# Patient Record
Sex: Female | Born: 1990
Health system: Southern US, Community
[De-identification: ages and names within clinical notes are randomized; demographics above are authoritative.]

## PROBLEM LIST (undated history)

## (undated) ENCOUNTER — Inpatient Hospital Stay (HOSPITAL_COMMUNITY): Payer: Self-pay

## (undated) DIAGNOSIS — B999 Unspecified infectious disease: Secondary | ICD-10-CM

## (undated) DIAGNOSIS — I10 Essential (primary) hypertension: Secondary | ICD-10-CM

## (undated) DIAGNOSIS — U071 COVID-19: Secondary | ICD-10-CM

## (undated) DIAGNOSIS — K3184 Gastroparesis: Secondary | ICD-10-CM

## (undated) DIAGNOSIS — F329 Major depressive disorder, single episode, unspecified: Secondary | ICD-10-CM

## (undated) DIAGNOSIS — T7840XA Allergy, unspecified, initial encounter: Secondary | ICD-10-CM

## (undated) DIAGNOSIS — E1065 Type 1 diabetes mellitus with hyperglycemia: Secondary | ICD-10-CM

## (undated) DIAGNOSIS — M797 Fibromyalgia: Secondary | ICD-10-CM

## (undated) DIAGNOSIS — F419 Anxiety disorder, unspecified: Secondary | ICD-10-CM

## (undated) DIAGNOSIS — Z5189 Encounter for other specified aftercare: Secondary | ICD-10-CM

## (undated) DIAGNOSIS — D649 Anemia, unspecified: Secondary | ICD-10-CM

## (undated) DIAGNOSIS — F32A Depression, unspecified: Secondary | ICD-10-CM

## (undated) DIAGNOSIS — H269 Unspecified cataract: Secondary | ICD-10-CM

## (undated) DIAGNOSIS — K219 Gastro-esophageal reflux disease without esophagitis: Secondary | ICD-10-CM

## (undated) HISTORY — DX: Anemia, unspecified: D64.9

## (undated) HISTORY — DX: Gastro-esophageal reflux disease without esophagitis: K21.9

## (undated) HISTORY — PX: WISDOM TOOTH EXTRACTION: SHX21

## (undated) HISTORY — DX: Major depressive disorder, single episode, unspecified: F32.9

## (undated) HISTORY — PX: AMPUTATION: SHX166

## (undated) HISTORY — DX: Fibromyalgia: M79.7

## (undated) HISTORY — DX: Allergy, unspecified, initial encounter: T78.40XA

## (undated) HISTORY — PX: ANKLE SURGERY: SHX546

## (undated) HISTORY — PX: COLONOSCOPY: SHX174

## (undated) HISTORY — DX: Unspecified cataract: H26.9

## (undated) HISTORY — DX: Encounter for other specified aftercare: Z51.89

## (undated) HISTORY — PX: UPPER GASTROINTESTINAL ENDOSCOPY: SHX188

## (undated) HISTORY — DX: Depression, unspecified: F32.A

## (undated) HISTORY — PX: SIGMOIDOSCOPY: SUR1295

---
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
## 2009-01-03 ENCOUNTER — Telehealth (INDEPENDENT_AMBULATORY_CARE_PROVIDER_SITE_OTHER): Payer: Self-pay | Admitting: *Deleted

## 2009-01-04 ENCOUNTER — Telehealth: Payer: Self-pay | Admitting: Family Medicine

## 2009-01-26 ENCOUNTER — Encounter: Payer: Self-pay | Admitting: Family Medicine

## 2009-01-26 ENCOUNTER — Encounter: Payer: Self-pay | Admitting: Internal Medicine

## 2009-01-26 ENCOUNTER — Encounter (INDEPENDENT_AMBULATORY_CARE_PROVIDER_SITE_OTHER): Payer: Self-pay | Admitting: *Deleted

## 2009-01-26 LAB — CONVERTED CEMR LAB
Cholesterol: 133 mg/dL
HDL: 57 mg/dL
LDL Cholesterol: 62 mg/dL
Triglycerides: 72 mg/dL

## 2009-02-02 ENCOUNTER — Encounter (INDEPENDENT_AMBULATORY_CARE_PROVIDER_SITE_OTHER): Payer: Self-pay | Admitting: *Deleted

## 2009-08-10 ENCOUNTER — Encounter: Payer: Self-pay | Admitting: Internal Medicine

## 2009-08-10 ENCOUNTER — Encounter: Payer: Self-pay | Admitting: Family Medicine

## 2009-08-10 ENCOUNTER — Encounter (INDEPENDENT_AMBULATORY_CARE_PROVIDER_SITE_OTHER): Payer: Self-pay | Admitting: *Deleted

## 2009-08-10 LAB — CONVERTED CEMR LAB: Hgb A1c MFr Bld: 14.8 %

## 2009-08-13 ENCOUNTER — Encounter (INDEPENDENT_AMBULATORY_CARE_PROVIDER_SITE_OTHER): Payer: Self-pay | Admitting: *Deleted

## 2009-12-23 ENCOUNTER — Emergency Department (HOSPITAL_COMMUNITY): Admission: EM | Admit: 2009-12-23 | Discharge: 2009-12-23 | Payer: Self-pay | Admitting: Emergency Medicine

## 2010-01-01 ENCOUNTER — Ambulatory Visit: Payer: Self-pay | Admitting: Internal Medicine

## 2010-01-01 DIAGNOSIS — K5900 Constipation, unspecified: Secondary | ICD-10-CM | POA: Insufficient documentation

## 2010-01-01 DIAGNOSIS — K6289 Other specified diseases of anus and rectum: Secondary | ICD-10-CM | POA: Insufficient documentation

## 2010-01-04 ENCOUNTER — Telehealth (INDEPENDENT_AMBULATORY_CARE_PROVIDER_SITE_OTHER): Payer: Self-pay | Admitting: *Deleted

## 2010-02-14 ENCOUNTER — Encounter (INDEPENDENT_AMBULATORY_CARE_PROVIDER_SITE_OTHER): Payer: Self-pay | Admitting: *Deleted

## 2010-02-14 ENCOUNTER — Encounter: Payer: Self-pay | Admitting: Family Medicine

## 2010-02-14 LAB — CONVERTED CEMR LAB: Hgb A1c MFr Bld: 12.2 %

## 2010-02-15 ENCOUNTER — Encounter (INDEPENDENT_AMBULATORY_CARE_PROVIDER_SITE_OTHER): Payer: Self-pay | Admitting: *Deleted

## 2010-04-30 NOTE — Letter (Signed)
Summary: [facility]  [facility]   Imported By: Lanelle Bal 04/05/2008 10:34:52  _____________________________________________________________________  External Attachment:    Type:   Image     Comment:   External Document

## 2010-04-30 NOTE — Progress Notes (Signed)
Summary: VERIFICATION OF DOSAGE  Phone Note Call from Patient   Caller: [facility] on [address] [phone] Summary of Call: pt is in store now, to lpickup LEVAQUIN RX SAYS 500MG  two times a day , NEVERG VIEN two times a day A HIGH DOSE DOSE, WE NEED TO VERIFY BEFORE GIVEN TO PT  Follow-up for Phone Call        spoke with pharm it was changed to once daily.  Follow-up by: Army Fossa CMA,  January 03, 2009 4:35 PM

## 2010-04-30 NOTE — Letter (Signed)
Summary: [facility]  [facility]   Imported By: Lanelle Bal 09/27/2008 15:42:40  _____________________________________________________________________  External Attachment:    Type:   Image     Comment:   External Document

## 2010-04-30 NOTE — Miscellaneous (Signed)
  Clinical Lists Changes  Observations: Added new observation of TD BOOSTER: Historical (10/05/2003 15:12)      Tetanus/Td Immunization History:    Tetanus/Td # 1:  Historical (10/05/2003)

## 2010-04-30 NOTE — Assessment & Plan Note (Signed)
Summary: SORE ON ARM PT DIABETIC/ALR   Vital Signs:  Patient profile:   20 year old female Height:      66.75 inches Weight:      173.25 pounds BMI:     27.44 Temp:     98.3 degrees F oral Pulse rate:   92 / minute Pulse rhythm:   regular BP sitting:   138 / 90  (left arm) Cuff size:   regular  Vitals Entered By: Allyn Kenner CMA (December 29, 2008 3:49 PM) CC: sore on right forearm x 1 week.    History of Present Illness: Pt here with mom c/o sore on R forearm x 1 week.  No injury.  Pt had abscess on buttocks a while back and surgeon removed it.   Current Medications (verified): 1)  Metformin Hcl 1000 Mg Tabs (Metformin Hcl) .... Take One Tablet Two Times A Day 2)  Enalapril Maleate 5 Mg Tabs (Enalapril Maleate) .... Take One Tablet Daily 3)  Hydrocodone-Acetaminophen 5-500 Mg Tabs (Hydrocodone-Acetaminophen) .... Take One Tablet Every 6 Hours For Pain 4)  Novolog 100 Unit/ml Soln (Insulin Aspart) .... Sliding Scale Subq As Needed Sliding Scale As Before Meals 5)  Lantus 100 Unit/ml Soln (Insulin Glargine) .... 60 Units At Bedtime 6)  Potassium Phospate 7)  Bactrim Ds 800-160 Mg Tabs (Sulfamethoxazole-Trimethoprim) .Marland Kitchen.. 1 Tablet Two Times A Day  Allergies (verified): No Known Drug Allergies  Past History:  Past medical, surgical, family and social histories (including risk factors) reviewed for relevance to current acute and chronic problems.  Past Medical History: Reviewed history from 02/07/2008 and no changes required. Diabetes type 2  Past Surgical History: Reviewed history from 07/20/2008 and no changes required. 10/06/06-DKA 4/10- DKA  Family History: Reviewed history and no changes required.  Social History: Reviewed history from 02/07/2008 and no changes required. Freshman at [employer] (2009)  Review of Systems      See HPI  Physical Exam  General:  Well-developed,well-nourished,in no acute distress; alert,appropriate and cooperative throughout  examination Skin:  R forearm 2in abscess with surrounding induration and errythema culture done tender to touch Psych:  Oriented X3 and normally interactive.     Impression & Recommendations:  Problem # 1:  ABSCESS, ARM (IZT-245.3)  rocephin given referral coordator called hand surgeon who said to send pt to ER and pt would see Dr Apolonio Schneiders.  Her updated medication list for this problem includes:    Bactrim Ds 800-160 Mg Tabs (Sulfamethoxazole-trimethoprim) .Marland Kitchen... 1 tablet two times a day  Elevate affected area. Warm moist compresses for 20 minutes every 2 hours while awake. Take antibiotics as directed and take acetaminophen as needed. To be seen in 48-72 hours if no improvement, sooner if worse.  Orders: Surgical Referral (Surgery) T-Culture, Wound ([id number]) Admin of Therapeutic Inj  intramuscular or subcutaneous (80998) Rocephin  227m (([id number]  Complete Medication List: 1)  Metformin Hcl 1000 Mg Tabs (Metformin hcl) .... Take one tablet two times a day 2)  Enalapril Maleate 5 Mg Tabs (Enalapril maleate) .... Take one tablet daily 3)  Hydrocodone-acetaminophen 5-500 Mg Tabs (Hydrocodone-acetaminophen) .... Take one tablet every 6 hours for pain 4)  Novolog 100 Unit/ml Soln (Insulin aspart) .... Sliding scale subq as needed sliding scale as before meals 5)  Lantus 100 Unit/ml Soln (Insulin glargine) .... 60 units at bedtime 6)  Potassium Phospate  7)  Bactrim Ds 800-160 Mg Tabs (Sulfamethoxazole-trimethoprim) ..Marland Kitchen. 1 tablet two times a day Prescriptions: BACTRIM DS 800-160 MG TABS (SULFAMETHOXAZOLE-TRIMETHOPRIM) 1 tablet  two times a day  #20 x 1   Entered and Authorized by:   Garnet Koyanagi DO   Signed by:   Garnet Koyanagi DO on 12/29/2008   Method used:   Electronically to        [facility] (retail)       [address]       [place], [place]  [address]       Ph: [phone]       Fax: [phone]   RxID:   [id number]    Medication  Administration  Injection # 1:    Medication: Rocephin  269m    Diagnosis: ABSCESS, ARM ([id number])    Route: IM    Site: RUOQ gluteus    Exp Date: 06/2011    Lot #: [id number]   Mfr: NOVAPLUS    Comments: Pt received 1 gm     Patient tolerated injection without complications    Given by: DAllyn KennerCMA (December 29, 2008 4:37 PM)  Orders Added: 1)  Surgical Referral [Surgery] 2)  T-Culture, Wound [[id number]] 3)  Admin of Therapeutic Inj  intramuscular or subcutaneous [96372] 4)  Rocephin  2532m[[id number]] 5)  Est. Patient Level III [[id number][[id number]]

## 2010-04-30 NOTE — Miscellaneous (Signed)
  Clinical Lists Changes  Observations: Added new observation of TRIGLYC TOT: 72 mg/dL (44/03/270 53:66) Added new observation of LDL: 62 mg/dL (44/05/4740 59:56) Added new observation of HDL: 57 mg/dL (38/75/6433 29:51) Added new observation of CHOLESTEROL: 133 mg/dL (88/41/6606 30:16)

## 2010-04-30 NOTE — Consult Note (Signed)
Summary: [facility]-OFFICE PROGRESS NOTE  [facility]-OFFICE PROGRESS NOTE   Imported By: Freddy Jaksch 02/22/2007 17:19:01  _____________________________________________________________________  External Attachment:    Type:   Image     Comment:   INTER

## 2010-04-30 NOTE — Assessment & Plan Note (Signed)
Summary: followup from hospital/alr   Vital Signs:  Patient profile:   20 year old female Weight:      192.4 pounds Pulse rate:   114 / minute BP sitting:   134 / 80  (left arm)  Vitals Entered By: Doristine Devoid (July 20, 2008 2:59 PM) CC: hosp f/u and recheck abscess on right buttocks   History of Present Illness: 20 yo girl recently d/c'd from [facility] hospital for DKA on 4/16.  was not taking insulin.  pt still following w/ endo at [facility] but having difficulty dealing w/ DM while in college.  pt w/ abscess in gluteal cleft.  on Bactrim.  packing in place.  pt has home health changing packing every other day and mom doing it on the off days.  no surrounding redness or induration per mom.  pain much less. still has pain meds left.   Current Medications (verified): 1)  Metformin Hcl 1000 Mg Tabs (Metformin Hcl) .... Take One Tablet Two Times A Day 2)  Enalapril Maleate 5 Mg Tabs (Enalapril Maleate) .... Take One Tablet Daily 3)  Hydrocodone-Acetaminophen 5-500 Mg Tabs (Hydrocodone-Acetaminophen) .... Take One Tablet Every 6 Hours For Pain 4)  Novolog 100 Unit/ml Soln (Insulin Aspart) .... Sliding Scale Subq As Needed Sliding Scale As Before Meals 5)  Lantus 100 Unit/ml Soln (Insulin Glargine) .... 60 Units At Bedtime 6)  Potassium Phospate 7)  Bactrim Ds 800-160 Mg Tabs (Sulfamethoxazole-Trimethoprim) .Marland Kitchen.. 1 Tablet Two Times A Day  Allergies (verified): No Known Drug Allergies  Past History:  Past Medical History:    Diabetes type 2 (02/07/2008)  Past Surgical History:    10/06/06-DKA    4/10- DKA  Review of Systems General:  Denies chills, fatigue, fever, loss of appetite, and malaise. Eyes:  Denies blurring and double vision. CV:  Denies chest pain or discomfort, lightheadness, near fainting, palpitations, shortness of breath with exertion, swelling of feet, and swelling of hands. GI:  Denies abdominal pain, nausea, and vomiting. Derm:  Complains of lesion(s). Psych:   Denies anxiety and depression. Endo:  Denies excessive hunger, excessive thirst, and excessive urination.  Physical Exam  General:  Well-developed,well-nourished,in no acute distress; alert,appropriate and cooperative throughout examination Skin:  1-1.5 cm opening at gluteal cleft.  no surrounding erythema or induration.  bottom of wound not visible.   Impression & Recommendations:  Problem # 1:  DIABETES MELLITUS, TYPE II (ICD-250.00) Assessment Unchanged had long discussion w/ pt (37 minutes) regarding the importance of taking care of her diabetes despite being independent and wanting to 'be normal'.  after this hospitalization pt states she is now aware that she needs to take better care of herself.  reports taking insulin daily as directed.  now wants career in health care- stressed she needs to take care of herself before caring for others.  pt understands.  more than 50% of visit spent counseling. Her updated medication list for this problem includes:    Metformin Hcl 1000 Mg Tabs (Metformin hcl) .Marland Kitchen... Take one tablet two times a day    Enalapril Maleate 5 Mg Tabs (Enalapril maleate) .Marland Kitchen... Take one tablet daily    Novolog 100 Unit/ml Soln (Insulin aspart) ..... Sliding scale subq as needed sliding scale as before meals    Lantus 100 Unit/ml Soln (Insulin glargine) .Marland KitchenMarland KitchenMarland KitchenMarland Kitchen 60 units at bedtime  Problem # 2:  CELLULITIS/ABSCESS, BUTTOCK (ICD-682.5) Assessment: New area cleaned w/ normal saline and packed w/ sterile saline gauze.  stressed importance of finishing abx.  reviewed red  flags that should prompt return.  pt and mother express understanding. Her updated medication list for this problem includes:    Bactrim Ds 800-160 Mg Tabs (Sulfamethoxazole-trimethoprim) .Marland Kitchen... 1 tablet two times a day  Complete Medication List: 1)  Metformin Hcl 1000 Mg Tabs (Metformin hcl) .... Take one tablet two times a day 2)  Enalapril Maleate 5 Mg Tabs (Enalapril maleate) .... Take one tablet daily 3)   Hydrocodone-acetaminophen 5-500 Mg Tabs (Hydrocodone-acetaminophen) .... Take one tablet every 6 hours for pain 4)  Novolog 100 Unit/ml Soln (Insulin aspart) .... Sliding scale subq as needed sliding scale as before meals 5)  Lantus 100 Unit/ml Soln (Insulin glargine) .... 60 units at bedtime 6)  Potassium Phospate  7)  Bactrim Ds 800-160 Mg Tabs (Sulfamethoxazole-trimethoprim) .Marland Kitchen.. 1 tablet two times a day  Patient Instructions: 1)  Please start taking care of yourself!  This is VERY important! 2)  Finish your antibiotics! 3)  Continue to keep the abscess area clean and dry 4)  Tylenol or ibuprofen as needed for pain- save the hydrocodone for severe pain 5)  Call with any questions or concerns 6)  Remember- you have to take care of you in order to take care of others!

## 2010-04-30 NOTE — Letter (Signed)
Summary: [facility]--CHEMISTRY RESULT  [facility]--CHEMISTRY RESULT   Imported By: Freddy Jaksch 02/22/2007 17:18:14  _____________________________________________________________________  External Attachment:    Type:   Image     Comment:   External Document

## 2010-04-30 NOTE — Assessment & Plan Note (Signed)
Summary: very constipated//kn   Vital Signs:  Patient profile:   20 year old female Height:      66.75 inches Weight:      158.25 pounds BMI:     25.06 Pulse rate:   152 / minute Pulse rhythm:   regular BP sitting:   126 / 82  (left arm) Cuff size:   regular  Vitals Entered By: Army Fossa CMA (January 01, 2010 3:20 PM) CC: Pt here for severe constipation. Comments Had a bowel movement yesterday, very painful Has been on percocet. Pharm- [facility] [address]   History of Present Illness: approximately 10 days ago went to the ER with a vaginal infection, she was prescribed antibiotics and Percocet due to pain. She took Percocet for 4 days then the  prescription ran out Vaginal infection better 5 days ago she noticed she was  constipated, symptoms progressively worse,having some pain with BMs.  Last night, she had a very small BM, very hard and for the first time she saw some fresh blood in the bowl . This morning she has not been able to have a BM, she feels like she could but she is holding it because of the pain.  ROS No history of previous constipation No abdominal pain No fever No nausea vomiting not on birth control pills, last menstrual period approximately 10 days ago  Current Medications (verified): 1)  Metformin Hcl 1000 Mg Tabs (Metformin Hcl) .... Take One Tablet Two Times A Day 2)  Enalapril Maleate 5 Mg Tabs (Enalapril Maleate) .... Take One Tablet Daily 3)  Novolog 100 Unit/ml Soln (Insulin Aspart) .... Sliding Scale Subq As Needed Sliding Scale As Before Meals 4)  Lantus 100 Unit/ml Soln (Insulin Glargine) .... 60 Units At Bedtime  Allergies (verified): No Known Drug Allergies  Past History:  Past Medical History: Diabetes type 2-- per endocrinology  Past Surgical History: Reviewed history from 07/20/2008 and no changes required. 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   Abdomen:  soft, non-tender, no distention, no masses, no guarding, and no rigidity.   Rectal:  no external hemorrhoids noted. No active bleeding. Digital rectal exam show a normal sphincter tone, no mass noted, stools were brown,soft and  non-bloody. The patient had severe pain throughout the examination   Impression & Recommendations:  Problem # 1:  RECTAL PAIN (ZOX-096.04) severe rectal pain in the setting of recent constipation likely due to painkillers On the rectal examination, I did not see any hemorrhoids, no mass or obvious abscess. The patient was complaining of pain consequently I did not proceed with an anoscopy. Suspect she has a fissure. Plan, see instructions  Problem # 2:  CONSTIPATION (ICD-564.00) see instructions  Complete Medication List: 1)  Metformin Hcl 1000 Mg Tabs (Metformin hcl) .... Take one tablet two times a day 2)  Enalapril Maleate 5 Mg Tabs (Enalapril maleate) .... Take one tablet daily 3)  Novolog 100 Unit/ml Soln (Insulin aspart) .... Sliding scale subq as needed sliding scale as before meals 4)  Lantus 100 Unit/ml Soln (Insulin glargine) .... 60 units at bedtime  Patient Instructions: 1)  no more pain medicine 2)  MiraLax over-the-counter 17 g daily every day for 10 days 3)  You may need to use milk of magnesia over-the-counter 10 cc 3 times a day as needed as well if the MiraLax is not working 24 hours  4)  Get over the counter nupercainal and apply it in and around the anus 3 times a day for the next 10 days 5)  call or go to the ER if you have abdominal pain, fever, nausea vomiting or you are not  better in the next few days

## 2010-04-30 NOTE — Letter (Signed)
Summary: External Correspondence-FOLLOW UP   External Correspondence-FOLLOW UP   Imported By: Freddy Jaksch 10/27/2006 09:48:08  _____________________________________________________________________  External Attachment:    Type:   Image     Comment:   External Document

## 2010-04-30 NOTE — Progress Notes (Signed)
Summary: Verify meds   Phone Note Call from Patient   Summary of Call: Would like Marda to take the [place] for 10 days? And just once a day correct? She is seeing [facility] ortho daily and they are doing water therapy on her wound.  Initial call taken by: Fabens,  January 04, 2009 8:00 AM  Follow-up for Phone Call        did they change the medication---if not then she should take levaquin.    Follow-up by: Garnet Koyanagi DO,  January 04, 2009 10:08 AM  Additional Follow-up for Phone Call Additional follow up Details #1::        medication changed.  Additional Follow-up by: Allyn Kenner CMA,  January 04, 2009 10:27 AM    New/Updated Medications: LEVAQUIN 500 MG TABS (LEVOFLOXACIN) 1 by mouth once daily.

## 2010-04-30 NOTE — Progress Notes (Signed)
  Phone Note Outgoing Call   Summary of Call: Spoke with pt, she is doing better. She is being seen at [facility] ortho. Medication changed levaquin 500mg .     New/Updated Medications: LEVAQUIN 500 MG TABS (LEVOFLOXACIN) 1 by mouth two times a day Prescriptions: LEVAQUIN 500 MG TABS (LEVOFLOXACIN) 1 by mouth two times a day  #20 x 0   Entered by:   Army Fossa CMA   Authorized by:   Loreen Freud DO   Signed by:   Army Fossa CMA on 01/03/2009   Method used:   Electronically to        [facility] (retail)       [address]       [place], [place]  [address]       Ph: [phone]       Fax: [phone]   RxID:   [id number]

## 2010-04-30 NOTE — Letter (Signed)
Summary: [facility]--Office Visit  [facility]--Office Visit   Imported By: Freddy Jaksch 05/28/2007 11:44:20  _____________________________________________________________________  External Attachment:    Type:   Image     Comment:   External Document

## 2010-04-30 NOTE — Miscellaneous (Signed)
  Clinical Lists Changes  Observations: Added new observation of HGBA1C: 14.8 % (08/10/2009 10:52)

## 2010-04-30 NOTE — Letter (Signed)
Summary: endocrinology------[facility]  [facility]   Imported By: Freddy Jaksch 10/05/2007 09:17:01  _____________________________________________________________________  External Attachment:    Type:   Image     Comment:   External Document

## 2010-04-30 NOTE — Assessment & Plan Note (Signed)
Summary: acute only for flu like symptom//ph   Vital Signs:  Patient Profile:   20 Years Old Female Weight:      234.6 pounds Temp:     99.2 degrees F oral BP sitting:   120 / 72  (left arm)  Vitals Entered By: Doristine Devoid (February 07, 2008 1:21 PM)                 Chief Complaint:  fever body aches and chills x3 days .  History of Present Illness: 20 yo girl w/ HA, alternating subjective fevers and chills Saturday night.  Tmax 102.4  + fatigue.  + body aches.  No N/V.  + cough- productive of clear mucus.  + nasal congestion.  + sick contacts at college, confirmed flu.  Denies SOB.    Current Allergies: No known allergies   Past Medical History:    Diabetes type 2  Past Surgical History:    10/06/06-DKA   Social History:    Freshman at [employer] (2009)    Review of Systems      See HPI   Physical Exam  General:     Well-developed,well-nourished,in no acute distress; alert,appropriate and cooperative throughout examination Head:     Normocephalic and atraumatic without obvious abnormalities. No apparent alopecia or balding. Eyes:     no injection or inflammation Ears:     External ear exam shows no significant lesions or deformities.  Otoscopic examination reveals clear canals, tympanic membranes are intact bilaterally without bulging, retraction, inflammation or discharge. Hearing is grossly normal bilaterally. Nose:     clear rhinorrhea Mouth:     post pharynx red, no tonsilar erythema or exudates Neck:     No deformities, masses, or tenderness noted. Lungs:     Normal respiratory effort, chest expands symmetrically. Lungs are clear to auscultation, no crackles or wheezes. Heart:     tachy S1/S2, no M/R/G    Impression & Recommendations:  Problem # 1:  INFLUENZA (ICD-487.8) Assessment: New Pt w/ + flu test in office.  As sxs began <48 hrs ago will start Tamiflu.  Reviewed supportive care and red flags that should prompt return.  Pt  expresses understanding and is in agreement w/ this plan. Her updated medication list for this problem includes:    Tamiflu 75 Mg Caps (Oseltamivir phosphate) .Marland Kitchen... 1 tab by mouth two times a day x 5 days  Orders: Flu A+B ([address])   Complete Medication List: 1)  Metformin Hcl 1000 Mg Tabs (Metformin hcl) .... Take one tablet two times a day 2)  Enalapril  .... Unsure of dose 3)  Tamiflu 75 Mg Caps (Oseltamivir phosphate) .Marland Kitchen.. 1 tab by mouth two times a day x 5 days   Patient Instructions: 1)  You have the Flu 2)  Take the Tamiflu as directed 3)  Drink LOTS of fluid! 4)  Take Tylenol and/or Ibuprofen as needed for fevers and body aches 5)  If you develop any breathing problems- please call the office and/or go to ER right away!   Prescriptions: TAMIFLU 75 MG CAPS (OSELTAMIVIR PHOSPHATE) 1 tab by mouth two times a day x 5 days  #10 x 0   Entered and Authorized by:   Neena Rhymes MD   Signed by:   Neena Rhymes MD on 02/07/2008   Method used:   Print then Give to Patient   RxID:   [id number]  ] Laboratory Results    Other Tests  Influenza A: positive Influenza B:  positive

## 2010-04-30 NOTE — Letter (Signed)
Summary: External Correspondence-[facility]  External Correspondence-[facility]   Imported By: Vanessa Swaziland 02/09/2007 10:56:49  _____________________________________________________________________  External Attachment:    Type:   Image     Comment:   External Document

## 2010-04-30 NOTE — Progress Notes (Signed)
Summary: Checking on pt (lmom 10/7, 10/10, 10/12)  ---- Converted from flag ---- ---- 01/02/2010 5:27 PM, Jose E. Paz MD wrote: please check on the patient, doing better? ------------------------------  Left message for pt to call back. Army Fossa CMA  January 04, 2010 10:37 AM  Left message for pt to call back. Army Fossa CMA  January 07, 2010 3:05 PM  Left message for pt to call back. Army Fossa CMA  January 09, 2010 2:21 PM  Pt never returned call. Army Fossa CMA  January 10, 2010 10:42 AM

## 2010-04-30 NOTE — Miscellaneous (Signed)
  Clinical Lists Changes  Observations: Added new observation of HGBA1C: 12.2 % (02/14/2010 9:20)  Appended Document:     Clinical Lists Changes  Observations: Added new observation of BILI TOTAL: 0.4 mg/dL (37/12/6267 48:54) Added new observation of ALK PHOS: 85 units/L (02/15/2010 11:19) Added new observation of SGPT (ALT): 16 units/L (02/15/2010 11:19) Added new observation of SGOT (AST): 20 units/L (02/15/2010 11:19) Added new observation of PROTEIN, TOT: 0.4 g/dL (62/70/3500 93:81) Added new observation of ALBUMIN: 3.8 g/dL (82/99/3716 96:78) Added new observation of CALCIUM: 9.6 mg/dL (93/81/0175 10:25) Added new observation of GLUCOSE SER: 138 mg/dL (85/27/7824 23:53) Added new observation of CREATININE: 0.5 mg/dL (61/44/3154 00:86) Added new observation of BUN: 9 mg/dL (76/19/5093 26:71) Added new observation of CO2 TOTAL: 27 mmol/L (02/15/2010 11:19) Added new observation of CHLORIDE: 101 mmol/L (02/15/2010 11:19) Added new observation of POTASSIUM: 3.0 mmol/L (02/15/2010 11:19) Added new observation of SODIUM: 137 mmol/L (02/15/2010 11:19) Added new observation of TRIGLYC TOT: 74 mg/dL (24/58/0998 33:82) Added new observation of LDL: 111 mg/dL (50/53/9767 34:19) Added new observation of HDL: 72 mg/dL (37/90/2409 73:53) Added new observation of CHOLESTEROL: 198 mg/dL (29/92/4268 34:19)

## 2010-06-04 ENCOUNTER — Telehealth: Payer: Self-pay | Admitting: Family Medicine

## 2010-06-04 DIAGNOSIS — E1065 Type 1 diabetes mellitus with hyperglycemia: Secondary | ICD-10-CM | POA: Insufficient documentation

## 2010-06-04 DIAGNOSIS — E108 Type 1 diabetes mellitus with unspecified complications: Secondary | ICD-10-CM

## 2010-06-11 NOTE — Progress Notes (Signed)
Summary: Refill--Lantus  Phone Note Refill Request Message from:  Patient on June 04, 2010 1:27 PM  Refills Requested: Medication #1:  LANTUS 100 UNIT/ML SOLN 60 units at bedtime. expres scripts - 90 day supply  Initial call taken by: Okey Regal Spring,  June 04, 2010 1:29 PM  Follow-up for Phone Call        This patient last saw you in 2010. Please advise. Lucious Groves CMA  June 05, 2010 8:52 AM   Additional Follow-up for Phone Call Additional follow up Details #1::        she should get her diabetes meds from [facility] b/c they are the ones treating her diabetes Additional Follow-up by: Neena Rhymes MD,  June 05, 2010 9:07 AM  New Problems: DIAB W/UNSPEC COMP TYPE I [JUV TYPE] UNCNTRL (ICD-250.93)   Additional Follow-up for Phone Call Additional follow up Details #2::    No call back number listed, I called home number and spoke with her mother.  Mother is aware that patient will be referred to endocrinology, but needs med in the mean time. She notes that the patient will want to see a woman MD only. Please advise. Lucious Groves CMA  June 05, 2010 9:58 AM   Additional Follow-up for Phone Call Additional follow up Details #3:: Details for Additional Follow-up Action Taken: will refer to female endo at pt's request but given her difficult to control type I diabetes I will not be able to treat her for this.  will provide 1 month supply of meds at local pharmacy so that she can get appt.  please send med to local pharmacy once mom identifies one. Additional Follow-up by: Neena Rhymes MD,  June 05, 2010 11:06 AM  New Problems: DIAB W/UNSPEC COMP TYPE I [JUV TYPE] UNCNTRL (ICD-250.93) Prescriptions: LANTUS 100 UNIT/ML SOLN (INSULIN GLARGINE) 60 units at bedtime  #1 month x 0   Entered by:   Lucious Groves CMA   Authorized by:   Neena Rhymes MD   Signed by:   Lucious Groves CMA on 06/05/2010   Method used:   Electronically to        [facility] (retail)       [address]       [place], [place]  [address]       Ph: [phone]       Fax: [phone]   RxID:   [id number]  Mom notified and states she will schedule endo appt herself. Lucious Groves CMA  June 05, 2010 11:51 AM

## 2010-06-13 LAB — BASIC METABOLIC PANEL
BUN: 4 mg/dL — ABNORMAL LOW (ref 6–23)
CO2: 20 mEq/L (ref 19–32)
Calcium: 9 mg/dL (ref 8.4–10.5)
Chloride: 97 mEq/L (ref 96–112)
Creatinine, Ser: 0.63 mg/dL (ref 0.4–1.2)
GFR calc Af Amer: >60 mL/min (ref >60)
GFR calc non Af Amer: >60 mL/min (ref >60)
Glucose, Bld: 441 mg/dL — ABNORMAL HIGH (ref 70–99)
Potassium: 3.2 mEq/L — ABNORMAL LOW (ref 3.5–5.1)
Sodium: 133 mEq/L — ABNORMAL LOW (ref 135–145)

## 2010-06-13 LAB — WET PREP, GENITAL
Trich, Wet Prep: NONE SEEN
Yeast Wet Prep HPF POC: NONE SEEN

## 2010-06-13 LAB — POCT PREGNANCY, URINE: Preg Test, Ur: NEGATIVE

## 2010-06-13 LAB — GLUCOSE, CAPILLARY
Glucose-Capillary: 256 mg/dL — ABNORMAL HIGH (ref 70–99)
Glucose-Capillary: 394 mg/dL — ABNORMAL HIGH (ref 70–99)

## 2010-07-02 ENCOUNTER — Other Ambulatory Visit: Payer: Self-pay | Admitting: Internal Medicine

## 2010-07-02 DIAGNOSIS — N6312 Unspecified lump in the right breast, upper inner quadrant: Secondary | ICD-10-CM

## 2010-07-02 DIAGNOSIS — N6325 Unspecified lump in the left breast, overlapping quadrants: Secondary | ICD-10-CM

## 2010-07-04 LAB — POCT I-STAT, CHEM 8
BUN: <3 mg/dL — ABNORMAL LOW (ref 6–23)
Calcium, Ion: 1.21 mmol/L (ref 1.12–1.32)
Chloride: 104 mEq/L (ref 96–112)
Creatinine, Ser: 0.5 mg/dL (ref 0.4–1.2)
Glucose, Bld: 424 mg/dL — ABNORMAL HIGH (ref 70–99)
HCT: 41 % (ref 36.0–46.0)
Hemoglobin: 13.9 g/dL (ref 12.0–15.0)
Potassium: 3.1 mEq/L — ABNORMAL LOW (ref 3.5–5.1)
Sodium: 140 mEq/L (ref 135–145)
TCO2: 19 mmol/L (ref 0–100)

## 2010-07-04 LAB — CBC
HCT: 38.8 % (ref 36.0–46.0)
Hemoglobin: 13.3 g/dL (ref 12.0–15.0)
MCHC: 34.4 g/dL (ref 30.0–36.0)
MCV: 92.1 fL (ref 78.0–100.0)
Platelets: 339 10*3/uL (ref 150–400)
RBC: 4.21 MIL/uL (ref 3.87–5.11)
RDW: 15 % (ref 11.5–15.5)
WBC: 8.4 10*3/uL (ref 4.0–10.5)

## 2010-07-04 LAB — DIFFERENTIAL
Basophils Absolute: 0.1 10*3/uL (ref 0.0–0.1)
Basophils Relative: 1 % (ref 0–1)
Eosinophils Absolute: 0.1 10*3/uL (ref 0.0–0.7)
Eosinophils Relative: 1 % (ref 0–5)
Lymphocytes Relative: 16 % (ref 12–46)
Lymphs Abs: 1.4 10*3/uL (ref 0.7–4.0)
Monocytes Absolute: 0.7 10*3/uL (ref 0.1–1.0)
Monocytes Relative: 8 % (ref 3–12)
Neutro Abs: 6.2 10*3/uL (ref 1.7–7.7)
Neutrophils Relative %: 73 % (ref 43–77)

## 2010-07-05 ENCOUNTER — Ambulatory Visit
Admission: RE | Admit: 2010-07-05 | Discharge: 2010-07-05 | Disposition: A | Discharge status: Home / Self Care | Payer: 59 | Source: Ambulatory Visit | Attending: Internal Medicine | Admitting: Internal Medicine

## 2010-07-05 DIAGNOSIS — N6312 Unspecified lump in the right breast, upper inner quadrant: Secondary | ICD-10-CM

## 2010-07-05 DIAGNOSIS — N6325 Unspecified lump in the left breast, overlapping quadrants: Secondary | ICD-10-CM

## 2010-07-08 LAB — GLUCOSE, CAPILLARY
Glucose-Capillary: 106 mg/dL — ABNORMAL HIGH (ref 70–99)
Glucose-Capillary: 115 mg/dL — ABNORMAL HIGH (ref 70–99)
Glucose-Capillary: 116 mg/dL — ABNORMAL HIGH (ref 70–99)
Glucose-Capillary: 122 mg/dL — ABNORMAL HIGH (ref 70–99)
Glucose-Capillary: 124 mg/dL — ABNORMAL HIGH (ref 70–99)
Glucose-Capillary: 125 mg/dL — ABNORMAL HIGH (ref 70–99)
Glucose-Capillary: 132 mg/dL — ABNORMAL HIGH (ref 70–99)
Glucose-Capillary: 137 mg/dL — ABNORMAL HIGH (ref 70–99)
Glucose-Capillary: 142 mg/dL — ABNORMAL HIGH (ref 70–99)
Glucose-Capillary: 142 mg/dL — ABNORMAL HIGH (ref 70–99)
Glucose-Capillary: 144 mg/dL — ABNORMAL HIGH (ref 70–99)
Glucose-Capillary: 149 mg/dL — ABNORMAL HIGH (ref 70–99)
Glucose-Capillary: 165 mg/dL — ABNORMAL HIGH (ref 70–99)
Glucose-Capillary: 174 mg/dL — ABNORMAL HIGH (ref 70–99)
Glucose-Capillary: 181 mg/dL — ABNORMAL HIGH (ref 70–99)
Glucose-Capillary: 190 mg/dL — ABNORMAL HIGH (ref 70–99)
Glucose-Capillary: 191 mg/dL — ABNORMAL HIGH (ref 70–99)
Glucose-Capillary: 207 mg/dL — ABNORMAL HIGH (ref 70–99)
Glucose-Capillary: 216 mg/dL — ABNORMAL HIGH (ref 70–99)
Glucose-Capillary: 228 mg/dL — ABNORMAL HIGH (ref 70–99)
Glucose-Capillary: 229 mg/dL — ABNORMAL HIGH (ref 70–99)
Glucose-Capillary: 231 mg/dL — ABNORMAL HIGH (ref 70–99)
Glucose-Capillary: 238 mg/dL — ABNORMAL HIGH (ref 70–99)
Glucose-Capillary: 240 mg/dL — ABNORMAL HIGH (ref 70–99)
Glucose-Capillary: 242 mg/dL — ABNORMAL HIGH (ref 70–99)
Glucose-Capillary: 261 mg/dL — ABNORMAL HIGH (ref 70–99)
Glucose-Capillary: 268 mg/dL — ABNORMAL HIGH (ref 70–99)
Glucose-Capillary: 274 mg/dL — ABNORMAL HIGH (ref 70–99)
Glucose-Capillary: 284 mg/dL — ABNORMAL HIGH (ref 70–99)
Glucose-Capillary: 288 mg/dL — ABNORMAL HIGH (ref 70–99)
Glucose-Capillary: 289 mg/dL — ABNORMAL HIGH (ref 70–99)
Glucose-Capillary: 295 mg/dL — ABNORMAL HIGH (ref 70–99)
Glucose-Capillary: 306 mg/dL — ABNORMAL HIGH (ref 70–99)
Glucose-Capillary: 315 mg/dL — ABNORMAL HIGH (ref 70–99)
Glucose-Capillary: 385 mg/dL — ABNORMAL HIGH (ref 70–99)
Glucose-Capillary: 390 mg/dL — ABNORMAL HIGH (ref 70–99)
Glucose-Capillary: 426 mg/dL — ABNORMAL HIGH (ref 70–99)
Glucose-Capillary: 474 mg/dL — ABNORMAL HIGH (ref 70–99)

## 2010-07-08 LAB — BASIC METABOLIC PANEL
BUN: 2 mg/dL — ABNORMAL LOW (ref 6–23)
BUN: 4 mg/dL — ABNORMAL LOW (ref 6–23)
BUN: 5 mg/dL — ABNORMAL LOW (ref 6–23)
BUN: 5 mg/dL — ABNORMAL LOW (ref 6–23)
BUN: 5 mg/dL — ABNORMAL LOW (ref 6–23)
BUN: 5 mg/dL — ABNORMAL LOW (ref 6–23)
BUN: 5 mg/dL — ABNORMAL LOW (ref 6–23)
BUN: 6 mg/dL (ref 6–23)
BUN: 8 mg/dL (ref 6–23)
CO2: 14 mEq/L — ABNORMAL LOW (ref 19–32)
CO2: 16 mEq/L — ABNORMAL LOW (ref 19–32)
CO2: 17 mEq/L — ABNORMAL LOW (ref 19–32)
CO2: 17 mEq/L — ABNORMAL LOW (ref 19–32)
CO2: 18 mEq/L — ABNORMAL LOW (ref 19–32)
CO2: 21 mEq/L (ref 19–32)
CO2: 30 mEq/L (ref 19–32)
CO2: 33 mEq/L — ABNORMAL HIGH (ref 19–32)
CO2: 5 mEq/L — CL (ref 19–32)
Calcium: 7.7 mg/dL — ABNORMAL LOW (ref 8.4–10.5)
Calcium: 8.3 mg/dL — ABNORMAL LOW (ref 8.4–10.5)
Calcium: 8.4 mg/dL (ref 8.4–10.5)
Calcium: 8.6 mg/dL (ref 8.4–10.5)
Calcium: 8.7 mg/dL (ref 8.4–10.5)
Calcium: 8.7 mg/dL (ref 8.4–10.5)
Calcium: 8.8 mg/dL (ref 8.4–10.5)
Calcium: 8.9 mg/dL (ref 8.4–10.5)
Calcium: 8.9 mg/dL (ref 8.4–10.5)
Chloride: 106 mEq/L (ref 96–112)
Chloride: 106 mEq/L (ref 96–112)
Chloride: 107 mEq/L (ref 96–112)
Chloride: 109 mEq/L (ref 96–112)
Chloride: 112 mEq/L (ref 96–112)
Chloride: 115 mEq/L — ABNORMAL HIGH (ref 96–112)
Chloride: 116 mEq/L — ABNORMAL HIGH (ref 96–112)
Chloride: 117 mEq/L — ABNORMAL HIGH (ref 96–112)
Chloride: 118 mEq/L — ABNORMAL HIGH (ref 96–112)
Creatinine, Ser: 0.39 mg/dL — ABNORMAL LOW (ref 0.4–1.2)
Creatinine, Ser: 0.42 mg/dL (ref 0.4–1.2)
Creatinine, Ser: 0.49 mg/dL (ref 0.4–1.2)
Creatinine, Ser: 0.51 mg/dL (ref 0.4–1.2)
Creatinine, Ser: 0.56 mg/dL (ref 0.4–1.2)
Creatinine, Ser: 0.6 mg/dL (ref 0.4–1.2)
Creatinine, Ser: 0.67 mg/dL (ref 0.4–1.2)
Creatinine, Ser: 0.7 mg/dL (ref 0.4–1.2)
Creatinine, Ser: 1.05 mg/dL (ref 0.4–1.2)
GFR calc Af Amer: >60 mL/min (ref >60)
GFR calc Af Amer: >60 mL/min (ref >60)
GFR calc Af Amer: >60 mL/min (ref >60)
GFR calc Af Amer: >60 mL/min (ref >60)
GFR calc Af Amer: >60 mL/min (ref >60)
GFR calc Af Amer: >60 mL/min (ref >60)
GFR calc Af Amer: >60 mL/min (ref >60)
GFR calc Af Amer: >60 mL/min (ref >60)
GFR calc Af Amer: >60 mL/min (ref >60)
GFR calc non Af Amer: >60 mL/min (ref >60)
GFR calc non Af Amer: >60 mL/min (ref >60)
GFR calc non Af Amer: >60 mL/min (ref >60)
GFR calc non Af Amer: >60 mL/min (ref >60)
GFR calc non Af Amer: >60 mL/min (ref >60)
GFR calc non Af Amer: >60 mL/min (ref >60)
GFR calc non Af Amer: >60 mL/min (ref >60)
GFR calc non Af Amer: >60 mL/min (ref >60)
GFR calc non Af Amer: >60 mL/min (ref >60)
Glucose, Bld: 117 mg/dL — ABNORMAL HIGH (ref 70–99)
Glucose, Bld: 127 mg/dL — ABNORMAL HIGH (ref 70–99)
Glucose, Bld: 127 mg/dL — ABNORMAL HIGH (ref 70–99)
Glucose, Bld: 138 mg/dL — ABNORMAL HIGH (ref 70–99)
Glucose, Bld: 172 mg/dL — ABNORMAL HIGH (ref 70–99)
Glucose, Bld: 243 mg/dL — ABNORMAL HIGH (ref 70–99)
Glucose, Bld: 288 mg/dL — ABNORMAL HIGH (ref 70–99)
Glucose, Bld: 293 mg/dL — ABNORMAL HIGH (ref 70–99)
Glucose, Bld: 425 mg/dL — ABNORMAL HIGH (ref 70–99)
Potassium: 3 mEq/L — ABNORMAL LOW (ref 3.5–5.1)
Potassium: 3.1 mEq/L — ABNORMAL LOW (ref 3.5–5.1)
Potassium: 3.1 mEq/L — ABNORMAL LOW (ref 3.5–5.1)
Potassium: 3.1 mEq/L — ABNORMAL LOW (ref 3.5–5.1)
Potassium: 3.1 mEq/L — ABNORMAL LOW (ref 3.5–5.1)
Potassium: 3.2 mEq/L — ABNORMAL LOW (ref 3.5–5.1)
Potassium: 3.3 mEq/L — ABNORMAL LOW (ref 3.5–5.1)
Potassium: 3.4 mEq/L — ABNORMAL LOW (ref 3.5–5.1)
Potassium: 3.5 mEq/L (ref 3.5–5.1)
Sodium: 134 mEq/L — ABNORMAL LOW (ref 135–145)
Sodium: 136 mEq/L (ref 135–145)
Sodium: 137 mEq/L (ref 135–145)
Sodium: 137 mEq/L (ref 135–145)
Sodium: 139 mEq/L (ref 135–145)
Sodium: 139 mEq/L (ref 135–145)
Sodium: 140 mEq/L (ref 135–145)
Sodium: 143 mEq/L (ref 135–145)
Sodium: 143 mEq/L (ref 135–145)

## 2010-07-08 LAB — DIFFERENTIAL
Basophils Absolute: 0 10*3/uL (ref 0.0–0.1)
Basophils Absolute: 0.1 10*3/uL (ref 0.0–0.1)
Basophils Absolute: 0.1 10*3/uL (ref 0.0–0.1)
Basophils Relative: 0 % (ref 0–1)
Basophils Relative: 0 % (ref 0–1)
Basophils Relative: 1 % (ref 0–1)
Eosinophils Absolute: 0 10*3/uL (ref 0.0–0.7)
Eosinophils Absolute: 0 10*3/uL (ref 0.0–0.7)
Eosinophils Absolute: 0.2 10*3/uL (ref 0.0–0.7)
Eosinophils Relative: 0 % (ref 0–5)
Eosinophils Relative: 0 % (ref 0–5)
Eosinophils Relative: 4 % (ref 0–5)
Lymphocytes Relative: 14 % (ref 12–46)
Lymphocytes Relative: 52 % — ABNORMAL HIGH (ref 12–46)
Lymphocytes Relative: 6 % — ABNORMAL LOW (ref 12–46)
Lymphs Abs: 1 10*3/uL (ref 0.7–4.0)
Lymphs Abs: 2.7 10*3/uL (ref 0.7–4.0)
Lymphs Abs: 2.9 10*3/uL (ref 0.7–4.0)
Monocytes Absolute: 0.6 10*3/uL (ref 0.1–1.0)
Monocytes Absolute: 1.4 10*3/uL — ABNORMAL HIGH (ref 0.1–1.0)
Monocytes Absolute: 1.7 10*3/uL — ABNORMAL HIGH (ref 0.1–1.0)
Monocytes Relative: 10 % (ref 3–12)
Monocytes Relative: 11 % (ref 3–12)
Monocytes Relative: 7 % (ref 3–12)
Neutro Abs: 1.9 10*3/uL (ref 1.7–7.7)
Neutro Abs: 13.9 10*3/uL — ABNORMAL HIGH (ref 1.7–7.7)
Neutro Abs: 15.5 10*3/uL — ABNORMAL HIGH (ref 1.7–7.7)
Neutrophils Relative %: 33 % — ABNORMAL LOW (ref 43–77)
Neutrophils Relative %: 79 % — ABNORMAL HIGH (ref 43–77)
Neutrophils Relative %: 84 % — ABNORMAL HIGH (ref 43–77)

## 2010-07-08 LAB — COMPREHENSIVE METABOLIC PANEL
ALT: 10 U/L (ref 0–35)
ALT: 13 U/L (ref 0–35)
AST: 11 U/L (ref 0–37)
AST: 20 U/L (ref 0–37)
Albumin: 3.3 g/dL — ABNORMAL LOW (ref 3.5–5.2)
Albumin: 4.5 g/dL (ref 3.5–5.2)
Alkaline Phosphatase: 147 U/L — ABNORMAL HIGH (ref 39–117)
Alkaline Phosphatase: 93 U/L (ref 39–117)
BUN: 11 mg/dL (ref 6–23)
BUN: 9 mg/dL (ref 6–23)
CO2: 5 mEq/L — CL (ref 19–32)
CO2: 8 mEq/L — CL (ref 19–32)
Calcium: 7.8 mg/dL — ABNORMAL LOW (ref 8.4–10.5)
Calcium: 9.6 mg/dL (ref 8.4–10.5)
Chloride: 106 mEq/L (ref 96–112)
Chloride: 120 mEq/L — ABNORMAL HIGH (ref 96–112)
Creatinine, Ser: 1.04 mg/dL (ref 0.4–1.2)
Creatinine, Ser: 1.42 mg/dL — ABNORMAL HIGH (ref 0.4–1.2)
GFR calc Af Amer: 58 mL/min — ABNORMAL LOW (ref >60)
GFR calc Af Amer: >60 mL/min (ref >60)
GFR calc non Af Amer: 48 mL/min — ABNORMAL LOW (ref >60)
GFR calc non Af Amer: >60 mL/min (ref >60)
Glucose, Bld: 288 mg/dL — ABNORMAL HIGH (ref 70–99)
Glucose, Bld: 478 mg/dL — ABNORMAL HIGH (ref 70–99)
Potassium: 3.7 mEq/L (ref 3.5–5.1)
Potassium: 4.6 mEq/L (ref 3.5–5.1)
Sodium: 137 mEq/L (ref 135–145)
Sodium: 139 mEq/L (ref 135–145)
Total Bilirubin: 1.3 mg/dL — ABNORMAL HIGH (ref 0.3–1.2)
Total Bilirubin: 1.6 mg/dL — ABNORMAL HIGH (ref 0.3–1.2)
Total Protein: 6.9 g/dL (ref 6.0–8.3)
Total Protein: 9.7 g/dL — ABNORMAL HIGH (ref 6.0–8.3)

## 2010-07-08 LAB — CBC
HCT: 29.9 % — ABNORMAL LOW (ref 36.0–46.0)
HCT: 41.4 % (ref 36.0–46.0)
HCT: 52 % — ABNORMAL HIGH (ref 36.0–46.0)
Hemoglobin: 10.4 g/dL — ABNORMAL LOW (ref 12.0–15.0)
Hemoglobin: 14 g/dL (ref 12.0–15.0)
Hemoglobin: 17.1 g/dL — ABNORMAL HIGH (ref 12.0–15.0)
MCHC: 32.9 g/dL (ref 30.0–36.0)
MCHC: 33.8 g/dL (ref 30.0–36.0)
MCHC: 34.7 g/dL (ref 30.0–36.0)
MCV: 90.3 fL (ref 78.0–100.0)
MCV: 91.3 fL (ref 78.0–100.0)
MCV: 92.6 fL (ref 78.0–100.0)
Platelets: 183 10*3/uL (ref 150–400)
Platelets: 299 10*3/uL (ref 150–400)
Platelets: 413 10*3/uL — ABNORMAL HIGH (ref 150–400)
RBC: 3.32 MIL/uL — ABNORMAL LOW (ref 3.87–5.11)
RBC: 4.53 MIL/uL (ref 3.87–5.11)
RBC: 5.61 MIL/uL — ABNORMAL HIGH (ref 3.87–5.11)
RDW: 13.3 % (ref 11.5–15.5)
RDW: 13.4 % (ref 11.5–15.5)
RDW: 13.5 % (ref 11.5–15.5)
WBC: 16.6 10*3/uL — ABNORMAL HIGH (ref 4.0–10.5)
WBC: 19.7 10*3/uL — ABNORMAL HIGH (ref 4.0–10.5)
WBC: 5.7 10*3/uL (ref 4.0–10.5)

## 2010-07-08 LAB — BLOOD GAS, ARTERIAL
Acid-base deficit: 29.4 mmol/L — ABNORMAL HIGH (ref 0.0–2.0)
Bicarbonate: 1.6 mEq/L — ABNORMAL LOW (ref 20.0–24.0)
Bicarbonate: 2.1 mEq/L — ABNORMAL LOW (ref 20.0–24.0)
Bicarbonate: 2.3 mEq/L — ABNORMAL LOW (ref 20.0–24.0)
Drawn by: 229971
Drawn by: 229971
Drawn by: 309681
FIO2: 0.21 %
O2 Content: 2 L/min
O2 Content: 3 L/min
O2 Saturation: 97.2 %
O2 Saturation: 98.2 %
O2 Saturation: 98.4 %
Patient temperature: 98.6
Patient temperature: 98.6
Patient temperature: 98.6
TCO2: 1.6 mmol/L (ref 0–100)
TCO2: 2.1 mmol/L (ref 0–100)
TCO2: 2.4 mmol/L (ref 0–100)
pCO2 arterial: 10.9 mmHg — CL (ref 35.0–45.0)
pCO2 arterial: 7.9 mmHg — CL (ref 35.0–45.0)
pCO2 arterial: 8.5 mmHg — CL (ref 35.0–45.0)
pH, Arterial: 6.949 — CL (ref 7.350–7.400)
pH, Arterial: 6.962 — CL (ref 7.350–7.400)
pH, Arterial: 7.023 — CL (ref 7.350–7.400)
pO2, Arterial: 140 mmHg — ABNORMAL HIGH (ref 80.0–100.0)
pO2, Arterial: 146 mmHg — ABNORMAL HIGH (ref 80.0–100.0)
pO2, Arterial: 146 mmHg — ABNORMAL HIGH (ref 80.0–100.0)

## 2010-07-08 LAB — CULTURE, BLOOD (ROUTINE X 2): Culture: NO GROWTH

## 2010-07-08 LAB — LACTIC ACID, PLASMA: Lactic Acid, Venous: 0.8 mmol/L (ref 0.5–2.2)

## 2010-07-08 LAB — URINALYSIS, ROUTINE W REFLEX MICROSCOPIC
Bilirubin Urine: NEGATIVE
Glucose, UA: >1000 mg/dL — AB
Ketones, ur: >80 mg/dL — AB
Leukocytes, UA: NEGATIVE
Nitrite: NEGATIVE
Protein, ur: >300 mg/dL — AB
Specific Gravity, Urine: 1.024 (ref 1.005–1.030)
Urobilinogen, UA: 0.2 mg/dL (ref 0.0–1.0)
pH: 5 (ref 5.0–8.0)

## 2010-07-08 LAB — LIPID PANEL
Cholesterol: 244 mg/dL — ABNORMAL HIGH (ref 0–200)
HDL: 54 mg/dL (ref >39)
LDL Cholesterol: 120 mg/dL — ABNORMAL HIGH (ref 0–99)
Total CHOL/HDL Ratio: 4.5 RATIO
Triglycerides: 349 mg/dL — ABNORMAL HIGH (ref <150)
VLDL: 70 mg/dL — ABNORMAL HIGH (ref 0–40)

## 2010-07-08 LAB — URINE MICROSCOPIC-ADD ON

## 2010-07-08 LAB — HEMOGLOBIN A1C
Hgb A1c MFr Bld: 14 % — ABNORMAL HIGH (ref 4.6–6.1)
Mean Plasma Glucose: 355 mg/dL

## 2010-07-08 LAB — TROPONIN I: Troponin I: 0.02 ng/mL (ref 0.00–0.06)

## 2010-07-08 LAB — CK TOTAL AND CKMB (NOT AT ARMC)
CK, MB: 1.2 ng/mL (ref 0.3–4.0)
Relative Index: INVALID (ref 0.0–2.5)
Total CK: 28 U/L (ref 7–177)

## 2010-07-08 LAB — MAGNESIUM: Magnesium: 1.7 mg/dL (ref 1.5–2.5)

## 2010-07-08 LAB — TSH: TSH: 0.716 u[IU]/mL (ref 0.350–4.500)

## 2010-07-08 LAB — LIPASE, BLOOD: Lipase: 16 U/L (ref 11–59)

## 2010-07-08 LAB — PHOSPHORUS: Phosphorus: 2.1 mg/dL — ABNORMAL LOW (ref 2.3–4.6)

## 2010-07-08 LAB — POCT PREGNANCY, URINE: Preg Test, Ur: NEGATIVE

## 2010-08-13 NOTE — H&P (Signed)
[name], [name]                ACCOUNT NO.:  [id number]   MEDICAL RECORD NO.:  [id number]          PATIENT TYPE:  INP   LOCATION:  0101                         [facility]AC[facility]   PHYSICIAN:  Farris Has, MDDATE OF BIRTH:  03-02-1991   DATE OF ADMISSION:  09/01/2008  DATE OF DISCHARGE:                              HISTORY & PHYSICAL   ATTENDING PHYSICIAN:  Dr. Birdie Riddle.  The patient has an endocrinologist  outside of the system.   CHIEF COMPLAINT:  Abdominal pain, difficulty breathing.   The patient is a 20 year old female with a history of repeated DKAs and  brittle diabetes type 1 diagnosed at 20 years old.  The patient started  to feel poorly on Thursday.  Started to breathe more and more rapidly.  Did complain of some abdominal pain and presented eventually to  emergency department at 3 o'clock in the morning, where her ABG was  noted to be for pH of 6.949 with pCO2 of 7.9, with a bicarb of 8 and  blood glucose of 426, at which point at first critical care was called.  The decision was made to hold off and see if she improves and admit her  to [facility], at which point [facility] physician was called.  On my  evaluation, the patient appeared to be tachypneic, uncomfortable,  shivering, very cold peripherally, somewhat lethargic, although  answering questions.  Appeared to be very toxic and ill, at which point  she was called to admit for ICU bed.   REVIEW OF SYSTEMS:  As best as can be obtained from the family,  unremarkable except for the reported abdominal discomfort.   PAST MEDICAL HISTORY:  Significant for diabetes and hypertension.  Diabetes type unclear.  She had frequent DKAs.  At some point it is  stated in her past medical history it was diabetes type 2, but I wonder  if it is actually truly diabetes type 1.   SOCIAL HISTORY:  The patient lives with her mother.  Does not smoke or  drink, does not abuse drugs.   FAMILY HISTORY:  Noncontributory.   ALLERGIES:  No known drug allergies.   MEDICATIONS:  1. Metformin 1000 mg p.o. b.i.d.  2. Lantus 50 units p.o. daily.  3. Enalapril, dose unknown.  4. NovoLog, the patient 's family thinks 15 units in the morning.   VITALS:  Temperature 97.0, blood pressure was 151/104, pulse 144,  respirations 30, saturating 100% on room air.  The patient appears to be toxic, unwell.  Head nontraumatic, very dry mucous membranes.  LUNGS:  Clear to auscultation anteriorly but very hard to assess given  loud upper story noises and rapid breathing.  HEART:  Regular rate and rhythm but very rapid.  No murmurs could be  appreciated.  ABDOMEN:  Soft, nontender, nondistended.  LOWER EXTREMITIES:  Without clubbing, cyanosis or edema.  NEUROLOGIC:  Unable to perform full neurological exam.  The patient is  answering questions but appears to be somewhat somnolent, although  arousable.   LABORATORY DATA:  ABG 6.949/7.9 pCO2/pO2 140.  White blood cell count  19.7, hemoglobin  17.1, platelets 413.  Glucose 426.  Sodium 137,  potassium 4.6, bicarb 8, glucose 478, BUN 11, creatinine 1.42.  Lipase  16.  Urine pregnancy negative.  UA pending.   No radiological studies were obtained.   ASSESSMENT/PLAN:  This is a 20 year old with either possibly type 2  brittle diabetes versus type 1 diabetes and hypertension.  Presents with  diabetic ketoacidosis.  1. Diabetic ketoacidosis.  Very severe based on severe acidosis, and      the patient appears toxic.  We will admit to intensive care unit.      Critical Care Medicine consultation, appreciated greatly their      help.  We will follow diabetic ketoacidosis protocol with      aggressive intravenous hydration, Glucommander.  We will evaluate      for evidence of infection with urinalysis, chest x-ray.  Also 1 set      of cardiac enzymes, although the patient is very young.  I wonder      if abdominal pain related to diabetic ketoacidosis but if persists,      we will  do abdominal imaging.  2. Leukocytosis, possibly hemoconcentration, and but we will follow.      Consider starting on Rocephin to cover potential urinary tract      infection versus pulmonary process.  If all infectious disease      workup is negative, we will hold and obtain blood cultures.  3. Hypertension.  Hydralazine as needed.  Hold on the enalapril while      her creatinine is elevated.  4. Prophylaxis.  Protonix plus Lovenox.      Farris Has, MD  Electronically Signed     AVD/MEDQ  D:  09/01/2008  T:  09/01/2008  Job:  [id number]   cc:   Annye Asa, M.D.

## 2010-08-13 NOTE — Discharge Summary (Signed)
[name], [name]                ACCOUNT NO.:  [id number]   MEDICAL RECORD NO.:  [id number]          PATIENT TYPE:  INP   LOCATION:  4650                         FACILITY:  [facility]   PHYSICIAN:  Biagio Borg, MD      DATE OF BIRTH:  May 24, 1990   DATE OF ADMISSION:  09/01/2008  DATE OF DISCHARGE:  09/04/2008                               DISCHARGE SUMMARY   DISCHARGE DIAGNOSES:  1. Diabetes mellitus.  2. Recurrent diabetic ketoacidosis due to noncompliance.  3. Hypertension.  4. Transient leukocytosis.  5. Transient hypokalemia.   CONSULTATIONS:  None.   PROCEDURES:  None.   HISTORY AND PHYSICAL:  See that dictated on date of admission.   HOSPITAL COURSE:  [name] is a 20 year old African American female  with a history of repeated DKA, third time this year, per mother who  presented with similar presentation.  She was breathing shallowly and  rapidly, complaining of abdominal pain.  Blood gas showed pH 6.94 and  pCO2 of 79, bicarb of 8, blood glucose of 426.  She was admitted, placed  in the ICU temporarily and treated in the usual fashion with intensive  IV fluids, Glucommander.  Workup for infectious etiology with a  leukocytosis was negative.  Rocephin was discontinued.  She remained  afebrile throughout her hospitalization.  A1c was over 14%, and it  appeared this admission as before that her most likely etiology for  diabetic ketoacidosis was noncompliance with administration of her  medications.  She had been on 50 units of Lantus prior to admission.  This was increased to 65 units, and she was also given sliding scale  insulin.  While hospitalized in the last 24 hours, her CBGs were in the  100s to 200s.  Her acidosis had resolved, and BUN and creatinine were  within normal limits.  As she was ambulatory, eating well, no further  symptoms and metabolic issues resolved, she was felt to have gained  maximum benefit from this hospitalization, and she is to be  discharged  home.  This evaluation was performed with the mother present.  The  patient admits to noncompliance and vows to be better in the future.  It  was also noted she has been through diabetic management and education in  the past and does not want to do this again at this time.   DISPOSITION:  Discharged to home in good condition.  There are no  activity or dietary restrictions except for diabetic diet.   DISCHARGE MEDICATIONS:  1. Lantus 65 units subcutaneously daily and sliding scale insulin      NovoLog as before.  2. Metformin 1000 mg p.o. b.i.d.  3. Enalapril dose as prior to admission.   She has a followup with endocrinology September 13, 2008, already planned,  and she will keep this appointment.   The importance of compliance was stressed to the patient.      Biagio Borg, MD  Electronically Signed     JWJ/MEDQ  D:  09/04/2008  T:  09/04/2008  Job:  [id number]

## 2010-08-16 NOTE — Discharge Summary (Signed)
[place]  Patient:    [name], BLUMBERG Visit Number: [id number] MRN: [id number]          Service Type: PED Location: PEDS [phone] Attending Physician:  Francis Gaines Admit Date:  03/17/2001 Discharge Date: 03/21/2001                             Discharge Summary  PRIMARY CARE PHYSICIAN:  Leone Haven, M.D. at [facility].  FINAL DIAGNOSES: 1. New onset diabetes mellitus. 2. Diabetic ketoacidosis.  HISTORY OF PRESENT ILLNESS:  Please see admission H&P for full details.  ADMISSION LABORATORY DATA:  White blood cell count 7.4, hemoglobin 14.2, hematocrit 41.4, platelets 311.  Sodium 136, potassium 3.5, chloride 111, bicarbonate 7, BUN 10, creatinine 0.9, glucose 339.  Calcium 9.0.  UA greater than 80 ketones, protein 30, glucose greater than 1000, white blood cells 3 to 6, red blood cells 3 to 6.  C-peptide 1.1.  Hemoglobin A1C 10.1.  Glutamic acid decarboxylase less than 1.  Pancreatic eyelet cell antibodies pending. Insulin antibodies pending.  HOSPITAL COURSE:  The patient was admitted with a two week history of polydipsia and polyuria.  The patient had elevated blood sugars since around the second week of November, with a positive family history of non-insulin-dependent diabetes mellitus.  The range of her sugars on her home glucometer had been greater than 500.  She had been seen by her primary care physician, Dr. Leone Haven, and an evaluation was in progress for presumed non-insulin-dependent diabetes.  Prior to admission, she had been acting listless and had slurred speech since two days prior to admission.  She had a 20 pound weight loss in the last six weeks.  #1 - ENDOCRINE:  From an endocrine standpoint she was admitted with the diagnosis of DKA, and started on aggressive fluid rehydration with an insulin drip rate of 0.5 units per kg/hr.  Over her hospitalization, her blood sugars were labile with difficulty  controlling them.  However, during the first two days of hospitalization, her bicarbonate increased on the insulin drip and her acidosis resolved.  It was then necessary to switch her over to subcutaneous insulin.  She was poorly controlled with blood sugars of 279 to 440 on a regimen of 35 units of 70/30, plus a sliding scale with meals, and 35 units of 70/30 at bedtime.  She continued to have moderate ketosis in her urinalysis today.  As a result, it was felt that the sliding scale was adequate, however, the insulin with meals was not.  Therefore, one day prior to discharge she was changed over to 15 units of regular insulin with 30 units of NPH insulin at breakfast, 12 units of regular, and 12 units of NPH at dinner, and a sliding scale for snacks, lunch, and at bedtime.  This regimen seemed to work better for the patient, and on the day of discharge her blood sugars were improved with values of 190 at 0100, 121 at 0700, and 90 at 0900.  #2 - CARDIOVASCULAR:  The patient was admitted with mild hypertension, most likely secondary to the acidosis with a blood pressure of 142/82.  This resolved over the hospitalization without intervention other than IV fluids.  #3 - INFECTIOUS DISEASE:  The patient remained afebrile throughout the entire hospitalization.  #4 - RENAL:  The patients kidney function was normal throughout the hospitalization with moderate ketosis that continued throughout her UA.  INSTRUCTIONS TO PATIENT AND FAMILY:  The patient was instructed on following up with their primary care physician tomorrow, and seeing Dr. Tyson Babinski in endocrinology at 8 a.m. tomorrow as well.  The home regimen was explained to them in great detail, including their need to go to the pharmacy for test strips, syringes, the insulin, and a Sharps container for all of their used syringes.  The family and the patient understood these instructions well, and were also prepared for discharge.  DISCHARGE  MEDICATIONS: 1. Regular insulin 15 units q.a.m. 12 units q.p.m., sliding scale p.r.n. 2. NPH insulin 30 units q.a.m. 30 units q.p.m., sliding scale p.r.n. 3. Sliding scale equal to blood sugar - 150/25= total units of regular. Attending Physician:  Irene Pap B DD:  03/21/01 TD:  03/23/01 Job: [id number]

## 2010-08-16 NOTE — Discharge Summary (Signed)
[name], [name]                ACCOUNT NO.:  [id number]   MEDICAL RECORD NO.:  [id number]          PATIENT TYPE:  INP   LOCATION:  5329                         FACILITY:  [facility]   PHYSICIAN:  Antony Odea, MD    DATE OF BIRTH:  08-18-1990   DATE OF ADMISSION:  05/08/2006  DATE OF DISCHARGE:  05/12/2006                               DISCHARGE SUMMARY   REASON FOR HOSPITALIZATION:  Diabetic ketoacidosis.   SIGNIFICANT FINDINGS:  Salia presented to the ED from her primary care  physician's office with ketones and uncontrolled blood sugars for about  a week.  On admission her pH was 7.17, bicarbonate of 18, with a base  deficit of 20.  Her ketones were greater than 80, and a blood sugar of  366.  The patient presented tachypneic and tachycardic with some  difficulty breathing.  Diabetic ketoacidosis resolution protocol was  initiated with fluid replacement and electrolyte correction which was  eventually achieved over 24 hours using the two-bag method.  Jacarra was  transitioned to her oral medications as per home dose, Metformin 1,000  mg twice per day; Lantus with NovoLog sliding scale was eventually added  to this regimen per her endocrinologist's recommendation.  [employer]'s  hemoglobin J2E at this visit was 10.4.   TREATMENT:  IV fluids and insulin drip as above; Metformin; enalapril 5  mg p.o. once per day.   DISCHARGE DIAGNOSES:  1. Type 2 diabetes mellitus.  2. Diabetic ketoacidosis.   MEDICATIONS ON DISCHARGE:  1. Lantus 30 units at bedtime.  2. NovoLog 25 units according to sliding scale of preprandial blood      sugars as follows:  11 units if blood sugar is between 101 and 150;      12 units if blood sugar is between 151 and 200; 13 units if blood      sugar is between 151 and 200; 13 units if blood sugar is between      201 and 250; 14 units if blood sugar is between 251 and 300; 15      units if blood sugar is between 301 and 350; 16 units if blood      sugar is  between 351 and 400; and 17 units if blood sugar is      greater than 400.  3. Metformin 1,000 mg twice per day  4. Enalapril 5 mg once per day. The.   PENDING RESULTS AND ISSUES TO BE FOLLOWED:  None.   FOLLOW-UP:  Follow up with Dr. Aida Puffer at [facility] on Friday, May 15, 2006, at 11:00 a.m.; and with Dr.  Cletus Gash at [facility] on May 20, 2006 at 9:30 a.m.   CONDITION ON DISCHARGE:  Discharge weight is 100 kg.  Discharge  condition is improved.  This discharge summary was fax'd to the  patient's primary care physician as well as consultant.  The patient was  also seen by the pediatric nutritionist on this admission and received  diabetic education.     ______________________________  Bonney Aid, M.D.    ______________________________  Antony Odea,  MD    Hillard Danker  D:  05/12/2006  T:  05/13/2006  Job:  [id number]

## 2010-08-16 NOTE — Op Note (Signed)
NAMEOMOLARA, CAROL                ACCOUNT NO.:  [id number]   MEDICAL RECORD NO.:  [id number]          PATIENT TYPE:  AMB   LOCATION:  [facility]ESC                         FACILITY:  [facility]   PHYSICIAN:  Tarri Glenn, M.D.  DATE OF BIRTH:  1990/04/23   DATE OF PROCEDURE:  03/01/2004  DATE OF DISCHARGE:                                 OPERATIVE REPORT   PREOPERATIVE DIAGNOSES:  Closed displaced bimalleolar component of  trimalleolar fracture right ankle.   POSTOPERATIVE DIAGNOSES:  Closed displaced bimalleolar component of  trimalleolar fracture right ankle.   OPERATION:  ORIF distal fibular and medial malleolar fractures right ankle.   SURGEON:  Tarri Glenn, M.D.   ASSISTANT:  Mr. Delorse Lek, P.A.-C.   ANESTHESIA:  General.   PATHOLOGY AND JUSTIFICATION FOR PROCEDURE:  Original injury was on February 20, 2004.  I saw her in my office on February 23, 2004. She was scheduled  electively for the above mentioned surgery today so as not to miss school.  She had a small posterior malleolar fracture which did not involve the  articular surface and was not felt to require open reduction. She had a long  spiral fracture of the distal fibula above the mortis and a fracture of the  medial malleolus distal to the mortis with displacement.   DESCRIPTION OF PROCEDURE:  Prophylactic antibiotics, satisfactory general  anesthesia, pneumatic tourniquet, leg was esmarched out nonsterilely. The  right lower extremity from just past the knee to toes was prepped with  DuraPrep, draped in a sterile field, C-arm employed.  I first made a lateral  incision over the distal fibular fracture site. She had a long spiral  fracture and after analyzing it, I found that a seven hole plate would seem  to give adequate coverage.  Consequently after reducing the fracture and  holding it with a clamp, I placed the plate over the fracture site and  incorporated both the plate and the fracture and then individually  drilled,  measured and screwed the seven holes with anatomic position of the fracture  and good position of the screws confirmed with the C-arm.  I then went  medially. She had a good bit of swelling. I used the C-arm to make my  initial incision. The fracture site was identified and cleared of clotted  material.  The fracture was reduced and a guidepin for the 4-0 cannulated  screw was then placed and when found to be in good position I overdrilled it  and placed a 50 mm cannulated screw anatomically stabilizing the fracture on  AP and lateral C-arm x-rays.  Both wounds were then irrigated sterile saline  medially. The periosteal deltoid ligament complex was closed with  interrupted #0 Vicryl, subcutaneous tissue with 2-0 Vicryl, skin with  interrupted 3-0 nylon mattress sutures. Laterally the fascia was closed with  interrupted #0 Vicryl, 2-0 Vicryl in the subcutaneous tissue and 3-0 nylon  in the skin. Betadine Adaptic dry sterile dressing were  applied. During the case, the tourniquet was released at 2 hours of  tourniquet time.  There was no unusual bleeding on  applying the short leg  splint cast. She tolerated the procedure well and was taken to the recovery  room in satisfactory condition with no known complications.      JA/MEDQ  D:  03/01/2004  T:  03/02/2004  Job:  [id number]

## 2010-11-13 ENCOUNTER — Emergency Department (HOSPITAL_COMMUNITY)
Admission: EM | Admit: 2010-11-13 | Discharge: 2010-11-13 | Disposition: A | Discharge status: Home / Self Care | Payer: 59 | Attending: Emergency Medicine | Admitting: Emergency Medicine

## 2010-11-13 ENCOUNTER — Emergency Department (HOSPITAL_COMMUNITY): Payer: 59

## 2010-11-13 DIAGNOSIS — Z794 Long term (current) use of insulin: Secondary | ICD-10-CM | POA: Insufficient documentation

## 2010-11-13 DIAGNOSIS — S20229A Contusion of unspecified back wall of thorax, initial encounter: Secondary | ICD-10-CM | POA: Insufficient documentation

## 2010-11-13 DIAGNOSIS — W108XXA Fall (on) (from) other stairs and steps, initial encounter: Secondary | ICD-10-CM | POA: Insufficient documentation

## 2010-11-13 DIAGNOSIS — E119 Type 2 diabetes mellitus without complications: Secondary | ICD-10-CM | POA: Insufficient documentation

## 2010-11-13 DIAGNOSIS — M533 Sacrococcygeal disorders, not elsewhere classified: Secondary | ICD-10-CM | POA: Insufficient documentation

## 2010-11-13 LAB — OCCULT BLOOD, POC DEVICE: Fecal Occult Bld: NEGATIVE

## 2010-12-03 ENCOUNTER — Emergency Department (HOSPITAL_COMMUNITY)
Admission: EM | Admit: 2010-12-03 | Discharge: 2010-12-03 | Disposition: A | Discharge status: Home / Self Care | Payer: Self-pay | Attending: Emergency Medicine | Admitting: Emergency Medicine

## 2010-12-03 DIAGNOSIS — K5900 Constipation, unspecified: Secondary | ICD-10-CM | POA: Insufficient documentation

## 2010-12-03 DIAGNOSIS — E119 Type 2 diabetes mellitus without complications: Secondary | ICD-10-CM | POA: Insufficient documentation

## 2010-12-03 DIAGNOSIS — Z794 Long term (current) use of insulin: Secondary | ICD-10-CM | POA: Insufficient documentation

## 2010-12-03 LAB — OCCULT BLOOD, POC DEVICE: Fecal Occult Bld: NEGATIVE

## 2011-09-17 ENCOUNTER — Inpatient Hospital Stay (HOSPITAL_COMMUNITY)
Admission: EM | Admit: 2011-09-17 | Discharge: 2011-09-20 | DRG: 638 | Disposition: A | Discharge status: Home / Self Care | Payer: 59 | Attending: Internal Medicine | Admitting: Internal Medicine

## 2011-09-17 ENCOUNTER — Encounter (HOSPITAL_COMMUNITY): Payer: Self-pay | Admitting: Family Medicine

## 2011-09-17 DIAGNOSIS — E870 Hyperosmolality and hypernatremia: Principal | ICD-10-CM | POA: Diagnosis present

## 2011-09-17 DIAGNOSIS — Z79899 Other long term (current) drug therapy: Secondary | ICD-10-CM

## 2011-09-17 DIAGNOSIS — R109 Unspecified abdominal pain: Secondary | ICD-10-CM

## 2011-09-17 DIAGNOSIS — E1100 Type 2 diabetes mellitus with hyperosmolarity without nonketotic hyperglycemic-hyperosmolar coma (NKHHC): Secondary | ICD-10-CM | POA: Diagnosis present

## 2011-09-17 DIAGNOSIS — Z794 Long term (current) use of insulin: Secondary | ICD-10-CM

## 2011-09-17 DIAGNOSIS — E1069 Type 1 diabetes mellitus with other specified complication: Principal | ICD-10-CM | POA: Diagnosis present

## 2011-09-17 DIAGNOSIS — R000 Tachycardia, unspecified: Secondary | ICD-10-CM

## 2011-09-17 DIAGNOSIS — E1065 Type 1 diabetes mellitus with hyperglycemia: Principal | ICD-10-CM | POA: Diagnosis present

## 2011-09-17 DIAGNOSIS — K529 Noninfective gastroenteritis and colitis, unspecified: Secondary | ICD-10-CM | POA: Diagnosis present

## 2011-09-17 DIAGNOSIS — E860 Dehydration: Secondary | ICD-10-CM

## 2011-09-17 DIAGNOSIS — K5289 Other specified noninfective gastroenteritis and colitis: Secondary | ICD-10-CM | POA: Diagnosis present

## 2011-09-17 DIAGNOSIS — I498 Other specified cardiac arrhythmias: Secondary | ICD-10-CM | POA: Diagnosis present

## 2011-09-17 DIAGNOSIS — E871 Hypo-osmolality and hyponatremia: Secondary | ICD-10-CM | POA: Diagnosis present

## 2011-09-17 HISTORY — DX: Essential (primary) hypertension: I10

## 2011-09-17 NOTE — ED Notes (Signed)
Patient states she has had abdominal pain since Monday. States pain has gotten worse since. States nausea. Denies diarrhea. Indicates generalized abdominal pain.

## 2011-09-18 ENCOUNTER — Emergency Department (HOSPITAL_COMMUNITY): Payer: 59

## 2011-09-18 ENCOUNTER — Encounter (HOSPITAL_COMMUNITY): Payer: Self-pay

## 2011-09-18 DIAGNOSIS — R1084 Generalized abdominal pain: Secondary | ICD-10-CM

## 2011-09-18 DIAGNOSIS — G8929 Other chronic pain: Secondary | ICD-10-CM | POA: Insufficient documentation

## 2011-09-18 DIAGNOSIS — E1100 Type 2 diabetes mellitus with hyperosmolarity without nonketotic hyperglycemic-hyperosmolar coma (NKHHC): Secondary | ICD-10-CM | POA: Diagnosis present

## 2011-09-18 DIAGNOSIS — E873 Alkalosis: Secondary | ICD-10-CM | POA: Insufficient documentation

## 2011-09-18 DIAGNOSIS — R112 Nausea with vomiting, unspecified: Secondary | ICD-10-CM

## 2011-09-18 DIAGNOSIS — E1069 Type 1 diabetes mellitus with other specified complication: Secondary | ICD-10-CM

## 2011-09-18 DIAGNOSIS — R197 Diarrhea, unspecified: Secondary | ICD-10-CM

## 2011-09-18 DIAGNOSIS — R109 Unspecified abdominal pain: Secondary | ICD-10-CM | POA: Insufficient documentation

## 2011-09-18 DIAGNOSIS — E1065 Type 1 diabetes mellitus with hyperglycemia: Secondary | ICD-10-CM

## 2011-09-18 LAB — BLOOD GAS, VENOUS
Acid-Base Excess: 1.7 mmol/L (ref 0.0–2.0)
Bicarbonate: 25.8 mEq/L — ABNORMAL HIGH (ref 20.0–24.0)
FIO2: 0.21 %
O2 Saturation: 66.7 %
Patient temperature: 98.6
TCO2: 22.6 mmol/L (ref 0–100)
pCO2, Ven: 40.8 mmHg — ABNORMAL LOW (ref 45.0–50.0)
pH, Ven: 7.418 — ABNORMAL HIGH (ref 7.250–7.300)
pO2, Ven: 34.2 mmHg (ref 30.0–45.0)

## 2011-09-18 LAB — LIPASE, BLOOD: Lipase: 22 U/L (ref 11–59)

## 2011-09-18 LAB — URINALYSIS, ROUTINE W REFLEX MICROSCOPIC
Bilirubin Urine: NEGATIVE
Glucose, UA: >1000 mg/dL — AB
Hgb urine dipstick: NEGATIVE
Ketones, ur: 40 mg/dL — AB
Leukocytes, UA: NEGATIVE
Nitrite: NEGATIVE
Protein, ur: NEGATIVE mg/dL
Specific Gravity, Urine: 1.043 — ABNORMAL HIGH (ref 1.005–1.030)
Urobilinogen, UA: 0.2 mg/dL (ref 0.0–1.0)
pH: 6.5 (ref 5.0–8.0)

## 2011-09-18 LAB — URINE MICROSCOPIC-ADD ON

## 2011-09-18 LAB — COMPREHENSIVE METABOLIC PANEL
ALT: 28 U/L (ref 0–35)
AST: 23 U/L (ref 0–37)
Albumin: 3.6 g/dL (ref 3.5–5.2)
Alkaline Phosphatase: 95 U/L (ref 39–117)
BUN: 9 mg/dL (ref 6–23)
CO2: 24 mEq/L (ref 19–32)
Calcium: 9.5 mg/dL (ref 8.4–10.5)
Chloride: 91 mEq/L — ABNORMAL LOW (ref 96–112)
Creatinine, Ser: 0.3 mg/dL — ABNORMAL LOW (ref 0.50–1.10)
GFR calc Af Amer: >90 mL/min (ref >90)
GFR calc non Af Amer: >90 mL/min (ref >90)
Glucose, Bld: 359 mg/dL — ABNORMAL HIGH (ref 70–99)
Potassium: 3.8 mEq/L (ref 3.5–5.1)
Sodium: 128 mEq/L — ABNORMAL LOW (ref 135–145)
Total Bilirubin: 0.6 mg/dL (ref 0.3–1.2)
Total Protein: 7.8 g/dL (ref 6.0–8.3)

## 2011-09-18 LAB — GLUCOSE, CAPILLARY
Glucose-Capillary: 217 mg/dL — ABNORMAL HIGH (ref 70–99)
Glucose-Capillary: 245 mg/dL — ABNORMAL HIGH (ref 70–99)
Glucose-Capillary: 253 mg/dL — ABNORMAL HIGH (ref 70–99)
Glucose-Capillary: 262 mg/dL — ABNORMAL HIGH (ref 70–99)
Glucose-Capillary: 266 mg/dL — ABNORMAL HIGH (ref 70–99)
Glucose-Capillary: 338 mg/dL — ABNORMAL HIGH (ref 70–99)
Glucose-Capillary: 347 mg/dL — ABNORMAL HIGH (ref 70–99)

## 2011-09-18 LAB — DIFFERENTIAL
Basophils Absolute: 0 10*3/uL (ref 0.0–0.1)
Basophils Relative: 0 % (ref 0–1)
Eosinophils Absolute: 0 10*3/uL (ref 0.0–0.7)
Eosinophils Relative: 1 % (ref 0–5)
Lymphocytes Relative: 17 % (ref 12–46)
Lymphs Abs: 0.9 10*3/uL (ref 0.7–4.0)
Monocytes Absolute: 0.3 10*3/uL (ref 0.1–1.0)
Monocytes Relative: 6 % (ref 3–12)
Neutro Abs: 3.7 10*3/uL (ref 1.7–7.7)
Neutrophils Relative %: 75 % (ref 43–77)

## 2011-09-18 LAB — CBC
HCT: 41.9 % (ref 36.0–46.0)
Hemoglobin: 15.5 g/dL — ABNORMAL HIGH (ref 12.0–15.0)
MCH: 32.4 pg (ref 26.0–34.0)
MCHC: 37 g/dL — ABNORMAL HIGH (ref 30.0–36.0)
MCV: 87.7 fL (ref 78.0–100.0)
Platelets: 236 10*3/uL (ref 150–400)
RBC: 4.78 MIL/uL (ref 3.87–5.11)
RDW: 11 % — ABNORMAL LOW (ref 11.5–15.5)
WBC: 5 10*3/uL (ref 4.0–10.5)

## 2011-09-18 LAB — HEMOGLOBIN A1C
Hgb A1c MFr Bld: 12.9 % — ABNORMAL HIGH (ref <5.7)
Mean Plasma Glucose: 324 mg/dL — ABNORMAL HIGH (ref <117)

## 2011-09-18 LAB — D-DIMER, QUANTITATIVE: D-Dimer, Quant: 0.23 ug/mL-FEU (ref 0.00–0.48)

## 2011-09-18 LAB — TROPONIN I: Troponin I: <0.3 ng/mL (ref <0.30)

## 2011-09-18 LAB — POCT PREGNANCY, URINE: Preg Test, Ur: NEGATIVE

## 2011-09-18 IMAGING — CT CT ABD-PELV W/ CM
1 of 2 series · 15 of 32 positions shown, 19 images · IV contrast (100 ML OMNI 300)
Comparison: Sacrococcygeal radiographs performed [DATE]

CLINICAL DATA: Generalized abdominal pain and nausea.

CT ABDOMEN AND PELVIS WITH CONTRAST
TECHNIQUE: Multidetector CT imaging of the abdomen and pelvis was
performed following the standard protocol during bolus
administration of intravenous contrast.
Contrast: 100mL OMNIPAQUE IOHEXOL 300 MG/ML  SOLN

[Series 2: abd/pel with · axial · 0.62mm/px · z∈[-598,-178]mm · 15 of 92 slices shown, 19 images]
[im 4/92  soft-tissue]
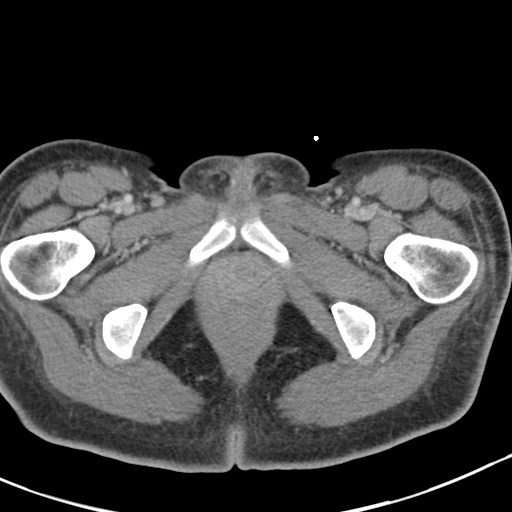
[im 4/92  bone]
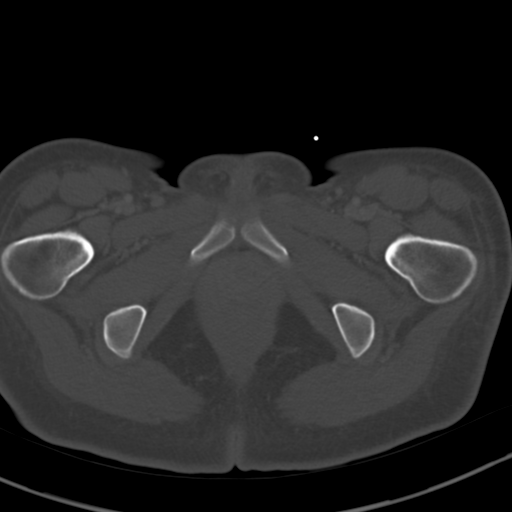
[im 11/92  soft-tissue]
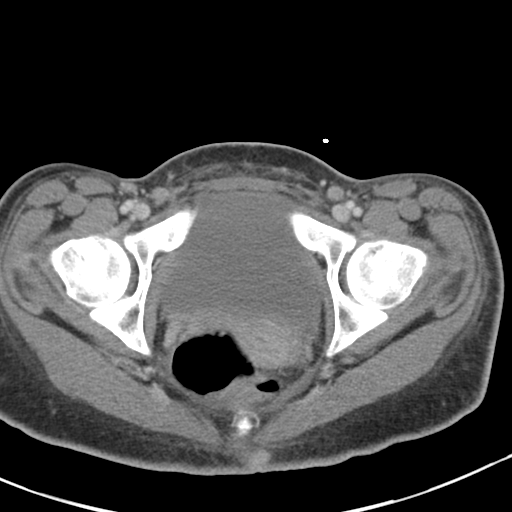
[im 18/92  soft-tissue]
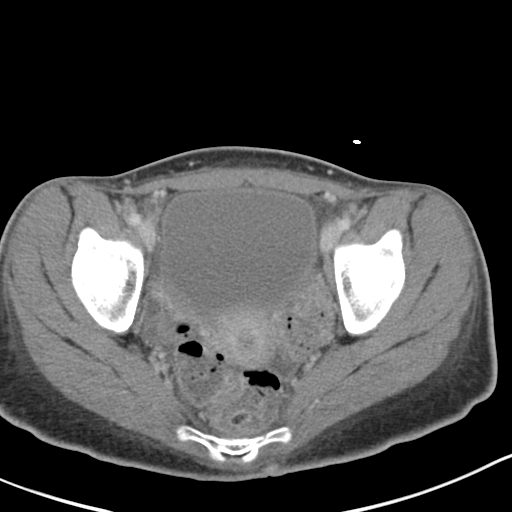
[im 25/92  soft-tissue]
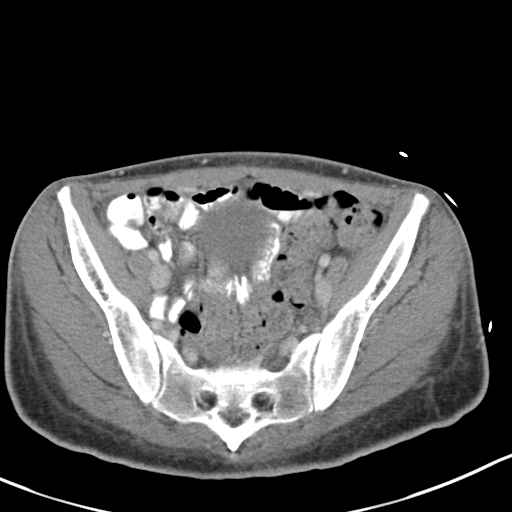
[im 32/92  soft-tissue]
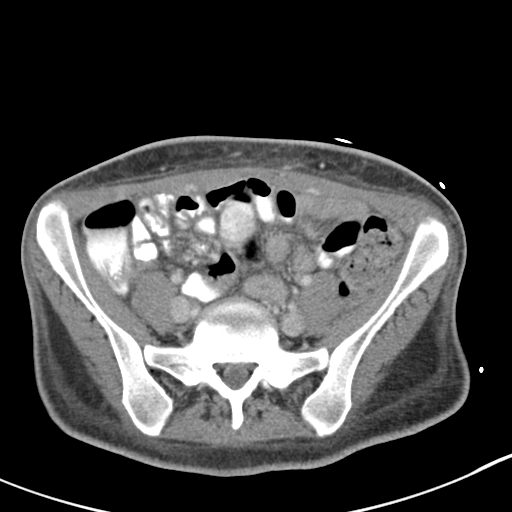
[im 39/92  soft-tissue]
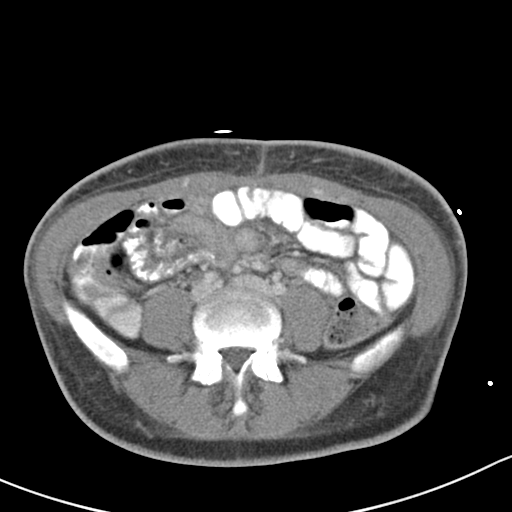
[im 46/92  soft-tissue]
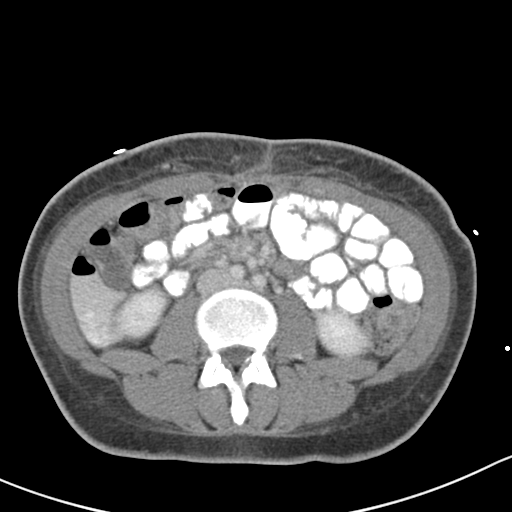
[im 53/92  soft-tissue]
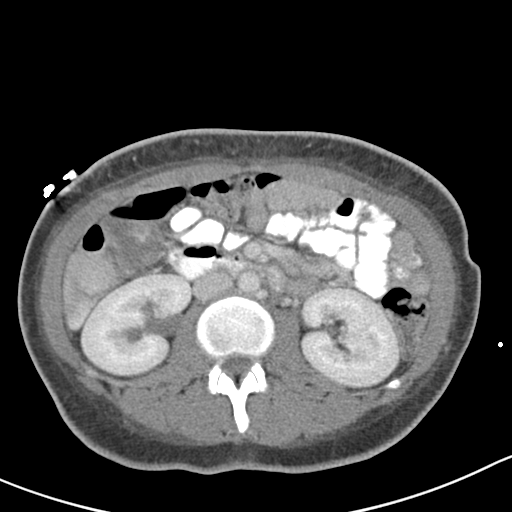
[im 60/92  soft-tissue]
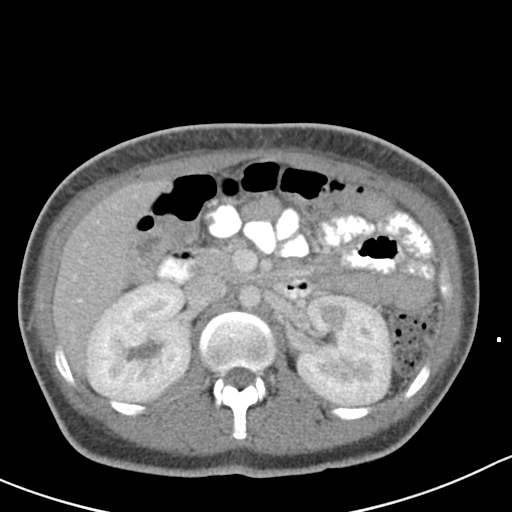
[im 60/92  bone]
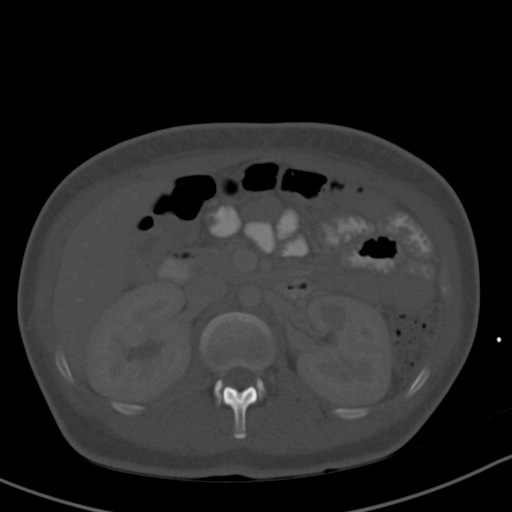
[im 67/92  soft-tissue]
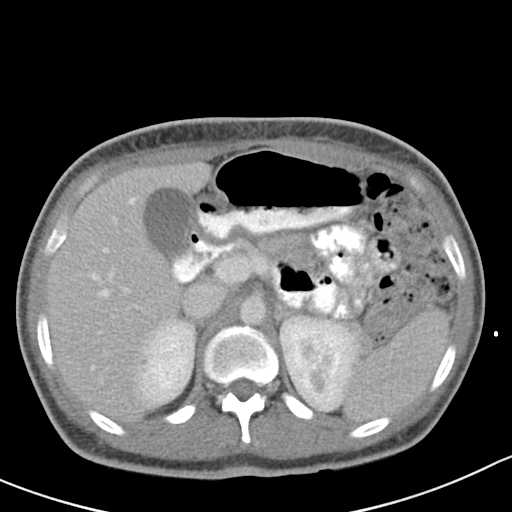
[im 74/92  soft-tissue]
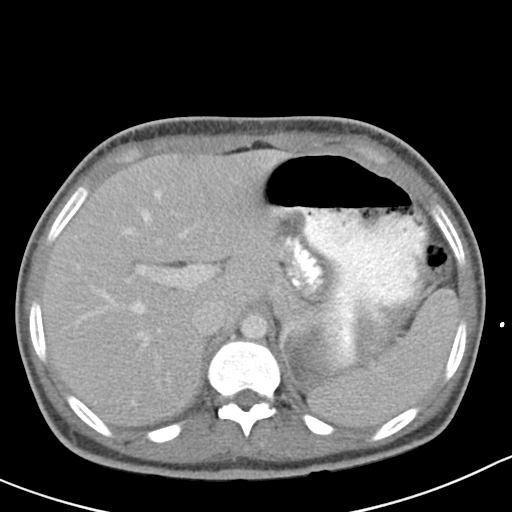
[im 78/92  lung]
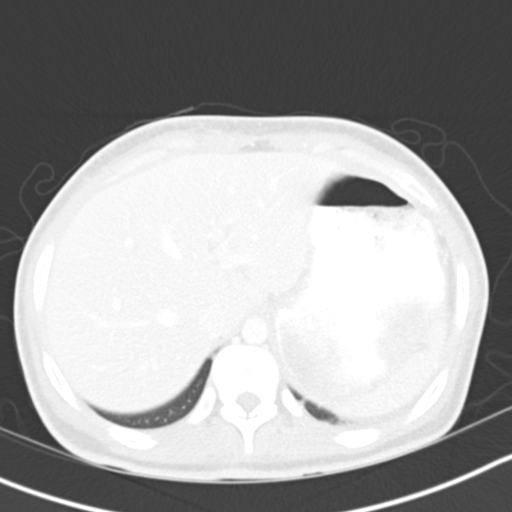
[im 81/92  soft-tissue]
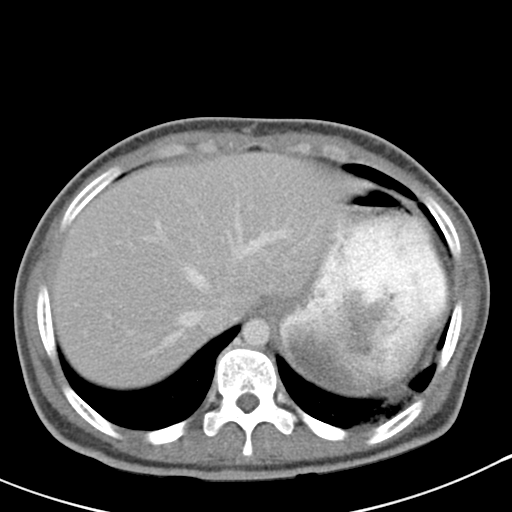
[im 81/92  lung]
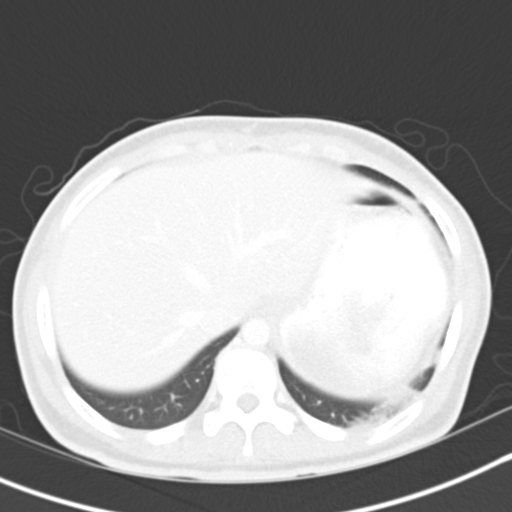
[im 85/92  lung]
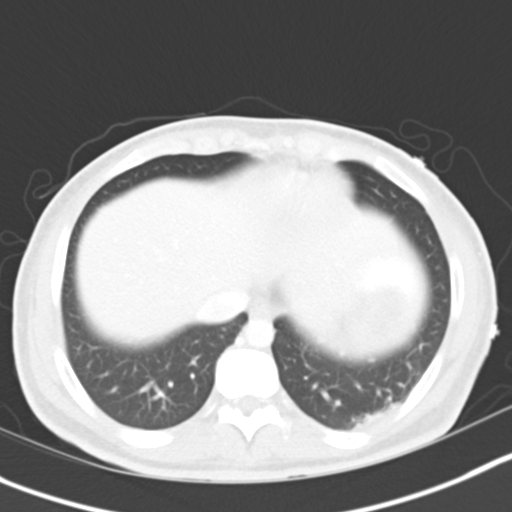
[im 88/92  soft-tissue]
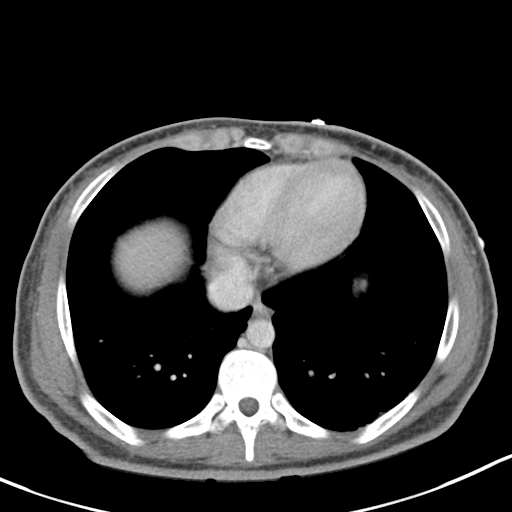
[im 88/92  lung]
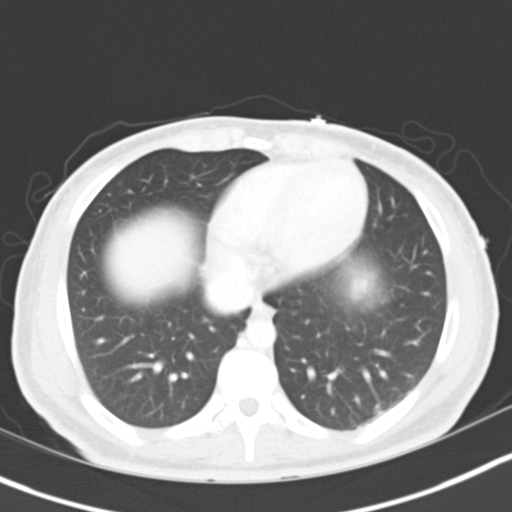

[15 of 32 positions shown; findings below may reference images not displayed]

FINDINGS: Mild left basilar opacity may reflect atelectasis.

The liver and spleen are unremarkable in appearance.  The
gallbladder is within normal limits.  The pancreas and adrenal
glands are unremarkable.

There is somewhat unusual diffuse renal medullary enhancement,
which may reflect the phase of contrast enhancement or possibly
some form of glomerulonephritis.  There is no evidence of
hydronephrosis.  No renal or ureteral stones are seen.  No
perinephric stranding is appreciated.

No free fluid is identified.  The small bowel is unremarkable in
appearance.  The stomach is within normal limits.  No acute
vascular abnormalities are seen.

The appendix is not definitely characterized; there is no evidence
for appendicitis.  Contrast progresses to the level of the hepatic
flexure of the colon.  The colon is difficult to fully assess due
to the lack of intraperitoneal fat, but appears grossly
unremarkable.

The bladder is moderately distended and grossly unremarkable
appearance.  The uterus is within normal limits.  The ovaries are
relatively symmetric; no suspicious adnexal masses are seen.  No
inguinal lymphadenopathy is seen.

Mild diffuse soft tissue edema and skin thickening are suggested.

No acute osseous abnormalities are identified.
IMPRESSION: 1.  No definite acute abnormalities seen within the abdomen or
pelvis.
2.  Somewhat unusual diffuse medullary enhancement noted within
both kidneys, which may reflect the phase of contrast enhancement
or possibly some form of glomerulonephritis.
3.  Mild diffuse soft tissue edema and skin thickening suggested
about the abdominal and pelvic wall; this could reflect very mild
anasarca.

4.  Mild left basilar airspace opacity could reflect atelectasis.

## 2011-09-18 IMAGING — CR DG CHEST 2V
2 series · 2 of 2 positions shown · non-contrast
Comparison: [DATE]

CLINICAL DATA: Chest and abdominal pain. Diabetes.

CHEST - 2 VIEW

[w chest pa]
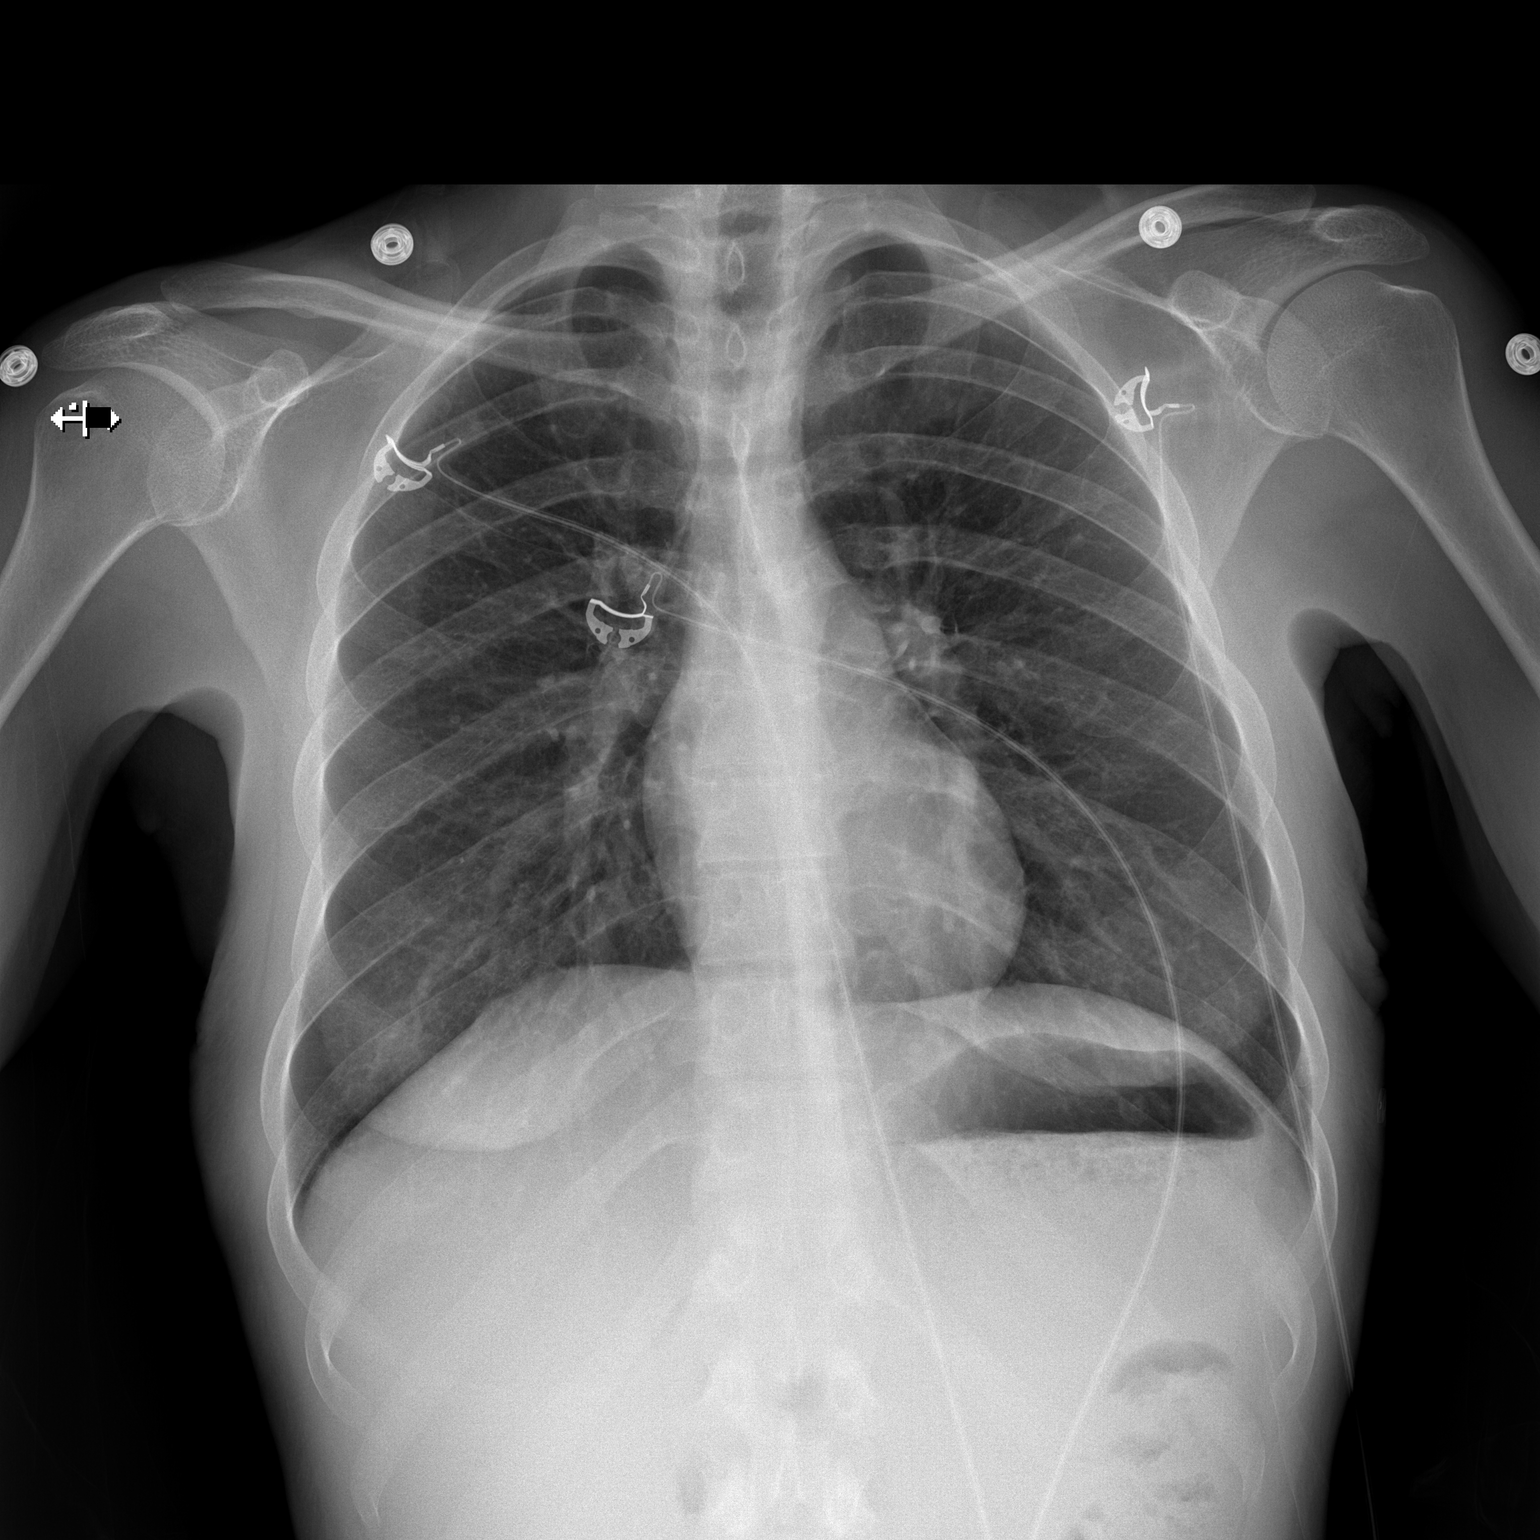

[w chest lat]
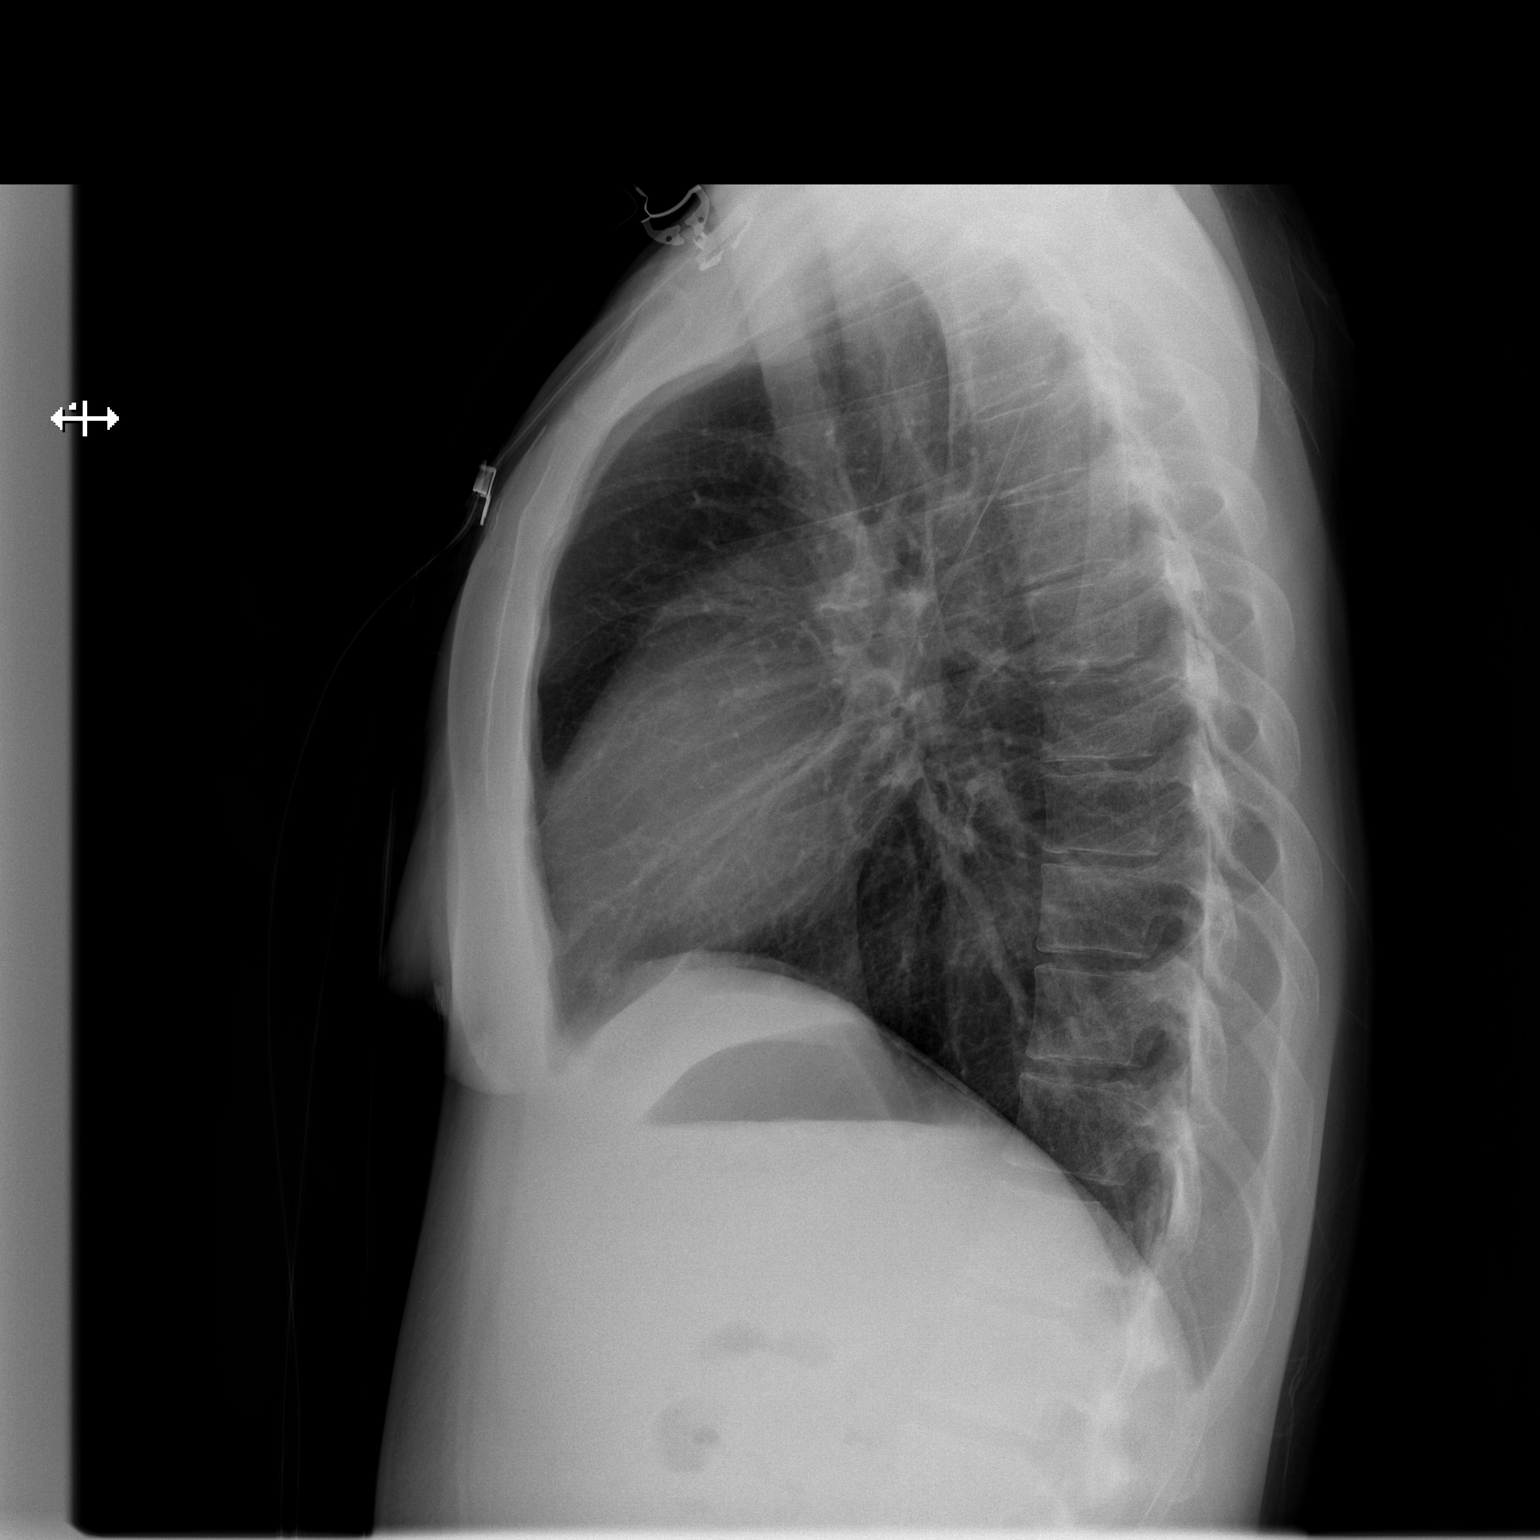

[2 of 2 positions shown; findings below may reference images not displayed]

FINDINGS: The heart size and mediastinal contours are within
normal limits.  Both lungs are clear.  The visualized skeletal
structures are unremarkable.
IMPRESSION: No active cardiopulmonary disease.

## 2011-09-18 MED ORDER — INSULIN ASPART 100 UNIT/ML ~~LOC~~ SOLN
0.0000 [IU] | Freq: Every day | SUBCUTANEOUS | Status: DC
  Administered 2011-09-18: 4 [IU] via SUBCUTANEOUS

## 2011-09-18 MED ORDER — INSULIN GLARGINE 100 UNIT/ML ~~LOC~~ SOLN
35.0000 [IU] | Freq: Every day | SUBCUTANEOUS | Status: DC
  Administered 2011-09-18 – 2011-09-19 (×2): 35 [IU] via SUBCUTANEOUS

## 2011-09-18 MED ORDER — SODIUM CHLORIDE 0.9 % IJ SOLN: 3.0000 mL | Freq: Two times a day (BID) | INTRAMUSCULAR | Status: DC

## 2011-09-18 MED ORDER — HYDROMORPHONE HCL PF 1 MG/ML IJ SOLN
1.0000 mg | INTRAMUSCULAR | Status: DC | PRN
  Administered 2011-09-18 – 2011-09-19 (×4): 1 mg via INTRAVENOUS
  Filled 2011-09-18 (×4): qty 1

## 2011-09-18 MED ORDER — ONDANSETRON HCL 4 MG/2ML IJ SOLN
4.0000 mg | Freq: Once | INTRAMUSCULAR | Status: AC
  Administered 2011-09-18: 4 mg via INTRAVENOUS
  Filled 2011-09-18: qty 2

## 2011-09-18 MED ORDER — SODIUM CHLORIDE 0.9 % IV SOLN
1000.0000 mL | INTRAVENOUS | Status: DC
  Administered 2011-09-18 – 2011-09-19 (×3): 1000 mL via INTRAVENOUS

## 2011-09-18 MED ORDER — ONDANSETRON HCL 4 MG/2ML IJ SOLN: 4.0000 mg | Freq: Three times a day (TID) | INTRAMUSCULAR | Status: DC | PRN

## 2011-09-18 MED ORDER — SODIUM CHLORIDE 0.9 % IV SOLN
1000.0000 mL | Freq: Once | INTRAVENOUS | Status: AC
  Administered 2011-09-18: 1000 mL via INTRAVENOUS

## 2011-09-18 MED ORDER — INSULIN ASPART 100 UNIT/ML ~~LOC~~ SOLN
0.0000 [IU] | Freq: Three times a day (TID) | SUBCUTANEOUS | Status: DC
  Administered 2011-09-18: 5 [IU] via SUBCUTANEOUS
  Administered 2011-09-19: 2 [IU] via SUBCUTANEOUS
  Administered 2011-09-19: 8 [IU] via SUBCUTANEOUS
  Administered 2011-09-19: 3 [IU] via SUBCUTANEOUS
  Administered 2011-09-20: 2 [IU] via SUBCUTANEOUS

## 2011-09-18 MED ORDER — IOHEXOL 300 MG/ML  SOLN
100.0000 mL | Freq: Once | INTRAMUSCULAR | Status: AC | PRN
  Administered 2011-09-18: 100 mL via INTRAVENOUS

## 2011-09-18 MED ORDER — HYDROMORPHONE HCL PF 1 MG/ML IJ SOLN
1.0000 mg | Freq: Once | INTRAMUSCULAR | Status: AC
  Administered 2011-09-18: 1 mg via INTRAVENOUS
  Filled 2011-09-18: qty 1

## 2011-09-18 MED ORDER — SODIUM CHLORIDE 0.9 % IV SOLN: INTRAVENOUS | Status: AC

## 2011-09-18 MED ORDER — ACETAMINOPHEN 325 MG PO TABS
650.0000 mg | ORAL_TABLET | Freq: Four times a day (QID) | ORAL | Status: DC | PRN
  Administered 2011-09-18: 650 mg via ORAL
  Filled 2011-09-18: qty 2

## 2011-09-18 MED ORDER — ACETAMINOPHEN 650 MG RE SUPP: 650.0000 mg | Freq: Four times a day (QID) | RECTAL | Status: DC | PRN

## 2011-09-18 MED ORDER — PANTOPRAZOLE SODIUM 40 MG IV SOLR
40.0000 mg | INTRAVENOUS | Status: DC
  Administered 2011-09-18: 40 mg via INTRAVENOUS
  Filled 2011-09-18 (×2): qty 40

## 2011-09-18 MED ORDER — ALUM & MAG HYDROXIDE-SIMETH 200-200-20 MG/5ML PO SUSP: 30.0000 mL | Freq: Four times a day (QID) | ORAL | Status: DC | PRN

## 2011-09-18 MED ORDER — INSULIN ASPART 100 UNIT/ML ~~LOC~~ SOLN
3.0000 [IU] | Freq: Three times a day (TID) | SUBCUTANEOUS | Status: DC
  Administered 2011-09-18 – 2011-09-19 (×3): 3 [IU] via SUBCUTANEOUS

## 2011-09-18 MED ORDER — INSULIN ASPART 100 UNIT/ML ~~LOC~~ SOLN
0.0000 [IU] | SUBCUTANEOUS | Status: DC
  Administered 2011-09-18: 5 [IU] via SUBCUTANEOUS

## 2011-09-18 MED ORDER — HYDROMORPHONE HCL PF 1 MG/ML IJ SOLN: 1.0000 mg | INTRAMUSCULAR | Status: DC | PRN

## 2011-09-18 MED ORDER — ONDANSETRON HCL 4 MG/2ML IJ SOLN: 4.0000 mg | Freq: Four times a day (QID) | INTRAMUSCULAR | Status: DC | PRN

## 2011-09-18 MED ORDER — ONDANSETRON HCL 4 MG PO TABS
4.0000 mg | ORAL_TABLET | Freq: Four times a day (QID) | ORAL | Status: DC | PRN
  Administered 2011-09-19 – 2011-09-20 (×2): 4 mg via ORAL
  Filled 2011-09-18 (×2): qty 1

## 2011-09-18 MED ORDER — SODIUM CHLORIDE 0.9 % IV SOLN
INTRAVENOUS | Status: DC
  Administered 2011-09-18: 19:00:00 via INTRAVENOUS
  Administered 2011-09-19: 50 mL/h via INTRAVENOUS

## 2011-09-18 NOTE — ED Notes (Signed)
Care of pt assumed. Pt c/o pain 6/10, upper abd. Med orders requested and received. Awaiting reassessment by EDP.

## 2011-09-18 NOTE — ED Provider Notes (Signed)
History     CSN: [id number]  Arrival date & time 09/17/11  2240   First MD Initiated Contact with Patient 09/18/11 0137   [facility], [name]   Chief Complaint  Patient presents with  . Abdominal Pain  . Chest Pain    Patient is a 21 y.o. female presenting with abdominal pain and chest pain.  Abdominal Pain The primary symptoms of the illness include abdominal pain. The primary symptoms of the illness do not include fever or shortness of breath. The current episode started more than 2 days ago. The onset of the illness was gradual.  The abdominal pain is located in the epigastric region. The abdominal pain radiates to the chest. The severity of the abdominal pain is 8/10. The abdominal pain is relieved by nothing. Exacerbated by: nothing particularly brings it on, she has not been able to discern a pattern.  Additional symptoms associated with the illness include anorexia, frequency and back pain. Symptoms associated with the illness do not include constipation, urgency or hematuria. Associated symptoms comments: vomiting.  Chest Pain Chest pain occurs constantly. The pain is associated with breathing. The severity of the pain is moderate. The quality of the pain is described as aching. The pain does not radiate. Primary symptoms include cough and abdominal pain. Pertinent negatives for primary symptoms include no fever and no shortness of breath.   Pt started having pain in her stomach on Monday.  Today the pain moved up into her chest as well. Past Medical History  Diagnosis Date  . Diabetes mellitus     Past Surgical History  Procedure Date  . Ankle surgery     No family history on file.  History  Substance Use Topics  . Smoking status: Never Smoker   . Smokeless tobacco: Not on file  . Alcohol Use: Yes     Occasional    OB History    Grav Para Term Preterm Abortions TAB SAB Ect Mult Living                  Review of Systems  Constitutional:  Negative for fever.  Respiratory: Positive for cough. Negative for shortness of breath.   Cardiovascular: Positive for chest pain.  Gastrointestinal: Positive for abdominal pain and anorexia. Negative for constipation.  Genitourinary: Positive for frequency. Negative for urgency and hematuria.  Musculoskeletal: Positive for back pain.  All other systems reviewed and are negative.    Allergies  Review of patient's allergies indicates no known allergies.  Home Medications   Current Outpatient Rx  Name Route Sig Dispense Refill  . ENALAPRIL MALEATE 10 MG PO TABS Oral Take 10 mg by mouth daily.    . INSULIN GLARGINE 100 UNIT/ML [place] SOLN Subcutaneous Inject 35-40 Units into the skin at bedtime. 40 units in the morning 35 units at bedtime    . INSULIN LISPRO (HUMAN) 100 UNIT/ML [place] SOLN Subcutaneous Inject 15-35 Units into the skin 3 (three) times daily before meals. Sliding scale    . METFORMIN HCL 500 MG PO TABS Oral Take 500 mg by mouth 2 (two) times daily with a meal.      BP 140/88  Pulse 138  Temp 98.6 F (37 C)  Resp 24  SpO2 100%  LMP 09/10/2011  Physical Exam  Nursing note and vitals reviewed. Constitutional: She appears well-developed and well-nourished. No distress.  HENT:  Head: Normocephalic and atraumatic.  Right Ear: External ear normal.  Left Ear: External ear normal.  Eyes:  Conjunctivae are normal. Right eye exhibits no discharge. Left eye exhibits no discharge. No scleral icterus.  Neck: Neck supple. No tracheal deviation present.  Cardiovascular: Regular rhythm and intact distal pulses.  Tachycardia present.   Pulmonary/Chest: Effort normal and breath sounds normal. No stridor. No respiratory distress. She has no wheezes. She has no rales.  Abdominal: Soft. Bowel sounds are normal. She exhibits no distension and no mass. There is tenderness in the right upper quadrant. There is no rigidity, no rebound and no guarding. No hernia.  Musculoskeletal: She exhibits no  edema and no tenderness.  Neurological: She is alert. She has normal strength. No sensory deficit. Cranial nerve deficit:  no gross defecits noted. She exhibits normal muscle tone. She displays no seizure activity. Coordination normal.  Skin: Skin is warm and dry. No rash noted.  Psychiatric: She has a normal mood and affect.    ED Course  Procedures (including critical care time)  Rate: 139  Rhythm: sinus tachycardia  QRS Axis: normal  Intervals: normal  ST/T Wave abnormalities: normal  Conduction Disutrbances:none  Old EKG Reviewed: none available  Labs Reviewed  GLUCOSE, CAPILLARY - Abnormal; Notable for the following:    Glucose-Capillary 347 (*)     All other components within normal limits  CBC - Abnormal; Notable for the following:    Hemoglobin 15.5 (*)     MCHC 37.0 (*)     RDW 11.0 (*)     All other components within normal limits  COMPREHENSIVE METABOLIC PANEL - Abnormal; Notable for the following:    Sodium 128 (*)     Chloride 91 (*)     Glucose, Bld 359 (*)     Creatinine, Ser 0.30 (*)     All other components within normal limits  URINALYSIS, ROUTINE W REFLEX MICROSCOPIC - Abnormal; Notable for the following:    Specific Gravity, Urine 1.043 (*)     Glucose, UA >1000 (*)     Ketones, ur 40 (*)     All other components within normal limits  BLOOD GAS, VENOUS - Abnormal; Notable for the following:    pH, Ven 7.418 (*)     pCO2, Ven 40.8 (*)     Bicarbonate 25.8 (*)     All other components within normal limits  GLUCOSE, CAPILLARY - Abnormal; Notable for the following:    Glucose-Capillary 266 (*)     All other components within normal limits  GLUCOSE, CAPILLARY - Abnormal; Notable for the following:    Glucose-Capillary 245 (*)     All other components within normal limits  DIFFERENTIAL  LIPASE, BLOOD  POCT PREGNANCY, URINE  URINE MICROSCOPIC-ADD ON   Dg Chest 2 View  09/18/2011  *RADIOLOGY REPORT*  Clinical Data: Chest and abdominal pain. Diabetes.   CHEST - 2 VIEW  Comparison:  09/01/2008  Findings:  The heart size and mediastinal contours are within normal limits.  Both lungs are clear.  The visualized skeletal structures are unremarkable.  IMPRESSION: No active cardiopulmonary disease.  Original Report Authenticated By: Marlaine Hind, M.D.   Ct Abdomen Pelvis W Contrast  09/18/2011  *RADIOLOGY REPORT*  Clinical Data: Generalized abdominal pain and nausea.  CT ABDOMEN AND PELVIS WITH CONTRAST  Technique:  Multidetector CT imaging of the abdomen and pelvis was performed following the standard protocol during bolus administration of intravenous contrast.  Contrast: 171m OMNIPAQUE IOHEXOL 300 MG/ML  SOLN  Comparison: Sacrococcygeal radiographs performed 11/13/2010  Findings: Mild left basilar opacity may reflect atelectasis.  The liver and spleen are unremarkable in appearance.  The gallbladder is within normal limits.  The pancreas and adrenal glands are unremarkable.  There is somewhat unusual diffuse renal medullary enhancement, which may reflect the phase of contrast enhancement or possibly some form of glomerulonephritis.  There is no evidence of hydronephrosis.  No renal or ureteral stones are seen.  No perinephric stranding is appreciated.  No free fluid is identified.  The small bowel is unremarkable in appearance.  The stomach is within normal limits.  No acute vascular abnormalities are seen.  The appendix is not definitely characterized; there is no evidence for appendicitis.  Contrast progresses to the level of the hepatic flexure of the colon.  The colon is difficult to fully assess due to the lack of intraperitoneal fat, but appears grossly unremarkable.  The bladder is moderately distended and grossly unremarkable appearance.  The uterus is within normal limits.  The ovaries are relatively symmetric; no suspicious adnexal masses are seen.  No inguinal lymphadenopathy is seen.  Mild diffuse soft tissue edema and skin thickening are suggested.   No acute osseous abnormalities are identified.  IMPRESSION:  1.  No definite acute abnormalities seen within the abdomen or pelvis. 2.  Somewhat unusual diffuse medullary enhancement noted within both kidneys, which may reflect the phase of contrast enhancement or possibly some form of glomerulonephritis. 3.  Mild diffuse soft tissue edema and skin thickening suggested about the abdominal and pelvic wall; this could reflect very mild anasarca.  4.  Mild left basilar airspace opacity could reflect atelectasis.  Original Report Authenticated By: Santa Lighter, M.D.      MDM  Pt is still having abdominal pain and tachycardia although decreased from initially.  No sign of acute abdominal abnormalities on CT.  No uti or pyelo.  Pt with hyperglycemia but no DKA.  Considering her persistent symptoms, will admit to the hospital for IV hydration, continued monitoring.  Without an acute process to account for her symptoms and the pain radiating up to her chest will add on d dimer to screen for PE and troponin to assess for myocarditis.      Kathalene Frames, MD 09/18/11 [phone]

## 2011-09-18 NOTE — H&P (Signed)
Seen and agree with above. [name] 09/18/2011 3:51 PM

## 2011-09-18 NOTE — H&P (Signed)
[facility] History and Physical  SAUMYA HUKILL XLK:[id number] DOB: 22-Oct-1990 DOA: 09/17/2011  Referring physician: Marye Round, MD Gabriel Cirri PCP: [name], MD           [facility]  Chief Complaint: abdominal pain, back pain  HPI:  21yo AAF with pmh of IDDM presents to ED today with complaints of abdominal pain, flank pain, vomiting and diarrhea. Pt states symptoms began on Monday 6/17. Symptoms started as epigastric pain and vomiting. She has not vomited since Monday, but pain continues in epigastrium and is now radiating to sternal area. She reports bilateral flank pain, nausea, anorexia x 3-4 days and onset of diarrhea this am. She denies any recent hematemesis, melena or hematochezia or fever. She denies any shortness of breath, but does have trouble getting deep breath sometimes due to pain. Pain is unchanged with po intake. She denies any vaginal bleeding or discharge. No recent antibiotics.     Of note, she and husband ate fish on Sunday evening. Her husband had similar symptoms on Monday, but his have since subsided. She admits she is noncompliant with her Novolog insulin, but does take her Lantus. ED workup consistent with hyperosmolar state. She present with sinus tachycardia, HR 130's and max temp of 99.5. CT abdomen and pelvis is unremarkable. Pt is to be admitted by [facility] for further evaluation and treatment.   Review of Systems:  As stated in hpi, otherwise negative  Past Medical History  Diagnosis Date  . Diabetes mellitus    Past Surgical History  Procedure Date  . Ankle surgery    Social History: Married. Works at [employer]. Nonsmoker, occasional etoh abuse.   No Known Allergies  Reviewed and [facility] to admit.   Prior to Admission medications   Medication Sig Start Date End Date Taking? Authorizing Provider  enalapril (VASOTEC) 10 MG tablet Take 10 mg by mouth daily.   Yes Historical Provider, MD  insulin glargine (LANTUS) 100 UNIT/ML  injection Inject 35-40 Units into the skin at bedtime. 40 units in the morning 35 units at bedtime   Yes Historical Provider, MD  insulin lispro (HUMALOG) 100 UNIT/ML injection Inject 15-35 Units into the skin 3 (three) times daily before meals. Sliding scale   Yes Historical Provider, MD  metFORMIN (GLUCOPHAGE) 500 MG tablet Take 500 mg by mouth 2 (two) times daily with a meal.   Yes Historical Provider, MD   Physical Exam: Filed Vitals:   09/18/11 0630 09/18/11 0700 09/18/11 0730 09/18/11 0800  BP: 129/77 135/86 136/89 122/76  Pulse: 126 123 121 118  Temp: 99.2 F (37.3 C)     TempSrc: Oral     Resp:  20 20 16   SpO2: 100% 100% 99% 98%     General:  Awake, alert lying in bed in NAD  Eyes: EOMI, no scleral icterus or injection  ENT: Mucous membranes dry, no oropharyngeal lesions are noted  Neck: supple without thyromegaly or lymphadenopathy, no JVD or carotid bruits  Cardiovascular: S1S2 RRR,no m/r/g, no LE edema  Respiratory: CTAB, no w/r/c, on increased wob  Abdomen: soft, ND, BS+. Tenderness on palpation of epigastrium without rebound or guarding. Mild CVA tenderness bilaterally  Skin: no suspicious rashes or lesions  Musculoskeletal: no joint deformity or swelling  Psychiatric: AOx4, normal mood and affect  Neurologic: MAEx4, no focal deficits on exam  Labs on Admission:  Basic Metabolic Panel:  Lab 36/64/40 0154  NA 128*  K 3.8  CL 91*  CO2 24  GLUCOSE 359*  BUN 9  CREATININE 0.30*  CALCIUM 9.5  MG --  PHOS --   Liver Function Tests:  Lab 09/18/11 0154  AST 23  ALT 28  ALKPHOS 95  BILITOT 0.6  PROT 7.8  ALBUMIN 3.6    Lab 09/18/11 0154  LIPASE 22  AMYLASE --   No results found for this basename: AMMONIA:5 in the last 168 hours CBC:  Lab 09/18/11 0154  WBC 5.0  NEUTROABS 3.7  HGB 15.5*  HCT 41.9  MCV 87.7  PLT 236   Cardiac Enzymes:  Lab 09/18/11 0819  CKTOTAL --  CKMB --  CKMBINDEX --  TROPONINI <0.30   BNP: No components  found with this basename: POCBNP:5 CBG:  Lab 09/18/11 0645 09/18/11 0310 09/18/11 0016  GLUCAP 245* 266* 347*    Radiological Exams on Admission: Dg Chest 2 View  09/18/2011  *RADIOLOGY REPORT*  Clinical Data: Chest and abdominal pain. Diabetes.  CHEST - 2 VIEW  Comparison:  09/01/2008  Findings:  The heart size and mediastinal contours are within normal limits.  Both lungs are clear.  The visualized skeletal structures are unremarkable.  IMPRESSION: No active cardiopulmonary disease.  Original Report Authenticated By: Marlaine Hind, M.D.   Ct Abdomen Pelvis W Contrast  09/18/2011  *RADIOLOGY REPORT*  Clinical Data: Generalized abdominal pain and nausea.  CT ABDOMEN AND PELVIS WITH CONTRAST  Technique:  Multidetector CT imaging of the abdomen and pelvis was performed following the standard protocol during bolus administration of intravenous contrast.  Contrast: 110m OMNIPAQUE IOHEXOL 300 MG/ML  SOLN  Comparison: Sacrococcygeal radiographs performed 11/13/2010  Findings: Mild left basilar opacity may reflect atelectasis.  The liver and spleen are unremarkable in appearance.  The gallbladder is within normal limits.  The pancreas and adrenal glands are unremarkable.  There is somewhat unusual diffuse renal medullary enhancement, which may reflect the phase of contrast enhancement or possibly some form of glomerulonephritis.  There is no evidence of hydronephrosis.  No renal or ureteral stones are seen.  No perinephric stranding is appreciated.  No free fluid is identified.  The small bowel is unremarkable in appearance.  The stomach is within normal limits.  No acute vascular abnormalities are seen.  The appendix is not definitely characterized; there is no evidence for appendicitis.  Contrast progresses to the level of the hepatic flexure of the colon.  The colon is difficult to fully assess due to the lack of intraperitoneal fat, but appears grossly unremarkable.  The bladder is moderately distended and  grossly unremarkable appearance.  The uterus is within normal limits.  The ovaries are relatively symmetric; no suspicious adnexal masses are seen.  No inguinal lymphadenopathy is seen.  Mild diffuse soft tissue edema and skin thickening are suggested.  No acute osseous abnormalities are identified.  IMPRESSION:  1.  No definite acute abnormalities seen within the abdomen or pelvis. 2.  Somewhat unusual diffuse medullary enhancement noted within both kidneys, which may reflect the phase of contrast enhancement or possibly some form of glomerulonephritis. 3.  Mild diffuse soft tissue edema and skin thickening suggested about the abdominal and pelvic wall; this could reflect very mild anasarca.  4.  Mild left basilar airspace opacity could reflect atelectasis.  Original Report Authenticated By: JSanta Lighter M.D.    EKG: Independently reviewed. Sinus tachycardia at 139bpm. No ischemic changes  Assessment/Plan Active Problems:  DIABETES MELLITUS, TYPE II  DIAB W/UNSPEC COMP TYPE I [JUV TYPE] UNCNTRL  Abdominal pain  Metabolic alkalosis  Diabetic hyperosmolar non-ketotic state   1. Epigastric pain, n/v/d: Unclear etiology. Could be viral in nature vs food-related as husband had very similar symptoms earlier this week. History is not really consistent with gastroparesis though pt is certainly susceptible to this given uncontrolled DM. CT abdomen and pelvis/CXR are unremarkable, she is afebrile, NT appearing and with normal WBC count. Negative troponin. EKG, Ddimer wnl. LFTs and lipase, u/a wnl. Exam mostly unremarkable. Will admit to telemetry (given tachycardia). Supportive care. Check stool studies. Add PPI. Clear liquid diet, advance as tolerated.   2. Hyperosmolar nonketotic syndrome: Secondary to above and with uncontrolled DM. Has received 2L IVFs in ED, will continue maintenance fluids. Control CBGs.  3. IDDM, uncontrolled prior to admit: Admits to noncompliance with Novolog insulin. Will  check A1C. q4 CBGs for now with SSI coverage. Continue lantus BID per home dose. Hold metformin for now to avoid renal insult.  4. Dehydration: Appears clinically dehydrated on exam and with ST. IVFs as mentioned above.  5. Sinus tachycardia: Secondary to above issues. No concern for PE given negative ddimer and oxygen saturation. IVFs. Monitor on telemetry.   6. Possible glomerulonephritis: Could not rule out on CT. Normal renal function, no hematuria. Can likely be followed up outpt. Will discuss with MD.   7. Prophylaxis: SCDs, early ambulation.   Code Status: full code  Family Communication: findings and plan of care discussed with pt and husband at bedside Disposition Plan: home when medically ready  Patrici Ranks, NP-C [place]  pgr [phone]   If 7PM-7AM, please contact night-coverage [email or web address] Password [facility] 09/18/2011, 9:21 AM

## 2011-09-18 NOTE — ED Notes (Signed)
CBG at this time= 347.

## 2011-09-19 DIAGNOSIS — R1084 Generalized abdominal pain: Secondary | ICD-10-CM

## 2011-09-19 DIAGNOSIS — R197 Diarrhea, unspecified: Secondary | ICD-10-CM

## 2011-09-19 DIAGNOSIS — E1065 Type 1 diabetes mellitus with hyperglycemia: Secondary | ICD-10-CM

## 2011-09-19 DIAGNOSIS — R112 Nausea with vomiting, unspecified: Secondary | ICD-10-CM

## 2011-09-19 DIAGNOSIS — K529 Noninfective gastroenteritis and colitis, unspecified: Secondary | ICD-10-CM | POA: Diagnosis present

## 2011-09-19 DIAGNOSIS — E871 Hypo-osmolality and hyponatremia: Secondary | ICD-10-CM | POA: Diagnosis present

## 2011-09-19 DIAGNOSIS — E1069 Type 1 diabetes mellitus with other specified complication: Secondary | ICD-10-CM

## 2011-09-19 LAB — COMPREHENSIVE METABOLIC PANEL
ALT: 15 U/L (ref 0–35)
AST: 13 U/L (ref 0–37)
Albumin: 2.5 g/dL — ABNORMAL LOW (ref 3.5–5.2)
Alkaline Phosphatase: 77 U/L (ref 39–117)
BUN: 9 mg/dL (ref 6–23)
CO2: 29 mEq/L (ref 19–32)
Calcium: 8.5 mg/dL (ref 8.4–10.5)
Chloride: 101 mEq/L (ref 96–112)
Creatinine, Ser: 0.36 mg/dL — ABNORMAL LOW (ref 0.50–1.10)
GFR calc Af Amer: >90 mL/min (ref >90)
GFR calc non Af Amer: >90 mL/min (ref >90)
Glucose, Bld: 266 mg/dL — ABNORMAL HIGH (ref 70–99)
Potassium: 3.5 mEq/L (ref 3.5–5.1)
Sodium: 135 mEq/L (ref 135–145)
Total Bilirubin: 0.5 mg/dL (ref 0.3–1.2)
Total Protein: 5.8 g/dL — ABNORMAL LOW (ref 6.0–8.3)

## 2011-09-19 LAB — CBC
HCT: 33.5 % — ABNORMAL LOW (ref 36.0–46.0)
Hemoglobin: 11.9 g/dL — ABNORMAL LOW (ref 12.0–15.0)
MCH: 31.3 pg (ref 26.0–34.0)
MCHC: 35.5 g/dL (ref 30.0–36.0)
MCV: 88.2 fL (ref 78.0–100.0)
Platelets: 189 10*3/uL (ref 150–400)
RBC: 3.8 MIL/uL — ABNORMAL LOW (ref 3.87–5.11)
RDW: 11.2 % — ABNORMAL LOW (ref 11.5–15.5)
WBC: 4.1 10*3/uL (ref 4.0–10.5)

## 2011-09-19 LAB — GLUCOSE, CAPILLARY
Glucose-Capillary: 253 mg/dL — ABNORMAL HIGH (ref 70–99)
Glucose-Capillary: 130 mg/dL — ABNORMAL HIGH (ref 70–99)
Glucose-Capillary: 155 mg/dL — ABNORMAL HIGH (ref 70–99)

## 2011-09-19 MED ORDER — INSULIN ASPART 100 UNIT/ML ~~LOC~~ SOLN
5.0000 [IU] | Freq: Three times a day (TID) | SUBCUTANEOUS | Status: DC
  Administered 2011-09-19 – 2011-09-20 (×3): 5 [IU] via SUBCUTANEOUS

## 2011-09-19 MED ORDER — HYDROMORPHONE HCL PF 1 MG/ML IJ SOLN
1.0000 mg | INTRAMUSCULAR | Status: DC | PRN
  Administered 2011-09-19 – 2011-09-20 (×4): 1 mg via INTRAVENOUS
  Filled 2011-09-19 (×4): qty 1

## 2011-09-19 MED ORDER — PANTOPRAZOLE SODIUM 40 MG PO TBEC
40.0000 mg | DELAYED_RELEASE_TABLET | Freq: Every day | ORAL | Status: DC
  Administered 2011-09-20: 40 mg via ORAL
  Filled 2011-09-19 (×2): qty 1

## 2011-09-19 MED ORDER — OXYCODONE-ACETAMINOPHEN 5-325 MG PO TABS
1.0000 | ORAL_TABLET | ORAL | Status: DC | PRN
  Administered 2011-09-20: 1 via ORAL
  Filled 2011-09-19: qty 2
  Filled 2011-09-19: qty 1

## 2011-09-19 NOTE — Progress Notes (Signed)
PROGRESS NOTE  [name] FIE:[id number] DOB: 1991-02-11 DOA: 09/17/2011 PCP: No primary provider on file.  Brief narrative: [name] is a 21 year old, probable type I diabetic, who was admitted on 09/18/11 with abdominal pain, nausea, vomiting, diarrhea, and uncontrolled hyperglycemia for several days.  Upon initial evaluation in the ER, she was found to be in a hyperosmolar non-ketotic state, and referred for admission.  Interim History: Stable overnight.   Assessment/Plan: Principal Problem:  *Gastroenteritis with nausea, vomiting, diarrhea and abdominal pain  CT scan of abdomen and pelvis unremarkable.  Abnormal enhancement of kidneys likely reflective of timing of contrast as the patient has no proteinuria suggestive of glomerulonephritis.    Suspect viral gastroenteritis versus food poisoning.  Stool studies requested, but had a formed stool last night and no further diarrhea in house.  Vomiting resolved with IVF and anti-emetics.  Some persistent abdominal pain.    Continue to hydrate and monitor for resolution. Active Problems:  DIAB W/UNSPEC COMP TYPE I [JUV TYPE] UNCNTRL  Although she has been treated for DM-II with metformin, her body habitus and presentation are consistent with type I DM.    Seen by diabetes coordinator 09/19/11.  Increase meal coverage to 5 units per recommendations.  Hemoglobin A1c 12.9%, indicative of very poor outpatient control.  CBGs 130-338 over past 24 hours.  Set up out-patient diabetes education.  Diabetic hyperosmolar non-ketotic state / hyponatremia  Severe hyperglycemia/hyponatremia improved with hydration, insulin.   Code Status: Full Family Communication: Boyfriend updated at bedside. Disposition Plan: Home when stable.  Medical Consultants:  None  Other consultants:  Diabetes coordinator.  Antibiotics:  None   Subjective  [name] continues to have some abdominal pain.  She has had some nausea but no vomiting.   Has been eating solid foods.  Had a formed stool last night with no further diarrhea.   Objective   Objective: Filed Vitals:   09/18/11 1010 09/18/11 1330 09/18/11 2056 09/19/11 0546  BP: 121/80 101/66 124/81 109/70  Pulse: 118 124 103 101  Temp: 98.6 F (37 C) 98.6 F (37 C) 98.6 F (37 C) 98.1 F (36.7 C)  TempSrc: Oral Oral Oral Oral  Resp: 18 18 18 18   Height: 5' 6"  (1.676 m)     Weight: 59.1 kg (130 lb 4.7 oz)     SpO2:  97% 100% 98%    Intake/Output Summary (Last 24 hours) at 09/19/11 1529 Last data filed at 09/19/11 0646  Gross per 24 hour  Intake 1470.83 ml  Output      0 ml  Net 1470.83 ml    Exam: Gen:  NAD Cardiovascular:  RRR, No M/R/G Respiratory: Lungs CTAB Gastrointestinal: Abdomen soft, NT/ND with normal active bowel sounds. Extremities: No C/E/C  Data Reviewed: Basic Metabolic Panel:  Lab 16/60/63 0435 09/18/11 0154  NA 135 128*  K 3.5 3.8  CL 101 91*  CO2 29 24  GLUCOSE 266* 359*  BUN 9 9  CREATININE 0.36* 0.30*  CALCIUM 8.5 9.5  MG -- --  PHOS -- --   GFR Estimated Creatinine Clearance: 102.9 ml/min (by C-G formula based on Cr of 0.36). Liver Function Tests:  Lab 09/19/11 0435 09/18/11 0154  AST 13 23  ALT 15 28  ALKPHOS 77 95  BILITOT 0.5 0.6  PROT 5.8* 7.8  ALBUMIN 2.5* 3.6    Lab 09/18/11 0154  LIPASE 22  AMYLASE --   CBC:  Lab 09/19/11 0435 09/18/11 0154  WBC 4.1 5.0  NEUTROABS --  3.7  HGB 11.9* 15.5*  HCT 33.5* 41.9  MCV 88.2 87.7  PLT 189 236   Cardiac Enzymes:  Lab 09/18/11 0819  CKTOTAL --  CKMB --  CKMBINDEX --  TROPONINI <0.30   CBG:  Lab 09/19/11 1144 09/19/11 0757 09/18/11 2154 09/18/11 1945 09/18/11 1540  GLUCAP 130* 253* 338* 253* 217*   D-Dimer  Basename 09/18/11 0819  DDIMER 0.23   Hgb A1c  Basename 09/18/11 0154  HGBA1C 12.9*   Microbiology No results found for this or any previous visit (from the past 240 hour(s)).  Procedures and Diagnostic Studies:  Dg Chest 2 View  09/18/2011  IMPRESSION: No active cardiopulmonary disease.  Original Report Authenticated By: Marlaine Hind, M.D.    Ct Abdomen Pelvis W Contrast 09/18/2011 IMPRESSION:  1.  No definite acute abnormalities seen within the abdomen or pelvis. 2.  Somewhat unusual diffuse medullary enhancement noted within both kidneys, which may reflect the phase of contrast enhancement or possibly some form of glomerulonephritis. 3.  Mild diffuse soft tissue edema and skin thickening suggested about the abdominal and pelvic wall; this could reflect very mild anasarca.  4.  Mild left basilar airspace opacity could reflect atelectasis.  Original Report Authenticated By: Santa Lighter, M.D.    Scheduled Meds:    . sodium chloride   Intravenous STAT  . insulin aspart  0-15 Units Subcutaneous TID WC  . insulin aspart  0-5 Units Subcutaneous QHS  . insulin aspart  3 Units Subcutaneous TID WC  . insulin glargine  35 Units Subcutaneous QHS  . pantoprazole (PROTONIX) IV  40 mg Intravenous Q24H  . sodium chloride  3 mL Intravenous Q12H  . DISCONTD: insulin aspart  0-9 Units Subcutaneous Q4H   Continuous Infusions:    . sodium chloride 1,000 mL (09/19/11 0959)  . sodium chloride 125 mL/hr at 09/18/11 1835      LOS: 2 days   Jacquelynn Cree, MD Pager [phone]  09/19/2011, 3:29 PM

## 2011-09-19 NOTE — Progress Notes (Signed)
Inpatient Diabetes Program Recommendations  AACE/ADA: New Consensus Statement on Inpatient Glycemic Control (2009)  Target Ranges:  Prepandial:   less than 140 mg/dL      Peak postprandial:   less than 180 mg/dL (1-2 hours)      Critically ill patients:  140 - 180 mg/dL   Reason for Visit: HONK  21 yo BF admitted with abd pain and dehydration with hyperglycemia.  She admits she is noncompliant with her Novolog insulin, but does take her Lantus. Sees [name] at [facility] for her diabetes, but states she is interested in seeing an endocrinologist for her diabetes.  Had DM since age 67.  Has been on metformin with and without insulin.  Has not been checking blood sugars and only takes Lantus and metformin.  Does not take Humalog and said she only eats 1 real meal a day since she works second shift.  Discussed importance of taking prescribed meds as directed, monitoring blood sugars and eating small meals throughout the day.  CO2 - 24 on admission. ? High doses of insulin as pt is very thin like a Type 1.    Results for LATRICE, STORLIE (MRN [id number]) as of 09/19/2011 13:49  Ref. Range 09/18/2011 03:10 09/18/2011 06:45 09/18/2011 12:01 09/18/2011 15:40 09/18/2011 19:45 09/18/2011 21:54 09/19/2011 07:57 09/19/2011 11:44  Glucose-Capillary Latest Range: 70-99 mg/dL 266 (H) 245 (H) 262 (H) 217 (H) 253 (H) 338 (H) 253 (H) 130 (H)   Results for RILLEY, POULTER (MRN [id number]) as of 09/19/2011 13:49  Ref. Range 09/18/2011 01:54  Hemoglobin A1C Latest Range: <5.7 % 12.9 (H)    Inpatient Diabetes Program Recommendations Insulin - Meal Coverage: Increase meal coverage insulin to Novolog 5 units tidwc Outpatient Referral: OP Diabetes Education consult for uncontrolled DM - pt states that she would be willing to go to OP center.  Note: Will benefit from OP Diabetes Education consult and encouraged pt to followup with PCP.  Also discussed importance of controlling blood sugars and reducing HgbA1c to  prevent complications.  Pt said her HgbA1C has been 12.  For awhile.  Discussed above with RN.

## 2011-09-20 DIAGNOSIS — E1065 Type 1 diabetes mellitus with hyperglycemia: Secondary | ICD-10-CM

## 2011-09-20 DIAGNOSIS — A088 Other specified intestinal infections: Secondary | ICD-10-CM

## 2011-09-20 DIAGNOSIS — E1069 Type 1 diabetes mellitus with other specified complication: Secondary | ICD-10-CM

## 2011-09-20 DIAGNOSIS — R197 Diarrhea, unspecified: Secondary | ICD-10-CM

## 2011-09-20 DIAGNOSIS — R112 Nausea with vomiting, unspecified: Secondary | ICD-10-CM

## 2011-09-20 LAB — GLUCOSE, CAPILLARY
Glucose-Capillary: 171 mg/dL — ABNORMAL HIGH (ref 70–99)
Glucose-Capillary: 148 mg/dL — ABNORMAL HIGH (ref 70–99)
Glucose-Capillary: 109 mg/dL — ABNORMAL HIGH (ref 70–99)

## 2011-09-20 MED ORDER — OXYCODONE-ACETAMINOPHEN 5-325 MG PO TABS: 1.0000 | ORAL_TABLET | ORAL | Status: AC | PRN

## 2011-09-20 MED ORDER — INSULIN ASPART 100 UNIT/ML ~~LOC~~ SOLN: 0.0000 [IU] | Freq: Three times a day (TID) | SUBCUTANEOUS | Status: DC

## 2011-09-20 MED ORDER — INSULIN ASPART 100 UNIT/ML ~~LOC~~ SOLN: 0.0000 [IU] | Freq: Every day | SUBCUTANEOUS | Status: DC

## 2011-09-20 MED ORDER — ONDANSETRON HCL 4 MG PO TABS: 4.0000 mg | ORAL_TABLET | Freq: Four times a day (QID) | ORAL | Status: AC | PRN

## 2011-09-20 MED ORDER — INSULIN GLARGINE 100 UNIT/ML ~~LOC~~ SOLN: 35.0000 [IU] | Freq: Every day | SUBCUTANEOUS | Status: DC

## 2011-09-20 NOTE — Discharge Summary (Signed)
Physician Discharge Summary  Patient ID: [name] MRN: [id number] DOB/AGE: 04/24/1990 21 y.o.  Admit date: 09/17/2011 Discharge date: 09/20/2011  Primary Care Physician:  MABE,DAVID, NP   Discharge Diagnoses:    .DIAB W/UNSPEC COMP TYPE I [JUV TYPE] UNCNTRL .Diabetic hyperosmolar non-ketotic state .Gastroenteritis with nausea, vomiting, diarrhea and abdominal pain .Hyponatremia  Discharge Medications:  Medication List  As of 09/20/2011 11:13 AM   STOP taking these medications         insulin lispro 100 UNIT/ML injection         TAKE these medications         enalapril 10 MG tablet   Commonly known as: VASOTEC   Take 10 mg by mouth daily.      insulin aspart 100 UNIT/ML injection   Commonly known as: novoLOG   Inject 0-15 Units into the skin 3 (three) times daily before meals. 5 u with each meal + sliding scale: CBG 70 - 120: 0 u: CBG 121 - 150: 2 u; CBG 151 - 200: 3 u; CBG 201 - 250: 5 u; CBG 251 - 300: 8 u;CBG 301 - 350: 11 u; CBG 351 - 400: 15 u; CBG > 400 Call MD      insulin aspart 100 UNIT/ML injection   Commonly known as: novoLOG   Inject 0-5 Units into the skin at bedtime.      insulin glargine 100 UNIT/ML injection   Commonly known as: LANTUS   Inject 35 Units into the skin at bedtime.      metFORMIN 500 MG tablet   Commonly known as: GLUCOPHAGE   Take 500 mg by mouth 2 (two) times daily with a meal.      ondansetron 4 MG tablet   Commonly known as: ZOFRAN   Take 1 tablet (4 mg total) by mouth every 6 (six) hours as needed for nausea.      oxyCODONE-acetaminophen 5-325 MG per tablet   Commonly known as: PERCOCET   Take 1-2 tablets by mouth every 4 (four) hours as needed.             Disposition and Follow-up: The patient is being discharged home.  She is instructed to follow up with her PCP in 1 week.   Medical Consults:  None  Other Consults:  Diabetes Coordinator   Procedures and Diagnostic Studies:   Dg Chest 2 View 09/18/2011   IMPRESSION: No active cardiopulmonary disease.  Original Report Authenticated By: Marlaine Hind, M.D.    Ct Abdomen Pelvis W Contrast 09/18/2011  IMPRESSION:  1.  No definite acute abnormalities seen within the abdomen or pelvis. 2.  Somewhat unusual diffuse medullary enhancement noted within both kidneys, which may reflect the phase of contrast enhancement or possibly some form of glomerulonephritis. 3.  Mild diffuse soft tissue edema and skin thickening suggested about the abdominal and pelvic wall; this could reflect very mild anasarca.  4.  Mild left basilar airspace opacity could reflect atelectasis.  Original Report Authenticated By: Santa Lighter, M.D.    Discharge Laboratory Values: Basic Metabolic Panel:  Lab 36/14/43 0435 09/18/11 0154  NA 135 128*  K 3.5 3.8  CL 101 91*  CO2 29 24  GLUCOSE 266* 359*  BUN 9 9  CREATININE 0.36* 0.30*  CALCIUM 8.5 9.5  MG -- --  PHOS -- --   GFR Estimated Creatinine Clearance: 102.9 ml/min (by C-G formula based on Cr of 0.36). Liver Function Tests:  Lab 09/19/11 0435 09/18/11 0154  AST  13 23  ALT 15 28  ALKPHOS 77 95  BILITOT 0.5 0.6  PROT 5.8* 7.8  ALBUMIN 2.5* 3.6    Lab 09/18/11 0154  LIPASE 22  AMYLASE --   CBC:  Lab 09/19/11 0435 09/18/11 0154  WBC 4.1 5.0  NEUTROABS -- 3.7  HGB 11.9* 15.5*  HCT 33.5* 41.9  MCV 88.2 87.7  PLT 189 236   Cardiac Enzymes:  Lab 09/18/11 0819  CKTOTAL --  CKMB --  CKMBINDEX --  TROPONINI <0.30   CBG:  Lab 09/19/11 2128 09/19/11 1718 09/19/11 1144 09/19/11 0757 09/18/11 2154  GLUCAP 171* 155* 130* 253* 338*   D-Dimer  Basename 09/18/11 0819  DDIMER 0.23   Hgb A1c  Basename 09/18/11 0154  HGBA1C 12.9*    Brief H and P: For complete details please refer to admission H and P, but in brief, Ms. Dodds is a 21 year old, probable type I diabetic, who was admitted on 09/18/11 with abdominal pain, nausea, vomiting, diarrhea, and uncontrolled hyperglycemia for several days. Upon  initial evaluation in the ER, she was found to be in a hyperosmolar non-ketotic state, and referred for admission.   Physical Exam at Discharge: BP 114/76  Pulse 100  Temp 98 F (36.7 C) (Oral)  Resp 18  Ht 5' 6"  (1.676 m)  Wt 59.1 kg (130 lb 4.7 oz)  BMI 21.03 kg/m2  SpO2 99%  LMP 09/10/2011 Gen:  NAD Cardiovascular:  RRR, No M/R/G Respiratory: Lungs CTAB Gastrointestinal: Abdomen soft, mildly tender with normal active bowel sounds. Extremities: No C/E/C    Hospital Course:  Principal Problem:  *Gastroenteritis with nausea, vomiting, diarrhea and abdominal pain   CT scan of abdomen and pelvis unremarkable. Abnormal enhancement of kidneys likely reflective of timing of contrast as the patient has no proteinuria suggestive of glomerulonephritis.   Suspect viral gastroenteritis versus food poisoning.   Stool studies requested, but had a formed stool last night and no further diarrhea in house.   Vomiting resolved with IVF and anti-emetics.   Some persistent abdominal pain, will d/c on oral Percocet therapy, to follow up with PCP next week.  Active Problems:  DIAB W/UNSPEC COMP TYPE I [JUV TYPE] UNCNTRL  Although she has been treated for DM-II with metformin, her body habitus and presentation are consistent with type I DM.  Seen by diabetes coordinator 09/19/11. Increase meal coverage to 5 units per recommendations.  Hemoglobin A1c 12.9%, indicative of very poor outpatient control.  CBGs improved at discharge, will d/c home on a combination on basal/bolus/meal coverage insulin.  Encouraged to take as prescribed (History of non-compliance). Out-patient diabetes education was set up. Diabetic hyperosmolar non-ketotic state / hyponatremia  Severe hyperglycemia/hyponatremia improved with hydration, insulin.   Recommendations for hospital follow-up: 1.  Close follow up by PCP for glycemic control.  Diet:  Carbohydrate modified.  Activity:  Increase activity  slowly.  Condition at Discharge:   Improved.  Time spent on Discharge:  25 minutes.  Signed: Dr. Margreta Journey Labria Wos Pager [phone] 09/20/2011, 11:13 AM

## 2011-09-20 NOTE — Discharge Instructions (Signed)
Viral Gastroenteritis Viral gastroenteritis is also known as stomach flu. This condition affects the stomach and intestinal tract. It can cause sudden diarrhea and vomiting. The illness typically lasts 3 to 8 days. Most people develop an immune response that eventually gets rid of the virus. While this natural response develops, the virus can make you quite ill. CAUSES  Many different viruses can cause gastroenteritis, such as rotavirus or noroviruses. You can catch one of these viruses by consuming contaminated food or water. You may also catch a virus by sharing utensils or other personal items with an infected person or by touching a contaminated surface. SYMPTOMS  The most common symptoms are diarrhea and vomiting. These problems can cause a severe loss of body fluids (dehydration) and a body salt (electrolyte) imbalance. Other symptoms may include:  Fever.   Headache.   Fatigue.   Abdominal pain.  DIAGNOSIS  Your caregiver can usually diagnose viral gastroenteritis based on your symptoms and a physical exam. A stool sample may also be taken to test for the presence of viruses or other infections. TREATMENT  This illness typically goes away on its own. Treatments are aimed at rehydration. The most serious cases of viral gastroenteritis involve vomiting so severely that you are not able to keep fluids down. In these cases, fluids must be given through an intravenous line (IV). HOME CARE INSTRUCTIONS   Drink enough fluids to keep your urine clear or pale yellow. Drink small amounts of fluids frequently and increase the amounts as tolerated.   Ask your caregiver for specific rehydration instructions.   Avoid:   Foods high in sugar.   Alcohol.   Carbonated drinks.   Tobacco.   Juice.   Caffeine drinks.   Extremely hot or cold fluids.   Fatty, greasy foods.   Too much intake of anything at one time.   Dairy products until 24 to 48 hours after diarrhea stops.   You may  consume probiotics. Probiotics are active cultures of beneficial bacteria. They may lessen the amount and number of diarrheal stools in adults. Probiotics can be found in yogurt with active cultures and in supplements.   Wash your hands well to avoid spreading the virus.   Only take over-the-counter or prescription medicines for pain, discomfort, or fever as directed by your caregiver. Do not give aspirin to children. Antidiarrheal medicines are not recommended.   Ask your caregiver if you should continue to take your regular prescribed and over-the-counter medicines.   Keep all follow-up appointments as directed by your caregiver.  SEEK IMMEDIATE MEDICAL CARE IF:   You are unable to keep fluids down.   You do not urinate at least once every 6 to 8 hours.   You develop shortness of breath.   You notice blood in your stool or vomit. This may look like coffee grounds.   You have abdominal pain that increases or is concentrated in one small area (localized).   You have persistent vomiting or diarrhea.   You have a fever.   The patient is a child younger than 3 months, and he or she has a fever.   The patient is a child older than 3 months, and he or she has a fever and persistent symptoms.   The patient is a child older than 3 months, and he or she has a fever and symptoms suddenly get worse.   The patient is a baby, and he or she has no tears when crying.  MAKE SURE YOU:  Understand these instructions.   Will watch your condition.   Will get help right away if you are not doing well or get worse.  Document Released: 03/17/2005 Document Revised: 03/06/2011 Document Reviewed: 01/01/2011 [facility] Patient Information 2012 [place].

## 2011-09-22 LAB — GLUCOSE, CAPILLARY
Glucose-Capillary: 111 mg/dL — ABNORMAL HIGH (ref 70–99)
Glucose-Capillary: 321 mg/dL — ABNORMAL HIGH (ref 70–99)

## 2011-10-15 ENCOUNTER — Encounter (HOSPITAL_COMMUNITY): Payer: Self-pay

## 2011-10-15 ENCOUNTER — Emergency Department (HOSPITAL_COMMUNITY)
Admission: EM | Admit: 2011-10-15 | Discharge: 2011-10-15 | Disposition: A | Discharge status: Home / Self Care | Payer: 59 | Attending: Emergency Medicine | Admitting: Emergency Medicine

## 2011-10-15 DIAGNOSIS — K2970 Gastritis, unspecified, without bleeding: Secondary | ICD-10-CM

## 2011-10-15 DIAGNOSIS — I10 Essential (primary) hypertension: Secondary | ICD-10-CM | POA: Insufficient documentation

## 2011-10-15 DIAGNOSIS — R1013 Epigastric pain: Secondary | ICD-10-CM | POA: Insufficient documentation

## 2011-10-15 DIAGNOSIS — E109 Type 1 diabetes mellitus without complications: Secondary | ICD-10-CM | POA: Insufficient documentation

## 2011-10-15 DIAGNOSIS — Z794 Long term (current) use of insulin: Secondary | ICD-10-CM | POA: Insufficient documentation

## 2011-10-15 LAB — DIFFERENTIAL
Basophils Absolute: 0 10*3/uL (ref 0.0–0.1)
Basophils Relative: 1 % (ref 0–1)
Eosinophils Absolute: 0.1 10*3/uL (ref 0.0–0.7)
Eosinophils Relative: 1 % (ref 0–5)
Lymphocytes Relative: 53 % — ABNORMAL HIGH (ref 12–46)
Lymphs Abs: 2.4 10*3/uL (ref 0.7–4.0)
Monocytes Absolute: 0.4 10*3/uL (ref 0.1–1.0)
Monocytes Relative: 8 % (ref 3–12)
Neutro Abs: 1.7 10*3/uL (ref 1.7–7.7)
Neutrophils Relative %: 38 % — ABNORMAL LOW (ref 43–77)

## 2011-10-15 LAB — URINALYSIS, ROUTINE W REFLEX MICROSCOPIC
Bilirubin Urine: NEGATIVE
Glucose, UA: NEGATIVE mg/dL
Hgb urine dipstick: NEGATIVE
Leukocytes, UA: NEGATIVE
Nitrite: NEGATIVE
Protein, ur: 30 mg/dL — AB
Specific Gravity, Urine: 1.037 — ABNORMAL HIGH (ref 1.005–1.030)
Urobilinogen, UA: 0.2 mg/dL (ref 0.0–1.0)
pH: 5.5 (ref 5.0–8.0)

## 2011-10-15 LAB — CBC
HCT: 37.5 % (ref 36.0–46.0)
Hemoglobin: 13.7 g/dL (ref 12.0–15.0)
MCH: 31.5 pg (ref 26.0–34.0)
MCHC: 36.5 g/dL — ABNORMAL HIGH (ref 30.0–36.0)
MCV: 86.2 fL (ref 78.0–100.0)
Platelets: 232 10*3/uL (ref 150–400)
RBC: 4.35 MIL/uL (ref 3.87–5.11)
RDW: 11.5 % (ref 11.5–15.5)
WBC: 4.4 10*3/uL (ref 4.0–10.5)

## 2011-10-15 LAB — COMPREHENSIVE METABOLIC PANEL
ALT: 19 U/L (ref 0–35)
AST: 16 U/L (ref 0–37)
Albumin: 4.2 g/dL (ref 3.5–5.2)
Alkaline Phosphatase: 70 U/L (ref 39–117)
BUN: 18 mg/dL (ref 6–23)
CO2: 26 mEq/L (ref 19–32)
Calcium: 9.7 mg/dL (ref 8.4–10.5)
Chloride: 98 mEq/L (ref 96–112)
Creatinine, Ser: 0.47 mg/dL — ABNORMAL LOW (ref 0.50–1.10)
GFR calc Af Amer: >90 mL/min (ref >90)
GFR calc non Af Amer: >90 mL/min (ref >90)
Glucose, Bld: 166 mg/dL — ABNORMAL HIGH (ref 70–99)
Potassium: 3.5 mEq/L (ref 3.5–5.1)
Sodium: 134 mEq/L — ABNORMAL LOW (ref 135–145)
Total Bilirubin: 0.5 mg/dL (ref 0.3–1.2)
Total Protein: 7.8 g/dL (ref 6.0–8.3)

## 2011-10-15 LAB — URINE MICROSCOPIC-ADD ON

## 2011-10-15 LAB — LIPASE, BLOOD: Lipase: 15 U/L (ref 11–59)

## 2011-10-15 LAB — POCT PREGNANCY, URINE: Preg Test, Ur: NEGATIVE

## 2011-10-15 LAB — GLUCOSE, CAPILLARY: Glucose-Capillary: 167 mg/dL — ABNORMAL HIGH (ref 70–99)

## 2011-10-15 MED ORDER — GI COCKTAIL ~~LOC~~
30.0000 mL | Freq: Once | ORAL | Status: AC
  Administered 2011-10-15: 30 mL via ORAL
  Filled 2011-10-15: qty 30

## 2011-10-15 MED ORDER — OMEPRAZOLE 20 MG PO CPDR: 20.0000 mg | DELAYED_RELEASE_CAPSULE | Freq: Every day | ORAL | Status: DC

## 2011-10-15 MED ORDER — SUCRALFATE 1 G PO TABS: 1.0000 g | ORAL_TABLET | Freq: Four times a day (QID) | ORAL | Status: DC

## 2011-10-15 MED ORDER — PANTOPRAZOLE SODIUM 40 MG PO TBEC
40.0000 mg | DELAYED_RELEASE_TABLET | Freq: Once | ORAL | Status: AC
  Administered 2011-10-15: 40 mg via ORAL
  Filled 2011-10-15: qty 1

## 2011-10-15 NOTE — ED Notes (Signed)
Pt presents with no acute distress. Generalized upper stomach pain x 1 day- Denies N/V/D and fever and urinary problems.

## 2011-10-15 NOTE — ED Provider Notes (Signed)
History     CSN: [id number]  Arrival date & time 10/15/11  0126   First MD Initiated Contact with Patient 10/15/11 [phone]      Chief Complaint  Patient presents with  . Abdominal Pain    (Consider location/radiation/quality/duration/timing/severity/associated sxs/prior treatment) HPI This is a 21 year old black female type 1 diabetes. He is complaining of epigastric pain that began yesterday morning. She states it feels like hunger pains only worse. It is of moderate severity. There is no specific exacerbating or mitigating factor. She has had nausea but no vomiting. She has had frequent belching. She denies diarrhea, vaginal bleeding or vaginal discharge. She's had similar episodes in the past but has never had a formal diagnosis.  Past Medical History  Diagnosis Date  . Diabetes mellitus   . Hypertension     Past Surgical History  Procedure Date  . Ankle surgery     No family history on file.  History  Substance Use Topics  . Smoking status: Never Smoker   . Smokeless tobacco: Never Used  . Alcohol Use: Yes     Occasional    OB History    Grav Para Term Preterm Abortions TAB SAB Ect Mult Living                  Review of Systems  All other systems reviewed and are negative.    Allergies  Review of patient's allergies indicates no known allergies.  Home Medications   Current Outpatient Rx  Name Route Sig Dispense Refill  . ENALAPRIL MALEATE 10 MG PO TABS Oral Take 10 mg by mouth daily.    . INSULIN ASPART 100 UNIT/ML [place] SOLN Subcutaneous Inject 0-15 Units into the skin 3 (three) times daily before meals. 5 u with each meal + sliding scale: CBG 70 - 120: 0 u: CBG 121 - 150: 2 u; CBG 151 - 200: 3 u; CBG 201 - 250: 5 u; CBG 251 - 300: 8 u;CBG 301 - 350: 11 u; CBG 351 - 400: 15 u; CBG > 400 Call MD    . INSULIN GLARGINE 100 UNIT/ML [place] SOLN Subcutaneous Inject 35 Units into the skin at bedtime.    Marland Kitchen METFORMIN HCL 500 MG PO TABS Oral Take 500 mg by mouth 2 (two)  times daily with a meal.    . ADULT MULTIVITAMIN W/MINERALS CH Oral Take 1 tablet by mouth daily.      BP 129/86  Pulse 105  Temp 98.6 F (37 C) (Oral)  Resp 18  Ht 5\' 7"  (1.702 m)  Wt 130 lb (58.968 kg)  BMI 20.36 kg/m2  SpO2 100%  LMP 09/10/2011  Physical Exam General: Well-developed, well-nourished female in no acute distress; appearance consistent with age of record HENT: normocephalic, atraumatic Eyes: pupils equal round and reactive to light; extraocular muscles intact Neck: supple Heart: regular rate and rhythm Lungs: clear to auscultation bilaterally Abdomen: soft; nondistended; mild diffuse tenderness with moderate tenderness in the epigastrium; no masses or hepatosplenomegaly; bowel sounds present Extremities: No deformity; full range of motion; pulses normal Neurologic: Awake, alert and oriented; motor function intact in all extremities and symmetric; no facial droop Skin: Warm and dry     ED Course  Procedures (including critical care time)     MDM   Nursing notes and vitals signs, including pulse oximetry, reviewed.  Summary of this visit's results, reviewed by myself:  Labs:  Results for orders placed during the hospital encounter of 10/15/11  CBC  Component Value Range   WBC 4.4  4.0 - 10.5 K/uL   RBC 4.35  3.87 - 5.11 MIL/uL   Hemoglobin 13.7  12.0 - 15.0 g/dL   HCT 16.1  09.6 - 04.5 %   MCV 86.2  78.0 - 100.0 fL   MCH 31.5  26.0 - 34.0 pg   MCHC 36.5 (*) 30.0 - 36.0 g/dL   RDW 40.9  81.1 - 91.4 %   Platelets 232  150 - 400 K/uL  DIFFERENTIAL      Component Value Range   Neutrophils Relative 38 (*) 43 - 77 %   Neutro Abs 1.7  1.7 - 7.7 K/uL   Lymphocytes Relative 53 (*) 12 - 46 %   Lymphs Abs 2.4  0.7 - 4.0 K/uL   Monocytes Relative 8  3 - 12 %   Monocytes Absolute 0.4  0.1 - 1.0 K/uL   Eosinophils Relative 1  0 - 5 %   Eosinophils Absolute 0.1  0.0 - 0.7 K/uL   Basophils Relative 1  0 - 1 %   Basophils Absolute 0.0  0.0 - 0.1 K/uL   COMPREHENSIVE METABOLIC PANEL      Component Value Range   Sodium 134 (*) 135 - 145 mEq/L   Potassium 3.5  3.5 - 5.1 mEq/L   Chloride 98  96 - 112 mEq/L   CO2 26  19 - 32 mEq/L   Glucose, Bld 166 (*) 70 - 99 mg/dL   BUN 18  6 - 23 mg/dL   Creatinine, Ser 7.82 (*) 0.50 - 1.10 mg/dL   Calcium 9.7  8.4 - 95.6 mg/dL   Total Protein 7.8  6.0 - 8.3 g/dL   Albumin 4.2  3.5 - 5.2 g/dL   AST 16  0 - 37 U/L   ALT 19  0 - 35 U/L   Alkaline Phosphatase 70  39 - 117 U/L   Total Bilirubin 0.5  0.3 - 1.2 mg/dL   GFR calc non Af Amer >90  >90 mL/min   GFR calc Af Amer >90  >90 mL/min  URINALYSIS, ROUTINE W REFLEX MICROSCOPIC      Component Value Range   Color, Urine YELLOW  YELLOW   APPearance CLOUDY (*) CLEAR   Specific Gravity, Urine 1.037 (*) 1.005 - 1.030   pH 5.5  5.0 - 8.0   Glucose, UA NEGATIVE  NEGATIVE mg/dL   Hgb urine dipstick NEGATIVE  NEGATIVE   Bilirubin Urine NEGATIVE  NEGATIVE   Ketones, ur TRACE (*) NEGATIVE mg/dL   Protein, ur 30 (*) NEGATIVE mg/dL   Urobilinogen, UA 0.2  0.0 - 1.0 mg/dL   Nitrite NEGATIVE  NEGATIVE   Leukocytes, UA NEGATIVE  NEGATIVE  LIPASE, BLOOD      Component Value Range   Lipase 15  11 - 59 U/L  GLUCOSE, CAPILLARY      Component Value Range   Glucose-Capillary 167 (*) 70 - 99 mg/dL  POCT PREGNANCY, URINE      Component Value Range   Preg Test, Ur NEGATIVE  NEGATIVE  URINE MICROSCOPIC-ADD ON      Component Value Range   Squamous Epithelial / LPF FEW (*) RARE   WBC, UA 0-2  <3 WBC/hpf   Bacteria, UA FEW (*) RARE   Crystals CA OXALATE CRYSTALS (*) NEGATIVE   Urine-Other MUCOUS PRESENT     4:59 AM Improvement with GI cocktail. History is consistent with gastritis and we will treat accordingly.  Hanley Seamen, MD 10/15/11 [phone]

## 2011-10-16 ENCOUNTER — Encounter (HOSPITAL_COMMUNITY): Payer: Self-pay | Admitting: *Deleted

## 2011-10-16 ENCOUNTER — Emergency Department (HOSPITAL_COMMUNITY)
Admission: EM | Admit: 2011-10-16 | Discharge: 2011-10-17 | Disposition: A | Discharge status: Home / Self Care | Payer: 59 | Attending: Emergency Medicine | Admitting: Emergency Medicine

## 2011-10-16 DIAGNOSIS — E119 Type 2 diabetes mellitus without complications: Secondary | ICD-10-CM | POA: Insufficient documentation

## 2011-10-16 DIAGNOSIS — Z794 Long term (current) use of insulin: Secondary | ICD-10-CM | POA: Insufficient documentation

## 2011-10-16 DIAGNOSIS — R109 Unspecified abdominal pain: Secondary | ICD-10-CM

## 2011-10-16 DIAGNOSIS — I10 Essential (primary) hypertension: Secondary | ICD-10-CM | POA: Insufficient documentation

## 2011-10-16 DIAGNOSIS — R110 Nausea: Secondary | ICD-10-CM | POA: Insufficient documentation

## 2011-10-16 DIAGNOSIS — R1012 Left upper quadrant pain: Secondary | ICD-10-CM | POA: Insufficient documentation

## 2011-10-16 DIAGNOSIS — R1013 Epigastric pain: Secondary | ICD-10-CM | POA: Insufficient documentation

## 2011-10-16 NOTE — ED Notes (Signed)
Pt c/o abd pain x 3 days; treated earlier in wk for same; worse tonight

## 2011-10-17 ENCOUNTER — Emergency Department (HOSPITAL_COMMUNITY): Payer: 59

## 2011-10-17 LAB — CBC WITH DIFFERENTIAL/PLATELET
Basophils Absolute: 0 10*3/uL (ref 0.0–0.1)
Basophils Relative: 1 % (ref 0–1)
Eosinophils Absolute: 0 10*3/uL (ref 0.0–0.7)
Eosinophils Relative: 1 % (ref 0–5)
HCT: 36 % (ref 36.0–46.0)
Hemoglobin: 13.3 g/dL (ref 12.0–15.0)
Lymphocytes Relative: 60 % — ABNORMAL HIGH (ref 12–46)
Lymphs Abs: 2.1 10*3/uL (ref 0.7–4.0)
MCH: 31.5 pg (ref 26.0–34.0)
MCHC: 36.9 g/dL — ABNORMAL HIGH (ref 30.0–36.0)
MCV: 85.3 fL (ref 78.0–100.0)
Monocytes Absolute: 0.3 10*3/uL (ref 0.1–1.0)
Monocytes Relative: 9 % (ref 3–12)
Neutro Abs: 1 10*3/uL — ABNORMAL LOW (ref 1.7–7.7)
Neutrophils Relative %: 29 % — ABNORMAL LOW (ref 43–77)
Platelets: 207 10*3/uL (ref 150–400)
RBC: 4.22 MIL/uL (ref 3.87–5.11)
RDW: 11.4 % — ABNORMAL LOW (ref 11.5–15.5)
WBC: 3.5 10*3/uL — ABNORMAL LOW (ref 4.0–10.5)

## 2011-10-17 LAB — COMPREHENSIVE METABOLIC PANEL
ALT: 15 U/L (ref 0–35)
AST: 14 U/L (ref 0–37)
Albumin: 3.9 g/dL (ref 3.5–5.2)
Alkaline Phosphatase: 68 U/L (ref 39–117)
BUN: 9 mg/dL (ref 6–23)
CO2: 23 mEq/L (ref 19–32)
Calcium: 9.3 mg/dL (ref 8.4–10.5)
Chloride: 98 mEq/L (ref 96–112)
Creatinine, Ser: 0.44 mg/dL — ABNORMAL LOW (ref 0.50–1.10)
GFR calc Af Amer: >90 mL/min (ref >90)
GFR calc non Af Amer: >90 mL/min (ref >90)
Glucose, Bld: 256 mg/dL — ABNORMAL HIGH (ref 70–99)
Potassium: 3.7 mEq/L (ref 3.5–5.1)
Sodium: 134 mEq/L — ABNORMAL LOW (ref 135–145)
Total Bilirubin: 0.5 mg/dL (ref 0.3–1.2)
Total Protein: 7.5 g/dL (ref 6.0–8.3)

## 2011-10-17 LAB — URINALYSIS, ROUTINE W REFLEX MICROSCOPIC
Bilirubin Urine: NEGATIVE
Glucose, UA: >1000 mg/dL — AB
Hgb urine dipstick: NEGATIVE
Ketones, ur: 40 mg/dL — AB
Leukocytes, UA: NEGATIVE
Nitrite: NEGATIVE
Protein, ur: NEGATIVE mg/dL
Specific Gravity, Urine: 1.027 (ref 1.005–1.030)
Urobilinogen, UA: 1 mg/dL (ref 0.0–1.0)
pH: 6.5 (ref 5.0–8.0)

## 2011-10-17 LAB — URINE MICROSCOPIC-ADD ON

## 2011-10-17 LAB — LIPASE, BLOOD: Lipase: 19 U/L (ref 11–59)

## 2011-10-17 IMAGING — CR DG ABDOMEN ACUTE W/ 1V CHEST
3 series · 3 of 3 positions shown · non-contrast
Comparison: Chest [DATE].  CT abdomen and pelvis [DATE].

CLINICAL DATA: Left upper abdominal pain.  Fever and nausea.

ACUTE ABDOMEN SERIES (ABDOMEN 2 VIEW & CHEST 1 VIEW)

[w chest pa]
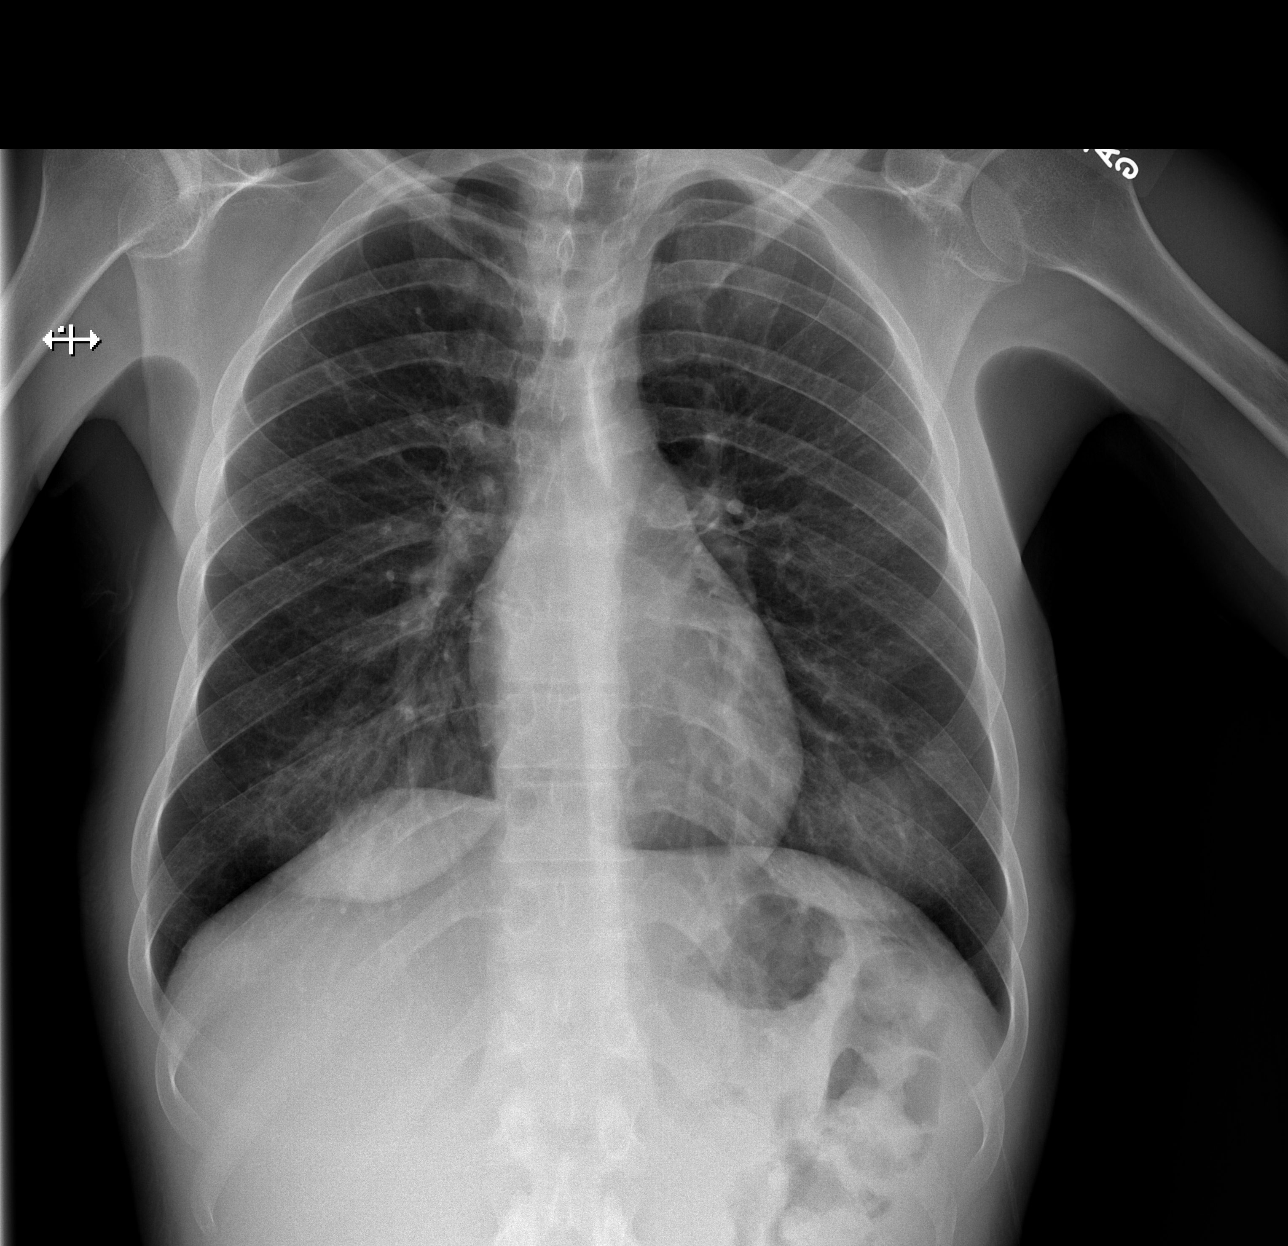

[w abdomen upright]
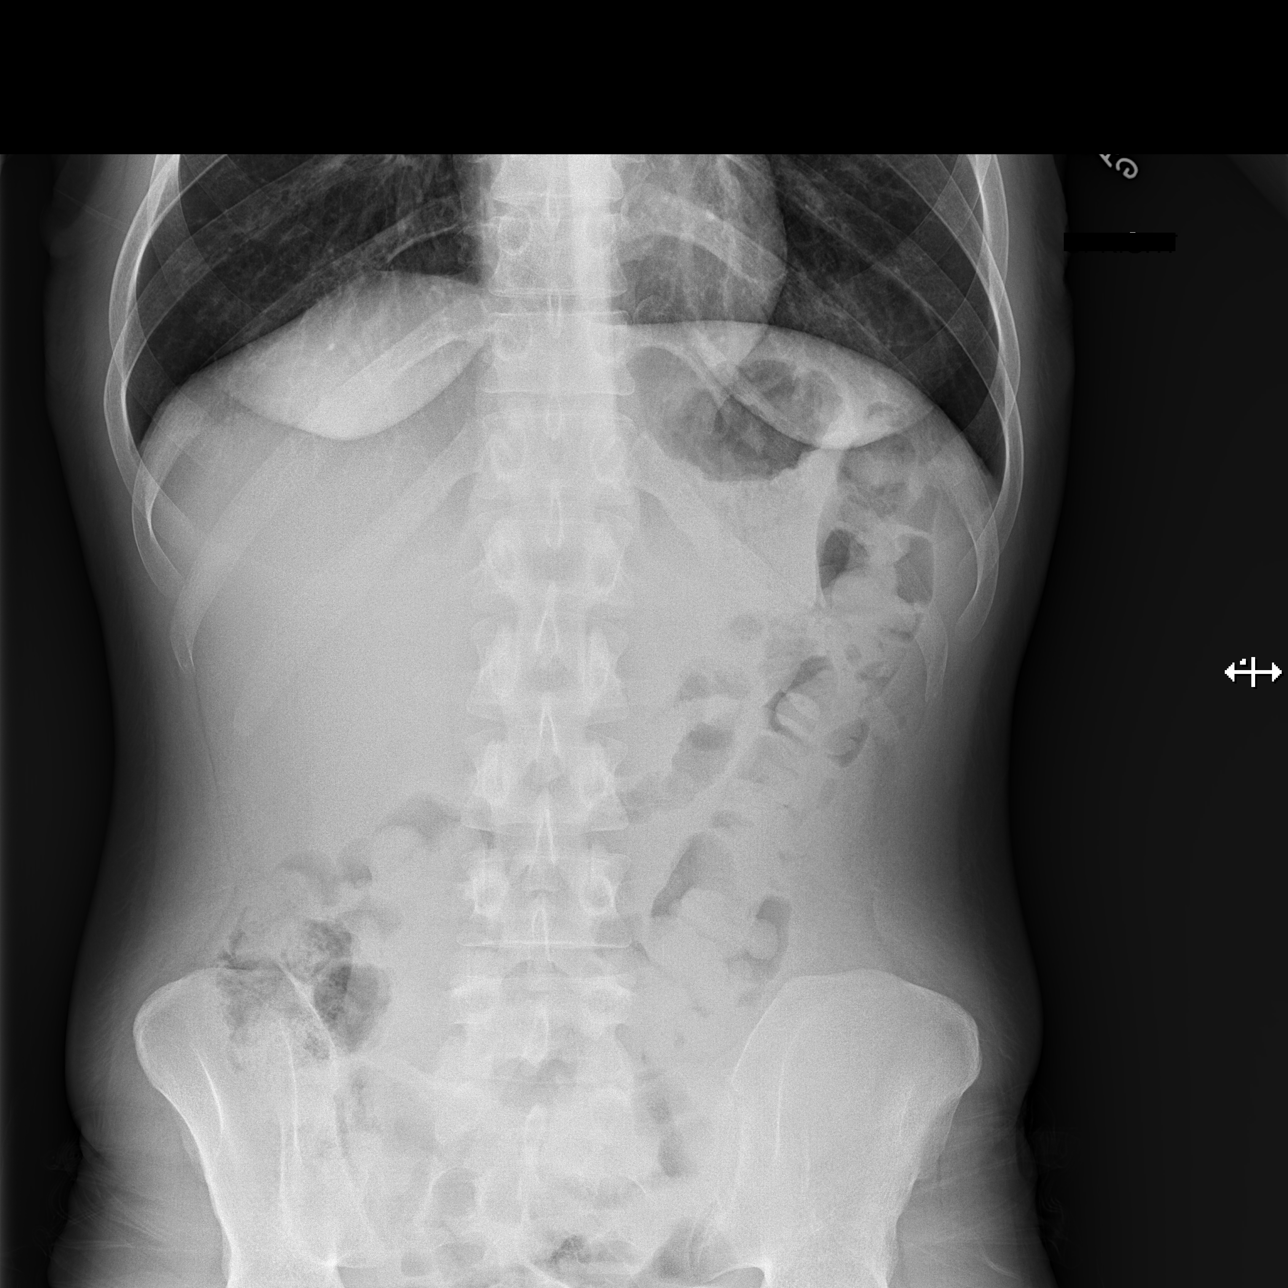

[t abdomen supine]
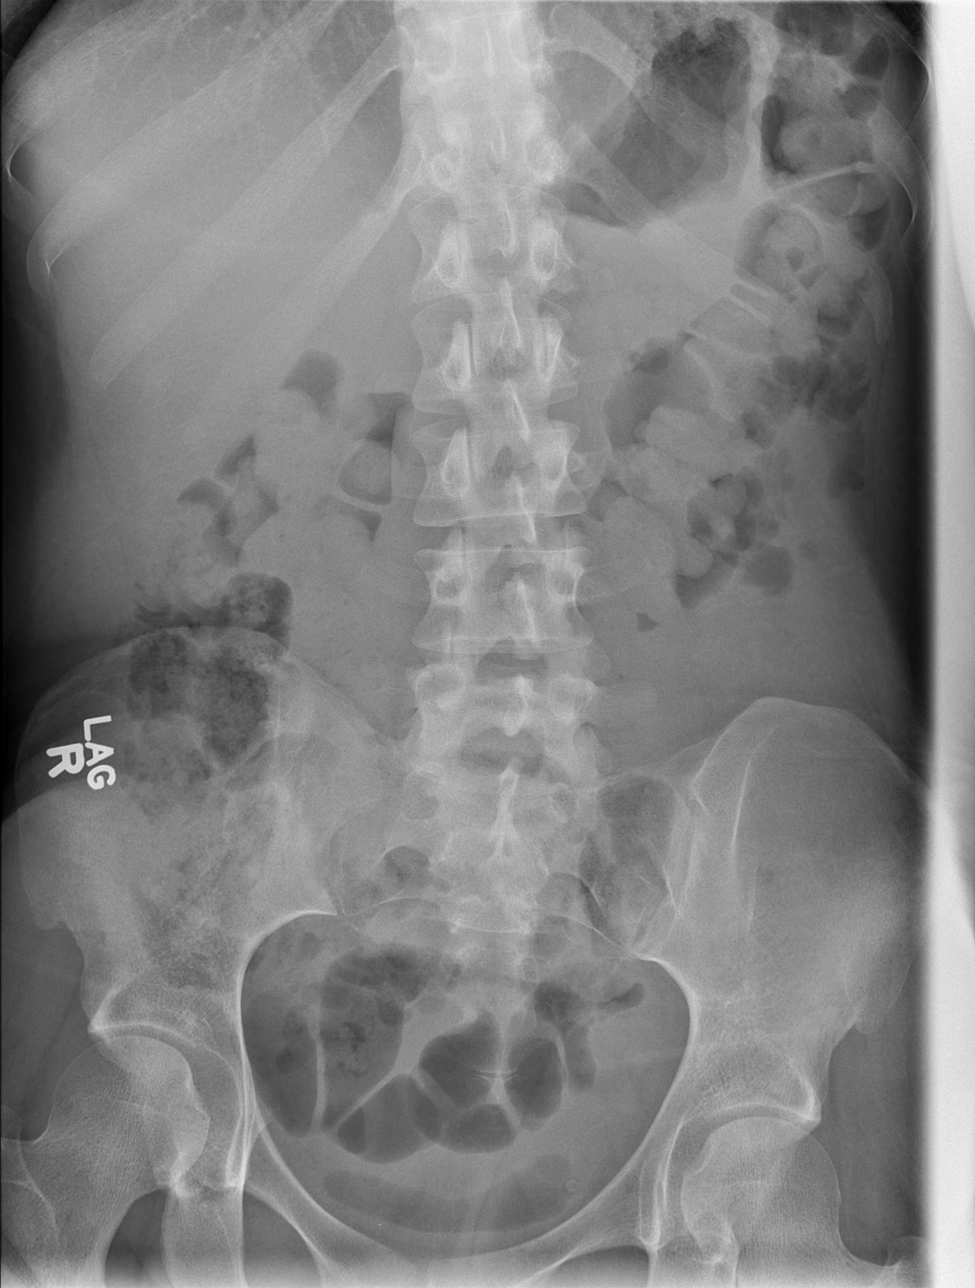

[3 of 3 positions shown; findings below may reference images not displayed]

FINDINGS: Normal heart size and pulmonary vascularity.  No focal
consolidation in the lungs.  No blunting of costophrenic angles.
No significant change since previous study.

Scattered gas and stool in the colon.  No small or large bowel
dilatation.  No free intra-abdominal air.  No abnormal air fluid
levels.  No radiopaque stones.
IMPRESSION: No evidence of active pulmonary disease.  Nonobstructive bowel gas
pattern.

## 2011-10-17 MED ORDER — METOCLOPRAMIDE HCL 5 MG/ML IJ SOLN
10.0000 mg | Freq: Once | INTRAMUSCULAR | Status: AC
  Administered 2011-10-17: 10 mg via INTRAVENOUS
  Filled 2011-10-17: qty 2

## 2011-10-17 MED ORDER — ONDANSETRON HCL 4 MG/2ML IJ SOLN
4.0000 mg | Freq: Once | INTRAMUSCULAR | Status: AC
  Administered 2011-10-17: 4 mg via INTRAVENOUS
  Filled 2011-10-17: qty 2

## 2011-10-17 MED ORDER — HYDROMORPHONE HCL PF 1 MG/ML IJ SOLN
1.0000 mg | Freq: Once | INTRAMUSCULAR | Status: AC
  Administered 2011-10-17: 1 mg via INTRAVENOUS
  Filled 2011-10-17: qty 1

## 2011-10-17 MED ORDER — PROMETHAZINE HCL 25 MG PO TABS: 25.0000 mg | ORAL_TABLET | Freq: Four times a day (QID) | ORAL | Status: DC | PRN

## 2011-10-17 MED ORDER — SODIUM CHLORIDE 0.9 % IV BOLUS (SEPSIS)
1000.0000 mL | Freq: Once | INTRAVENOUS | Status: AC
Start: 2011-10-17 — End: 2011-10-17
  Administered 2011-10-17: 1000 mL via INTRAVENOUS

## 2011-10-17 MED ORDER — HYDROCODONE-ACETAMINOPHEN 5-325 MG PO TABS: 2.0000 | ORAL_TABLET | ORAL | Status: DC | PRN

## 2011-10-17 NOTE — ED Provider Notes (Signed)
Medical screening examination/treatment/procedure(s) were performed by non-physician practitioner and as supervising physician I was immediately available for consultation/collaboration.  Olivia Mackie, MD 10/17/11 [phone]

## 2011-10-17 NOTE — ED Notes (Signed)
Patient transported to X-ray 

## 2011-10-17 NOTE — ED Provider Notes (Signed)
History     CSN: [id number]  Arrival date & time 10/16/11  2312   First MD Initiated Contact with Patient 10/16/11 2347      Chief Complaint  Patient presents with  . Abdominal Pain   HPI  History provided by the patient. Patient is a 21 year old female who presents with continued upper abdominal pain with nausea symptoms. Patient reports having upper abdominal pain and decreased appetite past 3 days. Patient was evaluated for these complaints yesterday in the emergency room and diagnosed with gastritis that time. Pain is located mostly in the epigastric and left upper quadrant area. Patient states she was given prescriptions for antacid medications but was only able to get one of these. She states she has been taking Carafate without any change in symptoms. Patient denies being able to eat or drink anything today. She has persistent nausea as well but denies any episodes of vomiting. She denies any fever, chills, sweats, diarrhea or constipation.    Past Medical History  Diagnosis Date  . Diabetes mellitus   . Hypertension     Past Surgical History  Procedure Date  . Ankle surgery     No family history on file.  History  Substance Use Topics  . Smoking status: Never Smoker   . Smokeless tobacco: Never Used  . Alcohol Use: Yes     Occasional    OB History    Grav Para Term Preterm Abortions TAB SAB Ect Mult Living                  Review of Systems  Constitutional: Positive for appetite change. Negative for fever and chills.  Respiratory: Negative for shortness of breath.   Cardiovascular: Negative for chest pain.  Gastrointestinal: Positive for nausea and abdominal pain. Negative for vomiting, diarrhea and constipation.  Genitourinary: Negative for dysuria, frequency and hematuria.    Allergies  Review of patient's allergies indicates no known allergies.  Home Medications   Current Outpatient Rx  Name Route Sig Dispense Refill  . ENALAPRIL MALEATE 10 MG PO  TABS Oral Take 10 mg by mouth daily.    . INSULIN ASPART 100 UNIT/ML [place] SOLN Subcutaneous Inject 0-15 Units into the skin 3 (three) times daily before meals. 5 u with each meal + sliding scale: CBG 70 - 120: 0 u: CBG 121 - 150: 2 u; CBG 151 - 200: 3 u; CBG 201 - 250: 5 u; CBG 251 - 300: 8 u;CBG 301 - 350: 11 u; CBG 351 - 400: 15 u; CBG > 400 Call MD    . INSULIN GLARGINE 100 UNIT/ML [place] SOLN Subcutaneous Inject 35 Units into the skin at bedtime.    Marland Kitchen METFORMIN HCL 500 MG PO TABS Oral Take 500 mg by mouth 2 (two) times daily with a meal.    . ADULT MULTIVITAMIN W/MINERALS CH Oral Take 1 tablet by mouth daily.    . SUCRALFATE 1 G PO TABS Oral Take 1 tablet (1 g total) by mouth 4 (four) times daily. 40 tablet 0  . OMEPRAZOLE 20 MG PO CPDR Oral Take 1 capsule (20 mg total) by mouth daily.      BP 142/95  Pulse 112  Temp 98.8 F (37.1 C) (Oral)  Resp 18  SpO2 100%  LMP 09/10/2011  Physical Exam  Nursing note and vitals reviewed. Constitutional: She is oriented to person, place, and time. She appears well-developed and well-nourished. No distress.  HENT:  Head: Normocephalic.  Cardiovascular: Normal rate and  regular rhythm.   Pulmonary/Chest: Effort normal and breath sounds normal. No respiratory distress. She has no wheezes.  Abdominal: Soft. Bowel sounds are normal. She exhibits no distension. There is tenderness. There is no rebound and no guarding.       Diffuse tenderness. Pain is greatest in the epigastric left upper quadrant areas. No masses. No hepatosplenomegaly. No CVA tenderness.  Musculoskeletal: Normal range of motion.  Neurological: She is alert and oriented to person, place, and time.  Skin: Skin is warm and dry. No rash noted.  Psychiatric: She has a normal mood and affect. Her behavior is normal.    ED Course  Procedures   Results for orders placed during the hospital encounter of 10/16/11  CBC WITH DIFFERENTIAL      Component Value Range   WBC 3.5 (*) 4.0 - 10.5 K/uL     RBC 4.22  3.87 - 5.11 MIL/uL   Hemoglobin 13.3  12.0 - 15.0 g/dL   HCT 08.6  57.8 - 46.9 %   MCV 85.3  78.0 - 100.0 fL   MCH 31.5  26.0 - 34.0 pg   MCHC 36.9 (*) 30.0 - 36.0 g/dL   RDW 62.9 (*) 52.8 - 41.3 %   Platelets 207  150 - 400 K/uL   Neutrophils Relative 29 (*) 43 - 77 %   Neutro Abs 1.0 (*) 1.7 - 7.7 K/uL   Lymphocytes Relative 60 (*) 12 - 46 %   Lymphs Abs 2.1  0.7 - 4.0 K/uL   Monocytes Relative 9  3 - 12 %   Monocytes Absolute 0.3  0.1 - 1.0 K/uL   Eosinophils Relative 1  0 - 5 %   Eosinophils Absolute 0.0  0.0 - 0.7 K/uL   Basophils Relative 1  0 - 1 %   Basophils Absolute 0.0  0.0 - 0.1 K/uL  URINALYSIS, ROUTINE W REFLEX MICROSCOPIC      Component Value Range   Color, Urine YELLOW  YELLOW   APPearance CLEAR  CLEAR   Specific Gravity, Urine 1.027  1.005 - 1.030   pH 6.5  5.0 - 8.0   Glucose, UA >1000 (*) NEGATIVE mg/dL   Hgb urine dipstick NEGATIVE  NEGATIVE   Bilirubin Urine NEGATIVE  NEGATIVE   Ketones, ur 40 (*) NEGATIVE mg/dL   Protein, ur NEGATIVE  NEGATIVE mg/dL   Urobilinogen, UA 1.0  0.0 - 1.0 mg/dL   Nitrite NEGATIVE  NEGATIVE   Leukocytes, UA NEGATIVE  NEGATIVE  COMPREHENSIVE METABOLIC PANEL      Component Value Range   Sodium 134 (*) 135 - 145 mEq/L   Potassium 3.7  3.5 - 5.1 mEq/L   Chloride 98  96 - 112 mEq/L   CO2 23  19 - 32 mEq/L   Glucose, Bld 256 (*) 70 - 99 mg/dL   BUN 9  6 - 23 mg/dL   Creatinine, Ser 2.44 (*) 0.50 - 1.10 mg/dL   Calcium 9.3  8.4 - 01.0 mg/dL   Total Protein 7.5  6.0 - 8.3 g/dL   Albumin 3.9  3.5 - 5.2 g/dL   AST 14  0 - 37 U/L   ALT 15  0 - 35 U/L   Alkaline Phosphatase 68  39 - 117 U/L   Total Bilirubin 0.5  0.3 - 1.2 mg/dL   GFR calc non Af Amer >90  >90 mL/min   GFR calc Af Amer >90  >90 mL/min  LIPASE, BLOOD      Component Value  Range   Lipase 19  11 - 59 U/L  URINE MICROSCOPIC-ADD ON      Component Value Range   Squamous Epithelial / LPF RARE  RARE   WBC, UA 0-2  <3 WBC/hpf   RBC / HPF 0-2  <3 RBC/hpf    Bacteria, UA MANY (*) RARE   Urine-Other MUCOUS PRESENT        Dg Abd Acute W/chest  10/17/2011  *RADIOLOGY REPORT*  Clinical Data: Left upper abdominal pain.  Fever and nausea.  ACUTE ABDOMEN SERIES (ABDOMEN 2 VIEW & CHEST 1 VIEW)  Comparison: Chest 09/18/2011.  CT abdomen and pelvis 09/18/2011.  Findings: Normal heart size and pulmonary vascularity.  No focal consolidation in the lungs.  No blunting of costophrenic angles. No significant change since previous study.  Scattered gas and stool in the colon.  No small or large bowel dilatation.  No free intra-abdominal air.  No abnormal air fluid levels.  No radiopaque stones.  IMPRESSION: No evidence of active pulmonary disease.  Nonobstructive bowel gas pattern.  Original Report Authenticated By: Marlon Pel, M.D.     1. Abdominal pain   2. Nausea       MDM  11:50PM patient seen and evaluated. Patient appears uncomfortable. Patient with similar symptoms recently. Patient also with nonacute CT scan several weeks ago.  1:30AM Patient reports feeling much better after medications. Patient with slightly elevated blood sugar. Anion gap normal labs otherwise unremarkable. Heart rate has improved. At this time patient requesting to return home. She will plan to followup with PCP, Dr. Phineas Real.      Angus Seller, [place] 10/17/11 [phone]

## 2011-10-22 ENCOUNTER — Encounter (HOSPITAL_COMMUNITY): Payer: Self-pay | Admitting: *Deleted

## 2011-10-22 ENCOUNTER — Emergency Department (HOSPITAL_COMMUNITY)
Admission: EM | Admit: 2011-10-22 | Discharge: 2011-10-22 | Disposition: A | Discharge status: Home / Self Care | Payer: 59 | Attending: Emergency Medicine | Admitting: Emergency Medicine

## 2011-10-22 ENCOUNTER — Emergency Department (HOSPITAL_COMMUNITY): Payer: 59

## 2011-10-22 DIAGNOSIS — E119 Type 2 diabetes mellitus without complications: Secondary | ICD-10-CM | POA: Insufficient documentation

## 2011-10-22 DIAGNOSIS — R109 Unspecified abdominal pain: Secondary | ICD-10-CM

## 2011-10-22 DIAGNOSIS — Z794 Long term (current) use of insulin: Secondary | ICD-10-CM | POA: Insufficient documentation

## 2011-10-22 DIAGNOSIS — I10 Essential (primary) hypertension: Secondary | ICD-10-CM | POA: Insufficient documentation

## 2011-10-22 LAB — COMPREHENSIVE METABOLIC PANEL
ALT: 17 U/L (ref 0–35)
AST: 18 U/L (ref 0–37)
Albumin: 4.1 g/dL (ref 3.5–5.2)
Alkaline Phosphatase: 63 U/L (ref 39–117)
BUN: 8 mg/dL (ref 6–23)
CO2: 26 mEq/L (ref 19–32)
Calcium: 9.3 mg/dL (ref 8.4–10.5)
Chloride: 98 mEq/L (ref 96–112)
Creatinine, Ser: 0.41 mg/dL — ABNORMAL LOW (ref 0.50–1.10)
GFR calc Af Amer: >90 mL/min (ref >90)
GFR calc non Af Amer: >90 mL/min (ref >90)
Glucose, Bld: 306 mg/dL — ABNORMAL HIGH (ref 70–99)
Potassium: 3.6 mEq/L (ref 3.5–5.1)
Sodium: 133 mEq/L — ABNORMAL LOW (ref 135–145)
Total Bilirubin: 0.5 mg/dL (ref 0.3–1.2)
Total Protein: 7.7 g/dL (ref 6.0–8.3)

## 2011-10-22 LAB — CBC
HCT: 37.6 % (ref 36.0–46.0)
Hemoglobin: 13.6 g/dL (ref 12.0–15.0)
MCH: 30.6 pg (ref 26.0–34.0)
MCHC: 36.2 g/dL — ABNORMAL HIGH (ref 30.0–36.0)
MCV: 84.7 fL (ref 78.0–100.0)
Platelets: 248 10*3/uL (ref 150–400)
RBC: 4.44 MIL/uL (ref 3.87–5.11)
RDW: 11.3 % — ABNORMAL LOW (ref 11.5–15.5)
WBC: 3.8 10*3/uL — ABNORMAL LOW (ref 4.0–10.5)

## 2011-10-22 LAB — DIFFERENTIAL
Basophils Absolute: 0 10*3/uL (ref 0.0–0.1)
Basophils Relative: 0 % (ref 0–1)
Eosinophils Absolute: 0 10*3/uL (ref 0.0–0.7)
Eosinophils Relative: 1 % (ref 0–5)
Lymphocytes Relative: 63 % — ABNORMAL HIGH (ref 12–46)
Lymphs Abs: 2.4 10*3/uL (ref 0.7–4.0)
Monocytes Absolute: 0.2 10*3/uL (ref 0.1–1.0)
Monocytes Relative: 6 % (ref 3–12)
Neutro Abs: 1.2 10*3/uL — ABNORMAL LOW (ref 1.7–7.7)
Neutrophils Relative %: 31 % — ABNORMAL LOW (ref 43–77)

## 2011-10-22 LAB — URINALYSIS, ROUTINE W REFLEX MICROSCOPIC
Bilirubin Urine: NEGATIVE
Glucose, UA: >1000 mg/dL — AB
Hgb urine dipstick: NEGATIVE
Ketones, ur: 15 mg/dL — AB
Leukocytes, UA: NEGATIVE
Nitrite: NEGATIVE
Protein, ur: NEGATIVE mg/dL
Specific Gravity, Urine: 1.031 — ABNORMAL HIGH (ref 1.005–1.030)
Urobilinogen, UA: 0.2 mg/dL (ref 0.0–1.0)
pH: 6 (ref 5.0–8.0)

## 2011-10-22 LAB — URINE MICROSCOPIC-ADD ON

## 2011-10-22 LAB — GLUCOSE, CAPILLARY: Glucose-Capillary: 197 mg/dL — ABNORMAL HIGH (ref 70–99)

## 2011-10-22 LAB — PREGNANCY, URINE: Preg Test, Ur: NEGATIVE

## 2011-10-22 LAB — LIPASE, BLOOD: Lipase: 24 U/L (ref 11–59)

## 2011-10-22 IMAGING — CT CT ABD-PELV W/ CM
1 of 2 series · 15 of 32 positions shown, 19 images · IV contrast (100 ML OMNI 300)
Comparison: Multiple exams, including [DATE] and [DATE]

CLINICAL DATA: Increasing abdominal pain.

CT ABDOMEN AND PELVIS WITH CONTRAST
TECHNIQUE: Multidetector CT imaging of the abdomen and pelvis was
performed following the standard protocol during bolus
administration of intravenous contrast.
Contrast: 100mL OMNIPAQUE IOHEXOL 300 MG/ML  SOLN

[Series 2: abd/pel with · axial · 0.57mm/px · z∈[+1195,+1590]mm · 15 of 87 slices shown, 19 images]
[im 4/87  soft-tissue]
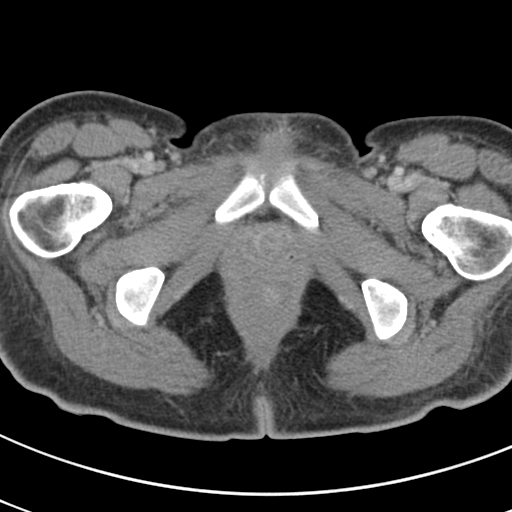
[im 4/87  bone]
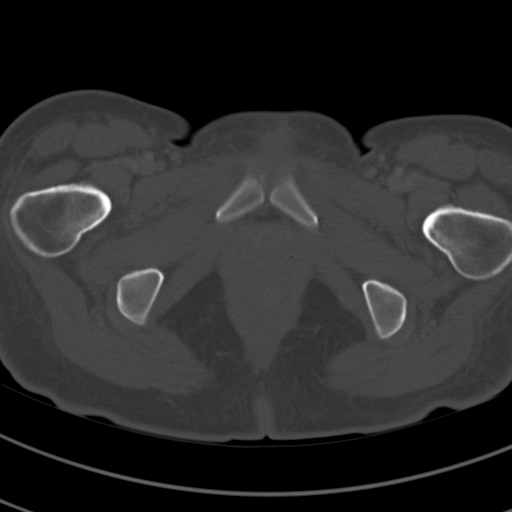
[im 11/87  soft-tissue]
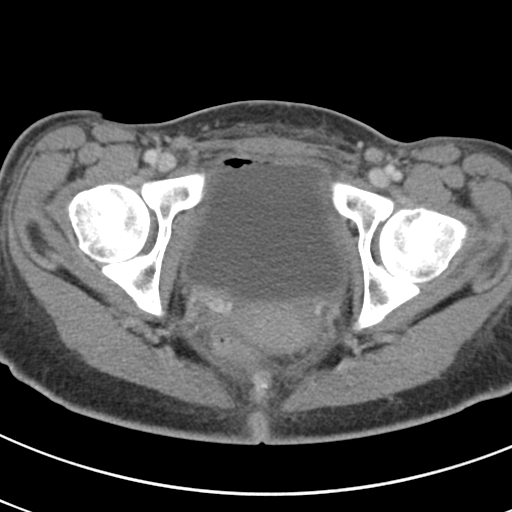
[im 18/87  soft-tissue]
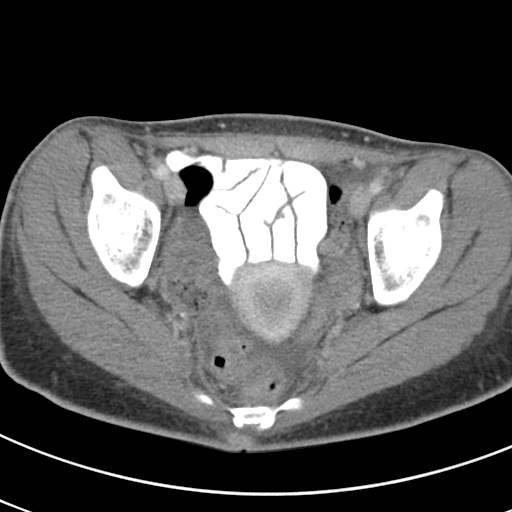
[im 25/87  soft-tissue]
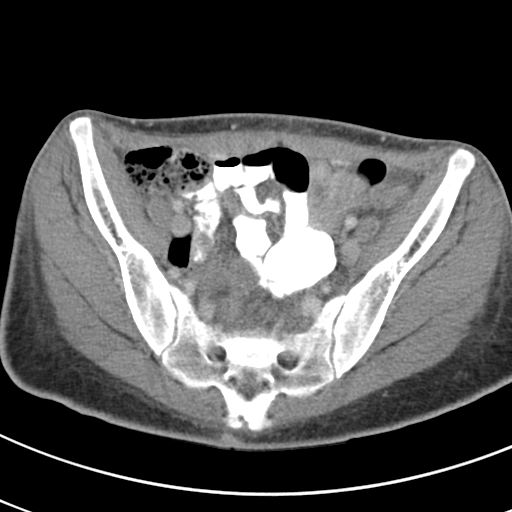
[im 31/87  soft-tissue]
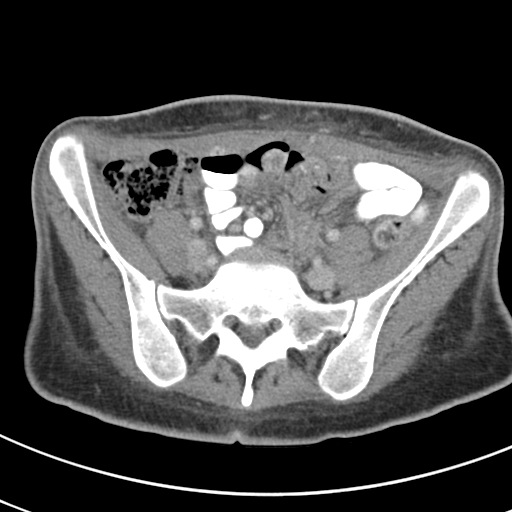
[im 38/87  soft-tissue]
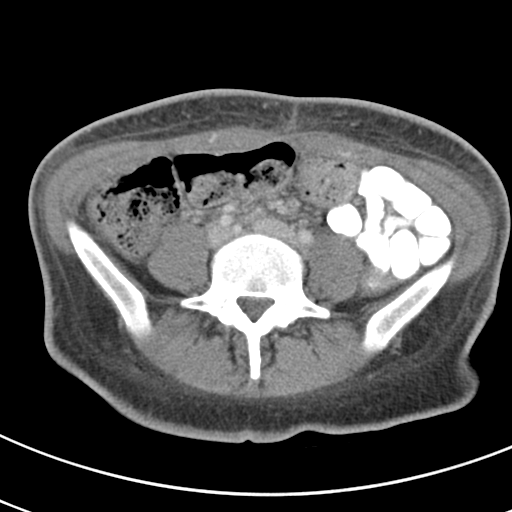
[im 45/87  soft-tissue]
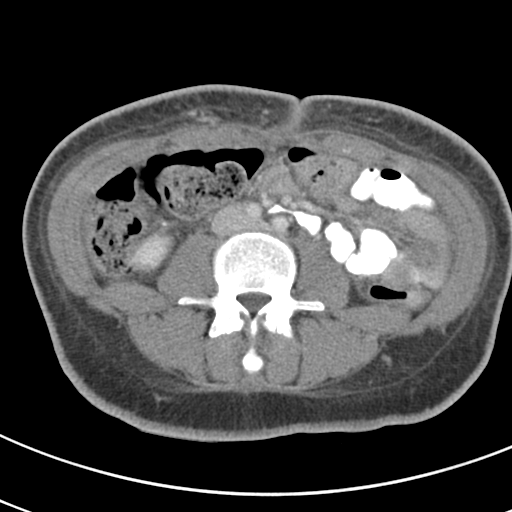
[im 49/87  soft-tissue]
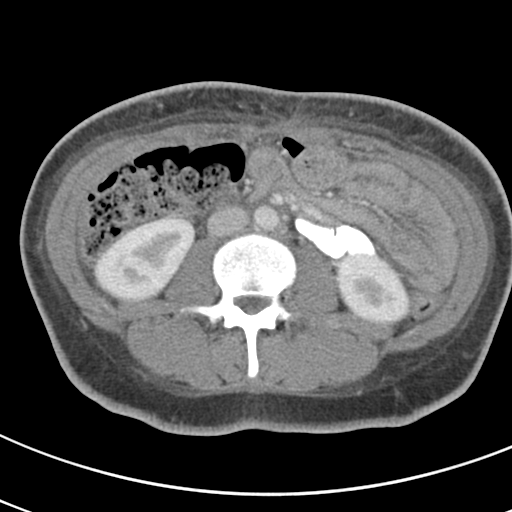
[im 56/87  soft-tissue]
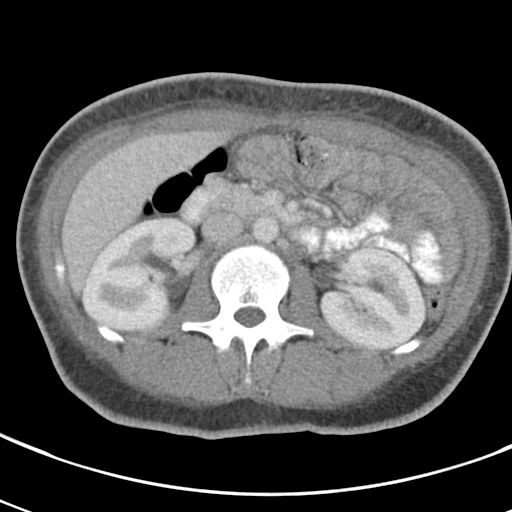
[im 56/87  bone]
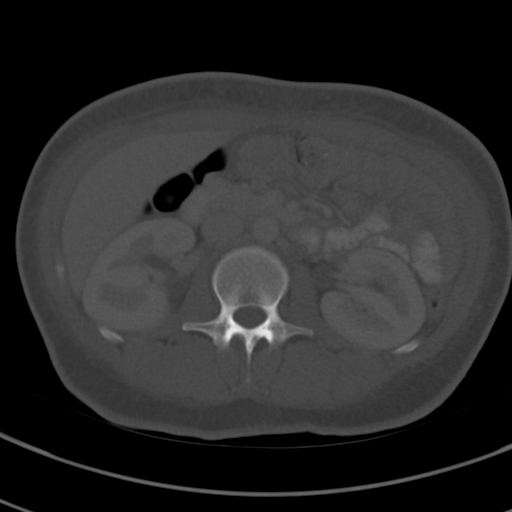
[im 62/87  soft-tissue]
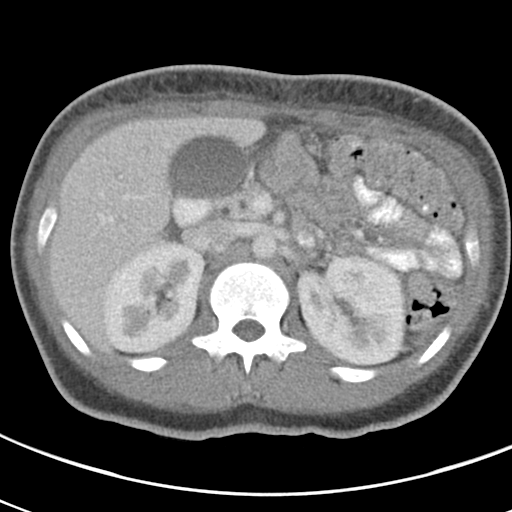
[im 69/87  soft-tissue]
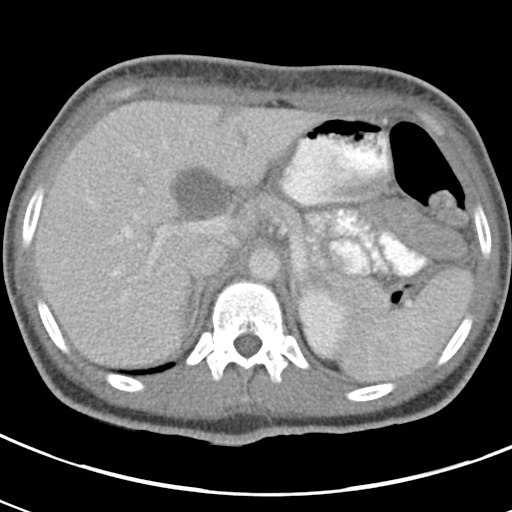
[im 73/87  lung]
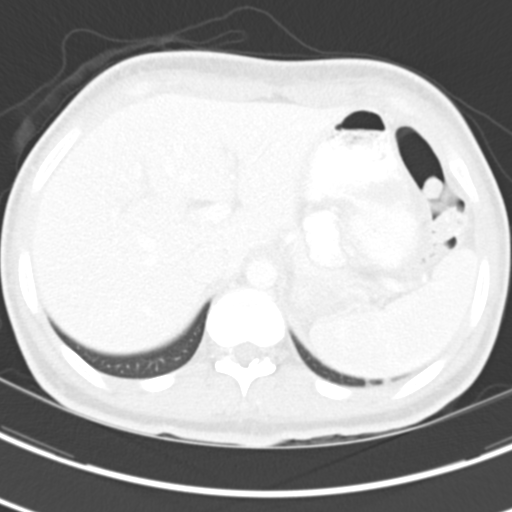
[im 76/87  soft-tissue]
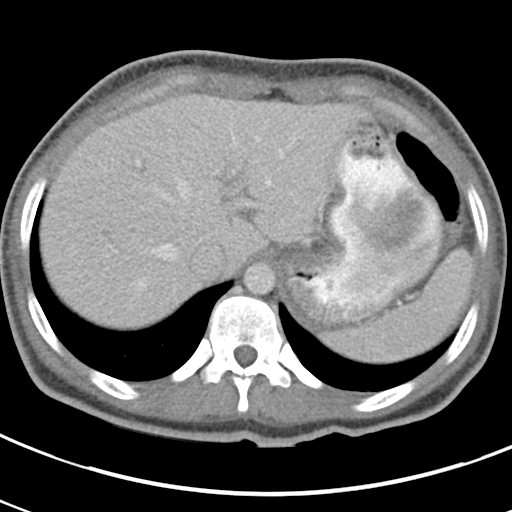
[im 76/87  lung]
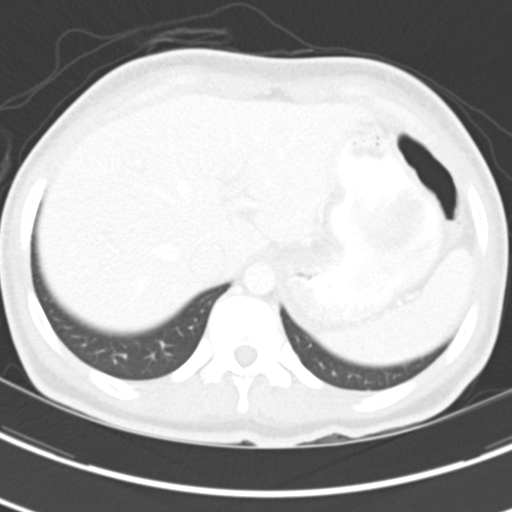
[im 80/87  lung]
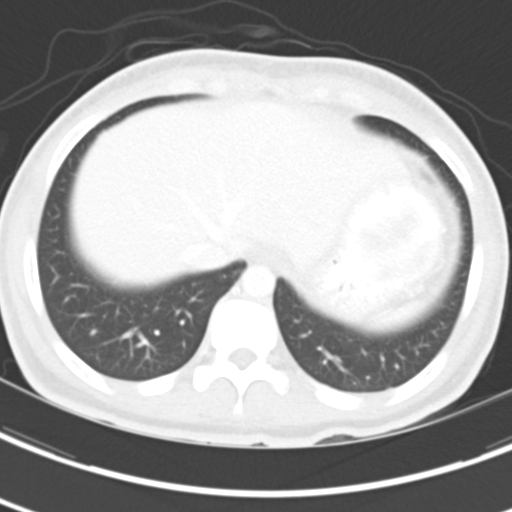
[im 83/87  soft-tissue]
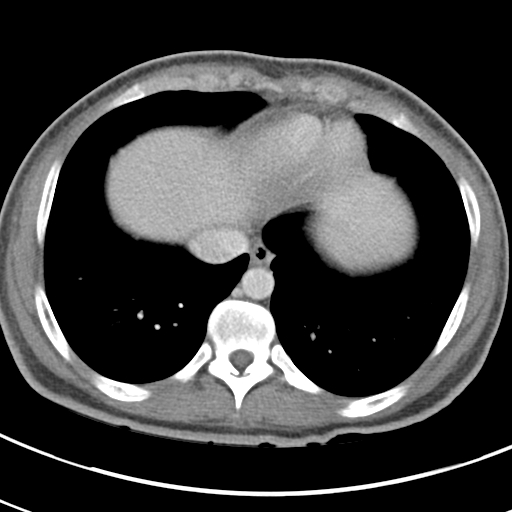
[im 83/87  lung]
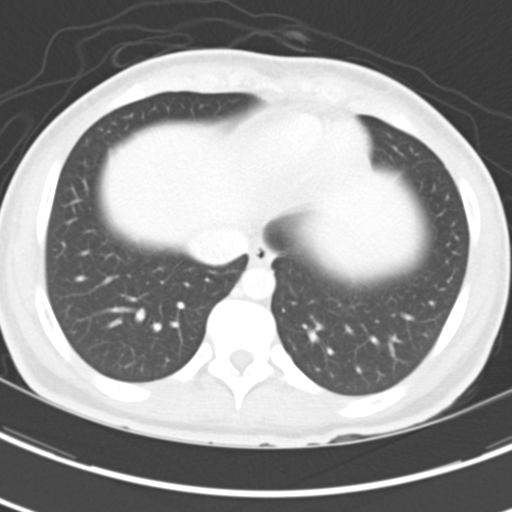

[15 of 32 positions shown; findings below may reference images not displayed]

FINDINGS: Again noted is diffuse subcutaneous and mesenteric edema.
Mild nonspecific periportal edema is noted in the liver.  The
spleen, pancreas, and adrenal glands appear unremarkable.  The
gallbladder appears normal.

No dilated bowel noted.  Orally administered contrast extends to
the distal small bowel.

The kidneys appear unremarkable, as do the proximal ureters.

Gas is present in the urinary bladder, likely from recent
catheterization.  A small amount of free pelvic fluid is present in
the cul-de-sac.

The appendix is not well seen.  This lowers negative predictive
value for acute appendicitis

A right external iliac node measures 8 mm in short axis.
IMPRESSION: 1.  Diffuse subcutaneous and mesenteric edema, without pleural
effusions.  Low-level nonspecific periportal edema is noted.  On
today's labs the patient is not hypoalbuminemic or hypoproteinemic.
The cause of this third spacing of fluid is uncertain.  There is a
small amount of pelvic ascites in the cul-de-sac.  Currently renal
enhancement appears normal.
2.  Nonvisualization of the appendix.

## 2011-10-22 MED ORDER — HYDROMORPHONE HCL PF 1 MG/ML IJ SOLN
0.5000 mg | Freq: Once | INTRAMUSCULAR | Status: AC
  Administered 2011-10-22: 0.5 mg via INTRAVENOUS
  Filled 2011-10-22: qty 1

## 2011-10-22 MED ORDER — SODIUM CHLORIDE 0.9 % IV SOLN
1000.0000 mL | Freq: Once | INTRAVENOUS | Status: AC
  Administered 2011-10-22: 1000 mL via INTRAVENOUS

## 2011-10-22 MED ORDER — OMEPRAZOLE 20 MG PO CPDR: 20.0000 mg | DELAYED_RELEASE_CAPSULE | Freq: Every day | ORAL | Status: DC

## 2011-10-22 MED ORDER — SODIUM CHLORIDE 0.9 % IV SOLN
80.0000 mg | Freq: Once | INTRAVENOUS | Status: AC
  Administered 2011-10-22: 80 mg via INTRAVENOUS
  Filled 2011-10-22: qty 80

## 2011-10-22 MED ORDER — HYDROCODONE-ACETAMINOPHEN 5-325 MG PO TABS: 2.0000 | ORAL_TABLET | ORAL | Status: DC | PRN

## 2011-10-22 MED ORDER — INSULIN REGULAR HUMAN 100 UNIT/ML IJ SOLN: 10.0000 [IU] | Freq: Once | INTRAMUSCULAR | Status: DC

## 2011-10-22 MED ORDER — GI COCKTAIL ~~LOC~~
30.0000 mL | Freq: Once | ORAL | Status: AC
  Administered 2011-10-22: 30 mL via ORAL
  Filled 2011-10-22: qty 30

## 2011-10-22 MED ORDER — METOCLOPRAMIDE HCL 5 MG/ML IJ SOLN
10.0000 mg | Freq: Once | INTRAMUSCULAR | Status: AC
  Administered 2011-10-22: 10 mg via INTRAVENOUS
  Filled 2011-10-22: qty 2

## 2011-10-22 MED ORDER — SODIUM CHLORIDE 0.9 % IV SOLN
1000.0000 mL | INTRAVENOUS | Status: DC
  Administered 2011-10-22: 1000 mL via INTRAVENOUS

## 2011-10-22 MED ORDER — INSULIN ASPART 100 UNIT/ML ~~LOC~~ SOLN
10.0000 [IU] | Freq: Once | SUBCUTANEOUS | Status: AC
  Administered 2011-10-22: 10 [IU] via SUBCUTANEOUS

## 2011-10-22 MED ORDER — METOCLOPRAMIDE HCL 10 MG PO TABS: 10.0000 mg | ORAL_TABLET | Freq: Four times a day (QID) | ORAL | Status: DC

## 2011-10-22 MED ORDER — INSULIN ASPART 100 UNIT/ML ~~LOC~~ SOLN
SUBCUTANEOUS | Status: AC
  Administered 2011-10-22: 10 [IU] via SUBCUTANEOUS
  Filled 2011-10-22: qty 1

## 2011-10-22 MED ORDER — PANTOPRAZOLE SODIUM 40 MG IV SOLR: 80.0000 mg | Freq: Once | INTRAVENOUS | Status: DC

## 2011-10-22 MED ORDER — IOHEXOL 300 MG/ML  SOLN
100.0000 mL | Freq: Once | INTRAMUSCULAR | Status: AC | PRN
  Administered 2011-10-22: 100 mL via INTRAVENOUS

## 2011-10-22 NOTE — ED Notes (Signed)
Pt c/o abd pain x 10 days; previous tx for same and dx with gastroenteritis; no nausea on arrival

## 2011-10-23 NOTE — ED Provider Notes (Signed)
History     CSN: [id number]  Arrival date & time 10/22/11  0146   First MD Initiated Contact with Patient 10/22/11 0253      Chief Complaint  Patient presents with  . Abdominal Pain    (Consider location/radiation/quality/duration/timing/severity/associated sxs/prior treatment) HPI 21 yo female presents to the ER with complaint of continued abdominal pain, nausea.  Pt has been seen in the ER twice in the past 10 days as well as having admission last month for similar sxs.  Pt was thought to have food poisoning last month, and gastritis/gastroenteritis on prior visits.  Pt was given rx for carafate and prilosec on first ED visit, has not been able to fill prilosec due to error in prescription.  Pt has h/o diabetes, reports fair control with blood sugars usually running 100-150.  Pt has had decreased appetitie and early satiety.  No fevers, no urinary symptoms, headaches, vaginal d/c.  LMP two weeks ago.  Pt has f/u with pcm on Friday.  No prior h/o gastroparesis, PUD, gallbladder disease.  Pain is mainly in epigastrium and periumbilical.  No prior abd surgeries. Past Medical History  Diagnosis Date  . Diabetes mellitus   . Hypertension     Past Surgical History  Procedure Date  . Ankle surgery     No family history on file.  History  Substance Use Topics  . Smoking status: Never Smoker   . Smokeless tobacco: Never Used  . Alcohol Use: Yes     Occasional    OB History    Grav Para Term Preterm Abortions TAB SAB Ect Mult Living                  Review of Systems  All other systems reviewed and are negative.  other than listed in HPI  Allergies  Review of patient's allergies indicates no known allergies.  Home Medications   Current Outpatient Rx  Name Route Sig Dispense Refill  . ENALAPRIL MALEATE 10 MG PO TABS Oral Take 10 mg by mouth daily.    . INSULIN ASPART 100 UNIT/ML [place] SOLN Subcutaneous Inject 0-15 Units into the skin 3 (three) times daily before meals. 5  u with each meal + sliding scale: CBG 70 - 120: 0 u: CBG 121 - 150: 2 u; CBG 151 - 200: 3 u; CBG 201 - 250: 5 u; CBG 251 - 300: 8 u;CBG 301 - 350: 11 u; CBG 351 - 400: 15 u; CBG > 400 Call MD    . INSULIN GLARGINE 100 UNIT/ML [place] SOLN Subcutaneous Inject 35 Units into the skin at bedtime.    Marland Kitchen METFORMIN HCL 500 MG PO TABS Oral Take 500 mg by mouth 2 (two) times daily with a meal.    . ADULT MULTIVITAMIN W/MINERALS CH Oral Take 1 tablet by mouth daily.    Marland Kitchen PROMETHAZINE HCL 25 MG PO TABS Oral Take 1 tablet (25 mg total) by mouth every 6 (six) hours as needed for nausea. 30 tablet 0  . SUCRALFATE 1 G PO TABS Oral Take 1 tablet (1 g total) by mouth 4 (four) times daily. 40 tablet 0  . HYDROCODONE-ACETAMINOPHEN 5-325 MG PO TABS Oral Take 2 tablets by mouth every 4 (four) hours as needed for pain. 20 tablet 0  . METOCLOPRAMIDE HCL 10 MG PO TABS Oral Take 1 tablet (10 mg total) by mouth every 6 (six) hours. 30 tablet 0  . OMEPRAZOLE 20 MG PO CPDR Oral Take 1 capsule (20 mg total)  by mouth daily. 30 capsule 0    BP 126/70  Pulse 76  Temp 98 F (36.7 C)  Resp 18  SpO2 98%  LMP 10/15/2011  Physical Exam  Nursing note and vitals reviewed. Constitutional: She is oriented to person, place, and time. She appears well-developed and well-nourished. She appears distressed (uncomfortable appearing).  HENT:  Head: Normocephalic and atraumatic.  Nose: Nose normal.  Mouth/Throat: Oropharynx is clear and moist.  Eyes: Conjunctivae and EOM are normal. Pupils are equal, round, and reactive to light.  Neck: Normal range of motion. Neck supple. No JVD present. No tracheal deviation present. No thyromegaly present.  Cardiovascular: Normal rate, regular rhythm, normal heart sounds and intact distal pulses.  Exam reveals no gallop and no friction rub.   No murmur heard. Pulmonary/Chest: Effort normal and breath sounds normal. No stridor. No respiratory distress. She has no wheezes. She has no rales. She exhibits no  tenderness.  Abdominal: Soft. Bowel sounds are normal. She exhibits no distension and no mass. There is tenderness (diffusley tender worse in epigastrium ). There is no rebound and no guarding.  Musculoskeletal: Normal range of motion. She exhibits no edema and no tenderness.  Lymphadenopathy:    She has no cervical adenopathy.  Neurological: She is alert and oriented to person, place, and time. She exhibits normal muscle tone. Coordination normal.  Skin: Skin is dry. No rash noted. No erythema. No pallor.  Psychiatric: She has a normal mood and affect. Her behavior is normal. Judgment and thought content normal.    ED Course  Procedures (including critical care time)  Labs Reviewed  CBC - Abnormal; Notable for the following:    WBC 3.8 (*)     MCHC 36.2 (*)     RDW 11.3 (*)     All other components within normal limits  DIFFERENTIAL - Abnormal; Notable for the following:    Neutrophils Relative 31 (*)     Neutro Abs 1.2 (*)     Lymphocytes Relative 63 (*)     All other components within normal limits  COMPREHENSIVE METABOLIC PANEL - Abnormal; Notable for the following:    Sodium 133 (*)     Glucose, Bld 306 (*)     Creatinine, Ser 0.41 (*)     All other components within normal limits  URINALYSIS, ROUTINE W REFLEX MICROSCOPIC - Abnormal; Notable for the following:    Specific Gravity, Urine 1.031 (*)     Glucose, UA >1000 (*)     Ketones, ur 15 (*)     All other components within normal limits  GLUCOSE, CAPILLARY - Abnormal; Notable for the following:    Glucose-Capillary 197 (*)     All other components within normal limits  LIPASE, BLOOD  PREGNANCY, URINE  URINE MICROSCOPIC-ADD ON  LAB REPORT - SCANNED   Ct Abdomen Pelvis W Contrast  10/22/2011  *RADIOLOGY REPORT*  Clinical Data: Increasing abdominal pain.  CT ABDOMEN AND PELVIS WITH CONTRAST  Technique:  Multidetector CT imaging of the abdomen and pelvis was performed following the standard protocol during bolus  administration of intravenous contrast.  Contrast: OMNIPAQUE IOHEXOL 300 MG/ML  SOLN  Comparison: Multiple exams, including 10/17/2011 and 09/18/2011  Findings: Again noted is diffuse subcutaneous and mesenteric edema. Mild nonspecific periportal edema is noted in the liver.  The spleen, pancreas, and adrenal glands appear unremarkable.  The gallbladder appears normal.  No dilated bowel noted.  Orally administered contrast extends to the distal small bowel.  The kidneys  appear unremarkable, as do the proximal ureters.  Gas is present in the urinary bladder, likely from recent catheterization.  A small amount of free pelvic fluid is present in the cul-de-sac.  The appendix is not well seen.  This lowers negative predictive value for acute appendicitis  A right external iliac node measures 8 mm in short axis.  IMPRESSION:  1.  Diffuse subcutaneous and mesenteric edema, without pleural effusions.  Low-level nonspecific periportal edema is noted.  On today's labs the patient is not hypoalbuminemic or hypoproteinemic. The cause of this third spacing of fluid is uncertain.  There is a small amount of pelvic ascites in the cul-de-sac.  Currently renal enhancement appears normal. 2.  Nonvisualization of the appendix.  Original Report Authenticated By: Dellia Cloud, M.D.     1. Abdominal pain       MDM  21 yo female with ongoing abd pain.  Possibly gastritis, but gastroparesis in differential as well.  Pt CT scan shows edema of mesentery, subcutaneous tissues, with ascites in cul de sac.  Unclear cause of this edema.  Will start patient on reglan, prilosec and have her follow up as scheduled on Friday with her PCM for further workup        Olivia Mackie, MD 10/23/11 1254

## 2011-10-28 ENCOUNTER — Encounter (HOSPITAL_COMMUNITY): Payer: Self-pay | Admitting: *Deleted

## 2011-10-28 ENCOUNTER — Emergency Department (HOSPITAL_COMMUNITY)
Admission: EM | Admit: 2011-10-28 | Discharge: 2011-10-28 | Disposition: A | Discharge status: Home / Self Care | Payer: 59 | Attending: Emergency Medicine | Admitting: Emergency Medicine

## 2011-10-28 DIAGNOSIS — I10 Essential (primary) hypertension: Secondary | ICD-10-CM | POA: Insufficient documentation

## 2011-10-28 DIAGNOSIS — K2990 Gastroduodenitis, unspecified, without bleeding: Secondary | ICD-10-CM | POA: Insufficient documentation

## 2011-10-28 DIAGNOSIS — Z794 Long term (current) use of insulin: Secondary | ICD-10-CM | POA: Insufficient documentation

## 2011-10-28 DIAGNOSIS — R112 Nausea with vomiting, unspecified: Secondary | ICD-10-CM

## 2011-10-28 DIAGNOSIS — E119 Type 2 diabetes mellitus without complications: Secondary | ICD-10-CM | POA: Insufficient documentation

## 2011-10-28 DIAGNOSIS — R10819 Abdominal tenderness, unspecified site: Secondary | ICD-10-CM | POA: Insufficient documentation

## 2011-10-28 DIAGNOSIS — K2970 Gastritis, unspecified, without bleeding: Secondary | ICD-10-CM

## 2011-10-28 LAB — CBC WITH DIFFERENTIAL/PLATELET
Basophils Absolute: 0 10*3/uL (ref 0.0–0.1)
Basophils Relative: 1 % (ref 0–1)
Eosinophils Absolute: 0 10*3/uL (ref 0.0–0.7)
Eosinophils Relative: 0 % (ref 0–5)
HCT: 37.5 % (ref 36.0–46.0)
Hemoglobin: 13.8 g/dL (ref 12.0–15.0)
Lymphocytes Relative: 46 % (ref 12–46)
Lymphs Abs: 1.5 10*3/uL (ref 0.7–4.0)
MCH: 31.2 pg (ref 26.0–34.0)
MCHC: 36.8 g/dL — ABNORMAL HIGH (ref 30.0–36.0)
MCV: 84.8 fL (ref 78.0–100.0)
Monocytes Absolute: 0.2 10*3/uL (ref 0.1–1.0)
Monocytes Relative: 6 % (ref 3–12)
Neutro Abs: 1.5 10*3/uL — ABNORMAL LOW (ref 1.7–7.7)
Neutrophils Relative %: 47 % (ref 43–77)
Platelets: 274 10*3/uL (ref 150–400)
RBC: 4.42 MIL/uL (ref 3.87–5.11)
RDW: 11.4 % — ABNORMAL LOW (ref 11.5–15.5)
WBC: 3.2 10*3/uL — ABNORMAL LOW (ref 4.0–10.5)

## 2011-10-28 LAB — BASIC METABOLIC PANEL
BUN: 7 mg/dL (ref 6–23)
CO2: 27 mEq/L (ref 19–32)
Calcium: 9.7 mg/dL (ref 8.4–10.5)
Chloride: 97 mEq/L (ref 96–112)
Creatinine, Ser: 0.44 mg/dL — ABNORMAL LOW (ref 0.50–1.10)
GFR calc Af Amer: >90 mL/min (ref >90)
GFR calc non Af Amer: >90 mL/min (ref >90)
Glucose, Bld: 246 mg/dL — ABNORMAL HIGH (ref 70–99)
Potassium: 3.8 mEq/L (ref 3.5–5.1)
Sodium: 135 mEq/L (ref 135–145)

## 2011-10-28 LAB — GLUCOSE, CAPILLARY
Glucose-Capillary: 219 mg/dL — ABNORMAL HIGH (ref 70–99)
Glucose-Capillary: 177 mg/dL — ABNORMAL HIGH (ref 70–99)

## 2011-10-28 MED ORDER — HYDROCODONE-ACETAMINOPHEN 5-325 MG PO TABS: ORAL_TABLET | ORAL | Status: DC

## 2011-10-28 MED ORDER — GI COCKTAIL ~~LOC~~
30.0000 mL | Freq: Once | ORAL | Status: AC
  Administered 2011-10-28: 30 mL via ORAL
  Filled 2011-10-28: qty 30

## 2011-10-28 MED ORDER — METOCLOPRAMIDE HCL 5 MG/ML IJ SOLN
10.0000 mg | Freq: Once | INTRAMUSCULAR | Status: AC
  Administered 2011-10-28: 10 mg via INTRAVENOUS
  Filled 2011-10-28: qty 2

## 2011-10-28 MED ORDER — SODIUM CHLORIDE 0.9 % IV BOLUS (SEPSIS)
1000.0000 mL | Freq: Once | INTRAVENOUS | Status: AC
  Administered 2011-10-28: 1000 mL via INTRAVENOUS

## 2011-10-28 MED ORDER — FENTANYL CITRATE 0.05 MG/ML IJ SOLN
100.0000 ug | Freq: Once | INTRAMUSCULAR | Status: AC
  Administered 2011-10-28: 100 ug via INTRAVENOUS
  Filled 2011-10-28: qty 2

## 2011-10-28 NOTE — ED Notes (Signed)
Urine collected and placed at bedside.  

## 2011-10-28 NOTE — ED Notes (Signed)
Pt reports abd pain, n/v/d for last "couple of weeks." Per chart, seen here multiple times for same with Dx gastritis.

## 2011-10-28 NOTE — Discharge Instructions (Signed)
 Gastritis Gastritis is an irritation of the stomach. This is often caused by medications, but can be from anything that bothers the stomach. Other stomach irritants are:  Alcohol .   Caffeine .   Nicotine.   Spicy or acid foods.   Medications for pain and arthritis. Aspirin  and other anti-inflammatory medicines such as ibuprofen  (Advil ), naproxen  (Aleve ), and ketoprofen (Orudis) can be highly irritating.   Emotional distress.  Symptoms of gastritis may include:  Abdominal pain.   Indigestion.   Nausea and or vomiting.   Bleeding.  Some patients with chronic gastritis and ulcers have been infected by a germ. They may need special testing. Medications which kill germs can be used to cure this condition. Treatment includes avoiding the substances mentioned above that are known to cause stomach trouble. Medications used to treat gastritis can include:  Antacids.   Medicines to control vomiting.   Acid blocking medicines.  Symptoms of gastritis usually improve within 2-3 days of starting treatment. Call your caregiver if you are not better in a few days. SEEK MEDICAL CARE IF:   You have increased stomach or chest pain.   You vomit blood.   You faint or feel lightheaded.   You cannot keep fluids down.   You pass bloody or black stools.   You develop severe back pain.  MAKE SURE YOU:   Understand these instructions.   Will watch your condition.   Will get help right away if you are not doing well or get worse.  Document Released: 03/17/2005 Document Revised: 03/06/2011 Document Reviewed: 09/02/2006 [facility] Patient Information 2012 [place], [place].    Narcotic and benzodiazepine use may cause drowsiness, slowed breathing or dependence.  Please use with caution and do not drive, operate machinery or watch young children alone while taking them.  Taking combinations of these medications or drinking alcohol  will potentiate these effects.

## 2011-10-28 NOTE — ED Provider Notes (Signed)
History     CSN: [id number]  Arrival date & time 10/28/11  1229   First MD Initiated Contact with Patient 10/28/11 1502      Chief Complaint  Patient presents with  . Abdominal Pain  . Nausea  . Emesis  . Diarrhea    (Consider location/radiation/quality/duration/timing/severity/associated sxs/prior treatment) HPI Comments:  Patient presents to to worsening of mid abdominal pain without significant radiation of pain associated with nausea and vomiting x2 today. Patient has a history of type 1 diabetes for which he takes insulin. She also takes medication for blood pressure. Apparently she and her family member both contracted food poisoning several weeks ago. She required admission, likely in my opinion due to her chronic medical problems of diabetes and hypertension. Her family member fully recovered, and she mostly recovered as far as vomiting and diarrhea symptoms, however her abdominal pain mildly persisted. The patient has been seen in the emergency department several times due to  similar symptoms, most recently about one week ago. During that visit she had a CT scan and she was prescribed Prilosec, Reglan and Norco. The patient reports she ran out of Norco yesterday and this morning she has increase in her abdominal pain. She attributes the vomiting do to the severity of the episodes of pain. She is passing gas, but denies any constipation or diarrhea. She denies any chest pain, shortness of breath, sweats. She denies fever or chills. She denies any recent foreign travel. She was referred to Dr. Loreta Ave, gastroenterologist, but has not yet made her appointment. There was a voice message on her phone from Dr. Kenna Gilbert office today. However given her significant increase in pain, the patient decided to present to the emergency department rather than wait to try and make an outpatient appointment.  Patient is a 21 y.o. female presenting with abdominal pain, vomiting, and diarrhea. The history is  provided by the patient, medical records and a relative.  Abdominal Pain The primary symptoms of the illness include abdominal pain, fatigue, nausea and vomiting. The primary symptoms of the illness do not include fever, shortness of breath or dysuria.  Symptoms associated with the illness do not include chills, frequency or back pain.  Emesis  Associated symptoms include abdominal pain. Pertinent negatives include no chills and no fever.  Diarrhea The primary symptoms include fatigue, abdominal pain, nausea and vomiting. Primary symptoms do not include fever, dysuria or rash.  The illness does not include chills or back pain.    Past Medical History  Diagnosis Date  . Diabetes mellitus   . Hypertension     Past Surgical History  Procedure Date  . Ankle surgery     History reviewed. No pertinent family history.  History  Substance Use Topics  . Smoking status: Never Smoker   . Smokeless tobacco: Never Used  . Alcohol Use: Yes     Occasional    OB History    Grav Para Term Preterm Abortions TAB SAB Ect Mult Living                  Review of Systems  Constitutional: Positive for appetite change and fatigue. Negative for fever and chills.  Respiratory: Negative for shortness of breath.   Gastrointestinal: Positive for nausea, vomiting and abdominal pain.  Genitourinary: Negative for dysuria, frequency and flank pain.  Musculoskeletal: Negative for back pain.  Skin: Negative for rash.  All other systems reviewed and are negative.    Allergies  Review of patient's allergies indicates  no known allergies.  Home Medications   Current Outpatient Rx  Name Route Sig Dispense Refill  . ENALAPRIL MALEATE 10 MG PO TABS Oral Take 10 mg by mouth daily.    Marland Kitchen HYDROCODONE-ACETAMINOPHEN 5-325 MG PO TABS Oral Take 2 tablets by mouth every 4 (four) hours as needed for pain. 20 tablet 0  . INSULIN ASPART 100 UNIT/ML [place] SOLN Subcutaneous Inject 0-15 Units into the skin 3 (three)  times daily before meals. 5 u with each meal + sliding scale: CBG 70 - 120: 0 u: CBG 121 - 150: 2 u; CBG 151 - 200: 3 u; CBG 201 - 250: 5 u; CBG 251 - 300: 8 u;CBG 301 - 350: 11 u; CBG 351 - 400: 15 u; CBG > 400 Call MD    . INSULIN GLARGINE 100 UNIT/ML [place] SOLN Subcutaneous Inject 35 Units into the skin at bedtime.    Marland Kitchen METFORMIN HCL 500 MG PO TABS Oral Take 500 mg by mouth 2 (two) times daily with a meal.    . METOCLOPRAMIDE HCL 10 MG PO TABS Oral Take 1 tablet (10 mg total) by mouth every 6 (six) hours. 30 tablet 0  . ADULT MULTIVITAMIN W/MINERALS CH Oral Take 1 tablet by mouth daily.    Marland Kitchen OMEPRAZOLE 20 MG PO CPDR Oral Take 1 capsule (20 mg total) by mouth daily. 30 capsule 0  . PROMETHAZINE HCL 25 MG PO TABS Oral Take 25 mg by mouth every 6 (six) hours as needed. nausea    . SUCRALFATE 1 G PO TABS Oral Take 1 tablet (1 g total) by mouth 4 (four) times daily. 40 tablet 0  . HYDROCODONE-ACETAMINOPHEN 5-325 MG PO TABS  1-2 tablets po q 6 hours prn moderate to severe pain 12 tablet 0  . PROMETHAZINE HCL 25 MG PO TABS Oral Take 1 tablet (25 mg total) by mouth every 6 (six) hours as needed for nausea. 30 tablet 0    BP 147/94  Pulse 112  Temp 98.4 F (36.9 C) (Oral)  Resp 13  Ht 5\' 6"  (1.676 m)  Wt 123 lb (55.792 kg)  BMI 19.85 kg/m2  SpO2 100%  LMP 10/15/2011  Physical Exam  Nursing note and vitals reviewed. Constitutional: She is oriented to person, place, and time. She appears well-developed and well-nourished. No distress.  HENT:  Head: Normocephalic and atraumatic.  Eyes: Pupils are equal, round, and reactive to light. No scleral icterus.  Neck: Normal range of motion. Neck supple.  Cardiovascular: Regular rhythm.   Pulmonary/Chest: Effort normal. No respiratory distress. She has no wheezes.  Abdominal: Soft. She exhibits no distension. There is tenderness. There is no rebound.  Neurological: She is alert and oriented to person, place, and time.  Skin: Skin is warm.    ED  Course  Procedures (including critical care time)  Labs Reviewed  CBC WITH DIFFERENTIAL - Abnormal; Notable for the following:    WBC 3.2 (*)     MCHC 36.8 (*)     RDW 11.4 (*)     Neutro Abs 1.5 (*)     All other components within normal limits  BASIC METABOLIC PANEL - Abnormal; Notable for the following:    Glucose, Bld 246 (*)     Creatinine, Ser 0.44 (*)     All other components within normal limits  GLUCOSE, CAPILLARY - Abnormal; Notable for the following:    Glucose-Capillary 219 (*)     All other components within normal limits  GLUCOSE, CAPILLARY -  Abnormal; Notable for the following:    Glucose-Capillary 177 (*)     All other components within normal limits   No results found.   1. Gastritis   2. Nausea and vomiting     RA sat is 100% and I interpret to be normal.  4:17 PM I reviewed UA and U preg from 6 days ago and were negative.  I don't think they need to be repeated today.   5:12 PM Pt feels much improved, tolerated PO liquids without vomiting.  Abd soft, no guard, will speak to Dr. Loreta Ave and pt is stable for discharge.  Will give a short supply of norco here.     5:32 PM I spoke to Dr. Elnoria Howard who reports pt is in their system and that she will be seen by Dr. Loreta Ave soon.  MDM  I reviewed prior records and CT scan from last week.  I suspect pt may have some degree of gastroparesis.  Narcotic dependence is a possibility.  My plan is to treat pain, nausea, her electrolytes do not suggest DKA or sig dehydration.  Will try to help her obtain follow up with Dr. Loreta Ave.          Gavin Pound. Brolin Dambrosia, MD 10/28/11 [id number]

## 2011-10-28 NOTE — ED Notes (Signed)
Pt Rx reglan, prilosec and norco at last visit. Sts all meds helped, but ran out of the norco.

## 2011-10-28 NOTE — ED Notes (Signed)
Urine collected and sent down to lab for keeping. No order for testing at this time. RN notified 

## 2011-11-02 ENCOUNTER — Emergency Department (HOSPITAL_COMMUNITY)
Admission: EM | Admit: 2011-11-02 | Discharge: 2011-11-02 | Disposition: A | Discharge status: Home / Self Care | Payer: 59 | Attending: Emergency Medicine | Admitting: Emergency Medicine

## 2011-11-02 ENCOUNTER — Encounter (HOSPITAL_COMMUNITY): Payer: Self-pay | Admitting: Emergency Medicine

## 2011-11-02 DIAGNOSIS — E119 Type 2 diabetes mellitus without complications: Secondary | ICD-10-CM | POA: Insufficient documentation

## 2011-11-02 DIAGNOSIS — Z794 Long term (current) use of insulin: Secondary | ICD-10-CM | POA: Insufficient documentation

## 2011-11-02 DIAGNOSIS — K3184 Gastroparesis: Secondary | ICD-10-CM | POA: Insufficient documentation

## 2011-11-02 DIAGNOSIS — I10 Essential (primary) hypertension: Secondary | ICD-10-CM | POA: Insufficient documentation

## 2011-11-02 LAB — CBC WITH DIFFERENTIAL/PLATELET
Basophils Absolute: 0 10*3/uL (ref 0.0–0.1)
Basophils Relative: 1 % (ref 0–1)
Eosinophils Absolute: 0 10*3/uL (ref 0.0–0.7)
Eosinophils Relative: 1 % (ref 0–5)
HCT: 38 % (ref 36.0–46.0)
Hemoglobin: 14 g/dL (ref 12.0–15.0)
Lymphocytes Relative: 53 % — ABNORMAL HIGH (ref 12–46)
Lymphs Abs: 1.8 10*3/uL (ref 0.7–4.0)
MCH: 31 pg (ref 26.0–34.0)
MCHC: 36.8 g/dL — ABNORMAL HIGH (ref 30.0–36.0)
MCV: 84.3 fL (ref 78.0–100.0)
Monocytes Absolute: 0.4 10*3/uL (ref 0.1–1.0)
Monocytes Relative: 11 % (ref 3–12)
Neutro Abs: 1.2 10*3/uL — ABNORMAL LOW (ref 1.7–7.7)
Neutrophils Relative %: 35 % — ABNORMAL LOW (ref 43–77)
Platelets: 202 10*3/uL (ref 150–400)
RBC: 4.51 MIL/uL (ref 3.87–5.11)
RDW: 11.4 % — ABNORMAL LOW (ref 11.5–15.5)
WBC: 3.4 10*3/uL — ABNORMAL LOW (ref 4.0–10.5)

## 2011-11-02 LAB — POCT I-STAT, CHEM 8
BUN: 4 mg/dL — ABNORMAL LOW (ref 6–23)
Calcium, Ion: 1.23 mmol/L (ref 1.12–1.23)
Chloride: 98 mEq/L (ref 96–112)
Creatinine, Ser: 0.6 mg/dL (ref 0.50–1.10)
Glucose, Bld: 301 mg/dL — ABNORMAL HIGH (ref 70–99)
HCT: 40 % (ref 36.0–46.0)
Hemoglobin: 13.6 g/dL (ref 12.0–15.0)
Potassium: 4.1 mEq/L (ref 3.5–5.1)
Sodium: 135 mEq/L (ref 135–145)
TCO2: 25 mmol/L (ref 0–100)

## 2011-11-02 LAB — URINALYSIS, ROUTINE W REFLEX MICROSCOPIC
Bilirubin Urine: NEGATIVE
Glucose, UA: >1000 mg/dL — AB
Hgb urine dipstick: NEGATIVE
Ketones, ur: 40 mg/dL — AB
Leukocytes, UA: NEGATIVE
Nitrite: NEGATIVE
Protein, ur: NEGATIVE mg/dL
Specific Gravity, Urine: 1.017 (ref 1.005–1.030)
Urobilinogen, UA: 0.2 mg/dL (ref 0.0–1.0)
pH: 7 (ref 5.0–8.0)

## 2011-11-02 LAB — URINE MICROSCOPIC-ADD ON

## 2011-11-02 LAB — POCT PREGNANCY, URINE: Preg Test, Ur: NEGATIVE

## 2011-11-02 MED ORDER — FENTANYL CITRATE 0.05 MG/ML IJ SOLN
50.0000 ug | Freq: Once | INTRAMUSCULAR | Status: AC
  Administered 2011-11-02: 50 ug via INTRAVENOUS
  Filled 2011-11-02: qty 2

## 2011-11-02 MED ORDER — SODIUM CHLORIDE 0.9 % IV BOLUS (SEPSIS)
1000.0000 mL | Freq: Once | INTRAVENOUS | Status: AC
  Administered 2011-11-02: 1000 mL via INTRAVENOUS

## 2011-11-02 MED ORDER — HYDROCODONE-ACETAMINOPHEN 5-325 MG PO TABS: 1.0000 | ORAL_TABLET | ORAL | Status: DC | PRN

## 2011-11-02 MED ORDER — METOCLOPRAMIDE HCL 5 MG/ML IJ SOLN
10.0000 mg | Freq: Once | INTRAMUSCULAR | Status: AC
  Administered 2011-11-02: 10 mg via INTRAVENOUS
  Filled 2011-11-02: qty 2

## 2011-11-02 MED ORDER — METOCLOPRAMIDE HCL 10 MG PO TABS: 10.0000 mg | ORAL_TABLET | Freq: Four times a day (QID) | ORAL | Status: DC

## 2011-11-02 MED ORDER — PANTOPRAZOLE SODIUM 40 MG IV SOLR
40.0000 mg | Freq: Once | INTRAVENOUS | Status: AC
  Administered 2011-11-02: 40 mg via INTRAVENOUS
  Filled 2011-11-02: qty 40

## 2011-11-02 NOTE — ED Provider Notes (Signed)
History     CSN: [id number]  Arrival date & time 11/02/11  0430   First MD Initiated Contact with Patient 11/02/11 [phone]      Chief Complaint  Patient presents with  . Abdominal Pain    (Consider location/radiation/quality/duration/timing/severity/associated sxs/prior treatment) HPI Comments: This patient is an insulin-dependent diabetic since age 21.  She has recently been diagnosed with gastroparesis, for which she normally takes Reglan, and Vicodin.  She took the last of these tablets at midnight last night.  She was awoken at 3 AM with a recurrence of her pain and nausea.  She also reports that several days ago.  She was constipated, and she took a laxative and, now she's been having loose stools.  Her blood sugars have been running anywhere between 102 100 the past several months. She denies any recent illnesses, such as URI, UTI, vaginal discharge, nausea, vomiting, diarrhea, fevers, cough, sore throat  Patient is a 21 y.o. female presenting with abdominal pain. The history is provided by the patient.  Abdominal Pain The primary symptoms of the illness include abdominal pain and nausea. The primary symptoms of the illness do not include fever, vomiting or dysuria. The current episode started 6 to 12 hours ago. The onset of the illness was gradual.  Symptoms associated with the illness do not include chills.    Past Medical History  Diagnosis Date  . Diabetes mellitus   . Hypertension     Past Surgical History  Procedure Date  . Ankle surgery     No family history on file.  History  Substance Use Topics  . Smoking status: Never Smoker   . Smokeless tobacco: Never Used  . Alcohol Use: Yes     Occasional    OB History    Grav Para Term Preterm Abortions TAB SAB Ect Mult Living                  Review of Systems  Constitutional: Negative for fever and chills.  HENT: Negative for sore throat and rhinorrhea.   Gastrointestinal: Positive for nausea and abdominal  pain. Negative for vomiting and rectal pain.  Genitourinary: Negative for dysuria.  Neurological: Negative for dizziness, weakness and headaches.    Allergies  Review of patient's allergies indicates no known allergies.  Home Medications   Current Outpatient Rx  Name Route Sig Dispense Refill  . ENALAPRIL MALEATE 10 MG PO TABS Oral Take 10 mg by mouth daily.    . INSULIN ASPART 100 UNIT/ML [place] SOLN Subcutaneous Inject 0-15 Units into the skin 3 (three) times daily before meals. 5 u with each meal + sliding scale: CBG 70 - 120: 0 u: CBG 121 - 150: 2 u; CBG 151 - 200: 3 u; CBG 201 - 250: 5 u; CBG 251 - 300: 8 u;CBG 301 - 350: 11 u; CBG 351 - 400: 15 u; CBG > 400 Call MD    . INSULIN GLARGINE 100 UNIT/ML [place] SOLN Subcutaneous Inject 35 Units into the skin at bedtime.    Marland Kitchen METFORMIN HCL 500 MG PO TABS Oral Take 500 mg by mouth 2 (two) times daily with a meal.    . ADULT MULTIVITAMIN W/MINERALS CH Oral Take 1 tablet by mouth daily.    Marland Kitchen OMEPRAZOLE 20 MG PO CPDR Oral Take 1 capsule (20 mg total) by mouth daily. 30 capsule 0  . PROMETHAZINE HCL 25 MG PO TABS Oral Take 25 mg by mouth every 6 (six) hours as needed. nausea    .  SUCRALFATE 1 G PO TABS Oral Take 1 tablet (1 g total) by mouth 4 (four) times daily. 40 tablet 0  . HYDROCODONE-ACETAMINOPHEN 5-325 MG PO TABS  1-2 tablets po q 6 hours prn moderate to severe pain 12 tablet 0  . HYDROCODONE-ACETAMINOPHEN 5-325 MG PO TABS Oral Take 1 tablet by mouth every 4 (four) hours as needed for pain. 15 tablet 0  . METOCLOPRAMIDE HCL 10 MG PO TABS Oral Take 1 tablet (10 mg total) by mouth every 6 (six) hours. 30 tablet 0  . PROMETHAZINE HCL 25 MG PO TABS Oral Take 1 tablet (25 mg total) by mouth every 6 (six) hours as needed for nausea. 30 tablet 0    BP 141/96  Pulse 107  Temp 98.3 F (36.8 C) (Oral)  Resp 18  Ht 5\' 6"  (1.676 m)  Wt 125 lb (56.7 kg)  BMI 20.18 kg/m2  SpO2 98%  LMP 10/15/2011  Physical Exam  Constitutional: She is oriented to  person, place, and time. She appears well-developed.  Eyes: Pupils are equal, round, and reactive to light.  Neck: Normal range of motion.  Cardiovascular: Tachycardia present.   Pulmonary/Chest: Effort normal.  Abdominal: Soft. She exhibits no distension.  Musculoskeletal: Normal range of motion.  Neurological: She is alert and oriented to person, place, and time.  Skin: Skin is warm and dry. There is pallor.    ED Course  Procedures (including critical care time)  Labs Reviewed  CBC WITH DIFFERENTIAL - Abnormal; Notable for the following:    WBC 3.4 (*)     MCHC 36.8 (*)  PRE-WARMING TECHNIQUE USED   RDW 11.4 (*)     Neutrophils Relative 35 (*)     Neutro Abs 1.2 (*)     Lymphocytes Relative 53 (*)     All other components within normal limits  URINALYSIS, ROUTINE W REFLEX MICROSCOPIC - Abnormal; Notable for the following:    APPearance CLOUDY (*)     Glucose, UA >1000 (*)     Ketones, ur 40 (*)     All other components within normal limits  POCT I-STAT, CHEM 8 - Abnormal; Notable for the following:    BUN 4 (*)     Glucose, Bld 301 (*)     All other components within normal limits  URINE MICROSCOPIC-ADD ON - Abnormal; Notable for the following:    Bacteria, UA FEW (*)     Casts HYALINE CASTS (*)     All other components within normal limits  POCT PREGNANCY, URINE  LAB REPORT - SCANNED   No results found.   1. Gastroparesis       MDM   Will evaluate labs, IV hydrate with IV, Reglan, Protonix, and reevaluate        Arman Filter, NP 11/05/11 0150  Arman Filter, NP 11/05/11 0151

## 2011-11-02 NOTE — ED Notes (Signed)
Pt c/o epigastric pain onset 2200 tonight, +diarrhea

## 2011-11-02 NOTE — ED Notes (Signed)
PO challenge started

## 2011-11-05 NOTE — ED Provider Notes (Signed)
Medical screening examination/treatment/procedure(s) were performed by non-physician practitioner and as supervising physician I was immediately available for consultation/collaboration.    Johnna Acosta, MD 11/05/11 [phone]

## 2011-11-06 ENCOUNTER — Other Ambulatory Visit: Payer: Self-pay

## 2011-11-06 ENCOUNTER — Ambulatory Visit (HOSPITAL_COMMUNITY)
Admission: RE | Admit: 2011-11-06 | Discharge: 2011-11-06 | Disposition: A | Discharge status: Home / Self Care | Payer: 59 | Source: Ambulatory Visit | Attending: Gastroenterology | Admitting: Gastroenterology

## 2011-11-06 ENCOUNTER — Other Ambulatory Visit: Payer: Self-pay | Admitting: Gastroenterology

## 2011-11-06 ENCOUNTER — Encounter (HOSPITAL_COMMUNITY): Payer: Self-pay

## 2011-11-06 ENCOUNTER — Emergency Department (HOSPITAL_COMMUNITY)
Admission: EM | Admit: 2011-11-06 | Discharge: 2011-11-07 | Disposition: A | Discharge status: Home / Self Care | Payer: 59 | Attending: Emergency Medicine | Admitting: Emergency Medicine

## 2011-11-06 DIAGNOSIS — R109 Unspecified abdominal pain: Secondary | ICD-10-CM

## 2011-11-06 DIAGNOSIS — Z79899 Other long term (current) drug therapy: Secondary | ICD-10-CM | POA: Insufficient documentation

## 2011-11-06 DIAGNOSIS — E860 Dehydration: Secondary | ICD-10-CM | POA: Insufficient documentation

## 2011-11-06 DIAGNOSIS — E119 Type 2 diabetes mellitus without complications: Secondary | ICD-10-CM | POA: Insufficient documentation

## 2011-11-06 DIAGNOSIS — I10 Essential (primary) hypertension: Secondary | ICD-10-CM | POA: Insufficient documentation

## 2011-11-06 DIAGNOSIS — R110 Nausea: Secondary | ICD-10-CM

## 2011-11-06 DIAGNOSIS — Z794 Long term (current) use of insulin: Secondary | ICD-10-CM | POA: Insufficient documentation

## 2011-11-06 DIAGNOSIS — R1013 Epigastric pain: Secondary | ICD-10-CM | POA: Insufficient documentation

## 2011-11-06 LAB — URINE MICROSCOPIC-ADD ON

## 2011-11-06 LAB — GLUCOSE, CAPILLARY: Glucose-Capillary: 224 mg/dL — ABNORMAL HIGH (ref 70–99)

## 2011-11-06 LAB — COMPREHENSIVE METABOLIC PANEL
ALT: 14 U/L (ref 0–35)
AST: 15 U/L (ref 0–37)
Albumin: 4.8 g/dL (ref 3.5–5.2)
Alkaline Phosphatase: 66 U/L (ref 39–117)
BUN: 10 mg/dL (ref 6–23)
CO2: 26 mEq/L (ref 19–32)
Calcium: 10.1 mg/dL (ref 8.4–10.5)
Chloride: 91 mEq/L — ABNORMAL LOW (ref 96–112)
Creatinine, Ser: 0.46 mg/dL — ABNORMAL LOW (ref 0.50–1.10)
GFR calc Af Amer: >90 mL/min (ref >90)
GFR calc non Af Amer: >90 mL/min (ref >90)
Glucose, Bld: 248 mg/dL — ABNORMAL HIGH (ref 70–99)
Potassium: 3.9 mEq/L (ref 3.5–5.1)
Sodium: 133 mEq/L — ABNORMAL LOW (ref 135–145)
Total Bilirubin: 0.7 mg/dL (ref 0.3–1.2)
Total Protein: 8.5 g/dL — ABNORMAL HIGH (ref 6.0–8.3)

## 2011-11-06 LAB — CBC WITH DIFFERENTIAL/PLATELET
Basophils Absolute: 0 10*3/uL (ref 0.0–0.1)
Basophils Relative: 1 % (ref 0–1)
Eosinophils Absolute: 0 10*3/uL (ref 0.0–0.7)
Eosinophils Relative: 0 % (ref 0–5)
HCT: 42.5 % (ref 36.0–46.0)
Hemoglobin: 15.8 g/dL — ABNORMAL HIGH (ref 12.0–15.0)
Lymphocytes Relative: 43 % (ref 12–46)
Lymphs Abs: 1.7 10*3/uL (ref 0.7–4.0)
MCH: 31 pg (ref 26.0–34.0)
MCHC: 37.2 g/dL — ABNORMAL HIGH (ref 30.0–36.0)
MCV: 83.3 fL (ref 78.0–100.0)
Monocytes Absolute: 0.4 10*3/uL (ref 0.1–1.0)
Monocytes Relative: 9 % (ref 3–12)
Neutro Abs: 1.8 10*3/uL (ref 1.7–7.7)
Neutrophils Relative %: 47 % (ref 43–77)
Platelets: 260 10*3/uL (ref 150–400)
RBC: 5.1 MIL/uL (ref 3.87–5.11)
RDW: 11.4 % — ABNORMAL LOW (ref 11.5–15.5)
WBC: 3.9 10*3/uL — ABNORMAL LOW (ref 4.0–10.5)

## 2011-11-06 LAB — BASIC METABOLIC PANEL
BUN: 9 mg/dL (ref 6–23)
CO2: 23 mEq/L (ref 19–32)
Calcium: 8.9 mg/dL (ref 8.4–10.5)
Chloride: 95 mEq/L — ABNORMAL LOW (ref 96–112)
Creatinine, Ser: 0.43 mg/dL — ABNORMAL LOW (ref 0.50–1.10)
GFR calc Af Amer: >90 mL/min (ref >90)
GFR calc non Af Amer: >90 mL/min (ref >90)
Glucose, Bld: 220 mg/dL — ABNORMAL HIGH (ref 70–99)
Potassium: 4 mEq/L (ref 3.5–5.1)
Sodium: 132 mEq/L — ABNORMAL LOW (ref 135–145)

## 2011-11-06 LAB — URINALYSIS, ROUTINE W REFLEX MICROSCOPIC
Bilirubin Urine: NEGATIVE
Glucose, UA: 100 mg/dL — AB
Hgb urine dipstick: NEGATIVE
Ketones, ur: >80 mg/dL — AB
Leukocytes, UA: NEGATIVE
Nitrite: NEGATIVE
Protein, ur: 30 mg/dL — AB
Specific Gravity, Urine: 1.025 (ref 1.005–1.030)
Urobilinogen, UA: 0.2 mg/dL (ref 0.0–1.0)
pH: 5.5 (ref 5.0–8.0)

## 2011-11-06 LAB — LACTIC ACID, PLASMA: Lactic Acid, Venous: 1.2 mmol/L (ref 0.5–2.2)

## 2011-11-06 LAB — POCT PREGNANCY, URINE: Preg Test, Ur: NEGATIVE

## 2011-11-06 IMAGING — US US ABDOMEN COMPLETE
1 series · 14 of 25 positions shown · non-contrast
Comparison: None.

CLINICAL DATA: Hypertension and diabetes.  Abdominal pain.

ABDOMINAL ULTRASOUND COMPLETE

[Series 1: us abdomen complete · 0.18mm/px · 14 of 55 slices shown]
[im 1/55]
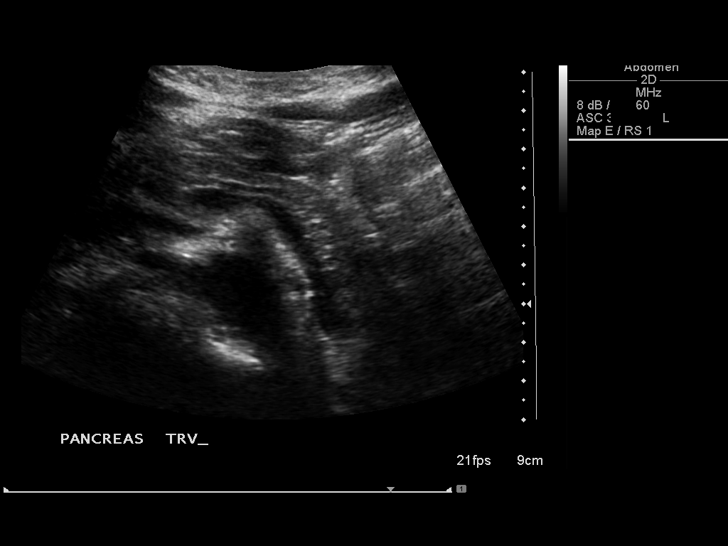
[im 5/55]
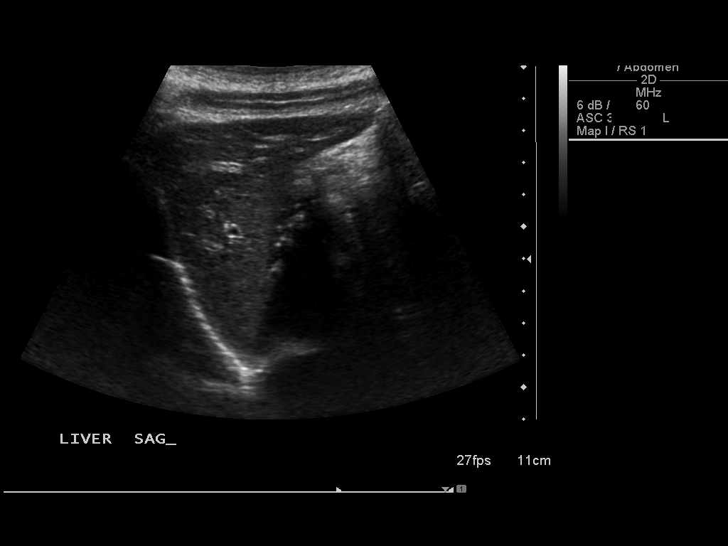
[im 10/55]
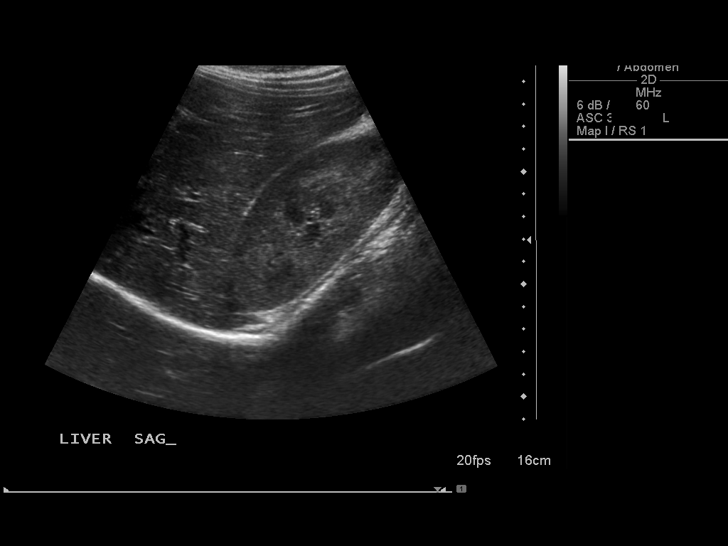
[im 14/55]
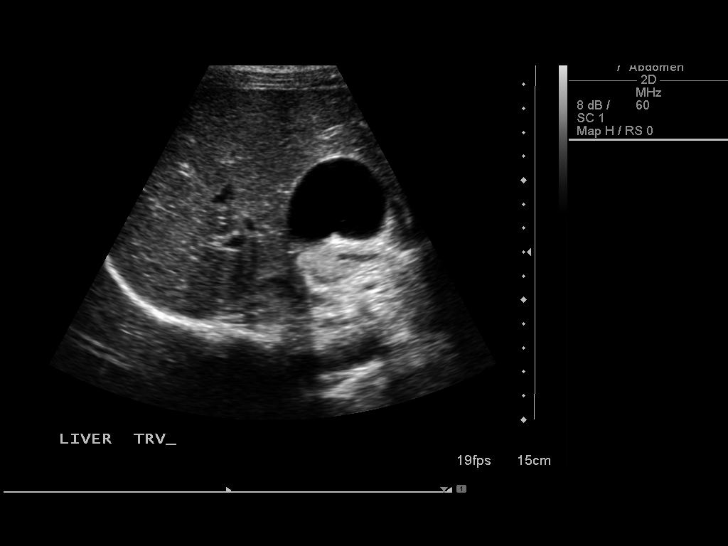
[im 19/55]
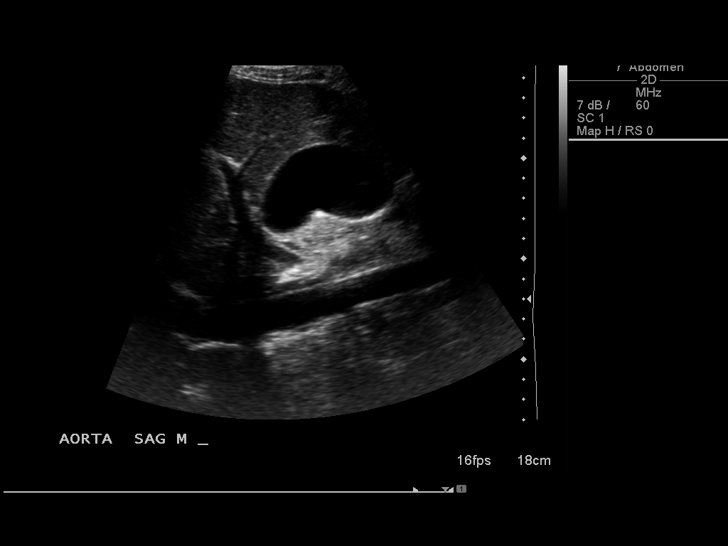
[im 21/55]
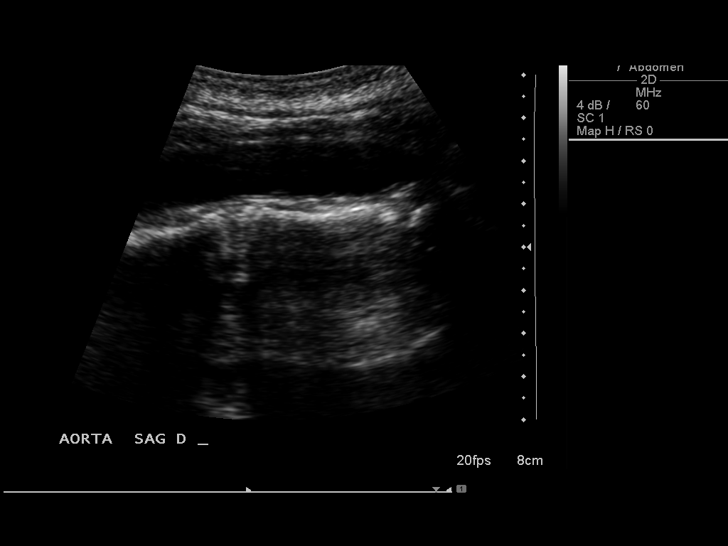
[im 25/55]
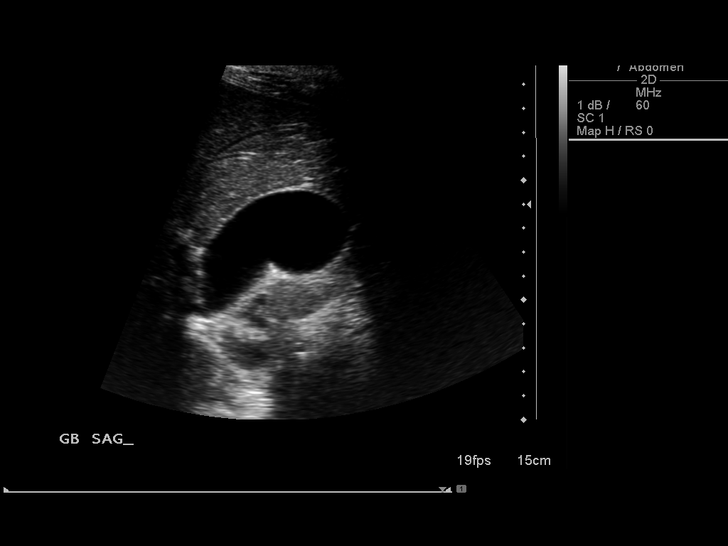
[im 30/55]
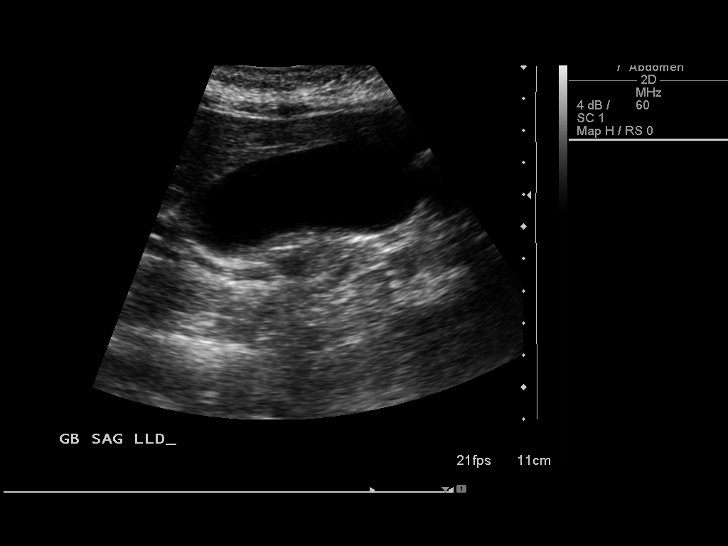
[im 34/55]
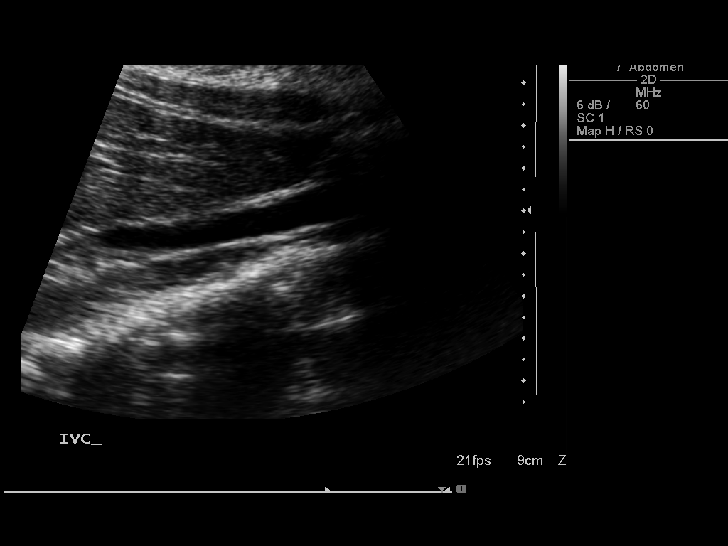
[im 37/55]
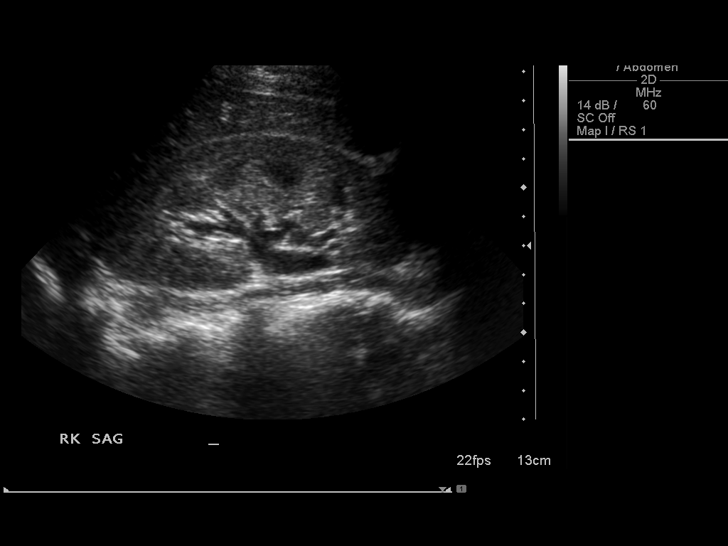
[im 41/55]
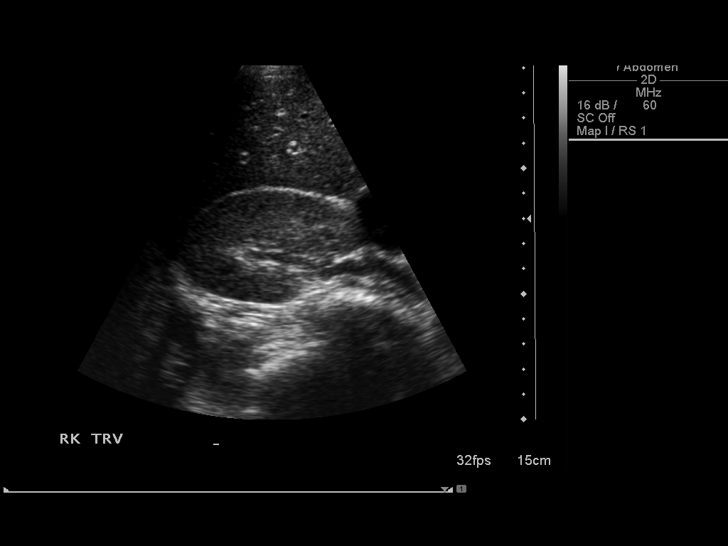
[im 46/55]
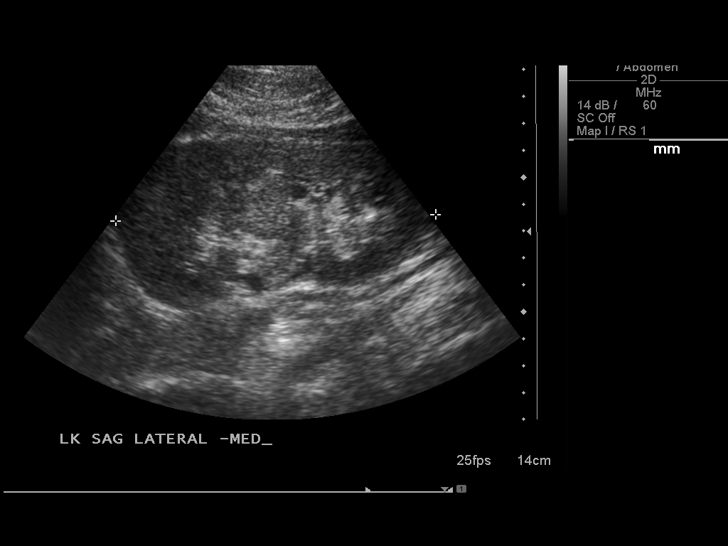
[im 50/55]
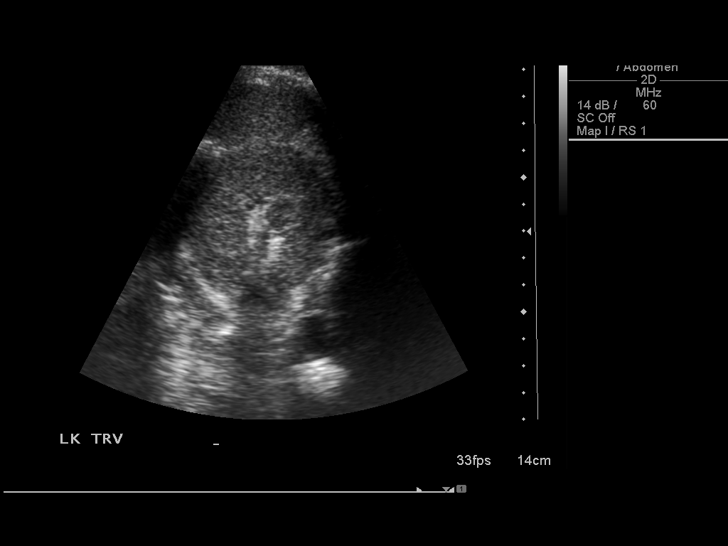
[im 55/55]
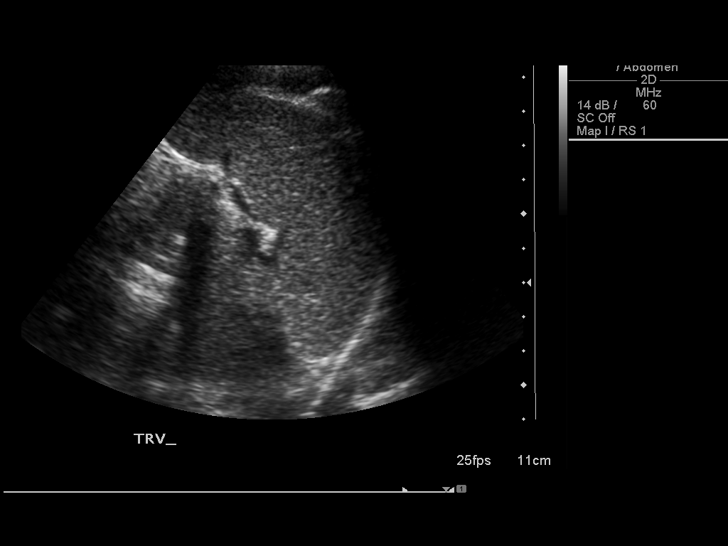

[14 of 25 positions shown; findings below may reference images not displayed]

FINDINGS: Gallbladder:  No gallstones, gallbladder wall thickening, or
pericholecystic fluid.  Negative sonographic Murphy's sign.

Common Bile Duct:  Within normal limits in caliber. Measures
mm..

Liver: No focal mass lesion identified.  Within normal limits in
parenchymal echogenicity.

IVC:  Appears normal.

Pancreas:  No abnormality identified.

Spleen:  Within normal limits in size and echotexture.

Right kidney:  Normal in size and parenchymal echogenicity.  No
evidence of mass or hydronephrosis.

Left kidney:  Normal in size and parenchymal echogenicity.  No
evidence of mass or hydronephrosis.

Abdominal Aorta:  No aneurysm identified.
IMPRESSION: Negative abdominal ultrasound.

## 2011-11-06 MED ORDER — SODIUM CHLORIDE 0.9 % IV BOLUS (SEPSIS)
1000.0000 mL | Freq: Once | INTRAVENOUS | Status: AC
  Administered 2011-11-06: 1000 mL via INTRAVENOUS

## 2011-11-06 MED ORDER — SODIUM CHLORIDE 0.9 % IV SOLN
Freq: Once | INTRAVENOUS | Status: AC
  Administered 2011-11-06: 23:00:00 via INTRAVENOUS

## 2011-11-06 MED ORDER — MORPHINE SULFATE 4 MG/ML IJ SOLN
4.0000 mg | Freq: Once | INTRAMUSCULAR | Status: AC
  Administered 2011-11-06: 4 mg via INTRAVENOUS
  Filled 2011-11-06: qty 1

## 2011-11-06 MED ORDER — HYDROMORPHONE HCL PF 1 MG/ML IJ SOLN
1.0000 mg | Freq: Once | INTRAMUSCULAR | Status: AC
  Administered 2011-11-06: 1 mg via INTRAVENOUS
  Filled 2011-11-06: qty 1

## 2011-11-06 NOTE — ED Notes (Signed)
RUE:[id number]<JW> Expected date:<BR> Expected time:<BR> Means of arrival:<BR> Comments:<BR> Hold for [place], [place]

## 2011-11-06 NOTE — ED Notes (Signed)
Seen at Dr. Kenna Gilbert office (GI dr. ) to be admitted- 6th ED visit in 4 weeks. C/o severe abd pain, throwing up but able to keep some fluids down. Has lost 25# in 4 weeks.

## 2011-11-07 ENCOUNTER — Emergency Department (HOSPITAL_COMMUNITY): Payer: 59

## 2011-11-07 LAB — OSMOLALITY: Osmolality: 291 mOsm/kg (ref 275–300)

## 2011-11-07 IMAGING — CT CT ANGIO CHEST
1 of 2 series · 19 of 32 positions shown · IV contrast ([ID] [id number])
Comparison: None.

CLINICAL DATA: Tachycardia.

CT ANGIOGRAPHY CHEST
TECHNIQUE: Multidetector CT imaging of the chest using the
standard protocol during bolus administration of intravenous
contrast. Multiplanar reconstructed images including MIPs were
obtained and reviewed to evaluate the vascular anatomy.
Contrast: 100mL OMNIPAQUE IOHEXOL 350 MG/ML SOLN

[Series 10: thins for pacs · axial · 0.59mm/px · z∈[+254,+450]mm · 19 of 219 slices shown]
[im 11/219  lung]
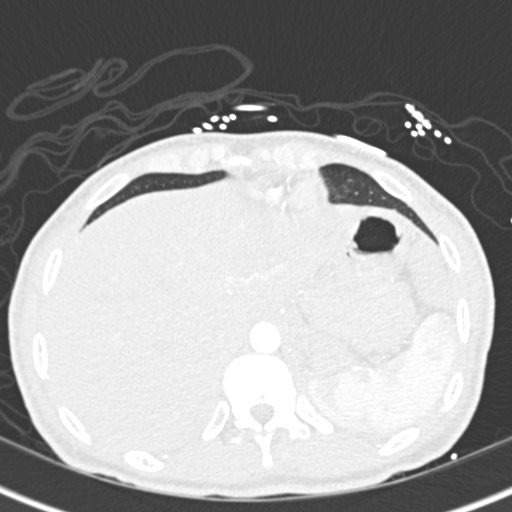
[im 22/219  mediastinal]
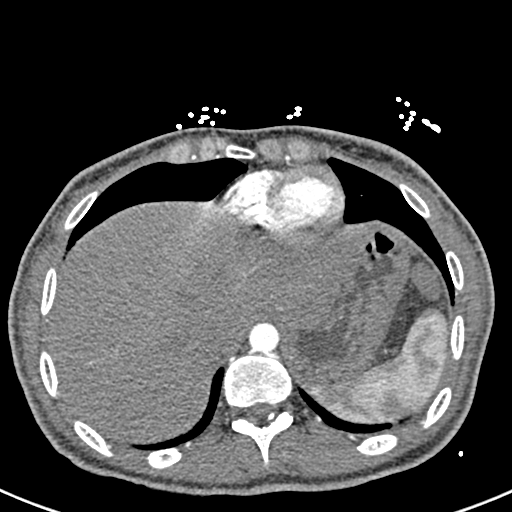
[im 33/219  lung]
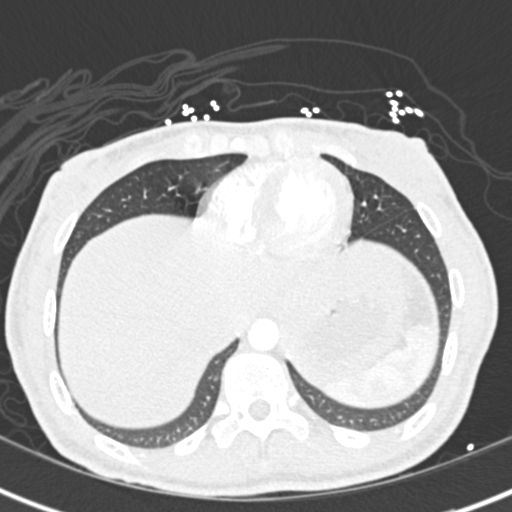
[im 55/219  mediastinal]
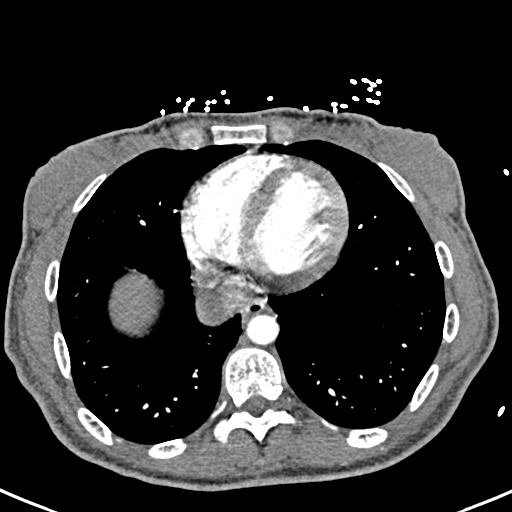
[im 66/219  lung]
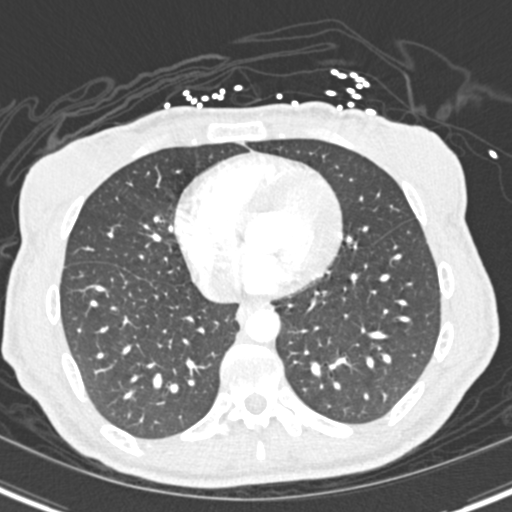
[im 73/219  mediastinal]
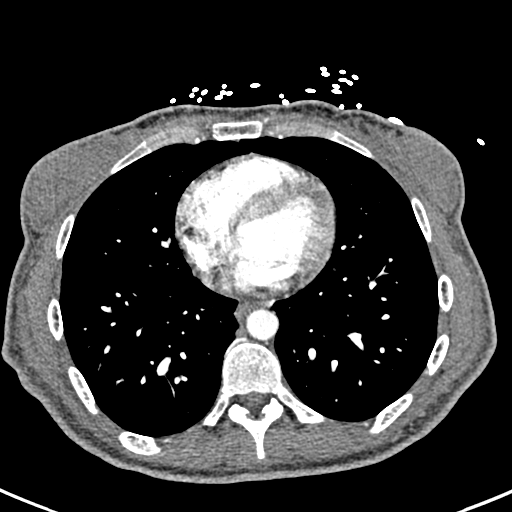
[im 77/219  lung]
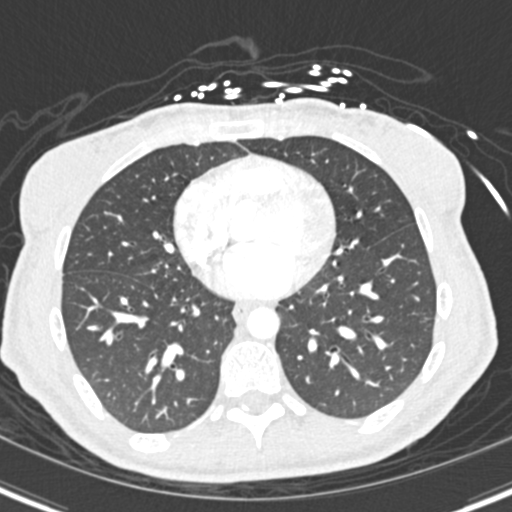
[im 88/219  mediastinal]
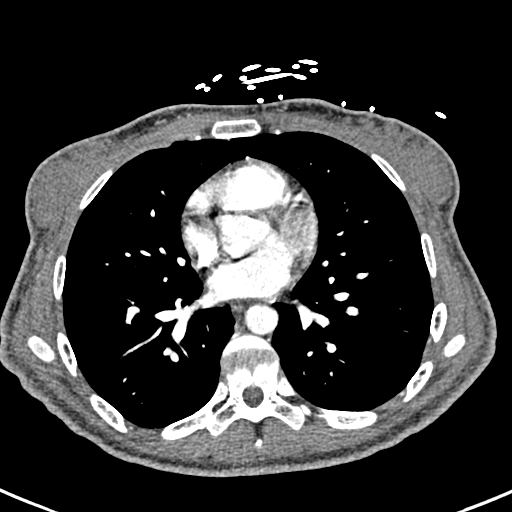
[im 99/219  lung]
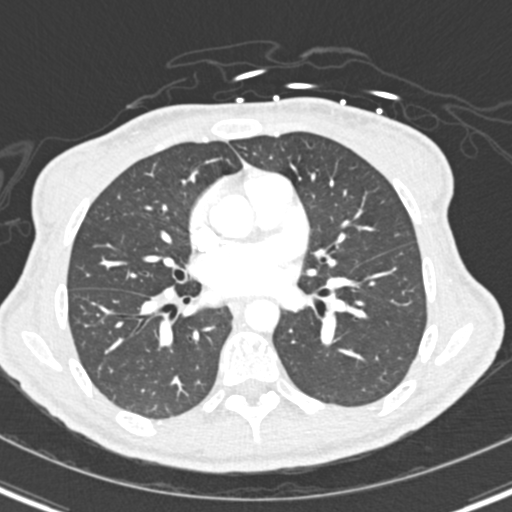
[im 110/219  mediastinal]
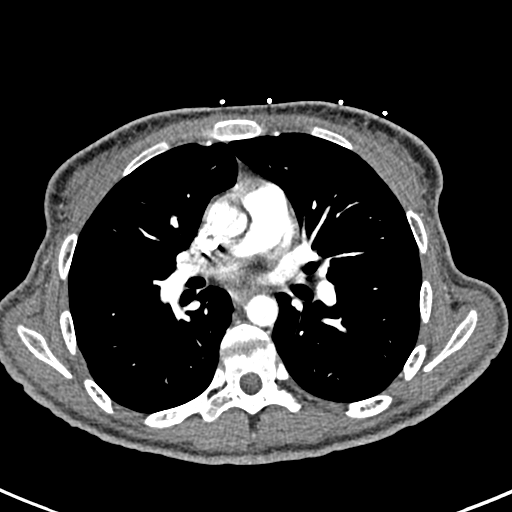
[im 120/219  lung]
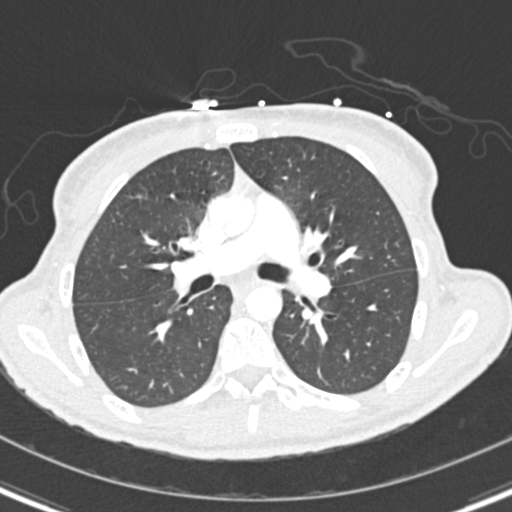
[im 131/219  mediastinal]
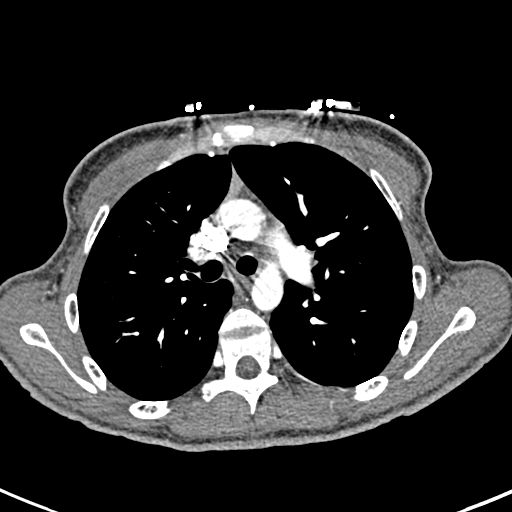
[im 142/219  lung]
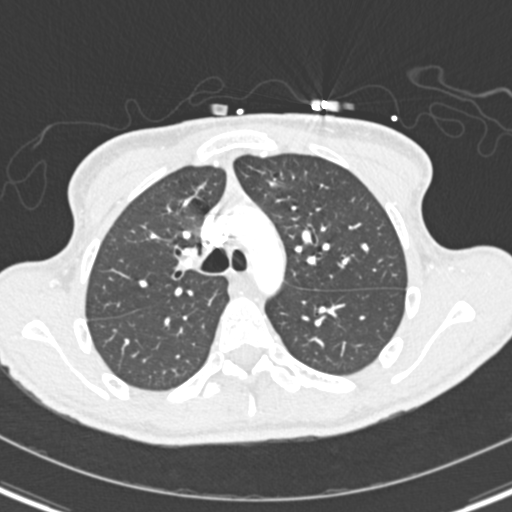
[im 146/219  mediastinal]
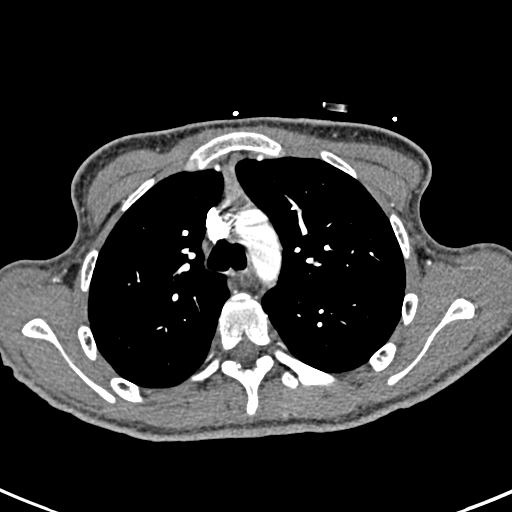
[im 153/219  lung]
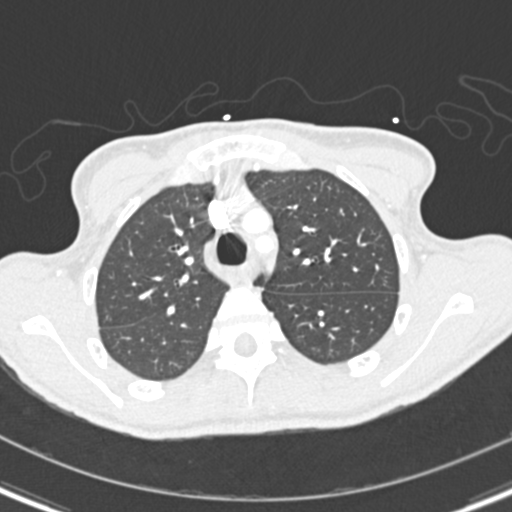
[im 164/219  mediastinal]
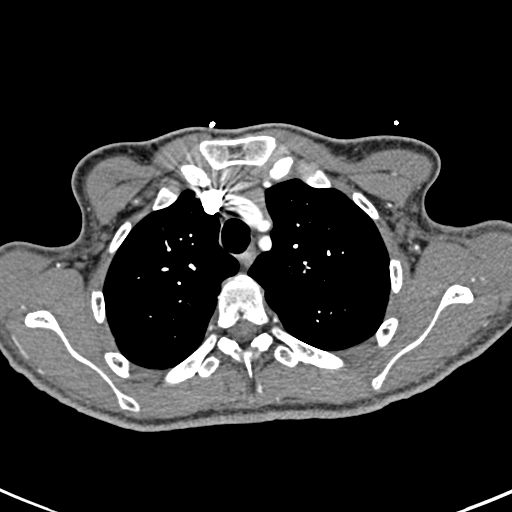
[im 186/219  lung]
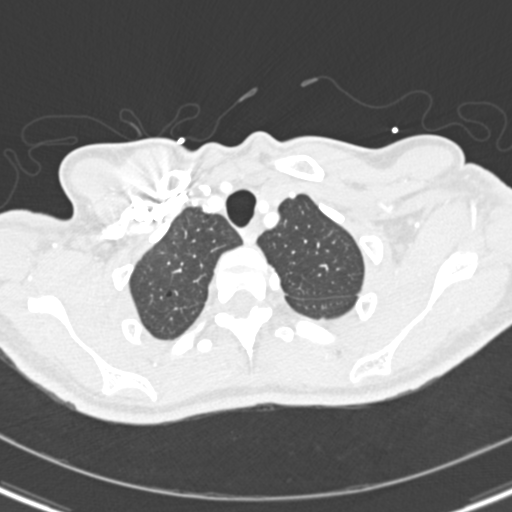
[im 197/219  mediastinal]
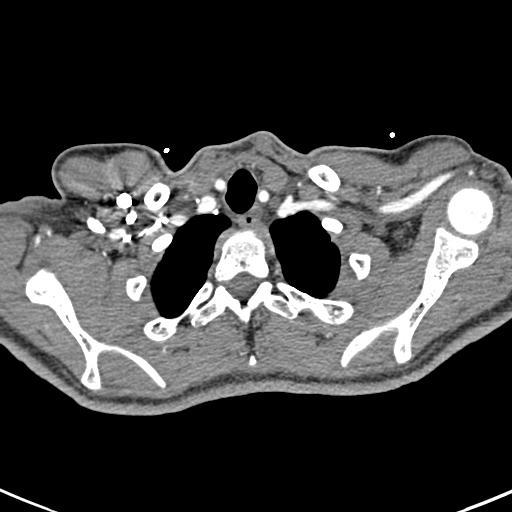
[im 208/219  lung]
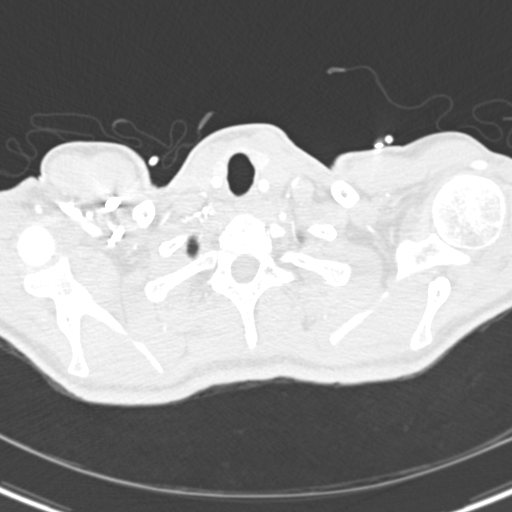

[19 of 32 positions shown; findings below may reference images not displayed]

FINDINGS: Technically adequate study with good opacification of the
central and segmental pulmonary arteries.  No focal filling defects
demonstrated.  No evidence of significant pulmonary embolus.

Normal heart size.  Normal caliber thoracic aorta.  No significant
lymphadenopathy in the chest.  The esophagus is decompressed.
Visualized portions of the upper abdominal organs are grossly
unremarkable.

No pleural effusions.  No focal airspace consolidation or
interstitial changes in the lungs.  No pneumothorax.  There is a
tiny bleb or cyst in the right apex.  Airways appear patent.
Normal alignment of the thoracic vertebrae.
IMPRESSION: No evidence of significant pulmonary embolus.

## 2011-11-07 MED ORDER — SODIUM CHLORIDE 0.9 % IV BOLUS (SEPSIS)
1000.0000 mL | Freq: Once | INTRAVENOUS | Status: AC
  Administered 2011-11-07: 1000 mL via INTRAVENOUS

## 2011-11-07 MED ORDER — ONDANSETRON HCL 4 MG PO TABS: 4.0000 mg | ORAL_TABLET | Freq: Four times a day (QID) | ORAL | Status: DC

## 2011-11-07 MED ORDER — IOHEXOL 350 MG/ML SOLN
100.0000 mL | Freq: Once | INTRAVENOUS | Status: AC | PRN
  Administered 2011-11-07: 100 mL via INTRAVENOUS

## 2011-11-07 MED ORDER — HYDROMORPHONE HCL PF 1 MG/ML IJ SOLN
1.0000 mg | Freq: Once | INTRAMUSCULAR | Status: AC
  Administered 2011-11-07: 1 mg via INTRAVENOUS
  Filled 2011-11-07: qty 1

## 2011-11-07 MED ORDER — HYDROCODONE-ACETAMINOPHEN 5-325 MG PO TABS: 1.0000 | ORAL_TABLET | Freq: Three times a day (TID) | ORAL | Status: DC | PRN

## 2011-11-07 NOTE — ED Provider Notes (Addendum)
History     CSN: [id number]  Arrival date & time 11/06/11  1755   First MD Initiated Contact with Patient 11/06/11 1831      Chief Complaint  Patient presents with  . Abdominal Pain    (Consider location/radiation/quality/duration/timing/severity/associated sxs/prior treatment) HPI Comments: Pt with hx of iddm comes in with cc of abd pain. Pt has had this epigastric abd pain for the past few weeks. The pain is located in the epigastrium, and in the RUQ. She has had 2 CT scan, [place] that are all negative for any acute process. The pain is redsponding to norco, but transientyl, and as soon as she runs out of he meds, her severe pain returns. Pt has associated nausea, anorexia, no emesis, no diarrhea. Pt has no radiation to the back. There is 20+ lbs weight loss in 1 month. No arthralgias and no hx of IBD, bloody stools.  Pt was seen by her pcp aand referred to GI doctor.   Patient is a 21 y.o. female presenting with abdominal pain. The history is provided by the patient and medical records.  Abdominal Pain The primary symptoms of the illness include abdominal pain and nausea. The primary symptoms of the illness do not include shortness of breath, vomiting, diarrhea or dysuria.  Symptoms associated with the illness do not include constipation.    Past Medical History  Diagnosis Date  . Diabetes mellitus   . Hypertension     Past Surgical History  Procedure Date  . Ankle surgery     History reviewed. No pertinent family history.  History  Substance Use Topics  . Smoking status: Never Smoker   . Smokeless tobacco: Never Used  . Alcohol Use: Yes     Occasional    OB History    Grav Para Term Preterm Abortions TAB SAB Ect Mult Living                  Review of Systems  Constitutional: Negative for activity change.  HENT: Negative for neck pain.   Respiratory: Negative for shortness of breath.   Cardiovascular: Negative for chest pain.  Gastrointestinal: Positive for  nausea and abdominal pain. Negative for vomiting, diarrhea and constipation.  Genitourinary: Negative for dysuria, flank pain and enuresis.  Neurological: Negative for headaches.    Allergies  Review of patient's allergies indicates no known allergies.  Home Medications   Current Outpatient Rx  Name Route Sig Dispense Refill  . ENALAPRIL MALEATE 10 MG PO TABS Oral Take 10 mg by mouth daily.    Marland Kitchen HYDROCODONE-ACETAMINOPHEN 5-325 MG PO TABS Oral Take 1 tablet by mouth every 6 (six) hours as needed. Pain    . INSULIN ASPART 100 UNIT/ML [place] SOLN Subcutaneous Inject 0-15 Units into the skin 3 (three) times daily before meals. 5 u with each meal + sliding scale: CBG 70 - 120: 0 u: CBG 121 - 150: 2 u; CBG 151 - 200: 3 u; CBG 201 - 250: 5 u; CBG 251 - 300: 8 u;CBG 301 - 350: 11 u; CBG 351 - 400: 15 u; CBG > 400 Call MD    . INSULIN GLARGINE 100 UNIT/ML [place] SOLN Subcutaneous Inject 30 Units into the skin at bedtime.     Marland Kitchen METFORMIN HCL 500 MG PO TABS Oral Take 500 mg by mouth 2 (two) times daily with a meal.    . METOCLOPRAMIDE HCL 10 MG PO TABS Oral Take 10 mg by mouth 4 (four) times daily.    Marland Kitchen  ADULT MULTIVITAMIN W/MINERALS CH Oral Take 1 tablet by mouth daily.    Marland Kitchen OMEPRAZOLE 20 MG PO CPDR Oral Take 20 mg by mouth daily.    Marland Kitchen PROMETHAZINE HCL 25 MG PO TABS Oral Take 25 mg by mouth every 6 (six) hours as needed. nausea    . SUCRALFATE 1 G PO TABS Oral Take 1 g by mouth 4 (four) times daily.      BP 140/108  Pulse 148  Temp 98.4 F (36.9 C) (Oral)  Resp 20  SpO2 100%  LMP 10/15/2011  Physical Exam  Constitutional: She is oriented to person, place, and time. She appears well-developed and well-nourished.  HENT:  Head: Normocephalic and atraumatic.  Eyes: EOM are normal. Pupils are equal, round, and reactive to light.  Neck: Neck supple.  Cardiovascular: Normal rate, regular rhythm and normal heart sounds.   No murmur heard. Pulmonary/Chest: Effort normal. No respiratory distress.    Abdominal: Soft. She exhibits no distension. There is tenderness. There is no rebound and no guarding.       Epigastric tenderness, no guarding, rebound.  Neurological: She is alert and oriented to person, place, and time.  Skin: Skin is warm and dry.    ED Course  Procedures (including critical care time)  Labs Reviewed  URINALYSIS, ROUTINE W REFLEX MICROSCOPIC - Abnormal; Notable for the following:    Color, Urine AMBER (*)  BIOCHEMICALS MAY BE AFFECTED BY COLOR   APPearance CLOUDY (*)     Glucose, UA 100 (*)     Ketones, ur >80 (*)     Protein, ur 30 (*)     All other components within normal limits  CBC WITH DIFFERENTIAL - Abnormal; Notable for the following:    WBC 3.9 (*)     Hemoglobin 15.8 (*)     MCHC 37.2 (*)     RDW 11.4 (*)     All other components within normal limits  COMPREHENSIVE METABOLIC PANEL - Abnormal; Notable for the following:    Sodium 133 (*)     Chloride 91 (*)     Glucose, Bld 248 (*)     Creatinine, Ser 0.46 (*)     Total Protein 8.5 (*)     All other components within normal limits  GLUCOSE, CAPILLARY - Abnormal; Notable for the following:    Glucose-Capillary 224 (*)     All other components within normal limits  URINE MICROSCOPIC-ADD ON - Abnormal; Notable for the following:    Squamous Epithelial / LPF MANY (*)     Casts HYALINE CASTS (*)  GRANULAR CAST   All other components within normal limits  BASIC METABOLIC PANEL - Abnormal; Notable for the following:    Sodium 132 (*)     Chloride 95 (*)     Glucose, Bld 220 (*)     Creatinine, Ser 0.43 (*)     All other components within normal limits  POCT PREGNANCY, URINE  LACTIC ACID, PLASMA  OSMOLALITY   US Abdomen Complete  11/06/2011  *RADIOLOGY REPORT*  Clinical Data:  Hypertension and diabetes.  Abdominal pain.  ABDOMINAL ULTRASOUND COMPLETE  Comparison:  None.  Findings:  Gallbladder:  No gallstones, gallbladder wall thickening, or pericholecystic fluid.  Negative sonographic Murphy's  sign.  Common Bile Duct:  Within normal limits in caliber. Measures 4.8 mm.  Liver: No focal mass lesion identified.  Within normal limits in parenchymal echogenicity.  IVC:  Appears normal.  Pancreas:  No abnormality identified.  Spleen:  Within normal limits in size and echotexture.  Right kidney:  Normal in size and parenchymal echogenicity.  No evidence of mass or hydronephrosis.  Left kidney:  Normal in size and parenchymal echogenicity.  No evidence of mass or hydronephrosis.  Abdominal Aorta:  No aneurysm identified.  IMPRESSION: Negative abdominal ultrasound.  Original Report Authenticated By: Duayne Cal, M.D.     No diagnosis found.    MDM   Date: 11/07/2011  Rate: 148  Rhythm: sinus tachycardia  QRS Axis: normal  Intervals: normal  ST/T Wave abnormalities: normal  Conduction Disutrbances:none  Narrative Interpretation:   Old EKG Reviewed: none available   Pt comes in with cc of abd pain. Pt has had extensive workup done - and so far the results are negative for any acute pathology. It also appears that she has been appropriately following up with her outpatient doctors and also has GI appt.  Based on the information we have, i dont think patient requires any further GI imaging. We will give GI cocktail and get basic labs and try to control the pain. Pt is tachycardic, and appears slightly dry - we will give patient fluids. No chest pain, sob - no risk factors for DVT/PE. We will r/o DKA. If tachycardia doesnt resolve, will consider CT PE.  12:11 AM Post 2+ liters, patient is still tachycardic. We will get CT PE. Spoke with the hospitalist and the patient. Patient has had 6 visits to the ED within the last 1 month. Family is frustrated with the pace of care. That being said, it appears that she has appropriate follow up and care plan. Pt doesn't need to be admitted for any specific medical condition at this time. She is not in DKA, there is no acute abd process - and so we  will r/o PE and consider d/c vs. obs admission.   Varney Biles, MD 11/07/11 [id number]

## 2011-11-07 NOTE — ED Notes (Signed)
Attempted to start another IV access for pt to go to CT. Pt complained that new access area in L arm hurt so IV was removed. Will reattempt access for test.

## 2011-11-07 NOTE — Discharge Instructions (Signed)
We saw you in the ER for the abdominal pain. All of our results are normal, including all labs and imaging. Kidney function is fine as well. We are not sure what is causing your abdominal pain, and recommend that you see your primary care doctor within 2-3 days for further evaluation. Take the pain meds and nausea meds as prescribed.  Dehydration, Adult Dehydration is when you lose more fluids from the body than you take in. Vital organs like the kidneys, brain, and heart cannot function without a proper amount of fluids and salt. Any loss of fluids from the body can cause dehydration.  CAUSES   Vomiting.   Diarrhea.   Excessive sweating.   Excessive urine output.   Fever.  SYMPTOMS  Mild dehydration  Thirst.   Dry lips.   Slightly dry mouth.  Moderate dehydration  Very dry mouth.   Sunken eyes.   Skin does not bounce back quickly when lightly pinched and released.   Dark urine and decreased urine production.   Decreased tear production.   Headache.  Severe dehydration  Very dry mouth.   Extreme thirst.   Rapid, weak pulse (more than 100 beats per minute at rest).   Cold hands and feet.   Not able to sweat in spite of heat and temperature.   Rapid breathing.   Blue lips.   Confusion and lethargy.   Difficulty being awakened.   Minimal urine production.   No tears.  DIAGNOSIS  Your caregiver will diagnose dehydration based on your symptoms and your exam. Blood and urine tests will help confirm the diagnosis. The diagnostic evaluation should also identify the cause of dehydration. TREATMENT  Treatment of mild or moderate dehydration can often be done at home by increasing the amount of fluids that you drink. It is best to drink small amounts of fluid more often. Drinking too much at one time can make vomiting worse. Refer to the home care instructions below. Severe dehydration needs to be treated at the hospital where you will probably be given  intravenous (IV) fluids that contain water and electrolytes. HOME CARE INSTRUCTIONS   Ask your caregiver about specific rehydration instructions.   Drink enough fluids to keep your urine clear or pale yellow.   Drink small amounts frequently if you have nausea and vomiting.   Eat as you normally do.   Avoid:   Foods or drinks high in sugar.   Carbonated drinks.   Juice.   Extremely hot or cold fluids.   Drinks with caffeine.   Fatty, greasy foods.   Alcohol.   Tobacco.   Overeating.   Gelatin desserts.   Wash your hands well to avoid spreading bacteria and viruses.   Only take over-the-counter or prescription medicines for pain, discomfort, or fever as directed by your caregiver.   Ask your caregiver if you should continue all prescribed and over-the-counter medicines.   Keep all follow-up appointments with your caregiver.  SEEK MEDICAL CARE IF:  You have abdominal pain and it increases or stays in one area (localizes).   You have a rash, stiff neck, or severe headache.   You are irritable, sleepy, or difficult to awaken.   You are weak, dizzy, or extremely thirsty.  SEEK IMMEDIATE MEDICAL CARE IF:   You are unable to keep fluids down or you get worse despite treatment.   You have frequent episodes of vomiting or diarrhea.   You have blood or green matter (bile) in your vomit.   You  have blood in your stool or your stool looks black and tarry.   You have not urinated in 6 to 8 hours, or you have only urinated a small amount of very dark urine.   You have a fever.   You faint.  MAKE SURE YOU:   Understand these instructions.   Will watch your condition.   Will get help right away if you are not doing well or get worse.  Document Released: 03/17/2005 Document Revised: 03/06/2011 Document Reviewed: 11/04/2010 [facility] Patient Information 2012 [place].

## 2011-11-11 ENCOUNTER — Encounter (HOSPITAL_COMMUNITY): Payer: Self-pay | Admitting: Emergency Medicine

## 2011-11-11 ENCOUNTER — Other Ambulatory Visit: Payer: Self-pay

## 2011-11-11 ENCOUNTER — Emergency Department (HOSPITAL_COMMUNITY)
Admission: EM | Admit: 2011-11-11 | Discharge: 2011-11-11 | Disposition: A | Discharge status: Home / Self Care | Payer: 59 | Attending: Emergency Medicine | Admitting: Emergency Medicine

## 2011-11-11 ENCOUNTER — Emergency Department (HOSPITAL_COMMUNITY): Payer: 59

## 2011-11-11 DIAGNOSIS — R000 Tachycardia, unspecified: Secondary | ICD-10-CM | POA: Insufficient documentation

## 2011-11-11 DIAGNOSIS — K3184 Gastroparesis: Secondary | ICD-10-CM | POA: Insufficient documentation

## 2011-11-11 DIAGNOSIS — R109 Unspecified abdominal pain: Secondary | ICD-10-CM | POA: Insufficient documentation

## 2011-11-11 DIAGNOSIS — I10 Essential (primary) hypertension: Secondary | ICD-10-CM | POA: Insufficient documentation

## 2011-11-11 DIAGNOSIS — E1149 Type 2 diabetes mellitus with other diabetic neurological complication: Secondary | ICD-10-CM | POA: Insufficient documentation

## 2011-11-11 DIAGNOSIS — R079 Chest pain, unspecified: Secondary | ICD-10-CM | POA: Insufficient documentation

## 2011-11-11 DIAGNOSIS — R0602 Shortness of breath: Secondary | ICD-10-CM | POA: Insufficient documentation

## 2011-11-11 DIAGNOSIS — Z794 Long term (current) use of insulin: Secondary | ICD-10-CM | POA: Insufficient documentation

## 2011-11-11 LAB — URINE MICROSCOPIC-ADD ON

## 2011-11-11 LAB — POCT I-STAT TROPONIN I: Troponin i, poc: 0 ng/mL (ref 0.00–0.08)

## 2011-11-11 LAB — CBC
HCT: 41.4 % (ref 36.0–46.0)
Hemoglobin: 15.3 g/dL — ABNORMAL HIGH (ref 12.0–15.0)
MCH: 31.1 pg (ref 26.0–34.0)
MCHC: 37 g/dL — ABNORMAL HIGH (ref 30.0–36.0)
MCV: 84.1 fL (ref 78.0–100.0)
Platelets: 298 10*3/uL (ref 150–400)
RBC: 4.92 MIL/uL (ref 3.87–5.11)
RDW: 11.5 % (ref 11.5–15.5)
WBC: 4.2 10*3/uL (ref 4.0–10.5)

## 2011-11-11 LAB — URINALYSIS, ROUTINE W REFLEX MICROSCOPIC
Bilirubin Urine: NEGATIVE
Glucose, UA: NEGATIVE mg/dL
Hgb urine dipstick: NEGATIVE
Ketones, ur: 15 mg/dL — AB
Leukocytes, UA: NEGATIVE
Nitrite: NEGATIVE
Protein, ur: 30 mg/dL — AB
Specific Gravity, Urine: 1.016 (ref 1.005–1.030)
Urobilinogen, UA: 1 mg/dL (ref 0.0–1.0)
pH: 6.5 (ref 5.0–8.0)

## 2011-11-11 LAB — LIPASE, BLOOD: Lipase: 22 U/L (ref 11–59)

## 2011-11-11 LAB — BASIC METABOLIC PANEL
BUN: 7 mg/dL (ref 6–23)
CO2: 29 mEq/L (ref 19–32)
Calcium: 10.2 mg/dL (ref 8.4–10.5)
Chloride: 95 mEq/L — ABNORMAL LOW (ref 96–112)
Creatinine, Ser: 0.56 mg/dL (ref 0.50–1.10)
GFR calc Af Amer: >90 mL/min (ref >90)
GFR calc non Af Amer: >90 mL/min (ref >90)
Glucose, Bld: 220 mg/dL — ABNORMAL HIGH (ref 70–99)
Potassium: 4 mEq/L (ref 3.5–5.1)
Sodium: 135 mEq/L (ref 135–145)

## 2011-11-11 LAB — COMPREHENSIVE METABOLIC PANEL
ALT: 15 U/L (ref 0–35)
AST: 16 U/L (ref 0–37)
Albumin: 4 g/dL (ref 3.5–5.2)
Alkaline Phosphatase: 56 U/L (ref 39–117)
BUN: 7 mg/dL (ref 6–23)
CO2: 25 mEq/L (ref 19–32)
Calcium: 9.3 mg/dL (ref 8.4–10.5)
Chloride: 98 mEq/L (ref 96–112)
Creatinine, Ser: 0.53 mg/dL (ref 0.50–1.10)
GFR calc Af Amer: >90 mL/min (ref >90)
GFR calc non Af Amer: >90 mL/min (ref >90)
Glucose, Bld: 189 mg/dL — ABNORMAL HIGH (ref 70–99)
Potassium: 3.9 mEq/L (ref 3.5–5.1)
Sodium: 135 mEq/L (ref 135–145)
Total Bilirubin: 0.7 mg/dL (ref 0.3–1.2)
Total Protein: 7.2 g/dL (ref 6.0–8.3)

## 2011-11-11 LAB — LACTIC ACID, PLASMA: Lactic Acid, Venous: 1.3 mmol/L (ref 0.5–2.2)

## 2011-11-11 LAB — D-DIMER, QUANTITATIVE: D-Dimer, Quant: <0.22 ug/mL-FEU (ref 0.00–0.48)

## 2011-11-11 LAB — PREGNANCY, URINE: Preg Test, Ur: NEGATIVE

## 2011-11-11 IMAGING — CT CT ABD-PELV W/ CM
1 of 2 series · 15 of 32 positions shown, 19 images · IV contrast (omnipaque)
Comparison: [DATE]

CLINICAL DATA: Abdominal pain, nausea and vomiting.

CT ABDOMEN AND PELVIS WITH CONTRAST
TECHNIQUE: Multidetector CT imaging of the abdomen and pelvis was
performed following the standard protocol during bolus
administration of intravenous contrast.
Contrast: 80mL OMNIPAQUE IOHEXOL 300 MG/ML  SOLN

[Series 2: abd/pel with · axial · 0.69mm/px · z∈[-647,-232]mm · 15 of 91 slices shown, 19 images]
[im 4/91  soft-tissue]
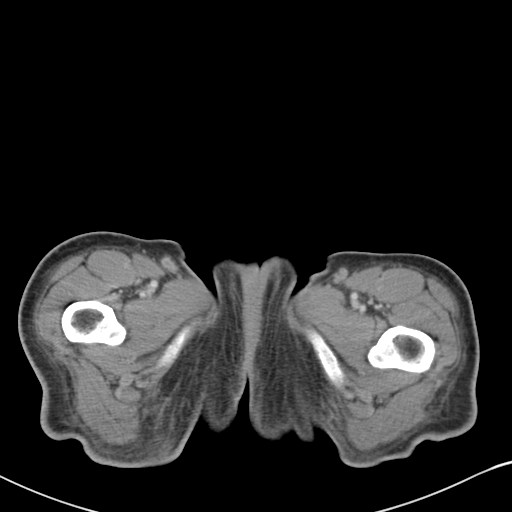
[im 4/91  bone]
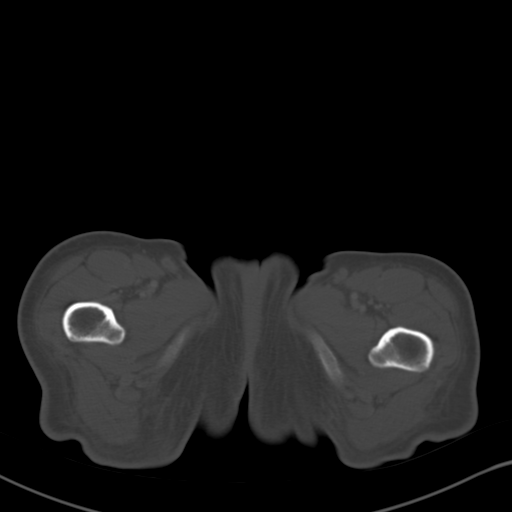
[im 11/91  soft-tissue]
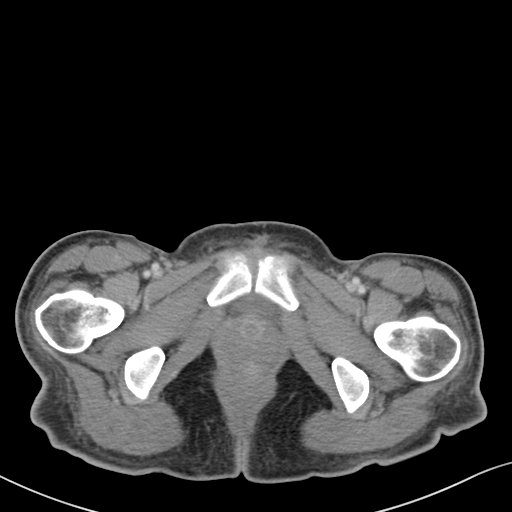
[im 19/91  soft-tissue]
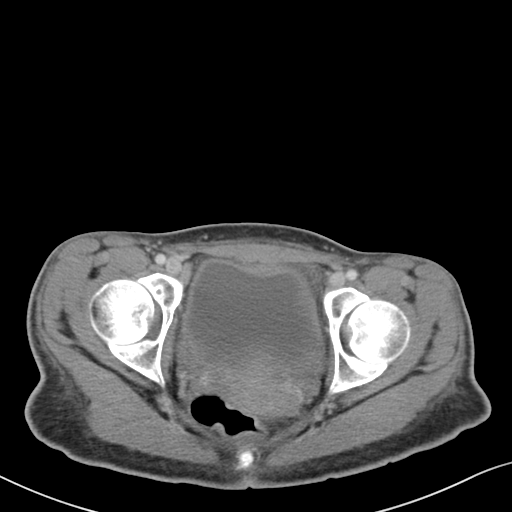
[im 26/91  soft-tissue]
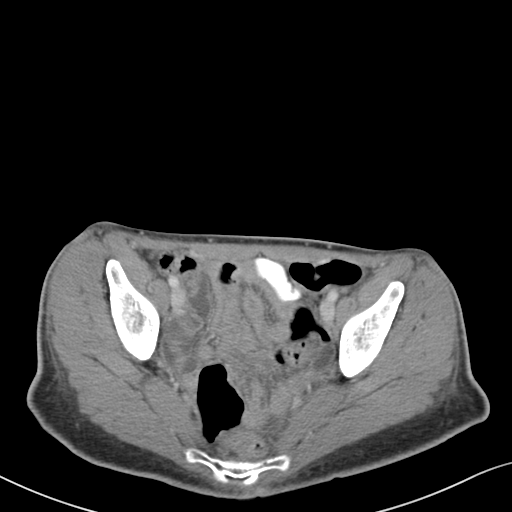
[im 33/91  soft-tissue]
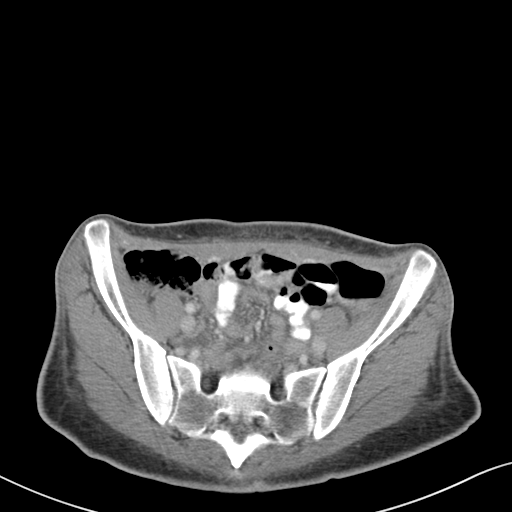
[im 40/91  soft-tissue]
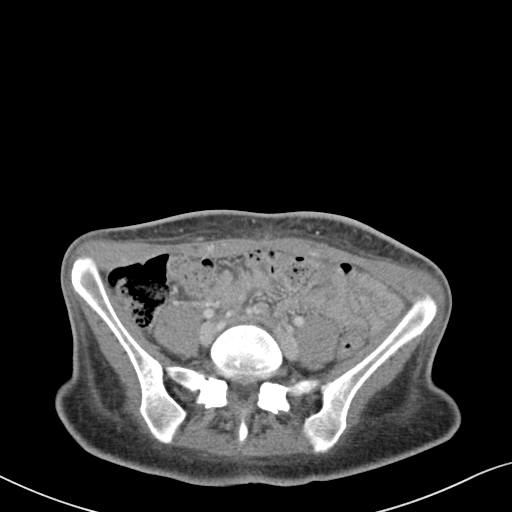
[im 47/91  soft-tissue]
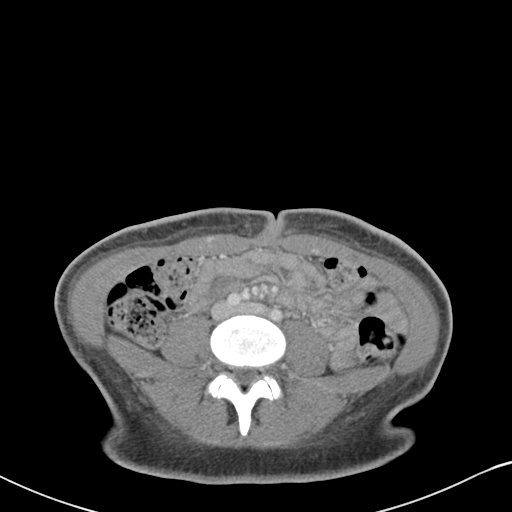
[im 51/91  soft-tissue]
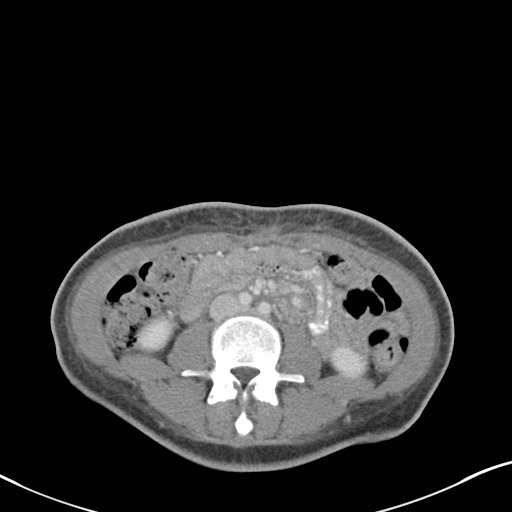
[im 58/91  soft-tissue]
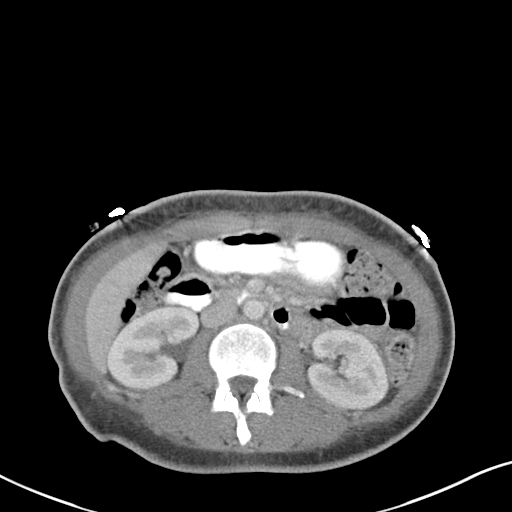
[im 58/91  bone]
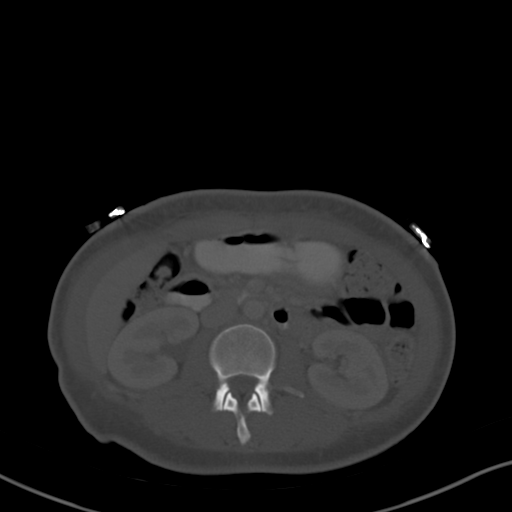
[im 65/91  soft-tissue]
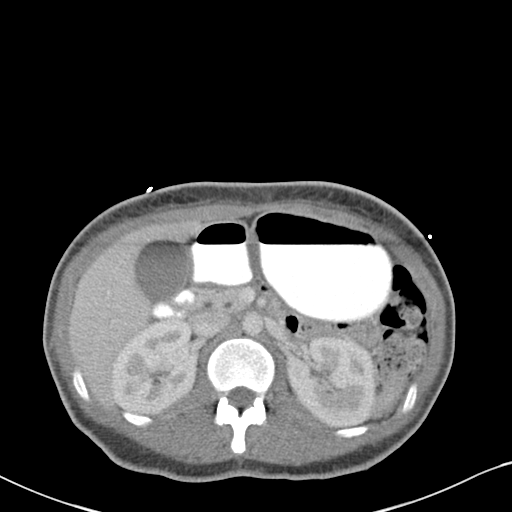
[im 73/91  soft-tissue]
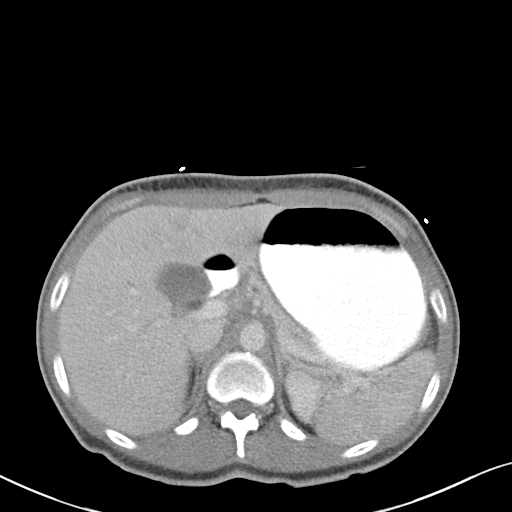
[im 76/91  lung]
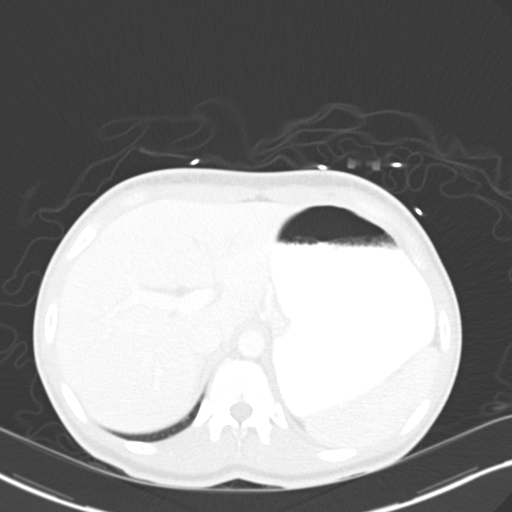
[im 80/91  soft-tissue]
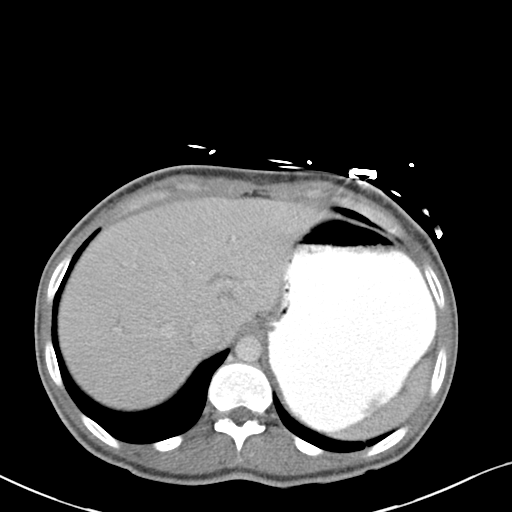
[im 80/91  lung]
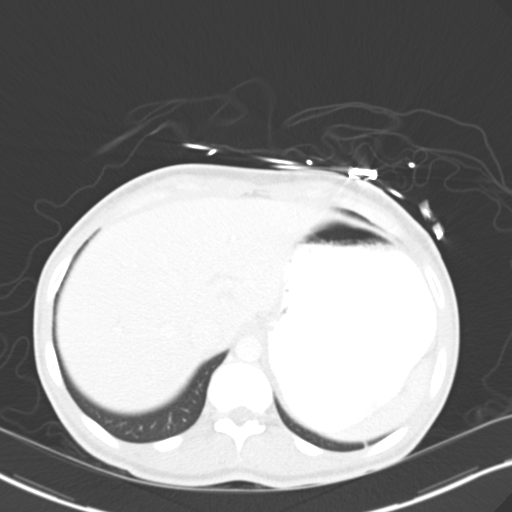
[im 83/91  lung]
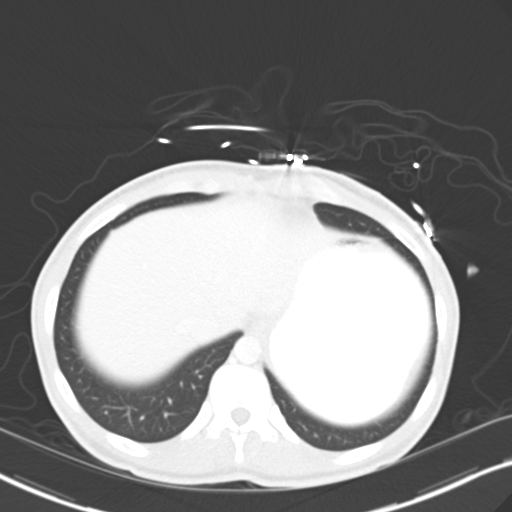
[im 87/91  soft-tissue]
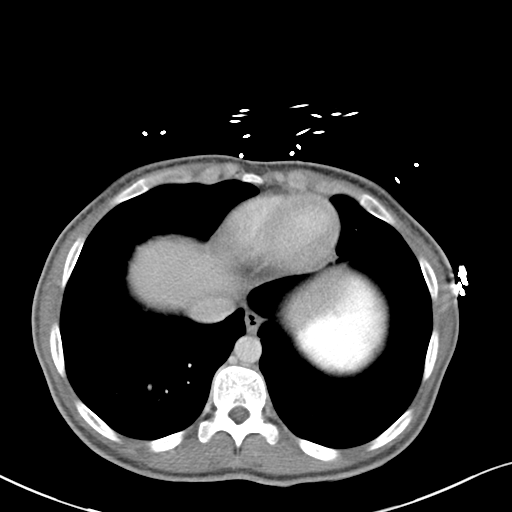
[im 87/91  lung]
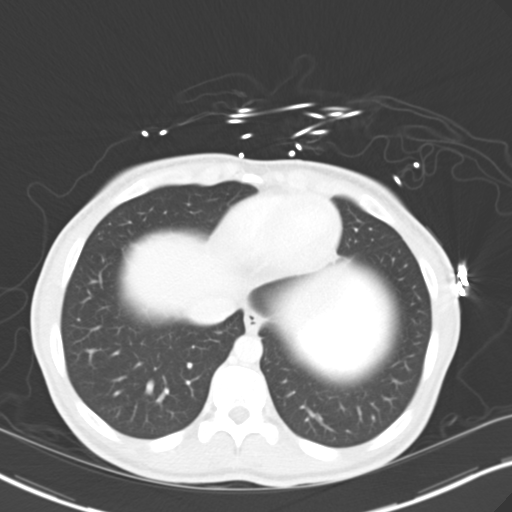

[15 of 32 positions shown; findings below may reference images not displayed]

FINDINGS: The liver, gallbladder, kidneys, spleen and adrenal
glands are unremarkable.  Ingested oral contrast is present in a
distended stomach and in the duodenum.  Very little contrast is
present in small bowel.  This may suggests some degree of
underlying gastroparesis.  There is no evidence of small bowel
dilatation or colonic abnormality.  No free intraperitoneal air.

The pancreas is atrophic.  No evidence of focal inflammatory
process, fluid collection or abscess.  The bladder is unremarkable.
No hernias are identified.

Edema in the subcutaneous fat noted previously is less prominent.
No focal masses or enlarged lymph nodes are identified.  Bony
structures are unremarkable.
IMPRESSION: 1.  Gastric distention with oral contrast.  This may suggest some
degree of gastroparesis.  No masses are identified.
2.  Less prominent subcutaneous edema.

## 2011-11-11 IMAGING — CR DG CHEST 1V PORT
1 series · 1 of 1 positions shown · non-contrast
Comparison: CT chest [DATE] and PA and lateral chest
[DATE].

CLINICAL DATA: Chest pain and shortness of breath.

PORTABLE CHEST - 1 VIEW

[AP]
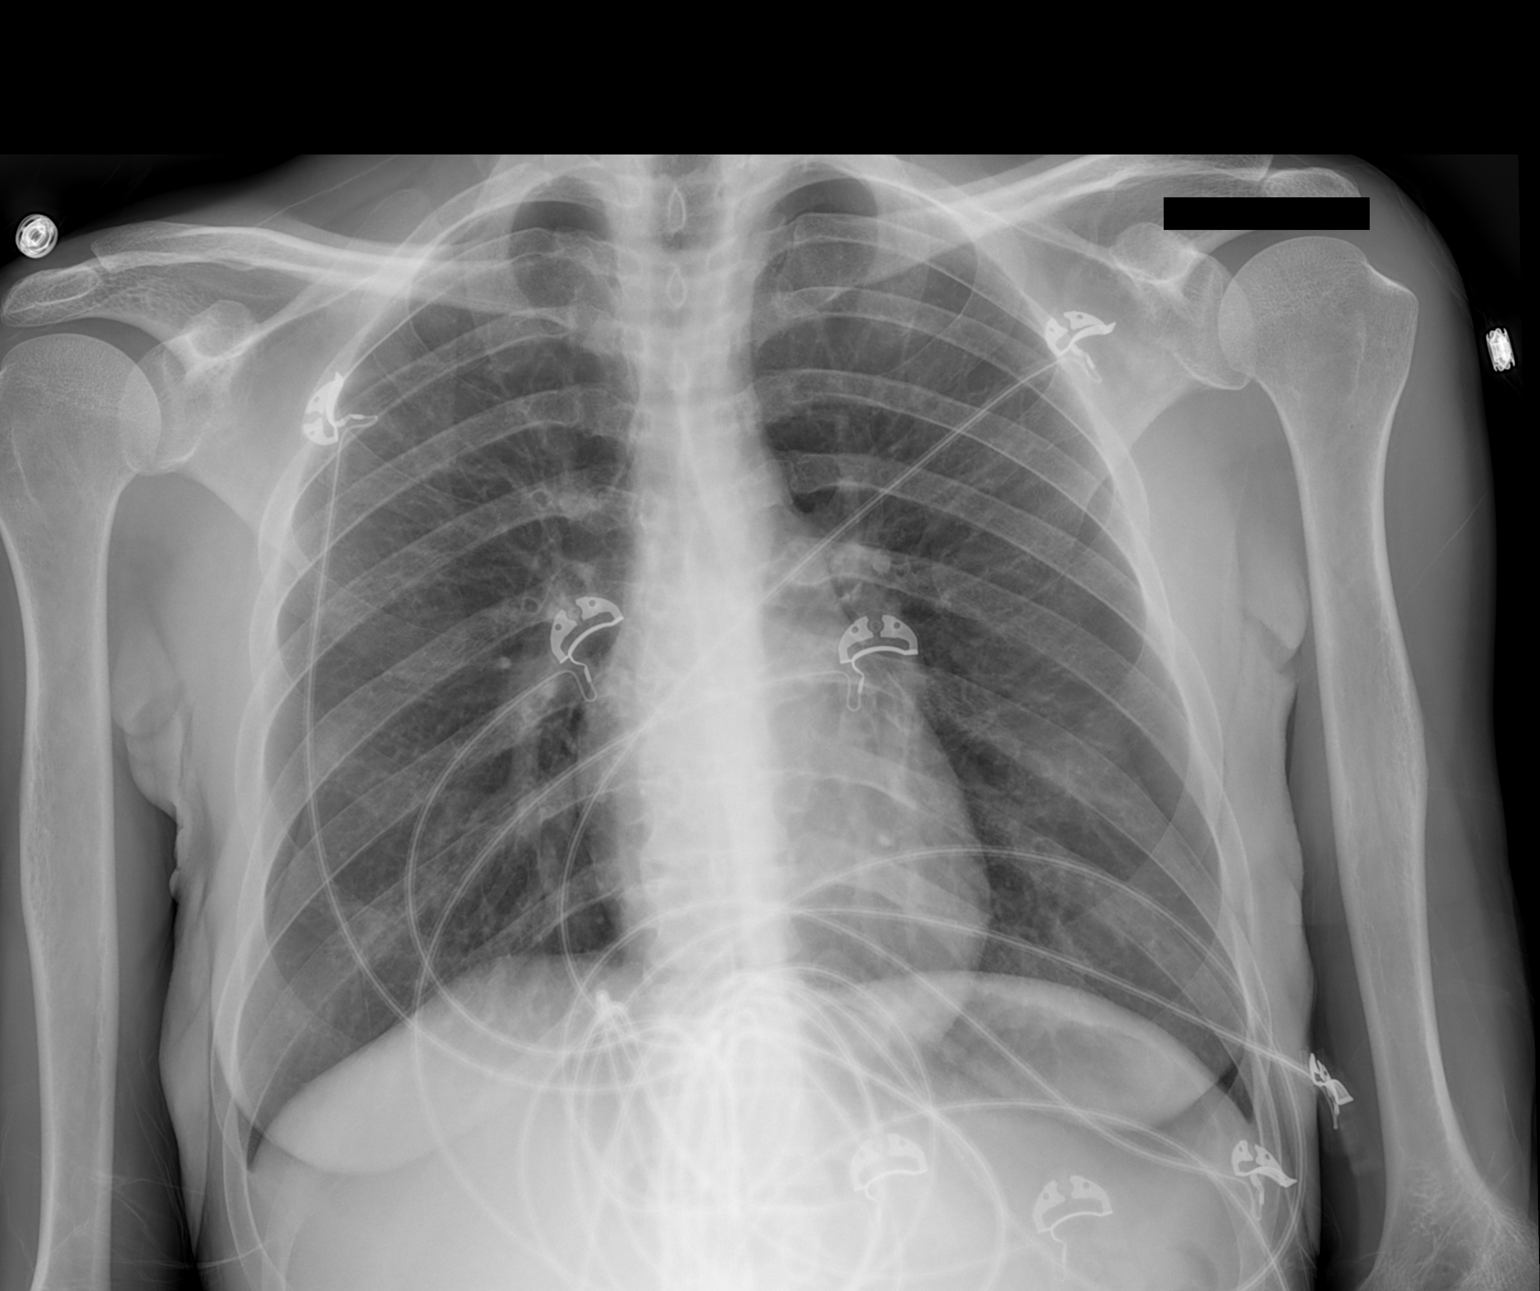

[1 of 1 positions shown; findings below may reference images not displayed]

FINDINGS: Lungs are clear.  Heart size is normal.  No pneumothorax
or pleural fluid.
IMPRESSION: Negative chest.

## 2011-11-11 MED ORDER — ONDANSETRON HCL 4 MG/2ML IJ SOLN
4.0000 mg | Freq: Once | INTRAMUSCULAR | Status: AC
  Administered 2011-11-11: 4 mg via INTRAVENOUS
  Filled 2011-11-11: qty 2

## 2011-11-11 MED ORDER — ASPIRIN 81 MG PO CHEW
324.0000 mg | CHEWABLE_TABLET | Freq: Once | ORAL | Status: AC
  Administered 2011-11-11: 324 mg via ORAL
  Filled 2011-11-11: qty 4

## 2011-11-11 MED ORDER — IOHEXOL 300 MG/ML  SOLN
80.0000 mL | Freq: Once | INTRAMUSCULAR | Status: AC | PRN
  Administered 2011-11-11: 80 mL via INTRAVENOUS

## 2011-11-11 MED ORDER — SODIUM CHLORIDE 0.9 % IV BOLUS (SEPSIS)
1000.0000 mL | Freq: Once | INTRAVENOUS | Status: AC
  Administered 2011-11-11: 1000 mL via INTRAVENOUS

## 2011-11-11 MED ORDER — HYDROMORPHONE HCL PF 1 MG/ML IJ SOLN
1.0000 mg | Freq: Once | INTRAMUSCULAR | Status: AC
  Administered 2011-11-11: 1 mg via INTRAVENOUS
  Filled 2011-11-11: qty 1

## 2011-11-11 NOTE — ED Provider Notes (Addendum)
History     CSN: [id number]  Arrival date & time 11/11/11  1452   First MD Initiated Contact with Patient 11/11/11 1540      Chief Complaint  Patient presents with  . Chest Pain    (Consider location/radiation/quality/duration/timing/severity/associated sxs/prior treatment) HPI Patient complaining of chest pain and abdominal pain.  Abdominal pain for one month, epigastric pain like burning, aching, nonradiating present constantly for a month with some decrease with vicodin.  Patient reports normal ultrasound and is scheduled for hida scan and upper gi with Dr. Collene Mares.  PMD [facility] sees NP Mabe.  Patient reports nausea, vomiting, diarrhea associated but not worsened by by food.  Weight loss of 20 lbs in past month.  DM since age 67.    Chest pain began today about 1230 to 1 pm.  Gradually worsening.  Aching anterior chest nonradiating.  Some sob.  Denies cough or fever.  Patient states she was lightheaded but resolved. Patietn reports no blood in stool or vomitus and that last cbc was normal.  Patient ate but vomited today.  BS running high at a70-220.  Periods regular , lmp July 15, normal.  Patient is sexually active and is not using birth control and has never been pregnant.  Denies abnormal vaginal discharge.  Past Medical History  Diagnosis Date  . Diabetes mellitus   . Hypertension     Past Surgical History  Procedure Date  . Ankle surgery     No family history on file.  History  Substance Use Topics  . Smoking status: Never Smoker   . Smokeless tobacco: Never Used  . Alcohol Use: Yes     Occasional    OB History    Grav Para Term Preterm Abortions TAB SAB Ect Mult Living                  Review of Systems  All other systems reviewed and are negative.    Allergies  Review of patient's allergies indicates no known allergies.  Home Medications   Current Outpatient Rx  Name Route Sig Dispense Refill  . ENALAPRIL MALEATE 10 MG PO TABS Oral Take  10 mg by mouth every evening.     Marland Kitchen HYDROCODONE-ACETAMINOPHEN 5-325 MG PO TABS Oral Take 1 tablet by mouth every 8 (eight) hours as needed. For pain    . INSULIN ASPART 100 UNIT/ML [place] SOLN Subcutaneous Inject 0-15 Units into the skin 3 (three) times daily before meals. 5 u with each meal + sliding scale: CBG 70 - 120: 0 u: CBG 121 - 150: 2 u; CBG 151 - 200: 3 u; CBG 201 - 250: 5 u; CBG 251 - 300: 8 u;CBG 301 - 350: 11 u; CBG 351 - 400: 15 u; CBG > 400 Call MD    . INSULIN GLARGINE 100 UNIT/ML [place] SOLN Subcutaneous Inject 30 Units into the skin at bedtime.     Marland Kitchen METFORMIN HCL 500 MG PO TABS Oral Take 500 mg by mouth 2 (two) times daily with a meal.    . METOCLOPRAMIDE HCL 10 MG PO TABS Oral Take 10 mg by mouth 4 (four) times daily.    . ADULT MULTIVITAMIN W/MINERALS CH Oral Take 1 tablet by mouth daily.    Marland Kitchen OMEPRAZOLE 20 MG PO CPDR Oral Take 20 mg by mouth daily.    Marland Kitchen ONDANSETRON HCL 4 MG PO TABS Oral Take 4 mg by mouth every 6 (six) hours. For nausea    . SUCRALFATE  1 G PO TABS Oral Take 1 g by mouth 4 (four) times daily.      BP 140/103  Pulse 128  Temp 98.3 F (36.8 C) (Oral)  Resp 32  SpO2 100%  LMP 10/15/2011  Physical Exam  Vitals reviewed. Constitutional: She appears well-developed and well-nourished.  HENT:  Head: Normocephalic and atraumatic.  Eyes: Conjunctivae and EOM are normal. Pupils are equal, round, and reactive to light.  Neck: Normal range of motion. Neck supple.  Cardiovascular: Normal heart sounds and intact distal pulses.  Tachycardia present.   Pulmonary/Chest: Effort normal and breath sounds normal.  Abdominal: Soft. Bowel sounds are normal.       Epigastric tenderness  Musculoskeletal: Normal range of motion.  Neurological: She is alert.  Skin: Skin is warm and dry.  Psychiatric: She has a normal mood and affect. Thought content normal.    ED Course  Procedures (including critical care time)  Labs Reviewed  CBC - Abnormal; Notable for the following:     Hemoglobin 15.3 (*)     MCHC 37.0 (*)     All other components within normal limits  BASIC METABOLIC PANEL   No results found.   No diagnosis found.   Date: 11/11/2011  Rate: 128  Rhythm: sinus tachycardia  QRS Axis: normal  Intervals: normal  ST/T Wave abnormalities: normal  Conduction Disutrbances:none  Narrative Interpretation:   Old EKG Reviewed: changes noted  Results for orders placed during the hospital encounter of 11/11/11  CBC      Component Value Range   WBC 4.2  4.0 - 10.5 K/uL   RBC 4.92  3.87 - 5.11 MIL/uL   Hemoglobin 15.3 (*) 12.0 - 15.0 g/dL   HCT 41.4  36.0 - 46.0 %   MCV 84.1  78.0 - 100.0 fL   MCH 31.1  26.0 - 34.0 pg   MCHC 37.0 (*) 30.0 - 36.0 g/dL   RDW 11.5  11.5 - 15.5 %   Platelets 298  150 - 400 K/uL  BASIC METABOLIC PANEL      Component Value Range   Sodium 135  135 - 145 mEq/L   Potassium 4.0  3.5 - 5.1 mEq/L   Chloride 95 (*) 96 - 112 mEq/L   CO2 29  19 - 32 mEq/L   Glucose, Bld 220 (*) 70 - 99 mg/dL   BUN 7  6 - 23 mg/dL   Creatinine, Ser 0.56  0.50 - 1.10 mg/dL   Calcium 10.2  8.4 - 10.5 mg/dL   GFR calc non Af Amer >90  >90 mL/min   GFR calc Af Amer >90  >90 mL/min  COMPREHENSIVE METABOLIC PANEL      Component Value Range   Sodium 135  135 - 145 mEq/L   Potassium 3.9  3.5 - 5.1 mEq/L   Chloride 98  96 - 112 mEq/L   CO2 25  19 - 32 mEq/L   Glucose, Bld 189 (*) 70 - 99 mg/dL   BUN 7  6 - 23 mg/dL   Creatinine, Ser 0.53  0.50 - 1.10 mg/dL   Calcium 9.3  8.4 - 10.5 mg/dL   Total Protein 7.2  6.0 - 8.3 g/dL   Albumin 4.0  3.5 - 5.2 g/dL   AST 16  0 - 37 U/L   ALT 15  0 - 35 U/L   Alkaline Phosphatase 56  39 - 117 U/L   Total Bilirubin 0.7  0.3 - 1.2 mg/dL   GFR calc  non Af Amer >90  >90 mL/min   GFR calc Af Amer >90  >90 mL/min  PREGNANCY, URINE      Component Value Range   Preg Test, Ur NEGATIVE  NEGATIVE  URINALYSIS, ROUTINE W REFLEX MICROSCOPIC      Component Value Range   Color, Urine YELLOW  YELLOW   APPearance CLEAR   CLEAR   Specific Gravity, Urine 1.016  1.005 - 1.030   pH 6.5  5.0 - 8.0   Glucose, UA NEGATIVE  NEGATIVE mg/dL   Hgb urine dipstick NEGATIVE  NEGATIVE   Bilirubin Urine NEGATIVE  NEGATIVE   Ketones, ur 15 (*) NEGATIVE mg/dL   Protein, ur 30 (*) NEGATIVE mg/dL   Urobilinogen, UA 1.0  0.0 - 1.0 mg/dL   Nitrite NEGATIVE  NEGATIVE   Leukocytes, UA NEGATIVE  NEGATIVE  LIPASE, BLOOD      Component Value Range   Lipase 22  11 - 59 U/L  D-DIMER, QUANTITATIVE      Component Value Range   D-Dimer, Quant <0.22  0.00 - 0.48 ug/mL-FEU  LACTIC ACID, PLASMA      Component Value Range   Lactic Acid, Venous 1.3  0.5 - 2.2 mmol/L  POCT I-STAT TROPONIN I      Component Value Range   Troponin i, poc 0.00  0.00 - 0.08 ng/mL   Comment 3           URINE MICROSCOPIC-ADD ON      Component Value Range   WBC, UA 0-2  <3 WBC/hpf   Casts HYALINE CASTS (*) NEGATIVE   Urine-Other MUCOUS PRESENT       MDM  Patient with CT which reveals gastroparesis. Patient has followup with Dr. Collene Mares this week for hepatobiliary scan. I discussed the results with the patient and her mother. Her heart rate is in the 90s on my reevaluation after a liter of normal saline. Patient with abdominal pain, volume depletion, iddm and weight loss.  Patient with hr down here after iv fluids and primrary care and gi follow up.       Shaune Pollack, MD 11/11/11 [place], MD 11/13/11 [phone]

## 2011-11-11 NOTE — ED Notes (Signed)
[id number]<AV> Expected date:11/11/11<BR> Expected time: 3:12 PM<BR> Means of arrival:<BR> Comments:<BR> Hold for res a

## 2011-11-11 NOTE — ED Notes (Signed)
Pt requesting something for pain.  MD notified.

## 2011-11-11 NOTE — ED Notes (Signed)
1 Liter bolus started of normal saline

## 2011-11-11 NOTE — ED Notes (Signed)
Pt states she had n/v x one today. Pt c/o mid-sternal chest pain since after vomiting this afternoon. Pt seen for same thing in ED last week. Pt states she took Norco at 1230 today and it is not helping her pain.

## 2011-11-12 ENCOUNTER — Emergency Department (HOSPITAL_COMMUNITY)
Admission: EM | Admit: 2011-11-12 | Discharge: 2011-11-12 | Disposition: A | Discharge status: Home / Self Care | Payer: 59 | Attending: Emergency Medicine | Admitting: Emergency Medicine

## 2011-11-12 ENCOUNTER — Encounter (HOSPITAL_COMMUNITY): Payer: Self-pay | Admitting: Emergency Medicine

## 2011-11-12 DIAGNOSIS — Z794 Long term (current) use of insulin: Secondary | ICD-10-CM | POA: Insufficient documentation

## 2011-11-12 DIAGNOSIS — S71009A Unspecified open wound, unspecified hip, initial encounter: Secondary | ICD-10-CM | POA: Insufficient documentation

## 2011-11-12 DIAGNOSIS — R109 Unspecified abdominal pain: Secondary | ICD-10-CM

## 2011-11-12 DIAGNOSIS — E119 Type 2 diabetes mellitus without complications: Secondary | ICD-10-CM | POA: Insufficient documentation

## 2011-11-12 DIAGNOSIS — S71109A Unspecified open wound, unspecified thigh, initial encounter: Secondary | ICD-10-CM | POA: Insufficient documentation

## 2011-11-12 DIAGNOSIS — Z79899 Other long term (current) drug therapy: Secondary | ICD-10-CM | POA: Insufficient documentation

## 2011-11-12 DIAGNOSIS — I10 Essential (primary) hypertension: Secondary | ICD-10-CM | POA: Insufficient documentation

## 2011-11-12 LAB — COMPREHENSIVE METABOLIC PANEL
ALT: 13 U/L (ref 0–35)
AST: 13 U/L (ref 0–37)
Albumin: 4.6 g/dL (ref 3.5–5.2)
Alkaline Phosphatase: 61 U/L (ref 39–117)
BUN: 6 mg/dL (ref 6–23)
CO2: 22 mEq/L (ref 19–32)
Calcium: 9.9 mg/dL (ref 8.4–10.5)
Chloride: 92 mEq/L — ABNORMAL LOW (ref 96–112)
Creatinine, Ser: 0.45 mg/dL — ABNORMAL LOW (ref 0.50–1.10)
GFR calc Af Amer: >90 mL/min (ref >90)
GFR calc non Af Amer: >90 mL/min (ref >90)
Glucose, Bld: 212 mg/dL — ABNORMAL HIGH (ref 70–99)
Potassium: 4 mEq/L (ref 3.5–5.1)
Sodium: 133 mEq/L — ABNORMAL LOW (ref 135–145)
Total Bilirubin: 0.9 mg/dL (ref 0.3–1.2)
Total Protein: 8 g/dL (ref 6.0–8.3)

## 2011-11-12 LAB — URINALYSIS, ROUTINE W REFLEX MICROSCOPIC
Bilirubin Urine: NEGATIVE
Glucose, UA: 100 mg/dL — AB
Hgb urine dipstick: NEGATIVE
Ketones, ur: >80 mg/dL — AB
Leukocytes, UA: NEGATIVE
Nitrite: NEGATIVE
Protein, ur: NEGATIVE mg/dL
Specific Gravity, Urine: 1.016 (ref 1.005–1.030)
Urobilinogen, UA: 1 mg/dL (ref 0.0–1.0)
pH: 5.5 (ref 5.0–8.0)

## 2011-11-12 LAB — CBC WITH DIFFERENTIAL/PLATELET
Basophils Absolute: 0 10*3/uL (ref 0.0–0.1)
Basophils Relative: 1 % (ref 0–1)
Eosinophils Absolute: 0 10*3/uL (ref 0.0–0.7)
Eosinophils Relative: 0 % (ref 0–5)
HCT: 40.1 % (ref 36.0–46.0)
Hemoglobin: 14.8 g/dL (ref 12.0–15.0)
Lymphocytes Relative: 37 % (ref 12–46)
Lymphs Abs: 1 10*3/uL (ref 0.7–4.0)
MCH: 31 pg (ref 26.0–34.0)
MCHC: 36.9 g/dL — ABNORMAL HIGH (ref 30.0–36.0)
MCV: 84.1 fL (ref 78.0–100.0)
Monocytes Absolute: 0.1 10*3/uL (ref 0.1–1.0)
Monocytes Relative: 5 % (ref 3–12)
Neutro Abs: 1.6 10*3/uL — ABNORMAL LOW (ref 1.7–7.7)
Neutrophils Relative %: 58 % (ref 43–77)
Platelets: 254 10*3/uL (ref 150–400)
RBC: 4.77 MIL/uL (ref 3.87–5.11)
RDW: 11.5 % (ref 11.5–15.5)
WBC: 2.7 10*3/uL — ABNORMAL LOW (ref 4.0–10.5)

## 2011-11-12 LAB — GLUCOSE, CAPILLARY: Glucose-Capillary: 196 mg/dL — ABNORMAL HIGH (ref 70–99)

## 2011-11-12 LAB — POCT PREGNANCY, URINE: Preg Test, Ur: NEGATIVE

## 2011-11-12 MED ORDER — SODIUM CHLORIDE 0.9 % IV BOLUS (SEPSIS)
1000.0000 mL | Freq: Once | INTRAVENOUS | Status: AC
  Administered 2011-11-12: 1000 mL via INTRAVENOUS

## 2011-11-12 MED ORDER — ONDANSETRON HCL 4 MG/2ML IJ SOLN
4.0000 mg | Freq: Once | INTRAMUSCULAR | Status: AC
  Administered 2011-11-12: 4 mg via INTRAVENOUS
  Filled 2011-11-12: qty 2

## 2011-11-12 MED ORDER — SODIUM CHLORIDE 0.9 % IV SOLN: 1000.0000 mL | INTRAVENOUS | Status: DC

## 2011-11-12 MED ORDER — HYDROMORPHONE HCL PF 1 MG/ML IJ SOLN
1.0000 mg | Freq: Once | INTRAMUSCULAR | Status: AC
  Administered 2011-11-12: 1 mg via INTRAVENOUS
  Filled 2011-11-12: qty 1

## 2011-11-12 MED ORDER — METOCLOPRAMIDE HCL 5 MG/ML IJ SOLN
10.0000 mg | Freq: Once | INTRAMUSCULAR | Status: AC
  Administered 2011-11-12: 10 mg via INTRAVENOUS
  Filled 2011-11-12: qty 2

## 2011-11-12 NOTE — ED Notes (Signed)
CBG 196 

## 2011-11-12 NOTE — ED Notes (Signed)
Pt presenting to ed with c/o abdominal pain with positive nausea and vomiting pt denies diarrhea at this time. Pt states normal bowel movement x 2 days ago. Pt states positive chest pain with dizziness and shortness of breath pt states seen here yesterday for the same symptoms

## 2011-11-12 NOTE — ED Notes (Addendum)
Pt requested additional pain med dilaudid, RN notified EDP. EDP states pt to take OTC pain meds upon DC. Dr Juleen China left for day and oncoming EDP without new orders.

## 2011-11-12 NOTE — ED Provider Notes (Signed)
History    21yF with abdominal pain. Gradual onset over a month ago. More of less constant. Mild relief with vicodin. No appreciable exacerbating factors. Nausea and vomiting. No fever or chills. No urinary complaints. NO unusual vaginal bleeding or discharge. Mild lower sternal CP. Constant since yesterday. No SOB. No unusual leg pain or swelling. Multiple recent evaluations, including yesterday.Returning today because of continued symptoms.  CSN: [id number]  Arrival date & time 11/12/11  1338   First MD Initiated Contact with Patient 11/12/11 1354      Chief Complaint  Patient presents with  . Abdominal Pain  . Chest Pain    (Consider location/radiation/quality/duration/timing/severity/associated sxs/prior treatment) HPI  Past Medical History  Diagnosis Date  . Diabetes mellitus   . Hypertension     Past Surgical History  Procedure Date  . Ankle surgery     No family history on file.  History  Substance Use Topics  . Smoking status: Never Smoker   . Smokeless tobacco: Never Used  . Alcohol Use: No     Occasional    OB History    Grav Para Term Preterm Abortions TAB SAB Ect Mult Living                  Review of Systems   Review of symptoms negative unless otherwise noted in HPI.   Allergies  Review of patient's allergies indicates no known allergies.  Home Medications   Current Outpatient Rx  Name Route Sig Dispense Refill  . ASPIRIN 325 MG PO TABS Oral Take 325 mg by mouth daily.    . ENALAPRIL MALEATE 10 MG PO TABS Oral Take 10 mg by mouth every evening.     Marland Kitchen HYDROCODONE-ACETAMINOPHEN 5-325 MG PO TABS Oral Take 1 tablet by mouth every 8 (eight) hours as needed. For pain    . INSULIN ASPART 100 UNIT/ML [place] SOLN Subcutaneous Inject 0-15 Units into the skin 3 (three) times daily before meals. 5 u with each meal + sliding scale: CBG 70 - 120: 0 u: CBG 121 - 150: 2 u; CBG 151 - 200: 3 u; CBG 201 - 250: 5 u; CBG 251 - 300: 8 u;CBG 301 - 350: 11 u; CBG 351 -  400: 15 u; CBG > 400 Call MD    . INSULIN GLARGINE 100 UNIT/ML [place] SOLN Subcutaneous Inject 30 Units into the skin at bedtime.     Marland Kitchen METFORMIN HCL 500 MG PO TABS Oral Take 500 mg by mouth 2 (two) times daily with a meal.    . METOCLOPRAMIDE HCL 10 MG PO TABS Oral Take 10 mg by mouth 4 (four) times daily.    . ADULT MULTIVITAMIN W/MINERALS CH Oral Take 1 tablet by mouth daily.    Marland Kitchen OMEPRAZOLE 20 MG PO CPDR Oral Take 20 mg by mouth daily.    Marland Kitchen ONDANSETRON HCL 4 MG PO TABS Oral Take 4 mg by mouth every 6 (six) hours. For nausea    . SUCRALFATE 1 G PO TABS Oral Take 1 g by mouth 4 (four) times daily.      BP 148/100  Pulse 131  Temp 98.7 F (37.1 C) (Oral)  Resp 18  SpO2 100%  LMP 10/15/2011  Physical Exam  Nursing note and vitals reviewed. Constitutional: She appears well-developed and well-nourished. No distress.  HENT:  Head: Normocephalic and atraumatic.  Eyes: Conjunctivae are normal. Right eye exhibits no discharge. Left eye exhibits no discharge.  Neck: Neck supple.  Cardiovascular: Regular rhythm and  normal heart sounds.  Exam reveals no gallop and no friction rub.   No murmur heard.      tachycardic  Pulmonary/Chest: Effort normal and breath sounds normal. No respiratory distress.  Abdominal: Soft. She exhibits no distension and no mass. There is tenderness. There is no rebound.       Epigastric and RUQ tenderness.  Genitourinary:       No cva tenderness.  Musculoskeletal: She exhibits no edema and no tenderness.  Neurological: She is alert.  Skin: Skin is warm and dry.  Psychiatric: She has a normal mood and affect. Her behavior is normal. Thought content normal.    ED Course  Procedures (including critical care time)  Labs Reviewed  URINALYSIS, ROUTINE W REFLEX MICROSCOPIC - Abnormal; Notable for the following:    Glucose, UA 100 (*)     Ketones, ur >80 (*)     All other components within normal limits  COMPREHENSIVE METABOLIC PANEL - Abnormal; Notable for the  following:    Sodium 133 (*)     Chloride 92 (*)     Glucose, Bld 212 (*)     Creatinine, Ser 0.45 (*)     All other components within normal limits  CBC WITH DIFFERENTIAL - Abnormal; Notable for the following:    WBC 2.7 (*)     MCHC 36.9 (*)  PRE-WARMING TECHNIQUE USED   Neutro Abs 1.6 (*)     All other components within normal limits  GLUCOSE, CAPILLARY - Abnormal; Notable for the following:    Glucose-Capillary 196 (*)     All other components within normal limits  POCT PREGNANCY, URINE   Ct Abdomen Pelvis W Contrast  11/11/2011  *RADIOLOGY REPORT*  Clinical Data: Abdominal pain, nausea and vomiting.  CT ABDOMEN AND PELVIS WITH CONTRAST  Technique:  Multidetector CT imaging of the abdomen and pelvis was performed following the standard protocol during bolus administration of intravenous contrast.  Contrast: 93m OMNIPAQUE IOHEXOL 300 MG/ML  SOLN  Comparison: 10/22/2011  Findings: The liver, gallbladder, kidneys, spleen and adrenal glands are unremarkable.  Ingested oral contrast is present in a distended stomach and in the duodenum.  Very little contrast is present in small bowel.  This may suggests some degree of underlying gastroparesis.  There is no evidence of small bowel dilatation or colonic abnormality.  No free intraperitoneal air.  The pancreas is atrophic.  No evidence of focal inflammatory process, fluid collection or abscess.  The bladder is unremarkable. No hernias are identified.  Edema in the subcutaneous fat noted previously is less prominent. No focal masses or enlarged lymph nodes are identified.  Bony structures are unremarkable.  IMPRESSION:  1.  Gastric distention with oral contrast.  This may suggest some degree of gastroparesis.  No masses are identified. 2.  Less prominent subcutaneous edema.  Original Report Authenticated By: GAzzie Roup M.D.   Dg Chest Port 1 View  11/11/2011  *RADIOLOGY REPORT*  Clinical Data: Chest pain and shortness of breath.  PORTABLE  CHEST - 1 VIEW  Comparison: CT chest 11/07/2011 and PA and lateral chest 09/18/2011.  Findings: Lungs are clear.  Heart size is normal.  No pneumothorax or pleural fluid.  IMPRESSION: Negative chest.  Original Report Authenticated By: TArvid Right D'ALESSIO, M.D.   EKG:  Rhythm: sinus tach Rate: 127 Axis: normal Intervals: normal ST segments: normal Comparison: stable from yesterday    1. Abdominal pain       MDM  21yF with abdominal pain and  nausea which becoming chronic in nature and CP since yesterday although she has had it previously as well. Pt has had multiple previous evaluations for abdominal pain w/ imaging including RUQ [place] and CT a/p. CT yesterday suggestive of possible gastroparesis with most of contrast staying retained in stomach.  With hx of diabetes this is a consideration. Previous imaging otherwise fairly unremarkable.  Without acute change in symptoms do not feel that further imaging in ED of much utility.  Pt has GI physician, Dr Collene Mares. Pt reports HIDA scan scheduled for 8/16.Repeat testing today consistent with prior. Neutropenic but this noted on multiple prior labs.  Pt's CP is atypical for ACS given constant nature. Results from yesterday reviewed. EKG stable Had normal troponin and d-dimer yesterday. I suspect that this may be related to process that is causing abdominal pain. I have a low suspicion for emergent etiology. Pt and mother pleasant, but understandably frustrated with lack of definitive diagnosis and ongoing symptoms. I do not find a basis to admit pt to the hospital though.         Virgel Manifold, MD 11/12/11 1626

## 2011-11-14 ENCOUNTER — Other Ambulatory Visit: Payer: Self-pay | Admitting: Diagnostic Radiology

## 2011-11-14 ENCOUNTER — Encounter (HOSPITAL_COMMUNITY): Payer: Self-pay | Admitting: Family Medicine

## 2011-11-14 ENCOUNTER — Encounter (HOSPITAL_COMMUNITY)
Admission: RE | Admit: 2011-11-14 | Discharge: 2011-11-14 | Disposition: A | Discharge status: Home / Self Care | Payer: 59 | Source: Ambulatory Visit | Attending: Gastroenterology | Admitting: Gastroenterology

## 2011-11-14 ENCOUNTER — Other Ambulatory Visit (HOSPITAL_COMMUNITY): Payer: 59

## 2011-11-14 ENCOUNTER — Other Ambulatory Visit: Payer: Self-pay

## 2011-11-14 ENCOUNTER — Other Ambulatory Visit: Payer: Self-pay | Admitting: Gastroenterology

## 2011-11-14 ENCOUNTER — Inpatient Hospital Stay (HOSPITAL_COMMUNITY)
Admission: EM | Admit: 2011-11-14 | Discharge: 2011-11-17 | DRG: 418 | Disposition: A | Discharge status: Home / Self Care | Payer: 59 | Attending: Surgery | Admitting: Surgery

## 2011-11-14 DIAGNOSIS — E1169 Type 2 diabetes mellitus with other specified complication: Secondary | ICD-10-CM

## 2011-11-14 DIAGNOSIS — IMO0002 Reserved for concepts with insufficient information to code with codable children: Secondary | ICD-10-CM | POA: Insufficient documentation

## 2011-11-14 DIAGNOSIS — E1159 Type 2 diabetes mellitus with other circulatory complications: Secondary | ICD-10-CM | POA: Diagnosis present

## 2011-11-14 DIAGNOSIS — E1142 Type 2 diabetes mellitus with diabetic polyneuropathy: Secondary | ICD-10-CM | POA: Diagnosis present

## 2011-11-14 DIAGNOSIS — K3184 Gastroparesis: Secondary | ICD-10-CM | POA: Diagnosis present

## 2011-11-14 DIAGNOSIS — R110 Nausea: Secondary | ICD-10-CM | POA: Insufficient documentation

## 2011-11-14 DIAGNOSIS — I10 Essential (primary) hypertension: Secondary | ICD-10-CM | POA: Diagnosis present

## 2011-11-14 DIAGNOSIS — E1065 Type 1 diabetes mellitus with hyperglycemia: Secondary | ICD-10-CM | POA: Insufficient documentation

## 2011-11-14 DIAGNOSIS — K811 Chronic cholecystitis: Principal | ICD-10-CM | POA: Diagnosis present

## 2011-11-14 DIAGNOSIS — R109 Unspecified abdominal pain: Secondary | ICD-10-CM | POA: Insufficient documentation

## 2011-11-14 DIAGNOSIS — Z681 Body mass index (BMI) 19 or less, adult: Secondary | ICD-10-CM

## 2011-11-14 DIAGNOSIS — IMO0001 Reserved for inherently not codable concepts without codable children: Secondary | ICD-10-CM | POA: Diagnosis present

## 2011-11-14 DIAGNOSIS — E1165 Type 2 diabetes mellitus with hyperglycemia: Secondary | ICD-10-CM | POA: Diagnosis present

## 2011-11-14 DIAGNOSIS — I152 Hypertension secondary to endocrine disorders: Secondary | ICD-10-CM

## 2011-11-14 DIAGNOSIS — K6289 Other specified diseases of anus and rectum: Secondary | ICD-10-CM

## 2011-11-14 DIAGNOSIS — R634 Abnormal weight loss: Secondary | ICD-10-CM | POA: Diagnosis present

## 2011-11-14 HISTORY — DX: Reserved for concepts with insufficient information to code with codable children: IMO0002

## 2011-11-14 HISTORY — DX: Type 1 diabetes mellitus with hyperglycemia: E10.65

## 2011-11-14 LAB — URINALYSIS, ROUTINE W REFLEX MICROSCOPIC
Glucose, UA: 500 mg/dL — AB
Hgb urine dipstick: NEGATIVE
Ketones, ur: >80 mg/dL — AB
Leukocytes, UA: NEGATIVE
Nitrite: NEGATIVE
Protein, ur: 30 mg/dL — AB
Specific Gravity, Urine: 1.018 (ref 1.005–1.030)
Urobilinogen, UA: 1 mg/dL (ref 0.0–1.0)
pH: 6 (ref 5.0–8.0)

## 2011-11-14 LAB — CBC
HCT: 41.4 % (ref 36.0–46.0)
Hemoglobin: 15.3 g/dL — ABNORMAL HIGH (ref 12.0–15.0)
MCH: 31.4 pg (ref 26.0–34.0)
MCHC: 37 g/dL — ABNORMAL HIGH (ref 30.0–36.0)
MCV: 84.8 fL (ref 78.0–100.0)
Platelets: 287 10*3/uL (ref 150–400)
RBC: 4.88 MIL/uL (ref 3.87–5.11)
RDW: 11.6 % (ref 11.5–15.5)
WBC: 3.7 10*3/uL — ABNORMAL LOW (ref 4.0–10.5)

## 2011-11-14 LAB — COMPREHENSIVE METABOLIC PANEL
ALT: 10 U/L (ref 0–35)
AST: 11 U/L (ref 0–37)
Albumin: 4.5 g/dL (ref 3.5–5.2)
Alkaline Phosphatase: 62 U/L (ref 39–117)
BUN: 6 mg/dL (ref 6–23)
CO2: 27 mEq/L (ref 19–32)
Calcium: 9.9 mg/dL (ref 8.4–10.5)
Chloride: 91 mEq/L — ABNORMAL LOW (ref 96–112)
Creatinine, Ser: 0.46 mg/dL — ABNORMAL LOW (ref 0.50–1.10)
GFR calc Af Amer: >90 mL/min (ref >90)
GFR calc non Af Amer: >90 mL/min (ref >90)
Glucose, Bld: 298 mg/dL — ABNORMAL HIGH (ref 70–99)
Potassium: 3.6 mEq/L (ref 3.5–5.1)
Sodium: 132 mEq/L — ABNORMAL LOW (ref 135–145)
Total Bilirubin: 0.6 mg/dL (ref 0.3–1.2)
Total Protein: 8 g/dL (ref 6.0–8.3)

## 2011-11-14 LAB — LACTIC ACID, PLASMA: Lactic Acid, Venous: 1.2 mmol/L (ref 0.5–2.2)

## 2011-11-14 LAB — PRO B NATRIURETIC PEPTIDE: Pro B Natriuretic peptide (BNP): 20 pg/mL (ref 0–125)

## 2011-11-14 LAB — GLUCOSE, CAPILLARY
Glucose-Capillary: 288 mg/dL — ABNORMAL HIGH (ref 70–99)
Glucose-Capillary: 257 mg/dL — ABNORMAL HIGH (ref 70–99)
Glucose-Capillary: 162 mg/dL — ABNORMAL HIGH (ref 70–99)

## 2011-11-14 LAB — PROTIME-INR
INR: 1.14 (ref 0.00–1.49)
Prothrombin Time: 14.8 seconds (ref 11.6–15.2)

## 2011-11-14 LAB — URINE MICROSCOPIC-ADD ON

## 2011-11-14 LAB — MAGNESIUM: Magnesium: 1.8 mg/dL (ref 1.5–2.5)

## 2011-11-14 LAB — APTT: aPTT: 30 seconds (ref 24–37)

## 2011-11-14 IMAGING — NM NM HEPATOBILIARY IMAGE, INC GB
1 series · 12 of 12 positions shown · non-contrast
Comparison: None.

CLINICAL DATA: NUCLEAR MEDICINE HEPATOBILIARY IMAGING
TECHNIQUE: Sequential images of the abdomen were obtained [DATE] minutes following intravenous administration of
radiopharmaceutical. Due to nonvisualization of the gallbladder,
the patient was injected with an additional 1.0 mCi technetium 99m
Choletec and slow intravenous infusion of 2.2 mg morphine was
administered, while sequential images were continued for another 30
minutes.

Radiopharmaceutical:  [XV] [XV] Choletec

[Series 1: hepato · 4.46mm/px · 2 acquisitions, 12 frames shown]
[im 1/2]
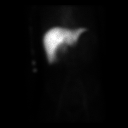
[im 1/2]
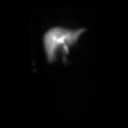
[im 1/2]
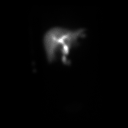
[im 1/2]
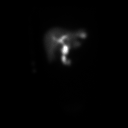
[im 1/2]
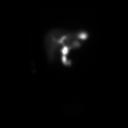
[im 1/2]
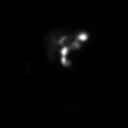
[im 2/2]
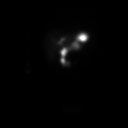
[im 2/2]
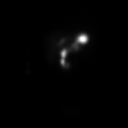
[im 2/2]
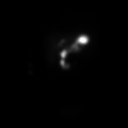
[im 2/2]
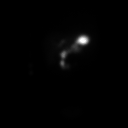
[im 2/2]
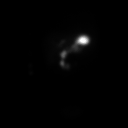
[im 2/2]
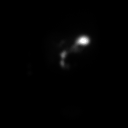

[12 of 12 positions shown; findings below may reference images not displayed]

FINDINGS: There is prompt radiopharmaceutical uptake by the liver.
Liver is normal appearance.  There is prompt biliary excretion
activity, with activity initially seen within the duodenum at 15
minutes.  Reflux of biliary activity into the stomach is seen which
showed progression over 90 minutes.  No gallbladder activity is
identified either before or following intravenous infusion of
morphine.
IMPRESSION: 1.  Nonvisualization of gallbladder, consistent with cystic duct
obstruction.
2.  Prominent bile reflux into the stomach.
3.  No evidence of biliary obstruction.

## 2011-11-14 MED ORDER — INSULIN ASPART 100 UNIT/ML ~~LOC~~ SOLN: 0.0000 [IU] | Freq: Three times a day (TID) | SUBCUTANEOUS | Status: DC

## 2011-11-14 MED ORDER — ADULT MULTIVITAMIN W/MINERALS CH
1.0000 | ORAL_TABLET | Freq: Every day | ORAL | Status: DC
  Filled 2011-11-14 (×3): qty 1

## 2011-11-14 MED ORDER — INSULIN GLARGINE 100 UNIT/ML ~~LOC~~ SOLN
15.0000 [IU] | Freq: Every day | SUBCUTANEOUS | Status: DC
  Administered 2011-11-14 – 2011-11-15 (×2): 15 [IU] via SUBCUTANEOUS

## 2011-11-14 MED ORDER — LACTATED RINGERS IV BOLUS (SEPSIS): 1000.0000 mL | Freq: Three times a day (TID) | INTRAVENOUS | Status: AC | PRN

## 2011-11-14 MED ORDER — ALUM & MAG HYDROXIDE-SIMETH 200-200-20 MG/5ML PO SUSP: 30.0000 mL | Freq: Four times a day (QID) | ORAL | Status: DC | PRN

## 2011-11-14 MED ORDER — ONDANSETRON HCL 4 MG PO TABS
4.0000 mg | ORAL_TABLET | Freq: Four times a day (QID) | ORAL | Status: DC
  Administered 2011-11-14 – 2011-11-17 (×7): 4 mg via ORAL
  Filled 2011-11-14 (×15): qty 1

## 2011-11-14 MED ORDER — PROMETHAZINE HCL 25 MG/ML IJ SOLN
12.5000 mg | Freq: Four times a day (QID) | INTRAMUSCULAR | Status: DC | PRN
  Administered 2011-11-15: 25 mg via INTRAVENOUS
  Filled 2011-11-14 (×2): qty 1

## 2011-11-14 MED ORDER — ACETAMINOPHEN 650 MG RE SUPP: 650.0000 mg | Freq: Four times a day (QID) | RECTAL | Status: DC | PRN

## 2011-11-14 MED ORDER — INSULIN ASPART 100 UNIT/ML ~~LOC~~ SOLN
0.0000 [IU] | Freq: Three times a day (TID) | SUBCUTANEOUS | Status: DC
  Administered 2011-11-14: 5 [IU] via SUBCUTANEOUS
  Administered 2011-11-15 (×2): 2 [IU] via SUBCUTANEOUS
  Administered 2011-11-16: 3 [IU] via SUBCUTANEOUS
  Administered 2011-11-16: 1 [IU] via SUBCUTANEOUS

## 2011-11-14 MED ORDER — PANTOPRAZOLE SODIUM 40 MG PO TBEC
80.0000 mg | DELAYED_RELEASE_TABLET | Freq: Every day | ORAL | Status: DC
  Administered 2011-11-15 – 2011-11-16 (×2): 80 mg via ORAL
  Filled 2011-11-14 (×4): qty 2

## 2011-11-14 MED ORDER — MORPHINE SULFATE 4 MG/ML IJ SOLN: 2.2000 mg | Freq: Once | INTRAMUSCULAR | Status: DC

## 2011-11-14 MED ORDER — ACETAMINOPHEN 325 MG PO TABS: 650.0000 mg | ORAL_TABLET | Freq: Four times a day (QID) | ORAL | Status: DC | PRN

## 2011-11-14 MED ORDER — TECHNETIUM TC 99M MEBROFENIN IV KIT
6.0000 | PACK | Freq: Once | INTRAVENOUS | Status: AC | PRN
  Administered 2011-11-14: 6 via INTRAVENOUS

## 2011-11-14 MED ORDER — DIPHENHYDRAMINE HCL 50 MG/ML IJ SOLN: 12.5000 mg | Freq: Four times a day (QID) | INTRAMUSCULAR | Status: DC | PRN

## 2011-11-14 MED ORDER — METOCLOPRAMIDE HCL 10 MG PO TABS
10.0000 mg | ORAL_TABLET | Freq: Four times a day (QID) | ORAL | Status: DC
  Administered 2011-11-14 – 2011-11-17 (×9): 10 mg via ORAL
  Filled 2011-11-14 (×14): qty 1

## 2011-11-14 MED ORDER — CIPROFLOXACIN IN D5W 400 MG/200ML IV SOLN
400.0000 mg | Freq: Two times a day (BID) | INTRAVENOUS | Status: DC
  Administered 2011-11-14 – 2011-11-15 (×2): 400 mg via INTRAVENOUS
  Filled 2011-11-14 (×2): qty 200

## 2011-11-14 MED ORDER — SUCRALFATE 1 G PO TABS
1.0000 g | ORAL_TABLET | Freq: Four times a day (QID) | ORAL | Status: DC
  Administered 2011-11-14 – 2011-11-15 (×3): 1 g via ORAL
  Filled 2011-11-14 (×11): qty 1

## 2011-11-14 MED ORDER — ONDANSETRON HCL 4 MG/2ML IJ SOLN
4.0000 mg | Freq: Four times a day (QID) | INTRAMUSCULAR | Status: DC | PRN
  Administered 2011-11-14: 4 mg via INTRAVENOUS
  Filled 2011-11-14 (×2): qty 2

## 2011-11-14 MED ORDER — SODIUM CHLORIDE 0.9 % IV SOLN
3.0000 g | Freq: Four times a day (QID) | INTRAVENOUS | Status: DC
  Administered 2011-11-14 – 2011-11-15 (×3): 3 g via INTRAVENOUS
  Filled 2011-11-14 (×3): qty 3

## 2011-11-14 MED ORDER — METOPROLOL TARTRATE 25 MG PO TABS
25.0000 mg | ORAL_TABLET | Freq: Two times a day (BID) | ORAL | Status: DC | PRN
  Administered 2011-11-14: 25 mg via ORAL
  Filled 2011-11-14: qty 1

## 2011-11-14 MED ORDER — HYDROMORPHONE HCL PF 1 MG/ML IJ SOLN
0.5000 mg | INTRAMUSCULAR | Status: DC | PRN
  Administered 2011-11-14 (×2): 1 mg via INTRAVENOUS
  Administered 2011-11-14: 0.5 mg via INTRAVENOUS
  Administered 2011-11-15 – 2011-11-16 (×6): 1 mg via INTRAVENOUS
  Filled 2011-11-14 (×9): qty 1

## 2011-11-14 MED ORDER — POTASSIUM CHLORIDE IN NACL 20-0.9 MEQ/L-% IV SOLN
INTRAVENOUS | Status: DC
  Administered 2011-11-14: 1000 mL via INTRAVENOUS
  Administered 2011-11-15 – 2011-11-16 (×2): via INTRAVENOUS
  Filled 2011-11-14 (×4): qty 1000

## 2011-11-14 MED ORDER — PANTOPRAZOLE SODIUM 40 MG IV SOLR: 40.0000 mg | Freq: Every day | INTRAVENOUS | Status: DC

## 2011-11-14 MED ORDER — INSULIN ASPART 100 UNIT/ML ~~LOC~~ SOLN: 0.0000 [IU] | Freq: Every day | SUBCUTANEOUS | Status: DC

## 2011-11-14 MED ORDER — HYDRALAZINE HCL 20 MG/ML IJ SOLN
5.0000 mg | Freq: Four times a day (QID) | INTRAMUSCULAR | Status: DC | PRN
  Filled 2011-11-14: qty 0.25

## 2011-11-14 MED ORDER — ENALAPRIL MALEATE 10 MG PO TABS
10.0000 mg | ORAL_TABLET | Freq: Every evening | ORAL | Status: DC
  Administered 2011-11-14 – 2011-11-16 (×3): 10 mg via ORAL
  Filled 2011-11-14 (×4): qty 1

## 2011-11-14 MED ORDER — MORPHINE SULFATE 4 MG/ML IJ SOLN
2.2000 mg | Freq: Once | INTRAMUSCULAR | Status: AC
  Administered 2011-11-14: 2.2 mg via INTRAVENOUS

## 2011-11-14 MED ORDER — MAGIC MOUTHWASH
15.0000 mL | Freq: Four times a day (QID) | ORAL | Status: DC | PRN
Start: 2011-11-14 — End: 2011-11-17
  Filled 2011-11-14: qty 15

## 2011-11-14 MED ORDER — MAGNESIUM HYDROXIDE 400 MG/5ML PO SUSP: 30.0000 mL | Freq: Two times a day (BID) | ORAL | Status: DC | PRN

## 2011-11-14 NOTE — ED Provider Notes (Signed)
History     CSN: [id number]  Arrival date & time 11/14/11  1520   None     Chief Complaint  Patient presents with  . Abdominal Pain    + HIDA scan    (Consider location/radiation/quality/duration/timing/severity/associated sxs/prior treatment) HPI  Past Medical History  Diagnosis Date  . Diabetes mellitus   . Hypertension     Past Surgical History  Procedure Date  . Ankle surgery     History reviewed. No pertinent family history.  History  Substance Use Topics  . Smoking status: Never Smoker   . Smokeless tobacco: Never Used  . Alcohol Use: No     Occasional    OB History    Grav Para Term Preterm Abortions TAB SAB Ect Mult Living                  Review of Systems  Allergies  Review of patient's allergies indicates no known allergies.  Home Medications   Current Outpatient Rx  Name Route Sig Dispense Refill  . ASPIRIN 325 MG PO TABS Oral Take 325 mg by mouth daily.    . ENALAPRIL MALEATE 10 MG PO TABS Oral Take 10 mg by mouth every evening.     Marland Kitchen HYDROCODONE-ACETAMINOPHEN 5-325 MG PO TABS Oral Take 1 tablet by mouth every 8 (eight) hours as needed. For pain    . INSULIN ASPART 100 UNIT/ML [place] SOLN Subcutaneous Inject 0-15 Units into the skin 3 (three) times daily before meals. 5 u with each meal + sliding scale: CBG 70 - 120: 0 u: CBG 121 - 150: 2 u; CBG 151 - 200: 3 u; CBG 201 - 250: 5 u; CBG 251 - 300: 8 u;CBG 301 - 350: 11 u; CBG 351 - 400: 15 u; CBG > 400 Call MD    . INSULIN GLARGINE 100 UNIT/ML [place] SOLN Subcutaneous Inject 30 Units into the skin at bedtime.     Marland Kitchen METFORMIN HCL 500 MG PO TABS Oral Take 500 mg by mouth 2 (two) times daily with a meal.    . METOCLOPRAMIDE HCL 10 MG PO TABS Oral Take 10 mg by mouth 4 (four) times daily.    . ADULT MULTIVITAMIN W/MINERALS CH Oral Take 1 tablet by mouth daily.    Marland Kitchen OMEPRAZOLE 20 MG PO CPDR Oral Take 20 mg by mouth daily.    Marland Kitchen ONDANSETRON HCL 4 MG PO TABS Oral Take 4 mg by mouth every 6 (six) hours. For  nausea    . SUCRALFATE 1 G PO TABS Oral Take 1 g by mouth 4 (four) times daily.      BP 144/108  Pulse 130  Temp 98.2 F (36.8 C) (Oral)  Resp 18  SpO2 100%  LMP 10/16/2011  Physical Exam  ED Course  Procedures (including critical care time)  Labs Reviewed  GLUCOSE, CAPILLARY - Abnormal; Notable for the following:    Glucose-Capillary 288 (*)     All other components within normal limits   Nm Hepatobiliary  11/14/2011  *RADIOLOGY REPORT*  Clinical Data:  NUCLEAR MEDICINE HEPATOBILIARY IMAGING  Technique:  Sequential images of the abdomen were obtained out to 90 minutes following intravenous administration of radiopharmaceutical. Due to nonvisualization of the gallbladder, the patient was injected with an additional 1.0 mCi technetium 10m Choletec and slow intravenous infusion of 2.2 mg morphine was administered, while sequential images were continued for another 30 minutes.  Radiopharmaceutical:  5.33mCi Tc-87m Choletec  Comparison:  None.  Findings: There is prompt  radiopharmaceutical uptake by the liver. Liver is normal appearance.  There is prompt biliary excretion activity, with activity initially seen within the duodenum at 15 minutes.  Reflux of biliary activity into the stomach is seen which showed progression over 90 minutes.  No gallbladder activity is identified either before or following intravenous infusion of morphine.  IMPRESSION:  1.  Nonvisualization of gallbladder, consistent with cystic duct obstruction. 2.  Prominent bile reflux into the stomach. 3.  No evidence of biliary obstruction.  Original Report Authenticated By: Danae Orleans, M.D.     No diagnosis found.    MDM  Not my patient. Not seen by me        Doug Sou, MD 11/14/11 2321

## 2011-11-14 NOTE — H&P (Signed)
Patient seen with WJ,PA. She does have some mild RUQ tender, and positive HIDA scan fr systic duct obstruction. Has obviously lost weight. Labs still pending but had LOW wbc a few days ago and no prior evidence for acute cholecystitis.   Med evaluation pending. She has some evidence for gastroparesis. Will need more discussion, but likely lap chole.

## 2011-11-14 NOTE — H&P (Signed)
[name] is an 21 y.o. female.   Chief Complaint: Abdominal pain nausea and vomiting HPI: Patient is a 21 year old who has had abdominal pain, nausea and vomiting for the last 5 weeks. She has multiple emergency room visit. She was hospitalized 09/17/2011 with DKA and dehydration. CT scan 09/18/2011 was normal. Another CT scan 10/22/2011 was also normal. Abdominal ultrasound 11/06/11 showed no cholelithiasis no gallbladder wall thickening no pericholecystic fluid and a negative Murphy sign. CT scan of the chest H./913 was negative for pulmonary embolus. Chest x-ray 813 was normal CT scan 11/11/2011 showed gastric distention with oral contrast, but again was otherwise normal. She was seen by Dr. Collene Mares and underwent a HIDA scan today. Which shows nonvisualization of the cystic duct, consistent with obstruction. There was also prominent bile reflux into the stomach no evidence of biliary obstruction. Patient was referred to the emergency room by Dr. Collene Mares and our service. We plan to admit the patient for chronic cholecystitis. She is tachycardic and hypertensive, with a history of type 1 diabetes and poor control. We will ask medicine to see in help with medical management prior to cholecystectomy. Past Medical History  Diagnosis Date  . Diabetes mellitus type 1 since age 13.  HBA1C 12.9   . Hypertension, currently with poor control Weight loss Hx of recurrent DKA  Last hospitalization 09/17/11.     Past Surgical History  Procedure Date  . Ankle surgery     History reviewed. No pertinent family history. Social History:  reports that she has never smoked. She has never used smokeless tobacco. She reports that she does not drink alcohol or use illicit drugs.  Allergies: No Known Allergies Prior to Admission medications   Medication Sig Start Date End Date Taking? Authorizing Provider  aspirin 325 MG tablet Take 325 mg by mouth daily.   Yes Historical Provider, MD  Pt reports this was given for  tachycardia last hospital visit.  enalapril (VASOTEC) 10 MG tablet Take 10 mg by mouth every evening.    Yes Historical Provider, MD  HYDROcodone-acetaminophen (NORCO/VICODIN) 5-325 MG per tablet Take 1 tablet by mouth every 8 (eight) hours as needed. For pain 11/07/11 11/17/11 Yes Varney Biles, MD  insulin aspart (NOVOLOG) 100 UNIT/ML injection Inject 0-15 Units into the skin 3 (three) times daily before meals. 5 u with each meal + sliding scale: CBG 70 - 120: 0 u: CBG 121 - 150: 2 u; CBG 151 - 200: 3 u; CBG 201 - 250: 5 u; CBG 251 - 300: 8 u;CBG 301 - 350: 11 u; CBG 351 - 400: 15 u; CBG > 400 Call MD 09/20/11 09/19/12 Yes [name], MD  insulin glargine (LANTUS) 100 UNIT/ML injection Inject 30 Units into the skin at bedtime.  09/20/11  Yes Venetia Maxon Rama, MD  metFORMIN (GLUCOPHAGE) 500 MG tablet Take 500 mg by mouth 2 (two) times daily with a meal.   Yes Historical Provider, MD  metoCLOPramide (REGLAN) 10 MG tablet Take 10 mg by mouth 4 (four) times daily.   Yes Historical Provider, MD  Multiple Vitamin (MULTIVITAMIN WITH MINERALS) TABS Take 1 tablet by mouth daily.   Yes Historical Provider, MD  omeprazole (PRILOSEC) 20 MG capsule Take 20 mg by mouth daily.   Yes Historical Provider, MD  ondansetron (ZOFRAN) 4 MG tablet Take 4 mg by mouth every 6 (six) hours. For nausea 11/07/11 11/14/11 Yes Varney Biles, MD  sucralfate (CARAFATE) 1 G tablet Take 1 g by mouth 4 (four) times  daily.   Yes Historical Provider, MD     (Not in a hospital admission)  Results for orders placed during the hospital encounter of 11/14/11 (from the past 48 hour(s))  GLUCOSE, CAPILLARY     Status: Abnormal   Collection Time   11/14/11  3:46 PM      Component Value Range Comment   Glucose-Capillary 288 (*) 70 - 99 mg/dL    Comment 1 Notify RN      Nm Hepatobiliary  11/14/2011  *RADIOLOGY REPORT*  Clinical Data:  NUCLEAR MEDICINE HEPATOBILIARY IMAGING  Technique:  Sequential images of the abdomen were obtained out  to 90 minutes following intravenous administration of radiopharmaceutical. Due to nonvisualization of the gallbladder, the patient was injected with an additional 1.0 mCi technetium 76mCholetec and slow intravenous infusion of 2.2 mg morphine was administered, while sequential images were continued for another 30 minutes.  Radiopharmaceutical:  5.074m Tc-9967moletec  Comparison:  None.  Findings: There is prompt radiopharmaceutical uptake by the liver. Liver is normal appearance.  There is prompt biliary excretion activity, with activity initially seen within the duodenum at 15 minutes.  Reflux of biliary activity into the stomach is seen which showed progression over 90 minutes.  No gallbladder activity is identified either before or following intravenous infusion of morphine.  IMPRESSION:  1.  Nonvisualization of gallbladder, consistent with cystic duct obstruction. 2.  Prominent bile reflux into the stomach. 3.  No evidence of biliary obstruction.  Original Report Authenticated By: JOHMarlaine Hind.D.    Review of Systems  Constitutional: Positive for weight loss (20 pounds last 5 weeks.  she was 250 pounds in high school and is now down to 125 with multiple bouts of DKA.). Negative for fever, chills and diaphoresis.  HENT: Negative.   Eyes: Negative.   Respiratory: Negative.   Cardiovascular: Positive for leg swelling (after standing for a long time at work.). Negative for chest pain, palpitations, orthopnea, claudication and PND.       She was give Asprin last hospital visit for Tachycardia   Gastrointestinal: Positive for heartburn (chronic), nausea, vomiting, abdominal pain (pain all the time now this has been going on for 5 weeks.) and diarrhea. Negative for constipation, blood in stool and melena.  Genitourinary: Negative.   Musculoskeletal:       She has some knee pain at times.  Skin: Negative.   Neurological: Negative.  Negative for weakness.  Endo/Heme/Allergies: Negative.     Psychiatric/Behavioral: Negative.     Blood pressure 144/108, pulse 130, temperature 98.2 F (36.8 C), temperature source Oral, resp. rate 18, last menstrual period 10/16/2011, SpO2 100.00%. Physical Exam  Constitutional: She is oriented to person, place, and time.       Thin cachectic AAF NAD.  It is clear she has lost weight on abdominal exam and in fact has lost 125 pounds since high school  HENT:  Head: Normocephalic and atraumatic.  Nose: Nose normal.  Eyes: Conjunctivae and EOM are normal. Pupils are equal, round, and reactive to light. Right eye exhibits no discharge. Left eye exhibits no discharge. No scleral icterus.  Neck: Normal range of motion. Neck supple. No JVD present. No tracheal deviation present. Thyromegaly present.  Cardiovascular: Regular rhythm, normal heart sounds and intact distal pulses.   No murmur heard.      Tachycardic  Respiratory: Effort normal and breath sounds normal. No stridor. No respiratory distress. She has no wheezes. She has no rales. She exhibits no tenderness.  GI:  Soft. Bowel sounds are normal. She exhibits no distension and no mass. There is tenderness (tender RUQ). There is no rebound and no guarding.  Musculoskeletal: Normal range of motion. She exhibits no edema.       Both feet are cold, her husband says she's like that all the time.  Lymphadenopathy:    She has no cervical adenopathy.  Neurological: She is alert and oriented to person, place, and time. She has normal reflexes. No cranial nerve deficit.  Skin: Skin is warm and dry. No erythema.  Psychiatric: She has a normal mood and affect. Her behavior is normal. Judgment and thought content normal.     Assessment/Plan 1. Chronic acalculous cholecystitis. 2. 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  She is on metformin also. 3. Hypertension currently with poor control. 4. Gastroparesis by CT scan 5. Significant weight loss, 20 pounds last 4-5 weeks. 125 pound since  graduation from high school. 6. Leukopenia on her last CBC 11/12/11.  Plan: Marya Amsler going to admit Will check her labs, EKG, begin hydration, start her antibiotics. I've asked medicine to see in consultation to help with management of her diabetes, tachycardia, hypertension, and gastroparesis. Currently BP 140/95 with pain, pain medicine is ordered. HR 118, EKG ordered, 100% Sat.   [facility] physician assistant for Dr. Neldon Mc.  Shenaya Lebo 11/14/2011, 4:58 PM

## 2011-11-14 NOTE — ED Notes (Signed)
Patient not to be seen by EDP. Pt will be assessed by surgery. Surgery consult in process.

## 2011-11-14 NOTE — ED Notes (Signed)
Pt reports she had a + HIDA scan today and PCP told her to come to ED for removal of gallbladder. Rates pain 10/10.

## 2011-11-14 NOTE — ED Notes (Signed)
Pt reports upper abdominal pain x1 month.  Pt had a HIDA scan ordered by MD Loreta Ave and had patient come to ER for gallbladder removal.  Pt also reports nausea and vomiting earlier today, but is not currently nauseated. Pt reports multiple episodes of diarrhea yesterday.  Pt rates abdominal pain 8/10.  Pt had morphine prior to scan today.

## 2011-11-14 NOTE — ED Notes (Signed)
288

## 2011-11-14 NOTE — Consult Note (Signed)
[facility] Medical Consultation  [name] KWI:[id number] DOB: 20-Feb-1991 DOA: 11/14/2011 PCP: [name], NP   Requesting physician: ER Date of consultation: 11/14/2011 Reason for consultation: consult for DM/HTN  Impression/Recommendations Active Problems:  Diabetes type 1, uncontrolled  Hypertension associated with diabetes  Chronic cholecystitis  Gastroparesis    1. DM- give half of her normal insulin tonight if plans for surgery tomm and NPO tonight, continue SSI, check HgbA1C, hold metformin, continue IVF 2. HTN- high currently probably due to pain, on recent admission was controlled, continue home meds 3. Chronic cholecystitis- for cholecystectomy Gastroparesis-continue reglan when eating   I will followup again tomorrow. Please contact me if I can be of assistance in the meanwhile. Thank you for this consultation.  Chief Complaint: abdominal pain  HPI:  21 yo female with abdominal pain, nausea and vomiting.  She has a history of DM with episodes of DKA.  She says her BS at home have been 100-175.  Blood pressure has been controlled as well. +weight loss +n/v/abdominal pain/diarrhea  A CT scan 10/22/2011 was normal. Abdominal ultrasound 11/06/11 showed no cholelithiasis no gallbladder wall thickening no pericholecystic fluid and a negative Murphy sign. CT scan 11/11/2011 showed gastric distention with oral contrast, but again was otherwise normal. She was seen by Dr. Collene Mares and underwent a HIDA scan today. Which shows nonvisualization of the cystic duct, consistent with obstruction.  She is being admitted by surgery today for cholecystectomy.     Review of Systems:  All systems reviewed, negative unless stated above  Past Medical History  Diagnosis Date  . Diabetes mellitus   . Hypertension   . Diabetes type 1, uncontrolled 11/14/2011    Since age 56   Past Surgical History  Procedure Date  . Ankle surgery    Social History:  reports that she has never  smoked. She has never used smokeless tobacco. She reports that she does not drink alcohol or use illicit drugs.  No Known Allergies Family History- +HTN  Prior to Admission medications   Medication Sig Start Date End Date Taking? Authorizing Provider  aspirin 325 MG tablet Take 325 mg by mouth daily.   Yes Historical Provider, MD  enalapril (VASOTEC) 10 MG tablet Take 10 mg by mouth every evening.    Yes Historical Provider, MD  HYDROcodone-acetaminophen (NORCO/VICODIN) 5-325 MG per tablet Take 1 tablet by mouth every 8 (eight) hours as needed. For pain 11/07/11 11/17/11 Yes Varney Biles, MD  insulin aspart (NOVOLOG) 100 UNIT/ML injection Inject 0-15 Units into the skin 3 (three) times daily before meals. 5 u with each meal + sliding scale: CBG 70 - 120: 0 u: CBG 121 - 150: 2 u; CBG 151 - 200: 3 u; CBG 201 - 250: 5 u; CBG 251 - 300: 8 u;CBG 301 - 350: 11 u; CBG 351 - 400: 15 u; CBG > 400 Call MD 09/20/11 09/19/12 Yes [name], MD  insulin glargine (LANTUS) 100 UNIT/ML injection Inject 30 Units into the skin at bedtime.  09/20/11  Yes Venetia Maxon Rama, MD  metFORMIN (GLUCOPHAGE) 500 MG tablet Take 500 mg by mouth 2 (two) times daily with a meal.   Yes Historical Provider, MD  metoCLOPramide (REGLAN) 10 MG tablet Take 10 mg by mouth 4 (four) times daily.   Yes Historical Provider, MD  Multiple Vitamin (MULTIVITAMIN WITH MINERALS) TABS Take 1 tablet by mouth daily.   Yes Historical Provider, MD  omeprazole (PRILOSEC) 20 MG capsule Take 20 mg by mouth daily.  Yes Historical Provider, MD  ondansetron (ZOFRAN) 4 MG tablet Take 4 mg by mouth every 6 (six) hours. For nausea 11/07/11 11/14/11 Yes Varney Biles, MD  sucralfate (CARAFATE) 1 G tablet Take 1 g by mouth 4 (four) times daily.   Yes Historical Provider, MD   Physical Exam: Blood pressure 144/108, pulse 130, temperature 98.2 F (36.8 C), temperature source Oral, resp. rate 18, last menstrual period 10/16/2011, SpO2 100.00%. Filed Vitals:    11/14/11 1545  BP: 144/108  Pulse: 130  Temp: 98.2 F (36.8 C)  TempSrc: Oral  Resp: 18  SpO2: 100%     General:  A+Ox3, appears uncomfortable  Eyes: wnl  ENT: wnl  Neck: no JVD  Cardiovascular: tachy, no murmurs  Respiratory: clear anterior  Abdomen: mild RUQ tenderness, +BS  Skin: no rashes or lesions  Musculoskeletal: move all 4 extremities  Psychiatric: normal mood and affect  Neurologic: no focal deficits  Labs on Admission:  Basic Metabolic Panel:  Lab 30/13/14 1455 11/11/11 1610 11/11/11 1535  NA 133* 135 135  K 4.0 3.9 4.0  CL 92* 98 95*  CO2 22 25 29   GLUCOSE 212* 189* 220*  BUN 6 7 7   CREATININE 0.45* 0.53 0.56  CALCIUM 9.9 9.3 10.2  MG -- -- --  PHOS -- -- --   Liver Function Tests:  Lab 11/12/11 1455 11/11/11 1610  AST 13 16  ALT 13 15  ALKPHOS 61 56  BILITOT 0.9 0.7  PROT 8.0 7.2  ALBUMIN 4.6 4.0    Lab 11/11/11 1610  LIPASE 22  AMYLASE --   No results found for this basename: AMMONIA:5 in the last 168 hours CBC:  Lab 11/12/11 1455 11/11/11 1535  WBC 2.7* 4.2  NEUTROABS 1.6* --  HGB 14.8 15.3*  HCT 40.1 41.4  MCV 84.1 84.1  PLT 254 298   Cardiac Enzymes: No results found for this basename: CKTOTAL:5,CKMB:5,CKMBINDEX:5,TROPONINI:5 in the last 168 hours BNP: No components found with this basename: POCBNP:5 CBG:  Lab 11/14/11 1546 11/12/11 1401  GLUCAP 288* 196*    Radiological Exams on Admission: Nm Hepatobiliary  11/14/2011  *RADIOLOGY REPORT*  Clinical Data:  NUCLEAR MEDICINE HEPATOBILIARY IMAGING  Technique:  Sequential images of the abdomen were obtained out to 90 minutes following intravenous administration of radiopharmaceutical. Due to nonvisualization of the gallbladder, the patient was injected with an additional 1.0 mCi technetium 33mCholetec and slow intravenous infusion of 2.2 mg morphine was administered, while sequential images were continued for another 30 minutes.  Radiopharmaceutical:  5.040m [id number]  Comparison:  None.  Findings: There is prompt radiopharmaceutical uptake by the liver. Liver is normal appearance.  There is prompt biliary excretion activity, with activity initially seen within the duodenum at 15 minutes.  Reflux of biliary activity into the stomach is seen which showed progression over 90 minutes.  No gallbladder activity is identified either before or following intravenous infusion of morphine.  IMPRESSION:  1.  Nonvisualization of gallbladder, consistent with cystic duct obstruction. 2.  Prominent bile reflux into the stomach. 3.  No evidence of biliary obstruction.  Original Report Authenticated By: JOHMarlaine Hind.D.      Time spent: 70 min  VANEliseo SquiresSSICA [facility] Pager [phone] 7PM-7AM, please contact night-coverage [email or web address] Password [email or web address]16/2013, 4:58 PM

## 2011-11-14 NOTE — ED Notes (Signed)
Surgery Consult at pt bedside

## 2011-11-15 ENCOUNTER — Inpatient Hospital Stay (HOSPITAL_COMMUNITY): Payer: 59 | Admitting: Anesthesiology

## 2011-11-15 ENCOUNTER — Encounter (HOSPITAL_COMMUNITY): Admission: EM | Disposition: A | Discharge status: Home / Self Care | Payer: Self-pay | Source: Home / Self Care

## 2011-11-15 ENCOUNTER — Encounter (HOSPITAL_COMMUNITY): Payer: Self-pay | Admitting: Anesthesiology

## 2011-11-15 ENCOUNTER — Inpatient Hospital Stay (HOSPITAL_COMMUNITY): Payer: 59

## 2011-11-15 DIAGNOSIS — K6289 Other specified diseases of anus and rectum: Secondary | ICD-10-CM

## 2011-11-15 HISTORY — PX: CHOLECYSTECTOMY: SHX55

## 2011-11-15 LAB — COMPREHENSIVE METABOLIC PANEL
ALT: 8 U/L (ref 0–35)
AST: 11 U/L (ref 0–37)
Albumin: 3.5 g/dL (ref 3.5–5.2)
Alkaline Phosphatase: 46 U/L (ref 39–117)
BUN: 6 mg/dL (ref 6–23)
CO2: 28 mEq/L (ref 19–32)
Calcium: 9.1 mg/dL (ref 8.4–10.5)
Chloride: 97 mEq/L (ref 96–112)
Creatinine, Ser: 0.41 mg/dL — ABNORMAL LOW (ref 0.50–1.10)
GFR calc Af Amer: >90 mL/min (ref >90)
GFR calc non Af Amer: >90 mL/min (ref >90)
Glucose, Bld: 135 mg/dL — ABNORMAL HIGH (ref 70–99)
Potassium: 3.6 mEq/L (ref 3.5–5.1)
Sodium: 134 mEq/L — ABNORMAL LOW (ref 135–145)
Total Bilirubin: 0.6 mg/dL (ref 0.3–1.2)
Total Protein: 6.4 g/dL (ref 6.0–8.3)

## 2011-11-15 LAB — CBC
HCT: 34.4 % — ABNORMAL LOW (ref 36.0–46.0)
Hemoglobin: 12.7 g/dL (ref 12.0–15.0)
MCH: 31.1 pg (ref 26.0–34.0)
MCHC: 36.9 g/dL — ABNORMAL HIGH (ref 30.0–36.0)
MCV: 84.1 fL (ref 78.0–100.0)
Platelets: 227 10*3/uL (ref 150–400)
RBC: 4.09 MIL/uL (ref 3.87–5.11)
RDW: 11.5 % (ref 11.5–15.5)
WBC: 4.1 10*3/uL (ref 4.0–10.5)

## 2011-11-15 LAB — HEMOGLOBIN A1C
Hgb A1c MFr Bld: 6.6 % — ABNORMAL HIGH (ref <5.7)
Mean Plasma Glucose: 143 mg/dL — ABNORMAL HIGH (ref <117)

## 2011-11-15 LAB — GLUCOSE, CAPILLARY
Glucose-Capillary: 174 mg/dL — ABNORMAL HIGH (ref 70–99)
Glucose-Capillary: 177 mg/dL — ABNORMAL HIGH (ref 70–99)
Glucose-Capillary: 160 mg/dL — ABNORMAL HIGH (ref 70–99)
Glucose-Capillary: 71 mg/dL (ref 70–99)
Glucose-Capillary: 131 mg/dL — ABNORMAL HIGH (ref 70–99)

## 2011-11-15 LAB — LIPASE, BLOOD: Lipase: 24 U/L (ref 11–59)

## 2011-11-15 IMAGING — RF DG CHOLANGIOGRAM OPERATIVE
1 series · 8 of 8 positions shown · non-contrast
Comparison: Nuclear medicine study [DATE].

CLINICAL DATA: Evaluate for common bile duct stone.
Cholecystectomy.

INTRAOPERATIVE CHOLANGIOGRAM
TECHNIQUE: Cholangiographic images from the C-arm fluoroscopic
device were submitted for interpretation post-operatively.  Please
see the procedural report for the amount of contrast and the
fluoroscopy time utilized.

[Series 1: run · 2 acquisitions, 8 frames shown]
[im 1/2]
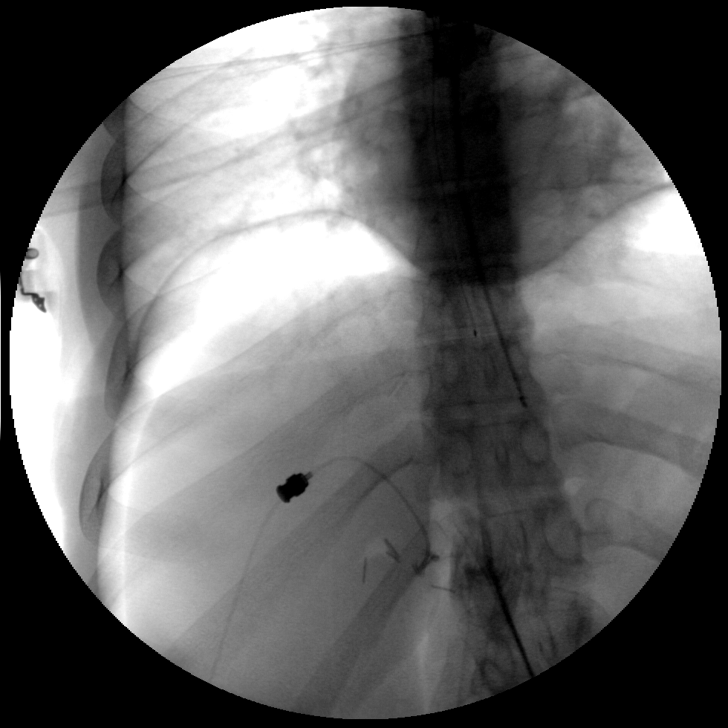
[im 1/2]
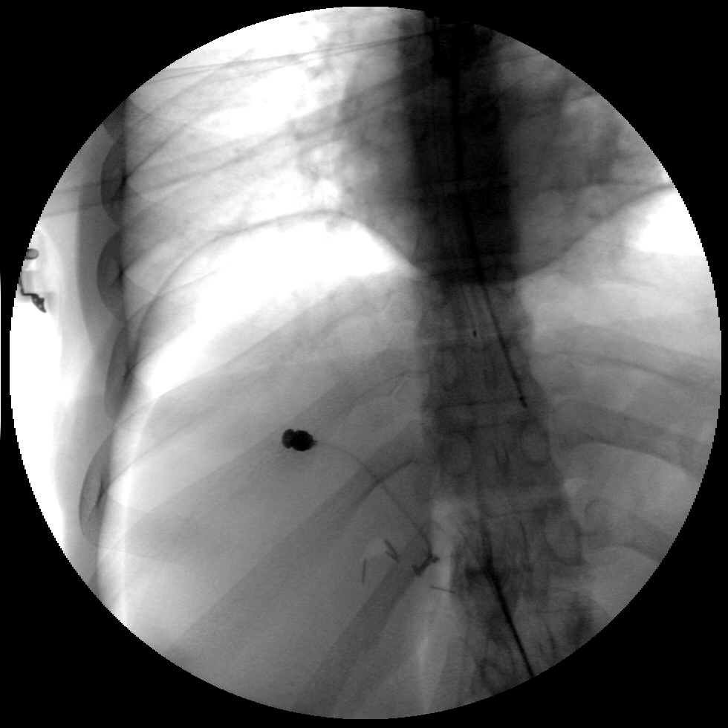
[im 1/2]
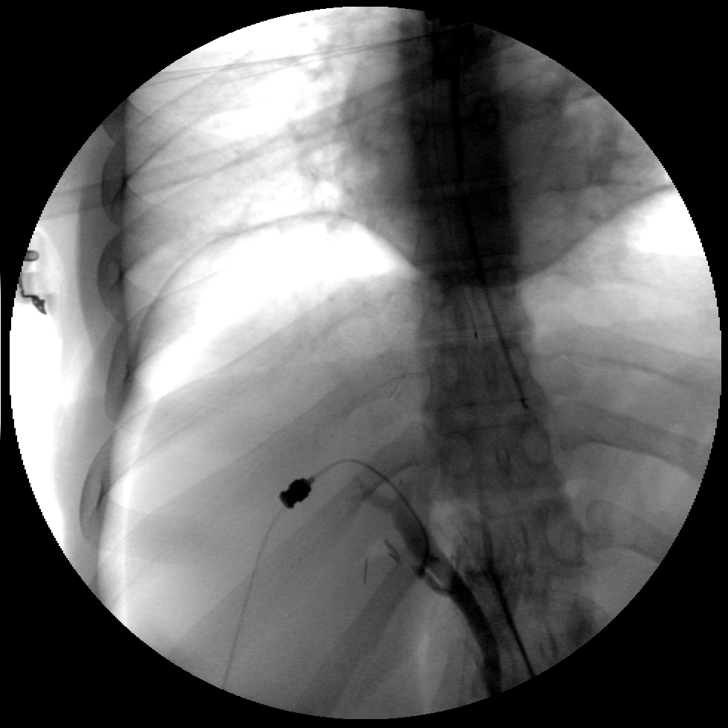
[im 1/2]
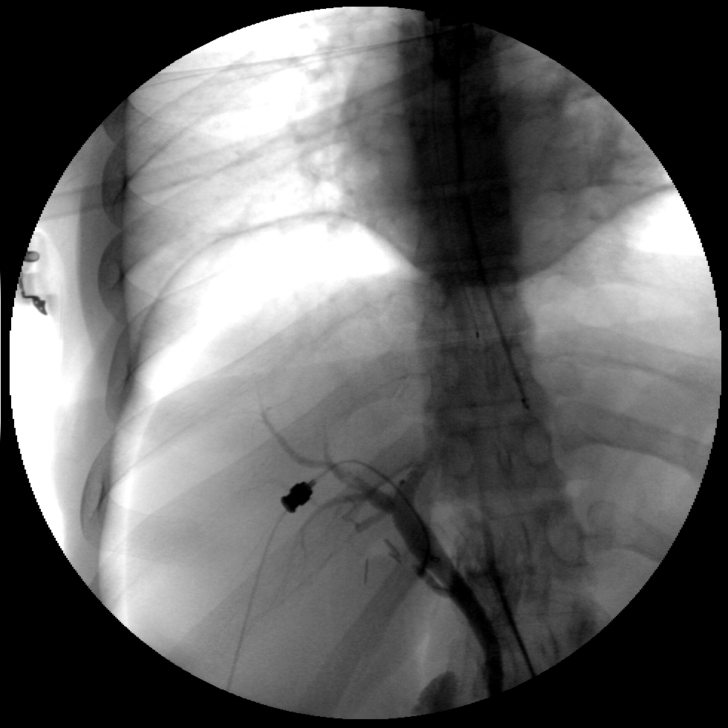
[im 2/2]
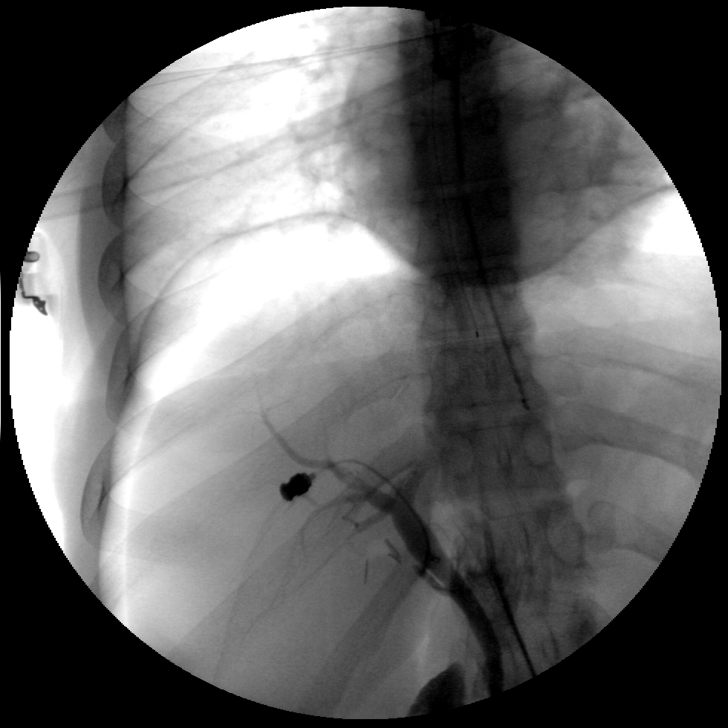
[im 2/2]
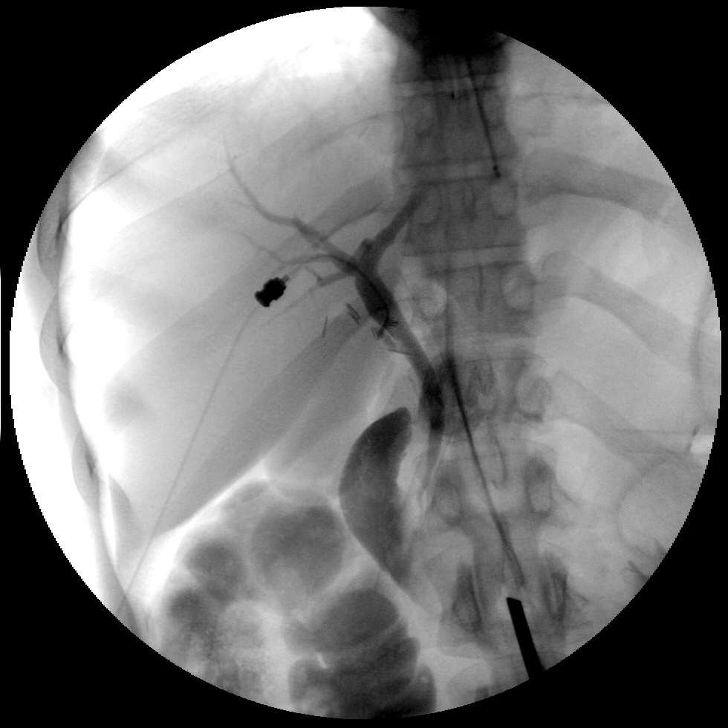
[im 2/2]
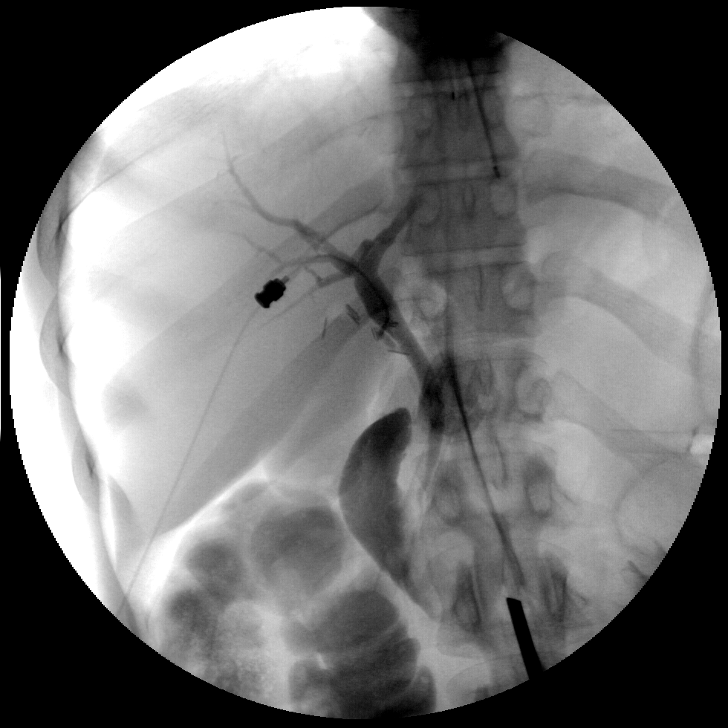
[im 2/2]
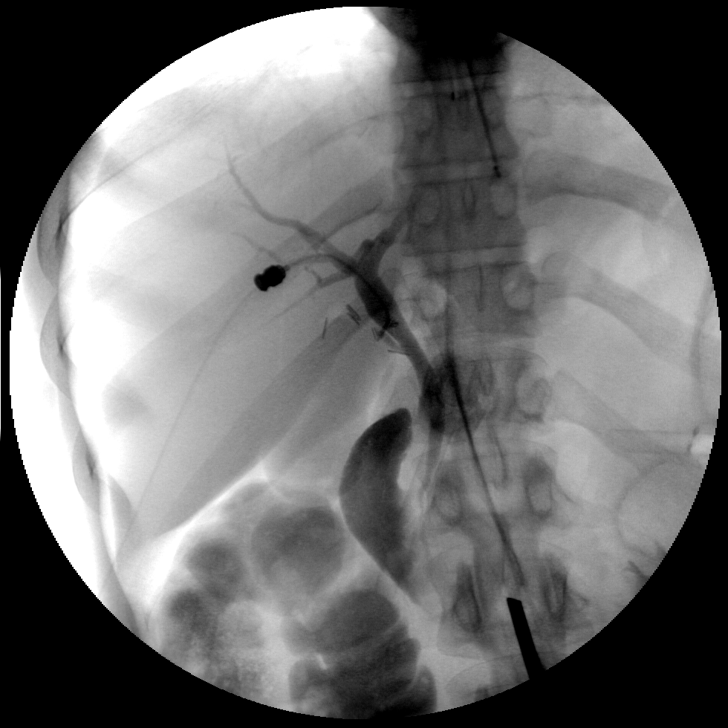

[8 of 8 positions shown; findings below may reference images not displayed]

FINDINGS: Normal caliber of the common duct, without persistent
filling defect to suggest choledocholithiasis.  No contrast
extravasation.  Free spill of contrast into the descending
duodenum.
IMPRESSION: No evidence of choledocholithiasis.

## 2011-11-15 SURGERY — LAPAROSCOPIC CHOLECYSTECTOMY WITH INTRAOPERATIVE CHOLANGIOGRAM
Anesthesia: General | Site: Abdomen | Wound class: Clean Contaminated

## 2011-11-15 MED ORDER — 0.9 % SODIUM CHLORIDE (POUR BTL) OPTIME
TOPICAL | Status: DC | PRN
  Administered 2011-11-15: 1000 mL

## 2011-11-15 MED ORDER — BUPIVACAINE-EPINEPHRINE 0.25% -1:200000 IJ SOLN
INTRAMUSCULAR | Status: DC | PRN
  Administered 2011-11-15: 30 mL

## 2011-11-15 MED ORDER — ACETAMINOPHEN 10 MG/ML IV SOLN
INTRAVENOUS | Status: DC | PRN
  Administered 2011-11-15: 1000 mg via INTRAVENOUS

## 2011-11-15 MED ORDER — SODIUM CHLORIDE 0.9 % IV SOLN
3.0000 g | Freq: Four times a day (QID) | INTRAVENOUS | Status: AC
  Filled 2011-11-15: qty 3

## 2011-11-15 MED ORDER — LACTATED RINGERS IV SOLN
INTRAVENOUS | Status: DC | PRN
  Administered 2011-11-15 (×2): via INTRAVENOUS

## 2011-11-15 MED ORDER — ONDANSETRON HCL 4 MG/2ML IJ SOLN
INTRAMUSCULAR | Status: DC | PRN
  Administered 2011-11-15: 4 mg via INTRAVENOUS

## 2011-11-15 MED ORDER — NEOSTIGMINE METHYLSULFATE 1 MG/ML IJ SOLN
INTRAMUSCULAR | Status: DC | PRN
  Administered 2011-11-15: 4 mg via INTRAVENOUS

## 2011-11-15 MED ORDER — NAPROXEN 500 MG PO TABS
500.0000 mg | ORAL_TABLET | Freq: Two times a day (BID) | ORAL | Status: DC
  Administered 2011-11-15 – 2011-11-16 (×2): 500 mg via ORAL
  Filled 2011-11-15 (×4): qty 1

## 2011-11-15 MED ORDER — HYDROMORPHONE HCL PF 1 MG/ML IJ SOLN: 0.2500 mg | INTRAMUSCULAR | Status: DC | PRN

## 2011-11-15 MED ORDER — KETOROLAC TROMETHAMINE 30 MG/ML IJ SOLN
INTRAMUSCULAR | Status: DC | PRN
  Administered 2011-11-15: 30 mg via INTRAVENOUS

## 2011-11-15 MED ORDER — PROMETHAZINE HCL 25 MG/ML IJ SOLN: 6.2500 mg | INTRAMUSCULAR | Status: DC | PRN

## 2011-11-15 MED ORDER — SODIUM CHLORIDE 0.9 % IJ SOLN: 3.0000 mL | INTRAMUSCULAR | Status: DC | PRN

## 2011-11-15 MED ORDER — OXYCODONE HCL 5 MG PO TABS: 5.0000 mg | ORAL_TABLET | Freq: Four times a day (QID) | ORAL | Status: DC | PRN

## 2011-11-15 MED ORDER — MIDAZOLAM HCL 5 MG/5ML IJ SOLN
INTRAMUSCULAR | Status: DC | PRN
  Administered 2011-11-15: 2 mg via INTRAVENOUS

## 2011-11-15 MED ORDER — LACTATED RINGERS IV SOLN: INTRAVENOUS | Status: DC

## 2011-11-15 MED ORDER — GLYCOPYRROLATE 0.2 MG/ML IJ SOLN
INTRAMUSCULAR | Status: DC | PRN
  Administered 2011-11-15: .4 mg via INTRAVENOUS

## 2011-11-15 MED ORDER — OXYCODONE HCL 5 MG PO TABS
5.0000 mg | ORAL_TABLET | ORAL | Status: DC | PRN
  Administered 2011-11-15: 5 mg via ORAL
  Administered 2011-11-16 – 2011-11-17 (×6): 10 mg via ORAL
  Filled 2011-11-15 (×3): qty 2
  Filled 2011-11-15: qty 1
  Filled 2011-11-15 (×4): qty 2

## 2011-11-15 MED ORDER — PROPOFOL 10 MG/ML IV EMUL
INTRAVENOUS | Status: DC | PRN
  Administered 2011-11-15: 125 mg via INTRAVENOUS

## 2011-11-15 MED ORDER — SODIUM CHLORIDE 0.9 % IJ SOLN
3.0000 mL | Freq: Two times a day (BID) | INTRAMUSCULAR | Status: DC
  Administered 2011-11-16 – 2011-11-17 (×2): 3 mL via INTRAVENOUS

## 2011-11-15 MED ORDER — IOHEXOL 300 MG/ML  SOLN
INTRAMUSCULAR | Status: DC | PRN
  Administered 2011-11-15: 50 mL

## 2011-11-15 MED ORDER — LACTATED RINGERS IR SOLN
Status: DC | PRN
  Administered 2011-11-15: 3000 mL

## 2011-11-15 MED ORDER — SODIUM CHLORIDE 0.9 % IV SOLN: 250.0000 mL | INTRAVENOUS | Status: DC | PRN

## 2011-11-15 MED ORDER — SUCCINYLCHOLINE CHLORIDE 20 MG/ML IJ SOLN
INTRAMUSCULAR | Status: DC | PRN
  Administered 2011-11-15: 100 mg via INTRAVENOUS

## 2011-11-15 MED ORDER — LIDOCAINE HCL (CARDIAC) 20 MG/ML IV SOLN
INTRAVENOUS | Status: DC | PRN
  Administered 2011-11-15: 100 mg via INTRAVENOUS

## 2011-11-15 MED ORDER — FENTANYL CITRATE 0.05 MG/ML IJ SOLN
INTRAMUSCULAR | Status: DC | PRN
  Administered 2011-11-15 (×5): 50 ug via INTRAVENOUS

## 2011-11-15 MED ORDER — ROCURONIUM BROMIDE 100 MG/10ML IV SOLN
INTRAVENOUS | Status: DC | PRN
  Administered 2011-11-15: 10 mg via INTRAVENOUS
  Administered 2011-11-15: 20 mg via INTRAVENOUS
  Administered 2011-11-15: 10 mg via INTRAVENOUS

## 2011-11-15 SURGICAL SUPPLY — 45 items
APPLIER CLIP 5 13 M/L LIGAMAX5 (MISCELLANEOUS) ×2
APPLIER CLIP ROT 10 11.4 M/L (STAPLE)
CABLE HIGH FREQUENCY MONO STRZ (ELECTRODE) IMPLANT
CANISTER SUCTION 2500CC (MISCELLANEOUS) ×2 IMPLANT
CLIP APPLIE 5 13 M/L LIGAMAX5 (MISCELLANEOUS) ×1 IMPLANT
CLIP APPLIE ROT 10 11.4 M/L (STAPLE) IMPLANT
CLOTH BEACON ORANGE TIMEOUT ST (SAFETY) ×2 IMPLANT
COVER MAYO STAND STRL (DRAPES) ×2 IMPLANT
DECANTER SPIKE VIAL GLASS SM (MISCELLANEOUS) ×2 IMPLANT
DRAPE C-ARM 42X72 X-RAY (DRAPES) ×2 IMPLANT
DRAPE LAPAROSCOPIC ABDOMINAL (DRAPES) ×2 IMPLANT
DRAPE WARM FLUID 44X44 (DRAPE) IMPLANT
DRSG TEGADERM 2-3/8X2-3/4 SM (GAUZE/BANDAGES/DRESSINGS) ×2 IMPLANT
DRSG TEGADERM 4X4.75 (GAUZE/BANDAGES/DRESSINGS) ×2 IMPLANT
ELECT REM PT RETURN 9FT ADLT (ELECTROSURGICAL) ×2
ELECTRODE REM PT RTRN 9FT ADLT (ELECTROSURGICAL) ×1 IMPLANT
ENDOLOOP SUT PDS II  0 18 (SUTURE)
ENDOLOOP SUT PDS II 0 18 (SUTURE) IMPLANT
GAUZE SPONGE 2X2 8PLY STRL LF (GAUZE/BANDAGES/DRESSINGS) ×1 IMPLANT
GLOVE ECLIPSE 8.0 STRL XLNG CF (GLOVE) ×2 IMPLANT
GLOVE INDICATOR 8.0 STRL GRN (GLOVE) ×2 IMPLANT
GOWN STRL NON-REIN LRG LVL3 (GOWN DISPOSABLE) ×2 IMPLANT
GOWN STRL REIN XL XLG (GOWN DISPOSABLE) ×4 IMPLANT
IV CATH 14GX2 1/4 (CATHETERS) ×2 IMPLANT
KIT BASIN OR (CUSTOM PROCEDURE TRAY) ×2 IMPLANT
NS IRRIG 1000ML POUR BTL (IV SOLUTION) ×2 IMPLANT
POUCH SPECIMEN RETRIEVAL 10MM (ENDOMECHANICALS) ×2 IMPLANT
SCALPEL HARMONIC ACE (MISCELLANEOUS) IMPLANT
SCISSORS LAP 5X35 DISP (ENDOMECHANICALS) ×2 IMPLANT
SET CHOLANGIOGRAPH MIX (MISCELLANEOUS) ×2 IMPLANT
SET IRRIG TUBING LAPAROSCOPIC (IRRIGATION / IRRIGATOR) ×2 IMPLANT
SLEEVE Z-THREAD 5X100MM (TROCAR) IMPLANT
SPONGE GAUZE 2X2 STER 10/PKG (GAUZE/BANDAGES/DRESSINGS) ×1
SUT MNCRL AB 4-0 PS2 18 (SUTURE) ×2 IMPLANT
SUT VICRYL 0 ENDOLOOP (SUTURE) IMPLANT
SUT VICRYL 0 TIES 12 18 (SUTURE) IMPLANT
SYR 20CC LL (SYRINGE) ×2 IMPLANT
TOWEL OR 17X26 10 PK STRL BLUE (TOWEL DISPOSABLE) ×2 IMPLANT
TRAY LAP CHOLE (CUSTOM PROCEDURE TRAY) ×2 IMPLANT
TROCAR BLADELESS OPT 5 75 (ENDOMECHANICALS) IMPLANT
TROCAR CANNULA W/PORT DUAL 5MM (MISCELLANEOUS) IMPLANT
TROCAR XCEL BLUNT TIP 100MML (ENDOMECHANICALS) ×2 IMPLANT
TROCAR Z-THREAD FIOS 11X100 BL (TROCAR) ×2 IMPLANT
TROCAR Z-THREAD FIOS 5X100MM (TROCAR) ×4 IMPLANT
TUBING INSUFFLATION 10FT LAP (TUBING) ×2 IMPLANT

## 2011-11-15 NOTE — Progress Notes (Signed)
[name] [id number] Jan 19, 1991  CARE TEAM:  PCP: [name], NP  Outpatient Care Team: Patient Care Team: [name] as PCP - General (Family Medicine)  Inpatient Treatment Team: Treatment Team: Attending Provider: Nolon Nations, MD; Technician: Allyne Gee, NT; Consulting Physician: Md Edison Pace, MD; Rounding Team: Redmond Baseman, MD; Technician: Patience Musca, NT  Subjective:  Sore, esp RUQ Medicines helping Significant other in room  Objective:  Vital signs:  Filed Vitals:   11/14/11 2139 11/15/11 0130 11/15/11 0505 11/15/11 0700  BP: 140/90 131/89 117/78 130/70  Pulse: 99 97 90 103  Temp: 97.8 F (36.6 C) 97.6 F (36.4 C) 97.6 F (36.4 C) 98 F (36.7 C)  TempSrc: Oral Oral Oral Oral  Resp: 16 14 16 18   Height:      Weight:      SpO2: 100% 98% 99% 100%       Intake/Output   Yesterday:  08/16 0701 - 08/17 0700 In: -  Out: 300 [Urine:300] This shift:     Bowel function:  Flatus: y  BM: n  Physical Exam:  General: Pt awakens, oriented x4 in no acute distress.  Mildly groggy @ first but then well alert  Eyes: PERRL, normal EOM.  Sclera clear.  No icterus Neuro: CN II-XII intact w/o focal sensory/motor deficits. Lymph: No head/neck/groin lymphadenopathy Psych:  No delerium/psychosis/paranoia HENT: Normocephalic, Mucus membranes moist.  No thrush Neck: Supple, No tracheal deviation Chest: No chest wall pain w good excursion CV:  Pulses intact.  Regular rhythm Abdomen: Soft.  Nondistended.  Mod tender RUQ w Murphy's sign.  No incarcerated hernias. Ext:  SCDs BLE.  No mjr edema.  No cyanosis Skin: No petechiae / purpurae  Problem List:  Active Problems:  Diabetes type 1, uncontrolled  Hypertension associated with diabetes  Chronic cholecystitis  Gastroparesis   Assessment  [name]  21 y.o. female     Procedure(s): LAPAROSCOPIC CHOLECYSTECTOMY SINGLE PORT  Acalc cholecystitis  HTN better controlled DM better  controlled  Plan:  Lap chole:  The anatomy & physiology of hepatobiliary & pancreatic function was discussed.  The pathophysiology of gallbladder dysfunction was discussed.  Natural history risks without surgery was discussed.   I feel the risks of no intervention will lead to serious problems that outweigh the operative risks; therefore, I recommended cholecystectomy to remove the pathology.  I explained laparoscopic techniques with possible need for an open approach.  Probable cholangiogram to evaluate the bilary tract was explained as well.    Risks such as bleeding, infection, abscess, leak, injury to other organs, need for further treatment, heart attack, death, and other risks were discussed.  I noted a good likelihood this will help address the problem.  Possibility that this will not correct all abdominal symptoms was explained.  Goals of post-operative recovery were discussed as well.  We will work to minimize complications.  An educational handout further explaining the pathology and treatment options was given as well.  Questions were answered.  The patient expresses understanding & wishes to proceed with surgery.    -VTE prophylaxis- SCDs, etc -mobilize as tolerated to help recovery  [name], M.D., F.A.C.S. Gastrointestinal and Minimally Invasive Surgery [facility] [address] [place], [place] [address] [phone] Main / Paging [phone] Voice Mail   11/15/2011  Results:   Labs: Results for orders placed during the hospital encounter of 11/14/11 (from the past 48 hour(s))  GLUCOSE, CAPILLARY     Status:  Abnormal   Collection Time   11/14/11  3:46 PM      Component Value Range Comment   Glucose-Capillary 288 (*) 70 - 99 mg/dL    Comment 1 Notify RN     COMPREHENSIVE METABOLIC PANEL     Status: Abnormal   Collection Time   11/14/11  4:05 PM      Component Value Range Comment   Sodium 132 (*) 135 - 145 mEq/L     Potassium 3.6  3.5 - 5.1 mEq/L    Chloride 91 (*) 96 - 112 mEq/L    CO2 27  19 - 32 mEq/L    Glucose, Bld 298 (*) 70 - 99 mg/dL    BUN 6  6 - 23 mg/dL    Creatinine, Ser 0.46 (*) 0.50 - 1.10 mg/dL    Calcium 9.9  8.4 - 10.5 mg/dL    Total Protein 8.0  6.0 - 8.3 g/dL    Albumin 4.5  3.5 - 5.2 g/dL    AST 11  0 - 37 U/L    ALT 10  0 - 35 U/L    Alkaline Phosphatase 62  39 - 117 U/L    Total Bilirubin 0.6  0.3 - 1.2 mg/dL    GFR calc non Af Amer >90  >90 mL/min    GFR calc Af Amer >90  >90 mL/min   MAGNESIUM     Status: Normal   Collection Time   11/14/11  4:05 PM      Component Value Range Comment   Magnesium 1.8  1.5 - 2.5 mg/dL   APTT     Status: Normal   Collection Time   11/14/11  4:05 PM      Component Value Range Comment   aPTT 30  24 - 37 seconds   PROTIME-INR     Status: Normal   Collection Time   11/14/11  4:05 PM      Component Value Range Comment   Prothrombin Time 14.8  11.6 - 15.2 seconds    INR 1.14  0.00 - 1.49   PRO B NATRIURETIC PEPTIDE     Status: Normal   Collection Time   11/14/11  4:05 PM      Component Value Range Comment   Pro B Natriuretic peptide (BNP) 20.0  0 - 125 pg/mL   CBC     Status: Abnormal   Collection Time   11/14/11  4:05 PM      Component Value Range Comment   WBC 3.7 (*) 4.0 - 10.5 K/uL    RBC 4.88  3.87 - 5.11 MIL/uL    Hemoglobin 15.3 (*) 12.0 - 15.0 g/dL    HCT 41.4  36.0 - 46.0 %    MCV 84.8  78.0 - 100.0 fL    MCH 31.4  26.0 - 34.0 pg    MCHC 37.0 (*) 30.0 - 36.0 g/dL    RDW 11.6  11.5 - 15.5 %    Platelets 287  150 - 400 K/uL   LACTIC ACID, PLASMA     Status: Normal   Collection Time   11/14/11  5:25 PM      Component Value Range Comment   Lactic Acid, Venous 1.2  0.5 - 2.2 mmol/L   HEMOGLOBIN A1C     Status: Abnormal   Collection Time   11/14/11  5:25 PM      Component Value Range Comment   Hemoglobin A1C 6.6 (*) <5.7 %    Mean  Plasma Glucose 143 (*) <117 mg/dL   GLUCOSE, CAPILLARY     Status: Abnormal   Collection Time    11/14/11  5:39 PM      Component Value Range Comment   Glucose-Capillary 257 (*) 70 - 99 mg/dL   URINALYSIS, ROUTINE W REFLEX MICROSCOPIC     Status: Abnormal   Collection Time   11/14/11  5:45 PM      Component Value Range Comment   Color, Urine YELLOW  YELLOW    APPearance CLOUDY (*) CLEAR    Specific Gravity, Urine 1.018  1.005 - 1.030    pH 6.0  5.0 - 8.0    Glucose, UA 500 (*) NEGATIVE mg/dL    Hgb urine dipstick NEGATIVE  NEGATIVE    Bilirubin Urine MODERATE (*) NEGATIVE    Ketones, ur >80 (*) NEGATIVE mg/dL    Protein, ur 30 (*) NEGATIVE mg/dL    Urobilinogen, UA 1.0  0.0 - 1.0 mg/dL    Nitrite NEGATIVE  NEGATIVE    Leukocytes, UA NEGATIVE  NEGATIVE   URINE MICROSCOPIC-ADD ON     Status: Normal   Collection Time   11/14/11  5:45 PM      Component Value Range Comment   Squamous Epithelial / LPF RARE  RARE    WBC, UA 3-6  <3 WBC/hpf    Urine-Other MUCOUS PRESENT   AMORPHOUS URATES/PHOSPHATES  GLUCOSE, CAPILLARY     Status: Abnormal   Collection Time   11/14/11 10:03 PM      Component Value Range Comment   Glucose-Capillary 162 (*) 70 - 99 mg/dL   CBC     Status: Abnormal   Collection Time   11/15/11  4:11 AM      Component Value Range Comment   WBC 4.1  4.0 - 10.5 K/uL    RBC 4.09  3.87 - 5.11 MIL/uL    Hemoglobin 12.7  12.0 - 15.0 g/dL    HCT 34.4 (*) 36.0 - 46.0 %    MCV 84.1  78.0 - 100.0 fL    MCH 31.1  26.0 - 34.0 pg    MCHC 36.9 (*) 30.0 - 36.0 g/dL    RDW 11.5  11.5 - 15.5 %    Platelets 227  150 - 400 K/uL   COMPREHENSIVE METABOLIC PANEL     Status: Abnormal   Collection Time   11/15/11  4:11 AM      Component Value Range Comment   Sodium 134 (*) 135 - 145 mEq/L    Potassium 3.6  3.5 - 5.1 mEq/L    Chloride 97  96 - 112 mEq/L    CO2 28  19 - 32 mEq/L    Glucose, Bld 135 (*) 70 - 99 mg/dL    BUN 6  6 - 23 mg/dL    Creatinine, Ser 0.41 (*) 0.50 - 1.10 mg/dL    Calcium 9.1  8.4 - 10.5 mg/dL    Total Protein 6.4  6.0 - 8.3 g/dL    Albumin 3.5  3.5 - 5.2  g/dL    AST 11  0 - 37 U/L    ALT 8  0 - 35 U/L    Alkaline Phosphatase 46  39 - 117 U/L    Total Bilirubin 0.6  0.3 - 1.2 mg/dL    GFR calc non Af Amer >90  >90 mL/min    GFR calc Af Amer >90  >90 mL/min   LIPASE, BLOOD     Status: Normal  Collection Time   11/15/11  4:11 AM      Component Value Range Comment   Lipase 24  11 - 59 U/L     Imaging / Studies: Nm Hepatobiliary  11/14/2011  *RADIOLOGY REPORT*  Clinical Data:  NUCLEAR MEDICINE HEPATOBILIARY IMAGING  Technique:  Sequential images of the abdomen were obtained out to 90 minutes following intravenous administration of radiopharmaceutical. Due to nonvisualization of the gallbladder, the patient was injected with an additional 1.0 mCi technetium 81mCholetec and slow intravenous infusion of 2.2 mg morphine was administered, while sequential images were continued for another 30 minutes.  Radiopharmaceutical:  5.080m [id number]  Comparison:  None.  Findings: There is prompt radiopharmaceutical uptake by the liver. Liver is normal appearance.  There is prompt biliary excretion activity, with activity initially seen within the duodenum at 15 minutes.  Reflux of biliary activity into the stomach is seen which showed progression over 90 minutes.  No gallbladder activity is identified either before or following intravenous infusion of morphine.  IMPRESSION:  1.  Nonvisualization of gallbladder, consistent with cystic duct obstruction. 2.  Prominent bile reflux into the stomach. 3.  No evidence of biliary obstruction.  Original Report Authenticated By: JOHMarlaine Hind.D.    Medications / Allergies: per chart  Antibiotics: Anti-infectives     Start     Dose/Rate Route Frequency Ordered Stop   11/14/11 1800   ciprofloxacin (CIPRO) IVPB 400 mg        400 mg 200 mL/hr over 60 Minutes Intravenous Every 12 hours 11/14/11 1712     11/14/11 1800   Ampicillin-Sulbactam (UNASYN) 3 g in sodium chloride 0.9 % 100 mL IVPB        3 g 100 mL/hr  over 60 Minutes Intravenous Every 6 hours 11/14/11 1757

## 2011-11-15 NOTE — Consult Note (Addendum)
[facility] Medical Consultation  [name]:[id number] DOB: 1991/03/25 DOA: 11/14/2011 PCP: [name], NP   Requesting physician: Dr. [name] (surgery) Reason for consultation: Diabetes, HTN  Impression/Recommendations  Active Problems:  Diabetes type 1, controlled - A1c 6.6 indicating relatively good glucose control - CBG 177, 174, 162 - for now continue sliding scale, hold metformin   Hypertension  - BP 130/70 - at goal - continue lisinopril   Chronic cholecystitis - per surgery   Gastroparesis - continue reglan   I will followup again tomorrow. Please contact me if I can be of assistance in the meanwhile. Thank you for this consultation.   Past Medical History  Diagnosis Date  . Diabetes mellitus   . Hypertension   . Diabetes type 1, uncontrolled 11/14/2011    Since age 59   Past Surgical History  Procedure Date  . Ankle surgery    Social History:  reports that she has never smoked. She has never used smokeless tobacco. She reports that she does not drink alcohol or use illicit drugs.  No Known Allergies History reviewed. No pertinent family history.  Prior to Admission medications   Medication Sig Start Date End Date Taking? Authorizing Provider  aspirin 325 MG tablet Take 325 mg by mouth daily.   Yes Historical Provider, MD  enalapril (VASOTEC) 10 MG tablet Take 10 mg by mouth every evening.    Yes Historical Provider, MD  HYDROcodone-acetaminophen (NORCO/VICODIN) 5-325 MG per tablet Take 1 tablet by mouth every 8 (eight) hours as needed. For pain 11/07/11 11/17/11 Yes [name], MD  insulin aspart (NOVOLOG) 100 UNIT/ML injection Inject 0-15 Units into the skin 3 (three) times daily before meals. 5 u with each meal + sliding scale: CBG 70 - 120: 0 u: CBG 121 - 150: 2 u; CBG 151 - 200: 3 u; CBG 201 - 250: 5 u; CBG 251 - 300: 8 u;CBG 301 - 350: 11 u; CBG 351 - 400: 15 u; CBG > 400 Call MD 09/20/11 09/19/12 Yes [name], MD  insulin glargine  (LANTUS) 100 UNIT/ML injection Inject 30 Units into the skin at bedtime.  09/20/11  Yes [name], MD  metFORMIN (GLUCOPHAGE) 500 MG tablet Take 500 mg by mouth 2 (two) times daily with a meal.   Yes Historical Provider, MD  metoCLOPramide (REGLAN) 10 MG tablet Take 10 mg by mouth 4 (four) times daily.   Yes Historical Provider, MD  Multiple Vitamin (MULTIVITAMIN WITH MINERALS) TABS Take 1 tablet by mouth daily.   Yes Historical Provider, MD  omeprazole (PRILOSEC) 20 MG capsule Take 20 mg by mouth daily.   Yes Historical Provider, MD  ondansetron (ZOFRAN) 4 MG tablet Take 4 mg by mouth every 6 (six) hours. For nausea 11/07/11 11/14/11 Yes [name], MD  sucralfate (CARAFATE) 1 G tablet Take 1 g by mouth 4 (four) times daily.   Yes Historical Provider, MD  oxyCODONE (OXY IR/ROXICODONE) 5 MG immediate release tablet Take 1-2 tablets (5-10 mg total) by mouth every 6 (six) hours as needed for pain. 11/15/11 11/25/11  [name], MD   Physical Exam: Blood pressure 130/70, pulse 103, temperature 98 F (36.7 C), temperature source Oral, resp. rate 18, height 5\' 7"  (1.702 m), weight 52.164 kg (115 lb), last menstrual period 10/16/2011, SpO2 100.00%. Filed Vitals:   11/15/11 0130 11/15/11 0505 11/15/11 0700 11/15/11 0952  BP: 131/89 117/78 130/70   Pulse: 97 90 103   Temp: 97.6 F (36.4 C) 97.6 F (  36.4 C) 98 F (36.7 C) 98 F (36.7 C)  TempSrc: Oral Oral Oral   Resp: 14 16 18    Height:      Weight:      SpO2: 98% 99% 100%      General:  No acute distress, alert and oriented x 3  Eyes: PERRLA, EOMI  ENT: no tonsillar erythema or exudates  Neck:supple, no lymphadenopathy  Cardiovascular: S1, S2, regular rate and rhythm  Respiratory: clear to auscultation bilaterally, no wheezing  Abdomen: (+) BS, tenderness across mid abdomen with no rebound tenderness or guarding  Skin: warm, dry  Musculoskeletal: normal range of motion, no erythema, no joint effusion  Psychiatric:  normal mood and affect  Neurologic: AAO x 3, no focal neurologic deficits  Labs on Admission:  Basic Metabolic Panel:  Lab 11/15/11 1610 11/14/11 1605 11/12/11 1455 11/11/11 1610 11/11/11 1535  NA 134* 132* 133* 135 135  K 3.6 3.6 4.0 3.9 4.0  CL 97 91* 92* 98 95*  CO2 28 27 22 25 29   GLUCOSE 135* 298* 212* 189* 220*  BUN 6 6 6 7 7   CREATININE 0.41* 0.46* 0.45* 0.53 0.56  CALCIUM 9.1 9.9 9.9 9.3 10.2  MG -- 1.8 -- -- --  PHOS -- -- -- -- --   Liver Function Tests:  Lab 11/15/11 0411 11/14/11 1605 11/12/11 1455 11/11/11 1610  AST 11 11 13 16   ALT 8 10 13 15   ALKPHOS 46 62 61 56  BILITOT 0.6 0.6 0.9 0.7  PROT 6.4 8.0 8.0 7.2  ALBUMIN 3.5 4.5 4.6 4.0    Lab 11/15/11 0411 11/11/11 1610  LIPASE 24 22  AMYLASE -- --   No results found for this basename: AMMONIA:5 in the last 168 hours CBC:  Lab 11/15/11 0411 11/14/11 1605 11/12/11 1455 11/11/11 1535  WBC 4.1 3.7* 2.7* 4.2  HGB 12.7 15.3* 14.8 15.3*  HCT 34.4* 41.4 40.1 41.4  MCV 84.1 84.8 84.1 84.1  PLT 227 287 254 298   CBG:  Lab 11/15/11 1013 11/15/11 0742 11/14/11 2203 11/14/11 1739 11/14/11 1546  GLUCAP 177* 174* 162* 257* 288*    Radiological Exams on Admission: Dg Cholangiogram Operative 11/15/2011  * IMPRESSION: No evidence of choledocholithiasis.    Nm Hepatobiliary 11/14/2011  * IMPRESSION:  1.  Nonvisualization of gallbladder, consistent with cystic duct obstruction. 2.  Prominent bile reflux into the stomach. 3.  No evidence of biliary obstruction.     Manson Passey [facility] Pager [phone]  If 7PM-7AM, please contact night-coverage [email or web address] Password [facility] 11/15/2011, 10:32 AM

## 2011-11-15 NOTE — Op Note (Addendum)
11/14/2011 - 11/15/2011  2:97 AM  PATIENT:  [name]  21 y.o. female  Patient Care Team: [name] as PCP - General (Family Medicine)  PRE-OPERATIVE DIAGNOSIS:  Chronic acalculous cholecystitis  POST-OPERATIVE DIAGNOSIS:  Chronic acalculous cholecystitis  PROCEDURE:  Procedure(s): LAPAROSCOPIC CHOLECYSTECTOMY WITH INTRAOPERATIVE CHOLANGIOGRAM  SURGEON:    [name], MD  ASSISTANT:   Shann Medal, MD   ANESTHESIA:   local and general  EBL:  Total I/O In: 1000 [I.V.:1000] Out: 25 [Blood:25]  Delay start of Pharmacological VTE agent (>24hrs) due to surgical blood loss or risk of bleeding:  no  DRAINS: none   SPECIMEN:  No Specimen and Source of Specimen:  Gallbladder  DISPOSITION OF SPECIMEN:  PATHOLOGY  COUNTS:  YES  PLAN OF CARE: Admit for overnight observation  PATIENT DISPOSITION:  PACU - hemodynamically stable.  INDICATION: Young insulin requiring type I diabetic.  Worsening abdominal pain and nausea and vomiting.  History of chronic gastroparesis that appears to be stable.  HIDA scan concerning for cholecystitis.  Differential diagnosis seems less likely.  My partner and I recommended consideration of cholecystectomy:  The anatomy & physiology of hepatobiliary & pancreatic function was discussed.  The pathophysiology of gallbladder dysfunction was discussed.  Natural history risks without surgery was discussed.   I feel the risks of no intervention will lead to serious problems that outweigh the operative risks; therefore, I recommended cholecystectomy to remove the pathology.  I explained laparoscopic techniques with possible need for an open approach.  Probable cholangiogram to evaluate the bilary tract was explained as well.    Risks such as bleeding, infection, abscess, leak, injury to other organs, need for further treatment, heart attack, death, and other risks were discussed.  I noted a good likelihood this will help address the problem.   Possibility that this will not correct all abdominal symptoms was explained.  Goals of post-operative recovery were discussed as well.  We will work to minimize complications.  An educational handout further explaining the pathology and treatment options was given as well.  Questions were answered.  The patient expresses understanding & wishes to proceed with surgery.  OR FINDINGS: Distended dilated and boggy gallbladder.  Some omental adhesions.  Not strongly suspicious for chronic cholecystitis with perhaps an acute component as well.  Cholangiogram showing classic biliary anatomy.  Thickened sludge but no definite gallstones  DESCRIPTION:   The patient was identified & brought into the operating room. The patient was positioned supine with arms tucked. SCDs were active during the entire case. The patient underwent general anesthesia without any difficulty.  The abdomen was prepped and draped in a sterile fashion. A Surgical Timeout confirmed our plan.  We positioned the patient in reverse [place] & right side up.  I placed a 36m laparoscopic port through the abdominal wall using Modified Hassan cutdown technique through a supraumbilical incision.  Entry was clean.  We induced carbon dioxide insufflation. Camera inspection revealed no injury. There were no adhesions to the anterior abdominal wall supraumbilically.  I proceeded to continue with laparoscopic technique. I placed a #5 port in supraumbilical region, another 546mport in the right flank near the anterior axillary line, and a 65m48mort in the left upper quadrant region obliquely within the falciform ligament.  I excised the umbilical port to a 10 mm port  I turned attention to the right upper quadrant.  There is some moderate mesial colon and greater omental adhesions that I was able to isolated  and freed with hook cautery and blunt dissection  The gallbladder fundus was elevated cephalad. I used hook cautery to free the peritoneal  coverings between the gallbladder and the liver on the posteriolateral and anteriomedial walls.   I used careful blunt and hook dissection to help get a good critical view of the cystic artery and cystic duct. I did further dissection to free a few centimeters of the  gallbladder off the liver bed to get a good critical view of the infundibulum and cystic duct. I mobilized the cystic artery; and, after getting a good 360 view, ligated the cystic artery using clips. I skeletonized the cystic duct.  I placed a clip on the infundibulum. I did a partial cystic duct-otomy and ensured patency. I placed a 5 [place] cholangiocatheter through a puncture site at the right subcostal ridge of the abdominal wall and directed it into the cystic duct.  We ran a cholangiogram with dilute radio-opaque contrast and continuous fluoroscopy. Contrast flowed from a side branch consistent with cystic duct cannulization. Contrast flowed up the common hepatic duct into the right and left intrahepatic chains out to secondary radicals. Contrast flowed down the common bile duct easily across the normal ampulla into the duodenum.  This was consistent with a normal cholangiogram.  I removed the cholangiocatheter. I placed clips on the cystic duct x4.  I completed cystic duct transection. I freed the gallbladder from its remaining attachments to the liver. I ensured hemostasis on the gallbladder fossa of the liver and elsewhere. I inspected the rest of the abdomen & detected no injury nor bleeding elsewhere.  I removed the gallbladder Inside an Endo Catch bag since I had a needle puncture of the gallbladder was on 2 mL of thick sludge bile spill on the liver.  We did copious irrigation of saline in the upper abdomen and pelvis.  Hemostasis was excellent   I closed the subxiphoid fascia transversely using 0 Vicryl interrupted stitches  A closed the skin using 4-0 monocryl stitch.  Sterile dressings were applied. The patient was extubated &  arrived in the PACU in stable condition..  I had discussed postoperative care with the patient In her room just prior to surgery and the presence of her family.   I discussed the patient's status to the pt's husband.  Questions were answered.  They expressed understanding & appreciation.  Instructions are written in the chart as well.

## 2011-11-15 NOTE — Anesthesia Postprocedure Evaluation (Signed)
  Anesthesia Post-op Note  Patient: [name]  Procedure(s) Performed: Procedure(s) (LRB): LAPAROSCOPIC CHOLECYSTECTOMY WITH INTRAOPERATIVE CHOLANGIOGRAM (N/A)  Patient Location: PACU  Anesthesia Type: General  Level of Consciousness: awake and alert   Airway and Oxygen Therapy: Patient Spontanous Breathing  Post-op Pain: mild  Post-op Assessment: Post-op Vital signs reviewed, Patient's Cardiovascular Status Stable, Respiratory Function Stable, Patent Airway and No signs of Nausea or vomiting  Post-op Vital Signs: stable  Complications: No apparent anesthesia complications

## 2011-11-15 NOTE — Anesthesia Preprocedure Evaluation (Signed)
Anesthesia Evaluation  Patient identified by MRN, date of birth, ID band Patient awake    Reviewed: Allergy & Precautions, H&P , NPO status , Patient's Chart, lab work & pertinent test results  Airway Mallampati: II TM Distance: >3 FB Neck ROM: Full    Dental No notable dental hx.    Pulmonary neg pulmonary ROS,  breath sounds clear to auscultation  Pulmonary exam normal       Cardiovascular hypertension, Pt. on home beta blockers and Pt. on medications Rhythm:Regular Rate:Normal     Neuro/Psych negative neurological ROS  negative psych ROS   GI/Hepatic negative GI ROS, Neg liver ROS,   Endo/Other  Poorly Controlled, Type 2, Oral Hypoglycemic Agents and Insulin DependentH/O DKA 09/17/11  Renal/GU negative Renal ROS  negative genitourinary   Musculoskeletal negative musculoskeletal ROS (+)   Abdominal   Peds negative pediatric ROS (+)  Hematology negative hematology ROS (+)   Anesthesia Other Findings   Reproductive/Obstetrics negative OB ROS                           Anesthesia Physical Anesthesia Plan  ASA: III  Anesthesia Plan: General   Post-op Pain Management:    Induction: Intravenous  Airway Management Planned: Oral ETT  Additional Equipment:   Intra-op Plan:   Post-operative Plan: Extubation in OR  Informed Consent: I have reviewed the patients History and Physical, chart, labs and discussed the procedure including the risks, benefits and alternatives for the proposed anesthesia with the patient or authorized representative who has indicated his/her understanding and acceptance.   Dental advisory given  Plan Discussed with: CRNA  Anesthesia Plan Comments:         Anesthesia Quick Evaluation

## 2011-11-15 NOTE — Transfer of Care (Signed)
Immediate Anesthesia Transfer of Care Note  Patient: [name]  Procedure(s) Performed: Procedure(s) (LRB): LAPAROSCOPIC CHOLECYSTECTOMY WITH INTRAOPERATIVE CHOLANGIOGRAM (N/A)  Patient Location: PACU  Anesthesia Type: General  Level of Consciousness: awake and sedated  Airway & Oxygen Therapy: Patient Spontanous Breathing and Patient connected to face mask oxygen  Post-op Assessment: Report given to PACU RN and Post -op Vital signs reviewed and stable  Post vital signs: Reviewed and stable  Complications: No apparent anesthesia complications

## 2011-11-15 NOTE — Preoperative (Signed)
Beta Blockers   Reason not to administer Beta Blockers:Not Applicable 

## 2011-11-16 LAB — GLUCOSE, CAPILLARY
Glucose-Capillary: 65 mg/dL — ABNORMAL LOW (ref 70–99)
Glucose-Capillary: 76 mg/dL (ref 70–99)
Glucose-Capillary: 130 mg/dL — ABNORMAL HIGH (ref 70–99)
Glucose-Capillary: 182 mg/dL — ABNORMAL HIGH (ref 70–99)
Glucose-Capillary: 114 mg/dL — ABNORMAL HIGH (ref 70–99)

## 2011-11-16 MED ORDER — MAGNESIUM HYDROXIDE 400 MG/5ML PO SUSP: 30.0000 mL | Freq: Two times a day (BID) | ORAL | Status: DC | PRN

## 2011-11-16 MED ORDER — DEXTROSE 50 % IV SOLN
25.0000 mL | Freq: Once | INTRAVENOUS | Status: AC | PRN
  Filled 2011-11-16: qty 50

## 2011-11-16 MED ORDER — BOOST / RESOURCE BREEZE PO LIQD
1.0000 | Freq: Two times a day (BID) | ORAL | Status: DC
  Administered 2011-11-16 – 2011-11-17 (×2): 1 via ORAL

## 2011-11-16 MED ORDER — DEXTROSE 5 % IV SOLN: INTRAVENOUS | Status: DC

## 2011-11-16 MED ORDER — ACETAMINOPHEN 500 MG PO TABS
1000.0000 mg | ORAL_TABLET | Freq: Three times a day (TID) | ORAL | Status: DC
  Administered 2011-11-16 – 2011-11-17 (×4): 1000 mg via ORAL
  Filled 2011-11-16 (×6): qty 2

## 2011-11-16 MED ORDER — GLUCAGON HCL (RDNA) 1 MG IJ SOLR: 1.0000 mg | Freq: Once | INTRAMUSCULAR | Status: AC | PRN

## 2011-11-16 MED ORDER — GLUCAGON HCL (RDNA) 1 MG IJ SOLR: 0.5000 mg | Freq: Once | INTRAMUSCULAR | Status: AC | PRN

## 2011-11-16 MED ORDER — INSULIN GLARGINE 100 UNIT/ML ~~LOC~~ SOLN
10.0000 [IU] | Freq: Every day | SUBCUTANEOUS | Status: DC
  Administered 2011-11-16: 10 [IU] via SUBCUTANEOUS

## 2011-11-16 MED ORDER — PSYLLIUM 95 % PO PACK
1.0000 | PACK | Freq: Two times a day (BID) | ORAL | Status: DC
  Administered 2011-11-16 – 2011-11-17 (×3): 1 via ORAL
  Filled 2011-11-16 (×4): qty 1

## 2011-11-16 MED ORDER — PROMETHAZINE HCL 25 MG RE SUPP: 25.0000 mg | Freq: Four times a day (QID) | RECTAL | Status: DC | PRN

## 2011-11-16 MED ORDER — INSULIN GLARGINE 100 UNIT/ML ~~LOC~~ SOLN: 10.0000 [IU] | Freq: Every day | SUBCUTANEOUS | Status: DC

## 2011-11-16 MED ORDER — PROMETHAZINE HCL 25 MG/ML IJ SOLN: 12.5000 mg | Freq: Four times a day (QID) | INTRAMUSCULAR | Status: DC | PRN

## 2011-11-16 MED ORDER — GLUCOSE-VITAMIN C 4-6 GM-MG PO CHEW: 4.0000 | CHEWABLE_TABLET | ORAL | Status: DC | PRN

## 2011-11-16 MED ORDER — HYDROMORPHONE HCL PF 1 MG/ML IJ SOLN
1.0000 mg | INTRAMUSCULAR | Status: DC | PRN
  Administered 2011-11-16: 1 mg via INTRAVENOUS
  Filled 2011-11-16: qty 1

## 2011-11-16 MED ORDER — GLUCOSE 40 % PO GEL: 1.0000 | ORAL | Status: DC | PRN

## 2011-11-16 MED ORDER — BISACODYL 10 MG RE SUPP: 10.0000 mg | Freq: Two times a day (BID) | RECTAL | Status: DC | PRN

## 2011-11-16 MED ORDER — SACCHAROMYCES BOULARDII 250 MG PO CAPS
250.0000 mg | ORAL_CAPSULE | Freq: Two times a day (BID) | ORAL | Status: DC
  Administered 2011-11-16 – 2011-11-17 (×3): 250 mg via ORAL
  Filled 2011-11-16 (×4): qty 1

## 2011-11-16 MED ORDER — PROMETHAZINE HCL 25 MG PO TABS: 12.5000 mg | ORAL_TABLET | Freq: Four times a day (QID) | ORAL | Status: DC | PRN

## 2011-11-16 NOTE — Progress Notes (Signed)
INITIAL ADULT NUTRITION ASSESSMENT Date: 11/16/2011   Time: 12:59 PM Reason for Assessment: Nutrition Risk- Unintentional weight loss  ASSESSMENT: Female 21 y.o.  Dx: Diabetes type 1, uncontrolled; chronic cholecystitis; gastroparesis  Past Medical History  Diagnosis Date  . Diabetes mellitus   . Hypertension   . Diabetes type 1, uncontrolled 11/14/2011    Since age 30    Scheduled Meds:    . acetaminophen  1,000 mg Oral TID  . ampicillin-sulbactam (UNASYN) IV  3 g Intravenous Q6H  . enalapril  10 mg Oral QPM  . insulin aspart  0-5 Units Subcutaneous QHS  . insulin aspart  0-9 Units Subcutaneous TID WC  . insulin glargine  15 Units Subcutaneous QHS  . metoCLOPramide  10 mg Oral QID  . multivitamin with minerals  1 tablet Oral Daily  . ondansetron  4 mg Oral Q6H  . pantoprazole  80 mg Oral Q1200  . psyllium  1 packet Oral BID  . saccharomyces boulardii  250 mg Oral BID  . sodium chloride  3 mL Intravenous Q12H  . DISCONTD: naproxen  500 mg Oral BID WC  . DISCONTD: sucralfate  1 g Oral QID   Continuous Infusions:    . dextrose    . DISCONTD: 0.9 % NaCl with KCl 20 mEq / L 50 mL/hr at 11/16/11 0019   PRN Meds:.sodium chloride, alum & mag hydroxide-simeth, bisacodyl, dextrose, dextrose, dextrose, diphenhydrAMINE, glucagon, glucagon, glucose-Vitamin C, hydrALAZINE, HYDROmorphone (DILAUDID) injection, lactated ringers, magic mouthwash, magnesium hydroxide, metoprolol tartrate, ondansetron, oxyCODONE, promethazine, promethazine, promethazine, sodium chloride, DISCONTD: acetaminophen, DISCONTD: acetaminophen, DISCONTD:  HYDROmorphone (DILAUDID) injection DISCONTD: magnesium hydroxide   Ht: 5\' 7"  (170.2 cm)  Wt: 115 lb (52.164 kg)  Ideal Wt: 61.6 kg  % Ideal Wt: 85%  Usual Wt: 135 lb 1 month ago, per pt % Usual Wt: 85%  Body mass index is 18.01 kg/(m^2). Underweight  Food/Nutrition Related Hx:  Pt reports 20 lb weight loss in past month (15% weight loss in 1 month  classified as severe % weight loss). Pt also reports N/V/abdominal pain/diarrhea PTA. Pt reports not eating much PTA, but that has been improving since admission. Pt willing to try supplement beverage. Laproscopic cholecystectomy 11/15/11.  Labs:  CMP     Component Value Date/Time   NA 134* 11/15/2011 0411   K 3.6 11/15/2011 0411   CL 97 11/15/2011 0411   CO2 28 11/15/2011 0411   GLUCOSE 135* 11/15/2011 0411   BUN 6 11/15/2011 0411   CREATININE 0.41* 11/15/2011 0411   CALCIUM 9.1 11/15/2011 0411   PROT 6.4 11/15/2011 0411   ALBUMIN 3.5 11/15/2011 0411   AST 11 11/15/2011 0411   ALT 8 11/15/2011 0411   ALKPHOS 46 11/15/2011 0411   BILITOT 0.6 11/15/2011 0411   GFRNONAA >90 11/15/2011 0411   GFRAA >90 11/15/2011 0411    CBG (last 3)   Basename 11/16/11 1229 11/16/11 0822 11/16/11 0757  GLUCAP 130* 76 65*    Lab Results  Component Value Date   HGBA1C 6.6* 11/14/2011   HGBA1C 12.9* 09/18/2011   HGBA1C 12.2 02/14/2010   Lab Results  Component Value Date   LDLCALC 62 01/26/2009   CREATININE 0.41* 11/15/2011    Intake/Output Summary (Last 24 hours) at 11/16/11 1259 Last data filed at 11/16/11 0019  Gross per 24 hour  Intake    790 ml  Output   1200 ml  Net   -410 ml    Diet Order: Carb Control, Medium calorie (100% PO  intake documented)  Supplements/Tube Feeding: None at this time.  IVF:     dextrose   DISCONTD: 0.9 % NaCl with KCl 20 mEq / L Last Rate: 50 mL/hr at 11/16/11 0019    Estimated Nutritional Needs:   Kcal: 1850-2050 Protein: 70-80 gm Fluid: 1 mL per kcal  NUTRITION DIAGNOSIS: -Inadequate oral intake (NI-2.1).  Status: Ongoing  RELATED TO: N/V/abdominal pain  AS EVIDENCE BY: 15% weight loss in 1 month  MONITORING/EVALUATION(Goals): Goal: Pt to consume >90% estimated nutritional needs. Monitor: PO intake, weight, labs  EDUCATION NEEDS: -No education needs identified at this time  INTERVENTION: Encourage PO intake at meals. Will order [employer]  Breeze BID between meals, per pt preference (8 oz provides 250 calories, 9 gm protein, 54 gm CHO).   DOCUMENTATION CODES Per approved criteria  -Severe malnutrition in the context of acute illness or injury -Underweight    [name] 11/16/2011, 12:59 PM

## 2011-11-16 NOTE — Consult Note (Signed)
[facility] PROGRESS NOTE  [name] ZOX:[id number] DOB: [birth year] DOA: 11/14/2011 PCP: [name], NP   Impression/Recommendations   Active Problems:  Diabetes type 1, controlled  - A1c 6.6 indicating relatively good glucose control  - CBG under relatively good control while in hospital - when patient is ready for discharge there is no reason why she should not be able to continue her usual medical regimen as she has very good glucose control with combination of subQ and PO medications - diabetic coordinator consult ordered  to confirm the current regimen; I think it would be reasonable to hold metformin for at least 1 week or as long as she is receiving antibiotics as her renal function may worsen while on concurrent antibiotics and metformin  Hypertension  - BP 130/70  - at goal  - continue lisinopril   Chronic cholecystitis  - per surgery   Gastroparesis  - continue reglan  Code Status: full code Family Communication: updated at bedside Disposition Plan: per primary team  Will sign off, please call the number below if further concerns or questions  Manson Passey, MD  [facility] Pager [phone] Cell# [phone]  If 7PM-7AM, please contact night-coverage [email or web address] Password [email or web address] 11/16/2011, 2:06 PM   LOS: 2 days   HPI/Subjective: No acute overnight events.  Objective: Filed Vitals:   11/15/11 1300 11/15/11 1647 11/15/11 2110 11/16/11 0541  BP: 128/89 140/95 115/74 128/86  Pulse: 93 99 99 103  Temp: 97.5 F (36.4 C) 98.2 F (36.8 C) 98.1 F (36.7 C) 97.5 F (36.4 C)  TempSrc: Oral Oral Oral Oral  Resp: 15 16 18 18   Height:      Weight:      SpO2: 100% 100% 100% 100%    Intake/Output Summary (Last 24 hours) at 11/16/11 1406 Last data filed at 11/16/11 0019  Gross per 24 hour  Intake    790 ml  Output   1200 ml  Net   -410 ml    Exam:   General:  Pt is alert, follows commands appropriately, not in acute  distress  Cardiovascular: Regular rate and rhythm, S1/S2, no murmurs, no rubs, no gallops  Respiratory: Clear to auscultation bilaterally, no wheezing, no crackles, no rhonchi  Abdomen: Soft, tender across mid abdomen to palpation, non distended, bowel sounds present, no guarding  Extremities: No edema, pulses DP and PT palpable bilaterally  Neuro: Grossly nonfocal  Data Reviewed: Basic Metabolic Panel:  Lab 11/15/11 6578 11/14/11 1605 11/12/11 1455 11/11/11 1610 11/11/11 1535  NA 134* 132* 133* 135 135  K 3.6 3.6 4.0 3.9 4.0  CL 97 91* 92* 98 95*  CO2 28 27 22 25 29   GLUCOSE 135* 298* 212* 189* 220*  BUN 6 6 6 7 7   CREATININE 0.41* 0.46* 0.45* 0.53 0.56  CALCIUM 9.1 9.9 9.9 9.3 10.2  MG -- 1.8 -- -- --  PHOS -- -- -- -- --   Liver Function Tests:  Lab 11/15/11 0411 11/14/11 1605 11/12/11 1455 11/11/11 1610  AST 11 11 13 16   ALT 8 10 13 15   ALKPHOS 46 62 61 56  BILITOT 0.6 0.6 0.9 0.7  PROT 6.4 8.0 8.0 7.2  ALBUMIN 3.5 4.5 4.6 4.0    Lab 11/15/11 0411 11/11/11 1610  LIPASE 24 22  AMYLASE -- --   No results found for this basename: AMMONIA:5 in the last 168 hours CBC:  Lab 11/15/11 0411 11/14/11 1605 11/12/11 1455 11/11/11 1535  WBC 4.1 3.7* 2.7* 4.2  NEUTROABS -- --  1.6* --  HGB 12.7 15.3* 14.8 15.3*  HCT 34.4* 41.4 40.1 41.4  MCV 84.1 84.8 84.1 84.1  PLT 227 287 254 298   CBG:  Lab 11/16/11 1229 11/16/11 0822 11/16/11 0757 11/15/11 2129 11/15/11 1646  GLUCAP 130* 76 65* 131* 71    No results found for this or any previous visit (from the past 240 hour(s)).   Studies: Dg Cholangiogram Operative  11/15/2011  *RADIOLOGY REPORT*  Clinical Data:   Evaluate for common bile duct stone. Cholecystectomy.  INTRAOPERATIVE CHOLANGIOGRAM  Technique:  Cholangiographic images from the C-arm fluoroscopic device were submitted for interpretation post-operatively.  Please see the procedural report for the amount of contrast and the fluoroscopy time utilized.  Comparison:   Nuclear medicine study 11/14/2011.  Findings:  Normal caliber of the common duct, without persistent filling defect to suggest choledocholithiasis.  No contrast extravasation.  Free spill of contrast into the descending duodenum.  IMPRESSION: No evidence of choledocholithiasis.  Original Report Authenticated By: Consuello Bossier, M.D.    Scheduled Meds:   . acetaminophen  1,000 mg Oral TID  . ampicillin-sulbactam (UNASYN) IV  3 g Intravenous Q6H  . enalapril  10 mg Oral QPM  . feeding supplement  1 Container Oral BID BM  . insulin aspart  0-5 Units Subcutaneous QHS  . insulin aspart  0-9 Units Subcutaneous TID WC  . insulin glargine  15 Units Subcutaneous QHS  . metoCLOPramide  10 mg Oral QID  . multivitamin with minerals  1 tablet Oral Daily  . ondansetron  4 mg Oral Q6H  . pantoprazole  80 mg Oral Q1200  . psyllium  1 packet Oral BID  . saccharomyces boulardii  250 mg Oral BID   Continuous Infusions:   . dextrose    . DISCONTD: 0.9 % NaCl with KCl 20 mEq / L 50 mL/hr at 11/16/11 0019

## 2011-11-16 NOTE — Progress Notes (Signed)
Hypoglycemic Event  CBG: 65 @0800   Treatment: 15 GM carbohydrate snack @0805   Symptoms: Shaky and Hungry  Follow-up CBG: Time:0820 CBG Result:76 (breakfast ordered)  Possible Reasons for Event: Unknown  Comments/MD notified: Dr. [name] aware on rounds at 0805    [name]  Remember to initiate Hypoglycemia Order Set & complete

## 2011-11-16 NOTE — Progress Notes (Signed)
[name] [id number] 03/14/1991  CARE TEAM:  PCP: [name], NP  Outpatient Care Team: Patient Care Team: [name] as PCP - General (Family Medicine)  Inpatient Treatment Team: Treatment Team: Attending Provider: Nolon Nations, MD; Technician: Allyne Gee, NT; Consulting Physician: Nolon Nations, MD; Rounding Team: Redmond Baseman, MD; Technician: Patience Musca, NT; Registered Nurse: Rosie Fate, RN; Registered Nurse: Louis Matte, RN; Registered Nurse: Derek Jack, RN; Respiratory Therapist: Gonzella Lex, RRT  Subjective:  Sore Not wanting to get up much Low glc - getting PO  Husband other in room  Objective:  Vital signs:  Filed Vitals:   11/15/11 1300 11/15/11 1647 11/15/11 2110 11/16/11 0541  BP: 128/89 140/95 115/74 128/86  Pulse: 93 99 99 103  Temp: 97.5 F (36.4 C) 98.2 F (36.8 C) 98.1 F (36.7 C) 97.5 F (36.4 C)  TempSrc: Oral Oral Oral Oral  Resp: 15 16 18 18   Height:      Weight:      SpO2: 100% 100% 100% 100%    Last BM Date: 11/13/11  Intake/Output   Yesterday:  08/17 0701 - 08/18 0700 In: 1990 [P.O.:240; I.V.:1750] Out: 1225 [Urine:1200; Blood:25] This shift:     Bowel function:  Flatus: y  BM: n  Physical Exam:  General: Pt awakens, oriented x4 in no acute distress.   Eyes: PERRL, normal EOM.  Sclera clear.  No icterus Neuro: CN II-XII intact w/o focal sensory/motor deficits. Lymph: No head/neck/groin lymphadenopathy Psych:  No delerium/psychosis/paranoia.  Mildly anxious but consolable HENT: Normocephalic, Mucus membranes moist.  No thrush Neck: Supple, No tracheal deviation Chest: No chest wall pain w good excursion CV:  Pulses intact.  Regular rhythm Abdomen: Soft.  Nondistended.  Mod tender RUQ w Murphy's sign.  No incarcerated hernias. Ext:  SCDs BLE.  No mjr edema.  No cyanosis Skin: No petechiae / purpurae  Problem List:  Active Problems:  Diabetes type 1, uncontrolled  Hypertension  associated with diabetes  Chronic cholecystitis  Gastroparesis   Assessment  [name]  21 y.o. female  1 Day Post-Op  Procedure(s): LAPAROSCOPIC CHOLECYSTECTOMY SINGLE PORT  Acalc cholecystitis, slowly recovering   Plan:  -adv diet -wean off IVF -PPI/carafate -nausea control reglan for gastroparesis -bowel regimen -max non-norcotic pain control -DM control per med -HTN control -VTE prophylaxis- SCDs, etc -mobilize as tolerated to help recovery   Poss d/c tomorrow if continues to improve  [name], M.D., F.A.C.S. Gastrointestinal and Minimally Invasive Surgery [facility] [address] [place], [place] [address] [phone] Main / Paging [phone] Voice Mail   11/16/2011  Results:   Labs: Results for orders placed during the hospital encounter of 11/14/11 (from the past 48 hour(s))  GLUCOSE, CAPILLARY     Status: Abnormal   Collection Time   11/14/11  3:46 PM      Component Value Range Comment   Glucose-Capillary 288 (*) 70 - 99 mg/dL    Comment 1 Notify RN     COMPREHENSIVE METABOLIC PANEL     Status: Abnormal   Collection Time   11/14/11  4:05 PM      Component Value Range Comment   Sodium 132 (*) 135 - 145 mEq/L    Potassium 3.6  3.5 - 5.1 mEq/L    Chloride 91 (*) 96 - 112 mEq/L    CO2 27  19 - 32 mEq/L    Glucose, Bld 298 (*) 70 - 99 mg/dL  BUN 6  6 - 23 mg/dL    Creatinine, Ser 0.46 (*) 0.50 - 1.10 mg/dL    Calcium 9.9  8.4 - 10.5 mg/dL    Total Protein 8.0  6.0 - 8.3 g/dL    Albumin 4.5  3.5 - 5.2 g/dL    AST 11  0 - 37 U/L    ALT 10  0 - 35 U/L    Alkaline Phosphatase 62  39 - 117 U/L    Total Bilirubin 0.6  0.3 - 1.2 mg/dL    GFR calc non Af Amer >90  >90 mL/min    GFR calc Af Amer >90  >90 mL/min   MAGNESIUM     Status: Normal   Collection Time   11/14/11  4:05 PM      Component Value Range Comment   Magnesium 1.8  1.5 - 2.5 mg/dL   APTT     Status: Normal   Collection Time    11/14/11  4:05 PM      Component Value Range Comment   aPTT 30  24 - 37 seconds   PROTIME-INR     Status: Normal   Collection Time   11/14/11  4:05 PM      Component Value Range Comment   Prothrombin Time 14.8  11.6 - 15.2 seconds    INR 1.14  0.00 - 1.49   PRO B NATRIURETIC PEPTIDE     Status: Normal   Collection Time   11/14/11  4:05 PM      Component Value Range Comment   Pro B Natriuretic peptide (BNP) 20.0  0 - 125 pg/mL   CBC     Status: Abnormal   Collection Time   11/14/11  4:05 PM      Component Value Range Comment   WBC 3.7 (*) 4.0 - 10.5 K/uL    RBC 4.88  3.87 - 5.11 MIL/uL    Hemoglobin 15.3 (*) 12.0 - 15.0 g/dL    HCT 41.4  36.0 - 46.0 %    MCV 84.8  78.0 - 100.0 fL    MCH 31.4  26.0 - 34.0 pg    MCHC 37.0 (*) 30.0 - 36.0 g/dL    RDW 11.6  11.5 - 15.5 %    Platelets 287  150 - 400 K/uL   LACTIC ACID, PLASMA     Status: Normal   Collection Time   11/14/11  5:25 PM      Component Value Range Comment   Lactic Acid, Venous 1.2  0.5 - 2.2 mmol/L   HEMOGLOBIN A1C     Status: Abnormal   Collection Time   11/14/11  5:25 PM      Component Value Range Comment   Hemoglobin A1C 6.6 (*) <5.7 %    Mean Plasma Glucose 143 (*) <117 mg/dL   GLUCOSE, CAPILLARY     Status: Abnormal   Collection Time   11/14/11  5:39 PM      Component Value Range Comment   Glucose-Capillary 257 (*) 70 - 99 mg/dL   URINALYSIS, ROUTINE W REFLEX MICROSCOPIC     Status: Abnormal   Collection Time   11/14/11  5:45 PM      Component Value Range Comment   Color, Urine YELLOW  YELLOW    APPearance CLOUDY (*) CLEAR    Specific Gravity, Urine 1.018  1.005 - 1.030    pH 6.0  5.0 - 8.0    Glucose, UA 500 (*) NEGATIVE mg/dL  Hgb urine dipstick NEGATIVE  NEGATIVE    Bilirubin Urine MODERATE (*) NEGATIVE    Ketones, ur >80 (*) NEGATIVE mg/dL    Protein, ur 30 (*) NEGATIVE mg/dL    Urobilinogen, UA 1.0  0.0 - 1.0 mg/dL    Nitrite NEGATIVE  NEGATIVE    Leukocytes, UA NEGATIVE  NEGATIVE   URINE  MICROSCOPIC-ADD ON     Status: Normal   Collection Time   11/14/11  5:45 PM      Component Value Range Comment   Squamous Epithelial / LPF RARE  RARE    WBC, UA 3-6  <3 WBC/hpf    Urine-Other MUCOUS PRESENT   AMORPHOUS URATES/PHOSPHATES  GLUCOSE, CAPILLARY     Status: Abnormal   Collection Time   11/14/11 10:03 PM      Component Value Range Comment   Glucose-Capillary 162 (*) 70 - 99 mg/dL   CBC     Status: Abnormal   Collection Time   11/15/11  4:11 AM      Component Value Range Comment   WBC 4.1  4.0 - 10.5 K/uL    RBC 4.09  3.87 - 5.11 MIL/uL    Hemoglobin 12.7  12.0 - 15.0 g/dL    HCT 34.4 (*) 36.0 - 46.0 %    MCV 84.1  78.0 - 100.0 fL    MCH 31.1  26.0 - 34.0 pg    MCHC 36.9 (*) 30.0 - 36.0 g/dL    RDW 11.5  11.5 - 15.5 %    Platelets 227  150 - 400 K/uL   COMPREHENSIVE METABOLIC PANEL     Status: Abnormal   Collection Time   11/15/11  4:11 AM      Component Value Range Comment   Sodium 134 (*) 135 - 145 mEq/L    Potassium 3.6  3.5 - 5.1 mEq/L    Chloride 97  96 - 112 mEq/L    CO2 28  19 - 32 mEq/L    Glucose, Bld 135 (*) 70 - 99 mg/dL    BUN 6  6 - 23 mg/dL    Creatinine, Ser 0.41 (*) 0.50 - 1.10 mg/dL    Calcium 9.1  8.4 - 10.5 mg/dL    Total Protein 6.4  6.0 - 8.3 g/dL    Albumin 3.5  3.5 - 5.2 g/dL    AST 11  0 - 37 U/L    ALT 8  0 - 35 U/L    Alkaline Phosphatase 46  39 - 117 U/L    Total Bilirubin 0.6  0.3 - 1.2 mg/dL    GFR calc non Af Amer >90  >90 mL/min    GFR calc Af Amer >90  >90 mL/min   LIPASE, BLOOD     Status: Normal   Collection Time   11/15/11  4:11 AM      Component Value Range Comment   Lipase 24  11 - 59 U/L   GLUCOSE, CAPILLARY     Status: Abnormal   Collection Time   11/15/11  7:42 AM      Component Value Range Comment   Glucose-Capillary 174 (*) 70 - 99 mg/dL   GLUCOSE, CAPILLARY     Status: Abnormal   Collection Time   11/15/11 10:13 AM      Component Value Range Comment   Glucose-Capillary 177 (*) 70 - 99 mg/dL   GLUCOSE, CAPILLARY      Status: Abnormal   Collection Time   11/15/11 12:22 PM  Component Value Range Comment   Glucose-Capillary 160 (*) 70 - 99 mg/dL   GLUCOSE, CAPILLARY     Status: Normal   Collection Time   11/15/11  4:46 PM      Component Value Range Comment   Glucose-Capillary 71  70 - 99 mg/dL   GLUCOSE, CAPILLARY     Status: Abnormal   Collection Time   11/15/11  9:29 PM      Component Value Range Comment   Glucose-Capillary 131 (*) 70 - 99 mg/dL   GLUCOSE, CAPILLARY     Status: Abnormal   Collection Time   11/16/11  7:57 AM      Component Value Range Comment   Glucose-Capillary 65 (*) 70 - 99 mg/dL     Imaging / Studies: Dg Cholangiogram Operative  11/15/2011  *RADIOLOGY REPORT*  Clinical Data:   Evaluate for common bile duct stone. Cholecystectomy.  INTRAOPERATIVE CHOLANGIOGRAM  Technique:  Cholangiographic images from the C-arm fluoroscopic device were submitted for interpretation post-operatively.  Please see the procedural report for the amount of contrast and the fluoroscopy time utilized.  Comparison:  Nuclear medicine study 11/14/2011.  Findings:  Normal caliber of the common duct, without persistent filling defect to suggest choledocholithiasis.  No contrast extravasation.  Free spill of contrast into the descending duodenum.  IMPRESSION: No evidence of choledocholithiasis.  Original Report Authenticated By: Areta Haber, M.D.   Nm Hepatobiliary  11/14/2011  *RADIOLOGY REPORT*  Clinical Data:  NUCLEAR MEDICINE HEPATOBILIARY IMAGING  Technique:  Sequential images of the abdomen were obtained out to 90 minutes following intravenous administration of radiopharmaceutical. Due to nonvisualization of the gallbladder, the patient was injected with an additional 1.0 mCi technetium 63mCholetec and slow intravenous infusion of 2.2 mg morphine was administered, while sequential images were continued for another 30 minutes.  Radiopharmaceutical:  5.043m [id number]  Comparison:  None.  Findings:  There is prompt radiopharmaceutical uptake by the liver. Liver is normal appearance.  There is prompt biliary excretion activity, with activity initially seen within the duodenum at 15 minutes.  Reflux of biliary activity into the stomach is seen which showed progression over 90 minutes.  No gallbladder activity is identified either before or following intravenous infusion of morphine.  IMPRESSION:  1.  Nonvisualization of gallbladder, consistent with cystic duct obstruction. 2.  Prominent bile reflux into the stomach. 3.  No evidence of biliary obstruction.  Original Report Authenticated By: JOHMarlaine Hind.D.    Medications / Allergies: per chart  Antibiotics: Anti-infectives     Start     Dose/Rate Route Frequency Ordered Stop   11/15/11 1200  Ampicillin-Sulbactam (UNASYN) 3 g in sodium chloride 0.9 % 100 mL IVPB       3 g 100 mL/hr over 60 Minutes Intravenous Every 6 hours 11/15/11 1059 11/15/11 1759   11/14/11 1800   ciprofloxacin (CIPRO) IVPB 400 mg  Status:  Discontinued        400 mg 200 mL/hr over 60 Minutes Intravenous Every 12 hours 11/14/11 1712 11/15/11 1059   11/14/11 1800   Ampicillin-Sulbactam (UNASYN) 3 g in sodium chloride 0.9 % 100 mL IVPB  Status:  Discontinued        3 g 100 mL/hr over 60 Minutes Intravenous Every 6 hours 11/14/11 1757 11/15/11 1059

## 2011-11-17 ENCOUNTER — Encounter (HOSPITAL_COMMUNITY): Payer: Self-pay | Admitting: Surgery

## 2011-11-17 LAB — GLUCOSE, CAPILLARY
Glucose-Capillary: 72 mg/dL (ref 70–99)
Glucose-Capillary: 74 mg/dL (ref 70–99)

## 2011-11-17 LAB — LIPASE, BLOOD: Lipase: 21 U/L (ref 11–59)

## 2011-11-17 MED ORDER — OXYCODONE HCL 5 MG PO TABS: 5.0000 mg | ORAL_TABLET | ORAL | Status: DC | PRN

## 2011-11-17 NOTE — Progress Notes (Signed)
Patient provided with discharge instructions and prescription. Patient verbalized understanding. Patient discharged to home. 

## 2011-11-17 NOTE — Discharge Summary (Signed)
Patient ID: [name] MRN: [id number] DOB/AGE: Jun 12, 1990 21 y.o.  Admit date: 11/14/2011 Discharge date: 11/17/2011  Procedures: laparoscopic cholecystectomy  Consults: medicine  Reason for Admission: this is a 21 yo female who has had years of abdominal pain with no explanation.  She was sent to GI last week and a HIDA scan was ordered.  She was found to have a non-visualization of her gallbladder and referred to the [facility] for admission.  Admission Diagnoses:  1. Cystic duct obstruction, c/w cholecystitis 2. Type 1 DM  Hospital Course: The patient was admitted and taken to the operating room on Saturday.  Her gallbladder was removed without a problem.  Post-operatively she had some pain and felt too weak to go home on POD# 1.  Her diet was able to be advanced and she was started on oral pain medication.  On POD# 2, the patient was feeling better and wanting to go home.  IM was asked to see the patient to assist with glucose control.  PE: Abd: soft, appropriately tender, +BS, ND, incisions c/d/i  Discharge Diagnoses:  Active Problems:  Diabetes type 1, uncontrolled  Hypertension associated with diabetes  Chronic cholecystitis  Gastroparesis s/p lap chole  Discharge Medications: Medication List  As of 11/17/2011  8:56 AM   STOP taking these medications         ondansetron 4 MG tablet         TAKE these medications         aspirin 325 MG tablet   Take 325 mg by mouth daily.      enalapril 10 MG tablet   Commonly known as: VASOTEC   Take 10 mg by mouth every evening.      HYDROcodone-acetaminophen 5-325 MG per tablet   Commonly known as: NORCO/VICODIN   Take 1 tablet by mouth every 8 (eight) hours as needed. For pain      insulin aspart 100 UNIT/ML injection   Commonly known as: novoLOG   Inject 0-15 Units into the skin 3 (three) times daily before meals. 5 u with each meal + sliding scale: CBG 70 - 120: 0 u: CBG 121 - 150: 2 u; CBG 151 - 200: 3 u; CBG 201 - 250: 5  u; CBG 251 - 300: 8 u;CBG 301 - 350: 11 u; CBG 351 - 400: 15 u; CBG > 400 Call MD      insulin glargine 100 UNIT/ML injection   Commonly known as: LANTUS   Inject 30 Units into the skin at bedtime.      metFORMIN 500 MG tablet   Commonly known as: GLUCOPHAGE   Take 500 mg by mouth 2 (two) times daily with a meal.      metoCLOPramide 10 MG tablet   Commonly known as: REGLAN   Take 10 mg by mouth 4 (four) times daily.      multivitamin with minerals Tabs   Take 1 tablet by mouth daily.      omeprazole 20 MG capsule   Commonly known as: PRILOSEC   Take 20 mg by mouth daily.      oxyCODONE 5 MG immediate release tablet   Commonly known as: Oxy IR/ROXICODONE   Take 1-2 tablets (5-10 mg total) by mouth every 6 (six) hours as needed for pain.      oxyCODONE 5 MG immediate release tablet   Commonly known as: Oxy IR/ROXICODONE   Take 1-2 tablets (5-10 mg total) by mouth every 4 (four) hours as needed.  sucralfate 1 G tablet   Commonly known as: CARAFATE   Take 1 g by mouth 4 (four) times daily.            Discharge Instructions: Follow-up Information    Follow up with GROSS,STEVEN C., MD. Schedule an appointment as soon as possible for a visit in 3 weeks.   Contact information:   [address] [place] [address] [phone]          Signed: Letha Cape 11/17/2011, 8:56 AM

## 2011-11-17 NOTE — Discharge Summary (Signed)
General Surgery [facility]  Patient discharged.  To follow up with Dr. Johney Maine. Earnstine Regal, MD, [facility] Office: [phone]

## 2011-11-17 NOTE — Care Management Note (Signed)
    Page 1 of 1   11/17/2011     10:52:18 AM   CARE MANAGEMENT NOTE 11/17/2011  Patient:  [name], [name]   Account Number:  [id number]  Date Initiated:  11/17/2011  Documentation initiated by:  Lorenda Ishihara  Subjective/Objective Assessment:   21 yo female admitted s/p lap chole, elevated blood sugar. PTA lived at home with spouse.     Action/Plan:   Anticipated DC Date:  11/17/2011   Anticipated DC Plan:  HOME/SELF CARE      DC Planning Services  CM consult      Choice offered to / List presented to:             Status of service:  Completed, signed off Medicare Important Message given?   (If response is "NO", the following Medicare IM given date fields will be blank) Date Medicare IM given:   Date Additional Medicare IM given:    Discharge Disposition:  HOME/SELF CARE  Per UR Regulation:  Reviewed for med. necessity/level of care/duration of stay  If discussed at Long Length of Stay Meetings, dates discussed:    Comments:

## 2011-11-19 ENCOUNTER — Other Ambulatory Visit (HOSPITAL_COMMUNITY): Payer: 59

## 2011-11-20 ENCOUNTER — Telehealth (INDEPENDENT_AMBULATORY_CARE_PROVIDER_SITE_OTHER): Payer: Self-pay | Admitting: General Surgery

## 2011-11-20 NOTE — Telephone Encounter (Signed)
PT CALLED REQUESTING PAIN MEDICATION REFILL. SHE IS CURRENLY TAKING HER OXYCODONE EVERY 4 HOURS. SHE IS CALLING TODAY WITH 11 TABLETS LEFT SO THAT SHE WILL BE COVERED OVER THE WEEKEND. I ENCOURAGED HER TO TRY CUTTING BACK ON FREQUENCY SINCE SHE IS 3-4 DAYS OUT FROM SURGERY. I REVIEWED THIS WITH DR. [name] AND HE OK'D STANDARD POST-OP PAIN MEDICATION/ HYDROCODONE 5/325 # 30 CALLED TO Nicolette Bang [address]. [address]/ [phone]/ PT AWARE/GY

## 2011-11-22 ENCOUNTER — Emergency Department (HOSPITAL_COMMUNITY): Payer: 59

## 2011-11-22 ENCOUNTER — Inpatient Hospital Stay (HOSPITAL_COMMUNITY)
Admission: EM | Admit: 2011-11-22 | Discharge: 2011-12-06 | DRG: 987 | Disposition: A | Discharge status: Home / Self Care | Payer: 59 | Attending: General Surgery | Admitting: General Surgery

## 2011-11-22 DIAGNOSIS — K668 Other specified disorders of peritoneum: Secondary | ICD-10-CM | POA: Diagnosis present

## 2011-11-22 DIAGNOSIS — Z794 Long term (current) use of insulin: Secondary | ICD-10-CM

## 2011-11-22 DIAGNOSIS — K219 Gastro-esophageal reflux disease without esophagitis: Secondary | ICD-10-CM | POA: Diagnosis present

## 2011-11-22 DIAGNOSIS — Z9089 Acquired absence of other organs: Secondary | ICD-10-CM

## 2011-11-22 DIAGNOSIS — E1159 Type 2 diabetes mellitus with other circulatory complications: Secondary | ICD-10-CM

## 2011-11-22 DIAGNOSIS — E108 Type 1 diabetes mellitus with unspecified complications: Secondary | ICD-10-CM

## 2011-11-22 DIAGNOSIS — K811 Chronic cholecystitis: Secondary | ICD-10-CM

## 2011-11-22 DIAGNOSIS — K3184 Gastroparesis: Secondary | ICD-10-CM | POA: Diagnosis present

## 2011-11-22 DIAGNOSIS — F3289 Other specified depressive episodes: Secondary | ICD-10-CM | POA: Diagnosis not present

## 2011-11-22 DIAGNOSIS — F411 Generalized anxiety disorder: Secondary | ICD-10-CM | POA: Diagnosis not present

## 2011-11-22 DIAGNOSIS — Z7982 Long term (current) use of aspirin: Secondary | ICD-10-CM

## 2011-11-22 DIAGNOSIS — R079 Chest pain, unspecified: Secondary | ICD-10-CM

## 2011-11-22 DIAGNOSIS — K5900 Constipation, unspecified: Secondary | ICD-10-CM | POA: Diagnosis present

## 2011-11-22 DIAGNOSIS — K560 Paralytic ileus: Secondary | ICD-10-CM | POA: Diagnosis not present

## 2011-11-22 DIAGNOSIS — F329 Major depressive disorder, single episode, unspecified: Secondary | ICD-10-CM | POA: Diagnosis not present

## 2011-11-22 DIAGNOSIS — E1049 Type 1 diabetes mellitus with other diabetic neurological complication: Principal | ICD-10-CM | POA: Diagnosis present

## 2011-11-22 DIAGNOSIS — E43 Unspecified severe protein-calorie malnutrition: Secondary | ICD-10-CM | POA: Diagnosis present

## 2011-11-22 DIAGNOSIS — R109 Unspecified abdominal pain: Secondary | ICD-10-CM

## 2011-11-22 DIAGNOSIS — I152 Hypertension secondary to endocrine disorders: Secondary | ICD-10-CM

## 2011-11-22 DIAGNOSIS — E1065 Type 1 diabetes mellitus with hyperglycemia: Secondary | ICD-10-CM

## 2011-11-22 DIAGNOSIS — Z681 Body mass index (BMI) 19 or less, adult: Secondary | ICD-10-CM

## 2011-11-22 DIAGNOSIS — I10 Essential (primary) hypertension: Secondary | ICD-10-CM | POA: Diagnosis present

## 2011-11-22 DIAGNOSIS — Z79899 Other long term (current) drug therapy: Secondary | ICD-10-CM

## 2011-11-22 LAB — CBC
HCT: 37.6 % (ref 36.0–46.0)
Hemoglobin: 13.9 g/dL (ref 12.0–15.0)
MCH: 30.5 pg (ref 26.0–34.0)
MCHC: 37 g/dL — ABNORMAL HIGH (ref 30.0–36.0)
MCV: 82.6 fL (ref 78.0–100.0)
Platelets: 275 10*3/uL (ref 150–400)
RBC: 4.55 MIL/uL (ref 3.87–5.11)
RDW: 11.6 % (ref 11.5–15.5)
WBC: 4 10*3/uL (ref 4.0–10.5)

## 2011-11-22 LAB — PREGNANCY, URINE: Preg Test, Ur: NEGATIVE

## 2011-11-22 LAB — COMPREHENSIVE METABOLIC PANEL
ALT: 14 U/L (ref 0–35)
AST: 14 U/L (ref 0–37)
Albumin: 4.1 g/dL (ref 3.5–5.2)
Alkaline Phosphatase: 57 U/L (ref 39–117)
BUN: 11 mg/dL (ref 6–23)
CO2: 28 mEq/L (ref 19–32)
Calcium: 9.7 mg/dL (ref 8.4–10.5)
Chloride: 94 mEq/L — ABNORMAL LOW (ref 96–112)
Creatinine, Ser: 0.41 mg/dL — ABNORMAL LOW (ref 0.50–1.10)
GFR calc Af Amer: >90 mL/min (ref >90)
GFR calc non Af Amer: >90 mL/min (ref >90)
Glucose, Bld: 284 mg/dL — ABNORMAL HIGH (ref 70–99)
Potassium: 3.6 mEq/L (ref 3.5–5.1)
Sodium: 135 mEq/L (ref 135–145)
Total Bilirubin: 0.5 mg/dL (ref 0.3–1.2)
Total Protein: 7.7 g/dL (ref 6.0–8.3)

## 2011-11-22 LAB — URINALYSIS, ROUTINE W REFLEX MICROSCOPIC
Glucose, UA: 500 mg/dL — AB
Hgb urine dipstick: NEGATIVE
Ketones, ur: 15 mg/dL — AB
Leukocytes, UA: NEGATIVE
Nitrite: NEGATIVE
Protein, ur: NEGATIVE mg/dL
Specific Gravity, Urine: 1.028 (ref 1.005–1.030)
Urobilinogen, UA: 1 mg/dL (ref 0.0–1.0)
pH: 6 (ref 5.0–8.0)

## 2011-11-22 LAB — GLUCOSE, CAPILLARY: Glucose-Capillary: 288 mg/dL — ABNORMAL HIGH (ref 70–99)

## 2011-11-22 LAB — LIPASE, BLOOD: Lipase: 31 U/L (ref 11–59)

## 2011-11-22 IMAGING — CT CT ABD-PELV W/ CM
1 of 2 series · 15 of 32 positions shown, 19 images · IV contrast (OMNIPAQUE 300)
Comparison: [DATE] radiograph, [DATE] CT

CLINICAL DATA: Mid abdominal pain

CT ABDOMEN AND PELVIS WITH CONTRAST
TECHNIQUE: Multidetector CT imaging of the abdomen and pelvis was
performed following the standard protocol during bolus
administration of intravenous contrast.
Contrast: 100mL OMNIPAQUE IOHEXOL 300 MG/ML  SOLN

[Series 2: abd/pel with · axial · 0.66mm/px · z∈[-636,-226]mm · 15 of 90 slices shown, 19 images]
[im 4/90  soft-tissue]
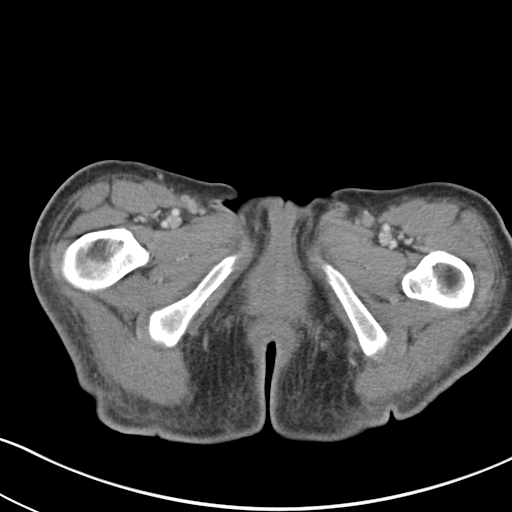
[im 4/90  bone]
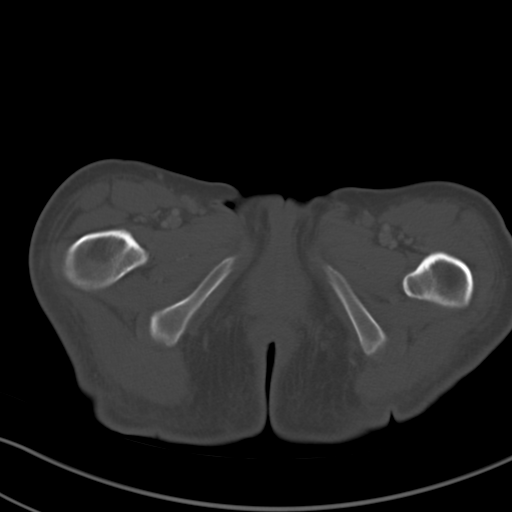
[im 12/90  soft-tissue]
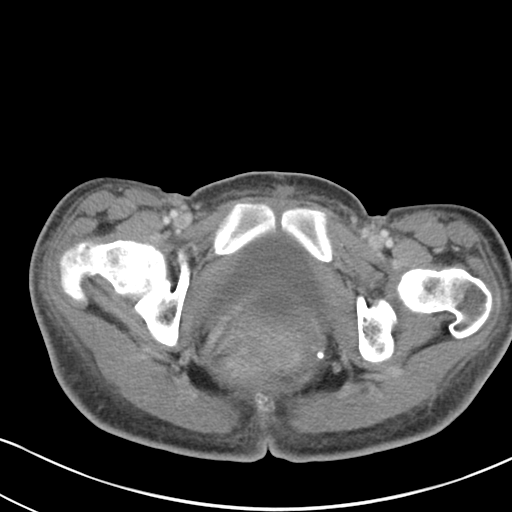
[im 19/90  soft-tissue]
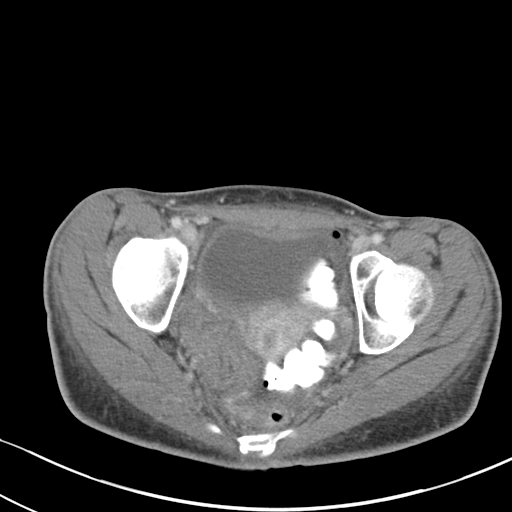
[im 26/90  soft-tissue]
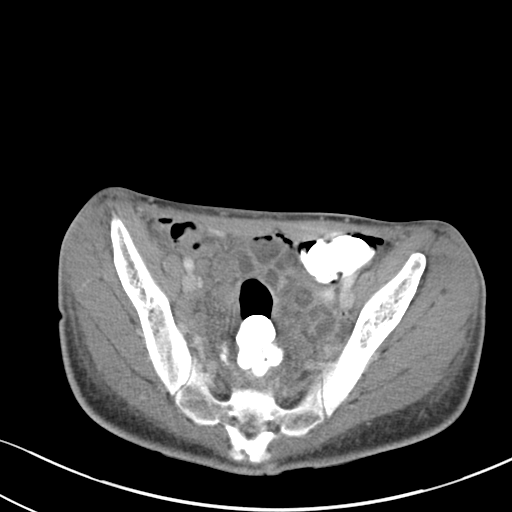
[im 30/90  soft-tissue]
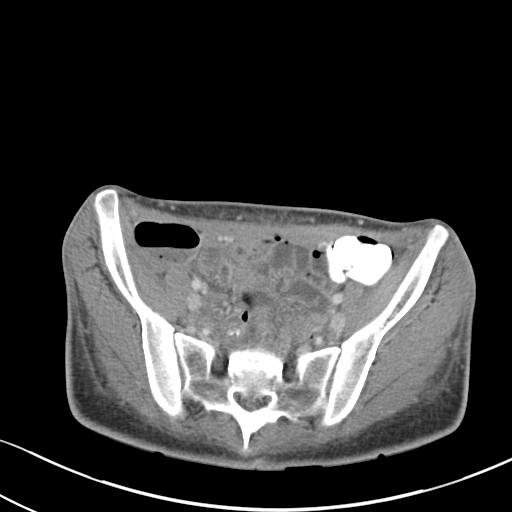
[im 38/90  soft-tissue]
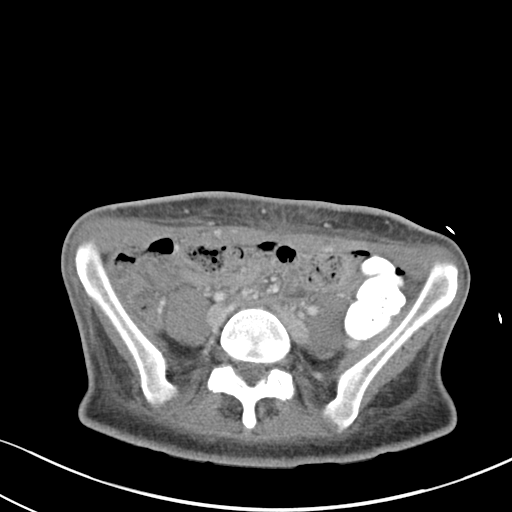
[im 45/90  soft-tissue]
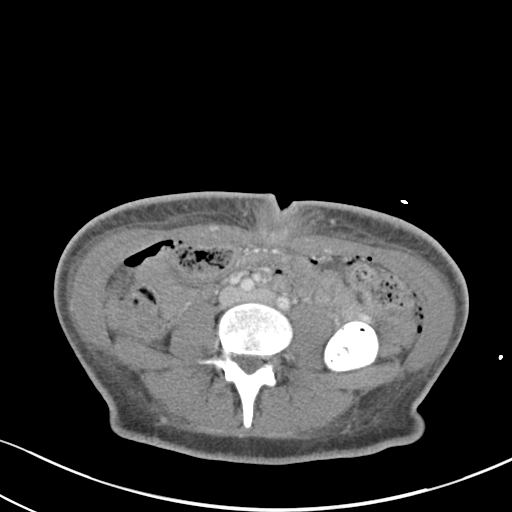
[im 52/90  soft-tissue]
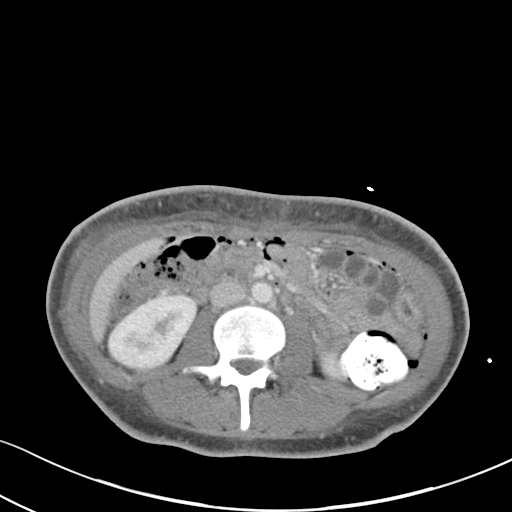
[im 60/90  soft-tissue]
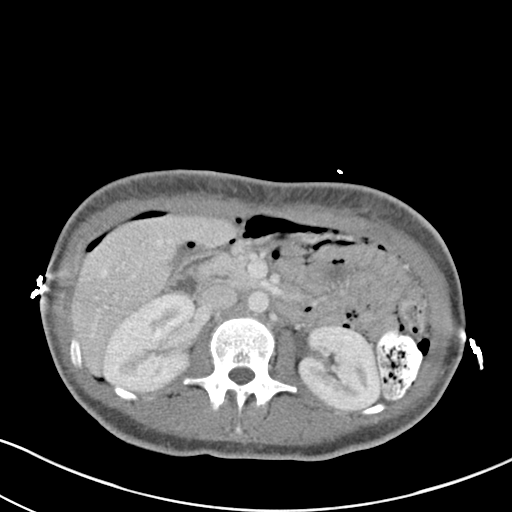
[im 60/90  bone]
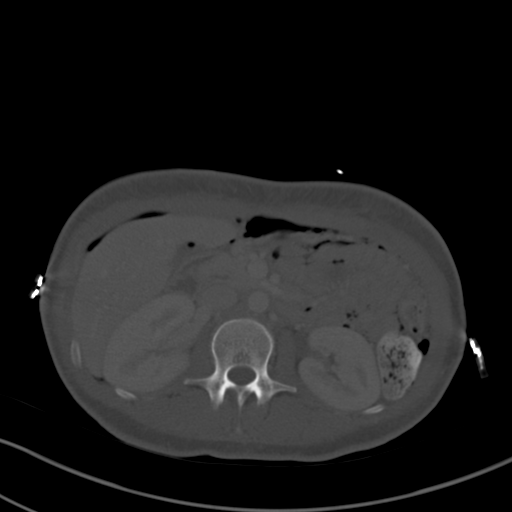
[im 64/90  soft-tissue]
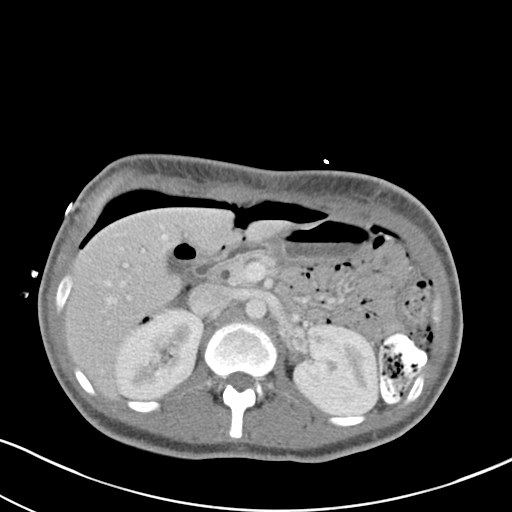
[im 71/90  soft-tissue]
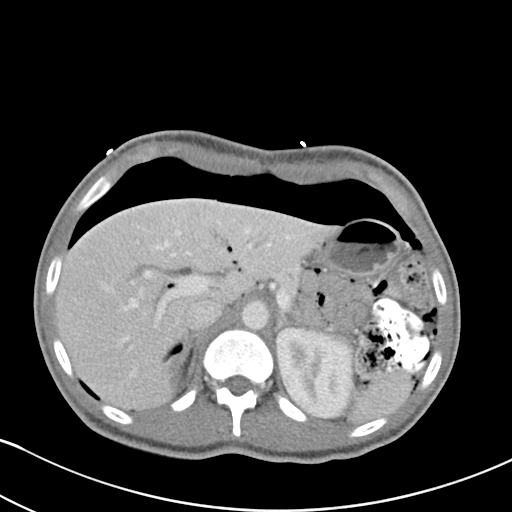
[im 75/90  lung]
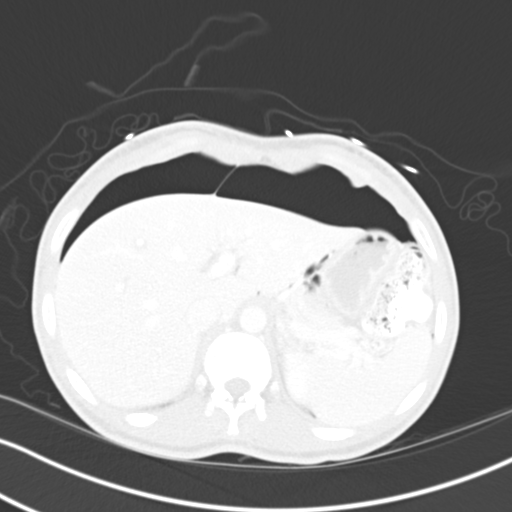
[im 78/90  soft-tissue]
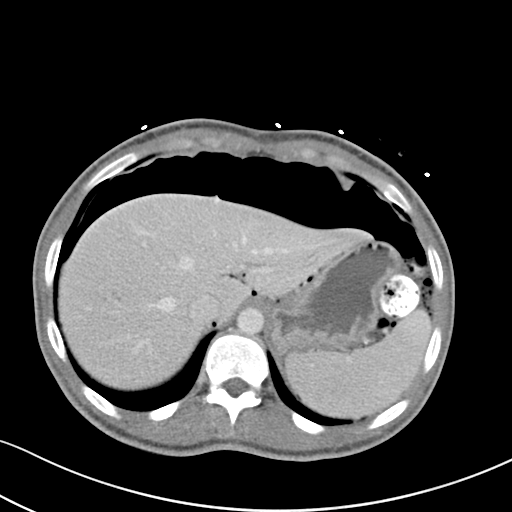
[im 78/90  lung]
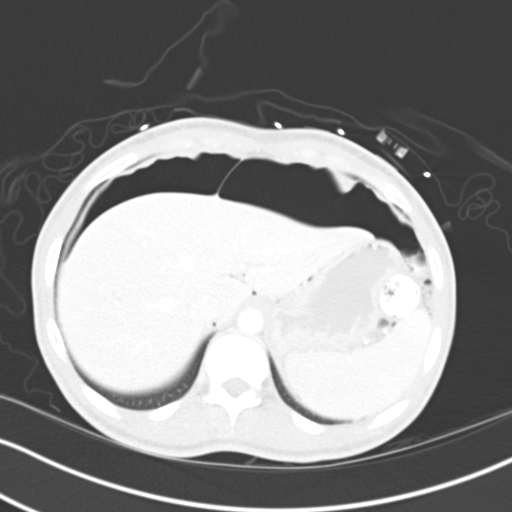
[im 82/90  lung]
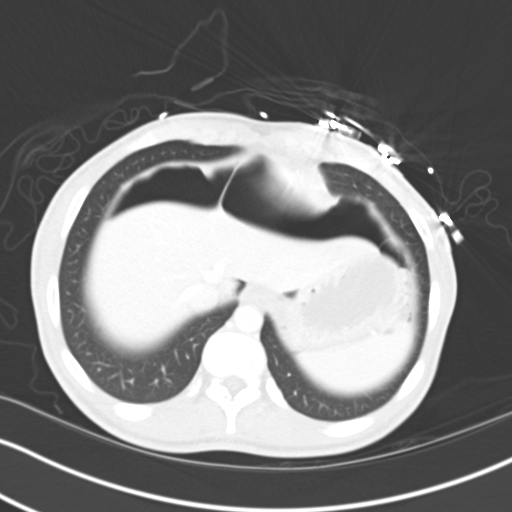
[im 86/90  soft-tissue]
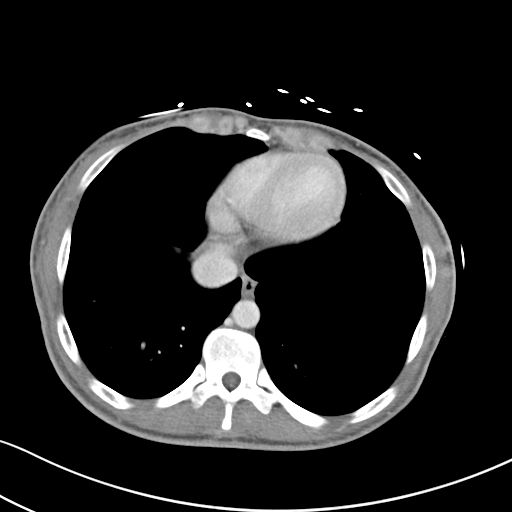
[im 86/90  lung]
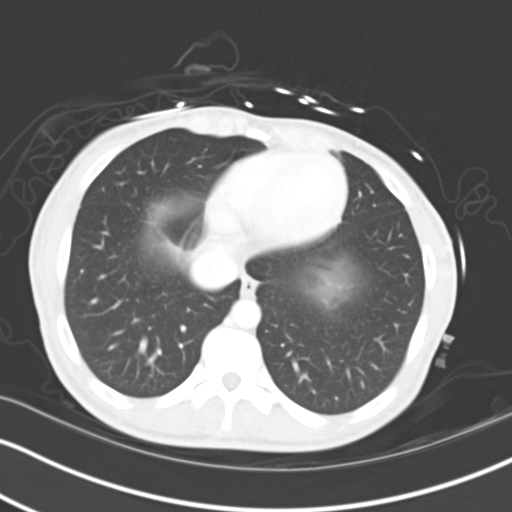

[15 of 32 positions shown; findings below may reference images not displayed]

FINDINGS: Limited images through the lung bases demonstrate no
significant appreciable abnormality. The heart size is within
normal limits. No pleural or pericardial effusion.

Significant amount of pneumoperitoneum.  Interval cholecystectomy.
Small amount of fluid and air at the surgical bed.  No loculated
fluid collection.  Mild intrahepatic biliary ductal prominence to
the level of the ampulla.  Mild main pancreatic duct dilatation is
similar to prior.  Unremarkable spleen, adrenal glands, and
kidneys.  No hydronephrosis or hydroureter.

Contrast within the colon from the previous examination. No bowel
obstruction.  Interloop gas along the left hemiabdomen.  Small
amount of free fluid within the pelvis dependently measures
slightly higher than simple fluid.

Distended, thin-walled bladder.  Unremarkable CT appearance to the
uterus and adnexa.

Normal caliber aorta and branch vessels.

Postoperative changes periumbilical.  Subcutaneous fat stranding
may be reactive or infectious. No associated fluid collection.

No acute osseous finding.
IMPRESSION: Extensive pneumoperitoneum.  The source of which is uncertain
however bowel injury not excluded. The quantity is more than
typically seen 1 week postoperative.

There is a small air and fluid collection in the surgical bed which
is not necessarily unexpected in the recently postoperative state.

Small amount of free fluid within the pelvis dependently measures
slightly higher than simple fluid.

## 2011-11-22 IMAGING — CR DG ABDOMEN ACUTE W/ 1V CHEST
4 series · 4 of 4 positions shown · non-contrast
Comparison: Intraoperative cholangiogram [DATE] and earlier.

CLINICAL DATA: 21-year-old female with epigastric pain.
Gallbladder surgery 7 days ago.

ACUTE ABDOMEN SERIES (ABDOMEN 2 VIEW & CHEST 1 VIEW)

[w chest pa]
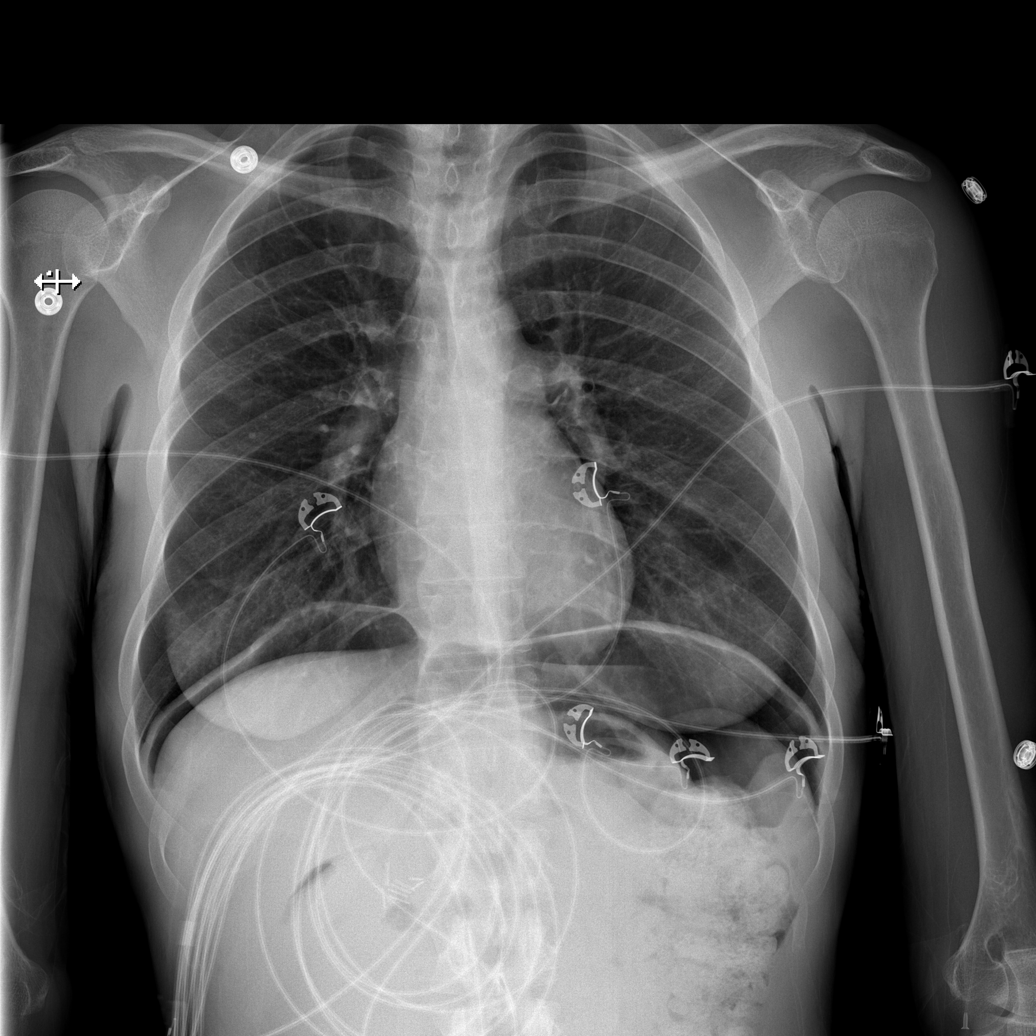

[w abdomen upright]
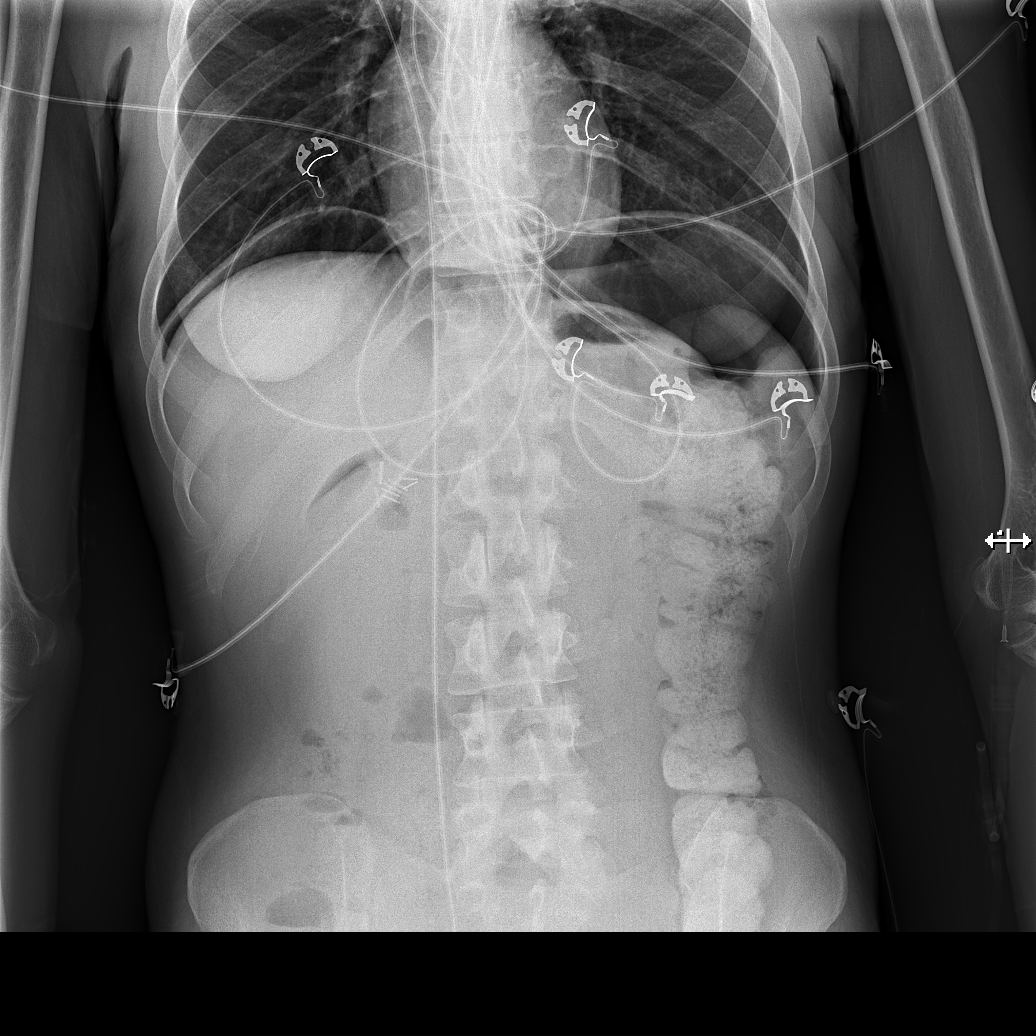

[t abdomen supine (1 of 2)]
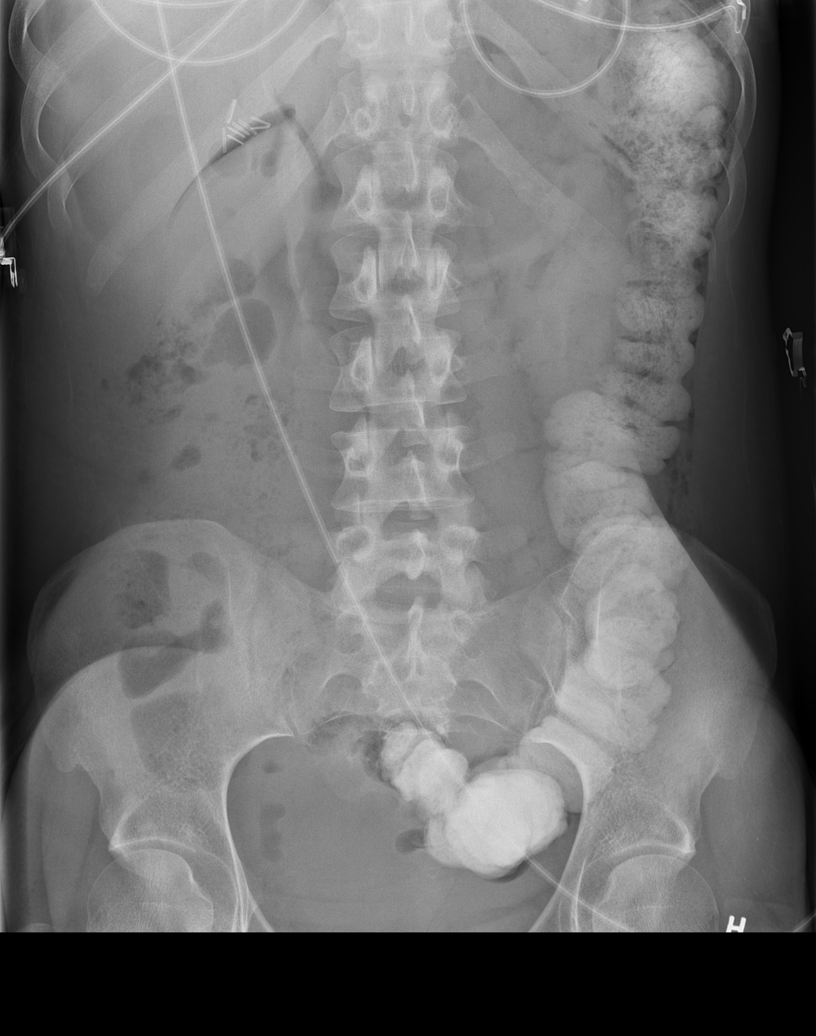

[t abdomen supine (2 of 2)]
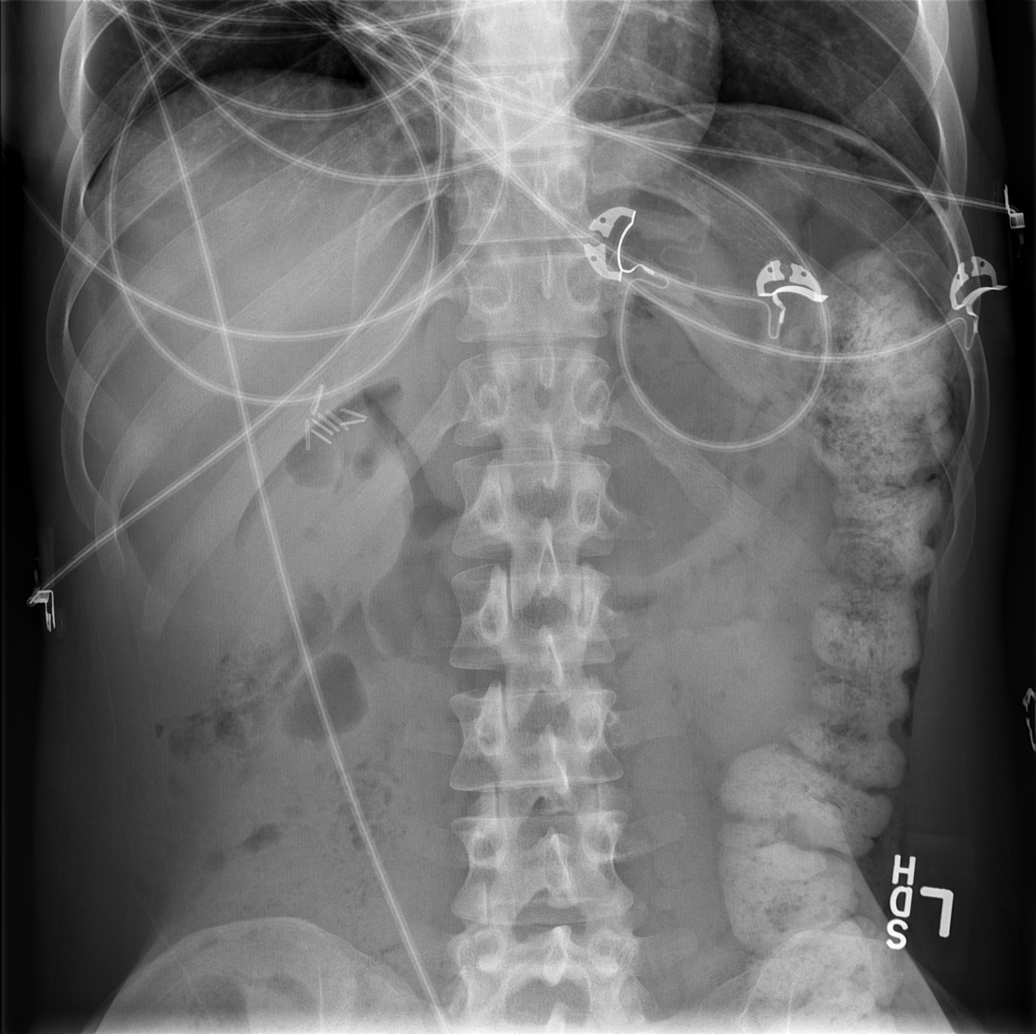

[4 of 4 positions shown; findings below may reference images not displayed]

FINDINGS: Good lung volumes, but moderate volume of
pneumoperitoneum subjacent to the diaphragm.  No pneumothorax or
evidence of pneumomediastinum.

Other mediastinal contours are within normal limits.  Visualized
tracheal air column is within normal limits.  The lungs are clear.

Oral contrast mixed with stool in the left colon. Nonobstructed
bowel gas pattern.  Right upper quadrant surgical clips with
evidence of gas in the gallbladder fossa or under the liver. No
acute osseous abnormality identified.
IMPRESSION: 1.  Moderate volume of pneumoperitoneum, seems beyond that expected
for 7 days postop from cholecystectomy.
2.  Nonobstructed bowel gas pattern, contrast mixed with stool in
the left colon.
3. No acute cardiopulmonary abnormality.

## 2011-11-22 MED ORDER — SODIUM CHLORIDE 0.9 % IV SOLN
1000.0000 mL | INTRAVENOUS | Status: DC
  Administered 2011-11-22: 1000 mL via INTRAVENOUS

## 2011-11-22 MED ORDER — SODIUM CHLORIDE 0.9 % IV SOLN
1000.0000 mL | Freq: Once | INTRAVENOUS | Status: AC
  Administered 2011-11-22: 1000 mL via INTRAVENOUS

## 2011-11-22 MED ORDER — ONDANSETRON HCL 4 MG/2ML IJ SOLN
4.0000 mg | Freq: Once | INTRAMUSCULAR | Status: AC
  Administered 2011-11-22: 4 mg via INTRAVENOUS

## 2011-11-22 MED ORDER — ONDANSETRON HCL 4 MG/2ML IJ SOLN
INTRAMUSCULAR | Status: AC
  Administered 2011-11-22: 4 mg via INTRAVENOUS
  Filled 2011-11-22: qty 2

## 2011-11-22 MED ORDER — SODIUM CHLORIDE 0.9 % IV BOLUS (SEPSIS)
1000.0000 mL | Freq: Once | INTRAVENOUS | Status: AC
  Administered 2011-11-22: 1000 mL via INTRAVENOUS

## 2011-11-22 MED ORDER — IOHEXOL 300 MG/ML  SOLN
100.0000 mL | Freq: Once | INTRAMUSCULAR | Status: AC | PRN
  Administered 2011-11-22: 100 mL via INTRAVENOUS

## 2011-11-22 MED ORDER — HYDROMORPHONE HCL PF 1 MG/ML IJ SOLN
1.0000 mg | Freq: Once | INTRAMUSCULAR | Status: AC
  Administered 2011-11-22: 1 mg via INTRAVENOUS
  Filled 2011-11-22: qty 1

## 2011-11-22 MED ORDER — PIPERACILLIN-TAZOBACTAM 3.375 G IVPB
3.3750 g | Freq: Once | INTRAVENOUS | Status: AC
  Administered 2011-11-22: 3.375 g via INTRAVENOUS
  Filled 2011-11-22: qty 50

## 2011-11-22 NOTE — ED Notes (Signed)
Patient transported to CT 

## 2011-11-22 NOTE — ED Notes (Signed)
Pt c/o of abd pain/N/V. Pt had gall blader remover on 8/16. Pain is 8/10.HR130. pwd

## 2011-11-22 NOTE — ED Provider Notes (Addendum)
History     CSN: [id number]  Arrival date & time 11/22/11  1904   First MD Initiated Contact with Patient 11/22/11 2008      Chief Complaint  Patient presents with  . Abdominal Pain    The history is provided by the patient.   patient reports a laparoscopic cholecystectomy 7 days ago by Dr. gross for gallbladder dysfunction.  She reports she was doing well and then developed mild upper abdominal pain yesterday that acutely worsened today.  She's had nausea without vomiting.  She denies fevers or chills.  She reports her pain is 8/10 on arrival to the emergency department.  Her heart rate was 140 on arrival.  She denies chest pain reports mild shortness of breath today.  She denies dysuria urinary frequency.  She has no lower abdominal pain.  Past Medical History  Diagnosis Date  . Diabetes mellitus   . Hypertension   . Diabetes type 1, uncontrolled 11/14/2011    Since age 76    Past Surgical History  Procedure Date  . Ankle surgery   . Cholecystectomy 11/15/2011    Procedure: LAPAROSCOPIC CHOLECYSTECTOMY WITH INTRAOPERATIVE CHOLANGIOGRAM;  Surgeon: [name], MD;  Location: [facility];  Service: General;  Laterality: N/A;    No family history on file.  History  Substance Use Topics  . Smoking status: Never Smoker   . Smokeless tobacco: Never Used  . Alcohol Use: No     Occasional    OB History    Grav Para Term Preterm Abortions TAB SAB Ect Mult Living                  Review of Systems  All other systems reviewed and are negative.    Allergies  Review of patient's allergies indicates no known allergies.  Home Medications   Current Outpatient Rx  Name Route Sig Dispense Refill  . ASPIRIN 325 MG PO TABS Oral Take 325 mg by mouth daily.    . ENALAPRIL MALEATE 10 MG PO TABS Oral Take 10 mg by mouth every evening.     Marland Kitchen HYDROCODONE-ACETAMINOPHEN 5-325 MG PO TABS Oral Take 1 tablet by mouth every 6 (six) hours as needed. Pain    . INSULIN ASPART 100 UNIT/ML [place]  SOLN Subcutaneous Inject 0-15 Units into the skin 3 (three) times daily before meals. 5 u with each meal + sliding scale: CBG 70 - 120: 0 u: CBG 121 - 150: 2 u; CBG 151 - 200: 3 u; CBG 201 - 250: 5 u; CBG 251 - 300: 8 u;CBG 301 - 350: 11 u; CBG 351 - 400: 15 u; CBG > 400 Call MD    . INSULIN GLARGINE 100 UNIT/ML [place] SOLN Subcutaneous Inject 30 Units into the skin at bedtime.     Marland Kitchen METFORMIN HCL 500 MG PO TABS Oral Take 500 mg by mouth 2 (two) times daily with a meal.    . METOCLOPRAMIDE HCL 10 MG PO TABS Oral Take 10 mg by mouth 4 (four) times daily.    . ADULT MULTIVITAMIN W/MINERALS CH Oral Take 1 tablet by mouth daily.    Marland Kitchen OMEPRAZOLE 20 MG PO CPDR Oral Take 20 mg by mouth daily.      BP 152/103  Pulse 106  Temp 98.2 F (36.8 C) (Oral)  Resp 17  Ht 5' 7"  (1.702 m)  Wt 115 lb (52.164 kg)  BMI 18.01 kg/m2  SpO2 100%  LMP 10/16/2011  Physical Exam  Nursing note and  vitals reviewed. Constitutional: She is oriented to person, place, and time. She appears well-developed and well-nourished. No distress.  HENT:  Head: Normocephalic and atraumatic.  Eyes: EOM are normal.  Neck: Normal range of motion.  Cardiovascular: Normal rate, regular rhythm and normal heart sounds.   Pulmonary/Chest: Effort normal and breath sounds normal.  Abdominal: Soft. She exhibits no distension.       Upper abdominal tenderness with guarding no rebound  Musculoskeletal: Normal range of motion.  Neurological: She is alert and oriented to person, place, and time.  Skin: Skin is warm and dry.  Psychiatric: She has a normal mood and affect. Judgment normal.    ED Course  Procedures (including critical care time)  Labs Reviewed  CBC - Abnormal; Notable for the following:    MCHC 37.0 (*)  PRE-WARMING TECHNIQUE USED   All other components within normal limits  COMPREHENSIVE METABOLIC PANEL - Abnormal; Notable for the following:    Chloride 94 (*)     Glucose, Bld 284 (*)     Creatinine, Ser 0.41 (*)      All other components within normal limits  URINALYSIS, ROUTINE W REFLEX MICROSCOPIC - Abnormal; Notable for the following:    APPearance CLOUDY (*)     Glucose, UA 500 (*)     Bilirubin Urine SMALL (*)     Ketones, ur 15 (*)     All other components within normal limits  GLUCOSE, CAPILLARY - Abnormal; Notable for the following:    Glucose-Capillary 288 (*)     All other components within normal limits  LIPASE, BLOOD  PREGNANCY, URINE   Dg Abd Acute W/chest  11/22/2011  *RADIOLOGY REPORT*  Clinical Data: 21 year old female with epigastric pain. Gallbladder surgery 7 days ago.  ACUTE ABDOMEN SERIES (ABDOMEN 2 VIEW & CHEST 1 VIEW)  Comparison: Intraoperative cholangiogram 11/15/2011 and earlier.  Findings: Good lung volumes, but moderate volume of pneumoperitoneum subjacent to the diaphragm.  No pneumothorax or evidence of pneumomediastinum.  Other mediastinal contours are within normal limits.  Visualized tracheal air column is within normal limits.  The lungs are clear.  Oral contrast mixed with stool in the left colon. Nonobstructed bowel gas pattern.  Right upper quadrant surgical clips with evidence of gas in the gallbladder fossa or under the liver. No acute osseous abnormality identified.  IMPRESSION: 1.  Moderate volume of pneumoperitoneum, seems beyond that expected for 7 days postop from cholecystectomy. 2.  Nonobstructed bowel gas pattern, contrast mixed with stool in the left colon. 3. No acute cardiopulmonary abnormality.   Original Report Authenticated By: Randall An, M.D.     I personally reviewed the imaging tests through PACS system  I reviewed available ER/hospitalization records thought the EMR   1. Pneumoperitoneum      Date: 11/23/2011  Rate: 141  Rhythm: sinus tachycardia  QRS Axis: normal  Intervals: normal  ST/T Wave abnormalities: normal  Conduction Disutrbances: none  Narrative Interpretation:   Old EKG Reviewed: tachyed     MDM  10:12 PM Call to  general surgery for pneumoperitoneum.  She does have symptoms concerning for perforation given her severity of abdominal pain nausea vomiting and tachycardia to 140.  Zosyn will be given now.        Hoy Morn, MD 11/22/11 [place], MD 11/23/11 [phone]

## 2011-11-23 ENCOUNTER — Encounter (HOSPITAL_COMMUNITY): Admission: EM | Disposition: A | Discharge status: Home / Self Care | Payer: Self-pay | Source: Home / Self Care

## 2011-11-23 ENCOUNTER — Encounter (HOSPITAL_COMMUNITY): Payer: Self-pay | Admitting: Anesthesiology

## 2011-11-23 ENCOUNTER — Emergency Department (HOSPITAL_COMMUNITY): Payer: 59 | Admitting: Anesthesiology

## 2011-11-23 DIAGNOSIS — R109 Unspecified abdominal pain: Secondary | ICD-10-CM

## 2011-11-23 DIAGNOSIS — K668 Other specified disorders of peritoneum: Secondary | ICD-10-CM

## 2011-11-23 HISTORY — PX: LAPAROSCOPY: SHX197

## 2011-11-23 LAB — GLUCOSE, CAPILLARY
Glucose-Capillary: 108 mg/dL — ABNORMAL HIGH (ref 70–99)
Glucose-Capillary: 111 mg/dL — ABNORMAL HIGH (ref 70–99)
Glucose-Capillary: 129 mg/dL — ABNORMAL HIGH (ref 70–99)
Glucose-Capillary: 133 mg/dL — ABNORMAL HIGH (ref 70–99)
Glucose-Capillary: 138 mg/dL — ABNORMAL HIGH (ref 70–99)
Glucose-Capillary: 146 mg/dL — ABNORMAL HIGH (ref 70–99)
Glucose-Capillary: 186 mg/dL — ABNORMAL HIGH (ref 70–99)
Glucose-Capillary: 202 mg/dL — ABNORMAL HIGH (ref 70–99)

## 2011-11-23 SURGERY — LAPAROSCOPY, DIAGNOSTIC
Anesthesia: General | Site: Abdomen | Wound class: Clean

## 2011-11-23 MED ORDER — POTASSIUM CHLORIDE IN NACL 20-0.9 MEQ/L-% IV SOLN
INTRAVENOUS | Status: DC
  Administered 2011-11-23 – 2011-11-25 (×5): via INTRAVENOUS
  Filled 2011-11-23 (×8): qty 1000

## 2011-11-23 MED ORDER — SODIUM CHLORIDE 0.9 % IV SOLN
INTRAVENOUS | Status: DC | PRN
  Administered 2011-11-23: 01:00:00 via INTRAVENOUS

## 2011-11-23 MED ORDER — CISATRACURIUM BESYLATE (PF) 10 MG/5ML IV SOLN
INTRAVENOUS | Status: DC | PRN
  Administered 2011-11-23: 6 mg via INTRAVENOUS

## 2011-11-23 MED ORDER — MIDAZOLAM HCL 5 MG/5ML IJ SOLN
INTRAMUSCULAR | Status: DC | PRN
  Administered 2011-11-23: 2 mg via INTRAVENOUS

## 2011-11-23 MED ORDER — LACTATED RINGERS IV SOLN
INTRAVENOUS | Status: DC | PRN
  Administered 2011-11-23 (×2): via INTRAVENOUS

## 2011-11-23 MED ORDER — PHENOL 1.4 % MT LIQD
1.0000 | OROMUCOSAL | Status: DC | PRN
  Filled 2011-11-23: qty 177

## 2011-11-23 MED ORDER — 0.9 % SODIUM CHLORIDE (POUR BTL) OPTIME
TOPICAL | Status: DC | PRN
  Administered 2011-11-23: 2000 mL

## 2011-11-23 MED ORDER — PANTOPRAZOLE SODIUM 40 MG IV SOLR
40.0000 mg | Freq: Every day | INTRAVENOUS | Status: DC
  Administered 2011-11-23: 40 mg via INTRAVENOUS
  Filled 2011-11-23 (×2): qty 40

## 2011-11-23 MED ORDER — ONDANSETRON HCL 4 MG/2ML IJ SOLN
INTRAMUSCULAR | Status: DC | PRN
  Administered 2011-11-23: 4 mg via INTRAVENOUS

## 2011-11-23 MED ORDER — BUPIVACAINE-EPINEPHRINE 0.25% -1:200000 IJ SOLN
INTRAMUSCULAR | Status: DC | PRN
  Administered 2011-11-23: 10 mL

## 2011-11-23 MED ORDER — LACTATED RINGERS IV SOLN
INTRAVENOUS | Status: DC | PRN
  Administered 2011-11-23: 02:00:00 via INTRAVENOUS

## 2011-11-23 MED ORDER — ONDANSETRON HCL 4 MG/2ML IJ SOLN
4.0000 mg | Freq: Four times a day (QID) | INTRAMUSCULAR | Status: DC | PRN
  Administered 2011-11-23 – 2011-12-05 (×14): 4 mg via INTRAVENOUS
  Filled 2011-11-23 (×19): qty 2

## 2011-11-23 MED ORDER — GLYCOPYRROLATE 0.2 MG/ML IJ SOLN
INTRAMUSCULAR | Status: DC | PRN
  Administered 2011-11-23: 0.4 mg via INTRAVENOUS

## 2011-11-23 MED ORDER — MENTHOL 3 MG MT LOZG
1.0000 | LOZENGE | OROMUCOSAL | Status: DC | PRN
  Administered 2011-11-25: 3 mg via ORAL
  Filled 2011-11-23 (×3): qty 9

## 2011-11-23 MED ORDER — HYDROMORPHONE HCL PF 1 MG/ML IJ SOLN
INTRAMUSCULAR | Status: AC
  Filled 2011-11-23: qty 1

## 2011-11-23 MED ORDER — HEPARIN SODIUM (PORCINE) 5000 UNIT/ML IJ SOLN
5000.0000 [IU] | Freq: Three times a day (TID) | INTRAMUSCULAR | Status: DC
  Administered 2011-11-24 – 2011-12-04 (×10): 5000 [IU] via SUBCUTANEOUS
  Filled 2011-11-23 (×41): qty 1

## 2011-11-23 MED ORDER — INSULIN ASPART 100 UNIT/ML ~~LOC~~ SOLN
SUBCUTANEOUS | Status: AC
  Filled 2011-11-23: qty 5

## 2011-11-23 MED ORDER — PIPERACILLIN-TAZOBACTAM 3.375 G IVPB
3.3750 g | Freq: Three times a day (TID) | INTRAVENOUS | Status: DC
  Administered 2011-11-23 – 2011-11-24 (×4): 3.375 g via INTRAVENOUS
  Filled 2011-11-23 (×6): qty 50

## 2011-11-23 MED ORDER — BUPIVACAINE-EPINEPHRINE PF 0.25-1:200000 % IJ SOLN
INTRAMUSCULAR | Status: AC
  Filled 2011-11-23: qty 30

## 2011-11-23 MED ORDER — HYDROMORPHONE HCL PF 1 MG/ML IJ SOLN
INTRAMUSCULAR | Status: AC
  Administered 2011-11-24: 0.5 mg via INTRAVENOUS
  Filled 2011-11-23: qty 1

## 2011-11-23 MED ORDER — LACTATED RINGERS IR SOLN
Status: DC | PRN
  Administered 2011-11-23: 1000 mL

## 2011-11-23 MED ORDER — FENTANYL CITRATE 0.05 MG/ML IJ SOLN
INTRAMUSCULAR | Status: DC | PRN
  Administered 2011-11-23: 100 ug via INTRAVENOUS
  Administered 2011-11-23: 50 ug via INTRAVENOUS

## 2011-11-23 MED ORDER — LACTATED RINGERS IV SOLN: INTRAVENOUS | Status: DC

## 2011-11-23 MED ORDER — PROPOFOL 10 MG/ML IV EMUL
INTRAVENOUS | Status: DC | PRN
  Administered 2011-11-23: 100 mg via INTRAVENOUS

## 2011-11-23 MED ORDER — ONDANSETRON HCL 4 MG PO TABS: 4.0000 mg | ORAL_TABLET | Freq: Four times a day (QID) | ORAL | Status: DC | PRN

## 2011-11-23 MED ORDER — INSULIN ASPART 100 UNIT/ML ~~LOC~~ SOLN
0.0000 [IU] | SUBCUTANEOUS | Status: DC
  Administered 2011-11-23 (×2): 2 [IU] via SUBCUTANEOUS
  Administered 2011-11-23: 3 [IU] via SUBCUTANEOUS
  Administered 2011-11-23 – 2011-11-25 (×4): 2 [IU] via SUBCUTANEOUS

## 2011-11-23 MED ORDER — INSULIN REGULAR HUMAN 100 UNIT/ML IJ SOLN
INTRAMUSCULAR | Status: DC | PRN
  Administered 2011-11-23: 5 [IU] via SUBCUTANEOUS

## 2011-11-23 MED ORDER — INSULIN GLARGINE 100 UNIT/ML ~~LOC~~ SOLN
15.0000 [IU] | Freq: Every day | SUBCUTANEOUS | Status: DC
  Administered 2011-11-23 – 2011-11-24 (×2): 15 [IU] via SUBCUTANEOUS

## 2011-11-23 MED ORDER — MORPHINE SULFATE 2 MG/ML IJ SOLN
2.0000 mg | INTRAMUSCULAR | Status: DC | PRN
  Administered 2011-11-23: 2 mg via INTRAVENOUS
  Administered 2011-11-23: 4 mg via INTRAVENOUS
  Administered 2011-11-23: 2 mg via INTRAVENOUS
  Filled 2011-11-23 (×2): qty 1
  Filled 2011-11-23: qty 2

## 2011-11-23 MED ORDER — HYDROMORPHONE HCL PF 1 MG/ML IJ SOLN
0.5000 mg | INTRAMUSCULAR | Status: DC | PRN
  Administered 2011-11-23 – 2011-11-24 (×10): 0.5 mg via INTRAVENOUS
  Filled 2011-11-23 (×9): qty 1

## 2011-11-23 MED ORDER — HYDROMORPHONE HCL PF 1 MG/ML IJ SOLN
0.2500 mg | INTRAMUSCULAR | Status: DC | PRN
  Administered 2011-11-23 (×4): 0.5 mg via INTRAVENOUS

## 2011-11-23 MED ORDER — INSULIN ASPART 100 UNIT/ML ~~LOC~~ SOLN
0.0000 [IU] | SUBCUTANEOUS | Status: DC
Start: 2011-11-23 — End: 2011-11-23

## 2011-11-23 MED ORDER — NEOSTIGMINE METHYLSULFATE 1 MG/ML IJ SOLN
INTRAMUSCULAR | Status: DC | PRN
  Administered 2011-11-23: 4 mg via INTRAVENOUS

## 2011-11-23 MED ORDER — ONDANSETRON HCL 4 MG/2ML IJ SOLN
4.0000 mg | Freq: Once | INTRAMUSCULAR | Status: AC
  Administered 2011-11-23: 4 mg via INTRAVENOUS
  Filled 2011-11-23: qty 2

## 2011-11-23 SURGICAL SUPPLY — 51 items
APPLICATOR COTTON TIP 6IN STRL (MISCELLANEOUS) IMPLANT
BENZOIN TINCTURE PRP APPL 2/3 (GAUZE/BANDAGES/DRESSINGS) IMPLANT
BLADE EXTENDED COATED 6.5IN (ELECTRODE) IMPLANT
BLADE HEX COATED 2.75 (ELECTRODE) ×3 IMPLANT
CANISTER SUCTION 2500CC (MISCELLANEOUS) ×3 IMPLANT
CANNULA ENDOPATH XCEL 11M (ENDOMECHANICALS) IMPLANT
CLOTH BEACON ORANGE TIMEOUT ST (SAFETY) ×3 IMPLANT
COVER MAYO STAND STRL (DRAPES) IMPLANT
DECANTER SPIKE VIAL GLASS SM (MISCELLANEOUS) IMPLANT
DERMABOND ADVANCED (GAUZE/BANDAGES/DRESSINGS) ×1
DERMABOND ADVANCED .7 DNX12 (GAUZE/BANDAGES/DRESSINGS) ×2 IMPLANT
DRAIN CHANNEL RND F F (WOUND CARE) ×3 IMPLANT
DRAPE LAPAROSCOPIC ABDOMINAL (DRAPES) ×3 IMPLANT
DRAPE WARM FLUID 44X44 (DRAPE) IMPLANT
ELECT REM PT RETURN 9FT ADLT (ELECTROSURGICAL) ×3
ELECTRODE REM PT RTRN 9FT ADLT (ELECTROSURGICAL) ×2 IMPLANT
EVACUATOR SILICONE 100CC (DRAIN) ×3 IMPLANT
GLOVE BIOGEL PI IND STRL 7.0 (GLOVE) ×2 IMPLANT
GLOVE BIOGEL PI INDICATOR 7.0 (GLOVE) ×1
GOWN STRL NON-REIN LRG LVL3 (GOWN DISPOSABLE) ×3 IMPLANT
GOWN STRL REIN XL XLG (GOWN DISPOSABLE) ×6 IMPLANT
KIT BASIN OR (CUSTOM PROCEDURE TRAY) ×3 IMPLANT
NS IRRIG 1000ML POUR BTL (IV SOLUTION) ×6 IMPLANT
PACK GENERAL/GYN (CUSTOM PROCEDURE TRAY) IMPLANT
SCALPEL HARMONIC ACE (MISCELLANEOUS) IMPLANT
SET IRRIG TUBING LAPAROSCOPIC (IRRIGATION / IRRIGATOR) ×3 IMPLANT
SOLUTION ANTI FOG 6CC (MISCELLANEOUS) ×3 IMPLANT
SPONGE GAUZE 4X4 12PLY (GAUZE/BANDAGES/DRESSINGS) ×3 IMPLANT
SPONGE LAP 18X18 X RAY DECT (DISPOSABLE) IMPLANT
STAPLER VISISTAT 35W (STAPLE) ×3 IMPLANT
STRIP CLOSURE SKIN 1/2X4 (GAUZE/BANDAGES/DRESSINGS) IMPLANT
SUCTION POOLE TIP (SUCTIONS) ×3 IMPLANT
SUT ETHILON 3 0 PS 1 (SUTURE) ×3 IMPLANT
SUT MNCRL AB 4-0 PS2 18 (SUTURE) ×3 IMPLANT
SUT PDS AB 1 CTX 36 (SUTURE) IMPLANT
SUT SILK 2 0 (SUTURE)
SUT SILK 2 0 SH CR/8 (SUTURE) ×3 IMPLANT
SUT SILK 2-0 18XBRD TIE 12 (SUTURE) IMPLANT
SUT SILK 3 0 (SUTURE)
SUT SILK 3 0 SH CR/8 (SUTURE) ×3 IMPLANT
SUT SILK 3-0 18XBRD TIE 12 (SUTURE) IMPLANT
SUT VIC AB 4-0 PS2 27 (SUTURE) IMPLANT
TAPE CLOTH SURG 4X10 WHT LF (GAUZE/BANDAGES/DRESSINGS) ×3 IMPLANT
TOWEL OR 17X26 10 PK STRL BLUE (TOWEL DISPOSABLE) ×6 IMPLANT
TRAY FOLEY CATH 14FRSI W/METER (CATHETERS) ×3 IMPLANT
TRAY LAP CHOLE (CUSTOM PROCEDURE TRAY) ×3 IMPLANT
TROCAR BLADELESS OPT 5 75 (ENDOMECHANICALS) ×6 IMPLANT
TROCAR XCEL BLUNT TIP 100MML (ENDOMECHANICALS) ×3 IMPLANT
TROCAR XCEL NON-BLD 11X100MML (ENDOMECHANICALS) IMPLANT
TUBING INSUFFLATION 10FT LAP (TUBING) ×3 IMPLANT
YANKAUER SUCT BULB TIP NO VENT (SUCTIONS) ×3 IMPLANT

## 2011-11-23 NOTE — H&P (Signed)
[name] is an 21 y.o. female.   Chief Complaint: abdominal pain HPI: patient is a 21 year old insulin-dependent diabetic who one week ago underwent an apparently uneventful laparoscopic cholecystectomy with cholangiogram. She had presented with recurrent and persistent abdominal pain with workup including negative CT scan and HIDA scan showing some evidence of cystic duct obstruction. Her gallbladder appeared somewhat chronically inflamed per the operative note. No other abnormalities were noted in the right or difficulties during surgery. She states she felt okay for a couple days after discharge and has had 2 or 3 days of recurrent epigastric pain similar to what she had before surgery. Today her pain became much more severe and diffuse in her abdomen. No nausea vomiting. No fever or chills. No urinary symptoms appear She presented to the emergency room. On questioning she takes occasional full-strength aspirin as well as fairly frequent Advil.  Past Medical History  Diagnosis Date  . Diabetes mellitus   . Hypertension   . Diabetes type 1, uncontrolled 11/14/2011    Since age 40    Past Surgical History  Procedure Date  . Ankle surgery   . Cholecystectomy 11/15/2011    Procedure: LAPAROSCOPIC CHOLECYSTECTOMY WITH INTRAOPERATIVE CHOLANGIOGRAM;  Surgeon: [name], MD;  Location: [facility];  Service: General;  Laterality: N/A;    No family history on file. Social History:  reports that she has never smoked. She has never used smokeless tobacco. She reports that she does not drink alcohol or use illicit drugs.  Allergies: No Known Allergies  Current Facility-Administered Medications  Medication Dose Route Frequency Provider Last Rate Last Dose  . 0.9 %  sodium chloride infusion  1,000 mL Intravenous Once Hoy Morn, MD 999 mL/hr at 11/22/11 2023 1,000 mL at 11/22/11 2023   Followed by  . 0.9 %  sodium chloride infusion  1,000 mL Intravenous Continuous Hoy Morn, MD 125  mL/hr at 11/22/11 2256 1,000 mL at 11/22/11 2256  . HYDROmorphone (DILAUDID) injection 1 mg  1 mg Intravenous Once Hoy Morn, MD   1 mg at 11/22/11 2023  . iohexol (OMNIPAQUE) 300 MG/ML solution 100 mL  100 mL Intravenous Once PRN Medication Radiologist, MD   100 mL at 11/22/11 2241  . ondansetron (ZOFRAN) injection 4 mg  4 mg Intravenous Once Hoy Morn, MD   4 mg at 11/22/11 2024  . piperacillin-tazobactam (ZOSYN) IVPB 3.375 g  3.375 g Intravenous Once Hoy Morn, MD   3.375 g at 11/22/11 2256  . sodium chloride 0.9 % bolus 1,000 mL  1,000 mL Intravenous Once Hoy Morn, MD   1,000 mL at 11/22/11 2004   Current Outpatient Prescriptions  Medication Sig Dispense Refill  . aspirin 325 MG tablet Take 325 mg by mouth daily.      . enalapril (VASOTEC) 10 MG tablet Take 10 mg by mouth every evening.       Marland Kitchen HYDROcodone-acetaminophen (NORCO/VICODIN) 5-325 MG per tablet Take 1 tablet by mouth every 6 (six) hours as needed. Pain      . insulin aspart (NOVOLOG) 100 UNIT/ML injection Inject 0-15 Units into the skin 3 (three) times daily before meals. 5 u with each meal + sliding scale: CBG 70 - 120: 0 u: CBG 121 - 150: 2 u; CBG 151 - 200: 3 u; CBG 201 - 250: 5 u; CBG 251 - 300: 8 u;CBG 301 - 350: 11 u; CBG 351 - 400: 15 u; CBG > 400 Call MD      .  insulin glargine (LANTUS) 100 UNIT/ML injection Inject 30 Units into the skin at bedtime.       . metFORMIN (GLUCOPHAGE) 500 MG tablet Take 500 mg by mouth 2 (two) times daily with a meal.      . metoCLOPramide (REGLAN) 10 MG tablet Take 10 mg by mouth 4 (four) times daily.      . Multiple Vitamin (MULTIVITAMIN WITH MINERALS) TABS Take 1 tablet by mouth daily.      Marland Kitchen omeprazole (PRILOSEC) 20 MG capsule Take 20 mg by mouth daily.         Results for orders placed during the hospital encounter of 11/22/11 (from the past 48 hour(s))  CBC     Status: Abnormal   Collection Time   11/22/11  7:50 PM      Component Value Range Comment   WBC 4.0  4.0  - 10.5 K/uL    RBC 4.55  3.87 - 5.11 MIL/uL    Hemoglobin 13.9  12.0 - 15.0 g/dL    HCT 37.6  36.0 - 46.0 %    MCV 82.6  78.0 - 100.0 fL    MCH 30.5  26.0 - 34.0 pg    MCHC 37.0 (*) 30.0 - 36.0 g/dL PRE-WARMING TECHNIQUE USED   RDW 11.6  11.5 - 15.5 %    Platelets 275  150 - 400 K/uL   COMPREHENSIVE METABOLIC PANEL     Status: Abnormal   Collection Time   11/22/11  7:50 PM      Component Value Range Comment   Sodium 135  135 - 145 mEq/L    Potassium 3.6  3.5 - 5.1 mEq/L    Chloride 94 (*) 96 - 112 mEq/L    CO2 28  19 - 32 mEq/L    Glucose, Bld 284 (*) 70 - 99 mg/dL    BUN 11  6 - 23 mg/dL    Creatinine, Ser 0.41 (*) 0.50 - 1.10 mg/dL    Calcium 9.7  8.4 - 10.5 mg/dL    Total Protein 7.7  6.0 - 8.3 g/dL    Albumin 4.1  3.5 - 5.2 g/dL    AST 14  0 - 37 U/L    ALT 14  0 - 35 U/L    Alkaline Phosphatase 57  39 - 117 U/L    Total Bilirubin 0.5  0.3 - 1.2 mg/dL    GFR calc non Af Amer >90  >90 mL/min    GFR calc Af Amer >90  >90 mL/min   LIPASE, BLOOD     Status: Normal   Collection Time   11/22/11  7:50 PM      Component Value Range Comment   Lipase 31  11 - 59 U/L   URINALYSIS, ROUTINE W REFLEX MICROSCOPIC     Status: Abnormal   Collection Time   11/22/11  7:52 PM      Component Value Range Comment   Color, Urine YELLOW  YELLOW    APPearance CLOUDY (*) CLEAR    Specific Gravity, Urine 1.028  1.005 - 1.030    pH 6.0  5.0 - 8.0    Glucose, UA 500 (*) NEGATIVE mg/dL    Hgb urine dipstick NEGATIVE  NEGATIVE    Bilirubin Urine SMALL (*) NEGATIVE    Ketones, ur 15 (*) NEGATIVE mg/dL    Protein, ur NEGATIVE  NEGATIVE mg/dL    Urobilinogen, UA 1.0  0.0 - 1.0 mg/dL    Nitrite NEGATIVE  NEGATIVE  Leukocytes, UA NEGATIVE  NEGATIVE MICROSCOPIC NOT DONE ON URINES WITH NEGATIVE PROTEIN, BLOOD, LEUKOCYTES, NITRITE, OR GLUCOSE <1000 mg/dL.  PREGNANCY, URINE     Status: Normal   Collection Time   11/22/11  7:52 PM      Component Value Range Comment   Preg Test, Ur NEGATIVE  NEGATIVE     GLUCOSE, CAPILLARY     Status: Abnormal   Collection Time   11/22/11  8:18 PM      Component Value Range Comment   Glucose-Capillary 288 (*) 70 - 99 mg/dL    Ct Abdomen Pelvis W Contrast  11/22/2011  *RADIOLOGY REPORT*  Clinical Data: Mid abdominal pain  CT ABDOMEN AND PELVIS WITH CONTRAST  Technique:  Multidetector CT imaging of the abdomen and pelvis was performed following the standard protocol during bolus administration of intravenous contrast.  Contrast: 151m OMNIPAQUE IOHEXOL 300 MG/ML  SOLN  Comparison: 11/22/2011 radiograph, 11/11/2011 CT  Findings: Limited images through the lung bases demonstrate no significant appreciable abnormality. The heart size is within normal limits. No pleural or pericardial effusion.  Significant amount of pneumoperitoneum.  Interval cholecystectomy. Small amount of fluid and air at the surgical bed.  No loculated fluid collection.  Mild intrahepatic biliary ductal prominence to the level of the ampulla.  Mild main pancreatic duct dilatation is similar to prior.  Unremarkable spleen, adrenal glands, and kidneys.  No hydronephrosis or hydroureter.  Contrast within the colon from the previous examination. No bowel obstruction.  Interloop gas along the left hemiabdomen.  Small amount of free fluid within the pelvis dependently measures slightly higher than simple fluid.  Distended, thin-walled bladder.  Unremarkable CT appearance to the uterus and adnexa.  Normal caliber aorta and branch vessels.  Postoperative changes periumbilical.  Subcutaneous fat stranding may be reactive or infectious. No associated fluid collection.  No acute osseous finding.  IMPRESSION: Extensive pneumoperitoneum.  The source of which is uncertain however bowel injury not excluded. The quantity is more than typically seen 1 week postoperative.  There is a small air and fluid collection in the surgical bed which is not necessarily unexpected in the recently postoperative state.  Small amount of  free fluid within the pelvis dependently measures slightly higher than simple fluid.   Original Report Authenticated By: ASuanne Marker M.D.    Dg Abd Acute W/chest  11/22/2011  *RADIOLOGY REPORT*  Clinical Data: 21year old female with epigastric pain. Gallbladder surgery 7 days ago.  ACUTE ABDOMEN SERIES (ABDOMEN 2 VIEW & CHEST 1 VIEW)  Comparison: Intraoperative cholangiogram 11/15/2011 and earlier.  Findings: Good lung volumes, but moderate volume of pneumoperitoneum subjacent to the diaphragm.  No pneumothorax or evidence of pneumomediastinum.  Other mediastinal contours are within normal limits.  Visualized tracheal air column is within normal limits.  The lungs are clear.  Oral contrast mixed with stool in the left colon. Nonobstructed bowel gas pattern.  Right upper quadrant surgical clips with evidence of gas in the gallbladder fossa or under the liver. No acute osseous abnormality identified.  IMPRESSION: 1.  Moderate volume of pneumoperitoneum, seems beyond that expected for 7 days postop from cholecystectomy. 2.  Nonobstructed bowel gas pattern, contrast mixed with stool in the left colon. 3. No acute cardiopulmonary abnormality.   Original Report Authenticated By: HRandall An M.D.     Review of Systems  Constitutional: Positive for malaise/fatigue. Negative for fever and chills.  Respiratory: Negative.   Cardiovascular: Positive for chest pain. Negative for palpitations and leg swelling.  Gastrointestinal:  Positive for nausea and abdominal pain. Negative for vomiting, diarrhea, constipation and blood in stool.  Genitourinary: Negative.     Blood pressure 152/103, pulse 106, temperature 98.2 F (36.8 C), temperature source Oral, resp. rate 17, height 5' 7"  (1.702 m), weight 115 lb (52.164 kg), last menstrual period 10/16/2011, SpO2 100.00%. Physical Exam  General: Thin African American female who appears in pain Skin: Warm and dry without rash or infection HEENT: Sclera  nonicteric. No masses. Oropharynx clear. Lymph nodes no cervical, supraclavicular, inguinal nodes palpable Lungs: Clear equal breath sounds without increased work of breathing Cardiovascular: Regular tachycardia. No murmur. No JVD or edema. Abdomen: Does not appear distended. Bowel sounds hypoactive. Well-healed laparoscopic incisions. There is moderate diffuse tenderness but not boardlike rigidity. No discernible masses or organomegaly. Extremities: Thin, no edema or joint swelling Neurologic: Alert and fully oriented. No gross motor deficits.  Assessment/Plan Worsening abdominal pain 1 week following laparoscopic cholecystectomy. Plain abdominal films and CT scan showed a significant amount of free air. The source is not clear. We obviously need to be concerned about GI perforation. Possibilities would include perforated ulcer or operative injury. This was all discussed with the patient and her husband. I have recommended proceeding with emergency laparoscopy and probable laparotomy. Rest of general anesthesia, bleeding, infection were discussed and they understand and agree.  She has received broad-spectrum IV antibiotics preoperatively.  Leeman Johnsey T 11/23/2011, 12:02 AM

## 2011-11-23 NOTE — Op Note (Signed)
Preoperative Diagnosis: Pneumoperitoneum [568.89] ABD PAIN, CHEST PAIN, POST SURGERY  Postoprative Diagnosis: Pneumoperitoneum [568.89] ABD PAIN, CHEST PAIN, POST SURGERY  Procedure: Procedure(s): LAPAROSCOPY DIAGNOSTIC   Surgeon: Excell Seltzer T   Assistants: Henri Medal M.D.  Anesthesia:  General endotracheal anesthesiaDiagnos  Indications:   Patient is a 21 year old female one week following laparoscopic cholecystectomy for biliary dyskinesia. She presents today with recurrent acute abdominal pain and tachycardia. Plain x-rays and CT scan has shown a significant amount of free intraperitoneal air although no contrast leak or apparent source. With this finding I recommended diagnostic laparoscopy and possible laparotomy for possible perforation.  Procedure Detail:  Patient is brought to the operating room, placed in the supine position on the operating table, and general endotracheal anesthesia induced. She was already on broad-spectrum IV antibiotics. Foley catheter and NG tube were placed. The abdomen was widely sterilely prepped and draped. [place] to skin of the previous 1 cm umbilical incision and dissection was carried down to the fascia bluntly and previous fascial sutures were removed. The fascial defect was dilated slightly and a Hassan trocar placed and pneumoperitoneum established. Laparoscopy showed a small amount of serosanguineous fluid and the paracolic gutters and in the pelvis. There was no bile staining or peritonitis or exudate. I placed 2 more 5 mm trochars through the previous sites in the left and right upper abdomen. The gallbladder fossa was adherent to the lesser omentum and that did not attempt to take all this down. We carefully examined the stomach and the first and second portion of the duodenum which all appeared normal without any unusual fluid or exudate. The third portion of the duodenum was behind the adhesions of the gallbladder fossa and could not be  completely visualized. We carefully examined the entire colon and the entire small bowel and there was no evidence of any leakage or bowel injury. The abdomen was thoroughly irrigated. I left a 17 Blake closed suction drain in Morison's pouch an adjacent to the gallbladder fossa and porta hepatis and this was brought out through the right upper quadrant 5 mm site. The assumption trocar and a 5 mm trocar removed and the fascial defect repaired with interrupted Vicryl. Skin incisions were closed with subcuticular Monocryl and Dermabond. Sponge needle and his counts were correct.  Findings: As above  Estimated Blood Loss:  Minimal         Drains: JACKSON-PRATT (JP)  Blood Given: none          Specimens: none        Complications:  * No complications entered in OR log *         Disposition: PACU - hemodynamically stable.         Condition: stable

## 2011-11-23 NOTE — Transfer of Care (Signed)
Immediate Anesthesia Transfer of Care Note  Patient: [name]  Procedure(s) Performed: Procedure(s) (LRB): LAPAROSCOPY DIAGNOSTIC (N/A)  Patient Location: PACU  Anesthesia Type: General  Level of Consciousness: awake, sedated and patient cooperative  Airway & Oxygen Therapy: Patient Spontanous Breathing and Patient connected to face mask oxygen  Post-op Assessment: Report given to PACU RN and Post -op Vital signs reviewed and stable  Post vital signs: Reviewed and stable  Complications: No apparent anesthesia complications

## 2011-11-23 NOTE — Progress Notes (Signed)
ANTIBIOTIC CONSULT NOTE - INITIAL  Pharmacy Consult for  Zosyn Indication: s/p surgery for pneumoperitoneum  No Known Allergies  Patient Measurements: Height: 5\' 7"  (170.2 cm) Weight: 115 lb (52.164 kg) IBW/kg (Calculated) : 61.6    Vital Signs: Temp: 97.5 F (36.4 C) (08/25 0330) Temp src: Oral (08/24 1939) BP: 136/95 mmHg (08/25 0330) Pulse Rate: 100  (08/25 0330) Intake/Output from previous day: 08/24 0701 - 08/25 0700 In: 5800 [I.V.:5800] Out: 356 [Urine:350; Drains:6] Intake/Output from this shift: Total I/O In: 5800 [I.V.:5800] Out: 356 [Urine:350; Drains:6]  Labs:  [facility] 11/22/11 1950  WBC 4.0  HGB 13.9  PLT 275  LABCREA --  CREATININE 0.41*   Estimated Creatinine Clearance: 90.9 ml/min (by C-G formula based on Cr of 0.41). No results found for this basename: VANCOTROUGH:2,VANCOPEAK:2,VANCORANDOM:2,GENTTROUGH:2,GENTPEAK:2,GENTRANDOM:2,TOBRATROUGH:2,TOBRAPEAK:2,TOBRARND:2,AMIKACINPEAK:2,AMIKACINTROU:2,AMIKACIN:2, in the last 72 hours   Microbiology: No results found for this or any previous visit (from the past 720 hour(s)).  Medical History: Past Medical History  Diagnosis Date  . Diabetes mellitus   . Hypertension   . Diabetes type 1, uncontrolled 11/14/2011    Since age 64    Medications:  Scheduled:    . sodium chloride  1,000 mL Intravenous Once  . heparin  5,000 Units Subcutaneous Q8H  . HYDROmorphone      . HYDROmorphone      .  HYDROmorphone (DILAUDID) injection  1 mg Intravenous Once  . insulin aspart  0-15 Units Subcutaneous Q4H  . insulin glargine  15 Units Subcutaneous QHS  . ondansetron (ZOFRAN) IV  4 mg Intravenous Once  . pantoprazole (PROTONIX) IV  40 mg Intravenous QHS  . piperacillin-tazobactam (ZOSYN)  IV  3.375 g Intravenous Once  . piperacillin-tazobactam (ZOSYN)  IV  3.375 g Intravenous Q8H  . sodium chloride  1,000 mL Intravenous Once  . DISCONTD: insulin aspart  0-15 Units Subcutaneous Q4H   Infusions:    . 0.9 %  NaCl with KCl 20 mEq / L    . DISCONTD: sodium chloride Stopped (11/23/11 0020)  . DISCONTD: lactated ringers    . DISCONTD: lactated ringers     Assessment: 21 yo female s/p cholecystectomy a week ago for biliary dyskinesia, presents today with recurrent abdominal pain and tachycardia.  Went to OR for pneumoperitoneum.  MD ordering Zosyn.  Goal of Therapy:  Treat infection  Plan:   Zosyn 3.375 Gm IV q8h. EI infusion  F/U SCr.  Lorenza Evangelist 11/23/2011,3:41 AM

## 2011-11-23 NOTE — Progress Notes (Signed)
Patient ID: [name], female   DOB: 1990-08-23, 21 y.o.   MRN: [id number] Day of Surgery  Subjective: Some abdominal pain, not severe.  No other C/O  Objective: Vital signs in last 24 hours: Temp:  [97 F (36.1 C)-98.3 F (36.8 C)] 98.3 F (36.8 C) (08/25 0600) Pulse Rate:  [92-140] 111  (08/25 0600) Resp:  [12-22] 12  (08/25 0600) BP: (133-153)/(87-110) 134/87 mmHg (08/25 0600) SpO2:  [98 %-100 %] 98 % (08/25 0600) Weight:  [115 lb (52.164 kg)-134 lb (60.782 kg)] 134 lb (60.782 kg) (08/25 0330) Last BM Date:  (pta)  Intake/Output from previous day: 08/24 0701 - 08/25 0700 In: 5800 [I.V.:5800] Out: 366 [Urine:350; Drains:16] Intake/Output this shift:    General appearance: alert and no distress GI: abnormal findings:  mild tenderness in the entire abdomen and non distended Incision/Wound: Dry.  JP drainage serous  Lab Results:   [facility] 11/22/11 1950  WBC 4.0  HGB 13.9  HCT 37.6  PLT 275   BMET  Basename 11/22/11 1950  NA 135  K 3.6  CL 94*  CO2 28  GLUCOSE 284*  BUN 11  CREATININE 0.41*  CALCIUM 9.7   . CBG (last 3)   Basename 11/23/11 0732 11/23/11 0437 11/23/11 0238  GLUCAP 138* 186* 146*     Studies/Results: Ct Abdomen Pelvis W Contrast  11/22/2011  *RADIOLOGY REPORT*  Clinical Data: Mid abdominal pain  CT ABDOMEN AND PELVIS WITH CONTRAST  Technique:  Multidetector CT imaging of the abdomen and pelvis was performed following the standard protocol during bolus administration of intravenous contrast.  Contrast: 130m OMNIPAQUE IOHEXOL 300 MG/ML  SOLN  Comparison: 11/22/2011 radiograph, 11/11/2011 CT  Findings: Limited images through the lung bases demonstrate no significant appreciable abnormality. The heart size is within normal limits. No pleural or pericardial effusion.  Significant amount of pneumoperitoneum.  Interval cholecystectomy. Small amount of fluid and air at the surgical bed.  No loculated fluid collection.  Mild intrahepatic biliary  ductal prominence to the level of the ampulla.  Mild main pancreatic duct dilatation is similar to prior.  Unremarkable spleen, adrenal glands, and kidneys.  No hydronephrosis or hydroureter.  Contrast within the colon from the previous examination. No bowel obstruction.  Interloop gas along the left hemiabdomen.  Small amount of free fluid within the pelvis dependently measures slightly higher than simple fluid.  Distended, thin-walled bladder.  Unremarkable CT appearance to the uterus and adnexa.  Normal caliber aorta and branch vessels.  Postoperative changes periumbilical.  Subcutaneous fat stranding may be reactive or infectious. No associated fluid collection.  No acute osseous finding.  IMPRESSION: Extensive pneumoperitoneum.  The source of which is uncertain however bowel injury not excluded. The quantity is more than typically seen 1 week postoperative.  There is a small air and fluid collection in the surgical bed which is not necessarily unexpected in the recently postoperative state.  Small amount of free fluid within the pelvis dependently measures slightly higher than simple fluid.   Original Report Authenticated By: ASuanne Marker M.D.    Dg Abd Acute W/chest  11/22/2011  *RADIOLOGY REPORT*  Clinical Data: 21year old female with epigastric pain. Gallbladder surgery 7 days ago.  ACUTE ABDOMEN SERIES (ABDOMEN 2 VIEW & CHEST 1 VIEW)  Comparison: Intraoperative cholangiogram 11/15/2011 and earlier.  Findings: Good lung volumes, but moderate volume of pneumoperitoneum subjacent to the diaphragm.  No pneumothorax or evidence of pneumomediastinum.  Other mediastinal contours are within normal limits.  Visualized tracheal air column  is within normal limits.  The lungs are clear.  Oral contrast mixed with stool in the left colon. Nonobstructed bowel gas pattern.  Right upper quadrant surgical clips with evidence of gas in the gallbladder fossa or under the liver. No acute osseous abnormality  identified.  IMPRESSION: 1.  Moderate volume of pneumoperitoneum, seems beyond that expected for 7 days postop from cholecystectomy. 2.  Nonobstructed bowel gas pattern, contrast mixed with stool in the left colon. 3. No acute cardiopulmonary abnormality.   Original Report Authenticated By: Randall An, M.D.     Anti-infectives: Anti-infectives     Start     Dose/Rate Route Frequency Ordered Stop   11/23/11 0800  piperacillin-tazobactam (ZOSYN) IVPB 3.375 g       3.375 g 12.5 mL/hr over 240 Minutes Intravenous Every 8 hours 11/23/11 0341     11/22/11 2230  piperacillin-tazobactam (ZOSYN) IVPB 3.375 g       3.375 g 100 mL/hr over 30 Minutes Intravenous  Once 11/22/11 2205 11/22/11 2326          Assessment/Plan: s/p Procedure(s): LAPAROSCOPY DIAGNOSTIC Free air, abdominal pain post lap chole No source identified, normal laparoscopy Continue NG, abx today.  Lab in AM Prob does not need prolonged course of abx OOB  LOS: 1 day    Cahterine Heinzel T 11/23/2011

## 2011-11-23 NOTE — Anesthesia Preprocedure Evaluation (Signed)
Anesthesia Evaluation  Patient identified by MRN, date of birth, ID band Patient awake    Reviewed: Allergy & Precautions, H&P , NPO status , Patient's Chart, lab work & pertinent test results  Airway Mallampati: II TM Distance: >3 FB Neck ROM: full    Dental No notable dental hx. (+) Teeth Intact and Dental Advisory Given   Pulmonary neg pulmonary ROS,  breath sounds clear to auscultation  Pulmonary exam normal       Cardiovascular Exercise Tolerance: Good hypertension, Pt. on medications negative cardio ROS  Rhythm:regular Rate:Normal     Neuro/Psych negative neurological ROS  negative psych ROS   GI/Hepatic negative GI ROS, Neg liver ROS,   Endo/Other  negative endocrine ROSPoorly Controlled, Type 1, Insulin Dependent  Renal/GU negative Renal ROS  negative genitourinary   Musculoskeletal   Abdominal   Peds  Hematology negative hematology ROS (+)   Anesthesia Other Findings   Reproductive/Obstetrics negative OB ROS                           Anesthesia Physical Anesthesia Plan  ASA: III  Anesthesia Plan: General   Post-op Pain Management:    Induction: Intravenous  Airway Management Planned: Oral ETT  Additional Equipment:   Intra-op Plan:   Post-operative Plan: Extubation in OR  Informed Consent: I have reviewed the patients History and Physical, chart, labs and discussed the procedure including the risks, benefits and alternatives for the proposed anesthesia with the patient or authorized representative who has indicated his/her understanding and acceptance.   Dental Advisory Given  Plan Discussed with: CRNA and Surgeon  Anesthesia Plan Comments:         Anesthesia Quick Evaluation

## 2011-11-23 NOTE — Anesthesia Postprocedure Evaluation (Signed)
  Anesthesia Post-op Note  Patient: [name]  Procedure(s) Performed: Procedure(s) (LRB): LAPAROSCOPY DIAGNOSTIC (N/A)  Patient Location: PACU  Anesthesia Type: General  Level of Consciousness: awake and alert   Airway and Oxygen Therapy: Patient Spontanous Breathing  Post-op Pain: mild  Post-op Assessment: Post-op Vital signs reviewed, Patient's Cardiovascular Status Stable, Respiratory Function Stable, Patent Airway and No signs of Nausea or vomiting  Post-op Vital Signs: stable  Complications: No apparent anesthesia complications

## 2011-11-24 ENCOUNTER — Encounter (HOSPITAL_COMMUNITY): Payer: Self-pay | Admitting: Surgery

## 2011-11-24 LAB — HEPATIC FUNCTION PANEL
ALT: 11 U/L (ref 0–35)
AST: 12 U/L (ref 0–37)
Albumin: 3.2 g/dL — ABNORMAL LOW (ref 3.5–5.2)
Alkaline Phosphatase: 46 U/L (ref 39–117)
Bilirubin, Direct: 0.1 mg/dL (ref 0.0–0.3)
Indirect Bilirubin: 0.3 mg/dL (ref 0.3–0.9)
Total Bilirubin: 0.4 mg/dL (ref 0.3–1.2)
Total Protein: 6.3 g/dL (ref 6.0–8.3)

## 2011-11-24 LAB — CBC
HCT: 31.2 % — ABNORMAL LOW (ref 36.0–46.0)
Hemoglobin: 11.2 g/dL — ABNORMAL LOW (ref 12.0–15.0)
MCH: 29.8 pg (ref 26.0–34.0)
MCHC: 35.9 g/dL (ref 30.0–36.0)
MCV: 83 fL (ref 78.0–100.0)
Platelets: 262 10*3/uL (ref 150–400)
RBC: 3.76 MIL/uL — ABNORMAL LOW (ref 3.87–5.11)
RDW: 11.7 % (ref 11.5–15.5)
WBC: 4.9 10*3/uL (ref 4.0–10.5)

## 2011-11-24 LAB — BASIC METABOLIC PANEL
BUN: <3 mg/dL — ABNORMAL LOW (ref 6–23)
CO2: 27 mEq/L (ref 19–32)
Calcium: 8.9 mg/dL (ref 8.4–10.5)
Chloride: 99 mEq/L (ref 96–112)
Creatinine, Ser: 0.4 mg/dL — ABNORMAL LOW (ref 0.50–1.10)
GFR calc Af Amer: >90 mL/min (ref >90)
GFR calc non Af Amer: >90 mL/min (ref >90)
Glucose, Bld: 125 mg/dL — ABNORMAL HIGH (ref 70–99)
Potassium: 3.6 mEq/L (ref 3.5–5.1)
Sodium: 134 mEq/L — ABNORMAL LOW (ref 135–145)

## 2011-11-24 LAB — GLUCOSE, CAPILLARY
Glucose-Capillary: 101 mg/dL — ABNORMAL HIGH (ref 70–99)
Glucose-Capillary: 118 mg/dL — ABNORMAL HIGH (ref 70–99)
Glucose-Capillary: 121 mg/dL — ABNORMAL HIGH (ref 70–99)
Glucose-Capillary: 141 mg/dL — ABNORMAL HIGH (ref 70–99)
Glucose-Capillary: 91 mg/dL (ref 70–99)
Glucose-Capillary: 93 mg/dL (ref 70–99)

## 2011-11-24 LAB — MAGNESIUM: Magnesium: 1.5 mg/dL (ref 1.5–2.5)

## 2011-11-24 LAB — LIPASE, BLOOD: Lipase: 27 U/L (ref 11–59)

## 2011-11-24 MED ORDER — METOPROLOL TARTRATE 1 MG/ML IV SOLN
5.0000 mg | Freq: Four times a day (QID) | INTRAVENOUS | Status: DC | PRN
  Filled 2011-11-24: qty 5

## 2011-11-24 MED ORDER — BISACODYL 10 MG RE SUPP: 10.0000 mg | Freq: Two times a day (BID) | RECTAL | Status: DC | PRN

## 2011-11-24 MED ORDER — HYDROMORPHONE HCL PF 1 MG/ML IJ SOLN
0.5000 mg | INTRAMUSCULAR | Status: DC | PRN
  Administered 2011-11-24 (×3): 1 mg via INTRAVENOUS
  Administered 2011-11-25 – 2011-11-26 (×11): 2 mg via INTRAVENOUS
  Filled 2011-11-24 (×2): qty 2
  Filled 2011-11-24: qty 1
  Filled 2011-11-24 (×2): qty 2
  Filled 2011-11-24: qty 1
  Filled 2011-11-24 (×2): qty 2
  Filled 2011-11-24: qty 1
  Filled 2011-11-24 (×2): qty 2
  Filled 2011-11-24 (×2): qty 1
  Filled 2011-11-24: qty 2

## 2011-11-24 MED ORDER — PROMETHAZINE HCL 25 MG/ML IJ SOLN
12.5000 mg | Freq: Four times a day (QID) | INTRAMUSCULAR | Status: DC | PRN
  Administered 2011-11-24: 12.5 mg via INTRAVENOUS
  Administered 2011-11-24: 25 mg via INTRAVENOUS
  Administered 2011-11-25: 12.5 mg via INTRAVENOUS
  Administered 2011-11-25: 25 mg via INTRAVENOUS
  Administered 2011-11-26: 12.5 mg via INTRAVENOUS
  Administered 2011-11-27 (×3): 25 mg via INTRAVENOUS
  Administered 2011-11-28: 12.5 mg via INTRAVENOUS
  Administered 2011-11-28 – 2011-12-05 (×22): 25 mg via INTRAVENOUS
  Filled 2011-11-24 (×32): qty 1

## 2011-11-24 MED ORDER — MAGIC MOUTHWASH: 15.0000 mL | Freq: Four times a day (QID) | ORAL | Status: DC | PRN

## 2011-11-24 MED ORDER — HYDROMORPHONE HCL PF 1 MG/ML IJ SOLN
1.0000 mg | INTRAMUSCULAR | Status: DC | PRN
  Administered 2011-11-24 (×2): 1 mg via INTRAVENOUS
  Filled 2011-11-24 (×2): qty 1

## 2011-11-24 MED ORDER — HYDRALAZINE HCL 20 MG/ML IJ SOLN
10.0000 mg | Freq: Once | INTRAMUSCULAR | Status: AC
  Administered 2011-11-24: 10 mg via INTRAVENOUS
  Filled 2011-11-24 (×2): qty 1

## 2011-11-24 MED ORDER — MAGNESIUM SULFATE 40 MG/ML IJ SOLN
2.0000 g | Freq: Once | INTRAMUSCULAR | Status: AC
  Administered 2011-11-24: 2 g via INTRAVENOUS
  Filled 2011-11-24: qty 50

## 2011-11-24 MED ORDER — PANTOPRAZOLE SODIUM 40 MG IV SOLR
40.0000 mg | Freq: Two times a day (BID) | INTRAVENOUS | Status: DC
  Administered 2011-11-25 – 2011-11-28 (×7): 40 mg via INTRAVENOUS
  Filled 2011-11-24 (×8): qty 40

## 2011-11-24 MED ORDER — ACETAMINOPHEN 650 MG RE SUPP: 650.0000 mg | Freq: Four times a day (QID) | RECTAL | Status: DC | PRN

## 2011-11-24 MED ORDER — HYDRALAZINE HCL 20 MG/ML IJ SOLN: 10.0000 mg | Freq: Four times a day (QID) | INTRAMUSCULAR | Status: DC | PRN

## 2011-11-24 MED ORDER — LIP MEDEX EX OINT
1.0000 "application " | TOPICAL_OINTMENT | Freq: Two times a day (BID) | CUTANEOUS | Status: DC
  Administered 2011-11-24 – 2011-12-06 (×23): 1 via TOPICAL
  Filled 2011-11-24 (×3): qty 7

## 2011-11-24 MED ORDER — PHENOL 1.4 % MT LIQD: 2.0000 | OROMUCOSAL | Status: DC | PRN

## 2011-11-24 MED ORDER — LIP MEDEX EX OINT: 1.0000 "application " | TOPICAL_OINTMENT | Freq: Two times a day (BID) | CUTANEOUS | Status: DC

## 2011-11-24 MED ORDER — MAGIC MOUTHWASH
15.0000 mL | Freq: Four times a day (QID) | ORAL | Status: DC | PRN
  Filled 2011-11-24: qty 15

## 2011-11-24 NOTE — Progress Notes (Signed)
Patient ID: [name], female   DOB: 1990-07-25, 21 y.o.   MRN: [id number] Patient ID: [name], female   DOB: 10-05-1990, 21 y.o.   MRN: [id number] 1 Day Post-Op  Subjective: Some abdominal pain, not severe. Nausea. No emesis. Objective: Vital signs in last 24 hours: Temp:  [97.4 F (36.3 C)-98.3 F (36.8 C)] 98 F (36.7 C) (08/26 0645) Pulse Rate:  [103-123] 114  (08/26 0645) Resp:  [16-18] 16  (08/26 0645) BP: (139-155)/(98-104) 147/102 mmHg (08/26 0645) SpO2:  [98 %-99 %] 98 % (08/26 0645) Last BM Date:  (pta)  Intake/Output from previous day: 08/25 0701 - 08/26 0700 In: 3555.2 [I.V.:3399.2; IV Piggyback:156] Out: 2960 [Urine:2100; Emesis/NG output:810; Drains:50] Intake/Output this shift:    General appearance: alert and no distress GI: abnormal findings:  mild tenderness in the entire abdomen and non distended Incision/Wound: Dry.  JP drainage serous Still with c/o nausea, no flatus. VSS,afebrile WBC wnl.  Lab Results:   Basename 11/24/11 0445 11/22/11 1950  WBC 4.9 4.0  HGB 11.2* 13.9  HCT 31.2* 37.6  PLT 262 275   BMET  Basename 11/24/11 0445 11/22/11 1950  NA 134* 135  K 3.6 3.6  CL 99 94*  CO2 27 28  GLUCOSE 125* 284*  BUN <3* 11  CREATININE 0.40* 0.41*  CALCIUM 8.9 9.7   . CBG (last 3)   Basename 11/24/11 1141 11/24/11 0742 11/24/11 0538  GLUCAP 93 121* 118*     Studies/Results: Ct Abdomen Pelvis W Contrast  11/22/2011  *RADIOLOGY REPORT*  Clinical Data: Mid abdominal pain  CT ABDOMEN AND PELVIS WITH CONTRAST  Technique:  Multidetector CT imaging of the abdomen and pelvis was performed following the standard protocol during bolus administration of intravenous contrast.  Contrast: OMNIPAQUE IOHEXOL 300 MG/ML  SOLN  Comparison: 11/22/2011 radiograph, 11/11/2011 CT  Findings: Limited images through the lung bases demonstrate no significant appreciable abnormality. The heart size is within normal limits. No pleural or pericardial  effusion.  Significant amount of pneumoperitoneum.  Interval cholecystectomy. Small amount of fluid and air at the surgical bed.  No loculated fluid collection.  Mild intrahepatic biliary ductal prominence to the level of the ampulla.  Mild main pancreatic duct dilatation is similar to prior.  Unremarkable spleen, adrenal glands, and kidneys.  No hydronephrosis or hydroureter.  Contrast within the colon from the previous examination. No bowel obstruction.  Interloop gas along the left hemiabdomen.  Small amount of free fluid within the pelvis dependently measures slightly higher than simple fluid.  Distended, thin-walled bladder.  Unremarkable CT appearance to the uterus and adnexa.  Normal caliber aorta and branch vessels.  Postoperative changes periumbilical.  Subcutaneous fat stranding may be reactive or infectious. No associated fluid collection.  No acute osseous finding.  IMPRESSION: Extensive pneumoperitoneum.  The source of which is uncertain however bowel injury not excluded. The quantity is more than typically seen 1 week postoperative.  There is a small air and fluid collection in the surgical bed which is not necessarily unexpected in the recently postoperative state.  Small amount of free fluid within the pelvis dependently measures slightly higher than simple fluid.   Original Report Authenticated By: Waneta Martins, M.D.    Dg Abd Acute W/chest  11/22/2011  *RADIOLOGY REPORT*  Clinical Data: 21 year old female with epigastric pain. Gallbladder surgery 7 days ago.  ACUTE ABDOMEN SERIES (ABDOMEN 2 VIEW & CHEST 1 VIEW)  Comparison: Intraoperative cholangiogram 11/15/2011 and earlier.  Findings: Good lung volumes, but  moderate volume of pneumoperitoneum subjacent to the diaphragm.  No pneumothorax or evidence of pneumomediastinum.  Other mediastinal contours are within normal limits.  Visualized tracheal air column is within normal limits.  The lungs are clear.  Oral contrast mixed with stool in  the left colon. Nonobstructed bowel gas pattern.  Right upper quadrant surgical clips with evidence of gas in the gallbladder fossa or under the liver. No acute osseous abnormality identified.  IMPRESSION: 1.  Moderate volume of pneumoperitoneum, seems beyond that expected for 7 days postop from cholecystectomy. 2.  Nonobstructed bowel gas pattern, contrast mixed with stool in the left colon. 3. No acute cardiopulmonary abnormality.   Original Report Authenticated By: Harley Hallmark, M.D.     Anti-infectives: Anti-infectives     Start     Dose/Rate Route Frequency Ordered Stop   11/23/11 0800   piperacillin-tazobactam (ZOSYN) IVPB 3.375 g  Status:  Discontinued        3.375 g 12.5 mL/hr over 240 Minutes Intravenous Every 8 hours 11/23/11 0341 11/24/11 1213   11/22/11 2230   piperacillin-tazobactam (ZOSYN) IVPB 3.375 g        3.375 g 100 mL/hr over 30 Minutes Intravenous  Once 11/22/11 2205 11/22/11 2326          Assessment/Plan: s/p Procedure(s): LAPAROSCOPY DIAGNOSTIC Free air, abdominal pain post lap chole No source identified, normal laparoscopy Continue NG Will add phenergan for nausea Dc Zosyn OOB    LOS: 2 days    Felecia Stanfill 11/24/2011

## 2011-11-24 NOTE — Progress Notes (Signed)
Tired Less abd pain Mom in room No flatus/BM but abd soft & no peritonitis Drain serous   -cont NGT until output down & more clear -mobilize more

## 2011-11-24 NOTE — Progress Notes (Signed)
INITIAL ADULT NUTRITION ASSESSMENT Date: 11/24/2011   Time: 4:13 PM Reason for Assessment: Nutrition Risk   ASSESSMENT: Female 21 y.o.  Dx: Abdominal pain  Hx:  Past Medical History  Diagnosis Date  . Diabetes mellitus   . Hypertension   . Diabetes type 1, uncontrolled 11/14/2011    Since age 25    Related Meds:  Scheduled Meds:   . heparin  5,000 Units Subcutaneous Q8H  . hydrALAZINE  10 mg Intravenous Once  . HYDROmorphone      . insulin aspart  0-15 Units Subcutaneous Q4H  . insulin glargine  15 Units Subcutaneous QHS  . lip balm  1 application Topical BID  . magnesium sulfate 1 - 4 g bolus IVPB  2 g Intravenous Once  . ondansetron (ZOFRAN) IV  4 mg Intravenous Once  . pantoprazole (PROTONIX) IV  40 mg Intravenous QHS  . DISCONTD: piperacillin-tazobactam (ZOSYN)  IV  3.375 g Intravenous Q8H   Continuous Infusions:   . 0.9 % NaCl with KCl 20 mEq / L 100 mL/hr at 11/24/11 1605   PRN Meds:.HYDROmorphone (DILAUDID) injection, magic mouthwash, menthol-cetylpyridinium, ondansetron (ZOFRAN) IV, ondansetron, phenol, promethazine, DISCONTD:  HYDROmorphone (DILAUDID) injection   Ht: 5\' 7"  (170.2 cm)  Wt: 121 lb 6.4 oz (55.067 kg)  Ideal Wt: 61.36 kg % Ideal Wt: 89.6% Wt Readings from Last 10 Encounters:  11/24/11 121 lb 6.4 oz (55.067 kg)  11/24/11 121 lb 6.4 oz (55.067 kg)  11/14/11 115 lb (52.164 kg)  11/14/11 115 lb (52.164 kg)  11/02/11 125 lb (56.7 kg)  10/28/11 123 lb (55.792 kg)  10/15/11 130 lb (58.968 kg)  09/18/11 130 lb 4.7 oz (59.1 kg)  01/01/10 158 lb 4 oz (71.782 kg)  12/29/08 173 lb 4 oz (78.586 kg) (93.00%*)   * Growth percentiles are based on CDC 2-20 Years data.    Usual Wt: 135 lb.  % Usual Wt: 89.6% *Weight down 14 lb from UBW.   Body mass index is 19.01 kg/(m^2). (WNL)  Food/Nutrition Related Hx: Patient reported appetite and intake were poor PTA. Weight is up 6 lb since last admission, but still below UBW.   Labs:  CMP     Component  Value Date/Time   NA 134* 11/24/2011 0445   K 3.6 11/24/2011 0445   CL 99 11/24/2011 0445   CO2 27 11/24/2011 0445   GLUCOSE 125* 11/24/2011 0445   BUN <3* 11/24/2011 0445   CREATININE 0.40* 11/24/2011 0445   CALCIUM 8.9 11/24/2011 0445   PROT 6.3 11/24/2011 0445   ALBUMIN 3.2* 11/24/2011 0445   AST 12 11/24/2011 0445   ALT 11 11/24/2011 0445   ALKPHOS 46 11/24/2011 0445   BILITOT 0.4 11/24/2011 0445   GFRNONAA >90 11/24/2011 0445   GFRAA >90 11/24/2011 0445    Intake/Output Summary (Last 24 hours) at 11/24/11 1618 Last data filed at 11/24/11 1413  Gross per 24 hour  Intake 2260.17 ml  Output   2470 ml  Net -209.83 ml     Diet Order: NPO  Supplements/Tube Feeding: none at this time  IVF:    0.9 % NaCl with KCl 20 mEq / L Last Rate: 100 mL/hr at 11/24/11 1605    Estimated Nutritional Needs:   Kcal: 1850-2050 Protein: 70-90 grams  Fluid: 1 ml per kcal intake   NUTRITION DIAGNOSIS: -Inadequate oral intake (NI-2.1).  Status: Ongoing  RELATED TO: inability to eat  AS EVIDENCE BY: NPO status  MONITORING/EVALUATION(Goals): Diet advancements/ tolerance, weights, labs 1. Diet  advancement as medically able to meet > 90% of estimated energy needs.  2. Prevent further weight loss.   EDUCATION NEEDS: -No education needs identified at this time  INTERVENTION: 1. Recommend diet advancement as medically able. Once diet adv, order resource breeze TID.  2. RD to follow for nutrition plan of care.   Dietitian [phone]  DOCUMENTATION CODES Per approved criteria  -Severe malnutrition in the context of chronic illness  * Patient meets malnutrition criteria due to 14 lb weight loss from baseline and PO intake reported meets < 75% of estimated energy requirements over 1 month.   Iven Finn [facility] 11/24/2011, 4:13 PM

## 2011-11-25 LAB — GLUCOSE, CAPILLARY
Glucose-Capillary: 129 mg/dL — ABNORMAL HIGH (ref 70–99)
Glucose-Capillary: 165 mg/dL — ABNORMAL HIGH (ref 70–99)
Glucose-Capillary: 69 mg/dL — ABNORMAL LOW (ref 70–99)
Glucose-Capillary: 72 mg/dL (ref 70–99)
Glucose-Capillary: 75 mg/dL (ref 70–99)

## 2011-11-25 LAB — MAGNESIUM: Magnesium: 1.9 mg/dL (ref 1.5–2.5)

## 2011-11-25 MED ORDER — DEXTROSE 50 % IV SOLN
25.0000 mL | Freq: Once | INTRAVENOUS | Status: AC
  Administered 2011-11-25: 25 mL via INTRAVENOUS

## 2011-11-25 MED ORDER — INSULIN ASPART 100 UNIT/ML ~~LOC~~ SOLN
0.0000 [IU] | SUBCUTANEOUS | Status: DC
  Administered 2011-11-25 – 2011-11-26 (×6): 1 [IU] via SUBCUTANEOUS
  Administered 2011-11-27 (×2): 2 [IU] via SUBCUTANEOUS
  Administered 2011-11-27 (×2): 1 [IU] via SUBCUTANEOUS
  Administered 2011-11-27: 2 [IU] via SUBCUTANEOUS
  Administered 2011-11-28: 1 [IU] via SUBCUTANEOUS
  Administered 2011-11-28: 7 [IU] via SUBCUTANEOUS
  Administered 2011-11-28: 2 [IU] via SUBCUTANEOUS
  Administered 2011-11-29: 3 [IU] via SUBCUTANEOUS
  Administered 2011-11-29 – 2011-11-30 (×4): 2 [IU] via SUBCUTANEOUS
  Administered 2011-11-30: 1 [IU] via SUBCUTANEOUS
  Administered 2011-11-30: 2 [IU] via SUBCUTANEOUS
  Administered 2011-11-30: 1 [IU] via SUBCUTANEOUS
  Administered 2011-11-30: 3 [IU] via SUBCUTANEOUS
  Administered 2011-12-01 (×4): 2 [IU] via SUBCUTANEOUS
  Administered 2011-12-02 (×2): 1 [IU] via SUBCUTANEOUS
  Administered 2011-12-02: 2 [IU] via SUBCUTANEOUS
  Administered 2011-12-02: 3 [IU] via SUBCUTANEOUS
  Administered 2011-12-02: 2 [IU] via SUBCUTANEOUS
  Administered 2011-12-03: 1 [IU] via SUBCUTANEOUS
  Administered 2011-12-03: 2 [IU] via SUBCUTANEOUS
  Administered 2011-12-03: 3 [IU] via SUBCUTANEOUS
  Administered 2011-12-05: 7 [IU] via SUBCUTANEOUS
  Administered 2011-12-05: 1 [IU] via SUBCUTANEOUS
  Administered 2011-12-05: 2 [IU] via SUBCUTANEOUS
  Administered 2011-12-05: 1 [IU] via SUBCUTANEOUS
  Administered 2011-12-06: 2 [IU] via SUBCUTANEOUS
  Administered 2011-12-06: 3 [IU] via SUBCUTANEOUS
  Administered 2011-12-06: 2 [IU] via SUBCUTANEOUS
  Administered 2011-12-06: 1 [IU] via SUBCUTANEOUS

## 2011-11-25 MED ORDER — DEXTROSE 50 % IV SOLN
INTRAVENOUS | Status: AC
  Administered 2011-11-25: 25 mL
  Filled 2011-11-25: qty 50

## 2011-11-25 MED ORDER — INSULIN GLARGINE 100 UNIT/ML ~~LOC~~ SOLN
8.0000 [IU] | Freq: Every day | SUBCUTANEOUS | Status: DC
  Administered 2011-11-25 – 2011-11-28 (×4): 8 [IU] via SUBCUTANEOUS

## 2011-11-25 MED ORDER — KCL IN DEXTROSE-NACL 40-5-0.45 MEQ/L-%-% IV SOLN
INTRAVENOUS | Status: DC
  Administered 2011-11-25 – 2011-11-27 (×6): via INTRAVENOUS
  Administered 2011-11-28: 50 mL/h via INTRAVENOUS
  Administered 2011-11-29: 08:00:00 via INTRAVENOUS
  Filled 2011-11-25 (×10): qty 1000

## 2011-11-25 NOTE — Progress Notes (Addendum)
NGT out put low - try clamping NEEDS TO WALK!!! Stressed this to the pt & RN.  Control pain to allow her to do that Probable depression/anxiety component - follow w lorazepam PRN D/w RN - glc low - will cut lantus in half QHS & make SSI more sensitive/lighter for now & follow

## 2011-11-25 NOTE — Progress Notes (Signed)
Cbg at around 12noon was 36 - pt stated she felt weak & dizzy. Administered 25ml D50. Rechecked in approx. - f/u cbg 126. Pt stated that she felt better.

## 2011-11-25 NOTE — Progress Notes (Signed)
CBG 64. Pt has no signs & symptoms at this time. Administered 25ml D50. Will recheck cbg in 15 minutes.

## 2011-11-25 NOTE — Progress Notes (Signed)
Ambulated with pt down hall almost to nursing station.  Pt held on to side rail and c/o feeling dizzy & nauseous during ambulation.  Had to have care coordinator bring chair from room so patient could sit down.  Took pt back to room in chair.  Pt stated that she gets dizzy & nauseous every time she gets up oob.  Will try ambulating again later.

## 2011-11-25 NOTE — Progress Notes (Signed)
Clamped patient's NG tube around 11:30am.

## 2011-11-25 NOTE — Progress Notes (Signed)
Patient ID: [name], female   DOB: [birth year], 21 y.o.   MRN: [id number] Patient ID: [name], female   DOB: December 21, 1990, 21 y.o.   MRN: [id number] Patient ID: [name], female   DOB: 02-10-1991, 21 y.o.   MRN: [id number] 2 Days Post-Op  Subjective: Still c/o of diffuse abdominal pain, not severe. Occasionally " sharp" in nature. nausea. No emesis.  Objective: Vital signs in last 24 hours: Temp:  [97.9 F (36.6 C)-99.6 F (37.6 C)] 97.9 F (36.6 C) (08/27 0553) Pulse Rate:  [118-126] 118  (08/27 0553) Resp:  [16-18] 18  (08/27 0553) BP: (129-162)/(91-119) 129/94 mmHg (08/27 0553) SpO2:  [98 %-100 %] 98 % (08/27 0553) Weight:  [121 lb 6.4 oz (55.067 kg)] 121 lb 6.4 oz (55.067 kg) (08/26 1612) Last BM Date:  (pta)  Intake/Output from previous day: 08/26 0701 - 08/27 0700 In: 3059.6 [I.V.:2614.6; NG/GT:435] Out: 2030 [Urine:1800; Emesis/NG output:225; Drains:5] Intake/Output this shift: Total I/O In: -  Out: 400 [Urine:400]  General appearance: alert and no distress GI: abnormal findings:  mild tenderness in the entire abdomen and non distended Incision/Wound: Dry.  JP drainage serous( 5ml output/24 hrs) NG output (225/24hrs) Still with c/o nausea, no flatus. VSS,afebrile WBC wnl.  Lab Results:   Basename 11/24/11 0445 11/22/11 1950  WBC 4.9 4.0  HGB 11.2* 13.9  HCT 31.2* 37.6  PLT 262 275   BMET  Basename 11/24/11 0445 11/22/11 1950  NA 134* 135  K 3.6 3.6  CL 99 94*  CO2 27 28  GLUCOSE 125* 284*  BUN <3* 11  CREATININE 0.40* 0.41*  CALCIUM 8.9 9.7   . CBG (last 3)   Basename 11/25/11 0740 11/25/11 0144 11/25/11 0021  GLUCAP 75 69* 72     Studies/Results: No results found.  Anti-infectives: Anti-infectives     Start     Dose/Rate Route Frequency Ordered Stop   11/23/11 0800   piperacillin-tazobactam (ZOSYN) IVPB 3.375 g  Status:  Discontinued        3.375 g 12.5 mL/hr over 240 Minutes Intravenous Every 8 hours 11/23/11 0341 11/24/11  1213   11/22/11 2230  piperacillin-tazobactam (ZOSYN) IVPB 3.375 g       3.375 g 100 mL/hr over 30 Minutes Intravenous  Once 11/22/11 2205 11/22/11 2326          Assessment/Plan: s/p Procedure(s): LAPAROSCOPY DIAGNOSTIC Free air, abdominal pain post lap chole No source identified, normal laparoscopy  1. Clamp NG 2. OOB, ambulate 3. Recheck mag level (was repleted yesterday) 4. ? D/C JP will discuss with Dr. [name].    LOS: 3 days    Carlyann Placide 11/25/2011

## 2011-11-25 NOTE — Progress Notes (Signed)
Pt ambulated in hall halfway past the nursing station and back to room. Pt complained of feeling "a little bit nauseous" and it "hurt just a little bit". Will continue to encourage ambulation.

## 2011-11-25 NOTE — Progress Notes (Signed)
Checked residual of clamped NG tube - 50ml residual

## 2011-11-26 DIAGNOSIS — E1065 Type 1 diabetes mellitus with hyperglycemia: Secondary | ICD-10-CM

## 2011-11-26 DIAGNOSIS — E1169 Type 2 diabetes mellitus with other specified complication: Secondary | ICD-10-CM

## 2011-11-26 DIAGNOSIS — I10 Essential (primary) hypertension: Secondary | ICD-10-CM

## 2011-11-26 DIAGNOSIS — K668 Other specified disorders of peritoneum: Secondary | ICD-10-CM

## 2011-11-26 LAB — GLUCOSE, CAPILLARY
Glucose-Capillary: 111 mg/dL — ABNORMAL HIGH (ref 70–99)
Glucose-Capillary: 119 mg/dL — ABNORMAL HIGH (ref 70–99)
Glucose-Capillary: 121 mg/dL — ABNORMAL HIGH (ref 70–99)
Glucose-Capillary: 122 mg/dL — ABNORMAL HIGH (ref 70–99)
Glucose-Capillary: 127 mg/dL — ABNORMAL HIGH (ref 70–99)
Glucose-Capillary: 128 mg/dL — ABNORMAL HIGH (ref 70–99)
Glucose-Capillary: 139 mg/dL — ABNORMAL HIGH (ref 70–99)
Glucose-Capillary: 142 mg/dL — ABNORMAL HIGH (ref 70–99)
Glucose-Capillary: 64 mg/dL — ABNORMAL LOW (ref 70–99)
Glucose-Capillary: 67 mg/dL — ABNORMAL LOW (ref 70–99)

## 2011-11-26 LAB — TSH: TSH: 2.045 u[IU]/mL (ref 0.350–4.500)

## 2011-11-26 LAB — HEMOGLOBIN A1C
Hgb A1c MFr Bld: 6.1 % — ABNORMAL HIGH (ref <5.7)
Mean Plasma Glucose: 128 mg/dL — ABNORMAL HIGH (ref <117)

## 2011-11-26 MED ORDER — HYDROMORPHONE HCL PF 1 MG/ML IJ SOLN
0.5000 mg | INTRAMUSCULAR | Status: DC | PRN
  Administered 2011-11-26: 1 mg via INTRAVENOUS
  Filled 2011-11-26: qty 1

## 2011-11-26 MED ORDER — HYDRALAZINE HCL 20 MG/ML IJ SOLN
10.0000 mg | Freq: Four times a day (QID) | INTRAMUSCULAR | Status: DC | PRN
  Administered 2011-12-04: 10 mg via INTRAVENOUS
  Filled 2011-11-26: qty 1

## 2011-11-26 MED ORDER — ACETAMINOPHEN 10 MG/ML IV SOLN
1000.0000 mg | Freq: Four times a day (QID) | INTRAVENOUS | Status: AC
  Administered 2011-11-26 – 2011-11-27 (×4): 1000 mg via INTRAVENOUS
  Filled 2011-11-26 (×4): qty 100

## 2011-11-26 MED ORDER — HYDROMORPHONE HCL PF 1 MG/ML IJ SOLN
1.0000 mg | INTRAMUSCULAR | Status: DC | PRN
  Administered 2011-11-26 – 2011-12-01 (×25): 1 mg via INTRAVENOUS
  Filled 2011-11-26 (×25): qty 1

## 2011-11-26 MED ORDER — ENALAPRIL MALEATE 2.5 MG PO TABS
2.5000 mg | ORAL_TABLET | Freq: Every day | ORAL | Status: DC
  Administered 2011-11-26: 21:00:00 via ORAL
  Filled 2011-11-26 (×2): qty 1

## 2011-11-26 MED ORDER — ENALAPRIL MALEATE 10 MG PO TABS
10.0000 mg | ORAL_TABLET | Freq: Every day | ORAL | Status: DC
  Filled 2011-11-26: qty 1

## 2011-11-26 MED ORDER — HYDRALAZINE HCL 20 MG/ML IJ SOLN
5.0000 mg | Freq: Three times a day (TID) | INTRAMUSCULAR | Status: DC
  Filled 2011-11-26 (×3): qty 0.25

## 2011-11-26 MED ORDER — METOPROLOL TARTRATE 1 MG/ML IV SOLN
5.0000 mg | Freq: Four times a day (QID) | INTRAVENOUS | Status: DC
  Administered 2011-11-26 – 2011-11-27 (×4): 5 mg via INTRAVENOUS
  Filled 2011-11-26 (×7): qty 5

## 2011-11-26 NOTE — Progress Notes (Signed)
3 Days Post-Op  Subjective: Lying in bed, just got dilaudid for pain.  Some flatus, no nausea, NG clamped most of day yesterday.Says her sugar dropped yesterday., so she couldn't get up.  Objective: Vital signs in last 24 hours: Temp:  [97.7 F (36.5 C)-98.5 F (36.9 C)] 97.8 F (36.6 C) (08/28 0547) Pulse Rate:  [110-128] 128  (08/28 0547) Resp:  [18-20] 19  (08/28 0547) BP: (121-149)/(86-99) 121/86 mmHg (08/28 0547) SpO2:  [99 %-100 %] 100 % (08/28 0547) Last BM Date: 11/14/11 (states not eating-since last surgery on 8-16)  Nothing PO recorded, Diet: NPO, afebrile, Tachycardic with poor Blood pressure control, no labs 150 from NG yesterday, 1 ML from drain. Intake/Output from previous day: 08/27 0701 - 08/28 0700 In: 1654.2 [I.V.:1504.2; NG/GT:150] Out: 551 [Urine:400; Emesis/NG output:150; Drains:1] Intake/Output this shift:    General appearance: alert, cooperative and no distress Resp: clear to auscultation bilaterally GI: soft, just had 2 mg dilaudid, so she's sleepy, few BS, some flatus, incsisions look good.    Lab Results:   [facility] 11/24/11 0445  WBC 4.9  HGB 11.2*  HCT 31.2*  PLT 262    BMET  Basename 11/24/11 0445  NA 134*  K 3.6  CL 99  CO2 27  GLUCOSE 125*  BUN <3*  CREATININE 0.40*  CALCIUM 8.9   PT/INR No results found for this basename: LABPROT:2,INR:2 in the last 72 hours   Lab 11/24/11 0445 11/22/11 1950  AST 12 14  ALT 11 14  ALKPHOS 46 57  BILITOT 0.4 0.5  PROT 6.3 7.7  ALBUMIN 3.2* 4.1     Lipase     Component Value Date/Time   LIPASE 27 11/24/2011 0445     Studies/Results: No results found.  Medications:    . dextrose  25 mL Intravenous Once  . dextrose      . heparin  5,000 Units Subcutaneous Q8H  . insulin aspart  0-9 Units Subcutaneous Q4H  . insulin glargine  8 Units Subcutaneous QHS  . lip balm  1 application Topical BID  . pantoprazole (PROTONIX) IV  40 mg Intravenous Q12H  . DISCONTD: insulin aspart  0-15  Units Subcutaneous Q4H  . DISCONTD: insulin glargine  15 Units Subcutaneous QHS    Assessment/Plan Free air, abdominal pain post lap cholecystectomy 11/15/11 Dr.Gross; s/p diagnostic laparoscopy,11/23/11, with nothing abnormal found as source of air. Diabetes mellitus type 1 since age 82. HBA1C 12.9  Hypertension, currently with poor control  Hx of recurrent DKA Last hospitalization 09/17/11.    Post op ileus, Using allot of pain meds.  Plan:  Start her on some clears, if she does well get NG out.  Get her out of bed, get her off narcotics.  22 mg dilaudid since yesterday at midnight.  Ask medicine to help with diabetes and BP.  If she tolerates her po's advance and most important get her OOB.  She denies any depression, but isn't acting like it. Labs in AM.       LOS: 4 days    [name] 11/26/2011

## 2011-11-26 NOTE — Consult Note (Signed)
[facility] Medical Consultation  [name] JJK:[id number] DOB: [birth year] DOA: 11/22/2011 PCP: [name], NP   Requesting physician: CCS; Dr. Johney Maine Date of consultation: 11/26/11 Reason for consultation: HTN and DM  Impression/Recommendations 1. IDDM: will check A1C; last one 6.6; but previously was 12.9; big gap in between 2 values, especially in someone with many DKA admissions and with values overlapping 12- months difference.  While NPO will continue low dose lantus and also SSI. Dose to be adjusted once PO resume depending CBG's. Patient is type 1 diabetic, which means oral hypoglycemics are not indicated. Will ask diabetes coordinator to discuss and reeducate patient about condition and treatment. CBG's now in the 130-140's. No signs of DKA  2. HTN: worse 2/2 pain. Continue pain control and while NPO will start schedule IV hydralazine. Once taking PO's again will start vasotec and adjust dose as needed.  3. Pneumoperitoneum and abd pain: per primary team; CCS.  4. DVT: Heparin  5. GERD: IV protonix  I will followup again tomorrow. Please contact me if I can be of assistance in the meanwhile. Thank you for this consultation.  Chief Complaint: elevated BP, diabetes.   HPI:  21 year old insulin-dependent diabetic who one week ago underwent an apparently uneventful laparoscopic cholecystectomy with cholangiogram. She had presented to the hospital with recurrent and persistent abdominal pain; initial workup demonstrated pneumoperitoneum and patient was admitted by CCS for further evaluation and treatment. Patient currently S/P exploratory laparoscopic w/o any abnormalities to explain pneumoperitoneum. Surgery has consult [place] for assistance controlling her HTN and also IDDM, especially now that patient is NPO. Denies CP, SO, N/V,  Vision changes or fever. Patient reports still pain in her abdomen (epigastric area mainly).    Review of Systems:  Negative except as mentioned on  HPI.  Past Medical History  Diagnosis Date  . Diabetes mellitus   . Hypertension   . Diabetes type 1, uncontrolled 11/14/2011    Since age 70   Past Surgical History  Procedure Date  . Ankle surgery   . Cholecystectomy 11/15/2011    Procedure: LAPAROSCOPIC CHOLECYSTECTOMY WITH INTRAOPERATIVE CHOLANGIOGRAM;  Surgeon: [name], MD;  Location: [facility];  Service: General;  Laterality: N/A;  . Laparoscopy 11/23/2011    Procedure: LAPAROSCOPY DIAGNOSTIC;  Surgeon: Edward Jolly, MD;  Location: [facility];  Service: General;  Laterality: N/A;   Social History:  reports that she has never smoked. She has never used smokeless tobacco. She reports that she does not drink alcohol or use illicit drugs. Lives at home with her husband; able to perform ADL's w/o assistance  No Known Allergies   Family History: Mother with diabetes and HTN   Prior to Admission medications   Medication Sig Start Date End Date Taking? Authorizing Provider  aspirin 325 MG tablet Take 325 mg by mouth daily.   Yes Historical Provider, MD  enalapril (VASOTEC) 10 MG tablet Take 10 mg by mouth every evening.    Yes Historical Provider, MD  HYDROcodone-acetaminophen (NORCO/VICODIN) 5-325 MG per tablet Take 1 tablet by mouth every 6 (six) hours as needed. Pain   Yes Historical Provider, MD  insulin aspart (NOVOLOG) 100 UNIT/ML injection Inject 0-15 Units into the skin 3 (three) times daily before meals. 5 u with each meal + sliding scale: CBG 70 - 120: 0 u: CBG 121 - 150: 2 u; CBG 151 - 200: 3 u; CBG 201 - 250: 5 u; CBG 251 - 300: 8 u;CBG 301 - 350: 11 u; CBG 351 -  400: 15 u; CBG > 400 Call MD 09/20/11 09/19/12 Yes [name], MD  insulin glargine (LANTUS) 100 UNIT/ML injection Inject 30 Units into the skin at bedtime.  09/20/11  Yes Venetia Maxon Rama, MD  metFORMIN (GLUCOPHAGE) 500 MG tablet Take 500 mg by mouth 2 (two) times daily with a meal.   Yes Historical Provider, MD  metoCLOPramide (REGLAN) 10 MG tablet Take  10 mg by mouth 4 (four) times daily.   Yes Historical Provider, MD  Multiple Vitamin (MULTIVITAMIN WITH MINERALS) TABS Take 1 tablet by mouth daily.   Yes Historical Provider, MD  omeprazole (PRILOSEC) 20 MG capsule Take 20 mg by mouth daily.   Yes Historical Provider, MD   Physical Exam: Blood pressure 131/89, pulse 112, temperature 98.3 F (36.8 C), temperature source Oral, resp. rate 20, height 5' 7"  (1.702 m), weight 55.067 kg (121 lb 6.4 oz), last menstrual period 10/16/2011, SpO2 100.00%. Filed Vitals:   11/26/11 0545 11/26/11 0546 11/26/11 0547 11/26/11 1406  BP: 126/86 121/89 121/86 131/89  Pulse: 114 116 128 112  Temp: 97.7 F (36.5 C) 97.9 F (36.6 C) 97.8 F (36.6 C) 98.3 F (36.8 C)  TempSrc: Oral Oral Oral Oral  Resp: 18 18 19 20   Height:      Weight:      SpO2: 100% 100% 100% 100%     General:  Thin AAF, with flat affect; but able to communicate and cooperate with exam  Eyes: anicteric, PERRLA, no nystagmus  ENT: moist mucose membranes, no erythema or exudate  Neck: supple, no thyromegaly  Cardiovascular: tachycardic, no rubs or gallops; no murmurs  Respiratory: CTA bilaterally  Abdomen: soft, no distended; drain and small anterior wound from laparoscopic procedure appreciated; wound clean and without drainage or erythema; decreased BS.  Skin: no rashes  Musculoskeletal: FROM, no sweelling or erythema on her joints  Psychiatric: flat affect; frustrated with all recent admissions and health status decline;denies depression, SI or hallucinations  Neurologic: CN intact; no focal sensory or motor deficit  Labs on Admission:  Basic Metabolic Panel:  Lab 59/93/57 1005 11/24/11 0445 11/22/11 1950  NA -- 134* 135  K -- 3.6 3.6  CL -- 99 94*  CO2 -- 27 28  GLUCOSE -- 125* 284*  BUN -- <3* 11  CREATININE -- 0.40* 0.41*  CALCIUM -- 8.9 9.7  MG 1.9 1.5 --  PHOS -- -- --   Liver Function Tests:  Lab 11/24/11 0445 11/22/11 1950  AST 12 14  ALT 11 14    ALKPHOS 46 57  BILITOT 0.4 0.5  PROT 6.3 7.7  ALBUMIN 3.2* 4.1    Lab 11/24/11 0445 11/22/11 1950  LIPASE 27 31  AMYLASE -- --   CBC:  Lab 11/24/11 0445 11/22/11 1950  WBC 4.9 4.0  NEUTROABS -- --  HGB 11.2* 13.9  HCT 31.2* 37.6  MCV 83.0 82.6  PLT 262 275   CBG:  Lab 11/26/11 1128 11/26/11 0739 11/26/11 0415 11/26/11 0011 11/25/11 2015  GLUCAP 111* 121* 128* 142* 122*    Radiological Exams on Admission: No results found.   Time spent: > 30 minutes  Johnatha Zeidman [facility] Pager [phone]  If 7PM-7AM, please contact night-coverage [email or web address] Password [facility] 11/26/2011, 2:38 PM

## 2011-11-26 NOTE — Progress Notes (Signed)
Residual of clamped NGT-145mL light brown residual JP drain output- 1mL serous fluid

## 2011-11-26 NOTE — Progress Notes (Signed)
Pt refused 2200 heparin injection. Pt educated on the reason for heparin and encouraged the pt to take it. Pt refused.

## 2011-11-26 NOTE — Progress Notes (Signed)
Residual of clamped NGT- 

## 2011-11-26 NOTE — Progress Notes (Signed)
Smiled a little bit but still sad/depressed/tired. Tachycardia - try b blocker & follow.  Decrease ACE@ inh dose.   Watch out for orthostasis. Encouraged her to mobilize more Appreciate IMed help, esp w brittle DM1

## 2011-11-27 LAB — GLUCOSE, CAPILLARY
Glucose-Capillary: 141 mg/dL — ABNORMAL HIGH (ref 70–99)
Glucose-Capillary: 114 mg/dL — ABNORMAL HIGH (ref 70–99)
Glucose-Capillary: 144 mg/dL — ABNORMAL HIGH (ref 70–99)
Glucose-Capillary: 179 mg/dL — ABNORMAL HIGH (ref 70–99)
Glucose-Capillary: 183 mg/dL — ABNORMAL HIGH (ref 70–99)
Glucose-Capillary: 183 mg/dL — ABNORMAL HIGH (ref 70–99)
Glucose-Capillary: 122 mg/dL — ABNORMAL HIGH (ref 70–99)

## 2011-11-27 LAB — COMPREHENSIVE METABOLIC PANEL
ALT: 11 U/L (ref 0–35)
AST: 12 U/L (ref 0–37)
Albumin: 3.5 g/dL (ref 3.5–5.2)
Alkaline Phosphatase: 59 U/L (ref 39–117)
BUN: 3 mg/dL — ABNORMAL LOW (ref 6–23)
CO2: 28 mEq/L (ref 19–32)
Calcium: 9.6 mg/dL (ref 8.4–10.5)
Chloride: 95 mEq/L — ABNORMAL LOW (ref 96–112)
Creatinine, Ser: 0.38 mg/dL — ABNORMAL LOW (ref 0.50–1.10)
GFR calc Af Amer: >90 mL/min (ref >90)
GFR calc non Af Amer: >90 mL/min (ref >90)
Glucose, Bld: 140 mg/dL — ABNORMAL HIGH (ref 70–99)
Potassium: 3.8 mEq/L (ref 3.5–5.1)
Sodium: 135 mEq/L (ref 135–145)
Total Bilirubin: 0.9 mg/dL (ref 0.3–1.2)
Total Protein: 7.2 g/dL (ref 6.0–8.3)

## 2011-11-27 LAB — CBC
HCT: 34.6 % — ABNORMAL LOW (ref 36.0–46.0)
Hemoglobin: 12.5 g/dL (ref 12.0–15.0)
MCH: 30.6 pg (ref 26.0–34.0)
MCHC: 36.1 g/dL — ABNORMAL HIGH (ref 30.0–36.0)
MCV: 84.8 fL (ref 78.0–100.0)
Platelets: 327 10*3/uL (ref 150–400)
RBC: 4.08 MIL/uL (ref 3.87–5.11)
RDW: 12 % (ref 11.5–15.5)
WBC: 3.4 10*3/uL — ABNORMAL LOW (ref 4.0–10.5)

## 2011-11-27 LAB — CORTISOL: Cortisol, Plasma: 11.3 ug/dL

## 2011-11-27 MED ORDER — METOCLOPRAMIDE HCL 5 MG PO TABS
5.0000 mg | ORAL_TABLET | Freq: Three times a day (TID) | ORAL | Status: DC
  Administered 2011-11-27 – 2011-11-28 (×5): 5 mg via ORAL
  Filled 2011-11-27 (×8): qty 1

## 2011-11-27 MED ORDER — ENALAPRIL MALEATE 5 MG PO TABS
5.0000 mg | ORAL_TABLET | Freq: Every day | ORAL | Status: DC
  Administered 2011-11-27: 5 mg via ORAL
  Filled 2011-11-27 (×2): qty 1

## 2011-11-27 MED ORDER — BISACODYL 10 MG RE SUPP
10.0000 mg | Freq: Once | RECTAL | Status: AC
  Administered 2011-11-27: 10 mg via RECTAL
  Filled 2011-11-27: qty 1

## 2011-11-27 MED ORDER — HYDROCODONE-ACETAMINOPHEN 5-325 MG PO TABS
1.0000 | ORAL_TABLET | ORAL | Status: DC | PRN
  Administered 2011-11-27: 1 via ORAL
  Filled 2011-11-27: qty 1

## 2011-11-27 NOTE — Progress Notes (Signed)
Referral while I was in unit to see another pt. Entered pt room at 1835 and asked family members to briefly step out of the room. I asked Ms Closser how I could assist her. She said she was sad but not depressed. She said her family was here and she was getting support from them. They were anxious to be with her. I offered chaplain support anytime she felt sad and wished to talk with someone. She requested prayer and we prayed.  Strongly suggest chaplain follow up when family not present, as Ms Winbush does not wish to alert them to her deepen feelings of sadness and stress. Stress counsel not given because of the anxiousness of family members. Request chaplain follow up in this area also.  PLEASE PAGE if Ms Riggin seems overly sad, depressed or despondent.  Benjie Karvonen. Egan Sahlin, D.Min, APC Chaplain

## 2011-11-27 NOTE — Progress Notes (Signed)
Try PO Mobilize more Prob benefit  With antidepressant - defer to IMed Bowel regimen as tolerated

## 2011-11-27 NOTE — Plan of Care (Signed)
Problem: Phase I Progression Outcomes Goal: OOB as tolerated unless otherwise ordered Outcome: Not Progressing Encouraged pt to ambulate in hall, pt still refusing. Pt ambulated to bathroom during this shift but not in hallway.

## 2011-11-27 NOTE — Progress Notes (Signed)
Inpatient Diabetes Program Recommendations  AACE/ADA: New Consensus Statement on Inpatient Glycemic Control (2013)  Target Ranges:  Prepandial:   less than 140 mg/dL      Peak postprandial:   less than 180 mg/dL (1-2 hours)      Critically ill patients:  140 - 180 mg/dL   Diabetes Coordinator spoke with patient this afternoon.  Patient was going through a stressful time when her A1C=12.9 but has been in the 6 range the last few weeks.  Patient is interested in getting an insulin pump.  She has never had one before.  I gave her the names and numbers for local endocrinologists.  She will seek out an appointment with one of them to hopefully obtain an insulin pump.  As far as the Metformin is concerned, it is not completely unheard of to have a type 1 patient on Metformin.  There is some evidence to show that is can actually lower the insulin dose the patient has to take.  The patient seemed in good spirit and happy to have the contact information for local endocrinologists.  She does not have any other questions or concerns about her DM at this time.    Thank you  Piedad Climes [facility] Inpatient Diabetes Coordinator [phone]

## 2011-11-27 NOTE — Progress Notes (Signed)
[facility] PROGRESS NOTE  [name] RCB:[id number] DOB: February 11, 1991 DOA: 11/22/2011 PCP: [name], NP  Assessment/Plan: 1. IDDM: A1C 6.1; Patient not eating much and mainly adjusting hypoglycemic agents to D5 given IV. Will decrease D5 rate, use low dose lantus and avoid excessive SSI coverage at this moment. Once eating better will continue adjusting her insulin. To avoid not feeling hypoglycemic events will d/c schedule B-blocker.  2. HTN: worse 2/2 pain. Continue pain control and will restart enalapril.  3-Pneumoperitoneum and abd pain: per primary team; CCS.   4. DVT: Heparin   5. GERD: IV protonix  6- Depression: will start low dose celexa daily.  7- Gastroparesis: will continue Reglan.  Code Status: Full Family Communication: husband at bedside Disposition Plan: home when stable   Brief narrative: 21 year old insulin-dependent diabetic who one week ago underwent an apparently uneventful laparoscopic cholecystectomy with cholangiogram. She had presented to the hospital with recurrent and persistent abdominal pain; initial workup demonstrated pneumoperitoneum and patient was admitted by CCS for further evaluation and treatment. Patient currently S/P exploratory laparoscopic w/o any abnormalities to explain pneumoperitoneum. Surgery has consult [place] for assistance controlling her HTN and also IDDM, especially now that patient is NPO. Denies CP, SO, N/V, Vision changes or fever. Patient reports still pain in her abdomen (epigastric area mainly).    Antibiotics:  none  HPI/Subjective: Afebrile; feeling nauseated and still with some pain on her belly; denies vomiting. Patient endorses BM after dulcolax given this morning.  Objective: Filed Vitals:   11/26/11 2117 11/27/11 0022 11/27/11 0119 11/27/11 0543  BP: 151/90 146/99 162/99 127/78  Pulse: 107 111  106  Temp:  98.2 F (36.8 C)  98.8 F (37.1 C)  TempSrc:  Oral  Oral  Resp: 18 20  19   Height:      Weight:       SpO2:    99%    Intake/Output Summary (Last 24 hours) at 11/27/11 1222 Last data filed at 11/27/11 1100  Gross per 24 hour  Intake 3920.26 ml  Output 3654.5 ml  Net 265.76 ml   Filed Weights   11/22/11 1912 11/23/11 0330 11/24/11 1612  Weight: 52.164 kg (115 lb) 60.782 kg (134 lb) 55.067 kg (121 lb 6.4 oz)    Exam:   General:  Flat affect, NAD; feeling nauseated. NGT removed  Cardiovascular: S1 and S2; no rubs or gallops  Respiratory: CTA  Abdomen: soft, no guarding; positive BS and no distension  Neuro: non focal  Data Reviewed: Basic Metabolic Panel:  Lab 80/32/12 0440 11/25/11 1005 11/24/11 0445 11/22/11 1950  NA 135 -- 134* 135  K 3.8 -- 3.6 3.6  CL 95* -- 99 94*  CO2 28 -- 27 28  GLUCOSE 140* -- 125* 284*  BUN 3* -- <3* 11  CREATININE 0.38* -- 0.40* 0.41*  CALCIUM 9.6 -- 8.9 9.7  MG -- 1.9 1.5 --  PHOS -- -- -- --   Liver Function Tests:  Lab 11/27/11 0440 11/24/11 0445 11/22/11 1950  AST 12 12 14   ALT 11 11 14   ALKPHOS 59 46 57  BILITOT 0.9 0.4 0.5  PROT 7.2 6.3 7.7  ALBUMIN 3.5 3.2* 4.1    Lab 11/24/11 0445 11/22/11 1950  LIPASE 27 31  AMYLASE -- --   CBC:  Lab 11/27/11 0440 11/24/11 0445 11/22/11 1950  WBC 3.4* 4.9 4.0  NEUTROABS -- -- --  HGB 12.5 11.2* 13.9  HCT 34.6* 31.2* 37.6  MCV 84.8 83.0 82.6  PLT 327 262  275   BNP (last 3 results)  Basename 11/14/11 1605  PROBNP 20.0   CBG:  Lab 11/27/11 1139 11/27/11 0732 11/27/11 0401 11/27/11 0015 11/26/11 2004  GLUCAP 183* 179* 114* 144* 141*      Studies: Dg Cholangiogram Operative  11/15/2011  *RADIOLOGY REPORT*  Clinical Data:   Evaluate for common bile duct stone. Cholecystectomy.  INTRAOPERATIVE CHOLANGIOGRAM  Technique:  Cholangiographic images from the C-arm fluoroscopic device were submitted for interpretation post-operatively.  Please see the procedural report for the amount of contrast and the fluoroscopy time utilized.  Comparison:  Nuclear medicine study  11/14/2011.  Findings:  Normal caliber of the common duct, without persistent filling defect to suggest choledocholithiasis.  No contrast extravasation.  Free spill of contrast into the descending duodenum.  IMPRESSION: No evidence of choledocholithiasis.  Original Report Authenticated By: Areta Haber, M.D.   Ct Angio Chest W/cm &/or Wo Cm  11/07/2011  *RADIOLOGY REPORT*  Clinical Data: Tachycardia.  CT ANGIOGRAPHY CHEST  Technique:  Multidetector CT imaging of the chest using the standard protocol during bolus administration of intravenous contrast. Multiplanar reconstructed images including MIPs were obtained and reviewed to evaluate the vascular anatomy.  Contrast: 156m OMNIPAQUE IOHEXOL 350 MG/ML SOLN  Comparison: None.  Findings: Technically adequate study with good opacification of the central and segmental pulmonary arteries.  No focal filling defects demonstrated.  No evidence of significant pulmonary embolus.  Normal heart size.  Normal caliber thoracic aorta.  No significant lymphadenopathy in the chest.  The esophagus is decompressed. Visualized portions of the upper abdominal organs are grossly unremarkable.  No pleural effusions.  No focal airspace consolidation or interstitial changes in the lungs.  No pneumothorax.  There is a tiny bleb or cyst in the right apex.  Airways appear patent. Normal alignment of the thoracic vertebrae.  IMPRESSION: No evidence of significant pulmonary embolus.  Original Report Authenticated By: WNeale Burly M.D.   Nm Hepatobiliary  11/14/2011  *RADIOLOGY REPORT*  Clinical Data:  NUCLEAR MEDICINE HEPATOBILIARY IMAGING  Technique:  Sequential images of the abdomen were obtained out to 90 minutes following intravenous administration of radiopharmaceutical. Due to nonvisualization of the gallbladder, the patient was injected with an additional 1.0 mCi technetium [id number] and slow intravenous infusion of 2.2 mg morphine was administered, while sequential  images were continued for another 30 minutes.  Radiopharmaceutical:  [id number]  Comparison:  None.  Findings: There is prompt radiopharmaceutical uptake by the liver. Liver is normal appearance.  There is prompt biliary excretion activity, with activity initially seen within the duodenum at 15 minutes.  Reflux of biliary activity into the stomach is seen which showed progression over 90 minutes.  No gallbladder activity is identified either before or following intravenous infusion of morphine.  IMPRESSION:  1.  Nonvisualization of gallbladder, consistent with cystic duct obstruction. 2.  Prominent bile reflux into the stomach. 3.  No evidence of biliary obstruction.  Original Report Authenticated By: JOHNMarlaine HindD.   Us [place] Complete  11/06/2011  *RADIOLOGY REPORT*  Clinical Data:  Hypertension and diabetes.  Abdominal pain.  ABDOMINAL ULTRASOUND COMPLETE  Comparison:  None.  Findings:  Gallbladder:  No gallstones, gallbladder wall thickening, or pericholecystic fluid.  Negative sonographic Murphy's sign.  Common Bile Duct:  Within normal limits in caliber. Measures 4.8 mm.  Liver: No focal mass lesion identified.  Within normal limits in parenchymal echogenicity.  IVC:  Appears normal.  Pancreas:  No abnormality identified.  Spleen:  Within  normal limits in size and echotexture.  Right kidney:  Normal in size and parenchymal echogenicity.  No evidence of mass or hydronephrosis.  Left kidney:  Normal in size and parenchymal echogenicity.  No evidence of mass or hydronephrosis.  Abdominal Aorta:  No aneurysm identified.  IMPRESSION: Negative abdominal ultrasound.  Original Report Authenticated By: Duayne Cal, M.D.   Ct Abdomen Pelvis W Contrast  11/22/2011  *RADIOLOGY REPORT*  Clinical Data: Mid abdominal pain  CT ABDOMEN AND PELVIS WITH CONTRAST  Technique:  Multidetector CT imaging of the abdomen and pelvis was performed following the standard protocol during bolus administration of  intravenous contrast.  Contrast: 163m OMNIPAQUE IOHEXOL 300 MG/ML  SOLN  Comparison: 11/22/2011 radiograph, 11/11/2011 CT  Findings: Limited images through the lung bases demonstrate no significant appreciable abnormality. The heart size is within normal limits. No pleural or pericardial effusion.  Significant amount of pneumoperitoneum.  Interval cholecystectomy. Small amount of fluid and air at the surgical bed.  No loculated fluid collection.  Mild intrahepatic biliary ductal prominence to the level of the ampulla.  Mild main pancreatic duct dilatation is similar to prior.  Unremarkable spleen, adrenal glands, and kidneys.  No hydronephrosis or hydroureter.  Contrast within the colon from the previous examination. No bowel obstruction.  Interloop gas along the left hemiabdomen.  Small amount of free fluid within the pelvis dependently measures slightly higher than simple fluid.  Distended, thin-walled bladder.  Unremarkable CT appearance to the uterus and adnexa.  Normal caliber aorta and branch vessels.  Postoperative changes periumbilical.  Subcutaneous fat stranding may be reactive or infectious. No associated fluid collection.  No acute osseous finding.  IMPRESSION: Extensive pneumoperitoneum.  The source of which is uncertain however bowel injury not excluded. The quantity is more than typically seen 1 week postoperative.  There is a small air and fluid collection in the surgical bed which is not necessarily unexpected in the recently postoperative state.  Small amount of free fluid within the pelvis dependently measures slightly higher than simple fluid.   Original Report Authenticated By: ASuanne Marker M.D.    Ct Abdomen Pelvis W Contrast  11/11/2011  *RADIOLOGY REPORT*  Clinical Data: Abdominal pain, nausea and vomiting.  CT ABDOMEN AND PELVIS WITH CONTRAST  Technique:  Multidetector CT imaging of the abdomen and pelvis was performed following the standard protocol during bolus administration of  intravenous contrast.  Contrast: 88mOMNIPAQUE IOHEXOL 300 MG/ML  SOLN  Comparison: 10/22/2011  Findings: The liver, gallbladder, kidneys, spleen and adrenal glands are unremarkable.  Ingested oral contrast is present in a distended stomach and in the duodenum.  Very little contrast is present in small bowel.  This may suggests some degree of underlying gastroparesis.  There is no evidence of small bowel dilatation or colonic abnormality.  No free intraperitoneal air.  The pancreas is atrophic.  No evidence of focal inflammatory process, fluid collection or abscess.  The bladder is unremarkable. No hernias are identified.  Edema in the subcutaneous fat noted previously is less prominent. No focal masses or enlarged lymph nodes are identified.  Bony structures are unremarkable.  IMPRESSION:  1.  Gastric distention with oral contrast.  This may suggest some degree of gastroparesis.  No masses are identified. 2.  Less prominent subcutaneous edema.  Original Report Authenticated By: GLAzzie RoupM.D.   Dg Chest Port 1 View  11/11/2011  *RADIOLOGY REPORT*  Clinical Data: Chest pain and shortness of breath.  PORTABLE CHEST - 1 VIEW  Comparison: CT chest 11/07/2011 and PA and lateral chest 09/18/2011.  Findings: Lungs are clear.  Heart size is normal.  No pneumothorax or pleural fluid.  IMPRESSION: Negative chest.  Original Report Authenticated By: Arvid Right. D'ALESSIO, M.D.   Dg Abd Acute W/chest  11/22/2011  *RADIOLOGY REPORT*  Clinical Data: 21 year old female with epigastric pain. Gallbladder surgery 7 days ago.  ACUTE ABDOMEN SERIES (ABDOMEN 2 VIEW & CHEST 1 VIEW)  Comparison: Intraoperative cholangiogram 11/15/2011 and earlier.  Findings: Good lung volumes, but moderate volume of pneumoperitoneum subjacent to the diaphragm.  No pneumothorax or evidence of pneumomediastinum.  Other mediastinal contours are within normal limits.  Visualized tracheal air column is within normal limits.  The lungs are clear.   Oral contrast mixed with stool in the left colon. Nonobstructed bowel gas pattern.  Right upper quadrant surgical clips with evidence of gas in the gallbladder fossa or under the liver. No acute osseous abnormality identified.  IMPRESSION: 1.  Moderate volume of pneumoperitoneum, seems beyond that expected for 7 days postop from cholecystectomy. 2.  Nonobstructed bowel gas pattern, contrast mixed with stool in the left colon. 3. No acute cardiopulmonary abnormality.   Original Report Authenticated By: Randall An, M.D.     Scheduled Meds:   . acetaminophen  1,000 mg Intravenous Q6H  . bisacodyl  10 mg Rectal Once  . enalapril  5 mg Oral QHS  . heparin  5,000 Units Subcutaneous Q8H  . insulin aspart  0-9 Units Subcutaneous Q4H  . insulin glargine  8 Units Subcutaneous QHS  . lip balm  1 application Topical BID  . metoCLOPramide  5 mg Oral TID AC & HS  . metoprolol  5 mg Intravenous Q6H  . pantoprazole (PROTONIX) IV  40 mg Intravenous Q12H  . DISCONTD: enalapril  10 mg Oral QHS  . DISCONTD: enalapril  2.5 mg Oral QHS  . DISCONTD: hydrALAZINE  5 mg Intravenous Q8H   Continuous Infusions:   . dextrose 5 % and 0.45 % NaCl with KCl 40 mEq/L 100 mL/hr at 11/27/11 0730    Time spent: > 20 minutes    Marchelle Rinella  [facility] Pager [phone]. If 8PM-8AM, please contact night-coverage at [email or web address], password [facility] 11/27/2011, 12:22 PM  LOS: 5 days

## 2011-11-27 NOTE — Progress Notes (Signed)
4 Days Post-Op  Subjective: She is sad, tearful, and depressed, concerned over job loss, not in school. Scared of everything.  Objective: Vital signs in last 24 hours: Temp:  [98.2 F (36.8 C)-98.8 F (37.1 C)] 98.8 F (37.1 C) (08/29 0543) Pulse Rate:  [106-112] 106  (08/29 0543) Resp:  [18-20] 19  (08/29 0543) BP: (127-162)/(78-99) 127/78 mmHg (08/29 0543) SpO2:  [99 %-100 %] 99 % (08/29 0543) Last BM Date: 11/14/11 (states not eating-since last surgery on 8-16)  NG resumed, she does not have anything PO recorded.  Diet: clears, afebrile BP improving, glucose is OK, but not eating labs this am are normal  Intake/Output from previous day: 08/28 0701 - 08/29 0700 In: 3920.3 [I.V.:3431.3; NG/GT:75; IV Piggyback:414] Out: 3402.5 [Urine:3000; Emesis/NG output:400; Drains:2.5] Intake/Output this shift:    General appearance: alert and tearful, depressed. Resp: clear to auscultation bilaterally GI: soft, drain is clear, few BS, no BM.  Lab Results:   [facility] 11/27/11 0440  WBC 3.4*  HGB 12.5  HCT 34.6*  PLT 327    BMET  Basename 11/27/11 0440  NA 135  K 3.8  CL 95*  CO2 28  GLUCOSE 140*  BUN 3*  CREATININE 0.38*  CALCIUM 9.6   PT/INR No results found for this basename: LABPROT:2,INR:2 in the last 72 hours   Lab 11/27/11 0440 11/24/11 0445 11/22/11 1950  AST 12 12 14   ALT 11 11 14   ALKPHOS 59 46 57  BILITOT 0.9 0.4 0.5  PROT 7.2 6.3 7.7  ALBUMIN 3.5 3.2* 4.1     Lipase     Component Value Date/Time   LIPASE 27 11/24/2011 0445     Studies/Results: No results found.  Medications:    . acetaminophen  1,000 mg Intravenous Q6H  . enalapril  2.5 mg Oral QHS  . heparin  5,000 Units Subcutaneous Q8H  . insulin aspart  0-9 Units Subcutaneous Q4H  . insulin glargine  8 Units Subcutaneous QHS  . lip balm  1 application Topical BID  . metoprolol  5 mg Intravenous Q6H  . pantoprazole (PROTONIX) IV  40 mg Intravenous Q12H  . DISCONTD: enalapril  10 mg  Oral QHS  . DISCONTD: hydrALAZINE  5 mg Intravenous Q8H    Assessment/Plan Free air, abdominal pain post lap cholecystectomy 11/15/11 Dr.Gross; s/p diagnostic laparoscopy,11/23/11, with nothing abnormal found as source of air.  Diabetes mellitus type 1 since age 70. HBA1C 12.9  Hypertension, currently with poor control  Hx of recurrent DKA Last hospitalization 09/17/11.    Post op ileus,  Using allot of pain meds.  Plan:  I have removed her NG, work on mobilizing, try to get her to eat more, I will give her a dulcolax this am and we will see how she does.  Add some reglan. And hydrocodone for pain.  Try to get her off dilaudid.        LOS: 5 days    Erika Slaby 11/27/2011

## 2011-11-27 NOTE — Progress Notes (Signed)
Phenergan and Zofran administered via IV for pt c/o nausea. Pt had no relief. Pt stated she "felt full". Pt hooked back up to LIWS per order. Within 30 minutes, there was output from NGT. Pt stated she felt slightly better. Will continue to monitor.

## 2011-11-28 MED ORDER — ACETAMINOPHEN 500 MG PO TABS
1000.0000 mg | ORAL_TABLET | Freq: Three times a day (TID) | ORAL | Status: DC
  Administered 2011-11-28: 1000 mg via ORAL
  Administered 2011-11-28: 500 mg via ORAL
  Administered 2011-11-29 – 2011-12-06 (×9): 1000 mg via ORAL
  Filled 2011-11-28 (×31): qty 2

## 2011-11-28 MED ORDER — METOCLOPRAMIDE HCL 10 MG PO TABS
10.0000 mg | ORAL_TABLET | Freq: Four times a day (QID) | ORAL | Status: DC
  Administered 2011-11-28 – 2011-12-03 (×18): 10 mg via ORAL
  Filled 2011-11-28 (×21): qty 1

## 2011-11-28 MED ORDER — METFORMIN HCL 500 MG PO TABS
500.0000 mg | ORAL_TABLET | Freq: Two times a day (BID) | ORAL | Status: DC
  Filled 2011-11-28 (×4): qty 1

## 2011-11-28 MED ORDER — PANTOPRAZOLE SODIUM 40 MG PO TBEC
40.0000 mg | DELAYED_RELEASE_TABLET | Freq: Every day | ORAL | Status: DC
  Administered 2011-11-28 – 2011-12-06 (×9): 40 mg via ORAL
  Filled 2011-11-28 (×8): qty 1

## 2011-11-28 MED ORDER — BOOST / RESOURCE BREEZE PO LIQD
1.0000 | Freq: Three times a day (TID) | ORAL | Status: DC
  Administered 2011-11-28 – 2011-12-02 (×5): 1 via ORAL
  Filled 2011-11-28 (×23): qty 1

## 2011-11-28 MED ORDER — OXYCODONE HCL 5 MG PO TABS
5.0000 mg | ORAL_TABLET | ORAL | Status: DC | PRN
  Administered 2011-11-29 – 2011-12-04 (×11): 10 mg via ORAL
  Filled 2011-11-28 (×11): qty 2

## 2011-11-28 MED ORDER — ASPIRIN 325 MG PO TABS
325.0000 mg | ORAL_TABLET | Freq: Every day | ORAL | Status: DC
  Administered 2011-11-28 – 2011-11-29 (×2): 325 mg via ORAL
  Filled 2011-11-28 (×2): qty 1

## 2011-11-28 MED ORDER — ADULT MULTIVITAMIN W/MINERALS CH
1.0000 | ORAL_TABLET | Freq: Every day | ORAL | Status: DC
  Administered 2011-11-29 – 2011-12-04 (×2): 1 via ORAL
  Filled 2011-11-28 (×9): qty 1

## 2011-11-28 MED ORDER — ENALAPRIL MALEATE 10 MG PO TABS
10.0000 mg | ORAL_TABLET | Freq: Every day | ORAL | Status: DC
  Administered 2011-11-28 – 2011-12-05 (×7): 10 mg via ORAL
  Filled 2011-11-28 (×9): qty 1

## 2011-11-28 NOTE — Progress Notes (Signed)
Patient ID: ANHTHU PERDEW, female   DOB: Jul 09, 1990, 21 y.o.   MRN: [id number] 5 Days Post-Op  Subjective: She remains very soft spoken and timid, not tearful this am overall affect slightly better. Tolerated clears well. No N/V, bloating. Still c/o of "pain" discussed timing of requesting pain meds with her as she has been waiting until pain is at a 7/10 level and I think that this is why she is not getting adequate pain control. Will continue to monitor this as clinically she is improving otherwise.  Objective: Vital signs in last 24 hours: Temp:  [98.1 F (36.7 C)-99.3 F (37.4 C)] 99.2 F (37.3 C) (08/30 0601) Pulse Rate:  [119-131] 119  (08/30 0601) Resp:  [18-20] 18  (08/30 0601) BP: (120-141)/(87-100) 120/91 mmHg (08/30 0601) SpO2:  [97 %-100 %] 100 % (08/30 0601) Last BM Date: 11/27/11   Intake/Output from previous day: 08/29 0701 - 08/30 0700 In: 120 [P.O.:120] Out: 1052 [Urine:1050; Stool:2] Intake/Output this shift:    General appearance: A/A/O, still with timid soft spoken affect. Chest: CTA bilaterally Abdomen: flat, soft, JP drain in place (< 3 cc/24 hr recorded serous fluid in bulb), +BS,BM,flatus.  VSS,still with low grade temp 99.2 WBC were 3.4 on 11/27/11. No labs today. BG stable.   Lab Results:   [facility] 11/27/11 0440  WBC 3.4*  HGB 12.5  HCT 34.6*  PLT 327    BMET  Basename 11/27/11 0440  NA 135  K 3.8  CL 95*  CO2 28  GLUCOSE 140*  BUN 3*  CREATININE 0.38*  CALCIUM 9.6   PT/INR No results found for this basename: LABPROT:2,INR:2 in the last 72 hours   Lab 11/27/11 0440 11/24/11 0445 11/22/11 1950  AST 12 12 14   ALT 11 11 14   ALKPHOS 59 46 57  BILITOT 0.9 0.4 0.5  PROT 7.2 6.3 7.7  ALBUMIN 3.5 3.2* 4.1     Lipase     Component Value Date/Time   LIPASE 27 11/24/2011 0445     Studies/Results: No results found.  Medications:    . enalapril  5 mg Oral QHS  . heparin  5,000 Units Subcutaneous Q8H  . insulin aspart  0-9  Units Subcutaneous Q4H  . insulin glargine  8 Units Subcutaneous QHS  . lip balm  1 application Topical BID  . metoCLOPramide  5 mg Oral TID AC & HS  . pantoprazole (PROTONIX) IV  40 mg Intravenous Q12H  . DISCONTD: enalapril  2.5 mg Oral QHS  . DISCONTD: metoprolol  5 mg Intravenous Q6H    Assessment/Plan Free air, abdominal pain post lap cholecystectomy 11/15/11 Dr.Gross; s/p diagnostic laparoscopy,11/23/11, with nothing abnormal found as source of air.  Diabetes mellitus type 1 since age 52. HBA1C 12.9  Hypertension, currently with poor control  Hx of recurrent DKA Last hospitalization 09/17/11.    Post op ileus,  Still needing pain meds  Plan:  1. Mobilize, IS 2. Patient was educated as to timing of taking pain meds  3. Advance to full liquids, continue IVF for now until adequate po intake. 4. ? D/C JP drain (will discuss with Dr. [name]) 5. Management of DM and other medical issues per medicine team and this is appreciated! 6. ? Discharge soon       LOS: 6 days    Lashaunta Sicard 11/28/2011

## 2011-11-28 NOTE — Progress Notes (Signed)
Nutrition Follow-up  Intervention: Resource Breeze TID per pt request. Will monitor intake.   Diet Order:  Full liquid  - NGT d/c yesterday. Pt reports tolerating full liquid diet with improved appetite. Observed pt ate 100% of full liquid lunch. No educational needs. Met with diabetic coordinator yesterday and received information on how to get an insulin pump. CBGs improving.    Meds: Scheduled Meds:   . acetaminophen  1,000 mg Oral TID  . aspirin  325 mg Oral Daily  . enalapril  10 mg Oral QHS  . heparin  5,000 Units Subcutaneous Q8H  . insulin aspart  0-9 Units Subcutaneous Q4H  . insulin glargine  8 Units Subcutaneous QHS  . lip balm  1 application Topical BID  . metFORMIN  500 mg Oral BID WC  . metoCLOPramide  10 mg Oral QID  . multivitamin with minerals  1 tablet Oral Daily  . pantoprazole  40 mg Oral Q1200  . DISCONTD: enalapril  5 mg Oral QHS  . DISCONTD: metoCLOPramide  5 mg Oral TID AC & HS  . DISCONTD: pantoprazole (PROTONIX) IV  40 mg Intravenous Q12H   Continuous Infusions:   . dextrose 5 % and 0.45 % NaCl with KCl 40 mEq/L 50 mL/hr at 11/27/11 2212   PRN Meds:.bisacodyl, hydrALAZINE, HYDROmorphone (DILAUDID) injection, magic mouthwash, menthol-cetylpyridinium, metoprolol, ondansetron (ZOFRAN) IV, ondansetron, oxyCODONE, phenol, promethazine, DISCONTD: acetaminophen, DISCONTD: HYDROcodone-acetaminophen  Labs:  CMP     Component Value Date/Time   NA 135 11/27/2011 0440   K 3.8 11/27/2011 0440   CL 95* 11/27/2011 0440   CO2 28 11/27/2011 0440   GLUCOSE 140* 11/27/2011 0440   BUN 3* 11/27/2011 0440   CREATININE 0.38* 11/27/2011 0440   CALCIUM 9.6 11/27/2011 0440   PROT 7.2 11/27/2011 0440   ALBUMIN 3.5 11/27/2011 0440   AST 12 11/27/2011 0440   ALT 11 11/27/2011 0440   ALKPHOS 59 11/27/2011 0440   BILITOT 0.9 11/27/2011 0440   GFRNONAA >90 11/27/2011 0440   GFRAA >90 11/27/2011 0440   CBG (last 3)   Basename 11/27/11 2005 11/27/11 1618 11/27/11 1139  GLUCAP 122* 183*  183*      Intake/Output Summary (Last 24 hours) at 11/28/11 1434 Last data filed at 11/27/11 1500  Gross per 24 hour  Intake      0 ml  Output    300 ml  Net   -300 ml   Last BM - 8/29  Weight Status:   8/24 115 lb 8/26 121 lb 6.4 oz  Estimated needs:   1850-2050 calories 70-90g protein  Nutrition Dx: Inadequate oral intake - improved   Goal:  1. Diet advancement as medically able to meet > 90% of estimated energy needs - diet advancement met, likely not meeting >90% of energy needs yet.  2. Prevent further weight loss - met, pt's weight is up 6 pounds since admission.    Monitor: Weights, labs, intake, CBGs  Dietitian# [phone]

## 2011-11-28 NOTE — Progress Notes (Signed)
[facility] PROGRESS NOTE  Arlyce Circle Lacko BSW:[id number] DOB: Oct 24, 1990 DOA: 11/22/2011 PCP: [name], NP  Assessment/Plan: 1. IDDM: A1C 6.1; Patient eating a little better today. Will continue SSI and lantus at current dose. Patient interested on insulin pump at discharge. Will continue adjusting her insulin base on diet advancement.  2. HTN: Continue adjusting pain meds. Will also resume full home dose of vasotec.   3-Pneumoperitoneum and abd pain: per primary team; [facility].   4. DVT: Heparin   5. GERD: Continue IV protonix  6- Depression: Cotninue celexa.  7- Gastroparesis: will continue Reglan.  8-Tachycardia: secondary to pain. Will be ok to use PRN b-blocker, but avoid schedule doses due to risk of losing ability to recognize hypoglycemic symptoms.  Code Status: Full Family Communication: husband at bedside Disposition Plan: home when stable   Brief narrative: 21 year old insulin-dependent diabetic who one week ago underwent an apparently uneventful laparoscopic cholecystectomy with cholangiogram. She had presented to the hospital with recurrent and persistent abdominal pain; initial workup demonstrated pneumoperitoneum and patient was admitted by [facility] for further evaluation and treatment. Patient currently S/P exploratory laparoscopic w/o any abnormalities to explain pneumoperitoneum. Surgery has consult [place] for assistance controlling her HTN and also IDDM, especially now that patient is NPO. Denies CP, SO, N/V, Vision changes or fever. Patient reports still pain in her abdomen (epigastric area mainly).    Antibiotics:  none  HPI/Subjective: Afebrile; feeling better. Eating a little mor today of CL diet. Denies vomiting.     Objective: Filed Vitals:   11/27/11 0543 11/27/11 1409 11/27/11 2138 11/28/11 0601  BP: 127/78 141/100 125/87 120/91  Pulse: 106 130 131 119  Temp: 98.8 F (37.1 C) 98.1 F (36.7 C) 99.3 F (37.4 C) 99.2 F (37.3 C)  TempSrc: Oral Oral  Oral Oral  Resp: 19 20 18 18   Height:      Weight:      SpO2: 99% 97% 99% 100%    Intake/Output Summary (Last 24 hours) at 11/28/11 1244 Last data filed at 11/27/11 1500  Gross per 24 hour  Intake      0 ml  Output    300 ml  Net   -300 ml   Filed Weights   11/22/11 1912 11/23/11 0330 11/24/11 1612  Weight: 52.164 kg (115 lb) 60.782 kg (134 lb) 55.067 kg (121 lb 6.4 oz)    Exam:   General:  Flat affect, NAD; feeling nauseated. NGT removed  Cardiovascular: S1 and S2; no rubs or gallops  Respiratory: CTA  Abdomen: soft, no guarding; positive BS and no distension  Neuro: non focal  Data Reviewed: Basic Metabolic Panel:  Lab 46/65/99 0440 11/25/11 1005 11/24/11 0445 11/22/11 1950  NA 135 -- 134* 135  K 3.8 -- 3.6 3.6  CL 95* -- 99 94*  CO2 28 -- 27 28  GLUCOSE 140* -- 125* 284*  BUN 3* -- <3* 11  CREATININE 0.38* -- 0.40* 0.41*  CALCIUM 9.6 -- 8.9 9.7  MG -- 1.9 1.5 --  PHOS -- -- -- --   Liver Function Tests:  Lab 11/27/11 0440 11/24/11 0445 11/22/11 1950  AST 12 12 14   ALT 11 11 14   ALKPHOS 59 46 57  BILITOT 0.9 0.4 0.5  PROT 7.2 6.3 7.7  ALBUMIN 3.5 3.2* 4.1    Lab 11/24/11 0445 11/22/11 1950  LIPASE 27 31  AMYLASE -- --   CBC:  Lab 11/27/11 0440 11/24/11 0445 11/22/11 1950  WBC 3.4* 4.9 4.0  NEUTROABS -- -- --  HGB 12.5 11.2* 13.9  HCT 34.6* 31.2* 37.6  MCV 84.8 83.0 82.6  PLT 327 262 275   BNP (last 3 results)  Basename 11/14/11 1605  PROBNP 20.0   CBG:  Lab 11/27/11 2005 11/27/11 1618 11/27/11 1139 11/27/11 0732 11/27/11 0401  GLUCAP 122* 183* 183* 179* 114*      Studies: Dg Cholangiogram Operative  11/15/2011  *RADIOLOGY REPORT*  Clinical Data:   Evaluate for common bile duct stone. Cholecystectomy.  INTRAOPERATIVE CHOLANGIOGRAM  Technique:  Cholangiographic images from the C-arm fluoroscopic device were submitted for interpretation post-operatively.  Please see the procedural report for the amount of contrast and the  fluoroscopy time utilized.  Comparison:  Nuclear medicine study 11/14/2011.  Findings:  Normal caliber of the common duct, without persistent filling defect to suggest choledocholithiasis.  No contrast extravasation.  Free spill of contrast into the descending duodenum.  IMPRESSION: No evidence of choledocholithiasis.  Original Report Authenticated By: Areta Haber, M.D.   Ct Angio Chest W/cm &/or Wo Cm  11/07/2011  *RADIOLOGY REPORT*  Clinical Data: Tachycardia.  CT ANGIOGRAPHY CHEST  Technique:  Multidetector CT imaging of the chest using the standard protocol during bolus administration of intravenous contrast. Multiplanar reconstructed images including MIPs were obtained and reviewed to evaluate the vascular anatomy.  Contrast: 122m OMNIPAQUE IOHEXOL 350 MG/ML SOLN  Comparison: None.  Findings: Technically adequate study with good opacification of the central and segmental pulmonary arteries.  No focal filling defects demonstrated.  No evidence of significant pulmonary embolus.  Normal heart size.  Normal caliber thoracic aorta.  No significant lymphadenopathy in the chest.  The esophagus is decompressed. Visualized portions of the upper abdominal organs are grossly unremarkable.  No pleural effusions.  No focal airspace consolidation or interstitial changes in the lungs.  No pneumothorax.  There is a tiny bleb or cyst in the right apex.  Airways appear patent. Normal alignment of the thoracic vertebrae.  IMPRESSION: No evidence of significant pulmonary embolus.  Original Report Authenticated By: WNeale Burly M.D.   Nm Hepatobiliary  11/14/2011  *RADIOLOGY REPORT*  Clinical Data:  NUCLEAR MEDICINE HEPATOBILIARY IMAGING  Technique:  Sequential images of the abdomen were obtained out to 90 minutes following intravenous administration of radiopharmaceutical. Due to nonvisualization of the gallbladder, the patient was injected with an additional 1.0 mCi technetium 9722mholetec and slow intravenous  infusion of 2.2 mg morphine was administered, while sequential images were continued for another 30 minutes.  Radiopharmaceutical:  5.22m52mTc-69m34mletec  Comparison:  None.  Findings: There is prompt radiopharmaceutical uptake by the liver. Liver is normal appearance.  There is prompt biliary excretion activity, with activity initially seen within the duodenum at 15 minutes.  Reflux of biliary activity into the stomach is seen which showed progression over 90 minutes.  No gallbladder activity is identified either before or following intravenous infusion of morphine.  IMPRESSION:  1.  Nonvisualization of gallbladder, consistent with cystic duct obstruction. 2.  Prominent bile reflux into the stomach. 3.  No evidence of biliary obstruction.  Original Report Authenticated By: JOHNMarlaine HindD.   Us [place] Complete  11/06/2011  *RADIOLOGY REPORT*  Clinical Data:  Hypertension and diabetes.  Abdominal pain.  ABDOMINAL ULTRASOUND COMPLETE  Comparison:  None.  Findings:  Gallbladder:  No gallstones, gallbladder wall thickening, or pericholecystic fluid.  Negative sonographic Murphy's sign.  Common Bile Duct:  Within normal limits in caliber. Measures 4.8 mm.  Liver: No focal mass lesion identified.  Within normal limits  in parenchymal echogenicity.  IVC:  Appears normal.  Pancreas:  No abnormality identified.  Spleen:  Within normal limits in size and echotexture.  Right kidney:  Normal in size and parenchymal echogenicity.  No evidence of mass or hydronephrosis.  Left kidney:  Normal in size and parenchymal echogenicity.  No evidence of mass or hydronephrosis.  Abdominal Aorta:  No aneurysm identified.  IMPRESSION: Negative abdominal ultrasound.  Original Report Authenticated By: Duayne Cal, M.D.   Ct Abdomen Pelvis W Contrast  11/22/2011  *RADIOLOGY REPORT*  Clinical Data: Mid abdominal pain  CT ABDOMEN AND PELVIS WITH CONTRAST  Technique:  Multidetector CT imaging of the abdomen and pelvis was performed  following the standard protocol during bolus administration of intravenous contrast.  Contrast: 152m OMNIPAQUE IOHEXOL 300 MG/ML  SOLN  Comparison: 11/22/2011 radiograph, 11/11/2011 CT  Findings: Limited images through the lung bases demonstrate no significant appreciable abnormality. The heart size is within normal limits. No pleural or pericardial effusion.  Significant amount of pneumoperitoneum.  Interval cholecystectomy. Small amount of fluid and air at the surgical bed.  No loculated fluid collection.  Mild intrahepatic biliary ductal prominence to the level of the ampulla.  Mild main pancreatic duct dilatation is similar to prior.  Unremarkable spleen, adrenal glands, and kidneys.  No hydronephrosis or hydroureter.  Contrast within the colon from the previous examination. No bowel obstruction.  Interloop gas along the left hemiabdomen.  Small amount of free fluid within the pelvis dependently measures slightly higher than simple fluid.  Distended, thin-walled bladder.  Unremarkable CT appearance to the uterus and adnexa.  Normal caliber aorta and branch vessels.  Postoperative changes periumbilical.  Subcutaneous fat stranding may be reactive or infectious. No associated fluid collection.  No acute osseous finding.  IMPRESSION: Extensive pneumoperitoneum.  The source of which is uncertain however bowel injury not excluded. The quantity is more than typically seen 1 week postoperative.  There is a small air and fluid collection in the surgical bed which is not necessarily unexpected in the recently postoperative state.  Small amount of free fluid within the pelvis dependently measures slightly higher than simple fluid.   Original Report Authenticated By: ASuanne Marker M.D.    Ct Abdomen Pelvis W Contrast  11/11/2011  *RADIOLOGY REPORT*  Clinical Data: Abdominal pain, nausea and vomiting.  CT ABDOMEN AND PELVIS WITH CONTRAST  Technique:  Multidetector CT imaging of the abdomen and pelvis was performed  following the standard protocol during bolus administration of intravenous contrast.  Contrast: 874mOMNIPAQUE IOHEXOL 300 MG/ML  SOLN  Comparison: 10/22/2011  Findings: The liver, gallbladder, kidneys, spleen and adrenal glands are unremarkable.  Ingested oral contrast is present in a distended stomach and in the duodenum.  Very little contrast is present in small bowel.  This may suggests some degree of underlying gastroparesis.  There is no evidence of small bowel dilatation or colonic abnormality.  No free intraperitoneal air.  The pancreas is atrophic.  No evidence of focal inflammatory process, fluid collection or abscess.  The bladder is unremarkable. No hernias are identified.  Edema in the subcutaneous fat noted previously is less prominent. No focal masses or enlarged lymph nodes are identified.  Bony structures are unremarkable.  IMPRESSION:  1.  Gastric distention with oral contrast.  This may suggest some degree of gastroparesis.  No masses are identified. 2.  Less prominent subcutaneous edema.  Original Report Authenticated By: GLAzzie RoupM.D.   Dg Chest Port 1 View  11/11/2011  *  RADIOLOGY REPORT*  Clinical Data: Chest pain and shortness of breath.  PORTABLE CHEST - 1 VIEW  Comparison: CT chest 11/07/2011 and PA and lateral chest 09/18/2011.  Findings: Lungs are clear.  Heart size is normal.  No pneumothorax or pleural fluid.  IMPRESSION: Negative chest.  Original Report Authenticated By: Arvid Right. D'ALESSIO, M.D.   Dg Abd Acute W/chest  11/22/2011  *RADIOLOGY REPORT*  Clinical Data: 21 year old female with epigastric pain. Gallbladder surgery 7 days ago.  ACUTE ABDOMEN SERIES (ABDOMEN 2 VIEW & CHEST 1 VIEW)  Comparison: Intraoperative cholangiogram 11/15/2011 and earlier.  Findings: Good lung volumes, but moderate volume of pneumoperitoneum subjacent to the diaphragm.  No pneumothorax or evidence of pneumomediastinum.  Other mediastinal contours are within normal limits.  Visualized  tracheal air column is within normal limits.  The lungs are clear.  Oral contrast mixed with stool in the left colon. Nonobstructed bowel gas pattern.  Right upper quadrant surgical clips with evidence of gas in the gallbladder fossa or under the liver. No acute osseous abnormality identified.  IMPRESSION: 1.  Moderate volume of pneumoperitoneum, seems beyond that expected for 7 days postop from cholecystectomy. 2.  Nonobstructed bowel gas pattern, contrast mixed with stool in the left colon. 3. No acute cardiopulmonary abnormality.   Original Report Authenticated By: Joni Fears III, M.D.     Scheduled Meds:    . enalapril  10 mg Oral QHS  . heparin  5,000 Units Subcutaneous Q8H  . insulin aspart  0-9 Units Subcutaneous Q4H  . insulin glargine  8 Units Subcutaneous QHS  . lip balm  1 application Topical BID  . metoCLOPramide  5 mg Oral TID AC & HS  . pantoprazole (PROTONIX) IV  40 mg Intravenous Q12H  . DISCONTD: enalapril  5 mg Oral QHS   Continuous Infusions:    . dextrose 5 % and 0.45 % NaCl with KCl 40 mEq/L 50 mL/hr at 11/27/11 2212    Time spent: > 20 minutes    Arika Mainer  [facility] Pager [phone]. If 8PM-8AM, please contact night-coverage at [email or web address], password [facility] 11/28/2011, 12:44 PM  LOS: 6 days

## 2011-11-28 NOTE — Progress Notes (Signed)
Try standing pain regimen with bowel regimen Ask therapists to see & help encourage/supervise recovery

## 2011-11-29 LAB — GLUCOSE, CAPILLARY
Glucose-Capillary: 151 mg/dL — ABNORMAL HIGH (ref 70–99)
Glucose-Capillary: 250 mg/dL — ABNORMAL HIGH (ref 70–99)
Glucose-Capillary: 189 mg/dL — ABNORMAL HIGH (ref 70–99)

## 2011-11-29 MED ORDER — ASPIRIN 81 MG PO CHEW
81.0000 mg | CHEWABLE_TABLET | Freq: Every day | ORAL | Status: DC
  Administered 2011-11-30 – 2011-12-06 (×7): 81 mg via ORAL
  Filled 2011-11-29 (×7): qty 1

## 2011-11-29 MED ORDER — CITALOPRAM HYDROBROMIDE 20 MG PO TABS
20.0000 mg | ORAL_TABLET | Freq: Every day | ORAL | Status: DC
  Administered 2011-11-29 – 2011-12-06 (×8): 20 mg via ORAL
  Filled 2011-11-29 (×8): qty 1

## 2011-11-29 MED ORDER — INSULIN GLARGINE 100 UNIT/ML ~~LOC~~ SOLN
12.0000 [IU] | Freq: Every day | SUBCUTANEOUS | Status: DC
  Administered 2011-11-29: 12 [IU] via SUBCUTANEOUS

## 2011-11-29 NOTE — Evaluation (Signed)
Occupational Therapy Evaluation Patient Details Name: [name] MRN: [id number] DOB: [birth year] Today's Date: 11/29/2011 Time: 7829-5621 OT Time Calculation (min): 15 min  OT Assessment / Plan / Recommendation Clinical Impression  Pt admitted with pneumoperitoneum after a laparoscopic lap chole one week ago.  She presents with abdominal pain, nausea, and generalized weakness.  Will follow to address self care in preparation to return home.  No follow up is anticipated.    OT Assessment  Patient needs continued OT Services    Follow Up Recommendations  No OT follow up    Barriers to Discharge      Equipment Recommendations  None recommended by OT    Recommendations for Other Services    Frequency  Min 2X/week    Precautions / Restrictions Precautions Precautions: Fall Restrictions Weight Bearing Restrictions: No   Pertinent Vitals/Pain     ADL  Eating/Feeding: Simulated;Independent Where Assessed - Eating/Feeding: Edge of bed Grooming: Performed;Wash/dry hands;Min guard Where Assessed - Grooming: Unsupported standing Upper Body Bathing: Simulated;Minimal assistance Where Assessed - Upper Body Bathing: Unsupported sitting Lower Body Bathing: Simulated;Minimal assistance Where Assessed - Lower Body Bathing: Unsupported sit to stand Upper Body Dressing: Simulated;Set up Where Assessed - Upper Body Dressing: Unsupported sitting Lower Body Dressing: Performed;Minimal assistance Where Assessed - Lower Body Dressing: Unsupported sitting Toilet Transfer: Performed;Minimal assistance Toilet Transfer Method: Sit to stand Toilet Transfer Equipment: Regular height toilet Toileting - Clothing Manipulation and Hygiene: Performed;Supervision/safety Where Assessed - Toileting Clothing Manipulation and Hygiene: Sit on 3-in-1 or toilet Transfers/Ambulation Related to ADLs: hand held assist to ambulate to bathroom, flexed posture ADL Comments: Pt limited by abdominal pain and  generalized weakness.    OT Diagnosis: Generalized weakness;Acute pain  OT Problem List: Decreased strength;Decreased activity tolerance;Impaired balance (sitting and/or standing);Pain OT Treatment Interventions: Self-care/ADL training;Patient/family education   OT Goals Acute Rehab OT Goals OT Goal Formulation: With patient Time For Goal Achievement: 12/06/11 Potential to Achieve Goals: Good ADL Goals Pt Will Perform Grooming: Independently;Standing at sink ADL Goal: Grooming - Progress: Goal set today Pt Will Perform Lower Body Bathing: Independently;Sit to stand in shower ADL Goal: Lower Body Bathing - Progress: Goal set today Pt Will Perform Lower Body Dressing: Sit to stand from bed;Independently ADL Goal: Lower Body Dressing - Progress: Goal set today Pt Will Transfer to Toilet: Ambulation;Regular height toilet;Independently ADL Goal: Toilet Transfer - Progress: Goal set today Pt Will Perform Tub/Shower Transfer: Tub transfer;Ambulation;with supervision ADL Goal: Tub/Shower Transfer - Progress: Goal set today Miscellaneous OT Goals Miscellaneous OT Goal #1: Pt will retrieve items necessary to perform ADL independently. OT Goal: Miscellaneous Goal #1 - Progress: Goal set today  Visit Information  Last OT Received On: 11/29/11 Assistance Needed: +1    Subjective Data  Subjective: "Can you help me to the bathroom?"   Prior Functioning  Vision/Perception  Home Living Lives With: Spouse Available Help at Discharge: Available PRN/intermittently Type of Home: House Home Access: Level entry Home Layout: Two level Alternate Level Stairs-Number of Steps: 14 Alternate Level Stairs-Rails: Right Bathroom Shower/Tub: Tub/shower unit;Curtain Bathroom Toilet: Standard Home Adaptive Equipment: None Prior Function Level of Independence: Independent Able to Take Stairs?: Yes Driving: Yes Vocation: Unemployed Communication Communication: No difficulties Dominant Hand: Right       Cognition  Overall Cognitive Status: Appears within functional limits for tasks assessed/performed Arousal/Alertness: Awake/alert Orientation Level: Appears intact for tasks assessed Behavior During Session: [facility] for tasks performed    Extremity/Trunk Assessment Right Upper Extremity Assessment RUE ROM/Strength/Tone: [facility] for tasks  assessed Left Upper Extremity Assessment LUE ROM/Strength/Tone: [facility] for tasks assessed Trunk Assessment Trunk Assessment: Other exceptions (difficulty standing erect due to abdominal pain)   Mobility  Shoulder Instructions  Bed Mobility Bed Mobility: Supine to Sit;Sitting - Scoot to [place] of Bed;Sit to Supine;Scooting to [facility] Supine to Sit: 6: Modified independent (Device/Increase time);With rails;HOB elevated Sitting - Scoot to Edge of Bed: 7: Independent Sit to Supine: HOB elevated;With rail;6: Modified independent (Device/Increase time) Scooting to [facility]: With rail;6: Modified independent (Device/Increase time) Transfers Transfers: Sit to Stand;Stand to Sit Sit to Stand: 4: Min guard;From bed;With upper extremity assist;From toilet Stand to Sit: 4: Min guard;To bed;With upper extremity assist;To toilet       Exercise     Balance     End of Session OT - End of Session Activity Tolerance: Patient limited by pain;Patient limited by fatigue Patient left: in bed;with call bell/phone within reach  GO     Evern Bio 11/29/2011, 4:09 PM [phone]

## 2011-11-29 NOTE — Plan of Care (Signed)
Problem: Phase II Progression Outcomes Goal: Pain controlled Outcome: Not Progressing Patient continues to complain of pain rated at 7 requiring IV Dilaudid every 4 hours in spite of scheduled Tylenol and attempting po OxyIR  Goal: Tolerating diet Outcome: Progressing Advanced to carb mod today

## 2011-11-29 NOTE — Progress Notes (Signed)
Patient continues to call for IV pain medication every 4 hours and asked the nurse to push normal saline after her pain medication "so it will work faster".  I informed her that this is against policy and that I could not do this.  Will continue to monitor and encourage use of po pain medications.

## 2011-11-29 NOTE — Progress Notes (Signed)
Report received from [name] Hamilton, RN. No change from initial pm assessment. Will continue to monitor and follow plan of care.  

## 2011-11-29 NOTE — Evaluation (Addendum)
Physical Therapy Evaluation Patient Details Name: [name] MRN: [id number] DOB: 25-Jun-1990 Today's Date: 11/29/2011 Time: 4098-1191 PT Time Calculation (min): 12 min  PT Assessment / Plan / Recommendation Clinical Impression  21 y.o. female admitted to [facility] with recurrent and persistent abdominal pain (1 week ago she had a laparoscopic cholecystectomy with cholangiogram). Initial workup demonstrated pneumoperitoneum and patient was admitted by [facility] for further evaluation and treatment.  She underwent a diagnostic laproscopy on 11/23/11.  She presents today with generalized weakness, difficulty walking and poor posture.  She has a very flat affect (h/o depression) and reports nausea, but was still willing to work with PT.      PT Assessment  Patient needs continued PT services    Follow Up Recommendations  No PT follow up    Barriers to Discharge        Equipment Recommendations  None recommended by PT    Recommendations for Other Services     Frequency Min 3X/week    Precautions / Restrictions     Pertinent Vitals/Pain No reports of pain, only nausea which she reports the RN just gave her IV medication      Mobility  Bed Mobility Bed Mobility: Supine to Sit;Sitting - Scoot to [place] of Bed;Sit to Supine;Scooting to [facility] Supine to Sit: 6: Modified independent (Device/Increase time);With rails;HOB elevated Sitting - Scoot to Edge of Bed: 7: Independent Sit to Supine: HOB elevated;With rail;6: Modified independent (Device/Increase time) Scooting to [facility]: With rail;6: Modified independent (Device/Increase time) Details for Bed Mobility Assistance: Educated pt on log roll for comfort during supine <-> sit, otherwise, relies heavily on railing to get to EOB.   Transfers Transfers: Sit to Stand;Stand to Sit Sit to Stand: 4: Min guard;From bed;With upper extremity assist Stand to Sit: 4: Min guard;To bed;With upper extremity assist Details for Transfer Assistance: min guard assist to  help steady pt for balance at her trunk Ambulation/Gait Ambulation/Gait Assistance: 4: Min assist Ambulation Distance (Feet): 120 Feet Assistive device: Other (Comment);1 person hand held assist (IV pole) Ambulation/Gait Assistance Details: min hand held assist on one side and pt holding IV pole on the other side.  Verbal cues to try to stretch as tall as she can.  Trunk flexed and trunk is getting ahead of her feet.  Dizzy upon standing, but able to continue.   Gait Pattern: Step-through pattern;Shuffle;Trunk flexed    Exercises     PT Diagnosis: Difficulty walking;Abnormality of gait;Generalized weakness  PT Problem List: Decreased strength;Decreased activity tolerance;Decreased balance;Decreased mobility PT Treatment Interventions: Gait training;Stair training;Functional mobility training;Therapeutic activities;Therapeutic exercise;Balance training;Neuromuscular re-education;Patient/family education   PT Goals Acute Rehab PT Goals PT Goal Formulation: With patient Time For Goal Achievement: 12/13/11 Potential to Achieve Goals: Good Pt will Roll Supine to Right Side: with modified independence PT Goal: Rolling Supine to Right Side - Progress: Goal set today Pt will Roll Supine to Left Side: with modified independence PT Goal: Rolling Supine to Left Side - Progress: Goal set today Pt will go Supine/Side to Sit: with modified independence PT Goal: Supine/Side to Sit - Progress: Goal set today Pt will go Sit to Supine/Side: with modified independence PT Goal: Sit to Supine/Side - Progress: Goal set today Pt will go Sit to Stand: with modified independence PT Goal: Sit to Stand - Progress: Goal set today Pt will go Stand to Sit: with modified independence PT Goal: Stand to Sit - Progress: Goal set today Pt will Ambulate: >150 feet;with modified independence PT Goal: Ambulate -  Progress: Goal set today Pt will Go Up / Down Stairs: Flight;with modified independence;with rail(s) PT Goal:  Up/Down Stairs - Progress: Goal set today  Visit Information  Last PT Received On: 11/29/11 Assistance Needed: +1    Subjective Data  Subjective: Pt mumbling low, difficult to hear, reports nausea just had IV nausea meds per pt.  Did not look like she ate much breakfast and she reports she has not had a BM today.     Prior Functioning  Home Living Lives With: Spouse Available Help at Discharge: Available PRN/intermittently Type of Home: House Home Access: Level entry Home Layout: Two level Alternate Level Stairs-Number of Steps: 14 Alternate Level Stairs-Rails: Right Bathroom Shower/Tub: Tub/shower unit;Curtain Bathroom Toilet: Standard Home Adaptive Equipment: None Prior Function Level of Independence: Independent Driving: Yes Vocation: Unemployed Communication Communication: Other (comment) (mumbles low, difficult to hear)    Cognition  Overall Cognitive Status: Appears within functional limits for tasks assessed/performed    Extremity/Trunk Assessment Right Lower Extremity Assessment RLE ROM/Strength/Tone: Deficits RLE ROM/Strength/Tone Deficits: grossly at least 3/5 per functional assessment Left Lower Extremity Assessment LLE ROM/Strength/Tone: Deficits LLE ROM/Strength/Tone Deficits: grossly 3/5 per functional assessment.  Trunk Assessment Trunk Assessment: Kyphotic (flexed trunk EOB and in standing, likely due to pain)   Balance    End of Session PT - End of Session Activity Tolerance: Patient limited by fatigue;Patient limited by pain;Treatment limited secondary to medical complications (Comment) (nausea and dizziness) Patient left: in bed;with call bell/phone within reach  GP     Teletha Petrea B. [place] Kretz, PT, DPT [phone]   11/29/2011, 11:23 AM

## 2011-11-29 NOTE — Progress Notes (Signed)
Patient continues to take Dilaudid every 4 hours, most always rates pain at a 7.  States the Dilaudid does bring the pain down to a 3.  Has not taken any of the Oxy IR that was written for yesterday.  Encouraged patient that she needs to start taking the Oxy IR to see if she can keep pain more evenly controlled than the every 4 hour Dilaudid.  Patient states she is having nausea too that Zofran does nothing for.  Taking Phenergan pretty consistently every 4 hours as well in spite of Reglan po scheduled.  Will continue to try and educate patient regarding these issues.

## 2011-11-29 NOTE — Progress Notes (Signed)
[name] [id number] 1990-08-23   Subjective:  Feeling better Nausea down Pain down Drain out   Objective:  Vital signs:  Filed Vitals:   11/28/11 0601 11/28/11 1412 11/28/11 2113 11/29/11 0533  BP: 120/91 124/83 126/80 108/74  Pulse: 119 120 109 121  Temp: 99.2 F (37.3 C) 98.7 F (37.1 C) 98.8 F (37.1 C) 98.1 F (36.7 C)  TempSrc: Oral Oral Oral Oral  Resp: 18 20 18 18   Height:      Weight:      SpO2: 100% 98% 100% 95%    Last BM Date: 11/27/11  Intake/Output   Yesterday:  08/30 0701 - 08/31 0700 In: 208 [I.V.:200; IV Piggyback:8] Out: 10 [Drains:10] This shift:  Total I/O In: 120 [P.O.:120] Out: 600 [Urine:600]  Bowel function:  Flatus: y  BM: n  Physical Exam:  General: Pt awake/alert/oriented x4 in no acute distress Eyes: PERRL, normal EOM.  Sclera clear.  No icterus Neuro: CN II-XII intact w/o focal sensory/motor deficits. Lymph: No head/neck/groin lymphadenopathy Psych:  No delerium/psychosis/paranoia.  More happy/less depressed HENT: Normocephalic, Mucus membranes moist.  No thrush Neck: Supple, No tracheal deviation Chest: No chest wall pain w good excursion CV:  Pulses intact.  Regular rhythm Abdomen: Soft/flat.  Nondistended.  Mildly tender at incisions only.  No incarcerated hernias. Ext:  SCDs BLE.  No mjr edema.  No cyanosis Skin: No petechiae / purpurae  Problem List:  Active Problems:  * No active hospital problems. *    Assessment  [name]  21 y.o. female  6 Days Post-Op  Procedure(s): LAPAROSCOPY DIAGNOSTIC  Improving  Plan:  -adv diet -wean IVF -VTE prophylaxis- SCDs, etc -mobilize as tolerated to help recovery -poss d/c in 1-2 days if improves  [name], M.D., F.A.C.S. Gastrointestinal and Minimally Invasive Surgery [facility] [address] [place], [place] [address] [phone] Main / Paging [phone] Voice Mail   11/29/2011  CARE  TEAM:  PCP: [name], NP  Outpatient Care Team: Patient Care Team: [name], NP as PCP - General (Family Medicine)  Inpatient Treatment Team: Treatment Team: Attending Provider: Md Edison Pace, MD; Registered Nurse: [facility], RN; Technician: Delila Spence, NT; Rounding Team: Md Edison Pace, MD; Registered Nurse: Lenis Noon, RN; Technician: Ottis Stain; Rounding Team: Fatima Blank, MD; Consulting Physician: Barton Dubois, MD; Registered Nurse: Wenda Low, RN; Dietitian: Christie Beckers, RD; Registered Nurse: Vaughan Basta, RN; Occupational Therapist: Malka So, OT; Physical Therapist: Harrel Lemon [place], PT   Results:   Labs: Results for orders placed during the hospital encounter of 11/22/11 (from the past 48 hour(s))  GLUCOSE, CAPILLARY     Status: Abnormal   Collection Time   11/27/11  4:18 PM      Component Value Range Comment   Glucose-Capillary 183 (*) 70 - 99 mg/dL    Comment 1 Notify RN     GLUCOSE, CAPILLARY     Status: Abnormal   Collection Time   11/27/11  8:05 PM      Component Value Range Comment   Glucose-Capillary 122 (*) 70 - 99 mg/dL    Comment 1 Documented in Chart      Comment 2 Notify RN       Imaging / Studies: No results found.  Medications / Allergies: per chart  Antibiotics: Anti-infectives     Start     Dose/Rate Route Frequency Ordered Stop   11/23/11 0800   piperacillin-tazobactam (  ZOSYN) IVPB 3.375 g  Status:  Discontinued        3.375 g 12.5 mL/hr over 240 Minutes Intravenous Every 8 hours 11/23/11 0341 11/24/11 1213   11/22/11 2230  piperacillin-tazobactam (ZOSYN) IVPB 3.375 g       3.375 g 100 mL/hr over 30 Minutes Intravenous  Once 11/22/11 2205 11/22/11 2326

## 2011-11-29 NOTE — Progress Notes (Signed)
Patient refused heparin sq shot

## 2011-11-29 NOTE — Progress Notes (Signed)
[facility] PROGRESS NOTE  [name] TOI:[id number] DOB: 1990-05-30 DOA: 11/22/2011 PCP: [name], NP  Assessment/Plan: 1. 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 Will continue SSI and lantus; last one increased to 12 units. No metformin. Patient interested on insulin pump at discharge. Will continue adjusting her insulin base on diet advancement and PO intake. CBG's 140-180's range. D51/2 NS to kvo now.  2. HTN: Continue vasotec; BP well controlled now.  3-Pneumoperitoneum and abd pain: per primary team; [facility].   4. DVT: Heparin   5. GERD: Continue protonix  6- Depression: Cotninue celexa.  7- Gastroparesis: will continue Reglan.  8-Tachycardia: secondary to pain. Will be ok to use PRN b-blocker, but avoid schedule doses due to risk of losing ability to recognize hypoglycemic symptoms.  Code Status: Full Family Communication: husband at bedside Disposition Plan: home when stable   Brief narrative: 21 year old insulin-dependent diabetic who one week ago underwent an apparently uneventful laparoscopic cholecystectomy with cholangiogram. She had presented to the hospital with recurrent and persistent abdominal pain; initial workup demonstrated pneumoperitoneum and patient was admitted by [facility] for further evaluation and treatment. Patient currently S/P exploratory laparoscopic w/o any abnormalities to explain pneumoperitoneum. Surgery has consult [place] for assistance controlling her HTN and also IDDM, especially now that patient is NPO. Denies CP, SO, N/V, Vision changes or fever. Patient reports still pain in her abdomen (epigastric area mainly).    Antibiotics:  none  HPI/Subjective: Afebrile; feeling better. Denies nausea and vomiting. No SOB or CP.    Objective: Filed Vitals:   11/28/11 1412 11/28/11 2113 11/29/11 0533 11/29/11 1307  BP: 124/83 126/80 108/74 119/83  Pulse: 120 109 121 118  Temp: 98.7 F (37.1 C) 98.8 F  (37.1 C) 98.1 F (36.7 C) 98.4 F (36.9 C)  TempSrc: Oral Oral Oral Oral  Resp: 20 18 18 18   Height:      Weight:      SpO2: 98% 100% 95% 98%    Intake/Output Summary (Last 24 hours) at 11/29/11 1341 Last data filed at 11/29/11 1123  Gross per 24 hour  Intake    328 ml  Output    600 ml  Net   -272 ml   Filed Weights   11/22/11 1912 11/23/11 0330 11/24/11 1612  Weight: 52.164 kg (115 lb) 60.782 kg (134 lb) 55.067 kg (121 lb 6.4 oz)    Exam:   General:  Flat affect, NAD; feeling nauseated. NGT removed  Cardiovascular: S1 and S2; no rubs or gallops  Respiratory: CTA  Abdomen: soft, no guarding; positive BS and no distension  Neuro: non focal  Data Reviewed: Basic Metabolic Panel:  Lab 83/38/25 0440 11/25/11 1005 11/24/11 0445 11/22/11 1950  NA 135 -- 134* 135  K 3.8 -- 3.6 3.6  CL 95* -- 99 94*  CO2 28 -- 27 28  GLUCOSE 140* -- 125* 284*  BUN 3* -- <3* 11  CREATININE 0.38* -- 0.40* 0.41*  CALCIUM 9.6 -- 8.9 9.7  MG -- 1.9 1.5 --  PHOS -- -- -- --   Liver Function Tests:  Lab 11/27/11 0440 11/24/11 0445 11/22/11 1950  AST 12 12 14   ALT 11 11 14   ALKPHOS 59 46 57  BILITOT 0.9 0.4 0.5  PROT 7.2 6.3 7.7  ALBUMIN 3.5 3.2* 4.1    Lab 11/24/11 0445 11/22/11 1950  LIPASE 27 31  AMYLASE -- --   CBC:  Lab 11/27/11 0440 11/24/11 0445 11/22/11 1950  WBC 3.4* 4.9 4.0  NEUTROABS -- -- --  HGB 12.5 11.2* 13.9  HCT 34.6* 31.2* 37.6  MCV 84.8 83.0 82.6  PLT 327 262 275   BNP (last 3 results)  Basename 11/14/11 1605  PROBNP 20.0   CBG:  Lab 11/29/11 1131 11/27/11 2005 11/27/11 1618 11/27/11 1139 11/27/11 0732  GLUCAP 151* 122* 183* 183* 179*      Studies: Dg Cholangiogram Operative  11/15/2011  *RADIOLOGY REPORT*  Clinical Data:   Evaluate for common bile duct stone. Cholecystectomy.  INTRAOPERATIVE CHOLANGIOGRAM  Technique:  Cholangiographic images from the C-arm fluoroscopic device were submitted for interpretation post-operatively.  Please see  the procedural report for the amount of contrast and the fluoroscopy time utilized.  Comparison:  Nuclear medicine study 11/14/2011.  Findings:  Normal caliber of the common duct, without persistent filling defect to suggest choledocholithiasis.  No contrast extravasation.  Free spill of contrast into the descending duodenum.  IMPRESSION: No evidence of choledocholithiasis.  Original Report Authenticated By: Areta Haber, M.D.   Ct Angio Chest W/cm &/or Wo Cm  11/07/2011  *RADIOLOGY REPORT*  Clinical Data: Tachycardia.  CT ANGIOGRAPHY CHEST  Technique:  Multidetector CT imaging of the chest using the standard protocol during bolus administration of intravenous contrast. Multiplanar reconstructed images including MIPs were obtained and reviewed to evaluate the vascular anatomy.  Contrast: 133m OMNIPAQUE IOHEXOL 350 MG/ML SOLN  Comparison: None.  Findings: Technically adequate study with good opacification of the central and segmental pulmonary arteries.  No focal filling defects demonstrated.  No evidence of significant pulmonary embolus.  Normal heart size.  Normal caliber thoracic aorta.  No significant lymphadenopathy in the chest.  The esophagus is decompressed. Visualized portions of the upper abdominal organs are grossly unremarkable.  No pleural effusions.  No focal airspace consolidation or interstitial changes in the lungs.  No pneumothorax.  There is a tiny bleb or cyst in the right apex.  Airways appear patent. Normal alignment of the thoracic vertebrae.  IMPRESSION: No evidence of significant pulmonary embolus.  Original Report Authenticated By: WNeale Burly M.D.   Nm Hepatobiliary  11/14/2011  *RADIOLOGY REPORT*  Clinical Data:  NUCLEAR MEDICINE HEPATOBILIARY IMAGING  Technique:  Sequential images of the abdomen were obtained out to 90 minutes following intravenous administration of radiopharmaceutical. Due to nonvisualization of the gallbladder, the patient was injected with an additional  1.0 mCi technetium 912mholetec and slow intravenous infusion of 2.2 mg morphine was administered, while sequential images were continued for another 30 minutes.  Radiopharmaceutical:  5.37m36mTc-65m27mletec  Comparison:  None.  Findings: There is prompt radiopharmaceutical uptake by the liver. Liver is normal appearance.  There is prompt biliary excretion activity, with activity initially seen within the duodenum at 15 minutes.  Reflux of biliary activity into the stomach is seen which showed progression over 90 minutes.  No gallbladder activity is identified either before or following intravenous infusion of morphine.  IMPRESSION:  1.  Nonvisualization of gallbladder, consistent with cystic duct obstruction. 2.  Prominent bile reflux into the stomach. 3.  No evidence of biliary obstruction.  Original Report Authenticated By: JOHNMarlaine HindD.   Us [place] Complete  11/06/2011  *RADIOLOGY REPORT*  Clinical Data:  Hypertension and diabetes.  Abdominal pain.  ABDOMINAL ULTRASOUND COMPLETE  Comparison:  None.  Findings:  Gallbladder:  No gallstones, gallbladder wall thickening, or pericholecystic fluid.  Negative sonographic Murphy's sign.  Common Bile Duct:  Within normal limits in caliber. Measures 4.8 mm.  Liver: No focal mass lesion identified.  Within normal limits in parenchymal echogenicity.  IVC:  Appears normal.  Pancreas:  No abnormality identified.  Spleen:  Within normal limits in size and echotexture.  Right kidney:  Normal in size and parenchymal echogenicity.  No evidence of mass or hydronephrosis.  Left kidney:  Normal in size and parenchymal echogenicity.  No evidence of mass or hydronephrosis.  Abdominal Aorta:  No aneurysm identified.  IMPRESSION: Negative abdominal ultrasound.  Original Report Authenticated By: Duayne Cal, M.D.   Ct Abdomen Pelvis W Contrast  11/22/2011  *RADIOLOGY REPORT*  Clinical Data: Mid abdominal pain  CT ABDOMEN AND PELVIS WITH CONTRAST  Technique:  Multidetector  CT imaging of the abdomen and pelvis was performed following the standard protocol during bolus administration of intravenous contrast.  Contrast: 125m OMNIPAQUE IOHEXOL 300 MG/ML  SOLN  Comparison: 11/22/2011 radiograph, 11/11/2011 CT  Findings: Limited images through the lung bases demonstrate no significant appreciable abnormality. The heart size is within normal limits. No pleural or pericardial effusion.  Significant amount of pneumoperitoneum.  Interval cholecystectomy. Small amount of fluid and air at the surgical bed.  No loculated fluid collection.  Mild intrahepatic biliary ductal prominence to the level of the ampulla.  Mild main pancreatic duct dilatation is similar to prior.  Unremarkable spleen, adrenal glands, and kidneys.  No hydronephrosis or hydroureter.  Contrast within the colon from the previous examination. No bowel obstruction.  Interloop gas along the left hemiabdomen.  Small amount of free fluid within the pelvis dependently measures slightly higher than simple fluid.  Distended, thin-walled bladder.  Unremarkable CT appearance to the uterus and adnexa.  Normal caliber aorta and branch vessels.  Postoperative changes periumbilical.  Subcutaneous fat stranding may be reactive or infectious. No associated fluid collection.  No acute osseous finding.  IMPRESSION: Extensive pneumoperitoneum.  The source of which is uncertain however bowel injury not excluded. The quantity is more than typically seen 1 week postoperative.  There is a small air and fluid collection in the surgical bed which is not necessarily unexpected in the recently postoperative state.  Small amount of free fluid within the pelvis dependently measures slightly higher than simple fluid.   Original Report Authenticated By: ASuanne Marker M.D.    Ct Abdomen Pelvis W Contrast  11/11/2011  *RADIOLOGY REPORT*  Clinical Data: Abdominal pain, nausea and vomiting.  CT ABDOMEN AND PELVIS WITH CONTRAST  Technique:  Multidetector  CT imaging of the abdomen and pelvis was performed following the standard protocol during bolus administration of intravenous contrast.  Contrast: 819mOMNIPAQUE IOHEXOL 300 MG/ML  SOLN  Comparison: 10/22/2011  Findings: The liver, gallbladder, kidneys, spleen and adrenal glands are unremarkable.  Ingested oral contrast is present in a distended stomach and in the duodenum.  Very little contrast is present in small bowel.  This may suggests some degree of underlying gastroparesis.  There is no evidence of small bowel dilatation or colonic abnormality.  No free intraperitoneal air.  The pancreas is atrophic.  No evidence of focal inflammatory process, fluid collection or abscess.  The bladder is unremarkable. No hernias are identified.  Edema in the subcutaneous fat noted previously is less prominent. No focal masses or enlarged lymph nodes are identified.  Bony structures are unremarkable.  IMPRESSION:  1.  Gastric distention with oral contrast.  This may suggest some degree of gastroparesis.  No masses are identified. 2.  Less prominent subcutaneous edema.  Original Report Authenticated By: GLAzzie RoupM.D.  Dg Chest Port 1 View  11/11/2011  *RADIOLOGY REPORT*  Clinical Data: Chest pain and shortness of breath.  PORTABLE CHEST - 1 VIEW  Comparison: CT chest 11/07/2011 and PA and lateral chest 09/18/2011.  Findings: Lungs are clear.  Heart size is normal.  No pneumothorax or pleural fluid.  IMPRESSION: Negative chest.  Original Report Authenticated By: Arvid Right. D'ALESSIO, M.D.   Dg Abd Acute W/chest  11/22/2011  *RADIOLOGY REPORT*  Clinical Data: 21 year old female with epigastric pain. Gallbladder surgery 7 days ago.  ACUTE ABDOMEN SERIES (ABDOMEN 2 VIEW & CHEST 1 VIEW)  Comparison: Intraoperative cholangiogram 11/15/2011 and earlier.  Findings: Good lung volumes, but moderate volume of pneumoperitoneum subjacent to the diaphragm.  No pneumothorax or evidence of pneumomediastinum.  Other mediastinal  contours are within normal limits.  Visualized tracheal air column is within normal limits.  The lungs are clear.  Oral contrast mixed with stool in the left colon. Nonobstructed bowel gas pattern.  Right upper quadrant surgical clips with evidence of gas in the gallbladder fossa or under the liver. No acute osseous abnormality identified.  IMPRESSION: 1.  Moderate volume of pneumoperitoneum, seems beyond that expected for 7 days postop from cholecystectomy. 2.  Nonobstructed bowel gas pattern, contrast mixed with stool in the left colon. 3. No acute cardiopulmonary abnormality.   Original Report Authenticated By: Randall An, M.D.     Scheduled Meds:    . acetaminophen  1,000 mg Oral TID  . aspirin  81 mg Oral Daily  . citalopram  20 mg Oral Daily  . enalapril  10 mg Oral QHS  . feeding supplement  1 Container Oral TID BM  . heparin  5,000 Units Subcutaneous Q8H  . insulin aspart  0-9 Units Subcutaneous Q4H  . insulin glargine  12 Units Subcutaneous QHS  . lip balm  1 application Topical BID  . metoCLOPramide  10 mg Oral QID  . multivitamin with minerals  1 tablet Oral Daily  . pantoprazole  40 mg Oral Q1200  . DISCONTD: aspirin  325 mg Oral Daily  . DISCONTD: insulin glargine  8 Units Subcutaneous QHS  . DISCONTD: metFORMIN  500 mg Oral BID WC  . DISCONTD: metoCLOPramide  5 mg Oral TID AC & HS  . DISCONTD: pantoprazole (PROTONIX) IV  40 mg Intravenous Q12H   Continuous Infusions:    . dextrose 5 % and 0.45 % NaCl with KCl 40 mEq/L 50 mL/hr at 11/29/11 0808    Time spent: > 20 minutes    Tyniah Kastens  [facility] Pager [phone]. If 8PM-8AM, please contact night-coverage at [email or web address], password [employer] 11/29/2011, 1:41 PM  LOS: 7 days

## 2011-11-29 NOTE — Progress Notes (Signed)
Patient was ambulating in hall with family . Tolerated well.

## 2011-11-30 LAB — GLUCOSE, CAPILLARY
Glucose-Capillary: 124 mg/dL — ABNORMAL HIGH (ref 70–99)
Glucose-Capillary: 144 mg/dL — ABNORMAL HIGH (ref 70–99)
Glucose-Capillary: 146 mg/dL — ABNORMAL HIGH (ref 70–99)
Glucose-Capillary: 168 mg/dL — ABNORMAL HIGH (ref 70–99)
Glucose-Capillary: 205 mg/dL — ABNORMAL HIGH (ref 70–99)

## 2011-11-30 MED ORDER — SODIUM CHLORIDE 0.9 % IV SOLN
INTRAVENOUS | Status: DC
  Administered 2011-11-30 – 2011-12-01 (×2): via INTRAVENOUS

## 2011-11-30 MED ORDER — INSULIN GLARGINE 100 UNIT/ML ~~LOC~~ SOLN
15.0000 [IU] | Freq: Every day | SUBCUTANEOUS | Status: DC
  Administered 2011-11-30 – 2011-12-05 (×6): 15 [IU] via SUBCUTANEOUS

## 2011-11-30 NOTE — Progress Notes (Addendum)
[facility] PROGRESS NOTE  [name] HEN:[id number] DOB: Aug 06, 1990 DOA: 11/22/2011 PCP: [name], NP  Assessment/Plan: 1. 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 Will continue SSI and lantus; last one increased to 15 units. No metformin. Patient interested on insulin pump at discharge; will need follow up with endocrinologist for that. D5 1/2 NS discontinue. Will need meal coverage with novolog at discharge 1-14 units base on how much she is eating and what is her preprandial CBG's (SSI)  2. HTN: Continue vasotec; BP well controlled now.  3-Pneumoperitoneum and abd pain: per primary team; CCS.   4. DVT: Heparin   5. GERD: Continue protonix  6- Depression: Cotninue celexa.  7- Gastroparesis: continue Reglan QID  8-Tachycardia: secondary to pain. Will be ok to use PRN b-blocker, but avoid schedule doses due to risk of losing ability to recognize hypoglycemic symptoms.   Will sign off since HTN and DM are well controlled at this point. Call with questions. Ok to discharge if stable from surgically stand point.   Code Status: Full Family Communication: husband at bedside Disposition Plan: home when stable   Brief narrative: 21 year old insulin-dependent diabetic who one week ago underwent an apparently uneventful laparoscopic cholecystectomy with cholangiogram. She had presented to the hospital with recurrent and persistent abdominal pain; initial workup demonstrated pneumoperitoneum and patient was admitted by [facility]CS for further evaluation and treatment. Patient currently S/P exploratory laparoscopic w/o any abnormalities to explain pneumoperitoneum. Surgery has consult [place] for assistance controlling her HTN and also IDDM, especially now that patient is NPO. Denies CP, SO, N/V, Vision changes or fever. Patient reports still pain in her abdomen (epigastric area mainly).    Antibiotics:  none  HPI/Subjective: Afebrile; feeling slightly better. Denies vomiting; still  with intermittent nausea. No SOB or CP.    Objective: Filed Vitals:   11/29/11 0533 11/29/11 1307 11/29/11 2220 11/30/11 0718  BP: 108/74 119/83 138/94 115/77  Pulse: 121 118 117 121  Temp: 98.1 F (36.7 C) 98.4 F (36.9 C) 98.3 F (36.8 C) 98.3 F (36.8 C)  TempSrc: Oral Oral Oral Oral  Resp: 18 18 20 20   Height:      Weight:      SpO2: 95% 98% 97% 98%   No intake or output data in the 24 hours ending 11/30/11 1149 Filed Weights   11/22/11 1912 11/23/11 0330 11/24/11 1612  Weight: 52.164 kg (115 lb) 60.782 kg (134 lb) 55.067 kg (121 lb 6.4 oz)    Exam:   General:  NAD; feeling nauseated; cooperative with exam  Cardiovascular: S1 and S2; no rubs or gallops  Respiratory: CTA  Abdomen: soft, no guarding; positive BS and no distension; mild diffuse discomfort described with deep palpation  Neuro: non focal  Data Reviewed: Basic Metabolic Panel:  Lab 36/14/43 0440 11/25/11 1005 11/24/11 0445  NA 135 -- 134*  K 3.8 -- 3.6  CL 95* -- 99  CO2 28 -- 27  GLUCOSE 140* -- 125*  BUN 3* -- <3*  CREATININE 0.38* -- 0.40*  CALCIUM 9.6 -- 8.9  MG -- 1.9 1.5  PHOS -- -- --   Liver Function Tests:  Lab 11/27/11 0440 11/24/11 0445  AST 12 12  ALT 11 11  ALKPHOS 59 46  BILITOT 0.9 0.4  PROT 7.2 6.3  ALBUMIN 3.5 3.2*    Lab 11/24/11 0445  LIPASE 27  AMYLASE --   CBC:  Lab 11/27/11 0440 11/24/11 0445  WBC 3.4* 4.9  NEUTROABS -- --  HGB  12.5 11.2*  HCT 34.6* 31.2*  MCV 84.8 83.0  PLT 327 262   BNP (last 3 results)  Basename 11/14/11 1605  PROBNP 20.0   CBG:  Lab 11/30/11 0817 11/30/11 0412 11/30/11 0002 11/29/11 2019 11/29/11 1641  GLUCAP 144* 124* 146* 189* 250*      Studies: Dg Cholangiogram Operative  11/15/2011  *RADIOLOGY REPORT*  Clinical Data:   Evaluate for common bile duct stone. Cholecystectomy.  INTRAOPERATIVE CHOLANGIOGRAM  Technique:  Cholangiographic images from the C-arm fluoroscopic device were submitted for interpretation  post-operatively.  Please see the procedural report for the amount of contrast and the fluoroscopy time utilized.  Comparison:  Nuclear medicine study 11/14/2011.  Findings:  Normal caliber of the common duct, without persistent filling defect to suggest choledocholithiasis.  No contrast extravasation.  Free spill of contrast into the descending duodenum.  IMPRESSION: No evidence of choledocholithiasis.  Original Report Authenticated By: Areta Haber, M.D.   Ct Angio Chest W/cm &/or Wo Cm  11/07/2011  *RADIOLOGY REPORT*  Clinical Data: Tachycardia.  CT ANGIOGRAPHY CHEST  Technique:  Multidetector CT imaging of the chest using the standard protocol during bolus administration of intravenous contrast. Multiplanar reconstructed images including MIPs were obtained and reviewed to evaluate the vascular anatomy.  Contrast: 151m OMNIPAQUE IOHEXOL 350 MG/ML SOLN  Comparison: None.  Findings: Technically adequate study with good opacification of the central and segmental pulmonary arteries.  No focal filling defects demonstrated.  No evidence of significant pulmonary embolus.  Normal heart size.  Normal caliber thoracic aorta.  No significant lymphadenopathy in the chest.  The esophagus is decompressed. Visualized portions of the upper abdominal organs are grossly unremarkable.  No pleural effusions.  No focal airspace consolidation or interstitial changes in the lungs.  No pneumothorax.  There is a tiny bleb or cyst in the right apex.  Airways appear patent. Normal alignment of the thoracic vertebrae.  IMPRESSION: No evidence of significant pulmonary embolus.  Original Report Authenticated By: WNeale Burly M.D.   Nm Hepatobiliary  11/14/2011  *RADIOLOGY REPORT*  Clinical Data:  NUCLEAR MEDICINE HEPATOBILIARY IMAGING  Technique:  Sequential images of the abdomen were obtained out to 90 minutes following intravenous administration of radiopharmaceutical. Due to nonvisualization of the gallbladder, the patient  was injected with an additional 1.0 mCi technetium 932mholetec and slow intravenous infusion of 2.2 mg morphine was administered, while sequential images were continued for another 30 minutes.  Radiopharmaceutical:  5.16m62mTc-65m62mletec  Comparison:  None.  Findings: There is prompt radiopharmaceutical uptake by the liver. Liver is normal appearance.  There is prompt biliary excretion activity, with activity initially seen within the duodenum at 15 minutes.  Reflux of biliary activity into the stomach is seen which showed progression over 90 minutes.  No gallbladder activity is identified either before or following intravenous infusion of morphine.  IMPRESSION:  1.  Nonvisualization of gallbladder, consistent with cystic duct obstruction. 2.  Prominent bile reflux into the stomach. 3.  No evidence of biliary obstruction.  Original Report Authenticated By: JOHNMarlaine HindD.   Us [place] Complete  11/06/2011  *RADIOLOGY REPORT*  Clinical Data:  Hypertension and diabetes.  Abdominal pain.  ABDOMINAL ULTRASOUND COMPLETE  Comparison:  None.  Findings:  Gallbladder:  No gallstones, gallbladder wall thickening, or pericholecystic fluid.  Negative sonographic Murphy's sign.  Common Bile Duct:  Within normal limits in caliber. Measures 4.8 mm.  Liver: No focal mass lesion identified.  Within normal limits in parenchymal echogenicity.  IVC:  Appears normal.  Pancreas:  No abnormality identified.  Spleen:  Within normal limits in size and echotexture.  Right kidney:  Normal in size and parenchymal echogenicity.  No evidence of mass or hydronephrosis.  Left kidney:  Normal in size and parenchymal echogenicity.  No evidence of mass or hydronephrosis.  Abdominal Aorta:  No aneurysm identified.  IMPRESSION: Negative abdominal ultrasound.  Original Report Authenticated By: Duayne Cal, M.D.   Ct Abdomen Pelvis W Contrast  11/22/2011  *RADIOLOGY REPORT*  Clinical Data: Mid abdominal pain  CT ABDOMEN AND PELVIS WITH  CONTRAST  Technique:  Multidetector CT imaging of the abdomen and pelvis was performed following the standard protocol during bolus administration of intravenous contrast.  Contrast: 180m OMNIPAQUE IOHEXOL 300 MG/ML  SOLN  Comparison: 11/22/2011 radiograph, 11/11/2011 CT  Findings: Limited images through the lung bases demonstrate no significant appreciable abnormality. The heart size is within normal limits. No pleural or pericardial effusion.  Significant amount of pneumoperitoneum.  Interval cholecystectomy. Small amount of fluid and air at the surgical bed.  No loculated fluid collection.  Mild intrahepatic biliary ductal prominence to the level of the ampulla.  Mild main pancreatic duct dilatation is similar to prior.  Unremarkable spleen, adrenal glands, and kidneys.  No hydronephrosis or hydroureter.  Contrast within the colon from the previous examination. No bowel obstruction.  Interloop gas along the left hemiabdomen.  Small amount of free fluid within the pelvis dependently measures slightly higher than simple fluid.  Distended, thin-walled bladder.  Unremarkable CT appearance to the uterus and adnexa.  Normal caliber aorta and branch vessels.  Postoperative changes periumbilical.  Subcutaneous fat stranding may be reactive or infectious. No associated fluid collection.  No acute osseous finding.  IMPRESSION: Extensive pneumoperitoneum.  The source of which is uncertain however bowel injury not excluded. The quantity is more than typically seen 1 week postoperative.  There is a small air and fluid collection in the surgical bed which is not necessarily unexpected in the recently postoperative state.  Small amount of free fluid within the pelvis dependently measures slightly higher than simple fluid.   Original Report Authenticated By: ASuanne Marker M.D.    Ct Abdomen Pelvis W Contrast  11/11/2011  *RADIOLOGY REPORT*  Clinical Data: Abdominal pain, nausea and vomiting.  CT ABDOMEN AND PELVIS WITH  CONTRAST  Technique:  Multidetector CT imaging of the abdomen and pelvis was performed following the standard protocol during bolus administration of intravenous contrast.  Contrast: 862mOMNIPAQUE IOHEXOL 300 MG/ML  SOLN  Comparison: 10/22/2011  Findings: The liver, gallbladder, kidneys, spleen and adrenal glands are unremarkable.  Ingested oral contrast is present in a distended stomach and in the duodenum.  Very little contrast is present in small bowel.  This may suggests some degree of underlying gastroparesis.  There is no evidence of small bowel dilatation or colonic abnormality.  No free intraperitoneal air.  The pancreas is atrophic.  No evidence of focal inflammatory process, fluid collection or abscess.  The bladder is unremarkable. No hernias are identified.  Edema in the subcutaneous fat noted previously is less prominent. No focal masses or enlarged lymph nodes are identified.  Bony structures are unremarkable.  IMPRESSION:  1.  Gastric distention with oral contrast.  This may suggest some degree of gastroparesis.  No masses are identified. 2.  Less prominent subcutaneous edema.  Original Report Authenticated By: GLAzzie RoupM.D.   Dg Chest Port 1 View  11/11/2011  *RADIOLOGY REPORT*  Clinical Data: Chest  pain and shortness of breath.  PORTABLE CHEST - 1 VIEW  Comparison: CT chest 11/07/2011 and PA and lateral chest 09/18/2011.  Findings: Lungs are clear.  Heart size is normal.  No pneumothorax or pleural fluid.  IMPRESSION: Negative chest.  Original Report Authenticated By: Arvid Right. D'ALESSIO, M.D.   Dg Abd Acute W/chest  11/22/2011  *RADIOLOGY REPORT*  Clinical Data: 21 year old female with epigastric pain. Gallbladder surgery 7 days ago.  ACUTE ABDOMEN SERIES (ABDOMEN 2 VIEW & CHEST 1 VIEW)  Comparison: Intraoperative cholangiogram 11/15/2011 and earlier.  Findings: Good lung volumes, but moderate volume of pneumoperitoneum subjacent to the diaphragm.  No pneumothorax or evidence of  pneumomediastinum.  Other mediastinal contours are within normal limits.  Visualized tracheal air column is within normal limits.  The lungs are clear.  Oral contrast mixed with stool in the left colon. Nonobstructed bowel gas pattern.  Right upper quadrant surgical clips with evidence of gas in the gallbladder fossa or under the liver. No acute osseous abnormality identified.  IMPRESSION: 1.  Moderate volume of pneumoperitoneum, seems beyond that expected for 7 days postop from cholecystectomy. 2.  Nonobstructed bowel gas pattern, contrast mixed with stool in the left colon. 3. No acute cardiopulmonary abnormality.   Original Report Authenticated By: Randall An, M.D.     Scheduled Meds:    . acetaminophen  1,000 mg Oral TID  . aspirin  81 mg Oral Daily  . citalopram  20 mg Oral Daily  . enalapril  10 mg Oral QHS  . feeding supplement  1 Container Oral TID BM  . heparin  5,000 Units Subcutaneous Q8H  . insulin aspart  0-9 Units Subcutaneous Q4H  . insulin glargine  15 Units Subcutaneous QHS  . lip balm  1 application Topical BID  . metoCLOPramide  10 mg Oral QID  . multivitamin with minerals  1 tablet Oral Daily  . pantoprazole  40 mg Oral Q1200  . DISCONTD: aspirin  325 mg Oral Daily  . DISCONTD: insulin glargine  12 Units Subcutaneous QHS  . DISCONTD: insulin glargine  8 Units Subcutaneous QHS  . DISCONTD: metFORMIN  500 mg Oral BID WC   Continuous Infusions:    . sodium chloride    . DISCONTD: dextrose 5 % and 0.45 % NaCl with KCl 40 mEq/L 20 mL/hr at 11/29/11 1510    Time spent: > 20 minutes    Anjeanette Petzold  [facility] Pager [phone]. If 8PM-8AM, please contact night-coverage at [email or web address], password [facility] 11/30/2011, 11:49 AM  LOS: 8 days

## 2011-11-30 NOTE — Progress Notes (Signed)
Patient ID: [name], female   DOB: Apr 16, 1990, 21 y.o.   MRN: [id number]  [facility] - Progress Note  POD# 7   Subjective: Patient complains of mild nausea after meals.  No emesis.  Tolerating regular diet.  Pain improved.  Objective: Vital signs in last 24 hours: Temp:  [98.3 F (36.8 C)-98.4 F (36.9 C)] 98.3 F (36.8 C) (09/01 0718) Pulse Rate:  [117-121] 121  (09/01 0718) Resp:  [18-20] 20  (09/01 0718) BP: (115-138)/(77-94) 115/77 mmHg (09/01 0718) SpO2:  [97 %-98 %] 98 % (09/01 0718) Last BM Date: 11/29/11  Intake/Output from previous day: 08/31 0701 - 09/01 0700 In: 120 [P.O.:120] Out: 600 [Urine:600]  Exam: HEENT - clear, not icteric Neck - soft Chest - clear bilaterally Cor - RRR, no murmur Abd - soft, scaphoid; minimal tenderness; no mass; BS present; incisions clear and dry Ext - no significant edema Neuro - grossly intact, no focal deficits  Lab Results:  No results found for this basename: WBC:2,HGB:2,HCT:2,PLT:2 in the last 72 hours  No results found for this basename: NA:2,K:2,CL:2,CO2:2,GLUCOSE:2,BUN:2,CREATININE:2,CALCIUM:2 in the last 72 hours  Studies/Results: No results found.  Assessment / Plan: 1.  Status post lap chole, diagnostic laparoscopy - POD#7 from second procedure  - tolerating diet  - exam benign Patient can be discharged from surgical standpoint.  Medical service continues to address several issues.  When stable and cleared from medical standpoint, will discharge to home.  Earnstine Regal, MD, [facility] Office: [phone]  11/30/2011

## 2011-11-30 NOTE — Progress Notes (Signed)
OT Cancellation Note  Treatment cancelled today due to patient's refusal to participate. Pt stated she was feeling nauseous.  Will recheck on pt as schedule allows. [name] 11/30/2011, 10:19 AM

## 2011-11-30 NOTE — Plan of Care (Signed)
Problem: Phase III Progression Outcomes Goal: Pain controlled on oral analgesia Outcome: Not Progressing Still requiring IV Dilaudid every 4 hours, says po OxyIr does little, refusing Tylenol

## 2011-12-01 LAB — GLUCOSE, CAPILLARY
Glucose-Capillary: 108 mg/dL — ABNORMAL HIGH (ref 70–99)
Glucose-Capillary: 127 mg/dL — ABNORMAL HIGH (ref 70–99)
Glucose-Capillary: 168 mg/dL — ABNORMAL HIGH (ref 70–99)
Glucose-Capillary: 169 mg/dL — ABNORMAL HIGH (ref 70–99)
Glucose-Capillary: 173 mg/dL — ABNORMAL HIGH (ref 70–99)
Glucose-Capillary: 182 mg/dL — ABNORMAL HIGH (ref 70–99)
Glucose-Capillary: 185 mg/dL — ABNORMAL HIGH (ref 70–99)
Glucose-Capillary: 187 mg/dL — ABNORMAL HIGH (ref 70–99)

## 2011-12-01 MED ORDER — HYDROCODONE-ACETAMINOPHEN 5-325 MG PO TABS
1.0000 | ORAL_TABLET | ORAL | Status: DC | PRN
  Administered 2011-12-01 – 2011-12-02 (×3): 2 via ORAL
  Filled 2011-12-01 (×3): qty 2
  Filled 2011-12-01: qty 1

## 2011-12-01 NOTE — Progress Notes (Signed)
Patient ID: [name], female   DOB: 1990-04-12, 21 y.o.   MRN: [id number]  [facility] - Progress Note  POD# 8  Subjective: Patient persists with complaints of nausea and epigastric pain.  Medical service has signed off and states that HTN and DM are controlled on present regimen.  No labs today.  Objective: Vital signs in last 24 hours: Temp:  [98.5 F (36.9 C)-99.2 F (37.3 C)] 98.6 F (37 C) (09/02 0650) Pulse Rate:  [107-122] 107  (09/02 0650) Resp:  [18] 18  (09/02 0650) BP: (107-131)/(74-92) 131/92 mmHg (09/02 0650) SpO2:  [96 %] 96 % (09/02 0650) Weight:  [120 lb 3.2 oz (54.522 kg)] 120 lb 3.2 oz (54.522 kg) (09/02 0700) Last BM Date: 11/29/11  Intake/Output from previous day:    Exam: HEENT - clear, not icteric Neck - soft Chest - clear bilaterally Cor - RRR, no murmur Abd - soft, scaphoid, no mass; BS present; wound clear and dry Ext - no significant edema Neuro - grossly intact, no focal deficits  Lab Results:  No results found for this basename: WBC:2,HGB:2,HCT:2,PLT:2 in the last 72 hours  No results found for this basename: NA:2,K:2,CL:2,CO2:2,GLUCOSE:2,BUN:2,CREATININE:2,CALCIUM:2 in the last 72 hours  Studies/Results: No results found.  Assessment / Plan: 1.  Status post lap chole, diagnostic laparoscopy  - persistent pain - may need evaluation by GI (Dr. Collene Mares)  - discontinue IV narcotics  - allow po narcotics  - encouraged po fluid intake and ambulation today.  - will check labs in AM 9/3  (last lab 8/29)  Earnstine Regal, MD, [facility] Office: [phone]  12/01/2011

## 2011-12-01 NOTE — Progress Notes (Signed)
PT Cancellation Note  ___Treatment cancelled today due to medical issues with patient which prohibited   therapy  ___ Treatment cancelled today due to patient receiving procedure or test   ___ Treatment cancelled today due to patient's refusal to participate   _x_ Treatment cancelled today due to C/o nausea, states she has been up to BR and walks w/ family.    Clydie Braun HillPT [phone]

## 2011-12-02 LAB — COMPREHENSIVE METABOLIC PANEL
ALT: 7 U/L (ref 0–35)
AST: 10 U/L (ref 0–37)
Albumin: 3.2 g/dL — ABNORMAL LOW (ref 3.5–5.2)
Alkaline Phosphatase: 52 U/L (ref 39–117)
BUN: 11 mg/dL (ref 6–23)
CO2: 32 mEq/L (ref 19–32)
Calcium: 9.3 mg/dL (ref 8.4–10.5)
Chloride: 101 mEq/L (ref 96–112)
Creatinine, Ser: 0.48 mg/dL — ABNORMAL LOW (ref 0.50–1.10)
GFR calc Af Amer: >90 mL/min (ref >90)
GFR calc non Af Amer: >90 mL/min (ref >90)
Glucose, Bld: 114 mg/dL — ABNORMAL HIGH (ref 70–99)
Potassium: 3.9 mEq/L (ref 3.5–5.1)
Sodium: 139 mEq/L (ref 135–145)
Total Bilirubin: 0.5 mg/dL (ref 0.3–1.2)
Total Protein: 6.5 g/dL (ref 6.0–8.3)

## 2011-12-02 LAB — GLUCOSE, CAPILLARY
Glucose-Capillary: 112 mg/dL — ABNORMAL HIGH (ref 70–99)
Glucose-Capillary: 123 mg/dL — ABNORMAL HIGH (ref 70–99)
Glucose-Capillary: 131 mg/dL — ABNORMAL HIGH (ref 70–99)
Glucose-Capillary: 172 mg/dL — ABNORMAL HIGH (ref 70–99)
Glucose-Capillary: 237 mg/dL — ABNORMAL HIGH (ref 70–99)

## 2011-12-02 LAB — CBC
HCT: 31.4 % — ABNORMAL LOW (ref 36.0–46.0)
Hemoglobin: 11 g/dL — ABNORMAL LOW (ref 12.0–15.0)
MCH: 30.7 pg (ref 26.0–34.0)
MCHC: 35 g/dL (ref 30.0–36.0)
MCV: 87.7 fL (ref 78.0–100.0)
Platelets: 344 10*3/uL (ref 150–400)
RBC: 3.58 MIL/uL — ABNORMAL LOW (ref 3.87–5.11)
RDW: 12.5 % (ref 11.5–15.5)
WBC: 3.7 10*3/uL — ABNORMAL LOW (ref 4.0–10.5)

## 2011-12-02 LAB — HEMOGLOBIN A1C
Hgb A1c MFr Bld: 6.2 % — ABNORMAL HIGH (ref <5.7)
Mean Plasma Glucose: 131 mg/dL — ABNORMAL HIGH (ref <117)

## 2011-12-02 MED ORDER — MORPHINE SULFATE 2 MG/ML IJ SOLN
2.0000 mg | Freq: Once | INTRAMUSCULAR | Status: AC
  Administered 2011-12-02: 2 mg via INTRAVENOUS
  Filled 2011-12-02: qty 1

## 2011-12-02 MED ORDER — SODIUM CHLORIDE 0.9 % IV SOLN
INTRAVENOUS | Status: DC
  Administered 2011-12-02: 20 mL/h via INTRAVENOUS

## 2011-12-02 MED ORDER — POLYETHYLENE GLYCOL 3350 17 G PO PACK
17.0000 g | PACK | Freq: Every day | ORAL | Status: DC
  Administered 2011-12-02 – 2011-12-06 (×3): 17 g via ORAL
  Filled 2011-12-02 (×5): qty 1

## 2011-12-02 NOTE — Progress Notes (Signed)
Occupational Therapy Treatment Patient Details Name: [name] MRN: [id number] DOB: 1990-07-06 Today's Date: 12/02/2011 Time: 4696-2952 OT Time Calculation (min): 11 min  OT Assessment / Plan / Recommendation Comments on Treatment Session Pt with pain but did well with activities performed. Encouraged pt to get up with family to increase strength, activity tolerance.     Follow Up Recommendations  No OT follow up;Supervision - Intermittent    Barriers to Discharge       Equipment Recommendations  None recommended by OT    Recommendations for Other Services    Frequency Min 2X/week   Plan Discharge plan remains appropriate    Precautions / Restrictions Precautions Precautions: Fall        ADL  Grooming: Performed;Wash/dry face;Supervision/safety Where Assessed - Grooming: Unsupported standing Lower Body Bathing: Simulated;Supervision/safety Where Assessed - Lower Body Bathing: Unsupported standing (simulate standing in tub/shower) Lower Body Dressing: Set up;Performed;Other (comment) (don socks) Where Assessed - Lower Body Dressing: Unsupported sitting Toilet Transfer: Performed;Supervision/safety Toilet Transfer Method: Other (comment) (ambulating no device) Toilet Transfer Equipment: Comfort height toilet;Other (comment) (held to edge of commode) Toileting - Architect and Hygiene: Performed;Supervision/safety Where Assessed - Engineer, mining and Hygiene: Sit to stand from 3-in-1 or toilet Tub/Shower Transfer: Simulated;Supervision/safety;Other (comment) (simulate step over bathtub) ADL Comments: Pt states 6/10 throughout session. Pt's husband present and states he will be with pt at d/c. Advised pt to have closer supervision with shower at discharge for safety as she reports feeling fatigued with activity. Pt verbalized understanding.    OT Diagnosis:    OT Problem List:   OT Treatment Interventions:     OT Goals ADL Goals ADL Goal:  Grooming - Progress: Progressing toward goals ADL Goal: Lower Body Bathing - Progress: Progressing toward goals ADL Goal: Lower Body Dressing - Progress: Progressing toward goals ADL Goal: Toilet Transfer - Progress: Progressing toward goals ADL Goal: Tub/Shower Transfer - Progress: Met  Visit Information  Last OT Received On: 12/02/11 Assistance Needed: +1    Subjective Data  Subjective: I am still sleepy Patient Stated Goal: none stated. agreeable to work with OT   Prior Functioning       Cognition  Overall Cognitive Status: Appears within functional limits for tasks assessed/performed Arousal/Alertness: Awake/alert Orientation Level: Appears intact for tasks assessed Behavior During Session: [facility] for tasks performed    Mobility  Shoulder Instructions Bed Mobility Bed Mobility: Supine to Sit Supine to Sit: 6: Modified independent (Device/Increase time);HOB elevated Sitting - Scoot to Edge of Bed: 7: Independent Sit to Supine: HOB elevated;6: Modified independent (Device/Increase time) Transfers Transfers: Sit to Stand;Stand to Sit Sit to Stand: 5: Supervision;From bed;From toilet Stand to Sit: 5: Supervision;To bed;To toilet       Exercises      Balance     End of Session OT - End of Session Activity Tolerance: Patient limited by pain Patient left: in bed;with call bell/phone within reach;with family/visitor present  GO     Lennox Laity [phone] 12/02/2011, 10:08 AM

## 2011-12-02 NOTE — Progress Notes (Signed)
C/o ongoing epigastric pain, occurs more so in the am. Feels like "hunger pains but a 100x worse". Radiates to back.  Some N. No vomiting. +flatus. Last BM. No bloating  Soft, nt, nd. Flat. Incision c/d/i cta Reg  Cont epigastric pain of unclear etiology. Labs ok Will ask GI to consult.  [name]. Redmond Pulling, MD, FACS General, Bariatric, & Minimally Invasive Surgery [facility], [place]

## 2011-12-02 NOTE — Progress Notes (Signed)
9 Days Post-Op  Subjective: Complains of ongoing epigastric pain just like what brought her to the ER.  Nausea with PO's, no vomiting. She is on tylenol, celexa, PPI, IV pain meds stopped.she is also on reglan. Walked 2-3 times yesterday.   Objective: Vital signs in last 24 hours: Temp:  [98.2 F (36.8 C)-99.4 F (37.4 C)] 98.2 F (36.8 C) (09/03 0530) Pulse Rate:  [115-139] 139  (09/03 0535) Resp:  [18-20] 20  (09/03 0530) BP: (110-135)/(46-97) 123/90 mmHg (09/03 0535) SpO2:  [98 %-100 %] 100 % (09/03 0530) Last BM Date: 11/29/11  240 ml PO recorded, Diet: carb modified, tm 99.4, labs today are normal.  Intake/Output from previous day: 09/02 0701 - 09/03 0700 In: 710.9 [P.O.:240; I.V.:470.9] Out: 800 [Urine:800] Intake/Output this shift:    General appearance: alert, cooperative and no distress GI: soft, non-tender; bowel sounds normal; no masses,  no organomegaly and incisions look fine, pain is mid epigastric area just below xyphoid.  Lab Results:   Basename 12/02/11 0430  WBC 3.7*  HGB 11.0*  HCT 31.4*  PLT 344    BMET  Basename 12/02/11 0430  NA 139  K 3.9  CL 101  CO2 32  GLUCOSE 114*  BUN 11  CREATININE 0.48*  CALCIUM 9.3   PT/INR No results found for this basename: LABPROT:2,INR:2 in the last 72 hours   Lab 12/02/11 0430 11/27/11 0440  AST 10 12  ALT 7 11  ALKPHOS 52 59  BILITOT 0.5 0.9  PROT 6.5 7.2  ALBUMIN 3.2* 3.5     Lipase     Component Value Date/Time   LIPASE 27 11/24/2011 0445     Studies/Results: No results found.  Medications:    . acetaminophen  1,000 mg Oral TID  . aspirin  81 mg Oral Daily  . citalopram  20 mg Oral Daily  . enalapril  10 mg Oral QHS  . feeding supplement  1 Container Oral TID BM  . heparin  5,000 Units Subcutaneous Q8H  . insulin aspart  0-9 Units Subcutaneous Q4H  . insulin glargine  15 Units Subcutaneous QHS  . lip balm  1 application Topical BID  . metoCLOPramide  10 mg Oral QID  .  multivitamin with minerals  1 tablet Oral Daily  . pantoprazole  40 mg Oral Q1200    Assessment/Plan Free air, abdominal pain post lap cholecystectomy 11/15/11 Dr.Gross; s/p diagnostic laparoscopy,11/23/11, with nothing abnormal found as source of air.  Diabetes mellitus type 1 since age 73. HBA1C 12.9  Hypertension, currently with poor control  Hx of recurrent DKA Last hospitalization 09/17/11.    Post op ileus,  Still needing pain meds   Plan:  As Dr Collene Mares to see today.   Add Miralax.     LOS: 10 days    [name] 12/02/2011

## 2011-12-02 NOTE — Consult Note (Signed)
Reason for Consult:Epigastric pain, nausea/vomiting Referring Physician: CCS  Glyn Ade Wegmann HPI: This is a 21 year old female who is admitted for persistent epigastric abdominal pain.  She reports that her pain started in July acutely and she denied having this pain in the past.  The patient was evaluated by Dr. Collene Mares in the office and a work up was performed with a HIDA and ultrasound.  The work up was negative, but with the severity of her symptoms she ultimately underwent a lap chole.  Her pain improved for several days and then it recurred.  She represented to the hospital for further evaluation when her pain was severe again.  A CT scan revealed free air and a diagnostic laproscopy was performed and there was no evidence of any spontaneous perforation or surgical injury.  The patient reports that he pain is like hunger pains, but very severe.  On a whole it is constant, but there are times that it remits.  PO intake does not improve or worsen the pain and there is no report of odynophagia or dysphagia.  Her nausea and vomiting are intermittent.  Currently she does not have any pain.  No prior gastric emptying scan with her long history of IDDM.  Past Medical History  Diagnosis Date  . Diabetes mellitus   . Hypertension   . Diabetes type 1, uncontrolled 11/14/2011    Since age 56    Past Surgical History  Procedure Date  . Ankle surgery   . Cholecystectomy 11/15/2011    Procedure: LAPAROSCOPIC CHOLECYSTECTOMY WITH INTRAOPERATIVE CHOLANGIOGRAM;  Surgeon: [name], MD;  Location: [facility];  Service: General;  Laterality: N/A;  . Laparoscopy 11/23/2011    Procedure: LAPAROSCOPY DIAGNOSTIC;  Surgeon: Edward Jolly, MD;  Location: [facility];  Service: General;  Laterality: N/A;    History reviewed. No pertinent family history.  Social History:  reports that she has never smoked. She has never used smokeless tobacco. She reports that she does not drink alcohol or use illicit  drugs.  Allergies: No Known Allergies  Medications:  Scheduled:   . acetaminophen  1,000 mg Oral TID  . aspirin  81 mg Oral Daily  . citalopram  20 mg Oral Daily  . enalapril  10 mg Oral QHS  . feeding supplement  1 Container Oral TID BM  . heparin  5,000 Units Subcutaneous Q8H  . insulin aspart  0-9 Units Subcutaneous Q4H  . insulin glargine  15 Units Subcutaneous QHS  . lip balm  1 application Topical BID  . metoCLOPramide  10 mg Oral QID  . multivitamin with minerals  1 tablet Oral Daily  . pantoprazole  40 mg Oral Q1200  . polyethylene glycol  17 g Oral Daily   Continuous:   . sodium chloride 50 mL/hr at 12/01/11 2256    Results for orders placed during the hospital encounter of 11/22/11 (from the past 24 hour(s))  GLUCOSE, CAPILLARY     Status: Abnormal   Collection Time   12/01/11  4:16 PM      Component Value Range   Glucose-Capillary 169 (*) 70 - 99 mg/dL   Comment 1 Notify RN    GLUCOSE, CAPILLARY     Status: Abnormal   Collection Time   12/01/11  7:49 PM      Component Value Range   Glucose-Capillary 127 (*) 70 - 99 mg/dL   Comment 1 Documented in Chart     Comment 2 Notify RN  GLUCOSE, CAPILLARY     Status: Abnormal   Collection Time   12/01/11 11:39 PM      Component Value Range   Glucose-Capillary 173 (*) 70 - 99 mg/dL  GLUCOSE, CAPILLARY     Status: Abnormal   Collection Time   12/02/11  4:27 AM      Component Value Range   Glucose-Capillary 112 (*) 70 - 99 mg/dL  CBC     Status: Abnormal   Collection Time   12/02/11  4:30 AM      Component Value Range   WBC 3.7 (*) 4.0 - 10.5 K/uL   RBC 3.58 (*) 3.87 - 5.11 MIL/uL   Hemoglobin 11.0 (*) 12.0 - 15.0 g/dL   HCT 31.4 (*) 36.0 - 46.0 %   MCV 87.7  78.0 - 100.0 fL   MCH 30.7  26.0 - 34.0 pg   MCHC 35.0  30.0 - 36.0 g/dL   RDW 12.5  11.5 - 15.5 %   Platelets 344  150 - 400 K/uL  COMPREHENSIVE METABOLIC PANEL     Status: Abnormal   Collection Time   12/02/11  4:30 AM      Component Value Range   Sodium  139  135 - 145 mEq/L   Potassium 3.9  3.5 - 5.1 mEq/L   Chloride 101  96 - 112 mEq/L   CO2 32  19 - 32 mEq/L   Glucose, Bld 114 (*) 70 - 99 mg/dL   BUN 11  6 - 23 mg/dL   Creatinine, Ser 0.48 (*) 0.50 - 1.10 mg/dL   Calcium 9.3  8.4 - 10.5 mg/dL   Total Protein 6.5  6.0 - 8.3 g/dL   Albumin 3.2 (*) 3.5 - 5.2 g/dL   AST 10  0 - 37 U/L   ALT 7  0 - 35 U/L   Alkaline Phosphatase 52  39 - 117 U/L   Total Bilirubin 0.5  0.3 - 1.2 mg/dL   GFR calc non Af Amer >90  >90 mL/min   GFR calc Af Amer >90  >90 mL/min  GLUCOSE, CAPILLARY     Status: Abnormal   Collection Time   12/02/11  7:21 AM      Component Value Range   Glucose-Capillary 123 (*) 70 - 99 mg/dL  GLUCOSE, CAPILLARY     Status: Abnormal   Collection Time   12/02/11 11:43 AM      Component Value Range   Glucose-Capillary 131 (*) 70 - 99 mg/dL   Comment 1 Notify RN       No results found.  ROS:  As stated above in the HPI otherwise negative.  Blood pressure 123/90, pulse 139, temperature 98.2 F (36.8 C), temperature source Oral, resp. rate 20, height 5' 7"  (1.702 m), weight 54.522 kg (120 lb 3.2 oz), last menstrual period 10/16/2011, SpO2 100.00%.    PE: Gen: NAD, Alert and Oriented HEENT:  [place]/AT, EOMI Neck: Supple, no LAD Lungs: CTA Bilaterally CV: RRR without M/G/R ABM: Soft, minimal epigastric tenderness, +BS Ext: No C/C/E  Assessment/Plan: 1) Epigastric pain. 2) IDDM.   The etiology of her pain is not clear to me.  It does not appear to be consistent with overt reflux, but I think an EGD will be the next step.  I will perform the procedure tomorrow to see if any obvious pathology can be identified.  No prior gastric emptying scan and gastroparesis can result in a significant amount of pain.  It will most likely  be beneficial to perform a formal study.  Depression is a significant consideration, but I was not able to explore this issue as her husband was present.    Plan: 1) EGD tomorrow and further recommendations  pending the findings.  Riordan Walle D 12/02/2011, 1:16 PM

## 2011-12-02 NOTE — Progress Notes (Signed)
Physical Therapy Treatment and D/C from PT Patient Details Name: [name] MRN: [id number] DOB: [birth year] Today's Date: 12/02/2011 Time: 4098-1191 PT Time Calculation (min): 8 min  PT Assessment / Plan / Recommendation Comments on Treatment Session  Spouse reports pt has been up ambulating with him.  Pt and spouse agree for no further need for therapy since pt is up around room with spouse supervision and ambulating with spouse.  Pt and spouse agreed to have person present for performing stairs upon d/c home.  Pt mobility seems to be improving will likely return to baseline upon decrease in pt's pain. RN notified to continue to encourage pt ambulation.    Follow Up Recommendations  No PT follow up    Barriers to Discharge        Equipment Recommendations  None recommended by PT    Recommendations for Other Services    Frequency     Plan All goals met and education completed, patient dischaged from PT services    Precautions / Restrictions     Pertinent Vitals/Pain 5/10 abdomen, RN notified    Mobility  Bed Mobility Details for Bed Mobility Assistance: pt up unplugging IV pole upon entering Transfers Transfers: Stand to Sit;Sit to Stand Sit to Stand: 5: Supervision Stand to Sit: 5: Supervision Details for Transfer Assistance: increased time Ambulation/Gait Ambulation/Gait Assistance: 5: Supervision Ambulation Distance (Feet): 250 Feet Assistive device: None Ambulation/Gait Assistance Details: pt pushed IV pole, no unsteadiness observed, seemed to be doing better with trunk extension today Gait Pattern: Step-through pattern Gait velocity: slow cautious Stairs: Yes Stairs Assistance: 4: Min assist Stairs Assistance Details (indicate cue type and reason): assist to steady when stair alarm for 5th floor activated otherwise min/guard, pt reports unsteadiness from surprise of alarm, pt and spouse agreed to have have person present with stairs upon return home Stair  Management Technique: One rail Right Number of Stairs: 3     Exercises     PT Diagnosis:    PT Problem List:   PT Treatment Interventions:     PT Goals  Goals set at modified independent level and pt currently min/guard to supervision however performing mobility with spouse around room and ambulating in hallway.  Pt to have spouse assist upon d/c home.  Pt and spouse agreed for no further therapy needs at this time.    Visit Information  Last PT Received On: 12/02/11 Assistance Needed: +1    Subjective Data  Subjective: pt agreeable to ambulate and try a few steps   Cognition  Overall Cognitive Status: Appears within functional limits for tasks assessed/performed    Balance     End of Session PT - End of Session Activity Tolerance: Patient tolerated treatment well Patient left: in bed;with call bell/phone within reach;with family/visitor present   GP     Davionte Lusby,[name] 12/02/2011, 2:47 PM Pager: [phone]

## 2011-12-03 ENCOUNTER — Encounter (HOSPITAL_COMMUNITY): Payer: Self-pay | Admitting: Gastroenterology

## 2011-12-03 ENCOUNTER — Encounter (HOSPITAL_COMMUNITY): Admission: EM | Disposition: A | Discharge status: Home / Self Care | Payer: Self-pay | Source: Home / Self Care

## 2011-12-03 HISTORY — PX: ESOPHAGOGASTRODUODENOSCOPY: SHX5428

## 2011-12-03 LAB — GLUCOSE, CAPILLARY
Glucose-Capillary: 58 mg/dL — ABNORMAL LOW (ref 70–99)
Glucose-Capillary: 103 mg/dL — ABNORMAL HIGH (ref 70–99)
Glucose-Capillary: 110 mg/dL — ABNORMAL HIGH (ref 70–99)
Glucose-Capillary: 127 mg/dL — ABNORMAL HIGH (ref 70–99)
Glucose-Capillary: 140 mg/dL — ABNORMAL HIGH (ref 70–99)
Glucose-Capillary: 154 mg/dL — ABNORMAL HIGH (ref 70–99)
Glucose-Capillary: 166 mg/dL — ABNORMAL HIGH (ref 70–99)
Glucose-Capillary: 346 mg/dL — ABNORMAL HIGH (ref 70–99)
Glucose-Capillary: 95 mg/dL (ref 70–99)
Glucose-Capillary: 99 mg/dL (ref 70–99)
Glucose-Capillary: 176 mg/dL — ABNORMAL HIGH (ref 70–99)
Glucose-Capillary: 202 mg/dL — ABNORMAL HIGH (ref 70–99)
Glucose-Capillary: 74 mg/dL (ref 70–99)
Glucose-Capillary: 169 mg/dL — ABNORMAL HIGH (ref 70–99)
Glucose-Capillary: 147 mg/dL — ABNORMAL HIGH (ref 70–99)

## 2011-12-03 SURGERY — EGD (ESOPHAGOGASTRODUODENOSCOPY)
Anesthesia: Moderate Sedation

## 2011-12-03 MED ORDER — BUTAMBEN-TETRACAINE-BENZOCAINE 2-2-14 % EX AERO
INHALATION_SPRAY | CUTANEOUS | Status: DC | PRN
  Administered 2011-12-03: 2 via TOPICAL

## 2011-12-03 MED ORDER — FENTANYL CITRATE 0.05 MG/ML IJ SOLN
INTRAMUSCULAR | Status: DC | PRN
  Administered 2011-12-03 (×2): 25 ug via INTRAVENOUS

## 2011-12-03 MED ORDER — MIDAZOLAM HCL 10 MG/2ML IJ SOLN
INTRAMUSCULAR | Status: DC | PRN
  Administered 2011-12-03 (×2): 2 mg via INTRAVENOUS

## 2011-12-03 NOTE — H&P (View-Only) (Signed)
Pt taken down to endo for EGD. 

## 2011-12-03 NOTE — Interval H&P Note (Signed)
History and Physical Interval Note:  05/09/209 15:52 PM  [name]  has presented today for surgery, with the diagnosis of Epigastric pain/Nausea/Vomiting  The various methods of treatment have been discussed with the patient and family. After consideration of risks, benefits and other options for treatment, the patient has consented to  Procedure(s) (LRB) with comments: ESOPHAGOGASTRODUODENOSCOPY (EGD) (N/A) as a surgical intervention .  The patient's history has been reviewed, patient examined, no change in status, stable for surgery.  I have reviewed the patient's chart and labs.  Questions were answered to the patient's satisfaction.     Avina Eberle D

## 2011-12-03 NOTE — Op Note (Signed)
[facility] [place], [address]   OPERATIVE PROCEDURE REPORT  PATIENT: [name], [name]  MR#: [id number] BIRTHDATE: 30-Sep-1990  GENDER: Female ENDOSCOPIST: Carol Ada, MD ASSISTANT:   Wray Kearns, RN and Remigio Eisenmenger, Technician PROCEDURE DATE: 12/03/2011 PROCEDURE:   EGD, diagnostic ASA CLASS:   Class II INDICATIONS:nausea and epigastric pain. MEDICATIONS: Versed 4 mg IV and Fentanyl 50 mcg IV TOPICAL ANESTHETIC:   Cetacaine Spray  DESCRIPTION OF PROCEDURE:   After the risks benefits and alternatives of the procedure were thoroughly explained, informed consent was obtained.  The Pentax Gastroscope [id number]  endoscope was introduced through the mouth  and advanced to the stomach fundus Without limitations.      The instrument was slowly withdrawn as the mucosa was fully examined.      STOMACH: The gastric lumen was filled with retained gastric contents.  This is consistent with gastroparesis.  Because of the amount of retained contents, a complete evaluation was not possible or safe.   The gastric lumen was filled with retained gastric contents.  This is consistent with gastroparesis.  Because of the amount of retained contents, a complete evaluation was not possible or safe.          The scope was then withdrawn from the patient and the procedure terminated.  COMPLICATIONS: There were no complications. IMPRESSION:1.   The gastric lumen was filled with retained gastric contents.  This is consistent with gastroparesis.  Because of the amount of retained contents, a complete evaluation was not possible or safe.  RECOMMENDATIONS: 1) Gastric emptying scan.  2) Maintain NPO. .   _______________________________ Lorrin MaisCarol Ada, MD 12/03/2011 1:32 PM

## 2011-12-03 NOTE — Progress Notes (Signed)
Pt taken down to endo for EGD.

## 2011-12-03 NOTE — Progress Notes (Signed)
10 Days Post-Op  Subjective: Smiles now, but still complains of same epigastric pain.  Asking for something stronger for pain.  Says the pills are not strong enough.  Eating without problems, NO BM since 8/31, on Miralax,  Objective: Vital signs in last 24 hours: Temp:  [98.1 F (36.7 C)-98.7 F (37.1 C)] 98.1 F (36.7 C) (09/04 0640) Pulse Rate:  [109-120] 112  (09/04 0640) Resp:  [20] 20  (09/04 0640) BP: (113-143)/(73-99) 113/73 mmHg (09/04 0640) SpO2:  [97 %-98 %] 97 % (09/04 0640) Last BM Date: 11/29/11  I/O=?, Currently NPO for EGD.Afebrile, DBP up some  Intake/Output from previous day: 09/03 0701 - 09/04 0700 In: 120 [I.V.:120] Out: -  Intake/Output this shift:    General appearance: alert, cooperative and no distress Resp: clear to auscultation bilaterally GI: soft, non-tender; bowel sounds normal; no masses,  no organomegaly and points to area below xyphoid as source of pain.  Lab Results:   Basename 12/02/11 0430  WBC 3.7*  HGB 11.0*  HCT 31.4*  PLT 344    BMET  Basename 12/02/11 0430  NA 139  K 3.9  CL 101  CO2 32  GLUCOSE 114*  BUN 11  CREATININE 0.48*  CALCIUM 9.3   PT/INR No results found for this basename: LABPROT:2,INR:2 in the last 72 hours   Lab 12/02/11 0430 11/27/11 0440  AST 10 12  ALT 7 11  ALKPHOS 52 59  BILITOT 0.5 0.9  PROT 6.5 7.2  ALBUMIN 3.2* 3.5     Lipase     Component Value Date/Time   LIPASE 27 11/24/2011 0445     Studies/Results: No results found.  Medications:    . acetaminophen  1,000 mg Oral TID  . aspirin  81 mg Oral Daily  . citalopram  20 mg Oral Daily  . enalapril  10 mg Oral QHS  . feeding supplement  1 Container Oral TID BM  . heparin  5,000 Units Subcutaneous Q8H  . insulin aspart  0-9 Units Subcutaneous Q4H  . insulin glargine  15 Units Subcutaneous QHS  . lip balm  1 application Topical BID  . metoCLOPramide  10 mg Oral QID  .  morphine injection  2 mg Intravenous Once  . multivitamin  with minerals  1 tablet Oral Daily  . pantoprazole  40 mg Oral Q1200  . polyethylene glycol  17 g Oral Daily    Assessment/Plan Epigastric pain Free air, abdominal pain post lap cholecystectomy 11/15/11 Dr.Gross; s/p diagnostic laparoscopy,11/23/11, with nothing abnormal found as source of air.  Diabetes mellitus type 1 since age 56. HBA1C 12.9  Hypertension, currently with poor control  Hx of recurrent DKA Last hospitalization 09/17/11.    Post op ileus,  Still needing pain meds   Plan:  EGD, today.  I will give her a dulcolax later today,.  She walked twice yesterday.       LOS: 11 days    [name] 12/03/2011

## 2011-12-03 NOTE — Progress Notes (Signed)
Last blood sugar 58. Pt given 1 Carbohydrate snack. Blood sugar anticipated to increase. Will continue to monitor patient.   MCCLAIN, Caelie [place] L 12/03/2011 12:06 AM

## 2011-12-03 NOTE — Progress Notes (Addendum)
Pt currently getting EGD. Prelim results show significant retained gastric contents suggestive of gastroparesis.   Will order Gastric emptying study. Will stop reglan for study.  If pt does have gastroparesis, we have really nothing to offer this patient surgically. This problem will be best managed by GI and hopefully can be continued to be managed/treated as outpt  [name]. Redmond Pulling, MD, FACS General, Bariatric, & Minimally Invasive Surgery [facility], [place]

## 2011-12-03 NOTE — Progress Notes (Signed)
Occupational Therapy Note Chart reviewed. Checked in with pt/spouse and pt feels comfortable with all ADL for home. Pt's spouse has stated he will be with her at discharge and help PRN. Asked about a shower chair to see if pt needs one for energy conservation but she states she feels she will be ok for duration of a shower. Goals set for independent level and pt is currently at supervision level. Since spouse will be with her at discharge feel no more acute OT needed at this time. Encouraged pt to continue to get up with family. Will sign off and pt in agreement.  Judithann Sauger OTR/L [phone] 12/03/2011

## 2011-12-04 ENCOUNTER — Inpatient Hospital Stay (HOSPITAL_COMMUNITY): Payer: 59

## 2011-12-04 ENCOUNTER — Encounter (HOSPITAL_COMMUNITY): Payer: Self-pay

## 2011-12-04 ENCOUNTER — Encounter (HOSPITAL_COMMUNITY): Payer: Self-pay | Admitting: Gastroenterology

## 2011-12-04 DIAGNOSIS — K811 Chronic cholecystitis: Secondary | ICD-10-CM

## 2011-12-04 DIAGNOSIS — R079 Chest pain, unspecified: Secondary | ICD-10-CM

## 2011-12-04 DIAGNOSIS — R0602 Shortness of breath: Secondary | ICD-10-CM

## 2011-12-04 DIAGNOSIS — E1065 Type 1 diabetes mellitus with hyperglycemia: Secondary | ICD-10-CM

## 2011-12-04 LAB — COMPREHENSIVE METABOLIC PANEL
ALT: 15 U/L (ref 0–35)
AST: 16 U/L (ref 0–37)
Albumin: 3.6 g/dL (ref 3.5–5.2)
Alkaline Phosphatase: 56 U/L (ref 39–117)
BUN: 11 mg/dL (ref 6–23)
CO2: 25 mEq/L (ref 19–32)
Calcium: 9.5 mg/dL (ref 8.4–10.5)
Chloride: 98 mEq/L (ref 96–112)
Creatinine, Ser: 0.34 mg/dL — ABNORMAL LOW (ref 0.50–1.10)
GFR calc Af Amer: >90 mL/min (ref >90)
GFR calc non Af Amer: >90 mL/min (ref >90)
Glucose, Bld: 193 mg/dL — ABNORMAL HIGH (ref 70–99)
Potassium: 3.8 mEq/L (ref 3.5–5.1)
Sodium: 136 mEq/L (ref 135–145)
Total Bilirubin: 0.6 mg/dL (ref 0.3–1.2)
Total Protein: 7.2 g/dL (ref 6.0–8.3)

## 2011-12-04 LAB — D-DIMER, QUANTITATIVE (NOT AT ARMC): D-Dimer, Quant: 0.57 ug/mL-FEU — ABNORMAL HIGH (ref 0.00–0.48)

## 2011-12-04 LAB — GLUCOSE, CAPILLARY
Glucose-Capillary: 128 mg/dL — ABNORMAL HIGH (ref 70–99)
Glucose-Capillary: 149 mg/dL — ABNORMAL HIGH (ref 70–99)
Glucose-Capillary: 150 mg/dL — ABNORMAL HIGH (ref 70–99)
Glucose-Capillary: 151 mg/dL — ABNORMAL HIGH (ref 70–99)
Glucose-Capillary: 168 mg/dL — ABNORMAL HIGH (ref 70–99)
Glucose-Capillary: 184 mg/dL — ABNORMAL HIGH (ref 70–99)

## 2011-12-04 LAB — CBC
HCT: 35.2 % — ABNORMAL LOW (ref 36.0–46.0)
Hemoglobin: 12.3 g/dL (ref 12.0–15.0)
MCH: 30.4 pg (ref 26.0–34.0)
MCHC: 34.9 g/dL (ref 30.0–36.0)
MCV: 87.1 fL (ref 78.0–100.0)
Platelets: 367 10*3/uL (ref 150–400)
RBC: 4.04 MIL/uL (ref 3.87–5.11)
RDW: 12.6 % (ref 11.5–15.5)
WBC: 3.4 10*3/uL — ABNORMAL LOW (ref 4.0–10.5)

## 2011-12-04 LAB — TROPONIN I
Troponin I: <0.3 ng/mL (ref <0.30)
Troponin I: <0.3 ng/mL (ref <0.30)

## 2011-12-04 IMAGING — CT CT ANGIO CHEST
1 of 8 series · 19 of 37 positions shown · IV contrast (OMNIPAQUE)
Comparison: None.

CLINICAL DATA: Chest pain and shortness of breath.  Elevated D-
dimer.  Tachycardia.

CT ANGIOGRAPHY CHEST
TECHNIQUE: Multidetector CT imaging of the chest using the
standard protocol during bolus administration of intravenous
contrast. Multiplanar reconstructed images including MIPs were
obtained and reviewed to evaluate the vascular anatomy.
Contrast: 80mL OMNIPAQUE IOHEXOL 350 MG/ML SOLN

[Series 9: pe thins @ 1mm · axial · 0.65mm/px · z∈[-266,-11]mm · 19 of 285 slices shown]
[im 15/285  lung]
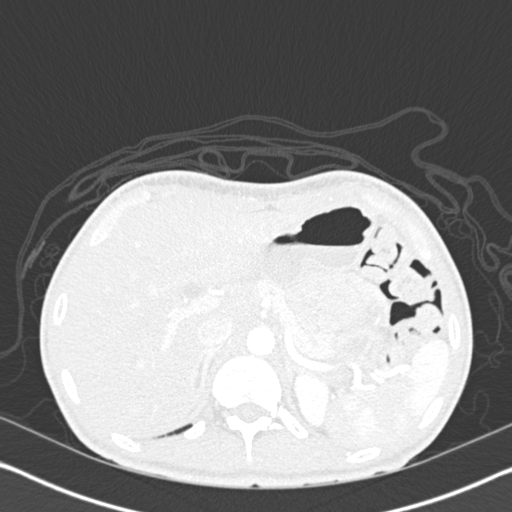
[im 29/285  mediastinal]
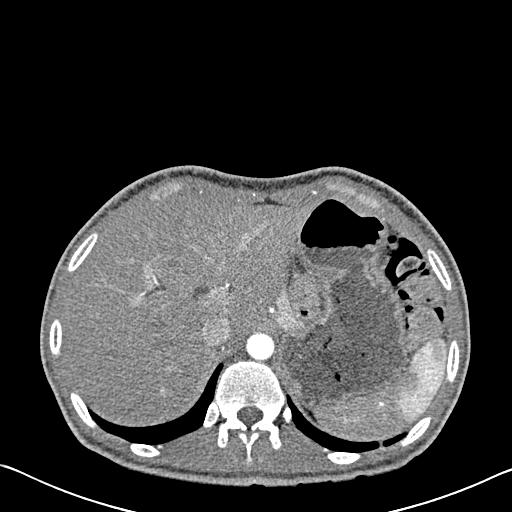
[im 43/285  lung]
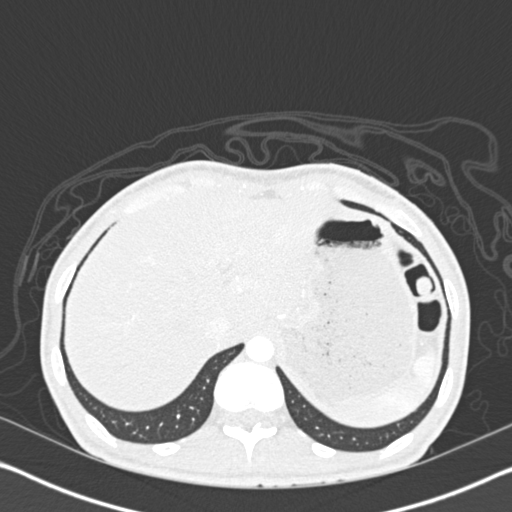
[im 57/285  mediastinal]
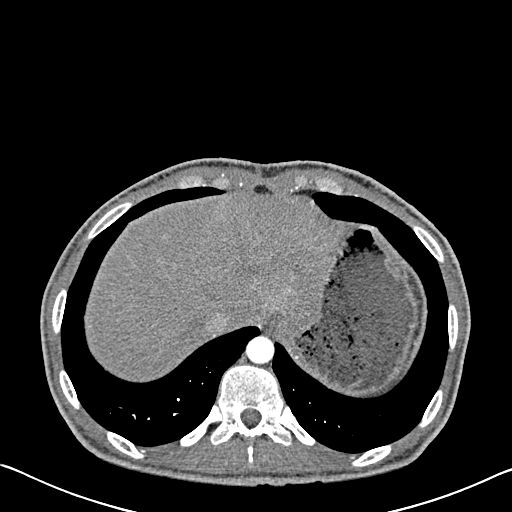
[im 72/285  lung]
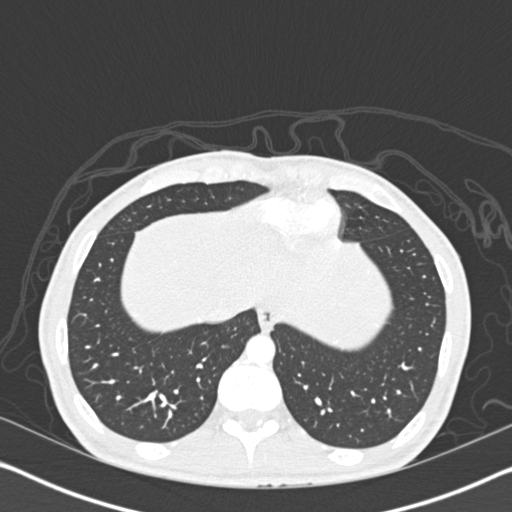
[im 86/285  mediastinal]
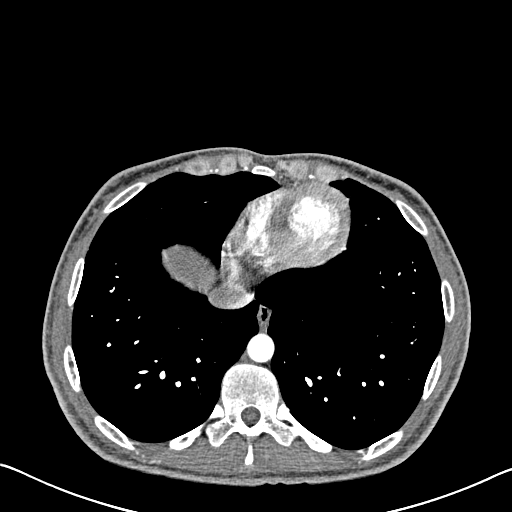
[im 100/285  lung]
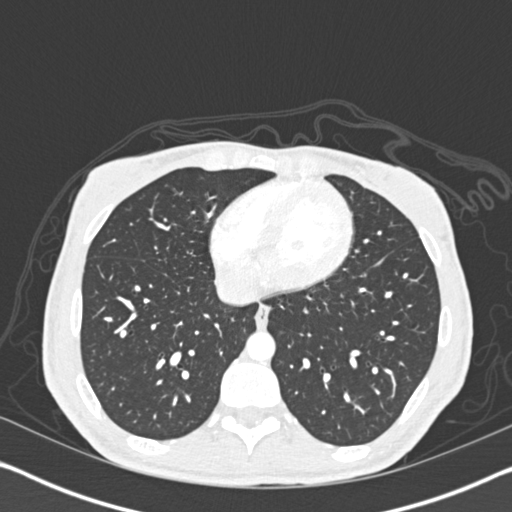
[im 114/285  mediastinal]
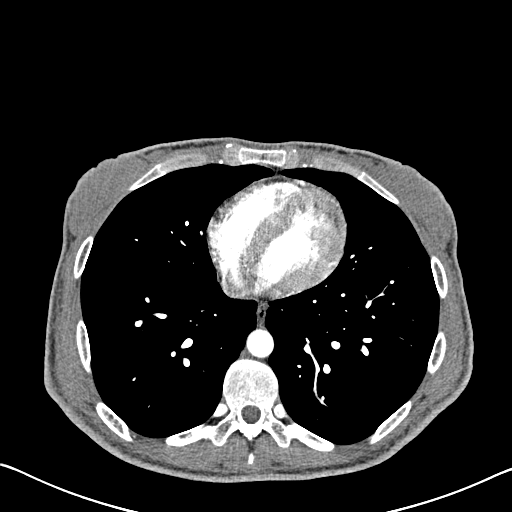
[im 128/285  lung]
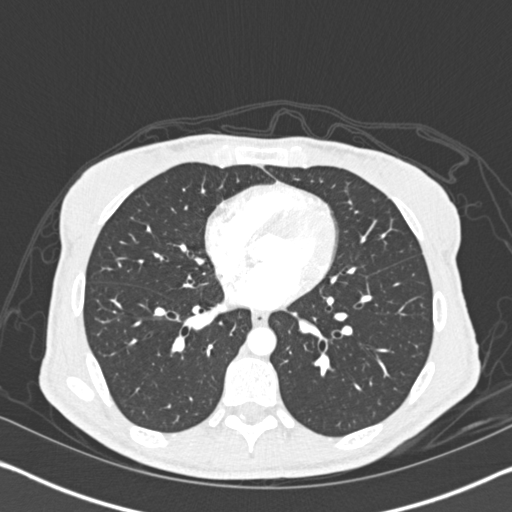
[im 143/285  mediastinal]
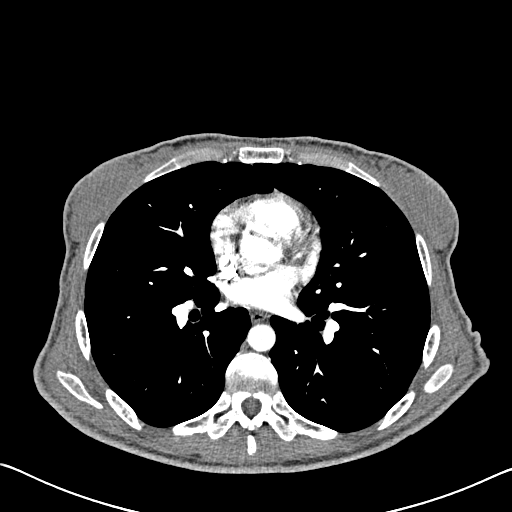
[im 157/285  lung]
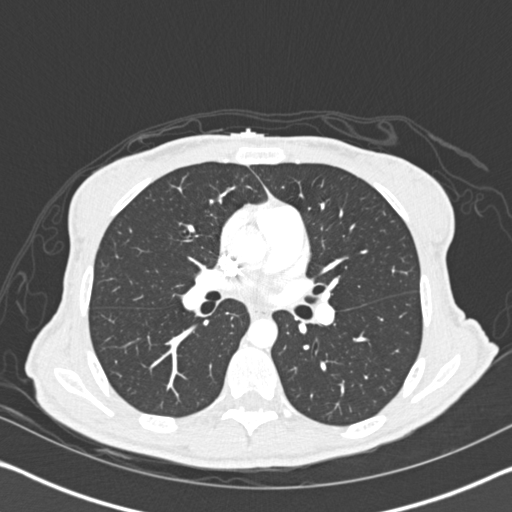
[im 171/285  mediastinal]
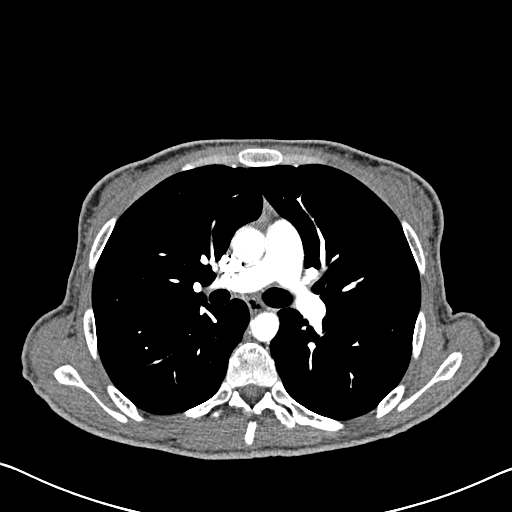
[im 185/285  lung]
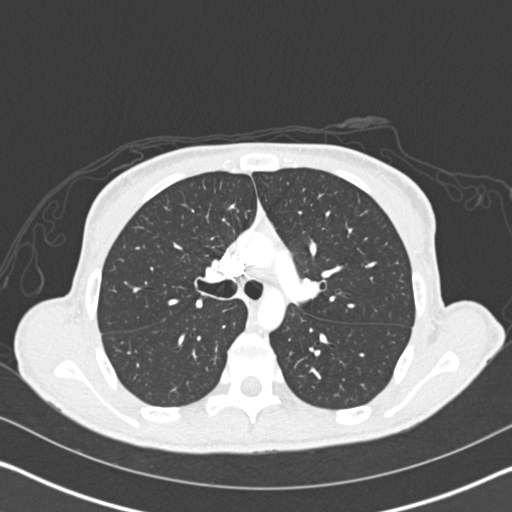
[im 199/285  mediastinal]
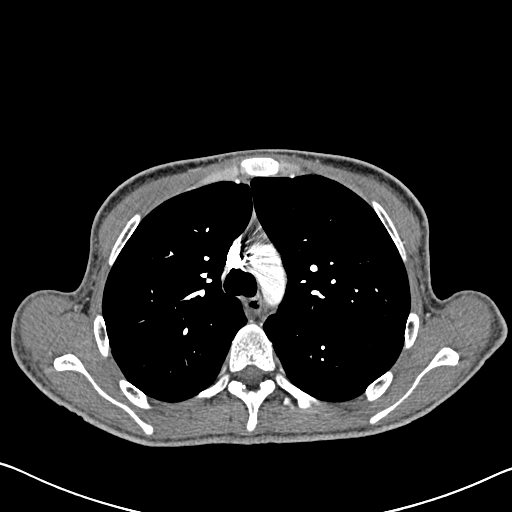
[im 214/285  lung]
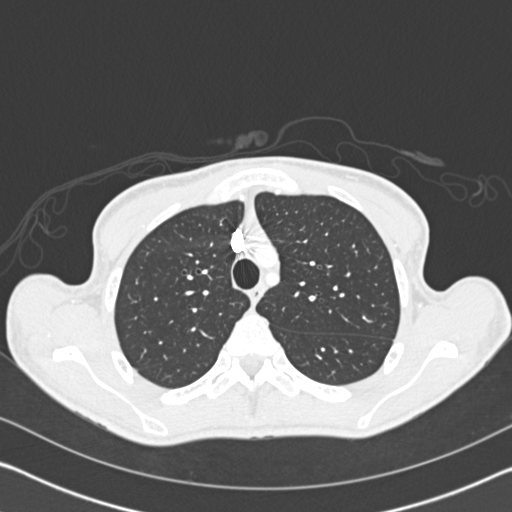
[im 228/285  mediastinal]
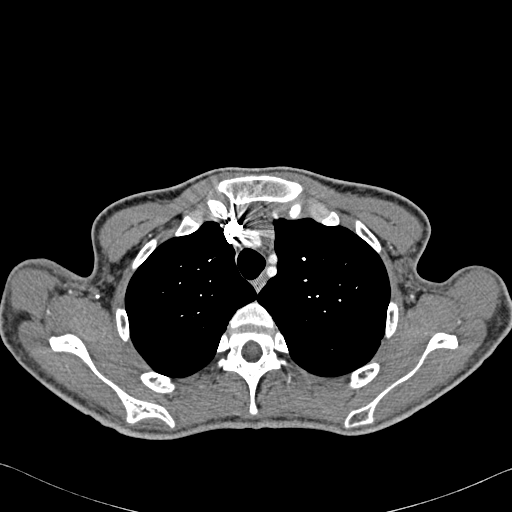
[im 242/285  lung]
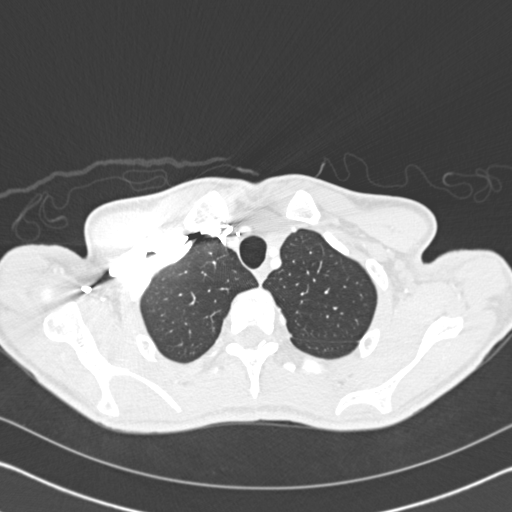
[im 256/285  mediastinal]
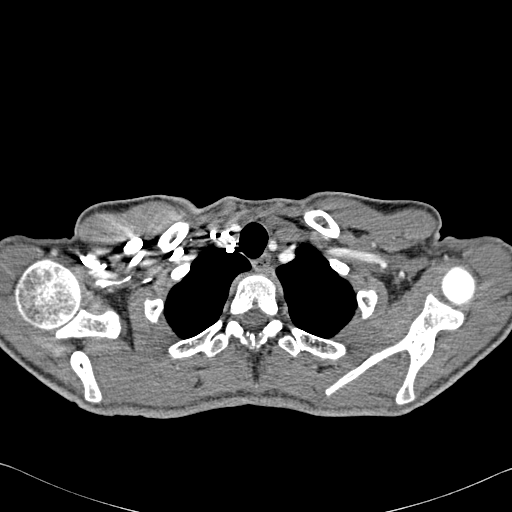
[im 270/285  lung]
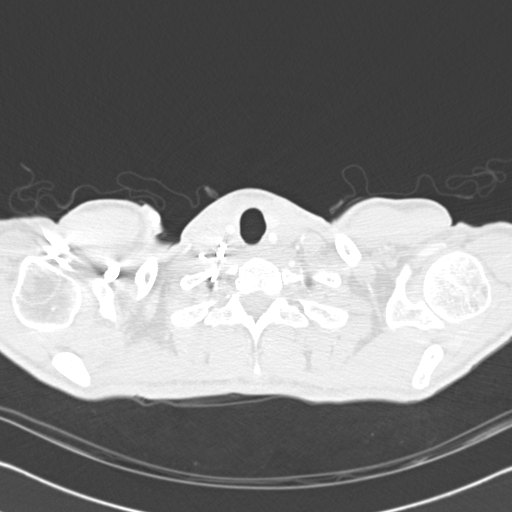

[19 of 37 positions shown; findings below may reference images not displayed]

FINDINGS: Satisfactory opacification of the pulmonary arteries
noted, and there is no evidence of pulmonary emboli.  No evidence
of thoracic aortic aneurysm or dissection.  No evidence of
mediastinal hematoma or mass.

No evidence of thoracic lymphadenopathy.  No evidence of pleural or
pericardial effusion.  Both lungs are clear.  No central
endobronchial lesion identified.
IMPRESSION: Negative.  No evidence of pulmonary embolism or other
active disease within the thorax.

## 2011-12-04 MED ORDER — MORPHINE SULFATE 2 MG/ML IJ SOLN
1.0000 mg | INTRAMUSCULAR | Status: DC | PRN
  Administered 2011-12-04 (×3): 2 mg via INTRAVENOUS
  Filled 2011-12-04 (×3): qty 1

## 2011-12-04 MED ORDER — IOHEXOL 350 MG/ML SOLN
80.0000 mL | Freq: Once | INTRAVENOUS | Status: AC | PRN
  Administered 2011-12-04: 80 mL via INTRAVENOUS

## 2011-12-04 MED ORDER — OXYCODONE HCL 5 MG PO TABS: 5.0000 mg | ORAL_TABLET | ORAL | Status: DC | PRN

## 2011-12-04 MED ORDER — MORPHINE SULFATE 2 MG/ML IJ SOLN
2.0000 mg | INTRAMUSCULAR | Status: DC | PRN
  Administered 2011-12-04 – 2011-12-05 (×5): 2 mg via INTRAVENOUS
  Filled 2011-12-04 (×5): qty 1

## 2011-12-04 MED FILL — Diphenhydramine HCl Inj 50 MG/ML: INTRAMUSCULAR | Qty: 1 | Status: AC

## 2011-12-04 NOTE — Progress Notes (Signed)
Subjective: Severe chest and epigastric pain.  Objective: Vital signs in last 24 hours: Temp:  [97.9 F (36.6 C)-98.4 F (36.9 C)] 98 F (36.7 C) (09/05 0601) Pulse Rate:  [112-137] 137  (09/05 0805) Resp:  [14-61] 24  (09/05 0805) BP: (114-170)/(75-114) 126/84 mmHg (09/05 0805) SpO2:  [98 %-100 %] 99 % (09/05 0805) Last BM Date: 11/29/11  Intake/Output from previous day:   Intake/Output this shift:    General appearance: alert and fatigued GI: tender in the epigastrium  Lab Results:  Basename 12/04/11 0948 12/02/11 0430  WBC 3.4* 3.7*  HGB 12.3 11.0*  HCT 35.2* 31.4*  PLT 367 344   BMET  Basename 12/04/11 0948 12/02/11 0430  NA 136 139  K 3.8 3.9  CL 98 101  CO2 25 32  GLUCOSE 193* 114*  BUN 11 11  CREATININE 0.34* 0.48*  CALCIUM 9.5 9.3   LFT  Basename 12/04/11 0948  PROT 7.2  ALBUMIN 3.6  AST 16  ALT 15  ALKPHOS 56  BILITOT 0.6  BILIDIR --  IBILI --   PT/INR No results found for this basename: LABPROT:2,INR:2 in the last 72 hours Hepatitis Panel No results found for this basename: HEPBSAG,HCVAB,HEPAIGM,HEPBIGM in the last 72 hours C-Diff No results found for this basename: CDIFFTOX:3 in the last 72 hours Fecal Lactopherrin No results found for this basename: FECLLACTOFRN in the last 72 hours  Studies/Results: No results found.  Medications:  Scheduled:   . acetaminophen  1,000 mg Oral TID  . aspirin  81 mg Oral Daily  . citalopram  20 mg Oral Daily  . enalapril  10 mg Oral QHS  . feeding supplement  1 Container Oral TID BM  . heparin  5,000 Units Subcutaneous Q8H  . insulin aspart  0-9 Units Subcutaneous Q4H  . insulin glargine  15 Units Subcutaneous QHS  . lip balm  1 application Topical BID  . multivitamin with minerals  1 tablet Oral Daily  . pantoprazole  40 mg Oral Q1200  . polyethylene glycol  17 g Oral Daily  . DISCONTD: metoCLOPramide  10 mg Oral QID   Continuous:   . sodium chloride 50 mL/hr at 12/01/11 2256  . sodium  chloride 20 mL/hr at 12/03/11 1800    Assessment/Plan: 1) Probable gastroparesis. 2) Chest pain with elevated D-dimer.   The patient is going to have a CT scan of her chest.  She was not able to undergo the gastric emptying scan as a result of the chest pain.  The gastric emptying scan is to confirm that she has gastroparesis.  My plan is to start her on erythromycin to see if this can help with the situation.  Plan: 1) Work up for PE in progress. 2) Await gastric emptying scan.  LOS: 12 days   Terryl Molinelli D 12/04/2011, 1:04 PM

## 2011-12-04 NOTE — Progress Notes (Signed)
VASCULAR LAB PRELIMINARY  PRELIMINARY  PRELIMINARY  PRELIMINARY  Bilateral lower extremity venous duplex completed.    Preliminary report:  Bilateral:  No evidence of DVT, superficial thrombosis, or Baker's Cyst.   Rooney Gladwin, RVS 12/04/2011, 2:03 PM

## 2011-12-04 NOTE — Consult Note (Signed)
Medical Consultation  [name] KZL:[id number] DOB: 06-30-1990 DOA: 11/22/2011  Referring physician: Greer Pickerel, MD PCP: [name], NP   Date of consultation: 12/04/2011 (nititial consultation 8/28)  Reason for consultation: chest pain, IDDM  Chief Complaint: chest pain, epigastric pain  Impression/Recommendations 1. Chest pain--atypical; reports has been intermittent this hospitalization and seems associated with severe epigastric pain. Suspect non-cardiac, however it is reasonable to check second troponin, place telemetry and check EKG in AM (repeat EKG from today as well). Cannot discount possibility of PE and I agree with d-dimer; if positive, would pursue CTA chest. CTA was negative 10/2011, but patient is post-op, has been resistant to ambulation and has been refusing heparin. Symptoms are atypical and there has been no hypoxia; ST has been noted since admission. Suspect mild tachypnea is secondary to anxiety. Check venous dopplers. This may all be related gastroparesis. 2. Epigastric pain--ddx includes gastroparesis. Management deferred to GI and surgery. 3. IDDM--stable. Hgb A1c 6.1. Referred to endocrinologist, interested in insulin pump as outpatient. Continue Lantus, SSI as inpatient. 4. HTN--stable on enalapril. 5. Severe malnutrition in context of chronic illness--resource breeze TID. Patient meets malnutrition criteria due to 14 lb weight loss from baseline and PO intake reported meets < 75% of estimated energy requirements over 1 month. (per nutrition) 6. Suspected anxiety and depression--not on any outpatient medications for this, started on Cymbalta here. Question whether situational secondary to acute illness, recent surgery, or if subclinical previously. Psychiatry consultation may be useful.  I will followup again in the morning. Please contact me if I can be of assistance in the meanwhile. Thank you for this consultation. Discussed with Will Creig Hines.  [name],  MD  [facility] Team 5 Pager [phone] If 7PM-7AM, please contact night-coverage [email or web address] Password [email or web address]  HPI:  21 year old woman with history of IDDM, HTN s/p chole 8/16 (cystic duct obstruction c/w cholecystitis), presented with abdominal pain 8/24. HR 140. Admitted by general surgery for pneumoperitoneum and emergent laparoscopy, no source was identified. Hospitalist service was consulted 8/28 for assistance with diabetes and HTN. These issues stablilized and hospitalist service signed off 9/1. The patient continued to require frequent IV Dilaudid for epigastric pain, and thus GI was consulted. EGD was performed 9/4 which revealed a large amount of food in the stomach. Gastroparesis is suspected and NM study is pending. Today the patient complained of SOB and chest pain and I was asked to re-evaluate. There have been no labs since 9/3. Troponin, ddimer pending are pending. EKG independently reviewed--ST, probable lead reversal. No definite acute changes.  Hospitalization has been notable for: ST present since admission. Has requiried encouragement to walk. Has been refusing heparin with last dose 8/30. Has expressed concerns over job loss. Pain control has been difficult, requiring frequent IV medication.  On my interview, patient reports chest pain earlier today for a few minutes, which was retrosternal, she associates this pain with uncontrolled epigastric pain. She denies SOB and chest pain now. Her DM appears to be adequately controlled, as is blood pressure. She has had a persistent tachycardia since admission. Of note, CTA chest 8/9 showed no evidence of PE.  Review of Systems:  Negative for fever, visual changes, sore throat, rash, dysuria, bleeding. Positive for nausea.  Past Medical History  Diagnosis Date  . Diabetes mellitus   . Hypertension   . Diabetes type 1, uncontrolled 11/14/2011    Since age 4   Past Surgical History  Procedure Date  . Ankle surgery   .  Cholecystectomy 11/15/2011  Procedure: LAPAROSCOPIC CHOLECYSTECTOMY WITH INTRAOPERATIVE CHOLANGIOGRAM;  Surgeon: [name], MD;  Location: [facility];  Service: General;  Laterality: N/A;  . Laparoscopy 11/23/2011    Procedure: LAPAROSCOPY DIAGNOSTIC;  Surgeon: Edward Jolly, MD;  Location: [facility];  Service: General;  Laterality: N/A;  . Esophagogastroduodenoscopy 12/03/2011    Procedure: ESOPHAGOGASTRODUODENOSCOPY (EGD);  Surgeon: Beryle Beams, MD;  Location: [facility];  Service: Endoscopy;  Laterality: N/A;   Social History:  reports that she has never smoked. She has never used smokeless tobacco. She reports that she does not drink alcohol or use illicit drugs.  No Known Allergies  Family History  Problem Relation Age of Onset  . Diabetes Mother   . Hypertension Father     Prior to Admission medications   Medication Sig Start Date End Date Taking? Authorizing Provider  aspirin 325 MG tablet Take 325 mg by mouth daily.   Yes Historical Provider, MD  enalapril (VASOTEC) 10 MG tablet Take 10 mg by mouth every evening.    Yes Historical Provider, MD  HYDROcodone-acetaminophen (NORCO/VICODIN) 5-325 MG per tablet Take 1 tablet by mouth every 6 (six) hours as needed. Pain   Yes Historical Provider, MD  insulin aspart (NOVOLOG) 100 UNIT/ML injection Inject 0-15 Units into the skin 3 (three) times daily before meals. 5 u with each meal + sliding scale: CBG 70 - 120: 0 u: CBG 121 - 150: 2 u; CBG 151 - 200: 3 u; CBG 201 - 250: 5 u; CBG 251 - 300: 8 u;CBG 301 - 350: 11 u; CBG 351 - 400: 15 u; CBG > 400 Call MD 09/20/11 09/19/12 Yes [name], MD  insulin glargine (LANTUS) 100 UNIT/ML injection Inject 30 Units into the skin at bedtime.  09/20/11  Yes Venetia Maxon Rama, MD  metFORMIN (GLUCOPHAGE) 500 MG tablet Take 500 mg by mouth 2 (two) times daily with a meal.   Yes Historical Provider, MD  metoCLOPramide (REGLAN) 10 MG tablet Take 10 mg by mouth 4 (four) times daily.   Yes Historical  Provider, MD  Multiple Vitamin (MULTIVITAMIN WITH MINERALS) TABS Take 1 tablet by mouth daily.   Yes Historical Provider, MD  omeprazole (PRILOSEC) 20 MG capsule Take 20 mg by mouth daily.   Yes Historical Provider, MD   Physical Exam:  Filed Vitals:   12/03/11 2152 12/04/11 0601 12/04/11 0740 12/04/11 0805  BP: 127/88 157/104 114/75 126/84  Pulse: 118 112 123 137  Temp: 98.4 F (36.9 C) 98 F (36.7 C)    TempSrc: Oral Oral    Resp: 19 19 22 24   Height:      Weight:      SpO2: 99% 98% 99% 99%     General:  Appears anxious. Tearful. Non-toxic.  Eyes: PERRL, normal lids, irises  ENT: grossly normal hearing, lips and tongue appear normal  Neck: no LAD, masses, or thyromegaly  Cardiovascular: tachycardic, RR, no m/r/g. No LE edema.  Respiratory: CTA bilaterally, no w/r/r. Normal respiratory effort.  Abdomen: soft, ntnd  Skin: no rash or induration seen, non-tender  Musculoskeletal: grossly normal tone upper and lower extremities  Psychiatric: anxious, tearful, appears depressed  Neurologic: grossly normal  Labs on Admission:  Basic Metabolic Panel:  Lab 37/48/27 0430  NA 139  K 3.9  CL 101  CO2 32  GLUCOSE 114*  BUN 11  CREATININE 0.48*  CALCIUM 9.3  MG --  PHOS --   Liver Function Tests:  Lab 12/02/11 0430  AST 10  ALT 7  ALKPHOS 52  BILITOT 0.5  PROT 6.5  ALBUMIN 3.2*   CBC:  Lab 12/02/11 0430  WBC 3.7*  NEUTROABS --  HGB 11.0*  HCT 31.4*  MCV 87.7  PLT 344   CBG:  Lab 12/04/11 0423 12/04/11 0010 12/03/11 2033 12/03/11 1458 12/03/11 0819  GLUCAP 151* 150* 147* 169* 202*    Radiological Exams on Admission:  Time Spent: 50 minutes  Evaluated by PT--no needs Evaluated by OT--no needs   [name], MD  [facility] Pager [phone]  If 7PM-7AM, please contact night-coverage [email or web address] Password [email or web address] 12/04/2011, 10:04 AM

## 2011-12-04 NOTE — Progress Notes (Signed)
1 Day Post-Op  Subjective: Complains of severe chest pain stomach and chest.  Points to mid epigastric and mid chest.  She says it worse than any she's had before and is tearful.  She has heparin ordered but has been refusing shots and as noted before not walking much.    Objective: Vital signs in last 24 hours: Temp:  [97.9 F (36.6 C)-98.7 F (37.1 C)] 98 F (36.7 C) (09/05 0601) Pulse Rate:  [112-137] 137  (09/05 0805) Resp:  [14-61] 24  (09/05 0805) BP: (114-170)/(75-114) 126/84 mmHg (09/05 0805) SpO2:  [98 %-100 %] 99 % (09/05 0805) Last BM Date: 11/29/11  Intake/Output from previous day:   Intake/Output this shift:    General appearance: alert and crying some with pain.  She has O2 on now, but sats in the 99 range prior to placing her back on O2. Resp: clear to auscultation bilaterally Chest wall: no tenderness, She has some tenderness, with palpation of the xyphoid. Cardio: regular rate and rhythm, S1, S2 normal, no murmur, click, rub or gallop and tachycardic.  HR 132 I dont see any major chages on EKG except a flip in lead 1. GI: soft, non-tender; bowel sounds normal; no masses,  no organomegaly and Abdomen is soft, not tender to palpation, incisions are healing nicely  Lab Results:   Basename 12/02/11 0430  WBC 3.7*  HGB 11.0*  HCT 31.4*  PLT 344    BMET  Basename 12/02/11 0430  NA 139  K 3.9  CL 101  CO2 32  GLUCOSE 114*  BUN 11  CREATININE 0.48*  CALCIUM 9.3   PT/INR No results found for this basename: LABPROT:2,INR:2 in the last 72 hours   Lab 12/02/11 0430  AST 10  ALT 7  ALKPHOS 52  BILITOT 0.5  PROT 6.5  ALBUMIN 3.2*     Lipase     Component Value Date/Time   LIPASE 27 11/24/2011 0445     Studies/Results: No results found.  Medications:    . acetaminophen  1,000 mg Oral TID  . aspirin  81 mg Oral Daily  . citalopram  20 mg Oral Daily  . enalapril  10 mg Oral QHS  . feeding supplement  1 Container Oral TID BM  . heparin   5,000 Units Subcutaneous Q8H  . insulin aspart  0-9 Units Subcutaneous Q4H  . insulin glargine  15 Units Subcutaneous QHS  . lip balm  1 application Topical BID  . multivitamin with minerals  1 tablet Oral Daily  . pantoprazole  40 mg Oral Q1200  . polyethylene glycol  17 g Oral Daily  . DISCONTD: metoCLOPramide  10 mg Oral QID    Assessment/Plan Epigastric pain (uncontrolled pain issue) Free air, abdominal pain post lap cholecystectomy 11/15/11 [name]; s/p diagnostic laparoscopy,11/23/11, with nothing abnormal found as source of air.  Diabetes mellitus type 1 since age 13. HBA1C 12.9  Hypertension, currently with poor control  Hx of recurrent DKA Last hospitalization 09/17/11.    Post op ileus,  Still needing pain medsEKG noted  Plan:  Check labs, d dimer, ck/trop, ask Hospitalist to see again. CT scan is negative for PE. I will see if we can get motility study today, i do not see the erythromycin [name] mentioned in his note. [name] has also called Psychiatry to see her.    LOS: 12 days    [name] 12/04/2011

## 2011-12-04 NOTE — Progress Notes (Signed)
Pt c/o shortness of breath and chest pain.  EKG complete and vs charted.  Zola Button, PA called and informed.

## 2011-12-04 NOTE — Progress Notes (Signed)
Discussed events with PA. Agree with work-up. Appreciate Dr Everett Graff assistance.  Ct pe negative Labs ok Duplex LE - no dvt (prelim)  There is not much we can offer this pt from a surgical point of view for highly likely delayed gastric emptying  [name]. Redmond Pulling, MD, FACS General, Bariatric, & Minimally Invasive Surgery [facility], [place]

## 2011-12-05 ENCOUNTER — Inpatient Hospital Stay (HOSPITAL_COMMUNITY): Payer: 59

## 2011-12-05 DIAGNOSIS — K3184 Gastroparesis: Secondary | ICD-10-CM

## 2011-12-05 DIAGNOSIS — F411 Generalized anxiety disorder: Secondary | ICD-10-CM

## 2011-12-05 DIAGNOSIS — F329 Major depressive disorder, single episode, unspecified: Secondary | ICD-10-CM

## 2011-12-05 LAB — GLUCOSE, CAPILLARY
Glucose-Capillary: 163 mg/dL — ABNORMAL HIGH (ref 70–99)
Glucose-Capillary: 175 mg/dL — ABNORMAL HIGH (ref 70–99)

## 2011-12-05 MED ORDER — METOCLOPRAMIDE HCL 5 MG/ML IJ SOLN
10.0000 mg | Freq: Four times a day (QID) | INTRAMUSCULAR | Status: DC
  Administered 2011-12-05 – 2011-12-06 (×5): 10 mg via INTRAVENOUS
  Filled 2011-12-05 (×8): qty 2

## 2011-12-05 MED ORDER — TECHNETIUM TC 99M SULFUR COLLOID: 2.1000 | Freq: Once | INTRAVENOUS | Status: AC | PRN

## 2011-12-05 MED ORDER — DOCUSATE SODIUM 100 MG PO CAPS
100.0000 mg | ORAL_CAPSULE | Freq: Two times a day (BID) | ORAL | Status: DC | PRN
  Administered 2011-12-05: 100 mg via ORAL
  Filled 2011-12-05 (×2): qty 1

## 2011-12-05 MED ORDER — SODIUM CHLORIDE 0.9 % IV SOLN
250.0000 mg | Freq: Four times a day (QID) | INTRAVENOUS | Status: DC
  Administered 2011-12-05 – 2011-12-06 (×5): 250 mg via INTRAVENOUS
  Filled 2011-12-05 (×7): qty 250

## 2011-12-05 NOTE — Consult Note (Signed)
Patient Identification:  [name] Date of Evaluation:  12/05/2011   History of Present Illness: Patient is awake laying on her side. She states that about 2 months ago she began having abdominal pain that persisted until they decided to do a cholecystectomy. Following the surgery, she continued to have pain that was discovered to be intra-abdominal in her pockets. She said they had to do exploratory lap in order to find the origin of the air pockets and determine if any leakage was occurring from the incisional site. They also did a study EDG to determine she has gastroparesis. She has been eating but the food she ate today was making her feel very uncomfortable. She is depressed and more anxious because the pain she's been feeling has not been relieved. She spoke in a whimpering tone of voice and explains that she was in pain and Tylenol had not relieved the pain. She began crying stating that she wanted her husband her to rub her back but he had to be at work. She was able to control her crying and continue talking.  Past Psychiatric History:  She denies any history of depression, eating disorder as a teenager, cutting; drinking alcohol she may have smoked some weed, she denies drug use. She denies any suicidal thoughts or attempts.   Past Medical History:     Past Medical History  Diagnosis Date  . Diabetes mellitus   . Hypertension   . Diabetes type 1, uncontrolled 11/14/2011    Since age 71       Past Surgical History  Procedure Date  . Ankle surgery   . Cholecystectomy 11/15/2011    Procedure: LAPAROSCOPIC CHOLECYSTECTOMY WITH INTRAOPERATIVE CHOLANGIOGRAM;  Surgeon: [name], MD;  Location: [facility];  Service: General;  Laterality: N/A;  . Laparoscopy 11/23/2011    Procedure: LAPAROSCOPY DIAGNOSTIC;  Surgeon: Mariella Saa, MD;  Location: [facility];  Service: General;  Laterality: N/A;  . Esophagogastroduodenoscopy 12/03/2011    Procedure: ESOPHAGOGASTRODUODENOSCOPY (EGD);   Surgeon: Theda Belfast, MD;  Location: [facility];  Service: Endoscopy;  Laterality: N/A;    Allergies: No Known Allergies  Current Medications:  Prior to Admission medications   Medication Sig Start Date End Date Taking? Authorizing Provider  aspirin 325 MG tablet Take 325 mg by mouth daily.   Yes Historical Provider, MD  enalapril (VASOTEC) 10 MG tablet Take 10 mg by mouth every evening.    Yes Historical Provider, MD  HYDROcodone-acetaminophen (NORCO/VICODIN) 5-325 MG per tablet Take 1 tablet by mouth every 6 (six) hours as needed. Pain   Yes Historical Provider, MD  insulin aspart (NOVOLOG) 100 UNIT/ML injection Inject 0-15 Units into the skin 3 (three) times daily before meals. 5 u with each meal + sliding scale: CBG 70 - 120: 0 u: CBG 121 - 150: 2 u; CBG 151 - 200: 3 u; CBG 201 - 250: 5 u; CBG 251 - 300: 8 u;CBG 301 - 350: 11 u; CBG 351 - 400: 15 u; CBG > 400 Call MD 09/20/11 09/19/12 Yes [name], MD  insulin glargine (LANTUS) 100 UNIT/ML injection Inject 30 Units into the skin at bedtime.  09/20/11  Yes [name], MD  metFORMIN (GLUCOPHAGE) 500 MG tablet Take 500 mg by mouth 2 (two) times daily with a meal.   Yes Historical Provider, MD  metoCLOPramide (REGLAN) 10 MG tablet Take 10 mg by mouth 4 (four) times daily.   Yes Historical Provider, MD  Multiple Vitamin (MULTIVITAMIN WITH  MINERALS) TABS Take 1 tablet by mouth daily.   Yes Historical Provider, MD  omeprazole (PRILOSEC) 20 MG capsule Take 20 mg by mouth daily.   Yes Historical Provider, MD    Social History:    reports that she has never smoked. She has never used smokeless tobacco. She reports that she does not drink alcohol or use illicit drugs.   Family History:    Family History  Problem Relation Age of Onset  . Diabetes Mother   . Hypertension Father     Mental Status Examination/Evaluation: Objective:  Appearance: Disheveled  she has a giant teddy bear dressed in overalls talked in beside her     Psychomotor Activity:  Decreased  Eye Contact::  Good  Speech:  Clear and Coherent and Childlike  Volume:  Decreased  Mood:  Anxious, Depressed and Dysphoric  Affect:  Blunt, Congruent, Depressed and Tearful  Thought Process:  Coherent, Relevant and Intact  Orientation:  Full  Thought Content: Dependent on others; unable to self soothe   Suicidal Thoughts:  No  Homicidal Thoughts:  No  Judgement:  Fair  Insight:  Fair    DIAGNOSIS:   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  AXIS IV economic problems, educational problems, other psychosocial or environmental problems and problems related to social environment  AXIS V 61-70 mild symptoms  Assessment/Plan:  Discussed with RN  Pt is depressed due to pain pre- and post-cholecystectomy. She has received pain medication IV and that has been discontinued to avoid dependence. She is still expecting pain medication and complains that it has not relieved the pain. She likes to have her husband brought her back to relieve the pain. She is encouraged to focus on other thoughts or the television program or anything else of interest in she thinks the pain is becoming unbearable. Using brain distraction will help. Additional medication is not encouraged at this time unless Dr. Irene Limbo feels that that would be advisable. In that case, BuSpar buspirone 10 mg 3 times daily is an option, or Vistaril 25 mg twice daily. 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 She is registered for a course that can be postponed until she is able to resume her studies. She is oriented to person place and time. She has good focus and concentration and memory is intact.  RECOMMENDATION:  1.  This has represented possibly a month of treatment and recovery. Suggest referral to outpatient therapy to help her learn coping skills for challenging  situations.-Would discourage dependency upon medication. 2.  No further psychiatric needs identified unless requested. M.D. Psychiatrist signs off Kamica Florance J. Ferol Luz, MD Psychiatrist  12/05/2011 1:25 PM

## 2011-12-05 NOTE — Progress Notes (Signed)
Nutrition Follow-up  Intervention: D/C Resource Breeze. Educated pt on gastroparesis diet and provided handout of this information. Pt's intake excellent. Nutrition signing off.   Diet Order: CHO modified medium, 75% meal intake  - Pt has been c/o nausea after meals. Pt had EGD yesterday which showed gastroparesis. Met with pt who reports she has been eating excellent. Pt reports she has not been drinking the [employer] because it caused her blood sugars to get too high. Pt denied any nausea.   Meds: Scheduled Meds:   . acetaminophen  1,000 mg Oral TID  . aspirin  81 mg Oral Daily  . citalopram  20 mg Oral Daily  . enalapril  10 mg Oral QHS  . feeding supplement  1 Container Oral TID BM  . heparin  5,000 Units Subcutaneous Q8H  . insulin aspart  0-9 Units Subcutaneous Q4H  . insulin glargine  15 Units Subcutaneous QHS  . lip balm  1 application Topical BID  . multivitamin with minerals  1 tablet Oral Daily  . pantoprazole  40 mg Oral Q1200  . polyethylene glycol  17 g Oral Daily   Continuous Infusions:   . sodium chloride 50 mL/hr at 12/01/11 2256  . sodium chloride 20 mL/hr at 12/03/11 1800   PRN Meds:.bisacodyl, hydrALAZINE, HYDROcodone-acetaminophen, iohexol, magic mouthwash, menthol-cetylpyridinium, metoprolol, morphine injection, ondansetron (ZOFRAN) IV, ondansetron, oxyCODONE, phenol, promethazine, DISCONTD:  morphine injection, DISCONTD: oxyCODONE  Labs:  CMP     Component Value Date/Time   NA 136 12/04/2011 0948   K 3.8 12/04/2011 0948   CL 98 12/04/2011 0948   CO2 25 12/04/2011 0948   GLUCOSE 193* 12/04/2011 0948   BUN 11 12/04/2011 0948   CREATININE 0.34* 12/04/2011 0948   CALCIUM 9.5 12/04/2011 0948   PROT 7.2 12/04/2011 0948   ALBUMIN 3.6 12/04/2011 0948   AST 16 12/04/2011 0948   ALT 15 12/04/2011 0948   ALKPHOS 56 12/04/2011 0948   BILITOT 0.6 12/04/2011 0948   GFRNONAA >90 12/04/2011 0948   GFRAA >90 12/04/2011 0948   Lab Results  Component Value Date   HGBA1C 6.2* 12/02/2011    CBG (last 3)   Basename 12/05/11 0355 12/05/11 0007 12/04/11 2002  GLUCAP 175* 163* 168*     No intake or output data in the 24 hours ending 12/05/11 1119  Last BM - 9/31  Weight Status:   8/26 121 lb 6.4 oz 9/2 120 lb 3.2 oz  Estimated needs:   1850-2050 calories 70-90g protein  Nutrition Dx: Inadequate oral intake - resolved   Goal: Diet to meet >90% of estimated energy needs - met per pt report.   Monitor:  Intake   Levon Hedger MS, RD, LDN [phone] Pager [phone] After Hours Pager

## 2011-12-05 NOTE — Progress Notes (Signed)
Subjective: No acute events.  Objective: Vital signs in last 24 hours: Temp:  [98.1 F (36.7 C)-98.7 F (37.1 C)] 98.4 F (36.9 C) (09/06 0527) Pulse Rate:  [108-125] 108  (09/06 0527) Resp:  [18-24] 18  (09/06 0527) BP: (124-134)/(86-93) 124/86 mmHg (09/06 0527) SpO2:  [96 %-98 %] 98 % (09/06 0527) Last BM Date: 11/29/11  Intake/Output from previous day:   Intake/Output this shift:    General appearance: alert and fatigued GI: tender in the epigastrium  Lab Results:  [facility] 12/04/11 0948  WBC 3.4*  HGB 12.3  HCT 35.2*  PLT 367   BMET  Basename 12/04/11 0948  NA 136  K 3.8  CL 98  CO2 25  GLUCOSE 193*  BUN 11  CREATININE 0.34*  CALCIUM 9.5   LFT  Basename 12/04/11 0948  PROT 7.2  ALBUMIN 3.6  AST 16  ALT 15  ALKPHOS 56  BILITOT 0.6  BILIDIR --  IBILI --   PT/INR No results found for this basename: LABPROT:2,INR:2 in the last 72 hours Hepatitis Panel No results found for this basename: HEPBSAG,HCVAB,HEPAIGM,HEPBIGM in the last 72 hours C-Diff No results found for this basename: CDIFFTOX:3 in the last 72 hours Fecal Lactopherrin No results found for this basename: FECLLACTOFRN in the last 72 hours  Studies/Results: Ct Angio Chest Pe W/cm &/or Wo Cm  12/04/2011  *RADIOLOGY REPORT*  Clinical Data: Chest pain and shortness of breath.  Elevated D- dimer.  Tachycardia.  CT ANGIOGRAPHY CHEST  Technique:  Multidetector CT imaging of the chest using the standard protocol during bolus administration of intravenous contrast. Multiplanar reconstructed images including MIPs were obtained and reviewed to evaluate the vascular anatomy.  Contrast: 98m OMNIPAQUE IOHEXOL 350 MG/ML SOLN  Comparison: None.  Findings: Satisfactory opacification of the pulmonary arteries noted, and there is no evidence of pulmonary emboli.  No evidence of thoracic aortic aneurysm or dissection.  No evidence of mediastinal hematoma or mass.  No evidence of thoracic lymphadenopathy.  No  evidence of pleural or pericardial effusion.  Both lungs are clear.  No central endobronchial lesion identified.  IMPRESSION:  Negative.  No evidence of pulmonary embolism or other active disease within the thorax.   Original Report Authenticated By: JMarlaine Hind M.D.    Nm Gastric Emptying  12/05/2011  *RADIOLOGY REPORT*  Clinical Data:  Epigastric pain, diabetes  NUCLEAR MEDICINE GASTRIC EMPTYING SCAN  Technique:  After oral ingestion of radiolabeled meal, sequential abdominal images were obtained for 120 minutes.  Residual percentage of activity remaining within the stomach was calculated at 60 and 120 minutes.  Radiopharmaceutical: 2.1 mCi [id number] colloid.  Comparison: None.  Findings:Left anerior oblique imaging after the patient ingested the radiopharmaceutical in scrambled egg p.o. There is 91% residual gastric activity at 120 minutes (normal less than 30%).  IMPRESSION  Gastric emptying is delayed.   Original Report Authenticated By: D.Trecia RogersM.D.     Medications:  Scheduled:   . acetaminophen  1,000 mg Oral TID  . aspirin  81 mg Oral Daily  . citalopram  20 mg Oral Daily  . enalapril  10 mg Oral QHS  . erythromycin  250 mg Intravenous Q6H  . heparin  5,000 Units Subcutaneous Q8H  . insulin aspart  0-9 Units Subcutaneous Q4H  . insulin glargine  15 Units Subcutaneous QHS  . lip balm  1 application Topical BID  . metoCLOPramide (REGLAN) injection  10 mg Intravenous Q6H  . multivitamin with minerals  1 tablet  Oral Daily  . pantoprazole  40 mg Oral Q1200  . polyethylene glycol  17 g Oral Daily  . DISCONTD: feeding supplement  1 Container Oral TID BM   Continuous:   . sodium chloride 50 mL/hr at 12/01/11 2256  . sodium chloride 20 mL/hr at 12/03/11 1800    Assessment/Plan: 1) Gastroparesis.   The IV form of erythromycin.  Unfortunately the efficacy only lasts one week, if it does work.  She was on Reglan 10 mg without any benefit.  She needs to be on a high  frequency, low volume, low residue diet, i.e., eat small frequent meals.  She needs to avoid fatty foods and high fiber foods.  Plan: 1) Erythromycin 250 mg IV Q6 hours. 2) Continue with Reglan. 3) Small frequent meals.  LOS: 13 days   Jahrell Hamor D 12/05/2011, 1:28 PM

## 2011-12-05 NOTE — Progress Notes (Signed)
2 Days Post-Op  Subjective: No pain today, eating a salad now.  Objective: Vital signs in last 24 hours: Temp:  [98.1 F (36.7 C)-98.7 F (37.1 C)] 98.4 F (36.9 C) (09/06 0527) Pulse Rate:  [108-125] 108  (09/06 0527) Resp:  [18-24] 18  (09/06 0527) BP: (124-134)/(86-93) 124/86 mmHg (09/06 0527) SpO2:  [96 %-98 %] 98 % (09/06 0527) Last BM Date: 11/29/11  Nothing recorded on I/O. Afebrile, BP up some on and off. Labs yesterday are normal. She is taking morphine frequently  Intake/Output from previous day:   Intake/Output this shift:    General appearance: alert, cooperative and no distress Resp: clear to auscultation bilaterally GI: soft, non-tender; bowel sounds normal; no masses,  no organomegaly and incisions look good  Lab Results:   Basename 12/04/11 0948  WBC 3.4*  HGB 12.3  HCT 35.2*  PLT 367    BMET  Basename 12/04/11 0948  NA 136  K 3.8  CL 98  CO2 25  GLUCOSE 193*  BUN 11  CREATININE 0.34*  CALCIUM 9.5   PT/INR No results found for this basename: LABPROT:2,INR:2 in the last 72 hours   Lab 12/04/11 0948 12/02/11 0430  AST 16 10  ALT 15 7  ALKPHOS 56 52  BILITOT 0.6 0.5  PROT 7.2 6.5  ALBUMIN 3.6 3.2*     Lipase     Component Value Date/Time   LIPASE 27 11/24/2011 0445     Studies/Results: Ct Angio Chest Pe W/cm &/or Wo Cm  12/04/2011  *RADIOLOGY REPORT*  Clinical Data: Chest pain and shortness of breath.  Elevated D- dimer.  Tachycardia.  CT ANGIOGRAPHY CHEST  Technique:  Multidetector CT imaging of the chest using the standard protocol during bolus administration of intravenous contrast. Multiplanar reconstructed images including MIPs were obtained and reviewed to evaluate the vascular anatomy.  Contrast: 73m OMNIPAQUE IOHEXOL 350 MG/ML SOLN  Comparison: None.  Findings: Satisfactory opacification of the pulmonary arteries noted, and there is no evidence of pulmonary emboli.  No evidence of thoracic aortic aneurysm or dissection.  No  evidence of mediastinal hematoma or mass.  No evidence of thoracic lymphadenopathy.  No evidence of pleural or pericardial effusion.  Both lungs are clear.  No central endobronchial lesion identified.  IMPRESSION:  Negative.  No evidence of pulmonary embolism or other active disease within the thorax.   Original Report Authenticated By: JMarlaine Hind M.D.    Nm Gastric Emptying  12/05/2011  *RADIOLOGY REPORT*  Clinical Data:  Epigastric pain, diabetes  NUCLEAR MEDICINE GASTRIC EMPTYING SCAN  Technique:  After oral ingestion of radiolabeled meal, sequential abdominal images were obtained for 120 minutes.  Residual percentage of activity remaining within the stomach was calculated at 60 and 120 minutes.  Radiopharmaceutical: 2.1 mCi Tc-977mulfur colloid.  Comparison: None.  Findings:Left anerior oblique imaging after the patient ingested the radiopharmaceutical in scrambled egg p.o. There is 91% residual gastric activity at 120 minutes (normal less than 30%).  IMPRESSION  Gastric emptying is delayed.   Original Report Authenticated By: D.Trecia RogersM.D.     Medications:    . acetaminophen  1,000 mg Oral TID  . aspirin  81 mg Oral Daily  . citalopram  20 mg Oral Daily  . enalapril  10 mg Oral QHS  . feeding supplement  1 Container Oral TID BM  . heparin  5,000 Units Subcutaneous Q8H  . insulin aspart  0-9 Units Subcutaneous Q4H  . insulin glargine  15 Units  Subcutaneous QHS  . lip balm  1 application Topical BID  . multivitamin with minerals  1 tablet Oral Daily  . pantoprazole  40 mg Oral Q1200  . polyethylene glycol  17 g Oral Daily    Assessment/Plan Epigastric pain (uncontrolled pain issue)  Free air, abdominal pain post lap cholecystectomy 11/15/11 Dr.Gross; s/p diagnostic laparoscopy,11/23/11, with nothing abnormal found as source of air.  Diabetes mellitus type 1 since age 34. HBA1C 12.9  Hypertension, currently with poor control  Hx of recurrent DKA Last hospitalization  09/17/11.    Post op ileus,  Still needing pain medsEKG noted EGD and gastric emptying study show: Left anerior oblique imaging after the patient ingested  the radiopharmaceutical in scrambled egg p.o. There is 91% residual  gastric activity at 120 minutes (normal less than 30%).  Plan:  Gastric emptying confirms what Dr. Benson [name] saw on EGD, significant Gastroparesis.  We are going to start erythromycin IV  And reglan.   Aim for discharge tomorrow on both PO.  I am stopping all narcotics, and she is aware of this.  Her sister and husband are also in the room and aware.  She will need to get follow up with Dr. Karlton Lemon for her diabetes and depression.     LOS: 13 days    Laquanta Hummel 12/05/2011

## 2011-12-05 NOTE — Progress Notes (Signed)
Says pain controlled.  Tolerated breakfast and lunch Denies significant abd/epigastric pain   Gastric emptying scan confirms gastroparesis. Started on erythromycin and reglan Discussed briefly with pt dx of gastroparesis and how this is managed medically and not quickly controlled and managed as outpt Plan to d/c pt home Saturday  If pt remains in hospital, Dr Sarajane Jews as agreed to taker her on his service.  [name]. Redmond Pulling, MD, FACS General, Bariatric, & Minimally Invasive Surgery [facility], [place]

## 2011-12-05 NOTE — Discharge Summary (Signed)
Physician Discharge Summary  Patient ID: [name] MRN: [id number] DOB/AGE: 08-29-1990 21 y.o.  Admit date: 11/22/2011 Discharge date: 12/05/2011  Admission Diagnoses: Pneumoperitoneum, ABD PAIN, CHEST PAIN, POST Laparoscopic cholecystectomy, 11/15/11. For biliary dyskenisia Diabetes mellitus type 1 since age 21. HBA1C 12.9  Hypertension, currently with poor control  Hx of recurrent DKA Last hospitalization 09/17/11.    Post op ileus,    Discharge Diagnoses: Free air, abdominal pain post lap cholecystectomy 11/15/11 Dr.Gross; s/p diagnostic laparoscopy,11/23/11, with nothing abnormal found as source of air, Dr. Excell Seltzer Gastroparesis found on EGD and gastric emptying study Diabetes mellitus type 1 since age 21. HBA1C 12.9  Hypertension, currently with poor control  Hx of recurrent DKA Last hospitalization 09/17/11.    Post op ileus,  Chest pain with PE ruled out Depression  Active Problems:  Chest pain   PROCEDURES: 1. s/p diagnostic laparoscopy, 11/23/11, with nothing abnormal found as source of air, Dr. Excell Seltzer. 2.  EGD:   Dr. Benson [name]. 12/03/2011   Findings:  The gastric lumen was filled with retained gastric contents. This is consistent with gastroparesis. Because of the amount of retained contents, a complete evaluation was not possible or safe.  Hospital Course: patient is a 21 year old insulin-dependent diabetic who one week ago underwent an apparently uneventful laparoscopic cholecystectomy with cholangiogram. She had presented with recurrent and persistent abdominal pain with workup including negative CT scan and HIDA scan showing some evidence of cystic duct obstruction. Her gallbladder appeared somewhat chronically inflamed per the operative note. No other abnormalities were noted in the right or difficulties during surgery. She states she felt okay for a couple days after discharge and has had 2 or 3 days of recurrent epigastric pain similar to what she had before surgery. Today  her pain became much more severe and diffuse in her abdomen. No nausea vomiting. No fever or chills. No urinary symptoms appear She presented to the emergency room. On questioning she takes occasional full-strength aspirin as well as fairly frequent Advil.  Pt was evaluated and take to OR by Dr. Excell Seltzer who could not find any reason for the free air noted of CT scan. She was returned to the floor.  Post op she did very poorly.  She had what appeared to be a post op ileus, nausea and significantly more pain than was reasonable. In addition to this she was extremely depressed, would not smile, and would not walk.  We could not get her to walk, and her husband who was there almost every morning before work could not get her to walk. She was taking an up to 22 mg of dilaudid over a 24 hour period.  We ask medicine to assist.  Even with nothing coming from NG her BP and heart rate were elevated.  She had ongoing continuous nausea. Her diabetes was well controlled and once we got some fluids into her orally her BP meds were resumed. She continued to make almost no progress, remained constipated, and would not walk with resumption of her PO's.  Reglan was added without much improvement. We ask DR. Hung to see on 12/02/11 and he did an EGD with findings consistent with gastroparesis and significant retained stomach contents.  He planned gastric emptying study 9/5, but pt had significant chest and epigastric pain.  Worse than anything she had before.  We also learned she was refusing her heparin, so she was not being adequately covered for DVT.  W/U for PE including CT were negative.  She again insisted  she get IV pain med.  Dr. Sarajane Jews saw her and agreed with chest pain w/u, although unlikely need to be done.  Today she had her Gastric emptying study show 90% retention after 2 hours.  Discussed with DR. Hung and we are restarting Reglan and adding IV erythromycin. We are stopping all narcotics. If she does well  anticipate discharge 12/06/11.  She complained of pain again the following AM, and CT was obtained.  She was seen by DR. Toth and DR. Jacobs GI.  Nothing acute was found and she was discharged for follow up by DR. Mann and DR.Gross.         Disposition: 01-Home or Self Care  Discharge Orders    Future Appointments: Provider: Department: Dept Phone: Center:   12/10/2011 10:00 AM [name], MD [facility] [phone] None     Medication List  As of 12/05/2011 12:32 PM   ASK your doctor about these medications         aspirin 325 MG tablet   Take 325 mg by mouth daily.      enalapril 10 MG tablet   Commonly known as: VASOTEC   Take 10 mg by mouth every evening.      HYDROcodone-acetaminophen 5-325 MG per tablet   Commonly known as: NORCO/VICODIN   Take 1 tablet by mouth every 6 (six) hours as needed. Pain      insulin aspart 100 UNIT/ML injection   Commonly known as: novoLOG   Inject 0-15 Units into the skin 3 (three) times daily before meals. 5 u with each meal + sliding scale: CBG 70 - 120: 0 u: CBG 121 - 150: 2 u; CBG 151 - 200: 3 u; CBG 201 - 250: 5 u; CBG 251 - 300: 8 u;CBG 301 - 350: 11 u; CBG 351 - 400: 15 u; CBG > 400 Call MD      insulin glargine 100 UNIT/ML injection   Commonly known as: LANTUS   Inject 30 Units into the skin at bedtime.      metFORMIN 500 MG tablet   Commonly known as: GLUCOPHAGE   Take 500 mg by mouth 2 (two) times daily with a meal.      metoCLOPramide 10 MG tablet   Commonly known as: REGLAN   Take 10 mg by mouth 4 (four) times daily.      multivitamin with minerals Tabs   Take 1 tablet by mouth daily.      omeprazole 20 MG capsule   Commonly known as: PRILOSEC   Take 20 mg by mouth daily.           Follow-up Information    Follow up with Salena Saner., MD. Schedule an appointment as soon as possible for a visit in 2 weeks. (For medical management and depression)    Contact information:   [address]  [place] [phone]       Follow up with Juanita Craver, MD. Schedule an appointment as soon as possible for a visit in 2 weeks. (Follow up of abdominal pain and gastroparesis)    Contact information:   [address] [place]       Follow up with [name]., MD. Schedule an appointment as soon as possible for a visit in 3 weeks.   Contact information:   [address] [place] [phone]          Signed:  Shila Kruczek 12/05/2011, 12:32 PM

## 2011-12-05 NOTE — Consult Note (Signed)
Medical Consultation  [name] QKM:[id number] DOB: 03-Nov-1990 DOA: 11/22/2011  Referring physician: Greer Pickerel, MD PCP: [name], NP   Reason for consultation: chest pain, IDDM  Impression/Recommendations 1. Chest pain--resolved, secondary to gastroparesis. Cardiac enzymes negative, CTA chest negative, EKG SR, repolarization abnormality. Venous dopplers negative.  2. Gastroparesis--Management deferred to GI. 3. IDDM--stable. Hgb A1c 6.1. Given numbers for endocrinologist, interested in insulin pump as outpatient. Continue Lantus at 1/2 dose, resume meal coverage, SSI as outpatient. 4. HTN--stable on enalapril. 5. Severe malnutrition in context of chronic illness--resource breeze TID. Patient meets malnutrition criteria due to 14 lb weight loss from baseline and PO intake reported meets < 75% of estimated energy requirements over 1 month. (per nutrition) 6. Suspected anxiety and depression--not on any outpatient medications for this, started on Cymbalta here. Question whether situational secondary to acute illness, recent surgery, or if subclinical previously. Psychiatry consultation was requested  I will followup again in the morning. Discussed with Dr. Redmond Pulling and Modena Jansky, discharge is anticipated 9/7. If discharge is delayed, please contact me and I will assume care.  [name], MD  [facility] Team 5 Pager [phone] If 7PM-7AM, please contact night-coverage [email or web address] Password [email or web address]  S:  Feels better, eating ok, some abdominal pain, no chest pain.  Physical Exam:  Filed Vitals:   12/04/11 1403 12/04/11 2139 12/05/11 0527 12/05/11 1428  BP: 134/92 133/93 124/86 109/73  Pulse: 125 112 108 124  Temp: 98.7 F (37.1 C) 98.1 F (36.7 C) 98.4 F (36.9 C) 98 F (36.7 C)  TempSrc: Oral Oral Oral Oral  Resp: 24 18 18 21   Height:      Weight:      SpO2: 96% 98% 98% 98%     General:  Appears less anxious. Non-toxic.  Cardiovascular: tachycardic, RR,  no m/r/g. No LE edema.  Respiratory: CTA bilaterally, no w/r/r. Normal respiratory effort.  Abdomen: soft, ntnd  Labs on Admission:  Basic Metabolic Panel:  Lab 33/83/29 0948 12/02/11 0430  NA 136 139  K 3.8 3.9  CL 98 101  CO2 25 32  GLUCOSE 193* 114*  BUN 11 11  CREATININE 0.34* 0.48*  CALCIUM 9.5 9.3  MG -- --  PHOS -- --   Liver Function Tests:  Lab 12/04/11 0948 12/02/11 0430  AST 16 10  ALT 15 7  ALKPHOS 56 52  BILITOT 0.6 0.5  PROT 7.2 6.5  ALBUMIN 3.6 3.2*   CBC:  Lab 12/04/11 0948 12/02/11 0430  WBC 3.4* 3.7*  NEUTROABS -- --  HGB 12.3 11.0*  HCT 35.2* 31.4*  MCV 87.1 87.7  PLT 367 344   CBG:  Lab 12/05/11 0355 12/05/11 0007 12/04/11 2002 12/04/11 1620 12/04/11 1134  GLUCAP 175* 163* 168* 128* 184*    Radiological Exams on Admission:  Time 20 minutes  Evaluated by PT--no needs Evaluated by OT--no needs   [name], MD  [facility] Pager [phone]  If 7PM-7AM, please contact night-coverage [email or web address] Password [facility] 12/05/2011, 6:33 PM

## 2011-12-06 ENCOUNTER — Inpatient Hospital Stay (HOSPITAL_COMMUNITY): Payer: 59

## 2011-12-06 DIAGNOSIS — K3184 Gastroparesis: Secondary | ICD-10-CM

## 2011-12-06 DIAGNOSIS — R109 Unspecified abdominal pain: Secondary | ICD-10-CM

## 2011-12-06 LAB — BASIC METABOLIC PANEL
BUN: 12 mg/dL (ref 6–23)
CO2: 23 mEq/L (ref 19–32)
Calcium: 9.7 mg/dL (ref 8.4–10.5)
Chloride: 96 mEq/L (ref 96–112)
Creatinine, Ser: 0.33 mg/dL — ABNORMAL LOW (ref 0.50–1.10)
GFR calc Af Amer: >90 mL/min (ref >90)
GFR calc non Af Amer: >90 mL/min (ref >90)
Glucose, Bld: 244 mg/dL — ABNORMAL HIGH (ref 70–99)
Potassium: 4 mEq/L (ref 3.5–5.1)
Sodium: 134 mEq/L — ABNORMAL LOW (ref 135–145)

## 2011-12-06 LAB — CBC WITH DIFFERENTIAL/PLATELET
Basophils Absolute: 0 10*3/uL (ref 0.0–0.1)
Basophils Relative: 0 % (ref 0–1)
Eosinophils Absolute: 0 10*3/uL (ref 0.0–0.7)
Eosinophils Relative: 0 % (ref 0–5)
HCT: 34.7 % — ABNORMAL LOW (ref 36.0–46.0)
Hemoglobin: 12.6 g/dL (ref 12.0–15.0)
Lymphocytes Relative: 22 % (ref 12–46)
Lymphs Abs: 1.1 10*3/uL (ref 0.7–4.0)
MCH: 31.2 pg (ref 26.0–34.0)
MCHC: 36.3 g/dL — ABNORMAL HIGH (ref 30.0–36.0)
MCV: 85.9 fL (ref 78.0–100.0)
Monocytes Absolute: 0.4 10*3/uL (ref 0.1–1.0)
Monocytes Relative: 8 % (ref 3–12)
Neutro Abs: 3.6 10*3/uL (ref 1.7–7.7)
Neutrophils Relative %: 70 % (ref 43–77)
Platelets: 351 10*3/uL (ref 150–400)
RBC: 4.04 MIL/uL (ref 3.87–5.11)
RDW: 12.8 % (ref 11.5–15.5)
WBC: 5.1 10*3/uL (ref 4.0–10.5)

## 2011-12-06 LAB — GLUCOSE, CAPILLARY
Glucose-Capillary: 131 mg/dL — ABNORMAL HIGH (ref 70–99)
Glucose-Capillary: 141 mg/dL — ABNORMAL HIGH (ref 70–99)
Glucose-Capillary: 148 mg/dL — ABNORMAL HIGH (ref 70–99)
Glucose-Capillary: 159 mg/dL — ABNORMAL HIGH (ref 70–99)
Glucose-Capillary: 165 mg/dL — ABNORMAL HIGH (ref 70–99)
Glucose-Capillary: 176 mg/dL — ABNORMAL HIGH (ref 70–99)
Glucose-Capillary: 328 mg/dL — ABNORMAL HIGH (ref 70–99)
Glucose-Capillary: 232 mg/dL — ABNORMAL HIGH (ref 70–99)

## 2011-12-06 IMAGING — CT CT ABD-PELV W/ CM
2 of 4 series · 17 of 46 positions shown, 19 images · IV contrast (OMNIPAQUE)
Comparison: CT of abdomen and pelvis [DATE].

CLINICAL DATA: Abdominal pain.  Epigastric tenderness.  3 weeks
status post cholecystectomy.

CT ABDOMEN AND PELVIS WITH CONTRAST
TECHNIQUE: Multidetector CT imaging of the abdomen and pelvis was
performed following the standard protocol during bolus
administration of intravenous contrast.
Contrast: 100mL OMNIPAQUE IOHEXOL 300 MG/ML  SOLN

[Series 2: rtn a/p with · axial · 0.63mm/px · z∈[-496,-96]mm · 14 of 88 slices shown, 16 images]
[im 4/88  soft-tissue]
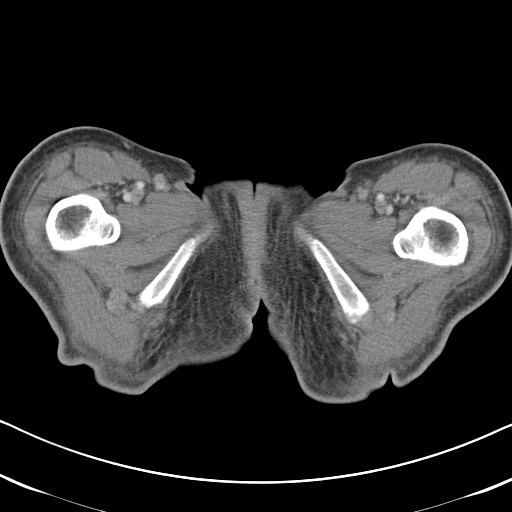
[im 4/88  bone]
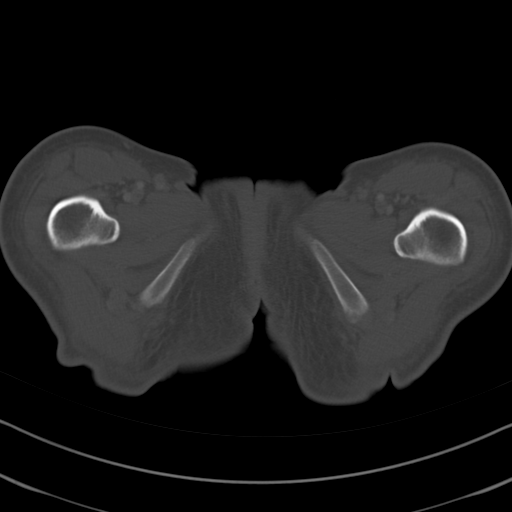
[im 11/88  soft-tissue]
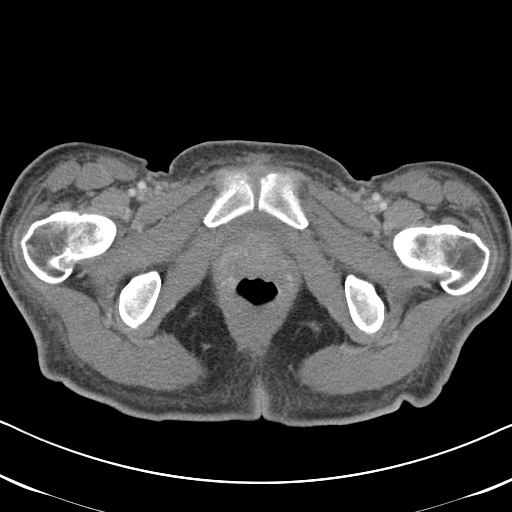
[im 17/88  soft-tissue]
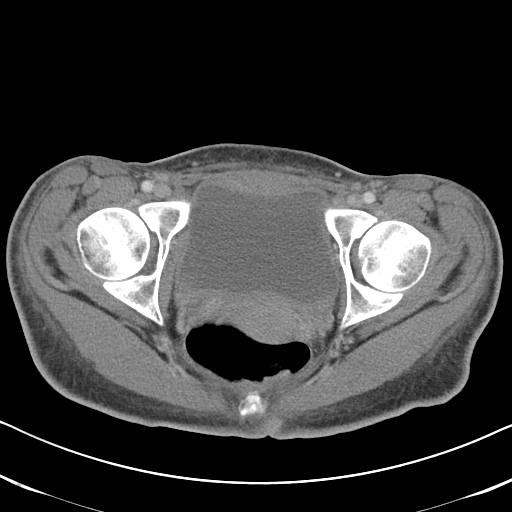
[im 24/88  soft-tissue]
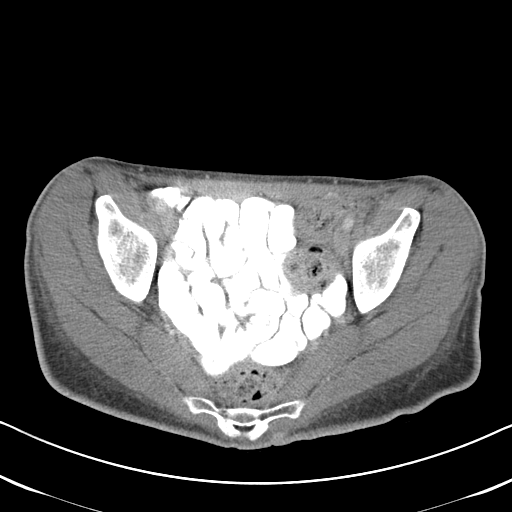
[im 31/88  soft-tissue]
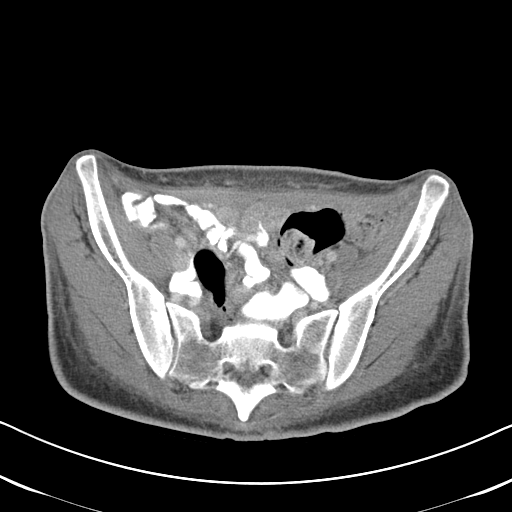
[im 34/88  soft-tissue]
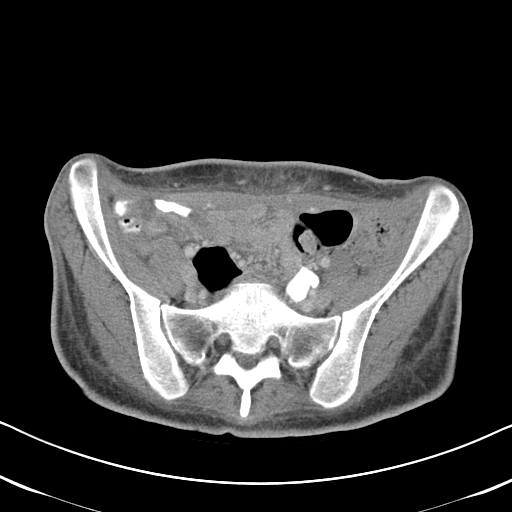
[im 41/88  soft-tissue]
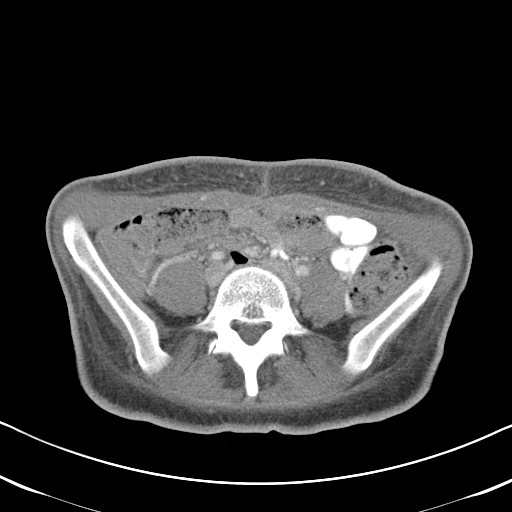
[im 47/88  soft-tissue]
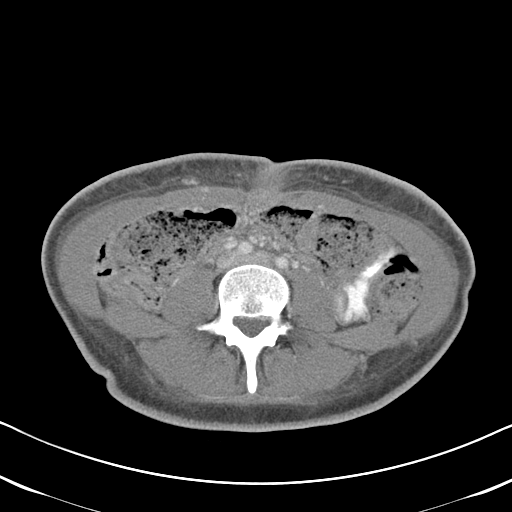
[im 54/88  soft-tissue]
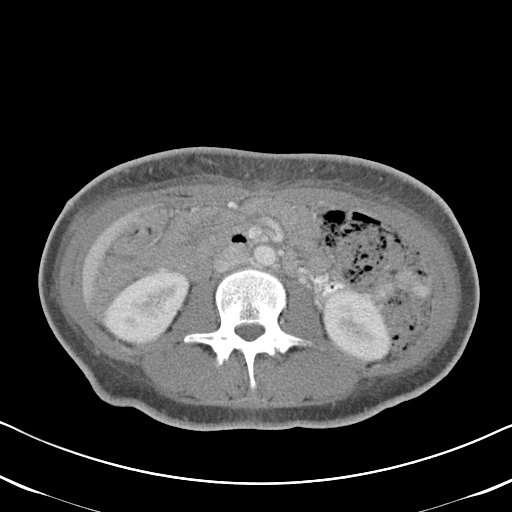
[im 54/88  bone]
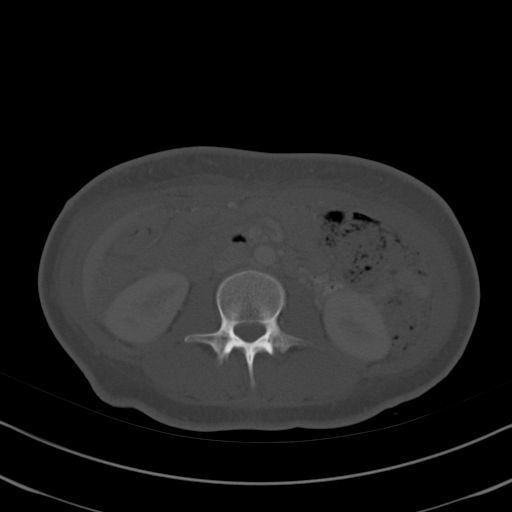
[im 57/88  soft-tissue]
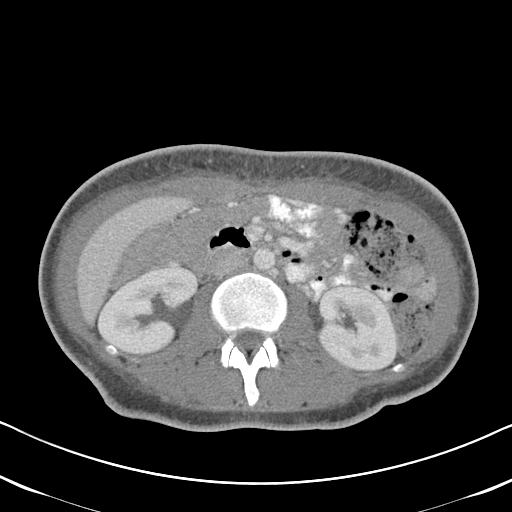
[im 64/88  soft-tissue]
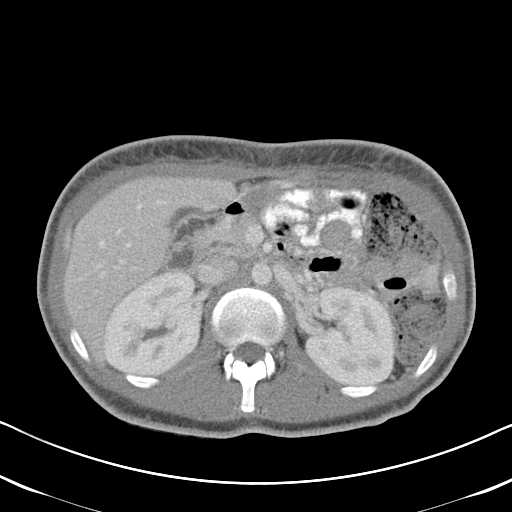
[im 71/88  soft-tissue]
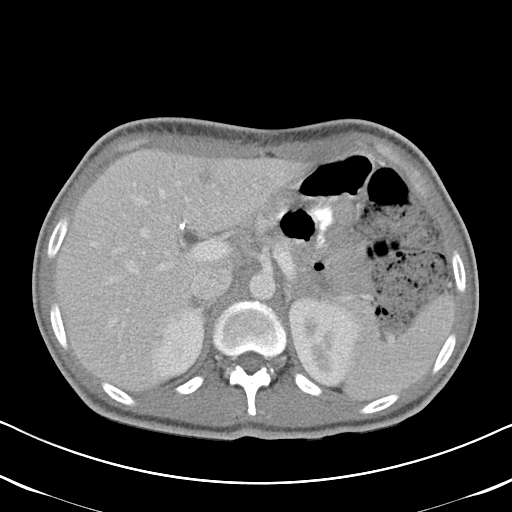
[im 77/88  soft-tissue]
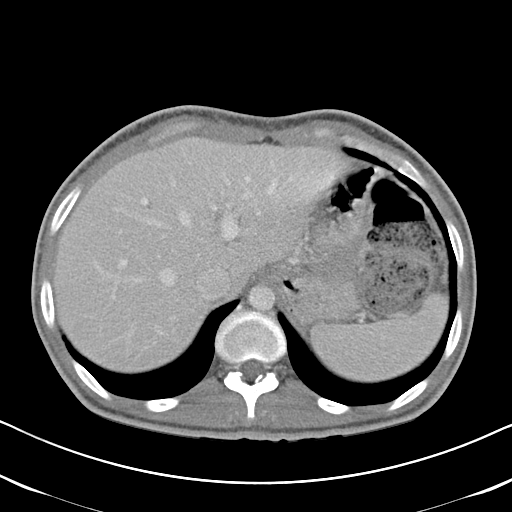
[im 84/88  soft-tissue]
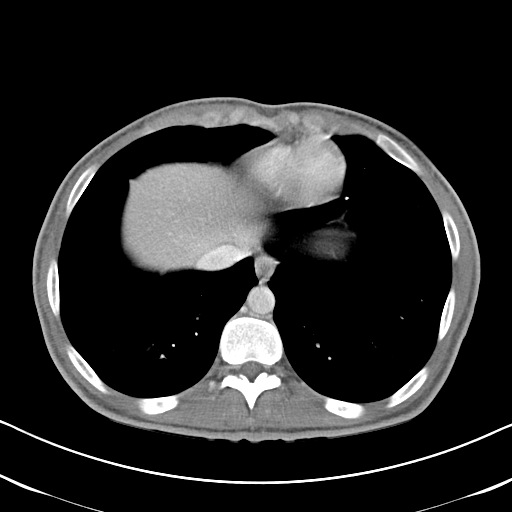

[Series 602: <mpr thick range> · coronal · 0.85mm/px · 3 of 75 slices shown]
[im 25/75  soft-tissue]
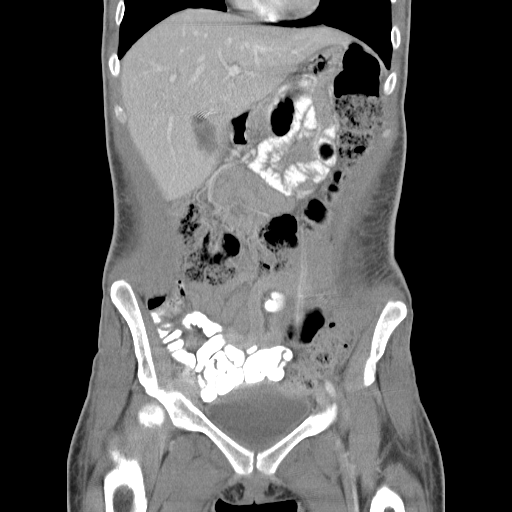
[im 33/75  soft-tissue]
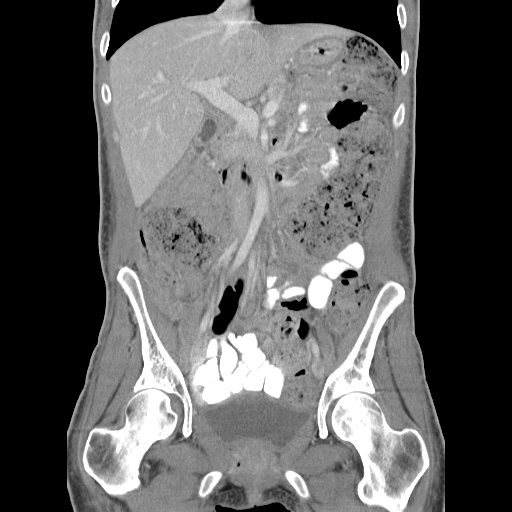
[im 42/75  soft-tissue]
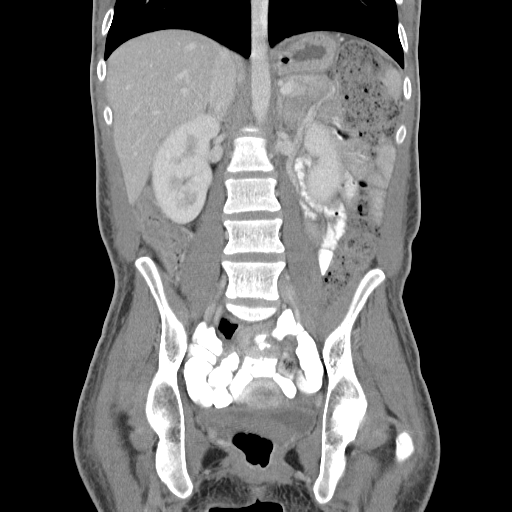

[17 of 46 positions shown; findings below may reference images not displayed]

FINDINGS: Lung Bases: Unremarkable.

Abdomen/Pelvis:  Status post cholecystectomy.  The enhanced
appearance of the liver, pancreas, spleen, bilateral adrenal glands
and bilateral kidneys is unremarkable.  No significant volume of
ascites.  No gross evidence of pneumoperitoneum.  No pathologic
distension of bowel.  No definite pathologic lymphadenopathy
identified on today's examination.  Large volume of well formed
stool throughout the colon may suggest some degree of constipation.
There is some paraumbilical soft tissue thickening and stranding,
which could simply represent normal healing at site of prior
laparoscopy port placement, or could be indicative of mild
cellulitis. Mild diffuse body wall edema is similar to the prior
examination.  Uterus and bilateral ovaries are unremarkable in
appearance. Urinary bladder is unremarkable.

Musculoskeletal: There are no aggressive appearing lytic or blastic
lesions noted in the visualized portions of the skeleton.
IMPRESSION: 1.  There is a small amount of soft tissue stranding in the region
around the umbilicus which could simply reflect normal healing at
site of prior laparoscopy port placement, however, clinical
correlation for signs and symptoms of cellulitis in this region is
recommended.
2.  Status post cholecystectomy.
3.  No acute findings in the abdomen or pelvis to account for
patient's symptoms.
4.  Mild diffuse body wall edema is again noted.

## 2011-12-06 MED ORDER — VITAMINS A & D EX OINT
TOPICAL_OINTMENT | CUTANEOUS | Status: AC
  Filled 2011-12-06: qty 5

## 2011-12-06 MED ORDER — IOHEXOL 300 MG/ML  SOLN
100.0000 mL | Freq: Once | INTRAMUSCULAR | Status: AC | PRN
  Administered 2011-12-06: 100 mL via INTRAVENOUS

## 2011-12-06 NOTE — Progress Notes (Signed)
3 Days Post-Op  Subjective: Complains of severe epigastric pain. This is the same as it has been  Objective: Vital signs in last 24 hours: Temp:  [98 F (36.7 C)-99 F (37.2 C)] 99 F (37.2 C) (09/07 0613) Pulse Rate:  [119-134] 134  (09/07 0613) Resp:  [18-21] 18  (09/07 0613) BP: (109-125)/(73-89) 125/77 mmHg (09/07 0613) SpO2:  [98 %-100 %] 99 % (09/07 0613) Last BM Date: 11/29/11  Intake/Output from previous day: 09/06 0701 - 09/07 0700 In: 120 [P.O.:120] Out: -  Intake/Output this shift:    GI: soft, flat. very tender but no guarding. good bs  Lab Results:   Basename 12/04/11 0948  WBC 3.4*  HGB 12.3  HCT 35.2*  PLT 367   BMET  Basename 12/04/11 0948  NA 136  K 3.8  CL 98  CO2 25  GLUCOSE 193*  BUN 11  CREATININE 0.34*  CALCIUM 9.5   PT/INR No results found for this basename: LABPROT:2,INR:2 in the last 72 hours ABG No results found for this basename: PHART:2,PCO2:2,PO2:2,HCO3:2 in the last 72 hours  Studies/Results: Ct Angio Chest Pe W/cm &/or Wo Cm  12/04/2011  *RADIOLOGY REPORT*  Clinical Data: Chest pain and shortness of breath.  Elevated D- dimer.  Tachycardia.  CT ANGIOGRAPHY CHEST  Technique:  Multidetector CT imaging of the chest using the standard protocol during bolus administration of intravenous contrast. Multiplanar reconstructed images including MIPs were obtained and reviewed to evaluate the vascular anatomy.  Contrast: 59m OMNIPAQUE IOHEXOL 350 MG/ML SOLN  Comparison: None.  Findings: Satisfactory opacification of the pulmonary arteries noted, and there is no evidence of pulmonary emboli.  No evidence of thoracic aortic aneurysm or dissection.  No evidence of mediastinal hematoma or mass.  No evidence of thoracic lymphadenopathy.  No evidence of pleural or pericardial effusion.  Both lungs are clear.  No central endobronchial lesion identified.  IMPRESSION:  Negative.  No evidence of pulmonary embolism or other active disease within the thorax.    Original Report Authenticated By: JMarlaine Hind M.D.    Nm Gastric Emptying  12/05/2011  *RADIOLOGY REPORT*  Clinical Data:  Epigastric pain, diabetes  NUCLEAR MEDICINE GASTRIC EMPTYING SCAN  Technique:  After oral ingestion of radiolabeled meal, sequential abdominal images were obtained for 120 minutes.  Residual percentage of activity remaining within the stomach was calculated at 60 and 120 minutes.  Radiopharmaceutical: 2.1 mCi Tc-932mulfur colloid.  Comparison: None.  Findings:Left anerior oblique imaging after the patient ingested the radiopharmaceutical in scrambled egg p.o. There is 91% residual gastric activity at 120 minutes (normal less than 30%).  IMPRESSION  Gastric emptying is delayed.   Original Report Authenticated By: D.Trecia RogersM.D.     Anti-infectives: Anti-infectives     Start     Dose/Rate Route Frequency Ordered Stop   12/05/11 1300   erythromycin 250 mg in sodium chloride 0.9 % 100 mL IVPB        250 mg 100 mL/hr over 60 Minutes Intravenous 4 times per day 12/05/11 1224     11/23/11 0800   piperacillin-tazobactam (ZOSYN) IVPB 3.375 g  Status:  Discontinued        3.375 g 12.5 mL/hr over 240 Minutes Intravenous Every 8 hours 11/23/11 0341 11/24/11 1213   11/22/11 2230   piperacillin-tazobactam (ZOSYN) IVPB 3.375 g        3.375 g 100 mL/hr over 30 Minutes Intravenous  Once 11/22/11 2205 11/22/11 2326  Assessment/Plan: s/p Procedure(s) (LRB) with comments: ESOPHAGOGASTRODUODENOSCOPY (EGD) (N/A) Pain is out of proportion to her physical findings. Will plan to repeat labs and get CT of abd pelvis  LOS: 14 days    TOTH III,Kyleen Villatoro S 12/06/2011

## 2011-12-06 NOTE — Progress Notes (Signed)
I am covering this patient today for Drs. Adriana Mccallum.   Since last GI note: She is worried about possibly not being able to go home today, CT scan ordered for severe pains.  Sitting up in chair drinking contrast.  Objective: Vital signs in last 24 hours: Temp:  [98 F (36.7 C)-99 F (37.2 C)] 99 F (37.2 C) (09/07 0613) Pulse Rate:  [119-134] 134  (09/07 0613) Resp:  [18-21] 18  (09/07 0613) BP: (109-125)/(73-89) 125/77 mmHg (09/07 0613) SpO2:  [98 %-100 %] 99 % (09/07 0613) Last BM Date: 11/29/11 General: alert and oriented times 3 Heart: regular rate and rythm Abdomen: soft, non-tender, non-distended, normal bowel sounds   Lab Results:  Basename 12/04/11 0948  WBC 3.4*  HGB 12.3  PLT 367  MCV 87.1    Basename 12/04/11 0948  NA 136  K 3.8  CL 98  CO2 25  GLUCOSE 193*  BUN 11  CREATININE 0.34*  CALCIUM 9.5    Basename 12/04/11 0948  PROT 7.2  ALBUMIN 3.6  AST 16  ALT 15  ALKPHOS 56  BILITOT 0.6  BILIDIR --  IBILI --  Medications: Scheduled Meds:   . acetaminophen  1,000 mg Oral TID  . aspirin  81 mg Oral Daily  . enalapril  10 mg Oral QHS  . erythromycin  250 mg Intravenous Q6H  . heparin  5,000 Units Subcutaneous Q8H  . insulin aspart  0-9 Units Subcutaneous Q4H  . insulin glargine  15 Units Subcutaneous QHS  . lip balm  1 application Topical BID  . metoCLOPramide (REGLAN) injection  10 mg Intravenous Q6H  . multivitamin with minerals  1 tablet Oral Daily  . pantoprazole  40 mg Oral Q1200  . polyethylene glycol  17 g Oral Daily  . vitamin A & D      . DISCONTD: citalopram  20 mg Oral Daily  . DISCONTD: feeding supplement  1 Container Oral TID BM   Continuous Infusions:   . sodium chloride 50 mL/hr at 12/01/11 2256  . sodium chloride 20 mL/hr at 12/03/11 1800   PRN Meds:.bisacodyl, docusate sodium, hydrALAZINE, HYDROcodone-acetaminophen, magic mouthwash, menthol-cetylpyridinium, ondansetron (ZOFRAN) IV, ondansetron, phenol, promethazine,  technetium sulfur colloid, DISCONTD: metoprolol, DISCONTD:  morphine injection, DISCONTD: oxyCODONE    Assessment/Plan: 21 y.o. female with abd pains, probably related to significant gastroparesis  She really wants to go home today and I think that is safe as long as no serious findings on CT.  She will follow up with Drs. Adriana Mccallum as previously scheduled.    Owens Loffler, MD  12/06/2011, 12:20 PM [facility] Pager [phone]

## 2011-12-06 NOTE — Consult Note (Signed)
Medical Consultation  [name] [employer]:[id number] DOB: 04/07/1990 DOA: 11/22/2011  Referring physician: Greer Pickerel, MD PCP: [name], NP   Reason for consultation: chest pain, IDDM  Impression/Recommendations 1. Chest pain--resolved, secondary to gastroparesis. Cardiac enzymes negative, CTA chest negative, EKG SR, repolarization abnormality. Venous dopplers negative.  2. Gastroparesis--Management deferred to GI. Small, frequent meals. 3. IDDM--stable. Hgb A1c 6.1. Given numbers for endocrinologist, interested in insulin pump as outpatient. Continue Lantus at 1/2 dose, resume meal coverage, SSI as outpatient. 4. HTN--stable on enalapril. 5. Severe malnutrition in context of chronic illness--resource breeze TID. Patient meets malnutrition criteria due to 14 lb weight loss from baseline and PO intake reported meets < 75% of estimated energy requirements over 1 month. (per nutrition) 6. Suspected anxiety and depression--not on any outpatient medications for this, psychiatry recommended outpatient counseling, no medications.  Patient is eager to go home. Medical issues appear stable. I will sign off. Please call me if I can be of further assistance.  [name], MD  [facility] Team 5 Pager [phone] If 7PM-7AM, please contact night-coverage [email or web address] Password [email or web address]  S:  Feels ok, no chest pain, abdominal pain persists. Constipated.  Physical Exam:  Filed Vitals:   12/05/11 0527 12/05/11 1428 12/05/11 2151 12/06/11 0613  BP: 124/86 109/73 124/89 125/77  Pulse: 108 124 119 134  Temp: 98.4 F (36.9 C) 98 F (36.7 C) 98.3 F (36.8 C) 99 F (37.2 C)  TempSrc: Oral Oral Oral Oral  Resp: 18 21 19 18   Height:      Weight:      SpO2: 98% 98% 100% 99%    General:  Appears anxious. Non-toxic.  Cardiovascular: tachycardic, RR, no m/r/g. No LE edema.  Respiratory: CTA bilaterally, no w/r/r. Normal respiratory effort.  Abdomen: soft, ntnd  Labs on Admission:    Basic Metabolic Panel:  Lab 06/00/45 0948 12/02/11 0430  NA 136 139  K 3.8 3.9  CL 98 101  CO2 25 32  GLUCOSE 193* 114*  BUN 11 11  CREATININE 0.34* 0.48*  CALCIUM 9.5 9.3  MG -- --  PHOS -- --   Liver Function Tests:  Lab 12/04/11 0948 12/02/11 0430  AST 16 10  ALT 15 7  ALKPHOS 56 52  BILITOT 0.6 0.5  PROT 7.2 6.5  ALBUMIN 3.6 3.2*   CBC:  Lab 12/04/11 0948 12/02/11 0430  WBC 3.4* 3.7*  NEUTROABS -- --  HGB 12.3 11.0*  HCT 35.2* 31.4*  MCV 87.1 87.7  PLT 367 344   CBG:  Lab 12/06/11 0806 12/06/11 0357 12/06/11 0004 12/05/11 2011 12/05/11 1615  GLUCAP 176* 141* 159* 131* 328*    Radiological Exams on Admission:  Time 15 minutes  Evaluated by PT--no needs Evaluated by OT--no needs   [name], MD  [facility] Pager [phone]  If 7PM-7AM, please contact night-coverage [email or web address] Password [email or web address] 12/06/2011, 11:08 AM

## 2011-12-08 LAB — GLUCOSE, CAPILLARY: Glucose-Capillary: 155 mg/dL — ABNORMAL HIGH (ref 70–99)

## 2011-12-10 ENCOUNTER — Encounter (INDEPENDENT_AMBULATORY_CARE_PROVIDER_SITE_OTHER): Payer: 59 | Admitting: Surgery

## 2011-12-12 ENCOUNTER — Encounter (HOSPITAL_COMMUNITY): Payer: Self-pay | Admitting: Family Medicine

## 2011-12-12 ENCOUNTER — Emergency Department (HOSPITAL_COMMUNITY)
Admission: EM | Admit: 2011-12-12 | Discharge: 2011-12-12 | Disposition: A | Discharge status: Home / Self Care | Payer: 59 | Attending: Emergency Medicine | Admitting: Emergency Medicine

## 2011-12-12 DIAGNOSIS — E1065 Type 1 diabetes mellitus with hyperglycemia: Secondary | ICD-10-CM | POA: Insufficient documentation

## 2011-12-12 DIAGNOSIS — Z833 Family history of diabetes mellitus: Secondary | ICD-10-CM | POA: Insufficient documentation

## 2011-12-12 DIAGNOSIS — Z8249 Family history of ischemic heart disease and other diseases of the circulatory system: Secondary | ICD-10-CM | POA: Insufficient documentation

## 2011-12-12 DIAGNOSIS — IMO0002 Reserved for concepts with insufficient information to code with codable children: Secondary | ICD-10-CM | POA: Insufficient documentation

## 2011-12-12 DIAGNOSIS — Z9109 Other allergy status, other than to drugs and biological substances: Secondary | ICD-10-CM | POA: Insufficient documentation

## 2011-12-12 DIAGNOSIS — R109 Unspecified abdominal pain: Secondary | ICD-10-CM | POA: Insufficient documentation

## 2011-12-12 DIAGNOSIS — G8929 Other chronic pain: Secondary | ICD-10-CM | POA: Insufficient documentation

## 2011-12-12 DIAGNOSIS — Z794 Long term (current) use of insulin: Secondary | ICD-10-CM | POA: Insufficient documentation

## 2011-12-12 DIAGNOSIS — I10 Essential (primary) hypertension: Secondary | ICD-10-CM | POA: Insufficient documentation

## 2011-12-12 DIAGNOSIS — N390 Urinary tract infection, site not specified: Secondary | ICD-10-CM | POA: Insufficient documentation

## 2011-12-12 LAB — CBC WITH DIFFERENTIAL/PLATELET
Basophils Absolute: 0 10*3/uL (ref 0.0–0.1)
Basophils Relative: 1 % (ref 0–1)
Eosinophils Absolute: 0.1 10*3/uL (ref 0.0–0.7)
Eosinophils Relative: 2 % (ref 0–5)
HCT: 34.2 % — ABNORMAL LOW (ref 36.0–46.0)
Hemoglobin: 12.1 g/dL (ref 12.0–15.0)
Lymphocytes Relative: 51 % — ABNORMAL HIGH (ref 12–46)
Lymphs Abs: 2.1 10*3/uL (ref 0.7–4.0)
MCH: 31 pg (ref 26.0–34.0)
MCHC: 35.4 g/dL (ref 30.0–36.0)
MCV: 87.7 fL (ref 78.0–100.0)
Monocytes Absolute: 0.2 10*3/uL (ref 0.1–1.0)
Monocytes Relative: 5 % (ref 3–12)
Neutro Abs: 1.7 10*3/uL (ref 1.7–7.7)
Neutrophils Relative %: 41 % — ABNORMAL LOW (ref 43–77)
Platelets: 312 10*3/uL (ref 150–400)
RBC: 3.9 MIL/uL (ref 3.87–5.11)
RDW: 12.5 % (ref 11.5–15.5)
WBC: 4.2 10*3/uL (ref 4.0–10.5)

## 2011-12-12 LAB — URINALYSIS, ROUTINE W REFLEX MICROSCOPIC
Bilirubin Urine: NEGATIVE
Glucose, UA: >1000 mg/dL — AB
Hgb urine dipstick: NEGATIVE
Ketones, ur: NEGATIVE mg/dL
Leukocytes, UA: NEGATIVE
Nitrite: NEGATIVE
Protein, ur: 100 mg/dL — AB
Specific Gravity, Urine: 1.026 (ref 1.005–1.030)
Urobilinogen, UA: 0.2 mg/dL (ref 0.0–1.0)
pH: 6.5 (ref 5.0–8.0)

## 2011-12-12 LAB — COMPREHENSIVE METABOLIC PANEL
ALT: 24 U/L (ref 0–35)
AST: 24 U/L (ref 0–37)
Albumin: 3.7 g/dL (ref 3.5–5.2)
Alkaline Phosphatase: 66 U/L (ref 39–117)
BUN: 9 mg/dL (ref 6–23)
CO2: 29 mEq/L (ref 19–32)
Calcium: 9.6 mg/dL (ref 8.4–10.5)
Chloride: 98 mEq/L (ref 96–112)
Creatinine, Ser: 0.43 mg/dL — ABNORMAL LOW (ref 0.50–1.10)
GFR calc Af Amer: >90 mL/min (ref >90)
GFR calc non Af Amer: >90 mL/min (ref >90)
Glucose, Bld: 318 mg/dL — ABNORMAL HIGH (ref 70–99)
Potassium: 3.9 mEq/L (ref 3.5–5.1)
Sodium: 134 mEq/L — ABNORMAL LOW (ref 135–145)
Total Bilirubin: 0.3 mg/dL (ref 0.3–1.2)
Total Protein: 7.4 g/dL (ref 6.0–8.3)

## 2011-12-12 LAB — URINE MICROSCOPIC-ADD ON

## 2011-12-12 LAB — PREGNANCY, URINE: Preg Test, Ur: NEGATIVE

## 2011-12-12 LAB — LIPASE, BLOOD: Lipase: 74 U/L — ABNORMAL HIGH (ref 11–59)

## 2011-12-12 MED ORDER — SODIUM CHLORIDE 0.9 % IV BOLUS (SEPSIS)
1000.0000 mL | Freq: Once | INTRAVENOUS | Status: AC
  Administered 2011-12-12: 1000 mL via INTRAVENOUS

## 2011-12-12 MED ORDER — DEXTROSE 5 % IV SOLN
1.0000 g | Freq: Once | INTRAVENOUS | Status: AC
  Administered 2011-12-12: 1 g via INTRAVENOUS
  Filled 2011-12-12: qty 10

## 2011-12-12 MED ORDER — HYDROMORPHONE HCL PF 1 MG/ML IJ SOLN
1.0000 mg | Freq: Once | INTRAMUSCULAR | Status: AC
  Administered 2011-12-12: 1 mg via INTRAVENOUS
  Filled 2011-12-12: qty 1

## 2011-12-12 MED ORDER — METOCLOPRAMIDE HCL 5 MG/ML IJ SOLN
10.0000 mg | Freq: Once | INTRAMUSCULAR | Status: AC
  Administered 2011-12-12: 10 mg via INTRAVENOUS
  Filled 2011-12-12: qty 2

## 2011-12-12 MED ORDER — CIPROFLOXACIN HCL 500 MG PO TABS: 500.0000 mg | ORAL_TABLET | Freq: Two times a day (BID) | ORAL | Status: DC

## 2011-12-12 MED ORDER — CIPROFLOXACIN HCL 500 MG PO TABS: 500.0000 mg | ORAL_TABLET | Freq: Two times a day (BID) | ORAL | Status: AC

## 2011-12-12 NOTE — ED Notes (Signed)
Patient states that she started having abdominal pain around 6pm. States pain started after eating. Had endoscopy last week. Reports one episode of vomiting.

## 2011-12-12 NOTE — ED Notes (Signed)
Pt discharged before morning shift rn took assignment.

## 2011-12-13 ENCOUNTER — Emergency Department (HOSPITAL_COMMUNITY): Payer: 59

## 2011-12-13 ENCOUNTER — Emergency Department (HOSPITAL_COMMUNITY)
Admission: EM | Admit: 2011-12-13 | Discharge: 2011-12-13 | Disposition: A | Discharge status: Home / Self Care | Payer: 59 | Attending: Emergency Medicine | Admitting: Emergency Medicine

## 2011-12-13 DIAGNOSIS — Z794 Long term (current) use of insulin: Secondary | ICD-10-CM | POA: Insufficient documentation

## 2011-12-13 DIAGNOSIS — M25562 Pain in left knee: Secondary | ICD-10-CM

## 2011-12-13 DIAGNOSIS — E109 Type 1 diabetes mellitus without complications: Secondary | ICD-10-CM | POA: Insufficient documentation

## 2011-12-13 DIAGNOSIS — M25572 Pain in left ankle and joints of left foot: Secondary | ICD-10-CM

## 2011-12-13 DIAGNOSIS — I10 Essential (primary) hypertension: Secondary | ICD-10-CM | POA: Insufficient documentation

## 2011-12-13 DIAGNOSIS — M25579 Pain in unspecified ankle and joints of unspecified foot: Secondary | ICD-10-CM | POA: Insufficient documentation

## 2011-12-13 DIAGNOSIS — M25569 Pain in unspecified knee: Secondary | ICD-10-CM | POA: Insufficient documentation

## 2011-12-13 DIAGNOSIS — Z79899 Other long term (current) drug therapy: Secondary | ICD-10-CM | POA: Insufficient documentation

## 2011-12-13 LAB — URINE CULTURE
Colony Count: NO GROWTH
Culture: NO GROWTH

## 2011-12-13 IMAGING — CR DG ANKLE COMPLETE 3+V*L*
3 series · 3 of 3 positions shown · non-contrast
Comparison: None.

CLINICAL DATA: Fell and injured left ankle.

LEFT ANKLE COMPLETE - 3+ VIEW

[x ankle ap left]
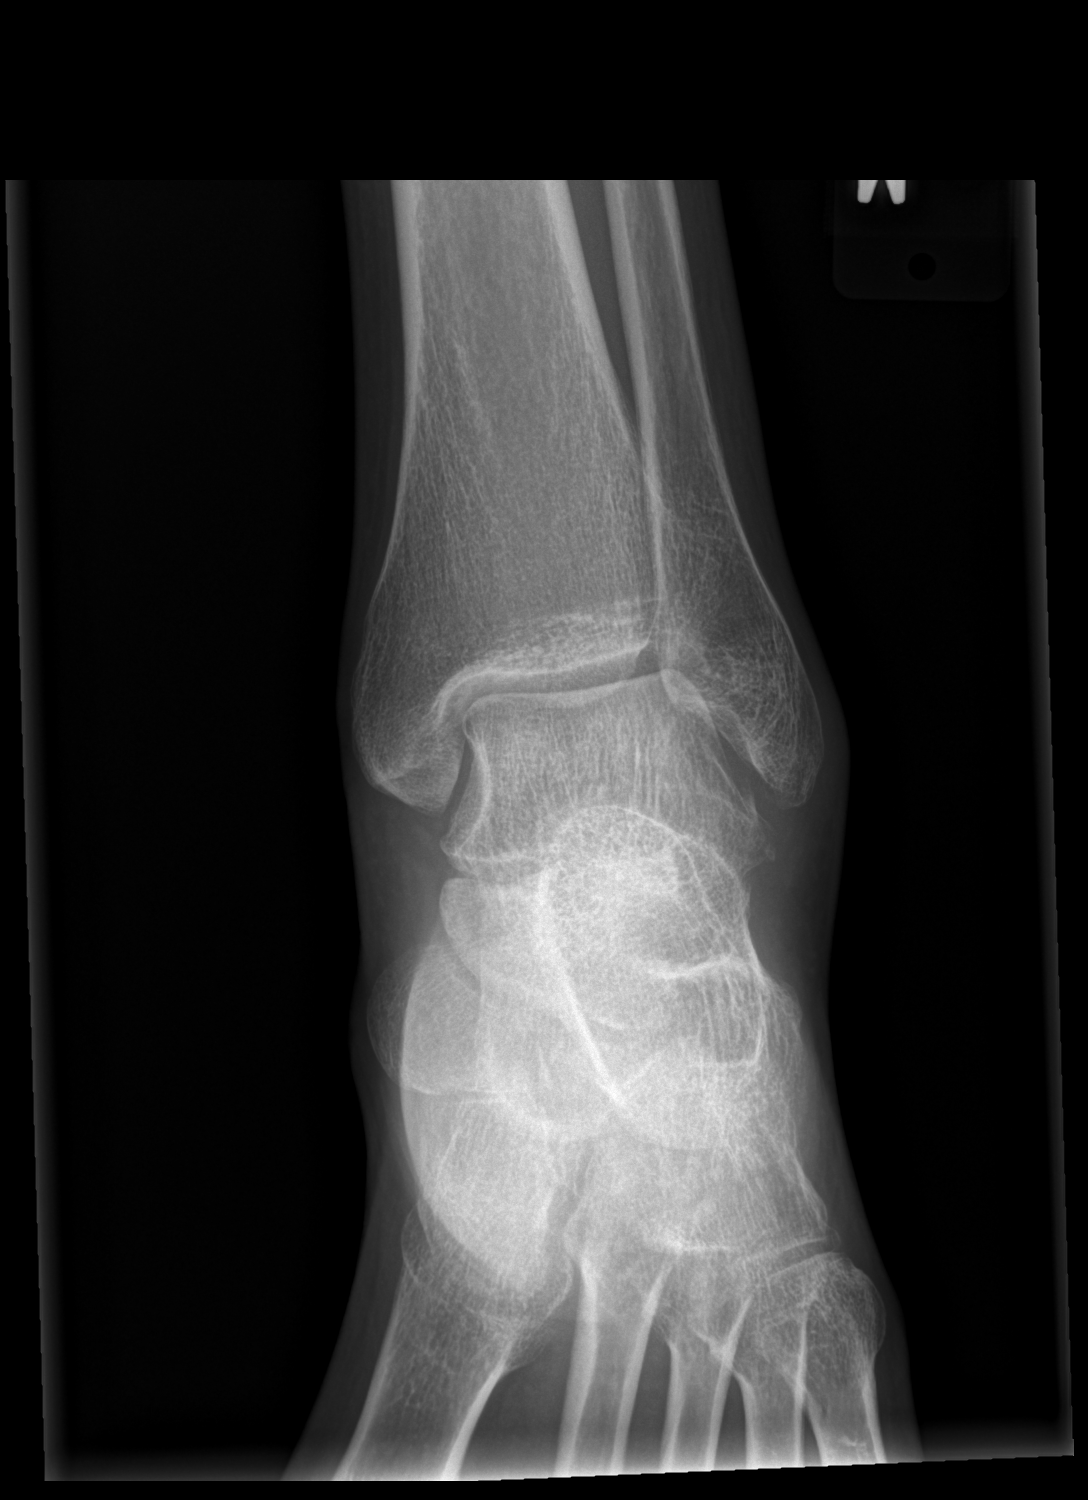

[x ankle obl left]
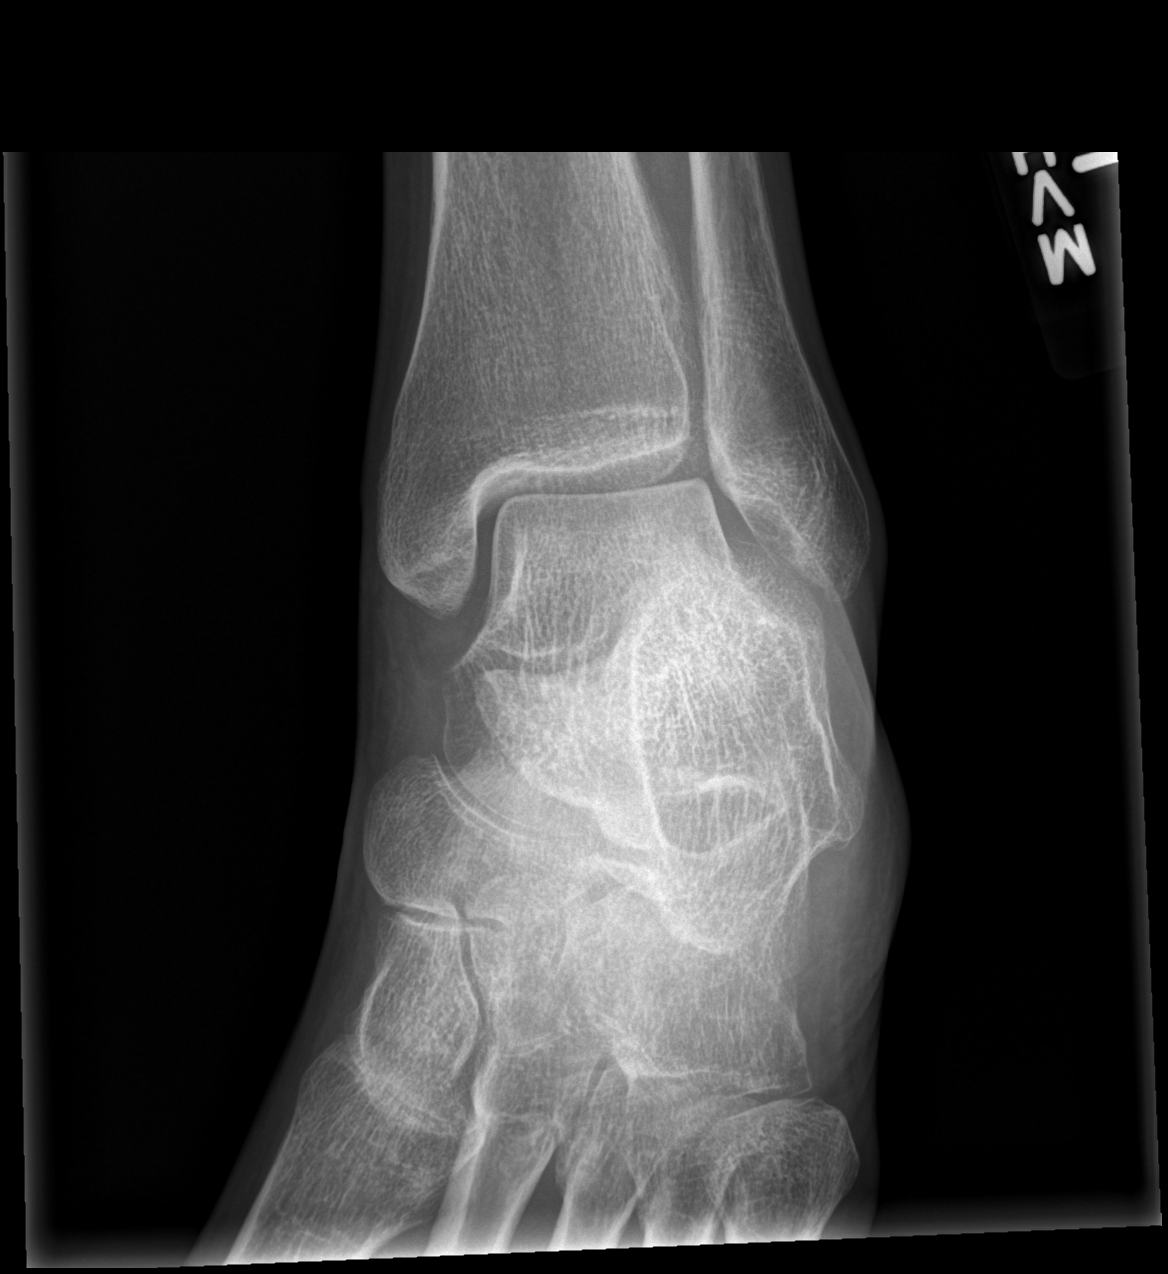

[x ankle lat left]
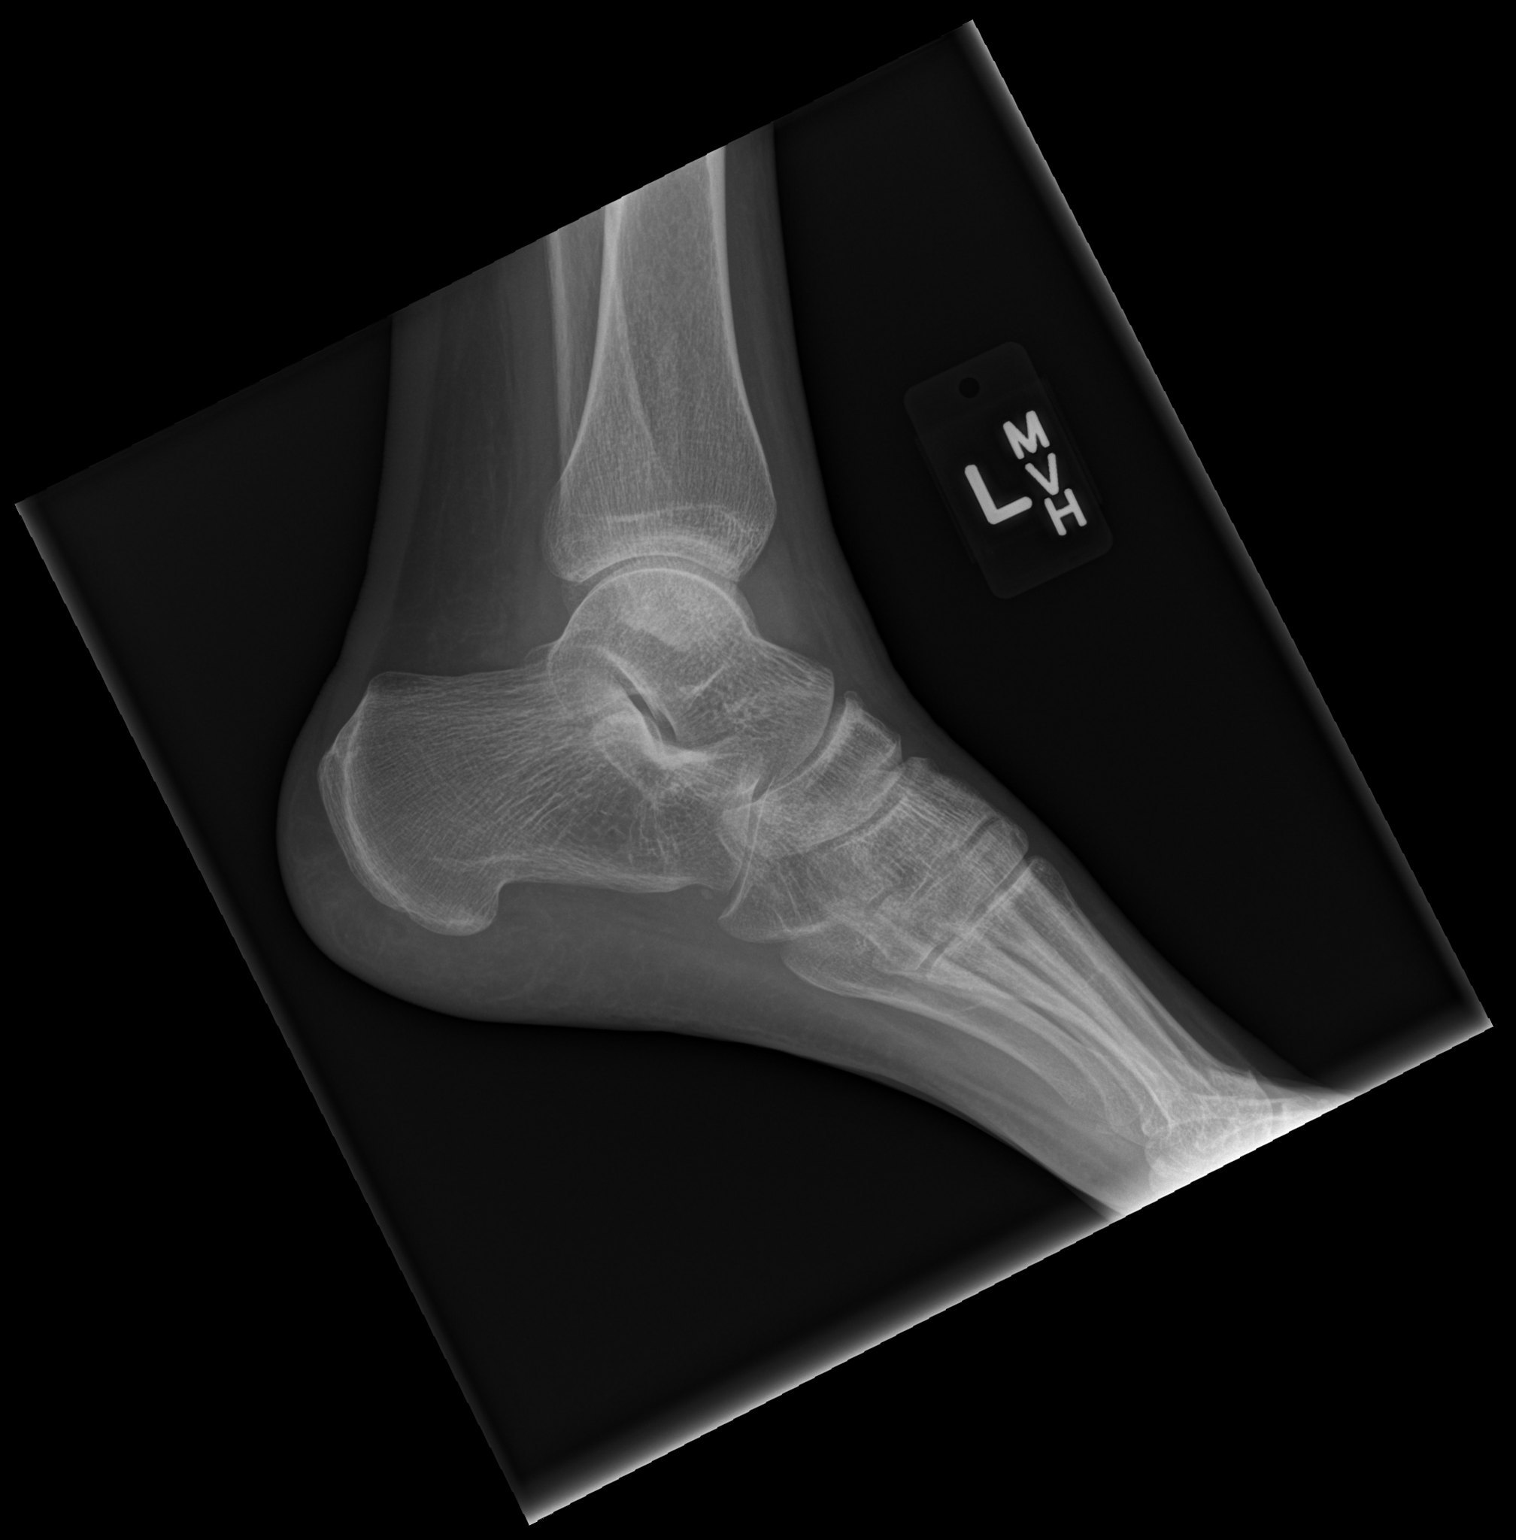

[3 of 3 positions shown; findings below may reference images not displayed]

FINDINGS: No evidence of acute fracture or dislocation.  Ankle
mortise intact with well-preserved joint space.  Moderate sized
joint effusion/hemarthrosis.
IMPRESSION: No osseous abnormality.

## 2011-12-13 IMAGING — CR DG KNEE COMPLETE 4+V*L*
4 series · 4 of 4 positions shown · non-contrast
Comparison: None.

CLINICAL DATA: Fell and injured left knee.  Generalized pain.

LEFT KNEE - COMPLETE 4+ VIEW

[t knee ap left]
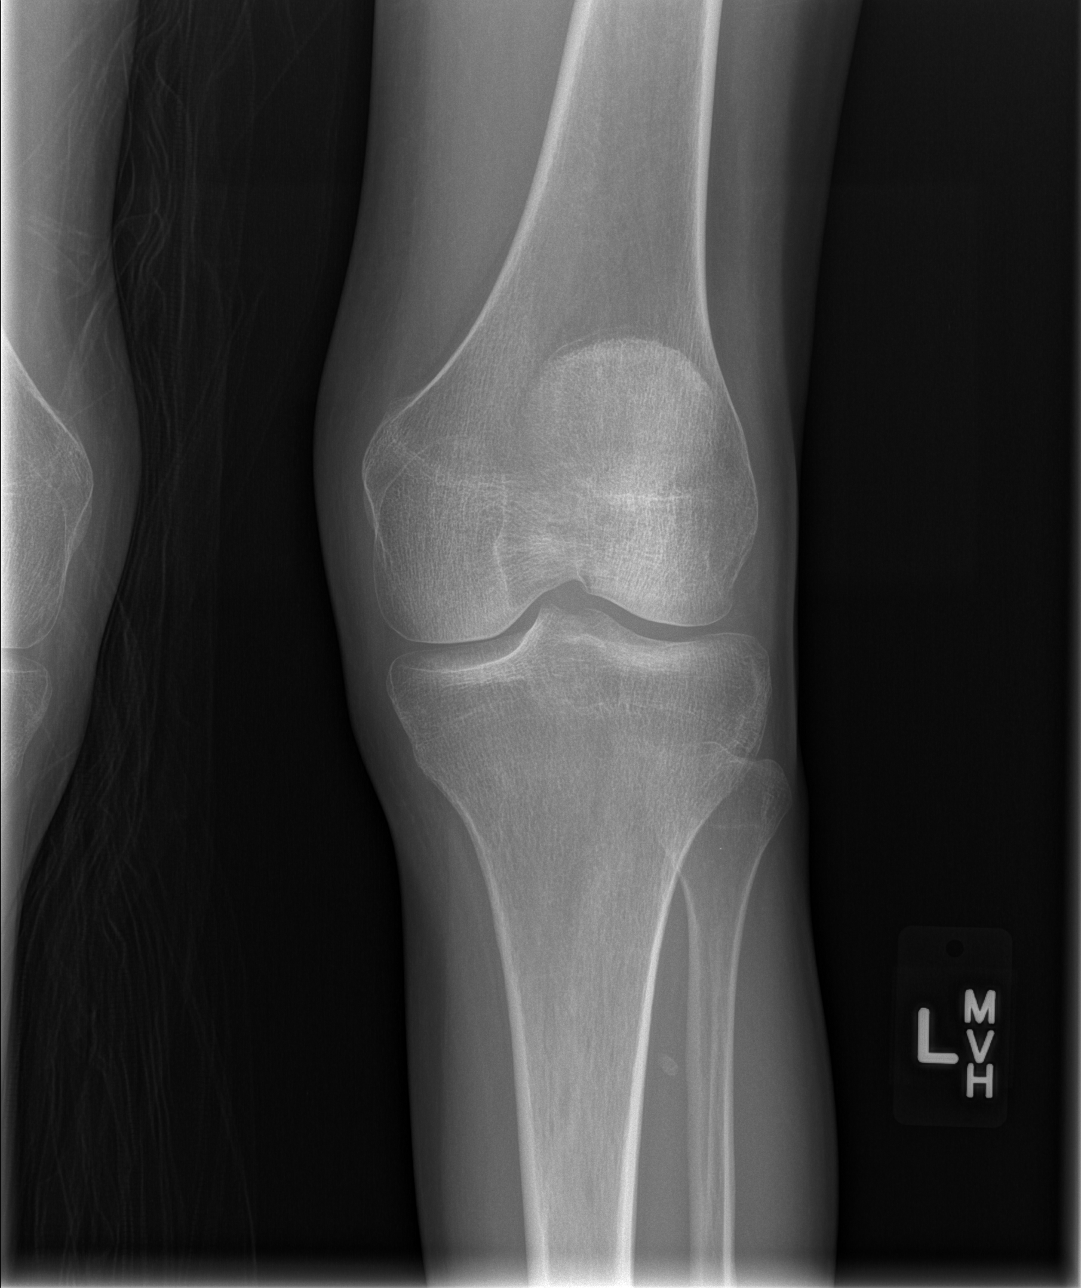

[t knee obl left (1 of 2)]
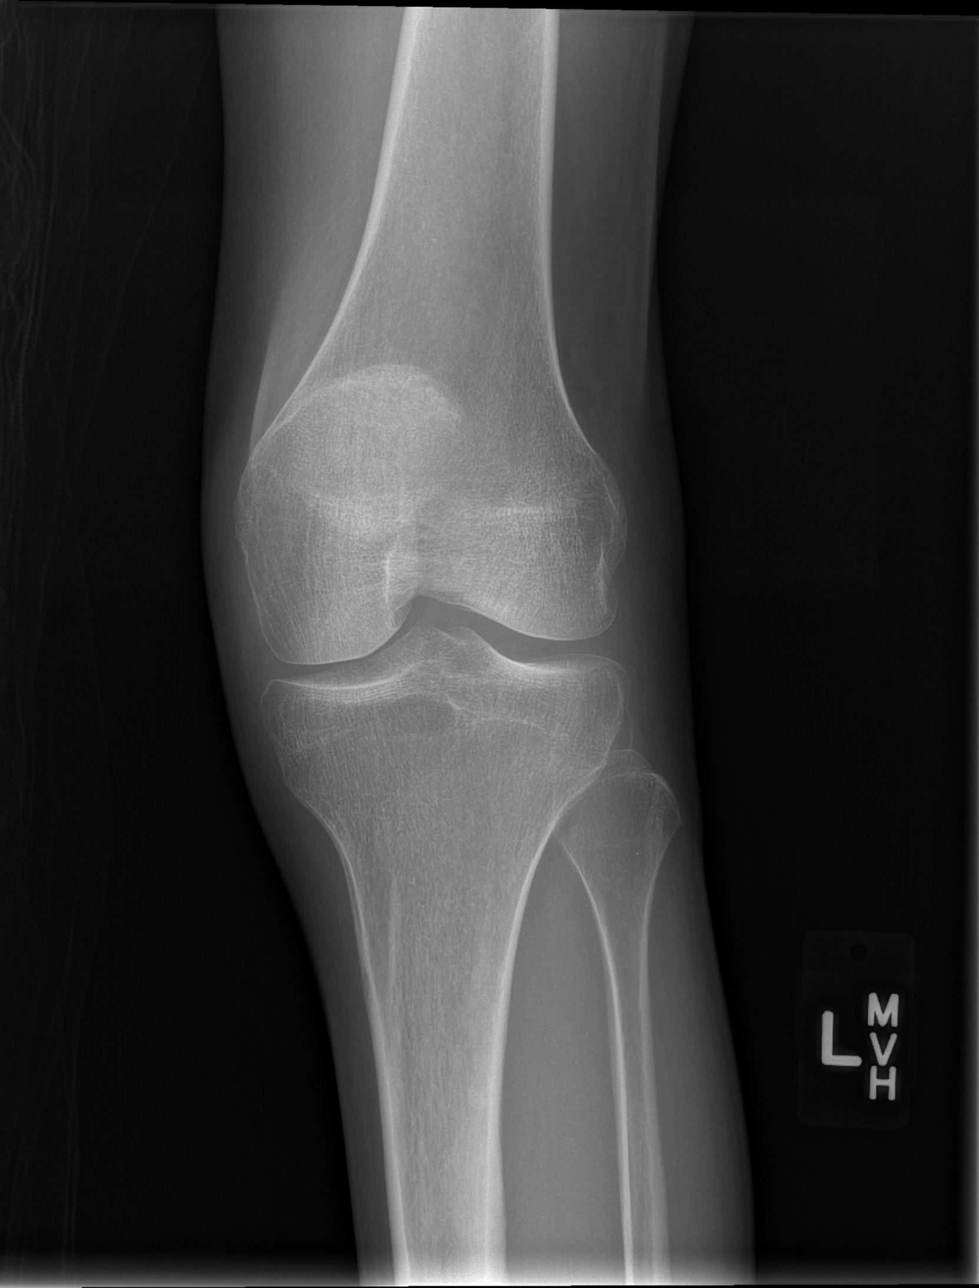

[t knee obl left (2 of 2)]
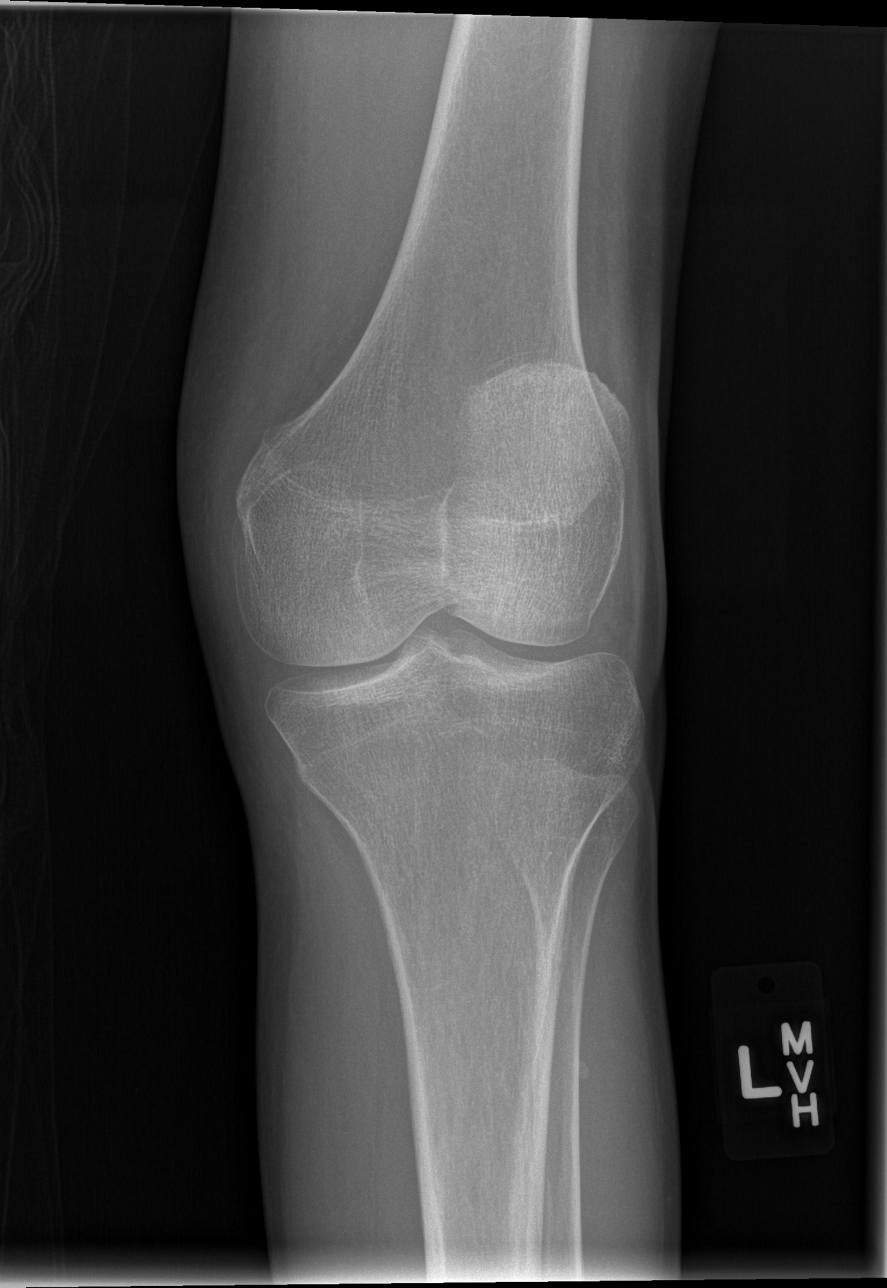

[t knee lat left]
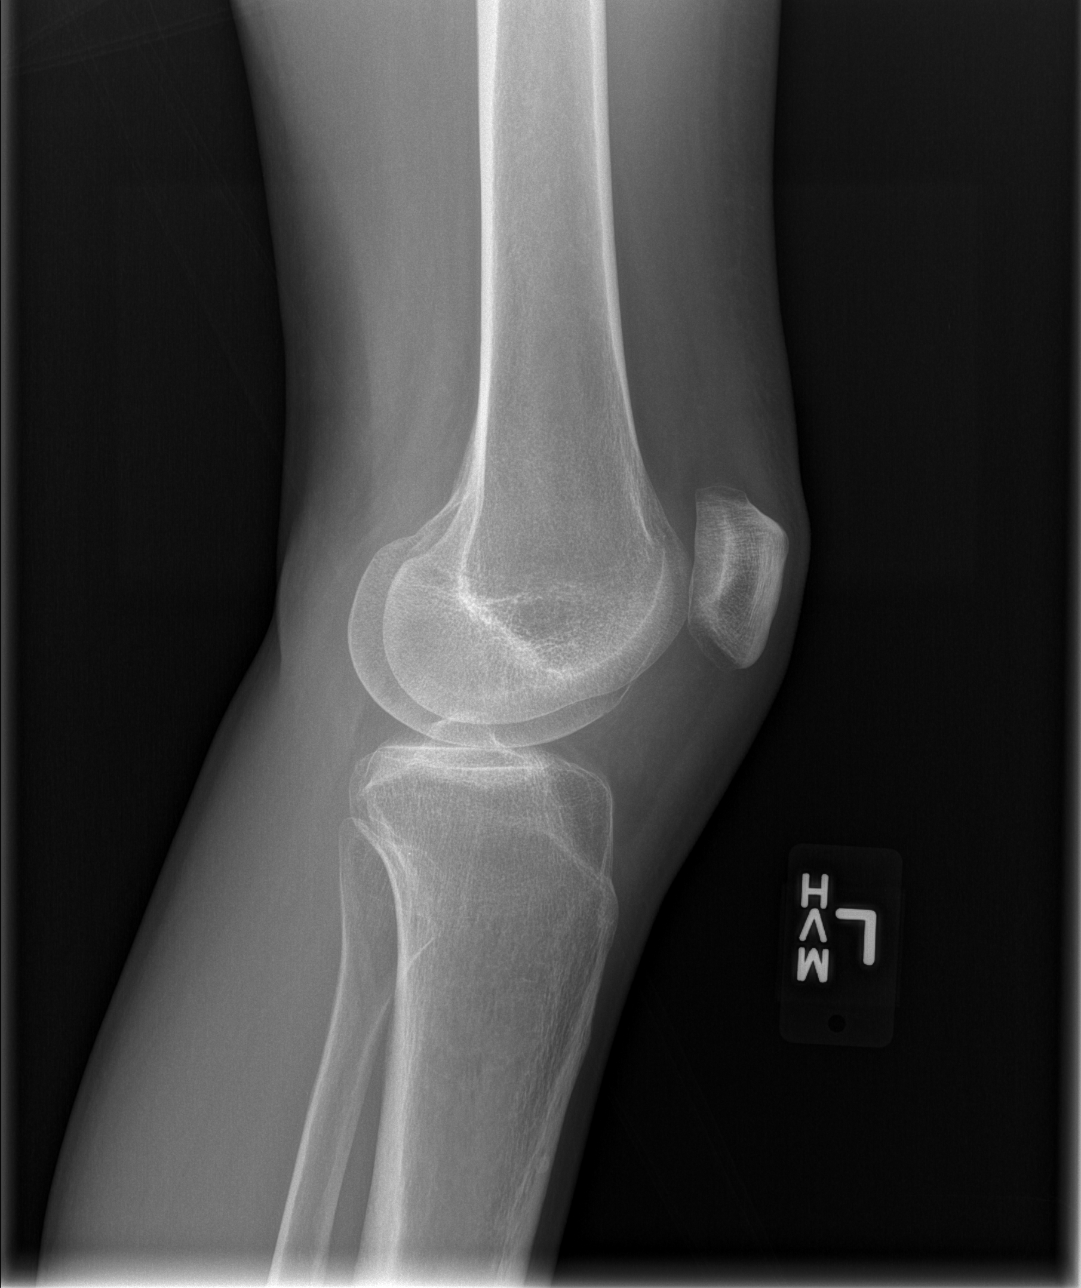

[4 of 4 positions shown; findings below may reference images not displayed]

FINDINGS: No evidence of acute, subacute, or healed fractures.
Well-preserved joint spaces.  No intrinsic osseous abnormalities.
No evidence of a significant joint effusion.  Note made of a
phlebolith in the soft tissues of the proximal left calf.
IMPRESSION: No osseous abnormality.

## 2011-12-13 MED ORDER — OXYCODONE-ACETAMINOPHEN 5-325 MG PO TABS
1.0000 | ORAL_TABLET | Freq: Once | ORAL | Status: AC
  Administered 2011-12-13: 1 via ORAL
  Filled 2011-12-13: qty 1

## 2011-12-13 MED ORDER — TRAMADOL HCL 50 MG PO TABS: 50.0000 mg | ORAL_TABLET | Freq: Four times a day (QID) | ORAL | Status: AC | PRN

## 2011-12-13 NOTE — ED Notes (Signed)
Pt sts she fell down stairs landing on left leg. Pt c/o left leg and ankle pain. No LOC/N/V. VSS. pwd

## 2011-12-13 NOTE — ED Provider Notes (Signed)
History     CSN: [id number]  Arrival date & time 12/13/11  1225   First MD Initiated Contact with Patient 12/13/11 1238      Chief Complaint  Patient presents with  . Fall    (Consider location/radiation/quality/duration/timing/severity/associated sxs/prior treatment) HPI Hx from pt. [name] is a 21 y.o. female presenting after fall. She reports that she was walking down the stairs when she slipped and fell down approximately half a flight of stairs. She reports that her left leg became tucked underneath her. She was able to weight-bear and walk afterwards but reports continued pain to her left leg and ankle. She denies any numbness or weakness in the extremity. She has not noticed any deformity to the areas. She has not taken any medication for this prior to arrival. She denies hitting her head or losing consciousness.  Past Medical History  Diagnosis Date  . Diabetes mellitus   . Hypertension   . Diabetes type 1, uncontrolled 11/14/2011    Since age 52    Past Surgical History  Procedure Date  . Ankle surgery   . Cholecystectomy 11/15/2011    Procedure: LAPAROSCOPIC CHOLECYSTECTOMY WITH INTRAOPERATIVE CHOLANGIOGRAM;  Surgeon: [name], MD;  Location: [facility];  Service: General;  Laterality: N/A;  . Laparoscopy 11/23/2011    Procedure: LAPAROSCOPY DIAGNOSTIC;  Surgeon: Mariella Saa, MD;  Location: [facility];  Service: General;  Laterality: N/A;  . Esophagogastroduodenoscopy 12/03/2011    Procedure: ESOPHAGOGASTRODUODENOSCOPY (EGD);  Surgeon: Theda Belfast, MD;  Location: [facility];  Service: Endoscopy;  Laterality: N/A;    Family History  Problem Relation Age of Onset  . Diabetes Mother   . Hypertension Father     History  Substance Use Topics  . Smoking status: Never Smoker   . Smokeless tobacco: Never Used  . Alcohol Use: Yes     Occasional    OB History    Grav Para Term Preterm Abortions TAB SAB Ect Mult Living                  Review of  Systems as per history of present illness  Allergies  Other  Home Medications   Current Outpatient Rx  Name Route Sig Dispense Refill  . ENALAPRIL MALEATE 10 MG PO TABS Oral Take 10 mg by mouth every evening.     Marland Kitchen HYDROCODONE-ACETAMINOPHEN 5-325 MG PO TABS Oral Take 1 tablet by mouth every 6 (six) hours as needed. Pain    . INSULIN ASPART 100 UNIT/ML [place] SOLN Subcutaneous Inject 0-15 Units into the skin 3 (three) times daily before meals. 5 u with each meal + sliding scale: CBG 70 - 120: 0 u: CBG 121 - 150: 2 u; CBG 151 - 200: 3 u; CBG 201 - 250: 5 u; CBG 251 - 300: 8 u;CBG 301 - 350: 11 u; CBG 351 - 400: 15 u; CBG > 400 Call MD    . INSULIN GLARGINE 100 UNIT/ML [place] SOLN Subcutaneous Inject 30 Units into the skin at bedtime.     Marland Kitchen METOCLOPRAMIDE HCL 10 MG PO TABS Oral Take 10 mg by mouth 4 (four) times daily.    . ADULT MULTIVITAMIN W/MINERALS CH Oral Take 1 tablet by mouth daily.    Marland Kitchen OMEPRAZOLE 20 MG PO CPDR Oral Take 20 mg by mouth daily.    Marland Kitchen PROMETHAZINE HCL 25 MG PO TABS Oral Take 25 mg by mouth every 6 (six) hours as needed. For nausea    .  CIPROFLOXACIN HCL 500 MG PO TABS Oral Take 1 tablet (500 mg total) by mouth every 12 (twelve) hours. 10 tablet 0  . TRAMADOL HCL 50 MG PO TABS Oral Take 1 tablet (50 mg total) by mouth every 6 (six) hours as needed for pain. 15 tablet 0    BP 158/107  Pulse 107  Temp 97.7 F (36.5 C) (Oral)  Resp 16  Ht 5\' 7"  (1.702 m)  Wt 115 lb (52.164 kg)  BMI 18.01 kg/m2  SpO2 100%  LMP 10/16/2011  Physical Exam  Nursing note and vitals reviewed. Constitutional: She appears well-developed and well-nourished. No distress.  HENT:  Head: Normocephalic and atraumatic.  Neck: Normal range of motion.  Cardiovascular: Normal rate.   Pulmonary/Chest: Effort normal.  Musculoskeletal: Normal range of motion.       Left knee: No skin erythema, edema, or gross deformity noted. She does have tenderness to palpation along the medial and lateral joint lines and  becomes tearful with palpation of this area. No effusion noted. She has excellent functional range of motion in the knee from approximately 0-130 of flexion. Ligaments intact to Lachman, varus and valgus stressing. McMurray negative.  Left ankle: No skin  erythema. she does have some mild swelling at the lateral malleolus. She has excellent functional range of motion with dorsi, plantar flexion, inversion, and eversion. Tender to palpation at the lateral malleolus. Neurovascularly intact with sensory intact to light touch. DP/PT pulses 2+.  Neurological: She is alert.  Skin: Skin is warm and dry. She is not diaphoretic.  Psychiatric: She has a normal mood and affect.    ED Course  Procedures (including critical care time)  Labs Reviewed - No data to display Dg Ankle Complete Left  12/13/2011  *RADIOLOGY REPORT*  Clinical Data: Larey Seat and injured left ankle.  LEFT ANKLE COMPLETE - 3+ VIEW  Comparison: None.  Findings: No evidence of acute fracture or dislocation.  Ankle mortise intact with well-preserved joint space.  Moderate sized joint effusion/hemarthrosis.  IMPRESSION: No osseous abnormality.   Original Report Authenticated By: Arnell Sieving, M.D.    Dg Knee Complete 4 Views Left  12/13/2011  *RADIOLOGY REPORT*  Clinical Data: Larey Seat and injured left knee.  Generalized pain.  LEFT KNEE - COMPLETE 4+ VIEW  Comparison: None.  Findings: No evidence of acute, subacute, or healed fractures. Well-preserved joint spaces.  No intrinsic osseous abnormalities. No evidence of a significant joint effusion.  Note made of a phlebolith in the soft tissues of the proximal left calf.  IMPRESSION: No osseous abnormality.   Original Report Authenticated By: Arnell Sieving, M.D.      1. Left knee pain   2. Left ankle pain       MDM  Patient presents today with left knee and ankle pain after a fall. She has tenderness to palpation at both the knee and ankle. There is no deformity noted to the knee  and exam is reassuring. She does have some swelling over the lateral malleolus which likely represents a sprain. She is neurovascularly intact. We will plan to place her in a splint. She was given a small prescription for tramadol. Instructed on the importance of RICE. Reasons to return discussed.        Grant Fontana, PA-C 12/13/11 1625

## 2011-12-14 NOTE — ED Provider Notes (Signed)
Medical screening examination/treatment/procedure(s) were conducted as a shared visit with non-physician practitioner(s) and myself.  I personally evaluated the patient during the encounter.  Status post fall down stairs. No head or neck trauma. Plain films of left ankle and left knee were negative for fracture.  Donnetta Hutching, MD 12/14/11 [phone]

## 2011-12-14 NOTE — Discharge Summary (Signed)
[name]. Redmond Pulling, MD, FACS General, Bariatric, & Minimally Invasive Surgery [facility], [place]

## 2011-12-16 ENCOUNTER — Encounter (HOSPITAL_COMMUNITY): Payer: Self-pay | Admitting: *Deleted

## 2011-12-16 ENCOUNTER — Emergency Department (HOSPITAL_COMMUNITY)
Admission: EM | Admit: 2011-12-16 | Discharge: 2011-12-16 | Disposition: A | Discharge status: Home / Self Care | Payer: 59 | Attending: Emergency Medicine | Admitting: Emergency Medicine

## 2011-12-16 DIAGNOSIS — I10 Essential (primary) hypertension: Secondary | ICD-10-CM | POA: Insufficient documentation

## 2011-12-16 DIAGNOSIS — Z9089 Acquired absence of other organs: Secondary | ICD-10-CM | POA: Insufficient documentation

## 2011-12-16 DIAGNOSIS — R112 Nausea with vomiting, unspecified: Secondary | ICD-10-CM | POA: Insufficient documentation

## 2011-12-16 DIAGNOSIS — E109 Type 1 diabetes mellitus without complications: Secondary | ICD-10-CM | POA: Insufficient documentation

## 2011-12-16 DIAGNOSIS — R1013 Epigastric pain: Secondary | ICD-10-CM | POA: Insufficient documentation

## 2011-12-16 DIAGNOSIS — Z794 Long term (current) use of insulin: Secondary | ICD-10-CM | POA: Insufficient documentation

## 2011-12-16 DIAGNOSIS — R000 Tachycardia, unspecified: Secondary | ICD-10-CM | POA: Insufficient documentation

## 2011-12-16 HISTORY — DX: Gastroparesis: K31.84

## 2011-12-16 LAB — COMPREHENSIVE METABOLIC PANEL WITH GFR
ALT: 15 U/L (ref 0–35)
AST: 12 U/L (ref 0–37)
Albumin: 3.7 g/dL (ref 3.5–5.2)
Alkaline Phosphatase: 77 U/L (ref 39–117)
BUN: 6 mg/dL (ref 6–23)
CO2: 25 meq/L (ref 19–32)
Calcium: 9.7 mg/dL (ref 8.4–10.5)
Chloride: 97 meq/L (ref 96–112)
Creatinine, Ser: 0.34 mg/dL — ABNORMAL LOW (ref 0.50–1.10)
GFR calc Af Amer: >90 mL/min
GFR calc non Af Amer: >90 mL/min
Glucose, Bld: 319 mg/dL — ABNORMAL HIGH (ref 70–99)
Potassium: 3.9 meq/L (ref 3.5–5.1)
Sodium: 134 meq/L — ABNORMAL LOW (ref 135–145)
Total Bilirubin: 0.4 mg/dL (ref 0.3–1.2)
Total Protein: 7.3 g/dL (ref 6.0–8.3)

## 2011-12-16 LAB — URINALYSIS, ROUTINE W REFLEX MICROSCOPIC
Bilirubin Urine: NEGATIVE
Glucose, UA: >1000 mg/dL — AB
Hgb urine dipstick: NEGATIVE
Ketones, ur: NEGATIVE mg/dL
Leukocytes, UA: NEGATIVE
Nitrite: NEGATIVE
Protein, ur: 30 mg/dL — AB
Specific Gravity, Urine: 1.029 (ref 1.005–1.030)
Urobilinogen, UA: 0.2 mg/dL (ref 0.0–1.0)
pH: 6.5 (ref 5.0–8.0)

## 2011-12-16 LAB — CBC WITH DIFFERENTIAL/PLATELET
Basophils Absolute: 0 10*3/uL (ref 0.0–0.1)
Basophils Relative: 1 % (ref 0–1)
Eosinophils Absolute: 0.1 10*3/uL (ref 0.0–0.7)
Eosinophils Relative: 4 % (ref 0–5)
HCT: 34.3 % — ABNORMAL LOW (ref 36.0–46.0)
Hemoglobin: 12.1 g/dL (ref 12.0–15.0)
Lymphocytes Relative: 43 % (ref 12–46)
Lymphs Abs: 1.5 10*3/uL (ref 0.7–4.0)
MCH: 30.8 pg (ref 26.0–34.0)
MCHC: 35.3 g/dL (ref 30.0–36.0)
MCV: 87.3 fL (ref 78.0–100.0)
Monocytes Absolute: 0.2 10*3/uL (ref 0.1–1.0)
Monocytes Relative: 6 % (ref 3–12)
Neutro Abs: 1.7 10*3/uL (ref 1.7–7.7)
Neutrophils Relative %: 47 % (ref 43–77)
Platelets: 246 10*3/uL (ref 150–400)
RBC: 3.93 MIL/uL (ref 3.87–5.11)
RDW: 12.2 % (ref 11.5–15.5)
WBC: 3.5 10*3/uL — ABNORMAL LOW (ref 4.0–10.5)

## 2011-12-16 LAB — GLUCOSE, CAPILLARY: Glucose-Capillary: 297 mg/dL — ABNORMAL HIGH (ref 70–99)

## 2011-12-16 LAB — URINE MICROSCOPIC-ADD ON

## 2011-12-16 LAB — LIPASE, BLOOD: Lipase: 24 U/L (ref 11–59)

## 2011-12-16 LAB — POCT PREGNANCY, URINE: Preg Test, Ur: NEGATIVE

## 2011-12-16 MED ORDER — KETOROLAC TROMETHAMINE 30 MG/ML IJ SOLN
30.0000 mg | Freq: Once | INTRAMUSCULAR | Status: AC
  Administered 2011-12-16: 30 mg via INTRAVENOUS
  Filled 2011-12-16: qty 1

## 2011-12-16 MED ORDER — PROMETHAZINE HCL 25 MG PO TABS: 25.0000 mg | ORAL_TABLET | Freq: Four times a day (QID) | ORAL | Status: DC | PRN

## 2011-12-16 MED ORDER — ONDANSETRON HCL 4 MG/2ML IJ SOLN
4.0000 mg | Freq: Once | INTRAMUSCULAR | Status: AC
  Administered 2011-12-16: 4 mg via INTRAVENOUS
  Filled 2011-12-16: qty 2

## 2011-12-16 MED ORDER — FENTANYL CITRATE 0.05 MG/ML IJ SOLN
100.0000 ug | Freq: Once | INTRAMUSCULAR | Status: AC
  Administered 2011-12-16: 100 ug via INTRAVENOUS
  Filled 2011-12-16: qty 2

## 2011-12-16 NOTE — ED Notes (Signed)
Pt from home with reports of upper, mid abdominal pain that radiates around back as well as nausea and vomiting that started at 0600 this morning. Pt reports hx of gastroparesis and was treated here for same 3 days ago.

## 2011-12-16 NOTE — ED Notes (Signed)
Family at bedside. 

## 2011-12-16 NOTE — ED Provider Notes (Signed)
History     CSN: [id number]  Arrival date & time 12/16/11  0909   First MD Initiated Contact with Patient 12/16/11 1013      Chief Complaint  Patient presents with  . Abdominal Pain  . Nausea  . Emesis    (Consider location/radiation/quality/duration/timing/severity/associated sxs/prior treatment) HPI  The patient is a 21 yo female that presents to the ED with abdominal pain.  The patient is a diabetic with history of gastroparesis and chronic abdominal pain.  She recently underwent cholecystectomy with cholangiogram about 4 weeks ago followed by exploratory laparotomy 1 week later to assess free air in the peritoneum.  No source was found.  The abdominal pain began this morning around 6 am and is described as "burning."  The patient reports 8/10 pain with radiation into her back.  The patient reports some intermittent vomiting and diarrhea the last couple of days, but none this morning.  She denies hematemesis and hematochezia.  The patient also denies headache, dizziness, cough, chest pain, SOB, dysuria, and vaginal bleeding.  LMP was about 2 months ago in "July sometime."  She has not been sexually active for about 2 months. She has not taken any of her medications today.   Past Medical History  Diagnosis Date  . Diabetes mellitus   . Hypertension   . Diabetes type 1, uncontrolled 11/14/2011    Since age 2  . Gastroparesis     Past Surgical History  Procedure Date  . Ankle surgery   . Cholecystectomy 11/15/2011    Procedure: LAPAROSCOPIC CHOLECYSTECTOMY WITH INTRAOPERATIVE CHOLANGIOGRAM;  Surgeon: [name], MD;  Location: [facility];  Service: General;  Laterality: N/A;  . Laparoscopy 11/23/2011    Procedure: LAPAROSCOPY DIAGNOSTIC;  Surgeon: Mariella Saa, MD;  Location: [facility];  Service: General;  Laterality: N/A;  . Esophagogastroduodenoscopy 12/03/2011    Procedure: ESOPHAGOGASTRODUODENOSCOPY (EGD);  Surgeon: Theda Belfast, MD;  Location: [facility];  Service:  Endoscopy;  Laterality: N/A;    Family History  Problem Relation Age of Onset  . Diabetes Mother   . Hypertension Father     History  Substance Use Topics  . Smoking status: Never Smoker   . Smokeless tobacco: Never Used  . Alcohol Use: Yes     Occasional    OB History    Grav Para Term Preterm Abortions TAB SAB Ect Mult Living                  Review of Systems  All other systems negative except as documented in the HPI. All pertinent positives and negatives as reviewed in the HPI.   Allergies  Other  Home Medications   Current Outpatient Rx  Name Route Sig Dispense Refill  . ENALAPRIL MALEATE 10 MG PO TABS Oral Take 10 mg by mouth every evening.     Marland Kitchen HYDROCODONE-ACETAMINOPHEN 5-325 MG PO TABS Oral Take 1 tablet by mouth every 6 (six) hours as needed. Pain    . INSULIN ASPART 100 UNIT/ML [place] SOLN Subcutaneous Inject 0-15 Units into the skin 3 (three) times daily before meals. 5 u with each meal + sliding scale: CBG 70 - 120: 0 u: CBG 121 - 150: 2 u; CBG 151 - 200: 3 u; CBG 201 - 250: 5 u; CBG 251 - 300: 8 u;CBG 301 - 350: 11 u; CBG 351 - 400: 15 u; CBG > 400 Call MD    . INSULIN GLARGINE 100 UNIT/ML [place] SOLN Subcutaneous Inject 30 Units into  the skin at bedtime.     Marland Kitchen METOCLOPRAMIDE HCL 10 MG PO TABS Oral Take 10 mg by mouth 4 (four) times daily.    . ADULT MULTIVITAMIN W/MINERALS CH Oral Take 1 tablet by mouth daily.    Marland Kitchen OMEPRAZOLE 40 MG PO CPDR Oral Take 40 mg by mouth daily.    Marland Kitchen PROMETHAZINE HCL 25 MG PO TABS Oral Take 25 mg by mouth every 6 (six) hours as needed. For nausea    . CIPROFLOXACIN HCL 500 MG PO TABS Oral Take 1 tablet (500 mg total) by mouth every 12 (twelve) hours. 10 tablet 0  . TRAMADOL HCL 50 MG PO TABS Oral Take 1 tablet (50 mg total) by mouth every 6 (six) hours as needed for pain. 15 tablet 0    BP 137/96  Pulse 111  Resp 16  SpO2 100%  LMP 10/15/2011  Physical Exam  Constitutional: She is oriented to person, place, and time. She is  cooperative.       Patient is hypertensive.   HENT:  Head: Normocephalic and atraumatic.  Nose: Nose normal.  Mouth/Throat: Oropharynx is clear and moist.  Eyes: Conjunctivae normal and EOM are normal. Pupils are equal, round, and reactive to light. No scleral icterus.  Neck: Normal range of motion. Neck supple. No thyromegaly present.  Cardiovascular: Regular rhythm and normal heart sounds.  Tachycardia present.   Pulses:      Radial pulses are 2+ on the right side, and 2+ on the left side.       Dorsalis pedis pulses are 2+ on the right side, and 2+ on the left side.       Posterior tibial pulses are 2+ on the right side, and 2+ on the left side.  Pulmonary/Chest: Effort normal and breath sounds normal. No respiratory distress. She has no wheezes. She has no rales. She exhibits no tenderness.  Abdominal: Soft. Normal aorta. She exhibits no distension and no mass. There is tenderness in the epigastric area. There is no rebound and no CVA tenderness.  Musculoskeletal: Normal range of motion. She exhibits no edema.  Lymphadenopathy:    She has no cervical adenopathy.  Neurological: She is alert and oriented to person, place, and time.  Skin: Skin is warm and dry. No rash noted. No erythema.  Psychiatric: She has a normal mood and affect. Her behavior is normal. Judgment and thought content normal.      ED Course  Procedures (including critical care time)  Labs Reviewed  CBC WITH DIFFERENTIAL - Abnormal; Notable for the following:    WBC 3.5 (*)     HCT 34.3 (*)     All other components within normal limits  COMPREHENSIVE METABOLIC PANEL - Abnormal; Notable for the following:    Sodium 134 (*)     Glucose, Bld 319 (*)     Creatinine, Ser 0.34 (*)     All other components within normal limits  GLUCOSE, CAPILLARY - Abnormal; Notable for the following:    Glucose-Capillary 297 (*)     All other components within normal limits  LIPASE, BLOOD  URINALYSIS, ROUTINE W REFLEX  MICROSCOPIC   The patient is feeling vastly improved and would like to go home. She is told to return here as needed. Follow up with her PCP.  MDM  MDM Reviewed: vitals and nursing note Reviewed previous: labs Interpretation: labs  Patient heart rate did respond to fluids. Patient refused second liter of fluid.  Carlyle Dolly, PA-C 12/16/11 1430  Carlyle Dolly, PA-C 12/16/11 1431

## 2011-12-16 NOTE — ED Notes (Signed)
MD at bedside. 

## 2011-12-16 NOTE — ED Notes (Signed)
Patient is resting comfortably. 

## 2011-12-16 NOTE — ED Provider Notes (Signed)
Medical screening examination/treatment/procedure(s) were performed by non-physician practitioner and as supervising physician I was immediately available for consultation/collaboration.   Aurora Rody Y. Lylianna Fraiser, MD 12/16/11 1437 

## 2011-12-16 NOTE — ED Notes (Signed)
IV team at bedside attempting IV start. 

## 2011-12-16 NOTE — ED Notes (Signed)
IV team responded  

## 2011-12-16 NOTE — ED Provider Notes (Signed)
History    21yF with abdominal pain. Recent cholecystectomy and cholangiogram. Continued pain after and on evaluation a week later still noted to have free intraperitoneal air. Patient subsequently had a exploratory laparoscopy which did not show a source of the free air. She continued to have significant pain so GI was consulted. She had an EGD which showed significant amount of gastric contents retained. She had a gastric emptying study which showed approximately 90% retention of stomach contents after 2 hours. Pain.to be secondary to gastroparesis. Patient does have a history of diabetes. Her pain was very difficult to control during her past hospitalization. She required high-dose disorder topics for control. DC vision upon discharge though apparently seemed to be to discontinue the narcotics. She was started on erythromycin for motility. She is returning today because he continued pain. The pain is similar in character that to the pain she has been having. Associated with nausea and vomiting. No fevers or chills. No diarrhea. No urinary complaints.  CSN: [id number]  Arrival date & time 12/12/11  0144   First MD Initiated Contact with Patient 12/12/11 [phone]      Chief Complaint  Patient presents with  . Abdominal Pain    (Consider location/radiation/quality/duration/timing/severity/associated sxs/prior treatment) HPI  Past Medical History  Diagnosis Date  . Diabetes mellitus   . Hypertension   . Diabetes type 1, uncontrolled 11/14/2011    Since age 89  . Gastroparesis     Past Surgical History  Procedure Date  . Ankle surgery   . Cholecystectomy 11/15/2011    Procedure: LAPAROSCOPIC CHOLECYSTECTOMY WITH INTRAOPERATIVE CHOLANGIOGRAM;  Surgeon: [name], MD;  Location: [facility];  Service: General;  Laterality: N/A;  . Laparoscopy 11/23/2011    Procedure: LAPAROSCOPY DIAGNOSTIC;  Surgeon: Edward Jolly, MD;  Location: [facility];  Service: General;  Laterality: N/A;  .  Esophagogastroduodenoscopy 12/03/2011    Procedure: ESOPHAGOGASTRODUODENOSCOPY (EGD);  Surgeon: Beryle Beams, MD;  Location: [facility];  Service: Endoscopy;  Laterality: N/A;    Family History  Problem Relation Age of Onset  . Diabetes Mother   . Hypertension Father     History  Substance Use Topics  . Smoking status: Never Smoker   . Smokeless tobacco: Never Used  . Alcohol Use: Yes     Occasional    OB History    Grav Para Term Preterm Abortions TAB SAB Ect Mult Living                  Review of Systems   Review of symptoms negative unless otherwise noted in HPI.   Allergies  Other  Home Medications   Current Outpatient Rx  Name Route Sig Dispense Refill  . ENALAPRIL MALEATE 10 MG PO TABS Oral Take 10 mg by mouth every evening.     Marland Kitchen HYDROCODONE-ACETAMINOPHEN 5-325 MG PO TABS Oral Take 1 tablet by mouth every 6 (six) hours as needed. Pain    . INSULIN ASPART 100 UNIT/ML [place] SOLN Subcutaneous Inject 0-15 Units into the skin 3 (three) times daily before meals. 5 u with each meal + sliding scale: CBG 70 - 120: 0 u: CBG 121 - 150: 2 u; CBG 151 - 200: 3 u; CBG 201 - 250: 5 u; CBG 251 - 300: 8 u;CBG 301 - 350: 11 u; CBG 351 - 400: 15 u; CBG > 400 Call MD    . INSULIN GLARGINE 100 UNIT/ML [place] SOLN Subcutaneous Inject 30 Units into the skin at bedtime.     Marland Kitchen  METOCLOPRAMIDE HCL 10 MG PO TABS Oral Take 10 mg by mouth 4 (four) times daily.    . ADULT MULTIVITAMIN W/MINERALS CH Oral Take 1 tablet by mouth daily.    Marland Kitchen PROMETHAZINE HCL 25 MG PO TABS Oral Take 25 mg by mouth every 6 (six) hours as needed. For nausea    . CIPROFLOXACIN HCL 500 MG PO TABS Oral Take 1 tablet (500 mg total) by mouth every 12 (twelve) hours. 10 tablet 0  . OMEPRAZOLE 40 MG PO CPDR Oral Take 40 mg by mouth daily.    . TRAMADOL HCL 50 MG PO TABS Oral Take 1 tablet (50 mg total) by mouth every 6 (six) hours as needed for pain. 15 tablet 0    BP 130/83  Pulse 107  Temp 97.7 F (36.5 C) (Oral)  Resp 18   SpO2 99%  LMP 10/16/2011  Physical Exam  Nursing note and vitals reviewed. Constitutional: She appears well-developed and well-nourished.       Laying in bed. Moderately uncomfortable appearing.  HENT:  Head: Normocephalic and atraumatic.  Eyes: Conjunctivae normal are normal. Right eye exhibits no discharge. Left eye exhibits no discharge.  Neck: Neck supple.  Cardiovascular: Regular rhythm and normal heart sounds.  Exam reveals no gallop and no friction rub.   No murmur heard.      Mildly tachycardic with regular rhythm.  Pulmonary/Chest: Effort normal and breath sounds normal. No respiratory distress.  Abdominal: Soft. She exhibits no distension. There is tenderness.       Moderate tenderness in epigastrium without guarding or rebound tenderness.  Musculoskeletal: She exhibits no edema and no tenderness.  Neurological: She is alert.  Skin: Skin is warm and dry.  Psychiatric: She has a normal mood and affect. Her behavior is normal. Thought content normal.    ED Course  Procedures (including critical care time)  Labs Reviewed  CBC WITH DIFFERENTIAL - Abnormal; Notable for the following:    HCT 34.2 (*)     Neutrophils Relative 41 (*)     Lymphocytes Relative 51 (*)     All other components within normal limits  COMPREHENSIVE METABOLIC PANEL - Abnormal; Notable for the following:    Sodium 134 (*)     Glucose, Bld 318 (*)     Creatinine, Ser 0.43 (*)     All other components within normal limits  LIPASE, BLOOD - Abnormal; Notable for the following:    Lipase 74 (*)     All other components within normal limits  URINALYSIS, ROUTINE W REFLEX MICROSCOPIC - Abnormal; Notable for the following:    APPearance CLOUDY (*)     Glucose, UA >1000 (*)     Protein, ur 100 (*)     All other components within normal limits  URINE MICROSCOPIC-ADD ON - Abnormal; Notable for the following:    Bacteria, UA MANY (*)     All other components within normal limits  PREGNANCY, URINE    URINE CULTURE  LAB REPORT - SCANNED   No results found.   1. Chronic abdominal pain   2. UTI (lower urinary tract infection)       MDM  21yf with abdominal pain. Chronic in nature and likely from gastroparesis. Pt is tender on exam but not peritoneal. Reports improved symptoms with meds. UA suggestive of infection. Although pt's symptoms atypical for cystitis or pyelonephritis will tx.  Patient on a promotility agent. She has GI followup. Return precautions were discussed.  Virgel Manifold, MD 12/16/11 1010

## 2011-12-17 IMAGING — CR DG SACRUM/COCCYX 2+V
3 series · 3 of 3 positions shown · non-contrast
Comparison: None.

CLINICAL DATA: Fall down stairs.  Limited movement.

SACRUM AND COCCYX - 2+ VIEW

[t sacrum a.p.]
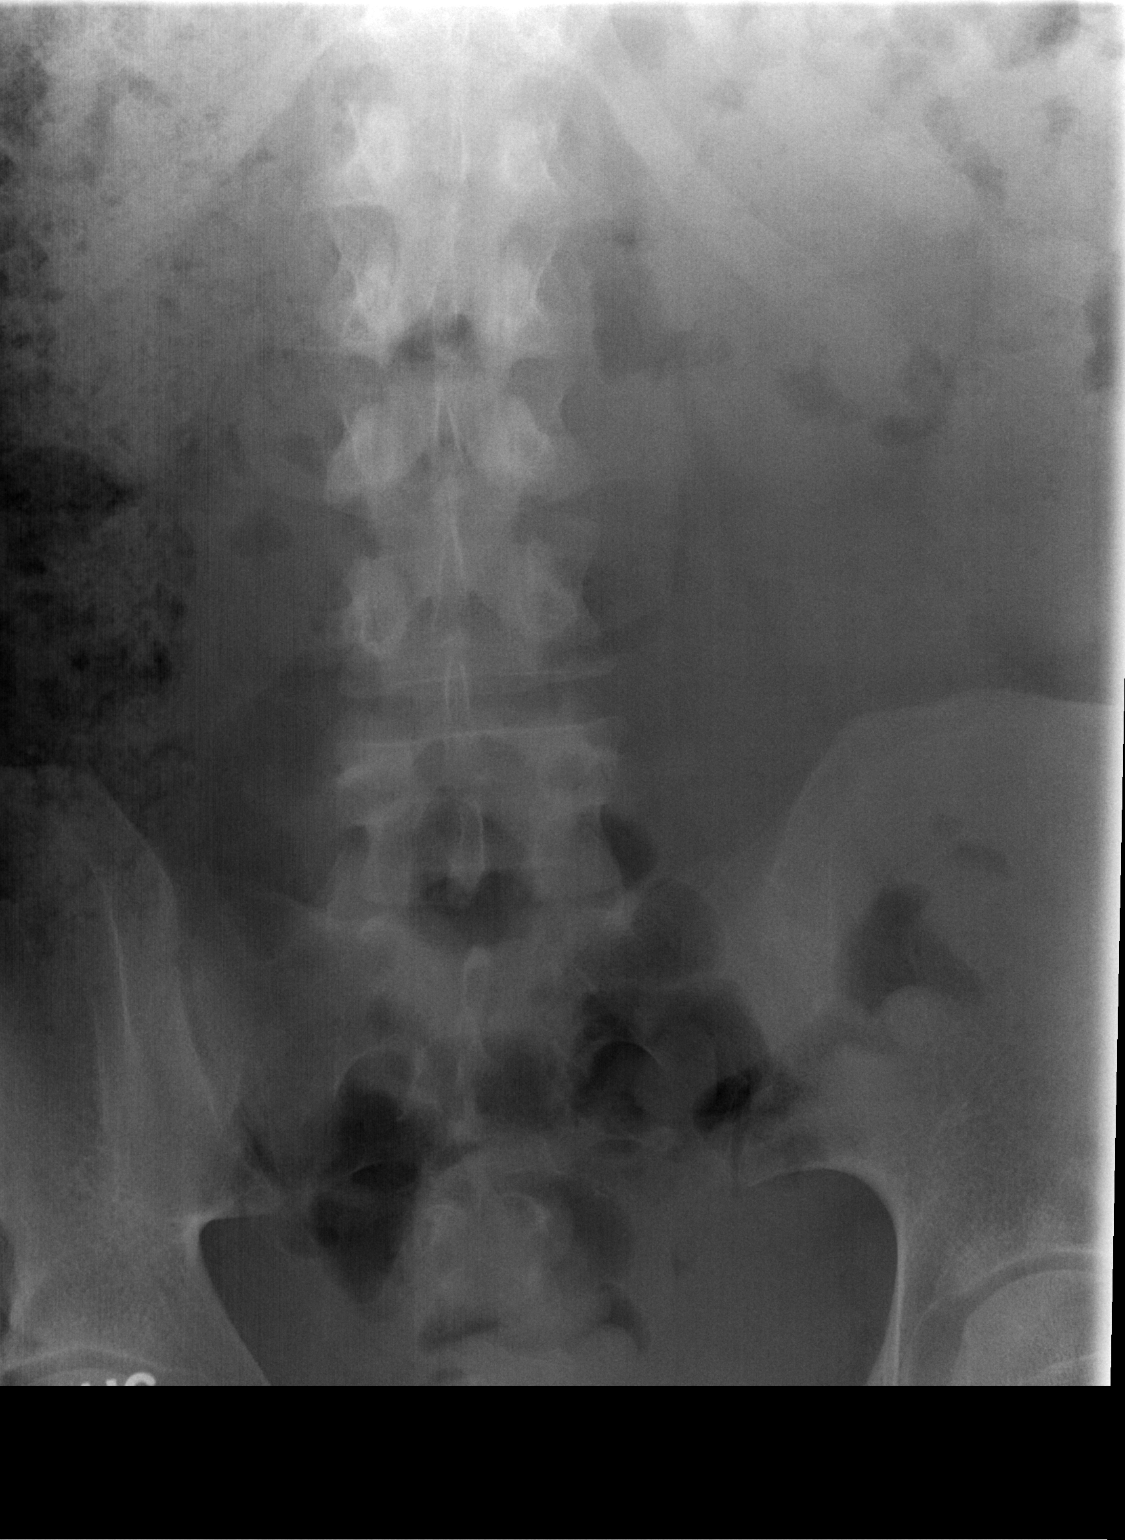

[t coccyx a.p.]
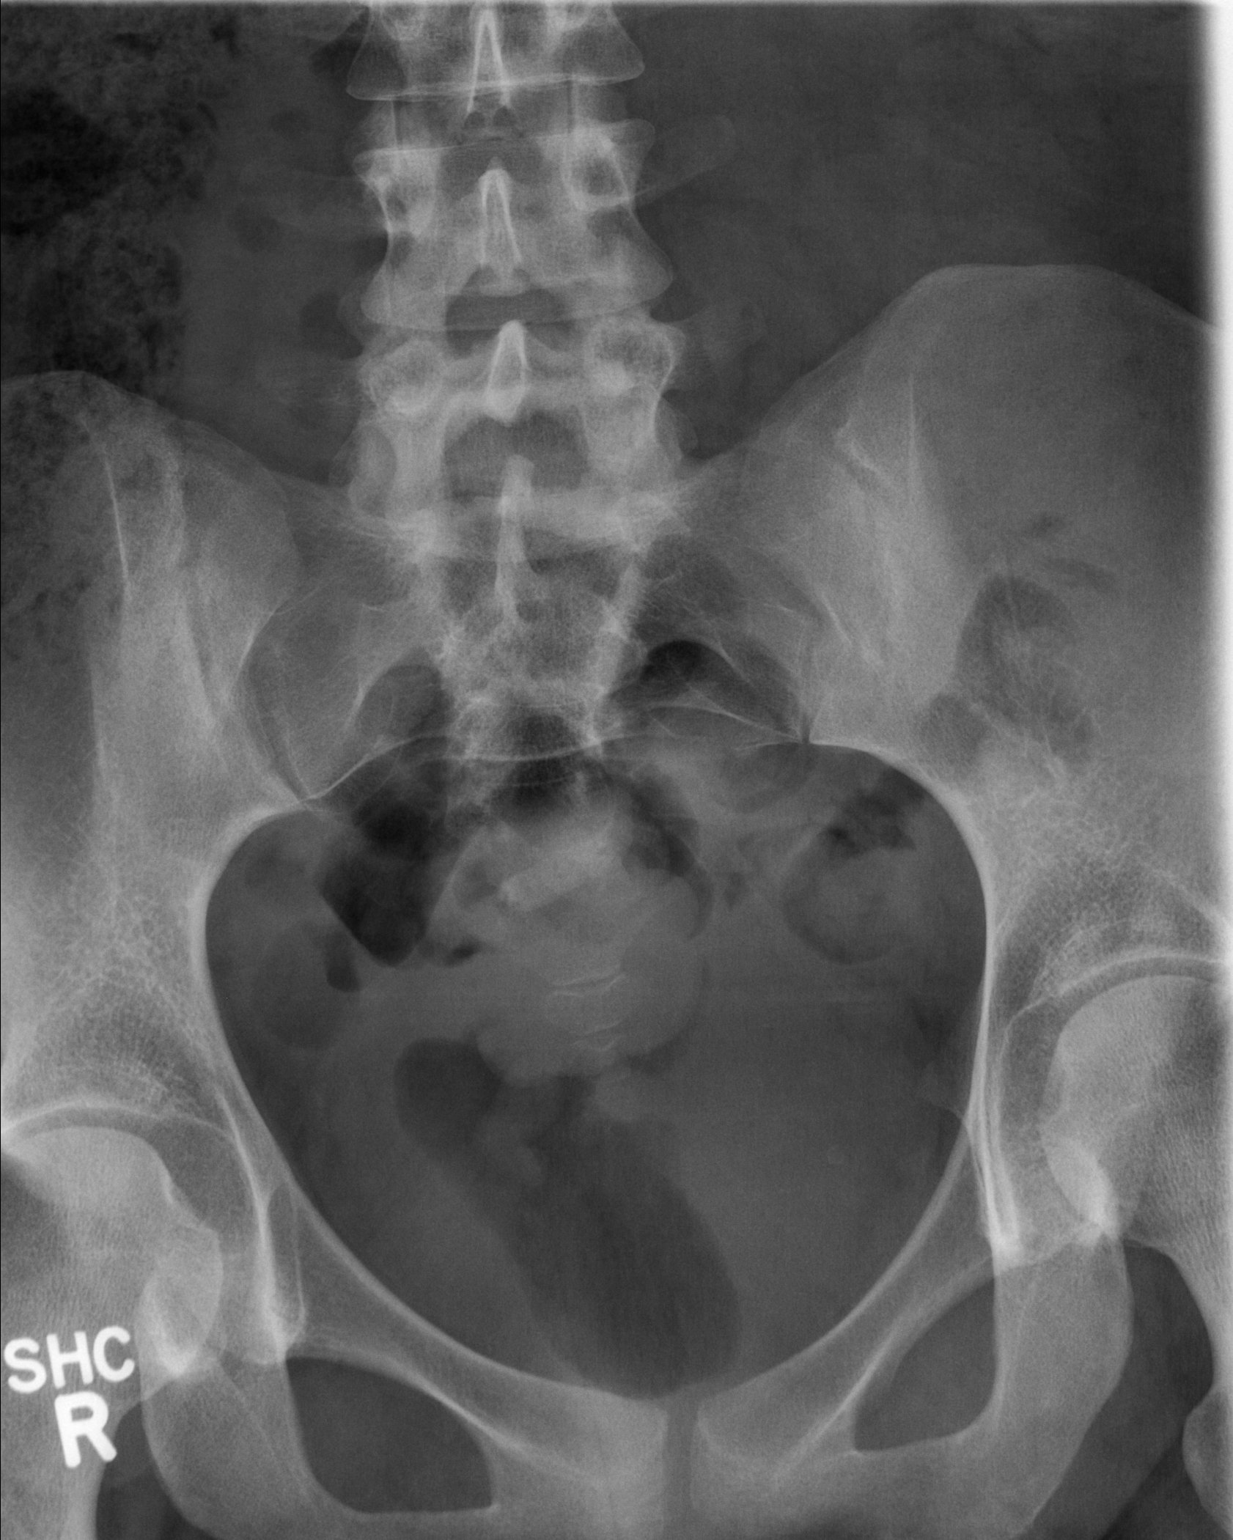

[t sacrum lat]
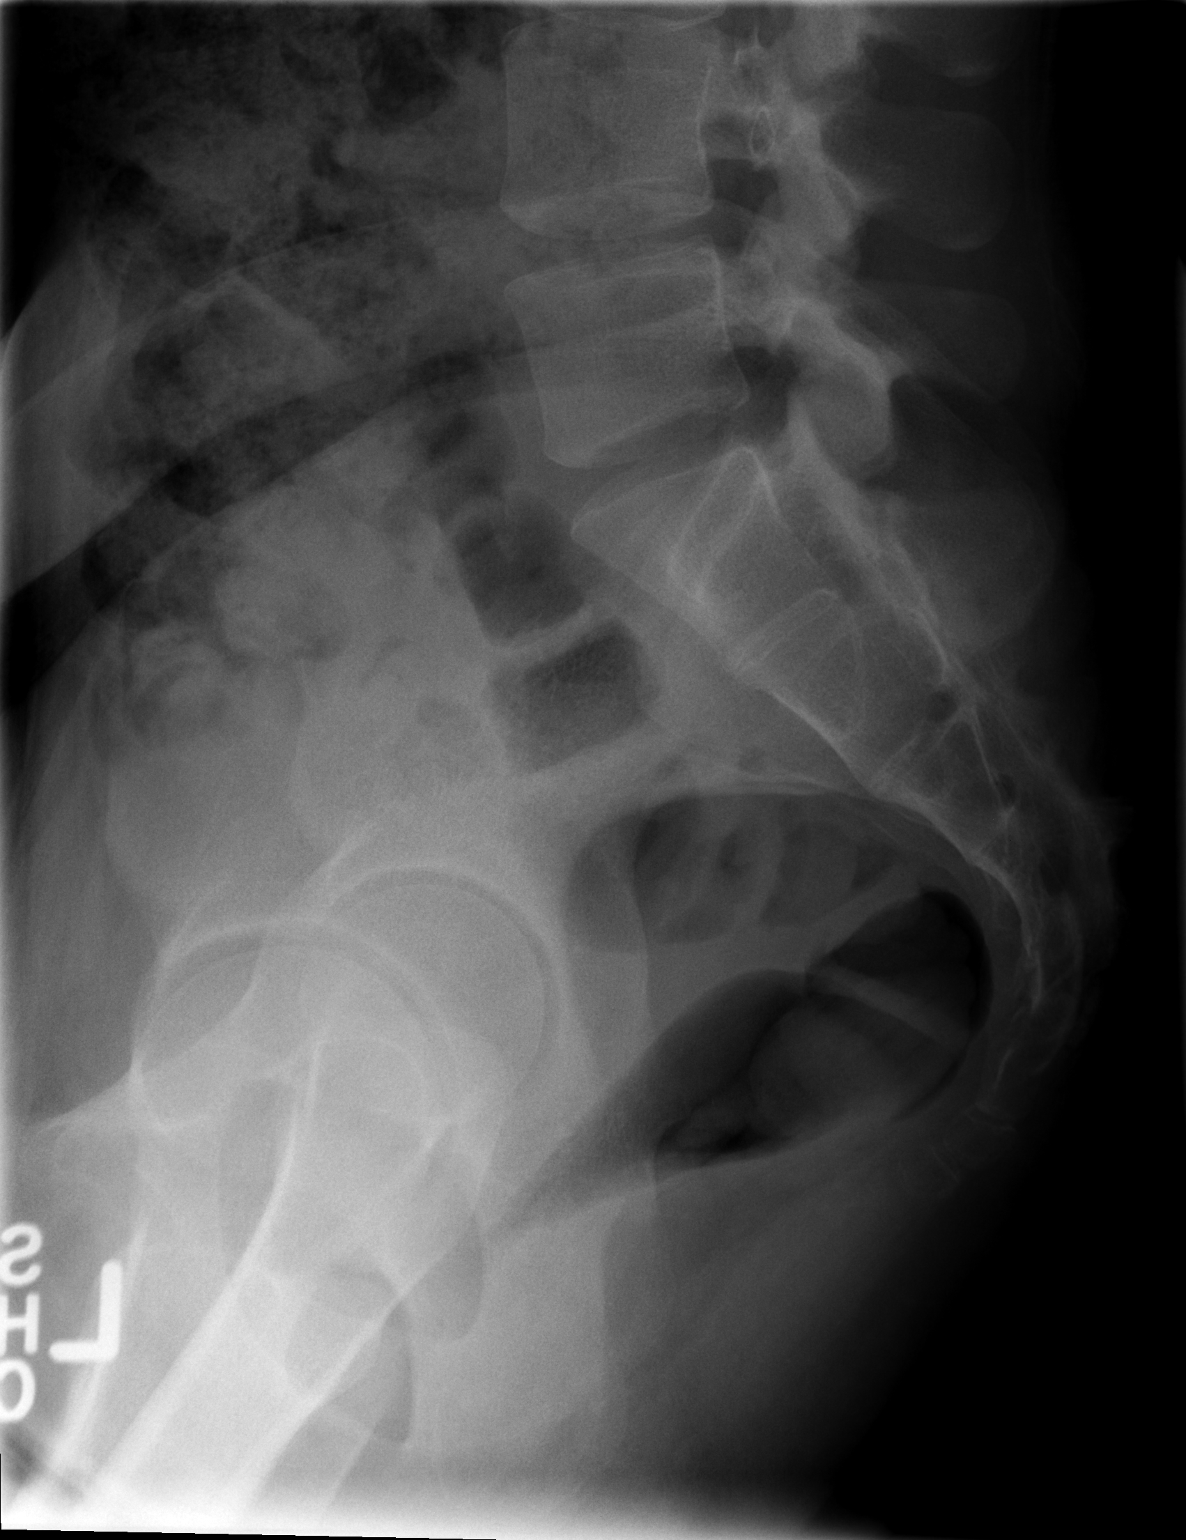

[3 of 3 positions shown; findings below may reference images not displayed]

FINDINGS: No disruption of the arcuate lines is identified.  No
definite abnormal cortical discontinuity is noted although portions
of the anterior cortex are indistinct in the lower sacrum.
IMPRESSION: 1.  No definite fracture.  MRI can provide greater diagnostic
sensitivity in assessing the sacrum and coccyx.

## 2011-12-24 ENCOUNTER — Encounter (INDEPENDENT_AMBULATORY_CARE_PROVIDER_SITE_OTHER): Payer: 59 | Admitting: Surgery

## 2012-01-07 ENCOUNTER — Encounter (HOSPITAL_COMMUNITY): Payer: Self-pay | Admitting: Emergency Medicine

## 2012-01-07 ENCOUNTER — Emergency Department (HOSPITAL_COMMUNITY)
Admission: EM | Admit: 2012-01-07 | Discharge: 2012-01-07 | Disposition: A | Discharge status: Home / Self Care | Payer: 59 | Attending: Emergency Medicine | Admitting: Emergency Medicine

## 2012-01-07 DIAGNOSIS — IMO0002 Reserved for concepts with insufficient information to code with codable children: Secondary | ICD-10-CM | POA: Insufficient documentation

## 2012-01-07 DIAGNOSIS — R112 Nausea with vomiting, unspecified: Secondary | ICD-10-CM

## 2012-01-07 DIAGNOSIS — E1065 Type 1 diabetes mellitus with hyperglycemia: Secondary | ICD-10-CM | POA: Insufficient documentation

## 2012-01-07 DIAGNOSIS — R109 Unspecified abdominal pain: Secondary | ICD-10-CM

## 2012-01-07 DIAGNOSIS — I10 Essential (primary) hypertension: Secondary | ICD-10-CM | POA: Insufficient documentation

## 2012-01-07 DIAGNOSIS — Z9089 Acquired absence of other organs: Secondary | ICD-10-CM | POA: Insufficient documentation

## 2012-01-07 DIAGNOSIS — K3184 Gastroparesis: Secondary | ICD-10-CM | POA: Insufficient documentation

## 2012-01-07 DIAGNOSIS — Z794 Long term (current) use of insulin: Secondary | ICD-10-CM | POA: Insufficient documentation

## 2012-01-07 DIAGNOSIS — E1165 Type 2 diabetes mellitus with hyperglycemia: Secondary | ICD-10-CM

## 2012-01-07 DIAGNOSIS — R739 Hyperglycemia, unspecified: Secondary | ICD-10-CM

## 2012-01-07 LAB — CBC WITH DIFFERENTIAL/PLATELET
Basophils Absolute: 0 10*3/uL (ref 0.0–0.1)
Basophils Relative: 0 % (ref 0–1)
Eosinophils Absolute: 0 10*3/uL (ref 0.0–0.7)
Eosinophils Relative: 0 % (ref 0–5)
HCT: 36.3 % (ref 36.0–46.0)
Hemoglobin: 13.2 g/dL (ref 12.0–15.0)
Lymphocytes Relative: 33 % (ref 12–46)
Lymphs Abs: 1 10*3/uL (ref 0.7–4.0)
MCH: 30.9 pg (ref 26.0–34.0)
MCHC: 36.4 g/dL — ABNORMAL HIGH (ref 30.0–36.0)
MCV: 85 fL (ref 78.0–100.0)
Monocytes Absolute: 0.2 10*3/uL (ref 0.1–1.0)
Monocytes Relative: 7 % (ref 3–12)
Neutro Abs: 1.8 10*3/uL (ref 1.7–7.7)
Neutrophils Relative %: 59 % (ref 43–77)
Platelets: 240 10*3/uL (ref 150–400)
RBC: 4.27 MIL/uL (ref 3.87–5.11)
RDW: 11.3 % — ABNORMAL LOW (ref 11.5–15.5)
WBC: 3.1 10*3/uL — ABNORMAL LOW (ref 4.0–10.5)

## 2012-01-07 LAB — URINALYSIS, ROUTINE W REFLEX MICROSCOPIC
Bilirubin Urine: NEGATIVE
Glucose, UA: >1000 mg/dL — AB
Hgb urine dipstick: NEGATIVE
Ketones, ur: NEGATIVE mg/dL
Leukocytes, UA: NEGATIVE
Nitrite: NEGATIVE
Protein, ur: 100 mg/dL — AB
Specific Gravity, Urine: 1.039 — ABNORMAL HIGH (ref 1.005–1.030)
Urobilinogen, UA: 0.2 mg/dL (ref 0.0–1.0)
pH: 6.5 (ref 5.0–8.0)

## 2012-01-07 LAB — URINE MICROSCOPIC-ADD ON

## 2012-01-07 LAB — BASIC METABOLIC PANEL
BUN: 10 mg/dL (ref 6–23)
CO2: 26 mEq/L (ref 19–32)
Calcium: 8.8 mg/dL (ref 8.4–10.5)
Chloride: 99 mEq/L (ref 96–112)
Creatinine, Ser: 0.41 mg/dL — ABNORMAL LOW (ref 0.50–1.10)
GFR calc Af Amer: >90 mL/min (ref >90)
GFR calc non Af Amer: >90 mL/min (ref >90)
Glucose, Bld: 309 mg/dL — ABNORMAL HIGH (ref 70–99)
Potassium: 4 mEq/L (ref 3.5–5.1)
Sodium: 133 mEq/L — ABNORMAL LOW (ref 135–145)

## 2012-01-07 LAB — GLUCOSE, CAPILLARY
Glucose-Capillary: 366 mg/dL — ABNORMAL HIGH (ref 70–99)
Glucose-Capillary: 356 mg/dL — ABNORMAL HIGH (ref 70–99)
Glucose-Capillary: 295 mg/dL — ABNORMAL HIGH (ref 70–99)
Glucose-Capillary: 292 mg/dL — ABNORMAL HIGH (ref 70–99)

## 2012-01-07 LAB — COMPREHENSIVE METABOLIC PANEL
ALT: 14 U/L (ref 0–35)
AST: 13 U/L (ref 0–37)
Albumin: 3.9 g/dL (ref 3.5–5.2)
Alkaline Phosphatase: 84 U/L (ref 39–117)
BUN: 12 mg/dL (ref 6–23)
CO2: 26 mEq/L (ref 19–32)
Calcium: 9.7 mg/dL (ref 8.4–10.5)
Chloride: 95 mEq/L — ABNORMAL LOW (ref 96–112)
Creatinine, Ser: 0.42 mg/dL — ABNORMAL LOW (ref 0.50–1.10)
GFR calc Af Amer: >90 mL/min (ref >90)
GFR calc non Af Amer: >90 mL/min (ref >90)
Glucose, Bld: 425 mg/dL — ABNORMAL HIGH (ref 70–99)
Potassium: 4.4 mEq/L (ref 3.5–5.1)
Sodium: 131 mEq/L — ABNORMAL LOW (ref 135–145)
Total Bilirubin: 0.4 mg/dL (ref 0.3–1.2)
Total Protein: 7.5 g/dL (ref 6.0–8.3)

## 2012-01-07 LAB — PREGNANCY, URINE: Preg Test, Ur: NEGATIVE

## 2012-01-07 LAB — LIPASE, BLOOD: Lipase: 33 U/L (ref 11–59)

## 2012-01-07 MED ORDER — ONDANSETRON HCL 4 MG/2ML IJ SOLN
4.0000 mg | Freq: Once | INTRAMUSCULAR | Status: AC
  Administered 2012-01-07: 4 mg via INTRAVENOUS
  Filled 2012-01-07: qty 2

## 2012-01-07 MED ORDER — ONDANSETRON HCL 4 MG PO TABS: 4.0000 mg | ORAL_TABLET | Freq: Four times a day (QID) | ORAL | Status: DC

## 2012-01-07 MED ORDER — HYDROMORPHONE HCL PF 1 MG/ML IJ SOLN
1.0000 mg | Freq: Once | INTRAMUSCULAR | Status: AC
  Administered 2012-01-07: 1 mg via INTRAVENOUS
  Filled 2012-01-07: qty 1

## 2012-01-07 MED ORDER — SODIUM CHLORIDE 0.9 % IV BOLUS (SEPSIS)
1000.0000 mL | Freq: Once | INTRAVENOUS | Status: AC
  Administered 2012-01-07: 1000 mL via INTRAVENOUS

## 2012-01-07 MED ORDER — HYDROMORPHONE HCL PF 1 MG/ML IJ SOLN
0.5000 mg | Freq: Once | INTRAMUSCULAR | Status: AC
Start: 2012-01-07 — End: 2012-01-07
  Administered 2012-01-07: 0.5 mg via INTRAMUSCULAR
  Filled 2012-01-07: qty 1

## 2012-01-07 NOTE — ED Notes (Addendum)
Pt reports upper abd pain and N/V for several months. Seen here for same on 9/17, sx have not improved since. Pt states BP and HR are always elevated, denies chest pain, SOB, headache, dizziness, or any additional sx.

## 2012-01-07 NOTE — ED Provider Notes (Signed)
History     CSN: [id number]  Arrival date & time 01/07/12  1258   First MD Initiated Contact with Patient 01/07/12 1514      Chief Complaint  Patient presents with  . Abdominal Pain    (Consider location/radiation/quality/duration/timing/severity/associated sxs/prior treatment) HPI Comments: 21 y/o female with hx of gastroparesis presents with 4 months of abdominal pain worsening this morning while at work. Describes pain as sharp, constant, non-radiating rated 10/10. Tried taking reglan and phenergan without relief of her pain. Admits to associated nausea and vomiting which has been present for about 4 months. She saw her GI doctor Dr. Loreta Ave last week who gave her the reglan and phenergan. Dr. Loreta Ave referred her to neurology where she has an appointment next week for possible neurologic abdominal pain. Admits to 40-50 pound weight loss over the past 4 months. Had no appetite. Denies fever, chills, chest pain, sob, lightheadedness, dizziness.   The history is provided by the patient.    Past Medical History  Diagnosis Date  . Diabetes mellitus   . Hypertension   . Diabetes type 1, uncontrolled 11/14/2011    Since age 12  . Gastroparesis     Past Surgical History  Procedure Date  . Ankle surgery   . Cholecystectomy 11/15/2011    Procedure: LAPAROSCOPIC CHOLECYSTECTOMY WITH INTRAOPERATIVE CHOLANGIOGRAM;  Surgeon: [name], MD;  Location: [facility];  Service: General;  Laterality: N/A;  . Laparoscopy 11/23/2011    Procedure: LAPAROSCOPY DIAGNOSTIC;  Surgeon: Mariella Saa, MD;  Location: [facility];  Service: General;  Laterality: N/A;  . Esophagogastroduodenoscopy 12/03/2011    Procedure: ESOPHAGOGASTRODUODENOSCOPY (EGD);  Surgeon: Theda Belfast, MD;  Location: [facility];  Service: Endoscopy;  Laterality: N/A;    Family History  Problem Relation Age of Onset  . Diabetes Mother   . Hypertension Father     History  Substance Use Topics  . Smoking status: Never Smoker     . Smokeless tobacco: Never Used  . Alcohol Use: Yes     Occasional    OB History    Grav Para Term Preterm Abortions TAB SAB Ect Mult Living                  Review of Systems  Constitutional: Positive for appetite change and fatigue. Negative for fever and chills.  HENT: Negative for neck pain and neck stiffness.   Eyes: Negative for visual disturbance.  Respiratory: Negative for shortness of breath.   Cardiovascular: Negative for chest pain.  Gastrointestinal: Positive for nausea, vomiting and abdominal pain.  Genitourinary: Negative for difficulty urinating.  Musculoskeletal: Negative for arthralgias.  Skin: Positive for color change and rash.  Neurological: Positive for weakness. Negative for dizziness and light-headedness.  Psychiatric/Behavioral: Negative for confusion.    Allergies  Other  Home Medications   Current Outpatient Rx  Name Route Sig Dispense Refill  . CYCLOBENZAPRINE HCL 10 MG PO TABS Oral Take 10 mg by mouth at bedtime.    . ENALAPRIL MALEATE 10 MG PO TABS Oral Take 10 mg by mouth every evening.     Marland Kitchen HYDROCODONE-ACETAMINOPHEN 5-325 MG PO TABS Oral Take 1 tablet by mouth every 6 (six) hours as needed. Pain    . INSULIN ASPART 100 UNIT/ML [place] SOLN Subcutaneous Inject 0-15 Units into the skin 3 (three) times daily before meals. 5 u with each meal + sliding scale: CBG 70 - 120: 0 u: CBG 121 - 150: 2 u; CBG 151 - 200: 3 u;  CBG 201 - 250: 5 u; CBG 251 - 300: 8 u;CBG 301 - 350: 11 u; CBG 351 - 400: 15 u; CBG > 400 Call MD    . INSULIN GLARGINE 100 UNIT/ML [place] SOLN Subcutaneous Inject 30 Units into the skin at bedtime.     Marland Kitchen METOCLOPRAMIDE HCL 10 MG PO TABS Oral Take 10 mg by mouth 4 (four) times daily.    . ADULT MULTIVITAMIN W/MINERALS CH Oral Take 1 tablet by mouth daily.    Marland Kitchen OMEPRAZOLE 40 MG PO CPDR Oral Take 40 mg by mouth daily.    Marland Kitchen PROMETHAZINE HCL 25 MG PO TABS Oral Take 25 mg by mouth every 6 (six) hours as needed. For nausea      BP 147/103  Pulse  140  Temp 98 F (36.7 C) (Oral)  Resp 18  SpO2 100%  LMP 10/15/2011  Physical Exam  Nursing note and vitals reviewed. Constitutional: She is oriented to person, place, and time. She appears well-developed. She appears cachectic. No distress.  HENT:  Head: Normocephalic and atraumatic.  Mouth/Throat: Oropharynx is clear and moist. Mucous membranes are pale.  Eyes: Conjunctivae normal and EOM are normal. Pupils are equal, round, and reactive to light. No scleral icterus.  Neck: Normal range of motion. Neck supple.  Cardiovascular: Regular rhythm, normal heart sounds and intact distal pulses.  Tachycardia present.   Pulmonary/Chest: Effort normal and breath sounds normal. She has no decreased breath sounds.  Abdominal: Soft. Normal appearance and bowel sounds are normal. She exhibits no mass. There is tenderness in the epigastric area. There is no rigidity, no rebound, no guarding and no CVA tenderness.  Musculoskeletal: Normal range of motion.  Neurological: She is alert and oriented to person, place, and time. She has normal strength.  Skin: Skin is warm, dry and intact. She is not diaphoretic.  Psychiatric: She has a normal mood and affect. Her speech is normal and behavior is normal.    ED Course  Procedures (including critical care time)  Labs Reviewed  COMPREHENSIVE METABOLIC PANEL - Abnormal; Notable for the following:    Sodium 131 (*)     Chloride 95 (*)     Glucose, Bld 425 (*)     Creatinine, Ser 0.42 (*)     All other components within normal limits  CBC WITH DIFFERENTIAL - Abnormal; Notable for the following:    WBC 3.1 (*)     MCHC 36.4 (*)     RDW 11.3 (*)     All other components within normal limits  URINALYSIS, ROUTINE W REFLEX MICROSCOPIC - Abnormal; Notable for the following:    Specific Gravity, Urine 1.039 (*)     Glucose, UA >1000 (*)     Protein, ur 100 (*)     All other components within normal limits  URINE MICROSCOPIC-ADD ON - Abnormal; Notable for  the following:    Bacteria, UA FEW (*)     All other components within normal limits  GLUCOSE, CAPILLARY - Abnormal; Notable for the following:    Glucose-Capillary 366 (*)     All other components within normal limits  LIPASE, BLOOD  PREGNANCY, URINE   Date: 01/07/2012  Rate: 121  Rhythm: sinus tachycardia  QRS Axis: normal  Intervals: normal  ST/T Wave abnormalities: normal  Conduction Disutrbances:none  Narrative Interpretation: no stemi  Old EKG Reviewed: unchanged Results for orders placed during the hospital encounter of 01/07/12  COMPREHENSIVE METABOLIC PANEL      Component Value Range  Sodium 131 (*) 135 - 145 mEq/L   Potassium 4.4  3.5 - 5.1 mEq/L   Chloride 95 (*) 96 - 112 mEq/L   CO2 26  19 - 32 mEq/L   Glucose, Bld 425 (*) 70 - 99 mg/dL   BUN 12  6 - 23 mg/dL   Creatinine, Ser 1.61 (*) 0.50 - 1.10 mg/dL   Calcium 9.7  8.4 - 09.6 mg/dL   Total Protein 7.5  6.0 - 8.3 g/dL   Albumin 3.9  3.5 - 5.2 g/dL   AST 13  0 - 37 U/L   ALT 14  0 - 35 U/L   Alkaline Phosphatase 84  39 - 117 U/L   Total Bilirubin 0.4  0.3 - 1.2 mg/dL   GFR calc non Af Amer >90  >90 mL/min   GFR calc Af Amer >90  >90 mL/min  CBC WITH DIFFERENTIAL      Component Value Range   WBC 3.1 (*) 4.0 - 10.5 K/uL   RBC 4.27  3.87 - 5.11 MIL/uL   Hemoglobin 13.2  12.0 - 15.0 g/dL   HCT 04.5  40.9 - 81.1 %   MCV 85.0  78.0 - 100.0 fL   MCH 30.9  26.0 - 34.0 pg   MCHC 36.4 (*) 30.0 - 36.0 g/dL   RDW 91.4 (*) 78.2 - 95.6 %   Platelets 240  150 - 400 K/uL   Neutrophils Relative 59  43 - 77 %   Neutro Abs 1.8  1.7 - 7.7 K/uL   Lymphocytes Relative 33  12 - 46 %   Lymphs Abs 1.0  0.7 - 4.0 K/uL   Monocytes Relative 7  3 - 12 %   Monocytes Absolute 0.2  0.1 - 1.0 K/uL   Eosinophils Relative 0  0 - 5 %   Eosinophils Absolute 0.0  0.0 - 0.7 K/uL   Basophils Relative 0  0 - 1 %   Basophils Absolute 0.0  0.0 - 0.1 K/uL  LIPASE, BLOOD      Component Value Range   Lipase 33  11 - 59 U/L  URINALYSIS,  ROUTINE W REFLEX MICROSCOPIC      Component Value Range   Color, Urine YELLOW  YELLOW   APPearance CLEAR  CLEAR   Specific Gravity, Urine 1.039 (*) 1.005 - 1.030   pH 6.5  5.0 - 8.0   Glucose, UA >1000 (*) NEGATIVE mg/dL   Hgb urine dipstick NEGATIVE  NEGATIVE   Bilirubin Urine NEGATIVE  NEGATIVE   Ketones, ur NEGATIVE  NEGATIVE mg/dL   Protein, ur 213 (*) NEGATIVE mg/dL   Urobilinogen, UA 0.2  0.0 - 1.0 mg/dL   Nitrite NEGATIVE  NEGATIVE   Leukocytes, UA NEGATIVE  NEGATIVE  PREGNANCY, URINE      Component Value Range   Preg Test, Ur NEGATIVE  NEGATIVE  URINE MICROSCOPIC-ADD ON      Component Value Range   WBC, UA 3-6  <3 WBC/hpf   RBC / HPF 3-6  <3 RBC/hpf   Bacteria, UA FEW (*) RARE   Urine-Other MUCOUS PRESENT    GLUCOSE, CAPILLARY      Component Value Range   Glucose-Capillary 366 (*) 70 - 99 mg/dL  GLUCOSE, CAPILLARY      Component Value Range   Glucose-Capillary 356 (*) 70 - 99 mg/dL  GLUCOSE, CAPILLARY      Component Value Range   Glucose-Capillary 295 (*) 70 - 99 mg/dL  BASIC METABOLIC PANEL  Component Value Range   Sodium 133 (*) 135 - 145 mEq/L   Potassium 4.0  3.5 - 5.1 mEq/L   Chloride 99  96 - 112 mEq/L   CO2 26  19 - 32 mEq/L   Glucose, Bld 309 (*) 70 - 99 mg/dL   BUN 10  6 - 23 mg/dL   Creatinine, Ser 1.61 (*) 0.50 - 1.10 mg/dL   Calcium 8.8  8.4 - 09.6 mg/dL   GFR calc non Af Amer >90  >90 mL/min   GFR calc Af Amer >90  >90 mL/min  GLUCOSE, CAPILLARY      Component Value Range   Glucose-Capillary 292 (*) 70 - 99 mg/dL     No results found.   1. Abdominal pain   2. Nausea & vomiting   3. Gastroparesis   4. Uncontrolled diabetes mellitus   5. Hyperglycemia       MDM  21 y/o type 1 diabetic female with gastroparesis presenting with abdominal pain, n/v. Multiple ED visits for the same reason in Sept. Has appt next week with neurology for evaluation. Pain and nausea controlled with zofran and dilaudid. Labs with some improvement after  receiving fluids. She is non-compliant with her insulin. Discussed importance of medication compliance. Her heart rate and blood pressure improved. She is stable for discharge. I will give zofran for nausea. Advised her to call Dr. Loreta Ave tomorrow. Case discussed with Dr. Jeraldine Loots who agrees with plan of care.        Trevor Mace, PA-C 01/07/12 [phone]

## 2012-01-08 NOTE — ED Provider Notes (Signed)
Medical screening examination/treatment/procedure(s) were performed by non-physician practitioner and as supervising physician I was immediately available for consultation/collaboration.  Carmin Muskrat, MD 01/08/12 [phone]

## 2012-01-12 ENCOUNTER — Encounter (INDEPENDENT_AMBULATORY_CARE_PROVIDER_SITE_OTHER): Payer: 59 | Admitting: Surgery

## 2012-01-27 ENCOUNTER — Encounter (INDEPENDENT_AMBULATORY_CARE_PROVIDER_SITE_OTHER): Payer: Self-pay | Admitting: Surgery

## 2012-02-18 ENCOUNTER — Encounter (HOSPITAL_COMMUNITY): Payer: Self-pay | Admitting: *Deleted

## 2012-02-18 ENCOUNTER — Emergency Department (HOSPITAL_COMMUNITY): Payer: 59

## 2012-02-18 ENCOUNTER — Emergency Department (HOSPITAL_COMMUNITY)
Admission: EM | Admit: 2012-02-18 | Discharge: 2012-02-18 | Disposition: A | Discharge status: Home / Self Care | Payer: 59 | Attending: Emergency Medicine | Admitting: Emergency Medicine

## 2012-02-18 DIAGNOSIS — R0602 Shortness of breath: Secondary | ICD-10-CM | POA: Insufficient documentation

## 2012-02-18 DIAGNOSIS — R059 Cough, unspecified: Secondary | ICD-10-CM | POA: Insufficient documentation

## 2012-02-18 DIAGNOSIS — R739 Hyperglycemia, unspecified: Secondary | ICD-10-CM

## 2012-02-18 DIAGNOSIS — R000 Tachycardia, unspecified: Secondary | ICD-10-CM | POA: Insufficient documentation

## 2012-02-18 DIAGNOSIS — R0789 Other chest pain: Secondary | ICD-10-CM

## 2012-02-18 DIAGNOSIS — I10 Essential (primary) hypertension: Secondary | ICD-10-CM | POA: Insufficient documentation

## 2012-02-18 DIAGNOSIS — Z794 Long term (current) use of insulin: Secondary | ICD-10-CM | POA: Insufficient documentation

## 2012-02-18 DIAGNOSIS — K3184 Gastroparesis: Secondary | ICD-10-CM | POA: Insufficient documentation

## 2012-02-18 DIAGNOSIS — R05 Cough: Secondary | ICD-10-CM | POA: Insufficient documentation

## 2012-02-18 DIAGNOSIS — E109 Type 1 diabetes mellitus without complications: Secondary | ICD-10-CM | POA: Insufficient documentation

## 2012-02-18 DIAGNOSIS — Z79899 Other long term (current) drug therapy: Secondary | ICD-10-CM | POA: Insufficient documentation

## 2012-02-18 LAB — URINALYSIS, ROUTINE W REFLEX MICROSCOPIC
Bilirubin Urine: NEGATIVE
Glucose, UA: >1000 mg/dL — AB
Hgb urine dipstick: NEGATIVE
Ketones, ur: 40 mg/dL — AB
Leukocytes, UA: NEGATIVE
Nitrite: NEGATIVE
Protein, ur: 100 mg/dL — AB
Specific Gravity, Urine: 1.038 — ABNORMAL HIGH (ref 1.005–1.030)
Urobilinogen, UA: 0.2 mg/dL (ref 0.0–1.0)
pH: 6 (ref 5.0–8.0)

## 2012-02-18 LAB — COMPREHENSIVE METABOLIC PANEL
ALT: 31 U/L (ref 0–35)
AST: 26 U/L (ref 0–37)
Albumin: 4.1 g/dL (ref 3.5–5.2)
Alkaline Phosphatase: 127 U/L — ABNORMAL HIGH (ref 39–117)
BUN: 13 mg/dL (ref 6–23)
CO2: 26 mEq/L (ref 19–32)
Calcium: 10.1 mg/dL (ref 8.4–10.5)
Chloride: 89 mEq/L — ABNORMAL LOW (ref 96–112)
Creatinine, Ser: 0.53 mg/dL (ref 0.50–1.10)
GFR calc Af Amer: >90 mL/min (ref >90)
GFR calc non Af Amer: >90 mL/min (ref >90)
Glucose, Bld: 559 mg/dL (ref 70–99)
Potassium: 4.3 mEq/L (ref 3.5–5.1)
Sodium: 129 mEq/L — ABNORMAL LOW (ref 135–145)
Total Bilirubin: 0.3 mg/dL (ref 0.3–1.2)
Total Protein: 8.1 g/dL (ref 6.0–8.3)

## 2012-02-18 LAB — CBC WITH DIFFERENTIAL/PLATELET
Basophils Absolute: 0 10*3/uL (ref 0.0–0.1)
Basophils Relative: 0 % (ref 0–1)
Eosinophils Absolute: 0.1 10*3/uL (ref 0.0–0.7)
Eosinophils Relative: 1 % (ref 0–5)
HCT: 40.2 % (ref 36.0–46.0)
Hemoglobin: 14.8 g/dL (ref 12.0–15.0)
Lymphocytes Relative: 28 % (ref 12–46)
Lymphs Abs: 1.5 10*3/uL (ref 0.7–4.0)
MCH: 29.7 pg (ref 26.0–34.0)
MCHC: 36.8 g/dL — ABNORMAL HIGH (ref 30.0–36.0)
MCV: 80.7 fL (ref 78.0–100.0)
Monocytes Absolute: 0.5 10*3/uL (ref 0.1–1.0)
Monocytes Relative: 9 % (ref 3–12)
Neutro Abs: 3.2 10*3/uL (ref 1.7–7.7)
Neutrophils Relative %: 62 % (ref 43–77)
Platelets: 233 10*3/uL (ref 150–400)
RBC: 4.98 MIL/uL (ref 3.87–5.11)
RDW: 11.8 % (ref 11.5–15.5)
WBC: 5.3 10*3/uL (ref 4.0–10.5)

## 2012-02-18 LAB — GLUCOSE, CAPILLARY: Glucose-Capillary: 279 mg/dL — ABNORMAL HIGH (ref 70–99)

## 2012-02-18 LAB — URINE MICROSCOPIC-ADD ON

## 2012-02-18 LAB — TROPONIN I: Troponin I: <0.3 ng/mL (ref <0.30)

## 2012-02-18 LAB — LIPASE, BLOOD: Lipase: 29 U/L (ref 11–59)

## 2012-02-18 LAB — D-DIMER, QUANTITATIVE: D-Dimer, Quant: <0.27 ug/mL-FEU (ref 0.00–0.48)

## 2012-02-18 IMAGING — CR DG CHEST 2V
2 series · 2 of 2 positions shown · non-contrast
Comparison: [DATE] and earlier studies

CLINICAL DATA: Chest pain and cough.

CHEST - 2 VIEW

[w chest pa]
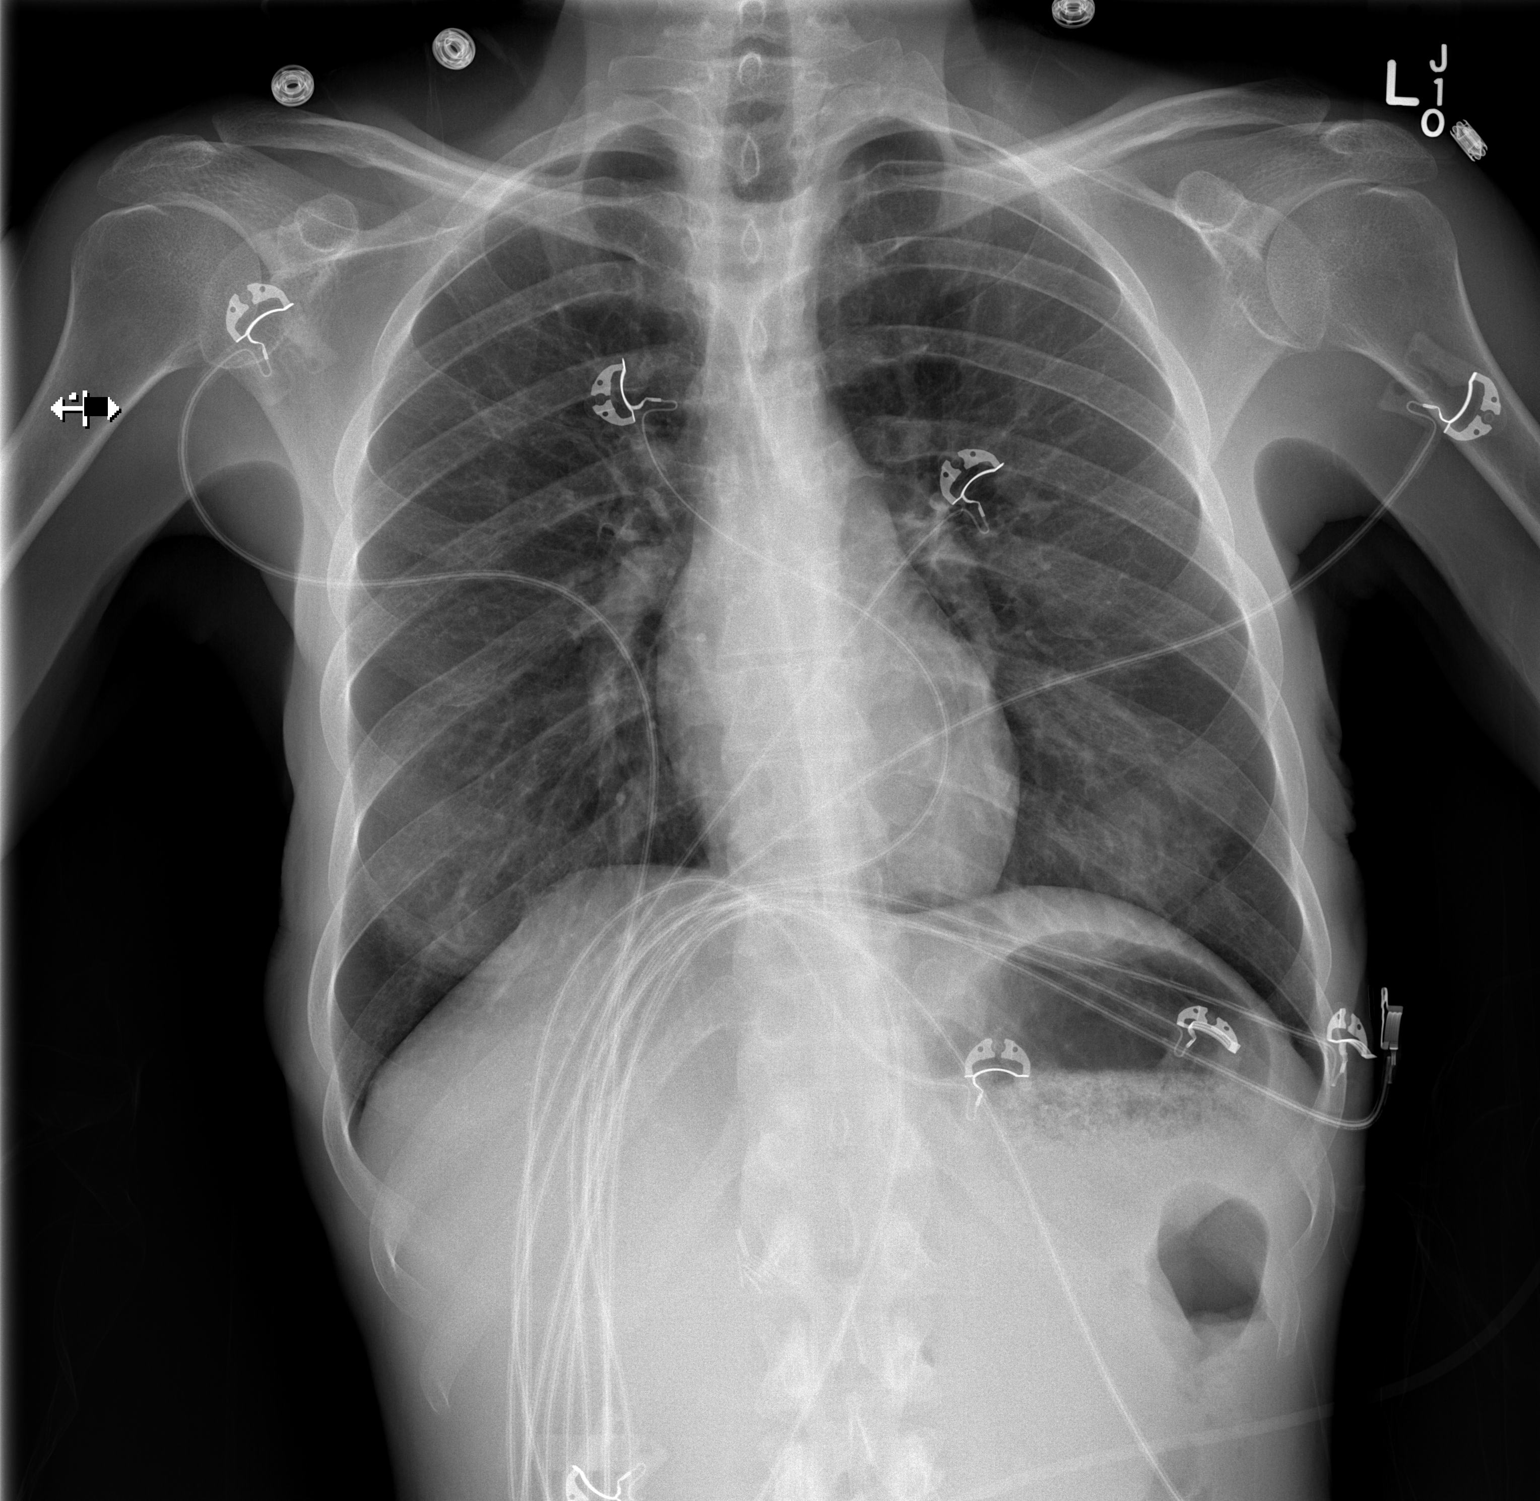

[w chest lat]
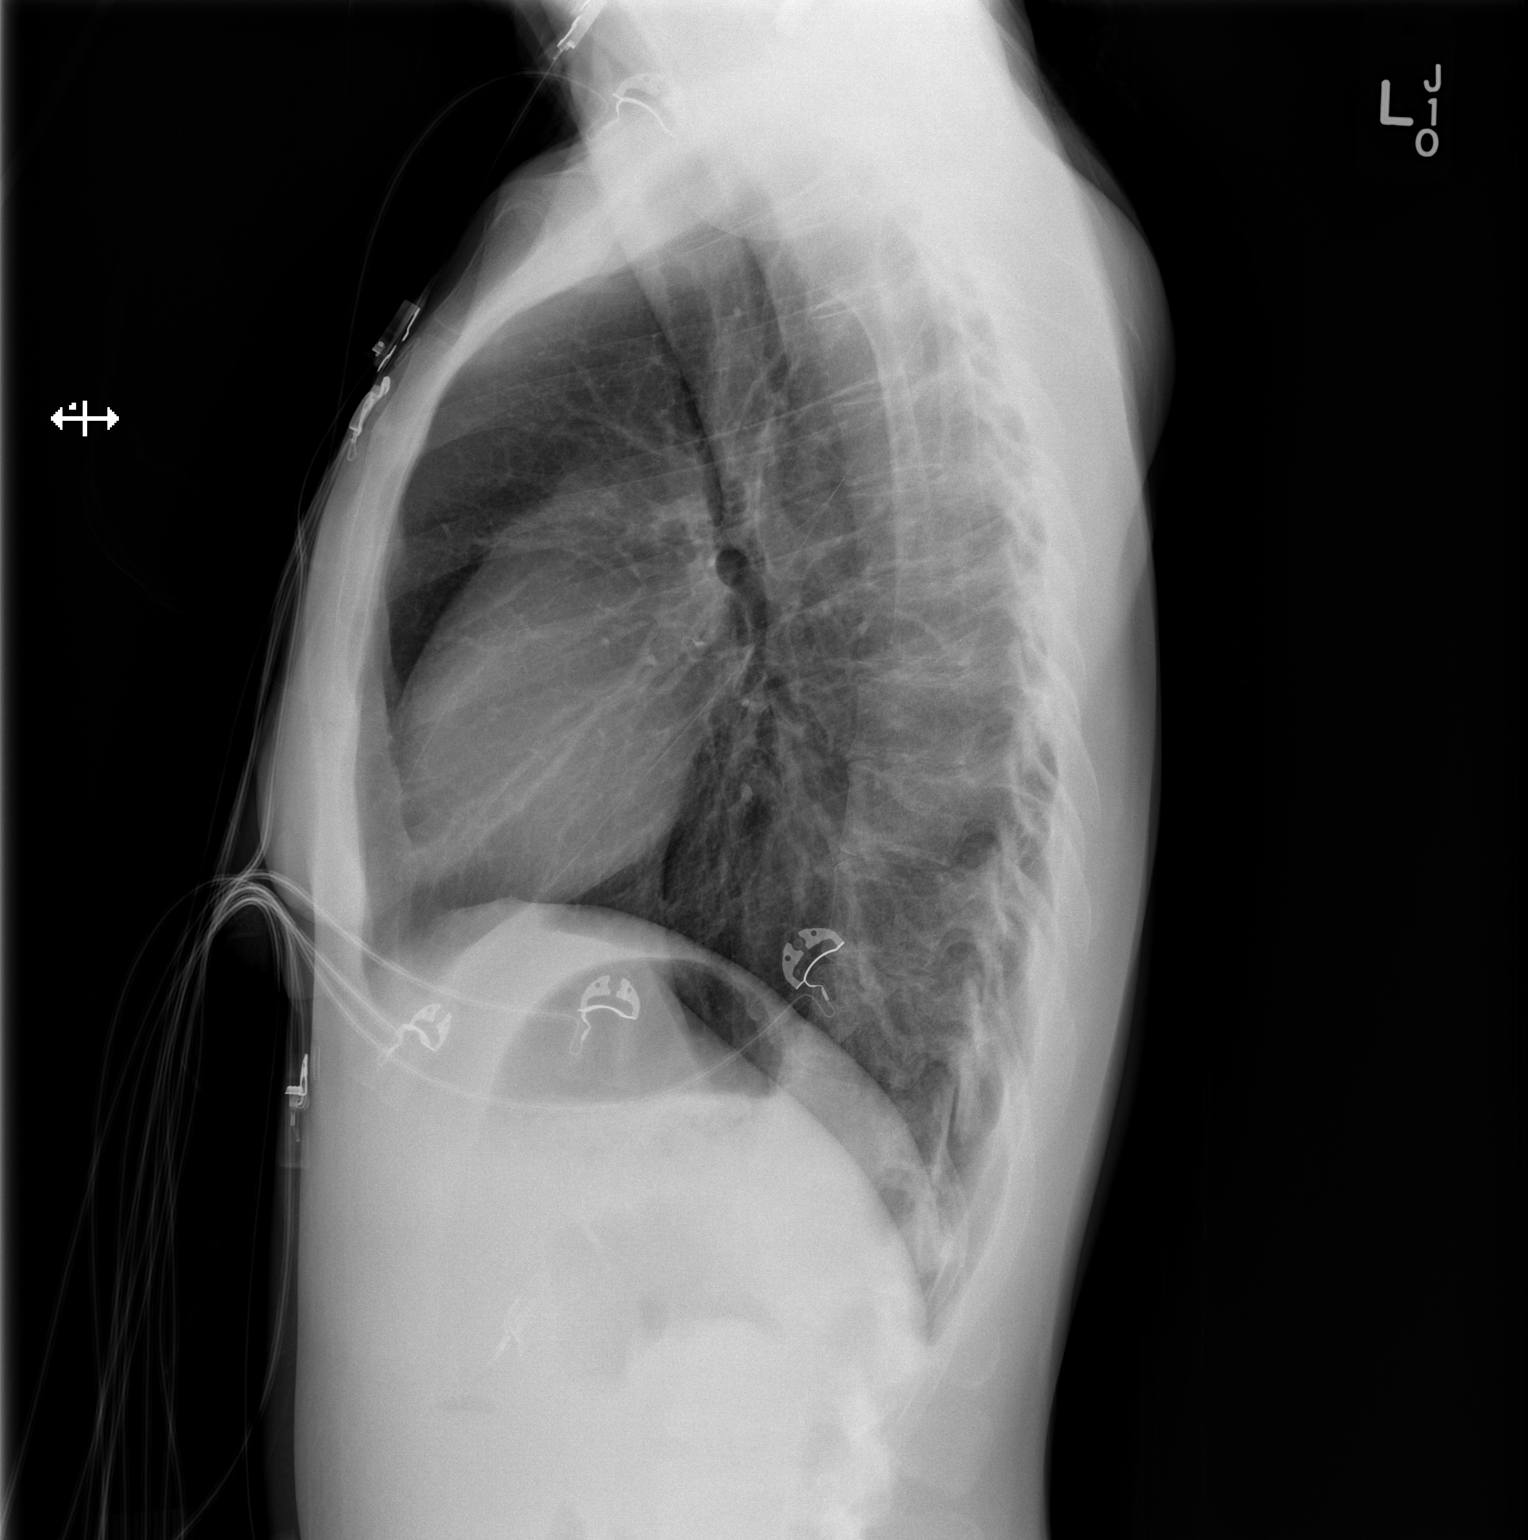

[2 of 2 positions shown; findings below may reference images not displayed]

FINDINGS: Vascular clips in the right upper abdomen. Lungs clear.
Heart size and pulmonary vascularity normal.  No effusion.
Visualized bones unremarkable.
IMPRESSION: No acute disease

## 2012-02-18 MED ORDER — SODIUM CHLORIDE 0.9 % IV BOLUS (SEPSIS)
1000.0000 mL | Freq: Once | INTRAVENOUS | Status: AC
  Administered 2012-02-18: 1000 mL via INTRAVENOUS

## 2012-02-18 MED ORDER — MORPHINE SULFATE 4 MG/ML IJ SOLN
4.0000 mg | Freq: Once | INTRAMUSCULAR | Status: AC
  Administered 2012-02-18: 4 mg via INTRAVENOUS
  Filled 2012-02-18: qty 1

## 2012-02-18 MED ORDER — ALPRAZOLAM 0.25 MG PO TABS: 0.2500 mg | ORAL_TABLET | Freq: Every evening | ORAL | Status: DC | PRN

## 2012-02-18 MED ORDER — LORAZEPAM 2 MG/ML IJ SOLN
1.0000 mg | Freq: Once | INTRAMUSCULAR | Status: AC
  Administered 2012-02-18: 1 mg via INTRAVENOUS
  Filled 2012-02-18: qty 1

## 2012-02-18 MED ORDER — INSULIN GLARGINE 100 UNIT/ML ~~LOC~~ SOLN
10.0000 [IU] | Freq: Once | SUBCUTANEOUS | Status: AC
  Administered 2012-02-18: 10 [IU] via SUBCUTANEOUS
  Filled 2012-02-18: qty 1

## 2012-02-18 NOTE — ED Provider Notes (Signed)
History  This chart was scribed for Kathryn Racer, MD by [name], ED Scribe. This patient was seen in room WA02/WA02 and the patient's care was started at 4:14 PM.  CSN: [id number]  Arrival date & time 02/18/12  1500   First MD Initiated Contact with Patient 02/18/12 1614      Chief Complaint  Patient presents with  . Chest Pain    Patient is a 21 y.o. female presenting with chest pain. The history is provided by the patient. No language interpreter was used.  Chest Pain The chest pain began 5 - 7 days ago. Chest pain occurs constantly. The chest pain is worsening. The pain is associated with coughing. The quality of the pain is described as tightness. The pain does not radiate. Chest pain is worsened by deep breathing. Primary symptoms include shortness of breath and cough. Pertinent negatives for primary symptoms include no fever, no abdominal pain, no nausea and no vomiting.  Pertinent negatives for associated symptoms include no diaphoresis and no weakness. She tried nothing for the symptoms.  Her past medical history is significant for diabetes.  Pertinent negatives for past medical history include no MI.     [name] is a 21 y.o. female who presents to the Emergency Department complaining of one week of gradual onset, gradually worsening, constant, sternal CP described as tightness with associated non-productive cough and mild SOB. She reports that the CP is worse with coughing and deep breathing. She states that the pain worsened while she was working at [employer] today but she denies any injuries or falls. She denies taking OTC medications at home to improve symptoms. She has a h/o gastroparesis and she denies similarities. She also reports recurrent myalgias but denies changes. She denies being on birth control or any recent long flights or car rides. She reports an esophagogastroduodenoscopy on September 4th, 2013 after getting "free air" trapped in her stomach  after a cholecystectomy on August 17th, 2013. She denies nausea, diaphoresis, fever and weakness as associated symptoms. She has a h/o DM and HTN and reports that she has been taking her medications as prescribed. She is an occasional alcohol user but denies smoking.  She has been seen multiple times and is usually tachycardic.   Followed by Dr. Loreta Ave for GI.   Past Medical History  Diagnosis Date  . Diabetes mellitus   . Hypertension   . Diabetes type 1, uncontrolled 11/14/2011    Since age 52  . Gastroparesis     Past Surgical History  Procedure Date  . Ankle surgery   . Cholecystectomy 11/15/2011    Procedure: LAPAROSCOPIC CHOLECYSTECTOMY WITH INTRAOPERATIVE CHOLANGIOGRAM;  Surgeon: [name], MD;  Location: [facility];  Service: General;  Laterality: N/A;  . Laparoscopy 11/23/2011    Procedure: LAPAROSCOPY DIAGNOSTIC;  Surgeon: Mariella Saa, MD;  Location: [facility];  Service: General;  Laterality: N/A;  . Esophagogastroduodenoscopy 12/03/2011    Procedure: ESOPHAGOGASTRODUODENOSCOPY (EGD);  Surgeon: Theda Belfast, MD;  Location: [facility];  Service: Endoscopy;  Laterality: N/A;    Family History  Problem Relation Age of Onset  . Diabetes Mother   . Hypertension Father     History  Substance Use Topics  . Smoking status: Never Smoker   . Smokeless tobacco: Never Used  . Alcohol Use: Yes     Comment: Occasional    No OB history provided.  Review of Systems  Constitutional: Negative for fever, chills and diaphoresis.  Respiratory: Positive for  cough and shortness of breath.   Cardiovascular: Positive for chest pain.  Gastrointestinal: Negative for nausea, vomiting and abdominal pain.  Neurological: Negative for weakness.  All other systems reviewed and are negative.    Allergies  Other  Home Medications   Current Outpatient Rx  Name  Route  Sig  Dispense  Refill  . ACYCLOVIR 400 MG PO TABS   Oral   Take 400 mg by mouth 2 (two) times daily.          . CYCLOBENZAPRINE HCL 10 MG PO TABS   Oral   Take 10 mg by mouth at bedtime.         . ENALAPRIL MALEATE 10 MG PO TABS   Oral   Take 10 mg by mouth every evening.          . INSULIN ASPART 100 UNIT/ML [place] SOLN   Subcutaneous   Inject 0-15 Units into the skin 3 (three) times daily before meals. 5 u with each meal + sliding scale: CBG 70 - 120: 0 u: CBG 121 - 150: 2 u; CBG 151 - 200: 3 u; CBG 201 - 250: 5 u; CBG 251 - 300: 8 u;CBG 301 - 350: 11 u; CBG 351 - 400: 15 u; CBG > 400 Call MD         . INSULIN GLARGINE 100 UNIT/ML [place] SOLN   Subcutaneous   Inject 30 Units into the skin at bedtime.          Marland Kitchen METOCLOPRAMIDE HCL 10 MG PO TABS   Oral   Take 10 mg by mouth 4 (four) times daily.         . ADULT MULTIVITAMIN W/MINERALS CH   Oral   Take 1 tablet by mouth daily.         Marland Kitchen OMEPRAZOLE 40 MG PO CPDR   Oral   Take 40 mg by mouth daily.         Marland Kitchen PROMETHAZINE HCL 25 MG PO TABS   Oral   Take 25 mg by mouth every 6 (six) hours as needed. For nausea         . ALPRAZOLAM 0.25 MG PO TABS   Oral   Take 1 tablet (0.25 mg total) by mouth at bedtime as needed for sleep.   30 tablet   0     Triage Vitals: BP 111/67  Pulse 140  Temp 98.1 F (36.7 C) (Oral)  Resp 18  Ht 5\' 7"  (1.702 m)  Wt 100 lb (45.36 kg)  BMI 15.66 kg/m2  SpO2 100%  LMP 09/29/2011  Physical Exam  Nursing note and vitals reviewed. Constitutional: She is oriented to person, place, and time. She appears well-developed and well-nourished. No distress.  HENT:  Head: Normocephalic and atraumatic.  Mouth/Throat: Oropharynx is clear and moist.  Eyes: Conjunctivae normal and EOM are normal. Pupils are equal, round, and reactive to light.  Neck: Normal range of motion. Neck supple. No tracheal deviation present.  Cardiovascular: Normal rate, regular rhythm and normal heart sounds.  Exam reveals no gallop and no friction rub.   No murmur heard. Pulmonary/Chest: Effort normal. No respiratory  distress. She has no wheezes. She exhibits tenderness (mild tenderness to palpation over sternum).       Pt is taking swallow breathes and avoids deep breathing, decreased breath sounds bilaterally, no crepitance over the chest  Abdominal: Soft. She exhibits no mass. There is no tenderness. There is no rebound and no guarding.  Musculoskeletal: Normal  range of motion. She exhibits no tenderness.       Calves are soft and non-tender  Neurological: She is alert and oriented to person, place, and time. No cranial nerve deficit.  Skin: Skin is warm and dry.  Psychiatric: She has a normal mood and affect. Her behavior is normal.    ED Course  Procedures (including critical care time)  DIAGNOSTIC STUDIES: Oxygen Saturation is 100% on room air, normal by my interpretation.    COORDINATION OF CARE: 4:30 PM- Discussed treatment plan which includes CXR, CBC panel and UA and with pt at bedside and pt agreed to plan.  4:45 PM- Ordered 1,000 mL of Bolus and 4 mg morphine injection.  5:45 PM- Ordered 1,000 mL of bolus  6:30 PM- Ordered 10 units of Lantus  7:00 PM- Ordered 4 mg injection of morphine  9:00 PM-Pt rechecked and reports CP has resolved. Pt is still tachycardic upon re-exam.   9:15 PM- Ordered 1,000 mL of Bolus and 1 mg injection of Ativan  Labs Reviewed  CBC WITH DIFFERENTIAL - Abnormal; Notable for the following:    MCHC 36.8 (*)  CORRECTED FOR ICTERUS   All other components within normal limits  COMPREHENSIVE METABOLIC PANEL - Abnormal; Notable for the following:    Sodium 129 (*)     Chloride 89 (*)     Glucose, Bld 559 (*)     Alkaline Phosphatase 127 (*)     All other components within normal limits  URINALYSIS, ROUTINE W REFLEX MICROSCOPIC - Abnormal; Notable for the following:    Specific Gravity, Urine 1.038 (*)     Glucose, UA >1000 (*)     Ketones, ur 40 (*)     Protein, ur 100 (*)     All other components within normal limits  GLUCOSE, CAPILLARY - Abnormal;  Notable for the following:    Glucose-Capillary 279 (*)     All other components within normal limits  LIPASE, BLOOD  D-DIMER, QUANTITATIVE  TROPONIN I  URINE MICROSCOPIC-ADD ON  URINE CULTURE   Dg Chest 2 View  02/18/2012  *RADIOLOGY REPORT*  Clinical Data: Chest pain and cough.  CHEST - 2 VIEW  Comparison: 12/04/2011 and earlier studies  Findings: Vascular clips in the right upper abdomen. Lungs clear. Heart size and pulmonary vascularity normal.  No effusion. Visualized bones unremarkable.  IMPRESSION: No acute disease   Original Report Authenticated By: D. Andria Rhein, MD      1. Atypical chest pain   2. Hyperglycemia   3. Tachycardia      Date: 02/18/2012  Rate: 144  Rhythm: sinus tachycardia  QRS Axis: normal  Intervals: normal  ST/T Wave abnormalities: normal  Conduction Disutrbances:none  Narrative Interpretation:   Old EKG Reviewed: unchanged    MDM  I personally performed the services described in this documentation, which was scribed in my presence. The recorded information has been reviewed and is accurate.  Pt now feeling much better HR at 110. Suspect some element of anxiety causing elevated HR. Encouraged to f/u with PMD and return for worsening symptoms or concerns      Kathryn Racer, MD 02/18/12 2250

## 2012-02-18 NOTE — ED Notes (Signed)
Pt c/o chest pain and shortness of breath that has been going on for 1 week, pt sts she worked today at [employer] and her chest pain increased in the center of her chest, tightness, pt sts hx of DM, denies checking her blood sugar today

## 2012-02-18 NOTE — ED Notes (Signed)
md at bedside  Pt alert and oriented x4. Respirations even and unlabored, bilateral symmetrical rise and fall of chest. Skin warm and dry. In no acute distress. Denies needs.   

## 2012-02-18 NOTE — ED Notes (Signed)
[id number]<JY> Expected date:<BR> Expected time:<BR> Means of arrival:Ambulance<BR> Comments:<BR> EMS

## 2012-02-19 LAB — URINE CULTURE: Colony Count: 45000

## 2012-02-22 ENCOUNTER — Emergency Department (HOSPITAL_COMMUNITY)
Admission: EM | Admit: 2012-02-22 | Discharge: 2012-02-23 | Disposition: A | Discharge status: Home / Self Care | Payer: 59 | Attending: Emergency Medicine | Admitting: Emergency Medicine

## 2012-02-22 ENCOUNTER — Encounter (HOSPITAL_COMMUNITY): Payer: Self-pay | Admitting: Emergency Medicine

## 2012-02-22 DIAGNOSIS — K3184 Gastroparesis: Secondary | ICD-10-CM | POA: Insufficient documentation

## 2012-02-22 DIAGNOSIS — R110 Nausea: Secondary | ICD-10-CM

## 2012-02-22 DIAGNOSIS — I10 Essential (primary) hypertension: Secondary | ICD-10-CM | POA: Insufficient documentation

## 2012-02-22 DIAGNOSIS — E1065 Type 1 diabetes mellitus with hyperglycemia: Secondary | ICD-10-CM | POA: Insufficient documentation

## 2012-02-22 DIAGNOSIS — IMO0002 Reserved for concepts with insufficient information to code with codable children: Secondary | ICD-10-CM | POA: Insufficient documentation

## 2012-02-22 DIAGNOSIS — R000 Tachycardia, unspecified: Secondary | ICD-10-CM

## 2012-02-22 DIAGNOSIS — Z79899 Other long term (current) drug therapy: Secondary | ICD-10-CM | POA: Insufficient documentation

## 2012-02-22 DIAGNOSIS — G479 Sleep disorder, unspecified: Secondary | ICD-10-CM | POA: Insufficient documentation

## 2012-02-22 DIAGNOSIS — Z794 Long term (current) use of insulin: Secondary | ICD-10-CM | POA: Insufficient documentation

## 2012-02-22 DIAGNOSIS — R739 Hyperglycemia, unspecified: Secondary | ICD-10-CM

## 2012-02-22 LAB — CBC WITH DIFFERENTIAL/PLATELET
Basophils Absolute: 0 10*3/uL (ref 0.0–0.1)
Basophils Relative: 0 % (ref 0–1)
Eosinophils Absolute: 0 10*3/uL (ref 0.0–0.7)
Eosinophils Relative: 0 % (ref 0–5)
HCT: 40.6 % (ref 36.0–46.0)
Hemoglobin: 15 g/dL (ref 12.0–15.0)
Lymphocytes Relative: 40 % (ref 12–46)
Lymphs Abs: 2.2 10*3/uL (ref 0.7–4.0)
MCH: 30.3 pg (ref 26.0–34.0)
MCHC: 36.9 g/dL — ABNORMAL HIGH (ref 30.0–36.0)
MCV: 82 fL (ref 78.0–100.0)
Monocytes Absolute: 0.2 10*3/uL (ref 0.1–1.0)
Monocytes Relative: 4 % (ref 3–12)
Neutro Abs: 3.1 10*3/uL (ref 1.7–7.7)
Neutrophils Relative %: 56 % (ref 43–77)
Platelets: 293 10*3/uL (ref 150–400)
RBC: 4.95 MIL/uL (ref 3.87–5.11)
RDW: 11.8 % (ref 11.5–15.5)
WBC: 5.6 10*3/uL (ref 4.0–10.5)

## 2012-02-22 LAB — COMPREHENSIVE METABOLIC PANEL
ALT: 37 U/L — ABNORMAL HIGH (ref 0–35)
AST: 22 U/L (ref 0–37)
Albumin: 4.6 g/dL (ref 3.5–5.2)
Alkaline Phosphatase: 126 U/L — ABNORMAL HIGH (ref 39–117)
BUN: 13 mg/dL (ref 6–23)
CO2: 23 mEq/L (ref 19–32)
Calcium: 10.3 mg/dL (ref 8.4–10.5)
Chloride: 87 mEq/L — ABNORMAL LOW (ref 96–112)
Creatinine, Ser: 0.42 mg/dL — ABNORMAL LOW (ref 0.50–1.10)
GFR calc Af Amer: >90 mL/min (ref >90)
GFR calc non Af Amer: >90 mL/min (ref >90)
Glucose, Bld: 470 mg/dL — ABNORMAL HIGH (ref 70–99)
Potassium: 3.9 mEq/L (ref 3.5–5.1)
Sodium: 128 mEq/L — ABNORMAL LOW (ref 135–145)
Total Bilirubin: 0.7 mg/dL (ref 0.3–1.2)
Total Protein: 8.6 g/dL — ABNORMAL HIGH (ref 6.0–8.3)

## 2012-02-22 LAB — GLUCOSE, CAPILLARY
Glucose-Capillary: 509 mg/dL — ABNORMAL HIGH (ref 70–99)
Glucose-Capillary: 373 mg/dL — ABNORMAL HIGH (ref 70–99)
Glucose-Capillary: 263 mg/dL — ABNORMAL HIGH (ref 70–99)

## 2012-02-22 MED ORDER — INSULIN ASPART 100 UNIT/ML ~~LOC~~ SOLN
7.0000 [IU] | Freq: Once | SUBCUTANEOUS | Status: AC
  Administered 2012-02-22: 7 [IU] via SUBCUTANEOUS
  Filled 2012-02-22: qty 7

## 2012-02-22 MED ORDER — SODIUM CHLORIDE 0.9 % IV SOLN
Freq: Once | INTRAVENOUS | Status: AC
  Administered 2012-02-22: 21:00:00 via INTRAVENOUS

## 2012-02-22 MED ORDER — METOCLOPRAMIDE HCL 5 MG/ML IJ SOLN
10.0000 mg | Freq: Once | INTRAMUSCULAR | Status: AC
  Administered 2012-02-22: 10 mg via INTRAVENOUS
  Filled 2012-02-22: qty 2

## 2012-02-22 MED ORDER — INSULIN ASPART 100 UNIT/ML ~~LOC~~ SOLN
10.0000 [IU] | Freq: Once | SUBCUTANEOUS | Status: AC
  Administered 2012-02-22: 10 [IU] via SUBCUTANEOUS
  Filled 2012-02-22: qty 10

## 2012-02-22 MED ORDER — INSULIN REGULAR HUMAN 100 UNIT/ML IJ SOLN: 7.0000 [IU] | Freq: Once | INTRAMUSCULAR | Status: DC

## 2012-02-22 MED ORDER — GI COCKTAIL ~~LOC~~
30.0000 mL | Freq: Once | ORAL | Status: AC
  Administered 2012-02-22: 30 mL via ORAL
  Filled 2012-02-22: qty 30

## 2012-02-22 MED ORDER — SODIUM CHLORIDE 0.9 % IV SOLN: Freq: Once | INTRAVENOUS | Status: DC

## 2012-02-22 NOTE — ED Provider Notes (Signed)
History     CSN: [id number]  Arrival date & time 02/22/12  8657   First MD Initiated Contact with Patient 02/22/12 2003      Chief Complaint  Patient presents with  . Abdominal Pain    (Consider location/radiation/quality/duration/timing/severity/associated sxs/prior treatment) HPI Comments: At work today developed epigastric pain, nausea   Did not take any of her medications nor OTC meds for burning sensation in epigastrium.   Has not checked BS recently due to lack of strips and funds   Patient is a 21 y.o. female presenting with abdominal pain. The history is provided by the patient.  Abdominal Pain The primary symptoms of the illness include abdominal pain and nausea. The primary symptoms of the illness do not include shortness of breath or vomiting. The current episode started 3 to 5 hours ago. The onset of the illness was gradual. The problem has not changed since onset. Nausea began today.  The patient states that she believes she is currently not pregnant. The patient has not had a change in bowel habit. Symptoms associated with the illness do not include chills, constipation, urgency, frequency or back pain. Significant associated medical issues include diabetes.    Past Medical History  Diagnosis Date  . Diabetes mellitus   . Hypertension   . Diabetes type 1, uncontrolled 11/14/2011    Since age 18  . Gastroparesis     Past Surgical History  Procedure Date  . Ankle surgery   . Cholecystectomy 11/15/2011    Procedure: LAPAROSCOPIC CHOLECYSTECTOMY WITH INTRAOPERATIVE CHOLANGIOGRAM;  Surgeon: [name], MD;  Location: [facility];  Service: General;  Laterality: N/A;  . Laparoscopy 11/23/2011    Procedure: LAPAROSCOPY DIAGNOSTIC;  Surgeon: Mariella Saa, MD;  Location: [facility];  Service: General;  Laterality: N/A;  . Esophagogastroduodenoscopy 12/03/2011    Procedure: ESOPHAGOGASTRODUODENOSCOPY (EGD);  Surgeon: Theda Belfast, MD;  Location: [facility];  Service:  Endoscopy;  Laterality: N/A;    Family History  Problem Relation Age of Onset  . Diabetes Mother   . Hypertension Father     History  Substance Use Topics  . Smoking status: Never Smoker   . Smokeless tobacco: Never Used  . Alcohol Use: Yes     Comment: Occasional    OB History    Grav Para Term Preterm Abortions TAB SAB Ect Mult Living                  Review of Systems  Constitutional: Negative for chills.  Respiratory: Negative for shortness of breath.   Gastrointestinal: Positive for nausea and abdominal pain. Negative for vomiting and constipation.  Genitourinary: Negative for urgency and frequency.  Musculoskeletal: Negative for back pain.  Neurological: Negative for dizziness and weakness.    Allergies  Other  Home Medications   Current Outpatient Rx  Name  Route  Sig  Dispense  Refill  . ACYCLOVIR 400 MG PO TABS   Oral   Take 400 mg by mouth 2 (two) times daily.         Marland Kitchen ALPRAZOLAM 0.25 MG PO TABS   Oral   Take 1 tablet (0.25 mg total) by mouth at bedtime as needed for sleep.   30 tablet   0   . CYCLOBENZAPRINE HCL 10 MG PO TABS   Oral   Take 10 mg by mouth at bedtime.         . ENALAPRIL MALEATE 10 MG PO TABS   Oral   Take 10 mg  by mouth every evening.          . INSULIN ASPART 100 UNIT/ML [place] SOLN   Subcutaneous   Inject 0-15 Units into the skin 3 (three) times daily before meals. 5 u with each meal + sliding scale: CBG 70 - 120: 0 u: CBG 121 - 150: 2 u; CBG 151 - 200: 3 u; CBG 201 - 250: 5 u; CBG 251 - 300: 8 u;CBG 301 - 350: 11 u; CBG 351 - 400: 15 u; CBG > 400 Call MD         . INSULIN GLARGINE 100 UNIT/ML [place] SOLN   Subcutaneous   Inject 30 Units into the skin at bedtime.          Marland Kitchen METOCLOPRAMIDE HCL 10 MG PO TABS   Oral   Take 10 mg by mouth 4 (four) times daily.         . ADULT MULTIVITAMIN W/MINERALS CH   Oral   Take 1 tablet by mouth daily.         Marland Kitchen OMEPRAZOLE 40 MG PO CPDR   Oral   Take 40 mg by mouth  daily.         Marland Kitchen PROMETHAZINE HCL 25 MG PO TABS   Oral   Take 25 mg by mouth every 6 (six) hours as needed. For nausea           BP 136/89  Pulse 126  Temp 98.2 F (36.8 C) (Oral)  Resp 18  SpO2 99%  LMP 09/29/2011  Physical Exam  Constitutional: She is oriented to person, place, and time. She appears well-developed.  HENT:  Head: Normocephalic.  Eyes: Pupils are equal, round, and reactive to light.  Neck: Normal range of motion.  Cardiovascular: Tachycardia present.   Pulmonary/Chest: Effort normal.  Abdominal: She exhibits no distension. There is no tenderness.  Musculoskeletal: Normal range of motion.  Neurological: She is alert and oriented to person, place, and time.  Skin: Skin is warm. No rash noted. No erythema.    ED Course  Procedures (including critical care time)  Labs Reviewed  CBC WITH DIFFERENTIAL - Abnormal; Notable for the following:    MCHC 36.9 (*)     All other components within normal limits  COMPREHENSIVE METABOLIC PANEL - Abnormal; Notable for the following:    Sodium 128 (*)     Chloride 87 (*)     Glucose, Bld 470 (*)     Creatinine, Ser 0.42 (*)     Total Protein 8.6 (*)     ALT 37 (*)     Alkaline Phosphatase 126 (*)     All other components within normal limits  GLUCOSE, CAPILLARY - Abnormal; Notable for the following:    Glucose-Capillary 509 (*)     All other components within normal limits  GLUCOSE, CAPILLARY - Abnormal; Notable for the following:    Glucose-Capillary 373 (*)     All other components within normal limits  GLUCOSE, CAPILLARY - Abnormal; Notable for the following:    Glucose-Capillary 263 (*)     All other components within normal limits  GLUCOSE, CAPILLARY - Abnormal; Notable for the following:    Glucose-Capillary 124 (*)     All other components within normal limits   No results found.   1. Hyperglycemia without ketosis   2. Tachycardia   3. Nausea       MDM  BS 509 will hydrate check labs treat  pain   Blood sugar now  124 but still tachycardiac which has been documented on several other ED visits and is persistent       Arman Filter, NP 02/23/12 0117  Arman Filter, NP 02/23/12 0102

## 2012-02-22 NOTE — ED Notes (Signed)
Pt states she is having upper abd pain that she describes as burning and she is having nausea  Pt states it started about 4pm this afternoon  Pt states she has had some diarrhea today but no vomiting  Pt states she has gastroparesis

## 2012-02-23 LAB — GLUCOSE, CAPILLARY: Glucose-Capillary: 124 mg/dL — ABNORMAL HIGH (ref 70–99)

## 2012-02-23 MED ORDER — LORAZEPAM 1 MG PO TABS
1.0000 mg | ORAL_TABLET | Freq: Once | ORAL | Status: AC
  Administered 2012-02-23: 1 mg via ORAL

## 2012-02-25 NOTE — ED Provider Notes (Signed)
Medical screening examination/treatment/procedure(s) were performed by non-physician practitioner and as supervising physician I was immediately available for consultation/collaboration. [place], MD, FACEP'   Reis Pienta L Jassmin Kemmerer, MD 02/25/12 1507 

## 2012-02-28 ENCOUNTER — Inpatient Hospital Stay (HOSPITAL_COMMUNITY): Payer: 59

## 2012-02-28 ENCOUNTER — Encounter (HOSPITAL_COMMUNITY): Payer: Self-pay | Admitting: Emergency Medicine

## 2012-02-28 ENCOUNTER — Inpatient Hospital Stay (HOSPITAL_COMMUNITY)
Admission: EM | Admit: 2012-02-28 | Discharge: 2012-03-01 | DRG: 638 | Disposition: A | Discharge status: Home / Self Care | Payer: 59 | Attending: Internal Medicine | Admitting: Internal Medicine

## 2012-02-28 ENCOUNTER — Emergency Department (HOSPITAL_COMMUNITY): Payer: 59

## 2012-02-28 DIAGNOSIS — Z79899 Other long term (current) drug therapy: Secondary | ICD-10-CM

## 2012-02-28 DIAGNOSIS — I498 Other specified cardiac arrhythmias: Secondary | ICD-10-CM | POA: Diagnosis present

## 2012-02-28 DIAGNOSIS — E860 Dehydration: Secondary | ICD-10-CM

## 2012-02-28 DIAGNOSIS — K5900 Constipation, unspecified: Secondary | ICD-10-CM

## 2012-02-28 DIAGNOSIS — K6289 Other specified diseases of anus and rectum: Secondary | ICD-10-CM

## 2012-02-28 DIAGNOSIS — R109 Unspecified abdominal pain: Secondary | ICD-10-CM

## 2012-02-28 DIAGNOSIS — I152 Hypertension secondary to endocrine disorders: Secondary | ICD-10-CM

## 2012-02-28 DIAGNOSIS — E1159 Type 2 diabetes mellitus with other circulatory complications: Secondary | ICD-10-CM

## 2012-02-28 DIAGNOSIS — E1065 Type 1 diabetes mellitus with hyperglycemia: Secondary | ICD-10-CM

## 2012-02-28 DIAGNOSIS — E873 Alkalosis: Secondary | ICD-10-CM

## 2012-02-28 DIAGNOSIS — E1165 Type 2 diabetes mellitus with hyperglycemia: Secondary | ICD-10-CM | POA: Diagnosis present

## 2012-02-28 DIAGNOSIS — R079 Chest pain, unspecified: Secondary | ICD-10-CM | POA: Diagnosis present

## 2012-02-28 DIAGNOSIS — R0609 Other forms of dyspnea: Secondary | ICD-10-CM

## 2012-02-28 DIAGNOSIS — R739 Hyperglycemia, unspecified: Secondary | ICD-10-CM | POA: Diagnosis present

## 2012-02-28 DIAGNOSIS — I10 Essential (primary) hypertension: Secondary | ICD-10-CM | POA: Diagnosis present

## 2012-02-28 DIAGNOSIS — R000 Tachycardia, unspecified: Secondary | ICD-10-CM

## 2012-02-28 DIAGNOSIS — E1142 Type 2 diabetes mellitus with diabetic polyneuropathy: Secondary | ICD-10-CM | POA: Diagnosis present

## 2012-02-28 DIAGNOSIS — R0989 Other specified symptoms and signs involving the circulatory and respiratory systems: Secondary | ICD-10-CM

## 2012-02-28 DIAGNOSIS — IMO0001 Reserved for inherently not codable concepts without codable children: Secondary | ICD-10-CM | POA: Diagnosis present

## 2012-02-28 DIAGNOSIS — E1100 Type 2 diabetes mellitus with hyperosmolarity without nonketotic hyperglycemic-hyperosmolar coma (NKHHC): Secondary | ICD-10-CM

## 2012-02-28 DIAGNOSIS — Z91018 Allergy to other foods: Secondary | ICD-10-CM

## 2012-02-28 DIAGNOSIS — R7309 Other abnormal glucose: Secondary | ICD-10-CM

## 2012-02-28 DIAGNOSIS — IMO0002 Reserved for concepts with insufficient information to code with codable children: Principal | ICD-10-CM | POA: Diagnosis present

## 2012-02-28 DIAGNOSIS — Z794 Long term (current) use of insulin: Secondary | ICD-10-CM

## 2012-02-28 DIAGNOSIS — E871 Hypo-osmolality and hyponatremia: Secondary | ICD-10-CM | POA: Diagnosis present

## 2012-02-28 DIAGNOSIS — Z23 Encounter for immunization: Secondary | ICD-10-CM

## 2012-02-28 DIAGNOSIS — K3184 Gastroparesis: Secondary | ICD-10-CM | POA: Diagnosis present

## 2012-02-28 DIAGNOSIS — R0600 Dyspnea, unspecified: Secondary | ICD-10-CM | POA: Diagnosis present

## 2012-02-28 LAB — GLUCOSE, CAPILLARY
Glucose-Capillary: 430 mg/dL — ABNORMAL HIGH (ref 70–99)
Glucose-Capillary: 379 mg/dL — ABNORMAL HIGH (ref 70–99)
Glucose-Capillary: 234 mg/dL — ABNORMAL HIGH (ref 70–99)
Glucose-Capillary: 232 mg/dL — ABNORMAL HIGH (ref 70–99)
Glucose-Capillary: 189 mg/dL — ABNORMAL HIGH (ref 70–99)
Glucose-Capillary: 150 mg/dL — ABNORMAL HIGH (ref 70–99)
Glucose-Capillary: 97 mg/dL (ref 70–99)
Glucose-Capillary: 260 mg/dL — ABNORMAL HIGH (ref 70–99)
Glucose-Capillary: 187 mg/dL — ABNORMAL HIGH (ref 70–99)
Glucose-Capillary: 101 mg/dL — ABNORMAL HIGH (ref 70–99)
Glucose-Capillary: 78 mg/dL (ref 70–99)

## 2012-02-28 LAB — BASIC METABOLIC PANEL
BUN: 17 mg/dL (ref 6–23)
CO2: 19 mEq/L (ref 19–32)
Calcium: 10.4 mg/dL (ref 8.4–10.5)
Chloride: 84 mEq/L — ABNORMAL LOW (ref 96–112)
Creatinine, Ser: 0.51 mg/dL (ref 0.50–1.10)
GFR calc Af Amer: >90 mL/min (ref >90)
GFR calc non Af Amer: >90 mL/min (ref >90)
Glucose, Bld: 727 mg/dL (ref 70–99)
Potassium: 4.7 mEq/L (ref 3.5–5.1)
Sodium: 125 mEq/L — ABNORMAL LOW (ref 135–145)

## 2012-02-28 LAB — RAPID URINE DRUG SCREEN, HOSP PERFORMED
Amphetamines: NOT DETECTED
Barbiturates: NOT DETECTED
Benzodiazepines: NOT DETECTED
Cocaine: NOT DETECTED
Opiates: NOT DETECTED
Tetrahydrocannabinol: NOT DETECTED

## 2012-02-28 LAB — CBC
HCT: 41.6 % (ref 36.0–46.0)
Hemoglobin: 14.6 g/dL (ref 12.0–15.0)
MCH: 29.7 pg (ref 26.0–34.0)
MCHC: 35.1 g/dL (ref 30.0–36.0)
MCV: 84.6 fL (ref 78.0–100.0)
Platelets: 302 10*3/uL (ref 150–400)
RBC: 4.92 MIL/uL (ref 3.87–5.11)
RDW: 12.3 % (ref 11.5–15.5)
WBC: 4.3 10*3/uL (ref 4.0–10.5)

## 2012-02-28 LAB — HEPATIC FUNCTION PANEL
ALT: 27 U/L (ref 0–35)
AST: 15 U/L (ref 0–37)
Albumin: 3.2 g/dL — ABNORMAL LOW (ref 3.5–5.2)
Alkaline Phosphatase: 81 U/L (ref 39–117)
Bilirubin, Direct: 0.1 mg/dL (ref 0.0–0.3)
Indirect Bilirubin: 0.3 mg/dL (ref 0.3–0.9)
Total Bilirubin: 0.4 mg/dL (ref 0.3–1.2)
Total Protein: 6.7 g/dL (ref 6.0–8.3)

## 2012-02-28 LAB — TSH: TSH: 0.467 u[IU]/mL (ref 0.350–4.500)

## 2012-02-28 LAB — HEMOGLOBIN A1C
Hgb A1c MFr Bld: 10.8 % — ABNORMAL HIGH (ref <5.7)
Mean Plasma Glucose: 263 mg/dL — ABNORMAL HIGH (ref <117)

## 2012-02-28 LAB — POCT PREGNANCY, URINE: Preg Test, Ur: NEGATIVE

## 2012-02-28 LAB — D-DIMER, QUANTITATIVE: D-Dimer, Quant: <0.27 ug/mL-FEU (ref 0.00–0.48)

## 2012-02-28 LAB — MRSA PCR SCREENING: MRSA by PCR: INVALID — AB

## 2012-02-28 LAB — TROPONIN I: Troponin I: <0.3 ng/mL (ref <0.30)

## 2012-02-28 LAB — ETHANOL: Alcohol, Ethyl (B): <11 mg/dL (ref 0–11)

## 2012-02-28 IMAGING — CR DG CHEST 1V PORT
1 series · 1 of 1 positions shown · non-contrast
Comparison: Two-view chest [DATE].

CLINICAL DATA: Chest pain.  Shortness of breath.

PORTABLE CHEST - 1 VIEW

[AP]
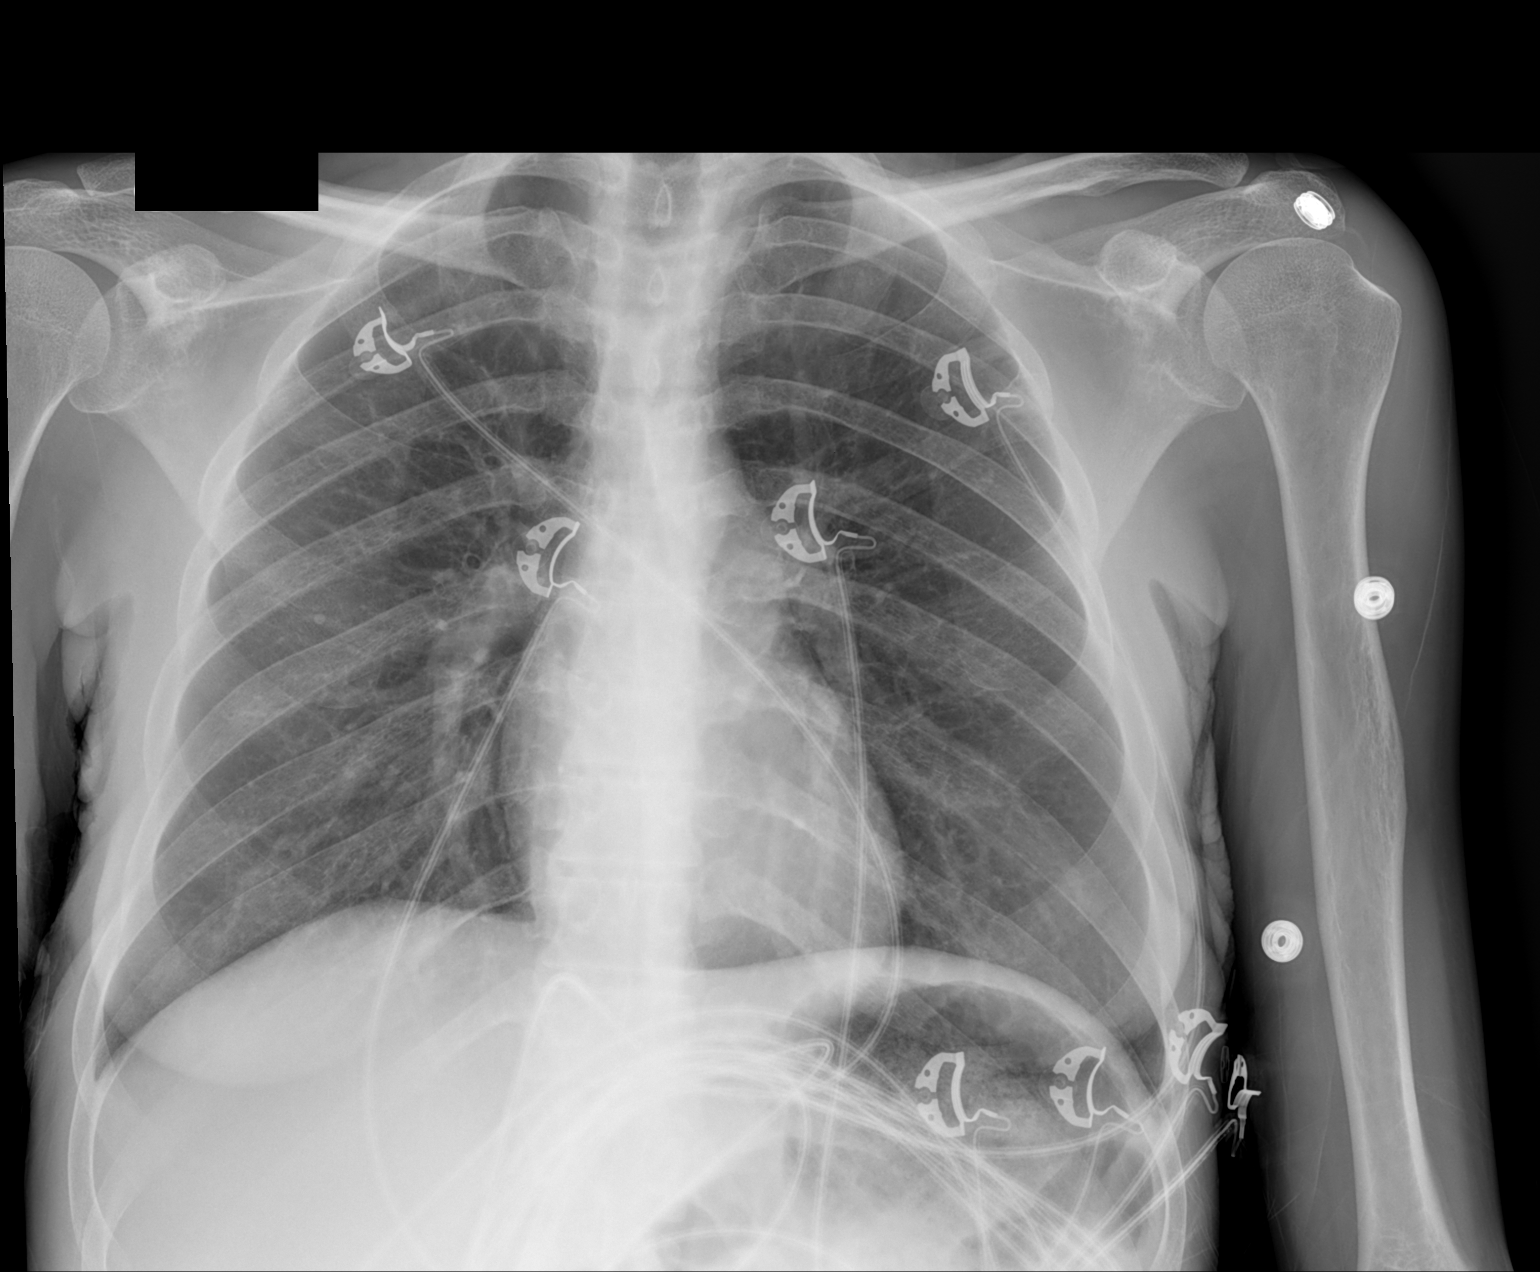

[1 of 1 positions shown; findings below may reference images not displayed]

FINDINGS: The heart size is normal.  The lungs are clear.  The
visualized soft tissues and bony thorax are unremarkable.
IMPRESSION: Negative chest.

## 2012-02-28 IMAGING — CT CT ANGIO CHEST
1 of 2 series · 20 of 32 positions shown · IV contrast (OMNIPAQUE 300)
Comparison: Chest x-ray dated [DATE] and CT angiograms dated
[DATE] and [DATE]

CLINICAL DATA: Tachycardia.  Chest pain.  Shortness of breath.

CT ANGIOGRAPHY CHEST
TECHNIQUE: Multidetector CT imaging of the chest using the
standard protocol during bolus administration of intravenous
contrast. Multiplanar reconstructed images including MIPs were
obtained and reviewed to evaluate the vascular anatomy.
Contrast: 100mL OMNIPAQUE IOHEXOL 350 MG/ML SOLN

[Series 7: thins for pacs · axial · 0.59mm/px · z∈[+1270,+1486]mm · 20 of 238 slices shown]
[im 11/238  lung]
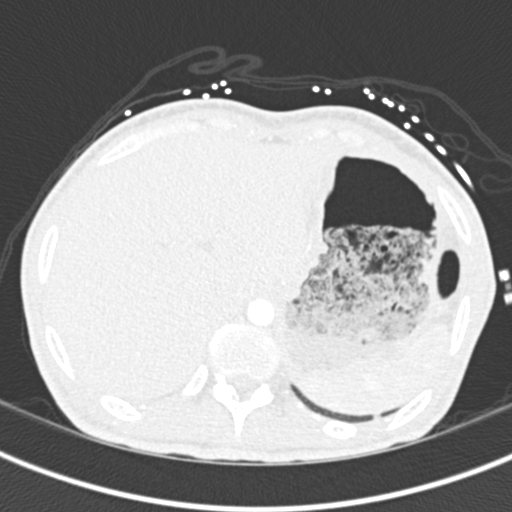
[im 21/238  soft-tissue]
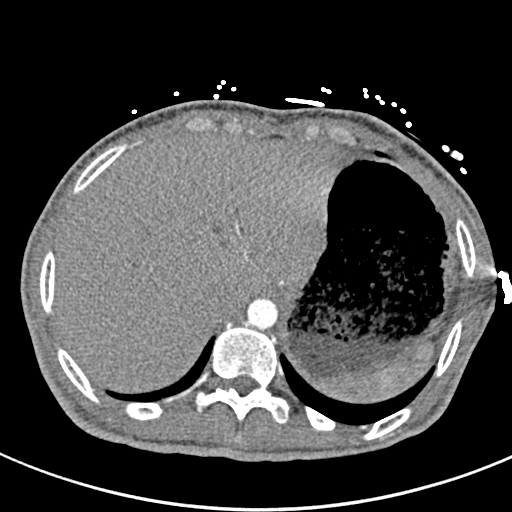
[im 31/238  lung]
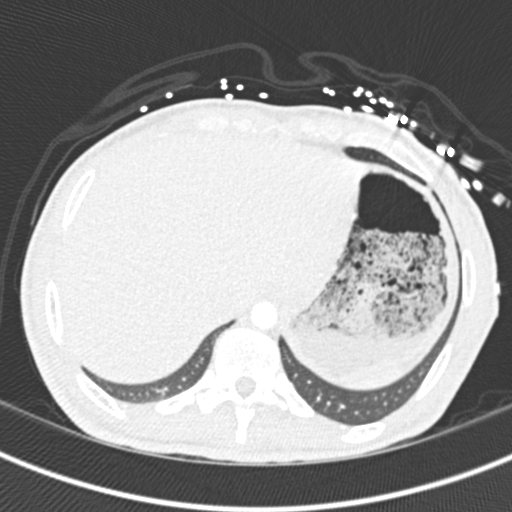
[im 42/238  soft-tissue]
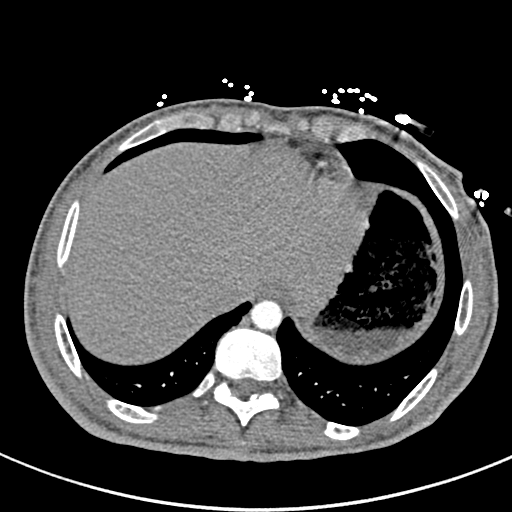
[im 52/238  lung]
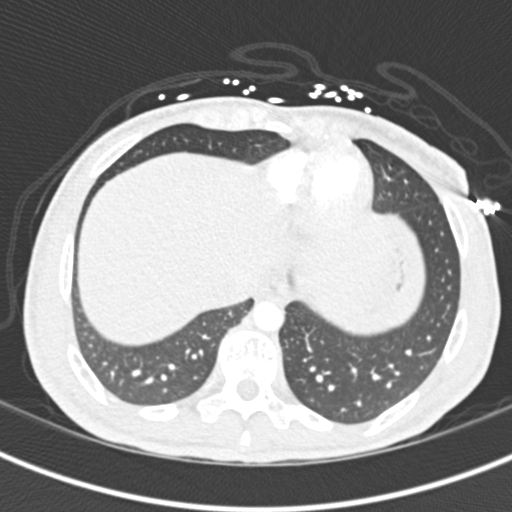
[im 73/238  soft-tissue]
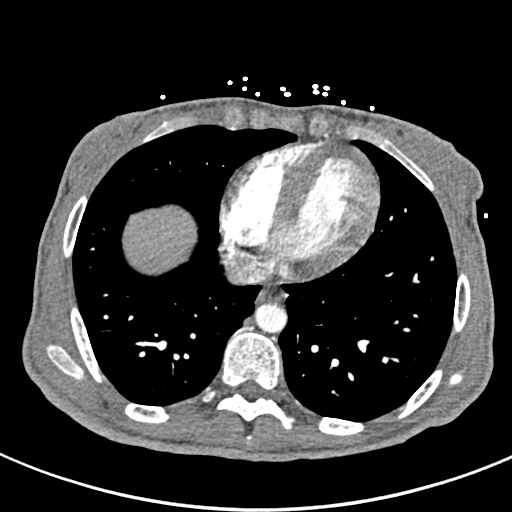
[im 83/238  lung]
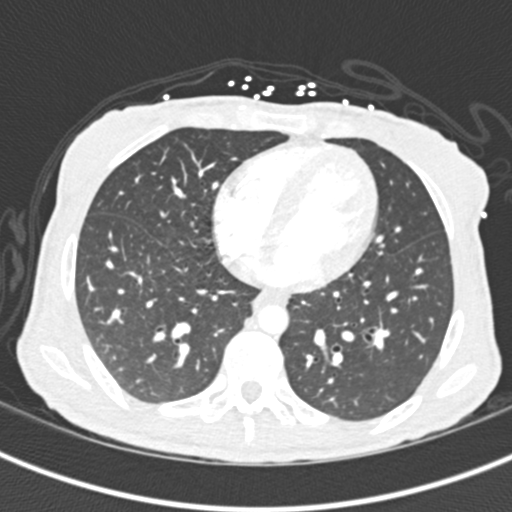
[im 93/238  soft-tissue]
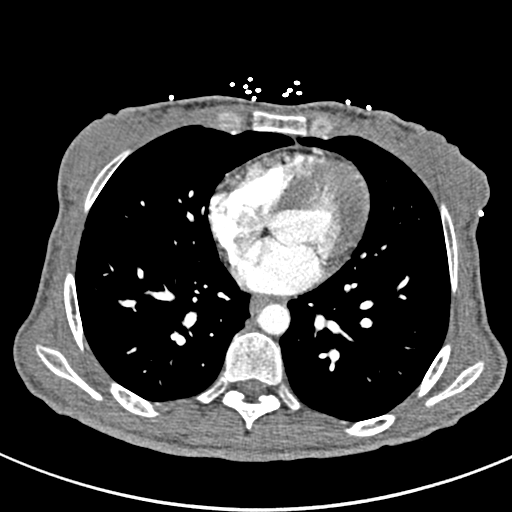
[im 104/238  lung]
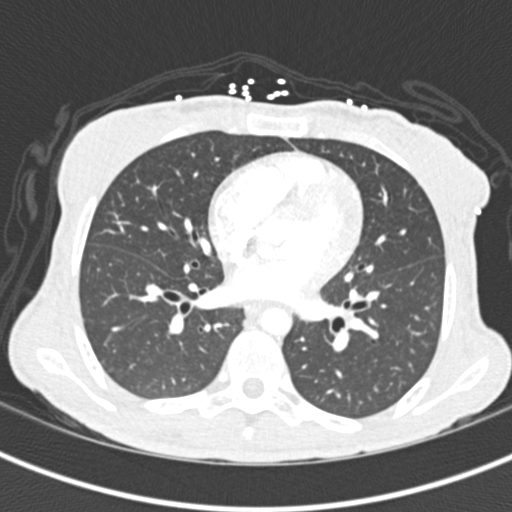
[im 114/238  soft-tissue]
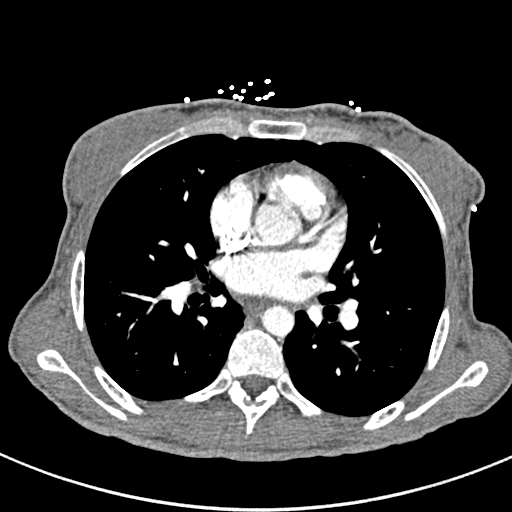
[im 124/238  lung]
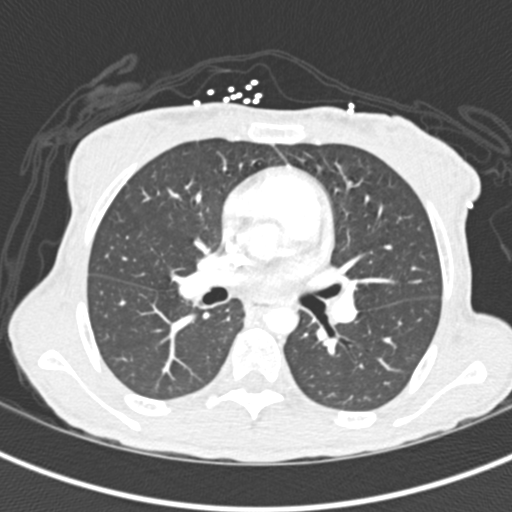
[im 134/238  soft-tissue]
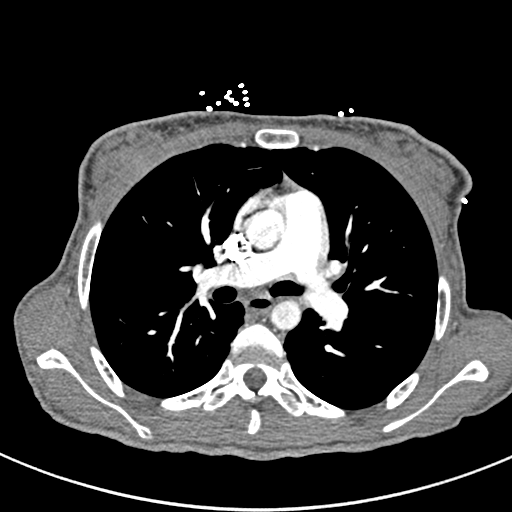
[im 145/238  lung]
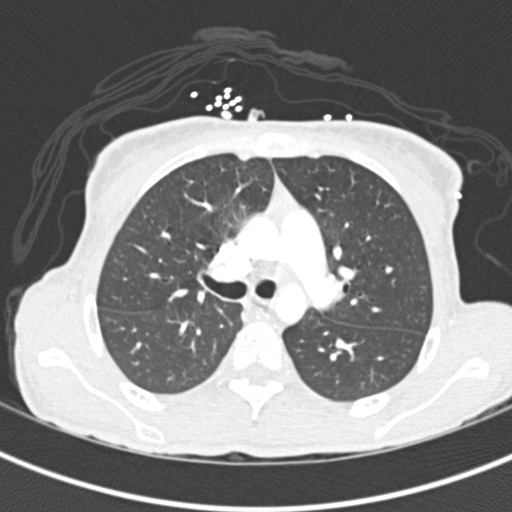
[im 155/238  soft-tissue]
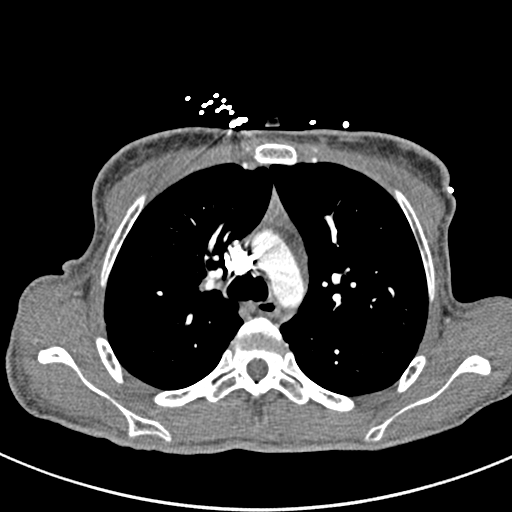
[im 165/238  lung]
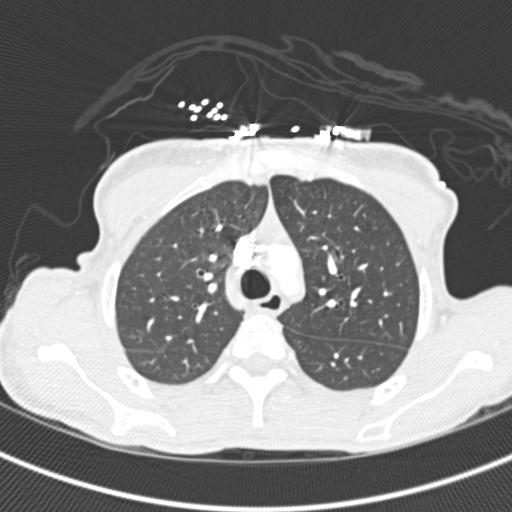
[im 186/238  soft-tissue]
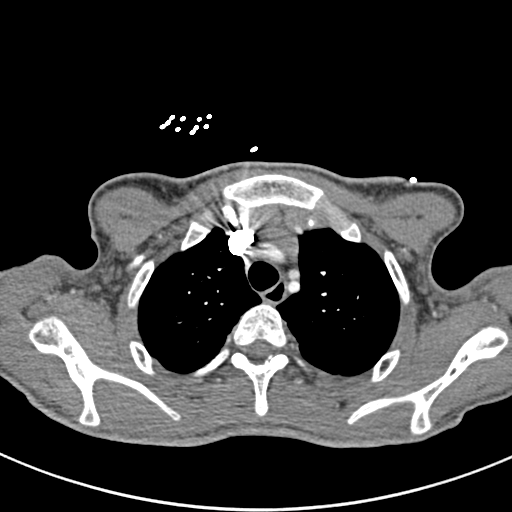
[im 196/238  lung]
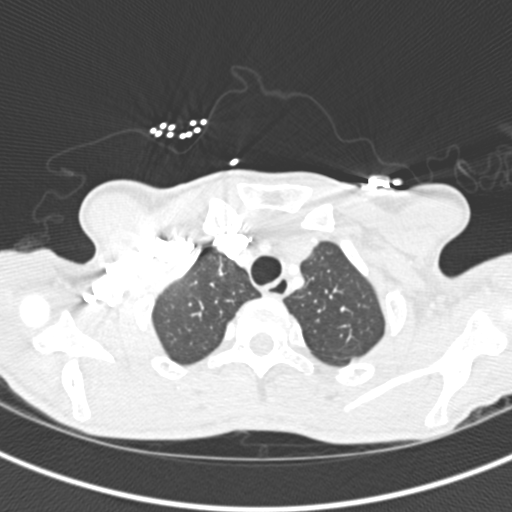
[im 207/238  soft-tissue]
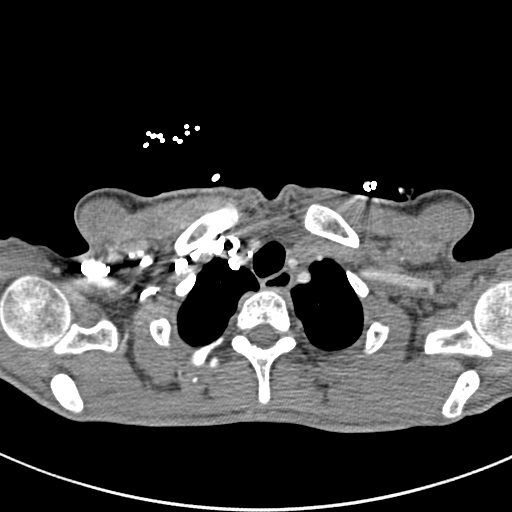
[im 217/238  lung]
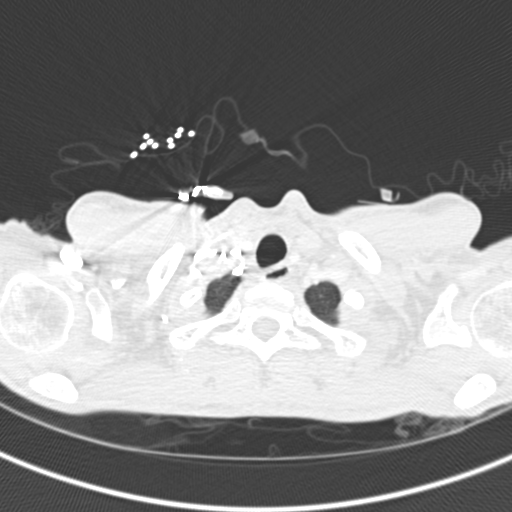
[im 227/238  soft-tissue]
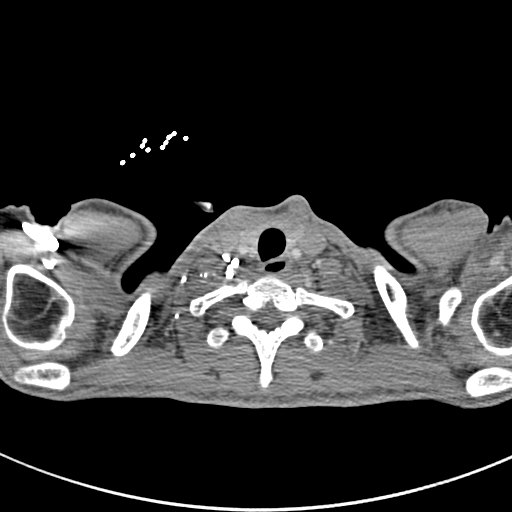

[20 of 32 positions shown; findings below may reference images not displayed]

FINDINGS: There are no pulmonary emboli, infiltrates, effusions,
mass lesions, or adenopathy.  Osseous structures are normal. Heart
size is normal.  Vascularity is normal.
IMPRESSION: Normal exam.

## 2012-02-28 MED ORDER — ALPRAZOLAM 0.25 MG PO TABS: 0.2500 mg | ORAL_TABLET | Freq: Every evening | ORAL | Status: DC | PRN

## 2012-02-28 MED ORDER — PANTOPRAZOLE SODIUM 40 MG PO TBEC
40.0000 mg | DELAYED_RELEASE_TABLET | Freq: Every day | ORAL | Status: DC
  Administered 2012-02-28 – 2012-03-01 (×3): 40 mg via ORAL
  Filled 2012-02-28 (×3): qty 1

## 2012-02-28 MED ORDER — ENALAPRIL MALEATE 10 MG PO TABS
10.0000 mg | ORAL_TABLET | Freq: Every day | ORAL | Status: DC
  Administered 2012-02-29: 10 mg via ORAL
  Filled 2012-02-28 (×3): qty 1

## 2012-02-28 MED ORDER — SODIUM CHLORIDE 0.9 % IV SOLN
INTRAVENOUS | Status: DC
  Filled 2012-02-28: qty 1

## 2012-02-28 MED ORDER — SODIUM CHLORIDE 0.9 % IJ SOLN
3.0000 mL | Freq: Two times a day (BID) | INTRAMUSCULAR | Status: DC
  Administered 2012-02-28 – 2012-03-01 (×4): 3 mL via INTRAVENOUS

## 2012-02-28 MED ORDER — HYDROMORPHONE HCL PF 1 MG/ML IJ SOLN
1.0000 mg | Freq: Once | INTRAMUSCULAR | Status: AC
  Administered 2012-02-28: 1 mg via INTRAVENOUS
  Filled 2012-02-28: qty 1

## 2012-02-28 MED ORDER — DEXTROSE 50 % IV SOLN: 25.0000 mL | INTRAVENOUS | Status: DC | PRN

## 2012-02-28 MED ORDER — INSULIN REGULAR BOLUS VIA INFUSION
0.0000 [IU] | Freq: Three times a day (TID) | INTRAVENOUS | Status: DC
  Filled 2012-02-28: qty 10

## 2012-02-28 MED ORDER — SODIUM CHLORIDE 0.9 % IV SOLN
INTRAVENOUS | Status: DC
  Administered 2012-02-28: 13:00:00 via INTRAVENOUS

## 2012-02-28 MED ORDER — DICYCLOMINE HCL 10 MG PO CAPS
10.0000 mg | ORAL_CAPSULE | Freq: Three times a day (TID) | ORAL | Status: DC
  Administered 2012-02-28 – 2012-03-01 (×8): 10 mg via ORAL
  Filled 2012-02-28 (×12): qty 1

## 2012-02-28 MED ORDER — HEPARIN SODIUM (PORCINE) 5000 UNIT/ML IJ SOLN
5000.0000 [IU] | Freq: Three times a day (TID) | INTRAMUSCULAR | Status: DC
  Administered 2012-02-28 – 2012-03-01 (×6): 5000 [IU] via SUBCUTANEOUS
  Filled 2012-02-28 (×9): qty 1

## 2012-02-28 MED ORDER — DEXTROSE-NACL 5-0.45 % IV SOLN
INTRAVENOUS | Status: DC
  Administered 2012-02-28: 15:00:00 via INTRAVENOUS
  Administered 2012-02-29: 1000 mL via INTRAVENOUS

## 2012-02-28 MED ORDER — INFLUENZA VIRUS VACC SPLIT PF IM SUSP
0.5000 mL | INTRAMUSCULAR | Status: AC
  Administered 2012-02-29: 0.5 mL via INTRAMUSCULAR
  Filled 2012-02-28: qty 0.5

## 2012-02-28 MED ORDER — ACETAMINOPHEN 325 MG PO TABS: 650.0000 mg | ORAL_TABLET | Freq: Four times a day (QID) | ORAL | Status: DC | PRN

## 2012-02-28 MED ORDER — ENOXAPARIN SODIUM 60 MG/0.6ML ~~LOC~~ SOLN
50.0000 mg | Freq: Two times a day (BID) | SUBCUTANEOUS | Status: DC
  Filled 2012-02-28: qty 0.6

## 2012-02-28 MED ORDER — SODIUM CHLORIDE 0.9 % IV SOLN
INTRAVENOUS | Status: DC
  Administered 2012-02-28: 18:00:00 via INTRAVENOUS

## 2012-02-28 MED ORDER — CYCLOBENZAPRINE HCL 10 MG PO TABS
10.0000 mg | ORAL_TABLET | Freq: Every day | ORAL | Status: DC
  Administered 2012-02-28 – 2012-02-29 (×2): 10 mg via ORAL
  Filled 2012-02-28 (×3): qty 1

## 2012-02-28 MED ORDER — PROMETHAZINE HCL 25 MG PO TABS: 25.0000 mg | ORAL_TABLET | Freq: Four times a day (QID) | ORAL | Status: DC | PRN

## 2012-02-28 MED ORDER — SODIUM CHLORIDE 0.9 % IV SOLN: 1000.0000 mL | Freq: Once | INTRAVENOUS | Status: DC

## 2012-02-28 MED ORDER — SODIUM CHLORIDE 0.9 % IV SOLN
INTRAVENOUS | Status: DC
  Administered 2012-02-28: 3.7 [IU]/h via INTRAVENOUS
  Filled 2012-02-28: qty 1

## 2012-02-28 MED ORDER — ACETAMINOPHEN 650 MG RE SUPP: 650.0000 mg | Freq: Four times a day (QID) | RECTAL | Status: DC | PRN

## 2012-02-28 MED ORDER — IOHEXOL 350 MG/ML SOLN
100.0000 mL | Freq: Once | INTRAVENOUS | Status: AC | PRN
  Administered 2012-02-28: 100 mL via INTRAVENOUS

## 2012-02-28 MED ORDER — METOCLOPRAMIDE HCL 10 MG PO TABS
10.0000 mg | ORAL_TABLET | Freq: Four times a day (QID) | ORAL | Status: DC
  Administered 2012-02-28 – 2012-03-01 (×8): 10 mg via ORAL
  Filled 2012-02-28 (×10): qty 1

## 2012-02-28 MED ORDER — SODIUM CHLORIDE 0.9 % IV SOLN: INTRAVENOUS | Status: DC

## 2012-02-28 MED ORDER — ONDANSETRON HCL 4 MG/2ML IJ SOLN
4.0000 mg | Freq: Four times a day (QID) | INTRAMUSCULAR | Status: DC | PRN
  Administered 2012-02-28: 4 mg via INTRAVENOUS
  Filled 2012-02-28: qty 2

## 2012-02-28 MED ORDER — ONDANSETRON HCL 4 MG PO TABS: 4.0000 mg | ORAL_TABLET | Freq: Four times a day (QID) | ORAL | Status: DC | PRN

## 2012-02-28 MED ORDER — ONDANSETRON HCL 4 MG/2ML IJ SOLN
4.0000 mg | Freq: Once | INTRAMUSCULAR | Status: AC
  Administered 2012-02-28: 4 mg via INTRAVENOUS
  Filled 2012-02-28: qty 2

## 2012-02-28 MED ORDER — HYDROCODONE-ACETAMINOPHEN 5-325 MG PO TABS
1.0000 | ORAL_TABLET | ORAL | Status: DC | PRN
  Administered 2012-02-29: 2 via ORAL
  Filled 2012-02-28: qty 2

## 2012-02-28 MED ORDER — SODIUM CHLORIDE 0.9 % IV BOLUS (SEPSIS): 1000.0000 mL | Freq: Once | INTRAVENOUS | Status: DC

## 2012-02-28 MED ORDER — GABAPENTIN 300 MG PO CAPS
300.0000 mg | ORAL_CAPSULE | Freq: Every day | ORAL | Status: DC
  Administered 2012-02-28 – 2012-02-29 (×2): 300 mg via ORAL
  Filled 2012-02-28 (×3): qty 1

## 2012-02-28 MED ORDER — SODIUM CHLORIDE 0.9 % IV SOLN: 1000.0000 mL | INTRAVENOUS | Status: DC

## 2012-02-28 MED ORDER — DEXTROSE-NACL 5-0.45 % IV SOLN: INTRAVENOUS | Status: DC

## 2012-02-28 MED ORDER — ENOXAPARIN SODIUM 60 MG/0.6ML ~~LOC~~ SOLN
50.0000 mg | SUBCUTANEOUS | Status: DC
  Filled 2012-02-28: qty 0.6

## 2012-02-28 MED ORDER — SODIUM CHLORIDE 0.9 % IV SOLN
1000.0000 mL | Freq: Once | INTRAVENOUS | Status: DC
  Administered 2012-02-28: 1000 mL via INTRAVENOUS

## 2012-02-28 MED ORDER — ALBUTEROL SULFATE (5 MG/ML) 0.5% IN NEBU: 2.5000 mg | INHALATION_SOLUTION | RESPIRATORY_TRACT | Status: DC | PRN

## 2012-02-28 MED ORDER — SODIUM CHLORIDE 0.9 % IV BOLUS (SEPSIS)
1000.0000 mL | Freq: Once | INTRAVENOUS | Status: AC
  Administered 2012-02-28: 1000 mL via INTRAVENOUS

## 2012-02-28 MED ORDER — INSULIN REGULAR HUMAN 100 UNIT/ML IJ SOLN: 10.0000 [IU] | Freq: Once | INTRAMUSCULAR | Status: DC

## 2012-02-28 MED ORDER — ADULT MULTIVITAMIN W/MINERALS CH
1.0000 | ORAL_TABLET | Freq: Every day | ORAL | Status: DC
  Administered 2012-02-28: 1 via ORAL
  Filled 2012-02-28 (×3): qty 1

## 2012-02-28 NOTE — ED Notes (Signed)
Hospitalist at bedside 

## 2012-02-28 NOTE — ED Notes (Signed)
Pt presenting to ed with c/o chest pain and shortness of breath. Pt states positive nausea no vomiting pt states positive lightheadedness and dizziness pt states onset while at work this morning.

## 2012-02-28 NOTE — H&P (Addendum)
[facility] History and Physical  [name]:[id number] DOB: January 20, 1991 DOA: 02/28/2012  Referring physician: Dr. Venora Maples PCP: [name], NP  Specialists: none  Chief Complaint: SOB and CP  HPI: [name] is a 21 y.o. female  With past medical history of diabetes type 1 with last hemoglobin A1c of 6.1, and gastroparesis that comes in for sudden onset of chest pain and shortness of breath on the day of admission. She relates she was sitting down and when she got up she got dizzy. He started getting chest pain and shortness of breath that has progressively been getting worse. She relates nothing makes it better and deep inspiration make it worse. She also had palpitations during this time, no sweating some nausea no vomiting no diarrhea no fever no chills no sick contacts, no cough, no new medications.  Review of Systems: The patient denies anorexia, fever, weight loss,, vision loss, decreased hearing, hoarseness,  syncope, dyspnea on exertion, peripheral edema, balance deficits, hemoptysis, abdominal pain, melena, hematochezia, severe indigestion/heartburn, hematuria, incontinence, genital sores, muscle weakness, suspicious skin lesions, transient blindness, difficulty walking, depression, unusual weight change, abnormal bleeding, enlarged lymph nodes, angioedema, and breast masses.    Past Medical History  Diagnosis Date  . Diabetes mellitus   . Hypertension   . Diabetes type 1, uncontrolled 11/14/2011    Since age 34  . Gastroparesis    Past Surgical History  Procedure Date  . Ankle surgery   . Cholecystectomy 11/15/2011    Procedure: LAPAROSCOPIC CHOLECYSTECTOMY WITH INTRAOPERATIVE CHOLANGIOGRAM;  Surgeon: [name], MD;  Location: [facility];  Service: General;  Laterality: N/A;  . Laparoscopy 11/23/2011    Procedure: LAPAROSCOPY DIAGNOSTIC;  Surgeon: Edward Jolly, MD;  Location: [facility];  Service: General;  Laterality: N/A;  . Esophagogastroduodenoscopy  12/03/2011    Procedure: ESOPHAGOGASTRODUODENOSCOPY (EGD);  Surgeon: Beryle Beams, MD;  Location: [facility];  Service: Endoscopy;  Laterality: N/A;   Social History:  reports that she has never smoked. She has never used smokeless tobacco. She reports that she does not drink alcohol or use illicit drugs. Visit home with her friend can perform all his ADLs  Allergies  Allergen Reactions  . Other Anaphylaxis    [place] nuts     Family History  Problem Relation Age of Onset  . Diabetes Mother   . Hypertension Father    Prior to Admission medications   Medication Sig Start Date End Date Taking? Authorizing Provider  acyclovir (ZOVIRAX) 400 MG tablet Take 400 mg by mouth 2 (two) times daily.   Yes Historical Provider, MD  ALPRAZolam (XANAX) 0.25 MG tablet Take 1 tablet (0.25 mg total) by mouth at bedtime as needed for sleep. 02/18/12  Yes Julianne Rice, MD  cyclobenzaprine (FLEXERIL) 10 MG tablet Take 10 mg by mouth at bedtime.   Yes Historical Provider, MD  dicyclomine (BENTYL) 10 MG capsule Take 10 mg by mouth 4 (four) times daily -  before meals and at bedtime.   Yes Historical Provider, MD  enalapril (VASOTEC) 10 MG tablet Take 10 mg by mouth every evening.    Yes Historical Provider, MD  gabapentin (NEURONTIN) 300 MG capsule Take 300 mg by mouth at bedtime.   Yes Historical Provider, MD  insulin aspart (NOVOLOG) 100 UNIT/ML injection Inject 0-15 Units into the skin 3 (three) times daily before meals. 5 u with each meal + sliding scale: CBG 70 - 120: 0 u: CBG 121 - 150: 2 u; CBG 151 - 200:  3 u; CBG 201 - 250: 5 u; CBG 251 - 300: 8 u;CBG 301 - 350: 11 u; CBG 351 - 400: 15 u; CBG > 400 Call MD 09/20/11 09/19/12 Yes [name], MD  insulin glargine (LANTUS) 100 UNIT/ML injection Inject 30 Units into the skin at bedtime.  09/20/11  Yes [name], MD  metoCLOPramide (REGLAN) 10 MG tablet Take 10 mg by mouth 4 (four) times daily.   Yes Historical Provider, MD  Multiple Vitamin  (MULTIVITAMIN WITH MINERALS) TABS Take 1 tablet by mouth daily.   Yes Historical Provider, MD  omeprazole (PRILOSEC) 40 MG capsule Take 40 mg by mouth daily.   Yes Historical Provider, MD  promethazine (PHENERGAN) 25 MG tablet Take 25 mg by mouth every 6 (six) hours as needed. For nausea   Yes Historical Provider, MD   Physical Exam: Filed Vitals:   02/28/12 0944  BP: 127/80  Pulse: 140  Temp: 97.5 F (36.4 C)  TempSrc: Oral  Resp: 20  SpO2: 100%     General:  Awake alert and oriented x3 ,appears nervous  Eyes: Anicteric pupils equally round and reactive to light  ENT: Dry mucous membranes  Neck: No JVD  Cardiovascular: Regular rate and rhythm with positive S1 and S2 no murmurs rubs gallops  Respiratory: Good air movement and clear to auscultation no tenderness to palpation of the chest  Abdomen: Positive bowel sounds nontender nondistended and soft  Skin: No rashes ulcerations  Musculoskeletal: Intact  Psychiatric: Patient  Neurologic: No focal  Labs on Admission:  Basic Metabolic Panel:  Lab 80/16/55 1004 02/22/12 2005  NA 125* 128*  K 4.7 3.9  CL 84* 87*  CO2 19 23  GLUCOSE 727* 470*  BUN 17 13  CREATININE 0.51 0.42*  CALCIUM 10.4 10.3  MG -- --  PHOS -- --   Liver Function Tests:  Lab 02/22/12 2005  AST 22  ALT 37*  ALKPHOS 126*  BILITOT 0.7  PROT 8.6*  ALBUMIN 4.6   No results found for this basename: LIPASE:5,AMYLASE:5 in the last 168 hours No results found for this basename: AMMONIA:5 in the last 168 hours CBC:  Lab 02/28/12 1004 02/22/12 2005  WBC 4.3 5.6  NEUTROABS -- 3.1  HGB 14.6 15.0  HCT 41.6 40.6  MCV 84.6 82.0  PLT 302 293   Cardiac Enzymes:  Lab 02/28/12 1004  CKTOTAL --  CKMB --  CKMBINDEX --  TROPONINI <0.30    BNP (last 3 results)  Basename 11/14/11 1605  PROBNP 20.0   CBG:  Lab 02/28/12 1207 02/23/12 0059 02/22/12 2242 02/22/12 2128 02/22/12 2011  GLUCAP 430* 124* 263* 373* 509*    Radiological Exams  on Admission: Dg Chest Portable 1 View  02/28/2012  *RADIOLOGY REPORT*  Clinical Data: Chest pain.  Shortness of breath.  PORTABLE CHEST - 1 VIEW  Comparison: Two-view chest 02/18/2012.  Findings: The heart size is normal.  The lungs are clear.  The visualized soft tissues and bony thorax are unremarkable.  IMPRESSION: Negative chest.   Original Report Authenticated By: San Morelle, M.D.     EKG: Sinus tachycardia  Assessment/Plan Principal Problem: Hyperglycemia without ketosis/ Diabetes type 1, uncontrolled: - She relates that she has been taking her insulin regularly. Her last hemoglobin A1c was 6.1. She does have a blood glucose of 700. She does have an internal 20, her bicarbonate was only 19. I agree with starting NS, she has gotten 3 L of normal saline here in the ED  she is only going to the bathroom once, her heart rate has remained in the 130s.. I will do strict I.'s and O.'s give her another liter of normal saline. Continue the insulin stabilizer. Check CBGs q. hours and basic metabolic panel Q4. No clear answer as to why her blood glucose will be greater than 700. -She has no leukocytosis, no fever, which makes infection unlikely. I think she is too young for any acute coronary syndrome. Her EKG does not show any significant ST segment changes. And her pain is not worse with exertion or better with rest. She relates no new medication has no abdominal pain which makes pancreatitis unlikely. Denies any alcohol. We'll check alcohol level and a UDS.  Chest pain/ Dyspnea/Sinus tachycardia: - EKG does show sinus tachycardia with nonspecific T wave changes. Her d-dimer was done which was negative. She continues to be tachycardic after 3 L of normal saline.  - Her EKG does not show any signs of pericarditis. Her history is not compatible with signs of pericarditis. Chest x-ray does not show any broken ribs or pneumonia, she doesn't have leukocytosis or fever. She doesn't have her  gallbladder, still check LFT's. Her pain is worse with deep inspiration in the upper right chest, and she troponin I with one finger. Although her d-dimer is negative her Well's score is moderate. I will go ahead and get CT imaging of the chest to rule out a PE. -Another etiology for her chest pain and dyspnea would be a panic attack. She does not have a history of this. But this would not explain her sustained sinus tachycardia. Sinus tachycardia continues to be returning 130 and 140.  She does not seem to be nervous at this time to that extent. Her breathing is about 12-14 times per minute. She relates no anxiousness. His CT angiogram negative will benefit from benzodiazepines and cognitive therapy. -She has not been vomiting only mildly nauseated she has not taking any alcohol. Which makes pancreatitis unlikely. She doesn't have any abdominal pain. -She has no abdominal pain on physical exam which was gastritis or peptic ulcer disease unlikely.  Hyponatremia: -And is mostly secondary to decreased intravascular volume. We'll go ahead and continue normal saline. Check urinary sodium and urinary creatinine. - A basic metabolic panel in the morning.  Gastroparesis: - She continues to be nauseated, she doesn't want anything to eat. We'll continue her Reglan and is Phenergan. We'll add Zofran when necessary. We'll check an EKG tomorrow morning. We'll start her on a clear liquid diet, modified.  Code Status: full Family Communication: none Disposition Plan: home 3-4 days (indicate anticipated LOS)  Time spent: 16 minutes  Charlynne Cousins [facility] Pager [phone]  If 7PM-7AM, please contact night-coverage [email or web address] Password [facility] 02/28/2012, 12:49 PM

## 2012-02-28 NOTE — ED Provider Notes (Signed)
History     CSN: [id number]  Arrival date & time 02/28/12  9201   First MD Initiated Contact with Patient 02/28/12 [phone]      Chief Complaint  Patient presents with  . Chest Pain  . Shortness of Breath     HPI Patient reports developing chest pains or shortness of breath while at work.  Her nausea preceded all of this.  She denies vomiting.  She reports feeling lightheaded and dizzy.  She has a long-standing history of gastroparesis.  She denies hematemesis melena or hematochezia.  She is a poorly controlled type I diabetic per medical records.  She states compliance with her medications.  She's had no productive cough.  History of DVT or pulmonary embolism.  No unilateral leg swelling.  She denies a history of coronary artery disease.  Her symptoms are moderate in severity.  Nothing worsens or improves her symptoms.   Past Medical History  Diagnosis Date  . Diabetes mellitus   . Hypertension   . Diabetes type 1, uncontrolled 11/14/2011    Since age 78  . Gastroparesis     Past Surgical History  Procedure Date  . Ankle surgery   . Cholecystectomy 11/15/2011    Procedure: LAPAROSCOPIC CHOLECYSTECTOMY WITH INTRAOPERATIVE CHOLANGIOGRAM;  Surgeon: [name], MD;  Location: [facility];  Service: General;  Laterality: N/A;  . Laparoscopy 11/23/2011    Procedure: LAPAROSCOPY DIAGNOSTIC;  Surgeon: Edward Jolly, MD;  Location: [facility];  Service: General;  Laterality: N/A;  . Esophagogastroduodenoscopy 12/03/2011    Procedure: ESOPHAGOGASTRODUODENOSCOPY (EGD);  Surgeon: Beryle Beams, MD;  Location: [facility];  Service: Endoscopy;  Laterality: N/A;    Family History  Problem Relation Age of Onset  . Diabetes Mother   . Hypertension Father     History  Substance Use Topics  . Smoking status: Never Smoker   . Smokeless tobacco: Never Used  . Alcohol Use: No     Comment: Occasional    OB History    Grav Para Term Preterm Abortions TAB SAB Ect Mult Living                    Review of Systems  All other systems reviewed and are negative.    Allergies  Other  Home Medications   Current Outpatient Rx  Name  Route  Sig  Dispense  Refill  . ACYCLOVIR 400 MG PO TABS   Oral   Take 400 mg by mouth 2 (two) times daily.         Marland Kitchen ALPRAZOLAM 0.25 MG PO TABS   Oral   Take 1 tablet (0.25 mg total) by mouth at bedtime as needed for sleep.   30 tablet   0   . CYCLOBENZAPRINE HCL 10 MG PO TABS   Oral   Take 10 mg by mouth at bedtime.         Marland Kitchen DICYCLOMINE HCL 10 MG PO CAPS   Oral   Take 10 mg by mouth 4 (four) times daily -  before meals and at bedtime.         . ENALAPRIL MALEATE 10 MG PO TABS   Oral   Take 10 mg by mouth every evening.          Marland Kitchen GABAPENTIN 300 MG PO CAPS   Oral   Take 300 mg by mouth at bedtime.         . INSULIN ASPART 100 UNIT/ML [place] SOLN   Subcutaneous  Inject 0-15 Units into the skin 3 (three) times daily before meals. 5 u with each meal + sliding scale: CBG 70 - 120: 0 u: CBG 121 - 150: 2 u; CBG 151 - 200: 3 u; CBG 201 - 250: 5 u; CBG 251 - 300: 8 u;CBG 301 - 350: 11 u; CBG 351 - 400: 15 u; CBG > 400 Call MD         . INSULIN GLARGINE 100 UNIT/ML [place] SOLN   Subcutaneous   Inject 30 Units into the skin at bedtime.          Marland Kitchen METOCLOPRAMIDE HCL 10 MG PO TABS   Oral   Take 10 mg by mouth 4 (four) times daily.         . ADULT MULTIVITAMIN W/MINERALS CH   Oral   Take 1 tablet by mouth daily.         Marland Kitchen OMEPRAZOLE 40 MG PO CPDR   Oral   Take 40 mg by mouth daily.         Marland Kitchen PROMETHAZINE HCL 25 MG PO TABS   Oral   Take 25 mg by mouth every 6 (six) hours as needed. For nausea           BP 127/80  Pulse 140  Temp 97.5 F (36.4 C) (Oral)  Resp 20  SpO2 100%  LMP 09/29/2011  Physical Exam  Nursing note and vitals reviewed. Constitutional: She is oriented to person, place, and time. She appears well-developed and well-nourished. No distress.       Cachectic in appearance  HENT:  Head:  Normocephalic and atraumatic.       Dry mucous membranes  Eyes: EOM are normal.  Neck: Normal range of motion.  Cardiovascular: Regular rhythm and normal heart sounds.        Tachycardia  Pulmonary/Chest: Effort normal and breath sounds normal.  Abdominal: Soft. She exhibits no distension. There is no tenderness.  Musculoskeletal: Normal range of motion.  Neurological: She is alert and oriented to person, place, and time.  Skin: Skin is warm and dry.  Psychiatric: She has a normal mood and affect. Judgment normal.    ED Course  Procedures (including critical care time)   Date: 02/28/2012  Rate: 143  Rhythm: Sinus tachycardia  QRS Axis: normal  Intervals: normal  ST/T Wave abnormalities: normal  Conduction Disutrbances: none  Narrative Interpretation:   Old EKG Reviewed: changed from prior ecg      Labs Reviewed  BASIC METABOLIC PANEL - Abnormal; Notable for the following:    Sodium 125 (*)     Chloride 84 (*)     Glucose, Bld 727 (*)     All other components within normal limits  CBC  TROPONIN I  D-DIMER, QUANTITATIVE  POCT PREGNANCY, URINE   Dg Chest Portable 1 View  02/28/2012  *RADIOLOGY REPORT*  Clinical Data: Chest pain.  Shortness of breath.  PORTABLE CHEST - 1 VIEW  Comparison: Two-view chest 02/18/2012.  Findings: The heart size is normal.  The lungs are clear.  The visualized soft tissues and bony thorax are unremarkable.  IMPRESSION: Negative chest.   Original Report Authenticated By: San Morelle, M.D.    I personally reviewed the imaging tests through PACS system I reviewed available ER/hospitalization records through the EMR   1. Hyperglycemia   2. Dehydration   3. Chest pain       MDM  Patient presents with symptoms of chest pain shortness breath and nausea.  I suspect a lot of this is secondary to her gastroparesis.  She is a poorly controlled diabetic her blood sugars here greater than 700 she was started on an insulin drip and given IV  fluid boluses.  Her anion gap was 22 but I think the majority of this is secondary to her hypochloremia.  Her bicarbonate is 19.  The patient will require admission.  Her EKG is sinus tachycardia without signs of arrhythmia.  Her heart rate has been improved with IV fluids.  Her heart rate after 2 L is down to 130.  Her d-dimer is negative.  Chest x-ray shows no evidence of pneumonia        Hoy Morn, MD 02/28/12 1212

## 2012-02-28 NOTE — ED Notes (Signed)
Judeth Cornfield, Consulting civil engineer reported that the room for pt is not ready and will call when ready

## 2012-02-28 NOTE — ED Notes (Signed)
Pt notified why there is a delay in getting to her room.

## 2012-02-28 NOTE — ED Notes (Signed)
Pt vomiting at this time 

## 2012-02-28 NOTE — ED Notes (Signed)
Called report to [facility]. Due to staffing issues, they are not able to accept pt at this time. Will allow time for floor to get room ready before pt is transported. Designer, jewellery notified

## 2012-02-28 NOTE — ED Notes (Signed)
CRITICAL VALUE ALERT  Critical value received:  727 CBG  Date of notification: 11.30.13  Time of notification:  1050  Critical value read back:yes  Nurse who received alert:  S Coe via P. Dowd  MD notified (1st page):  Patria Mane  Time of first page:  1050  MD notified (2nd page):NA  Time of second page:NA  Responding MD:  NA  Time MD responded:  NA

## 2012-02-28 NOTE — ED Notes (Signed)
Pt transported to floor 

## 2012-02-29 DIAGNOSIS — E860 Dehydration: Secondary | ICD-10-CM

## 2012-02-29 DIAGNOSIS — K3184 Gastroparesis: Secondary | ICD-10-CM

## 2012-02-29 DIAGNOSIS — E871 Hypo-osmolality and hyponatremia: Secondary | ICD-10-CM

## 2012-02-29 DIAGNOSIS — E873 Alkalosis: Secondary | ICD-10-CM

## 2012-02-29 DIAGNOSIS — E1101 Type 2 diabetes mellitus with hyperosmolarity with coma: Secondary | ICD-10-CM

## 2012-02-29 LAB — GLUCOSE, CAPILLARY
Glucose-Capillary: 101 mg/dL — ABNORMAL HIGH (ref 70–99)
Glucose-Capillary: 86 mg/dL (ref 70–99)
Glucose-Capillary: 128 mg/dL — ABNORMAL HIGH (ref 70–99)
Glucose-Capillary: 145 mg/dL — ABNORMAL HIGH (ref 70–99)
Glucose-Capillary: 267 mg/dL — ABNORMAL HIGH (ref 70–99)
Glucose-Capillary: 318 mg/dL — ABNORMAL HIGH (ref 70–99)
Glucose-Capillary: 249 mg/dL — ABNORMAL HIGH (ref 70–99)
Glucose-Capillary: 286 mg/dL — ABNORMAL HIGH (ref 70–99)

## 2012-02-29 LAB — CBC
HCT: 31.3 % — ABNORMAL LOW (ref 36.0–46.0)
Hemoglobin: 11.1 g/dL — ABNORMAL LOW (ref 12.0–15.0)
MCH: 29.8 pg (ref 26.0–34.0)
MCHC: 35.5 g/dL (ref 30.0–36.0)
MCV: 83.9 fL (ref 78.0–100.0)
Platelets: 254 10*3/uL (ref 150–400)
RBC: 3.73 MIL/uL — ABNORMAL LOW (ref 3.87–5.11)
RDW: 12.3 % (ref 11.5–15.5)
WBC: 6.4 10*3/uL (ref 4.0–10.5)

## 2012-02-29 LAB — COMPREHENSIVE METABOLIC PANEL
ALT: 25 U/L (ref 0–35)
AST: 20 U/L (ref 0–37)
Albumin: 3 g/dL — ABNORMAL LOW (ref 3.5–5.2)
Alkaline Phosphatase: 70 U/L (ref 39–117)
BUN: 10 mg/dL (ref 6–23)
CO2: 24 mEq/L (ref 19–32)
Calcium: 8.6 mg/dL (ref 8.4–10.5)
Chloride: 99 mEq/L (ref 96–112)
Creatinine, Ser: 0.34 mg/dL — ABNORMAL LOW (ref 0.50–1.10)
GFR calc Af Amer: >90 mL/min (ref >90)
GFR calc non Af Amer: >90 mL/min (ref >90)
Glucose, Bld: 160 mg/dL — ABNORMAL HIGH (ref 70–99)
Potassium: 3.4 mEq/L — ABNORMAL LOW (ref 3.5–5.1)
Sodium: 132 mEq/L — ABNORMAL LOW (ref 135–145)
Total Bilirubin: 0.4 mg/dL (ref 0.3–1.2)
Total Protein: 5.8 g/dL — ABNORMAL LOW (ref 6.0–8.3)

## 2012-02-29 LAB — SODIUM, URINE, RANDOM: Sodium, Ur: 167 mEq/L

## 2012-02-29 LAB — PROTIME-INR
INR: 1.14 (ref 0.00–1.49)
Prothrombin Time: 14.4 seconds (ref 11.6–15.2)

## 2012-02-29 LAB — CREATININE, URINE, RANDOM: Creatinine, Urine: 53.6 mg/dL

## 2012-02-29 MED ORDER — INSULIN GLARGINE 100 UNIT/ML ~~LOC~~ SOLN: 20.0000 [IU] | Freq: Every day | SUBCUTANEOUS | Status: DC

## 2012-02-29 MED ORDER — INSULIN GLARGINE 100 UNIT/ML ~~LOC~~ SOLN
40.0000 [IU] | Freq: Every day | SUBCUTANEOUS | Status: DC
  Administered 2012-02-29: 40 [IU] via SUBCUTANEOUS

## 2012-02-29 MED ORDER — POTASSIUM CHLORIDE CRYS ER 20 MEQ PO TBCR
40.0000 meq | EXTENDED_RELEASE_TABLET | Freq: Two times a day (BID) | ORAL | Status: AC
  Administered 2012-02-29 (×2): 40 meq via ORAL
  Filled 2012-02-29 (×2): qty 2

## 2012-02-29 MED ORDER — INSULIN GLARGINE 100 UNIT/ML ~~LOC~~ SOLN: 30.0000 [IU] | Freq: Every day | SUBCUTANEOUS | Status: DC

## 2012-02-29 MED ORDER — INSULIN GLARGINE 100 UNIT/ML ~~LOC~~ SOLN
10.0000 [IU] | Freq: Every day | SUBCUTANEOUS | Status: DC
  Administered 2012-02-29: 10 [IU] via SUBCUTANEOUS

## 2012-02-29 MED ORDER — INSULIN ASPART 100 UNIT/ML ~~LOC~~ SOLN
0.0000 [IU] | Freq: Three times a day (TID) | SUBCUTANEOUS | Status: DC
  Administered 2012-02-29: 3 [IU] via SUBCUTANEOUS
  Administered 2012-02-29: 7 [IU] via SUBCUTANEOUS
  Administered 2012-02-29: 5 [IU] via SUBCUTANEOUS
  Administered 2012-03-01: 2 [IU] via SUBCUTANEOUS
  Administered 2012-03-01: 1 [IU] via SUBCUTANEOUS

## 2012-02-29 MED ORDER — INSULIN ASPART 100 UNIT/ML ~~LOC~~ SOLN
0.0000 [IU] | Freq: Every day | SUBCUTANEOUS | Status: DC
  Administered 2012-02-29: 3 [IU] via SUBCUTANEOUS

## 2012-02-29 MED ORDER — SODIUM CHLORIDE 0.9 % IV SOLN
INTRAVENOUS | Status: DC
  Administered 2012-02-29: 75 mL/h via INTRAVENOUS
  Administered 2012-03-01: 12:00:00 via INTRAVENOUS

## 2012-02-29 MED ORDER — OXYCODONE-ACETAMINOPHEN 5-325 MG PO TABS
1.0000 | ORAL_TABLET | ORAL | Status: DC | PRN
  Administered 2012-02-29 – 2012-03-01 (×3): 1 via ORAL
  Filled 2012-02-29 (×3): qty 1

## 2012-02-29 MED ORDER — INSULIN GLARGINE 100 UNIT/ML ~~LOC~~ SOLN: 40.0000 [IU] | Freq: Every day | SUBCUTANEOUS | Status: DC

## 2012-02-29 NOTE — Progress Notes (Signed)
[facility] PROGRESS NOTE  Assessment/Plan: Hyperglycemia without ketosis (02/28/2012)/Diabetes type 1, uncontrolled (11/14/2011) - Insulin drip on admission, transition of lantus 11.30.2013 plus SSI. Good controlled. - Started on NS infusion + 5L. Good UOP. - HBGA1c 10.8, aggressive BG controlled. - no signs of infectious, ischemic etiology. ? Non compliance. UDS negative, ETOH <11  Chest pain (12/04/2011) - resolved. CT angio negative. - Tachycardia resolved. - ? GERD vs panic attacks.  Hyponatremia (09/19/2011) - Resolved with IV fluids started on admission.  Gastroparesis (11/14/2011) - Reglan and proto nix.  Sinus tachycardia (02/28/2012) - improving with NS. - cont normal saline.   Code Status: none Family Communication: partner  Disposition Plan: home in am   Consultants:  none  Procedures:  none  Antibiotics: None  HPI/Subjective: No complains  Objective: Filed Vitals:   02/29/12 0400 02/29/12 0500 02/29/12 0600 02/29/12 0700  BP: 115/72 125/82 133/90 128/82  Pulse: 103 109 108 107  Temp: 99.9 F (37.7 C)     TempSrc: Oral     Resp: 13 18 14 14   Height:      Weight:      SpO2: 99% 99% 100% 100%    Intake/Output Summary (Last 24 hours) at 02/29/12 0733 Last data filed at 02/29/12 0600  Gross per 24 hour  Intake 1657.5 ml  Output    850 ml  Net  807.5 ml   Filed Weights   02/28/12 1620  Weight: 49.8 kg (109 lb 12.6 oz)    Exam:  General: Alert, awake, oriented x3, in no acute distress.  HEENT: No bruits, no goiter.  Heart: Regular rate and rhythm, without murmurs, rubs, gallops.  Lungs: Good air movement, clear to auscultation Abdomen: Soft, nontender, nondistended, positive bowel sounds.  Neuro: Grossly intact, nonfocal.   Data Reviewed: Basic Metabolic Panel:  Lab 70/01/74 0347 02/28/12 1004 02/22/12 2005  NA 132* 125* 128*  K 3.4* 4.7 3.9  CL 99 84* 87*  CO2 24 19 23   GLUCOSE 160* 727* 470*  BUN 10 17 13   CREATININE  0.34* 0.51 0.42*  CALCIUM 8.6 10.4 10.3  MG -- -- --  PHOS -- -- --   Liver Function Tests:  Lab 02/29/12 0347 02/28/12 1740 02/22/12 2005  AST 20 15 22   ALT 25 27 37*  ALKPHOS 70 81 126*  BILITOT 0.4 0.4 0.7  PROT 5.8* 6.7 8.6*  ALBUMIN 3.0* 3.2* 4.6   No results found for this basename: LIPASE:5,AMYLASE:5 in the last 168 hours No results found for this basename: AMMONIA:5 in the last 168 hours CBC:  Lab 02/29/12 0347 02/28/12 1004 02/22/12 2005  WBC 6.4 4.3 5.6  NEUTROABS -- -- 3.1  HGB 11.1* 14.6 15.0  HCT 31.3* 41.6 40.6  MCV 83.9 84.6 82.0  PLT 254 302 293   Cardiac Enzymes:  Lab 02/28/12 1004  CKTOTAL --  CKMB --  CKMBINDEX --  TROPONINI <0.30   BNP (last 3 results)  Basename 11/14/11 1605  PROBNP 20.0   CBG:  Lab 02/29/12 0335 02/29/12 0232 02/29/12 0124 02/29/12 0021 02/28/12 2315  GLUCAP 145* 128* 101* 86 78    Recent Results (from the past 240 hour(s))  MRSA PCR SCREENING     Status: Abnormal   Collection Time   02/28/12  4:36 PM      Component Value Range Status Comment   MRSA by PCR INVALID RESULTS, SPECIMEN SENT FOR CULTURE (*) NEGATIVE Final      Studies: Ct Angio Chest Pe W/cm &/or Wo  Cm  02/28/2012  *RADIOLOGY REPORT*  Clinical Data: Tachycardia.  Chest pain.  Shortness of breath.  CT ANGIOGRAPHY CHEST  Technique:  Multidetector CT imaging of the chest using the standard protocol during bolus administration of intravenous contrast. Multiplanar reconstructed images including MIPs were obtained and reviewed to evaluate the vascular anatomy.  Contrast: 183m OMNIPAQUE IOHEXOL 350 MG/ML SOLN  Comparison: Chest x-ray dated 02/28/2012 and CT angiograms dated 12/04/2011 and 11/07/2011  Findings: There are no pulmonary emboli, infiltrates, effusions, mass lesions, or adenopathy.  Osseous structures are normal. Heart size is normal.  Vascularity is normal.  IMPRESSION: Normal exam.   Original Report Authenticated By: JLorriane Shire M.D.    Dg Chest  Portable 1 View  02/28/2012  *RADIOLOGY REPORT*  Clinical Data: Chest pain.  Shortness of breath.  PORTABLE CHEST - 1 VIEW  Comparison: Two-view chest 02/18/2012.  Findings: The heart size is normal.  The lungs are clear.  The visualized soft tissues and bony thorax are unremarkable.  IMPRESSION: Negative chest.   Original Report Authenticated By: CSan Morelle M.D.     Scheduled Meds:   . cyclobenzaprine  10 mg Oral QHS  . dicyclomine  10 mg Oral TID AC & HS  . enalapril  10 mg Oral q1800  . gabapentin  300 mg Oral QHS  . heparin  5,000 Units Subcutaneous Q8H  . [COMPLETED]  HYDROmorphone (DILAUDID) injection  1 mg Intravenous Once  . [COMPLETED]  HYDROmorphone (DILAUDID) injection  1 mg Intravenous Once  . influenza  inactive virus vaccine  0.5 mL Intramuscular Tomorrow-1000  . insulin aspart  0-5 Units Subcutaneous QHS  . insulin aspart  0-9 Units Subcutaneous TID WC  . insulin glargine  20 Units Subcutaneous QHS  . metoCLOPramide  10 mg Oral QID  . multivitamin with minerals  1 tablet Oral Daily  . [COMPLETED] ondansetron (ZOFRAN) IV  4 mg Intravenous Once  . pantoprazole  40 mg Oral Daily  . potassium chloride  40 mEq Oral BID  . [COMPLETED] sodium chloride  1,000 mL Intravenous Once  . sodium chloride  3 mL Intravenous Q12H  . [DISCONTINUED] sodium chloride  1,000 mL Intravenous Once  . [COMPLETED] sodium chloride  1,000 mL Intravenous Once  . [DISCONTINUED] enoxaparin (LOVENOX) injection  50 mg Subcutaneous NOW  . [DISCONTINUED] enoxaparin (LOVENOX) injection  50 mg Subcutaneous Q12H  . [DISCONTINUED] insulin glargine  10 Units Subcutaneous QHS  . [DISCONTINUED] insulin glargine  30 Units Subcutaneous QHS  . [DISCONTINUED] insulin regular  10 Units Intravenous Once  . [DISCONTINUED] insulin regular  0-10 Units Intravenous TID WC  . [DISCONTINUED] sodium chloride  1,000 mL Intravenous Once   Continuous Infusions:   . sodium chloride    . [DISCONTINUED] sodium  chloride    . [DISCONTINUED] sodium chloride Stopped (02/28/12 1446)  . [DISCONTINUED] sodium chloride 125 mL/hr at 02/28/12 1801  . [DISCONTINUED] dextrose 5 % and 0.45% NaCl    . [DISCONTINUED] dextrose 5 % and 0.45% NaCl 1,000 mL (02/29/12 0208)  . [DISCONTINUED] insulin (NOVOLIN-R) infusion 5.2 Units/hr (02/28/12 1536)  . [DISCONTINUED] insulin (NOVOLIN-R) infusion    . [DISCONTINUED] insulin (NOVOLIN-R) infusion       FCharlynne Cousins [facility] Pager [phone] If 8PM-8AM, please contact night-coverage at [email or web address], password [email or web address]12/04/2011, 7:33 AM  LOS: 1 day

## 2012-02-29 NOTE — Discharge Summary (Signed)
Physician Discharge Summary  [name] TSV:[id number] DOB: 1990-05-04 DOA: 02/28/2012  PCP: [name], NP  Admit date: 02/28/2012 Discharge date: 03/01/2012  Time spent: 35  minutes  Recommendations for Outpatient Follow-up:  1. Follow up with PCP in 2 weeks: Check BG pattern and titrate Insulin as an outpatient. (include homehealth, outpatient follow-up instructions, specific recommendations for PCP to follow-up on, etc.)  Discharge Diagnoses:  Principal Problem:  *Hyperglycemia without ketosis Active Problems:  Chest pain  Hyponatremia  Diabetes type 1, uncontrolled  Gastroparesis  Dyspnea  Sinus tachycardia   Discharge Condition: stable  Diet recommendation: carb modified  Filed Weights   02/28/12 1620 02/29/12 1025  Weight: 49.8 kg (109 lb 12.6 oz) 50.6 kg (111 lb 8.8 oz)    History of present illness:  21 y.o. female  With past medical history of diabetes type 1 with last hemoglobin A1c of 6.1, and gastroparesis that comes in for sudden onset of chest pain and shortness of breath on the day of admission. She relates she was sitting down and when she got up she got dizzy. He started getting chest pain and shortness of breath that has progressively been getting worse. She relates nothing makes it better and deep inspiration make it worse. She also had palpitations during this time, no sweating some nausea no vomiting no diarrhea no fever no chills no sick contacts, no cough, no new medications.   Hospital Course:  Hyperglycemia without ketosis (02/28/2012)/Diabetes type 1, uncontrolled (11/14/2011) - Insulin drip on admission, transition of lantus 11.30.2013 plus SSI.  - Started on NS infusion + 5L on admission with Good UOP.  - HBGA1c 10.8, aggressive BG controlled.  - no signs of infectious, ischemic etiology. ? Non compliance. UDS negative, ETOH <11. - Insulin increase to 40 unit will follow up with PCP as an outpatient. Had good controlled during hospital  stay.  Chest pain (12/04/2011) - resolved. CT angio negative.  - Tachycardia resolved.  - ? panic attacks.   Hyponatremia (09/19/2011) - Resolved with IV fluids started on admission.   Gastroparesis (11/14/2011) - Reglan and proto nix.   Sinus tachycardia (02/28/2012) - improving with NS, but continue to be mildly tachy. Cardiac enzymes neg, EKG some none specific T wave changes. ETOH <11, UDS negative. TSH 0.4. No fevers. No sudden increase in BP or or heart rate, no PE,  no hypoxia. No stimulants. Hbg 11.1. ? ideopathic - started on low dose beta blockers   Procedures: CT angio negative for PE.   Consultations:  none  Discharge Exam: Filed Vitals:   02/29/12 1300 02/29/12 2145 03/01/12 0530 03/01/12 0900  BP: 121/77 134/81 128/91 116/75  Pulse: 105 113 112 111  Temp: 98 F (36.7 C) 98.4 F (36.9 C) 98.4 F (36.9 C) 97.5 F (36.4 C)  TempSrc: Oral Oral Oral Oral  Resp: 17 16 16    Height:      Weight:      SpO2: 100% 96% 100% 100%    General: A&Ox3 Cardiovascular: RRR Respiratory: good air movement CTA B/L  Discharge Instructions      Discharge Orders    Future Orders Please Complete By Expires   Diet - low sodium heart healthy      Increase activity slowly          Medication List     As of 03/01/2012 11:27 AM    TAKE these medications         acyclovir 400 MG tablet   Commonly known as: ZOVIRAX  Take 400 mg by mouth 2 (two) times daily.      ALPRAZolam 0.25 MG tablet   Commonly known as: XANAX   Take 1 tablet (0.25 mg total) by mouth at bedtime as needed for sleep.      cyclobenzaprine 10 MG tablet   Commonly known as: FLEXERIL   Take 10 mg by mouth at bedtime.      dicyclomine 10 MG capsule   Commonly known as: BENTYL   Take 10 mg by mouth 4 (four) times daily -  before meals and at bedtime.      enalapril 10 MG tablet   Commonly known as: VASOTEC   Take 10 mg by mouth every evening.      gabapentin 300 MG capsule   Commonly known  as: NEURONTIN   Take 300 mg by mouth at bedtime.      insulin aspart 100 UNIT/ML injection   Commonly known as: novoLOG   Inject 0-15 Units into the skin 3 (three) times daily before meals. 5 u with each meal + sliding scale: CBG 70 - 120: 0 u: CBG 121 - 150: 2 u; CBG 151 - 200: 3 u; CBG 201 - 250: 5 u; CBG 251 - 300: 8 u;CBG 301 - 350: 11 u; CBG 351 - 400: 15 u; CBG > 400 Call MD      insulin glargine 100 UNIT/ML injection   Commonly known as: LANTUS   Inject 40 Units into the skin at bedtime.      metoCLOPramide 10 MG tablet   Commonly known as: REGLAN   Take 10 mg by mouth 4 (four) times daily.      metoprolol tartrate 12.5 mg Tabs   Commonly known as: LOPRESSOR   Take 0.5 tablets (12.5 mg total) by mouth 2 (two) times daily.      multivitamin with minerals Tabs   Take 1 tablet by mouth daily.      omeprazole 40 MG capsule   Commonly known as: PRILOSEC   Take 40 mg by mouth daily.      promethazine 25 MG tablet   Commonly known as: PHENERGAN   Take 25 mg by mouth every 6 (six) hours as needed. For nausea         Follow-up Information    Follow up with MABE,DAVID, NP. In 2 weeks. (hospital follow up)    Contact information:   [place] [place] [address] [phone]           The results of significant diagnostics from this hospitalization (including imaging, microbiology, ancillary and laboratory) are listed below for reference.    Significant Diagnostic Studies: Dg Chest 2 View  02/18/2012  *RADIOLOGY REPORT*  Clinical Data: Chest pain and cough.  CHEST - 2 VIEW  Comparison: 12/04/2011 and earlier studies  Findings: Vascular clips in the right upper abdomen. Lungs clear. Heart size and pulmonary vascularity normal.  No effusion. Visualized bones unremarkable.  IMPRESSION: No acute disease   Original Report Authenticated By: D. Hassell III, MD    Ct Angio Chest Pe W/cm &/or Wo Cm  02/28/2012  *RADIOLOGY REPORT*  Clinical Data:  Tachycardia.  Chest pain.  Shortness of breath.  CT ANGIOGRAPHY CHEST  Technique:  Multidetector CT imaging of the chest using the standard protocol during bolus administration of intravenous contrast. Multiplanar reconstructed images including MIPs were obtained and reviewed to evaluate the vascular anatomy.  Contrast: 167m OMNIPAQUE IOHEXOL 350 MG/ML SOLN  Comparison: Chest x-ray dated  02/28/2012 and CT angiograms dated 12/04/2011 and 11/07/2011  Findings: There are no pulmonary emboli, infiltrates, effusions, mass lesions, or adenopathy.  Osseous structures are normal. Heart size is normal.  Vascularity is normal.  IMPRESSION: Normal exam.   Original Report Authenticated By: Lorriane Shire, M.D.    Dg Chest Portable 1 View  02/28/2012  *RADIOLOGY REPORT*  Clinical Data: Chest pain.  Shortness of breath.  PORTABLE CHEST - 1 VIEW  Comparison: Two-view chest 02/18/2012.  Findings: The heart size is normal.  The lungs are clear.  The visualized soft tissues and bony thorax are unremarkable.  IMPRESSION: Negative chest.   Original Report Authenticated By: San Morelle, M.D.     Microbiology: Recent Results (from the past 240 hour(s))  MRSA CULTURE     Status: Normal (Preliminary result)   Collection Time   02/28/12  4:30 PM      Component Value Range Status Comment   Specimen Description NOSE   Final    Special Requests NONE   Final    Culture NO SUSPICIOUS COLONIES, CONTINUING TO HOLD   Final    Report Status PENDING   Incomplete   MRSA PCR SCREENING     Status: Abnormal   Collection Time   02/28/12  4:36 PM      Component Value Range Status Comment   MRSA by PCR INVALID RESULTS, SPECIMEN SENT FOR CULTURE (*) NEGATIVE Final      Labs: Basic Metabolic Panel:  Lab 52/48/18 0347 02/28/12 1004  NA 132* 125*  K 3.4* 4.7  CL 99 84*  CO2 24 19  GLUCOSE 160* 727*  BUN 10 17  CREATININE 0.34* 0.51  CALCIUM 8.6 10.4  MG -- --  PHOS -- --   Liver Function Tests:  Lab 02/29/12 0347  02/28/12 1740  AST 20 15  ALT 25 27  ALKPHOS 70 81  BILITOT 0.4 0.4  PROT 5.8* 6.7  ALBUMIN 3.0* 3.2*   No results found for this basename: LIPASE:5,AMYLASE:5 in the last 168 hours No results found for this basename: AMMONIA:5 in the last 168 hours CBC:  Lab 02/29/12 0347 02/28/12 1004  WBC 6.4 4.3  NEUTROABS -- --  HGB 11.1* 14.6  HCT 31.3* 41.6  MCV 83.9 84.6  PLT 254 302   Cardiac Enzymes:  Lab 02/28/12 1004  CKTOTAL --  CKMB --  CKMBINDEX --  TROPONINI <0.30   BNP: BNP (last 3 results)  Basename 11/14/11 1605  PROBNP 20.0   CBG:  Lab 03/01/12 0739 02/29/12 2146 02/29/12 1637 02/29/12 1214 02/29/12 0820  GLUCAP 159* 286* 249* 318* 267*   Signed:  Charlynne Cousins  [facility] 03/01/2012, 11:27 AM

## 2012-03-01 LAB — GLUCOSE, CAPILLARY
Glucose-Capillary: 159 mg/dL — ABNORMAL HIGH (ref 70–99)
Glucose-Capillary: 130 mg/dL — ABNORMAL HIGH (ref 70–99)

## 2012-03-01 MED ORDER — METOPROLOL TARTRATE 12.5 MG HALF TABLET
12.5000 mg | ORAL_TABLET | Freq: Two times a day (BID) | ORAL | Status: DC
  Administered 2012-03-01: 12.5 mg via ORAL
  Filled 2012-03-01 (×2): qty 1

## 2012-03-01 MED ORDER — INSULIN GLARGINE 100 UNIT/ML ~~LOC~~ SOLN: 40.0000 [IU] | Freq: Every day | SUBCUTANEOUS | Status: DC

## 2012-03-01 MED ORDER — METOPROLOL TARTRATE 12.5 MG HALF TABLET: 12.5000 mg | ORAL_TABLET | Freq: Two times a day (BID) | ORAL | Status: DC

## 2012-03-01 NOTE — Progress Notes (Signed)
Nutrition Brief Note  Patient identified for low BMI.   Body mass index is 17.47 kg/(m^2). Pt meets criteria for underweight based on current BMI.   Pt meets criteria for severe malnutrition of chronic illness AEB <75% estimated energy intake for the past few months with 8.3% weight loss in the past 3 months.   Current diet order is CHO modified, patient is consuming approximately 100% of meals at this time. Labs and medications reviewed. Noted CBGs of 700 mg/dL on admission and pt with HbA1c of 10.8. This is pt's 4th admission in the past 6 months and pt with 14 ED visits in the past 6 months. Pt known to RD from previous admission in August of this year where pt was found to have gastroparesis through an EGD. Pt reports nausea/vomiting for 3 weeks after August discharge. Pt reports she had been following a bland diet for gastroparesis and eating 2 meals/day and drinking 1 Boost/day. Pt is a type 1 diabetic and states she had been dosing her insulin appropriately. Pt reports her blood sugars run 200-300mg /dL at home. Pt states she has a doctor that she sees for her blood sugars but she does not meet with that doctor until later on this month. Pt attributes her elevated blood sugars on admission to eating a lot for Thanksgiving and eating a large box of Nerd candies. Noted pt to be d/c today. Pt denied any educational needs during last admission in August and this admission, however pt agreeable to meeting with [facility] - discussed with MD - will order consult.   No further nutrition interventions warranted at this time. If nutrition issues arise, please consult RD.   Levon Hedger MS, RD, LDN [phone] Pager [phone] After Hours Pager

## 2012-03-01 NOTE — Progress Notes (Signed)
Talked to patient about DCP; patient is independent prior to admission; PCP is Dr Andi Devon - CM encouraged patient to call her after discharge to make a post hospitalization visit; Patient gets her prescriptions filled at [facility] and states that she does not have any problems getting her medication; CM asked patient about her frequent ER visits - patient stated that she has GI problems and goes to the ER when she cannot control the pain; Her GI MD is DR Loreta Ave and was recently referred to a GI physician at [facility]; Patient has private insurance - [employer]; Patient is cheerful and is excited about possibly going home today; No needs identified at this time; B [employer]

## 2012-03-02 LAB — MRSA CULTURE

## 2012-03-10 ENCOUNTER — Encounter (HOSPITAL_COMMUNITY): Payer: Self-pay | Admitting: Emergency Medicine

## 2012-03-10 ENCOUNTER — Emergency Department (HOSPITAL_COMMUNITY)
Admission: EM | Admit: 2012-03-10 | Discharge: 2012-03-10 | Disposition: A | Discharge status: Home / Self Care | Payer: 59 | Attending: Emergency Medicine | Admitting: Emergency Medicine

## 2012-03-10 ENCOUNTER — Emergency Department (HOSPITAL_COMMUNITY): Payer: 59

## 2012-03-10 DIAGNOSIS — K3184 Gastroparesis: Secondary | ICD-10-CM | POA: Insufficient documentation

## 2012-03-10 DIAGNOSIS — R739 Hyperglycemia, unspecified: Secondary | ICD-10-CM

## 2012-03-10 DIAGNOSIS — Z79899 Other long term (current) drug therapy: Secondary | ICD-10-CM | POA: Insufficient documentation

## 2012-03-10 DIAGNOSIS — I10 Essential (primary) hypertension: Secondary | ICD-10-CM | POA: Insufficient documentation

## 2012-03-10 DIAGNOSIS — Z794 Long term (current) use of insulin: Secondary | ICD-10-CM | POA: Insufficient documentation

## 2012-03-10 DIAGNOSIS — Z3202 Encounter for pregnancy test, result negative: Secondary | ICD-10-CM | POA: Insufficient documentation

## 2012-03-10 DIAGNOSIS — E1069 Type 1 diabetes mellitus with other specified complication: Secondary | ICD-10-CM | POA: Insufficient documentation

## 2012-03-10 LAB — COMPREHENSIVE METABOLIC PANEL
ALT: 47 U/L — ABNORMAL HIGH (ref 0–35)
AST: 22 U/L (ref 0–37)
Albumin: 4.6 g/dL (ref 3.5–5.2)
Alkaline Phosphatase: 120 U/L — ABNORMAL HIGH (ref 39–117)
BUN: 15 mg/dL (ref 6–23)
CO2: 19 mEq/L (ref 19–32)
Calcium: 10.6 mg/dL — ABNORMAL HIGH (ref 8.4–10.5)
Chloride: 86 mEq/L — ABNORMAL LOW (ref 96–112)
Creatinine, Ser: 0.44 mg/dL — ABNORMAL LOW (ref 0.50–1.10)
GFR calc Af Amer: >90 mL/min (ref >90)
GFR calc non Af Amer: >90 mL/min (ref >90)
Glucose, Bld: 520 mg/dL — ABNORMAL HIGH (ref 70–99)
Potassium: 4.5 mEq/L (ref 3.5–5.1)
Sodium: 129 mEq/L — ABNORMAL LOW (ref 135–145)
Total Bilirubin: 0.4 mg/dL (ref 0.3–1.2)
Total Protein: 8.7 g/dL — ABNORMAL HIGH (ref 6.0–8.3)

## 2012-03-10 LAB — URINALYSIS, MICROSCOPIC ONLY
Bilirubin Urine: NEGATIVE
Glucose, UA: >1000 mg/dL — AB
Hgb urine dipstick: NEGATIVE
Ketones, ur: 40 mg/dL — AB
Leukocytes, UA: NEGATIVE
Nitrite: NEGATIVE
Protein, ur: NEGATIVE mg/dL
Specific Gravity, Urine: 1.035 — ABNORMAL HIGH (ref 1.005–1.030)
Urobilinogen, UA: 0.2 mg/dL (ref 0.0–1.0)
pH: 5 (ref 5.0–8.0)

## 2012-03-10 LAB — CBC WITH DIFFERENTIAL/PLATELET
Basophils Absolute: 0 10*3/uL (ref 0.0–0.1)
Basophils Relative: 1 % (ref 0–1)
Eosinophils Absolute: 0 10*3/uL (ref 0.0–0.7)
Eosinophils Relative: 0 % (ref 0–5)
HCT: 39.6 % (ref 36.0–46.0)
Hemoglobin: 14.4 g/dL (ref 12.0–15.0)
Lymphocytes Relative: 20 % (ref 12–46)
Lymphs Abs: 1 10*3/uL (ref 0.7–4.0)
MCH: 31 pg (ref 26.0–34.0)
MCHC: 36.4 g/dL — ABNORMAL HIGH (ref 30.0–36.0)
MCV: 85.2 fL (ref 78.0–100.0)
Monocytes Absolute: 0.2 10*3/uL (ref 0.1–1.0)
Monocytes Relative: 3 % (ref 3–12)
Neutro Abs: 3.9 10*3/uL (ref 1.7–7.7)
Neutrophils Relative %: 76 % (ref 43–77)
Platelets: 315 10*3/uL (ref 150–400)
RBC: 4.65 MIL/uL (ref 3.87–5.11)
RDW: 12.5 % (ref 11.5–15.5)
WBC: 5.2 10*3/uL (ref 4.0–10.5)

## 2012-03-10 LAB — BLOOD GAS, VENOUS
Acid-base deficit: 6.3 mmol/L — ABNORMAL HIGH (ref 0.0–2.0)
Bicarbonate: 19.6 mEq/L — ABNORMAL LOW (ref 20.0–24.0)
O2 Saturation: 42.2 %
Patient temperature: 98.6
TCO2: 17.9 mmol/L (ref 0–100)
pCO2, Ven: 41.7 mmHg — ABNORMAL LOW (ref 45.0–50.0)
pH, Ven: 7.293 (ref 7.250–7.300)
pO2, Ven: 26.2 mmHg — CL (ref 30.0–45.0)

## 2012-03-10 LAB — GLUCOSE, CAPILLARY
Glucose-Capillary: 304 mg/dL — ABNORMAL HIGH (ref 70–99)
Glucose-Capillary: 267 mg/dL — ABNORMAL HIGH (ref 70–99)

## 2012-03-10 LAB — LIPASE, BLOOD: Lipase: 16 U/L (ref 11–59)

## 2012-03-10 LAB — POCT PREGNANCY, URINE: Preg Test, Ur: NEGATIVE

## 2012-03-10 IMAGING — CR DG ABDOMEN ACUTE W/ 1V CHEST
3 series · 3 of 3 positions shown · non-contrast
Comparison: Chest radiograph [DATE], abdominal radiographs
[DATE]

CLINICAL DATA: Abdominal pain, nausea, weakness

ACUTE ABDOMEN SERIES (ABDOMEN 2 VIEW & CHEST 1 VIEW)

[w chest pa]
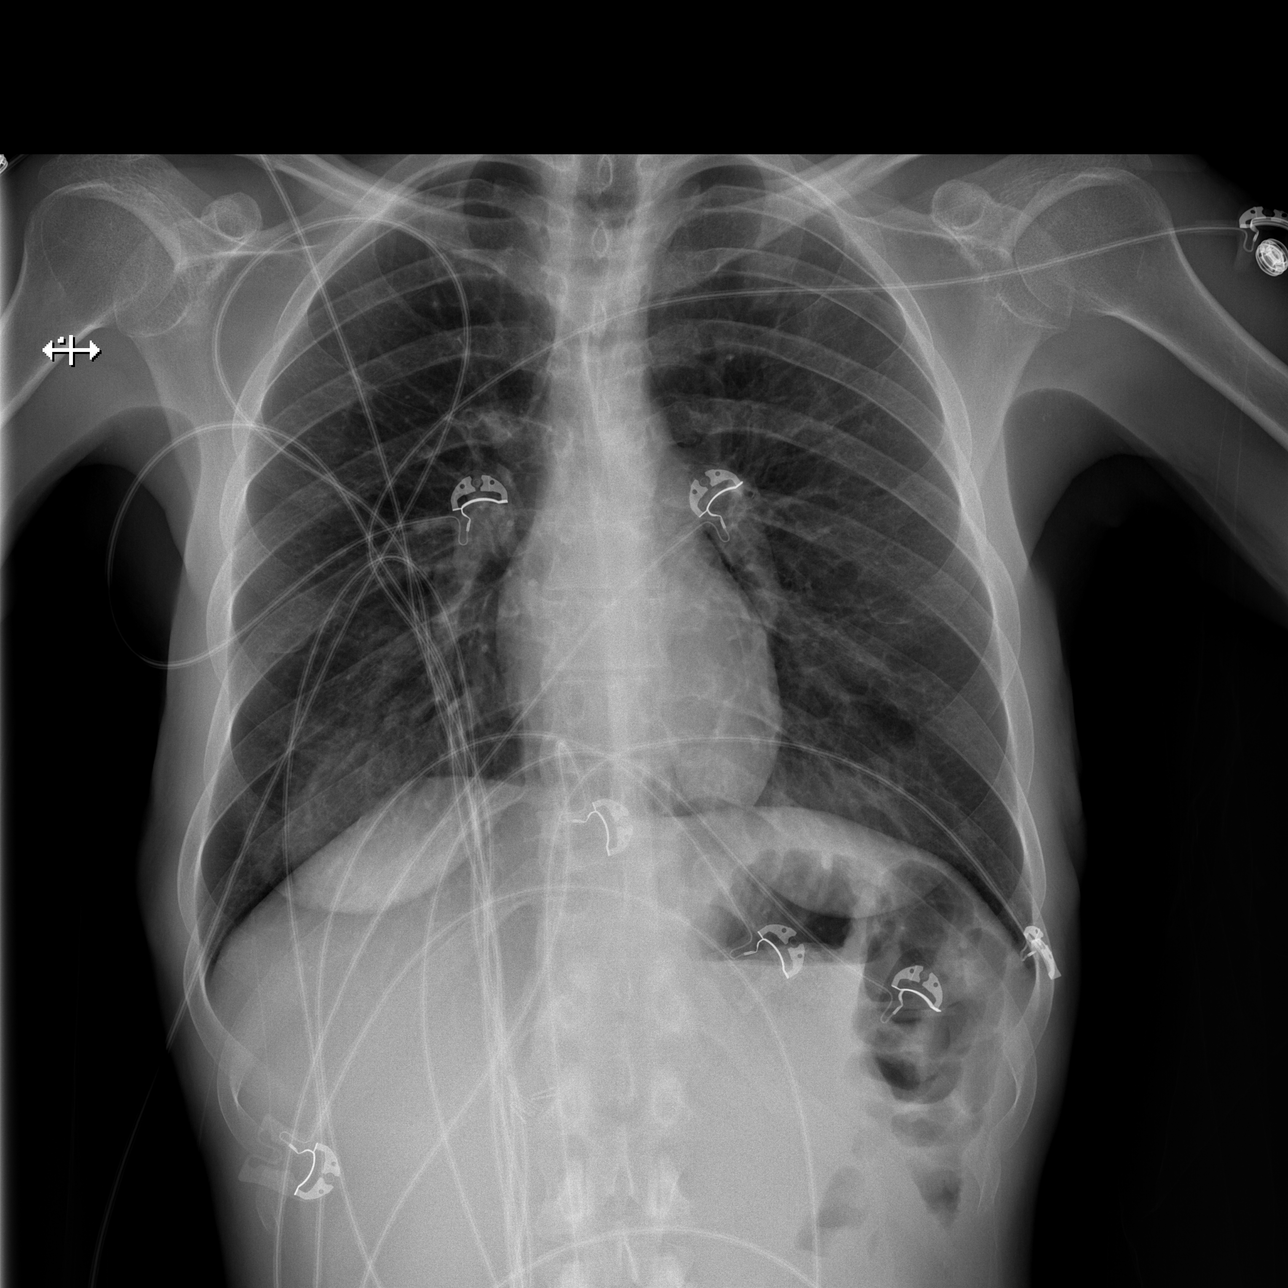

[w abdomen upright]
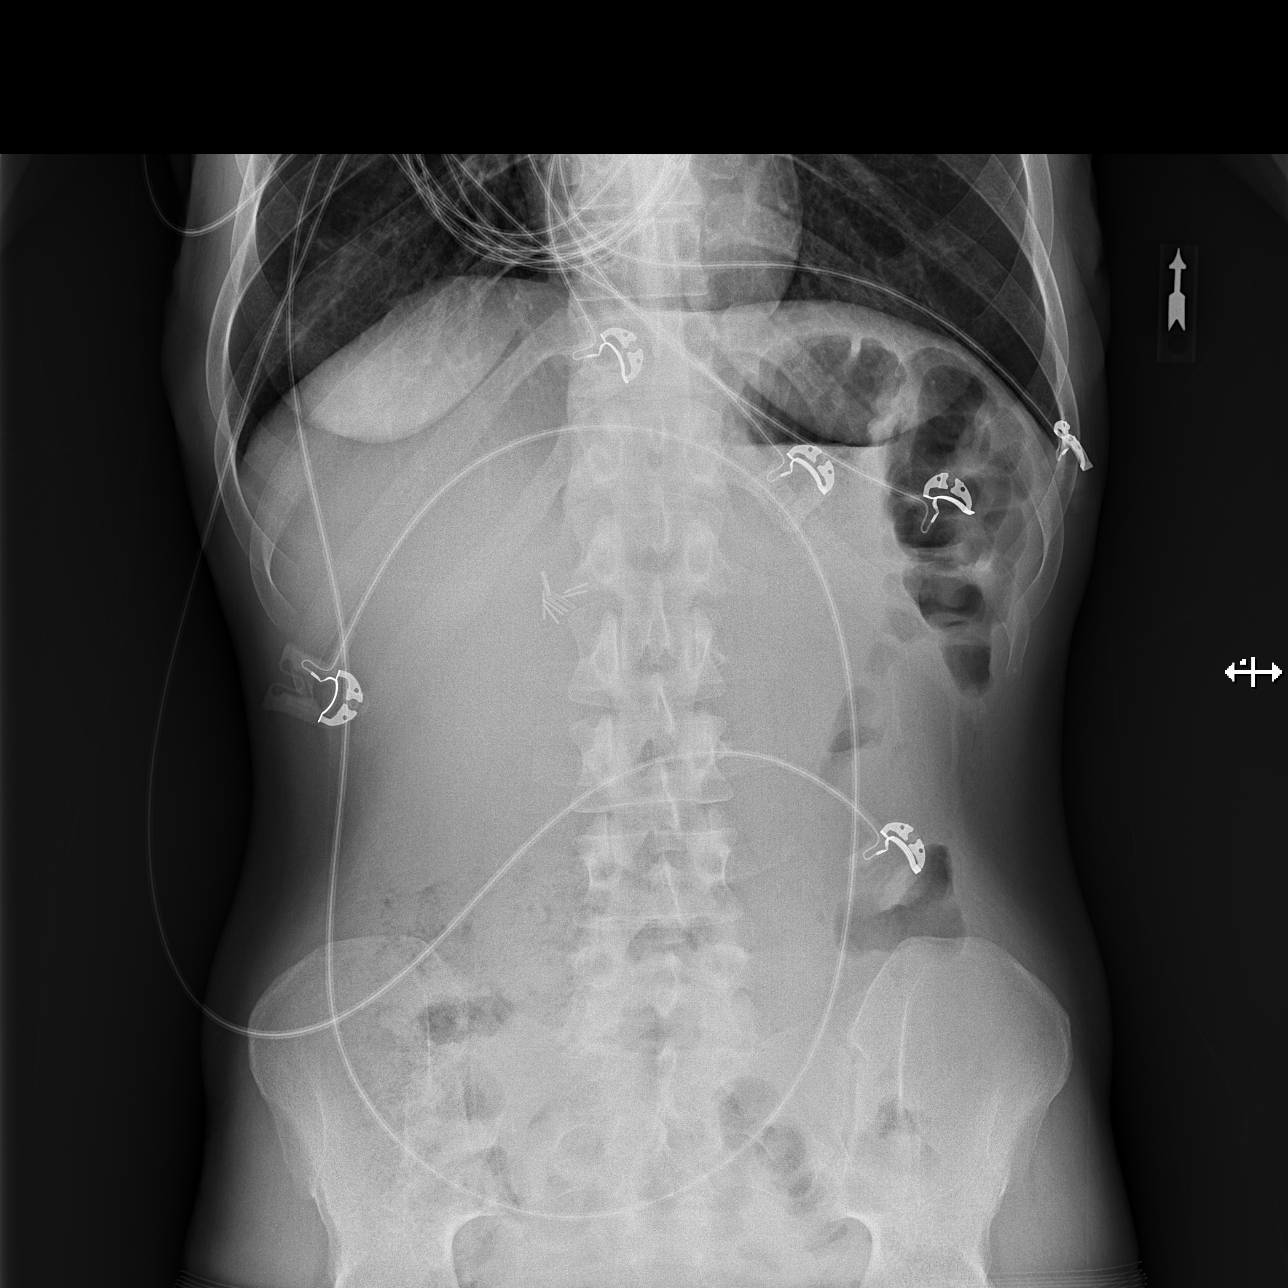

[t abdomen supine]
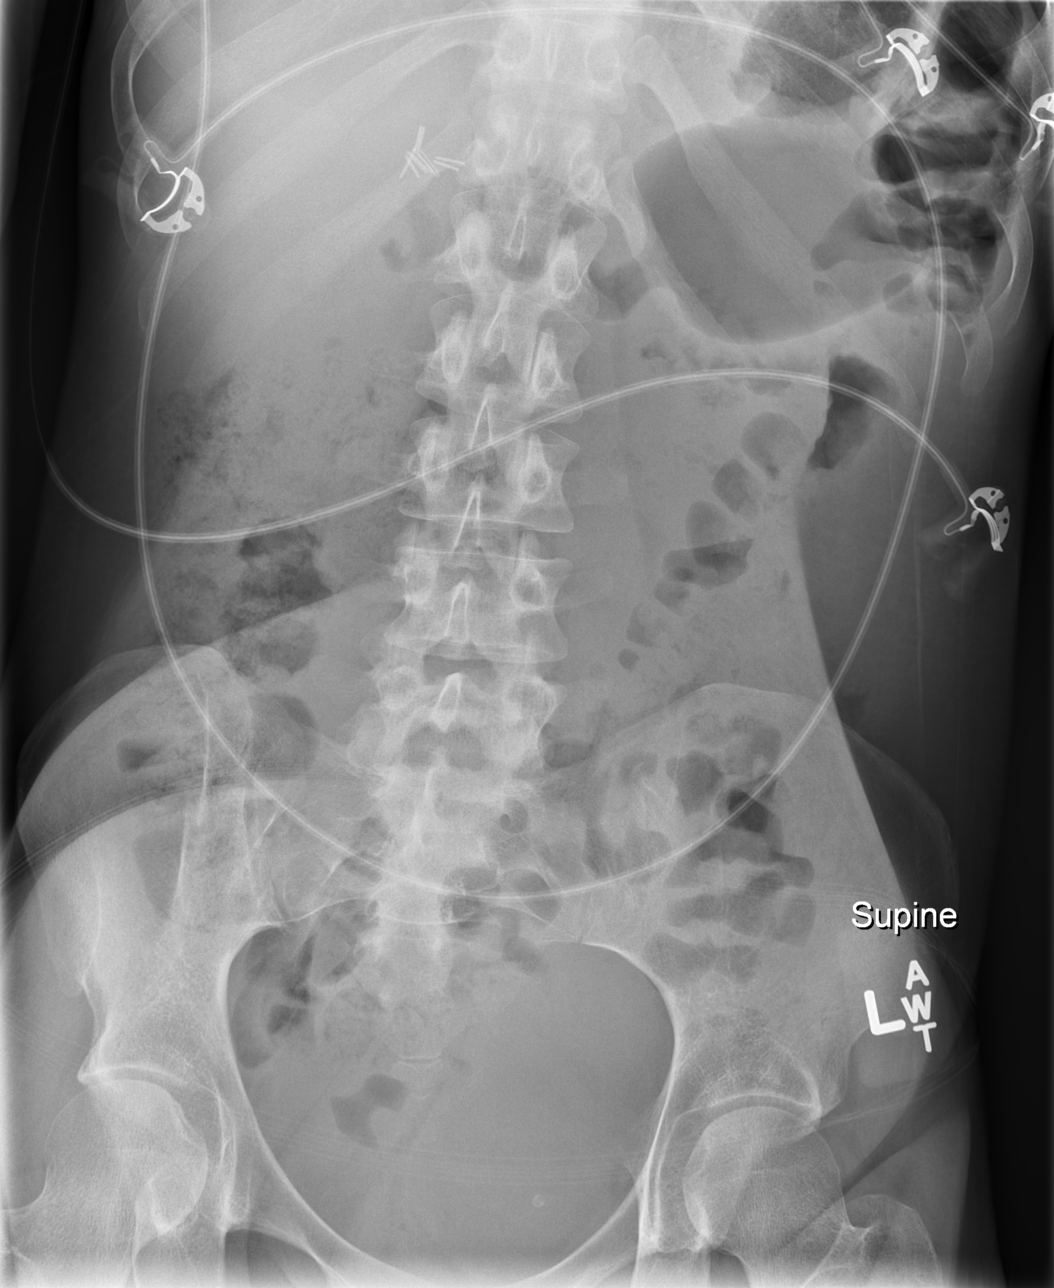

[3 of 3 positions shown; findings below may reference images not displayed]

FINDINGS: Normal heart size, mediastinal contours, and pulmonary vascularity.
Mildly hyperinflated lungs with bronchitic changes.
Lungs clear.
No pleural effusion or pneumothorax.
Numerous cardiac monitoring leads project over chest and abdomen.
Surgical clips right upper quadrant question cholecystectomy.
Nonobstructive bowel gas pattern.
No bowel dilatation, bowel wall thickening, or free intraperitoneal
air.
Increased attenuation of the abdomen with medial displacement of
bowel loops from the flanks on supine view suggesting presence of
ascites.
No acute osseous findings or definite urinary tract calcification.
IMPRESSION: Hyperinflated lungs with mild chronic bronchitic changes.
Nonobstructive bowel gas pattern.
Question ascites.

## 2012-03-10 MED ORDER — SODIUM CHLORIDE 0.9 % IV BOLUS (SEPSIS)
1000.0000 mL | Freq: Once | INTRAVENOUS | Status: AC
  Administered 2012-03-10: 1000 mL via INTRAVENOUS

## 2012-03-10 MED ORDER — SODIUM CHLORIDE 0.9 % IV SOLN
INTRAVENOUS | Status: DC
  Administered 2012-03-10 (×2): via INTRAVENOUS

## 2012-03-10 MED ORDER — ONDANSETRON HCL 4 MG/2ML IJ SOLN
4.0000 mg | Freq: Once | INTRAMUSCULAR | Status: DC
  Filled 2012-03-10: qty 2

## 2012-03-10 MED ORDER — SODIUM CHLORIDE 0.9 % IV SOLN: 1000.0000 mL | Freq: Once | INTRAVENOUS | Status: DC

## 2012-03-10 MED ORDER — HYDROMORPHONE HCL PF 1 MG/ML IJ SOLN
0.5000 mg | Freq: Once | INTRAMUSCULAR | Status: AC
  Administered 2012-03-10: 0.5 mg via INTRAVENOUS
  Filled 2012-03-10: qty 1

## 2012-03-10 MED ORDER — INSULIN ASPART 100 UNIT/ML ~~LOC~~ SOLN
10.0000 [IU] | Freq: Once | SUBCUTANEOUS | Status: AC
  Administered 2012-03-10: 10 [IU] via SUBCUTANEOUS
  Filled 2012-03-10: qty 1

## 2012-03-10 MED ORDER — ONDANSETRON HCL 4 MG/2ML IJ SOLN
4.0000 mg | Freq: Once | INTRAMUSCULAR | Status: AC
  Administered 2012-03-10: 4 mg via INTRAVENOUS

## 2012-03-10 NOTE — ED Notes (Signed)
Pt presenting to ed with c/o abdominal pain with some nausea no vomiting no diarrhea

## 2012-03-10 NOTE — ED Provider Notes (Signed)
History     CSN: [id number]  Arrival date & time 03/10/12  1437   First MD Initiated Contact with Patient 03/10/12 1502      Chief Complaint  Patient presents with  . Abdominal Pain    (Consider location/radiation/quality/duration/timing/severity/associated sxs/prior treatment) Patient is a 21 y.o. female presenting with abdominal pain. The history is provided by the patient.  Abdominal Pain The primary symptoms of the illness include abdominal pain.  pt here with nausea and no vomiting--h/o gastroparesis and this is similar--no fever or diarrhea--no vaginal or urinary sx--nothing makes sx better or worse--pt non compliant  With insulin--h/o dka and this is also similar ( polyuria and polydipsia)  Past Medical History  Diagnosis Date  . Diabetes mellitus   . Hypertension   . Diabetes type 1, uncontrolled 11/14/2011    Since age 63  . Gastroparesis     Past Surgical History  Procedure Date  . Ankle surgery   . Cholecystectomy 11/15/2011    Procedure: LAPAROSCOPIC CHOLECYSTECTOMY WITH INTRAOPERATIVE CHOLANGIOGRAM;  Surgeon: [name], MD;  Location: [facility];  Service: General;  Laterality: N/A;  . Laparoscopy 11/23/2011    Procedure: LAPAROSCOPY DIAGNOSTIC;  Surgeon: Edward Jolly, MD;  Location: [facility];  Service: General;  Laterality: N/A;  . Esophagogastroduodenoscopy 12/03/2011    Procedure: ESOPHAGOGASTRODUODENOSCOPY (EGD);  Surgeon: Beryle Beams, MD;  Location: [facility];  Service: Endoscopy;  Laterality: N/A;    Family History  Problem Relation Age of Onset  . Diabetes Mother   . Hypertension Father     History  Substance Use Topics  . Smoking status: Never Smoker   . Smokeless tobacco: Never Used  . Alcohol Use: No     Comment: Occasional    OB History    Grav Para Term Preterm Abortions TAB SAB Ect Mult Living                  Review of Systems  Gastrointestinal: Positive for abdominal pain.  All other systems reviewed and are  negative.    Allergies  Other  Home Medications   Current Outpatient Rx  Name  Route  Sig  Dispense  Refill  . ACYCLOVIR 400 MG PO TABS   Oral   Take 400 mg by mouth 2 (two) times daily.         Marland Kitchen ALPRAZOLAM 0.25 MG PO TABS   Oral   Take 1 tablet (0.25 mg total) by mouth at bedtime as needed for sleep.   30 tablet   0   . CYCLOBENZAPRINE HCL 10 MG PO TABS   Oral   Take 10 mg by mouth at bedtime.         Marland Kitchen DICYCLOMINE HCL 10 MG PO CAPS   Oral   Take 10 mg by mouth 4 (four) times daily -  before meals and at bedtime.         . ENALAPRIL MALEATE 10 MG PO TABS   Oral   Take 10 mg by mouth every evening.          Marland Kitchen GABAPENTIN 300 MG PO CAPS   Oral   Take 300 mg by mouth at bedtime.         . INSULIN ASPART 100 UNIT/ML [place] SOLN   Subcutaneous   Inject 0-15 Units into the skin 3 (three) times daily before meals. 5 u with each meal + sliding scale: CBG 70 - 120: 0 u: CBG 121 - 150: 2 u; CBG 151 -  200: 3 u; CBG 201 - 250: 5 u; CBG 251 - 300: 8 u;CBG 301 - 350: 11 u; CBG 351 - 400: 15 u; CBG > 400 Call MD         . INSULIN GLARGINE 100 UNIT/ML [place] SOLN   Subcutaneous   Inject 40 Units into the skin at bedtime.   10 mL   12   . METOCLOPRAMIDE HCL 10 MG PO TABS   Oral   Take 10 mg by mouth 4 (four) times daily.         Marland Kitchen METOPROLOL TARTRATE 12.5 MG HALF TABLET   Oral   Take 0.5 tablets (12.5 mg total) by mouth 2 (two) times daily.   30 tablet   0   . ADULT MULTIVITAMIN W/MINERALS CH   Oral   Take 1 tablet by mouth daily.         Marland Kitchen OMEPRAZOLE 40 MG PO CPDR   Oral   Take 40 mg by mouth daily.         Marland Kitchen PROMETHAZINE HCL 25 MG PO TABS   Oral   Take 25 mg by mouth every 6 (six) hours as needed. For nausea           BP 126/71  Pulse 138  Temp 98.1 F (36.7 C) (Oral)  Resp 18  SpO2 100%  LMP 09/29/2011  Physical Exam  Nursing note and vitals reviewed. Constitutional: She is oriented to person, place, and time. She appears well-developed  and well-nourished.  Non-toxic appearance. No distress.  HENT:  Head: Normocephalic and atraumatic.  Eyes: Conjunctivae normal, EOM and lids are normal. Pupils are equal, round, and reactive to light.  Neck: Normal range of motion. Neck supple. No tracheal deviation present. No mass present.  Cardiovascular: Regular rhythm and normal heart sounds.  Tachycardia present.  Exam reveals no gallop.   No murmur heard. Pulmonary/Chest: Effort normal and breath sounds normal. No stridor. No respiratory distress. She has no decreased breath sounds. She has no wheezes. She has no rhonchi. She has no rales.  Abdominal: Soft. Normal appearance and bowel sounds are normal. She exhibits no distension. There is no tenderness. There is no rebound and no CVA tenderness.  Musculoskeletal: Normal range of motion. She exhibits no edema and no tenderness.  Neurological: She is alert and oriented to person, place, and time. She has normal strength. No cranial nerve deficit or sensory deficit. GCS eye subscore is 4. GCS verbal subscore is 5. GCS motor subscore is 6.  Skin: Skin is warm and dry. No abrasion and no rash noted.  Psychiatric: She has a normal mood and affect. Her speech is normal and behavior is normal.    ED Course  Procedures (including critical care time)   Labs Reviewed  CBC WITH DIFFERENTIAL  COMPREHENSIVE METABOLIC PANEL  LIPASE, BLOOD  URINALYSIS, MICROSCOPIC ONLY  BLOOD GAS, VENOUS   No results found.   No diagnosis found.    MDM   Date: 03/10/2012  Rate: 124  Rhythm: sinus tachycardia  QRS Axis: normal  Intervals: normal  ST/T Wave abnormalities: nonspecific ST changes  Conduction Disutrbances:none  Narrative Interpretation:   Old EKG Reviewed: none available  9:34 PM Patient given IV fluids here and insulin. She has no signs of DKA at this time. Heart has improved although she knows that her baseline heart rate is 118. She was offered admission and will like to go home at  this time. She is stable for discharge to  CRITICAL CARE Performed by: Leota Jacobsen   Total critical care time: 35  Critical care time was exclusive of separately billable procedures and treating other patients.  Critical care was necessary to treat or prevent imminent or life-threatening deterioration.  Critical care was time spent personally by me on the following activities: development of treatment plan with patient and/or surrogate as well as nursing, discussions with consultants, evaluation of patient's response to treatment, examination of patient, obtaining history from patient or surrogate, ordering and performing treatments and interventions, ordering and review of laboratory studies, ordering and review of radiographic studies, pulse oximetry and re-evaluation of patient's condition.           Leota Jacobsen, MD 03/10/12 2134

## 2012-03-15 ENCOUNTER — Encounter (HOSPITAL_COMMUNITY): Payer: Self-pay | Admitting: *Deleted

## 2012-03-15 ENCOUNTER — Observation Stay (HOSPITAL_COMMUNITY)
Admission: EM | Admit: 2012-03-15 | Discharge: 2012-03-16 | Disposition: A | Discharge status: Home / Self Care | Payer: 59 | Attending: Internal Medicine | Admitting: Internal Medicine

## 2012-03-15 DIAGNOSIS — E1101 Type 2 diabetes mellitus with hyperosmolarity with coma: Secondary | ICD-10-CM

## 2012-03-15 DIAGNOSIS — I152 Hypertension secondary to endocrine disorders: Secondary | ICD-10-CM

## 2012-03-15 DIAGNOSIS — R079 Chest pain, unspecified: Secondary | ICD-10-CM

## 2012-03-15 DIAGNOSIS — E1065 Type 1 diabetes mellitus with hyperglycemia: Secondary | ICD-10-CM

## 2012-03-15 DIAGNOSIS — E1159 Type 2 diabetes mellitus with other circulatory complications: Secondary | ICD-10-CM

## 2012-03-15 DIAGNOSIS — I498 Other specified cardiac arrhythmias: Secondary | ICD-10-CM

## 2012-03-15 DIAGNOSIS — R0600 Dyspnea, unspecified: Secondary | ICD-10-CM

## 2012-03-15 DIAGNOSIS — R824 Acetonuria: Secondary | ICD-10-CM

## 2012-03-15 DIAGNOSIS — K3184 Gastroparesis: Secondary | ICD-10-CM

## 2012-03-15 DIAGNOSIS — E1100 Type 2 diabetes mellitus with hyperosmolarity without nonketotic hyperglycemic-hyperosmolar coma (NKHHC): Secondary | ICD-10-CM

## 2012-03-15 DIAGNOSIS — R109 Unspecified abdominal pain: Secondary | ICD-10-CM

## 2012-03-15 DIAGNOSIS — R000 Tachycardia, unspecified: Secondary | ICD-10-CM | POA: Diagnosis present

## 2012-03-15 DIAGNOSIS — K5900 Constipation, unspecified: Secondary | ICD-10-CM

## 2012-03-15 DIAGNOSIS — E873 Alkalosis: Secondary | ICD-10-CM

## 2012-03-15 DIAGNOSIS — R739 Hyperglycemia, unspecified: Secondary | ICD-10-CM

## 2012-03-15 DIAGNOSIS — R112 Nausea with vomiting, unspecified: Secondary | ICD-10-CM

## 2012-03-15 DIAGNOSIS — E871 Hypo-osmolality and hyponatremia: Secondary | ICD-10-CM

## 2012-03-15 DIAGNOSIS — K6289 Other specified diseases of anus and rectum: Secondary | ICD-10-CM

## 2012-03-15 DIAGNOSIS — E109 Type 1 diabetes mellitus without complications: Principal | ICD-10-CM | POA: Insufficient documentation

## 2012-03-15 DIAGNOSIS — I10 Essential (primary) hypertension: Secondary | ICD-10-CM

## 2012-03-15 DIAGNOSIS — Z794 Long term (current) use of insulin: Secondary | ICD-10-CM | POA: Insufficient documentation

## 2012-03-15 DIAGNOSIS — R1013 Epigastric pain: Secondary | ICD-10-CM

## 2012-03-15 DIAGNOSIS — IMO0002 Reserved for concepts with insufficient information to code with codable children: Secondary | ICD-10-CM

## 2012-03-15 DIAGNOSIS — Z79899 Other long term (current) drug therapy: Secondary | ICD-10-CM | POA: Insufficient documentation

## 2012-03-15 LAB — URINALYSIS, ROUTINE W REFLEX MICROSCOPIC
Bilirubin Urine: NEGATIVE
Glucose, UA: >1000 mg/dL — AB
Hgb urine dipstick: NEGATIVE
Ketones, ur: 40 mg/dL — AB
Leukocytes, UA: NEGATIVE
Nitrite: NEGATIVE
Protein, ur: NEGATIVE mg/dL
Specific Gravity, Urine: 1.041 — ABNORMAL HIGH (ref 1.005–1.030)
Urobilinogen, UA: 0.2 mg/dL (ref 0.0–1.0)
pH: 5.5 (ref 5.0–8.0)

## 2012-03-15 LAB — BLOOD GAS, ARTERIAL
Acid-base deficit: 0.6 mmol/L (ref 0.0–2.0)
Bicarbonate: 20.8 mEq/L (ref 20.0–24.0)
Drawn by: 244901
FIO2: 0.21 %
O2 Saturation: 99 %
Patient temperature: 98.6
TCO2: 17.6 mmol/L (ref 0–100)
pCO2 arterial: 27 mmHg — ABNORMAL LOW (ref 35.0–45.0)
pH, Arterial: 7.499 — ABNORMAL HIGH (ref 7.350–7.450)
pO2, Arterial: 114 mmHg — ABNORMAL HIGH (ref 80.0–100.0)

## 2012-03-15 LAB — CBC WITH DIFFERENTIAL/PLATELET
Basophils Absolute: 0 10*3/uL (ref 0.0–0.1)
Basophils Relative: 1 % (ref 0–1)
Eosinophils Absolute: 0.1 10*3/uL (ref 0.0–0.7)
Eosinophils Relative: 2 % (ref 0–5)
HCT: 41.3 % (ref 36.0–46.0)
Hemoglobin: 15 g/dL (ref 12.0–15.0)
Lymphocytes Relative: 42 % (ref 12–46)
Lymphs Abs: 1.9 10*3/uL (ref 0.7–4.0)
MCH: 30.5 pg (ref 26.0–34.0)
MCHC: 36.3 g/dL — ABNORMAL HIGH (ref 30.0–36.0)
MCV: 83.9 fL (ref 78.0–100.0)
Monocytes Absolute: 0.2 10*3/uL (ref 0.1–1.0)
Monocytes Relative: 5 % (ref 3–12)
Neutro Abs: 2.3 10*3/uL (ref 1.7–7.7)
Neutrophils Relative %: 51 % (ref 43–77)
Platelets: 284 10*3/uL (ref 150–400)
RBC: 4.92 MIL/uL (ref 3.87–5.11)
RDW: 12 % (ref 11.5–15.5)
WBC: 4.5 10*3/uL (ref 4.0–10.5)

## 2012-03-15 LAB — COMPREHENSIVE METABOLIC PANEL
ALT: 24 U/L (ref 0–35)
AST: 24 U/L (ref 0–37)
Albumin: 4.4 g/dL (ref 3.5–5.2)
Alkaline Phosphatase: 140 U/L — ABNORMAL HIGH (ref 39–117)
BUN: 10 mg/dL (ref 6–23)
CO2: 27 mEq/L (ref 19–32)
Calcium: 10.5 mg/dL (ref 8.4–10.5)
Chloride: 86 mEq/L — ABNORMAL LOW (ref 96–112)
Creatinine, Ser: 0.41 mg/dL — ABNORMAL LOW (ref 0.50–1.10)
GFR calc Af Amer: >90 mL/min (ref >90)
GFR calc non Af Amer: >90 mL/min (ref >90)
Glucose, Bld: 656 mg/dL (ref 70–99)
Potassium: 4.4 mEq/L (ref 3.5–5.1)
Sodium: 129 mEq/L — ABNORMAL LOW (ref 135–145)
Total Bilirubin: 0.4 mg/dL (ref 0.3–1.2)
Total Protein: 8.4 g/dL — ABNORMAL HIGH (ref 6.0–8.3)

## 2012-03-15 LAB — PREGNANCY, URINE: Preg Test, Ur: NEGATIVE

## 2012-03-15 LAB — GLUCOSE, CAPILLARY
Glucose-Capillary: >600 mg/dL (ref 70–99)
Glucose-Capillary: 359 mg/dL — ABNORMAL HIGH (ref 70–99)
Glucose-Capillary: 290 mg/dL — ABNORMAL HIGH (ref 70–99)

## 2012-03-15 LAB — URINE MICROSCOPIC-ADD ON

## 2012-03-15 MED ORDER — PROMETHAZINE HCL 25 MG PO TABS: 25.0000 mg | ORAL_TABLET | Freq: Four times a day (QID) | ORAL | Status: DC | PRN

## 2012-03-15 MED ORDER — PANTOPRAZOLE SODIUM 40 MG PO TBEC: 40.0000 mg | DELAYED_RELEASE_TABLET | Freq: Every day | ORAL | Status: DC

## 2012-03-15 MED ORDER — PANTOPRAZOLE SODIUM 40 MG PO TBEC
40.0000 mg | DELAYED_RELEASE_TABLET | Freq: Every day | ORAL | Status: DC
  Administered 2012-03-16: 40 mg via ORAL
  Filled 2012-03-15: qty 1

## 2012-03-15 MED ORDER — HYDROMORPHONE HCL PF 1 MG/ML IJ SOLN
1.0000 mg | Freq: Once | INTRAMUSCULAR | Status: AC
  Administered 2012-03-15: 1 mg via INTRAVENOUS
  Filled 2012-03-15: qty 1

## 2012-03-15 MED ORDER — ONDANSETRON HCL 4 MG PO TABS: 4.0000 mg | ORAL_TABLET | Freq: Four times a day (QID) | ORAL | Status: DC | PRN

## 2012-03-15 MED ORDER — SODIUM CHLORIDE 0.9 % IV BOLUS (SEPSIS)
1000.0000 mL | Freq: Once | INTRAVENOUS | Status: AC
  Administered 2012-03-16: 1000 mL via INTRAVENOUS

## 2012-03-15 MED ORDER — SODIUM CHLORIDE 0.9 % IV SOLN
INTRAVENOUS | Status: DC
  Filled 2012-03-15: qty 1

## 2012-03-15 MED ORDER — METOCLOPRAMIDE HCL 10 MG PO TABS
10.0000 mg | ORAL_TABLET | Freq: Four times a day (QID) | ORAL | Status: DC
Start: 2012-03-16 — End: 2012-03-16
  Administered 2012-03-16 (×2): 10 mg via ORAL
  Filled 2012-03-15 (×5): qty 1

## 2012-03-15 MED ORDER — SODIUM CHLORIDE 0.9 % IV SOLN
INTRAVENOUS | Status: DC
  Administered 2012-03-15: 6 [IU]/h via INTRAVENOUS
  Filled 2012-03-15: qty 1

## 2012-03-15 MED ORDER — ONDANSETRON HCL 4 MG/2ML IJ SOLN
4.0000 mg | Freq: Four times a day (QID) | INTRAMUSCULAR | Status: DC | PRN
  Administered 2012-03-16: 4 mg via INTRAVENOUS
  Filled 2012-03-15: qty 2

## 2012-03-15 MED ORDER — CYCLOBENZAPRINE HCL 10 MG PO TABS
10.0000 mg | ORAL_TABLET | Freq: Every day | ORAL | Status: DC
  Administered 2012-03-16: 10 mg via ORAL
  Filled 2012-03-15 (×2): qty 1

## 2012-03-15 MED ORDER — ALPRAZOLAM 0.25 MG PO TABS: 0.2500 mg | ORAL_TABLET | Freq: Every evening | ORAL | Status: DC | PRN

## 2012-03-15 MED ORDER — ACYCLOVIR 400 MG PO TABS
400.0000 mg | ORAL_TABLET | Freq: Two times a day (BID) | ORAL | Status: DC
  Administered 2012-03-16 (×2): 400 mg via ORAL
  Filled 2012-03-15 (×3): qty 1

## 2012-03-15 MED ORDER — INSULIN GLARGINE 100 UNIT/ML ~~LOC~~ SOLN
40.0000 [IU] | Freq: Every day | SUBCUTANEOUS | Status: DC
  Administered 2012-03-16: 40 [IU] via SUBCUTANEOUS

## 2012-03-15 MED ORDER — DICYCLOMINE HCL 10 MG PO CAPS
10.0000 mg | ORAL_CAPSULE | Freq: Three times a day (TID) | ORAL | Status: DC
  Administered 2012-03-16 (×2): 10 mg via ORAL
  Filled 2012-03-15 (×5): qty 1

## 2012-03-15 MED ORDER — SODIUM CHLORIDE 0.9 % IV SOLN
1000.0000 mL | INTRAVENOUS | Status: DC
  Administered 2012-03-15: 1000 mL via INTRAVENOUS

## 2012-03-15 MED ORDER — SODIUM CHLORIDE 0.9 % IV BOLUS (SEPSIS)
500.0000 mL | Freq: Once | INTRAVENOUS | Status: AC
  Administered 2012-03-16: 500 mL via INTRAVENOUS

## 2012-03-15 MED ORDER — GABAPENTIN 300 MG PO CAPS
300.0000 mg | ORAL_CAPSULE | Freq: Every day | ORAL | Status: DC
  Administered 2012-03-16: 300 mg via ORAL
  Filled 2012-03-15 (×2): qty 1

## 2012-03-15 MED ORDER — INSULIN ASPART 100 UNIT/ML ~~LOC~~ SOLN: 0.0000 [IU] | Freq: Three times a day (TID) | SUBCUTANEOUS | Status: DC

## 2012-03-15 MED ORDER — ENALAPRIL MALEATE 10 MG PO TABS
10.0000 mg | ORAL_TABLET | Freq: Every evening | ORAL | Status: DC
  Filled 2012-03-15: qty 1

## 2012-03-15 MED ORDER — SODIUM CHLORIDE 0.9 % IV BOLUS (SEPSIS)
1000.0000 mL | Freq: Once | INTRAVENOUS | Status: AC
  Administered 2012-03-15: 1000 mL via INTRAVENOUS

## 2012-03-15 MED ORDER — ALUM & MAG HYDROXIDE-SIMETH 200-200-20 MG/5ML PO SUSP: 30.0000 mL | Freq: Four times a day (QID) | ORAL | Status: DC | PRN

## 2012-03-15 MED ORDER — ONDANSETRON HCL 4 MG/2ML IJ SOLN: 4.0000 mg | Freq: Three times a day (TID) | INTRAMUSCULAR | Status: AC | PRN

## 2012-03-15 MED ORDER — HEPARIN SODIUM (PORCINE) 5000 UNIT/ML IJ SOLN
5000.0000 [IU] | Freq: Three times a day (TID) | INTRAMUSCULAR | Status: DC
  Administered 2012-03-16 (×2): 5000 [IU] via SUBCUTANEOUS
  Filled 2012-03-15 (×5): qty 1

## 2012-03-15 MED ORDER — SODIUM CHLORIDE 0.9 % IV SOLN
INTRAVENOUS | Status: DC
  Administered 2012-03-16: via INTRAVENOUS

## 2012-03-15 NOTE — ED Provider Notes (Signed)
History    21 year old female with abdominal pain. Apparently patient mentioned chest pain to the triage nurse but patient is alert complaining of abdominal pain to me. Patient has a history of gastroparesis and states that her current pain is similar to previous. She is nauseated. No vomiting She has been having polyuria and polydipsia. Her blood sugars have been over 400 for the past several days. She reports that she has been rationing her insulin because she does not have enough to do take it as directed.  She states that she is running out and cannot have it refilled until Wednesday. No fevers or chills.   CSN: [id number]  Arrival date & time 03/15/12  0175   First MD Initiated Contact with Patient 03/15/12 2010      Chief Complaint  Patient presents with  . Chest Pain  . Hyperglycemia    (Consider location/radiation/quality/duration/timing/severity/associated sxs/prior treatment) HPI  Past Medical History  Diagnosis Date  . Diabetes mellitus   . Hypertension   . Diabetes type 1, uncontrolled 11/14/2011    Since age 40  . Gastroparesis     Past Surgical History  Procedure Date  . Ankle surgery   . Cholecystectomy 11/15/2011    Procedure: LAPAROSCOPIC CHOLECYSTECTOMY WITH INTRAOPERATIVE CHOLANGIOGRAM;  Surgeon: [name], MD;  Location: [facility];  Service: General;  Laterality: N/A;  . Laparoscopy 11/23/2011    Procedure: LAPAROSCOPY DIAGNOSTIC;  Surgeon: Edward Jolly, MD;  Location: [facility];  Service: General;  Laterality: N/A;  . Esophagogastroduodenoscopy 12/03/2011    Procedure: ESOPHAGOGASTRODUODENOSCOPY (EGD);  Surgeon: Beryle Beams, MD;  Location: [facility];  Service: Endoscopy;  Laterality: N/A;    Family History  Problem Relation Age of Onset  . Diabetes Mother   . Hypertension Father     History  Substance Use Topics  . Smoking status: Never Smoker   . Smokeless tobacco: Never Used  . Alcohol Use: Yes     Comment: Occasional    OB History     Grav Para Term Preterm Abortions TAB SAB Ect Mult Living                  Review of Systems  All systems reviewed and negative, other than as noted in HPI.   Allergies  Other  Home Medications   Current Outpatient Rx  Name  Route  Sig  Dispense  Refill  . ACYCLOVIR 400 MG PO TABS   Oral   Take 400 mg by mouth 2 (two) times daily.         Marland Kitchen ALPRAZOLAM 0.25 MG PO TABS   Oral   Take 1 tablet (0.25 mg total) by mouth at bedtime as needed for sleep.   30 tablet   0   . CYCLOBENZAPRINE HCL 10 MG PO TABS   Oral   Take 10 mg by mouth at bedtime.         Marland Kitchen DICYCLOMINE HCL 10 MG PO CAPS   Oral   Take 10 mg by mouth 4 (four) times daily -  before meals and at bedtime.         . ENALAPRIL MALEATE 10 MG PO TABS   Oral   Take 10 mg by mouth every evening.          Marland Kitchen GABAPENTIN 300 MG PO CAPS   Oral   Take 300 mg by mouth at bedtime.         . INSULIN ASPART 100 UNIT/ML [place] SOLN  Subcutaneous   Inject 0-15 Units into the skin 3 (three) times daily before meals. 5 u with each meal + sliding scale: CBG 70 - 120: 0 u: CBG 121 - 150: 2 u; CBG 151 - 200: 3 u; CBG 201 - 250: 5 u; CBG 251 - 300: 8 u;CBG 301 - 350: 11 u; CBG 351 - 400: 15 u; CBG > 400 Call MD         . INSULIN GLARGINE 100 UNIT/ML [place] SOLN   Subcutaneous   Inject 40 Units into the skin at bedtime.   10 mL   12   . METOCLOPRAMIDE HCL 10 MG PO TABS   Oral   Take 10 mg by mouth 4 (four) times daily.         Marland Kitchen OMEPRAZOLE 40 MG PO CPDR   Oral   Take 40 mg by mouth daily.         Marland Kitchen PROMETHAZINE HCL 25 MG PO TABS   Oral   Take 25 mg by mouth every 6 (six) hours as needed. For nausea           BP 136/93  Pulse 164  Temp 97.9 F (36.6 C) (Oral)  Resp 29  SpO2 99%  LMP 09/29/2011  Physical Exam  Nursing note and vitals reviewed. Constitutional: She appears well-developed.       Thin and frail appearing  HENT:  Head: Normocephalic and atraumatic.  Eyes: Conjunctivae normal are  normal. Pupils are equal, round, and reactive to light. Right eye exhibits no discharge. Left eye exhibits no discharge.  Neck: Neck supple.  Cardiovascular: Regular rhythm and normal heart sounds.  Exam reveals no gallop and no friction rub.   No murmur heard.      Significantly tachycardic with regular rhythm.  Pulmonary/Chest: Effort normal and breath sounds normal. No respiratory distress.  Abdominal: Soft. She exhibits no distension and no mass. There is no tenderness. There is no guarding.  Musculoskeletal: She exhibits no edema and no tenderness.  Neurological: She is alert.  Skin: Skin is warm and dry. She is not diaphoretic.  Psychiatric: She has a normal mood and affect. Her behavior is normal. Thought content normal.    ED Course  Procedures (including critical care time)  CRITICAL CARE Performed by: Virgel Manifold   Total critical care time: 35 minutes  Critical care time was exclusive of separately billable procedures and treating other patients.  Critical care was necessary to treat or prevent imminent or life-threatening deterioration.  Critical care was time spent personally by me on the following activities: development of treatment plan with patient and/or surrogate as well as nursing, discussions with consultants, evaluation of patient's response to treatment, examination of patient, obtaining history from patient or surrogate, ordering and performing treatments and interventions, ordering and review of laboratory studies, ordering and review of radiographic studies, pulse oximetry and re-evaluation of patient's condition.   Labs Reviewed  GLUCOSE, CAPILLARY - Abnormal; Notable for the following:    Glucose-Capillary >600 (*)     All other components within normal limits  CBC WITH DIFFERENTIAL - Abnormal; Notable for the following:    MCHC 36.3 (*)     All other components within normal limits  COMPREHENSIVE METABOLIC PANEL - Abnormal; Notable for the following:     Sodium 129 (*)     Chloride 86 (*)     Glucose, Bld 656 (*)     Creatinine, Ser 0.41 (*)     Total Protein 8.4 (*)  Alkaline Phosphatase 140 (*)     All other components within normal limits  BLOOD GAS, ARTERIAL - Abnormal; Notable for the following:    pH, Arterial 7.499 (*)     pCO2 arterial 27.0 (*)     pO2, Arterial 114.0 (*)     All other components within normal limits  PREGNANCY, URINE  URINALYSIS, ROUTINE W REFLEX MICROSCOPIC   No results found.  EKG:  Rhythm: sinus tachycardia Vent. rate 138 BPM PR interval 136 ms QRS duration 70 ms QT/QTc 284/430 ms Axis: normal ST segments: NS ST changes   1. Abdominal pain   2. Gastroparesis   3. Diabetes type 1, uncontrolled   4. Ketonuria   5. Sinus tachycardia.    MDM  21 year old female with generalized abdominal pain. Noted to be significantly hyperglycemic with blood sugar over 600. She is not acidotic. She has a normal anion gap. She does have ketonuria though. Given the degree of her hyperglycemia, the fact that patient has been rationing her insulin and tachycardia, will admit. Insulin gtt. IVF.        Virgel Manifold, MD 03/15/12 [phone]

## 2012-03-15 NOTE — ED Notes (Signed)
PT reports that blood sugar has been high for her for a couple days but more than 400 all day today; upon arrival to ER blood sugar > 600.

## 2012-03-15 NOTE — H&P (Addendum)
[facility] History and Physical  [name] XBM:[id number] DOB: 06-28-1990 DOA: 03/15/2012  Referring physician: Dr. Doristine Locks PCP: [name], NP  Specialists: none  Chief Complaint: nausea and vomiting  HPI: [name] is a 21 y.o. female  With past medical history of diabetes type 1 with last hemoglobin A1c of 6.1, and gastroparesis of nausea and abdominal pain. She also relates some mild dizziness upon standing. He started getting abdominal pain. Here in ED is dry heaving. She relates no radiation of her pain, nothing makes it better or worst. She also had palpitations during this time, no sweating some nausea, no diarrhea no fever no chills no sick contacts, no cough, no new medications. No vaginal discharge, no burning when she urinates.    Review of Systems: The patient denies anorexia, , weight loss,, vision loss, decreased hearing, hoarseness, chest pain, syncope, dyspnea on exertion, peripheral edema, balance deficits, hemoptysis, , melena, hematochezia, severe indigestion/heartburn, hematuria, incontinence, genital sores, muscle weakness, suspicious skin lesions, transient blindness, difficulty walking, depression, unusual weight change, abnormal bleeding, enlarged lymph nodes, angioedema, and breast masses.    Past Medical History  Diagnosis Date  . Diabetes mellitus   . Hypertension   . Diabetes type 1, uncontrolled 11/14/2011    Since age 15  . Gastroparesis    Past Surgical History  Procedure Date  . Ankle surgery   . Cholecystectomy 11/15/2011    Procedure: LAPAROSCOPIC CHOLECYSTECTOMY WITH INTRAOPERATIVE CHOLANGIOGRAM;  Surgeon: [name], MD;  Location: [facility];  Service: General;  Laterality: N/A;  . Laparoscopy 11/23/2011    Procedure: LAPAROSCOPY DIAGNOSTIC;  Surgeon: Edward Jolly, MD;  Location: [facility];  Service: General;  Laterality: N/A;  . Esophagogastroduodenoscopy 12/03/2011    Procedure: ESOPHAGOGASTRODUODENOSCOPY (EGD);  Surgeon: Beryle Beams, MD;  Location: [facility];  Service: Endoscopy;  Laterality: N/A;   Social History:  reports that she has never smoked. She has never used smokeless tobacco. She reports that she drinks alcohol. She reports that she does not use illicit drugs. Lives at home with partner can perform all her ADLs  Allergies  Allergen Reactions  . Other Anaphylaxis    [place] nuts     Family History  Problem Relation Age of Onset  . Diabetes Mother   . Hypertension Father     Prior to Admission medications   Medication Sig Start Date End Date Taking? Authorizing Provider  acyclovir (ZOVIRAX) 400 MG tablet Take 400 mg by mouth 2 (two) times daily.   Yes Historical Provider, MD  ALPRAZolam (XANAX) 0.25 MG tablet Take 1 tablet (0.25 mg total) by mouth at bedtime as needed for sleep. 02/18/12  Yes Julianne Rice, MD  cyclobenzaprine (FLEXERIL) 10 MG tablet Take 10 mg by mouth at bedtime.   Yes Historical Provider, MD  dicyclomine (BENTYL) 10 MG capsule Take 10 mg by mouth 4 (four) times daily -  before meals and at bedtime.   Yes Historical Provider, MD  enalapril (VASOTEC) 10 MG tablet Take 10 mg by mouth every evening.    Yes Historical Provider, MD  gabapentin (NEURONTIN) 300 MG capsule Take 300 mg by mouth at bedtime.   Yes Historical Provider, MD  insulin aspart (NOVOLOG) 100 UNIT/ML injection Inject 0-15 Units into the skin 3 (three) times daily before meals. 5 u with each meal + sliding scale: CBG 70 - 120: 0 u: CBG 121 - 150: 2 u; CBG 151 - 200: 3 u; CBG 201 - 250: 5 u; CBG 251 -  300: 8 u;CBG 301 - 350: 11 u; CBG 351 - 400: 15 u; CBG > 400 Call MD 09/20/11 09/19/12 Yes [name], MD  insulin glargine (LANTUS) 100 UNIT/ML injection Inject 40 Units into the skin at bedtime. 03/01/12  Yes Charlynne Cousins, MD  metoCLOPramide (REGLAN) 10 MG tablet Take 10 mg by mouth 4 (four) times daily.   Yes Historical Provider, MD  omeprazole (PRILOSEC) 40 MG capsule Take 40 mg by mouth daily.   Yes  Historical Provider, MD  promethazine (PHENERGAN) 25 MG tablet Take 25 mg by mouth every 6 (six) hours as needed. For nausea   Yes Historical Provider, MD   Physical Exam: Filed Vitals:   03/15/12 2030 03/15/12 2100 03/15/12 2130 03/15/12 2145  BP: 136/93 144/87 141/91 134/85  Pulse: 164 123 128 139  Temp:      TempSrc:      Resp: 29 18 22 24   SpO2: 99% 96% 100% 100%    General: Awake alert and oriented x3 ,appears nervous  Eyes: Anicteric pupils equally round and reactive to light  ENT: Dry mucous membranes  Neck: No JVD  Cardiovascular: Regular rate and rhythm with positive S1 and S2 no murmurs rubs gallops  Respiratory: Good air movement and clear to auscultation no tenderness to palpation of the chest  Abdomen: Positive bowel sounds, mild tenderness in the epigastric area no rebound or guarding nondistended and soft  Skin: No rashes ulcerations  Musculoskeletal: Intact  Psychiatric: Appropriate Neurologic: Awake alert and oriented x4 coherent for language and nonfocal  Labs on Admission:  Basic Metabolic Panel:  Lab 16/96/78 2011 03/10/12 1530  NA 129* 129*  K 4.4 4.5  CL 86* 86*  CO2 27 19  GLUCOSE 656* 520*  BUN 10 15  CREATININE 0.41* 0.44*  CALCIUM 10.5 10.6*  MG -- --  PHOS -- --   Liver Function Tests:  Lab 03/15/12 2011 03/10/12 1530  AST 24 22  ALT 24 47*  ALKPHOS 140* 120*  BILITOT 0.4 0.4  PROT 8.4* 8.7*  ALBUMIN 4.4 4.6    Lab 03/10/12 1530  LIPASE 16  AMYLASE --   No results found for this basename: AMMONIA:5 in the last 168 hours CBC:  Lab 03/15/12 2011 03/10/12 1530  WBC 4.5 5.2  NEUTROABS 2.3 3.9  HGB 15.0 14.4  HCT 41.3 39.6  MCV 83.9 85.2  PLT 284 315   Cardiac Enzymes: No results found for this basename: CKTOTAL:5,CKMB:5,CKMBINDEX:5,TROPONINI:5 in the last 168 hours  BNP (last 3 results)  Basename 11/14/11 1605  PROBNP 20.0   CBG:  Lab 03/15/12 1945 03/10/12 1858 03/10/12 1810  GLUCAP >600* 267* 304*     Radiological Exams on Admission: No results found.  EKG: Sinus tachycardia 130 no T wave abnormalities.  Assessment/Plan Hyperglycemia without ketosis: - We'll admit her under observation status, She relates she is not able to afford insulin. So she has been taking just enough not to go into DKA. She said she get her medications on Wednesday. Go ahead and start her on insulin drip, check CBGs q. hourly a B. mets every 4. We'll continue her home dose of Lantus. Will continue IV fluids normal saline. she seems to be intravascularly depleted.  Hyponatremia: - Probably pseudohyponatremia and some component of intravascular the patient. We'll go ahead and start her on normal saline aggressively. We'll give her a bolus of normal saline and then continue on 25, monitor strict I.'s and O.'s.  Gastroparesis/ Epigastric abdominal pain/ Nausea and  vomiting - Probably her gastroparesis is contributing to her abdominal pain. She has  Not taken Reglan at home. She is only tender in the epigastric area no rebound or guarding. Her LFTs are within normal limits except for for her alkaline phosphatase. She relates her abdominal pain is only made worse by food nothing makes it better. I doubt is the gallbladder as her other LFTs are within normal limits. His abdominal pain is probably secondary to her elevated blood glucose and her vomiting. I will ahead and start her on protonix continue Zofran. Resume Reglan. And see if by tomorrow morning she tolerated her diet.  Sinus tachycardia - Mostly 70 to decreased intravascular volume. Once her on IV fluids aggressively monitor strict I.'s and O.'s and continue to check her vitals.   Code Status: full  Family Communication: none  Disposition Plan: home 23 hours   Time spent: [address] Marguarite Arbour [facility] Pager [phone]  If 7PM-7AM, please contact night-coverage [email or web address] Password [email or web address] 03/15/2012, 10:01 PM

## 2012-03-15 NOTE — ED Notes (Signed)
Pt from home with c/o central chest pain, no radiation, hyperglycemia and abdominal pain x 1 day. Pt took reglan at home with no relief for abdominal pain. Patient alert and oriented- CBG on arrival "HI"

## 2012-03-16 DIAGNOSIS — K3184 Gastroparesis: Secondary | ICD-10-CM

## 2012-03-16 DIAGNOSIS — R7309 Other abnormal glucose: Secondary | ICD-10-CM

## 2012-03-16 LAB — COMPREHENSIVE METABOLIC PANEL
ALT: 19 U/L (ref 0–35)
AST: 18 U/L (ref 0–37)
Albumin: 3.9 g/dL (ref 3.5–5.2)
Alkaline Phosphatase: 95 U/L (ref 39–117)
BUN: 9 mg/dL (ref 6–23)
CO2: 27 mEq/L (ref 19–32)
Calcium: 9.9 mg/dL (ref 8.4–10.5)
Chloride: 99 mEq/L (ref 96–112)
Creatinine, Ser: 0.31 mg/dL — ABNORMAL LOW (ref 0.50–1.10)
GFR calc Af Amer: >90 mL/min (ref >90)
GFR calc non Af Amer: >90 mL/min (ref >90)
Glucose, Bld: 120 mg/dL — ABNORMAL HIGH (ref 70–99)
Potassium: 3.6 mEq/L (ref 3.5–5.1)
Sodium: 137 mEq/L (ref 135–145)
Total Bilirubin: 0.5 mg/dL (ref 0.3–1.2)
Total Protein: 7.6 g/dL (ref 6.0–8.3)

## 2012-03-16 LAB — CBC
HCT: 37.4 % (ref 36.0–46.0)
Hemoglobin: 13.4 g/dL (ref 12.0–15.0)
MCH: 29.8 pg (ref 26.0–34.0)
MCHC: 35.8 g/dL (ref 30.0–36.0)
MCV: 83.1 fL (ref 78.0–100.0)
Platelets: 244 10*3/uL (ref 150–400)
RBC: 4.5 MIL/uL (ref 3.87–5.11)
RDW: 12.1 % (ref 11.5–15.5)
WBC: 4.4 10*3/uL (ref 4.0–10.5)

## 2012-03-16 LAB — GLUCOSE, CAPILLARY
Glucose-Capillary: 117 mg/dL — ABNORMAL HIGH (ref 70–99)
Glucose-Capillary: 171 mg/dL — ABNORMAL HIGH (ref 70–99)
Glucose-Capillary: 173 mg/dL — ABNORMAL HIGH (ref 70–99)
Glucose-Capillary: 289 mg/dL — ABNORMAL HIGH (ref 70–99)
Glucose-Capillary: 291 mg/dL — ABNORMAL HIGH (ref 70–99)
Glucose-Capillary: 328 mg/dL — ABNORMAL HIGH (ref 70–99)
Glucose-Capillary: 335 mg/dL — ABNORMAL HIGH (ref 70–99)
Glucose-Capillary: 64 mg/dL — ABNORMAL LOW (ref 70–99)

## 2012-03-16 MED ORDER — GLUCERNA SHAKE PO LIQD: 237.0000 mL | Freq: Three times a day (TID) | ORAL | Status: DC

## 2012-03-16 MED ORDER — GLUCERNA SHAKE PO LIQD
237.0000 mL | Freq: Three times a day (TID) | ORAL | Status: DC
  Filled 2012-03-16 (×2): qty 237

## 2012-03-16 MED ORDER — MORPHINE SULFATE 4 MG/ML IJ SOLN
4.0000 mg | Freq: Once | INTRAMUSCULAR | Status: AC
  Administered 2012-03-16: 4 mg via INTRAVENOUS
  Filled 2012-03-16: qty 1

## 2012-03-16 MED ORDER — INSULIN ASPART 100 UNIT/ML ~~LOC~~ SOLN
8.0000 [IU] | Freq: Once | SUBCUTANEOUS | Status: AC
  Administered 2012-03-16: 8 [IU] via SUBCUTANEOUS

## 2012-03-16 MED ORDER — INSULIN ASPART 100 UNIT/ML ~~LOC~~ SOLN
0.0000 [IU] | Freq: Three times a day (TID) | SUBCUTANEOUS | Status: DC
  Administered 2012-03-16: 8 [IU] via SUBCUTANEOUS

## 2012-03-16 MED ORDER — METOCLOPRAMIDE HCL 10 MG PO TABS: 10.0000 mg | ORAL_TABLET | Freq: Four times a day (QID) | ORAL | Status: DC

## 2012-03-16 NOTE — Discharge Summary (Signed)
Physician Discharge Summary  [name] ZOX:[id number] DOB: 01-Nov-1990 DOA: 03/15/2012  PCP: Alva Garnet., MD GI: [facility]  Admit date: 03/15/2012 Discharge date: 03/16/2012  Time spent: 25 minutes  Recommendations for Outpatient Follow-up:  Please followup with your primary care physician Andi Devon R., MD) in 1 week.  I have called Dr. Mathews Robinsons office, they will likely contact you with an appointment date and time.  If you have not heard in the next couple days please call their office.  Discuss with Dr. Renae Gloss about a referral to endocrinology, given complicated diabetes course and uncontrolled hyperglycemia.  Please followup with your gastroenterologist in 1-2 week.  Discharge Diagnoses:  Principal Problem:  *Hyperglycemia without ketosis Active Problems:  Hyponatremia  Gastroparesis  Sinus tachycardia  Epigastric abdominal pain  Nausea and vomiting  Discharge Condition: Stable  Diet recommendation: Diabetic diet  Filed Weights   03/15/12 2345  Weight: 48.1 kg (106 lb 0.7 oz)    History of present illness:  On admission: "[name] is a 21 y.o. female with past medical history of diabetes type 1 with last hemoglobin A1c of 6.1, and gastroparesis of nausea and abdominal pain. She also relates some mild dizziness upon standing. She started getting abdominal pain. Here in ED is dry heaving."  On 03/15/2012.  Hospital Course:  Hyperglycemia without ketosis  Anion gap closed from 16 to 11, blood sugars improved. Transitioned from insulin drip to Lantus and sliding scale. Had an extensive discussion with the patient about hyperglycemia and compliance with her diabetic regimen. Appreciate input of diabetic coordinator and nutrition. Had an extensive discussion with her insulin regimen and going on 70/30 twice daily to simplify her regimen. Patient at this time prefers being on Lantus and NovoLog. Discussed with Dr. Mathews Robinsons office for  having this patient be referred to endocrinology for management of her complicated diabetes. Discussed with the patient about 3 meals a day regularly, and she cannot eat a meal she can at least eat a fruit drink Glucerna. Nutrition service provided patient with coupons for Glucerna.   Hyponatremia Probably pseudohyponatremia from uncontrolled diabetes. Resolved with IV hydration.   Gastroparesis/ Epigastric abdominal pain/ Nausea and vomiting  Probably her gastroparesis is contributing to her abdominal pain. Patient reports that she has been seeing a gastroenterologist at [facility]. Continue diabetic diet. Improved today and tolerated all of her breakfast.  Patient follow up with her gastroenterologist in the next few weeks if possible.  Sinus tachycardia  Likely due to dehydration from hyperglycemia. Resolved with IV hydration.   Consultants:  None Procedures:  None Antibiotics:  None  Discharge Exam: Filed Vitals:   03/15/12 2345 03/16/12 0000 03/16/12 0001 03/16/12 0528  BP: 117/82 117/82 110/78 123/82  Pulse: 129 134 144 105  Temp: 98.2 F (36.8 C)   97.4 F (36.3 C)  TempSrc: Oral   Oral  Resp: 16   16  Height: 5\' 7"  (1.702 m)     Weight: 48.1 kg (106 lb 0.7 oz)     SpO2:       Discharge Instructions  Discharge Orders    Future Orders Please Complete By Expires   Diet Carb Modified      Increase activity slowly      Discharge instructions      Comments:   Please followup with your primary care physician Andi Devon R., MD) in 1 week.  I have called Dr. Mathews Robinsons office, they will likely contact you with an appointment date and time.  If you have not heard in the next couple days please call their office.  Discuss with Dr. Renae Gloss about a referral to endocrinology, given complicated diabetes course and uncontrolled hyperglycemia.  Please followup with your gastroenterologist in 1-2 week.       Medication List     As of 03/16/2012 11:35 AM     TAKE these medications         acyclovir 400 MG tablet   Commonly known as: ZOVIRAX   Take 400 mg by mouth 2 (two) times daily.      ALPRAZolam 0.25 MG tablet   Commonly known as: XANAX   Take 1 tablet (0.25 mg total) by mouth at bedtime as needed for sleep.      cyclobenzaprine 10 MG tablet   Commonly known as: FLEXERIL   Take 10 mg by mouth at bedtime.      dicyclomine 10 MG capsule   Commonly known as: BENTYL   Take 10 mg by mouth 4 (four) times daily -  before meals and at bedtime.      enalapril 10 MG tablet   Commonly known as: VASOTEC   Take 10 mg by mouth every evening.      feeding supplement Liqd   Take 237 mLs by mouth 3 (three) times daily between meals.      gabapentin 300 MG capsule   Commonly known as: NEURONTIN   Take 300 mg by mouth at bedtime.      insulin aspart 100 UNIT/ML injection   Commonly known as: novoLOG   Inject 0-15 Units into the skin 3 (three) times daily before meals. 5 u with each meal + sliding scale: CBG 70 - 120: 0 u: CBG 121 - 150: 2 u; CBG 151 - 200: 3 u; CBG 201 - 250: 5 u; CBG 251 - 300: 8 u;CBG 301 - 350: 11 u; CBG 351 - 400: 15 u; CBG > 400 Call MD      insulin glargine 100 UNIT/ML injection   Commonly known as: LANTUS   Inject 40 Units into the skin at bedtime.      metoCLOPramide 10 MG tablet   Commonly known as: REGLAN   Take 1 tablet (10 mg total) by mouth 4 (four) times daily.      omeprazole 40 MG capsule   Commonly known as: PRILOSEC   Take 40 mg by mouth daily.      promethazine 25 MG tablet   Commonly known as: PHENERGAN   Take 25 mg by mouth every 6 (six) hours as needed. For nausea           Follow-up Information    Follow up with Alva Garnet., MD. Schedule an appointment as soon as possible for a visit in 1 week. (Office will likely call you with an appointment but if you have not heard from them in the next couple of days please call them.  Discuss with Dr. Renae Gloss about a referral to endocrinology.)     Contact information:   [address] [place] [address] [phone]           The results of significant diagnostics from this hospitalization (including imaging, microbiology, ancillary and laboratory) are listed below for reference.    Significant Diagnostic Studies: Dg Chest 2 View  02/18/2012  *RADIOLOGY REPORT*  Clinical Data: Chest pain and cough.  CHEST - 2 VIEW  Comparison: 12/04/2011 and earlier studies  Findings: Vascular clips in the right upper abdomen. Lungs  clear. Heart size and pulmonary vascularity normal.  No effusion. Visualized bones unremarkable.  IMPRESSION: No acute disease   Original Report Authenticated By: D. Hassell III, MD    Ct Angio Chest Pe W/cm &/or Wo Cm  02/28/2012  *RADIOLOGY REPORT*  Clinical Data: Tachycardia.  Chest pain.  Shortness of breath.  CT ANGIOGRAPHY CHEST  Technique:  Multidetector CT imaging of the chest using the standard protocol during bolus administration of intravenous contrast. Multiplanar reconstructed images including MIPs were obtained and reviewed to evaluate the vascular anatomy.  Contrast: OMNIPAQUE IOHEXOL 350 MG/ML SOLN  Comparison: Chest x-ray dated 02/28/2012 and CT angiograms dated 12/04/2011 and 11/07/2011  Findings: There are no pulmonary emboli, infiltrates, effusions, mass lesions, or adenopathy.  Osseous structures are normal. Heart size is normal.  Vascularity is normal.  IMPRESSION: Normal exam.   Original Report Authenticated By: Francene Boyers, M.D.    Dg Chest Portable 1 View  02/28/2012  *RADIOLOGY REPORT*  Clinical Data: Chest pain.  Shortness of breath.  PORTABLE CHEST - 1 VIEW  Comparison: Two-view chest 02/18/2012.  Findings: The heart size is normal.  The lungs are clear.  The visualized soft tissues and bony thorax are unremarkable.  IMPRESSION: Negative chest.   Original Report Authenticated By: Marin Roberts, M.D.    Dg Abd Acute W/chest  03/10/2012  *RADIOLOGY REPORT*   Clinical Data: Abdominal pain, nausea, weakness  ACUTE ABDOMEN SERIES (ABDOMEN 2 VIEW & CHEST 1 VIEW)  Comparison: Chest radiograph 02/28/2012, abdominal radiographs 11/22/2011  Findings: Normal heart size, mediastinal contours, and pulmonary vascularity. Mildly hyperinflated lungs with bronchitic changes. Lungs clear. No pleural effusion or pneumothorax. Numerous cardiac monitoring leads project over chest and abdomen. Surgical clips right upper quadrant question cholecystectomy. Nonobstructive bowel gas pattern. No bowel dilatation, bowel wall thickening, or free intraperitoneal air. Increased attenuation of the abdomen with medial displacement of bowel loops from the flanks on supine view suggesting presence of ascites. No acute osseous findings or definite urinary tract calcification.  IMPRESSION: Hyperinflated lungs with mild chronic bronchitic changes. Nonobstructive bowel gas pattern. Question ascites.   Original Report Authenticated By: Ulyses Southward, M.D.     Microbiology: No results found for this or any previous visit (from the past 240 hour(s)).   Labs: Basic Metabolic Panel:  Lab 03/16/12 1610 03/15/12 2011 03/10/12 1530  NA 137 129* 129*  K 3.6 4.4 4.5  CL 99 86* 86*  CO2 27 27 19   GLUCOSE 120* 656* 520*  BUN 9 10 15   CREATININE 0.31* 0.41* 0.44*  CALCIUM 9.9 10.5 10.6*  MG -- -- --  PHOS -- -- --   Liver Function Tests:  Lab 03/16/12 0154 03/15/12 2011 03/10/12 1530  AST 18 24 22   ALT 19 24 47*  ALKPHOS 95 140* 120*  BILITOT 0.5 0.4 0.4  PROT 7.6 8.4* 8.7*  ALBUMIN 3.9 4.4 4.6    Lab 03/10/12 1530  LIPASE 16  AMYLASE --   No results found for this basename: AMMONIA:5 in the last 168 hours CBC:  Lab 03/16/12 0154 03/15/12 2011 03/10/12 1530  WBC 4.4 4.5 5.2  NEUTROABS -- 2.3 3.9  HGB 13.4 15.0 14.4  HCT 37.4 41.3 39.6  MCV 83.1 83.9 85.2  PLT 244 284 315   Cardiac Enzymes: No results found for this basename: CKTOTAL:5,CKMB:5,CKMBINDEX:5,TROPONINI:5 in the  last 168 hours BNP: BNP (last 3 results)  Basename 11/14/11 1605  PROBNP 20.0   CBG:  Lab 03/16/12 1118 03/16/12 0743 03/16/12 0648 03/16/12 0542 03/16/12  0326  GLUCAP 64* 289* 328* 335* 291*     Signed:  Carynn Felling A  [facility] 03/16/2012, 11:35 AM

## 2012-03-16 NOTE — Progress Notes (Signed)
INITIAL NUTRITION ASSESSMENT  Pt meets criteria for severe MALNUTRITION in the context of chronic illness as evidenced by 11.7% weight loss in the past 3 months with <75% estimated energy intake in the past few months.  DOCUMENTATION CODES Per approved criteria  -Severe malnutrition in the context of chronic illness -Underweight   INTERVENTION: - Outpatient diabetic management referral for follow up diabetic education - Glucerna shakes TID - Educated pt on high calorie/protein diet for weight gain - Recommend MD consider psych evaluation for possible depression r/t frequent admissions  - Will continue to monitor   NUTRITION DIAGNOSIS: Altered nutrition-related laboratory values related to uncontrolled type 1 diabetes as evidenced by hemoglobin A1c of 10.8% in November 2013.   Goal: 1. CBGs WNL 2. Pt to consume >90% of meals/supplements  Monitor:  CBGs, weights, labs, intake   Reason for Assessment: Nutrition risk   21 y.o. female  Admitting Dx: Hyperglycemia without ketosis  ASSESSMENT: Pt known to RD from previous admissions. Pt being followed by inpatient diabetic coordinator. Pt is a type 1 diabetic with poor blood sugar control with most recent hemoglobin A1c of 10.8% last month. Pt reports eating well PTA, 2-3 small meals/day with good appetite. Pt reports following a bland diet for her gastroparesis. Pt reports checking her blood sugar 3-4 times/day however it has been elevated recently r/t running out of insulin.   Height: Ht Readings from Last 1 Encounters:  03/15/12 5\' 7"  (1.702 m)    Weight: Wt Readings from Last 1 Encounters:  03/15/12 106 lb 0.7 oz (48.1 kg)    Ideal Body Weight: 135 lb  % Ideal Body Weight: 78  Wt Readings from Last 10 Encounters:  03/15/12 106 lb 0.7 oz (48.1 kg)  02/29/12 111 lb 8.8 oz (50.6 kg)  02/18/12 100 lb (45.36 kg)  12/13/11 115 lb (52.164 kg)  12/01/11 120 lb 3.2 oz (54.522 kg)  12/01/11 120 lb 3.2 oz (54.522 kg)   12/01/11 120 lb 3.2 oz (54.522 kg)  11/14/11 115 lb (52.164 kg)  11/14/11 115 lb (52.164 kg)  11/02/11 125 lb (56.7 kg)   Usual Body Weight: 120 lb  % Usual Body Weight: 88  BMI:  Body mass index is 16.61 kg/(m^2).  Estimated Nutritional Needs: Kcal: 1700-1950 Protein: 75-85g Fluid: 1.7-1.9L   Diet Order: Carb Control  EDUCATION NEEDS: -Education needs addressed - used teach back method to educate pt on high calorie/protein diet and provided handout of this information.    Intake/Output Summary (Last 24 hours) at 03/16/12 1039 Last data filed at 03/16/12 0852  Gross per 24 hour  Intake 1262.92 ml  Output      0 ml  Net 1262.92 ml    Last BM: 12/16  Labs:   Lab 03/16/12 0154 03/15/12 2011 03/10/12 1530  NA 137 129* 129*  K 3.6 4.4 4.5  CL 99 86* 86*  CO2 27 27 19   BUN 9 10 15   CREATININE 0.31* 0.41* 0.44*  CALCIUM 9.9 10.5 10.6*  MG -- -- --  PHOS -- -- --  GLUCOSE 120* 656* 520*   Lab Results  Component Value Date   HGBA1C 10.8* 02/28/2012    CBG (last 3)   Basename 03/16/12 0743 03/16/12 0648 03/16/12 0542  GLUCAP 289* 328* 335*    Scheduled Meds:   . acyclovir  400 mg Oral BID  . cyclobenzaprine  10 mg Oral QHS  . dicyclomine  10 mg Oral TID AC & HS  . enalapril  10 mg  Oral QPM  . gabapentin  300 mg Oral QHS  . heparin  5,000 Units Subcutaneous Q8H  . insulin aspart  0-15 Units Subcutaneous TID WC  . insulin glargine  40 Units Subcutaneous QHS  . metoCLOPramide  10 mg Oral QID  . pantoprazole  40 mg Oral Daily    Continuous Infusions:   . sodium chloride 125 mL/hr at 03/16/12 0009    Past Medical History  Diagnosis Date  . Diabetes mellitus   . Hypertension   . Diabetes type 1, uncontrolled 11/14/2011    Since age 33  . Gastroparesis     Past Surgical History  Procedure Date  . Ankle surgery   . Cholecystectomy 11/15/2011    Procedure: LAPAROSCOPIC CHOLECYSTECTOMY WITH INTRAOPERATIVE CHOLANGIOGRAM;  Surgeon: [name],  MD;  Location: [facility];  Service: General;  Laterality: N/A;  . Laparoscopy 11/23/2011    Procedure: LAPAROSCOPY DIAGNOSTIC;  Surgeon: Mariella Saa, MD;  Location: [facility];  Service: General;  Laterality: N/A;  . Esophagogastroduodenoscopy 12/03/2011    Procedure: ESOPHAGOGASTRODUODENOSCOPY (EGD);  Surgeon: Theda Belfast, MD;  Location: [facility];  Service: Endoscopy;  Laterality: N/A;      Levon Hedger MS, RD, LDN [phone] Pager [phone] After Hours Pager

## 2012-03-16 NOTE — Progress Notes (Signed)
Inpatient Diabetes Program Recommendations  AACE/ADA: New Consensus Statement on Inpatient Glycemic Control (2013)  Target Ranges:  Prepandial:   less than 140 mg/dL      Peak postprandial:   less than 180 mg/dL (1-2 hours)      Critically ill patients:  140 - 180 mg/dL   Reason for Visit: DKA / Hyperglycemia  Multiple admissions for this 21 year old BF with Type 1 DM.  Pt has had DM since age 55 and states she doesn't check her blood sugars regularly at home because of lack of strips.  States she hasn't had the money to pay for strips and Novolog, so she has been "trying to make the Novolog stretch."  States she has plenty of Lantus because her mother had taken it in the past and she has a supply of it for a couple of months.  Pt said she skips breakfast, eats some sort of pasta at lunch and then eats at night when she comes home from work.  When asked about going on less expensive insulin, she said she wanted to stick with Novolog and Lantus.  Did not want to go on 70/30 insulin.  Stressed importance of checking blood sugars and following insulin orders from MD.  States she was referred to Dr. Kellie Shropshire for PCP and appt was in Jan.  Recommended she make earlier appt. Has hypoglycemia at home occasionally when she doesn't eat after taking insulin.  Results for [name], [name] (MRN [id number]) as of 03/16/2012 09:50  Ref. Range 03/16/2012 02:26 03/16/2012 03:26 03/16/2012 05:42 03/16/2012 06:48 03/16/2012 07:43  Glucose-Capillary Latest Range: 70-99 mg/dL 086 (H) 578 (H) 469 (H) 328 (H) 289 (H)  Results for [name], [name] (MRN [id number]) as of 03/16/2012 09:50  Ref. Range 03/16/2012 01:54  Sodium Latest Range: 135-145 mEq/L 137  Potassium Latest Range: 3.5-5.1 mEq/L 3.6  Chloride Latest Range: 96-112 mEq/L 99  CO2 Latest Range: 19-32 mEq/L 27  BUN Latest Range: 6-23 mg/dL 9  Creatinine Latest Range: 0.50-1.10 mg/dL 2.44 (L)  Calcium Latest Range: 8.4-10.5 mg/dL 9.9  GFR calc non Af Amer  Latest Range: >90 mL/min >90  GFR calc Af Amer Latest Range: >90 mL/min >90  Glucose Latest Range: 70-99 mg/dL 010 (H)  Alkaline Phosphatase Latest Range: 39-117 U/L 95  Albumin Latest Range: 3.5-5.2 g/dL 3.9  AST Latest Range: 0-37 U/L 18  ALT Latest Range: 0-35 U/L 19  Total Protein Latest Range: 6.0-8.3 g/dL 7.6  Total Bilirubin Latest Range: 0.3-1.2 mg/dL 0.5   Results for [name], [name] (MRN [id number]) as of 03/16/2012 09:50  Ref. Range 02/28/2012 17:40  Hemoglobin A1C Latest Range: <5.7 % 10.8 (H)   DKA resolved and pt received basal insulin prior to discontinuation of insulin drip.  Encouraged pt to check blood sugars at home 3 - 4 times/day, take insulin (both long-acting and rapid-acting) as ordered and eat CHOmod meals at regular times.  Discussed with Dr. Betti Cruz, RN and RD.  May benefit from psych consult for depression??  Recommendations:  Decrease Lantus to 30 units QHS. Add meal coverage insulin - Novolog 4 units tidwc, if pt eats >50% meals. Decrease Novolog correction to sensitive tidwc and hs.  Will continue to follow.

## 2012-03-16 NOTE — Progress Notes (Signed)
[facility] PROGRESS NOTE  [name] ZOX:[id number] DOB: 1990-09-15 DOA: 03/15/2012 PCP: Kathryn Garnet., MD  Assessment/Plan: Hyperglycemia without ketosis Anion gap closed from 16 to 11, blood sugars improved.  Transitioned from insulin drip to Lantus and sliding scale.  Had an extensive discussion with the patient about hyperglycemia and compliance with her diabetic regimen.  Appreciate input of diabetic coordinator and nutrition.  Had an extensive discussion with her insulin regimen and going on 70/30 twice daily to simplify her regimen.  Patient at this time prefers being on Lantus and NovoLog.  Discussed with Dr. Mathews Robinsons office for having this patient be referred to endocrinology for management of her complicated diabetes.  Discussed with the patient about 3 meals a day regularly, and she cannot eat a meal she can at least eat a fruit drink Glucerna.  Nutrition service provided patient with coupons for Glucerna.  Hyponatremia:  Probably pseudohyponatremia from uncontrolled diabetes.  Resolved with IV hydration.    Gastroparesis/ Epigastric abdominal pain/ Nausea and vomiting  Probably her gastroparesis is contributing to her abdominal pain.  Patient reports that she has been seeing a gastroenterologist at [facility].  Continue diabetic diet.  Improved today and tolerated all of her breakfast.    Sinus tachycardia  Likely due to dehydration from hyperglycemia.  Resolved with IV hydration.  Code Status: Full code. Family Communication: Significant other by bedside. Disposition Plan: Discharge the patient home.  Consultants:  None  Procedures:  None  Antibiotics:  None  HPI/Subjective: No specific complaints.  Feeling better.  Wondering if she can go home today.  Objective: Filed Vitals:   03/15/12 2345 03/16/12 0000 03/16/12 0001 03/16/12 0528  BP: 117/82 117/82 110/78 123/82  Pulse: 129 134 144 105  Temp: 98.2 F (36.8 C)   97.4 F (36.3  C)  TempSrc: Oral   Oral  Resp: 16   16  Height: 5\' 7"  (1.702 m)     Weight: 48.1 kg (106 lb 0.7 oz)     SpO2:        Intake/Output Summary (Last 24 hours) at 03/16/12 1105 Last data filed at 03/16/12 0852  Gross per 24 hour  Intake 1262.92 ml  Output      0 ml  Net 1262.92 ml   Filed Weights   03/15/12 2345  Weight: 48.1 kg (106 lb 0.7 oz)    Exam: Physical Exam: General: Awake, Oriented, No acute distress. HEENT: EOMI. Neck: Supple CV: S1 and S2 Lungs: Clear to ascultation bilaterally Abdomen: Soft, Nontender, Nondistended, +bowel sounds. Ext: Good pulses. Trace edema.  Data Reviewed: Basic Metabolic Panel:  Lab 03/16/12 8119 03/15/12 2011 03/10/12 1530  NA 137 129* 129*  K 3.6 4.4 4.5  CL 99 86* 86*  CO2 27 27 19   GLUCOSE 120* 656* 520*  BUN 9 10 15   CREATININE 0.31* 0.41* 0.44*  CALCIUM 9.9 10.5 10.6*  MG -- -- --  PHOS -- -- --   Liver Function Tests:  Lab 03/16/12 0154 03/15/12 2011 03/10/12 1530  AST 18 24 22   ALT 19 24 47*  ALKPHOS 95 140* 120*  BILITOT 0.5 0.4 0.4  PROT 7.6 8.4* 8.7*  ALBUMIN 3.9 4.4 4.6    Lab 03/10/12 1530  LIPASE 16  AMYLASE --   No results found for this basename: AMMONIA:5 in the last 168 hours CBC:  Lab 03/16/12 0154 03/15/12 2011 03/10/12 1530  WBC 4.4 4.5 5.2  NEUTROABS -- 2.3 3.9  HGB 13.4 15.0 14.4  HCT  37.4 41.3 39.6  MCV 83.1 83.9 85.2  PLT 244 284 315   Cardiac Enzymes: No results found for this basename: CKTOTAL:5,CKMB:5,CKMBINDEX:5,TROPONINI:5 in the last 168 hours BNP (last 3 results)  Basename 11/14/11 1605  PROBNP 20.0   CBG:  Lab 03/16/12 0743 03/16/12 0648 03/16/12 0542 03/16/12 0326 03/16/12 0226  GLUCAP 289* 328* 335* 291* 171*    No results found for this or any previous visit (from the past 240 hour(s)).   Studies: No results found.  Scheduled Meds:   . acyclovir  400 mg Oral BID  . cyclobenzaprine  10 mg Oral QHS  . dicyclomine  10 mg Oral TID AC & HS  . enalapril  10 mg  Oral QPM  . feeding supplement  237 mL Oral TID BM  . gabapentin  300 mg Oral QHS  . heparin  5,000 Units Subcutaneous Q8H  . insulin aspart  0-15 Units Subcutaneous TID WC  . insulin glargine  40 Units Subcutaneous QHS  . metoCLOPramide  10 mg Oral QID  . pantoprazole  40 mg Oral Daily   Continuous Infusions:   . sodium chloride 125 mL/hr at 03/16/12 0009    Principal Problem:  *Hyperglycemia without ketosis Active Problems:  Hyponatremia  Gastroparesis  Sinus tachycardia  Epigastric abdominal pain  Nausea and vomiting    Time spent: 35 mins    [name]  [facility] Pager [phone]. If 7PM-7AM, please contact night-coverage at [email or web address], password [facility] 03/16/2012, 11:05 AM  LOS: 1 day

## 2012-03-16 NOTE — Progress Notes (Signed)
Patient discharged home with husband, alert and oriented discharge instruction given,patient verbalize understanding of instructions given, could not log on to [employer] page was unavailable at time of discharge, patient in stable condition at this time

## 2012-04-15 ENCOUNTER — Ambulatory Visit (INDEPENDENT_AMBULATORY_CARE_PROVIDER_SITE_OTHER): Payer: 59 | Admitting: Family Medicine

## 2012-04-15 VITALS — BP 100/70 | HR 135 | Temp 98.6°F | Resp 19 | Ht 68.5 in | Wt 106.0 lb

## 2012-04-15 DIAGNOSIS — IMO0001 Reserved for inherently not codable concepts without codable children: Secondary | ICD-10-CM

## 2012-04-15 DIAGNOSIS — M25561 Pain in right knee: Secondary | ICD-10-CM

## 2012-04-15 DIAGNOSIS — F341 Dysthymic disorder: Secondary | ICD-10-CM

## 2012-04-15 DIAGNOSIS — E119 Type 2 diabetes mellitus without complications: Secondary | ICD-10-CM

## 2012-04-15 DIAGNOSIS — M791 Myalgia, unspecified site: Secondary | ICD-10-CM

## 2012-04-15 DIAGNOSIS — F418 Other specified anxiety disorders: Secondary | ICD-10-CM

## 2012-04-15 DIAGNOSIS — E860 Dehydration: Secondary | ICD-10-CM

## 2012-04-15 DIAGNOSIS — M25569 Pain in unspecified knee: Secondary | ICD-10-CM

## 2012-04-15 DIAGNOSIS — R739 Hyperglycemia, unspecified: Secondary | ICD-10-CM

## 2012-04-15 LAB — POCT URINALYSIS DIPSTICK
Bilirubin, UA: NEGATIVE
Blood, UA: NEGATIVE
Glucose, UA: 500
Ketones, UA: 160
Leukocytes, UA: NEGATIVE
Nitrite, UA: NEGATIVE
Protein, UA: NEGATIVE
Spec Grav, UA: 1.01
Urobilinogen, UA: 0.2
pH, UA: 5.5

## 2012-04-15 LAB — POCT UA - MICROSCOPIC ONLY
Casts, Ur, LPF, POC: NEGATIVE
Crystals, Ur, HPF, POC: NEGATIVE
Epithelial cells, urine per micros: NEGATIVE
Mucus, UA: NEGATIVE
Yeast, UA: NEGATIVE

## 2012-04-15 LAB — GLUCOSE, POCT (MANUAL RESULT ENTRY)
POC Glucose: 374 mg/dl — AB (ref 70–99)
POC Glucose: 222 mg/dl — AB (ref 70–99)

## 2012-04-15 LAB — POCT SEDIMENTATION RATE: POCT SED RATE: 36 mm/hr — AB (ref 0–22)

## 2012-04-15 LAB — POCT URINE PREGNANCY: Preg Test, Ur: NEGATIVE

## 2012-04-15 MED ORDER — NON FORMULARY
20.0000 [IU] | Freq: Once | Status: AC
  Administered 2012-04-15: 20 [IU] via SUBCUTANEOUS

## 2012-04-15 MED ORDER — CITALOPRAM HYDROBROMIDE 20 MG PO TABS: ORAL_TABLET | ORAL | Status: DC

## 2012-04-15 MED ORDER — CLONAZEPAM 0.5 MG PO TABS: 0.5000 mg | ORAL_TABLET | Freq: Two times a day (BID) | ORAL | Status: DC | PRN

## 2012-04-15 MED ORDER — INSULIN ASPART 100 UNIT/ML ~~LOC~~ SOLN
10.0000 [IU] | Freq: Once | SUBCUTANEOUS | Status: AC
  Administered 2012-04-15: 10 [IU] via SUBCUTANEOUS

## 2012-04-15 NOTE — Progress Notes (Signed)
Subjective:    Patient ID: [name], female    DOB: 1990-11-13, 22 y.o.   MRN: [id number]  HPI  [name] is a 22 yo type I DM who has an extensive PMHx resulting in multiple ER visits each mo.  She reports she first fell on her knees last yr and has had some occ knee pain since but never this bad. Had eval by [facility] orthopedics but never finished PT as couldn't afford it.  This morning her bilateral knee pain woke her from sleep, she couldn't go back to sleep, couldn't get into any comfortable position.  Knees have never hurt this bad.  Took 220 naprosyn w/o relief.  No further falls or injuries.  Have stiffness, pain is getting better as she moves around a little today.  Has not taken any insulin for the past 2d as she was to busy - just got distracted and forgot, last ate last night.   Supposed to be on 40u lantus qhs and sliding scale tid novolog - usually using novolog 1-2x/d.  Has missed lantus for several days now - just fell out of the routine so forgets. Pt suspects her sugar is very high but hasn't checked it in several days. She thinks her high sugars might be tied into her pain.  Past Medical History  Diagnosis Date  . Diabetes mellitus   . Hypertension   . Diabetes type 1, uncontrolled 11/14/2011    Since age 57  . Gastroparesis    Past Surgical History  Procedure Date  . Ankle surgery   . Cholecystectomy 11/15/2011    Procedure: LAPAROSCOPIC CHOLECYSTECTOMY WITH INTRAOPERATIVE CHOLANGIOGRAM;  Surgeon: [name], MD;  Location: [facility];  Service: General;  Laterality: N/A;  . Laparoscopy 11/23/2011    Procedure: LAPAROSCOPY DIAGNOSTIC;  Surgeon: Edward Jolly, MD;  Location: [facility];  Service: General;  Laterality: N/A;  . Esophagogastroduodenoscopy 12/03/2011    Procedure: ESOPHAGOGASTRODUODENOSCOPY (EGD);  Surgeon: Beryle Beams, MD;  Location: [facility];  Service: Endoscopy;  Laterality: N/A;   Current Outpatient Prescriptions on File Prior to Visit    Medication Sig Dispense Refill  . acyclovir (ZOVIRAX) 400 MG tablet Take 400 mg by mouth 2 (two) times daily.      Marland Kitchen ALPRAZolam (XANAX) 0.25 MG tablet Take 1 tablet (0.25 mg total) by mouth at bedtime as needed for sleep.  30 tablet  0  . cyclobenzaprine (FLEXERIL) 10 MG tablet Take 10 mg by mouth at bedtime.      . dicyclomine (BENTYL) 10 MG capsule Take 10 mg by mouth 4 (four) times daily -  before meals and at bedtime.      . enalapril (VASOTEC) 10 MG tablet Take 10 mg by mouth every evening.       . feeding supplement (GLUCERNA SHAKE) LIQD Take 237 mLs by mouth 3 (three) times daily between meals.      . gabapentin (NEURONTIN) 300 MG capsule Take 300 mg by mouth at bedtime.      . insulin aspart (NOVOLOG) 100 UNIT/ML injection Inject 0-15 Units into the skin 3 (three) times daily before meals. 5 u with each meal + sliding scale: CBG 70 - 120: 0 u: CBG 121 - 150: 2 u; CBG 151 - 200: 3 u; CBG 201 - 250: 5 u; CBG 251 - 300: 8 u;CBG 301 - 350: 11 u; CBG 351 - 400: 15 u; CBG > 400 Call MD      . insulin glargine (LANTUS) 100  UNIT/ML injection Inject 40 Units into the skin at bedtime.  10 mL  12  . metoCLOPramide (REGLAN) 10 MG tablet Take 1 tablet (10 mg total) by mouth 4 (four) times daily.  120 tablet  0  . omeprazole (PRILOSEC) 40 MG capsule Take 40 mg by mouth daily.      . promethazine (PHENERGAN) 25 MG tablet Take 25 mg by mouth every 6 (six) hours as needed. For nausea       Allergies  Allergen Reactions  . Other Anaphylaxis    [place] nuts     Review of Systems  Constitutional: Positive for activity change, appetite change and fatigue. Negative for fever, chills and unexpected weight change.  Musculoskeletal: Positive for myalgias, arthralgias and gait problem. Negative for back pain and joint swelling.  Skin: Negative for color change and rash.  Neurological: Positive for weakness. Negative for syncope and numbness.      BP 100/70  Pulse 135  Temp 98.6 F (37 C) (Oral)  Resp  19  Ht 5' 8.5" (1.74 m)  Wt 106 lb (48.081 kg)  BMI 15.88 kg/m2 Objective:   Physical Exam  Constitutional: She is oriented to person, place, and time. She appears well-developed and well-nourished. She appears cachectic. She appears distressed.       Pt tearful upon entering exam room, worsened during exam  HENT:  Head: Normocephalic and atraumatic.  Right Ear: External ear normal.  Eyes: Conjunctivae normal are normal. No scleral icterus.  Pulmonary/Chest: Effort normal.  Musculoskeletal:       Right knee: She exhibits bony tenderness. She exhibits normal range of motion, no swelling, no effusion, no ecchymosis, no deformity, no erythema, no LCL laxity, normal patellar mobility, normal meniscus and no MCL laxity. tenderness found. Medial joint line, lateral joint line, MCL, LCL and patellar tendon tenderness noted.       Left knee: She exhibits bony tenderness. She exhibits normal range of motion, no swelling, no effusion, no ecchymosis, no deformity, no erythema, no LCL laxity, normal patellar mobility, normal meniscus and no MCL laxity. tenderness found. Medial joint line, lateral joint line, MCL, LCL and patellar tendon tenderness noted.       Most pain with palpation of central patella  Neurological: She is alert and oriented to person, place, and time.  Skin: Skin is warm and dry. She is not diaphoretic. No erythema.  Psychiatric: Her speech is normal and behavior is normal. Her mood appears anxious. She exhibits a depressed mood.      Results for orders placed in visit on 04/15/12  POCT SEDIMENTATION RATE      Component Value Range   POCT SED RATE 36 (*) 0 - 22 mm/hr  GLUCOSE, POCT (MANUAL RESULT ENTRY)      Component Value Range   POC Glucose HHH  70 - 99 mg/dl  POCT UA - MICROSCOPIC ONLY      Component Value Range   WBC, Ur, HPF, POC 2-5     RBC, urine, microscopic 0-1     Bacteria, U Microscopic trace     Mucus, UA neg     Epithelial cells, urine per micros neg      Crystals, Ur, HPF, POC neg     Casts, Ur, LPF, POC neg     Yeast, UA neg    POCT URINALYSIS DIPSTICK      Component Value Range   Color, UA yellow     Clarity, UA clear     Glucose, UA 500  Bilirubin, UA neg     Ketones, UA 160     Spec Grav, UA 1.010     Blood, UA neg     pH, UA 5.5     Protein, UA neg     Urobilinogen, UA 0.2     Nitrite, UA neg     Leukocytes, UA Negative    POCT URINE PREGNANCY      Component Value Range   Preg Test, Ur Negative      Assessment & Plan:  Hyperglycemia - IV placed with 3L NS given. 20u Novolog given as cbg unreadable (machine reads up to 400). 1 1/2 hrs later cbg down to 333 so pt given Novolog 10u which brought cbg to 222 after another hr.  Pt then states she was feeling much better.  Abdominal exam nml and even knee pain decreased.  Pt reports she has an endocrinologist appt to establish with at [facility] later this mo (prev endocrinologist retired and pt does not have a PCP.) Has plenty of insulin at home - just needs to take it and she will try.   Pt reports during her last hospitalization she was started on medications for anxiety but she has since ran out of them. Pt freq tearful during time - will start citalopram 84m 1/2 tab po qd x 2 wks then 1 tab po qd and gave klonopoin 0.556mbid prn disp 20, ref 1. Pt likely needs to see psychiatry.  Knee pain - Exam nml - perhaps tied to sudden weightloss from uncontrolled DM?  F/u as needed.  Pt was in office for 6 hrs. She stated she felt back to normal and her ride was here so she had to leave - pt cautioned to watch cbgs closely this evening and restart home lantus - pt agrees. RTC for any further needs.

## 2012-04-16 ENCOUNTER — Emergency Department (HOSPITAL_COMMUNITY)
Admission: EM | Admit: 2012-04-16 | Discharge: 2012-04-16 | Disposition: A | Discharge status: Home / Self Care | Payer: 59 | Attending: Emergency Medicine | Admitting: Emergency Medicine

## 2012-04-16 ENCOUNTER — Encounter (HOSPITAL_COMMUNITY): Payer: Self-pay | Admitting: Family Medicine

## 2012-04-16 DIAGNOSIS — R000 Tachycardia, unspecified: Secondary | ICD-10-CM | POA: Insufficient documentation

## 2012-04-16 DIAGNOSIS — Z794 Long term (current) use of insulin: Secondary | ICD-10-CM | POA: Insufficient documentation

## 2012-04-16 DIAGNOSIS — R1013 Epigastric pain: Secondary | ICD-10-CM

## 2012-04-16 DIAGNOSIS — Z79899 Other long term (current) drug therapy: Secondary | ICD-10-CM | POA: Insufficient documentation

## 2012-04-16 DIAGNOSIS — E1049 Type 1 diabetes mellitus with other diabetic neurological complication: Secondary | ICD-10-CM | POA: Insufficient documentation

## 2012-04-16 DIAGNOSIS — I10 Essential (primary) hypertension: Secondary | ICD-10-CM | POA: Insufficient documentation

## 2012-04-16 DIAGNOSIS — K3184 Gastroparesis: Secondary | ICD-10-CM

## 2012-04-16 DIAGNOSIS — R630 Anorexia: Secondary | ICD-10-CM | POA: Insufficient documentation

## 2012-04-16 DIAGNOSIS — Z3202 Encounter for pregnancy test, result negative: Secondary | ICD-10-CM | POA: Insufficient documentation

## 2012-04-16 DIAGNOSIS — R110 Nausea: Secondary | ICD-10-CM | POA: Insufficient documentation

## 2012-04-16 DIAGNOSIS — E118 Type 2 diabetes mellitus with unspecified complications: Secondary | ICD-10-CM

## 2012-04-16 DIAGNOSIS — E1065 Type 1 diabetes mellitus with hyperglycemia: Secondary | ICD-10-CM | POA: Insufficient documentation

## 2012-04-16 LAB — BASIC METABOLIC PANEL
BUN: 8 mg/dL (ref 6–23)
CO2: 20 mEq/L (ref 19–32)
Calcium: 9.9 mg/dL (ref 8.4–10.5)
Chloride: 90 mEq/L — ABNORMAL LOW (ref 96–112)
Creat: 0.68 mg/dL (ref 0.50–1.10)
Glucose, Bld: 511 mg/dL (ref 70–99)
Potassium: 4.6 mEq/L (ref 3.5–5.3)
Sodium: 131 mEq/L — ABNORMAL LOW (ref 135–145)

## 2012-04-16 LAB — URINALYSIS, ROUTINE W REFLEX MICROSCOPIC
Bilirubin Urine: NEGATIVE
Glucose, UA: NEGATIVE mg/dL
Hgb urine dipstick: NEGATIVE
Ketones, ur: NEGATIVE mg/dL
Leukocytes, UA: NEGATIVE
Nitrite: NEGATIVE
Protein, ur: NEGATIVE mg/dL
Specific Gravity, Urine: 1.02 (ref 1.005–1.030)
Urobilinogen, UA: 0.2 mg/dL (ref 0.0–1.0)
pH: 5 (ref 5.0–8.0)

## 2012-04-16 LAB — COMPREHENSIVE METABOLIC PANEL
ALT: 52 U/L — ABNORMAL HIGH (ref 0–35)
AST: 29 U/L (ref 0–37)
Albumin: 3.3 g/dL — ABNORMAL LOW (ref 3.5–5.2)
Alkaline Phosphatase: 116 U/L (ref 39–117)
BUN: 8 mg/dL (ref 6–23)
CO2: 28 mEq/L (ref 19–32)
Calcium: 9.4 mg/dL (ref 8.4–10.5)
Chloride: 99 mEq/L (ref 96–112)
Creatinine, Ser: 0.3 mg/dL — ABNORMAL LOW (ref 0.50–1.10)
GFR calc Af Amer: >90 mL/min (ref >90)
GFR calc non Af Amer: >90 mL/min (ref >90)
Glucose, Bld: 233 mg/dL — ABNORMAL HIGH (ref 70–99)
Potassium: 3.6 mEq/L (ref 3.5–5.1)
Sodium: 137 mEq/L (ref 135–145)
Total Bilirubin: 0.3 mg/dL (ref 0.3–1.2)
Total Protein: 6.9 g/dL (ref 6.0–8.3)

## 2012-04-16 LAB — CBC
HCT: 36 % (ref 36.0–46.0)
Hemoglobin: 13 g/dL (ref 12.0–15.0)
MCH: 30.4 pg (ref 26.0–34.0)
MCHC: 36.1 g/dL — ABNORMAL HIGH (ref 30.0–36.0)
MCV: 84.3 fL (ref 78.0–100.0)
Platelets: 252 10*3/uL (ref 150–400)
RBC: 4.27 MIL/uL (ref 3.87–5.11)
RDW: 11.5 % (ref 11.5–15.5)
WBC: 6.7 10*3/uL (ref 4.0–10.5)

## 2012-04-16 LAB — CK: Total CK: 31 U/L (ref 7–177)

## 2012-04-16 LAB — GLUCOSE, CAPILLARY: Glucose-Capillary: 254 mg/dL — ABNORMAL HIGH (ref 70–99)

## 2012-04-16 LAB — C-REACTIVE PROTEIN: CRP: <0.5 mg/dL (ref <0.60)

## 2012-04-16 LAB — PREGNANCY, URINE: Preg Test, Ur: NEGATIVE

## 2012-04-16 MED ORDER — SODIUM CHLORIDE 0.9 % IV BOLUS (SEPSIS)
1000.0000 mL | Freq: Once | INTRAVENOUS | Status: AC
  Administered 2012-04-16: 1000 mL via INTRAVENOUS

## 2012-04-16 MED ORDER — METOCLOPRAMIDE HCL 5 MG/ML IJ SOLN
10.0000 mg | Freq: Once | INTRAMUSCULAR | Status: AC
  Administered 2012-04-16: 10 mg via INTRAVENOUS
  Filled 2012-04-16: qty 2

## 2012-04-16 MED ORDER — ONDANSETRON 4 MG PO TBDP: 4.0000 mg | ORAL_TABLET | Freq: Three times a day (TID) | ORAL | Status: DC | PRN

## 2012-04-16 MED ORDER — PROMETHAZINE HCL 25 MG PO TABS: 25.0000 mg | ORAL_TABLET | Freq: Four times a day (QID) | ORAL | Status: DC | PRN

## 2012-04-16 MED ORDER — MORPHINE SULFATE 4 MG/ML IJ SOLN
2.0000 mg | Freq: Once | INTRAMUSCULAR | Status: AC
  Administered 2012-04-16: 2 mg via INTRAVENOUS
  Filled 2012-04-16: qty 1

## 2012-04-16 NOTE — ED Notes (Signed)
Patient states that she has had abdominal pain since this morning. Reports nausea. Denies vomiting or diarrhea.

## 2012-04-16 NOTE — ED Provider Notes (Signed)
History     CSN: [id number]  Arrival date & time 04/16/12  0040   First MD Initiated Contact with Patient 04/16/12 0151      Chief Complaint  Patient presents with  . Abdominal Pain    (Consider location/radiation/quality/duration/timing/severity/associated sxs/prior treatment) Patient is a 22 y.o. female presenting with abdominal pain. The history is provided by the patient. No language interpreter was used.  Abdominal Pain The primary symptoms of the illness include abdominal pain and nausea. The primary symptoms of the illness do not include fever, fatigue, shortness of breath, vomiting, diarrhea, hematemesis, hematochezia, dysuria, vaginal discharge or vaginal bleeding. The current episode started yesterday. The onset of the illness was gradual. The problem has been gradually worsening.  The patient states that she believes she is currently not pregnant. The patient has not had a change in bowel habit. Additional symptoms associated with the illness include anorexia. Symptoms associated with the illness do not include chills, diaphoresis, heartburn, constipation, urgency, hematuria or back pain. Significant associated medical issues include diabetes. Associated medical issues comments: gastroparesis.    Past Medical History  Diagnosis Date  . Diabetes mellitus   . Hypertension   . Diabetes type 1, uncontrolled 11/14/2011    Since age 63  . Gastroparesis     Past Surgical History  Procedure Date  . Ankle surgery   . Cholecystectomy 11/15/2011    Procedure: LAPAROSCOPIC CHOLECYSTECTOMY WITH INTRAOPERATIVE CHOLANGIOGRAM;  Surgeon: [name], MD;  Location: [facility];  Service: General;  Laterality: N/A;  . Laparoscopy 11/23/2011    Procedure: LAPAROSCOPY DIAGNOSTIC;  Surgeon: Edward Jolly, MD;  Location: [facility];  Service: General;  Laterality: N/A;  . Esophagogastroduodenoscopy 12/03/2011    Procedure: ESOPHAGOGASTRODUODENOSCOPY (EGD);  Surgeon: Beryle Beams, MD;   Location: [facility];  Service: Endoscopy;  Laterality: N/A;    Family History  Problem Relation Age of Onset  . Diabetes Mother   . Hypertension Father     History  Substance Use Topics  . Smoking status: Never Smoker   . Smokeless tobacco: Never Used  . Alcohol Use: Yes     Comment: Occasional    OB History    Grav Para Term Preterm Abortions TAB SAB Ect Mult Living                  Review of Systems  Constitutional: Negative for fever, chills, diaphoresis and fatigue.  Respiratory: Negative for shortness of breath.   Gastrointestinal: Positive for nausea, abdominal pain and anorexia. Negative for heartburn, vomiting, diarrhea, constipation, hematochezia and hematemesis.  Genitourinary: Negative for dysuria, urgency, hematuria, vaginal bleeding and vaginal discharge.  Musculoskeletal: Negative for back pain.    Allergies  Other  Home Medications   Current Outpatient Rx  Name  Route  Sig  Dispense  Refill  . CYCLOBENZAPRINE HCL 10 MG PO TABS   Oral   Take 10 mg by mouth at bedtime as needed. For muscle spasms         . DICYCLOMINE HCL 10 MG PO CAPS   Oral   Take 10 mg by mouth 4 (four) times daily -  before meals and at bedtime.         . ENALAPRIL MALEATE 10 MG PO TABS   Oral   Take 10 mg by mouth every evening.          Marland Kitchen GLUCERNA SHAKE PO LIQD   Oral   Take 237 mLs by mouth 3 (three) times daily between meals.         Marland Kitchen  GABAPENTIN 300 MG PO CAPS   Oral   Take 300 mg by mouth at bedtime.         . INSULIN ASPART 100 UNIT/ML [place] SOLN   Subcutaneous   Inject 0-15 Units into the skin 3 (three) times daily before meals. 5 u with each meal + sliding scale: CBG 70 - 120: 0 u: CBG 121 - 150: 2 u; CBG 151 - 200: 3 u; CBG 201 - 250: 5 u; CBG 251 - 300: 8 u;CBG 301 - 350: 11 u; CBG 351 - 400: 15 u; CBG > 400 Call MD         . INSULIN GLARGINE 100 UNIT/ML [place] SOLN   Subcutaneous   Inject 40 Units into the skin at bedtime.   10 mL   12   .  INSULIN GLARGINE 100 UNIT/ML [place] SOLN   Subcutaneous   Inject 40 Units into the skin at bedtime.         Marland Kitchen METOCLOPRAMIDE HCL 10 MG PO TABS   Oral   Take 1 tablet (10 mg total) by mouth 4 (four) times daily.   120 tablet   0   . OMEPRAZOLE 40 MG PO CPDR   Oral   Take 40 mg by mouth daily.         Marland Kitchen PROMETHAZINE HCL 25 MG PO TABS   Oral   Take 25 mg by mouth every 6 (six) hours as needed. For nausea         . ACYCLOVIR 400 MG PO TABS   Oral   Take 400 mg by mouth 2 (two) times daily as needed. For outbreaks         . CITALOPRAM HYDROBROMIDE 20 MG PO TABS      Take 1/2 tab po qd x 2 wks then 1 tab po qd   30 tablet   3   . CLONAZEPAM 0.5 MG PO TABS   Oral   Take 1 tablet (0.5 mg total) by mouth 2 (two) times daily as needed for anxiety.   20 tablet   1     BP 121/91  Pulse 119  Temp 98.1 F (36.7 C) (Oral)  Resp 17  SpO2 100%  Physical Exam  Nursing note and vitals reviewed. Constitutional: She is oriented to person, place, and time. She appears well-developed and well-nourished. No distress.  HENT:  Head: Normocephalic and atraumatic.  Right Ear: External ear normal.  Left Ear: External ear normal.  Nose: Nose normal.  Mouth/Throat: Mucous membranes are not pale, dry and not cyanotic. No oropharyngeal exudate.  Eyes: Conjunctivae normal are normal. Pupils are equal, round, and reactive to light. Right eye exhibits no discharge. Left eye exhibits no discharge. No scleral icterus.  Neck: Normal range of motion. Neck supple. No JVD present. No tracheal deviation present.  Cardiovascular: Regular rhythm, S1 normal, S2 normal, normal heart sounds, intact distal pulses and normal pulses.   No extrasystoles are present. Tachycardia present.  PMI is not displaced.  Exam reveals no gallop and no friction rub.   Pulmonary/Chest: Effort normal and breath sounds normal. No stridor. No respiratory distress. She has no wheezes. She has no rales. She exhibits no  tenderness.  Abdominal: Soft. Bowel sounds are normal. She exhibits no distension and no mass. There is tenderness (epigastric and LUQ). There is no rebound and no guarding.  Musculoskeletal: Normal range of motion. She exhibits no edema and no tenderness.  Lymphadenopathy:    She has no  cervical adenopathy.  Neurological: She is alert and oriented to person, place, and time.  Skin: Skin is warm and dry. No rash noted. She is not diaphoretic. No erythema. No pallor.  Psychiatric: She has a normal mood and affect. Her behavior is normal.    ED Course  Procedures (including critical care time)  Labs Reviewed  GLUCOSE, CAPILLARY - Abnormal; Notable for the following:    Glucose-Capillary 254 (*)     All other components within normal limits  CBC  COMPREHENSIVE METABOLIC PANEL  URINALYSIS, ROUTINE W REFLEX MICROSCOPIC  PREGNANCY, URINE   No results found.   No diagnosis found.    MDM  Pt presents for evaluation of abdominal discomfort.  She is a type 1 diabetic with a known hx of gastroparesis.  She thinks this discomfort feels similar to previous issues with gastroparesis.  She appears nontoxic, note tachycardia with otherwise normal VS, NAD.  Will obtain basic labs, urinalysis, and urine pregnancy test.  Will administer IVF, morphine, and reglan.  Will reassess.  She has had issues with DKA previously.  Will monitor closely.  1779.  Pt stable, NAD.  Discomfort has resolved.  She has tolerated po fluids.  She has no anion gap and no urine ketones.  Plan discharge home.      Perlie Mayo, MD 04/16/12 [phone]

## 2012-04-16 NOTE — ED Notes (Addendum)
Pt asked about her last CBG, stated she took it "earlier today" and is was under 200.  Pt states she has been able to eat today, and since her CBG was taken.  Pt seen at [facility] on 1/16 with hyperglycemia and last CBG was 222.

## 2012-04-16 NOTE — ED Notes (Signed)
Pt finished fluids and crackers and denies nausea at this time.

## 2012-05-08 ENCOUNTER — Encounter (HOSPITAL_COMMUNITY): Payer: Self-pay | Admitting: Emergency Medicine

## 2012-05-08 ENCOUNTER — Emergency Department (HOSPITAL_COMMUNITY)
Admission: EM | Admit: 2012-05-08 | Discharge: 2012-05-09 | Disposition: A | Discharge status: Home / Self Care | Payer: 59 | Attending: Emergency Medicine | Admitting: Emergency Medicine

## 2012-05-08 DIAGNOSIS — Z794 Long term (current) use of insulin: Secondary | ICD-10-CM | POA: Insufficient documentation

## 2012-05-08 DIAGNOSIS — Z79899 Other long term (current) drug therapy: Secondary | ICD-10-CM | POA: Insufficient documentation

## 2012-05-08 DIAGNOSIS — R110 Nausea: Secondary | ICD-10-CM | POA: Insufficient documentation

## 2012-05-08 DIAGNOSIS — E1049 Type 1 diabetes mellitus with other diabetic neurological complication: Secondary | ICD-10-CM | POA: Insufficient documentation

## 2012-05-08 DIAGNOSIS — R739 Hyperglycemia, unspecified: Secondary | ICD-10-CM

## 2012-05-08 DIAGNOSIS — R000 Tachycardia, unspecified: Secondary | ICD-10-CM | POA: Insufficient documentation

## 2012-05-08 DIAGNOSIS — R0602 Shortness of breath: Secondary | ICD-10-CM | POA: Insufficient documentation

## 2012-05-08 DIAGNOSIS — Z9119 Patient's noncompliance with other medical treatment and regimen: Secondary | ICD-10-CM | POA: Insufficient documentation

## 2012-05-08 DIAGNOSIS — M79609 Pain in unspecified limb: Secondary | ICD-10-CM | POA: Insufficient documentation

## 2012-05-08 DIAGNOSIS — E1065 Type 1 diabetes mellitus with hyperglycemia: Secondary | ICD-10-CM | POA: Insufficient documentation

## 2012-05-08 DIAGNOSIS — K3184 Gastroparesis: Secondary | ICD-10-CM | POA: Insufficient documentation

## 2012-05-08 DIAGNOSIS — I10 Essential (primary) hypertension: Secondary | ICD-10-CM | POA: Insufficient documentation

## 2012-05-08 DIAGNOSIS — Z9114 Patient's other noncompliance with medication regimen: Secondary | ICD-10-CM

## 2012-05-08 DIAGNOSIS — Z91199 Patient's noncompliance with other medical treatment and regimen due to unspecified reason: Secondary | ICD-10-CM | POA: Insufficient documentation

## 2012-05-08 DIAGNOSIS — R231 Pallor: Secondary | ICD-10-CM | POA: Insufficient documentation

## 2012-05-08 LAB — CBC WITH DIFFERENTIAL/PLATELET
Basophils Absolute: 0 10*3/uL (ref 0.0–0.1)
Basophils Relative: 0 % (ref 0–1)
Eosinophils Absolute: 0 10*3/uL (ref 0.0–0.7)
Eosinophils Relative: 0 % (ref 0–5)
HCT: 41.8 % (ref 36.0–46.0)
Hemoglobin: 15.1 g/dL — ABNORMAL HIGH (ref 12.0–15.0)
Lymphocytes Relative: 34 % (ref 12–46)
Lymphs Abs: 2.1 10*3/uL (ref 0.7–4.0)
MCH: 30.9 pg (ref 26.0–34.0)
MCHC: 36.1 g/dL — ABNORMAL HIGH (ref 30.0–36.0)
MCV: 85.7 fL (ref 78.0–100.0)
Monocytes Absolute: 0.3 10*3/uL (ref 0.1–1.0)
Monocytes Relative: 4 % (ref 3–12)
Neutro Abs: 3.9 10*3/uL (ref 1.7–7.7)
Neutrophils Relative %: 62 % (ref 43–77)
Platelets: 247 10*3/uL (ref 150–400)
RBC: 4.88 MIL/uL (ref 3.87–5.11)
RDW: 11.6 % (ref 11.5–15.5)
WBC: 6.3 10*3/uL (ref 4.0–10.5)

## 2012-05-08 LAB — POCT I-STAT, CHEM 8
BUN: 7 mg/dL (ref 6–23)
Calcium, Ion: 1.18 mmol/L (ref 1.12–1.23)
Chloride: 101 mEq/L (ref 96–112)
Creatinine, Ser: 0.4 mg/dL — ABNORMAL LOW (ref 0.50–1.10)
Glucose, Bld: 328 mg/dL — ABNORMAL HIGH (ref 70–99)
HCT: 47 % — ABNORMAL HIGH (ref 36.0–46.0)
Hemoglobin: 16 g/dL — ABNORMAL HIGH (ref 12.0–15.0)
Potassium: 4.5 mEq/L (ref 3.5–5.1)
Sodium: 133 mEq/L — ABNORMAL LOW (ref 135–145)
TCO2: 21 mmol/L (ref 0–100)

## 2012-05-08 LAB — BLOOD GAS, VENOUS
Acid-base deficit: 4.7 mmol/L — ABNORMAL HIGH (ref 0.0–2.0)
Bicarbonate: 21.1 mEq/L (ref 20.0–24.0)
FIO2: 0.21 %
O2 Saturation: 54.2 %
Patient temperature: 98.6
TCO2: 19 mmol/L (ref 0–100)
pCO2, Ven: 43.5 mmHg — ABNORMAL LOW (ref 45.0–50.0)
pH, Ven: 7.307 — ABNORMAL HIGH (ref 7.250–7.300)
pO2, Ven: 30.3 mmHg (ref 30.0–45.0)

## 2012-05-08 MED ORDER — SODIUM CHLORIDE 0.9 % IV BOLUS (SEPSIS)
1000.0000 mL | Freq: Once | INTRAVENOUS | Status: AC
  Administered 2012-05-08: 1000 mL via INTRAVENOUS

## 2012-05-08 MED ORDER — LORAZEPAM 2 MG/ML IJ SOLN
1.0000 mg | Freq: Once | INTRAMUSCULAR | Status: AC
  Administered 2012-05-08: 1 mg via INTRAVENOUS
  Filled 2012-05-08: qty 1

## 2012-05-08 NOTE — ED Notes (Signed)
Pt c/o R thigh pain, denies injury. Pt c/o epigastric pain, burning onset today, denies n/v/d. Denies fever. Pt c/o shob today after feeling she may have had anxiety attack. Pt states she has been very anxious lately.

## 2012-05-08 NOTE — ED Notes (Addendum)
States that she has hx of gastroparesis. She is currently c/o pain in the epigastric region that does not radiate. Pt states it feels like a stomachache and burning. Pt states it feels like when she has had pain r/t to her condition in the past. Pt is also c/o pain to the rt thigh. No swelling or deformity noted. Leg is tender to palpation. Pt c/o SOB and believes it r/t to anxiety. Sats are 100% on ra but pt is tachycardic at 136.

## 2012-05-08 NOTE — ED Provider Notes (Signed)
History     CSN: [id number]  Arrival date & time 05/08/12  4098   First MD Initiated Contact with Patient 05/08/12 2004      Chief Complaint  Patient presents with  . Abdominal Pain  . Shortness of Breath  . Leg Pain    (Consider location/radiation/quality/duration/timing/severity/associated sxs/prior treatment) HPI Comments: IDDM who has not taken her insulin  For the past 4 days now with elevated blood sugar, generalized abdominal pain ,SOB without cough and L thigh pain  States took Ibuprofen yesterday without relief denies vomiting/diarrhea  Patient is a 22 y.o. female presenting with abdominal pain, shortness of breath, and leg pain. The history is provided by the patient.  Abdominal Pain Pain location:  Generalized Pain quality: aching   Pain radiates to:  Does not radiate Associated symptoms: nausea and shortness of breath   Associated symptoms: no chest pain, no chills, no cough, no fever and no vomiting   Shortness of Breath Associated symptoms: abdominal pain   Associated symptoms: no chest pain, no cough, no fever, no rash, no vomiting and no wheezing   Leg Pain Associated symptoms: no fever     Past Medical History  Diagnosis Date  . Diabetes mellitus   . Hypertension   . Diabetes type 1, uncontrolled 11/14/2011    Since age 56  . Gastroparesis     Past Surgical History  Procedure Laterality Date  . Ankle surgery    . Cholecystectomy  11/15/2011    Procedure: LAPAROSCOPIC CHOLECYSTECTOMY WITH INTRAOPERATIVE CHOLANGIOGRAM;  Surgeon: [name], MD;  Location: [facility];  Service: General;  Laterality: N/A;  . Laparoscopy  11/23/2011    Procedure: LAPAROSCOPY DIAGNOSTIC;  Surgeon: Mariella Saa, MD;  Location: [facility];  Service: General;  Laterality: N/A;  . Esophagogastroduodenoscopy  12/03/2011    Procedure: ESOPHAGOGASTRODUODENOSCOPY (EGD);  Surgeon: Theda Belfast, MD;  Location: [facility];  Service: Endoscopy;  Laterality: N/A;    Family  History  Problem Relation Age of Onset  . Diabetes Mother   . Hypertension Father     History  Substance Use Topics  . Smoking status: Never Smoker   . Smokeless tobacco: Never Used  . Alcohol Use: Yes     Comment: Occasional    OB History   Grav Para Term Preterm Abortions TAB SAB Ect Mult Living                  Review of Systems  Constitutional: Negative for fever and chills.  HENT: Negative for rhinorrhea.   Eyes: Negative for visual disturbance.  Respiratory: Positive for shortness of breath. Negative for cough and wheezing.   Cardiovascular: Negative for chest pain and leg swelling.  Gastrointestinal: Positive for nausea and abdominal pain. Negative for vomiting.  Musculoskeletal: Negative for joint swelling.  Skin: Negative for rash and wound.    Allergies  Other  Home Medications   Current Outpatient Rx  Name  Route  Sig  Dispense  Refill  . acyclovir (ZOVIRAX) 400 MG tablet   Oral   Take 400 mg by mouth 2 (two) times daily as needed. For outbreaks         . citalopram (CELEXA) 20 MG tablet   Oral   Take 20 mg by mouth at bedtime.         . clonazePAM (KLONOPIN) 0.5 MG tablet   Oral   Take 1 tablet (0.5 mg total) by mouth 2 (two) times daily as needed for anxiety.  20 tablet   1   . cyclobenzaprine (FLEXERIL) 10 MG tablet   Oral   Take 10 mg by mouth at bedtime as needed. For muscle spasms         . dicyclomine (BENTYL) 10 MG capsule   Oral   Take 10 mg by mouth 4 (four) times daily -  before meals and at bedtime.         . enalapril (VASOTEC) 10 MG tablet   Oral   Take 10 mg by mouth every evening.          . feeding supplement (GLUCERNA SHAKE) LIQD   Oral   Take 237 mLs by mouth 3 (three) times daily as needed (for appetite when needed).         . gabapentin (NEURONTIN) 300 MG capsule   Oral   Take 300 mg by mouth at bedtime.         . insulin aspart (NOVOLOG) 100 UNIT/ML injection   Subcutaneous   Inject 0-15 Units  into the skin 3 (three) times daily before meals. 5 u with each meal + sliding scale: CBG 70 - 120: 0 u: CBG 121 - 150: 2 u; CBG 151 - 200: 3 u; CBG 201 - 250: 5 u; CBG 251 - 300: 8 u;CBG 301 - 350: 11 u; CBG 351 - 400: 15 u; CBG > 400 Call MD         . insulin glargine (LANTUS) 100 UNIT/ML injection   Subcutaneous   Inject 40 Units into the skin at bedtime.         . metoCLOPramide (REGLAN) 10 MG tablet   Oral   Take 1 tablet (10 mg total) by mouth 4 (four) times daily.   120 tablet   0   . omeprazole (PRILOSEC) 40 MG capsule   Oral   Take 40 mg by mouth daily.         . promethazine (PHENERGAN) 25 MG tablet   Oral   Take 25 mg by mouth every 6 (six) hours as needed. For nausea           BP 136/85  Pulse 121  Temp(Src) 98 F (36.7 C) (Oral)  Resp 18  SpO2 99%  Physical Exam  Constitutional: She is oriented to person, place, and time. She appears well-developed and well-nourished.  HENT:  Head: Normocephalic and atraumatic.  Eyes: Pupils are equal, round, and reactive to light.  Neck: Normal range of motion.  Cardiovascular: Tachycardia present.   Pulmonary/Chest: Effort normal and breath sounds normal. She has no wheezes.  Abdominal: Soft. She exhibits no distension. There is no tenderness.  Musculoskeletal: Normal range of motion. She exhibits no edema and no tenderness.  Neurological: She is alert and oriented to person, place, and time.  Skin: Skin is warm. No rash noted. There is pallor.    ED Course  Procedures (including critical care time)  Labs Reviewed  CBC WITH DIFFERENTIAL - Abnormal; Notable for the following:    Hemoglobin 15.1 (*)    MCHC 36.1 (*)    All other components within normal limits  BLOOD GAS, VENOUS - Abnormal; Notable for the following:    pH, Ven 7.307 (*)    pCO2, Ven 43.5 (*)    Acid-base deficit 4.7 (*)    All other components within normal limits  GLUCOSE, CAPILLARY - Abnormal; Notable for the following:     Glucose-Capillary 260 (*)    All other components within normal limits  GLUCOSE, CAPILLARY - Abnormal; Notable for the following:    Glucose-Capillary 269 (*)    All other components within normal limits  GLUCOSE, CAPILLARY - Abnormal; Notable for the following:    Glucose-Capillary 205 (*)    All other components within normal limits  POCT I-STAT, CHEM 8 - Abnormal; Notable for the following:    Sodium 133 (*)    Creatinine, Ser 0.40 (*)    Glucose, Bld 328 (*)    Hemoglobin 16.0 (*)    HCT 47.0 (*)    All other components within normal limits   No results found.   1. Hyperglycemia without ketosis   2. Gastroparalysis   3. Noncompliance with medication regimen       MDM   At time of DC BS down to 205 pain has been controlled with Bentyl and Toradol         Arman Filter, NP 05/09/12 [phone]

## 2012-05-09 LAB — GLUCOSE, CAPILLARY
Glucose-Capillary: 205 mg/dL — ABNORMAL HIGH (ref 70–99)
Glucose-Capillary: 260 mg/dL — ABNORMAL HIGH (ref 70–99)
Glucose-Capillary: 269 mg/dL — ABNORMAL HIGH (ref 70–99)

## 2012-05-09 MED ORDER — INSULIN ASPART 100 UNIT/ML ~~LOC~~ SOLN
8.0000 [IU] | Freq: Once | SUBCUTANEOUS | Status: AC
  Administered 2012-05-09: 8 [IU] via SUBCUTANEOUS
  Filled 2012-05-09: qty 8

## 2012-05-09 MED ORDER — SODIUM CHLORIDE 0.9 % IV BOLUS (SEPSIS)
1000.0000 mL | Freq: Once | INTRAVENOUS | Status: AC
  Administered 2012-05-09: 1000 mL via INTRAVENOUS

## 2012-05-09 MED ORDER — KETOROLAC TROMETHAMINE 30 MG/ML IJ SOLN
30.0000 mg | Freq: Once | INTRAMUSCULAR | Status: AC
Start: 2012-05-09 — End: 2012-05-09
  Administered 2012-05-09: 30 mg via INTRAVENOUS
  Filled 2012-05-09: qty 1

## 2012-05-09 MED ORDER — DICYCLOMINE HCL 20 MG PO TABS
10.0000 mg | ORAL_TABLET | Freq: Once | ORAL | Status: AC
  Administered 2012-05-09: 10 mg via ORAL
  Filled 2012-05-09: qty 1

## 2012-05-11 NOTE — ED Provider Notes (Signed)
Medical screening examination/treatment/procedure(s) were performed by non-physician practitioner and as supervising physician I was immediately available for consultation/collaboration.   Wandra Arthurs, MD 05/11/12 1455

## 2012-08-02 ENCOUNTER — Telehealth: Payer: Self-pay | Admitting: Radiology

## 2012-08-02 DIAGNOSIS — F418 Other specified anxiety disorders: Secondary | ICD-10-CM

## 2012-08-02 NOTE — Telephone Encounter (Signed)
Fax Copy for her Klonopin. Please advise.

## 2012-08-02 NOTE — Telephone Encounter (Signed)
No, I just saw pt once for acute hyperglycemia - she was going to establish w/ [facility]. If they don't want to rx her medicine, we can refer her to psychiatry.  If she doesn't want to wait for the referral, she will need to follow up in clinic to see what treatment is most appropriate until we can arrange for more definitive management.

## 2012-08-03 NOTE — Telephone Encounter (Signed)
Sent denial. Called patient, patient is going to [facility]. And they have been filling this for her.  She is unsure why request was sent here.

## 2012-09-01 ENCOUNTER — Encounter (HOSPITAL_COMMUNITY): Payer: Self-pay | Admitting: *Deleted

## 2012-09-01 ENCOUNTER — Inpatient Hospital Stay (HOSPITAL_COMMUNITY)
Admission: EM | Admit: 2012-09-01 | Discharge: 2012-09-02 | DRG: 639 | Disposition: A | Discharge status: Home / Self Care | Payer: 59 | Attending: Internal Medicine | Admitting: Internal Medicine

## 2012-09-01 DIAGNOSIS — Z794 Long term (current) use of insulin: Secondary | ICD-10-CM

## 2012-09-01 DIAGNOSIS — K3184 Gastroparesis: Secondary | ICD-10-CM | POA: Diagnosis present

## 2012-09-01 DIAGNOSIS — E1065 Type 1 diabetes mellitus with hyperglycemia: Secondary | ICD-10-CM | POA: Diagnosis present

## 2012-09-01 DIAGNOSIS — Z9119 Patient's noncompliance with other medical treatment and regimen: Secondary | ICD-10-CM

## 2012-09-01 DIAGNOSIS — I152 Hypertension secondary to endocrine disorders: Secondary | ICD-10-CM | POA: Diagnosis present

## 2012-09-01 DIAGNOSIS — E1159 Type 2 diabetes mellitus with other circulatory complications: Secondary | ICD-10-CM | POA: Diagnosis present

## 2012-09-01 DIAGNOSIS — E1165 Type 2 diabetes mellitus with hyperglycemia: Secondary | ICD-10-CM | POA: Diagnosis present

## 2012-09-01 DIAGNOSIS — E860 Dehydration: Secondary | ICD-10-CM | POA: Diagnosis present

## 2012-09-01 DIAGNOSIS — E1049 Type 1 diabetes mellitus with other diabetic neurological complication: Secondary | ICD-10-CM | POA: Diagnosis present

## 2012-09-01 DIAGNOSIS — E1142 Type 2 diabetes mellitus with diabetic polyneuropathy: Secondary | ICD-10-CM | POA: Diagnosis present

## 2012-09-01 DIAGNOSIS — Z91199 Patient's noncompliance with other medical treatment and regimen due to unspecified reason: Secondary | ICD-10-CM

## 2012-09-01 DIAGNOSIS — R109 Unspecified abdominal pain: Secondary | ICD-10-CM | POA: Diagnosis present

## 2012-09-01 DIAGNOSIS — Z79899 Other long term (current) drug therapy: Secondary | ICD-10-CM

## 2012-09-01 DIAGNOSIS — E1110 Type 2 diabetes mellitus with ketoacidosis without coma: Secondary | ICD-10-CM

## 2012-09-01 DIAGNOSIS — IMO0001 Reserved for inherently not codable concepts without codable children: Secondary | ICD-10-CM | POA: Diagnosis present

## 2012-09-01 DIAGNOSIS — R112 Nausea with vomiting, unspecified: Secondary | ICD-10-CM | POA: Diagnosis present

## 2012-09-01 DIAGNOSIS — E1010 Type 1 diabetes mellitus with ketoacidosis without coma: Principal | ICD-10-CM | POA: Diagnosis present

## 2012-09-01 DIAGNOSIS — I10 Essential (primary) hypertension: Secondary | ICD-10-CM | POA: Diagnosis present

## 2012-09-01 LAB — BASIC METABOLIC PANEL
BUN: 8 mg/dL (ref 6–23)
BUN: 9 mg/dL (ref 6–23)
CO2: 20 mEq/L (ref 19–32)
CO2: 21 mEq/L (ref 19–32)
Calcium: 8.3 mg/dL — ABNORMAL LOW (ref 8.4–10.5)
Calcium: 8.3 mg/dL — ABNORMAL LOW (ref 8.4–10.5)
Chloride: 107 mEq/L (ref 96–112)
Chloride: 104 mEq/L (ref 96–112)
Creatinine, Ser: 0.32 mg/dL — ABNORMAL LOW (ref 0.50–1.10)
Creatinine, Ser: 0.32 mg/dL — ABNORMAL LOW (ref 0.50–1.10)
GFR calc Af Amer: >90 mL/min (ref >90)
GFR calc Af Amer: >90 mL/min (ref >90)
GFR calc non Af Amer: >90 mL/min (ref >90)
GFR calc non Af Amer: >90 mL/min (ref >90)
Glucose, Bld: 98 mg/dL (ref 70–99)
Glucose, Bld: 282 mg/dL — ABNORMAL HIGH (ref 70–99)
Potassium: 3 mEq/L — ABNORMAL LOW (ref 3.5–5.1)
Potassium: 3.9 mEq/L (ref 3.5–5.1)
Sodium: 138 mEq/L (ref 135–145)
Sodium: 135 mEq/L (ref 135–145)

## 2012-09-01 LAB — COMPREHENSIVE METABOLIC PANEL
ALT: 12 U/L (ref 0–35)
AST: 21 U/L (ref 0–37)
Albumin: 3.4 g/dL — ABNORMAL LOW (ref 3.5–5.2)
Alkaline Phosphatase: 169 U/L — ABNORMAL HIGH (ref 39–117)
BUN: 9 mg/dL (ref 6–23)
CO2: 10 mEq/L — CL (ref 19–32)
Calcium: 8.9 mg/dL (ref 8.4–10.5)
Chloride: 91 mEq/L — ABNORMAL LOW (ref 96–112)
Creatinine, Ser: 0.35 mg/dL — ABNORMAL LOW (ref 0.50–1.10)
GFR calc Af Amer: >90 mL/min (ref >90)
GFR calc non Af Amer: >90 mL/min (ref >90)
Glucose, Bld: 630 mg/dL (ref 70–99)
Potassium: 4 mEq/L (ref 3.5–5.1)
Sodium: 128 mEq/L — ABNORMAL LOW (ref 135–145)
Total Bilirubin: 0.3 mg/dL (ref 0.3–1.2)
Total Protein: 7 g/dL (ref 6.0–8.3)

## 2012-09-01 LAB — CBC WITH DIFFERENTIAL/PLATELET
Basophils Absolute: 0 10*3/uL (ref 0.0–0.1)
Basophils Relative: 1 % (ref 0–1)
Eosinophils Absolute: 0.1 10*3/uL (ref 0.0–0.7)
Eosinophils Relative: 2 % (ref 0–5)
HCT: 34.8 % — ABNORMAL LOW (ref 36.0–46.0)
Hemoglobin: 12.2 g/dL (ref 12.0–15.0)
Lymphocytes Relative: 36 % (ref 12–46)
Lymphs Abs: 2.1 10*3/uL (ref 0.7–4.0)
MCH: 32.4 pg (ref 26.0–34.0)
MCHC: 35.1 g/dL (ref 30.0–36.0)
MCV: 92.3 fL (ref 78.0–100.0)
Monocytes Absolute: 0.6 10*3/uL (ref 0.1–1.0)
Monocytes Relative: 11 % (ref 3–12)
Neutro Abs: 2.9 10*3/uL (ref 1.7–7.7)
Neutrophils Relative %: 51 % (ref 43–77)
Platelets: 250 10*3/uL (ref 150–400)
RBC: 3.77 MIL/uL — ABNORMAL LOW (ref 3.87–5.11)
RDW: 12.4 % (ref 11.5–15.5)
WBC: 5.7 10*3/uL (ref 4.0–10.5)

## 2012-09-01 LAB — HEMOGLOBIN A1C
Hgb A1c MFr Bld: 11.9 % — ABNORMAL HIGH (ref <5.7)
Mean Plasma Glucose: 295 mg/dL — ABNORMAL HIGH (ref <117)

## 2012-09-01 LAB — BLOOD GAS, VENOUS
Acid-base deficit: 15.5 mmol/L — ABNORMAL HIGH (ref 0.0–2.0)
Bicarbonate: 10.5 mEq/L — ABNORMAL LOW (ref 20.0–24.0)
FIO2: 0.21 %
O2 Saturation: 88.5 %
Patient temperature: 98.6
TCO2: 9.9 mmol/L (ref 0–100)
pCO2, Ven: 25.8 mmHg — ABNORMAL LOW (ref 45.0–50.0)
pH, Ven: 7.233 — ABNORMAL LOW (ref 7.250–7.300)
pO2, Ven: 61 mmHg — ABNORMAL HIGH (ref 30.0–45.0)

## 2012-09-01 LAB — CBC
HCT: 30.3 % — ABNORMAL LOW (ref 36.0–46.0)
Hemoglobin: 10.8 g/dL — ABNORMAL LOW (ref 12.0–15.0)
MCH: 32.5 pg (ref 26.0–34.0)
MCHC: 35.6 g/dL (ref 30.0–36.0)
MCV: 91.3 fL (ref 78.0–100.0)
Platelets: 208 10*3/uL (ref 150–400)
RBC: 3.32 MIL/uL — ABNORMAL LOW (ref 3.87–5.11)
RDW: 12.1 % (ref 11.5–15.5)
WBC: 6 10*3/uL (ref 4.0–10.5)

## 2012-09-01 LAB — POCT PREGNANCY, URINE: Preg Test, Ur: NEGATIVE

## 2012-09-01 LAB — URINE MICROSCOPIC-ADD ON

## 2012-09-01 LAB — GLUCOSE, CAPILLARY
Glucose-Capillary: >600 mg/dL (ref 70–99)
Glucose-Capillary: 362 mg/dL — ABNORMAL HIGH (ref 70–99)
Glucose-Capillary: 244 mg/dL — ABNORMAL HIGH (ref 70–99)
Glucose-Capillary: 206 mg/dL — ABNORMAL HIGH (ref 70–99)
Glucose-Capillary: 119 mg/dL — ABNORMAL HIGH (ref 70–99)
Glucose-Capillary: 80 mg/dL (ref 70–99)
Glucose-Capillary: 154 mg/dL — ABNORMAL HIGH (ref 70–99)
Glucose-Capillary: 224 mg/dL — ABNORMAL HIGH (ref 70–99)
Glucose-Capillary: 326 mg/dL — ABNORMAL HIGH (ref 70–99)

## 2012-09-01 LAB — URINALYSIS, ROUTINE W REFLEX MICROSCOPIC
Bilirubin Urine: NEGATIVE
Glucose, UA: >1000 mg/dL — AB
Hgb urine dipstick: NEGATIVE
Ketones, ur: >80 mg/dL — AB
Leukocytes, UA: NEGATIVE
Nitrite: NEGATIVE
Protein, ur: NEGATIVE mg/dL
Specific Gravity, Urine: 1.038 — ABNORMAL HIGH (ref 1.005–1.030)
Urobilinogen, UA: 0.2 mg/dL (ref 0.0–1.0)
pH: 6 (ref 5.0–8.0)

## 2012-09-01 LAB — LIPASE, BLOOD: Lipase: 106 U/L — ABNORMAL HIGH (ref 11–59)

## 2012-09-01 LAB — MRSA PCR SCREENING: MRSA by PCR: NEGATIVE

## 2012-09-01 MED ORDER — ALUM & MAG HYDROXIDE-SIMETH 200-200-20 MG/5ML PO SUSP: 30.0000 mL | Freq: Four times a day (QID) | ORAL | Status: DC | PRN

## 2012-09-01 MED ORDER — DEXTROSE 50 % IV SOLN: 25.0000 mL | INTRAVENOUS | Status: DC | PRN

## 2012-09-01 MED ORDER — ONDANSETRON HCL 4 MG/2ML IJ SOLN: 4.0000 mg | Freq: Four times a day (QID) | INTRAMUSCULAR | Status: DC | PRN

## 2012-09-01 MED ORDER — METOCLOPRAMIDE HCL 10 MG PO TABS
10.0000 mg | ORAL_TABLET | Freq: Three times a day (TID) | ORAL | Status: DC
  Administered 2012-09-01 – 2012-09-02 (×4): 10 mg via ORAL
  Filled 2012-09-01 (×5): qty 1

## 2012-09-01 MED ORDER — INSULIN ASPART 100 UNIT/ML ~~LOC~~ SOLN
0.0000 [IU] | Freq: Every day | SUBCUTANEOUS | Status: DC
  Administered 2012-09-01: 4 [IU] via SUBCUTANEOUS

## 2012-09-01 MED ORDER — ENOXAPARIN SODIUM 40 MG/0.4ML ~~LOC~~ SOLN
40.0000 mg | SUBCUTANEOUS | Status: DC
  Administered 2012-09-01: 40 mg via SUBCUTANEOUS
  Filled 2012-09-01 (×2): qty 0.4

## 2012-09-01 MED ORDER — SODIUM CHLORIDE 0.9 % IV SOLN
1000.0000 mL | Freq: Once | INTRAVENOUS | Status: AC
  Administered 2012-09-01: 1000 mL via INTRAVENOUS

## 2012-09-01 MED ORDER — SODIUM CHLORIDE 0.9 % IV SOLN: INTRAVENOUS | Status: DC

## 2012-09-01 MED ORDER — INSULIN GLARGINE 100 UNIT/ML ~~LOC~~ SOLN
28.0000 [IU] | Freq: Every day | SUBCUTANEOUS | Status: DC
  Administered 2012-09-01: 28 [IU] via SUBCUTANEOUS
  Filled 2012-09-01 (×2): qty 0.28

## 2012-09-01 MED ORDER — DEXTROSE-NACL 5-0.45 % IV SOLN
INTRAVENOUS | Status: DC
  Administered 2012-09-01: 06:00:00 via INTRAVENOUS

## 2012-09-01 MED ORDER — PANTOPRAZOLE SODIUM 40 MG PO TBEC
40.0000 mg | DELAYED_RELEASE_TABLET | Freq: Every day | ORAL | Status: DC
  Administered 2012-09-01: 40 mg via ORAL
  Filled 2012-09-01 (×2): qty 1

## 2012-09-01 MED ORDER — POTASSIUM CHLORIDE CRYS ER 20 MEQ PO TBCR
40.0000 meq | EXTENDED_RELEASE_TABLET | Freq: Once | ORAL | Status: AC
  Administered 2012-09-01: 40 meq via ORAL
  Filled 2012-09-01: qty 2

## 2012-09-01 MED ORDER — SODIUM CHLORIDE 0.9 % IV BOLUS (SEPSIS)
500.0000 mL | Freq: Once | INTRAVENOUS | Status: AC
  Administered 2012-09-01: 500 mL via INTRAVENOUS

## 2012-09-01 MED ORDER — HYDROMORPHONE HCL PF 1 MG/ML IJ SOLN: 0.5000 mg | INTRAMUSCULAR | Status: DC | PRN

## 2012-09-01 MED ORDER — POTASSIUM CHLORIDE 10 MEQ/100ML IV SOLN
10.0000 meq | INTRAVENOUS | Status: AC
  Administered 2012-09-01 (×2): 10 meq via INTRAVENOUS
  Filled 2012-09-01 (×2): qty 100

## 2012-09-01 MED ORDER — ENOXAPARIN SODIUM 40 MG/0.4ML ~~LOC~~ SOLN: 40.0000 mg | SUBCUTANEOUS | Status: DC

## 2012-09-01 MED ORDER — INSULIN ASPART 100 UNIT/ML ~~LOC~~ SOLN
0.0000 [IU] | Freq: Three times a day (TID) | SUBCUTANEOUS | Status: DC
  Administered 2012-09-01: 5 [IU] via SUBCUTANEOUS
  Administered 2012-09-01: 3 [IU] via SUBCUTANEOUS
  Administered 2012-09-02: 5 [IU] via SUBCUTANEOUS

## 2012-09-01 MED ORDER — INSULIN REGULAR HUMAN 100 UNIT/ML IJ SOLN
INTRAMUSCULAR | Status: DC
  Filled 2012-09-01: qty 1

## 2012-09-01 MED ORDER — ONDANSETRON HCL 4 MG PO TABS: 4.0000 mg | ORAL_TABLET | Freq: Four times a day (QID) | ORAL | Status: DC | PRN

## 2012-09-01 MED ORDER — SODIUM CHLORIDE 0.9 % IV SOLN
1000.0000 mL | INTRAVENOUS | Status: DC
  Administered 2012-09-01: 1000 mL via INTRAVENOUS

## 2012-09-01 MED ORDER — METOCLOPRAMIDE HCL 5 MG/ML IJ SOLN
10.0000 mg | Freq: Once | INTRAMUSCULAR | Status: AC
  Administered 2012-09-01: 10 mg via INTRAVENOUS
  Filled 2012-09-01: qty 2

## 2012-09-01 MED ORDER — DEXTROSE-NACL 5-0.45 % IV SOLN: INTRAVENOUS | Status: AC

## 2012-09-01 MED ORDER — SODIUM CHLORIDE 0.9 % IV SOLN
INTRAVENOUS | Status: DC
  Administered 2012-09-01: 5.7 [IU]/h via INTRAVENOUS
  Filled 2012-09-01: qty 1

## 2012-09-01 MED ORDER — SODIUM CHLORIDE 0.9 % IV BOLUS (SEPSIS)
1000.0000 mL | Freq: Once | INTRAVENOUS | Status: AC
  Administered 2012-09-01: 1000 mL via INTRAVENOUS

## 2012-09-01 NOTE — ED Notes (Signed)
Pt able to ambulate to restroom with no difficulty.

## 2012-09-01 NOTE — ED Notes (Signed)
Dondra Spry NP notified of critical CO2 and glucose

## 2012-09-01 NOTE — ED Notes (Signed)
Pt in c/o abd pain that radiates into her chest, states she has gastroparesis and this is typical of her episodes, also c/o nausea

## 2012-09-01 NOTE — ED Provider Notes (Signed)
History     CSN: [id number]  Arrival date & time 09/01/12  0057   First MD Initiated Contact with Patient 09/01/12 0247      Chief Complaint  Patient presents with  . Abdominal Pain    (Consider location/radiation/quality/duration/timing/severity/associated sxs/prior treatment) HPI Comments: Generalized abdominal discomfort, nausea, no diarrhea, or vomiting.  Blood sugars greater than 600.  She has not taken insulin in over 24 hours, having eaten past.  Ice cream, and tied, yesterday.  She has a history of medication noncompliance, and gastroparesis  Patient is a 22 y.o. female presenting with abdominal pain. The history is provided by the patient.  Abdominal Pain This is a recurrent problem. The current episode started today. The problem has been unchanged. Associated symptoms include abdominal pain and nausea. Pertinent negatives include no chest pain, chills, coughing, fever or vomiting. The symptoms are aggravated by drinking. She has tried nothing for the symptoms. The treatment provided no relief.    Past Medical History  Diagnosis Date  . Diabetes mellitus   . Hypertension   . Diabetes type 1, uncontrolled 11/14/2011    Since age 10  . Gastroparesis     Past Surgical History  Procedure Laterality Date  . Ankle surgery    . Cholecystectomy  11/15/2011    Procedure: LAPAROSCOPIC CHOLECYSTECTOMY WITH INTRAOPERATIVE CHOLANGIOGRAM;  Surgeon: [name], MD;  Location: [facility];  Service: General;  Laterality: N/A;  . Laparoscopy  11/23/2011    Procedure: LAPAROSCOPY DIAGNOSTIC;  Surgeon: Mariella Saa, MD;  Location: [facility];  Service: General;  Laterality: N/A;  . Esophagogastroduodenoscopy  12/03/2011    Procedure: ESOPHAGOGASTRODUODENOSCOPY (EGD);  Surgeon: Theda Belfast, MD;  Location: [facility];  Service: Endoscopy;  Laterality: N/A;    Family History  Problem Relation Age of Onset  . Diabetes Mother   . Hypertension Father     History  Substance Use  Topics  . Smoking status: Never Smoker   . Smokeless tobacco: Never Used  . Alcohol Use: Yes     Comment: Occasional    OB History   Grav Para Term Preterm Abortions TAB SAB Ect Mult Living                  Review of Systems  Constitutional: Negative for fever and chills.  Respiratory: Negative for cough and shortness of breath.   Cardiovascular: Negative for chest pain and leg swelling.  Gastrointestinal: Positive for nausea and abdominal pain. Negative for vomiting.  Genitourinary: Negative for dysuria and decreased urine volume.  All other systems reviewed and are negative.    Allergies  Peanut-containing drug products  Home Medications   Current Outpatient Rx  Name  Route  Sig  Dispense  Refill  . citalopram (CELEXA) 20 MG tablet   Oral   Take 20 mg by mouth at bedtime.         . clonazePAM (KLONOPIN) 0.5 MG tablet   Oral   Take 1 tablet (0.5 mg total) by mouth 2 (two) times daily as needed for anxiety.   20 tablet   1   . dicyclomine (BENTYL) 10 MG capsule   Oral   Take 10 mg by mouth 4 (four) times daily -  before meals and at bedtime.         . enalapril (VASOTEC) 10 MG tablet   Oral   Take 10 mg by mouth every evening.          . gabapentin (NEURONTIN) 300 MG  capsule   Oral   Take 300 mg by mouth at bedtime.         . insulin aspart (NOVOLOG) 100 UNIT/ML injection   Subcutaneous   Inject 0-15 Units into the skin 3 (three) times daily before meals. 5 u with each meal + sliding scale: CBG 70 - 120: 0 u: CBG 121 - 150: 2 u; CBG 151 - 200: 3 u; CBG 201 - 250: 5 u; CBG 251 - 300: 8 u;CBG 301 - 350: 11 u; CBG 351 - 400: 15 u; CBG > 400 Call MD         . insulin glargine (LANTUS) 100 UNIT/ML injection   Subcutaneous   Inject 40 Units into the skin daily.          . metoCLOPramide (REGLAN) 10 MG tablet   Oral   Take 1 tablet (10 mg total) by mouth 4 (four) times daily.   120 tablet   0   . omeprazole (PRILOSEC) 40 MG capsule   Oral    Take 40 mg by mouth daily as needed (heart burn).          . cyclobenzaprine (FLEXERIL) 10 MG tablet   Oral   Take 10 mg by mouth at bedtime as needed. For muscle spasms         . promethazine (PHENERGAN) 25 MG tablet   Oral   Take 25 mg by mouth every 6 (six) hours as needed. For nausea           BP 100/49  Pulse 99  Temp(Src) 98 F (36.7 C) (Oral)  Resp 18  Ht 5\' 7"  (1.702 m)  Wt 105 lb (47.628 kg)  BMI 16.44 kg/m2  SpO2 100%  Physical Exam  Nursing note and vitals reviewed. Constitutional: She is oriented to person, place, and time. She appears well-developed and well-nourished.  HENT:  Head: Normocephalic and atraumatic.  Eyes: Pupils are equal, round, and reactive to light.  Neck: Normal range of motion.  Cardiovascular: Regular rhythm.  Tachycardia present.   Pulmonary/Chest: Effort normal and breath sounds normal. No respiratory distress.  Abdominal: Soft. Bowel sounds are normal. She exhibits no distension. There is no tenderness.  Musculoskeletal: Normal range of motion.  Neurological: She is alert and oriented to person, place, and time.  Skin: Skin is warm. There is pallor.    ED Course  Procedures (including critical care time)  Labs Reviewed  URINALYSIS, ROUTINE W REFLEX MICROSCOPIC - Abnormal; Notable for the following:    Specific Gravity, Urine 1.038 (*)    Glucose, UA >1000 (*)    Ketones, ur >80 (*)    All other components within normal limits  LIPASE, BLOOD - Abnormal; Notable for the following:    Lipase 106 (*)    All other components within normal limits  CBC WITH DIFFERENTIAL - Abnormal; Notable for the following:    RBC 3.77 (*)    HCT 34.8 (*)    All other components within normal limits  COMPREHENSIVE METABOLIC PANEL - Abnormal; Notable for the following:    Sodium 128 (*)    Chloride 91 (*)    CO2 10 (*)    Glucose, Bld 630 (*)    Creatinine, Ser 0.35 (*)    Albumin 3.4 (*)    Alkaline Phosphatase 169 (*)    All other  components within normal limits  GLUCOSE, CAPILLARY - Abnormal; Notable for the following:    Glucose-Capillary >600 (*)    All other  components within normal limits  BLOOD GAS, VENOUS - Abnormal; Notable for the following:    pH, Ven 7.233 (*)    pCO2, Ven 25.8 (*)    pO2, Ven 61.0 (*)    Bicarbonate 10.5 (*)    Acid-base deficit 15.5 (*)    All other components within normal limits  GLUCOSE, CAPILLARY - Abnormal; Notable for the following:    Glucose-Capillary 362 (*)    All other components within normal limits  GLUCOSE, CAPILLARY - Abnormal; Notable for the following:    Glucose-Capillary 244 (*)    All other components within normal limits  URINE MICROSCOPIC-ADD ON  POCT PREGNANCY, URINE   No results found.   1. DKA (diabetic ketoacidoses)       MDM  Patient is a noncompliant diabetic, take her insulin.  Yesterday, and was craving carbs so she ate ice cream, pasta and pie  Now with abdominal pain, and nausea.  No vomiting, or diarrhea.       Arman Filter, NP 09/01/12 [phone]

## 2012-09-01 NOTE — H&P (Signed)
PCP:   Salena Saner., MD   Chief Complaint:  High glucose  HPI: 22 yo female type 1 dm since age 69 yo comes in with hyperglycemia due to skipping taking her ssi twice in the last 24 hours.  She is on lantus and at least 8 units ssi per meal.   She did take her lantus yesterday but then skipped her ssi coverage for the majority of the last 24 hours and then ate a bunch of carbs.  She came to the ED because she knew she was going to be in DKA.  She started getting nauseas and had several episodes of vomiting nonbloody.  No recent illnesses.  No diarrhea.  Some abd pain relieved with reglan in ED h/o gastroparesis.  No cp no sob no cough no fevers.  Review of Systems:  Positive and negative as per HPI otherwise all other systems are negative  Past Medical History: Past Medical History  Diagnosis Date  . Diabetes mellitus   . Hypertension   . Diabetes type 1, uncontrolled 11/14/2011    Since age 50  . Gastroparesis    Past Surgical History  Procedure Laterality Date  . Ankle surgery    . Cholecystectomy  11/15/2011    Procedure: LAPAROSCOPIC CHOLECYSTECTOMY WITH INTRAOPERATIVE CHOLANGIOGRAM;  Surgeon: [name], MD;  Location: [facility];  Service: General;  Laterality: N/A;  . Laparoscopy  11/23/2011    Procedure: LAPAROSCOPY DIAGNOSTIC;  Surgeon: Edward Jolly, MD;  Location: [facility];  Service: General;  Laterality: N/A;  . Esophagogastroduodenoscopy  12/03/2011    Procedure: ESOPHAGOGASTRODUODENOSCOPY (EGD);  Surgeon: Beryle Beams, MD;  Location: [facility];  Service: Endoscopy;  Laterality: N/A;    Medications: Prior to Admission medications   Medication Sig Start Date End Date Taking? Authorizing Provider  citalopram (CELEXA) 20 MG tablet Take 20 mg by mouth at bedtime. 04/15/12  Yes Shawnee Knapp, MD  clonazePAM (KLONOPIN) 0.5 MG tablet Take 1 tablet (0.5 mg total) by mouth 2 (two) times daily as needed for anxiety. 04/15/12  Yes Shawnee Knapp, MD  dicyclomine (BENTYL)  10 MG capsule Take 10 mg by mouth 4 (four) times daily -  before meals and at bedtime.   Yes Historical Provider, MD  enalapril (VASOTEC) 10 MG tablet Take 10 mg by mouth every evening.    Yes Historical Provider, MD  gabapentin (NEURONTIN) 300 MG capsule Take 300 mg by mouth at bedtime.   Yes Historical Provider, MD  insulin aspart (NOVOLOG) 100 UNIT/ML injection Inject 0-15 Units into the skin 3 (three) times daily before meals. 5 u with each meal + sliding scale: CBG 70 - 120: 0 u: CBG 121 - 150: 2 u; CBG 151 - 200: 3 u; CBG 201 - 250: 5 u; CBG 251 - 300: 8 u;CBG 301 - 350: 11 u; CBG 351 - 400: 15 u; CBG > 400 Call MD 09/20/11 09/19/12 Yes [name], MD  insulin glargine (LANTUS) 100 UNIT/ML injection Inject 40 Units into the skin daily.    Yes Historical Provider, MD  metoCLOPramide (REGLAN) 10 MG tablet Take 1 tablet (10 mg total) by mouth 4 (four) times daily. 03/16/12  Yes Srikar Janna Arch, MD  omeprazole (PRILOSEC) 40 MG capsule Take 40 mg by mouth daily as needed (heart burn).    Yes Historical Provider, MD  cyclobenzaprine (FLEXERIL) 10 MG tablet Take 10 mg by mouth at bedtime as needed. For muscle spasms    Historical Provider, MD  promethazine (PHENERGAN) 25 MG tablet Take 25 mg by mouth every 6 (six) hours as needed. For nausea    Historical Provider, MD    Allergies:   Allergies  Allergen Reactions  . Peanut-Containing Drug Products Anaphylaxis    [place] nuts specifically      Social History:  reports that she has never smoked. She has never used smokeless tobacco. She reports that  drinks alcohol. She reports that she does not use illicit drugs.  Family History: Family History  Problem Relation Age of Onset  . Diabetes Mother   . Hypertension Father     Physical Exam: Filed Vitals:   09/01/12 0323 09/01/12 0415 09/01/12 0445 09/01/12 0531  BP:    100/49  Pulse:  103 100 99  Temp: 98 F (36.7 C)   98 F (36.7 C)  TempSrc: Oral   Oral  Resp:    18  Height:       Weight:      SpO2:  99% 99% 100%   General appearance: alert, cooperative and no distress  Cachectic and overall chronically ill appearing Head: Normocephalic, without obvious abnormality, atraumatic Eyes: negative Nose: Nares normal. Septum midline. Mucosa normal. No drainage or sinus tenderness. Neck: no JVD and supple, symmetrical, trachea midline Lungs: clear to auscultation bilaterally Heart: regular rate and rhythm, S1, S2 normal, no murmur, click, rub or gallop Abdomen: soft, non-tender; bowel sounds normal; no masses,  no organomegaly Extremities: extremities normal, atraumatic, no cyanosis or edema Pulses: 2+ and symmetric Skin: Skin color, texture, turgor normal. No rashes or lesions Neurologic: Grossly normal    Labs on Admission:   Recent Labs  09/01/12 0217  NA 128*  K 4.0  CL 91*  CO2 10*  GLUCOSE 630*  BUN 9  CREATININE 0.35*  CALCIUM 8.9    Recent Labs  09/01/12 0217  AST 21  ALT 12  ALKPHOS 169*  BILITOT 0.3  PROT 7.0  ALBUMIN 3.4*    Recent Labs  09/01/12 0217  LIPASE 106*    Recent Labs  09/01/12 0217  WBC 5.7  NEUTROABS 2.9  HGB 12.2  HCT 34.8*  MCV 92.3  PLT 250    Radiological Exams on Admission: No results found.  Assessment/Plan  22 yo female DKA due to medical noncompliance  Principal Problem:   DKA, type 1 Active Problems:   Diabetes type 1, uncontrolled   Hypertension associated with diabetes   Gastroparesis   Nausea and vomiting   Noncompliance with diabetes treatment  Mentation normal at this time.  Place in stepdown.  dka pathway.  reglan and dilaudid prn.  Insulin gtt.  Full code.  Khaalid Lefkowitz A 09/01/2012, 6:08 AM

## 2012-09-01 NOTE — Progress Notes (Signed)
Inpatient Diabetes Program Recommendations  AACE/ADA: New Consensus Statement on Inpatient Glycemic Control (2013)  Target Ranges:  Prepandial:   less than 140 mg/dL      Peak postprandial:   less than 180 mg/dL (1-2 hours)      Critically ill patients:  140 - 180 mg/dL   Reason for Visit: DKA  Familiar with pt from previous visits.  Pt states she "just got lazy" with checking her blood sugars and has been stressed with school and other things at home.  Did not take her Novolog, but took her Lantus 28 units.  States she takes Novolog S/S, but it usually averages out to around 18 units at meals.  Has insulin and strips at home, but has not been to MD since last visit.  States she has appt at end of month at [facility].  Results for [name], [name] (MRN [id number]) as of 09/01/2012 16:07  Ref. Range 09/01/2012 14:05  Sodium Latest Range: 135-145 mEq/L 135  Potassium Latest Range: 3.5-5.1 mEq/L 3.9  Chloride Latest Range: 96-112 mEq/L 104  CO2 Latest Range: 19-32 mEq/L 21  BUN Latest Range: 6-23 mg/dL 9  Creatinine Latest Range: 0.50-1.10 mg/dL 4.09 (L)  Calcium Latest Range: 8.4-10.5 mg/dL 8.3 (L)  GFR calc non Af Amer Latest Range: >90 mL/min >90  GFR calc Af Amer Latest Range: >90 mL/min >90  Glucose Latest Range: 70-99 mg/dL 811 (H)  Results for [name], [name] (MRN [id number]) as of 09/01/2012 16:07  Ref. Range 09/01/2012 04:13 09/01/2012 05:27 09/01/2012 06:26 09/01/2012 07:55 09/01/2012 09:11  Glucose-Capillary Latest Range: 70-99 mg/dL 213 (H) 086 (H) 578 (H) 119 (H) 80  Results for [name], [name] (MRN [id number]) as of 09/01/2012 16:07  Ref. Range 02/28/2012 17:40  Hemoglobin A1C Latest Range: <5.7 % 10.8 (H)   DKA secondary to non-compliance with monitoring, diet and rapid-acting insulin.  Recommendations:  Need updated HgbA1C to assess glycemic control prior to hospitalization Please add meal coverage insulin - Novolog 6 units tidwc if pt eats >50% meal. Would benefit from diabetes  support group - will give info and discuss tomorrow am.  Discussed with RN.  Thank you. Ailene Ards, RD, LDN, CDE Inpatient Diabetes Coordinator [phone]

## 2012-09-01 NOTE — Progress Notes (Signed)
Patient admitted earlier today. Patient seen and examined. H&P reviewed.  She states her nausea is better. Denies abdominal pain. No other complaints. States she is usually compliant with her medications and diet.  Vital signs reviewed. Lungs a re CTA Heart: S1S2 normal regular. No murmurs. No rubs Abdomen: Mild tenderness in RUQ and Epig. No rebound rigidity or guarding. BS present. Neuro: Alert and oriented x 3. No focal deficits.  Patient admitted with DKA: Await labs. Protocol not initiated till recently. Will discuss with RN. Patient not on D5 infusion. If parameters are better, she will be transitioned to Lantus and initiated on a diet.  Diabetic Gastroparesis: On Reglan.  Upper Abdominal Pain: LFt normal. She is s/p Cholecystectomy. PPI.  Will continue to follow closely.  Talis Iwan 09/01/2012 8:17 AM  [phone]

## 2012-09-01 NOTE — Care Management Note (Signed)
CARE MANAGEMENT NOTE 09/01/2012  Patient:  [name], [name]   Account Number:  [id number]  Date Initiated:  09/01/2012  Documentation initiated by:  Rawson Minix  Subjective/Objective Assessment:   22 yo female admitted with DKA. PTA pt independent.     Action/Plan:   Home when stable   Anticipated DC Date:     Anticipated DC Plan:  HOME/SELF CARE      DC Planning Services  CM consult      Choice offered to / List presented to:  NA   DME arranged  NA      DME agency  NA     HH arranged  NA      HH agency  NA   Status of service:  In process, will continue to follow Medicare Important Message given?   (If response is "NO", the following Medicare IM given date fields will be blank) Date Medicare IM given:   Date Additional Medicare IM given:    Discharge Disposition:    Per UR Regulation:  Reviewed for med. necessity/level of care/duration of stay  If discussed at Long Length of Stay Meetings, dates discussed:    Comments:  09/01/12 1438 Jontay Maston,RN,BSN [phone] Chart review for utilization of hospital services. No HH services or DME needs identified at this time.

## 2012-09-02 LAB — GLUCOSE, CAPILLARY: Glucose-Capillary: 216 mg/dL — ABNORMAL HIGH (ref 70–99)

## 2012-09-02 LAB — BASIC METABOLIC PANEL
BUN: 7 mg/dL (ref 6–23)
CO2: 26 mEq/L (ref 19–32)
Calcium: 8.5 mg/dL (ref 8.4–10.5)
Chloride: 101 mEq/L (ref 96–112)
Creatinine, Ser: 0.26 mg/dL — ABNORMAL LOW (ref 0.50–1.10)
GFR calc Af Amer: >90 mL/min (ref >90)
GFR calc non Af Amer: >90 mL/min (ref >90)
Glucose, Bld: 277 mg/dL — ABNORMAL HIGH (ref 70–99)
Potassium: 3.1 mEq/L — ABNORMAL LOW (ref 3.5–5.1)
Sodium: 135 mEq/L (ref 135–145)

## 2012-09-02 LAB — CBC
HCT: 30.4 % — ABNORMAL LOW (ref 36.0–46.0)
Hemoglobin: 10.8 g/dL — ABNORMAL LOW (ref 12.0–15.0)
MCH: 32 pg (ref 26.0–34.0)
MCHC: 35.5 g/dL (ref 30.0–36.0)
MCV: 89.9 fL (ref 78.0–100.0)
Platelets: 198 10*3/uL (ref 150–400)
RBC: 3.38 MIL/uL — ABNORMAL LOW (ref 3.87–5.11)
RDW: 12.4 % (ref 11.5–15.5)
WBC: 5.3 10*3/uL (ref 4.0–10.5)

## 2012-09-02 MED ORDER — INSULIN GLARGINE 100 UNIT/ML ~~LOC~~ SOLN
32.0000 [IU] | Freq: Every day | SUBCUTANEOUS | Status: DC
  Filled 2012-09-02: qty 0.32

## 2012-09-02 MED ORDER — ENALAPRIL MALEATE 10 MG PO TABS: 10.0000 mg | ORAL_TABLET | Freq: Every evening | ORAL | Status: DC

## 2012-09-02 MED ORDER — POTASSIUM CHLORIDE CRYS ER 20 MEQ PO TBCR
60.0000 meq | EXTENDED_RELEASE_TABLET | Freq: Once | ORAL | Status: AC
  Administered 2012-09-02: 60 meq via ORAL
  Filled 2012-09-02: qty 3

## 2012-09-02 MED ORDER — INSULIN GLARGINE 100 UNIT/ML ~~LOC~~ SOLN: 32.0000 [IU] | Freq: Every day | SUBCUTANEOUS | Status: DC

## 2012-09-02 NOTE — ED Provider Notes (Signed)
Medical screening examination/treatment/procedure(s) were conducted as a shared visit with non-physician practitioner(s) and myself.  I personally evaluated the patient during the encounter.  23yF with abdominal pain and nausea. Noncompliant with insulin and dietary indiscretion.  DKA with sugar >600, anion gap of 27, bicarb 10, ketonuria. Doubt infectious precipitant.  IVF. Insulin. PRN lyte repletion. Admit.  CRITICAL CARE Performed by: Virgel Manifold Total critical care time: 35 minutes Critical care time was exclusive of separately billable procedures and treating other patients. Critical care was necessary to treat or prevent imminent or life-threatening deterioration. Critical care was time spent personally by me on the following activities: development of treatment plan with patient and/or surrogate as well as nursing, discussions with consultants, evaluation of patient's response to treatment, examination of patient, obtaining history from patient or surrogate, ordering and performing treatments and interventions, ordering and review of laboratory studies, ordering and review of radiographic studies, pulse oximetry and re-evaluation of patient's condition.   Virgel Manifold, MD 09/02/12 [phone]

## 2012-09-02 NOTE — Discharge Summary (Signed)
[facility]  Physician Discharge Summary   Patient ID: [name] MRN: [id number] DOB/AGE: 09-20-1990 22 y.o.  Admit date: 09/01/2012 Discharge date: 09/02/2012  PCP: Salena Saner., MD  DISCHARGE DIAGNOSES:  Active Problems:   Diabetes type 1, uncontrolled   Hypertension associated with diabetes   Gastroparesis   Noncompliance with diabetes treatment   RECOMMENDATIONS FOR OUTPATIENT FOLLOW UP: 1. Patient being discharged on higher dose of Lantus 2. Holding ACEI till follow up.  DISCHARGE CONDITION: fair  Diet recommendation: Carb Mod  Filed Weights   09/01/12 0107 09/01/12 0659  Weight: 47.628 kg (105 lb) 52.6 kg (115 lb 15.4 oz)    INITIAL HISTORY: 22 yo female type 1 dm since age 22 yo comes in with hyperglycemia due to skipping taking her SSI twice in the last 24 hours. She is on lantus and at least 8 units Novolog per meal. She did take her lantus the day prior to admission but then skipped her SSI coverage for the majority of the last 24 hours and then ate a lot of carbs. She came to the ED because she knew she was going to be in DKA. She started getting nauseas and had several episodes of vomiting which were nonbloody. No recent illnesses. No diarrhea. Some abd pain relieved with reglan in ED. She has a h/o gastroparesis. No cp no sob no cough no fevers.  Consultations:  None  Procedures: None  HOSPITAL COURSE:   Diabetic Ketoacidosis She was transitioned off of insulin infusion to Lantus once her anion gap had corrected. She was started on a diet. She tolerated her diet well. She has been up and walking. Her HBA1c was 11.9 implying poor control. She has been asked to take a higher dose of Lantus and follow up with her PCP next week. Her potassium is low this morning and it will be repleted.   DM1, uncontrolled See above. Patient mentioned that her PCP is considering referring her to an Endocrinologist. She has been asked to be compliant with her  medication regimen as well as her diet.  Diabetic Gastroparesis She was started on Reglan and her nausea and vomiting has subsided. She is tolerating her diet.   Borderline Low BP Most likely due to dehydration. This has improved with IVF. She will be asked to hold her ACEI till she sees her PCP.  Upper Abdominal Pain This has resolved and was most likely secondary to the episodes of vomiting. LFT's were normal. She is s/p Cholecystectomy. She was given PPI with good effect.  Overall patient is improved. Her DKA has resolved and she is tolerating her Lantus well. She is tolerating her diet well as well. She is stable for discharge   PERTINENT LABS:  The results of significant diagnostics from this hospitalization (including imaging, microbiology, ancillary and laboratory) are listed below for reference.    Microbiology: Recent Results (from the past 240 hour(s))  MRSA PCR SCREENING     Status: None   Collection Time    09/01/12  7:54 AM      Result Value Range Status   MRSA by PCR NEGATIVE  NEGATIVE Final   Comment:            The GeneXpert MRSA Assay (FDA     approved for NASAL specimens     only), is one component of a     comprehensive MRSA colonization     surveillance program. It is not     intended to diagnose MRSA  infection nor to guide or     monitor treatment for     MRSA infections.     Labs: Basic Metabolic Panel:  Recent Labs Lab 09/01/12 0217 09/01/12 0820 09/01/12 1405 09/02/12 0335  NA 128* 138 135 135  K 4.0 3.0* 3.9 3.1*  CL 91* 107 104 101  CO2 10* 20 21 26   GLUCOSE 630* 98 282* 277*  BUN 9 8 9 7   CREATININE 0.35* 0.32* 0.32* 0.26*  CALCIUM 8.9 8.3* 8.3* 8.5   Liver Function Tests:  Recent Labs Lab 09/01/12 0217  AST 21  ALT 12  ALKPHOS 169*  BILITOT 0.3  PROT 7.0  ALBUMIN 3.4*    Recent Labs Lab 09/01/12 0217  LIPASE 106*   CBC:  Recent Labs Lab 09/01/12 0217 09/01/12 0830 09/02/12 0335  WBC 5.7 6.0 5.3  NEUTROABS  2.9  --   --   HGB 12.2 10.8* 10.8*  HCT 34.8* 30.3* 30.4*  MCV 92.3 91.3 89.9  PLT 250 208 198   BNP: BNP (last 3 results)  Recent Labs  11/14/11 1605  PROBNP 20.0   CBG:  Recent Labs Lab 09/01/12 0755 09/01/12 0911 09/01/12 1021 09/01/12 1630 09/01/12 2227  GLUCAP 119* 80 154* 224* 326*     IMAGING STUDIES None  DISCHARGE EXAMINATION: Filed Vitals:   09/01/12 2200 09/02/12 0000 09/02/12 0200 09/02/12 0400  BP: 102/67  110/64 101/63  Pulse: 90  91 88  Temp:  98.3 F (36.8 C)    TempSrc:  Oral    Resp: 20  16 11   Height:      Weight:      SpO2: 100%  99% 100%   General appearance: alert, cooperative, appears stated age and no distress Resp: clear to auscultation bilaterally Cardio: regular rate and rhythm, S1, S2 normal, no murmur, click, rub or gallop GI: soft, non-tender; bowel sounds normal; no masses,  no organomegaly Neurologic: Alert and oriented X 3, normal strength and tone. Normal symmetric reflexes. Normal coordination and gait  DISPOSITION: Home  Discharge Orders   Future Orders Complete By Expires     Call MD for:  extreme fatigue  As directed     Call MD for:  persistant nausea and vomiting  As directed     Call MD for:  As directed     Comments:      High blood sugar (greater than 300)    Diet Carb Modified  As directed     Discharge instructions  As directed     Comments:      Please follow up with your doctor next week. Eat a banana every day for next 7 days.    Increase activity slowly  As directed        ALLERGIES:  Allergies  Allergen Reactions  . Peanut-Containing Drug Products Anaphylaxis    [place] nuts specifically      Current Discharge Medication List    CONTINUE these medications which have CHANGED   Details  enalapril (VASOTEC) 10 MG tablet Take 1 tablet (10 mg total) by mouth every evening. START TAKING ONLY AFTER YOU HAVE BEEN SEEN BY YOUR DOCTOR    insulin glargine (LANTUS) 100 UNIT/ML injection Inject 0.32  mLs (32 Units total) into the skin daily. Qty: 10 mL, Refills: 12      CONTINUE these medications which have NOT CHANGED   Details  citalopram (CELEXA) 20 MG tablet Take 20 mg by mouth at bedtime.    clonazePAM (KLONOPIN) 0.5 MG  tablet Take 1 tablet (0.5 mg total) by mouth 2 (two) times daily as needed for anxiety. Qty: 20 tablet, Refills: 1   Associated Diagnoses: Depression with anxiety    dicyclomine (BENTYL) 10 MG capsule Take 10 mg by mouth 4 (four) times daily -  before meals and at bedtime.    gabapentin (NEURONTIN) 300 MG capsule Take 300 mg by mouth at bedtime.    insulin aspart (NOVOLOG) 100 UNIT/ML injection Inject 0-15 Units into the skin 3 (three) times daily before meals. 5 u with each meal + sliding scale: CBG 70 - 120: 0 u: CBG 121 - 150: 2 u; CBG 151 - 200: 3 u; CBG 201 - 250: 5 u; CBG 251 - 300: 8 u;CBG 301 - 350: 11 u; CBG 351 - 400: 15 u; CBG > 400 Call MD    metoCLOPramide (REGLAN) 10 MG tablet Take 1 tablet (10 mg total) by mouth 4 (four) times daily. Qty: 120 tablet, Refills: 0    omeprazole (PRILOSEC) 40 MG capsule Take 40 mg by mouth daily as needed (heart burn).     cyclobenzaprine (FLEXERIL) 10 MG tablet Take 10 mg by mouth at bedtime as needed. For muscle spasms    promethazine (PHENERGAN) 25 MG tablet Take 25 mg by mouth every 6 (six) hours as needed. For nausea       Follow-up Information   Follow up with Salena Saner., MD. Schedule an appointment as soon as possible for a visit in 1 week.   Contact information:   [address] [place] [address] [phone]       TOTAL DISCHARGE TIME: 35 mins  [facility] Pager [phone]  09/02/2012, 7:25 AM

## 2012-11-10 ENCOUNTER — Other Ambulatory Visit: Payer: Self-pay | Admitting: Family Medicine

## 2012-11-10 NOTE — Telephone Encounter (Signed)
Forward to Dr. Shaw 

## 2012-12-28 ENCOUNTER — Encounter (HOSPITAL_COMMUNITY): Payer: Self-pay | Admitting: Emergency Medicine

## 2012-12-28 ENCOUNTER — Emergency Department (HOSPITAL_COMMUNITY)
Admission: EM | Admit: 2012-12-28 | Discharge: 2012-12-29 | Disposition: A | Discharge status: Home / Self Care | Payer: 59 | Attending: Emergency Medicine | Admitting: Emergency Medicine

## 2012-12-28 DIAGNOSIS — Z8719 Personal history of other diseases of the digestive system: Secondary | ICD-10-CM | POA: Insufficient documentation

## 2012-12-28 DIAGNOSIS — M549 Dorsalgia, unspecified: Secondary | ICD-10-CM | POA: Insufficient documentation

## 2012-12-28 DIAGNOSIS — Z794 Long term (current) use of insulin: Secondary | ICD-10-CM | POA: Insufficient documentation

## 2012-12-28 DIAGNOSIS — Z79899 Other long term (current) drug therapy: Secondary | ICD-10-CM | POA: Insufficient documentation

## 2012-12-28 DIAGNOSIS — I10 Essential (primary) hypertension: Secondary | ICD-10-CM | POA: Insufficient documentation

## 2012-12-28 DIAGNOSIS — R300 Dysuria: Secondary | ICD-10-CM | POA: Insufficient documentation

## 2012-12-28 DIAGNOSIS — R000 Tachycardia, unspecified: Secondary | ICD-10-CM | POA: Insufficient documentation

## 2012-12-28 DIAGNOSIS — E1065 Type 1 diabetes mellitus with hyperglycemia: Secondary | ICD-10-CM | POA: Insufficient documentation

## 2012-12-28 DIAGNOSIS — R739 Hyperglycemia, unspecified: Secondary | ICD-10-CM

## 2012-12-28 DIAGNOSIS — IMO0002 Reserved for concepts with insufficient information to code with codable children: Secondary | ICD-10-CM | POA: Insufficient documentation

## 2012-12-28 DIAGNOSIS — Z3202 Encounter for pregnancy test, result negative: Secondary | ICD-10-CM | POA: Insufficient documentation

## 2012-12-28 LAB — BASIC METABOLIC PANEL
BUN: 10 mg/dL (ref 6–23)
CO2: 27 mEq/L (ref 19–32)
Calcium: 9.4 mg/dL (ref 8.4–10.5)
Chloride: 88 mEq/L — ABNORMAL LOW (ref 96–112)
Creatinine, Ser: 0.35 mg/dL — ABNORMAL LOW (ref 0.50–1.10)
GFR calc Af Amer: >90 mL/min (ref >90)
GFR calc non Af Amer: >90 mL/min (ref >90)
Glucose, Bld: 531 mg/dL — ABNORMAL HIGH (ref 70–99)
Potassium: 4.6 mEq/L (ref 3.5–5.1)
Sodium: 129 mEq/L — ABNORMAL LOW (ref 135–145)

## 2012-12-28 LAB — CBC WITH DIFFERENTIAL/PLATELET
Basophils Absolute: 0 10*3/uL (ref 0.0–0.1)
Basophils Relative: 1 % (ref 0–1)
Eosinophils Absolute: 0 10*3/uL (ref 0.0–0.7)
Eosinophils Relative: 1 % (ref 0–5)
HCT: 39.9 % (ref 36.0–46.0)
Hemoglobin: 14.7 g/dL (ref 12.0–15.0)
Lymphocytes Relative: 43 % (ref 12–46)
Lymphs Abs: 1.9 10*3/uL (ref 0.7–4.0)
MCH: 31.1 pg (ref 26.0–34.0)
MCHC: 36.8 g/dL — ABNORMAL HIGH (ref 30.0–36.0)
MCV: 84.4 fL (ref 78.0–100.0)
Monocytes Absolute: 0.3 10*3/uL (ref 0.1–1.0)
Monocytes Relative: 6 % (ref 3–12)
Neutro Abs: 2.2 10*3/uL (ref 1.7–7.7)
Neutrophils Relative %: 50 % (ref 43–77)
Platelets: 209 10*3/uL (ref 150–400)
RBC: 4.73 MIL/uL (ref 3.87–5.11)
RDW: 11.1 % — ABNORMAL LOW (ref 11.5–15.5)
WBC: 4.4 10*3/uL (ref 4.0–10.5)

## 2012-12-28 LAB — URINALYSIS, ROUTINE W REFLEX MICROSCOPIC
Bilirubin Urine: NEGATIVE
Glucose, UA: >1000 mg/dL — AB
Hgb urine dipstick: NEGATIVE
Ketones, ur: 40 mg/dL — AB
Leukocytes, UA: NEGATIVE
Nitrite: NEGATIVE
Protein, ur: NEGATIVE mg/dL
Specific Gravity, Urine: 1.044 — ABNORMAL HIGH (ref 1.005–1.030)
Urobilinogen, UA: 0.2 mg/dL (ref 0.0–1.0)
pH: 5.5 (ref 5.0–8.0)

## 2012-12-28 LAB — GLUCOSE, CAPILLARY
Glucose-Capillary: 584 mg/dL (ref 70–99)
Glucose-Capillary: 391 mg/dL — ABNORMAL HIGH (ref 70–99)
Glucose-Capillary: 292 mg/dL — ABNORMAL HIGH (ref 70–99)

## 2012-12-28 LAB — BLOOD GAS, VENOUS
Acid-base deficit: 0.1 mmol/L (ref 0.0–2.0)
Bicarbonate: 25.4 mEq/L — ABNORMAL HIGH (ref 20.0–24.0)
FIO2: 0.21 %
O2 Saturation: 54.7 %
Patient temperature: 98.6
TCO2: 23 mmol/L (ref 0–100)
pCO2, Ven: 46.9 mmHg (ref 45.0–50.0)
pH, Ven: 7.353 — ABNORMAL HIGH (ref 7.250–7.300)
pO2, Ven: 0 mmHg — CL (ref 30.0–45.0)

## 2012-12-28 LAB — URINE MICROSCOPIC-ADD ON: Urine-Other: NONE SEEN

## 2012-12-28 LAB — POCT PREGNANCY, URINE: Preg Test, Ur: NEGATIVE

## 2012-12-28 MED ORDER — INSULIN ASPART 100 UNIT/ML ~~LOC~~ SOLN
10.0000 [IU] | Freq: Once | SUBCUTANEOUS | Status: AC
  Administered 2012-12-28: 10 [IU] via INTRAVENOUS

## 2012-12-28 MED ORDER — SODIUM CHLORIDE 0.9 % IV SOLN
1000.0000 mL | Freq: Once | INTRAVENOUS | Status: AC
Start: 2012-12-28 — End: 2012-12-28
  Administered 2012-12-28: 1000 mL via INTRAVENOUS

## 2012-12-28 MED ORDER — INSULIN GLARGINE 100 UNIT/ML ~~LOC~~ SOLN
40.0000 [IU] | Freq: Once | SUBCUTANEOUS | Status: AC
  Administered 2012-12-28: 40 [IU] via SUBCUTANEOUS
  Filled 2012-12-28: qty 0.4

## 2012-12-28 MED ORDER — INSULIN ASPART 100 UNIT/ML ~~LOC~~ SOLN
SUBCUTANEOUS | Status: AC
  Filled 2012-12-28: qty 1

## 2012-12-28 MED ORDER — SODIUM CHLORIDE 0.9 % IV SOLN
1000.0000 mL | INTRAVENOUS | Status: DC
  Administered 2012-12-28: 1000 mL via INTRAVENOUS

## 2012-12-28 MED ORDER — SODIUM CHLORIDE 0.9 % IV SOLN
1000.0000 mL | Freq: Once | INTRAVENOUS | Status: AC
  Administered 2012-12-28: 1000 mL via INTRAVENOUS

## 2012-12-28 NOTE — ED Notes (Signed)
PT was seen at urgent, BG was checked and was told to come to ED. BG was over 500. Pt also states she has lower back pain.

## 2012-12-28 NOTE — ED Provider Notes (Signed)
CSN: [id number]     Arrival date & time 12/28/12  1852 History   First MD Initiated Contact with Patient 12/28/12 1919     Chief Complaint  Patient presents with  . Hyperglycemia   (Consider location/radiation/quality/duration/timing/severity/associated sxs/prior Treatment) HPI  This is a 22 year old female who presents with hyperglycemia. The patient reports that she last took her insulin yesterday morning at 2 AM. She states that she's been having stress and isn't good with her insulin when she is under stress.  She reports last being in DKA back in July. Patient also reports back pain. She states that she was seen in urgent care and was told that she had a urinary tract infection. She has a prescription for amoxicillin but she has not taken it. She denies any fevers, shortness breath, chest pain, abdominal pain, focal weakness or numbness.  Past Medical History  Diagnosis Date  . Diabetes mellitus   . Hypertension   . Diabetes type 1, uncontrolled 11/14/2011    Since age 27  . Gastroparesis    Past Surgical History  Procedure Laterality Date  . Ankle surgery    . Cholecystectomy  11/15/2011    Procedure: LAPAROSCOPIC CHOLECYSTECTOMY WITH INTRAOPERATIVE CHOLANGIOGRAM;  Surgeon: [name], MD;  Location: [facility];  Service: General;  Laterality: N/A;  . Laparoscopy  11/23/2011    Procedure: LAPAROSCOPY DIAGNOSTIC;  Surgeon: Mariella Saa, MD;  Location: [facility];  Service: General;  Laterality: N/A;  . Esophagogastroduodenoscopy  12/03/2011    Procedure: ESOPHAGOGASTRODUODENOSCOPY (EGD);  Surgeon: Theda Belfast, MD;  Location: [facility];  Service: Endoscopy;  Laterality: N/A;   Family History  Problem Relation Age of Onset  . Diabetes Mother   . Hypertension Father    History  Substance Use Topics  . Smoking status: Never Smoker   . Smokeless tobacco: Never Used  . Alcohol Use: Yes     Comment: Occasional   OB History   Grav Para Term Preterm Abortions TAB SAB Ect  Mult Living                 Review of Systems  Constitutional: Negative for fever.  HENT: Negative for sinus pressure.   Respiratory: Negative for cough, chest tightness and shortness of breath.   Cardiovascular: Negative for chest pain.  Gastrointestinal: Negative for nausea, vomiting and abdominal pain.  Genitourinary: Positive for dysuria.  Musculoskeletal: Positive for back pain.  Skin: Negative for wound.  Neurological: Negative for headaches.  Psychiatric/Behavioral: Negative for confusion.  All other systems reviewed and are negative.    Allergies  Peanut-containing drug products  Home Medications   Current Outpatient Rx  Name  Route  Sig  Dispense  Refill  . bismuth subsalicylate (KAOPECTATE) 262 MG/15ML suspension   Oral   Take 15 mLs by mouth every 6 (six) hours as needed (PRN diarrhea).         . citalopram (CELEXA) 20 MG tablet   Oral   Take 20 mg by mouth at bedtime.         . dicyclomine (BENTYL) 10 MG capsule   Oral   Take 10 mg by mouth 4 (four) times daily -  before meals and at bedtime.         . enalapril (VASOTEC) 10 MG tablet   Oral   Take 1 tablet (10 mg total) by mouth every evening. START TAKING ONLY AFTER YOU HAVE BEEN SEEN BY YOUR DOCTOR         .  gabapentin (NEURONTIN) 300 MG capsule   Oral   Take 300 mg by mouth at bedtime.         . insulin glargine (LANTUS) 100 UNIT/ML injection   Subcutaneous   Inject 40 Units into the skin at bedtime.         . insulin lispro (HUMALOG) 100 UNIT/ML injection   Subcutaneous   Inject 0-15 Units into the skin 4 (four) times daily -  before meals and at bedtime. sliding scale: CBG 70 - 120: 0 uunits: CBG 121 - 150: 2 units; CBG 151 - 200: 3 units; CBG 201 - 250: 5 uunits; CBG 251 - 300: 8 units;CBG 301 - 350: 11 units; CBG 351 - 400: 15 units; CBG > 400 Call MD         . insulin lispro (HUMALOG) 100 UNIT/ML injection   Subcutaneous   Inject 5 Units into the skin 3 (three) times daily  before meals.         . metoCLOPramide (REGLAN) 10 MG tablet   Oral   Take 1 tablet (10 mg total) by mouth 4 (four) times daily.   120 tablet   0   . promethazine (PHENERGAN) 25 MG tablet   Oral   Take 25 mg by mouth every 6 (six) hours as needed. For nausea          BP 122/88  Pulse 118  Temp(Src) 98.1 F (36.7 C) (Oral)  Resp 18  SpO2 99% Physical Exam  Nursing note and vitals reviewed. Constitutional: She is oriented to person, place, and time. She appears well-developed and well-nourished. No distress.  HENT:  Head: Normocephalic and atraumatic.  Mucous membranes dry  Eyes: Pupils are equal, round, and reactive to light.  Cardiovascular: Regular rhythm and normal heart sounds.   Tachycardia  Pulmonary/Chest: Effort normal and breath sounds normal. No respiratory distress. She has no wheezes.  Abdominal: Soft. Bowel sounds are normal. There is no tenderness. There is no rebound.  Genitourinary:  No CVA tenderness  Neurological: She is alert and oriented to person, place, and time.  Skin: Skin is warm and dry.  Psychiatric: She has a normal mood and affect.    ED Course  Procedures (including critical care time) Labs Review Labs Reviewed  GLUCOSE, CAPILLARY - Abnormal; Notable for the following:    Glucose-Capillary 584 (*)    All other components within normal limits  BASIC METABOLIC PANEL - Abnormal; Notable for the following:    Sodium 129 (*)    Chloride 88 (*)    Glucose, Bld 531 (*)    Creatinine, Ser 0.35 (*)    All other components within normal limits  CBC WITH DIFFERENTIAL - Abnormal; Notable for the following:    MCHC 36.8 (*)    RDW 11.1 (*)    All other components within normal limits  URINALYSIS, ROUTINE W REFLEX MICROSCOPIC - Abnormal; Notable for the following:    Specific Gravity, Urine 1.044 (*)    Glucose, UA >1000 (*)    Ketones, ur 40 (*)    All other components within normal limits  BLOOD GAS, VENOUS - Abnormal; Notable for the  following:    pH, Ven 7.353 (*)    pO2, Ven 0.0 (*)    Bicarbonate 25.4 (*)    All other components within normal limits  GLUCOSE, CAPILLARY - Abnormal; Notable for the following:    Glucose-Capillary 391 (*)    All other components within normal limits  GLUCOSE, CAPILLARY -  Abnormal; Notable for the following:    Glucose-Capillary 292 (*)    All other components within normal limits  URINE MICROSCOPIC-ADD ON  POCT PREGNANCY, URINE   Imaging Review No results found.  MDM   1. Hyperglycemia     Patient presents with hyperglycemia. Initial vital signs are notable for tachycardia. She is nontoxic-appearing on exam. Lab work is notable for a venous pH of 7.35. BMP shows a glucose of 531 and a gap of 14.  Patient was given 2 L of fluid and 10 of IV insulin. Following the initial fluid administration her glucose dropped to 391. The 10 of IV insulin was given following that reading.  Patient was also given her nightly dose of Lantus. She is in no acute distress and her labs are reassuring. I feel with fluid administration and insulin, patient may be able to be discharged home. At this time she does not need an insulin infusion.  Hyperglycemia is likely secondary to noncompliance.  Following 2 full liters of fluid, 10 of IV insulin, and 40 of nightly Lantus, patient's blood glucose is now 292.  Patient's pulse is within normal range. Patient has been swollen at home. I encouraged her to take her blood glucose levels frequently over the next 12 hours. She stated understanding.  She had no gap and was not acidotic.  After history, exam, and medical workup I feel the patient has been appropriately medically screened and is safe for discharge home. Pertinent diagnoses were discussed with the patient. Patient was given return precautions.     Shon Baton, MD 12/29/12 [phone]

## 2012-12-29 NOTE — Discharge Instructions (Signed)

## 2012-12-29 NOTE — ED Notes (Signed)
Patient is alert and oriented x3.  She was given DC instructions and follow up visit instructions.  Patient gave verbal understanding. She was DC ambulatory under her own power to home.  V/S stable.  He was not showing any signs of distress on DC 

## 2013-01-25 ENCOUNTER — Other Ambulatory Visit: Payer: Self-pay | Admitting: Diagnostic Neuroimaging

## 2013-01-28 NOTE — Telephone Encounter (Signed)
LAST SEEN ONE YEAR AGO, NO SHOWED LAST APPT

## 2013-02-08 ENCOUNTER — Other Ambulatory Visit: Payer: Self-pay | Admitting: Diagnostic Neuroimaging

## 2013-02-10 ENCOUNTER — Other Ambulatory Visit: Payer: Self-pay | Admitting: Internal Medicine

## 2013-02-16 ENCOUNTER — Other Ambulatory Visit: Payer: Self-pay | Admitting: Internal Medicine

## 2013-02-25 ENCOUNTER — Other Ambulatory Visit: Payer: Self-pay | Admitting: Diagnostic Neuroimaging

## 2013-03-15 ENCOUNTER — Telehealth: Payer: Self-pay | Admitting: Diagnostic Neuroimaging

## 2013-03-15 NOTE — Telephone Encounter (Signed)
Patient called wanting to get an appointment with Dr Leta Baptist sooner than her scheduled 05/12/13 appointment because she is in a lot of pain. Patient states she is diabetic and has diabetic neurological pain and has been experiencing it for a month now. Please call the patient, and she has given permission to leave a detailed voicemail message.

## 2013-03-15 NOTE — Telephone Encounter (Signed)
Patient has been sched w/ [facility], 03/18/13/confirmed

## 2013-03-16 ENCOUNTER — Emergency Department (HOSPITAL_COMMUNITY)
Admission: EM | Admit: 2013-03-16 | Discharge: 2013-03-17 | Disposition: A | Discharge status: Home / Self Care | Payer: 59 | Attending: Emergency Medicine | Admitting: Emergency Medicine

## 2013-03-16 ENCOUNTER — Encounter (HOSPITAL_COMMUNITY): Payer: Self-pay | Admitting: Emergency Medicine

## 2013-03-16 DIAGNOSIS — Z8719 Personal history of other diseases of the digestive system: Secondary | ICD-10-CM | POA: Insufficient documentation

## 2013-03-16 DIAGNOSIS — Z3202 Encounter for pregnancy test, result negative: Secondary | ICD-10-CM | POA: Insufficient documentation

## 2013-03-16 DIAGNOSIS — Z9889 Other specified postprocedural states: Secondary | ICD-10-CM | POA: Insufficient documentation

## 2013-03-16 DIAGNOSIS — Z79899 Other long term (current) drug therapy: Secondary | ICD-10-CM | POA: Insufficient documentation

## 2013-03-16 DIAGNOSIS — M25569 Pain in unspecified knee: Secondary | ICD-10-CM | POA: Insufficient documentation

## 2013-03-16 DIAGNOSIS — R739 Hyperglycemia, unspecified: Secondary | ICD-10-CM

## 2013-03-16 DIAGNOSIS — IMO0002 Reserved for concepts with insufficient information to code with codable children: Secondary | ICD-10-CM | POA: Insufficient documentation

## 2013-03-16 DIAGNOSIS — E1065 Type 1 diabetes mellitus with hyperglycemia: Secondary | ICD-10-CM | POA: Insufficient documentation

## 2013-03-16 DIAGNOSIS — I10 Essential (primary) hypertension: Secondary | ICD-10-CM | POA: Insufficient documentation

## 2013-03-16 DIAGNOSIS — Z794 Long term (current) use of insulin: Secondary | ICD-10-CM | POA: Insufficient documentation

## 2013-03-16 NOTE — ED Notes (Signed)
Pt c/o sharp stabbing pains to various parts of her body starting in feet intermittent x 2 weeks. Pain worse last 2 hours. Emesis yesterday. Denies fever.

## 2013-03-17 ENCOUNTER — Encounter (HOSPITAL_COMMUNITY): Payer: Self-pay | Admitting: Emergency Medicine

## 2013-03-17 ENCOUNTER — Emergency Department (HOSPITAL_COMMUNITY)
Admission: EM | Admit: 2013-03-17 | Discharge: 2013-03-17 | Disposition: A | Discharge status: Home / Self Care | Payer: 59 | Attending: Emergency Medicine | Admitting: Emergency Medicine

## 2013-03-17 DIAGNOSIS — E1065 Type 1 diabetes mellitus with hyperglycemia: Secondary | ICD-10-CM | POA: Insufficient documentation

## 2013-03-17 DIAGNOSIS — IMO0002 Reserved for concepts with insufficient information to code with codable children: Secondary | ICD-10-CM | POA: Insufficient documentation

## 2013-03-17 DIAGNOSIS — R0789 Other chest pain: Secondary | ICD-10-CM | POA: Insufficient documentation

## 2013-03-17 DIAGNOSIS — Z8719 Personal history of other diseases of the digestive system: Secondary | ICD-10-CM | POA: Insufficient documentation

## 2013-03-17 DIAGNOSIS — M2550 Pain in unspecified joint: Secondary | ICD-10-CM

## 2013-03-17 DIAGNOSIS — R000 Tachycardia, unspecified: Secondary | ICD-10-CM | POA: Insufficient documentation

## 2013-03-17 DIAGNOSIS — R52 Pain, unspecified: Secondary | ICD-10-CM | POA: Insufficient documentation

## 2013-03-17 DIAGNOSIS — I10 Essential (primary) hypertension: Secondary | ICD-10-CM | POA: Insufficient documentation

## 2013-03-17 DIAGNOSIS — M25569 Pain in unspecified knee: Secondary | ICD-10-CM | POA: Insufficient documentation

## 2013-03-17 DIAGNOSIS — M25529 Pain in unspecified elbow: Secondary | ICD-10-CM | POA: Insufficient documentation

## 2013-03-17 DIAGNOSIS — Z79899 Other long term (current) drug therapy: Secondary | ICD-10-CM | POA: Insufficient documentation

## 2013-03-17 DIAGNOSIS — Z794 Long term (current) use of insulin: Secondary | ICD-10-CM | POA: Insufficient documentation

## 2013-03-17 LAB — CBC
HCT: 35.4 % — ABNORMAL LOW (ref 36.0–46.0)
HCT: 36.2 % (ref 36.0–46.0)
Hemoglobin: 12.4 g/dL (ref 12.0–15.0)
Hemoglobin: 13 g/dL (ref 12.0–15.0)
MCH: 30.2 pg (ref 26.0–34.0)
MCH: 30.8 pg (ref 26.0–34.0)
MCHC: 35 g/dL (ref 30.0–36.0)
MCHC: 35.9 g/dL (ref 30.0–36.0)
MCV: 86.1 fL (ref 78.0–100.0)
MCV: 85.8 fL (ref 78.0–100.0)
Platelets: 211 10*3/uL (ref 150–400)
Platelets: 229 10*3/uL (ref 150–400)
RBC: 4.11 MIL/uL (ref 3.87–5.11)
RBC: 4.22 MIL/uL (ref 3.87–5.11)
RDW: 11.4 % — ABNORMAL LOW (ref 11.5–15.5)
RDW: 11.2 % — ABNORMAL LOW (ref 11.5–15.5)
WBC: 5.3 10*3/uL (ref 4.0–10.5)
WBC: 4.8 10*3/uL (ref 4.0–10.5)

## 2013-03-17 LAB — BASIC METABOLIC PANEL
BUN: 16 mg/dL (ref 6–23)
CO2: 24 mEq/L (ref 19–32)
Calcium: 9.3 mg/dL (ref 8.4–10.5)
Chloride: 100 mEq/L (ref 96–112)
Creatinine, Ser: 0.35 mg/dL — ABNORMAL LOW (ref 0.50–1.10)
GFR calc Af Amer: >90 mL/min (ref >90)
GFR calc non Af Amer: >90 mL/min (ref >90)
Glucose, Bld: 150 mg/dL — ABNORMAL HIGH (ref 70–99)
Potassium: 3.9 mEq/L (ref 3.5–5.1)
Sodium: 136 mEq/L (ref 135–145)

## 2013-03-17 LAB — POCT I-STAT, CHEM 8
BUN: 14 mg/dL (ref 6–23)
Calcium, Ion: 1.26 mmol/L — ABNORMAL HIGH (ref 1.12–1.23)
Chloride: 101 mEq/L (ref 96–112)
Creatinine, Ser: 0.5 mg/dL (ref 0.50–1.10)
Glucose, Bld: 78 mg/dL (ref 70–99)
HCT: 37 % (ref 36.0–46.0)
Hemoglobin: 12.6 g/dL (ref 12.0–15.0)
Potassium: 3.8 mEq/L (ref 3.5–5.1)
Sodium: 142 mEq/L (ref 135–145)
TCO2: 28 mmol/L (ref 0–100)

## 2013-03-17 LAB — POCT PREGNANCY, URINE: Preg Test, Ur: NEGATIVE

## 2013-03-17 LAB — GLUCOSE, CAPILLARY: Glucose-Capillary: 183 mg/dL — ABNORMAL HIGH (ref 70–99)

## 2013-03-17 LAB — CK: Total CK: 61 U/L (ref 7–177)

## 2013-03-17 MED ORDER — SODIUM CHLORIDE 0.9 % IV BOLUS (SEPSIS)
1000.0000 mL | Freq: Once | INTRAVENOUS | Status: AC
  Administered 2013-03-17: 1000 mL via INTRAVENOUS

## 2013-03-17 MED ORDER — OXYCODONE-ACETAMINOPHEN 5-325 MG PO TABS: 1.0000 | ORAL_TABLET | Freq: Four times a day (QID) | ORAL | Status: DC | PRN

## 2013-03-17 MED ORDER — MORPHINE SULFATE 4 MG/ML IJ SOLN: 6.0000 mg | Freq: Once | INTRAMUSCULAR | Status: DC

## 2013-03-17 MED ORDER — OXYCODONE-ACETAMINOPHEN 5-325 MG PO TABS
2.0000 | ORAL_TABLET | Freq: Once | ORAL | Status: AC
  Administered 2013-03-17: 2 via ORAL
  Filled 2013-03-17: qty 2

## 2013-03-17 MED ORDER — HYDROMORPHONE HCL PF 1 MG/ML IJ SOLN
1.0000 mg | Freq: Once | INTRAMUSCULAR | Status: AC
  Administered 2013-03-17: 1 mg via INTRAVENOUS
  Filled 2013-03-17: qty 1

## 2013-03-17 MED ORDER — HYDROMORPHONE HCL PF 1 MG/ML IJ SOLN: 1.0000 mg | Freq: Once | INTRAMUSCULAR | Status: DC

## 2013-03-17 MED ORDER — DEXAMETHASONE SODIUM PHOSPHATE 10 MG/ML IJ SOLN
10.0000 mg | Freq: Once | INTRAMUSCULAR | Status: AC
  Administered 2013-03-17: 10 mg via INTRAMUSCULAR
  Filled 2013-03-17: qty 1

## 2013-03-17 NOTE — ED Provider Notes (Signed)
CSN: [id number]     Arrival date & time 03/16/13  2340 History   First MD Initiated Contact with Patient 03/17/13 0057     Chief Complaint  Patient presents with  . Generalized Body Aches   (Consider location/radiation/quality/duration/timing/severity/associated sxs/prior Treatment) HPI Comments: Patient is a 22 year old female with a history of diabetes and neuropathy who presents today for pain in her bilateral lower extremities and ankles. Patient states that she has had some degree of this pain for last 1.5 years, but it has been worsening over the last 2 hours. She describes the pain as sharp and stabbing in nature and without alleviating factors. Patient has tried Aleve and ice packs for her symptoms without relief. She states she is on Gabapentin for management of the chronic nature of this pain. She has f/u tomorrow with her neurologist today at 10AM. Patient denies associated trauma/injury, fever, CP, SOB, N/V/D, dysuria, hematuria, numbness/tingling, and weakness.  The history is provided by the patient. No language interpreter was used.    Past Medical History  Diagnosis Date  . Diabetes mellitus   . Hypertension   . Diabetes type 1, uncontrolled 11/14/2011    Since age 47  . Gastroparesis    Past Surgical History  Procedure Laterality Date  . Ankle surgery    . Cholecystectomy  11/15/2011    Procedure: LAPAROSCOPIC CHOLECYSTECTOMY WITH INTRAOPERATIVE CHOLANGIOGRAM;  Surgeon: [name], MD;  Location: [facility];  Service: General;  Laterality: N/A;  . Laparoscopy  11/23/2011    Procedure: LAPAROSCOPY DIAGNOSTIC;  Surgeon: Mariella Saa, MD;  Location: [facility];  Service: General;  Laterality: N/A;  . Esophagogastroduodenoscopy  12/03/2011    Procedure: ESOPHAGOGASTRODUODENOSCOPY (EGD);  Surgeon: Theda Belfast, MD;  Location: [facility];  Service: Endoscopy;  Laterality: N/A;   Family History  Problem Relation Age of Onset  . Diabetes Mother   . Hypertension Father     History  Substance Use Topics  . Smoking status: Never Smoker   . Smokeless tobacco: Never Used  . Alcohol Use: Yes     Comment: Occasional   OB History   Grav Para Term Preterm Abortions TAB SAB Ect Mult Living                 Review of Systems  Constitutional: Negative for fever.  Respiratory: Negative for shortness of breath.   Cardiovascular: Negative for chest pain and leg swelling.  Gastrointestinal: Negative for nausea, vomiting and diarrhea.  Musculoskeletal: Positive for arthralgias.  Skin: Negative for color change and pallor.  Neurological: Negative for weakness and numbness.  All other systems reviewed and are negative.    Allergies  Peanut-containing drug products  Home Medications   Current Outpatient Rx  Name  Route  Sig  Dispense  Refill  . bismuth subsalicylate (KAOPECTATE) 262 MG/15ML suspension   Oral   Take 15 mLs by mouth every 6 (six) hours as needed (PRN diarrhea).         . citalopram (CELEXA) 20 MG tablet   Oral   Take 20 mg by mouth at bedtime.         . dicyclomine (BENTYL) 10 MG capsule   Oral   Take 10 mg by mouth 4 (four) times daily -  before meals and at bedtime.         . enalapril (VASOTEC) 10 MG tablet   Oral   Take 1 tablet (10 mg total) by mouth every evening. START TAKING ONLY AFTER YOU HAVE  BEEN SEEN BY YOUR DOCTOR         . gabapentin (NEURONTIN) 300 MG capsule      TAKE ONE CAPSULE BY MOUTH AT BEDTIME AND  INCREASE  TO  ONE  CAPSULE  THREE  TIMES  DAILY  AS  TOLERATED.  PATIENT  MUST  BE  SEEN.   90 capsule   2   . insulin glargine (LANTUS) 100 UNIT/ML injection   Subcutaneous   Inject 40 Units into the skin at bedtime.         . insulin lispro (HUMALOG) 100 UNIT/ML injection   Subcutaneous   Inject 0-15 Units into the skin 4 (four) times daily -  before meals and at bedtime. sliding scale: CBG 70 - 120: 0 uunits: CBG 121 - 150: 2 units; CBG 151 - 200: 3 units; CBG 201 - 250: 5 uunits; CBG 251 - 300: 8  units;CBG 301 - 350: 11 units; CBG 351 - 400: 15 units; CBG > 400 Call MD         . insulin lispro (HUMALOG) 100 UNIT/ML injection   Subcutaneous   Inject 5 Units into the skin 3 (three) times daily before meals.         . metoCLOPramide (REGLAN) 10 MG tablet   Oral   Take 1 tablet (10 mg total) by mouth 4 (four) times daily.   120 tablet   0   . promethazine (PHENERGAN) 25 MG tablet   Oral   Take 25 mg by mouth every 6 (six) hours as needed. For nausea          BP 131/83  Pulse 119  Temp(Src) 98.3 F (36.8 C) (Oral)  Resp 18  Ht 5\' 7"  (1.702 m)  Wt 140 lb (63.504 kg)  BMI 21.92 kg/m2  SpO2 99%  LMP 02/14/2013  Physical Exam  Nursing note and vitals reviewed. Constitutional: She is oriented to person, place, and time. She appears well-developed and well-nourished. No distress.  Patient tearful; nontoxic appearing and in NAD  HENT:  Head: Normocephalic and atraumatic.  Eyes: Conjunctivae and EOM are normal. Pupils are equal, round, and reactive to light. No scleral icterus.  Neck: Normal range of motion.  Cardiovascular: Normal rate, regular rhythm, normal heart sounds and intact distal pulses.   DP and PT pulses 2+ bilaterally  Pulmonary/Chest: Effort normal and breath sounds normal. No respiratory distress. She has no wheezes. She has no rales.  Abdominal: Soft.  Musculoskeletal: Normal range of motion.  Neurological: She is alert and oriented to person, place, and time.  No gross sensory deficits appreciated. Patient moves extremities without ataxia. DTRs normal and symmetric.  Skin: Skin is warm and dry. No rash noted. She is not diaphoretic. No erythema. No pallor.  Psychiatric: She has a normal mood and affect. Her behavior is normal.    ED Course  Procedures (including critical care time) Labs Review Labs Reviewed  GLUCOSE, CAPILLARY - Abnormal; Notable for the following:    Glucose-Capillary 183 (*)    All other components within normal limits  CBC -  Abnormal; Notable for the following:    HCT 35.4 (*)    RDW 11.4 (*)    All other components within normal limits  POCT I-STAT, CHEM 8 - Abnormal; Notable for the following:    Calcium, Ion 1.26 (*)    All other components within normal limits  POCT PREGNANCY, URINE   Imaging Review No results found.  EKG Interpretation   None  MDM   1. Arthralgia of lower leg, unspecified laterality   2. Hyperglycemia    Uncomplicated arthralgias of b/l lower extremities. Patient well and nontoxic appearing, hemodynamically stable, and afebrile. She is neurovascularly intact on physical exam with normal sensation and reflexes in her b/l lower extremities. Patient denies any trauma or injury to the area. No lower extremity swelling, erythema, or heat to touch appreciated to suggest infectious process. Chronicity and bilateral nature of pain makes DVT less likely. Do not believe emergent imaging is indicated at this time.  Patient found to be hyperglycemic on arrival with CBG of 183. Hyperglycemia is without an anion gap acidosis; patient's work up does not suggest her to be in DKA today. Pain managed in ED with Decadron, Percocet, and Dilaudid with improvement in symptoms. Patient will have her followup with her neurologist in 9 hours. She is stable for discharge today. Return precautions provided and patient agreeable to plan with no unaddressed concerns. Case discussed with Dr. Fonnie Jarvis who is in agreement with this work up, management plan, and patient's stability for d/c.    Antony Madura, PA-C 03/19/13 1925

## 2013-03-17 NOTE — ED Provider Notes (Signed)
CSN: [id number]     Arrival date & time 03/17/13  1540 History   First MD Initiated Contact with Patient 03/17/13 1724     Chief Complaint  Patient presents with  . Muscle Pain    Seen last PM in ED   (Consider location/radiation/quality/duration/timing/severity/associated sxs/prior Treatment) HPI  [name] is a 22 year old AA female with a history of Type I DM and neuropathic pain, who presents today complaining of generalized body aches. She was seen in the ED last night for same, treated with Percocet, Dilaudid and Toradol, and discharged to see Neurologist for scheduled appt tomorrow. She complains of elbow, knee, and ankle joint pain that begin yesterday at the time of her ED visit. The pain has gotten worse since this morning.  She describes the all-over pain as sharp and stabbing. Nothing helps the pain. She has noticed swelling in both ankles, but denies swelling, redness in the knees and elbows. She denies injury to any of these sites. She takes Gabapentin 300mg  TID, which usually helps, but lately hasn't touched the pain.   She notes an episode of chest tightness this morning when she awoken,  Which lasted for 1 minute and seemed to come on during a feeling of anxiety. She reports mild shortness of breath during this episode.  She denies exertional component.   She denies , shortness of breath, hemoptysis, cough , hematuria, abdominal pain, N/V/D,changes in bowel habits, bloody stools, rash, weakness, numbness, headaches, double vision. LMP: February 19, 2013 Past Medical History  Diagnosis Date  . Diabetes mellitus   . Hypertension   . Diabetes type 1, uncontrolled 11/14/2011    Since age 14  . Gastroparesis    Past Surgical History  Procedure Laterality Date  . Ankle surgery    . Cholecystectomy  11/15/2011    Procedure: LAPAROSCOPIC CHOLECYSTECTOMY WITH INTRAOPERATIVE CHOLANGIOGRAM;  Surgeon: [name], MD;  Location: [facility];  Service: General;  Laterality: N/A;  .  Laparoscopy  11/23/2011    Procedure: LAPAROSCOPY DIAGNOSTIC;  Surgeon: Mariella Saa, MD;  Location: [facility];  Service: General;  Laterality: N/A;  . Esophagogastroduodenoscopy  12/03/2011    Procedure: ESOPHAGOGASTRODUODENOSCOPY (EGD);  Surgeon: Theda Belfast, MD;  Location: [facility];  Service: Endoscopy;  Laterality: N/A;   Family History  Problem Relation Age of Onset  . Diabetes Mother   . Hypertension Father    History  Substance Use Topics  . Smoking status: Never Smoker   . Smokeless tobacco: Never Used  . Alcohol Use: Yes     Comment: Occasional   OB History   Grav Para Term Preterm Abortions TAB SAB Ect Mult Living                 Review of Systems All other systems negative except as documented in the HPI. All pertinent positives and negatives as reviewed in the HPI. Allergies  Peanut-containing drug products  Home Medications   Current Outpatient Rx  Name  Route  Sig  Dispense  Refill  . bismuth subsalicylate (KAOPECTATE) 262 MG/15ML suspension   Oral   Take 15 mLs by mouth every 6 (six) hours as needed (PRN diarrhea).         . citalopram (CELEXA) 20 MG tablet   Oral   Take 20 mg by mouth at bedtime.         . dicyclomine (BENTYL) 10 MG capsule   Oral   Take 10 mg by mouth 4 (four) times daily -  before meals and at bedtime.         . enalapril (VASOTEC) 10 MG tablet   Oral   Take 1 tablet (10 mg total) by mouth every evening. START TAKING ONLY AFTER YOU HAVE BEEN SEEN BY YOUR DOCTOR         . gabapentin (NEURONTIN) 300 MG capsule      TAKE ONE CAPSULE BY MOUTH AT BEDTIME AND  INCREASE  TO  ONE  CAPSULE  THREE  TIMES  DAILY  AS  TOLERATED.  PATIENT  MUST  BE  SEEN.   90 capsule   2   . insulin glargine (LANTUS) 100 UNIT/ML injection   Subcutaneous   Inject 40 Units into the skin at bedtime.         . insulin lispro (HUMALOG) 100 UNIT/ML injection   Subcutaneous   Inject 0-15 Units into the skin 4 (four) times daily -  before  meals and at bedtime. sliding scale: CBG 70 - 120: 0 uunits: CBG 121 - 150: 2 units; CBG 151 - 200: 3 units; CBG 201 - 250: 5 uunits; CBG 251 - 300: 8 units;CBG 301 - 350: 11 units; CBG 351 - 400: 15 units; CBG > 400 Call MD         . insulin lispro (HUMALOG) 100 UNIT/ML injection   Subcutaneous   Inject 5 Units into the skin 3 (three) times daily before meals.         . metoCLOPramide (REGLAN) 10 MG tablet   Oral   Take 1 tablet (10 mg total) by mouth 4 (four) times daily.   120 tablet   0   . promethazine (PHENERGAN) 25 MG tablet   Oral   Take 25 mg by mouth every 6 (six) hours as needed. For nausea          BP 118/82  Pulse 103  Temp(Src) 97.9 F (36.6 C) (Oral)  Resp 20  SpO2 96%  LMP 02/14/2013 Physical Exam  Constitutional: She is oriented to person, place, and time. She appears well-developed and well-nourished. She appears distressed (tearful).  HENT:  Head: Normocephalic and atraumatic.  Eyes: Pupils are equal, round, and reactive to light.  Neck: Normal range of motion. Neck supple.  Cardiovascular: Regular rhythm and normal heart sounds.   Tachycardic at 102  Pulmonary/Chest: Effort normal and breath sounds normal. No respiratory distress.  Abdominal: Soft. She exhibits no distension. There is no tenderness.  Musculoskeletal: Normal range of motion. She exhibits no edema and no tenderness.  Neurological: She is alert and oriented to person, place, and time. No cranial nerve deficit. She exhibits normal muscle tone. Coordination normal.  Patellar and Achilles reflexes: Hyporeflexic  Skin: Skin is warm and dry.  Psychiatric: She has a normal mood and affect. Her behavior is normal.    ED Course  Procedures (including critical care time) Patient is, advised she is going to followup with her primary Dr. for further evaluation and referral to rheumatology.  She does return here as needed.  The patient is stable upon discharge.  Patient is also advised to  increase her fluid intake.  Advised that this could be related to rheumatological issue or something to do with her diabetes.  Patient voices an understanding.  All questions were answered  Carlyle Dolly, PA-C 03/18/13 0111

## 2013-03-17 NOTE — ED Notes (Signed)
Pt able to ambulate without assistance.  

## 2013-03-17 NOTE — ED Notes (Signed)
Pt alert, nad, arrives from home, c/o "pain all over", seen Last PM in ED with same c/o, returns with cont pain

## 2013-03-18 ENCOUNTER — Encounter: Payer: Self-pay | Admitting: Nurse Practitioner

## 2013-03-18 ENCOUNTER — Ambulatory Visit (INDEPENDENT_AMBULATORY_CARE_PROVIDER_SITE_OTHER): Payer: 59 | Admitting: Nurse Practitioner

## 2013-03-18 VITALS — BP 118/78 | HR 111 | Temp 97.8°F | Ht 67.0 in | Wt 142.0 lb

## 2013-03-18 DIAGNOSIS — E1149 Type 2 diabetes mellitus with other diabetic neurological complication: Secondary | ICD-10-CM

## 2013-03-18 DIAGNOSIS — E1142 Type 2 diabetes mellitus with diabetic polyneuropathy: Secondary | ICD-10-CM

## 2013-03-18 DIAGNOSIS — E1140 Type 2 diabetes mellitus with diabetic neuropathy, unspecified: Secondary | ICD-10-CM

## 2013-03-18 DIAGNOSIS — M2550 Pain in unspecified joint: Secondary | ICD-10-CM

## 2013-03-18 MED ORDER — GABAPENTIN 300 MG PO CAPS: 1200.0000 mg | ORAL_CAPSULE | Freq: Three times a day (TID) | ORAL | Status: DC

## 2013-03-18 NOTE — Progress Notes (Signed)
PATIENT: [name] DOB: [birth year]   REASON FOR VISIT: follow up for Neuropathy HISTORY FROM: patient  HISTORY OF PRESENT ILLNESS: 01/12/12 (VP): Patient reports history of diabetes since age 22 years old. Recent hemoglobin A1c 6.1. Since March 2013, she's developed numbness and tingling in her toes, feet, now extending up to her mid thighs. She's also developed stomach and back pain since June. No numbness or tingling in her fingers or hands.  UPDATE 03/18/13 (LL):  Patient calls for revisit due to experiencing worsening pain in the last 2-3 weeks.  Progression has been steadily worsening in that time. Patient had been prescribed Gabapentin 300 mg TID at last visit in 2013.  It had been increased to 900 mg TID which she states "does not touch it."  The pain in the last 2-3 weeks she describes as different, more in her joints.  She describes the all-over pain as sharp and stabbing. Nothing helps the pain.   She states that her ankles, knees, elbows and wrists feel like they are swelling and feel warm.  She has tried taking Aleve which did not help. She denies fever, cough, rash, flu-like symptoms, headache, N/V/D.  Her diabetes has been poorly controlled.  She is trying to get it better controlled under the care of her PCP, Dr. Karlton Lemon.  Review of Systems  Out of a complete 14 system review, the patient complains of only the following symptoms, and all other reviewed systems are negative.  Constitutional:  Fatigue   Eyes: Double Vision    Neurological: Numbness   Musculoskeletal: Joint pain, joint swelling   ALLERGIES: Allergies  Allergen Reactions  . Peanut-Containing Drug Products Anaphylaxis    [place] nuts specifically      HOME MEDICATIONS: Outpatient Prescriptions Prior to Visit  Medication Sig Dispense Refill  . bismuth subsalicylate (KAOPECTATE) 262 MG/15ML suspension Take 15 mLs by mouth every 6 (six) hours as needed (PRN diarrhea).      . citalopram (CELEXA) 20 MG  tablet Take 20 mg by mouth at bedtime.      . dicyclomine (BENTYL) 10 MG capsule Take 10 mg by mouth 4 (four) times daily -  before meals and at bedtime.      . enalapril (VASOTEC) 10 MG tablet Take 10 mg by mouth daily.      . insulin glargine (LANTUS) 100 UNIT/ML injection Inject 40 Units into the skin at bedtime.      . insulin lispro (HUMALOG) 100 UNIT/ML injection Inject 0-15 Units into the skin 4 (four) times daily -  before meals and at bedtime. sliding scale: CBG 70 - 120: 0 uunits: CBG 121 - 150: 2 units; CBG 151 - 200: 3 units; CBG 201 - 250: 5 uunits; CBG 251 - 300: 8 units;CBG 301 - 350: 11 units; CBG 351 - 400: 15 units; CBG > 400 Call MD      . insulin lispro (HUMALOG) 100 UNIT/ML injection Inject 5 Units into the skin 3 (three) times daily before meals.      . metoCLOPramide (REGLAN) 10 MG tablet Take 1 tablet (10 mg total) by mouth 4 (four) times daily.  120 tablet  0  . oxyCODONE-acetaminophen (PERCOCET/ROXICET) 5-325 MG per tablet Take 1 tablet by mouth every 6 (six) hours as needed for severe pain.  15 tablet  0  . promethazine (PHENERGAN) 25 MG tablet Take 25 mg by mouth every 6 (six) hours as needed for nausea or vomiting. For nausea      .  gabapentin (NEURONTIN) 300 MG capsule Take 300-900 mg by mouth See admin instructions. 300 mg at bedtime for one week then increase to 300 mg three times daily as tolerated.       No facility-administered medications prior to visit.    PAST MEDICAL HISTORY: Past Medical History  Diagnosis Date  . Diabetes mellitus   . Hypertension   . Diabetes type 1, uncontrolled 11/14/2011    Since age 16  . Gastroparesis     PAST SURGICAL HISTORY: Past Surgical History  Procedure Laterality Date  . Ankle surgery    . Cholecystectomy  11/15/2011    Procedure: LAPAROSCOPIC CHOLECYSTECTOMY WITH INTRAOPERATIVE CHOLANGIOGRAM;  Surgeon: [name], MD;  Location: [facility];  Service: General;  Laterality: N/A;  . Laparoscopy  11/23/2011     Procedure: LAPAROSCOPY DIAGNOSTIC;  Surgeon: Edward Jolly, MD;  Location: [facility];  Service: General;  Laterality: N/A;  . Esophagogastroduodenoscopy  12/03/2011    Procedure: ESOPHAGOGASTRODUODENOSCOPY (EGD);  Surgeon: Beryle Beams, MD;  Location: [facility];  Service: Endoscopy;  Laterality: N/A;    FAMILY HISTORY: Family History  Problem Relation Age of Onset  . Diabetes Mother   . Hypertension Father     SOCIAL HISTORY: History   Social History  . Marital Status: Married    Spouse Name: sergio    Number of Children: 0  . Years of Education: college   Occupational History  . [employer]    Social History Main Topics  . Smoking status: Never Smoker   . Smokeless tobacco: Never Used  . Alcohol Use: Yes     Comment: Occasional  . Drug Use: No  . Sexual Activity: Yes   Other Topics Concern  . Not on file   Social History Narrative  . No narrative on file   PHYSICAL EXAM  Filed Vitals:   03/18/13 1027  BP: 118/78  Pulse: 111  Temp: 97.8 F (36.6 C)  TempSrc: Oral  Height: 5' 7"  (1.702 m)  Weight: 142 lb (64.411 kg)   Body mass index is 22.24 kg/(m^2).  Generalized: Well developed, she appears distressed, tearful. Head: normocephalic and atraumatic. Oropharynx benign  Neck: Supple, no carotid bruits  Cardiac: Regular rate rhythm, no murmur  Musculoskeletal: No deformity, NO VISIBLE SWELLING OF JOINTS.  Neurological examination  Mentation: Alert oriented to time, place, history taking. Follows all commands speech and language fluent Cranial nerve II-XII: Fundoscopic exam reveals sharp disc margins.Pupils were equal round reactive to light extraocular movements were full, visual field were full on confrontational test. Facial sensation and strength were normal. hearing was intact to finger rubbing bilaterally. Uvula tongue midline. head turning and shoulder shrug and were normal and symmetric.Tongue protrusion into cheek strength was normal. Motor:  normal bulk and tone, full strength in the BUE, BLE, fine finger movements normal, no pronator drift. No focal weakness Sensory: normal and symmetric to light touch, pinprick, and  vibration  Coordination: finger-nose-finger, heel-to-shin bilaterally, no dysmetria Deep tendon reflexes in the upper and lower extremity are ABSENT and symmetric. Gait and Station: Rising up from seated position without assistance, normal stance, without trunk ataxia, moderate stride, good arm swing, smooth turning, able to perform tiptoe, and heel walking without difficulty.   DIAGNOSTIC DATA (LABS, IMAGING, TESTING) - I reviewed patient records, labs, notes, testing and imaging myself where available.  Lab Results  Component Value Date   WBC 4.8 03/17/2013   HGB 13.0 03/17/2013   HCT 36.2 03/17/2013   MCV 85.8  03/17/2013   PLT 229 03/17/2013      Component Value Date/Time   NA 136 03/17/2013 1830   K 3.9 03/17/2013 1830   CL 100 03/17/2013 1830   CO2 24 03/17/2013 1830   GLUCOSE 150* 03/17/2013 1830   BUN 16 03/17/2013 1830   CREATININE 0.35* 03/17/2013 1830   CREATININE 0.68 04/15/2012 1134   CALCIUM 9.3 03/17/2013 1830   PROT 7.0 09/01/2012 0217   ALBUMIN 3.4* 09/01/2012 0217   AST 21 09/01/2012 0217   ALT 12 09/01/2012 0217   ALKPHOS 169* 09/01/2012 0217   BILITOT 0.3 09/01/2012 0217   GFRNONAA >90 03/17/2013 1830   GFRAA >90 03/17/2013 1830   Lab Results  Component Value Date   HGBA1C 11.9* 09/01/2012   Vit B12   651      02/28/12  Lab Results  Component Value Date   TSH 0.467 02/28/2012   CK    61      03/17/13  ASSESSMENT AND PLAN 22 y.o. year old female  has a past medical history of Diabetes mellitus; Hypertension; Diabetes type 1, uncontrolled (11/14/2011); and Gastroparesis here with diabetic neuropathy, diffuse arthralgias and subjective joint swelling.  PLAN: Increase Gabapentin 300 mg to 4 capsules three times daily if needed.  Samples of Lyrica 75 mg given with instructions not to take  with Gabapentin.  She may call for Script if she gets better response with Lyrica than Gabapentin. Referral to Rhuematology.   Follow up with PCP for DM. Follow up in 3 months.  Orders Placed This Encounter  Procedures  . Ambulatory referral to Rheumatology   Meds ordered this encounter  Medications  . gabapentin (NEURONTIN) 300 MG capsule    Sig: Take 4 capsules (1,200 mg total) by mouth 3 (three) times daily.    Dispense:  360 capsule    Refill:  5    Order Specific Question:  Supervising Provider    Answer:  Penni Bombard [[id number]]   Return in about 3 months (around 06/16/2013).  Philmore Pali, MSN, NP-C 03/18/2013, 11:34 AM [facility] [address] [place], [place] [address] [phone]  Note: This document was prepared with digital dictation and possible smart phrase technology. Any transcriptional errors that result from this process are unintentional.

## 2013-03-18 NOTE — Patient Instructions (Signed)
Increase Gabapentin to 4 capsules three times daily if needed.  Referral to Rhuematology.  Someone will call you to make appointment.  Follow up in 3 months.

## 2013-03-18 NOTE — ED Provider Notes (Signed)
Medical screening examination/treatment/procedure(s) were performed by non-physician practitioner and as supervising physician I was immediately available for consultation/collaboration.  EKG Interpretation   None         Hoy Morn, MD 03/18/13 [phone]

## 2013-03-18 NOTE — ED Provider Notes (Signed)
Medical screening examination/treatment/procedure(s) were performed by non-physician practitioner and as supervising physician I was immediately available for consultation/collaboration.  Hinton Luellen M Joline Encalada, MD 03/18/13 2028 

## 2013-03-21 ENCOUNTER — Telehealth: Payer: Self-pay | Admitting: Nurse Practitioner

## 2013-03-21 NOTE — Telephone Encounter (Signed)
I called the patient back.  Advised we do not prescribe this type of medication.  She will contact her PCP to request med.

## 2013-03-21 NOTE — ED Provider Notes (Signed)
Medical screening examination/treatment/procedure(s) were performed by non-physician practitioner and as supervising physician I was immediately available for consultation/collaboration.  Chatham Howington M Thoma Paulsen, MD 03/21/13 2351 

## 2013-03-21 NOTE — Telephone Encounter (Signed)
No. I do not rx percocet. She make ask PCP or be referred to pain clinic. -VRP

## 2013-03-26 ENCOUNTER — Emergency Department (HOSPITAL_COMMUNITY)
Admission: EM | Admit: 2013-03-26 | Discharge: 2013-03-26 | Disposition: A | Discharge status: Home / Self Care | Payer: 59 | Attending: Emergency Medicine | Admitting: Emergency Medicine

## 2013-03-26 ENCOUNTER — Encounter (HOSPITAL_COMMUNITY): Payer: Self-pay | Admitting: Emergency Medicine

## 2013-03-26 DIAGNOSIS — Z79899 Other long term (current) drug therapy: Secondary | ICD-10-CM | POA: Insufficient documentation

## 2013-03-26 DIAGNOSIS — Z8719 Personal history of other diseases of the digestive system: Secondary | ICD-10-CM | POA: Insufficient documentation

## 2013-03-26 DIAGNOSIS — I10 Essential (primary) hypertension: Secondary | ICD-10-CM | POA: Insufficient documentation

## 2013-03-26 DIAGNOSIS — M25569 Pain in unspecified knee: Secondary | ICD-10-CM | POA: Insufficient documentation

## 2013-03-26 DIAGNOSIS — Z9889 Other specified postprocedural states: Secondary | ICD-10-CM | POA: Insufficient documentation

## 2013-03-26 DIAGNOSIS — IMO0002 Reserved for concepts with insufficient information to code with codable children: Secondary | ICD-10-CM | POA: Insufficient documentation

## 2013-03-26 DIAGNOSIS — E1065 Type 1 diabetes mellitus with hyperglycemia: Secondary | ICD-10-CM | POA: Insufficient documentation

## 2013-03-26 DIAGNOSIS — M2550 Pain in unspecified joint: Secondary | ICD-10-CM

## 2013-03-26 DIAGNOSIS — Z794 Long term (current) use of insulin: Secondary | ICD-10-CM | POA: Insufficient documentation

## 2013-03-26 DIAGNOSIS — R000 Tachycardia, unspecified: Secondary | ICD-10-CM | POA: Insufficient documentation

## 2013-03-26 LAB — CBC WITH DIFFERENTIAL/PLATELET
Basophils Absolute: 0 10*3/uL (ref 0.0–0.1)
Basophils Relative: 1 % (ref 0–1)
Eosinophils Absolute: 0.1 10*3/uL (ref 0.0–0.7)
Eosinophils Relative: 2 % (ref 0–5)
HCT: 38.2 % (ref 36.0–46.0)
Hemoglobin: 13.8 g/dL (ref 12.0–15.0)
Lymphocytes Relative: 50 % — ABNORMAL HIGH (ref 12–46)
Lymphs Abs: 2 10*3/uL (ref 0.7–4.0)
MCH: 30.6 pg (ref 26.0–34.0)
MCHC: 36.1 g/dL — ABNORMAL HIGH (ref 30.0–36.0)
MCV: 84.7 fL (ref 78.0–100.0)
Monocytes Absolute: 0.4 10*3/uL (ref 0.1–1.0)
Monocytes Relative: 10 % (ref 3–12)
Neutro Abs: 1.5 10*3/uL — ABNORMAL LOW (ref 1.7–7.7)
Neutrophils Relative %: 38 % — ABNORMAL LOW (ref 43–77)
Platelets: 196 10*3/uL (ref 150–400)
RBC: 4.51 MIL/uL (ref 3.87–5.11)
RDW: 11.3 % — ABNORMAL LOW (ref 11.5–15.5)
WBC: 4 10*3/uL (ref 4.0–10.5)

## 2013-03-26 LAB — BASIC METABOLIC PANEL
BUN: 14 mg/dL (ref 6–23)
CO2: 28 mEq/L (ref 19–32)
Calcium: 9.5 mg/dL (ref 8.4–10.5)
Chloride: 98 mEq/L (ref 96–112)
Creatinine, Ser: 0.4 mg/dL — ABNORMAL LOW (ref 0.50–1.10)
GFR calc Af Amer: >90 mL/min (ref >90)
GFR calc non Af Amer: >90 mL/min (ref >90)
Glucose, Bld: 124 mg/dL — ABNORMAL HIGH (ref 70–99)
Potassium: 3.8 mEq/L (ref 3.5–5.1)
Sodium: 135 mEq/L (ref 135–145)

## 2013-03-26 LAB — GLUCOSE, CAPILLARY: Glucose-Capillary: 153 mg/dL — ABNORMAL HIGH (ref 70–99)

## 2013-03-26 MED ORDER — KETOROLAC TROMETHAMINE 30 MG/ML IJ SOLN
30.0000 mg | Freq: Once | INTRAMUSCULAR | Status: AC
  Administered 2013-03-26: 30 mg via INTRAVENOUS
  Filled 2013-03-26: qty 1

## 2013-03-26 MED ORDER — OXYCODONE-ACETAMINOPHEN 5-325 MG PO TABS: 1.0000 | ORAL_TABLET | Freq: Four times a day (QID) | ORAL | Status: DC | PRN

## 2013-03-26 MED ORDER — HYDROMORPHONE HCL PF 1 MG/ML IJ SOLN
1.0000 mg | Freq: Once | INTRAMUSCULAR | Status: AC
  Administered 2013-03-26: 1 mg via INTRAVENOUS
  Filled 2013-03-26: qty 1

## 2013-03-26 MED ORDER — SODIUM CHLORIDE 0.9 % IV BOLUS (SEPSIS)
1000.0000 mL | Freq: Once | INTRAVENOUS | Status: AC
  Administered 2013-03-26: 1000 mL via INTRAVENOUS

## 2013-03-26 NOTE — ED Provider Notes (Signed)
CSN: [id number]     Arrival date & time 03/26/13  0224 History   First MD Initiated Contact with Patient 03/26/13 [phone]     Chief Complaint  Patient presents with  . Leg Pain  . Tachycardia   (Consider location/radiation/quality/duration/timing/severity/associated sxs/prior Treatment) HPI Patient presents to the emergency department with pain in both legs extending from the hip all the way to the ankles.  Patient, states, that the pain is in her joints.  She was seen by neurology last week, for evaluation and they felt she needed further testing from a rheumatologist Past Medical History  Diagnosis Date  . Diabetes mellitus   . Hypertension   . Diabetes type 1, uncontrolled 11/14/2011    Since age 72  . Gastroparesis    Past Surgical History  Procedure Laterality Date  . Ankle surgery    . Cholecystectomy  11/15/2011    Procedure: LAPAROSCOPIC CHOLECYSTECTOMY WITH INTRAOPERATIVE CHOLANGIOGRAM;  Surgeon: [name], MD;  Location: [facility];  Service: General;  Laterality: N/A;  . Laparoscopy  11/23/2011    Procedure: LAPAROSCOPY DIAGNOSTIC;  Surgeon: Mariella Saa, MD;  Location: [facility];  Service: General;  Laterality: N/A;  . Esophagogastroduodenoscopy  12/03/2011    Procedure: ESOPHAGOGASTRODUODENOSCOPY (EGD);  Surgeon: Theda Belfast, MD;  Location: [facility];  Service: Endoscopy;  Laterality: N/A;   Family History  Problem Relation Age of Onset  . Diabetes Mother   . Hypertension Father    History  Substance Use Topics  . Smoking status: Never Smoker   . Smokeless tobacco: Never Used  . Alcohol Use: Yes     Comment: Occasional   OB History   Grav Para Term Preterm Abortions TAB SAB Ect Mult Living                 Review of Systems  Allergies  Peanut-containing drug products  Home Medications   Current Outpatient Rx  Name  Route  Sig  Dispense  Refill  . citalopram (CELEXA) 20 MG tablet   Oral   Take 20 mg by mouth at bedtime.         .  dicyclomine (BENTYL) 10 MG capsule   Oral   Take 10 mg by mouth 4 (four) times daily -  before meals and at bedtime.         . enalapril (VASOTEC) 10 MG tablet   Oral   Take 10 mg by mouth daily.         Marland Kitchen gabapentin (NEURONTIN) 300 MG capsule   Oral   Take 4 capsules (1,200 mg total) by mouth 3 (three) times daily.   360 capsule   5   . insulin glargine (LANTUS) 100 UNIT/ML injection   Subcutaneous   Inject 40 Units into the skin at bedtime.         . insulin lispro (HUMALOG) 100 UNIT/ML injection   Subcutaneous   Inject 0-15 Units into the skin 4 (four) times daily -  before meals and at bedtime. sliding scale: CBG 70 - 120: 0 uunits: CBG 121 - 150: 2 units; CBG 151 - 200: 3 units; CBG 201 - 250: 5 uunits; CBG 251 - 300: 8 units;CBG 301 - 350: 11 units; CBG 351 - 400: 15 units; CBG > 400 Call MD         . insulin lispro (HUMALOG) 100 UNIT/ML injection   Subcutaneous   Inject 5 Units into the skin 3 (three) times daily before meals.         Marland Kitchen  metoCLOPramide (REGLAN) 10 MG tablet   Oral   Take 1 tablet (10 mg total) by mouth 4 (four) times daily.   120 tablet   0   . naproxen (NAPROSYN) 500 MG tablet   Oral   Take 500 mg by mouth every 12 (twelve) hours as needed for moderate pain.         Marland Kitchen oxyCODONE-acetaminophen (PERCOCET/ROXICET) 5-325 MG per tablet   Oral   Take 1 tablet by mouth every 6 (six) hours as needed for severe pain.   15 tablet   0   . promethazine (PHENERGAN) 25 MG tablet   Oral   Take 25 mg by mouth every 6 (six) hours as needed for nausea or vomiting. For nausea          BP 113/54  Pulse 107  Temp(Src) 98.2 F (36.8 C) (Oral)  Resp 18  Ht 5\' 7"  (1.702 m)  Wt 140 lb (63.504 kg)  BMI 21.92 kg/m2  SpO2 97%  LMP 02/14/2013 Physical Exam  Nursing note and vitals reviewed. Constitutional: She is oriented to person, place, and time. She appears well-developed and well-nourished. No distress.  HENT:  Head: Normocephalic and  atraumatic.  Mouth/Throat: Oropharynx is clear and moist.  Eyes: Pupils are equal, round, and reactive to light.  Cardiovascular: Normal rate, regular rhythm and normal heart sounds.  Exam reveals no gallop and no friction rub.   No murmur heard. Pulmonary/Chest: Effort normal and breath sounds normal.  Musculoskeletal: She exhibits no edema.  Patient has pain up and down both lower extremities mainly in the joints.  Increased with movement  Neurological: She is alert and oriented to person, place, and time.  Skin: Skin is warm and dry. No rash noted. No erythema.    ED Course  Procedures (including critical care time) Labs Review Labs Reviewed  GLUCOSE, CAPILLARY - Abnormal; Notable for the following:    Glucose-Capillary 153 (*)    All other components within normal limits  BASIC METABOLIC PANEL - Abnormal; Notable for the following:    Glucose, Bld 124 (*)    Creatinine, Ser 0.40 (*)    All other components within normal limits  CBC WITH DIFFERENTIAL - Abnormal; Notable for the following:    MCHC 36.1 (*)    RDW 11.3 (*)    Neutrophils Relative % 38 (*)    Neutro Abs 1.5 (*)    Lymphocytes Relative 50 (*)    All other components within normal limits   Patient be asked to followup with her primary care Dr. and the rheumatologist.  She is scheduled to see the patient is advised to return here as needed.  Patient will be given further pain control for home.  Patient does not have any signs of infection at this time.  Patient's pulse rate has responded well to pain medication and fluids    Carlyle Dolly, PA-C 03/26/13 [phone]

## 2013-03-26 NOTE — ED Notes (Signed)
Pt states she goes to first visit with rheumatologist January 13 for a definitive diagnosis of RA,  States leg pain 10/10 and her gabapentin isn't helping at all

## 2013-03-26 NOTE — ED Notes (Signed)
Pt reports pain has worsened 10/10 in legs bilaterally. PA made aware.

## 2013-03-26 NOTE — ED Notes (Signed)
Pt reports she has been evaluated for leg pain recently and seen by a neurologist for RA. Pt reports she takes Gabapentin for pain and this usually works but tonight the pain is unbearable. Pt tearful in triage, a&o, noted to be tachycardic, states she sometimes has this with pain.

## 2013-03-26 NOTE — ED Provider Notes (Signed)
Medical screening examination/treatment/procedure(s) were performed by non-physician practitioner and as supervising physician I was immediately available for consultation/collaboration.  EKG Interpretation   None        [name] Revoir F Alysabeth Scalia, MD 03/26/13 1910 

## 2013-03-29 ENCOUNTER — Emergency Department (HOSPITAL_COMMUNITY)
Admission: EM | Admit: 2013-03-29 | Discharge: 2013-03-30 | Disposition: A | Discharge status: Home / Self Care | Payer: 59 | Attending: Emergency Medicine | Admitting: Emergency Medicine

## 2013-03-29 ENCOUNTER — Encounter (HOSPITAL_COMMUNITY): Payer: Self-pay | Admitting: Emergency Medicine

## 2013-03-29 DIAGNOSIS — Z8719 Personal history of other diseases of the digestive system: Secondary | ICD-10-CM | POA: Insufficient documentation

## 2013-03-29 DIAGNOSIS — M2550 Pain in unspecified joint: Secondary | ICD-10-CM

## 2013-03-29 DIAGNOSIS — E1065 Type 1 diabetes mellitus with hyperglycemia: Secondary | ICD-10-CM | POA: Insufficient documentation

## 2013-03-29 DIAGNOSIS — Z79899 Other long term (current) drug therapy: Secondary | ICD-10-CM | POA: Insufficient documentation

## 2013-03-29 DIAGNOSIS — IMO0002 Reserved for concepts with insufficient information to code with codable children: Secondary | ICD-10-CM | POA: Insufficient documentation

## 2013-03-29 DIAGNOSIS — Z794 Long term (current) use of insulin: Secondary | ICD-10-CM | POA: Insufficient documentation

## 2013-03-29 DIAGNOSIS — E119 Type 2 diabetes mellitus without complications: Secondary | ICD-10-CM | POA: Insufficient documentation

## 2013-03-29 DIAGNOSIS — R000 Tachycardia, unspecified: Secondary | ICD-10-CM | POA: Insufficient documentation

## 2013-03-29 DIAGNOSIS — Z9889 Other specified postprocedural states: Secondary | ICD-10-CM | POA: Insufficient documentation

## 2013-03-29 NOTE — ED Notes (Signed)
Pt was seen here Saturday for the same, she complains of generalized body pain and was told it may be RA, has an appt on the 13th, was given pain meds Saturday but continues to be in pain.

## 2013-03-30 MED ORDER — OXYCODONE-ACETAMINOPHEN 5-325 MG PO TABS: 1.0000 | ORAL_TABLET | ORAL | Status: DC | PRN

## 2013-03-30 MED ORDER — ONDANSETRON HCL 4 MG/2ML IJ SOLN
4.0000 mg | Freq: Once | INTRAMUSCULAR | Status: AC
  Administered 2013-03-30: 4 mg via INTRAVENOUS
  Filled 2013-03-30: qty 2

## 2013-03-30 MED ORDER — OXYCODONE-ACETAMINOPHEN 5-325 MG PO TABS
2.0000 | ORAL_TABLET | Freq: Once | ORAL | Status: AC
  Administered 2013-03-30: 2 via ORAL
  Filled 2013-03-30: qty 2

## 2013-03-30 MED ORDER — KETOROLAC TROMETHAMINE 30 MG/ML IJ SOLN
30.0000 mg | Freq: Once | INTRAMUSCULAR | Status: AC
  Administered 2013-03-30: 30 mg via INTRAVENOUS
  Filled 2013-03-30: qty 1

## 2013-03-30 MED ORDER — HYDROMORPHONE HCL PF 1 MG/ML IJ SOLN
1.0000 mg | Freq: Once | INTRAMUSCULAR | Status: AC
  Administered 2013-03-30: 1 mg via INTRAVENOUS
  Filled 2013-03-30: qty 1

## 2013-03-30 MED ORDER — SODIUM CHLORIDE 0.9 % IV BOLUS (SEPSIS)
1000.0000 mL | Freq: Once | INTRAVENOUS | Status: AC
  Administered 2013-03-30: 1000 mL via INTRAVENOUS

## 2013-03-30 NOTE — ED Notes (Signed)
Patient asking for pain medication Dr. Dierdre Highman made aware

## 2013-03-30 NOTE — ED Provider Notes (Signed)
CSN: [id number]     Arrival date & time 03/29/13  2105 History   First MD Initiated Contact with Patient 03/30/13 0022     Chief Complaint  Patient presents with  . Pain   (Consider location/radiation/quality/duration/timing/severity/associated sxs/prior Treatment) HPI Hx per PT - Has DM and joint pains ongoing for the last year or more. She is scheduled to see Rhuem.  She is followed by Neurology for her neuropathy and takes gabapentin.  She presents with joint pains, not relieved by alieve at home. She has had severe joint pains for the last month.  She has been to the ER 4 times for these symptoms in the last month. She has not been able to see her PCP in this timeframe. She was prescribed percocet 4 days ago and has ran out. No F/C. No cough, no rash. Pain is stabbing and sharp everywhere with aching joint pains. Pain unchanged in the last month.   Past Medical History  Diagnosis Date  . Diabetes mellitus   . Hypertension   . Diabetes type 1, uncontrolled 11/14/2011    Since age 79  . Gastroparesis    Past Surgical History  Procedure Laterality Date  . Ankle surgery    . Cholecystectomy  11/15/2011    Procedure: LAPAROSCOPIC CHOLECYSTECTOMY WITH INTRAOPERATIVE CHOLANGIOGRAM;  Surgeon: [name], MD;  Location: [facility];  Service: General;  Laterality: N/A;  . Laparoscopy  11/23/2011    Procedure: LAPAROSCOPY DIAGNOSTIC;  Surgeon: Mariella Saa, MD;  Location: [facility];  Service: General;  Laterality: N/A;  . Esophagogastroduodenoscopy  12/03/2011    Procedure: ESOPHAGOGASTRODUODENOSCOPY (EGD);  Surgeon: Theda Belfast, MD;  Location: [facility];  Service: Endoscopy;  Laterality: N/A;   Family History  Problem Relation Age of Onset  . Diabetes Mother   . Hypertension Father    History  Substance Use Topics  . Smoking status: Never Smoker   . Smokeless tobacco: Never Used  . Alcohol Use: Yes     Comment: Occasional   OB History   Grav Para Term Preterm Abortions TAB  SAB Ect Mult Living                 Review of Systems  Constitutional: Negative for fever and chills.  Eyes: Negative for redness and visual disturbance.  Respiratory: Negative for shortness of breath.   Cardiovascular: Negative for chest pain.  Gastrointestinal: Negative for abdominal pain.  Genitourinary: Negative for dysuria.  Musculoskeletal: Positive for joint swelling. Negative for neck pain.  Skin: Negative for rash.  Neurological: Negative for speech difficulty and headaches.  All other systems reviewed and are negative.    Allergies  Peanut-containing drug products  Home Medications   Current Outpatient Rx  Name  Route  Sig  Dispense  Refill  . citalopram (CELEXA) 20 MG tablet   Oral   Take 20 mg by mouth at bedtime.         . dicyclomine (BENTYL) 10 MG capsule   Oral   Take 10 mg by mouth 4 (four) times daily -  before meals and at bedtime.         . enalapril (VASOTEC) 10 MG tablet   Oral   Take 10 mg by mouth daily.         Marland Kitchen gabapentin (NEURONTIN) 300 MG capsule   Oral   Take 4 capsules (1,200 mg total) by mouth 3 (three) times daily.   360 capsule   5   . insulin glargine (  LANTUS) 100 UNIT/ML injection   Subcutaneous   Inject 40 Units into the skin at bedtime.         . insulin lispro (HUMALOG) 100 UNIT/ML injection   Subcutaneous   Inject 0-15 Units into the skin 4 (four) times daily -  before meals and at bedtime. sliding scale: CBG 70 - 120: 0 uunits: CBG 121 - 150: 2 units; CBG 151 - 200: 3 units; CBG 201 - 250: 5 uunits; CBG 251 - 300: 8 units;CBG 301 - 350: 11 units; CBG 351 - 400: 15 units; CBG > 400 Call MD         . metoCLOPramide (REGLAN) 10 MG tablet   Oral   Take 1 tablet (10 mg total) by mouth 4 (four) times daily.   120 tablet   0   . naproxen (NAPROSYN) 500 MG tablet   Oral   Take 500 mg by mouth every 12 (twelve) hours as needed for moderate pain.         Marland Kitchen oxyCODONE-acetaminophen (PERCOCET/ROXICET) 5-325 MG per  tablet   Oral   Take 1 tablet by mouth every 6 (six) hours as needed for severe pain.   15 tablet   0   . promethazine (PHENERGAN) 25 MG tablet   Oral   Take 25 mg by mouth every 6 (six) hours as needed for nausea or vomiting. For nausea          BP 134/84  Pulse 122  Temp(Src) 98.5 F (36.9 C) (Oral)  Resp 14  SpO2 97%  LMP 02/14/2013 Physical Exam  Constitutional: She is oriented to person, place, and time. She appears well-developed and well-nourished.  HENT:  Head: Normocephalic and atraumatic.  Eyes: EOM are normal. Pupils are equal, round, and reactive to light.  Neck: Neck supple.  Cardiovascular: Normal rate, regular rhythm and intact distal pulses.   Pulmonary/Chest: Effort normal and breath sounds normal. No respiratory distress.  Abdominal: Soft. Bowel sounds are normal. She exhibits no distension. There is no tenderness.  Musculoskeletal: Normal range of motion.  No sig edema to extremities. No erythema. Good ROM throughout with distal N/V intact x 4  Neurological: She is alert and oriented to person, place, and time.  Skin: Skin is warm and dry.    ED Course  Procedures (including critical care time)  Results for orders placed during the hospital encounter of 03/26/13  GLUCOSE, CAPILLARY      Result Value Range   Glucose-Capillary 153 (*) 70 - 99 mg/dL   Comment 1 Notify RN    BASIC METABOLIC PANEL      Result Value Range   Sodium 135  135 - 145 mEq/L   Potassium 3.8  3.5 - 5.1 mEq/L   Chloride 98  96 - 112 mEq/L   CO2 28  19 - 32 mEq/L   Glucose, Bld 124 (*) 70 - 99 mg/dL   BUN 14  6 - 23 mg/dL   Creatinine, Ser 1.61 (*) 0.50 - 1.10 mg/dL   Calcium 9.5  8.4 - 09.6 mg/dL   GFR calc non Af Amer >90  >90 mL/min   GFR calc Af Amer >90  >90 mL/min  CBC WITH DIFFERENTIAL      Result Value Range   WBC 4.0  4.0 - 10.5 K/uL   RBC 4.51  3.87 - 5.11 MIL/uL   Hemoglobin 13.8  12.0 - 15.0 g/dL   HCT 04.5  40.9 - 81.1 %   MCV 84.7  78.0 -  100.0 fL   MCH  30.6  26.0 - 34.0 pg   MCHC 36.1 (*) 30.0 - 36.0 g/dL   RDW 16.1 (*) 09.6 - 04.5 %   Platelets 196  150 - 400 K/uL   Neutrophils Relative % 38 (*) 43 - 77 %   Neutro Abs 1.5 (*) 1.7 - 7.7 K/uL   Lymphocytes Relative 50 (*) 12 - 46 %   Lymphs Abs 2.0  0.7 - 4.0 K/uL   Monocytes Relative 10  3 - 12 %   Monocytes Absolute 0.4  0.1 - 1.0 K/uL   Eosinophils Relative 2  0 - 5 %   Eosinophils Absolute 0.1  0.0 - 0.7 K/uL   Basophils Relative 1  0 - 1 %   Basophils Absolute 0.0  0.0 - 0.1 K/uL    Labs from 3 days ago reviewed as above  IV fluids. IV Dilaudid. Zofran. IV Toradol.  2:52 AM on recheck symptoms resolving. Heart rate normalizing. Patient feels comfortable with plan discharge home and followup primary care physician. She will keep rheumatology appointment.   MDM  Diagnosis: Polyarthralgia, tachycardia  Treated with IV fluids and narcotics with improving symptoms. Old records reviewed, multiple ED visits in the last month for the same presentation and symptoms.  Vital signs and nursing notes reviewed   Sunnie Nielsen, MD 03/30/13 [phone]

## 2013-03-31 ENCOUNTER — Encounter (HOSPITAL_COMMUNITY): Payer: Self-pay | Admitting: Emergency Medicine

## 2013-03-31 ENCOUNTER — Emergency Department (HOSPITAL_COMMUNITY)
Admission: EM | Admit: 2013-03-31 | Discharge: 2013-03-31 | Disposition: A | Discharge status: Home / Self Care | Payer: 59 | Attending: Emergency Medicine | Admitting: Emergency Medicine

## 2013-03-31 DIAGNOSIS — Z794 Long term (current) use of insulin: Secondary | ICD-10-CM | POA: Insufficient documentation

## 2013-03-31 DIAGNOSIS — IMO0002 Reserved for concepts with insufficient information to code with codable children: Secondary | ICD-10-CM | POA: Insufficient documentation

## 2013-03-31 DIAGNOSIS — E1065 Type 1 diabetes mellitus with hyperglycemia: Secondary | ICD-10-CM | POA: Insufficient documentation

## 2013-03-31 DIAGNOSIS — Z79899 Other long term (current) drug therapy: Secondary | ICD-10-CM | POA: Insufficient documentation

## 2013-03-31 DIAGNOSIS — Z8719 Personal history of other diseases of the digestive system: Secondary | ICD-10-CM | POA: Insufficient documentation

## 2013-03-31 DIAGNOSIS — R52 Pain, unspecified: Secondary | ICD-10-CM | POA: Insufficient documentation

## 2013-03-31 DIAGNOSIS — G8929 Other chronic pain: Secondary | ICD-10-CM | POA: Insufficient documentation

## 2013-03-31 DIAGNOSIS — R000 Tachycardia, unspecified: Secondary | ICD-10-CM | POA: Insufficient documentation

## 2013-03-31 DIAGNOSIS — I10 Essential (primary) hypertension: Secondary | ICD-10-CM | POA: Insufficient documentation

## 2013-03-31 LAB — GLUCOSE, CAPILLARY: Glucose-Capillary: 68 mg/dL — ABNORMAL LOW (ref 70–99)

## 2013-03-31 MED ORDER — HYDROCODONE-ACETAMINOPHEN 5-325 MG PO TABS: 1.0000 | ORAL_TABLET | Freq: Four times a day (QID) | ORAL | Status: DC | PRN

## 2013-03-31 MED ORDER — PREDNISONE 20 MG PO TABS: ORAL_TABLET | ORAL | Status: DC

## 2013-03-31 MED ORDER — HYDROCODONE-ACETAMINOPHEN 5-325 MG PO TABS
2.0000 | ORAL_TABLET | Freq: Once | ORAL | Status: AC
  Administered 2013-03-31: 2 via ORAL
  Filled 2013-03-31: qty 2

## 2013-03-31 NOTE — Discharge Instructions (Signed)
Follow up with your Neurologist for Neuropathic pain.  Follow up with PCP for Type 1 Diabetes Mellitus.  Keep scheduled appointment with Rheumatologist.  If symptoms progressively worsen, you become progressively short of breath or you develop unrelenting chest pain please return to emergency department for further evaluation. Take medications as directed. Note prednisone may cause fluctuation in your blood sugar be sure to keep close monitor on blood sugar levels. Follow up with your PCP in 24-48 hours.

## 2013-03-31 NOTE — ED Notes (Signed)
Pt presents with c/o random sharp pains all over her body. Pt was seen for the same a few days ago but has been unable to see the referral doctor. Pt is rocking back and forth and tearful in triage. Pt says the pains are constant and at random places all over her body.

## 2013-03-31 NOTE — ED Provider Notes (Signed)
CSN: [id number]     Arrival date & time 03/31/13  1644 History   First MD Initiated Contact with Patient 03/31/13 1822     Chief Complaint  Patient presents with  . Generalized Body Aches   (Consider location/radiation/quality/duration/timing/severity/associated sxs/prior Treatment) HPI 23 yo female presents with diffuse generalized sharp pains that are described as migratory and rated at 10/10. PMH significant for Type 1 DM, and Neuropathic pain. Patient states she took a nap and woke up around 3 pm today in pain. Pain is localized to mostly to arms, legs, and back, though it moves around. Patient States this is not a new symptom. She sees a neurologist, and states she has scheduled appointments with her PCP and Rheumatology next week. Patient looking for pain relief. Denies recent trauma, HA, fever/chills, CP, Dyspnea, abdominal pain, N/V/D/C, Dysuria, weakness.  Past Medical History  Diagnosis Date  . Diabetes mellitus   . Hypertension   . Diabetes type 1, uncontrolled 11/14/2011    Since age 42  . Gastroparesis    Past Surgical History  Procedure Laterality Date  . Ankle surgery    . Cholecystectomy  11/15/2011    Procedure: LAPAROSCOPIC CHOLECYSTECTOMY WITH INTRAOPERATIVE CHOLANGIOGRAM;  Surgeon: [name], MD;  Location: [facility];  Service: General;  Laterality: N/A;  . Laparoscopy  11/23/2011    Procedure: LAPAROSCOPY DIAGNOSTIC;  Surgeon: Mariella Saa, MD;  Location: [facility];  Service: General;  Laterality: N/A;  . Esophagogastroduodenoscopy  12/03/2011    Procedure: ESOPHAGOGASTRODUODENOSCOPY (EGD);  Surgeon: Theda Belfast, MD;  Location: [facility];  Service: Endoscopy;  Laterality: N/A;   Family History  Problem Relation Age of Onset  . Diabetes Mother   . Hypertension Father    History  Substance Use Topics  . Smoking status: Never Smoker   . Smokeless tobacco: Never Used  . Alcohol Use: Yes     Comment: Occasional   OB History   Grav Para Term Preterm  Abortions TAB SAB Ect Mult Living                 Review of Systems  All other systems reviewed and are negative.    Allergies  Peanut-containing drug products  Home Medications   Current Outpatient Rx  Name  Route  Sig  Dispense  Refill  . citalopram (CELEXA) 20 MG tablet   Oral   Take 20 mg by mouth at bedtime.         . dicyclomine (BENTYL) 10 MG capsule   Oral   Take 10 mg by mouth 4 (four) times daily -  before meals and at bedtime.         . enalapril (VASOTEC) 10 MG tablet   Oral   Take 10 mg by mouth daily.         Marland Kitchen gabapentin (NEURONTIN) 300 MG capsule   Oral   Take 4 capsules (1,200 mg total) by mouth 3 (three) times daily.   360 capsule   5   . insulin glargine (LANTUS) 100 UNIT/ML injection   Subcutaneous   Inject 40 Units into the skin at bedtime.         . insulin lispro (HUMALOG) 100 UNIT/ML injection   Subcutaneous   Inject 0-15 Units into the skin 4 (four) times daily -  before meals and at bedtime. sliding scale: CBG 70 - 120: 0 uunits: CBG 121 - 150: 2 units; CBG 151 - 200: 3 units; CBG 201 - 250: 5 uunits; CBG  251 - 300: 8 units;CBG 301 - 350: 11 units; CBG 351 - 400: 15 units; CBG > 400 Call MD         . metoCLOPramide (REGLAN) 10 MG tablet   Oral   Take 1 tablet (10 mg total) by mouth 4 (four) times daily.   120 tablet   0   . naproxen (NAPROSYN) 500 MG tablet   Oral   Take 500 mg by mouth every 12 (twelve) hours as needed for mild pain or moderate pain.          Marland Kitchen oxyCODONE-acetaminophen (PERCOCET/ROXICET) 5-325 MG per tablet   Oral   Take 1 tablet by mouth every 6 (six) hours as needed for moderate pain or severe pain.         . promethazine (PHENERGAN) 25 MG tablet   Oral   Take 25 mg by mouth every 6 (six) hours as needed for nausea or vomiting. For nausea         . HYDROcodone-acetaminophen (NORCO) 5-325 MG per tablet   Oral   Take 1 tablet by mouth every 6 (six) hours as needed for moderate pain.   6 tablet    0   . predniSONE (DELTASONE) 20 MG tablet      2 tabs po daily x 4 days   10 tablet   0    BP 132/82  Pulse 112  Temp(Src) 98.5 F (36.9 C) (Oral)  Resp 18  SpO2 100%  LMP 02/14/2013 Physical Exam  Nursing note and vitals reviewed. Constitutional: She is oriented to person, place, and time. She appears well-developed and well-nourished. No distress.  HENT:  Head: Normocephalic and atraumatic.  Eyes: Conjunctivae and EOM are normal. Pupils are equal, round, and reactive to light. No scleral icterus.  Neck: Normal range of motion. Neck supple.  Cardiovascular: Regular rhythm and normal heart sounds.  Tachycardia present.  Exam reveals no gallop and no friction rub.   No murmur heard. Pulmonary/Chest: Effort normal and breath sounds normal. No respiratory distress. She has no wheezes. She has no rales.  Abdominal: Soft. Bowel sounds are normal. She exhibits no distension and no mass. There is no tenderness. There is no rebound and no guarding.  Musculoskeletal: Normal range of motion. She exhibits no edema.  Neurological: She is alert and oriented to person, place, and time. She has normal strength. No sensory deficit. Coordination normal.  Skin: Skin is warm and dry. She is not diaphoretic.  Psychiatric: She has a normal mood and affect. Her behavior is normal.    ED Course  Procedures (including critical care time) Labs Review Labs Reviewed  GLUCOSE, CAPILLARY - Abnormal; Notable for the following:    Glucose-Capillary 68 (*)    All other components within normal limits   Imaging Review No results found.  EKG Interpretation   None       MDM   1. Chronic generalized pain    Patient has tachycardia. Tachycardia noted on several past visits. CBG 68, patient asymptomatic and noted to be eating grahm cracker/peanut butter in room. Patient appears in NAD. Rest of patient's exam is completely benign. Plan to discharge patient home with close follow up with PCP. Informed  patient to keep appointment with Rheumatologist. Recommend followup with Neurologist for neuropathic pain. If symptoms progressively worsen, you become progressively short of breath or you develop unrelenting chest pain please return to emergency department for further evaluation. Take medications as directed. Note prednisone may cause fluctuation in your blood  sugar be sure to keep close monitor on blood sugar levels. Follow up with your PCP in 24-48 hours.    Patient discussed with Dr. Juleen ChinaKohut.       Allen NorrisJacob Gray ElginLackey, PA-C 04/01/13 [phone]

## 2013-03-31 NOTE — ED Notes (Signed)
Gave crackers to pt. Notified RN

## 2013-04-03 NOTE — ED Provider Notes (Signed)
Medical screening examination/treatment/procedure(s) were performed by non-physician practitioner and as supervising physician I was immediately available for consultation/collaboration.  EKG Interpretation   None        Virgel Manifold, MD 04/03/13 [phone]

## 2013-04-14 ENCOUNTER — Telehealth: Payer: Self-pay | Admitting: Nurse Practitioner

## 2013-04-14 ENCOUNTER — Other Ambulatory Visit: Payer: Self-pay | Admitting: Nurse Practitioner

## 2013-04-14 DIAGNOSIS — M2550 Pain in unspecified joint: Secondary | ICD-10-CM

## 2013-04-14 DIAGNOSIS — E1140 Type 2 diabetes mellitus with diabetic neuropathy, unspecified: Secondary | ICD-10-CM

## 2013-04-14 NOTE — Telephone Encounter (Signed)
Spoke with patient and she said that she saw her rheumatologist today, test will not be back until 2 wks, suggested pain mgmt. clinic

## 2013-04-14 NOTE — Telephone Encounter (Signed)
I have entered a referral to pain management clinic.  Please notify patient.

## 2013-04-14 NOTE — Telephone Encounter (Signed)
Needs to speak with [name] about getting a referral to a pain mgmt clinic

## 2013-04-15 ENCOUNTER — Encounter: Payer: Self-pay | Admitting: Diagnostic Neuroimaging

## 2013-04-15 NOTE — Telephone Encounter (Signed)
Called patient to inform of pain management referral

## 2013-04-16 ENCOUNTER — Emergency Department (HOSPITAL_COMMUNITY)
Admission: EM | Admit: 2013-04-16 | Discharge: 2013-04-16 | Disposition: A | Discharge status: Home / Self Care | Payer: 59 | Attending: Emergency Medicine | Admitting: Emergency Medicine

## 2013-04-16 ENCOUNTER — Encounter (HOSPITAL_COMMUNITY): Payer: Self-pay | Admitting: Emergency Medicine

## 2013-04-16 DIAGNOSIS — K3184 Gastroparesis: Secondary | ICD-10-CM | POA: Insufficient documentation

## 2013-04-16 DIAGNOSIS — I10 Essential (primary) hypertension: Secondary | ICD-10-CM | POA: Insufficient documentation

## 2013-04-16 DIAGNOSIS — IMO0002 Reserved for concepts with insufficient information to code with codable children: Secondary | ICD-10-CM | POA: Insufficient documentation

## 2013-04-16 DIAGNOSIS — E1065 Type 1 diabetes mellitus with hyperglycemia: Secondary | ICD-10-CM | POA: Insufficient documentation

## 2013-04-16 DIAGNOSIS — E1049 Type 1 diabetes mellitus with other diabetic neurological complication: Secondary | ICD-10-CM | POA: Insufficient documentation

## 2013-04-16 DIAGNOSIS — Z79899 Other long term (current) drug therapy: Secondary | ICD-10-CM | POA: Insufficient documentation

## 2013-04-16 DIAGNOSIS — E1140 Type 2 diabetes mellitus with diabetic neuropathy, unspecified: Secondary | ICD-10-CM

## 2013-04-16 DIAGNOSIS — Z794 Long term (current) use of insulin: Secondary | ICD-10-CM | POA: Insufficient documentation

## 2013-04-16 DIAGNOSIS — E1142 Type 2 diabetes mellitus with diabetic polyneuropathy: Secondary | ICD-10-CM | POA: Insufficient documentation

## 2013-04-16 DIAGNOSIS — G8929 Other chronic pain: Secondary | ICD-10-CM | POA: Insufficient documentation

## 2013-04-16 LAB — GLUCOSE, CAPILLARY: Glucose-Capillary: 146 mg/dL — ABNORMAL HIGH (ref 70–99)

## 2013-04-16 MED ORDER — ONDANSETRON 4 MG PO TBDP
4.0000 mg | ORAL_TABLET | Freq: Once | ORAL | Status: AC
  Administered 2013-04-16: 4 mg via ORAL
  Filled 2013-04-16: qty 1

## 2013-04-16 MED ORDER — GABAPENTIN 400 MG PO CAPS: ORAL_CAPSULE | ORAL | Status: DC

## 2013-04-16 MED ORDER — HYDROMORPHONE HCL PF 1 MG/ML IJ SOLN
1.0000 mg | Freq: Once | INTRAMUSCULAR | Status: AC
Start: 2013-04-16 — End: 2013-04-16
  Administered 2013-04-16: 1 mg via INTRAMUSCULAR
  Filled 2013-04-16: qty 1

## 2013-04-16 MED ORDER — OXYCODONE-ACETAMINOPHEN 5-325 MG PO TABS: 2.0000 | ORAL_TABLET | ORAL | Status: DC | PRN

## 2013-04-16 NOTE — ED Provider Notes (Signed)
CSN: [id number]     Arrival date & time 04/16/13  0617 History   First MD Initiated Contact with Patient 04/16/13 [phone]     Chief Complaint  Patient presents with  . Generalized Body Aches    HPI  Patient presents with leg pain. She has a history of diabetes since age 23. Has a diagnosis of diabetic neuropathy. Has chronic lower extremity pain from this. Has also been having episodes of recent flares of specific joint pain and has had testing done actually yesterday a rheumatologist for evaluation for possible rheumatoid arthritis. Her pain today is in a stocking distribution to her lower extremities. From her distal thighs and in downward. Blood sugars have been good. She states last week it is less than 200. Has not been having polyuria. No fluid also vomiting or diarrhea.  Past Medical History  Diagnosis Date  . Diabetes mellitus   . Hypertension   . Diabetes type 1, uncontrolled 11/14/2011    Since age 45  . Gastroparesis    Past Surgical History  Procedure Laterality Date  . Ankle surgery    . Cholecystectomy  11/15/2011    Procedure: LAPAROSCOPIC CHOLECYSTECTOMY WITH INTRAOPERATIVE CHOLANGIOGRAM;  Surgeon: [name], MD;  Location: [facility];  Service: General;  Laterality: N/A;  . Laparoscopy  11/23/2011    Procedure: LAPAROSCOPY DIAGNOSTIC;  Surgeon: Mariella Saa, MD;  Location: [facility];  Service: General;  Laterality: N/A;  . Esophagogastroduodenoscopy  12/03/2011    Procedure: ESOPHAGOGASTRODUODENOSCOPY (EGD);  Surgeon: Theda Belfast, MD;  Location: [facility];  Service: Endoscopy;  Laterality: N/A;   Family History  Problem Relation Age of Onset  . Diabetes Mother   . Hypertension Father    History  Substance Use Topics  . Smoking status: Never Smoker   . Smokeless tobacco: Never Used  . Alcohol Use: Yes     Comment: Occasional   OB History   Grav Para Term Preterm Abortions TAB SAB Ect Mult Living                 Review of Systems  Constitutional:  Negative for fever, chills, diaphoresis, appetite change and fatigue.  HENT: Negative for mouth sores, sore throat and trouble swallowing.   Eyes: Negative for visual disturbance.  Respiratory: Negative for cough, chest tightness, shortness of breath and wheezing.   Cardiovascular: Negative for chest pain.  Gastrointestinal: Negative for nausea, vomiting, abdominal pain, diarrhea and abdominal distention.  Endocrine: Negative for polydipsia, polyphagia and polyuria.  Genitourinary: Negative for dysuria, frequency and hematuria.  Musculoskeletal: Positive for myalgias. Negative for gait problem.       Neuropathic pain  Skin: Negative for color change, pallor and rash.  Neurological: Negative for dizziness, syncope, light-headedness and headaches.  Hematological: Does not bruise/bleed easily.  Psychiatric/Behavioral: Negative for behavioral problems and confusion.    Allergies  Peanut-containing drug products  Home Medications   Current Outpatient Rx  Name  Route  Sig  Dispense  Refill  . citalopram (CELEXA) 20 MG tablet   Oral   Take 20 mg by mouth at bedtime.         . dicyclomine (BENTYL) 10 MG capsule   Oral   Take 10 mg by mouth 4 (four) times daily -  before meals and at bedtime.         . enalapril (VASOTEC) 10 MG tablet   Oral   Take 10 mg by mouth daily.         Marland Kitchen  gabapentin (NEURONTIN) 300 MG capsule   Oral   Take 4 capsules (1,200 mg total) by mouth 3 (three) times daily.   360 capsule   5   . HYDROcodone-acetaminophen (NORCO) 5-325 MG per tablet   Oral   Take 1 tablet by mouth every 6 (six) hours as needed for moderate pain.   6 tablet   0   . insulin glargine (LANTUS) 100 UNIT/ML injection   Subcutaneous   Inject 40 Units into the skin at bedtime.         . insulin lispro (HUMALOG) 100 UNIT/ML injection   Subcutaneous   Inject 5-20 Units into the skin 4 (four) times daily -  before meals and at bedtime. Inject 5 units (base) and additional units  according to sliding scale- sliding scale: CBG 70 - 120: 0 uunits: CBG 121 - 150: 2 units; CBG 151 - 200: 3 units; CBG 201 - 250: 5 uunits; CBG 251 - 300: 8 units;CBG 301 - 350: 11 units; CBG 351 - 400: 15 units; CBG > 400 Call MD         . metoCLOPramide (REGLAN) 10 MG tablet   Oral   Take 1 tablet (10 mg total) by mouth 4 (four) times daily.   120 tablet   0   . oxyCODONE-acetaminophen (PERCOCET/ROXICET) 5-325 MG per tablet   Oral   Take 1 tablet by mouth every 6 (six) hours as needed for moderate pain or severe pain.         . promethazine (PHENERGAN) 25 MG tablet   Oral   Take 25 mg by mouth every 6 (six) hours as needed for nausea or vomiting. For nausea         . gabapentin (NEURONTIN) 400 MG capsule      1po qam, 1 po noon, 2 po qpm   100 capsule   0   . oxyCODONE-acetaminophen (PERCOCET/ROXICET) 5-325 MG per tablet   Oral   Take 2 tablets by mouth every 4 (four) hours as needed.   6 tablet   0   . predniSONE (DELTASONE) 20 MG tablet      2 tabs po daily x 4 days   10 tablet   0    BP 115/75  Pulse 130  Temp(Src) 98.2 F (36.8 C) (Oral)  Resp 18  SpO2 100%  LMP 02/14/2013 Physical Exam  Constitutional: She is oriented to person, place, and time. She appears well-developed and well-nourished. No distress.  HENT:  Head: Normocephalic.  Eyes: Conjunctivae are normal. Pupils are equal, round, and reactive to light. No scleral icterus.  Neck: Normal range of motion. Neck supple. No thyromegaly present.  Cardiovascular: Normal rate and regular rhythm.  Exam reveals no gallop and no friction rub.   No murmur heard. Pulse 103. Regular.  Pulmonary/Chest: Effort normal and breath sounds normal. No respiratory distress. She has no wheezes. She has no rales.  Abdominal: Soft. Bowel sounds are normal. She exhibits no distension. There is no tenderness. There is no rebound.  Musculoskeletal: Normal range of motion.  Symmetric exam with Lotrimin. Normal strength  and sensation. Normal pulses and cap refill. No asymmetry circumference.  Neurological: She is alert and oriented to person, place, and time.  Skin: Skin is warm and dry. No rash noted.  Psychiatric: She has a normal mood and affect. Her behavior is normal.    ED Course  Procedures (including critical care time) Labs Review Labs Reviewed - No data to display Imaging Review No  results found.  EKG Interpretation   None       MDM   1. Diabetic neuropathy    Resting heart rate it checked at 1:30. The bedside is 103. Blood sugar this morning and 1.1. Has not been vomiting. No dehydration. Mild nausea now. Her pain is in her lower extremities in a stocking distribution. No joint pain or swelling. I think this is a chronic exacerbation of her neuropathic pain from her long-standing diabetes. She does follow with her primary care physician. Astra to continue to do so for her ongoing pain care needs. She takes 3 400 mg Neurontin tablets a day. Bastard increases to 4 for one week then 5 for one week and 6. Mr. call her primary care physician to inform her of this medicine change. Recheck here if any worsening symptoms.  Rolland Porter, MD 04/16/13 [phone]

## 2013-04-16 NOTE — Discharge Instructions (Signed)
Increase neurontin as discussed.  Diabetic Neuropathy Diabetic neuropathy is a nerve disease or nerve damage that is caused by diabetes mellitus. About half of all people with diabetes mellitus have some form of nerve damage. Nerve damage is more common in those who have had diabetes mellitus for many years and who generally have not had good control of their blood sugar (glucose) level. Diabetic neuropathy is a common complication of diabetes mellitus. There are three more common types of diabetic neuropathy and a fourth type that is less common and less understood:   Peripheral neuropathy This is the most common type of diabetic neuropathy. It causes damage to the nerves of the feet and legs first and then eventually the hands and arms.The damage affects the ability to sense touch.  Autonomic neuropathy This type causes damage to the autonomic nervous system, which controls the following functions:  Heartbeat.  Body temperature.  Blood pressure.  Urination.  Digestion.  Sweating.  Sexual function.  Focal neuropathy Focal neuropathy can be painful and unpredictable and occurs most often in older adults with diabetes mellitus. It involves a specific nerve or one area and often comes on suddenly. It usually does not cause long-term problems.  Radiculoplexus neuropathy Sometimes called lumbosacral radiculoplexus neuropathy, radiculoplexus neuropathy affects the nerves of the thighs, hips, buttocks, or legs. It is more common in people with type 2 diabetes mellitus and in older men. It is characterized by debilitating pain, weakness, and atrophy, usually in the thigh muscles. CAUSES  The cause of peripheral, autonomic, and focal neuropathies is diabetes mellitus that is uncontrolled and high glucose levels. The cause of radiculoplexus neuropathy is unknown. However, it is thought to be caused by inflammation related to uncontrolled glucose levels. SIGNS AND SYMPTOMS  Peripheral  Neuropathy Peripheral neuropathy develops slowly over time. When the nerves of the feet and legs no longer work there may be:   Burning, stabbing, or aching pain in the legs or feet.  Inability to feel pressure or pain in your feet. This can lead to:  Thick calluses over pressure areas.  Pressure sores.  Ulcers.  Foot deformities.  Reduced ability to feel temperature changes.  Muscle weakness. Autonomic Neuropathy The symptoms of autonomic neuropathy vary depending on which nerves are affected. Symptoms may include:  Problems with digestion, such as:  Feeling sick to your stomach (nausea).  Vomiting.  Bloating.  Constipation.  Diarrhea.  Abdominal pain.  Difficulty with urination. This occurs if you lose your ability to sense when your bladder is full. Problems include:  Urine leakage (incontinence).  Inability to empty your bladder completely (retention).  Rapid or irregular heartbeat (palpitations).  Blood pressure drops when you stand up (orthostatic hypotension). When you stand up you may feel:  Dizzy.  Weak.  Faint.  In men, inability to attain and maintain an erection.  In women, vaginal dryness and problems with decreased sexual desire and arousal.  Problems with body temperature regulation.  Increased or decreased sweating. Focal Neuropathy  Abnormal eye movements or abnormal alignment of both eyes.  Weakness in the wrist.  Foot drop. This results in an inability to lift the foot properly and abnormal walking or foot movement.  Paralysis on one side of your face (Bell palsy).  Chest or abdominal pain. Radiculoplexus Neuropathy  Sudden, severe pain in your hip, thigh, or buttocks.  Weakness and wasting of thigh muscles.  Difficulty rising from a seated position.  Abdominal swelling.  Unexplained weight loss (usually more than 10 lb [4.5  kg]). DIAGNOSIS  Peripheral Neuropathy Your senses may be tested. Sensory function testing  can be done with:  A light touch using a monofilament.  A vibration with tuning fork.  A sharp sensation with a pin prick. Other tests that can help diagnose neuropathy are:  Nerve conduction velocity. This test checks the transmission of an electrical current through a nerve.  Electromyography. This shows how muscles respond to electrical signals transmitted by nearby nerves.  Quantitative sensory testing. This is used to assess how your nerves respond to vibrations and changes in temperature. Autonomic Neuropathy Diagnosis is often based on reported symptoms. Tell your health care provider if you experience:   Dizziness.   Constipation.   Diarrhea.   Inappropriate urination or inability to urinate.   Inability to get or maintain an erection.  Tests that may be done include:   Electrocardiography or Holter monitor. These are tests that can help show problems with the heart rate or heart rhythm.   An X-ray exam may be done. Focal Neuropathy Diagnosis is made based on your symptoms and what your health care provider finds during your exam. Other tests may be done. They may include:  Nerve conduction velocities. This checks the transmission of electrical current through a nerve.  Electromyography. This shows how muscles respond to electrical signals transmitted by nearby nerves.  Quantitative sensory testing. This test is used to assess how your nerves respond to vibration and changes in temperature. Radiculoplexus Neuropathy  Often the first thing is to eliminate any other issue or problems that might be the cause, as there is no stick test for diagnosis.  X-ray exam of your spine and lumbar region.  Spinal tap to rule out cancer.  MRI to rule out other lesions. TREATMENT  Once nerve damage occurs, it cannot be reversed. The goal of treatment is to keep the disease or nerve damage from getting worse and affecting more nerve fibers. Controlling your blood glucose  level is the key. Most people with radiculoplexus neuropathy see at least a partial improvement over time. You will need to keep your blood glucose and HbA1c levels in the target range determined by your health care provider. Things that help control blood glucose levels include:   Blood glucose monitoring.   Meal planning.   Physical activity.   Diabetes medicine.  Over time, maintaining lower blood glucose levels helps lessen symptoms. Sometimes, prescription pain medicine is needed. HOME CARE INSTRUCTIONS:  Do not smoke.  Keep your blood glucose level in the range that you and your health care provider have determined acceptable for you.  Keep your blood pressure level in the range that you and your health care provider have determined acceptable for you.  Eat a well-balanced diet.  Be active every day.  Check your feet every day. SEEK MEDICAL CARE IF:   You have burning, stabbing, or aching pain in the legs or feet.  You are unable to feel pressure or pain in your feet.  You develop problems with digestion such as:  Nausea.  Vomiting.  Bloating.  Constipation.  Diarrhea.  Abdominal pain.  You have difficulty with urination, such as:  Incontinence.  Retention.  You have palpitations.  You develop orthostatic hypotension. When you stand up you may feel:  Dizzy.  Weak.  Faint.  You cannot attain and maintain an erection (in men).  You have vaginal dryness and problems with decreased sexual desire and arousal (in women).  You have severe pain in your thighs, legs,  or buttocks.  You have unexplained weight loss. Document Released: 05/26/2001 Document Revised: 01/05/2013 Document Reviewed: 08/26/2012 [facility] Patient Information 2014 [employer], [place].

## 2013-04-16 NOTE — ED Notes (Signed)
Pt arrived to the ED with a complaint of generalized body aches.  Pt states that she is being worked up by a neurologist for  either RA or Lupus.  Pt states the greatest pain is in her legs.  Pt states that she take gabapentin for the last year but nothing else.

## 2013-04-17 ENCOUNTER — Encounter (HOSPITAL_COMMUNITY): Payer: Self-pay | Admitting: Emergency Medicine

## 2013-04-17 ENCOUNTER — Emergency Department (HOSPITAL_COMMUNITY)
Admission: EM | Admit: 2013-04-17 | Discharge: 2013-04-17 | Disposition: A | Discharge status: Home / Self Care | Payer: 59 | Attending: Emergency Medicine | Admitting: Emergency Medicine

## 2013-04-17 DIAGNOSIS — E1065 Type 1 diabetes mellitus with hyperglycemia: Secondary | ICD-10-CM | POA: Insufficient documentation

## 2013-04-17 DIAGNOSIS — Z79899 Other long term (current) drug therapy: Secondary | ICD-10-CM | POA: Insufficient documentation

## 2013-04-17 DIAGNOSIS — Z9089 Acquired absence of other organs: Secondary | ICD-10-CM | POA: Insufficient documentation

## 2013-04-17 DIAGNOSIS — IMO0002 Reserved for concepts with insufficient information to code with codable children: Secondary | ICD-10-CM | POA: Insufficient documentation

## 2013-04-17 DIAGNOSIS — G8929 Other chronic pain: Secondary | ICD-10-CM | POA: Insufficient documentation

## 2013-04-17 DIAGNOSIS — I10 Essential (primary) hypertension: Secondary | ICD-10-CM | POA: Insufficient documentation

## 2013-04-17 DIAGNOSIS — Z8719 Personal history of other diseases of the digestive system: Secondary | ICD-10-CM | POA: Insufficient documentation

## 2013-04-17 DIAGNOSIS — R21 Rash and other nonspecific skin eruption: Secondary | ICD-10-CM | POA: Insufficient documentation

## 2013-04-17 DIAGNOSIS — M549 Dorsalgia, unspecified: Secondary | ICD-10-CM | POA: Insufficient documentation

## 2013-04-17 DIAGNOSIS — M79609 Pain in unspecified limb: Secondary | ICD-10-CM | POA: Insufficient documentation

## 2013-04-17 DIAGNOSIS — E1049 Type 1 diabetes mellitus with other diabetic neurological complication: Secondary | ICD-10-CM | POA: Insufficient documentation

## 2013-04-17 DIAGNOSIS — Z794 Long term (current) use of insulin: Secondary | ICD-10-CM | POA: Insufficient documentation

## 2013-04-17 DIAGNOSIS — E1142 Type 2 diabetes mellitus with diabetic polyneuropathy: Secondary | ICD-10-CM | POA: Insufficient documentation

## 2013-04-17 LAB — POCT I-STAT, CHEM 8
BUN: 7 mg/dL (ref 6–23)
Calcium, Ion: 1.23 mmol/L (ref 1.12–1.23)
Chloride: 94 mEq/L — ABNORMAL LOW (ref 96–112)
Creatinine, Ser: 0.4 mg/dL — ABNORMAL LOW (ref 0.50–1.10)
Glucose, Bld: 157 mg/dL — ABNORMAL HIGH (ref 70–99)
HCT: 39 % (ref 36.0–46.0)
Hemoglobin: 13.3 g/dL (ref 12.0–15.0)
Potassium: 3.3 mEq/L — ABNORMAL LOW (ref 3.7–5.3)
Sodium: 137 mEq/L (ref 137–147)
TCO2: 29 mmol/L (ref 0–100)

## 2013-04-17 LAB — SEDIMENTATION RATE: Sed Rate: 17 mm/hr (ref 0–22)

## 2013-04-17 MED ORDER — CYCLOBENZAPRINE HCL 10 MG PO TABS: 10.0000 mg | ORAL_TABLET | Freq: Three times a day (TID) | ORAL | Status: DC | PRN

## 2013-04-17 MED ORDER — POTASSIUM CHLORIDE CRYS ER 20 MEQ PO TBCR
40.0000 meq | EXTENDED_RELEASE_TABLET | Freq: Once | ORAL | Status: AC
  Administered 2013-04-17: 40 meq via ORAL
  Filled 2013-04-17: qty 2

## 2013-04-17 MED ORDER — NAPROXEN 250 MG PO TABS: 250.0000 mg | ORAL_TABLET | Freq: Two times a day (BID) | ORAL | Status: DC

## 2013-04-17 MED ORDER — CYCLOBENZAPRINE HCL 10 MG PO TABS
10.0000 mg | ORAL_TABLET | Freq: Once | ORAL | Status: AC
  Administered 2013-04-17: 10 mg via ORAL
  Filled 2013-04-17: qty 1

## 2013-04-17 MED ORDER — SODIUM CHLORIDE 0.9 % IV SOLN
1000.0000 mL | Freq: Once | INTRAVENOUS | Status: AC
  Administered 2013-04-17: 1000 mL via INTRAVENOUS

## 2013-04-17 MED ORDER — SODIUM CHLORIDE 0.9 % IV SOLN
1000.0000 mL | INTRAVENOUS | Status: DC
  Administered 2013-04-17: 1000 mL via INTRAVENOUS

## 2013-04-17 MED ORDER — KETOROLAC TROMETHAMINE 30 MG/ML IJ SOLN
30.0000 mg | Freq: Once | INTRAMUSCULAR | Status: AC
  Administered 2013-04-17: 30 mg via INTRAVENOUS
  Filled 2013-04-17: qty 1

## 2013-04-17 NOTE — ED Notes (Signed)
Bed: WA23 Expected date:  Expected time:  Means of arrival:  Comments: 

## 2013-04-17 NOTE — Discharge Instructions (Signed)
Your SED rate today is very normal and does not go along with acute inflammation of your joints. Take the naprosyn and flexeril for your body aches. Follow up with Dr Renae Gloss and Dr Dierdre Forth about your testing for arthritis. You will need to get further narcotic pain medications from your doctors who can monitor your use of them.   Chronic Pain Discharge Instructions  Emergency care providers appreciate that many patients coming to [place] are in severe pain and we wish to address their pain in the safest, most responsible manner.  It is important to recognize however, that the proper treatment of chronic pain differs from that of the pain of injuries and acute illnesses.  Our goal is to provide quality, safe, personalized care and we thank you for giving [place] the opportunity to serve you. The use of narcotics and related agents for chronic pain syndromes may lead to additional physical and psychological problems.  Nearly as many people die from prescription narcotics each year as die from car crashes.  Additionally, this risk is increased if such prescriptions are obtained from a variety of sources.  Therefore, only your primary care physician or a pain management specialist is able to safely treat such syndromes with narcotic medications long-term.    Documentation revealing such prescriptions have been sought from multiple sources may prohibit [place] from providing a refill or different narcotic medication.  Your name may be checked first through the [facility] Controlled Substances Reporting System.  This database is a record of controlled substance medication prescriptions that the patient has received.  This has been established by [facility] in an effort to eliminate the dangerous, and often life threatening, practice of obtaining multiple prescriptions from different medical providers.   If you have a chronic pain syndrome (i.e. chronic headaches, recurrent back or neck pain, dental pain, abdominal or  pelvis pain without a specific diagnosis, or neuropathic pain such as fibromyalgia) or recurrent visits for the same condition without an acute diagnosis, you may be treated with non-narcotics and other non-addictive medicines.  Allergic reactions or negative side effects that may be reported by a patient to such medications will not typically lead to the use of a narcotic analgesic or other controlled substance as an alternative.   Patients managing chronic pain with a personal physician should have provisions in place for breakthrough pain.  If you are in crisis, you should call your physician.  If your physician directs you to the emergency department, please have the doctor call and speak to our attending physician concerning your care.   When patients come to the Emergency Department (ED) with acute medical conditions in which the Emergency Department physician feels appropriate to prescribe narcotic or sedating pain medication, the physician will prescribe these in very limited quantities.  The amount of these medications will last only until you can see your primary care physician in his/her office.  Any patient who returns to the ED seeking refills should expect only non-narcotic pain medications.   In the event of an acute medical condition exists and the emergency physician feels it is necessary that the patient be given a narcotic or sedating medication -  a responsible adult driver should be present in the room prior to the medication being given by the nurse.   Prescriptions for narcotic or sedating medications that have been lost, stolen or expired will not be refilled in the Emergency Department.    Patients who have chronic pain may receive non-narcotic prescriptions until  seen by their primary care physician.  It is every patients personal responsibility to maintain active prescriptions with his or her primary care physician or specialist.

## 2013-04-17 NOTE — ED Notes (Signed)
Pt reports generalized pain, pain has been "going on for a while" 9/10. Pt reports she went to pcp on Friday and was tested for lupus or RA, has not gotten results yet. Pt reports generalized pain and lower back and leg pain. This pain episode started this morning.

## 2013-04-17 NOTE — ED Provider Notes (Signed)
CSN: [id number]     Arrival date & time 04/17/13  0920 History   First MD Initiated Contact with Patient 04/17/13 [phone]     Chief Complaint  Patient presents with  . generalized pain   . Back Pain  . Claudication   (Consider location/radiation/quality/duration/timing/severity/associated sxs/prior Treatment) HPI Patient reports she started having pain in her extremities and her back 2 months ago. The pain is diffuse and is not localized to her joints but also hurts between her joints. She states she woke up at 6 AM this morning when she had to go to the bathroom and had stabbing sharp pain but also a constant aching pain. She states she's had swollen joints in the past mainly her ankles but not today. She states she's had a rash on her back for about 4 or 5 days which her significant other states he feels is from scratching. She denies hematuria or fever. 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 She is currently on Neurontin but she's been on for one year. She has been on increased dose for about a month and is taking 400 mg in the morning, 400 mg at noon, 800 g at bedtime. She states she was given a sample of Lyrica by the rheumatologist however she only took it 3-4 days and didn't think it helped so she stopped taking it. She however just saw the rheumatologist 3 days ago. She was referred for evaluation for possible rheumatoid arthritis or lupus. She states she's had tesing done but they are pending. Patient has been coming to the ED frequently in the past month and states she ran out of her Percocet that she had been prescribed yesterday. She states they did try her on steroids however it made her blood sugar go up. She denies nausea but has had decreased appetite. She reports she had has not been to work for the past 2-3 weeks because of pain.  [place]P Dr Renae [place] Neurologist [facility] Dierdre ForthBeekman  Past Medical History  Diagnosis Date  . Diabetes mellitus   . Hypertension    . Diabetes type 1, uncontrolled 11/14/2011    Since age 23  . Gastroparesis    Past Surgical History  Procedure Laterality Date  . Ankle surgery    . Cholecystectomy  11/15/2011    Procedure: LAPAROSCOPIC CHOLECYSTECTOMY WITH INTRAOPERATIVE CHOLANGIOGRAM;  Surgeon: Ardeth SportsmanSteven C. Gross, MD;  Location: [facility];  Service: General;  Laterality: N/A;  . Laparoscopy  11/23/2011    Procedure: LAPAROSCOPY DIAGNOSTIC;  Surgeon: Mariella SaaBenjamin T Hoxworth, MD;  Location: [facility];  Service: General;  Laterality: N/A;  . Esophagogastroduodenoscopy  12/03/2011    Procedure: ESOPHAGOGASTRODUODENOSCOPY (EGD);  Surgeon: Theda BelfastPatrick D Hung, MD;  [facility];  Service: Endoscopy;  Laterality: N/A;   Family History  Problem Relation Age of Onset  . Diabetes Mother   . Hypertension Father    History  Substance Use Topics  . Smoking status: Never Smoker   . Smokeless tobacco: Never Used  . Alcohol Use: Yes     Comment: Occasional   Unemployed, was working until 3 weeks ago Drinks alcohol every other month  OB History   Grav Para Term Preterm Abortions TAB SAB Ect Mult Living                 Review of Systems  All other systems reviewed and are negative.    Allergies  Peanut-containing drug products  Home Medications   Current Outpatient Rx  Name  Route  Sig  Dispense  Refill  . citalopram (CELEXA) 20 MG tablet   Oral   Take 20 mg by mouth at bedtime.         . dicyclomine (BENTYL) 10 MG capsule   Oral   Take 10 mg by mouth 4 (four) times daily -  before meals and at bedtime.         . enalapril (VASOTEC) 10 MG tablet   Oral   Take 10 mg by mouth daily.         Marland Kitchen gabapentin (NEURONTIN) 400 MG capsule   Oral   Take 400 mg by mouth 3 (three) times daily. 400 mg in the morning 400 mg at noon and 800 mg at bedtime         . HYDROcodone-acetaminophen (NORCO) 5-325 MG per tablet   Oral   Take 1 tablet by mouth every 6 (six) hours as needed for moderate pain.   6 tablet   0   .  insulin glargine (LANTUS) 100 UNIT/ML injection   Subcutaneous   Inject 40 Units into the skin at bedtime.         . insulin lispro (HUMALOG) 100 UNIT/ML injection   Subcutaneous   Inject 5-20 Units into the skin 4 (four) times daily -  before meals and at bedtime. Inject 5 units (base) and additional units according to sliding scale- sliding scale: CBG 70 - 120: 0 uunits: CBG 121 - 150: 2 units; CBG 151 - 200: 3 units; CBG 201 - 250: 5 uunits; CBG 251 - 300: 8 units;CBG 301 - 350: 11 units; CBG 351 - 400: 15 units; CBG > 400 Call MD         . metoCLOPramide (REGLAN) 10 MG tablet   Oral   Take 1 tablet (10 mg total) by mouth 4 (four) times daily.   120 tablet   0   . oxyCODONE-acetaminophen (PERCOCET/ROXICET) 5-325 MG per tablet   Oral   Take 2 tablets by mouth every 4 (four) hours as needed.   6 tablet   0   . promethazine (PHENERGAN) 25 MG tablet   Oral   Take 25 mg by mouth every 6 (six) hours as needed for nausea or vomiting. For nausea         . predniSONE (DELTASONE) 20 MG tablet      2 tabs po daily x 4 days   10 tablet   0    BP 119/83  Pulse 126  Temp(Src) 98 F (36.7 C) (Oral)  SpO2 100%  LMP 02/14/2013  Laboratory interpretation all normal except tachycardia  Physical Exam  Nursing note and vitals reviewed. Constitutional: She is oriented to person, place, and time. She appears well-developed and well-nourished.  Non-toxic appearance. She does not appear ill. No distress.  HENT:  Head: Normocephalic and atraumatic.  Right Ear: External ear normal.  Left Ear: External ear normal.  Nose: Nose normal. No mucosal edema or rhinorrhea.  Mouth/Throat: Mucous membranes are normal. No dental abscesses or uvula swelling.  Dry tongue  Eyes: Conjunctivae and EOM are normal. Pupils are equal, round, and reactive to light.  Neck: Normal range of motion and full passive range of motion without pain. Neck supple.  Cardiovascular: Normal rate, regular rhythm and  normal heart sounds.  Exam reveals no gallop and no friction rub.   No murmur heard. Pulmonary/Chest: Effort normal and breath sounds normal. No respiratory distress. She has no wheezes. She has no rhonchi. She has no  rales. She exhibits no tenderness and no crepitus.  Abdominal: Soft. Normal appearance and bowel sounds are normal. She exhibits no distension. There is no tenderness. There is no rebound and no guarding.  Musculoskeletal: Normal range of motion. She exhibits no edema and no tenderness.  Moves all extremities well. Patient has no obvious swelling or thickening of her joints including her hands wrists ankles toes.  Neurological: She is alert and oriented to person, place, and time. She has normal strength. No cranial nerve deficit.  Skin: Skin is warm, dry and intact. No rash noted. No erythema. No pallor.  Psychiatric: Her speech is normal and behavior is normal. Her mood appears not anxious.  Flat facies     ED Course  Procedures (including critical care time)  Medications  0.9 %  sodium chloride infusion (0 mLs Intravenous Stopped 04/17/13 1206)    Followed by  0.9 %  sodium chloride infusion (1,000 mLs Intravenous New Bag/Given 04/17/13 1053)  cyclobenzaprine (FLEXERIL) tablet 10 mg (not administered)  ketorolac (TORADOL) 30 MG/ML injection 30 mg (30 mg Intravenous Given 04/17/13 1050)  potassium chloride SA (K-DUR,KLOR-CON) CR tablet 40 mEq (40 mEq Oral Given 04/17/13 1209)    Review of her prior visits shows she has been here 6 times since December 17. She was seen here December 17, December 18, December 27, December 30, January 1, and January 17. She is are run out of the Percocet she was prescribed 2 days ago. All of her visits are for joint pains and arthralgias.  Patient was given potassium for her mild hypokalemia. Patient's sedimentation rate is normal at 17. This does not go along with any acute rheumatological disorder including lupus. Patient will be treated with  nonsteroidal medications at a low dose due to her long-standing diabetes, and a muscle relaxer.  Labs Review Results for orders placed during the hospital encounter of 04/17/13  SEDIMENTATION RATE      Result Value Range   Sed Rate 17  0 - 22 mm/hr  POCT I-STAT, CHEM 8      Result Value Range   Sodium 137  137 - 147 mEq/L   Potassium 3.3 (*) 3.7 - 5.3 mEq/L   Chloride 94 (*) 96 - 112 mEq/L   BUN 7  6 - 23 mg/dL   Creatinine, Ser 6.81 (*) 0.50 - 1.10 mg/dL   Glucose, Bld 157 (*) 70 - 99 mg/dL   Calcium, Ion 2.62  0.35 - 1.23 mmol/L   TCO2 29  0 - 100 mmol/L   Hemoglobin 13.3  12.0 - 15.0 g/dL   HCT 59.7  41.6 - 38.4 %     Laboratory interpretation all normal except hypokalemia    Imaging ReviewNo results found.  EKG Interpretation   None       MDM   1. Chronic pain     New Prescriptions   CYCLOBENZAPRINE (FLEXERIL) 10 MG TABLET    Take 1 tablet (10 mg total) by mouth 3 (three) times daily as needed for muscle spasms (muscle pain).   NAPROXEN (NAPROSYN) 250 MG TABLET    Take 1 tablet (250 mg total) by mouth 2 (two) times daily.    Plan discharge   Devoria Albe, MD, Franz Dell, MD 04/17/13 [phone]

## 2013-04-20 ENCOUNTER — Telehealth: Payer: Self-pay | Admitting: Diagnostic Neuroimaging

## 2013-04-20 NOTE — Telephone Encounter (Signed)
Patient recently saw rheumatologist - blood work was negative - rheumatologist said to follow up with neurology before April appointment. Please call to advise.

## 2013-04-20 NOTE — Telephone Encounter (Signed)
Patient has been sched/confirmed for sooner appt per her request

## 2013-04-22 ENCOUNTER — Emergency Department (HOSPITAL_COMMUNITY)
Admission: EM | Admit: 2013-04-22 | Discharge: 2013-04-22 | Disposition: A | Discharge status: Home / Self Care | Payer: 59 | Attending: Emergency Medicine | Admitting: Emergency Medicine

## 2013-04-22 ENCOUNTER — Emergency Department (HOSPITAL_COMMUNITY): Payer: 59

## 2013-04-22 ENCOUNTER — Encounter (HOSPITAL_COMMUNITY): Payer: Self-pay | Admitting: Emergency Medicine

## 2013-04-22 DIAGNOSIS — Z791 Long term (current) use of non-steroidal anti-inflammatories (NSAID): Secondary | ICD-10-CM | POA: Insufficient documentation

## 2013-04-22 DIAGNOSIS — R5381 Other malaise: Secondary | ICD-10-CM | POA: Insufficient documentation

## 2013-04-22 DIAGNOSIS — R000 Tachycardia, unspecified: Secondary | ICD-10-CM | POA: Insufficient documentation

## 2013-04-22 DIAGNOSIS — I10 Essential (primary) hypertension: Secondary | ICD-10-CM | POA: Insufficient documentation

## 2013-04-22 DIAGNOSIS — G589 Mononeuropathy, unspecified: Secondary | ICD-10-CM | POA: Insufficient documentation

## 2013-04-22 DIAGNOSIS — R5383 Other fatigue: Secondary | ICD-10-CM | POA: Insufficient documentation

## 2013-04-22 DIAGNOSIS — Z3202 Encounter for pregnancy test, result negative: Secondary | ICD-10-CM | POA: Insufficient documentation

## 2013-04-22 DIAGNOSIS — IMO0002 Reserved for concepts with insufficient information to code with codable children: Secondary | ICD-10-CM | POA: Insufficient documentation

## 2013-04-22 DIAGNOSIS — R002 Palpitations: Secondary | ICD-10-CM

## 2013-04-22 DIAGNOSIS — R52 Pain, unspecified: Secondary | ICD-10-CM | POA: Insufficient documentation

## 2013-04-22 DIAGNOSIS — Z794 Long term (current) use of insulin: Secondary | ICD-10-CM | POA: Insufficient documentation

## 2013-04-22 DIAGNOSIS — Z9089 Acquired absence of other organs: Secondary | ICD-10-CM | POA: Insufficient documentation

## 2013-04-22 DIAGNOSIS — E1065 Type 1 diabetes mellitus with hyperglycemia: Secondary | ICD-10-CM | POA: Insufficient documentation

## 2013-04-22 DIAGNOSIS — Z79899 Other long term (current) drug therapy: Secondary | ICD-10-CM | POA: Insufficient documentation

## 2013-04-22 DIAGNOSIS — Z8719 Personal history of other diseases of the digestive system: Secondary | ICD-10-CM | POA: Insufficient documentation

## 2013-04-22 DIAGNOSIS — G629 Polyneuropathy, unspecified: Secondary | ICD-10-CM

## 2013-04-22 DIAGNOSIS — Z9889 Other specified postprocedural states: Secondary | ICD-10-CM | POA: Insufficient documentation

## 2013-04-22 LAB — CBC WITH DIFFERENTIAL/PLATELET
Basophils Absolute: 0 10*3/uL (ref 0.0–0.1)
Basophils Relative: 0 % (ref 0–1)
Eosinophils Absolute: 0.1 10*3/uL (ref 0.0–0.7)
Eosinophils Relative: 1 % (ref 0–5)
HCT: 41.2 % (ref 36.0–46.0)
Hemoglobin: 14.5 g/dL (ref 12.0–15.0)
Lymphocytes Relative: 44 % (ref 12–46)
Lymphs Abs: 2.3 10*3/uL (ref 0.7–4.0)
MCH: 30.1 pg (ref 26.0–34.0)
MCHC: 35.2 g/dL (ref 30.0–36.0)
MCV: 85.7 fL (ref 78.0–100.0)
Monocytes Absolute: 0.2 10*3/uL (ref 0.1–1.0)
Monocytes Relative: 4 % (ref 3–12)
Neutro Abs: 2.6 10*3/uL (ref 1.7–7.7)
Neutrophils Relative %: 50 % (ref 43–77)
Platelets: 230 10*3/uL (ref 150–400)
RBC: 4.81 MIL/uL (ref 3.87–5.11)
RDW: 11.7 % (ref 11.5–15.5)
WBC: 5.2 10*3/uL (ref 4.0–10.5)

## 2013-04-22 LAB — COMPREHENSIVE METABOLIC PANEL
ALT: 29 U/L (ref 0–35)
AST: 18 U/L (ref 0–37)
Albumin: 4.5 g/dL (ref 3.5–5.2)
Alkaline Phosphatase: 97 U/L (ref 39–117)
BUN: 10 mg/dL (ref 6–23)
CO2: 27 mEq/L (ref 19–32)
Calcium: 10.1 mg/dL (ref 8.4–10.5)
Chloride: 96 mEq/L (ref 96–112)
Creatinine, Ser: 0.38 mg/dL — ABNORMAL LOW (ref 0.50–1.10)
GFR calc Af Amer: >90 mL/min (ref >90)
GFR calc non Af Amer: >90 mL/min (ref >90)
Glucose, Bld: 293 mg/dL — ABNORMAL HIGH (ref 70–99)
Potassium: 3.7 mEq/L (ref 3.7–5.3)
Sodium: 139 mEq/L (ref 137–147)
Total Bilirubin: 0.3 mg/dL (ref 0.3–1.2)
Total Protein: 8.1 g/dL (ref 6.0–8.3)

## 2013-04-22 LAB — URINALYSIS, ROUTINE W REFLEX MICROSCOPIC
Bilirubin Urine: NEGATIVE
Glucose, UA: >1000 mg/dL — AB
Hgb urine dipstick: NEGATIVE
Ketones, ur: >80 mg/dL — AB
Leukocytes, UA: NEGATIVE
Nitrite: NEGATIVE
Protein, ur: NEGATIVE mg/dL
Specific Gravity, Urine: 1.045 — ABNORMAL HIGH (ref 1.005–1.030)
Urobilinogen, UA: 0.2 mg/dL (ref 0.0–1.0)
pH: 6.5 (ref 5.0–8.0)

## 2013-04-22 LAB — URINE MICROSCOPIC-ADD ON

## 2013-04-22 LAB — D-DIMER, QUANTITATIVE (NOT AT ARMC): D-Dimer, Quant: <0.27 ug/mL-FEU (ref 0.00–0.48)

## 2013-04-22 LAB — PREGNANCY, URINE: Preg Test, Ur: NEGATIVE

## 2013-04-22 LAB — GLUCOSE, CAPILLARY: Glucose-Capillary: 328 mg/dL — ABNORMAL HIGH (ref 70–99)

## 2013-04-22 LAB — TROPONIN I: Troponin I: <0.3 ng/mL (ref <0.30)

## 2013-04-22 IMAGING — CR DG CHEST 1V PORT
1 series · 1 of 1 positions shown · non-contrast
Comparison: [DATE]

CLINICAL DATA: Chest discomfort. Body aches. Diabetes and
hypertension.

EXAM:
PORTABLE CHEST - 1 VIEW

[AP]
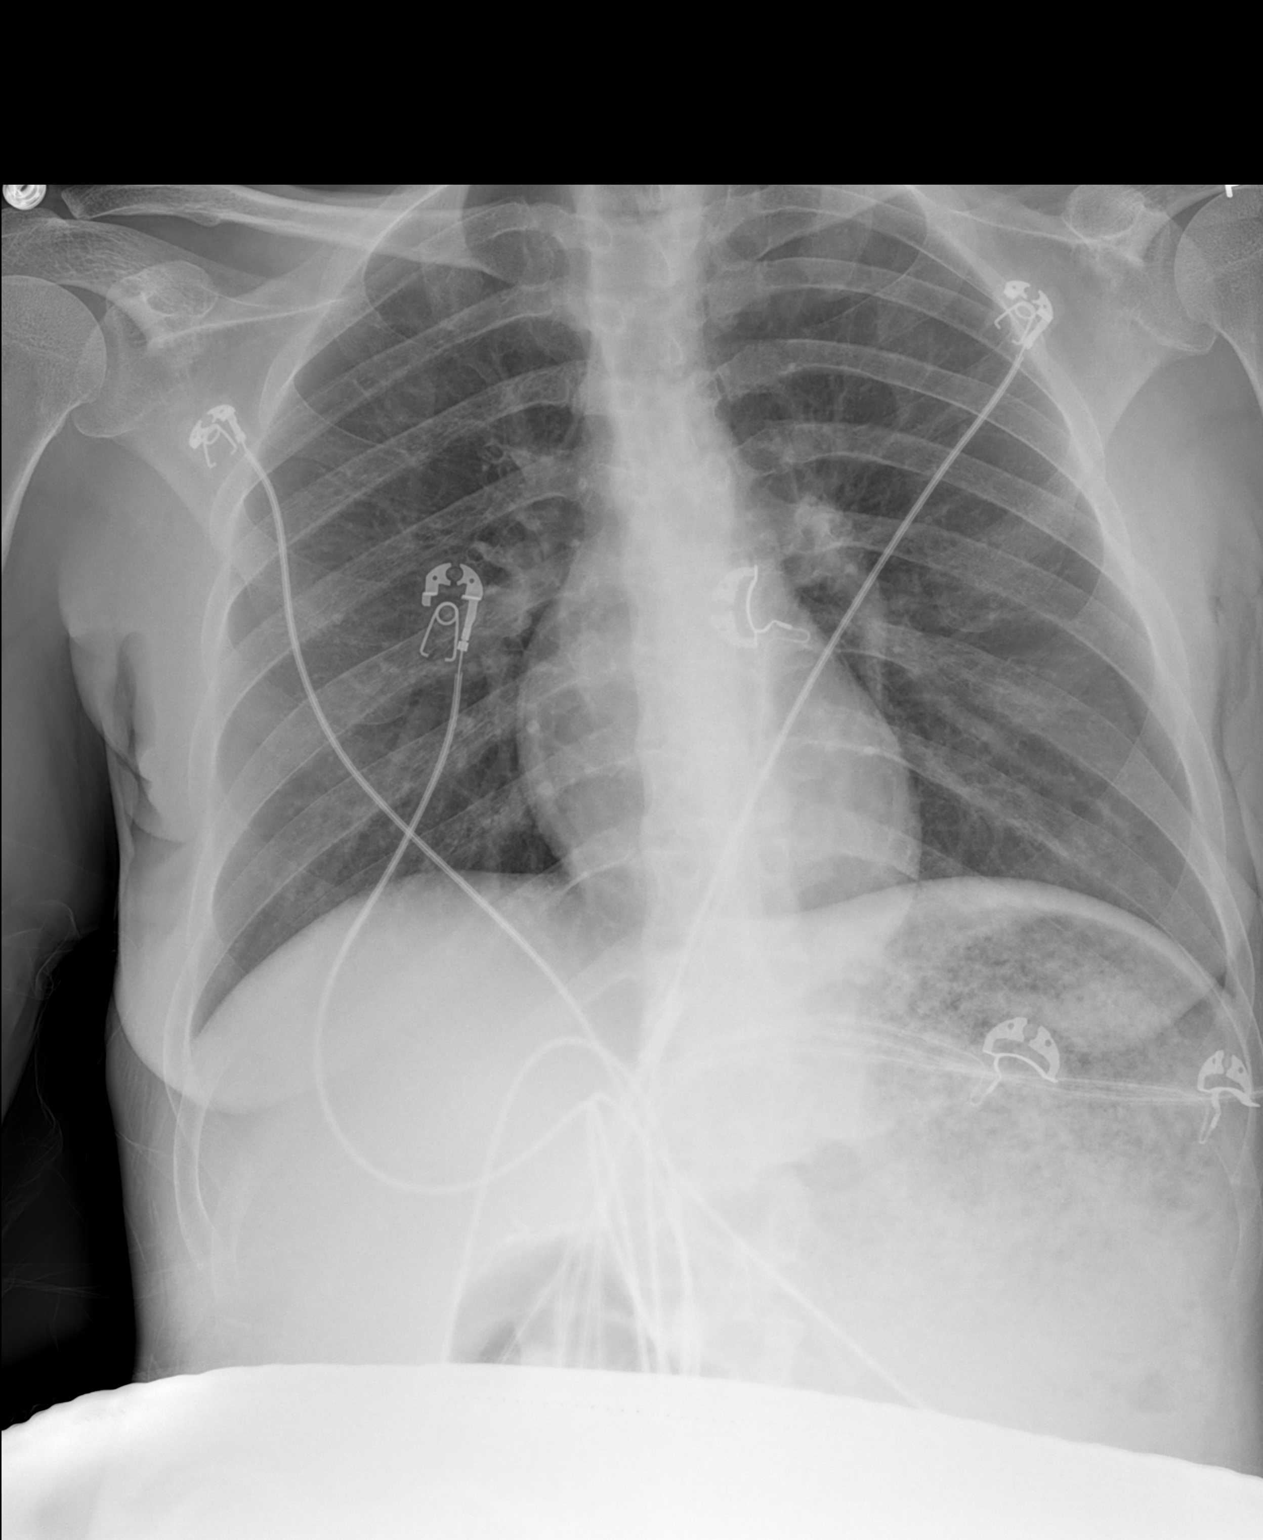

[1 of 1 positions shown; findings below may reference images not displayed]

FINDINGS: The heart size and mediastinal contours are within normal limits.
Both lungs are clear. The visualized skeletal structures are
unremarkable.
IMPRESSION: No active disease.

## 2013-04-22 MED ORDER — SODIUM CHLORIDE 0.9 % IV BOLUS (SEPSIS)
1000.0000 mL | Freq: Once | INTRAVENOUS | Status: AC
  Administered 2013-04-22: 1000 mL via INTRAVENOUS

## 2013-04-22 MED ORDER — MORPHINE SULFATE 4 MG/ML IJ SOLN
4.0000 mg | Freq: Once | INTRAMUSCULAR | Status: AC
  Administered 2013-04-22: 4 mg via INTRAVENOUS
  Filled 2013-04-22: qty 1

## 2013-04-22 NOTE — ED Notes (Signed)
Pt c/o intermittent generalized body aches x 2 months and chest tightness starting this afternoon.  Pain score 10/10.  Hx of DM.

## 2013-04-22 NOTE — ED Provider Notes (Signed)
CSN: [id number]     Arrival date & time 04/22/13  1628 History   First MD Initiated Contact with Patient 04/22/13 1723     Chief Complaint  Patient presents with  . Generalized bodyaches   . Chest Pain  . Hyperglycemia   (Consider location/radiation/quality/duration/timing/severity/associated sxs/prior Treatment) HPI Comments: Patient is a 23 year old female past medical history significant for DM, HTN, gastroparesis presenting to the emergency department for 2 months of worsening generalized neuropathy pain. Patient describes her pain as sharp and stabbing with no alleviating factors. She rates her pain 10 out of 10. Patient states he developed some diffuse chest tightness this afternoon that feels similar to previous episodes of chest tightness. She denies any aggravating factors. She denies any shortness of breath, nausea, vomiting, diarrhea, recent travel, unilateral leg swelling, exogenous estrogen use, recent surgeries or immobilizations, history of DVT or PE. Patient just had her Neurontin dose increased one and a half weeks ago by her neurologist. She has been discussing her neuropathy pain with her neurologist.   Patient is a 23 y.o. female presenting with chest pain and hyperglycemia.  Chest Pain Associated symptoms: palpitations   Associated symptoms: no abdominal pain, no fever, no nausea, no shortness of breath and not vomiting   Hyperglycemia Associated symptoms: chest pain   Associated symptoms: no abdominal pain, no fever, no nausea, no shortness of breath and no vomiting     Past Medical History  Diagnosis Date  . Diabetes mellitus   . Hypertension   . Diabetes type 1, uncontrolled 11/14/2011    Since age 25  . Gastroparesis    Past Surgical History  Procedure Laterality Date  . Ankle surgery    . Cholecystectomy  11/15/2011    Procedure: LAPAROSCOPIC CHOLECYSTECTOMY WITH INTRAOPERATIVE CHOLANGIOGRAM;  Surgeon: [name], MD;  Location: [facility];  Service: General;   Laterality: N/A;  . Laparoscopy  11/23/2011    Procedure: LAPAROSCOPY DIAGNOSTIC;  Surgeon: Mariella Saa, MD;  Location: [facility];  Service: General;  Laterality: N/A;  . Esophagogastroduodenoscopy  12/03/2011    Procedure: ESOPHAGOGASTRODUODENOSCOPY (EGD);  Surgeon: Theda Belfast, MD;  Location: [facility];  Service: Endoscopy;  Laterality: N/A;   Family History  Problem Relation Age of Onset  . Diabetes Mother   . Hypertension Father    History  Substance Use Topics  . Smoking status: Never Smoker   . Smokeless tobacco: Never Used  . Alcohol Use: Yes     Comment: Occasional   OB History   Grav Para Term Preterm Abortions TAB SAB Ect Mult Living                 Review of Systems  Constitutional: Negative for fever and chills.  Respiratory: Positive for chest tightness. Negative for shortness of breath.   Cardiovascular: Positive for chest pain and palpitations. Negative for leg swelling.  Gastrointestinal: Negative for nausea, vomiting and abdominal pain.  Musculoskeletal: Positive for myalgias.  All other systems reviewed and are negative.    Allergies  Peanut-containing drug products  Home Medications   Current Outpatient Rx  Name  Route  Sig  Dispense  Refill  . citalopram (CELEXA) 20 MG tablet   Oral   Take 20 mg by mouth at bedtime.         . cyclobenzaprine (FLEXERIL) 10 MG tablet   Oral   Take 1 tablet (10 mg total) by mouth 3 (three) times daily as needed for muscle spasms (muscle pain).   30  tablet   0   . dicyclomine (BENTYL) 10 MG capsule   Oral   Take 10 mg by mouth 4 (four) times daily -  before meals and at bedtime.         . enalapril (VASOTEC) 10 MG tablet   Oral   Take 10 mg by mouth every evening.          . gabapentin (NEURONTIN) 400 MG capsule   Oral   Take 400 mg by mouth 3 (three) times daily. 400 mg in the morning 400 mg at noon and 800 mg at bedtime         . insulin glargine (LANTUS) 100 UNIT/ML injection    Subcutaneous   Inject 40 Units into the skin at bedtime.         . insulin lispro (HUMALOG) 100 UNIT/ML injection   Subcutaneous   Inject 5-20 Units into the skin 4 (four) times daily -  before meals and at bedtime. Inject 5 units (base) and additional units according to sliding scale- sliding scale: CBG 70 - 120: 0 uunits: CBG 121 - 150: 2 units; CBG 151 - 200: 3 units; CBG 201 - 250: 5 uunits; CBG 251 - 300: 8 units;CBG 301 - 350: 11 units; CBG 351 - 400: 15 units; CBG > 400 Call MD         . metoCLOPramide (REGLAN) 10 MG tablet   Oral   Take 1 tablet (10 mg total) by mouth 4 (four) times daily.   120 tablet   0   . naproxen (NAPROSYN) 250 MG tablet   Oral   Take 1 tablet (250 mg total) by mouth 2 (two) times daily.   30 tablet   0   . oxyCODONE-acetaminophen (PERCOCET/ROXICET) 5-325 MG per tablet   Oral   Take 2 tablets by mouth every 4 (four) hours as needed for moderate pain or severe pain.         . promethazine (PHENERGAN) 25 MG tablet   Oral   Take 25 mg by mouth every 6 (six) hours as needed for nausea or vomiting. For nausea         . predniSONE (DELTASONE) 20 MG tablet      2 tabs po daily x 4 days   10 tablet   0    BP 110/65  Pulse 109  Temp(Src) 98.7 F (37.1 C) (Oral)  Resp 15  SpO2 99%  LMP 02/14/2013 Physical Exam  Constitutional: She is oriented to person, place, and time. She appears well-developed and well-nourished. No distress.  HENT:  Head: Normocephalic and atraumatic.  Right Ear: External ear normal.  Left Ear: External ear normal.  Nose: Nose normal.  Mouth/Throat: Oropharynx is clear and moist. No oropharyngeal exudate.  Eyes: Conjunctivae and EOM are normal. Pupils are equal, round, and reactive to light.  Neck: Normal range of motion. Neck supple.  Cardiovascular: Regular rhythm, normal heart sounds and intact distal pulses.  Tachycardia present.   No murmur heard. Pulmonary/Chest: Effort normal and breath sounds normal. No  respiratory distress. She has no wheezes. She has no rales. She exhibits no tenderness.  Abdominal: Soft. Bowel sounds are normal. There is no tenderness.  Musculoskeletal: Normal range of motion. She exhibits no edema and no tenderness.  Lymphadenopathy:    She has no cervical adenopathy.  Neurological: She is alert and oriented to person, place, and time. She has normal strength. No cranial nerve deficit or sensory deficit. GCS eye subscore is 4.  GCS verbal subscore is 5. GCS motor subscore is 6.  Skin: Skin is warm and dry. She is not diaphoretic.    ED Course  Procedures (including critical care time) Medications  sodium chloride 0.9 % bolus 1,000 mL (0 mLs Intravenous Stopped 04/22/13 1845)  morphine 4 MG/ML injection 4 mg (4 mg Intravenous Given 04/22/13 1745)  sodium chloride 0.9 % bolus 1,000 mL (0 mLs Intravenous Stopped 04/22/13 1945)  morphine 4 MG/ML injection 4 mg (4 mg Intravenous Given 04/22/13 1945)  sodium chloride 0.9 % bolus 1,000 mL (1,000 mLs Intravenous New Bag/Given 04/22/13 1945)    Labs Review Labs Reviewed  COMPREHENSIVE METABOLIC PANEL - Abnormal; Notable for the following:    Glucose, Bld 293 (*)    Creatinine, Ser 0.38 (*)    All other components within normal limits  URINALYSIS, ROUTINE W REFLEX MICROSCOPIC - Abnormal; Notable for the following:    Specific Gravity, Urine 1.045 (*)    Glucose, UA >1000 (*)    Ketones, ur >80 (*)    All other components within normal limits  GLUCOSE, CAPILLARY - Abnormal; Notable for the following:    Glucose-Capillary 328 (*)    All other components within normal limits  URINE MICROSCOPIC-ADD ON - Abnormal; Notable for the following:    Casts HYALINE CASTS (*)    All other components within normal limits  CBC WITH DIFFERENTIAL  PREGNANCY, URINE  TROPONIN I  D-DIMER, QUANTITATIVE   Imaging Review Dg Chest Portable 1 View  04/22/2013   CLINICAL DATA:  Chest discomfort. Body aches. Diabetes and hypertension.  EXAM:  PORTABLE CHEST - 1 VIEW  COMPARISON:  02/10/2012  FINDINGS: The heart size and mediastinal contours are within normal limits. Both lungs are clear. The visualized skeletal structures are unremarkable.  IMPRESSION: No active disease.   Electronically Signed   By: Myles RosenthalJohn  Stahl M.D.   On: 04/22/2013 18:08    EKG Interpretation   None       MDM   1. Palpitations   2. Neuropathy     Filed Vitals:   04/22/13 1930  BP: 110/65  Pulse: 109  Temp:   Resp: 15    Anion Gap 16   Afebrile, NAD, non-toxic appearing, AAOx4.   1) Palpitations: Patient presenting with sinus tachycardia and palpitations. Regular rhythm appreciated. P waves noted on EKG. Intact distal pulses. No respiratory distress. Patient is to be discharged with recommendation to follow up with PCP and cardiologist in regards to today's hospital visit. No tracheal deviation, no JVD or new murmur, RRR, breath sounds equal bilaterally, EKG without acute abnormalities, negative troponin, negative d-dimer and negative CXR. Heart rate improved after IVF and pain medication. Chest tightness resolved with IVF and pain medications.  2) Neuropathy: No neurofocal deficits. Patient with chronic neuropathy with gradual increase in pain, just had Neurotin dose adjusted one and a half weeks ago. No acute changes. Advised Neurology follow to continue discussion for chronic worsening neuropathy pain.   Patient is comfortable with plan for d/c with outpatient follow ups. Return precautions discussed. Patient is agreeable to plan. Patient is stable at time of discharge   Case has been discussed with and seen by Dr. Romeo AppleHarrison who agrees with the above plan to discharge.   Jeannetta EllisJennifer L Guerry Covington, PA-C 04/22/13 2317

## 2013-04-22 NOTE — ED Provider Notes (Signed)
Medical screening examination/treatment/procedure(s) were conducted as a shared visit with non-physician practitioner(s) and myself.  I personally evaluated the patient during the encounter.  Date: 04/22/2013  Rate: 153  Rhythm: sinus tachycardia  QRS Axis: normal  Intervals: QT prolonged  ST/T Wave abnormalities: normal  Conduction Disutrbances:none  Narrative Interpretation: No ST or T wave changes consistent with ischemia.  Old EKG Reviewed: unchanged  I interviewed and examined the patient. Lungs are CTAB. Cardiac exam wnl, sinus tachycardia. Abdomen soft. Pt denies sob, has had some chest tightness. Low risk per wells, will get d-dimer.   D-dimer neg, labs non-contrib. I suspect her tachycardia is related to dehydration as her HR is significantly improving w/ IVF hydration. Pt feeling better. Will d/c and provide return precautions.    Junius Argyle, MD 04/22/13 [id number]

## 2013-04-22 NOTE — Discharge Instructions (Signed)
Please follow up with your primary care physician in 1-2 days. If you do not have one please call the [facility] number listed above. Please follow up with your Neurologist to discuss your neuropathy pain management. Please follow up with the Cardiologist to schedule a follow up appointment. Please read all discharge instructions and return precautions.   Palpitations  A palpitation is the feeling that your heartbeat is irregular or is faster than normal. It may feel like your heart is fluttering or skipping a beat. Palpitations are usually not a serious problem. However, in some cases, you may need further medical evaluation. CAUSES  Palpitations can be caused by:  Smoking.  Caffeine or other stimulants, such as diet pills or energy drinks.  Alcohol.  Stress and anxiety.  Strenuous physical activity.  Fatigue.  Certain medicines.  Heart disease, especially if you have a history of arrhythmias. This includes atrial fibrillation, atrial flutter, or supraventricular tachycardia.  An improperly working pacemaker or defibrillator. DIAGNOSIS  To find the cause of your palpitations, your caregiver will take your history and perform a physical exam. Tests may also be done, including:  Electrocardiography (ECG). This test records the heart's electrical activity.  Cardiac monitoring. This allows your caregiver to monitor your heart rate and rhythm in real time.  Holter monitor. This is a portable device that records your heartbeat and can help diagnose heart arrhythmias. It allows your caregiver to track your heart activity for several days, if needed.  Stress tests by exercise or by giving medicine that makes the heart beat faster. TREATMENT  Treatment of palpitations depends on the cause of your symptoms and can vary greatly. Most cases of palpitations do not require any treatment other than time, relaxation, and monitoring your symptoms. Other causes, such as atrial  fibrillation, atrial flutter, or supraventricular tachycardia, usually require further treatment. HOME CARE INSTRUCTIONS   Avoid:  Caffeinated coffee, tea, soft drinks, diet pills, and energy drinks.  Chocolate.  Alcohol.  Stop smoking if you smoke.  Reduce your stress and anxiety. Things that can help you relax include:  A method that measures bodily functions so you can learn to control them (biofeedback).  Yoga.  Meditation.  Physical activity such as swimming, jogging, or walking.  Get plenty of rest and sleep. SEEK MEDICAL CARE IF:   You continue to have a fast or irregular heartbeat beyond 24 hours.  Your palpitations occur more often. SEEK IMMEDIATE MEDICAL CARE IF:  You develop chest pain or shortness of breath.  You have a severe headache.  You feel dizzy, or you faint. MAKE SURE YOU:  Understand these instructions.  Will watch your condition.  Will get help right away if you are not doing well or get worse. Document Released: 03/14/2000 Document Revised: 07/12/2012 Document Reviewed: 05/16/2011 [facility] Patient Information 2014 [employer] [place].  Neuropathic Pain We often think that pain has a physical cause. If we get rid of the cause, the pain should go away. Nerves themselves can also cause pain. It is called neuropathic pain, which means nerve abnormality. It may be difficult for the patients who have it and for the treating caregivers. Pain is usually described as acute (short-lived) or chronic (long-lasting). Acute pain is related to the physical sensations caused by an injury. It can last from a few seconds to many weeks, but it usually goes away when normal healing occurs. Chronic pain lasts beyond the typical healing time. With neuropathic pain, the nerve fibers themselves may be  damaged or injured. They then send incorrect signals to other pain centers. The pain you feel is real, but the cause is not easy to find.  CAUSES  Chronic pain can result  from diseases, such as diabetes and shingles (an infection related to chickenpox), or from trauma, surgery, or amputation. It can also happen without any known injury or disease. The nerves are sending pain messages, even though there is no identifiable cause for such messages.   Other common causes of neuropathy include diabetes, phantom limb pain, or Regional Pain Syndrome (RPS).  As with all forms of chronic back pain, if neuropathy is not correctly treated, there can be a number of associated problems that lead to a downward cycle for the patient. These include depression, sleeplessness, feelings of fear and anxiety, limited social interaction and inability to do normal daily activities or work.  The most dramatic and mysterious example of neuropathic pain is called "phantom limb syndrome." This occurs when an arm or a leg has been removed because of illness or injury. The brain still gets pain messages from the nerves that originally carried impulses from the missing limb. These nerves now seem to misfire and cause troubling pain.  Neuropathic pain often seems to have no cause. It responds poorly to standard pain treatment. Neuropathic pain can occur after:  Shingles (herpes zoster virus infection).  A lasting burning sensation of the skin, caused usually by injury to a peripheral nerve.  Peripheral neuropathy which is widespread nerve damage, often caused by diabetes or alcoholism.  Phantom limb pain following an amputation.  Facial nerve problems (trigeminal neuralgia).  Multiple sclerosis.  Reflex sympathetic dystrophy.  Pain which comes with cancer and cancer chemotherapy.  Entrapment neuropathy such as when pressure is put on a nerve such as in carpal tunnel syndrome.  Back, leg, and hip problems (sciatica).  Spine or back surgery.  HIV Infection or AIDS where nerves are infected by viruses. Your caregiver can explain items in the above list which may apply to  you. SYMPTOMS  Characteristics of neuropathic pain are:  Severe, sharp, electric shock-like, shooting, lightening-like, knife-like.  Pins and needles sensation.  Deep burning, deep cold, or deep ache.  Persistent numbness, tingling, or weakness.  Pain resulting from light touch or other stimulus that would not usually cause pain.  Increased sensitivity to something that would normally cause pain, such as a pinprick. Pain may persist for months or years following the healing of damaged tissues. When this happens, pain signals no longer sound an alarm about current injuries or injuries about to happen. Instead, the alarm system itself is not working correctly.  Neuropathic pain may get worse instead of better over time. For some people, it can lead to serious disability. It is important to be aware that severe injury in a limb can occur without a proper, protective pain response.Burns, cuts, and other injuries may go unnoticed. Without proper treatment, these injuries can become infected or lead to further disability. Take any injury seriously, and consult your caregiver for treatment. DIAGNOSIS  When you have a pain with no known cause, your caregiver will probably ask some specific questions:   Do you have any other conditions, such as diabetes, shingles, multiple sclerosis, or HIV infection?  How would you describe your pain? (Neuropathic pain is often described as shooting, stabbing, burning, or searing.)  Is your pain worse at any time of the day? (Neuropathic pain is usually worse at night.)  Does the pain seem to follow  a certain physical pathway?  Does the pain come from an area that has missing or injured nerves? (An example would be phantom limb pain.)  Is the pain triggered by minor things such as rubbing against the sheets at night? These questions often help define the type of pain involved. Once your caregiver knows what is happening, treatment can begin. Anticonvulsant,  antidepressant drugs, and various pain relievers seem to work in some cases. If another condition, such as diabetes is involved, better management of that disorder may relieve the neuropathic pain.  TREATMENT  Neuropathic pain is frequently long-lasting and tends not to respond to treatment with narcotic type pain medication. It may respond well to other drugs such as antiseizure and antidepressant medications. Usually, neuropathic problems do not completely go away, but partial improvement is often possible with proper treatment. Your caregivers have large numbers of medications available to treat you. Do not be discouraged if you do not get immediate relief. Sometimes different medications or a combination of medications will be tried before you receive the results you are hoping for. See your caregiver if you have pain that seems to be coming from nowhere and does not go away. Help is available.  SEEK IMMEDIATE MEDICAL CARE IF:   There is a sudden change in the quality of your pain, especially if the change is on only one side of the body.  You notice changes of the skin, such as redness, black or purple discoloration, swelling, or an ulcer.  You cannot move the affected limbs. Document Released: 12/13/2003 Document Revised: 06/09/2011 Document Reviewed: 12/13/2003 [facility] Patient Information 2014 [place], [place].

## 2013-04-24 ENCOUNTER — Encounter (HOSPITAL_COMMUNITY): Payer: Self-pay | Admitting: Emergency Medicine

## 2013-04-24 ENCOUNTER — Emergency Department (HOSPITAL_COMMUNITY)
Admission: EM | Admit: 2013-04-24 | Discharge: 2013-04-24 | Disposition: A | Discharge status: Home / Self Care | Payer: 59 | Attending: Emergency Medicine | Admitting: Emergency Medicine

## 2013-04-24 DIAGNOSIS — E1065 Type 1 diabetes mellitus with hyperglycemia: Secondary | ICD-10-CM | POA: Insufficient documentation

## 2013-04-24 DIAGNOSIS — Z791 Long term (current) use of non-steroidal anti-inflammatories (NSAID): Secondary | ICD-10-CM | POA: Insufficient documentation

## 2013-04-24 DIAGNOSIS — Z794 Long term (current) use of insulin: Secondary | ICD-10-CM | POA: Insufficient documentation

## 2013-04-24 DIAGNOSIS — Z79899 Other long term (current) drug therapy: Secondary | ICD-10-CM | POA: Insufficient documentation

## 2013-04-24 DIAGNOSIS — I10 Essential (primary) hypertension: Secondary | ICD-10-CM | POA: Insufficient documentation

## 2013-04-24 DIAGNOSIS — E1140 Type 2 diabetes mellitus with diabetic neuropathy, unspecified: Secondary | ICD-10-CM

## 2013-04-24 DIAGNOSIS — R079 Chest pain, unspecified: Secondary | ICD-10-CM | POA: Insufficient documentation

## 2013-04-24 DIAGNOSIS — R000 Tachycardia, unspecified: Secondary | ICD-10-CM | POA: Insufficient documentation

## 2013-04-24 DIAGNOSIS — E1142 Type 2 diabetes mellitus with diabetic polyneuropathy: Secondary | ICD-10-CM | POA: Insufficient documentation

## 2013-04-24 DIAGNOSIS — E1049 Type 1 diabetes mellitus with other diabetic neurological complication: Secondary | ICD-10-CM | POA: Insufficient documentation

## 2013-04-24 DIAGNOSIS — Z8719 Personal history of other diseases of the digestive system: Secondary | ICD-10-CM | POA: Insufficient documentation

## 2013-04-24 DIAGNOSIS — M797 Fibromyalgia: Secondary | ICD-10-CM

## 2013-04-24 DIAGNOSIS — G8929 Other chronic pain: Secondary | ICD-10-CM | POA: Insufficient documentation

## 2013-04-24 DIAGNOSIS — IMO0001 Reserved for inherently not codable concepts without codable children: Secondary | ICD-10-CM | POA: Insufficient documentation

## 2013-04-24 LAB — GLUCOSE, CAPILLARY: Glucose-Capillary: 140 mg/dL — ABNORMAL HIGH (ref 70–99)

## 2013-04-24 MED ORDER — OXYCODONE-ACETAMINOPHEN 5-325 MG PO TABS: 1.0000 | ORAL_TABLET | Freq: Four times a day (QID) | ORAL | Status: DC | PRN

## 2013-04-24 MED ORDER — MORPHINE SULFATE 4 MG/ML IJ SOLN
4.0000 mg | Freq: Once | INTRAMUSCULAR | Status: AC
  Administered 2013-04-24: 4 mg via INTRAMUSCULAR
  Filled 2013-04-24: qty 1

## 2013-04-24 MED ORDER — OXYCODONE-ACETAMINOPHEN 5-325 MG PO TABS
2.0000 | ORAL_TABLET | Freq: Once | ORAL | Status: AC
  Administered 2013-04-24: 2 via ORAL
  Filled 2013-04-24: qty 2

## 2013-04-24 NOTE — ED Provider Notes (Signed)
CSN: [id number]     Arrival date & time 04/24/13  0457 History   First MD Initiated Contact with Patient 04/24/13 [id number]     Chief Complaint  Patient presents with  . Muscle Pain    chronic pain   (Consider location/radiation/quality/duration/timing/severity/associated sxs/prior Treatment) HPI  Patient with a PMH of chronic pain due to diabetic neuropathy presents to the ED for pain. She has had many visits here in the past month for the same. She reports that she has seen the neurologist who sent her to a Rheumatologist. She tested negative for Lupus and RA. They believe she has fibromyalgia. She is waiting for her referral to go through with pain management. Her next appointment with Neurology is on Feb 4. She says she has been unable to get the appointment date moved up and is severely uncomfortable. She describes having pain all over, this is like her usual pain exacerbation. No new or different symptoms or location. She is tachycardic and restless in the stretcher. She reports being out the Percocet. She has been taking her Celexa, Flexeril, Neurontin and ibuprofen but says she could not handle the pain at home. Denies SOB, headache, confusion, fevers, joint swellings.  Past Medical History  Diagnosis Date  . Diabetes mellitus   . Hypertension   . Diabetes type 1, uncontrolled 11/14/2011    Since age 23  . Gastroparesis    Past Surgical History  Procedure Laterality Date  . Ankle surgery    . Cholecystectomy  11/15/2011    Procedure: LAPAROSCOPIC CHOLECYSTECTOMY WITH INTRAOPERATIVE CHOLANGIOGRAM;  Surgeon: Ardeth SportsmanSteven C. Gross, MD;  Location: [facility];  Service: General;  Laterality: N/A;  . Laparoscopy  11/23/2011    Procedure: LAPAROSCOPY DIAGNOSTIC;  Surgeon: Mariella SaaBenjamin T Hoxworth, MD;  Location: [facility];  Service: General;  Laterality: N/A;  . Esophagogastroduodenoscopy  12/03/2011    Procedure: ESOPHAGOGASTRODUODENOSCOPY (EGD);  Surgeon: Theda BelfastPatrick D Hung, MD;  [facility];  Service:  Endoscopy;  Laterality: N/A;   Family History  Problem Relation Age of Onset  . Diabetes Mother   . Hypertension Father    History  Substance Use Topics  . Smoking status: Never Smoker   . Smokeless tobacco: Never Used  . Alcohol Use: Yes     Comment: Occasional   OB History   Grav Para Term Preterm Abortions TAB SAB Ect Mult Living                 Review of Systems Review of Systems  Constitutional: Negative for fever and chills.  Respiratory: Positive for chest tightness. Negative for shortness of breath.  Cardiovascular:. Negative for leg swelling.  Gastrointestinal: Negative for nausea, vomiting and abdominal pain.  Musculoskeletal: Positive for myalgias.  All other systems reviewed and are negative.  Allergies  Peanut-containing drug products  Home Medications   Current Outpatient Rx  Name  Route  Sig  Dispense  Refill  . citalopram (CELEXA) 20 MG tablet   Oral   Take 20 mg by mouth at bedtime.         . cyclobenzaprine (FLEXERIL) 10 MG tablet   Oral   Take 1 tablet (10 mg total) by mouth 3 (three) times daily as needed for muscle spasms (muscle pain).   30 tablet   0   . dicyclomine (BENTYL) 10 MG capsule   Oral   Take 10 mg by mouth 4 (four) times daily -  before meals and at bedtime.         .Marland Kitchen  enalapril (VASOTEC) 10 MG tablet   Oral   Take 10 mg by mouth every evening.          . gabapentin (NEURONTIN) 400 MG capsule   Oral   Take 400 mg by mouth 3 (three) times daily. 400 mg in the morning 400 mg at noon and 800 mg at bedtime         . ibuprofen (ADVIL,MOTRIN) 600 MG tablet   Oral   Take 600 mg by mouth every 6 (six) hours as needed.         . insulin glargine (LANTUS) 100 UNIT/ML injection   Subcutaneous   Inject 40 Units into the skin at bedtime.         . insulin lispro (HUMALOG) 100 UNIT/ML injection   Subcutaneous   Inject 5-20 Units into the skin 4 (four) times daily -  before meals and at bedtime. Inject 5 units (base) and  additional units according to sliding scale- sliding scale: CBG 70 - 120: 0 uunits: CBG 121 - 150: 2 units; CBG 151 - 200: 3 units; CBG 201 - 250: 5 uunits; CBG 251 - 300: 8 units;CBG 301 - 350: 11 units; CBG 351 - 400: 15 units; CBG > 400 Call MD         . metoCLOPramide (REGLAN) 10 MG tablet   Oral   Take 1 tablet (10 mg total) by mouth 4 (four) times daily.   120 tablet   0   . naproxen (NAPROSYN) 250 MG tablet   Oral   Take 1 tablet (250 mg total) by mouth 2 (two) times daily.   30 tablet   0   . oxyCODONE-acetaminophen (PERCOCET/ROXICET) 5-325 MG per tablet   Oral   Take 2 tablets by mouth every 4 (four) hours as needed for moderate pain or severe pain.         . promethazine (PHENERGAN) 25 MG tablet   Oral   Take 25 mg by mouth every 6 (six) hours as needed for nausea or vomiting. For nausea         . predniSONE (DELTASONE) 20 MG tablet      2 tabs po daily x 4 days   10 tablet   0    BP 111/71  Pulse 118  Temp(Src) 98 F (36.7 C) (Oral)  Resp 16  Ht 5\' 7"  (1.702 m)  Wt 120 lb (54.432 kg)  BMI 18.79 kg/m2  SpO2 98%  LMP 02/14/2013 Physical Exam  Nursing note and vitals reviewed. Constitutional: She appears well-developed and well-nourished. No distress.  HENT:  Head: Normocephalic and atraumatic.  Eyes: Pupils are equal, round, and reactive to light.  Neck: Normal range of motion. Neck supple.  Cardiovascular: Regular rhythm.  Tachycardia present.   Pulmonary/Chest: Effort normal.  Abdominal: Soft.  Neurological: She is alert.  Skin: Skin is warm and dry.    ED Course  Procedures (including critical care time) Labs Review Labs Reviewed  GLUCOSE, CAPILLARY - Abnormal; Notable for the following:    Glucose-Capillary 140 (*)    All other components within normal limits   Imaging Review Dg Chest Portable 1 View  04/22/2013   CLINICAL DATA:  Chest discomfort. Body aches. Diabetes and hypertension.  EXAM: PORTABLE CHEST - 1 VIEW  COMPARISON:   02/10/2012  FINDINGS: The heart size and mediastinal contours are within normal limits. Both lungs are clear. The visualized skeletal structures are unremarkable.  IMPRESSION: No active disease.   Electronically Signed  By: Myles Rosenthal M.D.   On: 04/22/2013 18:08    EKG Interpretation   None       MDM   1. Diabetic neuropathy   2. Fibromyalgia    Patients pain treated in the ED. She is planning to follow-up with cardiology regarding her chronic tachycardia (per patient and in chart review) She is feeling much better now but is still having pain.  Will give short course of pain medication and she is encouraged to make it last until her Feb 4 appointment.  23 y.o.Fleur Uren Grabski's evaluation in the Emergency Department is complete. It has been determined that no acute conditions requiring further emergency intervention are present at this time. The patient/guardian have been advised of the diagnosis and plan. We have discussed signs and symptoms that warrant return to the ED, such as changes or worsening in symptoms.  Vital signs are stable at discharge. Filed Vitals:   04/24/13 0801  BP: 111/71  Pulse: 118  Temp: 98 F (36.7 C)  Resp: 16    Patient/guardian has voiced understanding and agreed to follow-up with the PCP or specialist.     Dorthula Matas, PA-C 04/24/13 [phone]

## 2013-04-24 NOTE — ED Notes (Signed)
Pt has chronic pain all over body,  Was seen here on 23rd for same  Says she can't get a soon enough appointment time so she came back here because of pain

## 2013-04-24 NOTE — ED Provider Notes (Signed)
Medical screening examination/treatment/procedure(s) were performed by non-physician practitioner and as supervising physician I was immediately available for consultation/collaboration.    Sunnie Nielsen, MD 04/24/13 2259

## 2013-04-24 NOTE — Discharge Instructions (Signed)

## 2013-04-27 ENCOUNTER — Encounter (HOSPITAL_COMMUNITY): Payer: Self-pay | Admitting: Emergency Medicine

## 2013-04-27 ENCOUNTER — Observation Stay (HOSPITAL_COMMUNITY)
Admission: EM | Admit: 2013-04-27 | Discharge: 2013-04-29 | Disposition: A | Discharge status: Home / Self Care | Payer: 59 | Attending: Internal Medicine | Admitting: Internal Medicine

## 2013-04-27 DIAGNOSIS — R0989 Other specified symptoms and signs involving the circulatory and respiratory systems: Secondary | ICD-10-CM | POA: Insufficient documentation

## 2013-04-27 DIAGNOSIS — M792 Neuralgia and neuritis, unspecified: Secondary | ICD-10-CM

## 2013-04-27 DIAGNOSIS — R52 Pain, unspecified: Secondary | ICD-10-CM

## 2013-04-27 DIAGNOSIS — R5381 Other malaise: Secondary | ICD-10-CM | POA: Insufficient documentation

## 2013-04-27 DIAGNOSIS — Z79899 Other long term (current) drug therapy: Secondary | ICD-10-CM | POA: Insufficient documentation

## 2013-04-27 DIAGNOSIS — I10 Essential (primary) hypertension: Secondary | ICD-10-CM | POA: Insufficient documentation

## 2013-04-27 DIAGNOSIS — R0789 Other chest pain: Secondary | ICD-10-CM | POA: Insufficient documentation

## 2013-04-27 DIAGNOSIS — E43 Unspecified severe protein-calorie malnutrition: Secondary | ICD-10-CM | POA: Insufficient documentation

## 2013-04-27 DIAGNOSIS — E1159 Type 2 diabetes mellitus with other circulatory complications: Secondary | ICD-10-CM | POA: Diagnosis present

## 2013-04-27 DIAGNOSIS — Z9089 Acquired absence of other organs: Secondary | ICD-10-CM | POA: Insufficient documentation

## 2013-04-27 DIAGNOSIS — R000 Tachycardia, unspecified: Secondary | ICD-10-CM | POA: Diagnosis present

## 2013-04-27 DIAGNOSIS — E109 Type 1 diabetes mellitus without complications: Secondary | ICD-10-CM | POA: Insufficient documentation

## 2013-04-27 DIAGNOSIS — I152 Hypertension secondary to endocrine disorders: Secondary | ICD-10-CM | POA: Diagnosis present

## 2013-04-27 DIAGNOSIS — R5383 Other fatigue: Secondary | ICD-10-CM | POA: Insufficient documentation

## 2013-04-27 DIAGNOSIS — I498 Other specified cardiac arrhythmias: Secondary | ICD-10-CM | POA: Insufficient documentation

## 2013-04-27 DIAGNOSIS — G903 Multi-system degeneration of the autonomic nervous system: Secondary | ICD-10-CM

## 2013-04-27 DIAGNOSIS — I951 Orthostatic hypotension: Principal | ICD-10-CM

## 2013-04-27 DIAGNOSIS — G8929 Other chronic pain: Secondary | ICD-10-CM | POA: Insufficient documentation

## 2013-04-27 DIAGNOSIS — R0609 Other forms of dyspnea: Secondary | ICD-10-CM | POA: Insufficient documentation

## 2013-04-27 DIAGNOSIS — Z794 Long term (current) use of insulin: Secondary | ICD-10-CM | POA: Insufficient documentation

## 2013-04-27 LAB — COMPREHENSIVE METABOLIC PANEL
ALT: 21 U/L (ref 0–35)
AST: 18 U/L (ref 0–37)
Albumin: 4.1 g/dL (ref 3.5–5.2)
Alkaline Phosphatase: 88 U/L (ref 39–117)
BUN: 12 mg/dL (ref 6–23)
CO2: 30 mEq/L (ref 19–32)
Calcium: 10.4 mg/dL (ref 8.4–10.5)
Chloride: 96 mEq/L (ref 96–112)
Creatinine, Ser: 0.41 mg/dL — ABNORMAL LOW (ref 0.50–1.10)
GFR calc Af Amer: >90 mL/min (ref >90)
GFR calc non Af Amer: >90 mL/min (ref >90)
Glucose, Bld: 156 mg/dL — ABNORMAL HIGH (ref 70–99)
Potassium: 3.8 mEq/L (ref 3.7–5.3)
Sodium: 138 mEq/L (ref 137–147)
Total Bilirubin: 0.3 mg/dL (ref 0.3–1.2)
Total Protein: 8.1 g/dL (ref 6.0–8.3)

## 2013-04-27 LAB — GLUCOSE, CAPILLARY: Glucose-Capillary: 181 mg/dL — ABNORMAL HIGH (ref 70–99)

## 2013-04-27 LAB — CBC WITH DIFFERENTIAL/PLATELET
Basophils Absolute: 0 10*3/uL (ref 0.0–0.1)
Basophils Relative: 1 % (ref 0–1)
Eosinophils Absolute: 0.1 10*3/uL (ref 0.0–0.7)
Eosinophils Relative: 2 % (ref 0–5)
HCT: 40.3 % (ref 36.0–46.0)
Hemoglobin: 14.5 g/dL (ref 12.0–15.0)
Lymphocytes Relative: 50 % — ABNORMAL HIGH (ref 12–46)
Lymphs Abs: 2.6 10*3/uL (ref 0.7–4.0)
MCH: 30.5 pg (ref 26.0–34.0)
MCHC: 36 g/dL (ref 30.0–36.0)
MCV: 84.7 fL (ref 78.0–100.0)
Monocytes Absolute: 0.5 10*3/uL (ref 0.1–1.0)
Monocytes Relative: 9 % (ref 3–12)
Neutro Abs: 2 10*3/uL (ref 1.7–7.7)
Neutrophils Relative %: 38 % — ABNORMAL LOW (ref 43–77)
Platelets: 233 10*3/uL (ref 150–400)
RBC: 4.76 MIL/uL (ref 3.87–5.11)
RDW: 11.9 % (ref 11.5–15.5)
WBC: 5.2 10*3/uL (ref 4.0–10.5)

## 2013-04-27 MED ORDER — ONDANSETRON HCL 4 MG/2ML IJ SOLN
4.0000 mg | Freq: Once | INTRAMUSCULAR | Status: AC
  Administered 2013-04-27: 4 mg via INTRAVENOUS
  Filled 2013-04-27: qty 2

## 2013-04-27 MED ORDER — SODIUM CHLORIDE 0.9 % IV BOLUS (SEPSIS)
1000.0000 mL | Freq: Once | INTRAVENOUS | Status: AC
  Administered 2013-04-27: 1000 mL via INTRAVENOUS

## 2013-04-27 MED ORDER — MORPHINE SULFATE 4 MG/ML IJ SOLN
4.0000 mg | Freq: Once | INTRAMUSCULAR | Status: AC
  Administered 2013-04-27: 4 mg via INTRAVENOUS
  Filled 2013-04-27: qty 1

## 2013-04-27 MED ORDER — SODIUM CHLORIDE 0.9 % IV SOLN
INTRAVENOUS | Status: DC
  Administered 2013-04-27: 22:00:00 via INTRAVENOUS

## 2013-04-27 NOTE — ED Notes (Signed)
Pt taken sprite per order and encouraged to drink.

## 2013-04-27 NOTE — ED Provider Notes (Signed)
CSN: [id number]     Arrival date & time 04/27/13  1840 History   First MD Initiated Contact with Patient 04/27/13 2010     Chief Complaint  Patient presents with  . Generalized Body Aches   (Consider location/radiation/quality/duration/timing/severity/associated sxs/prior Treatment) HPI  23 year old female with history of type 1 diabetes, hypertension, chronic tachycardia and recently a diagnosis of fibromyalgia presents complaining of generalized body pain and palpitations. This is her third visit within the week. Patient states for the past 2 months she has been experiencing generalized body aches which she felt to be related to her neuropathy. The symptom is getting progressively worse. She has been managed by her neurologist, Dr. Oswaldo ConroyPenamali with Neurontin and Flexeril. Medication hasn't helped. She has also been seen and evaluated by a rheumatologist 2 weeks ago and was ruled out for rheumatoid arthritis and lupus. She continues to have worsening symptoms. States her heart is racing more than usual. She feels dehydrated as well. She denies fever, chills, nausea vomiting diarrhea, abdominal pain, back pain, dysuria, hematuria or melena. Her primary reason to come in today these because she felt lightheadedness and dizziness along with worsening pain. No complaints of dyspnea on exertion. No prior history of PE and no risk factors for PE.    Past Medical History  Diagnosis Date  . Diabetes mellitus   . Hypertension   . Diabetes type 1, uncontrolled 11/14/2011    Since age 23  . Gastroparesis    Past Surgical History  Procedure Laterality Date  . Ankle surgery    . Cholecystectomy  11/15/2011    Procedure: LAPAROSCOPIC CHOLECYSTECTOMY WITH INTRAOPERATIVE CHOLANGIOGRAM;  Surgeon: Ardeth SportsmanSteven C. Gross, MD;  Location: [facility];  Service: General;  Laterality: N/A;  . Laparoscopy  11/23/2011    Procedure: LAPAROSCOPY DIAGNOSTIC;  Surgeon: Mariella SaaBenjamin T Hoxworth, MD;  Location: [facility];  Service: General;   Laterality: N/A;  . Esophagogastroduodenoscopy  12/03/2011    Procedure: ESOPHAGOGASTRODUODENOSCOPY (EGD);  Surgeon: Theda BelfastPatrick D Hung, MD;  [facility];  Service: Endoscopy;  Laterality: N/A;   Family History  Problem Relation Age of Onset  . Diabetes Mother   . Hypertension Father    History  Substance Use Topics  . Smoking status: Never Smoker   . Smokeless tobacco: Never Used  . Alcohol Use: Yes     Comment: Occasional   OB History   Grav Para Term Preterm Abortions TAB SAB Ect Mult Living                 Review of Systems  All other systems reviewed and are negative.    Allergies  Peanut-containing drug products  Home Medications   Current Outpatient Rx  Name  Route  Sig  Dispense  Refill  . citalopram (CELEXA) 20 MG tablet   Oral   Take 20 mg by mouth at bedtime.         . cyclobenzaprine (FLEXERIL) 10 MG tablet   Oral   Take 1 tablet (10 mg total) by mouth 3 (three) times daily as needed for muscle spasms (muscle pain).   30 tablet   0   . dicyclomine (BENTYL) 10 MG capsule   Oral   Take 10 mg by mouth 4 (four) times daily -  before meals and at bedtime.         . enalapril (VASOTEC) 10 MG tablet   Oral   Take 10 mg by mouth every evening.          .Marland Kitchen  gabapentin (NEURONTIN) 400 MG capsule   Oral   Take 400 mg by mouth 3 (three) times daily. 400 mg in the morning 400 mg at noon and 800 mg at bedtime         . ibuprofen (ADVIL,MOTRIN) 600 MG tablet   Oral   Take 600 mg by mouth every 6 (six) hours as needed.         . insulin glargine (LANTUS) 100 UNIT/ML injection   Subcutaneous   Inject 40 Units into the skin at bedtime.         . insulin lispro (HUMALOG) 100 UNIT/ML injection   Subcutaneous   Inject 5-20 Units into the skin 4 (four) times daily -  before meals and at bedtime. Inject 5 units (base) and additional units according to sliding scale- sliding scale: CBG 70 - 120: 0 uunits: CBG 121 - 150: 2 units; CBG 151 - 200: 3  units; CBG 201 - 250: 5 uunits; CBG 251 - 300: 8 units;CBG 301 - 350: 11 units; CBG 351 - 400: 15 units; CBG > 400 Call MD         . metoCLOPramide (REGLAN) 10 MG tablet   Oral   Take 1 tablet (10 mg total) by mouth 4 (four) times daily.   120 tablet   0   . oxyCODONE-acetaminophen (PERCOCET/ROXICET) 5-325 MG per tablet   Oral   Take 1-2 tablets by mouth every 6 (six) hours as needed for severe pain.   15 tablet   0   . promethazine (PHENERGAN) 25 MG tablet   Oral   Take 25 mg by mouth every 6 (six) hours as needed for nausea or vomiting. For nausea          BP 118/70  Pulse 146  Temp(Src) 98.7 F (37.1 C) (Oral)  Resp 15  Ht 5\' 7"  (1.702 m)  Wt 121 lb (54.885 kg)  BMI 18.95 kg/m2  SpO2 98%  LMP 02/14/2013 Physical Exam  Nursing note and vitals reviewed. Constitutional: She appears well-developed and well-nourished. No distress.  HENT:  Head: Atraumatic.  Eyes: Conjunctivae are normal.  Neck: Neck supple.  Cardiovascular: Intact distal pulses.   Tachycardia without murmurs, rubs, or gallops noted  Pulmonary/Chest: Effort normal and breath sounds normal. No respiratory distress. She has no wheezes.  Abdominal: Soft. There is no tenderness.  Musculoskeletal: She exhibits tenderness (Generalized tenderness throughout all 4 extremities without focal point tenderness. No overlying skin changes.).  Neurological: She is alert. She displays normal reflexes.  Skin: No rash noted.  Psychiatric: She has a normal mood and affect.    ED Course  Procedures (including critical care time)   Date: 04/27/2013  Rate: 149  Rhythm: sinus tachycardia  QRS Axis: normal  Intervals: normal  ST/T Wave abnormalities: nonspecific T wave changes  Conduction Disutrbances:none  Narrative Interpretation:   Old EKG Reviewed: unchanged    8:57 PM Patient with history of chronic tachycardia as documented from previous notes. EKG with p-wave and no evidence of SVT.  She is here with  generalized body aches which has been seen for the same complaint several times this week. She does not appear toxic as she has no significant risk factors for PE.  She has been worked up for PE from her recent ER visits.  She has negative D-dimer nearly a week ago when she presents with similar sxs.  CXR on 04/22/13 was negative as well.    9:57 PM Pt has positive orthostasis with BP  drops down to 70s systolic upon standing.  Will continue with IVF. I suspect pt may have undiagnosed POTTS.  Care discussed with Dr. Juleen China.    11:50 PM Patient states she felt better after receiving IV fluid and pain medication. States her pain has not fully resolved but has improved. No active chest pain. She is able to tolerates by mouth.  12:37 AM Pt may have autonomic dysfunction maybe related to her diabetes.  Since she was tachy in the 140s and continue to have positive orthostasis after receiving 2L of IVF, will consult for admission for further care.   1:01 AM I have consulted with [facility], Dr. Toniann Fail who agrees to admit pt to tele, team 8 under his care.  Pt is aware of plan.  Will recheck UA, pregnancy test.  Pt will likely need blood cultures, lactic acid, TSH, Free T3/T4, trop, CK.    Labs Review Labs Reviewed  GLUCOSE, CAPILLARY - Abnormal; Notable for the following:    Glucose-Capillary 181 (*)    All other components within normal limits  CBC WITH DIFFERENTIAL - Abnormal; Notable for the following:    Neutrophils Relative % 38 (*)    Lymphocytes Relative 50 (*)    All other components within normal limits  COMPREHENSIVE METABOLIC PANEL - Abnormal; Notable for the following:    Glucose, Bld 156 (*)    Creatinine, Ser 0.41 (*)    All other components within normal limits  URINALYSIS, ROUTINE W REFLEX MICROSCOPIC   Imaging Review No results found.  EKG Interpretation    Date/Time:  Wednesday April 27 2013 19:45:31 EST Ventricular Rate:  149 PR Interval:  120 QRS  Duration: 71 QT Interval:  292 QTC Calculation: 460 R Axis:   80 Text Interpretation:  Sinus tachycardia right atrial enlargement Non-specific ST-t changes Confirmed by KOHUT  MD, STEPHEN ([id number]) on 04/28/2013 12:33:31 AM            MDM   1. Neuropathic pain   2. Orthostatic hypotension dysautonomic syndrome    BP 115/72  Pulse 112  Temp(Src) 98.7 F (37.1 C) (Oral)  Resp 15  Ht 5\' 7"  (1.702 m)  Wt 121 lb (54.885 kg)  BMI 18.95 kg/m2  SpO2 97%  LMP 02/14/2013  I have reviewed nursing notes and vital signs. I personally reviewed the imaging tests through PACS system  I reviewed available ER/hospitalization records thought the EMR     Fayrene Helper, [place] 04/28/13 0103

## 2013-04-27 NOTE — ED Notes (Signed)
Pt c/o generalized body pain with palpitation onset this afternoon @ 1600 with chest discomfort.

## 2013-04-27 NOTE — ED Notes (Signed)
Pt is aware of the need for urine. 

## 2013-04-28 ENCOUNTER — Encounter (HOSPITAL_COMMUNITY): Payer: Self-pay | Admitting: *Deleted

## 2013-04-28 DIAGNOSIS — E43 Unspecified severe protein-calorie malnutrition: Secondary | ICD-10-CM | POA: Insufficient documentation

## 2013-04-28 DIAGNOSIS — I951 Orthostatic hypotension: Principal | ICD-10-CM | POA: Diagnosis present

## 2013-04-28 DIAGNOSIS — IMO0002 Reserved for concepts with insufficient information to code with codable children: Secondary | ICD-10-CM

## 2013-04-28 DIAGNOSIS — E109 Type 1 diabetes mellitus without complications: Secondary | ICD-10-CM | POA: Diagnosis present

## 2013-04-28 DIAGNOSIS — R52 Pain, unspecified: Secondary | ICD-10-CM

## 2013-04-28 DIAGNOSIS — R072 Precordial pain: Secondary | ICD-10-CM

## 2013-04-28 DIAGNOSIS — G903 Multi-system degeneration of the autonomic nervous system: Secondary | ICD-10-CM | POA: Insufficient documentation

## 2013-04-28 DIAGNOSIS — I498 Other specified cardiac arrhythmias: Secondary | ICD-10-CM

## 2013-04-28 LAB — COMPREHENSIVE METABOLIC PANEL
ALT: 16 U/L (ref 0–35)
AST: 16 U/L (ref 0–37)
Albumin: 3 g/dL — ABNORMAL LOW (ref 3.5–5.2)
Alkaline Phosphatase: 65 U/L (ref 39–117)
BUN: 10 mg/dL (ref 6–23)
CO2: 26 mEq/L (ref 19–32)
Calcium: 8.4 mg/dL (ref 8.4–10.5)
Chloride: 97 mEq/L (ref 96–112)
Creatinine, Ser: 0.36 mg/dL — ABNORMAL LOW (ref 0.50–1.10)
GFR calc Af Amer: >90 mL/min (ref >90)
GFR calc non Af Amer: >90 mL/min (ref >90)
Glucose, Bld: 291 mg/dL — ABNORMAL HIGH (ref 70–99)
Potassium: 4.4 mEq/L (ref 3.7–5.3)
Sodium: 133 mEq/L — ABNORMAL LOW (ref 137–147)
Total Bilirubin: 0.3 mg/dL (ref 0.3–1.2)
Total Protein: 5.9 g/dL — ABNORMAL LOW (ref 6.0–8.3)

## 2013-04-28 LAB — URINE MICROSCOPIC-ADD ON

## 2013-04-28 LAB — TROPONIN I
Troponin I: <0.3 ng/mL (ref <0.30)
Troponin I: <0.3 ng/mL (ref <0.30)

## 2013-04-28 LAB — RAPID URINE DRUG SCREEN, HOSP PERFORMED
Amphetamines: NOT DETECTED
Barbiturates: NOT DETECTED
Benzodiazepines: NOT DETECTED
Cocaine: NOT DETECTED
Opiates: POSITIVE — AB
Tetrahydrocannabinol: NOT DETECTED

## 2013-04-28 LAB — CBC WITH DIFFERENTIAL/PLATELET
Basophils Absolute: 0 10*3/uL (ref 0.0–0.1)
Basophils Relative: 0 % (ref 0–1)
Eosinophils Absolute: 0.1 10*3/uL (ref 0.0–0.7)
Eosinophils Relative: 3 % (ref 0–5)
HCT: 32.6 % — ABNORMAL LOW (ref 36.0–46.0)
Hemoglobin: 11.5 g/dL — ABNORMAL LOW (ref 12.0–15.0)
Lymphocytes Relative: 56 % — ABNORMAL HIGH (ref 12–46)
Lymphs Abs: 2.6 10*3/uL (ref 0.7–4.0)
MCH: 30.6 pg (ref 26.0–34.0)
MCHC: 35.3 g/dL (ref 30.0–36.0)
MCV: 86.7 fL (ref 78.0–100.0)
Monocytes Absolute: 0.3 10*3/uL (ref 0.1–1.0)
Monocytes Relative: 7 % (ref 3–12)
Neutro Abs: 1.5 10*3/uL — ABNORMAL LOW (ref 1.7–7.7)
Neutrophils Relative %: 33 % — ABNORMAL LOW (ref 43–77)
Platelets: 176 10*3/uL (ref 150–400)
RBC: 3.76 MIL/uL — ABNORMAL LOW (ref 3.87–5.11)
RDW: 12.2 % (ref 11.5–15.5)
WBC: 4.6 10*3/uL (ref 4.0–10.5)

## 2013-04-28 LAB — URINALYSIS, ROUTINE W REFLEX MICROSCOPIC
Bilirubin Urine: NEGATIVE
Glucose, UA: >1000 mg/dL — AB
Hgb urine dipstick: NEGATIVE
Ketones, ur: 15 mg/dL — AB
Leukocytes, UA: NEGATIVE
Nitrite: NEGATIVE
Protein, ur: NEGATIVE mg/dL
Specific Gravity, Urine: 1.043 — ABNORMAL HIGH (ref 1.005–1.030)
Urobilinogen, UA: 0.2 mg/dL (ref 0.0–1.0)
pH: 7 (ref 5.0–8.0)

## 2013-04-28 LAB — GLUCOSE, CAPILLARY
Glucose-Capillary: 215 mg/dL — ABNORMAL HIGH (ref 70–99)
Glucose-Capillary: 241 mg/dL — ABNORMAL HIGH (ref 70–99)
Glucose-Capillary: 158 mg/dL — ABNORMAL HIGH (ref 70–99)
Glucose-Capillary: 129 mg/dL — ABNORMAL HIGH (ref 70–99)
Glucose-Capillary: 228 mg/dL — ABNORMAL HIGH (ref 70–99)

## 2013-04-28 LAB — T4, FREE: Free T4: 1.35 ng/dL (ref 0.80–1.80)

## 2013-04-28 LAB — TSH: TSH: 1.385 u[IU]/mL (ref 0.350–4.500)

## 2013-04-28 LAB — CORTISOL: Cortisol, Plasma: 7.7 ug/dL

## 2013-04-28 LAB — CK: Total CK: 36 U/L (ref 7–177)

## 2013-04-28 LAB — T3, FREE: T3, Free: 3.5 pg/mL (ref 2.3–4.2)

## 2013-04-28 LAB — D-DIMER, QUANTITATIVE: D-Dimer, Quant: <0.27 ug/mL-FEU (ref 0.00–0.48)

## 2013-04-28 MED ORDER — SODIUM CHLORIDE 0.9 % IV SOLN
INTRAVENOUS | Status: AC
  Administered 2013-04-28: 02:00:00 via INTRAVENOUS

## 2013-04-28 MED ORDER — GLUCERNA SHAKE PO LIQD
237.0000 mL | ORAL | Status: DC
  Filled 2013-04-28 (×2): qty 237

## 2013-04-28 MED ORDER — GABAPENTIN 400 MG PO CAPS
800.0000 mg | ORAL_CAPSULE | Freq: Every day | ORAL | Status: DC
  Administered 2013-04-28: 800 mg via ORAL
  Filled 2013-04-28 (×2): qty 2

## 2013-04-28 MED ORDER — METOCLOPRAMIDE HCL 10 MG PO TABS
10.0000 mg | ORAL_TABLET | Freq: Four times a day (QID) | ORAL | Status: DC
  Administered 2013-04-28 – 2013-04-29 (×4): 10 mg via ORAL
  Filled 2013-04-28 (×7): qty 1

## 2013-04-28 MED ORDER — PROMETHAZINE HCL 25 MG PO TABS: 25.0000 mg | ORAL_TABLET | Freq: Four times a day (QID) | ORAL | Status: DC | PRN

## 2013-04-28 MED ORDER — ACETAMINOPHEN 650 MG RE SUPP: 650.0000 mg | Freq: Four times a day (QID) | RECTAL | Status: DC | PRN

## 2013-04-28 MED ORDER — ENOXAPARIN SODIUM 40 MG/0.4ML ~~LOC~~ SOLN
40.0000 mg | SUBCUTANEOUS | Status: DC
  Administered 2013-04-28 – 2013-04-29 (×2): 40 mg via SUBCUTANEOUS
  Filled 2013-04-28 (×2): qty 0.4

## 2013-04-28 MED ORDER — HYDROMORPHONE HCL PF 1 MG/ML IJ SOLN: 1.0000 mg | INTRAMUSCULAR | Status: DC | PRN

## 2013-04-28 MED ORDER — DICYCLOMINE HCL 10 MG PO CAPS
10.0000 mg | ORAL_CAPSULE | Freq: Three times a day (TID) | ORAL | Status: DC
  Administered 2013-04-28 – 2013-04-29 (×6): 10 mg via ORAL
  Filled 2013-04-28 (×9): qty 1

## 2013-04-28 MED ORDER — CITALOPRAM HYDROBROMIDE 20 MG PO TABS
20.0000 mg | ORAL_TABLET | Freq: Every day | ORAL | Status: DC
  Administered 2013-04-28: 20 mg via ORAL
  Filled 2013-04-28 (×2): qty 1

## 2013-04-28 MED ORDER — CYCLOBENZAPRINE HCL 10 MG PO TABS
10.0000 mg | ORAL_TABLET | Freq: Three times a day (TID) | ORAL | Status: DC | PRN
  Administered 2013-04-28: 10 mg via ORAL
  Filled 2013-04-28 (×2): qty 1

## 2013-04-28 MED ORDER — ONDANSETRON HCL 4 MG PO TABS: 4.0000 mg | ORAL_TABLET | Freq: Four times a day (QID) | ORAL | Status: DC | PRN

## 2013-04-28 MED ORDER — ONDANSETRON HCL 4 MG/2ML IJ SOLN: 4.0000 mg | Freq: Four times a day (QID) | INTRAMUSCULAR | Status: DC | PRN

## 2013-04-28 MED ORDER — INFLUENZA VAC SPLIT QUAD 0.5 ML IM SUSP
0.5000 mL | INTRAMUSCULAR | Status: AC
  Administered 2013-04-29: 0.5 mL via INTRAMUSCULAR
  Filled 2013-04-28 (×2): qty 0.5

## 2013-04-28 MED ORDER — GABAPENTIN 400 MG PO CAPS
400.0000 mg | ORAL_CAPSULE | ORAL | Status: DC
  Administered 2013-04-28 – 2013-04-29 (×4): 400 mg via ORAL
  Filled 2013-04-28 (×5): qty 1

## 2013-04-28 MED ORDER — OXYCODONE-ACETAMINOPHEN 5-325 MG PO TABS
1.0000 | ORAL_TABLET | Freq: Four times a day (QID) | ORAL | Status: DC | PRN
Start: 2013-04-28 — End: 2013-04-29
  Administered 2013-04-29: 2 via ORAL
  Filled 2013-04-28: qty 2

## 2013-04-28 MED ORDER — HYDROMORPHONE HCL PF 1 MG/ML IJ SOLN
0.5000 mg | INTRAMUSCULAR | Status: DC | PRN
  Administered 2013-04-28 (×2): 0.5 mg via INTRAVENOUS
  Filled 2013-04-28 (×2): qty 1

## 2013-04-28 MED ORDER — MORPHINE SULFATE 4 MG/ML IJ SOLN
4.0000 mg | Freq: Once | INTRAMUSCULAR | Status: AC
  Administered 2013-04-28: 4 mg via INTRAVENOUS
  Filled 2013-04-28: qty 1

## 2013-04-28 MED ORDER — ACETAMINOPHEN 325 MG PO TABS: 650.0000 mg | ORAL_TABLET | Freq: Four times a day (QID) | ORAL | Status: DC | PRN

## 2013-04-28 MED ORDER — HYDROMORPHONE HCL PF 1 MG/ML IJ SOLN
0.5000 mg | Freq: Four times a day (QID) | INTRAMUSCULAR | Status: DC | PRN
  Administered 2013-04-28 – 2013-04-29 (×4): 0.5 mg via INTRAVENOUS
  Filled 2013-04-28 (×4): qty 1

## 2013-04-28 MED ORDER — SODIUM CHLORIDE 0.9 % IJ SOLN
3.0000 mL | Freq: Two times a day (BID) | INTRAMUSCULAR | Status: DC
  Administered 2013-04-28 (×2): 3 mL via INTRAVENOUS

## 2013-04-28 MED ORDER — INSULIN GLARGINE 100 UNIT/ML ~~LOC~~ SOLN
40.0000 [IU] | Freq: Every day | SUBCUTANEOUS | Status: DC
  Administered 2013-04-28 (×2): 40 [IU] via SUBCUTANEOUS
  Filled 2013-04-28 (×4): qty 0.4

## 2013-04-28 MED ORDER — SODIUM CHLORIDE 0.9 % IV SOLN
INTRAVENOUS | Status: AC
  Administered 2013-04-28 – 2013-04-29 (×2): via INTRAVENOUS

## 2013-04-28 MED ORDER — INSULIN ASPART 100 UNIT/ML ~~LOC~~ SOLN
0.0000 [IU] | Freq: Three times a day (TID) | SUBCUTANEOUS | Status: DC
  Administered 2013-04-28: 3 [IU] via SUBCUTANEOUS
  Administered 2013-04-28: 2 [IU] via SUBCUTANEOUS
  Administered 2013-04-28: 1 [IU] via SUBCUTANEOUS
  Administered 2013-04-29: 2 [IU] via SUBCUTANEOUS

## 2013-04-28 NOTE — Progress Notes (Signed)
Inpatient Diabetes Program Recommendations  AACE/ADA: New Consensus Statement on Inpatient Glycemic Control (2013)  Target Ranges:  Prepandial:   less than 140 mg/dL      Peak postprandial:   less than 180 mg/dL (1-2 hours)      Critically ill patients:  140 - 180 mg/dL   Reason for Visit: Type 1 DM  Diabetes history: Type 1 since age 23 Outpatient Diabetes medications: Lantus 40 QHS and Novolog 5 units tidwc plus S/S Current orders for Inpatient glycemic control: Lantus 40 QHS and Novolog sensitive tidwc  Inpatient Diabetes Program Recommendations Correction (SSI): Add HS correction Insulin - Meal Coverage: Add Novolog 4 units tidwc if pt eats >50% meal HgbA1C: Need updated HgbA1C to assess glycemic control at home, Last one 11.9% on 09/01/2012.  Note: Will continue to follow. Thank you. Ailene Ards, RD, LDN, CDE Inpatient Diabetes Coordinator [phone]

## 2013-04-28 NOTE — Progress Notes (Signed)
INITIAL NUTRITION ASSESSMENT  Pt meets criteria for SEVERE MALNUTRITION in the context of chronic illness as evidenced by a 12% weight loss in one month and energy intake </= 75% for >/= one month.  DOCUMENTATION CODES Per approved criteria  -Severe malnutrition in the context of chronic illness   INTERVENTION: 1. Glucerna PO daily 2. Recommend changing diet to Carb modified diet 3. Will monitor DM diet education needs 4. Continue to monitor pt  NUTRITION DIAGNOSIS: Unintentional weight loss related to decreased appetite from body aches as evidenced by diet recall of <75% energy intake.    Goal: Pt to meet >/=90% of their estimated nutrition needs.  Monitor:  PO intake, weight trends, labs, oral supplement acceptance  Reason for Assessment: Pt was identified as nutrition risk by the malnutrition screening tool.  23 y.o. female  Admitting Dx: Orthostatic hypotension  ASSESSMENT: [name] is a 23 y.o. female history of diabetes mellitus type 1 and hypertension, chronic tachycardia, and recently a diagnosis of fibromyalgia presents complaining of generalized body pain over the last 2 months.  Pt reports having a decreased appetite over the last 2 months due to body aches from fibromyalgia. She reports not eating much with a usual diet recall of only eating one meal a day in the evening (chicken and vegetables) and sometimes snacks in the morning and during the day (applesauce or yogurt). Pt denies any stomach pains or nausea. Pt reports a 20 lb weight loss over the past 2 months. Pt has tried nutritional shakes in the past but reports not liking it. Pt was encouraged to try to consume adequate nutrition even if appetite is down through either eating or drinking nutritional shakes. Pt is willing to try oral supplements. Pt is currently on a cardiac diet and reports eating 100% of her breakfast. RD recommends changing diet to carb modified as pt is eating 100% of meal. RD notes pt  has allergies to peanut-containing drug products, but notices an open container of peanut butter on pt's table. Pt reported good blood sugar control as home, no questions related to diabetic diet. Will continue to monitor education needs.   Height: Ht Readings from Last 1 Encounters:  04/28/13 5\' 7"  (1.702 m)    Weight: Wt Readings from Last 1 Encounters:  04/28/13 123 lb 10.9 oz (56.1 kg)    Ideal Body Weight: 135 lbs  % Ideal Body Weight: 91%  Wt Readings from Last 10 Encounters:  04/28/13 123 lb 10.9 oz (56.1 kg)  04/24/13 120 lb (54.432 kg)  03/26/13 140 lb (63.504 kg)  03/18/13 142 lb (64.411 kg)  01/12/12 107 lb (48.535 kg)  03/16/13 140 lb (63.504 kg)  09/01/12 115 lb 15.4 oz (52.6 kg)  04/15/12 106 lb (48.081 kg)  03/15/12 106 lb 0.7 oz (48.1 kg)  02/29/12 111 lb 8.8 oz (50.6 kg)    Usual Body Weight: unable to determine  % Usual Body Weight: unable to determine  BMI:  Body mass index is 19.37 kg/(m^2).  Estimated Nutritional Needs: Kcal: 1500-1700 kcals Protein: 50-60 grams Fluid: >1.5L/day  Skin: No issues noted  Diet Order: Cardiac  EDUCATION NEEDS: -Education needs addressed- will continue to monitor   Intake/Output Summary (Last 24 hours) at 04/28/13 1106 Last data filed at 04/28/13 0600  Gross per 24 hour  Intake 349.17 ml  Output    225 ml  Net 124.17 ml    Last BM: 1/28   Labs:   Recent Labs Lab 04/22/13 1735 04/27/13 2105  04/28/13 0732  NA 139 138 133*  K 3.7 3.8 4.4  CL 96 96 97  CO2 27 30 26   BUN 10 12 10   CREATININE 0.38* 0.41* 0.36*  CALCIUM 10.1 10.4 8.4  GLUCOSE 293* 156* 291*    CBG (last 3)   Recent Labs  04/27/13 2036 04/28/13 0334 04/28/13 0740  GLUCAP 181* 215* 241*    Scheduled Meds: . sodium chloride   Intravenous STAT  . citalopram  20 mg Oral QHS  . dicyclomine  10 mg Oral TID AC & HS  . enoxaparin (LOVENOX) injection  40 mg Subcutaneous Q24H  . gabapentin  400 mg Oral Custom  . gabapentin   800 mg Oral QHS  . [START ON 04/29/2013] influenza vac split quadrivalent PF  0.5 mL Intramuscular Tomorrow-1000  . insulin aspart  0-9 Units Subcutaneous TID WC  . insulin glargine  40 Units Subcutaneous QHS  . metoCLOPramide  10 mg Oral QID  . sodium chloride  3 mL Intravenous Q12H    Continuous Infusions: . sodium chloride 100 mL/hr at 04/28/13 1884    Past Medical History  Diagnosis Date  . Diabetes mellitus   . Hypertension   . Diabetes type 1, uncontrolled 11/14/2011    Since age 29  . Gastroparesis     Past Surgical History  Procedure Laterality Date  . Ankle surgery    . Cholecystectomy  11/15/2011    Procedure: LAPAROSCOPIC CHOLECYSTECTOMY WITH INTRAOPERATIVE CHOLANGIOGRAM;  Surgeon: [name], MD;  Location: [facility];  Service: General;  Laterality: N/A;  . Laparoscopy  11/23/2011    Procedure: LAPAROSCOPY DIAGNOSTIC;  Surgeon: Mariella Saa, MD;  Location: [facility];  Service: General;  Laterality: N/A;  . Esophagogastroduodenoscopy  12/03/2011    Procedure: ESOPHAGOGASTRODUODENOSCOPY (EGD);  Surgeon: Theda Belfast, MD;  [facility];  Service: Endoscopy;  Laterality: N/A;    Marijean Niemann Dietetic Intern Pager: [phone]  Lloyd Huger MS RD LDN Clinical Dietitian [phone]

## 2013-04-28 NOTE — Progress Notes (Signed)
Patient seen and examined. Diffuse chronic pain, somewhat chronic, with prior extensive workup. She is seeing Neurology, Rheumatology, and is trying to get to see chronic pain. Has been in our ED 1/23, 1/25 and 1/28 with "nerve type generalized pain". She has been to [facility] ED 1/18, 1/21, 1/24 and there are concerns that she had 7 narcotic prescriptions filled this month alone per EDP at [facility]. Hydrate and repeat orthostatic vital signs. Cortisol 7.7, and this is relatively normal also given that opioids will suppress cortisol. If clinical suspicion for adrenal insufficiency arises in the future she could have a simulation test but needs to be at least 24 h without any opioid medications.   Syncere Eble M. Cruzita Lederer, MD [facility] [phone]

## 2013-04-28 NOTE — Progress Notes (Signed)
UR completed 

## 2013-04-28 NOTE — Progress Notes (Signed)
[facility]  2D Echocardiogram completed.  Zeek Rostron L Erminie Foulks, RDCS 04/28/2013 12:50 PM

## 2013-04-28 NOTE — H&P (Signed)
[facility] History and Physical  KANITRA PURIFOY ONG:[id number] DOB: 12-05-1990 DOA: 04/27/2013  Referring physician: ER physician. PCP: Salena Saner., MD  Specialists: Dr. Corwin Levins. Neurologist.  Chief Complaint: Generalized bodyaches and weakness.  HPI: [name] is a 23 y.o. female history of diabetes mellitus type 1 and hypertension has been experiencing generalized body aches over the last 2 months. Patient has been to patient neurologist and was referred to rheumatologist. As per the patient patient rheumatologist has had done ANA and rheumatoid factors which has come negative last week. Patient has been coming to the ER last 3 days and was discharged home with pain relief medications. Last night patient was in addition to weakness was found to be hypotensive and tachycardic with heart rate around 140 per minute. Patient was given 2 L normal saline bolus after which pressure and heart rate improved. Patient has been afebrile. D-dimer done 2 days ago was normal. Patient has been admitted for further observation hydration. Patient at times has chest tightness but present chest pain-free. Denies any nausea vomiting abdominal pain or diarrhea fever chills headache.   Review of Systems: As presented in the history of presenting illness, rest negative.  Past Medical History  Diagnosis Date  . Diabetes mellitus   . Hypertension   . Diabetes type 1, uncontrolled 11/14/2011    Since age 48  . Gastroparesis    Past Surgical History  Procedure Laterality Date  . Ankle surgery    . Cholecystectomy  11/15/2011    Procedure: LAPAROSCOPIC CHOLECYSTECTOMY WITH INTRAOPERATIVE CHOLANGIOGRAM;  Surgeon: [name], MD;  Location: [facility];  Service: General;  Laterality: N/A;  . Laparoscopy  11/23/2011    Procedure: LAPAROSCOPY DIAGNOSTIC;  Surgeon: Edward Jolly, MD;  Location: [facility];  Service: General;  Laterality: N/A;  . Esophagogastroduodenoscopy  12/03/2011     Procedure: ESOPHAGOGASTRODUODENOSCOPY (EGD);  Surgeon: Beryle Beams, MD;  Location: [facility];  Service: Endoscopy;  Laterality: N/A;   Social History:  reports that she has never smoked. She has never used smokeless tobacco. She reports that she drinks alcohol. She reports that she does not use illicit drugs. Where does patient live home. Can patient participate in ADLs? Yes.  Allergies  Allergen Reactions  . Peanut-Containing Drug Products Anaphylaxis    [place] nuts specifically      Family History:  Family History  Problem Relation Age of Onset  . Diabetes Mother   . Hypertension Father       Prior to Admission medications   Medication Sig Start Date End Date Taking? Authorizing Provider  citalopram (CELEXA) 20 MG tablet Take 20 mg by mouth at bedtime. 04/15/12  Yes Shawnee Knapp, MD  cyclobenzaprine (FLEXERIL) 10 MG tablet Take 1 tablet (10 mg total) by mouth 3 (three) times daily as needed for muscle spasms (muscle pain). 04/17/13  Yes Janice Norrie, MD  dicyclomine (BENTYL) 10 MG capsule Take 10 mg by mouth 4 (four) times daily -  before meals and at bedtime.   Yes Historical Provider, MD  enalapril (VASOTEC) 10 MG tablet Take 10 mg by mouth every evening.    Yes Historical Provider, MD  gabapentin (NEURONTIN) 400 MG capsule Take 400 mg by mouth 3 (three) times daily. 400 mg in the morning 400 mg at noon and 800 mg at bedtime   Yes Historical Provider, MD  ibuprofen (ADVIL,MOTRIN) 600 MG tablet Take 600 mg by mouth every 6 (six) hours as needed.   Yes Historical Provider,  MD  insulin glargine (LANTUS) 100 UNIT/ML injection Inject 40 Units into the skin at bedtime.   Yes Historical Provider, MD  insulin lispro (HUMALOG) 100 UNIT/ML injection Inject 5-20 Units into the skin 4 (four) times daily -  before meals and at bedtime. Inject 5 units (base) and additional units according to sliding scale- sliding scale: CBG 70 - 120: 0 uunits: CBG 121 - 150: 2 units; CBG 151 - 200: 3 units; CBG  201 - 250: 5 uunits; CBG 251 - 300: 8 units;CBG 301 - 350: 11 units; CBG 351 - 400: 15 units; CBG > 400 Call MD   Yes Historical Provider, MD  metoCLOPramide (REGLAN) 10 MG tablet Take 1 tablet (10 mg total) by mouth 4 (four) times daily. 03/16/12  Yes Srikar Janna Arch, MD  oxyCODONE-acetaminophen (PERCOCET/ROXICET) 5-325 MG per tablet Take 1-2 tablets by mouth every 6 (six) hours as needed for severe pain. 04/24/13  Yes Tiffany Marilu Favre, PA-C  promethazine (PHENERGAN) 25 MG tablet Take 25 mg by mouth every 6 (six) hours as needed for nausea or vomiting. For nausea   Yes Historical Provider, MD    Physical Exam: Filed Vitals:   04/27/13 2351 04/27/13 2352 04/28/13 0102 04/28/13 0240  BP: 98/61 89/51 115/72 102/67  Pulse: 113 121 112 112  Temp:    98.1 F (36.7 C)  TempSrc:    Oral  Resp:    16  Height:    5' 7"  (1.702 m)  Weight:    56.1 kg (123 lb 10.9 oz)  SpO2:    100%     General:  Well-developed and moderately nourished.  Eyes: Anicteric no pallor.  ENT: No discharge from the ears eyes nose mouth.  Neck: No mass felt.  Cardiovascular: S1-S2 heard.  Respiratory: No rhonchi or crepitations.  Abdomen: Soft nontender bowel sounds present. No guarding rigidity.  Skin: No rash.  Musculoskeletal: No focal tenderness.  Psychiatric: Appears normal.  Neurologic: Alert awake oriented to time place and person. Moves all extremities 5 x 5.  Labs on Admission:  Basic Metabolic Panel:  Recent Labs Lab 04/22/13 1735 04/27/13 2105  NA 139 138  K 3.7 3.8  CL 96 96  CO2 27 30  GLUCOSE 293* 156*  BUN 10 12  CREATININE 0.38* 0.41*  CALCIUM 10.1 10.4   Liver Function Tests:  Recent Labs Lab 04/22/13 1735 04/27/13 2105  AST 18 18  ALT 29 21  ALKPHOS 97 88  BILITOT 0.3 0.3  PROT 8.1 8.1  ALBUMIN 4.5 4.1   No results found for this basename: LIPASE, AMYLASE,  in the last 168 hours No results found for this basename: AMMONIA,  in the last 168 hours CBC:  Recent  Labs Lab 04/22/13 1735 04/27/13 2105  WBC 5.2 5.2  NEUTROABS 2.6 2.0  HGB 14.5 14.5  HCT 41.2 40.3  MCV 85.7 84.7  PLT 230 233   Cardiac Enzymes:  Recent Labs Lab 04/22/13 1735 04/28/13 0130  CKTOTAL  --  36  TROPONINI <0.30 <0.30    BNP (last 3 results) No results found for this basename: PROBNP,  in the last 8760 hours CBG:  Recent Labs Lab 04/22/13 1715 04/24/13 0521 04/27/13 2036  GLUCAP 328* 140* 181*    Radiological Exams on Admission: No results found.  EKG: Independently reviewed. Sinus tachycardia.  Assessment/Plan Principal Problem:   Orthostatic hypotension Active Problems:   Hypertension associated with diabetes   Sinus tachycardia   Body aches   Diabetes mellitus type  1   1. Orthostatic hypotension - probably related to autonomic dysfunction. At this time continue with gentle hydration. Check orthostatic blood pressures in a.m. I'm holding a patient's lisinopril for now. Since patient has had significant tachycardia I have ordered thyroid function tests. Since patient also is complaining of chest tightness occasionally I have repeated d-dimer and will check cardiac markers and 2-D echo. Check cortisol levels. Check urine drug screen. 2. Sinus tachycardia - see #1. 3. Body aches - check CK levels. As per patient patient's rheumatologist is thinking if patient may have fibromyalgia. Patient is an is on pain relief medications including gabapentin and Percocet. For now I am continuing patient on when necessary IV Dilaudid. 4. Diabetes mellitus type 1 - continue present medications. 5. Hypertension - hold antihypertensives due to hypotensive.  I have reviewed patient's old charts labs.  Code Status: Full code.  Family Communication: Patient has been at the bedside.  Disposition Plan: Admit for observation.    Lisandro Meggett N. [facility] Pager [phone].  If 7PM-7AM, please contact night-coverage [email or web address] Password  [email or web address] 04/28/2013, 3:22 AM

## 2013-04-28 NOTE — ED Provider Notes (Signed)
Medical screening examination/treatment/procedure(s) were performed by non-physician practitioner and as supervising physician I was immediately available for consultation/collaboration.  EKG Interpretation    Date/Time:  Wednesday April 27 2013 19:45:31 EST Ventricular Rate:  149 PR Interval:  120 QRS Duration: 71 QT Interval:  292 QTC Calculation: 460 R Axis:   80 Text Interpretation:  Sinus tachycardia right atrial enlargement Non-specific ST-t changes Confirmed by Wilson Singer  MD, Mickel Schreur ([id number]) on 04/28/2013 12:33:31 AM             Virgel Manifold, MD 04/28/13 0202

## 2013-04-29 ENCOUNTER — Other Ambulatory Visit: Payer: Self-pay | Admitting: Nurse Practitioner

## 2013-04-29 LAB — URINE CULTURE: Colony Count: 30000

## 2013-04-29 LAB — GLUCOSE, CAPILLARY
Glucose-Capillary: 75 mg/dL (ref 70–99)
Glucose-Capillary: 186 mg/dL — ABNORMAL HIGH (ref 70–99)

## 2013-04-29 MED ORDER — METOPROLOL TARTRATE 12.5 MG HALF TABLET: 12.5000 mg | ORAL_TABLET | Freq: Two times a day (BID) | ORAL | Status: DC

## 2013-04-29 MED ORDER — METOPROLOL TARTRATE 12.5 MG HALF TABLET
12.5000 mg | ORAL_TABLET | Freq: Two times a day (BID) | ORAL | Status: DC
  Administered 2013-04-29: 12.5 mg via ORAL
  Filled 2013-04-29 (×2): qty 1

## 2013-04-29 MED ORDER — OXYCODONE-ACETAMINOPHEN 5-325 MG PO TABS: 1.0000 | ORAL_TABLET | Freq: Four times a day (QID) | ORAL | Status: DC | PRN

## 2013-04-29 MED ORDER — METOPROLOL TARTRATE 25 MG PO TABS: 12.5000 mg | ORAL_TABLET | Freq: Two times a day (BID) | ORAL | Status: DC

## 2013-04-29 MED ORDER — SODIUM CHLORIDE 0.9 % IV BOLUS (SEPSIS)
500.0000 mL | Freq: Once | INTRAVENOUS | Status: AC
  Administered 2013-04-29: 500 mL via INTRAVENOUS

## 2013-04-29 NOTE — Discharge Summary (Signed)
Physician Discharge Summary  Cassandre Oleksy Potteiger ZDG:[id number] DOB: 10-25-1990 DOA: 04/27/2013  PCP: Salena Saner., MD  Admit date: 04/27/2013 Discharge date: 04/29/2013  Time spent: 35 minutes  Recommendations for Outpatient Follow-up:  1. Follow up with Neurology in 3 days as previously scheduled 2. Follow up with PCP in 1-2 weeks   Discharge Diagnoses:  Principal Problem:   Orthostatic hypotension Active Problems:   Hypertension associated with diabetes   Sinus tachycardia   Body aches   Diabetes mellitus type 1   Protein-calorie malnutrition, severe  Discharge Condition: stable  Diet recommendation: diabetic  Filed Weights   04/27/13 1941 04/28/13 0240 04/29/13 0605  Weight: 54.885 kg (121 lb) 56.1 kg (123 lb 10.9 oz) 58.7 kg (129 lb 6.6 oz)   History of present illness:  [name] is a 23 y.o. female history of diabetes mellitus type 1 and hypertension has been experiencing generalized body aches over the last 2 months. Patient has been to patient neurologist and was referred to rheumatologist. As per the patient patient rheumatologist has had done ANA and rheumatoid factors which has come negative last week. Patient has been coming to the ER last 3 days and was discharged home with pain relief medications. Last night patient was in addition to weakness was found to be hypotensive and tachycardic with heart rate around 140 per minute. Patient was given 2 L normal saline bolus after which pressure and heart rate improved. Patient has been afebrile. D-dimer done 2 days ago was normal. Patient has been admitted for further observation hydration. Patient at times has chest tightness but present chest pain-free. Denies any nausea vomiting abdominal pain or diarrhea fever chills headache.   Hospital Course:  Orthostatic hypotension - probably related to autonomic dysfunction. Patient's hypotension improved after IV fluids. Since patient has had significant tachycardia TSH was  obtained and was normal. Since patient complained of chest tightness occasionally I have repeated d-dimer which was normal and cardiac markers (normal) and 2-D echo (normal as below). Cortisol levels normal.   Sinus tachycardia - see #1, this is chronic and has been documented for the past few years. Will start low dose metoprolol.  Body aches/chronic generalized pain - CK levels normal. As per patient patient's rheumatologist is thinking if patient may have fibromyalgia. Patient is an is on pain relief medications including gabapentin and Percocet. Patient underwent an extensive workup in the past for her chronic pain and is regularly seeing neurology and was recently established with rheumatology. She reports that her chronic generalized pain has been worsening for the past 2 months, since she started a new job. Patient expressed to me that she feels that she needs to look into disability given her chronic pain, and I will defer this to her PCP. Diabetes mellitus type 1 - continue present medications.  Procedures:  2D echo Study Conclusions Left ventricle: The cavity size was normal. Systolic function was vigorous. The estimated ejection fraction was in the range of 65% to 70%. Wall motion was normal; there were no regional wall motion abnormalities.   Consultations:  None   Discharge Exam: Filed Vitals:   04/29/13 0605 04/29/13 0607 04/29/13 0610 04/29/13 1016  BP: 125/75 114/73 100/67 105/62  Pulse: 119 121 130 115  Temp: 97.9 F (36.6 C)     TempSrc: Oral     Resp: 16     Height:      Weight: 58.7 kg (129 lb 6.6 oz)     SpO2: 99%  General: NAD Cardiovascular: RRR Respiratory: CTA biL  Discharge Instructions   Future Appointments Provider Department Dept Phone   05/04/2013 8:30 AM Philmore Pali, NP [facility] [phone]   06/30/2013 1:30 PM Penni Bombard, MD [facility] [phone]       Medication List         citalopram 20  MG tablet  Commonly known as:  CELEXA  Take 20 mg by mouth at bedtime.     cyclobenzaprine 10 MG tablet  Commonly known as:  FLEXERIL  Take 1 tablet (10 mg total) by mouth 3 (three) times daily as needed for muscle spasms (muscle pain).     dicyclomine 10 MG capsule  Commonly known as:  BENTYL  Take 10 mg by mouth 4 (four) times daily -  before meals and at bedtime.     enalapril 10 MG tablet  Commonly known as:  VASOTEC  Take 10 mg by mouth every evening.     gabapentin 400 MG capsule  Commonly known as:  NEURONTIN  Take 400 mg by mouth 3 (three) times daily. 400 mg in the morning 400 mg at noon and 800 mg at bedtime     ibuprofen 600 MG tablet  Commonly known as:  ADVIL,MOTRIN  Take 600 mg by mouth every 6 (six) hours as needed.     insulin glargine 100 UNIT/ML injection  Commonly known as:  LANTUS  Inject 40 Units into the skin at bedtime.     insulin lispro 100 UNIT/ML injection  Commonly known as:  HUMALOG  Inject 5-20 Units into the skin 4 (four) times daily -  before meals and at bedtime. Inject 5 units (base) and additional units according to sliding scale- sliding scale: CBG 70 - 120: 0 uunits: CBG 121 - 150: 2 units; CBG 151 - 200: 3 units; CBG 201 - 250: 5 uunits; CBG 251 - 300: 8 units;CBG 301 - 350: 11 units; CBG 351 - 400: 15 units; CBG > 400 Call MD     metoCLOPramide 10 MG tablet  Commonly known as:  REGLAN  Take 1 tablet (10 mg total) by mouth 4 (four) times daily.     metoprolol tartrate 25 MG tablet  Commonly known as:  LOPRESSOR  Take 0.5 tablets (12.5 mg total) by mouth 2 (two) times daily.     oxyCODONE-acetaminophen 5-325 MG per tablet  Commonly known as:  PERCOCET/ROXICET  Take 1-2 tablets by mouth every 6 (six) hours as needed for severe pain.     promethazine 25 MG tablet  Commonly known as:  PHENERGAN  Take 25 mg by mouth every 6 (six) hours as needed for nausea or vomiting. For nausea        The results of significant diagnostics from  this hospitalization (including imaging, microbiology, ancillary and laboratory) are listed below for reference.    Significant Diagnostic Studies: Dg Chest Portable 1 View  04/22/2013   CLINICAL DATA:  Chest discomfort. Body aches. Diabetes and hypertension.  EXAM: PORTABLE CHEST - 1 VIEW  COMPARISON:  02/10/2012  FINDINGS: The heart size and mediastinal contours are within normal limits. Both lungs are clear. The visualized skeletal structures are unremarkable.  IMPRESSION: No active disease.   Electronically Signed   By: Earle Gell M.D.   On: 04/22/2013 18:08   Microbiology: Recent Results (from the past 240 hour(s))  CULTURE, BLOOD (ROUTINE X 2)     Status: None   Collection Time    04/28/13  1:20 AM      Result Value Range Status   Specimen Description BLOOD RIGHT ARM   Final   Special Requests BOTTLES DRAWN AEROBIC AND ANAEROBIC 5CC   Final   Culture  Setup Time     Final   Value: 04/28/2013 04:02     Performed at [facility]   Culture     Final   Value:        BLOOD CULTURE RECEIVED NO GROWTH TO DATE CULTURE WILL BE HELD FOR 5 DAYS BEFORE ISSUING A FINAL NEGATIVE REPORT     Performed at [facility]   Report Status PENDING   Incomplete  CULTURE, BLOOD (ROUTINE X 2)     Status: None   Collection Time    04/28/13  1:30 AM      Result Value Range Status   Specimen Description BLOOD LEFT ARM   Final   Special Requests BOTTLES DRAWN AEROBIC AND ANAEROBIC 3CC   Final   Culture  Setup Time     Final   Value: 04/28/2013 04:02     Performed at [facility]   Culture     Final   Value:        BLOOD CULTURE RECEIVED NO GROWTH TO DATE CULTURE WILL BE HELD FOR 5 DAYS BEFORE ISSUING A FINAL NEGATIVE REPORT     Performed at [facility]   Report Status PENDING   Incomplete  URINE CULTURE     Status: None   Collection Time    04/28/13  2:40 AM      Result Value Range Status   Specimen Description URINE, RANDOM   Final   Special Requests NONE   Final    Culture  Setup Time     Final   Value: 04/28/2013 09:46     Performed at [facility] Count     Final   Value: 30,000 COLONIES/ML     Performed at [facility]   Culture     Final   Value: Multiple bacterial morphotypes present, none predominant. Suggest appropriate recollection if clinically indicated.     Performed at [facility]   Report Status 04/29/2013 FINAL   Final     Labs: Basic Metabolic Panel:  Recent Labs Lab 04/22/13 1735 04/27/13 2105 04/28/13 0732  NA 139 138 133*  K 3.7 3.8 4.4  CL 96 96 97  CO2 27 30 26   GLUCOSE 293* 156* 291*  BUN 10 12 10   CREATININE 0.38* 0.41* 0.36*  CALCIUM 10.1 10.4 8.4   Liver Function Tests:  Recent Labs Lab 04/22/13 1735 04/27/13 2105 04/28/13 0732  AST 18 18 16   ALT 29 21 16   ALKPHOS 97 88 65  BILITOT 0.3 0.3 0.3  PROT 8.1 8.1 5.9*  ALBUMIN 4.5 4.1 3.0*   CBC:  Recent Labs Lab 04/22/13 1735 04/27/13 2105 04/28/13 0732  WBC 5.2 5.2 4.6  NEUTROABS 2.6 2.0 1.5*  HGB 14.5 14.5 11.5*  HCT 41.2 40.3 32.6*  MCV 85.7 84.7 86.7  PLT 230 233 176   Cardiac Enzymes:  Recent Labs Lab 04/22/13 1735 04/28/13 0130 04/28/13 0732  CKTOTAL  --  36  --   TROPONINI <0.30 <0.30 <0.30   CBG:  Recent Labs Lab 04/28/13 1151 04/28/13 1650 04/28/13 2217 04/29/13 0757 04/29/13 1206  GLUCAP 158* 129* 228* 75 186*   Signed:  Joslin Doell  [facility] 04/29/2013, 3:24 PM

## 2013-05-03 ENCOUNTER — Encounter (HOSPITAL_COMMUNITY): Payer: Self-pay | Admitting: Emergency Medicine

## 2013-05-03 ENCOUNTER — Emergency Department (HOSPITAL_COMMUNITY)
Admission: EM | Admit: 2013-05-03 | Discharge: 2013-05-03 | Disposition: A | Discharge status: Home / Self Care | Payer: 59 | Attending: Emergency Medicine | Admitting: Emergency Medicine

## 2013-05-03 DIAGNOSIS — Z79899 Other long term (current) drug therapy: Secondary | ICD-10-CM | POA: Insufficient documentation

## 2013-05-03 DIAGNOSIS — Z8719 Personal history of other diseases of the digestive system: Secondary | ICD-10-CM | POA: Insufficient documentation

## 2013-05-03 DIAGNOSIS — Z794 Long term (current) use of insulin: Secondary | ICD-10-CM | POA: Insufficient documentation

## 2013-05-03 DIAGNOSIS — E1065 Type 1 diabetes mellitus with hyperglycemia: Secondary | ICD-10-CM | POA: Insufficient documentation

## 2013-05-03 DIAGNOSIS — R42 Dizziness and giddiness: Secondary | ICD-10-CM | POA: Insufficient documentation

## 2013-05-03 DIAGNOSIS — IMO0002 Reserved for concepts with insufficient information to code with codable children: Secondary | ICD-10-CM | POA: Insufficient documentation

## 2013-05-03 DIAGNOSIS — I10 Essential (primary) hypertension: Secondary | ICD-10-CM | POA: Insufficient documentation

## 2013-05-03 DIAGNOSIS — R079 Chest pain, unspecified: Secondary | ICD-10-CM

## 2013-05-03 DIAGNOSIS — R000 Tachycardia, unspecified: Secondary | ICD-10-CM

## 2013-05-03 LAB — CBC
HCT: 35.7 % — ABNORMAL LOW (ref 36.0–46.0)
Hemoglobin: 13 g/dL (ref 12.0–15.0)
MCH: 31.3 pg (ref 26.0–34.0)
MCHC: 36.4 g/dL — ABNORMAL HIGH (ref 30.0–36.0)
MCV: 86 fL (ref 78.0–100.0)
Platelets: 227 10*3/uL (ref 150–400)
RBC: 4.15 MIL/uL (ref 3.87–5.11)
RDW: 12 % (ref 11.5–15.5)
WBC: 5.3 10*3/uL (ref 4.0–10.5)

## 2013-05-03 LAB — URINALYSIS, ROUTINE W REFLEX MICROSCOPIC
Bilirubin Urine: NEGATIVE
Glucose, UA: >1000 mg/dL — AB
Hgb urine dipstick: NEGATIVE
Ketones, ur: 15 mg/dL — AB
Leukocytes, UA: NEGATIVE
Nitrite: NEGATIVE
Protein, ur: NEGATIVE mg/dL
Specific Gravity, Urine: 1.045 — ABNORMAL HIGH (ref 1.005–1.030)
Urobilinogen, UA: 1 mg/dL (ref 0.0–1.0)
pH: 7 (ref 5.0–8.0)

## 2013-05-03 LAB — BASIC METABOLIC PANEL
BUN: 9 mg/dL (ref 6–23)
CO2: 30 mEq/L (ref 19–32)
Calcium: 9.9 mg/dL (ref 8.4–10.5)
Chloride: 96 mEq/L (ref 96–112)
Creatinine, Ser: 0.38 mg/dL — ABNORMAL LOW (ref 0.50–1.10)
GFR calc Af Amer: >90 mL/min (ref >90)
GFR calc non Af Amer: >90 mL/min (ref >90)
Glucose, Bld: 212 mg/dL — ABNORMAL HIGH (ref 70–99)
Potassium: 3.8 mEq/L (ref 3.7–5.3)
Sodium: 137 mEq/L (ref 137–147)

## 2013-05-03 LAB — POCT I-STAT TROPONIN I: Troponin i, poc: 0 ng/mL (ref 0.00–0.08)

## 2013-05-03 LAB — URINE MICROSCOPIC-ADD ON

## 2013-05-03 LAB — GLUCOSE, CAPILLARY: Glucose-Capillary: 228 mg/dL — ABNORMAL HIGH (ref 70–99)

## 2013-05-03 MED ORDER — HYDROMORPHONE HCL PF 1 MG/ML IJ SOLN
1.0000 mg | Freq: Once | INTRAMUSCULAR | Status: AC
  Administered 2013-05-03: 1 mg via INTRAVENOUS
  Filled 2013-05-03: qty 1

## 2013-05-03 MED ORDER — SODIUM CHLORIDE 0.9 % IV BOLUS (SEPSIS)
1000.0000 mL | Freq: Once | INTRAVENOUS | Status: AC
  Administered 2013-05-03: 1000 mL via INTRAVENOUS

## 2013-05-03 NOTE — Discharge Instructions (Signed)
Continue taking the metoprolol. Please followup with cardiology as directed.   Chest Pain (Nonspecific) It is often hard to give a specific diagnosis for the cause of chest pain. There is always a chance that your pain could be related to something serious, such as a heart attack or a blood clot in the lungs. You need to follow up with your caregiver for further evaluation. CAUSES   Heartburn.  Pneumonia or bronchitis.  Anxiety or stress.  Inflammation around your heart (pericarditis) or lung (pleuritis or pleurisy).  A blood clot in the lung.  A collapsed lung (pneumothorax). It can develop suddenly on its own (spontaneous pneumothorax) or from injury (trauma) to the chest.  Shingles infection (herpes zoster virus). The chest wall is composed of bones, muscles, and cartilage. Any of these can be the source of the pain.  The bones can be bruised by injury.  The muscles or cartilage can be strained by coughing or overwork.  The cartilage can be affected by inflammation and become sore (costochondritis). DIAGNOSIS  Lab tests or other studies, such as X-rays, electrocardiography, stress testing, or cardiac imaging, may be needed to find the cause of your pain.  TREATMENT   Treatment depends on what may be causing your chest pain. Treatment may include:  Acid blockers for heartburn.  Anti-inflammatory medicine.  Pain medicine for inflammatory conditions.  Antibiotics if an infection is present.  You may be advised to change lifestyle habits. This includes stopping smoking and avoiding alcohol, caffeine, and chocolate.  You may be advised to keep your head raised (elevated) when sleeping. This reduces the chance of acid going backward from your stomach into your esophagus.  Most of the time, nonspecific chest pain will improve within 2 to 3 days with rest and mild pain medicine. HOME CARE INSTRUCTIONS   If antibiotics were prescribed, take your antibiotics as directed.  Finish them even if you start to feel better.  For the next few days, avoid physical activities that bring on chest pain. Continue physical activities as directed.  Do not smoke.  Avoid drinking alcohol.  Only take over-the-counter or prescription medicine for pain, discomfort, or fever as directed by your caregiver.  Follow your caregiver's suggestions for further testing if your chest pain does not go away.  Keep any follow-up appointments you made. If you do not go to an appointment, you could develop lasting (chronic) problems with pain. If there is any problem keeping an appointment, you must call to reschedule. SEEK MEDICAL CARE IF:   You think you are having problems from the medicine you are taking. Read your medicine instructions carefully.  Your chest pain does not go away, even after treatment.  You develop a rash with blisters on your chest. SEEK IMMEDIATE MEDICAL CARE IF:   You have increased chest pain or pain that spreads to your arm, neck, jaw, back, or abdomen.  You develop shortness of breath, an increasing cough, or you are coughing up blood.  You have severe back or abdominal pain, feel nauseous, or vomit.  You develop severe weakness, fainting, or chills.  You have a fever. THIS IS AN EMERGENCY. Do not wait to see if the pain will go away. Get medical help at once. Call your local emergency services (911 in [place] Do not drive yourself to the hospital. MAKE SURE YOU:   Understand these instructions.  Will watch your condition.  Will get help right away if you are not doing well or get worse. Document Released:  12/25/2004 Document Revised: 06/09/2011 Document Reviewed: 10/21/2007 [facility] Patient Information 2014 [place].

## 2013-05-03 NOTE — ED Provider Notes (Signed)
CSN: [id number]     Arrival date & time 05/03/13  1604 History   First MD Initiated Contact with Patient 05/03/13 1626     Chief Complaint  Patient presents with  . Chest Pain   (Consider location/radiation/quality/duration/timing/severity/associated sxs/prior Treatment) HPI Comments: Patient with a PMH of DM, HTN, and chronic tachycardia presents to the ED with a chief complaint of chest pain. She states that the pain started today around 2.  She has tried to control the pain with gabapentin and percocet with no relief.  She states that she feels short of breath when her heart races.  She endorses feeling dizzy.  Denies any syncopal episodes.  She was recently admitted on 1/28 for orthostasis and chest pain.  She was discharged on 1/30.  She had a normal 2D echo, and was started on metoprolol and given neurology follow-up.  Patient has been worked up recently for PE, RA, and lupus.  She states that her neurology appointment is tomorrow.    The history is provided by the patient. No language interpreter was used.    Past Medical History  Diagnosis Date  . Diabetes mellitus   . Hypertension   . Diabetes type 1, uncontrolled 11/14/2011    Since age 23  . Gastroparesis    Past Surgical History  Procedure Laterality Date  . Ankle surgery    . Cholecystectomy  11/15/2011    Procedure: LAPAROSCOPIC CHOLECYSTECTOMY WITH INTRAOPERATIVE CHOLANGIOGRAM;  Surgeon: Ardeth SportsmanSteven C. Gross, MD;  Location: [facility];  Service: General;  Laterality: N/A;  . Laparoscopy  11/23/2011    Procedure: LAPAROSCOPY DIAGNOSTIC;  Surgeon: Mariella SaaBenjamin T Hoxworth, MD;  Location: [facility];  Service: General;  Laterality: N/A;  . Esophagogastroduodenoscopy  12/03/2011    Procedure: ESOPHAGOGASTRODUODENOSCOPY (EGD);  Surgeon: Theda BelfastPatrick D Hung, MD;  [facility];  Service: Endoscopy;  Laterality: N/A;   Family History  Problem Relation Age of Onset  . Diabetes Mother   . Hypertension Father    History  Substance Use Topics   . Smoking status: Never Smoker   . Smokeless tobacco: Never Used  . Alcohol Use: Yes     Comment: Occasional   OB History   Grav Para Term Preterm Abortions TAB SAB Ect Mult Living                 Review of Systems  All other systems reviewed and are negative.    Allergies  Peanut-containing drug products  Home Medications   Current Outpatient Rx  Name  Route  Sig  Dispense  Refill  . citalopram (CELEXA) 20 MG tablet   Oral   Take 20 mg by mouth at bedtime.         . cyclobenzaprine (FLEXERIL) 10 MG tablet   Oral   Take 1 tablet (10 mg total) by mouth 3 (three) times daily as needed for muscle spasms (muscle pain).   30 tablet   0   . dicyclomine (BENTYL) 10 MG capsule   Oral   Take 10 mg by mouth 4 (four) times daily -  before meals and at bedtime.         . enalapril (VASOTEC) 10 MG tablet   Oral   Take 10 mg by mouth every evening.          . gabapentin (NEURONTIN) 400 MG capsule   Oral   Take 400 mg by mouth 3 (three) times daily. 400 mg in the morning 400 mg at noon and 800 mg at  bedtime         . ibuprofen (ADVIL,MOTRIN) 200 MG tablet   Oral   Take 600 mg by mouth every 6 (six) hours as needed (pain).         . insulin glargine (LANTUS) 100 UNIT/ML injection   Subcutaneous   Inject 40 Units into the skin at bedtime.         . insulin lispro (HUMALOG) 100 UNIT/ML injection   Subcutaneous   Inject 5-20 Units into the skin 4 (four) times daily -  before meals and at bedtime. Inject 5 units (base) and additional units according to sliding scale- sliding scale: CBG 70 - 120: 0 uunits: CBG 121 - 150: 2 units; CBG 151 - 200: 3 units; CBG 201 - 250: 5 uunits; CBG 251 - 300: 8 units;CBG 301 - 350: 11 units; CBG 351 - 400: 15 units; CBG > 400 Call MD         . metoCLOPramide (REGLAN) 10 MG tablet   Oral   Take 1 tablet (10 mg total) by mouth 4 (four) times daily.   120 tablet   0   . metoprolol tartrate (LOPRESSOR) 25 MG tablet   Oral   Take  0.5 tablets (12.5 mg total) by mouth 2 (two) times daily.   30 tablet   1   . oxyCODONE-acetaminophen (PERCOCET/ROXICET) 5-325 MG per tablet   Oral   Take 1-2 tablets by mouth every 6 (six) hours as needed for severe pain.   30 tablet   0   . promethazine (PHENERGAN) 25 MG tablet   Oral   Take 25 mg by mouth every 6 (six) hours as needed for nausea or vomiting. For nausea          BP 115/80  Pulse 140  Temp(Src) 98.3 F (36.8 C) (Oral)  Resp 22  SpO2 100%  LMP 02/14/2013 Physical Exam  Nursing note and vitals reviewed. Constitutional: She is oriented to person, place, and time. She appears well-developed and well-nourished.  HENT:  Head: Normocephalic and atraumatic.  Eyes: Conjunctivae and EOM are normal. Pupils are equal, round, and reactive to light.  Neck: Normal range of motion. Neck supple.  Cardiovascular: Regular rhythm.  Exam reveals no gallop and no friction rub.   No murmur heard. tachycardic  Pulmonary/Chest: Effort normal and breath sounds normal. No respiratory distress. She has no wheezes. She has no rales. She exhibits no tenderness.  Abdominal: Soft. Bowel sounds are normal. She exhibits no distension and no mass. There is no tenderness. There is no rebound and no guarding.  Musculoskeletal: Normal range of motion. She exhibits no edema and no tenderness.  Neurological: She is alert and oriented to person, place, and time.  Skin: Skin is warm and dry.  Psychiatric: She has a normal mood and affect. Her behavior is normal. Judgment and thought content normal.    ED Course  Procedures (including critical care time) Results for orders placed during the hospital encounter of 05/03/13  CBC      Result Value Range   WBC 5.3  4.0 - 10.5 K/uL   RBC 4.15  3.87 - 5.11 MIL/uL   Hemoglobin 13.0  12.0 - 15.0 g/dL   HCT 00.7 (*) 62.2 - 63.3 %   MCV 86.0  78.0 - 100.0 fL   MCH 31.3  26.0 - 34.0 pg   MCHC 36.4 (*) 30.0 - 36.0 g/dL   RDW 35.4  56.2 - 56.3 %    Platelets 227  150 - 400 K/uL  URINALYSIS, ROUTINE W REFLEX MICROSCOPIC      Result Value Range   Color, Urine YELLOW  YELLOW   APPearance CLEAR  CLEAR   Specific Gravity, Urine 1.045 (*) 1.005 - 1.030   pH 7.0  5.0 - 8.0   Glucose, UA >1000 (*) NEGATIVE mg/dL   Hgb urine dipstick NEGATIVE  NEGATIVE   Bilirubin Urine NEGATIVE  NEGATIVE   Ketones, ur 15 (*) NEGATIVE mg/dL   Protein, ur NEGATIVE  NEGATIVE mg/dL   Urobilinogen, UA 1.0  0.0 - 1.0 mg/dL   Nitrite NEGATIVE  NEGATIVE   Leukocytes, UA NEGATIVE  NEGATIVE  BASIC METABOLIC PANEL      Result Value Range   Sodium 137  137 - 147 mEq/L   Potassium 3.8  3.7 - 5.3 mEq/L   Chloride 96  96 - 112 mEq/L   CO2 30  19 - 32 mEq/L   Glucose, Bld 212 (*) 70 - 99 mg/dL   BUN 9  6 - 23 mg/dL   Creatinine, Ser 1.83 (*) 0.50 - 1.10 mg/dL   Calcium 9.9  8.4 - 35.8 mg/dL   GFR calc non Af Amer >90  >90 mL/min   GFR calc Af Amer >90  >90 mL/min  GLUCOSE, CAPILLARY      Result Value Range   Glucose-Capillary 228 (*) 70 - 99 mg/dL  URINE MICROSCOPIC-ADD ON      Result Value Range   Squamous Epithelial / LPF RARE  RARE   WBC, UA 0-2  <3 WBC/hpf  POCT I-STAT TROPONIN I      Result Value Range   Troponin i, poc 0.00  0.00 - 0.08 ng/mL   Comment 3            Dg Chest Portable 1 View  04/22/2013   CLINICAL DATA:  Chest discomfort. Body aches. Diabetes and hypertension.  EXAM: PORTABLE CHEST - 1 VIEW  COMPARISON:  02/10/2012  FINDINGS: The heart size and mediastinal contours are within normal limits. Both lungs are clear. The visualized skeletal structures are unremarkable.  IMPRESSION: No active disease.   Electronically Signed   By: Myles Rosenthal M.D.   On: 04/22/2013 18:08     No results found.  EKG Interpretation    Date/Time:  Tuesday May 03 2013 16:11:56 EST Ventricular Rate:  139 PR Interval:  112 QRS Duration: 68 QT Interval:  288 QTC Calculation: 438 R Axis:   70 Text Interpretation:  Sinus tachycardia Multiple premature  complexes, vent  No significant change since last tracing Confirmed by KNAPP  MD-J, JON ([id number]) on 05/03/2013 4:15:55 PM            MDM   1. Chest pain   2. Tachycardia     Patient with chest pain, HTN, DM, and chronic tachycardia.  This is believed to be due to fibromyalgia.  Patient was recently admitted for the same.  Doubt PE, recent d-dimer is negative.  The pain is the same.  Recent 2D echo:  Study Conclusions Left ventricle: The cavity size was normal. Systolic function was vigorous. The estimated ejection fraction was in the range of 65% to 70%. Wall motion was normal; there were no regional wall motion abnormalities.    6:36 PM Patient discussed with on-call cardiology, Dr. Royann Shivers?, who recommends that the patient call his office tomorrow and arrange for follow-up with Dr. Graciela Husbands.  He states that the patient can be discharged to home, continuing the metoprolol, despite the  tachycardia and orthostasis.  Discussed the patient with Dr. Freida Busman, who agrees with the plan, and has also seen the patient.  6:52 PM Patient reassessed.  She states that she is feeling better.  Patient seen by and discussed with Dr. Freida Busman, who agrees with the discharge plan as above.  Roxy Horseman, PA-C 05/03/13 1936

## 2013-05-03 NOTE — ED Notes (Signed)
Patient reports central chest pain that began at 1400 today. Patient states she was hospitalized last week for the same and stated that she could possibly have fibromyalgia.. Patient states she has an appointment with a neurologist tomorrow for possible fibromyalgia.

## 2013-05-03 NOTE — ED Provider Notes (Signed)
Medical screening examination/treatment/procedure(s) were conducted as a shared visit with non-physician practitioner(s) and myself.  I personally evaluated the patient during the encounter.  EKG Interpretation    Date/Time:  Tuesday May 03 2013 16:11:56 EST Ventricular Rate:  139 PR Interval:  112 QRS Duration: 68 QT Interval:  288 QTC Calculation: 438 R Axis:   70 Text Interpretation:  Sinus tachycardia Multiple premature complexes, vent  No significant change since last tracing Confirmed by KNAPP  MD-J, JON ([id number]) on 05/03/2013 4:15:55 PM             Leota Jacobsen, MD 05/03/13 2116

## 2013-05-04 ENCOUNTER — Encounter: Payer: Self-pay | Admitting: Nurse Practitioner

## 2013-05-04 ENCOUNTER — Encounter (INDEPENDENT_AMBULATORY_CARE_PROVIDER_SITE_OTHER): Payer: Self-pay

## 2013-05-04 ENCOUNTER — Ambulatory Visit (INDEPENDENT_AMBULATORY_CARE_PROVIDER_SITE_OTHER): Payer: 59 | Admitting: Nurse Practitioner

## 2013-05-04 VITALS — BP 124/78 | HR 118 | Ht 67.0 in | Wt 127.0 lb

## 2013-05-04 DIAGNOSIS — M2550 Pain in unspecified joint: Secondary | ICD-10-CM

## 2013-05-04 DIAGNOSIS — E1140 Type 2 diabetes mellitus with diabetic neuropathy, unspecified: Secondary | ICD-10-CM

## 2013-05-04 DIAGNOSIS — E1149 Type 2 diabetes mellitus with other diabetic neurological complication: Secondary | ICD-10-CM

## 2013-05-04 DIAGNOSIS — M797 Fibromyalgia: Secondary | ICD-10-CM

## 2013-05-04 DIAGNOSIS — E1142 Type 2 diabetes mellitus with diabetic polyneuropathy: Secondary | ICD-10-CM

## 2013-05-04 DIAGNOSIS — IMO0001 Reserved for inherently not codable concepts without codable children: Secondary | ICD-10-CM

## 2013-05-04 LAB — CULTURE, BLOOD (ROUTINE X 2)
Culture: NO GROWTH
Culture: NO GROWTH

## 2013-05-04 MED ORDER — GABAPENTIN 400 MG PO CAPS: 800.0000 mg | ORAL_CAPSULE | Freq: Four times a day (QID) | ORAL | Status: DC

## 2013-05-04 MED ORDER — DULOXETINE HCL 30 MG PO CPEP: 30.0000 mg | ORAL_CAPSULE | Freq: Every day | ORAL | Status: DC

## 2013-05-04 NOTE — Patient Instructions (Signed)
Increase Gabapentin 400 mg to 2 capsules (800 mg) four times daily. Start Cymbalta 30 mg daily for Fibromyalgia Pain.  After 2 weeks and tolerating ok, Stop Celexa. Have Cassandra call to get sooner appointment at Pain Management. Follow up with PCP for DM.  Follow up in 2 months.  Fibromyalgia Fibromyalgia is a disorder that is often misunderstood. It is associated with muscular pains and tenderness that comes and goes. It is often associated with fatigue and sleep disturbances. Though it tends to be long-lasting, fibromyalgia is not life-threatening. CAUSES  The exact cause of fibromyalgia is unknown. People with certain gene types are predisposed to developing fibromyalgia and other conditions. Certain factors can play a role as triggers, such as:  Spine disorders.  Arthritis.  Severe injury (trauma) and other physical stressors.  Emotional stressors. SYMPTOMS   The main symptom is pain and stiffness in the muscles and joints, which can vary over time.  Sleep and fatigue problems. Other related symptoms may include:  Bowel and bladder problems.  Headaches.  Visual problems.  Problems with odors and noises.  Depression or mood changes.  Painful periods (dysmenorrhea).  Dryness of the skin or eyes. DIAGNOSIS  There are no specific tests for diagnosing fibromyalgia. Patients can be diagnosed accurately from the specific symptoms they have. The diagnosis is made by determining that nothing else is causing the problems. TREATMENT  There is no cure. Management includes medicines and an active, healthy lifestyle. The goal is to enhance physical fitness, decrease pain, and improve sleep. HOME CARE INSTRUCTIONS   Only take over-the-counter or prescription medicines as directed by your caregiver. Sleeping pills, tranquilizers, and pain medicines may make your problems worse.  Low-impact aerobic exercise is very important and advised for treatment. At first, it may seem to make  pain worse. Gradually increasing your tolerance will overcome this feeling.  Learning relaxation techniques and how to control stress will help you. Biofeedback, visual imagery, hypnosis, muscle relaxation, yoga, and meditation are all options.  Anti-inflammatory medicines and physical therapy may provide short-term help.  Acupuncture or massage treatments may help.  Take muscle relaxant medicines as suggested by your caregiver.  Avoid stressful situations.  Plan a healthy lifestyle. This includes your diet, sleep, rest, exercise, and friends.  Find and practice a hobby you enjoy.  Join a fibromyalgia support group for interaction, ideas, and sharing advice. This may be helpful. SEEK MEDICAL CARE IF:  You are not having good results or improvement from your treatment. FOR MORE INFORMATION  National Fibromyalgia Association: www.fmaware.org Arthritis Foundation: www.arthritis.org Document Released: 03/17/2005 Document Revised: 06/09/2011 Document Reviewed: 06/27/2009 [facility] Patient Information 2014 [place], [place].

## 2013-05-04 NOTE — Progress Notes (Addendum)
PATIENT: [name] DOB: [birth year]   REASON FOR VISIT: follow up HISTORY FROM: patient  HISTORY OF PRESENT ILLNESS: 01/12/12 (VP): Patient reports history of diabetes since age 23 years old. Recent hemoglobin A1c 6.1. Since March 2013, she's developed numbness and tingling in her toes, feet, now extending up to her mid thighs. She's also developed stomach and back pain since June. No numbness or tingling in her fingers or hands.   UPDATE 03/18/13 (LL): Patient calls for revisit due to experiencing worsening pain in the last 2-3 weeks. Progression has been steadily worsening in that time. Patient had been prescribed Gabapentin 300 mg TID at last visit in 2013. It had been increased to 900 mg TID which she states "does not touch it." The pain in the last 2-3 weeks she describes as different, more in her joints. She describes the all-over pain as sharp and stabbing. Nothing helps the pain. She states that her ankles, knees, elbows and wrists feel like they are swelling and feel warm. She has tried taking Aleve which did not help. She denies fever, cough, rash, flu-like symptoms, headache, N/V/D. Her diabetes has been poorly controlled. She is trying to get it better controlled under the care of her PCP, Dr. Karlton Lemon.   UPDATE 05/04/13 (LL): Since last visit patient has had many ER visits for pain.  She was seen in the [facility] ER 12/17, 12/18, 12/27, 12/30 through 01/01 (admission for dehydration), 01/17, 01/18, 01/23, 01/25.  She has been to [facility] ED 01/06, 01/18, 01/21, 01/24 and there are concerns that she had 7 narcotic prescriptions filled this month alone per EDP at [facility].  All of her visits are for all over joint pains and arthralgias.  She was referred to Dr. Amil Amen, Rhuematology.  She tested negative for Lupus and RA. He reportedly believes she has fibromyalgia, I do not have the consultation report.  She is waiting for her referral appointnment with [facility].   Her pain is  described as diffuse, and constant.  Review of Systems  Out of a complete 14 system review, the patient complains of only the following symptoms, and all other reviewed systems are negative.  Neurological: Numbness Musculoskeletal: Joint pain Sleep: Insomnia  ALLERGIES: Allergies  Allergen Reactions  . Peanut-Containing Drug Products Anaphylaxis    [place] nuts specifically      HOME MEDICATIONS: Outpatient Prescriptions Prior to Visit  Medication Sig Dispense Refill  . citalopram (CELEXA) 20 MG tablet Take 20 mg by mouth at bedtime.      . cyclobenzaprine (FLEXERIL) 10 MG tablet Take 1 tablet (10 mg total) by mouth 3 (three) times daily as needed for muscle spasms (muscle pain).  30 tablet  0  . dicyclomine (BENTYL) 10 MG capsule Take 10 mg by mouth 4 (four) times daily -  before meals and at bedtime.      . enalapril (VASOTEC) 10 MG tablet Take 10 mg by mouth every evening.       Marland Kitchen ibuprofen (ADVIL,MOTRIN) 200 MG tablet Take 600 mg by mouth every 6 (six) hours as needed (pain).      . insulin glargine (LANTUS) 100 UNIT/ML injection Inject 40 Units into the skin at bedtime.      . insulin lispro (HUMALOG) 100 UNIT/ML injection Inject 5-20 Units into the skin 4 (four) times daily -  before meals and at bedtime. Inject 5 units (base) and additional units according to sliding scale- sliding scale: CBG 70 - 120: 0 uunits: CBG  121 - 150: 2 units; CBG 151 - 200: 3 units; CBG 201 - 250: 5 uunits; CBG 251 - 300: 8 units;CBG 301 - 350: 11 units; CBG 351 - 400: 15 units; CBG > 400 Call MD      . metoCLOPramide (REGLAN) 10 MG tablet Take 1 tablet (10 mg total) by mouth 4 (four) times daily.  120 tablet  0  . metoprolol tartrate (LOPRESSOR) 25 MG tablet Take 0.5 tablets (12.5 mg total) by mouth 2 (two) times daily.  30 tablet  1  . oxyCODONE-acetaminophen (PERCOCET/ROXICET) 5-325 MG per tablet Take 1-2 tablets by mouth every 6 (six) hours as needed for severe pain.  30 tablet  0  . promethazine  (PHENERGAN) 25 MG tablet Take 25 mg by mouth every 6 (six) hours as needed for nausea or vomiting. For nausea      . gabapentin (NEURONTIN) 400 MG capsule Take 400 mg by mouth 3 (three) times daily. 400 mg in the morning 400 mg at noon and 800 mg at bedtime       No facility-administered medications prior to visit.    PAST MEDICAL HISTORY: Past Medical History  Diagnosis Date  . Diabetes mellitus   . Hypertension   . Diabetes type 1, uncontrolled 11/14/2011    Since age 59  . Gastroparesis     PAST SURGICAL HISTORY: Past Surgical History  Procedure Laterality Date  . Ankle surgery    . Cholecystectomy  11/15/2011    Procedure: LAPAROSCOPIC CHOLECYSTECTOMY WITH INTRAOPERATIVE CHOLANGIOGRAM;  Surgeon: [name], MD;  Location: [facility];  Service: General;  Laterality: N/A;  . Laparoscopy  11/23/2011    Procedure: LAPAROSCOPY DIAGNOSTIC;  Surgeon: Edward Jolly, MD;  Location: [facility];  Service: General;  Laterality: N/A;  . Esophagogastroduodenoscopy  12/03/2011    Procedure: ESOPHAGOGASTRODUODENOSCOPY (EGD);  Surgeon: Beryle Beams, MD;  Location: [facility];  Service: Endoscopy;  Laterality: N/A;    FAMILY HISTORY: Family History  Problem Relation Age of Onset  . Diabetes Mother   . Hypertension Father     SOCIAL HISTORY: History   Social History  . Marital Status: Married    Spouse Name: sergio    Number of Children: 0  . Years of Education: college   Occupational History  . [employer]    Social History Main Topics  . Smoking status: Never Smoker   . Smokeless tobacco: Never Used  . Alcohol Use: Yes     Comment: Occasional  . Drug Use: No  . Sexual Activity: Yes   Other Topics Concern  . Not on file   Social History Narrative  . No narrative on file     PHYSICAL EXAM  Filed Vitals:   05/04/13 0825  BP: 124/78  Pulse: 118  Height: 5' 7"  (1.702 m)  Weight: 127 lb (57.607 kg)   Body mass index is 19.89 kg/(m^2).  Generalized: Well  developed, she appears distressed, tearful.  Head: normocephalic and atraumatic. Oropharynx benign  Neck: Supple, no carotid bruits  Cardiac: Regular rate rhythm, no murmur  Musculoskeletal: No deformity, NO VISIBLE SWELLING OF JOINTS.   Neurological examination  Mentation: Alert oriented to time, place, history taking. Follows all commands speech and language fluent  Cranial nerve II-XII: Fundoscopic exam reveals sharp disc margins.Pupils were equal round reactive to light extraocular movements were full, visual field were full on confrontational test. Facial sensation and strength were normal. hearing was intact to finger rubbing bilaterally. Uvula tongue midline. head turning  and shoulder shrug and were normal and symmetric.Tongue protrusion into cheek strength was normal.  Motor: normal bulk and tone, full strength in the BUE, BLE, fine finger movements normal, no pronator drift. No focal weakness  Sensory: normal and symmetric to light touch, pinprick, and vibration  Coordination: finger-nose-finger, heel-to-shin bilaterally, no dysmetria  Deep tendon reflexes in the upper and lower extremity are ABSENT and symmetric.  Gait and Station: Rising up from seated position without assistance, normal stance, without trunk ataxia, moderate stride, good arm swing, smooth turning, able to perform tiptoe, and heel walking without difficulty.   DIAGNOSTIC DATA (LABS, IMAGING, TESTING) - I reviewed patient records, labs, notes, testing and imaging myself where available.  Lab Results  Component Value Date   WBC 5.3 05/03/2013   HGB 13.0 05/03/2013   HCT 35.7* 05/03/2013   MCV 86.0 05/03/2013   PLT 227 05/03/2013      Component Value Date/Time   NA 137 05/03/2013 1700   K 3.8 05/03/2013 1700   CL 96 05/03/2013 1700   CO2 30 05/03/2013 1700   GLUCOSE 212* 05/03/2013 1700   BUN 9 05/03/2013 1700   CREATININE 0.38* 05/03/2013 1700   CREATININE 0.68 04/15/2012 1134   CALCIUM 9.9 05/03/2013 1700   PROT 5.9* 04/28/2013  0732   ALBUMIN 3.0* 04/28/2013 0732   AST 16 04/28/2013 0732   ALT 16 04/28/2013 0732   ALKPHOS 65 04/28/2013 0732   BILITOT 0.3 04/28/2013 0732   GFRNONAA >90 05/03/2013 1700   GFRAA >90 05/03/2013 1700   Lab Results  Component Value Date   CHOL 133 01/26/2009   HDL 57 01/26/2009   LDLCALC 62 01/26/2009   TRIG 72 01/26/2009   CHOLHDL 4.5 09/01/2008   Lab Results  Component Value Date   HGBA1C 11.9* 09/01/2012   Lab Results  Component Value Date   TSH 1.385 04/28/2013   Lab Results  Component Value Date   ESRSEDRATE 17 04/17/2013    ASSESSMENT AND PLAN 23 y.o. year old female has a past medical history of Diabetes mellitus; Hypertension; Diabetes type 1, uncontrolled (11/14/2011); and Gastroparesis here with diabetic neuropathy, diffuse arthralgias and probable Fibromyalgia.   PLAN:  Increase Gabapentin 400 mg to 2 capsules (800 mg) four times daily. Start Cymbalta 30 mg daily for Fibromyalgia Pain.  After 2 weeks and tolerating ok, Stop Celexa. We do not provide Narcotic medications for pain.  Have Cassandra call to see if we can get sooner appointment at Pain Management. Follow up with PCP for DM.  Follow up in 2 months.  No orders of the defined types were placed in this encounter.    Meds ordered this encounter  Medications  . gabapentin (NEURONTIN) 400 MG capsule    Sig: Take 2 capsules (800 mg total) by mouth 4 (four) times daily.    Dispense:  240 capsule    Refill:  5    Order Specific Question:  Supervising Provider    Answer:  Andrey Spearman R [[id number]]  . DULoxetine (CYMBALTA) 30 MG capsule    Sig: Take 1 capsule (30 mg total) by mouth daily.    Dispense:  30 capsule    Refill:  5    Order Specific Question:  Supervising Provider    Answer:  Penni Bombard [[id number]]   Return in about 2 months (around 07/02/2013).  Keep currently scheduled appointment.  Philmore Pali, MSN, NP-C 05/04/2013, 9:08 AM [facility] [address] [place], [place] [address] [phone]  Note: This document was prepared with digital dictation and possible smart phrase technology. Any transcriptional errors that result from this process are unintentional.  ~~~~~~~~~~~~~~~~~~~~~~~~~~~~~~~~~~~~~  I reviewed note and agree with plan.   Penni Bombard, MD 07/07/1789, 5:05 PM Certified in Neurology, Neurophysiology and Neuroimaging  [facility] [address] [place], [place] [address] [phone]

## 2013-05-05 ENCOUNTER — Other Ambulatory Visit: Payer: Self-pay

## 2013-05-05 ENCOUNTER — Telehealth: Payer: Self-pay

## 2013-05-05 DIAGNOSIS — IMO0001 Reserved for inherently not codable concepts without codable children: Secondary | ICD-10-CM

## 2013-05-05 DIAGNOSIS — Q000 Anencephaly: Secondary | ICD-10-CM

## 2013-05-05 DIAGNOSIS — M2550 Pain in unspecified joint: Secondary | ICD-10-CM

## 2013-05-05 DIAGNOSIS — E1140 Type 2 diabetes mellitus with diabetic neuropathy, unspecified: Secondary | ICD-10-CM

## 2013-05-05 DIAGNOSIS — E1142 Type 2 diabetes mellitus with diabetic polyneuropathy: Secondary | ICD-10-CM

## 2013-05-05 DIAGNOSIS — M797 Fibromyalgia: Secondary | ICD-10-CM

## 2013-05-05 NOTE — Telephone Encounter (Signed)
Pt's mother called back.  Was unsure why we called.  She stated that if you need to speak directly to [place] her cell phone # [phone].  Please call as necessary

## 2013-05-05 NOTE — Addendum Note (Signed)
Addended by: Charlott Holler E on: 05/05/2013 10:03 AM   Modules accepted: Orders

## 2013-05-05 NOTE — Telephone Encounter (Signed)
Spoke to patient. Let her know the status of referral for pain management. Advised referral sent to Dr. Eduard Clos office and they would be contacting her soon with an appt. Patient agreed.

## 2013-05-05 NOTE — Telephone Encounter (Signed)
Patient states she had a missed call from our office, please call her back.

## 2013-05-07 ENCOUNTER — Emergency Department (HOSPITAL_COMMUNITY)
Admission: EM | Admit: 2013-05-07 | Discharge: 2013-05-07 | Disposition: A | Discharge status: Home / Self Care | Payer: 59 | Attending: Emergency Medicine | Admitting: Emergency Medicine

## 2013-05-07 ENCOUNTER — Encounter (HOSPITAL_COMMUNITY): Payer: Self-pay | Admitting: Emergency Medicine

## 2013-05-07 ENCOUNTER — Emergency Department (HOSPITAL_COMMUNITY): Payer: 59

## 2013-05-07 DIAGNOSIS — E109 Type 1 diabetes mellitus without complications: Secondary | ICD-10-CM | POA: Insufficient documentation

## 2013-05-07 DIAGNOSIS — I10 Essential (primary) hypertension: Secondary | ICD-10-CM | POA: Insufficient documentation

## 2013-05-07 DIAGNOSIS — R079 Chest pain, unspecified: Secondary | ICD-10-CM

## 2013-05-07 DIAGNOSIS — Z794 Long term (current) use of insulin: Secondary | ICD-10-CM | POA: Insufficient documentation

## 2013-05-07 DIAGNOSIS — R000 Tachycardia, unspecified: Secondary | ICD-10-CM | POA: Insufficient documentation

## 2013-05-07 DIAGNOSIS — Z79899 Other long term (current) drug therapy: Secondary | ICD-10-CM | POA: Insufficient documentation

## 2013-05-07 LAB — CBC WITH DIFFERENTIAL/PLATELET
Basophils Absolute: 0 10*3/uL (ref 0.0–0.1)
Basophils Relative: 1 % (ref 0–1)
Eosinophils Absolute: 0.1 10*3/uL (ref 0.0–0.7)
Eosinophils Relative: 3 % (ref 0–5)
HCT: 37.2 % (ref 36.0–46.0)
Hemoglobin: 13.3 g/dL (ref 12.0–15.0)
Lymphocytes Relative: 45 % (ref 12–46)
Lymphs Abs: 1.9 10*3/uL (ref 0.7–4.0)
MCH: 30.9 pg (ref 26.0–34.0)
MCHC: 35.8 g/dL (ref 30.0–36.0)
MCV: 86.5 fL (ref 78.0–100.0)
Monocytes Absolute: 0.4 10*3/uL (ref 0.1–1.0)
Monocytes Relative: 10 % (ref 3–12)
Neutro Abs: 1.7 10*3/uL (ref 1.7–7.7)
Neutrophils Relative %: 41 % — ABNORMAL LOW (ref 43–77)
Platelets: 241 10*3/uL (ref 150–400)
RBC: 4.3 MIL/uL (ref 3.87–5.11)
RDW: 12.4 % (ref 11.5–15.5)
WBC: 4.1 10*3/uL (ref 4.0–10.5)

## 2013-05-07 LAB — BASIC METABOLIC PANEL
BUN: 5 mg/dL — ABNORMAL LOW (ref 6–23)
CO2: 30 mEq/L (ref 19–32)
Calcium: 9.9 mg/dL (ref 8.4–10.5)
Chloride: 96 mEq/L (ref 96–112)
Creatinine, Ser: 0.4 mg/dL — ABNORMAL LOW (ref 0.50–1.10)
GFR calc Af Amer: >90 mL/min (ref >90)
GFR calc non Af Amer: >90 mL/min (ref >90)
Glucose, Bld: 134 mg/dL — ABNORMAL HIGH (ref 70–99)
Potassium: 3.6 mEq/L — ABNORMAL LOW (ref 3.7–5.3)
Sodium: 137 mEq/L (ref 137–147)

## 2013-05-07 IMAGING — CR DG CHEST 2V
2 series · 2 of 2 positions shown · non-contrast
Comparison: DG CHEST 1V PORT dated [DATE]

CLINICAL DATA: Chest pain

EXAM:
CHEST  2 VIEW

[w chest pa]
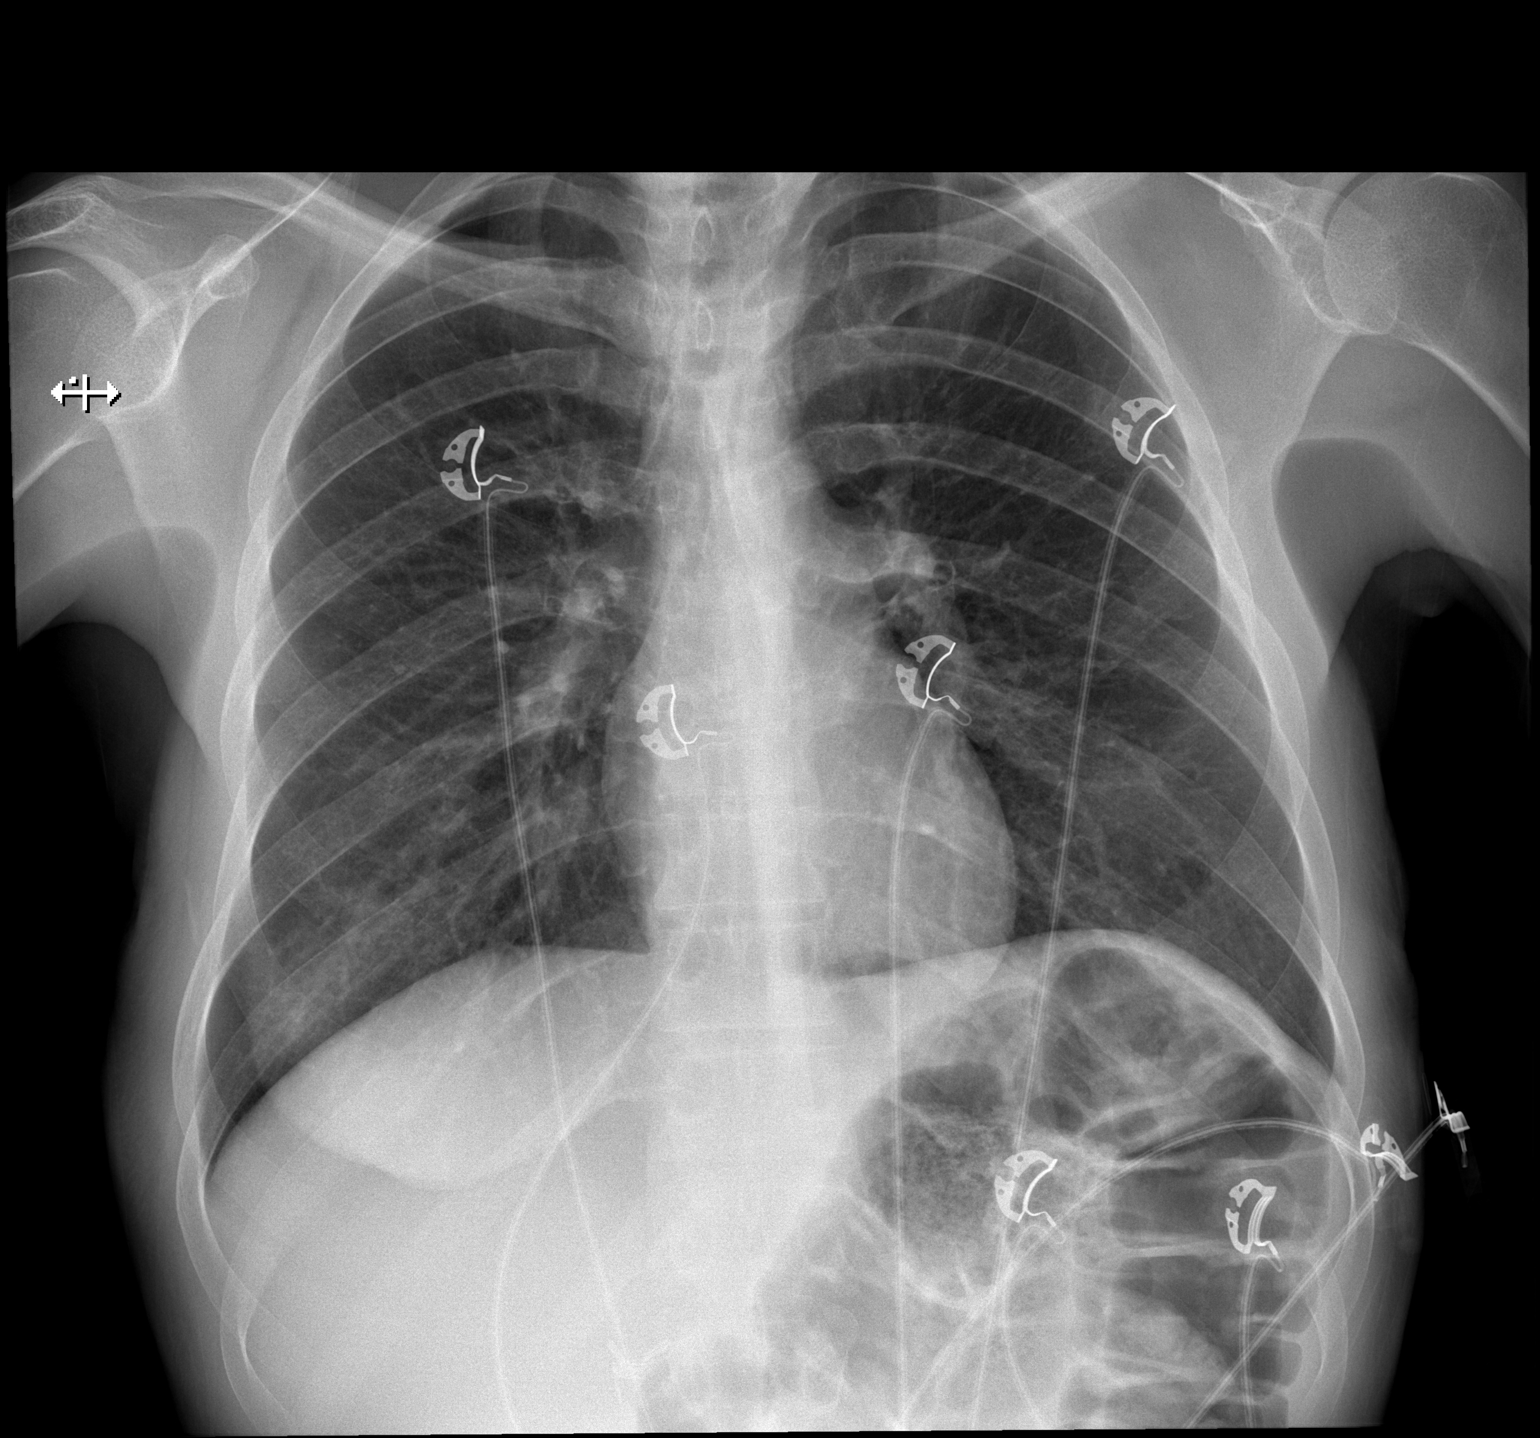

[w chest lat]
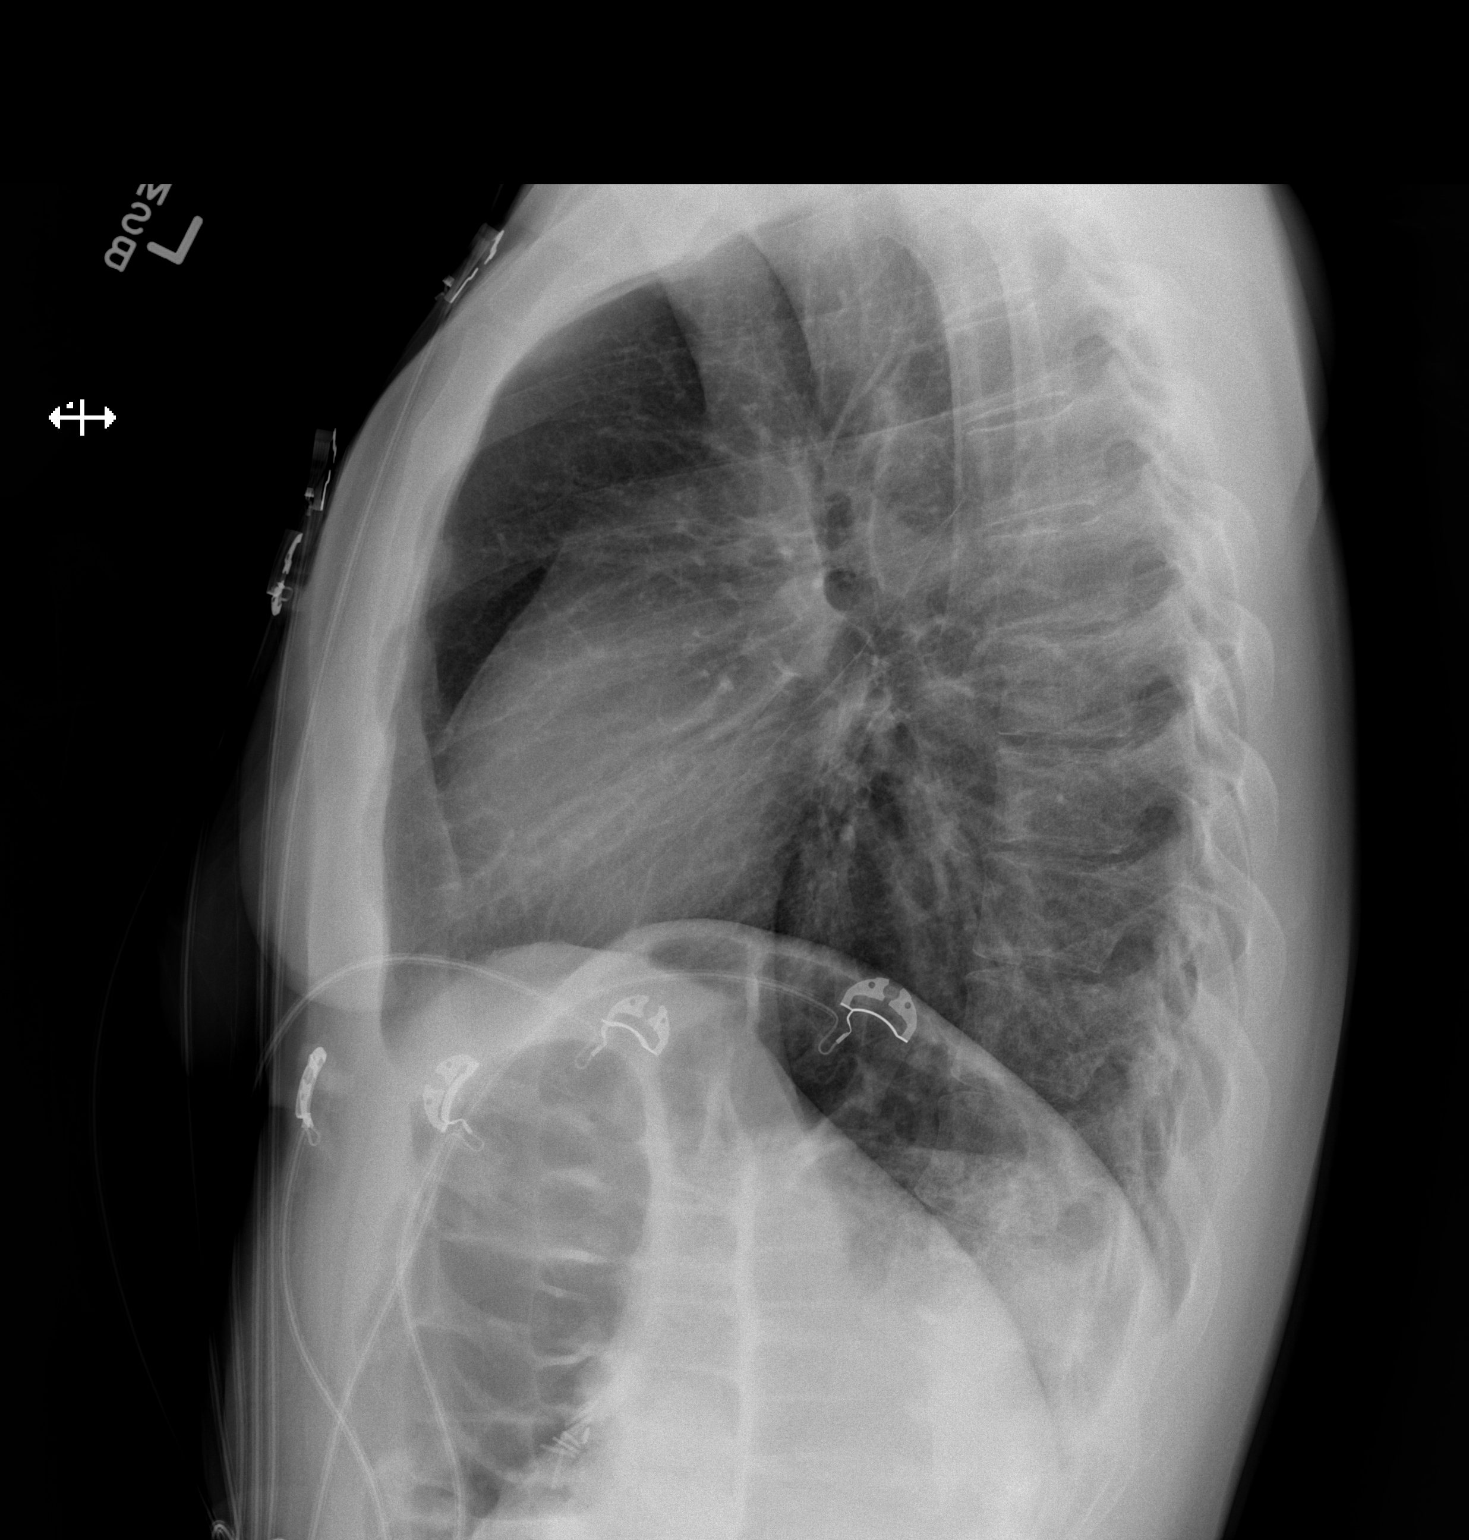

[2 of 2 positions shown; findings below may reference images not displayed]

FINDINGS: The heart size and mediastinal contours are within normal limits.
Both lungs are clear. The visualized skeletal structures are
unremarkable.
IMPRESSION: No active cardiopulmonary disease.

## 2013-05-07 MED ORDER — SODIUM CHLORIDE 0.9 % IV BOLUS (SEPSIS)
1000.0000 mL | Freq: Once | INTRAVENOUS | Status: AC
  Administered 2013-05-07: 1000 mL via INTRAVENOUS

## 2013-05-07 MED ORDER — HYDROMORPHONE HCL PF 2 MG/ML IJ SOLN
2.0000 mg | Freq: Once | INTRAMUSCULAR | Status: AC
  Administered 2013-05-07: 2 mg via INTRAMUSCULAR
  Filled 2013-05-07: qty 1

## 2013-05-07 MED ORDER — KETOROLAC TROMETHAMINE 30 MG/ML IJ SOLN
30.0000 mg | Freq: Once | INTRAMUSCULAR | Status: AC
  Administered 2013-05-07: 30 mg via INTRAVENOUS
  Filled 2013-05-07: qty 1

## 2013-05-07 MED ORDER — KETOROLAC TROMETHAMINE 60 MG/2ML IM SOLN: 60.0000 mg | Freq: Once | INTRAMUSCULAR | Status: DC

## 2013-05-07 MED ORDER — METOPROLOL TARTRATE 1 MG/ML IV SOLN
5.0000 mg | INTRAVENOUS | Status: DC | PRN
  Administered 2013-05-07: 5 mg via INTRAVENOUS
  Filled 2013-05-07: qty 5

## 2013-05-07 NOTE — ED Provider Notes (Signed)
CSN: [id number]     Arrival date & time 05/07/13  0602 History   First MD Initiated Contact with Patient 05/07/13 0703     Chief Complaint  Patient presents with  . Chest Pain    HPI The patient presents to the emergency room with complaints of sharp central chest pain that started about 4:45 this morning.   Patient states pain is severe. It is somewhat worse with palpation. Nothing seems to make it better. She denies any vomiting nausea or diarrhea. She denies any shortness of breath. She has not noticed any swelling. This complaint unfortunately is a recurrent issue for the patient. She has been to the hospital numerous times for evaluation. She has no history of pulmonary embolism and has had several CT scans of her chest in the past. Patient states she has been diagnosed with fibromyalgia. Past Medical History  Diagnosis Date  . Diabetes mellitus   . Hypertension   . Diabetes type 1, uncontrolled 11/14/2011    Since age 78  . Gastroparesis    Past Surgical History  Procedure Laterality Date  . Ankle surgery    . Cholecystectomy  11/15/2011    Procedure: LAPAROSCOPIC CHOLECYSTECTOMY WITH INTRAOPERATIVE CHOLANGIOGRAM;  Surgeon: [name], MD;  Location: [facility];  Service: General;  Laterality: N/A;  . Laparoscopy  11/23/2011    Procedure: LAPAROSCOPY DIAGNOSTIC;  Surgeon: Edward Jolly, MD;  Location: [facility];  Service: General;  Laterality: N/A;  . Esophagogastroduodenoscopy  12/03/2011    Procedure: ESOPHAGOGASTRODUODENOSCOPY (EGD);  Surgeon: Beryle Beams, MD;  Location: [facility];  Service: Endoscopy;  Laterality: N/A;   Family History  Problem Relation Age of Onset  . Diabetes Mother   . Hypertension Father    History  Substance Use Topics  . Smoking status: Never Smoker   . Smokeless tobacco: Never Used  . Alcohol Use: Yes     Comment: Occasional   OB History   Grav Para Term Preterm Abortions TAB SAB Ect Mult Living                 Review of Systems   Constitutional: Negative for fever.  Respiratory: Negative for cough and shortness of breath.   Cardiovascular: Positive for chest pain. Negative for leg swelling.  Gastrointestinal: Negative for nausea and vomiting.  Endocrine: Negative for polyuria.  Genitourinary: Negative for dysuria.  Skin: Negative for rash.  All other systems reviewed and are negative.    Allergies  Peanut-containing drug products  Home Medications   Current Outpatient Rx  Name  Route  Sig  Dispense  Refill  . citalopram (CELEXA) 20 MG tablet   Oral   Take 20 mg by mouth at bedtime.         . cyclobenzaprine (FLEXERIL) 10 MG tablet   Oral   Take 1 tablet (10 mg total) by mouth 3 (three) times daily as needed for muscle spasms (muscle pain).   30 tablet   0   . dicyclomine (BENTYL) 10 MG capsule   Oral   Take 10 mg by mouth 4 (four) times daily -  before meals and at bedtime.         . DULoxetine (CYMBALTA) 30 MG capsule   Oral   Take 1 capsule (30 mg total) by mouth daily.   30 capsule   5   . enalapril (VASOTEC) 10 MG tablet   Oral   Take 10 mg by mouth every evening.          Marland Kitchen  gabapentin (NEURONTIN) 400 MG capsule   Oral   Take 2 capsules (800 mg total) by mouth 4 (four) times daily.   240 capsule   5   . ibuprofen (ADVIL,MOTRIN) 200 MG tablet   Oral   Take 600 mg by mouth every 6 (six) hours as needed (pain).         . insulin glargine (LANTUS) 100 UNIT/ML injection   Subcutaneous   Inject 40 Units into the skin at bedtime.         . insulin lispro (HUMALOG) 100 UNIT/ML injection   Subcutaneous   Inject 5-20 Units into the skin 4 (four) times daily -  before meals and at bedtime. Inject 5 units (base) and additional units according to sliding scale- sliding scale: CBG 70 - 120: 0 uunits: CBG 121 - 150: 2 units; CBG 151 - 200: 3 units; CBG 201 - 250: 5 uunits; CBG 251 - 300: 8 units;CBG 301 - 350: 11 units; CBG 351 - 400: 15 units; CBG > 400 Call MD         .  metoCLOPramide (REGLAN) 10 MG tablet   Oral   Take 1 tablet (10 mg total) by mouth 4 (four) times daily.   120 tablet   0   . metoprolol tartrate (LOPRESSOR) 25 MG tablet   Oral   Take 0.5 tablets (12.5 mg total) by mouth 2 (two) times daily.   30 tablet   1   . oxyCODONE-acetaminophen (PERCOCET/ROXICET) 5-325 MG per tablet   Oral   Take 1-2 tablets by mouth every 6 (six) hours as needed for severe pain.   30 tablet   0   . promethazine (PHENERGAN) 25 MG tablet   Oral   Take 25 mg by mouth every 6 (six) hours as needed for nausea or vomiting. For nausea          BP 111/69  Pulse 121  Temp(Src) 98.4 F (36.9 C) (Oral)  Resp 13  Wt 127 lb (57.607 kg)  SpO2 100%  LMP 02/14/2013 Physical Exam  Nursing note and vitals reviewed. Constitutional: She appears well-developed and well-nourished.  Uncomfortable appearing  HENT:  Head: Normocephalic and atraumatic.  Right Ear: External ear normal.  Left Ear: External ear normal.  Eyes: Conjunctivae are normal. Right eye exhibits no discharge. Left eye exhibits no discharge. No scleral icterus.  Neck: Neck supple. No tracheal deviation present.  Cardiovascular: Regular rhythm and intact distal pulses.  Tachycardia present.   Pulmonary/Chest: Effort normal and breath sounds normal. No stridor. No respiratory distress. She has no wheezes. She has no rales.  Abdominal: Soft. Bowel sounds are normal. She exhibits no distension. There is no tenderness. There is no rebound and no guarding.  Musculoskeletal: She exhibits no edema and no tenderness.  Neurological: She is alert. She has normal strength. No cranial nerve deficit (no facial droop, extraocular movements intact, no slurred speech) or sensory deficit. She exhibits normal muscle tone. She displays no seizure activity. Coordination normal.  Skin: Skin is warm and dry. No rash noted.  Psychiatric: She has a normal mood and affect.    ED Course  Procedures (including critical  care time) Labs Review Labs Reviewed  CBC WITH DIFFERENTIAL - Abnormal; Notable for the following:    Neutrophils Relative % 41 (*)    All other components within normal limits  BASIC METABOLIC PANEL - Abnormal; Notable for the following:    Potassium 3.6 (*)    Glucose, Bld 134 (*)  BUN 5 (*)    Creatinine, Ser 0.40 (*)    All other components within normal limits   Imaging Review Dg Chest 2 View  05/07/2013   CLINICAL DATA:  Chest pain  EXAM: CHEST  2 VIEW  COMPARISON:  DG CHEST 1V PORT dated 04/22/2013  FINDINGS: The heart size and mediastinal contours are within normal limits. Both lungs are clear. The visualized skeletal structures are unremarkable.  IMPRESSION: No active cardiopulmonary disease.   Electronically Signed   By: Margaree Mackintosh M.D.   On: 05/07/2013 07:28    EKG Interpretation    Date/Time:  Saturday May 07 2013 07:41:58 EST Ventricular Rate:  120 PR Interval:  132 QRS Duration: 80 QT Interval:  317 QTC Calculation: 448 R Axis:   56 Text Interpretation:  Sinus tachycardia Borderline Q waves in inferior leads Borderline T abnormalities, inferior leads No significant change since last tracing except rate is slower Confirmed by Sayward Horvath  MD-J, Mertie Haslem ([id number]) on 05/07/2013 7:49:03 AM            MDM   1. Chest pain    Patient has a history of recurrent episodes of chest pain and chronic tachycardia. She has had extensive evaluations in the past including hospitalizations.  Patient most recently was discharged from the hospital in January.During her Evaluation she had serial negative d-dimer is in cardiac markers.    Patient states that this episode is very similar to her previous episodes. Her laboratory tests EKG and chest x-ray are unremarkable with the exception of her tachycardia. Her rate actually is slower than her previous levels of tachycardia today.  I doubt PE, dissection or cardiac etiology.  Patient was given a dose of pain medications with some  improvement. The heart rate is now 100 at the bedside.  At this time there does not appear to be any evidence of an acute emergency medical condition and the patient appears stable for discharge with appropriate outpatient follow up.  Patient will continue her Percocet and her Celexa. She is comfortable with this discharge plan.    Kathalene Frames, MD 05/07/13 [phone]

## 2013-05-07 NOTE — Discharge Instructions (Signed)
Chest Pain (Nonspecific) It is often hard to give a specific diagnosis for the cause of chest pain. There is always a chance that your pain could be related to something serious, such as a heart attack or a blood clot in the lungs. You need to follow up with your caregiver for further evaluation. CAUSES   Heartburn.  Pneumonia or bronchitis.  Anxiety or stress.  Inflammation around your heart (pericarditis) or lung (pleuritis or pleurisy).  A blood clot in the lung.  A collapsed lung (pneumothorax). It can develop suddenly on its own (spontaneous pneumothorax) or from injury (trauma) to the chest.  Shingles infection (herpes zoster virus). The chest wall is composed of bones, muscles, and cartilage. Any of these can be the source of the pain.  The bones can be bruised by injury.  The muscles or cartilage can be strained by coughing or overwork.  The cartilage can be affected by inflammation and become sore (costochondritis). DIAGNOSIS  Lab tests or other studies, such as X-rays, electrocardiography, stress testing, or cardiac imaging, may be needed to find the cause of your pain.  TREATMENT   Treatment depends on what may be causing your chest pain. Treatment may include:  Acid blockers for heartburn.  Anti-inflammatory medicine.  Pain medicine for inflammatory conditions.  Antibiotics if an infection is present.  You may be advised to change lifestyle habits. This includes stopping smoking and avoiding alcohol, caffeine, and chocolate.  You may be advised to keep your head raised (elevated) when sleeping. This reduces the chance of acid going backward from your stomach into your esophagus.  Most of the time, nonspecific chest pain will improve within 2 to 3 days with rest and mild pain medicine. HOME CARE INSTRUCTIONS   If antibiotics were prescribed, take your antibiotics as directed. Finish them even if you start to feel better.  For the next few days, avoid physical  activities that bring on chest pain. Continue physical activities as directed.  Do not smoke.  Avoid drinking alcohol.  Only take over-the-counter or prescription medicine for pain, discomfort, or fever as directed by your caregiver.  Follow your caregiver's suggestions for further testing if your chest pain does not go away.  Keep any follow-up appointments you made. If you do not go to an appointment, you could develop lasting (chronic) problems with pain. If there is any problem keeping an appointment, you must call to reschedule. SEEK MEDICAL CARE IF:   You think you are having problems from the medicine you are taking. Read your medicine instructions carefully.  Your chest pain does not go away, even after treatment.  You develop a rash with blisters on your chest. SEEK IMMEDIATE MEDICAL CARE IF:   You have increased chest pain or pain that spreads to your arm, neck, jaw, back, or abdomen.  You develop shortness of breath, an increasing cough, or you are coughing up blood.  You have severe back or abdominal pain, feel nauseous, or vomit.  You develop severe weakness, fainting, or chills.  You have a fever. THIS IS AN EMERGENCY. Do not wait to see if the pain will go away. Get medical help at once. Call your local emergency services (911 in [place] Do not drive yourself to the hospital. MAKE SURE YOU:   Understand these instructions.  Will watch your condition.  Will get help right away if you are not doing well or get worse. Document Released: 12/25/2004 Document Revised: 06/09/2011 Document Reviewed: 10/21/2007 [facility] Patient Information 2014 [place]. Fibromyalgia Fibromyalgia is a disorder that is often misunderstood. It is associated with muscular pains and tenderness that comes and goes. It is often associated with fatigue and sleep disturbances. Though it tends to be long-lasting, fibromyalgia is not life-threatening. CAUSES  The exact cause of  fibromyalgia is unknown. People with certain gene types are predisposed to developing fibromyalgia and other conditions. Certain factors can play a role as triggers, such as:  Spine disorders.  Arthritis.  Severe injury (trauma) and other physical stressors.  Emotional stressors. SYMPTOMS   The main symptom is pain and stiffness in the muscles and joints, which can vary over time.  Sleep and fatigue problems. Other related symptoms may include:  Bowel and bladder problems.  Headaches.  Visual problems.  Problems with odors and noises.  Depression or mood changes.  Painful periods (dysmenorrhea).  Dryness of the skin or eyes. DIAGNOSIS  There are no specific tests for diagnosing fibromyalgia. Patients can be diagnosed accurately from the specific symptoms they have. The diagnosis is made by determining that nothing else is causing the problems. TREATMENT  There is no cure. Management includes medicines and an active, healthy lifestyle. The goal is to enhance physical fitness, decrease pain, and improve sleep. HOME CARE INSTRUCTIONS   Only take over-the-counter or prescription medicines as directed by your caregiver. Sleeping pills, tranquilizers, and pain medicines may make your problems worse.  Low-impact aerobic exercise is very important and advised for treatment. At first, it may seem to make pain worse. Gradually increasing your tolerance will overcome this feeling.  Learning relaxation techniques and how to control stress will help you. Biofeedback, visual imagery, hypnosis, muscle relaxation, yoga, and meditation are all options.  Anti-inflammatory medicines and physical therapy may provide short-term help.  Acupuncture or massage treatments may help.  Take muscle relaxant medicines as suggested by your caregiver.  Avoid stressful situations.  Plan a healthy lifestyle. This includes your diet, sleep, rest, exercise, and friends.  Find and practice a hobby you  enjoy.  Join a fibromyalgia support group for interaction, ideas, and sharing advice. This may be helpful. SEEK MEDICAL CARE IF:  You are not having good results or improvement from your treatment. FOR MORE INFORMATION  National Fibromyalgia Association: www.fmaware.[place]: www.arthritis.org Document Released: 03/17/2005 Document Revised: 06/09/2011 Document Reviewed: 06/27/2009 [facility] Patient Information 2014 [place], [place].

## 2013-05-07 NOTE — ED Notes (Addendum)
Pt c/o central chest pain onset 0445, denies n/v/d. Pt states she started Lopressor last night.  Pt c/o generalized body pain, pt states she was recently dx with fibromyalgia.

## 2013-05-09 ENCOUNTER — Encounter (HOSPITAL_COMMUNITY): Payer: Self-pay | Admitting: Emergency Medicine

## 2013-05-09 ENCOUNTER — Emergency Department (HOSPITAL_COMMUNITY)
Admission: EM | Admit: 2013-05-09 | Discharge: 2013-05-10 | Disposition: A | Discharge status: Home / Self Care | Payer: 59 | Attending: Emergency Medicine | Admitting: Emergency Medicine

## 2013-05-09 ENCOUNTER — Emergency Department (HOSPITAL_COMMUNITY): Payer: 59

## 2013-05-09 DIAGNOSIS — Z794 Long term (current) use of insulin: Secondary | ICD-10-CM | POA: Insufficient documentation

## 2013-05-09 DIAGNOSIS — F411 Generalized anxiety disorder: Secondary | ICD-10-CM | POA: Insufficient documentation

## 2013-05-09 DIAGNOSIS — I10 Essential (primary) hypertension: Secondary | ICD-10-CM | POA: Insufficient documentation

## 2013-05-09 DIAGNOSIS — IMO0002 Reserved for concepts with insufficient information to code with codable children: Secondary | ICD-10-CM | POA: Insufficient documentation

## 2013-05-09 DIAGNOSIS — F419 Anxiety disorder, unspecified: Secondary | ICD-10-CM

## 2013-05-09 DIAGNOSIS — R000 Tachycardia, unspecified: Secondary | ICD-10-CM | POA: Insufficient documentation

## 2013-05-09 DIAGNOSIS — IMO0001 Reserved for inherently not codable concepts without codable children: Secondary | ICD-10-CM | POA: Insufficient documentation

## 2013-05-09 DIAGNOSIS — R079 Chest pain, unspecified: Secondary | ICD-10-CM

## 2013-05-09 DIAGNOSIS — Z79899 Other long term (current) drug therapy: Secondary | ICD-10-CM | POA: Insufficient documentation

## 2013-05-09 DIAGNOSIS — Z3202 Encounter for pregnancy test, result negative: Secondary | ICD-10-CM | POA: Insufficient documentation

## 2013-05-09 DIAGNOSIS — E1065 Type 1 diabetes mellitus with hyperglycemia: Secondary | ICD-10-CM | POA: Insufficient documentation

## 2013-05-09 DIAGNOSIS — R0789 Other chest pain: Secondary | ICD-10-CM | POA: Insufficient documentation

## 2013-05-09 DIAGNOSIS — Z8719 Personal history of other diseases of the digestive system: Secondary | ICD-10-CM | POA: Insufficient documentation

## 2013-05-09 DIAGNOSIS — R0602 Shortness of breath: Secondary | ICD-10-CM | POA: Insufficient documentation

## 2013-05-09 LAB — BASIC METABOLIC PANEL
BUN: 9 mg/dL (ref 6–23)
CO2: 21 mEq/L (ref 19–32)
Calcium: 10.5 mg/dL (ref 8.4–10.5)
Chloride: 93 mEq/L — ABNORMAL LOW (ref 96–112)
Creatinine, Ser: 0.38 mg/dL — ABNORMAL LOW (ref 0.50–1.10)
GFR calc Af Amer: >90 mL/min (ref >90)
GFR calc non Af Amer: >90 mL/min (ref >90)
Glucose, Bld: 215 mg/dL — ABNORMAL HIGH (ref 70–99)
Potassium: 3.7 mEq/L (ref 3.7–5.3)
Sodium: 135 mEq/L — ABNORMAL LOW (ref 137–147)

## 2013-05-09 LAB — CBC
HCT: 39.9 % (ref 36.0–46.0)
Hemoglobin: 14.3 g/dL (ref 12.0–15.0)
MCH: 30.6 pg (ref 26.0–34.0)
MCHC: 35.8 g/dL (ref 30.0–36.0)
MCV: 85.4 fL (ref 78.0–100.0)
Platelets: 289 10*3/uL (ref 150–400)
RBC: 4.67 MIL/uL (ref 3.87–5.11)
RDW: 12.4 % (ref 11.5–15.5)
WBC: 5.4 10*3/uL (ref 4.0–10.5)

## 2013-05-09 LAB — POCT I-STAT TROPONIN I: Troponin i, poc: 0 ng/mL (ref 0.00–0.08)

## 2013-05-09 LAB — POCT PREGNANCY, URINE: Preg Test, Ur: NEGATIVE

## 2013-05-09 IMAGING — CR DG CHEST 2V
2 series · 2 of 2 positions shown · non-contrast
Comparison: DG CHEST 2 VIEW dated [DATE]

CLINICAL DATA: Chest pain.

EXAM:
CHEST  2 VIEW

[w chest pa]
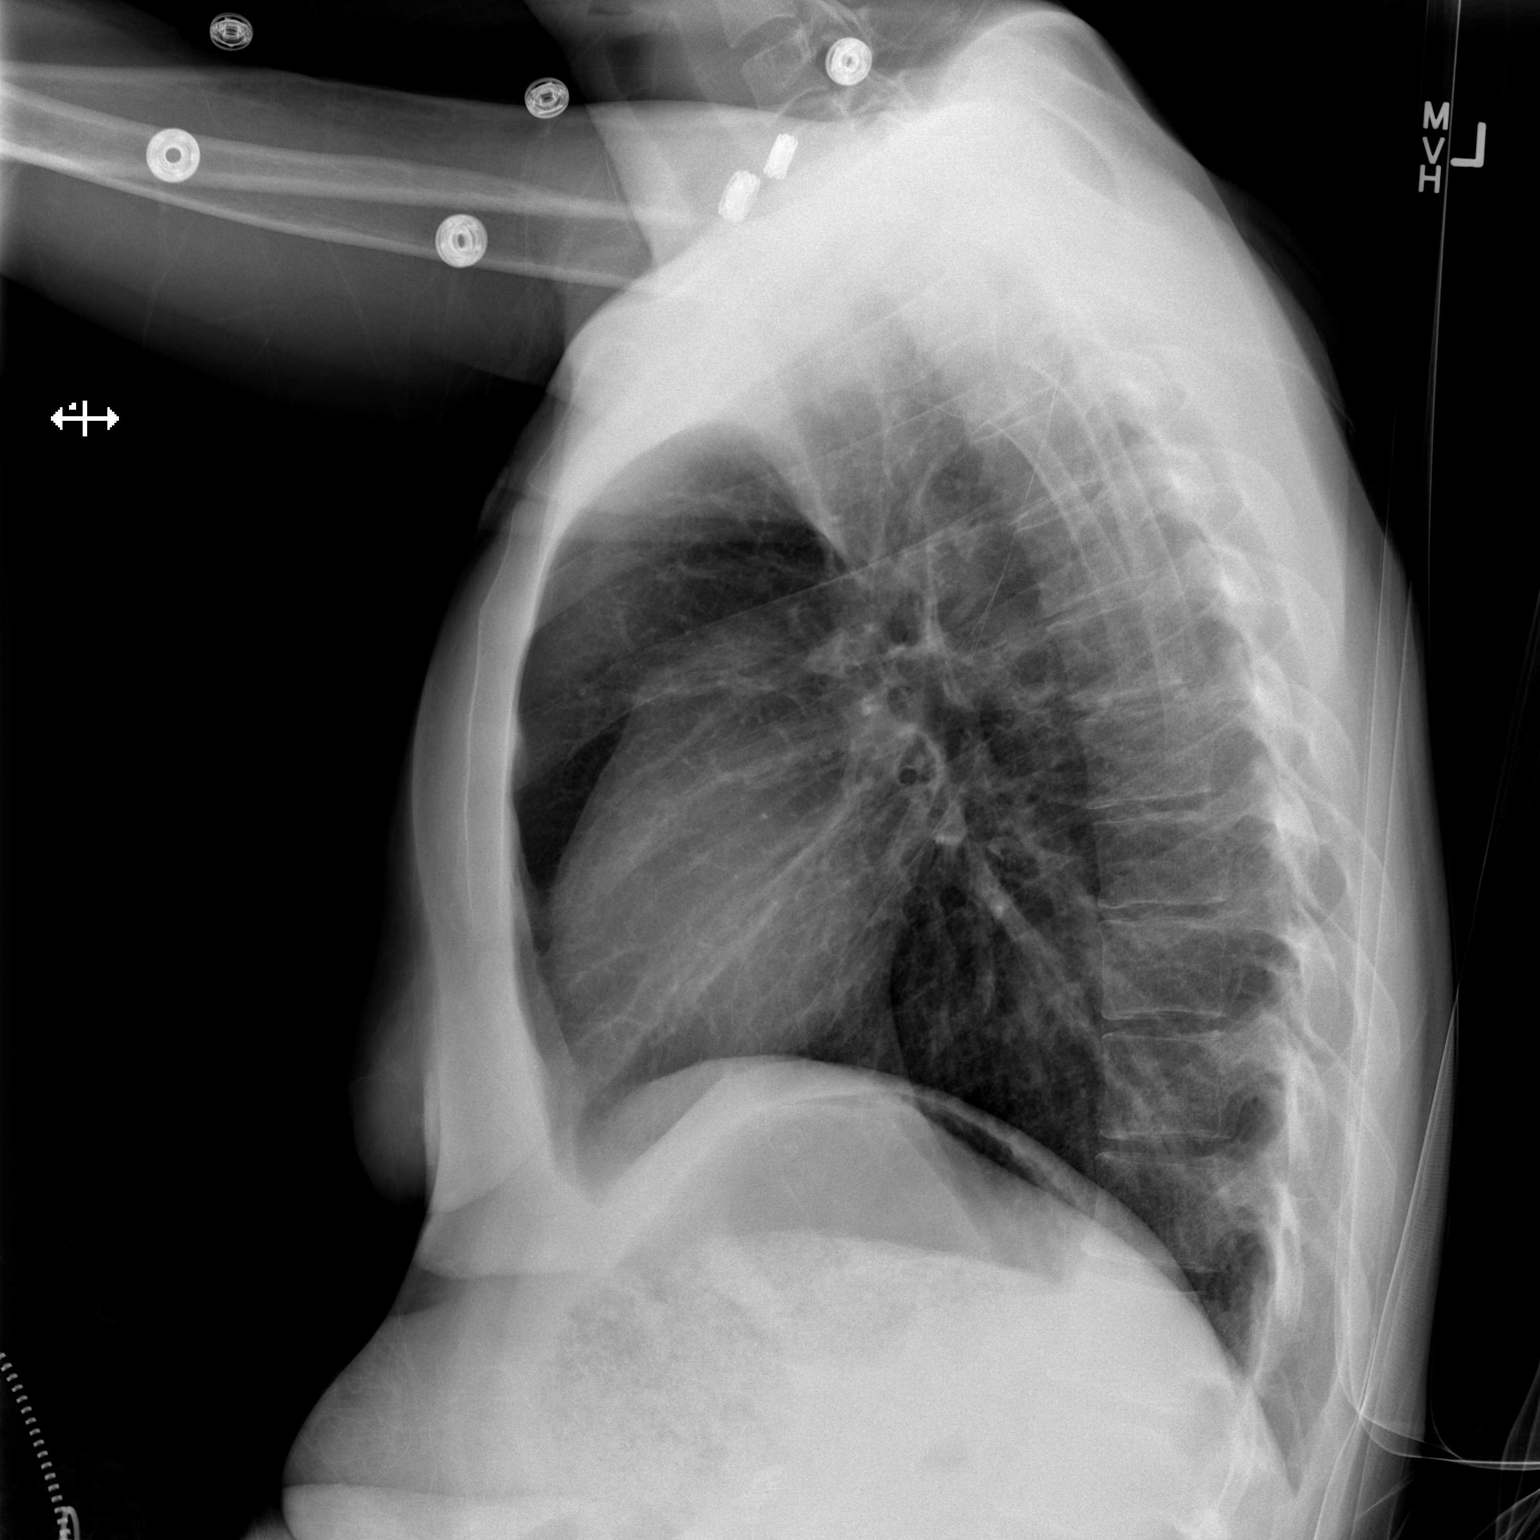

[x chest ap]
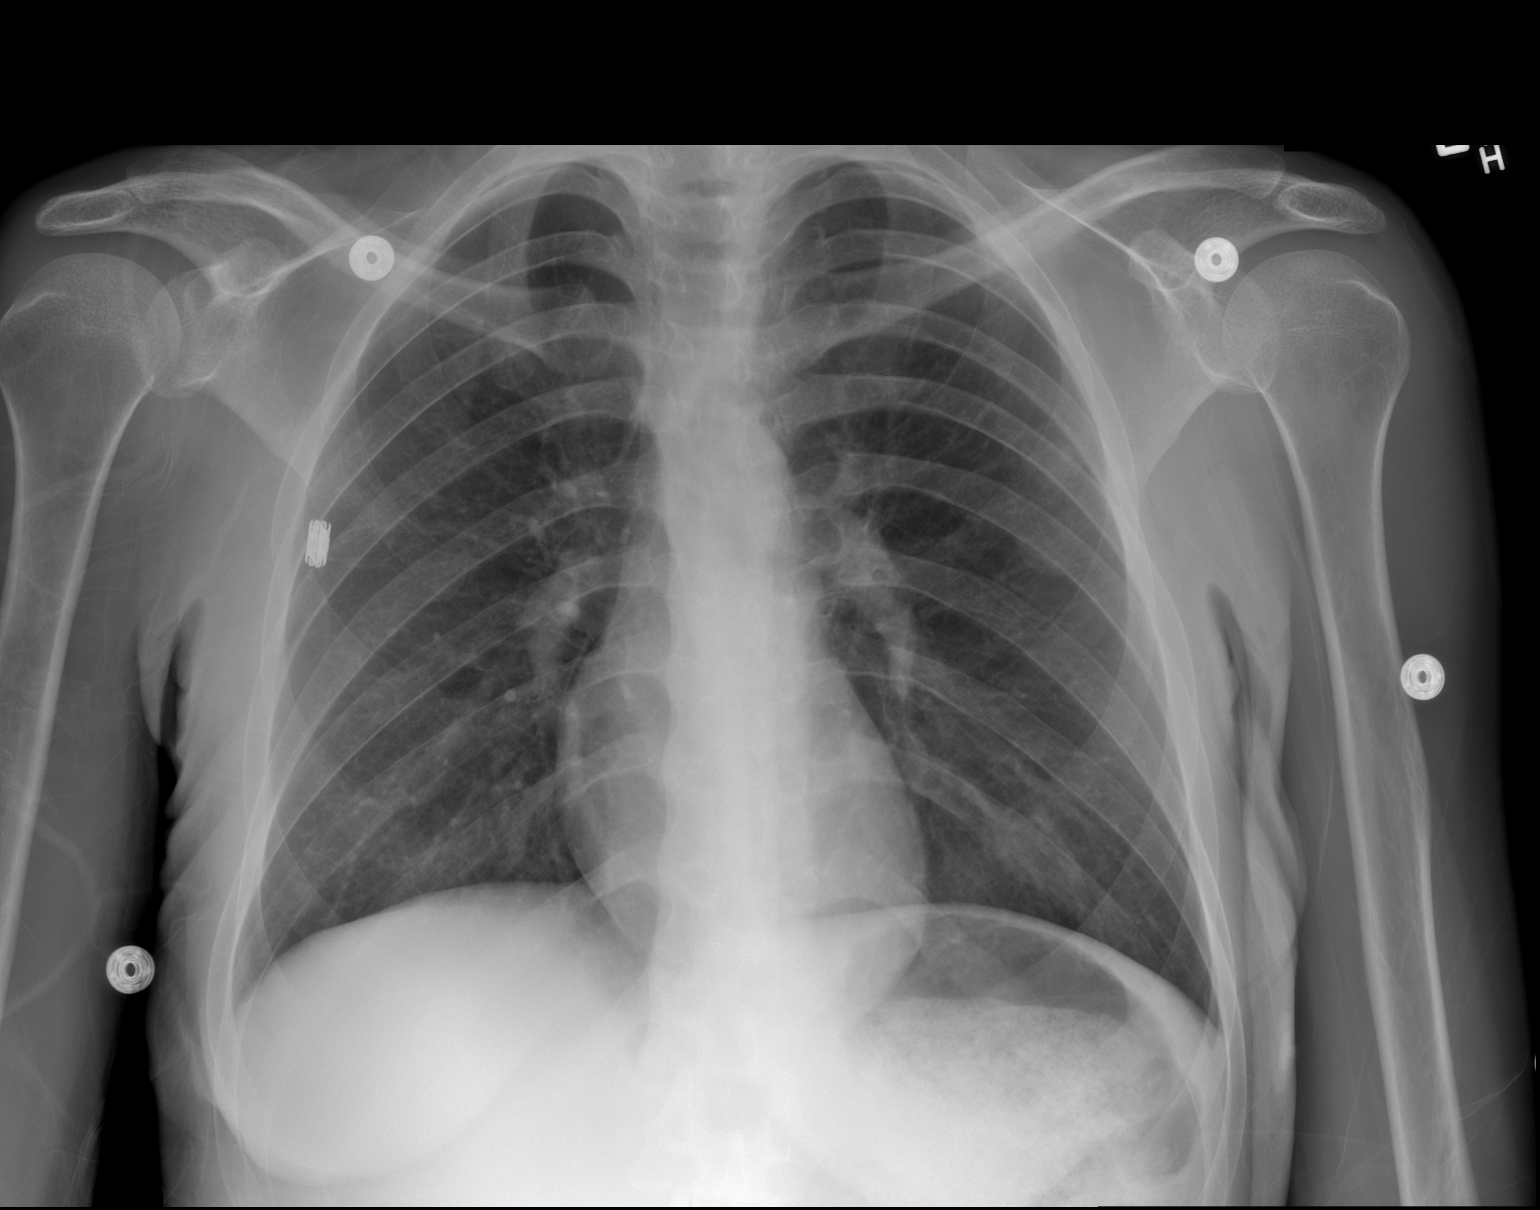

[2 of 2 positions shown; findings below may reference images not displayed]

FINDINGS: The heart size and mediastinal contours are within normal limits.
Both lungs are clear. The visualized skeletal structures are
unremarkable.
IMPRESSION: No active cardiopulmonary disease.

## 2013-05-09 MED ORDER — HYDROMORPHONE HCL PF 1 MG/ML IJ SOLN
0.5000 mg | Freq: Once | INTRAMUSCULAR | Status: AC
  Administered 2013-05-09: 0.5 mg via INTRAVENOUS
  Filled 2013-05-09: qty 1

## 2013-05-09 NOTE — ED Notes (Signed)
Patient has percocet at home and states she only took one dose (2tabs) prior to coming in.

## 2013-05-09 NOTE — ED Provider Notes (Signed)
CSN: [id number]     Arrival date & time 05/09/13  1829 History   First MD Initiated Contact with Patient 05/09/13 2037     Chief Complaint  Patient presents with  . Muscle Pain  . Chest Pain     (Consider location/radiation/quality/duration/timing/severity/associated sxs/prior Treatment) Patient is a 23 y.o. female presenting with musculoskeletal pain and chest pain. The history is provided by the patient. No language interpreter was used.  Muscle Pain Associated symptoms include chest pain and myalgias (Generalized body pain). Pertinent negatives include no chills, fever, nausea, vomiting or weakness.  Chest Pain Associated symptoms: shortness of breath   Associated symptoms: no dizziness, no fever, no nausea, not vomiting and no weakness   NORVELLE KULAGA is a 23 year old female with past medical history of diabetes, hypertension, gastroparesis, fibromyalgia presenting to emergency department with chest pain, shortness of breath, generalized body aches. Patient reports the chest pain or shortness of breath started yesterday at approximately 6:00 PM. Stated that the chest pain is described as a tightness localized to the Center the chest is constant and worsens with activity. Patient reports she has chest pain before but never like this. Stated that she took Percocet at 4:00 PM this afternoon with negative relief. Stated that she has pain all over described as a sharp and stabbing aching sensation that is constant. Stated that she experiences this body pain every 2-3 days. Patient reported that she was seen and assessed by her pain management specialist earlier this morning who changed her medications to a new muscle relaxer and water therapy to be started. Stated that she normally does have chest pain that is described as a tightness-this is nothing new-but reported that the intensity has increased. Denied fever, chills, nausea, vomiting, diarrhea, melena, he did, blurred vision, urinary symptoms,  difficulty breathing, visual disturbances, hemoptysis, swelling, long travels. PCP Dr. Renae Gloss  Past Medical History  Diagnosis Date  . Diabetes mellitus   . Hypertension   . Diabetes type 1, uncontrolled 11/14/2011    Since age 18  . Gastroparesis    Past Surgical History  Procedure Laterality Date  . Ankle surgery    . Cholecystectomy  11/15/2011    Procedure: LAPAROSCOPIC CHOLECYSTECTOMY WITH INTRAOPERATIVE CHOLANGIOGRAM;  Surgeon: [name], MD;  Location: [facility];  Service: General;  Laterality: N/A;  . Laparoscopy  11/23/2011    Procedure: LAPAROSCOPY DIAGNOSTIC;  Surgeon: Mariella Saa, MD;  Location: [facility];  Service: General;  Laterality: N/A;  . Esophagogastroduodenoscopy  12/03/2011    Procedure: ESOPHAGOGASTRODUODENOSCOPY (EGD);  Surgeon: Theda Belfast, MD;  Location: [facility];  Service: Endoscopy;  Laterality: N/A;   Family History  Problem Relation Age of Onset  . Diabetes Mother   . Hypertension Father    History  Substance Use Topics  . Smoking status: Never Smoker   . Smokeless tobacco: Never Used  . Alcohol Use: Yes     Comment: Occasional   OB History   Grav Para Term Preterm Abortions TAB SAB Ect Mult Living                 Review of Systems  Constitutional: Negative for fever and chills.  Respiratory: Positive for shortness of breath.   Cardiovascular: Positive for chest pain.  Gastrointestinal: Negative for nausea, vomiting and diarrhea.  Musculoskeletal: Positive for myalgias (Generalized body pain).  Neurological: Negative for dizziness and weakness.  All other systems reviewed and are negative.      Allergies  Peanut-containing drug products  Home Medications   Current Outpatient Rx  Name  Route  Sig  Dispense  Refill  . citalopram (CELEXA) 20 MG tablet   Oral   Take 20 mg by mouth at bedtime.         . cyclobenzaprine (FLEXERIL) 10 MG tablet   Oral   Take 1 tablet (10 mg total) by mouth 3 (three) times daily as  needed for muscle spasms (muscle pain).   30 tablet   0   . dicyclomine (BENTYL) 10 MG capsule   Oral   Take 10 mg by mouth 4 (four) times daily -  before meals and at bedtime.         . DULoxetine (CYMBALTA) 30 MG capsule   Oral   Take 30 mg by mouth at bedtime.         . enalapril (VASOTEC) 10 MG tablet   Oral   Take 10 mg by mouth every evening.          . gabapentin (NEURONTIN) 400 MG capsule   Oral   Take 2 capsules (800 mg total) by mouth 4 (four) times daily.   240 capsule   5   . insulin glargine (LANTUS) 100 UNIT/ML injection   Subcutaneous   Inject 40 Units into the skin at bedtime.         . insulin lispro (HUMALOG) 100 UNIT/ML injection   Subcutaneous   Inject 5-20 Units into the skin 4 (four) times daily -  before meals and at bedtime. Inject 5 units (base) and additional units according to sliding scale- sliding scale: CBG 70 - 120: 0 uunits: CBG 121 - 150: 2 units; CBG 151 - 200: 3 units; CBG 201 - 250: 5 uunits; CBG 251 - 300: 8 units;CBG 301 - 350: 11 units; CBG 351 - 400: 15 units; CBG > 400 Call MD         . metoCLOPramide (REGLAN) 10 MG tablet   Oral   Take 1 tablet (10 mg total) by mouth 4 (four) times daily.   120 tablet   0   . metoprolol tartrate (LOPRESSOR) 25 MG tablet   Oral   Take 0.5 tablets (12.5 mg total) by mouth 2 (two) times daily.   30 tablet   1   . oxyCODONE-acetaminophen (PERCOCET/ROXICET) 5-325 MG per tablet   Oral   Take 1-2 tablets by mouth every 6 (six) hours as needed for severe pain.   30 tablet   0   . promethazine (PHENERGAN) 25 MG tablet   Oral   Take 25 mg by mouth every 6 (six) hours as needed for nausea or vomiting. For nausea          BP 122/81  Pulse 124  Temp(Src) 97.9 F (36.6 C) (Oral)  Resp 19  Ht 5\' 7"  (1.702 m)  Wt 120 lb (54.432 kg)  BMI 18.79 kg/m2  SpO2 99%  LMP 02/14/2013 Physical Exam  Nursing note and vitals reviewed. Constitutional: She is oriented to person, place, and time.  She appears well-developed and well-nourished. No distress.  HENT:  Head: Normocephalic and atraumatic.  Mouth/Throat: Oropharynx is clear and moist. No oropharyngeal exudate.  Eyes: Conjunctivae and EOM are normal. Pupils are equal, round, and reactive to light. Right eye exhibits no discharge. Left eye exhibits no discharge.  Neck: Normal range of motion. Neck supple.  Cardiovascular: Regular rhythm and normal heart sounds.  Tachycardia present.  Exam reveals no friction rub.   No murmur heard. Pulses:  Radial pulses are 2+ on the right side, and 2+ on the left side.       Dorsalis pedis pulses are 2+ on the right side, and 2+ on the left side.  Pulmonary/Chest: Effort normal and breath sounds normal. No respiratory distress. She has no wheezes. She has no rales. She exhibits tenderness.  Discomfort upon palpation to the chest wall-Center the chest Negative use of accessory muscles Patient is able to speak in full sentences without difficulty  Abdominal: Soft. Bowel sounds are normal. There is tenderness. There is no guarding.  Musculoskeletal: Normal range of motion.  Diffuse tenderness upon superficial palpation to all extremities. Full ROM to upper and lower extremities without difficulty noted, negative ataxia noted.  Neurological: She is alert and oriented to person, place, and time. No cranial nerve deficit. She exhibits normal muscle tone. Coordination normal.  Cranial nerves III-XII grossly intact Strength 5+/5+ to upper and lower extremities bilaterally with resistance applied, equal distribution noted.  Skin: Skin is warm and dry. No rash noted. She is not diaphoretic. No erythema.  Psychiatric:  Patient appears anxious Patient tearful    ED Course  Procedures (including critical care time)  This provider reviewed patient's chart. Patient is been seen and assessed in the emergency department numerous times referring chest pain. Patient presents very similar. Patient was  recently seen on 05/03/2013 and 05/07/2013 regarding chest pain and tachycardia. Patient had an echocardiogram performed on 04/28/2013 with negative findings. Patient has been worked up numerous times regarding serial troponins negative findings. Patient continuously has tachycardia.  1:22 AM This provider re-assessed the patient. Patient reported that pain is moderately controlled. Patient stated that the pain has decreased and has become more tolerable. Discussed labs and imaging results in great detail with patient. Discussed plan for discharge. Patient's heart rate has decreased from 144 beats per minute to 114 beats per minute.  Results for orders placed during the hospital encounter of 05/09/13  CBC      Result Value Range   WBC 5.4  4.0 - 10.5 K/uL   RBC 4.67  3.87 - 5.11 MIL/uL   Hemoglobin 14.3  12.0 - 15.0 g/dL   HCT 78.239.9  95.636.0 - 21.346.0 %   MCV 85.4  78.0 - 100.0 fL   MCH 30.6  26.0 - 34.0 pg   MCHC 35.8  30.0 - 36.0 g/dL   RDW 08.612.4  57.811.5 - 46.915.5 %   Platelets 289  150 - 400 K/uL  BASIC METABOLIC PANEL      Result Value Range   Sodium 135 (*) 137 - 147 mEq/L   Potassium 3.7  3.7 - 5.3 mEq/L   Chloride 93 (*) 96 - 112 mEq/L   CO2 21  19 - 32 mEq/L   Glucose, Bld 215 (*) 70 - 99 mg/dL   BUN 9  6 - 23 mg/dL   Creatinine, Ser 6.290.38 (*) 0.50 - 1.10 mg/dL   Calcium 52.810.5  8.4 - 41.310.5 mg/dL   GFR calc non Af Amer >90  >90 mL/min   GFR calc Af Amer >90  >90 mL/min  D-DIMER, QUANTITATIVE      Result Value Range   D-Dimer, Quant <0.27  0.00 - 0.48 ug/mL-FEU  POCT I-STAT TROPONIN I      Result Value Range   Troponin i, poc 0.00  0.00 - 0.08 ng/mL   Comment 3           POCT PREGNANCY, URINE      Result  Value Range   Preg Test, Ur NEGATIVE  NEGATIVE  POCT I-STAT TROPONIN I      Result Value Range   Troponin i, poc 0.00  0.00 - 0.08 ng/mL   Comment 3            Dg Chest 2 View  05/09/2013   CLINICAL DATA:  Chest pain.  EXAM: CHEST  2 VIEW  COMPARISON:  DG CHEST 2 VIEW dated 05/07/2013   FINDINGS: The heart size and mediastinal contours are within normal limits. Both lungs are clear. The visualized skeletal structures are unremarkable.  IMPRESSION: No active cardiopulmonary disease.   Electronically Signed   By: Salome Holmes M.D.   On: 05/09/2013 21:36   Dg Chest 2 View  05/07/2013   CLINICAL DATA:  Chest pain  EXAM: CHEST  2 VIEW  COMPARISON:  DG CHEST 1V PORT dated 04/22/2013  FINDINGS: The heart size and mediastinal contours are within normal limits. Both lungs are clear. The visualized skeletal structures are unremarkable.  IMPRESSION: No active cardiopulmonary disease.   Electronically Signed   By: Salome Holmes M.D.   On: 05/07/2013 07:28    Labs Review Labs Reviewed  BASIC METABOLIC PANEL - Abnormal; Notable for the following:    Sodium 135 (*)    Chloride 93 (*)    Glucose, Bld 215 (*)    Creatinine, Ser 0.38 (*)    All other components within normal limits  CBC  D-DIMER, QUANTITATIVE  POCT I-STAT TROPONIN I  POCT PREGNANCY, URINE  POCT I-STAT TROPONIN I   Imaging Review Dg Chest 2 View  05/09/2013   CLINICAL DATA:  Chest pain.  EXAM: CHEST  2 VIEW  COMPARISON:  DG CHEST 2 VIEW dated 05/07/2013  FINDINGS: The heart size and mediastinal contours are within normal limits. Both lungs are clear. The visualized skeletal structures are unremarkable.  IMPRESSION: No active cardiopulmonary disease.   Electronically Signed   By: Salome Holmes M.D.   On: 05/09/2013 21:36    EKG Interpretation    Date/Time:  Monday May 09 2013 22:47:04 EST Ventricular Rate:  126 PR Interval:  131 QRS Duration: 72 QT Interval:  296 QTC Calculation: 428 R Axis:   76 Text Interpretation:  Sinus tachycardia Probable left atrial enlargement Borderline T wave abnormalities No significant change since last tracing Confirmed by [facility]  MD, MARTHA [phone]) on 05/10/2013 12:27:42 AM            MDM   Final diagnoses:  Chest pain  Anxiety   Medications  HYDROmorphone (DILAUDID)  injection 0.5 mg (0.5 mg Intravenous Given 05/09/13 2319)  HYDROmorphone (DILAUDID) injection 1 mg (1 mg Intravenous Given 05/10/13 0043)  sodium chloride 0.9 % bolus 500 mL (500 mLs Intravenous New Bag/Given 05/10/13 0043)   Filed Vitals:   05/09/13 1932 05/09/13 2012 05/09/13 2306  BP: 111/76 97/62 122/81  Pulse: 149 144 124  Temp: 98.9 F (37.2 C) 97.9 F (36.6 C) 97.9 F (36.6 C)  TempSrc: Oral Oral Oral  Resp: 20 24 19   Height:  5\' 7"  (1.702 m)   Weight:  120 lb (54.432 kg)   SpO2: 97% 100% 99%    Patient presenting to emergency department with generalized eye pain, chest pain or shortness of breath. Reported that the chest pain and shortness of breath started yesterday at approximately 6:00 PM. Stated that the chest pain is localized to the Center the chest described as a tightness that is constant with associated shortness of breath-most  discomfort with activity. Reported that she is generalized body pain described as a constant stabbing, aching sensation. Reported that she was seen and assessed by a pain management specialist earlier this morning to change her medications to a new muscle relaxer and water therapy. This provider has reviewed patient's chart. Patient was seen and assessed on 05/03/2013 and 05/07/2013 regarding similar symptoms of chest pain. Patient was admitted to hospital back in January 2015-echocardiogram was performed in 04/28/2013 with negative findings noted. Serial troponins performed with negative elevation. EKG continues to be tachycardic when patient arrives emergency department. Alert and oriented. GCS 15. Tachycardia noted. Radial and DP pulses 2+ bilaterally. Lungs clear to auscultation to upper and lower lobes bilaterally. Refill less than 3 seconds. Negative swelling or pitting edema localized to lower extremities. Full range of motion to upper and lower extremities bilaterally without difficulty or ataxia noted. Strength intact to upper and lower extremities  with equal distribution. Patient appears anxious. Superficial discomfort upon palpation to all extremities. EKG noted sinus tachycardia with a heart rate 126 beats per minute-negative acute abnormalities or ischemic findings noted. I-STAT troponin negative elevation. Second i-STAT troponin negative elevation. CBC negative findings. BMP negative findings. Urine pregnancy negative. Chest x-ray negative acute abnormalities noted. D-dimer negative elevation. Doubt PE. Doubt pneumonia. Doubt pneumothorax. Doubt ACS syndrome - HEART score 0. Suspicion to be anxiety and fibromyalgia - patient has history of chronic pain. Pain medications administered in ED setting. Patient responded well to pain medications. Patient's heart rate decreased from 144 beats per minute to 114 beats per minute. These symptoms appear to be chronic-patient is been seen in the emergency department numerous times regarding similar symptoms and presentation-serial troponins negative, echocardiogram unremarkable. Discussed the patient has set up an appointment with cardiology-reported that she has appointment on 06/07/2013. Patient stable, afebrile. Discharged patient. Discharged patient with referral to her primary care provider, cardiology, pain management specialist. Discussed with patient to continue to take medications at home as prescribed. Discussed with patient to avoid any physical or strenuous activity. Discussed with patient to closely monitor symptoms and if symptoms are to worsen or change to report back to the ED - strict return instructions given.  Patient agreed to plan of care, understood, all questions answered.   Raymon Mutton, PA-C 05/10/13 [phone]

## 2013-05-09 NOTE — ED Notes (Signed)
Pt states that she has fibromyalgia and has chronic pain; pt states that for the last 2 hrs she has increased generalized body and chest pain; pt states that she has had this pain in the past but it is worse this time; pt also states that she is short of breath; pt actively crying in triage

## 2013-05-09 NOTE — ED Notes (Signed)
Pt ambulated to the restroom in attempt to obtain a urine sample.

## 2013-05-10 LAB — POCT I-STAT TROPONIN I: Troponin i, poc: 0 ng/mL (ref 0.00–0.08)

## 2013-05-10 LAB — D-DIMER, QUANTITATIVE: D-Dimer, Quant: <0.27 ug/mL-FEU (ref 0.00–0.48)

## 2013-05-10 MED ORDER — SODIUM CHLORIDE 0.9 % IV BOLUS (SEPSIS)
500.0000 mL | Freq: Once | INTRAVENOUS | Status: AC
  Administered 2013-05-10: 500 mL via INTRAVENOUS

## 2013-05-10 MED ORDER — HYDROMORPHONE HCL PF 1 MG/ML IJ SOLN
1.0000 mg | Freq: Once | INTRAMUSCULAR | Status: AC
  Administered 2013-05-10: 1 mg via INTRAVENOUS
  Filled 2013-05-10: qty 1

## 2013-05-10 NOTE — ED Provider Notes (Signed)
Medical screening examination/treatment/procedure(s) were performed by non-physician practitioner and as supervising physician I was immediately available for consultation/collaboration.  EKG Interpretation    Date/Time:  Monday May 09 2013 22:47:04 EST Ventricular Rate:  126 PR Interval:  131 QRS Duration: 72 QT Interval:  296 QTC Calculation: 428 R Axis:   76 Text Interpretation:  Sinus tachycardia Probable left atrial enlargement Borderline T wave abnormalities No significant change since last tracing Confirmed by Karma Ganja  MD, Melannie Metzner [phone]) on 05/10/2013 12:27:42 AM             Ethelda Chick, MD 05/10/13 [phone]

## 2013-05-10 NOTE — Discharge Instructions (Signed)
Please call your doctor for a followup appointment within 24-48 hours. When you talk to your doctor please let them know that you were seen in the emergency department and have them acquire all of your records so that they can discuss the findings with you and formulate a treatment plan to fully care for your new and ongoing problems. Please call and set up appointment with your primary care provider to be reassessed within the next 24-48 hours Please continue to take at home medications as prescribed Please rest and stay hydrated Please keep appointment with cardiology on 06/07/2013 Please continue to monitor symptoms closely and if symptoms are to worsen or change (fever greater than 101, chills, chest pain, shortness of breath, difficulty breathing, numbness, tingling, worsening pain, stomach pain, nausea, vomiting, fall, injury) please report back to emergency department immediately   Chest Pain (Nonspecific) It is often hard to give a specific diagnosis for the cause of chest pain. There is always a chance that your pain could be related to something serious, such as a heart attack or a blood clot in the lungs. You need to follow up with your caregiver for further evaluation. CAUSES   Heartburn.  Pneumonia or bronchitis.  Anxiety or stress.  Inflammation around your heart (pericarditis) or lung (pleuritis or pleurisy).  A blood clot in the lung.  A collapsed lung (pneumothorax). It can develop suddenly on its own (spontaneous pneumothorax) or from injury (trauma) to the chest.  Shingles infection (herpes zoster virus). The chest wall is composed of bones, muscles, and cartilage. Any of these can be the source of the pain.  The bones can be bruised by injury.  The muscles or cartilage can be strained by coughing or overwork.  The cartilage can be affected by inflammation and become sore (costochondritis). DIAGNOSIS  Lab tests or other studies, such as X-rays, electrocardiography,  stress testing, or cardiac imaging, may be needed to find the cause of your pain.  TREATMENT   Treatment depends on what may be causing your chest pain. Treatment may include:  Acid blockers for heartburn.  Anti-inflammatory medicine.  Pain medicine for inflammatory conditions.  Antibiotics if an infection is present.  You may be advised to change lifestyle habits. This includes stopping smoking and avoiding alcohol, caffeine, and chocolate.  You may be advised to keep your head raised (elevated) when sleeping. This reduces the chance of acid going backward from your stomach into your esophagus.  Most of the time, nonspecific chest pain will improve within 2 to 3 days with rest and mild pain medicine. HOME CARE INSTRUCTIONS   If antibiotics were prescribed, take your antibiotics as directed. Finish them even if you start to feel better.  For the next few days, avoid physical activities that bring on chest pain. Continue physical activities as directed.  Do not smoke.  Avoid drinking alcohol.  Only take over-the-counter or prescription medicine for pain, discomfort, or fever as directed by your caregiver.  Follow your caregiver's suggestions for further testing if your chest pain does not go away.  Keep any follow-up appointments you made. If you do not go to an appointment, you could develop lasting (chronic) problems with pain. If there is any problem keeping an appointment, you must call to reschedule. SEEK MEDICAL CARE IF:   You think you are having problems from the medicine you are taking. Read your medicine instructions carefully.  Your chest pain does not go away, even after treatment.  You develop a rash with  blisters on your chest. SEEK IMMEDIATE MEDICAL CARE IF:   You have increased chest pain or pain that spreads to your arm, neck, jaw, back, or abdomen.  You develop shortness of breath, an increasing cough, or you are coughing up blood.  You have severe back  or abdominal pain, feel nauseous, or vomit.  You develop severe weakness, fainting, or chills.  You have a fever. THIS IS AN EMERGENCY. Do not wait to see if the pain will go away. Get medical help at once. Call your local emergency services (911 in U.[place] Do not drive yourself to the hospital. MAKE SURE YOU:   Understand these instructions.  Will watch your condition.  Will get help right away if you are not doing well or get worse. Document Released: 12/25/2004 Document Revised: 06/09/2011 Document Reviewed: 10/21/2007 [facility] Patient Information 2014 [place], [place].   Emergency Department Resource Guide 1) Find a Doctor and Pay Out of Pocket Although you won't have to find out who is covered by your insurance plan, it is a good idea to ask around and get recommendations. You will then need to call the office and see if the doctor you have chosen will accept you as a new patient and what types of options they offer for patients who are self-pay. Some doctors offer discounts or will set up payment plans for their patients who do not have insurance, but you will need to ask so you aren't surprised when you get to your appointment.  2) Contact Your Local Health Department Not all health departments have doctors that can see patients for sick visits, but many do, so it is worth a call to see if yours does. If you don't know where your local health department is, you can check in your phone book. The CDC also has a tool to help you locate your state's health department, and many state websites also have listings of all of their local health departments.  3) Find a Walk-in Clinic If your illness is not likely to be very severe or complicated, you may want to try a walk in clinic. These are popping up all over the country in pharmacies, drugstores, and shopping centers. They're usually staffed by nurse practitioners or physician assistants that have been trained to treat common illnesses and  complaints. They're usually fairly quick and inexpensive. However, if you have serious medical issues or chronic medical problems, these are probably not your best option.  No Primary Care Doctor: - Call Health Connect at  [phone] - they can help you locate a primary care doctor that  accepts your insurance, provides certain services, etc. - Physician Referral Service- [phone]  Chronic Pain Problems: Organization         Address  Phone   Notes  [facility]  [phone] Patients need to be referred by their primary care doctor.   Medication Assistance: Organization         Address  Phone   Notes  [facility] Medication [facility] [address] [place] [place], [place] [address] [phone] --Must be a resident of [facility] -- Must have NO insurance coverage whatsoever (no Medicaid/ Medicare, etc.) -- The pt. MUST have a primary care doctor that directs their care regularly and follows them in the community   Med[facility]ist  [phone]   [employer]  [phone]    Agencies that provide inexpensive medical care: Organization         Address  Phone   Notes  [facility]  [phone]   [facility]    [phone]   [facility] [place], [place] [address] [phone]   [place]. [address], [place] [phone]   Planned Parenthood    [phone]   [facility]    [phone]   [facility] and [place] [address], [place] Phone:  [phone], Fax:  [phone] Hours of Operation:  9 am - 6 pm, M-F.  Also accepts Medicaid/Medicare and self-pay.  [facility] for [place], [address], [place] Phone: [phone], Fax: [phone]. Hours of Operation:  8:30 am - 5:30 pm, M-F.  Also accepts Medicaid and self-pay.  [facility] [address] [place] Phone: [phone]   [place], [place], [place] [phone], Ext. [phone] Mondays & Thursdays: 7-9 AM.  First 15 patients are seen on a first come, first serve basis.    [place] Providers:  Organization         Address  Phone   Notes  [facility] [address], [place] [phone] Also accepts self-pay patients.  [facility] [address], [place]  [phone]   [place], [address], [place] [phone]   [facility] [address] [place] [phone]   Lucianne Lei [address], [place]   [phone] Only accepts [place] Access [place] patients after they have their name applied to their card.   Self-Pay (no insurance) in [facility]:  Organization         Address  Phone   Notes  Sickle Cell Patients, [facility] [place] [phone]   [facility] [place] [phone]   [facility] [place]  [place], [place], [place] [phone]   [facility]/Dr. Osei-Bonsu  [address] [place] or [address], [place] [phone] Phone number for both [place] and [place] locations is the same.  [facility] [address] [place] [phone]   [facility] [address] [place] or [address] [phone]   [facility] [address], [place] [phone], phone; [phone], fax Sees patients 1st and 3rd Saturday of every month.  Must not qualify for public or private insurance (i.e. Medicaid, Medicare, [place] Health Choice, Veterans' Benefits)  Household income should be no more than 200% of the poverty level  The clinic cannot treat you if you are pregnant or think you are pregnant  Sexually transmitted diseases are not treated at the clinic.    Dental Care: Organization         Address  Phone  Notes  [facility] Department of [facility] [place] [phone] Accepts children up to age 38 who are enrolled in [place] or [place]; pregnant women with a Medicaid card; and children who have applied for Medicaid or [place] Health Choice, but were declined, whose parents can pay a reduced fee at time of service.  [facility] Department of [facility]  [address], [place] [phone] Accepts children up to age 26 who are enrolled in [place] or [place]; pregnant women with a Medicaid card; and children who have applied for Medicaid or [place] Health Choice, but were declined, whose parents can pay a reduced fee at time of service.  [place] Adult Dental Access PROGRAM  [place] [phone] Patients are seen by appointment only. Walk-ins are not accepted. [place] will see patients 77 years of age and older. Monday - Tuesday (8am-5pm) Most Wednesdays (8:30-5pm) $30 per visit, cash only  [facility] Adult Dental Access PROGRAM  [address], [facility] [phone] Patients are seen by appointment only. Walk-ins are not accepted. [place] will see patients 33 years of age and older. One Wednesday Evening (Monthly: Volunteer Based).  $30 per visit, cash only  [place]  [phone] for adults; Children under age 41, call Graduate Pediatric Dentistry at [phone]. Children aged 33-14, please call [phone] to request a pediatric application.  Dental services are provided in all areas of dental care including fillings, crowns and bridges, complete and partial dentures, implants, gum treatment, root canals, and extractions. Preventive care is  also provided. Treatment is provided to both adults and children. Patients are selected via a lottery and there is often a waiting list.   [facility] [address] [place]  [phone] [email or web address]   [facility] [address] [place], [place] [phone], Ext. [phone] Second and Fourth Thursday of each month, opens at 6:30 AM; Clinic ends at 9 AM.  Patients are seen on a first-come first-served basis, and a limited number are seen during each clinic.   [facility]  [address] Hillard Danker [place], [place] [phone]   Eligibility Requirements You must have lived in [place], [place], or [place] counties for at least the last three months.   You cannot be eligible for state or federal sponsored [employer], including [employer], [place], or [employer].   You generally cannot be eligible for healthcare insurance through your employer.    How to apply: Eligibility screenings are held every Tuesday and Wednesday afternoon from 1:00 pm until 4:00 pm. You do not need an appointment for the interview!  [facility] [address] [place], [place]   [place]  [place] Department  [place]  [phone]    Behavioral Health Resources in the Community: Intensive Outpatient Programs Organization         Address  Phone  Notes  [place]. [address] [place], [place] [phone]   [facility] [address] [place], [place]   ADS: [facility] Drug [facility] [address] [place], [place]   [place] [address]  [place], [place] or [phone]   Substance Abuse Resources Organization         Address  Phone  Notes  Alcohol and Drug Services  [phone]   [place]  [phone]   [facility]   Chinita Pester  [phone]   Residential & Outpatient Substance Abuse Program  [phone]   Psychological Services Organization         Address  Phone  Notes  [place]   [place] [address] [place] or [phone]    Mobile Crisis Teams Organization         Address  Phone  Notes  [facility], Mobile Crisis Care Unit  [phone]   [facility]  [address] [place], [place]   [place] Levels [address], [place] [phone]    Self-Help/Support Groups Organization         Address  Phone             Notes  [place]. of [place] - variety of support groups  [place] Call for more information  Narcotics Anonymous (NA), [facility] [address], [employer] [place]  2 meetings at this location   Special educational needs teacher         Address  Phone  Notes  ASAP Residential Treatment [place],    [place]  [phone]   [facility]  [address] [place] [id number], [place], [place]   [place], [place] [phone] Admissions: 8am-3pm M-F  Incentives Substance [place] [address]    [place], [place] [phone]   [facility] [address] [place], [place], [place]   [facility] [address]  [place], [place]   [facility]ight Programs - Intensive Outpatient [address] Kristeen [address], [place], [place]   [facility] ([place].,  [place], [place] [phone] or [phone]   [facility] Treatment Services (RTS) [address], [place], [place] Accepts Medicaid  [facility] [place] [address],  [place] [phone]  Substance Abuse/Addiction Treatment   [facility] Organization         Address  Phone  Notes  [facility]  [phone]   Domenic Schwab, PhD [address] [place], [place]   [phone] or [phone]   [address] [place], [place] [phone]   [facility] [address], [place], [place] [phone] Insurance/Medicaid/sponsorship through [facility] and Families [address]                                    [place], [place] [phone] [place] [address], [place] [phone]    Dr. Adele Schilder  [phone]   [facility] Dept. 1) [address] [place] 2) [place] 3)  [place] [address], [place] [phone]  [phone]   [place] [phone] or [phone] (After Hours)

## 2013-05-12 ENCOUNTER — Ambulatory Visit: Payer: 59 | Admitting: Diagnostic Neuroimaging

## 2013-05-17 ENCOUNTER — Encounter (HOSPITAL_COMMUNITY): Payer: Self-pay | Admitting: Emergency Medicine

## 2013-05-17 ENCOUNTER — Emergency Department (HOSPITAL_COMMUNITY)
Admission: EM | Admit: 2013-05-17 | Discharge: 2013-05-17 | Disposition: A | Discharge status: Home / Self Care | Payer: 59 | Attending: Emergency Medicine | Admitting: Emergency Medicine

## 2013-05-17 DIAGNOSIS — Z794 Long term (current) use of insulin: Secondary | ICD-10-CM | POA: Insufficient documentation

## 2013-05-17 DIAGNOSIS — R079 Chest pain, unspecified: Secondary | ICD-10-CM | POA: Insufficient documentation

## 2013-05-17 DIAGNOSIS — E1065 Type 1 diabetes mellitus with hyperglycemia: Secondary | ICD-10-CM | POA: Insufficient documentation

## 2013-05-17 DIAGNOSIS — Z79899 Other long term (current) drug therapy: Secondary | ICD-10-CM | POA: Insufficient documentation

## 2013-05-17 DIAGNOSIS — R52 Pain, unspecified: Secondary | ICD-10-CM | POA: Insufficient documentation

## 2013-05-17 DIAGNOSIS — I10 Essential (primary) hypertension: Secondary | ICD-10-CM | POA: Insufficient documentation

## 2013-05-17 DIAGNOSIS — IMO0002 Reserved for concepts with insufficient information to code with codable children: Secondary | ICD-10-CM | POA: Insufficient documentation

## 2013-05-17 DIAGNOSIS — Z8719 Personal history of other diseases of the digestive system: Secondary | ICD-10-CM | POA: Insufficient documentation

## 2013-05-17 LAB — GLUCOSE, CAPILLARY: Glucose-Capillary: 216 mg/dL — ABNORMAL HIGH (ref 70–99)

## 2013-05-17 MED ORDER — HYDROMORPHONE HCL PF 1 MG/ML IJ SOLN
1.0000 mg | Freq: Once | INTRAMUSCULAR | Status: AC
  Administered 2013-05-17: 1 mg via INTRAVENOUS
  Filled 2013-05-17: qty 1

## 2013-05-17 MED ORDER — SODIUM CHLORIDE 0.9 % IV BOLUS (SEPSIS)
1000.0000 mL | Freq: Once | INTRAVENOUS | Status: AC
  Administered 2013-05-17: 1000 mL via INTRAVENOUS

## 2013-05-17 NOTE — ED Provider Notes (Addendum)
CSN: [id number]     Arrival date & time 05/17/13  1543 History   First MD Initiated Contact with Patient 05/17/13 1607     Chief Complaint  Patient presents with  . Chest Pain  . Generalized Body Aches     (Consider location/radiation/quality/duration/timing/severity/associated sxs/prior Treatment) HPI Comments: Patient presents with generalized body aches and chest pain. She has a history of ongoing generalized body and recurrent centralized chest pain with persistent tachycardia. She's been seen multiple times in the ED for this similar type chest pain. She describes it as an achy pain across her chest. It's worse when she stands up. She denies any shortness of breath. She denies any cough or chest congestion. She denies any pleuritic-type pain. She denies any leg pain or swelling. She states she is hurting all over in her muscles and joints. She has a history of recurrent body aches and is currently followed by a neurologist as well as pain management. At this point he feels like it might be fibromyalgia. She's been tested for lupus and rheumatoid arthritis which has been negative. She's been evaluated multiple times for chest pain. She's had multiple negative troponins. She was recently had a hospitalization for body aches and had a 2-D echo which was unremarkable. She has an upcoming cardiology appointment on March 10.  Patient is a 23 y.o. female presenting with chest pain.  Chest Pain Associated symptoms: no abdominal pain, no back pain, no cough, no diaphoresis, no dizziness, no fatigue, no fever, no headache, no nausea, no numbness, no shortness of breath, not vomiting and no weakness     Past Medical History  Diagnosis Date  . Diabetes mellitus   . Hypertension   . Diabetes type 1, uncontrolled 11/14/2011    Since age 74  . Gastroparesis    Past Surgical History  Procedure Laterality Date  . Ankle surgery    . Cholecystectomy  11/15/2011    Procedure: LAPAROSCOPIC  CHOLECYSTECTOMY WITH INTRAOPERATIVE CHOLANGIOGRAM;  Surgeon: [name], MD;  Location: [facility];  Service: General;  Laterality: N/A;  . Laparoscopy  11/23/2011    Procedure: LAPAROSCOPY DIAGNOSTIC;  Surgeon: Mariella Saa, MD;  Location: [facility];  Service: General;  Laterality: N/A;  . Esophagogastroduodenoscopy  12/03/2011    Procedure: ESOPHAGOGASTRODUODENOSCOPY (EGD);  Surgeon: Theda Belfast, MD;  Location: [facility];  Service: Endoscopy;  Laterality: N/A;   Family History  Problem Relation Age of Onset  . Diabetes Mother   . Hypertension Father    History  Substance Use Topics  . Smoking status: Never Smoker   . Smokeless tobacco: Never Used  . Alcohol Use: Yes     Comment: Occasional   OB History   Grav Para Term Preterm Abortions TAB SAB Ect Mult Living                 Review of Systems  Constitutional: Negative for fever, chills, diaphoresis and fatigue.  HENT: Negative for congestion, rhinorrhea and sneezing.   Eyes: Negative.   Respiratory: Negative for cough, chest tightness and shortness of breath.   Cardiovascular: Positive for chest pain. Negative for leg swelling.  Gastrointestinal: Negative for nausea, vomiting, abdominal pain, diarrhea and blood in stool.  Genitourinary: Negative for frequency, hematuria, flank pain and difficulty urinating.  Musculoskeletal: Negative for arthralgias and back pain.  Skin: Negative for rash.  Neurological: Negative for dizziness, speech difficulty, weakness, numbness and headaches.      Allergies  Peanut-containing drug products  Home Medications  Current Outpatient Rx  Name  Route  Sig  Dispense  Refill  . citalopram (CELEXA) 20 MG tablet   Oral   Take 20 mg by mouth at bedtime.         . cyclobenzaprine (FLEXERIL) 10 MG tablet   Oral   Take 1 tablet (10 mg total) by mouth 3 (three) times daily as needed for muscle spasms (muscle pain).   30 tablet   0   . dicyclomine (BENTYL) 10 MG capsule    Oral   Take 10 mg by mouth 4 (four) times daily -  before meals and at bedtime.         . DULoxetine (CYMBALTA) 30 MG capsule   Oral   Take 30 mg by mouth at bedtime.         . enalapril (VASOTEC) 10 MG tablet   Oral   Take 10 mg by mouth every evening.          . gabapentin (NEURONTIN) 400 MG capsule   Oral   Take 2 capsules (800 mg total) by mouth 4 (four) times daily.   240 capsule   5   . ibuprofen (ADVIL,MOTRIN) 200 MG tablet   Oral   Take 800 mg by mouth every 6 (six) hours as needed for moderate pain.         Marland Kitchen insulin glargine (LANTUS) 100 UNIT/ML injection   Subcutaneous   Inject 40 Units into the skin at bedtime.         . insulin lispro (HUMALOG) 100 UNIT/ML injection   Subcutaneous   Inject 5-20 Units into the skin 4 (four) times daily -  before meals and at bedtime. Inject 5 units (base) and additional units according to sliding scale- sliding scale: CBG 70 - 120: 0 uunits: CBG 121 - 150: 2 units; CBG 151 - 200: 3 units; CBG 201 - 250: 5 uunits; CBG 251 - 300: 8 units;CBG 301 - 350: 11 units; CBG 351 - 400: 15 units; CBG > 400 Call MD         . metoCLOPramide (REGLAN) 10 MG tablet   Oral   Take 1 tablet (10 mg total) by mouth 4 (four) times daily.   120 tablet   0   . metoprolol tartrate (LOPRESSOR) 25 MG tablet   Oral   Take 0.5 tablets (12.5 mg total) by mouth 2 (two) times daily.   30 tablet   1   . oxyCODONE-acetaminophen (PERCOCET/ROXICET) 5-325 MG per tablet   Oral   Take 1-2 tablets by mouth every 6 (six) hours as needed for severe pain.   30 tablet   0   . promethazine (PHENERGAN) 25 MG tablet   Oral   Take 25 mg by mouth every 6 (six) hours as needed for nausea or vomiting. For nausea         . tetrahydrozoline (VISINE) 0.05 % ophthalmic solution   Both Eyes   Place 2 drops into both eyes as needed (dry eyes).          BP 119/83  Pulse 152  Temp(Src) 98.4 F (36.9 C) (Oral)  Resp 17  SpO2 99% Physical Exam   Constitutional: She is oriented to person, place, and time. She appears well-developed and well-nourished.  HENT:  Head: Normocephalic and atraumatic.  Eyes: Pupils are equal, round, and reactive to light.  Neck: Normal range of motion. Neck supple.  Cardiovascular: Normal rate, regular rhythm and normal heart sounds.   Pulmonary/Chest: Effort  normal and breath sounds normal. No respiratory distress. She has no wheezes. She has no rales. She exhibits tenderness.  Abdominal: Soft. Bowel sounds are normal. There is no tenderness. There is no rebound and no guarding.  Musculoskeletal: Normal range of motion. She exhibits no edema.  Lymphadenopathy:    She has no cervical adenopathy.  Neurological: She is alert and oriented to person, place, and time.  Skin: Skin is warm and dry. No rash noted.  Psychiatric: She has a normal mood and affect.    ED Course  Procedures (including critical care time) Labs Review Labs Reviewed  GLUCOSE, CAPILLARY - Abnormal; Notable for the following:    Glucose-Capillary 216 (*)    All other components within normal limits   Imaging Review No results found.  EKG Interpretation    Date/Time:  Tuesday May 17 2013 16:09:57 EST Ventricular Rate:  140 PR Interval:  90 QRS Duration: 72 QT Interval:  404 QTC Calculation: 617 R Axis:   71 Text Interpretation:  Sinus tachycardia Prolonged QT interval since last tracing no significant change Confirmed by Carmellia Kreisler  MD, Hades Mathew ([id number]) on 05/17/2013 4:16:21 PM            MDM   Final diagnoses:  Body aches  Chest pain    Patient is feeling much better after pain medications and IV fluids. Her EKG did not show any ischemic changes. She does have tachycardia which is noted on her prior visits. She was given IV fluids and her heart rate is coming down to around 110. Her chest pain is reproducible and associated with generalized body pain. She doesn't have any other symptoms that would be more  suggestive of acute coronary syndrome or pulmonary embolus. She's been evaluated multiple times for this same type of chest pain. This is her fourth visit in February for similar pain. She's had multiple negative troponins, chest x-rays and other blood work. At this point I did not feel that repeat blood work today would be beneficial. She has an upcoming appointment with a cardiologist although her chest pain does not really sound cardiac in nature. She's also been followed by pain management and her neurologist for her ongoing pain. I encouraged her to followup with her outpatient physicians.    Rolan BuccoMelanie Ori Trejos, MD 05/17/13 Alisia [id number]  Kairy Folsom, MD 05/30/13 2130

## 2013-05-17 NOTE — Discharge Instructions (Signed)
Chest Pain (Nonspecific) °It is often hard to give a specific diagnosis for the cause of chest pain. There is always a chance that your pain could be related to something serious, such as a heart attack or a blood clot in the lungs. You need to follow up with your caregiver for further evaluation. °CAUSES  °· Heartburn. °· Pneumonia or bronchitis. °· Anxiety or stress. °· Inflammation around your heart (pericarditis) or lung (pleuritis or pleurisy). °· A blood clot in the lung. °· A collapsed lung (pneumothorax). It can develop suddenly on its own (spontaneous pneumothorax) or from injury (trauma) to the chest. °· Shingles infection (herpes zoster virus). °The chest wall is composed of bones, muscles, and cartilage. Any of these can be the source of the pain. °· The bones can be bruised by injury. °· The muscles or cartilage can be strained by coughing or overwork. °· The cartilage can be affected by inflammation and become sore (costochondritis). °DIAGNOSIS  °Lab tests or other studies, such as X-rays, electrocardiography, stress testing, or cardiac imaging, may be needed to find the cause of your pain.  °TREATMENT  °· Treatment depends on what may be causing your chest pain. Treatment may include: °· Acid blockers for heartburn. °· Anti-inflammatory medicine. °· Pain medicine for inflammatory conditions. °· Antibiotics if an infection is present. °· You may be advised to change lifestyle habits. This includes stopping smoking and avoiding alcohol, caffeine, and chocolate. °· You may be advised to keep your head raised (elevated) when sleeping. This reduces the chance of acid going backward from your stomach into your esophagus. °· Most of the time, nonspecific chest pain will improve within 2 to 3 days with rest and mild pain medicine. °HOME CARE INSTRUCTIONS  °· If antibiotics were prescribed, take your antibiotics as directed. Finish them even if you start to feel better. °· For the next few days, avoid physical  activities that bring on chest pain. Continue physical activities as directed. °· Do not smoke. °· Avoid drinking alcohol. °· Only take over-the-counter or prescription medicine for pain, discomfort, or fever as directed by your caregiver. °· Follow your caregiver's suggestions for further testing if your chest pain does not go away. °· Keep any follow-up appointments you made. If you do not go to an appointment, you could develop lasting (chronic) problems with pain. If there is any problem keeping an appointment, you must call to reschedule. °SEEK MEDICAL CARE IF:  °· You think you are having problems from the medicine you are taking. Read your medicine instructions carefully. °· Your chest pain does not go away, even after treatment. °· You develop a rash with blisters on your chest. °SEEK IMMEDIATE MEDICAL CARE IF:  °· You have increased chest pain or pain that spreads to your arm, neck, jaw, back, or abdomen. °· You develop shortness of breath, an increasing cough, or you are coughing up blood. °· You have severe back or abdominal pain, feel nauseous, or vomit. °· You develop severe weakness, fainting, or chills. °· You have a fever. °THIS IS AN EMERGENCY. Do not wait to see if the pain will go away. Get medical help at once. Call your local emergency services (911 in [place] Do not drive yourself to the hospital. °MAKE SURE YOU:  °· Understand these instructions. °· Will watch your condition. °· Will get help right away if you are not doing well or get worse. °Document Released: 12/25/2004 Document Revised: 06/09/2011 Document Reviewed: 10/21/2007 °[employer]® Patient Information ©2014 [employer]   [employer]. ° °

## 2013-05-17 NOTE — ED Notes (Signed)
Pt c/o recurrent central chest pain and generalized body aches starting this morning.  Pain score 10/10.  Pt reports "I get lightheaded, when I stand up."

## 2013-05-18 ENCOUNTER — Emergency Department (HOSPITAL_COMMUNITY)
Admission: EM | Admit: 2013-05-18 | Discharge: 2013-05-18 | Disposition: A | Discharge status: Home / Self Care | Payer: 59 | Attending: Emergency Medicine | Admitting: Emergency Medicine

## 2013-05-18 ENCOUNTER — Encounter (HOSPITAL_COMMUNITY): Payer: Self-pay | Admitting: Emergency Medicine

## 2013-05-18 DIAGNOSIS — Z794 Long term (current) use of insulin: Secondary | ICD-10-CM | POA: Insufficient documentation

## 2013-05-18 DIAGNOSIS — IMO0002 Reserved for concepts with insufficient information to code with codable children: Secondary | ICD-10-CM | POA: Insufficient documentation

## 2013-05-18 DIAGNOSIS — R079 Chest pain, unspecified: Secondary | ICD-10-CM

## 2013-05-18 DIAGNOSIS — R42 Dizziness and giddiness: Secondary | ICD-10-CM | POA: Insufficient documentation

## 2013-05-18 DIAGNOSIS — Z79899 Other long term (current) drug therapy: Secondary | ICD-10-CM | POA: Insufficient documentation

## 2013-05-18 DIAGNOSIS — E1065 Type 1 diabetes mellitus with hyperglycemia: Secondary | ICD-10-CM | POA: Insufficient documentation

## 2013-05-18 DIAGNOSIS — R000 Tachycardia, unspecified: Secondary | ICD-10-CM

## 2013-05-18 DIAGNOSIS — Z8719 Personal history of other diseases of the digestive system: Secondary | ICD-10-CM | POA: Insufficient documentation

## 2013-05-18 DIAGNOSIS — I10 Essential (primary) hypertension: Secondary | ICD-10-CM | POA: Insufficient documentation

## 2013-05-18 MED ORDER — HYDROMORPHONE HCL PF 1 MG/ML IJ SOLN
1.0000 mg | Freq: Once | INTRAMUSCULAR | Status: AC
  Administered 2013-05-18: 1 mg via INTRAVENOUS
  Filled 2013-05-18: qty 1

## 2013-05-18 MED ORDER — KETOROLAC TROMETHAMINE 30 MG/ML IJ SOLN
30.0000 mg | Freq: Once | INTRAMUSCULAR | Status: AC
  Administered 2013-05-18: 30 mg via INTRAVENOUS
  Filled 2013-05-18: qty 2

## 2013-05-18 MED ORDER — OXYCODONE-ACETAMINOPHEN 5-325 MG PO TABS: 1.0000 | ORAL_TABLET | Freq: Four times a day (QID) | ORAL | Status: DC | PRN

## 2013-05-18 MED ORDER — SODIUM CHLORIDE 0.9 % IV BOLUS (SEPSIS)
1000.0000 mL | Freq: Once | INTRAVENOUS | Status: AC
  Administered 2013-05-18: 1000 mL via INTRAVENOUS

## 2013-05-18 NOTE — ED Provider Notes (Signed)
CSN: [id number]     Arrival date & time 05/18/13  7829 History   First MD Initiated Contact with Patient 05/18/13 [phone]     No chief complaint on file.    (Consider location/radiation/quality/duration/timing/severity/associated sxs/prior Treatment) HPI  Patient presents for the second time in 2 days with pain diffusely and focal chest pain. She has a long history of chronic pain, and is currently being evaluated by multiple physicians for this. She notes that since yesterday she has had increasing pain in the anterior thorax.  Pain is diffuse, sore, severe.  No attempts at relief with medication thus far.  There is mild associated lightheadedness, no dyspnea. No nausea, no vomiting, no syncope, no fever, no chills. She denies lower extremity edema, smoking, travel, new medication or diet.   Past Medical History  Diagnosis Date  . Diabetes mellitus   . Hypertension   . Diabetes type 1, uncontrolled 11/14/2011    Since age 81  . Gastroparesis    Past Surgical History  Procedure Laterality Date  . Ankle surgery    . Cholecystectomy  11/15/2011    Procedure: LAPAROSCOPIC CHOLECYSTECTOMY WITH INTRAOPERATIVE CHOLANGIOGRAM;  Surgeon: [name], MD;  Location: [facility];  Service: General;  Laterality: N/A;  . Laparoscopy  11/23/2011    Procedure: LAPAROSCOPY DIAGNOSTIC;  Surgeon: Edward Jolly, MD;  Location: [facility];  Service: General;  Laterality: N/A;  . Esophagogastroduodenoscopy  12/03/2011    Procedure: ESOPHAGOGASTRODUODENOSCOPY (EGD);  Surgeon: Beryle Beams, MD;  Location: [facility];  Service: Endoscopy;  Laterality: N/A;   Family History  Problem Relation Age of Onset  . Diabetes Mother   . Hypertension Father    History  Substance Use Topics  . Smoking status: Never Smoker   . Smokeless tobacco: Never Used  . Alcohol Use: Yes     Comment: Occasional   OB History   Grav Para Term Preterm Abortions TAB SAB Ect Mult Living                 Review of Systems   Constitutional:       Per HPI, otherwise negative  HENT:       Per HPI, otherwise negative  Respiratory:       Per HPI, otherwise negative  Cardiovascular:       Per HPI, otherwise negative  Gastrointestinal: Negative for vomiting.  Endocrine:       Negative aside from HPI  Genitourinary:       Neg aside from HPI   Musculoskeletal:       Per HPI, otherwise negative  Skin: Negative.   Neurological: Negative for syncope.      Allergies  Peanut-containing drug products  Home Medications   Current Outpatient Rx  Name  Route  Sig  Dispense  Refill  . citalopram (CELEXA) 20 MG tablet   Oral   Take 20 mg by mouth at bedtime.         . cyclobenzaprine (FLEXERIL) 10 MG tablet   Oral   Take 1 tablet (10 mg total) by mouth 3 (three) times daily as needed for muscle spasms (muscle pain).   30 tablet   0   . dicyclomine (BENTYL) 10 MG capsule   Oral   Take 10 mg by mouth 4 (four) times daily -  before meals and at bedtime.         . DULoxetine (CYMBALTA) 30 MG capsule   Oral   Take 30 mg by mouth at bedtime.         Marland Kitchen  enalapril (VASOTEC) 10 MG tablet   Oral   Take 10 mg by mouth every evening.          . gabapentin (NEURONTIN) 400 MG capsule   Oral   Take 2 capsules (800 mg total) by mouth 4 (four) times daily.   240 capsule   5   . insulin glargine (LANTUS) 100 UNIT/ML injection   Subcutaneous   Inject 40 Units into the skin at bedtime.         . insulin lispro (HUMALOG) 100 UNIT/ML injection   Subcutaneous   Inject 5-20 Units into the skin 4 (four) times daily -  before meals and at bedtime. Inject 5 units (base) and additional units according to sliding scale- sliding scale: CBG 70 - 120: 0 uunits: CBG 121 - 150: 2 units; CBG 151 - 200: 3 units; CBG 201 - 250: 5 uunits; CBG 251 - 300: 8 units;CBG 301 - 350: 11 units; CBG 351 - 400: 15 units; CBG > 400 Call MD         . metoCLOPramide (REGLAN) 10 MG tablet   Oral   Take 1 tablet (10 mg total) by  mouth 4 (four) times daily.   120 tablet   0   . metoprolol tartrate (LOPRESSOR) 25 MG tablet   Oral   Take 0.5 tablets (12.5 mg total) by mouth 2 (two) times daily.   30 tablet   1   . oxyCODONE-acetaminophen (PERCOCET/ROXICET) 5-325 MG per tablet   Oral   Take 1-2 tablets by mouth every 6 (six) hours as needed for severe pain.   30 tablet   0   . promethazine (PHENERGAN) 25 MG tablet   Oral   Take 25 mg by mouth every 6 (six) hours as needed for nausea or vomiting. For nausea          BP 105/77  Pulse 132  Temp(Src) 98.3 F (36.8 C)  Resp 14  SpO2 99% Physical Exam  Nursing note and vitals reviewed. Constitutional: She is oriented to person, place, and time. She appears ill.  HENT:  Head: Normocephalic and atraumatic.  Eyes: Conjunctivae and EOM are normal.  Cardiovascular: Regular rhythm.  Tachycardia present.   Pulmonary/Chest: Effort normal and breath sounds normal. No stridor. No respiratory distress.  Abdominal: She exhibits no distension.  Musculoskeletal: She exhibits no edema.  Neurological: She is alert and oriented to person, place, and time. No cranial nerve deficit. Coordination normal.  Diffuse atrophy  Skin: Skin is warm and dry. No rash noted.  Psychiatric: She has a normal mood and affect.    ED Course  Procedures (including critical care time) Labs Review Labs Reviewed - No data to display Imaging Review No results found.  EKG Interpretation    Date/Time:  Wednesday May 18 2013 09:31:20 EST Ventricular Rate:  134 PR Interval:  110 QRS Duration: 74 QT Interval:  421 QTC Calculation: 629 R Axis:   81 Text Interpretation:  Sinus tachycardia Consider right atrial enlargement Prolonged QT interval No significant change since last tracing Confirmed by GOLDSTON  MD, SCOTT ([id number]) on 05/18/2013 9:36:30 AM           After the initial evaluation I reviewed the notes, including yesterday's evaluation, and imaging studies, including  multiple CT studies over the past year.  Update: I exam the patient appears comfortable.  Heart rate has diminished substantially to 105/110 - which is typical for her according to her chart.  Pain has improved considerably as  well.  MDM   Final diagnoses:  Chest pain  Tachycardia    This young female, with multiple evaluations here recently, now presents with ongoing chest pain.  Notably, the patient is tachycardic, was hemodynamically stable, in no distress on my initial exam.  Patient's presentation is consistent with multiple prior encounters.  Patient's pain improved here, tachycardia present here, and there is low suspicion for occult assistant pathologic any previously thorough evaluations both here and with her primary care team.  The patient has a primary care physician, as well as a cardiologist with whom she will follow up soon.    Carmin Muskrat, MD 05/18/13 [phone]

## 2013-05-18 NOTE — Discharge Instructions (Signed)
As discussed, it is important that you follow up as soon as possible with your physician for continued management of your condition.  If you develop any new, or concerning changes in your condition, please return to the emergency department immediately.   Chest Pain (Nonspecific) Chest pain has many causes. Your pain could be caused by something serious, such as a heart attack or a blood clot in the lungs. It could also be caused by something less serious, such as a chest bruise or a virus. Follow up with your doctor. More lab tests or other studies may be needed to find the cause of your pain. Most of the time, nonspecific chest pain will improve within 2 to 3 days of rest and mild pain medicine. HOME CARE  For chest bruises, you may put ice on the sore area for 15-20 minutes, 03-04 times a day. Do this only if it makes you feel better.  Put ice in a plastic bag.  Place a towel between the skin and the bag.  Rest for the next 2 to 3 days.  Go back to work if the pain improves.  See your doctor if the pain lasts longer than 1 to 2 weeks.  Only take medicine as told by your doctor.  Quit smoking if you smoke. GET HELP RIGHT AWAY IF:   There is more pain or pain that spreads to the arm, neck, jaw, back, or belly (abdomen).  You have shortness of breath.  You cough more than usual or cough up blood.  You have very bad back or belly pain, feel sick to your stomach (nauseous), or throw up (vomit).  You have very bad weakness.  You pass out (faint).  You have a fever. Any of these problems may be serious and may be an emergency. Do not wait to see if the problems will go away. Get medical help right away. Call your local emergency services 911 in [place] Do not drive yourself to the hospital. MAKE SURE YOU:   Understand these instructions.  Will watch this condition.  Will get help right away if you or your child is not doing well or gets worse. Document Released: 09/03/2007  Document Revised: 06/09/2011 Document Reviewed: 09/03/2007 [facility] Patient Information 2014 [place], [place].

## 2013-05-18 NOTE — ED Notes (Signed)
Initial Contact - pt resting on stretcher with eyes closed, pt reports pain improved from previously.  Denies needs/complaints at this time.  NAD.

## 2013-05-18 NOTE — ED Notes (Signed)
C/o chest pain and body aches, states she has this type of chest pain when she has the body aches, denies fever, c/o nausea and diarrhea

## 2013-05-21 ENCOUNTER — Emergency Department (HOSPITAL_COMMUNITY)
Admission: EM | Admit: 2013-05-21 | Discharge: 2013-05-21 | Disposition: A | Discharge status: Home / Self Care | Payer: 59 | Attending: Emergency Medicine | Admitting: Emergency Medicine

## 2013-05-21 ENCOUNTER — Encounter (HOSPITAL_COMMUNITY): Payer: Self-pay | Admitting: Emergency Medicine

## 2013-05-21 DIAGNOSIS — IMO0001 Reserved for inherently not codable concepts without codable children: Secondary | ICD-10-CM | POA: Insufficient documentation

## 2013-05-21 DIAGNOSIS — Z8719 Personal history of other diseases of the digestive system: Secondary | ICD-10-CM | POA: Insufficient documentation

## 2013-05-21 DIAGNOSIS — IMO0002 Reserved for concepts with insufficient information to code with codable children: Secondary | ICD-10-CM | POA: Insufficient documentation

## 2013-05-21 DIAGNOSIS — R52 Pain, unspecified: Secondary | ICD-10-CM | POA: Insufficient documentation

## 2013-05-21 DIAGNOSIS — Z9089 Acquired absence of other organs: Secondary | ICD-10-CM | POA: Insufficient documentation

## 2013-05-21 DIAGNOSIS — I10 Essential (primary) hypertension: Secondary | ICD-10-CM | POA: Insufficient documentation

## 2013-05-21 DIAGNOSIS — R0789 Other chest pain: Secondary | ICD-10-CM | POA: Insufficient documentation

## 2013-05-21 DIAGNOSIS — Z794 Long term (current) use of insulin: Secondary | ICD-10-CM | POA: Insufficient documentation

## 2013-05-21 DIAGNOSIS — F411 Generalized anxiety disorder: Secondary | ICD-10-CM | POA: Insufficient documentation

## 2013-05-21 DIAGNOSIS — E1065 Type 1 diabetes mellitus with hyperglycemia: Secondary | ICD-10-CM | POA: Insufficient documentation

## 2013-05-21 DIAGNOSIS — R110 Nausea: Secondary | ICD-10-CM | POA: Insufficient documentation

## 2013-05-21 DIAGNOSIS — Z3202 Encounter for pregnancy test, result negative: Secondary | ICD-10-CM | POA: Insufficient documentation

## 2013-05-21 DIAGNOSIS — M791 Myalgia, unspecified site: Secondary | ICD-10-CM

## 2013-05-21 DIAGNOSIS — Z79899 Other long term (current) drug therapy: Secondary | ICD-10-CM | POA: Insufficient documentation

## 2013-05-21 DIAGNOSIS — E876 Hypokalemia: Secondary | ICD-10-CM | POA: Insufficient documentation

## 2013-05-21 LAB — I-STAT CHEM 8, ED
BUN: <3 mg/dL — ABNORMAL LOW (ref 6–23)
Calcium, Ion: 1.25 mmol/L — ABNORMAL HIGH (ref 1.12–1.23)
Chloride: 94 mEq/L — ABNORMAL LOW (ref 96–112)
Creatinine, Ser: 0.4 mg/dL — ABNORMAL LOW (ref 0.50–1.10)
Glucose, Bld: 213 mg/dL — ABNORMAL HIGH (ref 70–99)
HCT: 39 % (ref 36.0–46.0)
Hemoglobin: 13.3 g/dL (ref 12.0–15.0)
Potassium: 3.3 mEq/L — ABNORMAL LOW (ref 3.7–5.3)
Sodium: 140 mEq/L (ref 137–147)
TCO2: 31 mmol/L (ref 0–100)

## 2013-05-21 LAB — URINALYSIS, ROUTINE W REFLEX MICROSCOPIC
Bilirubin Urine: NEGATIVE
Glucose, UA: >1000 mg/dL — AB
Hgb urine dipstick: NEGATIVE
Ketones, ur: 15 mg/dL — AB
Leukocytes, UA: NEGATIVE
Nitrite: NEGATIVE
Protein, ur: NEGATIVE mg/dL
Specific Gravity, Urine: 1.037 — ABNORMAL HIGH (ref 1.005–1.030)
Urobilinogen, UA: 0.2 mg/dL (ref 0.0–1.0)
pH: 6.5 (ref 5.0–8.0)

## 2013-05-21 LAB — RAPID URINE DRUG SCREEN, HOSP PERFORMED
Amphetamines: NOT DETECTED
Barbiturates: NOT DETECTED
Benzodiazepines: NOT DETECTED
Cocaine: NOT DETECTED
Opiates: NOT DETECTED
Tetrahydrocannabinol: NOT DETECTED

## 2013-05-21 LAB — URINE MICROSCOPIC-ADD ON

## 2013-05-21 LAB — PREGNANCY, URINE: Preg Test, Ur: NEGATIVE

## 2013-05-21 LAB — I-STAT TROPONIN, ED: Troponin i, poc: 0 ng/mL (ref 0.00–0.08)

## 2013-05-21 MED ORDER — LORAZEPAM 2 MG/ML IJ SOLN
1.0000 mg | Freq: Once | INTRAMUSCULAR | Status: AC
  Administered 2013-05-21: 1 mg via INTRAVENOUS
  Filled 2013-05-21: qty 1

## 2013-05-21 MED ORDER — CYCLOBENZAPRINE HCL 10 MG PO TABS: 10.0000 mg | ORAL_TABLET | Freq: Three times a day (TID) | ORAL | Status: DC | PRN

## 2013-05-21 MED ORDER — NAPROXEN 250 MG PO TABS: 250.0000 mg | ORAL_TABLET | Freq: Two times a day (BID) | ORAL | Status: DC

## 2013-05-21 MED ORDER — SODIUM CHLORIDE 0.9 % IV SOLN
1000.0000 mL | Freq: Once | INTRAVENOUS | Status: AC
  Administered 2013-05-21: 1000 mL via INTRAVENOUS

## 2013-05-21 MED ORDER — KETOROLAC TROMETHAMINE 30 MG/ML IJ SOLN
30.0000 mg | Freq: Once | INTRAMUSCULAR | Status: AC
  Administered 2013-05-21: 30 mg via INTRAVENOUS
  Filled 2013-05-21: qty 2

## 2013-05-21 MED ORDER — DIPHENHYDRAMINE HCL 50 MG/ML IJ SOLN
25.0000 mg | Freq: Once | INTRAMUSCULAR | Status: AC
  Administered 2013-05-21: 25 mg via INTRAVENOUS
  Filled 2013-05-21: qty 1

## 2013-05-21 MED ORDER — POTASSIUM CHLORIDE CRYS ER 20 MEQ PO TBCR
40.0000 meq | EXTENDED_RELEASE_TABLET | Freq: Once | ORAL | Status: AC
  Administered 2013-05-21: 40 meq via ORAL
  Filled 2013-05-21: qty 2

## 2013-05-21 MED ORDER — LORAZEPAM 0.5 MG PO TABS
0.5000 mg | ORAL_TABLET | Freq: Once | ORAL | Status: DC
  Filled 2013-05-21: qty 1

## 2013-05-21 MED ORDER — POTASSIUM CHLORIDE CRYS ER 20 MEQ PO TBCR: 20.0000 meq | EXTENDED_RELEASE_TABLET | Freq: Two times a day (BID) | ORAL | Status: DC

## 2013-05-21 MED ORDER — SODIUM CHLORIDE 0.9 % IV SOLN
1000.0000 mL | INTRAVENOUS | Status: DC
  Administered 2013-05-21: 1000 mL via INTRAVENOUS

## 2013-05-21 NOTE — ED Provider Notes (Signed)
CSN: [id number]     Arrival date & time 05/21/13  7124 History   First MD Initiated Contact with Patient 05/21/13 [phone]     Chief Complaint  Patient presents with  . Chest Pain  . body pain      (Consider location/radiation/quality/duration/timing/severity/associated sxs/prior Treatment) HPI Patient presents to the emergency department with chest pain that she states started 1-1/2 hours ago. She states it woke her up. She's been having these chest pains she reports for 2 weeks. She states her last time her chest hurt was 3 days ago. This is her sixth ED visit for the chest pain in February. She has had thorough evaluations including negative D. dimers, troponins, and chest x-rays. She was admitted on January 28 through the 30th for her chest pain and hypotension with tachycardia which responded to IV fluids. At that time she had a 2-D echo showing an ejection fraction of 65-70%, no regional wall motion abnormalities, normal chamber sizes, and vigorous systolic function. She had a sedimentation rate of 17 in January. She's had several normal magnesium levels. Her TSH has been normal. She had been seen by a rheumatologist before she developed the chest pains for diffuse body pain and had a negative ANA and rheumatoid factor. She states the pain today is in the center of her chest and described as tightness. She states nothing she does makes it feel worse including deep breathing or movement, nothing she does makes it feel better. She states ibuprofen used to help and Percocet use to help but now neither helped with her pain. She has nausea without vomiting. She denies shortness of breath, diaphoresis, cough, fever. She denies swelling or pain in her calves. She reports her physician has been adding supplements such as magnesium for her chronic pain. She also had her gabapentin increased to 800 mg 4 times a day. She states she has not run out of any of her medication. She states she is to start aquatic  therapy next month. She also has a referral to cardiology on March 10. She reports she has been depressed and feels stressed because she is not working and she is under financial stress. She states she has been started on Cymbalta which she thinks has helped. She denies suicidal or homicidal ideation. She does not have a therapist that she sees for her depression. 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 She reports that is when her discomforts got worse. Patient was seen 4 times in December for generalized body pains, and 6 times in January for generalized body aches.  Patient states there is a family history of hypertension but no heart disease.  PCP Dr Dolly Rias  Past Medical History  Diagnosis Date  . Diabetes mellitus   . Hypertension   . Diabetes type 1, uncontrolled 11/14/2011    Since age 88  . Gastroparesis    Past Surgical History  Procedure Laterality Date  . Ankle surgery    . Cholecystectomy  11/15/2011    Procedure: LAPAROSCOPIC CHOLECYSTECTOMY WITH INTRAOPERATIVE CHOLANGIOGRAM;  Surgeon: [name], MD;  Location: [facility];  Service: General;  Laterality: N/A;  . Laparoscopy  11/23/2011    Procedure: LAPAROSCOPY DIAGNOSTIC;  Surgeon: Mariella Saa, MD;  Location: [facility];  Service: General;  Laterality: N/A;  . Esophagogastroduodenoscopy  12/03/2011    Procedure: ESOPHAGOGASTRODUODENOSCOPY (EGD);  Surgeon: Theda Belfast, MD;  Location: [facility];  Service: Endoscopy;  Laterality: N/A;   Family History  Problem Relation Age of Onset  . Diabetes Mother   . Hypertension Father    History  Substance Use Topics  . Smoking status: Never Smoker   . Smokeless tobacco: Never Used  . Alcohol Use: Yes     Comment: Occasional   Lives at home Unemployed, quit working 6 weeks ago   OB History   Grav Para Term Preterm Abortions TAB SAB Ect Mult Living                 Review of Systems  All other  systems reviewed and are negative.      Allergies  Peanut-containing drug products  Home Medications   Current Outpatient Rx  Name  Route  Sig  Dispense  Refill  . citalopram (CELEXA) 20 MG tablet   Oral   Take 20 mg by mouth at bedtime.         . cyclobenzaprine (FLEXERIL) 10 MG tablet   Oral   Take 1 tablet (10 mg total) by mouth 3 (three) times daily as needed for muscle spasms (muscle pain).   30 tablet   0   . dicyclomine (BENTYL) 10 MG capsule   Oral   Take 10 mg by mouth 4 (four) times daily -  before meals and at bedtime.         . DULoxetine (CYMBALTA) 30 MG capsule   Oral   Take 30 mg by mouth at bedtime.         . enalapril (VASOTEC) 10 MG tablet   Oral   Take 10 mg by mouth every evening.          . gabapentin (NEURONTIN) 400 MG capsule   Oral   Take 2 capsules (800 mg total) by mouth 4 (four) times daily.   240 capsule   5   . insulin glargine (LANTUS) 100 UNIT/ML injection   Subcutaneous   Inject 40 Units into the skin at bedtime.         . insulin lispro (HUMALOG) 100 UNIT/ML injection   Subcutaneous   Inject 5-20 Units into the skin 4 (four) times daily -  before meals and at bedtime. Inject 5 units (base) and additional units according to sliding scale- sliding scale: CBG 70 - 120: 0 uunits: CBG 121 - 150: 2 units; CBG 151 - 200: 3 units; CBG 201 - 250: 5 uunits; CBG 251 - 300: 8 units;CBG 301 - 350: 11 units; CBG 351 - 400: 15 units; CBG > 400 Call MD         . metoCLOPramide (REGLAN) 10 MG tablet   Oral   Take 1 tablet (10 mg total) by mouth 4 (four) times daily.   120 tablet   0   . metoprolol tartrate (LOPRESSOR) 25 MG tablet   Oral   Take 0.5 tablets (12.5 mg total) by mouth 2 (two) times daily.   30 tablet   1   . oxyCODONE-acetaminophen (PERCOCET/ROXICET) 5-325 MG per tablet   Oral   Take 1 tablet by mouth every 6 (six) hours as needed for severe pain.   15 tablet   0   . promethazine (PHENERGAN) 25 MG tablet    Oral   Take 25 mg by mouth every 6 (six) hours as needed for nausea or vomiting. For nausea          BP 151/102  Pulse 116  Temp(Src) 97.8 F (36.6 C) (Oral)  Resp 17  SpO2 100%  LMP 05/14/2013  Vital signs normal except tachycardia  Physical Exam  Nursing note and vitals reviewed. Constitutional: She is oriented to person, place, and time. She appears well-developed and well-nourished.  Non-toxic appearance. She does not appear ill. She appears distressed.  HENT:  Head: Normocephalic and atraumatic.  Right Ear: External ear normal.  Left Ear: External ear normal.  Nose: Nose normal. No mucosal edema or rhinorrhea.  Mouth/Throat: Oropharynx is clear and moist and mucous membranes are normal. No dental abscesses or uvula swelling.  Eyes: Conjunctivae and EOM are normal. Pupils are equal, round, and reactive to light.  Neck: Normal range of motion and full passive range of motion without pain. Neck supple.  Cardiovascular: Normal rate, regular rhythm and normal heart sounds.  Exam reveals no gallop and no friction rub.   No murmur heard. Pulmonary/Chest: Effort normal and breath sounds normal. No respiratory distress. She has no wheezes. She has no rhonchi. She has no rales. She exhibits no tenderness and no crepitus.  Abdominal: Soft. Normal appearance and bowel sounds are normal. She exhibits no distension. There is no tenderness. There is no rebound and no guarding.  Musculoskeletal: Normal range of motion. She exhibits no edema and no tenderness.  Moves all extremities well.   Neurological: She is alert and oriented to person, place, and time. She has normal strength. No cranial nerve deficit.  Skin: Skin is warm, dry and intact. No rash noted. No erythema. No pallor.  Psychiatric: Her behavior is normal. Her mood appears anxious. Her speech is rapid and/or pressured.  Seems distressed tearful clutching her chest    ED Course  Procedures (including critical care  time)  Medications  0.9 %  sodium chloride infusion (0 mLs Intravenous Stopped 05/21/13 0947)    Followed by  0.9 %  sodium chloride infusion (1,000 mLs Intravenous New Bag/Given 05/21/13 0946)  ketorolac (TORADOL) 30 MG/ML injection 30 mg (30 mg Intravenous Given 05/21/13 0851)  diphenhydrAMINE (BENADRYL) injection 25 mg (25 mg Intravenous Given 05/21/13 0850)  LORazepam (ATIVAN) injection 1 mg (1 mg Intravenous Given 05/21/13 0849)  potassium chloride SA (K-DUR,KLOR-CON) CR tablet 40 mEq (40 mEq Oral Given 05/21/13 0946)   Review of the [facility] database shows the patient has received 12 narcotic prescriptions since December 27, all from the emergency department or the hospitalist service. In February she received on February 4 #60 oxycodone 5/325, on February 18 #15 oxycodone 5/325. In January she received a total of #142 tablets of oxycodone 5/325 from 7 prescriptions.   10:00 patient states her pain is better, ready to go home. Pt has atypical chest pain that has been evaluated several times with prior ED visits. She has a cardiology appointment in March, but her pain does not sound cardiac in nature. Pt has chronic pain and her last hospitalization she mentioned she wants to apply for disability. She seems to have a large psychological component to her pain.  She may benefit from psychiatric evaluation for her anxiety and depression.   Labs Review Results for orders placed during the hospital encounter of 05/21/13  URINALYSIS, ROUTINE W REFLEX MICROSCOPIC      Result Value Ref Range   Color, Urine YELLOW  YELLOW   APPearance CLEAR  CLEAR   Specific Gravity, Urine 1.037 (*) 1.005 - 1.030   pH 6.5  5.0 - 8.0   Glucose, UA >1000 (*) NEGATIVE mg/dL   Hgb urine dipstick NEGATIVE  NEGATIVE   Bilirubin Urine NEGATIVE  NEGATIVE   Ketones, ur 15 (*) NEGATIVE mg/dL   Protein, ur NEGATIVE  NEGATIVE mg/dL   Urobilinogen, UA 0.2  0.0 - 1.0 mg/dL   Nitrite NEGATIVE  NEGATIVE   Leukocytes, UA  NEGATIVE  NEGATIVE  URINE RAPID DRUG SCREEN (HOSP PERFORMED)      Result Value Ref Range   Opiates NONE DETECTED  NONE DETECTED   Cocaine NONE DETECTED  NONE DETECTED   Benzodiazepines NONE DETECTED  NONE DETECTED   Amphetamines NONE DETECTED  NONE DETECTED   Tetrahydrocannabinol NONE DETECTED  NONE DETECTED   Barbiturates NONE DETECTED  NONE DETECTED  PREGNANCY, URINE      Result Value Ref Range   Preg Test, Ur NEGATIVE  NEGATIVE  URINE MICROSCOPIC-ADD ON      Result Value Ref Range   Squamous Epithelial / LPF RARE  RARE   WBC, UA 0-2  <3 WBC/hpf  I-STAT CHEM 8, ED      Result Value Ref Range   Sodium 140  137 - 147 mEq/L   Potassium 3.3 (*) 3.7 - 5.3 mEq/L   Chloride 94 (*) 96 - 112 mEq/L   BUN <3 (*) 6 - 23 mg/dL   Creatinine, Ser 0.450.40 (*) 0.50 - 1.10 mg/dL   Glucose, Bld 409213 (*) 70 - 99 mg/dL   Calcium, Ion 8.111.25 (*) 1.12 - 1.23 mmol/L   TCO2 31  0 - 100 mmol/L   Hemoglobin 13.3  12.0 - 15.0 g/dL   HCT 91.439.0  78.236.0 - 95.646.0 %  I-STAT TROPOININ, ED      Result Value Ref Range   Troponin i, poc 0.00  0.00 - 0.08 ng/mL   Comment 3             Laboratory interpretation all normal except concentrated UA c/w dehydration, hyperglycemia (stable), hypokalemia.   Imaging Review  Dg Chest 2 View  05/09/2013   CLINICAL DATA:  Chest pain. Marland Kitchen.  IMPRESSION: No active cardiopulmonary disease.   Electronically Signed   By: Salome HolmesHector  Cooper M.D.   On: 05/09/2013 21:36   Dg Chest 2 View  05/07/2013   CLINICAL DATA:  Chest pain  le.  IMPRESSION: No active cardiopulmonary disease.   Electronically Signed   By: Salome HolmesHector  Cooper M.D.   On: 05/07/2013 07:28   Dg Chest Portable 1 View  04/22/2013   CLINICAL DATA:  Chest discomfort. Body aches. Diabetes and hypertension.  IMPRESSION: No active disease.   Electronically Signed   By: Myles RosenthalJohn  Stahl M.D.   On: 04/22/2013 18:08        EKG Interpretation    Date/Time:  Saturday May 21 2013 08:45:58 EST Ventricular Rate:  120 PR Interval:  135 QRS  Duration: 74 QT Interval:  335 QTC Calculation: 473 R Axis:   64 Text Interpretation:  Sinus tachycardia Borderline T abnormalities, inferior leads Baseline wander in lead(s) I III aVL V1 V6 No significant change since last tracing 18 May 2013 of note patient has had tachycardia since EKG in June 2013 Confirmed by [facility]  MD-I, Latica Hohmann ([id number]) on 05/21/2013 9:09:33 AM            MDM   Final diagnoses:  Atypical chest pain  Myalgia  Hypokalemia    New Prescriptions   CYCLOBENZAPRINE (FLEXERIL) 10 MG TABLET    Take 1 tablet (10 mg total) by mouth 3 (three) times daily as needed for muscle spasms.   NAPROXEN (NAPROSYN) 250 MG TABLET    Take 1 tablet (250  mg total) by mouth 2 (two) times daily with a meal.   POTASSIUM CHLORIDE SA (K-DUR,KLOR-CON) 20 MEQ TABLET    Take 1 tablet (20 mEq total) by mouth 2 (two) times daily.    Plan discharge   Devoria Albe, MD, Franz Dell, MD 05/21/13 [phone]

## 2013-05-21 NOTE — ED Notes (Signed)
Per pt, states she woke up with chest pain and body pain-states MDs can not find out what's wrong with her

## 2013-05-21 NOTE — Discharge Instructions (Signed)
Follow up with Dr Renae Gloss about further evaluation of your body aches. Keep your appointment with the cardiologist on March 10 th.Take the medications as prescribed.

## 2013-05-22 ENCOUNTER — Emergency Department (HOSPITAL_COMMUNITY): Payer: 59

## 2013-05-22 ENCOUNTER — Encounter (HOSPITAL_COMMUNITY): Payer: Self-pay | Admitting: Emergency Medicine

## 2013-05-22 ENCOUNTER — Emergency Department (HOSPITAL_COMMUNITY)
Admission: EM | Admit: 2013-05-22 | Discharge: 2013-05-22 | Disposition: A | Discharge status: Home / Self Care | Payer: 59 | Attending: Emergency Medicine | Admitting: Emergency Medicine

## 2013-05-22 DIAGNOSIS — IMO0002 Reserved for concepts with insufficient information to code with codable children: Secondary | ICD-10-CM | POA: Insufficient documentation

## 2013-05-22 DIAGNOSIS — Z79899 Other long term (current) drug therapy: Secondary | ICD-10-CM | POA: Insufficient documentation

## 2013-05-22 DIAGNOSIS — F419 Anxiety disorder, unspecified: Secondary | ICD-10-CM

## 2013-05-22 DIAGNOSIS — E1065 Type 1 diabetes mellitus with hyperglycemia: Secondary | ICD-10-CM | POA: Insufficient documentation

## 2013-05-22 DIAGNOSIS — R0989 Other specified symptoms and signs involving the circulatory and respiratory systems: Secondary | ICD-10-CM | POA: Insufficient documentation

## 2013-05-22 DIAGNOSIS — Z791 Long term (current) use of non-steroidal anti-inflammatories (NSAID): Secondary | ICD-10-CM | POA: Insufficient documentation

## 2013-05-22 DIAGNOSIS — R0789 Other chest pain: Secondary | ICD-10-CM | POA: Insufficient documentation

## 2013-05-22 DIAGNOSIS — F411 Generalized anxiety disorder: Secondary | ICD-10-CM | POA: Insufficient documentation

## 2013-05-22 DIAGNOSIS — Z8719 Personal history of other diseases of the digestive system: Secondary | ICD-10-CM | POA: Insufficient documentation

## 2013-05-22 DIAGNOSIS — R0609 Other forms of dyspnea: Secondary | ICD-10-CM | POA: Insufficient documentation

## 2013-05-22 DIAGNOSIS — Z3202 Encounter for pregnancy test, result negative: Secondary | ICD-10-CM | POA: Insufficient documentation

## 2013-05-22 DIAGNOSIS — Z794 Long term (current) use of insulin: Secondary | ICD-10-CM | POA: Insufficient documentation

## 2013-05-22 DIAGNOSIS — I10 Essential (primary) hypertension: Secondary | ICD-10-CM | POA: Insufficient documentation

## 2013-05-22 LAB — COMPREHENSIVE METABOLIC PANEL
ALT: 21 U/L (ref 0–35)
AST: 22 U/L (ref 0–37)
Albumin: 3.6 g/dL (ref 3.5–5.2)
Alkaline Phosphatase: 97 U/L (ref 39–117)
BUN: 7 mg/dL (ref 6–23)
CO2: 26 mEq/L (ref 19–32)
Calcium: 9.5 mg/dL (ref 8.4–10.5)
Chloride: 95 mEq/L — ABNORMAL LOW (ref 96–112)
Creatinine, Ser: 0.27 mg/dL — ABNORMAL LOW (ref 0.50–1.10)
GFR calc Af Amer: >90 mL/min (ref >90)
GFR calc non Af Amer: >90 mL/min (ref >90)
Glucose, Bld: 310 mg/dL — ABNORMAL HIGH (ref 70–99)
Potassium: 3.9 mEq/L (ref 3.7–5.3)
Sodium: 135 mEq/L — ABNORMAL LOW (ref 137–147)
Total Bilirubin: 0.3 mg/dL (ref 0.3–1.2)
Total Protein: 7.1 g/dL (ref 6.0–8.3)

## 2013-05-22 LAB — CBC WITH DIFFERENTIAL/PLATELET
Basophils Absolute: 0 10*3/uL (ref 0.0–0.1)
Basophils Relative: 0 % (ref 0–1)
Eosinophils Absolute: 0.1 10*3/uL (ref 0.0–0.7)
Eosinophils Relative: 2 % (ref 0–5)
HCT: 34.9 % — ABNORMAL LOW (ref 36.0–46.0)
Hemoglobin: 12.7 g/dL (ref 12.0–15.0)
Lymphocytes Relative: 41 % (ref 12–46)
Lymphs Abs: 1.9 10*3/uL (ref 0.7–4.0)
MCH: 30.8 pg (ref 26.0–34.0)
MCHC: 36.4 g/dL — ABNORMAL HIGH (ref 30.0–36.0)
MCV: 84.7 fL (ref 78.0–100.0)
Monocytes Absolute: 0.3 10*3/uL (ref 0.1–1.0)
Monocytes Relative: 6 % (ref 3–12)
Neutro Abs: 2.3 10*3/uL (ref 1.7–7.7)
Neutrophils Relative %: 50 % (ref 43–77)
Platelets: 231 10*3/uL (ref 150–400)
RBC: 4.12 MIL/uL (ref 3.87–5.11)
RDW: 11.4 % — ABNORMAL LOW (ref 11.5–15.5)
WBC: 4.7 10*3/uL (ref 4.0–10.5)

## 2013-05-22 LAB — URINALYSIS, ROUTINE W REFLEX MICROSCOPIC
Bilirubin Urine: NEGATIVE
Glucose, UA: >1000 mg/dL — AB
Hgb urine dipstick: NEGATIVE
Ketones, ur: NEGATIVE mg/dL
Leukocytes, UA: NEGATIVE
Nitrite: NEGATIVE
Protein, ur: NEGATIVE mg/dL
Specific Gravity, Urine: >1.046 — ABNORMAL HIGH (ref 1.005–1.030)
Urobilinogen, UA: 0.2 mg/dL (ref 0.0–1.0)
pH: 6.5 (ref 5.0–8.0)

## 2013-05-22 LAB — URINE MICROSCOPIC-ADD ON

## 2013-05-22 LAB — CBG MONITORING, ED
Glucose-Capillary: 315 mg/dL — ABNORMAL HIGH (ref 70–99)
Glucose-Capillary: 171 mg/dL — ABNORMAL HIGH (ref 70–99)

## 2013-05-22 LAB — POC URINE PREG, ED: Preg Test, Ur: NEGATIVE

## 2013-05-22 IMAGING — CT CT ANGIO CHEST
1 of 2 series · 20 of 32 positions shown · IV contrast (OMNIPAQUE 300)
Comparison: [DATE]

CLINICAL DATA: Multiple episodes of chest pain in chest tightness
with multiple ED visits, history diabetes, hypertension

EXAM:
CT ANGIOGRAPHY CHEST WITH CONTRAST
TECHNIQUE: Multidetector CT imaging of the chest was performed using the
standard protocol during bolus administration of intravenous
contrast. Multiplanar CT image reconstructions and MIPs were
obtained to evaluate the vascular anatomy.
CONTRAST:  100mL OMNIPAQUE IOHEXOL 350 MG/ML SOLN

[Series 6: thins for pacs · axial · 0.61mm/px · z∈[-276,-50]mm · 20 of 248 slices shown]
[im 11/248  lung]
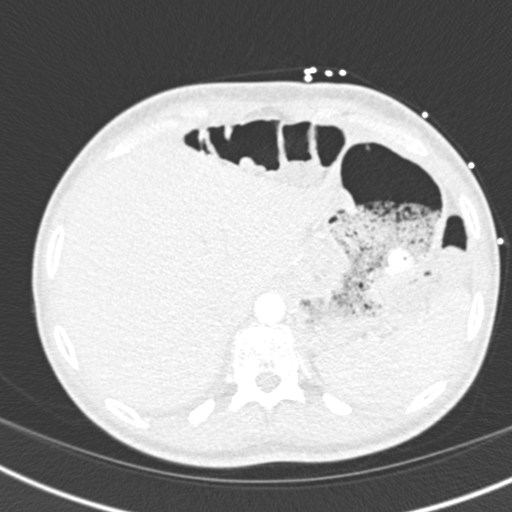
[im 22/248  soft-tissue]
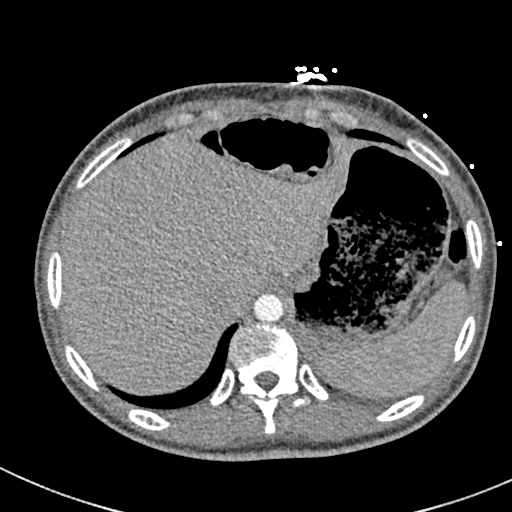
[im 33/248  lung]
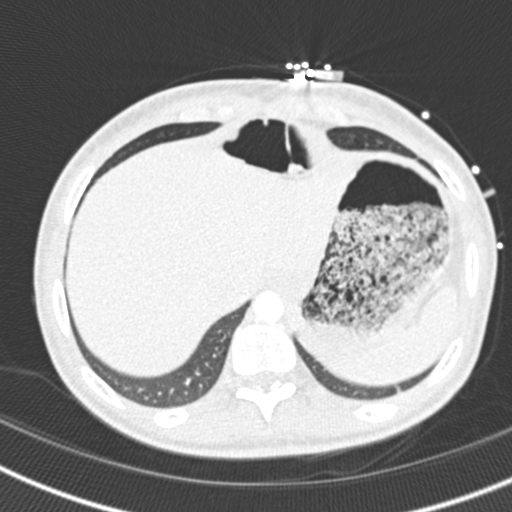
[im 43/248  soft-tissue]
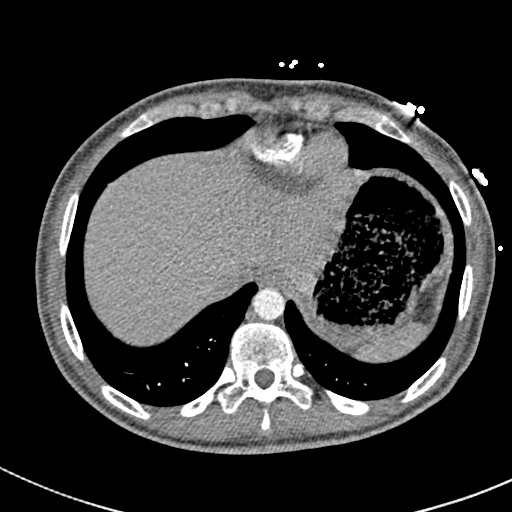
[im 54/248  lung]
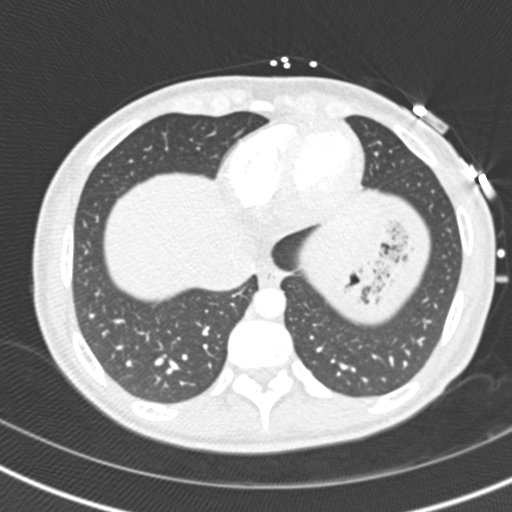
[im 76/248  soft-tissue]
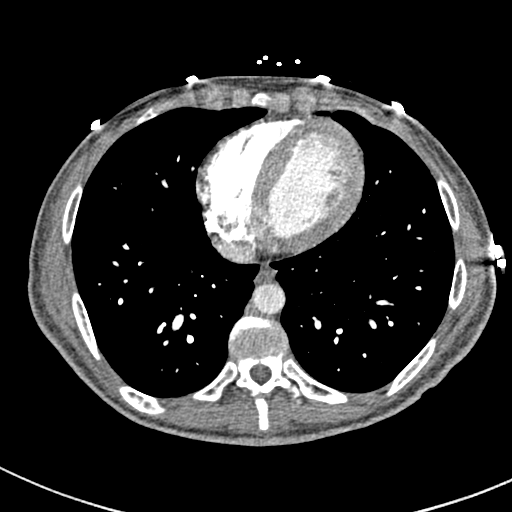
[im 86/248  lung]
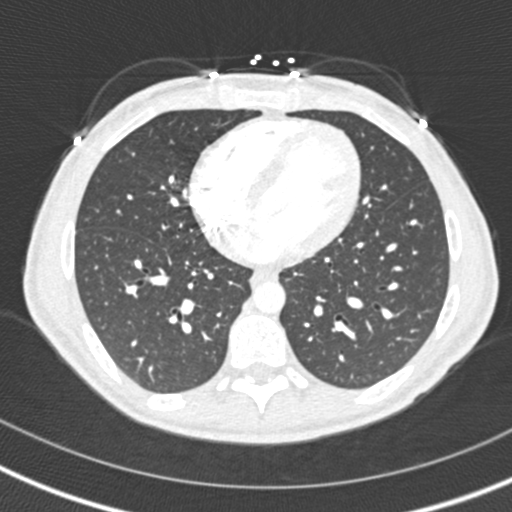
[im 97/248  soft-tissue]
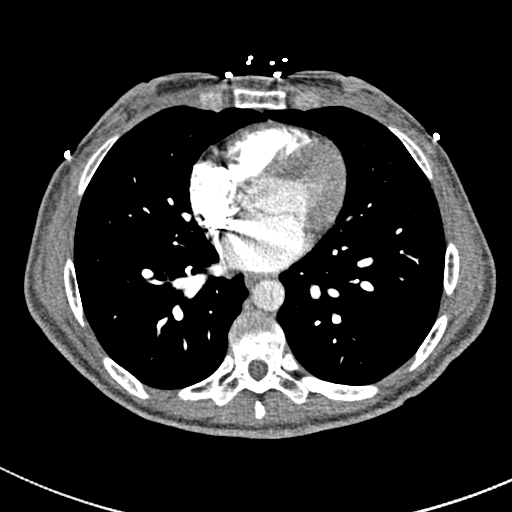
[im 108/248  lung]
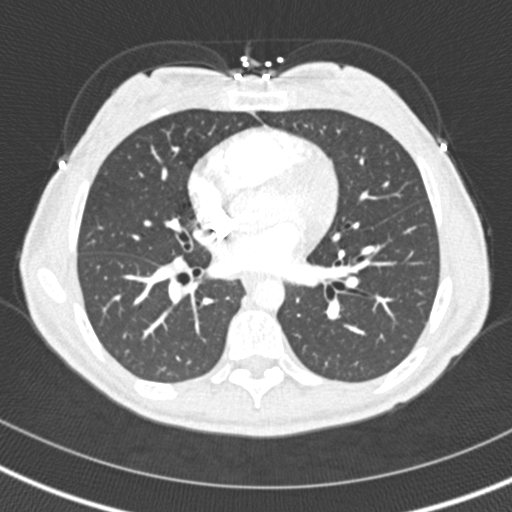
[im 119/248  soft-tissue]
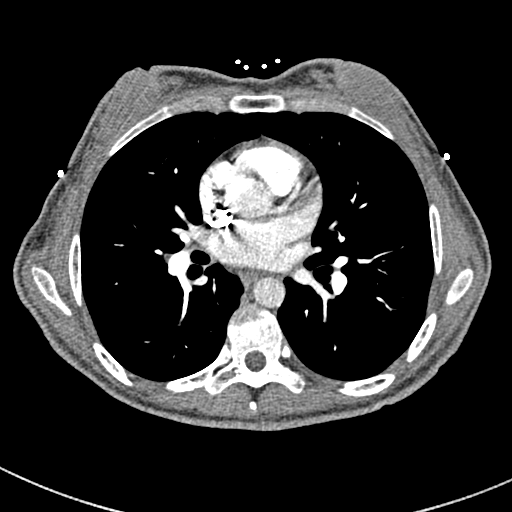
[im 129/248  lung]
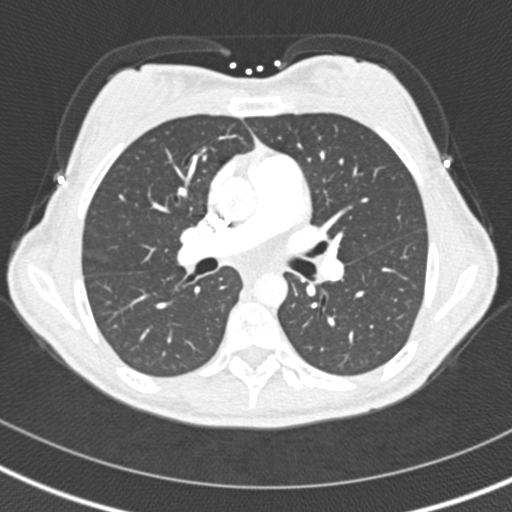
[im 140/248  soft-tissue]
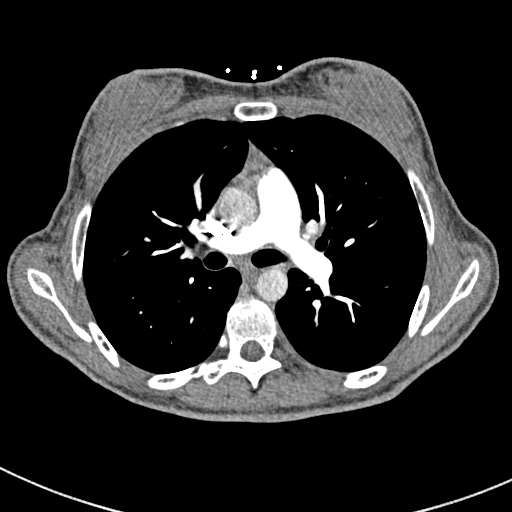
[im 151/248  lung]
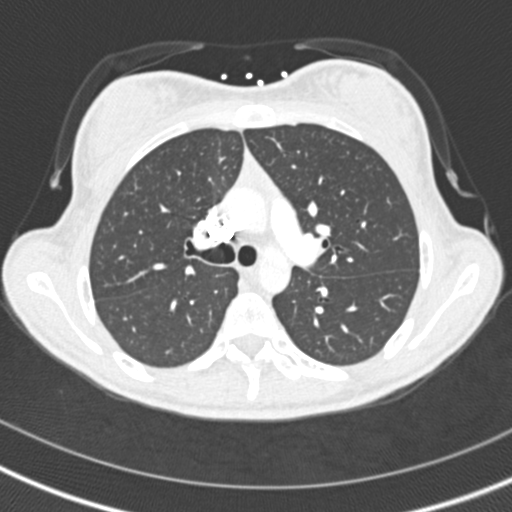
[im 162/248  soft-tissue]
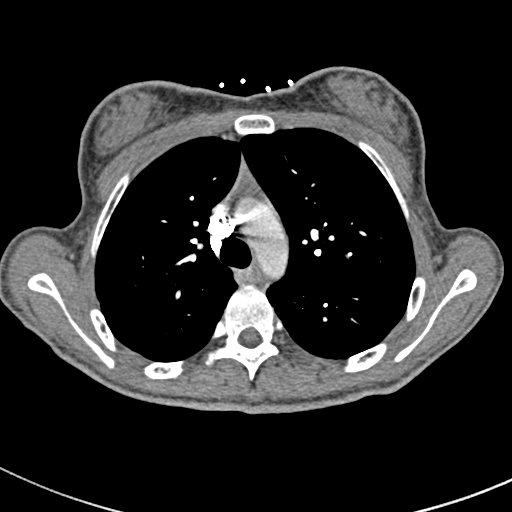
[im 172/248  lung]
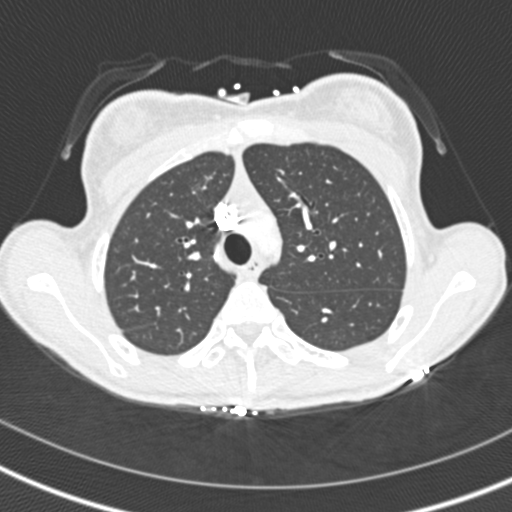
[im 194/248  soft-tissue]
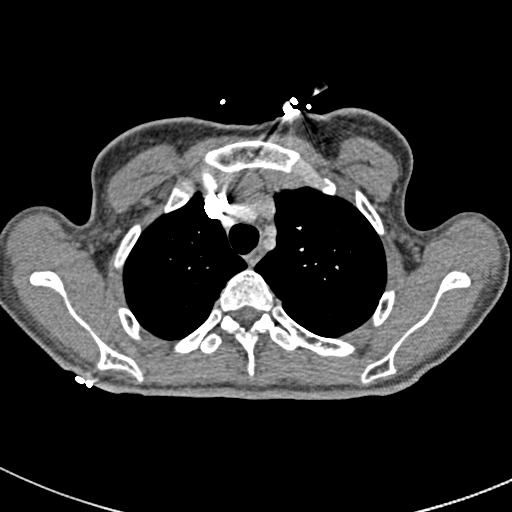
[im 205/248  lung]
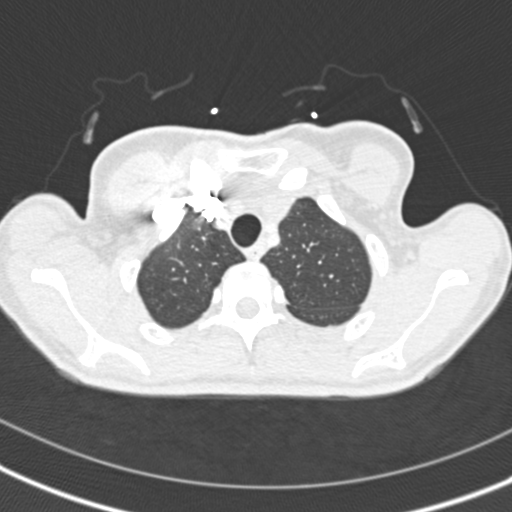
[im 215/248  soft-tissue]
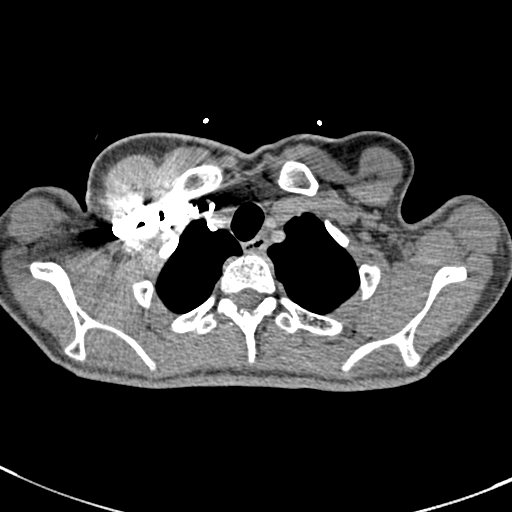
[im 226/248  lung]
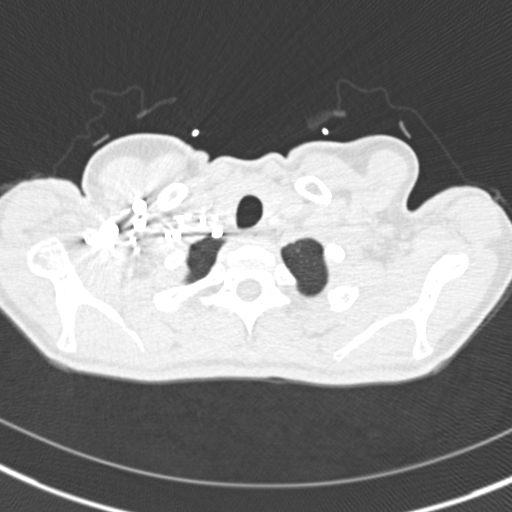
[im 237/248  soft-tissue]
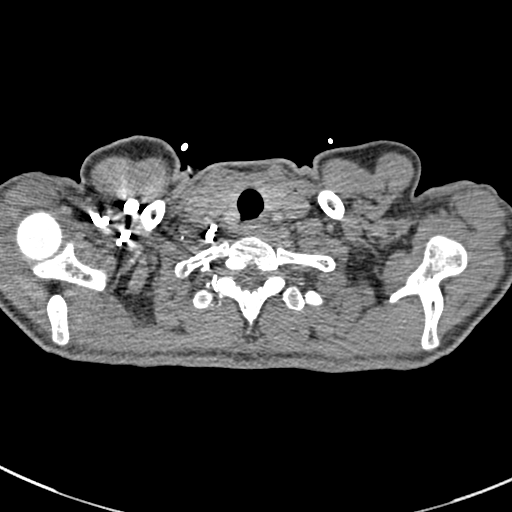

[20 of 32 positions shown; findings below may reference images not displayed]

FINDINGS: Aorta normal caliber without aneurysm or dissection.

Residual thymic tissue in anterior mediastinum.

No thoracic adenopathy.

Visualized portion of upper abdomen normal appearance.

Pulmonary arteries well opacified and patent.

No evidence of pulmonary embolism.

Lungs clear.

No infiltrate, pleural effusion, pneumothorax or mass/nodule.

Bones unremarkable.

Review of the MIP images confirms the above findings.
IMPRESSION: Normal CTA chest.

## 2013-05-22 MED ORDER — SODIUM CHLORIDE 0.9 % IV SOLN
INTRAVENOUS | Status: DC
  Administered 2013-05-22: 11:00:00 via INTRAVENOUS

## 2013-05-22 MED ORDER — SODIUM CHLORIDE 0.9 % IV BOLUS (SEPSIS)
1000.0000 mL | Freq: Once | INTRAVENOUS | Status: AC
  Administered 2013-05-22: 1000 mL via INTRAVENOUS

## 2013-05-22 MED ORDER — SODIUM CHLORIDE 0.9 % IV BOLUS (SEPSIS)
1000.0000 mL | Freq: Once | INTRAVENOUS | Status: AC
Start: 2013-05-22 — End: 2013-05-22
  Administered 2013-05-22: 1000 mL via INTRAVENOUS

## 2013-05-22 MED ORDER — LORAZEPAM 2 MG/ML IJ SOLN
1.0000 mg | Freq: Once | INTRAMUSCULAR | Status: AC
  Administered 2013-05-22: 1 mg via INTRAVENOUS
  Filled 2013-05-22: qty 1

## 2013-05-22 MED ORDER — IOHEXOL 350 MG/ML SOLN
100.0000 mL | Freq: Once | INTRAVENOUS | Status: AC | PRN
  Administered 2013-05-22: 100 mL via INTRAVENOUS

## 2013-05-22 NOTE — ED Notes (Signed)
Pt to CT

## 2013-05-22 NOTE — Discharge Instructions (Signed)
Continue taking your anxiety medications and followup with your Dr. next week Panic Attacks Panic attacks are sudden, short-livedsurges of severe anxiety, fear, or discomfort. They may occur for no reason when you are relaxed, when you are anxious, or when you are sleeping. Panic attacks may occur for a number of reasons:   Healthy people occasionally have panic attacks in extreme, life-threatening situations, such as war or natural disasters. Normal anxiety is a protective mechanism of the body that helps [place] react to danger (fight or flight response).  Panic attacks are often seen with anxiety disorders, such as panic disorder, social anxiety disorder, generalized anxiety disorder, and phobias. Anxiety disorders cause excessive or uncontrollable anxiety. They may interfere with your relationships or other life activities.  Panic attacks are sometimes seen with other mental illnesses such as depression and posttraumatic stress disorder.  Certain medical conditions, prescription medicines, and drugs of abuse can cause panic attacks. SYMPTOMS  Panic attacks start suddenly, peak within 20 minutes, and are accompanied by four or more of the following symptoms:  Pounding heart or fast heart rate (palpitations).  Sweating.  Trembling or shaking.  Shortness of breath or feeling smothered.  Feeling choked.  Chest pain or discomfort.  Nausea or strange feeling in your stomach.  Dizziness, lightheadedness, or feeling like you will faint.  Chills or hot flushes.  Numbness or tingling in your lips or hands and feet.  Feeling that things are not real or feeling that you are not yourself.  Fear of losing control or going crazy.  Fear of dying. Some of these symptoms can mimic serious medical conditions. For example, you may think you are having a heart attack. Although panic attacks can be very scary, they are not life threatening. DIAGNOSIS  Panic attacks are diagnosed through an  assessment by your health care provider. Your health care provider will ask questions about your symptoms, such as where and when they occurred. Your health care provider will also ask about your medical history and use of alcohol and drugs, including prescription medicines. Your health care provider may order blood tests or other studies to rule out a serious medical condition. Your health care provider may refer you to a mental health professional for further evaluation. TREATMENT   Most healthy people who have one or two panic attacks in an extreme, life-threatening situation will not require treatment.  The treatment for panic attacks associated with anxiety disorders or other mental illness typically involves counseling with a mental health professional, medicine, or a combination of both. Your health care provider will help determine what treatment is best for you.  Panic attacks due to physical illness usually goes away with treatment of the illness. If prescription medicine is causing panic attacks, talk with your health care provider about stopping the medicine, decreasing the dose, or substituting another medicine.  Panic attacks due to alcohol or drug abuse goes away with abstinence. Some adults need professional help in order to stop drinking or using drugs. HOME CARE INSTRUCTIONS   Take all your medicines as prescribed.   Check with your health care provider before starting new prescription or over-the-counter medicines.  Keep all follow up appointments with your health care provider. SEEK MEDICAL CARE IF:  You are not able to take your medicines as prescribed.  Your symptoms do not improve or get worse. SEEK IMMEDIATE MEDICAL CARE IF:   You experience panic attack symptoms that are different than your usual symptoms.  You have serious thoughts about hurting  yourself or others.  You are taking medicine for panic attacks and have a serious side effect. MAKE SURE  YOU:  Understand these instructions.  Will watch your condition.  Will get help right away if you are not doing well or get worse. Document Released: 03/17/2005 Document Revised: 01/05/2013 Document Reviewed: 10/29/2012 [facility] Patient Information 2014 [place].

## 2013-05-22 NOTE — ED Provider Notes (Signed)
CSN: [id number]     Arrival date & time 05/22/13  9449 History   First MD Initiated Contact with Patient 05/22/13 0932     Chief Complaint  Patient presents with  . Generalized Pain      (Consider location/radiation/quality/duration/timing/severity/associated sxs/prior Treatment) The history is provided by the patient.   patient here complaining of persistent midsternal chest discomfort x2-3 weeks. Has been seen multiple times for similar complaints. Notes increased anxiety and is on Cymbalta for this. Seen most recently yesterday with an evaluation for ACS that was negative. She notes subjective dyspnea that is worse when she becomes ranges. Denies any syncope or near-syncope. Has been using her opiate medications without relief. Does have cardiology referral scheduled. Has had a recent echocardiogram which showed a good EF. Nothing makes her symptoms better.  Past Medical History  Diagnosis Date  . Diabetes mellitus   . Hypertension   . Diabetes type 1, uncontrolled 11/14/2011    Since age 27  . Gastroparesis    Past Surgical History  Procedure Laterality Date  . Ankle surgery    . Cholecystectomy  11/15/2011    Procedure: LAPAROSCOPIC CHOLECYSTECTOMY WITH INTRAOPERATIVE CHOLANGIOGRAM;  Surgeon: [name], MD;  Location: [facility];  Service: General;  Laterality: N/A;  . Laparoscopy  11/23/2011    Procedure: LAPAROSCOPY DIAGNOSTIC;  Surgeon: Edward Jolly, MD;  Location: [facility];  Service: General;  Laterality: N/A;  . Esophagogastroduodenoscopy  12/03/2011    Procedure: ESOPHAGOGASTRODUODENOSCOPY (EGD);  Surgeon: Beryle Beams, MD;  Location: [facility];  Service: Endoscopy;  Laterality: N/A;   Family History  Problem Relation Age of Onset  . Diabetes Mother   . Hypertension Father    History  Substance Use Topics  . Smoking status: Never Smoker   . Smokeless tobacco: Never Used  . Alcohol Use: Yes     Comment: Occasional   OB History   Grav Para Term Preterm  Abortions TAB SAB Ect Mult Living                 Review of Systems  All other systems reviewed and are negative.      Allergies  Peanut-containing drug products  Home Medications   Current Outpatient Rx  Name  Route  Sig  Dispense  Refill  . citalopram (CELEXA) 20 MG tablet   Oral   Take 20 mg by mouth at bedtime.         . cyclobenzaprine (FLEXERIL) 10 MG tablet   Oral   Take 1 tablet (10 mg total) by mouth 3 (three) times daily as needed for muscle spasms (muscle pain).   30 tablet   0   . cyclobenzaprine (FLEXERIL) 10 MG tablet   Oral   Take 1 tablet (10 mg total) by mouth 3 (three) times daily as needed for muscle spasms.   30 tablet   0   . dicyclomine (BENTYL) 10 MG capsule   Oral   Take 10 mg by mouth 4 (four) times daily -  before meals and at bedtime.         . DULoxetine (CYMBALTA) 30 MG capsule   Oral   Take 30 mg by mouth at bedtime.         . enalapril (VASOTEC) 10 MG tablet   Oral   Take 10 mg by mouth every evening.          . gabapentin (NEURONTIN) 400 MG capsule   Oral   Take 2 capsules (800  mg total) by mouth 4 (four) times daily.   240 capsule   5   . insulin glargine (LANTUS) 100 UNIT/ML injection   Subcutaneous   Inject 40 Units into the skin at bedtime.         . insulin lispro (HUMALOG) 100 UNIT/ML injection   Subcutaneous   Inject 5-20 Units into the skin 4 (four) times daily -  before meals and at bedtime. Inject 5 units (base) and additional units according to sliding scale- sliding scale: CBG 70 - 120: 0 uunits: CBG 121 - 150: 2 units; CBG 151 - 200: 3 units; CBG 201 - 250: 5 uunits; CBG 251 - 300: 8 units;CBG 301 - 350: 11 units; CBG 351 - 400: 15 units; CBG > 400 Call MD         . metoCLOPramide (REGLAN) 10 MG tablet   Oral   Take 1 tablet (10 mg total) by mouth 4 (four) times daily.   120 tablet   0   . metoprolol tartrate (LOPRESSOR) 25 MG tablet   Oral   Take 0.5 tablets (12.5 mg total) by mouth 2 (two)  times daily.   30 tablet   1   . naproxen (NAPROSYN) 250 MG tablet   Oral   Take 1 tablet (250 mg total) by mouth 2 (two) times daily with a meal.   30 tablet   0   . oxyCODONE-acetaminophen (PERCOCET/ROXICET) 5-325 MG per tablet   Oral   Take 1 tablet by mouth every 6 (six) hours as needed for severe pain.   15 tablet   0   . potassium chloride SA (K-DUR,KLOR-CON) 20 MEQ tablet   Oral   Take 1 tablet (20 mEq total) by mouth 2 (two) times daily.   12 tablet   0   . promethazine (PHENERGAN) 25 MG tablet   Oral   Take 25 mg by mouth every 6 (six) hours as needed for nausea or vomiting. For nausea          BP 109/78  Pulse 122  Temp(Src) 97.9 F (36.6 C) (Oral)  Resp 18  SpO2 100%  LMP 05/14/2013 Physical Exam  Nursing note and vitals reviewed. Constitutional: She is oriented to person, place, and time. She appears well-developed and well-nourished.  Non-toxic appearance. No distress.  HENT:  Head: Normocephalic and atraumatic.  Eyes: Conjunctivae, EOM and lids are normal. Pupils are equal, round, and reactive to light.  Neck: Normal range of motion. Neck supple. No tracheal deviation present. No mass present.  Cardiovascular: Normal rate, regular rhythm and normal heart sounds.  Exam reveals no gallop.   No murmur heard. Pulmonary/Chest: Effort normal and breath sounds normal. No stridor. No respiratory distress. She has no decreased breath sounds. She has no wheezes. She has no rhonchi. She has no rales.  Abdominal: Soft. Normal appearance and bowel sounds are normal. She exhibits no distension. There is no tenderness. There is no rebound and no CVA tenderness.  Musculoskeletal: Normal range of motion. She exhibits no edema and no tenderness.  Neurological: She is alert and oriented to person, place, and time. She has normal strength. No cranial nerve deficit or sensory deficit. GCS eye subscore is 4. GCS verbal subscore is 5. GCS motor subscore is 6.  Skin: Skin is  warm and dry. No abrasion and no rash noted.  Psychiatric: Her behavior is normal. Her mood appears anxious. Her speech is delayed.    ED Course  Procedures (including critical care time)  Labs Review Labs Reviewed  CBC WITH DIFFERENTIAL  COMPREHENSIVE METABOLIC PANEL  URINALYSIS, ROUTINE W REFLEX MICROSCOPIC  CBG MONITORING, ED  POC URINE PREG, ED   Imaging Review No results found.  EKG Interpretation    Date/Time:  Sunday May 22 2013 09:05:18 EST Ventricular Rate:  123 PR Interval:  131 QRS Duration: 78 QT Interval:  313 QTC Calculation: 448 R Axis:   70 Text Interpretation:  Sinus tachycardia Confirmed by Zenia Resides  MD, Austyn Perriello ([id number]) on 05/22/2013 9:58:36 AM            MDM   Final diagnoses:  None    Patient given IV fluids and Ativan for her anxiety as well as her mild hyperglycemia. Chest CT was negative here. Patient does admit to increased anxiety and is on Cymbalta and encourage her to continue taking this. She is stable for discharge    Leota Jacobsen, MD 05/22/13 1330

## 2013-05-22 NOTE — ED Notes (Signed)
Pt with chronic generalized body and chest pain.  Pt also c/o dizziness.

## 2013-05-23 ENCOUNTER — Emergency Department (HOSPITAL_COMMUNITY)
Admission: EM | Admit: 2013-05-23 | Discharge: 2013-05-23 | Disposition: A | Discharge status: Home / Self Care | Payer: 59 | Attending: Emergency Medicine | Admitting: Emergency Medicine

## 2013-05-23 ENCOUNTER — Encounter (HOSPITAL_COMMUNITY): Payer: Self-pay | Admitting: Emergency Medicine

## 2013-05-23 ENCOUNTER — Emergency Department (HOSPITAL_COMMUNITY): Payer: 59

## 2013-05-23 ENCOUNTER — Emergency Department (HOSPITAL_COMMUNITY)
Admission: EM | Admit: 2013-05-23 | Discharge: 2013-05-24 | Disposition: A | Discharge status: Home / Self Care | Payer: 59 | Attending: Emergency Medicine | Admitting: Emergency Medicine

## 2013-05-23 DIAGNOSIS — Z794 Long term (current) use of insulin: Secondary | ICD-10-CM | POA: Insufficient documentation

## 2013-05-23 DIAGNOSIS — R739 Hyperglycemia, unspecified: Secondary | ICD-10-CM

## 2013-05-23 DIAGNOSIS — Z9089 Acquired absence of other organs: Secondary | ICD-10-CM | POA: Insufficient documentation

## 2013-05-23 DIAGNOSIS — G8929 Other chronic pain: Secondary | ICD-10-CM

## 2013-05-23 DIAGNOSIS — E1065 Type 1 diabetes mellitus with hyperglycemia: Secondary | ICD-10-CM | POA: Insufficient documentation

## 2013-05-23 DIAGNOSIS — R000 Tachycardia, unspecified: Secondary | ICD-10-CM | POA: Insufficient documentation

## 2013-05-23 DIAGNOSIS — R0789 Other chest pain: Secondary | ICD-10-CM | POA: Insufficient documentation

## 2013-05-23 DIAGNOSIS — Z3202 Encounter for pregnancy test, result negative: Secondary | ICD-10-CM | POA: Insufficient documentation

## 2013-05-23 DIAGNOSIS — F411 Generalized anxiety disorder: Secondary | ICD-10-CM | POA: Insufficient documentation

## 2013-05-23 DIAGNOSIS — R079 Chest pain, unspecified: Secondary | ICD-10-CM

## 2013-05-23 DIAGNOSIS — IMO0001 Reserved for inherently not codable concepts without codable children: Secondary | ICD-10-CM | POA: Insufficient documentation

## 2013-05-23 DIAGNOSIS — Z79899 Other long term (current) drug therapy: Secondary | ICD-10-CM | POA: Insufficient documentation

## 2013-05-23 DIAGNOSIS — IMO0002 Reserved for concepts with insufficient information to code with codable children: Secondary | ICD-10-CM | POA: Insufficient documentation

## 2013-05-23 DIAGNOSIS — I10 Essential (primary) hypertension: Secondary | ICD-10-CM | POA: Insufficient documentation

## 2013-05-23 DIAGNOSIS — Z8719 Personal history of other diseases of the digestive system: Secondary | ICD-10-CM | POA: Insufficient documentation

## 2013-05-23 DIAGNOSIS — M2550 Pain in unspecified joint: Secondary | ICD-10-CM | POA: Insufficient documentation

## 2013-05-23 DIAGNOSIS — Z9889 Other specified postprocedural states: Secondary | ICD-10-CM | POA: Insufficient documentation

## 2013-05-23 DIAGNOSIS — R197 Diarrhea, unspecified: Secondary | ICD-10-CM | POA: Insufficient documentation

## 2013-05-23 LAB — BASIC METABOLIC PANEL
BUN: 4 mg/dL — ABNORMAL LOW (ref 6–23)
CO2: 28 mEq/L (ref 19–32)
Calcium: 9.9 mg/dL (ref 8.4–10.5)
Chloride: 97 mEq/L (ref 96–112)
Creatinine, Ser: 0.36 mg/dL — ABNORMAL LOW (ref 0.50–1.10)
GFR calc Af Amer: >90 mL/min (ref >90)
GFR calc non Af Amer: >90 mL/min (ref >90)
Glucose, Bld: 459 mg/dL — ABNORMAL HIGH (ref 70–99)
Potassium: 3.8 mEq/L (ref 3.7–5.3)
Sodium: 141 mEq/L (ref 137–147)

## 2013-05-23 LAB — URINALYSIS, DIPSTICK ONLY
Bilirubin Urine: NEGATIVE
Glucose, UA: >1000 mg/dL — AB
Hgb urine dipstick: NEGATIVE
Ketones, ur: NEGATIVE mg/dL
Leukocytes, UA: NEGATIVE
Nitrite: NEGATIVE
Protein, ur: NEGATIVE mg/dL
Specific Gravity, Urine: 1.036 — ABNORMAL HIGH (ref 1.005–1.030)
Urobilinogen, UA: 0.2 mg/dL (ref 0.0–1.0)
pH: 6 (ref 5.0–8.0)

## 2013-05-23 LAB — CBC
HCT: 37.3 % (ref 36.0–46.0)
Hemoglobin: 13.4 g/dL (ref 12.0–15.0)
MCH: 31 pg (ref 26.0–34.0)
MCHC: 35.9 g/dL (ref 30.0–36.0)
MCV: 86.3 fL (ref 78.0–100.0)
Platelets: 217 10*3/uL (ref 150–400)
RBC: 4.32 MIL/uL (ref 3.87–5.11)
RDW: 11.6 % (ref 11.5–15.5)
WBC: 4.1 10*3/uL (ref 4.0–10.5)

## 2013-05-23 LAB — CBG MONITORING, ED
Glucose-Capillary: 416 mg/dL — ABNORMAL HIGH (ref 70–99)
Glucose-Capillary: 322 mg/dL — ABNORMAL HIGH (ref 70–99)

## 2013-05-23 LAB — PREGNANCY, URINE: Preg Test, Ur: NEGATIVE

## 2013-05-23 LAB — I-STAT TROPONIN, ED: Troponin i, poc: 0 ng/mL (ref 0.00–0.08)

## 2013-05-23 IMAGING — CR DG CHEST 2V
2 series · 2 of 2 positions shown · non-contrast
Comparison: CT ANGIO CHEST W/CM &/OR WO/CM dated [DATE]; DG
CHEST 2 VIEW dated [DATE]

CLINICAL DATA: CHEST PAIN

EXAM:
CHEST  2 VIEW

[w chest pa]
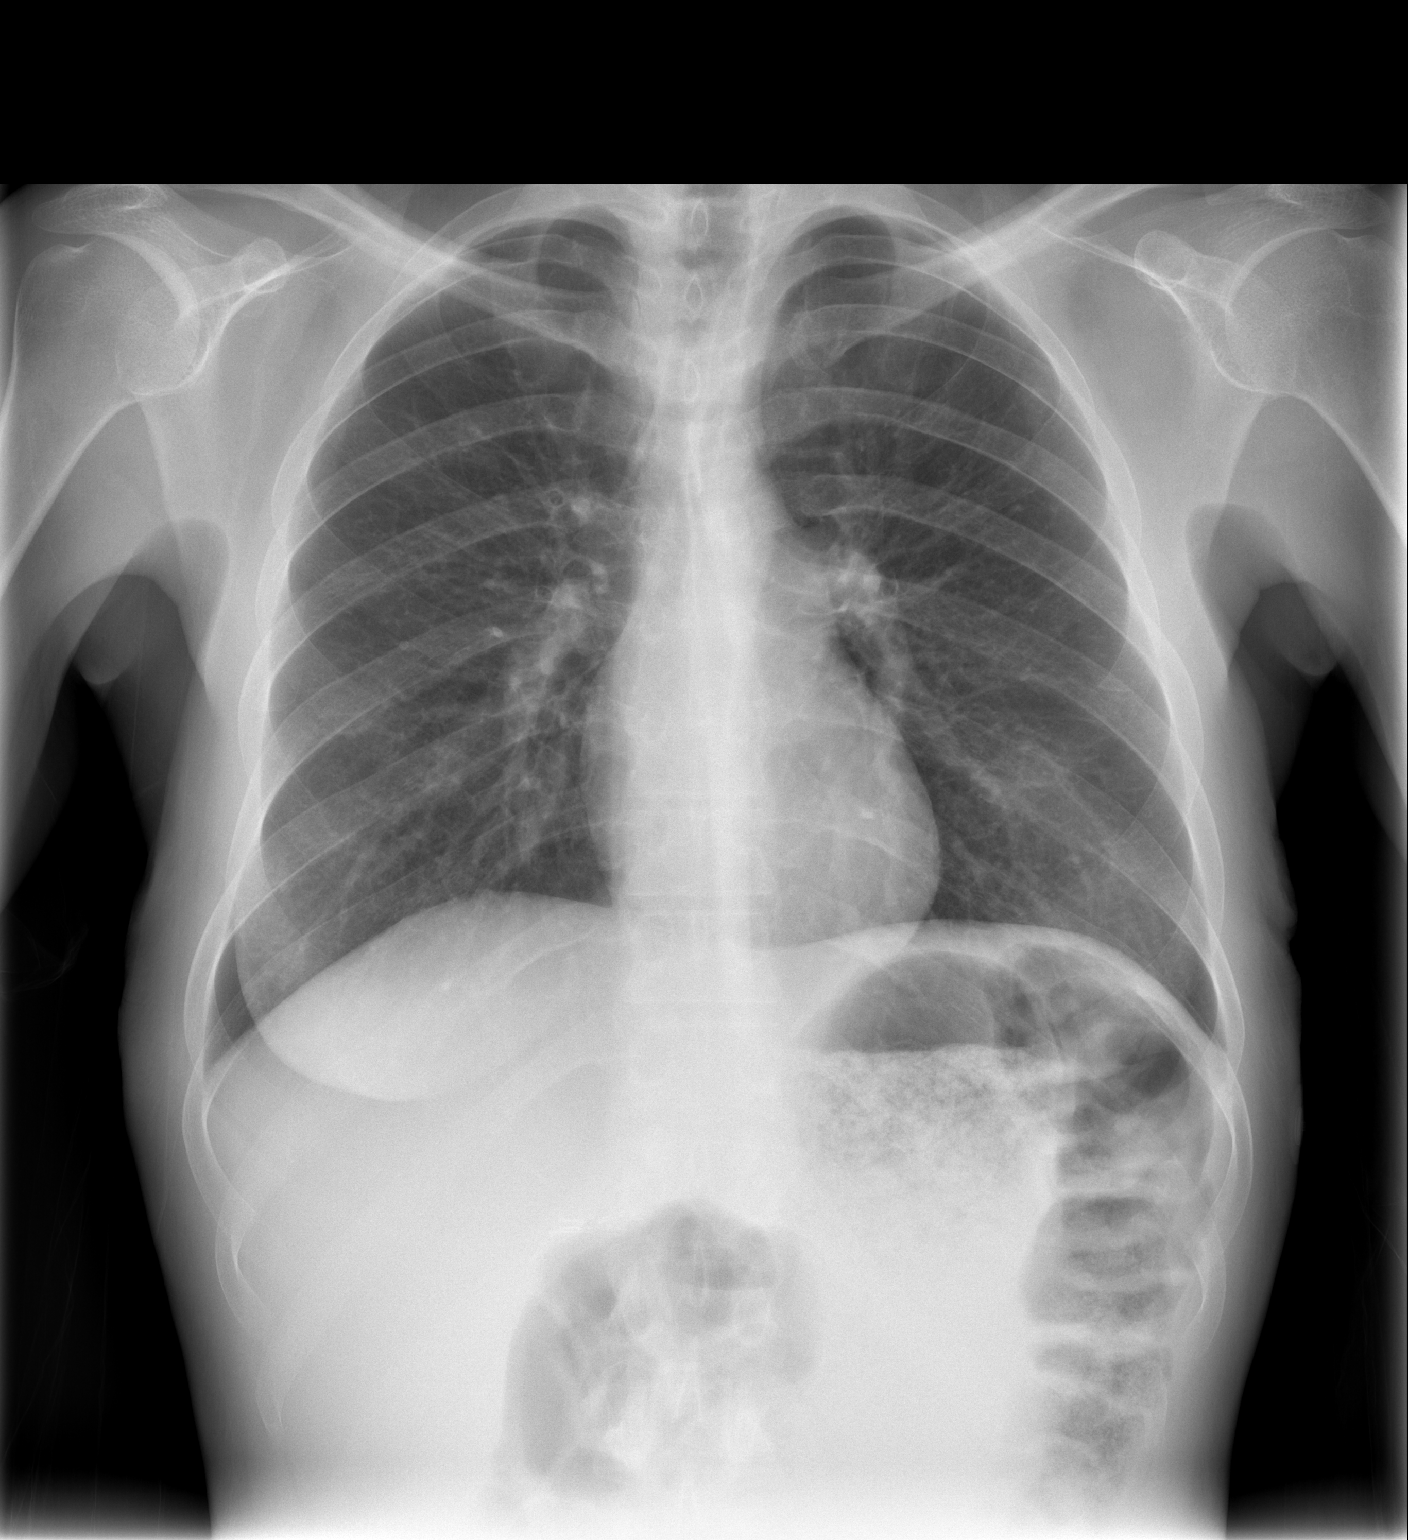

[w chest lat]
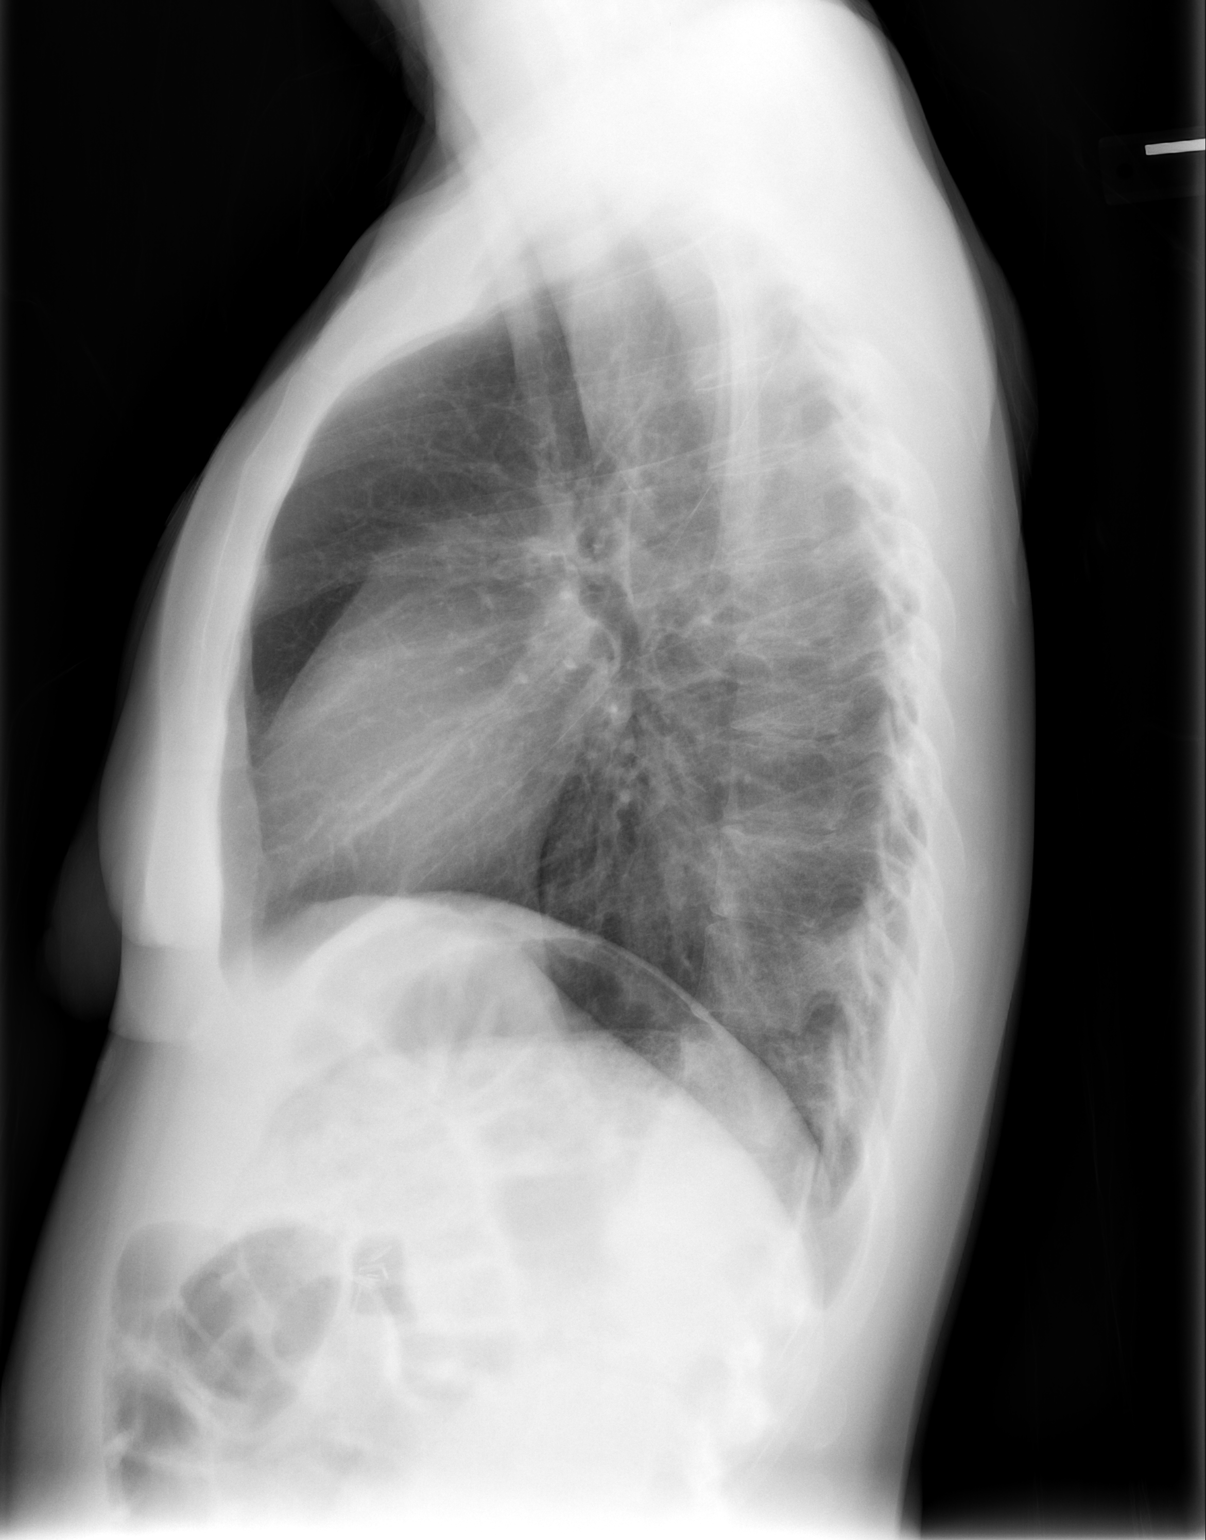

[2 of 2 positions shown; findings below may reference images not displayed]

FINDINGS: The heart size and mediastinal contours are within normal limits.
Both lungs are clear. The visualized skeletal structures are
unremarkable.
IMPRESSION: No active cardiopulmonary disease.

## 2013-05-23 MED ORDER — KETOROLAC TROMETHAMINE 30 MG/ML IJ SOLN
30.0000 mg | Freq: Once | INTRAMUSCULAR | Status: AC
Start: 2013-05-23 — End: 2013-05-23
  Administered 2013-05-23: 30 mg via INTRAVENOUS
  Filled 2013-05-23: qty 1

## 2013-05-23 MED ORDER — IBUPROFEN 600 MG PO TABS: 600.0000 mg | ORAL_TABLET | Freq: Three times a day (TID) | ORAL | Status: DC

## 2013-05-23 MED ORDER — SODIUM CHLORIDE 0.9 % IV BOLUS (SEPSIS): 1000.0000 mL | Freq: Once | INTRAVENOUS | Status: DC

## 2013-05-23 NOTE — ED Notes (Signed)
Pt reports she has not taken her insulin today.

## 2013-05-23 NOTE — ED Provider Notes (Signed)
CSN: [id number]     Arrival date & time 05/23/13  1207 History   First MD Initiated Contact with Patient 05/23/13 1735     Chief Complaint  Patient presents with  . Chest Pain      Patient is a 23 y.o. female presenting with chest pain.  Chest Pain Pain location:  Substernal area Pain quality: aching   Pain radiates to:  Does not radiate Pain radiates to the back: no   Pain severity:  Mild Onset quality:  Gradual Duration:  3 weeks Timing:  Constant Progression:  Unchanged Chronicity:  New Context: at rest   Context: not breathing and not lifting   Relieved by:  Nothing Worsened by:  Nothing tried Ineffective treatments:  None tried Associated symptoms: no abdominal pain, no AICD problem, no altered mental status, no back pain, no cough, no fever, no headache, no nausea, no palpitations, no shortness of breath, not vomiting and no weakness     22 yoF who has been seen here 19 times in the last couple months and most recently seen here yesterday for CP of 2-3 weeks duration. CT PE study yesterday with no PE. Echo on 03/2013 with normal EF. Patient has hx of anxiety and is on cymbalta for this.   Past Medical History  Diagnosis Date  . Diabetes mellitus   . Hypertension   . Diabetes type 1, uncontrolled 11/14/2011    Since age 23  . Gastroparesis    Past Surgical History  Procedure Laterality Date  . Ankle surgery    . Cholecystectomy  11/15/2011    Procedure: LAPAROSCOPIC CHOLECYSTECTOMY WITH INTRAOPERATIVE CHOLANGIOGRAM;  Surgeon: Ardeth SportsmanSteven C. Gross, MD;  Location: [facility];  Service: General;  Laterality: N/A;  . Laparoscopy  11/23/2011    Procedure: LAPAROSCOPY DIAGNOSTIC;  Surgeon: Mariella SaaBenjamin T Hoxworth, MD;  Location: [facility];  Service: General;  Laterality: N/A;  . Esophagogastroduodenoscopy  12/03/2011    Procedure: ESOPHAGOGASTRODUODENOSCOPY (EGD);  Surgeon: Theda BelfastPatrick D Hung, MD;  [facility];  Service: Endoscopy;  Laterality: N/A;   Family History  Problem Relation  Age of Onset  . Diabetes Mother   . Hypertension Father    History  Substance Use Topics  . Smoking status: Never Smoker   . Smokeless tobacco: Never Used  . Alcohol Use: Yes     Comment: Occasional   OB History   Grav Para Term Preterm Abortions TAB SAB Ect Mult Living                 Review of Systems  Constitutional: Negative for fever, chills, activity change and appetite change.  HENT: Negative for congestion, ear pain and rhinorrhea.   Eyes: Negative for pain.  Respiratory: Negative for cough and shortness of breath.   Cardiovascular: Positive for chest pain. Negative for palpitations.  Gastrointestinal: Negative for nausea, vomiting and abdominal pain.  Genitourinary: Negative for dysuria, difficulty urinating and pelvic pain.  Musculoskeletal: Negative for back pain and neck pain.  Skin: Negative for rash and wound.  Neurological: Negative for weakness and headaches.  Psychiatric/Behavioral: Negative for behavioral problems, confusion and agitation.      Allergies  Peanut-containing drug products  Home Medications   Current Outpatient Rx  Name  Route  Sig  Dispense  Refill  . cyclobenzaprine (FLEXERIL) 10 MG tablet   Oral   Take 10 mg by mouth 3 (three) times daily as needed for muscle spasms.         Marland Kitchen. dicyclomine (BENTYL) 10 MG  capsule   Oral   Take 10 mg by mouth 4 (four) times daily as needed for spasms.          . DULoxetine (CYMBALTA) 30 MG capsule   Oral   Take 30 mg by mouth at bedtime.         . enalapril (VASOTEC) 10 MG tablet   Oral   Take 10 mg by mouth every evening.          . gabapentin (NEURONTIN) 400 MG capsule   Oral   Take 2 capsules (800 mg total) by mouth 4 (four) times daily.   240 capsule   5   . insulin glargine (LANTUS) 100 UNIT/ML injection   Subcutaneous   Inject 40 Units into the skin at bedtime.         . insulin lispro (HUMALOG) 100 UNIT/ML injection   Subcutaneous   Inject 5-15 Units into the skin 4  (four) times daily -  before meals and at bedtime. Inject 5 units (base) and additional units according to sliding scale- sliding scale: CBG 70 - 120: 0 uunits: CBG 121 - 150: 2 units; CBG 151 - 200: 3 units; CBG 201 - 250: 5 uunits; CBG 251 - 300: 8 units;CBG 301 - 350: 11 units; CBG 351 - 400: 15 units; CBG > 400 Call MD         . metoCLOPramide (REGLAN) 10 MG tablet   Oral   Take 10 mg by mouth every 6 (six) hours as needed for nausea or vomiting.         . metoprolol tartrate (LOPRESSOR) 25 MG tablet   Oral   Take 0.5 tablets (12.5 mg total) by mouth 2 (two) times daily.   30 tablet   1   . oxyCODONE-acetaminophen (PERCOCET/ROXICET) 5-325 MG per tablet   Oral   Take 1 tablet by mouth every 6 (six) hours as needed for severe pain.   15 tablet   0   . promethazine (PHENERGAN) 25 MG tablet   Oral   Take 25 mg by mouth every 6 (six) hours as needed for nausea or vomiting. For nausea         . tetrahydrozoline 0.05 % ophthalmic solution   Both Eyes   Place 1 drop into both eyes 3 (three) times daily as needed (dry eyes).          BP 128/87  Pulse 109  Temp(Src) 97.6 F (36.4 C) (Oral)  Resp 14  Ht 5\' 7"  (1.702 m)  Wt 120 lb (54.432 kg)  BMI 18.79 kg/m2  SpO2 99%  LMP 05/14/2013 Physical Exam  Constitutional: She is oriented to person, place, and time. She appears well-developed and well-nourished. No distress.  HENT:  Head: Normocephalic and atraumatic.  Nose: Nose normal.  Mouth/Throat: Oropharynx is clear and moist.  Eyes: EOM are normal. Pupils are equal, round, and reactive to light.  Neck: Normal range of motion. Neck supple. No tracheal deviation present.  Cardiovascular: Normal rate, regular rhythm, normal heart sounds and intact distal pulses.   Tachycardia   Pulmonary/Chest: Effort normal and breath sounds normal. She has no rales.  Abdominal: Soft. Bowel sounds are normal. She exhibits no distension. There is no tenderness. There is no rebound and no  guarding.  Musculoskeletal: Normal range of motion. She exhibits no tenderness.  Neurological: She is alert and oriented to person, place, and time.  Skin: Skin is warm and dry. No rash noted.  Psychiatric: She has a normal  mood and affect. Her behavior is normal.    ED Course  Procedures (including critical care time) Labs Review Labs Reviewed  BASIC METABOLIC PANEL - Abnormal; Notable for the following:    Glucose, Bld 459 (*)    BUN 4 (*)    Creatinine, Ser 0.36 (*)    All other components within normal limits  URINALYSIS, DIPSTICK ONLY - Abnormal; Notable for the following:    Specific Gravity, Urine 1.036 (*)    Glucose, UA >1000 (*)    All other components within normal limits  CBG MONITORING, ED - Abnormal; Notable for the following:    Glucose-Capillary 416 (*)    All other components within normal limits  CBC  PREGNANCY, URINE  I-STAT TROPOININ, ED  CBG MONITORING, ED   Imaging Review Dg Chest 2 View  05/23/2013   CLINICAL DATA:  CHEST PAIN  EXAM: CHEST  2 VIEW  COMPARISON:  CT ANGIO CHEST W/CM &/OR WO/CM dated 05/22/2013; DG CHEST 2 VIEW dated 05/09/2013  FINDINGS: The heart size and mediastinal contours are within normal limits. Both lungs are clear. The visualized skeletal structures are unremarkable.  IMPRESSION: No active cardiopulmonary disease.   Electronically Signed   By: Salome Holmes M.D.   On: 05/23/2013 13:05   Ct Angio Chest Pe W/cm &/or Wo Cm  05/22/2013   CLINICAL DATA:  Multiple episodes of chest pain in chest tightness with multiple ED visits, history diabetes, hypertension  EXAM: CT ANGIOGRAPHY CHEST WITH CONTRAST  TECHNIQUE: Multidetector CT imaging of the chest was performed using the standard protocol during bolus administration of intravenous contrast. Multiplanar CT image reconstructions and MIPs were obtained to evaluate the vascular anatomy.  CONTRAST:  OMNIPAQUE IOHEXOL 350 MG/ML SOLN  COMPARISON:  02/28/2012  FINDINGS: Aorta normal caliber  without aneurysm or dissection.  Residual thymic tissue in anterior mediastinum.  No thoracic adenopathy.  Visualized portion of upper abdomen normal appearance.  Pulmonary arteries well opacified and patent.  No evidence of pulmonary embolism.  Lungs clear.  No infiltrate, pleural effusion, pneumothorax or mass/nodule.  Bones unremarkable.  Review of the MIP images confirms the above findings.  IMPRESSION: Normal CTA chest.   Electronically Signed   By: Ulyses Southward M.D.   On: 05/22/2013 11:33    EKG Interpretation    Date/Time:  Monday May 23 2013 12:13:49 EST Ventricular Rate:  134 PR Interval:    QRS Duration: 78 QT Interval:  386 QTC Calculation: 576 R Axis:   66 Text Interpretation:  Sinus tachycardia  Critical Test Result: Long QTc Otherwise normal ECG When compared with ECG of 05/21/2013, QT has lengthened Confirmed by Preston Fleeting  MD, DAVID ([id number]) on 05/23/2013 6:36:05 PM            MDM   Final diagnoses:  Hyperglycemia  Chest pain   23 yo F in NAD AFVSS non toxic appearing who presents with atypical CP. Multiple visits for same presentation. Seen yesterday for same and had a neg PE study. CP improved today with Toradol. Bedside US with no pericardial effusion. EKG with no ischemia but QTC of 576. Case co managed with my attending Dr. Preston Fleeting. Patient has an appointment with cardiology. Doubt ACS or PE. Thorough discussion with patient on plan, findings, return precautions. Strong return precautions given. Will follow up with cardiology and/or return earlier to the ED if any concerns or worsening of the pain.     Nadara Mustard, MD 05/24/13 [phone]

## 2013-05-23 NOTE — ED Notes (Signed)
Pt reports central CP x 3 days also body aches for same. Reports nausea, feels sob. Skin is warm and dry. Pt is a x 4. In NAD.

## 2013-05-23 NOTE — Discharge Instructions (Signed)
Chest Pain (Nonspecific) Chest pain has many causes. Your pain could be caused by something serious, such as a heart attack or a blood clot in the lungs. It could also be caused by something less serious, such as a chest bruise or a virus. Follow up with your doctor. More lab tests or other studies may be needed to find the cause of your pain. Most of the time, nonspecific chest pain will improve within 2 to 3 days of rest and mild pain medicine. HOME CARE  For chest bruises, you may put ice on the sore area for 15-20 minutes, 03-04 times a day. Do this only if it makes you feel better.  Put ice in a plastic bag.  Place a towel between the skin and the bag.  Rest for the next 2 to 3 days.  Go back to work if the pain improves.  See your doctor if the pain lasts longer than 1 to 2 weeks.  Only take medicine as told by your doctor.  Quit smoking if you smoke. GET HELP RIGHT AWAY IF:   There is more pain or pain that spreads to the arm, neck, jaw, back, or belly (abdomen).  You have shortness of breath.  You cough more than usual or cough up blood.  You have very bad back or belly pain, feel sick to your stomach (nauseous), or throw up (vomit).  You have very bad weakness.  You pass out (faint).  You have a fever. Any of these problems may be serious and may be an emergency. Do not wait to see if the problems will go away. Get medical help right away. Call your local emergency services 911 in [place] Do not drive yourself to the hospital. MAKE SURE YOU:   Understand these instructions.  Will watch this condition.  Will get help right away if you or your child is not doing well or gets worse. Document Released: 09/03/2007 Document Revised: 06/09/2011 Document Reviewed: 09/03/2007 [facility] Patient Information 2014 [place], [place].  Glucose, Blood Sugar, Fasting Blood Sugar This is a test to measure your blood sugar. Glucose is a simple sugar that serves as the main source of  energy for the body. The carbohydrates we eat are broken down into glucose (and a few other simple sugars), absorbed by the small intestine, and circulated throughout the body. Most of the body's cells require glucose for energy production; brain and nervous system cells not only rely on glucose for energy, they can only function when glucose levels in the blood remain above a certain level.  The body's use of glucose hinges on the availability of insulin, a hormone produced by the pancreas. Insulin acts as a Engineer, maintenance, transporting glucose into the body's cells, directing the body to store excess glucose as glycogen (for short-term storage) and/or as triglycerides in fat cells. We can not live without glucose or insulin, and they must be in balance.  Normally, blood glucose levels rise slightly after a meal, and insulin is secreted to lower them, with the amount of insulin released matched up with the size and content of the meal. If blood glucose levels drop too low, such as might occur in between meals or after a strenuous workout, glucagon (another pancreatic hormone) is secreted to tell the liver to turn some glycogen back into glucose, raising the blood glucose levels. If the glucose/insulin feedback mechanism is working properly, the amount of glucose in the blood remains fairly stable. If the balance is disrupted and glucose  levels in the blood rise, then the body tries to restore the balance, both by increasing insulin production and by excreting glucose in the urine.  PREPARATION FOR TEST A blood sample drawn from a vein in your arm or, for a self check, a drop of blood from a skin prick; in general, it may be recommended that you fast before having a blood glucose test; sometimes a random (no preparation) urine sample is used. Your caregiver will instruct you as to what they want prior to your testing. NORMAL FINDINGS Normal values depend on many factors. Your lab will provide a range of  normal values with your test results. The following information summarizes the meaning of the test results. These are based on the clinical practice recommendations of the American Diabetes Association.  FASTING BLOOD GLUCOSE  From 70 to 99 mg/dL (3.9 to 5.5 mmol/L): Normal glucose tolerance  From 100 to 125 mg/dL (5.6 to 6.9 mmol/L):Impaired fasting glucose (pre-diabetes)  126 mg/dL (7.0 mmol/L) and above on more than one testing occasion: Diabetes ORAL GLUCOSE TOLERANCE TEST (OGTT) [EXCEPT PREGNANCY] (2 HOURS AFTER A 75-GRAM GLUCOSE DRINK)  Less than 140 mg/dL (7.8 mmol/L): Normal glucose tolerance  From 140 to 200 mg/dL (7.8 to 00.8 mmol/L): Impaired glucose tolerance (pre-diabetes)  Over 200 mg/dL (67.6 mmol/L) on more than one testing occasion: Diabetes GESTATIONAL DIABETES SCREENING: GLUCOSE CHALLENGE TEST (1 HOUR AFTER A 50-GRAM GLUCOSE DRINK)  Less than 140* mg/dL (7.8 mmol/L): Normal glucose tolerance  140* mg/dL (7.8 mmol/L) and over: Abnormal, needs OGTT (see below) * Some use a cutoff of More Than 130 mg/dL (7.2 mmol/L) because that identifies 90% of women with gestational diabetes, compared to 80% identified using the threshold of More Than 140 mg/dL (7.8 mmol/L). GESTATIONAL DIABETES DIAGNOSTIC: OGTT (100-GRAM GLUCOSE DRINK)  Fasting*..........................................95 mg/dL (5.3 mmol/L)  1 hour after glucose load*..............180 mg/dL (19.5 mmol/L)  2 hours after glucose load*.............155 mg/dL (8.6 mmol/L)  3 hours after glucose load* **.........140 mg/dL (7.8 mmol/L) * If two or more values are above the criteria, gestational diabetes is diagnosed. ** A 75-gram glucose load may be used, although this method is not as well validated as the 100-gram OGTT; the 3-hour sample is not drawn if 75 grams is used.  Ranges for normal findings may vary among different laboratories and hospitals. You should always check with your doctor after having lab work or  other tests done to discuss the meaning of your test results and whether your values are considered within normal limits. MEANING OF TEST  Your caregiver will go over the test results with you and discuss the importance and meaning of your results, as well as treatment options and the need for additional tests if necessary. OBTAINING THE TEST RESULTS It is your responsibility to obtain your test results. Ask the lab or department performing the test when and how you will get your results. Document Released: 04/18/2004 Document Revised: 06/09/2011 Document Reviewed: 02/26/2008 [facility] Patient Information 2014 [place], [place].

## 2013-05-23 NOTE — ED Notes (Signed)
Pt complains of generalized body aches for about three days, she was seen at [facility] and received tramadol after 6 hours of waiting. Pt continues to complain of pain

## 2013-05-24 LAB — CBG MONITORING, ED: Glucose-Capillary: 371 mg/dL — ABNORMAL HIGH (ref 70–99)

## 2013-05-24 MED ORDER — LOPERAMIDE HCL 2 MG PO CAPS
2.0000 mg | ORAL_CAPSULE | Freq: Once | ORAL | Status: AC
  Administered 2013-05-24: 2 mg via ORAL
  Filled 2013-05-24: qty 1

## 2013-05-24 MED ORDER — HYDROMORPHONE HCL PF 1 MG/ML IJ SOLN
1.0000 mg | Freq: Once | INTRAMUSCULAR | Status: AC
  Administered 2013-05-24: 1 mg via INTRAVENOUS
  Filled 2013-05-24: qty 1

## 2013-05-24 MED ORDER — SODIUM CHLORIDE 0.9 % IV BOLUS (SEPSIS)
1000.0000 mL | Freq: Once | INTRAVENOUS | Status: AC
  Administered 2013-05-24: 1000 mL via INTRAVENOUS

## 2013-05-24 MED ORDER — KETOROLAC TROMETHAMINE 15 MG/ML IJ SOLN
30.0000 mg | Freq: Once | INTRAMUSCULAR | Status: AC
  Administered 2013-05-24: 30 mg via INTRAVENOUS
  Filled 2013-05-24: qty 2

## 2013-05-24 NOTE — ED Provider Notes (Signed)
Medical screening examination/treatment/procedure(s) were performed by non-physician practitioner and as supervising physician I was immediately available for consultation/collaboration.    Milea Klink, MD 05/24/13 0705 

## 2013-05-24 NOTE — ED Provider Notes (Signed)
CSN: [id number]     Arrival date & time 05/23/13  2309 History   First MD Initiated Contact with Patient 05/24/13 0151     Chief Complaint  Patient presents with  . Generalized Body Aches     (Consider location/radiation/quality/duration/timing/severity/associated sxs/prior Treatment) HPI Comments: Since leaving  emergency room, where she had a full workup.  She has since developed generalized myalgias, and diarrhea.  She, states she feels dehydrated.  She, states she gets dehydrated very easily.  She, states she did take her insulin.  Tonight.  She did eat she has not had any vomiting  The history is provided by the patient.    Past Medical History  Diagnosis Date  . Diabetes mellitus   . Hypertension   . Diabetes type 1, uncontrolled 11/14/2011    Since age 41  . Gastroparesis    Past Surgical History  Procedure Laterality Date  . Ankle surgery    . Cholecystectomy  11/15/2011    Procedure: LAPAROSCOPIC CHOLECYSTECTOMY WITH INTRAOPERATIVE CHOLANGIOGRAM;  Surgeon: [name], MD;  Location: [facility];  Service: General;  Laterality: N/A;  . Laparoscopy  11/23/2011    Procedure: LAPAROSCOPY DIAGNOSTIC;  Surgeon: Mariella Saa, MD;  Location: [facility];  Service: General;  Laterality: N/A;  . Esophagogastroduodenoscopy  12/03/2011    Procedure: ESOPHAGOGASTRODUODENOSCOPY (EGD);  Surgeon: Theda Belfast, MD;  Location: [facility];  Service: Endoscopy;  Laterality: N/A;   Family History  Problem Relation Age of Onset  . Diabetes Mother   . Hypertension Father    History  Substance Use Topics  . Smoking status: Never Smoker   . Smokeless tobacco: Never Used  . Alcohol Use: Yes     Comment: Occasional   OB History   Grav Para Term Preterm Abortions TAB SAB Ect Mult Living                 Review of Systems  Constitutional: Negative for fever.  Gastrointestinal: Positive for diarrhea. Negative for nausea.  Genitourinary: Negative for dysuria.   Musculoskeletal: Positive for arthralgias and myalgias.  Skin: Positive for pallor. Negative for rash.  All other systems reviewed and are negative.      Allergies  Peanut-containing drug products  Home Medications   Current Outpatient Rx  Name  Route  Sig  Dispense  Refill  . cyclobenzaprine (FLEXERIL) 10 MG tablet   Oral   Take 10 mg by mouth 3 (three) times daily as needed for muscle spasms.         Marland Kitchen dicyclomine (BENTYL) 10 MG capsule   Oral   Take 10 mg by mouth 4 (four) times daily as needed for spasms.          . DULoxetine (CYMBALTA) 30 MG capsule   Oral   Take 30 mg by mouth at bedtime.         . enalapril (VASOTEC) 10 MG tablet   Oral   Take 10 mg by mouth every evening.          . gabapentin (NEURONTIN) 400 MG capsule   Oral   Take 2 capsules (800 mg total) by mouth 4 (four) times daily.   240 capsule   5   . ibuprofen (ADVIL,MOTRIN) 600 MG tablet   Oral   Take 1 tablet (600 mg total) by mouth every 8 (eight) hours.   21 tablet   0   . insulin glargine (LANTUS) 100 UNIT/ML injection   Subcutaneous   Inject 40  Units into the skin at bedtime.         . insulin lispro (HUMALOG) 100 UNIT/ML injection   Subcutaneous   Inject 5-15 Units into the skin 4 (four) times daily -  before meals and at bedtime. Inject 5 units (base) and additional units according to sliding scale- sliding scale: CBG 70 - 120: 0 uunits: CBG 121 - 150: 2 units; CBG 151 - 200: 3 units; CBG 201 - 250: 5 uunits; CBG 251 - 300: 8 units;CBG 301 - 350: 11 units; CBG 351 - 400: 15 units; CBG > 400 Call MD         . metoCLOPramide (REGLAN) 10 MG tablet   Oral   Take 10 mg by mouth every 6 (six) hours as needed for nausea or vomiting.         . metoprolol tartrate (LOPRESSOR) 25 MG tablet   Oral   Take 0.5 tablets (12.5 mg total) by mouth 2 (two) times daily.   30 tablet   1   . oxyCODONE-acetaminophen (PERCOCET/ROXICET) 5-325 MG per tablet   Oral   Take 1 tablet by  mouth every 6 (six) hours as needed for severe pain.   15 tablet   0   . promethazine (PHENERGAN) 25 MG tablet   Oral   Take 25 mg by mouth every 6 (six) hours as needed for nausea or vomiting. For nausea         . tetrahydrozoline 0.05 % ophthalmic solution   Both Eyes   Place 1 drop into both eyes 3 (three) times daily as needed (dry eyes).          BP 143/96  Pulse 125  Temp(Src) 98.2 F (36.8 C) (Oral)  Resp 20  SpO2 100%  LMP 05/14/2013 Physical Exam  Nursing note and vitals reviewed. Constitutional: She appears well-developed and well-nourished.  HENT:  Head: Normocephalic.  Eyes: Pupils are equal, round, and reactive to light.  Neck: Normal range of motion.  Cardiovascular: Normal rate and regular rhythm.   Pulmonary/Chest: Effort normal and breath sounds normal.  Abdominal: Soft.  Musculoskeletal: Normal range of motion.  Neurological: She is alert.  Skin: Skin is warm and dry. No rash noted. There is pallor.    ED Course  Procedures (including critical care time) Labs Review Labs Reviewed  CBG MONITORING, ED - Abnormal; Notable for the following:    Glucose-Capillary 371 (*)    All other components within normal limits   Imaging Review Dg Chest 2 View  05/23/2013   CLINICAL DATA:  CHEST PAIN  EXAM: CHEST  2 VIEW  COMPARISON:  CT ANGIO CHEST W/CM &/OR WO/CM dated 05/22/2013; DG CHEST 2 VIEW dated 05/09/2013  FINDINGS: The heart size and mediastinal contours are within normal limits. Both lungs are clear. The visualized skeletal structures are unremarkable.  IMPRESSION: No active cardiopulmonary disease.   Electronically Signed   By: Salome Holmes M.D.   On: 05/23/2013 13:05   Ct Angio Chest Pe W/cm &/or Wo Cm  05/22/2013   CLINICAL DATA:  Multiple episodes of chest pain in chest tightness with multiple ED visits, history diabetes, hypertension  EXAM: CT ANGIOGRAPHY CHEST WITH CONTRAST  TECHNIQUE: Multidetector CT imaging of the chest was performed using the  standard protocol during bolus administration of intravenous contrast. Multiplanar CT image reconstructions and MIPs were obtained to evaluate the vascular anatomy.  CONTRAST:  OMNIPAQUE IOHEXOL 350 MG/ML SOLN  COMPARISON:  02/28/2012  FINDINGS: Aorta normal caliber without aneurysm  or dissection.  Residual thymic tissue in anterior mediastinum.  No thoracic adenopathy.  Visualized portion of upper abdomen normal appearance.  Pulmonary arteries well opacified and patent.  No evidence of pulmonary embolism.  Lungs clear.  No infiltrate, pleural effusion, pneumothorax or mass/nodule.  Bones unremarkable.  Review of the MIP images confirms the above findings.  IMPRESSION: Normal CTA chest.   Electronically Signed   By: Ulyses SouthwardMark  Boles M.D.   On: 05/22/2013 11:33    EKG Interpretation    Date/Time:  Monday May 23 2013 23:28:49 EST Ventricular Rate:  122 PR Interval:  141 QRS Duration: 72 QT Interval:  328 QTC Calculation: 467 R Axis:   68 Text Interpretation:  Sinus tachycardia No significant change since last tracing Confirmed by GOLDSTON  MD, SCOTT ([id number]) on 05/23/2013 11:32:05 PM            MDM  Will hydrate patient check repeat labs, give Imodium for her diarrhea, and IV, to oral.  For her myalgias Final diagnoses:  Chronic pain  Hyperglycemia         Arman FilterGail K Josslin Sanjuan, NP 05/24/13 [phone]

## 2013-05-24 NOTE — ED Provider Notes (Signed)
23 year old female comes in for chest pain. She has had multiple ED visits for same. Exam is unremarkable. She has a cardiology appointment later this week and is encouraged to keep that appointment.  I saw and evaluated the patient, reviewed the resident's note and I agree with the findings and plan.  EKG Interpretation    Date/Time:  Monday May 23 2013 12:13:49 EST Ventricular Rate:  134 PR Interval:    QRS Duration: 78 QT Interval:  386 QTC Calculation: 576 R Axis:   66 Text Interpretation:  Sinus tachycardia - Critical Test Result: Long QTc Otherwise normal ECG When compared with ECG of 05/21/2013, QT has lengthened Confirmed by Roxanne Mins  MD, Keryl Gholson ([id number]) on 05/23/2013 6:36:05 PM              Delora Fuel, MD 35/68/61 [id number]

## 2013-05-24 NOTE — Discharge Instructions (Signed)
Keep the appointment with her cardiologist, as scheduled on the 10th.  Followup with your primary care physician for persistent pain issues

## 2013-05-25 ENCOUNTER — Emergency Department (HOSPITAL_COMMUNITY)
Admission: EM | Admit: 2013-05-25 | Discharge: 2013-05-25 | Disposition: A | Discharge status: Home / Self Care | Payer: 59 | Attending: Emergency Medicine | Admitting: Emergency Medicine

## 2013-05-25 ENCOUNTER — Ambulatory Visit: Payer: 59

## 2013-05-25 ENCOUNTER — Other Ambulatory Visit: Payer: Self-pay

## 2013-05-25 ENCOUNTER — Encounter (HOSPITAL_COMMUNITY): Payer: Self-pay | Admitting: Emergency Medicine

## 2013-05-25 DIAGNOSIS — IMO0002 Reserved for concepts with insufficient information to code with codable children: Secondary | ICD-10-CM | POA: Insufficient documentation

## 2013-05-25 DIAGNOSIS — R42 Dizziness and giddiness: Secondary | ICD-10-CM | POA: Insufficient documentation

## 2013-05-25 DIAGNOSIS — Z3202 Encounter for pregnancy test, result negative: Secondary | ICD-10-CM | POA: Insufficient documentation

## 2013-05-25 DIAGNOSIS — I10 Essential (primary) hypertension: Secondary | ICD-10-CM | POA: Insufficient documentation

## 2013-05-25 DIAGNOSIS — Z794 Long term (current) use of insulin: Secondary | ICD-10-CM | POA: Insufficient documentation

## 2013-05-25 DIAGNOSIS — R52 Pain, unspecified: Secondary | ICD-10-CM

## 2013-05-25 DIAGNOSIS — I951 Orthostatic hypotension: Secondary | ICD-10-CM

## 2013-05-25 DIAGNOSIS — Z79899 Other long term (current) drug therapy: Secondary | ICD-10-CM | POA: Insufficient documentation

## 2013-05-25 DIAGNOSIS — R197 Diarrhea, unspecified: Secondary | ICD-10-CM | POA: Insufficient documentation

## 2013-05-25 DIAGNOSIS — K3184 Gastroparesis: Secondary | ICD-10-CM | POA: Insufficient documentation

## 2013-05-25 DIAGNOSIS — E1065 Type 1 diabetes mellitus with hyperglycemia: Secondary | ICD-10-CM | POA: Insufficient documentation

## 2013-05-25 LAB — CBG MONITORING, ED
Glucose-Capillary: 98 mg/dL (ref 70–99)
Glucose-Capillary: 46 mg/dL — ABNORMAL LOW (ref 70–99)
Glucose-Capillary: 68 mg/dL — ABNORMAL LOW (ref 70–99)
Glucose-Capillary: 124 mg/dL — ABNORMAL HIGH (ref 70–99)

## 2013-05-25 LAB — URINALYSIS, ROUTINE W REFLEX MICROSCOPIC
Bilirubin Urine: NEGATIVE
Glucose, UA: >1000 mg/dL — AB
Hgb urine dipstick: NEGATIVE
Ketones, ur: NEGATIVE mg/dL
Leukocytes, UA: NEGATIVE
Nitrite: NEGATIVE
Protein, ur: NEGATIVE mg/dL
Specific Gravity, Urine: 1.031 — ABNORMAL HIGH (ref 1.005–1.030)
Urobilinogen, UA: 0.2 mg/dL (ref 0.0–1.0)
pH: 7.5 (ref 5.0–8.0)

## 2013-05-25 LAB — BASIC METABOLIC PANEL
BUN: 8 mg/dL (ref 6–23)
CO2: 28 mEq/L (ref 19–32)
Calcium: 10 mg/dL (ref 8.4–10.5)
Chloride: 101 mEq/L (ref 96–112)
Creatinine, Ser: 0.42 mg/dL — ABNORMAL LOW (ref 0.50–1.10)
GFR calc Af Amer: >90 mL/min (ref >90)
GFR calc non Af Amer: >90 mL/min (ref >90)
Glucose, Bld: 45 mg/dL — ABNORMAL LOW (ref 70–99)
Potassium: 3.6 mEq/L — ABNORMAL LOW (ref 3.7–5.3)
Sodium: 142 mEq/L (ref 137–147)

## 2013-05-25 LAB — CBC WITH DIFFERENTIAL/PLATELET
Basophils Absolute: 0 10*3/uL (ref 0.0–0.1)
Basophils Relative: 0 % (ref 0–1)
Eosinophils Absolute: 0.1 10*3/uL (ref 0.0–0.7)
Eosinophils Relative: 2 % (ref 0–5)
HCT: 37.3 % (ref 36.0–46.0)
Hemoglobin: 13.1 g/dL (ref 12.0–15.0)
Lymphocytes Relative: 46 % (ref 12–46)
Lymphs Abs: 2.5 10*3/uL (ref 0.7–4.0)
MCH: 30.5 pg (ref 26.0–34.0)
MCHC: 35.1 g/dL (ref 30.0–36.0)
MCV: 86.9 fL (ref 78.0–100.0)
Monocytes Absolute: 0.4 10*3/uL (ref 0.1–1.0)
Monocytes Relative: 7 % (ref 3–12)
Neutro Abs: 2.4 10*3/uL (ref 1.7–7.7)
Neutrophils Relative %: 44 % (ref 43–77)
Platelets: 255 10*3/uL (ref 150–400)
RBC: 4.29 MIL/uL (ref 3.87–5.11)
RDW: 11.6 % (ref 11.5–15.5)
WBC: 5.3 10*3/uL (ref 4.0–10.5)

## 2013-05-25 LAB — URINE MICROSCOPIC-ADD ON: Urine-Other: NONE SEEN

## 2013-05-25 LAB — POC URINE PREG, ED: Preg Test, Ur: NEGATIVE

## 2013-05-25 MED ORDER — SODIUM CHLORIDE 0.9 % IV BOLUS (SEPSIS)
1000.0000 mL | Freq: Once | INTRAVENOUS | Status: AC
  Administered 2013-05-25: 1000 mL via INTRAVENOUS

## 2013-05-25 MED ORDER — POTASSIUM CHLORIDE CRYS ER 20 MEQ PO TBCR
40.0000 meq | EXTENDED_RELEASE_TABLET | Freq: Once | ORAL | Status: AC
  Administered 2013-05-25: 40 meq via ORAL
  Filled 2013-05-25: qty 2

## 2013-05-25 NOTE — ED Notes (Signed)
Pt states she thought her sugar was low- HX of hypoglycemia cbg performed

## 2013-05-25 NOTE — ED Provider Notes (Signed)
CSN: [id number]     Arrival date & time 05/25/13  2563 History   First MD Initiated Contact with Patient 05/25/13 [phone]     Chief Complaint  Patient presents with  . Generalized Body Aches     (Consider location/radiation/quality/duration/timing/severity/associated sxs/prior Treatment) Patient is a 23 y.o. female presenting with dizziness.  Dizziness Quality:  Lightheadedness Severity:  Moderate Onset quality:  Sudden Duration: this morning. Timing:  Intermittent Progression:  Unchanged Chronicity:  New Context: standing up   Relieved by:  Nothing Worsened by:  Nothing tried Associated symptoms: diarrhea (started yesterday, watery)   Associated symptoms: no chest pain, no nausea and no shortness of breath     Past Medical History  Diagnosis Date  . Diabetes mellitus   . Hypertension   . Diabetes type 1, uncontrolled 11/14/2011    Since age 85  . Gastroparesis    Past Surgical History  Procedure Laterality Date  . Ankle surgery    . Cholecystectomy  11/15/2011    Procedure: LAPAROSCOPIC CHOLECYSTECTOMY WITH INTRAOPERATIVE CHOLANGIOGRAM;  Surgeon: [name], MD;  Location: [facility];  Service: General;  Laterality: N/A;  . Laparoscopy  11/23/2011    Procedure: LAPAROSCOPY DIAGNOSTIC;  Surgeon: Mariella Saa, MD;  Location: [facility];  Service: General;  Laterality: N/A;  . Esophagogastroduodenoscopy  12/03/2011    Procedure: ESOPHAGOGASTRODUODENOSCOPY (EGD);  Surgeon: Theda Belfast, MD;  Location: [facility];  Service: Endoscopy;  Laterality: N/A;   Family History  Problem Relation Age of Onset  . Diabetes Mother   . Hypertension Father    History  Substance Use Topics  . Smoking status: Never Smoker   . Smokeless tobacco: Never Used  . Alcohol Use: Yes     Comment: Occasional   OB History   Grav Para Term Preterm Abortions TAB SAB Ect Mult Living                 Review of Systems  Respiratory: Negative for shortness of breath.   Cardiovascular:  Negative for chest pain.  Gastrointestinal: Positive for diarrhea (started yesterday, watery). Negative for nausea.  Neurological: Positive for dizziness.  All other systems reviewed and are negative.      Allergies  Peanut-containing drug products  Home Medications   Current Outpatient Rx  Name  Route  Sig  Dispense  Refill  . cyclobenzaprine (FLEXERIL) 10 MG tablet   Oral   Take 10 mg by mouth 3 (three) times daily as needed for muscle spasms.         . DULoxetine (CYMBALTA) 30 MG capsule   Oral   Take 30 mg by mouth at bedtime.         . enalapril (VASOTEC) 10 MG tablet   Oral   Take 10 mg by mouth every evening.          . gabapentin (NEURONTIN) 400 MG capsule   Oral   Take 2 capsules (800 mg total) by mouth 4 (four) times daily.   240 capsule   5   . insulin glargine (LANTUS) 100 UNIT/ML injection   Subcutaneous   Inject 40 Units into the skin at bedtime.         . insulin lispro (HUMALOG) 100 UNIT/ML injection   Subcutaneous   Inject 5-15 Units into the skin 3 (three) times daily with meals. Inject 5 units (base) and additional units according to sliding scale- sliding scale: CBG 70 - 120: 0 units: CBG 121 - 150: 2 units; CBG 151 -  200: 3 units; CBG 201 - 250: 5 units; CBG 251 - 300: 8 units; CBG 301 - 350: 11 units; CBG 351 - 400: 15 units; CBG > 400 Call MD         . metoCLOPramide (REGLAN) 10 MG tablet   Oral   Take 10 mg by mouth See admin instructions. Takes with each meal only as needed for nausea.         . metoprolol tartrate (LOPRESSOR) 25 MG tablet   Oral   Take 0.5 tablets (12.5 mg total) by mouth 2 (two) times daily.   30 tablet   1   . oxyCODONE-acetaminophen (PERCOCET/ROXICET) 5-325 MG per tablet   Oral   Take 1 tablet by mouth every 6 (six) hours as needed for severe pain.   15 tablet   0   . promethazine (PHENERGAN) 25 MG tablet   Oral   Take 25 mg by mouth every 6 (six) hours as needed for nausea or vomiting. For nausea          . tetrahydrozoline 0.05 % ophthalmic solution   Both Eyes   Place 1 drop into both eyes 3 (three) times daily as needed (dry eyes).          BP 98/62  Pulse 129  Temp(Src) 98.2 F (36.8 C) (Oral)  Resp 18  SpO2 100%  LMP 05/14/2013 Physical Exam  Nursing note and vitals reviewed. Constitutional: She is oriented to person, place, and time. She appears well-developed and well-nourished. No distress.  HENT:  Head: Normocephalic and atraumatic.  Mouth/Throat: Oropharynx is clear and moist.  Eyes: Conjunctivae are normal. Pupils are equal, round, and reactive to light. No scleral icterus.  Neck: Neck supple.  Cardiovascular: Normal rate, regular rhythm, normal heart sounds and intact distal pulses.   No murmur heard. Pulmonary/Chest: Effort normal and breath sounds normal. No stridor. No respiratory distress. She has no rales.  Abdominal: Soft. Bowel sounds are normal. She exhibits no distension. There is no tenderness. There is no rebound and no guarding.  Musculoskeletal: Normal range of motion.  Neurological: She is alert and oriented to person, place, and time.  Skin: Skin is warm and dry. No rash noted.  Psychiatric: She has a normal mood and affect. Her behavior is normal.    ED Course  Procedures (including critical care time) Labs Review Labs Reviewed  URINALYSIS, ROUTINE W REFLEX MICROSCOPIC - Abnormal; Notable for the following:    Specific Gravity, Urine 1.031 (*)    Glucose, UA >1000 (*)    All other components within normal limits  BASIC METABOLIC PANEL - Abnormal; Notable for the following:    Potassium 3.6 (*)    Glucose, Bld 45 (*)    Creatinine, Ser 0.42 (*)    All other components within normal limits  CBG MONITORING, ED - Abnormal; Notable for the following:    Glucose-Capillary 46 (*)    All other components within normal limits  CBG MONITORING, ED - Abnormal; Notable for the following:    Glucose-Capillary 68 (*)    All other components  within normal limits  CBG MONITORING, ED - Abnormal; Notable for the following:    Glucose-Capillary 124 (*)    All other components within normal limits  CBC WITH DIFFERENTIAL  URINE MICROSCOPIC-ADD ON  CBG MONITORING, ED  POC URINE PREG, ED   Imaging Review No results found.  EKG Interpretation   None     EKG - sinus tachy, rate 120, normal axis,  normal intervals, no ST/T changes, compared to prior QTc improved.    MDM   Final diagnoses:  Orthostatic hypotension  Diarrhea  Body aches    23 yo female with history of body aches for past several months which are being worked up by her primary care doctors and various specialists.  She presents today with continued pain as well as new dizziness when standing.  Well appearing, in no distress.  However, her orthostatic vital signs were positive.  Felt to be secondary to her diarrhea for past 2 days (also likely explanation for mild hypokalemia).  Her orthostatics were rechecked after IV fluids and were negative.  Review of her recent prior ED visits show similar tachycardia.  She was also hypoglycemic (she states she has not been eating as much, but taking her insulin.) she tolerated POs and blood sugar responded.  She has follow up with PCP and Cardiology already.      Candyce ChurnJohn David Jamilette Suchocki III, MD 05/25/13 [phone]

## 2013-05-25 NOTE — ED Notes (Signed)
Pt with chronic generalized pain.  Here yesterday for same and pain continues.

## 2013-05-28 ENCOUNTER — Encounter (HOSPITAL_COMMUNITY): Payer: Self-pay | Admitting: Emergency Medicine

## 2013-05-28 ENCOUNTER — Emergency Department (HOSPITAL_COMMUNITY)
Admission: EM | Admit: 2013-05-28 | Discharge: 2013-05-29 | Disposition: A | Discharge status: Home / Self Care | Payer: 59 | Attending: Emergency Medicine | Admitting: Emergency Medicine

## 2013-05-28 DIAGNOSIS — Z3202 Encounter for pregnancy test, result negative: Secondary | ICD-10-CM | POA: Insufficient documentation

## 2013-05-28 DIAGNOSIS — R000 Tachycardia, unspecified: Secondary | ICD-10-CM | POA: Insufficient documentation

## 2013-05-28 DIAGNOSIS — E1065 Type 1 diabetes mellitus with hyperglycemia: Secondary | ICD-10-CM | POA: Insufficient documentation

## 2013-05-28 DIAGNOSIS — R52 Pain, unspecified: Secondary | ICD-10-CM | POA: Insufficient documentation

## 2013-05-28 DIAGNOSIS — Z79899 Other long term (current) drug therapy: Secondary | ICD-10-CM | POA: Insufficient documentation

## 2013-05-28 DIAGNOSIS — R739 Hyperglycemia, unspecified: Secondary | ICD-10-CM

## 2013-05-28 DIAGNOSIS — IMO0002 Reserved for concepts with insufficient information to code with codable children: Secondary | ICD-10-CM | POA: Insufficient documentation

## 2013-05-28 DIAGNOSIS — K3184 Gastroparesis: Secondary | ICD-10-CM | POA: Insufficient documentation

## 2013-05-28 DIAGNOSIS — E860 Dehydration: Secondary | ICD-10-CM | POA: Insufficient documentation

## 2013-05-28 DIAGNOSIS — M79609 Pain in unspecified limb: Secondary | ICD-10-CM

## 2013-05-28 DIAGNOSIS — Z794 Long term (current) use of insulin: Secondary | ICD-10-CM | POA: Insufficient documentation

## 2013-05-28 DIAGNOSIS — I10 Essential (primary) hypertension: Secondary | ICD-10-CM | POA: Insufficient documentation

## 2013-05-28 DIAGNOSIS — G8929 Other chronic pain: Secondary | ICD-10-CM | POA: Insufficient documentation

## 2013-05-28 LAB — CBC WITH DIFFERENTIAL/PLATELET
Basophils Absolute: 0 10*3/uL (ref 0.0–0.1)
Basophils Relative: 1 % (ref 0–1)
Eosinophils Absolute: 0.1 10*3/uL (ref 0.0–0.7)
Eosinophils Relative: 2 % (ref 0–5)
HCT: 36.9 % (ref 36.0–46.0)
Hemoglobin: 13.3 g/dL (ref 12.0–15.0)
Lymphocytes Relative: 39 % (ref 12–46)
Lymphs Abs: 2.2 10*3/uL (ref 0.7–4.0)
MCH: 30.7 pg (ref 26.0–34.0)
MCHC: 36 g/dL (ref 30.0–36.0)
MCV: 85.2 fL (ref 78.0–100.0)
Monocytes Absolute: 0.4 10*3/uL (ref 0.1–1.0)
Monocytes Relative: 6 % (ref 3–12)
Neutro Abs: 3 10*3/uL (ref 1.7–7.7)
Neutrophils Relative %: 53 % (ref 43–77)
Platelets: 236 10*3/uL (ref 150–400)
RBC: 4.33 MIL/uL (ref 3.87–5.11)
RDW: 11.5 % (ref 11.5–15.5)
WBC: 5.8 10*3/uL (ref 4.0–10.5)

## 2013-05-28 LAB — COMPREHENSIVE METABOLIC PANEL
ALT: 49 U/L — ABNORMAL HIGH (ref 0–35)
AST: 24 U/L (ref 0–37)
Albumin: 4.1 g/dL (ref 3.5–5.2)
Alkaline Phosphatase: 123 U/L — ABNORMAL HIGH (ref 39–117)
BUN: 11 mg/dL (ref 6–23)
CO2: 27 mEq/L (ref 19–32)
Calcium: 10.1 mg/dL (ref 8.4–10.5)
Chloride: 90 mEq/L — ABNORMAL LOW (ref 96–112)
Creatinine, Ser: 0.53 mg/dL (ref 0.50–1.10)
GFR calc Af Amer: >90 mL/min (ref >90)
GFR calc non Af Amer: >90 mL/min (ref >90)
Glucose, Bld: 507 mg/dL — ABNORMAL HIGH (ref 70–99)
Potassium: 4.7 mEq/L (ref 3.7–5.3)
Sodium: 133 mEq/L — ABNORMAL LOW (ref 137–147)
Total Bilirubin: 0.3 mg/dL (ref 0.3–1.2)
Total Protein: 7.9 g/dL (ref 6.0–8.3)

## 2013-05-28 LAB — CBG MONITORING, ED: Glucose-Capillary: 473 mg/dL — ABNORMAL HIGH (ref 70–99)

## 2013-05-28 MED ORDER — SODIUM CHLORIDE 0.9 % IV BOLUS (SEPSIS)
1000.0000 mL | Freq: Once | INTRAVENOUS | Status: AC
  Administered 2013-05-28: 1000 mL via INTRAVENOUS

## 2013-05-28 MED ORDER — MORPHINE SULFATE 4 MG/ML IJ SOLN
4.0000 mg | Freq: Once | INTRAMUSCULAR | Status: AC
  Administered 2013-05-28: 4 mg via INTRAVENOUS
  Filled 2013-05-28: qty 1

## 2013-05-28 MED ORDER — INSULIN ASPART 100 UNIT/ML IV SOLN
10.0000 [IU] | Freq: Once | INTRAVENOUS | Status: AC
  Administered 2013-05-28: 10 [IU] via INTRAVENOUS
  Filled 2013-05-28: qty 0.1

## 2013-05-28 NOTE — ED Notes (Addendum)
Pt presents with complaint of bilateral leg pain/cramping, generalized body aches, and hyperglycemia. Pt denies polyuria, however reports increased thirst. Pt is A/O x4, and in NAD.

## 2013-05-28 NOTE — ED Provider Notes (Signed)
CSN: [id number]     Arrival date & time 05/28/13  2238 History   First MD Initiated Contact with Patient 05/28/13 2310     Chief Complaint  Patient presents with  . Hyperglycemia  . Leg Pain  . Generalized Body Aches     (Consider location/radiation/quality/duration/timing/severity/associated sxs/prior Treatment) HPI Patient is a type I diabetic and states she's been taking her insulin as previously prescribed. Several hours before coming into the emergency department she began having bilateral lower leg cramping and generalized bodyaches. She checked her blood sugar and it was well over 500. She states she does translate again and remained above 500. She denies any nausea or vomiting. She denies any chest or abdominal pain. She's had no fever or chills. She denies any urinary symptoms. Past Medical History  Diagnosis Date  . Diabetes mellitus   . Hypertension   . Diabetes type 1, uncontrolled 11/14/2011    Since age 60  . Gastroparesis    Past Surgical History  Procedure Laterality Date  . Ankle surgery    . Cholecystectomy  11/15/2011    Procedure: LAPAROSCOPIC CHOLECYSTECTOMY WITH INTRAOPERATIVE CHOLANGIOGRAM;  Surgeon: [name], MD;  Location: [facility];  Service: General;  Laterality: N/A;  . Laparoscopy  11/23/2011    Procedure: LAPAROSCOPY DIAGNOSTIC;  Surgeon: Mariella Saa, MD;  Location: [facility];  Service: General;  Laterality: N/A;  . Esophagogastroduodenoscopy  12/03/2011    Procedure: ESOPHAGOGASTRODUODENOSCOPY (EGD);  Surgeon: Theda Belfast, MD;  Location: [facility];  Service: Endoscopy;  Laterality: N/A;   Family History  Problem Relation Age of Onset  . Diabetes Mother   . Hypertension Father    History  Substance Use Topics  . Smoking status: Never Smoker   . Smokeless tobacco: Never Used  . Alcohol Use: Yes     Comment: Occasional   OB History   Grav Para Term Preterm Abortions TAB SAB Ect Mult Living                 Review of Systems   Constitutional: Negative for fever and chills.  Respiratory: Negative for cough and shortness of breath.   Cardiovascular: Negative for chest pain and leg swelling.  Gastrointestinal: Negative for nausea, vomiting, abdominal pain and diarrhea.  Genitourinary: Negative for dysuria and frequency.  Musculoskeletal: Positive for myalgias. Negative for back pain, neck pain and neck stiffness.  Skin: Negative for pallor, rash and wound.  Neurological: Negative for dizziness, syncope, weakness, light-headedness, numbness and headaches.  All other systems reviewed and are negative.      Allergies  Peanut-containing drug products  Home Medications   Current Outpatient Rx  Name  Route  Sig  Dispense  Refill  . cyclobenzaprine (FLEXERIL) 10 MG tablet   Oral   Take 10 mg by mouth 3 (three) times daily as needed for muscle spasms.         . DULoxetine (CYMBALTA) 30 MG capsule   Oral   Take 30 mg by mouth at bedtime.         . enalapril (VASOTEC) 10 MG tablet   Oral   Take 10 mg by mouth every evening.          . gabapentin (NEURONTIN) 400 MG capsule   Oral   Take 2 capsules (800 mg total) by mouth 4 (four) times daily.   240 capsule   5   . insulin glargine (LANTUS) 100 UNIT/ML injection   Subcutaneous   Inject 40 Units into the  skin at bedtime.         . insulin lispro (HUMALOG) 100 UNIT/ML injection   Subcutaneous   Inject 5-15 Units into the skin 3 (three) times daily with meals. Inject 5 units (base) and additional units according to sliding scale- sliding scale: CBG 70 - 120: 0 units: CBG 121 - 150: 2 units; CBG 151 - 200: 3 units; CBG 201 - 250: 5 units; CBG 251 - 300: 8 units; CBG 301 - 350: 11 units; CBG 351 - 400: 15 units; CBG > 400 Call MD         . metoCLOPramide (REGLAN) 10 MG tablet   Oral   Take 10 mg by mouth 3 (three) times daily as needed for nausea. Takes with each meal only as needed for nausea.         . metoprolol tartrate (LOPRESSOR) 25 MG  tablet   Oral   Take 0.5 tablets (12.5 mg total) by mouth 2 (two) times daily.   30 tablet   1   . naproxen (NAPROSYN) 250 MG tablet   Oral   Take 1 tablet by mouth 2 (two) times daily as needed for mild pain. pain         . oxyCODONE-acetaminophen (PERCOCET/ROXICET) 5-325 MG per tablet   Oral   Take 1 tablet by mouth every 6 (six) hours as needed for severe pain.   15 tablet   0   . promethazine (PHENERGAN) 25 MG tablet   Oral   Take 25 mg by mouth every 6 (six) hours as needed for nausea or vomiting. For nausea         . tetrahydrozoline 0.05 % ophthalmic solution   Both Eyes   Place 1 drop into both eyes 3 (three) times daily as needed (dry eyes).         . traMADol (ULTRAM) 50 MG tablet   Oral   Take 1 tablet by mouth every 6 (six) hours as needed for moderate pain. pain          BP 111/73  Pulse 134  Temp(Src) 97.9 F (36.6 C) (Oral)  Resp 16  SpO2 97%  LMP 05/14/2013 Physical Exam  Nursing note and vitals reviewed. Constitutional: She is oriented to person, place, and time. She appears well-developed and well-nourished. No distress.  HENT:  Head: Normocephalic and atraumatic.  Mouth/Throat: Oropharynx is clear and moist.  Eyes: EOM are normal. Pupils are equal, round, and reactive to light.  Neck: Normal range of motion. Neck supple.  Cardiovascular: Regular rhythm.   Tachycardia  Pulmonary/Chest: Effort normal and breath sounds normal. No respiratory distress. She has no wheezes. She has no rales. She exhibits no tenderness.  Abdominal: Soft. Bowel sounds are normal. She exhibits no distension and no mass. There is no tenderness. There is no rebound and no guarding.  Musculoskeletal: Normal range of motion. She exhibits tenderness (patient has mild tenderness to palpation over her anterior lower extremities from the knee down to the ankle. She has no calf tenderness or tightness. 2+ dorsalis pedis pulses bilaterally.). She exhibits no edema.   Neurological: She is alert and oriented to person, place, and time.  Patient is alert and oriented x3 with clear, goal oriented speech. Patient has 5/5 motor in all extremities. Sensation is intact to light touch.    Skin: Skin is warm and dry. No rash noted. No erythema.  Psychiatric: She has a normal mood and affect. Her behavior is normal.    ED Course  Procedures (including critical care time) Labs Review Labs Reviewed  CBG MONITORING, ED - Abnormal; Notable for the following:    Glucose-Capillary 473 (*)    All other components within normal limits  CBC WITH DIFFERENTIAL  COMPREHENSIVE METABOLIC PANEL  URINALYSIS, ROUTINE W REFLEX MICROSCOPIC  PREGNANCY, URINE   Imaging Review No results found.   EKG Interpretation None      MDM   Final diagnoses:  None   Upon review of patient's records she's been seen and evaluated multiple times in the emergency department for ongoing, chronic extremity pain. She's received 3 L of normal saline with some resolution of her tachycardia. He appears to Moses sign that she's been present in emergency Department she's had elevated heart rate. Her pain is moderately controlled and discharged home with short supply of narcotic pain medication. Patient been advised to followup with her primary Dr. regarding her elevated blood sugars. Return precautions have been given the patient's voice understanding.     Loren Raceravid Jolee Critcher, MD 05/29/13 [phone]

## 2013-05-29 LAB — URINE MICROSCOPIC-ADD ON

## 2013-05-29 LAB — URINALYSIS, ROUTINE W REFLEX MICROSCOPIC
Bilirubin Urine: NEGATIVE
Glucose, UA: >1000 mg/dL — AB
Hgb urine dipstick: NEGATIVE
Ketones, ur: NEGATIVE mg/dL
Leukocytes, UA: NEGATIVE
Nitrite: NEGATIVE
Protein, ur: NEGATIVE mg/dL
Specific Gravity, Urine: 1.036 — ABNORMAL HIGH (ref 1.005–1.030)
Urobilinogen, UA: 0.2 mg/dL (ref 0.0–1.0)
pH: 6 (ref 5.0–8.0)

## 2013-05-29 LAB — CBG MONITORING, ED: Glucose-Capillary: 313 mg/dL — ABNORMAL HIGH (ref 70–99)

## 2013-05-29 LAB — PREGNANCY, URINE: Preg Test, Ur: NEGATIVE

## 2013-05-29 MED ORDER — MORPHINE SULFATE 4 MG/ML IJ SOLN
4.0000 mg | Freq: Once | INTRAMUSCULAR | Status: AC
  Administered 2013-05-29: 4 mg via INTRAVENOUS
  Filled 2013-05-29: qty 1

## 2013-05-29 MED ORDER — OXYCODONE-ACETAMINOPHEN 5-325 MG PO TABS: 1.0000 | ORAL_TABLET | ORAL | Status: DC | PRN

## 2013-05-29 MED ORDER — SODIUM CHLORIDE 0.9 % IV BOLUS (SEPSIS)
1000.0000 mL | Freq: Once | INTRAVENOUS | Status: AC
Start: 2013-05-29 — End: 2013-05-29
  Administered 2013-05-29: 1000 mL via INTRAVENOUS

## 2013-05-29 MED ORDER — SODIUM CHLORIDE 0.9 % IV BOLUS (SEPSIS)
1000.0000 mL | Freq: Once | INTRAVENOUS | Status: AC
  Administered 2013-05-29: 1000 mL via INTRAVENOUS

## 2013-05-29 NOTE — Discharge Instructions (Signed)
Dehydration, Adult Dehydration is when you lose more fluids from the body than you take in. Vital organs like the kidneys, brain, and heart cannot function without a proper amount of fluids and salt. Any loss of fluids from the body can cause dehydration.  CAUSES   Vomiting.  Diarrhea.  Excessive sweating.  Excessive urine output.  Fever. SYMPTOMS  Mild dehydration  Thirst.  Dry lips.  Slightly dry mouth. Moderate dehydration  Very dry mouth.  Sunken eyes.  Skin does not bounce back quickly when lightly pinched and released.  Dark urine and decreased urine production.  Decreased tear production.  Headache. Severe dehydration  Very dry mouth.  Extreme thirst.  Rapid, weak pulse (more than 100 beats per minute at rest).  Cold hands and feet.  Not able to sweat in spite of heat and temperature.  Rapid breathing.  Blue lips.  Confusion and lethargy.  Difficulty being awakened.  Minimal urine production.  No tears. DIAGNOSIS  Your caregiver will diagnose dehydration based on your symptoms and your exam. Blood and urine tests will help confirm the diagnosis. The diagnostic evaluation should also identify the cause of dehydration. TREATMENT  Treatment of mild or moderate dehydration can often be done at home by increasing the amount of fluids that you drink. It is best to drink small amounts of fluid more often. Drinking too much at one time can make vomiting worse. Refer to the home care instructions below. Severe dehydration needs to be treated at the hospital where you will probably be given intravenous (IV) fluids that contain water and electrolytes. HOME CARE INSTRUCTIONS   Ask your caregiver about specific rehydration instructions.  Drink enough fluids to keep your urine clear or pale yellow.  Drink small amounts frequently if you have nausea and vomiting.  Eat as you normally do.  Avoid:  Foods or drinks high in sugar.  Carbonated  drinks.  Juice.  Extremely hot or cold fluids.  Drinks with caffeine.  Fatty, greasy foods.  Alcohol.  Tobacco.  Overeating.  Gelatin desserts.  Wash your hands well to avoid spreading bacteria and viruses.  Only take over-the-counter or prescription medicines for pain, discomfort, or fever as directed by your caregiver.  Ask your caregiver if you should continue all prescribed and over-the-counter medicines.  Keep all follow-up appointments with your caregiver. SEEK MEDICAL CARE IF:  You have abdominal pain and it increases or stays in one area (localizes).  You have a rash, stiff neck, or severe headache.  You are irritable, sleepy, or difficult to awaken.  You are weak, dizzy, or extremely thirsty. SEEK IMMEDIATE MEDICAL CARE IF:   You are unable to keep fluids down or you get worse despite treatment.  You have frequent episodes of vomiting or diarrhea.  You have blood or green matter (bile) in your vomit.  You have blood in your stool or your stool looks black and tarry.  You have not urinated in 6 to 8 hours, or you have only urinated a small amount of very dark urine.  You have a fever.  You faint. MAKE SURE YOU:   Understand these instructions.  Will watch your condition.  Will get help right away if you are not doing well or get worse. Document Released: 03/17/2005 Document Revised: 06/09/2011 Document Reviewed: 11/04/2010 [facility] Patient Information 2014 [place], [place].  Hyperglycemia Hyperglycemia occurs when the glucose (sugar) in your blood is too high. Hyperglycemia can happen for many reasons, but it most often happens to people who  do not know they have diabetes or are not managing their diabetes properly.  CAUSES  Whether you have diabetes or not, there are other causes of hyperglycemia. Hyperglycemia can occur when you have diabetes, but it can also occur in other situations that you might not be as aware of, such as: Diabetes  If  you have diabetes and are having problems controlling your blood glucose, hyperglycemia could occur because of some of the following reasons:  Not following your meal plan.  Not taking your diabetes medications or not taking it properly.  Exercising less or doing less activity than you normally do.  Being sick. Pre-diabetes  This cannot be ignored. Before people develop Type 2 diabetes, they almost always have "pre-diabetes." This is when your blood glucose levels are higher than normal, but not yet high enough to be diagnosed as diabetes. Research has shown that some long-term damage to the body, especially the heart and circulatory system, may already be occurring during pre-diabetes. If you take action to manage your blood glucose when you have pre-diabetes, you may delay or prevent Type 2 diabetes from developing. Stress  If you have diabetes, you may be "diet" controlled or on oral medications or insulin to control your diabetes. However, you may find that your blood glucose is higher than usual in the hospital whether you have diabetes or not. This is often referred to as "stress hyperglycemia." Stress can elevate your blood glucose. This happens because of hormones put out by the body during times of stress. If stress has been the cause of your high blood glucose, it can be followed regularly by your caregiver. That way he/she can make sure your hyperglycemia does not continue to get worse or progress to diabetes. Steroids  Steroids are medications that act on the infection fighting system (immune system) to block inflammation or infection. One side effect can be a rise in blood glucose. Most people can produce enough extra insulin to allow for this rise, but for those who cannot, steroids make blood glucose levels go even higher. It is not unusual for steroid treatments to "uncover" diabetes that is developing. It is not always possible to determine if the hyperglycemia will go away after  the steroids are stopped. A special blood test called an A1c is sometimes done to determine if your blood glucose was elevated before the steroids were started. SYMPTOMS  Thirsty.  Frequent urination.  Dry mouth.  Blurred vision.  Tired or fatigue.  Weakness.  Sleepy.  Tingling in feet or leg. DIAGNOSIS  Diagnosis is made by monitoring blood glucose in one or all of the following ways:  A1c test. This is a chemical found in your blood.  Fingerstick blood glucose monitoring.  Laboratory results. TREATMENT  First, knowing the cause of the hyperglycemia is important before the hyperglycemia can be treated. Treatment may include, but is not be limited to:  Education.  Change or adjustment in medications.  Change or adjustment in meal plan.  Treatment for an illness, infection, etc.  More frequent blood glucose monitoring.  Change in exercise plan.  Decreasing or stopping steroids.  Lifestyle changes. HOME CARE INSTRUCTIONS   Test your blood glucose as directed.  Exercise regularly. Your caregiver will give you instructions about exercise. Pre-diabetes or diabetes which comes on with stress is helped by exercising.  Eat wholesome, balanced meals. Eat often and at regular, fixed times. Your caregiver or nutritionist will give you a meal plan to guide your sugar intake.  Being  at an ideal weight is important. If needed, losing as little as 10 to 15 pounds may help improve blood glucose levels. SEEK MEDICAL CARE IF:   You have questions about medicine, activity, or diet.  You continue to have symptoms (problems such as increased thirst, urination, or weight gain). SEEK IMMEDIATE MEDICAL CARE IF:   You are vomiting or have diarrhea.  Your breath smells fruity.  You are breathing faster or slower.  You are very sleepy or incoherent.  You have numbness, tingling, or pain in your feet or hands.  You have chest pain.  Your symptoms get worse even though you  have been following your caregiver's orders.  If you have any other questions or concerns. Document Released: 09/10/2000 Document Revised: 06/09/2011 Document Reviewed: 07/14/2011 [facility] Patient Information 2014 [place], [place].  Musculoskeletal Pain Musculoskeletal pain is muscle and boney aches and pains. These pains can occur in any part of the body. Your caregiver may treat you without knowing the cause of the pain. They may treat you if blood or urine tests, X-rays, and other tests were normal.  CAUSES There is often not a definite cause or reason for these pains. These pains may be caused by a type of germ (virus). The discomfort may also come from overuse. Overuse includes working out too hard when your body is not fit. Boney aches also come from weather changes. Bone is sensitive to atmospheric pressure changes. HOME CARE INSTRUCTIONS   Ask when your test results will be ready. Make sure you get your test results.  Only take over-the-counter or prescription medicines for pain, discomfort, or fever as directed by your caregiver. If you were given medications for your condition, do not drive, operate machinery or power tools, or sign legal documents for 24 hours. Do not drink alcohol. Do not take sleeping pills or other medications that may interfere with treatment.  Continue all activities unless the activities cause more pain. When the pain lessens, slowly resume normal activities. Gradually increase the intensity and duration of the activities or exercise.  During periods of severe pain, bed rest may be helpful. Lay or sit in any position that is comfortable.  Putting ice on the injured area.  Put ice in a bag.  Place a towel between your skin and the bag.  Leave the ice on for 15 to 20 minutes, 3 to 4 times a day.  Follow up with your caregiver for continued problems and no reason can be found for the pain. If the pain becomes worse or does not go away, it may be necessary to  repeat tests or do additional testing. Your caregiver may need to look further for a possible cause. SEEK IMMEDIATE MEDICAL CARE IF:  You have pain that is getting worse and is not relieved by medications.  You develop chest pain that is associated with shortness or breath, sweating, feeling sick to your stomach (nauseous), or throw up (vomit).  Your pain becomes localized to the abdomen.  You develop any new symptoms that seem different or that concern you. MAKE SURE YOU:   Understand these instructions.  Will watch your condition.  Will get help right away if you are not doing well or get worse. Document Released: 03/17/2005 Document Revised: 06/09/2011 Document Reviewed: 11/19/2012 [facility] Patient Information 2014 [place], [place].

## 2013-05-31 ENCOUNTER — Encounter (HOSPITAL_COMMUNITY): Payer: Self-pay | Admitting: Emergency Medicine

## 2013-05-31 ENCOUNTER — Emergency Department (HOSPITAL_COMMUNITY)
Admission: EM | Admit: 2013-05-31 | Discharge: 2013-05-31 | Disposition: A | Discharge status: Home / Self Care | Payer: 59 | Attending: Emergency Medicine | Admitting: Emergency Medicine

## 2013-05-31 DIAGNOSIS — Z794 Long term (current) use of insulin: Secondary | ICD-10-CM | POA: Insufficient documentation

## 2013-05-31 DIAGNOSIS — Z79899 Other long term (current) drug therapy: Secondary | ICD-10-CM | POA: Insufficient documentation

## 2013-05-31 DIAGNOSIS — E109 Type 1 diabetes mellitus without complications: Secondary | ICD-10-CM | POA: Insufficient documentation

## 2013-05-31 DIAGNOSIS — I10 Essential (primary) hypertension: Secondary | ICD-10-CM | POA: Insufficient documentation

## 2013-05-31 DIAGNOSIS — Z8719 Personal history of other diseases of the digestive system: Secondary | ICD-10-CM | POA: Insufficient documentation

## 2013-05-31 DIAGNOSIS — R739 Hyperglycemia, unspecified: Secondary | ICD-10-CM

## 2013-05-31 LAB — HCG, SERUM, QUALITATIVE: Preg, Serum: NEGATIVE

## 2013-05-31 LAB — URINALYSIS, ROUTINE W REFLEX MICROSCOPIC
Bilirubin Urine: NEGATIVE
Glucose, UA: >1000 mg/dL — AB
Hgb urine dipstick: NEGATIVE
Ketones, ur: 15 mg/dL — AB
Leukocytes, UA: NEGATIVE
Nitrite: NEGATIVE
Protein, ur: NEGATIVE mg/dL
Specific Gravity, Urine: 1.04 — ABNORMAL HIGH (ref 1.005–1.030)
Urobilinogen, UA: 0.2 mg/dL (ref 0.0–1.0)
pH: 7 (ref 5.0–8.0)

## 2013-05-31 LAB — COMPREHENSIVE METABOLIC PANEL
ALT: 93 U/L — ABNORMAL HIGH (ref 0–35)
AST: 65 U/L — ABNORMAL HIGH (ref 0–37)
Albumin: 4.2 g/dL (ref 3.5–5.2)
Alkaline Phosphatase: 133 U/L — ABNORMAL HIGH (ref 39–117)
BUN: 8 mg/dL (ref 6–23)
CO2: 27 mEq/L (ref 19–32)
Calcium: 10.4 mg/dL (ref 8.4–10.5)
Chloride: 89 mEq/L — ABNORMAL LOW (ref 96–112)
Creatinine, Ser: 0.44 mg/dL — ABNORMAL LOW (ref 0.50–1.10)
GFR calc Af Amer: >90 mL/min (ref >90)
GFR calc non Af Amer: >90 mL/min (ref >90)
Glucose, Bld: 453 mg/dL — ABNORMAL HIGH (ref 70–99)
Potassium: 4 mEq/L (ref 3.7–5.3)
Sodium: 131 mEq/L — ABNORMAL LOW (ref 137–147)
Total Bilirubin: 0.2 mg/dL — ABNORMAL LOW (ref 0.3–1.2)
Total Protein: 8.7 g/dL — ABNORMAL HIGH (ref 6.0–8.3)

## 2013-05-31 LAB — CBC WITH DIFFERENTIAL/PLATELET
Basophils Absolute: 0 10*3/uL (ref 0.0–0.1)
Basophils Relative: 0 % (ref 0–1)
Eosinophils Absolute: 0.1 10*3/uL (ref 0.0–0.7)
Eosinophils Relative: 1 % (ref 0–5)
HCT: 40.6 % (ref 36.0–46.0)
Hemoglobin: 14.4 g/dL (ref 12.0–15.0)
Lymphocytes Relative: 42 % (ref 12–46)
Lymphs Abs: 2.6 10*3/uL (ref 0.7–4.0)
MCH: 30.5 pg (ref 26.0–34.0)
MCHC: 35.5 g/dL (ref 30.0–36.0)
MCV: 86 fL (ref 78.0–100.0)
Monocytes Absolute: 0.3 10*3/uL (ref 0.1–1.0)
Monocytes Relative: 5 % (ref 3–12)
Neutro Abs: 3.2 10*3/uL (ref 1.7–7.7)
Neutrophils Relative %: 51 % (ref 43–77)
Platelets: 244 10*3/uL (ref 150–400)
RBC: 4.72 MIL/uL (ref 3.87–5.11)
RDW: 11.3 % — ABNORMAL LOW (ref 11.5–15.5)
WBC: 6.2 10*3/uL (ref 4.0–10.5)

## 2013-05-31 LAB — CBG MONITORING, ED
Glucose-Capillary: 453 mg/dL — ABNORMAL HIGH (ref 70–99)
Glucose-Capillary: 319 mg/dL — ABNORMAL HIGH (ref 70–99)
Glucose-Capillary: 367 mg/dL — ABNORMAL HIGH (ref 70–99)

## 2013-05-31 LAB — URINE MICROSCOPIC-ADD ON

## 2013-05-31 LAB — KETONES, QUALITATIVE: Acetone, Bld: NEGATIVE

## 2013-05-31 LAB — LIPASE, BLOOD: Lipase: 24 U/L (ref 11–59)

## 2013-05-31 MED ORDER — SODIUM CHLORIDE 0.9 % IV BOLUS (SEPSIS)
1000.0000 mL | Freq: Once | INTRAVENOUS | Status: AC
  Administered 2013-05-31: 1000 mL via INTRAVENOUS

## 2013-05-31 MED ORDER — SODIUM CHLORIDE 0.9 % IV SOLN
Freq: Once | INTRAVENOUS | Status: AC
  Administered 2013-05-31: 18:00:00 via INTRAVENOUS

## 2013-05-31 MED ORDER — INSULIN ASPART 100 UNIT/ML ~~LOC~~ SOLN: 10.0000 [IU] | Freq: Once | SUBCUTANEOUS | Status: DC

## 2013-05-31 MED ORDER — MORPHINE SULFATE 4 MG/ML IJ SOLN
4.0000 mg | INTRAMUSCULAR | Status: DC | PRN
  Administered 2013-05-31 (×2): 4 mg via INTRAVENOUS
  Filled 2013-05-31 (×2): qty 1

## 2013-05-31 MED ORDER — PROMETHAZINE HCL 25 MG/ML IJ SOLN
12.5000 mg | Freq: Once | INTRAMUSCULAR | Status: AC
  Administered 2013-05-31: 12.5 mg via INTRAVENOUS
  Filled 2013-05-31: qty 1

## 2013-05-31 MED ORDER — INSULIN ASPART 100 UNIT/ML ~~LOC~~ SOLN
5.0000 [IU] | Freq: Once | SUBCUTANEOUS | Status: AC
  Administered 2013-05-31: 5 [IU] via SUBCUTANEOUS
  Filled 2013-05-31: qty 1

## 2013-05-31 NOTE — ED Notes (Signed)
Pt reports starting to feel unwell with generalized body aches. Pt reports home CBG in high 400s, verified upon arrival. Pt denies n/v/d. HR 138, no hx of heart problems. Pt has type 1 diabetes.

## 2013-05-31 NOTE — Discharge Instructions (Signed)

## 2013-05-31 NOTE — ED Notes (Signed)
cbg 453

## 2013-05-31 NOTE — ED Notes (Signed)
MD at bedside.  EDP Fayrene Fearing present

## 2013-05-31 NOTE — ED Provider Notes (Signed)
CSN: [id number]     Arrival date & time 05/31/13  1721 History   First MD Initiated Contact with Patient 05/31/13 1734     Chief Complaint  Patient presents with  . Hyperglycemia    ) HPI  Patient presents with the "not feeling well". She states she has felt poorly all day. Generally specify. Had bodyaches. Nausea no vomiting. Blood sugar was high at home. She had this morning. She did not take her insulin. She did not eat. She did not take her Lantus last night because "I forgot".  Abdominal pain. Denies pregnancy.  Past Medical History  Diagnosis Date  . Diabetes mellitus   . Hypertension   . Diabetes type 1, uncontrolled 11/14/2011    Since age 23  . Gastroparesis    Past Surgical History  Procedure Laterality Date  . Ankle surgery    . Cholecystectomy  11/15/2011    Procedure: LAPAROSCOPIC CHOLECYSTECTOMY WITH INTRAOPERATIVE CHOLANGIOGRAM;  Surgeon: [name], MD;  Location: [facility];  Service: General;  Laterality: N/A;  . Laparoscopy  11/23/2011    Procedure: LAPAROSCOPY DIAGNOSTIC;  Surgeon: Mariella Saa, MD;  Location: [facility];  Service: General;  Laterality: N/A;  . Esophagogastroduodenoscopy  12/03/2011    Procedure: ESOPHAGOGASTRODUODENOSCOPY (EGD);  Surgeon: Theda Belfast, MD;  Location: [facility];  Service: Endoscopy;  Laterality: N/A;   Family History  Problem Relation Age of Onset  . Diabetes Mother   . Hypertension Father    History  Substance Use Topics  . Smoking status: Never Smoker   . Smokeless tobacco: Never Used  . Alcohol Use: Yes     Comment: Occasional   OB History   Grav Para Term Preterm Abortions TAB SAB Ect Mult Living                 Review of Systems  Constitutional: Positive for fatigue. Negative for fever, chills, diaphoresis and appetite change.  HENT: Negative for mouth sores, sore throat and trouble swallowing.   Eyes: Negative for visual disturbance.  Respiratory: Negative for cough, chest tightness, shortness of breath  and wheezing.   Cardiovascular: Negative for chest pain.  Gastrointestinal: Negative for nausea, vomiting, abdominal pain, diarrhea and abdominal distention.  Endocrine: Negative for polydipsia, polyphagia and polyuria.  Genitourinary: Negative for dysuria, frequency and hematuria.  Musculoskeletal: Negative for gait problem.  Skin: Negative for color change, pallor and rash.  Neurological: Positive for weakness. Negative for dizziness, syncope, light-headedness and headaches.  Hematological: Does not bruise/bleed easily.  Psychiatric/Behavioral: Negative for behavioral problems and confusion.      Allergies  Peanut-containing drug products  Home Medications   Current Outpatient Rx  Name  Route  Sig  Dispense  Refill  . cyclobenzaprine (FLEXERIL) 10 MG tablet   Oral   Take 10 mg by mouth 3 (three) times daily as needed for muscle spasms.         . DULoxetine (CYMBALTA) 30 MG capsule   Oral   Take 30 mg by mouth at bedtime.         . enalapril (VASOTEC) 10 MG tablet   Oral   Take 10 mg by mouth every evening.          . gabapentin (NEURONTIN) 400 MG capsule   Oral   Take 2 capsules (800 mg total) by mouth 4 (four) times daily.   240 capsule   5   . insulin glargine (LANTUS) 100 UNIT/ML injection   Subcutaneous   Inject 40  Units into the skin at bedtime.         . insulin lispro (HUMALOG) 100 UNIT/ML injection   Subcutaneous   Inject 5-15 Units into the skin 3 (three) times daily with meals. Inject 5 units (base) and additional units according to sliding scale- sliding scale: CBG 70 - 120: 0 units: CBG 121 - 150: 2 units; CBG 151 - 200: 3 units; CBG 201 - 250: 5 units; CBG 251 - 300: 8 units; CBG 301 - 350: 11 units; CBG 351 - 400: 15 units; CBG > 400 Call MD         . metoCLOPramide (REGLAN) 10 MG tablet   Oral   Take 10 mg by mouth 3 (three) times daily as needed for nausea. Takes with each meal only as needed for nausea.         . metoprolol tartrate  (LOPRESSOR) 25 MG tablet   Oral   Take 0.5 tablets (12.5 mg total) by mouth 2 (two) times daily.   30 tablet   1   . oxyCODONE-acetaminophen (PERCOCET) 5-325 MG per tablet   Oral   Take 1-2 tablets by mouth every 4 (four) hours as needed for severe pain.   10 tablet   0   . promethazine (PHENERGAN) 25 MG tablet   Oral   Take 25 mg by mouth every 6 (six) hours as needed for nausea or vomiting. For nausea         . tetrahydrozoline 0.05 % ophthalmic solution   Both Eyes   Place 1 drop into both eyes 3 (three) times daily as needed (dry eyes).         . traMADol (ULTRAM) 50 MG tablet   Oral   Take 1 tablet by mouth every 6 (six) hours as needed for moderate pain. pain          BP 94/54  Pulse 118  Temp(Src) 98.5 F (36.9 C) (Oral)  Resp 18  SpO2 100%  LMP 05/14/2013 Physical Exam  Constitutional: She is oriented to person, place, and time. She appears well-developed and well-nourished. No distress.  HENT:  Head: Normocephalic.  Eyes: Conjunctivae are normal. Pupils are equal, round, and reactive to light. No scleral icterus.  Neck: Normal range of motion. Neck supple. No thyromegaly present.  Cardiovascular: Normal rate and regular rhythm.  Exam reveals no gallop and no friction rub.   No murmur heard. Pulmonary/Chest: Effort normal and breath sounds normal. No respiratory distress. She has no wheezes. She has no rales.  She's not to give me. She does have an odor of ketones.  Abdominal: Soft. Bowel sounds are normal. She exhibits no distension. There is no tenderness. There is no rebound.  Musculoskeletal: Normal range of motion.  Neurological: She is alert and oriented to person, place, and time.  Skin: Skin is warm and dry. No rash noted.  Psychiatric: She has a normal mood and affect. Her behavior is normal.    ED Course  Procedures (including critical care time) Labs Review Labs Reviewed  CBC WITH DIFFERENTIAL - Abnormal; Notable for the following:    RDW  11.3 (*)    All other components within normal limits  COMPREHENSIVE METABOLIC PANEL - Abnormal; Notable for the following:    Sodium 131 (*)    Chloride 89 (*)    Glucose, Bld 453 (*)    Creatinine, Ser 0.44 (*)    Total Protein 8.7 (*)    AST 65 (*)    ALT 93 (*)  Alkaline Phosphatase 133 (*)    Total Bilirubin 0.2 (*)    All other components within normal limits  URINALYSIS, ROUTINE W REFLEX MICROSCOPIC - Abnormal; Notable for the following:    Specific Gravity, Urine 1.040 (*)    Glucose, UA >1000 (*)    Ketones, ur 15 (*)    All other components within normal limits  CBG MONITORING, ED - Abnormal; Notable for the following:    Glucose-Capillary 453 (*)    All other components within normal limits  CBG MONITORING, ED - Abnormal; Notable for the following:    Glucose-Capillary 319 (*)    All other components within normal limits  CBG MONITORING, ED - Abnormal; Notable for the following:    Glucose-Capillary 367 (*)    All other components within normal limits  KETONES, QUALITATIVE  LIPASE, BLOOD  HCG, SERUM, QUALITATIVE  URINE MICROSCOPIC-ADD ON   Imaging Review No results found.   EKG Interpretation   Date/Time:  Tuesday May 31 2013 17:41:53 EST Ventricular Rate:  140 PR Interval:  93 QRS Duration: 82 QT Interval:  402 QTC Calculation: 614 R Axis:   72 Text Interpretation:  Sinus tachycardia Nonspecific T abnrm, anterolateral  leads Prolonged QT interval Baseline wander in lead(s) V4 Confirmed by  Fayrene Fearing  MD, Nur Rabold ([id number]) on 05/31/2013 5:46:51 PM      MDM   Final diagnoses:  Hyperglycemia    Is is not acidotic. She is feeling much better. Her rate down to 103. Not acidotic. Her glucose is 260. She feels comfortable managing this at home. She is taking by mouth without difficulty here. She has a son at home. Discharge is stable condition.    Rolland Porter, MD 05/31/13 [phone]

## 2013-06-07 ENCOUNTER — Ambulatory Visit (INDEPENDENT_AMBULATORY_CARE_PROVIDER_SITE_OTHER): Payer: 59 | Admitting: Internal Medicine

## 2013-06-07 ENCOUNTER — Encounter: Payer: Self-pay | Admitting: Internal Medicine

## 2013-06-07 VITALS — BP 90/62 | HR 141 | Ht 67.0 in | Wt 117.8 lb

## 2013-06-07 DIAGNOSIS — R000 Tachycardia, unspecified: Secondary | ICD-10-CM

## 2013-06-07 DIAGNOSIS — R079 Chest pain, unspecified: Secondary | ICD-10-CM

## 2013-06-07 NOTE — Progress Notes (Signed)
ELECTROPHYSIOLOGY CONSULT NOTE  Patient ID: [name], MRN: [id number], DOB/AGE: 03-Feb-1991 23 y.o. Admit date: (Not on file) Date of Consult: 06/07/2013  Primary Physician: Salena Saner., MD Primary Cardiologist:  new  Chief Complaint: * dizziness    HPI [name] is a 23 y.o. female  She has had problemswith orthostatic dizziness     which have been relieved by rehydration the emergency room as recently as 05/25/13   She was hospitalized for this also January 2015. Echocardiogram at that time demonstrated normal left ventricular function without wall motion abnormalities  She's been seen in the emergency room multiple times in the last 2 months. She attributes this change to having recently lost her job and thinking that the anxiety and the stresses related that has aggravated her pain syndromes.these visits have been for chest pain, generalized pain as well as on 2 occasions dizziness.. CTs have been negative in the past. She has a history of fibromyalgia.   She has orthostatic intolerance. Her symptoms are worse at the time of her periods which have come back with a vengeance she says following the resolution of her gastroparesis.  She has lost 150 pounds in the last couple of years which is attributed to gastroparesis. She acknowledges anxiety and some depression. We broached the subject of eating disorders.       Past Medical History  Diagnosis Date  . Diabetes mellitus   . Hypertension   . Diabetes type 1, uncontrolled 11/14/2011    Since age 32  . Gastroparesis       Surgical History:  Past Surgical History  Procedure Laterality Date  . Ankle surgery    . Cholecystectomy  11/15/2011    Procedure: LAPAROSCOPIC CHOLECYSTECTOMY WITH INTRAOPERATIVE CHOLANGIOGRAM;  Surgeon: [name], MD;  Location: [facility];  Service: General;  Laterality: N/A;  . Laparoscopy  11/23/2011    Procedure: LAPAROSCOPY DIAGNOSTIC;  Surgeon: Edward Jolly, MD;   Location: [facility];  Service: General;  Laterality: N/A;  . Esophagogastroduodenoscopy  12/03/2011    Procedure: ESOPHAGOGASTRODUODENOSCOPY (EGD);  Surgeon: Beryle Beams, MD;  Location: [facility];  Service: Endoscopy;  Laterality: N/A;     Home Meds: Prior to Admission medications   Medication Sig Start Date End Date Taking? Authorizing Provider  cyclobenzaprine (FLEXERIL) 10 MG tablet Take 10 mg by mouth 3 (three) times daily as needed for muscle spasms. 05/21/13  Yes Janice Norrie, MD  DULoxetine (CYMBALTA) 30 MG capsule Take 30 mg by mouth at bedtime.   Yes Historical Provider, MD  enalapril (VASOTEC) 10 MG tablet Take 10 mg by mouth every evening.    Yes Historical Provider, MD  gabapentin (NEURONTIN) 400 MG capsule Take 2 capsules (800 mg total) by mouth 4 (four) times daily. 05/04/13  Yes Philmore Pali, NP  Gabapentin, PHN, 300 MG TABS Take 1,200 mg by mouth.   Yes Historical Provider, MD  insulin glargine (LANTUS) 100 UNIT/ML injection Inject 40 Units into the skin at bedtime.   Yes Historical Provider, MD  insulin lispro (HUMALOG) 100 UNIT/ML injection Inject 5-15 Units into the skin 3 (three) times daily with meals. Inject 5 units (base) and additional units according to sliding scale- sliding scale: CBG 70 - 120: 0 units: CBG 121 - 150: 2 units; CBG 151 - 200: 3 units; CBG 201 - 250: 5 units; CBG 251 - 300: 8 units; CBG 301 - 350: 11 units; CBG 351 - 400: 15 units; CBG >  400 Call MD   Yes Historical Provider, MD  metoCLOPramide (REGLAN) 10 MG tablet Take 10 mg by mouth 3 (three) times daily as needed for nausea. Takes with each meal only as needed for nausea. 03/16/12  Yes Srikar Janna Arch, MD  metoprolol tartrate (LOPRESSOR) 25 MG tablet Take 0.5 tablets (12.5 mg total) by mouth 2 (two) times daily. 04/29/13  Yes Costin Karlyne Greenspan, MD  omeprazole (PRILOSEC) 40 MG capsule Take 40 mg by mouth. 01/21/12  Yes Historical Provider, MD  oxyCODONE-acetaminophen (PERCOCET) 5-325 MG per tablet Take 1-2  tablets by mouth every 4 (four) hours as needed for severe pain. 05/29/13  Yes Julianne Rice, MD  promethazine (PHENERGAN) 25 MG tablet Take 25 mg by mouth every 6 (six) hours as needed for nausea or vomiting. For nausea   Yes Historical Provider, MD  tetrahydrozoline 0.05 % ophthalmic solution Place 1 drop into both eyes 3 (three) times daily as needed (dry eyes).   Yes Historical Provider, MD  traMADol (ULTRAM) 50 MG tablet Take 1 tablet by mouth every 6 (six) hours as needed for moderate pain. pain 05/26/13  Yes Historical Provider, MD    4 over the   Allergies:  Allergies  Allergen Reactions  . Peanut-Containing Drug Products Anaphylaxis    [place] nuts specifically      History   Social History  . Marital Status: Married    Spouse Name: sergio    Number of Children: 0  . Years of Education: college   Occupational History  . [employer]    Social History Main Topics  . Smoking status: Never Smoker   . Smokeless tobacco: Never Used  . Alcohol Use: Yes     Comment: Occasional  . Drug Use: No  . Sexual Activity: Yes   Other Topics Concern  . Not on file   Social History Narrative  . No narrative on file     Family History  Problem Relation Age of Onset  . Diabetes Mother   . Hypertension Father      ROS:  Please see the history of present illness.     All other systems reviewed and negative.    Physical Exam:   Blood pressure 90/62, pulse 141, height 5' 7"  (1.702 m), weight 117 lb 12.8 oz (53.434 kg), last menstrual period 05/14/2013. General: Well developed, cacechtic   female in no acute distress. Head: Normocephalic, atraumatic, sclera non-icteric, no xanthomas, nares are without discharge. EENT: normal Lymph Nodes:  none Back: without scoliosis/kyphosis , no CVA tendersness Neck: Negative for carotid bruits. JVD not elevated. Lungs: Clear bilaterally to auscultation without wheezes, rales, or rhonchi. Breathing is unlabored. Heart: RRR with S1 S2. No *  murmur , rubs, or gallops appreciated. Abdomen: Soft, non-tender, non-distended with normoactive bowel sounds. No hepatomegaly. No rebound/guarding. No obvious abdominal masses. Msk:  Strength and tone appear normal fo  edema.  Distal pedal pulses are 2+ and equal bilaterally. Skin: Warm and Dry Neuro: Alert and oriented X 3. CN III-XII intact Grossly normal sensory and motor function . Psych:  Responds to questions appropriately with a normal affect.      Labs: Cardiac Enzymes No results found for this basename: CKTOTAL, CKMB, TROPONINI,  in the last 72 hours CBC Lab Results  Component Value Date   WBC 6.2 05/31/2013   HGB 14.4 05/31/2013   HCT 40.6 05/31/2013   MCV 86.0 05/31/2013   PLT 244 05/31/2013   PROTIME: No results found for this basename: LABPROT,  INR,  in the last 72 hours Chemistry  Recent Labs Lab 05/31/13 1749  NA 131*  K 4.0  CL 89*  CO2 27  BUN 8  CREATININE 0.44*  CALCIUM 10.4  PROT 8.7*  BILITOT 0.2*  ALKPHOS 133*  ALT 93*  AST 65*  GLUCOSE 453*   Lipids Lab Results  Component Value Date   CHOL 133 01/26/2009   HDL 57 01/26/2009   LDLCALC 62 01/26/2009   TRIG 72 01/26/2009   BNP Pro B Natriuretic peptide (BNP)  Date/Time Value Ref Range Status  11/14/2011  4:05 PM 20.0  0 - 125 pg/mL Final   Miscellaneous Lab Results  Component Value Date   DDIMER <0.27 05/09/2013    Radiology/Studies:  Dg Chest 2 View  05/23/2013   CLINICAL DATA:  CHEST PAIN  EXAM: CHEST  2 VIEW  COMPARISON:  CT ANGIO CHEST W/CM &/OR WO/CM dated 05/22/2013; DG CHEST 2 VIEW dated 05/09/2013  FINDINGS: The heart size and mediastinal contours are within normal limits. Both lungs are clear. The visualized skeletal structures are unremarkable.  IMPRESSION: No active cardiopulmonary disease.   Electronically Signed   By: Margaree Mackintosh M.D.   On: 05/23/2013 13:05   Dg Chest 2 View  05/09/2013   CLINICAL DATA:  Chest pain.  EXAM: CHEST  2 VIEW  COMPARISON:  DG CHEST 2 VIEW dated 05/07/2013   FINDINGS: The heart size and mediastinal contours are within normal limits. Both lungs are clear. The visualized skeletal structures are unremarkable.  IMPRESSION: No active cardiopulmonary disease.   Electronically Signed   By: Margaree Mackintosh M.D.   On: 05/09/2013 21:36   Ct Angio Chest Pe W/cm &/or Wo Cm  05/22/2013   CLINICAL DATA:  Multiple episodes of chest pain in chest tightness with multiple ED visits, history diabetes, hypertension  EXAM: CT ANGIOGRAPHY CHEST WITH CONTRAST  TECHNIQUE: Multidetector CT imaging of the chest was performed using the standard protocol during bolus administration of intravenous contrast. Multiplanar CT image reconstructions and MIPs were obtained to evaluate the vascular anatomy.  CONTRAST:  125m OMNIPAQUE IOHEXOL 350 MG/ML SOLN  COMPARISON:  02/28/2012  FINDINGS: Aorta normal caliber without aneurysm or dissection.  Residual thymic tissue in anterior mediastinum.  No thoracic adenopathy.  Visualized portion of upper abdomen normal appearance.  Pulmonary arteries well opacified and patent.  No evidence of pulmonary embolism.  Lungs clear.  No infiltrate, pleural effusion, pneumothorax or mass/nodule.  Bones unremarkable.  Review of the MIP images confirms the above findings.  IMPRESSION: Normal CTA chest.   Electronically Signed   By: MLavonia DanaM.D.   On: 05/22/2013 11:33    EKG: Sinus at 126  13/08/31  Assessment and Plan:   Sinus tachycardia as  Diabetes  Significant minimal weight loss  Chest pain/neuropathic pain  Orthostatic intolerance   The patient has long-standing tachycardia. Records over the last few years demonstrates multiple recordings or faster than 100 beats per minute. Interestingly, her blood pressure has gone from being hypertensive a year or 2 ago to be hypotensive; this correlates with an interval 150 pound weight loss. She  attributes this gastroparesis;  She denies eating disorders   She is also prone to anxiety which may be  contributing.  She has had problems with orthostatic intolerance urinary testing shows specific gravity 1.04  Range   I suspect she is in part volume deplete. She's also been treated in the past with ACE inhibitors in the context of her hypertension; these  have not been discontinued. Beta blockers were started likely aggravating her hypotension although was a reasonable thing to consider in the setting of inappropriate sinus tachycardia.  I've encouraged her to increase her salt and water intake. We'll discontinue the aforementioned medications. I encouraged her to pursue therapy for her gynecologist to minimize her new metamenorrhagia as well as undertake aerobic training.  we'll see her in a few months. Virl Axe

## 2013-06-07 NOTE — Patient Instructions (Signed)
Your physician has recommended you make the following change in your medication:  1) Stop enalapril 2) Stop Metoprolol tartrate  Increase your salt intake  Your physician recommends that you schedule a follow-up appointment in: 3 months with Dr. Graciela Husbands.

## 2013-06-10 ENCOUNTER — Inpatient Hospital Stay (HOSPITAL_COMMUNITY)
Admission: EM | Admit: 2013-06-10 | Discharge: 2013-06-12 | DRG: 639 | Disposition: A | Discharge status: Home / Self Care | Payer: 59 | Source: Ambulatory Visit | Attending: Internal Medicine | Admitting: Internal Medicine

## 2013-06-10 ENCOUNTER — Emergency Department (HOSPITAL_COMMUNITY): Payer: 59

## 2013-06-10 ENCOUNTER — Encounter (HOSPITAL_COMMUNITY): Payer: Self-pay | Admitting: Emergency Medicine

## 2013-06-10 DIAGNOSIS — Z79899 Other long term (current) drug therapy: Secondary | ICD-10-CM

## 2013-06-10 DIAGNOSIS — E1100 Type 2 diabetes mellitus with hyperosmolarity without nonketotic hyperglycemic-hyperosmolar coma (NKHHC): Secondary | ICD-10-CM

## 2013-06-10 DIAGNOSIS — G903 Multi-system degeneration of the autonomic nervous system: Secondary | ICD-10-CM

## 2013-06-10 DIAGNOSIS — Z833 Family history of diabetes mellitus: Secondary | ICD-10-CM

## 2013-06-10 DIAGNOSIS — I951 Orthostatic hypotension: Secondary | ICD-10-CM

## 2013-06-10 DIAGNOSIS — E1159 Type 2 diabetes mellitus with other circulatory complications: Secondary | ICD-10-CM

## 2013-06-10 DIAGNOSIS — E871 Hypo-osmolality and hyponatremia: Secondary | ICD-10-CM

## 2013-06-10 DIAGNOSIS — E860 Dehydration: Secondary | ICD-10-CM | POA: Diagnosis present

## 2013-06-10 DIAGNOSIS — R1013 Epigastric pain: Secondary | ICD-10-CM

## 2013-06-10 DIAGNOSIS — Z9119 Patient's noncompliance with other medical treatment and regimen: Secondary | ICD-10-CM

## 2013-06-10 DIAGNOSIS — E1010 Type 1 diabetes mellitus with ketoacidosis without coma: Principal | ICD-10-CM | POA: Diagnosis present

## 2013-06-10 DIAGNOSIS — E43 Unspecified severe protein-calorie malnutrition: Secondary | ICD-10-CM

## 2013-06-10 DIAGNOSIS — R109 Unspecified abdominal pain: Secondary | ICD-10-CM

## 2013-06-10 DIAGNOSIS — R0600 Dyspnea, unspecified: Secondary | ICD-10-CM

## 2013-06-10 DIAGNOSIS — I152 Hypertension secondary to endocrine disorders: Secondary | ICD-10-CM

## 2013-06-10 DIAGNOSIS — Z794 Long term (current) use of insulin: Secondary | ICD-10-CM

## 2013-06-10 DIAGNOSIS — R000 Tachycardia, unspecified: Secondary | ICD-10-CM | POA: Diagnosis present

## 2013-06-10 DIAGNOSIS — E873 Alkalosis: Secondary | ICD-10-CM

## 2013-06-10 DIAGNOSIS — R072 Precordial pain: Secondary | ICD-10-CM | POA: Diagnosis present

## 2013-06-10 DIAGNOSIS — E1110 Type 2 diabetes mellitus with ketoacidosis without coma: Secondary | ICD-10-CM

## 2013-06-10 DIAGNOSIS — R739 Hyperglycemia, unspecified: Secondary | ICD-10-CM

## 2013-06-10 DIAGNOSIS — E1049 Type 1 diabetes mellitus with other diabetic neurological complication: Secondary | ICD-10-CM | POA: Diagnosis present

## 2013-06-10 DIAGNOSIS — Z91018 Allergy to other foods: Secondary | ICD-10-CM

## 2013-06-10 DIAGNOSIS — E1065 Type 1 diabetes mellitus with hyperglycemia: Secondary | ICD-10-CM | POA: Diagnosis present

## 2013-06-10 DIAGNOSIS — E108 Type 1 diabetes mellitus with unspecified complications: Secondary | ICD-10-CM

## 2013-06-10 DIAGNOSIS — Z598 Other problems related to housing and economic circumstances: Secondary | ICD-10-CM

## 2013-06-10 DIAGNOSIS — Z5989 Other problems related to housing and economic circumstances: Secondary | ICD-10-CM

## 2013-06-10 DIAGNOSIS — K6289 Other specified diseases of anus and rectum: Secondary | ICD-10-CM

## 2013-06-10 DIAGNOSIS — R079 Chest pain, unspecified: Secondary | ICD-10-CM | POA: Diagnosis present

## 2013-06-10 DIAGNOSIS — IMO0002 Reserved for concepts with insufficient information to code with codable children: Secondary | ICD-10-CM

## 2013-06-10 DIAGNOSIS — Z5987 Material hardship due to limited financial resources, not elsewhere classified: Secondary | ICD-10-CM

## 2013-06-10 DIAGNOSIS — Z8249 Family history of ischemic heart disease and other diseases of the circulatory system: Secondary | ICD-10-CM

## 2013-06-10 DIAGNOSIS — Z91199 Patient's noncompliance with other medical treatment and regimen due to unspecified reason: Secondary | ICD-10-CM

## 2013-06-10 DIAGNOSIS — K5900 Constipation, unspecified: Secondary | ICD-10-CM

## 2013-06-10 DIAGNOSIS — R52 Pain, unspecified: Secondary | ICD-10-CM

## 2013-06-10 DIAGNOSIS — I498 Other specified cardiac arrhythmias: Secondary | ICD-10-CM | POA: Diagnosis present

## 2013-06-10 DIAGNOSIS — K3184 Gastroparesis: Secondary | ICD-10-CM | POA: Diagnosis present

## 2013-06-10 DIAGNOSIS — I10 Essential (primary) hypertension: Secondary | ICD-10-CM | POA: Diagnosis present

## 2013-06-10 DIAGNOSIS — E109 Type 1 diabetes mellitus without complications: Secondary | ICD-10-CM

## 2013-06-10 LAB — COMPREHENSIVE METABOLIC PANEL
ALT: 31 U/L (ref 0–35)
AST: 20 U/L (ref 0–37)
Albumin: 4.4 g/dL (ref 3.5–5.2)
Alkaline Phosphatase: 146 U/L — ABNORMAL HIGH (ref 39–117)
BUN: 7 mg/dL (ref 6–23)
CO2: 16 mEq/L — ABNORMAL LOW (ref 19–32)
Calcium: 10.2 mg/dL (ref 8.4–10.5)
Chloride: 89 mEq/L — ABNORMAL LOW (ref 96–112)
Creatinine, Ser: 0.33 mg/dL — ABNORMAL LOW (ref 0.50–1.10)
GFR calc Af Amer: >90 mL/min (ref >90)
GFR calc non Af Amer: >90 mL/min (ref >90)
Glucose, Bld: 379 mg/dL — ABNORMAL HIGH (ref 70–99)
Potassium: 3.8 mEq/L (ref 3.7–5.3)
Sodium: 133 mEq/L — ABNORMAL LOW (ref 137–147)
Total Bilirubin: 0.4 mg/dL (ref 0.3–1.2)
Total Protein: 8.6 g/dL — ABNORMAL HIGH (ref 6.0–8.3)

## 2013-06-10 LAB — CBC WITH DIFFERENTIAL/PLATELET
Basophils Absolute: 0 10*3/uL (ref 0.0–0.1)
Basophils Relative: 1 % (ref 0–1)
Eosinophils Absolute: 0.1 10*3/uL (ref 0.0–0.7)
Eosinophils Relative: 1 % (ref 0–5)
HCT: 39.4 % (ref 36.0–46.0)
Hemoglobin: 14.4 g/dL (ref 12.0–15.0)
Lymphocytes Relative: 38 % (ref 12–46)
Lymphs Abs: 2.2 10*3/uL (ref 0.7–4.0)
MCH: 30.8 pg (ref 26.0–34.0)
MCHC: 36.5 g/dL — ABNORMAL HIGH (ref 30.0–36.0)
MCV: 84.4 fL (ref 78.0–100.0)
Monocytes Absolute: 0.3 10*3/uL (ref 0.1–1.0)
Monocytes Relative: 5 % (ref 3–12)
Neutro Abs: 3.3 10*3/uL (ref 1.7–7.7)
Neutrophils Relative %: 56 % (ref 43–77)
Platelets: 254 10*3/uL (ref 150–400)
RBC: 4.67 MIL/uL (ref 3.87–5.11)
RDW: 11.6 % (ref 11.5–15.5)
WBC: 5.9 10*3/uL (ref 4.0–10.5)

## 2013-06-10 LAB — CBG MONITORING, ED: Glucose-Capillary: 393 mg/dL — ABNORMAL HIGH (ref 70–99)

## 2013-06-10 LAB — POC URINE PREG, ED: Preg Test, Ur: NEGATIVE

## 2013-06-10 LAB — TROPONIN I: Troponin I: <0.3 ng/mL (ref <0.30)

## 2013-06-10 IMAGING — CR DG CHEST 2V
2 series · 2 of 2 positions shown · non-contrast
Comparison: [DATE]

CLINICAL DATA: Chest pain, tachycardia

EXAM:
CHEST  2 VIEW

[w chest pa]
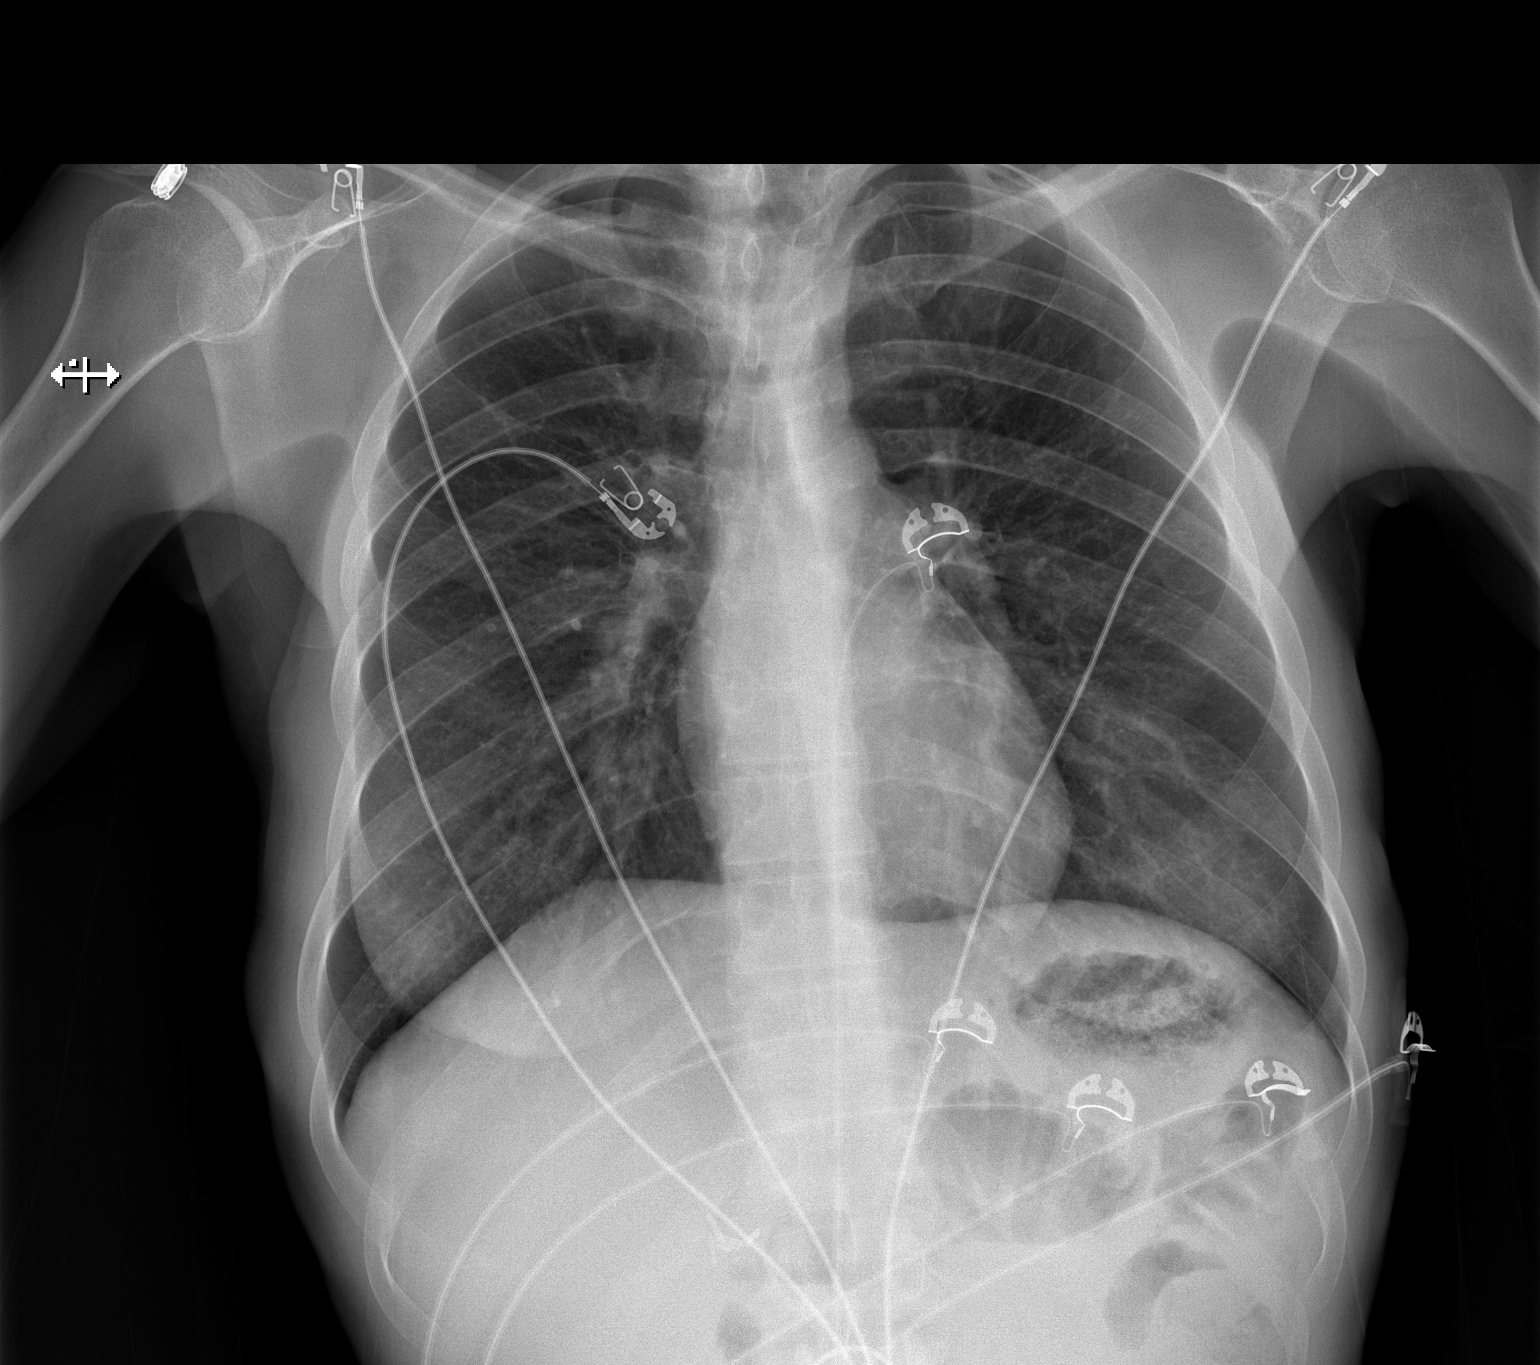

[w chest lat]
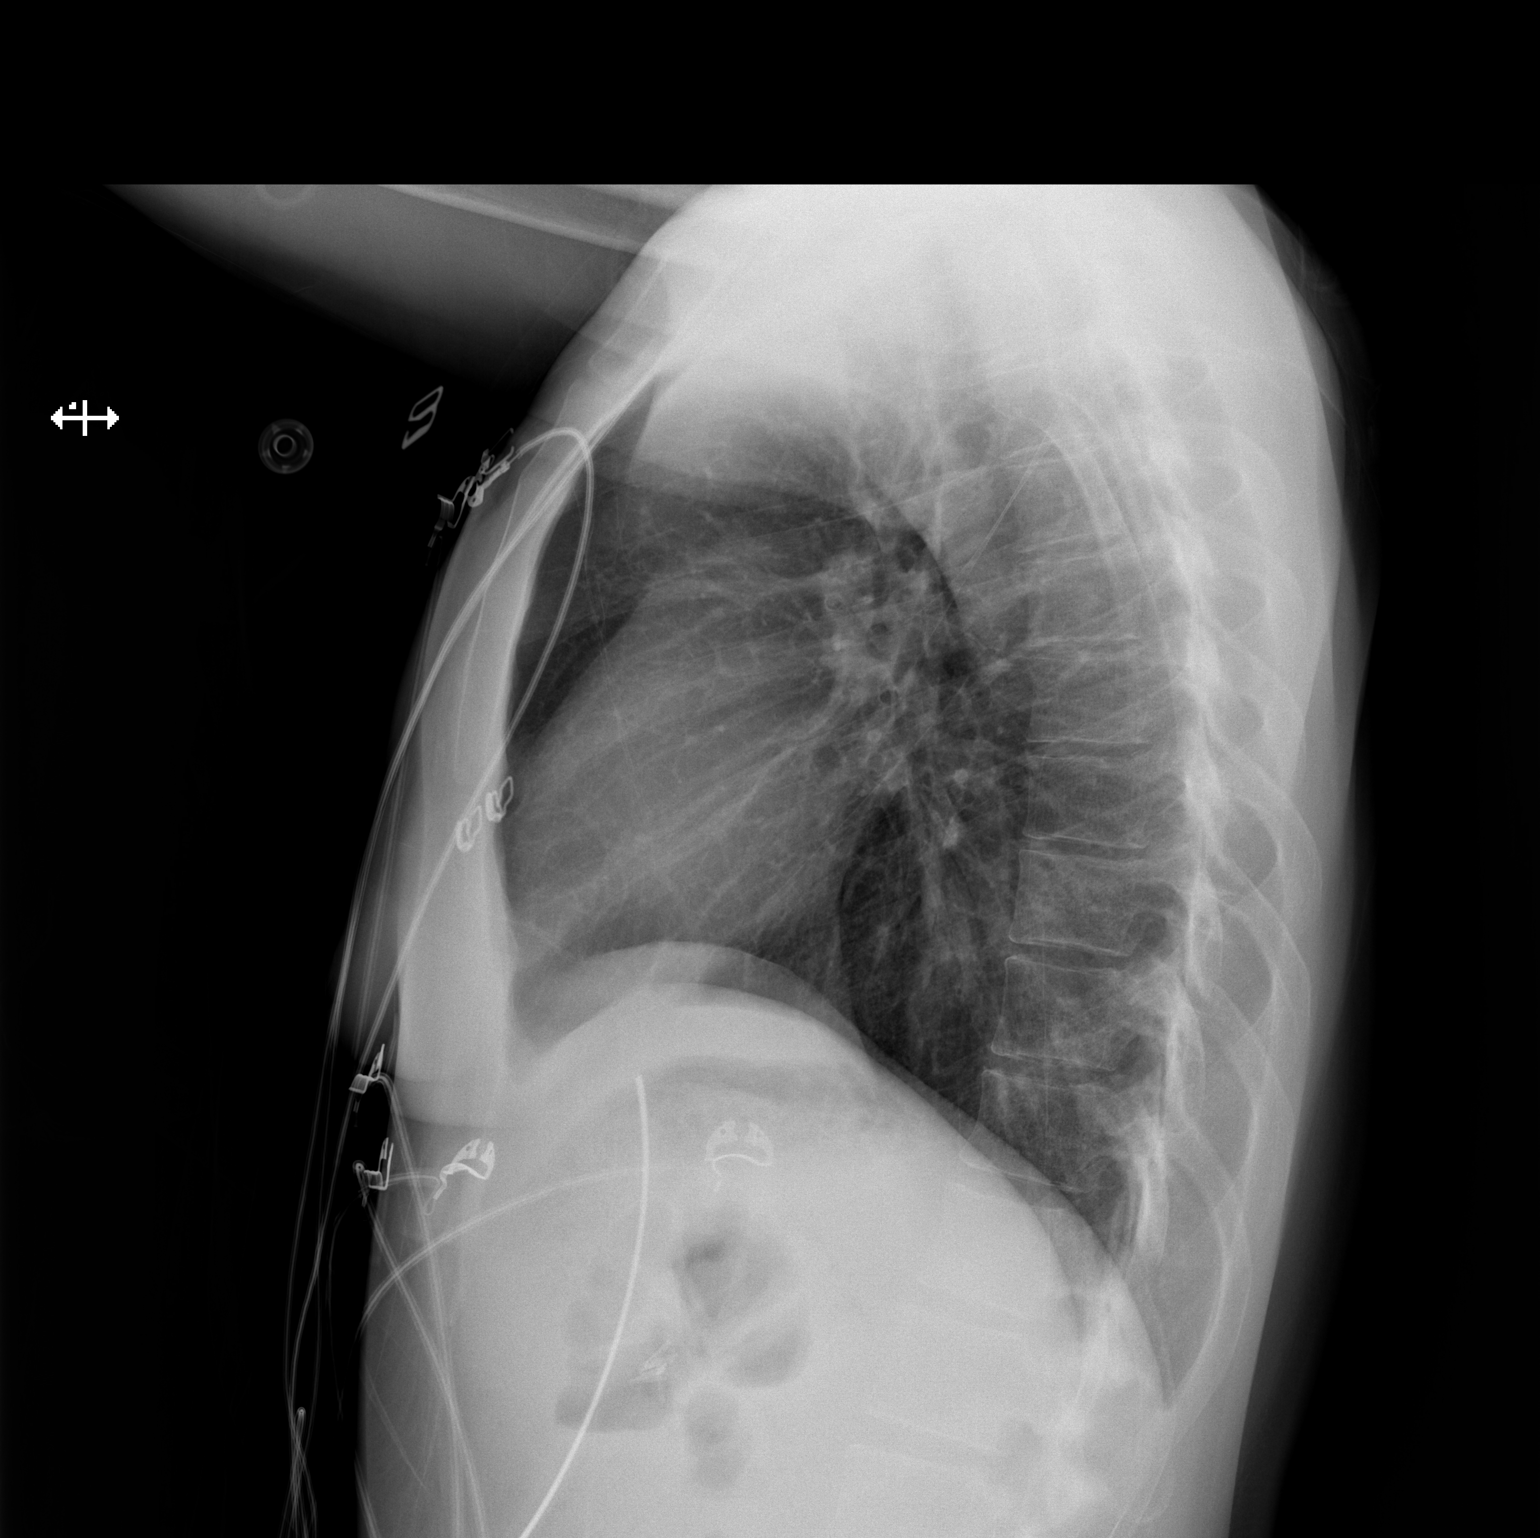

[2 of 2 positions shown; findings below may reference images not displayed]

FINDINGS: The heart size and mediastinal contours are within normal limits.
Both lungs are clear. The visualized skeletal structures are
unremarkable.
IMPRESSION: No active cardiopulmonary disease.

## 2013-06-10 MED ORDER — DEXTROSE-NACL 5-0.45 % IV SOLN: INTRAVENOUS | Status: DC

## 2013-06-10 MED ORDER — MORPHINE SULFATE 4 MG/ML IJ SOLN
4.0000 mg | Freq: Once | INTRAMUSCULAR | Status: AC
  Administered 2013-06-10: 4 mg via INTRAVENOUS
  Filled 2013-06-10: qty 1

## 2013-06-10 MED ORDER — ONDANSETRON HCL 4 MG/2ML IJ SOLN
4.0000 mg | Freq: Once | INTRAMUSCULAR | Status: AC
  Administered 2013-06-10: 4 mg via INTRAVENOUS
  Filled 2013-06-10: qty 2

## 2013-06-10 MED ORDER — SODIUM CHLORIDE 0.9 % IV SOLN: 1000.0000 mL | INTRAVENOUS | Status: DC

## 2013-06-10 MED ORDER — SODIUM CHLORIDE 0.9 % IV BOLUS (SEPSIS)
2000.0000 mL | Freq: Once | INTRAVENOUS | Status: AC
  Administered 2013-06-10: 2000 mL via INTRAVENOUS

## 2013-06-10 MED ORDER — SODIUM CHLORIDE 0.9 % IV SOLN
INTRAVENOUS | Status: DC
  Administered 2013-06-11: 2.1 [IU]/h via INTRAVENOUS
  Filled 2013-06-10: qty 1

## 2013-06-10 MED ORDER — HYDROMORPHONE HCL PF 1 MG/ML IJ SOLN
1.0000 mg | Freq: Once | INTRAMUSCULAR | Status: AC
  Administered 2013-06-11: 1 mg via INTRAVENOUS
  Filled 2013-06-10: qty 1

## 2013-06-10 MED ORDER — SODIUM CHLORIDE 0.9 % IV SOLN
1000.0000 mL | Freq: Once | INTRAVENOUS | Status: AC
  Administered 2013-06-11: 1000 mL via INTRAVENOUS

## 2013-06-10 NOTE — ED Provider Notes (Signed)
CSN: [id number]     Arrival date & time 06/10/13  2105 History   First MD Initiated Contact with Patient 06/10/13 2144     Chief Complaint  Patient presents with  . Hyperglycemia  . Chest Pain  . Nausea     (Consider location/radiation/quality/duration/timing/severity/associated sxs/prior Treatment) The history is provided by the patient and medical records. No language interpreter was used.    [name] is a 23 y.o. female  with a hx of IDDM, HTN, gastroparesis presents to the Emergency Department complaining of gradual, persistent, progressively worsening generalized body pain with associated chest pain onset this AM after waking from sleep. Associated symptoms include tachycardia.  Pt reports taking a percocet for her myalgias, but this did not help her CP today.  Pt reports nothing makes the chest pain better or worse.  Pt reports taking her insulin as directed today, but reports that she has only eaten several pancakes and a few doughnuts.  Pt denies fever, chills, headache, neck pain, cough, SOB, abd pain, N/V/D, weakness, dizziness, syncope, dysuria.     Cardiologist: Sherryl Manges   Past Medical History  Diagnosis Date  . Diabetes mellitus   . Hypertension   . Diabetes type 1, uncontrolled 11/14/2011    Since age 43  . Gastroparesis    Past Surgical History  Procedure Laterality Date  . Ankle surgery    . Cholecystectomy  11/15/2011    Procedure: LAPAROSCOPIC CHOLECYSTECTOMY WITH INTRAOPERATIVE CHOLANGIOGRAM;  Surgeon: [name], MD;  Location: [facility];  Service: General;  Laterality: N/A;  . Laparoscopy  11/23/2011    Procedure: LAPAROSCOPY DIAGNOSTIC;  Surgeon: Mariella Saa, MD;  Location: [facility];  Service: General;  Laterality: N/A;  . Esophagogastroduodenoscopy  12/03/2011    Procedure: ESOPHAGOGASTRODUODENOSCOPY (EGD);  Surgeon: Theda Belfast, MD;  Location: [facility];  Service: Endoscopy;  Laterality: N/A;   Family History  Problem Relation Age of  Onset  . Diabetes Mother   . Hypertension Father    History  Substance Use Topics  . Smoking status: Never Smoker   . Smokeless tobacco: Never Used  . Alcohol Use: Yes     Comment: Occasional   OB History   Grav Para Term Preterm Abortions TAB SAB Ect Mult Living                 Review of Systems  Constitutional: Negative for fever, diaphoresis, appetite change, fatigue and unexpected weight change.  HENT: Negative for mouth sores.   Eyes: Negative for visual disturbance.  Respiratory: Negative for cough, chest tightness, shortness of breath and wheezing.   Cardiovascular: Positive for chest pain.  Gastrointestinal: Negative for nausea, vomiting, abdominal pain, diarrhea and constipation.  Endocrine: Negative for polydipsia, polyphagia and polyuria.  Genitourinary: Negative for dysuria, urgency, frequency and hematuria.  Musculoskeletal: Negative for back pain and neck stiffness.  Skin: Negative for rash.  Allergic/Immunologic: Negative for immunocompromised state.  Neurological: Negative for syncope, light-headedness and headaches.  Hematological: Does not bruise/bleed easily.  Psychiatric/Behavioral: Negative for sleep disturbance. The patient is not nervous/anxious.       Allergies  Peanut-containing drug products  Home Medications   Current Outpatient Rx  Name  Route  Sig  Dispense  Refill  . aspirin-acetaminophen-caffeine (EXCEDRIN MIGRAINE) 250-250-65 MG per tablet   Oral   Take 2 tablets by mouth every 6 (six) hours as needed for headache.         . cyclobenzaprine (FLEXERIL) 10 MG tablet   Oral  Take 10 mg by mouth 3 (three) times daily as needed for muscle spasms.         . DULoxetine (CYMBALTA) 30 MG capsule   Oral   Take 30 mg by mouth at bedtime.         . gabapentin (NEURONTIN) 400 MG capsule   Oral   Take 2 capsules (800 mg total) by mouth 4 (four) times daily.   240 capsule   5   . insulin glargine (LANTUS) 100 UNIT/ML injection    Subcutaneous   Inject 40 Units into the skin at bedtime.         . insulin lispro (HUMALOG) 100 UNIT/ML injection   Subcutaneous   Inject 5-15 Units into the skin 3 (three) times daily with meals. Inject 5 units (base) and additional units according to sliding scale- sliding scale: CBG 70 - 120: 0 units: CBG 121 - 150: 2 units; CBG 151 - 200: 3 units; CBG 201 - 250: 5 units; CBG 251 - 300: 8 units; CBG 301 - 350: 11 units; CBG 351 - 400: 15 units; CBG > 400 Call MD         . metoCLOPramide (REGLAN) 10 MG tablet   Oral   Take 10 mg by mouth 3 (three) times daily as needed for nausea. Takes with each meal only as needed for nausea.         Marland Kitchen oxyCODONE-acetaminophen (PERCOCET) 5-325 MG per tablet   Oral   Take 1-2 tablets by mouth every 4 (four) hours as needed for severe pain.   10 tablet   0   . promethazine (PHENERGAN) 25 MG tablet   Oral   Take 25 mg by mouth every 6 (six) hours as needed for nausea or vomiting. For nausea         . traMADol (ULTRAM) 50 MG tablet   Oral   Take 1 tablet by mouth every 6 (six) hours as needed for moderate pain. pain          BP 141/102  Pulse 129  Temp(Src) 98 F (36.7 C) (Oral)  Resp 21  Ht 5\' 7"  (1.702 m)  Wt 120 lb (54.432 kg)  BMI 18.79 kg/m2  SpO2 100%  LMP 06/04/2013 Physical Exam  Nursing note and vitals reviewed. Constitutional: She is oriented to person, place, and time. She appears well-developed. No distress.  Awake, alert, mildly cachectic, nontoxic appearance  HENT:  Head: Normocephalic and atraumatic.  Mouth/Throat: Oropharynx is clear and moist. No oropharyngeal exudate.  Eyes: Conjunctivae are normal. No scleral icterus.  Neck: Normal range of motion. Neck supple.  Cardiovascular: Regular rhythm, normal heart sounds and intact distal pulses.   Tachycardia No murmur  Pulmonary/Chest: Effort normal and breath sounds normal. No respiratory distress. She has no wheezes.  Clear and equal breath sounds  Abdominal:  Soft. Bowel sounds are normal. She exhibits no mass. There is no tenderness. There is no rebound and no guarding.  Abd soft and nontender  Musculoskeletal: Normal range of motion. She exhibits no edema.  Lymphadenopathy:    She has no cervical adenopathy.  Neurological: She is alert and oriented to person, place, and time. She exhibits normal muscle tone. Coordination normal.  Speech is clear and goal oriented Moves extremities without ataxia  Skin: Skin is warm and dry. She is not diaphoretic. No erythema.  Psychiatric: She has a normal mood and affect.    ED Course  Procedures (including critical care time) Labs Review Labs Reviewed  CBC WITH DIFFERENTIAL - Abnormal; Notable for the following:    MCHC 36.5 (*)    All other components within normal limits  COMPREHENSIVE METABOLIC PANEL - Abnormal; Notable for the following:    Sodium 133 (*)    Chloride 89 (*)    CO2 16 (*)    Glucose, Bld 379 (*)    Creatinine, Ser 0.33 (*)    Total Protein 8.6 (*)    Alkaline Phosphatase 146 (*)    All other components within normal limits  URINALYSIS, ROUTINE W REFLEX MICROSCOPIC - Abnormal; Notable for the following:    Specific Gravity, Urine 1.035 (*)    Glucose, UA >1000 (*)    Ketones, ur >80 (*)    All other components within normal limits  CBG MONITORING, ED - Abnormal; Notable for the following:    Glucose-Capillary 393 (*)    All other components within normal limits  TROPONIN I  URINE MICROSCOPIC-ADD ON  POC URINE PREG, ED   Imaging Review Dg Chest 2 View  06/10/2013   CLINICAL DATA:  Chest pain, tachycardia  EXAM: CHEST  2 VIEW  COMPARISON:  05/25/2013  FINDINGS: The heart size and mediastinal contours are within normal limits. Both lungs are clear. The visualized skeletal structures are unremarkable.  IMPRESSION: No active cardiopulmonary disease.   Electronically Signed   By: Marlan Palau M.D.   On: 06/10/2013 23:19     EKG Interpretation   Date/Time:  Friday June 10 2013 21:39:14 EDT Ventricular Rate:  135 PR Interval:  114 QRS Duration: 77 QT Interval:  405 QTC Calculation: 607 R Axis:   76 Text Interpretation:  Sinus tachycardia LAE, consider biatrial enlargement  Prolonged QT interval No significant change since last tracing Confirmed  by YAO  MD, DAVID ([id number]) on 06/10/2013 9:45:24 PM      CRITICAL CARE Performed by: Dierdre Forth Total critical care time: Critical care time was exclusive of separately billable procedures and treating other patients. Critical care was necessary to treat or prevent imminent or life-threatening deterioration. Critical care was time spent personally by me on the following activities: development of treatment plan with patient and/or surrogate as well as nursing, discussions with consultants, evaluation of patient's response to treatment, examination of patient, obtaining history from patient or surrogate, ordering and performing treatments and interventions, ordering and review of laboratory studies, ordering and review of radiographic studies, pulse oximetry and re-evaluation of patient's condition.  MDM   Final diagnoses:  DKA (diabetic ketoacidoses)  Tachycardia  Chest pain  Body aches  Diabetes mellitus type 1   [facility] Melia Omar presents with chest pain and generalized body aches.  She is well known to the department and has a Hx of DKA.  She has had multiple cardiac work-ups without significant findings and is followed by Sherryl Manges.   11:12 PM Pt with AG of 28, likely DKA.  Will begin gluco stabilizer and anticipate admission.  Pt with persistent pain; will re-dose pain control.  Highly doubt ACS, PERC negative.    2D Echo on 04/28/13: Left ventricle: The cavity size was normal. Systolic function was vigorous. The estimated ejection fraction was in the range of 65% to 70%. Wall motion was normal; there were no regional wall motion abnormalities.  CT angio chest 05/22/13: Normal CTA  chest  F/u with Graciela Husbands 06/07/13: pt with sinus tachycardia at that visit.    12:21 AM Pt without sources of infection including normal UA and normal CXR.  Pt  is afebrile.  Pt with keytones in her urine -DKA.  Pt placed on the glucostabilizer and will be admitted.    12:47 AM Discussed with Della GooHarvette Jenkins, MD who will admit to tele.  Orders placed.   Dahlia ClientHannah Bennie Chirico, PA-C 06/11/13 [phone]

## 2013-06-10 NOTE — ED Notes (Signed)
Pt states she follows a cardiologist and had an EKG done this Wednesday that was normal and she says she also sees a neurologist they are trying to find where this pain is coming from.,  Pt states it has been going on for a while,  Just worse tonight and she normally has chest pain when her sugars are elevated

## 2013-06-10 NOTE — ED Notes (Signed)
Pt. Made aware for the need of urine. 

## 2013-06-11 ENCOUNTER — Encounter (HOSPITAL_COMMUNITY): Payer: Self-pay | Admitting: General Practice

## 2013-06-11 DIAGNOSIS — R079 Chest pain, unspecified: Secondary | ICD-10-CM

## 2013-06-11 DIAGNOSIS — K3184 Gastroparesis: Secondary | ICD-10-CM

## 2013-06-11 DIAGNOSIS — E1010 Type 1 diabetes mellitus with ketoacidosis without coma: Secondary | ICD-10-CM | POA: Diagnosis present

## 2013-06-11 DIAGNOSIS — I498 Other specified cardiac arrhythmias: Secondary | ICD-10-CM

## 2013-06-11 DIAGNOSIS — E1110 Type 2 diabetes mellitus with ketoacidosis without coma: Secondary | ICD-10-CM

## 2013-06-11 LAB — URINALYSIS, ROUTINE W REFLEX MICROSCOPIC
Bilirubin Urine: NEGATIVE
Glucose, UA: >1000 mg/dL — AB
Hgb urine dipstick: NEGATIVE
Ketones, ur: >80 mg/dL — AB
Leukocytes, UA: NEGATIVE
Nitrite: NEGATIVE
Protein, ur: NEGATIVE mg/dL
Specific Gravity, Urine: 1.035 — ABNORMAL HIGH (ref 1.005–1.030)
Urobilinogen, UA: 0.2 mg/dL (ref 0.0–1.0)
pH: 5 (ref 5.0–8.0)

## 2013-06-11 LAB — BASIC METABOLIC PANEL
BUN: 5 mg/dL — ABNORMAL LOW (ref 6–23)
BUN: 4 mg/dL — ABNORMAL LOW (ref 6–23)
BUN: 5 mg/dL — ABNORMAL LOW (ref 6–23)
CO2: 17 mEq/L — ABNORMAL LOW (ref 19–32)
CO2: 19 mEq/L (ref 19–32)
CO2: 20 mEq/L (ref 19–32)
Calcium: 8.6 mg/dL (ref 8.4–10.5)
Calcium: 8.7 mg/dL (ref 8.4–10.5)
Calcium: 8.7 mg/dL (ref 8.4–10.5)
Chloride: 103 mEq/L (ref 96–112)
Chloride: 104 mEq/L (ref 96–112)
Chloride: 102 mEq/L (ref 96–112)
Creatinine, Ser: 0.27 mg/dL — ABNORMAL LOW (ref 0.50–1.10)
Creatinine, Ser: 0.26 mg/dL — ABNORMAL LOW (ref 0.50–1.10)
Creatinine, Ser: 0.3 mg/dL — ABNORMAL LOW (ref 0.50–1.10)
GFR calc Af Amer: >90 mL/min (ref >90)
GFR calc Af Amer: >90 mL/min (ref >90)
GFR calc Af Amer: >90 mL/min (ref >90)
GFR calc non Af Amer: >90 mL/min (ref >90)
GFR calc non Af Amer: >90 mL/min (ref >90)
GFR calc non Af Amer: >90 mL/min (ref >90)
Glucose, Bld: 219 mg/dL — ABNORMAL HIGH (ref 70–99)
Glucose, Bld: 120 mg/dL — ABNORMAL HIGH (ref 70–99)
Glucose, Bld: 113 mg/dL — ABNORMAL HIGH (ref 70–99)
Potassium: 3.5 mEq/L — ABNORMAL LOW (ref 3.7–5.3)
Potassium: 3.6 mEq/L — ABNORMAL LOW (ref 3.7–5.3)
Potassium: 3.9 mEq/L (ref 3.7–5.3)
Sodium: 137 mEq/L (ref 137–147)
Sodium: 136 mEq/L — ABNORMAL LOW (ref 137–147)
Sodium: 135 mEq/L — ABNORMAL LOW (ref 137–147)

## 2013-06-11 LAB — CBG MONITORING, ED
Glucose-Capillary: 269 mg/dL — ABNORMAL HIGH (ref 70–99)
Glucose-Capillary: 301 mg/dL — ABNORMAL HIGH (ref 70–99)

## 2013-06-11 LAB — GLUCOSE, CAPILLARY
Glucose-Capillary: 240 mg/dL — ABNORMAL HIGH (ref 70–99)
Glucose-Capillary: 198 mg/dL — ABNORMAL HIGH (ref 70–99)
Glucose-Capillary: 162 mg/dL — ABNORMAL HIGH (ref 70–99)
Glucose-Capillary: 106 mg/dL — ABNORMAL HIGH (ref 70–99)
Glucose-Capillary: 104 mg/dL — ABNORMAL HIGH (ref 70–99)
Glucose-Capillary: 113 mg/dL — ABNORMAL HIGH (ref 70–99)
Glucose-Capillary: 144 mg/dL — ABNORMAL HIGH (ref 70–99)
Glucose-Capillary: 166 mg/dL — ABNORMAL HIGH (ref 70–99)
Glucose-Capillary: 164 mg/dL — ABNORMAL HIGH (ref 70–99)
Glucose-Capillary: 216 mg/dL — ABNORMAL HIGH (ref 70–99)
Glucose-Capillary: 157 mg/dL — ABNORMAL HIGH (ref 70–99)
Glucose-Capillary: 279 mg/dL — ABNORMAL HIGH (ref 70–99)

## 2013-06-11 LAB — TROPONIN I
Troponin I: <0.3 ng/mL (ref <0.30)
Troponin I: <0.3 ng/mL (ref <0.30)
Troponin I: <0.3 ng/mL (ref <0.30)

## 2013-06-11 LAB — CBC
HCT: 33.8 % — ABNORMAL LOW (ref 36.0–46.0)
Hemoglobin: 12.3 g/dL (ref 12.0–15.0)
MCH: 30.7 pg (ref 26.0–34.0)
MCHC: 36.4 g/dL — ABNORMAL HIGH (ref 30.0–36.0)
MCV: 84.3 fL (ref 78.0–100.0)
Platelets: 228 10*3/uL (ref 150–400)
RBC: 4.01 MIL/uL (ref 3.87–5.11)
RDW: 11.5 % (ref 11.5–15.5)
WBC: 6.2 10*3/uL (ref 4.0–10.5)

## 2013-06-11 LAB — TSH: TSH: 2.293 u[IU]/mL (ref 0.350–4.500)

## 2013-06-11 LAB — URINE MICROSCOPIC-ADD ON

## 2013-06-11 LAB — HEMOGLOBIN A1C
Hgb A1c MFr Bld: 11.4 % — ABNORMAL HIGH (ref <5.7)
Mean Plasma Glucose: 280 mg/dL — ABNORMAL HIGH (ref <117)

## 2013-06-11 MED ORDER — INSULIN ASPART 100 UNIT/ML ~~LOC~~ SOLN
0.0000 [IU] | Freq: Three times a day (TID) | SUBCUTANEOUS | Status: DC
  Administered 2013-06-11: 5 [IU] via SUBCUTANEOUS
  Administered 2013-06-11 – 2013-06-12 (×2): 3 [IU] via SUBCUTANEOUS

## 2013-06-11 MED ORDER — INSULIN ASPART 100 UNIT/ML ~~LOC~~ SOLN
0.0000 [IU] | Freq: Every day | SUBCUTANEOUS | Status: DC
  Administered 2013-06-11: 3 [IU] via SUBCUTANEOUS

## 2013-06-11 MED ORDER — DEXTROSE 5 % IV SOLN
INTRAVENOUS | Status: DC
  Administered 2013-06-11: 08:00:00 via INTRAVENOUS

## 2013-06-11 MED ORDER — INSULIN ASPART 100 UNIT/ML ~~LOC~~ SOLN: 0.0000 [IU] | Freq: Three times a day (TID) | SUBCUTANEOUS | Status: DC

## 2013-06-11 MED ORDER — POTASSIUM CHLORIDE 10 MEQ/100ML IV SOLN
10.0000 meq | INTRAVENOUS | Status: AC
  Administered 2013-06-11 (×2): 10 meq via INTRAVENOUS
  Filled 2013-06-11 (×3): qty 100

## 2013-06-11 MED ORDER — SODIUM CHLORIDE 0.9 % IV SOLN: INTRAVENOUS | Status: DC

## 2013-06-11 MED ORDER — ONDANSETRON HCL 4 MG/2ML IJ SOLN
4.0000 mg | Freq: Three times a day (TID) | INTRAMUSCULAR | Status: DC | PRN
  Administered 2013-06-11: 4 mg via INTRAVENOUS
  Filled 2013-06-11: qty 2

## 2013-06-11 MED ORDER — IBUPROFEN 100 MG PO CHEW
400.0000 mg | CHEWABLE_TABLET | Freq: Three times a day (TID) | ORAL | Status: DC | PRN
  Filled 2013-06-11: qty 4

## 2013-06-11 MED ORDER — METOPROLOL TARTRATE 25 MG PO TABS
25.0000 mg | ORAL_TABLET | Freq: Two times a day (BID) | ORAL | Status: DC
Start: 2013-06-11 — End: 2013-06-12
  Administered 2013-06-11 (×2): 25 mg via ORAL
  Filled 2013-06-11 (×4): qty 1

## 2013-06-11 MED ORDER — ONDANSETRON HCL 4 MG/2ML IJ SOLN
4.0000 mg | INTRAMUSCULAR | Status: DC | PRN
  Administered 2013-06-11: 4 mg via INTRAVENOUS
  Filled 2013-06-11: qty 2

## 2013-06-11 MED ORDER — DEXTROSE-NACL 5-0.45 % IV SOLN
INTRAVENOUS | Status: DC
  Administered 2013-06-11: 01:00:00 via INTRAVENOUS

## 2013-06-11 MED ORDER — ONDANSETRON HCL 4 MG/2ML IJ SOLN
4.0000 mg | Freq: Four times a day (QID) | INTRAMUSCULAR | Status: DC | PRN
  Administered 2013-06-11 (×2): 4 mg via INTRAVENOUS
  Filled 2013-06-11 (×2): qty 2

## 2013-06-11 MED ORDER — SODIUM CHLORIDE 0.9 % IV SOLN
INTRAVENOUS | Status: DC
  Administered 2013-06-11: 13:00:00 via INTRAVENOUS

## 2013-06-11 MED ORDER — SODIUM CHLORIDE 0.9 % IV SOLN
INTRAVENOUS | Status: DC
  Administered 2013-06-11: 2.1 [IU]/h via INTRAVENOUS
  Filled 2013-06-11: qty 1

## 2013-06-11 MED ORDER — IBUPROFEN 800 MG PO TABS
400.0000 mg | ORAL_TABLET | Freq: Three times a day (TID) | ORAL | Status: DC | PRN
  Filled 2013-06-11: qty 1

## 2013-06-11 MED ORDER — SODIUM CHLORIDE 0.9 % IV SOLN: INTRAVENOUS | Status: AC

## 2013-06-11 MED ORDER — POTASSIUM CHLORIDE CRYS ER 20 MEQ PO TBCR
40.0000 meq | EXTENDED_RELEASE_TABLET | Freq: Once | ORAL | Status: AC
  Administered 2013-06-11: 40 meq via ORAL
  Filled 2013-06-11: qty 2

## 2013-06-11 MED ORDER — HYDROMORPHONE HCL PF 1 MG/ML IJ SOLN
0.5000 mg | Freq: Once | INTRAMUSCULAR | Status: AC
  Administered 2013-06-11: 0.5 mg via INTRAVENOUS
  Filled 2013-06-11: qty 1

## 2013-06-11 MED ORDER — ACETAMINOPHEN 500 MG PO TABS
500.0000 mg | ORAL_TABLET | Freq: Three times a day (TID) | ORAL | Status: DC | PRN
  Administered 2013-06-11: 500 mg via ORAL
  Filled 2013-06-11 (×2): qty 1

## 2013-06-11 MED ORDER — DEXTROSE 50 % IV SOLN: 25.0000 mL | INTRAVENOUS | Status: DC | PRN

## 2013-06-11 MED ORDER — METOCLOPRAMIDE HCL 5 MG/ML IJ SOLN
10.0000 mg | Freq: Three times a day (TID) | INTRAMUSCULAR | Status: DC
  Administered 2013-06-11 – 2013-06-12 (×3): 10 mg via INTRAVENOUS
  Filled 2013-06-11 (×7): qty 2

## 2013-06-11 MED ORDER — INSULIN GLARGINE 100 UNIT/ML ~~LOC~~ SOLN
40.0000 [IU] | Freq: Every day | SUBCUTANEOUS | Status: DC
  Administered 2013-06-11: 40 [IU] via SUBCUTANEOUS
  Filled 2013-06-11 (×2): qty 0.4

## 2013-06-11 MED ORDER — ENOXAPARIN SODIUM 40 MG/0.4ML ~~LOC~~ SOLN
40.0000 mg | SUBCUTANEOUS | Status: DC
  Administered 2013-06-11: 40 mg via SUBCUTANEOUS
  Filled 2013-06-11 (×2): qty 0.4

## 2013-06-11 MED ORDER — HYDROMORPHONE HCL PF 1 MG/ML IJ SOLN
1.0000 mg | INTRAMUSCULAR | Status: DC | PRN
  Administered 2013-06-11 (×2): 1 mg via INTRAVENOUS
  Filled 2013-06-11 (×2): qty 1

## 2013-06-11 MED ORDER — MAGNESIUM SULFATE IN D5W 10-5 MG/ML-% IV SOLN
1.0000 g | Freq: Once | INTRAVENOUS | Status: AC
  Administered 2013-06-11: 1 g via INTRAVENOUS
  Filled 2013-06-11: qty 100

## 2013-06-11 NOTE — Progress Notes (Signed)
Patient was requesting narcotics and nausea medication. MD was notified. MD does not want patient to have any narcotics at this time. MD ordered Tylenol 500 mg q8 PRN and zofran IV q 4 PRN. When entering room, RN found patient asleep. Woke patient to inform her that she cannot have narcotics. Patient said that she did not want to take the Tylenol, but would take the Zofran ordered. Will continue monitor and to try to keep patient comfortable. Setzer, Don Broach

## 2013-06-11 NOTE — Progress Notes (Signed)
Patient Demographics  [name], is a 23 y.o. female, DOB - 1990/07/25, DEY:[id number]  Admit date - 06/10/2013   Admitting Physician Theressa Millard, MD  Outpatient Primary MD for the patient is Salena Saner., MD  LOS - 1   Chief Complaint  Patient presents with  . Hyperglycemia  . Chest Pain  . Nausea        Assessment & Plan    1. DKA in Type 1 Diabetic- secondary to running out of insulin due to financial issues, gap almost closed, Lantus given, will stop IV D5 along with insulin drip in a few hours. Thereafter sliding scale insulin. A1c ordered. Will monitor CBGs and adjust medications as needed. Case management consulted to assist the patient with her medication needs.    Lab Results  Component Value Date   HGBA1C 11.9* 09/01/2012    CBG (last 3)   Recent Labs  06/11/13 0710 06/11/13 0816 06/11/13 0923  GLUCAP 113* 144* 166*        2. Atypical Chest Pain- straight of musculoskeletal chest pain, seen by cardiologist Dr. Caryl Comes few days ago. Echo done last month shows EF of 60% with no wall motion abnormalities, currently pain-free, EKG troponin stable. No further cardiac workup.      3. History of chronic Sinus Tachycardia - check TSH, followed by electrophysiologist Dr. Caryl Comes, echo recently done stable. Supportive care. If blood pressure high we'll add low-dose beta blocker.    Lab Results  Component Value Date   TSH 1.385 04/28/2013       4. Dehydration due to #1- IVFs to rehydrate.      5. Gastroparesis- scheduled Reglan  premeal.     Code Status: Full  Family Communication:    Disposition Plan: Home   Procedures     Consults      Medications  Scheduled Meds: . sodium chloride   Intravenous STAT  . enoxaparin (LOVENOX) injection   40 mg Subcutaneous Q24H  . insulin glargine  40 Units Subcutaneous QHS  . magnesium sulfate 1 - 4 g bolus IVPB  1 g Intravenous Once  . metoCLOPramide (REGLAN) injection  10 mg Intravenous TID AC  . metoprolol tartrate  25 mg Oral BID   Continuous Infusions: . sodium chloride    . sodium chloride    . dextrose 200 mL/hr at 06/11/13 0804  . dextrose 5 % and 0.45% NaCl    . dextrose 5 % and 0.45% NaCl 75 mL/hr at 06/11/13 0100  . insulin (NOVOLIN-R) infusion Stopped (06/11/13 0713)   PRN Meds:.dextrose, ondansetron (ZOFRAN) IV  DVT Prophylaxis  Lovenox   Lab Results  Component Value Date   PLT 228 06/11/2013    Antibiotics   Anti-infectives   None          Subjective:   [name] today has, No headache, No chest pain, No abdominal pain - No Nausea, No new weakness tingling or numbness, No Cough - SOB.    Objective:   Filed Vitals:   06/11/13 0045 06/11/13 0142 06/11/13 0614 06/11/13 0903  BP:  146/96 121/66 134/87  Pulse:  117 118 113  Temp:  98.1 F (36.7 C) 97.5 F (36.4 C)   TempSrc:  Oral Oral  Resp: 12 16 16    Height:  5' 7"  (1.702 m)    Weight:  53.2 kg (117 lb 4.6 oz)    SpO2:  100% 100%     Wt Readings from Last 3 Encounters:  06/11/13 53.2 kg (117 lb 4.6 oz)  06/07/13 53.434 kg (117 lb 12.8 oz)  05/23/13 54.432 kg (120 lb)     Intake/Output Summary (Last 24 hours) at 06/11/13 0957 Last data filed at 06/11/13 0900  Gross per 24 hour  Intake 376.25 ml  Output      0 ml  Net 376.25 ml     Physical Exam  Awake Alert, Oriented X 3, No new F.N deficits, Normal affect [name],PERRAL Supple Neck,No JVD, No cervical lymphadenopathy appriciated.  Symmetrical Chest wall movement, Good air movement bilaterally, CTAB Rapid RRR,No Gallops,Rubs or new Murmurs, No Parasternal Heave +ve B.Sounds, Abd Soft, Non tender, No organomegaly appriciated, No rebound - guarding or rigidity. No Cyanosis, Clubbing or edema, No new Rash or bruise      Data  Review   Micro Results No results found for this or any previous visit (from the past 240 hour(s)).  Radiology Reports Dg Chest 2 View  06/10/2013   CLINICAL DATA:  Chest pain, tachycardia  EXAM: CHEST  2 VIEW  COMPARISON:  05/25/2013  FINDINGS: The heart size and mediastinal contours are within normal limits. Both lungs are clear. The visualized skeletal structures are unremarkable.  IMPRESSION: No active cardiopulmonary disease.   Electronically Signed   By: Franchot Gallo M.D.   On: 06/10/2013 23:19       CBC  Recent Labs Lab 06/10/13 2206 06/11/13 0235  WBC 5.9 6.2  HGB 14.4 12.3  HCT 39.4 33.8*  PLT 254 228  MCV 84.4 84.3  MCH 30.8 30.7  MCHC 36.5* 36.4*  RDW 11.6 11.5  LYMPHSABS 2.2  --   MONOABS 0.3  --   EOSABS 0.1  --   BASOSABS 0.0  --     Chemistries   Recent Labs Lab 06/10/13 2206 06/11/13 0235 06/11/13 0453 06/11/13 0645  NA 133* 137 136* 135*  K 3.8 3.5* 3.6* 3.9  CL 89* 103 104 102  CO2 16* 17* 19 20  GLUCOSE 379* 219* 120* 113*  BUN 7 5* 4* 5*  CREATININE 0.33* 0.27* 0.26* 0.30*  CALCIUM 10.2 8.6 8.7 8.7  AST 20  --   --   --   ALT 31  --   --   --   ALKPHOS 146*  --   --   --   BILITOT 0.4  --   --   --    ------------------------------------------------------------------------------------------------------------------ estimated creatinine clearance is 91.9 ml/min (by C-G formula based on Cr of 0.3). ------------------------------------------------------------------------------------------------------------------ No results found for this basename: HGBA1C,  in the last 72 hours ------------------------------------------------------------------------------------------------------------------ No results found for this basename: CHOL, HDL, LDLCALC, TRIG, CHOLHDL, LDLDIRECT,  in the last 72 hours ------------------------------------------------------------------------------------------------------------------ No results found for this basename:  TSH, T4TOTAL, FREET3, T3FREE, THYROIDAB,  in the last 72 hours ------------------------------------------------------------------------------------------------------------------ No results found for this basename: VITAMINB12, FOLATE, FERRITIN, TIBC, IRON, RETICCTPCT,  in the last 72 hours  Coagulation profile No results found for this basename: INR, PROTIME,  in the last 168 hours  No results found for this basename: DDIMER,  in the last 72 hours  Cardiac Enzymes  Recent Labs Lab 06/10/13 2216 06/11/13 0235  TROPONINI <0.30 <0.30   ------------------------------------------------------------------------------------------------------------------ No components found with this basename: POCBNP,  Time Spent in minutes   35   SINGH,PRASHANT K M.D on 06/11/2013 at 9:57 AM  Between 7am to 7pm - Pager - [phone]  After 7pm go to [email or web address] - password [email or web address]  And look for the night coverage person covering for me after hours  [facility] Office  [phone]

## 2013-06-11 NOTE — Progress Notes (Signed)
UR completed 

## 2013-06-11 NOTE — H&P (Addendum)
[facility] History and Physical  Kathryn Cosslicia N Hoffer ZOX:[id number] DOB: 10-03-1990 DOA: 06/10/2013  Referring physician:  EDP PCP: Alva GarnetSHELTON,KIMBERLY R., MD  Specialists:   Chief Complaint:   Chest Pain  HPI: [name] is a 23 y.o. female with type 1 DM since age of 23 who presents tot he ED with complaints of substernal area chest pain and nausea, and body aches since the AM.   She reports having Glucose levels in the 370s at home.   She was evaluated by Cardiologist Dr Graciela HusbandsKlein 2 days ago on 03/11 for Chest pain and had a negative cardiac workup.   She was evaluated in the ED and found to be in DKA and was started on the glucostabilizer, and referred for medical admission.      Review of Systems:  Constitutional: No Weight Loss, No Weight Gain, Night Sweats, Fevers, Chills, Fatigue, or Generalized Weakness HEENT: No Headaches, Difficulty Swallowing,Tooth/Dental Problems,Sore Throat,  No Sneezing, Rhinitis, Ear Ache, Nasal Congestion, or Post Nasal Drip,  Cardio-vascular: +Chest pain, Orthopnea, PND, Edema in lower extremities, Anasarca, Dizziness, Palpitations  Resp: No Dyspnea, No DOE, No Productive Cough, No Non-Productive Cough, No Hemoptysis, No Change in Color of Mucus,  No Wheezing.    GI: No Heartburn, Indigestion, Abdominal Pain, +Nausea, Vomiting, Diarrhea, Change in Bowel Habits,  Loss of Appetite  GU: No Dysuria, Change in Color of Urine, No Urgency or Frequency.  No flank pain.  Musculoskeletal: + Myalgias, No Joint Pain or Swelling.  No Decreased Range of Motion. No Back Pain.  Neurologic: No Syncope, No Seizures, Muscle Weakness, Paresthesia, Vision Disturbance or Loss, No Diplopia, No Vertigo, No Difficulty Walking,  Skin: No Rash or Lesions. Psych: No Change in Mood or Affect. No Depression or Anxiety. No Memory loss. No Confusion or Hallucinations   Past Medical History  Diagnosis Date  . Diabetes mellitus   . Hypertension   . Diabetes type 1, uncontrolled  11/14/2011    Since age 23  . Gastroparesis       Past Surgical History  Procedure Laterality Date  . Ankle surgery    . Cholecystectomy  11/15/2011    Procedure: LAPAROSCOPIC CHOLECYSTECTOMY WITH INTRAOPERATIVE CHOLANGIOGRAM;  Surgeon: Ardeth SportsmanSteven C. Gross, MD;  Location: [facility];  Service: General;  Laterality: N/A;  . Laparoscopy  11/23/2011    Procedure: LAPAROSCOPY DIAGNOSTIC;  Surgeon: Mariella SaaBenjamin T Hoxworth, MD;  Location: [facility];  Service: General;  Laterality: N/A;  . Esophagogastroduodenoscopy  12/03/2011    Procedure: ESOPHAGOGASTRODUODENOSCOPY (EGD);  Surgeon: Theda BelfastPatrick D Hung, MD;  [facility];  Service: Endoscopy;  Laterality: N/A;       Prior to Admission medications   Medication Sig Start Date End Date Taking? Authorizing Provider  aspirin-acetaminophen-caffeine (EXCEDRIN MIGRAINE) 6062674030250-250-65 MG per tablet Take 2 tablets by mouth every 6 (six) hours as needed for headache.   Yes Historical Provider, MD  cyclobenzaprine (FLEXERIL) 10 MG tablet Take 10 mg by mouth 3 (three) times daily as needed for muscle spasms. 05/21/13  Yes Ward GivensIva L Knapp, MD  DULoxetine (CYMBALTA) 30 MG capsule Take 30 mg by mouth at bedtime.   Yes Historical Provider, MD  gabapentin (NEURONTIN) 400 MG capsule Take 2 capsules (800 mg total) by mouth 4 (four) times daily. 05/04/13  Yes Ronal FearLynn E Lam, NP  insulin glargine (LANTUS) 100 UNIT/ML injection Inject 40 Units into the skin at bedtime.   Yes Historical Provider, MD  insulin lispro (HUMALOG) 100 UNIT/ML injection Inject 5-15 Units into the skin 3 (  three) times daily with meals. Inject 5 units (base) and additional units according to sliding scale- sliding scale: CBG 70 - 120: 0 units: CBG 121 - 150: 2 units; CBG 151 - 200: 3 units; CBG 201 - 250: 5 units; CBG 251 - 300: 8 units; CBG 301 - 350: 11 units; CBG 351 - 400: 15 units; CBG > 400 Call MD   Yes Historical Provider, MD  metoCLOPramide (REGLAN) 10 MG tablet Take 10 mg by mouth 3 (three) times daily as  needed for nausea. Takes with each meal only as needed for nausea. 03/16/12  Yes Srikar Cherlynn Kaiser, MD  oxyCODONE-acetaminophen (PERCOCET) 5-325 MG per tablet Take 1-2 tablets by mouth every 4 (four) hours as needed for severe pain. 05/29/13  Yes [name], MD  promethazine (PHENERGAN) 25 MG tablet Take 25 mg by mouth every 6 (six) hours as needed for nausea or vomiting. For nausea   Yes Historical Provider, MD  traMADol (ULTRAM) 50 MG tablet Take 1 tablet by mouth every 6 (six) hours as needed for moderate pain. pain 05/26/13  Yes Historical Provider, MD      Allergies  Allergen Reactions  . Peanut-Containing Drug Products Anaphylaxis    [place] nuts specifically       Social History:  reports that she has never smoked. She has never used smokeless tobacco. She reports that she drinks alcohol. She reports that she does not use illicit drugs.     Family History  Problem Relation Age of Onset  . Diabetes Mother   . Hypertension Father        Physical Exam:  GEN:  Pleasant  23 y.o. female  examined  and in no acute distress; cooperative with exam Filed Vitals:   06/10/13 2132 06/10/13 2345 06/11/13 0030 06/11/13 0045  BP: 141/102     Pulse: 129     Temp: 98 F (36.7 C)     TempSrc: Oral     Resp: 22 21 15 12   Height: 5\' 7"  (1.702 m)     Weight: 54.432 kg (120 lb)     SpO2: 100%      Blood pressure 141/102, pulse 129, temperature 98 F (36.7 C), temperature source Oral, resp. rate 12, height 5\' 7"  (1.702 m), weight 54.432 kg (120 lb), last menstrual period 06/04/2013, SpO2 100.00%. PSYCH: She is alert and oriented x4; does not appear anxious does not appear depressed; affect is normal HEENT: Normocephalic and Atraumatic, Mucous membranes pink; PERRLA; EOM intact; Fundi:  Benign;  No scleral icterus, Nares: Patent, Oropharynx: Clear, Fair Dentition, Neck:  FROM, no cervical lymphadenopathy nor thyromegaly or carotid bruit; no JVD; Breasts:: Not examined CHEST WALL: No  tenderness CHEST: Normal respiration, clear to auscultation bilaterally HEART: Regular rate and rhythm; no murmurs rubs or gallops BACK: No kyphosis or scoliosis; no CVA tenderness ABDOMEN: Positive Bowel Sounds, Scaphoid, soft non-tender; no masses, no organomegaly. Rectal Exam: Not done EXTREMITIES: No cyanosis, clubbing or edema; no ulcerations. Genitalia: not examined PULSES: 2+ and symmetric SKIN: Normal hydration no rash or ulceration CNS:  Alert and Oriented X 4, No focal Deficits Vascular: pulses palpable throughout    Labs on Admission:  Basic Metabolic Panel:  Recent Labs Lab 06/10/13 2206  NA 133*  K 3.8  CL 89*  CO2 16*  GLUCOSE 379*  BUN 7  CREATININE 0.33*  CALCIUM 10.2   Liver Function Tests:  Recent Labs Lab 06/10/13 2206  AST 20  ALT 31  ALKPHOS 146*  BILITOT 0.4  PROT 8.6*  ALBUMIN 4.4   No results found for this basename: LIPASE, AMYLASE,  in the last 168 hours No results found for this basename: AMMONIA,  in the last 168 hours CBC:  Recent Labs Lab 06/10/13 2206  WBC 5.9  NEUTROABS 3.3  HGB 14.4  HCT 39.4  MCV 84.4  PLT 254   Cardiac Enzymes:  Recent Labs Lab 06/10/13 2216  TROPONINI <0.30    BNP (last 3 results) No results found for this basename: PROBNP,  in the last 8760 hours CBG:  Recent Labs Lab 06/10/13 2142 06/11/13 0021 06/11/13 0050  GLUCAP 393* 301* 269*    Radiological Exams on Admission: Dg Chest 2 View  06/10/2013   CLINICAL DATA:  Chest pain, tachycardia  EXAM: CHEST  2 VIEW  COMPARISON:  05/25/2013  FINDINGS: The heart size and mediastinal contours are within normal limits. Both lungs are clear. The visualized skeletal structures are unremarkable.  IMPRESSION: No active cardiopulmonary disease.   Electronically Signed   By: Marlan Palau M.D.   On: 06/10/2013 23:19      EKG: Independently reviewed.  Sinus Tachycardia at rate of 135     Assessment/Plan:   23 y.o. female with  Principal  Problem:   DKA, type 1 Active Problems:   Gastroparesis   Chest pain   Sinus tachycardia   1.   DKA in Type 1 Diabetic-   Placed on the DKA Protocol with IV Insulin drip, and IVFs,  AG = 28.   Monitor Electrolytes and Glucose levels.   Transition to her Home Insulin dose and SSI coverage for Sensitive.     2.    Chest Pain-  Cycle Troponin, monitor on Telemetry,  Previous Cardiac Workup Negative.     3.    Sinus Tachycardia -  Due to Dehydration - should improve with IVFs,  Check TSH level.    4.    Dehydration due to #1-   IVFs to rehydrate.     5.    Gastroparesis-   Reglan Rx.     6.  DVT prophylaxis with Lovenox.          Code Status:      FULL CODE Family Communication:    Family at Bedside Disposition Plan:      Inpatient  Time spent:  9 Minutes  Ron Parker [facility] Pager [phone]  If 7PM-7AM, please contact night-coverage [email or web address] Password [email or web address] 06/11/2013, 1:24 AM

## 2013-06-12 LAB — BASIC METABOLIC PANEL WITH GFR
BUN: 6 mg/dL (ref 6–23)
CO2: 26 meq/L (ref 19–32)
Calcium: 8.6 mg/dL (ref 8.4–10.5)
Chloride: 100 meq/L (ref 96–112)
Creatinine, Ser: 0.31 mg/dL — ABNORMAL LOW (ref 0.50–1.10)
GFR calc Af Amer: >90 mL/min (ref >90)
GFR calc non Af Amer: >90 mL/min (ref >90)
Glucose, Bld: 209 mg/dL — ABNORMAL HIGH (ref 70–99)
Potassium: 3.6 meq/L — ABNORMAL LOW (ref 3.7–5.3)
Sodium: 137 meq/L (ref 137–147)

## 2013-06-12 LAB — MAGNESIUM: Magnesium: 1.6 mg/dL (ref 1.5–2.5)

## 2013-06-12 LAB — GLUCOSE, CAPILLARY: Glucose-Capillary: 185 mg/dL — ABNORMAL HIGH (ref 70–99)

## 2013-06-12 MED ORDER — POTASSIUM CHLORIDE CRYS ER 20 MEQ PO TBCR
40.0000 meq | EXTENDED_RELEASE_TABLET | Freq: Once | ORAL | Status: AC
  Administered 2013-06-12: 40 meq via ORAL
  Filled 2013-06-12: qty 2

## 2013-06-12 MED ORDER — INSULIN GLARGINE 100 UNIT/ML ~~LOC~~ SOLN: 40.0000 [IU] | Freq: Every day | SUBCUTANEOUS | Status: DC

## 2013-06-12 MED ORDER — INSULIN LISPRO 100 UNIT/ML ~~LOC~~ SOLN: 5.0000 [IU] | Freq: Three times a day (TID) | SUBCUTANEOUS | Status: DC

## 2013-06-12 NOTE — Discharge Instructions (Signed)
Follow with Primary MD Salena Saner., MD in 7 days   Get CBC, CMP, checked 7 days by Primary MD and again as instructed by your Primary MD. Get a 2 view Chest X ray done next visit if you had Pneumonia of Lung problems at the [place] 4 times/day, Once in AM empty stomach and then before each meal. Log in all results and show them to your Prim.MD in 3 days. If any glucose reading is under 80 or above 300 call your Prim MD immidiately. Follow Low glucose instructions for glucose under 80 as instructed.   Activity: As tolerated with Full fall precautions use walker/cane & assistance as needed   Disposition Home     Diet: Heart Healthy - low carb   For Heart failure patients - Check your Weight same time everyday, if you gain over 2 pounds, or you develop in leg swelling, experience more shortness of breath or chest pain, call your Primary MD immediately. Follow Cardiac Low Salt Diet and 1.8 lit/day fluid restriction.   On your next visit with her primary care physician please Get Medicines reviewed and adjusted.  Please request your Prim.MD to go over all Hospital Tests and Procedure/Radiological results at the follow up, please get all Hospital records sent to your Prim MD by signing hospital release before you go home.   If you experience worsening of your admission symptoms, develop shortness of breath, life threatening emergency, suicidal or homicidal thoughts you must seek medical attention immediately by calling 911 or calling your MD immediately  if symptoms less severe.  You Must read complete instructions/literature along with all the possible adverse reactions/side effects for all the Medicines you take and that have been prescribed to you. Take any new Medicines after you have completely understood and accpet all the possible adverse reactions/side effects.   Do not drive and provide baby sitting services if your were admitted for syncope or siezures until  you have seen by Primary MD or a Neurologist and advised to do so again.  Do not drive when taking Pain medications.    Do not take more than prescribed Pain, Sleep and Anxiety Medications  Special Instructions: If you have smoked or chewed Tobacco  in the last 2 yrs please stop smoking, stop any regular Alcohol  and or any Recreational drug use.  Wear Seat belts while driving.   Please note  You were cared for by a hospitalist during your hospital stay. If you have any questions about your discharge medications or the care you received while you were in the hospital after you are discharged, you can call the unit and asked to speak with the hospitalist on call if the hospitalist that took care of you is not available. Once you are discharged, your primary care physician will handle any further medical issues. Please note that NO REFILLS for any discharge medications will be authorized once you are discharged, as it is imperative that you return to your primary care physician (or establish a relationship with a primary care physician if you do not have one) for your aftercare needs so that they can reassess your need for medications and monitor your lab values.

## 2013-06-12 NOTE — Progress Notes (Signed)
   CARE MANAGEMENT NOTE 06/12/2013  Patient:  [name], [name]   Account Number:  [id number]  Date Initiated:  06/12/2013  Documentation initiated by:  [facility]  Subjective/Objective Assessment:   DM     Action/Plan:   Anticipated DC Date:  06/12/2013   Anticipated DC Plan:  HOME/SELF CARE      DC Planning Services  CM consult  Medication Assistance      Choice offered to / List presented to:             Status of service:  Completed, signed off Medicare Important Message given?   (If response is "NO", the following Medicare IM given date fields will be blank) Date Medicare IM given:   Date Additional Medicare IM given:    Discharge Disposition:  HOME/SELF CARE  Per UR Regulation:    If discussed at Long Length of Stay Meetings, dates discussed:    Comments:  06/12/2013 1445 NCM has insurance coverage for her medications. Isidoro Donning RN CCM Case Mgmt phone [phone]

## 2013-06-12 NOTE — Progress Notes (Signed)
Has been noted to be eating frequently and noted to have food wrappers on her bedside table from outside hospital sources.  Education and reinforcement of DM diet and pt able to verbalize understanding and denies further questions.  HS cbg was 279 and covered with ssi.   Continues to c/o of nausea and leg pain and was noted to be sleeping after being given tylenol and zofran.  No emesis, abd assessment wnl, ble cms and pulses wnl, negative homan's sign.  vss.  Have reinforced MD order for oob and walking halls qs.  Pt did sit in chair for an hour but refusing to walk halls.  Education provided.  Will continue to monitor.

## 2013-06-12 NOTE — Discharge Summary (Signed)
[name], is a 23 y.o. female  DOB 06-15-1990  MRN [id number].  Admission date:  06/10/2013  Admitting Physician  Theressa Millard, MD  Discharge Date:  06/12/2013   Primary MD  Salena Saner., MD  Recommendations for primary care physician for things to follow:   Please follow A1c and glycemic control closely adjust insulin dose as needed   Admission Diagnosis  Gastroparesis [536.3] Sinus tachycardia [427.89] DKA (diabetic ketoacidoses) [250.10] Tachycardia [785.0] Body aches [780.96] Diabetes mellitus type 1 [250.01] Chest pain [786.50]   Discharge Diagnosis  Gastroparesis [536.3] Sinus tachycardia [427.89] DKA (diabetic ketoacidoses) [250.10] Tachycardia [785.0] Body aches [780.96] Diabetes mellitus type 1 [250.01] Chest pain [786.50]     Principal Problem:   DKA, type 1 Active Problems:   Gastroparesis   Chest pain   Sinus tachycardia      Past Medical History  Diagnosis Date  . Diabetes mellitus   . Hypertension   . Diabetes type 1, uncontrolled 11/14/2011    Since age 33  . Gastroparesis     Past Surgical History  Procedure Laterality Date  . Ankle surgery    . Cholecystectomy  11/15/2011    Procedure: LAPAROSCOPIC CHOLECYSTECTOMY WITH INTRAOPERATIVE CHOLANGIOGRAM;  Surgeon: [name], MD;  Location: [facility];  Service: General;  Laterality: N/A;  . Laparoscopy  11/23/2011    Procedure: LAPAROSCOPY DIAGNOSTIC;  Surgeon: Edward Jolly, MD;  Location: [facility];  Service: General;  Laterality: N/A;  . Esophagogastroduodenoscopy  12/03/2011    Procedure: ESOPHAGOGASTRODUODENOSCOPY (EGD);  Surgeon: Beryle Beams, MD;  Location: [facility];  Service: Endoscopy;  Laterality: N/A;     Discharge Condition: stable   Follow UP  Follow-up Information   Follow up with Salena Saner., MD. Schedule an appointment as soon as possible for a visit in 1 week.   Specialty:  Internal Medicine   Contact information:   [address] [place] [address] [phone]         Discharge Instructions  and  Discharge Medications      Discharge Orders   Future Appointments Provider Department Dept Phone   06/30/2013 1:30 PM Penni Bombard, [facility] [phone]   09/08/2013 11:30 AM Deboraha Sprang, MD [facility] [phone]   Future Orders Complete By Expires   Discharge instructions  As directed    Comments:     Follow with Primary MD Salena Saner., MD in 7 days   Get CBC, CMP, checked 7 days by Primary MD and again as instructed by your Primary MD. Get a 2 view Chest X ray done next visit if you had Pneumonia of Lung problems at the [place] 4 times/day, Once in AM empty stomach and then before each meal. Log in all results and show them to your Prim.MD in 3 days. If any glucose reading is under 80 or above 300 call your Prim MD immidiately. Follow Low glucose instructions for glucose under 80 as instructed.  Activity: As tolerated with Full fall precautions use walker/cane & assistance as needed   Disposition Home     Diet: Heart Healthy - low carb   For Heart failure patients - Check your Weight same time everyday, if you gain over 2 pounds, or you develop in leg swelling, experience more shortness of breath or chest pain, call your Primary MD immediately. Follow Cardiac Low Salt Diet and 1.8 lit/day fluid restriction.   On your next visit with her primary care physician please Get Medicines reviewed and adjusted.  Please request your Prim.MD to go over all Hospital Tests and Procedure/Radiological results at the follow up, please get all Hospital records sent to your Prim MD by signing hospital release before you go home.   If you experience worsening of your admission  symptoms, develop shortness of breath, life threatening emergency, suicidal or homicidal thoughts you must seek medical attention immediately by calling 911 or calling your MD immediately  if symptoms less severe.  You Must read complete instructions/literature along with all the possible adverse reactions/side effects for all the Medicines you take and that have been prescribed to you. Take any new Medicines after you have completely understood and accpet all the possible adverse reactions/side effects.   Do not drive and provide baby sitting services if your were admitted for syncope or siezures until you have seen by Primary MD or a Neurologist and advised to do so again.  Do not drive when taking Pain medications.    Do not take more than prescribed Pain, Sleep and Anxiety Medications  Special Instructions: If you have smoked or chewed Tobacco  in the last 2 yrs please stop smoking, stop any regular Alcohol  and or any Recreational drug use.  Wear Seat belts while driving.   Please note  You were cared for by a hospitalist during your hospital stay. If you have any questions about your discharge medications or the care you received while you were in the hospital after you are discharged, you can call the unit and asked to speak with the hospitalist on call if the hospitalist that took care of you is not available. Once you are discharged, your primary care physician will handle any further medical issues. Please note that NO REFILLS for any discharge medications will be authorized once you are discharged, as it is imperative that you return to your primary care physician (or establish a relationship with a primary care physician if you do not have one) for your aftercare needs so that they can reassess your need for medications and monitor your lab values.   Increase activity slowly  As directed        Medication List    STOP taking these medications       oxyCODONE-acetaminophen 5-325  MG per tablet  Commonly known as:  PERCOCET      TAKE these medications       aspirin-acetaminophen-caffeine 417-408-14 MG per tablet  Commonly known as:  EXCEDRIN MIGRAINE  Take 2 tablets by mouth every 6 (six) hours as needed for headache.     cyclobenzaprine 10 MG tablet  Commonly known as:  FLEXERIL  Take 10 mg by mouth 3 (three) times daily as needed for muscle spasms.     DULoxetine 30 MG capsule  Commonly known as:  CYMBALTA  Take 30 mg by mouth at bedtime.     gabapentin 400 MG capsule  Commonly known as:  NEURONTIN  Take 2 capsules (800 mg total) by  mouth 4 (four) times daily.     insulin glargine 100 UNIT/ML injection  Commonly known as:  LANTUS  Inject 0.4 mLs (40 Units total) into the skin at bedtime.     insulin lispro 100 UNIT/ML injection  Commonly known as:  HUMALOG  Inject 5-15 Units into the skin 3 (three) times daily with meals. Inject 5 units (base) and additional units according to sliding scale- sliding scale: CBG 70 - 120: 0 units: CBG 121 - 150: 2 units; CBG 151 - 200: 3 units; CBG 201 - 250: 5 units; CBG 251 - 300: 8 units; CBG 301 - 350: 11 units; CBG 351 - 400: 15 units; CBG > 400 Call MD     metoCLOPramide 10 MG tablet  Commonly known as:  REGLAN  Take 10 mg by mouth 3 (three) times daily as needed for nausea. Takes with each meal only as needed for nausea.     promethazine 25 MG tablet  Commonly known as:  PHENERGAN  Take 25 mg by mouth every 6 (six) hours as needed for nausea or vomiting. For nausea     traMADol 50 MG tablet  Commonly known as:  ULTRAM  Take 1 tablet by mouth every 6 (six) hours as needed for moderate pain. pain          Diet and Activity recommendation: See Discharge Instructions above   Consults obtained - Case Manager   Major procedures and Radiology Reports - PLEASE review detailed and final reports for all details, in brief -       Dg Chest 2 View  06/10/2013   CLINICAL DATA:  Chest pain, tachycardia  EXAM:  CHEST  2 VIEW  COMPARISON:  05/25/2013  FINDINGS: The heart size and mediastinal contours are within normal limits. Both lungs are clear. The visualized skeletal structures are unremarkable.  IMPRESSION: No active cardiopulmonary disease.   Electronically Signed   By: Franchot Gallo M.D.   On: 06/10/2013 23:19   Dg Chest 2 View  05/23/2013   CLINICAL DATA:  CHEST PAIN  EXAM: CHEST  2 VIEW  COMPARISON:  CT ANGIO CHEST W/CM &/OR WO/CM dated 05/22/2013; DG CHEST 2 VIEW dated 05/09/2013  FINDINGS: The heart size and mediastinal contours are within normal limits. Both lungs are clear. The visualized skeletal structures are unremarkable.  IMPRESSION: No active cardiopulmonary disease.   Electronically Signed   By: Margaree Mackintosh M.D.   On: 05/23/2013 13:05   Ct Angio Chest Pe W/cm &/or Wo Cm  05/22/2013   CLINICAL DATA:  Multiple episodes of chest pain in chest tightness with multiple ED visits, history diabetes, hypertension  EXAM: CT ANGIOGRAPHY CHEST WITH CONTRAST  TECHNIQUE: Multidetector CT imaging of the chest was performed using the standard protocol during bolus administration of intravenous contrast. Multiplanar CT image reconstructions and MIPs were obtained to evaluate the vascular anatomy.  CONTRAST:  14m OMNIPAQUE IOHEXOL 350 MG/ML SOLN  COMPARISON:  02/28/2012  FINDINGS: Aorta normal caliber without aneurysm or dissection.  Residual thymic tissue in anterior mediastinum.  No thoracic adenopathy.  Visualized portion of upper abdomen normal appearance.  Pulmonary arteries well opacified and patent.  No evidence of pulmonary embolism.  Lungs clear.  No infiltrate, pleural effusion, pneumothorax or mass/nodule.  Bones unremarkable.  Review of the MIP images confirms the above findings.  IMPRESSION: Normal CTA chest.   Electronically Signed   By: MLavonia DanaM.D.   On: 05/22/2013 11:33    Micro Results  No results found for this or any previous visit (from the past 240 hour(s)).   History of  present illness and  Hospital Course:     Kindly see H&P for history of present illness and admission details, please review complete Labs, Consult reports and Test reports for all details in brief [name], is a 23 y.o. female, patient with history of  diabetes mellitus type 1 poor outpatient control, diabetic gastroparesis, chronic musculoskeletal chest pain and tachycardia being followed by Dr. Caryl Comes electrophysiologist was admitted to the hospital for generalized weakness caused by mild DKA, patient ran out of her insulin apparently due to financial issues but now says she has the capacity to get medications.   Mild DKA. Was kept on DKA protocol on IV fluids along with IV insulin drip, anion gap closed early yesterday, she was then transitioned to home dose Lantus along with pre-meal and nightly sliding scale with good effect. Glycemic control is acceptable needed she has been counseled on compliance with her diabetic diet and insulin regimen, has been advised to do Accu-Cheks q. a.c. at bedtime maintain a logbook and presented to her PCP next visit within a week. Will request PCP to continue monitor her glycemic control and adjust insulin as needed.    Her diabetic gastroparesis she is already on Reglan with good effect, she is in no distress her whatsoever, I will advise not to pursue long-term use of narcotics as she required none her and appeared comfortable only on when necessary Tylenol which she used once.    Chr. sinus tachycardia with musculoskeletal chest discomfort - she has had an unremarkable echo a few weeks ago, was seen by electrophysiologist Dr. Cleda Mccreedy few days ago, troponin EKG unremarkable. She is symptom free since yesterday. No further cardiac workup outpatient followup with electrophysiology for nonspecific sinus tachycardia which is chronic in nature.     Low potassium this morning will be replaced prior to discharge.     Today   Subjective:   [name]  today has no headache,no chest abdominal pain,no new weakness tingling or numbness, feels much better wants to go home today.    Objective:   Blood pressure 115/75, pulse 102, temperature 97.8 F (36.6 C), temperature source Oral, resp. rate 18, height 5' 7"  (1.702 m), weight 53.2 kg (117 lb 4.6 oz), last menstrual period 06/04/2013, SpO2 100.00%.   Intake/Output Summary (Last 24 hours) at 06/12/13 0754 Last data filed at 06/12/13 0500  Gross per 24 hour  Intake   2760 ml  Output   2650 ml  Net    110 ml    Exam Awake Alert, Oriented *3, No new F.N deficits, Normal affect [place].AT,PERRAL Supple Neck,No JVD, No cervical lymphadenopathy appriciated.  Symmetrical Chest wall movement, Good air movement bilaterally, CTAB RRR,No Gallops,Rubs or new Murmurs, No Parasternal Heave +ve B.Sounds, Abd Soft, Non tender, No organomegaly appriciated, No rebound -guarding or rigidity. No Cyanosis, Clubbing or edema, No new Rash or bruise  Data Review   CBC w Diff: Lab Results  Component Value Date   WBC 6.2 06/11/2013   HGB 12.3 06/11/2013   HCT 33.8* 06/11/2013   PLT 228 06/11/2013   LYMPHOPCT 38 06/10/2013   MONOPCT 5 06/10/2013   EOSPCT 1 06/10/2013   BASOPCT 1 06/10/2013    CMP: Lab Results  Component Value Date   NA 137 06/12/2013   K 3.6* 06/12/2013   CL 100 06/12/2013   CO2 26 06/12/2013   BUN 6 06/12/2013  CREATININE 0.31* 06/12/2013   CREATININE 0.68 04/15/2012   PROT 8.6* 06/10/2013   ALBUMIN 4.4 06/10/2013   BILITOT 0.4 06/10/2013   ALKPHOS 146* 06/10/2013   AST 20 06/10/2013   ALT 31 06/10/2013  .  Lab Results  Component Value Date   HGBA1C 11.4* 06/11/2013     Total Time in preparing paper work, data evaluation and todays exam - 35 minutes  Thurnell Lose M.D on 06/12/2013 at 7:54 AM  [facility] Office  [phone]

## 2013-06-14 NOTE — ED Provider Notes (Signed)
Medical screening examination/treatment/procedure(s) were performed by non-physician practitioner and as supervising physician I was immediately available for consultation/collaboration.   EKG Interpretation   Date/Time:  Friday June 10 2013 21:39:14 EDT Ventricular Rate:  135 PR Interval:  114 QRS Duration: 77 QT Interval:  405 QTC Calculation: 607 R Axis:   76 Text Interpretation:  Sinus tachycardia LAE, consider biatrial enlargement  Prolonged QT interval No significant change since last tracing ED  PHYSICIAN INTERPRETATION AVAILABLE IN [facility] [place] Confirmed by TEST,  Record ([id number]) on 06/12/2013 12:00:50 PM       Virgel Manifold, MD 06/14/13 2237

## 2013-06-15 ENCOUNTER — Emergency Department (HOSPITAL_COMMUNITY)
Admission: EM | Admit: 2013-06-15 | Discharge: 2013-06-16 | Disposition: A | Discharge status: Home / Self Care | Payer: 59 | Attending: Emergency Medicine | Admitting: Emergency Medicine

## 2013-06-15 ENCOUNTER — Encounter (HOSPITAL_COMMUNITY): Payer: Self-pay | Admitting: Emergency Medicine

## 2013-06-15 DIAGNOSIS — Z794 Long term (current) use of insulin: Secondary | ICD-10-CM | POA: Insufficient documentation

## 2013-06-15 DIAGNOSIS — R000 Tachycardia, unspecified: Secondary | ICD-10-CM | POA: Insufficient documentation

## 2013-06-15 DIAGNOSIS — I10 Essential (primary) hypertension: Secondary | ICD-10-CM | POA: Insufficient documentation

## 2013-06-15 DIAGNOSIS — E1069 Type 1 diabetes mellitus with other specified complication: Secondary | ICD-10-CM

## 2013-06-15 DIAGNOSIS — M791 Myalgia, unspecified site: Secondary | ICD-10-CM

## 2013-06-15 DIAGNOSIS — E860 Dehydration: Secondary | ICD-10-CM | POA: Insufficient documentation

## 2013-06-15 DIAGNOSIS — R52 Pain, unspecified: Secondary | ICD-10-CM | POA: Insufficient documentation

## 2013-06-15 DIAGNOSIS — IMO0001 Reserved for inherently not codable concepts without codable children: Secondary | ICD-10-CM | POA: Insufficient documentation

## 2013-06-15 DIAGNOSIS — R079 Chest pain, unspecified: Secondary | ICD-10-CM

## 2013-06-15 DIAGNOSIS — E162 Hypoglycemia, unspecified: Secondary | ICD-10-CM

## 2013-06-15 DIAGNOSIS — IMO0002 Reserved for concepts with insufficient information to code with codable children: Secondary | ICD-10-CM | POA: Insufficient documentation

## 2013-06-15 DIAGNOSIS — Z79899 Other long term (current) drug therapy: Secondary | ICD-10-CM | POA: Insufficient documentation

## 2013-06-15 DIAGNOSIS — Z8719 Personal history of other diseases of the digestive system: Secondary | ICD-10-CM | POA: Insufficient documentation

## 2013-06-15 DIAGNOSIS — E1065 Type 1 diabetes mellitus with hyperglycemia: Secondary | ICD-10-CM | POA: Insufficient documentation

## 2013-06-15 NOTE — ED Notes (Signed)
Pt states she started having chest pain 3 hours ago and general body aches 4 hrs ago  Pt states the chest pain is radiating to her right shoulder  Pt states if feels like her heart is beating too hard

## 2013-06-16 ENCOUNTER — Emergency Department (HOSPITAL_COMMUNITY)
Admission: EM | Admit: 2013-06-16 | Discharge: 2013-06-17 | Disposition: A | Discharge status: Home / Self Care | Payer: 59 | Attending: Emergency Medicine | Admitting: Emergency Medicine

## 2013-06-16 ENCOUNTER — Encounter (HOSPITAL_COMMUNITY): Payer: Self-pay | Admitting: Emergency Medicine

## 2013-06-16 DIAGNOSIS — R000 Tachycardia, unspecified: Secondary | ICD-10-CM | POA: Insufficient documentation

## 2013-06-16 DIAGNOSIS — I10 Essential (primary) hypertension: Secondary | ICD-10-CM | POA: Insufficient documentation

## 2013-06-16 DIAGNOSIS — Z794 Long term (current) use of insulin: Secondary | ICD-10-CM | POA: Insufficient documentation

## 2013-06-16 DIAGNOSIS — Z79899 Other long term (current) drug therapy: Secondary | ICD-10-CM | POA: Insufficient documentation

## 2013-06-16 DIAGNOSIS — IMO0001 Reserved for inherently not codable concepts without codable children: Secondary | ICD-10-CM | POA: Insufficient documentation

## 2013-06-16 DIAGNOSIS — IMO0002 Reserved for concepts with insufficient information to code with codable children: Secondary | ICD-10-CM | POA: Insufficient documentation

## 2013-06-16 DIAGNOSIS — M791 Myalgia, unspecified site: Secondary | ICD-10-CM

## 2013-06-16 DIAGNOSIS — Z791 Long term (current) use of non-steroidal anti-inflammatories (NSAID): Secondary | ICD-10-CM | POA: Insufficient documentation

## 2013-06-16 DIAGNOSIS — E1065 Type 1 diabetes mellitus with hyperglycemia: Secondary | ICD-10-CM | POA: Insufficient documentation

## 2013-06-16 DIAGNOSIS — Z8719 Personal history of other diseases of the digestive system: Secondary | ICD-10-CM | POA: Insufficient documentation

## 2013-06-16 LAB — BASIC METABOLIC PANEL
BUN: 11 mg/dL (ref 6–23)
CO2: 28 mEq/L (ref 19–32)
Calcium: 9.4 mg/dL (ref 8.4–10.5)
Chloride: 105 mEq/L (ref 96–112)
Creatinine, Ser: 0.33 mg/dL — ABNORMAL LOW (ref 0.50–1.10)
GFR calc Af Amer: >90 mL/min (ref >90)
GFR calc non Af Amer: >90 mL/min (ref >90)
Glucose, Bld: 42 mg/dL — CL (ref 70–99)
Potassium: 3.3 mEq/L — ABNORMAL LOW (ref 3.7–5.3)
Sodium: 145 mEq/L (ref 137–147)

## 2013-06-16 LAB — CBG MONITORING, ED
Glucose-Capillary: 132 mg/dL — ABNORMAL HIGH (ref 70–99)
Glucose-Capillary: 28 mg/dL — CL (ref 70–99)
Glucose-Capillary: 45 mg/dL — ABNORMAL LOW (ref 70–99)
Glucose-Capillary: 83 mg/dL (ref 70–99)

## 2013-06-16 LAB — CBC
HCT: 32.1 % — ABNORMAL LOW (ref 36.0–46.0)
Hemoglobin: 10.7 g/dL — ABNORMAL LOW (ref 12.0–15.0)
MCH: 30.1 pg (ref 26.0–34.0)
MCHC: 33.3 g/dL (ref 30.0–36.0)
MCV: 90.2 fL (ref 78.0–100.0)
Platelets: 293 10*3/uL (ref 150–400)
RBC: 3.56 MIL/uL — ABNORMAL LOW (ref 3.87–5.11)
RDW: 11.8 % (ref 11.5–15.5)
WBC: 8.3 10*3/uL (ref 4.0–10.5)

## 2013-06-16 LAB — CK: Total CK: 81 U/L (ref 7–177)

## 2013-06-16 MED ORDER — KETOROLAC TROMETHAMINE 30 MG/ML IJ SOLN
30.0000 mg | Freq: Once | INTRAMUSCULAR | Status: AC
  Administered 2013-06-16: 30 mg via INTRAVENOUS
  Filled 2013-06-16: qty 1

## 2013-06-16 MED ORDER — NAPROXEN 500 MG PO TABS: 500.0000 mg | ORAL_TABLET | Freq: Two times a day (BID) | ORAL | Status: DC

## 2013-06-16 MED ORDER — DEXTROSE 50 % IV SOLN
1.0000 | Freq: Once | INTRAVENOUS | Status: AC
  Administered 2013-06-16: 50 mL via INTRAVENOUS

## 2013-06-16 MED ORDER — DEXTROSE 50 % IV SOLN
INTRAVENOUS | Status: AC
  Administered 2013-06-16: 50 mL via INTRAVENOUS
  Filled 2013-06-16: qty 50

## 2013-06-16 NOTE — ED Notes (Signed)
Pt A&Ox4, no acute distress - given orange juice d/t hypoglycemia.

## 2013-06-16 NOTE — ED Notes (Signed)
Pt also provided w/ graham crackers and peanut butter.

## 2013-06-16 NOTE — ED Provider Notes (Signed)
CSN: [id number]     Arrival date & time 06/15/13  2321 History   First MD Initiated Contact with Patient 06/16/13 0106     Chief Complaint  Patient presents with  . Chest Pain     (Consider location/radiation/quality/duration/timing/severity/associated sxs/prior Treatment) HPI Comments: 23 year old female with a history of diabetes, multiple emergency department visits for chest pain, body aches and myalgias. She presents with a complaint of chest pain that started at 7:30 this evening. She states that she has been having her daily body aches for which she is under the care of a neurologist and for which he does not carry a diagnosis. This evening chest pain started as a heaviness in the chest, radiates to the right shoulder, is the same as the prior chest pain that she has had and for which she has been evaluated by cardiology. She has been told that she has a mild tachycardia which is normal for her, they are unsure of the exact etiology of this however her chest pain and tachycardia has been evaluated as recently as one month ago with CT angiogram of the chest. This chest pain is persistent, worse with sitting up, better with lying down and not associated with shortness of breath, fever, chills, nausea, vomiting, lower extremities swelling. She has no diarrhea. She has had her insulin this evening but has had very little to eat today.  Patient is a 23 y.o. female presenting with chest pain. The history is provided by the patient and medical records.  Chest Pain   Past Medical History  Diagnosis Date  . Diabetes mellitus   . Hypertension   . Diabetes type 1, uncontrolled 11/14/2011    Since age 22  . Gastroparesis    Past Surgical History  Procedure Laterality Date  . Ankle surgery    . Cholecystectomy  11/15/2011    Procedure: LAPAROSCOPIC CHOLECYSTECTOMY WITH INTRAOPERATIVE CHOLANGIOGRAM;  Surgeon: [name], MD;  Location: [facility];  Service: General;  Laterality: N/A;  .  Laparoscopy  11/23/2011    Procedure: LAPAROSCOPY DIAGNOSTIC;  Surgeon: Edward Jolly, MD;  Location: [facility];  Service: General;  Laterality: N/A;  . Esophagogastroduodenoscopy  12/03/2011    Procedure: ESOPHAGOGASTRODUODENOSCOPY (EGD);  Surgeon: Beryle Beams, MD;  Location: [facility];  Service: Endoscopy;  Laterality: N/A;   Family History  Problem Relation Age of Onset  . Diabetes Mother   . Hypertension Father    History  Substance Use Topics  . Smoking status: Never Smoker   . Smokeless tobacco: Never Used  . Alcohol Use: Yes     Comment: Occasional   OB History   Grav Para Term Preterm Abortions TAB SAB Ect Mult Living                 Review of Systems  Cardiovascular: Positive for chest pain.  All other systems reviewed and are negative.      Allergies  Peanut-containing drug products  Home Medications   Current Outpatient Rx  Name  Route  Sig  Dispense  Refill  . aspirin-acetaminophen-caffeine (EXCEDRIN MIGRAINE) 250-250-65 MG per tablet   Oral   Take 2 tablets by mouth every 6 (six) hours as needed for headache.         . cyclobenzaprine (FLEXERIL) 10 MG tablet   Oral   Take 10 mg by mouth 3 (three) times daily as needed for muscle spasms.         . DULoxetine (CYMBALTA) 30 MG capsule  Oral   Take 30 mg by mouth at bedtime.         . gabapentin (NEURONTIN) 400 MG capsule   Oral   Take 2 capsules (800 mg total) by mouth 4 (four) times daily.   240 capsule   5   . hydroxypropyl methylcellulose (ISOPTO TEARS) 2.5 % ophthalmic solution   Both Eyes   Place 1 drop into both eyes 3 (three) times daily as needed for dry eyes.         . insulin glargine (LANTUS) 100 UNIT/ML injection   Subcutaneous   Inject 0.4 mLs (40 Units total) into the skin at bedtime.   10 mL   11   . insulin lispro (HUMALOG) 100 UNIT/ML injection   Subcutaneous   Inject 5-15 Units into the skin 3 (three) times daily with meals. Inject 5 units (base) and  additional units according to sliding scale- sliding scale: CBG 70 - 120: 0 units: CBG 121 - 150: 2 units; CBG 151 - 200: 3 units; CBG 201 - 250: 5 units; CBG 251 - 300: 8 units; CBG 301 - 350: 11 units; CBG 351 - 400: 15 units; CBG > 400 Call MD   10 mL   11   . metoCLOPramide (REGLAN) 10 MG tablet   Oral   Take 10 mg by mouth 3 (three) times daily as needed for nausea. Takes with each meal only as needed for nausea.         . naproxen (NAPROSYN) 250 MG tablet   Oral   Take 250-500 mg by mouth 2 (two) times daily as needed for moderate pain.         Marland Kitchen oxyCODONE-acetaminophen (PERCOCET/ROXICET) 5-325 MG per tablet   Oral   Take 1 tablet by mouth every 6 (six) hours as needed for severe pain.          . promethazine (PHENERGAN) 25 MG tablet   Oral   Take 25 mg by mouth every 6 (six) hours as needed for nausea or vomiting. For nausea         . traMADol (ULTRAM) 50 MG tablet   Oral   Take 1 tablet by mouth every 6 (six) hours as needed for moderate pain. pain         . naproxen (NAPROSYN) 500 MG tablet   Oral   Take 1 tablet (500 mg total) by mouth 2 (two) times daily with a meal.   30 tablet   0    BP 126/69  Pulse 118  Resp 16  SpO2 100%  LMP 06/04/2013 Physical Exam  Nursing note and vitals reviewed. Constitutional: She appears well-developed and well-nourished. No distress.  HENT:  Head: Normocephalic and atraumatic.  Mouth/Throat: Oropharynx is clear and moist. No oropharyngeal exudate.  Mucous membranes mildly dehydrated  Eyes: Conjunctivae and EOM are normal. Pupils are equal, round, and reactive to light. Right eye exhibits no discharge. Left eye exhibits no discharge. No scleral icterus.  Neck: Normal range of motion. Neck supple. No JVD present. No thyromegaly present.  Cardiovascular: Regular rhythm, normal heart sounds and intact distal pulses.  Exam reveals no gallop and no friction rub.   No murmur heard. Tachycardic to 120, normal pulses at the  radial arteries, normal capillary refill, no JVD  Pulmonary/Chest: Effort normal and breath sounds normal. No respiratory distress. She has no wheezes. She has no rales.  Abdominal: Soft. Bowel sounds are normal. She exhibits no distension and no mass. There is no tenderness.  Very soft nontender abdomen  Musculoskeletal: Normal range of motion. She exhibits no edema and no tenderness.  Lymphadenopathy:    She has no cervical adenopathy.  Neurological: She is alert. Coordination normal.  Skin: Skin is warm and dry. No rash noted. No erythema.  Psychiatric: She has a normal mood and affect. Her behavior is normal.    ED Course  Procedures (including critical care time) Labs Review Labs Reviewed  CBC - Abnormal; Notable for the following:    RBC 3.56 (*)    Hemoglobin 10.7 (*)    HCT 32.1 (*)    All other components within normal limits  BASIC METABOLIC PANEL - Abnormal; Notable for the following:    Potassium 3.3 (*)    Glucose, Bld 42 (*)    Creatinine, Ser 0.33 (*)    All other components within normal limits  CBG MONITORING, ED - Abnormal; Notable for the following:    Glucose-Capillary 45 (*)    All other components within normal limits  CBG MONITORING, ED - Abnormal; Notable for the following:    Glucose-Capillary 28 (*)    All other components within normal limits  CK  CBG MONITORING, ED  CBG MONITORING, ED   Imaging Review No results found.   EKG Interpretation   Date/Time:  Wednesday June 15 2013 23:28:21 EDT Ventricular Rate:  134 PR Interval:  125 QRS Duration: 72 QT Interval:  309 QTC Calculation: 461 R Axis:   66 Text Interpretation:  Sinus tachycardia Abnormal ekg Since last tracing QT  has shortened Confirmed by Erum Cercone  MD, [place] ([id number]) on 06/15/2013 11:33:36  PM      MDM   Final diagnoses:  Hypoglycemia  Myalgia  Chest pain    The patient's physical exam is significant only for what appears to be mild dehydration and tachycardia. This is  consistent with her clinical presentation and history. I doubt that her chest pain and tachycardia is related to pulmonary embolism given her recent cardiac and vascular workup over the last month. She otherwise appears benign, we'll check a CK to rule out rhabdomyolysis given her chronic myalgias however at this point her hypoglycemia was identified likely given her decreased oral intake, treated with oral and intravenous replacement, she appears stable at this time  Hypoglycemia resolved with oral food intake, no vomiting, Toradol with resolution of the patient's pain. No signs of cardiac injury, she does have tachycardia which has been persistent but improved. IV fluids, medications improved her symptoms, she states that she feels comfortable for discharge and will continue to eat at home. I have discussed with the patient at length the need to followup again with cardiology and her family doctor regarding the source and etiology of her tachycardia and she has expressed her understanding.   Meds given in ED:  Medications  dextrose 50 % solution 50 mL (50 mLs Intravenous Given 06/16/13 0107)  ketorolac (TORADOL) 30 MG/ML injection 30 mg (30 mg Intravenous Given 06/16/13 0310)    New Prescriptions   NAPROXEN (NAPROSYN) 500 MG TABLET    Take 1 tablet (500 mg total) by mouth 2 (two) times daily with a meal.        Johnna Acosta, MD 06/16/13 [phone]

## 2013-06-16 NOTE — ED Notes (Signed)
Pt continues to be A&Ox4, no acute distress, c/o dizziness - recheck CBG 28 - D50 administered

## 2013-06-16 NOTE — ED Notes (Signed)
Pt reports chest pain that began this evening and general body aches that began after doing housework today, pt recently d/c'd on Sunday from hospital, states she was admitted d/t DKA. On arrival to room, pt found to be hypoglycemic w/ CBG of 45, pt provided w/ orange juice, crackers, and peanut butter.

## 2013-06-16 NOTE — Discharge Instructions (Signed)
Your testing showed that you have low blood sugar tonight - this is likely because you haven't been eating enough.  Your blood sugar has stayed in a normal range after eating here - please continue to eat when you get home - stay on a diabetic diet and call your doctor in the morning for a recheck in 24 hours.  Your ECG showed NO signs of heart troubles and your blood work was overall unremarkable.  Please call your doctor for a followup appointment within 24-48 hours. When you talk to your doctor please let them know that you were seen in the emergency department and have them acquire all of your records so that they can discuss the findings with you and formulate a treatment plan to fully care for your new and ongoing problems.

## 2013-06-16 NOTE — ED Notes (Signed)
Notified RN, Koechert pt. CBG 45. Pt. Given orange juice.

## 2013-06-16 NOTE — ED Notes (Signed)
Pt was seen here last night for the same complaint, she complains of generalized body aches.

## 2013-06-17 LAB — CBG MONITORING, ED: Glucose-Capillary: 102 mg/dL — ABNORMAL HIGH (ref 70–99)

## 2013-06-17 MED ORDER — KETOROLAC TROMETHAMINE 60 MG/2ML IM SOLN
60.0000 mg | Freq: Once | INTRAMUSCULAR | Status: AC
  Administered 2013-06-17: 60 mg via INTRAMUSCULAR
  Filled 2013-06-17: qty 2

## 2013-06-17 NOTE — ED Notes (Signed)
MD made aware that pt is tachycardic.

## 2013-06-17 NOTE — ED Notes (Signed)
CBG 102 

## 2013-06-17 NOTE — Discharge Instructions (Signed)
Naprosyn for your leg pain

## 2013-06-17 NOTE — ED Provider Notes (Signed)
CSN: [id number]     Arrival date & time 06/16/13  2206 History   First MD Initiated Contact with Patient 06/17/13 0036     Chief Complaint  Patient presents with  . Generalized Body Aches     (Consider location/radiation/quality/duration/timing/severity/associated sxs/prior Treatment) HPI Comments: 23 year old female, history of myalgias presents with recurrent myalgias of her legs. I saw the same patient 24 hours ago, she improved completely with Toradol and had a benign workup. She states that her pain came back at 8:00 this evening and has been persistent, she has been able to ambulate without difficulty and denies any lightheadedness, chest pain, shortness of breath or any other complaints. She states that this is a frequent presentation for her. It is similar to prior presentations. She denies swelling, redness, or history of DVT.  The history is provided by the patient and medical records.    Past Medical History  Diagnosis Date  . Diabetes mellitus   . Hypertension   . Diabetes type 1, uncontrolled 11/14/2011    Since age 52  . Gastroparesis    Past Surgical History  Procedure Laterality Date  . Ankle surgery    . Cholecystectomy  11/15/2011    Procedure: LAPAROSCOPIC CHOLECYSTECTOMY WITH INTRAOPERATIVE CHOLANGIOGRAM;  Surgeon: [name], MD;  Location: [facility];  Service: General;  Laterality: N/A;  . Laparoscopy  11/23/2011    Procedure: LAPAROSCOPY DIAGNOSTIC;  Surgeon: Edward Jolly, MD;  Location: [facility];  Service: General;  Laterality: N/A;  . Esophagogastroduodenoscopy  12/03/2011    Procedure: ESOPHAGOGASTRODUODENOSCOPY (EGD);  Surgeon: Beryle Beams, MD;  Location: [facility];  Service: Endoscopy;  Laterality: N/A;   Family History  Problem Relation Age of Onset  . Diabetes Mother   . Hypertension Father    History  Substance Use Topics  . Smoking status: Never Smoker   . Smokeless tobacco: Never Used  . Alcohol Use: Yes     Comment: Occasional    OB History   Grav Para Term Preterm Abortions TAB SAB Ect Mult Living                 Review of Systems  All other systems reviewed and are negative.      Allergies  Peanut-containing drug products  Home Medications   Current Outpatient Rx  Name  Route  Sig  Dispense  Refill  . DULoxetine (CYMBALTA) 30 MG capsule   Oral   Take 30 mg by mouth at bedtime.         . gabapentin (NEURONTIN) 400 MG capsule   Oral   Take 400 mg by mouth 4 (four) times daily.         . hydroxypropyl methylcellulose (ISOPTO TEARS) 2.5 % ophthalmic solution   Both Eyes   Place 1 drop into both eyes 3 (three) times daily as needed for dry eyes.         . insulin glargine (LANTUS) 100 UNIT/ML injection   Subcutaneous   Inject 0.4 mLs (40 Units total) into the skin at bedtime.   10 mL   11   . insulin lispro (HUMALOG) 100 UNIT/ML injection   Subcutaneous   Inject 5-15 Units into the skin 3 (three) times daily with meals. Inject 5 units (base) and additional units according to sliding scale- sliding scale: CBG 70 - 120: 0 units: CBG 121 - 150: 2 units; CBG 151 - 200: 3 units; CBG 201 - 250: 5 units; CBG 251 - 300: 8 units; CBG  301 - 350: 11 units; CBG 351 - 400: 15 units; CBG > 400 Call MD   10 mL   11   . metoCLOPramide (REGLAN) 10 MG tablet   Oral   Take 10 mg by mouth 3 (three) times daily as needed for nausea. Takes with each meal only as needed for nausea.         Marland Kitchen oxyCODONE-acetaminophen (PERCOCET/ROXICET) 5-325 MG per tablet   Oral   Take 1 tablet by mouth every 6 (six) hours as needed for severe pain.          . promethazine (PHENERGAN) 25 MG tablet   Oral   Take 25 mg by mouth every 6 (six) hours as needed for nausea or vomiting. For nausea         . traMADol (ULTRAM) 50 MG tablet   Oral   Take 1 tablet by mouth every 6 (six) hours as needed for moderate pain. pain         . aspirin-acetaminophen-caffeine (EXCEDRIN MIGRAINE) 250-250-65 MG per tablet   Oral    Take 2 tablets by mouth every 6 (six) hours as needed for headache.         . cyclobenzaprine (FLEXERIL) 10 MG tablet   Oral   Take 10 mg by mouth 3 (three) times daily as needed for muscle spasms.         . naproxen (NAPROSYN) 250 MG tablet   Oral   Take 250-500 mg by mouth 2 (two) times daily as needed for moderate pain.         . naproxen (NAPROSYN) 500 MG tablet   Oral   Take 1 tablet (500 mg total) by mouth 2 (two) times daily with a meal.   30 tablet   0    BP 124/80  Pulse 132  Temp(Src) 98.6 F (37 C) (Oral)  Resp 16  SpO2 100%  LMP 06/04/2013 Physical Exam  Nursing note and vitals reviewed. Constitutional: She appears well-developed and well-nourished. No distress.  HENT:  Head: Normocephalic and atraumatic.  Mouth/Throat: Oropharynx is clear and moist. No oropharyngeal exudate.  Eyes: Conjunctivae and EOM are normal. Pupils are equal, round, and reactive to light. Right eye exhibits no discharge. Left eye exhibits no discharge. No scleral icterus.  Neck: Normal range of motion. Neck supple. No JVD present. No thyromegaly present.  Cardiovascular: Normal rate, regular rhythm, normal heart sounds and intact distal pulses.  Exam reveals no gallop and no friction rub.   No murmur heard. Pulmonary/Chest: Effort normal and breath sounds normal. No respiratory distress. She has no wheezes. She has no rales.  Abdominal: Soft. Bowel sounds are normal. She exhibits no distension and no mass. There is no tenderness.  Musculoskeletal: Normal range of motion. She exhibits no edema and no tenderness.  No swelling or tenderness of the lower extremities, normal joints, there is supple, compartment or soft, she is able to straight leg raising is resistance with normal strength.  Lymphadenopathy:    She has no cervical adenopathy.  Neurological: She is alert. Coordination normal.  Skin: Skin is warm and dry. No rash noted. No erythema.  Psychiatric: She has a normal mood and  affect. Her behavior is normal.    ED Course  Procedures (including critical care time) Labs Review Labs Reviewed  CBG MONITORING, ED   Imaging Review No results found.   EKG Interpretation None      MDM   Final diagnoses:  Myalgia    The patient  is a benign exam, again she is tachycardic as she always does when she is here. I have asked her to drink more fluids and followup with her doctor for this. She has been seen by cardiology in the past, she is followed for her tachycardia, there is no specific diagnosis for her tachycardia.    Meds given in ED:  Medications  ketorolac (TORADOL) injection 60 mg (not administered)    New Prescriptions   No medications on file      Johnna Acosta, MD 06/17/13 [phone]

## 2013-06-22 ENCOUNTER — Ambulatory Visit: Payer: 59 | Admitting: Diagnostic Neuroimaging

## 2013-06-30 ENCOUNTER — Ambulatory Visit: Payer: 59 | Admitting: Diagnostic Neuroimaging

## 2013-07-02 ENCOUNTER — Encounter (HOSPITAL_COMMUNITY): Payer: Self-pay | Admitting: Emergency Medicine

## 2013-07-02 ENCOUNTER — Emergency Department (HOSPITAL_COMMUNITY)
Admission: EM | Admit: 2013-07-02 | Discharge: 2013-07-02 | Disposition: A | Discharge status: Home / Self Care | Payer: 59 | Attending: Emergency Medicine | Admitting: Emergency Medicine

## 2013-07-02 DIAGNOSIS — Z3202 Encounter for pregnancy test, result negative: Secondary | ICD-10-CM | POA: Insufficient documentation

## 2013-07-02 DIAGNOSIS — Z791 Long term (current) use of non-steroidal anti-inflammatories (NSAID): Secondary | ICD-10-CM | POA: Insufficient documentation

## 2013-07-02 DIAGNOSIS — G8929 Other chronic pain: Secondary | ICD-10-CM | POA: Insufficient documentation

## 2013-07-02 DIAGNOSIS — E1065 Type 1 diabetes mellitus with hyperglycemia: Secondary | ICD-10-CM | POA: Insufficient documentation

## 2013-07-02 DIAGNOSIS — I10 Essential (primary) hypertension: Secondary | ICD-10-CM | POA: Insufficient documentation

## 2013-07-02 DIAGNOSIS — IMO0001 Reserved for inherently not codable concepts without codable children: Secondary | ICD-10-CM | POA: Insufficient documentation

## 2013-07-02 DIAGNOSIS — Z794 Long term (current) use of insulin: Secondary | ICD-10-CM | POA: Insufficient documentation

## 2013-07-02 DIAGNOSIS — M791 Myalgia, unspecified site: Secondary | ICD-10-CM

## 2013-07-02 DIAGNOSIS — Z79899 Other long term (current) drug therapy: Secondary | ICD-10-CM | POA: Insufficient documentation

## 2013-07-02 DIAGNOSIS — K3184 Gastroparesis: Secondary | ICD-10-CM | POA: Insufficient documentation

## 2013-07-02 DIAGNOSIS — R000 Tachycardia, unspecified: Secondary | ICD-10-CM | POA: Insufficient documentation

## 2013-07-02 DIAGNOSIS — IMO0002 Reserved for concepts with insufficient information to code with codable children: Secondary | ICD-10-CM | POA: Insufficient documentation

## 2013-07-02 LAB — COMPREHENSIVE METABOLIC PANEL
ALT: 27 U/L (ref 0–35)
AST: 34 U/L (ref 0–37)
Albumin: 3.6 g/dL (ref 3.5–5.2)
Alkaline Phosphatase: 115 U/L (ref 39–117)
BUN: 11 mg/dL (ref 6–23)
CO2: 26 mEq/L (ref 19–32)
Calcium: 9.9 mg/dL (ref 8.4–10.5)
Chloride: 100 mEq/L (ref 96–112)
Creatinine, Ser: 0.37 mg/dL — ABNORMAL LOW (ref 0.50–1.10)
GFR calc Af Amer: >90 mL/min (ref >90)
GFR calc non Af Amer: >90 mL/min (ref >90)
Glucose, Bld: 157 mg/dL — ABNORMAL HIGH (ref 70–99)
Potassium: 3.9 mEq/L (ref 3.7–5.3)
Sodium: 138 mEq/L (ref 137–147)
Total Bilirubin: <0.2 mg/dL — ABNORMAL LOW (ref 0.3–1.2)
Total Protein: 7 g/dL (ref 6.0–8.3)

## 2013-07-02 LAB — CBC WITH DIFFERENTIAL/PLATELET
Basophils Absolute: 0 10*3/uL (ref 0.0–0.1)
Basophils Relative: 0 % (ref 0–1)
Eosinophils Absolute: 0.1 10*3/uL (ref 0.0–0.7)
Eosinophils Relative: 2 % (ref 0–5)
HCT: 35.2 % — ABNORMAL LOW (ref 36.0–46.0)
Hemoglobin: 12.2 g/dL (ref 12.0–15.0)
Lymphocytes Relative: 61 % — ABNORMAL HIGH (ref 12–46)
Lymphs Abs: 3.3 10*3/uL (ref 0.7–4.0)
MCH: 32.1 pg (ref 26.0–34.0)
MCHC: 34.7 g/dL (ref 30.0–36.0)
MCV: 92.6 fL (ref 78.0–100.0)
Monocytes Absolute: 0.3 10*3/uL (ref 0.1–1.0)
Monocytes Relative: 5 % (ref 3–12)
Neutro Abs: 1.7 10*3/uL (ref 1.7–7.7)
Neutrophils Relative %: 31 % — ABNORMAL LOW (ref 43–77)
Platelets: 237 10*3/uL (ref 150–400)
RBC: 3.8 MIL/uL — ABNORMAL LOW (ref 3.87–5.11)
RDW: 12.3 % (ref 11.5–15.5)
WBC: 5.4 10*3/uL (ref 4.0–10.5)

## 2013-07-02 LAB — CBG MONITORING, ED: Glucose-Capillary: 242 mg/dL — ABNORMAL HIGH (ref 70–99)

## 2013-07-02 LAB — PREGNANCY, URINE: Preg Test, Ur: NEGATIVE

## 2013-07-02 MED ORDER — OXYCODONE-ACETAMINOPHEN 5-325 MG PO TABS: 2.0000 | ORAL_TABLET | ORAL | Status: DC | PRN

## 2013-07-02 MED ORDER — MORPHINE SULFATE 4 MG/ML IJ SOLN
4.0000 mg | Freq: Once | INTRAMUSCULAR | Status: AC
  Administered 2013-07-02: 4 mg via INTRAVENOUS
  Filled 2013-07-02: qty 1

## 2013-07-02 MED ORDER — MORPHINE SULFATE 4 MG/ML IJ SOLN
4.0000 mg | Freq: Once | INTRAMUSCULAR | Status: AC
  Administered 2013-07-02: 4 mg via INTRAVENOUS

## 2013-07-02 MED ORDER — SODIUM CHLORIDE 0.9 % IV BOLUS (SEPSIS)
1000.0000 mL | Freq: Once | INTRAVENOUS | Status: AC
  Administered 2013-07-02: 1000 mL via INTRAVENOUS

## 2013-07-02 MED ORDER — MORPHINE SULFATE 4 MG/ML IJ SOLN
INTRAMUSCULAR | Status: AC
  Filled 2013-07-02: qty 1

## 2013-07-02 NOTE — ED Provider Notes (Signed)
CSN: [id number]     Arrival date & time 07/02/13  0032 History   First MD Initiated Contact with Patient 07/02/13 0047     Chief Complaint  Patient presents with  . Generalized Body Aches     (Consider location/radiation/quality/duration/timing/severity/associated sxs/prior Treatment) HPI Patient has a history of diabetes, hypertension and chronic pain. She has diffuse muscle pain daily that she describes as throbbing. She takes Naprosyn and Percocet at home. She took her last Percocet roughly 24 hours ago. Her pain has worsened over the last 24 hours. This is the same type of pain as her chronic symptoms. She has no nausea, vomiting or diarrhea. Denies any shortness of breath or coughing. She denies any fevers or chills. Patient states that her heart rate does run high but elevates when she is in pain. She seen a neurologist and rheumatologist for her chronic muscular pain with no diagnosis. She is following up with her endocrinologist for further studies. Past Medical History  Diagnosis Date  . Diabetes mellitus   . Hypertension   . Diabetes type 1, uncontrolled 11/14/2011    Since age 73  . Gastroparesis    Past Surgical History  Procedure Laterality Date  . Ankle surgery    . Cholecystectomy  11/15/2011    Procedure: LAPAROSCOPIC CHOLECYSTECTOMY WITH INTRAOPERATIVE CHOLANGIOGRAM;  Surgeon: [name], MD;  Location: [facility];  Service: General;  Laterality: N/A;  . Laparoscopy  11/23/2011    Procedure: LAPAROSCOPY DIAGNOSTIC;  Surgeon: Mariella Saa, MD;  Location: [facility];  Service: General;  Laterality: N/A;  . Esophagogastroduodenoscopy  12/03/2011    Procedure: ESOPHAGOGASTRODUODENOSCOPY (EGD);  Surgeon: Theda Belfast, MD;  Location: [facility];  Service: Endoscopy;  Laterality: N/A;   Family History  Problem Relation Age of Onset  . Diabetes Mother   . Hypertension Father    History  Substance Use Topics  . Smoking status: Never Smoker   . Smokeless tobacco: Never  Used  . Alcohol Use: Yes     Comment: Occasional   OB History   Grav Para Term Preterm Abortions TAB SAB Ect Mult Living                 Review of Systems  Constitutional: Negative for fever, chills and fatigue.  Respiratory: Negative for shortness of breath.   Cardiovascular: Negative for chest pain.  Gastrointestinal: Negative for nausea, vomiting, abdominal pain and diarrhea.  Genitourinary: Negative for dysuria and frequency.  Musculoskeletal: Positive for myalgias. Negative for back pain, neck pain and neck stiffness.  Skin: Negative for rash.  Neurological: Negative for dizziness, weakness, numbness and headaches.  All other systems reviewed and are negative.      Allergies  Peanut-containing drug products  Home Medications   Current Outpatient Rx  Name  Route  Sig  Dispense  Refill  . cyclobenzaprine (FLEXERIL) 10 MG tablet   Oral   Take 10 mg by mouth 3 (three) times daily as needed for muscle spasms.         . DULoxetine (CYMBALTA) 30 MG capsule   Oral   Take 30 mg by mouth at bedtime.         . gabapentin (NEURONTIN) 400 MG capsule   Oral   Take 400 mg by mouth 4 (four) times daily.         . insulin glargine (LANTUS) 100 UNIT/ML injection   Subcutaneous   Inject 0.4 mLs (40 Units total) into the skin at bedtime.   10  mL   11   . insulin lispro (HUMALOG) 100 UNIT/ML injection   Subcutaneous   Inject 5-15 Units into the skin 3 (three) times daily with meals. Inject 5 units (base) and additional units according to sliding scale- sliding scale: CBG 70 - 120: 0 units: CBG 121 - 150: 2 units; CBG 151 - 200: 3 units; CBG 201 - 250: 5 units; CBG 251 - 300: 8 units; CBG 301 - 350: 11 units; CBG 351 - 400: 15 units; CBG > 400 Call MD   10 mL   11   . magnesium oxide (MAG-OX) 400 MG tablet   Oral   Take 400 mg by mouth at bedtime.         . metoCLOPramide (REGLAN) 10 MG tablet   Oral   Take 10 mg by mouth 3 (three) times daily as needed for nausea.  Takes with each meal only as needed for nausea.         . naproxen (NAPROSYN) 500 MG tablet   Oral   Take 1 tablet (500 mg total) by mouth 2 (two) times daily with a meal.   30 tablet   0   . oxyCODONE-acetaminophen (PERCOCET/ROXICET) 5-325 MG per tablet   Oral   Take 1 tablet by mouth every 6 (six) hours as needed for severe pain.          . promethazine (PHENERGAN) 25 MG tablet   Oral   Take 25 mg by mouth every 6 (six) hours as needed for nausea or vomiting. For nausea         . oxyCODONE-acetaminophen (PERCOCET) 5-325 MG per tablet   Oral   Take 2 tablets by mouth every 4 (four) hours as needed.   10 tablet   0    BP 108/60  Pulse 125  Temp(Src) 98.6 F (37 C) (Oral)  Resp 15  Ht 5\' 7"  (1.702 m)  Wt 120 lb (54.432 kg)  BMI 18.79 kg/m2  SpO2 100%  LMP 06/11/2013 Physical Exam  Nursing note and vitals reviewed. Constitutional: She is oriented to person, place, and time. She appears well-developed and well-nourished. No distress.  HENT:  Head: Normocephalic and atraumatic.  Mouth/Throat: Oropharynx is clear and moist.  Eyes: EOM are normal. Pupils are equal, round, and reactive to light.  Neck: Normal range of motion. Neck supple.  Cardiovascular: Regular rhythm.   Tachycardia  Pulmonary/Chest: Effort normal and breath sounds normal. No respiratory distress. She has no wheezes. She has no rales. She exhibits no tenderness.  Abdominal: Soft. Bowel sounds are normal. She exhibits no distension. There is no tenderness. There is no rebound and no guarding.  Musculoskeletal: Normal range of motion. She exhibits no edema and no tenderness.  Diffuse muscular tenderness. Patient has no calf swelling   Neurological: She is alert and oriented to person, place, and time.  Patient is alert and oriented x3 with clear, goal oriented speech. Patient has 5/5 motor in all extremities. Sensation is intact to light touch.    Skin: Skin is warm and dry. No rash noted. No  erythema.  Psychiatric: She has a normal mood and affect. Her behavior is normal.    ED Course  Procedures (including critical care time) Labs Review Labs Reviewed  CBC WITH DIFFERENTIAL - Abnormal; Notable for the following:    RBC 3.80 (*)    HCT 35.2 (*)    Neutrophils Relative % 31 (*)    Lymphocytes Relative 61 (*)    All other  components within normal limits  COMPREHENSIVE METABOLIC PANEL - Abnormal; Notable for the following:    Glucose, Bld 157 (*)    Creatinine, Ser 0.37 (*)    Total Bilirubin <0.2 (*)    All other components within normal limits  CBG MONITORING, ED - Abnormal; Notable for the following:    Glucose-Capillary 242 (*)    All other components within normal limits  PREGNANCY, URINE   Imaging Review No results found.   EKG Interpretation None      MDM   Final diagnoses:  Myalgia  Chronic pain  Tachycardia    Patient's heart rate has come down significantly. She states she runs an elevated heart rate. She is feeling much more comfortable. We'll discharge home to followup with her primary Dr. Return precautions have been given the patient voiced understanding.    [name], MD 07/02/13 [phone]

## 2013-07-02 NOTE — Discharge Instructions (Signed)
Myalgia, Adult °Myalgia is the medical term for muscle pain. It is a symptom of many things. Nearly everyone at some time in their life has this. The most common cause for muscle pain is overuse or straining and more so when you are not in shape. Injuries and muscle bruises cause myalgias. Muscle pain without a history of injury can also be caused by a virus. It frequently comes along with the flu. Myalgia not caused by muscle strain can be present in a large number of infectious diseases. Some autoimmune diseases like lupus and fibromyalgia can cause muscle pain. Myalgia may be mild, or severe. °SYMPTOMS  °The symptoms of myalgia are simply muscle pain. Most of the time this is short lived and the pain goes away without treatment. °DIAGNOSIS  °Myalgia is diagnosed by your caregiver by taking your history. This means you tell him when the problems began, what they are, and what has been happening. If this has not been a long term problem, your caregiver may want to watch for a while to see what will happen. If it has been long term, they may want to do additional testing. °TREATMENT  °The treatment depends on what the underlying cause of the muscle pain is. Often anti-inflammatory medications will help. °HOME CARE INSTRUCTIONS °· If the pain in your muscles came from overuse, slow down your activities until the problems go away. °· Myalgia from overuse of a muscle can be treated with alternating hot and cold packs on the muscle affected or with cold for the first couple days. If either heat or cold seems to make things worse, stop their use. °· Apply ice to the sore area for 15-20 minutes, 03-04 times per day, while awake for the first 2 days of muscle soreness, or as directed. Put the ice in a plastic bag and place a towel between the bag of ice and your skin. °· Only take over-the-counter or prescription medicines for pain, discomfort, or fever as directed by your caregiver. °· Regular gentle exercise may help if  you are not active. °· Stretching before strenuous exercise can help lower the risk of myalgia. It is normal when beginning an exercise regimen to feel some muscle pain after exercising. Muscles that have not been used frequently will be sore at first. If the pain is extreme, this may mean injury to a muscle. °SEEK MEDICAL CARE IF: °· You have an increase in muscle pain that is not relieved with medication. °· You begin to run a temperature. °· You develop nausea and vomiting. °· You develop a stiff and painful neck. °· You develop a rash. °· You develop muscle pain after a tick bite. °· You have continued muscle pain while working out even after you are in good condition. °SEEK IMMEDIATE MEDICAL CARE IF: °Any of your problems are getting worse and medications are not helping. °MAKE SURE YOU:  °· Understand these instructions. °· Will watch your condition. °· Will get help right away if you are not doing well or get worse. °Document Released: 02/06/2006 Document Revised: 06/09/2011 Document Reviewed: 04/28/2006 °[employer]® Patient Information ©2014 [employer]. ° °

## 2013-07-02 NOTE — ED Notes (Signed)
Pt c/o generalized body pain onset yesterday, worsening tonight.

## 2013-07-06 ENCOUNTER — Emergency Department (HOSPITAL_COMMUNITY)
Admission: EM | Admit: 2013-07-06 | Discharge: 2013-07-07 | Disposition: A | Discharge status: Home / Self Care | Payer: 59 | Attending: Emergency Medicine | Admitting: Emergency Medicine

## 2013-07-06 ENCOUNTER — Encounter (HOSPITAL_COMMUNITY): Payer: Self-pay | Admitting: Emergency Medicine

## 2013-07-06 DIAGNOSIS — Z79899 Other long term (current) drug therapy: Secondary | ICD-10-CM | POA: Insufficient documentation

## 2013-07-06 DIAGNOSIS — E1069 Type 1 diabetes mellitus with other specified complication: Secondary | ICD-10-CM

## 2013-07-06 DIAGNOSIS — E1065 Type 1 diabetes mellitus with hyperglycemia: Secondary | ICD-10-CM | POA: Insufficient documentation

## 2013-07-06 DIAGNOSIS — G8929 Other chronic pain: Secondary | ICD-10-CM | POA: Insufficient documentation

## 2013-07-06 DIAGNOSIS — Z794 Long term (current) use of insulin: Secondary | ICD-10-CM | POA: Insufficient documentation

## 2013-07-06 DIAGNOSIS — E119 Type 2 diabetes mellitus without complications: Secondary | ICD-10-CM

## 2013-07-06 DIAGNOSIS — K3184 Gastroparesis: Secondary | ICD-10-CM | POA: Insufficient documentation

## 2013-07-06 DIAGNOSIS — IMO0002 Reserved for concepts with insufficient information to code with codable children: Secondary | ICD-10-CM | POA: Insufficient documentation

## 2013-07-06 DIAGNOSIS — R0789 Other chest pain: Secondary | ICD-10-CM | POA: Insufficient documentation

## 2013-07-06 DIAGNOSIS — R52 Pain, unspecified: Secondary | ICD-10-CM | POA: Insufficient documentation

## 2013-07-06 DIAGNOSIS — R000 Tachycardia, unspecified: Secondary | ICD-10-CM | POA: Insufficient documentation

## 2013-07-06 DIAGNOSIS — E162 Hypoglycemia, unspecified: Secondary | ICD-10-CM

## 2013-07-06 LAB — BASIC METABOLIC PANEL
BUN: 13 mg/dL (ref 6–23)
CO2: 27 mEq/L (ref 19–32)
Calcium: 10.3 mg/dL (ref 8.4–10.5)
Chloride: 101 mEq/L (ref 96–112)
Creatinine, Ser: 0.39 mg/dL — ABNORMAL LOW (ref 0.50–1.10)
GFR calc Af Amer: >90 mL/min (ref >90)
GFR calc non Af Amer: >90 mL/min (ref >90)
Glucose, Bld: 69 mg/dL — ABNORMAL LOW (ref 70–99)
Potassium: 3.7 mEq/L (ref 3.7–5.3)
Sodium: 141 mEq/L (ref 137–147)

## 2013-07-06 LAB — CBC
HCT: 38.1 % (ref 36.0–46.0)
Hemoglobin: 12.7 g/dL (ref 12.0–15.0)
MCH: 30.6 pg (ref 26.0–34.0)
MCHC: 33.3 g/dL (ref 30.0–36.0)
MCV: 91.8 fL (ref 78.0–100.0)
Platelets: 256 10*3/uL (ref 150–400)
RBC: 4.15 MIL/uL (ref 3.87–5.11)
RDW: 11.9 % (ref 11.5–15.5)
WBC: 5 10*3/uL (ref 4.0–10.5)

## 2013-07-06 LAB — TROPONIN I: Troponin I: <0.3 ng/mL (ref <0.30)

## 2013-07-06 NOTE — ED Notes (Signed)
Pt reports bad body and chest pain. Body pain started at 430pm with chest pain starting a few hours ago. Pt denies n/v, SOB, dizziness, or lightheadedness.  Pt states she took a Percocet this morning with no relief. Chest pain is centralized with tightness. Pt alert and ambulatory.

## 2013-07-07 ENCOUNTER — Encounter (HOSPITAL_COMMUNITY): Payer: Self-pay | Admitting: Emergency Medicine

## 2013-07-07 ENCOUNTER — Emergency Department (HOSPITAL_COMMUNITY)
Admission: EM | Admit: 2013-07-07 | Discharge: 2013-07-08 | Disposition: A | Discharge status: Home / Self Care | Payer: 59 | Attending: Emergency Medicine | Admitting: Emergency Medicine

## 2013-07-07 DIAGNOSIS — R0789 Other chest pain: Secondary | ICD-10-CM | POA: Insufficient documentation

## 2013-07-07 DIAGNOSIS — R945 Abnormal results of liver function studies: Secondary | ICD-10-CM

## 2013-07-07 DIAGNOSIS — Z79899 Other long term (current) drug therapy: Secondary | ICD-10-CM | POA: Insufficient documentation

## 2013-07-07 DIAGNOSIS — M549 Dorsalgia, unspecified: Secondary | ICD-10-CM | POA: Insufficient documentation

## 2013-07-07 DIAGNOSIS — I10 Essential (primary) hypertension: Secondary | ICD-10-CM | POA: Insufficient documentation

## 2013-07-07 DIAGNOSIS — Z8719 Personal history of other diseases of the digestive system: Secondary | ICD-10-CM | POA: Insufficient documentation

## 2013-07-07 DIAGNOSIS — Z794 Long term (current) use of insulin: Secondary | ICD-10-CM | POA: Insufficient documentation

## 2013-07-07 DIAGNOSIS — R000 Tachycardia, unspecified: Secondary | ICD-10-CM | POA: Insufficient documentation

## 2013-07-07 DIAGNOSIS — Z3202 Encounter for pregnancy test, result negative: Secondary | ICD-10-CM | POA: Insufficient documentation

## 2013-07-07 DIAGNOSIS — IMO0001 Reserved for inherently not codable concepts without codable children: Secondary | ICD-10-CM | POA: Insufficient documentation

## 2013-07-07 DIAGNOSIS — E119 Type 2 diabetes mellitus without complications: Secondary | ICD-10-CM | POA: Insufficient documentation

## 2013-07-07 DIAGNOSIS — M791 Myalgia, unspecified site: Secondary | ICD-10-CM

## 2013-07-07 DIAGNOSIS — R7989 Other specified abnormal findings of blood chemistry: Secondary | ICD-10-CM

## 2013-07-07 DIAGNOSIS — R748 Abnormal levels of other serum enzymes: Secondary | ICD-10-CM | POA: Insufficient documentation

## 2013-07-07 LAB — CBG MONITORING, ED
Glucose-Capillary: 155 mg/dL — ABNORMAL HIGH (ref 70–99)
Glucose-Capillary: 39 mg/dL — CL (ref 70–99)
Glucose-Capillary: 86 mg/dL (ref 70–99)

## 2013-07-07 MED ORDER — MORPHINE SULFATE 4 MG/ML IJ SOLN
4.0000 mg | Freq: Once | INTRAMUSCULAR | Status: AC
  Administered 2013-07-07: 4 mg via INTRAVENOUS
  Filled 2013-07-07: qty 1

## 2013-07-07 MED ORDER — KETOROLAC TROMETHAMINE 30 MG/ML IJ SOLN
30.0000 mg | Freq: Once | INTRAMUSCULAR | Status: AC
  Administered 2013-07-07: 30 mg via INTRAVENOUS
  Filled 2013-07-07: qty 1

## 2013-07-07 MED ORDER — DEXTROSE 50 % IV SOLN
50.0000 mL | Freq: Once | INTRAVENOUS | Status: AC
  Administered 2013-07-07: 50 mL via INTRAVENOUS
  Filled 2013-07-07: qty 50

## 2013-07-07 NOTE — ED Provider Notes (Signed)
Medical screening examination/treatment/procedure(s) were performed by non-physician practitioner and as supervising physician I was immediately available for consultation/collaboration.   EKG Interpretation   Date/Time:  Wednesday July 06 2013 22:27:23 EDT Ventricular Rate:  127 PR Interval:  128 QRS Duration: 73 QT Interval:  301 QTC Calculation: 437 R Axis:   75 Text Interpretation:  Sinus tachycardia No significant change since last  tracing Confirmed by Jadene Pierini, KRISTEN [phone]) on 07/06/2013 10:56:22 PM       Olivia Mackie, MD 07/07/13 [phone]

## 2013-07-07 NOTE — ED Notes (Signed)
Pt states she has chest pain that started yesterday and body aches   Pt states he was seen here yesterday for same and was told if it got worse to return  Pt states it is worse today

## 2013-07-07 NOTE — Discharge Instructions (Signed)
Diabetes Meal Planning Guide The diabetes meal planning guide is a tool to help you plan your meals and snacks. It is important for people with diabetes to manage their blood glucose (sugar) levels. Choosing the right foods and the right amounts throughout your day will help control your blood glucose. Eating right can even help you improve your blood pressure and reach or maintain a healthy weight. CARBOHYDRATE COUNTING MADE EASY When you eat carbohydrates, they turn to sugar. This raises your blood glucose level. Counting carbohydrates can help you control this level so you feel better. When you plan your meals by counting carbohydrates, you can have more flexibility in what you eat and balance your medicine with your food intake. Carbohydrate counting simply means adding up the total amount of carbohydrate grams in your meals and snacks. Try to eat about the same amount at each meal. Foods with carbohydrates are listed below. Each portion below is 1 carbohydrate serving or 15 grams of carbohydrates. Ask your dietician how many grams of carbohydrates you should eat at each meal or snack. Grains and Starches  1 slice bread.   English muffin or hotdog/hamburger bun.   cup cold cereal (unsweetened).   cup cooked pasta or rice.   cup starchy vegetables (corn, potatoes, peas, beans, winter squash).  1 tortilla (6 inches).   bagel.  1 waffle or pancake (size of a CD).   cup cooked cereal.  4 to 6 small crackers. *Whole grain is recommended. Fruit  1 cup fresh unsweetened berries, melon, papaya, pineapple.  1 small fresh fruit.   banana or mango.   cup fruit juice (4 oz unsweetened).   cup canned fruit in natural juice or water.  2 tbs dried fruit.  12 to 15 grapes or cherries. Milk and Yogurt  1 cup fat-free or 1% milk.  1 cup soy milk.  6 oz light yogurt with sugar-free sweetener.  6 oz low-fat soy yogurt.  6 oz plain yogurt. Vegetables  1 cup raw or  cup  cooked is counted as 0 carbohydrates or a "free" food.  If you eat 3 or more servings at 1 meal, count them as 1 carbohydrate serving. Other Carbohydrates   oz chips or pretzels.   cup ice cream or frozen yogurt.   cup sherbet or sorbet.  2 inch square cake, no frosting.  1 tbs honey, sugar, jam, jelly, or syrup.  2 small cookies.  3 squares of graham crackers.  3 cups popcorn.  6 crackers.  1 cup broth-based soup.  Count 1 cup casserole or other mixed foods as 2 carbohydrate servings.  Foods with less than 20 calories in a serving may be counted as 0 carbohydrates or a "free" food. You may want to purchase a book or computer software that lists the carbohydrate gram counts of different foods. In addition, the nutrition facts panel on the labels of the foods you eat are a good source of this information. The label will tell you how big the serving size is and the total number of carbohydrate grams you will be eating per serving. Divide this number by 15 to obtain the number of carbohydrate servings in a portion. Remember, 1 carbohydrate serving equals 15 grams of carbohydrate. SERVING SIZES Measuring foods and serving sizes helps you make sure you are getting the right amount of food. The list below tells how big or small some common serving sizes are.  1 oz.........4 stacked dice.  3 oz........Marland KitchenDeck of cards.  1 tsp.......Marland KitchenTip  of little finger.  1 tbs......Marland KitchenMarland KitchenThumb.  2 tbs.......Marland KitchenGolf ball.   cup......Marland KitchenHalf of a fist.  1 cup.......Marland KitchenA fist. SAMPLE DIABETES MEAL PLAN Below is a sample meal plan that includes foods from the grain and starches, dairy, vegetable, fruit, and meat groups. A dietician can individualize a meal plan to fit your calorie needs and tell you the number of servings needed from each food group. However, controlling the total amount of carbohydrates in your meal or snack is more important than making sure you include all of the food groups at every  meal. You may interchange carbohydrate containing foods (dairy, starches, and fruits). The meal plan below is an example of a 2000 calorie diet using carbohydrate counting. This meal plan has 17 carbohydrate servings. Breakfast  1 cup oatmeal (2 carb servings).   cup light yogurt (1 carb serving).  1 cup blueberries (1 carb serving).   cup almonds. Snack  1 large apple (2 carb servings).  1 low-fat string cheese stick. Lunch  Chicken breast salad.  1 cup spinach.   cup chopped tomatoes.  2 oz chicken breast, sliced.  2 tbs low-fat [place] dressing.  12 whole-wheat crackers (2 carb servings).  12 to 15 grapes (1 carb serving).  1 cup low-fat milk (1 carb serving). Snack  1 cup carrots.   cup hummus (1 carb serving). Dinner  3 oz broiled salmon.  1 cup brown rice (3 carb servings). Snack  1  cups steamed broccoli (1 carb serving) drizzled with 1 tsp olive oil and lemon juice.  1 cup light pudding (2 carb servings). DIABETES MEAL PLANNING WORKSHEET Your dietician can use this worksheet to help you decide how many servings of foods and what types of foods are right for you.  BREAKFAST Food Group and Servings / Carb Servings Grain/Starches __________________________________ Dairy __________________________________________ Vegetable ______________________________________ Fruit ___________________________________________ Meat __________________________________________ Fat ____________________________________________ LUNCH Food Group and Servings / Carb Servings Grain/Starches ___________________________________ Dairy ___________________________________________ Fruit ____________________________________________ Meat ___________________________________________ Fat _____________________________________________ Laural [place] Food Group and Servings / Carb Servings Grain/Starches ___________________________________ Dairy  ___________________________________________ Fruit ____________________________________________ Meat ___________________________________________ Fat _____________________________________________ SNACKS Food Group and Servings / Carb Servings Grain/Starches ___________________________________ Dairy ___________________________________________ Vegetable _______________________________________ Fruit ____________________________________________ Meat ___________________________________________ Fat _____________________________________________ DAILY TOTALS Starches _________________________ Vegetable ________________________ Fruit ____________________________ Dairy ____________________________ Meat ____________________________ Fat ______________________________ Document Released: 12/12/2004 Document Revised: 06/09/2011 Document Reviewed: 10/23/2008 [employer] Patient Information 2014 [employer].    Hypoglycemia (Low Blood Sugar) Hypoglycemia is when the glucose (sugar) in your blood is too low. Hypoglycemia can happen for many reasons. It can happen to people with or without diabetes. Hypoglycemia can develop quickly and can be a medical emergency.  CAUSES  Having hypoglycemia does not mean that you will develop diabetes. Different causes include:  Missed or delayed meals or not enough carbohydrates eaten.  Medication overdose. This could be by accident or deliberate. If by accident, your medication may need to be adjusted or changed.  Exercise or increased activity without adjustments in carbohydrates or medications.  A nerve disorder that affects body functions like your heart rate, blood pressure and digestion (autonomic neuropathy).  A condition where the stomach muscles do not function properly (gastroparesis). Therefore, medications may not absorb properly.  The inability to recognize the signs of hypoglycemia (hypoglycemic unawareness).  Absorption of insulin  may be  altered.  Alcohol consumption.  Pregnancy/menstrual cycles/postpartum. This may be due to hormones.  Certain kinds of tumors. This is very rare. SYMPTOMS   Sweating.  Hunger.  Dizziness.  Blurred vision.  Drowsiness.  Weakness.  Headache.  Rapid heart beat.  Shakiness.  Nervousness. DIAGNOSIS  Diagnosis is made by monitoring blood glucose in one or all of the following ways:  Fingerstick blood glucose monitoring.  Laboratory results. TREATMENT  If you think your blood glucose is low:  Check your blood glucose, if possible. If it is less than 70 mg/dl, take one of the following:  3-4 glucose tablets.   cup juice (prefer clear like apple).   cup "regular" soda pop.  1 cup milk.  -1 tube of glucose gel.  5-6 hard candies.  Do not over treat because your blood glucose (sugar) will only go too high.  Wait 15 minutes and recheck your blood glucose. If it is still less than 70 mg/dl (or below your target range), repeat treatment.  Eat a snack if it is more than one hour until your next meal. Sometimes, your blood glucose may go so low that you are unable to treat yourself. You may need someone to help you. You may even pass out or be unable to swallow. This may require you to get an injection of glucagon, which raises the blood glucose. HOME CARE INSTRUCTIONS  Check blood glucose as recommended by your caregiver.  Take medication as prescribed by your caregiver.  Follow your meal plan. Do not skip meals. Eat on time.  If you are going to drink alcohol, drink it only with meals.  Check your blood glucose before driving.  Check your blood glucose before and after exercise. If you exercise longer or different than usual, be sure to check blood glucose more frequently.  Always carry treatment with you. Glucose tablets are the easiest to carry.  Always wear medical alert jewelry or carry some form of identification that states that you have diabetes.  This will alert people that you have diabetes. If you have hypoglycemia, they will have a better idea on what to do. SEEK MEDICAL CARE IF:   You are having problems keeping your blood sugar at target range.  You are having frequent episodes of hypoglycemia.  You feel you might be having side effects from your medicines.  You have symptoms of an illness that is not improving after 3-4 days.  You notice a change in vision or a new problem with your vision. SEEK IMMEDIATE MEDICAL CARE IF:   You are a family member or friend of a person whose blood glucose goes below 70 mg/dl and is accompanied by:  Confusion.  A change in mental status.  The inability to swallow.  Passing out. Document Released: 03/17/2005 Document Revised: 06/09/2011 Document Reviewed: 07/14/2011 [facility] Patient Information 2014 [place], [place].

## 2013-07-07 NOTE — ED Provider Notes (Signed)
[name] S [name] 2:00 AM patient discussed in sign out. Patient with hypoglycemia continuing to be monitored. She also has some continued atypical chest pains that are chronic in nature. Multiple prior visits for similar chest pains with unremarkable workup. No EKG changes concerning lab abnormalities. Plan to recheck one more CBG if continues to be in a normal range patient may return home and continue her medications.  2:25AM CBG has not dropped.   At this time she may be discharged home.  Angus Seller, PA-C 07/07/13 [phone]

## 2013-07-07 NOTE — ED Provider Notes (Signed)
CSN: [id number]     Arrival date & time 07/06/13  2215 History   First MD Initiated Contact with Patient 07/06/13 2351     Chief Complaint  Patient presents with  . Chest Pain     (Consider location/radiation/quality/duration/timing/severity/associated sxs/prior Treatment) HPI Comments: Patient is a 23 year old female with history of diabetes, hypertension, and gastroparesis who presents today for generalized pain and chest pain. This has been ongoing for the past 3 days and has not improved. She has taken percocet without relief. She has been evaluated for this multiple times. She has had negative CTA to rule out PE, most recently at the end of February. Additionally she has negative CK given persistent muscle aches. She reports that this is the pain she normally experiences. It is a tight pain which feels like it is in her muscles. Patient also reports that she feels hypoglycemic. This happens to her approximately every 3 days. She measures her blood sugar and takes medication as prescribed. She uses her Lantus at bedtime and humalog with meals. She ate fruit loops and took humalog prior to arrival. She states that generally she eats fruit loops, hot pockets, other similar foods. She denies recent illness, fevers, chills, nausea, vomiting, abdominal pain, shortness of breath.   The history is provided by the patient. No language interpreter was used.    Past Medical History  Diagnosis Date  . Diabetes mellitus   . Hypertension   . Diabetes type 1, uncontrolled 11/14/2011    Since age 59  . Gastroparesis    Past Surgical History  Procedure Laterality Date  . Ankle surgery    . Cholecystectomy  11/15/2011    Procedure: LAPAROSCOPIC CHOLECYSTECTOMY WITH INTRAOPERATIVE CHOLANGIOGRAM;  Surgeon: [name], MD;  Location: [facility];  Service: General;  Laterality: N/A;  . Laparoscopy  11/23/2011    Procedure: LAPAROSCOPY DIAGNOSTIC;  Surgeon: Mariella Saa, MD;  Location: [facility];   Service: General;  Laterality: N/A;  . Esophagogastroduodenoscopy  12/03/2011    Procedure: ESOPHAGOGASTRODUODENOSCOPY (EGD);  Surgeon: Theda Belfast, MD;  Location: [facility];  Service: Endoscopy;  Laterality: N/A;   Family History  Problem Relation Age of Onset  . Diabetes Mother   . Hypertension Father    History  Substance Use Topics  . Smoking status: Never Smoker   . Smokeless tobacco: Never Used  . Alcohol Use: Yes     Comment: Occasional   OB History   Grav Para Term Preterm Abortions TAB SAB Ect Mult Living                 Review of Systems  Constitutional: Negative for fever and chills.  Respiratory: Negative for shortness of breath.   Cardiovascular: Positive for chest pain.  Gastrointestinal: Negative for nausea, vomiting and abdominal pain.  Musculoskeletal: Positive for myalgias.  All other systems reviewed and are negative.     Allergies  Peanut-containing drug products  Home Medications   Current Outpatient Rx  Name  Route  Sig  Dispense  Refill  . cyclobenzaprine (FLEXERIL) 10 MG tablet   Oral   Take 10 mg by mouth 3 (three) times daily as needed for muscle spasms.         . DULoxetine (CYMBALTA) 30 MG capsule   Oral   Take 30 mg by mouth at bedtime.         . gabapentin (NEURONTIN) 400 MG capsule   Oral   Take 400 mg by mouth 4 (four)  times daily.         . insulin glargine (LANTUS) 100 UNIT/ML injection   Subcutaneous   Inject 40 Units into the skin at bedtime.         . insulin lispro (HUMALOG) 100 UNIT/ML injection   Subcutaneous   Inject 5-15 Units into the skin 3 (three) times daily with meals. Inject 5 units (base) and additional units according to sliding scale- sliding scale: CBG 70 - 120: 0 units: CBG 121 - 150: 2 units; CBG 151 - 200: 3 units; CBG 201 - 250: 5 units; CBG 251 - 300: 8 units; CBG 301 - 350: 11 units; CBG 351 - 400: 15 units; CBG > 400 Call MD   10 mL   11   . magnesium oxide (MAG-OX) 400 MG tablet    Oral   Take 400 mg by mouth at bedtime.         . metoCLOPramide (REGLAN) 10 MG tablet   Oral   Take 10 mg by mouth 3 (three) times daily as needed for nausea. Takes with each meal only as needed for nausea.         . naproxen (NAPROSYN) 500 MG tablet   Oral   Take 1 tablet (500 mg total) by mouth 2 (two) times daily with a meal.   30 tablet   0   . oxyCODONE-acetaminophen (PERCOCET) 5-325 MG per tablet   Oral   Take 2 tablets by mouth every 4 (four) hours as needed.   10 tablet   0   . promethazine (PHENERGAN) 25 MG tablet   Oral   Take 25 mg by mouth every 6 (six) hours as needed for nausea or vomiting. For nausea          BP 136/75  Pulse 117  Temp(Src) 98.5 F (36.9 C) (Oral)  Resp 14  SpO2 100%  LMP 06/11/2013 Physical Exam  Nursing note and vitals reviewed. Constitutional: She is oriented to person, place, and time. She appears well-developed and well-nourished. She does not appear ill. No distress.  NAD  HENT:  Head: Normocephalic and atraumatic.  Right Ear: External ear normal.  Left Ear: External ear normal.  Nose: Nose normal.  Mouth/Throat: Oropharynx is clear and moist.  Eyes: Conjunctivae are normal.  Neck: Normal range of motion.  Cardiovascular: Regular rhythm, normal heart sounds, intact distal pulses and normal pulses.  Tachycardia present.   Pulmonary/Chest: Effort normal and breath sounds normal. No stridor. No respiratory distress. She has no wheezes. She has no rales.  Abdominal: Soft. She exhibits no distension.  Musculoskeletal: Normal range of motion.  ttp diffusely over body  Neurological: She is alert and oriented to person, place, and time. She has normal strength.  Skin: Skin is warm and dry. She is not diaphoretic. No erythema.  Psychiatric: She has a normal mood and affect. Her behavior is normal.    ED Course  Procedures (including critical care time) Labs Review Labs Reviewed  BASIC METABOLIC PANEL - Abnormal; Notable for  the following:    Glucose, Bld 69 (*)    Creatinine, Ser 0.39 (*)    All other components within normal limits  CBG MONITORING, ED - Abnormal; Notable for the following:    Glucose-Capillary 39 (*)    All other components within normal limits  CBC  TROPONIN I  CBG MONITORING, ED  CBG MONITORING, ED   Imaging Review No results found.   EKG Interpretation   Date/Time:  Wednesday July 06 2013 22:27:23 EDT Ventricular Rate:  127 PR Interval:  128 QRS Duration: 73 QT Interval:  301 QTC Calculation: 437 R Axis:   75 Text Interpretation:  Sinus tachycardia No significant change since last  tracing Confirmed by WARD,  DO, KRISTEN ([id number]) on 07/06/2013 10:56:22 PM      MDM   Final diagnoses:  Hypoglycemia  Diabetes  Pain    Patient presents to ED for evaluation of chronic myalgias and chest pain. She tells me her chest pain has been constant over the past 3 days, not since 430pm as per nursing note. I doubt this pain is cardiac in nature. She has negative CTA, most recently at the end of February. CK was WNL previously. No concern for ACS, PE, rhabdo causing her symptoms. Patient also hypoglycemic. Likely due to eating fruit loops and then using humalog. Patient was given D50 and blood sugar continued to increase. I counseled patient on diabetes and gave her a handout on a diabetic diet. Encouraged follow up with PCP. Return instructions given. Vital signs stable for discharge as long as last CBG is continuing to increase. Patient signed out to [facility], PA-C at change of shift. Discussed case with Dr. Norlene Campbell who agrees with plan. Patient / Family / Caregiver informed of clinical course, understand medical decision-making process, and agree with plan.     Mora Bellman, PA-C 07/07/13 1251

## 2013-07-07 NOTE — ED Provider Notes (Signed)
Medical screening examination/treatment/procedure(s) were performed by non-physician practitioner and as supervising physician I was immediately available for consultation/collaboration.   EKG Interpretation   Date/Time:  Wednesday July 06 2013 22:27:23 EDT Ventricular Rate:  127 PR Interval:  128 QRS Duration: 73 QT Interval:  301 QTC Calculation: 437 R Axis:   75 Text Interpretation:  Sinus tachycardia No significant change since last  tracing Confirmed by Jadene Pierini, KRISTEN [id number]) on 07/06/2013 10:56:22 PM       Olivia Mackie, MD 07/07/13 2056

## 2013-07-08 LAB — URINALYSIS, ROUTINE W REFLEX MICROSCOPIC
Bilirubin Urine: NEGATIVE
Glucose, UA: >1000 mg/dL — AB
Hgb urine dipstick: NEGATIVE
Ketones, ur: NEGATIVE mg/dL
Leukocytes, UA: NEGATIVE
Nitrite: NEGATIVE
Protein, ur: NEGATIVE mg/dL
Specific Gravity, Urine: 1.037 — ABNORMAL HIGH (ref 1.005–1.030)
Urobilinogen, UA: 0.2 mg/dL (ref 0.0–1.0)
pH: 7 (ref 5.0–8.0)

## 2013-07-08 LAB — CBC WITH DIFFERENTIAL/PLATELET
Basophils Absolute: 0 10*3/uL (ref 0.0–0.1)
Basophils Relative: 1 % (ref 0–1)
Eosinophils Absolute: 0.1 10*3/uL (ref 0.0–0.7)
Eosinophils Relative: 2 % (ref 0–5)
HCT: 35.5 % — ABNORMAL LOW (ref 36.0–46.0)
Hemoglobin: 12.4 g/dL (ref 12.0–15.0)
Lymphocytes Relative: 58 % — ABNORMAL HIGH (ref 12–46)
Lymphs Abs: 3.1 10*3/uL (ref 0.7–4.0)
MCH: 31.2 pg (ref 26.0–34.0)
MCHC: 34.9 g/dL (ref 30.0–36.0)
MCV: 89.2 fL (ref 78.0–100.0)
Monocytes Absolute: 0.3 10*3/uL (ref 0.1–1.0)
Monocytes Relative: 6 % (ref 3–12)
Neutro Abs: 1.7 10*3/uL (ref 1.7–7.7)
Neutrophils Relative %: 33 % — ABNORMAL LOW (ref 43–77)
Platelets: 227 10*3/uL (ref 150–400)
RBC: 3.98 MIL/uL (ref 3.87–5.11)
RDW: 11.5 % (ref 11.5–15.5)
WBC: 5.3 10*3/uL (ref 4.0–10.5)

## 2013-07-08 LAB — CK: Total CK: 80 U/L (ref 7–177)

## 2013-07-08 LAB — PREGNANCY, URINE: Preg Test, Ur: NEGATIVE

## 2013-07-08 LAB — COMPREHENSIVE METABOLIC PANEL
ALT: 46 U/L — ABNORMAL HIGH (ref 0–35)
AST: 49 U/L — ABNORMAL HIGH (ref 0–37)
Albumin: 3.5 g/dL (ref 3.5–5.2)
Alkaline Phosphatase: 88 U/L (ref 39–117)
BUN: 8 mg/dL (ref 6–23)
CO2: 27 mEq/L (ref 19–32)
Calcium: 9.7 mg/dL (ref 8.4–10.5)
Chloride: 99 mEq/L (ref 96–112)
Creatinine, Ser: 0.37 mg/dL — ABNORMAL LOW (ref 0.50–1.10)
GFR calc Af Amer: >90 mL/min (ref >90)
GFR calc non Af Amer: >90 mL/min (ref >90)
Glucose, Bld: 77 mg/dL (ref 70–99)
Potassium: 3.8 mEq/L (ref 3.7–5.3)
Sodium: 139 mEq/L (ref 137–147)
Total Bilirubin: 0.3 mg/dL (ref 0.3–1.2)
Total Protein: 7.3 g/dL (ref 6.0–8.3)

## 2013-07-08 LAB — URINE MICROSCOPIC-ADD ON

## 2013-07-08 LAB — TROPONIN I: Troponin I: <0.3 ng/mL (ref <0.30)

## 2013-07-08 MED ORDER — KETOROLAC TROMETHAMINE 30 MG/ML IJ SOLN
30.0000 mg | Freq: Once | INTRAMUSCULAR | Status: AC
  Administered 2013-07-08: 30 mg via INTRAVENOUS
  Filled 2013-07-08: qty 1

## 2013-07-08 MED ORDER — LORAZEPAM 2 MG/ML IJ SOLN
1.0000 mg | Freq: Once | INTRAMUSCULAR | Status: AC
  Administered 2013-07-08: 1 mg via INTRAVENOUS
  Filled 2013-07-08: qty 1

## 2013-07-08 NOTE — ED Notes (Signed)
Pt sts she doesn't feel any better now than she did when she came in here.  Chest pain is still the same. Heart rate still elevated.

## 2013-07-08 NOTE — ED Provider Notes (Signed)
CSN: [id number]     Arrival date & time 07/07/13  2148 History   First MD Initiated Contact with Patient 07/07/13 2346     Chief Complaint  Patient presents with  . Chest Pain  . Generalized Body Aches   HPI  In the history provided by the patient in recent medical charts. Patient is a 23 year old female with history of type 1 diabetes, tachycardia and gastroparesis who presents with complaints of diffuse body and extremity pain as well as chest pains. Patient has multiple prior visits to the emergency room for similar symptoms. She has had significant workups for her symptoms outpatient without any specific cause. Her symptoms have been ongoing for the past several days. She was evaluated last night in emergency room was found to be slightly hypoglycemic from her insulin use. This did normalize and her laboratory testing and workup was otherwise unchanged. Patient states her symptoms however have persisted. She denies any associated shortness of breath. No swelling of the arms or joints. No skin changes or rash. No fever, chills or sweats.    Past Medical History  Diagnosis Date  . Diabetes mellitus   . Hypertension   . Diabetes type 1, uncontrolled 11/14/2011    Since age 82  . Gastroparesis    Past Surgical History  Procedure Laterality Date  . Ankle surgery    . Cholecystectomy  11/15/2011    Procedure: LAPAROSCOPIC CHOLECYSTECTOMY WITH INTRAOPERATIVE CHOLANGIOGRAM;  Surgeon: [name], MD;  Location: [facility];  Service: General;  Laterality: N/A;  . Laparoscopy  11/23/2011    Procedure: LAPAROSCOPY DIAGNOSTIC;  Surgeon: Mariella Saa, MD;  Location: [facility];  Service: General;  Laterality: N/A;  . Esophagogastroduodenoscopy  12/03/2011    Procedure: ESOPHAGOGASTRODUODENOSCOPY (EGD);  Surgeon: Theda Belfast, MD;  Location: [facility];  Service: Endoscopy;  Laterality: N/A;   Family History  Problem Relation Age of Onset  . Diabetes Mother   . Hypertension Father     History  Substance Use Topics  . Smoking status: Never Smoker   . Smokeless tobacco: Never Used  . Alcohol Use: Yes     Comment: Occasional   OB History   Grav Para Term Preterm Abortions TAB SAB Ect Mult Living                 Review of Systems  Constitutional: Negative for fever, chills and diaphoresis.  Respiratory: Negative for shortness of breath.   Cardiovascular: Positive for chest pain.  Gastrointestinal: Negative for nausea, vomiting, diarrhea and constipation.  Musculoskeletal: Positive for back pain and myalgias.  All other systems reviewed and are negative.     Allergies  Peanut-containing drug products  Home Medications   Current Outpatient Rx  Name  Route  Sig  Dispense  Refill  . cyclobenzaprine (FLEXERIL) 10 MG tablet   Oral   Take 10 mg by mouth 3 (three) times daily as needed for muscle spasms.         . DULoxetine (CYMBALTA) 30 MG capsule   Oral   Take 30 mg by mouth at bedtime.         . gabapentin (NEURONTIN) 400 MG capsule   Oral   Take 400 mg by mouth 4 (four) times daily.         . insulin glargine (LANTUS) 100 UNIT/ML injection   Subcutaneous   Inject 40 Units into the skin at bedtime.         . insulin lispro (HUMALOG) 100 UNIT/ML  injection   Subcutaneous   Inject 5-15 Units into the skin 3 (three) times daily with meals. Inject 5 units (base) and additional units according to sliding scale- sliding scale: CBG 70 - 120: 0 units: CBG 121 - 150: 2 units; CBG 151 - 200: 3 units; CBG 201 - 250: 5 units; CBG 251 - 300: 8 units; CBG 301 - 350: 11 units; CBG 351 - 400: 15 units; CBG > 400 Call MD   10 mL   11   . magnesium oxide (MAG-OX) 400 MG tablet   Oral   Take 400 mg by mouth at bedtime.         . metoCLOPramide (REGLAN) 10 MG tablet   Oral   Take 10 mg by mouth 3 (three) times daily as needed for nausea. Takes with each meal only as needed for nausea.         . naproxen (NAPROSYN) 500 MG tablet   Oral   Take 1  tablet (500 mg total) by mouth 2 (two) times daily with a meal.   30 tablet   0   . oxyCODONE-acetaminophen (PERCOCET/ROXICET) 5-325 MG per tablet   Oral   Take 2 tablets by mouth every 4 (four) hours as needed for severe pain.         . promethazine (PHENERGAN) 25 MG tablet   Oral   Take 25 mg by mouth every 6 (six) hours as needed for nausea or vomiting. For nausea          BP 127/77  Pulse 135  Temp(Src) 99.4 F (37.4 C) (Oral)  Resp 22  SpO2 100%  LMP 06/11/2013 Physical Exam  Nursing note and vitals reviewed. Constitutional: She is oriented to person, place, and time. She appears well-developed and well-nourished. No distress.  HENT:  Head: Normocephalic and atraumatic.  Mouth/Throat: Oropharynx is clear and moist.  Eyes: Conjunctivae are normal.  Neck: Normal range of motion. Neck supple.  Cardiovascular: Regular rhythm.  Tachycardia present.   No murmur heard. Pulmonary/Chest: Effort normal and breath sounds normal. No respiratory distress. She has no wheezes. She has no rales. She exhibits no tenderness.  Abdominal: Soft. There is no tenderness. There is no rebound.  Musculoskeletal: Normal range of motion. She exhibits no edema and no tenderness.  Neurological: She is alert and oriented to person, place, and time.  Skin: Skin is warm and dry. No rash noted.  Psychiatric: She has a normal mood and affect. Her behavior is normal.    ED Course  Procedures   COORDINATION OF CARE:  Nursing notes reviewed. Vital signs reviewed. Initial pt interview and examination performed.   Filed Vitals:   07/07/13 2200  BP: 127/77  Pulse: 135  Temp: 99.4 F (37.4 C)  TempSrc: Oral  Resp: 22  SpO2: 100%    1:23 AM-patient seen and evaluated. She is resting calmly in bed appears well in no acute distress. Does not appear in any significant pain or discomfort. Sinus tach at 118 on the monitor. No shortness of breath. Normal respirations and O2 sats.  Patient past  medical records reviewed. She has had multiple prior evaluations for body aches and pains. Her workups have been normal. She is followed by primary care provider and has seen several specialists including cardiologist for her tachycardia. She was on beta blocker at one time but this lowered her blood pressure too much and she was near syncopal and this was discontinued. She has followed with a rheumatologist without any findings  her symptoms. She does report an upcoming appointment with endocrinologist next Tuesday.  Heart rate improved slightly after pain meds and ativan. Still tachycardic. Patient has prior history of tachycardia for which she is followed by cardiologist. Multiple past visits the emergency room with tachycardia. I discussed with patient the need to continue to followup with her cardiologist tomorrow for her tachycardia as well as her doctors for continued evaluation of her ongoing pain. She agrees with plan.   Treatment plan initiated: Medications  LORazepam (ATIVAN) injection 1 mg (not administered)  ketorolac (TORADOL) 30 MG/ML injection 30 mg (not administered)   Results for orders placed during the hospital encounter of 07/07/13  CBC WITH DIFFERENTIAL      Result Value Ref Range   WBC 5.3  4.0 - 10.5 K/uL   RBC 3.98  3.87 - 5.11 MIL/uL   Hemoglobin 12.4  12.0 - 15.0 g/dL   HCT 16.135.5 (*) 09.636.0 - 04.546.0 %   MCV 89.2  78.0 - 100.0 fL   MCH 31.2  26.0 - 34.0 pg   MCHC 34.9  30.0 - 36.0 g/dL   RDW 40.911.5  81.111.5 - 91.415.5 %   Platelets 227  150 - 400 K/uL   Neutrophils Relative % 33 (*) 43 - 77 %   Neutro Abs 1.7  1.7 - 7.7 K/uL   Lymphocytes Relative 58 (*) 12 - 46 %   Lymphs Abs 3.1  0.7 - 4.0 K/uL   Monocytes Relative 6  3 - 12 %   Monocytes Absolute 0.3  0.1 - 1.0 K/uL   Eosinophils Relative 2  0 - 5 %   Eosinophils Absolute 0.1  0.0 - 0.7 K/uL   Basophils Relative 1  0 - 1 %   Basophils Absolute 0.0  0.0 - 0.1 K/uL  COMPREHENSIVE METABOLIC PANEL      Result Value Ref  Range   Sodium 139  137 - 147 mEq/L   Potassium 3.8  3.7 - 5.3 mEq/L   Chloride 99  96 - 112 mEq/L   CO2 27  19 - 32 mEq/L   Glucose, Bld 77  70 - 99 mg/dL   BUN 8  6 - 23 mg/dL   Creatinine, Ser 7.820.37 (*) 0.50 - 1.10 mg/dL   Calcium 9.7  8.4 - 95.610.5 mg/dL   Total Protein 7.3  6.0 - 8.3 g/dL   Albumin 3.5  3.5 - 5.2 g/dL   AST 49 (*) 0 - 37 U/L   ALT 46 (*) 0 - 35 U/L   Alkaline Phosphatase 88  39 - 117 U/L   Total Bilirubin 0.3  0.3 - 1.2 mg/dL   GFR calc non Af Amer >90  >90 mL/min   GFR calc Af Amer >90  >90 mL/min  CK      Result Value Ref Range   Total CK 80  7 - 177 U/L  URINALYSIS, ROUTINE W REFLEX MICROSCOPIC      Result Value Ref Range   Color, Urine YELLOW  YELLOW   APPearance CLEAR  CLEAR   Specific Gravity, Urine 1.037 (*) 1.005 - 1.030   pH 7.0  5.0 - 8.0   Glucose, UA >1000 (*) NEGATIVE mg/dL   Hgb urine dipstick NEGATIVE  NEGATIVE   Bilirubin Urine NEGATIVE  NEGATIVE   Ketones, ur NEGATIVE  NEGATIVE mg/dL   Protein, ur NEGATIVE  NEGATIVE mg/dL   Urobilinogen, UA 0.2  0.0 - 1.0 mg/dL   Nitrite NEGATIVE  NEGATIVE   Leukocytes,  UA NEGATIVE  NEGATIVE  PREGNANCY, URINE      Result Value Ref Range   Preg Test, Ur NEGATIVE  NEGATIVE  TROPONIN I      Result Value Ref Range   Troponin I <0.30  <0.30 ng/mL  URINE MICROSCOPIC-ADD ON      Result Value Ref Range   Squamous Epithelial / LPF RARE  RARE   WBC, UA 0-2  <3 WBC/hpf   RBC / HPF 0-2  <3 RBC/hpf   Bacteria, UA RARE  RARE          EKG Interpretation None      Date: 07/08/2013  Rate: 135  Rhythm: sinus tachycardia  QRS Axis: normal  Intervals: normal  ST/T Wave abnormalities: normal  Conduction Disutrbances:none  Narrative Interpretation: borderline prolonged QT interval.    Old EKG Reviewed: No significant changes from 07/06/2013      MDM   Final diagnoses:  Myalgia  Atypical chest pain  Tachycardia  Elevated LFTs      Angus Seller, PA-C 07/08/13 2135

## 2013-07-08 NOTE — Discharge Instructions (Signed)
Your lab testing did not show any signs of concerning or emergent causes for your continued symptoms. Please followup with your primary care provider and specialists for continued evaluation and treatment. Have a recheck of your lab testing to be sure there are no changes in your liver enzymes.   Musculoskeletal Pain Musculoskeletal pain is muscle and boney aches and pains. These pains can occur in any part of the body. Your caregiver may treat you without knowing the cause of the pain. They may treat you if blood or urine tests, X-rays, and other tests were normal.  CAUSES There is often not a definite cause or reason for these pains. These pains may be caused by a type of germ (virus). The discomfort may also come from overuse. Overuse includes working out too hard when your body is not fit. Boney aches also come from weather changes. Bone is sensitive to atmospheric pressure changes. HOME CARE INSTRUCTIONS   Ask when your test results will be ready. Make sure you get your test results.  Only take over-the-counter or prescription medicines for pain, discomfort, or fever as directed by your caregiver. If you were given medications for your condition, do not drive, operate machinery or power tools, or sign legal documents for 24 hours. Do not drink alcohol. Do not take sleeping pills or other medications that may interfere with treatment.  Continue all activities unless the activities cause more pain. When the pain lessens, slowly resume normal activities. Gradually increase the intensity and duration of the activities or exercise.  During periods of severe pain, bed rest may be helpful. Lay or sit in any position that is comfortable.  Putting ice on the injured area.  Put ice in a bag.  Place a towel between your skin and the bag.  Leave the ice on for 15 to 20 minutes, 3 to 4 times a day.  Follow up with your caregiver for continued problems and no reason can be found for the pain. If the  pain becomes worse or does not go away, it may be necessary to repeat tests or do additional testing. Your caregiver may need to look further for a possible cause. SEEK IMMEDIATE MEDICAL CARE IF:  You have pain that is getting worse and is not relieved by medications.  You develop chest pain that is associated with shortness or breath, sweating, feeling sick to your stomach (nauseous), or throw up (vomit).  Your pain becomes localized to the abdomen.  You develop any new symptoms that seem different or that concern you. MAKE SURE YOU:   Understand these instructions.  Will watch your condition.  Will get help right away if you are not doing well or get worse. Document Released: 03/17/2005 Document Revised: 06/09/2011 Document Reviewed: 11/19/2012 [facility] Patient Information 2014 [place], [place].

## 2013-07-12 ENCOUNTER — Emergency Department (HOSPITAL_COMMUNITY)
Admission: EM | Admit: 2013-07-12 | Discharge: 2013-07-13 | Disposition: A | Discharge status: Home / Self Care | Payer: 59 | Attending: Emergency Medicine | Admitting: Emergency Medicine

## 2013-07-12 ENCOUNTER — Encounter (HOSPITAL_COMMUNITY): Payer: Self-pay | Admitting: Emergency Medicine

## 2013-07-12 DIAGNOSIS — I10 Essential (primary) hypertension: Secondary | ICD-10-CM | POA: Insufficient documentation

## 2013-07-12 DIAGNOSIS — M545 Low back pain, unspecified: Secondary | ICD-10-CM

## 2013-07-12 DIAGNOSIS — R000 Tachycardia, unspecified: Secondary | ICD-10-CM | POA: Insufficient documentation

## 2013-07-12 DIAGNOSIS — Z3202 Encounter for pregnancy test, result negative: Secondary | ICD-10-CM | POA: Insufficient documentation

## 2013-07-12 DIAGNOSIS — Z8719 Personal history of other diseases of the digestive system: Secondary | ICD-10-CM | POA: Insufficient documentation

## 2013-07-12 DIAGNOSIS — IMO0002 Reserved for concepts with insufficient information to code with codable children: Secondary | ICD-10-CM | POA: Insufficient documentation

## 2013-07-12 DIAGNOSIS — Z791 Long term (current) use of non-steroidal anti-inflammatories (NSAID): Secondary | ICD-10-CM | POA: Insufficient documentation

## 2013-07-12 DIAGNOSIS — Z794 Long term (current) use of insulin: Secondary | ICD-10-CM | POA: Insufficient documentation

## 2013-07-12 DIAGNOSIS — R739 Hyperglycemia, unspecified: Secondary | ICD-10-CM

## 2013-07-12 DIAGNOSIS — Z79899 Other long term (current) drug therapy: Secondary | ICD-10-CM | POA: Insufficient documentation

## 2013-07-12 DIAGNOSIS — E1065 Type 1 diabetes mellitus with hyperglycemia: Secondary | ICD-10-CM | POA: Insufficient documentation

## 2013-07-12 LAB — CBC WITH DIFFERENTIAL/PLATELET
Basophils Absolute: 0 10*3/uL (ref 0.0–0.1)
Basophils Relative: 0 % (ref 0–1)
Eosinophils Absolute: 0.1 10*3/uL (ref 0.0–0.7)
Eosinophils Relative: 1 % (ref 0–5)
HCT: 37.5 % (ref 36.0–46.0)
Hemoglobin: 13.1 g/dL (ref 12.0–15.0)
Lymphocytes Relative: 42 % (ref 12–46)
Lymphs Abs: 3 10*3/uL (ref 0.7–4.0)
MCH: 30.7 pg (ref 26.0–34.0)
MCHC: 34.9 g/dL (ref 30.0–36.0)
MCV: 87.8 fL (ref 78.0–100.0)
Monocytes Absolute: 0.4 10*3/uL (ref 0.1–1.0)
Monocytes Relative: 6 % (ref 3–12)
Neutro Abs: 3.6 10*3/uL (ref 1.7–7.7)
Neutrophils Relative %: 51 % (ref 43–77)
Platelets: 269 10*3/uL (ref 150–400)
RBC: 4.27 MIL/uL (ref 3.87–5.11)
RDW: 11.6 % (ref 11.5–15.5)
WBC: 7 10*3/uL (ref 4.0–10.5)

## 2013-07-12 LAB — PREGNANCY, URINE: Preg Test, Ur: NEGATIVE

## 2013-07-12 LAB — CBG MONITORING, ED: Glucose-Capillary: 447 mg/dL — ABNORMAL HIGH (ref 70–99)

## 2013-07-12 NOTE — ED Notes (Signed)
Patient is alert and oriented x3.  She is complaining of right flank pain that started 3 hours ago. Patient denies ever having this issue before.  She does states there is a family history of kidney stones. Currently she rates the pain 10 of 10 without nausea.

## 2013-07-13 LAB — COMPREHENSIVE METABOLIC PANEL
ALT: 45 U/L — ABNORMAL HIGH (ref 0–35)
AST: 48 U/L — ABNORMAL HIGH (ref 0–37)
Albumin: 3.6 g/dL (ref 3.5–5.2)
Alkaline Phosphatase: 100 U/L (ref 39–117)
BUN: 11 mg/dL (ref 6–23)
CO2: 26 mEq/L (ref 19–32)
Calcium: 9.4 mg/dL (ref 8.4–10.5)
Chloride: 94 mEq/L — ABNORMAL LOW (ref 96–112)
Creatinine, Ser: 0.44 mg/dL — ABNORMAL LOW (ref 0.50–1.10)
GFR calc Af Amer: >90 mL/min (ref >90)
GFR calc non Af Amer: >90 mL/min (ref >90)
Glucose, Bld: 458 mg/dL — ABNORMAL HIGH (ref 70–99)
Potassium: 5.4 mEq/L — ABNORMAL HIGH (ref 3.7–5.3)
Sodium: 131 mEq/L — ABNORMAL LOW (ref 137–147)
Total Bilirubin: <0.2 mg/dL — ABNORMAL LOW (ref 0.3–1.2)
Total Protein: 7.3 g/dL (ref 6.0–8.3)

## 2013-07-13 LAB — URINE MICROSCOPIC-ADD ON

## 2013-07-13 LAB — URINALYSIS, ROUTINE W REFLEX MICROSCOPIC
Bilirubin Urine: NEGATIVE
Glucose, UA: >1000 mg/dL — AB
Ketones, ur: NEGATIVE mg/dL
Leukocytes, UA: NEGATIVE
Nitrite: NEGATIVE
Protein, ur: NEGATIVE mg/dL
Specific Gravity, Urine: 1.037 — ABNORMAL HIGH (ref 1.005–1.030)
Urobilinogen, UA: 0.2 mg/dL (ref 0.0–1.0)
pH: 7.5 (ref 5.0–8.0)

## 2013-07-13 LAB — CBG MONITORING, ED: Glucose-Capillary: 205 mg/dL — ABNORMAL HIGH (ref 70–99)

## 2013-07-13 MED ORDER — NAPROXEN 500 MG PO TABS: 500.0000 mg | ORAL_TABLET | Freq: Two times a day (BID) | ORAL | Status: DC

## 2013-07-13 MED ORDER — ONDANSETRON HCL 4 MG/2ML IJ SOLN
4.0000 mg | Freq: Once | INTRAMUSCULAR | Status: AC
  Administered 2013-07-13: 4 mg via INTRAVENOUS
  Filled 2013-07-13: qty 2

## 2013-07-13 MED ORDER — SODIUM CHLORIDE 0.9 % IV BOLUS (SEPSIS)
1000.0000 mL | Freq: Once | INTRAVENOUS | Status: AC
  Administered 2013-07-13: 1000 mL via INTRAVENOUS

## 2013-07-13 MED ORDER — INSULIN ASPART 100 UNIT/ML ~~LOC~~ SOLN
10.0000 [IU] | Freq: Once | SUBCUTANEOUS | Status: AC
  Administered 2013-07-13: 10 [IU] via SUBCUTANEOUS
  Filled 2013-07-13: qty 1

## 2013-07-13 MED ORDER — KETOROLAC TROMETHAMINE 30 MG/ML IJ SOLN
30.0000 mg | Freq: Once | INTRAMUSCULAR | Status: AC
  Administered 2013-07-13: 30 mg via INTRAVENOUS
  Filled 2013-07-13: qty 1

## 2013-07-13 NOTE — ED Provider Notes (Addendum)
TIME SEEN: 12:01 AM  CHIEF COMPLAINT: Right lower back pain  HPI: Patient is a 23 y.o. F with history of tachycardia, insulin-dependent diabetes, hypertension, gastroparesis who presents emergency department with right flank pain that started tonight. She denies any aggravating or relieving factors. She is unable to further characterize this pain. Denies any radiation. Denies a history of kidney stones. Denies any fevers, chills, chest pain or shortness of breath, nausea, vomiting or diarrhea, placed on melena, vaginal discharge, dysuria or hematuria. She is currently on her menstrual cycle. No numbness, tingling or focal weakness. No bowel or bladder incontinence. Of note, this patient's 29th visit to the emergency department in 6 months.  ROS: See HPI Constitutional: no fever  Eyes: no drainage  ENT: no runny nose   Cardiovascular:  no chest pain  Resp: no SOB  GI: no vomiting GU: no dysuria Integumentary: no rash  Allergy: no hives  Musculoskeletal: no leg swelling  Neurological: no slurred speech ROS otherwise negative  PAST MEDICAL HISTORY/PAST SURGICAL HISTORY:  Past Medical History  Diagnosis Date  . Diabetes mellitus   . Hypertension   . Diabetes type 1, uncontrolled 11/14/2011    Since age 40  . Gastroparesis     MEDICATIONS:  Prior to Admission medications   Medication Sig Start Date End Date Taking? Authorizing Provider  cyclobenzaprine (FLEXERIL) 10 MG tablet Take 10 mg by mouth 3 (three) times daily as needed for muscle spasms. 05/21/13  Yes Janice Norrie, MD  DULoxetine (CYMBALTA) 30 MG capsule Take 30 mg by mouth at bedtime.   Yes Historical Provider, MD  gabapentin (NEURONTIN) 400 MG capsule Take 400 mg by mouth 4 (four) times daily.   Yes Historical Provider, MD  insulin glargine (LANTUS) 100 UNIT/ML injection Inject 40 Units into the skin at bedtime.   Yes Historical Provider, MD  insulin lispro (HUMALOG) 100 UNIT/ML injection Inject 5-15 Units into the skin 3  (three) times daily with meals. Inject 5 units (base) and additional units according to sliding scale- sliding scale: CBG 70 - 120: 0 units: CBG 121 - 150: 2 units; CBG 151 - 200: 3 units; CBG 201 - 250: 5 units; CBG 251 - 300: 8 units; CBG 301 - 350: 11 units; CBG 351 - 400: 15 units; CBG > 400 Call MD 06/12/13  Yes Thurnell Lose, MD  magnesium oxide (MAG-OX) 400 MG tablet Take 400 mg by mouth at bedtime.   Yes Historical Provider, MD  metoCLOPramide (REGLAN) 10 MG tablet Take 10 mg by mouth 3 (three) times daily as needed for nausea. Takes with each meal only as needed for nausea. 03/16/12  Yes Bynum Bellows, MD  naproxen (NAPROSYN) 500 MG tablet Take 1 tablet (500 mg total) by mouth 2 (two) times daily with a meal. 06/16/13  Yes Johnna Acosta, MD  oxyCODONE-acetaminophen (PERCOCET/ROXICET) 5-325 MG per tablet Take 2 tablets by mouth every 4 (four) hours as needed for severe pain.   Yes Historical Provider, MD  promethazine (PHENERGAN) 25 MG tablet Take 25 mg by mouth every 6 (six) hours as needed for nausea or vomiting. For nausea   Yes Historical Provider, MD    ALLERGIES:  Allergies  Allergen Reactions  . Peanut-Containing Drug Products Anaphylaxis    [place] nuts specifically      SOCIAL HISTORY:  History  Substance Use Topics  . Smoking status: Never Smoker   . Smokeless tobacco: Never Used  . Alcohol Use: Yes     Comment: Occasional  FAMILY HISTORY: Family History  Problem Relation Age of Onset  . Diabetes Mother   . Hypertension Father     EXAM: BP 122/87  Pulse 120  Temp(Src) 98.1 F (36.7 C) (Oral)  Resp 18  SpO2 100%  LMP 07/12/2013 CONSTITUTIONAL: Alert and oriented and responds appropriately to questions. Well-appearing; well-nourished, nontoxic, in no apparent distress HEAD: Normocephalic EYES: Conjunctivae clear, PERRL ENT: normal nose; no rhinorrhea; moist mucous membranes; pharynx without lesions noted NECK: Supple, no meningismus, no LAD  CARD:  Regular and tachycardic; S1 and S2 appreciated; no murmurs, no clicks, no rubs, no gallops RESP: Normal chest excursion without splinting or tachypnea; breath sounds clear and equal bilaterally; no wheezes, no rhonchi, no rales,  ABD/GI: Normal bowel sounds; non-distended; soft, non-tender, no rebound, no guarding BACK:  The back appears normal and is non-tender to palpation, there is no CVA tenderness, no midline spinal tenderness or step-off or deformity EXT: Normal ROM in all joints; non-tender to palpation; no edema; normal capillary refill; no cyanosis    SKIN: Normal color for age and race; warm NEURO: Moves all extremities equally; normal gait, strength 5/5 in all 4 extremity, sensation to light touch intact diffusely, cranial nerves II through XII intact PSYCH: The patient's mood and manner are appropriate. Grooming and personal hygiene are appropriate.  MEDICAL DECISION MAKING: Patient here with likely lumbosacral strain. We'll check urinalysis to evaluate for possible kidney stone versus UTI versus palate nephritis. Patient is tachycardic but this is her baseline. She is otherwise hemodynamically stable, afebrile. After my evaluation of the patient, she immediately asks what I am going to give her for pain. Have discussed with patient that I feel that due to her frequent emergency department visits I am concerned for narcotic seeking behavior and I do not feel narcotics are indicated today. We'll give Toradol and Zofran and reassess. She verbalizes understanding and states she is comfortable with this plan.  ED PROGRESS: Patient's labs show mild hyperkalemia and hyperglycemia but a normal anion gap. We'll continue to hydrate and give insulin. She states she feels better after Toradol. Her urine shows no sign of infection and her urine pregnancy test is negative. Her urine shows trace hemoglobin but this is likely due to being on her menstrual cycle.   Patient's blood sugar has improved with  IV fluids and insulin. We'll discharge home with prescription for naproxen and return precautions. Patient verbalizes understanding is comfortable with plan     EKG Interpretation  Date/Time:  Wednesday July 13 2013 00:06:06 EDT Ventricular Rate:  108 PR Interval:  131 QRS Duration: 75 QT Interval:  332 QTC Calculation: 445 R Axis:   65 Text Interpretation:  Sinus tachycardia Confirmed by Yosgart Pavey,  DO, Maribell Demeo [phone]) on 07/13/2013 1:42:21 AM         Delice Bison Tehilla Coffel, DO 07/13/13 [facility] Jaskaran Dauzat, DO 07/13/13 0459

## 2013-07-13 NOTE — Discharge Instructions (Signed)
Back Exercises Back exercises help treat and prevent back injuries. The goal of back exercises is to increase the strength of your abdominal and back muscles and the flexibility of your back. These exercises should be started when you no longer have back pain. Back exercises include:  Pelvic Tilt. Lie on your back with your knees bent. Tilt your pelvis until the lower part of your back is against the floor. Hold this position 5 to 10 sec and repeat 5 to 10 times.  Knee to Chest. Pull first 1 knee up against your chest and hold for 20 to 30 seconds, repeat this with the other knee, and then both knees. This may be done with the other leg straight or bent, whichever feels better.  Sit-Ups or Curl-Ups. Bend your knees 90 degrees. Start with tilting your pelvis, and do a partial, slow sit-up, lifting your trunk only 30 to 45 degrees off the floor. Take at least 2 to 3 seconds for each sit-up. Do not do sit-ups with your knees out straight. If partial sit-ups are difficult, simply do the above but with only tightening your abdominal muscles and holding it as directed.  Hip-Lift. Lie on your back with your knees flexed 90 degrees. Push down with your feet and shoulders as you raise your hips a couple inches off the floor; hold for 10 seconds, repeat 5 to 10 times.  Back arches. Lie on your stomach, propping yourself up on bent elbows. Slowly press on your hands, causing an arch in your low back. Repeat 3 to 5 times. Any initial stiffness and discomfort should lessen with repetition over time.  Shoulder-Lifts. Lie face down with arms beside your body. Keep hips and torso pressed to floor as you slowly lift your head and shoulders off the floor. Do not overdo your exercises, especially in the beginning. Exercises may cause you some mild back discomfort which lasts for a few minutes; however, if the pain is more severe, or lasts for more than 15 minutes, do not continue exercises until you see your caregiver.  Improvement with exercise therapy for back problems is slow.  See your caregivers for assistance with developing a proper back exercise program. Document Released: 04/24/2004 Document Revised: 06/09/2011 Document Reviewed: 01/16/2011 [facility] Patient Information 2014 [place].  Lumbosacral Strain Lumbosacral strain is a strain of any of the parts that make up your lumbosacral vertebrae. Your lumbosacral vertebrae are the bones that make up the lower third of your backbone. Your lumbosacral vertebrae are held together by muscles and tough, fibrous tissue (ligaments).  CAUSES  A sudden blow to your back can cause lumbosacral strain. Also, anything that causes an excessive stretch of the muscles in the low back can cause this strain. This is typically seen when people exert themselves strenuously, fall, lift heavy objects, bend, or crouch repeatedly. RISK FACTORS  Physically demanding work.  Participation in pushing or pulling sports or sports that require sudden twist of the back (tennis, golf, baseball).  Weight lifting.  Excessive lower back curvature.  Forward-tilted pelvis.  Weak back or abdominal muscles or both.  Tight hamstrings. SIGNS AND SYMPTOMS  Lumbosacral strain may cause pain in the area of your injury or pain that moves (radiates) down your leg.  DIAGNOSIS Your health care provider can often diagnose lumbosacral strain through a physical exam. In some cases, you may need tests such as X-ray exams.  TREATMENT  Treatment for your lower back injury depends on many factors that your clinician will have  to evaluate. However, most treatment will include the use of anti-inflammatory medicines. HOME CARE INSTRUCTIONS   Avoid hard physical activities (tennis, racquetball, waterskiing) if you are not in proper physical condition for it. This may aggravate or create problems.  If you have a back problem, avoid sports requiring sudden body movements. Swimming and walking are  generally safer activities.  Maintain good posture.  Maintain a healthy weight.  For acute conditions, you may put ice on the injured area.  Put ice in a plastic bag.  Place a towel between your skin and the bag.  Leave the ice on for 20 minutes, 2 3 times a day.  When the low back starts healing, stretching and strengthening exercises may be recommended. SEEK MEDICAL CARE IF:  Your back pain is getting worse.  You experience severe back pain not relieved with medicines. SEEK IMMEDIATE MEDICAL CARE IF:   You have numbness, tingling, weakness, or problems with the use of your arms or legs.  There is a change in bowel or bladder control.  You have increasing pain in any area of the body, including your belly (abdomen).  You notice shortness of breath, dizziness, or feel faint.  You feel sick to your stomach (nauseous), are throwing up (vomiting), or become sweaty.  You notice discoloration of your toes or legs, or your feet get very cold. MAKE SURE YOU:   Understand these instructions.  Will watch your condition.  Will get help right away if you are not doing well or get worse. Document Released: 12/25/2004 Document Revised: 01/05/2013 Document Reviewed: 11/03/2012 [facility] Patient Information 2014 [place], [place].  High Blood Sugar High blood sugar (hyperglycemia) means that the level of sugar in your blood is higher than it should be. Signs of high blood sugar include:  Feeling thirsty.  Frequent peeing (urinating).  Feeling tired or sleepy.  Dry mouth.  Vision changes.  Feeling weak.  Feeling hungry but losing weight.  Numbness and tingling in your hands or feet.  Headache. When you ignore these signs, your blood sugar may keep going up. These problems may get worse, and other problems may begin. HOME CARE  Check your blood sugars as told by your doctor. Write down the numbers with the date and time.  Take the right amount of insulin or diabetes  pills at the right time. Write down the dose with date and time.  Refill your insulin or diabetes pills before running out.  Watch what you eat. Follow your meal plan.  Drink liquids without sugar, such as water. Check with your doctor if you have kidney or heart disease.  Follow your doctor's orders for exercise. Exercise at the same time of day.  Keep your doctor's appointments. GET HELP RIGHT AWAY IF:   You have trouble thinking or are confused.  You have fast breathing with fruity smelling breath.  You pass out (faint).  You have 2 to 3 days of high blood sugars and you do not know why.  You have chest pain.  You are feeling sick to your stomach (nauseous) or throwing up (vomiting).  You have sudden vision changes. MAKE SURE YOU:   Understand these instructions.  Will watch your condition.  Will get help right away if you are not doing well or get worse. Document Released: 01/12/2009 Document Revised: 06/09/2011 Document Reviewed: 01/12/2009 [facility] Patient Information 2014 [place], [place].  Nonspecific Tachycardia Tachycardia is a faster than normal heartbeat (more than 100 beats per minute). In adults, the heart normally  beats between 60 and 100 times a minute. A fast heartbeat may be a normal response to exercise or stress. It does not necessarily mean that something is wrong. However, sometimes when your heart beats too fast it may not be able to pump enough blood to the rest of your body. This can result in chest pain, shortness of breath, dizziness, and even fainting. Nonspecific tachycardia means that the specific cause or pattern of your tachycardia is unknown. CAUSES  Tachycardia may be harmless or it may be due to a more serious underlying cause. Possible causes of tachycardia include:  Exercise or exertion.  Fever.  Pain or injury.  Infection.  Loss of body fluids (dehydration).  Overactive thyroid.  Lack of red blood cells (anemia).  Anxiety and  stress.  Alcohol.  Caffeine.  Tobacco products.  Diet pills.  Illegal drugs.  Heart disease. SYMPTOMS  Rapid or irregular heartbeat (palpitations).  Suddenly feeling your heart beating (cardiac awareness).  Dizziness.  Tiredness (fatigue).  Shortness of breath.  Chest pain.  Nausea.  Fainting. DIAGNOSIS  Your caregiver will perform a physical exam and take your medical history. In some cases, a heart specialist (cardiologist) may be consulted. Your caregiver may also order:  Blood tests.  Electrocardiography. This test records the electrical activity of your heart.  A heart monitoring test. TREATMENT  Treatment will depend on the likely cause of your tachycardia. The goal is to treat the underlying cause of your tachycardia. Treatment methods may include:  Replacement of fluids or blood through an intravenous (IV) tube for moderate to severe dehydration or anemia.  New medicines or changes in your current medicines.  Diet and lifestyle changes.  Treatment for certain infections.  Stress relief or relaxation methods. HOME CARE INSTRUCTIONS   Rest.  Drink enough fluids to keep your urine clear or pale yellow.  Do not smoke.  Avoid:  Caffeine.  Tobacco.  Alcohol.  Chocolate.  Stimulants such as over-the-counter diet pills or pills that help you stay awake.  Situations that cause anxiety or stress.  Illegal drugs such as marijuana, phencyclidine (PCP), and cocaine.  Only take medicine as directed by your caregiver.  Keep all follow-up appointments as directed by your caregiver. SEEK IMMEDIATE MEDICAL CARE IF:   You have pain in your chest, upper arms, jaw, or neck.  You become weak, dizzy, or feel faint.  You have palpitations that will not go away.  You vomit, have diarrhea, or pass blood in your stool.  Your skin is cool, pale, and wet.  You have a fever that will not go away with rest, fluids, and medicine. MAKE SURE YOU:    Understand these instructions.  Will watch your condition.  Will get help right away if you are not doing well or get worse. Document Released: 04/24/2004 Document Revised: 06/09/2011 Document Reviewed: 02/25/2011 [facility] Patient Information 2014 [place], [place].

## 2013-07-16 NOTE — ED Provider Notes (Signed)
Medical screening examination/treatment/procedure(s) were performed by non-physician practitioner and as supervising physician I was immediately available for consultation/collaboration.   EKG Interpretation   Date/Time:  Thursday July 07 2013 21:59:35 EDT Ventricular Rate:  135 PR Interval:  130 QRS Duration: 72 QT Interval:  321 QTC Calculation: 481 R Axis:   68 Text Interpretation:  Sinus tachycardia Borderline prolonged QT interval  Baseline wander in lead(s) I III aVL ED PHYSICIAN INTERPRETATION AVAILABLE  IN [facility] Confirmed by TEST, Record ([id number]) on 07/09/2013 10:06:02  AM       Varney Biles, MD 07/16/13 1523

## 2013-07-20 ENCOUNTER — Telehealth: Payer: Self-pay | Admitting: *Deleted

## 2013-07-20 ENCOUNTER — Other Ambulatory Visit: Payer: Self-pay | Admitting: Nurse Practitioner

## 2013-07-20 MED ORDER — DULOXETINE HCL 30 MG PO CPEP: 60.0000 mg | ORAL_CAPSULE | Freq: Two times a day (BID) | ORAL | Status: DC

## 2013-07-20 NOTE — Telephone Encounter (Signed)
Rx did sent successfully.  I called the patient.  She is aware.

## 2013-07-20 NOTE — Telephone Encounter (Signed)
Done, check behind me to make sure it went through. Thanks, LL

## 2013-07-20 NOTE — Telephone Encounter (Signed)
Patient calling requesting refill for DULoxetine (CYMBALTA) 30 MG capsule due to pain management increased to 2 pills a day instead of 1 pill as prescribed by Heide Guile.  Please call pt and advise.  thanks

## 2013-07-20 NOTE — Telephone Encounter (Signed)
Patient would like to get a new Rx for Cymbalta for 2 caps daily instead of 1.  Please advise.  Thank you.

## 2013-08-03 ENCOUNTER — Emergency Department (HOSPITAL_COMMUNITY)
Admission: EM | Admit: 2013-08-03 | Discharge: 2013-08-03 | Disposition: A | Discharge status: Home / Self Care | Payer: 59 | Attending: Emergency Medicine | Admitting: Emergency Medicine

## 2013-08-03 ENCOUNTER — Encounter (HOSPITAL_COMMUNITY): Payer: Self-pay | Admitting: Emergency Medicine

## 2013-08-03 DIAGNOSIS — M791 Myalgia, unspecified site: Secondary | ICD-10-CM

## 2013-08-03 DIAGNOSIS — Z794 Long term (current) use of insulin: Secondary | ICD-10-CM | POA: Insufficient documentation

## 2013-08-03 DIAGNOSIS — Z791 Long term (current) use of non-steroidal anti-inflammatories (NSAID): Secondary | ICD-10-CM | POA: Insufficient documentation

## 2013-08-03 DIAGNOSIS — R0789 Other chest pain: Secondary | ICD-10-CM | POA: Insufficient documentation

## 2013-08-03 DIAGNOSIS — Z8719 Personal history of other diseases of the digestive system: Secondary | ICD-10-CM | POA: Insufficient documentation

## 2013-08-03 DIAGNOSIS — E1010 Type 1 diabetes mellitus with ketoacidosis without coma: Secondary | ICD-10-CM | POA: Insufficient documentation

## 2013-08-03 DIAGNOSIS — I10 Essential (primary) hypertension: Secondary | ICD-10-CM | POA: Insufficient documentation

## 2013-08-03 DIAGNOSIS — Z79899 Other long term (current) drug therapy: Secondary | ICD-10-CM | POA: Insufficient documentation

## 2013-08-03 DIAGNOSIS — IMO0001 Reserved for inherently not codable concepts without codable children: Secondary | ICD-10-CM | POA: Insufficient documentation

## 2013-08-03 DIAGNOSIS — F411 Generalized anxiety disorder: Secondary | ICD-10-CM | POA: Insufficient documentation

## 2013-08-03 DIAGNOSIS — R000 Tachycardia, unspecified: Secondary | ICD-10-CM | POA: Insufficient documentation

## 2013-08-03 LAB — I-STAT CHEM 8, ED
BUN: 8 mg/dL (ref 6–23)
Calcium, Ion: 1.18 mmol/L (ref 1.12–1.23)
Chloride: 98 mEq/L (ref 96–112)
Creatinine, Ser: 0.5 mg/dL (ref 0.50–1.10)
Glucose, Bld: 198 mg/dL — ABNORMAL HIGH (ref 70–99)
HCT: 37 % (ref 36.0–46.0)
Hemoglobin: 12.6 g/dL (ref 12.0–15.0)
Potassium: 3.5 mEq/L — ABNORMAL LOW (ref 3.7–5.3)
Sodium: 138 mEq/L (ref 137–147)
TCO2: 24 mmol/L (ref 0–100)

## 2013-08-03 LAB — CBC WITH DIFFERENTIAL/PLATELET
Basophils Absolute: 0 10*3/uL (ref 0.0–0.1)
Basophils Relative: 0 % (ref 0–1)
Eosinophils Absolute: 0.1 10*3/uL (ref 0.0–0.7)
Eosinophils Relative: 2 % (ref 0–5)
HCT: 35.3 % — ABNORMAL LOW (ref 36.0–46.0)
Hemoglobin: 12.3 g/dL (ref 12.0–15.0)
Lymphocytes Relative: 48 % — ABNORMAL HIGH (ref 12–46)
Lymphs Abs: 3.1 10*3/uL (ref 0.7–4.0)
MCH: 30.4 pg (ref 26.0–34.0)
MCHC: 34.8 g/dL (ref 30.0–36.0)
MCV: 87.2 fL (ref 78.0–100.0)
Monocytes Absolute: 0.5 10*3/uL (ref 0.1–1.0)
Monocytes Relative: 7 % (ref 3–12)
Neutro Abs: 2.9 10*3/uL (ref 1.7–7.7)
Neutrophils Relative %: 44 % (ref 43–77)
Platelets: 221 10*3/uL (ref 150–400)
RBC: 4.05 MIL/uL (ref 3.87–5.11)
RDW: 11.3 % — ABNORMAL LOW (ref 11.5–15.5)
WBC: 6.5 10*3/uL (ref 4.0–10.5)

## 2013-08-03 MED ORDER — SODIUM CHLORIDE 0.9 % IV BOLUS (SEPSIS)
1000.0000 mL | Freq: Once | INTRAVENOUS | Status: AC
  Administered 2013-08-03: 1000 mL via INTRAVENOUS

## 2013-08-03 MED ORDER — KETOROLAC TROMETHAMINE 30 MG/ML IJ SOLN
30.0000 mg | Freq: Once | INTRAMUSCULAR | Status: AC
  Administered 2013-08-03: 30 mg via INTRAVENOUS
  Filled 2013-08-03: qty 1

## 2013-08-03 NOTE — ED Provider Notes (Signed)
CSN: [id number]     Arrival date & time 08/03/13  0056 History   First MD Initiated Contact with Patient 08/03/13 0146     Chief Complaint  Patient presents with  . Chest Pain  . Generalized Body Aches     (Consider location/radiation/quality/duration/timing/severity/associated sxs/prior Treatment) Patient is a 23 y.o. female presenting with chest pain. The history is provided by the patient.  Chest Pain Pain location:  Unable to specify Pain quality: tightness   Pain radiates to:  Does not radiate Pain radiates to the back: no   Pain severity:  Mild Onset quality:  Gradual Duration:  3 hours Timing:  Constant Progression:  Improving Chronicity:  Recurrent Context: at rest   Context: not breathing, no drug use, not eating, no intercourse, not lifting, no movement, not raising an arm, no stress and no trauma   Relieved by:  None tried Worsened by:  Nothing tried Associated symptoms: no abdominal pain, no cough, no fever, no nausea, no near-syncope, no shortness of breath, not vomiting and no weakness   Associated symptoms comment:  Myalgia Risk factors: diabetes mellitus     Past Medical History  Diagnosis Date  . Diabetes mellitus   . Hypertension   . Diabetes type 1, uncontrolled 11/14/2011    Since age 37  . Gastroparesis    Past Surgical History  Procedure Laterality Date  . Ankle surgery    . Cholecystectomy  11/15/2011    Procedure: LAPAROSCOPIC CHOLECYSTECTOMY WITH INTRAOPERATIVE CHOLANGIOGRAM;  Surgeon: [name], MD;  Location: [facility];  Service: General;  Laterality: N/A;  . Laparoscopy  11/23/2011    Procedure: LAPAROSCOPY DIAGNOSTIC;  Surgeon: Mariella Saa, MD;  Location: [facility];  Service: General;  Laterality: N/A;  . Esophagogastroduodenoscopy  12/03/2011    Procedure: ESOPHAGOGASTRODUODENOSCOPY (EGD);  Surgeon: Theda Belfast, MD;  Location: [facility];  Service: Endoscopy;  Laterality: N/A;   Family History  Problem Relation Age of Onset  .  Diabetes Mother   . Hypertension Father    History  Substance Use Topics  . Smoking status: Never Smoker   . Smokeless tobacco: Never Used  . Alcohol Use: Yes     Comment: Occasional   OB History   Grav Para Term Preterm Abortions TAB SAB Ect Mult Living                 Review of Systems  Constitutional: Negative for fever and chills.  Respiratory: Negative for cough and shortness of breath.   Cardiovascular: Positive for chest pain. Negative for leg swelling and near-syncope.  Gastrointestinal: Negative for nausea, vomiting and abdominal pain.  Musculoskeletal: Positive for myalgias.  Skin: Negative for rash and wound.  Neurological: Negative for weakness.  Psychiatric/Behavioral: The patient is nervous/anxious.   All other systems reviewed and are negative.     Allergies  Peanut-containing drug products  Home Medications   Prior to Admission medications   Medication Sig Start Date End Date Taking? Authorizing Provider  cyclobenzaprine (FLEXERIL) 10 MG tablet Take 10 mg by mouth 3 (three) times daily as needed for muscle spasms. 05/21/13   Ward Givens, MD  DULoxetine (CYMBALTA) 30 MG capsule Take 2 capsules (60 mg total) by mouth 2 (two) times daily. 07/20/13   Ronal Fear, NP  gabapentin (NEURONTIN) 400 MG capsule Take 400 mg by mouth 4 (four) times daily.    Historical Provider, MD  insulin glargine (LANTUS) 100 UNIT/ML injection Inject 40 Units into the skin at bedtime.  Historical Provider, MD  insulin lispro (HUMALOG) 100 UNIT/ML injection Inject 5-15 Units into the skin 3 (three) times daily with meals. Inject 5 units (base) and additional units according to sliding scale- sliding scale: CBG 70 - 120: 0 units: CBG 121 - 150: 2 units; CBG 151 - 200: 3 units; CBG 201 - 250: 5 units; CBG 251 - 300: 8 units; CBG 301 - 350: 11 units; CBG 351 - 400: 15 units; CBG > 400 Call MD 06/12/13   Leroy Sea, MD  magnesium oxide (MAG-OX) 400 MG tablet Take 400 mg by mouth at  bedtime.    Historical Provider, MD  metoCLOPramide (REGLAN) 10 MG tablet Take 10 mg by mouth 3 (three) times daily as needed for nausea. Takes with each meal only as needed for nausea. 03/16/12   Cristal Ford, MD  naproxen (NAPROSYN) 500 MG tablet Take 1 tablet (500 mg total) by mouth 2 (two) times daily with a meal. 06/16/13   Vida Roller, MD  naproxen (NAPROSYN) 500 MG tablet Take 1 tablet (500 mg total) by mouth 2 (two) times daily. 07/13/13   Kristen N Ward, DO  oxyCODONE-acetaminophen (PERCOCET/ROXICET) 5-325 MG per tablet Take 2 tablets by mouth every 4 (four) hours as needed for severe pain.    Historical Provider, MD  promethazine (PHENERGAN) 25 MG tablet Take 25 mg by mouth every 6 (six) hours as needed for nausea or vomiting. For nausea    Historical Provider, MD   BP 129/82  Pulse 117  Temp(Src) 98.4 F (36.9 C) (Oral)  Resp 18  SpO2 100%  LMP 07/12/2013 Physical Exam  Nursing note and vitals reviewed. Constitutional: She is oriented to person, place, and time. She appears well-developed and well-nourished.  HENT:  Head: Normocephalic.  Mouth/Throat: Oropharynx is clear and moist.  Eyes: Pupils are equal, round, and reactive to light.  Neck: Normal range of motion.  Cardiovascular: Regular rhythm.  Tachycardia present.   Pulmonary/Chest: Effort normal. She has no wheezes.  Abdominal: Soft. She exhibits no distension. There is no tenderness.  Musculoskeletal: Normal range of motion. She exhibits no edema and no tenderness.  Lymphadenopathy:    She has no cervical adenopathy.  Neurological: She is alert and oriented to person, place, and time.  Skin: Skin is warm. No rash noted. No erythema.  Psychiatric: Her behavior is normal.    ED Course  Procedures (including critical care time) Labs Review Labs Reviewed  CBC WITH DIFFERENTIAL - Abnormal; Notable for the following:    HCT 35.3 (*)    RDW 11.3 (*)    Lymphocytes Relative 48 (*)    All other components within  normal limits  I-STAT CHEM 8, ED - Abnormal; Notable for the following:    Potassium 3.5 (*)    Glucose, Bld 198 (*)    All other components within normal limits    Imaging Review No results found.   EKG Interpretation None      MDM  Labs within normal limits with BS sightly elevated at 198 but this is good for this particular patinet Her chest discomfort improved with Toradol and she has been walking in the ED. States feels much better and would like to go home  Final diagnoses:  Myalgia  Tachycardia  DKA, type 1        Arman Filter, NP 08/03/13 [phone]

## 2013-08-03 NOTE — ED Notes (Signed)
Pt states she has had body aches all day on Tuesday and started having chest tightness about 3 hrs ago

## 2013-08-03 NOTE — ED Provider Notes (Signed)
Medical screening examination/treatment/procedure(s) were performed by non-physician practitioner and as supervising physician I was immediately available for consultation/collaboration.   EKG Interpretation None       Olivia Mackie, MD 08/03/13 [phone]

## 2013-08-03 NOTE — Discharge Instructions (Signed)
Chest Wall Pain Chest wall pain is pain in or around the bones and muscles of your chest. It may take up to 6 weeks to get better. It may take longer if you must stay physically active in your work and activities.  CAUSES  Chest wall pain may happen on its own. However, it may be caused by:  A viral illness like the flu.  Injury.  Coughing.  Exercise.  Arthritis.  Fibromyalgia.  Shingles. HOME CARE INSTRUCTIONS   Avoid overtiring physical activity. Try not to strain or perform activities that cause pain. This includes any activities using your chest or your abdominal and side muscles, especially if heavy weights are used.  Put ice on the sore area.  Put ice in a plastic bag.  Place a towel between your skin and the bag.  Leave the ice on for 15-20 minutes per hour while awake for the first 2 days.  Only take over-the-counter or prescription medicines for pain, discomfort, or fever as directed by your caregiver. SEEK IMMEDIATE MEDICAL CARE IF:   Your pain increases, or you are very uncomfortable.  You have a fever.  Your chest pain becomes worse.  You have new, unexplained symptoms.  You have nausea or vomiting.  You feel sweaty or lightheaded.  You have a cough with phlegm (sputum), or you cough up blood. MAKE SURE YOU:   Understand these instructions.  Will watch your condition.  Will get help right away if you are not doing well or get worse. Document Released: 03/17/2005 Document Revised: 06/09/2011 Document Reviewed: 11/11/2010 [facility] Patient Information 2014 [place], [place]. Today your labs are normal your blood sugars slightly high at 198

## 2013-09-08 ENCOUNTER — Ambulatory Visit: Payer: 59 | Admitting: Internal Medicine

## 2013-09-09 ENCOUNTER — Encounter: Payer: Self-pay | Admitting: Internal Medicine

## 2013-10-03 ENCOUNTER — Encounter: Payer: 59 | Attending: Internal Medicine | Admitting: *Deleted

## 2013-10-03 ENCOUNTER — Encounter: Payer: Self-pay | Admitting: *Deleted

## 2013-10-03 VITALS — Ht 67.0 in | Wt 149.2 lb

## 2013-10-03 DIAGNOSIS — E1065 Type 1 diabetes mellitus with hyperglycemia: Secondary | ICD-10-CM | POA: Diagnosis not present

## 2013-10-03 DIAGNOSIS — Z713 Dietary counseling and surveillance: Secondary | ICD-10-CM | POA: Diagnosis not present

## 2013-10-03 DIAGNOSIS — IMO0002 Reserved for concepts with insufficient information to code with codable children: Secondary | ICD-10-CM | POA: Insufficient documentation

## 2013-10-03 DIAGNOSIS — Z794 Long term (current) use of insulin: Secondary | ICD-10-CM | POA: Diagnosis not present

## 2013-10-03 NOTE — Patient Instructions (Signed)
Plan: Follow instructions of what to bring for pump start Consider getting Rx from Dr. Sharl Ma for [employer] Keto-Stix Consider getting a pkg. of syringes for back up Practice your carb counting by food group Continue reading Food Labels too.

## 2013-10-03 NOTE — Progress Notes (Signed)
Introduction to Insulin Pump Therapy:  Appt start time: 0730 end time:  0830.  Assessment:  This patient has DM 1 and their primary concerns today: wants to start on T-Slim insulin pump which she has already received.  This patient is interested in learning more about insulin pump therapy because she wants better control  MEDICATIONS: Basal Insulin: 30 units of Lantus at 15 in AM and 15 in PM via pen Bolus Insulin: 5 units of Humalot at each meal plus a Sliding Scale for BG above 150 mg/dl  via pen Total of insulin doses per day 5 injections  Other diabetes medications: none  Patient does not currently have Ketone Strips  This patient is  currently adjusting bolus insulin based BG at a correction ratio of 2 units/50 mg/dl above Target of 154 mg/dl This patient is not currently adjusting bolus insulin based on carb intake. She takes set dose of 5 units per meal Patient states knowledge of Carb Counting is fair  Usual physical activity: bikes, walks and water exercises  Last A1c was not provided Patient states complications from diabetes include gastroparesis in the past Patient states they forget to take their insulin injection on average 1-2 times per week Patient states they have had hypoglycemia 6-8 times in the past month Patient states their biggest barrier with diabetes is expense of insulin  Patient currently is not working right now   Progress Towards Obtaining an Insulin PumpGoal(s):  In progress.  Patient states their expectations of pump therapy include: better control and fewer injections Patient expresses understanding that for improved outcomes for their diabetes on an insulin pump they will:  Check BG 4-6 times per day  Change out pump infusion set at least every 3 days  Upload pump information to software on a regular basis so provider can assess patterns and make setting adjustments.  Get Rx for [employer] Keto-Stix that she can carry with her in meter case and  be able to check ketones as needed especially when on inulin pump.     Intervention:    Taught difference between delivery of insulin via syringe/pen compared to insulin pump.  Demonstrated improved insulin delivery via pump due to improved accuracy of dose and flexibility of adjusting bolus insulin based on carb intake and BG correction.  Explained importance of testing BG at least 4 times per day for appropriate correction of high BG and prevention of DKA as applicable.  Emphasized importance of follow up after Pump Start for appropriate pump setting adjustments and on-going training on more advanced features.  Suggested she ask MD about potential use of Regular insulin in pump IF she cannot afford Humalog in the future.  Reviewed basic carb counting with use of food groups to make decisions with Food Labels are not available  Handouts given during visit include:  Intro to Pumping handout   Insulin action handout   Carb Counting handout  Instructions for insulin dosing on pump start day  Plan: Follow instructions of what to bring for pump start Consider getting Rx from Dr. Sharl Ma for [employer] Keto-Stix Consider getting a pkg. of syringes for back up Practice your carb counting by food group Continue reading Food Labels too.    Follow up: plan pump start training on 10/14/13.  She has instructions of what to bring and how to take Lantus prior to pump start

## 2013-10-05 ENCOUNTER — Emergency Department (HOSPITAL_COMMUNITY): Payer: 59

## 2013-10-05 ENCOUNTER — Emergency Department (HOSPITAL_COMMUNITY)
Admission: EM | Admit: 2013-10-05 | Discharge: 2013-10-05 | Disposition: A | Discharge status: Home / Self Care | Payer: 59 | Attending: Emergency Medicine | Admitting: Emergency Medicine

## 2013-10-05 ENCOUNTER — Encounter (HOSPITAL_COMMUNITY): Payer: Self-pay | Admitting: Emergency Medicine

## 2013-10-05 DIAGNOSIS — IMO0002 Reserved for concepts with insufficient information to code with codable children: Secondary | ICD-10-CM | POA: Insufficient documentation

## 2013-10-05 DIAGNOSIS — M791 Myalgia, unspecified site: Secondary | ICD-10-CM

## 2013-10-05 DIAGNOSIS — Z79899 Other long term (current) drug therapy: Secondary | ICD-10-CM | POA: Insufficient documentation

## 2013-10-05 DIAGNOSIS — I10 Essential (primary) hypertension: Secondary | ICD-10-CM | POA: Insufficient documentation

## 2013-10-05 DIAGNOSIS — M7989 Other specified soft tissue disorders: Secondary | ICD-10-CM | POA: Insufficient documentation

## 2013-10-05 DIAGNOSIS — E1065 Type 1 diabetes mellitus with hyperglycemia: Secondary | ICD-10-CM | POA: Insufficient documentation

## 2013-10-05 DIAGNOSIS — Z794 Long term (current) use of insulin: Secondary | ICD-10-CM | POA: Insufficient documentation

## 2013-10-05 DIAGNOSIS — R5381 Other malaise: Secondary | ICD-10-CM | POA: Insufficient documentation

## 2013-10-05 DIAGNOSIS — R05 Cough: Secondary | ICD-10-CM | POA: Insufficient documentation

## 2013-10-05 DIAGNOSIS — Z791 Long term (current) use of non-steroidal anti-inflammatories (NSAID): Secondary | ICD-10-CM | POA: Insufficient documentation

## 2013-10-05 DIAGNOSIS — R079 Chest pain, unspecified: Secondary | ICD-10-CM | POA: Insufficient documentation

## 2013-10-05 DIAGNOSIS — K3184 Gastroparesis: Secondary | ICD-10-CM | POA: Insufficient documentation

## 2013-10-05 DIAGNOSIS — R5383 Other fatigue: Secondary | ICD-10-CM | POA: Insufficient documentation

## 2013-10-05 DIAGNOSIS — R059 Cough, unspecified: Secondary | ICD-10-CM | POA: Insufficient documentation

## 2013-10-05 DIAGNOSIS — R52 Pain, unspecified: Secondary | ICD-10-CM | POA: Insufficient documentation

## 2013-10-05 LAB — BASIC METABOLIC PANEL
Anion gap: 11 (ref 5–15)
BUN: 14 mg/dL (ref 6–23)
CO2: 27 mEq/L (ref 19–32)
Calcium: 9.2 mg/dL (ref 8.4–10.5)
Chloride: 99 mEq/L (ref 96–112)
Creatinine, Ser: 0.43 mg/dL — ABNORMAL LOW (ref 0.50–1.10)
GFR calc Af Amer: >90 mL/min (ref >90)
GFR calc non Af Amer: >90 mL/min (ref >90)
Glucose, Bld: 121 mg/dL — ABNORMAL HIGH (ref 70–99)
Potassium: 4.3 mEq/L (ref 3.7–5.3)
Sodium: 137 mEq/L (ref 137–147)

## 2013-10-05 LAB — CBC WITH DIFFERENTIAL/PLATELET
Basophils Absolute: 0 10*3/uL (ref 0.0–0.1)
Basophils Relative: 1 % (ref 0–1)
Eosinophils Absolute: 0.2 10*3/uL (ref 0.0–0.7)
Eosinophils Relative: 3 % (ref 0–5)
HCT: 33.5 % — ABNORMAL LOW (ref 36.0–46.0)
Hemoglobin: 11.6 g/dL — ABNORMAL LOW (ref 12.0–15.0)
Lymphocytes Relative: 52 % — ABNORMAL HIGH (ref 12–46)
Lymphs Abs: 3.4 10*3/uL (ref 0.7–4.0)
MCH: 31.1 pg (ref 26.0–34.0)
MCHC: 34.6 g/dL (ref 30.0–36.0)
MCV: 89.8 fL (ref 78.0–100.0)
Monocytes Absolute: 0.5 10*3/uL (ref 0.1–1.0)
Monocytes Relative: 8 % (ref 3–12)
Neutro Abs: 2.3 10*3/uL (ref 1.7–7.7)
Neutrophils Relative %: 36 % — ABNORMAL LOW (ref 43–77)
Platelets: 232 10*3/uL (ref 150–400)
RBC: 3.73 MIL/uL — ABNORMAL LOW (ref 3.87–5.11)
RDW: 12.2 % (ref 11.5–15.5)
WBC: 6.5 10*3/uL (ref 4.0–10.5)

## 2013-10-05 LAB — I-STAT TROPONIN, ED: Troponin i, poc: 0.01 ng/mL (ref 0.00–0.08)

## 2013-10-05 LAB — I-STAT CG4 LACTIC ACID, ED: Lactic Acid, Venous: 1.56 mmol/L (ref 0.5–2.2)

## 2013-10-05 IMAGING — CR DG CHEST 2V
2 series · 2 of 2 positions shown · non-contrast
Comparison: [DATE]

CLINICAL DATA: Body aches. Low grade fever. Chest pain. Diabetes.

EXAM:
CHEST  2 VIEW

[w chest pa]
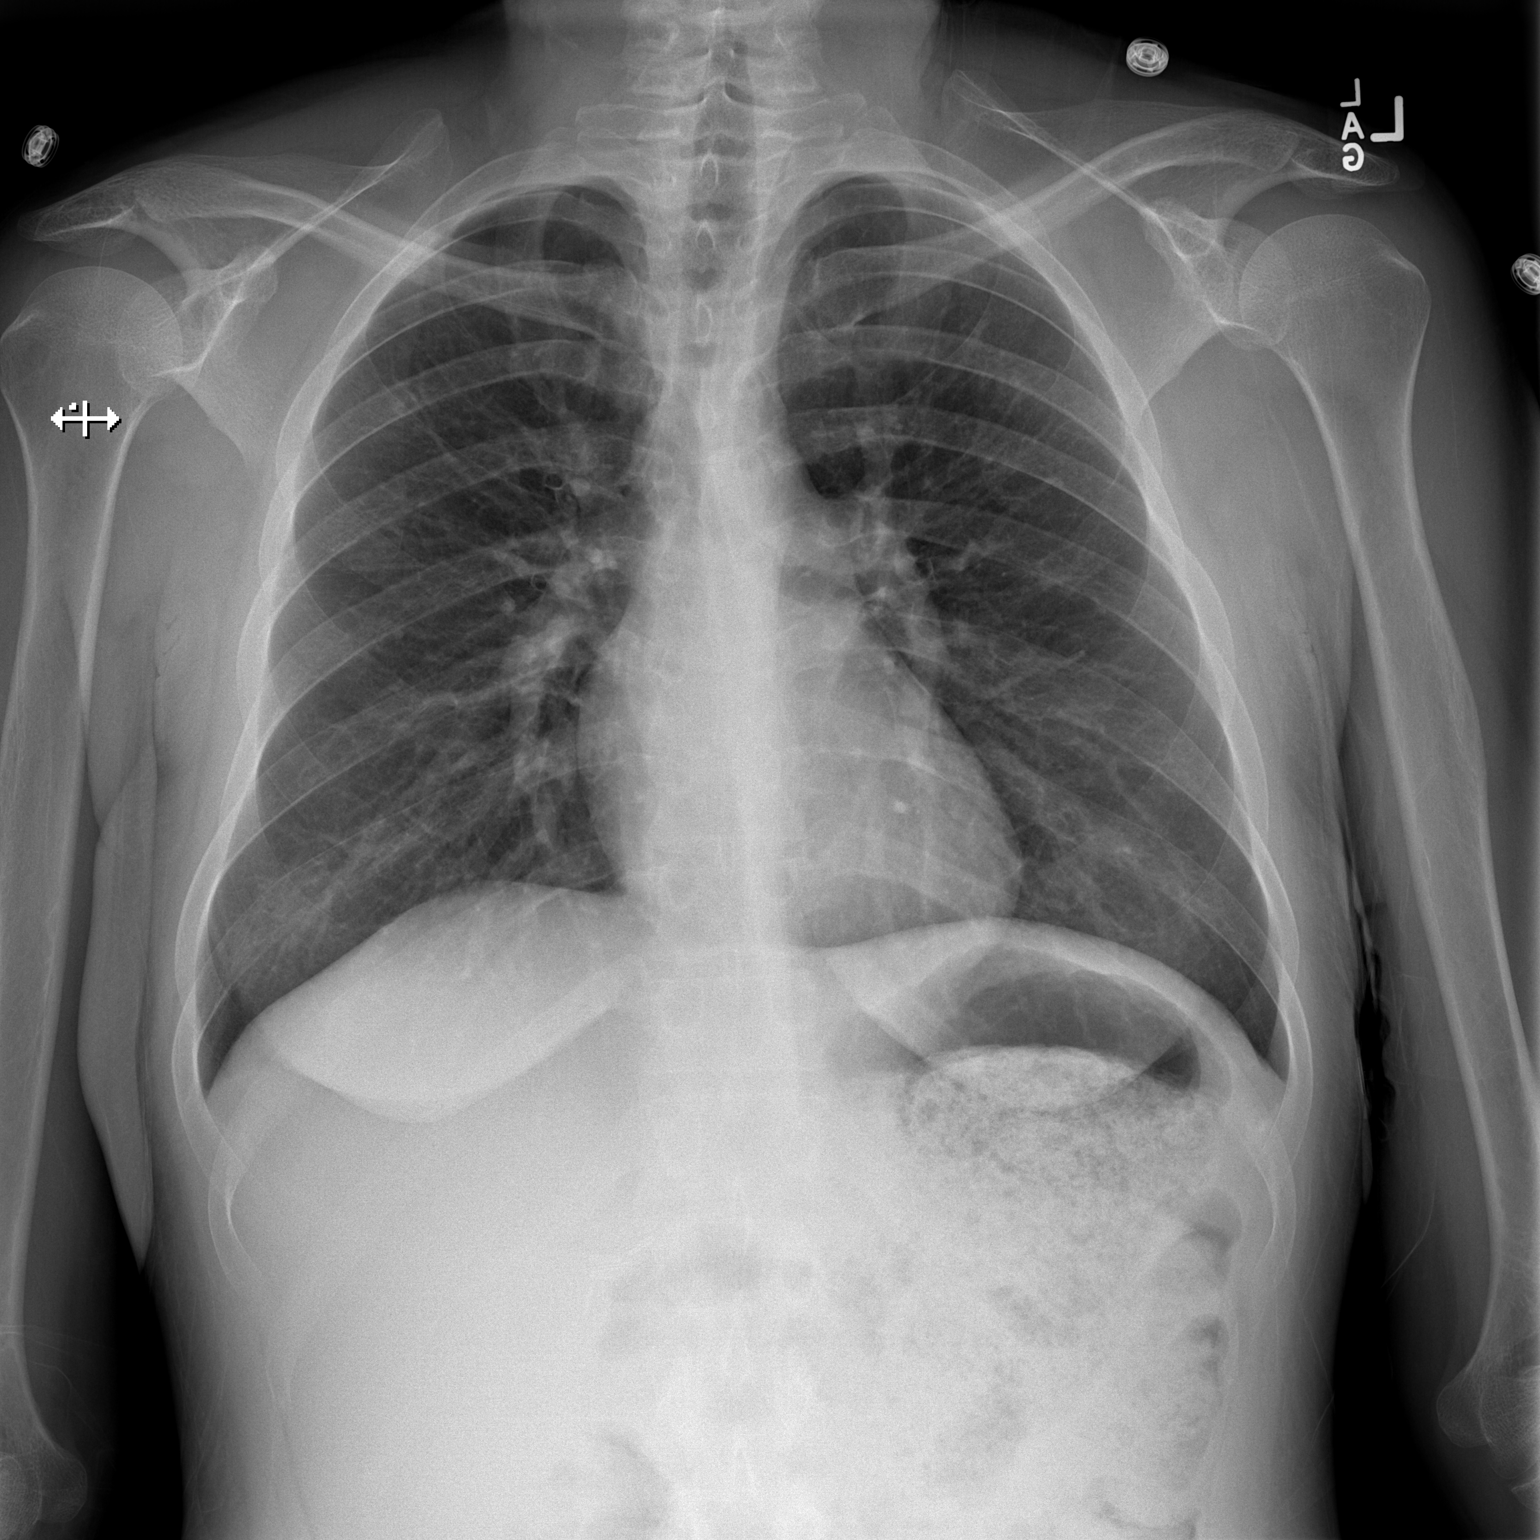

[w chest lat]
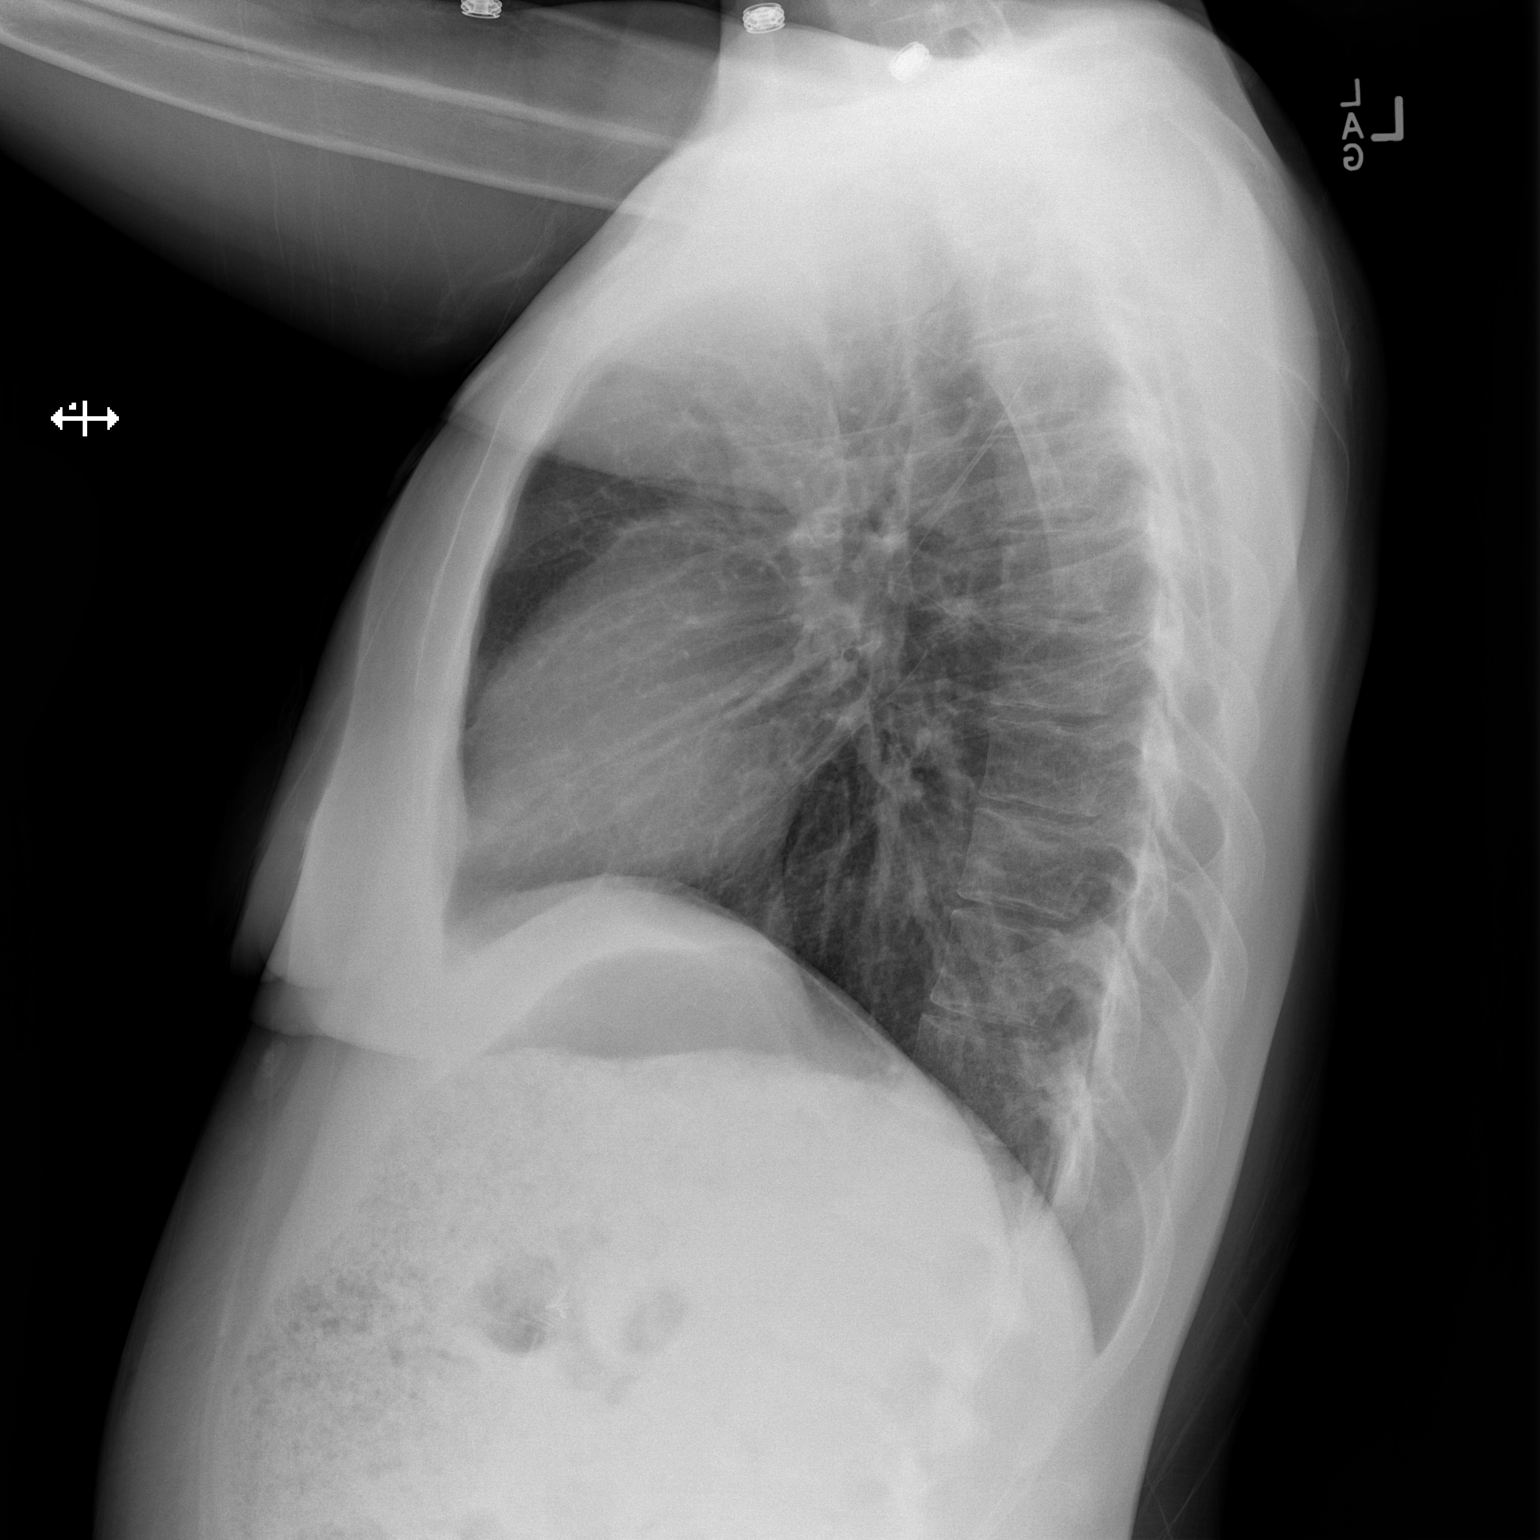

[2 of 2 positions shown; findings below may reference images not displayed]

FINDINGS: The heart size and mediastinal contours are within normal limits.
Both lungs are clear. The visualized skeletal structures are
unremarkable.
IMPRESSION: No active cardiopulmonary disease.

## 2013-10-05 MED ORDER — SODIUM CHLORIDE 0.9 % IV BOLUS (SEPSIS)
1000.0000 mL | Freq: Once | INTRAVENOUS | Status: AC
  Administered 2013-10-05: 1000 mL via INTRAVENOUS

## 2013-10-05 MED ORDER — KETOROLAC TROMETHAMINE 30 MG/ML IJ SOLN
30.0000 mg | Freq: Once | INTRAMUSCULAR | Status: AC
  Administered 2013-10-05: 30 mg via INTRAVENOUS
  Filled 2013-10-05: qty 1

## 2013-10-05 MED ORDER — MORPHINE SULFATE 4 MG/ML IJ SOLN
4.0000 mg | INTRAMUSCULAR | Status: DC | PRN
  Administered 2013-10-05: 4 mg via INTRAVENOUS
  Filled 2013-10-05: qty 1

## 2013-10-05 MED ORDER — ONDANSETRON HCL 4 MG/2ML IJ SOLN
4.0000 mg | Freq: Once | INTRAMUSCULAR | Status: AC
  Administered 2013-10-05: 4 mg via INTRAVENOUS
  Filled 2013-10-05: qty 2

## 2013-10-05 MED ORDER — SODIUM CHLORIDE 0.9 % IV SOLN
Freq: Once | INTRAVENOUS | Status: DC
Start: 2013-10-05 — End: 2013-10-05

## 2013-10-05 MED ORDER — NAPROXEN 500 MG PO TABS: 500.0000 mg | ORAL_TABLET | Freq: Two times a day (BID) | ORAL | Status: DC

## 2013-10-05 NOTE — ED Provider Notes (Signed)
CSN: [id number]     Arrival date & time 10/05/13  0020 History   First MD Initiated Contact with Patient 10/05/13 0131     Chief Complaint  Patient presents with  . Generalized Body Aches  . Chest Pain      HPI  Patient presents with a three-day illness. Started with a dry cough. Coughing point now she has pain in her central chest. Mild diffuse bodyaches. No fever. Hasn't history of insulin-dependent diabetes type 1 x10 years. History of fibromyalgia which is treated with when necessary Percocet and physical therapy. Has not had to take her medicines for her muscle pain for over a month. History tachycardia. Seen by cardiology. She states "they told it was nothing to worry about".  Past Medical History  Diagnosis Date  . Diabetes mellitus   . Hypertension   . Diabetes type 1, uncontrolled 11/14/2011    Since age 64  . Gastroparesis    Past Surgical History  Procedure Laterality Date  . Ankle surgery    . Cholecystectomy  11/15/2011    Procedure: LAPAROSCOPIC CHOLECYSTECTOMY WITH INTRAOPERATIVE CHOLANGIOGRAM;  Surgeon: [name], MD;  Location: [facility];  Service: General;  Laterality: N/A;  . Laparoscopy  11/23/2011    Procedure: LAPAROSCOPY DIAGNOSTIC;  Surgeon: Mariella Saa, MD;  Location: [facility];  Service: General;  Laterality: N/A;  . Esophagogastroduodenoscopy  12/03/2011    Procedure: ESOPHAGOGASTRODUODENOSCOPY (EGD);  Surgeon: Theda Belfast, MD;  Location: [facility];  Service: Endoscopy;  Laterality: N/A;   Family History  Problem Relation Age of Onset  . Diabetes Mother   . Hypertension Father    History  Substance Use Topics  . Smoking status: Never Smoker   . Smokeless tobacco: Never Used  . Alcohol Use: Yes     Comment: Occasional, once a month mixed drink   OB History   Grav Para Term Preterm Abortions TAB SAB Ect Mult Living                 Review of Systems  Constitutional: Positive for fatigue. Negative for fever, chills, diaphoresis and  appetite change.  HENT: Negative for mouth sores, sore throat and trouble swallowing.   Eyes: Negative for visual disturbance.  Respiratory: Negative for cough, chest tightness, shortness of breath and wheezing.   Cardiovascular: Positive for chest pain and leg swelling.  Gastrointestinal: Negative for nausea, vomiting, abdominal pain, diarrhea and abdominal distention.  Endocrine: Negative for polydipsia, polyphagia and polyuria.  Genitourinary: Negative for dysuria, frequency and hematuria.  Musculoskeletal: Positive for myalgias. Negative for gait problem.  Skin: Negative for color change, pallor and rash.  Neurological: Negative for dizziness, syncope, light-headedness and headaches.  Hematological: Does not bruise/bleed easily.  Psychiatric/Behavioral: Negative for behavioral problems and confusion.      Allergies  Peanut-containing drug products  Home Medications   Prior to Admission medications   Medication Sig Start Date End Date Taking? Authorizing Provider  DULoxetine (CYMBALTA) 30 MG capsule Take 2 capsules (60 mg total) by mouth 2 (two) times daily. 07/20/13  Yes Ronal Fear, NP  gabapentin (NEURONTIN) 400 MG capsule Take 400 mg by mouth 4 (four) times daily.   Yes Historical Provider, MD  insulin glargine (LANTUS) 100 UNIT/ML injection Inject 15 Units into the skin 2 (two) times daily.    Yes Historical Provider, MD  insulin lispro (HUMALOG) 100 UNIT/ML injection Inject 5-15 Units into the skin 3 (three) times daily with meals. Inject 5 units (base) and additional units  according to sliding scale- sliding scale: CBG 70 - 120: 0 units: CBG 121 - 150: 2 units; CBG 151 - 200: 3 units; CBG 201 - 250: 5 units; CBG 251 - 300: 8 units; CBG 301 - 350: 11 units; CBG 351 - 400: 15 units; CBG > 400 Call MD 06/12/13  Yes Leroy Sea, MD  magnesium oxide (MAG-OX) 400 MG tablet Take 400 mg by mouth at bedtime.   Yes Historical Provider, MD  metoCLOPramide (REGLAN) 10 MG tablet Take 10  mg by mouth 3 (three) times daily as needed for nausea. Takes with each meal only as needed for nausea. 03/16/12  Yes Cristal Ford, MD  naproxen (NAPROSYN) 500 MG tablet Take 1 tablet (500 mg total) by mouth 2 (two) times daily with a meal. 06/16/13  Yes Vida Roller, MD  oxyCODONE-acetaminophen (PERCOCET/ROXICET) 5-325 MG per tablet Take 2 tablets by mouth every 4 (four) hours as needed for severe pain.   Yes Historical Provider, MD  promethazine (PHENERGAN) 25 MG tablet Take 25 mg by mouth every 6 (six) hours as needed for nausea or vomiting. For nausea   Yes Historical Provider, MD  naproxen (NAPROSYN) 500 MG tablet Take 1 tablet (500 mg total) by mouth 2 (two) times daily. 10/05/13   Rolland Porter, MD   BP 107/62  Pulse 125  Temp(Src) 98.4 F (36.9 C) (Oral)  Resp 19  Ht 5\' 7"  (1.702 m)  Wt 150 lb (68.04 kg)  BMI 23.49 kg/m2  SpO2 100%  LMP 09/12/2013 Physical Exam  Constitutional: She is oriented to person, place, and time. She appears well-developed and well-nourished. No distress.  HENT:  Head: Normocephalic.  Eyes: Conjunctivae are normal. Pupils are equal, round, and reactive to light. No scleral icterus.  Neck: Normal range of motion. Neck supple. No thyromegaly present.  Cardiovascular: Normal rate and regular rhythm.  Exam reveals no gallop and no friction rub.   No murmur heard. Pulmonary/Chest: Effort normal and breath sounds normal. No respiratory distress. She has no wheezes. She has no rales.    Lungs clear bilaterally. No wheezing or prolongation.  Abdominal: Soft. Bowel sounds are normal. She exhibits no distension. There is no tenderness. There is no rebound.  Musculoskeletal: Normal range of motion.  Neurological: She is alert and oriented to person, place, and time.  Skin: Skin is warm and dry. No rash noted.  Psychiatric: She has a normal mood and affect. Her behavior is normal.    ED Course  Procedures (including critical care time) Labs Review Labs  Reviewed  CBC WITH DIFFERENTIAL - Abnormal; Notable for the following:    RBC 3.73 (*)    Hemoglobin 11.6 (*)    HCT 33.5 (*)    Neutrophils Relative % 36 (*)    Lymphocytes Relative 52 (*)    All other components within normal limits  BASIC METABOLIC PANEL - Abnormal; Notable for the following:    Glucose, Bld 121 (*)    Creatinine, Ser 0.43 (*)    All other components within normal limits  I-STAT CG4 LACTIC ACID, ED  Rosezena Sensor, ED    Imaging Review Dg Chest 2 View  10/05/2013   CLINICAL DATA:  Body aches. Low grade fever. Chest pain. Diabetes.  EXAM: CHEST  2 VIEW  COMPARISON:  06/10/2013  FINDINGS: The heart size and mediastinal contours are within normal limits. Both lungs are clear. The visualized skeletal structures are unremarkable.  IMPRESSION: No active cardiopulmonary disease.   Electronically Signed  By: Burman NievesWilliam  Stevens M.D.   On: 10/05/2013 03:12     EKG Interpretation None      MDM   Final diagnoses:  Chest pain, unspecified chest pain type  Cough  Muscle ache    Patient getting some symptom relief. Heart rate is improved to 105. Labs reassuring. Not acidotic. She is appropriate for outpatient treatment. Plan will be simple anti-inflammatory rest and hydration.    Rolland PorterMark Benno Brensinger, MD 10/05/13 0430

## 2013-10-05 NOTE — Discharge Instructions (Signed)
Chest Pain (Nonspecific) It is often hard to give a diagnosis for the cause of chest pain. There is always a chance that your pain could be related to something serious, such as a heart attack or a blood clot in the lungs. You need to follow up with your doctor. HOME CARE  If antibiotic medicine was given, take it as directed by your doctor. Finish the medicine even if you start to feel better.  For the next few days, avoid activities that bring on chest pain. Continue physical activities as told by your doctor.  Do not use any tobacco products. This includes cigarettes, chewing tobacco, and e-cigarettes.  Avoid drinking alcohol.  Only take medicine as told by your doctor.  Follow your doctor's suggestions for more testing if your chest pain does not go away.  Keep all doctor visits you made. GET HELP IF:  Your chest pain does not go away, even after treatment.  You have a rash with blisters on your chest.  You have a fever. GET HELP RIGHT AWAY IF:   You have more pain or pain that spreads to your arm, neck, jaw, back, or belly (abdomen).  You have shortness of breath.  You cough more than usual or cough up blood.  You have very bad back or belly pain.  You feel sick to your stomach (nauseous) or throw up (vomit).  You have very bad weakness.  You pass out (faint).  You have chills. This is an emergency. Do not wait to see if the problems will go away. Call your local emergency services (911 in [place] Do not drive yourself to the hospital. MAKE SURE YOU:   Understand these instructions.  Will watch your condition.  Will get help right away if you are not doing well or get worse. Document Released: 09/03/2007 Document Revised: 03/22/2013 Document Reviewed: 09/03/2007 [facility] Patient Information 2015 [name], [place]. This information is not intended to replace advice given to you by your health care provider. Make sure you discuss any questions you have with your  health care provider.  Cough, Adult  A cough is a reflex. It helps you clear your throat and airways. A cough can help heal your body. A cough can last 2 or 3 weeks (acute) or may last more than 8 weeks (chronic). Some common causes of a cough can include an infection, allergy, or a cold. HOME CARE  Only take medicine as told by your doctor.  If given, take your medicines (antibiotics) as told. Finish them even if you start to feel better.  Use a cold steam vaporizer or humidier in your home. This can help loosen thick spit (secretions).  Sleep so you are almost sitting up (semi-upright). Use pillows to do this. This helps reduce coughing.  Rest as needed.  Stop smoking if you smoke. GET HELP RIGHT AWAY IF:  You have yellowish-white fluid (pus) in your thick spit.  Your cough gets worse.  Your medicine does not reduce coughing, and you are losing sleep.  You cough up blood.  You have trouble breathing.  Your pain gets worse and medicine does not help.  You have a fever. MAKE SURE YOU:   Understand these instructions.  Will watch your condition.  Will get help right away if you are not doing well or get worse. Document Released: 11/28/2010 Document Revised: 06/09/2011 Document Reviewed: 11/28/2010 [facility] Patient Information 2015 [place], [place]. This information is not intended to replace advice given to you by your health care  provider. Make sure you discuss any questions you have with your health care provider.  Muscle Pain Muscle pain (myalgia) may be caused by many things, including:  Overuse or muscle strain, especially if you are not in shape. This is the most common cause of muscle pain.  Injury.  Bruises.  Viruses, such as the flu.  Infectious diseases.  Fibromyalgia, which is a chronic condition that causes muscle tenderness, fatigue, and headache.  Autoimmune diseases, including lupus.  Certain drugs, including ACE inhibitors and statins. Muscle  pain may be mild or severe. In most cases, the pain lasts only a short time and goes away without treatment. To diagnose the cause of your muscle pain, your health care provider will take your medical history. This means he or she will ask you when your muscle pain began and what has been happening. If you have not had muscle pain for very long, your health care provider may want to wait before doing much testing. If your muscle pain has lasted a long time, your health care provider may want to run tests right away. If your health care provider thinks your muscle pain may be caused by illness, you may need to have additional tests to rule out certain conditions.  Treatment for muscle pain depends on the cause. Home care is often enough to relieve muscle pain. Your health care provider may also prescribe anti-inflammatory medicine. HOME CARE INSTRUCTIONS Watch your condition for any changes. The following actions may help to lessen any discomfort you are feeling:  Only take over-the-counter or prescription medicines as directed by your health care provider.  Apply ice to the sore muscle:  Put ice in a plastic bag.  Place a towel between your skin and the bag.  Leave the ice on for 15-20 minutes, 3-4 times a day.  You may alternate applying hot and cold packs to the muscle as directed by your health care provider.  If overuse is causing your muscle pain, slow down your activities until the pain goes away.  Remember that it is normal to feel some muscle pain after starting a workout program. Muscles that have not been used often will be sore at first.  Do regular, gentle exercises if you are not usually active.  Warm up before exercising to lower your risk of muscle pain.  Do not continue working out if the pain is very bad. Bad pain could mean you have injured a muscle. SEEK MEDICAL CARE IF:  Your muscle pain gets worse, and medicines do not help.  You have muscle pain that lasts longer  than 3 days.  You have a rash or fever along with muscle pain.  You have muscle pain after a tick bite.  You have muscle pain while working out, even though you are in good physical condition.  You have redness, soreness, or swelling along with muscle pain.  You have muscle pain after starting a new medicine or changing the dose of a medicine. SEEK IMMEDIATE MEDICAL CARE IF:  You have trouble breathing.  You have trouble swallowing.  You have muscle pain along with a stiff neck, fever, and vomiting.  You have severe muscle weakness or cannot move part of your body. MAKE SURE YOU:   Understand these instructions.  Will watch your condition.  Will get help right away if you are not doing well or get worse. Document Released: 02/06/2006 Document Revised: 03/22/2013 Document Reviewed: 01/11/2013 [facility] Patient Information 2015 [place], [place]. This information is not intended  to replace advice given to you by your health care provider. Make sure you discuss any questions you have with your health care provider. ° °

## 2013-10-05 NOTE — ED Notes (Signed)
Patient is alert and oriented x3.  She was given DC instructions and follow up visit instructions.  Patient gave verbal understanding. She was DC ambulatory under her own power to home.  V/S stable.  He was not showing any signs of distress on DC 

## 2013-10-05 NOTE — ED Notes (Signed)
Lactic Acid given to Dr. Fayrene Fearing.

## 2013-10-05 NOTE — ED Notes (Signed)
Pt presents with c/o of generalized body aches, head ache, chest pain characterized by chest tightness and dry cough. Denies fever/chills, night sweats, remarks on hx of diabetes and fibromyalgia. Pt in NAD, tachardiac at this time.

## 2013-10-14 ENCOUNTER — Encounter: Payer: 59 | Admitting: *Deleted

## 2013-10-14 DIAGNOSIS — IMO0002 Reserved for concepts with insufficient information to code with codable children: Secondary | ICD-10-CM

## 2013-10-14 DIAGNOSIS — E1065 Type 1 diabetes mellitus with hyperglycemia: Secondary | ICD-10-CM

## 2013-10-17 NOTE — Progress Notes (Signed)
Insulin Pump Start Progress Note:  Patient appointment date: 10/14/2013, start time:1200  End time 1400  Patient here for insulin pump start on T-Slim pump and Inset infusion set Orders with pump settings received from MD Patient completed Pre- training by training books, return demonstration  Reviewed Pump Set Up including  Menu Settings  Bolus with Carb Ratio of 1 unit / 13 grams Carb, Correction Factor of 1 unit / 45 mg/dl  Suspend  Basal with initial Basal Rate of 0.85 units/hour  Reservoir Set Up  Utilities Pump Training Checklist completed Used Temp Basal of 8 hours duration @ 50 % basal due to patient taking their long acting insulin yesterday at 9 PM  Patient plans to sign up for [employer] and agrees to upload by Sunday night, 10/16/13 for review of progress and allow for pump setting adjustments  Patient successfully completed pump start and instructed to call me if BG drops below 60 mg/dl or goes above 300 mg/dl or as directed by MD  Follow up plan: patient states they have appointment with Dr. Buddy Duty on Monday AM, 10/17/13. Follow up with me will be by phone if any questions and for review of pump reports.

## 2013-11-25 ENCOUNTER — Emergency Department (HOSPITAL_COMMUNITY)
Admission: EM | Admit: 2013-11-25 | Discharge: 2013-11-26 | Disposition: A | Discharge status: Home / Self Care | Payer: 59 | Attending: Emergency Medicine | Admitting: Emergency Medicine

## 2013-11-25 DIAGNOSIS — E1065 Type 1 diabetes mellitus with hyperglycemia: Secondary | ICD-10-CM

## 2013-11-25 DIAGNOSIS — M25473 Effusion, unspecified ankle: Secondary | ICD-10-CM | POA: Insufficient documentation

## 2013-11-25 DIAGNOSIS — I10 Essential (primary) hypertension: Secondary | ICD-10-CM | POA: Diagnosis not present

## 2013-11-25 DIAGNOSIS — M7989 Other specified soft tissue disorders: Secondary | ICD-10-CM | POA: Diagnosis present

## 2013-11-25 DIAGNOSIS — Z794 Long term (current) use of insulin: Secondary | ICD-10-CM | POA: Diagnosis not present

## 2013-11-25 DIAGNOSIS — M25476 Effusion, unspecified foot: Principal | ICD-10-CM | POA: Insufficient documentation

## 2013-11-25 DIAGNOSIS — Z8719 Personal history of other diseases of the digestive system: Secondary | ICD-10-CM | POA: Insufficient documentation

## 2013-11-25 DIAGNOSIS — E109 Type 1 diabetes mellitus without complications: Secondary | ICD-10-CM | POA: Insufficient documentation

## 2013-11-25 DIAGNOSIS — Z79899 Other long term (current) drug therapy: Secondary | ICD-10-CM | POA: Insufficient documentation

## 2013-11-25 DIAGNOSIS — Z9889 Other specified postprocedural states: Secondary | ICD-10-CM | POA: Diagnosis not present

## 2013-11-25 DIAGNOSIS — R000 Tachycardia, unspecified: Secondary | ICD-10-CM | POA: Diagnosis not present

## 2013-11-25 DIAGNOSIS — M2540 Effusion, unspecified joint: Secondary | ICD-10-CM

## 2013-11-25 NOTE — ED Notes (Signed)
Pt c/o pain and swelling to bilat feet and ankles x 1 week.

## 2013-11-26 ENCOUNTER — Emergency Department (HOSPITAL_COMMUNITY): Payer: 59

## 2013-11-26 ENCOUNTER — Encounter (HOSPITAL_COMMUNITY): Payer: Self-pay | Admitting: Emergency Medicine

## 2013-11-26 LAB — URINALYSIS, ROUTINE W REFLEX MICROSCOPIC
Bilirubin Urine: NEGATIVE
Glucose, UA: >1000 mg/dL — AB
Hgb urine dipstick: NEGATIVE
Ketones, ur: 15 mg/dL — AB
Leukocytes, UA: NEGATIVE
Nitrite: NEGATIVE
Protein, ur: NEGATIVE mg/dL
Specific Gravity, Urine: 1.036 — ABNORMAL HIGH (ref 1.005–1.030)
Urobilinogen, UA: 0.2 mg/dL (ref 0.0–1.0)
pH: 6 (ref 5.0–8.0)

## 2013-11-26 LAB — COMPREHENSIVE METABOLIC PANEL
ALT: 15 U/L (ref 0–35)
AST: 17 U/L (ref 0–37)
Albumin: 3.1 g/dL — ABNORMAL LOW (ref 3.5–5.2)
Alkaline Phosphatase: 114 U/L (ref 39–117)
Anion gap: 15 (ref 5–15)
BUN: 12 mg/dL (ref 6–23)
CO2: 23 mEq/L (ref 19–32)
Calcium: 9.5 mg/dL (ref 8.4–10.5)
Chloride: 98 mEq/L (ref 96–112)
Creatinine, Ser: 0.45 mg/dL — ABNORMAL LOW (ref 0.50–1.10)
GFR calc Af Amer: >90 mL/min (ref >90)
GFR calc non Af Amer: >90 mL/min (ref >90)
Glucose, Bld: 402 mg/dL — ABNORMAL HIGH (ref 70–99)
Potassium: 4.6 mEq/L (ref 3.7–5.3)
Sodium: 136 mEq/L — ABNORMAL LOW (ref 137–147)
Total Bilirubin: 0.3 mg/dL (ref 0.3–1.2)
Total Protein: 7.1 g/dL (ref 6.0–8.3)

## 2013-11-26 LAB — CBC WITH DIFFERENTIAL/PLATELET
Basophils Absolute: 0 10*3/uL (ref 0.0–0.1)
Basophils Relative: 1 % (ref 0–1)
Eosinophils Absolute: 0.1 10*3/uL (ref 0.0–0.7)
Eosinophils Relative: 2 % (ref 0–5)
HCT: 31.9 % — ABNORMAL LOW (ref 36.0–46.0)
Hemoglobin: 11.1 g/dL — ABNORMAL LOW (ref 12.0–15.0)
Lymphocytes Relative: 47 % — ABNORMAL HIGH (ref 12–46)
Lymphs Abs: 2.4 10*3/uL (ref 0.7–4.0)
MCH: 30 pg (ref 26.0–34.0)
MCHC: 34.8 g/dL (ref 30.0–36.0)
MCV: 86.2 fL (ref 78.0–100.0)
Monocytes Absolute: 0.4 10*3/uL (ref 0.1–1.0)
Monocytes Relative: 8 % (ref 3–12)
Neutro Abs: 2.1 10*3/uL (ref 1.7–7.7)
Neutrophils Relative %: 42 % — ABNORMAL LOW (ref 43–77)
Platelets: 255 10*3/uL (ref 150–400)
RBC: 3.7 MIL/uL — ABNORMAL LOW (ref 3.87–5.11)
RDW: 11.3 % — ABNORMAL LOW (ref 11.5–15.5)
WBC: 5.1 10*3/uL (ref 4.0–10.5)

## 2013-11-26 LAB — URINE MICROSCOPIC-ADD ON

## 2013-11-26 LAB — SEDIMENTATION RATE: Sed Rate: 67 mm/hr — ABNORMAL HIGH (ref 0–22)

## 2013-11-26 IMAGING — CR DG ANKLE COMPLETE 3+V*L*
3 series · 3 of 3 positions shown · non-contrast
Comparison: [DATE]

CLINICAL DATA: Pain and swelling to both feet and ankles for 1
week.

EXAM:
LEFT ANKLE COMPLETE - 3+ VIEW

[x ankle ap left]
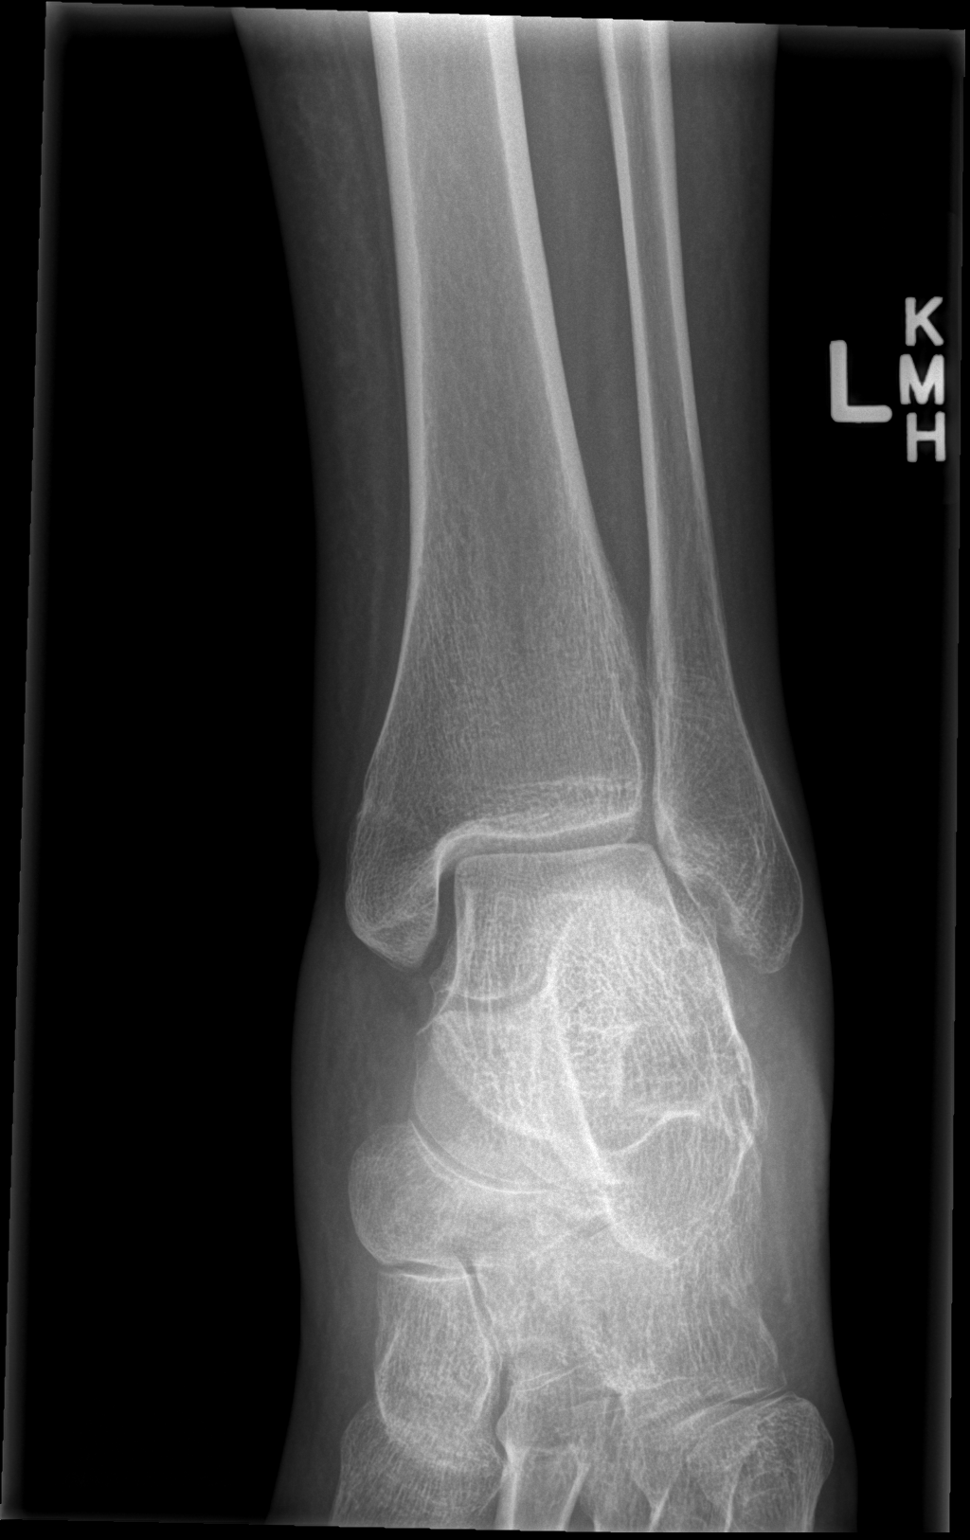

[x ankle obl left]
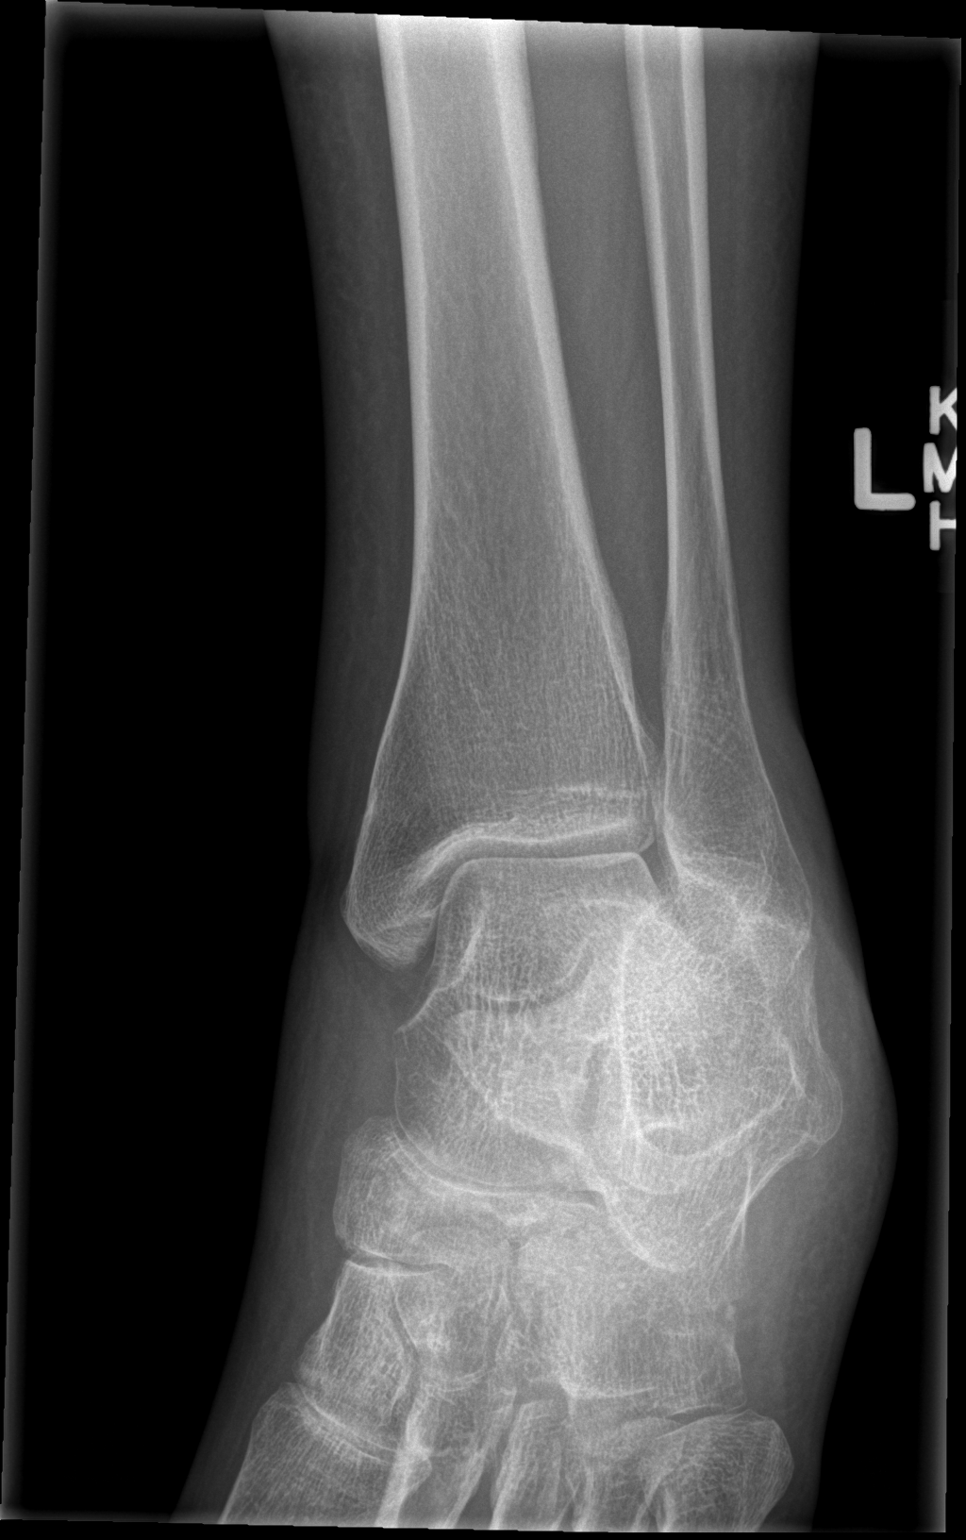

[x ankle lat left]
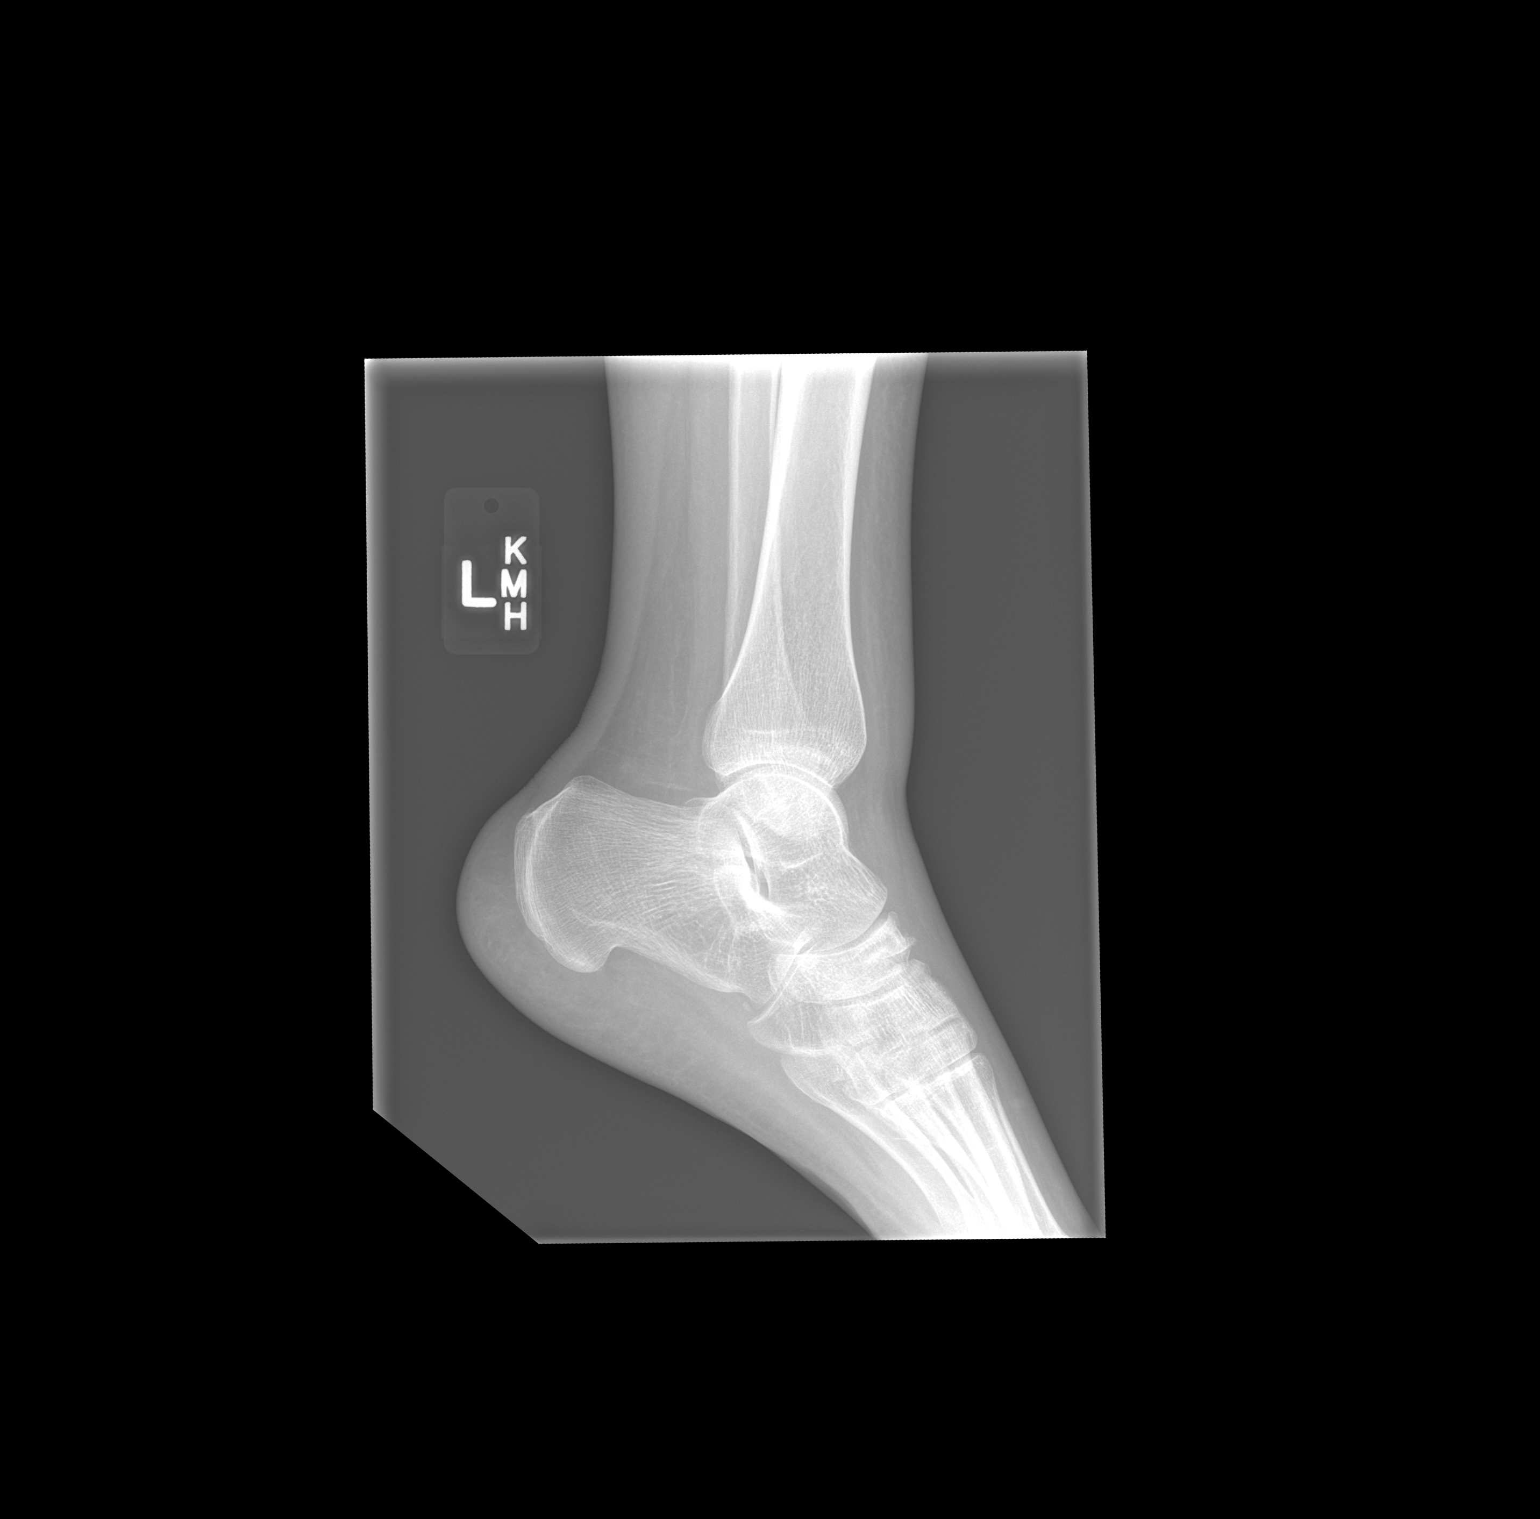

[3 of 3 positions shown; findings below may reference images not displayed]

FINDINGS: There is no evidence of fracture, dislocation, or joint effusion.
There is no evidence of arthropathy or other focal bone abnormality.
Soft tissues are unremarkable.
IMPRESSION: Negative.

## 2013-11-26 IMAGING — CR DG ANKLE COMPLETE 3+V*R*
3 series · 3 of 3 positions shown · non-contrast
Comparison: [DATE].

CLINICAL DATA: Pain and swelling to both feet and ankles for 1
week.

EXAM:
RIGHT ANKLE - COMPLETE 3+ VIEW

[x ankle ap right]
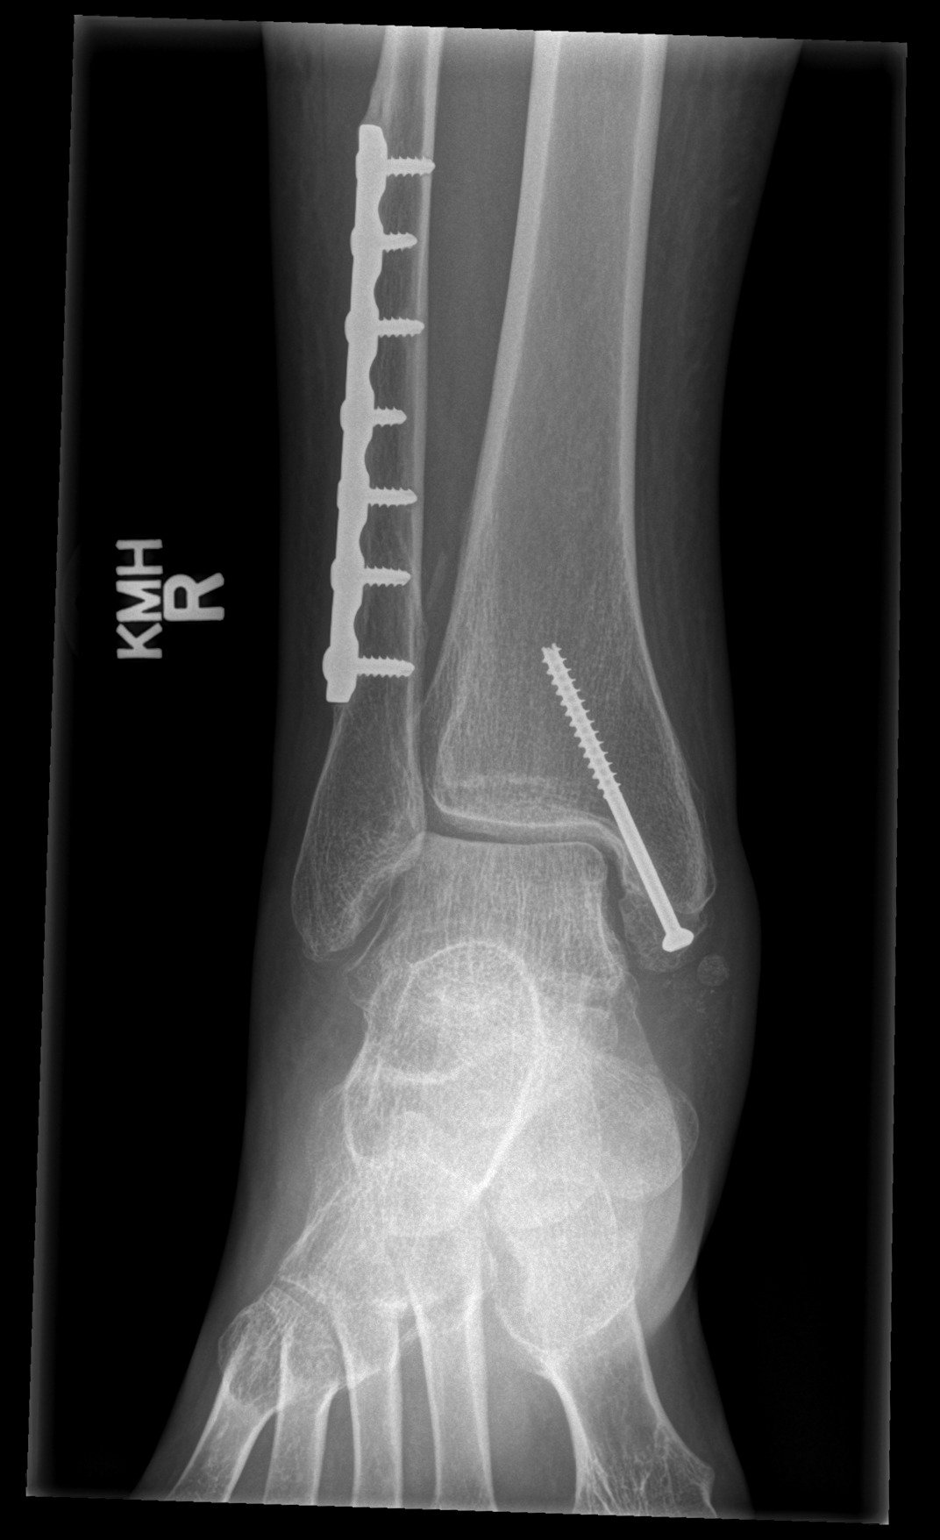

[x ankle obl right]
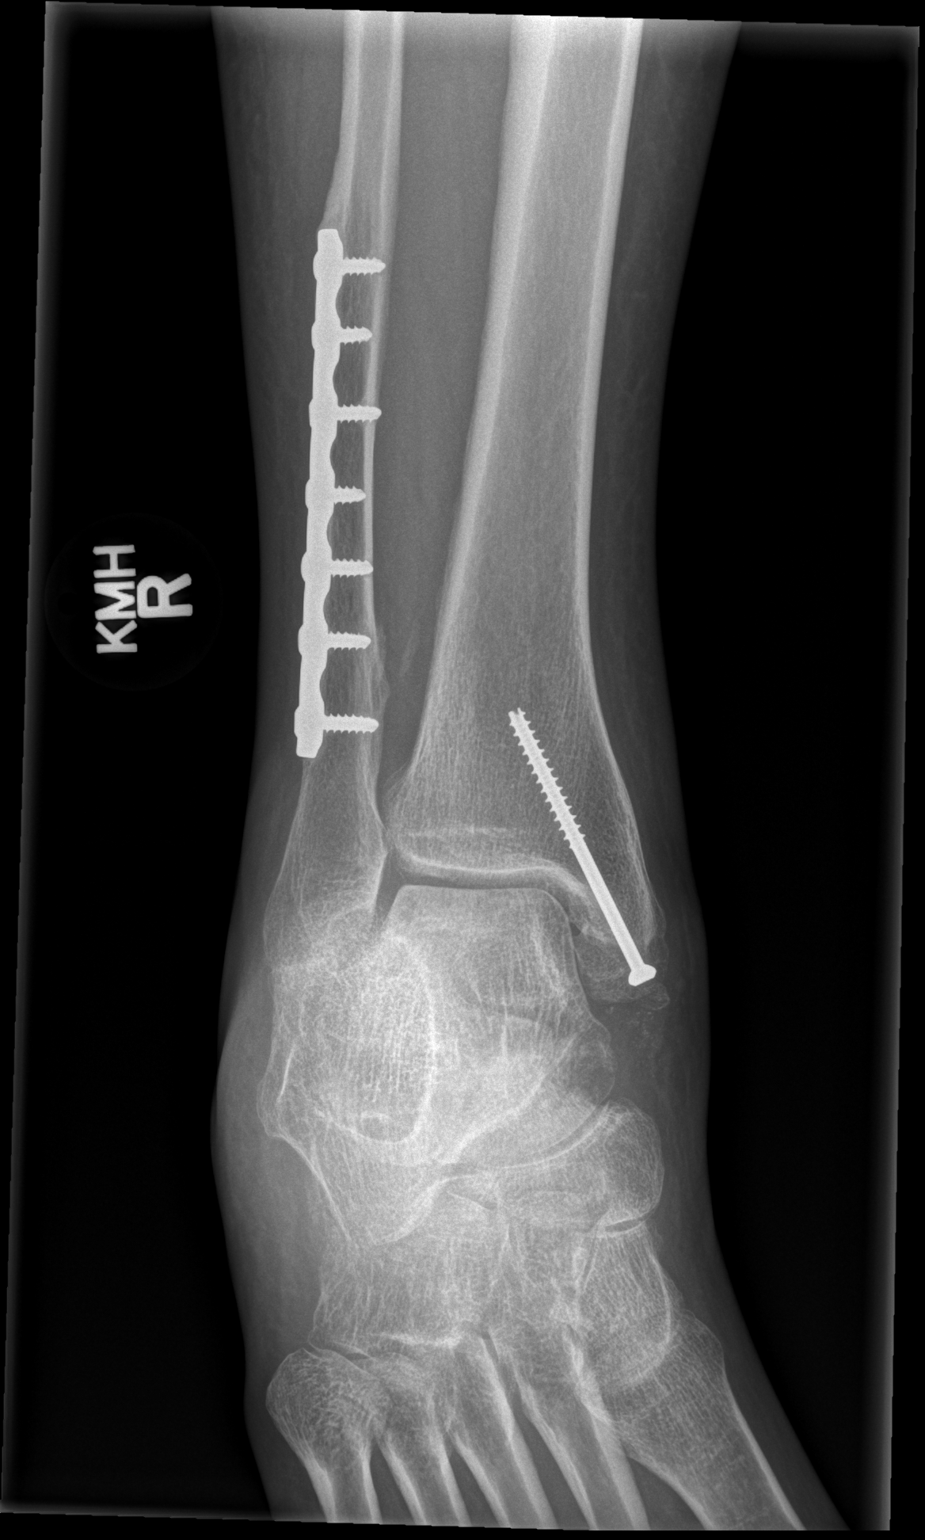

[x ankle lat right]
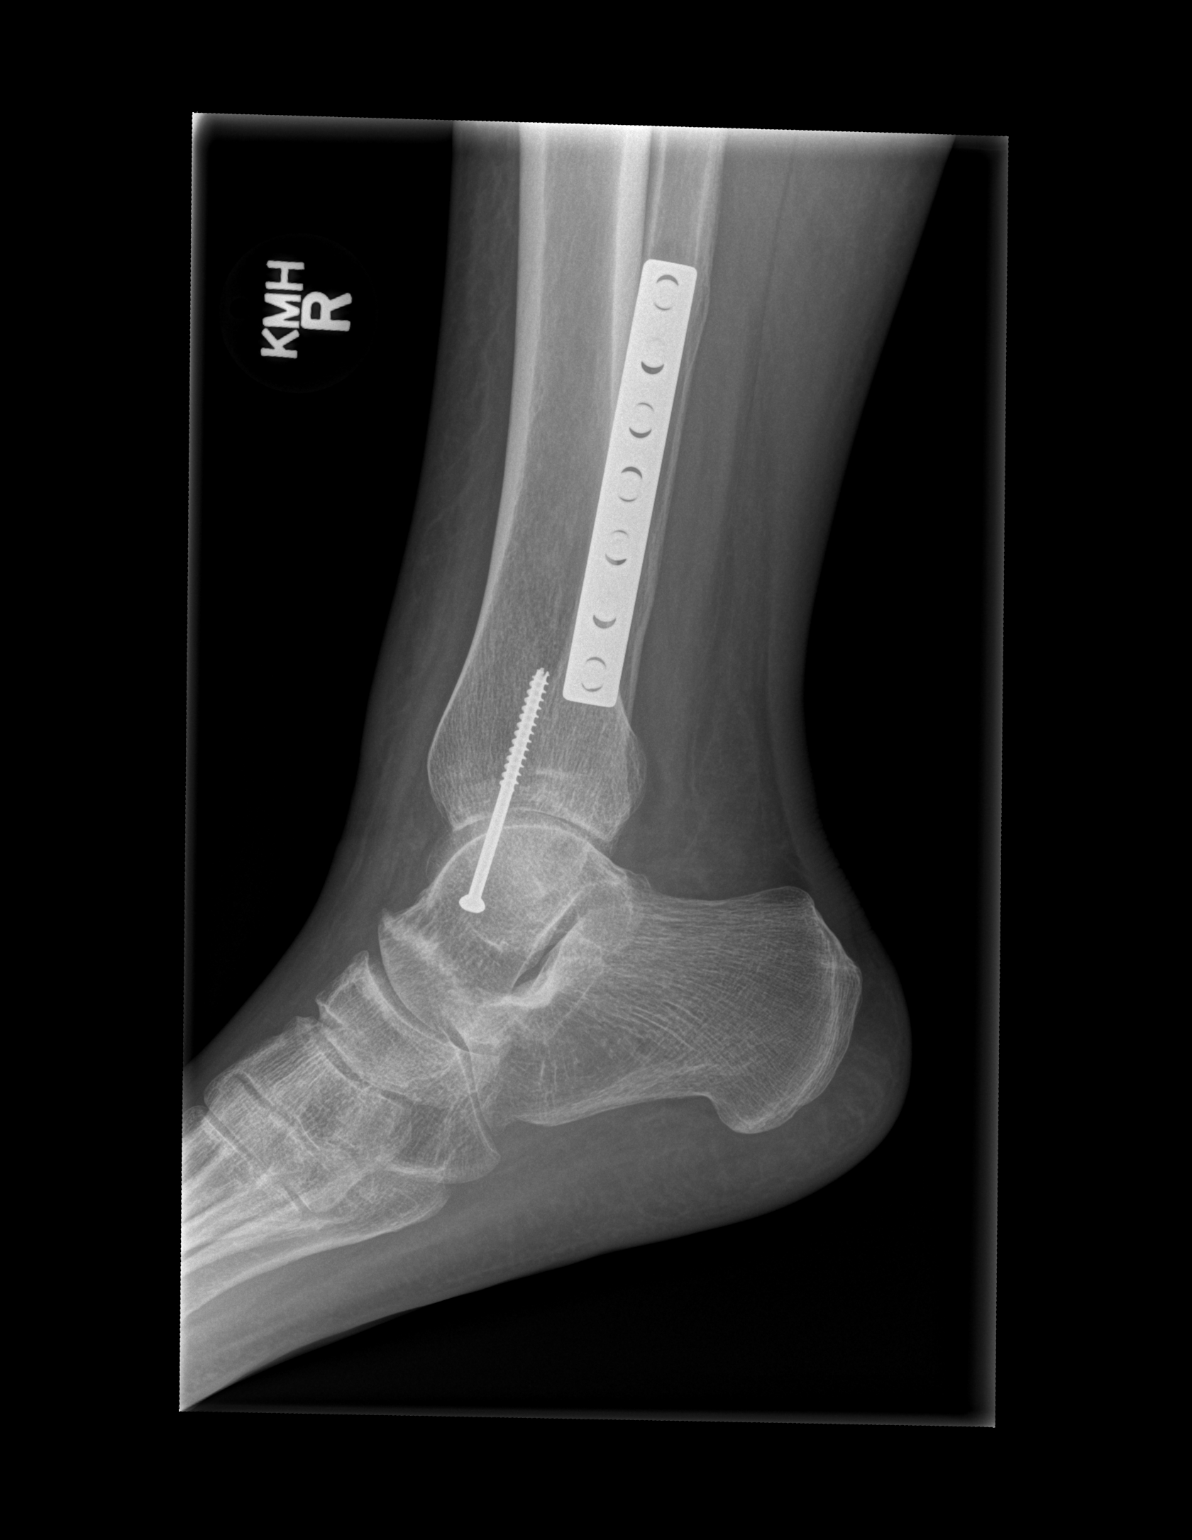

[3 of 3 positions shown; findings below may reference images not displayed]

FINDINGS: Previous plate and screw fixation of the distal fibula and screw
fixation of the medial malleolus. No evidence of acute fracture or
dislocation. There is linear sclerosis in the distal talus which
could indicate stress reaction. Soft tissues are unremarkable.
IMPRESSION: Postoperative internal fixation of the distal right fibula and
medial malleolus. No acute displaced fractures identified. Linear
sclerosis across the distal talus may indicate stress reaction.

## 2013-11-26 MED ORDER — INSULIN ASPART 100 UNIT/ML ~~LOC~~ SOLN
10.0000 [IU] | Freq: Once | SUBCUTANEOUS | Status: AC
  Administered 2013-11-26: 10 [IU] via SUBCUTANEOUS
  Filled 2013-11-26: qty 1

## 2013-11-26 MED ORDER — PREDNISONE 20 MG PO TABS
40.0000 mg | ORAL_TABLET | Freq: Once | ORAL | Status: AC
  Administered 2013-11-26: 40 mg via ORAL
  Filled 2013-11-26: qty 2

## 2013-11-26 MED ORDER — OXYCODONE-ACETAMINOPHEN 5-325 MG PO TABS
2.0000 | ORAL_TABLET | Freq: Once | ORAL | Status: AC
  Administered 2013-11-26: 2 via ORAL
  Filled 2013-11-26: qty 2

## 2013-11-26 MED ORDER — PREDNISONE 20 MG PO TABS: 40.0000 mg | ORAL_TABLET | Freq: Every day | ORAL | Status: DC

## 2013-11-26 NOTE — Discharge Instructions (Signed)
Please follow the directions provided.  It is important you follow up with your primary care provider this week to follow this joint swelling and your high blood sugars.    Seek Medical Attention If You: Have severe or worsening swelling, Have red, hot or painful joints Have fever of 100.4 or greater or chills Have difficulty walking

## 2013-11-26 NOTE — ED Provider Notes (Signed)
23 year old female with a history of diabetes who also states that she has "fibromyalgia". She presents with bilateral ankle pain which started approximately 2 weeks ago, is persistent, gradually worsening and associated with swelling at the bilateral ankles. She denies any other joint arthropathies, has no history of gout, has not had recent surgery, has been hyperglycemic up to 500 today. There is no fevers chills nausea or vomiting. On exam the patient has bilateral swollen ankles with minimal pitting edema, no pain at the base of the large toes, not significantly tender with range of motion. She is mildly tachycardic but otherwise has a benign exam. Evaluation for the source of the patient's polyarthralgia with lab work, sedimentation rate, check for diabetic ketoacidosis given hyperglycemia and tachycardia.  No acidosis, but with small ketones in urine and hyperglycemia - likely has some dehydration - drinking well without n/v.  She states she has chronic tachycardia - she is comfortable with plan for d/c  Medical screening examination/treatment/procedure(s) were conducted as a shared visit with non-physician practitioner(s) and myself.  I personally evaluated the patient during the encounter.  Clinical Impression: swelling of bilateral ankles.      Johnna Acosta, MD 11/27/13 [phone]

## 2013-11-26 NOTE — ED Provider Notes (Signed)
CSN: [id number]     Arrival date & time 11/25/13  2316 History   First MD Initiated Contact with Patient 11/26/13 0129     Chief Complaint  Patient presents with  . Leg Swelling   (Consider location/radiation/quality/duration/timing/severity/associated sxs/prior Treatment) HPI [name] is a 23 yo female with PMH: diabetes, type 1, presenting to the ED with c/o bilat ankle pain and swelling x 2 weeks.  Pt states she was in her usual state of health until about 2 weeks ago when her right ankle began to swelling and become more painful to walk on, the next day her left ankle became swollen and tender also.  She states the swelling becomes worse throughout the day and improves after a night's rest.  She rates the pain as 8/10 and has tried epsom soaks, ibuprofen, aleve and ice at home without improvement.  She denies fever, chills, muscle aches, abd pain, nausea, vomiting or rash.    Past Medical History  Diagnosis Date  . Diabetes mellitus   . Hypertension   . Diabetes type 1, uncontrolled 11/14/2011    Since age 79  . Gastroparesis    Past Surgical History  Procedure Laterality Date  . Ankle surgery    . Cholecystectomy  11/15/2011    Procedure: LAPAROSCOPIC CHOLECYSTECTOMY WITH INTRAOPERATIVE CHOLANGIOGRAM;  Surgeon: [name], MD;  Location: [facility];  Service: General;  Laterality: N/A;  . Laparoscopy  11/23/2011    Procedure: LAPAROSCOPY DIAGNOSTIC;  Surgeon: Mariella Saa, MD;  Location: [facility];  Service: General;  Laterality: N/A;  . Esophagogastroduodenoscopy  12/03/2011    Procedure: ESOPHAGOGASTRODUODENOSCOPY (EGD);  Surgeon: Theda Belfast, MD;  Location: [facility];  Service: Endoscopy;  Laterality: N/A;   Family History  Problem Relation Age of Onset  . Diabetes Mother   . Hypertension Father    History  Substance Use Topics  . Smoking status: Never Smoker   . Smokeless tobacco: Never Used  . Alcohol Use: Yes     Comment: Occasional, once a month mixed  drink   OB History   Grav Para Term Preterm Abortions TAB SAB Ect Mult Living                 Review of Systems  Constitutional: Negative for fever and chills.  HENT: Negative for sore throat.   Eyes: Negative for visual disturbance.  Respiratory: Negative for cough and shortness of breath.   Cardiovascular: Negative for chest pain and leg swelling.  Gastrointestinal: Negative for nausea, vomiting and diarrhea.  Genitourinary: Negative for dysuria.  Musculoskeletal: Positive for arthralgias and joint swelling. Negative for myalgias.  Skin: Negative for rash.  Neurological: Negative for weakness, numbness and headaches.      Allergies  Peanut-containing drug products  Home Medications   Prior to Admission medications   Medication Sig Start Date End Date Taking? Authorizing Provider  DULoxetine (CYMBALTA) 30 MG capsule Take 60 mg by mouth 2 (two) times daily.   Yes Historical Provider, MD  gabapentin (NEURONTIN) 400 MG capsule Take 400 mg by mouth 4 (four) times daily.   Yes Historical Provider, MD  Insulin Human (INSULIN PUMP) SOLN Inject 16 each into the skin 3 times daily with meals, bedtime and 2 AM.   Yes Historical Provider, MD  magnesium oxide (MAG-OX) 400 MG tablet Take 400 mg by mouth at bedtime.   Yes Historical Provider, MD  metoCLOPramide (REGLAN) 10 MG tablet Take 10 mg by mouth 3 (three) times daily as needed  for nausea. Takes with each meal only as needed for nausea. 03/16/12  Yes Srikar Cherlynn Kaiser, MD  oxyCODONE-acetaminophen (PERCOCET/ROXICET) 5-325 MG per tablet Take 2 tablets by mouth every 4 (four) hours as needed for severe pain.   Yes Historical Provider, MD  promethazine (PHENERGAN) 25 MG tablet Take 25 mg by mouth every 6 (six) hours as needed for nausea or vomiting. For nausea   Yes Historical Provider, MD  insulin glargine (LANTUS) 100 UNIT/ML injection Inject 15 Units into the skin 2 (two) times daily.     Historical Provider, MD   BP 132/81  Pulse 109   Temp(Src) 98.6 F (37 C) (Oral)  Resp 20  Ht 5\' 7"  (1.702 m)  Wt 160 lb (72.576 kg)  BMI 25.05 kg/m2  SpO2 99%  LMP 11/05/2013 Physical Exam  Nursing note and vitals reviewed. Constitutional: She is oriented to person, place, and time. She appears well-developed and well-nourished. No distress.  HENT:  Head: Normocephalic and atraumatic.  Mouth/Throat: Oropharynx is clear and moist. No oropharyngeal exudate.  Eyes: Conjunctivae are normal. Pupils are equal, round, and reactive to light. No scleral icterus.  Neck: Neck supple. No thyromegaly present.  Cardiovascular: Regular rhythm and intact distal pulses.  Tachycardia present.   Pulmonary/Chest: Effort normal and breath sounds normal. No respiratory distress. She has no wheezes. She has no rales. She exhibits no tenderness.  Abdominal: Soft. There is no tenderness.  Musculoskeletal:       Right ankle: She exhibits swelling. Tenderness.       Left ankle: She exhibits swelling. Tenderness.  Lymphadenopathy:    She has no cervical adenopathy.  Neurological: She is alert and oriented to person, place, and time. She has normal strength. No cranial nerve deficit or sensory deficit. Coordination normal.  Skin: Skin is warm and dry. No rash noted. She is not diaphoretic.  Psychiatric: She has a normal mood and affect.    ED Course  Procedures (including critical care time) Labs Review Labs Reviewed  CBC WITH DIFFERENTIAL - Abnormal; Notable for the following:    RBC 3.70 (*)    Hemoglobin 11.1 (*)    HCT 31.9 (*)    RDW 11.3 (*)    Neutrophils Relative % 42 (*)    Lymphocytes Relative 47 (*)    All other components within normal limits  COMPREHENSIVE METABOLIC PANEL - Abnormal; Notable for the following:    Sodium 136 (*)    Glucose, Bld 402 (*)    Creatinine, Ser 0.45 (*)    Albumin 3.1 (*)    All other components within normal limits  SEDIMENTATION RATE - Abnormal; Notable for the following:    Sed Rate 67 (*)    All other  components within normal limits  URINALYSIS, ROUTINE W REFLEX MICROSCOPIC - Abnormal; Notable for the following:    Specific Gravity, Urine 1.036 (*)    Glucose, UA >1000 (*)    Ketones, ur 15 (*)    All other components within normal limits  URINE MICROSCOPIC-ADD ON - Abnormal; Notable for the following:    Bacteria, UA MANY (*)    All other components within normal limits  B. BURGDORFI ANTIBODIES    Imaging Review Dg Ankle Complete Left  11/26/2013   CLINICAL DATA:  Pain and swelling to both feet and ankles for 1 week.  EXAM: LEFT ANKLE COMPLETE - 3+ VIEW  COMPARISON:  12/13/2011  FINDINGS: There is no evidence of fracture, dislocation, or joint effusion. There is no evidence  of arthropathy or other focal bone abnormality. Soft tissues are unremarkable.  IMPRESSION: Negative.   Electronically Signed   By: Burman Nieves M.D.   On: 11/26/2013 04:03   Dg Ankle Complete Right  11/26/2013   CLINICAL DATA:  Pain and swelling to both feet and ankles for 1 week.  EXAM: RIGHT ANKLE - COMPLETE 3+ VIEW  COMPARISON:  02/20/2004.  FINDINGS: Previous plate and screw fixation of the distal fibula and screw fixation of the medial malleolus. No evidence of acute fracture or dislocation. There is linear sclerosis in the distal talus which could indicate stress reaction. Soft tissues are unremarkable.  IMPRESSION: Postoperative internal fixation of the distal right fibula and medial malleolus. No acute displaced fractures identified. Linear sclerosis across the distal talus may indicate stress reaction.   Electronically Signed   By: Burman Nieves M.D.   On: 11/26/2013 04:04     EKG Interpretation None      MDM   Final diagnoses:  Joint swelling  Hyperglycemia due to type 1 diabetes mellitus   Pt is 23 yo female presenting with bilat ankle swelling and pain x 2 weeks.  Concern for rheumatoid arthritis, lyme disease, gout, lupus, and vasculitis.  CBC, CMP, Sed Rate, Lyme Titer, and UA done.   Glucose was elevated to 402 and treated with sq insulin as pt's insulin pump did not seem to be working.  Pt's sed rate elevated to 67 and treated with prednisone.  BMP is reassuring for DKA despite elevated blood sugar.  Pt does not have elevated WBC.  UA negative for infection, but does show elevated glucose.  Pt's pain was managed in the department.  Pt is tachycardic on monitor but reports history of tachycardia and followed by pcp.  Pt appears comfortable and safe to be discharged home.  Discharge instructions include strict instructions for follow-up with primary care provider for management of swollen ankles and follow-up of elevated blood sugar.  Return precautions provided.    Meds given in ED:  Medications  oxyCODONE-acetaminophen (PERCOCET/ROXICET) 5-325 MG per tablet 2 tablet (2 tablets Oral Given 11/26/13 0419)  predniSONE (DELTASONE) tablet 40 mg (40 mg Oral Given 11/26/13 0419)  insulin aspart (novoLOG) injection 10 Units (10 Units Subcutaneous Given 11/26/13 0541)    Discharge Medication List as of 11/26/2013  5:27 AM    START taking these medications   Details  predniSONE (DELTASONE) 20 MG tablet Take 2 tablets (40 mg total) by mouth daily. Take 40 mg by mouth daily for 3 days, then  by mouth daily for 3 days, then  daily for 3 days, Starting 11/26/2013, Until Discontinued, Print        Filed Vitals:   11/25/13 2329 11/26/13 0000 11/26/13 0307 11/26/13 0623  BP: 132/81  126/76 111/66  Pulse: 109  117 116  Temp: 98.6 F (37 C)  98.1 F (36.7 C)   TempSrc: Oral  Oral   Resp: Height:   (1.702 m)    Weight:  160 lb (72.576 kg)    SpO2: 99%  96% 97%     Harle Battiest, NP 11/26/13 [phone]

## 2013-11-27 NOTE — ED Provider Notes (Signed)
Medical screening examination/treatment/procedure(s) were conducted as a shared visit with non-physician practitioner(s) and myself.  I personally evaluated the patient during the encounter  Please see my separate respective documentation pertaining to this patient encounter   Johnna Acosta, MD 11/27/13 [phone]

## 2013-11-28 LAB — B. BURGDORFI ANTIBODIES: B burgdorferi Ab IgG+IgM: 0.43 {ISR}

## 2014-01-30 ENCOUNTER — Telehealth: Payer: Self-pay | Admitting: Nurse Practitioner

## 2014-01-30 MED ORDER — DULOXETINE HCL 30 MG PO CPEP: 30.0000 mg | ORAL_CAPSULE | Freq: Two times a day (BID) | ORAL | Status: DC

## 2014-01-30 NOTE — Telephone Encounter (Signed)
Dose was changed to 2 daily from note on 04/22.  Rx has been sent.  I called the patient back.  She is aware.

## 2014-01-30 NOTE — Telephone Encounter (Signed)
Patient requesting Rx refill for DULoxetine (CYMBALTA) 30 MG capsule.  Please call and advise.

## 2014-03-31 NOTE — L&D Delivery Note (Signed)
Operative Delivery Note At 12:33 AM a viable female was delivered via Vaginal, Vacuum Neurosurgeon).  Presentation: vertex; Position: Left,, Occiput,, Anterior; Station: +3 via Vacuum Extraction because of poor pushing efforts and tight outlet.  Delivery accomplished with an episiotomy and 1 pull with the Mity Vac Mushroom Cup.  Verbal consent: obtained from patient.  Risks and benefits discussed in detail.  Risks include, but are not limited to the risks of anesthesia, bleeding, infection, damage to maternal tissues, fetal cephalhematoma.  There is also the risk of inability to effect vaginal delivery of the head, or shoulder dystocia that cannot be resolved by established maneuvers, leading to the need for emergency cesarean section.   APGAR: 8-9 , ; weight 6 lb 14 oz (3118 g).   Placenta status: Spontaneous, intact, .   Cord: 3 vessel  with the following complications: None .  Cord pH: none  Anesthesia: Epidural  Instruments: Mity Vac Mushroom Cup Episiotomy: Median Lacerations: 3rd Degree  Suture Repair: 2.0 chromic vicryl Est. Blood Loss (mL): 500  Mom to AICU.  Baby to Couplet care / Skin to Skin.  HARPER,CHARLES A 01/26/2015, 1:33 AM

## 2014-05-19 ENCOUNTER — Telehealth: Payer: Self-pay | Admitting: Diagnostic Neuroimaging

## 2014-05-19 ENCOUNTER — Other Ambulatory Visit: Payer: Self-pay | Admitting: Nurse Practitioner

## 2014-05-19 NOTE — Telephone Encounter (Signed)
I called back.  [name] wall have the patient call us back to schedule appt.

## 2014-05-19 NOTE — Telephone Encounter (Signed)
Patient requesting Rx refill for DULoxetine (CYMBALTA) 30 MG capsule.  Has not been seen in office since Feb 2015 with Charlott Holler, NP.  Please call and advise.

## 2014-05-22 NOTE — Telephone Encounter (Signed)
Patient has appt scheduled.  Dose was changed per note on 04/22

## 2014-05-22 NOTE — Telephone Encounter (Signed)
Patient made appt and Rx has been sent.

## 2014-05-24 ENCOUNTER — Ambulatory Visit: Payer: Self-pay | Admitting: Diagnostic Neuroimaging

## 2014-05-25 ENCOUNTER — Encounter: Payer: Self-pay | Admitting: Diagnostic Neuroimaging

## 2014-07-05 ENCOUNTER — Other Ambulatory Visit (INDEPENDENT_AMBULATORY_CARE_PROVIDER_SITE_OTHER): Payer: 59

## 2014-07-05 ENCOUNTER — Encounter: Payer: Self-pay | Admitting: Obstetrics

## 2014-07-05 ENCOUNTER — Telehealth: Payer: Self-pay

## 2014-07-05 VITALS — BP 111/73 | HR 112 | Temp 98.2°F | Ht 67.0 in | Wt 154.0 lb

## 2014-07-05 DIAGNOSIS — Z3401 Encounter for supervision of normal first pregnancy, first trimester: Secondary | ICD-10-CM | POA: Diagnosis not present

## 2014-07-05 DIAGNOSIS — Z3200 Encounter for pregnancy test, result unknown: Secondary | ICD-10-CM

## 2014-07-05 LAB — POCT URINE PREGNANCY: Preg Test, Ur: POSITIVE

## 2014-07-05 NOTE — Telephone Encounter (Signed)
Spoke with patient regarding her NOB pmt plan of 305.83 per month starting at first appt in May - she will pay or will get mediciad

## 2014-07-05 NOTE — Progress Notes (Signed)
Patient in office for a confirmation of pregnancy. Patient states she has had several positive home pregnancy test. Pregnancy test in office is positive. Patient states it was an intended pregnancy. Patient states she is taking prenatal vitamins and has been encourage to continue them. Patient has scheduled a NOB appointment. Patient advised of no show policy and verbalizes understanding.   BP 111/73 mmHg  Pulse 112  Temp(Src) 98.2 F (36.8 C)  Ht 5\' 7"  (1.702 m)  Wt 154 lb (69.854 kg)  BMI 24.11 kg/m2  LMP 05/26/2014

## 2014-07-05 NOTE — Addendum Note (Signed)
Addended by: Carole Binning on: 07/05/2014 09:57 AM   Modules accepted: Orders

## 2014-07-06 LAB — OBSTETRIC PANEL
Antibody Screen: NEGATIVE
Basophils Absolute: 0 10*3/uL (ref 0.0–0.1)
Basophils Relative: 0 % (ref 0–1)
Eosinophils Absolute: 0.2 10*3/uL (ref 0.0–0.7)
Eosinophils Relative: 3 % (ref 0–5)
HCT: 36 % (ref 36.0–46.0)
Hemoglobin: 12.2 g/dL (ref 12.0–15.0)
Hepatitis B Surface Ag: NEGATIVE
Lymphocytes Relative: 35 % (ref 12–46)
Lymphs Abs: 1.8 10*3/uL (ref 0.7–4.0)
MCH: 31 pg (ref 26.0–34.0)
MCHC: 33.9 g/dL (ref 30.0–36.0)
MCV: 91.6 fL (ref 78.0–100.0)
MPV: 9.3 fL (ref 8.6–12.4)
Monocytes Absolute: 0.4 10*3/uL (ref 0.1–1.0)
Monocytes Relative: 7 % (ref 3–12)
Neutro Abs: 2.8 10*3/uL (ref 1.7–7.7)
Neutrophils Relative %: 55 % (ref 43–77)
Platelets: 228 10*3/uL (ref 150–400)
RBC: 3.93 MIL/uL (ref 3.87–5.11)
RDW: 12.6 % (ref 11.5–15.5)
Rh Type: POSITIVE
Rubella: 0.87 Index (ref <0.90)
WBC: 5.1 10*3/uL (ref 4.0–10.5)

## 2014-07-06 LAB — VARICELLA ZOSTER ANTIBODY, IGG: Varicella IgG: 1287 Index — ABNORMAL HIGH (ref <135.00)

## 2014-07-06 LAB — VITAMIN D 25 HYDROXY (VIT D DEFICIENCY, FRACTURES): Vit D, 25-Hydroxy: 14 ng/mL — ABNORMAL LOW (ref 30–100)

## 2014-07-06 LAB — HIV ANTIBODY (ROUTINE TESTING W REFLEX): HIV 1&2 Ab, 4th Generation: NONREACTIVE

## 2014-07-07 LAB — HEMOGLOBINOPATHY EVALUATION
Hemoglobin Other: 0 %
Hgb A2 Quant: 2.4 % (ref 2.2–3.2)
Hgb A: 97.6 % (ref 96.8–97.8)
Hgb F Quant: 0 % (ref 0.0–2.0)
Hgb S Quant: 0 %

## 2014-07-09 LAB — CULTURE, OB URINE: Colony Count: 85000

## 2014-07-10 ENCOUNTER — Other Ambulatory Visit: Payer: Self-pay | Admitting: Certified Nurse Midwife

## 2014-07-10 ENCOUNTER — Ambulatory Visit: Payer: Self-pay | Admitting: Diagnostic Neuroimaging

## 2014-07-10 DIAGNOSIS — O2341 Unspecified infection of urinary tract in pregnancy, first trimester: Secondary | ICD-10-CM

## 2014-07-10 MED ORDER — NITROFURANTOIN MONOHYD MACRO 100 MG PO CAPS: 100.0000 mg | ORAL_CAPSULE | Freq: Two times a day (BID) | ORAL | Status: AC

## 2014-07-11 ENCOUNTER — Encounter: Payer: Self-pay | Admitting: Diagnostic Neuroimaging

## 2014-07-20 ENCOUNTER — Inpatient Hospital Stay (HOSPITAL_COMMUNITY)
Admission: AD | Admit: 2014-07-20 | Discharge: 2014-07-20 | Disposition: A | Discharge status: Home / Self Care | Payer: 59 | Source: Ambulatory Visit | Attending: Obstetrics | Admitting: Obstetrics

## 2014-07-20 ENCOUNTER — Encounter (HOSPITAL_COMMUNITY): Payer: Self-pay | Admitting: *Deleted

## 2014-07-20 DIAGNOSIS — O24011 Pre-existing diabetes mellitus, type 1, in pregnancy, first trimester: Secondary | ICD-10-CM | POA: Insufficient documentation

## 2014-07-20 DIAGNOSIS — Z794 Long term (current) use of insulin: Secondary | ICD-10-CM | POA: Diagnosis not present

## 2014-07-20 DIAGNOSIS — E10649 Type 1 diabetes mellitus with hypoglycemia without coma: Secondary | ICD-10-CM

## 2014-07-20 DIAGNOSIS — O210 Mild hyperemesis gravidarum: Secondary | ICD-10-CM | POA: Insufficient documentation

## 2014-07-20 DIAGNOSIS — O219 Vomiting of pregnancy, unspecified: Secondary | ICD-10-CM

## 2014-07-20 DIAGNOSIS — E109 Type 1 diabetes mellitus without complications: Secondary | ICD-10-CM | POA: Diagnosis not present

## 2014-07-20 DIAGNOSIS — Z3A01 Less than 8 weeks gestation of pregnancy: Secondary | ICD-10-CM | POA: Diagnosis not present

## 2014-07-20 HISTORY — DX: Unspecified infectious disease: B99.9

## 2014-07-20 HISTORY — DX: Anxiety disorder, unspecified: F41.9

## 2014-07-20 LAB — URINALYSIS, ROUTINE W REFLEX MICROSCOPIC
Bilirubin Urine: NEGATIVE
Glucose, UA: >1000 mg/dL — AB
Hgb urine dipstick: NEGATIVE
Ketones, ur: NEGATIVE mg/dL
Leukocytes, UA: NEGATIVE
Nitrite: NEGATIVE
Protein, ur: NEGATIVE mg/dL
Specific Gravity, Urine: 1.015 (ref 1.005–1.030)
Urobilinogen, UA: 0.2 mg/dL (ref 0.0–1.0)
pH: 5.5 (ref 5.0–8.0)

## 2014-07-20 LAB — COMPREHENSIVE METABOLIC PANEL
ALT: 22 U/L (ref 0–35)
AST: 22 U/L (ref 0–37)
Albumin: 3.7 g/dL (ref 3.5–5.2)
Alkaline Phosphatase: 64 U/L (ref 39–117)
Anion gap: 9 (ref 5–15)
BUN: 8 mg/dL (ref 6–23)
CO2: 24 mmol/L (ref 19–32)
Calcium: 9.2 mg/dL (ref 8.4–10.5)
Chloride: 105 mmol/L (ref 96–112)
Creatinine, Ser: 0.33 mg/dL — ABNORMAL LOW (ref 0.50–1.10)
GFR calc Af Amer: >90 mL/min (ref >90)
GFR calc non Af Amer: >90 mL/min (ref >90)
Glucose, Bld: 38 mg/dL — CL (ref 70–99)
Potassium: 3.3 mmol/L — ABNORMAL LOW (ref 3.5–5.1)
Sodium: 138 mmol/L (ref 135–145)
Total Bilirubin: 0.5 mg/dL (ref 0.3–1.2)
Total Protein: 7.6 g/dL (ref 6.0–8.3)

## 2014-07-20 LAB — CBC
HCT: 34.1 % — ABNORMAL LOW (ref 36.0–46.0)
Hemoglobin: 11.7 g/dL — ABNORMAL LOW (ref 12.0–15.0)
MCH: 32.1 pg (ref 26.0–34.0)
MCHC: 34.3 g/dL (ref 30.0–36.0)
MCV: 93.7 fL (ref 78.0–100.0)
Platelets: 297 10*3/uL (ref 150–400)
RBC: 3.64 MIL/uL — ABNORMAL LOW (ref 3.87–5.11)
RDW: 13 % (ref 11.5–15.5)
WBC: 6.5 10*3/uL (ref 4.0–10.5)

## 2014-07-20 LAB — URINE MICROSCOPIC-ADD ON

## 2014-07-20 LAB — GLUCOSE, CAPILLARY
Glucose-Capillary: 50 mg/dL — ABNORMAL LOW (ref 70–99)
Glucose-Capillary: 73 mg/dL (ref 70–99)

## 2014-07-20 MED ORDER — DOXYLAMINE-PYRIDOXINE 10-10 MG PO TBEC: DELAYED_RELEASE_TABLET | ORAL | Status: DC

## 2014-07-20 MED ORDER — PROMETHAZINE HCL 25 MG PO TABS: 25.0000 mg | ORAL_TABLET | Freq: Four times a day (QID) | ORAL | Status: DC | PRN

## 2014-07-20 NOTE — Discharge Instructions (Signed)
Diabetes and Sick Day Management Blood sugar (glucose) can be more difficult to control when you are sick. Colds, fever, flu, nausea, vomiting, and diarrhea are all examples of common illnesses that can cause problems for people with diabetes. Loss of body fluids (dehydration) from fever, vomiting, diarrhea, infection, and the stress of a sickness can all cause blood glucose levels to increase. Because of this, it is very important to take your diabetes medicines and to eat some form of carbohydrate food when you are sick. Liquid or soft foods are often tolerated, and they help to replace fluids. HOME CARE INSTRUCTIONS These main guidelines are intended for managing a short-term (24 hours or less) sickness:  Take your usual dose of insulin or oral diabetes medicine. An exception would be if you take any form of metformin. If you cannot eat or drink, you can become dehydrated and should not take this medicine.  Continue to take your insulin even if you are unable to eat solid foods or are vomiting. Your insulin dose may stay the same, or it may need to be increased when you are sick.  You will need to test your blood glucose more often, generally every 2-4 hours. If you have type 1 diabetes, test your urine for ketones every 4 hours. If you have type 2 diabetes, test your urine for ketones as directed by your health care provider.  Eat some form of food that contains carbohydrates. The carbohydrates can be in solid or liquid form. You should eat 45-50 g of carbohydrates every 3-4 hours.  Replace fluids if you have a fever, vomit, or have diarrhea. Ask your health care provider for specific rehydration instructions.  Watch carefully for the signs of ketoacidosis if you have type 1 diabetes. Call your health care provider if any of the following symptoms are present, especially in children:  Moderate to large ketones in the urine along with a high blood glucose level.  Severe  nausea.  Vomiting.  Diarrhea.  Abdominal pain.  Rapid breathing.  Drink extra liquids that do not contain sugar such as water.  Be careful with over-the-counter medicines. Read the labels. They may contain sugar or types of sugars that can increase your blood glucose level. Food Choices for Illness All of the food choices below contain about 15 g of carbohydrates. Plan ahead and keep some of these foods around.    to  cup carbonated beverage containing sugar. Carbonated beverages will usually be better tolerated if they are opened and left at room temperature for a few minutes.   of a twin frozen ice pop.   cup regular gelatin.   cup juice.   cup ice cream or frozen yogurt.   cup cooked cereal.   cup sherbet.  1 cup clear broth or soup.  1 cup cream soup.   cup regular custard.   cup regular pudding.  1 cup sports drink.  1 cup plain yogurt.  1 slice toast.  6 squares saltine crackers.  5 vanilla wafers. SEEK MEDICAL CARE IF:   You are unable to drink fluids, even small amounts.  You have nausea and vomiting for more than 6 hours.  You have diarrhea for more than 6 hours.  Your blood glucose level is more than 240 mg/dL, even with additional insulin.  There is a change in mental status.  You develop an additional serious sickness.  You have been sick for 2 days and are not getting better.  You have a fever. SEEK IMMEDIATE  MEDICAL CARE IF:  You have difficulty breathing.  You have moderate to large ketone levels. MAKE SURE YOU:  Understand these instructions.  Will watch your condition.  Will get help right away if you are not doing well or get worse. Document Released: 03/20/2003 Document Revised: 08/01/2013 Document Reviewed: 08/24/2012 [facility] Patient Information 2015 [place], [place]. This information is not intended to replace advice given to you by your health care provider. Make sure you discuss any questions you have with  your health care provider.  Morning Sickness Morning sickness is when you feel sick to your stomach (nauseous) during pregnancy. This nauseous feeling may or may not come with vomiting. It often occurs in the morning but can be a problem any time of day. Morning sickness is most common during the first trimester, but it may continue throughout pregnancy. While morning sickness is unpleasant, it is usually harmless unless you develop severe and continual vomiting (hyperemesis gravidarum). This condition requires more intense treatment.  CAUSES  The cause of morning sickness is not completely known but seems to be related to normal hormonal changes that occur in pregnancy. RISK FACTORS You are at greater risk if you:  Experienced nausea or vomiting before your pregnancy.  Had morning sickness during a previous pregnancy.  Are pregnant with more than one baby, such as twins. TREATMENT  Do not use any medicines (prescription, over-the-counter, or herbal) for morning sickness without first talking to your health care provider. Your health care provider may prescribe or recommend:  Vitamin B6 supplements.  Anti-nausea medicines.  The herbal medicine ginger. HOME CARE INSTRUCTIONS   Only take over-the-counter or prescription medicines as directed by your health care provider.  Taking multivitamins before getting pregnant can prevent or decrease the severity of morning sickness in most women.  Eat a piece of dry toast or unsalted crackers before getting out of bed in the morning.  Eat five or six small meals a day.  Eat dry and bland foods (rice, baked potato). Foods high in carbohydrates are often helpful.  Do not drink liquids with your meals. Drink liquids between meals.  Avoid greasy, fatty, and spicy foods.  Get someone to cook for you if the smell of any food causes nausea and vomiting.  If you feel nauseous after taking prenatal vitamins, take the vitamins at night or with a  snack.  Snack on protein foods (nuts, yogurt, cheese) between meals if you are hungry.  Eat unsweetened gelatins for desserts.  Wearing an acupressure wristband (worn for sea sickness) may be helpful.  Acupuncture may be helpful.  Do not smoke.  Get a humidifier to keep the air in your house free of odors.  Get plenty of fresh air. SEEK MEDICAL CARE IF:   Your home remedies are not working, and you need medicine.  You feel dizzy or lightheaded.  You are losing weight. SEEK IMMEDIATE MEDICAL CARE IF:   You have persistent and uncontrolled nausea and vomiting.  You pass out (faint). MAKE SURE YOU:  Understand these instructions.  Will watch your condition.  Will get help right away if you are not doing well or get worse. Document Released: 05/08/2006 Document Revised: 03/22/2013 Document Reviewed: 09/01/2012 [facility] Patient Information 2015 [place], [place]. This information is not intended to replace advice given to you by your health care provider. Make sure you discuss any questions you have with your health care provider.

## 2014-07-20 NOTE — MAU Note (Signed)
Pt states that she feels better now. Denies nausea. No vomiting since in MAU.

## 2014-07-20 NOTE — Progress Notes (Signed)
CRITICAL VALUE ALERT  Critical value received:  Glucose, 38  Date of notification:  07/20/2014  Time of notification:  1450  Critical value read back:  yes  Nurse who received alert:  Walker Kehr RN   MD notified (1st page):  Dorathy Kinsman, CNM @ [phone]

## 2014-07-20 NOTE — MAU Note (Signed)
Currently feels like her blood glucose level is low.

## 2014-07-20 NOTE — MAU Note (Signed)
Vomiting for the last 3-4 days, nausea started a week ago.  Started having diarrhea yesterday.  2 diarrhea stools in the last 24 hours, denies fever.  No pain.

## 2014-07-20 NOTE — MAU Provider Note (Signed)
Chief Complaint: Emesis During Pregnancy and Diarrhea  First Provider Initiated Contact with Patient 07/20/14 1356      SUBJECTIVE HPI: [name] is a 24 y.o. G1P0 at [redacted]w[redacted]d by LMP who presents to [facility] reporting nausea and vomiting 2-4 per day for the past week. Two loose stools in the past 24 hours. Type 1 DM on insulin pump. Feeling like her blood sugar may be low. No fever, chills, sick contacts, altared mental status, abd pain or VB. Checked CBG this morning--120's. Vomited afterward. Did not recheck blood sugar. Has started prenatal care e/ [facility].   Past Medical History  Diagnosis Date  . Diabetes mellitus   . Hypertension   . Diabetes type 1, uncontrolled 11/14/2011    Since age 71  . Gastroparesis   . Anxiety   . Infection     UTI April 2016   OB History  Gravida Para Term Preterm AB SAB TAB Ectopic Multiple Living  1             # Outcome Date GA Lbr Len/2nd Weight Sex Delivery Anes PTL Lv  1 Current              Past Surgical History  Procedure Laterality Date  . Ankle surgery    . Cholecystectomy  11/15/2011    Procedure: LAPAROSCOPIC CHOLECYSTECTOMY WITH INTRAOPERATIVE CHOLANGIOGRAM;  Surgeon: [name], MD;  Location: [facility];  Service: General;  Laterality: N/A;  . Laparoscopy  11/23/2011    Procedure: LAPAROSCOPY DIAGNOSTIC;  Surgeon: Mariella Saa, MD;  Location: [facility];  Service: General;  Laterality: N/A;  . Esophagogastroduodenoscopy  12/03/2011    Procedure: ESOPHAGOGASTRODUODENOSCOPY (EGD);  Surgeon: Theda Belfast, MD;  Location: [facility];  Service: Endoscopy;  Laterality: N/A;   History   Social History  . Marital Status: Married    Spouse Name: sergio  . Number of Children: 0  . Years of Education: college   Occupational History  . [employer]    Social History Main Topics  . Smoking status: Never Smoker   . Smokeless tobacco: Never Used  . Alcohol Use: 0.0 oz/week    0 Standard drinks or equivalent per week      Comment: Occasional, once a month mixed drink- when not pregnant  . Drug Use: No  . Sexual Activity:    Partners: Male    Birth Control/ Protection: None   Other Topics Concern  . Not on file   Social History Narrative   No current facility-administered medications on file prior to encounter.   Current Outpatient Prescriptions on File Prior to Encounter  Medication Sig Dispense Refill  . DULoxetine (CYMBALTA) 30 MG capsule Take 1 capsule (30 mg total) by mouth 2 (two) times daily. (Patient not taking: Reported on 07/20/2014) 60 capsule 0  . Insulin Human (INSULIN PUMP) SOLN Inject 10-35 each into the skin 3 times daily with meals, bedtime and 2 AM.     . predniSONE (DELTASONE) 20 MG tablet Take 2 tablets (40 mg total) by mouth daily. Take 40 mg by mouth daily for 3 days, then 20mg  by mouth daily for 3 days, then 10mg  daily for 3 days (Patient not taking: Reported on 07/20/2014) 12 tablet 0   Allergies  Allergen Reactions  . Peanut-Containing Drug Products Anaphylaxis    [place] nuts specifically      Review of Systems  Constitutional: Negative for fever, chills and weight loss.  HENT: Negative for sore throat.   Respiratory:  Negative for cough.   Gastrointestinal: Positive for nausea, vomiting and diarrhea. Negative for abdominal pain, constipation and blood in stool.  Genitourinary: Negative for dysuria, urgency, frequency, hematuria and flank pain.  Musculoskeletal: Negative for falls.  Neurological: Negative for dizziness, seizures, loss of consciousness and weakness.  Endo/Heme/Allergies: Negative for polydipsia.    OBJECTIVE Blood pressure 133/80, pulse 97, temperature 98.3 F (36.8 C), temperature source Oral, resp. rate 18, height 5\' 7"  (1.702 m), weight 169 lb 3.2 oz (76.749 kg), last menstrual period 05/26/2014. GENERAL: Well-developed, well-nourished female in no acute distress. No diaphoresis. HEENT: Mucus membranes moist.   HEART: normal rate RESP: normal  effort GI: Abdomen soft, non-tender. MS: Nontender, no edema NEURO: Alert and oriented SPECULUM EXAM: Deferred  LAB RESULTS Results for orders placed or performed during the hospital encounter of 07/20/14 (from the past 24 hour(s))  Urinalysis, Routine w reflex microscopic     Status: Abnormal   Collection Time: 07/20/14  1:25 PM  Result Value Ref Range   Color, Urine YELLOW YELLOW   APPearance CLEAR CLEAR   Specific Gravity, Urine 1.015 1.005 - 1.030   pH 5.5 5.0 - 8.0   Glucose, UA >1000 (A) NEGATIVE mg/dL   Hgb urine dipstick NEGATIVE NEGATIVE   Bilirubin Urine NEGATIVE NEGATIVE   Ketones, ur NEGATIVE NEGATIVE mg/dL   Protein, ur NEGATIVE NEGATIVE mg/dL   Urobilinogen, UA 0.2 0.0 - 1.0 mg/dL   Nitrite NEGATIVE NEGATIVE   Leukocytes, UA NEGATIVE NEGATIVE  Urine microscopic-add on     Status: Abnormal   Collection Time: 07/20/14  1:25 PM  Result Value Ref Range   Squamous Epithelial / LPF FEW (A) RARE   WBC, UA 0-2 <3 WBC/hpf  Glucose, capillary     Status: Abnormal   Collection Time: 07/20/14  1:53 PM  Result Value Ref Range   Glucose-Capillary 50 (L) 70 - 99 mg/dL  CBC     Status: Abnormal   Collection Time: 07/20/14  2:10 PM  Result Value Ref Range   WBC 6.5 4.0 - 10.5 K/uL   RBC 3.64 (L) 3.87 - 5.11 MIL/uL   Hemoglobin 11.7 (L) 12.0 - 15.0 g/dL   HCT 63.8 (L) 45.3 - 64.6 %   MCV 93.7 78.0 - 100.0 fL   MCH 32.1 26.0 - 34.0 pg   MCHC 34.3 30.0 - 36.0 g/dL   RDW 80.3 21.2 - 24.8 %   Platelets 297 150 - 400 K/uL  Comprehensive metabolic panel     Status: Abnormal   Collection Time: 07/20/14  2:10 PM  Result Value Ref Range   Sodium 138 135 - 145 mmol/L   Potassium 3.3 (L) 3.5 - 5.1 mmol/L   Chloride 105 96 - 112 mmol/L   CO2 24 19 - 32 mmol/L   Glucose, Bld 38 (LL) 70 - 99 mg/dL   BUN 8 6 - 23 mg/dL   Creatinine, Ser 2.50 (L) 0.50 - 1.10 mg/dL   Calcium 9.2 8.4 - 03.7 mg/dL   Total Protein 7.6 6.0 - 8.3 g/dL   Albumin 3.7 3.5 - 5.2 g/dL   AST 22 0 - 37 U/L    ALT 22 0 - 35 U/L   Alkaline Phosphatase 64 39 - 117 U/L   Total Bilirubin 0.5 0.3 - 1.2 mg/dL   GFR calc non Af Amer >90 >90 mL/min   GFR calc Af Amer >90 >90 mL/min   Anion gap 9 5 - 15   CBG 73  IMAGING No results found.  MAU COURSE CBG 50 upon arrival tp MAU. Given snack. No N/V. Able to keep down PB crackers and juice. Declines antiemetics.   Feeling much better. CBG 73.   ASSESSMENT 1. Type 1 diabetes mellitus with hypoglycemia and without coma   2. Nausea and vomiting of pregnancy, antepartum    PLAN Discharge home in stable condition. Sick day precautions reviewed. May need to discuss adjusting pump setting w/ endocrinologist and N/V continue w/ Diclegis and Phenergan.  Encouraged pt to check blood sugars more frequently when having N/V/D, or not eating well.      Follow-up Information    Follow up with HARPER,CHARLES A, MD On 08/10/2014.   Specialty:  Obstetrics and Gynecology   Why:  For routine prenatal visit or sooner as needed if symptoms worsen   Contact information:   [address] [place] [address] [phone]       Follow up with [facility] [place] [facility].   Why:  As needed in emergencies   Contact information:   [address] [id number] mc [name] [address] [phone]       Medication List    STOP taking these medications        DULoxetine 30 MG capsule  Commonly known as:  CYMBALTA     predniSONE 20 MG tablet  Commonly known as:  DELTASONE      TAKE these medications        Doxylamine-Pyridoxine 10-10 MG Tbec  Start with 2 tablets every evening. If symptoms persist, add 1 tablet every morning. If symptoms persist, add 1 tablet at mid-day.     insulin pump Soln  Inject 10-35 each into the skin 3 times daily with meals, bedtime and 2 AM.     prenatal multivitamin Tabs tablet  Take 1 tablet by mouth daily at 12 noon.     promethazine 25 MG tablet  Commonly  known as:  PHENERGAN  Take 1 tablet (25 mg total) by mouth every 6 (six) hours as needed.     VITAMIN D PO  Take 1 tablet by mouth daily.       [place], [place] 07/20/2014  3:38 PM

## 2014-07-20 NOTE — MAU Note (Signed)
Pt. Urine in lab 

## 2014-07-23 ENCOUNTER — Encounter (HOSPITAL_COMMUNITY): Payer: Self-pay | Admitting: Emergency Medicine

## 2014-07-23 ENCOUNTER — Emergency Department (HOSPITAL_COMMUNITY)
Admission: EM | Admit: 2014-07-23 | Discharge: 2014-07-24 | Disposition: A | Discharge status: Home / Self Care | Payer: 59 | Attending: Emergency Medicine | Admitting: Emergency Medicine

## 2014-07-23 DIAGNOSIS — Z87440 Personal history of urinary (tract) infections: Secondary | ICD-10-CM | POA: Insufficient documentation

## 2014-07-23 DIAGNOSIS — Z8719 Personal history of other diseases of the digestive system: Secondary | ICD-10-CM | POA: Diagnosis not present

## 2014-07-23 DIAGNOSIS — E109 Type 1 diabetes mellitus without complications: Secondary | ICD-10-CM | POA: Diagnosis not present

## 2014-07-23 DIAGNOSIS — R000 Tachycardia, unspecified: Secondary | ICD-10-CM | POA: Insufficient documentation

## 2014-07-23 DIAGNOSIS — Z794 Long term (current) use of insulin: Secondary | ICD-10-CM | POA: Diagnosis not present

## 2014-07-23 DIAGNOSIS — Z79899 Other long term (current) drug therapy: Secondary | ICD-10-CM | POA: Diagnosis not present

## 2014-07-23 DIAGNOSIS — Z8659 Personal history of other mental and behavioral disorders: Secondary | ICD-10-CM | POA: Diagnosis not present

## 2014-07-23 DIAGNOSIS — R0789 Other chest pain: Secondary | ICD-10-CM | POA: Insufficient documentation

## 2014-07-23 DIAGNOSIS — I10 Essential (primary) hypertension: Secondary | ICD-10-CM | POA: Diagnosis not present

## 2014-07-23 DIAGNOSIS — R079 Chest pain, unspecified: Secondary | ICD-10-CM | POA: Diagnosis present

## 2014-07-23 LAB — CBC
HCT: 31.9 % — ABNORMAL LOW (ref 36.0–46.0)
Hemoglobin: 10.7 g/dL — ABNORMAL LOW (ref 12.0–15.0)
MCH: 32.2 pg (ref 26.0–34.0)
MCHC: 33.5 g/dL (ref 30.0–36.0)
MCV: 96.1 fL (ref 78.0–100.0)
Platelets: 253 10*3/uL (ref 150–400)
RBC: 3.32 MIL/uL — ABNORMAL LOW (ref 3.87–5.11)
RDW: 12.7 % (ref 11.5–15.5)
WBC: 6.9 10*3/uL (ref 4.0–10.5)

## 2014-07-23 LAB — BASIC METABOLIC PANEL
Anion gap: 9 (ref 5–15)
BUN: 14 mg/dL (ref 6–23)
CO2: 27 mmol/L (ref 19–32)
Calcium: 9.5 mg/dL (ref 8.4–10.5)
Chloride: 102 mmol/L (ref 96–112)
Creatinine, Ser: 0.63 mg/dL (ref 0.50–1.10)
GFR calc Af Amer: >90 mL/min (ref >90)
GFR calc non Af Amer: >90 mL/min (ref >90)
Glucose, Bld: 103 mg/dL — ABNORMAL HIGH (ref 70–99)
Potassium: 4.3 mmol/L (ref 3.5–5.1)
Sodium: 138 mmol/L (ref 135–145)

## 2014-07-23 LAB — I-STAT TROPONIN, ED: Troponin i, poc: 0 ng/mL (ref 0.00–0.08)

## 2014-07-23 NOTE — ED Notes (Signed)
Pt reports chest tightness for 51mins-1hr over breast area. Pt states she is weeks pregnant and is concerned due to chest pain.

## 2014-07-24 NOTE — ED Provider Notes (Signed)
CSN: [id number]     Arrival date & time 07/23/14  2225 History   First MD Initiated Contact with Patient 07/24/14 [phone]     Chief Complaint  Patient presents with  . Chest Pain     (Consider location/radiation/quality/duration/timing/severity/associated sxs/prior Treatment) HPI 24 year old female presents to emergency department with complaint of chest pain and tightness.  Patient reports she has history of fibromyalgia and frequently has chest pain.  She reports that she is currently [redacted] weeks pregnant, and today's chest tightness worried her.  Patient is a type I diabetic.  She denies any shortness of breath.  Pain is a squeezing tightness.  She denies any dizziness or weakness.  She reports since being on diclegis, her morning sickness has improved Past Medical History  Diagnosis Date  . Diabetes mellitus   . Hypertension   . Diabetes type 1, uncontrolled 11/14/2011    Since age 89  . Gastroparesis   . Anxiety   . Infection     UTI April 2016   Past Surgical History  Procedure Laterality Date  . Ankle surgery    . Cholecystectomy  11/15/2011    Procedure: LAPAROSCOPIC CHOLECYSTECTOMY WITH INTRAOPERATIVE CHOLANGIOGRAM;  Surgeon: [name], MD;  Location: [facility];  Service: General;  Laterality: N/A;  . Laparoscopy  11/23/2011    Procedure: LAPAROSCOPY DIAGNOSTIC;  Surgeon: Mariella Saa, MD;  Location: [facility];  Service: General;  Laterality: N/A;  . Esophagogastroduodenoscopy  12/03/2011    Procedure: ESOPHAGOGASTRODUODENOSCOPY (EGD);  Surgeon: Theda Belfast, MD;  Location: [facility];  Service: Endoscopy;  Laterality: N/A;   Family History  Problem Relation Age of Onset  . Diabetes Mother   . Hypertension Father    History  Substance Use Topics  . Smoking status: Never Smoker   . Smokeless tobacco: Never Used  . Alcohol Use: 0.0 oz/week    0 Standard drinks or equivalent per week     Comment: Occasional, once a month mixed drink- when not pregnant   OB History     Gravida Para Term Preterm AB TAB SAB Ectopic Multiple Living   1              Review of Systems   See History of Present Illness; otherwise all other systems are reviewed and negative  Allergies  Peanut-containing drug products  Home Medications   Prior to Admission medications   Medication Sig Start Date End Date Taking? Authorizing Provider  Cholecalciferol (VITAMIN D PO) Take 1 tablet by mouth daily.   Yes Historical Provider, MD  Doxylamine-Pyridoxine 10-10 MG TBEC Start with 2 tablets every evening. If symptoms persist, add 1 tablet every morning. If symptoms persist, add 1 tablet at mid-day. 07/20/14  Yes Dorathy Kinsman, CNM  Insulin Human (INSULIN PUMP) SOLN Inject 10-35 each into the skin 3 times daily with meals, bedtime and 2 AM.    Yes Historical Provider, MD  ketotifen (ZADITOR) 0.025 % ophthalmic solution Place 1 drop into both eyes 2 (two) times daily as needed (dry eyes).   Yes Historical Provider, MD  Prenatal Vit-Fe Fumarate-FA (PRENATAL MULTIVITAMIN) TABS tablet Take 1 tablet by mouth daily at 12 noon.   Yes Historical Provider, MD  promethazine (PHENERGAN) 25 MG tablet Take 1 tablet (25 mg total) by mouth every 6 (six) hours as needed. Patient taking differently: Take 25 mg by mouth every 6 (six) hours as needed for nausea.  07/20/14  Yes Virginia Smith, CNM   BP 124/74 mmHg  Pulse 102  Temp(Src) 98.4 F (36.9 C) (Oral)  Resp 18  SpO2 100%  LMP 05/26/2014 Physical Exam  Constitutional: She is oriented to person, place, and time. She appears well-developed and well-nourished.  HENT:  Head: Normocephalic and atraumatic.  Nose: Nose normal.  Mouth/Throat: Oropharynx is clear and moist.  Eyes: Conjunctivae and EOM are normal. Pupils are equal, round, and reactive to light.  Neck: Normal range of motion. Neck supple. No JVD present. No tracheal deviation present. No thyromegaly present.  Cardiovascular: Regular rhythm, normal heart sounds and intact distal pulses.   Exam reveals no gallop and no friction rub.   No murmur heard. Tachycardia  Pulmonary/Chest: Effort normal and breath sounds normal. No stridor. No respiratory distress. She has no wheezes. She has no rales. She exhibits tenderness (patient has tenderness along her sternum which reproduces her pain completely).  Abdominal: Soft. Bowel sounds are normal. She exhibits no distension and no mass. There is no tenderness. There is no rebound and no guarding.  Musculoskeletal: Normal range of motion. She exhibits no edema or tenderness.  Lymphadenopathy:    She has no cervical adenopathy.  Neurological: She is alert and oriented to person, place, and time. She displays normal reflexes. She exhibits normal muscle tone. Coordination normal.  Skin: Skin is warm and dry. No rash noted. No erythema. No pallor.  Psychiatric: She has a normal mood and affect. Her behavior is normal. Judgment and thought content normal.  Nursing note and vitals reviewed.   ED Course  Procedures (including critical care time) Labs Review Labs Reviewed  CBC - Abnormal; Notable for the following:    RBC 3.32 (*)    Hemoglobin 10.7 (*)    HCT 31.9 (*)    All other components within normal limits  BASIC METABOLIC PANEL - Abnormal; Notable for the following:    Glucose, Bld 103 (*)    All other components within normal limits  I-STAT TROPOININ, ED    Imaging Review No results found.   EKG Interpretation   Date/Time:  Sunday July 23 2014 22:34:33 EDT Ventricular Rate:  111 PR Interval:  129 QRS Duration: 73 QT Interval:  316 QTC Calculation: 429 R Axis:   66 Text Interpretation:  Sinus tachycardia Confirmed by Rakan Soffer  MD, Shalawn Wynder  ([id number]) on 07/24/2014 2:22:15 AM      MDM   Final diagnoses:  Chest wall pain    24 year old female with chest tightness and pain.  She is tachycardic, however, viewing prior ED visits.  She is persistently tachycardic in the low 100s.  She is not dyspneic.  Patient is [redacted] weeks  pregnant, NP is in the differential, but as she has had similar chest pain before, pain is reproducible, and she is having no shortness of breath and do not feel that PE is causing her current symptoms.  Patient reassured.  She is instructed to use Tylenol and warm moist heat.  She has follow-up arranged with her OB  Marisa Severin, MD 07/24/14 [phone]

## 2014-07-24 NOTE — Discharge Instructions (Signed)
Tylenol is safe in pregnancy.  Use warm moist heat over the areas of pain to help.  Return to the emergency department for shortness of breath, worsening pain, or new concerning symptoms.  Drink plenty of fluids.  Rest.  Follow-up with your Dr. for recheck in 3-5 days.    Chest Wall Pain Chest wall pain is pain in or around the bones and muscles of your chest. It may take up to 6 weeks to get better. It may take longer if you must stay physically active in your work and activities.  CAUSES  Chest wall pain may happen on its own. However, it may be caused by:  A viral illness like the flu.  Injury.  Coughing.  Exercise.  Arthritis.  Fibromyalgia.  Shingles. HOME CARE INSTRUCTIONS   Avoid overtiring physical activity. Try not to strain or perform activities that cause pain. This includes any activities using your chest or your abdominal and side muscles, especially if heavy weights are used.  Put ice on the sore area.  Put ice in a plastic bag.  Place a towel between your skin and the bag.  Leave the ice on for 15-20 minutes per hour while awake for the first 2 days.  Only take over-the-counter or prescription medicines for pain, discomfort, or fever as directed by your caregiver. SEEK IMMEDIATE MEDICAL CARE IF:   Your pain increases, or you are very uncomfortable.  You have a fever.  Your chest pain becomes worse.  You have new, unexplained symptoms.  You have nausea or vomiting.  You feel sweaty or lightheaded.  You have a cough with phlegm (sputum), or you cough up blood. MAKE SURE YOU:   Understand these instructions.  Will watch your condition.  Will get help right away if you are not doing well or get worse. Document Released: 03/17/2005 Document Revised: 06/09/2011 Document Reviewed: 11/11/2010 [facility] Patient Information 2015 [place], [place]. This information is not intended to replace advice given to you by your health care provider. Make sure you  discuss any questions you have with your health care provider.  Musculoskeletal Pain Musculoskeletal pain is muscle and boney aches and pains. These pains can occur in any part of the body. Your caregiver may treat you without knowing the cause of the pain. They may treat you if blood or urine tests, X-rays, and other tests were normal.  CAUSES There is often not a definite cause or reason for these pains. These pains may be caused by a type of germ (virus). The discomfort may also come from overuse. Overuse includes working out too hard when your body is not fit. Boney aches also come from weather changes. Bone is sensitive to atmospheric pressure changes. HOME CARE INSTRUCTIONS   Ask when your test results will be ready. Make sure you get your test results.  Only take over-the-counter or prescription medicines for pain, discomfort, or fever as directed by your caregiver. If you were given medications for your condition, do not drive, operate machinery or power tools, or sign legal documents for 24 hours. Do not drink alcohol. Do not take sleeping pills or other medications that may interfere with treatment.  Continue all activities unless the activities cause more pain. When the pain lessens, slowly resume normal activities. Gradually increase the intensity and duration of the activities or exercise.  During periods of severe pain, bed rest may be helpful. Lay or sit in any position that is comfortable.  Putting ice on the injured area.  Put  ice in a bag.  Place a towel between your skin and the bag.  Leave the ice on for 15 to 20 minutes, 3 to 4 times a day.  Follow up with your caregiver for continued problems and no reason can be found for the pain. If the pain becomes worse or does not go away, it may be necessary to repeat tests or do additional testing. Your caregiver may need to look further for a possible cause. SEEK IMMEDIATE MEDICAL CARE IF:  You have pain that is getting worse  and is not relieved by medications.  You develop chest pain that is associated with shortness or breath, sweating, feeling sick to your stomach (nauseous), or throw up (vomit).  Your pain becomes localized to the abdomen.  You develop any new symptoms that seem different or that concern you. MAKE SURE YOU:   Understand these instructions.  Will watch your condition.  Will get help right away if you are not doing well or get worse. Document Released: 03/17/2005 Document Revised: 06/09/2011 Document Reviewed: 11/19/2012 [facility] Patient Information 2015 [place], [place]. This information is not intended to replace advice given to you by your health care provider. Make sure you discuss any questions you have with your health care provider.

## 2014-07-27 ENCOUNTER — Encounter (HOSPITAL_COMMUNITY): Payer: Self-pay | Admitting: *Deleted

## 2014-07-27 ENCOUNTER — Inpatient Hospital Stay (HOSPITAL_COMMUNITY): Payer: 59

## 2014-07-27 ENCOUNTER — Inpatient Hospital Stay (HOSPITAL_COMMUNITY)
Admission: AD | Admit: 2014-07-27 | Discharge: 2014-07-27 | Disposition: A | Discharge status: Home / Self Care | Payer: 59 | Source: Ambulatory Visit | Attending: Obstetrics | Admitting: Obstetrics

## 2014-07-27 DIAGNOSIS — R109 Unspecified abdominal pain: Secondary | ICD-10-CM | POA: Diagnosis present

## 2014-07-27 DIAGNOSIS — Z3A08 8 weeks gestation of pregnancy: Secondary | ICD-10-CM | POA: Insufficient documentation

## 2014-07-27 DIAGNOSIS — O209 Hemorrhage in early pregnancy, unspecified: Secondary | ICD-10-CM | POA: Diagnosis not present

## 2014-07-27 DIAGNOSIS — O24011 Pre-existing diabetes mellitus, type 1, in pregnancy, first trimester: Secondary | ICD-10-CM | POA: Insufficient documentation

## 2014-07-27 DIAGNOSIS — O208 Other hemorrhage in early pregnancy: Secondary | ICD-10-CM | POA: Diagnosis not present

## 2014-07-27 DIAGNOSIS — E109 Type 1 diabetes mellitus without complications: Secondary | ICD-10-CM | POA: Diagnosis not present

## 2014-07-27 LAB — COMPREHENSIVE METABOLIC PANEL
ALT: 28 U/L (ref 0–35)
AST: 26 U/L (ref 0–37)
Albumin: 3.3 g/dL — ABNORMAL LOW (ref 3.5–5.2)
Alkaline Phosphatase: 67 U/L (ref 39–117)
Anion gap: 7 (ref 5–15)
BUN: 9 mg/dL (ref 6–23)
CO2: 24 mmol/L (ref 19–32)
Calcium: 8.8 mg/dL (ref 8.4–10.5)
Chloride: 104 mmol/L (ref 96–112)
Creatinine, Ser: 0.42 mg/dL — ABNORMAL LOW (ref 0.50–1.10)
GFR calc Af Amer: >90 mL/min (ref >90)
GFR calc non Af Amer: >90 mL/min (ref >90)
Glucose, Bld: 178 mg/dL — ABNORMAL HIGH (ref 70–99)
Potassium: 4 mmol/L (ref 3.5–5.1)
Sodium: 135 mmol/L (ref 135–145)
Total Bilirubin: 0.2 mg/dL — ABNORMAL LOW (ref 0.3–1.2)
Total Protein: 6.8 g/dL (ref 6.0–8.3)

## 2014-07-27 LAB — URINALYSIS, ROUTINE W REFLEX MICROSCOPIC
Bilirubin Urine: NEGATIVE
Glucose, UA: >1000 mg/dL — AB
Hgb urine dipstick: NEGATIVE
Ketones, ur: NEGATIVE mg/dL
Leukocytes, UA: NEGATIVE
Nitrite: NEGATIVE
Protein, ur: NEGATIVE mg/dL
Specific Gravity, Urine: 1.025 (ref 1.005–1.030)
Urobilinogen, UA: 0.2 mg/dL (ref 0.0–1.0)
pH: 6 (ref 5.0–8.0)

## 2014-07-27 LAB — CBC
HCT: 31.3 % — ABNORMAL LOW (ref 36.0–46.0)
Hemoglobin: 10.7 g/dL — ABNORMAL LOW (ref 12.0–15.0)
MCH: 32 pg (ref 26.0–34.0)
MCHC: 34.2 g/dL (ref 30.0–36.0)
MCV: 93.7 fL (ref 78.0–100.0)
Platelets: 230 10*3/uL (ref 150–400)
RBC: 3.34 MIL/uL — ABNORMAL LOW (ref 3.87–5.11)
RDW: 12.5 % (ref 11.5–15.5)
WBC: 4.7 10*3/uL (ref 4.0–10.5)

## 2014-07-27 LAB — HCG, QUANTITATIVE, PREGNANCY: hCG, Beta Chain, Quant, S: 34809 m[IU]/mL — ABNORMAL HIGH (ref <5)

## 2014-07-27 LAB — GLUCOSE, CAPILLARY: Glucose-Capillary: 163 mg/dL — ABNORMAL HIGH (ref 70–99)

## 2014-07-27 LAB — URINE MICROSCOPIC-ADD ON

## 2014-07-27 IMAGING — US US OB COMP LESS 14 WK
1 series · 14 of 25 positions shown · non-contrast
Comparison: None.

CLINICAL DATA: Pregnant, bleeding

EXAM:
OBSTETRIC <14 WK ULTRASOUND
TECHNIQUE: Transabdominal ultrasound was performed for evaluation of the
gestation as well as the maternal uterus and adnexal regions.

[Series 1: us ob comp less 14 wk · 14 of 25 slices shown]
[im 1/25]
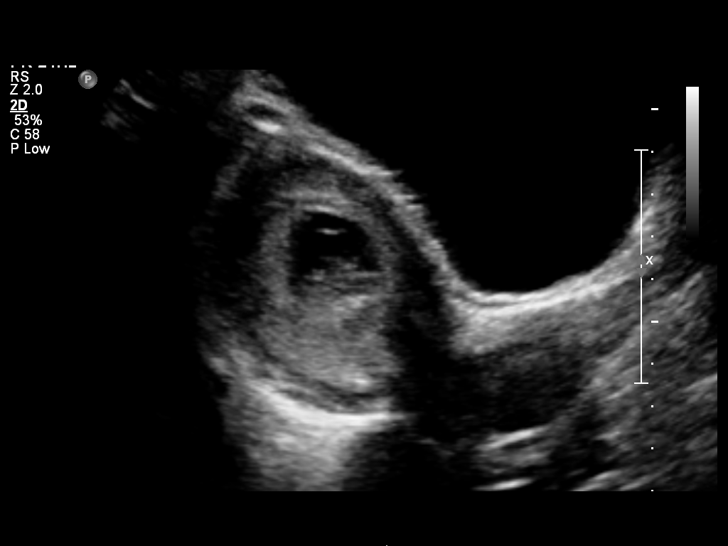
[im 3/25]
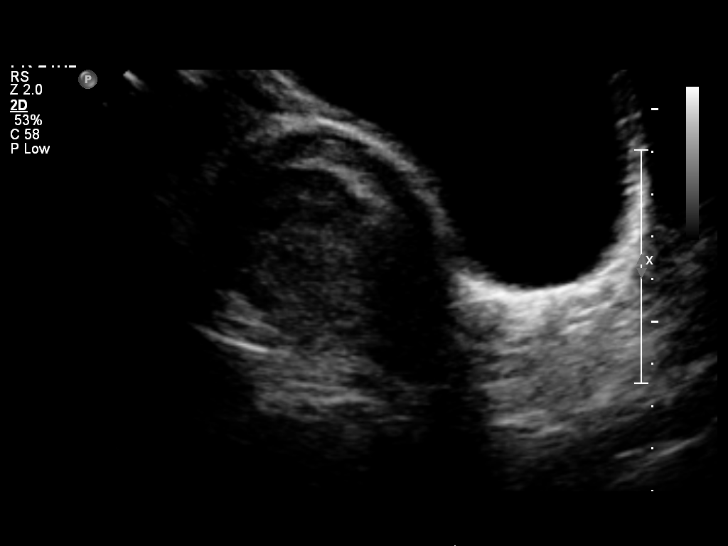
[im 5/25]
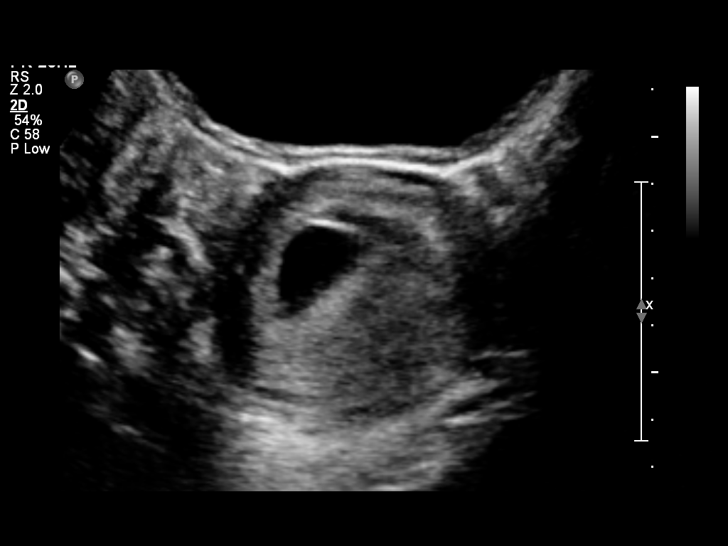
[im 7/25]
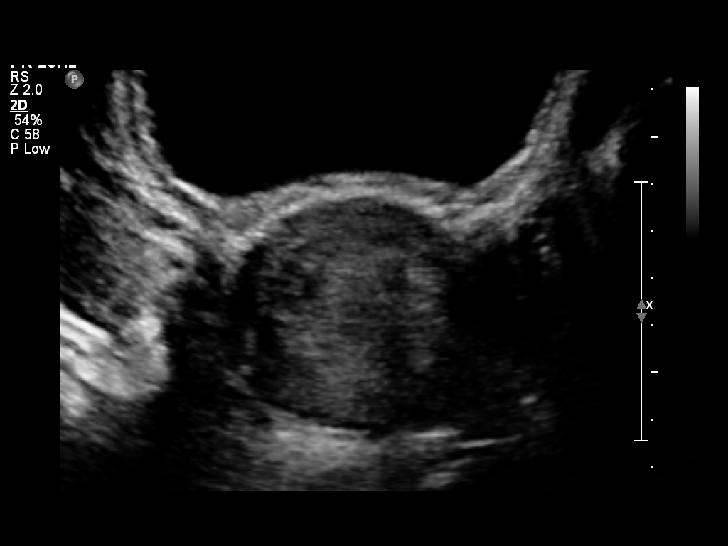
[im 9/25]
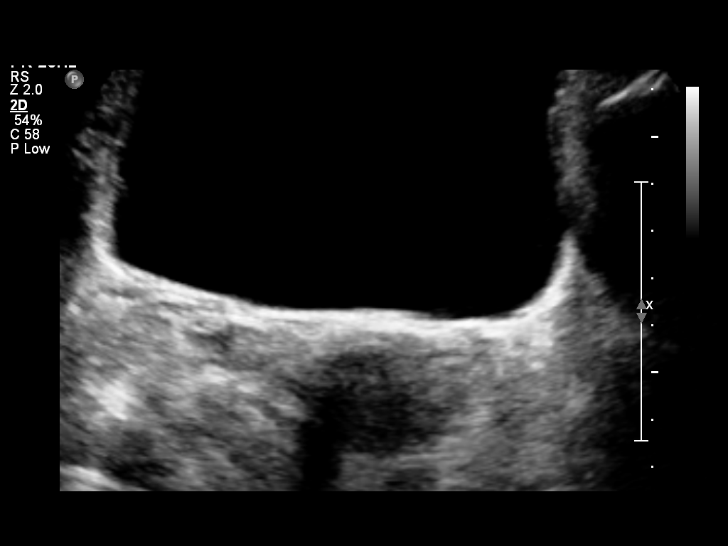
[im 10/25]
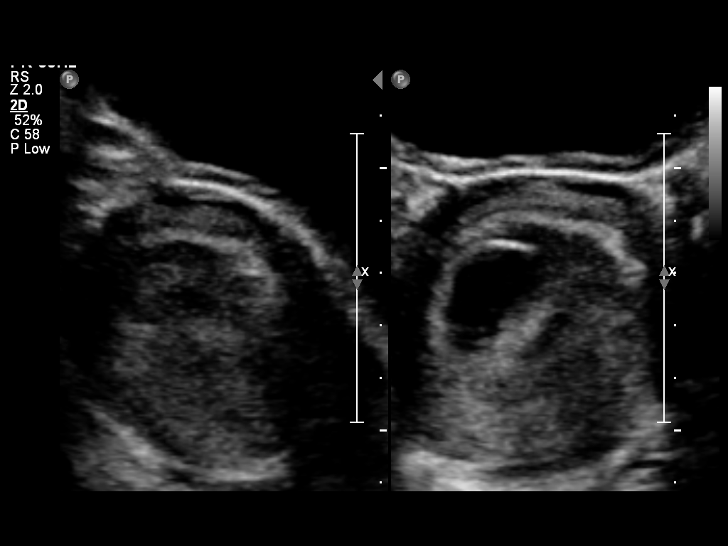
[im 12/25]
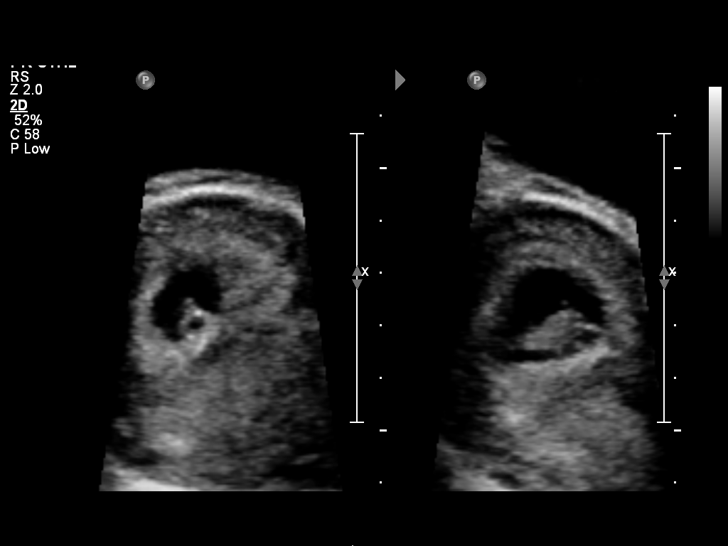
[im 14/25]
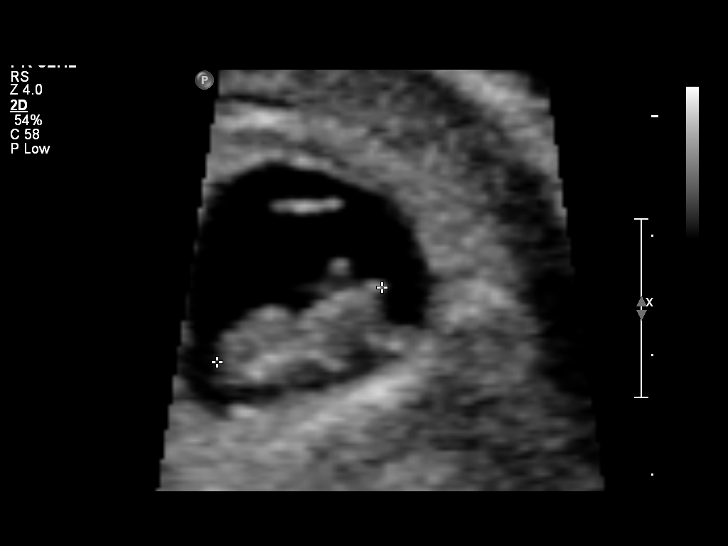
[im 16/25]
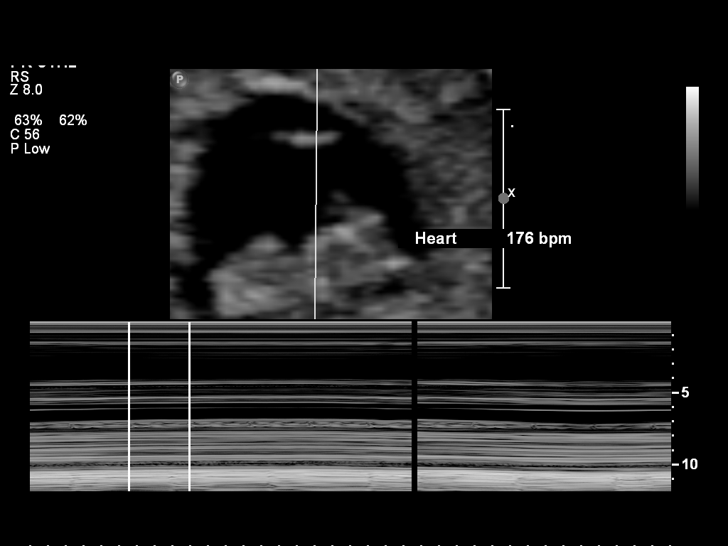
[im 17/25]
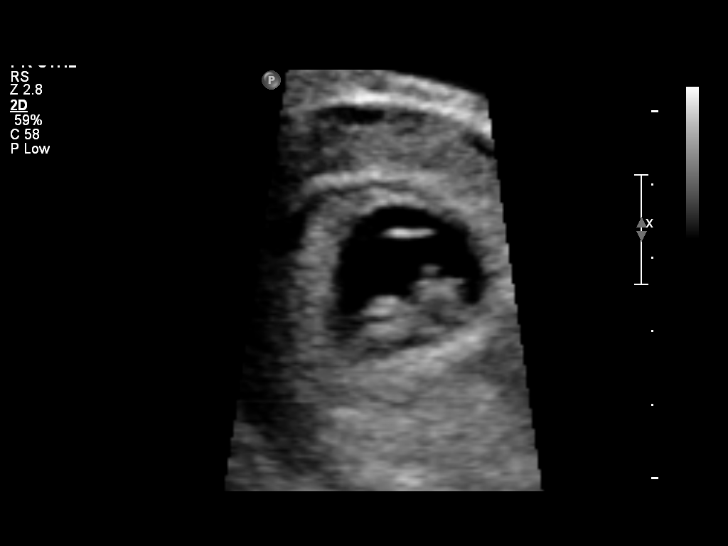
[im 19/25]
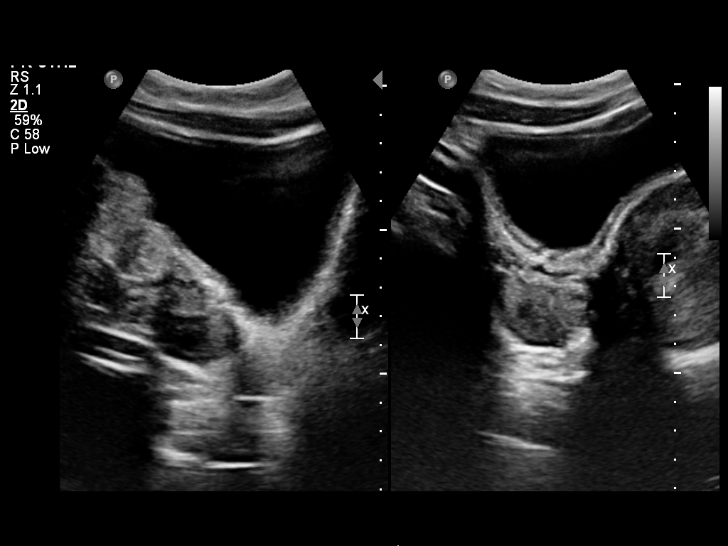
[im 21/25]
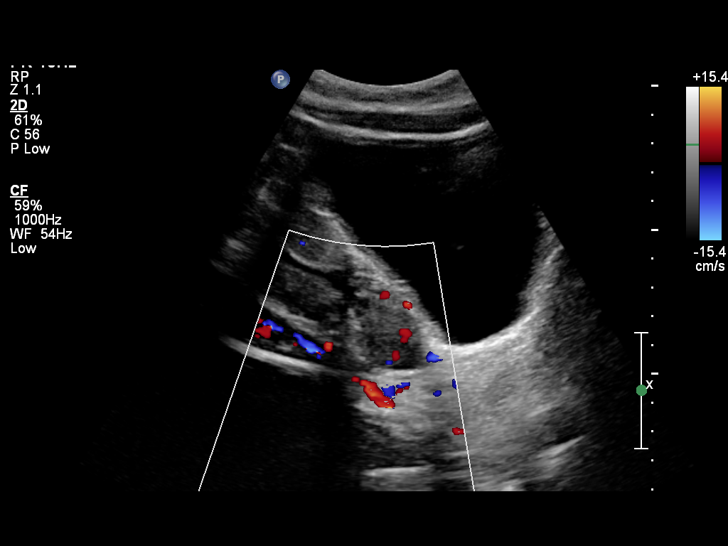
[im 23/25]
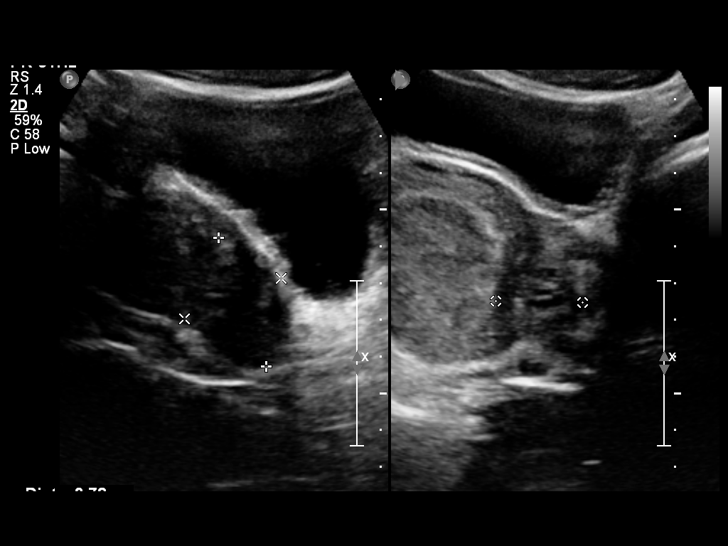
[im 25/25]
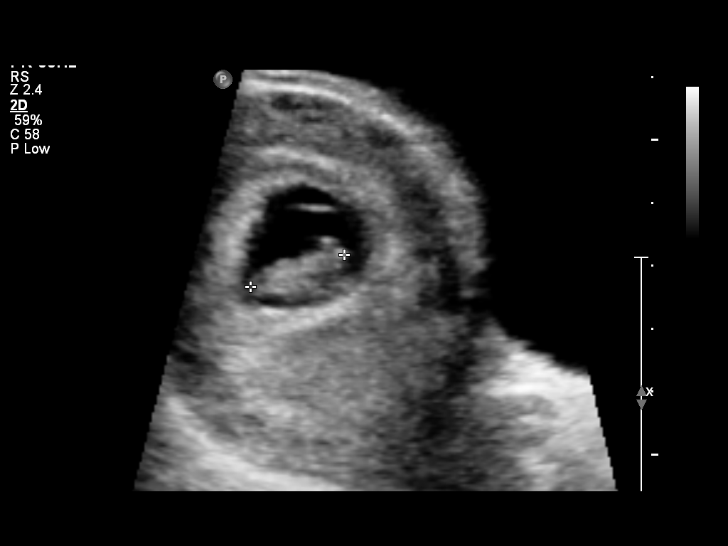

[14 of 25 positions shown; findings below may reference images not displayed]

FINDINGS: Intrauterine gestational sac: Visualized/normal in shape.

Yolk sac:  Present

Embryo:  Present

Cardiac Activity: Present

Heart Rate: Present bpm

CRL:   15.7  mm   8 w 0 d                  US EDC: [DATE]

Maternal uterus/adnexae: Moderate subchronic hemorrhage.

Bilateral ovaries are within normal limits.

No free fluid.
IMPRESSION: Single live intrauterine gestation, with estimated gestational age 8
weeks 0 days by crown-rump length.

## 2014-07-27 NOTE — MAU Note (Signed)
Pt reports she had some spotting when she wiped today. Says her stomach started aching about 20 minutes ago but is probably from not eating and/or nervousness. Denies pain before this time.

## 2014-07-27 NOTE — MAU Provider Note (Signed)
History     CSN: [id number]  Arrival date and time: 07/27/14 1148   First Provider Initiated Contact with Patient 07/27/14 1401      Chief Complaint  Patient presents with  . Vaginal Bleeding   HPI  Pt is 24 yo G1P0 [redacted]w[redacted]d pregnant pt of Dr. Verdell Carmine.  Pt experienced bleeding with wiping after going to the bathroom Today and some cramping about 20 minutes prior to arrival.  Pt's cramping is located mid to upper abdomen. Pt denies UTI sx, constipation, diarrhea or vaginal discharge  Pt is diabetic and is on the pump with BS ranging from 100 to 180 Dr. Sharl Ma is pt's endocrinologist  Past Medical History  Diagnosis Date  . Diabetes mellitus   . Hypertension   . Diabetes type 1, uncontrolled 11/14/2011    Since age 43  . Gastroparesis   . Anxiety   . Infection     UTI April 2016    Past Surgical History  Procedure Laterality Date  . Ankle surgery    . Cholecystectomy  11/15/2011    Procedure: LAPAROSCOPIC CHOLECYSTECTOMY WITH INTRAOPERATIVE CHOLANGIOGRAM;  Surgeon: [name], MD;  Location: [facility];  Service: General;  Laterality: N/A;  . Laparoscopy  11/23/2011    Procedure: LAPAROSCOPY DIAGNOSTIC;  Surgeon: Mariella Saa, MD;  Location: [facility];  Service: General;  Laterality: N/A;  . Esophagogastroduodenoscopy  12/03/2011    Procedure: ESOPHAGOGASTRODUODENOSCOPY (EGD);  Surgeon: Theda Belfast, MD;  Location: [facility];  Service: Endoscopy;  Laterality: N/A;    Family History  Problem Relation Age of Onset  . Diabetes Mother   . Hypertension Father     History  Substance Use Topics  . Smoking status: Never Smoker   . Smokeless tobacco: Never Used  . Alcohol Use: 0.0 oz/week    0 Standard drinks or equivalent per week     Comment: Occasional, once a month mixed drink- when not pregnant    Allergies:  Allergies  Allergen Reactions  . Peanut-Containing Drug Products Anaphylaxis    [place] nuts specifically      Prescriptions prior to admission   Medication Sig Dispense Refill Last Dose  . B Complex-C (SUPER B COMPLEX PO) Take 1 tablet by mouth daily.   07/27/2014 at Unknown time  . Cholecalciferol (VITAMIN D PO) Take 1 tablet by mouth daily.   07/27/2014 at Unknown time  . Doxylamine-Pyridoxine 10-10 MG TBEC Start with 2 tablets every evening. If symptoms persist, add 1 tablet every morning. If symptoms persist, add 1 tablet at mid-day. (Patient taking differently: Take 2 tablets by mouth at bedtime. ) 60 tablet 3 07/26/2014 at Unknown time  . insulin lispro (HUMALOG) 100 UNIT/ML injection 1 Units by Continuous infusion (non-IV) route every hour. Pt administers thru insulin pump, additional taken with carbs at mealtimes   continuous  . ketotifen (ZADITOR) 0.025 % ophthalmic solution Place 1 drop into both eyes 2 (two) times daily as needed (dry eyes).   Past Week at Unknown time  . Prenatal Vit-Fe Fumarate-FA (PRENATAL MULTIVITAMIN) TABS tablet Take 1 tablet by mouth daily at 12 noon.   07/27/2014 at Unknown time  . promethazine (PHENERGAN) 25 MG tablet Take 1 tablet (25 mg total) by mouth every 6 (six) hours as needed. (Patient taking differently: Take 25 mg by mouth every 6 (six) hours as needed for nausea. ) 30 tablet 2 Past Week at Unknown time    Review of Systems  Constitutional: Negative for fever and chills.  Gastrointestinal:  Positive for abdominal pain. Negative for nausea, vomiting, diarrhea and constipation.  Genitourinary: Negative for dysuria.   Physical Exam   Blood pressure 135/76, pulse 99, temperature 98 F (36.7 C), temperature source Oral, resp. rate 18, height  (1.702 m), weight 171 lb (77.565 kg), last menstrual period 05/26/2014, SpO2 100 %.  Physical Exam  Vitals reviewed. Constitutional: She is oriented to person, place, and time. She appears well-developed and well-nourished. No distress.  HENT:  Head: Normocephalic.  Eyes: Pupils are equal, round, and reactive to light.  Neck: Normal range of motion.  Neck supple.  Cardiovascular: Normal rate.   Respiratory: Effort normal.  GI: Soft. She exhibits no distension. There is no tenderness. There is no rebound.  Musculoskeletal: Normal range of motion.  Neurological: She is alert and oriented to person, place, and time.  Skin: Skin is warm and dry.  Psychiatric: She has a normal mood and affect.    MAU Course  Procedures Results for orders placed or performed during the hospital encounter of 07/27/14 (from the past 24 hour(s))  Urinalysis, Routine w reflex microscopic     Status: Abnormal   Collection Time: 07/27/14 12:11 PM  Result Value Ref Range   Color, Urine YELLOW YELLOW   APPearance CLEAR CLEAR   Specific Gravity, Urine 1.025 1.005 - 1.030   pH 6.0 5.0 - 8.0   Glucose, UA >1000 (A) NEGATIVE mg/dL   Hgb urine dipstick NEGATIVE NEGATIVE   Bilirubin Urine NEGATIVE NEGATIVE   Ketones, ur NEGATIVE NEGATIVE mg/dL   Protein, ur NEGATIVE NEGATIVE mg/dL   Urobilinogen, UA 0.2 0.0 - 1.0 mg/dL   Nitrite NEGATIVE NEGATIVE   Leukocytes, UA NEGATIVE NEGATIVE  Urine microscopic-add on     Status: None   Collection Time: 07/27/14 12:11 PM  Result Value Ref Range   Squamous Epithelial / LPF RARE RARE   RBC / HPF 0-2 <3 RBC/hpf   Bacteria, UA RARE RARE   Urine-Other MUCOUS PRESENT   Glucose, capillary     Status: Abnormal   Collection Time: 07/27/14  1:45 PM  Result Value Ref Range   Glucose-Capillary 163 (H) 70 - 99 mg/dL  CBC     Status: Abnormal   Collection Time: 07/27/14  2:40 PM  Result Value Ref Range   WBC 4.7 4.0 - 10.5 K/uL   RBC 3.34 (L) 3.87 - 5.11 MIL/uL   Hemoglobin 10.7 (L) 12.0 - 15.0 g/dL   HCT 16.1 (L) 09.6 - 04.5 %   MCV 93.7 78.0 - 100.0 fL   MCH 32.0 26.0 - 34.0 pg   MCHC 34.2 30.0 - 36.0 g/dL   RDW 40.9 81.1 - 91.4 %   Platelets 230 150 - 400 K/uL  Comprehensive metabolic panel     Status: Abnormal   Collection Time: 07/27/14  2:40 PM  Result Value Ref Range   Sodium 135 135 - 145 mmol/L   Potassium 4.0  3.5 - 5.1 mmol/L   Chloride 104 96 - 112 mmol/L   CO2 24 19 - 32 mmol/L   Glucose, Bld 178 (H) 70 - 99 mg/dL   BUN 9 6 - 23 mg/dL   Creatinine, Ser 7.82 (L) 0.50 - 1.10 mg/dL   Calcium 8.8 8.4 - 95.6 mg/dL   Total Protein 6.8 6.0 - 8.3 g/dL   Albumin 3.3 (L) 3.5 - 5.2 g/dL   AST 26 0 - 37 U/L   ALT 28 0 - 35 U/L   Alkaline Phosphatase 67 39 - 117  U/L   Total Bilirubin 0.2 (L) 0.3 - 1.2 mg/dL   GFR calc non Af Amer >90 >90 mL/min   GFR calc Af Amer >90 >90 mL/min   Anion gap 7 5 - 15  hCG, quantitative, pregnancy     Status: Abnormal   Collection Time: 07/27/14  2:40 PM  Result Value Ref Range   hCG, Beta Chain, Quant, S 34809 (H) <5 mIU/mL   Results for orders placed or performed during the hospital encounter of 07/27/14 (from the past 24 hour(s))  Urinalysis, Routine w reflex microscopic     Status: Abnormal   Collection Time: 07/27/14 12:11 PM  Result Value Ref Range   Color, Urine YELLOW YELLOW   APPearance CLEAR CLEAR   Specific Gravity, Urine 1.025 1.005 - 1.030   pH 6.0 5.0 - 8.0   Glucose, UA >1000 (A) NEGATIVE mg/dL   Hgb urine dipstick NEGATIVE NEGATIVE   Bilirubin Urine NEGATIVE NEGATIVE   Ketones, ur NEGATIVE NEGATIVE mg/dL   Protein, ur NEGATIVE NEGATIVE mg/dL   Urobilinogen, UA 0.2 0.0 - 1.0 mg/dL   Nitrite NEGATIVE NEGATIVE   Leukocytes, UA NEGATIVE NEGATIVE  Urine microscopic-add on     Status: None   Collection Time: 07/27/14 12:11 PM  Result Value Ref Range   Squamous Epithelial / LPF RARE RARE   RBC / HPF 0-2 <3 RBC/hpf   Bacteria, UA RARE RARE   Urine-Other MUCOUS PRESENT   Glucose, capillary     Status: Abnormal   Collection Time: 07/27/14  1:45 PM  Result Value Ref Range   Glucose-Capillary 163 (H) 70 - 99 mg/dL  CBC     Status: Abnormal   Collection Time: 07/27/14  2:40 PM  Result Value Ref Range   WBC 4.7 4.0 - 10.5 K/uL   RBC 3.34 (L) 3.87 - 5.11 MIL/uL   Hemoglobin 10.7 (L) 12.0 - 15.0 g/dL   HCT 16.1 (L) 09.6 - 04.5 %   MCV 93.7 78.0 -  100.0 fL   MCH 32.0 26.0 - 34.0 pg   MCHC 34.2 30.0 - 36.0 g/dL   RDW 40.9 81.1 - 91.4 %   Platelets 230 150 - 400 K/uL  Comprehensive metabolic panel     Status: Abnormal   Collection Time: 07/27/14  2:40 PM  Result Value Ref Range   Sodium 135 135 - 145 mmol/L   Potassium 4.0 3.5 - 5.1 mmol/L   Chloride 104 96 - 112 mmol/L   CO2 24 19 - 32 mmol/L   Glucose, Bld 178 (H) 70 - 99 mg/dL   BUN 9 6 - 23 mg/dL   Creatinine, Ser 7.82 (L) 0.50 - 1.10 mg/dL   Calcium 8.8 8.4 - 95.6 mg/dL   Total Protein 6.8 6.0 - 8.3 g/dL   Albumin 3.3 (L) 3.5 - 5.2 g/dL   AST 26 0 - 37 U/L   ALT 28 0 - 35 U/L   Alkaline Phosphatase 67 39 - 117 U/L   Total Bilirubin 0.2 (L) 0.3 - 1.2 mg/dL   GFR calc non Af Amer >90 >90 mL/min   GFR calc Af Amer >90 >90 mL/min   Anion gap 7 5 - 15  hCG, quantitative, pregnancy     Status: Abnormal   Collection Time: 07/27/14  2:40 PM  Result Value Ref Range   hCG, Beta Chain, Quant, S 34809 (H) <5 mIU/mL  US Ob Comp Less 14 Wks  07/27/2014   CLINICAL DATA:  Pregnant, bleeding  EXAM: OBSTETRIC <14  WK ULTRASOUND  TECHNIQUE: Transabdominal ultrasound was performed for evaluation of the gestation as well as the maternal uterus and adnexal regions.  COMPARISON:  None.  FINDINGS: Intrauterine gestational sac: Visualized/normal in shape.  Yolk sac:  Present  Embryo:  Present  Cardiac Activity: Present  Heart Rate: Present bpm  CRL:   15.7  mm   8 w 0 d                  [place] EDC: 03/08/2015  Maternal uterus/adnexae: Moderate subchronic hemorrhage.  Bilateral ovaries are within normal limits.  No free fluid.  IMPRESSION: Single live intrauterine gestation, with estimated gestational age [redacted] weeks 0 days by crown-rump length.   Electronically Signed   By: Charline Bills M.D.   On: 07/27/2014 15:34    Assessment and Plan  [facility] IUP [redacted]w[redacted]d by [place] Follow up with Dr. Clearance Coots for OB care Type I diabetes  [name] 07/27/2014, 2:04 PM

## 2014-07-27 NOTE — MAU Note (Signed)
Pt reports she had some spotting when she wiped today. Denies pain or cramping.

## 2014-07-27 NOTE — Discharge Instructions (Signed)
Safe Medications in Pregnancy   Acne: Benzoyl Peroxide Salicylic Acid  Backache/Headache: Tylenol: 2 regular strength every 4 hours OR              2 Extra strength every 6 hours  Colds/Coughs/Allergies: Benadryl (alcohol free) 25 mg every 6 hours as needed Breath right strips Claritin Cepacol throat lozenges Chloraseptic throat spray Cold-Eeze- up to three times per day Cough drops, alcohol free Flonase (by prescription only) Guaifenesin Mucinex Robitussin DM (plain only, alcohol free) Saline nasal spray/drops Sudafed (pseudoephedrine) & Actifed ** use only after [redacted] weeks gestation and if you do not have high blood pressure Tylenol Vicks Vaporub Zinc lozenges Zyrtec   Constipation: Colace Ducolax suppositories Fleet enema Glycerin suppositories Metamucil Milk of magnesia Miralax Senokot Smooth move tea  Diarrhea: Kaopectate Imodium A-D  *NO pepto Bismol  Hemorrhoids: Anusol Anusol HC Preparation H Tucks  Indigestion: Tums Maalox Mylanta Zantac  Pepcid  Insomnia: Benadryl (alcohol free) 25mg every 6 hours as needed Tylenol PM Unisom, no Gelcaps  Leg Cramps: Tums MagGel  Nausea/Vomiting:  Bonine Dramamine Emetrol Ginger extract Sea bands Meclizine  Nausea medication to take during pregnancy:  Unisom (doxylamine succinate 25 mg tablets) Take one tablet daily at bedtime. If symptoms are not adequately controlled, the dose can be increased to a maximum recommended dose of two tablets daily (1/2 tablet in the morning, 1/2 tablet mid-afternoon and one at bedtime). Vitamin B6 100mg tablets. Take one tablet twice a day (up to 200 mg per day).  Skin Rashes: Aveeno products Benadryl cream or 25mg every 6 hours as needed Calamine Lotion 1% cortisone cream  Yeast infection: Gyne-lotrimin 7 Monistat 7   **If taking multiple medications, please check labels to avoid duplicating the same active ingredients **take medication as directed on  the label ** Do not exceed 4000 mg of tylenol in 24 hours **Do not take medications that contain aspirin or ibuprofen     

## 2014-08-10 ENCOUNTER — Encounter: Payer: Self-pay | Admitting: Certified Nurse Midwife

## 2014-08-10 ENCOUNTER — Ambulatory Visit (INDEPENDENT_AMBULATORY_CARE_PROVIDER_SITE_OTHER): Payer: 59 | Admitting: Certified Nurse Midwife

## 2014-08-10 ENCOUNTER — Telehealth: Payer: Self-pay

## 2014-08-10 VITALS — BP 114/76 | HR 112 | Temp 98.4°F | Wt 171.0 lb

## 2014-08-10 DIAGNOSIS — IMO0001 Reserved for inherently not codable concepts without codable children: Secondary | ICD-10-CM

## 2014-08-10 DIAGNOSIS — O0991 Supervision of high risk pregnancy, unspecified, first trimester: Secondary | ICD-10-CM

## 2014-08-10 DIAGNOSIS — E119 Type 2 diabetes mellitus without complications: Secondary | ICD-10-CM

## 2014-08-10 DIAGNOSIS — Z794 Long term (current) use of insulin: Secondary | ICD-10-CM

## 2014-08-10 LAB — POCT URINALYSIS DIPSTICK
Bilirubin, UA: NEGATIVE
Blood, UA: NEGATIVE
Glucose, UA: 100
Ketones, UA: NEGATIVE
Leukocytes, UA: NEGATIVE
Nitrite, UA: NEGATIVE
Protein, UA: NEGATIVE
Spec Grav, UA: 1.015
Urobilinogen, UA: NEGATIVE
pH, UA: 6

## 2014-08-10 LAB — COMPREHENSIVE METABOLIC PANEL
ALT: 16 U/L (ref 0–35)
AST: 14 U/L (ref 0–37)
Albumin: 3.4 g/dL — ABNORMAL LOW (ref 3.5–5.2)
Alkaline Phosphatase: 53 U/L (ref 39–117)
BUN: 14 mg/dL (ref 6–23)
CO2: 24 mEq/L (ref 19–32)
Calcium: 9.3 mg/dL (ref 8.4–10.5)
Chloride: 103 mEq/L (ref 96–112)
Creat: 0.47 mg/dL — ABNORMAL LOW (ref 0.50–1.10)
Glucose, Bld: 59 mg/dL — ABNORMAL LOW (ref 70–99)
Potassium: 4 mEq/L (ref 3.5–5.3)
Sodium: 136 mEq/L (ref 135–145)
Total Bilirubin: 0.3 mg/dL (ref 0.2–1.2)
Total Protein: 6.4 g/dL (ref 6.0–8.3)

## 2014-08-10 MED ORDER — GLUCOSE 4 G PO CHEW: 1.0000 | CHEWABLE_TABLET | ORAL | Status: DC | PRN

## 2014-08-10 MED ORDER — EPINEPHRINE 0.3 MG/0.3ML IJ SOAJ: 0.3000 mg | Freq: Once | INTRAMUSCULAR | Status: DC

## 2014-08-10 NOTE — Progress Notes (Signed)
Subjective:    [name] is being seen today for her first obstetrical visit.  This is a planned pregnancy. She is at [redacted]w[redacted]d gestation. Her obstetrical history is significant for IDDM, on insulin pump. Relationship with FOB: spouse, living together. Patient does intend to breast feed. Pregnancy history fully reviewed.  Recently stopped working.    The information documented in the HPI was reviewed and verified.  Menstrual History: OB History    Gravida Para Term Preterm AB TAB SAB Ectopic Multiple Living   1               Menarche age: 8  Patient's last menstrual period was 05/26/2014.    Past Medical History  Diagnosis Date  . Diabetes mellitus   . Hypertension   . Diabetes type 1, uncontrolled 11/14/2011    Since age 29  . Gastroparesis   . Anxiety   . Infection     UTI April 2016  . Fibromyalgia     Past Surgical History  Procedure Laterality Date  . Ankle surgery    . Cholecystectomy  11/15/2011    Procedure: LAPAROSCOPIC CHOLECYSTECTOMY WITH INTRAOPERATIVE CHOLANGIOGRAM;  Surgeon: [name], MD;  Location: [facility];  Service: General;  Laterality: N/A;  . Laparoscopy  11/23/2011    Procedure: LAPAROSCOPY DIAGNOSTIC;  Surgeon: Mariella Saa, MD;  Location: [facility];  Service: General;  Laterality: N/A;  . Esophagogastroduodenoscopy  12/03/2011    Procedure: ESOPHAGOGASTRODUODENOSCOPY (EGD);  Surgeon: Theda Belfast, MD;  Location: [facility];  Service: Endoscopy;  Laterality: N/A;     (Not in a hospital admission) Allergies  Allergen Reactions  . Peanut-Containing Drug Products Anaphylaxis    [place] nuts specifically      History  Substance Use Topics  . Smoking status: Never Smoker   . Smokeless tobacco: Never Used  . Alcohol Use: 0.0 oz/week    0 Standard drinks or equivalent per week     Comment: Occasional, once a month mixed drink- when not pregnant    Family History  Problem Relation Age of Onset  . Diabetes Mother   . Hypertension Father       Review of Systems Constitutional: negative for weight loss Gastrointestinal: negative for vomiting Genitourinary:negative for genital lesions and vaginal discharge and dysuria Musculoskeletal:negative for back pain Behavioral/Psych: negative for abusive relationship, depression, illegal drug usage and tobacco use    Objective:    BP 114/76 mmHg  Pulse 112  Temp(Src) 98.4 F (36.9 C)  Wt 77.565 kg (171 lb)  LMP 05/26/2014 General Appearance:    Alert, cooperative, no distress, appears stated age  Head:    Normocephalic, without obvious abnormality, atraumatic  Eyes:    PERRL, conjunctiva/corneas clear, EOM's intact, fundi    benign, both eyes  Ears:    Normal TM's and external ear canals, both ears  Nose:   Nares normal, septum midline, mucosa normal, no drainage    or sinus tenderness  Throat:   Lips, mucosa, and tongue normal; teeth and gums normal  Neck:   Supple, symmetrical, trachea midline, no adenopathy;    thyroid:  no enlargement/tenderness/nodules; no carotid   bruit or JVD  Back:     Symmetric, no curvature, ROM normal, no CVA tenderness  Lungs:     Clear to auscultation bilaterally, respirations unlabored  Chest Wall:    No tenderness or deformity   Heart:    Regular rate and rhythm, S1 and S2 normal, no murmur, rub   or  gallop  Breast Exam:    No tenderness, masses, or nipple abnormality  Abdomen:     Soft, non-tender, bowel sounds active all four quadrants,    no masses, no organomegaly  Genitalia:    Normal female without lesion, discharge or tenderness  Extremities:   Extremities normal, atraumatic, no cyanosis or edema  Pulses:   2+ and symmetric all extremities  Skin:   Skin color, texture, turgor normal, no rashes or lesions  Lymph nodes:   Cervical, supraclavicular, and axillary nodes normal  Neurologic:   CNII-XII intact, normal strength, sensation and reflexes    throughout      Lab Review Urine pregnancy test Labs reviewed yes Radiologic  studies reviewed yes Assessment:    Pregnancy at [redacted]w[redacted]d weeks    Plan:      Prenatal vitamins.  Counseling provided regarding continued use of seat belts, cessation of alcohol consumption, smoking or use of illicit drugs; infection precautions i.e., influenza/TDAP immunizations, toxoplasmosis,CMV, parvovirus, listeria and varicella; workplace safety, exercise during pregnancy; routine dental care, safe medications, sexual activity, hot tubs, saunas, pools, travel, caffeine use, fish and methlymercury, potential toxins, hair treatments, varicose veins Weight gain recommendations per IOM guidelines reviewed: underweight/BMI< 18.5--> gain 28 - 40 lbs; normal weight/BMI 18.5 - 24.9--> gain 25 - 35 lbs; overweight/BMI 25 - 29.9--> gain 15 - 25 lbs; obese/BMI >30->gain  11 - 20 lbs Problem list reviewed and updated. FIRST/CF mutation testing/NIPT/QUAD SCREEN/fragile X/Ashkenazi Jewish population testing/Spinal muscular atrophy discussed: requested. Role of ultrasound in pregnancy discussed; fetal survey: requested. Amniocentesis discussed: not indicated.  Meds ordered this encounter  Medications  . b complex vitamins capsule    Sig: Take 1 capsule by mouth daily.   Orders Placed This Encounter  Procedures  . Culture, OB Urine  . Obstetric panel  . HIV antibody  . Hemoglobinopathy evaluation  . Varicella zoster antibody, IgG  . Vit D  25 hydroxy (rtn osteoporosis monitoring)  . POCT urinalysis dipstick    Follow up in 4 weeks. 50% of 30 min visit spent on counseling and coordination of care.

## 2014-08-10 NOTE — Addendum Note (Signed)
Addended by: Henriette Combs on: 08/10/2014 05:13 PM   Modules accepted: Orders

## 2014-08-10 NOTE — Telephone Encounter (Signed)
PATIENT HAS APPT WITH [facility] ON 5/26 AT 2PM - SPOKE WITH PATIENT

## 2014-08-11 LAB — CBC
HCT: 36.6 % (ref 36.0–46.0)
Hemoglobin: 12.2 g/dL (ref 12.0–15.0)
MCH: 31.1 pg (ref 26.0–34.0)
MCHC: 33.3 g/dL (ref 30.0–36.0)
MCV: 93.4 fL (ref 78.0–100.0)
MPV: 8.5 fL — ABNORMAL LOW (ref 8.6–12.4)
Platelets: 260 10*3/uL (ref 150–400)
RBC: 3.92 MIL/uL (ref 3.87–5.11)
RDW: 12.1 % (ref 11.5–15.5)
WBC: 5.7 10*3/uL (ref 4.0–10.5)

## 2014-08-11 LAB — HEMOGLOBIN A1C
Hgb A1c MFr Bld: 7.3 % — ABNORMAL HIGH (ref <5.7)
Mean Plasma Glucose: 163 mg/dL — ABNORMAL HIGH (ref <117)

## 2014-08-14 LAB — SURESWAB, VAGINOSIS/VAGINITIS PLUS
Atopobium vaginae: NOT DETECTED Log (cells/mL)
C. albicans, DNA: DETECTED — AB
C. glabrata, DNA: NOT DETECTED
C. parapsilosis, DNA: NOT DETECTED
C. trachomatis RNA, TMA: NOT DETECTED
C. tropicalis, DNA: NOT DETECTED
Gardnerella vaginalis: 4.9 Log (cells/mL)
LACTOBACILLUS SPECIES: >8 Log (cells/mL)
MEGASPHAERA SPECIES: NOT DETECTED Log (cells/mL)
N. gonorrhoeae RNA, TMA: NOT DETECTED
T. vaginalis RNA, QL TMA: NOT DETECTED

## 2014-08-15 LAB — PAP IG W/ RFLX HPV ASCU

## 2014-08-22 ENCOUNTER — Other Ambulatory Visit: Payer: Self-pay | Admitting: Obstetrics

## 2014-08-22 DIAGNOSIS — Z3682 Encounter for antenatal screening for nuchal translucency: Secondary | ICD-10-CM

## 2014-08-23 ENCOUNTER — Other Ambulatory Visit: Payer: Self-pay | Admitting: *Deleted

## 2014-08-23 DIAGNOSIS — B379 Candidiasis, unspecified: Secondary | ICD-10-CM

## 2014-08-23 MED ORDER — TERCONAZOLE 0.4 % VA CREA: 1.0000 | TOPICAL_CREAM | Freq: Every day | VAGINAL | Status: DC

## 2014-08-23 NOTE — Progress Notes (Signed)
See lab note. Rx sent to pharmacy for yeast inf.

## 2014-08-24 ENCOUNTER — Ambulatory Visit (HOSPITAL_COMMUNITY)
Admission: RE | Admit: 2014-08-24 | Discharge: 2014-08-24 | Disposition: A | Discharge status: Home / Self Care | Payer: 59 | Source: Ambulatory Visit | Attending: Obstetrics | Admitting: Obstetrics

## 2014-08-24 ENCOUNTER — Encounter (HOSPITAL_COMMUNITY): Payer: Self-pay

## 2014-08-24 ENCOUNTER — Encounter: Payer: 59 | Attending: Obstetrics | Admitting: *Deleted

## 2014-08-24 DIAGNOSIS — Z713 Dietary counseling and surveillance: Secondary | ICD-10-CM | POA: Diagnosis not present

## 2014-08-24 DIAGNOSIS — Z794 Long term (current) use of insulin: Secondary | ICD-10-CM | POA: Diagnosis not present

## 2014-08-24 DIAGNOSIS — O24012 Pre-existing diabetes mellitus, type 1, in pregnancy, second trimester: Secondary | ICD-10-CM | POA: Diagnosis not present

## 2014-08-24 DIAGNOSIS — Z36 Encounter for antenatal screening of mother: Secondary | ICD-10-CM | POA: Diagnosis not present

## 2014-08-24 DIAGNOSIS — Z3A12 12 weeks gestation of pregnancy: Secondary | ICD-10-CM | POA: Insufficient documentation

## 2014-08-24 DIAGNOSIS — Z3682 Encounter for antenatal screening for nuchal translucency: Secondary | ICD-10-CM | POA: Insufficient documentation

## 2014-08-24 DIAGNOSIS — O169 Unspecified maternal hypertension, unspecified trimester: Secondary | ICD-10-CM | POA: Insufficient documentation

## 2014-08-24 DIAGNOSIS — O24011 Pre-existing diabetes mellitus, type 1, in pregnancy, first trimester: Secondary | ICD-10-CM | POA: Insufficient documentation

## 2014-08-24 DIAGNOSIS — Z369 Encounter for antenatal screening, unspecified: Secondary | ICD-10-CM | POA: Insufficient documentation

## 2014-08-24 IMAGING — US US MFM FETAL NUCHAL TRANSLUCENCY
1 series · 13 of 28 positions shown · non-contrast
Comparison: none

OBSTETRICS REPORT
(Signed Final [DATE] [DATE])

Service(s) Provided
Indications
12 weeks gestation of pregnancy
First trimester aneuploidy screen (NT)                Z36
Diabetes - Pregestational, 1st trimester (on insulin) [15]
Pre-existing essential hypertension complicating      [15]
pregnancy, first trimester
Fetal Evaluation
Num Of Fetuses:    1
Fetal Heart Rate:  180                          bpm
Cardiac Activity:  Observed
Presentation:      Transverse, head to
maternal left
Placenta:          Posterior
Amniotic Fluid
AFI FV:      Subjectively within normal limits
Gestational Age
LMP:           12w 6d        Date:  [DATE]                 EDD:   [DATE]
Best:          12w 6d     Det. By:  LMP  ([DATE])          EDD:   [DATE]
1st Trimester Genetic Sonogram Screening
CRL:            63.4  mm    G. Age:   12w 4d                 EDD:   [DATE]
Nuc Trans:       2.0  mm
Nasal Bone:                 Present
Anatomy
Cranium:          Appears normal         Cord Vessels:     Appears normal (3
vessel cord)
Choroid Plexus:   Appears normal         Lower             Visualized
Extremities:
Stomach:          Appears normal, left   Upper             Visualized
sided                  Extremities:
Cervix Uterus Adnexa
Cervix:       Normal appearance by transabdominal scan.
Left Ovary:    Within normal limits.
Right Ovary:   Within normal limits.
Adnexa:     No abnormality visualized.
Impression
INDICATION: 24 yr old G1P0 at [15] with type I diabetes and
chronic hypertension for nuchal translucency.

[Series 1: us mfm fetal nuchal translucency · 13 of 31 slices shown]
[im 2/31]
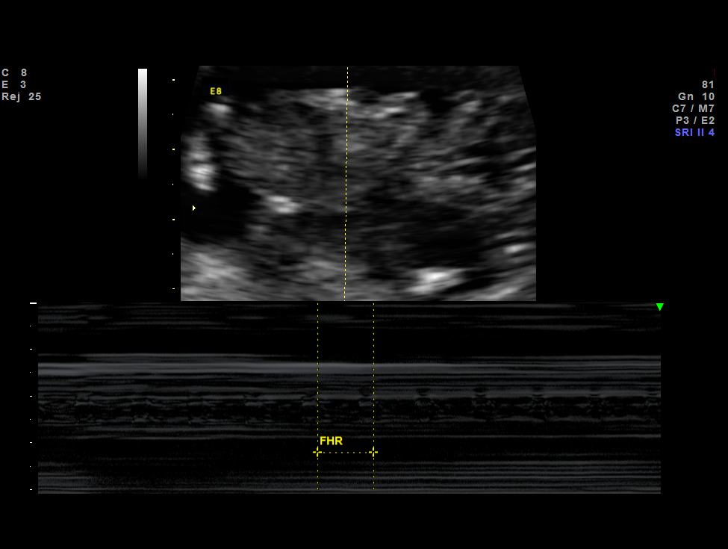
[im 4/31]
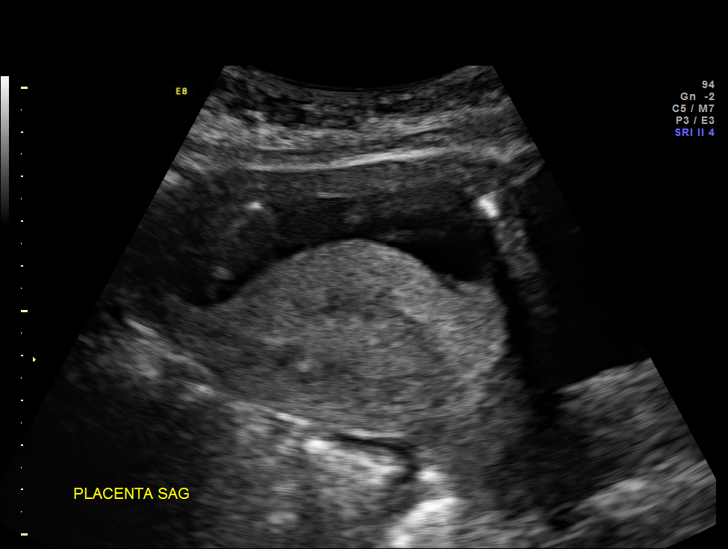
[im 6/31]
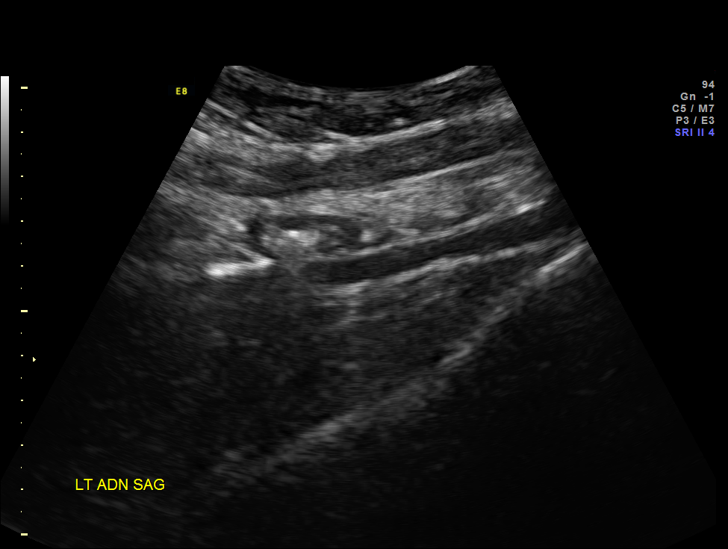
[im 8/31]
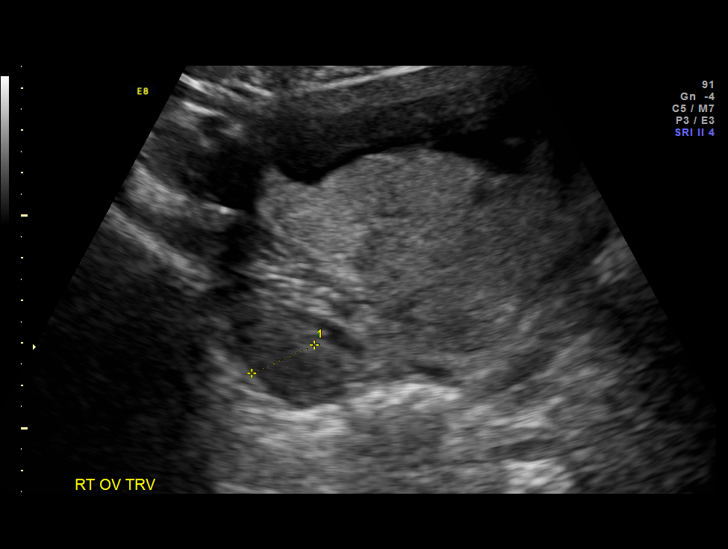
[im 11/31]
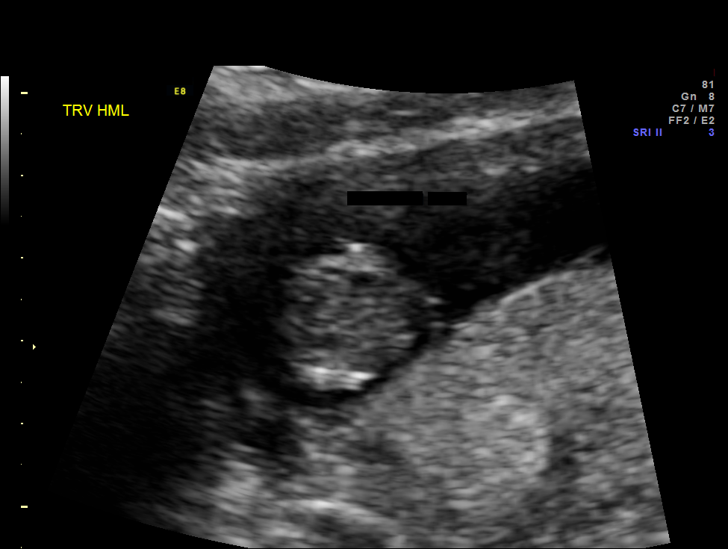
[im 13/31]
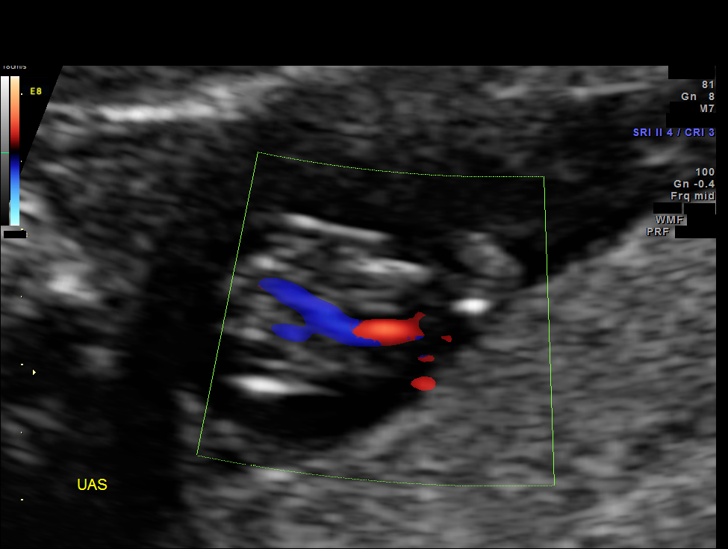
[im 16/31]
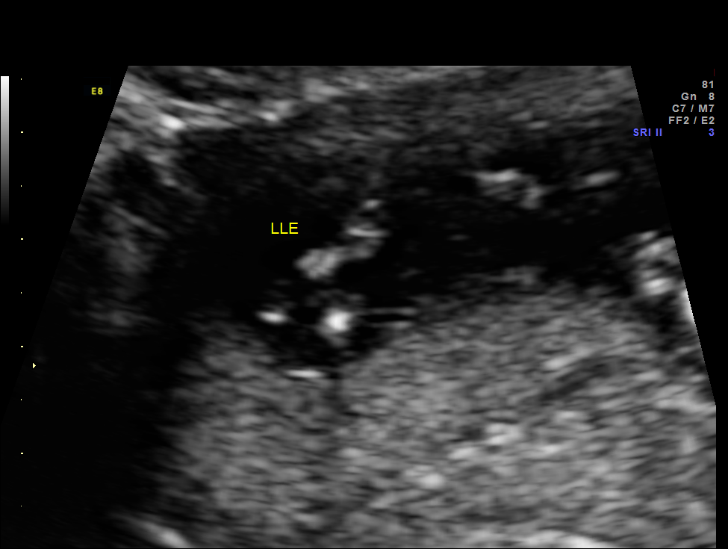
[im 18/31]
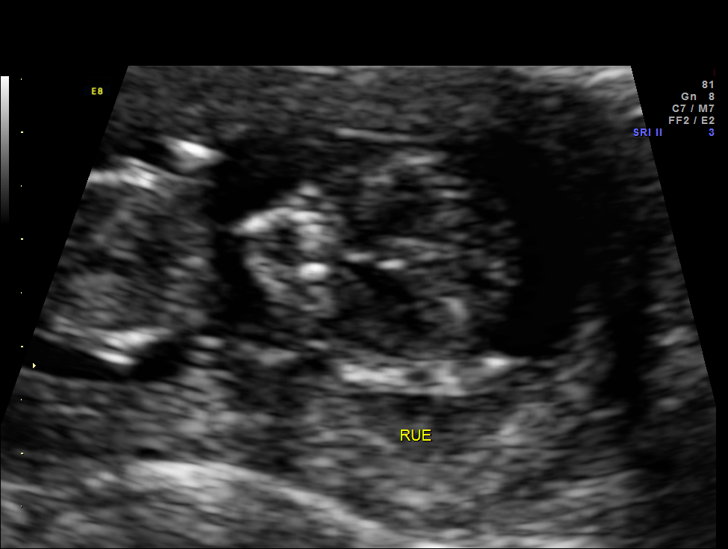
[im 21/31]
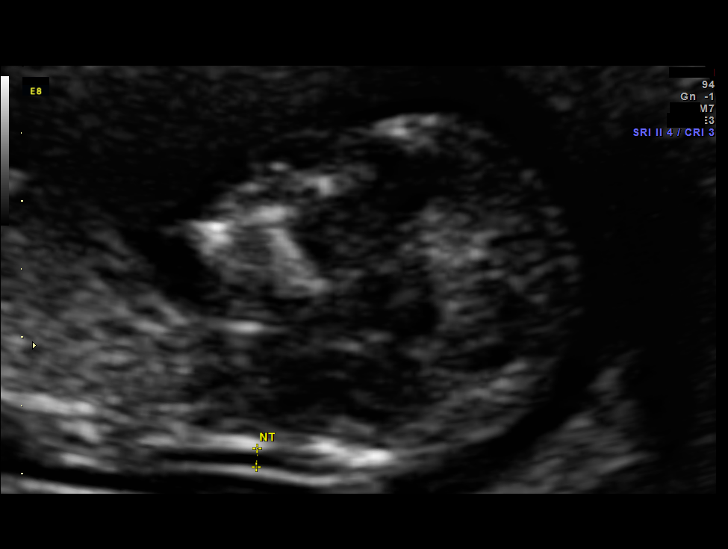
[im 23/31]
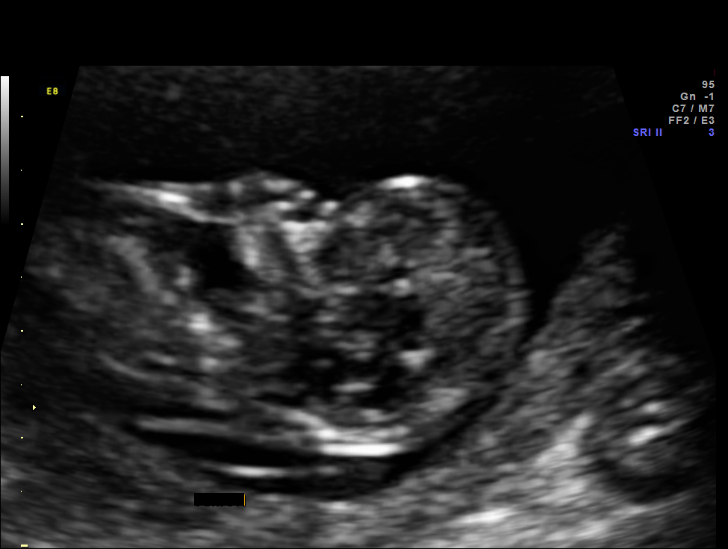
[im 25/31]
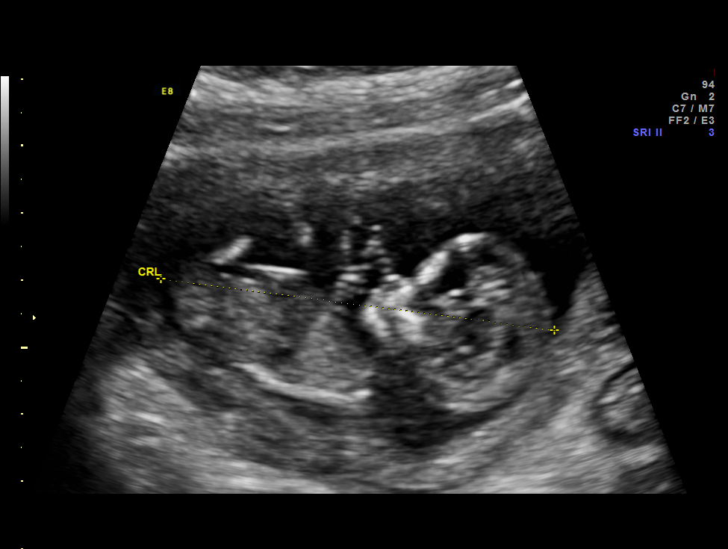
[im 27/31]
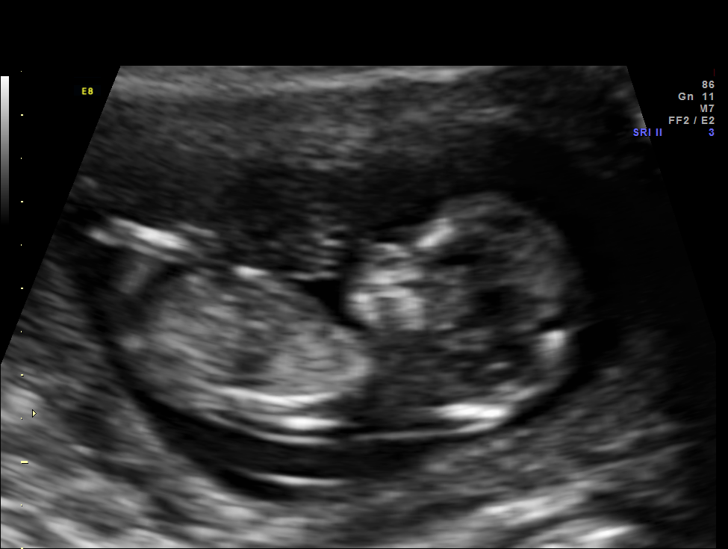
[im 29/31]
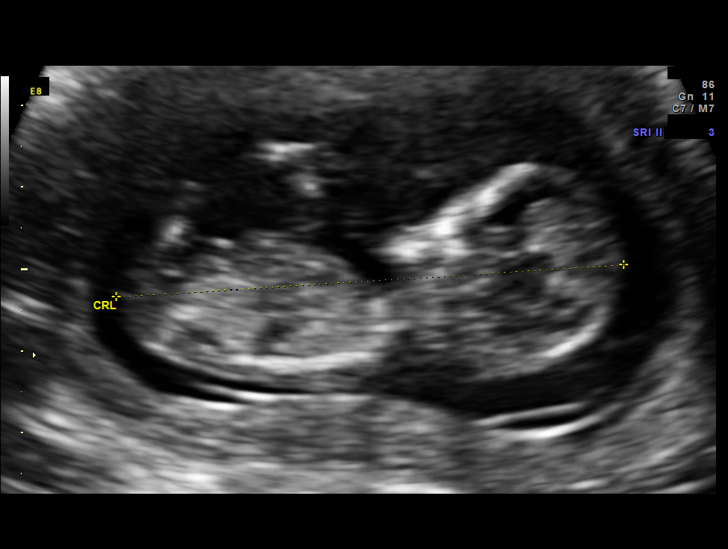

[13 of 28 positions shown; findings below may reference images not displayed]

FINDINGS: 1. Single intrauterine pregnancy.
2. Fetal crown rump length is consistent with dating.
3. Normal uterus; no adnexal masses seen.
4. Evaluation of fetal anatomy is limited by early gestational
age.
5. Normal nuchal translucency measuring 2mm.
6. The nasal bone is visualized.
Recommendations

1. Appropriate fetal growth.
2. First trimester screen done today:
- discussed limitations of screening tests in detecting fetal
aneuploidy
3. Recommend maternal serum AFP at 15-20 weeks
4. Recommend fetal anatomic survey at 18-20 weeks
5. Diabetes:
- recommend fetal echocardiogram at 18-22 weeks
- recommend fetal growth every 4 weeks
- recommend antenatal testing starting at 32 weeks
- recommend strict glucose control
6. Hypertension:
- well controlled off of medication
- recommend fetal surveillance as above
- recommend low dose aspirin 81mg/day given multiple risk
factors for preeclampsia
- recommend close surveillance for the development of
signs/symptoms of preeclampsia

questions or concerns.

## 2014-08-24 NOTE — Progress Notes (Signed)
Franki presents with significant other. She has had nothing to eat today and has a FBS of 67mg /dl. She is symptomatic. 4oz apple juice 2 graham crackers and peanut butter given. After meal reading 128mg /dl. She is referred here for diabetes management by Dr. Clearance Coots. Patient was diagnosed with T1DM at the age of 24yo. She is a patient of Dr. Napoleon Form (Endocrinology). Her A1c is 8.1% as of 2 weeks ago. [name] has been utilizing a T-slim insulin pump but is having difficulties with the infusion at this time. At this time she is seeing Dr. Sharl Ma every 2 months for insulin management. At present she is taking Levimir 14uAM and 14u HS. Her correction is 1u:40mg /dl with a goal of 150mg /dl. Her meal coverage is 1u to every 8grams. This is modified as needed by Dr. Sharl Ma.

## 2014-08-30 ENCOUNTER — Telehealth (HOSPITAL_COMMUNITY): Payer: Self-pay | Admitting: Genetics

## 2014-08-30 ENCOUNTER — Other Ambulatory Visit (HOSPITAL_COMMUNITY): Payer: Self-pay | Admitting: Certified Nurse Midwife

## 2014-08-30 NOTE — Telephone Encounter (Signed)
Called [name] to review the results of her First trimester screen. She verified her full name and date of birth. Since I was calling her cell phone I verified that she was at home and in a place where she could talk freely.  We discussed that the chance for Down syndrome was increased above her age related chance of 1 in 47 to 1 in 44.  She was counseled that this chance is still less than a 1% chance for Down syndrome in pregnancy and a greater than 99% chance that her baby does not have Down syndrome.  We discussed other possible reasons for the results including normal variation.  She was offered the option of coming into the office to meet with a genetic counselor to discuss these results in person, as well as discuss what other options were available for additional testing or screening.  She declined an appointment at this time.  She wished to discuss the information with her husband and will contact us if she would like an appointment to review these results or the available testing or screening.  She reports she has no questions as this time.  I reinforced the 121 out of 122 chances that the baby does NOT have Down syndrome and she expressed no concern about these results.

## 2014-09-06 ENCOUNTER — Emergency Department (HOSPITAL_COMMUNITY)
Admission: EM | Admit: 2014-09-06 | Discharge: 2014-09-06 | Disposition: A | Discharge status: Home / Self Care | Payer: 59 | Attending: Emergency Medicine | Admitting: Emergency Medicine

## 2014-09-06 ENCOUNTER — Encounter (HOSPITAL_COMMUNITY): Payer: Self-pay | Admitting: Emergency Medicine

## 2014-09-06 DIAGNOSIS — O10912 Unspecified pre-existing hypertension complicating pregnancy, second trimester: Secondary | ICD-10-CM | POA: Insufficient documentation

## 2014-09-06 DIAGNOSIS — O2402 Pre-existing diabetes mellitus, type 1, in childbirth: Secondary | ICD-10-CM | POA: Diagnosis not present

## 2014-09-06 DIAGNOSIS — Z8659 Personal history of other mental and behavioral disorders: Secondary | ICD-10-CM | POA: Diagnosis not present

## 2014-09-06 DIAGNOSIS — Z3A14 14 weeks gestation of pregnancy: Secondary | ICD-10-CM | POA: Diagnosis not present

## 2014-09-06 DIAGNOSIS — Z794 Long term (current) use of insulin: Secondary | ICD-10-CM | POA: Diagnosis not present

## 2014-09-06 DIAGNOSIS — Z79899 Other long term (current) drug therapy: Secondary | ICD-10-CM | POA: Diagnosis not present

## 2014-09-06 DIAGNOSIS — Z8719 Personal history of other diseases of the digestive system: Secondary | ICD-10-CM | POA: Diagnosis not present

## 2014-09-06 DIAGNOSIS — M791 Myalgia, unspecified site: Secondary | ICD-10-CM

## 2014-09-06 DIAGNOSIS — O26892 Other specified pregnancy related conditions, second trimester: Secondary | ICD-10-CM | POA: Diagnosis not present

## 2014-09-06 DIAGNOSIS — Z87440 Personal history of urinary (tract) infections: Secondary | ICD-10-CM | POA: Diagnosis not present

## 2014-09-06 LAB — CBG MONITORING, ED: Glucose-Capillary: 153 mg/dL — ABNORMAL HIGH (ref 65–99)

## 2014-09-06 MED ORDER — OXYCODONE HCL 5 MG PO TABS
5.0000 mg | ORAL_TABLET | Freq: Once | ORAL | Status: AC
  Administered 2014-09-06: 5 mg via ORAL
  Filled 2014-09-06: qty 1

## 2014-09-06 MED ORDER — ACETAMINOPHEN 500 MG PO TABS
1000.0000 mg | ORAL_TABLET | Freq: Once | ORAL | Status: AC
  Administered 2014-09-06: 1000 mg via ORAL
  Filled 2014-09-06: qty 2

## 2014-09-06 MED ORDER — HYDROCODONE-ACETAMINOPHEN 5-325 MG PO TABS: 1.0000 | ORAL_TABLET | ORAL | Status: DC | PRN

## 2014-09-06 NOTE — ED Notes (Signed)
Pt states she woke up a couple hours ago with sharp pains radiating down both legs  Pt states now the pain is going down her legs and into her arms  Pt states she is [redacted] weeks pregnant

## 2014-09-06 NOTE — Discharge Instructions (Signed)
Myofascial Pain Syndrome °Myofascial pain syndrome is a pain disorder. This pain may be felt in the muscles. It may come and go. Myofascial pain syndrome always has trigger or tender points in the muscle that will cause pain when pressed.  °CAUSES °Myofascial pain may be caused by injuries, especially auto accidents, or by overuse of certain muscles. Typically the pain is long lasting. It is made worse by overuse of the involved muscles, emotional distress, and by cold, damp weather. Myofascial pain syndrome often develops in patients whose response to stress is an increase in muscle tone, and is seen in greater frequency in patients with pre-existing tension headaches. °SYMPTOMS  °Myofascial pain syndrome causes a wide variety of symptoms. You may see tight ropy bands of muscle. Problems may also include aching, cramping, burning, numbness, tingling, and other uncomfortable sensations in muscular areas. It most commonly affects the neck, upper back, and shoulder areas. Pain often radiates into the arms and hands.  °TREATMENT °Treatment includes resting the affected muscular area and applying ice packs to reduce spasm and pain. Trigger point injection, is a valuable initial therapy. This therapy is an injection of local anesthetic directly into the trigger point. Trigger points are often present at the source of pain. Pain relief following injection confirms the diagnosis of myofascial pain syndrome. Fairly vigorous therapy can be carried out during the pain-free period after each injection. Stretching exercises to loosen up the muscles are also useful. Transcutaneous electrical nerve stimulation (TENS) may provide relief from pain. TENS is the use of electric current produced by a device to stimulate the nerves. Ultrasound therapy applied directly over the affected muscle may also provide pain relief. Anti-inflammatory pain medicine can be helpful. Symptoms will gradually improve over a period of weeks to months  with proper treatment. °HOME CARE INSTRUCTIONS °Call your caregiver for follow-up care as recommended.  °SEEK MEDICAL CARE IF:  °Your pain is severe and not helped with medications. °Document Released: 04/24/2004 Document Revised: 06/09/2011 Document Reviewed: 05/03/2010 °Exit[employer]® Patient Information ©2015 [employer]. This information is not intended to replace advice given to you by your health care provider. Make sure you discuss any questions you have with your health care provider. ° °

## 2014-09-06 NOTE — ED Provider Notes (Signed)
CSN: [id number]     Arrival date & time 09/06/14  0356 History   First MD Initiated Contact with Patient 09/06/14 0801     Chief Complaint  Patient presents with  . Leg Pain     (Consider location/radiation/quality/duration/timing/severity/associated sxs/prior Treatment) HPI \\This  is a 24 year old female with a past medical history of type 1 diabetes. She also has a history of fibromyalgia, previous history of myalgias here in the emergency department and is currently G1, P0 with an estimated gestational age of [redacted] weeks. The patient states that today she was awoken from sleep with pain that started in her lower back and radiated down into her legs. She describes it as stabbing. She states that it is the entire leg and not just to the back or front. She said it then traveled up her back and into her bilateral arms. She tried Tylenol without relief. She continues to have intermittent but severe, sharp, stabbing pain in her back and extremities. She states she has had a previously and thinks it may be related to her blood sugars. She states her last A1c was 8.5, which is "good for me." She denies weakness, loss of bowel or bladder control, recent procedures to her spine. She denies other neurologic symptoms such as headache, visual disturbances, changes in speech. She denies any abdominal or vaginal symptoms.   Past Medical History  Diagnosis Date  . Diabetes mellitus   . Hypertension   . Diabetes type 1, uncontrolled 11/14/2011    Since age 61  . Gastroparesis   . Anxiety   . Infection     UTI April 2016  . Fibromyalgia    Past Surgical History  Procedure Laterality Date  . Ankle surgery    . Cholecystectomy  11/15/2011    Procedure: LAPAROSCOPIC CHOLECYSTECTOMY WITH INTRAOPERATIVE CHOLANGIOGRAM;  Surgeon: [name], MD;  Location: [facility];  Service: General;  Laterality: N/A;  . Laparoscopy  11/23/2011    Procedure: LAPAROSCOPY DIAGNOSTIC;  Surgeon: Mariella Saa, MD;   Location: [facility];  Service: General;  Laterality: N/A;  . Esophagogastroduodenoscopy  12/03/2011    Procedure: ESOPHAGOGASTRODUODENOSCOPY (EGD);  Surgeon: Theda Belfast, MD;  Location: [facility];  Service: Endoscopy;  Laterality: N/A;   Family History  Problem Relation Age of Onset  . Diabetes Mother   . Hypertension Father    History  Substance Use Topics  . Smoking status: Never Smoker   . Smokeless tobacco: Never Used  . Alcohol Use: No     Comment: Occasional, once a month mixed drink- when not pregnant   OB History    Gravida Para Term Preterm AB TAB SAB Ectopic Multiple Living   1              Review of Systems   Ten systems reviewed and are negative for acute change, except as noted in the HPI.   Allergies  Peanut-containing drug products  Home Medications   Prior to Admission medications   Medication Sig Start Date End Date Taking? Authorizing Provider  acetaminophen (TYLENOL) 500 MG tablet Take 1,000 mg by mouth every 6 (six) hours as needed.   Yes Historical Provider, MD  b complex vitamins capsule Take 1 capsule by mouth daily.   Yes Historical Provider, MD  Cholecalciferol (VITAMIN D PO) Take 1 tablet by mouth daily.   Yes Historical Provider, MD  Doxylamine-Pyridoxine 10-10 MG TBEC Start with 2 tablets every evening. If symptoms persist, add 1 tablet every morning. If symptoms  persist, add 1 tablet at mid-day. Patient taking differently: Take 2 tablets by mouth at bedtime.  07/20/14  Yes Dorathy Kinsman, CNM  EPINEPHrine (EPIPEN 2-PAK) 0.3 mg/0.3 mL IJ SOAJ injection Inject 0.3 mLs (0.3 mg total) into the muscle once. 08/10/14  Yes Rachelle A Denney, CNM  glucose 4 GM chewable tablet Chew 1 tablet (4 g total) by mouth as needed for low blood sugar. 08/10/14  Yes Rachelle A Denney, CNM  insulin detemir (LEVEMIR) 100 UNIT/ML injection Inject 14 Units into the skin 2 (two) times daily.   Yes Historical Provider, MD  insulin lispro (HUMALOG) 100 UNIT/ML injection by  Continuous infusion (non-IV) route every hour. Pt administers thru insulin pump, additional taken with carbs at mealtimes   Yes Historical Provider, MD  ketotifen (ZADITOR) 0.025 % ophthalmic solution Place 1 drop into both eyes 2 (two) times daily as needed (dry eyes).   Yes Historical Provider, MD  Prenatal Vit-Fe Fumarate-FA (PRENATAL MULTIVITAMIN) TABS tablet Take 1 tablet by mouth daily at 12 noon.   Yes Historical Provider, MD  promethazine (PHENERGAN) 25 MG tablet Take 1 tablet (25 mg total) by mouth every 6 (six) hours as needed. Patient taking differently: Take 25 mg by mouth every 6 (six) hours as needed for nausea.  07/20/14  Yes Dorathy Kinsman, CNM  terconazole (TERAZOL 7) 0.4 % vaginal cream Place 1 applicator vaginally at bedtime. 08/23/14  Yes Rachelle A Denney, CNM   BP 132/86 mmHg  Pulse 101  Temp(Src) 98.2 F (36.8 C) (Oral)  Resp 16  SpO2 100%  LMP 05/26/2014 Physical Exam  Constitutional: She is oriented to person, place, and time. She appears well-developed and well-nourished. No distress.  HENT:  Head: Normocephalic and atraumatic.  Eyes: Conjunctivae are normal. No scleral icterus.  Neck: Normal range of motion.  Cardiovascular: Normal rate, regular rhythm and normal heart sounds.  Exam reveals no gallop and no friction rub.   No murmur heard. Pulmonary/Chest: Effort normal and breath sounds normal. No respiratory distress.  Abdominal: Soft. Bowel sounds are normal. She exhibits no distension and no mass. There is no tenderness. There is no guarding.  Neurological: She is alert and oriented to person, place, and time.  Speech is clear and goal oriented, follows commands Major Cranial nerves without deficit, no facial droop Normal strength in upper and lower extremities bilaterally including dorsiflexion and plantar flexion, strong and equal grip strength Sensation normal to light and sharp touch Moves extremities without ataxia, coordination intact Normal finger to  nose and rapid alternating movements Neg romberg, no pronator drift Normal gait Normal heel-shin and balance   Skin: Skin is warm and dry. She is not diaphoretic.  Nursing note and vitals reviewed.   ED Course  Procedures (including critical care time) Labs Review Labs Reviewed  CBG MONITORING, ED - Abnormal; Notable for the following:    Glucose-Capillary 153 (*)    All other components within normal limits    Imaging Review No results found.   EKG Interpretation None      MDM   Final diagnoses:  Myalgia    Patient with blood sugar of 152. Pulse is slightly elevated, however, she has pain. There is no concern for PE, no shortness of breath or pleuritic chest pain. Patient given narcotic pain medication to take for severe pain. She understands that this is possibly she was taking on a regular basis, especially with pregnancy. She is able to walk. No neurologic symptoms. Patient will follow up with her OB/GYN  provider. I discussed the case with Dr. Deretha Emory who agrees with plan of care   Arthor Captain, PA-C 09/06/14 1025  Vanetta Mulders, MD 09/07/14 [phone]

## 2014-09-07 ENCOUNTER — Encounter: Payer: Self-pay | Admitting: Certified Nurse Midwife

## 2014-09-12 ENCOUNTER — Other Ambulatory Visit: Payer: Self-pay | Admitting: Certified Nurse Midwife

## 2014-09-15 ENCOUNTER — Ambulatory Visit (INDEPENDENT_AMBULATORY_CARE_PROVIDER_SITE_OTHER): Payer: 59 | Admitting: Certified Nurse Midwife

## 2014-09-15 VITALS — BP 122/78 | HR 111 | Temp 98.6°F | Wt 173.0 lb

## 2014-09-15 DIAGNOSIS — O0992 Supervision of high risk pregnancy, unspecified, second trimester: Secondary | ICD-10-CM

## 2014-09-15 LAB — POCT URINALYSIS DIPSTICK
Bilirubin, UA: NEGATIVE
Blood, UA: NEGATIVE
Glucose, UA: 1000
Leukocytes, UA: NEGATIVE
Nitrite, UA: NEGATIVE
Protein, UA: NEGATIVE
Spec Grav, UA: 1.01
Urobilinogen, UA: NEGATIVE
pH, UA: 5

## 2014-09-15 MED ORDER — OB COMPLETE GOLD 27.5-1-200 MG PO CAPS: 1.0000 | ORAL_CAPSULE | Freq: Every day | ORAL | Status: DC

## 2014-09-15 MED ORDER — ASPIRIN 81 MG PO CHEW: 81.0000 mg | CHEWABLE_TABLET | Freq: Every day | ORAL | Status: DC

## 2014-09-15 NOTE — Progress Notes (Signed)
  Subjective:    [name] is a 24 y.o. female being seen today for her obstetrical visit. She is at [redacted]w[redacted]d gestation. Patient reports: fatigue, no bleeding, no contractions, no cramping and no leaking.  Problem List Items Addressed This Visit    None    Visit Diagnoses    Supervision of high risk pregnancy, antepartum, second trimester    -  Primary    Relevant Medications    aspirin 81 MG chewable tablet    Prenat w/o A-FeCbn-Meth-FA-DHA (OB COMPLETE GOLD) 27.5-1-200 MG CAPS    Other Relevant Orders    Alpha fetoprotein, maternal    POCT urinalysis dipstick (Completed)      Patient Active Problem List   Diagnosis Date Noted  . Encounter for (NT) nuchal translucency scan   . First trimester screening   . [redacted] weeks gestation of pregnancy   . Pre-existing type 1 diabetes mellitus during pregnancy in first trimester   . Hypertension in pregnancy   . DKA (diabetic ketoacidoses) 06/11/2013  . DKA, type 1 06/11/2013  . Orthostatic hypotension dysautonomic syndrome 04/28/2013  . Body aches 04/28/2013  . Orthostatic hypotension 04/28/2013  . Diabetes mellitus type 1 04/28/2013  . Protein-calorie malnutrition, severe 04/28/2013  . Noncompliance with diabetes treatment 09/01/2012  . Epigastric abdominal pain 03/15/2012  . Dyspnea 02/28/2012  . Sinus tachycardia 02/28/2012  . Hyperglycemia without ketosis 02/28/2012  . Chest pain 12/04/2011  . Diabetes type 1, uncontrolled 11/14/2011  . Hypertension associated with diabetes 11/14/2011  . Gastroparesis 11/14/2011  . Hyponatremia 09/19/2011  . Abdominal pain 09/18/2011  . Metabolic alkalosis 09/18/2011  . Diabetic hyperosmolar non-ketotic state 09/18/2011  . DIAB W/UNSPEC COMP TYPE I [JUV TYPE] UNCNTRL 06/04/2010  . CONSTIPATION 01/01/2010  . RECTAL PAIN 01/01/2010    Objective:     BP 122/78 mmHg  Pulse 111  Temp(Src) 98.6 F (37 C)  Wt 173 lb (78.472 kg)  LMP 05/26/2014 Uterine Size: Below umbilicus   FHR:  150's  Assessment:    Pregnancy @ [redacted]w[redacted]d  weeks Doing well    Fatigue d/t pregnancy  Plan:    Problem list reviewed and updated. Is having insurance/income issues that are being worked out.  Labs reviewed. MSAFP drawn today Follow up in 4 weeks. MFM recommendations reviewed with patient and spouse.  FIRST/CF mutation testing/NIPT/QUAD SCREEN/fragile X/Ashkenazi Jewish population testing/Spinal muscular atrophy discussed: ordered. Role of ultrasound in pregnancy discussed; fetal survey: requested. Amniocentesis discussed: not indicated. 50% of 30 minute visit spent on counseling and coordination of care.

## 2014-09-20 LAB — ALPHA FETOPROTEIN, MATERNAL
AFP: 21.3 ng/mL
Curr Gest Age: 16 wks.days
MoM for AFP: 0.6
Open Spina bifida: NEGATIVE
Osb Risk: <1:54600 {titer}

## 2014-10-03 ENCOUNTER — Other Ambulatory Visit: Payer: Self-pay | Admitting: Certified Nurse Midwife

## 2014-10-04 ENCOUNTER — Other Ambulatory Visit (HOSPITAL_COMMUNITY): Payer: Self-pay | Admitting: Obstetrics and Gynecology

## 2014-10-04 DIAGNOSIS — O24312 Unspecified pre-existing diabetes mellitus in pregnancy, second trimester: Secondary | ICD-10-CM

## 2014-10-04 DIAGNOSIS — O10012 Pre-existing essential hypertension complicating pregnancy, second trimester: Secondary | ICD-10-CM

## 2014-10-05 ENCOUNTER — Ambulatory Visit (HOSPITAL_COMMUNITY)
Admission: RE | Admit: 2014-10-05 | Discharge: 2014-10-05 | Disposition: A | Discharge status: Home / Self Care | Payer: 59 | Source: Ambulatory Visit | Attending: Obstetrics | Admitting: Obstetrics

## 2014-10-05 ENCOUNTER — Ambulatory Visit (HOSPITAL_COMMUNITY)
Admission: RE | Admit: 2014-10-05 | Discharge: 2014-10-05 | Disposition: A | Discharge status: Home / Self Care | Payer: 59 | Source: Ambulatory Visit | Attending: Certified Nurse Midwife | Admitting: Certified Nurse Midwife

## 2014-10-05 DIAGNOSIS — O289 Unspecified abnormal findings on antenatal screening of mother: Secondary | ICD-10-CM | POA: Insufficient documentation

## 2014-10-05 DIAGNOSIS — O09899 Supervision of other high risk pregnancies, unspecified trimester: Secondary | ICD-10-CM | POA: Insufficient documentation

## 2014-10-05 DIAGNOSIS — O351XX0 Maternal care for (suspected) chromosomal abnormality in fetus, not applicable or unspecified: Secondary | ICD-10-CM | POA: Diagnosis not present

## 2014-10-05 DIAGNOSIS — Z3689 Encounter for other specified antenatal screening: Secondary | ICD-10-CM | POA: Insufficient documentation

## 2014-10-05 DIAGNOSIS — Z315 Encounter for genetic counseling: Secondary | ICD-10-CM | POA: Diagnosis not present

## 2014-10-05 DIAGNOSIS — O24312 Unspecified pre-existing diabetes mellitus in pregnancy, second trimester: Secondary | ICD-10-CM

## 2014-10-05 DIAGNOSIS — O10012 Pre-existing essential hypertension complicating pregnancy, second trimester: Secondary | ICD-10-CM

## 2014-10-05 DIAGNOSIS — Z3A18 18 weeks gestation of pregnancy: Secondary | ICD-10-CM | POA: Insufficient documentation

## 2014-10-05 DIAGNOSIS — O280 Abnormal hematological finding on antenatal screening of mother: Secondary | ICD-10-CM | POA: Insufficient documentation

## 2014-10-05 NOTE — Progress Notes (Signed)
Genetic Counseling  High-Risk Gestation Note  Appointment Date:  10/05/2014 Referred By: Roe Coombs, CNM Date of Birth:  02/19/1991 Partner:  [name], [name]    Pregnancy History: G1P0 Estimated Date of Delivery: 03/02/15 Estimated Gestational Age: [redacted]w[redacted]d Attending: Alpha Gula, MD    Mrs. [name] and her husband, Mr. [name], [name], were seen for genetic counseling because of an increased risk for fetal Down syndrome based on First trimester screening through [facility].  In Summary:   1:122 (0.8%) Down syndrome risk from First trimester screening  Patient elected detailed ultrasound only today; declined NIPS and amniocentesis  Low PAPP-A (0.13 MoM) on first screen, associated with increased risk adverse pregnancy outcome   Follow-up ultrasound scheduled in third trimester   They were counseled regarding the First trimester screen result and the associated 1 in 122 (0.8%) risk for fetal Down syndrome.  We reviewed chromosomes, nondisjunction, and the common features and variable prognosis of Down syndrome.  In addition, we reviewed the screen negative risks for trisomy 18/13 (1 in 1754 to 1 in 748).  We also discussed other explanations for a screen positive result including: differences in maternal metabolism and normal variation. They understand that this screening is not diagnostic for Down syndrome but provides a risk assessment.  We specifically discussed that the level of one of the proteins analyzed on the first trimester screen, PAPP-A, was very low (0.13 MoM).  This has been associated with an increased risk for adverse pregnancy outcome including growth restriction and preeclampsia later in pregnancy; therefore, we would recommend a follow up ultrasound for fetal growth in the third trimester.  We reviewed available screening options including noninvasive prenatal screening (NIPS)/cell free DNA (cfDNA) testing, and detailed ultrasound.  They  were counseled that screening tests are used to modify a patient's a priori risk for aneuploidy, typically based on age. This estimate provides a pregnancy specific risk assessment. We reviewed the benefits and limitations of each option. Specifically, we discussed the conditions for which each test screens, the detection rates, and false positive rates of each. They were also counseled regarding diagnostic testing via amniocentesis. We reviewed the approximate 1 in 300-500 risk for complications for amniocentesis, including spontaneous pregnancy loss. After careful consideration, they elected to proceed with targeted ultrasound only today and declined NIPS and amniocentesis.   A complete ultrasound was performed today. The ultrasound report will be under separate cover. There were no visualized fetal anomalies or markers suggestive of aneuploidy. They understand that screening tests cannot rule out all birth defects or genetic syndromes. The patient was advised of this limitation and states she still does not want additional testing at this time.   Mrs. [name] was provided with written information regarding sickle cell anemia (SCA) including the carrier frequency and incidence in the Hispanic and African-American population, the availability of carrier testing and prenatal diagnosis if indicated. In addition, we discussed that hemoglobinopathies are routinely screened for as part of the [facility] newborn screening panel. Hemoglobin electrophoresis was previously performed and indicated the presence of normal adult hemoglobin.   Both family histories were reviewed and found to be contributory for [name] wearing special shoes when he was younger, possibly because his feet turned in. Mr. [name] had limited information regarding this history. Additionally, Mrs. [name] reported that her [name] and her father wore braces on their legs/feet when younger because their feet turned in. They are otherwise healthy,  and none of these individuals required surgical  correction. There are various causes for positional differences of lower extremities. In the case of multiple individuals in a family with similar features, recurrence risk may be increased above the general population risk. However, additional information is needed regarding the specific features and cause for each individual in order to more accurately assess potential implications for relatives. Without further information regarding the provided family history, an accurate genetic risk cannot be calculated. Further genetic counseling is warranted if more information is obtained.  Mrs. [name] denied exposure to environmental toxins or chemical agents. She denied the use of alcohol, tobacco or street drugs. She denied significant viral illnesses during the course of her pregnancy. Her medical and surgical histories were contributory for type I diabetes. She reported that she is being seen frequently by her endocrinologist during the pregnancy and previously saw the diabetic educator. Women who have insulin dependent diabetes are at an increased risk to have a baby with a birth defect.  The increase in risk correlates with the level of blood sugar control during the pregnancy, particularly during organogenesis.  The increase in risk is for any type of birth defect but is greatest for heart, limb, and neural tube defects.  The risk could be as high as 6-10% for individuals whose blood sugars are not well-controlled, but lower for women who have good blood sugar control throughout pregnancy.   I counseled this couple for approximately 40 minutes regarding the above risks and available options.   Quinn Plowman, MS,  Certified Genetic Counselor 10/05/2014

## 2014-10-13 ENCOUNTER — Ambulatory Visit: Payer: Self-pay | Admitting: Certified Nurse Midwife

## 2014-10-17 ENCOUNTER — Ambulatory Visit: Payer: 59 | Admitting: Certified Nurse Midwife

## 2014-10-17 ENCOUNTER — Ambulatory Visit (INDEPENDENT_AMBULATORY_CARE_PROVIDER_SITE_OTHER): Payer: 59 | Admitting: Certified Nurse Midwife

## 2014-10-17 VITALS — BP 115/74 | HR 106 | Wt 185.0 lb

## 2014-10-17 DIAGNOSIS — E119 Type 2 diabetes mellitus without complications: Secondary | ICD-10-CM

## 2014-10-17 DIAGNOSIS — Z794 Long term (current) use of insulin: Secondary | ICD-10-CM

## 2014-10-17 DIAGNOSIS — O09892 Supervision of other high risk pregnancies, second trimester: Secondary | ICD-10-CM

## 2014-10-17 DIAGNOSIS — O0992 Supervision of high risk pregnancy, unspecified, second trimester: Secondary | ICD-10-CM

## 2014-10-17 LAB — POCT URINALYSIS DIPSTICK
Bilirubin, UA: NEGATIVE
Blood, UA: NEGATIVE
Glucose, UA: 500
Ketones, UA: NEGATIVE
Leukocytes, UA: NEGATIVE
Nitrite, UA: NEGATIVE
Protein, UA: NEGATIVE
Spec Grav, UA: 1.015
Urobilinogen, UA: NEGATIVE
pH, UA: 5

## 2014-10-17 LAB — GLUCOSE, POCT (MANUAL RESULT ENTRY): POC Glucose: 122 mg/dl — AB (ref 70–99)

## 2014-10-17 NOTE — Progress Notes (Signed)
Patient ID: [name], female   DOB: 03-03-1991, 24 y.o.   MRN: [id number]  Subjective:    [name] is a 24 y.o. female being seen today for her obstetrical visit. She is at [redacted]w[redacted]d gestation. Patient reports: no complaints . Fetal movement: normal.  Discussed counseling options in office if needed for stress that is currently going on, moving, husbands new job, etc.    Problem List Items Addressed This Visit    None    Visit Diagnoses    Supervision of other high risk pregnancy, antepartum, second trimester    -  Primary    Relevant Orders    US Fetal Echocardiography    AMB referral to maternal fetal medicine    IDDM (insulin dependent diabetes mellitus)        Relevant Orders    US Fetal Echocardiography    AMB referral to maternal fetal medicine      Patient Active Problem List   Diagnosis Date Noted  . Abnormal maternal serum screening test 10/05/2014  . High risk pregnancy with low PAPPA 10/05/2014  . Pre-existing essential hypertension complicating pregnancy in second trimester   . Abnormal first trimester screen   . Encounter for fetal anatomic survey   . [redacted] weeks gestation of pregnancy   . Encounter for (NT) nuchal translucency scan   . First trimester screening   . [redacted] weeks gestation of pregnancy   . Pre-existing type 1 diabetes mellitus during pregnancy in first trimester   . Hypertension in pregnancy   . DKA (diabetic ketoacidoses) 06/11/2013  . DKA, type 1 06/11/2013  . Orthostatic hypotension dysautonomic syndrome 04/28/2013  . Body aches 04/28/2013  . Orthostatic hypotension 04/28/2013  . Diabetes mellitus type 1 04/28/2013  . Protein-calorie malnutrition, severe 04/28/2013  . Noncompliance with diabetes treatment 09/01/2012  . Epigastric abdominal pain 03/15/2012  . Dyspnea 02/28/2012  . Sinus tachycardia 02/28/2012  . Hyperglycemia without ketosis 02/28/2012  . Chest pain 12/04/2011  . Diabetes type 1, uncontrolled 11/14/2011  . Hypertension  associated with diabetes 11/14/2011  . Gastroparesis 11/14/2011  . Hyponatremia 09/19/2011  . Abdominal pain 09/18/2011  . Metabolic alkalosis 09/18/2011  . Diabetic hyperosmolar non-ketotic state 09/18/2011  . DIAB W/UNSPEC COMP TYPE I [JUV TYPE] UNCNTRL 06/04/2010  . CONSTIPATION 01/01/2010  . RECTAL PAIN 01/01/2010   Objective:    LMP 05/26/2014 FHT: 135 BPM  Uterine Size: size equals dates and at U     Assessment:    Pregnancy @ [redacted]w[redacted]d    IDDM Doing well  Plan:    Signs and symptoms of preterm labor: discussed. Discussed keeping blood sugar logs. MFM consult ordered.   Fetal Echo already scheduled Labs, problem list reviewed and updated Follow up in 3 weeks.

## 2014-10-17 NOTE — Addendum Note (Signed)
Addended by: Henriette Combs on: 10/17/2014 05:13 PM   Modules accepted: Orders

## 2014-10-17 NOTE — Progress Notes (Signed)
Subjective:    [name] is a 24 y.o. female being seen today for her obstetrical visit. She is at [redacted]w[redacted]d gestation. Patient reports: no complaints . Fetal movement: normal.  Problem List Items Addressed This Visit    None    Visit Diagnoses    Encounter for supervision of normal first pregnancy in second trimester    -  Primary    Relevant Orders    POCT urinalysis dipstick      Patient Active Problem List   Diagnosis Date Noted  . Abnormal maternal serum screening test 10/05/2014  . High risk pregnancy with low PAPPA 10/05/2014  . Pre-existing essential hypertension complicating pregnancy in second trimester   . Abnormal first trimester screen   . Encounter for fetal anatomic survey   . [redacted] weeks gestation of pregnancy   . Encounter for (NT) nuchal translucency scan   . First trimester screening   . [redacted] weeks gestation of pregnancy   . Pre-existing type 1 diabetes mellitus during pregnancy in first trimester   . Hypertension in pregnancy   . DKA (diabetic ketoacidoses) 06/11/2013  . DKA, type 1 06/11/2013  . Orthostatic hypotension dysautonomic syndrome 04/28/2013  . Body aches 04/28/2013  . Orthostatic hypotension 04/28/2013  . Diabetes mellitus type 1 04/28/2013  . Protein-calorie malnutrition, severe 04/28/2013  . Noncompliance with diabetes treatment 09/01/2012  . Epigastric abdominal pain 03/15/2012  . Dyspnea 02/28/2012  . Sinus tachycardia 02/28/2012  . Hyperglycemia without ketosis 02/28/2012  . Chest pain 12/04/2011  . Diabetes type 1, uncontrolled 11/14/2011  . Hypertension associated with diabetes 11/14/2011  . Gastroparesis 11/14/2011  . Hyponatremia 09/19/2011  . Abdominal pain 09/18/2011  . Metabolic alkalosis 09/18/2011  . Diabetic hyperosmolar non-ketotic state 09/18/2011  . DIAB W/UNSPEC COMP TYPE I [JUV TYPE] UNCNTRL 06/04/2010  . CONSTIPATION 01/01/2010  . RECTAL PAIN 01/01/2010   Objective:    BP 115/74 mmHg  Pulse 106  Wt 185 lb (83.915  kg)  LMP 05/26/2014 FHT: 135 BPM  Uterine Size: size equals dates     Assessment:    Pregnancy @ [redacted]w[redacted]d    IDDM  Plan:    Signs and symptoms of preterm labor: discussed.  Labs, problem list reviewed and updated Follow up in 3 weeks.

## 2014-10-28 ENCOUNTER — Inpatient Hospital Stay (HOSPITAL_COMMUNITY)
Admission: AD | Admit: 2014-10-28 | Discharge: 2014-10-28 | Disposition: A | Discharge status: Home / Self Care | Payer: 59 | Source: Ambulatory Visit | Attending: Obstetrics | Admitting: Obstetrics

## 2014-10-28 ENCOUNTER — Encounter (HOSPITAL_COMMUNITY): Payer: Self-pay | Admitting: *Deleted

## 2014-10-28 DIAGNOSIS — Z3A22 22 weeks gestation of pregnancy: Secondary | ICD-10-CM | POA: Diagnosis not present

## 2014-10-28 DIAGNOSIS — O26852 Spotting complicating pregnancy, second trimester: Secondary | ICD-10-CM | POA: Diagnosis not present

## 2014-10-28 DIAGNOSIS — B373 Candidiasis of vulva and vagina: Secondary | ICD-10-CM | POA: Insufficient documentation

## 2014-10-28 DIAGNOSIS — O24012 Pre-existing diabetes mellitus, type 1, in pregnancy, second trimester: Secondary | ICD-10-CM | POA: Diagnosis not present

## 2014-10-28 DIAGNOSIS — E109 Type 1 diabetes mellitus without complications: Secondary | ICD-10-CM | POA: Diagnosis not present

## 2014-10-28 DIAGNOSIS — B3731 Acute candidiasis of vulva and vagina: Secondary | ICD-10-CM

## 2014-10-28 DIAGNOSIS — N939 Abnormal uterine and vaginal bleeding, unspecified: Secondary | ICD-10-CM

## 2014-10-28 DIAGNOSIS — O10912 Unspecified pre-existing hypertension complicating pregnancy, second trimester: Secondary | ICD-10-CM | POA: Insufficient documentation

## 2014-10-28 DIAGNOSIS — O98812 Other maternal infectious and parasitic diseases complicating pregnancy, second trimester: Secondary | ICD-10-CM | POA: Insufficient documentation

## 2014-10-28 LAB — URINALYSIS, ROUTINE W REFLEX MICROSCOPIC
Bilirubin Urine: NEGATIVE
Glucose, UA: NEGATIVE mg/dL
Hgb urine dipstick: NEGATIVE
Ketones, ur: NEGATIVE mg/dL
Leukocytes, UA: NEGATIVE
Nitrite: NEGATIVE
Protein, ur: NEGATIVE mg/dL
Specific Gravity, Urine: >1.03 — ABNORMAL HIGH (ref 1.005–1.030)
Urobilinogen, UA: 0.2 mg/dL (ref 0.0–1.0)
pH: 6 (ref 5.0–8.0)

## 2014-10-28 LAB — OB RESULTS CONSOLE GC/CHLAMYDIA
Chlamydia: NEGATIVE
Gonorrhea: NEGATIVE

## 2014-10-28 LAB — WET PREP, GENITAL
Clue Cells Wet Prep HPF POC: NONE SEEN
Trich, Wet Prep: NONE SEEN

## 2014-10-28 MED ORDER — TERCONAZOLE 0.4 % VA CREA: 1.0000 | TOPICAL_CREAM | Freq: Every day | VAGINAL | Status: DC

## 2014-10-28 NOTE — MAU Provider Note (Signed)
Chief Complaint:  Vaginal Bleeding   None     HPI: [name] is a 24 y.o. G1P0 at [redacted]w[redacted]d pt with Type 1 DM and chronic HTN in pregnancy who presents to maternity admissions reporting pink spotting when wiping before coming to MAU today.  She denies spotting with last restroom visit. She also reports some recent vaginal itching/irritation x 2-3 days. She has not taken any medications for itching. She denies abdominal or back pain.   She reports good fetal movement, denies LOF, urinary symptoms, h/a, dizziness, n/v, or fever/chills.  Anatomy U/S on 7/7 by MFM was wnl.    Vaginal Bleeding The patient's primary symptoms include vaginal bleeding. The patient's pertinent negatives include no pelvic pain. This is a new problem. The current episode started today. The problem occurs intermittently. The problem has been resolved. The patient is experiencing no pain. She is pregnant. Associated symptoms include constipation. Pertinent negatives include no abdominal pain, back pain, chills, diarrhea, dysuria, fever, flank pain, frequency, headaches, nausea, urgency or vomiting. The vaginal bleeding is spotting. She has not been passing clots. She has not been passing tissue. Nothing aggravates the symptoms. She has tried nothing for the symptoms. No, her partner does not have an STD.    Past Medical History: Past Medical History  Diagnosis Date  . Diabetes mellitus   . Hypertension   . Diabetes type 1, uncontrolled 11/14/2011    Since age 58  . Gastroparesis   . Anxiety   . Infection     UTI April 2016  . Fibromyalgia     Past obstetric history: OB History  Gravida Para Term Preterm AB SAB TAB Ectopic Multiple Living  1             # Outcome Date GA Lbr Len/2nd Weight Sex Delivery Anes PTL Lv  1 Current               Past Surgical History: Past Surgical History  Procedure Laterality Date  . Ankle surgery    . Cholecystectomy  11/15/2011    Procedure: LAPAROSCOPIC CHOLECYSTECTOMY WITH  INTRAOPERATIVE CHOLANGIOGRAM;  Surgeon: [name], MD;  Location: [facility];  Service: General;  Laterality: N/A;  . Laparoscopy  11/23/2011    Procedure: LAPAROSCOPY DIAGNOSTIC;  Surgeon: Mariella Saa, MD;  Location: [facility];  Service: General;  Laterality: N/A;  . Esophagogastroduodenoscopy  12/03/2011    Procedure: ESOPHAGOGASTRODUODENOSCOPY (EGD);  Surgeon: Theda Belfast, MD;  Location: [facility];  Service: Endoscopy;  Laterality: N/A;    Family History: Family History  Problem Relation Age of Onset  . Diabetes Mother   . Hypertension Father     Social History: History  Substance Use Topics  . Smoking status: Never Smoker   . Smokeless tobacco: Never Used  . Alcohol Use: No     Comment: Occasional, once a month mixed drink- when not pregnant    Allergies:  Allergies  Allergen Reactions  . Peanut-Containing Drug Products Anaphylaxis and Other (See Comments)    Pt states that she is allergic to [place] nuts.        Meds:  No prescriptions prior to admission    Review of Systems  Constitutional: Negative for fever, chills and malaise/fatigue.  Eyes: Negative for blurred vision.  Respiratory: Negative for cough and shortness of breath.   Cardiovascular: Negative for chest pain.  Gastrointestinal: Positive for constipation. Negative for heartburn, nausea, vomiting, abdominal pain and diarrhea.  Genitourinary: Positive for vaginal bleeding. Negative for  dysuria, urgency, frequency, flank pain and pelvic pain.  Musculoskeletal: Negative.  Negative for back pain.  Neurological: Negative for dizziness and headaches.  Psychiatric/Behavioral: Negative for depression.    Physical Exam  Blood pressure 112/67, pulse 96, temperature 97.9 F (36.6 C), resp. rate 20, height $Rem (1.702 m), weight 85.186 kg (187 lb 12.8 oz), last menstrual period 05/26/2014. GENERAL: Well-developed, well-nourished female in no acute distress.  EYES: normal sclera/conjunctiva; no  lid-lag HENT: Atraumatic, normocephalic HEART: normal rate RESP: normal effort ABDOMEN: Soft, non-tender MUSCULOSKELETAL: Normal ROM EXTREMITIES: Nontender, no edema NEURO/PSYCH: Alert and oriented, appropriate affect  PELVIC EXAM: Cervix pink, visually closed, friable with cotton swab, scant white creamy discharge, vaginal walls and external genitalia with mild erythema Bimanual exam: Cervix 0/long/high  Dilation: Closed Exam by:: Isaias Sakai CNM  FHT:  Present by doppler   Labs: Results for orders placed or performed during the hospital encounter of 10/28/14 (from the past 24 hour(s))  Urinalysis, Routine w reflex microscopic (not at [facility])     Status: Abnormal   Collection Time: 10/28/14  9:05 PM  Result Value Ref Range   Color, Urine YELLOW YELLOW   APPearance CLEAR CLEAR   Specific Gravity, Urine >1.030 (H) 1.005 - 1.030   pH 6.0 5.0 - 8.0   Glucose, UA NEGATIVE NEGATIVE mg/dL   Hgb urine dipstick NEGATIVE NEGATIVE   Bilirubin Urine NEGATIVE NEGATIVE   Ketones, ur NEGATIVE NEGATIVE mg/dL   Protein, ur NEGATIVE NEGATIVE mg/dL   Urobilinogen, UA 0.2 0.0 - 1.0 mg/dL   Nitrite NEGATIVE NEGATIVE   Leukocytes, UA NEGATIVE NEGATIVE  Wet prep, genital     Status: Abnormal   Collection Time: 10/28/14 10:05 PM  Result Value Ref Range   Yeast Wet Prep HPF POC FEW (A) NONE SEEN   Trich, Wet Prep NONE SEEN NONE SEEN   Clue Cells Wet Prep HPF POC NONE SEEN NONE SEEN   WBC, Wet Prep HPF POC MODERATE (A) NONE SEEN   ED Course Ordered and reviewed labs and performed pelvic exam.  Cervix with significant friability, making likely source of bleeding from cervix.  FHT normal by doppler.  Will treat vaginal yeast r/t symptoms/exam.  Pt stable at time of discharge.   Assessment: 1. Vaginal candidiasis   2. Vaginal spotting     Plan: Discharge home with bleeding precautions PTL precautions and fetal kick counts Terazol 7 Q HS x 7 days      Follow-up Information     Follow up with HARPER,CHARLES A, MD.   Specialty:  Obstetrics and Gynecology   Why:  As scheduled   Contact information:   [address] [place] [address] [phone]       Follow up with [facility] [place] [facility]ERN[facility] ADMISS[facility].   Why:  As needed for emergencies   Contact information:   [address] [id number] mc [place] [address] [phone]       Medication List    STOP taking these medications        HYDROcodone-acetaminophen 5-325 MG per tablet  Commonly known as:  NORCO/VICODIN      TAKE these medications        acetaminophen 500 MG tablet  Commonly known as:  TYLENOL  Take 1,000 mg by mouth every 6 (six) hours as needed for mild pain or headache.     aspirin 81 MG chewable tablet  Chew 1 tablet (81 mg total) by mouth daily.  doxylamine (Sleep) 25 MG tablet  Commonly known as:  UNISOM  Take 25-50 mg by mouth at bedtime as needed for sleep.     Doxylamine-Pyridoxine 10-10 MG Tbec  Start with 2 tablets every evening. If symptoms persist, add 1 tablet every morning. If symptoms persist, add 1 tablet at mid-day.     EPINEPHrine 0.3 mg/0.3 mL Soaj injection  Commonly known as:  EPIPEN 2-PAK  Inject 0.3 mLs (0.3 mg total) into the muscle once.     glucose 4 GM chewable tablet  Chew 1 tablet (4 g total) by mouth as needed for low blood sugar.     insulin detemir 100 UNIT/ML injection  Commonly known as:  LEVEMIR  Inject 14 Units into the skin 2 (two) times daily.     insulin lispro 100 UNIT/ML injection  Commonly known as:  HUMALOG  Inject 2-12 Units into the skin 3 (three) times daily as needed for high blood sugar. Pt uses as needed per sliding scale.     ketotifen 0.025 % ophthalmic solution  Commonly known as:  ZADITOR  Place 1 drop into both eyes 2 (two) times daily as needed (dry eyes).     OB COMPLETE GOLD 27.5-1-200 MG Caps  Take 1 tablet by mouth daily.     promethazine 25 MG  tablet  Commonly known as:  PHENERGAN  Take 1 tablet (25 mg total) by mouth every 6 (six) hours as needed.     terconazole 0.4 % vaginal cream  Commonly known as:  TERAZOL 7  Place 1 applicator vaginally at bedtime.     VITAMIN D PO  Take 2 each by mouth daily.        Sharen Counter Certified Nurse-Midwife 10/29/2014 1:13 AM

## 2014-10-28 NOTE — Discharge Instructions (Signed)
Yeast Vaginitis °Vaginitis in a soreness, swelling and redness (inflammation) of the vagina and vulva. Monilial vaginitis is not a sexually transmitted infection. °CAUSES  °Yeast vaginitis is caused by yeast (candida) that is normally found in your vagina. With a yeast infection, the candida has overgrown in number to a point that upsets the chemical balance. °SYMPTOMS  °· White, thick vaginal discharge. °· Swelling, itching, redness and irritation of the vagina and possibly the lips of the vagina (vulva). °· Burning or painful urination. °· Painful intercourse. °DIAGNOSIS  °Things that may contribute to monilial vaginitis are: °· Postmenopausal and virginal states. °· Pregnancy. °· Infections. °· Being tired, sick or stressed, especially if you had monilial vaginitis in the past. °· Diabetes. Good control will help lower the chance. °· Birth control pills. °· Tight fitting garments. °· Using bubble bath, feminine sprays, douches or deodorant tampons. °· Taking certain medications that kill germs (antibiotics). °· Sporadic recurrence can occur if you become ill. °TREATMENT  °Your caregiver will give you medication. °· There are several kinds of anti monilial vaginal creams and suppositories specific for monilial vaginitis. For recurrent yeast infections, use a suppository or cream in the vagina 2 times a week, or as directed. °· Anti-monilial or steroid cream for the itching or irritation of the vulva may also be used. Get your caregiver's permission. °· Painting the vagina with methylene blue solution may help if the monilial cream does not work. °· Eating yogurt may help prevent monilial vaginitis. °HOME CARE INSTRUCTIONS  °· Finish all medication as prescribed. °· Do not have sex until treatment is completed or after your caregiver tells you it is okay. °· Take warm sitz baths. °· Do not douche. °· Do not use tampons, especially scented ones. °· Wear cotton underwear. °· Avoid tight pants and panty hose. °· Tell  your sexual partner that you have a yeast infection. They should go to their caregiver if they have symptoms such as mild rash or itching. °· Your sexual partner should be treated as well if your infection is difficult to eliminate. °· Practice safer sex. Use condoms. °· Some vaginal medications cause latex condoms to fail. Vaginal medications that harm condoms are: °¨ Cleocin cream. °¨ Butoconazole (Femstat®). °¨ Terconazole (Terazol®) vaginal suppository. °¨ Miconazole (Monistat®) (may be purchased over the counter). °SEEK MEDICAL CARE IF:  °· You have a temperature by mouth above 102° F (38.9° C). °· The infection is getting worse after 2 days of treatment. °· The infection is not getting better after 3 days of treatment. °· You develop blisters in or around your vagina. °· You develop vaginal bleeding, and it is not your menstrual period. °· You have pain when you urinate. °· You develop intestinal problems. °· You have pain with sexual intercourse. °Document Released: 12/25/2004 Document Revised: 06/09/2011 Document Reviewed: 09/08/2008 °ExitCare® Patient Information ©2015 [employer]. This information is not intended to replace advice given to you by your health care provider. Make sure you discuss any questions you have with your health care provider. ° °

## 2014-10-28 NOTE — MAU Note (Signed)
Went to [facility] about 2000 and saw slight pink on tissue. No pain. No intercourse in last 24hrs.

## 2014-10-30 LAB — GC/CHLAMYDIA PROBE AMP (~~LOC~~) NOT AT ARMC
Chlamydia: NEGATIVE
Neisseria Gonorrhea: NEGATIVE

## 2014-10-31 ENCOUNTER — Other Ambulatory Visit: Payer: Self-pay | Admitting: Obstetrics

## 2014-10-31 DIAGNOSIS — O289 Unspecified abnormal findings on antenatal screening of mother: Secondary | ICD-10-CM

## 2014-10-31 DIAGNOSIS — Z3A22 22 weeks gestation of pregnancy: Secondary | ICD-10-CM

## 2014-10-31 DIAGNOSIS — O10012 Pre-existing essential hypertension complicating pregnancy, second trimester: Secondary | ICD-10-CM

## 2014-10-31 DIAGNOSIS — O24012 Pre-existing diabetes mellitus, type 1, in pregnancy, second trimester: Secondary | ICD-10-CM

## 2014-11-02 ENCOUNTER — Ambulatory Visit (HOSPITAL_COMMUNITY)
Admission: RE | Admit: 2014-11-02 | Discharge: 2014-11-02 | Disposition: A | Discharge status: Home / Self Care | Payer: 59 | Source: Ambulatory Visit | Attending: Certified Nurse Midwife | Admitting: Certified Nurse Midwife

## 2014-11-02 DIAGNOSIS — Z3A22 22 weeks gestation of pregnancy: Secondary | ICD-10-CM | POA: Diagnosis not present

## 2014-11-02 DIAGNOSIS — O24012 Pre-existing diabetes mellitus, type 1, in pregnancy, second trimester: Secondary | ICD-10-CM

## 2014-11-02 DIAGNOSIS — O10012 Pre-existing essential hypertension complicating pregnancy, second trimester: Secondary | ICD-10-CM | POA: Insufficient documentation

## 2014-11-02 DIAGNOSIS — O289 Unspecified abnormal findings on antenatal screening of mother: Secondary | ICD-10-CM

## 2014-11-02 DIAGNOSIS — O283 Abnormal ultrasonic finding on antenatal screening of mother: Secondary | ICD-10-CM | POA: Diagnosis not present

## 2014-11-02 IMAGING — US US OB FOLLOW-UP
1 series · 12 of 26 positions shown · non-contrast
Comparison: none

[Series 1: us ob follow-up · 0.23mm/px · 12 of 26 slices shown]
[im 2/26]
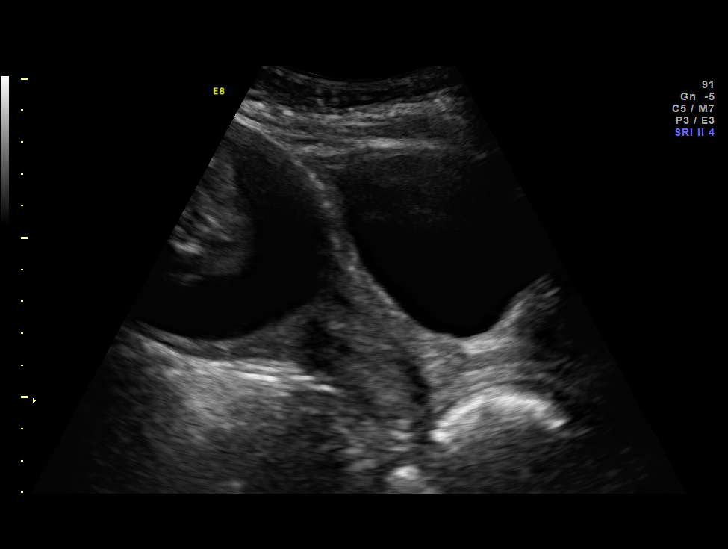
[im 4/26]
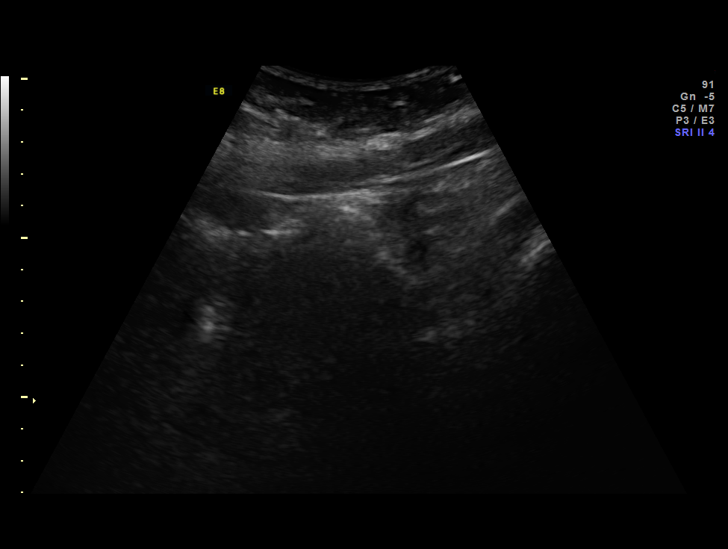
[im 6/26]
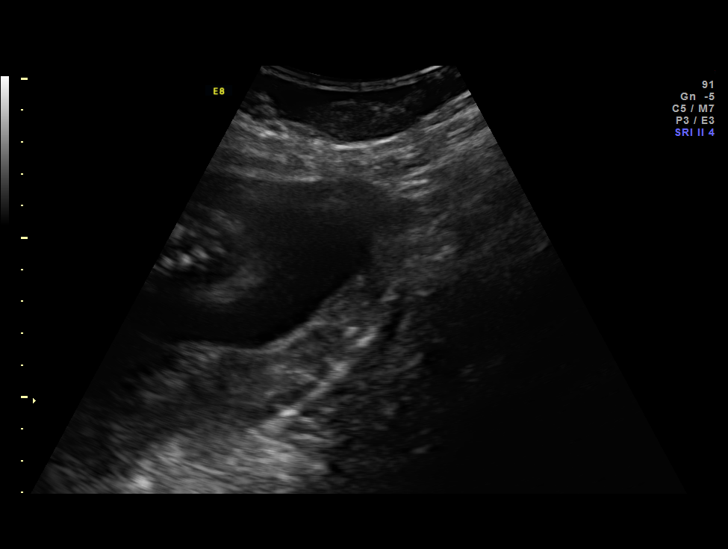
[im 8/26]
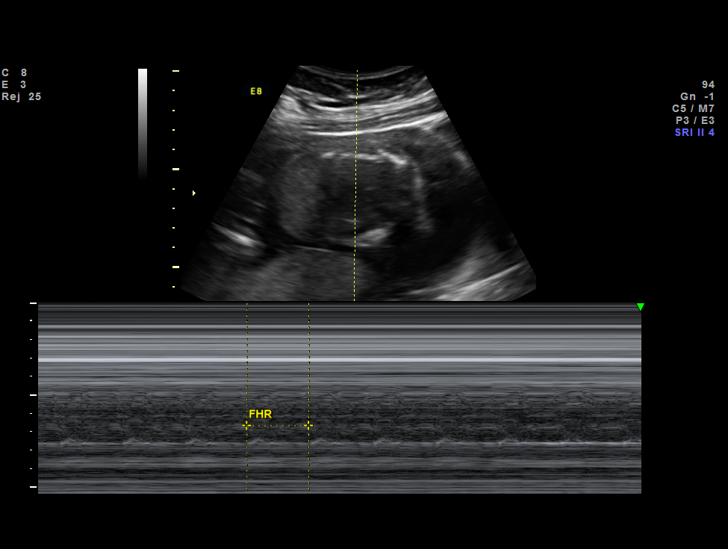
[im 10/26]
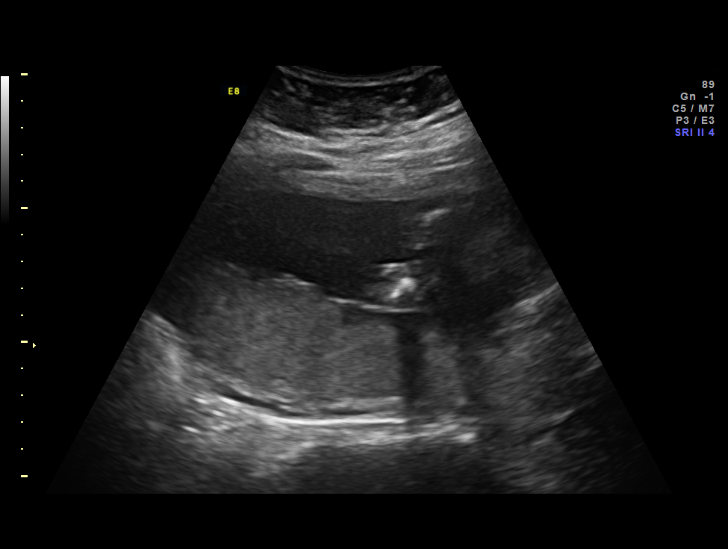
[im 12/26]
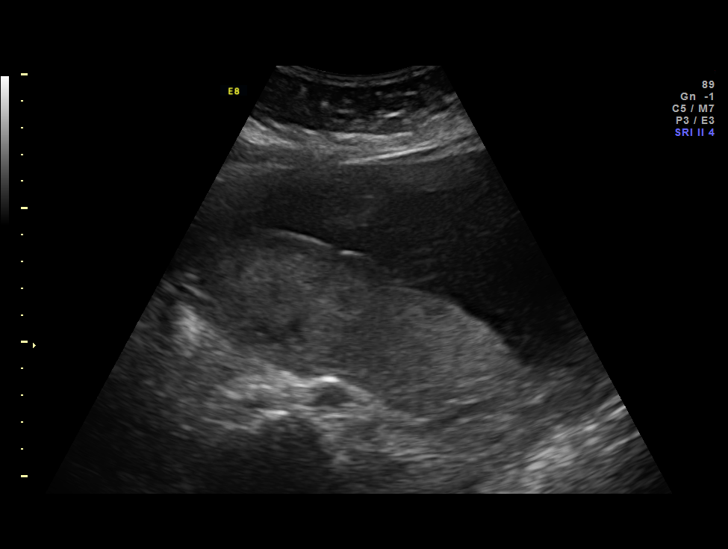
[im 15/26]
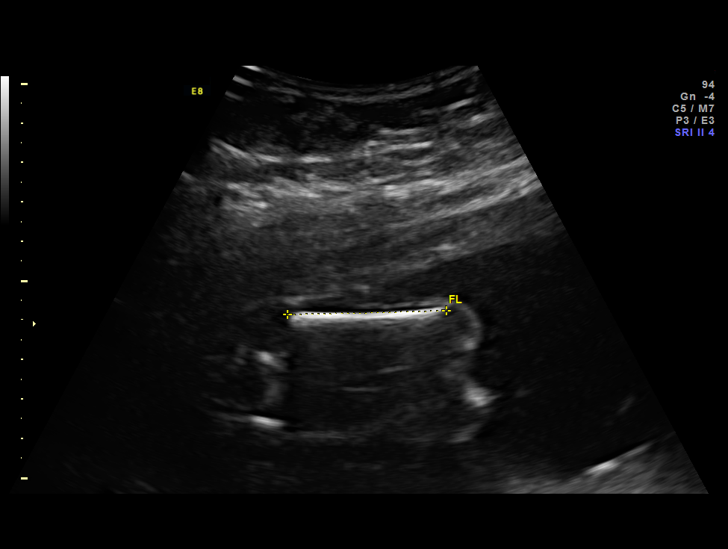
[im 17/26]
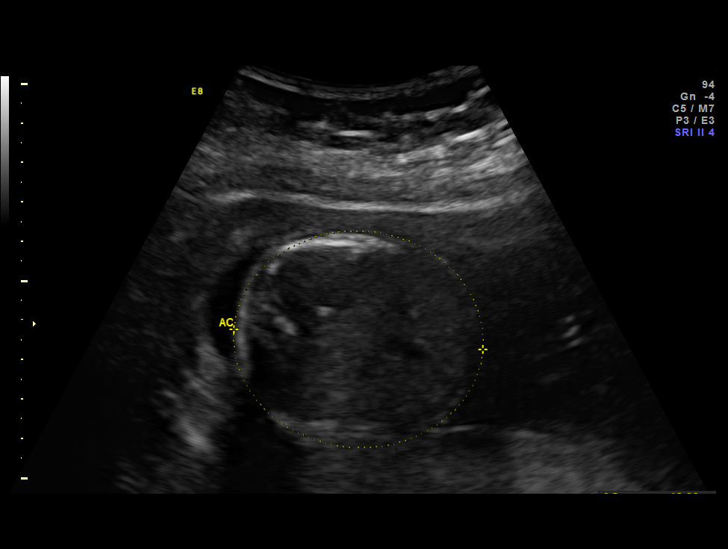
[im 19/26]
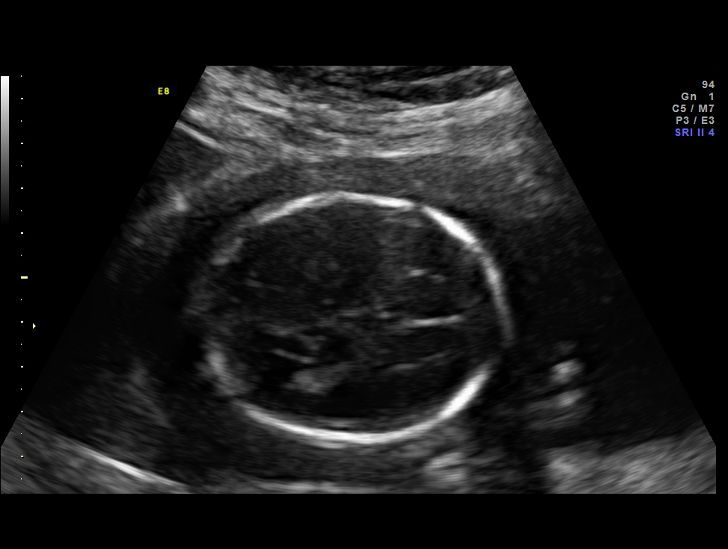
[im 21/26]
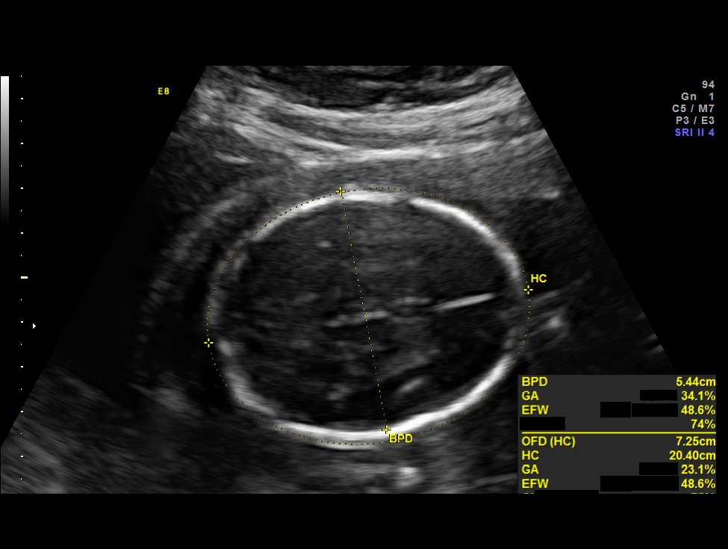
[im 23/26]
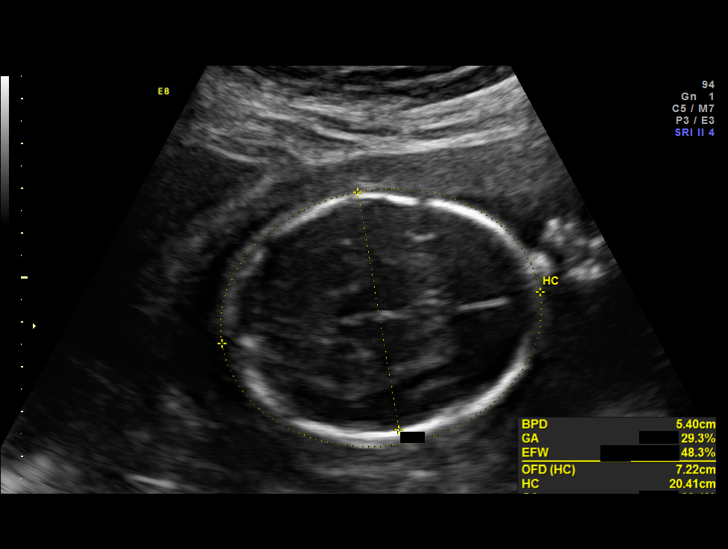
[im 25/26]
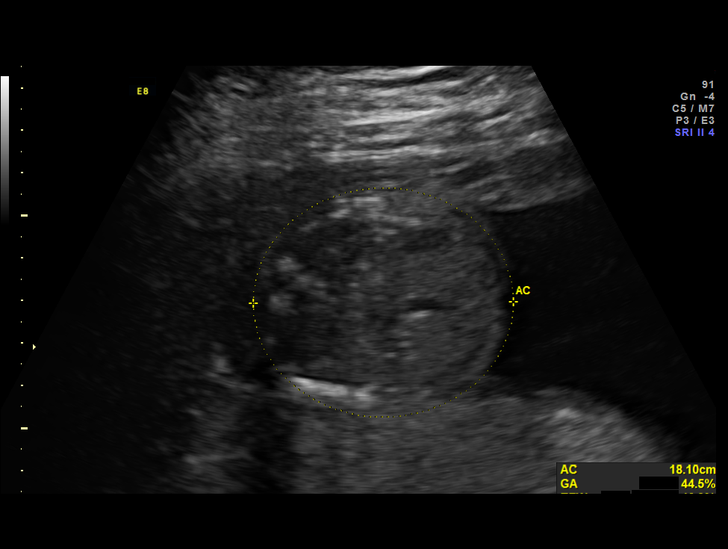

[12 of 26 positions shown; findings below may reference images not displayed]

OBSTETRICS REPORT
(Signed Final [DATE] [DATE])

Name:       MATANDA                        Visit  [DATE] [DATE]
Date:

Service(s) Provided

US OB FOLLOW UP                                        76816.1
Indications

Pre-existing essential hypertension complicating       [P9]
pregnancy, second trimester
Pre-existing diabetes, type 1?, in pregnancy,          [P9]
second trimester; on insulin; sees Dr. MATANDA,
endocrinologist
Abnormal first trimester screen (NT) DSR [DATE];        [P9]
MATANDA/MATANDA; declined testing
22 weeks gestation of pregnancy
Fetal Evaluation

Num Of             1
Fetuses:
Fetal Heart        163                          bpm
Rate:
Cardiac Activity:  Observed
Presentation:      Cephalic
Placenta:          Posterior, above cervical
os
P. Cord            Previously Visualized
Insertion:

Amniotic Fluid
AFI FV:      Subjectively within normal limits
Larg Pckt:      5.5  cm
Biometry

BPD:     54.2   m    G. Age:   22w 3d                 CI:         74.8   70 - 86
m
OFD:     72.5   m                                     FL/HC:      19.0   19.2 -
m
HC:     203.9   m    G. Age:   22w 3d        23  %    HC/AC:      1.13   1.05 -
m
AC:     180.9   m    G. Age:   22w 6d        45  %    FL/BPD      71.6   71 - 87
m                                     :
FL:      38.8   m    G. Age:   22w 3d        26  %    FL/AC:      21.4   20 - 24
m
Est.         526   gm    1 lb 3 oz      50   %
FW:
Gestational Age

LMP:           22w 6d        Date:  [DATE]                  EDD:   [DATE]
U/S Today:     22w 4d                                         EDD:   [DATE]
Best:          22w 6d    Det. By:   LMP  ([DATE])           EDD:   [DATE]
Anatomy

Cranium:          Appears normal         Aortic Arch:       Previously seen
Fetal Cavum:      Appears normal         Ductal Arch:       Previously seen
Ventricles:       Appears normal         Diaphragm:         Appears normal
Choroid Plexus:   Previously seen        Stomach:           Appears normal,
left sided
Cerebellum:       Appears normal         Abdomen:           Appears normal
Posterior         Previously seen        Abdominal          Previously seen
Fossa:                                   Wall:
Nuchal Fold:      Previously seen        Cord Vessels:      Previously seen
Face:             Orbits and profile     Kidneys:           Previously seen
previously seen
Lips:             Previously seen        Bladder:           Appears normal
Palate:           Previously seen        Spine:             Previously seen
Heart:            Previously seen        Lower              Previously seen
Extremities:
RVOT:             Previously seen        Upper              Previously seen
Extremities:
LVOT:             Previously seen

Other:   Heels and 5th digit previously visualized. Fetus appears to be a
female.
Cervix Uterus Adnexa

Cervical Length:    4.1       cm

Cervix:       Normal appearance by transabdominal scan.

Adnexa:     No abnormality visualized.
Impression

SIUP at 22+6 weeks
Normal interval anatomy; anatomic survey complete
Normal amniotic fluid volume
Appropriate interval growth with EFW at the 50th %tile

Fetal ECHO [DATE]
Recommendations

Follow-up ultrasound for growth in 4 weeks

## 2014-11-07 ENCOUNTER — Ambulatory Visit (INDEPENDENT_AMBULATORY_CARE_PROVIDER_SITE_OTHER): Payer: 59 | Admitting: Certified Nurse Midwife

## 2014-11-07 VITALS — BP 116/78 | HR 101 | Temp 98.2°F | Wt 191.6 lb

## 2014-11-07 DIAGNOSIS — O0992 Supervision of high risk pregnancy, unspecified, second trimester: Secondary | ICD-10-CM | POA: Diagnosis not present

## 2014-11-07 DIAGNOSIS — O09892 Supervision of other high risk pregnancies, second trimester: Secondary | ICD-10-CM

## 2014-11-07 LAB — POCT URINALYSIS DIPSTICK
Bilirubin, UA: NEGATIVE
Blood, UA: NEGATIVE
Glucose, UA: 250
Ketones, UA: NEGATIVE
Leukocytes, UA: NEGATIVE
Nitrite, UA: NEGATIVE
Spec Grav, UA: 1.02
Urobilinogen, UA: NEGATIVE
pH, UA: 6

## 2014-11-07 LAB — AST: AST: 10 U/L (ref 10–30)

## 2014-11-07 LAB — LACTATE DEHYDROGENASE: LDH: 131 U/L (ref 94–250)

## 2014-11-07 LAB — BUN: BUN: 11 mg/dL (ref 7–25)

## 2014-11-07 LAB — CBC
HCT: 31.1 % — ABNORMAL LOW (ref 36.0–46.0)
Hemoglobin: 10.2 g/dL — ABNORMAL LOW (ref 12.0–15.0)
MCH: 30.7 pg (ref 26.0–34.0)
MCHC: 32.8 g/dL (ref 30.0–36.0)
MCV: 93.7 fL (ref 78.0–100.0)
MPV: 9 fL (ref 8.6–12.4)
Platelets: 228 10*3/uL (ref 150–400)
RBC: 3.32 MIL/uL — ABNORMAL LOW (ref 3.87–5.11)
RDW: 11.8 % (ref 11.5–15.5)
WBC: 6.4 10*3/uL (ref 4.0–10.5)

## 2014-11-07 LAB — HEMOGLOBIN A1C
Hgb A1c MFr Bld: 7.3 % — ABNORMAL HIGH (ref <5.7)
Mean Plasma Glucose: 163 mg/dL — ABNORMAL HIGH (ref <117)

## 2014-11-07 LAB — TSH: TSH: 1.239 u[IU]/mL (ref 0.350–4.500)

## 2014-11-07 LAB — CREATININE, SERUM: Creat: 0.48 mg/dL — ABNORMAL LOW (ref 0.50–1.10)

## 2014-11-07 LAB — ALT: ALT: 11 U/L (ref 6–29)

## 2014-11-07 NOTE — Progress Notes (Signed)
Subjective:    [name] is a 24 y.o. female being seen today for her obstetrical visit. She is at [redacted]w[redacted]d gestation. Patient reports: no complaints . Fetal movement: normal.  Insulin pump not functioning at the moment.  BS range from 89-145 for fasting, highest reported was 245.  Saw Dr. Sharl Ma two weeks ago, has another appointment in one week.  Not currently employed.  Currently moving in September to a new home.  Spouse is working two jobs.  States they are doing well.    Problem List Items Addressed This Visit    None    Visit Diagnoses    Supervision of high risk pregnancy in second trimester    -  Primary    Relevant Orders    POCT urinalysis dipstick (Completed)    Supervision of other high risk pregnancy, antepartum, second trimester        Relevant Orders    Hemoglobin A1c    AMB referral to maternal fetal medicine    TSH    Creatinine, serum    BUN    CBC    ALT    AST    Protein / creatinine ratio, urine    Lactate dehydrogenase      Patient Active Problem List   Diagnosis Date Noted  . Abnormal maternal serum screening test 10/05/2014  . High risk pregnancy with low PAPPA 10/05/2014  . Pre-existing essential hypertension complicating pregnancy in second trimester   . Abnormal first trimester screen   . Encounter for fetal anatomic survey   . [redacted] weeks gestation of pregnancy   . Encounter for (NT) nuchal translucency scan   . First trimester screening   . [redacted] weeks gestation of pregnancy   . Pre-existing type 1 diabetes mellitus during pregnancy in first trimester   . Hypertension in pregnancy   . DKA (diabetic ketoacidoses) 06/11/2013  . DKA, type 1 06/11/2013  . Orthostatic hypotension dysautonomic syndrome 04/28/2013  . Body aches 04/28/2013  . Orthostatic hypotension 04/28/2013  . Diabetes mellitus type 1 04/28/2013  . Protein-calorie malnutrition, severe 04/28/2013  . Noncompliance with diabetes treatment 09/01/2012  . Epigastric abdominal pain 03/15/2012   . Dyspnea 02/28/2012  . Sinus tachycardia 02/28/2012  . Hyperglycemia without ketosis 02/28/2012  . Chest pain 12/04/2011  . Diabetes type 1, uncontrolled 11/14/2011  . Hypertension associated with diabetes 11/14/2011  . Gastroparesis 11/14/2011  . Hyponatremia 09/19/2011  . Abdominal pain 09/18/2011  . Metabolic alkalosis 09/18/2011  . Diabetic hyperosmolar non-ketotic state 09/18/2011  . DIAB W/UNSPEC COMP TYPE I [JUV TYPE] UNCNTRL 06/04/2010  . CONSTIPATION 01/01/2010  . RECTAL PAIN 01/01/2010   Objective:    BP 116/78 mmHg  Pulse 101  Temp(Src) 98.2 F (36.8 C)  Wt 191 lb 9.6 oz (86.909 kg)  LMP 05/26/2014 FHT: 145 BPM  Uterine Size: size equals dates     Assessment:    Pregnancy @ [redacted]w[redacted]d    IDDM Doing well.   Plan:    Signs and symptoms of preterm labor: discussed. Updated labs today.   Labs, problem list reviewed and updated Follow up in 3 weeks with Dr. Clearance Coots.

## 2014-11-08 LAB — PROTEIN / CREATININE RATIO, URINE
Creatinine, Urine: 190.6 mg/dL
Protein Creatinine Ratio: 0.12 (ref <0.15)
Total Protein, Urine: 23 mg/dL (ref 5–24)

## 2014-11-09 ENCOUNTER — Encounter (HOSPITAL_COMMUNITY): Payer: Self-pay | Admitting: Anesthesiology

## 2014-11-09 ENCOUNTER — Telehealth: Payer: Self-pay | Admitting: *Deleted

## 2014-11-09 ENCOUNTER — Inpatient Hospital Stay (HOSPITAL_COMMUNITY)
Admission: AD | Admit: 2014-11-09 | Discharge: 2014-11-09 | Disposition: A | Discharge status: Home / Self Care | Payer: 59 | Source: Ambulatory Visit | Attending: Obstetrics | Admitting: Obstetrics

## 2014-11-09 ENCOUNTER — Encounter (HOSPITAL_COMMUNITY): Payer: Self-pay | Admitting: *Deleted

## 2014-11-09 DIAGNOSIS — E1065 Type 1 diabetes mellitus with hyperglycemia: Secondary | ICD-10-CM | POA: Insufficient documentation

## 2014-11-09 DIAGNOSIS — G43909 Migraine, unspecified, not intractable, without status migrainosus: Secondary | ICD-10-CM | POA: Diagnosis not present

## 2014-11-09 DIAGNOSIS — O24012 Pre-existing diabetes mellitus, type 1, in pregnancy, second trimester: Secondary | ICD-10-CM | POA: Diagnosis not present

## 2014-11-09 DIAGNOSIS — Z3A23 23 weeks gestation of pregnancy: Secondary | ICD-10-CM | POA: Diagnosis not present

## 2014-11-09 DIAGNOSIS — O26892 Other specified pregnancy related conditions, second trimester: Secondary | ICD-10-CM

## 2014-11-09 DIAGNOSIS — E860 Dehydration: Secondary | ICD-10-CM | POA: Diagnosis not present

## 2014-11-09 DIAGNOSIS — R51 Headache: Secondary | ICD-10-CM | POA: Diagnosis not present

## 2014-11-09 DIAGNOSIS — Z794 Long term (current) use of insulin: Secondary | ICD-10-CM | POA: Diagnosis not present

## 2014-11-09 DIAGNOSIS — O212 Late vomiting of pregnancy: Secondary | ICD-10-CM | POA: Diagnosis not present

## 2014-11-09 DIAGNOSIS — O219 Vomiting of pregnancy, unspecified: Secondary | ICD-10-CM

## 2014-11-09 DIAGNOSIS — R519 Headache, unspecified: Secondary | ICD-10-CM

## 2014-11-09 LAB — CBC WITH DIFFERENTIAL/PLATELET
Basophils Absolute: 0 10*3/uL (ref 0.0–0.1)
Basophils Relative: 0 % (ref 0–1)
Eosinophils Absolute: 0 10*3/uL (ref 0.0–0.7)
Eosinophils Relative: 1 % (ref 0–5)
HCT: 31.2 % — ABNORMAL LOW (ref 36.0–46.0)
Hemoglobin: 10.7 g/dL — ABNORMAL LOW (ref 12.0–15.0)
Lymphocytes Relative: 17 % (ref 12–46)
Lymphs Abs: 1.1 10*3/uL (ref 0.7–4.0)
MCH: 31 pg (ref 26.0–34.0)
MCHC: 34.3 g/dL (ref 30.0–36.0)
MCV: 90.4 fL (ref 78.0–100.0)
Monocytes Absolute: 0.2 10*3/uL (ref 0.1–1.0)
Monocytes Relative: 3 % (ref 3–12)
Neutro Abs: 5.4 10*3/uL (ref 1.7–7.7)
Neutrophils Relative %: 79 % — ABNORMAL HIGH (ref 43–77)
Platelets: 214 10*3/uL (ref 150–400)
RBC: 3.45 MIL/uL — ABNORMAL LOW (ref 3.87–5.11)
RDW: 11.9 % (ref 11.5–15.5)
WBC: 6.7 10*3/uL (ref 4.0–10.5)

## 2014-11-09 LAB — COMPREHENSIVE METABOLIC PANEL
ALT: 16 U/L (ref 14–54)
AST: 17 U/L (ref 15–41)
Albumin: 2.9 g/dL — ABNORMAL LOW (ref 3.5–5.0)
Alkaline Phosphatase: 56 U/L (ref 38–126)
Anion gap: 10 (ref 5–15)
BUN: 11 mg/dL (ref 6–20)
CO2: 24 mmol/L (ref 22–32)
Calcium: 9.1 mg/dL (ref 8.9–10.3)
Chloride: 101 mmol/L (ref 101–111)
Creatinine, Ser: 0.46 mg/dL (ref 0.44–1.00)
GFR calc Af Amer: >60 mL/min (ref >60)
GFR calc non Af Amer: >60 mL/min (ref >60)
Glucose, Bld: 200 mg/dL — ABNORMAL HIGH (ref 65–99)
Potassium: 4.1 mmol/L (ref 3.5–5.1)
Sodium: 135 mmol/L (ref 135–145)
Total Bilirubin: 0.4 mg/dL (ref 0.3–1.2)
Total Protein: 7.1 g/dL (ref 6.5–8.1)

## 2014-11-09 LAB — URINALYSIS, ROUTINE W REFLEX MICROSCOPIC
Bilirubin Urine: NEGATIVE
Glucose, UA: >1000 mg/dL — AB
Hgb urine dipstick: NEGATIVE
Ketones, ur: 15 mg/dL — AB
Leukocytes, UA: NEGATIVE
Nitrite: NEGATIVE
Protein, ur: NEGATIVE mg/dL
Specific Gravity, Urine: 1.015 (ref 1.005–1.030)
Urobilinogen, UA: 0.2 mg/dL (ref 0.0–1.0)
pH: 6 (ref 5.0–8.0)

## 2014-11-09 LAB — URINE MICROSCOPIC-ADD ON

## 2014-11-09 MED ORDER — DEXTROSE IN LACTATED RINGERS 5 % IV SOLN: INTRAVENOUS | Status: DC

## 2014-11-09 MED ORDER — PROMETHAZINE HCL 25 MG/ML IJ SOLN
12.5000 mg | Freq: Once | INTRAMUSCULAR | Status: AC
  Administered 2014-11-09: 12.5 mg via INTRAVENOUS
  Filled 2014-11-09: qty 1

## 2014-11-09 MED ORDER — BUTALBITAL-APAP-CAFFEINE 50-325-40 MG PO TABS: 1.0000 | ORAL_TABLET | Freq: Four times a day (QID) | ORAL | Status: AC | PRN

## 2014-11-09 MED ORDER — METOCLOPRAMIDE HCL 5 MG/ML IJ SOLN
10.0000 mg | Freq: Once | INTRAMUSCULAR | Status: AC
  Administered 2014-11-09: 10 mg via INTRAVENOUS
  Filled 2014-11-09: qty 2

## 2014-11-09 MED ORDER — LACTATED RINGERS IV SOLN
INTRAVENOUS | Status: DC
  Administered 2014-11-09: 21:00:00 via INTRAVENOUS

## 2014-11-09 MED ORDER — PROMETHAZINE HCL 25 MG PO TABS: 12.5000 mg | ORAL_TABLET | Freq: Four times a day (QID) | ORAL | Status: DC | PRN

## 2014-11-09 MED ORDER — DIPHENHYDRAMINE HCL 50 MG/ML IJ SOLN: 25.0000 mg | Freq: Once | INTRAMUSCULAR | Status: DC

## 2014-11-09 NOTE — MAU Provider Note (Signed)
History     CSN: [id number]  Arrival date and time: 11/09/14 1512   First Provider Initiated Contact with Patient 11/09/14 1937      Chief Complaint  Patient presents with  . Headache  . Nausea  . Emesis   THis is a 24 y.o. at [redacted]w[redacted]d who presents with c/o headache since this morning. States took Tylenol without relief. Also has some nausea and vomiting with it. Does have a history of migraines that present like this one.   History is remarkable for Type I diabetes.  Uses insulin pump.  Has not had good control of her blood sugars    Headache  This is a new problem. The current episode started today. The problem has been unchanged. The pain is located in the bilateral region. The pain does not radiate. The pain quality is similar to prior headaches. The quality of the pain is described as aching, dull and squeezing. The pain is moderate. Associated symptoms include nausea, photophobia and vomiting. Pertinent negatives include no abdominal pain, anorexia, blurred vision, dizziness, fever, muscle aches, sinus pressure, visual change or weakness. The symptoms are aggravated by bright light. She has tried acetaminophen for the symptoms. The treatment provided no relief. Her past medical history is significant for migraine headaches.   RN Note:  Expand All Collapse All   Pt Presents to MAU with complaints of headache, nausea, and vomiting since this morning. Denies any vaginal bleeding or discharge          OB History    Gravida Para Term Preterm AB TAB SAB Ectopic Multiple Living   1               Past Medical History  Diagnosis Date  . Diabetes mellitus   . Hypertension   . Diabetes type 1, uncontrolled 11/14/2011    Since age 68  . Gastroparesis   . Anxiety   . Infection     UTI April 2016  . Fibromyalgia     Past Surgical History  Procedure Laterality Date  . Ankle surgery    . Cholecystectomy  11/15/2011    Procedure: LAPAROSCOPIC CHOLECYSTECTOMY WITH  INTRAOPERATIVE CHOLANGIOGRAM;  Surgeon: [name], MD;  Location: [facility];  Service: General;  Laterality: N/A;  . Laparoscopy  11/23/2011    Procedure: LAPAROSCOPY DIAGNOSTIC;  Surgeon: Mariella Saa, MD;  Location: [facility];  Service: General;  Laterality: N/A;  . Esophagogastroduodenoscopy  12/03/2011    Procedure: ESOPHAGOGASTRODUODENOSCOPY (EGD);  Surgeon: Theda Belfast, MD;  Location: [facility];  Service: Endoscopy;  Laterality: N/A;    Family History  Problem Relation Age of Onset  . Diabetes Mother   . Hypertension Father     Social History  Substance Use Topics  . Smoking status: Never Smoker   . Smokeless tobacco: Never Used  . Alcohol Use: No     Comment: Occasional, once a month mixed drink- when not pregnant    Allergies:  Allergies  Allergen Reactions  . Peanut-Containing Drug Products Anaphylaxis and Other (See Comments)    Pt states that she is allergic to [place] nuts.        Prescriptions prior to admission  Medication Sig Dispense Refill Last Dose  . acetaminophen (TYLENOL) 500 MG tablet Take 1,000 mg by mouth every 6 (six) hours as needed for mild pain or headache.    11/09/2014 at Unknown time  . aspirin 81 MG chewable tablet Chew 1 tablet (81 mg total) by mouth daily. 30  tablet 12 11/08/2014 at Unknown time  . Cholecalciferol (VITAMIN D PO) Take 2 each by mouth daily.    11/08/2014 at Unknown time  . doxylamine, Sleep, (UNISOM) 25 MG tablet Take 25-50 mg by mouth at bedtime as needed for sleep.   11/08/2014 at Unknown time  . Doxylamine-Pyridoxine 10-10 MG TBEC Start with 2 tablets every evening. If symptoms persist, add 1 tablet every morning. If symptoms persist, add 1 tablet at mid-day. (Patient taking differently: Take 2 tablets by mouth at bedtime. ) 60 tablet 3 11/09/2014 at Unknown time  . insulin detemir (LEVEMIR) 100 UNIT/ML injection Inject 14 Units into the skin 2 (two) times daily.   11/09/2014 at Unknown time  . insulin lispro (HUMALOG) 100  UNIT/ML injection Inject 2-12 Units into the skin 3 (three) times daily as needed for high blood sugar. Pt uses as needed per sliding scale.   11/09/2014 at Unknown time  . Prenat w/o A-FeCbn-Meth-FA-DHA (OB COMPLETE GOLD) 27.5-1-200 MG CAPS Take 1 tablet by mouth daily. (Patient taking differently: Take 1 capsule by mouth daily. ) 30 capsule 12 11/08/2014 at Unknown time  . promethazine (PHENERGAN) 25 MG tablet Take 1 tablet (25 mg total) by mouth every 6 (six) hours as needed. (Patient taking differently: Take 25 mg by mouth every 6 (six) hours as needed for nausea or vomiting. ) 30 tablet 2 Past Month at Unknown time  . EPINEPHrine (EPIPEN 2-PAK) 0.3 mg/0.3 mL IJ SOAJ injection Inject 0.3 mLs (0.3 mg total) into the muscle once. (Patient taking differently: Inject 0.3 mg into the muscle once as needed (for severe allergic reaction). ) 1 Device 1 prn  . glucose 4 GM chewable tablet Chew 1 tablet (4 g total) by mouth as needed for low blood sugar. 50 tablet 12 prn  . ketotifen (ZADITOR) 0.025 % ophthalmic solution Place 1 drop into both eyes 2 (two) times daily as needed (dry eyes).   prn  . terconazole (TERAZOL 7) 0.4 % vaginal cream Place 1 applicator vaginally at bedtime. (Patient not taking: Reported on 11/09/2014) 45 g 0 Completed Course at Unknown time   Medical, Surgical, Family and Social histories reviewed and are listed above.  Medications and allergies reviewed.   Review of Systems  Constitutional: Negative for fever, chills and malaise/fatigue.  HENT: Negative for sinus pressure.   Eyes: Positive for photophobia. Negative for blurred vision and double vision.  Gastrointestinal: Positive for nausea and vomiting. Negative for abdominal pain, diarrhea, constipation and anorexia.  Neurological: Positive for headaches. Negative for dizziness, sensory change, speech change, focal weakness and weakness.  Other systems negative  Physical Exam   Blood pressure 110/61, pulse 105, temperature  97.6 F (36.4 C), temperature source Oral, resp. rate 18, weight 85.73 kg (189 lb), last menstrual period 05/26/2014.  Physical Exam  Constitutional: She is oriented to person, place, and time. She appears well-developed and well-nourished. No distress.  HENT:  Head: Normocephalic.  Eyes: EOM are normal.  Neck: Normal range of motion. Neck supple.  Cardiovascular: Normal rate, regular rhythm and normal heart sounds.  Exam reveals no gallop and no friction rub.   No murmur heard. Respiratory: Effort normal and breath sounds normal. No respiratory distress. She has no wheezes. She has no rales. She exhibits no tenderness.  GI: Soft. She exhibits no distension and no mass. There is no tenderness. There is no rebound and no guarding.  Musculoskeletal: Normal range of motion. She exhibits no edema or tenderness.  Neurological: She is alert and  oriented to person, place, and time. No cranial nerve deficit. She exhibits normal muscle tone. Coordination normal.  Skin: Skin is warm and dry.  Psychiatric: She has a normal mood and affect.    MAU Course  Procedures  MDM Results for orders placed or performed during the hospital encounter of 11/09/14 (from the past 24 hour(s))  Urinalysis, Routine w reflex microscopic (not at [facility])     Status: Abnormal   Collection Time: 11/09/14  3:40 PM  Result Value Ref Range   Color, Urine YELLOW YELLOW   APPearance CLEAR CLEAR   Specific Gravity, Urine 1.015 1.005 - 1.030   pH 6.0 5.0 - 8.0   Glucose, UA >1000 (A) NEGATIVE mg/dL   Hgb urine dipstick NEGATIVE NEGATIVE   Bilirubin Urine NEGATIVE NEGATIVE   Ketones, ur 15 (A) NEGATIVE mg/dL   Protein, ur NEGATIVE NEGATIVE mg/dL   Urobilinogen, UA 0.2 0.0 - 1.0 mg/dL   Nitrite NEGATIVE NEGATIVE   Leukocytes, UA NEGATIVE NEGATIVE  Urine microscopic-add on     Status: None   Collection Time: 11/09/14  3:40 PM  Result Value Ref Range   Squamous Epithelial / LPF RARE RARE  Comprehensive metabolic panel      Status: Abnormal   Collection Time: 11/09/14  4:48 PM  Result Value Ref Range   Sodium 135 135 - 145 mmol/L   Potassium 4.1 3.5 - 5.1 mmol/L   Chloride 101 101 - 111 mmol/L   CO2 24 22 - 32 mmol/L   Glucose, Bld 200 (H) 65 - 99 mg/dL   BUN 11 6 - 20 mg/dL   Creatinine, Ser 6.96 0.44 - 1.00 mg/dL   Calcium 9.1 8.9 - 29.5 mg/dL   Total Protein 7.1 6.5 - 8.1 g/dL   Albumin 2.9 (L) 3.5 - 5.0 g/dL   AST 17 15 - 41 U/L   ALT 16 14 - 54 U/L   Alkaline Phosphatase 56 38 - 126 U/L   Total Bilirubin 0.4 0.3 - 1.2 mg/dL   GFR calc non Af Amer >60 >60 mL/min   GFR calc Af Amer >60 >60 mL/min   Anion gap 10 5 - 15  CBC with Differential     Status: Abnormal   Collection Time: 11/09/14  4:48 PM  Result Value Ref Range   WBC 6.7 4.0 - 10.5 K/uL   RBC 3.45 (L) 3.87 - 5.11 MIL/uL   Hemoglobin 10.7 (L) 12.0 - 15.0 g/dL   HCT 28.4 (L) 13.2 - 44.0 %   MCV 90.4 78.0 - 100.0 fL   MCH 31.0 26.0 - 34.0 pg   MCHC 34.3 30.0 - 36.0 g/dL   RDW 10.2 72.5 - 36.6 %   Platelets 214 150 - 400 K/uL   Neutrophils Relative % 79 (H) 43 - 77 %   Neutro Abs 5.4 1.7 - 7.7 K/uL   Lymphocytes Relative 17 12 - 46 %   Lymphs Abs 1.1 0.7 - 4.0 K/uL   Monocytes Relative 3 3 - 12 %   Monocytes Absolute 0.2 0.1 - 1.0 K/uL   Eosinophils Relative 1 0 - 5 %   Eosinophils Absolute 0.0 0.0 - 0.7 K/uL   Basophils Relative 0 0 - 1 %   Basophils Absolute 0.0 0.0 - 0.1 K/uL   WIll start IV with LR for hydration.   Labs reviewed, no evidence of DKA, though there is hyperglycemia Will give Reglan and phenergan for migraine 2218: Patient reports that her headache and nausea have resolved at  this time.   Assessment and Plan  A:  SIUP at [redacted]w[redacted]d       Migraine headache      Mild dehydration      Hyper glycemia  1. Headache in pregnancy, second trimester   2. Nausea/vomiting in pregnancy   3. Dehydration, mild      P:  Report given to oncoming provider  DC home Comfort measures reviewed  2nd Trimester precautions   PTL precautions  Fetal kick counts RX: fioricet PRN, phenergan PRN  Return to MAU as needed FU with OB as planned  Follow-up Information    Follow up with Brock Bad, MD.   Specialty:  Obstetrics and Gynecology   Why:  As scheduled   Contact information:   [address] [place] [address] [phone]         Tawnya Crook 11/09/2014, 10:18 PM

## 2014-11-09 NOTE — MAU Note (Signed)
Pt  Presents to MAU with complaints of headache, nausea, and vomiting since this morning. Denies any vaginal bleeding or discharge

## 2014-11-09 NOTE — Telephone Encounter (Signed)
Patient states she woke up today with a bad headache and has thrown up 3-4 times. Patient advised to be seen at MAU. Patient states she is currently there.

## 2014-11-09 NOTE — Discharge Instructions (Signed)
Second Trimester of Pregnancy The second trimester is from week 13 through week 28, months 4 through 6. The second trimester is often a time when you feel your best. Your body has also adjusted to being pregnant, and you begin to feel better physically. Usually, morning sickness has lessened or quit completely, you may have more energy, and you may have an increase in appetite. The second trimester is also a time when the fetus is growing rapidly. At the end of the sixth month, the fetus is about 9 inches long and weighs about 1 pounds. You will likely begin to feel the baby move (quickening) between 18 and 20 weeks of the pregnancy. BODY CHANGES Your body goes through many changes during pregnancy. The changes vary from woman to woman.   Your weight will continue to increase. You will notice your lower abdomen bulging out.  You may begin to get stretch marks on your hips, abdomen, and breasts.  You may develop headaches that can be relieved by medicines approved by your health care provider.  You may urinate more often because the fetus is pressing on your bladder.  You may develop or continue to have heartburn as a result of your pregnancy.  You may develop constipation because certain hormones are causing the muscles that push waste through your intestines to slow down.  You may develop hemorrhoids or swollen, bulging veins (varicose veins).  You may have back pain because of the weight gain and pregnancy hormones relaxing your joints between the bones in your pelvis and as a result of a shift in weight and the muscles that support your balance.  Your breasts will continue to grow and be tender.  Your gums may bleed and may be sensitive to brushing and flossing.  Dark spots or blotches (chloasma, mask of pregnancy) may develop on your face. This will likely fade after the baby is born.  A dark line from your belly button to the pubic area (linea nigra) may appear. This will likely fade  after the baby is born.  You may have changes in your hair. These can include thickening of your hair, rapid growth, and changes in texture. Some women also have hair loss during or after pregnancy, or hair that feels dry or thin. Your hair will most likely return to normal after your baby is born. WHAT TO EXPECT AT YOUR PRENATAL VISITS During a routine prenatal visit:  You will be weighed to make sure you and the fetus are growing normally.  Your blood pressure will be taken.  Your abdomen will be measured to track your baby's growth.  The fetal heartbeat will be listened to.  Any test results from the previous visit will be discussed. Your health care provider may ask you:  How you are feeling.  If you are feeling the baby move.  If you have had any abnormal symptoms, such as leaking fluid, bleeding, severe headaches, or abdominal cramping.  If you have any questions. Other tests that may be performed during your second trimester include:  Blood tests that check for:  Low iron levels (anemia).  Gestational diabetes (between 24 and 28 weeks).  Rh antibodies.  Urine tests to check for infections, diabetes, or protein in the urine.  An ultrasound to confirm the proper growth and development of the baby.  An amniocentesis to check for possible genetic problems.  Fetal screens for spina bifida and Down syndrome. HOME CARE INSTRUCTIONS   Avoid all smoking, herbs, alcohol, and unprescribed   drugs. These chemicals affect the formation and growth of the baby.  Follow your health care provider's instructions regarding medicine use. There are medicines that are either safe or unsafe to take during pregnancy.  Exercise only as directed by your health care provider. Experiencing uterine cramps is a good sign to stop exercising.  Continue to eat regular, healthy meals.  Wear a good support bra for breast tenderness.  Do not use hot tubs, steam rooms, or saunas.  Wear your  seat belt at all times when driving.  Avoid raw meat, uncooked cheese, cat litter boxes, and soil used by cats. These carry germs that can cause birth defects in the baby.  Take your prenatal vitamins.  Try taking a stool softener (if your health care provider approves) if you develop constipation. Eat more high-fiber foods, such as fresh vegetables or fruit and whole grains. Drink plenty of fluids to keep your urine clear or pale yellow.  Take warm sitz baths to soothe any pain or discomfort caused by hemorrhoids. Use hemorrhoid cream if your health care provider approves.  If you develop varicose veins, wear support hose. Elevate your feet for 15 minutes, 3-4 times a day. Limit salt in your diet.  Avoid heavy lifting, wear low heel shoes, and practice good posture.  Rest with your legs elevated if you have leg cramps or low back pain.  Visit your dentist if you have not gone yet during your pregnancy. Use a soft toothbrush to brush your teeth and be gentle when you floss.  A sexual relationship may be continued unless your health care provider directs you otherwise.  Continue to go to all your prenatal visits as directed by your health care provider. SEEK MEDICAL CARE IF:   You have dizziness.  You have mild pelvic cramps, pelvic pressure, or nagging pain in the abdominal area.  You have persistent nausea, vomiting, or diarrhea.  You have a bad smelling vaginal discharge.  You have pain with urination. SEEK IMMEDIATE MEDICAL CARE IF:   You have a fever.  You are leaking fluid from your vagina.  You have spotting or bleeding from your vagina.  You have severe abdominal cramping or pain.  You have rapid weight gain or loss.  You have shortness of breath with chest pain.  You notice sudden or extreme swelling of your face, hands, ankles, feet, or legs.  You have not felt your baby move in over an hour.  You have severe headaches that do not go away with  medicine.  You have vision changes. Document Released: 03/11/2001 Document Revised: 03/22/2013 Document Reviewed: 05/18/2012 Exit[employer] Patient Information 2015 [employer]. This information is not intended to replace advice given to you by your health care provider. Make sure you discuss any questions you have with your health care provider.  

## 2014-11-21 ENCOUNTER — Other Ambulatory Visit (HOSPITAL_COMMUNITY): Payer: Self-pay | Admitting: Maternal and Fetal Medicine

## 2014-11-21 DIAGNOSIS — Z3A26 26 weeks gestation of pregnancy: Secondary | ICD-10-CM

## 2014-11-21 DIAGNOSIS — O10012 Pre-existing essential hypertension complicating pregnancy, second trimester: Secondary | ICD-10-CM

## 2014-11-21 DIAGNOSIS — O24012 Pre-existing diabetes mellitus, type 1, in pregnancy, second trimester: Secondary | ICD-10-CM

## 2014-11-21 DIAGNOSIS — O289 Unspecified abnormal findings on antenatal screening of mother: Secondary | ICD-10-CM

## 2014-11-28 ENCOUNTER — Ambulatory Visit (INDEPENDENT_AMBULATORY_CARE_PROVIDER_SITE_OTHER): Payer: 59 | Admitting: Obstetrics

## 2014-11-28 VITALS — BP 125/82 | HR 106 | Temp 97.8°F | Wt 196.2 lb

## 2014-11-28 DIAGNOSIS — Z3483 Encounter for supervision of other normal pregnancy, third trimester: Secondary | ICD-10-CM

## 2014-11-28 LAB — POCT URINALYSIS DIPSTICK
Bilirubin, UA: NEGATIVE
Blood, UA: NEGATIVE
Glucose, UA: 100
Ketones, UA: NEGATIVE
Leukocytes, UA: NEGATIVE
Nitrite, UA: NEGATIVE
Protein, UA: NEGATIVE
Spec Grav, UA: 1.01
Urobilinogen, UA: NEGATIVE
pH, UA: 6.5

## 2014-11-29 ENCOUNTER — Encounter: Payer: Self-pay | Admitting: Obstetrics

## 2014-11-29 NOTE — Progress Notes (Signed)
Subjective:    [name] is a 24 y.o. female being seen today for her obstetrical visit. She is at 22w5dgestation. Patient reports: no complaints . Fetal movement: normal.  Problem List Items Addressed This Visit    None    Visit Diagnoses    Encounter for supervision of other normal pregnancy in second trimester    -  Primary    Relevant Orders    POCT urinalysis dipstick (Completed)      Patient Active Problem List   Diagnosis Date Noted  . Abnormal maternal serum screening test 10/05/2014  . High risk pregnancy with low PAPPA 10/05/2014  . Pre-existing essential hypertension complicating pregnancy in second trimester   . Abnormal first trimester screen   . Encounter for fetal anatomic survey   . [redacted] weeks gestation of pregnancy   . Encounter for (NT) nuchal translucency scan   . First trimester screening   . [redacted] weeks gestation of pregnancy   . Pre-existing type 1 diabetes mellitus during pregnancy in first trimester   . Hypertension in pregnancy   . DKA (diabetic ketoacidoses) 06/11/2013  . DKA, type 1 06/11/2013  . Orthostatic hypotension dysautonomic syndrome 04/28/2013  . Body aches 04/28/2013  . Orthostatic hypotension 04/28/2013  . Diabetes mellitus type 1 04/28/2013  . Protein-calorie malnutrition, severe 04/28/2013  . Noncompliance with diabetes treatment 09/01/2012  . Epigastric abdominal pain 03/15/2012  . Dyspnea 02/28/2012  . Sinus tachycardia 02/28/2012  . Hyperglycemia without ketosis 02/28/2012  . Chest pain 12/04/2011  . Diabetes type 1, uncontrolled 11/14/2011  . Hypertension associated with diabetes 11/14/2011  . Gastroparesis 11/14/2011  . Hyponatremia 09/19/2011  . Abdominal pain 09/18/2011  . Metabolic alkalosis 065/68/1275 . Diabetic hyperosmolar non-ketotic state 09/18/2011  . DIAB W/UNSPEC COMP TYPE I [JUV TYPE] UNCNTRL 06/04/2010  . CONSTIPATION 01/01/2010  . RECTAL PAIN 01/01/2010   Objective:    BP 125/82 mmHg  Pulse 106   Temp(Src) 97.8 F (36.6 C)  Wt 196 lb 3.2 oz (88.996 kg)  LMP 05/26/2014 FHT: 150 BPM  Uterine Size: size equals dates     Assessment:    Pregnancy @ 217w5d  Plan:    OBGCT: ordered.  Labs, problem list reviewed and updated 2 hr GTT planned Follow up in 2 weeks.

## 2014-11-30 ENCOUNTER — Telehealth: Payer: Self-pay | Admitting: *Deleted

## 2014-11-30 ENCOUNTER — Ambulatory Visit (HOSPITAL_COMMUNITY)
Admission: RE | Admit: 2014-11-30 | Discharge: 2014-11-30 | Disposition: A | Discharge status: Home / Self Care | Payer: 59 | Source: Ambulatory Visit | Attending: Certified Nurse Midwife | Admitting: Certified Nurse Midwife

## 2014-11-30 DIAGNOSIS — Z3A26 26 weeks gestation of pregnancy: Secondary | ICD-10-CM

## 2014-11-30 DIAGNOSIS — O10012 Pre-existing essential hypertension complicating pregnancy, second trimester: Secondary | ICD-10-CM | POA: Diagnosis not present

## 2014-11-30 DIAGNOSIS — O283 Abnormal ultrasonic finding on antenatal screening of mother: Secondary | ICD-10-CM | POA: Diagnosis not present

## 2014-11-30 DIAGNOSIS — Z3A36 36 weeks gestation of pregnancy: Secondary | ICD-10-CM | POA: Diagnosis not present

## 2014-11-30 DIAGNOSIS — O289 Unspecified abnormal findings on antenatal screening of mother: Secondary | ICD-10-CM

## 2014-11-30 DIAGNOSIS — O24012 Pre-existing diabetes mellitus, type 1, in pregnancy, second trimester: Secondary | ICD-10-CM | POA: Diagnosis not present

## 2014-11-30 NOTE — Telephone Encounter (Signed)
Patient state she is using phenergan for break through nausea on the Diclegis and she is requesting a refill. Told patient I would check with her provider.

## 2014-12-01 ENCOUNTER — Other Ambulatory Visit: Payer: Self-pay | Admitting: *Deleted

## 2014-12-01 MED ORDER — PROMETHAZINE HCL 25 MG PO TABS: 12.5000 mg | ORAL_TABLET | Freq: Four times a day (QID) | ORAL | Status: DC | PRN

## 2014-12-01 NOTE — Telephone Encounter (Signed)
OK for refill.

## 2014-12-12 ENCOUNTER — Ambulatory Visit (INDEPENDENT_AMBULATORY_CARE_PROVIDER_SITE_OTHER): Payer: 59 | Admitting: Obstetrics

## 2014-12-12 VITALS — BP 113/77 | HR 107 | Temp 98.0°F | Wt 202.0 lb

## 2014-12-12 DIAGNOSIS — O0993 Supervision of high risk pregnancy, unspecified, third trimester: Secondary | ICD-10-CM

## 2014-12-12 NOTE — Progress Notes (Signed)
Subjective:    [name] is a 24 y.o. female being seen today for her obstetrical visit. She is at [redacted]w[redacted]d gestation. Patient reports heartburn, relieved by Tums.  Fetal movement: normal.  Problem List Items Addressed This Visit    None    Visit Diagnoses    Encounter for supervision of other normal pregnancy in third trimester    -  Primary      Patient Active Problem List   Diagnosis Date Noted  . Abnormal maternal serum screening test 10/05/2014  . High risk pregnancy with low PAPPA 10/05/2014  . Pre-existing essential hypertension complicating pregnancy in second trimester   . Abnormal first trimester screen   . Encounter for fetal anatomic survey   . [redacted] weeks gestation of pregnancy   . Encounter for (NT) nuchal translucency scan   . First trimester screening   . [redacted] weeks gestation of pregnancy   . Pre-existing type 1 diabetes mellitus during pregnancy in first trimester   . Hypertension in pregnancy   . DKA (diabetic ketoacidoses) 06/11/2013  . DKA, type 1 06/11/2013  . Orthostatic hypotension dysautonomic syndrome 04/28/2013  . Body aches 04/28/2013  . Orthostatic hypotension 04/28/2013  . Diabetes mellitus type 1 04/28/2013  . Protein-calorie malnutrition, severe 04/28/2013  . Noncompliance with diabetes treatment 09/01/2012  . Epigastric abdominal pain 03/15/2012  . Dyspnea 02/28/2012  . Sinus tachycardia 02/28/2012  . Hyperglycemia without ketosis 02/28/2012  . Chest pain 12/04/2011  . Diabetes type 1, uncontrolled 11/14/2011  . Hypertension associated with diabetes 11/14/2011  . Gastroparesis 11/14/2011  . Hyponatremia 09/19/2011  . Abdominal pain 09/18/2011  . Metabolic alkalosis 09/18/2011  . Diabetic hyperosmolar non-ketotic state 09/18/2011  . DIAB W/UNSPEC COMP TYPE I [JUV TYPE] UNCNTRL 06/04/2010  . CONSTIPATION 01/01/2010  . RECTAL PAIN 01/01/2010   Objective:    BP 113/77 mmHg  Pulse 107  Temp(Src) 98 F (36.7 C)  Wt 202 lb (91.627 kg)  LMP  05/26/2014 FHT:  150 BPM  Uterine Size: size equals dates  Presentation: unsure     Assessment:    Pregnancy @ [redacted]w[redacted]d weeks   Plan:     labs reviewed, problem list updated Consent signed. GBS sent TDAP offered  Rhogam given for RH negative Pediatrician: discussed. Infant feeding: plans to breastfeed. Maternity leave: discussed. Cigarette smoking: never smoked. No orders of the defined types were placed in this encounter.   No orders of the defined types were placed in this encounter.   Follow up in 2 Weeks.

## 2014-12-26 ENCOUNTER — Encounter (HOSPITAL_COMMUNITY): Payer: Self-pay | Admitting: Obstetrics

## 2014-12-26 ENCOUNTER — Ambulatory Visit (INDEPENDENT_AMBULATORY_CARE_PROVIDER_SITE_OTHER): Payer: 59 | Admitting: Obstetrics

## 2014-12-26 VITALS — BP 129/91 | HR 104 | Wt 203.0 lb

## 2014-12-26 DIAGNOSIS — Z3403 Encounter for supervision of normal first pregnancy, third trimester: Secondary | ICD-10-CM

## 2014-12-26 LAB — POCT URINALYSIS DIPSTICK
Bilirubin, UA: NEGATIVE
Blood, UA: NEGATIVE
Glucose, UA: 250
Leukocytes, UA: NEGATIVE
Nitrite, UA: NEGATIVE
Spec Grav, UA: 1.02
Urobilinogen, UA: NEGATIVE
pH, UA: 5

## 2014-12-27 ENCOUNTER — Encounter: Payer: Self-pay | Admitting: Obstetrics

## 2014-12-27 NOTE — Progress Notes (Signed)
Subjective:    [name] is a 24 y.o. female being seen today for her obstetrical visit. She is at [redacted]w[redacted]d gestation. Patient reports no complaints. Fetal movement: normal.  Problem List Items Addressed This Visit    None    Visit Diagnoses    Encounter for supervision of normal first pregnancy in third trimester    -  Primary    Relevant Orders    POCT urinalysis dipstick (Completed)      Patient Active Problem List   Diagnosis Date Noted  . Abnormal maternal serum screening test 10/05/2014  . High risk pregnancy with low PAPPA 10/05/2014  . Pre-existing essential hypertension complicating pregnancy in second trimester   . Abnormal first trimester screen   . Encounter for fetal anatomic survey   . [redacted] weeks gestation of pregnancy   . Encounter for (NT) nuchal translucency scan   . First trimester screening   . [redacted] weeks gestation of pregnancy   . Pre-existing type 1 diabetes mellitus during pregnancy in first trimester   . Hypertension in pregnancy   . DKA (diabetic ketoacidoses) 06/11/2013  . DKA, type 1 06/11/2013  . Orthostatic hypotension dysautonomic syndrome 04/28/2013  . Body aches 04/28/2013  . Orthostatic hypotension 04/28/2013  . Diabetes mellitus type 1 04/28/2013  . Protein-calorie malnutrition, severe 04/28/2013  . Noncompliance with diabetes treatment 09/01/2012  . Epigastric abdominal pain 03/15/2012  . Dyspnea 02/28/2012  . Sinus tachycardia 02/28/2012  . Hyperglycemia without ketosis 02/28/2012  . Chest pain 12/04/2011  . Diabetes type 1, uncontrolled 11/14/2011  . Hypertension associated with diabetes 11/14/2011  . Gastroparesis 11/14/2011  . Hyponatremia 09/19/2011  . Abdominal pain 09/18/2011  . Metabolic alkalosis 09/18/2011  . Diabetic hyperosmolar non-ketotic state 09/18/2011  . DIAB W/UNSPEC COMP TYPE I [JUV TYPE] UNCNTRL 06/04/2010  . CONSTIPATION 01/01/2010  . RECTAL PAIN 01/01/2010   Objective:    BP 129/91 mmHg  Pulse 104  Wt 203 lb  (92.08 kg)  LMP 05/26/2014 FHT:  150 BPM  Uterine Size: size equals dates  Presentation: unsure     Assessment:    Pregnancy @ [redacted]w[redacted]d weeks   Plan:     labs reviewed, problem list updated Consent signed. GBS sent TDAP offered  Rhogam given for RH negative Pediatrician: discussed. Infant feeding: plans to breastfeed. Maternity leave: discussed. Cigarette smoking: never smoked. Orders Placed This Encounter  Procedures  . POCT urinalysis dipstick   No orders of the defined types were placed in this encounter.   Follow up in 2 Weeks.

## 2014-12-28 ENCOUNTER — Other Ambulatory Visit (HOSPITAL_COMMUNITY): Payer: Self-pay | Admitting: Maternal and Fetal Medicine

## 2014-12-28 ENCOUNTER — Ambulatory Visit (HOSPITAL_COMMUNITY)
Admission: RE | Admit: 2014-12-28 | Discharge: 2014-12-28 | Disposition: A | Discharge status: Home / Self Care | Payer: 59 | Source: Ambulatory Visit | Attending: Maternal and Fetal Medicine | Admitting: Maternal and Fetal Medicine

## 2014-12-28 ENCOUNTER — Encounter (HOSPITAL_COMMUNITY): Payer: Self-pay

## 2014-12-28 VITALS — BP 114/76 | HR 105

## 2014-12-28 VITALS — BP 129/96 | HR 102 | Wt 203.0 lb

## 2014-12-28 DIAGNOSIS — O280 Abnormal hematological finding on antenatal screening of mother: Secondary | ICD-10-CM

## 2014-12-28 DIAGNOSIS — O24012 Pre-existing diabetes mellitus, type 1, in pregnancy, second trimester: Secondary | ICD-10-CM

## 2014-12-28 DIAGNOSIS — O289 Unspecified abnormal findings on antenatal screening of mother: Secondary | ICD-10-CM

## 2014-12-28 DIAGNOSIS — O10013 Pre-existing essential hypertension complicating pregnancy, third trimester: Secondary | ICD-10-CM | POA: Insufficient documentation

## 2014-12-28 DIAGNOSIS — Z3A30 30 weeks gestation of pregnancy: Secondary | ICD-10-CM

## 2014-12-28 DIAGNOSIS — O24013 Pre-existing diabetes mellitus, type 1, in pregnancy, third trimester: Secondary | ICD-10-CM | POA: Insufficient documentation

## 2014-12-28 DIAGNOSIS — O10012 Pre-existing essential hypertension complicating pregnancy, second trimester: Secondary | ICD-10-CM

## 2014-12-28 DIAGNOSIS — O10913 Unspecified pre-existing hypertension complicating pregnancy, third trimester: Secondary | ICD-10-CM

## 2014-12-28 DIAGNOSIS — O09899 Supervision of other high risk pregnancies, unspecified trimester: Secondary | ICD-10-CM

## 2014-12-28 IMAGING — US US MFM OB FOLLOW-UP
1 series · 14 of 28 positions shown · non-contrast
Comparison: none

[Series 1: us mfm ob follow-up · 31 acquisitions, 14 frames shown]
[im 2/31]
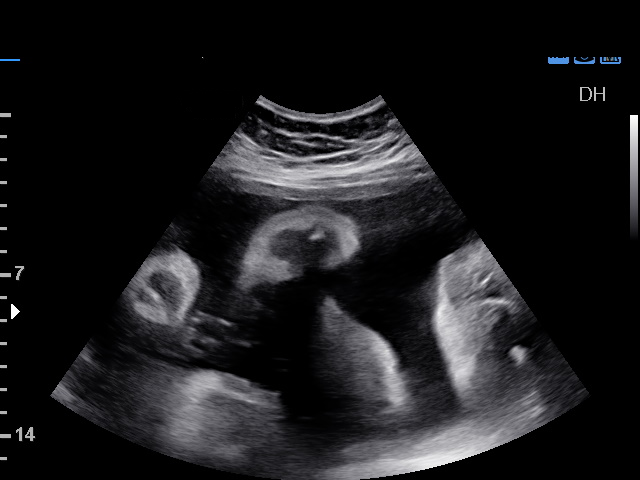
[im 4/31]
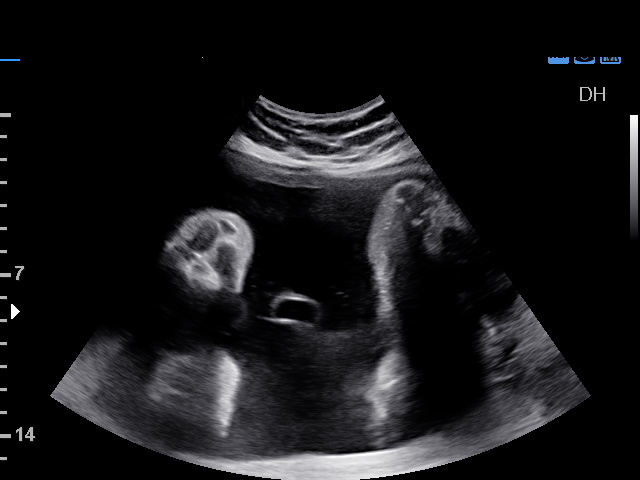
[im 6/31]
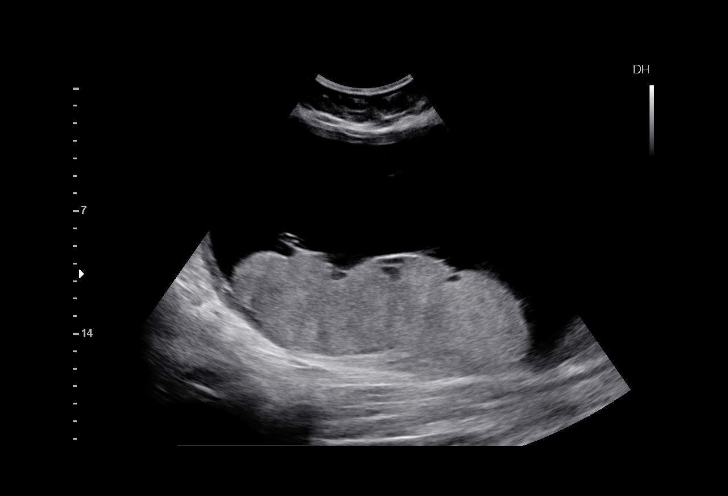
[im 8/31]
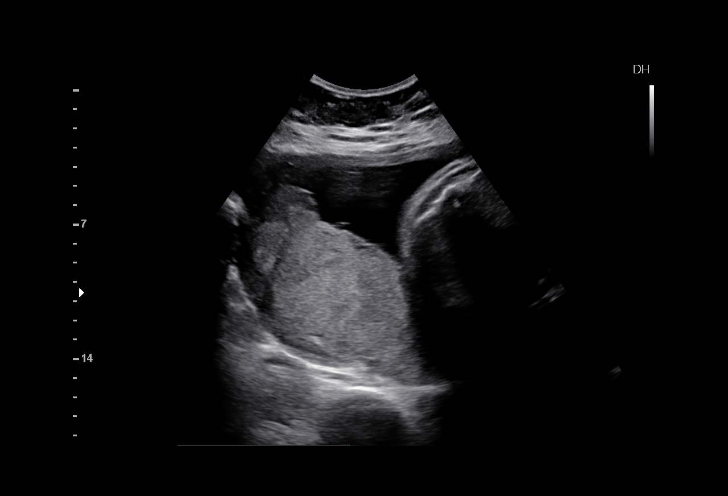
[im 11/31]
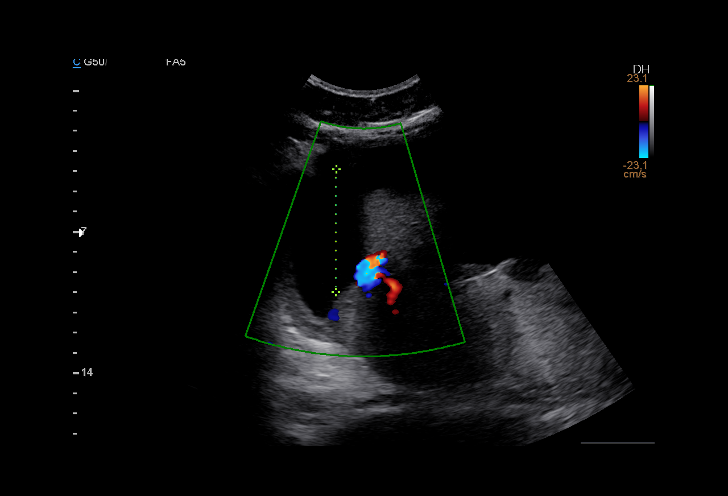
[im 13/31]
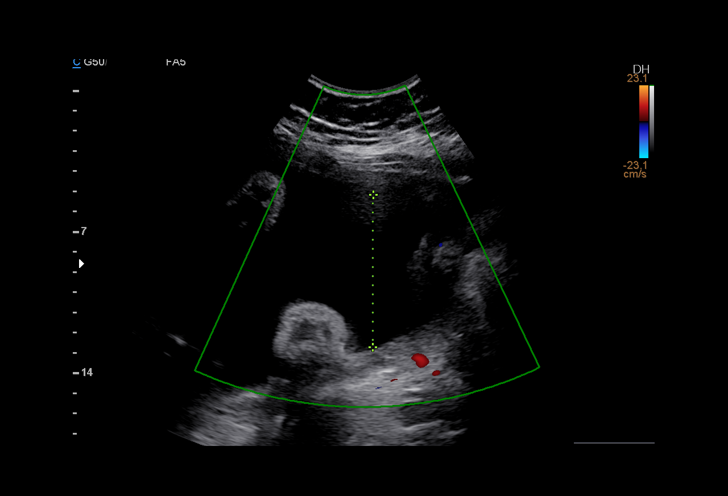
[im 15/31]
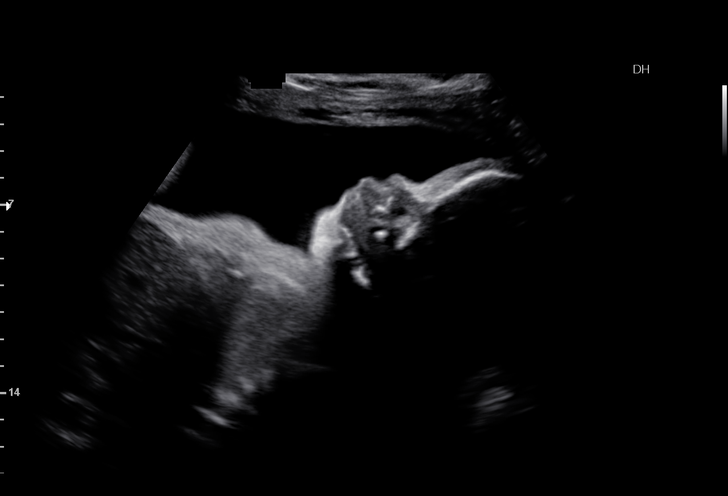
[im 17/31]
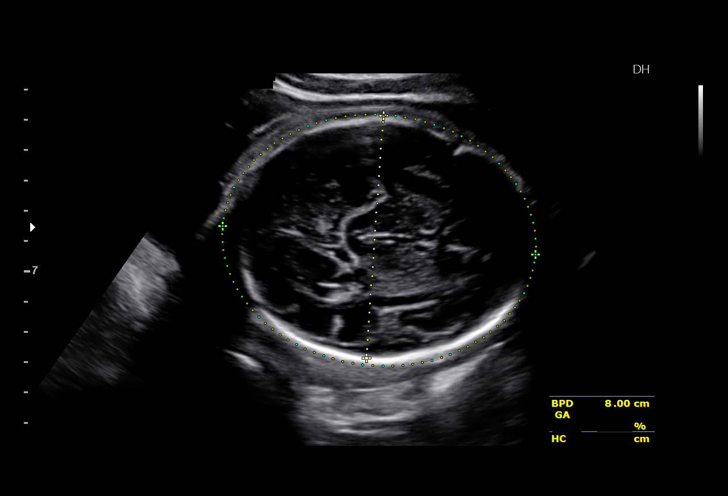
[im 19/31]
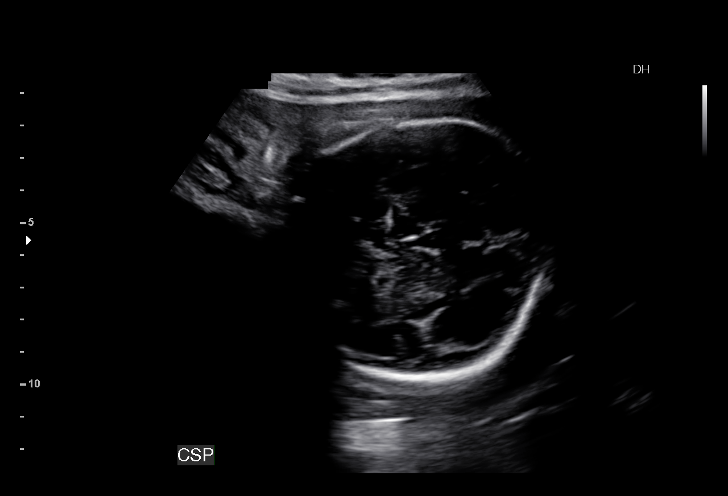
[im 22/31]
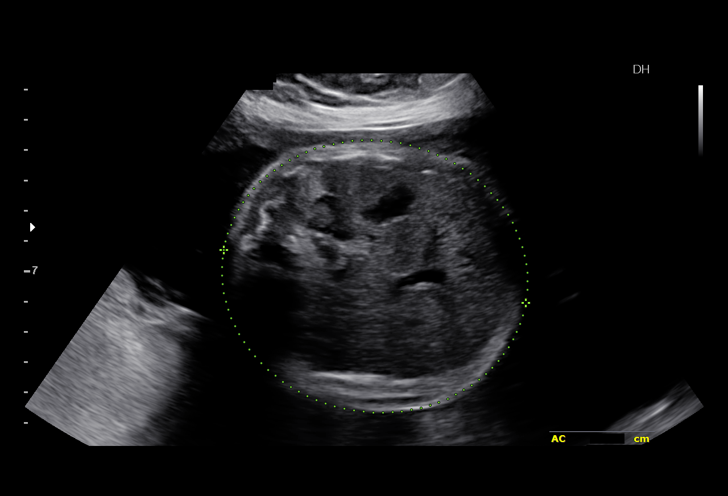
[im 24/31]
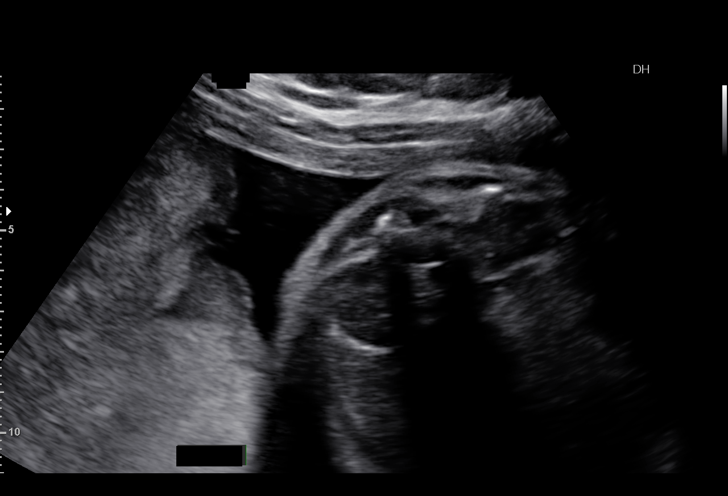
[im 26/31]
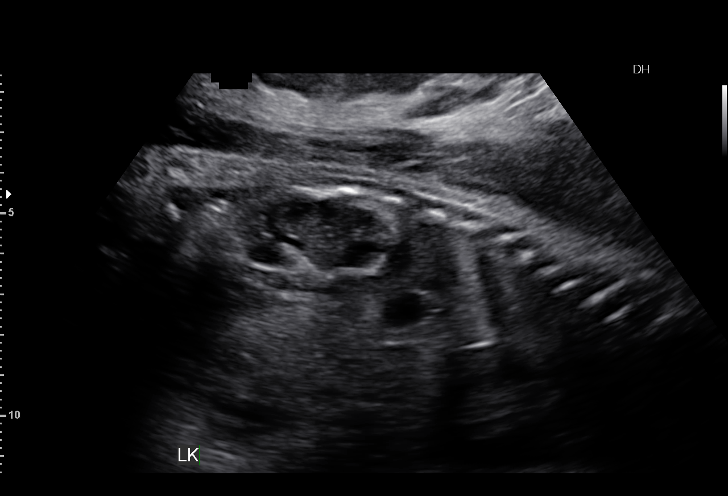
[im 28/31]
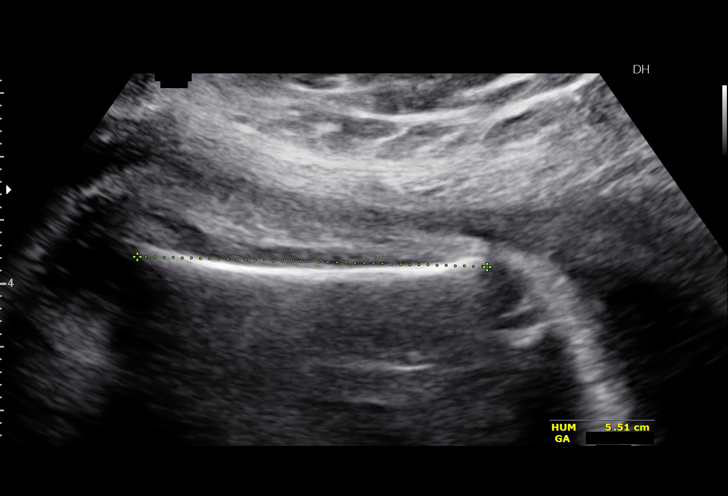
[im 31/31]
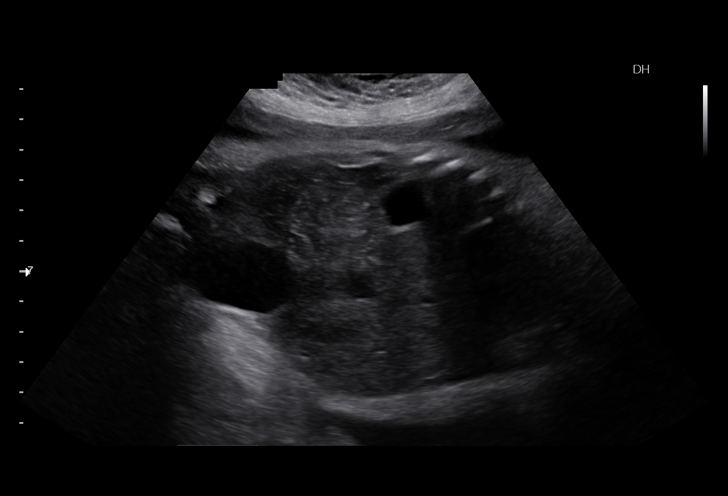

[14 of 28 positions shown; findings below may reference images not displayed]

OBSTETRICS REPORT
(Corrected Final [DATE] [DATE])

Service(s) Provided

Indications

30 weeks gestation of pregnancy
H/O Pre-existing essential hypertension               [42]
complicating pregnancy, third trimester
Pre-existing diabetes, type 1?, in pregnancy, third   [42]
trimester - normal fetal ECHO; on insulin; sees Dr.
BANJEET, endocrinologist
Abnormal first trimester screen, DSR [DATE], s/p       [42]
BANJEET; declined testing, BANJEET
Fetal Evaluation

Num Of Fetuses:    1
Fetal Heart Rate:  169                          bpm
Cardiac Activity:  Observed
Presentation:      Cephalic
Placenta:          Posterior, above cervical
os
P. Cord            Previously Visualized
Insertion:

Amniotic Fluid
AFI FV:      Polyhydramnios
AFI Sum:     30.09   cm     > 97  %Tile     Larg Pckt:    9.86  cm
RUQ:   6.09    cm   RLQ:    9.86   cm    LUQ:   6.59    cm   LLQ:    7.55   cm
Biophysical Evaluation

Amniotic F.V:   Polyhydramnios             F. Tone:        Observed
F. Movement:    Observed                   Score:          [DATE]
F. Breathing:   Not Observed
Biometry

BPD:     80.3  mm     G. Age:  32w 2d                CI:         75.8   70 - 86
FL/HC:      19.6   19.3 -
21.3
HC:     292.4  mm     G. Age:  32w 2d       53  %    HC/AC:      1.00   0.96 -
1.17
AC:     293.3  mm     G. Age:  33w 2d       96  %    FL/BPD:     71.2   71 - 87
FL:      57.2  mm     G. Age:  30w 0d       16  %    FL/AC:      19.5   20 - 24
HUM:     54.2  mm     G. Age:  31w 4d       66  %
Est. FW:    [42]  gm      4 lb 3 oz     77  %
Gestational Age

LMP:           30w 6d        Date:  [DATE]                 EDD:   [DATE]
U/S Today:     32w 0d                                        EDD:   [DATE]
Best:          30w 6d     Det. By:  LMP  ([DATE])          EDD:   [DATE]
Anatomy

Cranium:          Appears normal         Aortic Arch:      Previously seen
Fetal Cavum:      Appears normal         Ductal Arch:      Previously seen
Ventricles:       Appears normal         Diaphragm:        Previously seen
Choroid Plexus:   Previously seen        Stomach:          Appears normal, left
sided
Cerebellum:       Previously seen        Abdomen:          Appears normal
Posterior Fossa:  Previously seen        Abdominal Wall:   Previously seen
Nuchal Fold:      Previously seen        Cord Vessels:     Previously seen
Face:             Orbits and profile     Kidneys:          Appear normal
previously seen
Lips:             Previously seen        Bladder:          Appears normal
Palate:           Previously seen        Spine:            Previously seen
Heart:            Appears normal         Lower             Previously seen
(4CH, axis, and        Extremities:
situs)
RVOT:             Previously seen        Upper             Previously seen
Extremities:
LVOT:             Previously seen

Other:  Female gender previously seen. Heels and 5th digit previously
visualized. Technically difficult due to advanced GA and fetal position.
Cervix Uterus Adnexa

Cervix:       Not visualized (advanced GA >[42])
Impression

Single IUP at 30w 6d
Pre-existing diabetes - followed by Endocrinology
The estimated fetal weight today is at the 77th %tile.  The AC
measures at the 96th %tile.
Polyhydramnios noted with an AFI of 30 cm
BPP [DATE] (-2 for absent fetal breathing)
Recommendations

Recommend weekly BPPs due to polyhydramnions, diabetes -
may transition to 2x weekly NSTS with weekly AFIs at 32
weeks
Ultrasound for growth in 4 weeks

questions or concerns.
Attending Physician, BANJEET

## 2014-12-30 ENCOUNTER — Inpatient Hospital Stay (HOSPITAL_COMMUNITY)
Admission: AD | Admit: 2014-12-30 | Discharge: 2014-12-31 | Disposition: A | Discharge status: Home / Self Care | Payer: 59 | Source: Ambulatory Visit | Attending: Obstetrics | Admitting: Obstetrics

## 2014-12-30 ENCOUNTER — Encounter (HOSPITAL_COMMUNITY): Payer: Self-pay | Admitting: *Deleted

## 2014-12-30 DIAGNOSIS — Z3A31 31 weeks gestation of pregnancy: Secondary | ICD-10-CM | POA: Diagnosis not present

## 2014-12-30 DIAGNOSIS — O403XX1 Polyhydramnios, third trimester, fetus 1: Secondary | ICD-10-CM

## 2014-12-30 DIAGNOSIS — O24013 Pre-existing diabetes mellitus, type 1, in pregnancy, third trimester: Secondary | ICD-10-CM | POA: Diagnosis not present

## 2014-12-30 DIAGNOSIS — O289 Unspecified abnormal findings on antenatal screening of mother: Secondary | ICD-10-CM

## 2014-12-30 DIAGNOSIS — R12 Heartburn: Secondary | ICD-10-CM | POA: Diagnosis not present

## 2014-12-30 DIAGNOSIS — O403XX0 Polyhydramnios, third trimester, not applicable or unspecified: Secondary | ICD-10-CM | POA: Diagnosis not present

## 2014-12-30 DIAGNOSIS — E109 Type 1 diabetes mellitus without complications: Secondary | ICD-10-CM | POA: Diagnosis not present

## 2014-12-30 DIAGNOSIS — O10913 Unspecified pre-existing hypertension complicating pregnancy, third trimester: Secondary | ICD-10-CM | POA: Diagnosis not present

## 2014-12-30 DIAGNOSIS — O2413 Pre-existing diabetes mellitus, type 2, in the puerperium: Secondary | ICD-10-CM

## 2014-12-30 DIAGNOSIS — Z794 Long term (current) use of insulin: Secondary | ICD-10-CM | POA: Insufficient documentation

## 2014-12-30 DIAGNOSIS — O219 Vomiting of pregnancy, unspecified: Secondary | ICD-10-CM

## 2014-12-30 DIAGNOSIS — O26893 Other specified pregnancy related conditions, third trimester: Secondary | ICD-10-CM

## 2014-12-30 DIAGNOSIS — O288 Other abnormal findings on antenatal screening of mother: Secondary | ICD-10-CM

## 2014-12-30 DIAGNOSIS — O10013 Pre-existing essential hypertension complicating pregnancy, third trimester: Secondary | ICD-10-CM

## 2014-12-30 DIAGNOSIS — R079 Chest pain, unspecified: Secondary | ICD-10-CM | POA: Diagnosis not present

## 2014-12-30 LAB — URINALYSIS, ROUTINE W REFLEX MICROSCOPIC
Bilirubin Urine: NEGATIVE
Glucose, UA: >1000 mg/dL — AB
Hgb urine dipstick: NEGATIVE
Ketones, ur: NEGATIVE mg/dL
Leukocytes, UA: NEGATIVE
Nitrite: NEGATIVE
Protein, ur: NEGATIVE mg/dL
Specific Gravity, Urine: 1.025 (ref 1.005–1.030)
Urobilinogen, UA: 0.2 mg/dL (ref 0.0–1.0)
pH: 6 (ref 5.0–8.0)

## 2014-12-30 LAB — URINE MICROSCOPIC-ADD ON

## 2014-12-30 NOTE — MAU Note (Addendum)
PT  SAYS SHE HAS HAD  NAUSEA  THROUGH  ENTIRE  PREG.    TODAY   SHE STARTED  VOMITING-    X2.   THEN SHE  STARTED HAVING BURNING   IN CHEST-    TOOK TUMS  AND  NOT RELIEF-   ALL HAPPENED  AT   7PM.     HER  CHEST  STILL FEEL  SAME   AS  IT DID  AT 7 PM.     SAYS HAS FELT UC  X2 DAYS-    [facility]-  WITH [facility] .      WAS THERE ON Tuesday AND [facility] ON Thursday-     BC OF DIABETES  AND BP WAS  SLIGHTLY INCREASED.    BLOOD  SUGAR  AT  530PM-   127

## 2014-12-30 NOTE — MAU Note (Addendum)
Nauseated for 6-7hrs. Vomited and had chest pain afterward with SOB. Have had N/V entire pregnancy with some chest pain. Chest pain started about 1500ish. Denies LOF or bleeding

## 2014-12-31 ENCOUNTER — Inpatient Hospital Stay (HOSPITAL_COMMUNITY): Payer: 59

## 2014-12-31 DIAGNOSIS — O26893 Other specified pregnancy related conditions, third trimester: Secondary | ICD-10-CM | POA: Diagnosis not present

## 2014-12-31 DIAGNOSIS — R12 Heartburn: Secondary | ICD-10-CM | POA: Diagnosis not present

## 2014-12-31 LAB — GLUCOSE, CAPILLARY: Glucose-Capillary: 198 mg/dL — ABNORMAL HIGH (ref 65–99)

## 2014-12-31 IMAGING — US US MFM FETAL BPP W/O NON-STRESS
1 series · 15 of 19 positions shown · non-contrast
Comparison: none

[Series 1: us mfm fetal bpp w/o non-stress · 19 acquisitions, 15 frames shown]
[im 1/19]
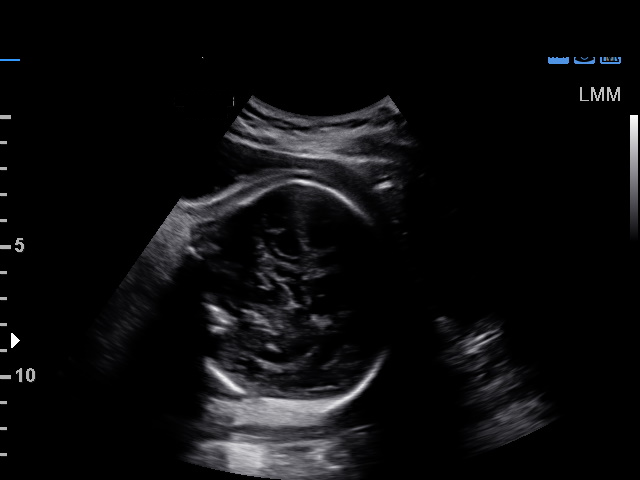
[im 2/19]
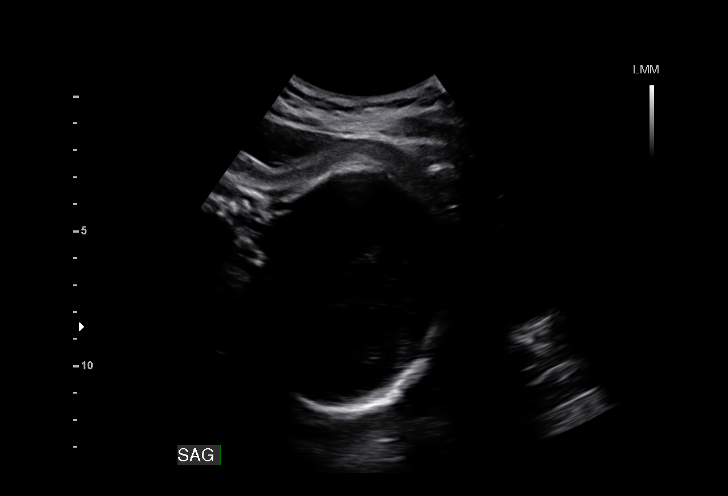
[im 4/19]
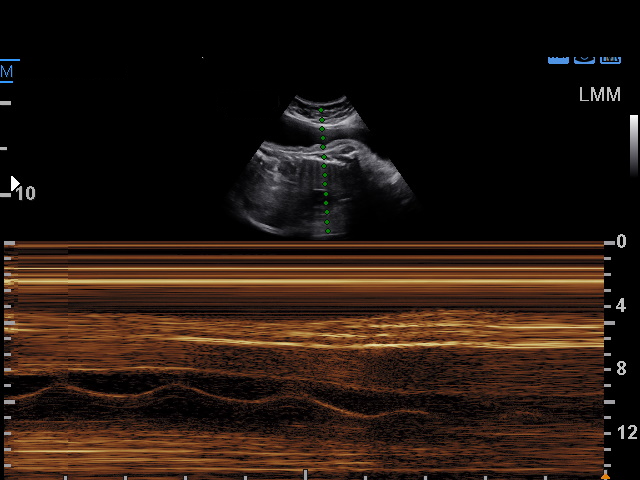
[im 5/19]
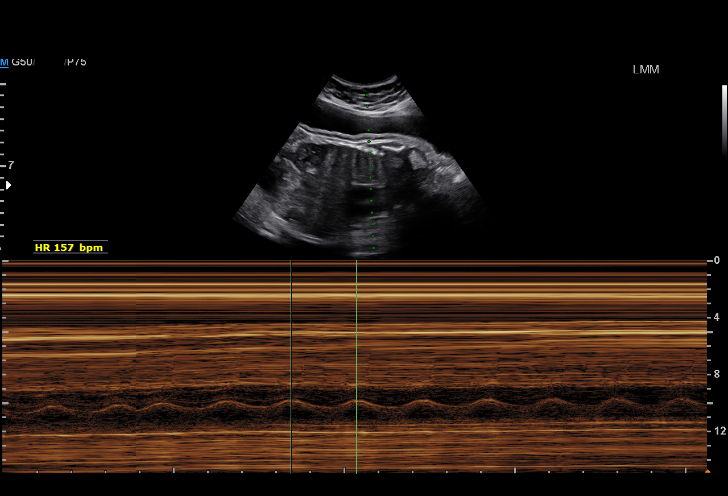
[im 6/19]
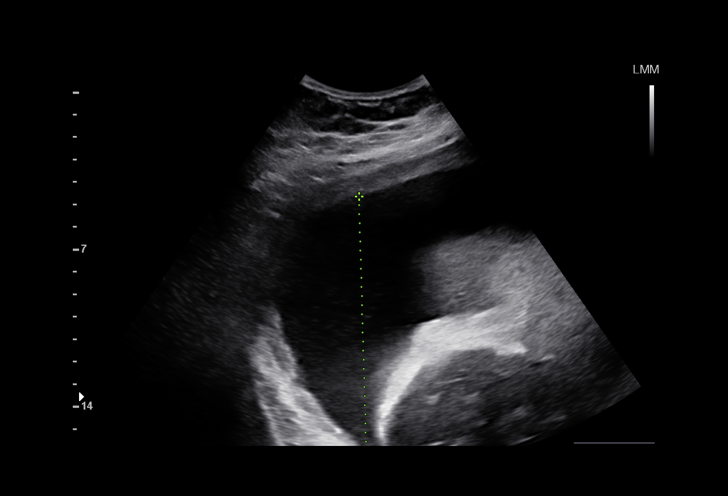
[im 7/19]
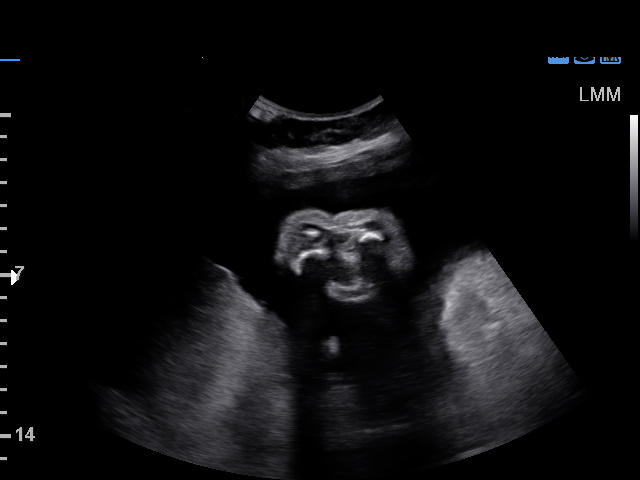
[im 9/19]
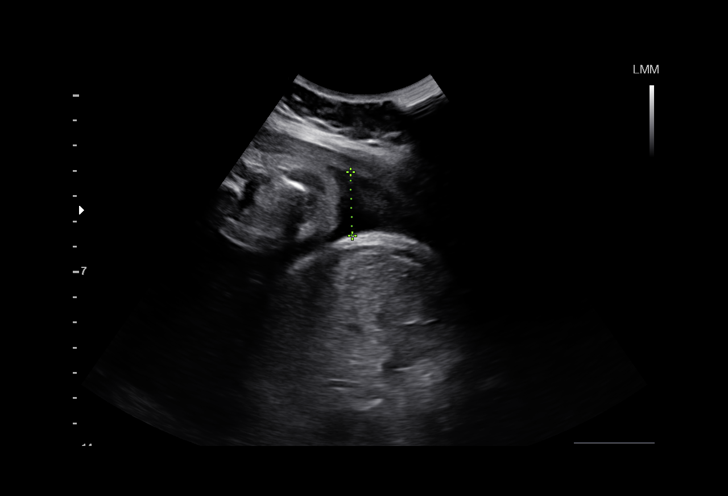
[im 10/19]
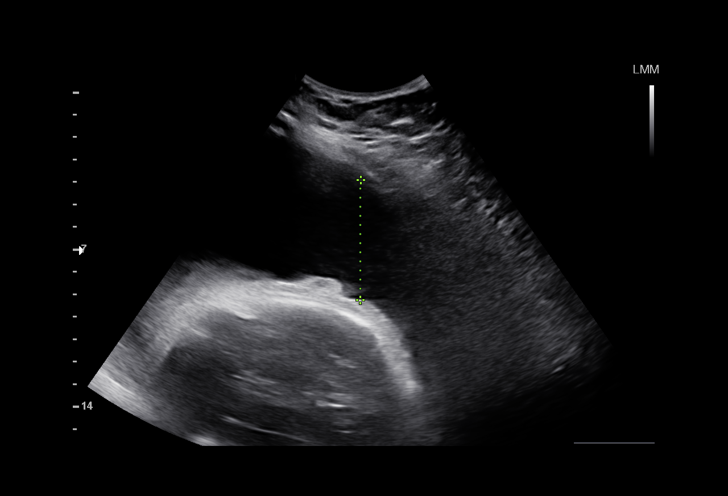
[im 11/19]
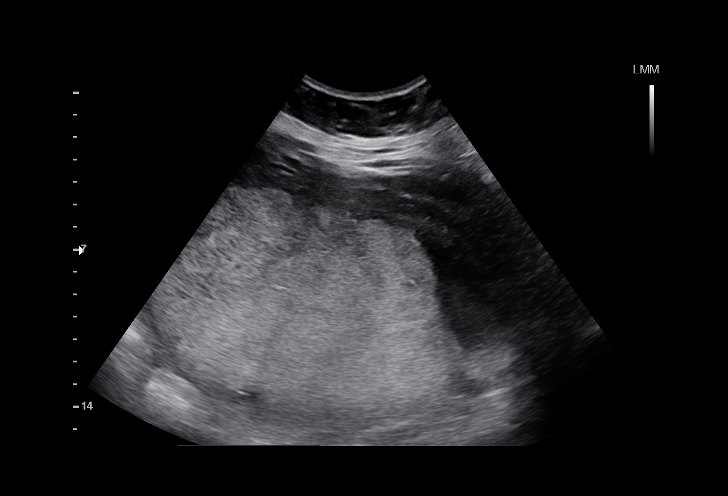
[im 13/19]
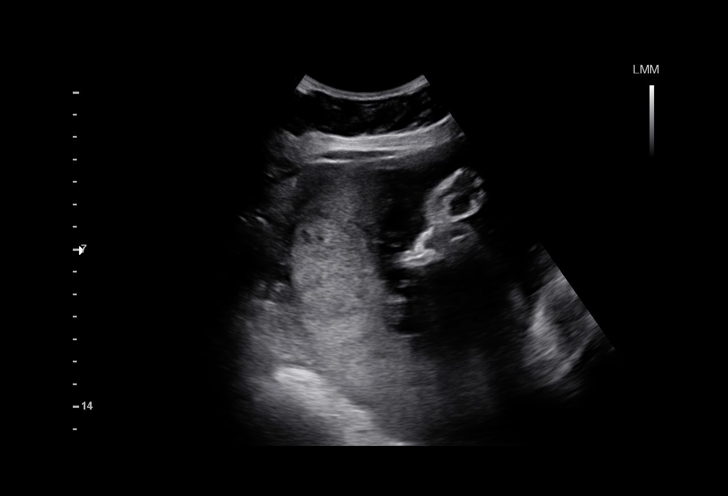
[im 14/19]
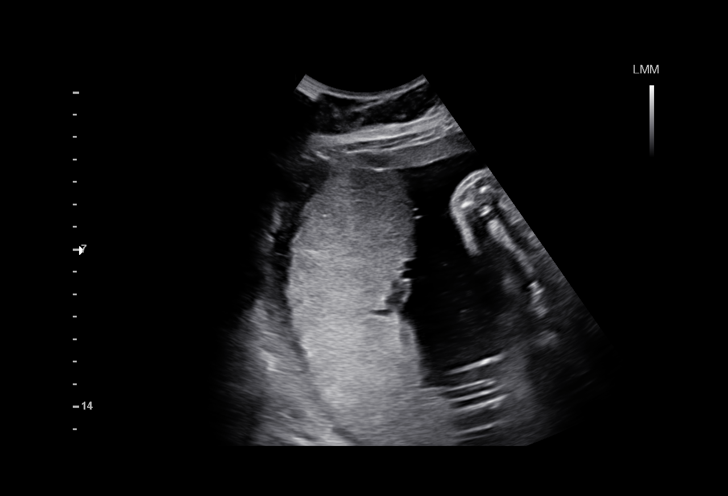
[im 15/19]
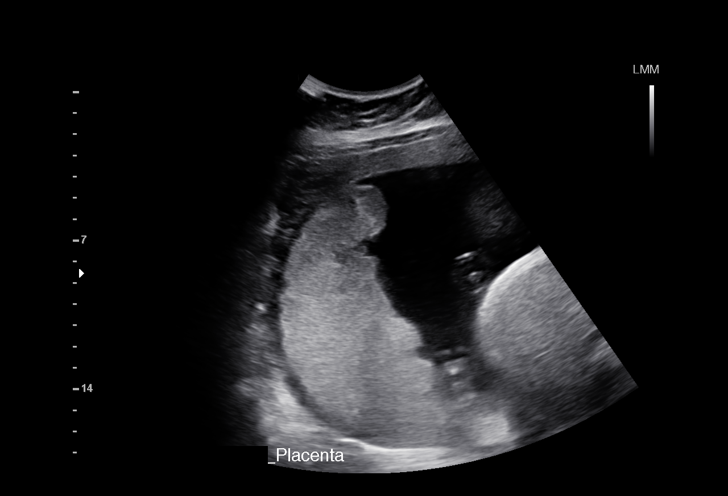
[im 16/19]
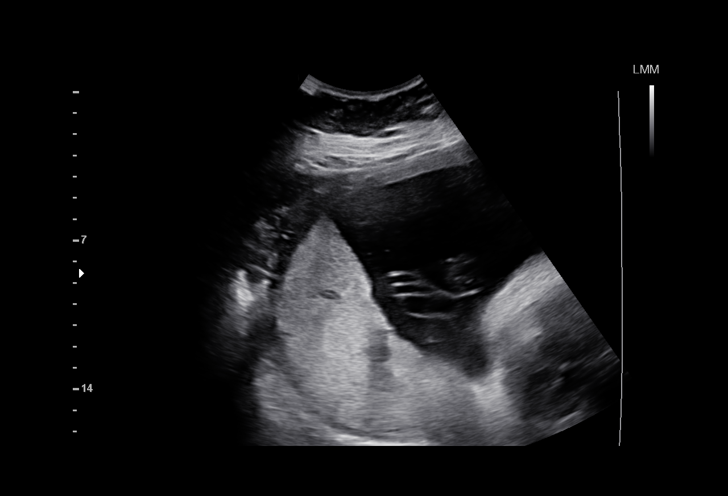
[im 18/19]
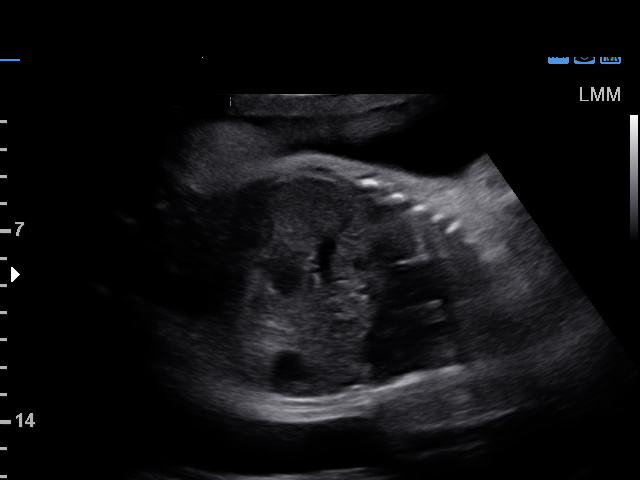
[im 19/19]
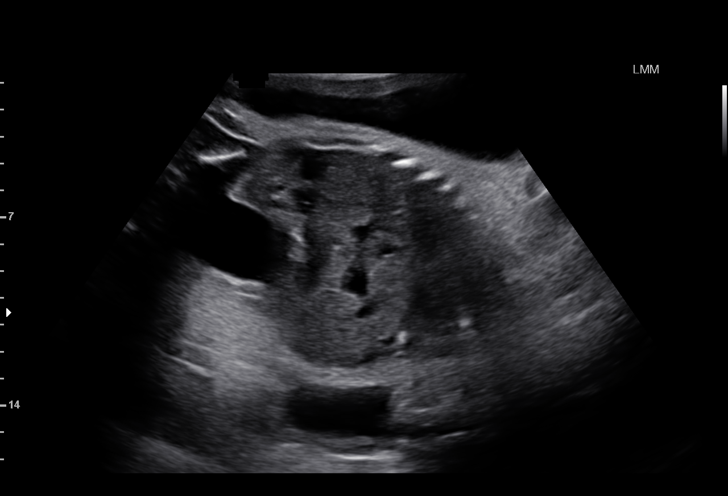

[15 of 19 positions shown; findings below may reference images not displayed]

OBSTETRICS REPORT
(Signed Final [DATE] [DATE])

Service(s) Provided

Indications

Non-reactive NST (with variables)                     [JZ]
31 weeks gestation of pregnancy
H/O Pre-existing essential hypertension               [JZ]
complicating pregnancy, third trimester
Pre-existing diabetes, type 1?, in pregnancy, third   [JZ]
trimester - normal fetal ECHO; on insulin; sees Dr.
ARMANDO PAULO, endocrinologist
Abnormal first trimester screen, DSR [DATE], s/p       [JZ]
ARMANDO PAULO; declined testing, ARMANDO PAULO
Polyhydramnios, third trimester, antepartum           [JZ]
condition or complication, fetus 1
Fetal Evaluation

Num Of Fetuses:    1
Fetal Heart Rate:  157                          bpm
Cardiac Activity:  Observed
Presentation:      Cephalic
Placenta:          Right lateral, above
cervical os

Amniotic Fluid
AFI FV:      Polyhydramnios
AFI Sum:     27.73   cm     > 97  %Tile     Larg Pckt:  11.48   cm
RUQ:   11.48   cm   RLQ:    5.34   cm    LUQ:   8.39    cm   LLQ:    2.52   cm
Biophysical Evaluation

Amniotic F.V:   Pocket => 2 cm two         F. Tone:        Observed
planes
F. Movement:    Observed                   Score:          [DATE]
F. Breathing:   Not Observed
Gestational Age

LMP:           31w 2d        Date:  [DATE]                 EDD:   [DATE]
Best:          31w 2d     Det. By:  LMP  ([DATE])          EDD:   [DATE]
Impression

SIUP at [JZ], CHTN and DM (remote read of BPP only)
active singleton fetus in cephalic presentation
BPP [DATE]
Polyhydramnios
Recommendations

1. recommend continuous external fetal monitoring until BPP
is repeated;
2. repeat BPP is warranted within 24 hours but may be done
anytime 6-24 hours from now.
3. management as clinically indicated in the interim.

questions or concerns.

## 2014-12-31 MED ORDER — PROMETHAZINE HCL 25 MG PO TABS
25.0000 mg | ORAL_TABLET | Freq: Once | ORAL | Status: AC
  Administered 2014-12-31: 25 mg via ORAL
  Filled 2014-12-31: qty 1

## 2014-12-31 MED ORDER — FAMOTIDINE 20 MG PO TABS
20.0000 mg | ORAL_TABLET | Freq: Once | ORAL | Status: AC
  Administered 2014-12-31: 20 mg via ORAL
  Filled 2014-12-31: qty 1

## 2014-12-31 MED ORDER — PROMETHAZINE HCL 25 MG PO TABS: 25.0000 mg | ORAL_TABLET | Freq: Four times a day (QID) | ORAL | Status: DC | PRN

## 2014-12-31 NOTE — Discharge Instructions (Signed)
Fetal Movement Counts Patient Name: __________________________________________________ Patient Due Date: ____________________ Performing a fetal movement count is highly recommended in high-risk pregnancies, but it is good for every pregnant woman to do. Your health care provider may ask you to start counting fetal movements at 28 weeks of the pregnancy. Fetal movements often increase:  After eating a full meal.  After physical activity.  After eating or drinking something sweet or cold.  At rest. Pay attention to when you feel the baby is most active. This will help you notice a pattern of your baby's sleep and wake cycles and what factors contribute to an increase in fetal movement. It is important to perform a fetal movement count at the same time each day when your baby is normally most active.  HOW TO COUNT FETAL MOVEMENTS 1. Find a quiet and comfortable area to sit or lie down on your left side. Lying on your left side provides the best blood and oxygen circulation to your baby. 2. Write down the day and time on a sheet of paper or in a journal. 3. Start counting kicks, flutters, swishes, rolls, or jabs in a 2-hour period. You should feel at least 10 movements within 2 hours. 4. If you do not feel 10 movements in 2 hours, wait 2-3 hours and count again. Look for a change in the pattern or not enough counts in 2 hours. SEEK MEDICAL CARE IF:  You feel less than 10 counts in 2 hours, tried twice.  There is no movement in over an hour.  The pattern is changing or taking longer each day to reach 10 counts in 2 hours.  You feel the baby is not moving as he or she usually does. Date: ____________ Movements: ____________ Start time: ____________ [name] time: ____________  Date: ____________ Movements: ____________ Start time: ____________ [name] time: ____________ Date: ____________ Movements: ____________ Start time: ____________ [name] time: ____________ Date: ____________ Movements:  ____________ Start time: ____________ [name] time: ____________ Date: ____________ Movements: ____________ Start time: ____________ [name] time: ____________ Date: ____________ Movements: ____________ Start time: ____________ [name] time: ____________ Date: ____________ Movements: ____________ Start time: ____________ [name] time: ____________ Date: ____________ Movements: ____________ Start time: ____________ [name] time: ____________  Date: ____________ Movements: ____________ Start time: ____________ [name] time: ____________ Date: ____________ Movements: ____________ Start time: ____________ [name] time: ____________ Date: ____________ Movements: ____________ Start time: ____________ [name] time: ____________ Date: ____________ Movements: ____________ Start time: ____________ [name] time: ____________ Date: ____________ Movements: ____________ Start time: ____________ [name] time: ____________ Date: ____________ Movements: ____________ Start time: ____________ [name] time: ____________ Date: ____________ Movements: ____________ Start time: ____________ [name] time: ____________  Date: ____________ Movements: ____________ Start time: ____________ [name] time: ____________ Date: ____________ Movements: ____________ Start time: ____________ [name] time: ____________ Date: ____________ Movements: ____________ Start time: ____________ [name] time: ____________ Date: ____________ Movements: ____________ Start time: ____________ [name] time: ____________ Date: ____________ Movements: ____________ Start time: ____________ [name] time: ____________ Date: ____________ Movements: ____________ Start time: ____________ [name] time: ____________ Date: ____________ Movements: ____________ Start time: ____________ [name] time: ____________  Date: ____________ Movements: ____________ Start time: ____________ [name] time: ____________ Date: ____________ Movements: ____________ Start time: ____________ [name]  time: ____________ Date: ____________ Movements: ____________ Start time: ____________ [name] time: ____________ Date: ____________ Movements: ____________ Start time: ____________ [name] time: ____________ Date: ____________ Movements: ____________ Start time: ____________ [name] time: ____________ Date: ____________ Movements: ____________ Start time: ____________ [name] time: ____________ Date: ____________ Movements: ____________ Start time: ____________ [name] time: ____________  Date: ____________ Movements: ____________ Start time: ____________ [name]  time: ____________ Date: ____________ Movements: ____________ Start time: ____________ [name] time: ____________ Date: ____________ Movements: ____________ Start time: ____________ [name] time: ____________ Date: ____________ Movements: ____________ Start time: ____________ [name] time: ____________ Date: ____________ Movements: ____________ Start time: ____________ [name] time: ____________ Date: ____________ Movements: ____________ Start time: ____________ [name] time: ____________ Date: ____________ Movements: ____________ Start time: ____________ [name] time: ____________  Date: ____________ Movements: ____________ Start time: ____________ [name] time: ____________ Date: ____________ Movements: ____________ Start time: ____________ [name] time: ____________ Date: ____________ Movements: ____________ Start time: ____________ [name] time: ____________ Date: ____________ Movements: ____________ Start time: ____________ [name] time: ____________ Date: ____________ Movements: ____________ Start time: ____________ [name] time: ____________ Date: ____________ Movements: ____________ Start time: ____________ [name] time: ____________ Date: ____________ Movements: ____________ Start time: ____________ [name] time: ____________  Date: ____________ Movements: ____________ Start time: ____________ [name] time: ____________ Date: ____________  Movements: ____________ Start time: ____________ [name] time: ____________ Date: ____________ Movements: ____________ Start time: ____________ [name] time: ____________ Date: ____________ Movements: ____________ Start time: ____________ [name] time: ____________ Date: ____________ Movements: ____________ Start time: ____________ [name] time: ____________ Date: ____________ Movements: ____________ Start time: ____________ [name] time: ____________ Date: ____________ Movements: ____________ Start time: ____________ [name] time: ____________  Date: ____________ Movements: ____________ Start time: ____________ [name] time: ____________ Date: ____________ Movements: ____________ Start time: ____________ [name] time: ____________ Date: ____________ Movements: ____________ Start time: ____________ [name] time: ____________ Date: ____________ Movements: ____________ Start time: ____________ [name] time: ____________ Date: ____________ Movements: ____________ Start time: ____________ [name] time: ____________ Date: ____________ Movements: ____________ Start time: ____________ [name] time: ____________ Document Released: 04/16/2006 Document Revised: 08/01/2013 Document Reviewed: 01/12/2012 Exit[employer] Patient Information 2015 [employer]. This information is not intended to replace advice given to you by your health care provider. Make sure you discuss any questions you have with your health care provider.

## 2014-12-31 NOTE — MAU Provider Note (Signed)
History     CSN: [id number]  Arrival date and time: 12/30/14 2251   First Provider Initiated Contact with Patient 12/31/14 0008      Chief Complaint  Patient presents with  . Heartburn  . Chest Pain  . Emesis During Pregnancy   HPI [name] 24 y.o. G1P0 @[redacted]w[redacted]d  presents with nausea, heartburn and chest pain.    She has had this throughout the pregnancy but it is worse today without her phenergan (ran out). Chest pain is related to heartburn and vomitting.  She has vomited twice today.  She notes good fetal movement.  She denies LOF, dysuria, VB, weakness, sweating.  She has been eating normally today.  She has Type I DM for which she uses insulin. Last blood sugar 126 at 5:30pm.  She usually takes blood sugar again at bedtime prior to evening meds.   Will see Dr. Jodi Mourning in 3 days.  OB History    Gravida Para Term Preterm AB TAB SAB Ectopic Multiple Living   1               Past Medical History  Diagnosis Date  . Diabetes mellitus   . Hypertension   . Diabetes type 1, uncontrolled ([place]) 11/14/2011    Since age 84  . Gastroparesis   . Anxiety   . Infection     UTI April 2016  . Fibromyalgia     Past Surgical History  Procedure Laterality Date  . Ankle surgery    . Cholecystectomy  11/15/2011    Procedure: LAPAROSCOPIC CHOLECYSTECTOMY WITH INTRAOPERATIVE CHOLANGIOGRAM;  Surgeon: [name], MD;  Location: [facility];  Service: General;  Laterality: N/A;  . Laparoscopy  11/23/2011    Procedure: LAPAROSCOPY DIAGNOSTIC;  Surgeon: Edward Jolly, MD;  Location: [facility];  Service: General;  Laterality: N/A;  . Esophagogastroduodenoscopy  12/03/2011    Procedure: ESOPHAGOGASTRODUODENOSCOPY (EGD);  Surgeon: Beryle Beams, MD;  Location: [facility];  Service: Endoscopy;  Laterality: N/A;    Family History  Problem Relation Age of Onset  . Diabetes Mother   . Hypertension Father     Social History  Substance Use Topics  . Smoking status: Never Smoker   .  Smokeless tobacco: Never Used  . Alcohol Use: No     Comment: Occasional, once a month mixed drink- when not pregnant    Allergies:  Allergies  Allergen Reactions  . Peanut-Containing Drug Products Anaphylaxis and Other (See Comments)    Pt states that she is allergic to [place] nuts.        Prescriptions prior to admission  Medication Sig Dispense Refill Last Dose  . aspirin 81 MG chewable tablet Chew 1 tablet (81 mg total) by mouth daily. 30 tablet 12 12/29/2014 at Unknown time  . Cholecalciferol (VITAMIN D PO) Take 2 each by mouth daily.    12/30/2014 at Unknown time  . doxylamine, Sleep, (UNISOM) 25 MG tablet Take 25-50 mg by mouth at bedtime as needed for sleep.   Past Week at Unknown time  . insulin detemir (LEVEMIR) 100 UNIT/ML injection Inject 14 Units into the skin 2 (two) times daily.   12/30/2014 at 0630  . insulin lispro (HUMALOG) 100 UNIT/ML injection Inject 2-12 Units into the skin 3 (three) times daily as needed for high blood sugar. Pt uses as needed per sliding scale.   12/30/2014 at 1730  . promethazine (PHENERGAN) 25 MG tablet Take 0.5-1 tablets (12.5-25 mg total) by mouth every 6 (six) hours  as needed. 30 tablet 0 12/30/2014 at 0630  . acetaminophen (TYLENOL) 500 MG tablet Take 1,000 mg by mouth every 6 (six) hours as needed for mild pain or headache.    More than a month at Unknown time  . butalbital-acetaminophen-caffeine (FIORICET) 50-325-40 MG per tablet Take 1-2 tablets by mouth every 6 (six) hours as needed for headache. 20 tablet 0 More than a month at Unknown time  . Doxylamine-Pyridoxine 10-10 MG TBEC Start with 2 tablets every evening. If symptoms persist, add 1 tablet every morning. If symptoms persist, add 1 tablet at mid-day. (Patient taking differently: Take 2 tablets by mouth at bedtime. ) 60 tablet 3 Taking  . EPINEPHrine (EPIPEN 2-PAK) 0.3 mg/0.3 mL IJ SOAJ injection Inject 0.3 mLs (0.3 mg total) into the muscle once. (Patient taking differently: Inject 0.3 mg  into the muscle once as needed (for severe allergic reaction). ) 1 Device 1 Taking  . glucose 4 GM chewable tablet Chew 1 tablet (4 g total) by mouth as needed for low blood sugar. 50 tablet 12 Taking  . ketotifen (ZADITOR) 0.025 % ophthalmic solution Place 1 drop into both eyes 2 (two) times daily as needed (dry eyes).   More than a month at Unknown time  . Prenat w/o A-FeCbn-Meth-FA-DHA (OB COMPLETE GOLD) 27.5-1-200 MG CAPS Take 1 tablet by mouth daily. (Patient taking differently: Take 1 capsule by mouth daily. ) 30 capsule 12 Taking  . promethazine (PHENERGAN) 25 MG tablet Take 1 tablet (25 mg total) by mouth every 6 (six) hours as needed. (Patient taking differently: Take 25 mg by mouth every 6 (six) hours as needed for nausea or vomiting. ) 30 tablet 2 Taking    ROS Pertinent ROS in HPI.  All other systems are negative.   Physical Exam   Blood pressure 107/72, pulse 110, temperature 98.1 F (36.7 C), resp. rate 18, height 5' 7"  (1.702 m), weight 206 lb 6.4 oz (93.622 kg), last menstrual period 05/26/2014, SpO2 100 %.  Physical Exam  Constitutional: She is oriented to person, place, and time. She appears well-developed and well-nourished. No distress.  HENT:  Head: Normocephalic and atraumatic.  Eyes: EOM are normal.  Neck: Normal range of motion.  Cardiovascular: Normal rate.   Respiratory: Breath sounds normal. No respiratory distress.  GI: Soft. She exhibits no distension. There is no tenderness.  Musculoskeletal: Normal range of motion.  Neurological: She is alert and oriented to person, place, and time.  Skin: Skin is warm and dry.  Psychiatric: She has a normal mood and affect.    MAU Course  Procedures  MDM PO Phenergan and Pepcid ordered to address symptoms.   CBG obtained to eval diabetes (198) Pt notes improvement with these.   Fetal Tracing: Baseline:150 Variability:mod Accelerations: 10x10s Decelerations:variables noted Toco:n/a  Due to variables - BPP  ordered.  Per sonographer - BPP 6/8 Dr. Ruthann Cancer consulted.  He is agreeable to discharge of pt to home now that symptoms are resolved and no further workup required of the variables or 6/8 BPP.    Assessment and Plan  A:  1. Heartburn in pregnancy in third trimester   2. NST (non-stress test) nonreactive   3. [redacted] weeks gestation of pregnancy   4. Nausea/vomiting in pregnancy   5. Pre-existing essential hypertension complicating pregnancy in third trimester   6. 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 Abnormal findings on antenatal screening   8. Polyhydramnios in third trimester, antepartum, fetus 1  P: Discharge to home Phenergan Rx for nausea Advise for OTC zantac prn heartburn Keep f/u with Dr. Jodi Mourning in 2 days Monitor fetal movement.  Return asap for decreased movement.  Manage blood sugar per existing orders Patient may return to MAU as needed or if her condition were to change or worsen    Paticia Stack 12/31/2014, 12:09 AM

## 2015-01-02 ENCOUNTER — Ambulatory Visit (INDEPENDENT_AMBULATORY_CARE_PROVIDER_SITE_OTHER): Payer: 59 | Admitting: Obstetrics

## 2015-01-02 VITALS — BP 120/82 | HR 108 | Wt 210.0 lb

## 2015-01-02 DIAGNOSIS — K219 Gastro-esophageal reflux disease without esophagitis: Secondary | ICD-10-CM

## 2015-01-02 DIAGNOSIS — Z3403 Encounter for supervision of normal first pregnancy, third trimester: Secondary | ICD-10-CM

## 2015-01-02 LAB — POCT URINALYSIS DIPSTICK
Bilirubin, UA: NEGATIVE
Blood, UA: NEGATIVE
Glucose, UA: 500
Ketones, UA: NEGATIVE
Leukocytes, UA: NEGATIVE
Nitrite, UA: NEGATIVE
Protein, UA: NEGATIVE
Spec Grav, UA: 1.015
Urobilinogen, UA: NEGATIVE
pH, UA: 6

## 2015-01-02 MED ORDER — RANITIDINE HCL 150 MG PO CAPS: 150.0000 mg | ORAL_CAPSULE | Freq: Two times a day (BID) | ORAL | Status: DC

## 2015-01-03 ENCOUNTER — Encounter: Payer: Self-pay | Admitting: Obstetrics

## 2015-01-03 NOTE — Progress Notes (Signed)
Subjective:    [name] is a 24 y.o. female being seen today for her obstetrical visit. She is at [redacted]w[redacted]d gestation. Patient reports no complaints. Fetal movement: normal.  Problem List Items Addressed This Visit    None    Visit Diagnoses    Encounter for supervision of normal first pregnancy in third trimester    -  Primary    Relevant Orders    POCT urinalysis dipstick (Completed)    GERD without esophagitis        Relevant Medications    ranitidine (ZANTAC) 150 MG capsule      Patient Active Problem List   Diagnosis Date Noted  . Abnormal maternal serum screening test 10/05/2014  . High risk pregnancy with low PAPPA 10/05/2014  . Pre-existing essential hypertension complicating pregnancy in second trimester   . Abnormal first trimester screen   . Encounter for fetal anatomic survey   . [redacted] weeks gestation of pregnancy   . Encounter for (NT) nuchal translucency scan   . First trimester screening   . [redacted] weeks gestation of pregnancy   . Pre-existing type 1 diabetes mellitus during pregnancy in first trimester   . Hypertension in pregnancy   . DKA (diabetic ketoacidoses) (HCC) 06/11/2013  . DKA, type 1 (HCC) 06/11/2013  . Orthostatic hypotension dysautonomic syndrome (HCC) 04/28/2013  . Body aches 04/28/2013  . Orthostatic hypotension 04/28/2013  . Diabetes mellitus type 1 (HCC) 04/28/2013  . Protein-calorie malnutrition, severe (HCC) 04/28/2013  . Noncompliance with diabetes treatment 09/01/2012  . Epigastric abdominal pain 03/15/2012  . Dyspnea 02/28/2012  . Sinus tachycardia (HCC) 02/28/2012  . Hyperglycemia without ketosis 02/28/2012  . Chest pain 12/04/2011  . Diabetes type 1, uncontrolled (HCC) 11/14/2011  . Hypertension associated with diabetes (HCC) 11/14/2011  . Gastroparesis 11/14/2011  . Hyponatremia 09/19/2011  . Abdominal pain 09/18/2011  . Metabolic alkalosis 09/18/2011  . Diabetic hyperosmolar non-ketotic state (HCC) 09/18/2011  . DIAB W/UNSPEC COMP  TYPE I [JUV TYPE] UNCNTRL 06/04/2010  . CONSTIPATION 01/01/2010  . RECTAL PAIN 01/01/2010   Objective:    BP 120/82 mmHg  Pulse 108  Wt 210 lb (95.255 kg)  LMP 05/26/2014 FHT:  150 BPM  Uterine Size: size equals dates  Presentation: unsure     Assessment:    Pregnancy @ [redacted]w[redacted]d weeks   Plan:     labs reviewed, problem list updated Consent signed. GBS sent TDAP offered  Rhogam given for RH negative Pediatrician: discussed. Infant feeding: plans to breastfeed. Maternity leave: discussed. Cigarette smoking: never smoked. Orders Placed This Encounter  Procedures  . POCT urinalysis dipstick   Meds ordered this encounter  Medications  . DISCONTD: ranitidine (ZANTAC) 150 MG capsule    Sig: Take 150 mg by mouth 2 (two) times daily.  . ranitidine (ZANTAC) 150 MG capsule    Sig: Take 1 capsule (150 mg total) by mouth 2 (two) times daily.    Dispense:  60 capsule    Refill:  5   Follow up in 2 Weeks.

## 2015-01-05 ENCOUNTER — Ambulatory Visit (HOSPITAL_COMMUNITY)
Admission: RE | Admit: 2015-01-05 | Discharge: 2015-01-05 | Disposition: A | Discharge status: Home / Self Care | Payer: 59 | Source: Ambulatory Visit | Attending: Obstetrics | Admitting: Obstetrics

## 2015-01-05 ENCOUNTER — Encounter (HOSPITAL_COMMUNITY): Payer: Self-pay

## 2015-01-05 ENCOUNTER — Other Ambulatory Visit (HOSPITAL_COMMUNITY): Payer: Self-pay | Admitting: Maternal and Fetal Medicine

## 2015-01-05 DIAGNOSIS — O24013 Pre-existing diabetes mellitus, type 1, in pregnancy, third trimester: Secondary | ICD-10-CM | POA: Diagnosis not present

## 2015-01-05 DIAGNOSIS — O09899 Supervision of other high risk pregnancies, unspecified trimester: Secondary | ICD-10-CM

## 2015-01-05 DIAGNOSIS — O403XX1 Polyhydramnios, third trimester, fetus 1: Secondary | ICD-10-CM

## 2015-01-05 DIAGNOSIS — O10013 Pre-existing essential hypertension complicating pregnancy, third trimester: Secondary | ICD-10-CM | POA: Diagnosis not present

## 2015-01-05 DIAGNOSIS — O283 Abnormal ultrasonic finding on antenatal screening of mother: Secondary | ICD-10-CM | POA: Diagnosis not present

## 2015-01-05 DIAGNOSIS — O403XX0 Polyhydramnios, third trimester, not applicable or unspecified: Secondary | ICD-10-CM | POA: Diagnosis not present

## 2015-01-05 DIAGNOSIS — O280 Abnormal hematological finding on antenatal screening of mother: Principal | ICD-10-CM

## 2015-01-09 ENCOUNTER — Encounter (HOSPITAL_COMMUNITY): Payer: Self-pay

## 2015-01-09 ENCOUNTER — Ambulatory Visit (HOSPITAL_COMMUNITY)
Admission: RE | Admit: 2015-01-09 | Discharge: 2015-01-09 | Disposition: A | Discharge status: Home / Self Care | Payer: 59 | Source: Ambulatory Visit | Attending: Obstetrics | Admitting: Obstetrics

## 2015-01-09 DIAGNOSIS — O24013 Pre-existing diabetes mellitus, type 1, in pregnancy, third trimester: Secondary | ICD-10-CM | POA: Diagnosis not present

## 2015-01-09 DIAGNOSIS — Z3A33 33 weeks gestation of pregnancy: Secondary | ICD-10-CM | POA: Diagnosis not present

## 2015-01-09 DIAGNOSIS — E109 Type 1 diabetes mellitus without complications: Secondary | ICD-10-CM | POA: Diagnosis not present

## 2015-01-09 DIAGNOSIS — O10913 Unspecified pre-existing hypertension complicating pregnancy, third trimester: Secondary | ICD-10-CM | POA: Insufficient documentation

## 2015-01-09 NOTE — ED Notes (Signed)
Pt states endocrinologist is following blood sugars.

## 2015-01-12 ENCOUNTER — Ambulatory Visit (HOSPITAL_COMMUNITY)
Admission: RE | Admit: 2015-01-12 | Discharge: 2015-01-12 | Disposition: A | Discharge status: Home / Self Care | Payer: 59 | Source: Ambulatory Visit | Attending: Obstetrics | Admitting: Obstetrics

## 2015-01-12 ENCOUNTER — Encounter (HOSPITAL_COMMUNITY): Payer: Self-pay

## 2015-01-12 VITALS — BP 131/85 | HR 98 | Wt 215.0 lb

## 2015-01-12 DIAGNOSIS — O280 Abnormal hematological finding on antenatal screening of mother: Secondary | ICD-10-CM

## 2015-01-12 DIAGNOSIS — O09899 Supervision of other high risk pregnancies, unspecified trimester: Secondary | ICD-10-CM

## 2015-01-12 DIAGNOSIS — O163 Unspecified maternal hypertension, third trimester: Secondary | ICD-10-CM

## 2015-01-12 DIAGNOSIS — O24011 Pre-existing diabetes mellitus, type 1, in pregnancy, first trimester: Secondary | ICD-10-CM

## 2015-01-12 DIAGNOSIS — O283 Abnormal ultrasonic finding on antenatal screening of mother: Secondary | ICD-10-CM | POA: Diagnosis not present

## 2015-01-12 IMAGING — US US MFM OB LIMITED
1 series · 13 of 15 positions shown · non-contrast
Comparison: none

[Series 1: us mfm ob limited · 0.23mm/px · 15 acquisitions, 13 frames shown]
[im 1/15]
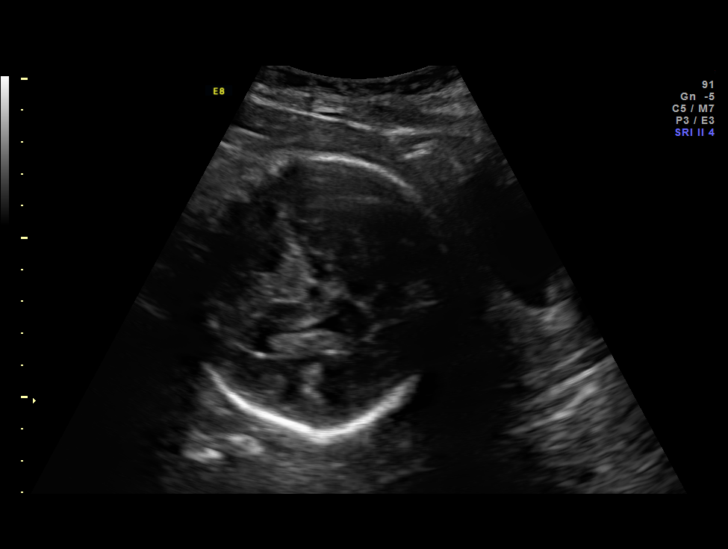
[im 2/15]
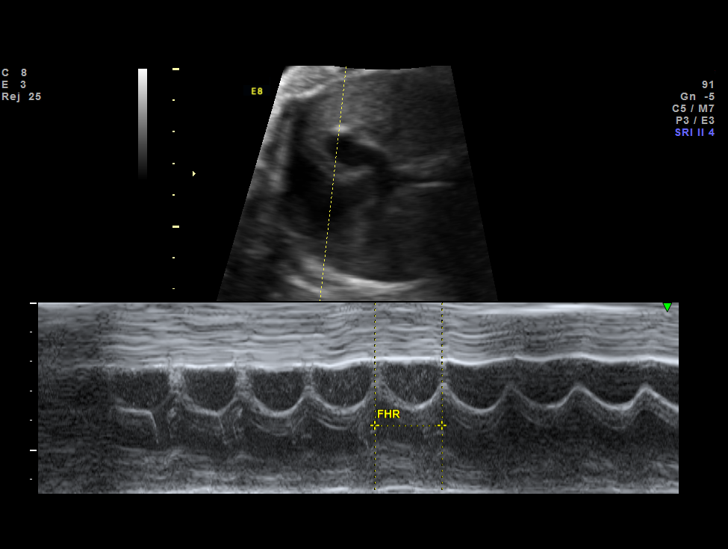
[im 3/15]
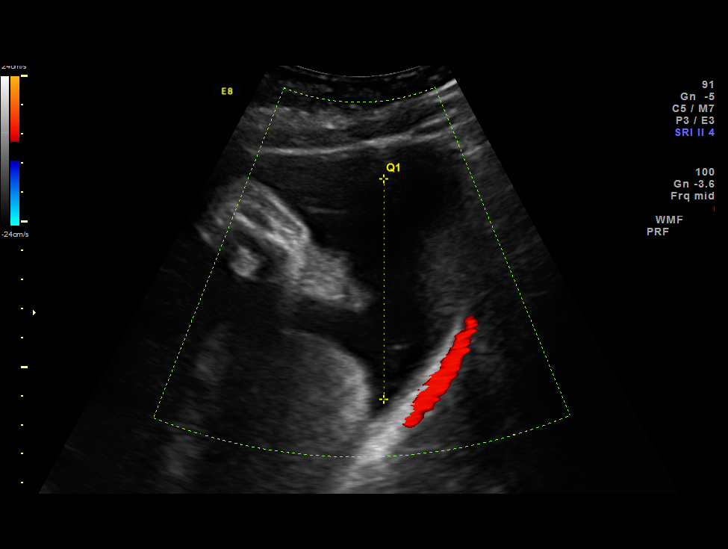
[im 5/15]
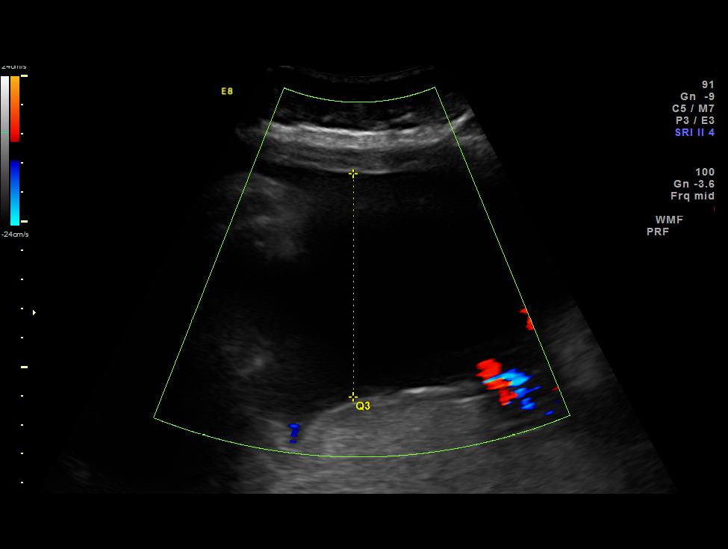
[im 6/15]
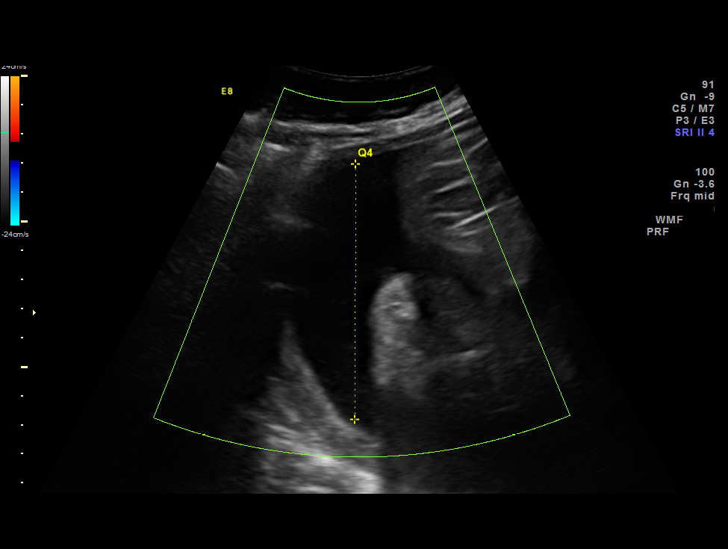
[im 7/15]
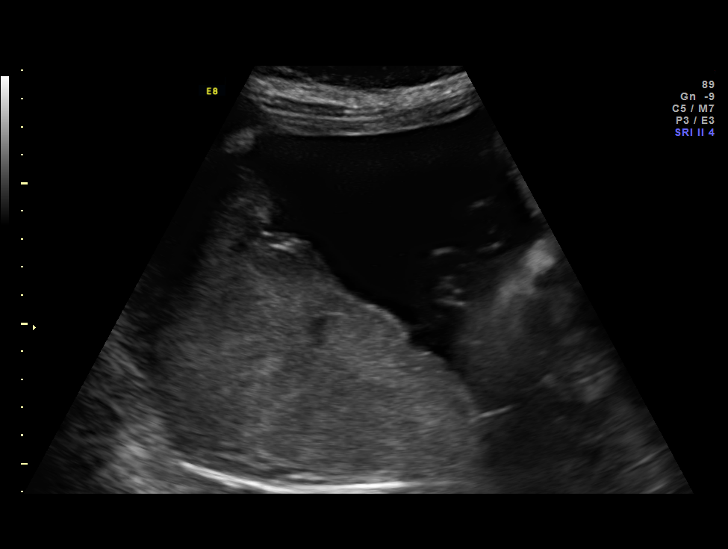
[im 8/15]
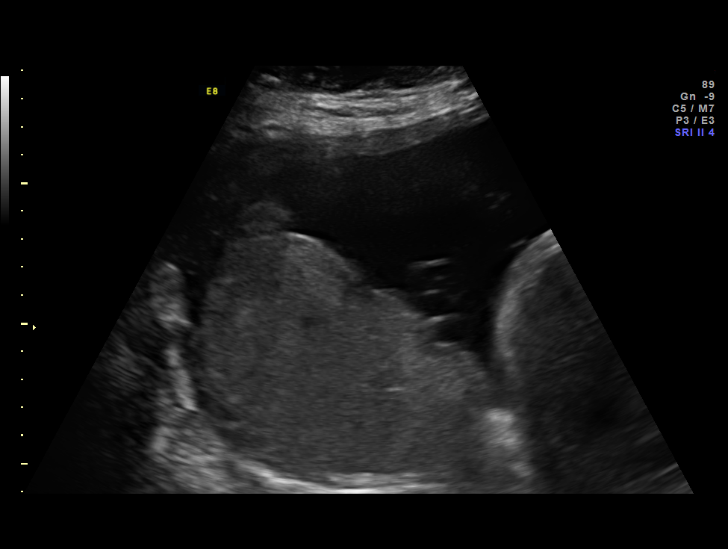
[im 9/15]
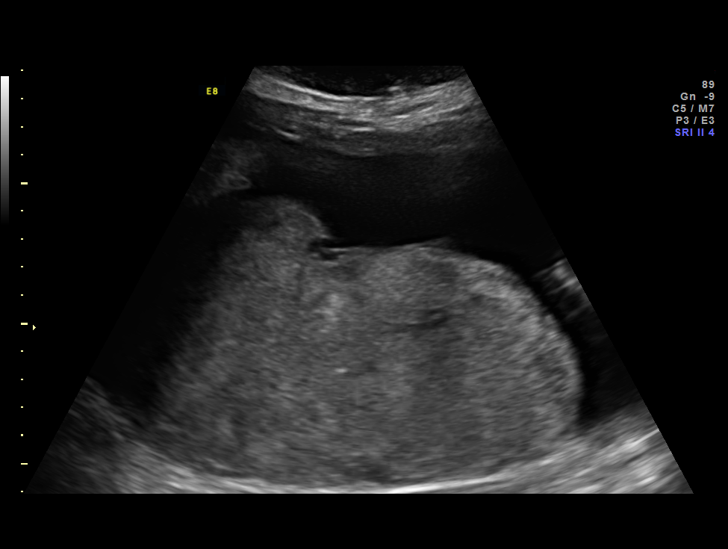
[im 10/15]
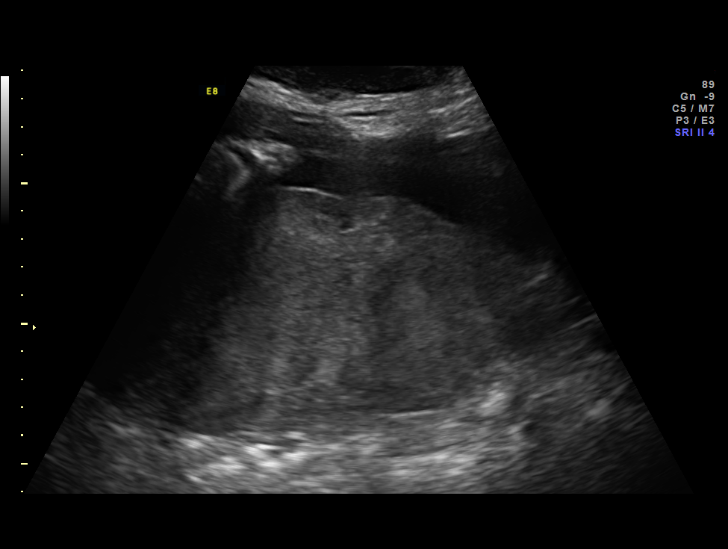
[im 11/15]
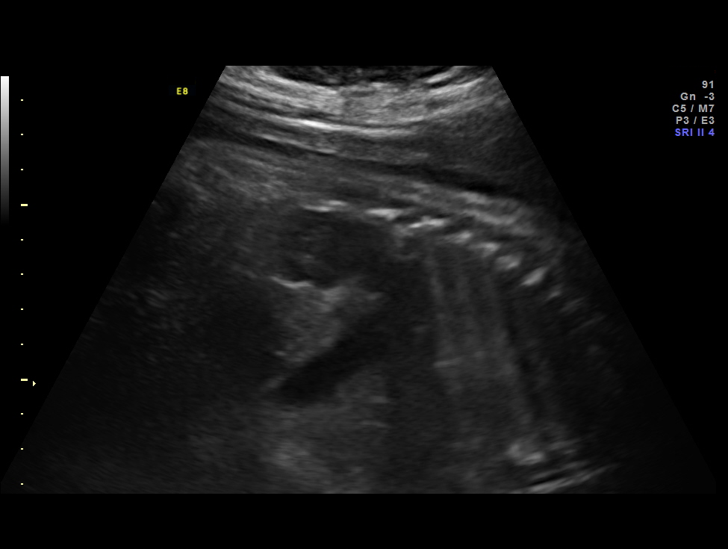
[im 13/15]
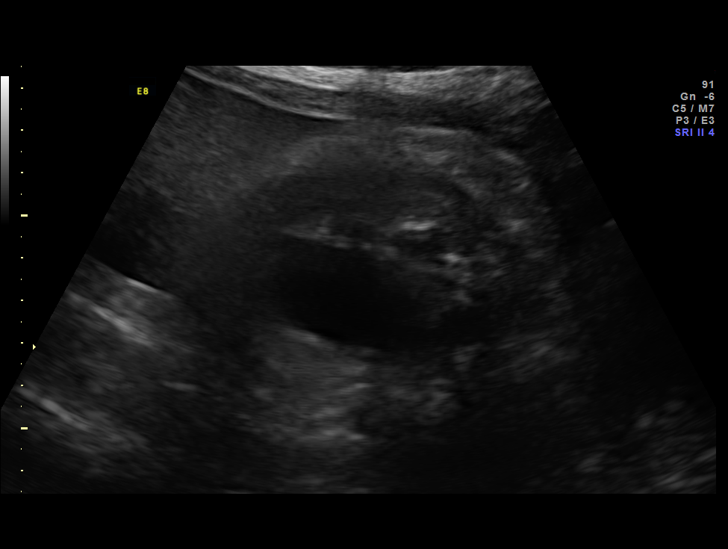
[im 14/15]
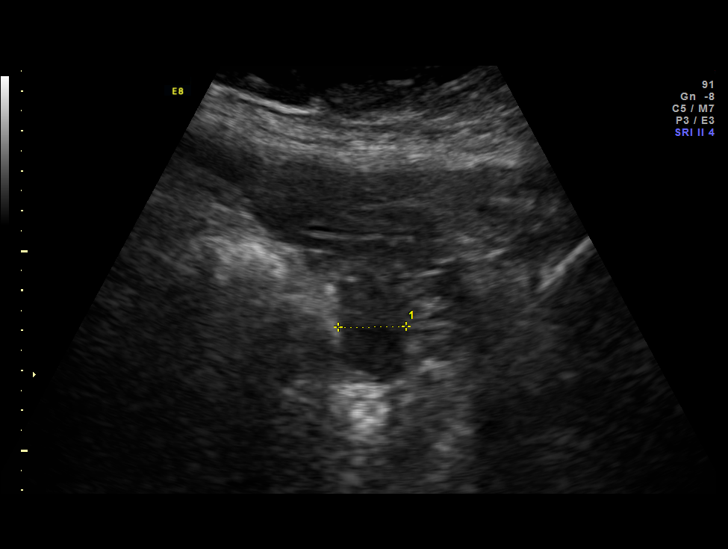
[im 15/15]
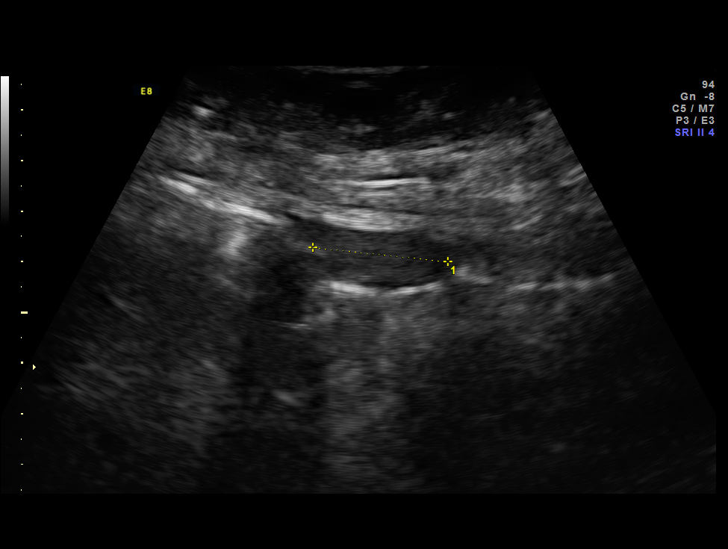

[13 of 15 positions shown; findings below may reference images not displayed]

OBSTETRICS REPORT
(Signed Final [DATE] [DATE])

Name:       MERRIAM                        Visit  [DATE] [DATE]
Date:

Service(s) Provided

US MFM OB LIMITED                                      76815.01
Indications

33 weeks gestation of pregnancy
Polyhydramnios, third trimester, antepartum            [E0]
condition or complication, unspecified fetus
H/O Pre-existing essential hypertension                [E0]
complicating pregnancy, third trimester
Pre-existing diabetes, type 1?, in pregnancy,          [E0]
third trimester - normal fetal ECHO; on insulin;
sees Dr. MERRIAM, endocrinologist
Abnormal first trimester screen, DSR [DATE], s/p        [E0]
MERRIAM; declined testing, MERRIAM
Fetal Evaluation

Num Of             1
Fetuses:
Fetal Heart        150                          bpm
Rate:
Cardiac Activity:  Observed
Presentation:      Cephalic
Placenta:          Posterior, above cervical
os
P. Cord            Previously Visualized
Insertion:

Amniotic Fluid
AFI FV:      Polyhydramnios
AFI Sum:     28.83    cm    > 97  %Tile     Larg Pckt:    8.79   cm
RUQ:   7.58    cm    RLQ:   8.79    cm   LUQ:    4.77    cm   LLQ:    7.69   cm
Gestational Age

LMP:           33w 0d        Date:  [DATE]                  EDD:   [DATE]
Best:          33w 0d    Det. By:   LMP  ([DATE])           EDD:   [DATE]
Cervix Uterus Adnexa
Cervix:       Not visualized (advanced GA >[E0])

Left Ovary:    Within normal limits.
Right Ovary:   Within normal limits.

Adnexa:     No abnormality visualized.
Impression

Single IUP at 33w 0d
Pre-existing diabetes, polyhydramnios, CHTN, abnormal
quad screen
Limited ultrasound performed for amniotic fluid volume
assessment
Reactive NST
Mild polyhydramnios noted (AFI 28.8 cm)
Normal modified BPP
Recommendations

Continue antenatal testing as scheduled.
Delivery by 39 weeks in the absence of other complications.

## 2015-01-13 ENCOUNTER — Inpatient Hospital Stay (EMERGENCY_DEPARTMENT_HOSPITAL)
Admission: AD | Admit: 2015-01-13 | Discharge: 2015-01-13 | Disposition: A | Discharge status: Home / Self Care | Payer: 59 | Source: Ambulatory Visit | Attending: Obstetrics | Admitting: Obstetrics

## 2015-01-13 ENCOUNTER — Encounter (HOSPITAL_COMMUNITY): Payer: Self-pay | Admitting: *Deleted

## 2015-01-13 DIAGNOSIS — O4703 False labor before 37 completed weeks of gestation, third trimester: Secondary | ICD-10-CM

## 2015-01-13 DIAGNOSIS — O134 Gestational [pregnancy-induced] hypertension without significant proteinuria, complicating childbirth: Secondary | ICD-10-CM | POA: Diagnosis not present

## 2015-01-13 LAB — URINALYSIS, ROUTINE W REFLEX MICROSCOPIC
Bilirubin Urine: NEGATIVE
Glucose, UA: >1000 mg/dL — AB
Hgb urine dipstick: NEGATIVE
Ketones, ur: >80 mg/dL — AB
Leukocytes, UA: NEGATIVE
Nitrite: NEGATIVE
Protein, ur: NEGATIVE mg/dL
Specific Gravity, Urine: 1.015 (ref 1.005–1.030)
Urobilinogen, UA: 0.2 mg/dL (ref 0.0–1.0)
pH: 6 (ref 5.0–8.0)

## 2015-01-13 LAB — FETAL FIBRONECTIN: Fetal Fibronectin: NEGATIVE

## 2015-01-13 LAB — URINE MICROSCOPIC-ADD ON

## 2015-01-13 MED ORDER — NIFEDIPINE 10 MG PO CAPS: 10.0000 mg | ORAL_CAPSULE | Freq: Four times a day (QID) | ORAL | Status: DC | PRN

## 2015-01-13 NOTE — MAU Note (Addendum)
PT SAYS  SHE STARTED FEELING  UC  AT 10PM.    NO VE.

## 2015-01-13 NOTE — MAU Provider Note (Signed)
History     CSN: [id number]  Arrival date and time: 01/13/15 0211   First Provider Initiated Contact with Patient 01/13/15 0302      Chief Complaint  Patient presents with  . Contractions   HPI This is a 24 y.o. female at 35w1dwho presents with c/o contractions.  States They started about 2200hrs. Denies leaking or bleeding. Denies history of preterm contractions. Has not had IC or vag exam recently   RN Note:  Contractions since 2200. Denies LOF or bleeding. No sex in last 24hrs        OB History    Gravida Para Term Preterm AB TAB SAB Ectopic Multiple Living   1               Past Medical History  Diagnosis Date  . Diabetes mellitus   . Hypertension   . [place]    Since age 24 . Gastroparesis   . Anxiety   . Infection     UTI April 2016  . Fibromyalgia     Past Surgical History  Procedure Laterality Date  . Ankle surgery    . Cholecystectomy  11/15/2011    Procedure: LAPAROSCOPIC CHOLECYSTECTOMY WITH INTRAOPERATIVE CHOLANGIOGRAM;  Surgeon: SAdin Hector MD;  Location: [facility];  Service: General;  Laterality: N/A;  . Laparoscopy  11/23/2011    Procedure: LAPAROSCOPY DIAGNOSTIC;  Surgeon: BEdward Jolly MD;  Location: [facility];  Service: General;  Laterality: N/A;  . Esophagogastroduodenoscopy  12/03/2011    Procedure: ESOPHAGOGASTRODUODENOSCOPY (EGD);  Surgeon: PBeryle Beams MD;  [facility];  Service: Endoscopy;  Laterality: N/A;    Family History  Problem Relation Age of Onset  . Diabetes Mother   . Hypertension Father     Social History  Substance Use Topics  . Smoking status: Never Smoker   . Smokeless tobacco: Never Used  . Alcohol Use: No     Comment: Occasional, once a month mixed drink- when not pregnant    Allergies:  Allergies  Allergen Reactions  . Peanut-Containing Drug Products Anaphylaxis and Other (See Comments)    Pt states that she is allergic to bBolivianuts.         Prescriptions prior to admission  Medication Sig Dispense Refill Last Dose  . acetaminophen (TYLENOL) 500 MG tablet Take 1,000 mg by mouth every 6 (six) hours as needed for mild pain or headache.    Past Week at Unknown time  . aspirin 81 MG chewable tablet Chew 1 tablet (81 mg total) by mouth daily. 30 tablet 12 01/12/2015 at Unknown time  . butalbital-acetaminophen-caffeine (FIORICET) 50-325-40 MG per tablet Take 1-2 tablets by mouth every 6 (six) hours as needed for headache. 20 tablet 0 Past Month at Unknown time  . Cholecalciferol (VITAMIN D PO) Take 2 each by mouth daily.    01/12/2015 at Unknown time  . doxylamine, Sleep, (UNISOM) 25 MG tablet Take 25-50 mg by mouth at bedtime as needed for sleep.   Past Week at Unknown time  . Doxylamine-Pyridoxine 10-10 MG TBEC Start with 2 tablets every evening. If symptoms persist, add 1 tablet every morning. If symptoms persist, add 1 tablet at mid-day. (Patient taking differently: Take 2 tablets by mouth at bedtime. ) 60 tablet 3 Past Week at Unknown time  . insulin detemir (LEVEMIR) 100 UNIT/ML injection Inject 14 Units into the skin 2 (two) times daily.   01/12/2015 at Unknown time  . insulin lispro (HUMALOG)  100 UNIT/ML injection Inject 2-12 Units into the skin 3 (three) times daily as needed for high blood sugar. Pt uses as needed per sliding scale.   01/12/2015 at Unknown time  . Prenat w/o A-FeCbn-Meth-FA-DHA (OB COMPLETE GOLD) 27.5-1-200 MG CAPS Take 1 tablet by mouth daily. (Patient taking differently: Take 1 capsule by mouth daily. ) 30 capsule 12 01/12/2015 at Unknown time  . promethazine (PHENERGAN) 25 MG tablet Take 1 tablet (25 mg total) by mouth every 6 (six) hours as needed for nausea or vomiting. 30 tablet 0 01/12/2015 at Unknown time  . ranitidine (ZANTAC) 150 MG capsule Take 1 capsule (150 mg total) by mouth 2 (two) times daily. 60 capsule 5 01/12/2015 at Unknown time  . EPINEPHrine (EPIPEN 2-PAK) 0.3 mg/0.3 mL IJ SOAJ injection  Inject 0.3 mLs (0.3 mg total) into the muscle once. (Patient taking differently: Inject 0.3 mg into the muscle once as needed (for severe allergic reaction). ) 1 Device 1 More than a month at Unknown time  . glucose 4 GM chewable tablet Chew 1 tablet (4 g total) by mouth as needed for low blood sugar. 50 tablet 12 More than a month at Unknown time  . ketotifen (ZADITOR) 0.025 % ophthalmic solution Place 1 drop into both eyes 2 (two) times daily as needed (dry eyes).   More than a month at Unknown time   Medical, Surgical, Family and Social histories reviewed and are listed above.  Medications and allergies reviewed.   Review of Systems  Constitutional: Negative for fever, chills and malaise/fatigue.  Gastrointestinal: Positive for abdominal pain. Negative for nausea, vomiting, diarrhea and constipation.  Genitourinary: Negative for dysuria.  Musculoskeletal: Negative for back pain.  Other systems negative  Physical Exam   Blood pressure 117/83, pulse 115, temperature 97.7 F (36.5 C), resp. rate 18, height 5' 7"  (1.702 m), weight 97.251 kg (214 lb 6.4 oz), last menstrual period 05/26/2014.  Physical Exam  Constitutional: She is oriented to person, place, and time. She appears well-developed and well-nourished. No distress.  HENT:  Head: Normocephalic.  Cardiovascular: Normal rate and regular rhythm.   Respiratory: Effort normal. No respiratory distress.  GI: Soft. She exhibits no distension. There is no tenderness. There is no rebound and no guarding.  FHR reactive 2 contractions in 20 minutes. Will continue to monitor  Genitourinary: Vagina normal. No vaginal discharge found.  EGBUS normal Cervix is effaced Dilation: Closed Effacement (%): 70 Station: -3 Exam by:: Carmelia Roller, CNM   Musculoskeletal: Normal range of motion.  Neurological: She is alert and oriented to person, place, and time.  Skin: Skin is warm and dry.  Psychiatric: She has a normal mood and affect.    MAU  Course  Procedures  MDM WIll send Fetal Fibronectin for r/o PTL WIll monitor for contractions If they develop, will give Procardia  >>  Only had 2 contractions in 40 minutes FFN came back negative Results for orders placed or performed during the hospital encounter of 01/13/15 (from the past 24 hour(s))  Urinalysis, Routine w reflex microscopic (not at [facility])     Status: Abnormal   Collection Time: 01/13/15  2:23 AM  Result Value Ref Range   Color, Urine YELLOW YELLOW   APPearance CLEAR CLEAR   Specific Gravity, Urine 1.015 1.005 - 1.030   pH 6.0 5.0 - 8.0   Glucose, UA >1000 (A) NEGATIVE mg/dL   Hgb urine dipstick NEGATIVE NEGATIVE   Bilirubin Urine NEGATIVE NEGATIVE   Ketones, ur >80 (A) NEGATIVE  mg/dL   Protein, ur NEGATIVE NEGATIVE mg/dL   Urobilinogen, UA 0.2 0.0 - 1.0 mg/dL   Nitrite NEGATIVE NEGATIVE   Leukocytes, UA NEGATIVE NEGATIVE  Urine microscopic-add on     Status: Abnormal   Collection Time: 01/13/15  2:23 AM  Result Value Ref Range   Squamous Epithelial / LPF FEW (A) RARE   WBC, UA 0-2 <3 WBC/hpf   RBC / HPF 0-2 <3 RBC/hpf   Bacteria, UA FEW (A) RARE  Fetal fibronectin     Status: None   Collection Time: 01/13/15  3:05 AM  Result Value Ref Range   Fetal Fibronectin NEGATIVE NEGATIVE     Assessment and Plan  A:  SIUP at [redacted]w[redacted]d       Preterm contractions       No change in cervix       Negative fetal fibronectin    P:  Discharge home       Rx Procardia for PRN home use if she has more than 5 UCs per hour       Followup with Dr hJodi Mourningnext week  [facility]10/15/2016, 3:07 AM

## 2015-01-13 NOTE — MAU Note (Signed)
Contractions since 2200. Denies LOF or bleeding. No sex in last 24hrs

## 2015-01-13 NOTE — Discharge Instructions (Signed)
Pelvic Rest °Pelvic rest is sometimes recommended for women when:  °· The placenta is partially or completely covering the opening of the cervix (placenta previa). °· There is bleeding between the uterine wall and the amniotic sac in the first trimester (subchorionic hemorrhage). °· The cervix begins to open without labor starting (incompetent cervix, cervical insufficiency). °· The labor is too early (preterm labor). °HOME CARE INSTRUCTIONS °· Do not have sexual intercourse, stimulation, or an orgasm. °· Do not use tampons, douche, or put anything in the vagina. °· Do not lift anything over 10 pounds (4.5 kg). °· Avoid strenuous activity or straining your pelvic muscles. °SEEK MEDICAL CARE IF:  °· You have any vaginal bleeding during pregnancy. Treat this as a potential emergency. °· You have cramping pain felt low in the stomach (stronger than menstrual cramps). °· You notice vaginal discharge (watery, mucus, or bloody). °· You have a low, dull backache. °· There are regular contractions or uterine tightening. °SEEK IMMEDIATE MEDICAL CARE IF: °You have vaginal bleeding and have placenta previa.  °  °This information is not intended to replace advice given to you by your health care provider. Make sure you discuss any questions you have with your health care provider. °  °Document Released: 07/12/2010 Document Revised: 06/09/2011 Document Reviewed: 09/18/2014 °Elsevier Interactive Patient Education ©2016 [employer] °Preterm Labor Information °Preterm labor is when labor starts at less than 37 weeks of pregnancy. The normal length of a pregnancy is 39 to 41 weeks. °CAUSES °Often, there is no identifiable underlying cause as to why a woman goes into preterm labor. One of the most common known causes of preterm labor is infection. Infections of the uterus, cervix, vagina, amniotic sac, bladder, kidney, or even the lungs (pneumonia) can cause labor to start. Other suspected causes of preterm labor include:   °· Urogenital infections, such as yeast infections and bacterial vaginosis.   °· Uterine abnormalities (uterine shape, uterine septum, fibroids, or bleeding from the placenta).   °· A cervix that has been operated on (it may fail to stay closed).   °· Malformations in the fetus.   °· Multiple gestations (twins, triplets, and so on).   °· Breakage of the amniotic sac.   °RISK FACTORS °· Having a previous history of preterm labor.   °· Having premature rupture of membranes (PROM).   °· Having a placenta that covers the opening of the cervix (placenta previa).   °· Having a placenta that separates from the uterus (placental abruption).   °· Having a cervix that is too weak to hold the fetus in the uterus (incompetent cervix).   °· Having too much fluid in the amniotic sac (polyhydramnios).   °· Taking illegal drugs or smoking while pregnant.   °· Not gaining enough weight while pregnant.   °· Being younger than 18 and older than 25 years old.   °· Having a low socioeconomic status.   °· Being African American. °SYMPTOMS °Signs and symptoms of preterm labor include:  °· Menstrual-like cramps, abdominal pain, or back pain. °· Uterine contractions that are regular, as frequent as six in an hour, regardless of their intensity (may be mild or painful). °· Contractions that start on the top of the uterus and spread down to the lower abdomen and back.   °· A sense of increased pelvic pressure.   °· A watery or bloody mucus discharge that comes from the vagina.   °TREATMENT °Depending on the length of the pregnancy and other circumstances, your health care provider may suggest bed rest. If necessary, there are medicines that can be given to stop contractions   and to mature the fetal lungs. If labor happens before 34 weeks of pregnancy, a prolonged hospital stay may be recommended. Treatment depends on the condition of both you and the fetus.  °WHAT SHOULD YOU DO IF YOU THINK YOU ARE IN PRETERM LABOR? °Call your health care  provider right away. You will need to go to the hospital to get checked immediately. °HOW CAN YOU PREVENT PRETERM LABOR IN FUTURE PREGNANCIES? °You should:  °· Stop smoking if you smoke.  °· Maintain healthy weight gain and avoid chemicals and drugs that are not necessary. °· Be watchful for any type of infection. °· Inform your health care provider if you have a known history of preterm labor. °  °This information is not intended to replace advice given to you by your health care provider. Make sure you discuss any questions you have with your health care provider. °  °Document Released: 06/07/2003 Document Revised: 11/17/2012 Document Reviewed: 04/19/2012 °Elsevier Interactive Patient Education ©2016 Elsevier Inc. ° °

## 2015-01-16 ENCOUNTER — Ambulatory Visit (HOSPITAL_COMMUNITY)
Admission: RE | Admit: 2015-01-16 | Discharge: 2015-01-16 | Disposition: A | Discharge status: Home / Self Care | Payer: 59 | Source: Ambulatory Visit | Attending: Obstetrics | Admitting: Obstetrics

## 2015-01-16 ENCOUNTER — Encounter (HOSPITAL_COMMUNITY): Payer: Self-pay | Admitting: *Deleted

## 2015-01-16 ENCOUNTER — Inpatient Hospital Stay (HOSPITAL_COMMUNITY)
Admission: AD | Admit: 2015-01-16 | Discharge: 2015-01-29 | DRG: 774 | Disposition: A | Discharge status: Home / Self Care | Payer: 59 | Source: Ambulatory Visit | Attending: Obstetrics | Admitting: Obstetrics

## 2015-01-16 ENCOUNTER — Encounter (HOSPITAL_COMMUNITY): Payer: Self-pay

## 2015-01-16 DIAGNOSIS — Z3A34 34 weeks gestation of pregnancy: Secondary | ICD-10-CM

## 2015-01-16 DIAGNOSIS — O403XX0 Polyhydramnios, third trimester, not applicable or unspecified: Secondary | ICD-10-CM | POA: Diagnosis present

## 2015-01-16 DIAGNOSIS — O10013 Pre-existing essential hypertension complicating pregnancy, third trimester: Secondary | ICD-10-CM

## 2015-01-16 DIAGNOSIS — O99354 Diseases of the nervous system complicating childbirth: Secondary | ICD-10-CM | POA: Diagnosis present

## 2015-01-16 DIAGNOSIS — O2690 Pregnancy related conditions, unspecified, unspecified trimester: Secondary | ICD-10-CM | POA: Diagnosis present

## 2015-01-16 DIAGNOSIS — O280 Abnormal hematological finding on antenatal screening of mother: Secondary | ICD-10-CM

## 2015-01-16 DIAGNOSIS — O1092 Unspecified pre-existing hypertension complicating childbirth: Secondary | ICD-10-CM | POA: Diagnosis present

## 2015-01-16 DIAGNOSIS — O169 Unspecified maternal hypertension, unspecified trimester: Secondary | ICD-10-CM | POA: Diagnosis present

## 2015-01-16 DIAGNOSIS — O99214 Obesity complicating childbirth: Secondary | ICD-10-CM | POA: Diagnosis present

## 2015-01-16 DIAGNOSIS — O24424 Gestational diabetes mellitus in childbirth, insulin controlled: Secondary | ICD-10-CM | POA: Diagnosis present

## 2015-01-16 DIAGNOSIS — R000 Tachycardia, unspecified: Secondary | ICD-10-CM | POA: Diagnosis present

## 2015-01-16 DIAGNOSIS — K219 Gastro-esophageal reflux disease without esophagitis: Secondary | ICD-10-CM | POA: Diagnosis present

## 2015-01-16 DIAGNOSIS — O134 Gestational [pregnancy-induced] hypertension without significant proteinuria, complicating childbirth: Principal | ICD-10-CM | POA: Diagnosis present

## 2015-01-16 DIAGNOSIS — O99344 Other mental disorders complicating childbirth: Secondary | ICD-10-CM | POA: Diagnosis present

## 2015-01-16 DIAGNOSIS — O24013 Pre-existing diabetes mellitus, type 1, in pregnancy, third trimester: Secondary | ICD-10-CM

## 2015-01-16 DIAGNOSIS — O4290 Premature rupture of membranes, unspecified as to length of time between rupture and onset of labor, unspecified weeks of gestation: Secondary | ICD-10-CM

## 2015-01-16 DIAGNOSIS — F419 Anxiety disorder, unspecified: Secondary | ICD-10-CM | POA: Diagnosis present

## 2015-01-16 DIAGNOSIS — O289 Unspecified abnormal findings on antenatal screening of mother: Secondary | ICD-10-CM

## 2015-01-16 DIAGNOSIS — Z6833 Body mass index (BMI) 33.0-33.9, adult: Secondary | ICD-10-CM | POA: Diagnosis not present

## 2015-01-16 DIAGNOSIS — Z3490 Encounter for supervision of normal pregnancy, unspecified, unspecified trimester: Secondary | ICD-10-CM

## 2015-01-16 DIAGNOSIS — G709 Myoneural disorder, unspecified: Secondary | ICD-10-CM | POA: Diagnosis present

## 2015-01-16 DIAGNOSIS — O09899 Supervision of other high risk pregnancies, unspecified trimester: Secondary | ICD-10-CM

## 2015-01-16 DIAGNOSIS — M797 Fibromyalgia: Secondary | ICD-10-CM | POA: Diagnosis present

## 2015-01-16 DIAGNOSIS — E669 Obesity, unspecified: Secondary | ICD-10-CM | POA: Diagnosis present

## 2015-01-16 DIAGNOSIS — Z3689 Encounter for other specified antenatal screening: Secondary | ICD-10-CM

## 2015-01-16 LAB — CBC
HCT: 32.3 % — ABNORMAL LOW (ref 36.0–46.0)
Hemoglobin: 11.1 g/dL — ABNORMAL LOW (ref 12.0–15.0)
MCH: 30.6 pg (ref 26.0–34.0)
MCHC: 34.4 g/dL (ref 30.0–36.0)
MCV: 89 fL (ref 78.0–100.0)
Platelets: 182 10*3/uL (ref 150–400)
RBC: 3.63 MIL/uL — ABNORMAL LOW (ref 3.87–5.11)
RDW: 12.6 % (ref 11.5–15.5)
WBC: 4.7 10*3/uL (ref 4.0–10.5)

## 2015-01-16 LAB — COMPREHENSIVE METABOLIC PANEL
ALT: 14 U/L (ref 14–54)
AST: 19 U/L (ref 15–41)
Albumin: 2.5 g/dL — ABNORMAL LOW (ref 3.5–5.0)
Alkaline Phosphatase: 89 U/L (ref 38–126)
Anion gap: 3 — ABNORMAL LOW (ref 5–15)
BUN: 13 mg/dL (ref 6–20)
CO2: 23 mmol/L (ref 22–32)
Calcium: 9.2 mg/dL (ref 8.9–10.3)
Chloride: 106 mmol/L (ref 101–111)
Creatinine, Ser: 0.53 mg/dL (ref 0.44–1.00)
GFR calc Af Amer: >60 mL/min (ref >60)
GFR calc non Af Amer: >60 mL/min (ref >60)
Glucose, Bld: 313 mg/dL — ABNORMAL HIGH (ref 65–99)
Potassium: 4.4 mmol/L (ref 3.5–5.1)
Sodium: 132 mmol/L — ABNORMAL LOW (ref 135–145)
Total Bilirubin: 0.6 mg/dL (ref 0.3–1.2)
Total Protein: 6.7 g/dL (ref 6.5–8.1)

## 2015-01-16 LAB — PROTEIN / CREATININE RATIO, URINE
Creatinine, Urine: 80 mg/dL
Protein Creatinine Ratio: 0.16 mg/mg{Cre} — ABNORMAL HIGH (ref 0.00–0.15)
Total Protein, Urine: 13 mg/dL

## 2015-01-16 LAB — GLUCOSE, CAPILLARY: Glucose-Capillary: 269 mg/dL — ABNORMAL HIGH (ref 65–99)

## 2015-01-16 MED ORDER — LABETALOL HCL 5 MG/ML IV SOLN
20.0000 mg | INTRAVENOUS | Status: AC | PRN
  Administered 2015-01-16 – 2015-01-22 (×3): 20 mg via INTRAVENOUS
  Filled 2015-01-16 (×3): qty 4

## 2015-01-16 MED ORDER — ACETAMINOPHEN 325 MG PO TABS: 650.0000 mg | ORAL_TABLET | ORAL | Status: DC | PRN

## 2015-01-16 MED ORDER — DOXYLAMINE SUCCINATE (SLEEP) 25 MG PO TABS
50.0000 mg | ORAL_TABLET | Freq: Every evening | ORAL | Status: DC | PRN
  Filled 2015-01-16: qty 2

## 2015-01-16 MED ORDER — MAGNESIUM SULFATE BOLUS VIA INFUSION
4.0000 g | Freq: Once | INTRAVENOUS | Status: AC
  Administered 2015-01-16: 4 g via INTRAVENOUS
  Filled 2015-01-16: qty 500

## 2015-01-16 MED ORDER — LACTATED RINGERS IV SOLN
INTRAVENOUS | Status: DC
  Administered 2015-01-17 – 2015-01-24 (×16): via INTRAVENOUS

## 2015-01-16 MED ORDER — ASPIRIN 81 MG PO CHEW
81.0000 mg | CHEWABLE_TABLET | Freq: Every day | ORAL | Status: DC
  Administered 2015-01-16 – 2015-01-24 (×9): 81 mg via ORAL
  Filled 2015-01-16 (×10): qty 1

## 2015-01-16 MED ORDER — CALCIUM CARBONATE ANTACID 500 MG PO CHEW: 2.0000 | CHEWABLE_TABLET | ORAL | Status: DC | PRN

## 2015-01-16 MED ORDER — BETAMETHASONE SOD PHOS & ACET 6 (3-3) MG/ML IJ SUSP
12.0000 mg | INTRAMUSCULAR | Status: AC
  Administered 2015-01-16 – 2015-01-17 (×2): 12 mg via INTRAMUSCULAR
  Filled 2015-01-16 (×2): qty 2

## 2015-01-16 MED ORDER — PRENATAL MULTIVITAMIN CH
1.0000 | ORAL_TABLET | Freq: Every day | ORAL | Status: DC
  Administered 2015-01-17 – 2015-01-23 (×7): 1 via ORAL
  Filled 2015-01-16 (×7): qty 1

## 2015-01-16 MED ORDER — GLUCOSE 4 G PO CHEW: 1.0000 | CHEWABLE_TABLET | ORAL | Status: DC | PRN

## 2015-01-16 MED ORDER — INSULIN DETEMIR 100 UNIT/ML ~~LOC~~ SOLN
14.0000 [IU] | Freq: Two times a day (BID) | SUBCUTANEOUS | Status: DC
Start: 2015-01-16 — End: 2015-01-22
  Administered 2015-01-16 – 2015-01-21 (×9): 14 [IU] via SUBCUTANEOUS
  Filled 2015-01-16 (×12): qty 0.14

## 2015-01-16 MED ORDER — HYDRALAZINE HCL 20 MG/ML IJ SOLN
10.0000 mg | Freq: Once | INTRAMUSCULAR | Status: AC | PRN
  Administered 2015-01-22: 10 mg via INTRAVENOUS
  Filled 2015-01-16: qty 1

## 2015-01-16 MED ORDER — LABETALOL HCL 100 MG PO TABS: 200.0000 mg | ORAL_TABLET | Freq: Three times a day (TID) | ORAL | Status: DC

## 2015-01-16 MED ORDER — ACETAMINOPHEN 500 MG PO TABS: 1000.0000 mg | ORAL_TABLET | Freq: Four times a day (QID) | ORAL | Status: DC | PRN

## 2015-01-16 MED ORDER — INSULIN ASPART 100 UNIT/ML ~~LOC~~ SOLN
0.0000 [IU] | Freq: Three times a day (TID) | SUBCUTANEOUS | Status: DC
  Administered 2015-01-16: 8 [IU] via SUBCUTANEOUS
  Administered 2015-01-18: 4 [IU] via SUBCUTANEOUS

## 2015-01-16 MED ORDER — LABETALOL HCL 100 MG PO TABS
200.0000 mg | ORAL_TABLET | Freq: Three times a day (TID) | ORAL | Status: DC
  Administered 2015-01-16 – 2015-01-22 (×18): 200 mg via ORAL
  Filled 2015-01-16 (×18): qty 2

## 2015-01-16 MED ORDER — DOXYLAMINE-PYRIDOXINE 10-10 MG PO TBEC: 2.0000 | DELAYED_RELEASE_TABLET | Freq: Every day | ORAL | Status: DC

## 2015-01-16 MED ORDER — MAGNESIUM SULFATE 50 % IJ SOLN
2.0000 g/h | INTRAVENOUS | Status: DC
  Administered 2015-01-17: 2 g/h via INTRAVENOUS
  Filled 2015-01-16 (×2): qty 80

## 2015-01-16 NOTE — H&P (Signed)
[name] is a 24 y.o. female presenting for elevated BP. Maternal Medical History:  Prenatal Complications - Diabetes: type 1. Diabetes is managed by insulin injections.      OB History    Gravida Para Term Preterm AB TAB SAB Ectopic Multiple Living   1              Past Medical History  Diagnosis Date  . Hypertension   . Gastroparesis   . Anxiety   . Infection     UTI April 2016  . Fibromyalgia   . Diabetes mellitus   . Diabetes type 1, uncontrolled (HCC) 11/14/2011    Since age 79   Past Surgical History  Procedure Laterality Date  . Ankle surgery    . Cholecystectomy  11/15/2011    Procedure: LAPAROSCOPIC CHOLECYSTECTOMY WITH INTRAOPERATIVE CHOLANGIOGRAM;  Surgeon: [name], MD;  Location: [facility];  Service: General;  Laterality: N/A;  . Laparoscopy  11/23/2011    Procedure: LAPAROSCOPY DIAGNOSTIC;  Surgeon: Mariella Saa, MD;  Location: [facility];  Service: General;  Laterality: N/A;  . Esophagogastroduodenoscopy  12/03/2011    Procedure: ESOPHAGOGASTRODUODENOSCOPY (EGD);  Surgeon: Theda Belfast, MD;  Location[facility];  Service: Endoscopy;  Laterality: N/A;   Family History: family history includes Diabetes in her mother; Hypertension in her father. Social History:  reports that she has never smoked. She has never used smokeless tobacco. She reports that she does not drink alcohol or use illicit drugs.   Prenatal Transfer Tool  Maternal Diabetes: Yes:  Diabetes Type:  Pre-pregnancy, Insulin/Medication controlled Genetic Screening: Normal Maternal Ultrasounds/Referrals: Normal Fetal Ultrasounds or other Referrals:  Referred to Materal Fetal Medicine  Maternal Substance Abuse:  No Significant Maternal Medications:  Meds include: Other: Insulin Significant Maternal Lab Results:  None Other Comments:  None  Review of Systems  All other systems reviewed and are negative.     Blood pressure 111/75, pulse 100, temperature 98 F (36.7 C), temperature  source Oral, resp. rate 18, weight 215 lb 6.4 oz (97.705 kg), last menstrual period 05/26/2014. Maternal Exam:  Abdomen: Patient reports no abdominal tenderness.   Physical Exam  Nursing note and vitals reviewed. Constitutional: She is oriented to person, place, and time. She appears well-developed and well-nourished.  HENT:  Head: Normocephalic and atraumatic.  Eyes: Conjunctivae are normal. Pupils are equal, round, and reactive to light.  Neck: Normal range of motion. Neck supple.  Cardiovascular: Normal rate and regular rhythm.   Respiratory: Effort normal and breath sounds normal.  GI: Soft.  Musculoskeletal: Normal range of motion.  Neurological: She is alert and oriented to person, place, and time.  Skin: Skin is warm and dry.  Psychiatric: She has a normal mood and affect. Her behavior is normal. Judgment and thought content normal.    Prenatal labs: ABO, Rh: O/POS/-- (04/06 1147) Antibody: NEG (04/06 1147) Rubella: 0.87 (04/06 1147) RPR: NON REAC (04/06 1147)  HBsAg: NEGATIVE (04/06 1147)  HIV: NONREACTIVE (04/06 1147)  GBS:     Assessment/Plan: 33 weeks.  Elevated BP.  Type 1 Diabetes, uncontrolled.  Admit.   Adreanne Yono A 01/16/2015, 7:57 PM

## 2015-01-16 NOTE — MAU Note (Signed)
Was at MFM, due to GDM, BP was up today. Denies HA, visual changes, epigastric pain or changes in swelling.  No pain, no bleeding or leaking

## 2015-01-16 NOTE — MAU Provider Note (Signed)
History   [id number]   Chief Complaint  Patient presents with  . Hypertension    HPI [name] is a 24 y.o. female  G1P0 at [redacted]w[redacted]d IUP sent over from MFM office for elevated blood pressures.  In the office patient blood pressures reported as 170-180/100's.  Pt denies  headache, vision changes, or epigastric pain.  Denies vaginal bleeding, leaking of fluid.  +occasional contractions.  +fetal movement.   Patient's last menstrual period was 05/26/2014.  OB History  Gravida Para Term Preterm AB SAB TAB Ectopic Multiple Living  1             # Outcome Date GA Lbr Len/2nd Weight Sex Delivery Anes PTL Lv  1 Current               Past Medical History  Diagnosis Date  . Hypertension   . Gastroparesis   . Anxiety   . Infection     UTI April 2016  . Fibromyalgia   . Diabetes mellitus   . Diabetes type 1, uncontrolled (HCC) 11/14/2011    Since age 67    Family History  Problem Relation Age of Onset  . Diabetes Mother   . Hypertension Father     Social History   Social History  . Marital Status: Married    Spouse Name: sergio  . Number of Children: 0  . Years of Education: college   Occupational History  . [employer]    Social History Main Topics  . Smoking status: Never Smoker   . Smokeless tobacco: Never Used  . Alcohol Use: No     Comment: Occasional, once a month mixed drink- when not pregnant  . Drug Use: No  . Sexual Activity:    Partners: Male    Birth Control/ Protection: None   Other Topics Concern  . None   Social History Narrative    Allergies  Allergen Reactions  . Peanut-Containing Drug Products Anaphylaxis and Other (See Comments)    Pt states that she is allergic to [place] nuts.        No current facility-administered medications on file prior to encounter.   Current Outpatient Prescriptions on File Prior to Encounter  Medication Sig Dispense Refill  . acetaminophen (TYLENOL) 500 MG tablet Take 1,000 mg by mouth every 6 (six) hours  as needed for mild pain or headache.     Marland Kitchen aspirin 81 MG chewable tablet Chew 1 tablet (81 mg total) by mouth daily. 30 tablet 12  . butalbital-acetaminophen-caffeine (FIORICET) 50-325-40 MG per tablet Take 1-2 tablets by mouth every 6 (six) hours as needed for headache. 20 tablet 0  . Cholecalciferol (VITAMIN D PO) Take 2 each by mouth daily.     Marland Kitchen doxylamine, Sleep, (UNISOM) 25 MG tablet Take 50 mg by mouth at bedtime as needed for sleep.     Marland Kitchen Doxylamine-Pyridoxine 10-10 MG TBEC Start with 2 tablets every evening. If symptoms persist, add 1 tablet every morning. If symptoms persist, add 1 tablet at mid-day. (Patient taking differently: Take 2 tablets by mouth at bedtime. ) 60 tablet 3  . EPINEPHrine (EPIPEN 2-PAK) 0.3 mg/0.3 mL IJ SOAJ injection Inject 0.3 mLs (0.3 mg total) into the muscle once. (Patient taking differently: Inject 0.3 mg into the muscle once as needed (for severe allergic reaction). ) 1 Device 1  . glucose 4 GM chewable tablet Chew 1 tablet (4 g total) by mouth as needed for low blood sugar. 50 tablet 12  .  insulin detemir (LEVEMIR) 100 UNIT/ML injection Inject 14 Units into the skin 2 (two) times daily.    . insulin lispro (HUMALOG) 100 UNIT/ML injection Inject 2-12 Units into the skin 3 (three) times daily as needed for high blood sugar. Pt uses as needed per sliding scale.    Marland Kitchen ketotifen (ZADITOR) 0.025 % ophthalmic solution Place 1 drop into both eyes 2 (two) times daily as needed (dry eyes).    Marland Kitchen NIFEdipine (PROCARDIA) 10 MG capsule Take 1 capsule (10 mg total) by mouth every 6 (six) hours as needed (contractions more than 5 pr hour). 30 capsule 0  . Prenat w/o A-FeCbn-Meth-FA-DHA (OB COMPLETE GOLD) 27.5-1-200 MG CAPS Take 1 tablet by mouth daily. (Patient taking differently: Take 1 capsule by mouth daily. ) 30 capsule 12  . promethazine (PHENERGAN) 25 MG tablet Take 1 tablet (25 mg total) by mouth every 6 (six) hours as needed for nausea or vomiting. 30 tablet 0  . ranitidine  (ZANTAC) 150 MG capsule Take 1 capsule (150 mg total) by mouth 2 (two) times daily. 60 capsule 5     Review of Systems  Eyes: Negative for visual disturbance.  Gastrointestinal: Positive for abdominal pain (occasional contractions).  Neurological: Negative for headaches.  All other systems reviewed and are negative.    Physical Exam   Filed Vitals:   01/16/15 1745 01/16/15 1800 01/16/15 1815 01/16/15 1830  BP: 143/92 187/107 183/108 158/100  Pulse: 99 93 95 93  Temp:      TempSrc:      Resp:      Weight:        Physical Exam  Constitutional: She is oriented to person, place, and time. She appears well-developed and well-nourished. No distress.  HENT:  Head: Normocephalic.  Eyes: EOM are normal. Pupils are equal, round, and reactive to light.  Neck: Neck supple.  Cardiovascular: Normal rate, regular rhythm and normal heart sounds.   Respiratory: Effort normal and breath sounds normal.  GI: Soft. There is no rebound and no guarding.  Genitourinary: No bleeding in the vagina.  Musculoskeletal: Normal range of motion. She exhibits edema (2+ pedal (bilat)).  Neurological: She is alert and oriented to person, place, and time. She has normal reflexes. She displays normal reflexes.  Skin: Skin is warm and dry.    MAU Course  Procedures  MDM Results for orders placed or performed during the hospital encounter of 01/16/15 (from the past 24 hour(s))  Protein / creatinine ratio, urine     Status: Abnormal   Collection Time: 01/16/15  5:00 PM  Result Value Ref Range   Creatinine, Urine 80.00 mg/dL   Total Protein, Urine 13 mg/dL   Protein Creatinine Ratio 0.16 (H) 0.00 - 0.15 mg/mg[Cre]  CBC     Status: Abnormal   Collection Time: 01/16/15  6:00 PM  Result Value Ref Range   WBC 4.7 4.0 - 10.5 K/uL   RBC 3.63 (L) 3.87 - 5.11 MIL/uL   Hemoglobin 11.1 (L) 12.0 - 15.0 g/dL   HCT 40.9 (L) 81.1 - 91.4 %   MCV 89.0 78.0 - 100.0 fL   MCH 30.6 26.0 - 34.0 pg   MCHC 34.4 30.0 -  36.0 g/dL   RDW 78.2 95.6 - 21.3 %   Platelets 182 150 - 400 K/uL  Comprehensive metabolic panel     Status: Abnormal   Collection Time: 01/16/15  6:00 PM  Result Value Ref Range   Sodium 132 (L) 135 - 145 mmol/L  Potassium 4.4 3.5 - 5.1 mmol/L   Chloride 106 101 - 111 mmol/L   CO2 23 22 - 32 mmol/L   Glucose, Bld 313 (H) 65 - 99 mg/dL   BUN 13 6 - 20 mg/dL   Creatinine, Ser 1.91 0.44 - 1.00 mg/dL   Calcium 9.2 8.9 - 47.8 mg/dL   Total Protein 6.7 6.5 - 8.1 g/dL   Albumin 2.5 (L) 3.5 - 5.0 g/dL   AST 19 15 - 41 U/L   ALT 14 14 - 54 U/L   Alkaline Phosphatase 89 38 - 126 U/L   Total Bilirubin 0.6 0.3 - 1.2 mg/dL   GFR calc non Af Amer >60 >60 mL/min   GFR calc Af Amer >60 >60 mL/min   Anion gap 3 (L) 5 - 15   Consulted with Dr. Clearance Coots > admit to antenatal for BMZ, magnesium sulfate and labetalol protocol.    Assessment and Plan  24 y.o. G1P0 at [redacted]w[redacted]d IUP  Uncontrolled Hypertension Diabetes Mellitis - Insulin  Admit to Antenatal BMZ 12 mg now and repeat in 24 hours Magnesium Sulfate 4 gm load/2gm maint Initiate labetalol protocol Dr. Clearance Coots will contact pharmacy and enter appropriate insulin orders (See other orders)  Marlis Edelson, CNM 01/16/2015 7:03 PM

## 2015-01-16 NOTE — MAU Note (Signed)
Report called to Ante RN. Will go to 157 after IV

## 2015-01-16 NOTE — ED Notes (Signed)
Dr. Jodi Mourning notified of elevated BP's, pt to MAU for evaluation.

## 2015-01-16 NOTE — Progress Notes (Signed)
CNM notified of second elevated BP

## 2015-01-16 NOTE — Progress Notes (Signed)
Called to notify of last BP being taken during lab drawing blood but wanted to make CNM aware of elevated pressure. Will continue to monitor and let CNM know if there is a second elevated reading

## 2015-01-17 ENCOUNTER — Encounter: Payer: 59 | Admitting: Obstetrics

## 2015-01-17 LAB — GLUCOSE, CAPILLARY
Glucose-Capillary: 104 mg/dL — ABNORMAL HIGH (ref 65–99)
Glucose-Capillary: 111 mg/dL — ABNORMAL HIGH (ref 65–99)
Glucose-Capillary: 116 mg/dL — ABNORMAL HIGH (ref 65–99)
Glucose-Capillary: 118 mg/dL — ABNORMAL HIGH (ref 65–99)
Glucose-Capillary: 124 mg/dL — ABNORMAL HIGH (ref 65–99)
Glucose-Capillary: 126 mg/dL — ABNORMAL HIGH (ref 65–99)
Glucose-Capillary: 127 mg/dL — ABNORMAL HIGH (ref 65–99)
Glucose-Capillary: 129 mg/dL — ABNORMAL HIGH (ref 65–99)
Glucose-Capillary: 150 mg/dL — ABNORMAL HIGH (ref 65–99)
Glucose-Capillary: 164 mg/dL — ABNORMAL HIGH (ref 65–99)
Glucose-Capillary: 174 mg/dL — ABNORMAL HIGH (ref 65–99)
Glucose-Capillary: 176 mg/dL — ABNORMAL HIGH (ref 65–99)
Glucose-Capillary: 178 mg/dL — ABNORMAL HIGH (ref 65–99)
Glucose-Capillary: 180 mg/dL — ABNORMAL HIGH (ref 65–99)
Glucose-Capillary: 200 mg/dL — ABNORMAL HIGH (ref 65–99)
Glucose-Capillary: 235 mg/dL — ABNORMAL HIGH (ref 65–99)
Glucose-Capillary: 283 mg/dL — ABNORMAL HIGH (ref 65–99)
Glucose-Capillary: 323 mg/dL — ABNORMAL HIGH (ref 65–99)
Glucose-Capillary: 325 mg/dL — ABNORMAL HIGH (ref 65–99)
Glucose-Capillary: 373 mg/dL — ABNORMAL HIGH (ref 65–99)
Glucose-Capillary: 413 mg/dL — ABNORMAL HIGH (ref 65–99)
Glucose-Capillary: 64 mg/dL — ABNORMAL LOW (ref 65–99)
Glucose-Capillary: 98 mg/dL (ref 65–99)

## 2015-01-17 MED ORDER — SODIUM CHLORIDE 0.9 % IV SOLN
INTRAVENOUS | Status: DC | PRN
  Administered 2015-01-17: 7 [IU]/h via INTRAVENOUS
  Administered 2015-01-17: 10.5 [IU]/h via INTRAVENOUS
  Administered 2015-01-17: 0.5 [IU]/h via INTRAVENOUS
  Administered 2015-01-17: 3.1 [IU]/h via INTRAVENOUS
  Administered 2015-01-18: 04:00:00 via INTRAVENOUS
  Filled 2015-01-17 (×2): qty 2.5

## 2015-01-17 MED ORDER — PROMETHAZINE HCL 25 MG/ML IJ SOLN
12.5000 mg | Freq: Four times a day (QID) | INTRAMUSCULAR | Status: DC | PRN
  Administered 2015-01-17 – 2015-01-24 (×18): 12.5 mg via INTRAVENOUS
  Filled 2015-01-17 (×18): qty 1

## 2015-01-17 MED ORDER — INSULIN REGULAR BOLUS VIA INFUSION
0.0000 [IU] | Freq: Three times a day (TID) | INTRAVENOUS | Status: DC | PRN
  Filled 2015-01-17: qty 10

## 2015-01-17 MED ORDER — LACTATED RINGERS IV SOLN
INTRAVENOUS | Status: DC
  Administered 2015-01-17: 02:00:00 via INTRAVENOUS

## 2015-01-17 MED ORDER — INSULIN ASPART 100 UNIT/ML ~~LOC~~ SOLN
11.0000 [IU] | Freq: Once | SUBCUTANEOUS | Status: AC
  Administered 2015-01-17: 11 [IU] via SUBCUTANEOUS

## 2015-01-17 MED ORDER — INSULIN ASPART 100 UNIT/ML ~~LOC~~ SOLN
0.0000 [IU] | SUBCUTANEOUS | Status: DC
Start: 2015-01-17 — End: 2015-01-18
  Administered 2015-01-18: 4 [IU] via SUBCUTANEOUS

## 2015-01-17 NOTE — Consult Note (Signed)
MFM Note  [name] is a 24 year old G1 AA female at 33+5 weeks who was admitted yesterday with elevated BPs. Her prenatal course has been complicated by type 1 DM, elevated DSR, low PAPP-A and polyhydramnios. She has a history of hypertension but has not required medication in years. She sees an endocrinologist, Dr. Sharl Ma, for diabetes management. Since admission she has received a course of BMZ, magnesium sulfate and po labetalol. Due to elevated BSs after the BMZ, she is now on an insulin drip with hourly BSs obtained. Initial BPs were 180s/100s but responded well to labetalol.  HELLP labs, Cr and urine protein were WNLs.  [place] at 30+ weeks: EFW at the 77th %tile and polyhydramnios with an AFI of 30  Antenatal testing has been reassuring  Assessment and plan:  1) SIUP at 33+5 weeks 2) Exacerbation of chronic hypertension; currently without severe features; BPs responded well to labetalol 3) Type 1 DM; currently on insulin drip - BSs controlled 4) Polyhydramnios 5) Increased DSR on FTS; declined further testing 6) Low PAPP-A  Suggest: Continuing magnesium until tomorrow and then dc if BPs < 150/100 Continue insulin drip through tomorrow (last BMZ given at ~ 8 PM tonight); then transition back to most recent insulin doses of Levemir and Humalog Increase labetalol as needed to keep BPs 140s-150s/80-90s Consider outpt management when BPs and BSs controlled   Thank you for the kind referral.  (Face-to-face consultation with patient: 30 min)

## 2015-01-18 LAB — GLUCOSE, CAPILLARY
Glucose-Capillary: 100 mg/dL — ABNORMAL HIGH (ref 65–99)
Glucose-Capillary: 107 mg/dL — ABNORMAL HIGH (ref 65–99)
Glucose-Capillary: 141 mg/dL — ABNORMAL HIGH (ref 65–99)
Glucose-Capillary: 195 mg/dL — ABNORMAL HIGH (ref 65–99)
Glucose-Capillary: 216 mg/dL — ABNORMAL HIGH (ref 65–99)
Glucose-Capillary: 226 mg/dL — ABNORMAL HIGH (ref 65–99)
Glucose-Capillary: 233 mg/dL — ABNORMAL HIGH (ref 65–99)
Glucose-Capillary: 261 mg/dL — ABNORMAL HIGH (ref 65–99)
Glucose-Capillary: 313 mg/dL — ABNORMAL HIGH (ref 65–99)
Glucose-Capillary: 60 mg/dL — ABNORMAL LOW (ref 65–99)
Glucose-Capillary: 69 mg/dL (ref 65–99)
Glucose-Capillary: 81 mg/dL (ref 65–99)

## 2015-01-18 MED ORDER — INSULIN ASPART 100 UNIT/ML ~~LOC~~ SOLN
0.0000 [IU] | Freq: Four times a day (QID) | SUBCUTANEOUS | Status: DC
  Administered 2015-01-18: 16 [IU] via SUBCUTANEOUS
  Administered 2015-01-18: 12 [IU] via SUBCUTANEOUS
  Administered 2015-01-19: 4 [IU] via SUBCUTANEOUS
  Administered 2015-01-19: 12 [IU] via SUBCUTANEOUS
  Administered 2015-01-19: 8 [IU] via SUBCUTANEOUS
  Administered 2015-01-20: 2 [IU] via SUBCUTANEOUS
  Administered 2015-01-20: 8 [IU] via SUBCUTANEOUS
  Administered 2015-01-20: 2 [IU] via SUBCUTANEOUS
  Administered 2015-01-20: 4 [IU] via SUBCUTANEOUS
  Administered 2015-01-21: 2 [IU] via SUBCUTANEOUS
  Administered 2015-01-21 (×2): 4 [IU] via SUBCUTANEOUS
  Administered 2015-01-22: 8 [IU] via SUBCUTANEOUS

## 2015-01-18 MED ORDER — NIFEDIPINE 10 MG PO CAPS
10.0000 mg | ORAL_CAPSULE | ORAL | Status: AC
  Administered 2015-01-18 (×3): 10 mg via ORAL
  Filled 2015-01-18: qty 1

## 2015-01-18 MED ORDER — MAGNESIUM SULFATE 4 GM/100ML IV SOLN: 4.0000 g | Freq: Once | INTRAVENOUS | Status: DC

## 2015-01-18 MED ORDER — DEXTROSE 50 % IV SOLN
INTRAVENOUS | Status: AC
  Filled 2015-01-18: qty 50

## 2015-01-18 MED ORDER — MAGNESIUM SULFATE 50 % IJ SOLN
2.0000 g/h | INTRAVENOUS | Status: DC
  Administered 2015-01-18: 2 g/h via INTRAVENOUS
  Filled 2015-01-18: qty 80

## 2015-01-18 MED ORDER — MAGNESIUM SULFATE 50 % IJ SOLN: 2.0000 g/h | INTRAVENOUS | Status: DC

## 2015-01-18 MED ORDER — DEXTROSE 50 % IV SOLN
INTRAVENOUS | Status: AC
  Administered 2015-01-18: 12 mL via INTRAVENOUS
  Filled 2015-01-18: qty 50

## 2015-01-18 MED ORDER — MAGNESIUM SULFATE 50 % IJ SOLN
2.0000 g/h | INTRAVENOUS | Status: DC
  Filled 2015-01-18 (×2): qty 80

## 2015-01-18 MED ORDER — NIFEDIPINE ER OSMOTIC RELEASE 30 MG PO TB24
30.0000 mg | ORAL_TABLET | Freq: Two times a day (BID) | ORAL | Status: DC
  Administered 2015-01-18 – 2015-01-19 (×2): 30 mg via ORAL
  Filled 2015-01-18 (×2): qty 1

## 2015-01-18 MED ORDER — DOCUSATE SODIUM 100 MG PO CAPS
100.0000 mg | ORAL_CAPSULE | Freq: Two times a day (BID) | ORAL | Status: DC
  Administered 2015-01-18 – 2015-01-23 (×12): 100 mg via ORAL
  Filled 2015-01-18 (×12): qty 1

## 2015-01-18 MED ORDER — BUTORPHANOL TARTRATE 1 MG/ML IJ SOLN
1.0000 mg | INTRAMUSCULAR | Status: DC | PRN
  Administered 2015-01-18 – 2015-01-24 (×23): 1 mg via INTRAVENOUS
  Filled 2015-01-18 (×23): qty 1

## 2015-01-18 MED ORDER — MAGNESIUM SULFATE BOLUS VIA INFUSION
4.0000 g | Freq: Once | INTRAVENOUS | Status: AC
  Administered 2015-01-18: 4 g via INTRAVENOUS
  Filled 2015-01-18: qty 500

## 2015-01-18 MED ORDER — OXYCODONE-ACETAMINOPHEN 5-325 MG PO TABS
1.0000 | ORAL_TABLET | ORAL | Status: DC | PRN
  Administered 2015-01-22 – 2015-01-23 (×2): 2 via ORAL
  Filled 2015-01-18 (×2): qty 2

## 2015-01-18 MED ORDER — INSULIN ASPART 100 UNIT/ML ~~LOC~~ SOLN
0.0000 [IU] | Freq: Three times a day (TID) | SUBCUTANEOUS | Status: DC
  Administered 2015-01-19: 3 [IU] via SUBCUTANEOUS
  Administered 2015-01-19: 6 [IU] via SUBCUTANEOUS
  Administered 2015-01-20: 4 [IU] via SUBCUTANEOUS
  Administered 2015-01-20: 3 [IU] via SUBCUTANEOUS
  Administered 2015-01-21: 5 [IU] via SUBCUTANEOUS
  Administered 2015-01-21 – 2015-01-22 (×2): 2 [IU] via SUBCUTANEOUS

## 2015-01-18 MED ORDER — DEXTROSE 50 % IV SOLN
16.0000 mL | Freq: Once | INTRAVENOUS | Status: AC
  Administered 2015-01-18: 12 mL via INTRAVENOUS
  Administered 2015-01-18: 16 mL via INTRAVENOUS

## 2015-01-18 MED ORDER — NALBUPHINE HCL 10 MG/ML IJ SOLN
10.0000 mg | INTRAMUSCULAR | Status: DC | PRN
  Administered 2015-01-18 – 2015-01-25 (×4): 10 mg via INTRAVENOUS
  Filled 2015-01-18 (×4): qty 1

## 2015-01-18 NOTE — Progress Notes (Signed)
Pt ordered breakfast tray, tray never arrived. Pt eating snack, CBG checked. Pts home regimen is to check before meals, not snacks.  Pt ordered lunch tray will check CBG again and dose insulin accordingly.

## 2015-01-18 NOTE — Progress Notes (Signed)
Inpatient Diabetes Program Recommendations  ADA Standards of Care 2016 Diabetes in Pregnancy Target Glucose Ranges:  Fasting: 60 - 90 mg/dL Preprandial: 60 - 415 mg/dL 1 hr postprandial: Less than 140mg /dL (from first bite of meal) 2 hr postprandial: Less than 120 mg/dL (from first bit of meal)  Review of Glycemic Control  Diabetes history: DM1 Outpatient Diabetes medications: Levemir 14 units BID, Humalog 2-12 units TID with meals Current orders for Inpatient glycemic control: Levemir 14 units BID, Novolog 0-24 units Q4H, Novolog 0-16 units TID with meals  Inpatient Diabetes Program Recommendations: Correction (SSI): Please consider changing Novolog 0-24 units to QID (fasting and 2 hour post prandial). Insulin - Meal Coverage: Please consider discontinuing Novolog 0-16 units TID with meals and order Novolog 0-10 units TID with meals in which 1 unit covers 8 grams of carbohydrates.  Spoke with patient regarding diabetes and current home regimen for diabetes control. Patient states that she is followed by Dr. Sharl Ma and that she is currently taking Levemir 14 units BID and Humalog carb coverage of 1 unit for every 8 grams of carbohydrates plus Humalog for correction (1 unit for every 30 mg/dl over her target glucose of 80 mg/dl). Patient states that she last seen Dr. Sharl Ma about 2 weeks ago and that she is seeing him about every 2 weeks. Patient states that her fasting  glucose has been trending 100-120 mg/dl and her glucose throughout the day is usually 100-210 mg/dl. Patient is very knowledgeable about diabetes and target glucose goals during pregnancy. Would recommend changing carb coverage to Novolog 0-10 units TID with meals in which 1 unit covers 8 grams of carbs. Patient verbalized understanding of information discussed and states that she has no further questions at this time. Diabetes Coordinator will continue to follow along and make further recommendations as more data is collected. If RN  or MD has any questions, please page.  Thanks, Orlando Penner, RN, MSN, CCRN, CDE Diabetes Coordinator Inpatient Diabetes Program [phone] (Team Pager from 8am to 5pm) [phone] ([place] office) [phone] [facility] office) [phone] [facility] office)

## 2015-01-18 NOTE — Progress Notes (Signed)
Patient ID: ANDI ORDERS, female   DOB: 01-26-1991, 24 y.o.   MRN: [id number]  Hospital Day: 3  S: Tolerating PO, no problems voiding.  Does not feel any contractions.  Reports fatigue  O: Blood pressure 125/72, pulse 99, temperature 97.7 F (36.5 C), temperature source Oral, resp. rate 18, height 5\' 7"  (1.702 m), weight 215 lb (97.523 kg), last menstrual period 05/26/2014, SpO2 95 %.   URK:YHCWCBJS: 130 bpm, Variability: Good {> 6 bpm), Accelerations: Reactive and Decelerations: Absent Toco: None SVE:   A/P- 24 y.o. admitted with: elevated blood pressures and uncontrolled IDDM  Present on Admission:  . Hypertension in pregnancy, antepartum  Pregnancy Complications: hypertension and uncontrolled IDDM  Preterm labor management: bedrest advised, pelvic rest advised and Procardia Dating:  [redacted]w[redacted]d PNL Needed:  None  FWB:  good PTL:  Stable  Anticipate D/C of magnesium sulfate today.  Glucose stabilizer d/c this AM.

## 2015-01-18 NOTE — Progress Notes (Signed)
Initial Nutrition Assessment  DOCUMENTATION CODES:   Not applicable  INTERVENTION:  Carbohydrate modified gestational diabetic diet  NUTRITION DIAGNOSIS:   Increased nutrient needs related to  (pregnancy and fetal growth requirments) as evidenced by  ([redacted] weeks pregnant).  GOAL:   Patient will meet greater than or equal to 90% of their needs  MONITOR:   Weight trends  REASON FOR ASSESSMENT:   Antenatal, Gestational Diabetes    ASSESSMENT:   33 6/7 weeks with PIH, DM II. Pt indicates diet is tolerated well. Indicates she understands diet parameters. Excessive weight gain, 44 lbs Pre-pregnancy BMI 26.8, desired weight gain 15-25 lbs Followed by Dr Sharl Ma ( endo)   Diet Order:  Diet gestational carb mod Room service appropriate?: Yes; Fluid consistency:: Thin Pt does not consume milk, provision made for pt to order her own snacks  Skin:  Reviewed, no issues Height:   Ht Readings from Last 1 Encounters:  01/16/15 5\' 7"  (1.702 m)   Weight:   Wt Readings from Last 1 Encounters:  01/16/15 215 lb (97.523 kg)   Usual weight 171 lbs Ideal Body Weight:  61.3 kg  BMI:  Body mass index is 33.67 kg/(m^2).  Estimated Nutritional Needs:   Kcal:  2100-2300  Protein:  89-99 g  Fluid:  2.4 L  EDUCATION NEEDS:   No education needs identified at this time Pt recieved education with Diabetic Educator outpt in May  Kandas Oliveto M.Odis Luster LDN Neonatal Nutrition Support Specialist/RD III Pager [phone]      Phone [phone]

## 2015-01-18 NOTE — Progress Notes (Signed)
Pts insulin drip turned off at 0830.  Levemir was given at 0930. Pt increase in CBG was discussed with Diabetes coordinator and related to lapse in insulin coverage. Will continue to monitor.

## 2015-01-19 ENCOUNTER — Ambulatory Visit (HOSPITAL_COMMUNITY): Payer: 59

## 2015-01-19 ENCOUNTER — Other Ambulatory Visit (HOSPITAL_COMMUNITY): Payer: 59

## 2015-01-19 LAB — GLUCOSE, CAPILLARY
Glucose-Capillary: 144 mg/dL — ABNORMAL HIGH (ref 65–99)
Glucose-Capillary: 192 mg/dL — ABNORMAL HIGH (ref 65–99)
Glucose-Capillary: 217 mg/dL — ABNORMAL HIGH (ref 65–99)
Glucose-Capillary: 288 mg/dL — ABNORMAL HIGH (ref 65–99)

## 2015-01-19 MED ORDER — MAGNESIUM SULFATE 50 % IJ SOLN
2.0000 g/h | INTRAVENOUS | Status: DC
  Administered 2015-01-19 – 2015-01-21 (×2): 2 g/h via INTRAVENOUS
  Filled 2015-01-19 (×3): qty 80

## 2015-01-19 NOTE — Progress Notes (Signed)
Results for NANDI, TEAHAN (MRN [id number]) as of 01/19/2015 12:48  Ref. Range 01/18/2015 16:07 01/18/2015 22:38 01/19/2015 05:56 01/19/2015 11:24  Glucose-Capillary Latest Ref Range: 65-99 mg/dL 662 (H) 947 (H) 654 (H) 144 (H)  Noted that fasting CBGs have been elevated. Recommend increasing Levemir to 16 units BID. Will continue to monitor blood sugars while in the hospital. Smith Mince RN BSN CDE

## 2015-01-19 NOTE — Progress Notes (Signed)
Patient ID: JAHNASIA OHRT, female   DOB: 1990-04-03, 24 y.o.   MRN: [id number]  Hospital Day: 4  S: Preterm labor symptoms: low back pain and pelvic pressure, states that she does not feel any contractions right now and was better overnight with the medication.    O: Blood pressure 120/81, pulse 92, temperature 97.7 F (36.5 C), temperature source Oral, resp. rate 18, height 5\' 7"  (1.702 m), weight 215 lb (97.523 kg), last menstrual period 05/26/2014, SpO2 96 %.   FWY:OVZCHYIF: 140 bpm, Variability: Fair (1-6 bpm), Accelerations: non-reactive and Decelerations: Variable: mild Toco: None currently and Date/time of onset: 10/20, roughly 1600 OYD:XAJOINOM: Fingertip  A/P- 24 y.o. admitted with: uncontrolled IDDM and elevated blood pressures  Present on Admission:  . Hypertension in pregnancy, antepartum  Pregnancy Complications: hypertension and IDDM  Preterm labor management: bedrest advised, pelvic rest advised and Procardia Dating:  [redacted]w[redacted]d Plan:  Continue current care.  Betamethasone X2 given 10/19&10/20.

## 2015-01-20 LAB — GLUCOSE, CAPILLARY
Glucose-Capillary: 169 mg/dL — ABNORMAL HIGH (ref 65–99)
Glucose-Capillary: 148 mg/dL — ABNORMAL HIGH (ref 65–99)
Glucose-Capillary: 136 mg/dL — ABNORMAL HIGH (ref 65–99)
Glucose-Capillary: 208 mg/dL — ABNORMAL HIGH (ref 65–99)

## 2015-01-20 NOTE — Progress Notes (Signed)
Inpatient Diabetes Program Recommendations  Diabetes Treatment Program Recommendations  ADA Standards of Care 2016 Diabetes in Pregnancy Target Glucose Ranges:  Fasting: 60 - 90 mg/dL Preprandial: 60 - 288 mg/dL 1 hr postprandial: Less than 140mg /dL (from first bite of meal) 2 hr postprandial: Less than 120 mg/dL (from first bit of meal)  Diabetes history: Type 1 Diabetes  Note fasting blood sugars elevated.  Consider increasing Levemir to 18 units bid.    Thanks, Beryl Meager, RN, BC-ADM Inpatient Diabetes Coordinator Pager [phone] (8a-5p)

## 2015-01-20 NOTE — Progress Notes (Signed)
Patient ID: [name], female   DOB: [birth year], 24 y.o.   MRN: [id number] Hospital Day: 5  S: Preterm labor symptoms: Elevated BP and poorly controlled glucose.  Responded well to therapy.  Continue current management.  O: Blood pressure 127/86, pulse 94, temperature 98 F (36.7 C), temperature source Oral, resp. rate 18, height 5\' 7"  (1.702 m), weight 215 lb (97.523 kg), last menstrual period 05/26/2014, SpO2 94 %.   EEF:EOFHQRFX: 140 bpm Toco: UC's q 7 minutes, mild JOI:TGPQDIYM: Fingertip  A/P- 24 y.o. admitted with:   Present on Admission:  . Hypertension in pregnancy, antepartum  Pregnancy Complications: Diabetes  Preterm labor management: IV D5LR started and Magnesium sulfate Dating:  [redacted]w[redacted]d PNL Needed:  none FWB:  good PTL:  stable

## 2015-01-21 LAB — GLUCOSE, CAPILLARY
Glucose-Capillary: 190 mg/dL — ABNORMAL HIGH (ref 65–99)
Glucose-Capillary: 156 mg/dL — ABNORMAL HIGH (ref 65–99)
Glucose-Capillary: 168 mg/dL — ABNORMAL HIGH (ref 65–99)

## 2015-01-21 NOTE — Progress Notes (Signed)
Diabetes Treatment Program Recommendations  ADA Standards of Care 2016 Diabetes in Pregnancy Target Glucose Ranges:  Fasting: 60 - 90 mg/dL Preprandial: 60 - 449 mg/dL 1 hr postprandial: Less than 140mg /dL (from first bite of meal) 2 hr postprandial: Less than 120 mg/dL (from first bit of meal)  Please increase Levemir to 18 units bid.  May also need adjustment in CHO coverage however if fasting blood glucoses at goal, this will help with post-prandial increases as well.  Called and discussed with RN.  Will follow.  Thanks, Beryl Meager, RN, BC-ADM Inpatient Diabetes Coordinator Pager [phone] (8a-5p)

## 2015-01-21 NOTE — Progress Notes (Signed)
Patient ID: JOZIE WULF, female   DOB: 11/21/1990, 24 y.o.   MRN: [id number] Hospital Day: 6  S: Preterm labor symptoms: low back pain and pelvic pressure  O: Blood pressure 140/75, pulse 93, temperature 98.3 F (36.8 C), temperature source Oral, resp. rate 18, height 5' 7"  (1.702 m), weight 215 lb (97.523 kg), last menstrual period 05/26/2014, SpO2 94 %.   VOH:KGOVPCHE: 140 bpm Toco: Irregular UC's KBT:CYELYHTM: Fingertip  A/P- 24 y.o. admitted with: Elevated BP and UC's.  Responding well to therapy.  Continue current management.   Present on Admission:  . Hypertension in pregnancy, antepartum  Pregnancy Complications: hypertension and preterm labor   Preterm labor management: IV D5LR started, bedrest advised, pelvic rest advised and Magnesium Sulfate Dating:  72w2dPNL Needed:  none FWB:  good PTL:  stable

## 2015-01-22 LAB — GLUCOSE, CAPILLARY
Glucose-Capillary: 216 mg/dL — ABNORMAL HIGH (ref 65–99)
Glucose-Capillary: 257 mg/dL — ABNORMAL HIGH (ref 65–99)
Glucose-Capillary: 99 mg/dL (ref 65–99)
Glucose-Capillary: 62 mg/dL — ABNORMAL LOW (ref 65–99)
Glucose-Capillary: 60 mg/dL — ABNORMAL LOW (ref 65–99)
Glucose-Capillary: 62 mg/dL — ABNORMAL LOW (ref 65–99)
Glucose-Capillary: 77 mg/dL (ref 65–99)
Glucose-Capillary: 142 mg/dL — ABNORMAL HIGH (ref 65–99)

## 2015-01-22 LAB — TYPE AND SCREEN
ABO/RH(D): O POS
Antibody Screen: NEGATIVE

## 2015-01-22 LAB — ABO/RH: ABO/RH(D): O POS

## 2015-01-22 MED ORDER — INSULIN ASPART 100 UNIT/ML ~~LOC~~ SOLN
0.0000 [IU] | Freq: Four times a day (QID) | SUBCUTANEOUS | Status: DC
  Administered 2015-01-22: 12 [IU] via SUBCUTANEOUS
  Administered 2015-01-22: 4 [IU] via SUBCUTANEOUS
  Administered 2015-01-23: 3 [IU] via SUBCUTANEOUS
  Administered 2015-01-23 – 2015-01-24 (×4): 4 [IU] via SUBCUTANEOUS

## 2015-01-22 MED ORDER — INSULIN ASPART 100 UNIT/ML ~~LOC~~ SOLN: 6.0000 [IU] | Freq: Three times a day (TID) | SUBCUTANEOUS | Status: DC

## 2015-01-22 MED ORDER — LABETALOL HCL 300 MG PO TABS: 300.0000 mg | ORAL_TABLET | Freq: Three times a day (TID) | ORAL | Status: DC

## 2015-01-22 MED ORDER — INSULIN DETEMIR 100 UNIT/ML ~~LOC~~ SOLN
18.0000 [IU] | Freq: Two times a day (BID) | SUBCUTANEOUS | Status: DC
Start: 2015-01-22 — End: 2015-01-25
  Administered 2015-01-22 – 2015-01-24 (×6): 18 [IU] via SUBCUTANEOUS
  Filled 2015-01-22 (×7): qty 0.18

## 2015-01-22 MED ORDER — INSULIN ASPART 100 UNIT/ML ~~LOC~~ SOLN
0.0000 [IU] | Freq: Three times a day (TID) | SUBCUTANEOUS | Status: DC
  Administered 2015-01-22: 4 [IU] via SUBCUTANEOUS
  Administered 2015-01-22: 6 [IU] via SUBCUTANEOUS
  Administered 2015-01-23: 4 [IU] via SUBCUTANEOUS
  Administered 2015-01-23: 2 [IU] via SUBCUTANEOUS
  Administered 2015-01-24 (×2): 4 [IU] via SUBCUTANEOUS
  Administered 2015-01-24: 7 [IU] via SUBCUTANEOUS

## 2015-01-22 MED ORDER — LABETALOL HCL 300 MG PO TABS
300.0000 mg | ORAL_TABLET | Freq: Four times a day (QID) | ORAL | Status: DC
  Administered 2015-01-22 – 2015-01-25 (×11): 300 mg via ORAL
  Filled 2015-01-22 (×15): qty 1

## 2015-01-22 MED ORDER — NIFEDIPINE ER OSMOTIC RELEASE 30 MG PO TB24
30.0000 mg | ORAL_TABLET | Freq: Two times a day (BID) | ORAL | Status: DC
  Administered 2015-01-22 (×2): 30 mg via ORAL
  Filled 2015-01-22 (×2): qty 1

## 2015-01-22 NOTE — Progress Notes (Signed)
Pt's husband brought grilled chicken salad from grocery store salad bar for her dinner. There was grilled chicken, 2 types of cheese, tomatoes and creamy caesar dressing (large quantity). Also, pt's husband brought her "bazooka" chewing gum.   Pt advised that these food choices were not conducive to tight glucose control. Using teach back, she was able to recall the procedure for counting carbs and insulin coverage and voiced that she did not realize the amount of carbs in the chewing gum.  For her evening meal (the salad from the grocery store) the patient estimated that it was 30 gm carbs. Covered her CBG with 6 units of SSI. Advised pt that going forward she should only eat/order food from the hospital dietary services.  She verbalized understanding.

## 2015-01-22 NOTE — Plan of Care (Signed)
Problem: Phase I Progression Outcomes Goal: LOS < 4 days Outcome: Not Met (add Reason) Patient here for prolonged hospitalization     

## 2015-01-22 NOTE — Progress Notes (Signed)
Nutrition:  Diet Consult for teaching of CHO counting. Pt not feeling well at time of visit ( nausea) Pt indicates she understands how to count CHO's, and did this at home PTA. Diet education during this pregnancy is documented. Will check back with pt Tuesday or Wednesday to see if further conversation is needed about how to count CHO"s.  [name] M.Odis Luster LDN Neonatal Nutrition Support Specialist/RD III Pager [phone]      Phone [phone]

## 2015-01-22 NOTE — Progress Notes (Signed)
Inpatient Diabetes Program Recommendations  Diabetes Treatment Program Recommendations  ADA Standards of Care 2016 Diabetes in Pregnancy Target Glucose Ranges:  Fasting: 60 - 90 mg/dL Preprandial: 60 - 352 mg/dL 1 hr postprandial: Less than 140mg /dL (from first bite of meal) 2 hr postprandial: Less than 120 mg/dL (from first bit of meal)   Review of Glycemic Control Spoke with Dr Clearance Coots this am regarding insulin therapy changes according to cbg pattern over the weekend. Reviewed with Dr Laural Roes is to co-sign orders.  Thank you Lenor Coffin, RN, MSN, CDE  Diabetes Inpatient Program Office: [phone] Pager: [phone] 8:00 am to 5:00 pm

## 2015-01-22 NOTE — Progress Notes (Signed)
PP CBG 62. Pt began eating her meal at 1556, 6 units coverage administered at that time based on pt's assessment of carbs in her salad. She continued to eat until 1745. Pt given 1 pkg of graham crackers & 1 packet of PB to prevent further decline.

## 2015-01-22 NOTE — Progress Notes (Signed)
Patient ID: [name], female   DOB: 1990-11-03, 24 y.o.   MRN: [id number] Hospital Day: 7  S: Preterm labor symptoms: low back pain, pelvic pressure and cramping  O: Blood pressure 157/82, pulse 93, temperature 98.5 F (36.9 C), temperature source Oral, resp. rate 18, height 5' 7"  (1.702 m), weight 215 lb (97.523 kg), last menstrual period 05/26/2014, SpO2 93 %.   WNU:UVOZDGUY: 140 bpm  Toco: Irregular UC's QIH:KVQQVZDG: Fingertip  A/P- 24 y.o. admitted with: Elevated BP.  Stable on Labetalol.  Continue present management.  Present on Admission:  . Hypertension in pregnancy, antepartum  Pregnancy Complications: hypertension and IDDM  Preterm labor management: Procardia Dating:  80w3dPNL Needed:  none FWB:  good PTL:  stable

## 2015-01-22 NOTE — Progress Notes (Signed)
Inpatient Diabetes Program Recommendations  Diabetes Treatment Program Recommendations  ADA Standards of Care 2016 Diabetes in Pregnancy Target Glucose Ranges:  Fasting: 60 - 90 mg/dL Preprandial: 60 - 161 mg/dL 1 hr postprandial: Less than 140mg /dL (from first bite of meal) 2 hr postprandial: Less than 120 mg/dL (from first bit of meal)   Review of Glycemic Control Noted correction insulin this am given with meal coverage. Have requested that fasting cbg correction novolog insulin be given no later than 8:00 am with the am levemir dose. (They can be mixed or given as separate injections.) The fasting correction insulin must be given within the hour (or less than the hour) the cbg was tested.  Thank you Lenor Coffin, RN, MSN, CDE  Diabetes Inpatient Program Office: [phone] Pager: [phone] 8:00 am to 5:00 pm

## 2015-01-23 ENCOUNTER — Other Ambulatory Visit (HOSPITAL_COMMUNITY): Payer: 59

## 2015-01-23 LAB — CBC WITH DIFFERENTIAL/PLATELET
Basophils Absolute: 0 10*3/uL (ref 0.0–0.1)
Basophils Relative: 0 %
Eosinophils Absolute: 0.1 10*3/uL (ref 0.0–0.7)
Eosinophils Relative: 2 %
HCT: 30.1 % — ABNORMAL LOW (ref 36.0–46.0)
Hemoglobin: 10.1 g/dL — ABNORMAL LOW (ref 12.0–15.0)
Lymphocytes Relative: 36 %
Lymphs Abs: 2 10*3/uL (ref 0.7–4.0)
MCH: 30.5 pg (ref 26.0–34.0)
MCHC: 33.6 g/dL (ref 30.0–36.0)
MCV: 90.9 fL (ref 78.0–100.0)
Monocytes Absolute: 0.4 10*3/uL (ref 0.1–1.0)
Monocytes Relative: 6 %
Neutro Abs: 3.2 10*3/uL (ref 1.7–7.7)
Neutrophils Relative %: 56 %
Platelets: 184 10*3/uL (ref 150–400)
RBC: 3.31 MIL/uL — ABNORMAL LOW (ref 3.87–5.11)
RDW: 12.9 % (ref 11.5–15.5)
WBC: 5.8 10*3/uL (ref 4.0–10.5)

## 2015-01-23 LAB — GLUCOSE, CAPILLARY
Glucose-Capillary: 129 mg/dL — ABNORMAL HIGH (ref 65–99)
Glucose-Capillary: 104 mg/dL — ABNORMAL HIGH (ref 65–99)
Glucose-Capillary: 160 mg/dL — ABNORMAL HIGH (ref 65–99)

## 2015-01-23 LAB — HEPATIC FUNCTION PANEL
ALT: 14 U/L (ref 14–54)
AST: 19 U/L (ref 15–41)
Albumin: 2.1 g/dL — ABNORMAL LOW (ref 3.5–5.0)
Alkaline Phosphatase: 82 U/L (ref 38–126)
Bilirubin, Direct: <0.1 mg/dL — ABNORMAL LOW (ref 0.1–0.5)
Total Bilirubin: 0.4 mg/dL (ref 0.3–1.2)
Total Protein: 6.2 g/dL — ABNORMAL LOW (ref 6.5–8.1)

## 2015-01-23 LAB — PROTEIN / CREATININE RATIO, URINE
Creatinine, Urine: 19 mg/dL
Protein Creatinine Ratio: 0.89 mg/mg{Cre} — ABNORMAL HIGH (ref 0.00–0.15)
Total Protein, Urine: 17 mg/dL

## 2015-01-23 LAB — URIC ACID: Uric Acid, Serum: 6.8 mg/dL — ABNORMAL HIGH (ref 2.3–6.6)

## 2015-01-23 MED ORDER — NIFEDIPINE ER OSMOTIC RELEASE 30 MG PO TB24
30.0000 mg | ORAL_TABLET | Freq: Two times a day (BID) | ORAL | Status: DC
  Administered 2015-01-23 – 2015-01-24 (×3): 30 mg via ORAL
  Filled 2015-01-23 (×3): qty 1

## 2015-01-23 MED ORDER — ONDANSETRON HCL 4 MG/2ML IJ SOLN
4.0000 mg | Freq: Four times a day (QID) | INTRAMUSCULAR | Status: DC | PRN
  Administered 2015-01-23 (×2): 4 mg via INTRAVENOUS
  Filled 2015-01-23 (×4): qty 2

## 2015-01-23 MED ORDER — FERROUS SULFATE 325 (65 FE) MG PO TABS
325.0000 mg | ORAL_TABLET | Freq: Two times a day (BID) | ORAL | Status: DC
  Administered 2015-01-23 – 2015-01-24 (×2): 325 mg via ORAL
  Filled 2015-01-23 (×2): qty 1

## 2015-01-23 MED ORDER — NIFEDIPINE 10 MG PO CAPS
20.0000 mg | ORAL_CAPSULE | Freq: Four times a day (QID) | ORAL | Status: DC
  Administered 2015-01-23: 20 mg via ORAL
  Filled 2015-01-23: qty 2

## 2015-01-23 NOTE — Progress Notes (Addendum)
Patient ID: [name], female   DOB: Apr 02, 1990, 24 y.o.   MRN: [id number]  Hospital Day: 8  S: Preterm labor symptoms: low back pain, pelvic pressure and reports feeling occasional contractions.  Is having nausea and vomiting this AM.  Elevated blood pressures with occasional HA reported.    O: Blood pressure 158/86, pulse 95, temperature 98.4 F (36.9 C), temperature source Oral, resp. rate 18, height 5\' 7"  (1.702 m), weight 215 lb (97.523 kg), last menstrual period 05/26/2014, SpO2 96 %.   XYV:OPFYTWKM: 145 bpm, Variability: Fair (1-6 bpm), Accelerations: Reactive and Decelerations: Variable: mild Toco: Frequency: irregular QKM:MNOTRRNH: Fingertip  A:  24 y.o. admitted with: uncontrolled IDDM, elevated blood pressures and uterine contractions Current N&V.   P: Continue current care  Present on Admission:  . Hypertension in pregnancy, antepartum  Pregnancy Complications: Polyhydraminos, uncontrolled IDDM, PIH  Preterm labor management: bedrest advised, pelvic rest advised and Procardia Dating:  [redacted]w[redacted]d PNL Needed:  PIH work up ordered FWB:  Good PTL:  Stable ROD: Anticipate NSVD closer to term.

## 2015-01-23 NOTE — Progress Notes (Addendum)
Inpatient Diabetes Program Recommendations  Diabetes Treatment Program Recommendations  ADA Standards of Care 2016 Diabetes in Pregnancy Target Glucose Ranges:  Fasting: 60 - 90 mg/dL Preprandial: 60 - 820 mg/dL 1 hr postprandial: Less than 140mg /dL (from first bite of meal) 2 hr postprandial: Less than 120 mg/dL (from first bit of meal)   Review of Glycemic Control Will review glycemic control throughout the day today. Need patient did not get a 2 hr pp dinner until 10:00 pm -  Need to get this patient on a slightly tighter mealtime regimen. Novolog meal coverage needs be given with the meal and correction 2 hrs following the first bite of each meal. Cannot combine meal coverage with correction as correction is to cover the cbg only 2 hr pp. Will follow throughout today to further assess a pattern and potential insulin dose adjustments.   Thank you Lenor Coffin, RN, MSN, CDE  Diabetes Inpatient Program Office: [phone] Pager: [phone] 8:00 am to 5:00 pm

## 2015-01-23 NOTE — Progress Notes (Signed)
Pt ate a late breakfast due to N/V, has snacked on saltine crackers.  Pt states she will order a 2nd meal tray soon for CBG checks.

## 2015-01-24 ENCOUNTER — Inpatient Hospital Stay (HOSPITAL_COMMUNITY): Payer: 59

## 2015-01-24 DIAGNOSIS — Z3490 Encounter for supervision of normal pregnancy, unspecified, unspecified trimester: Secondary | ICD-10-CM

## 2015-01-24 LAB — CBC
HCT: 30.5 % — ABNORMAL LOW (ref 36.0–46.0)
Hemoglobin: 10.1 g/dL — ABNORMAL LOW (ref 12.0–15.0)
MCH: 30.4 pg (ref 26.0–34.0)
MCHC: 33.1 g/dL (ref 30.0–36.0)
MCV: 91.9 fL (ref 78.0–100.0)
Platelets: 172 10*3/uL (ref 150–400)
RBC: 3.32 MIL/uL — ABNORMAL LOW (ref 3.87–5.11)
RDW: 13 % (ref 11.5–15.5)
WBC: 6.2 10*3/uL (ref 4.0–10.5)

## 2015-01-24 LAB — GLUCOSE, CAPILLARY
Glucose-Capillary: 84 mg/dL (ref 65–99)
Glucose-Capillary: 116 mg/dL — ABNORMAL HIGH (ref 65–99)
Glucose-Capillary: 126 mg/dL — ABNORMAL HIGH (ref 65–99)
Glucose-Capillary: 66 mg/dL (ref 65–99)
Glucose-Capillary: 126 mg/dL — ABNORMAL HIGH (ref 65–99)

## 2015-01-24 IMAGING — US US MFM OB LIMITED
1 series · 11 of 11 positions shown · non-contrast
Comparison: none

[Series 1: us mfm ob limited · 11 of 11 slices shown]
[im 1/11]
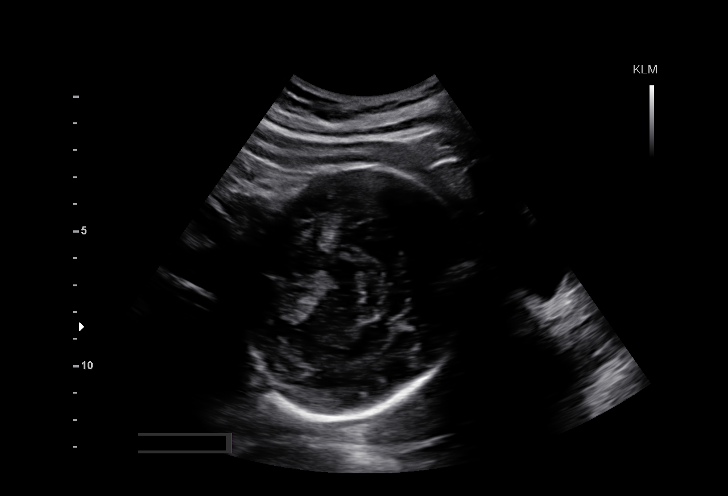
[im 2/11]
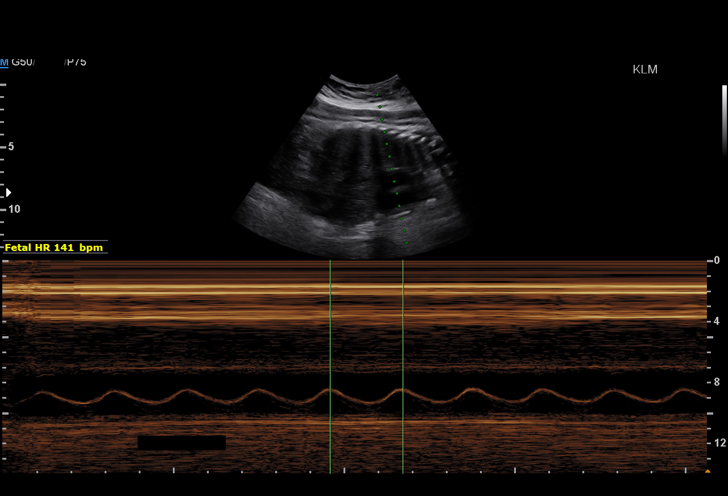
[im 3/11]
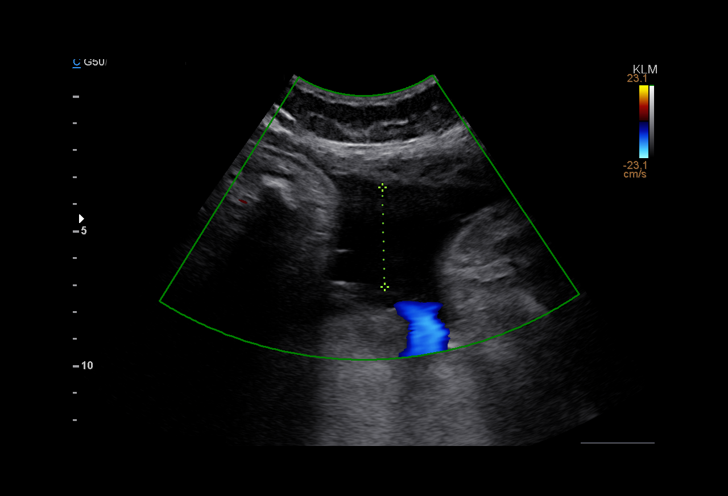
[im 4/11]
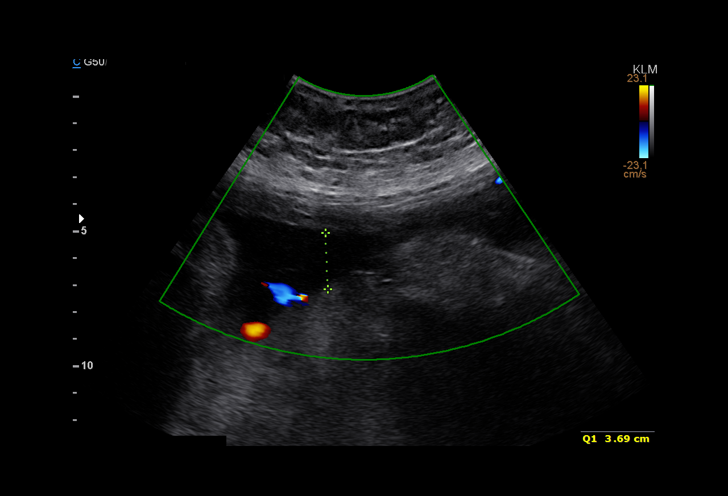
[im 5/11]
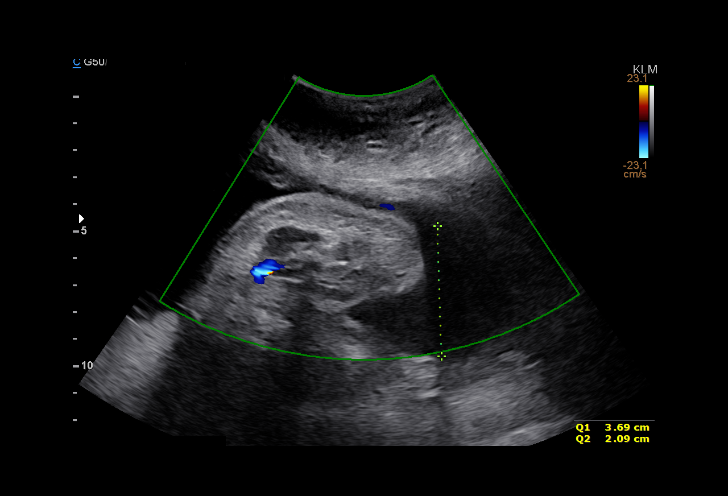
[im 6/11]
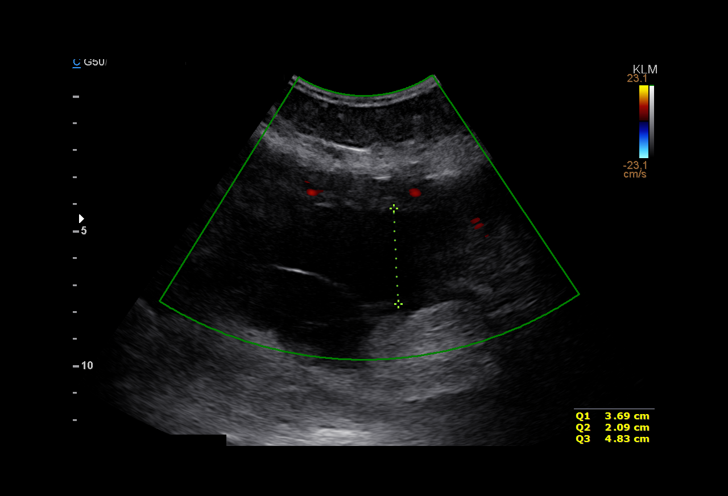
[im 7/11]
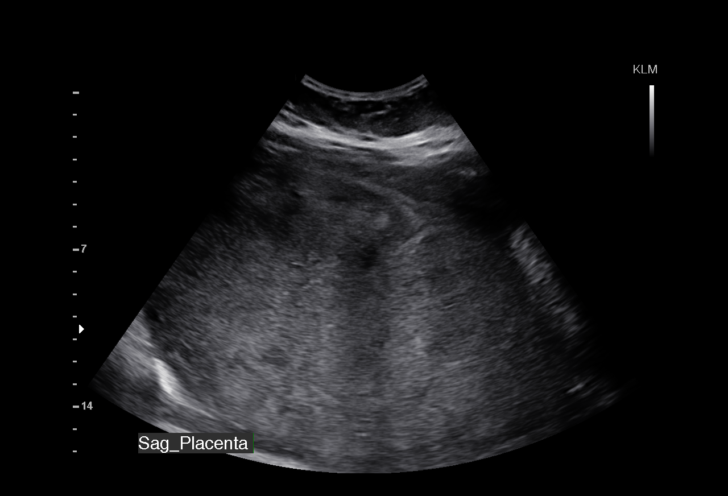
[im 8/11]
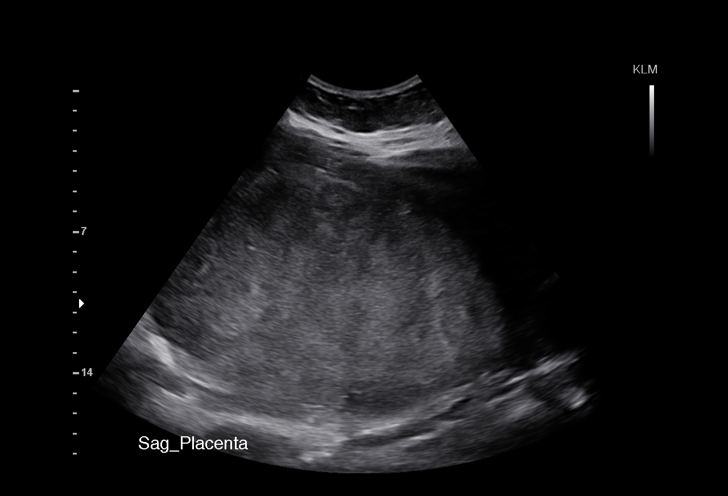
[im 9/11]
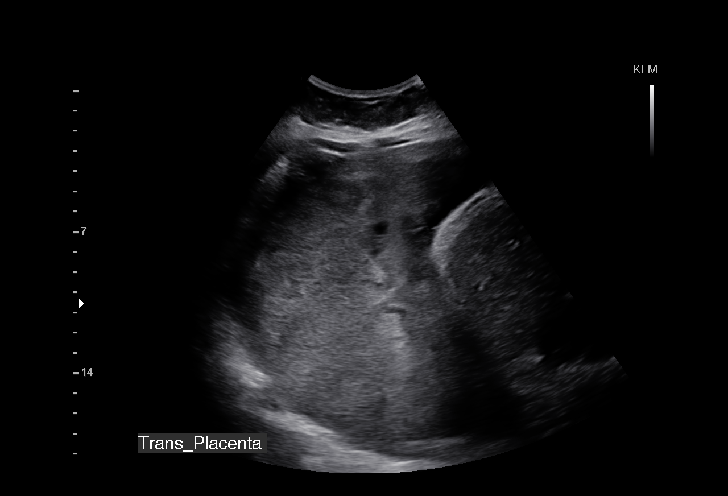
[im 10/11]
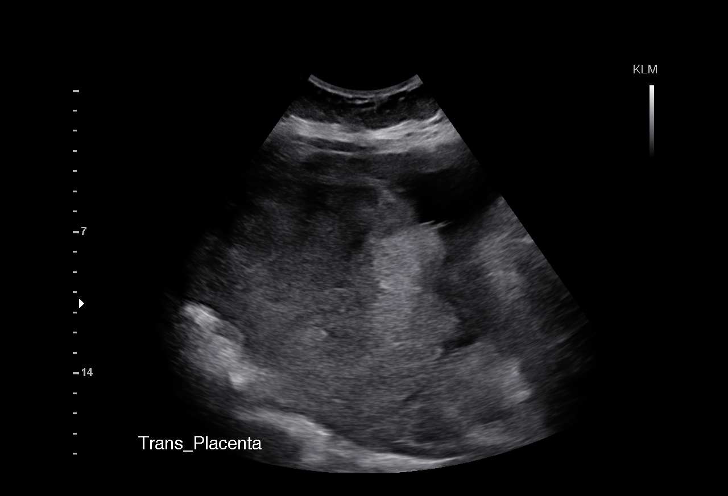
[im 11/11]
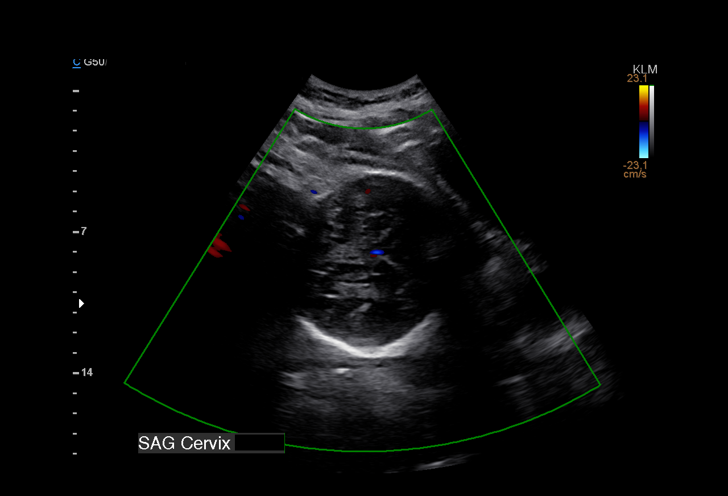

[11 of 11 positions shown; findings below may reference images not displayed]

OBSTETRICS REPORT
(Signed Final [DATE] [DATE])

Name:       ADENYO                        Visit  [DATE] [DATE]
Date:

By:
Service(s) Provided

US MFM OB LIMITED                                      76815.01
Indications

Determine fetal presentation using ultrasound          Z36
Premature rupture of membranes - leaking fluid         [UE]
34 weeks gestation of pregnancy
Polyhydramnios, third trimester, antepartum            [UE]
condition or complication, unspecified fetus
H/O Pre-existing essential hypertension                [UE]
complicating pregnancy, third trimester
Pre-existing diabetes, type 1?, in pregnancy,          [UE]
third trimester - normal fetal ECHO; on insulin;
sees Dr. ADENYO, endocrinologist
Abnormal first trimester screen, DSR [DATE], s/p        [UE]
ADENYO; declined testing, ADENYO
Fetal Evaluation

Num Of             1
Fetuses:
Fetal Heart        141                          bpm
Rate:
Cardiac Activity:  Observed
Presentation:      Cephalic
Placenta:          Posterior, above cervical
os
P. Cord            Previously Visualized
Insertion:

Amniotic Fluid
AFI FV:      Subjectively within normal limits
AFI Sum:     14.15    cm      50  %Tile     Larg Pckt:    4.83   cm
RUQ:   3.69    cm    RLQ:   3.54    cm   LUQ:    2.09    cm   LLQ:    4.83   cm
Gestational Age

LMP:           34w 5d        Date:  [DATE]                  EDD:   [DATE]
Best:          34w 5d    Det. By:   LMP  ([DATE])           EDD:   [DATE]
Cervix Uterus Adnexa

Cervix:       Not visualized (advanced GA >[UE])
Impression

Single IUP at 34w 5d
Limited ultrasound performed for presentation, suspected
PROM
Cephalic presentation
Posterior placenta
Normal amniotic fluid volume (AFI 14.15 cm)
Recommendations

Follow-up ultrasounds as clinically indicated.

## 2015-01-24 MED ORDER — ONDANSETRON HCL 4 MG/2ML IJ SOLN
4.0000 mg | Freq: Four times a day (QID) | INTRAMUSCULAR | Status: DC | PRN
  Administered 2015-01-25: 4 mg via INTRAVENOUS

## 2015-01-24 MED ORDER — AZITHROMYCIN 250 MG PO TABS
250.0000 mg | ORAL_TABLET | Freq: Every day | ORAL | Status: DC
  Filled 2015-01-24: qty 1

## 2015-01-24 MED ORDER — FENTANYL CITRATE (PF) 100 MCG/2ML IJ SOLN: 100.0000 ug | INTRAMUSCULAR | Status: DC | PRN

## 2015-01-24 MED ORDER — PROMETHAZINE HCL 25 MG/ML IJ SOLN
25.0000 mg | Freq: Four times a day (QID) | INTRAMUSCULAR | Status: DC | PRN
  Filled 2015-01-24: qty 1

## 2015-01-24 MED ORDER — NALBUPHINE HCL 10 MG/ML IJ SOLN
10.0000 mg | Freq: Four times a day (QID) | INTRAMUSCULAR | Status: DC | PRN
Start: 2015-01-24 — End: 2015-01-26
  Filled 2015-01-24: qty 1

## 2015-01-24 MED ORDER — MISOPROSTOL 50MCG HALF TABLET
50.0000 ug | ORAL_TABLET | ORAL | Status: DC
  Administered 2015-01-24 – 2015-01-25 (×4): 50 ug via ORAL
  Filled 2015-01-24 (×4): qty 0.5

## 2015-01-24 MED ORDER — FLEET ENEMA 7-19 GM/118ML RE ENEM: 1.0000 | ENEMA | RECTAL | Status: DC | PRN

## 2015-01-24 MED ORDER — ACETAMINOPHEN 325 MG PO TABS: 650.0000 mg | ORAL_TABLET | ORAL | Status: DC | PRN

## 2015-01-24 MED ORDER — LACTATED RINGERS IV SOLN
500.0000 mL | INTRAVENOUS | Status: DC | PRN
  Administered 2015-01-25 (×2): 500 mL via INTRAVENOUS

## 2015-01-24 MED ORDER — LACTATED RINGERS IV SOLN
INTRAVENOUS | Status: DC
  Administered 2015-01-24 – 2015-01-26 (×5): via INTRAVENOUS

## 2015-01-24 MED ORDER — OXYCODONE-ACETAMINOPHEN 5-325 MG PO TABS: 1.0000 | ORAL_TABLET | ORAL | Status: DC | PRN

## 2015-01-24 MED ORDER — LIDOCAINE HCL (PF) 1 % IJ SOLN
30.0000 mL | INTRAMUSCULAR | Status: AC | PRN
  Administered 2015-01-26: 30 mL via SUBCUTANEOUS
  Filled 2015-01-24 (×2): qty 30

## 2015-01-24 MED ORDER — CITRIC ACID-SODIUM CITRATE 334-500 MG/5ML PO SOLN
30.0000 mL | ORAL | Status: DC | PRN
  Administered 2015-01-25: 30 mL via ORAL
  Filled 2015-01-24: qty 15

## 2015-01-24 MED ORDER — SODIUM CHLORIDE 0.9 % IV SOLN
2.0000 g | Freq: Four times a day (QID) | INTRAVENOUS | Status: DC
  Administered 2015-01-24 – 2015-01-25 (×6): 2 g via INTRAVENOUS
  Filled 2015-01-24 (×9): qty 2000

## 2015-01-24 MED ORDER — OXYTOCIN BOLUS FROM INFUSION: 500.0000 mL | INTRAVENOUS | Status: DC

## 2015-01-24 MED ORDER — OXYCODONE-ACETAMINOPHEN 5-325 MG PO TABS: 2.0000 | ORAL_TABLET | ORAL | Status: DC | PRN

## 2015-01-24 MED ORDER — OXYTOCIN 40 UNITS IN LACTATED RINGERS INFUSION - SIMPLE MED
62.5000 mL/h | INTRAVENOUS | Status: DC
  Filled 2015-01-24: qty 1000

## 2015-01-24 MED ORDER — AZITHROMYCIN 250 MG PO TABS
500.0000 mg | ORAL_TABLET | Freq: Once | ORAL | Status: AC
  Administered 2015-01-24: 500 mg via ORAL
  Filled 2015-01-24: qty 2

## 2015-01-24 MED ORDER — NALBUPHINE HCL 10 MG/ML IJ SOLN: 10.0000 mg | Freq: Four times a day (QID) | INTRAMUSCULAR | Status: DC | PRN

## 2015-01-24 NOTE — Progress Notes (Signed)
Inpatient Diabetes Program Recommendations  AACE/ADA: New Consensus Statement on Inpatient Glycemic Control (2015)  Target Ranges:  Prepandial:   less than 140 mg/dL      Peak postprandial:   less than 180 mg/dL (1-2 hours)      Critically ill patients:  140 - 180 mg/dL   Review of Glycemic Control Spoke with RN taking care of patient today who states patient is to go for a c/section this am. If this is the case, I have requested the patient to be started on the IV insulin drip per glucostabilizer asap per the Diabetic pregnant patient order set. I am glad to assist with orders if needed. Requested that if the patient is to go to the OR this am, to not give any subcutaneous insulin this am including correction and levemir. However, if she is not to go to the OR we need to continue the levemir and correction.  Thank you Lenor Coffin, RN, MSN, CDE  Diabetes Inpatient Program Office: [phone] Pager: [phone] 8:00 am to 5:00 pm

## 2015-01-25 ENCOUNTER — Inpatient Hospital Stay (HOSPITAL_COMMUNITY): Payer: 59 | Admitting: Anesthesiology

## 2015-01-25 ENCOUNTER — Ambulatory Visit (HOSPITAL_COMMUNITY): Payer: 59

## 2015-01-25 ENCOUNTER — Encounter (HOSPITAL_COMMUNITY): Payer: Self-pay | Admitting: Anesthesiology

## 2015-01-25 LAB — GLUCOSE, CAPILLARY
Glucose-Capillary: 94 mg/dL (ref 65–99)
Glucose-Capillary: 82 mg/dL (ref 65–99)
Glucose-Capillary: 79 mg/dL (ref 65–99)
Glucose-Capillary: 66 mg/dL (ref 65–99)
Glucose-Capillary: 62 mg/dL — ABNORMAL LOW (ref 65–99)
Glucose-Capillary: 66 mg/dL (ref 65–99)
Glucose-Capillary: 67 mg/dL (ref 65–99)
Glucose-Capillary: 67 mg/dL (ref 65–99)
Glucose-Capillary: 109 mg/dL — ABNORMAL HIGH (ref 65–99)
Glucose-Capillary: 94 mg/dL (ref 65–99)
Glucose-Capillary: 112 mg/dL — ABNORMAL HIGH (ref 65–99)
Glucose-Capillary: 102 mg/dL — ABNORMAL HIGH (ref 65–99)
Glucose-Capillary: 94 mg/dL (ref 65–99)
Glucose-Capillary: 93 mg/dL (ref 65–99)
Glucose-Capillary: 85 mg/dL (ref 65–99)
Glucose-Capillary: 92 mg/dL (ref 65–99)
Glucose-Capillary: 94 mg/dL (ref 65–99)
Glucose-Capillary: 115 mg/dL — ABNORMAL HIGH (ref 65–99)
Glucose-Capillary: 108 mg/dL — ABNORMAL HIGH (ref 65–99)
Glucose-Capillary: 130 mg/dL — ABNORMAL HIGH (ref 65–99)

## 2015-01-25 LAB — CBC
HCT: 31.4 % — ABNORMAL LOW (ref 36.0–46.0)
Hemoglobin: 10.6 g/dL — ABNORMAL LOW (ref 12.0–15.0)
MCH: 30.9 pg (ref 26.0–34.0)
MCHC: 33.8 g/dL (ref 30.0–36.0)
MCV: 91.5 fL (ref 78.0–100.0)
Platelets: 177 10*3/uL (ref 150–400)
RBC: 3.43 MIL/uL — ABNORMAL LOW (ref 3.87–5.11)
RDW: 12.9 % (ref 11.5–15.5)
WBC: 6.7 10*3/uL (ref 4.0–10.5)

## 2015-01-25 LAB — RPR: RPR Ser Ql: NONREACTIVE

## 2015-01-25 MED ORDER — DEXTROSE 5 % IV SOLN
250.0000 mg | INTRAVENOUS | Status: DC
  Filled 2015-01-25: qty 250

## 2015-01-25 MED ORDER — OXYTOCIN 40 UNITS IN LACTATED RINGERS INFUSION - SIMPLE MED: 1.0000 m[IU]/min | INTRAVENOUS | Status: DC

## 2015-01-25 MED ORDER — FENTANYL 2.5 MCG/ML BUPIVACAINE 1/10 % EPIDURAL INFUSION (WH - ANES)
14.0000 mL/h | INTRAMUSCULAR | Status: DC | PRN
  Administered 2015-01-25 (×3): 14 mL/h via EPIDURAL
  Filled 2015-01-25 (×3): qty 125

## 2015-01-25 MED ORDER — LABETALOL HCL 5 MG/ML IV SOLN
20.0000 mg | Freq: Once | INTRAVENOUS | Status: AC
  Administered 2015-01-25: 20 mg via INTRAVENOUS
  Filled 2015-01-25: qty 4

## 2015-01-25 MED ORDER — TERBUTALINE SULFATE 1 MG/ML IJ SOLN: 0.2500 mg | Freq: Once | INTRAMUSCULAR | Status: DC | PRN

## 2015-01-25 MED ORDER — LABETALOL HCL 5 MG/ML IV SOLN
20.0000 mg | INTRAVENOUS | Status: DC | PRN
  Administered 2015-01-25 (×2): 20 mg via INTRAVENOUS
  Filled 2015-01-25 (×2): qty 4

## 2015-01-25 MED ORDER — SODIUM CHLORIDE 0.9 % IV SOLN
INTRAVENOUS | Status: DC
  Administered 2015-01-25: 0.3 [IU]/h via INTRAVENOUS
  Administered 2015-01-26: 0.7 [IU]/h via INTRAVENOUS
  Filled 2015-01-25 (×2): qty 2.5

## 2015-01-25 MED ORDER — PHENYLEPHRINE 40 MCG/ML (10ML) SYRINGE FOR IV PUSH (FOR BLOOD PRESSURE SUPPORT)
80.0000 ug | PREFILLED_SYRINGE | INTRAVENOUS | Status: DC | PRN
  Filled 2015-01-25: qty 20

## 2015-01-25 MED ORDER — OXYTOCIN 40 UNITS IN LACTATED RINGERS INFUSION - SIMPLE MED
1.0000 m[IU]/min | INTRAVENOUS | Status: DC
  Administered 2015-01-25: 1 m[IU]/min via INTRAVENOUS

## 2015-01-25 MED ORDER — DIPHENHYDRAMINE HCL 50 MG/ML IJ SOLN: 12.5000 mg | INTRAMUSCULAR | Status: DC | PRN

## 2015-01-25 MED ORDER — LIDOCAINE HCL (PF) 1 % IJ SOLN
INTRAMUSCULAR | Status: DC | PRN
  Administered 2015-01-25 (×2): 4 mL via EPIDURAL

## 2015-01-25 MED ORDER — DEXTROSE 5 % IV SOLN: 2.0000 g | Freq: Four times a day (QID) | INTRAVENOUS | Status: DC

## 2015-01-25 MED ORDER — AQUAPHOR EX OINT
TOPICAL_OINTMENT | CUTANEOUS | Status: DC | PRN
  Administered 2015-01-25: 19:00:00 via TOPICAL
  Filled 2015-01-25: qty 50

## 2015-01-25 MED ORDER — DEXTROSE IN LACTATED RINGERS 5 % IV SOLN
INTRAVENOUS | Status: DC
  Administered 2015-01-26: via INTRAVENOUS

## 2015-01-25 MED ORDER — DEXTROSE 5 % IV SOLN
500.0000 mg | INTRAVENOUS | Status: DC
  Administered 2015-01-25: 500 mg via INTRAVENOUS
  Filled 2015-01-25 (×2): qty 500

## 2015-01-25 MED ORDER — EPHEDRINE 5 MG/ML INJ: 10.0000 mg | INTRAVENOUS | Status: DC | PRN

## 2015-01-25 NOTE — Progress Notes (Signed)
[name] is a 24 y.o. G1P0 at [redacted]w[redacted]d by LMP admitted for elevated BP and uncontrolled IDDM.  Subjective:   Objective: BP 160/88 mmHg  Pulse 91  Temp(Src) 98.6 F (37 C) (Oral)  Resp 18  Ht 5\' 7"  (1.702 m)  Wt 215 lb (97.523 kg)  BMI 33.67 kg/m2  SpO2 96%  LMP 05/26/2014 I/O last 3 completed shifts: In: 1835 [P.O.:460; I.V.:1375] Out: 6400 [Urine:6400]    FHT:  FHR: 140 bpm, variability: moderate,  accelerations:  Present,  decelerations:  Absent UC:   regular, every 5-7 minutes SVE:   Dilation: 5 Effacement (%): 80 Station: -2 Exam by:: LCarpenter,RN  Labs: Lab Results  Component Value Date   WBC 6.7 01/25/2015   HGB 10.6* 01/25/2015   HCT 31.4* 01/25/2015   MCV 91.5 01/25/2015   PLT 177 01/25/2015    Assessment / Plan: Augmentation of labor, progressing well  Labor: Progressing normally Preeclampsia:  no signs or symptoms of toxicity Fetal Wellbeing:  Category I Pain Control:  Epidural I/D:  n/a Anticipated MOD:  NSVD  Gerald Honea A 01/25/2015, 4:58 AM

## 2015-01-25 NOTE — Anesthesia Procedure Notes (Signed)
Epidural Patient location during procedure: OB Start time: 01/25/2015 3:11 AM  Staffing Anesthesiologist: Mal Amabile Performed by: anesthesiologist   Preanesthetic Checklist Completed: patient identified, site marked, surgical consent, pre-op evaluation, timeout performed, IV checked, risks and benefits discussed and monitors and equipment checked  Epidural Patient position: sitting Prep: site prepped and draped and DuraPrep Patient monitoring: continuous pulse ox and blood pressure Approach: midline Location: L3-L4 Injection technique: LOR air  Needle:  Needle type: Tuohy  Needle gauge: 17 G Needle length: 9 cm and 9 Needle insertion depth: 7 cm Catheter type: closed end flexible Catheter size: 19 Gauge Catheter at skin depth: 12 cm Test dose: negative and Other  Assessment Events: blood not aspirated, injection not painful, no injection resistance, negative IV test and no paresthesia  Additional Notes Patient identified. Risks and benefits discussed including failed block, incomplete  Pain control, post dural puncture headache, nerve damage, paralysis, blood pressure Changes, nausea, vomiting, reactions to medications-both toxic and allergic and post Partum back pain. All questions were answered. Patient expressed understanding and wished to proceed. Sterile technique was used throughout procedure. Epidural site was Dressed with sterile barrier dressing. No paresthesias, signs of intravascular injection Or signs of intrathecal spread were encountered.  Patient was more comfortable after the epidural was dosed. Please see RN's note for documentation of vital signs and FHR which are stable.

## 2015-01-25 NOTE — Anesthesia Preprocedure Evaluation (Addendum)
Anesthesia Evaluation  Patient identified by MRN, date of birth, ID band Patient awake    Reviewed: Allergy & Precautions, NPO status , Patient's Chart, lab work & pertinent test results, reviewed documented beta blocker date and time   Airway Mallampati: III  TM Distance: >3 FB Neck ROM: Full    Dental  (+) Poor Dentition   Pulmonary shortness of breath and with exertion,    Pulmonary exam normal breath sounds clear to auscultation       Cardiovascular hypertension, Pt. on medications and Pt. on home beta blockers  Rhythm:Regular Rate:Tachycardia     Neuro/Psych Anxiety Autonomic dysfunction  Neuromuscular disease    GI/Hepatic GERD  Medicated,Diabetic gastroparesis   Endo/Other  diabetes, Poorly Controlled, Type 1, Insulin DependentObesity  Renal/GU   negative genitourinary   Musculoskeletal  (+) Fibromyalgia -  Abdominal (+) + obese,   Peds  Hematology   Anesthesia Other Findings   Reproductive/Obstetrics (+) Pregnancy 34 weeks SROM                            Anesthesia Physical Anesthesia Plan  ASA: III  Anesthesia Plan: Epidural   Post-op Pain Management:    Induction:   Airway Management Planned: Natural Airway  Additional Equipment:   Intra-op Plan:   Post-operative Plan:   Informed Consent: I have reviewed the patients History and Physical, chart, labs and discussed the procedure including the risks, benefits and alternatives for the proposed anesthesia with the patient or authorized representative who has indicated his/her understanding and acceptance.     Plan Discussed with: Anesthesiologist  Anesthesia Plan Comments:         Anesthesia Quick Evaluation

## 2015-01-26 ENCOUNTER — Encounter (HOSPITAL_COMMUNITY): Payer: Self-pay | Admitting: Neonatology

## 2015-01-26 ENCOUNTER — Ambulatory Visit (HOSPITAL_COMMUNITY): Admission: RE | Admit: 2015-01-26 | Payer: 59 | Source: Ambulatory Visit

## 2015-01-26 ENCOUNTER — Other Ambulatory Visit (HOSPITAL_COMMUNITY): Payer: 59

## 2015-01-26 LAB — CBC
HCT: 25.8 % — ABNORMAL LOW (ref 36.0–46.0)
Hemoglobin: 8.8 g/dL — ABNORMAL LOW (ref 12.0–15.0)
MCH: 31 pg (ref 26.0–34.0)
MCHC: 34.1 g/dL (ref 30.0–36.0)
MCV: 90.8 fL (ref 78.0–100.0)
Platelets: 155 10*3/uL (ref 150–400)
RBC: 2.84 MIL/uL — ABNORMAL LOW (ref 3.87–5.11)
RDW: 12.9 % (ref 11.5–15.5)
WBC: 10.1 10*3/uL (ref 4.0–10.5)

## 2015-01-26 LAB — COMPREHENSIVE METABOLIC PANEL
ALT: 14 U/L (ref 14–54)
AST: 25 U/L (ref 15–41)
Albumin: 1.8 g/dL — ABNORMAL LOW (ref 3.5–5.0)
Alkaline Phosphatase: 75 U/L (ref 38–126)
Anion gap: 8 (ref 5–15)
BUN: 15 mg/dL (ref 6–20)
CO2: 20 mmol/L — ABNORMAL LOW (ref 22–32)
Calcium: 7.2 mg/dL — ABNORMAL LOW (ref 8.9–10.3)
Chloride: 107 mmol/L (ref 101–111)
Creatinine, Ser: 0.68 mg/dL (ref 0.44–1.00)
GFR calc Af Amer: >60 mL/min (ref >60)
GFR calc non Af Amer: >60 mL/min (ref >60)
Glucose, Bld: 134 mg/dL — ABNORMAL HIGH (ref 65–99)
Potassium: 3.6 mmol/L (ref 3.5–5.1)
Sodium: 135 mmol/L (ref 135–145)
Total Bilirubin: 0.9 mg/dL (ref 0.3–1.2)
Total Protein: 4.8 g/dL — ABNORMAL LOW (ref 6.5–8.1)

## 2015-01-26 LAB — GLUCOSE, CAPILLARY
Glucose-Capillary: 100 mg/dL — ABNORMAL HIGH (ref 65–99)
Glucose-Capillary: 102 mg/dL — ABNORMAL HIGH (ref 65–99)
Glucose-Capillary: 103 mg/dL — ABNORMAL HIGH (ref 65–99)
Glucose-Capillary: 107 mg/dL — ABNORMAL HIGH (ref 65–99)
Glucose-Capillary: 112 mg/dL — ABNORMAL HIGH (ref 65–99)
Glucose-Capillary: 114 mg/dL — ABNORMAL HIGH (ref 65–99)
Glucose-Capillary: 116 mg/dL — ABNORMAL HIGH (ref 65–99)
Glucose-Capillary: 119 mg/dL — ABNORMAL HIGH (ref 65–99)
Glucose-Capillary: 120 mg/dL — ABNORMAL HIGH (ref 65–99)
Glucose-Capillary: 132 mg/dL — ABNORMAL HIGH (ref 65–99)
Glucose-Capillary: 136 mg/dL — ABNORMAL HIGH (ref 65–99)
Glucose-Capillary: 138 mg/dL — ABNORMAL HIGH (ref 65–99)
Glucose-Capillary: 150 mg/dL — ABNORMAL HIGH (ref 65–99)
Glucose-Capillary: 151 mg/dL — ABNORMAL HIGH (ref 65–99)
Glucose-Capillary: 155 mg/dL — ABNORMAL HIGH (ref 65–99)
Glucose-Capillary: 167 mg/dL — ABNORMAL HIGH (ref 65–99)
Glucose-Capillary: 182 mg/dL — ABNORMAL HIGH (ref 65–99)
Glucose-Capillary: 207 mg/dL — ABNORMAL HIGH (ref 65–99)

## 2015-01-26 LAB — C DIFFICILE QUICK SCREEN W PCR REFLEX
C Diff antigen: NEGATIVE
C Diff interpretation: NEGATIVE
C Diff toxin: NEGATIVE

## 2015-01-26 LAB — TYPE AND SCREEN
ABO/RH(D): O POS
Antibody Screen: NEGATIVE

## 2015-01-26 MED ORDER — OXYCODONE-ACETAMINOPHEN 5-325 MG PO TABS
2.0000 | ORAL_TABLET | ORAL | Status: DC | PRN
  Administered 2015-01-26 – 2015-01-28 (×2): 2 via ORAL
  Filled 2015-01-26 (×2): qty 2

## 2015-01-26 MED ORDER — ONDANSETRON HCL 4 MG PO TABS: 4.0000 mg | ORAL_TABLET | ORAL | Status: DC | PRN

## 2015-01-26 MED ORDER — DIPHENHYDRAMINE HCL 25 MG PO CAPS: 25.0000 mg | ORAL_CAPSULE | Freq: Four times a day (QID) | ORAL | Status: DC | PRN

## 2015-01-26 MED ORDER — SENNOSIDES-DOCUSATE SODIUM 8.6-50 MG PO TABS
2.0000 | ORAL_TABLET | ORAL | Status: DC
  Administered 2015-01-26 – 2015-01-28 (×3): 2 via ORAL
  Filled 2015-01-26 (×3): qty 2

## 2015-01-26 MED ORDER — PRENATAL MULTIVITAMIN CH
1.0000 | ORAL_TABLET | Freq: Every day | ORAL | Status: DC
  Administered 2015-01-26 – 2015-01-29 (×4): 1 via ORAL
  Filled 2015-01-26 (×4): qty 1

## 2015-01-26 MED ORDER — SIMETHICONE 80 MG PO CHEW
80.0000 mg | CHEWABLE_TABLET | ORAL | Status: DC | PRN
  Administered 2015-01-28: 80 mg via ORAL

## 2015-01-26 MED ORDER — WITCH HAZEL-GLYCERIN EX PADS: 1.0000 "application " | MEDICATED_PAD | CUTANEOUS | Status: DC | PRN

## 2015-01-26 MED ORDER — ACETAMINOPHEN 325 MG PO TABS: 650.0000 mg | ORAL_TABLET | ORAL | Status: DC | PRN

## 2015-01-26 MED ORDER — MAGNESIUM SULFATE BOLUS VIA INFUSION
4.0000 g | Freq: Once | INTRAVENOUS | Status: AC
  Administered 2015-01-26: 4 g via INTRAVENOUS
  Filled 2015-01-26: qty 500

## 2015-01-26 MED ORDER — INSULIN DETEMIR 100 UNIT/ML ~~LOC~~ SOLN
14.0000 [IU] | Freq: Two times a day (BID) | SUBCUTANEOUS | Status: DC
  Administered 2015-01-26 – 2015-01-28 (×5): 14 [IU] via SUBCUTANEOUS
  Filled 2015-01-26 (×10): qty 0.14

## 2015-01-26 MED ORDER — HYDRALAZINE HCL 20 MG/ML IJ SOLN
10.0000 mg | Freq: Once | INTRAMUSCULAR | Status: DC | PRN
  Filled 2015-01-26: qty 1

## 2015-01-26 MED ORDER — ONDANSETRON HCL 4 MG/2ML IJ SOLN: 4.0000 mg | INTRAMUSCULAR | Status: DC | PRN

## 2015-01-26 MED ORDER — OXYTOCIN 40 UNITS IN LACTATED RINGERS INFUSION - SIMPLE MED: 62.5000 mL/h | INTRAVENOUS | Status: DC | PRN

## 2015-01-26 MED ORDER — INSULIN ASPART PROT & ASPART (70-30 MIX) 100 UNIT/ML ~~LOC~~ SUSP: 2.0000 [IU] | Freq: Two times a day (BID) | SUBCUTANEOUS | Status: DC

## 2015-01-26 MED ORDER — ZOLPIDEM TARTRATE 5 MG PO TABS: 5.0000 mg | ORAL_TABLET | Freq: Every evening | ORAL | Status: DC | PRN

## 2015-01-26 MED ORDER — LACTATED RINGERS IV SOLN
INTRAVENOUS | Status: DC
  Administered 2015-01-26: 06:00:00 via INTRAVENOUS

## 2015-01-26 MED ORDER — BENZOCAINE-MENTHOL 20-0.5 % EX AERO
1.0000 "application " | INHALATION_SPRAY | CUTANEOUS | Status: DC | PRN
  Administered 2015-01-26: 1 via TOPICAL
  Filled 2015-01-26 (×2): qty 56

## 2015-01-26 MED ORDER — INSULIN ASPART 100 UNIT/ML ~~LOC~~ SOLN
0.0000 [IU] | SUBCUTANEOUS | Status: DC
  Administered 2015-01-26: 4 [IU] via SUBCUTANEOUS
  Administered 2015-01-26 (×2): 2 [IU] via SUBCUTANEOUS
  Administered 2015-01-27: 4 [IU] via SUBCUTANEOUS

## 2015-01-26 MED ORDER — LOPERAMIDE HCL 2 MG PO CAPS
2.0000 mg | ORAL_CAPSULE | ORAL | Status: DC | PRN
  Filled 2015-01-26: qty 1

## 2015-01-26 MED ORDER — MAGNESIUM SULFATE BOLUS VIA INFUSION
6.0000 g | Freq: Once | INTRAVENOUS | Status: DC
Start: 2015-01-26 — End: 2015-01-26

## 2015-01-26 MED ORDER — LABETALOL HCL 5 MG/ML IV SOLN
20.0000 mg | INTRAVENOUS | Status: AC | PRN
  Administered 2015-01-28: 20 mg via INTRAVENOUS
  Administered 2015-01-29: 80 mg via INTRAVENOUS
  Administered 2015-01-29: 40 mg via INTRAVENOUS
  Filled 2015-01-26: qty 8
  Filled 2015-01-26: qty 16
  Filled 2015-01-26: qty 4

## 2015-01-26 MED ORDER — TETANUS-DIPHTH-ACELL PERTUSSIS 5-2.5-18.5 LF-MCG/0.5 IM SUSP
0.5000 mL | Freq: Once | INTRAMUSCULAR | Status: AC
  Administered 2015-01-27: 0.5 mL via INTRAMUSCULAR
  Filled 2015-01-26 (×2): qty 0.5

## 2015-01-26 MED ORDER — OXYCODONE-ACETAMINOPHEN 5-325 MG PO TABS
1.0000 | ORAL_TABLET | ORAL | Status: DC | PRN
  Administered 2015-01-26 – 2015-01-28 (×2): 1 via ORAL
  Filled 2015-01-26 (×2): qty 1

## 2015-01-26 MED ORDER — MAGNESIUM SULFATE 4 GM/100ML IV SOLN
4.0000 g | Freq: Once | INTRAVENOUS | Status: DC
  Filled 2015-01-26: qty 100

## 2015-01-26 MED ORDER — LOPERAMIDE HCL 2 MG PO CAPS
4.0000 mg | ORAL_CAPSULE | Freq: Once | ORAL | Status: AC
  Administered 2015-01-26: 4 mg via ORAL
  Filled 2015-01-26: qty 2

## 2015-01-26 MED ORDER — MAGNESIUM SULFATE 50 % IJ SOLN
2.0000 g/h | INTRAVENOUS | Status: DC
  Administered 2015-01-26: 2 g/h via INTRAVENOUS
  Filled 2015-01-26 (×2): qty 80

## 2015-01-26 MED ORDER — IBUPROFEN 600 MG PO TABS
600.0000 mg | ORAL_TABLET | Freq: Four times a day (QID) | ORAL | Status: DC
  Administered 2015-01-26 – 2015-01-29 (×14): 600 mg via ORAL
  Filled 2015-01-26 (×14): qty 1

## 2015-01-26 MED ORDER — DIBUCAINE 1 % RE OINT
1.0000 "application " | TOPICAL_OINTMENT | RECTAL | Status: DC | PRN
  Filled 2015-01-26: qty 28

## 2015-01-26 MED ORDER — LANOLIN HYDROUS EX OINT
TOPICAL_OINTMENT | CUTANEOUS | Status: DC | PRN
Start: 2015-01-26 — End: 2015-01-29

## 2015-01-26 MED ORDER — SODIUM CHLORIDE 0.9 % IV SOLN
INTRAVENOUS | Status: DC | PRN
  Administered 2015-01-26: 6.4 [IU]/h via INTRAVENOUS
  Filled 2015-01-26: qty 2.5

## 2015-01-26 MED ORDER — DEXTROSE IN LACTATED RINGERS 5 % IV SOLN: INTRAVENOUS | Status: DC

## 2015-01-26 MED ORDER — INSULIN REGULAR BOLUS VIA INFUSION
0.0000 [IU] | Freq: Three times a day (TID) | INTRAVENOUS | Status: DC | PRN
  Filled 2015-01-26: qty 10

## 2015-01-26 MED ORDER — GLUCAGON HCL RDNA (DIAGNOSTIC) 1 MG IJ SOLR
1.0000 mg | Freq: Once | INTRAMUSCULAR | Status: DC | PRN
  Filled 2015-01-26: qty 1

## 2015-01-26 NOTE — Lactation Note (Signed)
This note was copied from the chart of Kathryn Yeily Kocur. Lactation Consultation Note  Patient Name: [name] [id number] Date: 01/26/2015 Reason for consult: Initial assessment  Initial Consult with first time mom of 15 hour old infant born at [redacted] week gestation. Infant is > 6 pounds and in NICU for Blood Glucose stabilization. Mom is in Antenatal on MgSO4. Mom is awake and alert and is receptive to teaching. Dad and Maternal aunt at bedside also. Discussed Supply and demand, milk coming to volume,  and LPT infant behavior. LPT infant handout given. Advised mom to pump q 2-3 hours for 15 minutes with DEBP on preemie setting with one 4-5 hour stretch at night for resting. Followed by hand expression. Mom reports the nurse did assist her with hand expression after delivery, Mom's RN was told that mom may need assistance with Hand Expression later. DEBP set up and instructions given for set up, taking apart, cleaning, and putting pump back together. Gave mom Providing Milk for your NICU handout and LC Brochure. Informed of IP services, OP services and Support Groups. Mom is to order a DEBP from insurance company. Made aware that Hospital Grade DEBP may still be needed at D/C if her pump is not in. Enc parents to call with questions/concerns or when infant ready to go to breast. Dad asked if milk is ok for infant to receive since mom on MgSO4 and Insulin, informed tham that according to Bobbye Morton, both medications are safe to continue BF. Mom was given colostrum collection containers and milk stickers, dad to ask for labels when he visits NICU later. Parents and aunt voiced understanding to all teaching.  Maternal Data Formula Feeding for Exclusion: No Does the patient have breastfeeding experience prior to this delivery?: No  Feeding Feeding Type: Formula Nipple Type: Slow - flow Length of feed: 15 min  LATCH Score/Interventions                      Lactation Tools  Discussed/Used WIC Program: No Pump Review: Setup, frequency, and cleaning;Milk Storage (Taught to mom, dad and mom's sister) Initiated by:: Noralee Stain, RN, IBCLC Date initiated:: 01/26/15   Consult Status Consult Status: Follow-up Date: 01/27/15 Follow-up type: In-patient    [name] 01/26/2015, 3:48 PM

## 2015-01-26 NOTE — Progress Notes (Signed)
Inpatient Diabetes Program Recommendations  AACE/ADA: New Consensus Statement on Inpatient Glycemic Control (2015)  Target Ranges:  Prepandial:   less than 140 mg/dL      Peak postprandial:   less than 180 mg/dL (1-2 hours)      Critically ill patients:  140 - 180 mg/dL   Post Delivery DM type 1 patient of Dr. Daune Perch. Due to the patient being type 1 DM, I would recommend that the attending Call Dr. Jiles Crocker office [phone]), MD to MD for medication recommendations since Dr. Sharl Ma does not have EPIC access. If the patient is breastfeeding this can decrease blood sugar levels and being post delivery makes her very sensitive to insulin. Patient was on an insulin pump prior to conception. Dr. Sharl Ma would be the best individual for recommendations. Patient was seeing him every 2 weeks for adjustments.  Thanks,  Christena Deem RN, MSN, [facility] Inpatient Diabetes Coordinator Team Pager [phone] (8a-5p)

## 2015-01-26 NOTE — Anesthesia Postprocedure Evaluation (Signed)
  Anesthesia Post-op Note  Patient: [name]  Procedure(s) Performed:Lumbar Epidural for L & D  Patient Location: Mother/Baby  Anesthesia Type:Epidural  Level of Consciousness: awake, alert  and oriented  Airway and Oxygen Therapy: Patient Spontanous Breathing  Post-op Pain: none  Post-op Assessment: Post-op Vital signs reviewed, Patient's Cardiovascular Status Stable, Respiratory Function Stable, Patent Airway, No signs of Nausea or vomiting, Pain level controlled, No headache, No backache and Patient able to bend at knees              Post-op Vital Signs: Reviewed and stable  Last Vitals:  Filed Vitals:   01/26/15 1112  BP: 106/50  Pulse: 88  Temp:   Resp: 18    Complications: No apparent anesthesia complications

## 2015-01-27 ENCOUNTER — Encounter (HOSPITAL_COMMUNITY): Payer: Self-pay | Admitting: *Deleted

## 2015-01-27 LAB — GLUCOSE, CAPILLARY
Glucose-Capillary: 125 mg/dL — ABNORMAL HIGH (ref 65–99)
Glucose-Capillary: 171 mg/dL — ABNORMAL HIGH (ref 65–99)
Glucose-Capillary: 178 mg/dL — ABNORMAL HIGH (ref 65–99)
Glucose-Capillary: 204 mg/dL — ABNORMAL HIGH (ref 65–99)
Glucose-Capillary: 216 mg/dL — ABNORMAL HIGH (ref 65–99)

## 2015-01-27 MED ORDER — PNEUMOCOCCAL VAC POLYVALENT 25 MCG/0.5ML IJ INJ
0.5000 mL | INJECTION | INTRAMUSCULAR | Status: DC
  Filled 2015-01-27: qty 0.5

## 2015-01-27 MED ORDER — INSULIN ASPART 100 UNIT/ML ~~LOC~~ SOLN
0.0000 [IU] | Freq: Every day | SUBCUTANEOUS | Status: DC
  Administered 2015-01-28: 2 [IU] via SUBCUTANEOUS

## 2015-01-27 MED ORDER — FUROSEMIDE 10 MG/ML IJ SOLN
20.0000 mg | Freq: Once | INTRAMUSCULAR | Status: AC
  Administered 2015-01-27: 20 mg via INTRAVENOUS
  Filled 2015-01-27: qty 2

## 2015-01-27 MED ORDER — INSULIN ASPART 100 UNIT/ML ~~LOC~~ SOLN
4.0000 [IU] | Freq: Three times a day (TID) | SUBCUTANEOUS | Status: DC
  Administered 2015-01-27 – 2015-01-29 (×4): 4 [IU] via SUBCUTANEOUS

## 2015-01-27 MED ORDER — INSULIN ASPART 100 UNIT/ML ~~LOC~~ SOLN
0.0000 [IU] | Freq: Three times a day (TID) | SUBCUTANEOUS | Status: DC
  Administered 2015-01-27: 7 [IU] via SUBCUTANEOUS
  Administered 2015-01-27: 3 [IU] via SUBCUTANEOUS
  Administered 2015-01-29: 1 [IU] via SUBCUTANEOUS

## 2015-01-27 NOTE — Plan of Care (Signed)
Problem: Consults Goal: Diabetes Guidelines if Diabetic/Glucose > 140 If diabetic or lab glucose is > 140 mg/dl - Initiate Diabetes/Hyperglycemia Guidelines & Document Interventions  Outcome: Completed/Met Date Met:  01/27/15 Diabetic coordinator consulted

## 2015-01-27 NOTE — Progress Notes (Signed)
Patient ID: [name], female   DOB: [birth year], 24 y.o.   MRN: [id number] Postpartum day one Blood pressure 106/50 respiration 18 pulse 100 01 Fundus firm Lochia moderate Legs negative doing well

## 2015-01-27 NOTE — Lactation Note (Signed)
This note was copied from the chart of [name]. Lactation Consultation Note  Patient Name: [name] [id number] Date: 01/27/2015 Reason for consult: Follow-up assessment    With this mom of a NICU baby, now 16 hours old, and 35 1/7 weeks CGA. When I asked mom how pumping was going, she said she had pumped twice today. I reviewed with mom the importance of pumping 8 times a day, to protect her milk supply. I told mom I would check on her tomorrow.   Maternal Data    Feeding    LATCH Score/Interventions                      Lactation Tools Discussed/Used Pump Review: Setup, frequency, and cleaning   Consult Status Consult Status: Follow-up Date: 01/28/15 Follow-up type: In-patient    Alfred Levins 01/27/2015, 4:32 PM

## 2015-01-28 LAB — GLUCOSE, CAPILLARY
Glucose-Capillary: 89 mg/dL (ref 65–99)
Glucose-Capillary: 67 mg/dL (ref 65–99)
Glucose-Capillary: 53 mg/dL — ABNORMAL LOW (ref 65–99)
Glucose-Capillary: 81 mg/dL (ref 65–99)
Glucose-Capillary: 93 mg/dL (ref 65–99)
Glucose-Capillary: 212 mg/dL — ABNORMAL HIGH (ref 65–99)

## 2015-01-28 NOTE — Lactation Note (Signed)
This note was copied from the chart of Kathryn Aliciya Surratt. Lactation Consultation Note; Follow up visit with mom. She reports she pumped 6 times yesterday and 2 times so far today. Did obtain a little Colostrum to take to baby and was pleased about that. Encouraged to pump at least q 3 hours- 8 times/24 hours. Mom for possible DC tomorrow- wants 2 week pump rental until she gets one from her insurance company. No questions at present. To call prn  Patient Name: [name] [id number] Date: 01/28/2015 Reason for consult: Follow-up assessment;NICU baby   Maternal Data Formula Feeding for Exclusion: No Has patient been taught Hand Expression?: Yes Does the patient have breastfeeding experience prior to this delivery?: No  Feeding    LATCH Score/Interventions                      Lactation Tools Discussed/Used WIC Program: No   Consult Status Consult Status: Follow-up Date: 01/29/15 Follow-up type: In-patient    Pamelia Hoit 01/28/2015, 2:16 PM

## 2015-01-28 NOTE — Progress Notes (Signed)
Patient ID: [name], female   DOB: [birth year], 24 y.o.   MRN: [id number] Postpartum day one lead pressure 1 4277 respiration 18 pulse 90 And that no some difficulty controlling her blood sugars yesterday she is doing better today Home on Monday

## 2015-01-28 NOTE — Progress Notes (Signed)
CLINICAL SOCIAL WORK MATERNAL/CHILD NOTE  Patient Details  Name: [name] MRN: [id number] Date of Birth: 01/26/2016  Date: 01/28/2015  Clinical Social Worker Initiating Note: Tzivia Oneil, LCSWDate/ Time Initiated: 01/28/15/1600   Child's Name: [name]   Legal Guardian:  (Parents Elmo Putt and Chaya Jan)   Need for Interpreter: None   Date of Referral: 01/27/15   Reason for Referral:  (NICU admission)   Referral Source: NICU   Address: [place], [place] [address]  Phone number:  [phone])   Household Members: Spouse   Natural Supports (not living in the home): Extended Family, Immediate Family   Professional Supports:None   Employment: (Spouse employed )   Type of Work:     Education:     Printmaker   Other Resources:     Cultural/Religious Considerations Which May Impact Care: none noted  Strengths: Ability to meet basic needs , Home prepared for child , Compliance with medical plan    Risk Factors/Current Problems: None   Cognitive State: Alert , Able to Concentrate    Mood/Affect: Happy    CSW Assessment: Met with both parents. They were pleasant and receptive to social work intervention. Parents are married. They have no other dependents. Both parents are employed and mother reports plan to work from home. Mother reports hx of anxiety. Informed that she was treated with medication for a brief period of time 2 years ago. She denies any current symptoms of depression or anxiety. Both parents seems to be coping well with newborn NICU admission. Informed that they have spoken with the medical team and was told that newborn is doing well. Parents communicate hopes that infant will continue to do well and be released when she goes home. No acute social concerns related at this time. Parents informed of CSW availability.  CSW  Plan/Description:    Discussed signs/symptoms of PP Depression and available resources CSW will follow PRN. No barriers to discharge   Delailah Spieth J, LCSW 01/28/2015, 4:14 PM

## 2015-01-29 LAB — GLUCOSE, CAPILLARY: Glucose-Capillary: 138 mg/dL — ABNORMAL HIGH (ref 65–99)

## 2015-01-29 MED ORDER — HYDROCHLOROTHIAZIDE 25 MG PO TABS: 25.0000 mg | ORAL_TABLET | Freq: Every day | ORAL | Status: DC

## 2015-01-29 MED ORDER — IBUPROFEN 600 MG PO TABS: 600.0000 mg | ORAL_TABLET | Freq: Four times a day (QID) | ORAL | Status: DC | PRN

## 2015-01-29 MED ORDER — LABETALOL HCL 300 MG PO TABS: 300.0000 mg | ORAL_TABLET | Freq: Three times a day (TID) | ORAL | Status: DC

## 2015-01-29 MED ORDER — OXYCODONE-ACETAMINOPHEN 5-325 MG PO TABS: 2.0000 | ORAL_TABLET | ORAL | Status: DC | PRN

## 2015-01-29 NOTE — Progress Notes (Signed)
Post Partum Day 2 Subjective: no complaints  Objective: Blood pressure 159/87, pulse 96, temperature 97.5 F (36.4 C), temperature source Oral, resp. rate 18, height 5' 7"  (1.702 m), weight 223 lb (101.152 kg), last menstrual period 05/26/2014, SpO2 98 %, unknown if currently breastfeeding.  Physical Exam:  General: alert and no distress Lochia: appropriate Uterine Fundus: firm Incision: healing well DVT Evaluation: No evidence of DVT seen on physical exam.  No results for input(s): HGB, HCT in the last 72 hours.  Assessment/Plan: Discharge home   LOS: 13 days   HARPER,CHARLES A 01/29/2015, 8:55 AM

## 2015-01-29 NOTE — Progress Notes (Addendum)
Patient c/o of headache "3" , denies blurring vision nor epigastric pain.  BP=179/83.  DTR= 1+  Clonus= negative    Had to restart IV as occluded when tried to flush.    2355 Labetalol 20 mgs IV given as PRN order.   01/29/15 0005   BP=  177/90  Labetalol 40 mgs IV give.  Denies any discomfort   0020    BP = 168/85  80 mgs Labetalol  IV given

## 2015-01-29 NOTE — Progress Notes (Signed)
Pt is discharged in the care of friend. Downstairs per ambulatory. With R,N, escort. States she understands all discharged instruction . Infant to remain in [place],  No equipment neede for home use. Blood pressure 1000 a.m. 170 /84. Pt encourged to  Rest in bed.  1130 a.m. B/p was 165/69. Dr. Clearance Coots notified of same. No new orders. Dr. Talked to pt on phone. Blood pressure meds were ordered.for home use.. Pt stated that she was ready  To go home and infant was not going to be able to go..tearful but calmming

## 2015-01-29 NOTE — Lactation Note (Signed)
This note was copied from the chart of [name]. Lactation Consultation Note  Patient Name: [name] [id number] Date: 01/29/2015 Reason for consult: Follow-up assessment;NICU baby NICU baby 39 hours old. Mom sleeping when this [place] first went into room. Discussed need for pump with FOB. Mom awoke and stated that she does want a 2-week rental. Parents have paperwork for pump and know to either call for [facility] or visit [place] [facility] store for pump rental. Mom aware of pumping rooms in NICU and enc to take pumping kit. Enc mom to offer STS and nuzzling/latching as she and baby able. Mom aware of OP/BFSG and [place] phone line assistance after D/C.   Maternal Data    Feeding Feeding Type: Formula Length of feed: 60 min  LATCH Score/Interventions                      Lactation Tools Discussed/Used     Consult Status Consult Status: PRN    Inocente Salles 01/29/2015, 10:25 AM

## 2015-01-29 NOTE — Discharge Summary (Signed)
Obstetric Discharge Summary Reason for Admission: Diabetes, out of control. Prenatal Procedures: NST and ultrasound Intrapartum Procedures: spontaneous vaginal delivery Postpartum Procedures: none Complications-Operative and Postpartum: none HEMOGLOBIN  Date Value Ref Range Status  01/26/2015 8.8* 12.0 - 15.0 g/dL Final   HCT  Date Value Ref Range Status  01/26/2015 25.8* 36.0 - 46.0 % Final    Physical Exam:  General: alert and no distress Lochia: appropriate Uterine Fundus: firm Incision: healing well DVT Evaluation: No evidence of DVT seen on physical exam.  Discharge Diagnoses: Term Pregnancy-delivered                                          Anemia.  Clinically stable.    Discharge Information: Date: 01/29/2015 Activity: pelvic rest Diet: routine Medications: PNV, Ibuprofen, Colace, Iron and Percocet Condition: stable Instructions: refer to practice specific booklet Discharge to: home Follow-up Information    Follow up with [name], MD. Schedule an appointment as soon as possible for [name] visit in 2 weeks.   Specialty:  Obstetrics and Gynecology   Contact information:   [address] [place] [address] [phone]       Newborn Data: Live born female  Birth Weight: 6 lb 14 oz (3118 g) APGAR: 8, 9  Home with mother.  [name] 01/29/2015, 9:02 AM

## 2015-01-30 ENCOUNTER — Other Ambulatory Visit (HOSPITAL_COMMUNITY): Payer: 59

## 2015-01-30 LAB — GLUCOSE, CAPILLARY: Glucose-Capillary: 154 mg/dL — ABNORMAL HIGH (ref 65–99)

## 2015-02-02 ENCOUNTER — Ambulatory Visit (HOSPITAL_COMMUNITY): Payer: 59

## 2015-02-02 ENCOUNTER — Other Ambulatory Visit (HOSPITAL_COMMUNITY): Payer: 59

## 2015-02-06 ENCOUNTER — Other Ambulatory Visit (HOSPITAL_COMMUNITY): Payer: 59

## 2015-02-08 ENCOUNTER — Encounter: Payer: Self-pay | Admitting: Obstetrics

## 2015-02-08 ENCOUNTER — Ambulatory Visit (INDEPENDENT_AMBULATORY_CARE_PROVIDER_SITE_OTHER): Payer: 59 | Admitting: Obstetrics

## 2015-02-08 DIAGNOSIS — I10 Essential (primary) hypertension: Secondary | ICD-10-CM

## 2015-02-08 NOTE — Progress Notes (Signed)
Subjective:     [name] is a 24 y.o. female who presents for a postpartum visit. She is 2 weeks postpartum following a outlet vacuum extraction vaginal delivery. I have fully reviewed the prenatal and intrapartum course. The delivery was at 35 gestational weeks. Outcome: vacuum, outlet. Anesthesia: epidural. Postpartum course has been normal. Baby's course has been normal. Baby is feeding by both breast and bottle - Similac Advance. Bleeding moderate lochia. Bowel function is normal. Bladder function is normal. Patient is not sexually active. Contraception method is abstinence. Postpartum depression screening: negative.  Tobacco, alcohol and substance abuse history reviewed.  Adult immunizations reviewed including TDAP, rubella and varicella.  The following portions of the patient's history were reviewed and updated as appropriate: allergies, current medications, past family history, past medical history, past social history, past surgical history and problem list.  Review of Systems A comprehensive review of systems was negative.   Objective:    BP 133/90 mmHg  Pulse 102  Temp(Src) 97.6 F (36.4 C)  Ht 5' 7"  (1.702 m)  Wt 194 lb (87.998 kg)  BMI 30.38 kg/m2  LMP 05/26/2014  Breastfeeding? Yes    100% of 10 min visit spent on counseling and coordination of care.  Assessment:    2 weeks postpartum.  Doing well.  PIH.  Resolving, on Labetalol.  Stable BP.  IDDM.  Stable.  Has seen her PCP  Plan:    1. Contraception: OCP (estrogen/progesterone) 2. Continue Labetalol. 3. Follow up in: 2 weeks or as needed.  BP check.  Healthy lifestyle practices reviewed

## 2015-02-09 ENCOUNTER — Ambulatory Visit (HOSPITAL_COMMUNITY): Payer: 59

## 2015-02-09 ENCOUNTER — Other Ambulatory Visit (HOSPITAL_COMMUNITY): Payer: 59

## 2015-02-13 ENCOUNTER — Other Ambulatory Visit (HOSPITAL_COMMUNITY): Payer: 59

## 2015-02-16 ENCOUNTER — Ambulatory Visit (HOSPITAL_COMMUNITY): Payer: 59

## 2015-02-16 ENCOUNTER — Other Ambulatory Visit (HOSPITAL_COMMUNITY): Payer: 59

## 2015-02-20 ENCOUNTER — Other Ambulatory Visit (HOSPITAL_COMMUNITY): Payer: 59

## 2015-02-21 ENCOUNTER — Ambulatory Visit (INDEPENDENT_AMBULATORY_CARE_PROVIDER_SITE_OTHER): Payer: 59 | Admitting: Obstetrics

## 2015-02-21 DIAGNOSIS — Z30011 Encounter for initial prescription of contraceptive pills: Secondary | ICD-10-CM

## 2015-02-21 NOTE — Progress Notes (Signed)
Subjective:     [name] is a 24 y.o. female who presents for a postpartum visit. She is 3 weeks postpartum following a spontaneous vaginal delivery. I have fully reviewed the prenatal and intrapartum course. The delivery was at 35 gestational weeks. Outcome: spontaneous vaginal delivery. Anesthesia: epidural. Postpartum course has been normal. Baby's course has been normal. Baby is feeding by both breast and bottle - Enfamil Nutramigen. Bleeding thin lochia. Bowel function is normal. Bladder function is normal. Patient is not sexually active. Contraception method is abstinence. Postpartum depression screening: negative.  Tobacco, alcohol and substance abuse history reviewed.  Adult immunizations reviewed including TDAP, rubella and varicella.  The following portions of the patient's history were reviewed and updated as appropriate: allergies, current medications, past family history, past medical history, past social history, past surgical history and problem list.  Review of Systems A comprehensive review of systems was negative.   Objective:    BP 138/89 mmHg  Pulse 112  Wt 191 lb (86.637 kg)   PE:  Deferred   100% of 10 min visit spent on counseling and coordination of care.   Assessment:    3-4 weeks postpartum.  Doing well.  Plan:    1. Contraception: oral progesterone-only contraceptive 2. Micronor Rx 3. Follow up in: 4 weeks or as needed.   Healthy lifestyle practices reviewed

## 2015-02-23 ENCOUNTER — Ambulatory Visit (HOSPITAL_COMMUNITY): Payer: 59

## 2015-02-23 ENCOUNTER — Encounter: Payer: Self-pay | Admitting: Obstetrics

## 2015-02-23 ENCOUNTER — Other Ambulatory Visit (HOSPITAL_COMMUNITY): Payer: 59

## 2015-02-27 ENCOUNTER — Other Ambulatory Visit (HOSPITAL_COMMUNITY): Payer: 59

## 2015-03-02 ENCOUNTER — Ambulatory Visit (HOSPITAL_COMMUNITY): Payer: 59

## 2015-03-02 ENCOUNTER — Other Ambulatory Visit (HOSPITAL_COMMUNITY): Payer: 59

## 2015-03-21 ENCOUNTER — Ambulatory Visit: Payer: 59 | Admitting: Obstetrics

## 2015-03-29 ENCOUNTER — Encounter: Payer: Self-pay | Admitting: Obstetrics

## 2015-03-29 ENCOUNTER — Ambulatory Visit (INDEPENDENT_AMBULATORY_CARE_PROVIDER_SITE_OTHER): Payer: 59 | Admitting: Obstetrics

## 2015-03-29 DIAGNOSIS — Z30011 Encounter for initial prescription of contraceptive pills: Secondary | ICD-10-CM

## 2015-03-29 MED ORDER — NORETHIN ACE-ETH ESTRAD-FE 1-20 MG-MCG(24) PO TABS: 1.0000 | ORAL_TABLET | Freq: Every day | ORAL | Status: DC

## 2015-03-29 NOTE — Progress Notes (Signed)
Subjective:     [name] is a 24 y.o. female who presents for a postpartum visit. She is 8 weeks postpartum following a outlet vacuum vaginal delivery. I have fully reviewed the prenatal and intrapartum course. The delivery was at 35 gestational weeks. Outcome: vacuum, outlet. Anesthesia: epidural. Postpartum course has been normal. Baby's course has been normal. Baby is feeding by bottle - Similac Advance. Bleeding no bleeding. Bowel function is normal. Bladder function is normal. Patient is not sexually active. Contraception method is abstinence. Postpartum depression screening: negative.  Tobacco, alcohol and substance abuse history reviewed.  Adult immunizations reviewed including TDAP, rubella and varicella.  The following portions of the patient's history were reviewed and updated as appropriate: allergies, current medications, past family history, past medical history, past social history, past surgical history and problem list.  Review of Systems A comprehensive review of systems was negative.   Objective:    There were no vitals taken for this visit.  General:  alert and no distress   Breasts:  inspection negative, no nipple discharge or bleeding, no masses or nodularity palpable  Lungs: clear to auscultation bilaterally  Heart:  regular rate and rhythm, S1, S2 normal, no murmur, click, rub or gallop  Abdomen: normal findings: soft, non-tender   Vulva:  normal  Vagina: normal vagina  Cervix:  no cervical motion tenderness  Corpus: normal size, contour, position, consistency, mobility, non-tender  Adnexa:  no mass, fullness, tenderness  Rectal Exam: Not performed.           Assessment:     Normal postpartum exam. Pap smear not done at today's visit.     Contraceptive counseling and advice  Plan:    1. Contraception: OCP (estrogen/progesterone) 2. Loestrin Fe 24 Rx 3. Follow up in: 6 months or as needed.   Healthy lifestyle practices reviewed

## 2015-04-29 ENCOUNTER — Emergency Department (HOSPITAL_COMMUNITY): Payer: 59

## 2015-04-29 ENCOUNTER — Encounter (HOSPITAL_COMMUNITY): Payer: Self-pay

## 2015-04-29 ENCOUNTER — Emergency Department (HOSPITAL_COMMUNITY)
Admission: EM | Admit: 2015-04-29 | Discharge: 2015-04-29 | Disposition: A | Discharge status: Home / Self Care | Payer: 59 | Attending: Emergency Medicine | Admitting: Emergency Medicine

## 2015-04-29 DIAGNOSIS — Z794 Long term (current) use of insulin: Secondary | ICD-10-CM | POA: Insufficient documentation

## 2015-04-29 DIAGNOSIS — Z8739 Personal history of other diseases of the musculoskeletal system and connective tissue: Secondary | ICD-10-CM | POA: Insufficient documentation

## 2015-04-29 DIAGNOSIS — I10 Essential (primary) hypertension: Secondary | ICD-10-CM | POA: Insufficient documentation

## 2015-04-29 DIAGNOSIS — Z79899 Other long term (current) drug therapy: Secondary | ICD-10-CM | POA: Diagnosis not present

## 2015-04-29 DIAGNOSIS — Z87440 Personal history of urinary (tract) infections: Secondary | ICD-10-CM | POA: Diagnosis not present

## 2015-04-29 DIAGNOSIS — E109 Type 1 diabetes mellitus without complications: Secondary | ICD-10-CM | POA: Diagnosis not present

## 2015-04-29 DIAGNOSIS — Z8659 Personal history of other mental and behavioral disorders: Secondary | ICD-10-CM | POA: Diagnosis not present

## 2015-04-29 DIAGNOSIS — R000 Tachycardia, unspecified: Secondary | ICD-10-CM | POA: Diagnosis not present

## 2015-04-29 DIAGNOSIS — J069 Acute upper respiratory infection, unspecified: Secondary | ICD-10-CM | POA: Diagnosis not present

## 2015-04-29 DIAGNOSIS — Z8719 Personal history of other diseases of the digestive system: Secondary | ICD-10-CM | POA: Diagnosis not present

## 2015-04-29 DIAGNOSIS — R05 Cough: Secondary | ICD-10-CM | POA: Diagnosis present

## 2015-04-29 LAB — BASIC METABOLIC PANEL
Anion gap: 13 (ref 5–15)
BUN: 18 mg/dL (ref 6–20)
CO2: 20 mmol/L — ABNORMAL LOW (ref 22–32)
Calcium: 9.2 mg/dL (ref 8.9–10.3)
Chloride: 101 mmol/L (ref 101–111)
Creatinine, Ser: 0.73 mg/dL (ref 0.44–1.00)
GFR calc Af Amer: >60 mL/min (ref >60)
GFR calc non Af Amer: >60 mL/min (ref >60)
Glucose, Bld: 276 mg/dL — ABNORMAL HIGH (ref 65–99)
Potassium: 3.7 mmol/L (ref 3.5–5.1)
Sodium: 134 mmol/L — ABNORMAL LOW (ref 135–145)

## 2015-04-29 LAB — CBC WITH DIFFERENTIAL/PLATELET
Basophils Absolute: 0.1 10*3/uL (ref 0.0–0.1)
Basophils Relative: 1 %
Eosinophils Absolute: 0.2 10*3/uL (ref 0.0–0.7)
Eosinophils Relative: 3 %
HCT: 36.2 % (ref 36.0–46.0)
Hemoglobin: 12.6 g/dL (ref 12.0–15.0)
Lymphocytes Relative: 42 %
Lymphs Abs: 2.6 10*3/uL (ref 0.7–4.0)
MCH: 28.5 pg (ref 26.0–34.0)
MCHC: 34.8 g/dL (ref 30.0–36.0)
MCV: 81.9 fL (ref 78.0–100.0)
Monocytes Absolute: 0.5 10*3/uL (ref 0.1–1.0)
Monocytes Relative: 8 %
Neutro Abs: 2.9 10*3/uL (ref 1.7–7.7)
Neutrophils Relative %: 46 %
Platelets: 296 10*3/uL (ref 150–400)
RBC: 4.42 MIL/uL (ref 3.87–5.11)
RDW: 13.9 % (ref 11.5–15.5)
WBC: 6.2 10*3/uL (ref 4.0–10.5)

## 2015-04-29 IMAGING — CR DG CHEST 2V
2 series · 2 of 2 positions shown · non-contrast
Comparison: [DATE]

CLINICAL DATA: Chest pain and shortness of breath for 2 days

EXAM:
CHEST  2 VIEW

[w chest pa]
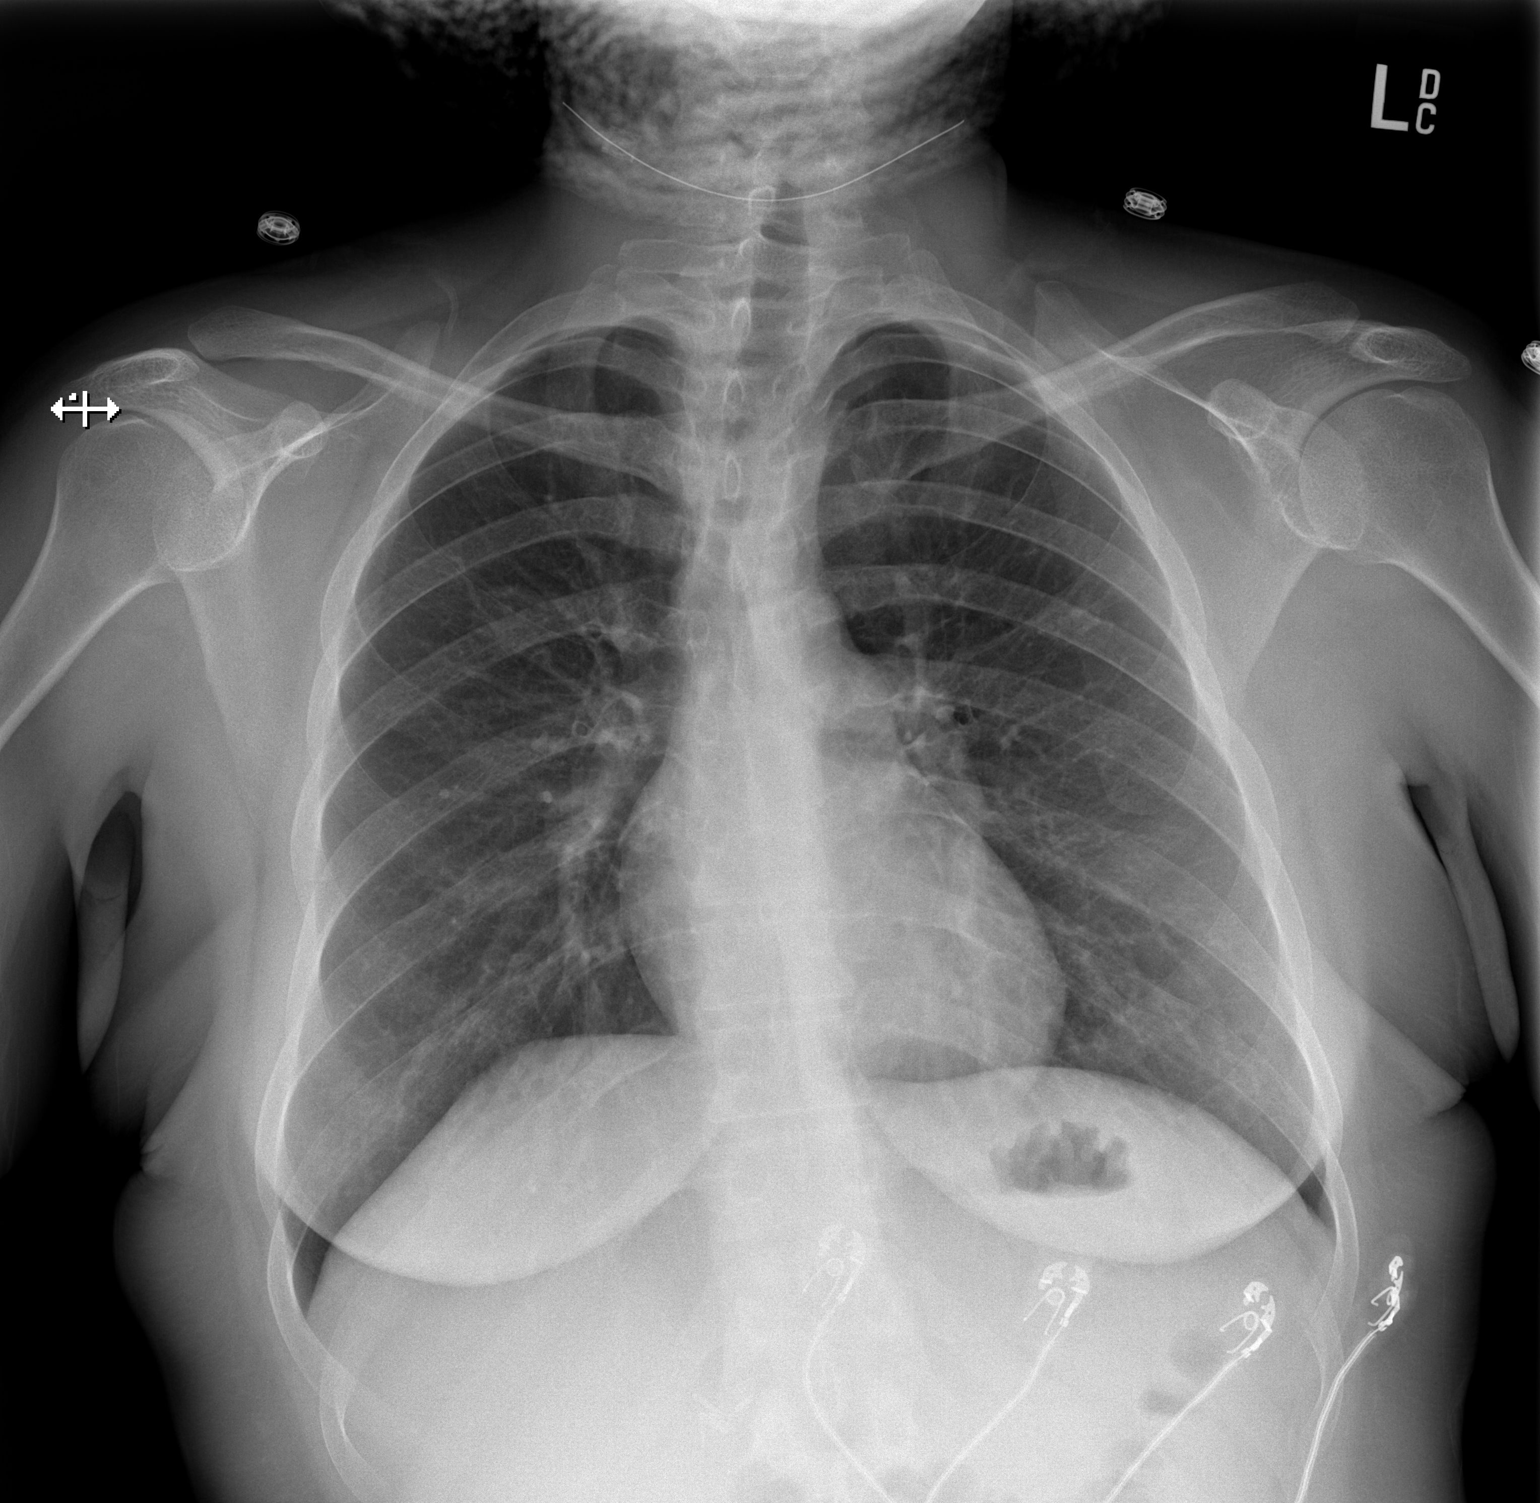

[w chest lat]
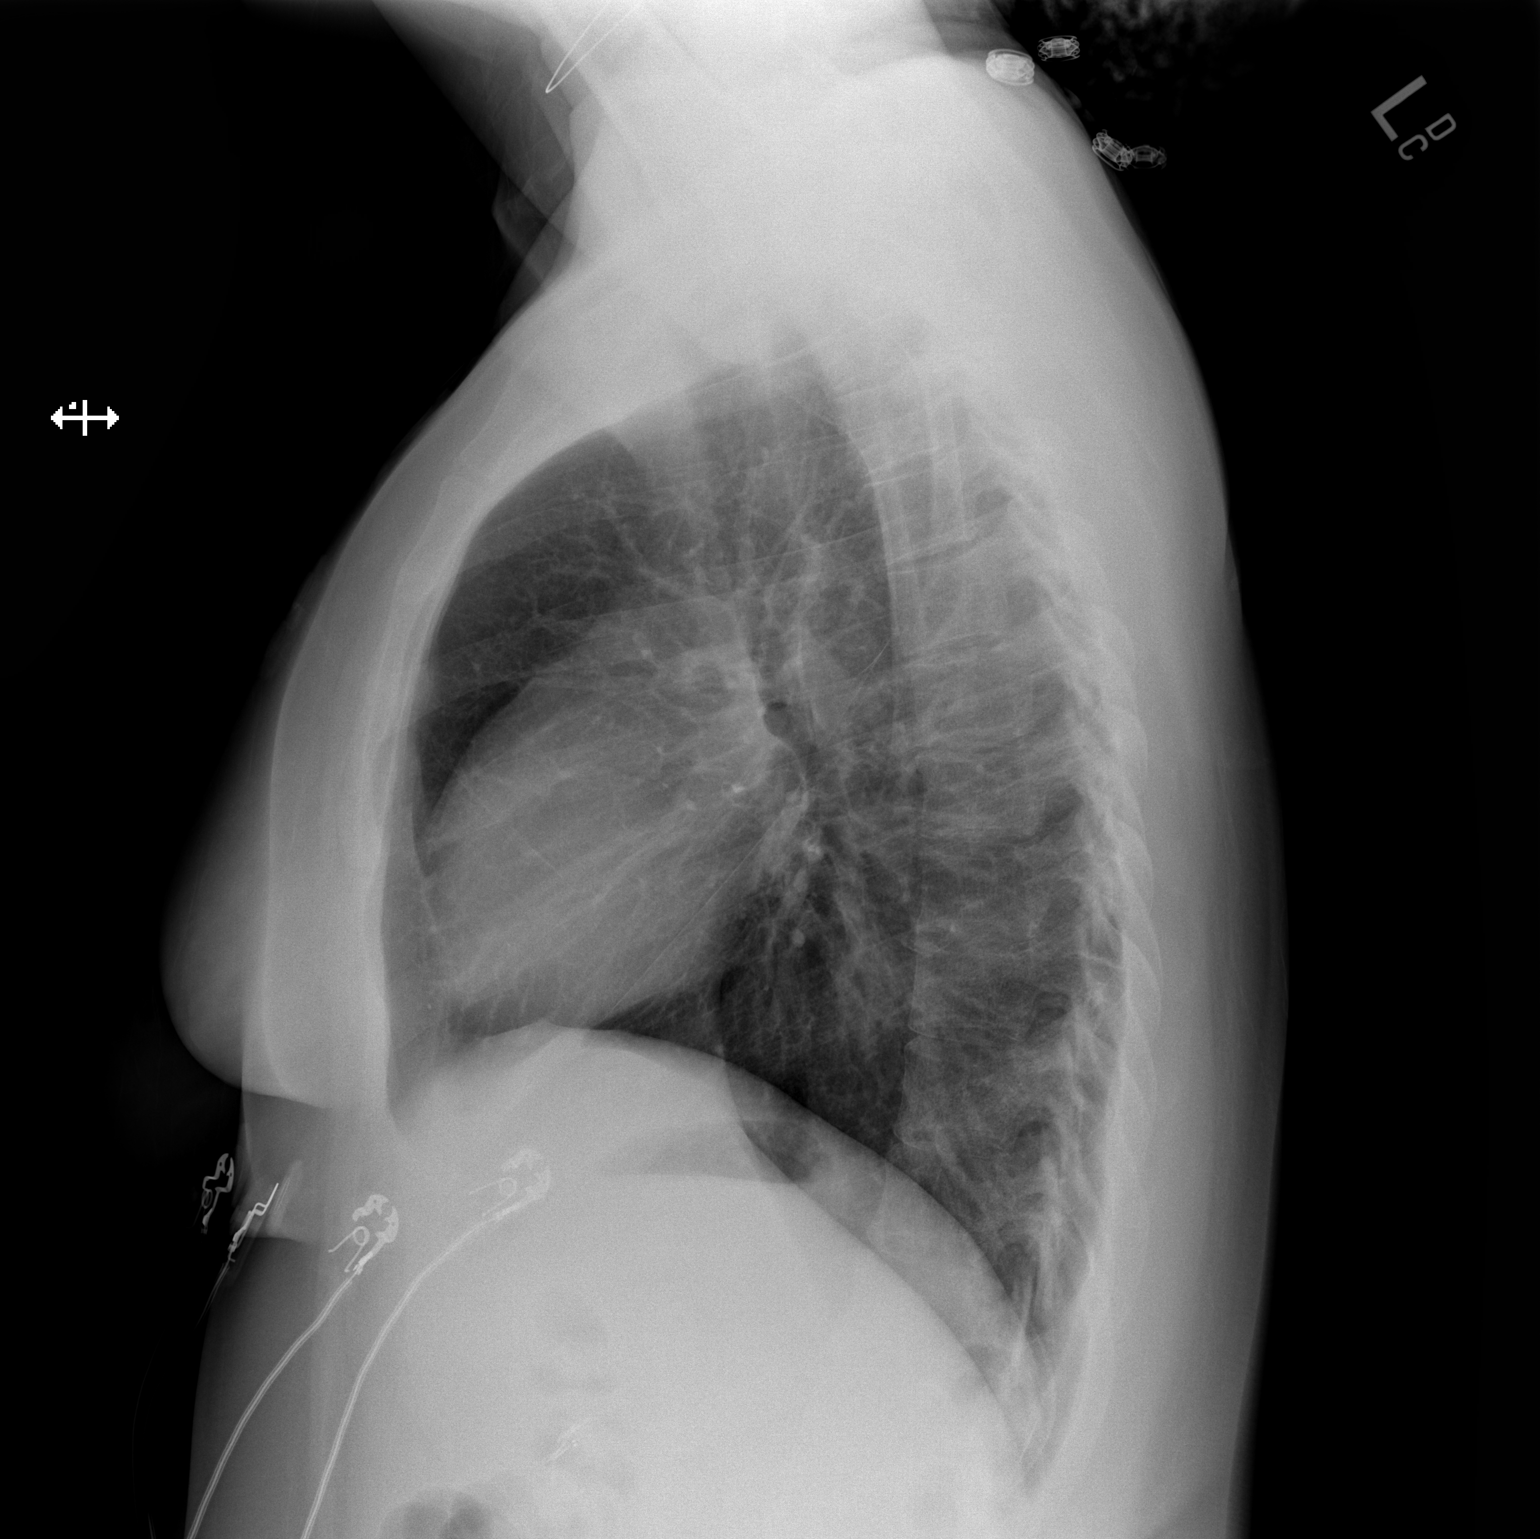

[2 of 2 positions shown; findings below may reference images not displayed]

FINDINGS: The heart size and mediastinal contours are within normal limits.
Both lungs are clear. The visualized skeletal structures are
unremarkable.
IMPRESSION: No active cardiopulmonary disease.

## 2015-04-29 MED ORDER — SODIUM CHLORIDE 0.9 % IV BOLUS (SEPSIS)
1000.0000 mL | Freq: Once | INTRAVENOUS | Status: AC
  Administered 2015-04-29: 1000 mL via INTRAVENOUS

## 2015-04-29 MED ORDER — GUAIFENESIN-CODEINE 100-10 MG/5ML PO SOLN: 10.0000 mL | Freq: Three times a day (TID) | ORAL | Status: DC | PRN

## 2015-04-29 NOTE — Discharge Instructions (Signed)
Your blood sugar was  276 today.  Take your insulin as directed and drink plenty of fluids.     Upper Respiratory Infection, Adult Most upper respiratory infections (URIs) are a viral infection of the air passages leading to the lungs. A URI affects the nose, throat, and upper air passages. The most common type of URI is nasopharyngitis and is typically referred to as "the common cold." URIs run their course and usually go away on their own. Most of the time, a URI does not require medical attention, but sometimes a bacterial infection in the upper airways can follow a viral infection. This is called a secondary infection. Sinus and middle ear infections are common types of secondary upper respiratory infections. Bacterial pneumonia can also complicate a URI. A URI can worsen asthma and chronic obstructive pulmonary disease (COPD). Sometimes, these complications can require emergency medical care and may be life threatening.  CAUSES Almost all URIs are caused by viruses. A virus is a type of germ and can spread from one person to another.  RISKS FACTORS You may be at risk for a URI if:   You smoke.   You have chronic heart or lung disease.  You have a weakened defense (immune) system.   You are very young or very old.   You have nasal allergies or asthma.  You work in crowded or poorly ventilated areas.  You work in health care facilities or schools. SIGNS AND SYMPTOMS  Symptoms typically develop 2-3 days after you come in contact with a cold virus. Most viral URIs last 7-10 days. However, viral URIs from the influenza virus (flu virus) can last 14-18 days and are typically more severe. Symptoms may include:   Runny or stuffy (congested) nose.   Sneezing.   Cough.   Sore throat.   Headache.   Fatigue.   Fever.   Loss of appetite.   Pain in your forehead, behind your eyes, and over your cheekbones (sinus pain).  Muscle aches.  DIAGNOSIS  Your health care  provider may diagnose a URI by:  Physical exam.  Tests to check that your symptoms are not due to another condition such as:  Strep throat.  Sinusitis.  Pneumonia.  Asthma. TREATMENT  A URI goes away on its own with time. It cannot be cured with medicines, but medicines may be prescribed or recommended to relieve symptoms. Medicines may help:  Reduce your fever.  Reduce your cough.  Relieve nasal congestion. HOME CARE INSTRUCTIONS   Take medicines only as directed by your health care provider.   Gargle warm saltwater or take cough drops to comfort your throat as directed by your health care provider.  Use a warm mist humidifier or inhale steam from a shower to increase air moisture. This may make it easier to breathe.  Drink enough fluid to keep your urine clear or pale yellow.   Eat soups and other clear broths and maintain good nutrition.   Rest as needed.   Return to work when your temperature has returned to normal or as your health care provider advises. You may need to stay home longer to avoid infecting others. You can also use a face mask and careful hand washing to prevent spread of the virus.  Increase the usage of your inhaler if you have asthma.   Do not use any tobacco products, including cigarettes, chewing tobacco, or electronic cigarettes. If you need help quitting, ask your health care provider. PREVENTION  The best way to  protect yourself from getting a cold is to practice good hygiene.   Avoid oral or hand contact with people with cold symptoms.   Wash your hands often if contact occurs.  There is no clear evidence that vitamin C, vitamin E, echinacea, or exercise reduces the chance of developing a cold. However, it is always recommended to get plenty of rest, exercise, and practice good nutrition.  SEEK MEDICAL CARE IF:   You are getting worse rather than better.   Your symptoms are not controlled by medicine.   You have chills.  You  have worsening shortness of breath.  You have brown or red mucus.  You have yellow or brown nasal discharge.  You have pain in your face, especially when you bend forward.  You have a fever.  You have swollen neck glands.  You have pain while swallowing.  You have white areas in the back of your throat. SEEK IMMEDIATE MEDICAL CARE IF:   You have severe or persistent:  Headache.  Ear pain.  Sinus pain.  Chest pain.  You have chronic lung disease and any of the following:  Wheezing.  Prolonged cough.  Coughing up blood.  A change in your usual mucus.  You have a stiff neck.  You have changes in your:  Vision.  Hearing.  Thinking.  Mood. MAKE SURE YOU:   Understand these instructions.  Will watch your condition.  Will get help right away if you are not doing well or get worse.   This information is not intended to replace advice given to you by your health care provider. Make sure you discuss any questions you have with your health care provider.   Document Released: 09/10/2000 Document Revised: 08/01/2014 Document Reviewed: 06/22/2013 Elsevier Interactive Patient Education [employer].

## 2015-04-29 NOTE — ED Notes (Signed)
Pt complains of a cough and body aches since yesterday

## 2015-04-29 NOTE — ED Provider Notes (Signed)
CSN: [id number]     Arrival date & time 04/29/15  4166 History   First MD Initiated Contact with Patient 04/29/15 0710     Chief Complaint  Patient presents with  . Cough      The history is provided by the patient. No language interpreter was used.   [name] is a 25 y.o. female w/ hx/o T1DM and three months postpartum who presents to the Emergency Department complaining of cough.  Yesterday she developed cough, chest tightness, and shortness of breath.  Her husband has recently had similar sxs.  She reports associated feelings of significant generalized weakness and body aches.  She denies any fevers, abdominal pain, vomiting, leg swelling or pain. She is 3 months postpartum and has had no problems since the delivery. She is a type I diabetic and her sugars have been well-controlled. This morning her blood sugar was 120. Symptoms are moderate, constant, worsening.  Past Medical History  Diagnosis Date  . Hypertension   . Gastroparesis   . Anxiety   . Infection     UTI April 2016  . Fibromyalgia   . Diabetes mellitus   . Diabetes type 1, uncontrolled ([place]) 11/14/2011    Since age 78   Past Surgical History  Procedure Laterality Date  . Ankle surgery    . Cholecystectomy  11/15/2011    Procedure: LAPAROSCOPIC CHOLECYSTECTOMY WITH INTRAOPERATIVE CHOLANGIOGRAM;  Surgeon: [name], MD;  Location: [facility];  Service: General;  Laterality: N/A;  . Laparoscopy  11/23/2011    Procedure: LAPAROSCOPY DIAGNOSTIC;  Surgeon: Edward Jolly, MD;  Location: [facility];  Service: General;  Laterality: N/A;  . Esophagogastroduodenoscopy  12/03/2011    Procedure: ESOPHAGOGASTRODUODENOSCOPY (EGD);  Surgeon: Beryle Beams, MD;  Location: [facility];  Service: Endoscopy;  Laterality: N/A;   Family History  Problem Relation Age of Onset  . Diabetes Mother   . Hypertension Father    Social History  Substance Use Topics  . Smoking status: Never Smoker   . Smokeless tobacco: Never Used   . Alcohol Use: No   OB History    Gravida Para Term Preterm AB TAB SAB Ectopic Multiple Living   1 1  1      0 1     Review of Systems  All other systems reviewed and are negative.     Allergies  Peanut-containing drug products and Lactose intolerance (gi)  Home Medications   Prior to Admission medications   Medication Sig Start Date End Date Taking? Authorizing Provider  butalbital-acetaminophen-caffeine (FIORICET) 50-325-40 MG per tablet Take 1-2 tablets by mouth every 6 (six) hours as needed for headache. 11/09/14 11/09/15  Tresea Mall, CNM  calcium carbonate (TUMS - DOSED IN MG ELEMENTAL CALCIUM) 500 MG chewable tablet Chew 2 tablets by mouth daily as needed for indigestion or heartburn.    Historical Provider, MD  Cholecalciferol (VITAMIN D PO) Take 2 each by mouth daily.     Historical Provider, MD  EPINEPHrine (EPIPEN 2-PAK) 0.3 mg/0.3 mL IJ SOAJ injection Inject 0.3 mLs (0.3 mg total) into the muscle once. 08/10/14   Rachelle A Denney, CNM  glucagon (GLUCAGON EMERGENCY) 1 MG injection Inject 1 mg into the vein once as needed (low blood sugar).    Historical Provider, MD  glucose 4 GM chewable tablet Chew 1 tablet (4 g total) by mouth as needed for low blood sugar. 08/10/14   Rachelle A Denney, CNM  guaiFENesin-codeine 100-10 MG/5ML syrup Take 10 mLs by  mouth every 8 (eight) hours as needed for cough. 04/29/15   Quintella Reichert, MD  hydrochlorothiazide (HYDRODIURIL) 25 MG tablet Take 1 tablet (25 mg total) by mouth daily. 01/29/15   Shelly Bombard, MD  ibuprofen (ADVIL,MOTRIN) 600 MG tablet Take 1 tablet (600 mg total) by mouth every 6 (six) hours as needed for mild pain. 01/29/15   Shelly Bombard, MD  insulin detemir (LEVEMIR) 100 UNIT/ML injection Inject 14 Units into the skin 2 (two) times daily.    Historical Provider, MD  insulin lispro (HUMALOG) 100 UNIT/ML injection Inject 2-12 Units into the skin 3 (three) times daily as needed for high blood sugar. Pt uses as needed  per sliding scale.    Historical Provider, MD  ketotifen (ZADITOR) 0.025 % ophthalmic solution Place 1 drop into both eyes 2 (two) times daily as needed (dry eyes).    Historical Provider, MD  labetalol (NORMODYNE) 300 MG tablet Take 1 tablet (300 mg total) by mouth 3 (three) times daily. 01/29/15   Shelly Bombard, MD  Norethindrone Acetate-Ethinyl Estrad-FE (LOESTRIN 24 FE) 1-20 MG-MCG(24) tablet Take 1 tablet by mouth daily. 03/29/15   Shelly Bombard, MD  oxyCODONE-acetaminophen (PERCOCET/ROXICET) 5-325 MG tablet Take 2 tablets by mouth every 4 (four) hours as needed for moderate pain or severe pain (for pain scale greater than 7). Patient not taking: Reported on 03/29/2015 01/29/15   Shelly Bombard, MD  Prenat w/o A-FeCbn-Meth-FA-DHA (OB COMPLETE GOLD) 27.5-1-200 MG CAPS Take 1 tablet by mouth daily. Patient taking differently: Take 1 capsule by mouth daily.  09/15/14   Rachelle A Denney, CNM  ranitidine (ZANTAC) 150 MG capsule Take 1 capsule (150 mg total) by mouth 2 (two) times daily. 01/02/15   Shelly Bombard, MD   BP 139/92 mmHg  Pulse 99  Temp(Src) 97.8 F (36.6 C) (Oral)  Resp 17  Ht 5' 7"  (1.702 m)  Wt 175 lb (79.379 kg)  BMI 27.40 kg/m2  SpO2 100%  LMP 04/29/2015 Physical Exam  Constitutional: She is oriented to person, place, and time. She appears well-developed and well-nourished.  HENT:  Head: Normocephalic and atraumatic.  Cardiovascular: Regular rhythm.   No murmur heard. tachycardic  Pulmonary/Chest: Effort normal and breath sounds normal. No respiratory distress.  Abdominal: Soft. There is no tenderness. There is no rebound and no guarding.  Musculoskeletal: She exhibits no edema or tenderness.  Neurological: She is alert and oriented to person, place, and time.  Skin: Skin is warm and dry.  Psychiatric: She has a normal mood and affect. Her behavior is normal.  Nursing note and vitals reviewed.   ED Course  Procedures (including critical care time) Labs  Review Labs Reviewed  BASIC METABOLIC PANEL - Abnormal; Notable for the following:    Sodium 134 (*)    CO2 20 (*)    Glucose, Bld 276 (*)    All other components within normal limits  CBC WITH DIFFERENTIAL/PLATELET    Imaging Review Dg Chest 2 View  04/29/2015  CLINICAL DATA:  Chest pain and shortness of breath for 2 days EXAM: CHEST  2 VIEW COMPARISON:  10/05/2013 FINDINGS: The heart size and mediastinal contours are within normal limits. Both lungs are clear. The visualized skeletal structures are unremarkable. IMPRESSION: No active cardiopulmonary disease. Electronically Signed   By: Inez Catalina M.D.   On: 04/29/2015 08:23   I have personally reviewed and evaluated these images and lab results as part of my medical decision-making.   EKG Interpretation  Date/Time:  Sunday April 29 2015 07:31:56 EST Ventricular Rate:  105 PR Interval:  141 QRS Duration: 87 QT Interval:  336 QTC Calculation: 444 R Axis:   67 Text Interpretation:  Sinus tachycardia Borderline Q waves in inferior  leads agree. no change from old Confirmed by Johnney Killian, MD, Jeannie Done [phone])  on 04/29/2015 7:42:15 AM      MDM   Final diagnoses:  Acute URI    Patient with type 1 diabetes here for cough, body aches. She does have sick contacts in the house. She is nontoxic appearing on examination. There is no evidence of acute asthma, pneumonia, CHF or cardiomyopathy. Discussed with patient home care for upper respiratory infection with oral fluid hydration, rest. Provided Rx for cough syrup. Close return precautions were discussed.    Quintella Reichert, MD 04/29/15 1620

## 2015-04-30 ENCOUNTER — Emergency Department (HOSPITAL_COMMUNITY)
Admission: EM | Admit: 2015-04-30 | Discharge: 2015-04-30 | Disposition: A | Discharge status: Home / Self Care | Payer: 59 | Attending: Emergency Medicine | Admitting: Emergency Medicine

## 2015-04-30 ENCOUNTER — Encounter (HOSPITAL_COMMUNITY): Payer: Self-pay | Admitting: Emergency Medicine

## 2015-04-30 DIAGNOSIS — Z8659 Personal history of other mental and behavioral disorders: Secondary | ICD-10-CM | POA: Insufficient documentation

## 2015-04-30 DIAGNOSIS — Z79899 Other long term (current) drug therapy: Secondary | ICD-10-CM | POA: Diagnosis not present

## 2015-04-30 DIAGNOSIS — Z79818 Long term (current) use of other agents affecting estrogen receptors and estrogen levels: Secondary | ICD-10-CM | POA: Insufficient documentation

## 2015-04-30 DIAGNOSIS — Z87440 Personal history of urinary (tract) infections: Secondary | ICD-10-CM | POA: Insufficient documentation

## 2015-04-30 DIAGNOSIS — Z9049 Acquired absence of other specified parts of digestive tract: Secondary | ICD-10-CM | POA: Insufficient documentation

## 2015-04-30 DIAGNOSIS — Z8719 Personal history of other diseases of the digestive system: Secondary | ICD-10-CM | POA: Diagnosis not present

## 2015-04-30 DIAGNOSIS — R000 Tachycardia, unspecified: Secondary | ICD-10-CM | POA: Insufficient documentation

## 2015-04-30 DIAGNOSIS — R1084 Generalized abdominal pain: Secondary | ICD-10-CM | POA: Insufficient documentation

## 2015-04-30 DIAGNOSIS — Z794 Long term (current) use of insulin: Secondary | ICD-10-CM | POA: Diagnosis not present

## 2015-04-30 DIAGNOSIS — I10 Essential (primary) hypertension: Secondary | ICD-10-CM | POA: Insufficient documentation

## 2015-04-30 DIAGNOSIS — R112 Nausea with vomiting, unspecified: Secondary | ICD-10-CM | POA: Diagnosis not present

## 2015-04-30 DIAGNOSIS — R197 Diarrhea, unspecified: Secondary | ICD-10-CM | POA: Insufficient documentation

## 2015-04-30 DIAGNOSIS — E860 Dehydration: Secondary | ICD-10-CM | POA: Diagnosis not present

## 2015-04-30 DIAGNOSIS — Z3202 Encounter for pregnancy test, result negative: Secondary | ICD-10-CM | POA: Insufficient documentation

## 2015-04-30 DIAGNOSIS — E109 Type 1 diabetes mellitus without complications: Secondary | ICD-10-CM | POA: Diagnosis not present

## 2015-04-30 LAB — COMPREHENSIVE METABOLIC PANEL
ALT: 10 U/L — ABNORMAL LOW (ref 14–54)
AST: 22 U/L (ref 15–41)
Albumin: 3.7 g/dL (ref 3.5–5.0)
Alkaline Phosphatase: 90 U/L (ref 38–126)
Anion gap: 14 (ref 5–15)
BUN: 16 mg/dL (ref 6–20)
CO2: 15 mmol/L — ABNORMAL LOW (ref 22–32)
Calcium: 9 mg/dL (ref 8.9–10.3)
Chloride: 108 mmol/L (ref 101–111)
Creatinine, Ser: 0.81 mg/dL (ref 0.44–1.00)
GFR calc Af Amer: >60 mL/min (ref >60)
GFR calc non Af Amer: >60 mL/min (ref >60)
Glucose, Bld: 170 mg/dL — ABNORMAL HIGH (ref 65–99)
Potassium: 3.1 mmol/L — ABNORMAL LOW (ref 3.5–5.1)
Sodium: 137 mmol/L (ref 135–145)
Total Bilirubin: 1.1 mg/dL (ref 0.3–1.2)
Total Protein: 8.2 g/dL — ABNORMAL HIGH (ref 6.5–8.1)

## 2015-04-30 LAB — CBC
HCT: 38.2 % (ref 36.0–46.0)
Hemoglobin: 13.1 g/dL (ref 12.0–15.0)
MCH: 29 pg (ref 26.0–34.0)
MCHC: 34.3 g/dL (ref 30.0–36.0)
MCV: 84.7 fL (ref 78.0–100.0)
Platelets: 305 10*3/uL (ref 150–400)
RBC: 4.51 MIL/uL (ref 3.87–5.11)
RDW: 14 % (ref 11.5–15.5)
WBC: 5.3 10*3/uL (ref 4.0–10.5)

## 2015-04-30 LAB — URINE MICROSCOPIC-ADD ON

## 2015-04-30 LAB — URINALYSIS, ROUTINE W REFLEX MICROSCOPIC
Glucose, UA: >1000 mg/dL — AB
Ketones, ur: >80 mg/dL — AB
Leukocytes, UA: NEGATIVE
Nitrite: NEGATIVE
Protein, ur: 30 mg/dL — AB
Specific Gravity, Urine: 1.037 — ABNORMAL HIGH (ref 1.005–1.030)
pH: 6 (ref 5.0–8.0)

## 2015-04-30 LAB — I-STAT BETA HCG BLOOD, ED (MC, WL, AP ONLY): I-stat hCG, quantitative: <5 m[IU]/mL (ref <5)

## 2015-04-30 LAB — LIPASE, BLOOD: Lipase: 19 U/L (ref 11–51)

## 2015-04-30 MED ORDER — POTASSIUM CHLORIDE CRYS ER 20 MEQ PO TBCR
40.0000 meq | EXTENDED_RELEASE_TABLET | Freq: Once | ORAL | Status: AC
  Administered 2015-04-30: 40 meq via ORAL
  Filled 2015-04-30: qty 2

## 2015-04-30 MED ORDER — MORPHINE SULFATE (PF) 4 MG/ML IV SOLN
6.0000 mg | Freq: Once | INTRAVENOUS | Status: AC
  Administered 2015-04-30: 6 mg via INTRAVENOUS
  Filled 2015-04-30 (×2): qty 2

## 2015-04-30 MED ORDER — MORPHINE SULFATE (PF) 4 MG/ML IV SOLN
4.0000 mg | Freq: Once | INTRAVENOUS | Status: AC
  Administered 2015-04-30: 4 mg via INTRAVENOUS
  Filled 2015-04-30: qty 1

## 2015-04-30 MED ORDER — PROMETHAZINE HCL 25 MG/ML IJ SOLN
25.0000 mg | Freq: Once | INTRAMUSCULAR | Status: AC
  Administered 2015-04-30: 25 mg via INTRAVENOUS
  Filled 2015-04-30: qty 1

## 2015-04-30 MED ORDER — SODIUM CHLORIDE 0.9 % IV BOLUS (SEPSIS)
1000.0000 mL | Freq: Once | INTRAVENOUS | Status: AC
  Administered 2015-04-30: 1000 mL via INTRAVENOUS

## 2015-04-30 MED ORDER — FAMOTIDINE IN NACL 20-0.9 MG/50ML-% IV SOLN
20.0000 mg | Freq: Once | INTRAVENOUS | Status: AC
  Administered 2015-04-30: 20 mg via INTRAVENOUS
  Filled 2015-04-30: qty 50

## 2015-04-30 MED ORDER — POTASSIUM CHLORIDE CRYS ER 20 MEQ PO TBCR: 20.0000 meq | EXTENDED_RELEASE_TABLET | Freq: Every day | ORAL | Status: DC

## 2015-04-30 MED ORDER — ONDANSETRON HCL 4 MG PO TABS: 4.0000 mg | ORAL_TABLET | Freq: Three times a day (TID) | ORAL | Status: DC | PRN

## 2015-04-30 MED ORDER — ONDANSETRON HCL 4 MG/2ML IJ SOLN
4.0000 mg | Freq: Once | INTRAMUSCULAR | Status: AC
  Administered 2015-04-30: 4 mg via INTRAVENOUS
  Filled 2015-04-30: qty 2

## 2015-04-30 NOTE — ED Notes (Signed)
Pt reported that she was having dizziness/lightheadedness, n/v x8 and diarrhea x6 in past 24hrs. Pt reported having epigastic pain. Abd soft/nondistended but tender to palpate. Pt noted HR120 but denies visual disturbances and syncopal episodes.

## 2015-04-30 NOTE — ED Notes (Signed)
Pt c/o epigastric abdominal pain, emesis, diarrhea onset today. Pt vaginally delivered baby 3 months ago.

## 2015-04-30 NOTE — ED Notes (Signed)
After being triaged, pt vomited, following this pt's heart rate rose to 155 and remained there for several minutes. This RN attempted to lower heart rate by having patient bear down, no effect. Pt transported to room at that time. Radford Pax, MD, aware of heart rate.

## 2015-04-30 NOTE — ED Notes (Signed)
Pt in bathroom, unable to triage at this time. 

## 2015-04-30 NOTE — Discharge Instructions (Signed)
Viral Gastroenteritis Viral gastroenteritis is also known as stomach flu. This condition affects the stomach and intestinal tract. It can cause sudden diarrhea and vomiting. The illness typically lasts 3 to 8 days. Most people develop an immune response that eventually gets rid of the virus. While this natural response develops, the virus can make you quite ill. CAUSES  Many different viruses can cause gastroenteritis, such as rotavirus or noroviruses. You can catch one of these viruses by consuming contaminated food or water. You may also catch a virus by sharing utensils or other personal items with an infected person or by touching a contaminated surface. SYMPTOMS  The most common symptoms are diarrhea and vomiting. These problems can cause a severe loss of body fluids (dehydration) and a body salt (electrolyte) imbalance. Other symptoms may include:  Fever.  Headache.  Fatigue.  Abdominal pain. DIAGNOSIS  Your caregiver can usually diagnose viral gastroenteritis based on your symptoms and a physical exam. A stool sample may also be taken to test for the presence of viruses or other infections. TREATMENT  This illness typically goes away on its own. Treatments are aimed at rehydration. The most serious cases of viral gastroenteritis involve vomiting so severely that you are not able to keep fluids down. In these cases, fluids must be given through an intravenous line (IV). HOME CARE INSTRUCTIONS   Drink enough fluids to keep your urine clear or pale yellow. Drink small amounts of fluids frequently and increase the amounts as tolerated.  Ask your caregiver for specific rehydration instructions.  Avoid:  Foods high in sugar.  Alcohol.  Carbonated drinks.  Tobacco.  Juice.  Caffeine drinks.  Extremely hot or cold fluids.  Fatty, greasy foods.  Too much intake of anything at one time.  Dairy products until 24 to 48 hours after diarrhea stops.  You may consume probiotics.  Probiotics are active cultures of beneficial bacteria. They may lessen the amount and number of diarrheal stools in adults. Probiotics can be found in yogurt with active cultures and in supplements.  Wash your hands well to avoid spreading the virus.  Only take over-the-counter or prescription medicines for pain, discomfort, or fever as directed by your caregiver. Do not give aspirin to children. Antidiarrheal medicines are not recommended.  Ask your caregiver if you should continue to take your regular prescribed and over-the-counter medicines.  Keep all follow-up appointments as directed by your caregiver. SEEK IMMEDIATE MEDICAL CARE IF:   You are unable to keep fluids down.  You do not urinate at least once every 6 to 8 hours.  You develop shortness of breath.  You notice blood in your stool or vomit. This may look like coffee grounds.  You have abdominal pain that increases or is concentrated in one small area (localized).  You have persistent vomiting or diarrhea.  You have a fever.  The patient is a child younger than 3 months, and he or she has a fever.  The patient is a child older than 3 months, and he or she has a fever and persistent symptoms.  The patient is a child older than 3 months, and he or she has a fever and symptoms suddenly get worse.  The patient is a baby, and he or she has no tears when crying. MAKE SURE YOU:   Understand these instructions.  Will watch your condition.  Will get help right away if you are not doing well or get worse.   This information is not intended to replace  advice given to you by your health care provider. Make sure you discuss any questions you have with your health care provider.   Document Released: 03/17/2005 Document Revised: 06/09/2011 Document Reviewed: 01/01/2011 Elsevier Interactive Patient Education 2016 Elsevier Inc.   Dehydration, Adult Dehydration means your body does not have as much fluid or water as it  needs. It happens when you take in less fluid than you lose. Your kidneys, brain, and heart will not work properly without the right amount of fluids.  Dehydration can range from mild to severe. It should be treated right away to help prevent it from becoming severe. HOME CARE  Drink enough fluid to keep your pee (urine) clear or pale yellow.  Drink water or fluid slowly by taking small sips. You can also try sucking on ice cubes.  Have food or drinks that contain electrolytes. Examples include bananas and sports drinks.  Take over-the-counter and prescription medicines only as told by your doctor.  Prepare oral rehydration solution (ORS) according to the instructions that came with it. Take sips of ORS every 5 minutes until your pee returns to normal.  If you are throwing up (vomiting) or have watery poop (diarrhea), keep trying to drink water, ORS, or both.  If you have watery poop, avoid:  Drinks with caffeine.  Fruit juice.  Milk.  Carbonated soft drinks.  Do not take salt tablets. This can lead to having too much sodium in your body (hypernatremia). GET HELP IF:  You cannot eat or drink without throwing up.  You have had mild watery poop for longer than 24 hours.  You have a fever. GET HELP RIGHT AWAY IF:   You have very strong thirst.  You have very bad watery poop.  You have not peed in 6-8 hours, or you have peed only a small amount of very dark pee.  You have shriveled skin.  You are dizzy, confused, or both.   This information is not intended to replace advice given to you by your health care provider. Make sure you discuss any questions you have with your health care provider.   Document Released: 01/11/2009 Document Revised: 12/06/2014 Document Reviewed: 08/02/2014 Elsevier Interactive Patient Education [employer]

## 2015-04-30 NOTE — ED Notes (Signed)
Awake. Verbally responsive. A/O x4. Resp even and unlabored. No audible adventitious breath sounds noted. ABC's intact.  

## 2015-04-30 NOTE — ED Provider Notes (Signed)
CSN: [id number]     Arrival date & time 04/30/15  1117 History   First MD Initiated Contact with Patient 04/30/15 1236     Chief Complaint  Patient presents with  . Emesis  . Abdominal Pain     (Consider location/radiation/quality/duration/timing/severity/associated sxs/prior Treatment) HPI   25 year old female with history of insulin-dependent type 1 diabetic, gastroparesis, fibromyalgia presenting with complaint of abdominal pain. Patient reports she was awoke this morning with sharp stabbing epigastric abdominal pain. Pain is been persistent with associated lightheadedness, dizziness, she has vomited multiple times with nonbloody nonbilious vomitus, and having persistent nonbloody non-mucousy diarrhea. Patient felt this is likely food poisoning after eating a sandwich last night. Denies any recent sick contact or anyone else with similar symptoms. When she arrived she was tachycardic with a heart rate in the 150s. Patient denies fever, chills, headache, chest pain, shortness of breath, productive cough, dysuria, hematuria, vaginal bleeding, vaginal discharge, or rash. She has had prior cholecystectomy. She denies eating any exotic food or any recent sick contact.   Past Medical History  Diagnosis Date  . Hypertension   . Gastroparesis   . Anxiety   . Infection     UTI April 2016  . Fibromyalgia   . Diabetes mellitus   . Diabetes type 1, uncontrolled (HCC) 11/14/2011    Since age 72   Past Surgical History  Procedure Laterality Date  . Ankle surgery    . Cholecystectomy  11/15/2011    Procedure: LAPAROSCOPIC CHOLECYSTECTOMY WITH INTRAOPERATIVE CHOLANGIOGRAM;  Surgeon: [name], MD;  Location: [facility];  Service: General;  Laterality: N/A;  . Laparoscopy  11/23/2011    Procedure: LAPAROSCOPY DIAGNOSTIC;  Surgeon: Mariella Saa, MD;  Location: [facility];  Service: General;  Laterality: N/A;  . Esophagogastroduodenoscopy  12/03/2011    Procedure: ESOPHAGOGASTRODUODENOSCOPY  (EGD);  Surgeon: Theda Belfast, MD;  Location: [facility];  Service: Endoscopy;  Laterality: N/A;   Family History  Problem Relation Age of Onset  . Diabetes Mother   . Hypertension Father    Social History  Substance Use Topics  . Smoking status: Never Smoker   . Smokeless tobacco: Never Used  . Alcohol Use: No   OB History    Gravida Para Term Preterm AB TAB SAB Ectopic Multiple Living   1 1  1      0 1     Review of Systems  All other systems reviewed and are negative.     Allergies  Peanut-containing drug products and Lactose intolerance (gi)  Home Medications   Prior to Admission medications   Medication Sig Start Date End Date Taking? Authorizing Provider  butalbital-acetaminophen-caffeine (FIORICET) 50-325-40 MG per tablet Take 1-2 tablets by mouth every 6 (six) hours as needed for headache. 11/09/14 11/09/15  Armando Reichert, CNM  calcium carbonate (TUMS - DOSED IN MG ELEMENTAL CALCIUM) 500 MG chewable tablet Chew 2 tablets by mouth daily as needed for indigestion or heartburn.    Historical Provider, MD  Cholecalciferol (VITAMIN D PO) Take 2 each by mouth daily.     Historical Provider, MD  EPINEPHrine (EPIPEN 2-PAK) 0.3 mg/0.3 mL IJ SOAJ injection Inject 0.3 mLs (0.3 mg total) into the muscle once. 08/10/14   Rachelle A Denney, CNM  glucagon (GLUCAGON EMERGENCY) 1 MG injection Inject 1 mg into the vein once as needed (low blood sugar).    Historical Provider, MD  glucose 4 GM chewable tablet Chew 1 tablet (4 g total) by mouth as needed for  low blood sugar. 08/10/14   Rachelle A Denney, CNM  guaiFENesin-codeine 100-10 MG/5ML syrup Take 10 mLs by mouth every 8 (eight) hours as needed for cough. 04/29/15   Tilden Fossa, MD  hydrochlorothiazide (HYDRODIURIL) 25 MG tablet Take 1 tablet (25 mg total) by mouth daily. 01/29/15   Brock Bad, MD  ibuprofen (ADVIL,MOTRIN) 600 MG tablet Take 1 tablet (600 mg total) by mouth every 6 (six) hours as needed for mild pain.  01/29/15   Brock Bad, MD  insulin detemir (LEVEMIR) 100 UNIT/ML injection Inject 14 Units into the skin 2 (two) times daily.    Historical Provider, MD  insulin lispro (HUMALOG) 100 UNIT/ML injection Inject 2-12 Units into the skin 3 (three) times daily as needed for high blood sugar. Pt uses as needed per sliding scale.    Historical Provider, MD  ketotifen (ZADITOR) 0.025 % ophthalmic solution Place 1 drop into both eyes 2 (two) times daily as needed (dry eyes).    Historical Provider, MD  labetalol (NORMODYNE) 300 MG tablet Take 1 tablet (300 mg total) by mouth 3 (three) times daily. 01/29/15   Brock Bad, MD  Norethindrone Acetate-Ethinyl Estrad-FE (LOESTRIN 24 FE) 1-20 MG-MCG(24) tablet Take 1 tablet by mouth daily. 03/29/15   Brock Bad, MD  oxyCODONE-acetaminophen (PERCOCET/ROXICET) 5-325 MG tablet Take 2 tablets by mouth every 4 (four) hours as needed for moderate pain or severe pain (for pain scale greater than 7). Patient not taking: Reported on 03/29/2015 01/29/15   Brock Bad, MD  Prenat w/o A-FeCbn-Meth-FA-DHA (OB COMPLETE GOLD) 27.5-1-200 MG CAPS Take 1 tablet by mouth daily. Patient taking differently: Take 1 capsule by mouth daily.  09/15/14   Rachelle A Denney, CNM  ranitidine (ZANTAC) 150 MG capsule Take 1 capsule (150 mg total) by mouth 2 (two) times daily. 01/02/15   Brock Bad, MD   BP 105/89 mmHg  Pulse 134  Temp(Src) 97.5 F (36.4 C) (Oral)  Resp 21  SpO2 99%  LMP 04/29/2015 Physical Exam  Constitutional: She appears well-developed and well-nourished. She appears distressed.  African-American female, actively vomiting, appears uncomfortable.  HENT:  Head: Atraumatic.  Eyes: Conjunctivae are normal.  Neck: Neck supple.  Cardiovascular:  Tachycardia without murmurs rubs or gallops  Pulmonary/Chest: Effort normal and breath sounds normal.  Abdominal: Soft. There is tenderness (Diffuse abdominal tenderness most significant epigastric region  without guarding or rebound tenderness.).  Neurological: She is alert.  Skin: No rash noted.  Psychiatric: She has a normal mood and affect.  Nursing note and vitals reviewed.   ED Course  Procedures (including critical care time) Labs Review Labs Reviewed  COMPREHENSIVE METABOLIC PANEL - Abnormal; Notable for the following:    Potassium 3.1 (*)    CO2 15 (*)    Glucose, Bld 170 (*)    Total Protein 8.2 (*)    ALT 10 (*)    All other components within normal limits  URINALYSIS, ROUTINE W REFLEX MICROSCOPIC (NOT AT [facility]) - Abnormal; Notable for the following:    Specific Gravity, Urine 1.037 (*)    Glucose, UA >1000 (*)    Hgb urine dipstick TRACE (*)    Bilirubin Urine MODERATE (*)    Ketones, ur >80 (*)    Protein, ur 30 (*)    All other components within normal limits  URINE MICROSCOPIC-ADD ON - Abnormal; Notable for the following:    Squamous Epithelial / LPF 0-5 (*)    Bacteria, UA RARE (*)  Casts HYALINE CASTS (*)    All other components within normal limits  LIPASE, BLOOD  CBC  I-STAT BETA HCG BLOOD, ED ([facility], [facility], [facility] ONLY)    Imaging Review Dg Chest 2 View  04/29/2015  CLINICAL DATA:  Chest pain and shortness of breath for 2 days EXAM: CHEST  2 VIEW COMPARISON:  10/05/2013 FINDINGS: The heart size and mediastinal contours are within normal limits. Both lungs are clear. The visualized skeletal structures are unremarkable. IMPRESSION: No active cardiopulmonary disease. Electronically Signed   By: Alcide Clever M.D.   On: 04/29/2015 08:23   I have personally reviewed and evaluated these images and lab results as part of my medical decision-making.   EKG Interpretation None     ED ECG REPORT   Date: 04/30/2015  Rate: 158  Rhythm: sinus tachycardia  QRS Axis: normal  Intervals: QT prolonged  ST/T Wave abnormalities: normal  Conduction Disutrbances:none  Narrative Interpretation:   Old EKG Reviewed: none available  I have personally reviewed the EKG tracing and  agree with the computerized printout as noted.   MDM   Final diagnoses:  Nausea vomiting and diarrhea  Dehydration    BP 136/92 mmHg  Pulse 108  Temp(Src) 97.5 F (36.4 C) (Oral)  Resp 16  SpO2 98%  LMP 04/29/2015  Patient with history of diabetes here with abdominal pain nausea vomiting diarrhea. She is tachycardic and actively vomiting. Initial exam was limited due to patient's discomfort. IV fluid, pain medication and antinausea medication given. Will reassess.  3:25 PM After receiving IV fluid and treatment of the symptoms, patient was able to rest for a moderate amount of time and now felt better. Mild hypokalemia with potassium of 3.1, supplementation given. Mild hyperglycemia with a glucose of 170 but no evidence of DKA. Normal WBC.  Pt tolerates PO and stable for discharge.  Return precaution discussed.   Fayrene Helper, PA-C 04/30/15 1549  Nelva Nay, MD 04/30/15 [phone]

## 2015-04-30 NOTE — ED Notes (Signed)
Pt still in bathroom

## 2015-05-09 ENCOUNTER — Emergency Department (HOSPITAL_COMMUNITY)
Admission: EM | Admit: 2015-05-09 | Discharge: 2015-05-09 | Disposition: A | Discharge status: Home / Self Care | Payer: 59 | Attending: Emergency Medicine | Admitting: Emergency Medicine

## 2015-05-09 ENCOUNTER — Encounter (HOSPITAL_COMMUNITY): Payer: Self-pay | Admitting: *Deleted

## 2015-05-09 DIAGNOSIS — I10 Essential (primary) hypertension: Secondary | ICD-10-CM | POA: Insufficient documentation

## 2015-05-09 DIAGNOSIS — Z794 Long term (current) use of insulin: Secondary | ICD-10-CM | POA: Diagnosis not present

## 2015-05-09 DIAGNOSIS — Z8659 Personal history of other mental and behavioral disorders: Secondary | ICD-10-CM | POA: Diagnosis not present

## 2015-05-09 DIAGNOSIS — E109 Type 1 diabetes mellitus without complications: Secondary | ICD-10-CM | POA: Insufficient documentation

## 2015-05-09 DIAGNOSIS — Z3202 Encounter for pregnancy test, result negative: Secondary | ICD-10-CM | POA: Insufficient documentation

## 2015-05-09 DIAGNOSIS — Z87440 Personal history of urinary (tract) infections: Secondary | ICD-10-CM | POA: Diagnosis not present

## 2015-05-09 DIAGNOSIS — Z8739 Personal history of other diseases of the musculoskeletal system and connective tissue: Secondary | ICD-10-CM | POA: Diagnosis not present

## 2015-05-09 DIAGNOSIS — N390 Urinary tract infection, site not specified: Secondary | ICD-10-CM | POA: Diagnosis not present

## 2015-05-09 DIAGNOSIS — Z79899 Other long term (current) drug therapy: Secondary | ICD-10-CM | POA: Diagnosis not present

## 2015-05-09 DIAGNOSIS — R300 Dysuria: Secondary | ICD-10-CM | POA: Diagnosis present

## 2015-05-09 LAB — URINALYSIS, ROUTINE W REFLEX MICROSCOPIC
Glucose, UA: >1000 mg/dL — AB
Ketones, ur: NEGATIVE mg/dL
Nitrite: NEGATIVE
Protein, ur: 30 mg/dL — AB
Specific Gravity, Urine: 1.039 — ABNORMAL HIGH (ref 1.005–1.030)
pH: 6 (ref 5.0–8.0)

## 2015-05-09 LAB — URINE MICROSCOPIC-ADD ON: Squamous Epithelial / LPF: NONE SEEN

## 2015-05-09 LAB — POC URINE PREG, ED: Preg Test, Ur: NEGATIVE

## 2015-05-09 MED ORDER — CEPHALEXIN 500 MG PO CAPS: 500.0000 mg | ORAL_CAPSULE | Freq: Two times a day (BID) | ORAL | Status: DC

## 2015-05-09 MED ORDER — PHENAZOPYRIDINE HCL 200 MG PO TABS: 200.0000 mg | ORAL_TABLET | Freq: Three times a day (TID) | ORAL | Status: DC | PRN

## 2015-05-09 NOTE — Discharge Instructions (Signed)

## 2015-05-09 NOTE — ED Provider Notes (Signed)
CSN: [id number]     Arrival date & time 05/09/15  0600 History   First MD Initiated Contact with Patient 05/09/15 [phone]     Chief Complaint  Patient presents with  . Urinary Tract Infection     (Consider location/radiation/quality/duration/timing/severity/associated sxs/prior Treatment) HPI  This is a 25 year old female who is 3 months postpartum who presents with urinary frequency, dysuria 2-3 days. Patient reports she noted blood in her urine this evening. Denies any fevers or back pain. History of urinary tract infection and this feels the same. Denies any nausea, vomiting, or diarrhea. Denies any vaginal discharge. Reports that she just had her first menstrual period postpartum.  Past Medical History  Diagnosis Date  . Hypertension   . Gastroparesis   . Anxiety   . Infection     UTI April 2016  . Fibromyalgia   . Diabetes mellitus   . Diabetes type 1, uncontrolled (HCC) 11/14/2011    Since age 62   Past Surgical History  Procedure Laterality Date  . Ankle surgery    . Cholecystectomy  11/15/2011    Procedure: LAPAROSCOPIC CHOLECYSTECTOMY WITH INTRAOPERATIVE CHOLANGIOGRAM;  Surgeon: [name], MD;  Location: [facility];  Service: General;  Laterality: N/A;  . Laparoscopy  11/23/2011    Procedure: LAPAROSCOPY DIAGNOSTIC;  Surgeon: Mariella Saa, MD;  Location: [facility];  Service: General;  Laterality: N/A;  . Esophagogastroduodenoscopy  12/03/2011    Procedure: ESOPHAGOGASTRODUODENOSCOPY (EGD);  Surgeon: Theda Belfast, MD;  Location: [facility];  Service: Endoscopy;  Laterality: N/A;   Family History  Problem Relation Age of Onset  . Diabetes Mother   . Hypertension Father    Social History  Substance Use Topics  . Smoking status: Never Smoker   . Smokeless tobacco: Never Used  . Alcohol Use: 0.0 oz/week    0 Standard drinks or equivalent per week   OB History    Gravida Para Term Preterm AB TAB SAB Ectopic Multiple Living   1 1  1      0 1     Review of  Systems  Constitutional: Negative for fever.  Gastrointestinal: Negative for nausea and vomiting.  Genitourinary: Positive for dysuria, frequency and hematuria. Negative for vaginal bleeding and vaginal discharge.  All other systems reviewed and are negative.     Allergies  Peanut-containing drug products and Lactose intolerance (gi)  Home Medications   Prior to Admission medications   Medication Sig Start Date End Date Taking? Authorizing Provider  butalbital-acetaminophen-caffeine (FIORICET) 50-325-40 MG per tablet Take 1-2 tablets by mouth every 6 (six) hours as needed for headache. Patient not taking: Reported on 04/30/2015 11/09/14 11/09/15  Armando Reichert, CNM  calcium carbonate (TUMS - DOSED IN MG ELEMENTAL CALCIUM) 500 MG chewable tablet Chew 2 tablets by mouth daily as needed for indigestion or heartburn.    Historical Provider, MD  cephALEXin (KEFLEX) 500 MG capsule Take 1 capsule (500 mg total) by mouth 2 (two) times daily. 05/09/15   Shon Baton, MD  Cholecalciferol (VITAMIN D PO) Take 2 each by mouth daily.     Historical Provider, MD  EPINEPHrine (EPIPEN 2-PAK) 0.3 mg/0.3 mL IJ SOAJ injection Inject 0.3 mLs (0.3 mg total) into the muscle once. 08/10/14   Rachelle A Denney, CNM  glucagon (GLUCAGON EMERGENCY) 1 MG injection Inject 1 mg into the vein once as needed (low blood sugar).    Historical Provider, MD  glucose 4 GM chewable tablet Chew 1 tablet (4 g total) by mouth  as needed for low blood sugar. 08/10/14   Rachelle A Denney, CNM  guaiFENesin-codeine 100-10 MG/5ML syrup Take 10 mLs by mouth every 8 (eight) hours as needed for cough. 04/29/15   Tilden Fossa, MD  hydrochlorothiazide (HYDRODIURIL) 25 MG tablet Take 1 tablet (25 mg total) by mouth daily. 01/29/15   Brock Bad, MD  ibuprofen (ADVIL,MOTRIN) 600 MG tablet Take 1 tablet (600 mg total) by mouth every 6 (six) hours as needed for mild pain. Patient not taking: Reported on 04/30/2015 01/29/15   Brock Bad, MD  insulin glargine (LANTUS) 100 UNIT/ML injection Inject 40 Units into the skin daily.    Historical Provider, MD  insulin lispro (HUMALOG) 100 UNIT/ML injection Inject 2-12 Units into the skin 3 (three) times daily as needed for high blood sugar. Pt uses as needed per sliding scale.    Historical Provider, MD  ketotifen (ZADITOR) 0.025 % ophthalmic solution Place 1 drop into both eyes 2 (two) times daily as needed (dry eyes).    Historical Provider, MD  labetalol (NORMODYNE) 300 MG tablet Take 1 tablet (300 mg total) by mouth 3 (three) times daily. 01/29/15   Brock Bad, MD  Norethindrone Acetate-Ethinyl Estrad-FE (LOESTRIN 24 FE) 1-20 MG-MCG(24) tablet Take 1 tablet by mouth daily. 03/29/15   Brock Bad, MD  ondansetron (ZOFRAN) 4 MG tablet Take 1 tablet (4 mg total) by mouth every 8 (eight) hours as needed for nausea or vomiting. 04/30/15   Fayrene Helper, PA-C  oxyCODONE-acetaminophen (PERCOCET/ROXICET) 5-325 MG tablet Take 2 tablets by mouth every 4 (four) hours as needed for moderate pain or severe pain (for pain scale greater than 7). 01/29/15   Brock Bad, MD  phenazopyridine (PYRIDIUM) 200 MG tablet Take 1 tablet (200 mg total) by mouth 3 (three) times daily as needed for pain. 05/09/15   Shon Baton, MD  potassium chloride SA (K-DUR,KLOR-CON) 20 MEQ tablet Take 1 tablet (20 mEq total) by mouth daily. 04/30/15   Fayrene Helper, PA-C  Prenat w/o A-FeCbn-Meth-FA-DHA (OB COMPLETE GOLD) 27.5-1-200 MG CAPS Take 1 tablet by mouth daily. Patient taking differently: Take 1 capsule by mouth daily.  09/15/14   Rachelle A Denney, CNM  ranitidine (ZANTAC) 150 MG capsule Take 1 capsule (150 mg total) by mouth 2 (two) times daily. 01/02/15   Brock Bad, MD   BP 117/77 mmHg  Pulse 102  Temp(Src) 98.1 F (36.7 C) (Oral)  Resp 18  SpO2 100%  LMP 04/29/2015 Physical Exam  Constitutional: She is oriented to person, place, and time. She appears well-developed and well-nourished.  No distress.  HENT:  Head: Normocephalic and atraumatic.  Cardiovascular: Normal rate, regular rhythm and normal heart sounds.   No murmur heard. Pulmonary/Chest: Effort normal and breath sounds normal. No respiratory distress. She has no wheezes.  Abdominal: Soft. Bowel sounds are normal. There is no tenderness. There is no rebound.  Neurological: She is alert and oriented to person, place, and time.  Skin: Skin is warm and dry.  Psychiatric: She has a normal mood and affect.  Nursing note and vitals reviewed.   ED Course  Procedures (including critical care time) Labs Review Labs Reviewed  URINALYSIS, ROUTINE W REFLEX MICROSCOPIC (NOT AT [facility]) - Abnormal; Notable for the following:    APPearance TURBID (*)    Specific Gravity, Urine 1.039 (*)    Glucose, UA >1000 (*)    Hgb urine dipstick SMALL (*)    Bilirubin Urine SMALL (*)  Protein, ur 30 (*)    Leukocytes, UA MODERATE (*)    All other components within normal limits  URINE MICROSCOPIC-ADD ON - Abnormal; Notable for the following:    Bacteria, UA MANY (*)    Crystals CA OXALATE CRYSTALS (*)    All other components within normal limits  URINE CULTURE  POC URINE PREG, ED    Imaging Review No results found. I have personally rewith symptoms suspicious of urinary tract infection. Denies systemic complaints including fever and back pain. Urinalysis withviewed and evaluated these images and lab results as part of my medical decision-making.   EKG Interpretation None      MDM   Final diagnoses:  UTI (lower urinary tract infection)     Patient presents suspiscious for UTI.  No systemic complaints.  Well appearing.  Evidence of UTI in urinalysis.  WIll treat with keflex and pyridium.  After history, exam, and medical workup I feel the patient has been appropriately medically screened and is safe for discharge home. Pertinent diagnoses were discussed with the patient. Patient was given return  precautions.    Shon Baton, MD 05/09/15 [phone]

## 2015-05-09 NOTE — ED Notes (Signed)
Pt c/o burning upon urination that began yesterday afternoon; pt states that she has bladder pain; pt states that when she went to urinate this am it had blood in it

## 2015-05-09 NOTE — ED Notes (Signed)
Pt ambulated to restroom. 

## 2015-05-11 LAB — URINE CULTURE: Culture: >=100000

## 2015-05-12 ENCOUNTER — Telehealth (HOSPITAL_BASED_OUTPATIENT_CLINIC_OR_DEPARTMENT_OTHER): Payer: Self-pay | Admitting: Emergency Medicine

## 2015-05-12 NOTE — Telephone Encounter (Signed)
Post ED Visit - Positive Culture Follow-up: Successful Patient Follow-Up  Culture assessed and recommendations reviewed by: []  Enzo Bi, Pharm.D. []  Celedonio Miyamoto, Pharm.D., BCPS [x]  Garvin Fila, Pharm.D. []  Georgina Pillion, Pharm.D., BCPS []  [place], [address].D., BCPS, AAHIVP []  Estella Husk, Pharm.D., BCPS, AAHIVP []  Tennis Must, Pharm.D. []  Sherle Poe, [place].D.  Positive urine culture E.coli  []  Patient discharged without antimicrobial prescription and treatment is now indicated [x]  Organism is resistant to prescribed ED discharge antimicrobial []  Patient with positive blood cultures  Changes discussed with ED provider: Dierdre Forth PA New antibiotic prescription d/c keflex, start Bactrim DS 1 po bid x 3 days, if breast feeding must check with ID pharmacist before calling rx in  Attempting to contact patient   Berle Mull 05/12/2015, 2:41 PM

## 2015-05-12 NOTE — Progress Notes (Signed)
ED Antimicrobial Stewardship Positive Culture Follow Up   [name] is an 25 y.o. female who presented to [facility] on 05/09/2015 with a chief complaint of  Chief Complaint  Patient presents with  . Urinary Tract Infection    Recent Results (from the past 720 hour(s))  Urine culture     Status: None   Collection Time: 05/09/15  6:40 AM  Result Value Ref Range Status   Specimen Description URINE, CLEAN CATCH  Final   Special Requests NONE  Final   Culture   Final    >=100,000 COLONIES/mL ESCHERICHIA COLI Performed at [facility]    Report Status 05/11/2015 FINAL  Final   Organism ID, Bacteria ESCHERICHIA COLI  Final      Susceptibility   Escherichia coli - MIC*    AMPICILLIN >=32 RESISTANT Resistant     CEFAZOLIN >=64 RESISTANT Resistant     CEFTRIAXONE 16 INTERMEDIATE Intermediate     CIPROFLOXACIN <=0.25 SENSITIVE Sensitive     GENTAMICIN <=1 SENSITIVE Sensitive     IMIPENEM <=0.25 SENSITIVE Sensitive     NITROFURANTOIN <=16 SENSITIVE Sensitive     TRIMETH/SULFA <=20 SENSITIVE Sensitive     AMPICILLIN/SULBACTAM >=32 RESISTANT Resistant     PIP/TAZO 8 SENSITIVE Sensitive     * >=100,000 COLONIES/mL ESCHERICHIA COLI    [x]  Treated with cephalexin, organism resistant to prescribed antimicrobial []  Patient discharged originally without antimicrobial agent and treatment is now indicated  New antibiotic prescription: Bactrim DS 1 tab po bid x 3 days  ED Provider: Dahlia Client Muthersbaugh PA  Bertram Millard 05/12/2015, 8:44 AM Infectious Diseases Pharmacist Phone# [phone]

## 2015-05-24 ENCOUNTER — Encounter (HOSPITAL_COMMUNITY): Payer: Self-pay | Admitting: Emergency Medicine

## 2015-05-24 ENCOUNTER — Emergency Department (HOSPITAL_COMMUNITY)
Admission: EM | Admit: 2015-05-24 | Discharge: 2015-05-24 | Disposition: A | Discharge status: Home / Self Care | Payer: 59 | Attending: Emergency Medicine | Admitting: Emergency Medicine

## 2015-05-24 DIAGNOSIS — Z8659 Personal history of other mental and behavioral disorders: Secondary | ICD-10-CM | POA: Insufficient documentation

## 2015-05-24 DIAGNOSIS — R112 Nausea with vomiting, unspecified: Secondary | ICD-10-CM | POA: Insufficient documentation

## 2015-05-24 DIAGNOSIS — I10 Essential (primary) hypertension: Secondary | ICD-10-CM | POA: Insufficient documentation

## 2015-05-24 DIAGNOSIS — E109 Type 1 diabetes mellitus without complications: Secondary | ICD-10-CM | POA: Insufficient documentation

## 2015-05-24 DIAGNOSIS — Z8719 Personal history of other diseases of the digestive system: Secondary | ICD-10-CM | POA: Diagnosis not present

## 2015-05-24 DIAGNOSIS — N390 Urinary tract infection, site not specified: Secondary | ICD-10-CM

## 2015-05-24 DIAGNOSIS — Z794 Long term (current) use of insulin: Secondary | ICD-10-CM | POA: Insufficient documentation

## 2015-05-24 DIAGNOSIS — Z79899 Other long term (current) drug therapy: Secondary | ICD-10-CM | POA: Diagnosis not present

## 2015-05-24 DIAGNOSIS — Z8739 Personal history of other diseases of the musculoskeletal system and connective tissue: Secondary | ICD-10-CM | POA: Insufficient documentation

## 2015-05-24 DIAGNOSIS — R109 Unspecified abdominal pain: Secondary | ICD-10-CM

## 2015-05-24 DIAGNOSIS — Z792 Long term (current) use of antibiotics: Secondary | ICD-10-CM | POA: Insufficient documentation

## 2015-05-24 DIAGNOSIS — M545 Low back pain: Secondary | ICD-10-CM | POA: Diagnosis present

## 2015-05-24 MED ORDER — CIPROFLOXACIN HCL 500 MG PO TABS: 500.0000 mg | ORAL_TABLET | Freq: Two times a day (BID) | ORAL | Status: DC

## 2015-05-24 NOTE — ED Notes (Signed)
Per pt, states she finished antibiotics last Wednesday for UTI-started having lower back pain and dysuria last night

## 2015-05-24 NOTE — Discharge Instructions (Signed)
Pyelonephritis, Adult Pyelonephritis is a kidney infection. The kidneys are the organs that filter a person's blood and move waste out of the bloodstream and into the urine. Urine passes from the kidneys, through the ureters, and into the bladder. There are two main types of pyelonephritis:  Infections that come on quickly without any warning (acute pyelonephritis).  Infections that last for a long period of time (chronic pyelonephritis). In most cases, the infection clears up with treatment and does not cause further problems. More severe infections or chronic infections can sometimes spread to the bloodstream or lead to other problems with the kidneys. CAUSES This condition is usually caused by:  Bacteria traveling from the bladder to the kidney through infected urine. The urine in the bladder can become infected with bacteria from:  Bladder infection (cystitis).  Inflammation of the prostate gland (prostatitis).  Sexual intercourse, in females.  Bacteria traveling from the bloodstream to the kidney. RISK FACTORS This condition is more likely to develop in:  Pregnant women.  Older people.  People who have diabetes.  People who have kidney stones or bladder stones.  People who have other abnormalities of the kidney or ureter.  People who have a catheter placed in the bladder.  People who have cancer.  People who are sexually active.  Women who use spermicides.  People who have had a prior urinary tract infection. SYMPTOMS Symptoms of this condition include:  Frequent urination.  Strong or persistent urge to urinate.  Burning or stinging when urinating.  Abdominal pain.  Back pain.  Pain in the side or flank area.  Fever.  Chills.  Blood in the urine, or dark urine.  Nausea.  Vomiting. DIAGNOSIS This condition may be diagnosed based on:  Medical history and physical exam.  Urine tests.  Blood tests. You may also have imaging tests of the  kidneys, such as an ultrasound or CT scan. TREATMENT Treatment for this condition may depend on the severity of the infection.  If the infection is mild and is found early, you may be treated with antibiotic medicines taken by mouth. You will need to drink fluids to remain hydrated.  If the infection is more severe, you may need to stay in the hospital and receive antibiotics given directly into a vein through an IV tube. You may also need to receive fluids through an IV tube if you are not able to remain hydrated. After your hospital stay, you may need to take oral antibiotics for a period of time. Other treatments may be required, depending on the cause of the infection. HOME CARE INSTRUCTIONS Medicines  Take over-the-counter and prescription medicines only as told by your health care provider.  If you were prescribed an antibiotic medicine, take it as told by your health care provider. Do not stop taking the antibiotic even if you start to feel better. General Instructions  Drink enough fluid to keep your urine clear or pale yellow.  Avoid caffeine, tea, and carbonated beverages. They tend to irritate the bladder.  Urinate often. Avoid holding in urine for long periods of time.  Urinate before and after sex.  After a bowel movement, women should cleanse from front to back. Use each tissue only once.  Keep all follow-up visits as told by your health care provider. This is important. SEEK MEDICAL CARE IF:  Your symptoms do not get better after 2 days of treatment.  Your symptoms get worse.  You have a fever. SEEK IMMEDIATE MEDICAL CARE IF:  You   are unable to take your antibiotics or fluids.  You have shaking chills.  You vomit.  You have severe flank or back pain.  You have extreme weakness or fainting.   This information is not intended to replace advice given to you by your health care provider. Make sure you discuss any questions you have with your health care  provider.   Document Released: 03/17/2005 Document Revised: 12/06/2014 Document Reviewed: 07/10/2014 Elsevier Interactive Patient Education 2016 [employer]  

## 2015-05-24 NOTE — ED Provider Notes (Signed)
History  By signing my name below, I, Jennifer Tensley, attest that this documentation has been prepared under the direction and in the presence of Rob Mantee, [place]. Electronically Signed: Karle Plumber, ED Scribe. 05/24/2015. 8:26 PM.  Chief Complaint  Patient presents with  . Back Pain   The history is provided by the patient and medical records. No language interpreter was used.    HPI Comments:  JOHNANNA [name] is a 25 y.o. female who presents to the Emergency Department complaining of low back pain and flank pain that began this morning. She reports two episodes of vomiting yesterday, dysuria and malodorous urine. She has not taken anything to treat her symptoms. She denies modifying factors. She denies nausea, fever, chills, hematuria. Pt states she recently finished a course of Cephalexin for a UTI (prescribed 05/09/15).  Past Medical History  Diagnosis Date  . Hypertension   . Gastroparesis   . Anxiety   . Infection     UTI April 2016  . Fibromyalgia   . Diabetes mellitus   . Diabetes type 1, uncontrolled (HCC) 11/14/2011    Since age 50   Past Surgical History  Procedure Laterality Date  . Ankle surgery    . Cholecystectomy  11/15/2011    Procedure: LAPAROSCOPIC CHOLECYSTECTOMY WITH INTRAOPERATIVE CHOLANGIOGRAM;  Surgeon: [name], MD;  Location: [facility];  Service: General;  Laterality: N/A;  . Laparoscopy  11/23/2011    Procedure: LAPAROSCOPY DIAGNOSTIC;  Surgeon: Mariella Saa, MD;  Location: [facility];  Service: General;  Laterality: N/A;  . Esophagogastroduodenoscopy  12/03/2011    Procedure: ESOPHAGOGASTRODUODENOSCOPY (EGD);  Surgeon: Theda Belfast, MD;  Location: [facility];  Service: Endoscopy;  Laterality: N/A;   Family History  Problem Relation Age of Onset  . Diabetes Mother   . Hypertension Father    Social History  Substance Use Topics  . Smoking status: Never Smoker   . Smokeless tobacco: Never Used  . Alcohol Use: 0.0 oz/week    0  Standard drinks or equivalent per week   OB History    Gravida Para Term Preterm AB TAB SAB Ectopic Multiple Living   0 1     Review of Systems  Constitutional: Negative for fever and chills.  Gastrointestinal: Positive for vomiting. Negative for nausea.  Genitourinary: Positive for dysuria and flank pain. Negative for hematuria.  Musculoskeletal: Positive for back pain.    Allergies  Peanut-containing drug products and Lactose intolerance (gi)  Home Medications   Prior to Admission medications   Medication Sig Start Date End Date Taking? Authorizing Provider  butalbital-acetaminophen-caffeine (FIORICET) 50-325-40 MG per tablet Take 1-2 tablets by mouth every 6 (six) hours as needed for headache. Patient not taking: Reported on 04/30/2015 11/09/14 11/09/15  Armando Reichert, CNM  calcium carbonate (TUMS - DOSED IN MG ELEMENTAL CALCIUM) 500 MG chewable tablet Chew 2 tablets by mouth daily as needed for indigestion or heartburn.    Historical Provider, MD  cephALEXin (KEFLEX) 500 MG capsule Take 1 capsule (500 mg total) by mouth 2 (two) times daily. 05/09/15   Shon Baton, MD  Cholecalciferol (VITAMIN D PO) Take 2 each by mouth daily.     Historical Provider, MD  EPINEPHrine (EPIPEN 2-PAK) 0.3 mg/0.3 mL IJ SOAJ injection Inject 0.3 mLs (0.3 mg total) into the muscle once. 08/10/14   Rachelle A Denney, CNM  glucagon (GLUCAGON EMERGKarle Plumberjection Inject 1 mg into the vein once as  needed (low blood sugar).    Historical Provider, MD  glucose 4 GM chewable tablet Chew 1 tablet (4 g total) by mouth as needed for low blood sugar. 08/10/14   Rachelle A Denney, CNM  guaiFENesin-codeine 100-10 MG/5ML syrup Take 10 mLs by mouth every 8 (eight) hours as needed for cough. 04/29/15   Tilden Fossa, MD  hydrochlorothiazide (HYDRODIURIL) 25 MG tablet Take 1 tablet (25 mg total) by mouth daily. 01/29/15   Brock Bad, MD  ibuprofen (ADVIL,MOTRIN) 600 MG tablet Take 1 tablet (600 mg  total) by mouth every 6 (six) hours as needed for mild pain. Patient not taking: Reported on 04/30/2015 01/29/15   Brock Bad, MD  insulin glargine (LANTUS) 100 UNIT/ML injection Inject 40 Units into the skin daily.    Historical Provider, MD  insulin lispro (HUMALOG) 100 UNIT/ML injection Inject 2-12 Units into the skin 3 (three) times daily as needed for high blood sugar. Pt uses as needed per sliding scale.    Historical Provider, MD  ketotifen (ZADITOR) 0.025 % ophthalmic solution Place 1 drop into both eyes 2 (two) times daily as needed (dry eyes).    Historical Provider, MD  labetalol (NORMODYNE) 300 MG tablet Take 1 tablet (300 mg total) by mouth 3 (three) times daily. 01/29/15   Brock Bad, MD  Norethindrone Acetate-Ethinyl Estrad-FE (LOESTRIN 24 FE) 1-20 MG-MCG(24) tablet Take 1 tablet by mouth daily. 03/29/15   Brock Bad, MD  ondansetron (ZOFRAN) 4 MG tablet Take 1 tablet (4 mg total) by mouth every 8 (eight) hours as needed for nausea or vomiting. 04/30/15   Fayrene Helper, PA-C  oxyCODONE-acetaminophen (PERCOCET/ROXICET) 5-325 MG tablet Take 2 tablets by mouth every 4 (four) hours as needed for moderate pain or severe pain (for pain scale greater than 7). 01/29/15   Brock Bad, MD  phenazopyridine (PYRIDIUM) 200 MG tablet Take 1 tablet (200 mg total) by mouth 3 (three) times daily as needed for pain. 05/09/15   Shon Baton, MD  potassium chloride SA (K-DUR,KLOR-CON) 20 MEQ tablet Take 1 tablet (20 mEq total) by mouth daily. 04/30/15   Fayrene Helper, PA-C  Prenat w/o A-FeCbn-Meth-FA-DHA (OB COMPLETE GOLD) 27.5-1-200 MG CAPS Take 1 tablet by mouth daily. Patient taking differently: Take 1 capsule by mouth daily.  09/15/14   Rachelle A Denney, CNM  ranitidine (ZANTAC) 150 MG capsule Take 1 capsule (150 mg total) by mouth 2 (two) times daily. 01/02/15   Brock Bad, MD   Triage Vitals: BP 142/91 mmHg  Pulse 109  Temp(Src) 98.1 F (36.7 C) (Oral)  Resp 16  SpO2 100%   LMP 04/29/2015 Physical Exam  Constitutional: She is oriented to person, place, and time. She appears well-developed and well-nourished.  HENT:  Head: Normocephalic and atraumatic.  Eyes: EOM are normal.  Neck: Normal range of motion.  Cardiovascular: Normal rate.   Pulmonary/Chest: Effort normal.  Abdominal: Soft. She exhibits no distension and no mass. There is tenderness (mild suprapubic tenderness). There is no rebound and no guarding.  Musculoskeletal: Normal range of motion.  Neurological: She is alert and oriented to person, place, and time.  Skin: Skin is warm and dry.  Psychiatric: She has a normal mood and affect. Her behavior is normal.  Nursing note and vitals reviewed.   ED Course  Procedures (including critical care time) DIAGNOSTIC STUDIES: Oxygen Saturation is 100% on RA, normal by my interpretation.   COORDINATION OF CARE: 8:23 PM- Reviewed urine cultures from last visit.  Will prescribe Cipro. Pt verbalizes understanding and agrees to plan.    MDM   Final diagnoses:  UTI (lower urinary tract infection)  Flank pain    Patient with recent UTI. She states that she has had persistent symptoms, and is starting to have left-sided flank pain. She does report some nausea and vomiting, but denies any fever. Recent urine cultures reviewed, which shows intermediate sensitivity to Keflex, I will switch the patient to Cipro and treat for 14 days for early pyelonephritis. Cipro has good sensitivity per recent urine culture. Patient is stable and ready for discharge.  I personally performed the services described in this documentation, which was scribed in my presence. The recorded information has been reviewed and is accurate.       Roxy Horseman, PA-C 05/24/15 2031  Tilden Fossa, MD 05/25/15 [phone]

## 2015-09-14 ENCOUNTER — Encounter (HOSPITAL_COMMUNITY): Payer: Self-pay | Admitting: *Deleted

## 2015-09-14 ENCOUNTER — Emergency Department (HOSPITAL_COMMUNITY)
Admission: EM | Admit: 2015-09-14 | Discharge: 2015-09-14 | Disposition: A | Discharge status: Home / Self Care | Payer: 59 | Attending: Emergency Medicine | Admitting: Emergency Medicine

## 2015-09-14 DIAGNOSIS — R1013 Epigastric pain: Secondary | ICD-10-CM | POA: Diagnosis present

## 2015-09-14 DIAGNOSIS — I10 Essential (primary) hypertension: Secondary | ICD-10-CM | POA: Diagnosis not present

## 2015-09-14 DIAGNOSIS — E1065 Type 1 diabetes mellitus with hyperglycemia: Secondary | ICD-10-CM | POA: Insufficient documentation

## 2015-09-14 DIAGNOSIS — R197 Diarrhea, unspecified: Secondary | ICD-10-CM

## 2015-09-14 DIAGNOSIS — E1043 Type 1 diabetes mellitus with diabetic autonomic (poly)neuropathy: Secondary | ICD-10-CM | POA: Diagnosis not present

## 2015-09-14 DIAGNOSIS — R112 Nausea with vomiting, unspecified: Secondary | ICD-10-CM | POA: Diagnosis not present

## 2015-09-14 DIAGNOSIS — Z79899 Other long term (current) drug therapy: Secondary | ICD-10-CM | POA: Insufficient documentation

## 2015-09-14 DIAGNOSIS — Z794 Long term (current) use of insulin: Secondary | ICD-10-CM | POA: Insufficient documentation

## 2015-09-14 DIAGNOSIS — R739 Hyperglycemia, unspecified: Secondary | ICD-10-CM

## 2015-09-14 LAB — CBC
HCT: 35.4 % — ABNORMAL LOW (ref 36.0–46.0)
Hemoglobin: 12.9 g/dL (ref 12.0–15.0)
MCH: 30.7 pg (ref 26.0–34.0)
MCHC: 36.4 g/dL — ABNORMAL HIGH (ref 30.0–36.0)
MCV: 84.3 fL (ref 78.0–100.0)
Platelets: 235 10*3/uL (ref 150–400)
RBC: 4.2 MIL/uL (ref 3.87–5.11)
RDW: 12 % (ref 11.5–15.5)
WBC: 3.9 10*3/uL — ABNORMAL LOW (ref 4.0–10.5)

## 2015-09-14 LAB — CBG MONITORING, ED
Glucose-Capillary: 420 mg/dL — ABNORMAL HIGH (ref 65–99)
Glucose-Capillary: 146 mg/dL — ABNORMAL HIGH (ref 65–99)

## 2015-09-14 LAB — COMPREHENSIVE METABOLIC PANEL
ALT: 17 U/L (ref 14–54)
AST: 29 U/L (ref 15–41)
Albumin: 3.8 g/dL (ref 3.5–5.0)
Alkaline Phosphatase: 116 U/L (ref 38–126)
Anion gap: 6 (ref 5–15)
BUN: 14 mg/dL (ref 6–20)
CO2: 28 mmol/L (ref 22–32)
Calcium: 9.1 mg/dL (ref 8.9–10.3)
Chloride: 101 mmol/L (ref 101–111)
Creatinine, Ser: 0.62 mg/dL (ref 0.44–1.00)
GFR calc Af Amer: >60 mL/min (ref >60)
GFR calc non Af Amer: >60 mL/min (ref >60)
Glucose, Bld: 414 mg/dL — ABNORMAL HIGH (ref 65–99)
Potassium: 3.7 mmol/L (ref 3.5–5.1)
Sodium: 135 mmol/L (ref 135–145)
Total Bilirubin: 1.2 mg/dL (ref 0.3–1.2)
Total Protein: 7.8 g/dL (ref 6.5–8.1)

## 2015-09-14 LAB — URINE MICROSCOPIC-ADD ON

## 2015-09-14 LAB — URINALYSIS, ROUTINE W REFLEX MICROSCOPIC
Bilirubin Urine: NEGATIVE
Glucose, UA: >1000 mg/dL — AB
Ketones, ur: NEGATIVE mg/dL
Leukocytes, UA: NEGATIVE
Nitrite: NEGATIVE
Protein, ur: NEGATIVE mg/dL
Specific Gravity, Urine: 1.042 — ABNORMAL HIGH (ref 1.005–1.030)
pH: 5.5 (ref 5.0–8.0)

## 2015-09-14 LAB — LIPASE, BLOOD: Lipase: 76 U/L — ABNORMAL HIGH (ref 11–51)

## 2015-09-14 LAB — I-STAT BETA HCG BLOOD, ED (MC, WL, AP ONLY): I-stat hCG, quantitative: <5 m[IU]/mL (ref <5)

## 2015-09-14 MED ORDER — ONDANSETRON HCL 4 MG/2ML IJ SOLN
4.0000 mg | Freq: Once | INTRAMUSCULAR | Status: AC | PRN
  Administered 2015-09-14: 4 mg via INTRAVENOUS
  Filled 2015-09-14: qty 2

## 2015-09-14 MED ORDER — MORPHINE SULFATE (PF) 4 MG/ML IV SOLN
4.0000 mg | Freq: Once | INTRAVENOUS | Status: AC
Start: 2015-09-14 — End: 2015-09-14
  Administered 2015-09-14: 4 mg via INTRAVENOUS
  Filled 2015-09-14: qty 1

## 2015-09-14 MED ORDER — MORPHINE SULFATE (PF) 4 MG/ML IV SOLN
4.0000 mg | Freq: Once | INTRAVENOUS | Status: AC
  Administered 2015-09-14: 4 mg via INTRAVENOUS
  Filled 2015-09-14: qty 1

## 2015-09-14 MED ORDER — METOCLOPRAMIDE HCL 10 MG PO TABS: 10.0000 mg | ORAL_TABLET | Freq: Four times a day (QID) | ORAL | Status: DC

## 2015-09-14 MED ORDER — OXYCODONE-ACETAMINOPHEN 5-325 MG PO TABS: 2.0000 | ORAL_TABLET | ORAL | Status: DC | PRN

## 2015-09-14 MED ORDER — SODIUM CHLORIDE 0.9 % IV BOLUS (SEPSIS)
1000.0000 mL | Freq: Once | INTRAVENOUS | Status: AC
  Administered 2015-09-14: 1000 mL via INTRAVENOUS

## 2015-09-14 MED ORDER — INSULIN ASPART 100 UNIT/ML ~~LOC~~ SOLN
5.0000 [IU] | Freq: Once | SUBCUTANEOUS | Status: AC
  Administered 2015-09-14: 5 [IU] via INTRAVENOUS
  Filled 2015-09-14: qty 1

## 2015-09-14 MED ORDER — METOCLOPRAMIDE HCL 5 MG/ML IJ SOLN
10.0000 mg | Freq: Once | INTRAMUSCULAR | Status: AC
  Administered 2015-09-14: 10 mg via INTRAVENOUS
  Filled 2015-09-14: qty 2

## 2015-09-14 NOTE — ED Provider Notes (Signed)
CSN: [id number]     Arrival date & time 09/14/15  8657 History   First MD Initiated Contact with Patient 09/14/15 0715     Chief Complaint  Patient presents with  . Abdominal Pain     (Consider location/radiation/quality/duration/timing/severity/associated sxs/prior Treatment) HPI [name] is a 25 y.o. female with PMH significant for HTN, gastroparesis, DM, fibromyalgia who presents with sudden onset, intermittent, moderate, nonbloody diarrhea that began approximately 9 PM yesterday evening.  She reports 6 episodes of diarrhea.  She then began experiencing non-bloody emesis, and reports 3 episodes.  Associated symptoms include non-radiating, epigastric, burning abdominal pain.  No aggravating factors.  She has tried immodium without relief.  Denies fever, chills, CP, SOB, hematemesis, vaginal discharge, urinary symptoms, melena, or hematochezia. She states she ate some macaroni and cheese last night and then her symptoms began. She states she has been compliant with her DM medications, lantus and humalog.  Past Medical History  Diagnosis Date  . Hypertension   . Gastroparesis   . Anxiety   . Infection     UTI April 2016  . Fibromyalgia   . Diabetes mellitus   . Diabetes type 1, uncontrolled (HCC) 11/14/2011    Since age 79   Past Surgical History  Procedure Laterality Date  . Ankle surgery    . Cholecystectomy  11/15/2011    Procedure: LAPAROSCOPIC CHOLECYSTECTOMY WITH INTRAOPERATIVE CHOLANGIOGRAM;  Surgeon: [name], MD;  Location: [facility];  Service: General;  Laterality: N/A;  . Laparoscopy  11/23/2011    Procedure: LAPAROSCOPY DIAGNOSTIC;  Surgeon: Mariella Saa, MD;  Location: [facility];  Service: General;  Laterality: N/A;  . Esophagogastroduodenoscopy  12/03/2011    Procedure: ESOPHAGOGASTRODUODENOSCOPY (EGD);  Surgeon: Theda Belfast, MD;  Location: [facility];  Service: Endoscopy;  Laterality: N/A;   Family History  Problem Relation Age of Onset  . Diabetes  Mother   . Hypertension Father    Social History  Substance Use Topics  . Smoking status: Never Smoker   . Smokeless tobacco: Never Used  . Alcohol Use: 0.0 oz/week    0 Standard drinks or equivalent per week   OB History    Gravida Para Term Preterm AB TAB SAB Ectopic Multiple Living   0 1     Review of Systems All other systems negative unless otherwise stated in HPI    Allergies  Peanut-containing drug products and Lactose intolerance (gi)  Home Medications   Prior to Admission medications   Medication Sig Start Date End Date Taking? Authorizing Provider  albuterol (PROVENTIL HFA;VENTOLIN HFA) 108 (90 Base) MCG/ACT inhaler Inhale 2 puffs into the lungs every 6 (six) hours as needed for wheezing or shortness of breath.   Yes Historical Provider, MD  amoxicillin (AMOXIL) 500 MG capsule Take 500 mg by mouth every 8 (eight) hours. Started on 6/10. 7-day therapy.   Yes Historical Provider, MD  bismuth subsalicylate (PEPTO BISMOL) 262 MG chewable tablet Chew 524 mg by mouth daily as needed for diarrhea or loose stools.   Yes Historical Provider, MD  calcium carbonate (TUMS - DOSED IN MG ELEMENTAL CALCIUM) 500 MG chewable tablet Chew 2 tablets by mouth daily as needed for indigestion or heartburn.   Yes Historical Provider, MD  Cholecalciferol 400 units CHEW Chew 800 Units by mouth daily.   Yes Historical Provider, MD  glucose 4 GM chewable tablet Chew 1 tablet (4 g total) by mouth as needed for low  blood sugar. 08/10/14  Yes Rachelle A Denney, CNM  Ibuprofen (MIDOL) 200 MG CAPS Take 400 mg by mouth daily as needed (pain).   Yes Historical Provider, MD  insulin glargine (LANTUS) 100 UNIT/ML injection Inject 16 Units into the skin 2 (two) times daily. Sometimes has to adjust based on BG levels   Yes Historical Provider, MD  insulin lispro (HUMALOG) 100 UNIT/ML injection Inject 8 Units into the skin 3 (three) times daily as needed for high blood sugar. Patient uses sliding  scale.  Always uses 8 units but could be more.   Yes Historical Provider, MD  ketotifen (ZADITOR) 0.025 % ophthalmic solution Place 1 drop into both eyes 2 (two) times daily as needed (dry eyes).   Yes Historical Provider, MD  Loperamide HCl (IMODIUM A-D) 1 MG/7.5ML LIQD Take 1 mg by mouth daily as needed (diarrhea).   Yes Historical Provider, MD  ondansetron (ZOFRAN) 4 MG tablet Take 1 tablet (4 mg total) by mouth every 8 (eight) hours as needed for nausea or vomiting. 04/30/15  Yes Fayrene Helper, PA-C  butalbital-acetaminophen-caffeine (FIORICET) 50-325-40 MG per tablet Take 1-2 tablets by mouth every 6 (six) hours as needed for headache. Patient not taking: Reported on 04/30/2015 11/09/14 11/09/15  Armando Reichert, CNM  cephALEXin (KEFLEX) 500 MG capsule Take 1 capsule (500 mg total) by mouth 2 (two) times daily. Patient not taking: Reported on 09/14/2015 05/09/15   Shon Baton, MD  ciprofloxacin (CIPRO) 500 MG tablet Take 1 tablet (500 mg total) by mouth 2 (two) times daily. Patient not taking: Reported on 09/14/2015 05/24/15   Roxy Horseman, PA-C  EPINEPHrine (EPIPEN 2-PAK) 0.3 mg/0.3 mL IJ SOAJ injection Inject 0.3 mLs (0.3 mg total) into the muscle once. 08/10/14   Rachelle A Denney, CNM  guaiFENesin-codeine 100-10 MG/5ML syrup Take 10 mLs by mouth every 8 (eight) hours as needed for cough. 04/29/15   Tilden Fossa, MD  hydrochlorothiazide (HYDRODIURIL) 25 MG tablet Take 1 tablet (25 mg total) by mouth daily. Patient taking differently: Take 25 mg by mouth daily as needed.  01/29/15   Brock Bad, MD  ibuprofen (ADVIL,MOTRIN) 600 MG tablet Take 1 tablet (600 mg total) by mouth every 6 (six) hours as needed for mild pain. Patient not taking: Reported on 04/30/2015 01/29/15   Brock Bad, MD  labetalol (NORMODYNE) 300 MG tablet Take 1 tablet (300 mg total) by mouth 3 (three) times daily. 01/29/15   Brock Bad, MD  Norethindrone Acetate-Ethinyl Estrad-FE (LOESTRIN 24 FE) 1-20  MG-MCG(24) tablet Take 1 tablet by mouth daily. Patient not taking: Reported on 09/14/2015 03/29/15   Brock Bad, MD  oxyCODONE-acetaminophen (PERCOCET/ROXICET) 5-325 MG tablet Take 2 tablets by mouth every 4 (four) hours as needed for moderate pain or severe pain (for pain scale greater than 7). Patient not taking: Reported on 09/14/2015 01/29/15   Brock Bad, MD  phenazopyridine (PYRIDIUM) 200 MG tablet Take 1 tablet (200 mg total) by mouth 3 (three) times daily as needed for pain. Patient not taking: Reported on 09/14/2015 05/09/15   Shon Baton, MD  potassium chloride SA (K-DUR,KLOR-CON) 20 MEQ tablet Take 1 tablet (20 mEq total) by mouth daily. Patient not taking: Reported on 09/14/2015 04/30/15   Fayrene Helper, PA-C  Prenat w/o A-FeCbn-Meth-FA-DHA (OB COMPLETE GOLD) 27.5-1-200 MG CAPS Take 1 tablet by mouth daily. Patient not taking: Reported on 09/14/2015 09/15/14   Rachelle A Denney, CNM  ranitidine (ZANTAC) 150 MG capsule Take 1 capsule (150 mg  total) by mouth 2 (two) times daily. Patient not taking: Reported on 09/14/2015 01/02/15   Brock Bad, MD   BP 127/78 mmHg  Pulse 89  Temp(Src) 97.9 F (36.6 C) (Oral)  Resp 18  Ht 5\' 7"  (1.702 m)  Wt 72.576 kg  BMI 25.05 kg/m2  SpO2 97%  LMP 09/05/2015 Physical Exam  Constitutional: She is oriented to person, place, and time. She appears well-developed and well-nourished.  Non-toxic appearance. She does not have a sickly appearance. She does not appear ill.  HENT:  Head: Normocephalic and atraumatic.  Mouth/Throat: Oropharynx is clear and moist.  Eyes: Conjunctivae are normal.  Neck: Normal range of motion. Neck supple.  Cardiovascular: Regular rhythm.  Tachycardia present.   Pulmonary/Chest: Effort normal and breath sounds normal. No accessory muscle usage or stridor. No respiratory distress. She has no wheezes. She has no rhonchi. She has no rales.  Abdominal: Soft. Bowel sounds are normal. She exhibits no distension.  There is tenderness. There is no rigidity, no rebound and no guarding.  Diffuse abdominal tenderness with epigastrium being the most tender.  Musculoskeletal: Normal range of motion.  Lymphadenopathy:    She has no cervical adenopathy.  Neurological: She is alert and oriented to person, place, and time.  Speech clear without dysarthria.  Skin: Skin is warm and dry.  Psychiatric: She has a normal mood and affect. Her behavior is normal.    ED Course  Procedures (including critical care time) Labs Review Labs Reviewed  LIPASE, BLOOD - Abnormal; Notable for the following:    Lipase 76 (*)    All other components within normal limits  COMPREHENSIVE METABOLIC PANEL - Abnormal; Notable for the following:    Glucose, Bld 414 (*)    All other components within normal limits  CBC - Abnormal; Notable for the following:    WBC 3.9 (*)    HCT 35.4 (*)    MCHC 36.4 (*)    All other components within normal limits  URINALYSIS, ROUTINE W REFLEX MICROSCOPIC (NOT AT [facility]) - Abnormal; Notable for the following:    Specific Gravity, Urine 1.042 (*)    Glucose, UA >1000 (*)    Hgb urine dipstick SMALL (*)    All other components within normal limits  URINE MICROSCOPIC-ADD ON - Abnormal; Notable for the following:    Squamous Epithelial / LPF 0-5 (*)    Bacteria, UA RARE (*)    Casts HYALINE CASTS (*)    All other components within normal limits  CBG MONITORING, ED - Abnormal; Notable for the following:    Glucose-Capillary 420 (*)    All other components within normal limits  CBG MONITORING, ED - Abnormal; Notable for the following:    Glucose-Capillary 146 (*)    All other components within normal limits  I-STAT BETA HCG BLOOD, ED ([facility], [facility], [facility] ONLY)    Imaging Review No results found. I have personally reviewed and evaluated these images and lab results as part of my medical decision-making.   EKG Interpretation   Date/Time:  Friday September 14 2015 07:33:37 EDT Ventricular Rate:   107 PR Interval:  143 QRS Duration: 81 QT Interval:  343 QTC Calculation: 458 R Axis:   54 Text Interpretation:  Sinus tachycardia Borderline Q waves in inferior  leads No significant change since last tracing Abnormal ekg Confirmed by  Gerhard Munch  MD [phone]) on 09/14/2015 11:58:00 AM      MDM   Final diagnoses:  Hyperglycemia  Non-intractable vomiting  with nausea, vomiting of unspecified type  Diarrhea, unspecified type  Epigastric pain   Patient with hx of HTN, DM, gastroparesis presents with N/V/D and epigastric abdominal pain.  She is hypertensive, 170/117 and tachycardic with HR 114.  On exam, heart sounds normal, lungs CTAB, abdomen soft with generalized tenderness.  No rebound, guarding, or rigidity.  DDx includes DKA, enteritis, gastroparesis.  Will obtain labs, give IVF, morphine, and zofran.  Labs show lipase 76, glucose 420; otherwise, without acute abnormalities.  No evidence of DKA.  Patient received 5U insulin, CBG recheck 147. UA without signs of infection, no ketones, there is glycosuria. Upon recheck, patient reports mild improvement of symptoms.  Will give morphine and reglan.  Tachycardia has resolved.  BP 85/90.  Patient given 2L NS bolus. Upon reassessment, BP 127/78.  Patient able to tolerate PO intake without difficulty.  Discharge home with reglan and percocet.  Follow up GI.  Discussed return precautions.  Patient agrees and acknowledges the above plan for discharge.       Cheri Fowler, PA-C 09/14/15 1336  Gerhard Munch, MD 09/14/15 1540

## 2015-09-14 NOTE — Discharge Instructions (Signed)

## 2015-09-14 NOTE — ED Notes (Addendum)
Pt drank sprite zero and ate crackers, peanut butter with no difficulty or emesis.

## 2015-09-14 NOTE — ED Notes (Signed)
Pt reports that she began having upper abd pain last night; pt c/o nausea with 2 episodes of vomiting; pt c/o multiple episodes of diarrhea; pt states that the diarrhea has gotten progressively worse as the night has progressed

## 2015-09-15 ENCOUNTER — Encounter (HOSPITAL_COMMUNITY): Payer: Self-pay

## 2015-09-15 ENCOUNTER — Emergency Department (HOSPITAL_COMMUNITY)
Admission: EM | Admit: 2015-09-15 | Discharge: 2015-09-15 | Disposition: A | Discharge status: Home / Self Care | Payer: 59 | Attending: Emergency Medicine | Admitting: Emergency Medicine

## 2015-09-15 DIAGNOSIS — Z792 Long term (current) use of antibiotics: Secondary | ICD-10-CM | POA: Diagnosis not present

## 2015-09-15 DIAGNOSIS — Z791 Long term (current) use of non-steroidal anti-inflammatories (NSAID): Secondary | ICD-10-CM | POA: Insufficient documentation

## 2015-09-15 DIAGNOSIS — G43009 Migraine without aura, not intractable, without status migrainosus: Secondary | ICD-10-CM

## 2015-09-15 DIAGNOSIS — I10 Essential (primary) hypertension: Secondary | ICD-10-CM | POA: Insufficient documentation

## 2015-09-15 DIAGNOSIS — G43909 Migraine, unspecified, not intractable, without status migrainosus: Secondary | ICD-10-CM | POA: Diagnosis not present

## 2015-09-15 DIAGNOSIS — Z79899 Other long term (current) drug therapy: Secondary | ICD-10-CM | POA: Insufficient documentation

## 2015-09-15 DIAGNOSIS — E109 Type 1 diabetes mellitus without complications: Secondary | ICD-10-CM | POA: Diagnosis not present

## 2015-09-15 LAB — I-STAT CHEM 8, ED
BUN: 8 mg/dL (ref 6–20)
Calcium, Ion: 1.04 mmol/L — ABNORMAL LOW (ref 1.12–1.23)
Chloride: 105 mmol/L (ref 101–111)
Creatinine, Ser: 0.4 mg/dL — ABNORMAL LOW (ref 0.44–1.00)
Glucose, Bld: 158 mg/dL — ABNORMAL HIGH (ref 65–99)
HCT: 36 % (ref 36.0–46.0)
Hemoglobin: 12.2 g/dL (ref 12.0–15.0)
Potassium: 3.7 mmol/L (ref 3.5–5.1)
Sodium: 139 mmol/L (ref 135–145)
TCO2: 19 mmol/L (ref 0–100)

## 2015-09-15 MED ORDER — METOCLOPRAMIDE HCL 5 MG/ML IJ SOLN
10.0000 mg | Freq: Once | INTRAMUSCULAR | Status: AC
  Administered 2015-09-15: 10 mg via INTRAVENOUS
  Filled 2015-09-15: qty 2

## 2015-09-15 NOTE — ED Provider Notes (Signed)
CSN: [id number]     Arrival date & time 09/15/15  1050 History   First MD Initiated Contact with Patient 09/15/15 1114     Chief Complaint  Patient presents with  . Migraine     (Consider location/radiation/quality/duration/timing/severity/associated sxs/prior Treatment) HPI Complains of typical migraine headache onset yesterday afternoon gradually accompanied by 5 or 6 episodes of vomiting. She's had similar migraines since age 25. Headache is throbbing in nature, frontal. Associated symptoms include photophobia, nausea and vomiting. She was seen here yesterday for diarrhea, felt improved upon discharge. No other associated symptoms. She treated herself with Excedrin last night, without relief, though she did sleep last night. Headache is made worse with exposure to light. Nothing makes headache better. No other associated symptoms Past Medical History  Diagnosis Date  . Hypertension   . Gastroparesis   . Anxiety   . Infection     UTI April 2016  . Fibromyalgia   . Diabetes mellitus   . Diabetes type 1, uncontrolled (HCC) 11/14/2011    Since age 43   Past Surgical History  Procedure Laterality Date  . Ankle surgery    . Cholecystectomy  11/15/2011    Procedure: LAPAROSCOPIC CHOLECYSTECTOMY WITH INTRAOPERATIVE CHOLANGIOGRAM;  Surgeon: [name], MD;  Location: [facility];  Service: General;  Laterality: N/A;  . Laparoscopy  11/23/2011    Procedure: LAPAROSCOPY DIAGNOSTIC;  Surgeon: Mariella Saa, MD;  Location: [facility];  Service: General;  Laterality: N/A;  . Esophagogastroduodenoscopy  12/03/2011    Procedure: ESOPHAGOGASTRODUODENOSCOPY (EGD);  Surgeon: Theda Belfast, MD;  Location: [facility];  Service: Endoscopy;  Laterality: N/A;   Family History  Problem Relation Age of Onset  . Diabetes Mother   . Hypertension Father    Social History  Substance Use Topics  . Smoking status: Never Smoker   . Smokeless tobacco: Never Used  . Alcohol Use: 0.0 oz/week    0 Standard  drinks or equivalent per week   OB History    Gravida Para Term Preterm AB TAB SAB Ectopic Multiple Living   0 1     Review of Systems  Constitutional: Negative.   Eyes: Positive for photophobia.  Respiratory: Negative.   Cardiovascular: Negative.   Gastrointestinal: Positive for nausea and vomiting.  Musculoskeletal: Negative.   Skin: Negative.   Allergic/Immunologic: Positive for immunocompromised state.       Diabetic  Neurological: Positive for headaches.  Psychiatric/Behavioral: Negative.   All other systems reviewed and are negative.     Allergies  Peanut-containing drug products and Lactose intolerance (gi)  Home Medications   Prior to Admission medications   Medication Sig Start Date End Date Taking? Authorizing Provider  albuterol (PROVENTIL HFA;VENTOLIN HFA) 108 (90 Base) MCG/ACT inhaler Inhale 2 puffs into the lungs every 6 (six) hours as needed for wheezing or shortness of breath.    Historical Provider, MD  amoxicillin (AMOXIL) 500 MG capsule Take 500 mg by mouth every 8 (eight) hours. Started on 6/10. 7-day therapy.    Historical Provider, MD  bismuth subsalicylate (PEPTO BISMOL) 262 MG chewable tablet Chew 524 mg by mouth daily as needed for diarrhea or loose stools.    Historical Provider, MD  butalbital-acetaminophen-caffeine (FIORICET) 50-325-40 MG per tablet Take 1-2 tablets by mouth every 6 (six) hours as needed for headache. Patient not taking: Reported on 04/30/2015 11/09/14 11/09/15  Armando Reichert, CNM  calcium carbonate (TUMS - DOSED IN MG ELEMENTAL CALCIUM) 500  MG chewable tablet Chew 2 tablets by mouth daily as needed for indigestion or heartburn.    Historical Provider, MD  cephALEXin (KEFLEX) 500 MG capsule Take 1 capsule (500 mg total) by mouth 2 (two) times daily. Patient not taking: Reported on 09/14/2015 05/09/15   Shon Baton, MD  Cholecalciferol 400 units CHEW Chew 800 Units by mouth daily.    Historical Provider, MD   ciprofloxacin (CIPRO) 500 MG tablet Take 1 tablet (500 mg total) by mouth 2 (two) times daily. Patient not taking: Reported on 09/14/2015 05/24/15   Roxy Horseman, PA-C  EPINEPHrine (EPIPEN 2-PAK) 0.3 mg/0.3 mL IJ SOAJ injection Inject 0.3 mLs (0.3 mg total) into the muscle once. 08/10/14   Rachelle A Denney, CNM  glucose 4 GM chewable tablet Chew 1 tablet (4 g total) by mouth as needed for low blood sugar. 08/10/14   Rachelle A Denney, CNM  guaiFENesin-codeine 100-10 MG/5ML syrup Take 10 mLs by mouth every 8 (eight) hours as needed for cough. 04/29/15   Tilden Fossa, MD  hydrochlorothiazide (HYDRODIURIL) 25 MG tablet Take 1 tablet (25 mg total) by mouth daily. Patient taking differently: Take 25 mg by mouth daily as needed.  01/29/15   Brock Bad, MD  ibuprofen (ADVIL,MOTRIN) 600 MG tablet Take 1 tablet (600 mg total) by mouth every 6 (six) hours as needed for mild pain. Patient not taking: Reported on 04/30/2015 01/29/15   Brock Bad, MD  Ibuprofen (MIDOL) 200 MG CAPS Take 400 mg by mouth daily as needed (pain).    Historical Provider, MD  insulin glargine (LANTUS) 100 UNIT/ML injection Inject 16 Units into the skin 2 (two) times daily. Sometimes has to adjust based on BG levels    Historical Provider, MD  insulin lispro (HUMALOG) 100 UNIT/ML injection Inject 8 Units into the skin 3 (three) times daily as needed for high blood sugar. Patient uses sliding scale.  Always uses 8 units but could be more.    Historical Provider, MD  ketotifen (ZADITOR) 0.025 % ophthalmic solution Place 1 drop into both eyes 2 (two) times daily as needed (dry eyes).    Historical Provider, MD  labetalol (NORMODYNE) 300 MG tablet Take 1 tablet (300 mg total) by mouth 3 (three) times daily. 01/29/15   Brock Bad, MD  Loperamide HCl (IMODIUM A-D) 1 MG/7.5ML LIQD Take 1 mg by mouth daily as needed (diarrhea).    Historical Provider, MD  metoCLOPramide (REGLAN) 10 MG tablet Take 1 tablet (10 mg total) by  mouth every 6 (six) hours. 09/14/15   Cheri Fowler, PA-C  Norethindrone Acetate-Ethinyl Estrad-FE (LOESTRIN 24 FE) 1-20 MG-MCG(24) tablet Take 1 tablet by mouth daily. Patient not taking: Reported on 09/14/2015 03/29/15   Brock Bad, MD  ondansetron (ZOFRAN) 4 MG tablet Take 1 tablet (4 mg total) by mouth every 8 (eight) hours as needed for nausea or vomiting. 04/30/15   Fayrene Helper, PA-C  oxyCODONE-acetaminophen (PERCOCET/ROXICET) 5-325 MG tablet Take 2 tablets by mouth every 4 (four) hours as needed for severe pain. 09/14/15   Cheri Fowler, PA-C  phenazopyridine (PYRIDIUM) 200 MG tablet Take 1 tablet (200 mg total) by mouth 3 (three) times daily as needed for pain. Patient not taking: Reported on 09/14/2015 05/09/15   Shon Baton, MD  potassium chloride SA (K-DUR,KLOR-CON) 20 MEQ tablet Take 1 tablet (20 mEq total) by mouth daily. Patient not taking: Reported on 09/14/2015 04/30/15   Fayrene Helper, PA-C  Prenat w/o A-FeCbn-Meth-FA-DHA (OB COMPLETE GOLD) 27.5-1-200 MG  CAPS Take 1 tablet by mouth daily. Patient not taking: Reported on 09/14/2015 09/15/14   Rachelle A Denney, CNM  ranitidine (ZANTAC) 150 MG capsule Take 1 capsule (150 mg total) by mouth 2 (two) times daily. Patient not taking: Reported on 09/14/2015 01/02/15   Brock Bad, MD   BP 128/95 mmHg  Pulse 102  Temp(Src) 97.8 F (36.6 C) (Oral)  Resp 16  SpO2 100%  LMP 09/05/2015 Physical Exam  Constitutional: She is oriented to person, place, and time. She appears well-developed and well-nourished.  HENT:  Head: Normocephalic and atraumatic.  Eyes: Conjunctivae are normal. Pupils are equal, round, and reactive to light.  Neck: Neck supple. No tracheal deviation present. No thyromegaly present.  Cardiovascular: Normal rate and regular rhythm.   No murmur heard. Pulmonary/Chest: Effort normal and breath sounds normal.  Abdominal: Soft. Bowel sounds are normal. She exhibits no distension. There is no tenderness.  Musculoskeletal:  Normal range of motion. She exhibits no edema or tenderness.  Neurological: She is alert and oriented to person, place, and time. No cranial nerve deficit. Coordination normal.  DTRs symmetric bilaterally at knee jerk ankle jerk and biceps toes downward going bilaterally gait normal Romberg normal pronator drift normal  Skin: Skin is warm and dry. No rash noted.  Psychiatric: She has a normal mood and affect.  Nursing note and vitals reviewed.   ED Course  Procedures (including critical care time) Labs Review Labs Reviewed - No data to display  Imaging Review No results found. I have personally reviewed and evaluated these images and lab results as part of my medical decision-making.   EKG Interpretation None     12:55 PM feels much improved after treatment with intravenous Reglan patient alert feels ready to go home. Results for orders placed or performed during the hospital encounter of 09/15/15  I-stat chem 8, ed  Result Value Ref Range   Sodium 139 135 - 145 mmol/L   Potassium 3.7 3.5 - 5.1 mmol/L   Chloride 105 101 - 111 mmol/L   BUN 8 6 - 20 mg/dL   Creatinine, Ser 0.96 (L) 0.44 - 1.00 mg/dL   Glucose, Bld 438 (H) 65 - 99 mg/dL   Calcium, Ion 3.81 (L) 1.12 - 1.23 mmol/L   TCO2 19 0 - 100 mmol/L   Hemoglobin 12.2 12.0 - 15.0 g/dL   HCT 84.0 37.5 - 43.6 %   No results found.  MDM  Referral [facility] to get primary care physician Final diagnoses:  None   Dx #1 migraine headache #2 hyperglycemia     Doug Sou, MD 09/15/15 1300

## 2015-09-15 NOTE — Discharge Instructions (Signed)
Migraine Headache Call the [facility] to get a primary care physician. Return if concerned for any reason. A migraine headache is very bad, throbbing pain on one or both sides of your head. Talk to your doctor about what things may bring on (trigger) your migraine headaches. HOME CARE  Only take medicines as told by your doctor.  Lie down in a dark, quiet room when you have a migraine.  Keep a journal to find out if certain things bring on migraine headaches. For example, write down:  What you eat and drink.  How much sleep you get.  Any change to your diet or medicines.  Lessen how much alcohol you drink.  Quit smoking if you smoke.  Get enough sleep.  Lessen any stress in your life.  Keep lights dim if bright lights bother you or make your migraines worse. GET HELP RIGHT AWAY IF:   Your migraine becomes really bad.  You have a fever.  You have a stiff neck.  You have trouble seeing.  Your muscles are weak, or you lose muscle control.  You lose your balance or have trouble walking.  You feel like you will pass out (faint), or you pass out.  You have really bad symptoms that are different than your first symptoms. MAKE SURE YOU:   Understand these instructions.  Will watch your condition.  Will get help right away if you are not doing well or get worse.   This information is not intended to replace advice given to you by your health care provider. Make sure you discuss any questions you have with your health care provider.   Document Released: 12/25/2007 Document Revised: 06/09/2011 Document Reviewed: 11/22/2012 Elsevier Interactive Patient Education [employer]

## 2015-09-15 NOTE — ED Notes (Signed)
Unsuccessful IV attempt. [name] attempting IV at present time.

## 2015-09-15 NOTE — ED Notes (Signed)
She c/o frontal h/a which she recognizes as a migraine.  She is in no distress.

## 2015-09-15 NOTE — ED Notes (Signed)
Pt reports migraine onset yesterday; light sensitivity.

## 2015-09-27 ENCOUNTER — Ambulatory Visit: Payer: 59 | Admitting: Obstetrics

## 2015-11-11 ENCOUNTER — Emergency Department (HOSPITAL_COMMUNITY)
Admission: EM | Admit: 2015-11-11 | Discharge: 2015-11-12 | Disposition: A | Discharge status: Home / Self Care | Payer: 59 | Attending: Dermatology | Admitting: Dermatology

## 2015-11-11 ENCOUNTER — Encounter (HOSPITAL_COMMUNITY): Payer: Self-pay

## 2015-11-11 DIAGNOSIS — Z7951 Long term (current) use of inhaled steroids: Secondary | ICD-10-CM | POA: Diagnosis not present

## 2015-11-11 DIAGNOSIS — Z794 Long term (current) use of insulin: Secondary | ICD-10-CM | POA: Diagnosis not present

## 2015-11-11 DIAGNOSIS — Z5321 Procedure and treatment not carried out due to patient leaving prior to being seen by health care provider: Secondary | ICD-10-CM | POA: Insufficient documentation

## 2015-11-11 DIAGNOSIS — I10 Essential (primary) hypertension: Secondary | ICD-10-CM | POA: Diagnosis not present

## 2015-11-11 DIAGNOSIS — E1065 Type 1 diabetes mellitus with hyperglycemia: Secondary | ICD-10-CM | POA: Insufficient documentation

## 2015-11-11 DIAGNOSIS — Z79899 Other long term (current) drug therapy: Secondary | ICD-10-CM | POA: Diagnosis not present

## 2015-11-11 DIAGNOSIS — R109 Unspecified abdominal pain: Secondary | ICD-10-CM | POA: Diagnosis present

## 2015-11-11 LAB — CBG MONITORING, ED: Glucose-Capillary: 298 mg/dL — ABNORMAL HIGH (ref 65–99)

## 2015-11-11 NOTE — ED Triage Notes (Addendum)
Pt states that woke up this morning with a headache and burning abdominal pain. Endorses N/V/D. Denies urinary symptoms. A&Ox4. Pt states that her last CBG was this morning and it was 70.

## 2015-11-12 NOTE — ED Notes (Signed)
Pt called x 2 

## 2015-11-12 NOTE — ED Notes (Signed)
Pt called x 3 .  °

## 2015-11-12 NOTE — ED Notes (Signed)
Pt called x1

## 2016-01-15 ENCOUNTER — Encounter (HOSPITAL_COMMUNITY): Payer: Self-pay | Admitting: *Deleted

## 2016-01-15 ENCOUNTER — Emergency Department (HOSPITAL_COMMUNITY): Payer: 59

## 2016-01-15 ENCOUNTER — Emergency Department (HOSPITAL_COMMUNITY)
Admission: EM | Admit: 2016-01-15 | Discharge: 2016-01-15 | Disposition: A | Discharge status: Home / Self Care | Payer: 59 | Attending: Emergency Medicine | Admitting: Emergency Medicine

## 2016-01-15 DIAGNOSIS — Z91010 Allergy to peanuts: Secondary | ICD-10-CM | POA: Insufficient documentation

## 2016-01-15 DIAGNOSIS — I10 Essential (primary) hypertension: Secondary | ICD-10-CM | POA: Insufficient documentation

## 2016-01-15 DIAGNOSIS — R112 Nausea with vomiting, unspecified: Secondary | ICD-10-CM

## 2016-01-15 DIAGNOSIS — R109 Unspecified abdominal pain: Secondary | ICD-10-CM

## 2016-01-15 DIAGNOSIS — E1043 Type 1 diabetes mellitus with diabetic autonomic (poly)neuropathy: Secondary | ICD-10-CM | POA: Insufficient documentation

## 2016-01-15 DIAGNOSIS — Z79899 Other long term (current) drug therapy: Secondary | ICD-10-CM | POA: Insufficient documentation

## 2016-01-15 LAB — COMPREHENSIVE METABOLIC PANEL
ALT: 15 U/L (ref 14–54)
AST: 21 U/L (ref 15–41)
Albumin: 3.8 g/dL (ref 3.5–5.0)
Alkaline Phosphatase: 114 U/L (ref 38–126)
Anion gap: 9 (ref 5–15)
BUN: 11 mg/dL (ref 6–20)
CO2: 32 mmol/L (ref 22–32)
Calcium: 10 mg/dL (ref 8.9–10.3)
Chloride: 92 mmol/L — ABNORMAL LOW (ref 101–111)
Creatinine, Ser: 0.65 mg/dL (ref 0.44–1.00)
GFR calc Af Amer: >60 mL/min (ref >60)
GFR calc non Af Amer: >60 mL/min (ref >60)
Glucose, Bld: 341 mg/dL — ABNORMAL HIGH (ref 65–99)
Potassium: 3.4 mmol/L — ABNORMAL LOW (ref 3.5–5.1)
Sodium: 133 mmol/L — ABNORMAL LOW (ref 135–145)
Total Bilirubin: 0.8 mg/dL (ref 0.3–1.2)
Total Protein: 7.6 g/dL (ref 6.5–8.1)

## 2016-01-15 LAB — URINALYSIS, ROUTINE W REFLEX MICROSCOPIC
Bilirubin Urine: NEGATIVE
Glucose, UA: >1000 mg/dL — AB
Hgb urine dipstick: NEGATIVE
Ketones, ur: NEGATIVE mg/dL
Leukocytes, UA: NEGATIVE
Nitrite: NEGATIVE
Protein, ur: NEGATIVE mg/dL
Specific Gravity, Urine: 1.037 — ABNORMAL HIGH (ref 1.005–1.030)
pH: 6 (ref 5.0–8.0)

## 2016-01-15 LAB — LIPASE, BLOOD: Lipase: 21 U/L (ref 11–51)

## 2016-01-15 LAB — URINE MICROSCOPIC-ADD ON

## 2016-01-15 LAB — CBC
HCT: 36.9 % (ref 36.0–46.0)
Hemoglobin: 12.9 g/dL (ref 12.0–15.0)
MCH: 30.4 pg (ref 26.0–34.0)
MCHC: 35 g/dL (ref 30.0–36.0)
MCV: 86.8 fL (ref 78.0–100.0)
Platelets: 207 10*3/uL (ref 150–400)
RBC: 4.25 MIL/uL (ref 3.87–5.11)
RDW: 12.3 % (ref 11.5–15.5)
WBC: 5.1 10*3/uL (ref 4.0–10.5)

## 2016-01-15 LAB — PREGNANCY, URINE: Preg Test, Ur: NEGATIVE

## 2016-01-15 IMAGING — CT CT ABD-PELV W/ CM
2 of 4 series · 16 of 46 positions shown, 18 images · IV contrast (ISOVUE)
Comparison: CT abdomen pelvis of [DATE]

CLINICAL DATA: Diarrhea for 2 days, abdominal pain in the
epigastric region

EXAM:
CT ABDOMEN AND PELVIS WITH CONTRAST
TECHNIQUE: Multidetector CT imaging of the abdomen and pelvis was performed
using the standard protocol following bolus administration of
intravenous contrast.
CONTRAST:  100mL [HL] IOPAMIDOL ([HL]) INJECTION 61%

[Series 2: abd/pel with · axial · 0.76mm/px · z∈[+1092,+1516]mm · 13 of 95 slices shown, 15 images]
[im 5/95  soft-tissue]
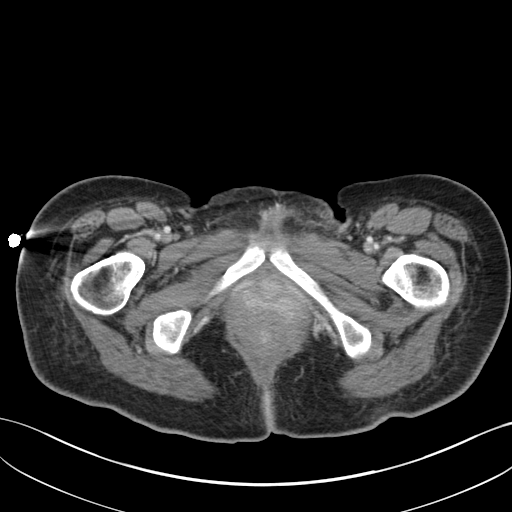
[im 5/95  bone]
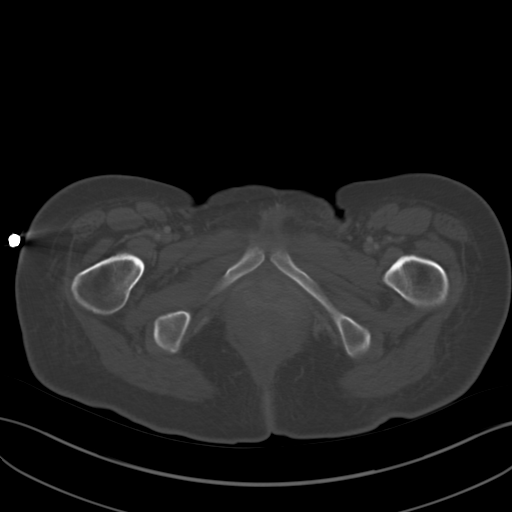
[im 15/95  soft-tissue]
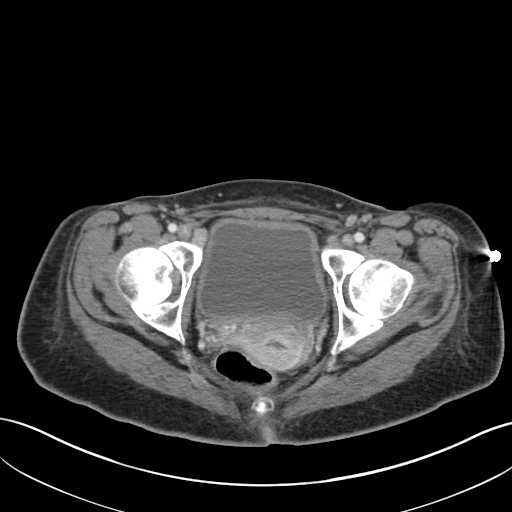
[im 19/95  soft-tissue]
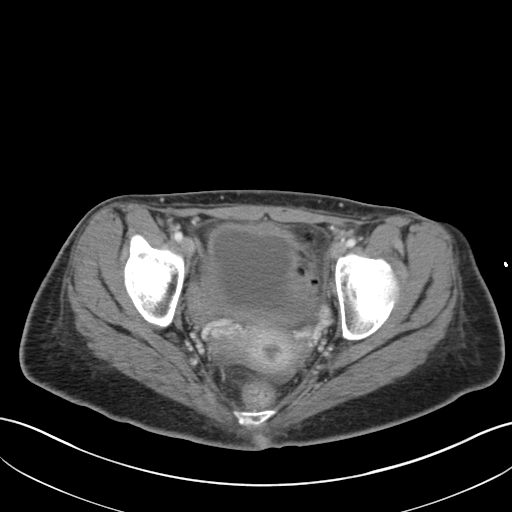
[im 29/95  soft-tissue]
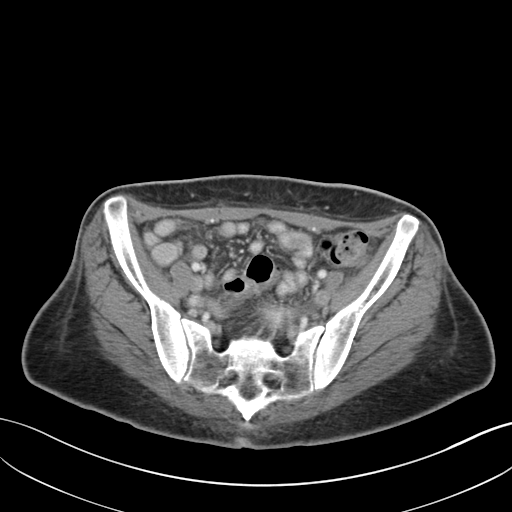
[im 33/95  soft-tissue]
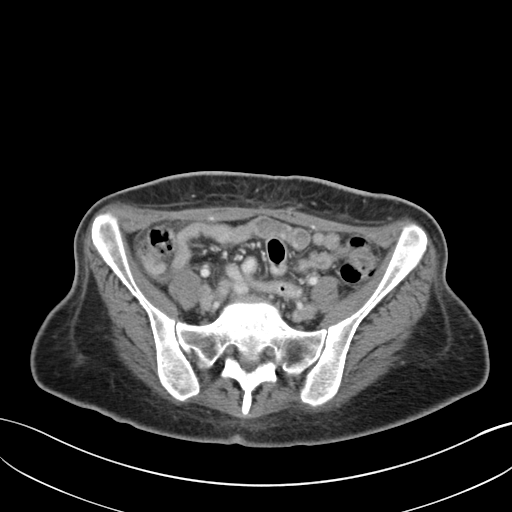
[im 43/95  soft-tissue]
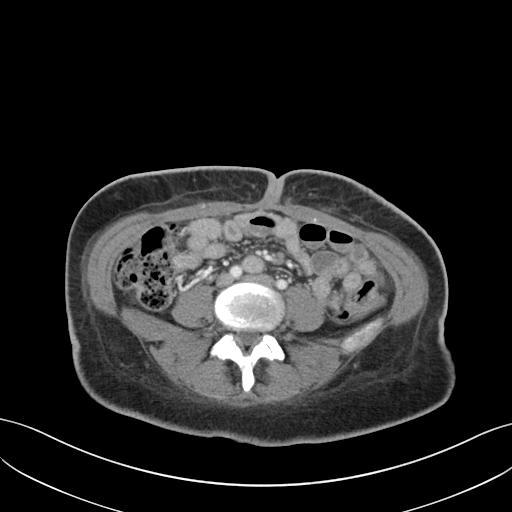
[im 48/95  soft-tissue]
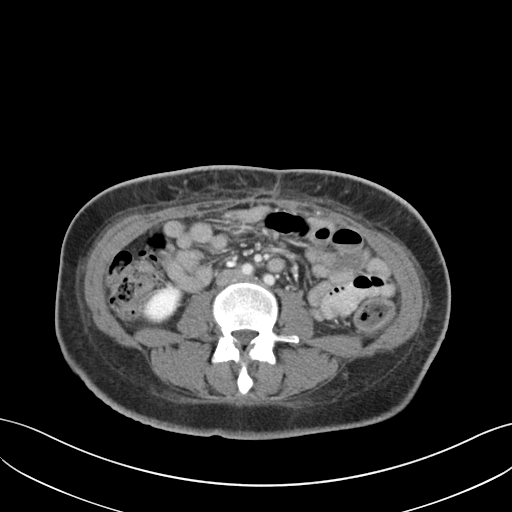
[im 52/95  soft-tissue]
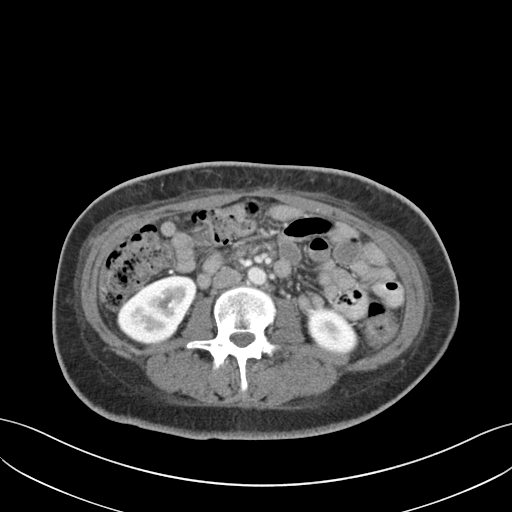
[im 62/95  soft-tissue]
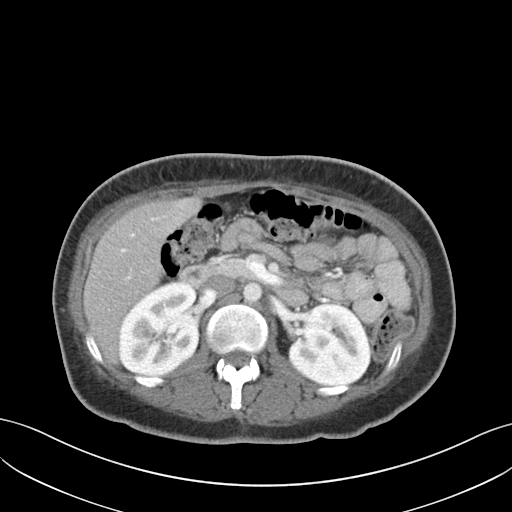
[im 62/95  bone]
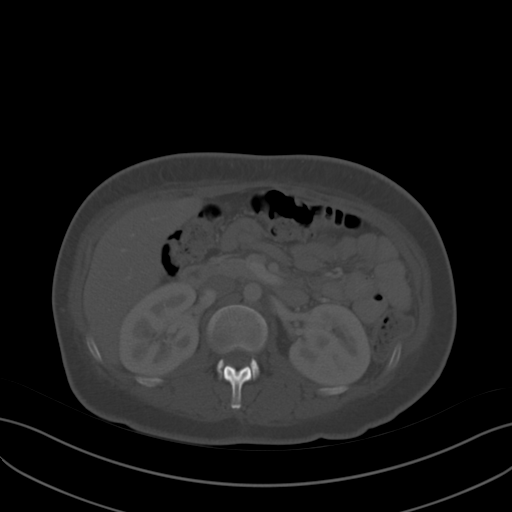
[im 66/95  soft-tissue]
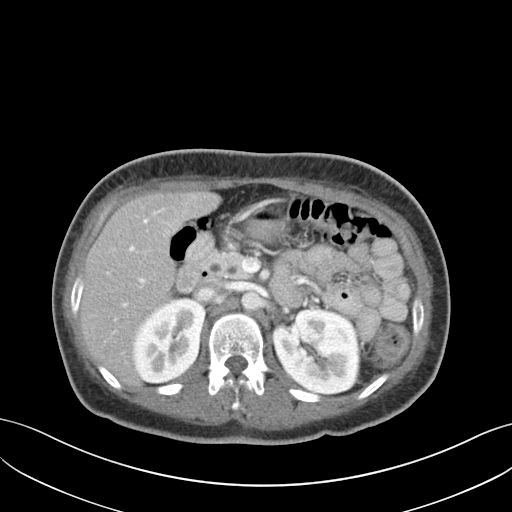
[im 76/95  soft-tissue]
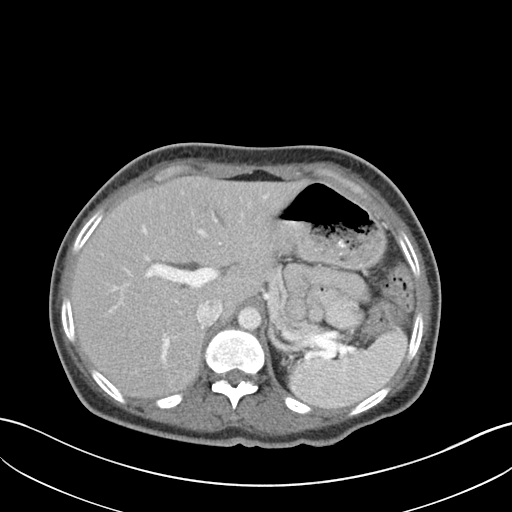
[im 80/95  soft-tissue]
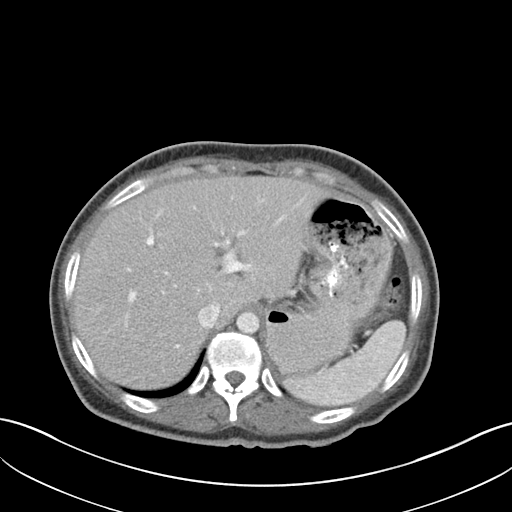
[im 90/95  soft-tissue]
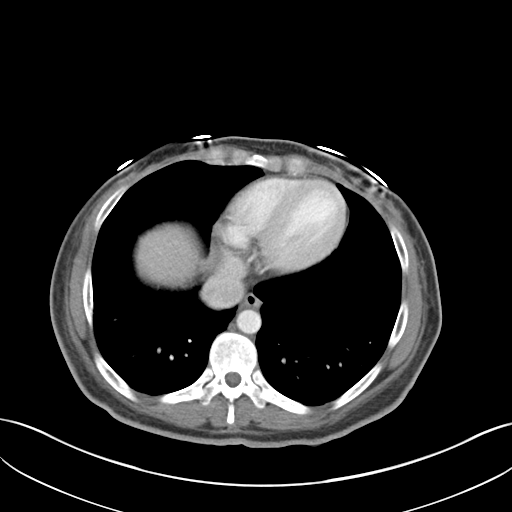

[Series 3: coronal a/|p · coronal · 0.77mm/px · 3 of 118 slices shown]
[im 40/118  soft-tissue]
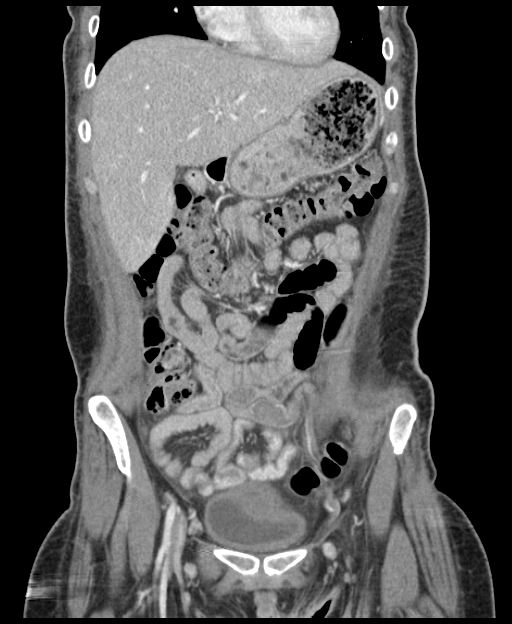
[im 53/118  soft-tissue]
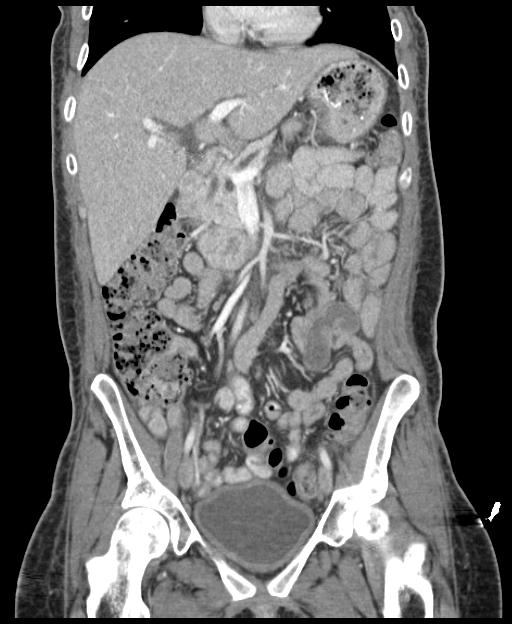
[im 66/118  soft-tissue]
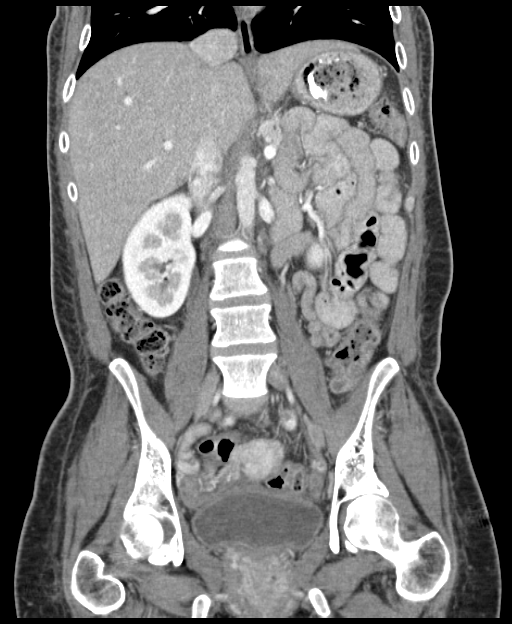

[16 of 46 positions shown; findings below may reference images not displayed]

FINDINGS: Lower chest: The lung bases are clear.

Hepatobiliary: The liver enhances with no focal abnormality and no
ductal dilatation is seen. Surgical clips are present from prior
cholecystectomy.

Pancreas: The pancreas is not enlarged and the pancreatic duct is
prominent but stable post cholecystectomy.

Spleen: The spleen is within normal limits in size.

Adrenals/Urinary Tract: The kidneys enhance with no calculus or mass
and there is no evidence of hydronephrosis. The distal ureters do
not appear to be dilated. The urinary bladder is moderately well
distended and is slightly thick-walled. Edema cannot be excluded as
with cystitis.

Stomach/Bowel: The stomach is distended with food debris and fluid
with no abnormality noted. No small bowel distention is seen. The
terminal ileum is unremarkable. The appendix may be faintly
visualized on the sagittal images with no abnormality noted.

Vascular/Lymphatic: The abdominal aorta is normal in caliber. No
adenopathy is seen.

Reproductive: The uterus is normal in size. A small amount of fluid
is noted within the endocervical canal, probably related to the
patient's menstrual cycle. A probable collapsing right ovarian cyst
is present. Only a tiny amount of free fluid is noted in the pelvis.

Other: None

Musculoskeletal: The lumbar vertebrae are in normal alignment with
normal intervertebral disc spaces.
IMPRESSION: 1. No explanation for the patient's diarrhea is seen. No edema of
large or small bowel is noted.
2. Slightly thick-walled urinary bladder of questionable
significance. Edema cannot be excluded as with cystitis.
3. Probable collapsing right ovarian cyst. Small amount of fluid
within the endocervical canal.

## 2016-01-15 MED ORDER — PROMETHAZINE HCL 25 MG PO TABS: 25.0000 mg | ORAL_TABLET | Freq: Three times a day (TID) | ORAL | 0 refills | Status: DC | PRN

## 2016-01-15 MED ORDER — SODIUM CHLORIDE 0.9 % IV BOLUS (SEPSIS)
1000.0000 mL | Freq: Once | INTRAVENOUS | Status: AC
  Administered 2016-01-15: 1000 mL via INTRAVENOUS

## 2016-01-15 MED ORDER — MORPHINE SULFATE (PF) 4 MG/ML IV SOLN
4.0000 mg | Freq: Once | INTRAVENOUS | Status: AC
  Administered 2016-01-15: 4 mg via INTRAVENOUS
  Filled 2016-01-15: qty 1

## 2016-01-15 MED ORDER — IOPAMIDOL (ISOVUE-300) INJECTION 61%
15.0000 mL | Freq: Once | INTRAVENOUS | Status: AC | PRN
  Administered 2016-01-15: 15 mL via ORAL

## 2016-01-15 MED ORDER — IOPAMIDOL (ISOVUE-300) INJECTION 61%
100.0000 mL | Freq: Once | INTRAVENOUS | Status: AC | PRN
  Administered 2016-01-15: 100 mL via INTRAVENOUS

## 2016-01-15 MED ORDER — TRAMADOL HCL 50 MG PO TABS: 50.0000 mg | ORAL_TABLET | Freq: Four times a day (QID) | ORAL | 0 refills | Status: DC | PRN

## 2016-01-15 MED ORDER — PROMETHAZINE HCL 25 MG/ML IJ SOLN
25.0000 mg | Freq: Once | INTRAMUSCULAR | Status: AC
  Administered 2016-01-15: 25 mg via INTRAVENOUS
  Filled 2016-01-15: qty 1

## 2016-01-15 NOTE — ED Triage Notes (Signed)
Pt states that she has had diarrhea x 2 days; pt states that she began having abd pain to her epigastric area yesterday; pt describes the pain as burning; pt denies N/V

## 2016-01-15 NOTE — Progress Notes (Addendum)
Inpatient Diabetes Program Recommendations  AACE/ADA: New Consensus Statement on Inpatient Glycemic Control (2015)  Target Ranges:  Prepandial:   less than 140 mg/dL      Peak postprandial:   less than 180 mg/dL (1-2 hours)      Critically ill patients:  140 - 180 mg/dL   Lab Results  Component Value Date   GLUCAP 298 (H) 11/11/2015   HGBA1C 7.3 (H) 11/07/2014    Review of Glycemic Control  Diabetes history: DM 1 Outpatient Diabetes medications: Lantus 16 units BID, Humalog 8 units TID Current orders for Inpatient glycemic control: None pending MD eval  Inpatient Diabetes Program Recommendations:   Glucose 341 this am. Patient has DM type 1, please assess last time patient has had basal insulin and order 80%-100% of patient's home dose of basal insulin while here to prevent DKA.  Thanks,  Christena Deem RN, MSN, [facility] Inpatient Diabetes Coordinator Team Pager [phone] (8a-5p)

## 2016-01-15 NOTE — ED Provider Notes (Signed)
[facility]-EMERGENCY DEPT Provider Note   CSN: [id number] Arrival date & time: 01/15/16  0442     History   Chief Complaint Chief Complaint  Patient presents with  . Abdominal Pain    HPI [name] is a 25 y.o. female.  HPI Patient presents to the emergency department with abdominal pain that started about a week ago.  It got worse since yesterday.  The patient states that she has had vomiting and diarrhea over the last 2 days.  The patient states that the pain is a burning sensation.  She states that she has an appointment today with her gastroenterologist.  Patient states she did not take any medications prior to arrival.  Since nothing seems make the condition better.  Palpation and movement make the pain worse. The patient denies chest pain, shortness of breath, headache,blurred vision, neck pain, fever, cough, weakness, numbness, dizziness, anorexia, edema rash, back pain, dysuria, hematemesis, bloody stool, near syncope, or syncope. Past Medical History:  Diagnosis Date  . Anxiety   . Diabetes mellitus   . Diabetes type 1, uncontrolled (HCC) 11/14/2011   Since age 28  . Fibromyalgia   . Gastroparesis   . Hypertension   . Infection    UTI April 2016    Patient Active Problem List   Diagnosis Date Noted  . Forceps or vacuum extractor delivery 01/26/2015  . Pregnant 01/24/2015  . Hypertension in pregnancy, antepartum 01/16/2015  . Pre-existing essential hypertension complicating pregnancy in second trimester   . Abnormal first trimester screen   . Encounter for fetal anatomic survey   . [redacted] weeks gestation of pregnancy   . Encounter for (NT) nuchal translucency scan   . First trimester screening   . [redacted] weeks gestation of pregnancy   . Pre-existing type 1 diabetes mellitus during pregnancy in first trimester   . Hypertension in pregnancy   . DKA (diabetic ketoacidoses) (HCC) 06/11/2013  . DKA, type 1 (HCC) 06/11/2013  . Orthostatic hypotension dysautonomic syndrome  (HCC) 04/28/2013  . Body aches 04/28/2013  . Orthostatic hypotension 04/28/2013  . Diabetes mellitus type 1 (HCC) 04/28/2013  . Protein-calorie malnutrition, severe (HCC) 04/28/2013  . Noncompliance with diabetes treatment 09/01/2012  . Epigastric abdominal pain 03/15/2012  . Dyspnea 02/28/2012  . Sinus tachycardia 02/28/2012  . Hyperglycemia without ketosis 02/28/2012  . Chest pain 12/04/2011  . Diabetes type 1, uncontrolled (HCC) 11/14/2011  . Hypertension associated with diabetes (HCC) 11/14/2011  . Gastroparesis 11/14/2011  . Hyponatremia 09/19/2011  . Abdominal pain 09/18/2011  . Metabolic alkalosis 09/18/2011  . Diabetic hyperosmolar non-ketotic state (HCC) 09/18/2011  . DIAB W/UNSPEC COMP TYPE I [JUV TYPE] UNCNTRL 06/04/2010  . CONSTIPATION 01/01/2010  . RECTAL PAIN 01/01/2010    Past Surgical History:  Procedure Laterality Date  . ANKLE SURGERY    . CHOLECYSTECTOMY  11/15/2011   Procedure: LAPAROSCOPIC CHOLECYSTECTOMY WITH INTRAOPERATIVE CHOLANGIOGRAM;  Surgeon: [name], MD;  Location: [facility];  Service: General;  Laterality: N/A;  . ESOPHAGOGASTRODUODENOSCOPY  12/03/2011   Procedure: ESOPHAGOGASTRODUODENOSCOPY (EGD);  Surgeon: Theda Belfast, MD;  Location: [facility];  Service: Endoscopy;  Laterality: N/A;  . LAPAROSCOPY  11/23/2011   Procedure: LAPAROSCOPY DIAGNOSTIC;  Surgeon: Mariella Saa, MD;  Location: [facility];  Service: General;  Laterality: N/A;    OB History    Gravida Para Term Preterm AB Living   1 1   1   1    SAB TAB Ectopic Multiple Live Births  0 1       Home Medications    Prior to Admission medications   Medication Sig Start Date End Date Taking? Authorizing Provider  albuterol (PROVENTIL HFA;VENTOLIN HFA) 108 (90 Base) MCG/ACT inhaler Inhale 2 puffs into the lungs every 6 (six) hours as needed for wheezing or shortness of breath.   Yes Historical Provider, MD  bismuth subsalicylate (PEPTO BISMOL) 262 MG chewable tablet Chew  524 mg by mouth daily as needed for diarrhea or loose stools.   Yes Historical Provider, MD  calcium carbonate (TUMS - DOSED IN MG ELEMENTAL CALCIUM) 500 MG chewable tablet Chew 2 tablets by mouth daily as needed for indigestion or heartburn.   Yes Historical Provider, MD  Cholecalciferol 400 units CHEW Chew 800 Units by mouth daily.   Yes Historical Provider, MD  EPINEPHrine (EPIPEN 2-PAK) 0.3 mg/0.3 mL IJ SOAJ injection Inject 0.3 mLs (0.3 mg total) into the muscle once. 08/10/14  Yes Rachelle A Denney, CNM  glucose 4 GM chewable tablet Chew 1 tablet (4 g total) by mouth as needed for low blood sugar. 08/10/14  Yes Rachelle A Denney, CNM  guaiFENesin-codeine 100-10 MG/5ML syrup Take 10 mLs by mouth every 8 (eight) hours as needed for cough. 04/29/15  Yes Tilden Fossa, MD  Ibuprofen (MIDOL) 200 MG CAPS Take 400 mg by mouth daily as needed (pain).   Yes Historical Provider, MD  insulin glargine (LANTUS) 100 UNIT/ML injection Inject 16 Units into the skin 2 (two) times daily. Sometimes has to adjust based on BG levels   Yes Historical Provider, MD  insulin lispro (HUMALOG) 100 UNIT/ML injection Inject 8 Units into the skin 3 (three) times daily as needed for high blood sugar. Patient uses sliding scale.  Always uses 8 units but could be more.   Yes Historical Provider, MD  ketotifen (ZADITOR) 0.025 % ophthalmic solution Place 1 drop into both eyes 2 (two) times daily as needed (dry eyes).   Yes Historical Provider, MD  Loperamide HCl (IMODIUM A-D) 1 MG/7.5ML LIQD Take 1 mg by mouth daily as needed (diarrhea).   Yes Historical Provider, MD  metoCLOPramide (REGLAN) 10 MG tablet Take 1 tablet (10 mg total) by mouth every 6 (six) hours. 09/14/15  Yes Kayla Rose, PA-C  ondansetron (ZOFRAN) 4 MG tablet Take 1 tablet (4 mg total) by mouth every 8 (eight) hours as needed for nausea or vomiting. 04/30/15  Yes Fayrene Helper, PA-C  Propylene Glycol 0.6 % SOLN Apply 1 drop to eye 2 (two) times daily.   Yes Historical  Provider, MD  cephALEXin (KEFLEX) 500 MG capsule Take 1 capsule (500 mg total) by mouth 2 (two) times daily. Patient not taking: Reported on 09/14/2015 05/09/15   Shon Baton, MD  ciprofloxacin (CIPRO) 500 MG tablet Take 1 tablet (500 mg total) by mouth 2 (two) times daily. Patient not taking: Reported on 09/14/2015 05/24/15   Roxy Horseman, PA-C  hydrochlorothiazide (HYDRODIURIL) 25 MG tablet Take 1 tablet (25 mg total) by mouth daily. Patient not taking: Reported on 01/15/2016 01/29/15   Brock Bad, MD  ibuprofen (ADVIL,MOTRIN) 600 MG tablet Take 1 tablet (600 mg total) by mouth every 6 (six) hours as needed for mild pain. Patient not taking: Reported on 04/30/2015 01/29/15   Brock Bad, MD  labetalol (NORMODYNE) 300 MG tablet Take 1 tablet (300 mg total) by mouth 3 (three) times daily. Patient not taking: Reported on 01/15/2016 01/29/15   Brock Bad, MD  Norethindrone Acetate-Ethinyl Estrad-FE (LOESTRIN 24  FE) 1-20 MG-MCG(24) tablet Take 1 tablet by mouth daily. Patient not taking: Reported on 09/14/2015 03/29/15   Brock Bad, MD  oxyCODONE-acetaminophen (PERCOCET/ROXICET) 5-325 MG tablet Take 2 tablets by mouth every 4 (four) hours as needed for severe pain. Patient not taking: Reported on 01/15/2016 09/14/15   Cheri Fowler, PA-C  phenazopyridine (PYRIDIUM) 200 MG tablet Take 1 tablet (200 mg total) by mouth 3 (three) times daily as needed for pain. Patient not taking: Reported on 09/14/2015 05/09/15   Shon Baton, MD  potassium chloride SA (K-DUR,KLOR-CON) 20 MEQ tablet Take 1 tablet (20 mEq total) by mouth daily. Patient not taking: Reported on 09/14/2015 04/30/15   Fayrene Helper, PA-C  Prenat w/o A-FeCbn-Meth-FA-DHA (OB COMPLETE GOLD) 27.5-1-200 MG CAPS Take 1 tablet by mouth daily. Patient not taking: Reported on 09/14/2015 09/15/14   Rachelle A Denney, CNM  ranitidine (ZANTAC) 150 MG capsule Take 1 capsule (150 mg total) by mouth 2 (two) times daily. Patient not  taking: Reported on 09/14/2015 01/02/15   Brock Bad, MD    Family History Family History  Problem Relation Age of Onset  . Diabetes Mother   . Hypertension Father     Social History Social History  Substance Use Topics  . Smoking status: Never Smoker  . Smokeless tobacco: Never Used  . Alcohol use 0.0 oz/week     Allergies   Peanut-containing drug products and Lactose intolerance (gi)   Review of Systems Review of Systems All other systems negative except as documented in the HPI. All pertinent positives and negatives as reviewed in the HPI.  Physical Exam Updated Vital Signs BP 119/92   Pulse 88   Temp 97.2 F (36.2 C) (Oral)   Resp 18   LMP 12/24/2015   SpO2 99%   Physical Exam  Constitutional: She is oriented to person, place, and time. She appears well-developed and well-nourished. No distress.  HENT:  Head: Normocephalic and atraumatic.  Mouth/Throat: Oropharynx is clear and moist.  Eyes: Pupils are equal, round, and reactive to light.  Neck: Normal range of motion. Neck supple.  Cardiovascular: Normal rate, regular rhythm and normal heart sounds.  Exam reveals no gallop and no friction rub.   No murmur heard. Pulmonary/Chest: Effort normal and breath sounds normal. No respiratory distress. She has no wheezes.  Abdominal: Soft. Bowel sounds are normal. She exhibits no distension and no mass. There is tenderness. There is no rebound and no guarding.    Neurological: She is alert and oriented to person, place, and time. She exhibits normal muscle tone. Coordination normal.  Skin: Skin is warm and dry. No rash noted. No erythema.  Psychiatric: She has a normal mood and affect. Her behavior is normal.  Nursing note and vitals reviewed.    ED Treatments / Results  Labs (all labs ordered are listed, but only abnormal results are displayed) Labs Reviewed  COMPREHENSIVE METABOLIC PANEL - Abnormal; Notable for the following:       Result Value   Sodium  133 (*)    Potassium 3.4 (*)    Chloride 92 (*)    Glucose, Bld 341 (*)    All other components within normal limits  URINALYSIS, ROUTINE W REFLEX MICROSCOPIC (NOT AT [facility]) - Abnormal; Notable for the following:    Specific Gravity, Urine 1.037 (*)    Glucose, UA >1000 (*)    All other components within normal limits  URINE MICROSCOPIC-ADD ON - Abnormal; Notable for the following:    Squamous  Epithelial / LPF 0-5 (*)    Bacteria, UA FEW (*)    All other components within normal limits  LIPASE, BLOOD  CBC  PREGNANCY, URINE    EKG  EKG Interpretation None       Radiology Ct Abdomen Pelvis W Contrast  Result Date: 01/15/2016 CLINICAL DATA:  Diarrhea for 2 days, abdominal pain in the epigastric region EXAM: CT ABDOMEN AND PELVIS WITH CONTRAST TECHNIQUE: Multidetector CT imaging of the abdomen and pelvis was performed using the standard protocol following bolus administration of intravenous contrast. CONTRAST:  100mL ISOVUE-300 IOPAMIDOL (ISOVUE-300) INJECTION 61% COMPARISON:  CT abdomen pelvis of 12/06/2011 FINDINGS: Lower chest: The lung bases are clear. Hepatobiliary: The liver enhances with no focal abnormality and no ductal dilatation is seen. Surgical clips are present from prior cholecystectomy. Pancreas: The pancreas is not enlarged and the pancreatic duct is prominent but stable post cholecystectomy. Spleen: The spleen is within normal limits in size. Adrenals/Urinary Tract: The kidneys enhance with no calculus or mass and there is no evidence of hydronephrosis. The distal ureters do not appear to be dilated. The urinary bladder is moderately well distended and is slightly thick-walled. Edema cannot be excluded as with cystitis. Stomach/Bowel: The stomach is distended with food debris and fluid with no abnormality noted. No small bowel distention is seen. The terminal ileum is unremarkable. The appendix may be faintly visualized on the sagittal images with no abnormality noted.  Vascular/Lymphatic: The abdominal aorta is normal in caliber. No adenopathy is seen. Reproductive: The uterus is normal in size. A small amount of fluid is noted within the endocervical canal, probably related to the patient's menstrual cycle. A probable collapsing right ovarian cyst is present. Only a tiny amount of free fluid is noted in the pelvis. Other: None Musculoskeletal: The lumbar vertebrae are in normal alignment with normal intervertebral disc spaces. IMPRESSION: 1. No explanation for the patient's diarrhea is seen. No edema of large or small bowel is noted. 2. Slightly thick-walled urinary bladder of questionable significance. Edema cannot be excluded as with cystitis. 3. Probable collapsing right ovarian cyst. Small amount of fluid within the endocervical canal. Electronically Signed   By: Dwyane DeePaul  Barry M.D.   On: 01/15/2016 09:46    Procedures Procedures (including critical care time)  Medications Ordered in ED Medications  sodium chloride 0.9 % bolus 1,000 mL (0 mLs Intravenous Stopped 01/15/16 0826)  morphine 4 MG/ML injection 4 mg (4 mg Intravenous Given 01/15/16 0713)  iopamidol (ISOVUE-300) 61 % injection 15 mL (15 mLs Oral Contrast Given 01/15/16 0730)  iopamidol (ISOVUE-300) 61 % injection 100 mL (100 mLs Intravenous Contrast Given 01/15/16 0918)  promethazine (PHENERGAN) injection 25 mg (25 mg Intravenous Given 01/15/16 1107)     Initial Impression / Assessment and Plan / ED Course  I have reviewed the triage vital signs and the nursing notes.  Pertinent labs & imaging results that were available during my care of the patient were reviewed by me and considered in my medical decision making (see chart for details).  Clinical Course    While the patient follow up with her GI doctor.  She is feeling some better following IV fluids and IV medications.  Patient is advised return here for any worsening in her condition.  She is advised to slowly increase her fluid intake.  The  patient agrees the plan and all questions were answered  Final Clinical Impressions(s) / ED Diagnoses   Final diagnoses:  None    New Prescriptions New  Prescriptions   No medications on file     Charlestine Night, PA-C 01/15/16 1456    Mancel Bale, MD 01/15/16 1654

## 2016-01-15 NOTE — Discharge Instructions (Signed)
Return here as needed.  Follow-up with your GI doctor.  Slowly increase your fluid intake

## 2016-04-01 DIAGNOSIS — H04123 Dry eye syndrome of bilateral lacrimal glands: Secondary | ICD-10-CM | POA: Diagnosis not present

## 2016-04-01 DIAGNOSIS — H16002 Unspecified corneal ulcer, left eye: Secondary | ICD-10-CM | POA: Diagnosis not present

## 2016-04-04 DIAGNOSIS — H16002 Unspecified corneal ulcer, left eye: Secondary | ICD-10-CM | POA: Diagnosis not present

## 2016-04-04 DIAGNOSIS — H04123 Dry eye syndrome of bilateral lacrimal glands: Secondary | ICD-10-CM | POA: Diagnosis not present

## 2016-04-11 DIAGNOSIS — H04123 Dry eye syndrome of bilateral lacrimal glands: Secondary | ICD-10-CM | POA: Diagnosis not present

## 2016-04-11 DIAGNOSIS — H16002 Unspecified corneal ulcer, left eye: Secondary | ICD-10-CM | POA: Diagnosis not present

## 2016-04-15 DIAGNOSIS — H16002 Unspecified corneal ulcer, left eye: Secondary | ICD-10-CM | POA: Diagnosis not present

## 2016-04-15 DIAGNOSIS — H04123 Dry eye syndrome of bilateral lacrimal glands: Secondary | ICD-10-CM | POA: Diagnosis not present

## 2016-04-24 DIAGNOSIS — H04123 Dry eye syndrome of bilateral lacrimal glands: Secondary | ICD-10-CM | POA: Diagnosis not present

## 2016-04-24 DIAGNOSIS — H16002 Unspecified corneal ulcer, left eye: Secondary | ICD-10-CM | POA: Diagnosis not present

## 2016-04-28 DIAGNOSIS — H04123 Dry eye syndrome of bilateral lacrimal glands: Secondary | ICD-10-CM | POA: Diagnosis not present

## 2016-04-28 DIAGNOSIS — H16002 Unspecified corneal ulcer, left eye: Secondary | ICD-10-CM | POA: Diagnosis not present

## 2016-05-01 DIAGNOSIS — H16002 Unspecified corneal ulcer, left eye: Secondary | ICD-10-CM | POA: Diagnosis not present

## 2016-05-05 DIAGNOSIS — H04123 Dry eye syndrome of bilateral lacrimal glands: Secondary | ICD-10-CM | POA: Diagnosis not present

## 2016-05-05 DIAGNOSIS — H16002 Unspecified corneal ulcer, left eye: Secondary | ICD-10-CM | POA: Diagnosis not present

## 2016-05-12 DIAGNOSIS — R112 Nausea with vomiting, unspecified: Secondary | ICD-10-CM | POA: Diagnosis not present

## 2016-05-12 DIAGNOSIS — Z79899 Other long term (current) drug therapy: Secondary | ICD-10-CM | POA: Insufficient documentation

## 2016-05-12 DIAGNOSIS — Z91010 Allergy to peanuts: Secondary | ICD-10-CM | POA: Diagnosis not present

## 2016-05-12 DIAGNOSIS — I10 Essential (primary) hypertension: Secondary | ICD-10-CM | POA: Insufficient documentation

## 2016-05-12 DIAGNOSIS — R197 Diarrhea, unspecified: Secondary | ICD-10-CM | POA: Diagnosis not present

## 2016-05-12 DIAGNOSIS — E109 Type 1 diabetes mellitus without complications: Secondary | ICD-10-CM | POA: Insufficient documentation

## 2016-05-13 ENCOUNTER — Encounter (HOSPITAL_COMMUNITY): Payer: Self-pay

## 2016-05-13 ENCOUNTER — Emergency Department (HOSPITAL_COMMUNITY)
Admission: EM | Admit: 2016-05-13 | Discharge: 2016-05-13 | Disposition: A | Discharge status: Home / Self Care | Payer: 59 | Attending: Emergency Medicine | Admitting: Emergency Medicine

## 2016-05-13 DIAGNOSIS — R197 Diarrhea, unspecified: Secondary | ICD-10-CM

## 2016-05-13 DIAGNOSIS — R112 Nausea with vomiting, unspecified: Secondary | ICD-10-CM

## 2016-05-13 LAB — COMPREHENSIVE METABOLIC PANEL
ALT: 16 U/L (ref 14–54)
AST: 23 U/L (ref 15–41)
Albumin: 3.2 g/dL — ABNORMAL LOW (ref 3.5–5.0)
Alkaline Phosphatase: 92 U/L (ref 38–126)
Anion gap: 12 (ref 5–15)
BUN: 10 mg/dL (ref 6–20)
CO2: 20 mmol/L — ABNORMAL LOW (ref 22–32)
Calcium: 8.6 mg/dL — ABNORMAL LOW (ref 8.9–10.3)
Chloride: 103 mmol/L (ref 101–111)
Creatinine, Ser: 0.59 mg/dL (ref 0.44–1.00)
GFR calc Af Amer: >60 mL/min (ref >60)
GFR calc non Af Amer: >60 mL/min (ref >60)
Glucose, Bld: 273 mg/dL — ABNORMAL HIGH (ref 65–99)
Potassium: 3.7 mmol/L (ref 3.5–5.1)
Sodium: 135 mmol/L (ref 135–145)
Total Bilirubin: 1.8 mg/dL — ABNORMAL HIGH (ref 0.3–1.2)
Total Protein: 6.8 g/dL (ref 6.5–8.1)

## 2016-05-13 LAB — URINALYSIS, ROUTINE W REFLEX MICROSCOPIC
Bacteria, UA: NONE SEEN
Bilirubin Urine: NEGATIVE
Glucose, UA: >=500 mg/dL — AB
Ketones, ur: 80 mg/dL — AB
Leukocytes, UA: NEGATIVE
Nitrite: NEGATIVE
Protein, ur: NEGATIVE mg/dL
Specific Gravity, Urine: 1.031 — ABNORMAL HIGH (ref 1.005–1.030)
pH: 6 (ref 5.0–8.0)

## 2016-05-13 LAB — CBC
HCT: 32.3 % — ABNORMAL LOW (ref 36.0–46.0)
Hemoglobin: 11.4 g/dL — ABNORMAL LOW (ref 12.0–15.0)
MCH: 31.4 pg (ref 26.0–34.0)
MCHC: 35.3 g/dL (ref 30.0–36.0)
MCV: 89 fL (ref 78.0–100.0)
Platelets: 238 10*3/uL (ref 150–400)
RBC: 3.63 MIL/uL — ABNORMAL LOW (ref 3.87–5.11)
RDW: 12.9 % (ref 11.5–15.5)
WBC: 5.2 10*3/uL (ref 4.0–10.5)

## 2016-05-13 LAB — CBG MONITORING, ED: Glucose-Capillary: 196 mg/dL — ABNORMAL HIGH (ref 65–99)

## 2016-05-13 LAB — LIPASE, BLOOD: Lipase: 53 U/L — ABNORMAL HIGH (ref 11–51)

## 2016-05-13 MED ORDER — METOCLOPRAMIDE HCL 10 MG PO TABS: 10.0000 mg | ORAL_TABLET | Freq: Four times a day (QID) | ORAL | 0 refills | Status: DC | PRN

## 2016-05-13 MED ORDER — METOCLOPRAMIDE HCL 5 MG/ML IJ SOLN
10.0000 mg | Freq: Once | INTRAMUSCULAR | Status: AC
  Administered 2016-05-13: 10 mg via INTRAVENOUS
  Filled 2016-05-13: qty 2

## 2016-05-13 MED ORDER — SODIUM CHLORIDE 0.9 % IV BOLUS (SEPSIS)
1000.0000 mL | Freq: Once | INTRAVENOUS | Status: AC
  Administered 2016-05-13: 1000 mL via INTRAVENOUS

## 2016-05-13 MED ORDER — ONDANSETRON 4 MG PO TBDP
4.0000 mg | ORAL_TABLET | Freq: Once | ORAL | Status: AC | PRN
  Administered 2016-05-13: 4 mg via ORAL
  Filled 2016-05-13: qty 1

## 2016-05-13 MED ORDER — ONDANSETRON HCL 4 MG/2ML IJ SOLN: 4.0000 mg | Freq: Once | INTRAMUSCULAR | Status: DC

## 2016-05-13 NOTE — ED Notes (Signed)
Patient ambulatory to restroom with standby assist to collect urine sample.

## 2016-05-13 NOTE — Discharge Instructions (Signed)
Reglan as needed for nausea/vomiting.  Keep a VERY close eye on your blood sugars.  Increase hydration.   Please seek immediate care if you develop any of the following symptoms: The pain does not go away.  You have a fever.  You keep throwing up (vomiting) and are unable to keep fluids down.  You pass bloody or black tarry stools.  There is bright red blood in the stool.  Extreme changes in blood sugars, very low or very high.  You do not seem to be getting better.  You have any questions or concerns.

## 2016-05-13 NOTE — ED Notes (Signed)
Patient verbalizes understanding of discharge instructions home care and follow up care. Patient ambulatory out of department at this time.  

## 2016-05-13 NOTE — ED Triage Notes (Signed)
Pt complains of vomiting and diarrhea for three days

## 2016-05-13 NOTE — ED Provider Notes (Signed)
[facility]-EMERGENCY DEPT Provider Note   CSN: [id number] Arrival date & time: 05/12/16  2345     History   Chief Complaint Chief Complaint  Patient presents with  . Emesis  . Diarrhea    HPI [name] is a 26 y.o. female.  The history is provided by the patient and medical records. No language interpreter was used.    [name] is a 26 y.o. female  with a PMH of DM1, gastroparesis, HTN who presents to the Emergency Department complaining of nausea, vomiting, nonbloody diarrhea 4-5 days. Associated symptoms include upper crampy abdominal pain. For the last day or 2, she has been unable to keep her fluids down. Her sister-in-law and sister-in-law's children are sick with stomach bug. She took Pepto-Bismol yesterday which actually made her symptoms worse.  She takes Lantus, 16 units twice a day as well as Humalog sliding scale with meals. She notes her blood sugars have been "all over the place" low in the 60s and high 200s. She denies fever or chills. No cough, congestion, chest pain, shortness of breath, back pain, dysuria.   Past Medical History:  Diagnosis Date  . Anxiety   . Diabetes mellitus   . Diabetes type 1, uncontrolled (HCC) 11/14/2011   Since age 69  . Fibromyalgia   . Gastroparesis   . Hypertension   . Infection    UTI April 2016    Patient Active Problem List   Diagnosis Date Noted  . Forceps or vacuum extractor delivery 01/26/2015  . Pregnant 01/24/2015  . Hypertension in pregnancy, antepartum 01/16/2015  . Pre-existing essential hypertension complicating pregnancy in second trimester   . Abnormal first trimester screen   . Encounter for fetal anatomic survey   . [redacted] weeks gestation of pregnancy   . Encounter for (NT) nuchal translucency scan   . First trimester screening   . [redacted] weeks gestation of pregnancy   . Pre-existing type 1 diabetes mellitus during pregnancy in first trimester   . Hypertension in pregnancy   . DKA (diabetic ketoacidoses)  (HCC) 06/11/2013  . DKA, type 1 (HCC) 06/11/2013  . Orthostatic hypotension dysautonomic syndrome (HCC) 04/28/2013  . Body aches 04/28/2013  . Orthostatic hypotension 04/28/2013  . Diabetes mellitus type 1 (HCC) 04/28/2013  . Protein-calorie malnutrition, severe (HCC) 04/28/2013  . Noncompliance with diabetes treatment 09/01/2012  . Epigastric abdominal pain 03/15/2012  . Dyspnea 02/28/2012  . Sinus tachycardia 02/28/2012  . Hyperglycemia without ketosis 02/28/2012  . Chest pain 12/04/2011  . Diabetes type 1, uncontrolled (HCC) 11/14/2011  . Hypertension associated with diabetes (HCC) 11/14/2011  . Gastroparesis 11/14/2011  . Hyponatremia 09/19/2011  . Abdominal pain 09/18/2011  . Metabolic alkalosis 09/18/2011  . Diabetic hyperosmolar non-ketotic state (HCC) 09/18/2011  . DIAB W/UNSPEC COMP TYPE I [JUV TYPE] UNCNTRL 06/04/2010  . CONSTIPATION 01/01/2010  . RECTAL PAIN 01/01/2010    Past Surgical History:  Procedure Laterality Date  . ANKLE SURGERY    . CHOLECYSTECTOMY  11/15/2011   Procedure: LAPAROSCOPIC CHOLECYSTECTOMY WITH INTRAOPERATIVE CHOLANGIOGRAM;  Surgeon: [name], MD;  Location: [facility];  Service: General;  Laterality: N/A;  . ESOPHAGOGASTRODUODENOSCOPY  12/03/2011   Procedure: ESOPHAGOGASTRODUODENOSCOPY (EGD);  Surgeon: Theda Belfast, MD;  Location: [facility];  Service: Endoscopy;  Laterality: N/A;  . LAPAROSCOPY  11/23/2011   Procedure: LAPAROSCOPY DIAGNOSTIC;  Surgeon: Mariella Saa, MD;  Location: [facility];  Service: General;  Laterality: N/A;    OB History    Gravida Para Term Preterm  AB Living   1 1   1   1    SAB TAB Ectopic Multiple Live Births         0 1       Home Medications    Prior to Admission medications   Medication Sig Start Date End Date Taking? Authorizing Provider  albuterol (PROVENTIL HFA;VENTOLIN HFA) 108 (90 Base) MCG/ACT inhaler Inhale 2 puffs into the lungs every 6 (six) hours as needed for wheezing or shortness of  breath.   Yes Historical Provider, MD  bismuth subsalicylate (PEPTO BISMOL) 262 MG chewable tablet Chew 524 mg by mouth daily as needed for diarrhea or loose stools.   Yes Historical Provider, MD  calcium carbonate (TUMS - DOSED IN MG ELEMENTAL CALCIUM) 500 MG chewable tablet Chew 2 tablets by mouth daily as needed for indigestion or heartburn.   Yes Historical Provider, MD  EPINEPHrine (EPIPEN 2-PAK) 0.3 mg/0.3 mL IJ SOAJ injection Inject 0.3 mLs (0.3 mg total) into the muscle once. 08/10/14  Yes Rachelle A Denney, CNM  glucose 4 GM chewable tablet Chew 1 tablet (4 g total) by mouth as needed for low blood sugar. 08/10/14  Yes Rachelle A Denney, CNM  Ibuprofen (MIDOL) 200 MG CAPS Take 400 mg by mouth daily as needed (pain).   Yes Historical Provider, MD  insulin glargine (LANTUS) 100 UNIT/ML injection Inject 16 Units into the skin 2 (two) times daily. Sometimes has to adjust based on BG levels   Yes Historical Provider, MD  insulin lispro (HUMALOG) 100 UNIT/ML injection Inject 8 Units into the skin 3 (three) times daily as needed for high blood sugar. Patient uses sliding scale.  Always uses 8 units but could be more.   Yes Historical Provider, MD  ketotifen (ZADITOR) 0.025 % ophthalmic solution Place 1 drop into both eyes 2 (two) times daily as needed (dry eyes).   Yes Historical Provider, MD  Loperamide HCl (IMODIUM A-D) 1 MG/7.5ML LIQD Take 1 mg by mouth daily as needed (diarrhea).   Yes Historical Provider, MD  prednisoLONE acetate (PRED FORTE) 1 % ophthalmic suspension Place 1 drop into both eyes 4 (four) times daily. 05/01/16  Yes Historical Provider, MD  PRESCRIPTION MEDICATION Place 1 drop into both eyes 4 (four) times daily. Voriconazole 1% Eye drop 04/24/16 05/24/16 Yes Historical Provider, MD  Propylene Glycol 0.6 % SOLN Apply 1 drop to eye 2 (two) times daily.   Yes Historical Provider, MD  TOBRAMYCIN SULFATE IJ 1 drop 4 (four) times daily. Both eyes 04/24/16 05/24/16 Yes Historical Provider, MD    vancomycin (VANCOCIN) 50 mg/mL oral solution Place 1 drop into both eyes 4 (four) times daily. 04/24/16 05/24/16 Yes Historical Provider, MD  cephALEXin (KEFLEX) 500 MG capsule Take 1 capsule (500 mg total) by mouth 2 (two) times daily. Patient not taking: Reported on 09/14/2015 05/09/15   Shon Baton, MD  ciprofloxacin (CIPRO) 500 MG tablet Take 1 tablet (500 mg total) by mouth 2 (two) times daily. Patient not taking: Reported on 09/14/2015 05/24/15   Roxy Horseman, PA-C  guaiFENesin-codeine 100-10 MG/5ML syrup Take 10 mLs by mouth every 8 (eight) hours as needed for cough. Patient not taking: Reported on 05/13/2016 04/29/15   Tilden Fossa, MD  hydrochlorothiazide (HYDRODIURIL) 25 MG tablet Take 1 tablet (25 mg total) by mouth daily. Patient not taking: Reported on 01/15/2016 01/29/15   Brock Bad, MD  ibuprofen (ADVIL,MOTRIN) 600 MG tablet Take 1 tablet (600 mg total) by mouth every 6 (six) hours as needed  for mild pain. Patient not taking: Reported on 04/30/2015 01/29/15   Brock Bad, MD  labetalol (NORMODYNE) 300 MG tablet Take 1 tablet (300 mg total) by mouth 3 (three) times daily. Patient not taking: Reported on 01/15/2016 01/29/15   Brock Bad, MD  metoCLOPramide (REGLAN) 10 MG tablet Take 1 tablet (10 mg total) by mouth every 6 (six) hours as needed for nausea or vomiting. 05/13/16   Chase Picket Stepehn Eckard, PA-C  Norethindrone Acetate-Ethinyl Estrad-FE (LOESTRIN 24 FE) 1-20 MG-MCG(24) tablet Take 1 tablet by mouth daily. Patient not taking: Reported on 09/14/2015 03/29/15   Brock Bad, MD  ondansetron (ZOFRAN) 4 MG tablet Take 1 tablet (4 mg total) by mouth every 8 (eight) hours as needed for nausea or vomiting. Patient not taking: Reported on 05/13/2016 04/30/15   Fayrene Helper, PA-C  oxyCODONE-acetaminophen (PERCOCET/ROXICET) 5-325 MG tablet Take 2 tablets by mouth every 4 (four) hours as needed for severe pain. Patient not taking: Reported on 01/15/2016 09/14/15   Cheri Fowler, PA-C  phenazopyridine (PYRIDIUM) 200 MG tablet Take 1 tablet (200 mg total) by mouth 3 (three) times daily as needed for pain. Patient not taking: Reported on 09/14/2015 05/09/15   Shon Baton, MD  potassium chloride SA (K-DUR,KLOR-CON) 20 MEQ tablet Take 1 tablet (20 mEq total) by mouth daily. Patient not taking: Reported on 09/14/2015 04/30/15   Fayrene Helper, PA-C  Prenat w/o A-FeCbn-Meth-FA-DHA (OB COMPLETE GOLD) 27.5-1-200 MG CAPS Take 1 tablet by mouth daily. Patient not taking: Reported on 09/14/2015 09/15/14   Rachelle A Denney, CNM  promethazine (PHENERGAN) 25 MG tablet Take 1 tablet (25 mg total) by mouth every 8 (eight) hours as needed for nausea or vomiting. Patient not taking: Reported on 05/13/2016 01/15/16   Charlestine Night, PA-C  ranitidine (ZANTAC) 150 MG capsule Take 1 capsule (150 mg total) by mouth 2 (two) times daily. Patient not taking: Reported on 09/14/2015 01/02/15   Brock Bad, MD  traMADol (ULTRAM) 50 MG tablet Take 1 tablet (50 mg total) by mouth every 6 (six) hours as needed for severe pain. Patient not taking: Reported on 05/13/2016 01/15/16   Charlestine Night, PA-C    Family History Family History  Problem Relation Age of Onset  . Diabetes Mother   . Hypertension Father     Social History Social History  Substance Use Topics  . Smoking status: Never Smoker  . Smokeless tobacco: Never Used  . Alcohol use 0.0 oz/week     Allergies   Peanut-containing drug products and Lactose intolerance (gi)   Review of Systems Review of Systems  Constitutional: Negative for chills and fever.  HENT: Negative for congestion.   Eyes: Negative for visual disturbance.  Respiratory: Negative for cough and shortness of breath.   Cardiovascular: Negative.   Gastrointestinal: Positive for abdominal pain, diarrhea, nausea and vomiting. Negative for blood in stool.  Genitourinary: Negative for dysuria and vaginal discharge.  Musculoskeletal: Negative for back  pain and neck pain.  Skin: Negative for rash.  Neurological: Negative for headaches.     Physical Exam Updated Vital Signs BP 117/80 (BP Location: Right Arm)   Pulse 105   Temp 98 F (36.7 C) (Oral)   Resp 14   LMP 04/16/2016   SpO2 98%   Physical Exam  Constitutional: She is oriented to person, place, and time. She appears well-developed and well-nourished. No distress.  HENT:  Head: Normocephalic and atraumatic.  Tacky mucous membranes.  Cardiovascular: Normal rate, regular rhythm and normal  heart sounds.   No murmur heard. Pulmonary/Chest: Effort normal and breath sounds normal. No respiratory distress. She has no wheezes. She has no rales.  Abdominal: Soft. She exhibits no distension. There is no tenderness.  Generalized abdominal tenderness most significantly of the epigastrium without rebound or guarding.  Musculoskeletal: She exhibits no edema.  Neurological: She is alert and oriented to person, place, and time.  Skin: Skin is warm and dry.  Nursing note and vitals reviewed.    ED Treatments / Results  Labs (all labs ordered are listed, but only abnormal results are displayed) Labs Reviewed  LIPASE, BLOOD - Abnormal; Notable for the following:       Result Value   Lipase 53 (*)    All other components within normal limits  COMPREHENSIVE METABOLIC PANEL - Abnormal; Notable for the following:    CO2 20 (*)    Glucose, Bld 273 (*)    Calcium 8.6 (*)    Albumin 3.2 (*)    Total Bilirubin 1.8 (*)    All other components within normal limits  CBC - Abnormal; Notable for the following:    RBC 3.63 (*)    Hemoglobin 11.4 (*)    HCT 32.3 (*)    All other components within normal limits  URINALYSIS, ROUTINE W REFLEX MICROSCOPIC - Abnormal; Notable for the following:    Specific Gravity, Urine 1.031 (*)    Glucose, UA >=500 (*)    Hgb urine dipstick SMALL (*)    Ketones, ur 80 (*)    Squamous Epithelial / LPF 0-5 (*)    All other components within normal limits    CBG MONITORING, ED - Abnormal; Notable for the following:    Glucose-Capillary 196 (*)    All other components within normal limits    EKG  EKG Interpretation None       Radiology No results found.  Procedures Procedures (including critical care time)  Medications Ordered in ED Medications  ondansetron (ZOFRAN-ODT) disintegrating tablet 4 mg (4 mg Oral Given 05/13/16 0014)  sodium chloride 0.9 % bolus 1,000 mL (1,000 mLs Intravenous New Bag/Given 05/13/16 0337)  metoCLOPramide (REGLAN) injection 10 mg (10 mg Intravenous Given 05/13/16 0338)     Initial Impression / Assessment and Plan / ED Course  I have reviewed the triage vital signs and the nursing notes.  Pertinent labs & imaging results that were available during my care of the patient were reviewed by me and considered in my medical decision making (see chart for details).    [name] is a 26 y.o. female who presents to ED for generalized abdominal pain, nausea, vomiting, diarrhea x 4-5 days. Over the last day, she has not been able to tolerate PO prompting her to come to ED. On exam, she does appear mildly dehydrated - 2L IV fluids given. She is afebrile, hemodynamically stable with a nonsurgical abdomen. Lipase is minimally elevated at 53, she has had elevated lipase in the past as well. Doubt this is pancreatitis given minimal elevation and history/exam findings. Glucose of 273, CO2 of 20. She has a normal anion gap. He has had slightly lower CO2 in the past when presented to the ER for another, noncontributory complaints and in the setting of glucose in the 100's. Urine with elevated specific gravity and 80 ketones. She does have multiple sick contacts with a stomach bug in her symptoms appear more consistent with gastroenteritis versus DKA/gastroparesis.  After 2 L of fluid and Reglan, patient states that  she feels very much improved. Repeat CBG of 196. Patient is tolerating by mouth and feels comfortable with  discharge to home. Significant amount of time was taken to discuss home care instructions including very close monitoring of her blood sugars. If she is unable to keep down fluids/foods or unable to control blood sugars, she understands to return to the Emergency Department. All questions answered.  Patient discussed with Dr. Blinda Leatherwood who agrees with treatment plan.    Final Clinical Impressions(s) / ED Diagnoses   Final diagnoses:  Nausea vomiting and diarrhea    New Prescriptions New Prescriptions   METOCLOPRAMIDE (REGLAN) 10 MG TABLET    Take 1 tablet (10 mg total) by mouth every 6 (six) hours as needed for nausea or vomiting.     [facility] Valin Massie, PA-C 05/13/16 6010    Gilda Crease, MD 05/13/16 [phone]

## 2016-05-16 ENCOUNTER — Encounter (HOSPITAL_COMMUNITY): Payer: Self-pay

## 2016-05-16 ENCOUNTER — Emergency Department (HOSPITAL_COMMUNITY)
Admission: EM | Admit: 2016-05-16 | Discharge: 2016-05-16 | Disposition: A | Discharge status: Home / Self Care | Payer: 59 | Attending: Emergency Medicine | Admitting: Emergency Medicine

## 2016-05-16 DIAGNOSIS — Z91010 Allergy to peanuts: Secondary | ICD-10-CM | POA: Insufficient documentation

## 2016-05-16 DIAGNOSIS — M799 Soft tissue disorder, unspecified: Secondary | ICD-10-CM

## 2016-05-16 DIAGNOSIS — R1900 Intra-abdominal and pelvic swelling, mass and lump, unspecified site: Secondary | ICD-10-CM | POA: Diagnosis not present

## 2016-05-16 DIAGNOSIS — I10 Essential (primary) hypertension: Secondary | ICD-10-CM | POA: Diagnosis not present

## 2016-05-16 DIAGNOSIS — E109 Type 1 diabetes mellitus without complications: Secondary | ICD-10-CM | POA: Diagnosis not present

## 2016-05-16 NOTE — Discharge Instructions (Signed)
Your ultrasound today did not show any evidence of abscess. I recommend that you follow-up closely with her primary care doctor for further evaluation/management. I've also given you information for the local surgery clinic as well. Please return here for any new or worsening symptoms.

## 2016-05-16 NOTE — ED Provider Notes (Signed)
[facility]-EMERGENCY DEPT Provider Note   CSN: [id number] Arrival date & time: 05/16/16  1754     History   Chief Complaint Chief Complaint  Patient presents with  . Mass    HPI [name] is a 26 y.o. female.  The history is provided by the patient and medical records.    26 year old female with history of anxiety, diabetes, gastroparesis, hypertension, presenting to the ED for "mass" on the right side of her abdomen. States she notices today while she was at work. States it feels mildly "sore" but she is not having any true pain. She denies any redness or skin discoloration. No itching or drainage. No rash. States she has never noticed this in the last. She denies any fever or chills.  Past Medical History:  Diagnosis Date  . Anxiety   . Diabetes mellitus   . Diabetes type 1, uncontrolled (HCC) 11/14/2011   Since age 89  . Fibromyalgia   . Gastroparesis   . Hypertension   . Infection    UTI April 2016    Patient Active Problem List   Diagnosis Date Noted  . Forceps or vacuum extractor delivery 01/26/2015  . Pregnant 01/24/2015  . Hypertension in pregnancy, antepartum 01/16/2015  . Pre-existing essential hypertension complicating pregnancy in second trimester   . Abnormal first trimester screen   . Encounter for fetal anatomic survey   . [redacted] weeks gestation of pregnancy   . Encounter for (NT) nuchal translucency scan   . First trimester screening   . [redacted] weeks gestation of pregnancy   . Pre-existing type 1 diabetes mellitus during pregnancy in first trimester   . Hypertension in pregnancy   . DKA (diabetic ketoacidoses) (HCC) 06/11/2013  . DKA, type 1 (HCC) 06/11/2013  . Orthostatic hypotension dysautonomic syndrome (HCC) 04/28/2013  . Body aches 04/28/2013  . Orthostatic hypotension 04/28/2013  . Diabetes mellitus type 1 (HCC) 04/28/2013  . Protein-calorie malnutrition, severe (HCC) 04/28/2013  . Noncompliance with diabetes treatment 09/01/2012  . Epigastric  abdominal pain 03/15/2012  . Dyspnea 02/28/2012  . Sinus tachycardia 02/28/2012  . Hyperglycemia without ketosis 02/28/2012  . Chest pain 12/04/2011  . Diabetes type 1, uncontrolled (HCC) 11/14/2011  . Hypertension associated with diabetes (HCC) 11/14/2011  . Gastroparesis 11/14/2011  . Hyponatremia 09/19/2011  . Abdominal pain 09/18/2011  . Metabolic alkalosis 09/18/2011  . Diabetic hyperosmolar non-ketotic state (HCC) 09/18/2011  . DIAB W/UNSPEC COMP TYPE I [JUV TYPE] UNCNTRL 06/04/2010  . CONSTIPATION 01/01/2010  . RECTAL PAIN 01/01/2010    Past Surgical History:  Procedure Laterality Date  . ANKLE SURGERY    . CHOLECYSTECTOMY  11/15/2011   Procedure: LAPAROSCOPIC CHOLECYSTECTOMY WITH INTRAOPERATIVE CHOLANGIOGRAM;  Surgeon: [name], MD;  Location: [facility];  Service: General;  Laterality: N/A;  . ESOPHAGOGASTRODUODENOSCOPY  12/03/2011   Procedure: ESOPHAGOGASTRODUODENOSCOPY (EGD);  Surgeon: Theda Belfast, MD;  Location: [facility];  Service: Endoscopy;  Laterality: N/A;  . LAPAROSCOPY  11/23/2011   Procedure: LAPAROSCOPY DIAGNOSTIC;  Surgeon: Mariella Saa, MD;  Location: [facility];  Service: General;  Laterality: N/A;    OB History    Gravida Para Term Preterm AB Living   1 1   1   1    SAB TAB Ectopic Multiple Live Births         0 1       Home Medications    Prior to Admission medications   Medication Sig Start Date End Date Taking? Authorizing Provider  albuterol (PROVENTIL HFA;VENTOLIN  HFA) 108 (90 Base) MCG/ACT inhaler Inhale 2 puffs into the lungs every 6 (six) hours as needed for wheezing or shortness of breath.    Historical Provider, MD  bismuth subsalicylate (PEPTO BISMOL) 262 MG chewable tablet Chew 524 mg by mouth daily as needed for diarrhea or loose stools.    Historical Provider, MD  calcium carbonate (TUMS - DOSED IN MG ELEMENTAL CALCIUM) 500 MG chewable tablet Chew 2 tablets by mouth daily as needed for indigestion or heartburn.    Historical  Provider, MD  cephALEXin (KEFLEX) 500 MG capsule Take 1 capsule (500 mg total) by mouth 2 (two) times daily. Patient not taking: Reported on 09/14/2015 05/09/15   Shon Baton, MD  ciprofloxacin (CIPRO) 500 MG tablet Take 1 tablet (500 mg total) by mouth 2 (two) times daily. Patient not taking: Reported on 09/14/2015 05/24/15   Roxy Horseman, PA-C  EPINEPHrine (EPIPEN 2-PAK) 0.3 mg/0.3 mL IJ SOAJ injection Inject 0.3 mLs (0.3 mg total) into the muscle once. 08/10/14   Rachelle A Denney, CNM  glucose 4 GM chewable tablet Chew 1 tablet (4 g total) by mouth as needed for low blood sugar. 08/10/14   Rachelle A Denney, CNM  guaiFENesin-codeine 100-10 MG/5ML syrup Take 10 mLs by mouth every 8 (eight) hours as needed for cough. Patient not taking: Reported on 05/13/2016 04/29/15   Tilden Fossa, MD  hydrochlorothiazide (HYDRODIURIL) 25 MG tablet Take 1 tablet (25 mg total) by mouth daily. Patient not taking: Reported on 01/15/2016 01/29/15   Brock Bad, MD  ibuprofen (ADVIL,MOTRIN) 600 MG tablet Take 1 tablet (600 mg total) by mouth every 6 (six) hours as needed for mild pain. Patient not taking: Reported on 04/30/2015 01/29/15   Brock Bad, MD  Ibuprofen (MIDOL) 200 MG CAPS Take 400 mg by mouth daily as needed (pain).    Historical Provider, MD  insulin glargine (LANTUS) 100 UNIT/ML injection Inject 16 Units into the skin 2 (two) times daily. Sometimes has to adjust based on BG levels    Historical Provider, MD  insulin lispro (HUMALOG) 100 UNIT/ML injection Inject 8 Units into the skin 3 (three) times daily as needed for high blood sugar. Patient uses sliding scale.  Always uses 8 units but could be more.    Historical Provider, MD  ketotifen (ZADITOR) 0.025 % ophthalmic solution Place 1 drop into both eyes 2 (two) times daily as needed (dry eyes).    Historical Provider, MD  labetalol (NORMODYNE) 300 MG tablet Take 1 tablet (300 mg total) by mouth 3 (three) times daily. Patient not taking:  Reported on 01/15/2016 01/29/15   Brock Bad, MD  Loperamide HCl (IMODIUM A-D) 1 MG/7.5ML LIQD Take 1 mg by mouth daily as needed (diarrhea).    Historical Provider, MD  metoCLOPramide (REGLAN) 10 MG tablet Take 1 tablet (10 mg total) by mouth every 6 (six) hours as needed for nausea or vomiting. 05/13/16   Chase Picket Ward, PA-C  Norethindrone Acetate-Ethinyl Estrad-FE (LOESTRIN 24 FE) 1-20 MG-MCG(24) tablet Take 1 tablet by mouth daily. Patient not taking: Reported on 09/14/2015 03/29/15   Brock Bad, MD  ondansetron (ZOFRAN) 4 MG tablet Take 1 tablet (4 mg total) by mouth every 8 (eight) hours as needed for nausea or vomiting. Patient not taking: Reported on 05/13/2016 04/30/15   Fayrene Helper, PA-C  oxyCODONE-acetaminophen (PERCOCET/ROXICET) 5-325 MG tablet Take 2 tablets by mouth every 4 (four) hours as needed for severe pain. Patient not taking: Reported on 01/15/2016 09/14/15  Cheri Fowler, PA-C  phenazopyridine (PYRIDIUM) 200 MG tablet Take 1 tablet (200 mg total) by mouth 3 (three) times daily as needed for pain. Patient not taking: Reported on 09/14/2015 05/09/15   Shon Baton, MD  potassium chloride SA (K-DUR,KLOR-CON) 20 MEQ tablet Take 1 tablet (20 mEq total) by mouth daily. Patient not taking: Reported on 09/14/2015 04/30/15   Fayrene Helper, PA-C  prednisoLONE acetate (PRED FORTE) 1 % ophthalmic suspension Place 1 drop into both eyes 4 (four) times daily. 05/01/16   Historical Provider, MD  Prenat w/o A-FeCbn-Meth-FA-DHA (OB COMPLETE GOLD) 27.5-1-200 MG CAPS Take 1 tablet by mouth daily. Patient not taking: Reported on 09/14/2015 09/15/14   Roe Coombs, CNM  PRESCRIPTION MEDICATION Place 1 drop into both eyes 4 (four) times daily. Voriconazole 1% Eye drop 04/24/16 05/24/16  Historical Provider, MD  promethazine (PHENERGAN) 25 MG tablet Take 1 tablet (25 mg total) by mouth every 8 (eight) hours as needed for nausea or vomiting. Patient not taking: Reported on 05/13/2016 01/15/16    Charlestine Night, PA-C  Propylene Glycol 0.6 % SOLN Apply 1 drop to eye 2 (two) times daily.    Historical Provider, MD  ranitidine (ZANTAC) 150 MG capsule Take 1 capsule (150 mg total) by mouth 2 (two) times daily. Patient not taking: Reported on 09/14/2015 01/02/15   Brock Bad, MD  TOBRAMYCIN SULFATE IJ 1 drop 4 (four) times daily. Both eyes 04/24/16 05/24/16  Historical Provider, MD  traMADol (ULTRAM) 50 MG tablet Take 1 tablet (50 mg total) by mouth every 6 (six) hours as needed for severe pain. Patient not taking: Reported on 05/13/2016 01/15/16   Charlestine Night, PA-C  vancomycin (VANCOCIN) 50 mg/mL oral solution Place 1 drop into both eyes 4 (four) times daily. 04/24/16 05/24/16  Historical Provider, MD    Family History Family History  Problem Relation Age of Onset  . Diabetes Mother   . Hypertension Father     Social History Social History  Substance Use Topics  . Smoking status: Never Smoker  . Smokeless tobacco: Never Used  . Alcohol use 0.0 oz/week     Allergies   Peanut-containing drug products and Lactose intolerance (gi)   Review of Systems Review of Systems  Skin:       Mass?  All other systems reviewed and are negative.    Physical Exam Updated Vital Signs BP 94/60 (BP Location: Right Arm)   Pulse 70   Temp 98.2 F (36.8 C) (Oral)   Resp 18   Ht 5\' 7"  (1.702 m)   Wt 68 kg   LMP 04/16/2016   SpO2 100%   BMI 23.49 kg/m   Physical Exam  Constitutional: She is oriented to person, place, and time. She appears well-developed and well-nourished.  HENT:  Head: Normocephalic and atraumatic.  Mouth/Throat: Oropharynx is clear and moist.  Eyes: Conjunctivae and EOM are normal. Pupils are equal, round, and reactive to light.  Neck: Normal range of motion.  Cardiovascular: Normal rate, regular rhythm and normal heart sounds.   Pulmonary/Chest: Effort normal and breath sounds normal.  Abdominal: Soft. Bowel sounds are normal.  Right lower abdomen  with small, firm but mobile area beneath the skin that feels approx 1x1cm in size; there is no associated skin erythema or warmth to touch; area is non-tender; no associated rash; no drainage or swelling; no fluctuance No hernia appreciated  Musculoskeletal: Normal range of motion.  Neurological: She is alert and oriented to person, place, and time.  Skin:  Skin is warm and dry.  Psychiatric: She has a normal mood and affect.  Nursing note and vitals reviewed.    ED Treatments / Results  Labs (all labs ordered are listed, but only abnormal results are displayed) Labs Reviewed - No data to display  EKG  EKG Interpretation None       Radiology No results found.   Procedures Procedures (including critical care time)  EMERGENCY DEPARTMENT US SOFT TISSUE INTERPRETATION "Study: Limited Soft Tissue Ultrasound"  INDICATIONS: soft tissue mass Multiple views of the body part were obtained in real-time with a multi-frequency linear probe  PERFORMED BY: Myself IMAGES ARCHIVED?: Yes SIDE:Right  BODY PART:Abdominal wall INTERPRETATION:  No abcess noted     Medications Ordered in ED Medications - No data to display   Initial Impression / Assessment and Plan / ED Course  I have reviewed the triage vital signs and the nursing notes.  Pertinent labs & imaging results that were available during my care of the patient were reviewed by me and considered in my medical decision making (see chart for details).  26 year old female here with "mass" on the right side of her abdomen. Reports she first noticed this today. This area is nonpainful. No associated redness or swelling. No rash or itching. No drainage. On exam, there is a firm but mobile 1 x 1 cm area beneath the skin in the right lower abdomen. I do not appreciate any hernia.  [place] of soft tissue obtained-- no evidence of fluid collection suggestive of abscess.  This may represent soft tissue lipoma.  Will have patient follow-up  closely with her PCP next week.  I have also given her information for surgery clinic if needed.  Discussed plan with patient, she acknowledged understanding and agreed with plan of care.  Return precautions given for new or worsening symptoms.  Final Clinical Impressions(s) / ED Diagnoses   Final diagnoses:  Abnormality of soft tissue on examination    New Prescriptions Discharge Medication List as of 05/16/2016 11:48 PM       Garlon Hatchet, PA-C 05/17/16 0011    [name], MD 05/17/16 [id number]

## 2016-05-16 NOTE — ED Triage Notes (Signed)
Pt states that she has a lump on her L hip. She states that it is sore, but not reddened or itchy. Denies d/c or drainage. A&Ox4. Ambulatory.

## 2016-05-16 NOTE — ED Notes (Signed)
Swelling/ lump noted to R hip. Pt reports it is not painful to palpation at this time. Pt noticed the lump this morning.

## 2016-05-29 DIAGNOSIS — R194 Change in bowel habit: Secondary | ICD-10-CM | POA: Diagnosis not present

## 2016-05-29 DIAGNOSIS — K219 Gastro-esophageal reflux disease without esophagitis: Secondary | ICD-10-CM | POA: Diagnosis not present

## 2016-05-29 DIAGNOSIS — K3184 Gastroparesis: Secondary | ICD-10-CM | POA: Diagnosis not present

## 2016-06-06 DIAGNOSIS — H04123 Dry eye syndrome of bilateral lacrimal glands: Secondary | ICD-10-CM | POA: Diagnosis not present

## 2016-06-06 DIAGNOSIS — H16002 Unspecified corneal ulcer, left eye: Secondary | ICD-10-CM | POA: Diagnosis not present

## 2016-06-12 DIAGNOSIS — K3184 Gastroparesis: Secondary | ICD-10-CM | POA: Diagnosis not present

## 2016-06-12 DIAGNOSIS — K219 Gastro-esophageal reflux disease without esophagitis: Secondary | ICD-10-CM | POA: Diagnosis not present

## 2016-06-12 DIAGNOSIS — R194 Change in bowel habit: Secondary | ICD-10-CM | POA: Diagnosis not present

## 2016-07-22 DIAGNOSIS — S93602A Unspecified sprain of left foot, initial encounter: Secondary | ICD-10-CM | POA: Diagnosis not present

## 2016-08-25 DIAGNOSIS — K047 Periapical abscess without sinus: Secondary | ICD-10-CM | POA: Diagnosis not present

## 2016-08-25 DIAGNOSIS — J069 Acute upper respiratory infection, unspecified: Secondary | ICD-10-CM | POA: Diagnosis not present

## 2016-09-02 ENCOUNTER — Emergency Department (HOSPITAL_COMMUNITY): Payer: 59

## 2016-09-02 ENCOUNTER — Encounter (HOSPITAL_COMMUNITY): Payer: Self-pay | Admitting: Emergency Medicine

## 2016-09-02 ENCOUNTER — Emergency Department (HOSPITAL_COMMUNITY)
Admission: EM | Admit: 2016-09-02 | Discharge: 2016-09-02 | Disposition: A | Discharge status: Home / Self Care | Payer: 59 | Attending: Emergency Medicine | Admitting: Emergency Medicine

## 2016-09-02 DIAGNOSIS — R059 Cough, unspecified: Secondary | ICD-10-CM

## 2016-09-02 DIAGNOSIS — J069 Acute upper respiratory infection, unspecified: Secondary | ICD-10-CM | POA: Diagnosis not present

## 2016-09-02 DIAGNOSIS — Z79899 Other long term (current) drug therapy: Secondary | ICD-10-CM | POA: Insufficient documentation

## 2016-09-02 DIAGNOSIS — M940 Chondrocostal junction syndrome [Tietze]: Secondary | ICD-10-CM

## 2016-09-02 DIAGNOSIS — E109 Type 1 diabetes mellitus without complications: Secondary | ICD-10-CM | POA: Diagnosis not present

## 2016-09-02 DIAGNOSIS — Z91010 Allergy to peanuts: Secondary | ICD-10-CM | POA: Insufficient documentation

## 2016-09-02 DIAGNOSIS — I10 Essential (primary) hypertension: Secondary | ICD-10-CM | POA: Diagnosis not present

## 2016-09-02 DIAGNOSIS — R079 Chest pain, unspecified: Secondary | ICD-10-CM | POA: Diagnosis not present

## 2016-09-02 DIAGNOSIS — R0789 Other chest pain: Secondary | ICD-10-CM

## 2016-09-02 DIAGNOSIS — R058 Other specified cough: Secondary | ICD-10-CM

## 2016-09-02 DIAGNOSIS — R05 Cough: Secondary | ICD-10-CM | POA: Diagnosis not present

## 2016-09-02 IMAGING — CR DG CHEST 2V
2 series · 2 of 2 positions shown · non-contrast
Comparison: [DATE]

CLINICAL DATA: Chest pain and soreness. Cough and fever. Symptoms
for 2 days.

EXAM:
CHEST  2 VIEW

[w chest pa]
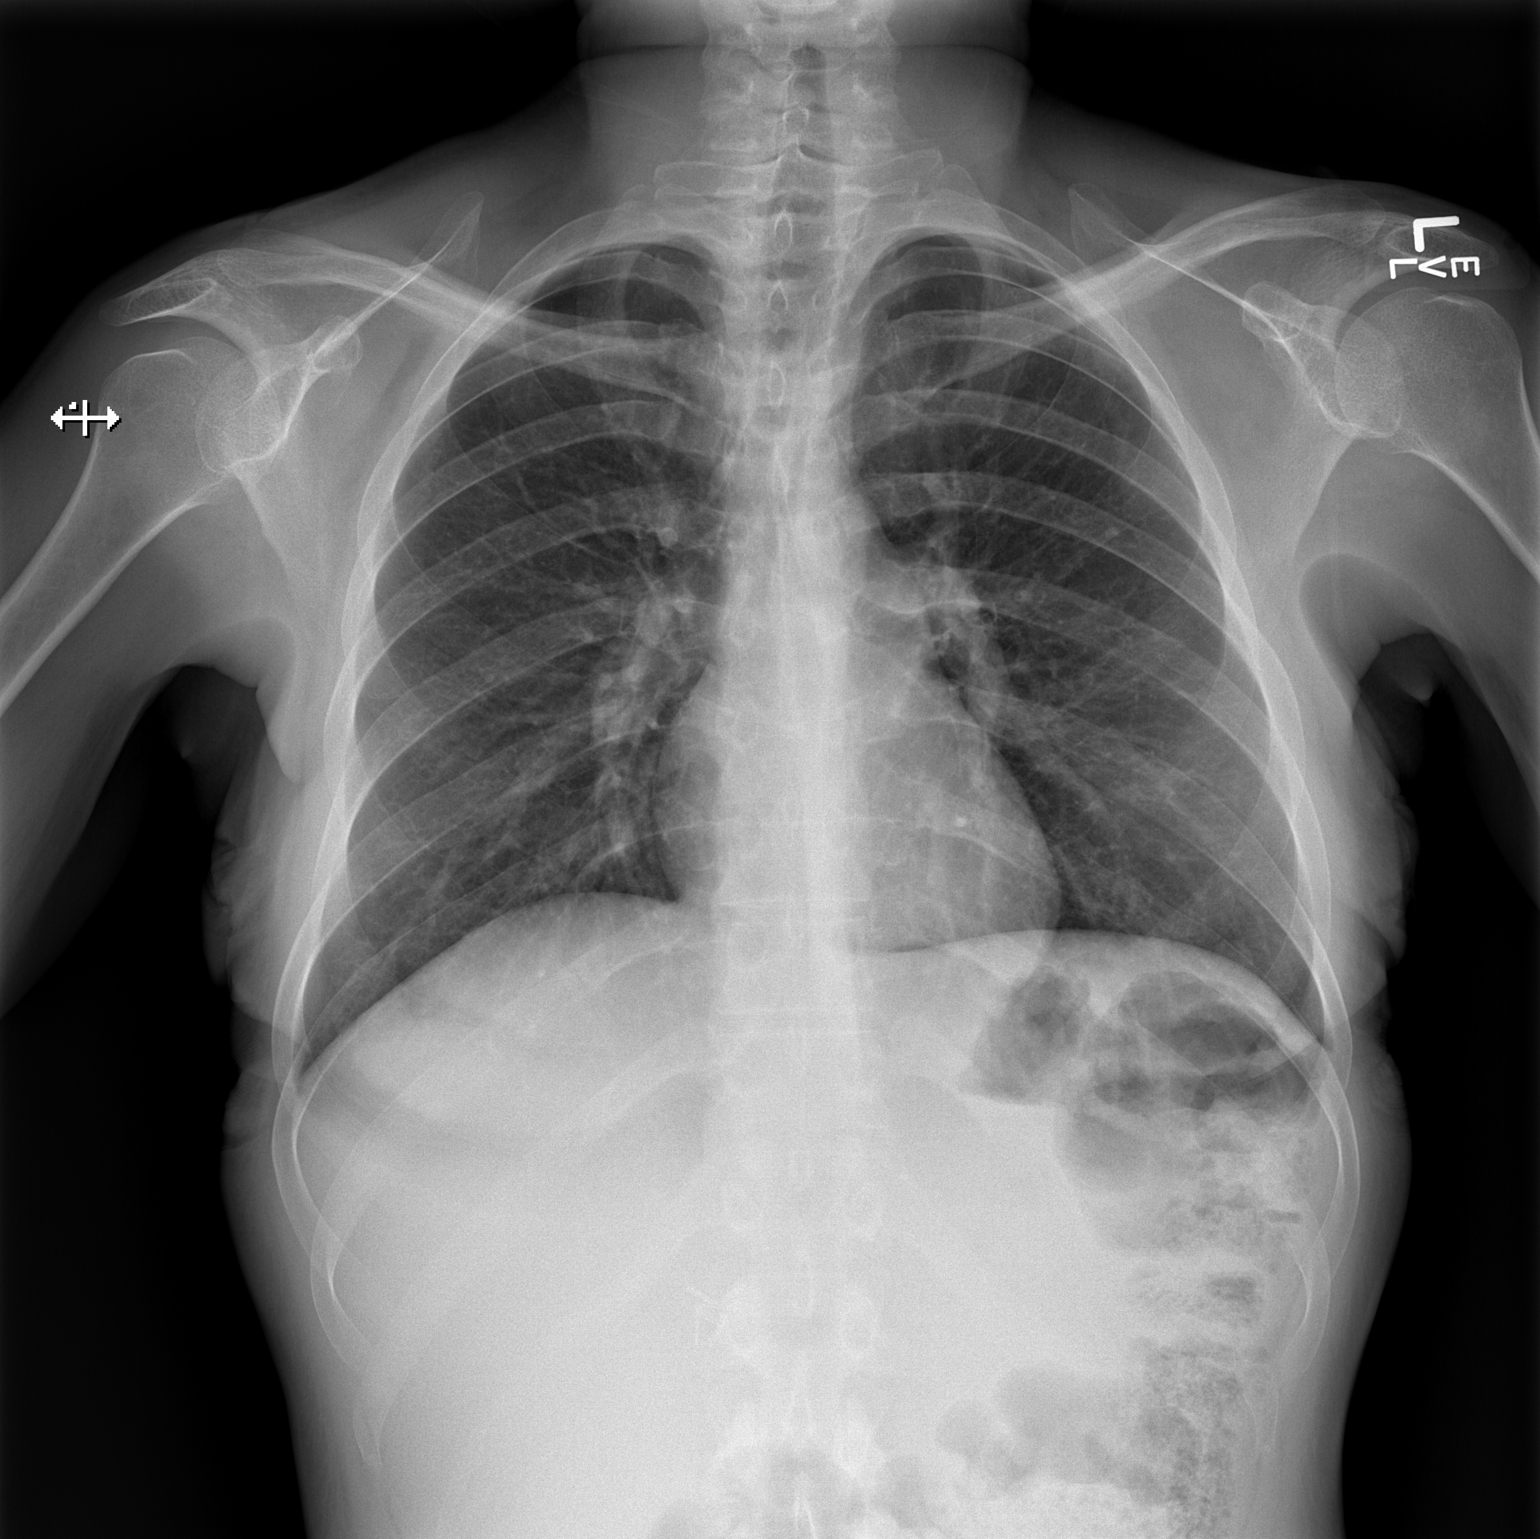

[w chest lat]
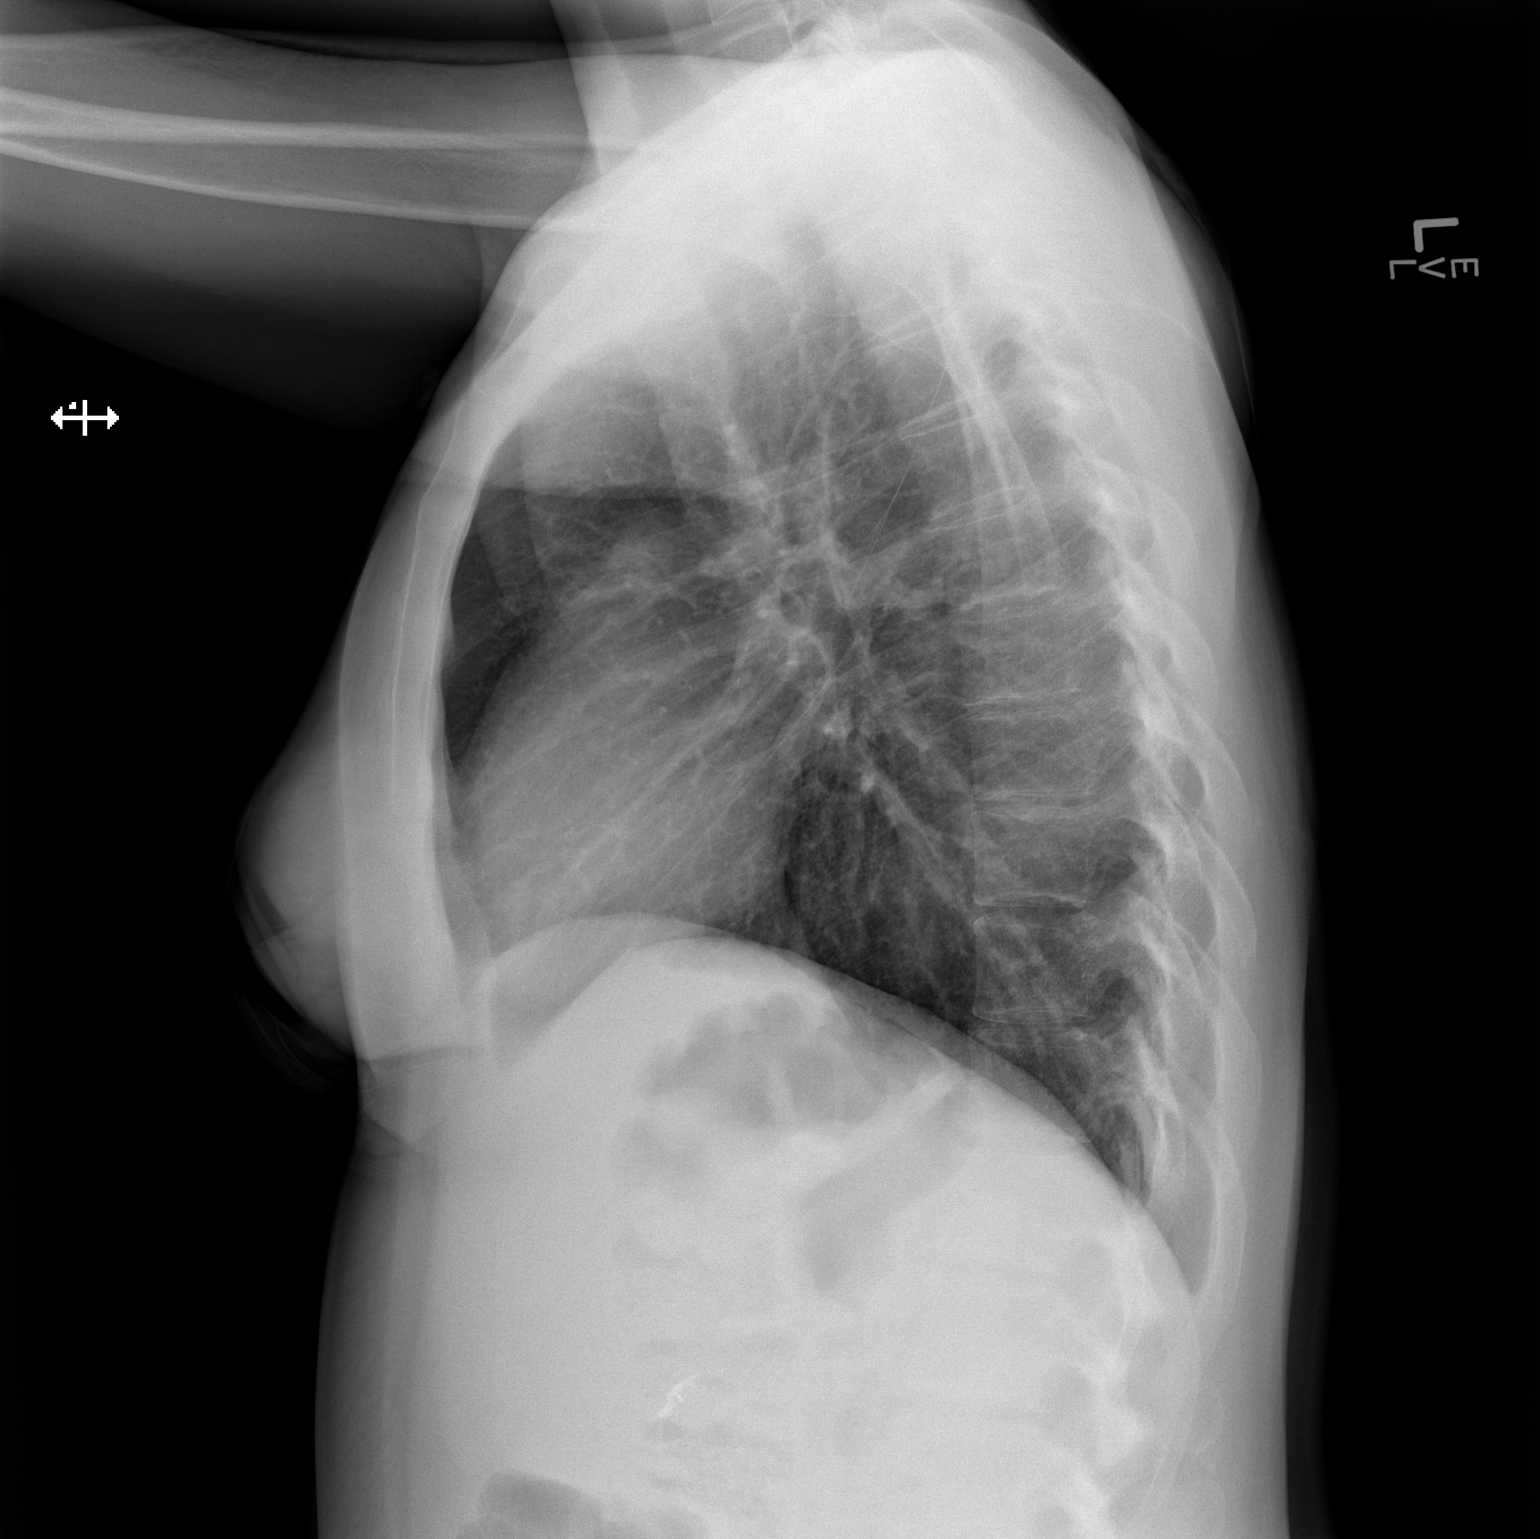

[2 of 2 positions shown; findings below may reference images not displayed]

FINDINGS: Normal heart size. Lungs clear. No pneumothorax. No pleural
effusion.
IMPRESSION: No active cardiopulmonary disease.

## 2016-09-02 MED ORDER — ALBUTEROL SULFATE (2.5 MG/3ML) 0.083% IN NEBU
5.0000 mg | INHALATION_SOLUTION | Freq: Once | RESPIRATORY_TRACT | Status: AC
  Administered 2016-09-02: 5 mg via RESPIRATORY_TRACT
  Filled 2016-09-02: qty 6

## 2016-09-02 MED ORDER — IPRATROPIUM BROMIDE 0.02 % IN SOLN
0.5000 mg | Freq: Once | RESPIRATORY_TRACT | Status: AC
  Administered 2016-09-02: 0.5 mg via RESPIRATORY_TRACT
  Filled 2016-09-02: qty 2.5

## 2016-09-02 MED ORDER — ALBUTEROL SULFATE HFA 108 (90 BASE) MCG/ACT IN AERS: 1.0000 | INHALATION_SPRAY | RESPIRATORY_TRACT | 0 refills | Status: DC | PRN

## 2016-09-02 MED ORDER — IBUPROFEN 800 MG PO TABS
800.0000 mg | ORAL_TABLET | Freq: Once | ORAL | Status: AC
  Administered 2016-09-02: 800 mg via ORAL
  Filled 2016-09-02: qty 1

## 2016-09-02 NOTE — ED Triage Notes (Signed)
Patient is complaining of cough and congestion. Patient states she got it from her little girl. She states she cant quit coughing and it is causing her chest to be sore.

## 2016-09-02 NOTE — ED Notes (Signed)
PT DISCHARGED. INSTRUCTIONS AND PRESCRIPTION GIVEN. AAOX4. PT IN NO APPARENT DISTRESS OR PAIN. THE OPPORTUNITY TO ASK QUESTIONS WAS PROVIDED. 

## 2016-09-02 NOTE — ED Provider Notes (Signed)
[facility]-EMERGENCY DEPT Provider Note   CSN: [id number] Arrival date & time: 09/02/16  1542  By signing my name below, I, Cynda Acres, attest that this documentation has been prepared under the direction and in the presence of  [address], [employer]. Electronically Signed: Cynda Acres, Scribe. 09/02/16. 5:31 PM.  History   Chief Complaint Chief Complaint  Patient presents with  . Cough    HPI Comments: [name] is a 26 y.o. female with a PMHx of DM1, fibromyalgia, gastroparesis, and HTN, who presents to the Emergency Department complaining of a gradual onset, persistent URI symptoms x11 days. States she initially she had a productive cough with greenish/yellow sputum, fever (101), and nasal congestion/rhinorrhea; she went to her PCP 8 days ago, they gave her augmentin and a decongestant which is helping but she continues to have the productive cough; the congestion has improved, and the fevers have resolved; but now she's developed chest soreness secondary to cough. Patient describes her pain as mild intermittent sore nonradiating anterior chest pain that only occurs with coughing, and with no tx for the pain tried. Also reports some mild diarrhea from the augmentin. Patient denies any recent prolonged travel/surgery/immobilization, estrogen use, personal/family history of DVT/PE. Patient denies any ongoing fever, chills, diaphoresis, lightheadedness, ear pain, ear drainage, sore throat, hemoptysis, SOB, LE swelling, abdominal pain, N/V, constipation, hematochezia, melena, arthralgias, myalgias, numbness, tingling, focal weakness, or any additional symptoms. Nonsmoker. No hx of asthma/COPD. +Sick contacts.   The history is provided by the patient and medical records. No language interpreter was used.  Cough  This is a new problem. The current episode started more than 1 week ago. The problem occurs constantly. The problem has not changed since onset.The cough is productive of sputum. There  has been no fever. Associated symptoms include chest pain (chest soreness with cough), rhinorrhea and wheezing. Pertinent negatives include no chills, no sweats, no ear congestion, no ear pain, no sore throat, no myalgias and no shortness of breath. She has tried decongestants (augmentin and decongestant) for the symptoms. The treatment provided mild relief. She is not a smoker. Her past medical history does not include COPD or asthma.    Past Medical History:  Diagnosis Date  . Anxiety   . Diabetes mellitus   . Diabetes type 1, uncontrolled (HCC) 11/14/2011   Since age 88  . Fibromyalgia   . Gastroparesis   . Hypertension   . Infection    UTI April 2016    Patient Active Problem List   Diagnosis Date Noted  . Forceps or vacuum extractor delivery 01/26/2015  . Pregnant 01/24/2015  . Hypertension in pregnancy, antepartum 01/16/2015  . Pre-existing essential hypertension complicating pregnancy in second trimester   . Abnormal first trimester screen   . Encounter for fetal anatomic survey   . [redacted] weeks gestation of pregnancy   . Encounter for (NT) nuchal translucency scan   . First trimester screening   . [redacted] weeks gestation of pregnancy   . Pre-existing type 1 diabetes mellitus during pregnancy in first trimester   . Hypertension in pregnancy   . DKA (diabetic ketoacidoses) (HCC) 06/11/2013  . DKA, type 1 (HCC) 06/11/2013  . Orthostatic hypotension dysautonomic syndrome (HCC) 04/28/2013  . Body aches 04/28/2013  . Orthostatic hypotension 04/28/2013  . Diabetes mellitus type 1 (HCC) 04/28/2013  . Protein-calorie malnutrition, severe (HCC) 04/28/2013  . Noncompliance with diabetes treatment 09/01/2012  . Epigastric abdominal pain 03/15/2012  . Dyspnea 02/28/2012  . Sinus tachycardia 02/28/2012  .  Hyperglycemia without ketosis 02/28/2012  . Chest pain 12/04/2011  . Diabetes type 1, uncontrolled (HCC) 11/14/2011  . Hypertension associated with diabetes (HCC) 11/14/2011  .  Gastroparesis 11/14/2011  . Hyponatremia 09/19/2011  . Abdominal pain 09/18/2011  . Metabolic alkalosis 09/18/2011  . Diabetic hyperosmolar non-ketotic state (HCC) 09/18/2011  . DIAB W/UNSPEC COMP TYPE I [JUV TYPE] UNCNTRL 06/04/2010  . CONSTIPATION 01/01/2010  . RECTAL PAIN 01/01/2010    Past Surgical History:  Procedure Laterality Date  . ANKLE SURGERY    . CHOLECYSTECTOMY  11/15/2011   Procedure: LAPAROSCOPIC CHOLECYSTECTOMY WITH INTRAOPERATIVE CHOLANGIOGRAM;  Surgeon: [name], MD;  Location: [facility];  Service: General;  Laterality: N/A;  . ESOPHAGOGASTRODUODENOSCOPY  12/03/2011   Procedure: ESOPHAGOGASTRODUODENOSCOPY (EGD);  Surgeon: Theda Belfast, MD;  Location: [facility];  Service: Endoscopy;  Laterality: N/A;  . LAPAROSCOPY  11/23/2011   Procedure: LAPAROSCOPY DIAGNOSTIC;  Surgeon: Mariella Saa, MD;  Location: [facility];  Service: General;  Laterality: N/A;    OB History    Gravida Para Term Preterm AB Living   1 1   1   1    SAB TAB Ectopic Multiple Live Births         0 1       Home Medications    Prior to Admission medications   Medication Sig Start Date End Date Taking? Authorizing Provider  albuterol (PROVENTIL HFA;VENTOLIN HFA) 108 (90 Base) MCG/ACT inhaler Inhale 2 puffs into the lungs every 6 (six) hours as needed for wheezing or shortness of breath.    [provider]  bismuth subsalicylate (PEPTO BISMOL) 262 MG chewable tablet Chew 524 mg by mouth daily as needed for diarrhea or loose stools.    [provider]  calcium carbonate (TUMS - DOSED IN MG ELEMENTAL CALCIUM) 500 MG chewable tablet Chew 2 tablets by mouth daily as needed for indigestion or heartburn.    [provider]  cephALEXin (KEFLEX) 500 MG capsule Take 1 capsule (500 mg total) by mouth 2 (two) times daily. Patient not taking: Reported on 09/14/2015 05/09/15   Horton, Mayer Masker, MD  ciprofloxacin (CIPRO) 500 MG tablet Take 1 tablet (500 mg total) by mouth 2  (two) times daily. Patient not taking: Reported on 09/14/2015 05/24/15   Roxy Horseman, PA-C  EPINEPHrine (EPIPEN 2-PAK) 0.3 mg/0.3 mL IJ SOAJ injection Inject 0.3 mLs (0.3 mg total) into the muscle once. 08/10/14   Denney, Rachelle A, CNM  glucose 4 GM chewable tablet Chew 1 tablet (4 g total) by mouth as needed for low blood sugar. 08/10/14   Denney, Rachelle A, CNM  guaiFENesin-codeine 100-10 MG/5ML syrup Take 10 mLs by mouth every 8 (eight) hours as needed for cough. Patient not taking: Reported on 05/13/2016 04/29/15   Tilden Fossa, MD  hydrochlorothiazide (HYDRODIURIL) 25 MG tablet Take 1 tablet (25 mg total) by mouth daily. Patient not taking: Reported on 01/15/2016 01/29/15   Brock Bad, MD  ibuprofen (ADVIL,MOTRIN) 600 MG tablet Take 1 tablet (600 mg total) by mouth every 6 (six) hours as needed for mild pain. Patient not taking: Reported on 04/30/2015 01/29/15   Brock Bad, MD  Ibuprofen (MIDOL) 200 MG CAPS Take 400 mg by mouth daily as needed (pain).    [provider]  insulin glargine (LANTUS) 100 UNIT/ML injection Inject 16 Units into the skin 2 (two) times daily. Sometimes has to adjust based on BG levels    [provider]  insulin lispro (HUMALOG) 100 UNIT/ML injection  Inject 8 Units into the skin 3 (three) times daily as needed for high blood sugar. Patient uses sliding scale.  Always uses 8 units but could be more.    [provider]  ketotifen (ZADITOR) 0.025 % ophthalmic solution Place 1 drop into both eyes 2 (two) times daily as needed (dry eyes).    [provider]  labetalol (NORMODYNE) 300 MG tablet Take 1 tablet (300 mg total) by mouth 3 (three) times daily. Patient not taking: Reported on 01/15/2016 01/29/15   Brock Bad, MD  Loperamide HCl (IMODIUM A-D) 1 MG/7.5ML LIQD Take 1 mg by mouth daily as needed (diarrhea).    [provider]  metoCLOPramide (REGLAN) 10 MG tablet Take 1 tablet (10 mg total) by mouth  every 6 (six) hours as needed for nausea or vomiting. 05/13/16   Ward, Chase Picket, PA-C  Norethindrone Acetate-Ethinyl Estrad-FE (LOESTRIN 24 FE) 1-20 MG-MCG(24) tablet Take 1 tablet by mouth daily. Patient not taking: Reported on 09/14/2015 03/29/15   Brock Bad, MD  ondansetron (ZOFRAN) 4 MG tablet Take 1 tablet (4 mg total) by mouth every 8 (eight) hours as needed for nausea or vomiting. Patient not taking: Reported on 05/13/2016 04/30/15   Fayrene Helper, PA-C  oxyCODONE-acetaminophen (PERCOCET/ROXICET) 5-325 MG tablet Take 2 tablets by mouth every 4 (four) hours as needed for severe pain. Patient not taking: Reported on 01/15/2016 09/14/15   Cheri Fowler, PA-C  phenazopyridine (PYRIDIUM) 200 MG tablet Take 1 tablet (200 mg total) by mouth 3 (three) times daily as needed for pain. Patient not taking: Reported on 09/14/2015 05/09/15   Horton, Mayer Masker, MD  potassium chloride SA (K-DUR,KLOR-CON) 20 MEQ tablet Take 1 tablet (20 mEq total) by mouth daily. Patient not taking: Reported on 09/14/2015 04/30/15   Fayrene Helper, PA-C  prednisoLONE acetate (PRED FORTE) 1 % ophthalmic suspension Place 1 drop into both eyes 4 (four) times daily. 05/01/16   [provider]  Prenat w/o A-FeCbn-Meth-FA-DHA (OB COMPLETE GOLD) 27.5-1-200 MG CAPS Take 1 tablet by mouth daily. Patient not taking: Reported on 09/14/2015 09/15/14   Roe Coombs, CNM  promethazine (PHENERGAN) 25 MG tablet Take 1 tablet (25 mg total) by mouth every 8 (eight) hours as needed for nausea or vomiting. Patient not taking: Reported on 05/13/2016 01/15/16   Charlestine Night, PA-C  Propylene Glycol 0.6 % SOLN Apply 1 drop to eye 2 (two) times daily.    [provider]  ranitidine (ZANTAC) 150 MG capsule Take 1 capsule (150 mg total) by mouth 2 (two) times daily. Patient not taking: Reported on 09/14/2015 01/02/15   Brock Bad, MD  traMADol (ULTRAM) 50 MG tablet Take 1 tablet (50 mg total) by mouth every 6 (six) hours  as needed for severe pain. Patient not taking: Reported on 05/13/2016 01/15/16   Charlestine Night, PA-C    Family History Family History  Problem Relation Age of Onset  . Diabetes Mother   . Hypertension Father     Social History Social History  Substance Use Topics  . Smoking status: Never Smoker  . Smokeless tobacco: Never Used  . Alcohol use 0.0 oz/week     Allergies   Peanut-containing drug products and Lactose intolerance (gi)   Review of Systems Review of Systems  Constitutional: Negative for chills, diaphoresis and fever.  HENT: Positive for congestion and rhinorrhea. Negative for ear discharge, ear pain and sore throat.   Respiratory: Positive for cough and wheezing. Negative for shortness of breath.  Cardiovascular: Positive for chest pain (chest soreness with cough). Negative for leg swelling.  Gastrointestinal: Positive for diarrhea. Negative for abdominal pain, constipation, nausea and vomiting.  Genitourinary: Negative for dysuria and hematuria.  Musculoskeletal: Negative for arthralgias and myalgias.  Skin: Negative for rash.  Allergic/Immunologic: Positive for immunocompromised state (DM1).  Neurological: Negative for weakness, light-headedness and numbness.  Psychiatric/Behavioral: Negative for confusion.    10 Systems reviewed and all are negative for acute change except as noted in the HPI.   Physical Exam Updated Vital Signs BP 102/71 (BP Location: Left Arm)   Pulse (!) 102   Temp 98.5 F (36.9 C) (Oral)   Resp 20   Ht 5\' 7"  (1.702 m)   Wt 130 lb (59 kg)   LMP 08/31/2016 (Exact Date)   SpO2 98%   BMI 20.36 kg/m   Physical Exam  Constitutional: She is oriented to person, place, and time. Vital signs are normal. She appears well-developed and well-nourished.  Non-toxic appearance. No distress.  Afebrile, nontoxic, NAD  HENT:  Head: Normocephalic and atraumatic.  Nose: Mucosal edema and rhinorrhea present.  Mouth/Throat: Uvula is  midline, oropharynx is clear and moist and mucous membranes are normal. No trismus in the jaw. No uvula swelling. Tonsils are 0 on the right. Tonsils are 0 on the left. No tonsillar exudate.  Nose congested. Oropharynx clear and moist, without uvular swelling or deviation, no trismus or drooling, no tonsillar swelling or erythema, no exudates.   Eyes: Conjunctivae and EOM are normal. Right eye exhibits no discharge. Left eye exhibits no discharge.  Neck: Normal range of motion. Neck supple.  Cardiovascular: Normal rate, regular rhythm, normal heart sounds and intact distal pulses.  Exam reveals no gallop and no friction rub.   No murmur heard. Tachycardic in triage, however resolved on exam. RRR, nl s1/s2, no m/r/g, distal pulses intact, no pedal edema  Pulmonary/Chest: Effort normal and breath sounds normal. No respiratory distress. She has no decreased breath sounds. She has no wheezes. She has no rhonchi. She has no rales. She exhibits tenderness. She exhibits no crepitus, no deformity and no retraction.  CTAB in all lung fields, no w/r/r, no hypoxia or increased WOB, speaking in full sentences, SpO2 98% on RA Chest wall with mild anterior TTP, without crepitus, deformities, or retractions  Abdominal: Soft. Normal appearance and bowel sounds are normal. She exhibits no distension. There is no tenderness. There is no rigidity, no rebound, no guarding, no CVA tenderness, no tenderness at McBurney's point and negative Murphy's sign.  Musculoskeletal: Normal range of motion.  Neurological: She is alert and oriented to person, place, and time. She has normal strength. No sensory deficit.  Skin: Skin is warm, dry and intact. No rash noted.  Psychiatric: She has a normal mood and affect.  Nursing note and vitals reviewed.    ED Treatments / Results  DIAGNOSTIC STUDIES: Oxygen Saturation is 98% on RA, normal by my interpretation.    COORDINATION OF CARE: 5:30 PM Discussed treatment plan with pt at  bedside and pt agreed to plan, which includes a chest x-ray and breathing treatment.   Labs (all labs ordered are listed, but only abnormal results are displayed) Labs Reviewed - No data to display  EKG  EKG Interpretation None       Radiology Dg Chest 2 View  Result Date: 09/02/2016 CLINICAL DATA:  Chest pain and soreness. Cough and fever. Symptoms for 2 days. EXAM: CHEST  2 VIEW COMPARISON:  04/29/2015 FINDINGS: Normal heart  size. Lungs clear. No pneumothorax. No pleural effusion. IMPRESSION: No active cardiopulmonary disease. Electronically Signed   By: Jolaine Click M.D.   On: 09/02/2016 19:02    Procedures Procedures (including critical care time)  Medications Ordered in ED Medications  albuterol (PROVENTIL) (2.5 MG/3ML) 0.083% nebulizer solution 5 mg (5 mg Nebulization Given 09/02/16 1747)  ipratropium (ATROVENT) nebulizer solution 0.5 mg (0.5 mg Nebulization Given 09/02/16 1747)  ibuprofen (ADVIL,MOTRIN) tablet 800 mg (800 mg Oral Given 09/02/16 1746)     Initial Impression / Assessment and Plan / ED Course  I have reviewed the triage vital signs and the nursing notes.  Pertinent labs & imaging results that were available during my care of the patient were reviewed by me and considered in my medical decision making (see chart for details).     26 y.o. female here with ongoing cough x11 days, and improve congestion, with chest soreness from coughing. Seen by her PCP 8 days ago, given decongestant and augmentin; states most of her symptoms are improving but she's had more chest soreness from coughing, and the cough is still present. Denies SOB. On exam mild anterior chest tenderness, no LE swelling, no tachycardia on exam (triage HR 102, but resolved on exam), no hypoxia, no RFs for PE, doubt this as a cause. Lungs clear. Nose congested, throat clear. Likely viral URI, however will obtain CXR to ensure no PNA. Will give duoneb as this may help make her feel less congested; will give  ibuprofen as well. Will reassess shortly  7:23 PM CXR negative for PNA or other cardiopulmonary finding. Pt feeling much better after duoneb, lung sounds still clear. Chest pain likely from costochondritis from coughing; advised continuation of her home meds including the abx until finished, although I feel this is likely viral illness and postviral cough. Advised use of heat to the sore area, tylenol/motrin and OTC remedies for symptomatic relief. Will rx inhaler to help with congestion/cough. F/up with PCP in 1wk for recheck. I explained the diagnosis and have given explicit precautions to return to the ER including for any other new or worsening symptoms. The patient understands and accepts the medical plan as it's been dictated and I have answered their questions. Discharge instructions concerning home care and prescriptions have been given. The patient is STABLE and is discharged to home in good condition.   I personally performed the services described in this documentation, which was scribed in my presence. The recorded information has been reviewed and is accurate.    Final Clinical Impressions(s) / ED Diagnoses   Final diagnoses:  Cough  Costochondritis  Chest wall pain  Upper respiratory tract infection, unspecified type  Post-viral cough syndrome    New Prescriptions New Prescriptions   ALBUTEROL (PROVENTIL HFA;VENTOLIN HFA) 108 (90 BASE) MCG/ACT INHALER    Inhale 1-2 puffs into the lungs every 4 (four) hours as needed for wheezing or shortness of breath.     [address] [place], [place] 09/02/16 1926    Vanetta Mulders, MD 09/05/16 [phone]

## 2016-09-02 NOTE — Discharge Instructions (Signed)
Continue to stay well-hydrated. Use heat to your chest to help with soreness. Gargle warm salt water and spit it out. Use chloraseptic spray as needed for sore throat. Continue to alternate between Tylenol and Ibuprofen for pain or fever. Use Mucinex for cough suppression/expectoration of mucus. Use netipot and flonase to help with nasal congestion. May consider over-the-counter Benadryl or other antihistamine to decrease secretions and for help with your symptoms. Use inhaler as directed, as needed for cough/chest congestion/wheezing/shortness of breath. Continue using the medications given to you by your regular doctor, including the antibiotic. Follow up with your primary care doctor in 5-7 days for recheck of ongoing symptoms. Return to emergency department for emergent changing or worsening of symptoms.

## 2016-09-09 DIAGNOSIS — R194 Change in bowel habit: Secondary | ICD-10-CM | POA: Diagnosis not present

## 2016-09-09 DIAGNOSIS — K3184 Gastroparesis: Secondary | ICD-10-CM | POA: Diagnosis not present

## 2016-12-14 ENCOUNTER — Emergency Department (HOSPITAL_COMMUNITY)
Admission: EM | Admit: 2016-12-14 | Discharge: 2016-12-14 | Disposition: A | Discharge status: Home / Self Care | Payer: 59 | Attending: Emergency Medicine | Admitting: Emergency Medicine

## 2016-12-14 ENCOUNTER — Encounter (HOSPITAL_COMMUNITY): Payer: Self-pay | Admitting: Emergency Medicine

## 2016-12-14 ENCOUNTER — Emergency Department (HOSPITAL_COMMUNITY): Payer: 59

## 2016-12-14 DIAGNOSIS — R079 Chest pain, unspecified: Secondary | ICD-10-CM | POA: Diagnosis not present

## 2016-12-14 DIAGNOSIS — R1110 Vomiting, unspecified: Secondary | ICD-10-CM | POA: Insufficient documentation

## 2016-12-14 DIAGNOSIS — R109 Unspecified abdominal pain: Secondary | ICD-10-CM | POA: Diagnosis not present

## 2016-12-14 DIAGNOSIS — Z5321 Procedure and treatment not carried out due to patient leaving prior to being seen by health care provider: Secondary | ICD-10-CM | POA: Diagnosis not present

## 2016-12-14 LAB — BASIC METABOLIC PANEL
Anion gap: 17 — ABNORMAL HIGH (ref 5–15)
BUN: 18 mg/dL (ref 6–20)
CO2: 15 mmol/L — ABNORMAL LOW (ref 22–32)
Calcium: 9 mg/dL (ref 8.9–10.3)
Chloride: 100 mmol/L — ABNORMAL LOW (ref 101–111)
Creatinine, Ser: 0.85 mg/dL (ref 0.44–1.00)
GFR calc Af Amer: >60 mL/min (ref >60)
GFR calc non Af Amer: >60 mL/min (ref >60)
Glucose, Bld: 562 mg/dL (ref 65–99)
Potassium: 3.4 mmol/L — ABNORMAL LOW (ref 3.5–5.1)
Sodium: 132 mmol/L — ABNORMAL LOW (ref 135–145)

## 2016-12-14 LAB — CBC
HCT: 34.4 % — ABNORMAL LOW (ref 36.0–46.0)
Hemoglobin: 12 g/dL (ref 12.0–15.0)
MCH: 31.2 pg (ref 26.0–34.0)
MCHC: 34.9 g/dL (ref 30.0–36.0)
MCV: 89.4 fL (ref 78.0–100.0)
Platelets: 197 10*3/uL (ref 150–400)
RBC: 3.85 MIL/uL — ABNORMAL LOW (ref 3.87–5.11)
RDW: 13.3 % (ref 11.5–15.5)
WBC: 4.4 10*3/uL (ref 4.0–10.5)

## 2016-12-14 LAB — POCT I-STAT TROPONIN I: Troponin i, poc: 0 ng/mL (ref 0.00–0.08)

## 2016-12-14 IMAGING — CR DG CHEST 2V
2 series · 2 of 2 positions shown · non-contrast
Comparison: [DATE] and prior exams

CLINICAL DATA: Acute chest pain for 1 day.

EXAM:
CHEST  2 VIEW

[w chest lat]
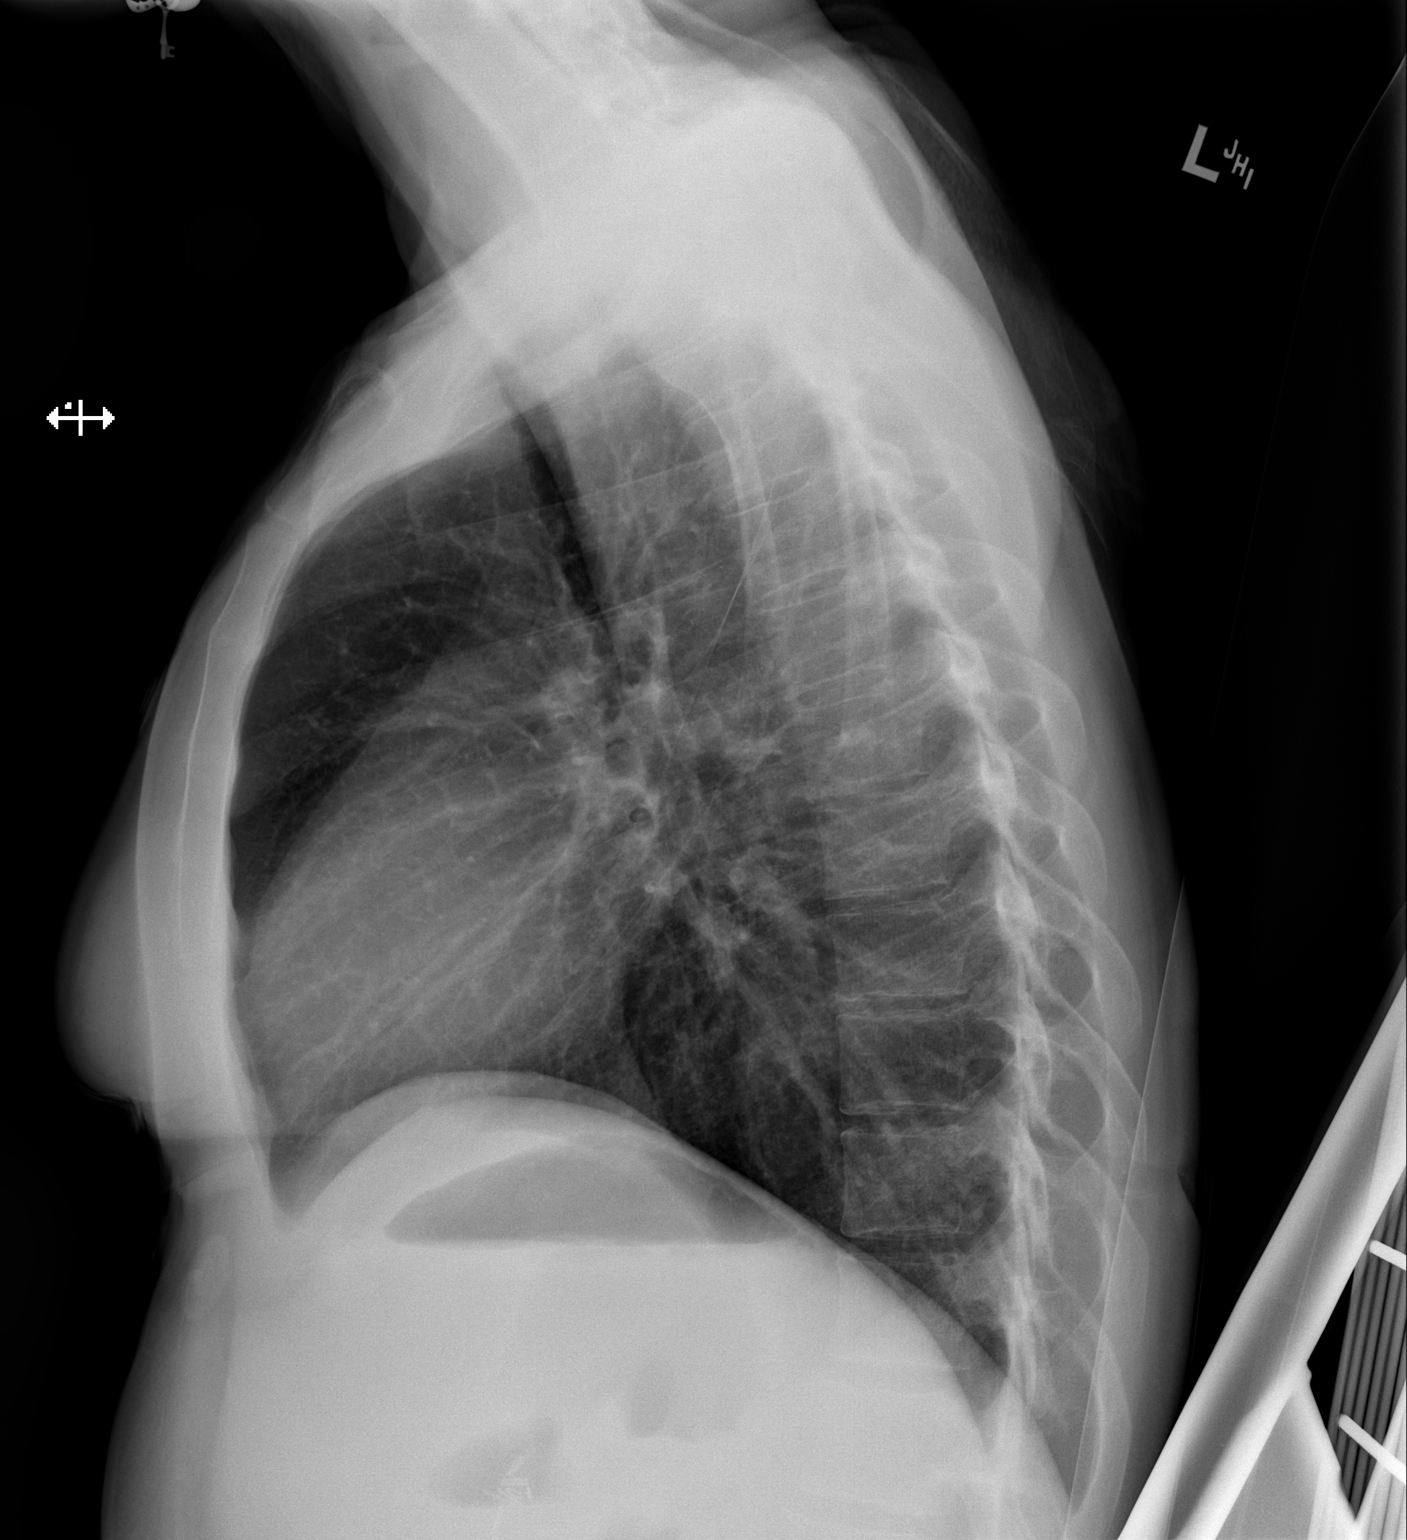

[x chest ap]
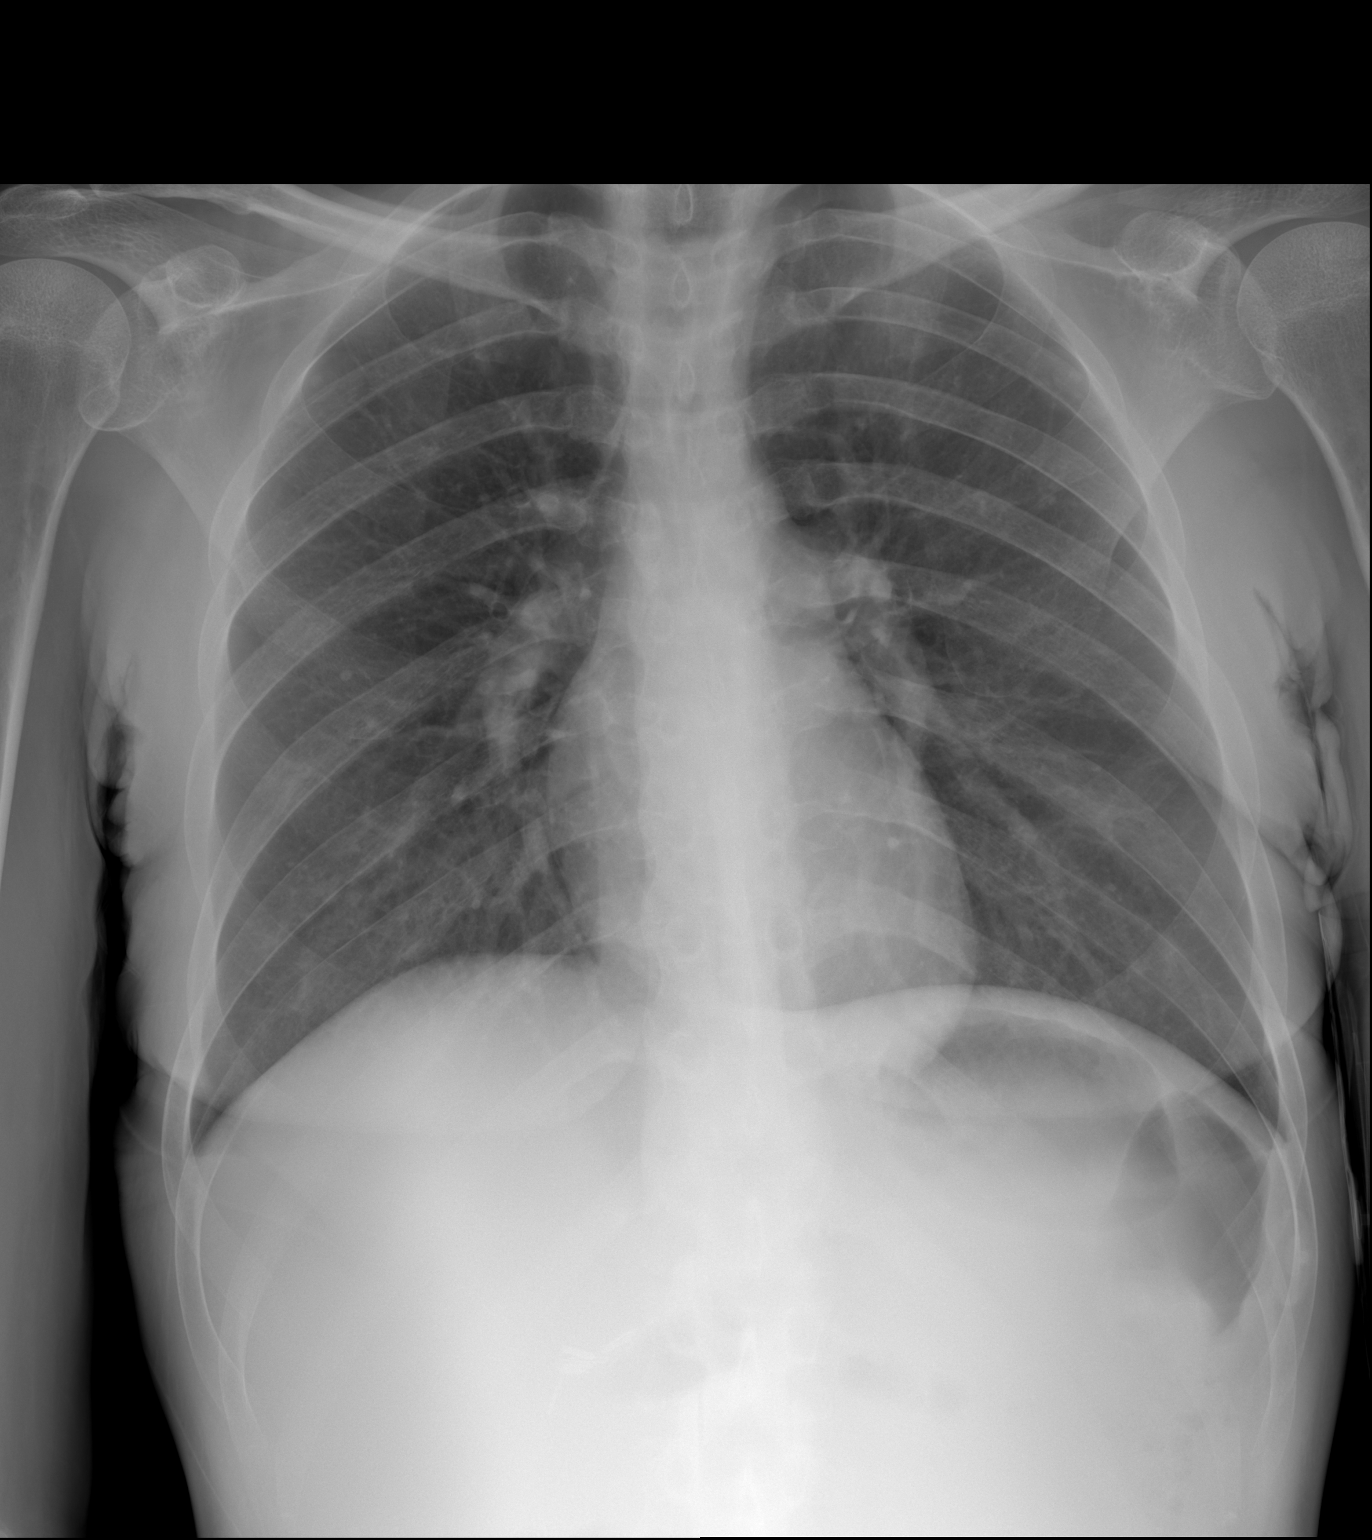

[2 of 2 positions shown; findings below may reference images not displayed]

FINDINGS: The cardiomediastinal silhouette is unremarkable.

There is no evidence of focal airspace disease, pulmonary edema,
suspicious pulmonary nodule/mass, pleural effusion, or pneumothorax.
No acute bony abnormalities are identified.
IMPRESSION: No active cardiopulmonary disease.

## 2016-12-14 NOTE — ED Notes (Signed)
Date and time results received: 12/14/16 7:31 PM  (use smartphrase ".now" to insert current time)  Test: Glucose Critical Value: 562  Name of Provider Notified: Dr.Issacs Orders Received? Or Actions Taken?:

## 2016-12-14 NOTE — ED Notes (Signed)
Pt advised that she was leaving because she needs to work tomorrow. Pt advised to return to department if symptoms should persist or worsen.

## 2016-12-14 NOTE — ED Triage Notes (Signed)
Patient c/o central aching chest pain and upper abdominal pain with N/V/D since yesterday.

## 2017-01-23 DIAGNOSIS — E1065 Type 1 diabetes mellitus with hyperglycemia: Secondary | ICD-10-CM | POA: Diagnosis not present

## 2017-02-26 ENCOUNTER — Encounter (HOSPITAL_COMMUNITY): Payer: Self-pay | Admitting: Emergency Medicine

## 2017-02-26 ENCOUNTER — Inpatient Hospital Stay (HOSPITAL_COMMUNITY)
Admission: EM | Admit: 2017-02-26 | Discharge: 2017-03-14 | DRG: 622 | Disposition: A | Discharge status: Home / Self Care | Payer: 59 | Attending: Internal Medicine | Admitting: Internal Medicine

## 2017-02-26 ENCOUNTER — Emergency Department (HOSPITAL_COMMUNITY): Payer: 59

## 2017-02-26 DIAGNOSIS — K3184 Gastroparesis: Secondary | ICD-10-CM | POA: Diagnosis present

## 2017-02-26 DIAGNOSIS — E739 Lactose intolerance, unspecified: Secondary | ICD-10-CM | POA: Diagnosis present

## 2017-02-26 DIAGNOSIS — E1010 Type 1 diabetes mellitus with ketoacidosis without coma: Principal | ICD-10-CM | POA: Diagnosis present

## 2017-02-26 DIAGNOSIS — I340 Nonrheumatic mitral (valve) insufficiency: Secondary | ICD-10-CM | POA: Diagnosis not present

## 2017-02-26 DIAGNOSIS — IMO0001 Reserved for inherently not codable concepts without codable children: Secondary | ICD-10-CM | POA: Diagnosis present

## 2017-02-26 DIAGNOSIS — E43 Unspecified severe protein-calorie malnutrition: Secondary | ICD-10-CM | POA: Diagnosis present

## 2017-02-26 DIAGNOSIS — D649 Anemia, unspecified: Secondary | ICD-10-CM | POA: Diagnosis not present

## 2017-02-26 DIAGNOSIS — E1011 Type 1 diabetes mellitus with ketoacidosis with coma: Secondary | ICD-10-CM | POA: Diagnosis not present

## 2017-02-26 DIAGNOSIS — Z833 Family history of diabetes mellitus: Secondary | ICD-10-CM

## 2017-02-26 DIAGNOSIS — D509 Iron deficiency anemia, unspecified: Secondary | ICD-10-CM | POA: Diagnosis not present

## 2017-02-26 DIAGNOSIS — Z9114 Patient's other noncompliance with medication regimen: Secondary | ICD-10-CM

## 2017-02-26 DIAGNOSIS — K529 Noninfective gastroenteritis and colitis, unspecified: Secondary | ICD-10-CM | POA: Diagnosis present

## 2017-02-26 DIAGNOSIS — R197 Diarrhea, unspecified: Secondary | ICD-10-CM | POA: Diagnosis present

## 2017-02-26 DIAGNOSIS — R4182 Altered mental status, unspecified: Secondary | ICD-10-CM | POA: Diagnosis not present

## 2017-02-26 DIAGNOSIS — E10649 Type 1 diabetes mellitus with hypoglycemia without coma: Secondary | ICD-10-CM | POA: Diagnosis not present

## 2017-02-26 DIAGNOSIS — G9341 Metabolic encephalopathy: Secondary | ICD-10-CM | POA: Diagnosis not present

## 2017-02-26 DIAGNOSIS — E1142 Type 2 diabetes mellitus with diabetic polyneuropathy: Secondary | ICD-10-CM | POA: Diagnosis present

## 2017-02-26 DIAGNOSIS — K8689 Other specified diseases of pancreas: Secondary | ICD-10-CM | POA: Diagnosis present

## 2017-02-26 DIAGNOSIS — K611 Rectal abscess: Secondary | ICD-10-CM | POA: Diagnosis not present

## 2017-02-26 DIAGNOSIS — Z9049 Acquired absence of other specified parts of digestive tract: Secondary | ICD-10-CM | POA: Diagnosis not present

## 2017-02-26 DIAGNOSIS — IMO0002 Reserved for concepts with insufficient information to code with codable children: Secondary | ICD-10-CM | POA: Diagnosis present

## 2017-02-26 DIAGNOSIS — R188 Other ascites: Secondary | ICD-10-CM | POA: Diagnosis not present

## 2017-02-26 DIAGNOSIS — E1165 Type 2 diabetes mellitus with hyperglycemia: Secondary | ICD-10-CM | POA: Diagnosis present

## 2017-02-26 DIAGNOSIS — J90 Pleural effusion, not elsewhere classified: Secondary | ICD-10-CM | POA: Diagnosis not present

## 2017-02-26 DIAGNOSIS — E876 Hypokalemia: Secondary | ICD-10-CM | POA: Diagnosis not present

## 2017-02-26 DIAGNOSIS — Z9119 Patient's noncompliance with other medical treatment and regimen: Secondary | ICD-10-CM | POA: Diagnosis not present

## 2017-02-26 DIAGNOSIS — Z681 Body mass index (BMI) 19 or less, adult: Secondary | ICD-10-CM | POA: Diagnosis not present

## 2017-02-26 DIAGNOSIS — I10 Essential (primary) hypertension: Secondary | ICD-10-CM | POA: Diagnosis present

## 2017-02-26 DIAGNOSIS — D638 Anemia in other chronic diseases classified elsewhere: Secondary | ICD-10-CM | POA: Diagnosis present

## 2017-02-26 DIAGNOSIS — E0810 Diabetes mellitus due to underlying condition with ketoacidosis without coma: Secondary | ICD-10-CM | POA: Diagnosis not present

## 2017-02-26 DIAGNOSIS — Z91010 Allergy to peanuts: Secondary | ICD-10-CM

## 2017-02-26 DIAGNOSIS — R1110 Vomiting, unspecified: Secondary | ICD-10-CM | POA: Diagnosis not present

## 2017-02-26 DIAGNOSIS — K612 Anorectal abscess: Secondary | ICD-10-CM | POA: Diagnosis present

## 2017-02-26 DIAGNOSIS — M7989 Other specified soft tissue disorders: Secondary | ICD-10-CM | POA: Diagnosis not present

## 2017-02-26 DIAGNOSIS — F410 Panic disorder [episodic paroxysmal anxiety] without agoraphobia: Secondary | ICD-10-CM | POA: Diagnosis present

## 2017-02-26 DIAGNOSIS — E1110 Type 2 diabetes mellitus with ketoacidosis without coma: Secondary | ICD-10-CM | POA: Diagnosis present

## 2017-02-26 DIAGNOSIS — M797 Fibromyalgia: Secondary | ICD-10-CM | POA: Diagnosis not present

## 2017-02-26 DIAGNOSIS — R531 Weakness: Secondary | ICD-10-CM | POA: Diagnosis not present

## 2017-02-26 DIAGNOSIS — E119 Type 2 diabetes mellitus without complications: Secondary | ICD-10-CM

## 2017-02-26 DIAGNOSIS — E1043 Type 1 diabetes mellitus with diabetic autonomic (poly)neuropathy: Secondary | ICD-10-CM | POA: Diagnosis present

## 2017-02-26 DIAGNOSIS — K50918 Crohn's disease, unspecified, with other complication: Secondary | ICD-10-CM | POA: Diagnosis not present

## 2017-02-26 DIAGNOSIS — R1084 Generalized abdominal pain: Secondary | ICD-10-CM | POA: Diagnosis not present

## 2017-02-26 DIAGNOSIS — E440 Moderate protein-calorie malnutrition: Secondary | ICD-10-CM | POA: Diagnosis not present

## 2017-02-26 DIAGNOSIS — Z8249 Family history of ischemic heart disease and other diseases of the circulatory system: Secondary | ICD-10-CM

## 2017-02-26 DIAGNOSIS — F418 Other specified anxiety disorders: Secondary | ICD-10-CM | POA: Diagnosis present

## 2017-02-26 DIAGNOSIS — E1065 Type 1 diabetes mellitus with hyperglycemia: Secondary | ICD-10-CM

## 2017-02-26 DIAGNOSIS — Z794 Long term (current) use of insulin: Secondary | ICD-10-CM

## 2017-02-26 DIAGNOSIS — K922 Gastrointestinal hemorrhage, unspecified: Secondary | ICD-10-CM | POA: Diagnosis present

## 2017-02-26 DIAGNOSIS — R609 Edema, unspecified: Secondary | ICD-10-CM | POA: Diagnosis not present

## 2017-02-26 DIAGNOSIS — F330 Major depressive disorder, recurrent, mild: Secondary | ICD-10-CM

## 2017-02-26 DIAGNOSIS — R601 Generalized edema: Secondary | ICD-10-CM | POA: Diagnosis not present

## 2017-02-26 DIAGNOSIS — R0602 Shortness of breath: Secondary | ICD-10-CM | POA: Diagnosis not present

## 2017-02-26 LAB — I-STAT BETA HCG BLOOD, ED (MC, WL, AP ONLY): I-stat hCG, quantitative: <5 m[IU]/mL (ref <5)

## 2017-02-26 LAB — CBC WITH DIFFERENTIAL/PLATELET
Basophils Absolute: 0 10*3/uL (ref 0.0–0.1)
Basophils Relative: 0 %
Eosinophils Absolute: 0 10*3/uL (ref 0.0–0.7)
Eosinophils Relative: 0 %
HCT: 29.5 % — ABNORMAL LOW (ref 36.0–46.0)
Hemoglobin: 9.2 g/dL — ABNORMAL LOW (ref 12.0–15.0)
Lymphocytes Relative: 7 %
Lymphs Abs: 1.1 10*3/uL (ref 0.7–4.0)
MCH: 31 pg (ref 26.0–34.0)
MCHC: 31.2 g/dL (ref 30.0–36.0)
MCV: 99.3 fL (ref 78.0–100.0)
Monocytes Absolute: 2.1 10*3/uL — ABNORMAL HIGH (ref 0.1–1.0)
Monocytes Relative: 14 %
Neutro Abs: 12 10*3/uL — ABNORMAL HIGH (ref 1.7–7.7)
Neutrophils Relative %: 79 %
Platelets: 298 10*3/uL (ref 150–400)
RBC: 2.97 MIL/uL — ABNORMAL LOW (ref 3.87–5.11)
RDW: 14.5 % (ref 11.5–15.5)
WBC: 15.2 10*3/uL — ABNORMAL HIGH (ref 4.0–10.5)

## 2017-02-26 LAB — COMPREHENSIVE METABOLIC PANEL
ALT: 11 U/L — ABNORMAL LOW (ref 14–54)
AST: 13 U/L — ABNORMAL LOW (ref 15–41)
Albumin: 2.2 g/dL — ABNORMAL LOW (ref 3.5–5.0)
Alkaline Phosphatase: 181 U/L — ABNORMAL HIGH (ref 38–126)
BUN: 7 mg/dL (ref 6–20)
CO2: <7 mmol/L — ABNORMAL LOW (ref 22–32)
Calcium: 8.7 mg/dL — ABNORMAL LOW (ref 8.9–10.3)
Chloride: 110 mmol/L (ref 101–111)
Creatinine, Ser: 0.86 mg/dL (ref 0.44–1.00)
GFR calc Af Amer: >60 mL/min (ref >60)
GFR calc non Af Amer: >60 mL/min (ref >60)
Glucose, Bld: 430 mg/dL — ABNORMAL HIGH (ref 65–99)
Potassium: 4 mmol/L (ref 3.5–5.1)
Sodium: 138 mmol/L (ref 135–145)
Total Bilirubin: 2.1 mg/dL — ABNORMAL HIGH (ref 0.3–1.2)
Total Protein: 7.8 g/dL (ref 6.5–8.1)

## 2017-02-26 LAB — CBG MONITORING, ED
Glucose-Capillary: 395 mg/dL — ABNORMAL HIGH (ref 65–99)
Glucose-Capillary: 397 mg/dL — ABNORMAL HIGH (ref 65–99)
Glucose-Capillary: 380 mg/dL — ABNORMAL HIGH (ref 65–99)
Glucose-Capillary: 276 mg/dL — ABNORMAL HIGH (ref 65–99)
Glucose-Capillary: 255 mg/dL — ABNORMAL HIGH (ref 65–99)

## 2017-02-26 LAB — BLOOD GAS, VENOUS
Acid-base deficit: 28.8 mmol/L — ABNORMAL HIGH (ref 0.0–2.0)
Bicarbonate: 3.6 mmol/L — ABNORMAL LOW (ref 20.0–28.0)
O2 Saturation: 89.8 %
Patient temperature: 98.6
pCO2, Ven: 20.4 mmHg — ABNORMAL LOW (ref 44.0–60.0)
pH, Ven: 6.881 — CL (ref 7.250–7.430)
pO2, Ven: 83.7 mmHg — ABNORMAL HIGH (ref 32.0–45.0)

## 2017-02-26 LAB — BASIC METABOLIC PANEL
BUN: 8 mg/dL (ref 6–20)
CO2: <7 mmol/L — ABNORMAL LOW (ref 22–32)
Calcium: 7.9 mg/dL — ABNORMAL LOW (ref 8.9–10.3)
Chloride: 116 mmol/L — ABNORMAL HIGH (ref 101–111)
Creatinine, Ser: 0.82 mg/dL (ref 0.44–1.00)
GFR calc Af Amer: >60 mL/min (ref >60)
GFR calc non Af Amer: >60 mL/min (ref >60)
Glucose, Bld: 355 mg/dL — ABNORMAL HIGH (ref 65–99)
Potassium: 3 mmol/L — ABNORMAL LOW (ref 3.5–5.1)
Sodium: 139 mmol/L (ref 135–145)

## 2017-02-26 LAB — I-STAT CHEM 8, ED
BUN: 4 mg/dL — ABNORMAL LOW (ref 6–20)
Calcium, Ion: 1.34 mmol/L (ref 1.15–1.40)
Chloride: 114 mmol/L — ABNORMAL HIGH (ref 101–111)
Creatinine, Ser: <0.2 mg/dL — ABNORMAL LOW (ref 0.44–1.00)
Glucose, Bld: 461 mg/dL — ABNORMAL HIGH (ref 65–99)
HCT: 29 % — ABNORMAL LOW (ref 36.0–46.0)
Hemoglobin: 9.9 g/dL — ABNORMAL LOW (ref 12.0–15.0)
Potassium: 3.9 mmol/L (ref 3.5–5.1)
Sodium: 139 mmol/L (ref 135–145)
TCO2: 6 mmol/L — ABNORMAL LOW (ref 22–32)

## 2017-02-26 LAB — I-STAT CG4 LACTIC ACID, ED: Lactic Acid, Venous: 0.87 mmol/L (ref 0.5–1.9)

## 2017-02-26 LAB — GLUCOSE, CAPILLARY: Glucose-Capillary: 218 mg/dL — ABNORMAL HIGH (ref 65–99)

## 2017-02-26 LAB — CBC
HCT: 26.7 % — ABNORMAL LOW (ref 36.0–46.0)
Hemoglobin: 8.3 g/dL — ABNORMAL LOW (ref 12.0–15.0)
MCH: 31.1 pg (ref 26.0–34.0)
MCHC: 31.1 g/dL (ref 30.0–36.0)
MCV: 100 fL (ref 78.0–100.0)
Platelets: 222 10*3/uL (ref 150–400)
RBC: 2.67 MIL/uL — ABNORMAL LOW (ref 3.87–5.11)
RDW: 14.5 % (ref 11.5–15.5)
WBC: 14 10*3/uL — ABNORMAL HIGH (ref 4.0–10.5)

## 2017-02-26 LAB — PHOSPHORUS: Phosphorus: 2.9 mg/dL (ref 2.5–4.6)

## 2017-02-26 LAB — MAGNESIUM: Magnesium: 1.9 mg/dL (ref 1.7–2.4)

## 2017-02-26 IMAGING — DX DG CHEST 1V PORT
1 series · 1 of 1 positions shown · non-contrast
Comparison: [DATE]

CLINICAL DATA: 26-year-old female with weakness and fatigue.

EXAM:
PORTABLE CHEST 1 VIEW

[chest ap]
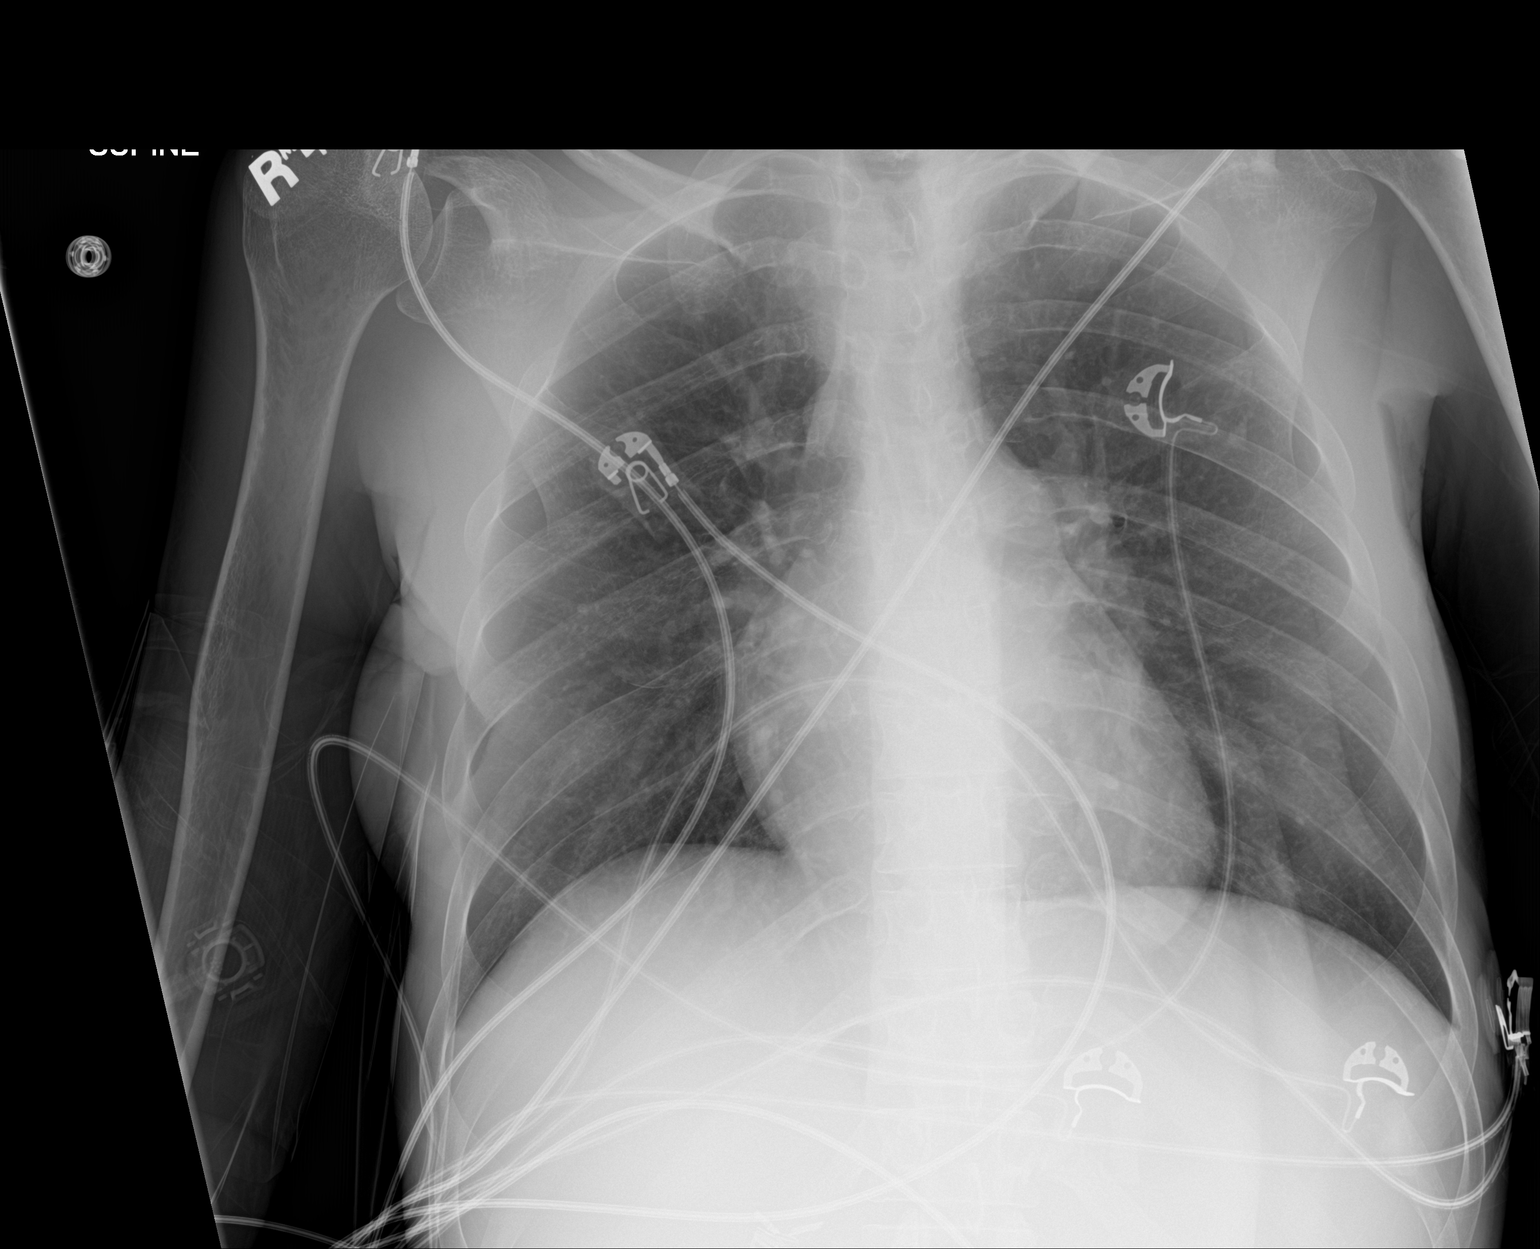

[1 of 1 positions shown; findings below may reference images not displayed]

FINDINGS: The heart size and mediastinal contours are within normal limits.

No focal parenchymal opacity or sizable pleural effusion. Small
pneumothorax difficult to definitively exclude on supine only
radiographs.

The visualized skeletal structures are unremarkable.
IMPRESSION: No active disease.

## 2017-02-26 MED ORDER — SODIUM CHLORIDE 0.9 % IV SOLN
INTRAVENOUS | Status: DC
  Administered 2017-02-26: 19:00:00 via INTRAVENOUS

## 2017-02-26 MED ORDER — DEXTROSE 50 % IV SOLN: 25.0000 mL | INTRAVENOUS | Status: DC | PRN

## 2017-02-26 MED ORDER — DEXTROSE-NACL 5-0.45 % IV SOLN
INTRAVENOUS | Status: DC
  Administered 2017-02-26: 125 mL/h via INTRAVENOUS

## 2017-02-26 MED ORDER — SODIUM CHLORIDE 0.9 % IV SOLN
INTRAVENOUS | Status: DC
  Administered 2017-02-26: 3.4 [IU]/h via INTRAVENOUS
  Filled 2017-02-26: qty 1

## 2017-02-26 MED ORDER — ENOXAPARIN SODIUM 30 MG/0.3ML ~~LOC~~ SOLN: 30.0000 mg | Freq: Every day | SUBCUTANEOUS | Status: DC

## 2017-02-26 MED ORDER — SODIUM CHLORIDE 0.9 % IV SOLN
INTRAVENOUS | Status: DC
  Filled 2017-02-26: qty 1

## 2017-02-26 MED ORDER — POTASSIUM CHLORIDE 10 MEQ/100ML IV SOLN
10.0000 meq | INTRAVENOUS | Status: AC
  Filled 2017-02-26 (×3): qty 100

## 2017-02-26 MED ORDER — DEXTROSE-NACL 5-0.45 % IV SOLN
INTRAVENOUS | Status: DC
  Administered 2017-02-27: 06:00:00 via INTRAVENOUS

## 2017-02-26 MED ORDER — ENOXAPARIN SODIUM 40 MG/0.4ML ~~LOC~~ SOLN
40.0000 mg | Freq: Every day | SUBCUTANEOUS | Status: DC
  Administered 2017-02-27: 40 mg via SUBCUTANEOUS
  Filled 2017-02-26: qty 0.4

## 2017-02-26 MED ORDER — SODIUM CHLORIDE 0.9 % IV BOLUS (SEPSIS)
1000.0000 mL | Freq: Once | INTRAVENOUS | Status: AC
  Administered 2017-02-26: 1000 mL via INTRAVENOUS

## 2017-02-26 MED ORDER — POTASSIUM CHLORIDE 10 MEQ/100ML IV SOLN: 10.0000 meq | INTRAVENOUS | Status: AC

## 2017-02-26 MED ORDER — POTASSIUM CHLORIDE 10 MEQ/100ML IV SOLN
10.0000 meq | INTRAVENOUS | Status: AC
  Administered 2017-02-26 – 2017-02-27 (×5): 10 meq via INTRAVENOUS
  Filled 2017-02-26 (×2): qty 100

## 2017-02-26 MED ORDER — SODIUM CHLORIDE 0.9 % IV BOLUS (SEPSIS)
2000.0000 mL | Freq: Once | INTRAVENOUS | Status: AC
  Administered 2017-02-26: 2000 mL via INTRAVENOUS

## 2017-02-26 MED ORDER — LACTATED RINGERS IV BOLUS (SEPSIS)
2000.0000 mL | Freq: Once | INTRAVENOUS | Status: AC
  Administered 2017-02-26: 2000 mL via INTRAVENOUS

## 2017-02-26 MED ORDER — SODIUM CHLORIDE 0.9 % IV SOLN: INTRAVENOUS | Status: DC

## 2017-02-26 MED ORDER — LIP MEDEX EX OINT
TOPICAL_OINTMENT | CUTANEOUS | Status: AC
  Administered 2017-02-26: 23:00:00
  Filled 2017-02-26: qty 7

## 2017-02-26 MED ORDER — SODIUM CHLORIDE 0.9 % IV SOLN
INTRAVENOUS | Status: AC
  Administered 2017-02-26: 999 mL/h via INTRAVENOUS
  Administered 2017-02-27: 01:00:00 via INTRAVENOUS

## 2017-02-26 MED ORDER — INSULIN REGULAR BOLUS VIA INFUSION
0.0000 [IU] | Freq: Three times a day (TID) | INTRAVENOUS | Status: DC
  Filled 2017-02-26: qty 10

## 2017-02-26 MED ORDER — SODIUM CHLORIDE 0.9 % IV SOLN
INTRAVENOUS | Status: DC
  Administered 2017-02-26: 5.9 [IU]/h via INTRAVENOUS
  Filled 2017-02-26: qty 1

## 2017-02-26 MED ORDER — DEXTROSE-NACL 5-0.45 % IV SOLN: INTRAVENOUS | Status: DC

## 2017-02-26 MED ORDER — SODIUM CHLORIDE 0.9 % IV BOLUS (SEPSIS): 1000.0000 mL | Freq: Once | INTRAVENOUS | Status: DC

## 2017-02-26 MED ORDER — SODIUM CHLORIDE 0.9 % IV SOLN
INTRAVENOUS | Status: DC
  Administered 2017-03-01 – 2017-03-02 (×4): via INTRAVENOUS

## 2017-02-26 NOTE — ED Notes (Signed)
Assigned @2127  room 1232  Call consult @2149 

## 2017-02-26 NOTE — ED Notes (Signed)
[facility] PUT IN PLACE 

## 2017-02-26 NOTE — Consult Note (Signed)
PULMONARY / CRITICAL CARE MEDICINE   Name: [name] MRN: [id number] DOB: [birth year]    ADMISSION DATE:  02/26/2017 CONSULTATION DATE:  02/26/2017   REFERRING MD:  Hospitalist   CHIEF COMPLAINT:  Altered mental status   HISTORY OF PRESENT ILLNESS:   26 yr old lady with DM coming in with severe DKA. Patient was well until this morning when she was at work and called her husband saying that she is having a "panic attack" she came home and then started becoming altered. No fever chills or rigors. He noticed that she has been having lose stool and she has been feeling weak and exhausted but she thought its related to working extra for the holidays.   Patient came in to the ED and was found to be hyperglycemic with PH or 6.8. Received 3 litre's of IVf and started on insulin drip. Patient is not able to give any history and most of it was taken from her husband.   PAST MEDICAL HISTORY :  She  has a past medical history of Anxiety, Diabetes type 1, uncontrolled ([place]) (11/14/2011), Fibromyalgia, Gastroparesis, Hypertension, and Infection.  PAST SURGICAL HISTORY: She  has a past surgical history that includes Ankle surgery; Cholecystectomy (11/15/2011); laparoscopy (11/23/2011); and Esophagogastroduodenoscopy (12/03/2011).  Allergies  Allergen Reactions  . Peanut-Containing Drug Products Anaphylaxis and Other (See Comments)    Pt states that she is allergic to [place] nuts.      . Lactose Intolerance (Gi) Other (See Comments)    Gi upset     No current facility-administered medications on file prior to encounter.    Current Outpatient Medications on File Prior to Encounter  Medication Sig  . Ibuprofen (MIDOL) 200 MG CAPS Take 400 mg by mouth daily as needed (pain).  . insulin glargine (LANTUS) 100 UNIT/ML injection Inject 16 Units into the skin 2 (two) times daily. Sometimes has to adjust based on BG levels  . Loperamide HCl (IMODIUM A-D) 1 MG/7.5ML LIQD Take 1 mg by mouth daily as needed  (diarrhea).  . albuterol (PROVENTIL HFA;VENTOLIN HFA) 108 (90 Base) MCG/ACT inhaler Inhale 1-2 puffs into the lungs every 4 (four) hours as needed for wheezing or shortness of breath.  . EPINEPHrine (EPIPEN 2-PAK) 0.3 mg/0.3 mL IJ SOAJ injection Inject 0.3 mLs (0.3 mg total) into the muscle once. (Patient taking differently: Inject 0.3 mg into the muscle as needed (allergic reaction). )  . glucose 4 GM chewable tablet Chew 1 tablet (4 g total) by mouth as needed for low blood sugar.  . insulin lispro (HUMALOG) 100 UNIT/ML injection Inject 8 Units into the skin 3 (three) times daily as needed for high blood sugar. Patient uses sliding scale.  Always uses 8 units but could be more.  Marland Kitchen ketotifen (ZADITOR) 0.025 % ophthalmic solution Place 1 drop into both eyes 2 (two) times daily as needed (dry eyes).  . metoCLOPramide (REGLAN) 10 MG tablet Take 1 tablet (10 mg total) by mouth every 6 (six) hours as needed for nausea or vomiting.    FAMILY HISTORY:  Her indicated that her mother is alive. She indicated that her father is alive.   SOCIAL HISTORY: She  reports that  has never smoked. she has never used smokeless tobacco. She reports that she drinks alcohol. She reports that she uses drugs. Drug: Marijuana.  REVIEW OF SYSTEMS:   Could not be obtained due to AMS     VITAL SIGNS: BP (!) 96/58   Pulse 96   Temp Marland Kitchen)  92 F (33.3 C) (Rectal)   Resp 16   Ht 5' 7"  (1.702 m)   Wt 52.2 kg (115 lb)   SpO2 100%   BMI 18.01 kg/m   HEMODYNAMICS:    VENTILATOR SETTINGS:    INTAKE / OUTPUT: No intake/output data recorded.  PHYSICAL EXAMINATION: General: looks acutely ill, lethargic not in distress  Neuro: lethargic moving all extremities following some commands occasionally HEENT:  Very dry mucus membranes  Cardiovascular:  Normal heart sounds no murmurs  Lungs:  Clear equal air sounds bilaterally no wheezing  Abdomen:  Soft no tenderness no guarding  Musculoskeletal:  No edema Skin:   Hyperpigmented knuckles not tender   LABS:  BMET Recent Labs  Lab 02/26/17 1645 02/26/17 1658  NA 138 139  K 4.0 3.9  CL 110 114*  CO2 <7*  --   BUN 7 4*  CREATININE 0.86 <0.20*  GLUCOSE 430* 461*    Electrolytes Recent Labs  Lab 02/26/17 1645  CALCIUM 8.7*    CBC Recent Labs  Lab 02/26/17 1645 02/26/17 1658  WBC 15.2*  --   HGB 9.2* 9.9*  HCT 29.5* 29.0*  PLT 298  --     Coag's No results for input(s): APTT, INR in the last 168 hours.  Sepsis Markers Recent Labs  Lab 02/26/17 1659  LATICACIDVEN 0.87    ABG No results for input(s): PHART, PCO2ART, PO2ART in the last 168 hours.  Liver Enzymes Recent Labs  Lab 02/26/17 1645  AST 13*  ALT 11*  ALKPHOS 181*  BILITOT 2.1*  ALBUMIN 2.2*    Cardiac Enzymes No results for input(s): TROPONINI, PROBNP in the last 168 hours.  Glucose Recent Labs  Lab 02/26/17 1618 02/26/17 1744 02/26/17 1857  GLUCAP 395* 397* 380*    Imaging Dg Chest Port 1 View  Result Date: 02/26/2017 CLINICAL DATA:  26 year old female with weakness and fatigue. EXAM: PORTABLE CHEST 1 VIEW COMPARISON:  12/14/2016 FINDINGS: The heart size and mediastinal contours are within normal limits. No focal parenchymal opacity or sizable pleural effusion. Small pneumothorax difficult to definitively exclude on supine only radiographs. The visualized skeletal structures are unremarkable. IMPRESSION: No active disease. Electronically Signed   By: Kristopher Oppenheim M.D.   On: 02/26/2017 17:30      ASSESSMENT / PLAN:  Severe DKA Severe Metabolic encephalopathy Severe diarrhea ?gastronteritis vs gastroparesis    Plan: - patient received 3 litres of fluids  - I will bolus 2 more litres of LR. Patient is still very dry and has extra losses from her GI tract.  - insulin drip as per protocol  - BMP q4hr  - electrolytes replacement  - ICU admission and close observation for any further deterioration  - DVT   FAMILY  - Updates: husband  bedside updated      Pulmonary and [place] Pager: [phone]  02/26/2017, 8:14 PM

## 2017-02-26 NOTE — ED Notes (Signed)
BEAR HUGGER PLACED ON PATIENT

## 2017-02-26 NOTE — H&P (Signed)
History and Physical    RONALEE SCHEUNEMANN VEH:[id number] DOB: 1990-05-28 DOA: 02/26/2017  PCP: [name], MD Consultants:  Buddy Duty - endocrinology Patient coming from: Home - lives with husband and daughter (age 27); NOK: husband, [phone]  Chief Complaint: AMS  HPI: LUNA AUDIA is a 26 y.o. female with medical history significant of type 1 DM with h/o remote DKA; HTN; and gastroparesis presenting with AMS.  Today, her husband went to work early this AM.  They work at the same facility and she was due to come in later.  She called him about 11:15 and asked him to come home because she was "having a panic attack."  She specifically wanted him to help her.  She finally took a nap for a couple of hours.  She soiled herself and he decided to take her to the doctor.  She was somewhat incoherent with garbled speech and hallucinations.  For example, she would try to say the word for bathroom and would forget the word, that kind of thing.  She soiled herself again.  He thought it might be a low blood sugar and instead it was high and her temperature was low.  She has been having diarrhea since last night - awoke overnight to go to the bathroom several times.  She does have soreness and rash on her buttocks.  She also complained of a boil on her bottom last week; he thought it was better but it was noticeable today.   She has had DKA, but the last time was a few years ago.  No sick contacts.  They work for [employer] and she has been complaining of being extra tired but they thought it was due to working a lot of overtime.  She did eat/drink normally yesterday.  She drank some water when her husband got home today.  She complained of n/v to him but he did not witness this.  ED Course: Severe DKA.  AMS, better now.  T92.  PH 6.881/pCO2 20/HCO3 3.6.  On insulin drip, IVF.  PCCM recommends we admit and they see the patient tomorrow.  Review of Systems: Unable to assess  Ambulatory Status:  Ambulates without  assistance  Past Medical History:  Diagnosis Date  . Anxiety   . Diabetes type 1, uncontrolled ([place]) 11/14/2011   Since age 40  . Fibromyalgia   . Gastroparesis   . Hypertension   . Infection    UTI April 2016    Past Surgical History:  Procedure Laterality Date  . ANKLE SURGERY    . CHOLECYSTECTOMY  11/15/2011   Procedure: LAPAROSCOPIC CHOLECYSTECTOMY WITH INTRAOPERATIVE CHOLANGIOGRAM;  Surgeon: [name], MD;  Location: [facility];  Service: General;  Laterality: N/A;  . ESOPHAGOGASTRODUODENOSCOPY  12/03/2011   Procedure: ESOPHAGOGASTRODUODENOSCOPY (EGD);  Surgeon: Beryle Beams, MD;  Location: [facility];  Service: Endoscopy;  Laterality: N/A;  . LAPAROSCOPY  11/23/2011   Procedure: LAPAROSCOPY DIAGNOSTIC;  Surgeon: Edward Jolly, MD;  Location: [facility];  Service: General;  Laterality: N/A;    Social History   Socioeconomic History  . Marital status: Married    Spouse name: sergio  . Number of children: 0  . Years of education: college  . Highest education level: Not on file  Social Needs  . Financial resource strain: Not on file  . Food insecurity - worry: Not on file  . Food insecurity - inability: Not on file  . Transportation needs - medical: Not on file  . Transportation needs -  non-medical: Not on file  Occupational History  . Occupation: [employer] call center    Employer: [employer]  Tobacco Use  . Smoking status: Never Smoker  . Smokeless tobacco: Never Used  Substance and Sexual Activity  . Alcohol use: Yes    Alcohol/week: 0.0 oz    Comment: rare   . Drug use: Yes    Types: Marijuana    Comment: uncertain when last use was  . Sexual activity: Not Currently    Partners: Male    Birth control/protection: None  Other Topics Concern  . Not on file  Social History Narrative  . Not on file    Allergies  Allergen Reactions  . Peanut-Containing Drug Products Anaphylaxis and Other (See Comments)    Pt states that she is allergic to  [place] nuts.      . Lactose Intolerance (Gi) Other (See Comments)    Gi upset     Family History  Problem Relation Age of Onset  . Diabetes Mother   . Hypertension Father     Prior to Admission medications   Medication Sig Start Date End Date Taking? Authorizing Provider  albuterol (PROVENTIL HFA;VENTOLIN HFA) 108 (90 Base) MCG/ACT inhaler Inhale 2 puffs into the lungs every 6 (six) hours as needed for wheezing or shortness of breath.    [provider]  albuterol (PROVENTIL HFA;VENTOLIN HFA) 108 (90 Base) MCG/ACT inhaler Inhale 1-2 puffs into the lungs every 4 (four) hours as needed for wheezing or shortness of breath. 09/02/16   Street, [place], PA-C  bismuth subsalicylate (PEPTO BISMOL) 262 MG chewable tablet Chew 524 mg by mouth daily as needed for diarrhea or loose stools.    [provider]  calcium carbonate (TUMS - DOSED IN MG ELEMENTAL CALCIUM) 500 MG chewable tablet Chew 2 tablets by mouth daily as needed for indigestion or heartburn.    [provider]  cephALEXin (KEFLEX) 500 MG capsule Take 1 capsule (500 mg total) by mouth 2 (two) times daily. Patient not taking: Reported on 09/14/2015 05/09/15   Horton, Barbette Hair, MD  ciprofloxacin (CIPRO) 500 MG tablet Take 1 tablet (500 mg total) by mouth 2 (two) times daily. Patient not taking: Reported on 09/14/2015 05/24/15   Montine Circle, PA-C  EPINEPHrine (EPIPEN 2-PAK) 0.3 mg/0.3 mL IJ SOAJ injection Inject 0.3 mLs (0.3 mg total) into the muscle once. 08/10/14   Denney, Rachelle A, CNM  glucose 4 GM chewable tablet Chew 1 tablet (4 g total) by mouth as needed for low blood sugar. 08/10/14   Denney, Rachelle A, CNM  guaiFENesin-codeine 100-10 MG/5ML syrup Take 10 mLs by mouth every 8 (eight) hours as needed for cough. Patient not taking: Reported on 05/13/2016 04/29/15   Quintella Reichert, MD  hydrochlorothiazide (HYDRODIURIL) 25 MG tablet Take 1 tablet (25 mg total) by mouth daily. Patient not taking: Reported on  01/15/2016 01/29/15   Shelly Bombard, MD  ibuprofen (ADVIL,MOTRIN) 600 MG tablet Take 1 tablet (600 mg total) by mouth every 6 (six) hours as needed for mild pain. Patient not taking: Reported on 04/30/2015 01/29/15   Shelly Bombard, MD  Ibuprofen (MIDOL) 200 MG CAPS Take 400 mg by mouth daily as needed (pain).    [provider]  insulin glargine (LANTUS) 100 UNIT/ML injection Inject 16 Units into the skin 2 (two) times daily. Sometimes has to adjust based on BG levels    [provider]  insulin lispro (HUMALOG) 100 UNIT/ML injection Inject 8 Units into the  skin 3 (three) times daily as needed for high blood sugar. Patient uses sliding scale.  Always uses 8 units but could be more.    [provider]  ketotifen (ZADITOR) 0.025 % ophthalmic solution Place 1 drop into both eyes 2 (two) times daily as needed (dry eyes).    [provider]  labetalol (NORMODYNE) 300 MG tablet Take 1 tablet (300 mg total) by mouth 3 (three) times daily. Patient not taking: Reported on 01/15/2016 01/29/15   Shelly Bombard, MD  Loperamide HCl (IMODIUM A-D) 1 MG/7.5ML LIQD Take 1 mg by mouth daily as needed (diarrhea).    [provider]  metoCLOPramide (REGLAN) 10 MG tablet Take 1 tablet (10 mg total) by mouth every 6 (six) hours as needed for nausea or vomiting. 05/13/16   Ward, Ozella Almond, PA-C  Norethindrone Acetate-Ethinyl Estrad-FE (LOESTRIN 24 FE) 1-20 MG-MCG(24) tablet Take 1 tablet by mouth daily. Patient not taking: Reported on 09/14/2015 03/29/15   Shelly Bombard, MD  ondansetron (ZOFRAN) 4 MG tablet Take 1 tablet (4 mg total) by mouth every 8 (eight) hours as needed for nausea or vomiting. Patient not taking: Reported on 05/13/2016 04/30/15   Domenic Moras, PA-C  oxyCODONE-acetaminophen (PERCOCET/ROXICET) 5-325 MG tablet Take 2 tablets by mouth every 4 (four) hours as needed for severe pain. Patient not taking: Reported on 01/15/2016 09/14/15   Gloriann Loan,  PA-C  phenazopyridine (PYRIDIUM) 200 MG tablet Take 1 tablet (200 mg total) by mouth 3 (three) times daily as needed for pain. Patient not taking: Reported on 09/14/2015 05/09/15   Horton, Barbette Hair, MD  potassium chloride SA (K-DUR,KLOR-CON) 20 MEQ tablet Take 1 tablet (20 mEq total) by mouth daily. Patient not taking: Reported on 09/14/2015 04/30/15   Domenic Moras, PA-C  prednisoLONE acetate (PRED FORTE) 1 % ophthalmic suspension Place 1 drop into both eyes 4 (four) times daily. 05/01/16   [provider]  Prenat w/o A-FeCbn-Meth-FA-DHA (OB COMPLETE GOLD) 27.5-1-200 MG CAPS Take 1 tablet by mouth daily. Patient not taking: Reported on 09/14/2015 09/15/14   Morene Crocker, CNM  promethazine (PHENERGAN) 25 MG tablet Take 1 tablet (25 mg total) by mouth every 8 (eight) hours as needed for nausea or vomiting. Patient not taking: Reported on 05/13/2016 01/15/16   Dalia Heading, PA-C  Propylene Glycol 0.6 % SOLN Apply 1 drop to eye 2 (two) times daily.    [provider]  ranitidine (ZANTAC) 150 MG capsule Take 1 capsule (150 mg total) by mouth 2 (two) times daily. Patient not taking: Reported on 09/14/2015 01/02/15   Shelly Bombard, MD  traMADol (ULTRAM) 50 MG tablet Take 1 tablet (50 mg total) by mouth every 6 (six) hours as needed for severe pain. Patient not taking: Reported on 05/13/2016 01/15/16   Dalia Heading, PA-C    Physical Exam: Vitals:   02/27/17 0030 02/27/17 0100 02/27/17 0130 02/27/17 0200  BP: (!) 81/48 (!) 87/45 (!) 69/34 102/62  Pulse: (!) 107 (!) 105 (!) 101 (!) 107  Resp: 14 15 10 12   Temp:      TempSrc:      SpO2: 99% 100% 98% 99%  Weight:      Height:         General: Obtunded, extremely dehydrated Eyes:  PERRL, EOMI, normal lids, iris ENT: extremely dry lips & tongue Neck:  no LAD, masses or thyromegaly Cardiovascular:  RRR, no m/r/g. No LE edema.  Respiratory:   CTA bilaterally with no wheezes/rales/rhonchi.  Normal respiratory  effort. Abdomen:  soft, NT, ND, NABS Skin:  Erythema and skin breakdown along the perirectal area from excessive loose stools Musculoskeletal:  grossly normal tone BUE/BLE, good passive ROM, no bony abnormality Lower extremity:  No LE edema.  Limited foot exam with no ulcerations.  2+ distal pulses. Psychiatric: obtunded, minimally responsive, oriented to person Neurologic: unable to assess    Radiological Exams on Admission: Dg Chest Port 1 View  Result Date: 02/26/2017 CLINICAL DATA:  26 year old female with weakness and fatigue. EXAM: PORTABLE CHEST 1 VIEW COMPARISON:  12/14/2016 FINDINGS: The heart size and mediastinal contours are within normal limits. No focal parenchymal opacity or sizable pleural effusion. Small pneumothorax difficult to definitively exclude on supine only radiographs. The visualized skeletal structures are unremarkable. IMPRESSION: No active disease. Electronically Signed   By: Kristopher Oppenheim M.D.   On: 02/26/2017 17:30    EKG: Independently reviewed.  NSR with rate 95; nonspecific ST changes with no evidence of acute ischemia   Labs on Admission: I have personally reviewed the available labs and imaging studies at the time of the admission.  Pertinent labs:   K+ 3.0, 3.1 Cl 116, 119 CO2 <7, <7, 9 Glucose 430, 355, 203, 162 WBC 14, 11.6 Hgb 9.2, 8.3, 6.4 INR 1.34 Albumin 2.2 ABG: pH 6.881, pCO2 20.4, pO2 83.7, HCO3 3.6   Assessment/Plan Principal Problem:   DKA (diabetic ketoacidosis) (HCC) Active Problems:   Diabetes type 1, uncontrolled (HCC)   Protein-calorie malnutrition, severe (HCC)   Diarrhea   Lower GI bleed   DKA -Patient with poor baseline control (see below) -At approximate baseline last night according to husband -Appears to have diarrhea and/or GI bleeding as possible source -Severe DKA on admission based on pH 6.881, HCO3 3.6, patient obtunded but not comatose -Incalculable anion gap -Will admit to ICU with DKA protocol -PCCM  asked to comanage this patient given the severity of her illness -Would recommend continuing insulin drip at least until morning regardless of rapidity of closure of gap and normalization of labs -K+ lower than 5 and so potassium supplementation added -IVF at 150 cc/hr, NS until glucose <250 and then decrease rate to 125 and change to D51/2NS -q4h BMP -Most recent BMP shows CO2 now detectable at 9  Type 1 DM -Few recent labs in the computer and no recent A1c -Will check A1c -She takes Lantus 16 units BID  GI bleed/Diarrhea -Differential to include bleeding hemorrhoids, bacterial infectious colitis.  -Treatment for this is generally supportive only but given her level of immunocompromise, antibiotics may be appropriate. -Will need GI consult in the AM.  -CBC q6h; transfuse for Hgb <7 (currently giving 2 units due to active bleeding). -Continue to monitor for recurrent bleeding  -GI pathogen panel and C diff are pending   Malnutrition -Suggestive of poor long-term nutritional habits -Likely to benefit from nutrition consult once stable    DVT prophylaxis:  SCDs Code Status:  Full - confirmed with family Family Communication: Husband present throughout evaluation Disposition Plan:  Home once clinically improved Consults called: PCCM  Admission status: Admit - It is my clinical opinion that admission to INPATIENT is reasonable and necessary because this patient will require at least 2 midnights in the hospital to treat this condition based on the medical complexity of the problems presented.  Given the aforementioned information, the predictability of an adverse outcome is felt to be significant.   Total critical care time: 75 minutes Critical care time was exclusive of separately billable procedures and  treating other patients. Critical care was necessary to treat or prevent imminent or life-threatening deterioration. Critical care was time spent personally by me on the following  activities: development of treatment plan with patient and/or surrogate as well as nursing, discussions with consultants, evaluation of patient's response to treatment, examination of patient, obtaining history from patient or surrogate, ordering and performing treatments and interventions, ordering and review of laboratory studies, ordering and review of radiographic studies, pulse oximetry and re-evaluation of patient's condition.   Karmen Bongo MD [facility]  If note is complete, please contact covering daytime or nighttime physician. [email or web address] Password [email or web address]  02/27/2017, 2:22 AM

## 2017-02-26 NOTE — ED Triage Notes (Addendum)
Patient BIB husband, reports patient called stating she was having a panic attack this morning. Per husband, at approx 3pm patient began hallucinating and "talking out of her head." Patient incontinent of stool. Denies drug and alcohol use. Hx gastroparesis and diabetes. CBG 395 in triage.

## 2017-02-26 NOTE — ED Provider Notes (Signed)
[facility]-EMERGENCY DEPT Provider Note   CSN: [id number] Arrival date & time: 02/26/17  1615     History   Chief Complaint Chief Complaint  Patient presents with  . Altered Mental Status    HPI [name] is a 26 y.o. female.  Patient was feeling tired yesterday and started having lots of diarrhea today.  Her significant other came home and said she was confused.  Patient has diabetes   The history is provided by the patient and a relative. No language interpreter was used.  Illness  This is a recurrent problem. The current episode started 12 to 24 hours ago. The problem occurs constantly. The problem has not changed since onset.Pertinent negatives include no chest pain and no abdominal pain. Nothing aggravates the symptoms. Nothing relieves the symptoms. She has tried nothing for the symptoms.    Past Medical History:  Diagnosis Date  . Anxiety   . Diabetes mellitus   . Diabetes type 1, uncontrolled (HCC) 11/14/2011   Since age 42  . Fibromyalgia   . Gastroparesis   . Hypertension   . Infection    UTI April 2016    Patient Active Problem List   Diagnosis Date Noted  . Forceps or vacuum extractor delivery 01/26/2015  . Pregnant 01/24/2015  . Hypertension in pregnancy, antepartum 01/16/2015  . Pre-existing essential hypertension complicating pregnancy in second trimester   . Abnormal first trimester screen   . Encounter for fetal anatomic survey   . [redacted] weeks gestation of pregnancy   . Encounter for (NT) nuchal translucency scan   . First trimester screening   . [redacted] weeks gestation of pregnancy   . Pre-existing type 1 diabetes mellitus during pregnancy in first trimester   . Hypertension in pregnancy   . DKA (diabetic ketoacidoses) (HCC) 06/11/2013  . DKA, type 1 (HCC) 06/11/2013  . Orthostatic hypotension dysautonomic syndrome (HCC) 04/28/2013  . Body aches 04/28/2013  . Orthostatic hypotension 04/28/2013  . Diabetes mellitus type 1  (HCC) 04/28/2013  . Protein-calorie malnutrition, severe (HCC) 04/28/2013  . Noncompliance with diabetes treatment 09/01/2012  . Epigastric abdominal pain 03/15/2012  . Dyspnea 02/28/2012  . Sinus tachycardia 02/28/2012  . Hyperglycemia without ketosis 02/28/2012  . Chest pain 12/04/2011  . Diabetes type 1, uncontrolled (HCC) 11/14/2011  . Hypertension associated with diabetes (HCC) 11/14/2011  . Gastroparesis 11/14/2011  . Hyponatremia 09/19/2011  . Abdominal pain 09/18/2011  . Metabolic alkalosis 09/18/2011  . Diabetic hyperosmolar non-ketotic state (HCC) 09/18/2011  . DIAB W/UNSPEC COMP TYPE I [JUV TYPE] UNCNTRL 06/04/2010  . CONSTIPATION 01/01/2010  . RECTAL PAIN 01/01/2010    Past Surgical History:  Procedure Laterality Date  . ANKLE SURGERY    . CHOLECYSTECTOMY  11/15/2011   Procedure: LAPAROSCOPIC CHOLECYSTECTOMY WITH INTRAOPERATIVE CHOLANGIOGRAM;  Surgeon: [name], MD;  Location: [facility];  Service: General;  Laterality: N/A;  . ESOPHAGOGASTRODUODENOSCOPY  12/03/2011   Procedure: ESOPHAGOGASTRODUODENOSCOPY (EGD);  Surgeon: Theda Belfast, MD;  Location: [facility];  Service: Endoscopy;  Laterality: N/A;  . LAPAROSCOPY  11/23/2011   Procedure: LAPAROSCOPY DIAGNOSTIC;  Surgeon: Mariella Saa, MD;  Location: [facility];  Service: General;  Laterality: N/A;    OB History    Gravida Para Term Preterm AB Living   1 1   1   1    SAB TAB Ectopic Multiple Live Births         0 1       Home Medications    Prior  to Admission medications   Medication Sig Start Date End Date Taking? Authorizing Provider  albuterol (PROVENTIL HFA;VENTOLIN HFA) 108 (90 Base) MCG/ACT inhaler Inhale 2 puffs into the lungs every 6 (six) hours as needed for wheezing or shortness of breath.    [provider]  albuterol (PROVENTIL HFA;VENTOLIN HFA) 108 (90 Base) MCG/ACT inhaler Inhale 1-2 puffs into the lungs every 4 (four) hours as needed for wheezing or shortness of breath. 09/02/16    Street, PioneerMercedes, PA-C  bismuth subsalicylate (PEPTO BISMOL) 262 MG chewable tablet Chew 524 mg by mouth daily as needed for diarrhea or loose stools.    [provider]  calcium carbonate (TUMS - DOSED IN MG ELEMENTAL CALCIUM) 500 MG chewable tablet Chew 2 tablets by mouth daily as needed for indigestion or heartburn.    [provider]  cephALEXin (KEFLEX) 500 MG capsule Take 1 capsule (500 mg total) by mouth 2 (two) times daily. Patient not taking: Reported on 09/14/2015 05/09/15   Horton, Mayer Maskerourtney F, MD  ciprofloxacin (CIPRO) 500 MG tablet Take 1 tablet (500 mg total) by mouth 2 (two) times daily. Patient not taking: Reported on 09/14/2015 05/24/15   Roxy HorsemanBrowning, Robert, PA-C  EPINEPHrine (EPIPEN 2-PAK) 0.3 mg/0.3 mL IJ SOAJ injection Inject 0.3 mLs (0.3 mg total) into the muscle once. 08/10/14   Denney, Rachelle A, CNM  glucose 4 GM chewable tablet Chew 1 tablet (4 g total) by mouth as needed for low blood sugar. 08/10/14   Denney, Rachelle A, CNM  guaiFENesin-codeine 100-10 MG/5ML syrup Take 10 mLs by mouth every 8 (eight) hours as needed for cough. Patient not taking: Reported on 05/13/2016 04/29/15   Tilden Fossaees, Elizabeth, MD  hydrochlorothiazide (HYDRODIURIL) 25 MG tablet Take 1 tablet (25 mg total) by mouth daily. Patient not taking: Reported on 01/15/2016 01/29/15   Brock BadHarper, Charles A, MD  ibuprofen (ADVIL,MOTRIN) 600 MG tablet Take 1 tablet (600 mg total) by mouth every 6 (six) hours as needed for mild pain. Patient not taking: Reported on 04/30/2015 01/29/15   Brock BadHarper, Charles A, MD  Ibuprofen (MIDOL) 200 MG CAPS Take 400 mg by mouth daily as needed (pain).    [provider]  insulin glargine (LANTUS) 100 UNIT/ML injection Inject 16 Units into the skin 2 (two) times daily. Sometimes has to adjust based on BG levels    [provider]  insulin lispro (HUMALOG) 100 UNIT/ML injection Inject 8 Units into the skin 3 (three) times daily as needed for high blood sugar. Patient  uses sliding scale.  Always uses 8 units but could be more.    [provider]  ketotifen (ZADITOR) 0.025 % ophthalmic solution Place 1 drop into both eyes 2 (two) times daily as needed (dry eyes).    [provider]  labetalol (NORMODYNE) 300 MG tablet Take 1 tablet (300 mg total) by mouth 3 (three) times daily. Patient not taking: Reported on 01/15/2016 01/29/15   Brock BadHarper, Charles A, MD  Loperamide HCl (IMODIUM A-D) 1 MG/7.5ML LIQD Take 1 mg by mouth daily as needed (diarrhea).    [provider]  metoCLOPramide (REGLAN) 10 MG tablet Take 1 tablet (10 mg total) by mouth every 6 (six) hours as needed for nausea or vomiting. 05/13/16   Ward, Chase PicketJaime Pilcher, PA-C  Norethindrone Acetate-Ethinyl Estrad-FE (LOESTRIN 24 FE) 1-20 MG-MCG(24) tablet Take 1 tablet by mouth daily. Patient not taking: Reported on 09/14/2015 03/29/15   Brock BadHarper, Charles A, MD  ondansetron (ZOFRAN) 4 MG tablet Take 1 tablet (4 mg total)  by mouth every 8 (eight) hours as needed for nausea or vomiting. Patient not taking: Reported on 05/13/2016 04/30/15   Fayrene Helper, PA-C  oxyCODONE-acetaminophen (PERCOCET/ROXICET) 5-325 MG tablet Take 2 tablets by mouth every 4 (four) hours as needed for severe pain. Patient not taking: Reported on 01/15/2016 09/14/15   Cheri Fowler, PA-C  phenazopyridine (PYRIDIUM) 200 MG tablet Take 1 tablet (200 mg total) by mouth 3 (three) times daily as needed for pain. Patient not taking: Reported on 09/14/2015 05/09/15   Horton, Mayer Masker, MD  potassium chloride SA (K-DUR,KLOR-CON) 20 MEQ tablet Take 1 tablet (20 mEq total) by mouth daily. Patient not taking: Reported on 09/14/2015 04/30/15   Fayrene Helper, PA-C  prednisoLONE acetate (PRED FORTE) 1 % ophthalmic suspension Place 1 drop into both eyes 4 (four) times daily. 05/01/16   [provider]  Prenat w/o A-FeCbn-Meth-FA-DHA (OB COMPLETE GOLD) 27.5-1-200 MG CAPS Take 1 tablet by mouth daily. Patient not taking: Reported on 09/14/2015  09/15/14   Roe Coombs, CNM  promethazine (PHENERGAN) 25 MG tablet Take 1 tablet (25 mg total) by mouth every 8 (eight) hours as needed for nausea or vomiting. Patient not taking: Reported on 05/13/2016 01/15/16   Charlestine Night, PA-C  Propylene Glycol 0.6 % SOLN Apply 1 drop to eye 2 (two) times daily.    [provider]  ranitidine (ZANTAC) 150 MG capsule Take 1 capsule (150 mg total) by mouth 2 (two) times daily. Patient not taking: Reported on 09/14/2015 01/02/15   Brock Bad, MD  traMADol (ULTRAM) 50 MG tablet Take 1 tablet (50 mg total) by mouth every 6 (six) hours as needed for severe pain. Patient not taking: Reported on 05/13/2016 01/15/16   Charlestine Night, PA-C    Family History Family History  Problem Relation Age of Onset  . Diabetes Mother   . Hypertension Father     Social History Social History   Tobacco Use  . Smoking status: Never Smoker  . Smokeless tobacco: Never Used  Substance Use Topics  . Alcohol use: Yes    Alcohol/week: 0.0 oz  . Drug use: No     Allergies   Peanut-containing drug products and Lactose intolerance (gi)   Review of Systems Review of Systems  Unable to perform ROS: Mental status change  Cardiovascular: Negative for chest pain.  Gastrointestinal: Negative for abdominal pain.     Physical Exam Updated Vital Signs BP (!) 94/57   Pulse 86   Temp (!) 92 F (33.3 C) (Rectal)   Resp 18   Ht 5\' 7"  (1.702 m)   Wt 52.2 kg (115 lb)   SpO2 100%   BMI 18.01 kg/m   Physical Exam  Constitutional: She appears well-developed.  HENT:  Head: Normocephalic.  Eyes: Conjunctivae and EOM are normal. No scleral icterus.  Neck: Neck supple. No thyromegaly present.  Cardiovascular: Normal rate and regular rhythm. Exam reveals no gallop and no friction rub.  No murmur heard. Pulmonary/Chest: No stridor. She has no wheezes. She has no rales. She exhibits no tenderness.  Abdominal: She exhibits no distension. There  is no tenderness. There is no rebound.  Musculoskeletal: Normal range of motion. She exhibits no edema.  Lymphadenopathy:    She has no cervical adenopathy.  Neurological: She exhibits normal muscle tone. Coordination normal.  Patient lethargic.  Oriented to person only  Skin: No rash noted. No erythema.     ED Treatments / Results  Labs (all labs ordered are listed, but only abnormal  results are displayed) Labs Reviewed  CBC WITH DIFFERENTIAL/PLATELET - Abnormal; Notable for the following components:      Result Value   WBC 15.2 (*)    RBC 2.97 (*)    Hemoglobin 9.2 (*)    HCT 29.5 (*)    Neutro Abs 12.0 (*)    Monocytes Absolute 2.1 (*)    All other components within normal limits  COMPREHENSIVE METABOLIC PANEL - Abnormal; Notable for the following components:   CO2 <7 (*)    Glucose, Bld 430 (*)    Calcium 8.7 (*)    Albumin 2.2 (*)    AST 13 (*)    ALT 11 (*)    Alkaline Phosphatase 181 (*)    Total Bilirubin 2.1 (*)    All other components within normal limits  BLOOD GAS, VENOUS - Abnormal; Notable for the following components:   pH, Ven 6.881 (*)    pCO2, Ven 20.4 (*)    pO2, Ven 83.7 (*)    Bicarbonate 3.6 (*)    Acid-base deficit 28.8 (*)    All other components within normal limits  CBG MONITORING, ED - Abnormal; Notable for the following components:   Glucose-Capillary 395 (*)    All other components within normal limits  I-STAT CHEM 8, ED - Abnormal; Notable for the following components:   Chloride 114 (*)    BUN 4 (*)    Creatinine, Ser <0.20 (*)    Glucose, Bld 461 (*)    TCO2 6 (*)    Hemoglobin 9.9 (*)    HCT 29.0 (*)    All other components within normal limits  CBG MONITORING, ED - Abnormal; Notable for the following components:   Glucose-Capillary 397 (*)    All other components within normal limits  CULTURE, BLOOD (ROUTINE X 2)  CULTURE, BLOOD (ROUTINE X 2)  URINALYSIS, ROUTINE W REFLEX MICROSCOPIC  I-STAT CG4 LACTIC ACID, ED  I-STAT BETA  HCG BLOOD, ED ([facility], [facility], [facility] ONLY)  I-STAT CG4 LACTIC ACID, ED  I-STAT CG4 LACTIC ACID, ED  I-STAT CG4 LACTIC ACID, ED    EKG  EKG Interpretation None       Radiology Dg Chest Port 1 View  Result Date: 02/26/2017 CLINICAL DATA:  26 year old female with weakness and fatigue. EXAM: PORTABLE CHEST 1 VIEW COMPARISON:  12/14/2016 FINDINGS: The heart size and mediastinal contours are within normal limits. No focal parenchymal opacity or sizable pleural effusion. Small pneumothorax difficult to definitively exclude on supine only radiographs. The visualized skeletal structures are unremarkable. IMPRESSION: No active disease. Electronically Signed   By: Sande Brothers M.D.   On: 02/26/2017 17:30    Procedures Procedures (including critical care time)  Medications Ordered in ED Medications  dextrose 5 %-0.45 % sodium chloride infusion (not administered)  insulin regular bolus via infusion 0-10 Units (not administered)  insulin regular (NOVOLIN R,HUMULIN R) 100 Units in sodium chloride 0.9 % 100 mL (1 Units/mL) infusion (3.4 Units/hr Intravenous New Bag/Given 02/26/17 1758)  dextrose 50 % solution 25 mL (not administered)  0.9 %  sodium chloride infusion (not administered)  sodium chloride 0.9 % bolus 1,000 mL (not administered)  sodium chloride 0.9 % bolus 2,000 mL (2,000 mLs Intravenous New Bag/Given 02/26/17 1650)     Initial Impression / Assessment and Plan / ED Course  I have reviewed the triage vital signs and the nursing notes.  Pertinent labs & imaging results that were available during my care of the patient were reviewed by me  and considered in my medical decision making (see chart for details). CRITICAL CARE Performed by: Bethann Berkshire Total critical care time: 40 minutes Critical care time was exclusive of separately billable procedures and treating other patients. Critical care was necessary to treat or prevent imminent or life-threatening deterioration. Critical care was  time spent personally by me on the following activities: development of treatment plan with patient and/or surrogate as well as nursing, discussions with consultants, evaluation of patient's response to treatment, examination of patient, obtaining history from patient or surrogate, ordering and performing treatments and interventions, ordering and review of laboratory studies, ordering and review of radiographic studies, pulse oximetry and re-evaluation of patient's condition.    Patient with severe DKA.  I spoke with critical care and they prefer medicine admit with them consulting.  They also suggested holding off on antibiotics and no bicarb.   Final Clinical Impressions(s) / ED Diagnoses   Final diagnoses:  Diabetic ketoacidosis without coma associated with diabetes mellitus due to underlying condition [facility])    ED Discharge Orders    None       Bethann Berkshire, MD 02/26/17 [phone]

## 2017-02-27 ENCOUNTER — Other Ambulatory Visit: Payer: Self-pay

## 2017-02-27 DIAGNOSIS — E0810 Diabetes mellitus due to underlying condition with ketoacidosis without coma: Secondary | ICD-10-CM

## 2017-02-27 DIAGNOSIS — E10641 Type 1 diabetes mellitus with hypoglycemia with coma: Secondary | ICD-10-CM

## 2017-02-27 DIAGNOSIS — K922 Gastrointestinal hemorrhage, unspecified: Secondary | ICD-10-CM | POA: Diagnosis present

## 2017-02-27 DIAGNOSIS — R197 Diarrhea, unspecified: Secondary | ICD-10-CM | POA: Diagnosis present

## 2017-02-27 LAB — BLOOD CULTURE ID PANEL (REFLEXED)

## 2017-02-27 LAB — CBC WITH DIFFERENTIAL/PLATELET
Basophils Absolute: 0 10*3/uL (ref 0.0–0.1)
Basophils Absolute: 0 10*3/uL (ref 0.0–0.1)
Basophils Absolute: 0 10*3/uL (ref 0.0–0.1)
Basophils Absolute: 0 10*3/uL (ref 0.0–0.1)
Basophils Absolute: 0 10*3/uL (ref 0.0–0.1)
Basophils Relative: 0 %
Basophils Relative: 0 %
Basophils Relative: 0 %
Basophils Relative: 0 %
Basophils Relative: 0 %
Eosinophils Absolute: 0 10*3/uL (ref 0.0–0.7)
Eosinophils Absolute: 0 10*3/uL (ref 0.0–0.7)
Eosinophils Absolute: 0 10*3/uL (ref 0.0–0.7)
Eosinophils Absolute: 0 10*3/uL (ref 0.0–0.7)
Eosinophils Absolute: 0 10*3/uL (ref 0.0–0.7)
Eosinophils Relative: 0 %
Eosinophils Relative: 0 %
Eosinophils Relative: 0 %
Eosinophils Relative: 0 %
Eosinophils Relative: 0 %
HCT: 19.1 % — ABNORMAL LOW (ref 36.0–46.0)
HCT: 19.9 % — ABNORMAL LOW (ref 36.0–46.0)
HCT: 25.4 % — ABNORMAL LOW (ref 36.0–46.0)
HCT: 25.9 % — ABNORMAL LOW (ref 36.0–46.0)
HCT: 26.5 % — ABNORMAL LOW (ref 36.0–46.0)
Hemoglobin: 6.4 g/dL — CL (ref 12.0–15.0)
Hemoglobin: 6.8 g/dL — CL (ref 12.0–15.0)
Hemoglobin: 8.6 g/dL — ABNORMAL LOW (ref 12.0–15.0)
Hemoglobin: 8.7 g/dL — ABNORMAL LOW (ref 12.0–15.0)
Hemoglobin: 9 g/dL — ABNORMAL LOW (ref 12.0–15.0)
Lymphocytes Relative: 10 %
Lymphocytes Relative: 12 %
Lymphocytes Relative: 6 %
Lymphocytes Relative: 9 %
Lymphocytes Relative: 9 %
Lymphs Abs: 0.7 10*3/uL (ref 0.7–4.0)
Lymphs Abs: 0.8 10*3/uL (ref 0.7–4.0)
Lymphs Abs: 1 10*3/uL (ref 0.7–4.0)
Lymphs Abs: 1 10*3/uL (ref 0.7–4.0)
Lymphs Abs: 1.2 10*3/uL (ref 0.7–4.0)
MCH: 30.7 pg (ref 26.0–34.0)
MCH: 30.9 pg (ref 26.0–34.0)
MCH: 31 pg (ref 26.0–34.0)
MCH: 32 pg (ref 26.0–34.0)
MCH: 32.1 pg (ref 26.0–34.0)
MCHC: 33.5 g/dL (ref 30.0–36.0)
MCHC: 33.6 g/dL (ref 30.0–36.0)
MCHC: 33.9 g/dL (ref 30.0–36.0)
MCHC: 34 g/dL (ref 30.0–36.0)
MCHC: 34.2 g/dL (ref 30.0–36.0)
MCV: 90.7 fL (ref 78.0–100.0)
MCV: 91.4 fL (ref 78.0–100.0)
MCV: 91.8 fL (ref 78.0–100.0)
MCV: 93.9 fL (ref 78.0–100.0)
MCV: 95.5 fL (ref 78.0–100.0)
Monocytes Absolute: 1.3 10*3/uL — ABNORMAL HIGH (ref 0.1–1.0)
Monocytes Absolute: 1.3 10*3/uL — ABNORMAL HIGH (ref 0.1–1.0)
Monocytes Absolute: 1.4 10*3/uL — ABNORMAL HIGH (ref 0.1–1.0)
Monocytes Absolute: 1.7 10*3/uL — ABNORMAL HIGH (ref 0.1–1.0)
Monocytes Absolute: 1.9 10*3/uL — ABNORMAL HIGH (ref 0.1–1.0)
Monocytes Relative: 12 %
Monocytes Relative: 14 %
Monocytes Relative: 14 %
Monocytes Relative: 16 %
Monocytes Relative: 17 %
Neutro Abs: 7.1 10*3/uL (ref 1.7–7.7)
Neutro Abs: 7.4 10*3/uL (ref 1.7–7.7)
Neutro Abs: 7.4 10*3/uL (ref 1.7–7.7)
Neutro Abs: 9.2 10*3/uL — ABNORMAL HIGH (ref 1.7–7.7)
Neutro Abs: 9.4 10*3/uL — ABNORMAL HIGH (ref 1.7–7.7)
Neutrophils Relative %: 71 %
Neutrophils Relative %: 76 %
Neutrophils Relative %: 77 %
Neutrophils Relative %: 78 %
Neutrophils Relative %: 79 %
Platelets: 167 10*3/uL (ref 150–400)
Platelets: 173 10*3/uL (ref 150–400)
Platelets: 186 10*3/uL (ref 150–400)
Platelets: 197 10*3/uL (ref 150–400)
Platelets: 201 10*3/uL (ref 150–400)
RBC: 2 MIL/uL — ABNORMAL LOW (ref 3.87–5.11)
RBC: 2.12 MIL/uL — ABNORMAL LOW (ref 3.87–5.11)
RBC: 2.8 MIL/uL — ABNORMAL LOW (ref 3.87–5.11)
RBC: 2.82 MIL/uL — ABNORMAL LOW (ref 3.87–5.11)
RBC: 2.9 MIL/uL — ABNORMAL LOW (ref 3.87–5.11)
RDW: 14.3 % (ref 11.5–15.5)
RDW: 14.4 % (ref 11.5–15.5)
RDW: 14.8 % (ref 11.5–15.5)
RDW: 14.9 % (ref 11.5–15.5)
RDW: 15.3 % (ref 11.5–15.5)
WBC: 10 10*3/uL (ref 4.0–10.5)
WBC: 11.6 10*3/uL — ABNORMAL HIGH (ref 4.0–10.5)
WBC: 12 10*3/uL — ABNORMAL HIGH (ref 4.0–10.5)
WBC: 9.5 10*3/uL (ref 4.0–10.5)
WBC: 9.7 10*3/uL (ref 4.0–10.5)

## 2017-02-27 LAB — BASIC METABOLIC PANEL
Anion gap: 12 (ref 5–15)
Anion gap: 5 (ref 5–15)
Anion gap: 6 (ref 5–15)
Anion gap: 6 (ref 5–15)
BUN: 5 mg/dL — ABNORMAL LOW (ref 6–20)
BUN: 6 mg/dL (ref 6–20)
BUN: 6 mg/dL (ref 6–20)
BUN: 6 mg/dL (ref 6–20)
CO2: 14 mmol/L — ABNORMAL LOW (ref 22–32)
CO2: 15 mmol/L — ABNORMAL LOW (ref 22–32)
CO2: 17 mmol/L — ABNORMAL LOW (ref 22–32)
CO2: 9 mmol/L — ABNORMAL LOW (ref 22–32)
Calcium: 7.5 mg/dL — ABNORMAL LOW (ref 8.9–10.3)
Calcium: 7.6 mg/dL — ABNORMAL LOW (ref 8.9–10.3)
Calcium: 7.7 mg/dL — ABNORMAL LOW (ref 8.9–10.3)
Calcium: 7.7 mg/dL — ABNORMAL LOW (ref 8.9–10.3)
Chloride: 117 mmol/L — ABNORMAL HIGH (ref 101–111)
Chloride: 119 mmol/L — ABNORMAL HIGH (ref 101–111)
Chloride: 120 mmol/L — ABNORMAL HIGH (ref 101–111)
Chloride: 122 mmol/L — ABNORMAL HIGH (ref 101–111)
Creatinine, Ser: 0.36 mg/dL — ABNORMAL LOW (ref 0.44–1.00)
Creatinine, Ser: 0.5 mg/dL (ref 0.44–1.00)
Creatinine, Ser: 0.53 mg/dL (ref 0.44–1.00)
Creatinine, Ser: 0.73 mg/dL (ref 0.44–1.00)
GFR calc Af Amer: >60 mL/min (ref >60)
GFR calc Af Amer: >60 mL/min (ref >60)
GFR calc Af Amer: >60 mL/min (ref >60)
GFR calc Af Amer: >60 mL/min (ref >60)
GFR calc non Af Amer: >60 mL/min (ref >60)
GFR calc non Af Amer: >60 mL/min (ref >60)
GFR calc non Af Amer: >60 mL/min (ref >60)
GFR calc non Af Amer: >60 mL/min (ref >60)
Glucose, Bld: 116 mg/dL — ABNORMAL HIGH (ref 65–99)
Glucose, Bld: 153 mg/dL — ABNORMAL HIGH (ref 65–99)
Glucose, Bld: 153 mg/dL — ABNORMAL HIGH (ref 65–99)
Glucose, Bld: 203 mg/dL — ABNORMAL HIGH (ref 65–99)
Potassium: 2.9 mmol/L — ABNORMAL LOW (ref 3.5–5.1)
Potassium: 3.1 mmol/L — ABNORMAL LOW (ref 3.5–5.1)
Potassium: 3.1 mmol/L — ABNORMAL LOW (ref 3.5–5.1)
Potassium: 3.1 mmol/L — ABNORMAL LOW (ref 3.5–5.1)
Sodium: 139 mmol/L (ref 135–145)
Sodium: 140 mmol/L (ref 135–145)
Sodium: 141 mmol/L (ref 135–145)
Sodium: 142 mmol/L (ref 135–145)

## 2017-02-27 LAB — LACTIC ACID, PLASMA
Lactic Acid, Venous: 1.4 mmol/L (ref 0.5–1.9)
Lactic Acid, Venous: 1.4 mmol/L (ref 0.5–1.9)

## 2017-02-27 LAB — HEMOGLOBIN A1C
Hgb A1c MFr Bld: 12.2 % — ABNORMAL HIGH (ref 4.8–5.6)
Mean Plasma Glucose: 303.44 mg/dL

## 2017-02-27 LAB — GLUCOSE, CAPILLARY
Glucose-Capillary: 100 mg/dL — ABNORMAL HIGH (ref 65–99)
Glucose-Capillary: 109 mg/dL — ABNORMAL HIGH (ref 65–99)
Glucose-Capillary: 111 mg/dL — ABNORMAL HIGH (ref 65–99)
Glucose-Capillary: 127 mg/dL — ABNORMAL HIGH (ref 65–99)
Glucose-Capillary: 133 mg/dL — ABNORMAL HIGH (ref 65–99)
Glucose-Capillary: 136 mg/dL — ABNORMAL HIGH (ref 65–99)
Glucose-Capillary: 140 mg/dL — ABNORMAL HIGH (ref 65–99)
Glucose-Capillary: 140 mg/dL — ABNORMAL HIGH (ref 65–99)
Glucose-Capillary: 142 mg/dL — ABNORMAL HIGH (ref 65–99)
Glucose-Capillary: 146 mg/dL — ABNORMAL HIGH (ref 65–99)
Glucose-Capillary: 148 mg/dL — ABNORMAL HIGH (ref 65–99)
Glucose-Capillary: 150 mg/dL — ABNORMAL HIGH (ref 65–99)
Glucose-Capillary: 153 mg/dL — ABNORMAL HIGH (ref 65–99)
Glucose-Capillary: 162 mg/dL — ABNORMAL HIGH (ref 65–99)
Glucose-Capillary: 197 mg/dL — ABNORMAL HIGH (ref 65–99)
Glucose-Capillary: 93 mg/dL (ref 65–99)
Glucose-Capillary: 96 mg/dL (ref 65–99)

## 2017-02-27 LAB — PROCALCITONIN: Procalcitonin: 0.2 ng/mL

## 2017-02-27 LAB — PROTIME-INR
INR: 1.34
Prothrombin Time: 16.5 seconds — ABNORMAL HIGH (ref 11.4–15.2)

## 2017-02-27 LAB — RAPID URINE DRUG SCREEN, HOSP PERFORMED
Amphetamines: NOT DETECTED
Barbiturates: NOT DETECTED
Benzodiazepines: NOT DETECTED
Cocaine: NOT DETECTED
Opiates: NOT DETECTED
Tetrahydrocannabinol: NOT DETECTED

## 2017-02-27 LAB — PREPARE RBC (CROSSMATCH)

## 2017-02-27 LAB — MRSA PCR SCREENING: MRSA by PCR: NEGATIVE

## 2017-02-27 LAB — HIV ANTIBODY (ROUTINE TESTING W REFLEX): HIV Screen 4th Generation wRfx: NONREACTIVE

## 2017-02-27 LAB — ABO/RH: ABO/RH(D): O POS

## 2017-02-27 LAB — APTT: aPTT: 33 seconds (ref 24–36)

## 2017-02-27 MED ORDER — INFLUENZA VAC SPLIT QUAD 0.5 ML IM SUSY: 0.5000 mL | PREFILLED_SYRINGE | INTRAMUSCULAR | Status: DC | PRN

## 2017-02-27 MED ORDER — FENTANYL CITRATE (PF) 100 MCG/2ML IJ SOLN
12.5000 ug | INTRAMUSCULAR | Status: DC | PRN
  Administered 2017-02-27 (×7): 25 ug via INTRAVENOUS
  Administered 2017-02-27 (×2): 12.5 ug via INTRAVENOUS
  Filled 2017-02-27 (×9): qty 2

## 2017-02-27 MED ORDER — POTASSIUM CHLORIDE CRYS ER 20 MEQ PO TBCR
40.0000 meq | EXTENDED_RELEASE_TABLET | Freq: Two times a day (BID) | ORAL | Status: DC
  Filled 2017-02-27: qty 2

## 2017-02-27 MED ORDER — ONDANSETRON HCL 4 MG/2ML IJ SOLN
4.0000 mg | Freq: Four times a day (QID) | INTRAMUSCULAR | Status: DC | PRN
  Administered 2017-02-27: 4 mg via INTRAVENOUS
  Filled 2017-02-27: qty 2

## 2017-02-27 MED ORDER — INSULIN ASPART 100 UNIT/ML ~~LOC~~ SOLN
3.0000 [IU] | Freq: Three times a day (TID) | SUBCUTANEOUS | Status: DC
  Administered 2017-03-01: 3 [IU] via SUBCUTANEOUS

## 2017-02-27 MED ORDER — POTASSIUM CHLORIDE 20 MEQ/15ML (10%) PO SOLN
40.0000 meq | Freq: Two times a day (BID) | ORAL | Status: AC
  Administered 2017-02-27 (×2): 40 meq via ORAL
  Filled 2017-02-27 (×2): qty 30

## 2017-02-27 MED ORDER — INSULIN GLARGINE 100 UNIT/ML ~~LOC~~ SOLN
15.0000 [IU] | SUBCUTANEOUS | Status: DC
  Administered 2017-02-27 – 2017-02-28 (×2): 15 [IU] via SUBCUTANEOUS
  Filled 2017-02-27 (×2): qty 0.15

## 2017-02-27 MED ORDER — DEXTROSE-NACL 5-0.45 % IV SOLN
INTRAVENOUS | Status: DC
  Administered 2017-02-27 – 2017-02-28 (×2): via INTRAVENOUS

## 2017-02-27 MED ORDER — SODIUM BICARBONATE 8.4 % IV SOLN
INTRAVENOUS | Status: AC
  Administered 2017-02-27: 11:00:00 via INTRAVENOUS
  Filled 2017-02-27: qty 1000

## 2017-02-27 MED ORDER — POTASSIUM CHLORIDE 10 MEQ/100ML IV SOLN
10.0000 meq | Freq: Once | INTRAVENOUS | Status: AC
  Administered 2017-02-27: 10 meq via INTRAVENOUS
  Filled 2017-02-27: qty 100

## 2017-02-27 MED ORDER — SODIUM CHLORIDE 0.9 % IV SOLN
Freq: Once | INTRAVENOUS | Status: AC
  Administered 2017-02-27: 01:00:00 via INTRAVENOUS

## 2017-02-27 MED ORDER — INSULIN ASPART 100 UNIT/ML ~~LOC~~ SOLN
0.0000 [IU] | Freq: Three times a day (TID) | SUBCUTANEOUS | Status: DC
  Administered 2017-02-27 – 2017-02-28 (×3): 2 [IU] via SUBCUTANEOUS
  Administered 2017-03-01 – 2017-03-02 (×2): 5 [IU] via SUBCUTANEOUS

## 2017-02-27 MED ORDER — INSULIN ASPART 100 UNIT/ML ~~LOC~~ SOLN: 1.0000 [IU] | SUBCUTANEOUS | Status: DC

## 2017-02-27 MED ORDER — TRAMADOL HCL 50 MG PO TABS
50.0000 mg | ORAL_TABLET | Freq: Four times a day (QID) | ORAL | Status: DC | PRN
  Administered 2017-02-28 – 2017-03-04 (×9): 50 mg via ORAL
  Filled 2017-02-27 (×9): qty 1

## 2017-02-27 MED ORDER — PROMETHAZINE HCL 25 MG/ML IJ SOLN
12.5000 mg | Freq: Four times a day (QID) | INTRAMUSCULAR | Status: DC | PRN
  Administered 2017-02-27: 12.5 mg via INTRAVENOUS
  Filled 2017-02-27: qty 1

## 2017-02-27 MED ORDER — FENTANYL CITRATE (PF) 100 MCG/2ML IJ SOLN
12.5000 ug | INTRAMUSCULAR | Status: DC | PRN
  Administered 2017-02-27: 25 ug via INTRAVENOUS
  Filled 2017-02-27: qty 2

## 2017-02-27 NOTE — Progress Notes (Signed)
Case discussed with Dr Titus Mould, Critical care will staff patient today, hospitalist will pick up patient on 12/1.

## 2017-02-27 NOTE — Progress Notes (Signed)
Inpatient Diabetes Program Recommendations  AACE/ADA: New Consensus Statement on Inpatient Glycemic Control (2015)  Target Ranges:  Prepandial:   less than 140 mg/dL      Peak postprandial:   less than 180 mg/dL (1-2 hours)      Critically ill patients:  140 - 180 mg/dL   Lab Results  Component Value Date   GLUCAP 109 (H) 02/27/2017   HGBA1C 12.2 (H) 02/27/2017    Review of Glycemic Control  Diabetes history: DM1 Outpatient Diabetes medications: Lantus 16 units bid, Humalog 8 units tidwc Current orders for Inpatient glycemic control: Lantus 15 units Q24H Novolog 0-15 units tidwc +3 units tidwc  Poor control at home with HgbA1C of 12.2% Long discussion with pt's husband regarding her diabetes. Husband states he does not think pt is taking her insulin as ordered. Thinks she is skipping doses. ? Whether it is a financial issue or just non-compliance. Husband states pt does not talk about her diabetes, always gives shots "in another room."  Husband states she doesn't check blood sugars very often, but does have meter and strips. Seems to be very supportive.  Inpatient Diabetes Program Recommendations:    Decrease Novolog to 0-9 units tidwc and hs Titrate Lantus if  FBS > 180 mg/dL.  Pt has been seen by several inpatient diabetes coordinators about her diabetes control at home.   ? If psych consult is appropriate. May need inexpensive insulin if cost is the factor.  Did not get to speak with pt as she was moaning and crying.  Will follow while hospitalized.  Thank you. Ailene Ards, RD, LDN, CDE Inpatient Diabetes Coordinator [phone]

## 2017-02-27 NOTE — Progress Notes (Signed)
[facility] Progress Note Patient Name: [name] DOB: [birth year] MRN: [id number]   Date of Service  02/27/2017  HPI/Events of Note  Hb 6.4.  eICU Interventions  Will give 1 unit PRBC.        Sinahi Knights 02/27/2017, 1:07 AM

## 2017-02-27 NOTE — Progress Notes (Signed)
eLink Physician-Brief Progress Note Patient Name: [name] DOB: 29-Jun-1990 MRN: [id number]   Date of Service  02/27/2017  HPI/Events of Note  Nurse reporting patient requesting IV fentanyl for pain relief fairly frequently. Subsequently this causes hypotension and sedation. Home med rec reviewed showing Percocet as well as Ultram in the past. Has underlying fibromyalgia but no prior medications listed for treatment.   eICU Interventions  1. Discontinuing fentanyl 2. Ordering tramadol every 6 hours as needed 3. Defer to rounding physician on consulting psychiatry/initiating treatment for fibromyalgia      Intervention Category Intermediate Interventions: Pain - evaluation and management  Lawanda Cousins 02/27/2017, 9:24 PM

## 2017-02-27 NOTE — Care Management Note (Signed)
Case Management Note  Patient Details  Name: [name] MRN: [id number] Date of Birth: 1991/02/16  Subjective/Objective:                  ams  Action/Plan: Date: February 27, 2017 Velva Harman, BSN, [place], [place]  [phone] Chart and notes review for patient progress and needs. Will follow for case management and discharge needs. Next review date: [id number]  Expected Discharge Date:                  Expected Discharge Plan:  Home/Self Care  In-House Referral:     Discharge planning Services  CM Consult  Post Acute Care Choice:    Choice offered to:     DME Arranged:    DME Agency:     HH Arranged:    HH Agency:     Status of Service:  In process, will continue to follow  If discussed at Long Length of Stay Meetings, dates discussed:    Additional Comments:  Leeroy Cha, RN 02/27/2017, 8:54 AM

## 2017-02-27 NOTE — Consult Note (Addendum)
PULMONARY / CRITICAL CARE MEDICINE   Name: [name] MRN: [id number] DOB: May 08, 1990    ADMISSION DATE:  02/26/2017 CONSULTATION DATE:  02/26/2017   REFERRING MD:  Hospitalist   CHIEF COMPLAINT:  Altered mental status   HISTORY OF PRESENT ILLNESS:   26 yr old lady with DM coming in with severe DKA. Patient was well until this morning when she was at work and called her husband saying that she is having a "panic attack" she came home and then started becoming altered. No fever chills or rigors. He noticed that she has been having lose stool and she has been feeling weak and exhausted but she thought its related to working extra for the holidays.   Patient came in to the ED and was found to be hyperglycemic with PH or 6.8. Received 3 litre's of IVf and started on insulin drip. Patient is not able to give any history and most of it was taken from her husband.  VITAL SIGNS: BP (!) 103/54   Pulse (!) 109   Temp 99.6 F (37.6 C) (Axillary)   Resp 15   Ht 5' 7"  (1.702 m)   Wt 52.2 kg (115 lb)   SpO2 97%   BMI 18.01 kg/m   Subjective: On insulin drip still  HEMODYNAMICS:    VENTILATOR SETTINGS:    INTAKE / OUTPUT: I/O last 3 completed shifts: In: 3971.9 [I.V.:891.9; Blood:30; IV [id number]:5732] Out: 2025 [KYHCW:2376]  PHYSICAL EXAMINATION: General:in bed flat, no distress Neuro: awake, fc, moaning HEENT: jvd down PULM: CTA CV:  s1 s2 RRT no r GI: soft, BS low, no r Extremities: chonic changes hands   LABS:  BMET Recent Labs  Lab 02/26/17 2011 02/27/17 0015 02/27/17 0355  NA 139 140 141  142  K 3.0* 3.1* 3.1*  3.1*  CL 116* 119* 120*  122*  CO2 <7* 9* 15*  14*  BUN 8 6 6  6   CREATININE 0.82 0.73 0.53  0.50  GLUCOSE 355* 203* 153*  153*    Electrolytes Recent Labs  Lab 02/26/17 2011 02/27/17 0015 02/27/17 0355  CALCIUM 7.9* 7.7* 7.6*  7.7*  MG 1.9  --   --   PHOS 2.9  --   --     CBC Recent Labs  Lab 02/26/17 2011 02/27/17 0015  02/27/17 0355  WBC 14.0* 11.6* 9.7  HGB 8.3* 6.4* 6.8*  HCT 26.7* 19.1* 19.9*  PLT 222 201 186    Coag's Recent Labs  Lab 02/27/17 0015  APTT 33  INR 1.34    Sepsis Markers Recent Labs  Lab 02/26/17 1659 02/27/17 0015 02/27/17 0355  LATICACIDVEN 0.87 1.4 1.4  PROCALCITON  --  0.20  --     ABG No results for input(s): PHART, PCO2ART, PO2ART in the last 168 hours.  Liver Enzymes Recent Labs  Lab 02/26/17 1645  AST 13*  ALT 11*  ALKPHOS 181*  BILITOT 2.1*  ALBUMIN 2.2*    Cardiac Enzymes No results for input(s): TROPONINI, PROBNP in the last 168 hours.  Glucose Recent Labs  Lab 02/27/17 0316 02/27/17 0434 02/27/17 0536 02/27/17 0647 02/27/17 0735 02/27/17 0752  GLUCAP 140* 142* 133* 140* 136* 150*    Imaging Dg Chest Port 1 View  Result Date: 02/26/2017 CLINICAL DATA:  26 year old female with weakness and fatigue. EXAM: PORTABLE CHEST 1 VIEW COMPARISON:  12/14/2016 FINDINGS: The heart size and mediastinal contours are within normal limits. No focal parenchymal opacity or sizable pleural effusion. Small pneumothorax difficult to  definitively exclude on supine only radiographs. The visualized skeletal structures are unremarkable. IMPRESSION: No active disease. Electronically Signed   By: Kristopher Oppenheim M.D.   On: 02/26/2017 17:30    ASSESSMENT / PLAN:  Severe DKA, improved acidosis AG, with NONAG now - stools?, rta 4? Severe Metabolic encephalopathy Severe diarrhea ?gastronteritis vs gastroparesis  Hypokalemia Anemia, worsened by dilution, no active blood loss noted, may need  GI assessment pre dc Plan: -remains on insulin drip, bicarb is improving on chem with improved neuro status -as AG is closed would now use critical illness hyperglycemia and dc dka -ensure K supp, bmet in pm follow up -consider bicarb drip for 1 liter for NONAG abnd follow stool output, treat to slow if needed -chem in am  -assess ferritin, retic, stool occult -no diet  until off insulin drip If any temps , add gastroenteritis cipro -for one unit prbc, cbc in afternoon - I updated husband in room - no repeat abg needed -assess GI panel  Call if needed  Lavon Paganini. Titus Mould, MD, FACP Pgr: [facility]  [facility] and [place] Pager: [phone]  02/27/2017, 8:35 AM

## 2017-02-28 ENCOUNTER — Inpatient Hospital Stay (HOSPITAL_COMMUNITY): Payer: 59

## 2017-02-28 DIAGNOSIS — R4182 Altered mental status, unspecified: Secondary | ICD-10-CM

## 2017-02-28 DIAGNOSIS — F419 Anxiety disorder, unspecified: Secondary | ICD-10-CM

## 2017-02-28 DIAGNOSIS — D649 Anemia, unspecified: Secondary | ICD-10-CM

## 2017-02-28 DIAGNOSIS — E785 Hyperlipidemia, unspecified: Secondary | ICD-10-CM

## 2017-02-28 DIAGNOSIS — E1310 Other specified diabetes mellitus with ketoacidosis without coma: Secondary | ICD-10-CM

## 2017-02-28 DIAGNOSIS — F1210 Cannabis abuse, uncomplicated: Secondary | ICD-10-CM

## 2017-02-28 DIAGNOSIS — R450 Nervousness: Secondary | ICD-10-CM

## 2017-02-28 DIAGNOSIS — F330 Major depressive disorder, recurrent, mild: Secondary | ICD-10-CM

## 2017-02-28 LAB — C DIFFICILE QUICK SCREEN W PCR REFLEX
C Diff antigen: POSITIVE — AB
C Diff toxin: NEGATIVE

## 2017-02-28 LAB — CBC WITH DIFFERENTIAL/PLATELET
Basophils Absolute: 0 10*3/uL (ref 0.0–0.1)
Basophils Relative: 0 %
Eosinophils Absolute: 0.1 10*3/uL (ref 0.0–0.7)
Eosinophils Relative: 1 %
HCT: 23.8 % — ABNORMAL LOW (ref 36.0–46.0)
Hemoglobin: 8.1 g/dL — ABNORMAL LOW (ref 12.0–15.0)
Lymphocytes Relative: 19 %
Lymphs Abs: 1.6 10*3/uL (ref 0.7–4.0)
MCH: 31 pg (ref 26.0–34.0)
MCHC: 34 g/dL (ref 30.0–36.0)
MCV: 91.2 fL (ref 78.0–100.0)
Monocytes Absolute: 1.8 10*3/uL — ABNORMAL HIGH (ref 0.1–1.0)
Monocytes Relative: 20 %
Neutro Abs: 5.2 10*3/uL (ref 1.7–7.7)
Neutrophils Relative %: 60 %
Platelets: 177 10*3/uL (ref 150–400)
RBC: 2.61 MIL/uL — ABNORMAL LOW (ref 3.87–5.11)
RDW: 15.5 % (ref 11.5–15.5)
WBC: 8.6 10*3/uL (ref 4.0–10.5)

## 2017-02-28 LAB — BASIC METABOLIC PANEL
Anion gap: 3 — ABNORMAL LOW (ref 5–15)
BUN: <5 mg/dL — ABNORMAL LOW (ref 6–20)
CO2: 23 mmol/L (ref 22–32)
Calcium: 7.4 mg/dL — ABNORMAL LOW (ref 8.9–10.3)
Chloride: 109 mmol/L (ref 101–111)
Creatinine, Ser: <0.3 mg/dL — ABNORMAL LOW (ref 0.44–1.00)
Glucose, Bld: 141 mg/dL — ABNORMAL HIGH (ref 65–99)
Potassium: 3.2 mmol/L — ABNORMAL LOW (ref 3.5–5.1)
Sodium: 135 mmol/L (ref 135–145)

## 2017-02-28 LAB — GLUCOSE, CAPILLARY
Glucose-Capillary: 138 mg/dL — ABNORMAL HIGH (ref 65–99)
Glucose-Capillary: 161 mg/dL — ABNORMAL HIGH (ref 65–99)
Glucose-Capillary: 69 mg/dL (ref 65–99)
Glucose-Capillary: 54 mg/dL — ABNORMAL LOW (ref 65–99)
Glucose-Capillary: 75 mg/dL (ref 65–99)
Glucose-Capillary: 77 mg/dL (ref 65–99)

## 2017-02-28 LAB — IRON AND TIBC
Iron: 20 ug/dL — ABNORMAL LOW (ref 28–170)
Saturation Ratios: 19 % (ref 10.4–31.8)
TIBC: 108 ug/dL — ABNORMAL LOW (ref 250–450)
UIBC: 88 ug/dL

## 2017-02-28 LAB — URINE CULTURE: Culture: NO GROWTH

## 2017-02-28 LAB — PROCALCITONIN: Procalcitonin: 0.16 ng/mL

## 2017-02-28 LAB — RETICULOCYTES
RBC.: 2.57 MIL/uL — ABNORMAL LOW (ref 3.87–5.11)
Retic Count, Absolute: 90 10*3/uL (ref 19.0–186.0)
Retic Ct Pct: 3.5 % — ABNORMAL HIGH (ref 0.4–3.1)

## 2017-02-28 LAB — FERRITIN: Ferritin: 272 ng/mL (ref 11–307)

## 2017-02-28 IMAGING — CT CT ABD-PELV W/ CM
2 of 4 series · 15 of 46 positions shown, 17 images · IV contrast (APPLIED)
Comparison: [DATE]

CLINICAL DATA: Chronic diarrhea.  Weight loss.

EXAM:
CT ABDOMEN AND PELVIS WITH CONTRAST
TECHNIQUE: Multidetector CT imaging of the abdomen and pelvis was performed
using the standard protocol following bolus administration of
intravenous contrast.
CONTRAST:  <See Chart> [JY] IOPAMIDOL ([JY]) INJECTION
61%

[Series 2: axial st · axial · 0.84mm/px · z∈[+881,+1361]mm · 12 of 108 slices shown, 14 images]
[im 6/108  soft-tissue]
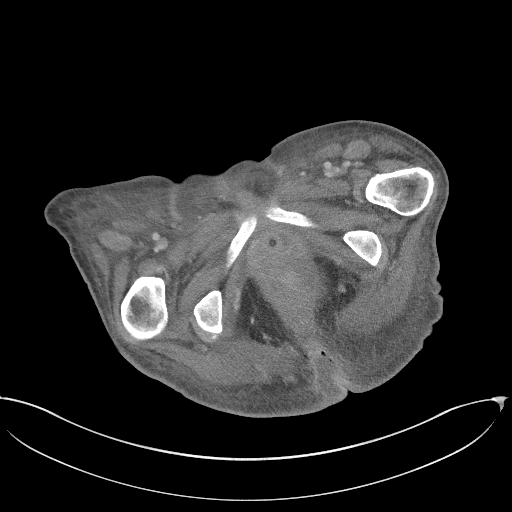
[im 6/108  bone]
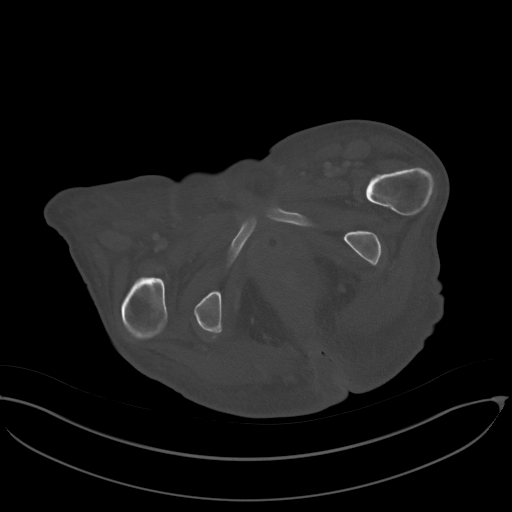
[im 17/108  soft-tissue]
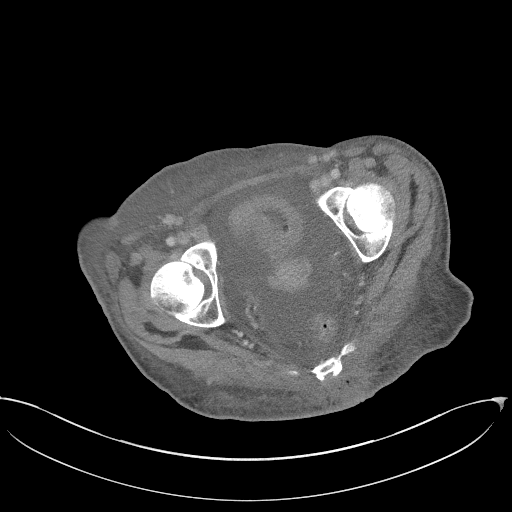
[im 23/108  soft-tissue]
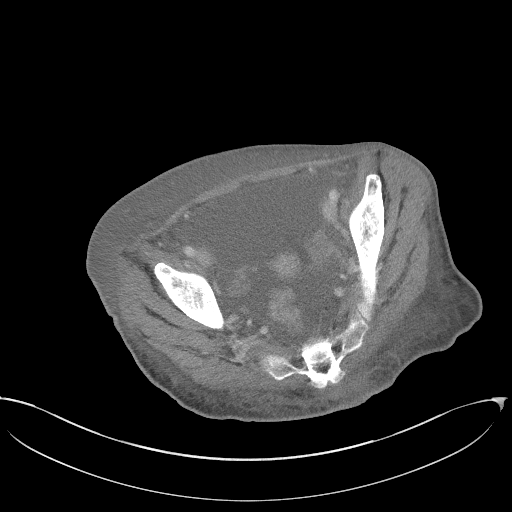
[im 34/108  soft-tissue]
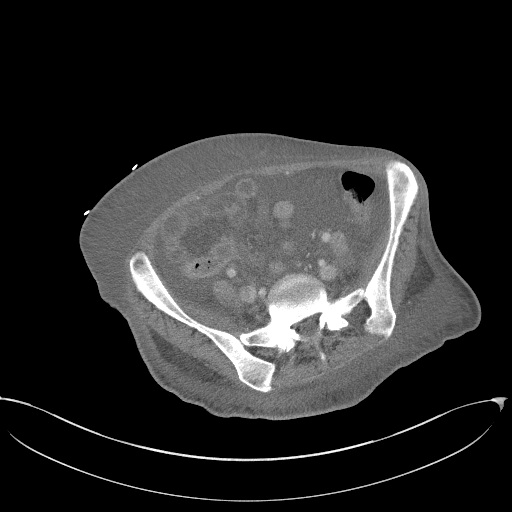
[im 40/108  soft-tissue]
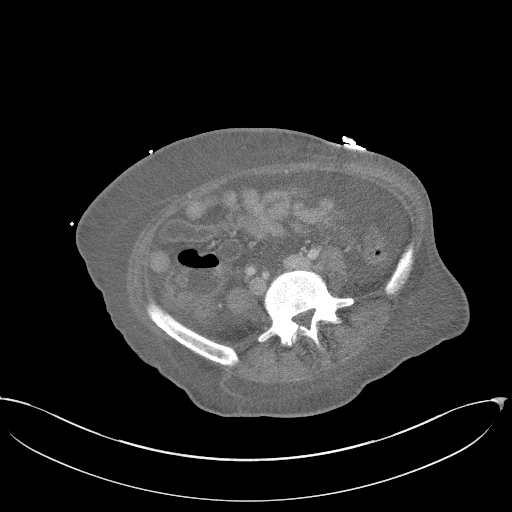
[im 51/108  soft-tissue]
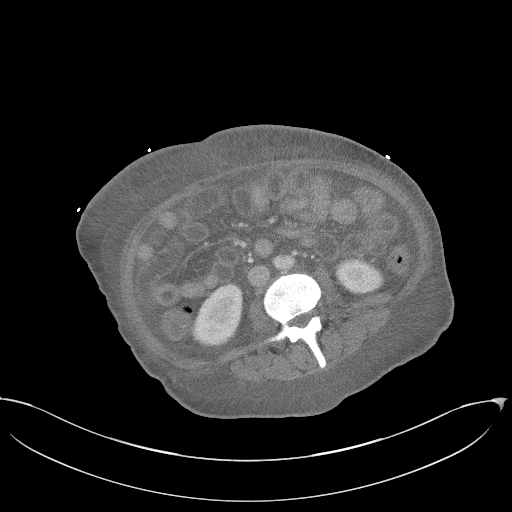
[im 57/108  soft-tissue]
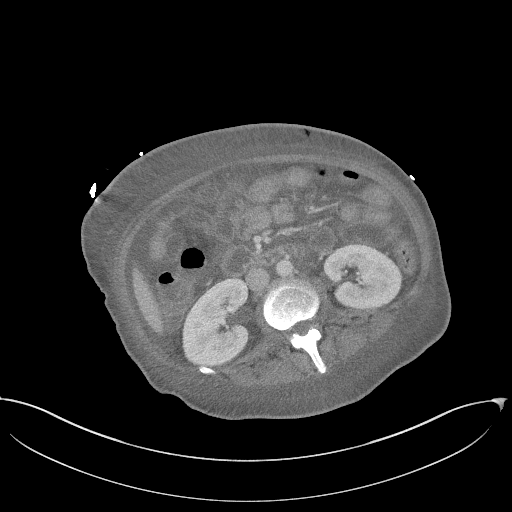
[im 68/108  soft-tissue]
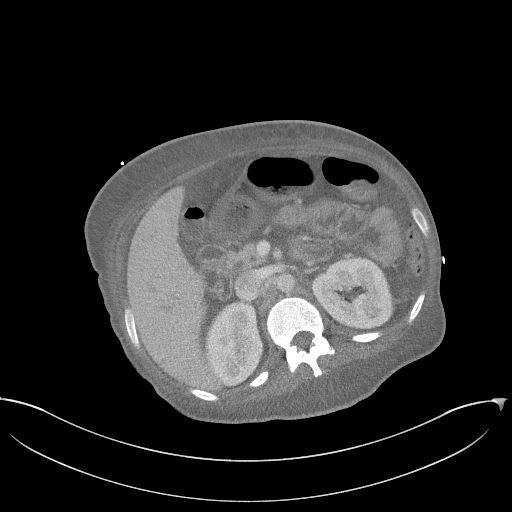
[im 74/108  soft-tissue]
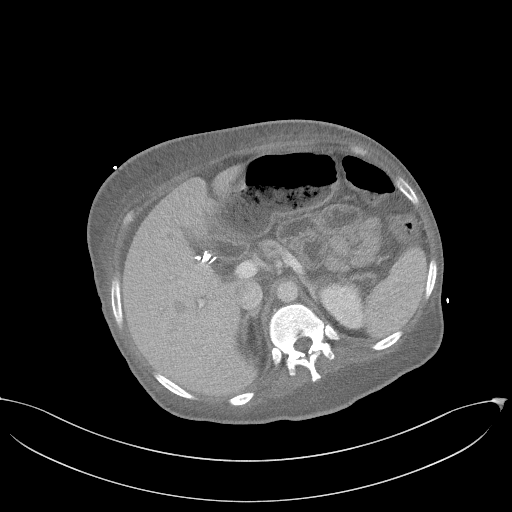
[im 74/108  bone]
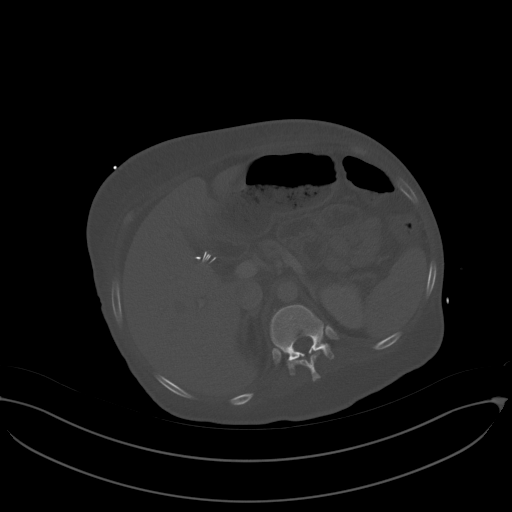
[im 85/108  soft-tissue]
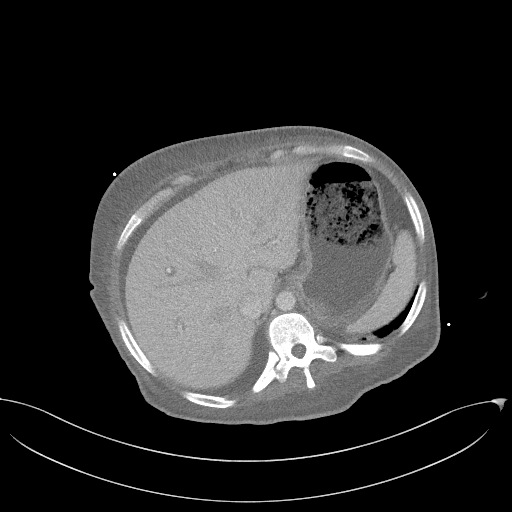
[im 91/108  soft-tissue]
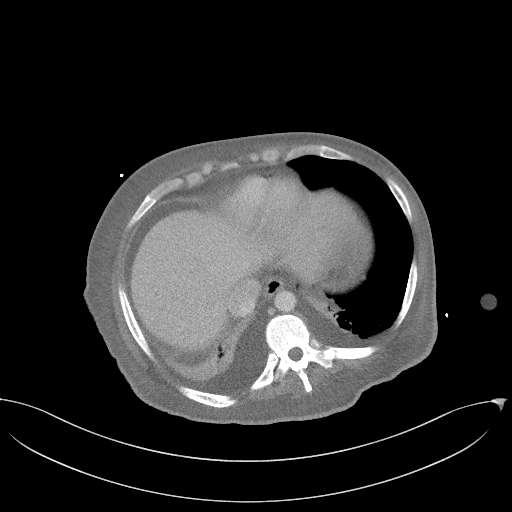
[im 102/108  soft-tissue]
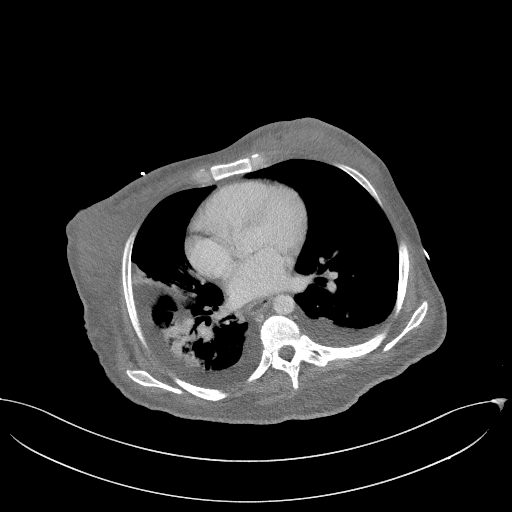

[Series 4: coronal st · coronal · 0.73mm/px · 3 of 95 slices shown]
[im 32/95  soft-tissue]
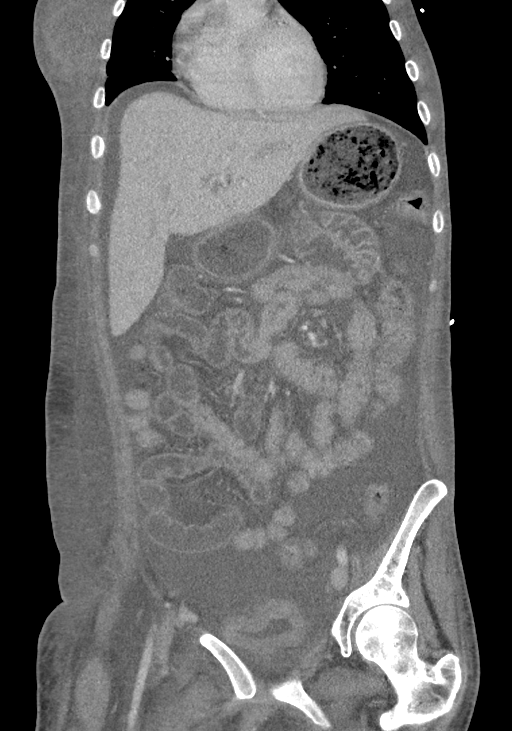
[im 42/95  soft-tissue]
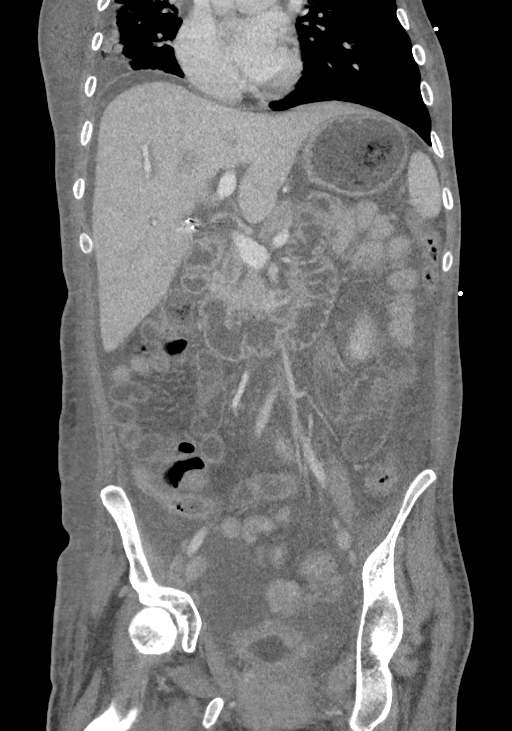
[im 53/95  soft-tissue]
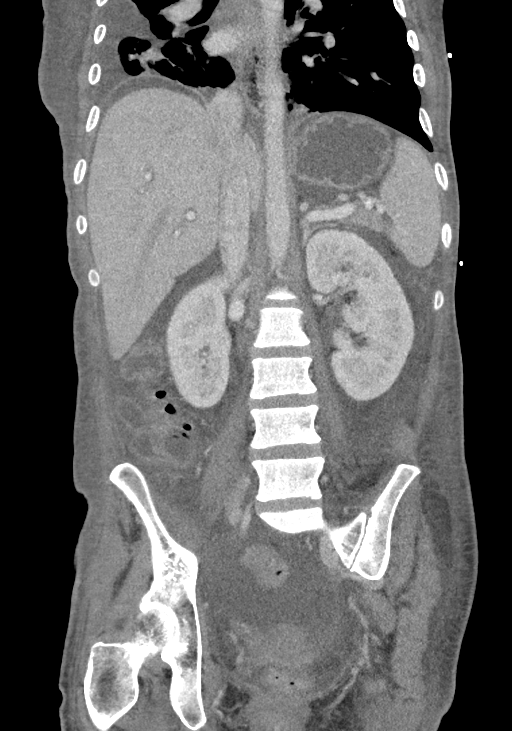

[15 of 46 positions shown; findings below may reference images not displayed]

FINDINGS: Lower chest: Small bilateral pleural effusions are associated with
by basilar collapse/ consolidation.

Hepatobiliary: No focal abnormality within the liver parenchyma.
Mild periportal edema noted. Gallbladder surgically absent. No
intrahepatic or extrahepatic biliary dilation.

Pancreas: No focal mass lesion. No dilatation of the main duct. No
intraparenchymal cyst. No peripancreatic edema.

Spleen: No splenomegaly. No focal mass lesion.

Adrenals/Urinary Tract: No adrenal nodule or mass. Kidneys are
unremarkable. No evidence for hydroureter. Urinary bladder is
decompressed by Foley catheter but bladder wall appears thick
despite the decompressed state.

Stomach/Bowel: Stomach is nondistended. No gastric wall thickening.
No evidence of outlet obstruction. Duodenum is normally positioned
as is the ligament of Treitz. No small bowel wall thickening. No
small bowel dilatation. The terminal ileum is normal. The appendix
is not visualized, but there is no edema or inflammation in the
region of the cecum. No gross colonic mass. No colonic wall
thickening. No substantial diverticular change.

Vascular/Lymphatic: No abdominal aortic aneurysm. No abdominal
aortic atherosclerotic calcification. Small gastrohepatic ligament
lymph node without other lymphadenopathy in the abdomen. No pelvic
sidewall lymphadenopathy.

Reproductive: Uterus unremarkable.  There is no adnexal mass.

Other: Moderate volume ascites is associated with diffuse mesenteric
retroperitoneal and body wall edema.

Musculoskeletal: Complex perirectal abscess tracks posteriorly into
the subcutaneous fat in then up behind the sacrum. The lesion tracks
caudally in the subcutaneous fat deep to the right intergluteal fold
towards the perineum but has been incompletely visualized in its
lower extent.
IMPRESSION: 1. Complex perirectal fluid collection tracks up behind the sacrum
and down along the right intergluteal fold towards the perineum
although inferior extent of the abscess has been incompletely
visualized.
2. Moderate volume ascites with diffuse soft tissue and body wall
edema.
3. Bilateral lower lung collapse/consolidation with small bilateral
pleural effusions.

## 2017-02-28 MED ORDER — IOPAMIDOL (ISOVUE-300) INJECTION 61%: 100.0000 mL | Freq: Once | INTRAVENOUS | Status: DC | PRN

## 2017-02-28 MED ORDER — POTASSIUM CHLORIDE CRYS ER 20 MEQ PO TBCR: 40.0000 meq | EXTENDED_RELEASE_TABLET | Freq: Once | ORAL | Status: DC

## 2017-02-28 MED ORDER — INSULIN GLARGINE 100 UNIT/ML ~~LOC~~ SOLN: 8.0000 [IU] | SUBCUTANEOUS | Status: DC

## 2017-02-28 MED ORDER — MORPHINE SULFATE (PF) 4 MG/ML IV SOLN
2.0000 mg | INTRAVENOUS | Status: AC | PRN
  Administered 2017-02-28 – 2017-03-01 (×3): 2 mg via INTRAVENOUS
  Filled 2017-02-28 (×3): qty 1

## 2017-02-28 MED ORDER — INSULIN GLARGINE 100 UNIT/ML ~~LOC~~ SOLN
8.0000 [IU] | Freq: Every day | SUBCUTANEOUS | Status: DC
  Administered 2017-03-01: 8 [IU] via SUBCUTANEOUS
  Filled 2017-02-28: qty 0.08

## 2017-02-28 MED ORDER — POTASSIUM CHLORIDE 20 MEQ/15ML (10%) PO SOLN
40.0000 meq | Freq: Once | ORAL | Status: AC
  Administered 2017-02-28: 40 meq via ORAL
  Filled 2017-02-28: qty 30

## 2017-02-28 MED ORDER — IOPAMIDOL (ISOVUE-300) INJECTION 61%
INTRAVENOUS | Status: AC
  Administered 2017-02-28: 14:00:00
  Administered 2017-02-28: 80 mL via INTRAVENOUS
  Filled 2017-02-28: qty 100

## 2017-02-28 MED ORDER — PIPERACILLIN-TAZOBACTAM 3.375 G IVPB
3.3750 g | Freq: Three times a day (TID) | INTRAVENOUS | Status: DC
  Administered 2017-02-28 – 2017-03-03 (×10): 3.375 g via INTRAVENOUS
  Filled 2017-02-28 (×10): qty 50

## 2017-02-28 NOTE — Progress Notes (Signed)
Pharmacy Antibiotic Note  [name] is a 26 y.o. female admitted on 02/26/2017 with rectal abscess.  Pharmacy has been consulted for zosyn dosing.  Plan: Zosyn 3.375g IV q8h (4 hour infusion).  Height: 5\' 7"  (170.2 cm) Weight: 115 lb (52.2 kg) IBW/kg (Calculated) : 61.6  Temp (24hrs), Avg:99.6 F (37.6 C), Min:98.3 F (36.8 C), Max:101.7 F (38.7 C)  Recent Labs  Lab 02/26/17 1659 02/26/17 2011 02/27/17 0015 02/27/17 0355 02/27/17 1136 02/27/17 1200 02/27/17 1821 02/27/17 2348 02/28/17 0700  WBC  --  14.0* 11.6* 9.7  --  12.0* 9.5 10.0 8.6  CREATININE  --  0.82 0.73 0.53  0.50 0.36*  --   --   --  <0.30*  LATICACIDVEN 0.87  --  1.4 1.4  --   --   --   --   --     CrCl cannot be calculated (This lab value cannot be used to calculate CrCl because it is not a number: <0.30).    Allergies  Allergen Reactions  . Peanut-Containing Drug Products Anaphylaxis and Other (See Comments)    Pt states that she is allergic to [place] nuts.      . Lactose Intolerance (Gi) Other (See Comments)    Gi upset    Antimicrobials this admission: 12/1 zosyn>> Microbiology results:  11/29 at 1655 BCx x2: ngtd 11/29 at 2300 bcx x2:ngtd 11/29 UCx: NGF 11/29 MRSA PCR: neg 11/29 SA PCR NEG 11/30 HIV neg 12/1: BCID 1 of 4 bottles1655 Gm + cocci in chains, lengthy discussion with Dr Jamison Neighbor contamination vs infection. Holding off on abx for now.(from 12/1 at 1202 am)  Thank you for allowing pharmacy to be a part of this patient's care. Herby Abraham, Pharm.D. [phone] 02/28/2017 12:29 PM

## 2017-02-28 NOTE — Progress Notes (Signed)
Hypoglycemic Event  CBG: 69  Treatment: 15 GM carbohydrate snack  Symptoms: None  Follow-up CBG: Time: s/p 15 min CBG Result: 54  Possible Reasons for Event: Inadequate meal intake  Comments/MD notified: gave 15gm more of carbs and followed up to a CBG of 74, Dr. Roda Shutters made aware    Dorthey Sawyer

## 2017-02-28 NOTE — Progress Notes (Addendum)
PROGRESS NOTE  [name] KPV:[id number] DOB: 1991-02-07 DOA: 02/26/2017 PCP: [name], MD  HPI/Recap of past 24 hours:  Off insulin drip  Tmax 101.7 (3pm yesterday)  She is Crying , c/o her bottom is irritating Report diarrhea for the last 6-8 months and weight loss, She was Dr Collene Mares twice for this. She denies abdominal pain, no n/v     Assessment/Plan: Principal Problem:   DKA (diabetic ketoacidosis) ([place]) Active Problems:   Diabetes type 1, uncontrolled ([place])   Protein-calorie malnutrition, severe ([facility])   Diarrhea   Lower GI bleed    DKA with metabolic encephalopathy and  severe acidosis venous blood gas pH 6.8 on presentation:  -She re received insulin drip IV fluids, required ICU admission -Improving, off insulin drip  Poorly controlled type 1 diabetes Medication noncompliance Patient reports not able to afford insulin, she reports one half brand insulin make her feel bloated. Diabetes educator and case manager consulted.  Addendum 6:30 PM: Hypoglycemia event this pm due to inconsistent oral intake, decrease lantus from 15units to 8unit, continue ssi and hypoglycemia protocol  Rectal abscess  -CT imaging -Case discussed with general surgery Dr Hassell Done who recommended iv abx, he will see patient in consult.  Acute on chronic anemia Hemoglobin 6.4 on admission, status post PRBC transfusion on November 30 B12 folate pending  Chronic diarrhea//weight loss with anemia Report followed by GI Dr. Collene Mares Ct ab Carron Curie ordered GI consulted  Moisture associated skin damage around anal area  Likely from chronic diarrhea Wound care consulted  Hypokalemia  replace K check mag  Anxiety/ depression: Psych input appreciated,    Code Status: full  Family Communication: patient   Disposition Plan: *   Consultants:  GI  General surgery  Procedures:  none  Antibiotics:  Zosyn from 12/1   Objective: BP 98/61 (BP Location: Right Arm)    Pulse (!) 104   Temp 99.7 F (37.6 C) (Oral)   Resp 10   Ht 5' 7"  (1.702 m)   Wt 52.2 kg (115 lb)   SpO2 93%   BMI 18.01 kg/m   Intake/Output Summary (Last 24 hours) at 02/28/2017 0920 Last data filed at 02/28/2017 0600 Gross per 24 hour  Intake 3293.1 ml  Output 1500 ml  Net 1793.1 ml   Filed Weights   02/26/17 1708  Weight: 52.2 kg (115 lb)    Exam: Patient is examined daily including today on 02/28/2017, exams remain the same as of yesterday except that has changed    General:  NAD, labile mood  Cardiovascular: slight sinus tachycardia  Respiratory: CTABL  Abdomen: Soft/ND/NT, positive BS  Musculoskeletal: No Edema, perirectal skin irritation  Neuro: alert, oriented   Data Reviewed: Basic Metabolic Panel: Recent Labs  Lab 02/26/17 2011 02/27/17 0015 02/27/17 0355 02/27/17 1136 02/28/17 0700  NA 139 140 141  142 139 135  K 3.0* 3.1* 3.1*  3.1* 2.9* 3.2*  CL 116* 119* 120*  122* 117* 109  CO2 <7* 9* 15*  14* 17* 23  GLUCOSE 355* 203* 153*  153* 116* 141*  BUN 8 6 6  6  5* <5*  CREATININE 0.82 0.73 0.53  0.50 0.36* <0.30*  CALCIUM 7.9* 7.7* 7.6*  7.7* 7.5* 7.4*  MG 1.9  --   --   --   --   PHOS 2.9  --   --   --   --    Liver Function Tests: Recent Labs  Lab 02/26/17 1645  AST 13*  ALT  11*  ALKPHOS 181*  BILITOT 2.1*  PROT 7.8  ALBUMIN 2.2*   No results for input(s): LIPASE, AMYLASE in the last 168 hours. No results for input(s): AMMONIA in the last 168 hours. CBC: Recent Labs  Lab 02/27/17 0355 02/27/17 1200 02/27/17 1821 02/27/17 2348 02/28/17 0700  WBC 9.7 12.0* 9.5 10.0 8.6  NEUTROABS 7.4 9.4* 7.4 7.1 5.2  HGB 6.8* 8.7* 8.6* 9.0* 8.1*  HCT 19.9* 25.9* 25.4* 26.5* 23.8*  MCV 93.9 91.8 90.7 91.4 91.2  PLT 186 197 173 167 177   Cardiac Enzymes:   No results for input(s): CKTOTAL, CKMB, CKMBINDEX, TROPONINI in the last 168 hours. BNP (last 3 results) No results for input(s): BNP in the last 8760 hours.  ProBNP (last 3  results) No results for input(s): PROBNP in the last 8760 hours.  CBG: Recent Labs  Lab 02/27/17 1351 02/27/17 1455 02/27/17 1701 02/27/17 2226 02/28/17 0726  GLUCAP 111* 109* 127* 93 138*    Recent Results (from the past 240 hour(s))  Blood Culture (routine x 2)     Status: None (Preliminary result)   Collection Time: 02/26/17  4:45 PM  Result Value Ref Range Status   Specimen Description BLOOD RIGHT ANTECUBITAL  Final   Special Requests   Final    BOTTLES DRAWN AEROBIC AND ANAEROBIC Blood Culture adequate volume   Culture   Final    NO GROWTH < 24 HOURS Performed at [facility], [place] [address] [place], [place] [address]    Report Status PENDING  Incomplete  Blood Culture (routine x 2)     Status: None (Preliminary result)   Collection Time: 02/26/17  4:55 PM  Result Value Ref Range Status   Specimen Description BLOOD LEFT ANTECUBITAL  Final   Special Requests   Final    BOTTLES DRAWN AEROBIC AND ANAEROBIC Blood Culture adequate volume   Culture  Setup Time   Final    GRAM POSITIVE COCCI IN CHAINS ANAEROBIC BOTTLE ONLY CRITICAL RESULT CALLED TO, READ BACK BY AND VERIFIED WITH: B. GREEN PHARMD, AT 2333 02/27/17 BY D. VANHOOK Performed at [facility], [place] [address], [place], [place] [address]    Culture GRAM POSITIVE COCCI  Final   Report Status PENDING  Incomplete  Blood Culture ID Panel (Reflexed)     Status: None   Collection Time: 02/26/17  4:55 PM  Result Value Ref Range Status   Enterococcus species NOT DETECTED NOT DETECTED Final   Listeria monocytogenes NOT DETECTED NOT DETECTED Final   Staphylococcus species NOT DETECTED NOT DETECTED Final   Staphylococcus aureus NOT DETECTED NOT DETECTED Final   Streptococcus species NOT DETECTED NOT DETECTED Final   Streptococcus agalactiae NOT DETECTED NOT DETECTED Final   Streptococcus pneumoniae NOT DETECTED NOT DETECTED Final   Streptococcus pyogenes NOT DETECTED NOT DETECTED Final   Acinetobacter  baumannii NOT DETECTED NOT DETECTED Final   Enterobacteriaceae species NOT DETECTED NOT DETECTED Final   Enterobacter cloacae complex NOT DETECTED NOT DETECTED Final   Escherichia coli NOT DETECTED NOT DETECTED Final   Klebsiella oxytoca NOT DETECTED NOT DETECTED Final   Klebsiella pneumoniae NOT DETECTED NOT DETECTED Final   Proteus species NOT DETECTED NOT DETECTED Final   Serratia marcescens NOT DETECTED NOT DETECTED Final   Haemophilus influenzae NOT DETECTED NOT DETECTED Final   Neisseria meningitidis NOT DETECTED NOT DETECTED Final   Pseudomonas aeruginosa NOT DETECTED NOT DETECTED Final   Candida albicans NOT DETECTED NOT DETECTED Final  Candida glabrata NOT DETECTED NOT DETECTED Final   Candida krusei NOT DETECTED NOT DETECTED Final   Candida parapsilosis NOT DETECTED NOT DETECTED Final   Candida tropicalis NOT DETECTED NOT DETECTED Final    Comment: Performed at [facility], [place] [address], [place], [place] [address]  Urine culture     Status: None   Collection Time: 02/26/17 11:00 PM  Result Value Ref Range Status   Specimen Description URINE, RANDOM  Final   Special Requests NONE  Final   Culture   Final    NO GROWTH Performed at [facility], [place] [address] [place], [place] [address]    Report Status 02/28/2017 FINAL  Final  MRSA PCR Screening     Status: None   Collection Time: 02/26/17 11:00 PM  Result Value Ref Range Status   MRSA by PCR NEGATIVE NEGATIVE Final    Comment:        The GeneXpert MRSA Assay (FDA approved for NASAL specimens only), is one component of a comprehensive MRSA colonization surveillance program. It is not intended to diagnose MRSA infection nor to guide or monitor treatment for MRSA infections.      Studies: No results found.  Scheduled Meds: . insulin aspart  0-15 Units Subcutaneous TID WC  . insulin aspart  3 Units Subcutaneous TID WC  . insulin glargine  15 Units Subcutaneous Q24H    Continuous Infusions: .  sodium chloride Stopped (02/26/17 2311)  . dextrose 5 % and 0.45% NaCl 125 mL/hr at 02/28/17 0315  . insulin (NOVOLIN-R) infusion Stopped (02/27/17 1400)     Time spent: 41mns, case discussed with GI and general surgery I have personally reviewed and interpreted on  02/28/2017 daily labs, tele strips, imagings as discussed above under date review session and assessment and plans.  I reviewed all nursing notes, pharmacy notes, consultant notes,  vitals, pertinent old records  I have discussed plan of care as described above with RN , patient  on 02/28/2017   FFlorencia ReasonsMD, PhD  [facility] Pager [phone] If 7PM-7AM, please contact night-coverage at [email or web address], password [email or web address]12/03/2016, 9:20 AM  LOS: 2 days

## 2017-02-28 NOTE — Progress Notes (Signed)
Dr Daphine Deutscher called and updated with CT scan results. No new orders at this time. Will continue with IV antbx.

## 2017-02-28 NOTE — Consult Note (Signed)
Chief Complaint:  Fever and DKA--perirectal abscess  History of Present Illness:  [name] is an 26 y.o. female who was admitted in DKA --IDDM for several years.  Found by GI to have perirectal abscess that is draining.  I was asked to see and examined prior to her going for CT scan.  I had spoken with her physician about beginning antibiotics now.    Past Medical History:  Diagnosis Date  . Anxiety   . Diabetes type 1, uncontrolled ([place]) 11/14/2011   Since age 54  . Fibromyalgia   . Gastroparesis   . Hypertension   . Infection    UTI April 2016    Past Surgical History:  Procedure Laterality Date  . ANKLE SURGERY    . CHOLECYSTECTOMY  11/15/2011   Procedure: LAPAROSCOPIC CHOLECYSTECTOMY WITH INTRAOPERATIVE CHOLANGIOGRAM;  Surgeon: [name], MD;  Location: [facility];  Service: General;  Laterality: N/A;  . ESOPHAGOGASTRODUODENOSCOPY  12/03/2011   Procedure: ESOPHAGOGASTRODUODENOSCOPY (EGD);  Surgeon: Beryle Beams, MD;  Location: [facility];  Service: Endoscopy;  Laterality: N/A;  . LAPAROSCOPY  11/23/2011   Procedure: LAPAROSCOPY DIAGNOSTIC;  Surgeon: Edward Jolly, MD;  Location: [facility];  Service: General;  Laterality: N/A;    Current Facility-Administered Medications  Medication Dose Route Frequency Provider Last Rate Last Dose  . 0.9 %  sodium chloride infusion   Intravenous Continuous Karmen Bongo, MD   Stopped at 02/26/17 2311  . Influenza vac split quadrivalent PF (FLUARIX) injection 0.5 mL  0.5 mL Intramuscular Prior to discharge Aljishi, Estill Batten Z, MD      . insulin aspart (novoLOG) injection 0-15 Units  0-15 Units Subcutaneous TID WC Ollis, Brandi L, NP   2 Units at 02/28/17 1146  . insulin aspart (novoLOG) injection 3 Units  3 Units Subcutaneous TID WC Ollis, Brandi L, NP      . insulin glargine (LANTUS) injection 15 Units  15 Units Subcutaneous Q24H Raylene Miyamoto, MD   15 Units at 02/28/17 1204  . ondansetron (ZOFRAN) injection 4 mg  4 mg Intravenous  Q6H PRN Chesley Mires, MD   4 mg at 02/27/17 0744  . piperacillin-tazobactam (ZOSYN) IVPB 3.375 g  3.375 g Intravenous Q8H BellSharyn Lull T, RPH 12.5 mL/hr at 02/28/17 1242 3.375 g at 02/28/17 1242  . promethazine (PHENERGAN) injection 12.5-25 mg  12.5-25 mg Intravenous Q6H PRN Chesley Mires, MD   12.5 mg at 02/27/17 0537  . traMADol (ULTRAM) tablet 50 mg  50 mg Oral Q6H PRN Javier Glazier, MD   50 mg at 02/28/17 1146   Peanut-containing drug products and Lactose intolerance (gi) Family History  Problem Relation Age of Onset  . Diabetes Mother   . Hypertension Father    Social History:   reports that  has never smoked. she has never used smokeless tobacco. She reports that she drinks alcohol. She reports that she uses drugs. Drug: Marijuana.   REVIEW OF SYSTEMS : Negative except for see problem list  Physical Exam:   Blood pressure 98/61, pulse (!) 104, temperature 98.3 F (36.8 C), temperature source Oral, resp. rate 10, height _0  (1.702 m), weight 52.2 kg (115 lb), SpO2 93 %, currently breastfeeding. Body mass index is 18.01 kg/m.  Gen:  WDWN AAF with painful bottom  Neurological: Alert and oriented to person, place, and time. Motor and sensory function is grossly intact  Head: Normocephalic and atraumatic.  Eyes: Conjunctivae are normal. Pupils are equal, round, and reactive to light. No  scleral icterus.  Neck: Normal range of motion. Neck supple. No tracheal deviation or thyromegaly present.  GU:  Right anterior 5 mm hole with drainage of apparent perirectal abscess.  Some induration in the natal cleft.   Musculoskeletal: Normal range of motion. Extremities are nontender. No cyanosis, edema or clubbing noted Lymphadenopathy: No cervical, preauricular, postauricular or axillary adenopathy is present Skin: Skin is warm and dry. No rash noted. No diaphoresis. No erythema. No pallor. Pscyh: Normal mood and affect. Behavior is normal. Judgment and thought content normal.    LABORATORY RESULTS: Results for orders placed or performed during the hospital encounter of 02/26/17 (from the past 48 hour(s))  CBG monitoring, ED     Status: Abnormal   Collection Time: 02/26/17  4:18 PM  Result Value Ref Range   Glucose-Capillary 395 (H) 65 - 99 mg/dL   Comment 1 Notify RN   CBC with Differential/Platelet     Status: Abnormal   Collection Time: 02/26/17  4:45 PM  Result Value Ref Range   WBC 15.2 (H) 4.0 - 10.5 K/uL   RBC 2.97 (L) 3.87 - 5.11 MIL/uL   Hemoglobin 9.2 (L) 12.0 - 15.0 g/dL   HCT 29.5 (L) 36.0 - 46.0 %   MCV 99.3 78.0 - 100.0 fL   MCH 31.0 26.0 - 34.0 pg   MCHC 31.2 30.0 - 36.0 g/dL   RDW 14.5 11.5 - 15.5 %   Platelets 298 150 - 400 K/uL    Comment: REPEATED TO VERIFY SPECIMEN CHECKED FOR CLOTS PLATELET COUNT CONFIRMED BY SMEAR    Neutrophils Relative % 79 %   Lymphocytes Relative 7 %   Monocytes Relative 14 %   Eosinophils Relative 0 %   Basophils Relative 0 %   Neutro Abs 12.0 (H) 1.7 - 7.7 K/uL   Lymphs Abs 1.1 0.7 - 4.0 K/uL   Monocytes Absolute 2.1 (H) 0.1 - 1.0 K/uL   Eosinophils Absolute 0.0 0.0 - 0.7 K/uL   Basophils Absolute 0.0 0.0 - 0.1 K/uL   WBC Morphology WHITE COUNT CONFIRMED ON SMEAR     Comment: MILD LEFT SHIFT (1-5% METAS, OCC MYELO, OCC BANDS) TOXIC GRANULATION    Smear Review PLATELET COUNT CONFIRMED BY SMEAR   Comprehensive metabolic panel     Status: Abnormal   Collection Time: 02/26/17  4:45 PM  Result Value Ref Range   Sodium 138 135 - 145 mmol/L   Potassium 4.0 3.5 - 5.1 mmol/L   Chloride 110 101 - 111 mmol/L   CO2 <7 (L) 22 - 32 mmol/L    Comment: REPEATED TO VERIFY   Glucose, Bld 430 (H) 65 - 99 mg/dL   BUN 7 6 - 20 mg/dL   Creatinine, Ser 0.86 0.44 - 1.00 mg/dL   Calcium 8.7 (L) 8.9 - 10.3 mg/dL   Total Protein 7.8 6.5 - 8.1 g/dL   Albumin 2.2 (L) 3.5 - 5.0 g/dL   AST 13 (L) 15 - 41 U/L   ALT 11 (L) 14 - 54 U/L   Alkaline Phosphatase 181 (H) 38 - 126 U/L   Total Bilirubin 2.1 (H) 0.3 - 1.2 mg/dL    GFR calc non Af Amer >60 >60 mL/min   GFR calc Af Amer >60 >60 mL/min    Comment: (NOTE) The eGFR has been calculated using the CKD EPI equation. This calculation has not been validated in all clinical situations. eGFR's persistently <60 mL/min signify possible Chronic Kidney Disease.    Anion gap NOT CALCULATED 5 -  15  Blood Culture (routine x 2)     Status: None (Preliminary result)   Collection Time: 02/26/17  4:45 PM  Result Value Ref Range   Specimen Description BLOOD RIGHT ANTECUBITAL    Special Requests      BOTTLES DRAWN AEROBIC AND ANAEROBIC Blood Culture adequate volume   Culture      NO GROWTH 2 DAYS Performed at [facility], [place] [address] [place], [place] [address]    Report Status PENDING   Blood Culture (routine x 2)     Status: None (Preliminary result)   Collection Time: 02/26/17  4:55 PM  Result Value Ref Range   Specimen Description BLOOD LEFT ANTECUBITAL    Special Requests      BOTTLES DRAWN AEROBIC AND ANAEROBIC Blood Culture adequate volume   Culture  Setup Time      GRAM POSITIVE COCCI IN CHAINS ANAEROBIC BOTTLE ONLY CRITICAL RESULT CALLED TO, READ BACK BY AND VERIFIED WITH: B. GREEN PHARMD, AT 2333 02/27/17 BY D. VANHOOK Performed at [facility], [place] [address] [place], [place] [address]    Culture GRAM POSITIVE COCCI    Report Status PENDING   Blood Culture ID Panel (Reflexed)     Status: None   Collection Time: 02/26/17  4:55 PM  Result Value Ref Range   Enterococcus species NOT DETECTED NOT DETECTED   Listeria monocytogenes NOT DETECTED NOT DETECTED   Staphylococcus species NOT DETECTED NOT DETECTED   Staphylococcus aureus NOT DETECTED NOT DETECTED   Streptococcus species NOT DETECTED NOT DETECTED   Streptococcus agalactiae NOT DETECTED NOT DETECTED   Streptococcus pneumoniae NOT DETECTED NOT DETECTED   Streptococcus pyogenes NOT DETECTED NOT DETECTED   Acinetobacter baumannii NOT DETECTED NOT DETECTED   Enterobacteriaceae  species NOT DETECTED NOT DETECTED   Enterobacter cloacae complex NOT DETECTED NOT DETECTED   Escherichia coli NOT DETECTED NOT DETECTED   Klebsiella oxytoca NOT DETECTED NOT DETECTED   Klebsiella pneumoniae NOT DETECTED NOT DETECTED   Proteus species NOT DETECTED NOT DETECTED   Serratia marcescens NOT DETECTED NOT DETECTED   Haemophilus influenzae NOT DETECTED NOT DETECTED   Neisseria meningitidis NOT DETECTED NOT DETECTED   Pseudomonas aeruginosa NOT DETECTED NOT DETECTED   Candida albicans NOT DETECTED NOT DETECTED   Candida glabrata NOT DETECTED NOT DETECTED   Candida krusei NOT DETECTED NOT DETECTED   Candida parapsilosis NOT DETECTED NOT DETECTED   Candida tropicalis NOT DETECTED NOT DETECTED    Comment: Performed at [facility] [address] [place],  [address]  I-Stat Beta hCG blood, ED ([facility], [facility], [facility] only)     Status: None   Collection Time: 02/26/17  4:56 PM  Result Value Ref Range   I-stat hCG, quantitative <5.0 <5 mIU/mL   Comment 3            Comment:   GEST. AGE      CONC.  (mIU/mL)   <=1 WEEK        5 - 50     2 WEEKS       50 - 500     3 WEEKS       100 - 10,000     4 WEEKS     1,000 - 30,000        FEMALE AND NON-PREGNANT FEMALE:     LESS THAN 5 mIU/mL   I-stat chem 8, ed     Status: Abnormal   Collection Time: 02/26/17  4:58  PM  Result Value Ref Range   Sodium 139 135 - 145 mmol/L   Potassium 3.9 3.5 - 5.1 mmol/L   Chloride 114 (H) 101 - 111 mmol/L   BUN 4 (L) 6 - 20 mg/dL   Creatinine, Ser <0.20 (L) 0.44 - 1.00 mg/dL   Glucose, Bld 461 (H) 65 - 99 mg/dL   Calcium, Ion 1.34 1.15 - 1.40 mmol/L   TCO2 6 (L) 22 - 32 mmol/L   Hemoglobin 9.9 (L) 12.0 - 15.0 g/dL   HCT 29.0 (L) 36.0 - 46.0 %  I-Stat CG4 Lactic Acid, ED     Status: None   Collection Time: 02/26/17  4:59 PM  Result Value Ref Range   Lactic Acid, Venous 0.87 0.5 - 1.9 mmol/L  Blood gas, venous     Status: Abnormal   Collection Time: 02/26/17  5:32 PM  Result Value Ref Range   pH,  Ven 6.881 (LL) 7.250 - 7.430    Comment: CRITICAL RESULT CALLED TO, READ BACK BY AND VERIFIED WITH: DR. ZAMMIT AT 7829 BY DEE WALTERS RRT ON 02/26/17    pCO2, Ven 20.4 (L) 44.0 - 60.0 mmHg   pO2, Ven 83.7 (H) 32.0 - 45.0 mmHg   Bicarbonate 3.6 (L) 20.0 - 28.0 mmol/L   Acid-base deficit 28.8 (H) 0.0 - 2.0 mmol/L   O2 Saturation 89.8 %   Patient temperature 98.6    Collection site DRAWN BY RN    Sample type DRAWN BY RN   CBG monitoring, ED     Status: Abnormal   Collection Time: 02/26/17  5:44 PM  Result Value Ref Range   Glucose-Capillary 397 (H) 65 - 99 mg/dL  CBG monitoring, ED     Status: Abnormal   Collection Time: 02/26/17  6:57 PM  Result Value Ref Range   Glucose-Capillary 380 (H) 65 - 99 mg/dL  Magnesium     Status: None   Collection Time: 02/26/17  8:11 PM  Result Value Ref Range   Magnesium 1.9 1.7 - 2.4 mg/dL  Phosphorus     Status: None   Collection Time: 02/26/17  8:11 PM  Result Value Ref Range   Phosphorus 2.9 2.5 - 4.6 mg/dL  Basic metabolic panel     Status: Abnormal   Collection Time: 02/26/17  8:11 PM  Result Value Ref Range   Sodium 139 135 - 145 mmol/L   Potassium 3.0 (L) 3.5 - 5.1 mmol/L    Comment: DELTA CHECK NOTED   Chloride 116 (H) 101 - 111 mmol/L   CO2 <7 (L) 22 - 32 mmol/L   Glucose, Bld 355 (H) 65 - 99 mg/dL   BUN 8 6 - 20 mg/dL   Creatinine, Ser 0.82 0.44 - 1.00 mg/dL   Calcium 7.9 (L) 8.9 - 10.3 mg/dL   GFR calc non Af Amer >60 >60 mL/min   GFR calc Af Amer >60 >60 mL/min    Comment: (NOTE) The eGFR has been calculated using the CKD EPI equation. This calculation has not been validated in all clinical situations. eGFR's persistently <60 mL/min signify possible Chronic Kidney Disease.    Anion gap NOT CALCULATED 5 - 15  CBC     Status: Abnormal   Collection Time: 02/26/17  8:11 PM  Result Value Ref Range   WBC 14.0 (H) 4.0 - 10.5 K/uL   RBC 2.67 (L) 3.87 - 5.11 MIL/uL   Hemoglobin 8.3 (L) 12.0 - 15.0 g/dL   HCT 26.7 (L) 36.0 - 46.0  %  MCV 100.0 78.0 - 100.0 fL   MCH 31.1 26.0 - 34.0 pg   MCHC 31.1 30.0 - 36.0 g/dL   RDW 14.5 11.5 - 15.5 %   Platelets 222 150 - 400 K/uL  CBG monitoring, ED     Status: Abnormal   Collection Time: 02/26/17  8:12 PM  Result Value Ref Range   Glucose-Capillary 276 (H) 65 - 99 mg/dL  CBG monitoring, ED     Status: Abnormal   Collection Time: 02/26/17  9:37 PM  Result Value Ref Range   Glucose-Capillary 255 (H) 65 - 99 mg/dL  Glucose, capillary     Status: Abnormal   Collection Time: 02/26/17 10:47 PM  Result Value Ref Range   Glucose-Capillary 218 (H) 65 - 99 mg/dL   Comment 1 Notify RN    Comment 2 Document in Chart   Culture, blood (x 2)     Status: None (Preliminary result)   Collection Time: 02/26/17 11:00 PM  Result Value Ref Range   Specimen Description BLOOD LEFT WRIST    Special Requests      BOTTLES DRAWN AEROBIC AND ANAEROBIC Blood Culture adequate volume   Culture      NO GROWTH 1 DAY Performed at [facility], [place] [address], [place], [place] [address]    Report Status PENDING   Urine culture     Status: None   Collection Time: 02/26/17 11:00 PM  Result Value Ref Range   Specimen Description URINE, RANDOM    Special Requests NONE    Culture      NO GROWTH Performed at [facility], [place] [address] [place], [place] [address]    Report Status 02/28/2017 FINAL   Urine rapid drug screen (hosp performed)not at [facility]     Status: None   Collection Time: 02/26/17 11:00 PM  Result Value Ref Range   Opiates NONE DETECTED NONE DETECTED   Cocaine NONE DETECTED NONE DETECTED   Benzodiazepines NONE DETECTED NONE DETECTED   Amphetamines NONE DETECTED NONE DETECTED   Tetrahydrocannabinol NONE DETECTED NONE DETECTED   Barbiturates NONE DETECTED NONE DETECTED    Comment:        DRUG SCREEN FOR MEDICAL PURPOSES ONLY.  IF CONFIRMATION IS NEEDED FOR ANY PURPOSE, NOTIFY LAB WITHIN 5 DAYS.        LOWEST DETECTABLE LIMITS FOR URINE DRUG SCREEN Drug Class        Cutoff (ng/mL) Amphetamine      1000 Barbiturate      200 Benzodiazepine   742 Tricyclics       595 Opiates          300 Cocaine          300 THC              50   MRSA PCR Screening     Status: None   Collection Time: 02/26/17 11:00 PM  Result Value Ref Range   MRSA by PCR NEGATIVE NEGATIVE    Comment:        The GeneXpert MRSA Assay (FDA approved for NASAL specimens only), is one component of a comprehensive MRSA colonization surveillance program. It is not intended to diagnose MRSA infection nor to guide or monitor treatment for MRSA infections.   Culture, blood (x 2)     Status: None (Preliminary result)   Collection Time: 02/26/17 11:05 PM  Result Value Ref Range   Specimen Description BLOOD LEFT HAND    Special Requests IN PEDIATRIC  BOTTLE Blood Culture adequate volume    Culture      NO GROWTH 1 DAY Performed at [facility] [address], [place], [place] [address]    Report Status PENDING   Glucose, capillary     Status: Abnormal   Collection Time: 02/27/17 12:06 AM  Result Value Ref Range   Glucose-Capillary 153 (H) 65 - 99 mg/dL  CBC with Differential/Platelet     Status: Abnormal   Collection Time: 02/27/17 12:15 AM  Result Value Ref Range   WBC 11.6 (H) 4.0 - 10.5 K/uL   RBC 2.00 (L) 3.87 - 5.11 MIL/uL   Hemoglobin 6.4 (LL) 12.0 - 15.0 g/dL    Comment: DELTA CHECK NOTED REPEATED TO VERIFY CRITICAL RESULT CALLED TO, READ BACK BY AND VERIFIED WITH: RYAN,K RN 11.30.18 _0  ZANDO,C    HCT 19.1 (L) 36.0 - 46.0 %   MCV 95.5 78.0 - 100.0 fL   MCH 32.0 26.0 - 34.0 pg   MCHC 33.5 30.0 - 36.0 g/dL   RDW 14.4 11.5 - 15.5 %   Platelets 201 150 - 400 K/uL   Neutrophils Relative % 79 %   Lymphocytes Relative 9 %   Monocytes Relative 12 %   Eosinophils Relative 0 %   Basophils Relative 0 %   Neutro Abs 9.2 (H) 1.7 - 7.7 K/uL   Lymphs Abs 1.0 0.7 - 4.0 K/uL   Monocytes Absolute 1.4 (H) 0.1 - 1.0 K/uL   Eosinophils Absolute 0.0 0.0 - 0.7 K/uL    Basophils Absolute 0.0 0.0 - 0.1 K/uL   WBC Morphology MILD LEFT SHIFT (1-5% METAS, OCC MYELO, OCC BANDS)   Basic metabolic panel     Status: Abnormal   Collection Time: 02/27/17 12:15 AM  Result Value Ref Range   Sodium 140 135 - 145 mmol/L   Potassium 3.1 (L) 3.5 - 5.1 mmol/L   Chloride 119 (H) 101 - 111 mmol/L   CO2 9 (L) 22 - 32 mmol/L   Glucose, Bld 203 (H) 65 - 99 mg/dL   BUN 6 6 - 20 mg/dL   Creatinine, Ser 0.73 0.44 - 1.00 mg/dL   Calcium 7.7 (L) 8.9 - 10.3 mg/dL   GFR calc non Af Amer >60 >60 mL/min   GFR calc Af Amer >60 >60 mL/min    Comment: (NOTE) The eGFR has been calculated using the CKD EPI equation. This calculation has not been validated in all clinical situations. eGFR's persistently <60 mL/min signify possible Chronic Kidney Disease.    Anion gap 12 5 - 15  Lactic acid, plasma     Status: None   Collection Time: 02/27/17 12:15 AM  Result Value Ref Range   Lactic Acid, Venous 1.4 0.5 - 1.9 mmol/L  Procalcitonin     Status: None   Collection Time: 02/27/17 12:15 AM  Result Value Ref Range   Procalcitonin 0.20 ng/mL    Comment:        Interpretation: PCT (Procalcitonin) <= 0.5 ng/mL: Systemic infection (sepsis) is not likely. Local bacterial infection is possible. (NOTE)       Sepsis PCT Algorithm           Lower Respiratory Tract                                      Infection PCT Algorithm    ----------------------------     ----------------------------  PCT < 0.25 ng/mL                PCT < 0.10 ng/mL         Strongly encourage             Strongly discourage   discontinuation of antibiotics    initiation of antibiotics    ----------------------------     -----------------------------       PCT 0.25 - 0.50 ng/mL            PCT 0.10 - 0.25 ng/mL               OR       >80% decrease in PCT            Discourage initiation of                                            antibiotics      Encourage discontinuation           of antibiotics     ----------------------------     -----------------------------         PCT >= 0.50 ng/mL              PCT 0.26 - 0.50 ng/mL               AND        <80% decrease in PCT             Encourage initiation of                                             antibiotics       Encourage continuation           of antibiotics    ----------------------------     -----------------------------        PCT >= 0.50 ng/mL                  PCT > 0.50 ng/mL               AND         increase in PCT                  Strongly encourage                                      initiation of antibiotics    Strongly encourage escalation           of antibiotics                                     -----------------------------                                           PCT <= 0.25 ng/mL  OR                                        > 80% decrease in PCT                                     Discontinue / Do not initiate                                             antibiotics   Protime-INR     Status: Abnormal   Collection Time: 02/27/17 12:15 AM  Result Value Ref Range   Prothrombin Time 16.5 (H) 11.4 - 15.2 seconds   INR 1.34   APTT     Status: None   Collection Time: 02/27/17 12:15 AM  Result Value Ref Range   aPTT 33 24 - 36 seconds  Hemoglobin A1c     Status: Abnormal   Collection Time: 02/27/17 12:15 AM  Result Value Ref Range   Hgb A1c MFr Bld 12.2 (H) 4.8 - 5.6 %    Comment: (NOTE) Pre diabetes:          5.7%-6.4% Diabetes:              >6.4% Glycemic control for   <7.0% adults with diabetes    Mean Plasma Glucose 303.44 mg/dL    Comment: Performed at [facility], [place] [address], [place], [place] [address]  Glucose, capillary     Status: Abnormal   Collection Time: 02/27/17  1:08 AM  Result Value Ref Range   Glucose-Capillary 162 (H) 65 - 99 mg/dL  Prepare RBC     Status: None   Collection Time: 02/27/17  1:30 AM  Result Value Ref Range    Order Confirmation ORDER PROCESSED BY BLOOD BANK   Type and screen [place]     Status: None (Preliminary result)   Collection Time: 02/27/17  2:00 AM  Result Value Ref Range   ABO/RH(D) O POS    Antibody Screen NEG    Sample Expiration 03/02/2017    Unit Number [id number]    Blood Component Type RBC LR PHER1    Unit division 00    Status of Unit ISSUED,FINAL    Transfusion Status OK TO TRANSFUSE    Crossmatch Result Compatible    Unit Number [id number]    Blood Component Type RBC LR PHER1    Unit division 00    Status of Unit ALLOCATED    Transfusion Status OK TO TRANSFUSE    Crossmatch Result Compatible   ABO/Rh     Status: None   Collection Time: 02/27/17  2:00 AM  Result Value Ref Range   ABO/RH(D) O POS   Glucose, capillary     Status: Abnormal   Collection Time: 02/27/17  2:09 AM  Result Value Ref Range   Glucose-Capillary 148 (H) 65 - 99 mg/dL  Prepare RBC     Status: None   Collection Time: 02/27/17  3:00 AM  Result Value Ref Range   Order Confirmation ORDER PROCESSED BY BLOOD BANK   Glucose, capillary     Status: Abnormal   Collection Time: 02/27/17  3:16 AM  Result Value Ref  Range   Glucose-Capillary 140 (H) 65 - 99 mg/dL  CBC with Differential/Platelet     Status: Abnormal   Collection Time: 02/27/17  3:55 AM  Result Value Ref Range   WBC 9.7 4.0 - 10.5 K/uL   RBC 2.12 (L) 3.87 - 5.11 MIL/uL   Hemoglobin 6.8 (LL) 12.0 - 15.0 g/dL    Comment: CRITICAL VALUE NOTED.  VALUE IS CONSISTENT WITH PREVIOUSLY REPORTED AND CALLED VALUE.   HCT 19.9 (L) 36.0 - 46.0 %   MCV 93.9 78.0 - 100.0 fL   MCH 32.1 26.0 - 34.0 pg   MCHC 34.2 30.0 - 36.0 g/dL   RDW 14.3 11.5 - 15.5 %   Platelets 186 150 - 400 K/uL   Neutrophils Relative % 76 %   Neutro Abs 7.4 1.7 - 7.7 K/uL   Lymphocytes Relative 10 %   Lymphs Abs 1.0 0.7 - 4.0 K/uL   Monocytes Relative 14 %   Monocytes Absolute 1.3 (H) 0.1 - 1.0 K/uL   Eosinophils Relative 0 %   Eosinophils  Absolute 0.0 0.0 - 0.7 K/uL   Basophils Relative 0 %   Basophils Absolute 0.0 0.0 - 0.1 K/uL  Basic metabolic panel     Status: Abnormal   Collection Time: 02/27/17  3:55 AM  Result Value Ref Range   Sodium 142 135 - 145 mmol/L   Potassium 3.1 (L) 3.5 - 5.1 mmol/L   Chloride 122 (H) 101 - 111 mmol/L   CO2 14 (L) 22 - 32 mmol/L   Glucose, Bld 153 (H) 65 - 99 mg/dL   BUN 6 6 - 20 mg/dL   Creatinine, Ser 0.50 0.44 - 1.00 mg/dL   Calcium 7.7 (L) 8.9 - 10.3 mg/dL   GFR calc non Af Amer >60 >60 mL/min   GFR calc Af Amer >60 >60 mL/min    Comment: (NOTE) The eGFR has been calculated using the CKD EPI equation. This calculation has not been validated in all clinical situations. eGFR's persistently <60 mL/min signify possible Chronic Kidney Disease.    Anion gap 6 5 - 15  Basic metabolic panel     Status: Abnormal   Collection Time: 02/27/17  3:55 AM  Result Value Ref Range   Sodium 141 135 - 145 mmol/L   Potassium 3.1 (L) 3.5 - 5.1 mmol/L   Chloride 120 (H) 101 - 111 mmol/L   CO2 15 (L) 22 - 32 mmol/L   Glucose, Bld 153 (H) 65 - 99 mg/dL   BUN 6 6 - 20 mg/dL   Creatinine, Ser 0.53 0.44 - 1.00 mg/dL   Calcium 7.6 (L) 8.9 - 10.3 mg/dL   GFR calc non Af Amer >60 >60 mL/min   GFR calc Af Amer >60 >60 mL/min    Comment: (NOTE) The eGFR has been calculated using the CKD EPI equation. This calculation has not been validated in all clinical situations. eGFR's persistently <60 mL/min signify possible Chronic Kidney Disease.    Anion gap 6 5 - 15  Lactic acid, plasma     Status: None   Collection Time: 02/27/17  3:55 AM  Result Value Ref Range   Lactic Acid, Venous 1.4 0.5 - 1.9 mmol/L  HIV antibody     Status: None   Collection Time: 02/27/17  3:55 AM  Result Value Ref Range   HIV Screen 4th Generation wRfx Non Reactive Non Reactive    Comment: (NOTE) Performed At: [facility] [place], [place] [id number] Rush Farmer MD  [id number]   Glucose,  capillary     Status: Abnormal   Collection Time: 02/27/17  4:34 AM  Result Value Ref Range   Glucose-Capillary 142 (H) 65 - 99 mg/dL  Glucose, capillary     Status: Abnormal   Collection Time: 02/27/17  5:36 AM  Result Value Ref Range   Glucose-Capillary 133 (H) 65 - 99 mg/dL  Glucose, capillary     Status: Abnormal   Collection Time: 02/27/17  6:47 AM  Result Value Ref Range   Glucose-Capillary 140 (H) 65 - 99 mg/dL  Glucose, capillary     Status: Abnormal   Collection Time: 02/27/17  7:35 AM  Result Value Ref Range   Glucose-Capillary 136 (H) 65 - 99 mg/dL   Comment 1 Notify RN    Comment 2 Document in Chart   Glucose, capillary     Status: Abnormal   Collection Time: 02/27/17  7:52 AM  Result Value Ref Range   Glucose-Capillary 150 (H) 65 - 99 mg/dL  Glucose, capillary     Status: Abnormal   Collection Time: 02/27/17  9:12 AM  Result Value Ref Range   Glucose-Capillary 146 (H) 65 - 99 mg/dL   Comment 1 Notify RN    Comment 2 Document in Chart    Comment 3 Glucose Stabilizer   Glucose, capillary     Status: Abnormal   Collection Time: 02/27/17 10:30 AM  Result Value Ref Range   Glucose-Capillary 197 (H) 65 - 99 mg/dL  Basic metabolic panel     Status: Abnormal   Collection Time: 02/27/17 11:36 AM  Result Value Ref Range   Sodium 139 135 - 145 mmol/L   Potassium 2.9 (L) 3.5 - 5.1 mmol/L   Chloride 117 (H) 101 - 111 mmol/L   CO2 17 (L) 22 - 32 mmol/L   Glucose, Bld 116 (H) 65 - 99 mg/dL   BUN 5 (L) 6 - 20 mg/dL   Creatinine, Ser 0.36 (L) 0.44 - 1.00 mg/dL   Calcium 7.5 (L) 8.9 - 10.3 mg/dL   GFR calc non Af Amer >60 >60 mL/min   GFR calc Af Amer >60 >60 mL/min    Comment: (NOTE) The eGFR has been calculated using the CKD EPI equation. This calculation has not been validated in all clinical situations. eGFR's persistently <60 mL/min signify possible Chronic Kidney Disease.    Anion gap 5 5 - 15  Glucose, capillary     Status: None   Collection Time: 02/27/17  11:50 AM  Result Value Ref Range   Glucose-Capillary 96 65 - 99 mg/dL  CBC with Differential/Platelet     Status: Abnormal   Collection Time: 02/27/17 12:00 PM  Result Value Ref Range   WBC 12.0 (H) 4.0 - 10.5 K/uL   RBC 2.82 (L) 3.87 - 5.11 MIL/uL   Hemoglobin 8.7 (L) 12.0 - 15.0 g/dL    Comment: DELTA CHECK NOTED POST TRANSFUSION SPECIMEN    HCT 25.9 (L) 36.0 - 46.0 %   MCV 91.8 78.0 - 100.0 fL   MCH 30.9 26.0 - 34.0 pg   MCHC 33.6 30.0 - 36.0 g/dL   RDW 14.8 11.5 - 15.5 %   Platelets 197 150 - 400 K/uL   Neutrophils Relative % 78 %   Neutro Abs 9.4 (H) 1.7 - 7.7 K/uL   Lymphocytes Relative 6 %   Lymphs Abs 0.7 0.7 - 4.0 K/uL   Monocytes Relative 16 %   Monocytes Absolute 1.9 (H) 0.1 - 1.0 K/uL  Eosinophils Relative 0 %   Eosinophils Absolute 0.0 0.0 - 0.7 K/uL   Basophils Relative 0 %   Basophils Absolute 0.0 0.0 - 0.1 K/uL  Glucose, capillary     Status: Abnormal   Collection Time: 02/27/17 12:47 PM  Result Value Ref Range   Glucose-Capillary 100 (H) 65 - 99 mg/dL  Glucose, capillary     Status: Abnormal   Collection Time: 02/27/17  1:51 PM  Result Value Ref Range   Glucose-Capillary 111 (H) 65 - 99 mg/dL   Comment 1 Notify RN    Comment 2 Document in Chart   Glucose, capillary     Status: Abnormal   Collection Time: 02/27/17  2:55 PM  Result Value Ref Range   Glucose-Capillary 109 (H) 65 - 99 mg/dL  Glucose, capillary     Status: Abnormal   Collection Time: 02/27/17  5:01 PM  Result Value Ref Range   Glucose-Capillary 127 (H) 65 - 99 mg/dL   Comment 1 Notify RN    Comment 2 Document in Chart   CBC with Differential/Platelet     Status: Abnormal   Collection Time: 02/27/17  6:21 PM  Result Value Ref Range   WBC 9.5 4.0 - 10.5 K/uL   RBC 2.80 (L) 3.87 - 5.11 MIL/uL   Hemoglobin 8.6 (L) 12.0 - 15.0 g/dL   HCT 25.4 (L) 36.0 - 46.0 %   MCV 90.7 78.0 - 100.0 fL   MCH 30.7 26.0 - 34.0 pg   MCHC 33.9 30.0 - 36.0 g/dL   RDW 14.9 11.5 - 15.5 %   Platelets 173  150 - 400 K/uL   Neutrophils Relative % 77 %   Neutro Abs 7.4 1.7 - 7.7 K/uL   Lymphocytes Relative 9 %   Lymphs Abs 0.8 0.7 - 4.0 K/uL   Monocytes Relative 14 %   Monocytes Absolute 1.3 (H) 0.1 - 1.0 K/uL   Eosinophils Relative 0 %   Eosinophils Absolute 0.0 0.0 - 0.7 K/uL   Basophils Relative 0 %   Basophils Absolute 0.0 0.0 - 0.1 K/uL  Glucose, capillary     Status: None   Collection Time: 02/27/17 10:26 PM  Result Value Ref Range   Glucose-Capillary 93 65 - 99 mg/dL   Comment 1 Notify RN    Comment 2 Document in Chart   CBC with Differential/Platelet     Status: Abnormal   Collection Time: 02/27/17 11:48 PM  Result Value Ref Range   WBC 10.0 4.0 - 10.5 K/uL   RBC 2.90 (L) 3.87 - 5.11 MIL/uL   Hemoglobin 9.0 (L) 12.0 - 15.0 g/dL   HCT 26.5 (L) 36.0 - 46.0 %   MCV 91.4 78.0 - 100.0 fL   MCH 31.0 26.0 - 34.0 pg   MCHC 34.0 30.0 - 36.0 g/dL   RDW 15.3 11.5 - 15.5 %   Platelets 167 150 - 400 K/uL   Neutrophils Relative % 71 %   Neutro Abs 7.1 1.7 - 7.7 K/uL   Lymphocytes Relative 12 %   Lymphs Abs 1.2 0.7 - 4.0 K/uL   Monocytes Relative 17 %   Monocytes Absolute 1.7 (H) 0.1 - 1.0 K/uL   Eosinophils Relative 0 %   Eosinophils Absolute 0.0 0.0 - 0.7 K/uL   Basophils Relative 0 %   Basophils Absolute 0.0 0.0 - 0.1 K/uL  Procalcitonin - Baseline     Status: None   Collection Time: 02/28/17  1:00 AM  Result Value Ref Range  Procalcitonin 0.16 ng/mL    Comment:        Interpretation: PCT (Procalcitonin) <= 0.5 ng/mL: Systemic infection (sepsis) is not likely. Local bacterial infection is possible. (NOTE)       Sepsis PCT Algorithm           Lower Respiratory Tract                                      Infection PCT Algorithm    ----------------------------     ----------------------------         PCT < 0.25 ng/mL                PCT < 0.10 ng/mL         Strongly encourage             Strongly discourage   discontinuation of antibiotics    initiation of antibiotics     ----------------------------     -----------------------------       PCT 0.25 - 0.50 ng/mL            PCT 0.10 - 0.25 ng/mL               OR       >80% decrease in PCT            Discourage initiation of                                            antibiotics      Encourage discontinuation           of antibiotics    ----------------------------     -----------------------------         PCT >= 0.50 ng/mL              PCT 0.26 - 0.50 ng/mL               AND        <80% decrease in PCT             Encourage initiation of                                             antibiotics       Encourage continuation           of antibiotics    ----------------------------     -----------------------------        PCT >= 0.50 ng/mL                  PCT > 0.50 ng/mL               AND         increase in PCT                  Strongly encourage                                      initiation of antibiotics    Strongly encourage escalation           of antibiotics                                     -----------------------------  PCT <= 0.25 ng/mL                                                 OR                                        > 80% decrease in PCT                                     Discontinue / Do not initiate                                             antibiotics   Ferritin     Status: None   Collection Time: 02/28/17  7:00 AM  Result Value Ref Range   Ferritin 272 11 - 307 ng/mL    Comment: Performed at [facility], [place] [address], [place], [place] [address]  Reticulocytes     Status: Abnormal   Collection Time: 02/28/17  7:00 AM  Result Value Ref Range   Retic Ct Pct 3.5 (H) 0.4 - 3.1 %   RBC. 2.57 (L) 3.87 - 5.11 MIL/uL   Retic Count, Absolute 90.0 19.0 - 186.0 K/uL  Iron and TIBC     Status: Abnormal   Collection Time: 02/28/17  7:00 AM  Result Value Ref Range   Iron 20 (L) 28 - 170 ug/dL   TIBC 108 (L) 250 - 450 ug/dL    Saturation Ratios 19 10.4 - 31.8 %   UIBC 88 ug/dL    Comment: Performed at [facility], [place] [address] [place], [place] [address]  Basic metabolic panel     Status: Abnormal   Collection Time: 02/28/17  7:00 AM  Result Value Ref Range   Sodium 135 135 - 145 mmol/L   Potassium 3.2 (L) 3.5 - 5.1 mmol/L   Chloride 109 101 - 111 mmol/L   CO2 23 22 - 32 mmol/L   Glucose, Bld 141 (H) 65 - 99 mg/dL   BUN <5 (L) 6 - 20 mg/dL   Creatinine, Ser <0.30 (L) 0.44 - 1.00 mg/dL   Calcium 7.4 (L) 8.9 - 10.3 mg/dL   GFR calc non Af Amer NOT CALCULATED >60 mL/min   GFR calc Af Amer NOT CALCULATED >60 mL/min    Comment: (NOTE) The eGFR has been calculated using the CKD EPI equation. This calculation has not been validated in all clinical situations. eGFR's persistently <60 mL/min signify possible Chronic Kidney Disease.    Anion gap 3 (L) 5 - 15  CBC with Differential/Platelet     Status: Abnormal   Collection Time: 02/28/17  7:00 AM  Result Value Ref Range   WBC 8.6 4.0 - 10.5 K/uL   RBC 2.61 (L) 3.87 - 5.11 MIL/uL   Hemoglobin 8.1 (L) 12.0 - 15.0 g/dL   HCT 23.8 (L) 36.0 - 46.0 %   MCV 91.2 78.0 - 100.0 fL   MCH 31.0 26.0 - 34.0 pg   MCHC 34.0 30.0 - 36.0 g/dL   RDW 15.5 11.5 -  15.5 %   Platelets 177 150 - 400 K/uL   Neutrophils Relative % 60 %   Neutro Abs 5.2 1.7 - 7.7 K/uL   Lymphocytes Relative 19 %   Lymphs Abs 1.6 0.7 - 4.0 K/uL   Monocytes Relative 20 %   Monocytes Absolute 1.8 (H) 0.1 - 1.0 K/uL   Eosinophils Relative 1 %   Eosinophils Absolute 0.1 0.0 - 0.7 K/uL   Basophils Relative 0 %   Basophils Absolute 0.0 0.0 - 0.1 K/uL  Glucose, capillary     Status: Abnormal   Collection Time: 02/28/17  7:26 AM  Result Value Ref Range   Glucose-Capillary 138 (H) 65 - 99 mg/dL   Comment 1 Notify RN    Comment 2 Document in Chart   Glucose, capillary     Status: Abnormal   Collection Time: 02/28/17 11:08 AM  Result Value Ref Range   Glucose-Capillary 161 (H) 65 - 99 mg/dL    Comment 1 Notify RN    Comment 2 Document in Chart      RADIOLOGY RESULTS: Dg Chest Port 1 View  Result Date: 02/26/2017 CLINICAL DATA:  25 year old female with weakness and fatigue. EXAM: PORTABLE CHEST 1 VIEW COMPARISON:  12/14/2016 FINDINGS: The heart size and mediastinal contours are within normal limits. No focal parenchymal opacity or sizable pleural effusion. Small pneumothorax difficult to definitively exclude on supine only radiographs. The visualized skeletal structures are unremarkable. IMPRESSION: No active disease. Electronically Signed   By: Kristopher Oppenheim M.D.   On: 02/26/2017 17:30    Problem List: Patient Active Problem List   Diagnosis Date Noted  . MDD (major depressive disorder), recurrent episode, mild ([place]) 02/28/2017  . Diarrhea 02/27/2017  . Lower GI bleed 02/27/2017  . DKA (diabetic ketoacidosis) ([place]) 02/26/2017  . Acute metabolic encephalopathy   . DKA, type 1 ([place]) 06/11/2013  . Orthostatic hypotension dysautonomic syndrome ([place]) 04/28/2013  . Body aches 04/28/2013  . Orthostatic hypotension 04/28/2013  . Diabetes mellitus type 1 ([place]) 04/28/2013  . Protein-calorie malnutrition, severe ([place]) 04/28/2013  . Noncompliance with diabetes treatment 09/01/2012  . Dyspnea 02/28/2012  . Sinus tachycardia 02/28/2012  . Hyperglycemia without ketosis 02/28/2012  . Chest pain 12/04/2011  . Diabetes type 1, uncontrolled ([place]) 11/14/2011  . Hypertension associated with diabetes ([place]) 11/14/2011  . Gastroparesis 11/14/2011  . Hyponatremia 09/19/2011  . Abdominal pain 09/18/2011  . Metabolic alkalosis 44/03/270  . Diabetic hyperosmolar non-ketotic state ([place]) 09/18/2011  . DIAB W/UNSPEC COMP TYPE I [JUV TYPE] UNCNTRL 06/04/2010  . CONSTIPATION 01/01/2010  . RECTAL PAIN 01/01/2010    Assessment & Plan: Perirectal abscess-spontaneously drained.  CT pending.  I suspect that this will show some air in the subcutaneous tissue from this hole and the underlying  drained areas.  Would continue IV antibiotics and observation at present.      Matt B. Hassell Done, MD, [facility] [phone] beeper [phone]  02/28/2017 1:12 PM

## 2017-02-28 NOTE — Consult Note (Signed)
GI Consult                                                        covering for Dr. Collene Mares  Referring Provider: [facility]  Primary Care Physician:  [name], MD Primary Gastroenterologist:  Dr. Juanita Craver   Attending physician's note   I have taken a history, examined the patient and reviewed the chart. I agree with the Advanced Practitioner's note, impression and recommendations.  Worsening anemia, chronic diarrhea, weight loss in settling of poorly controlled DM and current admission for DKA. Small amount of bleeding in perianal area noted by RN.  * Perirectal abscess - IV antibiotics and surgical consult now  * Minor perianal bleeding noted on exam. No blood on DRE. Related to abscess, perianal irritation  * Normocytic anemia - send iron studies, B12, folate * Weight loss most likely due to poorly controlled DM * Diarrhea - review outpatient evaluation performed by Dr. Collene Mares when office opens on Monday  Drs. Collene Mares and Benson [name] to resume care on Monday   Lucio Edward, MD [facility] [phone] Mon-Fri 8a-5p [phone] after 5p, weekends, holidays    Reason for Consultation:  Anemia, rectal bleeding.   ASSESSMENT AND PLAN:   1. 26 yo female with normocytic anemia with 3-4 gram drop in hgb since Sept. Reports heavy menses but not new and hasn't had a cycle in two months. No overt GI bleeding at home but RN saw blood dripping from anus. I didn't find blood on DRE though she had minor oozing from perianal excoriations and a right perianal abscess  -will need further evaluation when acute issues resolved. Hgb is stable now.     2. Chronic diarrhea present for at least one year. Followed by Dr. Collene Mares as outpatient but no endoscopic workup done per patient. She reports associated weight loss > 20 pounds over last several months.  With the associated weight loss IBD not excluded. Also with IDDM she could have diabetic enteropathy  or SIBO though doesn't fully explain all the weight loss. Diarrhea could be functional in part.  3. Right perianal abscess. I discussed with Hospitalist who will call for Surgical consultation.  4. DKA.    HPI: [name] is a 26 y.o. female IDDM and diabetic gastroparesis based on EGD by Dr. Benson [name] in 2013. Patient is known to Dr. Collene Mares. She was admitted a few days ago with severe DKA, pH < 7. Admitted from ED to ICU by CCM, Hospitalist has picked up care as of today. Patient has been having loose stool at home for over a year, possibly worse in last 6 months. On average has 4 loose non-bloody BMs a day, including nocturnal stools. She as seen Dr. Collene Mares a couple of times, doesn't sound like stool studies or endoscopic workup done. She was tried on anti-diarrheals but says they didn't help. She reports > 20 pound weight loss over last several months. She doesn't have any abdominal pain.   I spoke with RN, blood found in bed. Patient has a sore on buttocks from moisture and blood though to be coming from that but after cleaning patient up there was blood noted to be dripping from rectum.  Patient's hgb has certainly declined over last few months.  Hgb in Sept was 12, down to 9.2 on admission and  then 6.4 next day. Received a unit of blood, hgb 8.1 today. Normal MCV. She does tend to have heavy periods but this is not new and she hasn't menstruated in a couple of months.   Psychiatry has evaluated her this admission for anxiety / depression. She has apparently not been compliant with insulin.   Past Medical History:  Diagnosis Date  . Anxiety   . Diabetes type 1, uncontrolled ([place]) 11/14/2011   Since age 66  . Fibromyalgia   . Gastroparesis   . Hypertension   . Infection    UTI April 2016    Past Surgical History:  Procedure Laterality Date  . ANKLE SURGERY    . CHOLECYSTECTOMY  11/15/2011   Procedure: LAPAROSCOPIC CHOLECYSTECTOMY WITH INTRAOPERATIVE CHOLANGIOGRAM;  Surgeon: [name], MD;  Location: [facility];  Service: General;  Laterality: N/A;  . ESOPHAGOGASTRODUODENOSCOPY  12/03/2011   Procedure: ESOPHAGOGASTRODUODENOSCOPY (EGD);  Surgeon: Beryle Beams, MD;  Location: [facility];  Service: Endoscopy;  Laterality: N/A;  . LAPAROSCOPY  11/23/2011   Procedure: LAPAROSCOPY DIAGNOSTIC;  Surgeon: Edward Jolly, MD;  Location: [facility];  Service: General;  Laterality: N/A;    Prior to Admission medications   Medication Sig Start Date End Date Taking? Authorizing Provider  Ibuprofen (MIDOL) 200 MG CAPS Take 400 mg by mouth daily as needed (pain).   Yes [provider]  insulin glargine (LANTUS) 100 UNIT/ML injection Inject 16 Units into the skin 2 (two) times daily. Sometimes has to adjust based on BG levels   Yes [provider]  Loperamide HCl (IMODIUM A-D) 1 MG/7.5ML LIQD Take 1 mg by mouth daily as needed (diarrhea).   Yes [provider]  albuterol (PROVENTIL HFA;VENTOLIN HFA) 108 (90 Base) MCG/ACT inhaler Inhale 1-2 puffs into the lungs every 4 (four) hours as needed for wheezing or shortness of breath. 09/02/16   Street, Mercedes, PA-C  EPINEPHrine (EPIPEN 2-PAK) 0.3 mg/0.3 mL IJ SOAJ injection Inject 0.3 mLs (0.3 mg total) into the muscle once. Patient taking differently: Inject 0.3 mg into the muscle as needed (allergic reaction).  08/10/14   Denney, Rachelle A, CNM  glucose 4 GM chewable tablet Chew 1 tablet (4 g total) by mouth as needed for low blood sugar. 08/10/14   Kandis Cocking A, CNM  insulin lispro (HUMALOG) 100 UNIT/ML injection Inject 8 Units into the skin 3 (three) times daily as needed for high blood sugar. Patient uses sliding scale.  Always uses 8 units but could be more.    [provider]  ketotifen (ZADITOR) 0.025 % ophthalmic solution Place 1 drop into both eyes 2 (two) times daily as needed (dry eyes).    [provider]  metoCLOPramide (REGLAN) 10 MG tablet Take 1 tablet (10 mg total) by mouth every 6  (six) hours as needed for nausea or vomiting. 05/13/16   Ward, Ozella Almond, PA-C    Current Facility-Administered Medications  Medication Dose Route Frequency Provider Last Rate Last Dose  . 0.9 %  sodium chloride infusion   Intravenous Continuous Karmen Bongo, MD   Stopped at 02/26/17 2311  . Influenza vac split quadrivalent PF (FLUARIX) injection 0.5 mL  0.5 mL Intramuscular Prior to discharge Aljishi, Virgina Norfolk, MD      . insulin aspart (novoLOG) injection 0-15 Units  0-15 Units Subcutaneous TID WC Ollis, Brandi L, NP   2 Units at 02/28/17 0801  . insulin aspart (novoLOG) injection 3 Units  3 Units Subcutaneous TID WC Ollis, Brandi L,  NP      . insulin glargine (LANTUS) injection 15 Units  15 Units Subcutaneous Q24H Raylene Miyamoto, MD   15 Units at 02/27/17 1248  . ondansetron (ZOFRAN) injection 4 mg  4 mg Intravenous Q6H PRN Chesley Mires, MD   4 mg at 02/27/17 0744  . potassium chloride 20 MEQ/15ML (10%) solution 40 mEq  40 mEq Oral Once Florencia Reasons, MD      . promethazine (PHENERGAN) injection 12.5-25 mg  12.5-25 mg Intravenous Q6H PRN Chesley Mires, MD   12.5 mg at 02/27/17 0537  . traMADol (ULTRAM) tablet 50 mg  50 mg Oral Q6H PRN Javier Glazier, MD   50 mg at 02/28/17 4961    Allergies as of 02/26/2017 - Review Complete 02/26/2017  Allergen Reaction Noted  . Peanut-containing drug products Anaphylaxis and Other (See Comments) 09/01/2012  . Lactose intolerance (gi) Other (See Comments) 01/22/2015    Family History  Problem Relation Age of Onset  . Diabetes Mother   . Hypertension Father     Social History   Socioeconomic History  . Marital status: Married    Spouse name: sergio  . Number of children: 0  . Years of education: college  . Highest education level: Not on file  Social Needs  . Financial resource strain: Not on file  . Food insecurity - worry: Not on file  . Food insecurity - inability: Not on file  . Transportation needs - medical: Not on file  .  Transportation needs - non-medical: Not on file  Occupational History  . Occupation: [employer] call center    Employer: [employer]  Tobacco Use  . Smoking status: Never Smoker  . Smokeless tobacco: Never Used  Substance and Sexual Activity  . Alcohol use: Yes    Alcohol/week: 0.0 oz    Comment: rare   . Drug use: Yes    Types: Marijuana    Comment: uncertain when last use was  . Sexual activity: Not Currently    Partners: Male    Birth control/protection: None  Other Topics Concern  . Not on file  Social History Narrative  . Not on file    Review of Systems: All systems reviewed and negative except where noted in HPI.  Physical Exam: Vital signs in last 24 hours: Temp:  [99.1 F (37.3 C)-101.7 F (38.7 C)] 99.7 F (37.6 C) (12/01 0715) Pulse Rate:  [98-116] 104 (12/01 0600) Resp:  [7-21] 10 (12/01 0600) BP: (82-127)/(45-93) 98/61 (12/01 0600) SpO2:  [92 %-98 %] 93 % (12/01 0600) Last BM Date: 02/26/17 General:   Alert, thin black female in NAD Psych:  Pleasant, cooperative. Flat affect Eyes:  Pupils equal, sclera clear, no icterus.   Conjunctiva pink. Ears:  Normal auditory acuity. Nose:  No deformity, discharge,  or lesions. Neck:  Supple; no masses Lungs:  Clear throughout to auscultation.   No wheezes, crackles, or rhonchi.  Heart:  Regular rate and rhythm; no murmurs, no edema Abdomen:  Soft, non-distended, nontender, BS active, no palp mass    Rectal:  Right perirectal abscess. Indurated, tender, oozed purulent material when palpated. Minor amount of blood noted in perianal area. No blood on DRE.   Msk:  Symmetrical without gross deformities. . Pulses:  Normal pulses noted. Neurologic:  Alert and  oriented x4;  grossly normal neurologically. Skin:  Intact without significant lesions or rashes..   Intake/Output from previous day: 11/30 0701 - 12/01 0700 In: 3608.1 [P.O.:720; I.V.:2573.1; Blood:315] Out: 1700 [  [id number]] Intake/Output this  shift: No intake/output data recorded.  Lab Results: Recent Labs    02/27/17 1821 02/27/17 2348 02/28/17 0700  WBC 9.5 10.0 8.6  HGB 8.6* 9.0* 8.1*  HCT 25.4* 26.5* 23.8*  PLT 173 167 177   BMET Recent Labs    02/27/17 0355 02/27/17 1136 02/28/17 0700  NA 141  142 139 135  K 3.1*  3.1* 2.9* 3.2*  CL 120*  122* 117* 109  CO2 15*  14* 17* 23  GLUCOSE 153*  153* 116* 141*  BUN 6  6 5* <5*  CREATININE 0.53  0.50 0.36* <0.30*  CALCIUM 7.6*  7.7* 7.5* 7.4*   LFT Recent Labs    02/26/17 1645  PROT 7.8  ALBUMIN 2.2*  AST 13*  ALT 11*  ALKPHOS 181*  BILITOT 2.1*   PT/INR Recent Labs    02/27/17 0015  LABPROT 16.5*  INR 1.34    Studies/Results: Dg Chest Port 1 View  Result Date: 02/26/2017 CLINICAL DATA:  26 year old female with weakness and fatigue. EXAM: PORTABLE CHEST 1 VIEW COMPARISON:  12/14/2016 FINDINGS: The heart size and mediastinal contours are within normal limits. No focal parenchymal opacity or sizable pleural effusion. Small pneumothorax difficult to definitively exclude on supine only radiographs. The visualized skeletal structures are unremarkable. IMPRESSION: No active disease. Electronically Signed   By: Kristopher Oppenheim M.D.   On: 02/26/2017 17:30    Tye Savoy, NP-C @  02/28/2017, 11:03 AM  Pager number [phone]

## 2017-02-28 NOTE — Progress Notes (Signed)
PHARMACY - PHYSICIAN COMMUNICATION CRITICAL VALUE ALERT - BLOOD CULTURE IDENTIFICATION (BCID)  [name] is an 26 y.o. female who presented to [facility] on 02/26/2017 with a chief complaint of severe DK  Name of physician (or Provider) Contacted: Dr Jamison Neighbor  Current antibiotics: none  Changes to prescribed antibiotics recommended:  Provider will f/u  Results for orders placed or performed during the hospital encounter of 02/26/17  Blood Culture ID Panel (Reflexed) (Collected: 02/26/2017  4:55 PM)  Result Value Ref Range   Enterococcus species NOT DETECTED NOT DETECTED   Listeria monocytogenes NOT DETECTED NOT DETECTED   Staphylococcus species NOT DETECTED NOT DETECTED   Staphylococcus aureus NOT DETECTED NOT DETECTED   Streptococcus species NOT DETECTED NOT DETECTED   Streptococcus agalactiae NOT DETECTED NOT DETECTED   Streptococcus pneumoniae NOT DETECTED NOT DETECTED   Streptococcus pyogenes NOT DETECTED NOT DETECTED   Acinetobacter baumannii NOT DETECTED NOT DETECTED   Enterobacteriaceae species NOT DETECTED NOT DETECTED   Enterobacter cloacae complex NOT DETECTED NOT DETECTED   Escherichia coli NOT DETECTED NOT DETECTED   Klebsiella oxytoca NOT DETECTED NOT DETECTED   Klebsiella pneumoniae NOT DETECTED NOT DETECTED   Proteus species NOT DETECTED NOT DETECTED   Serratia marcescens NOT DETECTED NOT DETECTED   Haemophilus influenzae NOT DETECTED NOT DETECTED   Neisseria meningitidis NOT DETECTED NOT DETECTED   Pseudomonas aeruginosa NOT DETECTED NOT DETECTED   Candida albicans NOT DETECTED NOT DETECTED   Candida glabrata NOT DETECTED NOT DETECTED   Candida krusei NOT DETECTED NOT DETECTED   Candida parapsilosis NOT DETECTED NOT DETECTED   Candida tropicalis NOT DETECTED NOT DETECTED    Lorenza Evangelist 02/28/2017  12:02 AM

## 2017-02-28 NOTE — Progress Notes (Signed)
PHARMACY - PHYSICIAN COMMUNICATION CRITICAL VALUE ALERT - BLOOD CULTURE IDENTIFICATION (BCID)  [name] is an 26 y.o. female who presented to [facility] on 02/26/2017 with AMS, DKA.  Assessment:  Found to have peri-rectal abscess. Previously called blood culture as gram positive cocci in chains corrected to gram positive rods --> Lactobacillus species per micro lab.   Name of physician (or Provider) Contacted: Dr. Roda Shutters  Current antibiotics: Zosyn  Changes to prescribed antibiotics recommended:  Response not received from provider;  current antibiotics are likely to cover the isolated organism.  F/u final cultures.   Results for orders placed or performed during the hospital encounter of 02/26/17  Blood Culture ID Panel (Reflexed) (Collected: 02/26/2017  4:55 PM)  Result Value Ref Range   Enterococcus species NOT DETECTED NOT DETECTED   Listeria monocytogenes NOT DETECTED NOT DETECTED   Staphylococcus species NOT DETECTED NOT DETECTED   Staphylococcus aureus NOT DETECTED NOT DETECTED   Streptococcus species NOT DETECTED NOT DETECTED   Streptococcus agalactiae NOT DETECTED NOT DETECTED   Streptococcus pneumoniae NOT DETECTED NOT DETECTED   Streptococcus pyogenes NOT DETECTED NOT DETECTED   Acinetobacter baumannii NOT DETECTED NOT DETECTED   Enterobacteriaceae species NOT DETECTED NOT DETECTED   Enterobacter cloacae complex NOT DETECTED NOT DETECTED   Escherichia coli NOT DETECTED NOT DETECTED   Klebsiella oxytoca NOT DETECTED NOT DETECTED   Klebsiella pneumoniae NOT DETECTED NOT DETECTED   Proteus species NOT DETECTED NOT DETECTED   Serratia marcescens NOT DETECTED NOT DETECTED   Haemophilus influenzae NOT DETECTED NOT DETECTED   Neisseria meningitidis NOT DETECTED NOT DETECTED   Pseudomonas aeruginosa NOT DETECTED NOT DETECTED   Candida albicans NOT DETECTED NOT DETECTED   Candida glabrata NOT DETECTED NOT DETECTED   Candida krusei NOT DETECTED NOT DETECTED   Candida  parapsilosis NOT DETECTED NOT DETECTED   Candida tropicalis NOT DETECTED NOT DETECTED    Jamse Mead 02/28/2017  3:52 PM

## 2017-02-28 NOTE — Consult Note (Signed)
[facility] Consult   Reason for Consult: Self-neglect, anxiety depression Referring Physician:  Dr Florencia Reasons Patient Identification: [name] MRN:  [id number] Principal Diagnosis: MDD (major depressive disorder), recurrent episode, mild ([place]) Diagnosis:   Patient Active Problem List   Diagnosis Date Noted  . Diarrhea [R19.7] 02/27/2017  . Lower GI bleed [K92.2] 02/27/2017  . DKA (diabetic ketoacidosis) ([place]) [E13.10] 02/26/2017  . Acute metabolic encephalopathy [S01.09]   . DKA, type 1 ([place]) [E10.10] 06/11/2013  . Orthostatic hypotension dysautonomic syndrome ([place]) [G90.3] 04/28/2013  . Body aches [R52] 04/28/2013  . Orthostatic hypotension [I95.1] 04/28/2013  . Diabetes mellitus type 1 ([place]) [E10.9] 04/28/2013  . Protein-calorie malnutrition, severe ([place]) [E43] 04/28/2013  . Noncompliance with diabetes treatment [Z91.19] 09/01/2012  . Dyspnea [R06.00] 02/28/2012  . Sinus tachycardia [R00.0] 02/28/2012  . Hyperglycemia without ketosis [R73.9] 02/28/2012  . Chest pain [R07.9] 12/04/2011  . Diabetes type 1, uncontrolled ([place]) [E10.65] 11/14/2011  . Hypertension associated with diabetes ([place]) [E11.59, I10] 11/14/2011  . Gastroparesis [K31.84] 11/14/2011  . Hyponatremia [E87.1] 09/19/2011  . Abdominal pain [R10.9] 09/18/2011  . Metabolic alkalosis [N23.5] 09/18/2011  . Diabetic hyperosmolar non-ketotic state ([place]) [E11.00] 09/18/2011  . DIAB W/UNSPEC COMP TYPE I [JUV TYPE] UNCNTRL [E10.8, E10.65] 06/04/2010  . CONSTIPATION [K59.00] 01/01/2010  . RECTAL PAIN [K62.89] 01/01/2010    Total Time spent with patient: 45 minutes  Subjective:   [name] is a 26 y.o. female patient admitted with diabetic ketoacidosis and altered mental status.  HPI: Patient is 26 year old African-American employed, married female who was admitted due to change mental status and significant hyperglycemia with diabetic ketoacidosis.  The consult was called as patient has been  feeling nervous and anxious and having self-neglect and not able to take care of herself.  Patient admitted that she has a history of depression but she has been noncompliant with medication for past 4 years.  She recently started working at [employer] center 2 months ago and her job is very stressful.  She endorsed at having panic attacks, crying spells, poor sleep, anhedonia and nervousness.  She admitted poor attention, concentration, fatigue lack of motivation to do things.  She is doing more than 9-10 hours a day job and she gets very tired when she comes home.  She lives with her husband who also works at [employer].  Together they have 79-year-old child.  Patient has support system from her own family and husband's family but lately she admitted that she is very worried about the future.  She also endorsed noncompliant with insulin because she could not afford due to financial reasons.  She has a lot of bills to pay.  She admitted irritability, frustration, socially withdrawn and isolated.  She also endorsed sometimes having dreams and she having panic attacks.  Currently she is not taking any psychotropic medication.  She used to take Celexa a few years ago which worked very well but she stopped after 5 months.  Patient denies any hallucination, paranoia, suicidal thoughts, aggressive behavior, PTSD or any OCD symptoms.  She appears tired and she is willing to restart Celexa.  Past Psychiatric History: Patient denies any history of psychiatric inpatient treatment or any suicidal attempt.  She denies any history of mania, psychosis or any hallucination.  She had tried Celexa with good response few years ago when she had anxiety and panic attacks.  She did very well on Celexa but stopped after 5 months when she got better.  Risk  to Self: Is patient at risk for suicide?: No Risk to Others:   Prior Inpatient Therapy:   Prior Outpatient Therapy:    Past Medical History:  Past Medical  History:  Diagnosis Date  . Anxiety   . Diabetes type 1, uncontrolled ([place]) 11/14/2011   Since age 24  . Fibromyalgia   . Gastroparesis   . Hypertension   . Infection    UTI April 2016    Past Surgical History:  Procedure Laterality Date  . ANKLE SURGERY    . CHOLECYSTECTOMY  11/15/2011   Procedure: LAPAROSCOPIC CHOLECYSTECTOMY WITH INTRAOPERATIVE CHOLANGIOGRAM;  Surgeon: [name], MD;  Location: [facility];  Service: General;  Laterality: N/A;  . ESOPHAGOGASTRODUODENOSCOPY  12/03/2011   Procedure: ESOPHAGOGASTRODUODENOSCOPY (EGD);  Surgeon: Beryle Beams, MD;  Location: [facility];  Service: Endoscopy;  Laterality: N/A;  . LAPAROSCOPY  11/23/2011   Procedure: LAPAROSCOPY DIAGNOSTIC;  Surgeon: Edward Jolly, MD;  Location: [facility];  Service: General;  Laterality: N/A;   Family History:  Family History  Problem Relation Age of Onset  . Diabetes Mother   . Hypertension Father    Family Psychiatric  History: Reviewed. Social History:  Social History   Substance and Sexual Activity  Alcohol Use Yes  . Alcohol/week: 0.0 oz   Comment: rare      Social History   Substance and Sexual Activity  Drug Use Yes  . Types: Marijuana   Comment: uncertain when last use was    Social History   Socioeconomic History  . Marital status: Married    Spouse name: sergio  . Number of children: 0  . Years of education: college  . Highest education level: None  Social Needs  . Financial resource strain: None  . Food insecurity - worry: None  . Food insecurity - inability: None  . Transportation needs - medical: None  . Transportation needs - non-medical: None  Occupational History  . Occupation: [employer] call center    Employer: [employer]  Tobacco Use  . Smoking status: Never Smoker  . Smokeless tobacco: Never Used  Substance and Sexual Activity  . Alcohol use: Yes    Alcohol/week: 0.0 oz    Comment: rare   . Drug use: Yes    Types: Marijuana    Comment:  uncertain when last use was  . Sexual activity: Not Currently    Partners: Male    Birth control/protection: None  Other Topics Concern  . None  Social History Narrative  . None   Additional Social History:    Allergies:   Allergies  Allergen Reactions  . Peanut-Containing Drug Products Anaphylaxis and Other (See Comments)    Pt states that she is allergic to [place] nuts.      . Lactose Intolerance (Gi) Other (See Comments)    Gi upset     Labs:  Results for orders placed or performed during the hospital encounter of 02/26/17 (from the past 48 hour(s))  CBG monitoring, ED     Status: Abnormal   Collection Time: 02/26/17  4:18 PM  Result Value Ref Range   Glucose-Capillary 395 (H) 65 - 99 mg/dL   Comment 1 Notify RN   CBC with Differential/Platelet     Status: Abnormal   Collection Time: 02/26/17  4:45 PM  Result Value Ref Range   WBC 15.2 (H) 4.0 - 10.5 K/uL   RBC 2.97 (L) 3.87 - 5.11 MIL/uL   Hemoglobin 9.2 (L) 12.0 - 15.0 g/dL  HCT 29.5 (L) 36.0 - 46.0 %   MCV 99.3 78.0 - 100.0 fL   MCH 31.0 26.0 - 34.0 pg   MCHC 31.2 30.0 - 36.0 g/dL   RDW 14.5 11.5 - 15.5 %   Platelets 298 150 - 400 K/uL    Comment: REPEATED TO VERIFY SPECIMEN CHECKED FOR CLOTS PLATELET COUNT CONFIRMED BY SMEAR    Neutrophils Relative % 79 %   Lymphocytes Relative 7 %   Monocytes Relative 14 %   Eosinophils Relative 0 %   Basophils Relative 0 %   Neutro Abs 12.0 (H) 1.7 - 7.7 K/uL   Lymphs Abs 1.1 0.7 - 4.0 K/uL   Monocytes Absolute 2.1 (H) 0.1 - 1.0 K/uL   Eosinophils Absolute 0.0 0.0 - 0.7 K/uL   Basophils Absolute 0.0 0.0 - 0.1 K/uL   WBC Morphology WHITE COUNT CONFIRMED ON SMEAR     Comment: MILD LEFT SHIFT (1-5% METAS, OCC MYELO, OCC BANDS) TOXIC GRANULATION    Smear Review PLATELET COUNT CONFIRMED BY SMEAR   Comprehensive metabolic panel     Status: Abnormal   Collection Time: 02/26/17  4:45 PM  Result Value Ref Range   Sodium 138 135 - 145 mmol/L   Potassium 4.0 3.5 - 5.1  mmol/L   Chloride 110 101 - 111 mmol/L   CO2 <7 (L) 22 - 32 mmol/L    Comment: REPEATED TO VERIFY   Glucose, Bld 430 (H) 65 - 99 mg/dL   BUN 7 6 - 20 mg/dL   Creatinine, Ser 0.86 0.44 - 1.00 mg/dL   Calcium 8.7 (L) 8.9 - 10.3 mg/dL   Total Protein 7.8 6.5 - 8.1 g/dL   Albumin 2.2 (L) 3.5 - 5.0 g/dL   AST 13 (L) 15 - 41 U/L   ALT 11 (L) 14 - 54 U/L   Alkaline Phosphatase 181 (H) 38 - 126 U/L   Total Bilirubin 2.1 (H) 0.3 - 1.2 mg/dL   GFR calc non Af Amer >60 >60 mL/min   GFR calc Af Amer >60 >60 mL/min    Comment: (NOTE) The eGFR has been calculated using the CKD EPI equation. This calculation has not been validated in all clinical situations. eGFR's persistently <60 mL/min signify possible Chronic Kidney Disease.    Anion gap NOT CALCULATED 5 - 15  Blood Culture (routine x 2)     Status: None (Preliminary result)   Collection Time: 02/26/17  4:45 PM  Result Value Ref Range   Specimen Description BLOOD RIGHT ANTECUBITAL    Special Requests      BOTTLES DRAWN AEROBIC AND ANAEROBIC Blood Culture adequate volume   Culture      NO GROWTH < 24 HOURS Performed at [facility], [place] [address] [place], [place] [address]    Report Status PENDING   Blood Culture (routine x 2)     Status: None (Preliminary result)   Collection Time: 02/26/17  4:55 PM  Result Value Ref Range   Specimen Description BLOOD LEFT ANTECUBITAL    Special Requests      BOTTLES DRAWN AEROBIC AND ANAEROBIC Blood Culture adequate volume   Culture  Setup Time      GRAM POSITIVE COCCI IN CHAINS ANAEROBIC BOTTLE ONLY CRITICAL RESULT CALLED TO, READ BACK BY AND VERIFIED WITH: B. GREEN PHARMD, AT 2333 02/27/17 BY D. VANHOOK Performed at [facility], [place] [address], [place], [place] [address]    Culture GRAM POSITIVE COCCI    Report Status PENDING  Blood Culture ID Panel (Reflexed)     Status: None   Collection Time: 02/26/17  4:55 PM  Result Value Ref Range   Enterococcus species NOT DETECTED  NOT DETECTED   Listeria monocytogenes NOT DETECTED NOT DETECTED   Staphylococcus species NOT DETECTED NOT DETECTED   Staphylococcus aureus NOT DETECTED NOT DETECTED   Streptococcus species NOT DETECTED NOT DETECTED   Streptococcus agalactiae NOT DETECTED NOT DETECTED   Streptococcus pneumoniae NOT DETECTED NOT DETECTED   Streptococcus pyogenes NOT DETECTED NOT DETECTED   Acinetobacter baumannii NOT DETECTED NOT DETECTED   Enterobacteriaceae species NOT DETECTED NOT DETECTED   Enterobacter cloacae complex NOT DETECTED NOT DETECTED   Escherichia coli NOT DETECTED NOT DETECTED   Klebsiella oxytoca NOT DETECTED NOT DETECTED   Klebsiella pneumoniae NOT DETECTED NOT DETECTED   Proteus species NOT DETECTED NOT DETECTED   Serratia marcescens NOT DETECTED NOT DETECTED   Haemophilus influenzae NOT DETECTED NOT DETECTED   Neisseria meningitidis NOT DETECTED NOT DETECTED   Pseudomonas aeruginosa NOT DETECTED NOT DETECTED   Candida albicans NOT DETECTED NOT DETECTED   Candida glabrata NOT DETECTED NOT DETECTED   Candida krusei NOT DETECTED NOT DETECTED   Candida parapsilosis NOT DETECTED NOT DETECTED   Candida tropicalis NOT DETECTED NOT DETECTED    Comment: Performed at [facility], [address] [place], [place] [address]  I-Stat Beta hCG blood, ED ([facility], [facility], [facility] only)     Status: None   Collection Time: 02/26/17  4:56 PM  Result Value Ref Range   I-stat hCG, quantitative <5.0 <5 mIU/mL   Comment 3            Comment:   GEST. AGE      CONC.  (mIU/mL)   <=1 WEEK        5 - 50     2 WEEKS       50 - 500     3 WEEKS       100 - 10,000     4 WEEKS     1,000 - 30,000        FEMALE AND NON-PREGNANT FEMALE:     LESS THAN 5 mIU/mL   I-stat chem 8, ed     Status: Abnormal   Collection Time: 02/26/17  4:58 PM  Result Value Ref Range   Sodium 139 135 - 145 mmol/L   Potassium 3.9 3.5 - 5.1 mmol/L   Chloride 114 (H) 101 - 111 mmol/L   BUN 4 (L) 6 - 20 mg/dL   Creatinine, Ser <0.20 (L) 0.44  - 1.00 mg/dL   Glucose, Bld 461 (H) 65 - 99 mg/dL   Calcium, Ion 1.34 1.15 - 1.40 mmol/L   TCO2 6 (L) 22 - 32 mmol/L   Hemoglobin 9.9 (L) 12.0 - 15.0 g/dL   HCT 29.0 (L) 36.0 - 46.0 %  I-Stat CG4 Lactic Acid, ED     Status: None   Collection Time: 02/26/17  4:59 PM  Result Value Ref Range   Lactic Acid, Venous 0.87 0.5 - 1.9 mmol/L  Blood gas, venous     Status: Abnormal   Collection Time: 02/26/17  5:32 PM  Result Value Ref Range   pH, Ven 6.881 (LL) 7.250 - 7.430    Comment: CRITICAL RESULT CALLED TO, READ BACK BY AND VERIFIED WITH: DR. ZAMMIT AT 9741 BY DEE WALTERS RRT ON 02/26/17    pCO2, Ven 20.4 (L) 44.0 - 60.0 mmHg   pO2, Ven 83.7 (H)  32.0 - 45.0 mmHg   Bicarbonate 3.6 (L) 20.0 - 28.0 mmol/L   Acid-base deficit 28.8 (H) 0.0 - 2.0 mmol/L   O2 Saturation 89.8 %   Patient temperature 98.6    Collection site DRAWN BY RN    Sample type DRAWN BY RN   CBG monitoring, ED     Status: Abnormal   Collection Time: 02/26/17  5:44 PM  Result Value Ref Range   Glucose-Capillary 397 (H) 65 - 99 mg/dL  CBG monitoring, ED     Status: Abnormal   Collection Time: 02/26/17  6:57 PM  Result Value Ref Range   Glucose-Capillary 380 (H) 65 - 99 mg/dL  Magnesium     Status: None   Collection Time: 02/26/17  8:11 PM  Result Value Ref Range   Magnesium 1.9 1.7 - 2.4 mg/dL  Phosphorus     Status: None   Collection Time: 02/26/17  8:11 PM  Result Value Ref Range   Phosphorus 2.9 2.5 - 4.6 mg/dL  Basic metabolic panel     Status: Abnormal   Collection Time: 02/26/17  8:11 PM  Result Value Ref Range   Sodium 139 135 - 145 mmol/L   Potassium 3.0 (L) 3.5 - 5.1 mmol/L    Comment: DELTA CHECK NOTED   Chloride 116 (H) 101 - 111 mmol/L   CO2 <7 (L) 22 - 32 mmol/L   Glucose, Bld 355 (H) 65 - 99 mg/dL   BUN 8 6 - 20 mg/dL   Creatinine, Ser 0.82 0.44 - 1.00 mg/dL   Calcium 7.9 (L) 8.9 - 10.3 mg/dL   GFR calc non Af Amer >60 >60 mL/min   GFR calc Af Amer >60 >60 mL/min    Comment: (NOTE) The  eGFR has been calculated using the CKD EPI equation. This calculation has not been validated in all clinical situations. eGFR's persistently <60 mL/min signify possible Chronic Kidney Disease.    Anion gap NOT CALCULATED 5 - 15  CBC     Status: Abnormal   Collection Time: 02/26/17  8:11 PM  Result Value Ref Range   WBC 14.0 (H) 4.0 - 10.5 K/uL   RBC 2.67 (L) 3.87 - 5.11 MIL/uL   Hemoglobin 8.3 (L) 12.0 - 15.0 g/dL   HCT 26.7 (L) 36.0 - 46.0 %   MCV 100.0 78.0 - 100.0 fL   MCH 31.1 26.0 - 34.0 pg   MCHC 31.1 30.0 - 36.0 g/dL   RDW 14.5 11.5 - 15.5 %   Platelets 222 150 - 400 K/uL  CBG monitoring, ED     Status: Abnormal   Collection Time: 02/26/17  8:12 PM  Result Value Ref Range   Glucose-Capillary 276 (H) 65 - 99 mg/dL  CBG monitoring, ED     Status: Abnormal   Collection Time: 02/26/17  9:37 PM  Result Value Ref Range   Glucose-Capillary 255 (H) 65 - 99 mg/dL  Glucose, capillary     Status: Abnormal   Collection Time: 02/26/17 10:47 PM  Result Value Ref Range   Glucose-Capillary 218 (H) 65 - 99 mg/dL   Comment 1 Notify RN    Comment 2 Document in Chart   Urine culture     Status: None   Collection Time: 02/26/17 11:00 PM  Result Value Ref Range   Specimen Description URINE, RANDOM    Special Requests NONE    Culture      NO GROWTH Performed at [facility], [address] [place], [place]  [address]    Report Status 02/28/2017 FINAL   Urine rapid drug screen (hosp performed)not at [facility]     Status: None   Collection Time: 02/26/17 11:00 PM  Result Value Ref Range   Opiates NONE DETECTED NONE DETECTED   Cocaine NONE DETECTED NONE DETECTED   Benzodiazepines NONE DETECTED NONE DETECTED   Amphetamines NONE DETECTED NONE DETECTED   Tetrahydrocannabinol NONE DETECTED NONE DETECTED   Barbiturates NONE DETECTED NONE DETECTED    Comment:        DRUG SCREEN FOR MEDICAL PURPOSES ONLY.  IF CONFIRMATION IS NEEDED FOR ANY PURPOSE, NOTIFY LAB WITHIN 5 DAYS.         LOWEST DETECTABLE LIMITS FOR URINE DRUG SCREEN Drug Class       Cutoff (ng/mL) Amphetamine      1000 Barbiturate      200 Benzodiazepine   357 Tricyclics       017 Opiates          300 Cocaine          300 THC              50   MRSA PCR Screening     Status: None   Collection Time: 02/26/17 11:00 PM  Result Value Ref Range   MRSA by PCR NEGATIVE NEGATIVE    Comment:        The GeneXpert MRSA Assay (FDA approved for NASAL specimens only), is one component of a comprehensive MRSA colonization surveillance program. It is not intended to diagnose MRSA infection nor to guide or monitor treatment for MRSA infections.   Glucose, capillary     Status: Abnormal   Collection Time: 02/27/17 12:06 AM  Result Value Ref Range   Glucose-Capillary 153 (H) 65 - 99 mg/dL  CBC with Differential/Platelet     Status: Abnormal   Collection Time: 02/27/17 12:15 AM  Result Value Ref Range   WBC 11.6 (H) 4.0 - 10.5 K/uL   RBC 2.00 (L) 3.87 - 5.11 MIL/uL   Hemoglobin 6.4 (LL) 12.0 - 15.0 g/dL    Comment: DELTA CHECK NOTED REPEATED TO VERIFY CRITICAL RESULT CALLED TO, READ BACK BY AND VERIFIED WITH: RYAN,K RN 11.30.18 _0  ZANDO,C    HCT 19.1 (L) 36.0 - 46.0 %   MCV 95.5 78.0 - 100.0 fL   MCH 32.0 26.0 - 34.0 pg   MCHC 33.5 30.0 - 36.0 g/dL   RDW 14.4 11.5 - 15.5 %   Platelets 201 150 - 400 K/uL   Neutrophils Relative % 79 %   Lymphocytes Relative 9 %   Monocytes Relative 12 %   Eosinophils Relative 0 %   Basophils Relative 0 %   Neutro Abs 9.2 (H) 1.7 - 7.7 K/uL   Lymphs Abs 1.0 0.7 - 4.0 K/uL   Monocytes Absolute 1.4 (H) 0.1 - 1.0 K/uL   Eosinophils Absolute 0.0 0.0 - 0.7 K/uL   Basophils Absolute 0.0 0.0 - 0.1 K/uL   WBC Morphology MILD LEFT SHIFT (1-5% METAS, OCC MYELO, OCC BANDS)   Basic metabolic panel     Status: Abnormal   Collection Time: 02/27/17 12:15 AM  Result Value Ref Range   Sodium 140 135 - 145 mmol/L   Potassium 3.1 (L) 3.5 - 5.1 mmol/L   Chloride 119 (H) 101  - 111 mmol/L   CO2 9 (L) 22 - 32 mmol/L   Glucose, Bld 203 (H) 65 - 99 mg/dL   BUN 6 6 - 20 mg/dL   Creatinine,  Ser 0.73 0.44 - 1.00 mg/dL   Calcium 7.7 (L) 8.9 - 10.3 mg/dL   GFR calc non Af Amer >60 >60 mL/min   GFR calc Af Amer >60 >60 mL/min    Comment: (NOTE) The eGFR has been calculated using the CKD EPI equation. This calculation has not been validated in all clinical situations. eGFR's persistently <60 mL/min signify possible Chronic Kidney Disease.    Anion gap 12 5 - 15  Lactic acid, plasma     Status: None   Collection Time: 02/27/17 12:15 AM  Result Value Ref Range   Lactic Acid, Venous 1.4 0.5 - 1.9 mmol/L  Procalcitonin     Status: None   Collection Time: 02/27/17 12:15 AM  Result Value Ref Range   Procalcitonin 0.20 ng/mL    Comment:        Interpretation: PCT (Procalcitonin) <= 0.5 ng/mL: Systemic infection (sepsis) is not likely. Local bacterial infection is possible. (NOTE)       Sepsis PCT Algorithm           Lower Respiratory Tract                                      Infection PCT Algorithm    ----------------------------     ----------------------------         PCT < 0.25 ng/mL                PCT < 0.10 ng/mL         Strongly encourage             Strongly discourage   discontinuation of antibiotics    initiation of antibiotics    ----------------------------     -----------------------------       PCT 0.25 - 0.50 ng/mL            PCT 0.10 - 0.25 ng/mL               OR       >80% decrease in PCT            Discourage initiation of                                            antibiotics      Encourage discontinuation           of antibiotics    ----------------------------     -----------------------------         PCT >= 0.50 ng/mL              PCT 0.26 - 0.50 ng/mL               AND        <80% decrease in PCT             Encourage initiation of                                             antibiotics       Encourage continuation           of  antibiotics    ----------------------------     -----------------------------  PCT >= 0.50 ng/mL                  PCT > 0.50 ng/mL               AND         increase in PCT                  Strongly encourage                                      initiation of antibiotics    Strongly encourage escalation           of antibiotics                                     -----------------------------                                           PCT <= 0.25 ng/mL                                                 OR                                        > 80% decrease in PCT                                     Discontinue / Do not initiate                                             antibiotics   Protime-INR     Status: Abnormal   Collection Time: 02/27/17 12:15 AM  Result Value Ref Range   Prothrombin Time 16.5 (H) 11.4 - 15.2 seconds   INR 1.34   APTT     Status: None   Collection Time: 02/27/17 12:15 AM  Result Value Ref Range   aPTT 33 24 - 36 seconds  Hemoglobin A1c     Status: Abnormal   Collection Time: 02/27/17 12:15 AM  Result Value Ref Range   Hgb A1c MFr Bld 12.2 (H) 4.8 - 5.6 %    Comment: (NOTE) Pre diabetes:          5.7%-6.4% Diabetes:              >6.4% Glycemic control for   <7.0% adults with diabetes    Mean Plasma Glucose 303.44 mg/dL    Comment: Performed at [facility], [place] [address] [place], [place] [address]  Glucose, capillary     Status: Abnormal   Collection Time: 02/27/17  1:08 AM  Result Value Ref Range   Glucose-Capillary 162 (H) 65 - 99 mg/dL  Prepare RBC     Status: None   Collection Time: 02/27/17  1:30  AM  Result Value Ref Range   Order Confirmation ORDER PROCESSED BY BLOOD BANK   Type and screen [place]     Status: None (Preliminary result)   Collection Time: 02/27/17  2:00 AM  Result Value Ref Range   ABO/RH(D) O POS    Antibody Screen NEG    Sample Expiration 03/02/2017    Unit Number [id number]    Blood  Component Type RBC LR PHER1    Unit division 00    Status of Unit ISSUED,FINAL    Transfusion Status OK TO TRANSFUSE    Crossmatch Result Compatible    Unit Number [id number]    Blood Component Type RBC LR PHER1    Unit division 00    Status of Unit ALLOCATED    Transfusion Status OK TO TRANSFUSE    Crossmatch Result Compatible   ABO/Rh     Status: None   Collection Time: 02/27/17  2:00 AM  Result Value Ref Range   ABO/RH(D) O POS   Glucose, capillary     Status: Abnormal   Collection Time: 02/27/17  2:09 AM  Result Value Ref Range   Glucose-Capillary 148 (H) 65 - 99 mg/dL  Prepare RBC     Status: None   Collection Time: 02/27/17  3:00 AM  Result Value Ref Range   Order Confirmation ORDER PROCESSED BY BLOOD BANK   Glucose, capillary     Status: Abnormal   Collection Time: 02/27/17  3:16 AM  Result Value Ref Range   Glucose-Capillary 140 (H) 65 - 99 mg/dL  CBC with Differential/Platelet     Status: Abnormal   Collection Time: 02/27/17  3:55 AM  Result Value Ref Range   WBC 9.7 4.0 - 10.5 K/uL   RBC 2.12 (L) 3.87 - 5.11 MIL/uL   Hemoglobin 6.8 (LL) 12.0 - 15.0 g/dL    Comment: CRITICAL VALUE NOTED.  VALUE IS CONSISTENT WITH PREVIOUSLY REPORTED AND CALLED VALUE.   HCT 19.9 (L) 36.0 - 46.0 %   MCV 93.9 78.0 - 100.0 fL   MCH 32.1 26.0 - 34.0 pg   MCHC 34.2 30.0 - 36.0 g/dL   RDW 14.3 11.5 - 15.5 %   Platelets 186 150 - 400 K/uL   Neutrophils Relative % 76 %   Neutro Abs 7.4 1.7 - 7.7 K/uL   Lymphocytes Relative 10 %   Lymphs Abs 1.0 0.7 - 4.0 K/uL   Monocytes Relative 14 %   Monocytes Absolute 1.3 (H) 0.1 - 1.0 K/uL   Eosinophils Relative 0 %   Eosinophils Absolute 0.0 0.0 - 0.7 K/uL   Basophils Relative 0 %   Basophils Absolute 0.0 0.0 - 0.1 K/uL  Basic metabolic panel     Status: Abnormal   Collection Time: 02/27/17  3:55 AM  Result Value Ref Range   Sodium 142 135 - 145 mmol/L   Potassium 3.1 (L) 3.5 - 5.1 mmol/L   Chloride 122 (H) 101 - 111 mmol/L   CO2 14  (L) 22 - 32 mmol/L   Glucose, Bld 153 (H) 65 - 99 mg/dL   BUN 6 6 - 20 mg/dL   Creatinine, Ser 0.50 0.44 - 1.00 mg/dL   Calcium 7.7 (L) 8.9 - 10.3 mg/dL   GFR calc non Af Amer >60 >60 mL/min   GFR calc Af Amer >60 >60 mL/min    Comment: (NOTE) The eGFR has been calculated using the CKD EPI equation. This calculation has not been validated in all clinical situations.  eGFR's persistently <60 mL/min signify possible Chronic Kidney Disease.    Anion gap 6 5 - 15  Basic metabolic panel     Status: Abnormal   Collection Time: 02/27/17  3:55 AM  Result Value Ref Range   Sodium 141 135 - 145 mmol/L   Potassium 3.1 (L) 3.5 - 5.1 mmol/L   Chloride 120 (H) 101 - 111 mmol/L   CO2 15 (L) 22 - 32 mmol/L   Glucose, Bld 153 (H) 65 - 99 mg/dL   BUN 6 6 - 20 mg/dL   Creatinine, Ser 0.53 0.44 - 1.00 mg/dL   Calcium 7.6 (L) 8.9 - 10.3 mg/dL   GFR calc non Af Amer >60 >60 mL/min   GFR calc Af Amer >60 >60 mL/min    Comment: (NOTE) The eGFR has been calculated using the CKD EPI equation. This calculation has not been validated in all clinical situations. eGFR's persistently <60 mL/min signify possible Chronic Kidney Disease.    Anion gap 6 5 - 15  Lactic acid, plasma     Status: None   Collection Time: 02/27/17  3:55 AM  Result Value Ref Range   Lactic Acid, Venous 1.4 0.5 - 1.9 mmol/L  HIV antibody     Status: None   Collection Time: 02/27/17  3:55 AM  Result Value Ref Range   HIV Screen 4th Generation wRfx Non Reactive Non Reactive    Comment: (NOTE) Performed At: [facility] [address] [place], [place] [id number] Rush Farmer MD [id number]   Glucose, capillary     Status: Abnormal   Collection Time: 02/27/17  4:34 AM  Result Value Ref Range   Glucose-Capillary 142 (H) 65 - 99 mg/dL  Glucose, capillary     Status: Abnormal   Collection Time: 02/27/17  5:36 AM  Result Value Ref Range   Glucose-Capillary 133 (H) 65 - 99 mg/dL  Glucose, capillary     Status: Abnormal    Collection Time: 02/27/17  6:47 AM  Result Value Ref Range   Glucose-Capillary 140 (H) 65 - 99 mg/dL  Glucose, capillary     Status: Abnormal   Collection Time: 02/27/17  7:35 AM  Result Value Ref Range   Glucose-Capillary 136 (H) 65 - 99 mg/dL   Comment 1 Notify RN    Comment 2 Document in Chart   Glucose, capillary     Status: Abnormal   Collection Time: 02/27/17  7:52 AM  Result Value Ref Range   Glucose-Capillary 150 (H) 65 - 99 mg/dL  Glucose, capillary     Status: Abnormal   Collection Time: 02/27/17  9:12 AM  Result Value Ref Range   Glucose-Capillary 146 (H) 65 - 99 mg/dL   Comment 1 Notify RN    Comment 2 Document in Chart    Comment 3 Glucose Stabilizer   Glucose, capillary     Status: Abnormal   Collection Time: 02/27/17 10:30 AM  Result Value Ref Range   Glucose-Capillary 197 (H) 65 - 99 mg/dL  Basic metabolic panel     Status: Abnormal   Collection Time: 02/27/17 11:36 AM  Result Value Ref Range   Sodium 139 135 - 145 mmol/L   Potassium 2.9 (L) 3.5 - 5.1 mmol/L   Chloride 117 (H) 101 - 111 mmol/L   CO2 17 (L) 22 - 32 mmol/L   Glucose, Bld 116 (H) 65 - 99 mg/dL   BUN 5 (L) 6 - 20 mg/dL   Creatinine, Ser 0.36 (L) 0.44 - 1.00  mg/dL   Calcium 7.5 (L) 8.9 - 10.3 mg/dL   GFR calc non Af Amer >60 >60 mL/min   GFR calc Af Amer >60 >60 mL/min    Comment: (NOTE) The eGFR has been calculated using the CKD EPI equation. This calculation has not been validated in all clinical situations. eGFR's persistently <60 mL/min signify possible Chronic Kidney Disease.    Anion gap 5 5 - 15  Glucose, capillary     Status: None   Collection Time: 02/27/17 11:50 AM  Result Value Ref Range   Glucose-Capillary 96 65 - 99 mg/dL  CBC with Differential/Platelet     Status: Abnormal   Collection Time: 02/27/17 12:00 PM  Result Value Ref Range   WBC 12.0 (H) 4.0 - 10.5 K/uL   RBC 2.82 (L) 3.87 - 5.11 MIL/uL   Hemoglobin 8.7 (L) 12.0 - 15.0 g/dL    Comment: DELTA CHECK NOTED POST  TRANSFUSION SPECIMEN    HCT 25.9 (L) 36.0 - 46.0 %   MCV 91.8 78.0 - 100.0 fL   MCH 30.9 26.0 - 34.0 pg   MCHC 33.6 30.0 - 36.0 g/dL   RDW 14.8 11.5 - 15.5 %   Platelets 197 150 - 400 K/uL   Neutrophils Relative % 78 %   Neutro Abs 9.4 (H) 1.7 - 7.7 K/uL   Lymphocytes Relative 6 %   Lymphs Abs 0.7 0.7 - 4.0 K/uL   Monocytes Relative 16 %   Monocytes Absolute 1.9 (H) 0.1 - 1.0 K/uL   Eosinophils Relative 0 %   Eosinophils Absolute 0.0 0.0 - 0.7 K/uL   Basophils Relative 0 %   Basophils Absolute 0.0 0.0 - 0.1 K/uL  Glucose, capillary     Status: Abnormal   Collection Time: 02/27/17 12:47 PM  Result Value Ref Range   Glucose-Capillary 100 (H) 65 - 99 mg/dL  Glucose, capillary     Status: Abnormal   Collection Time: 02/27/17  1:51 PM  Result Value Ref Range   Glucose-Capillary 111 (H) 65 - 99 mg/dL   Comment 1 Notify RN    Comment 2 Document in Chart   Glucose, capillary     Status: Abnormal   Collection Time: 02/27/17  2:55 PM  Result Value Ref Range   Glucose-Capillary 109 (H) 65 - 99 mg/dL  Glucose, capillary     Status: Abnormal   Collection Time: 02/27/17  5:01 PM  Result Value Ref Range   Glucose-Capillary 127 (H) 65 - 99 mg/dL   Comment 1 Notify RN    Comment 2 Document in Chart   CBC with Differential/Platelet     Status: Abnormal   Collection Time: 02/27/17  6:21 PM  Result Value Ref Range   WBC 9.5 4.0 - 10.5 K/uL   RBC 2.80 (L) 3.87 - 5.11 MIL/uL   Hemoglobin 8.6 (L) 12.0 - 15.0 g/dL   HCT 25.4 (L) 36.0 - 46.0 %   MCV 90.7 78.0 - 100.0 fL   MCH 30.7 26.0 - 34.0 pg   MCHC 33.9 30.0 - 36.0 g/dL   RDW 14.9 11.5 - 15.5 %   Platelets 173 150 - 400 K/uL   Neutrophils Relative % 77 %   Neutro Abs 7.4 1.7 - 7.7 K/uL   Lymphocytes Relative 9 %   Lymphs Abs 0.8 0.7 - 4.0 K/uL   Monocytes Relative 14 %   Monocytes Absolute 1.3 (H) 0.1 - 1.0 K/uL   Eosinophils Relative 0 %   Eosinophils Absolute 0.0 0.0 -  0.7 K/uL   Basophils Relative 0 %   Basophils Absolute 0.0  0.0 - 0.1 K/uL  Glucose, capillary     Status: None   Collection Time: 02/27/17 10:26 PM  Result Value Ref Range   Glucose-Capillary 93 65 - 99 mg/dL   Comment 1 Notify RN    Comment 2 Document in Chart   CBC with Differential/Platelet     Status: Abnormal   Collection Time: 02/27/17 11:48 PM  Result Value Ref Range   WBC 10.0 4.0 - 10.5 K/uL   RBC 2.90 (L) 3.87 - 5.11 MIL/uL   Hemoglobin 9.0 (L) 12.0 - 15.0 g/dL   HCT 26.5 (L) 36.0 - 46.0 %   MCV 91.4 78.0 - 100.0 fL   MCH 31.0 26.0 - 34.0 pg   MCHC 34.0 30.0 - 36.0 g/dL   RDW 15.3 11.5 - 15.5 %   Platelets 167 150 - 400 K/uL   Neutrophils Relative % 71 %   Neutro Abs 7.1 1.7 - 7.7 K/uL   Lymphocytes Relative 12 %   Lymphs Abs 1.2 0.7 - 4.0 K/uL   Monocytes Relative 17 %   Monocytes Absolute 1.7 (H) 0.1 - 1.0 K/uL   Eosinophils Relative 0 %   Eosinophils Absolute 0.0 0.0 - 0.7 K/uL   Basophils Relative 0 %   Basophils Absolute 0.0 0.0 - 0.1 K/uL  Procalcitonin - Baseline     Status: None   Collection Time: 02/28/17  1:00 AM  Result Value Ref Range   Procalcitonin 0.16 ng/mL    Comment:        Interpretation: PCT (Procalcitonin) <= 0.5 ng/mL: Systemic infection (sepsis) is not likely. Local bacterial infection is possible. (NOTE)       Sepsis PCT Algorithm           Lower Respiratory Tract                                      Infection PCT Algorithm    ----------------------------     ----------------------------         PCT < 0.25 ng/mL                PCT < 0.10 ng/mL         Strongly encourage             Strongly discourage   discontinuation of antibiotics    initiation of antibiotics    ----------------------------     -----------------------------       PCT 0.25 - 0.50 ng/mL            PCT 0.10 - 0.25 ng/mL               OR       >80% decrease in PCT            Discourage initiation of                                            antibiotics      Encourage discontinuation           of antibiotics     ----------------------------     -----------------------------         PCT >= 0.50 ng/mL  PCT 0.26 - 0.50 ng/mL               AND        <80% decrease in PCT             Encourage initiation of                                             antibiotics       Encourage continuation           of antibiotics    ----------------------------     -----------------------------        PCT >= 0.50 ng/mL                  PCT > 0.50 ng/mL               AND         increase in PCT                  Strongly encourage                                      initiation of antibiotics    Strongly encourage escalation           of antibiotics                                     -----------------------------                                           PCT <= 0.25 ng/mL                                                 OR                                        > 80% decrease in PCT                                     Discontinue / Do not initiate                                             antibiotics   Reticulocytes     Status: Abnormal   Collection Time: 02/28/17  7:00 AM  Result Value Ref Range   Retic Ct Pct 3.5 (H) 0.4 - 3.1 %   RBC. 2.57 (L) 3.87 - 5.11 MIL/uL   Retic Count, Absolute 90.0 19.0 - 186.0 K/uL  Basic metabolic panel     Status: Abnormal   Collection Time: 02/28/17  7:00 AM  Result Value Ref Range   Sodium 135 135 - 145 mmol/L  Potassium 3.2 (L) 3.5 - 5.1 mmol/L   Chloride 109 101 - 111 mmol/L   CO2 23 22 - 32 mmol/L   Glucose, Bld 141 (H) 65 - 99 mg/dL   BUN <5 (L) 6 - 20 mg/dL   Creatinine, Ser <0.30 (L) 0.44 - 1.00 mg/dL   Calcium 7.4 (L) 8.9 - 10.3 mg/dL   GFR calc non Af Amer NOT CALCULATED >60 mL/min   GFR calc Af Amer NOT CALCULATED >60 mL/min    Comment: (NOTE) The eGFR has been calculated using the CKD EPI equation. This calculation has not been validated in all clinical situations. eGFR's persistently <60 mL/min signify possible Chronic Kidney Disease.     Anion gap 3 (L) 5 - 15  CBC with Differential/Platelet     Status: Abnormal   Collection Time: 02/28/17  7:00 AM  Result Value Ref Range   WBC 8.6 4.0 - 10.5 K/uL   RBC 2.61 (L) 3.87 - 5.11 MIL/uL   Hemoglobin 8.1 (L) 12.0 - 15.0 g/dL   HCT 23.8 (L) 36.0 - 46.0 %   MCV 91.2 78.0 - 100.0 fL   MCH 31.0 26.0 - 34.0 pg   MCHC 34.0 30.0 - 36.0 g/dL   RDW 15.5 11.5 - 15.5 %   Platelets 177 150 - 400 K/uL   Neutrophils Relative % 60 %   Neutro Abs 5.2 1.7 - 7.7 K/uL   Lymphocytes Relative 19 %   Lymphs Abs 1.6 0.7 - 4.0 K/uL   Monocytes Relative 20 %   Monocytes Absolute 1.8 (H) 0.1 - 1.0 K/uL   Eosinophils Relative 1 %   Eosinophils Absolute 0.1 0.0 - 0.7 K/uL   Basophils Relative 0 %   Basophils Absolute 0.0 0.0 - 0.1 K/uL  Glucose, capillary     Status: Abnormal   Collection Time: 02/28/17  7:26 AM  Result Value Ref Range   Glucose-Capillary 138 (H) 65 - 99 mg/dL   Comment 1 Notify RN    Comment 2 Document in Chart     Current Facility-Administered Medications  Medication Dose Route Frequency Provider Last Rate Last Dose  . 0.9 %  sodium chloride infusion   Intravenous Continuous Karmen Bongo, MD   Stopped at 02/26/17 2311  . dextrose 5 %-0.45 % sodium chloride infusion   Intravenous Continuous Raylene Miyamoto, MD 125 mL/hr at 02/28/17 0315    . Influenza vac split quadrivalent PF (FLUARIX) injection 0.5 mL  0.5 mL Intramuscular Prior to discharge Aljishi, Virgina Norfolk, MD      . insulin aspart (novoLOG) injection 0-15 Units  0-15 Units Subcutaneous TID WC Ollis, Brandi L, NP   2 Units at 02/28/17 0801  . insulin aspart (novoLOG) injection 3 Units  3 Units Subcutaneous TID WC Ollis, Brandi L, NP      . insulin glargine (LANTUS) injection 15 Units  15 Units Subcutaneous Q24H Raylene Miyamoto, MD   15 Units at 02/27/17 1248  . insulin regular (NOVOLIN R,HUMULIN R) 100 Units in sodium chloride 0.9 % 100 mL (1 Units/mL) infusion   Intravenous Continuous Karmen Bongo, MD    Stopped at 02/27/17 1400  . ondansetron (ZOFRAN) injection 4 mg  4 mg Intravenous Q6H PRN Chesley Mires, MD   4 mg at 02/27/17 0744  . promethazine (PHENERGAN) injection 12.5-25 mg  12.5-25 mg Intravenous Q6H PRN Chesley Mires, MD   12.5 mg at 02/27/17 0537  . traMADol (ULTRAM) tablet 50 mg  50 mg Oral Q6H PRN Tera Partridge  E, MD   50 mg at 02/28/17 3568    Musculoskeletal: Strength & Muscle Tone: within normal limits Gait & Station: Lying on her bed Patient leans: N/A  Psychiatric Specialty Exam: Physical Exam  Psychiatric: Her mood appears anxious. She exhibits a depressed mood.    Review of Systems  Constitutional: Positive for malaise/fatigue.  Psychiatric/Behavioral: Positive for depression. The patient is nervous/anxious and has insomnia.     Blood pressure 98/61, pulse (!) 104, temperature 99.7 F (37.6 C), temperature source Oral, resp. rate 10, height _0  (1.702 m), weight 52.2 kg (115 lb), SpO2 93 %, currently breastfeeding.Body mass index is 18.01 kg/m.  General Appearance: Fairly Groomed  Eye Contact:  Fair  Speech:  Slow  Volume:  Decreased  Mood:  Anxious, Depressed and Dysphoric  Affect:  Constricted  Thought Process:  Goal Directed  Orientation:  Full (Time, Place, and Person)  Thought Content:  Rumination  Suicidal Thoughts:  No  Homicidal Thoughts:  No  Memory:  Immediate;   Good Recent;   Good Remote;   Good  Judgement:  Fair  Insight:  Fair  Psychomotor Activity:  Decreased  Concentration:  Concentration: Fair and Attention Span: Fair  Recall:  [place] of Knowledge:  Good  Language:  Good  Akathisia:  No  Handed:  Right  AIMS (if indicated):     Assets:  Communication Skills Desire for Improvement Housing  ADL's:  Intact  Cognition:  WNL  Sleep:       Assessment: Tomicka is a 27 year old African-American female who has severe anxiety and depression.  Patient was admitted with altered mental status, hyperglycemia and diabetic  ketoacidosis.  Plan: I reviewed blood work results, history, collateral information and current medication.  Patient will get benefit from Celexa she had in the past with good response.  Start Celexa 10 mg daily for a few days and if there is no side effects and she can try 20 mg.  Call social worker for assessment as patient has experiencing a lot of financial issues and noncompliant with her medication including insulin.  Upon discharge patient can also get benefit from counseling and therapy for CBT.  I have discussed medication side effects and benefits with the patient.  Please call us back if you have any question.  Disposition: No evidence of imminent risk to self or others at present.   Patient does not meet criteria for psychiatric inpatient admission. Supportive therapy provided about ongoing stressors.  Kathlee Nations, MD 02/28/2017 10:26 AM

## 2017-02-28 NOTE — Progress Notes (Signed)
eLink Physician-Brief Progress Note Patient Name: [name] DOB: 1990-11-24 MRN: [id number]   Date of Service  02/28/2017  HPI/Events of Note  Notified of preliminary blood culture 1 out of 4 with gram-positive cocci. PCR negative. Patient with temperatures both hyperthermic and febrile to 101.28F. Intermittent hypotension with fentanyl noted. Initial mild leukocytosis with preserved differential during fluid resuscitation. Normal lactic acid. Urinalysis reviewed and appears noninfectious.   eICU Interventions  1. Awaiting finalization of blood cultures 2. Holding on antibiotic therapy 3. Trending Procalcitonin with low threshold to initiate antibiotic regimen      Intervention Category Major Interventions: Infection - evaluation and management  Lawanda Cousins 02/28/2017, 12:07 AM

## 2017-03-01 ENCOUNTER — Encounter (HOSPITAL_COMMUNITY)
Admission: EM | Disposition: A | Discharge status: Home / Self Care | Payer: Self-pay | Source: Home / Self Care | Attending: Family Medicine

## 2017-03-01 ENCOUNTER — Inpatient Hospital Stay (HOSPITAL_COMMUNITY): Payer: 59 | Admitting: Certified Registered"

## 2017-03-01 ENCOUNTER — Encounter (HOSPITAL_COMMUNITY): Payer: Self-pay | Admitting: Certified Registered"

## 2017-03-01 DIAGNOSIS — R188 Other ascites: Secondary | ICD-10-CM

## 2017-03-01 DIAGNOSIS — R634 Abnormal weight loss: Secondary | ICD-10-CM

## 2017-03-01 DIAGNOSIS — K611 Rectal abscess: Secondary | ICD-10-CM

## 2017-03-01 HISTORY — PX: INCISION AND DRAINAGE PERIRECTAL ABSCESS: SHX1804

## 2017-03-01 LAB — TSH: TSH: 2.112 u[IU]/mL (ref 0.350–4.500)

## 2017-03-01 LAB — COMPREHENSIVE METABOLIC PANEL
ALT: 7 U/L — ABNORMAL LOW (ref 14–54)
AST: 14 U/L — ABNORMAL LOW (ref 15–41)
Albumin: 1.4 g/dL — ABNORMAL LOW (ref 3.5–5.0)
Alkaline Phosphatase: 106 U/L (ref 38–126)
Anion gap: 3 — ABNORMAL LOW (ref 5–15)
BUN: <5 mg/dL — ABNORMAL LOW (ref 6–20)
CO2: 27 mmol/L (ref 22–32)
Calcium: 7.6 mg/dL — ABNORMAL LOW (ref 8.9–10.3)
Chloride: 106 mmol/L (ref 101–111)
Creatinine, Ser: 0.4 mg/dL — ABNORMAL LOW (ref 0.44–1.00)
GFR calc Af Amer: >60 mL/min (ref >60)
GFR calc non Af Amer: >60 mL/min (ref >60)
Glucose, Bld: 75 mg/dL (ref 65–99)
Potassium: 3.6 mmol/L (ref 3.5–5.1)
Sodium: 136 mmol/L (ref 135–145)
Total Bilirubin: 0.4 mg/dL (ref 0.3–1.2)
Total Protein: 6.1 g/dL — ABNORMAL LOW (ref 6.5–8.1)

## 2017-03-01 LAB — GLUCOSE, CAPILLARY
Glucose-Capillary: 104 mg/dL — ABNORMAL HIGH (ref 65–99)
Glucose-Capillary: 174 mg/dL — ABNORMAL HIGH (ref 65–99)
Glucose-Capillary: 218 mg/dL — ABNORMAL HIGH (ref 65–99)
Glucose-Capillary: 185 mg/dL — ABNORMAL HIGH (ref 65–99)

## 2017-03-01 LAB — PROCALCITONIN: Procalcitonin: <0.1 ng/mL

## 2017-03-01 LAB — CBC WITH DIFFERENTIAL/PLATELET
Basophils Absolute: 0 10*3/uL (ref 0.0–0.1)
Basophils Relative: 0 %
Eosinophils Absolute: 0.1 10*3/uL (ref 0.0–0.7)
Eosinophils Relative: 1 %
HCT: 24.3 % — ABNORMAL LOW (ref 36.0–46.0)
Hemoglobin: 8.2 g/dL — ABNORMAL LOW (ref 12.0–15.0)
Lymphocytes Relative: 20 %
Lymphs Abs: 1.7 10*3/uL (ref 0.7–4.0)
MCH: 31.1 pg (ref 26.0–34.0)
MCHC: 33.7 g/dL (ref 30.0–36.0)
MCV: 92 fL (ref 78.0–100.0)
Monocytes Absolute: 1.1 10*3/uL — ABNORMAL HIGH (ref 0.1–1.0)
Monocytes Relative: 12 %
Neutro Abs: 5.6 10*3/uL (ref 1.7–7.7)
Neutrophils Relative %: 67 %
Platelets: 149 10*3/uL — ABNORMAL LOW (ref 150–400)
RBC: 2.64 MIL/uL — ABNORMAL LOW (ref 3.87–5.11)
RDW: 15.3 % (ref 11.5–15.5)
WBC: 8.4 10*3/uL (ref 4.0–10.5)

## 2017-03-01 LAB — VITAMIN B12: Vitamin B-12: 3505 pg/mL — ABNORMAL HIGH (ref 180–914)

## 2017-03-01 LAB — FOLATE: Folate: 5.5 ng/mL — ABNORMAL LOW (ref >5.9)

## 2017-03-01 LAB — MAGNESIUM: Magnesium: 1.5 mg/dL — ABNORMAL LOW (ref 1.7–2.4)

## 2017-03-01 SURGERY — INCISION AND DRAINAGE, ABSCESS, PERIRECTAL
Anesthesia: General | Site: Rectum

## 2017-03-01 MED ORDER — ONDANSETRON HCL 4 MG/2ML IJ SOLN
INTRAMUSCULAR | Status: AC
  Filled 2017-03-01: qty 2

## 2017-03-01 MED ORDER — OXYCODONE HCL 5 MG/5ML PO SOLN: 5.0000 mg | Freq: Once | ORAL | Status: DC | PRN

## 2017-03-01 MED ORDER — BUPIVACAINE-EPINEPHRINE 0.25% -1:200000 IJ SOLN
INTRAMUSCULAR | Status: DC | PRN
  Administered 2017-03-01: 30 mL

## 2017-03-01 MED ORDER — MAGNESIUM SULFATE 2 GM/50ML IV SOLN
2.0000 g | Freq: Once | INTRAVENOUS | Status: AC
  Administered 2017-03-01: 2 g via INTRAVENOUS
  Filled 2017-03-01: qty 50

## 2017-03-01 MED ORDER — PROPOFOL 10 MG/ML IV BOLUS
INTRAVENOUS | Status: DC | PRN
  Administered 2017-03-01: 130 mg via INTRAVENOUS

## 2017-03-01 MED ORDER — LIDOCAINE 2% (20 MG/ML) 5 ML SYRINGE
INTRAMUSCULAR | Status: AC
  Filled 2017-03-01: qty 5

## 2017-03-01 MED ORDER — FENTANYL CITRATE (PF) 100 MCG/2ML IJ SOLN
INTRAMUSCULAR | Status: AC
  Filled 2017-03-01: qty 2

## 2017-03-01 MED ORDER — MIDAZOLAM HCL 5 MG/5ML IJ SOLN
INTRAMUSCULAR | Status: DC | PRN
  Administered 2017-03-01: 2 mg via INTRAVENOUS

## 2017-03-01 MED ORDER — FENTANYL CITRATE (PF) 100 MCG/2ML IJ SOLN
25.0000 ug | INTRAMUSCULAR | Status: DC | PRN
  Administered 2017-03-01: 50 ug via INTRAVENOUS

## 2017-03-01 MED ORDER — LIDOCAINE 2% (20 MG/ML) 5 ML SYRINGE
INTRAMUSCULAR | Status: DC | PRN
  Administered 2017-03-01: 50 mg via INTRAVENOUS

## 2017-03-01 MED ORDER — ACETAMINOPHEN 160 MG/5ML PO SOLN: 325.0000 mg | ORAL | Status: DC | PRN

## 2017-03-01 MED ORDER — PROPOFOL 10 MG/ML IV BOLUS
INTRAVENOUS | Status: AC
  Filled 2017-03-01: qty 20

## 2017-03-01 MED ORDER — OXYCODONE HCL 5 MG PO TABS: 5.0000 mg | ORAL_TABLET | Freq: Once | ORAL | Status: DC | PRN

## 2017-03-01 MED ORDER — MIDAZOLAM HCL 2 MG/2ML IJ SOLN
INTRAMUSCULAR | Status: AC
  Filled 2017-03-01: qty 2

## 2017-03-01 MED ORDER — FOLIC ACID 1 MG PO TABS
1.0000 mg | ORAL_TABLET | Freq: Every day | ORAL | Status: DC
  Administered 2017-03-01 – 2017-03-14 (×13): 1 mg via ORAL
  Filled 2017-03-01 (×13): qty 1

## 2017-03-01 MED ORDER — 0.9 % SODIUM CHLORIDE (POUR BTL) OPTIME
TOPICAL | Status: DC | PRN
  Administered 2017-03-01 (×2): 1000 mL

## 2017-03-01 MED ORDER — ONDANSETRON HCL 4 MG/2ML IJ SOLN
INTRAMUSCULAR | Status: DC | PRN
  Administered 2017-03-01: 4 mg via INTRAVENOUS

## 2017-03-01 MED ORDER — MORPHINE SULFATE (PF) 4 MG/ML IV SOLN
1.0000 mg | INTRAVENOUS | Status: DC | PRN
  Administered 2017-03-01: 2 mg via INTRAVENOUS
  Administered 2017-03-01 – 2017-03-09 (×39): 3 mg via INTRAVENOUS
  Filled 2017-03-01 (×40): qty 1

## 2017-03-01 MED ORDER — BUPIVACAINE-EPINEPHRINE (PF) 0.25% -1:200000 IJ SOLN
INTRAMUSCULAR | Status: AC
Start: 2017-03-01 — End: ?
  Filled 2017-03-01: qty 30

## 2017-03-01 MED ORDER — ACETAMINOPHEN 325 MG PO TABS: 325.0000 mg | ORAL_TABLET | ORAL | Status: DC | PRN

## 2017-03-01 MED ORDER — HYDROCODONE-ACETAMINOPHEN 5-325 MG PO TABS
1.0000 | ORAL_TABLET | ORAL | Status: DC | PRN
  Administered 2017-03-01 (×3): 2 via ORAL
  Administered 2017-03-02: 1 via ORAL
  Administered 2017-03-02 – 2017-03-07 (×23): 2 via ORAL
  Filled 2017-03-01 (×27): qty 2

## 2017-03-01 MED ORDER — FENTANYL CITRATE (PF) 100 MCG/2ML IJ SOLN
INTRAMUSCULAR | Status: DC | PRN
  Administered 2017-03-01 (×4): 25 ug via INTRAVENOUS

## 2017-03-01 SURGICAL SUPPLY — 23 items
BLADE SURG 15 STRL LF DISP TIS (BLADE) ×1 IMPLANT
BLADE SURG 15 STRL SS (BLADE) ×1
BNDG GAUZE ELAST 4 BULKY (GAUZE/BANDAGES/DRESSINGS) ×2 IMPLANT
BRIEF STRETCH FOR OB PAD LRG (UNDERPADS AND DIAPERS) ×2 IMPLANT
COVER SURGICAL LIGHT HANDLE (MISCELLANEOUS) ×2 IMPLANT
DRSG PAD ABDOMINAL 8X10 ST (GAUZE/BANDAGES/DRESSINGS) ×2 IMPLANT
ELECT PENCIL ROCKER SW 15FT (MISCELLANEOUS) ×2 IMPLANT
ELECT REM PT RETURN 15FT ADLT (MISCELLANEOUS) ×2 IMPLANT
GAUZE SPONGE 4X4 12PLY STRL (GAUZE/BANDAGES/DRESSINGS) ×2 IMPLANT
GLOVE SURG SIGNA 7.5 PF LTX (GLOVE) ×2 IMPLANT
GOWN STRL REUS W/TWL XL LVL3 (GOWN DISPOSABLE) ×4 IMPLANT
KIT BASIN OR (CUSTOM PROCEDURE TRAY) ×2 IMPLANT
NEEDLE HYPO 22GX1.5 SAFETY (NEEDLE) ×2 IMPLANT
PACK LITHOTOMY IV (CUSTOM PROCEDURE TRAY) ×2 IMPLANT
PAD ABD 8X10 STRL (GAUZE/BANDAGES/DRESSINGS) ×2 IMPLANT
SOL PREP POV-IOD 16OZ 10% (MISCELLANEOUS) ×2 IMPLANT
SPONGE LAP 18X18 X RAY DECT (DISPOSABLE) ×2 IMPLANT
SWAB COLLECTION DEVICE MRSA (MISCELLANEOUS) ×2 IMPLANT
SYR CONTROL 10ML LL (SYRINGE) ×2 IMPLANT
TOWEL OR 17X26 10 PK STRL BLUE (TOWEL DISPOSABLE) ×2 IMPLANT
TOWEL OR NON WOVEN STRL DISP B (DISPOSABLE) ×2 IMPLANT
UNDERPAD 30X30 (UNDERPADS AND DIAPERS) ×2 IMPLANT
YANKAUER SUCT BULB TIP 10FT TU (MISCELLANEOUS) ×2 IMPLANT

## 2017-03-01 NOTE — Anesthesia Postprocedure Evaluation (Signed)
Anesthesia Post Note  Patient: [name]  Procedure(s) Performed: IRRIGATION AND DEBRIDEMENT PERIRECTAL ABSCESS (N/A Rectum)     Patient location during evaluation: PACU Anesthesia Type: General Level of consciousness: awake and alert Pain management: pain level controlled Vital Signs Assessment: post-procedure vital signs reviewed and stable Respiratory status: spontaneous breathing, nonlabored ventilation, respiratory function stable and patient connected to nasal cannula oxygen Cardiovascular status: blood pressure returned to baseline and stable Postop Assessment: no apparent nausea or vomiting Anesthetic complications: no    Last Vitals:  Vitals:   03/01/17 2100 03/01/17 2200  BP:  105/64  Pulse: 95 (!) 102  Resp: (!) 8 10  Temp:    SpO2: 92% 98%    Last Pain:  Vitals:   03/01/17 2100  TempSrc:   PainSc: 2                  Jerah Esty

## 2017-03-01 NOTE — Progress Notes (Signed)
[facility] Office:  [phone] General Surgery Progress Note   LOS: 3 days  POD -     Chief Complaint: Buttocks pian  Assessment and Plan: 1.  Perirectal abscess  On Zosyn  Needs further debridement to open it up further.  To OR today  Discussed plan with patient - she is married, her husband is at work  2.  Diabetes - poorly managed  She was seeing Dr. Buddy Duty - but looking for another endocrinologist because of finances  3. Chronic diarrhea and weight loss  Etiology unclear 4.  Anxiety 5.  Anemia  Hgb - 8.2 - 03/01/2017 6.  Malnourished  Albumin - 1.4 - 03/01/2017 7.  Ascites/pleural effusion - seen on CT scan  Possibly based on poorly controlled diabetes and malnutrition   Principal Problem:   MDD (major depressive disorder), recurrent episode, mild (HCC) Active Problems:   Diabetes type 1, uncontrolled ([place])   Protein-calorie malnutrition, severe ([place])   DKA (diabetic ketoacidosis) (HCC)   Diarrhea   Lower GI bleed   Subjective:  Buttocks pain.  Discussed about going to the OR today.  Objective:   Vitals:   03/01/17 0700 03/01/17 0800  BP: (!) 96/55 106/67  Pulse: (!) 108 (!) 108  Resp: (!) 0 15  Temp:    SpO2: 92% 92%     Intake/Output from previous day:  12/01 0701 - 12/02 0700 In: 100 [IV Piggyback:100] Out: 2025 [Urine:2025]  Intake/Output this shift:  Total I/O In: -  Out: 150 [Urine:150]   Physical Exam:   General: Thin AA F who is alert and oriented.    HEENT: Normal. Pupils equal. .   Lungs: Clear   Abdomen: Soft   Wound: Tenderness and purlulence from 1.0 cm hole in right perianal area - surrounding flutulence - needs to be opened further - to OR today   Lab Results:    Recent Labs    02/28/17 0700 03/01/17 0344  WBC 8.6 8.4  HGB 8.1* 8.2*  HCT 23.8* 24.3*  PLT 177 149*    BMET   Recent Labs    02/28/17 0700 03/01/17 0344  NA 135 136  K 3.2* 3.6  CL 109 106  CO2 23 27  GLUCOSE 141* 75  BUN <5* <5*   CREATININE <0.30* 0.40*  CALCIUM 7.4* 7.6*    PT/INR   Recent Labs    02/27/17 0015  LABPROT 16.5*  INR 1.34    ABG   Recent Labs    02/26/17 1732  HCO3 3.6*     Studies/Results:  Ct Abdomen Pelvis W Contrast  Result Date: 02/28/2017 CLINICAL DATA:  Chronic diarrhea.  Weight loss. EXAM: CT ABDOMEN AND PELVIS WITH CONTRAST TECHNIQUE: Multidetector CT imaging of the abdomen and pelvis was performed using the standard protocol following bolus administration of intravenous contrast. CONTRAST:  <See Chart> ISOVUE-300 IOPAMIDOL (ISOVUE-300) INJECTION 61% COMPARISON:  01/15/2016 FINDINGS: Lower chest: Small bilateral pleural effusions are associated with by basilar collapse/ consolidation. Hepatobiliary: No focal abnormality within the liver parenchyma. Mild periportal edema noted. Gallbladder surgically absent. No intrahepatic or extrahepatic biliary dilation. Pancreas: No focal mass lesion. No dilatation of the main duct. No intraparenchymal cyst. No peripancreatic edema. Spleen: No splenomegaly. No focal mass lesion. Adrenals/Urinary Tract: No adrenal nodule or mass. Kidneys are unremarkable. No evidence for hydroureter. Urinary bladder is decompressed by Foley catheter but bladder wall appears thick despite the decompressed state. Stomach/Bowel: Stomach is nondistended. No gastric wall thickening. No evidence of outlet obstruction. Duodenum  is normally positioned as is the ligament of Treitz. No small bowel wall thickening. No small bowel dilatation. The terminal ileum is normal. The appendix is not visualized, but there is no edema or inflammation in the region of the cecum. No gross colonic mass. No colonic wall thickening. No substantial diverticular change. Vascular/Lymphatic: No abdominal aortic aneurysm. No abdominal aortic atherosclerotic calcification. Small gastrohepatic ligament lymph node without other lymphadenopathy in the abdomen. No pelvic sidewall lymphadenopathy. Reproductive:  Uterus unremarkable.  There is no adnexal mass. Other: Moderate volume ascites is associated with diffuse mesenteric retroperitoneal and body wall edema. Musculoskeletal: Complex perirectal abscess tracks posteriorly into the subcutaneous fat in then up behind the sacrum. The lesion tracks caudally in the subcutaneous fat deep to the right intergluteal fold towards the perineum but has been incompletely visualized in its lower extent. IMPRESSION: 1. Complex perirectal fluid collection tracks up behind the sacrum and down along the right intergluteal fold towards the perineum although inferior extent of the abscess has been incompletely visualized. 2. Moderate volume ascites with diffuse soft tissue and body wall edema. 3. Bilateral lower lung collapse/consolidation with small bilateral pleural effusions. Electronically Signed   By: Misty Stanley M.D.   On: 02/28/2017 13:56     Anti-infectives:   Anti-infectives (From admission, onward)   Start     Dose/Rate Route Frequency Ordered Stop   02/28/17 1300  piperacillin-tazobactam (ZOSYN) IVPB 3.375 g     3.375 g 12.5 mL/hr over 240 Minutes Intravenous Every 8 hours 02/28/17 1227        Alphonsa Overall, MD, FACS Pager: [facility] Office: [phone] 03/01/2017

## 2017-03-01 NOTE — Progress Notes (Signed)
PROGRESS NOTE  [name] RWE:[id number] DOB: 02-23-1991 DOA: 02/26/2017 PCP: [name], MD  HPI/Recap of past 24 hours:  Npo, to OR for debridement today   Assessment/Plan: Principal Problem:   MDD (major depressive disorder), recurrent episode, mild ([place]) Active Problems:   Diabetes type 1, uncontrolled ([place])   Protein-calorie malnutrition, severe ([place])   DKA (diabetic ketoacidosis) ([place])   Diarrhea   Lower GI bleed    DKA with metabolic encephalopathy and  severe acidosis venous blood gas pH 6.8 on presentation:  -She re received insulin drip IV fluids, required ICU admission -Improving, off insulin drip  Poorly controlled type 1 diabetes Medication noncompliance Patient reports not able to afford insulin, she reports one half brand insulin make her feel bloated. Diabetes educator and case manager consulted.  Hypoglycemia event on 12/1 pm due to inconsistent oral intake, decrease lantus from 15units to 8unit, continue ssi and hypoglycemia protocol  periRectal abscess  -CT imaging with complex fluids collection -started on zosyn on 12/1 -OR for debridement on 12/2,  -will follow general surgery recommendations  Acute on chronic anemia Hemoglobin 6.4 on admission, status post PRBC transfusion on November 30 B12 unremarkable, folate slightly low Start folic acid supplement  Chronic diarrhea//weight loss with anemia Report followed by GI Dr. Collene Mares Ct ab Carron Curie ordered GI consulted  Moisture associated skin damage around anal area  Likely from chronic diarrhea Wound care consulted  Hypokalemia /hypomagnesemia Replete k/mag  Anxiety/ depression: Psych input appreciated,    Code Status: full  Family Communication: patient   Disposition Plan: remain in stepdown   Consultants:   GI  General surgery  Critical care  Procedures:  Perirectal abscess debridement on 12/2  Antibiotics:  Zosyn from 12/1   Objective: BP 106/67   Pulse  (!) 108   Temp 98.6 F (37 C) (Oral)   Resp 15   Ht 5' 7"  (1.702 m)   Wt 52.2 kg (115 lb)   LMP 02/23/2017   SpO2 92%   BMI 18.01 kg/m   Intake/Output Summary (Last 24 hours) at 03/01/2017 0909 Last data filed at 03/01/2017 0800 Gross per 24 hour  Intake 100 ml  Output 2175 ml  Net -2075 ml   Filed Weights   02/26/17 1708  Weight: 52.2 kg (115 lb)    Exam: Patient is examined daily including today on 03/01/2017, exams remain the same as of yesterday except that has changed    General:  NAD, labile mood  Cardiovascular: slight sinus tachycardia  Respiratory: CTABL  Abdomen: Soft/ND/NT, positive BS  Musculoskeletal: No Edema, perirectal skin irritation  Neuro: alert, oriented   Data Reviewed: Basic Metabolic Panel: Recent Labs  Lab 02/26/17 2011 02/27/17 0015 02/27/17 0355 02/27/17 1136 02/28/17 0700 03/01/17 0344  NA 139 140 141  142 139 135 136  K 3.0* 3.1* 3.1*  3.1* 2.9* 3.2* 3.6  CL 116* 119* 120*  122* 117* 109 106  CO2 <7* 9* 15*  14* 17* 23 27  GLUCOSE 355* 203* 153*  153* 116* 141* 75  BUN 8 6 6  6  5* <5* <5*  CREATININE 0.82 0.73 0.53  0.50 0.36* <0.30* 0.40*  CALCIUM 7.9* 7.7* 7.6*  7.7* 7.5* 7.4* 7.6*  MG 1.9  --   --   --   --  1.5*  PHOS 2.9  --   --   --   --   --    Liver Function Tests: Recent Labs  Lab 02/26/17 1645 03/01/17 0344  AST 13* 14*  ALT 11* 7*  ALKPHOS 181* 106  BILITOT 2.1* 0.4  PROT 7.8 6.1*  ALBUMIN 2.2* 1.4*   No results for input(s): LIPASE, AMYLASE in the last 168 hours. No results for input(s): AMMONIA in the last 168 hours. CBC: Recent Labs  Lab 02/27/17 1200 02/27/17 1821 02/27/17 2348 02/28/17 0700 03/01/17 0344  WBC 12.0* 9.5 10.0 8.6 8.4  NEUTROABS 9.4* 7.4 7.1 5.2 5.6  HGB 8.7* 8.6* 9.0* 8.1* 8.2*  HCT 25.9* 25.4* 26.5* 23.8* 24.3*  MCV 91.8 90.7 91.4 91.2 92.0  PLT 197 173 167 177 149*   Cardiac Enzymes:   No results for input(s): CKTOTAL, CKMB, CKMBINDEX, TROPONINI in the last 168  hours. BNP (last 3 results) No results for input(s): BNP in the last 8760 hours.  ProBNP (last 3 results) No results for input(s): PROBNP in the last 8760 hours.  CBG: Recent Labs  Lab 02/28/17 1655 02/28/17 1731 02/28/17 1755 02/28/17 2145 03/01/17 0731  GLUCAP 69 54* 75 77 104*    Recent Results (from the past 240 hour(s))  Blood Culture (routine x 2)     Status: None (Preliminary result)   Collection Time: 02/26/17  4:45 PM  Result Value Ref Range Status   Specimen Description BLOOD RIGHT ANTECUBITAL  Final   Special Requests   Final    BOTTLES DRAWN AEROBIC AND ANAEROBIC Blood Culture adequate volume   Culture   Final    NO GROWTH 2 DAYS Performed at [facility], [place] [address] [place], [place] [address]    Report Status PENDING  Incomplete  Blood Culture (routine x 2)     Status: Abnormal   Collection Time: 02/26/17  4:55 PM  Result Value Ref Range Status   Specimen Description BLOOD LEFT ANTECUBITAL  Final   Special Requests   Final    BOTTLES DRAWN AEROBIC AND ANAEROBIC Blood Culture adequate volume   Culture  Setup Time   Final    ANAEROBIC BOTTLE ONLY CRITICAL RESULT CALLED TO, READ BACK BY AND VERIFIED WITH: B. GREEN PHARMD, AT 2333 02/27/17 BY D. VANHOOK CORRECTED RESULTS GRAM POSITIVE RODS PREVIOUSLY REPORTED AS: GRAM POSITIVE COCCI IN CHAINS CORRECTED RESULTS CALLED TO: PHARMD M BELL [id number] MLM    Culture (A)  Final    LACTOBACILLUS SPECIES Standardized susceptibility testing for this organism is not available. Performed at [facility], [place] [address], [place], [place] [address]    Report Status 03/01/2017 FINAL  Final  Blood Culture ID Panel (Reflexed)     Status: None   Collection Time: 02/26/17  4:55 PM  Result Value Ref Range Status   Enterococcus species NOT DETECTED NOT DETECTED Final   Listeria monocytogenes NOT DETECTED NOT DETECTED Final   Staphylococcus species NOT DETECTED NOT DETECTED Final   Staphylococcus aureus NOT  DETECTED NOT DETECTED Final   Streptococcus species NOT DETECTED NOT DETECTED Final   Streptococcus agalactiae NOT DETECTED NOT DETECTED Final   Streptococcus pneumoniae NOT DETECTED NOT DETECTED Final   Streptococcus pyogenes NOT DETECTED NOT DETECTED Final   Acinetobacter baumannii NOT DETECTED NOT DETECTED Final   Enterobacteriaceae species NOT DETECTED NOT DETECTED Final   Enterobacter cloacae complex NOT DETECTED NOT DETECTED Final   Escherichia coli NOT DETECTED NOT DETECTED Final   Klebsiella oxytoca NOT DETECTED NOT DETECTED Final   Klebsiella pneumoniae NOT DETECTED NOT DETECTED Final   Proteus species NOT DETECTED NOT DETECTED Final   Serratia marcescens NOT DETECTED NOT DETECTED  Final   Haemophilus influenzae NOT DETECTED NOT DETECTED Final   Neisseria meningitidis NOT DETECTED NOT DETECTED Final   Pseudomonas aeruginosa NOT DETECTED NOT DETECTED Final   Candida albicans NOT DETECTED NOT DETECTED Final   Candida glabrata NOT DETECTED NOT DETECTED Final   Candida krusei NOT DETECTED NOT DETECTED Final   Candida parapsilosis NOT DETECTED NOT DETECTED Final   Candida tropicalis NOT DETECTED NOT DETECTED Final    Comment: Performed at [facility], [place] [address], [place], [place] [address]  C difficile quick scan w PCR reflex     Status: Abnormal   Collection Time: 02/26/17  7:45 PM  Result Value Ref Range Status   C Diff antigen POSITIVE (A) NEGATIVE Final   C Diff toxin NEGATIVE NEGATIVE Final   C Diff interpretation Results are indeterminate. See PCR results.  Final  Culture, blood (x 2)     Status: None (Preliminary result)   Collection Time: 02/26/17 11:00 PM  Result Value Ref Range Status   Specimen Description BLOOD LEFT WRIST  Final   Special Requests   Final    BOTTLES DRAWN AEROBIC AND ANAEROBIC Blood Culture adequate volume   Culture   Final    NO GROWTH 1 DAY Performed at [facility], [place] [address], [place], [place] [address]    Report Status  PENDING  Incomplete  Urine culture     Status: None   Collection Time: 02/26/17 11:00 PM  Result Value Ref Range Status   Specimen Description URINE, RANDOM  Final   Special Requests NONE  Final   Culture   Final    NO GROWTH Performed at [facility], [place] [address], [place], [place] [address]    Report Status 02/28/2017 FINAL  Final  MRSA PCR Screening     Status: None   Collection Time: 02/26/17 11:00 PM  Result Value Ref Range Status   MRSA by PCR NEGATIVE NEGATIVE Final    Comment:        The GeneXpert MRSA Assay (FDA approved for NASAL specimens only), is one component of a comprehensive MRSA colonization surveillance program. It is not intended to diagnose MRSA infection nor to guide or monitor treatment for MRSA infections.   Culture, blood (x 2)     Status: None (Preliminary result)   Collection Time: 02/26/17 11:05 PM  Result Value Ref Range Status   Specimen Description BLOOD LEFT HAND  Final   Special Requests IN PEDIATRIC BOTTLE Blood Culture adequate volume  Final   Culture   Final    NO GROWTH 1 DAY Performed at [facility], [place] [address], [place], [place] [address]    Report Status PENDING  Incomplete     Studies: Ct Abdomen Pelvis W Contrast  Result Date: 02/28/2017 CLINICAL DATA:  Chronic diarrhea.  Weight loss. EXAM: CT ABDOMEN AND PELVIS WITH CONTRAST TECHNIQUE: Multidetector CT imaging of the abdomen and pelvis was performed using the standard protocol following bolus administration of intravenous contrast. CONTRAST:  <See Chart> ISOVUE-300 IOPAMIDOL (ISOVUE-300) INJECTION 61% COMPARISON:  01/15/2016 FINDINGS: Lower chest: Small bilateral pleural effusions are associated with by basilar collapse/ consolidation. Hepatobiliary: No focal abnormality within the liver parenchyma. Mild periportal edema noted. Gallbladder surgically absent. No intrahepatic or extrahepatic biliary dilation. Pancreas: No focal mass lesion. No dilatation of the main  duct. No intraparenchymal cyst. No peripancreatic edema. Spleen: No splenomegaly. No focal mass lesion. Adrenals/Urinary Tract: No adrenal nodule or mass. Kidneys are  unremarkable. No evidence for hydroureter. Urinary bladder is decompressed by Foley catheter but bladder wall appears thick despite the decompressed state. Stomach/Bowel: Stomach is nondistended. No gastric wall thickening. No evidence of outlet obstruction. Duodenum is normally positioned as is the ligament of Treitz. No small bowel wall thickening. No small bowel dilatation. The terminal ileum is normal. The appendix is not visualized, but there is no edema or inflammation in the region of the cecum. No gross colonic mass. No colonic wall thickening. No substantial diverticular change. Vascular/Lymphatic: No abdominal aortic aneurysm. No abdominal aortic atherosclerotic calcification. Small gastrohepatic ligament lymph node without other lymphadenopathy in the abdomen. No pelvic sidewall lymphadenopathy. Reproductive: Uterus unremarkable.  There is no adnexal mass. Other: Moderate volume ascites is associated with diffuse mesenteric retroperitoneal and body wall edema. Musculoskeletal: Complex perirectal abscess tracks posteriorly into the subcutaneous fat in then up behind the sacrum. The lesion tracks caudally in the subcutaneous fat deep to the right intergluteal fold towards the perineum but has been incompletely visualized in its lower extent. IMPRESSION: 1. Complex perirectal fluid collection tracks up behind the sacrum and down along the right intergluteal fold towards the perineum although inferior extent of the abscess has been incompletely visualized. 2. Moderate volume ascites with diffuse soft tissue and body wall edema. 3. Bilateral lower lung collapse/consolidation with small bilateral pleural effusions. Electronically Signed   By: Misty Stanley M.D.   On: 02/28/2017 13:56    Scheduled Meds: . insulin aspart  0-15 Units Subcutaneous  TID WC  . insulin aspart  3 Units Subcutaneous TID WC  . insulin glargine  8 Units Subcutaneous Daily    Continuous Infusions: . sodium chloride Stopped (02/26/17 2311)  . magnesium sulfate 1 - 4 g bolus IVPB    . piperacillin-tazobactam (ZOSYN)  IV 3.375 g (03/01/17 0533)     Time spent: 74mns,  I have personally reviewed and interpreted on  03/01/2017 daily labs, tele strips, imagings as discussed above under date review session and assessment and plans.  I reviewed all nursing notes, pharmacy notes, consultant notes,  vitals, pertinent old records  I have discussed plan of care as described above with RN , patient  on 03/01/2017   FFlorencia ReasonsMD, PhD  [facility] Pager [phone] If 7PM-7AM, please contact night-coverage at [email or web address], password [email or web address]12/05/2016, 9:09 AM  LOS: 3 days

## 2017-03-01 NOTE — Progress Notes (Signed)
Gastroenterology Progress Note covering for Dr. Loreta Ave  Chief Complaint:    Diarrhea, anemia  Subjective: Having a lot of rectal pain. Crying  Objective:  Vital signs in last 24 hours: Temp:  [98.1 F (36.7 C)-99.5 F (37.5 C)] 98.1 F (36.7 C) (12/02 1200) Pulse Rate:  [97-110] 106 (12/02 1530) Resp:  [0-22] 10 (12/02 1530) BP: (80-117)/(45-81) 113/80 (12/02 1400) SpO2:  [89 %-100 %] 93 % (12/02 1530) Last BM Date: 02/28/17 General:   Alert, thin black female in NAD EENT:  Normal hearing, non icteric sclera, conjunctive pink.  Heart:  Regular rate and rhythm; no murmurs. No lower extremity edema Pulm: Normal respiratory effort Abdomen:  Soft, nondistended, nontender.  Normal bowel sounds, no masses felt. No hepatomegaly.    Neurologic:  Alert and  oriented x4;  grossly normal neurologically. Psych:  Pleasant, cooperative.  Normal mood and affect.   Intake/Output from previous day: 12/01 0701 - 12/02 0700 In: 100 [IV Piggyback:100] Out: 2025 [Urine:2025] Intake/Output this shift: Total I/O In: 850 [I.V.:500; Other:250; IV Piggyback:100] Out: 150 [Urine:150]  Lab Results: Recent Labs    02/27/17 2348 02/28/17 0700 03/01/17 0344  WBC 10.0 8.6 8.4  HGB 9.0* 8.1* 8.2*  HCT 26.5* 23.8* 24.3*  PLT 167 177 149*   BMET Recent Labs    02/27/17 1136 02/28/17 0700 03/01/17 0344  NA 139 135 136  K 2.9* 3.2* 3.6  CL 117* 109 106  CO2 17* 23 27  GLUCOSE 116* 141* 75  BUN 5* <5* <5*  CREATININE 0.36* <0.30* 0.40*  CALCIUM 7.5* 7.4* 7.6*   LFT Recent Labs    03/01/17 0344  PROT 6.1*  ALBUMIN 1.4*  AST 14*  ALT 7*  ALKPHOS 106  BILITOT 0.4   PT/INR Recent Labs    02/27/17 0015  LABPROT 16.5*  INR 1.34    Ct Abdomen Pelvis W Contrast  Result Date: 02/28/2017 CLINICAL DATA:  Chronic diarrhea.  Weight loss. EXAM: CT ABDOMEN AND PELVIS WITH CONTRAST TECHNIQUE: Multidetector CT imaging of the abdomen and pelvis was performed using the standard protocol  following bolus administration of intravenous contrast. CONTRAST:  <See Chart> ISOVUE-300 IOPAMIDOL (ISOVUE-300) INJECTION 61% COMPARISON:  01/15/2016 FINDINGS: Lower chest: Small bilateral pleural effusions are associated with by basilar collapse/ consolidation. Hepatobiliary: No focal abnormality within the liver parenchyma. Mild periportal edema noted. Gallbladder surgically absent. No intrahepatic or extrahepatic biliary dilation. Pancreas: No focal mass lesion. No dilatation of the main duct. No intraparenchymal cyst. No peripancreatic edema. Spleen: No splenomegaly. No focal mass lesion. Adrenals/Urinary Tract: No adrenal nodule or mass. Kidneys are unremarkable. No evidence for hydroureter. Urinary bladder is decompressed by Foley catheter but bladder wall appears thick despite the decompressed state. Stomach/Bowel: Stomach is nondistended. No gastric wall thickening. No evidence of outlet obstruction. Duodenum is normally positioned as is the ligament of Treitz. No small bowel wall thickening. No small bowel dilatation. The terminal ileum is normal. The appendix is not visualized, but there is no edema or inflammation in the region of the cecum. No gross colonic mass. No colonic wall thickening. No substantial diverticular change. Vascular/Lymphatic: No abdominal aortic aneurysm. No abdominal aortic atherosclerotic calcification. Small gastrohepatic ligament lymph node without other lymphadenopathy in the abdomen. No pelvic sidewall lymphadenopathy. Reproductive: Uterus unremarkable.  There is no adnexal mass. Other: Moderate volume ascites is associated with diffuse mesenteric retroperitoneal and body wall edema. Musculoskeletal: Complex perirectal abscess tracks posteriorly into the subcutaneous fat in then up behind the sacrum. The lesion tracks caudally  in the subcutaneous fat deep to the right intergluteal fold towards the perineum but has been incompletely visualized in its lower extent. IMPRESSION: 1.  Complex perirectal fluid collection tracks up behind the sacrum and down along the right intergluteal fold towards the perineum although inferior extent of the abscess has been incompletely visualized. 2. Moderate volume ascites with diffuse soft tissue and body wall edema. 3. Bilateral lower lung collapse/consolidation with small bilateral pleural effusions. Electronically Signed   By: Kennith Center M.D.   On: 02/28/2017 13:56    ASSESSMENT / PLAN:   1. 25 yo female with chronic diarrhea / weight loss. Followed outpatient by Dr. Loreta Ave for the diarrhea. Stool pathogen panel negative. C-diff ag positive but antigen is negative.  -if stool studies negative she may need outpatient colonoscopy with Dr. Loreta Ave for evaluation of weight loss, chronic diarrhea and minor rectal bleeding.   2. Normocytic anemia. Anemia panel doesn't suggest IDA.  Probably multifactorial (menses, nutritional ). Her folate is low. Hgb down from 12 in Sept to mid 6 range this admission. Remains stable ~ 8.2 since getting a unit of blood 2 days ago. Some minor rectal bleeding this admission.   3. Perianal abscess, s/p I+D today. On Zosyn.   4. Ascites / generalized body well edema.. No liver abnormalities on CT scan. Her albumin is 1.4.   Dr. Loreta Ave / Dr. Elnoria Howard will resume her care tomorrow.    Principal Problem:   MDD (major depressive disorder), recurrent episode, mild ([facility]) Active Problems:   Diabetes type 1, uncontrolled ([facility])   Protein-calorie malnutrition, severe ([facility])   DKA (diabetic ketoacidosis) ([facility])   Diarrhea   Lower GI bleed   LOS: 3 days   Willette Cluster ,NP 03/01/2017, 3:36 PM  Pager number [phone]    Attending physician's note   I have taken an interval history, reviewed the chart and examined the patient. I agree with the Advanced Practitioner's note, impression and recommendations.   Chronic diarrhea - awaiting records from outpatient evaluation Anemia, likely chronic disease related Weight  loss, likely due to poorly controlled DM Poorly controlled DM, resolving DKA Right sided perirectal abscess, S/P I&D and IV Zosyn  Ascites and body wall edema noted on CT. Albumin=1.4. No history of liver disease or liver findings on CT. Consider paracentesis for further evaluation     Drs. Mann/Hung to resume care on Monday.   Claudette Head, MD [facility] Graham [phone] Mon-Fri 8a-5p [phone] after 5p, weekends, holidays

## 2017-03-01 NOTE — Anesthesia Preprocedure Evaluation (Signed)
Anesthesia Evaluation  Patient identified by MRN, date of birth, ID band Patient awake    Reviewed: Allergy & Precautions, NPO status , Patient's Chart, lab work & pertinent test results  History of Anesthesia Complications Negative for: history of anesthetic complications  Airway Mallampati: I  TM Distance: >3 FB Neck ROM: Full    Dental  (+) Teeth Intact   Pulmonary neg pulmonary ROS,    breath sounds clear to auscultation       Cardiovascular hypertension,  Rhythm:Regular     Neuro/Psych PSYCHIATRIC DISORDERS Anxiety Depression negative neurological ROS     GI/Hepatic Neg liver ROS, Persistent diarrhea (c diff precautions)   Endo/Other  diabetes, Poorly Controlled  Renal/GU negative Renal ROS     Musculoskeletal  (+) Fibromyalgia -  Abdominal   Peds  Hematology negative hematology ROS (+)   Anesthesia Other Findings Poor nutritional status with low albumin, NPO 0400, denies N/V currently. Nasal ring  Reproductive/Obstetrics                             Anesthesia Physical Anesthesia Plan  ASA: II  Anesthesia Plan: General   Post-op Pain Management:    Induction: Intravenous  PONV Risk Score and Plan: 2 and Ondansetron and Dexamethasone  Airway Management Planned: LMA  Additional Equipment: None  Intra-op Plan:   Post-operative Plan: Extubation in OR  Informed Consent: I have reviewed the patients History and Physical, chart, labs and discussed the procedure including the risks, benefits and alternatives for the proposed anesthesia with the patient or authorized representative who has indicated his/her understanding and acceptance.   Dental advisory given  Plan Discussed with: CRNA and Surgeon  Anesthesia Plan Comments:         Anesthesia Quick Evaluation

## 2017-03-01 NOTE — Transfer of Care (Signed)
Immediate Anesthesia Transfer of Care Note  Patient: [name]  Procedure(s) Performed: IRRIGATION AND DEBRIDEMENT PERIRECTAL ABSCESS (N/A Rectum)  Patient Location: PACU  Anesthesia Type:General  Level of Consciousness: awake, alert  and oriented  Airway & Oxygen Therapy: Patient Spontanous Breathing and Patient connected to nasal cannula oxygen  Post-op Assessment: Report given to RN and Post -op Vital signs reviewed and stable  Post vital signs: Reviewed and stable  Last Vitals:  Vitals:   03/01/17 0700 03/01/17 0800  BP: (!) 96/55 106/67  Pulse: (!) 108 (!) 108  Resp: (!) 0 15  Temp:  37 C  SpO2: 92% 92%    Last Pain:  Vitals:   03/01/17 0800  TempSrc: Oral  PainSc:       Patients Stated Pain Goal: 2 (03/01/17 3557)  Complications: No apparent anesthesia complications

## 2017-03-01 NOTE — Anesthesia Procedure Notes (Signed)
Procedure Name: LMA Insertion Date/Time: 03/01/2017 10:28 AM Performed by: Kaedon Fanelli D, CRNA Pre-anesthesia Checklist: Patient identified, Emergency Drugs available, Suction available and Patient being monitored Patient Re-evaluated:Patient Re-evaluated prior to induction Oxygen Delivery Method: Circle system utilized Preoxygenation: Pre-oxygenation with 100% oxygen Induction Type: IV induction Ventilation: Mask ventilation without difficulty LMA: LMA inserted LMA Size: 4.0 Tube type: Oral Number of attempts: 1 Placement Confirmation: positive ETCO2 and breath sounds checked- equal and bilateral Tube secured with: Tape Dental Injury: Teeth and Oropharynx as per pre-operative assessment

## 2017-03-01 NOTE — Op Note (Addendum)
02/26/2017 - 03/01/2017  40:97 AM  PATIENT:  [name], 26 y.o., female, MRN: [id number]  PREOP DIAGNOSIS:  peri rectal abscess  POSTOP DIAGNOSIS:   Right peri rectal abscess tracking over her sacrum  PROCEDURE:   Procedure(s): IRRIGATION AND DEBRIDEMENT PERIRECTAL ABSCESS  SURGEON:   Alphonsa Overall, M.D.  ASSISTANT:   None  ANESTHESIA:   general  Anesthesiologist: Oleta Mouse, MD CRNA: Marijo Conception, CRNA  General  EBL:  75  ml  BLOOD ADMINISTERED: none  DRAINS: none   LOCAL MEDICATIONS USED:   30 cc of 1/4% Marcaine  SPECIMEN:   Cultures obtained  COUNTS CORRECT:  YES  INDICATIONS FOR PROCEDURE:  [name] is a 26 y.o. (DOB: 06/30/1990) AA female whose primary care physician is [name], MD.   She was admitted to [facility] on 26 February 2017 for poorly controlled diabetes and perirectal pain.  Her CT scan on 28 February 2017 showed a complex perirectal fluid collection tracking behind the sacrum and along the right intergluteal folds.  Dr. Hassell Done saw her in consult on 28 February 2017.  There are no draining, I think this perirectal abscess needs further debridement.   The indications and risks of the surgery were explained to the patient.  The risks include, but are not limited to, infection, bleeding, and nerve injury.  PROCEDURE:  The patient was taken to room #1 at [facility].  She underwent a general anesthesia.  She was placed in lithotomy position.   Timeout was held and the surgical check list run.   The perineum was prepped with Betadine.  She had a punctate wound to the right and posterior to her anus.   I obtained cultures for aerobes and anaerobes.  I then made an incision about 12 cm long tracking along her right intergluteal cleft.  There was a lot of necrotic tissue in the wound.   I irrigated the wound with 800 cc of saline.  I packed the wound with 4 inch Kerlix soaked in saline.   If she is returned to  the OR, she may need to be in a lateral or prone position to get to the upper most part of the abscess cavity.   The wound was sterilely dressed and the patient transported to recovery room in good condition.  Alphonsa Overall, MD, [facility] Surgery Pager: [phone] Office phone:  [phone]'

## 2017-03-02 ENCOUNTER — Encounter (HOSPITAL_COMMUNITY): Payer: Self-pay | Admitting: Surgery

## 2017-03-02 LAB — CULTURE, BLOOD (ROUTINE X 2): Special Requests: ADEQUATE

## 2017-03-02 LAB — GASTROINTESTINAL PANEL BY PCR, STOOL (REPLACES STOOL CULTURE)

## 2017-03-02 LAB — BASIC METABOLIC PANEL
Anion gap: 3 — ABNORMAL LOW (ref 5–15)
BUN: <5 mg/dL — ABNORMAL LOW (ref 6–20)
CO2: 27 mmol/L (ref 22–32)
Calcium: 7.2 mg/dL — ABNORMAL LOW (ref 8.9–10.3)
Chloride: 107 mmol/L (ref 101–111)
Creatinine, Ser: <0.3 mg/dL — ABNORMAL LOW (ref 0.44–1.00)
Glucose, Bld: 68 mg/dL (ref 65–99)
Potassium: 3.4 mmol/L — ABNORMAL LOW (ref 3.5–5.1)
Sodium: 137 mmol/L (ref 135–145)

## 2017-03-02 LAB — CBC
HCT: 21.6 % — ABNORMAL LOW (ref 36.0–46.0)
Hemoglobin: 7.2 g/dL — ABNORMAL LOW (ref 12.0–15.0)
MCH: 31.2 pg (ref 26.0–34.0)
MCHC: 33.3 g/dL (ref 30.0–36.0)
MCV: 93.5 fL (ref 78.0–100.0)
Platelets: 157 10*3/uL (ref 150–400)
RBC: 2.31 MIL/uL — ABNORMAL LOW (ref 3.87–5.11)
RDW: 15.1 % (ref 11.5–15.5)
WBC: 7.6 10*3/uL (ref 4.0–10.5)

## 2017-03-02 LAB — GLUCOSE, CAPILLARY
Glucose-Capillary: 41 mg/dL — CL (ref 65–99)
Glucose-Capillary: 28 mg/dL — CL (ref 65–99)
Glucose-Capillary: 75 mg/dL (ref 65–99)
Glucose-Capillary: 205 mg/dL — ABNORMAL HIGH (ref 65–99)
Glucose-Capillary: 308 mg/dL — ABNORMAL HIGH (ref 65–99)
Glucose-Capillary: 255 mg/dL — ABNORMAL HIGH (ref 65–99)

## 2017-03-02 LAB — PROCALCITONIN: Procalcitonin: <0.1 ng/mL

## 2017-03-02 LAB — MAGNESIUM: Magnesium: 1.9 mg/dL (ref 1.7–2.4)

## 2017-03-02 MED ORDER — CHOLESTYRAMINE 4 G PO PACK
4.0000 g | PACK | ORAL | Status: DC
  Filled 2017-03-02 (×2): qty 1

## 2017-03-02 MED ORDER — VANCOMYCIN 50 MG/ML ORAL SOLUTION
125.0000 mg | Freq: Four times a day (QID) | ORAL | Status: DC
  Administered 2017-03-02 – 2017-03-05 (×12): 125 mg via ORAL
  Filled 2017-03-02 (×13): qty 2.5

## 2017-03-02 MED ORDER — INSULIN ASPART 100 UNIT/ML ~~LOC~~ SOLN
0.0000 [IU] | Freq: Three times a day (TID) | SUBCUTANEOUS | Status: DC
  Administered 2017-03-02: 7 [IU] via SUBCUTANEOUS
  Administered 2017-03-03: 2 [IU] via SUBCUTANEOUS
  Administered 2017-03-03 – 2017-03-04 (×2): 1 [IU] via SUBCUTANEOUS
  Administered 2017-03-04 (×2): 5 [IU] via SUBCUTANEOUS
  Administered 2017-03-05: 2 [IU] via SUBCUTANEOUS
  Administered 2017-03-05: 1 [IU] via SUBCUTANEOUS
  Administered 2017-03-05: 2 [IU] via SUBCUTANEOUS
  Administered 2017-03-06 (×3): 3 [IU] via SUBCUTANEOUS
  Administered 2017-03-07 – 2017-03-09 (×4): 2 [IU] via SUBCUTANEOUS
  Administered 2017-03-09: 1 [IU] via SUBCUTANEOUS
  Administered 2017-03-10: 2 [IU] via SUBCUTANEOUS
  Administered 2017-03-11: 3 [IU] via SUBCUTANEOUS
  Administered 2017-03-11: 7 [IU] via SUBCUTANEOUS
  Administered 2017-03-12 (×3): 1 [IU] via SUBCUTANEOUS
  Administered 2017-03-13: 3 [IU] via SUBCUTANEOUS
  Administered 2017-03-13: 1 [IU] via SUBCUTANEOUS
  Administered 2017-03-13: 7 [IU] via SUBCUTANEOUS
  Administered 2017-03-14 (×2): 1 [IU] via SUBCUTANEOUS

## 2017-03-02 MED ORDER — INSULIN GLARGINE 100 UNIT/ML ~~LOC~~ SOLN
8.0000 [IU] | Freq: Once | SUBCUTANEOUS | Status: AC
  Administered 2017-03-02: 8 [IU] via SUBCUTANEOUS
  Filled 2017-03-02: qty 0.08

## 2017-03-02 MED ORDER — DEXTROSE 50 % IV SOLN
INTRAVENOUS | Status: AC
  Administered 2017-03-02: 50 mL
  Filled 2017-03-02: qty 50

## 2017-03-02 MED ORDER — GLUCERNA PO LIQD: 237.0000 mL | Freq: Three times a day (TID) | ORAL | Status: DC

## 2017-03-02 MED ORDER — CITALOPRAM HYDROBROMIDE 20 MG PO TABS
10.0000 mg | ORAL_TABLET | Freq: Every day | ORAL | Status: AC
  Administered 2017-03-02 – 2017-03-08 (×7): 10 mg via ORAL
  Filled 2017-03-02 (×7): qty 1

## 2017-03-02 MED ORDER — POTASSIUM CHLORIDE CRYS ER 20 MEQ PO TBCR
40.0000 meq | EXTENDED_RELEASE_TABLET | Freq: Once | ORAL | Status: AC
  Administered 2017-03-02: 40 meq via ORAL
  Filled 2017-03-02: qty 2

## 2017-03-02 MED ORDER — ENSURE ENLIVE PO LIQD
237.0000 mL | Freq: Three times a day (TID) | ORAL | Status: DC
  Administered 2017-03-02 (×2): 237 mL via ORAL

## 2017-03-02 MED ORDER — GLUCERNA SHAKE PO LIQD
237.0000 mL | Freq: Three times a day (TID) | ORAL | Status: DC
  Administered 2017-03-03: 237 mL via ORAL
  Filled 2017-03-02 (×2): qty 237

## 2017-03-02 NOTE — Progress Notes (Signed)
Subjective: Patient seen and examined. Chart reviewed. Patient does not remember the details around the time of her admission. She has been having severe diarrhea all day and has a flexiseal in place. She has been very non-compliant with her medical care and has missed her last 3 appointments at our office. She denies having any nausea, vomiting or abdominal pain. Her rectal pain improved after the I & D. She has had some severe hypoglycemic episodes over the last 24 hours.    Objective: Vital signs in last 24 hours: Temp:  [97.5 F (36.4 C)-98.8 F (37.1 C)] 97.6 F (36.4 C) (12/03 1600) Pulse Rate:  [87-105] 103 (12/03 1700) Resp:  [0-22] 12 (12/03 1700) BP: (82-118)/(50-75) 95/53 (12/03 1600) SpO2:  [85 %-99 %] 99 % (12/03 1700) Last BM Date: 02/28/17  Intake/Output from previous day: 12/02 0701 - 12/03 0700 In: 3517.5 [I.V.:3017.5; IV Piggyback:250] Out: 750 [Urine:750] Intake/Output this shift: Total I/O In: 1550 [I.V.:1500; IV Piggyback:50] Out: 350 [Urine:350]  General appearance: alert, cooperative, appears stated age, no distress and pale Resp: clear to auscultation bilaterally Cardio: regular rate and rhythm, S1, S2 normal, no murmur, click, rub or gallop GI: soft, non-tender; bowel sounds normal; no masses,  no organomegaly Extremities: extremities normal, atraumatic, no cyanosis or edema  Lab Results: Recent Labs    02/28/17 0700 03/01/17 0344 03/02/17 0346  WBC 8.6 8.4 7.6  HGB 8.1* 8.2* 7.2*  HCT 23.8* 24.3* 21.6*  PLT 177 149* 157   BMET Recent Labs    02/28/17 0700 03/01/17 0344 03/02/17 0346  NA 135 136 137  K 3.2* 3.6 3.4*  CL 109 106 107  CO2 23 27 27   GLUCOSE 141* 75 68  BUN <5* <5* <5*  CREATININE <0.30* 0.40* <0.30*  CALCIUM 7.4* 7.6* 7.2*   LFT Recent Labs    03/01/17 0344  PROT 6.1*  ALBUMIN 1.4*  AST 14*  ALT 7*  ALKPHOS 106  BILITOT 0.4   Medications: I have reviewed the patient's current medications.  Assessment/Plan: 1)  DKA  With metabolic encephalopathy with severe acidosis-improved with insulin drip. 2) Poorly controlled Type 1 DM.  3) Perirectal abscess-s/p I & D on Zosyn.  4) Acute on chronic anemia.  5) Severe diarrhea on Vancomycin now.  6) Severe malnutrition-albumin 1.4-patient needs nutrition consult with diabetes education. 7) Severe depression and anxiety. 8) Medical non-compliance.  LOS: 4 days   [name] 03/02/2017, 5:38 PM

## 2017-03-02 NOTE — Consult Note (Signed)
WOC Nurse wound consult note Reason for Consult:Consulted for perirectal wound simultaneous to CCS.  Dr. Ezzard Standing performed surgical procedure and that service is following. Wound type:Infectious WOC nursing team will not follow, but will remain available to this patient, the nursing and medical teams.  Please re-consult if needed. Thanks, Ladona Mow, MSN, RN, GNP, [place]  Pager# [phone]

## 2017-03-02 NOTE — Progress Notes (Addendum)
PROGRESS NOTE  [name] JYN:[id number] DOB: 1990-11-27 DOA: 02/26/2017 PCP: [name], MD  HPI/Recap of past 24 hours:  s/p debridement on 12/2 Hypoglycemia event  Watery stool in rectal tube   Assessment/Plan: Principal Problem:   MDD (major depressive disorder), recurrent episode, mild ([place]) Active Problems:   Diabetes type 1, uncontrolled ([place])   Protein-calorie malnutrition, severe ([place])   DKA (diabetic ketoacidosis) ([place])   Diarrhea   Lower GI bleed    DKA with metabolic encephalopathy and  severe acidosis venous blood gas pH 6.8 on presentation:  -She re received insulin drip IV fluids, required ICU admission -Improving, off insulin drip  Poorly controlled type 1 diabetes -H/o Medication noncompliance -Patient reports not able to afford insulin, she reports one half brand insulin make her feel bloated. -a1c 12.2 -Diabetes educator and case manager consulted.  Hypoglycemia event  ? due to inconsistent oral intake,   hypoglycemia protocol Continue adjust insulin dose   periRectal abscess  -CT imaging with complex fluids collection -OR for debridement on 12/2, started on zosyn on 12/1, surgical culture result pending -will follow general surgery recommendations  Acute on chronic anemia -Hemoglobin 6.4 on admission, status post PRBC transfusionx1 on November 30 -B12 unremarkable, folate slightly low, Start folic acid supplement -transfuse prn to keep hgb >7.  Chronic diarrhea//weight loss with anemia/malnutrition -Report followed by GI Dr. Collene Mares -Ct ab [place] + abscess and ascites but no bowel wall thickening -GI ordered c diff which showed antigen positive, toxin negative. Due to chronic debilitating diarrhea will start oral vanc. Will also start questran   Moisture associated skin damage around anal area  Likely from chronic diarrhea Wound care consulted  Hypokalemia /hypomagnesemia Replete k/mag  Malnutrition. moderate: CT ab/pel"  Moderate volume ascites with diffuse soft tissue and body wall edema." Albumin 1.4 Nutrition consulted  Anxiety/ depression: Psych input appreciated,    Code Status: full  Family Communication: patient   Disposition Plan: patient's blood pressure remain borderline, will keep in stepdown unit today   Consultants:   GI  General surgery  Critical care  Procedures:  Perirectal abscess debridement on 12/2  Antibiotics:  Zosyn from 12/1   Objective: BP 108/75   Pulse 96   Temp 98.1 F (36.7 C) (Oral)   Resp 11   Ht 5' 7"  (1.702 m)   Wt 52.2 kg (115 lb)   LMP 02/23/2017   SpO2 96%   BMI 18.01 kg/m   Intake/Output Summary (Last 24 hours) at 03/02/2017 0757 Last data filed at 03/02/2017 0500 Gross per 24 hour  Intake 3517.5 ml  Output 750 ml  Net 2767.5 ml   Filed Weights   02/26/17 1708  Weight: 52.2 kg (115 lb)    Exam: Patient is examined daily including today on 03/02/2017, exams remain the same as of yesterday except that has changed    General:  NAD, labile mood, does not make good eye contact during conversation, she replies  "I do not remember " to most questions.   Cardiovascular: slight sinus tachycardia  Respiratory: CTABL  Abdomen: Soft/ND/NT, positive BS  Musculoskeletal: No Edema, perirectal skin irritation  Neuro: alert, oriented   Data Reviewed: Basic Metabolic Panel: Recent Labs  Lab 02/26/17 2011  02/27/17 0355 02/27/17 1136 02/28/17 0700 03/01/17 0344 03/02/17 0346  NA 139   < > 141  142 139 135 136 137  K 3.0*   < > 3.1*  3.1* 2.9* 3.2* 3.6 3.4*  CL 116*   < >  120*  122* 117* 109 106 107  CO2 <7*   < > 15*  14* 17* 23 27 27   GLUCOSE 355*   < > 153*  153* 116* 141* 75 68  BUN 8   < > 6  6 5* <5* <5* <5*  CREATININE 0.82   < > 0.53  0.50 0.36* <0.30* 0.40* <0.30*  CALCIUM 7.9*   < > 7.6*  7.7* 7.5* 7.4* 7.6* 7.2*  MG 1.9  --   --   --   --  1.5* 1.9  PHOS 2.9  --   --   --   --   --   --    < > = values in  this interval not displayed.   Liver Function Tests: Recent Labs  Lab 02/26/17 1645 03/01/17 0344  AST 13* 14*  ALT 11* 7*  ALKPHOS 181* 106  BILITOT 2.1* 0.4  PROT 7.8 6.1*  ALBUMIN 2.2* 1.4*   No results for input(s): LIPASE, AMYLASE in the last 168 hours. No results for input(s): AMMONIA in the last 168 hours. CBC: Recent Labs  Lab 02/27/17 1200 02/27/17 1821 02/27/17 2348 02/28/17 0700 03/01/17 0344 03/02/17 0346  WBC 12.0* 9.5 10.0 8.6 8.4 7.6  NEUTROABS 9.4* 7.4 7.1 5.2 5.6  --   HGB 8.7* 8.6* 9.0* 8.1* 8.2* 7.2*  HCT 25.9* 25.4* 26.5* 23.8* 24.3* 21.6*  MCV 91.8 90.7 91.4 91.2 92.0 93.5  PLT 197 173 167 177 149* 157   Cardiac Enzymes:   No results for input(s): CKTOTAL, CKMB, CKMBINDEX, TROPONINI in the last 168 hours. BNP (last 3 results) No results for input(s): BNP in the last 8760 hours.  ProBNP (last 3 results) No results for input(s): PROBNP in the last 8760 hours.  CBG: Recent Labs  Lab 03/01/17 0731 03/01/17 1126 03/01/17 1600 03/01/17 2105 03/02/17 0543  GLUCAP 104* 174* 218* 185* 41*    Recent Results (from the past 240 hour(s))  Blood Culture (routine x 2)     Status: None (Preliminary result)   Collection Time: 02/26/17  4:45 PM  Result Value Ref Range Status   Specimen Description BLOOD RIGHT ANTECUBITAL  Final   Special Requests   Final    BOTTLES DRAWN AEROBIC AND ANAEROBIC Blood Culture adequate volume   Culture   Final    NO GROWTH 3 DAYS Performed at [facility], [place] [address] [place], [place] [address]    Report Status PENDING  Incomplete  Blood Culture (routine x 2)     Status: Abnormal (Preliminary result)   Collection Time: 02/26/17  4:55 PM  Result Value Ref Range Status   Specimen Description BLOOD LEFT ANTECUBITAL  Final   Special Requests   Final    BOTTLES DRAWN AEROBIC AND ANAEROBIC Blood Culture adequate volume   Culture  Setup Time   Final    IN BOTH AEROBIC AND ANAEROBIC BOTTLES CRITICAL RESULT  CALLED TO, READ BACK BY AND VERIFIED WITH: B. GREEN PHARMD, AT 2333 02/27/17 BY D. VANHOOK CORRECTED RESULTS GRAM POSITIVE RODS PREVIOUSLY REPORTED AS: GRAM POSITIVE COCCI IN CHAINS CORRECTED RESULTS CALLED TO: PHARMD M BELL [id number] MLM    Culture (A)  Final    LACTOBACILLUS SPECIES Standardized susceptibility testing for this organism is not available. Performed at [facility], [place] [address], [place], [place] [address]    Report Status PENDING  Incomplete  Blood Culture ID Panel (Reflexed)     Status: None   Collection Time:  02/26/17  4:55 PM  Result Value Ref Range Status   Enterococcus species NOT DETECTED NOT DETECTED Final   Listeria monocytogenes NOT DETECTED NOT DETECTED Final   Staphylococcus species NOT DETECTED NOT DETECTED Final   Staphylococcus aureus NOT DETECTED NOT DETECTED Final   Streptococcus species NOT DETECTED NOT DETECTED Final   Streptococcus agalactiae NOT DETECTED NOT DETECTED Final   Streptococcus pneumoniae NOT DETECTED NOT DETECTED Final   Streptococcus pyogenes NOT DETECTED NOT DETECTED Final   Acinetobacter baumannii NOT DETECTED NOT DETECTED Final   Enterobacteriaceae species NOT DETECTED NOT DETECTED Final   Enterobacter cloacae complex NOT DETECTED NOT DETECTED Final   Escherichia coli NOT DETECTED NOT DETECTED Final   Klebsiella oxytoca NOT DETECTED NOT DETECTED Final   Klebsiella pneumoniae NOT DETECTED NOT DETECTED Final   Proteus species NOT DETECTED NOT DETECTED Final   Serratia marcescens NOT DETECTED NOT DETECTED Final   Haemophilus influenzae NOT DETECTED NOT DETECTED Final   Neisseria meningitidis NOT DETECTED NOT DETECTED Final   Pseudomonas aeruginosa NOT DETECTED NOT DETECTED Final   Candida albicans NOT DETECTED NOT DETECTED Final   Candida glabrata NOT DETECTED NOT DETECTED Final   Candida krusei NOT DETECTED NOT DETECTED Final   Candida parapsilosis NOT DETECTED NOT DETECTED Final   Candida tropicalis NOT DETECTED NOT  DETECTED Final    Comment: Performed at [facility], [place] [address] [place], [place] [address]  C difficile quick scan w PCR reflex     Status: Abnormal   Collection Time: 02/26/17  7:45 PM  Result Value Ref Range Status   C Diff antigen POSITIVE (A) NEGATIVE Final   C Diff toxin NEGATIVE NEGATIVE Final   C Diff interpretation Results are indeterminate. See PCR results.  Final  Culture, blood (x 2)     Status: None (Preliminary result)   Collection Time: 02/26/17 11:00 PM  Result Value Ref Range Status   Specimen Description BLOOD LEFT WRIST  Final   Special Requests   Final    BOTTLES DRAWN AEROBIC AND ANAEROBIC Blood Culture adequate volume   Culture   Final    NO GROWTH 2 DAYS Performed at [facility], [address], [place], [place] [address]    Report Status PENDING  Incomplete  Urine culture     Status: None   Collection Time: 02/26/17 11:00 PM  Result Value Ref Range Status   Specimen Description URINE, RANDOM  Final   Special Requests NONE  Final   Culture   Final    NO GROWTH Performed at [facility], [place] [address], [place], [place] [address]    Report Status 02/28/2017 FINAL  Final  MRSA PCR Screening     Status: None   Collection Time: 02/26/17 11:00 PM  Result Value Ref Range Status   MRSA by PCR NEGATIVE NEGATIVE Final    Comment:        The GeneXpert MRSA Assay (FDA approved for NASAL specimens only), is one component of a comprehensive MRSA colonization surveillance program. It is not intended to diagnose MRSA infection nor to guide or monitor treatment for MRSA infections.   Culture, blood (x 2)     Status: None (Preliminary result)   Collection Time: 02/26/17 11:05 PM  Result Value Ref Range Status   Specimen Description BLOOD LEFT HAND  Final   Special Requests IN PEDIATRIC BOTTLE Blood Culture adequate volume  Final   Culture   Final    NO GROWTH  2 DAYS Performed at [facility], [place] [address], [place], [place]  [address]    Report Status PENDING  Incomplete  Gastrointestinal Panel by PCR , Stool     Status: None   Collection Time: 02/27/17  8:49 AM  Result Value Ref Range Status   Campylobacter species NOT DETECTED NOT DETECTED Final   Plesimonas shigelloides NOT DETECTED NOT DETECTED Final   Salmonella species NOT DETECTED NOT DETECTED Final   Yersinia enterocolitica NOT DETECTED NOT DETECTED Final   Vibrio species NOT DETECTED NOT DETECTED Final   Vibrio cholerae NOT DETECTED NOT DETECTED Final   Enteroaggregative E coli (EAEC) NOT DETECTED NOT DETECTED Final   Enteropathogenic E coli (EPEC) NOT DETECTED NOT DETECTED Final   Enterotoxigenic E coli (ETEC) NOT DETECTED NOT DETECTED Final   Shiga like toxin producing E coli (STEC) NOT DETECTED NOT DETECTED Final   Shigella/Enteroinvasive E coli (EIEC) NOT DETECTED NOT DETECTED Final   Cryptosporidium NOT DETECTED NOT DETECTED Final   Cyclospora cayetanensis NOT DETECTED NOT DETECTED Final   Entamoeba histolytica NOT DETECTED NOT DETECTED Final   Giardia lamblia NOT DETECTED NOT DETECTED Final   Adenovirus F40/41 NOT DETECTED NOT DETECTED Final   Astrovirus NOT DETECTED NOT DETECTED Final   Norovirus GI/GII NOT DETECTED NOT DETECTED Final   Rotavirus A NOT DETECTED NOT DETECTED Final   Sapovirus (I, II, IV, and V) NOT DETECTED NOT DETECTED Final  Aerobic/Anaerobic Culture (surgical/deep wound)     Status: None (Preliminary result)   Collection Time: 03/01/17 11:00 AM  Result Value Ref Range Status   Specimen Description ABSCESS  Final   Special Requests NONE  Final   Gram Stain   Final    ABUNDANT WBC PRESENT, PREDOMINANTLY PMN FEW GRAM POSITIVE RODS RARE GRAM POSITIVE COCCI IN PAIRS RARE GRAM NEGATIVE RODS Performed at [facility], [address], [place],  [address]    Culture PENDING  Incomplete   Report Status PENDING  Incomplete     Studies: No results found.  Scheduled Meds: . folic acid  1 mg Oral Daily  .  insulin aspart  0-15 Units Subcutaneous TID WC  . potassium chloride  40 mEq Oral Once    Continuous Infusions: . sodium chloride 150 mL/hr at 03/02/17 0309  . piperacillin-tazobactam (ZOSYN)  IV Stopped (03/02/17 9798)     Time spent: 65mns,  I have personally reviewed and interpreted on  03/02/2017 daily labs, tele strips, imagings as discussed above under date review session and assessment and plans.  I reviewed all nursing notes, pharmacy notes, consultant notes,  vitals, pertinent old records  I have discussed plan of care as described above with RN , patient  on 03/02/2017   FFlorencia ReasonsMD, PhD  [facility] Pager [phone] If 7PM-7AM, please contact night-coverage at [email or web address], password [email or web address]12/05/2016, 7:57 AM  LOS: 4 days

## 2017-03-02 NOTE — Progress Notes (Addendum)
Inpatient Diabetes Program Recommendations  AACE/ADA: New Consensus Statement on Inpatient Glycemic Control (2015)  Target Ranges:  Prepandial:   less than 140 mg/dL      Peak postprandial:   less than 180 mg/dL (1-2 hours)      Critically ill patients:  140 - 180 mg/dL   Results for [name], [name] (MRN [id number]) as of 03/02/2017 09:44  Ref. Range 02/28/2017 07:26 02/28/2017 11:08 02/28/2017 16:55 02/28/2017 17:31 02/28/2017 17:55 02/28/2017 21:45  Glucose-Capillary Latest Ref Range: 65 - 99 mg/dL 924 (H)  2 units Novolog 161 (H)  2 units Novolog +  15 units Lantus 69  0 units Novolog 54 (L) 75 77   Results for [name], [name] (MRN [id number]) as of 03/02/2017 09:44  Ref. Range 03/01/2017 07:31 03/01/2017 11:26 03/01/2017 16:00 03/01/2017 21:05  Glucose-Capillary Latest Ref Range: 65 - 99 mg/dL 229 (H)  0 units Novolog 174 (H)  0 units Novolog +  8 units Lantus 218 (H)  8 units Novolog 185 (H)   Results for [name], [name] (MRN [id number]) as of 03/02/2017 09:44  Ref. Range 03/02/2017 05:43 03/02/2017 06:11 03/02/2017 06:35 03/02/2017 07:37  Glucose-Capillary Latest Ref Range: 65 - 99 mg/dL 41 (LL) 32 (LL) 28 (LL) 75    Admit with: DKA/ Diarrhea/ Perirectal Abscess/ Anemia  History: Type 1 DM  Home DM Meds: Lantus 16 units BID       Humalog 8 units TID       Humalog SSI  Current Insulin Orders: Novolog Moderate Correction Scale/ SSI (0-15 units) TID AC         MD- Note patient has been having issues with Hypoglycemia.  Severely Hypoglycemic this AM.  Note Lantus has been discontinued at this time.  Last dose given was 8 units yesterday (12/02) at 1pm.  Not sure if patient had Severe Hypoglycemia this AM form the Lantus or from the large dose of Novolog patient received at 4 pm yesterday (8 units Novolog)??  Concern that Lantus has been discontinued.  Patient has Type 1 DM and does not make any endogenous insulin.  Will likely need some basal insulin back to prevent  DKA.  Please consider the following:  1. Start back Lantus 8 units daily (25% total home dose)  2. Reduce and Change Novolog SSI to Sensitive scale (0-9 units) Q4 hours   Addendum: Called Dr. Daune Perch office today [facility]).  Patient is no longer a patient at that practice.  Last visit with Dr. Sharl Ma was November 2016.  Addendum 5pm- Spoke with pt this afternoon.  Pt very teary and weak appearing.  Told me she was dismissed from Dr. Daune Perch office in 2016.  I asked pt why she hasn't sought care under another Endocrinology office, however, she became teary and said she couldn't afford the visits.  Used to wear an insulin pump but stopped the pump in 2016.  Has [employer].  I strongly encouraged pt to seek follow-up care under an Endocrinologist here in [place].  Left the names of several practices for pt in the AVS.  Pt appeared very depressed and may benefit from a Psych consult.      --Will follow patient during hospitalization--  Ambrose Finland RN, MSN, CDE Diabetes Coordinator Inpatient Glycemic Control Team Team Pager: [phone] (8a-5p)

## 2017-03-02 NOTE — Progress Notes (Signed)
Date: March 02, 2017 Velva Harman, BSN, [place], [place] Chart and notes review for patient progress and needs. Will follow for case management and discharge needs. Next review date: [id number]

## 2017-03-02 NOTE — Progress Notes (Signed)
Initial Nutrition Assessment  DOCUMENTATION CODES:   Underweight, Non-severe (moderate) malnutrition in context of chronic illness  INTERVENTION:   Ensure Enlive po TID, each supplement provides 350 kcal and 20 grams of protein  NUTRITION DIAGNOSIS:   Moderate Malnutrition related to chronic illness(T1DM non-compliance) as evidenced by 11.5% weight loss in 5 months, moderate fat depletion, moderate muscle depletion.  GOAL:   Patient will meet greater than or equal to 90% of their needs  MONITOR:   PO intake, Supplement acceptance, Weight trends, Labs  REASON FOR ASSESSMENT:   Other (Comment)(Low BMI)    ASSESSMENT:   Pt with PMH significant for type 1 DM, HTN, and gastroparesis. Presents this admission with altered mental status. Admitted for DKA.    Spoke with pt at bedside. Reports having decreased PO intake for 6 months due to having a stressful work schedule and ongoing diarrhea. States that she typically has loose watery stool 3 times per day and eats two meals per day. Her work keeps her from eating during the day. She eats lunch at work and dinner is her biggest meal. Suspect pt has not consumed energy needs for a prolonged period. Hemoglobin A1c noted to be 12.2, suspect poor compliance with insulin. Pt amendable to Ensure this hospital stay.  Records indicate pt weighed 130 lb in June 2018 and 115 lb this hospital stay. This shows a 11.5% weight loss in 5 months. Significant for time frame.  Nutrition-Focused physical exam completed.   Medications reviewed and include: folic acid, SSI, NS @ 150 ml/hr, IV abx Labs reviewed: K 3.4 (L) A1C 12.2 (H)  NUTRITION - FOCUSED PHYSICAL EXAM:    Most Recent Value  Orbital Region  Mild depletion  Upper Arm Region  Moderate depletion  Thoracic and Lumbar Region  Unable to assess  Buccal Region  Moderate depletion  Temple Region  Moderate depletion  Clavicle Bone Region  Moderate depletion  Clavicle and Acromion Bone Region   Moderate depletion  Scapular Bone Region  Unable to assess  Dorsal Hand  Mild depletion  Patellar Region  Moderate depletion  Anterior Thigh Region  Moderate depletion  Posterior Calf Region  Moderate depletion  Edema (RD Assessment)  Mild  Hair  Reviewed  Eyes  Reviewed  Mouth  Reviewed  Skin  Reviewed  Nails  Reviewed       Diet Order:  Diet heart healthy/carb modified Room service appropriate? Yes; Fluid consistency: Thin  EDUCATION NEEDS:   Education needs have been addressed  Skin:  Skin Assessment: Skin Integrity Issues: Skin Integrity Issues:: Incisions Incisions: perineum  Last BM:  02/28/17  Height:   Ht Readings from Last 1 Encounters:  02/26/17 5\' 7"  (1.702 m)    Weight:   Wt Readings from Last 1 Encounters:  02/26/17 115 lb (52.2 kg)    Ideal Body Weight:  61.4 kg  BMI:  Body mass index is 18.01 kg/m.  Estimated Nutritional Needs:   Kcal:  1600-1800 kcal/day  Protein:  80-90 g/day  Fluid:  >1.6 L/day   Vanessa Kick RD, LDN Clinical Nutrition Pager # [phone]

## 2017-03-03 ENCOUNTER — Inpatient Hospital Stay (HOSPITAL_COMMUNITY): Payer: 59

## 2017-03-03 DIAGNOSIS — R609 Edema, unspecified: Secondary | ICD-10-CM

## 2017-03-03 LAB — BASIC METABOLIC PANEL
Anion gap: 3 — ABNORMAL LOW (ref 5–15)
BUN: 7 mg/dL (ref 6–20)
CO2: 26 mmol/L (ref 22–32)
Calcium: 7.5 mg/dL — ABNORMAL LOW (ref 8.9–10.3)
Chloride: 107 mmol/L (ref 101–111)
Creatinine, Ser: 0.43 mg/dL — ABNORMAL LOW (ref 0.44–1.00)
GFR calc Af Amer: >60 mL/min (ref >60)
GFR calc non Af Amer: >60 mL/min (ref >60)
Glucose, Bld: 234 mg/dL — ABNORMAL HIGH (ref 65–99)
Potassium: 3.9 mmol/L (ref 3.5–5.1)
Sodium: 136 mmol/L (ref 135–145)

## 2017-03-03 LAB — TYPE AND SCREEN
ABO/RH(D): O POS
Antibody Screen: NEGATIVE
Unit division: 0
Unit division: 0

## 2017-03-03 LAB — BPAM RBC
Blood Product Expiration Date: 201812212359
Blood Product Expiration Date: 201812232359
ISSUE DATE / TIME: 201811300407
Unit Type and Rh: 5100
Unit Type and Rh: 5100

## 2017-03-03 LAB — GASTROINTESTINAL PANEL BY PCR, STOOL (REPLACES STOOL CULTURE)

## 2017-03-03 LAB — CORTISOL: Cortisol, Plasma: 7.3 ug/dL

## 2017-03-03 LAB — GLUCOSE, CAPILLARY
Glucose-Capillary: 32 mg/dL — CL (ref 65–99)
Glucose-Capillary: 171 mg/dL — ABNORMAL HIGH (ref 65–99)
Glucose-Capillary: 131 mg/dL — ABNORMAL HIGH (ref 65–99)
Glucose-Capillary: 179 mg/dL — ABNORMAL HIGH (ref 65–99)
Glucose-Capillary: 95 mg/dL (ref 65–99)
Glucose-Capillary: 246 mg/dL — ABNORMAL HIGH (ref 65–99)

## 2017-03-03 LAB — CBC
HCT: 21.4 % — ABNORMAL LOW (ref 36.0–46.0)
Hemoglobin: 7.1 g/dL — ABNORMAL LOW (ref 12.0–15.0)
MCH: 31.1 pg (ref 26.0–34.0)
MCHC: 33.2 g/dL (ref 30.0–36.0)
MCV: 93.9 fL (ref 78.0–100.0)
Platelets: 191 10*3/uL (ref 150–400)
RBC: 2.28 MIL/uL — ABNORMAL LOW (ref 3.87–5.11)
RDW: 14.7 % (ref 11.5–15.5)
WBC: 7.5 10*3/uL (ref 4.0–10.5)

## 2017-03-03 LAB — MAGNESIUM: Magnesium: 2 mg/dL (ref 1.7–2.4)

## 2017-03-03 LAB — PREPARE RBC (CROSSMATCH)

## 2017-03-03 LAB — CULTURE, BLOOD (ROUTINE X 2)
Culture: NO GROWTH
Special Requests: ADEQUATE

## 2017-03-03 LAB — OCCULT BLOOD X 1 CARD TO LAB, STOOL: Fecal Occult Bld: POSITIVE — AB

## 2017-03-03 MED ORDER — CHOLESTYRAMINE 4 G PO PACK
4.0000 g | PACK | Freq: Every day | ORAL | Status: DC
  Filled 2017-03-03: qty 1

## 2017-03-03 MED ORDER — ENSURE ENLIVE PO LIQD: 237.0000 mL | Freq: Three times a day (TID) | ORAL | Status: DC

## 2017-03-03 MED ORDER — SODIUM CHLORIDE 0.9 % IV SOLN
Freq: Once | INTRAVENOUS | Status: AC
  Administered 2017-03-03: 12:00:00 via INTRAVENOUS

## 2017-03-03 MED ORDER — CEFAZOLIN SODIUM-DEXTROSE 1-4 GM/50ML-% IV SOLN
1.0000 g | Freq: Three times a day (TID) | INTRAVENOUS | Status: DC
  Administered 2017-03-03 – 2017-03-06 (×8): 1 g via INTRAVENOUS
  Filled 2017-03-03 (×10): qty 50

## 2017-03-03 MED ORDER — METRONIDAZOLE 500 MG PO TABS
500.0000 mg | ORAL_TABLET | Freq: Three times a day (TID) | ORAL | Status: AC
  Administered 2017-03-04 – 2017-03-13 (×29): 500 mg via ORAL
  Filled 2017-03-03 (×29): qty 1

## 2017-03-03 NOTE — Progress Notes (Addendum)
Inpatient Diabetes Program Recommendations  AACE/ADA: New Consensus Statement on Inpatient Glycemic Control (2015)  Target Ranges:  Prepandial:   less than 140 mg/dL      Peak postprandial:   less than 180 mg/dL (1-2 hours)      Critically ill patients:  140 - 180 mg/dL   Results for [name], [name] (MRN [id number]) as of 03/03/2017 08:09  Ref. Range 03/02/2017 05:43 03/02/2017 06:11 03/02/2017 06:35 03/02/2017 07:37 03/02/2017 12:00 03/02/2017 16:31 03/02/2017 21:26  Glucose-Capillary Latest Ref Range: 65 - 99 mg/dL 41 (LL) 32 (LL) 28 (LL) 75 205 (H) 308 (H) 255 (H)   Results for [name], [name] (MRN [id number]) as of 03/03/2017 08:09  Ref. Range 03/03/2017 07:22  Glucose-Capillary Latest Ref Range: 65 - 99 mg/dL 244 (H)     Admit with: DKA/ Diarrhea/ Perirectal Abscess/ Anemia  History: Type 1 DM  Home DM Meds: Lantus 16 units BID                             Humalog 8 units TID                             Humalog SSI  Current Insulin Orders: Novolog Sensitive Correction Scale/ SSI (0-9 units) TID AC       MD- Note patient received 8 units Lantus yesterday at 4pm.  CBG much better this AM: 131 mg/dl.  Patient has Type 1 DM and does not make any endogenous insulin.  Will likely need some basal insulin back to prevent DKA.  Please consider the following:  1. Start back Lantus 8 units daily  2. Start very low dose Novolog Meal Coverage: Novolog 2 units TID with meals (hold if pt eats <50% of meal)  3. If PO Intake remains poor, may want to increase frequency of Novolog SSI to Q4 hours  4. Encouraged pt to call one of the local Endocrinology practices in [place] to get established for better DM care.  Does not have current Insulin Prescriptions.  Please make sure to give patient Rxs for Lantus and Humalog at time of discharge until pt can get established with ENDO practice.      --Will follow patient during hospitalization--  Ambrose Finland RN, MSN,  CDE Diabetes Coordinator Inpatient Glycemic Control Team Team Pager: [phone] (8a-5p)

## 2017-03-03 NOTE — Progress Notes (Signed)
Report called to nurse on 5 East. Patient transferred to 1503.

## 2017-03-03 NOTE — Progress Notes (Signed)
[facility]:  [phone] General Surgery Progress Note   LOS: 5 days  POD -  2 Days Post-Op  Chief Complaint: Buttock pain  Assessment and Plan: 1.  Perirectal abscess  On Zosyn  Looks like there is not any need for additional drainage.  Continue dressing changes.  2.  Diabetes - poorly managed  She was seeing Dr. Buddy Duty - but looking for another endocrinologist because of finances  3. Chronic diarrhea and weight loss  Etiology unclear 4.  Anxiety 5.  Anemia  Hgb - 8.2 - 03/01/2017 6.  Malnourished  Albumin - 1.4 - 03/01/2017 7.  Ascites/pleural effusion - seen on CT scan  Possibly based on poorly controlled diabetes and malnutrition   Principal Problem:   MDD (major depressive disorder), recurrent episode, mild (HCC) Active Problems:   Diabetes type 1, uncontrolled (HCC)   Protein-calorie malnutrition, severe ([place])   DKA (diabetic ketoacidosis) (HCC)   Diarrhea   Lower GI bleed   Subjective: Having pain with dressing changes.     Objective:   Vitals:   03/03/17 1143 03/03/17 1159  BP:  115/74  Pulse: (!) 102 (!) 103  Resp: 17 13  Temp:    SpO2: 97% 95%     Intake/Output from previous day:  12/03 0701 - 12/04 0700 In: 2607.5 [I.V.:2457.5; IV Piggyback:150] Out: 1320 [Urine:920; Stool:400]  Intake/Output this shift:  No intake/output data recorded.   Physical Exam:   General: Thin AA F who is alert and oriented. Tearful.   HEENT: Normal. Pupils equal. .   Lungs: breathing comfortably.   Abdomen: Soft   Wound: Beefy base.  No areas of purulent drainage.  Minimal tunneling.   Lab Results:    Recent Labs    03/02/17 0346 03/03/17 0324  WBC 7.6 7.5  HGB 7.2* 7.1*  HCT 21.6* 21.4*  PLT 157 191    BMET   Recent Labs    03/02/17 0346 03/03/17 0324  NA 137 136  K 3.4* 3.9  CL 107 107  CO2 27 26  GLUCOSE 68 234*  BUN <5* 7  CREATININE <0.30* 0.43*  CALCIUM 7.2* 7.5*    PT/INR   No results for input(s): LABPROT, INR  in the last 72 hours.  ABG   No results for input(s): PHART, HCO3 in the last 72 hours.  Invalid input(s): PCO2, PO2   Studies/Results:  No results found.   Anti-infectives:   Anti-infectives (From admission, onward)   Start     Dose/Rate Route Frequency Ordered Stop   03/02/17 1800  vancomycin (VANCOCIN) 50 mg/mL oral solution 125 mg     125 mg Oral Every 6 hours 03/02/17 1521     02/28/17 1300  piperacillin-tazobactam (ZOSYN) IVPB 3.375 g     3.375 g 12.5 mL/hr over 240 Minutes Intravenous Every 8 hours 02/28/17 [facility]: [phone] 03/03/2017

## 2017-03-03 NOTE — Progress Notes (Signed)
PT Cancellation Note  Patient Details Name: [name] MRN: [id number] DOB: [birth year]   Cancelled Treatment:    Reason Eval/Treat Not Completed: Pain limiting ability to participate   Rada Hay 03/03/2017, 1:58 PM Blanchard Kelch PT [phone]

## 2017-03-03 NOTE — Progress Notes (Signed)
NUTRITION NOTE  Consult received for poor PO intakes. Pt was seen for full assessment by another RD yesterday with associated note at 11:23 AM. At that time, pt was identified with non-severe/moderate malnutrition and Ensure Enlive was ordered TID (each supplement provides 350 kcal, 20 grams of protein). Night shift staff d/c'ed Ensure and changed to Glucerna Shake TID (each supplement provides 220 kcal, 10 grams of protein). Will change supplement order back to Ensure at this time.   Noted that GI MD note from yesterday at 5:38 PM stated pt with severe malnutrition d/t albumin of 1.4. Albumin has a half-life of 21 days and is strongly affected by stress response and inflammatory process, therefore, do not expect to see an improvement in this lab value during acute hospitalization. When a patient presents with low albumin, it is likely skewed due to the acute inflammatory response. Note that low albumin is no longer used to diagnose malnutrition; [place] uses the new malnutrition guidelines published by the American Society for Parenteral and Enteral Nutrition (A.S.P.E.N.) and the Academy of Nutrition and Dietetics (AND).      RD will continue to follow per protocol.      Trenton Gammon, MS, RD, LDN, [facility] Inpatient Clinical Dietitian Pager # [phone] After hours/weekend pager # [phone]

## 2017-03-03 NOTE — Progress Notes (Signed)
Subjective: No new complaints.  Nursing does reports that she is better compared to yesterday, although she does not report feeling better.  Objective: Vital signs in last 24 hours: Temp:  [97.6 F (36.4 C)-99 F (37.2 C)] 98.5 F (36.9 C) (12/04 1233) Pulse Rate:  [88-103] 103 (12/04 1233) Resp:  [5-17] 15 (12/04 1233) BP: (74-116)/(44-78) 116/78 (12/04 1233) SpO2:  [84 %-99 %] 95 % (12/04 1233) Last BM Date: 02/28/17  Intake/Output from previous day: 12/03 0701 - 12/04 0700 In: 2607.5 [I.V.:2457.5; IV Piggyback:150] Out: 1320 [Urine:920; Stool:400] Intake/Output this shift: No intake/output data recorded.  General appearance: alert and moderate distress GI: diffusely tender  Lab Results: Recent Labs    03/01/17 0344 03/02/17 0346 03/03/17 0324  WBC 8.4 7.6 7.5  HGB 8.2* 7.2* 7.1*  HCT 24.3* 21.6* 21.4*  PLT 149* 157 191   BMET Recent Labs    03/01/17 0344 03/02/17 0346 03/03/17 0324  NA 136 137 136  K 3.6 3.4* 3.9  CL 106 107 107  CO2 27 27 26   GLUCOSE 75 68 234*  BUN <5* <5* 7  CREATININE 0.40* <0.30* 0.43*  CALCIUM 7.6* 7.2* 7.5*   LFT Recent Labs    03/01/17 0344  PROT 6.1*  ALBUMIN 1.4*  AST 14*  ALT 7*  ALKPHOS 106  BILITOT 0.4   PT/INR No results for input(s): LABPROT, INR in the last 72 hours. Hepatitis Panel No results for input(s): HEPBSAG, HCVAB, HEPAIGM, HEPBIGM in the last 72 hours. C-Diff No results for input(s): CDIFFTOX in the last 72 hours. Fecal Lactopherrin No results for input(s): FECLLACTOFRN in the last 72 hours.  Studies/Results: No results found.  Medications:  Scheduled: . citalopram  10 mg Oral Daily  . feeding supplement (GLUCERNA SHAKE)  237 mL Oral TID BM  . folic acid  1 mg Oral Daily  . insulin aspart  0-9 Units Subcutaneous TID WC  . vancomycin  125 mg Oral Q6H   Continuous: . piperacillin-tazobactam (ZOSYN)  IV Stopped (03/03/17 1019)    Assessment/Plan: 1) Diarrhea - ? Etiology. 2) Perirectal  abscess. 3) DM. 4) Gastroparesis.   Emperically she is being treated for C. Diff.  She is antigen positive, but toxin negative with severe diarrhea.  Her diarrhea has improved, per nursing.  Questran was started, but it was not administered yesterday as it was difficult to give the medication at the prescribed schedule and to avoid other medications.  There is no need for Questran at this time as it will risk inactivating the oral vancomycin.  Being on Zosyn, which is necessary, does interfere with C. Diff treatment.  Once changing over to oral antibiotics flouroquinolones needs to be avoided.  Plan: 1) Continue with the current supportive care. 2) Stringent control of her diabetes.  LOS: 5 days   Marti Acebo D 03/03/2017, 12:48 PM

## 2017-03-03 NOTE — Progress Notes (Signed)
LE venous duplex prelim: negative for DVT. Jaccob Czaplicki Eunice, RDMS, RVT  

## 2017-03-03 NOTE — Progress Notes (Signed)
2150 pt has arrived to 1503, husband is with her, pain concerns addressed, flexiseal & foley in place, oriented to the room.

## 2017-03-03 NOTE — Progress Notes (Signed)
Pharmacy Antibiotic Note  [name] is a 26 y.o. female admitted on 02/26/2017 with rectal abscess.  Pharmacy has been consulted for zosyn dosing.  Today, 03/03/2017 Day #4 Zosyn Tmax 99, PCT neg WBC 7.5 SCr 0.43, CrCl 87 Wound culture pending  Plan: Zosyn 3.375g IV q8h (4 hour infusion).  No further dosing adjustments needed, Pharmacy will sign off  Height: 5\' 7"  (170.2 cm) Weight: 115 lb (52.2 kg) IBW/kg (Calculated) : 61.6  Temp (24hrs), Avg:98.2 F (36.8 C), Min:97.5 F (36.4 C), Max:99 F (37.2 C)  Recent Labs  Lab 02/26/17 1659  02/27/17 0015 02/27/17 0355 02/27/17 1136  02/27/17 2348 02/28/17 0700 03/01/17 0344 03/02/17 0346 03/03/17 0324  WBC  --    < > 11.6* 9.7  --    < > 10.0 8.6 8.4 7.6 7.5  CREATININE  --    < > 0.73 0.53  0.50 0.36*  --   --  <0.30* 0.40* <0.30* 0.43*  LATICACIDVEN 0.87  --  1.4 1.4  --   --   --   --   --   --   --    < > = values in this interval not displayed.    Estimated Creatinine Clearance: 87 mL/min (A) (by C-G formula based on SCr of 0.43 mg/dL (L)).    Allergies  Allergen Reactions  . Peanut-Containing Drug Products Anaphylaxis and Other (See Comments)    Pt states that she is allergic to [place] nuts.      . Lactose Intolerance (Gi) Other (See Comments)    Gi upset    Antimicrobials this admission: 12/1 zosyn>>  Microbiology results:  11/29 at 1655 BCx x2: ngtd 11/29 at 2300 bcx x2:ngtd 11/29 UCx: NGF 11/29 MRSA PCR: neg 11/29 SA PCR NEG 11/30 HIV neg 12/1: BCID 1 of 4 bottles1655 Gm + rods - Lactobacillus species 12/2 Surgical wound: few GPR, rare GPC in pairs, rare GNR (reincubating) 12/4 GI panel  Thank you for allowing pharmacy to be a part of this patient's care.  Loralee Pacas, PharmD, BCPS Pager: [phone] 03/03/2017 10:56 AM

## 2017-03-03 NOTE — Progress Notes (Signed)
PROGRESS NOTE  [name] PYP:[id number] DOB: [birth year] DOA: 02/26/2017 PCP: Kathryn Blade, MD  HPI/Recap of past 24 hours:  s/p debridement on 12/2 No Hypoglycemia event overnight  Watery stool in rectal tube,Report rectal pain, denies abdominal pain, no n/v.  bp low normal, heart rate better Persistent Bilateral lower extremity edema   Assessment/Plan: Principal Problem:   MDD (major depressive disorder), recurrent episode, mild (HCC) Active Problems:   Diabetes type 1, uncontrolled ([place])   Protein-calorie malnutrition, severe ([place])   DKA (diabetic ketoacidosis) ([place])   Diarrhea   Lower GI bleed    DKA with metabolic encephalopathy and  severe acidosis venous blood gas pH 6.8 on presentation:  -She re received insulin drip IV fluids, required ICU admission -Improving, off insulin drip  Poorly controlled type 1 diabetes -H/o Medication noncompliance -Patient reports not able to afford insulin, she reports one half brand insulin make her feel bloated. -a1c 12.2 -Diabetes educator and case manager consulted.  Hypoglycemia event   ? due to inconsistent oral intake,   hypoglycemia protocol Continue adjust insulin dose   periRectal abscess  -CT imaging with complex fluids collection -s/p debridement in OR on 12/2,  - surgical culture + Klebsiella,  zosyn d/ced, changed to ancef, with flagyl to cover anaerobes -will follow general surgery recommendations  Acute on chronic anemia -Hemoglobin 6.4 on admission, status post PRBC transfusionx1 on November 30, prbc x1 on 12/4 -B12 unremarkable, folate slightly low, Start folic acid supplement -transfuse prn to keep hgb >7.  Chronic diarrhea//weight loss with anemia/malnutrition -Report followed by GI Dr. Collene Mares -Ct ab Carron Curie + abscess and ascites but no bowel wall thickening -GI ordered c diff which showed antigen positive, toxin negative. Due to chronic debilitating diarrhea will start oral vanc. start Lucrezia Starch  stopped due to interactive with oral vanc. -rectal tube in place  Moisture associated skin damage around anal area  Likely from chronic diarrhea Wound care consulted  Hypokalemia /hypomagnesemia Replete k/mag  Malnutrition. moderate: CT ab/pel" Moderate volume ascites with diffuse soft tissue and body wall edema." Albumin 1.4 Nutrition consulted  Bilateral lower extremity edema: likely third spacing Venous doppler no DVT. D/c ivf Nutrition supplement.  Anxiety/ depression: Psych input appreciated, recommend celexa 40m daily for a few days, increase to 236mdaily if able to tolerate.    Code Status: full  Family Communication: patient   Disposition Plan: transfer to med surg   Consultants:   GI  General surgery  Critical care  Procedures:  Perirectal abscess debridement on 12/2  Antibiotics:  Zosyn from 12/1 to 12/4  Ancef /flagyl from 12/4   Oral vanc from 12/3   Objective: BP (!) 87/52   Pulse 95   Temp 98.2 F (36.8 C) (Oral)   Resp (!) 9   Ht 5' 7"  (1.702 m)   Wt 52.2 kg (115 lb)   LMP 02/23/2017   SpO2 96%   BMI 18.01 kg/m   Intake/Output Summary (Last 24 hours) at 03/03/2017 0844 Last data filed at 03/03/2017 062458ross per 24 hour  Intake 2607.5 ml  Output 1320 ml  Net 1287.5 ml   Filed Weights   02/26/17 1708  Weight: 52.2 kg (115 lb)    Exam: Patient is examined daily including today on 03/03/2017, exams remain the same as of yesterday except that has changed    General:  NAD, labile mood, does not make good eye contact during conversation, she replies  "I do not remember " to most questions.  Cardiovascular: slight sinus tachycardia  Respiratory: CTABL  Abdomen: Soft/ND/NT, positive BS  Musculoskeletal: bilateral lower extremity Edema, perirectal skin irritation  Neuro: alert, oriented   Data Reviewed: Basic Metabolic Panel: Recent Labs  Lab 02/26/17 2011  02/27/17 1136 02/28/17 0700 03/01/17 0344  03/02/17 0346 03/03/17 0324  NA 139   < > 139 135 136 137 136  K 3.0*   < > 2.9* 3.2* 3.6 3.4* 3.9  CL 116*   < > 117* 109 106 107 107  CO2 <7*   < > 17* 23 27 27 26   GLUCOSE 355*   < > 116* 141* 75 68 234*  BUN 8   < > 5* <5* <5* <5* 7  CREATININE 0.82   < > 0.36* <0.30* 0.40* <0.30* 0.43*  CALCIUM 7.9*   < > 7.5* 7.4* 7.6* 7.2* 7.5*  MG 1.9  --   --   --  1.5* 1.9 2.0  PHOS 2.9  --   --   --   --   --   --    < > = values in this interval not displayed.   Liver Function Tests: Recent Labs  Lab 02/26/17 1645 03/01/17 0344  AST 13* 14*  ALT 11* 7*  ALKPHOS 181* 106  BILITOT 2.1* 0.4  PROT 7.8 6.1*  ALBUMIN 2.2* 1.4*   No results for input(s): LIPASE, AMYLASE in the last 168 hours. No results for input(s): AMMONIA in the last 168 hours. CBC: Recent Labs  Lab 02/27/17 1200 02/27/17 1821 02/27/17 2348 02/28/17 0700 03/01/17 0344 03/02/17 0346 03/03/17 0324  WBC 12.0* 9.5 10.0 8.6 8.4 7.6 7.5  NEUTROABS 9.4* 7.4 7.1 5.2 5.6  --   --   HGB 8.7* 8.6* 9.0* 8.1* 8.2* 7.2* 7.1*  HCT 25.9* 25.4* 26.5* 23.8* 24.3* 21.6* 21.4*  MCV 91.8 90.7 91.4 91.2 92.0 93.5 93.9  PLT 197 173 167 177 149* 157 191   Cardiac Enzymes:   No results for input(s): CKTOTAL, CKMB, CKMBINDEX, TROPONINI in the last 168 hours. BNP (last 3 results) No results for input(s): BNP in the last 8760 hours.  ProBNP (last 3 results) No results for input(s): PROBNP in the last 8760 hours.  CBG: Recent Labs  Lab 03/02/17 1200 03/02/17 1631 03/02/17 2126 03/03/17 0535 03/03/17 0722  GLUCAP 205* 308* 255* 171* 131*    Recent Results (from the past 240 hour(s))  Blood Culture (routine x 2)     Status: None (Preliminary result)   Collection Time: 02/26/17  4:45 PM  Result Value Ref Range Status   Specimen Description BLOOD RIGHT ANTECUBITAL  Final   Special Requests   Final    BOTTLES DRAWN AEROBIC AND ANAEROBIC Blood Culture adequate volume   Culture   Final    NO GROWTH 4 DAYS Performed at  [facility], [place] [address] [place], [place] [address]    Report Status PENDING  Incomplete  Blood Culture (routine x 2)     Status: Abnormal   Collection Time: 02/26/17  4:55 PM  Result Value Ref Range Status   Specimen Description BLOOD LEFT ANTECUBITAL  Final   Special Requests   Final    BOTTLES DRAWN AEROBIC AND ANAEROBIC Blood Culture adequate volume   Culture  Setup Time   Final    IN BOTH AEROBIC AND ANAEROBIC BOTTLES CRITICAL RESULT CALLED TO, READ BACK BY AND VERIFIED WITH: B. GREEN PHARMD, AT 2333 02/27/17 BY D. VANHOOK CORRECTED RESULTS GRAM POSITIVE RODS PREVIOUSLY REPORTED  AS: GRAM POSITIVE COCCI IN CHAINS CORRECTED RESULTS CALLED TO: PHARMD M BELL [phone] MLM    Culture (A)  Final    LACTOBACILLUS SPECIES Standardized susceptibility testing for this organism is not available. Performed at [facility], [place] [address] [place], [place] [address]    Report Status 03/02/2017 FINAL  Final  Blood Culture ID Panel (Reflexed)     Status: None   Collection Time: 02/26/17  4:55 PM  Result Value Ref Range Status   Enterococcus species NOT DETECTED NOT DETECTED Final   Listeria monocytogenes NOT DETECTED NOT DETECTED Final   Staphylococcus species NOT DETECTED NOT DETECTED Final   Staphylococcus aureus NOT DETECTED NOT DETECTED Final   Streptococcus species NOT DETECTED NOT DETECTED Final   Streptococcus agalactiae NOT DETECTED NOT DETECTED Final   Streptococcus pneumoniae NOT DETECTED NOT DETECTED Final   Streptococcus pyogenes NOT DETECTED NOT DETECTED Final   Acinetobacter baumannii NOT DETECTED NOT DETECTED Final   Enterobacteriaceae species NOT DETECTED NOT DETECTED Final   Enterobacter cloacae complex NOT DETECTED NOT DETECTED Final   Escherichia coli NOT DETECTED NOT DETECTED Final   Klebsiella oxytoca NOT DETECTED NOT DETECTED Final   Klebsiella pneumoniae NOT DETECTED NOT DETECTED Final   Proteus species NOT DETECTED NOT DETECTED Final   Serratia  marcescens NOT DETECTED NOT DETECTED Final   Haemophilus influenzae NOT DETECTED NOT DETECTED Final   Neisseria meningitidis NOT DETECTED NOT DETECTED Final   Pseudomonas aeruginosa NOT DETECTED NOT DETECTED Final   Candida albicans NOT DETECTED NOT DETECTED Final   Candida glabrata NOT DETECTED NOT DETECTED Final   Candida krusei NOT DETECTED NOT DETECTED Final   Candida parapsilosis NOT DETECTED NOT DETECTED Final   Candida tropicalis NOT DETECTED NOT DETECTED Final    Comment: Performed at [facility], [place] [address] [place], [place] [address]  C difficile quick scan w PCR reflex     Status: Abnormal   Collection Time: 02/26/17  7:45 PM  Result Value Ref Range Status   C Diff antigen POSITIVE (A) NEGATIVE Final   C Diff toxin NEGATIVE NEGATIVE Final   C Diff interpretation Results are indeterminate. See PCR results.  Final  Culture, blood (x 2)     Status: None (Preliminary result)   Collection Time: 02/26/17 11:00 PM  Result Value Ref Range Status   Specimen Description BLOOD LEFT WRIST  Final   Special Requests   Final    BOTTLES DRAWN AEROBIC AND ANAEROBIC Blood Culture adequate volume   Culture   Final    NO GROWTH 3 DAYS Performed at [facility], [address], [place], [place] [address]    Report Status PENDING  Incomplete  Urine culture     Status: None   Collection Time: 02/26/17 11:00 PM  Result Value Ref Range Status   Specimen Description URINE, RANDOM  Final   Special Requests NONE  Final   Culture   Final    NO GROWTH Performed at [facility], [place] [address], [place], [place] [address]    Report Status 02/28/2017 FINAL  Final  MRSA PCR Screening     Status: None   Collection Time: 02/26/17 11:00 PM  Result Value Ref Range Status   MRSA by PCR NEGATIVE NEGATIVE Final    Comment:        The GeneXpert MRSA Assay (FDA approved for NASAL specimens only), is one component of a comprehensive MRSA colonization surveillance program. It is  not intended to diagnose MRSA infection nor to guide or monitor treatment for MRSA infections.   Culture, blood (x 2)     Status: None (Preliminary result)   Collection Time: 02/26/17 11:05 PM  Result Value Ref Range Status   Specimen Description BLOOD LEFT HAND  Final   Special Requests IN PEDIATRIC BOTTLE Blood Culture adequate volume  Final   Culture   Final    NO GROWTH 3 DAYS Performed at [facility], [place] [address] [place], [place] [address]    Report Status PENDING  Incomplete  Gastrointestinal Panel by PCR , Stool     Status: None   Collection Time: 02/27/17  8:49 AM  Result Value Ref Range Status   Campylobacter species NOT DETECTED NOT DETECTED Final   Plesimonas shigelloides NOT DETECTED NOT DETECTED Final   Salmonella species NOT DETECTED NOT DETECTED Final   Yersinia enterocolitica NOT DETECTED NOT DETECTED Final   Vibrio species NOT DETECTED NOT DETECTED Final   Vibrio cholerae NOT DETECTED NOT DETECTED Final   Enteroaggregative E coli (EAEC) NOT DETECTED NOT DETECTED Final   Enteropathogenic E coli (EPEC) NOT DETECTED NOT DETECTED Final   Enterotoxigenic E coli (ETEC) NOT DETECTED NOT DETECTED Final   Shiga like toxin producing E coli (STEC) NOT DETECTED NOT DETECTED Final   Shigella/Enteroinvasive E coli (EIEC) NOT DETECTED NOT DETECTED Final   Cryptosporidium NOT DETECTED NOT DETECTED Final   Cyclospora cayetanensis NOT DETECTED NOT DETECTED Final   Entamoeba histolytica NOT DETECTED NOT DETECTED Final   Giardia lamblia NOT DETECTED NOT DETECTED Final   Adenovirus F40/41 NOT DETECTED NOT DETECTED Final   Astrovirus NOT DETECTED NOT DETECTED Final   Norovirus GI/GII NOT DETECTED NOT DETECTED Final   Rotavirus A NOT DETECTED NOT DETECTED Final   Sapovirus (I, II, IV, and V) NOT DETECTED NOT DETECTED Final  Aerobic/Anaerobic Culture (surgical/deep wound)     Status: None (Preliminary result)   Collection Time: 03/01/17 11:00 AM  Result Value Ref Range  Status   Specimen Description ABSCESS  Final   Special Requests NONE  Final   Gram Stain   Final    ABUNDANT WBC PRESENT, PREDOMINANTLY PMN FEW GRAM POSITIVE RODS RARE GRAM POSITIVE COCCI IN PAIRS RARE GRAM NEGATIVE RODS    Culture   Final    CULTURE REINCUBATED FOR BETTER GROWTH Performed at [facility], [address], [place], [place] [address]    Report Status PENDING  Incomplete     Studies: No results found.  Scheduled Meds: . citalopram  10 mg Oral Daily  . feeding supplement (GLUCERNA SHAKE)  237 mL Oral TID BM  . folic acid  1 mg Oral Daily  . insulin aspart  0-9 Units Subcutaneous TID WC  . vancomycin  125 mg Oral Q6H    Continuous Infusions: . piperacillin-tazobactam (ZOSYN)  IV 3.375 g (03/03/17 1740)     Time spent: 28mns, I have personally reviewed and interpreted on  03/03/2017 daily labs, tele strips, imagings as discussed above under date review session and assessment and plans.  I reviewed all nursing notes, pharmacy notes, consultant notes,  vitals, pertinent old records  I have discussed plan of care as described above with RN , patient  on 03/03/2017   FFlorencia ReasonsMD, PhD  [facility] Pager [phone] If 7PM-7AM, please contact night-coverage at [email or web address], password [facility]12/06/2016, 8:44 AM  LOS: 5 days

## 2017-03-04 DIAGNOSIS — E10649 Type 1 diabetes mellitus with hypoglycemia without coma: Secondary | ICD-10-CM

## 2017-03-04 LAB — BPAM RBC
Blood Product Expiration Date: 201812252359
ISSUE DATE / TIME: 201812041216
Unit Type and Rh: 5100

## 2017-03-04 LAB — CBC WITH DIFFERENTIAL/PLATELET
Basophils Absolute: 0 10*3/uL (ref 0.0–0.1)
Basophils Relative: 0 %
Eosinophils Absolute: 0.1 10*3/uL (ref 0.0–0.7)
Eosinophils Relative: 1 %
HCT: 26.5 % — ABNORMAL LOW (ref 36.0–46.0)
Hemoglobin: 8.7 g/dL — ABNORMAL LOW (ref 12.0–15.0)
Lymphocytes Relative: 21 %
Lymphs Abs: 1.6 10*3/uL (ref 0.7–4.0)
MCH: 30.6 pg (ref 26.0–34.0)
MCHC: 32.8 g/dL (ref 30.0–36.0)
MCV: 93.3 fL (ref 78.0–100.0)
Monocytes Absolute: 1 10*3/uL (ref 0.1–1.0)
Monocytes Relative: 14 %
Neutro Abs: 4.9 10*3/uL (ref 1.7–7.7)
Neutrophils Relative %: 64 %
Platelets: 244 10*3/uL (ref 150–400)
RBC: 2.84 MIL/uL — ABNORMAL LOW (ref 3.87–5.11)
RDW: 14.9 % (ref 11.5–15.5)
WBC: 7.7 10*3/uL (ref 4.0–10.5)

## 2017-03-04 LAB — BASIC METABOLIC PANEL
Anion gap: 5 (ref 5–15)
BUN: 10 mg/dL (ref 6–20)
CO2: 27 mmol/L (ref 22–32)
Calcium: 7.8 mg/dL — ABNORMAL LOW (ref 8.9–10.3)
Chloride: 103 mmol/L (ref 101–111)
Creatinine, Ser: 0.42 mg/dL — ABNORMAL LOW (ref 0.44–1.00)
GFR calc Af Amer: >60 mL/min (ref >60)
GFR calc non Af Amer: >60 mL/min (ref >60)
Glucose, Bld: 338 mg/dL — ABNORMAL HIGH (ref 65–99)
Potassium: 4.1 mmol/L (ref 3.5–5.1)
Sodium: 135 mmol/L (ref 135–145)

## 2017-03-04 LAB — TYPE AND SCREEN
ABO/RH(D): O POS
Antibody Screen: NEGATIVE
Unit division: 0

## 2017-03-04 LAB — GLUCOSE, CAPILLARY
Glucose-Capillary: 278 mg/dL — ABNORMAL HIGH (ref 65–99)
Glucose-Capillary: 271 mg/dL — ABNORMAL HIGH (ref 65–99)
Glucose-Capillary: 128 mg/dL — ABNORMAL HIGH (ref 65–99)
Glucose-Capillary: 130 mg/dL — ABNORMAL HIGH (ref 65–99)

## 2017-03-04 LAB — CULTURE, BLOOD (ROUTINE X 2)
Culture: NO GROWTH
Culture: NO GROWTH
Special Requests: ADEQUATE
Special Requests: ADEQUATE

## 2017-03-04 LAB — MAGNESIUM: Magnesium: 1.8 mg/dL (ref 1.7–2.4)

## 2017-03-04 LAB — CLOSTRIDIUM DIFFICILE BY PCR: Toxigenic C. Difficile by PCR: NEGATIVE

## 2017-03-04 MED ORDER — INSULIN GLARGINE 100 UNIT/ML ~~LOC~~ SOLN
8.0000 [IU] | Freq: Every day | SUBCUTANEOUS | Status: DC
  Administered 2017-03-04 – 2017-03-12 (×9): 8 [IU] via SUBCUTANEOUS
  Filled 2017-03-04 (×10): qty 0.08

## 2017-03-04 MED ORDER — PANCRELIPASE (LIP-PROT-AMYL) 12000-38000 UNITS PO CPEP
36000.0000 [IU] | ORAL_CAPSULE | Freq: Three times a day (TID) | ORAL | Status: DC
  Administered 2017-03-04 – 2017-03-14 (×28): 36000 [IU] via ORAL
  Filled 2017-03-04 (×27): qty 3

## 2017-03-04 MED ORDER — INSULIN GLARGINE 100 UNIT/ML ~~LOC~~ SOLN
8.0000 [IU] | Freq: Every day | SUBCUTANEOUS | Status: DC
  Filled 2017-03-04: qty 0.08

## 2017-03-04 NOTE — Progress Notes (Signed)
Wound care performed to perirectal abcess region- Removed ABD pad outer cover & 2 4x4s as packing. Site is very painful & swollen, repacked with 3 saline soaked gauzes & covering with 2 ABD pads. Pt was premedicated with 2 vicodin & 1 ultram as there was NO IV acess for Morphine

## 2017-03-04 NOTE — Progress Notes (Signed)
Per patients request, LCSW provided patient with resources for outpatient therapy.  LCSW signing off. No further needs at this time.    Beulah Gandy [facility] CSW [phone]

## 2017-03-04 NOTE — Clinical Social Work Note (Addendum)
Clinical Social Work Assessment  Patient Details  Name: [name] MRN: [id number] Date of Birth: [birth year]  Date of referral:  03/03/17    -Late Entry           Reason for consult:  Abuse/Neglect, Mental Health Concerns   CSW consult: Self-Neglect, concerns for 26 year old.               Permission sought to share information with:  Family Supports Permission granted to share information::     Name::        Agency::     Relationship::     Contact Information:     Housing/Transportation Living arrangements for the past 2 months:  Single Family Home Source of Information:  Patient Patient Interpreter Needed:  None Criminal Activity/Legal Involvement Pertinent to Current Situation/Hospitalization:  No - Comment as needed Significant Relationships:  Spouse Lives with:  Spouse, Minor Children Do you feel safe going back to the place where you live?  Yes Need for family participation in patient care:  Yes   Care giving concerns:   Patient reports "when I am stressed I get sick, I have not felt this way in three years."  Patient admitted for AMS. The patient reports she has been "very stressed out because of work." She reports her spouse has been working just as much. Patient reports she has been very anxious about her finances and finding care arrangements for her two year old." She reports her parents have been very helpful, assisting her w/ childcare and assisting with some of her finances.   Patient reports she has researched community assistance programs but due to her spouses income combined with her own, they do not qualify for the programs.   She reports having difficulty affording her insulin medications, $70 copay. She reports she has been taking her mother extra insulin. She understands the medication is not strong enough for her.   She has not followed up w/ physician visits due to not being able to affored the co pay.   Patient reports in the past she was taking  medication for her anxiety but stop taking it. Patient reports since restarting her Celexa in the hospital she has been feeling better. Patient is agreeable to outpatient resources to continue to medication management and talk with a counselor.    Social Worker assessment / plan:  CSW met with patient at bedside, explain role and reason for visit. Patient receptive and to talk with CSW. CSW listened attentively to pt. And provided emotional support. CSW offered outpatient resources for counseling and stress management.   Patient plans to return home and follow up with outpatient provider.   Plan: Provide Outpatient resources.    Employment status:  [employer] information:  Managed Medicare PT Recommendations:  Not assessed at this time Information / Referral to community resources:  Outpatient Psychiatric Care   Patient/Family's Response to care:  Agreeable and Responding to care provided. Patient appreciative of CSW visit.   Patient/Family's Understanding of and Emotional Response to Diagnosis, Current Treatment, and Prognosis:  Patient is knowledgeable of her diagnosis and current treatment.   Emotional Assessment Appearance:  Developmentally appropriate, Appears stated age Attitude/Demeanor/Rapport:    Affect (typically observed):  Accepting, Pleasant, Calm Orientation:  Oriented to Self, Oriented to Place, Oriented to  Time, Oriented to Situation Alcohol / Substance use:  Not Applicable Psych involvement (Current and /or in the community):  No   Discharge Needs  Concerns to be addressed:  Mental Health Concerns Readmission within the last 30 days:  No Current discharge risk:  None Barriers to Discharge:  Continued Medical Work up   [employer], LCSW 03/04/2017, 12:23 PM

## 2017-03-04 NOTE — Progress Notes (Signed)
Subjective: She continues to have diarrhea.  Very uncomfortable.  Objective: Vital signs in last 24 hours: Temp:  [98.2 F (36.8 C)-99 F (37.2 C)] 98.3 F (36.8 C) (12/05 0232) Pulse Rate:  [99-102] 102 (12/05 0232) Resp:  [0-20] 20 (12/05 0232) BP: (99-111)/(60-73) 108/73 (12/05 0232) SpO2:  [97 %-99 %] 99 % (12/05 0232) Last BM Date: 03/04/17  Intake/Output from previous day: 12/04 0701 - 12/05 0700 In: 907.5 [P.O.:560; Blood:297.5; IV Piggyback:50] Out: 575 [Urine:375; Stool:200] Intake/Output this shift: Total I/O In: 340 [P.O.:240; IV Piggyback:100] Out: 200 [Stool:200]  General appearance: alert and moderate distress GI: soft, non-tender; bowel sounds normal; no masses,  no organomegaly  Lab Results: Recent Labs    03/02/17 0346 03/03/17 0324 03/04/17 0631  WBC 7.6 7.5 7.7  HGB 7.2* 7.1* 8.7*  HCT 21.6* 21.4* 26.5*  PLT 157 191 244   BMET Recent Labs    03/02/17 0346 03/03/17 0324 03/04/17 0631  NA 137 136 135  K 3.4* 3.9 4.1  CL 107 107 103  CO2 27 26 27   GLUCOSE 68 234* 338*  BUN <5* 7 10  CREATININE <0.30* 0.43* 0.42*  CALCIUM 7.2* 7.5* 7.8*   LFT No results for input(s): PROT, ALBUMIN, AST, ALT, ALKPHOS, BILITOT, BILIDIR, IBILI in the last 72 hours. PT/INR No results for input(s): LABPROT, INR in the last 72 hours. Hepatitis Panel No results for input(s): HEPBSAG, HCVAB, HEPAIGM, HEPBIGM in the last 72 hours. C-Diff No results for input(s): CDIFFTOX in the last 72 hours. Fecal Lactopherrin No results for input(s): FECLLACTOFRN in the last 72 hours.  Studies/Results: No results found.  Medications:  Scheduled: . citalopram  10 mg Oral Daily  . feeding supplement (ENSURE ENLIVE)  237 mL Oral TID BM  . folic acid  1 mg Oral Daily  . insulin aspart  0-9 Units Subcutaneous TID WC  . insulin glargine  8 Units Subcutaneous QHS  . lipase/protease/amylase  36,000 Units Oral TID WC  . metroNIDAZOLE  500 mg Oral Q8H  . vancomycin  125 mg  Oral Q6H   Continuous: .  ceFAZolin (ANCEF) IV Stopped (03/04/17 5520)    Assessment/Plan: 1) Diarrhea - ? Pancreatic insufficiency. 2) C. Diff Ag positive/toxin negative. 3) Perirectal abscess s/p I&D. 4) DM   There is no change with her diarrhea.  Zosyn was changed over to metronidazole and she maintains her oral vanc.  She was diagnosed with DM at the age of 61.  Her diarrhea, at times, per her report, does exhibit some oily spots.  With her type 1 DM she may have exocrine insufficiency as well as endocrine insufficiency.  I will try her on Creon.  Still with elevated blood glucose.  Plan: 1) Continue with antibiotics. 2) Start Creon 36,000 units TID with meals. 3) Need tight control of DM.  LOS: 6 days   Shelly Shoultz D 03/04/2017, 1:03 PM

## 2017-03-04 NOTE — Progress Notes (Signed)
Inpatient Diabetes Program Recommendations  AACE/ADA: New Consensus Statement on Inpatient Glycemic Control (2015)  Target Ranges:  Prepandial:   less than 140 mg/dL      Peak postprandial:   less than 180 mg/dL (1-2 hours)      Critically ill patients:  140 - 180 mg/dL   Results for [name], [name] (MRN [id number]) as of 03/04/2017 08:15  Ref. Range 03/03/2017 07:22 03/03/2017 11:19 03/03/2017 16:41 03/03/2017 22:19  Glucose-Capillary Latest Ref Range: 65 - 99 mg/dL 102 (H) 585 (H) 95 277 (H)   Results for [name], [name] (MRN [id number]) as of 03/04/2017 08:15  Ref. Range 03/04/2017 07:47  Glucose-Capillary Latest Ref Range: 65 - 99 mg/dL 443 (H)    Admit with:DKA/ Diarrhea/ Perirectal Abscess/ Anemia  History:Type 1 DM  Home DM Meds:Lantus 16 units BID Humalog 8 units TID Humalog SSI  Current Insulin Orders:Novolog Sensitive Correction Scale/ SSI (0-9 units) TID AC       MD- Note patient received 8 units Lantus at 4pm on 12/03.  CBG much better yesterday (12/04) AM: 131 mg/dl.  Patient has Type 1 DM and does not make any endogenous insulin. Will likely need some basal insulin back to prevent DKA.  Please consider the following:  1. Start back Lantus 8 units daily  2. If PO Intake remains poor, may want to increase frequency of Novolog SSI to Q4 hours  3. Encouraged pt to call one of the local Endocrinology practices in [place] to get established for better DM care.  Does not have current Insulin Prescriptions.  Please make sure to give patient Rxs for Lantus and Humalog at time of discharge until pt can get established with ENDO practice.      --Will follow patient during hospitalization--  Ambrose Finland RN, MSN, CDE Diabetes Coordinator Inpatient Glycemic Control Team Team Pager: [phone] (8a-5p)

## 2017-03-04 NOTE — Progress Notes (Signed)
PT Cancellation Note  Patient Details Name: CLAUDIO GAILLARD MRN: [id number] DOB: [birth year]   Cancelled Treatment:    Reason Eval/Treat Not Completed: Medical issues which prohibited therapy, the patient is having Diarrhea/ has Flexiseal and declines mobility at this time. Would like to mobilize when no longer has issues.   Rada Hay 03/04/2017, 10:01 AM  Blanchard Kelch PT [phone]

## 2017-03-04 NOTE — Progress Notes (Signed)
3 Days Post-Op    CC:  Perirectal abscess  Subjective: Pt stable from our standpoint.  She has diarrhea and the Flexiseal is out, with recurrent soiling of the site.  She is crying and generally miserable.   Objective: Vital signs in last 24 hours: Temp:  [98.2 F (36.8 C)-99 F (37.2 C)] 98.3 F (36.8 C) (12/05 0232) Pulse Rate:  [99-104] 102 (12/05 0232) Resp:  [0-20] 20 (12/05 0232) BP: (99-123)/(60-80) 108/73 (12/05 0232) SpO2:  [95 %-99 %] 99 % (12/05 0232) Last BM Date: 03/04/17  Intake/Output from previous day: 12/04 0701 - 12/05 0700 In: 907.5 [P.O.:560; Blood:297.5; IV Piggyback:50] Out: 575 [Urine:375; Stool:200] Intake/Output this shift: Total I/O In: 340 [P.O.:240; IV Piggyback:100] Out: -   General appearance: alert, cooperative and crying with discomfort Skin: Skin color, texture, turgor normal. No rashes or lesions or open site is clean and beefy in color.   Lab Results:  Recent Labs    03/03/17 0324 03/04/17 0631  WBC 7.5 7.7  HGB 7.1* 8.7*  HCT 21.4* 26.5*  PLT 191 244    BMET Recent Labs    03/03/17 0324 03/04/17 0631  NA 136 135  K 3.9 4.1  CL 107 103  CO2 26 27  GLUCOSE 234* 338*  BUN 7 10  CREATININE 0.43* 0.42*  CALCIUM 7.5* 7.8*   PT/INR No results for input(s): LABPROT, INR in the last 72 hours.  Recent Labs  Lab 02/26/17 1645 03/01/17 0344  AST 13* 14*  ALT 11* 7*  ALKPHOS 181* 106  BILITOT 2.1* 0.4  PROT 7.8 6.1*  ALBUMIN 2.2* 1.4*     Lipase     Component Value Date/Time   LIPASE 53 (H) 05/13/2016 0026     Medications: . citalopram  10 mg Oral Daily  . feeding supplement (ENSURE ENLIVE)  237 mL Oral TID BM  . folic acid  1 mg Oral Daily  . insulin aspart  0-9 Units Subcutaneous TID WC  . insulin glargine  8 Units Subcutaneous QHS  . metroNIDAZOLE  500 mg Oral Q8H  . vancomycin  125 mg Oral Q6H   Anti-infectives (From admission, onward)   Start     Dose/Rate Route Frequency Ordered Stop   03/03/17 2200   ceFAZolin (ANCEF) IVPB 1 g/50 mL premix     1 g 100 mL/hr over 30 Minutes Intravenous Every 8 hours 03/03/17 1742     03/03/17 2200  metroNIDAZOLE (FLAGYL) tablet 500 mg     500 mg Oral Every 8 hours 03/03/17 1742     03/02/17 1800  vancomycin (VANCOCIN) 50 mg/mL oral solution 125 mg     125 mg Oral Every 6 hours 03/02/17 1521     02/28/17 1300  piperacillin-tazobactam (ZOSYN) IVPB 3.375 g  Status:  Discontinued     3.375 g 12.5 mL/hr over 240 Minutes Intravenous Every 8 hours 02/28/17 1227 03/03/17 1741      Assessment/Plan 1.  Perirectal abscess             On Zosyn             Looks like there is not any need for additional drainage.  Continue dressing changes.  2.  Diabetes - poorly managed             She was seeing Dr. Buddy Duty - but looking for another endocrinologist because of finances   Glucose 338 this AM 3. Chronic diarrhea and weight loss  Etiology unclear 4.  Anxiety 5.  Anemia             Hgb - 8.7 today 6.  Malnourished             Albumin - 1.4 - 03/01/2017 7.  Ascites/pleural effusion - seen on CT scan             Possibly based on poorly controlled diabetes and malnutrition              Principal Problem:   MDD (major depressive disorder), recurrent episode, mild (HCC) Active Problems:   Diabetes type 1, uncontrolled ([place])   Protein-calorie malnutrition, severe ([place])   DKA (diabetic ketoacidosis) (HCC)   Diarrhea   Lower GI bleed  FEN: Carb mod ID:  Flagyl/Ancef/ oral Vancomycin - C diff negative DVT:  Nothing list Follow up: Dr. Alphonsa Overall  Plan:  She does not want the [facility] but is having uncontrolled diarrhea.  I told nursing we can just irrigate the site with each BM, and try to keep it clean that way.  She could shower and do a Sitz bath but has not been OOB yet.      LOS: 6 days    Tannor Pyon 03/04/2017 [phone]

## 2017-03-04 NOTE — Progress Notes (Signed)
PROGRESS NOTE    [name]  WUJ:[id number] DOB: Nov 29, 1990 DOA: 02/26/2017 PCP: Andi Devon, MD    Brief Narrative: [name] is [name] 26 y.o. female with medical history significant of type 1 DM with h/o remote DKA; HTN; and gastroparesis presenting with AMS.  Today, her husband went to work early this AM.  They work at the same facility and she was due to come in later.  She called him about 11:15 and asked him to come home because she was "having [name] panic attack."  She specifically wanted him to help her.  She finally took Dyron Kawano nap for [name] couple of hours.  She soiled herself and he decided to take her to the doctor.  She was somewhat incoherent with garbled speech and hallucinations.  For example, she would try to say the word for bathroom and would forget the word, that kind of thing.  She soiled herself again.  He thought it might be [name] low blood sugar and instead it was high and her temperature was low.  She has been having diarrhea since last night - awoke overnight to go to the bathroom several times.  She does have soreness and rash on her buttocks.  She also complained of [name] boil on her bottom last week; he thought it was better but it was noticeable today.   She has had DKA, but the last time was [name] few years ago.  No sick contacts.  They work for [employer] and she has been complaining of being extra tired but they thought it was due to working [name] lot of overtime.  She did eat/drink normally yesterday.  She drank some water when her husband got home today.  She complained of n/v to him but he did not witness this.  Assessment & Plan:   Principal Problem:   MDD (major depressive disorder), recurrent episode, mild (HCC) Active Problems:   Diabetes type 1, uncontrolled (HCC)   Protein-calorie malnutrition, severe (HCC)   DKA (diabetic ketoacidosis) (HCC)   Diarrhea   Lower GI bleed   DKA with metabolic encephalopathy and  severe acidosis venous blood gas pH 6.8 on presentation:  -  Resolved - Pt with elevated BG's, will restart lantus, continue SSI (uptitrate as appropriate, she's on lantus 15 units BID and 8 units lispro TID with SSI  Poorly controlled type 1 diabetes -H/o Medication noncompliance -Patient reports not able to afford insulin, she reports one half brand insulin make her feel bloated. -a1c 12.2 -Diabetes educator and case manager consulted.  Hypoglycemia event   ? due to inconsistent oral intake (noted 12/3)  Continue adjust insulin dose CTM  periRectal abscess  -CT imaging with complex fluids collection -s/p debridement in OR on 12/2,  - surgical culture + Klebsiella (holding for anaerobes),  zosyn d/ced, changed to ancef, with flagyl to cover anaerobes -will follow general surgery recommendations  Acute on chronic anemia -Hemoglobin 6.4 on admission, status post PRBC transfusionx1 on November 30, prbc x1 on 12/4 -B12 unremarkable, folate slightly low, Start folic acid supplement -transfuse prn to keep hgb >7.  Chronic diarrhea//weight loss with anemia/malnutrition -Report followed by GI Dr. Loreta Ave -Ct ab Sherre Lain + abscess and ascites but no bowel wall thickening -GI ordered c diff which showed antigen positive, toxin negative. Due to chronic debilitating diarrhea will start oral vanc. start questran stopped due to interactive with oral vanc. - appreciate GI recs, started creon today - hold off on antimotility while treating for C dif -rectal  tube in place, but d/c'd today  Moisture associated skin damage around anal area  Likely from chronic diarrhea Wound care consulted  Hypokalemia /hypomagnesemia Replete k/mag  Malnutrition. moderate: CT ab/pel" Moderate volume ascites with diffuse soft tissue and body wall edema." Albumin 1.4 Nutrition consulted  Bilateral lower extremity edema: likely third spacing Venous doppler no DVT. D/c ivf Nutrition supplement.  Anxiety/ depression: Psych input appreciated, recommend celexa 10mg   daily for [name] few days, increase to 20mg  daily if able to tolerate.    DVT prophylaxis: SCD Code Status: full  Family Communication: none at bedside Disposition Plan: pending improvement in pain and diarrhea   Consultants:   Surgery and GI  Procedures: (Don't include imaging studies which can be auto populated. Include things that cannot be auto populated i.e. Echo, Carotid and venous dopplers, Foley, Bipap, HD, tubes/drains, wound vac, central lines etc)  Perirectal abscess debridement  Antimicrobials: (specify start and planned stop date. Auto populated tables are space occupying and do not give end dates)  Zosyn 12/1-12/4  Ancef/flagyl 12/4 -   Vancomycin oral 12/3-    Subjective: Pain in bottom at site of abscess.  Seems about the same.  Can't control it.  Present for months. Otherwise, no CP or SOB. Chronic R>L LEE.  Taking 15 units lantus BID at home with 8 units with meals with ssi.  Objective: Vitals:   03/03/17 2100 03/03/17 2200 03/03/17 2213 03/04/17 0232  BP:   107/61 108/73  Pulse: (!) 101  99 (!) 102  Resp: (!) 0  20 20  Temp:   98.2 F (36.8 C) 98.3 F (36.8 C)  TempSrc:  Oral Oral Oral  SpO2: 99%  98% 99%  Weight:      Height:        Intake/Output Summary (Last 24 hours) at 03/04/2017 1007 Last data filed at 03/04/2017 0900 Gross per 24 hour  Intake 1247.5 ml  Output 575 ml  Net 672.5 ml   Filed Weights   02/26/17 1708  Weight: 52.2 kg (115 lb)    Examination:  General exam: Appears calm and comfortable  Respiratory system: Clear to auscultation. Respiratory effort normal. Cardiovascular system: S1 & S2 heard, RRR. No JVD, murmurs, rubs, gallops or clicks.  Gastrointestinal system: Abdomen is nondistended, soft and nontender. No organomegaly or masses felt. Normal bowel sounds heard.  Flexiseal and foley in place.  Central nervous system: Alert and oriented. No focal neurological deficits. Extremities: R>L 1+ LEE.  Palpable DP  pulses. Skin: Perirectal surgical wound visualized.  Minimal purulent discharge on dressing.Marland Kitchen  Psychiatry: Judgement and insight appear normal. Mood & affect appropriate.     Data Reviewed: I have personally reviewed following labs and imaging studies  CBC: Recent Labs  Lab 02/27/17 1821 02/27/17 2348 02/28/17 0700 03/01/17 0344 03/02/17 0346 03/03/17 0324 03/04/17 0631  WBC 9.5 10.0 8.6 8.4 7.6 7.5 7.7  NEUTROABS 7.4 7.1 5.2 5.6  --   --  4.9  HGB 8.6* 9.0* 8.1* 8.2* 7.2* 7.1* 8.7*  HCT 25.4* 26.5* 23.8* 24.3* 21.6* 21.4* 26.5*  MCV 90.7 91.4 91.2 92.0 93.5 93.9 93.3  PLT 173 167 177 149* 157 191 244   Basic Metabolic Panel: Recent Labs  Lab 02/26/17 2011  02/28/17 0700 03/01/17 0344 03/02/17 0346 03/03/17 0324 03/04/17 0631  NA 139   < > 135 136 137 136 135  K 3.0*   < > 3.2* 3.6 3.4* 3.9 4.1  CL 116*   < > 109 106 107 107  103  CO2 <7*   < > 23 27 27 26 27   GLUCOSE 355*   < > 141* 75 68 234* 338*  BUN 8   < > <5* <5* <5* 7 10  CREATININE 0.82   < > <0.30* 0.40* <0.30* 0.43* 0.42*  CALCIUM 7.9*   < > 7.4* 7.6* 7.2* 7.5* 7.8*  MG 1.9  --   --  1.5* 1.9 2.0 1.8  PHOS 2.9  --   --   --   --   --   --    < > = values in this interval not displayed.   GFR: Estimated Creatinine Clearance: 87 mL/min (Calleigh Lafontant) (by C-G formula based on SCr of 0.42 mg/dL (L)). Liver Function Tests: Recent Labs  Lab 02/26/17 1645 03/01/17 0344  AST 13* 14*  ALT 11* 7*  ALKPHOS 181* 106  BILITOT 2.1* 0.4  PROT 7.8 6.1*  ALBUMIN 2.2* 1.4*   No results for input(s): LIPASE, AMYLASE in the last 168 hours. No results for input(s): AMMONIA in the last 168 hours. Coagulation Profile: Recent Labs  Lab 02/27/17 0015  INR 1.34   Cardiac Enzymes: No results for input(s): CKTOTAL, CKMB, CKMBINDEX, TROPONINI in the last 168 hours. BNP (last 3 results) No results for input(s): PROBNP in the last 8760 hours. HbA1C: No results for input(s): HGBA1C in the last 72 hours. CBG: Recent Labs   Lab 03/03/17 0722 03/03/17 1119 03/03/17 1641 03/03/17 2219 03/04/17 0747  GLUCAP 131* 179* 95 246* 278*   Lipid Profile: No results for input(s): CHOL, HDL, LDLCALC, TRIG, CHOLHDL, LDLDIRECT in the last 72 hours. Thyroid Function Tests: No results for input(s): TSH, T4TOTAL, FREET4, T3FREE, THYROIDAB in the last 72 hours. Anemia Panel: No results for input(s): VITAMINB12, FOLATE, FERRITIN, TIBC, IRON, RETICCTPCT in the last 72 hours. Sepsis Labs: Recent Labs  Lab 02/26/17 1659 02/27/17 0015 02/27/17 0355 02/28/17 0100 03/01/17 0344 03/02/17 0346  PROCALCITON  --  0.20  --  0.16 <0.10 <0.10  LATICACIDVEN 0.87 1.4 1.4  --   --   --     Recent Results (from the past 240 hour(s))  Blood Culture (routine x 2)     Status: None   Collection Time: 02/26/17  4:45 PM  Result Value Ref Range Status   Specimen Description BLOOD RIGHT ANTECUBITAL  Final   Special Requests   Final    BOTTLES DRAWN AEROBIC AND ANAEROBIC Blood Culture adequate volume   Culture   Final    NO GROWTH 5 DAYS Performed at [facility], [address], [place], [place] [address]    Report Status 03/03/2017 FINAL  Final  Blood Culture (routine x 2)     Status: Abnormal   Collection Time: 02/26/17  4:55 PM  Result Value Ref Range Status   Specimen Description BLOOD LEFT ANTECUBITAL  Final   Special Requests   Final    BOTTLES DRAWN AEROBIC AND ANAEROBIC Blood Culture adequate volume   Culture  Setup Time   Final    IN BOTH AEROBIC AND ANAEROBIC BOTTLES CRITICAL RESULT CALLED TO, READ BACK BY AND VERIFIED WITH: B. GREEN PHARMD, AT 2333 02/27/17 BY D. VANHOOK CORRECTED RESULTS GRAM POSITIVE RODS PREVIOUSLY REPORTED AS: GRAM POSITIVE COCCI IN CHAINS CORRECTED RESULTS CALLED TO: PHARMD M BELL [phone] MLM    Culture (Adream Parzych)  Final    LACTOBACILLUS SPECIES Standardized susceptibility testing for this organism is not available. Performed at [facility], [address],  [place], [place]  [address]    Report Status 03/02/2017 FINAL  Final  Blood Culture ID Panel (Reflexed)     Status: None   Collection Time: 02/26/17  4:55 PM  Result Value Ref Range Status   Enterococcus species NOT DETECTED NOT DETECTED Final   Listeria monocytogenes NOT DETECTED NOT DETECTED Final   Staphylococcus species NOT DETECTED NOT DETECTED Final   Staphylococcus aureus NOT DETECTED NOT DETECTED Final   Streptococcus species NOT DETECTED NOT DETECTED Final   Streptococcus agalactiae NOT DETECTED NOT DETECTED Final   Streptococcus pneumoniae NOT DETECTED NOT DETECTED Final   Streptococcus pyogenes NOT DETECTED NOT DETECTED Final   Acinetobacter baumannii NOT DETECTED NOT DETECTED Final   Enterobacteriaceae species NOT DETECTED NOT DETECTED Final   Enterobacter cloacae complex NOT DETECTED NOT DETECTED Final   Escherichia coli NOT DETECTED NOT DETECTED Final   Klebsiella oxytoca NOT DETECTED NOT DETECTED Final   Klebsiella pneumoniae NOT DETECTED NOT DETECTED Final   Proteus species NOT DETECTED NOT DETECTED Final   Serratia marcescens NOT DETECTED NOT DETECTED Final   Haemophilus influenzae NOT DETECTED NOT DETECTED Final   Neisseria meningitidis NOT DETECTED NOT DETECTED Final   Pseudomonas aeruginosa NOT DETECTED NOT DETECTED Final   Candida albicans NOT DETECTED NOT DETECTED Final   Candida glabrata NOT DETECTED NOT DETECTED Final   Candida krusei NOT DETECTED NOT DETECTED Final   Candida parapsilosis NOT DETECTED NOT DETECTED Final   Candida tropicalis NOT DETECTED NOT DETECTED Final    Comment: Performed at [facility], [address] [place], [place] [address]  C difficile quick scan w PCR reflex     Status: Abnormal   Collection Time: 02/26/17  7:45 PM  Result Value Ref Range Status   C Diff antigen POSITIVE (Bobby Barton) NEGATIVE Final   C Diff toxin NEGATIVE NEGATIVE Final   C Diff interpretation Results are indeterminate. See PCR results.  Final  Culture, blood (x 2)     Status: None  (Preliminary result)   Collection Time: 02/26/17 11:00 PM  Result Value Ref Range Status   Specimen Description BLOOD LEFT WRIST  Final   Special Requests   Final    BOTTLES DRAWN AEROBIC AND ANAEROBIC Blood Culture adequate volume   Culture   Final    NO GROWTH 4 DAYS Performed at [facility], [address] [place], [place] [address]    Report Status PENDING  Incomplete  Urine culture     Status: None   Collection Time: 02/26/17 11:00 PM  Result Value Ref Range Status   Specimen Description URINE, RANDOM  Final   Special Requests NONE  Final   Culture   Final    NO GROWTH Performed at [facility], [address] [place], [place] [address]    Report Status 02/28/2017 FINAL  Final  MRSA PCR Screening     Status: None   Collection Time: 02/26/17 11:00 PM  Result Value Ref Range Status   MRSA by PCR NEGATIVE NEGATIVE Final    Comment:        The GeneXpert MRSA Assay (FDA approved for NASAL specimens only), is one component of Andrews Tener comprehensive MRSA colonization surveillance program. It is not intended to diagnose MRSA infection nor to guide or monitor treatment for MRSA infections.   Culture, blood (x 2)     Status: None (Preliminary result)   Collection Time: 02/26/17 11:05 PM  Result Value Ref Range Status   Specimen Description  BLOOD LEFT HAND  Final   Special Requests IN PEDIATRIC BOTTLE Blood Culture adequate volume  Final   Culture   Final    NO GROWTH 4 DAYS Performed at [facility], [address] [place], [place] [address]    Report Status PENDING  Incomplete  Gastrointestinal Panel by PCR , Stool     Status: None   Collection Time: 02/27/17  8:49 AM  Result Value Ref Range Status   Campylobacter species NOT DETECTED NOT DETECTED Final   Plesimonas shigelloides NOT DETECTED NOT DETECTED Final   Salmonella species NOT DETECTED NOT DETECTED Final   Yersinia enterocolitica NOT DETECTED NOT DETECTED Final   Vibrio species NOT DETECTED NOT  DETECTED Final   Vibrio cholerae NOT DETECTED NOT DETECTED Final   Enteroaggregative E coli (EAEC) NOT DETECTED NOT DETECTED Final   Enteropathogenic E coli (EPEC) NOT DETECTED NOT DETECTED Final   Enterotoxigenic E coli (ETEC) NOT DETECTED NOT DETECTED Final   Shiga like toxin producing E coli (STEC) NOT DETECTED NOT DETECTED Final   Shigella/Enteroinvasive E coli (EIEC) NOT DETECTED NOT DETECTED Final   Cryptosporidium NOT DETECTED NOT DETECTED Final   Cyclospora cayetanensis NOT DETECTED NOT DETECTED Final   Entamoeba histolytica NOT DETECTED NOT DETECTED Final   Giardia lamblia NOT DETECTED NOT DETECTED Final   Adenovirus F40/41 NOT DETECTED NOT DETECTED Final   Astrovirus NOT DETECTED NOT DETECTED Final   Norovirus GI/GII NOT DETECTED NOT DETECTED Final   Rotavirus Andyn Sales NOT DETECTED NOT DETECTED Final   Sapovirus (I, II, IV, and V) NOT DETECTED NOT DETECTED Final  Aerobic/Anaerobic Culture (surgical/deep wound)     Status: None (Preliminary result)   Collection Time: 03/01/17 11:00 AM  Result Value Ref Range Status   Specimen Description ABSCESS  Final   Special Requests NONE  Final   Gram Stain   Final    ABUNDANT WBC PRESENT, PREDOMINANTLY PMN FEW GRAM POSITIVE RODS RARE GRAM POSITIVE COCCI IN PAIRS RARE GRAM NEGATIVE RODS    Culture   Final    FEW KLEBSIELLA PNEUMONIAE HOLDING FOR POSSIBLE ANAEROBE Performed at [facility], [address] [place], [place] [address]    Report Status PENDING  Incomplete   Organism ID, Bacteria KLEBSIELLA PNEUMONIAE  Final      Susceptibility   Klebsiella pneumoniae - MIC*    AMPICILLIN >=32 RESISTANT Resistant     CEFAZOLIN <=4 SENSITIVE Sensitive     CEFEPIME <=1 SENSITIVE Sensitive     CEFTAZIDIME <=1 SENSITIVE Sensitive     CEFTRIAXONE <=1 SENSITIVE Sensitive     CIPROFLOXACIN <=0.25 SENSITIVE Sensitive     GENTAMICIN <=1 SENSITIVE Sensitive     IMIPENEM <=0.25 SENSITIVE Sensitive     TRIMETH/SULFA <=20 SENSITIVE Sensitive       AMPICILLIN/SULBACTAM >=32 RESISTANT Resistant     PIP/TAZO 16 SENSITIVE Sensitive     Extended ESBL NEGATIVE Sensitive     * FEW KLEBSIELLA PNEUMONIAE  Gastrointestinal Panel by PCR , Stool     Status: None   Collection Time: 03/03/17  1:44 AM  Result Value Ref Range Status   Campylobacter species NOT DETECTED NOT DETECTED Final   Plesimonas shigelloides NOT DETECTED NOT DETECTED Final   Salmonella species NOT DETECTED NOT DETECTED Final   Yersinia enterocolitica NOT DETECTED NOT DETECTED Final   Vibrio species NOT DETECTED NOT DETECTED Final   Vibrio cholerae NOT DETECTED NOT DETECTED Final   Enteroaggregative E coli (EAEC) NOT DETECTED NOT DETECTED Final  Enteropathogenic E coli (EPEC) NOT DETECTED NOT DETECTED Final   Enterotoxigenic E coli (ETEC) NOT DETECTED NOT DETECTED Final   Shiga like toxin producing E coli (STEC) NOT DETECTED NOT DETECTED Final   Shigella/Enteroinvasive E coli (EIEC) NOT DETECTED NOT DETECTED Final   Cryptosporidium NOT DETECTED NOT DETECTED Final   Cyclospora cayetanensis NOT DETECTED NOT DETECTED Final   Entamoeba histolytica NOT DETECTED NOT DETECTED Final   Giardia lamblia NOT DETECTED NOT DETECTED Final   Adenovirus F40/41 NOT DETECTED NOT DETECTED Final   Astrovirus NOT DETECTED NOT DETECTED Final   Norovirus GI/GII NOT DETECTED NOT DETECTED Final   Rotavirus Mashanda Ishibashi NOT DETECTED NOT DETECTED Final   Sapovirus (I, II, IV, and V) NOT DETECTED NOT DETECTED Final         Radiology Studies: No results found.      Scheduled Meds: . citalopram  10 mg Oral Daily  . feeding supplement (ENSURE ENLIVE)  237 mL Oral TID BM  . folic acid  1 mg Oral Daily  . insulin aspart  0-9 Units Subcutaneous TID WC  . insulin glargine  8 Units Subcutaneous QHS  . metroNIDAZOLE  500 mg Oral Q8H  . vancomycin  125 mg Oral Q6H   Continuous Infusions: .  ceFAZolin (ANCEF) IV Stopped (03/04/17 0907)     LOS: 6 days    Time spent: over 30  minutes    Lacretia Nicks, MD [facility] Pager [phone]  If 7PM-7AM, please contact night-coverage [email or web address] Password [email or web address] 03/04/2017, 10:07 AM

## 2017-03-05 DIAGNOSIS — E0910 Drug or chemical induced diabetes mellitus with ketoacidosis without coma: Secondary | ICD-10-CM

## 2017-03-05 LAB — GLUCOSE, CAPILLARY
Glucose-Capillary: 189 mg/dL — ABNORMAL HIGH (ref 65–99)
Glucose-Capillary: 130 mg/dL — ABNORMAL HIGH (ref 65–99)
Glucose-Capillary: 160 mg/dL — ABNORMAL HIGH (ref 65–99)
Glucose-Capillary: 191 mg/dL — ABNORMAL HIGH (ref 65–99)

## 2017-03-05 LAB — BASIC METABOLIC PANEL
Anion gap: 5 (ref 5–15)
BUN: 7 mg/dL (ref 6–20)
CO2: 28 mmol/L (ref 22–32)
Calcium: 7.8 mg/dL — ABNORMAL LOW (ref 8.9–10.3)
Chloride: 103 mmol/L (ref 101–111)
Creatinine, Ser: <0.3 mg/dL — ABNORMAL LOW (ref 0.44–1.00)
Glucose, Bld: 188 mg/dL — ABNORMAL HIGH (ref 65–99)
Potassium: 4 mmol/L (ref 3.5–5.1)
Sodium: 136 mmol/L (ref 135–145)

## 2017-03-05 LAB — ALBUMIN: Albumin: 1.3 g/dL — ABNORMAL LOW (ref 3.5–5.0)

## 2017-03-05 LAB — CBC
HCT: 25 % — ABNORMAL LOW (ref 36.0–46.0)
Hemoglobin: 8.3 g/dL — ABNORMAL LOW (ref 12.0–15.0)
MCH: 31.1 pg (ref 26.0–34.0)
MCHC: 33.2 g/dL (ref 30.0–36.0)
MCV: 93.6 fL (ref 78.0–100.0)
Platelets: 300 10*3/uL (ref 150–400)
RBC: 2.67 MIL/uL — ABNORMAL LOW (ref 3.87–5.11)
RDW: 14.7 % (ref 11.5–15.5)
WBC: 7.7 10*3/uL (ref 4.0–10.5)

## 2017-03-05 NOTE — Progress Notes (Signed)
PT Cancellation Note  Patient Details Name: [name] MRN: [id number] DOB: April 24, 1990   Cancelled Treatment:    Reason Eval/Treat Not Completed: Medical issues which prohibited therapy. Patient reports that she did ambulate to BR with assistance. Still having diarrhea. Check back when able to ambulate without diarrhea.  [place] [facility] [phone]  Rada Hay 03/05/2017, 11:44 AM

## 2017-03-05 NOTE — Progress Notes (Signed)
Date: March 05, 2017 Yifan Auker, BSN, RN3, CCM [phone] Chart and notes review for patient progress and needs. Will follow for case management and discharge needs. Next review date: [id number] 

## 2017-03-05 NOTE — Progress Notes (Signed)
Subjective: Still with diarrhea, but it is not as watery.  Objective: Vital signs in last 24 hours: Temp:  [97.8 F (36.6 C)-98.5 F (36.9 C)] 97.8 F (36.6 C) (12/06 1120) Pulse Rate:  [81-98] 96 (12/06 1120) Resp:  [16-18] 18 (12/06 0700) BP: (97-124)/(68-89) 122/82 (12/06 1120) SpO2:  [98 %-100 %] 98 % (12/06 0700) Last BM Date: 03/05/17  Intake/Output from previous day: 12/05 0701 - 12/06 0700 In: 730 [P.O.:480; IV Piggyback:250] Out: 1650 [Urine:1450; Stool:200] Intake/Output this shift: Total I/O In: 480 [P.O.:480] Out: -   General appearance: alert and moderate distress GI: soft, non-tender; bowel sounds normal; no masses,  no organomegaly  Lab Results: Recent Labs    03/03/17 0324 03/04/17 0631 03/05/17 0539  WBC 7.5 7.7 7.7  HGB 7.1* 8.7* 8.3*  HCT 21.4* 26.5* 25.0*  PLT 191 244 300   BMET Recent Labs    03/03/17 0324 03/04/17 0631 03/05/17 0546  NA 136 135 136  K 3.9 4.1 4.0  CL 107 103 103  CO2 26 27 28   GLUCOSE 234* 338* 188*  BUN 7 10 7   CREATININE 0.43* 0.42* <0.30*  CALCIUM 7.5* 7.8* 7.8*   LFT Recent Labs    03/05/17 0546  ALBUMIN 1.3*   PT/INR No results for input(s): LABPROT, INR in the last 72 hours. Hepatitis Panel No results for input(s): HEPBSAG, HCVAB, HEPAIGM, HEPBIGM in the last 72 hours. C-Diff No results for input(s): CDIFFTOX in the last 72 hours. Fecal Lactopherrin No results for input(s): FECLLACTOFRN in the last 72 hours.  Studies/Results: No results found.  Medications:  Scheduled: . citalopram  10 mg Oral Daily  . feeding supplement (ENSURE ENLIVE)  237 mL Oral TID BM  . folic acid  1 mg Oral Daily  . insulin aspart  0-9 Units Subcutaneous TID WC  . insulin glargine  8 Units Subcutaneous Daily  . lipase/protease/amylase  36,000 Units Oral TID WC  . metroNIDAZOLE  500 mg Oral Q8H  . vancomycin  125 mg Oral Q6H   Continuous: .  ceFAZolin (ANCEF) IV Stopped (03/05/17 1347)    Assessment/Plan: 1)  Diarrhea - some improvement. 2) ? Pancreatic insufficiency. 3) DM - improving control.   There is some inkling that her diarrhea is improving with Creon.  She still has diarrhea, but there it is not as liquid.  This may be indicative of pancreatic insufficiency.  She will benefit with a Flexiseal at this time.  The stool is still liquid enough to pass into the tubing.  It can certainly be discontinued if she continues to have improvement with her stooling.  Plan: 1) Flexiseal. 2) Discontinue vancomycin. 3) Continue with Creon. 4) Continue with tight blood sugar control.  LOS: 7 days   Tinea Nobile D 03/05/2017, 1:35 PM

## 2017-03-05 NOTE — Progress Notes (Signed)
PROGRESS NOTE    [name]  QJF:[id number] DOB: June 23, 1990 DOA: 02/26/2017 PCP: [name], MD    Brief Narrative: [name] is a 26 y.o. female with medical history significant of type 1 DM with h/o remote DKA; HTN; and gastroparesis presenting with AMS.  Today, her husband went to work early this AM.  They work at the same facility and she was due to come in later.  She called him about 11:15 and asked him to come home because she was "having a panic attack."  She specifically wanted him to help her.  She finally took a nap for a couple of hours.  She soiled herself and he decided to take her to the doctor.  She was somewhat incoherent with garbled speech and hallucinations.  For example, she would try to say the word for bathroom and would forget the word, that kind of thing.  She soiled herself again.  He thought it might be a low blood sugar and instead it was high and her temperature was low.  She has been having diarrhea since last night - awoke overnight to go to the bathroom several times.  She does have soreness and rash on her buttocks.  She also complained of a boil on her bottom last week; he thought it was better but it was noticeable today.   She has had DKA, but the last time was a few years ago.  No sick contacts.  They work for [employer] and she has been complaining of being extra tired but they thought it was due to working a lot of overtime.  She did eat/drink normally yesterday.  She drank some water when her husband got home today.  She complained of n/v to him but he did not witness this.  Assessment & Plan:   Principal Problem:   MDD (major depressive disorder), recurrent episode, mild (HCC) Active Problems:   Diabetes type 1, uncontrolled (HCC)   Protein-calorie malnutrition, severe ([facility])   DKA (diabetic ketoacidosis) (HCC)   Diarrhea   Lower GI bleed   DKA with metabolic encephalopathy and  severe acidosis venous blood gas pH 6.8 on presentation:  -  Resolved - Pt with elevated BG's, will restart lantus, continue SSI (uptitrate as appropriate, she's on lantus 15 units BID and 8 units lispro TID with SSI  Poorly controlled type 1 diabetes -H/o Medication noncompliance -Patient reports not able to afford insulin, she reports one half brand insulin make her feel bloated. -a1c 12.2 -Diabetes educator and case manager consulted. - Improved BG's this morning starting lantus 8 units, continue to monitor  Hypoglycemia event   ? due to inconsistent oral intake (noted 12/3)  Continue adjust insulin dose CTM  periRectal abscess  -CT imaging with complex fluids collection -s/p debridement in OR on 12/2,  - surgical culture + Klebsiella (holding for anaerobes),  zosyn d/ced, changed to ancef, with flagyl to cover anaerobes -will follow general surgery recommendations  Acute on chronic anemia -Hemoglobin 6.4 on admission, status post PRBC transfusionx1 on November 30, prbc x1 on 12/4 -B12 unremarkable, folate slightly low, Start folic acid supplement -transfuse prn to keep hgb >7.  Chronic diarrhea//weight loss with anemia/malnutrition -Report followed by GI Dr. Collene Mares -Ct ab [facility]ie + abscess and ascites but no bowel wall thickening -GI ordered c diff which showed antigen positive, toxin negative. GI stopped oral vanc today given posible improvement with creon.   - appreciate GI recs, started creon 12/5 - hold off on antimotility  for now -replace rectal tube - of note, positive hemoccult, but brown stool.  Continue to monitor.  Moisture associated skin damage around anal area  Likely from chronic diarrhea Wound care consulted  Hypokalemia /hypomagnesemia Replete k/mag  Malnutrition. moderate: CT ab/pel" Moderate volume ascites with diffuse soft tissue and body wall edema." Albumin 1.4 Nutrition consulted  Bilateral lower extremity edema: likely third spacing Venous doppler no DVT. D/c ivf Nutrition supplement.  Anxiety/  depression: Psych input appreciated, recommend celexa 61m daily for a few days, increase to 229mdaily if able to tolerate.   DVT prophylaxis: SCD Code Status: full  Family Communication: none at bedside Disposition Plan: pending improvement in pain and diarrhea   Consultants:   Surgery and GI  Procedures: (Don't include imaging studies which can be auto populated. Include things that cannot be auto populated i.e. Echo, Carotid and venous dopplers, Foley, Bipap, HD, tubes/drains, wound vac, central lines etc)  Perirectal abscess debridement  Antimicrobials: (specify start and planned stop date. Auto populated tables are space occupying and do not give end dates)  Zosyn 12/1-12/4  Ancef/flagyl 12/4 -   Vancomycin oral 12/3-    Subjective: Pain at her bottom. Diarrhea persistent, maybe a bit better?  Objective: Vitals:   03/04/17 1544 03/04/17 2128 03/05/17 0553 03/05/17 0700  BP: 97/68 109/77 124/89 121/84  Pulse: 81 97 98 97  Resp:  16 18 18   Temp: 97.9 F (36.6 C) 98.5 F (36.9 C) 98 F (36.7 C) 97.8 F (36.6 C)  TempSrc: Oral Oral Oral Oral  SpO2: 100% 99% 98% 98%  Weight:      Height:        Intake/Output Summary (Last 24 hours) at 03/05/2017 1018 Last data filed at 03/05/2017 065003ross per 24 hour  Intake 390 ml  Output 1650 ml  Net -1260 ml   Filed Weights   02/26/17 1708  Weight: 52.2 kg (115 lb)    Examination:  General: No acute distress. Cardiovascular: Heart sounds show a regular rate, and rhythm. No gallops or rubs. No murmurs. No JVD. Lungs: Clear to auscultation bilaterally with good air movement. No rales, rhonchi or wheezes. Abdomen: Soft, nontender, nondistended with normal active bowel sounds. No masses. No hepatosplenomegaly. Neurological: Alert and oriented 3. Moves all extremities 4 with equal strength. Cranial nerves II through XII grossly intact. Skin: Intact dressing. Extremities: No clubbing or cyanosis. R>L 1+ LEE.    Psychiatric: Mood and affect are normal. Insight and judgment are appropriate.  Data Reviewed: I have personally reviewed following labs and imaging studies  CBC: Recent Labs  Lab 02/27/17 1821 02/27/17 2348 02/28/17 0700 03/01/17 0344 03/02/17 0346 03/03/17 0324 03/04/17 0631  WBC 9.5 10.0 8.6 8.4 7.6 7.5 7.7  NEUTROABS 7.4 7.1 5.2 5.6  --   --  4.9  HGB 8.6* 9.0* 8.1* 8.2* 7.2* 7.1* 8.7*  HCT 25.4* 26.5* 23.8* 24.3* 21.6* 21.4* 26.5*  MCV 90.7 91.4 91.2 92.0 93.5 93.9 93.3  PLT 173 167 177 149* 157 191 [id number] Basic Metabolic Panel: Recent Labs  Lab 02/26/17 2011  03/01/17 0344 03/02/17 0346 03/03/17 0324 03/04/17 0631 03/05/17 0546  NA 139   < > 136 137 136 135 136  K 3.0*   < > 3.6 3.4* 3.9 4.1 4.0  CL 116*   < > 106 107 107 103 103  CO2 <7*   < > 27 27 26 27 28   GLUCOSE 355*   < > 75 68 234* 338* 188*  BUN 8   < > <5* <5* 7 10 7   CREATININE 0.82   < > 0.40* <0.30* 0.43* 0.42* <0.30*  CALCIUM 7.9*   < > 7.6* 7.2* 7.5* 7.8* 7.8*  MG 1.9  --  1.5* 1.9 2.0 1.8  --   PHOS 2.9  --   --   --   --   --   --    < > = values in this interval not displayed.   GFR: CrCl cannot be calculated (This lab value cannot be used to calculate CrCl because it is not a number: <0.30). Liver Function Tests: Recent Labs  Lab 02/26/17 1645 03/01/17 0344 03/05/17 0546  AST 13* 14*  --   ALT 11* 7*  --   ALKPHOS 181* 106  --   BILITOT 2.1* 0.4  --   PROT 7.8 6.1*  --   ALBUMIN 2.2* 1.4* 1.3*   No results for input(s): LIPASE, AMYLASE in the last 168 hours. No results for input(s): AMMONIA in the last 168 hours. Coagulation Profile: Recent Labs  Lab 02/27/17 0015  INR 1.34   Cardiac Enzymes: No results for input(s): CKTOTAL, CKMB, CKMBINDEX, TROPONINI in the last 168 hours. BNP (last 3 results) No results for input(s): PROBNP in the last 8760 hours. HbA1C: No results for input(s): HGBA1C in the last 72 hours. CBG: Recent Labs  Lab 03/04/17 0747 03/04/17 1133  03/04/17 1723 03/04/17 2136 03/05/17 0738  GLUCAP 278* 271* 128* 130* 189*   Lipid Profile: No results for input(s): CHOL, HDL, LDLCALC, TRIG, CHOLHDL, LDLDIRECT in the last 72 hours. Thyroid Function Tests: No results for input(s): TSH, T4TOTAL, FREET4, T3FREE, THYROIDAB in the last 72 hours. Anemia Panel: No results for input(s): VITAMINB12, FOLATE, FERRITIN, TIBC, IRON, RETICCTPCT in the last 72 hours. Sepsis Labs: Recent Labs  Lab 02/26/17 1659 02/27/17 0015 02/27/17 0355 02/28/17 0100 03/01/17 0344 03/02/17 0346  PROCALCITON  --  0.20  --  0.16 <0.10 <0.10  LATICACIDVEN 0.87 1.4 1.4  --   --   --     Recent Results (from the past 240 hour(s))  Blood Culture (routine x 2)     Status: None   Collection Time: 02/26/17  4:45 PM  Result Value Ref Range Status   Specimen Description BLOOD RIGHT ANTECUBITAL  Final   Special Requests   Final    BOTTLES DRAWN AEROBIC AND ANAEROBIC Blood Culture adequate volume   Culture   Final    NO GROWTH 5 DAYS Performed at [facility], [place] [address], [place], [place] [address]    Report Status 03/03/2017 FINAL  Final  Blood Culture (routine x 2)     Status: Abnormal   Collection Time: 02/26/17  4:55 PM  Result Value Ref Range Status   Specimen Description BLOOD LEFT ANTECUBITAL  Final   Special Requests   Final    BOTTLES DRAWN AEROBIC AND ANAEROBIC Blood Culture adequate volume   Culture  Setup Time   Final    IN BOTH AEROBIC AND ANAEROBIC BOTTLES CRITICAL RESULT CALLED TO, READ BACK BY AND VERIFIED WITH: B. GREEN PHARMD, AT 2333 02/27/17 BY D. VANHOOK CORRECTED RESULTS GRAM POSITIVE RODS PREVIOUSLY REPORTED AS: GRAM POSITIVE COCCI IN CHAINS CORRECTED RESULTS CALLED TO: PHARMD M BELL [id number] MLM    Culture (A)  Final    LACTOBACILLUS SPECIES Standardized susceptibility testing for this organism is not available. Performed at [facility], [place] [address], [place], [place] [address]  Report Status 03/02/2017  FINAL  Final  Blood Culture ID Panel (Reflexed)     Status: None   Collection Time: 02/26/17  4:55 PM  Result Value Ref Range Status   Enterococcus species NOT DETECTED NOT DETECTED Final   Listeria monocytogenes NOT DETECTED NOT DETECTED Final   Staphylococcus species NOT DETECTED NOT DETECTED Final   Staphylococcus aureus NOT DETECTED NOT DETECTED Final   Streptococcus species NOT DETECTED NOT DETECTED Final   Streptococcus agalactiae NOT DETECTED NOT DETECTED Final   Streptococcus pneumoniae NOT DETECTED NOT DETECTED Final   Streptococcus pyogenes NOT DETECTED NOT DETECTED Final   Acinetobacter baumannii NOT DETECTED NOT DETECTED Final   Enterobacteriaceae species NOT DETECTED NOT DETECTED Final   Enterobacter cloacae complex NOT DETECTED NOT DETECTED Final   Escherichia coli NOT DETECTED NOT DETECTED Final   Klebsiella oxytoca NOT DETECTED NOT DETECTED Final   Klebsiella pneumoniae NOT DETECTED NOT DETECTED Final   Proteus species NOT DETECTED NOT DETECTED Final   Serratia marcescens NOT DETECTED NOT DETECTED Final   Haemophilus influenzae NOT DETECTED NOT DETECTED Final   Neisseria meningitidis NOT DETECTED NOT DETECTED Final   Pseudomonas aeruginosa NOT DETECTED NOT DETECTED Final   Candida albicans NOT DETECTED NOT DETECTED Final   Candida glabrata NOT DETECTED NOT DETECTED Final   Candida krusei NOT DETECTED NOT DETECTED Final   Candida parapsilosis NOT DETECTED NOT DETECTED Final   Candida tropicalis NOT DETECTED NOT DETECTED Final    Comment: Performed at [facility], [address], [place], [place] [address]  C difficile quick scan w PCR reflex     Status: Abnormal   Collection Time: 02/26/17  7:45 PM  Result Value Ref Range Status   C Diff antigen POSITIVE (A) NEGATIVE Final   C Diff toxin NEGATIVE NEGATIVE Final   C Diff interpretation Results are indeterminate. See PCR results.  Final  Culture, blood (x 2)     Status: None   Collection Time: 02/26/17 11:00 PM   Result Value Ref Range Status   Specimen Description BLOOD LEFT WRIST  Final   Special Requests   Final    BOTTLES DRAWN AEROBIC AND ANAEROBIC Blood Culture adequate volume   Culture   Final    NO GROWTH 5 DAYS Performed at [facility], [address] [place], [place] [address]    Report Status 03/04/2017 FINAL  Final  Urine culture     Status: None   Collection Time: 02/26/17 11:00 PM  Result Value Ref Range Status   Specimen Description URINE, RANDOM  Final   Special Requests NONE  Final   Culture   Final    NO GROWTH Performed at [facility], [place] [address], [place], [place] [address]    Report Status 02/28/2017 FINAL  Final  MRSA PCR Screening     Status: None   Collection Time: 02/26/17 11:00 PM  Result Value Ref Range Status   MRSA by PCR NEGATIVE NEGATIVE Final    Comment:        The GeneXpert MRSA Assay (FDA approved for NASAL specimens only), is one component of a comprehensive MRSA colonization surveillance program. It is not intended to diagnose MRSA infection nor to guide or monitor treatment for MRSA infections.   Culture, blood (x 2)     Status: None   Collection Time: 02/26/17 11:05 PM  Result Value Ref Range Status   Specimen Description BLOOD LEFT HAND  Final   Special Requests  IN PEDIATRIC BOTTLE Blood Culture adequate volume  Final   Culture   Final    NO GROWTH 5 DAYS Performed at [facility], [place] [address] [place], [place] [address]    Report Status 03/04/2017 FINAL  Final  Gastrointestinal Panel by PCR , Stool     Status: None   Collection Time: 02/27/17  8:49 AM  Result Value Ref Range Status   Campylobacter species NOT DETECTED NOT DETECTED Final   Plesimonas shigelloides NOT DETECTED NOT DETECTED Final   Salmonella species NOT DETECTED NOT DETECTED Final   Yersinia enterocolitica NOT DETECTED NOT DETECTED Final   Vibrio species NOT DETECTED NOT DETECTED Final   Vibrio cholerae NOT DETECTED NOT DETECTED Final    Enteroaggregative E coli (EAEC) NOT DETECTED NOT DETECTED Final   Enteropathogenic E coli (EPEC) NOT DETECTED NOT DETECTED Final   Enterotoxigenic E coli (ETEC) NOT DETECTED NOT DETECTED Final   Shiga like toxin producing E coli (STEC) NOT DETECTED NOT DETECTED Final   Shigella/Enteroinvasive E coli (EIEC) NOT DETECTED NOT DETECTED Final   Cryptosporidium NOT DETECTED NOT DETECTED Final   Cyclospora cayetanensis NOT DETECTED NOT DETECTED Final   Entamoeba histolytica NOT DETECTED NOT DETECTED Final   Giardia lamblia NOT DETECTED NOT DETECTED Final   Adenovirus F40/41 NOT DETECTED NOT DETECTED Final   Astrovirus NOT DETECTED NOT DETECTED Final   Norovirus GI/GII NOT DETECTED NOT DETECTED Final   Rotavirus A NOT DETECTED NOT DETECTED Final   Sapovirus (I, II, IV, and V) NOT DETECTED NOT DETECTED Final  Aerobic/Anaerobic Culture (surgical/deep wound)     Status: None (Preliminary result)   Collection Time: 03/01/17 11:00 AM  Result Value Ref Range Status   Specimen Description ABSCESS  Final   Special Requests NONE  Final   Gram Stain   Final    ABUNDANT WBC PRESENT, PREDOMINANTLY PMN FEW GRAM POSITIVE RODS RARE GRAM POSITIVE COCCI IN PAIRS RARE GRAM NEGATIVE RODS    Culture   Final    FEW KLEBSIELLA PNEUMONIAE HOLDING FOR POSSIBLE ANAEROBE Performed at [facility], [address] [place], [place] [address]    Report Status PENDING  Incomplete   Organism ID, Bacteria KLEBSIELLA PNEUMONIAE  Final      Susceptibility   Klebsiella pneumoniae - MIC*    AMPICILLIN >=32 RESISTANT Resistant     CEFAZOLIN <=4 SENSITIVE Sensitive     CEFEPIME <=1 SENSITIVE Sensitive     CEFTAZIDIME <=1 SENSITIVE Sensitive     CEFTRIAXONE <=1 SENSITIVE Sensitive     CIPROFLOXACIN <=0.25 SENSITIVE Sensitive     GENTAMICIN <=1 SENSITIVE Sensitive     IMIPENEM <=0.25 SENSITIVE Sensitive     TRIMETH/SULFA <=20 SENSITIVE Sensitive     AMPICILLIN/SULBACTAM >=32 RESISTANT Resistant     PIP/TAZO 16  SENSITIVE Sensitive     Extended ESBL NEGATIVE Sensitive     * FEW KLEBSIELLA PNEUMONIAE  Gastrointestinal Panel by PCR , Stool     Status: None   Collection Time: 03/03/17  1:44 AM  Result Value Ref Range Status   Campylobacter species NOT DETECTED NOT DETECTED Final   Plesimonas shigelloides NOT DETECTED NOT DETECTED Final   Salmonella species NOT DETECTED NOT DETECTED Final   Yersinia enterocolitica NOT DETECTED NOT DETECTED Final   Vibrio species NOT DETECTED NOT DETECTED Final   Vibrio cholerae NOT DETECTED NOT DETECTED Final   Enteroaggregative E coli (EAEC) NOT DETECTED NOT DETECTED Final   Enteropathogenic E coli (EPEC) NOT DETECTED NOT  DETECTED Final   Enterotoxigenic E coli (ETEC) NOT DETECTED NOT DETECTED Final   Shiga like toxin producing E coli (STEC) NOT DETECTED NOT DETECTED Final   Shigella/Enteroinvasive E coli (EIEC) NOT DETECTED NOT DETECTED Final   Cryptosporidium NOT DETECTED NOT DETECTED Final   Cyclospora cayetanensis NOT DETECTED NOT DETECTED Final   Entamoeba histolytica NOT DETECTED NOT DETECTED Final   Giardia lamblia NOT DETECTED NOT DETECTED Final   Adenovirus F40/41 NOT DETECTED NOT DETECTED Final   Astrovirus NOT DETECTED NOT DETECTED Final   Norovirus GI/GII NOT DETECTED NOT DETECTED Final   Rotavirus A NOT DETECTED NOT DETECTED Final   Sapovirus (I, II, IV, and V) NOT DETECTED NOT DETECTED Final  Clostridium Difficile by PCR     Status: None   Collection Time: 03/04/17  9:00 AM  Result Value Ref Range Status   Toxigenic C Difficile by pcr NEGATIVE NEGATIVE Final    Comment: Patient is colonized with non toxigenic C. difficile. May not need treatment unless significant symptoms are present. Performed at [facility], [place] [address] [place], [place] [address]          Radiology Studies: No results found.      Scheduled Meds: . citalopram  10 mg Oral Daily  . feeding supplement (ENSURE ENLIVE)  237 mL Oral TID BM  . folic acid  1  mg Oral Daily  . insulin aspart  0-9 Units Subcutaneous TID WC  . insulin glargine  8 Units Subcutaneous Daily  . lipase/protease/amylase  36,000 Units Oral TID WC  . metroNIDAZOLE  500 mg Oral Q8H  . vancomycin  125 mg Oral Q6H   Continuous Infusions: .  ceFAZolin (ANCEF) IV 1 g (03/05/17 0605)     LOS: 7 days    Time spent: over 20 minutes    Fayrene Helper, MD [facility] Pager [phone]  If 7PM-7AM, please contact night-coverage [email or web address] Password [email or web address] 03/05/2017, 10:18 AM

## 2017-03-06 LAB — BASIC METABOLIC PANEL
Anion gap: 5 (ref 5–15)
BUN: 8 mg/dL (ref 6–20)
CO2: 30 mmol/L (ref 22–32)
Calcium: 7.7 mg/dL — ABNORMAL LOW (ref 8.9–10.3)
Chloride: 105 mmol/L (ref 101–111)
Creatinine, Ser: 0.33 mg/dL — ABNORMAL LOW (ref 0.44–1.00)
GFR calc Af Amer: >60 mL/min (ref >60)
GFR calc non Af Amer: >60 mL/min (ref >60)
Glucose, Bld: 163 mg/dL — ABNORMAL HIGH (ref 65–99)
Potassium: 3.7 mmol/L (ref 3.5–5.1)
Sodium: 140 mmol/L (ref 135–145)

## 2017-03-06 LAB — CBC
HCT: 24.4 % — ABNORMAL LOW (ref 36.0–46.0)
Hemoglobin: 8.1 g/dL — ABNORMAL LOW (ref 12.0–15.0)
MCH: 31.2 pg (ref 26.0–34.0)
MCHC: 33.2 g/dL (ref 30.0–36.0)
MCV: 93.8 fL (ref 78.0–100.0)
Platelets: 317 10*3/uL (ref 150–400)
RBC: 2.6 MIL/uL — ABNORMAL LOW (ref 3.87–5.11)
RDW: 14.6 % (ref 11.5–15.5)
WBC: 6.3 10*3/uL (ref 4.0–10.5)

## 2017-03-06 LAB — GLUCOSE, CAPILLARY
Glucose-Capillary: 226 mg/dL — ABNORMAL HIGH (ref 65–99)
Glucose-Capillary: 213 mg/dL — ABNORMAL HIGH (ref 65–99)
Glucose-Capillary: 246 mg/dL — ABNORMAL HIGH (ref 65–99)
Glucose-Capillary: 224 mg/dL — ABNORMAL HIGH (ref 65–99)

## 2017-03-06 MED ORDER — BOOST / RESOURCE BREEZE PO LIQD CUSTOM
1.0000 | ORAL | Status: DC
  Administered 2017-03-06: 1 via ORAL

## 2017-03-06 MED ORDER — DIPHENOXYLATE-ATROPINE 2.5-0.025 MG PO TABS
2.0000 | ORAL_TABLET | Freq: Three times a day (TID) | ORAL | Status: DC
  Administered 2017-03-06 (×2): 2 via ORAL
  Filled 2017-03-06 (×2): qty 2

## 2017-03-06 MED ORDER — PREMIER PROTEIN SHAKE
11.0000 [oz_av] | ORAL | Status: DC
  Administered 2017-03-06 – 2017-03-12 (×2): 11 [oz_av] via ORAL
  Filled 2017-03-06 (×8): qty 325.31

## 2017-03-06 MED ORDER — CEPHALEXIN 500 MG PO CAPS
500.0000 mg | ORAL_CAPSULE | Freq: Four times a day (QID) | ORAL | Status: DC
  Administered 2017-03-06 – 2017-03-13 (×28): 500 mg via ORAL
  Filled 2017-03-06 (×29): qty 1

## 2017-03-06 NOTE — Progress Notes (Signed)
PROGRESS NOTE    [name]  ZMO:[id number] DOB: 01/22/1991 DOA: 02/26/2017 PCP: [name], MD    Brief Narrative: [name] is a 26 y.o. female with medical history significant of type 1 DM with h/o remote DKA; HTN; and gastroparesis presenting with AMS.  Today, her husband went to work early this AM.  They work at the same facility and she was due to come in later.  She called him about 11:15 and asked him to come home because she was "having a panic attack."  She specifically wanted him to help her.  She finally took a nap for a couple of hours.  She soiled herself and he decided to take her to the doctor.  She was somewhat incoherent with garbled speech and hallucinations.  For example, she would try to say the word for bathroom and would forget the word, that kind of thing.  She soiled herself again.  He thought it might be a low blood sugar and instead it was high and her temperature was low.  She has been having diarrhea since last night - awoke overnight to go to the bathroom several times.  She does have soreness and rash on her buttocks.  She also complained of a boil on her bottom last week; he thought it was better but it was noticeable today.   She has had DKA, but the last time was a few years ago.  No sick contacts.  They work for [employer] and she has been complaining of being extra tired but they thought it was due to working a lot of overtime.  She did eat/drink normally yesterday.  She drank some water when her husband got home today.  She complained of n/v to him but he did not witness this.  Assessment & Plan:   Principal Problem:   MDD (major depressive disorder), recurrent episode, mild (HCC) Active Problems:   Diabetes type 1, uncontrolled (HCC)   Protein-calorie malnutrition, severe ([facility])   DKA (diabetic ketoacidosis) (HCC)   Diarrhea   Lower GI bleed   DKA with metabolic encephalopathy and  severe acidosis venous blood gas pH 6.8 on presentation:  -  Resolved - Pt with elevated BG's, will restart lantus, continue SSI (uptitrate as appropriate, she's on lantus 15 units BID and 8 units lispro TID with SSI  Poorly controlled type 1 diabetes -H/o Medication noncompliance -Patient reports not able to afford insulin, she reports one half brand insulin make her feel bloated. -a1c 12.2 -Diabetes educator and case manager consulted. - Improved BG's this morning starting lantus 8 units, continue to monitor  Hypoglycemia event   ? due to inconsistent oral intake (noted 12/3)  Continue adjust insulin dose CTM  periRectal abscess  -CT imaging with complex fluids collection -s/p debridement in OR on 12/2,  - surgical culture + Klebsiella (holding for anaerobes),  zosyn d/ced, changed to ancef, with flagyl to cover anaerobes, change to oral abx , not that patient is improving, plan to treat for total of 14days. Last dose of abx on 12/14. -will follow general surgery recommendations  Acute on chronic anemia -Hemoglobin 6.4 on admission, status post PRBC transfusionx1 on November 30, prbc x1 on 12/4 -B12 unremarkable, folate slightly low, Start folic acid supplement -transfuse prn to keep hgb >7. -FOBT+, Case discussed with GI Dr Benson [name]  Chronic diarrhea//weight loss with anemia/malnutrition -Report followed by GI Dr. Collene Mares -Ct ab [facility] Curie + abscess and ascites but no bowel wall thickening -GI ordered c diff  which showed antigen positive, toxin negative. GI stopped oral vanc today given posible improvement with creon.   - appreciate GI recs, started creon 12/5 - -replace rectal tube   Moisture associated skin damage around anal area  Likely from chronic diarrhea Wound care consulted  Hypokalemia /hypomagnesemia Replete k/mag  Malnutrition. moderate: CT ab/pel" Moderate volume ascites with diffuse soft tissue and body wall edema." Albumin 1.4 Nutrition consulted  Bilateral lower extremity edema: likely third spacing Venous doppler  no DVT. D/c ivf Nutrition supplement.  Anxiety/ depression: Psych input appreciated, recommend celexa 68m daily for a few days, increase to 249mdaily if able to tolerate.   DVT prophylaxis: SCD Code Status: full  Family Communication: husband at bedside Disposition Plan: pending improvement in pain and diarrhea   Consultants:   Surgery and GI  Procedures: (Don't include imaging studies which can be auto populated. Include things that cannot be auto populated i.e. Echo, Carotid and venous dopplers, Foley, Bipap, HD, tubes/drains, wound vac, central lines etc)  Perirectal abscess debridement  Antimicrobials: (specify start and planned stop date. Auto populated tables are space occupying and do not give end dates)  Zosyn 12/1-12/4  Ancef/flagyl 12/4 - 12/7, oral keflex and flagyl from 12/7  Vancomycin oral 12/3- 12/4   Subjective: Feeling better, pain is tolerable, stool seems start to be thickened. Husband at bedside  Objective: Vitals:   03/05/17 1700 03/05/17 2043 03/06/17 0120 03/06/17 0551  BP: 117/80 120/84 104/65 106/70  Pulse: 91 96 92 92  Resp: 18 18 16 20   Temp: 97.8 F (36.6 C) 98.6 F (37 C) 97.6 F (36.4 C) 98.7 F (37.1 C)  TempSrc: Oral Oral Oral Oral  SpO2: 95% 97% 93% 90%  Weight:      Height:        Intake/Output Summary (Last 24 hours) at 03/06/2017 0853 Last data filed at 03/06/2017 0600 Gross per 24 hour  Intake 750 ml  Output 550 ml  Net 200 ml   Filed Weights   02/26/17 1708  Weight: 52.2 kg (115 lb)    Examination:  General: No acute distress. Cardiovascular: Heart sounds show a regular rate, and rhythm. No gallops or rubs. No murmurs. No JVD. Lungs: Clear to auscultation bilaterally with good air movement. No rales, rhonchi or wheezes. Abdomen: Soft, nontender, nondistended with normal active bowel sounds. No masses. No hepatosplenomegaly. Neurological: Alert and oriented 3. Moves all extremities 4 with equal strength.  Cranial nerves II through XII grossly intact. Skin: Intact dressing. Extremities: No clubbing or cyanosis. R>L 1+ LEE.  Psychiatric: Mood and affect are normal. Insight and judgment are appropriate.  Data Reviewed: I have personally reviewed following labs and imaging studies  CBC: Recent Labs  Lab 02/27/17 1821 02/27/17 2348 02/28/17 0700 03/01/17 0344 03/02/17 0346 03/03/17 0324 03/04/17 0631 03/05/17 0539 03/06/17 0537  WBC 9.5 10.0 8.6 8.4 7.6 7.5 7.7 7.7 6.3  NEUTROABS 7.4 7.1 5.2 5.6  --   --  4.9  --   --   HGB 8.6* 9.0* 8.1* 8.2* 7.2* 7.1* 8.7* 8.3* 8.1*  HCT 25.4* 26.5* 23.8* 24.3* 21.6* 21.4* 26.5* 25.0* 24.4*  MCV 90.7 91.4 91.2 92.0 93.5 93.9 93.3 93.6 93.8  PLT 173 167 177 149* 157 191 244 300 [id number] Basic Metabolic Panel: Recent Labs  Lab 03/01/17 0344 03/02/17 0346 03/03/17 0324 03/04/17 0631 03/05/17 0546 03/06/17 0537  NA 136 137 136 135 136 140  K 3.6 3.4* 3.9 4.1 4.0 3.7  CL 106 107  107 103 103 105  CO2 27 27 26 27 28 30   GLUCOSE 75 68 234* 338* 188* 163*  BUN <5* <5* 7 10 7 8   CREATININE 0.40* <0.30* 0.43* 0.42* <0.30* 0.33*  CALCIUM 7.6* 7.2* 7.5* 7.8* 7.8* 7.7*  MG 1.5* 1.9 2.0 1.8  --   --    GFR: Estimated Creatinine Clearance: 87 mL/min (A) (by C-G formula based on SCr of 0.33 mg/dL (L)). Liver Function Tests: Recent Labs  Lab 03/01/17 0344 03/05/17 0546  AST 14*  --   ALT 7*  --   ALKPHOS 106  --   BILITOT 0.4  --   PROT 6.1*  --   ALBUMIN 1.4* 1.3*   No results for input(s): LIPASE, AMYLASE in the last 168 hours. No results for input(s): AMMONIA in the last 168 hours. Coagulation Profile: No results for input(s): INR, PROTIME in the last 168 hours. Cardiac Enzymes: No results for input(s): CKTOTAL, CKMB, CKMBINDEX, TROPONINI in the last 168 hours. BNP (last 3 results) No results for input(s): PROBNP in the last 8760 hours. HbA1C: No results for input(s): HGBA1C in the last 72 hours. CBG: Recent Labs  Lab 03/05/17 0738  03/05/17 1148 03/05/17 1649 03/05/17 2047 03/06/17 0809  GLUCAP 189* 130* 160* 191* 226*   Lipid Profile: No results for input(s): CHOL, HDL, LDLCALC, TRIG, CHOLHDL, LDLDIRECT in the last 72 hours. Thyroid Function Tests: No results for input(s): TSH, T4TOTAL, FREET4, T3FREE, THYROIDAB in the last 72 hours. Anemia Panel: No results for input(s): VITAMINB12, FOLATE, FERRITIN, TIBC, IRON, RETICCTPCT in the last 72 hours. Sepsis Labs: Recent Labs  Lab 02/28/17 0100 03/01/17 0344 03/02/17 0346  PROCALCITON 0.16 <0.10 <0.10    Recent Results (from the past 240 hour(s))  Blood Culture (routine x 2)     Status: None   Collection Time: 02/26/17  4:45 PM  Result Value Ref Range Status   Specimen Description BLOOD RIGHT ANTECUBITAL  Final   Special Requests   Final    BOTTLES DRAWN AEROBIC AND ANAEROBIC Blood Culture adequate volume   Culture   Final    NO GROWTH 5 DAYS Performed at [facility], [address], [place], [place] [address]    Report Status 03/03/2017 FINAL  Final  Blood Culture (routine x 2)     Status: Abnormal   Collection Time: 02/26/17  4:55 PM  Result Value Ref Range Status   Specimen Description BLOOD LEFT ANTECUBITAL  Final   Special Requests   Final    BOTTLES DRAWN AEROBIC AND ANAEROBIC Blood Culture adequate volume   Culture  Setup Time   Final    IN BOTH AEROBIC AND ANAEROBIC BOTTLES CRITICAL RESULT CALLED TO, READ BACK BY AND VERIFIED WITH: B. GREEN PHARMD, AT 2333 02/27/17 BY D. VANHOOK CORRECTED RESULTS GRAM POSITIVE RODS PREVIOUSLY REPORTED AS: GRAM POSITIVE COCCI IN CHAINS CORRECTED RESULTS CALLED TO: PHARMD M BELL [phone] MLM    Culture (A)  Final    LACTOBACILLUS SPECIES Standardized susceptibility testing for this organism is not available. Performed at [facility], [place] [address], [place], [place] [address]    Report Status 03/02/2017 FINAL  Final  Blood Culture ID Panel (Reflexed)     Status: None   Collection Time:  02/26/17  4:55 PM  Result Value Ref Range Status   Enterococcus species NOT DETECTED NOT DETECTED Final   Listeria monocytogenes NOT DETECTED NOT DETECTED Final   Staphylococcus species NOT DETECTED NOT DETECTED Final  Staphylococcus aureus NOT DETECTED NOT DETECTED Final   Streptococcus species NOT DETECTED NOT DETECTED Final   Streptococcus agalactiae NOT DETECTED NOT DETECTED Final   Streptococcus pneumoniae NOT DETECTED NOT DETECTED Final   Streptococcus pyogenes NOT DETECTED NOT DETECTED Final   Acinetobacter baumannii NOT DETECTED NOT DETECTED Final   Enterobacteriaceae species NOT DETECTED NOT DETECTED Final   Enterobacter cloacae complex NOT DETECTED NOT DETECTED Final   Escherichia coli NOT DETECTED NOT DETECTED Final   Klebsiella oxytoca NOT DETECTED NOT DETECTED Final   Klebsiella pneumoniae NOT DETECTED NOT DETECTED Final   Proteus species NOT DETECTED NOT DETECTED Final   Serratia marcescens NOT DETECTED NOT DETECTED Final   Haemophilus influenzae NOT DETECTED NOT DETECTED Final   Neisseria meningitidis NOT DETECTED NOT DETECTED Final   Pseudomonas aeruginosa NOT DETECTED NOT DETECTED Final   Candida albicans NOT DETECTED NOT DETECTED Final   Candida glabrata NOT DETECTED NOT DETECTED Final   Candida krusei NOT DETECTED NOT DETECTED Final   Candida parapsilosis NOT DETECTED NOT DETECTED Final   Candida tropicalis NOT DETECTED NOT DETECTED Final    Comment: Performed at [facility], [place] [address], [place], [place] [address]  C difficile quick scan w PCR reflex     Status: Abnormal   Collection Time: 02/26/17  7:45 PM  Result Value Ref Range Status   C Diff antigen POSITIVE (A) NEGATIVE Final   C Diff toxin NEGATIVE NEGATIVE Final   C Diff interpretation Results are indeterminate. See PCR results.  Final  Culture, blood (x 2)     Status: None   Collection Time: 02/26/17 11:00 PM  Result Value Ref Range Status   Specimen Description BLOOD LEFT WRIST  Final    Special Requests   Final    BOTTLES DRAWN AEROBIC AND ANAEROBIC Blood Culture adequate volume   Culture   Final    NO GROWTH 5 DAYS Performed at [facility], [address], [place], [place] [address]    Report Status 03/04/2017 FINAL  Final  Urine culture     Status: None   Collection Time: 02/26/17 11:00 PM  Result Value Ref Range Status   Specimen Description URINE, RANDOM  Final   Special Requests NONE  Final   Culture   Final    NO GROWTH Performed at [facility], [place] [address] [place], [place] [address]    Report Status 02/28/2017 FINAL  Final  MRSA PCR Screening     Status: None   Collection Time: 02/26/17 11:00 PM  Result Value Ref Range Status   MRSA by PCR NEGATIVE NEGATIVE Final    Comment:        The GeneXpert MRSA Assay (FDA approved for NASAL specimens only), is one component of a comprehensive MRSA colonization surveillance program. It is not intended to diagnose MRSA infection nor to guide or monitor treatment for MRSA infections.   Culture, blood (x 2)     Status: None   Collection Time: 02/26/17 11:05 PM  Result Value Ref Range Status   Specimen Description BLOOD LEFT HAND  Final   Special Requests IN PEDIATRIC BOTTLE Blood Culture adequate volume  Final   Culture   Final    NO GROWTH 5 DAYS Performed at [facility], [place] [address] [place],  [address]    Report Status 03/04/2017 FINAL  Final  Gastrointestinal Panel by PCR , Stool     Status: None   Collection Time: 02/27/17  8:49 AM  Result Value Ref Range Status   Campylobacter species NOT DETECTED NOT DETECTED Final   Plesimonas shigelloides NOT DETECTED NOT DETECTED Final   Salmonella species NOT DETECTED NOT DETECTED Final   Yersinia enterocolitica NOT DETECTED NOT DETECTED Final   Vibrio species NOT DETECTED NOT DETECTED Final   Vibrio cholerae NOT DETECTED NOT DETECTED Final   Enteroaggregative E coli (EAEC) NOT DETECTED NOT DETECTED Final   Enteropathogenic E  coli (EPEC) NOT DETECTED NOT DETECTED Final   Enterotoxigenic E coli (ETEC) NOT DETECTED NOT DETECTED Final   Shiga like toxin producing E coli (STEC) NOT DETECTED NOT DETECTED Final   Shigella/Enteroinvasive E coli (EIEC) NOT DETECTED NOT DETECTED Final   Cryptosporidium NOT DETECTED NOT DETECTED Final   Cyclospora cayetanensis NOT DETECTED NOT DETECTED Final   Entamoeba histolytica NOT DETECTED NOT DETECTED Final   Giardia lamblia NOT DETECTED NOT DETECTED Final   Adenovirus F40/41 NOT DETECTED NOT DETECTED Final   Astrovirus NOT DETECTED NOT DETECTED Final   Norovirus GI/GII NOT DETECTED NOT DETECTED Final   Rotavirus A NOT DETECTED NOT DETECTED Final   Sapovirus (I, II, IV, and V) NOT DETECTED NOT DETECTED Final  Aerobic/Anaerobic Culture (surgical/deep wound)     Status: None (Preliminary result)   Collection Time: 03/01/17 11:00 AM  Result Value Ref Range Status   Specimen Description ABSCESS  Final   Special Requests NONE  Final   Gram Stain   Final    ABUNDANT WBC PRESENT, PREDOMINANTLY PMN FEW GRAM POSITIVE RODS RARE GRAM POSITIVE COCCI IN PAIRS RARE GRAM NEGATIVE RODS    Culture   Final    FEW KLEBSIELLA PNEUMONIAE HOLDING FOR POSSIBLE ANAEROBE Performed at [facility], [place] [address], [place], [place] [address]    Report Status PENDING  Incomplete   Organism ID, Bacteria KLEBSIELLA PNEUMONIAE  Final      Susceptibility   Klebsiella pneumoniae - MIC*    AMPICILLIN >=32 RESISTANT Resistant     CEFAZOLIN <=4 SENSITIVE Sensitive     CEFEPIME <=1 SENSITIVE Sensitive     CEFTAZIDIME <=1 SENSITIVE Sensitive     CEFTRIAXONE <=1 SENSITIVE Sensitive     CIPROFLOXACIN <=0.25 SENSITIVE Sensitive     GENTAMICIN <=1 SENSITIVE Sensitive     IMIPENEM <=0.25 SENSITIVE Sensitive     TRIMETH/SULFA <=20 SENSITIVE Sensitive     AMPICILLIN/SULBACTAM >=32 RESISTANT Resistant     PIP/TAZO 16 SENSITIVE Sensitive     Extended ESBL NEGATIVE Sensitive     * FEW KLEBSIELLA  PNEUMONIAE  Gastrointestinal Panel by PCR , Stool     Status: None   Collection Time: 03/03/17  1:44 AM  Result Value Ref Range Status   Campylobacter species NOT DETECTED NOT DETECTED Final   Plesimonas shigelloides NOT DETECTED NOT DETECTED Final   Salmonella species NOT DETECTED NOT DETECTED Final   Yersinia enterocolitica NOT DETECTED NOT DETECTED Final   Vibrio species NOT DETECTED NOT DETECTED Final   Vibrio cholerae NOT DETECTED NOT DETECTED Final   Enteroaggregative E coli (EAEC) NOT DETECTED NOT DETECTED Final   Enteropathogenic E coli (EPEC) NOT DETECTED NOT DETECTED Final   Enterotoxigenic E coli (ETEC) NOT DETECTED NOT DETECTED Final   Shiga like toxin producing E coli (STEC) NOT DETECTED NOT DETECTED Final   Shigella/Enteroinvasive E coli (EIEC) NOT DETECTED NOT DETECTED Final   Cryptosporidium NOT DETECTED NOT DETECTED Final   Cyclospora cayetanensis NOT DETECTED NOT DETECTED Final   Entamoeba histolytica NOT DETECTED NOT DETECTED  Final   Giardia lamblia NOT DETECTED NOT DETECTED Final   Adenovirus F40/41 NOT DETECTED NOT DETECTED Final   Astrovirus NOT DETECTED NOT DETECTED Final   Norovirus GI/GII NOT DETECTED NOT DETECTED Final   Rotavirus A NOT DETECTED NOT DETECTED Final   Sapovirus (I, II, IV, and V) NOT DETECTED NOT DETECTED Final  Clostridium Difficile by PCR     Status: None   Collection Time: 03/04/17  9:00 AM  Result Value Ref Range Status   Toxigenic C Difficile by pcr NEGATIVE NEGATIVE Final    Comment: Patient is colonized with non toxigenic C. difficile. May not need treatment unless significant symptoms are present. Performed at [facility], [place] [address] [place], [place] [address]          Radiology Studies: No results found.      Scheduled Meds: . citalopram  10 mg Oral Daily  . feeding supplement (ENSURE ENLIVE)  237 mL Oral TID BM  . folic acid  1 mg Oral Daily  . insulin aspart  0-9 Units Subcutaneous TID WC  . insulin  glargine  8 Units Subcutaneous Daily  . lipase/protease/amylase  36,000 Units Oral TID WC  . metroNIDAZOLE  500 mg Oral Q8H   Continuous Infusions: .  ceFAZolin (ANCEF) IV 1 g (03/06/17 0544)     LOS: 8 days    Time spent: over 20 minutes    Florencia Reasons, MD PhD [facility] Pager [phone]  If 7PM-7AM, please contact night-coverage [email or web address] Password [email or web address] 03/06/2017, 8:53 AM

## 2017-03-06 NOTE — Progress Notes (Signed)
Nutrition Follow-up  DOCUMENTATION CODES:   Underweight, Non-severe (moderate) malnutrition in context of chronic illness  INTERVENTION:   Boost Breeze po Q24, each supplement provides 250 kcal and 9 grams of protein Provide Premier Protein Q24, each supplement provides 160kcal and 30g protein.   NUTRITION DIAGNOSIS:   Moderate Malnutrition related to chronic illness(T1DM non-compliance) as evidenced by percent weight loss, moderate fat depletion, moderate muscle depletion.  Ongoing  GOAL:   Patient will meet greater than or equal to 90% of their needs  No meeting  MONITOR:   PO intake, Supplement acceptance, Weight trends, Labs  REASON FOR ASSESSMENT:   Other (Comment)(Low BMI)    ASSESSMENT:   Pt with PMH significant for type 1 DM, HTN, and gastroparesis. Presents this admission with altered mental status. Admitted for DKA.    Pt's uncontrolled watery stools continue. Rectal tube removed 12/5 and placed back 12/6.  Pt reports Ensure was making her watery stools worse and has not consumed any in two days.  Discussed the importance of supplementation for conservation of lean body mass, especially in her malnourished condition.  Will trial premier protein and boost breeze for tolerance. Per pt's request.  Nursing concerned about recurring elevated blood sugars that are running from 171-255 with one reading of 388. Discussed with nursing that pt is on sliding scale insulin and the high calorie supplements needed for her malnourished state often contain more carbohydrates, but are necessary for weight gain. RD to continue monitoring blood sugars closely.  No recent weights have been obtained since last RD visit. Would recommend checking weekly weights to monitor trends as pt is malnourished.   Medications reviewed and include: folic acid, SSI, insulin, creon, IV abx Labs reviewed: CBG 171-255   Diet Order:  Diet Carb Modified Fluid consistency: Thin; Room service  appropriate? Yes  EDUCATION NEEDS:   Education needs have been addressed  Skin:  Skin Assessment: Skin Integrity Issues: Skin Integrity Issues:: Incisions Incisions: perineum  Last BM:  02/28/17  Height:   Ht Readings from Last 1 Encounters:  02/26/17 5\' 7"  (1.702 m)    Weight:   Wt Readings from Last 1 Encounters:  02/26/17 115 lb (52.2 kg)    Ideal Body Weight:  61.4 kg  BMI:  Body mass index is 18.01 kg/m.  Estimated Nutritional Needs:   Kcal:  1600-1800 kcal/day  Protein:  80-90 g/day  Fluid:  >1.6 L/day    Vanessa Kick RD, LDN Clinical Nutrition Pager # - [phone]

## 2017-03-06 NOTE — Progress Notes (Signed)
5 Days Post-Op    CC: Rectal pain  Subjective: Patient still tearful and has a very uncomfortable open site perirectal abscess.  It is clean and no further debridement needed.  Objective: Vital signs in last 24 hours: Temp:  [97.6 F (36.4 C)-98.7 F (37.1 C)] 98.7 F (37.1 C) (12/07 0551) Pulse Rate:  [91-96] 92 (12/07 0551) Resp:  [16-20] 20 (12/07 0551) BP: (104-123)/(65-87) 106/70 (12/07 0551) SpO2:  [90 %-97 %] 90 % (12/07 0551) Last BM Date: 03/05/17  Intake/Output from previous day: 12/06 0701 - 12/07 0700 In: 990 [P.O.:840; IV Piggyback:150] Out: 550 [Urine:450; Stool:100] Intake/Output this shift: No intake/output data recorded.  General appearance: alert, cooperative and no distress Skin: Open site is clean and pink.  It is frequently soiled with diarrhea and a Flexi-Seal is in place at this time.  No further debridement is necessary.  Lab Results:  Recent Labs    03/05/17 0539 03/06/17 0537  WBC 7.7 6.3  HGB 8.3* 8.1*  HCT 25.0* 24.4*  PLT 300 317    BMET Recent Labs    03/05/17 0546 03/06/17 0537  NA 136 140  K 4.0 3.7  CL 103 105  CO2 28 30  GLUCOSE 188* 163*  BUN 7 8  CREATININE <0.30* 0.33*  CALCIUM 7.8* 7.7*   PT/INR No results for input(s): LABPROT, INR in the last 72 hours.  Recent Labs  Lab 03/01/17 0344 03/05/17 0546  AST 14*  --   ALT 7*  --   ALKPHOS 106  --   BILITOT 0.4  --   PROT 6.1*  --   ALBUMIN 1.4* 1.3*     Lipase     Component Value Date/Time   LIPASE 53 (H) 05/13/2016 0026     Medications: . citalopram  10 mg Oral Daily  . feeding supplement (ENSURE ENLIVE)  237 mL Oral TID BM  . folic acid  1 mg Oral Daily  . insulin aspart  0-9 Units Subcutaneous TID WC  . insulin glargine  8 Units Subcutaneous Daily  . lipase/protease/amylase  36,000 Units Oral TID WC  . metroNIDAZOLE  500 mg Oral Q8H   Anti-infectives (From admission, onward)   Start     Dose/Rate Route Frequency Ordered Stop   03/03/17 2200   ceFAZolin (ANCEF) IVPB 1 g/50 mL premix     1 g 100 mL/hr over 30 Minutes Intravenous Every 8 hours 03/03/17 1742     03/03/17 2200  metroNIDAZOLE (FLAGYL) tablet 500 mg     500 mg Oral Every 8 hours 03/03/17 1742     03/02/17 1800  vancomycin (VANCOCIN) 50 mg/mL oral solution 125 mg  Status:  Discontinued     125 mg Oral Every 6 hours 03/02/17 1521 03/05/17 1341   02/28/17 1300  piperacillin-tazobactam (ZOSYN) IVPB 3.375 g  Status:  Discontinued     3.375 g 12.5 mL/hr over 240 Minutes Intravenous Every 8 hours 02/28/17 1227 03/03/17 1741     Assessment/Plan 1. Perirectal abscess On Zosyn Looks like there is not any need for additional drainage. Continue dressing changes.  2. Diabetes - poorly managed She was seeing Dr. Buddy Duty - but looking for another endocrinologist because of finances                        Glucose 338 this AM 3. Chronic diarrhea and weight loss Etiology unclear 4. Anxiety 5. Anemia Hgb - 8.7 today 6. Malnourished Albumin - 1.4 - 03/01/2017 7.  Ascites/pleural effusion - seen on CT scan Possibly based on poorly controlled diabetes and malnutrition  Principal Problem: MDD (major depressive disorder), recurrent episode, mild (HCC) Active Problems: Diabetes type 1, uncontrolled (HCC) Protein-calorie malnutrition, severe (HCC) DKA (diabetic ketoacidosis) (HCC) Diarrhea Lower GI bleed FEN: Carb mod ID:  Flagyl/Ancef/ oral Vancomycin - C diff negative DVT:  Nothing list Follow up: Dr. Alphonsa Overall   Plan:  Open site looks fine, Flexiseal is back in.  Continue local wound care to open rectal site.  Showers and/or sitz baths to clean the site and then redress if possible.  Follow-up with Dr. Lucia Gaskins 2 weeks after discharge.     LOS: 8 days    [name] 03/06/2017 [phone]

## 2017-03-06 NOTE — Progress Notes (Signed)
Subjective: Stool continues to thicken, but she still has frequent bowel movements.  No hematochezia or melena.  Objective: Vital signs in last 24 hours: Temp:  [97.6 F (36.4 C)-98.7 F (37.1 C)] 98.7 F (37.1 C) (12/07 0551) Pulse Rate:  [91-96] 92 (12/07 0551) Resp:  [16-20] 20 (12/07 0551) BP: (104-123)/(65-87) 106/70 (12/07 0551) SpO2:  [90 %-97 %] 90 % (12/07 0551) Last BM Date: 03/06/17  Intake/Output from previous day: 12/06 0701 - 12/07 0700 In: 990 [P.O.:840; IV Piggyback:150] Out: 550 [Urine:450; Stool:100] Intake/Output this shift: No intake/output data recorded.  General appearance: alert and no distress GI: soft, non-tender; bowel sounds normal; no masses,  no organomegaly  Lab Results: Recent Labs    03/04/17 0631 03/05/17 0539 03/06/17 0537  WBC 7.7 7.7 6.3  HGB 8.7* 8.3* 8.1*  HCT 26.5* 25.0* 24.4*  PLT 244 300 317   BMET Recent Labs    03/04/17 0631 03/05/17 0546 03/06/17 0537  NA 135 136 140  K 4.1 4.0 3.7  CL 103 103 105  CO2 27 28 30   GLUCOSE 338* 188* 163*  BUN 10 7 8   CREATININE 0.42* <0.30* 0.33*  CALCIUM 7.8* 7.8* 7.7*   LFT Recent Labs    03/05/17 0546  ALBUMIN 1.3*   PT/INR No results for input(s): LABPROT, INR in the last 72 hours. Hepatitis Panel No results for input(s): HEPBSAG, HCVAB, HEPAIGM, HEPBIGM in the last 72 hours. C-Diff No results for input(s): CDIFFTOX in the last 72 hours. Fecal Lactopherrin No results for input(s): FECLLACTOFRN in the last 72 hours.  Studies/Results: No results found.  Medications:  Scheduled: . cephALEXin  500 mg Oral Q6H  . citalopram  10 mg Oral Daily  . feeding supplement  1 Container Oral Q24H  . folic acid  1 mg Oral Daily  . insulin aspart  0-9 Units Subcutaneous TID WC  . insulin glargine  8 Units Subcutaneous Daily  . lipase/protease/amylase  36,000 Units Oral TID WC  . metroNIDAZOLE  500 mg Oral Q8H  . protein supplement shake  11 oz Oral Q24H    Continuous:   Assessment/Plan: 1) Diarrhea - continues to improve. 2) Perirectal abscess - healing. 3) DM - still very difficult to control. 4) Anemia. 5) Heme positive stool.     Her diarrhea is improving with Creon.  WBC is normal and vital signs are stable.  No evidence of any fever.  At this point I think she can be started on scheduled Lomotil.  She still has frequent bowel movements, but the stool is thickening.  The patient is heme positive and she is anemic, but the heme positivity is difficult to interpret with the perirectal abscess.  It may be a false positive.  In the past her HGB has fluctuated, but it did average in the 10-12 range.  Prepping her for a colonoscopy or FFS will be very difficult with her healing perirectal abscess as she is very uncomfortable with this issue.  If necessary, an unsedated FFS can be pursued if she does not improve any further with her diarrhea or if her clinical status changes.    Plan: 1) Continue with Creon. 2) Add scheduled Lomotil. 3) Tight blood sugar control. 4) ? FFS pending her clinical progress.  LOS: 8 days   Socrates Cahoon D 03/06/2017, 12:52 PM

## 2017-03-06 NOTE — Discharge Instructions (Signed)
[facility] Endocrinology Practices:  [facility]: [phone] Dr. Cruzita Lederer [facility]: [phone] Dr. Estil [facility] [place]: [phone]  Mechanical Wound Debridement Mechanical wound debridement is a treatment to remove dead tissue from a wound. This helps the wound heal. The treatment involves cleaning the wound (irrigation) and using a pad or gauze (dressing) to remove dead tissue and debris from the wound. There are different types of mechanical wound debridement. Depending on the wound, you may need to repeat this procedure or change to another form of debridement as your wound starts to heal. Tell a health care provider about:  Any allergies you have.  All medicines you are taking, including vitamins, herbs, eye drops, creams, and over-the-counter medicines.  Any blood disorders you have.  Any medical conditions you have, including any conditions that: ? Cause a significant decrease in blood circulation to the part of the body where the wound is, such as peripheral vascular disease. ? Compromise your defense (immune) system or white blood count.  Any surgeries you have had.  Whether you are pregnant or may be pregnant. What are the risks? Generally, this is a safe procedure. However, problems may occur, including:  Infection.  Bleeding.  Damage to healthy tissue in and around your wound.  Soreness or pain.  Failure of the wound to heal.  Scarring.  What happens before the procedure? You may be given antibiotic medicine to help prevent infection. What happens during the procedure?  Your health care provider may apply a numbing medicine (topical anesthetic) to the wound.  Your health care provider will irrigate your wound with a germ-free (sterile), salt-water (saline) solution. This removes debris, bacteria, and dead tissue.  Depending on what type of mechanical wound debridement you are having,  your health care provider may do one of the following: ? Put a dressing on your wound. You may have dry gauze pad placed into the wound. Your health care provider will remove the gauze after the wound is dry. Any dead tissue and debris that has dried into the gauze will be lifted out of the wound (wet-to-dry debridement). ? Use a type of pad (monofilament fiber debridement pad). This pad has a fluffy surface on one side that picks up dead tissue and debris from your wound. Your health care provider wets the pad and wipes it over your wound for several minutes. ? Irrigate your wound with a pressurized stream of solution such as saline or water.  Once your health care provider is finished, he or she may apply a light dressing to your wound. The procedure may vary among health care providers and hospitals. What happens after the procedure?  You may receive medicine for pain.  You will continue to receive antibiotic medicine if it was started before your procedure. This information is not intended to replace advice given to you by your health care provider. Make sure you discuss any questions you have with your health care provider. Document Released: 12/06/2014 Document Revised: 08/23/2015 Document Reviewed: 07/26/2014 Elsevier Interactive Patient Education  2018 [employer].    How to Take a [employer] can use a Sitz or shower with water directed into open site. A sitz bath is a warm water bath that is taken while you are sitting down. The water should only come up to your hips and should cover your buttocks. Your health care provider may recommend a sitz bath to help you:  Clean the lower part of your body,  including your genital area.  With itching.  With pain.  With sore muscles or muscles that tighten or spasm.  How to take a sitz bath Take 3-4 sitz baths per day or as told by your health care provider. 1. Partially fill a bathtub with warm water. You will only need the water  to be deep enough to cover your hips and buttocks when you are sitting in it. 2. If your health care provider told you to put medicine in the water, follow the directions exactly. 3. Sit in the water and open the tub drain a little. 4. Turn on the warm water again to keep the tub at the correct level. Keep the water running constantly. 5. Soak in the water for 15-20 minutes or as told by your health care provider. 6. After the sitz bath, pat the affected area dry first. Do not rub it. 7. Be careful when you stand up after the sitz bath because you may feel dizzy.  Contact a health care provider if:  Your symptoms get worse. Do not continue with sitz baths if your symptoms get worse.  You have new symptoms. Do not continue with sitz baths until you talk with your health care provider. This information is not intended to replace advice given to you by your health care provider. Make sure you discuss any questions you have with your health care provider. Document Released: 12/08/2003 Document Revised: 08/15/2015 Document Reviewed: 03/15/2014 Elsevier Interactive Patient Education  [employer].

## 2017-03-07 LAB — GLUCOSE, CAPILLARY
Glucose-Capillary: 146 mg/dL — ABNORMAL HIGH (ref 65–99)
Glucose-Capillary: 112 mg/dL — ABNORMAL HIGH (ref 65–99)
Glucose-Capillary: 115 mg/dL — ABNORMAL HIGH (ref 65–99)

## 2017-03-07 LAB — BASIC METABOLIC PANEL
Anion gap: 5 (ref 5–15)
BUN: 9 mg/dL (ref 6–20)
CO2: 30 mmol/L (ref 22–32)
Calcium: 7.7 mg/dL — ABNORMAL LOW (ref 8.9–10.3)
Chloride: 104 mmol/L (ref 101–111)
Creatinine, Ser: 0.37 mg/dL — ABNORMAL LOW (ref 0.44–1.00)
GFR calc Af Amer: >60 mL/min (ref >60)
GFR calc non Af Amer: >60 mL/min (ref >60)
Glucose, Bld: 169 mg/dL — ABNORMAL HIGH (ref 65–99)
Potassium: 3.9 mmol/L (ref 3.5–5.1)
Sodium: 139 mmol/L (ref 135–145)

## 2017-03-07 LAB — MAGNESIUM: Magnesium: 1.6 mg/dL — ABNORMAL LOW (ref 1.7–2.4)

## 2017-03-07 LAB — AEROBIC/ANAEROBIC CULTURE (SURGICAL/DEEP WOUND)

## 2017-03-07 LAB — AEROBIC/ANAEROBIC CULTURE W GRAM STAIN (SURGICAL/DEEP WOUND)

## 2017-03-07 MED ORDER — GABAPENTIN 300 MG PO CAPS
300.0000 mg | ORAL_CAPSULE | Freq: Two times a day (BID) | ORAL | Status: DC
  Administered 2017-03-07 – 2017-03-14 (×13): 300 mg via ORAL
  Filled 2017-03-07 (×13): qty 1

## 2017-03-07 MED ORDER — OXYCODONE HCL 5 MG PO TABS: 5.0000 mg | ORAL_TABLET | ORAL | Status: DC | PRN

## 2017-03-07 MED ORDER — LOPERAMIDE HCL 2 MG PO CAPS
2.0000 mg | ORAL_CAPSULE | Freq: Three times a day (TID) | ORAL | Status: DC
  Administered 2017-03-07 – 2017-03-14 (×18): 2 mg via ORAL
  Filled 2017-03-07 (×18): qty 1

## 2017-03-07 MED ORDER — GLUCERNA SHAKE PO LIQD
237.0000 mL | Freq: Two times a day (BID) | ORAL | Status: DC
  Administered 2017-03-07 – 2017-03-13 (×4): 237 mL via ORAL
  Filled 2017-03-07 (×16): qty 237

## 2017-03-07 MED ORDER — TRAMADOL HCL 50 MG PO TABS
50.0000 mg | ORAL_TABLET | Freq: Four times a day (QID) | ORAL | Status: DC | PRN
  Administered 2017-03-07 – 2017-03-10 (×7): 50 mg via ORAL
  Filled 2017-03-07 (×6): qty 1

## 2017-03-07 MED ORDER — DIPHENOXYLATE-ATROPINE 2.5-0.025 MG PO TABS
2.0000 | ORAL_TABLET | Freq: Four times a day (QID) | ORAL | Status: DC
  Administered 2017-03-07 – 2017-03-14 (×25): 2 via ORAL
  Filled 2017-03-07 (×25): qty 2

## 2017-03-07 MED ORDER — ACETAMINOPHEN 500 MG PO TABS
1000.0000 mg | ORAL_TABLET | Freq: Three times a day (TID) | ORAL | Status: DC
  Administered 2017-03-07 – 2017-03-14 (×21): 1000 mg via ORAL
  Filled 2017-03-07 (×21): qty 2

## 2017-03-07 MED ORDER — FERROUS SULFATE 325 (65 FE) MG PO TABS
325.0000 mg | ORAL_TABLET | Freq: Two times a day (BID) | ORAL | Status: DC
  Administered 2017-03-07 – 2017-03-14 (×14): 325 mg via ORAL
  Filled 2017-03-07 (×14): qty 1

## 2017-03-07 MED ORDER — OXYCODONE HCL 5 MG PO TABS
5.0000 mg | ORAL_TABLET | ORAL | Status: DC | PRN
Start: 2017-03-07 — End: 2017-03-14
  Administered 2017-03-08 – 2017-03-14 (×20): 10 mg via ORAL
  Filled 2017-03-07 (×21): qty 2

## 2017-03-07 MED ORDER — INSULIN ASPART 100 UNIT/ML ~~LOC~~ SOLN
2.0000 [IU] | Freq: Three times a day (TID) | SUBCUTANEOUS | Status: DC
  Administered 2017-03-07 – 2017-03-14 (×15): 2 [IU] via SUBCUTANEOUS

## 2017-03-07 MED ORDER — MAGNESIUM SULFATE 4 GM/100ML IV SOLN
4.0000 g | Freq: Once | INTRAVENOUS | Status: AC
Start: 2017-03-07 — End: 2017-03-07
  Administered 2017-03-07: 4 g via INTRAVENOUS
  Filled 2017-03-07: qty 100

## 2017-03-07 MED ORDER — METHOCARBAMOL 500 MG PO TABS
1000.0000 mg | ORAL_TABLET | Freq: Four times a day (QID) | ORAL | Status: DC | PRN
  Administered 2017-03-14: 1000 mg via ORAL
  Filled 2017-03-07: qty 2

## 2017-03-07 MED ORDER — METHOCARBAMOL 1000 MG/10ML IJ SOLN
1000.0000 mg | Freq: Four times a day (QID) | INTRAVENOUS | Status: DC | PRN
  Filled 2017-03-07: qty 10

## 2017-03-07 NOTE — Progress Notes (Signed)
PROGRESS NOTE    [name]  SPQ:[id number] DOB: 02-Apr-1990 DOA: 02/26/2017 PCP: Kathryn Blade, MD    Brief Narrative: [name] is [name] 26 y.o. female with medical history significant of type 1 DM with h/o remote DKA; HTN; and gastroparesis presenting with AMS.  Today, her husband went to work early this AM.  They work at the same facility and she was due to come in later.  She called him about 11:15 and asked him to come home because she was "having [name] panic attack."  She specifically wanted him to help her.  She finally took [name] nap for [name] couple of hours.  She soiled herself and he decided to take her to the doctor.  She was somewhat incoherent with garbled speech and hallucinations.  For example, she would try to say the word for bathroom and would forget the word, that kind of thing.  She soiled herself again.  He thought it might be [name] low blood sugar and instead it was high and her temperature was low.  She has been having diarrhea since last night - awoke overnight to go to the bathroom several times.  She does have soreness and rash on her buttocks.  She also complained of [name] boil on her bottom last week; he thought it was better but it was noticeable today.   She has had DKA, but the last time was [name] few years ago.  No sick contacts.  They work for [employer] and she has been complaining of being extra tired but they thought it was due to working [name] lot of overtime.  She did eat/drink normally yesterday.  She drank some water when her husband got home today.  She complained of n/v to him but he did not witness this.  Assessment & Plan:   Principal Problem:   MDD (major depressive disorder), recurrent episode, mild (HCC) Active Problems:   Diabetes type 1, uncontrolled (HCC)   Protein-calorie malnutrition, severe ([name])   DKA (diabetic ketoacidosis) (HCC)   Diarrhea   Lower GI bleed   DKA with metabolic encephalopathy and  severe acidosis venous blood gas pH 6.8 on presentation:  -  Resolved - Pt with elevated BG's, will restart lantus, continue SSI (uptitrate as appropriate, she's on lantus 15 units BID and 8 units lispro TID with SSI  Poorly controlled type 1 diabetes -H/o Medication noncompliance -Patient reports not able to afford insulin, she reports one half brand insulin make her feel bloated. -a1c 12.2 -Diabetes educator and case manager consulted. - Fasting BG this morning ok, but elevated during the day yesterday, will start low dose meal time  Hypoglycemia event   ? due to inconsistent oral intake (noted 12/3)  Continue adjust insulin dose CTM  periRectal abscess  -CT imaging with complex fluids collection -s/p debridement in OR on 12/2,  - surgical culture + Klebsiella (holding for anaerobes),  zosyn d/ced, changed to ancef, with flagyl to cover anaerobes, Change to oral abx , not that patient is improving, plan to treat for total of 14days. Last dose of abx on 12/14. -will follow general surgery recommendations  1/4 Blood cx with lactobacillus:   Suspect this maybe contaminant, but will need to review chart.   Acute on chronic anemia -Hemoglobin 6.4 on admission, status post PRBC transfusionx1 on November 30, prbc x1 on 12/4 -B12 unremarkable, folate slightly low, Start folic acid supplement -transfuse prn to keep hgb >7. -FOBT+, Case discussed with GI Dr [name]  Chronic diarrhea//weight  loss with anemia/malnutrition -Report followed by GI Dr. Collene [name] -Ct ab /pel + abscess and ascites but no bowel wall thickening -GI ordered c diff which showed antigen positive, toxin negative. GI stopped oral vanc today given posible improvement with creon.   - appreciate GI recs, started creon 12/5.  Started lomotil 12/7.  May consider FFS if no improvement.   - -replace rectal tube   Moisture associated skin damage around anal area  Likely from chronic diarrhea Wound care consulted  Hypokalemia /hypomagnesemia Replete k/mag  Malnutrition. moderate: CT  ab/pel" Moderate volume ascites with diffuse soft tissue and body wall edema." Albumin 1.4 Nutrition consulted  Bilateral lower extremity edema: likely third spacing Venous doppler no DVT. D/c ivf Nutrition supplement.  Anxiety/ depression: Psych input appreciated, recommend celexa 52m daily for [name] few days, increase to 219mdaily if able to tolerate.   DVT prophylaxis: SCD Code Status: full  Family Communication: husband at bedside Disposition Plan: pending improvement in pain and diarrhea   Consultants:   Surgery and GI  Procedures: (Don't include imaging studies which can be auto populated. Include things that cannot be auto populated i.e. Echo, Carotid and venous dopplers, Foley, Bipap, HD, tubes/drains, wound vac, central lines etc)  Perirectal abscess debridement  Antimicrobials: (specify start and planned stop date. Auto populated tables are space occupying and do not give end dates)  Zosyn 12/1-12/4  Ancef/flagyl 12/4 - 12/7, oral keflex and flagyl from 12/7  Vancomycin oral 12/3- 12/4   Subjective: Still uncomfortable with frequent stools.  Objective: Vitals:   03/06/17 1419 03/06/17 2043 03/07/17 0013 03/07/17 0531  BP: 117/71 119/69 (!) 112/57 101/60  Pulse: 96 95 86 91  Resp: 19 18 18 17   Temp: 97.6 F (36.4 C) 98.2 F (36.8 C) 98.2 F (36.8 C) 98.2 F (36.8 C)  TempSrc: Oral Oral Oral Oral  SpO2: 94% 93% 95% 93%  Weight:      Height:        Intake/Output Summary (Last 24 hours) at 03/07/2017 0950 Last data filed at 03/07/2017 0853 Gross per 24 hour  Intake -  Output 1900 ml  Net -1900 ml   Filed Weights   02/26/17 1708  Weight: 52.2 kg (115 lb)    Examination:  General: No acute distress. Cardiovascular: Heart sounds show Dim Meisinger regular rate, and rhythm. No gallops or rubs. No murmurs. No JVD. Lungs: Clear to auscultation bilaterally with good air movement. No rales, rhonchi or wheezes. Abdomen: Soft, nontender, nondistended with normal  active bowel sounds. No masses. No hepatosplenomegaly. Neurological: Alert and oriented 3. Moves all extremities 4 with equal strength. Cranial nerves II through XII grossly intact. Skin: intact dressing Extremities: No clubbing or cyanosis. Bilateral LEE Psychiatric: Mood and affect are normal. Insight and judgment are appropriate.   Data Reviewed: I have personally reviewed following labs and imaging studies  CBC: Recent Labs  Lab 03/01/17 0344 03/02/17 0346 03/03/17 0324 03/04/17 0631 03/05/17 0539 03/06/17 0537  WBC 8.4 7.6 7.5 7.7 7.7 6.3  NEUTROABS 5.6  --   --  4.9  --   --   HGB 8.2* 7.2* 7.1* 8.7* 8.3* 8.1*  HCT 24.3* 21.6* 21.4* 26.5* 25.0* 24.4*  MCV 92.0 93.5 93.9 93.3 93.6 93.8  PLT 149* 157 191 244 300 [id number] Basic Metabolic Panel: Recent Labs  Lab 03/01/17 0344 03/02/17 0346 03/03/17 0324 03/04/17 0631 03/05/17 0546 03/06/17 0537 03/07/17 0613  NA 136 137 136 135 136 140 139  K 3.6 3.4* 3.9  4.1 4.0 3.7 3.9  CL 106 107 107 103 103 105 104  CO2 27 27 26 27 28 30 30   GLUCOSE 75 68 234* 338* 188* 163* 169*  BUN <5* <5* 7 10 7 8 9   CREATININE 0.40* <0.30* 0.43* 0.42* <0.30* 0.33* 0.37*  CALCIUM 7.6* 7.2* 7.5* 7.8* 7.8* 7.7* 7.7*  MG 1.5* 1.9 2.0 1.8  --   --  1.6*   GFR: Estimated Creatinine Clearance: 87 mL/min (Kwesi Sangha) (by C-G formula based on SCr of 0.37 mg/dL (L)). Liver Function Tests: Recent Labs  Lab 03/01/17 0344 03/05/17 0546  AST 14*  --   ALT 7*  --   ALKPHOS 106  --   BILITOT 0.4  --   PROT 6.1*  --   ALBUMIN 1.4* 1.3*   No results for input(s): LIPASE, AMYLASE in the last 168 hours. No results for input(s): AMMONIA in the last 168 hours. Coagulation Profile: No results for input(s): INR, PROTIME in the last 168 hours. Cardiac Enzymes: No results for input(s): CKTOTAL, CKMB, CKMBINDEX, TROPONINI in the last 168 hours. BNP (last 3 results) No results for input(s): PROBNP in the last 8760 hours. HbA1C: No results for input(s): HGBA1C  in the last 72 hours. CBG: Recent Labs  Lab 03/05/17 2047 03/06/17 0809 03/06/17 1143 03/06/17 1719 03/06/17 2041  GLUCAP 191* 226* 213* 246* 224*   Lipid Profile: No results for input(s): CHOL, HDL, LDLCALC, TRIG, CHOLHDL, LDLDIRECT in the last 72 hours. Thyroid Function Tests: No results for input(s): TSH, T4TOTAL, FREET4, T3FREE, THYROIDAB in the last 72 hours. Anemia Panel: No results for input(s): VITAMINB12, FOLATE, FERRITIN, TIBC, IRON, RETICCTPCT in the last 72 hours. Sepsis Labs: Recent Labs  Lab 03/01/17 0344 03/02/17 0346  PROCALCITON <0.10 <0.10    Recent Results (from the past 240 hour(s))  Blood Culture (routine x 2)     Status: None   Collection Time: 02/26/17  4:45 PM  Result Value Ref Range Status   Specimen Description BLOOD RIGHT ANTECUBITAL  Final   Special Requests   Final    BOTTLES DRAWN AEROBIC AND ANAEROBIC Blood Culture adequate volume   Culture   Final    NO GROWTH 5 DAYS Performed at [facility], [address], [place], [place] [address]    Report Status 03/03/2017 FINAL  Final  Blood Culture (routine x 2)     Status: Abnormal   Collection Time: 02/26/17  4:55 PM  Result Value Ref Range Status   Specimen Description BLOOD LEFT ANTECUBITAL  Final   Special Requests   Final    BOTTLES DRAWN AEROBIC AND ANAEROBIC Blood Culture adequate volume   Culture  Setup Time   Final    IN BOTH AEROBIC AND ANAEROBIC BOTTLES CRITICAL RESULT CALLED TO, READ BACK BY AND VERIFIED WITH: B. GREEN PHARMD, AT 2333 02/27/17 BY D. VANHOOK CORRECTED RESULTS GRAM POSITIVE RODS PREVIOUSLY REPORTED AS: GRAM POSITIVE COCCI IN CHAINS CORRECTED RESULTS CALLED TO: PHARMD M BELL [id number] MLM    Culture (Solan Vosler)  Final    LACTOBACILLUS SPECIES Standardized susceptibility testing for this organism is not available. Performed at [facility], [place] [address], [place], [place] [address]    Report Status 03/02/2017 FINAL  Final  Blood Culture ID Panel  (Reflexed)     Status: None   Collection Time: 02/26/17  4:55 PM  Result Value Ref Range Status   Enterococcus species NOT DETECTED NOT DETECTED Final   Listeria monocytogenes NOT DETECTED NOT  DETECTED Final   Staphylococcus species NOT DETECTED NOT DETECTED Final   Staphylococcus aureus NOT DETECTED NOT DETECTED Final   Streptococcus species NOT DETECTED NOT DETECTED Final   Streptococcus agalactiae NOT DETECTED NOT DETECTED Final   Streptococcus pneumoniae NOT DETECTED NOT DETECTED Final   Streptococcus pyogenes NOT DETECTED NOT DETECTED Final   Acinetobacter baumannii NOT DETECTED NOT DETECTED Final   Enterobacteriaceae species NOT DETECTED NOT DETECTED Final   Enterobacter cloacae complex NOT DETECTED NOT DETECTED Final   Escherichia coli NOT DETECTED NOT DETECTED Final   Klebsiella oxytoca NOT DETECTED NOT DETECTED Final   Klebsiella pneumoniae NOT DETECTED NOT DETECTED Final   Proteus species NOT DETECTED NOT DETECTED Final   Serratia marcescens NOT DETECTED NOT DETECTED Final   Haemophilus influenzae NOT DETECTED NOT DETECTED Final   Neisseria meningitidis NOT DETECTED NOT DETECTED Final   Pseudomonas aeruginosa NOT DETECTED NOT DETECTED Final   Candida albicans NOT DETECTED NOT DETECTED Final   Candida glabrata NOT DETECTED NOT DETECTED Final   Candida krusei NOT DETECTED NOT DETECTED Final   Candida parapsilosis NOT DETECTED NOT DETECTED Final   Candida tropicalis NOT DETECTED NOT DETECTED Final    Comment: Performed at [facility], [place] [address] [place], [place] [address]  C difficile quick scan w PCR reflex     Status: Abnormal   Collection Time: 02/26/17  7:45 PM  Result Value Ref Range Status   C Diff antigen POSITIVE (Nickalous Stingley) NEGATIVE Final   C Diff toxin NEGATIVE NEGATIVE Final   C Diff interpretation Results are indeterminate. See PCR results.  Final  Culture, blood (x 2)     Status: None   Collection Time: 02/26/17 11:00 PM  Result Value Ref Range Status    Specimen Description BLOOD LEFT WRIST  Final   Special Requests   Final    BOTTLES DRAWN AEROBIC AND ANAEROBIC Blood Culture adequate volume   Culture   Final    NO GROWTH 5 DAYS Performed at [facility], [address] [place], [place] [address]    Report Status 03/04/2017 FINAL  Final  Urine culture     Status: None   Collection Time: 02/26/17 11:00 PM  Result Value Ref Range Status   Specimen Description URINE, RANDOM  Final   Special Requests NONE  Final   Culture   Final    NO GROWTH Performed at [facility], [place] [address] [place], [place] [address]    Report Status 02/28/2017 FINAL  Final  MRSA PCR Screening     Status: None   Collection Time: 02/26/17 11:00 PM  Result Value Ref Range Status   MRSA by PCR NEGATIVE NEGATIVE Final    Comment:        The GeneXpert MRSA Assay (FDA approved for NASAL specimens only), is one component of Venera Privott comprehensive MRSA colonization surveillance program. It is not intended to diagnose MRSA infection nor to guide or monitor treatment for MRSA infections.   Culture, blood (x 2)     Status: None   Collection Time: 02/26/17 11:05 PM  Result Value Ref Range Status   Specimen Description BLOOD LEFT HAND  Final   Special Requests IN PEDIATRIC BOTTLE Blood Culture adequate volume  Final   Culture   Final    NO GROWTH 5 DAYS Performed at [facility], [place] [address], [place], [place] [address]    Report Status 03/04/2017 FINAL  Final  Gastrointestinal Panel by PCR ,  Stool     Status: None   Collection Time: 02/27/17  8:49 AM  Result Value Ref Range Status   Campylobacter species NOT DETECTED NOT DETECTED Final   Plesimonas shigelloides NOT DETECTED NOT DETECTED Final   Salmonella species NOT DETECTED NOT DETECTED Final   Yersinia enterocolitica NOT DETECTED NOT DETECTED Final   Vibrio species NOT DETECTED NOT DETECTED Final   Vibrio cholerae NOT DETECTED NOT DETECTED Final   Enteroaggregative E coli (EAEC) NOT  DETECTED NOT DETECTED Final   Enteropathogenic E coli (EPEC) NOT DETECTED NOT DETECTED Final   Enterotoxigenic E coli (ETEC) NOT DETECTED NOT DETECTED Final   Shiga like toxin producing E coli (STEC) NOT DETECTED NOT DETECTED Final   Shigella/Enteroinvasive E coli (EIEC) NOT DETECTED NOT DETECTED Final   Cryptosporidium NOT DETECTED NOT DETECTED Final   Cyclospora cayetanensis NOT DETECTED NOT DETECTED Final   Entamoeba histolytica NOT DETECTED NOT DETECTED Final   Giardia lamblia NOT DETECTED NOT DETECTED Final   Adenovirus F40/41 NOT DETECTED NOT DETECTED Final   Astrovirus NOT DETECTED NOT DETECTED Final   Norovirus GI/GII NOT DETECTED NOT DETECTED Final   Rotavirus Galo Sayed NOT DETECTED NOT DETECTED Final   Sapovirus (I, II, IV, and V) NOT DETECTED NOT DETECTED Final  Aerobic/Anaerobic Culture (surgical/deep wound)     Status: None (Preliminary result)   Collection Time: 03/01/17 11:00 AM  Result Value Ref Range Status   Specimen Description ABSCESS  Final   Special Requests NONE  Final   Gram Stain   Final    ABUNDANT WBC PRESENT, PREDOMINANTLY PMN FEW GRAM POSITIVE RODS RARE GRAM POSITIVE COCCI IN PAIRS RARE GRAM NEGATIVE RODS    Culture   Final    FEW KLEBSIELLA PNEUMONIAE MODERATE BACTEROIDES FRAGILIS BETA LACTAMASE POSITIVE Performed at [facility], [address] [place], [place] [address]    Report Status PENDING  Incomplete   Organism ID, Bacteria KLEBSIELLA PNEUMONIAE  Final      Susceptibility   Klebsiella pneumoniae - MIC*    AMPICILLIN >=32 RESISTANT Resistant     CEFAZOLIN <=4 SENSITIVE Sensitive     CEFEPIME <=1 SENSITIVE Sensitive     CEFTAZIDIME <=1 SENSITIVE Sensitive     CEFTRIAXONE <=1 SENSITIVE Sensitive     CIPROFLOXACIN <=0.25 SENSITIVE Sensitive     GENTAMICIN <=1 SENSITIVE Sensitive     IMIPENEM <=0.25 SENSITIVE Sensitive     TRIMETH/SULFA <=20 SENSITIVE Sensitive     AMPICILLIN/SULBACTAM >=32 RESISTANT Resistant     PIP/TAZO 16 SENSITIVE  Sensitive     Extended ESBL NEGATIVE Sensitive     * FEW KLEBSIELLA PNEUMONIAE  Gastrointestinal Panel by PCR , Stool     Status: None   Collection Time: 03/03/17  1:44 AM  Result Value Ref Range Status   Campylobacter species NOT DETECTED NOT DETECTED Final   Plesimonas shigelloides NOT DETECTED NOT DETECTED Final   Salmonella species NOT DETECTED NOT DETECTED Final   Yersinia enterocolitica NOT DETECTED NOT DETECTED Final   Vibrio species NOT DETECTED NOT DETECTED Final   Vibrio cholerae NOT DETECTED NOT DETECTED Final   Enteroaggregative E coli (EAEC) NOT DETECTED NOT DETECTED Final   Enteropathogenic E coli (EPEC) NOT DETECTED NOT DETECTED Final   Enterotoxigenic E coli (ETEC) NOT DETECTED NOT DETECTED Final   Shiga like toxin producing E coli (STEC) NOT DETECTED NOT DETECTED Final   Shigella/Enteroinvasive E coli (EIEC) NOT DETECTED NOT DETECTED Final   Cryptosporidium NOT DETECTED NOT DETECTED Final  Cyclospora cayetanensis NOT DETECTED NOT DETECTED Final   Entamoeba histolytica NOT DETECTED NOT DETECTED Final   Giardia lamblia NOT DETECTED NOT DETECTED Final   Adenovirus F40/41 NOT DETECTED NOT DETECTED Final   Astrovirus NOT DETECTED NOT DETECTED Final   Norovirus GI/GII NOT DETECTED NOT DETECTED Final   Rotavirus Ashara Lounsbury NOT DETECTED NOT DETECTED Final   Sapovirus (I, II, IV, and V) NOT DETECTED NOT DETECTED Final  Clostridium Difficile by PCR     Status: None   Collection Time: 03/04/17  9:00 AM  Result Value Ref Range Status   Toxigenic C Difficile by pcr NEGATIVE NEGATIVE Final    Comment: Patient is colonized with non toxigenic C. difficile. May not need treatment unless significant symptoms are present. Performed at [facility], [place] [address], [place], [place] [address]          Radiology Studies: No results found.      Scheduled Meds: . acetaminophen  1,000 mg Oral TID  . cephALEXin  500 mg Oral Q6H  . citalopram  10 mg Oral Daily  .  diphenoxylate-atropine  2 tablet Oral QID  . feeding supplement (GLUCERNA SHAKE)  237 mL Oral BID BM  . ferrous sulfate  325 mg Oral BID WC  . folic acid  1 mg Oral Daily  . gabapentin  300 mg Oral BID  . insulin aspart  0-9 Units Subcutaneous TID WC  . insulin aspart  2 Units Subcutaneous TID WC  . insulin glargine  8 Units Subcutaneous Daily  . lipase/protease/amylase  36,000 Units Oral TID WC  . metroNIDAZOLE  500 mg Oral Q8H  . protein supplement shake  11 oz Oral Q24H   Continuous Infusions: . magnesium sulfate 1 - 4 g bolus IVPB    . methocarbamol (ROBAXIN)  IV       LOS: 9 days    Time spent: over 20 minutes    Fayrene Helper, MD PhD [facility] Pager [phone]  If 7PM-7AM, please contact night-coverage [email or web address] Password [email or web address] 03/07/2017, 9:50 AM

## 2017-03-07 NOTE — Progress Notes (Signed)
[facility] Progress Note Covering for Drs. Mann/Hung this weekend  Since last GI note: Still with diarrhea, pretty uncontrollable.  Usually in AM and it tapers off in the afternoon.  Not nocturnal   Objective: Vital signs in last 24 hours: Temp:  [98.2 F (36.8 C)-98.4 F (36.9 C)] 98.4 F (36.9 C) (12/08 1405) Pulse Rate:  [86-95] 86 (12/08 1405) Resp:  [16-18] 16 (12/08 1405) BP: (101-119)/(57-69) 106/68 (12/08 1405) SpO2:  [93 %-95 %] 95 % (12/08 1405) Last BM Date: 03/06/17 General: alert and oriented times 3 Heart: regular rate and rythm Abdomen: soft, non-tender, non-distended, normal bowel sounds   Lab Results: Recent Labs    03/05/17 0539 03/06/17 0537  WBC 7.7 6.3  HGB 8.3* 8.1*  PLT 300 317  MCV 93.6 93.8   Recent Labs    03/05/17 0546 03/06/17 0537 03/07/17 0613  NA 136 140 139  K 4.0 3.7 3.9  CL 103 105 104  CO2 28 30 30   GLUCOSE 188* 163* 169*  BUN 7 8 9   CREATININE <0.30* 0.33* 0.37*  CALCIUM 7.8* 7.7* 7.7*   Recent Labs    03/05/17 0546  ALBUMIN 1.3*   Medications: Scheduled Meds: . acetaminophen  1,000 mg Oral TID  . cephALEXin  500 mg Oral Q6H  . citalopram  10 mg Oral Daily  . diphenoxylate-atropine  2 tablet Oral QID  . feeding supplement (GLUCERNA SHAKE)  237 mL Oral BID BM  . ferrous sulfate  325 mg Oral BID WC  . folic acid  1 mg Oral Daily  . gabapentin  300 mg Oral BID  . insulin aspart  0-9 Units Subcutaneous TID WC  . insulin aspart  2 Units Subcutaneous TID WC  . insulin glargine  8 Units Subcutaneous Daily  . lipase/protease/amylase  36,000 Units Oral TID WC  . metroNIDAZOLE  500 mg Oral Q8H  . protein supplement shake  11 oz Oral Q24H   Continuous Infusions: . methocarbamol (ROBAXIN)  IV     PRN Meds:.Influenza vac split quadrivalent PF, iopamidol, methocarbamol (ROBAXIN)  IV, methocarbamol, morphine injection, ondansetron (ZOFRAN) IV, oxyCODONE, promethazine, traMADol    Assessment/Plan: 26 y.o.  female with non-bloody diarrhea, perirectal abscess.  She says diarrhea is probably better but certainly not gone with creon and scheduled lomotil.  I will add scheduled imodium as well this afternoon. 2 pills tid.     Milus Banister, MD  03/07/2017, 4:43 PM [facility] Pager [phone]

## 2017-03-07 NOTE — Progress Notes (Signed)
[facility]  [address] [place], [place] [address] Phone: [phone]  FAX: [phone]      [name] [id number] Jun 20, 1990  CARE TEAM:  PCP: Kathryn Blade, MD  Outpatient Care Team: Patient Care Team: Kathryn Blade, MD as PCP - General (Internal Medicine)  Inpatient Treatment Team: Treatment Team: Attending Provider: Elodia Florence., MD; Consulting Physician: Kathryn Craver, MD; Rounding Team: Kathryn Bushman, MD; Registered Nurse: Kathryn Sax, RN; Technician: [name], [place]; Physical Therapist: Ernesta [name], PT; Registered Nurse: Kathryn Ellison, RN   Problem List:   Principal Problem:   MDD (major depressive disorder), recurrent episode, mild ([place]) Active Problems:   Diabetes type 1, uncontrolled ([place])   Protein-calorie malnutrition, severe ([place])   DKA (diabetic ketoacidosis) ([place])   Diarrhea   Lower GI bleed   6 Days Post-Op  03/01/2017  POSTOP DIAGNOSIS:   Right peri rectal abscess tracking over her sacrum  PROCEDURE:   Procedure(s): IRRIGATION AND DEBRIDEMENT PERIRECTAL ABSCESS  SURGEON:   [name], M.D.   Assessment  Recovering but struggling  Plan:  -d/c foley to avoid UTI -sitz baths - avoid aggressive packing w pain -improve pain control -SLOW DOWN DIARRHEA - agree w GI w Lomotil.  Inc.  Add iron -DM control -malnutrition - glucerna, etc -depression control -VTE prophylaxis- SCDs, etc -mobilize as tolerated to help recovery  Will follow wound qMon/Thu until d/c  20 minutes spent in review, evaluation, examination, counseling, and coordination of care.  More than 50% of that time was spent in counseling.  [name], M.D., F.A.C.S. Gastrointestinal and Minimally Invasive Surgery [facility] [address] [place], [place] [address] [phone] Main / Paging   03/07/2017    Subjective: (Chief  complaint)  Sore C/o diarrhea RN in room Trying to use commode Foley in since surgery  Objective:  Vital signs:  Vitals:   03/06/17 1419 03/06/17 2043 03/07/17 0013 03/07/17 0531  BP: 117/71 119/69 (!) 112/57 101/60  Pulse: 96 95 86 91  Resp: 19 18 18 17   Temp: 97.6 F (36.4 C) 98.2 F (36.8 C) 98.2 F (36.8 C) 98.2 F (36.8 C)  TempSrc: Oral Oral Oral Oral  SpO2: 94% 93% 95% 93%  Weight:      Height:        Last BM Date: 03/06/17  Intake/Output   Yesterday:  12/07 0701 - 12/08 0700 In: -  Out: 2700 [Urine:1200; Stool:1500] This shift:  No intake/output data recorded.  Bowel function:  Flatus: YES  BM:  YES  Drain: (No drain)   Physical Exam:  General: Pt awake/alert/oriented x4 in mild acute distress Eyes: PERRL, normal EOM.  Sclera clear.  No icterus Neuro: CN II-XII intact w/o focal sensory/motor deficits. Lymph: No head/neck/groin lymphadenopathy Psych:  No delerium/psychosis/paranoia HENT: Normocephalic, Mucus membranes moist.  No thrush Neck: Supple, No tracheal deviation Chest: No chest wall pain w good excursion CV:  Pulses intact.  Regular rhythm MS: Normal AROM mjr joints.  No obvious deformity Abdomen: Soft.  Nondistended.  Nontender.  No evidence of peritonitis.  No incarcerated hernias.  GU: LEFT perirectal/gluteal wound w clean granulation.  No cellulitis  Ext:  No deformity.  No mjr edema.  No cyanosis Skin: No petechiae / purpura  Results:   Labs: Results for orders placed or performed during the hospital encounter of 02/26/17 (from the past 48 hour(s))  Glucose, capillary     Status:  Abnormal   Collection Time: 03/05/17 11:48 AM  Result Value Ref Range   Glucose-Capillary 130 (H) 65 - 99 mg/dL  Glucose, capillary     Status: Abnormal   Collection Time: 03/05/17  4:49 PM  Result Value Ref Range   Glucose-Capillary 160 (H) 65 - 99 mg/dL  Glucose, capillary     Status: Abnormal   Collection Time: 03/05/17  8:47 PM  Result  Value Ref Range   Glucose-Capillary 191 (H) 65 - 99 mg/dL  CBC     Status: Abnormal   Collection Time: 03/06/17  5:37 AM  Result Value Ref Range   WBC 6.3 4.0 - 10.5 K/uL   RBC 2.60 (L) 3.87 - 5.11 MIL/uL   Hemoglobin 8.1 (L) 12.0 - 15.0 g/dL   HCT 24.4 (L) 36.0 - 46.0 %   MCV 93.8 78.0 - 100.0 fL   MCH 31.2 26.0 - 34.0 pg   MCHC 33.2 30.0 - 36.0 g/dL   RDW 14.6 11.5 - 15.5 %   Platelets 317 150 - 400 K/uL  Basic metabolic panel     Status: Abnormal   Collection Time: 03/06/17  5:37 AM  Result Value Ref Range   Sodium 140 135 - 145 mmol/L   Potassium 3.7 3.5 - 5.1 mmol/L   Chloride 105 101 - 111 mmol/L   CO2 30 22 - 32 mmol/L   Glucose, Bld 163 (H) 65 - 99 mg/dL   BUN 8 6 - 20 mg/dL   Creatinine, Ser 0.33 (L) 0.44 - 1.00 mg/dL   Calcium 7.7 (L) 8.9 - 10.3 mg/dL   GFR calc non Af Amer >60 >60 mL/min   GFR calc Af Amer >60 >60 mL/min    Comment: (NOTE) The eGFR has been calculated using the CKD EPI equation. This calculation has not been validated in all clinical situations. eGFR's persistently <60 mL/min signify possible Chronic Kidney Disease.    Anion gap 5 5 - 15  Glucose, capillary     Status: Abnormal   Collection Time: 03/06/17  8:09 AM  Result Value Ref Range   Glucose-Capillary 226 (H) 65 - 99 mg/dL  Glucose, capillary     Status: Abnormal   Collection Time: 03/06/17 11:43 AM  Result Value Ref Range   Glucose-Capillary 213 (H) 65 - 99 mg/dL  Glucose, capillary     Status: Abnormal   Collection Time: 03/06/17  5:19 PM  Result Value Ref Range   Glucose-Capillary 246 (H) 65 - 99 mg/dL  Glucose, capillary     Status: Abnormal   Collection Time: 03/06/17  8:41 PM  Result Value Ref Range   Glucose-Capillary 224 (H) 65 - 99 mg/dL  Basic metabolic panel     Status: Abnormal   Collection Time: 03/07/17  6:13 AM  Result Value Ref Range   Sodium 139 135 - 145 mmol/L   Potassium 3.9 3.5 - 5.1 mmol/L   Chloride 104 101 - 111 mmol/L   CO2 30 22 - 32 mmol/L   Glucose,  Bld 169 (H) 65 - 99 mg/dL   BUN 9 6 - 20 mg/dL   Creatinine, Ser 0.37 (L) 0.44 - 1.00 mg/dL   Calcium 7.7 (L) 8.9 - 10.3 mg/dL   GFR calc non Af Amer >60 >60 mL/min   GFR calc Af Amer >60 >60 mL/min    Comment: (NOTE) The eGFR has been calculated using the CKD EPI equation. This calculation has not been validated in all clinical situations. eGFR's persistently <60 mL/min signify  possible Chronic Kidney Disease.    Anion gap 5 5 - 15  Magnesium     Status: Abnormal   Collection Time: 03/07/17  6:13 AM  Result Value Ref Range   Magnesium 1.6 (L) 1.7 - 2.4 mg/dL    Imaging / Studies: No results found.  Medications / Allergies: per chart  Antibiotics: Anti-infectives (From admission, onward)   Start     Dose/Rate Route Frequency Ordered Stop   03/06/17 1200  cephALEXin (KEFLEX) capsule 500 mg     500 mg Oral Every 6 hours 03/06/17 1117 03/13/17 2359   03/03/17 2200  ceFAZolin (ANCEF) IVPB 1 g/50 mL premix  Status:  Discontinued     1 g 100 mL/hr over 30 Minutes Intravenous Every 8 hours 03/03/17 1742 03/06/17 1117   03/03/17 2200  metroNIDAZOLE (FLAGYL) tablet 500 mg     500 mg Oral Every 8 hours 03/03/17 1742 03/13/17 2359   03/02/17 1800  vancomycin (VANCOCIN) 50 mg/mL oral solution 125 mg  Status:  Discontinued     125 mg Oral Every 6 hours 03/02/17 1521 03/05/17 1341   02/28/17 1300  piperacillin-tazobactam (ZOSYN) IVPB 3.375 g  Status:  Discontinued     3.375 g 12.5 mL/hr over 240 Minutes Intravenous Every 8 hours 02/28/17 1227 03/03/17 1741        Note: Portions of this report may have been transcribed using voice recognition software. Every effort was made to ensure accuracy; however, inadvertent computerized transcription errors may be present.   Any transcriptional errors that result from this process are unintentional.     [name], M.D., F.A.C.S. Gastrointestinal and Minimally Invasive Surgery [facility] [address] [place], [place] [address] [phone] Main / Paging   03/07/2017

## 2017-03-07 NOTE — Progress Notes (Signed)
PT Cancellation Note  Patient Details Name: [name] MRN: [id number] DOB: 05/27/1990   Cancelled Treatment:     Attempted to see patient 2xs today. 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 Will check on patient tomorrow.    Marella Bile 03/07/2017, 6:07 PM  Marella Bile, PT Pager: [phone] 03/07/2017

## 2017-03-08 LAB — CBC
HCT: 25.7 % — ABNORMAL LOW (ref 36.0–46.0)
Hemoglobin: 8.4 g/dL — ABNORMAL LOW (ref 12.0–15.0)
MCH: 30.8 pg (ref 26.0–34.0)
MCHC: 32.7 g/dL (ref 30.0–36.0)
MCV: 94.1 fL (ref 78.0–100.0)
Platelets: 371 10*3/uL (ref 150–400)
RBC: 2.73 MIL/uL — ABNORMAL LOW (ref 3.87–5.11)
RDW: 14.3 % (ref 11.5–15.5)
WBC: 6.4 10*3/uL (ref 4.0–10.5)

## 2017-03-08 LAB — COMPREHENSIVE METABOLIC PANEL
ALT: 8 U/L — ABNORMAL LOW (ref 14–54)
AST: 17 U/L (ref 15–41)
Albumin: 1.3 g/dL — ABNORMAL LOW (ref 3.5–5.0)
Alkaline Phosphatase: 78 U/L (ref 38–126)
Anion gap: 6 (ref 5–15)
BUN: 9 mg/dL (ref 6–20)
CO2: 31 mmol/L (ref 22–32)
Calcium: 7.7 mg/dL — ABNORMAL LOW (ref 8.9–10.3)
Chloride: 102 mmol/L (ref 101–111)
Creatinine, Ser: 0.34 mg/dL — ABNORMAL LOW (ref 0.44–1.00)
GFR calc Af Amer: >60 mL/min (ref >60)
GFR calc non Af Amer: >60 mL/min (ref >60)
Glucose, Bld: 118 mg/dL — ABNORMAL HIGH (ref 65–99)
Potassium: 3.5 mmol/L (ref 3.5–5.1)
Sodium: 139 mmol/L (ref 135–145)
Total Bilirubin: 0.2 mg/dL — ABNORMAL LOW (ref 0.3–1.2)
Total Protein: 5.9 g/dL — ABNORMAL LOW (ref 6.5–8.1)

## 2017-03-08 LAB — MAGNESIUM: Magnesium: 1.9 mg/dL (ref 1.7–2.4)

## 2017-03-08 LAB — GLUCOSE, CAPILLARY
Glucose-Capillary: 158 mg/dL — ABNORMAL HIGH (ref 65–99)
Glucose-Capillary: 104 mg/dL — ABNORMAL HIGH (ref 65–99)
Glucose-Capillary: 58 mg/dL — ABNORMAL LOW (ref 65–99)
Glucose-Capillary: 114 mg/dL — ABNORMAL HIGH (ref 65–99)

## 2017-03-08 LAB — PHOSPHORUS: Phosphorus: 3.6 mg/dL (ref 2.5–4.6)

## 2017-03-08 MED ORDER — CITALOPRAM HYDROBROMIDE 20 MG PO TABS
20.0000 mg | ORAL_TABLET | Freq: Every day | ORAL | Status: DC
  Administered 2017-03-09 – 2017-03-14 (×5): 20 mg via ORAL
  Filled 2017-03-08 (×5): qty 1
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
## 2017-03-08 NOTE — Progress Notes (Addendum)
PROGRESS NOTE    [name]  EYC:[id number] DOB: [birth year] DOA: 02/26/2017 PCP: Kathryn Blade, MD    Brief Narrative: [name] is [name] 26 y.o. female with medical history significant of type 1 DM with h/o remote DKA; HTN; and gastroparesis presenting with AMS.  Today, her husband went to work early this AM.  They work at the same facility and she was due to come in later.  She called him about 11:15 and asked him to come home because she was "having [name] panic attack."  She specifically wanted him to help her.  She finally took [name] nap for [name] couple of hours.  She soiled herself and he decided to take her to the doctor.  She was somewhat incoherent with garbled speech and hallucinations.  For example, she would try to say the word for bathroom and would forget the word, that kind of thing.  She soiled herself again.  He thought it might be [name] low blood sugar and instead it was high and her temperature was low.  She has been having diarrhea since last night - awoke overnight to go to the bathroom several times.  She does have soreness and rash on her buttocks.  She also complained of [name] boil on her bottom last week; he thought it was better but it was noticeable today.   She has had DKA, but the last time was [name] few years ago.  No sick contacts.  They work for [employer] and she has been complaining of being extra tired but they thought it was due to working [name] lot of overtime.  She did eat/drink normally yesterday.  She drank some water when her husband got home today.  She complained of n/v to him but he did not witness this.  Assessment & Plan:   Principal Problem:   MDD (major depressive disorder), recurrent episode, mild (HCC) Active Problems:   Diabetes type 1, uncontrolled (HCC)   Protein-calorie malnutrition, severe ([place])   DKA (diabetic ketoacidosis) (HCC)   Diarrhea   Lower GI bleed   DKA with metabolic encephalopathy and  severe acidosis venous blood gas pH 6.8 on presentation:  -  Resolved - Pt with elevated BG's, will restart lantus, continue SSI (uptitrate as appropriate, she's on lantus 15 units BID and 8 units lispro TID with SSI  Poorly controlled type 1 diabetes -H/o Medication noncompliance -Patient reports not able to afford insulin, she reports one half brand insulin make her feel bloated. -a1c 12.2 -Diabetes educator and case manager consulted. -BG's improved with lantus 8 units daily with 2 units aspart with meals  Hypoglycemia event   ? due to inconsistent oral intake (noted 12/3)  Continue adjust insulin dose CTM  periRectal abscess  -CT imaging with complex fluids collection -s/p debridement in OR on 12/2,  - surgical culture + Klebsiella (holding for anaerobes),  zosyn d/ced, changed to ancef, with flagyl to cover anaerobes, Change to oral abx, plan to treat for total of 14 days. Last dose of abx on 12/14. -will follow general surgery recommendations  1/4 Blood cx with lactobacillus:   Suspect this maybe contaminant (only 1 of 4 bcx with this result).  Discussed with ID today.  Will have 14 day course of flagyl, regardless.   Acute on chronic anemia -Hemoglobin 6.4 on admission, status post PRBC transfusionx1 on November 30, prbc x1 on 12/4 -B12 unremarkable, folate slightly low, Start folic acid supplement -transfuse prn to keep hgb >7. -FOBT+, Case discussed with  GI Dr Benson [name] (difficult to say significance with wound)  Chronic diarrhea//weight loss with anemia/malnutrition -Report followed by GI Dr. Collene Mares -Ct ab [name] + abscess and ascites but no bowel wall thickening -GI ordered c diff which showed antigen positive, toxin negative. GI stopped oral vanc given posible improvement with creon.   - appreciate GI recs, started creon 12/5.  Started lomotil 12/7.  May consider FFS if no improvement.   - -replace rectal tube  Moisture associated skin damage around anal area  Likely from chronic diarrhea Wound care consulted  Hypokalemia  /hypomagnesemia Replete k/mag  Malnutrition. moderate: CT ab/pel" Moderate volume ascites with diffuse soft tissue and body wall edema." Albumin 1.4 Nutrition consulted  Bilateral lower extremity edema  RUE edema: likely third spacing Will check RUE [place]. Venous doppler no DVT. D/c ivf Nutrition supplement.  Anxiety/ depression: Psych input appreciated, recommend celexa 90m daily for [name] few days, increase to 265mdaily if able to tolerate.   DVT prophylaxis: SCD Code Status: full  Family Communication: husband at bedside Disposition Plan: pending improvement in pain and diarrhea   Consultants:   Surgery and GI  Procedures: (Don't include imaging studies which can be auto populated. Include things that cannot be auto populated i.e. Echo, Carotid and venous dopplers, Foley, Bipap, HD, tubes/drains, wound vac, central lines etc)  Perirectal abscess debridement  Antimicrobials: (specify start and planned stop date. Auto populated tables are space occupying and do not give end dates)  Zosyn 12/1-12/4  Ancef/flagyl 12/4 - 12/7, oral keflex and flagyl from 12/7  Vancomycin oral 12/3- 12/4   Subjective: Still uncomfortable with frequent stools, but improving. Walking halls today.  Objective: Vitals:   03/07/17 1405 03/07/17 2031 03/08/17 0511 03/08/17 1343  BP: 106/68 102/67 116/76 109/73  Pulse: 86 89 87 89  Resp: 16 18 18 18   Temp: 98.4 F (36.9 C) 98 F (36.7 C) 97.8 F (36.6 C) 98 F (36.7 C)  TempSrc: Oral Oral Oral Oral  SpO2: 95% 97% 96% 95%  Weight:      Height:       No intake or output data in the 24 hours ending 03/08/17 1427 Filed Weights   02/26/17 1708  Weight: 52.2 kg (115 lb)    Examination:  General: No acute distress. Cardiovascular: Heart sounds show [name] regular rate, and rhythm. No gallops or rubs. No murmurs. No JVD. Lungs: Clear to auscultation bilaterally with good air movement. No rales, rhonchi or wheezes. Abdomen: Soft,  nontender, nondistended with normal active bowel sounds. No masses. No hepatosplenomegaly. Neurological: Alert and oriented 3. Moves all extremities 4 with equal strength. Cranial nerves II through XII grossly intact. Skin: Warm and dry. No rashes or lesions. Extremities: Bilateral LE edema, RUE edema Psychiatric: Mood and affect are normal. Insight and judgment are appropriate.   Data Reviewed: I have personally reviewed following labs and imaging studies  CBC: Recent Labs  Lab 03/03/17 0324 03/04/17 0631 03/05/17 0539 03/06/17 0537 03/08/17 1147  WBC 7.5 7.7 7.7 6.3 6.4  NEUTROABS  --  4.9  --   --   --   HGB 7.1* 8.7* 8.3* 8.1* 8.4*  HCT 21.4* 26.5* 25.0* 24.4* 25.7*  MCV 93.9 93.3 93.6 93.8 94.1  PLT 191 244 300 317 [id number] Basic Metabolic Panel: Recent Labs  Lab 03/02/17 0346 03/03/17 0324 03/04/17 0631 03/05/17 0546 03/06/17 0537 03/07/17 0613 03/08/17 0433  NA 137 136 135 136 140 139 139  K 3.4* 3.9 4.1 4.0 3.7 3.9  3.5  CL 107 107 103 103 105 104 102  CO2 27 26 27 28 30 30 31   GLUCOSE 68 234* 338* 188* 163* 169* 118*  BUN <5* 7 10 7 8 9 9   CREATININE <0.30* 0.43* 0.42* <0.30* 0.33* 0.37* 0.34*  CALCIUM 7.2* 7.5* 7.8* 7.8* 7.7* 7.7* 7.7*  MG 1.9 2.0 1.8  --   --  1.6* 1.9  PHOS  --   --   --   --   --   --  3.6   GFR: Estimated Creatinine Clearance: 87 mL/min ([name]) (by C-G formula based on SCr of 0.34 mg/dL (L)). Liver Function Tests: Recent Labs  Lab 03/05/17 0546 03/08/17 0433  AST  --  17  ALT  --  8*  ALKPHOS  --  78  BILITOT  --  0.2*  PROT  --  5.9*  ALBUMIN 1.3* 1.3*   No results for input(s): LIPASE, AMYLASE in the last 168 hours. No results for input(s): AMMONIA in the last 168 hours. Coagulation Profile: No results for input(s): INR, PROTIME in the last 168 hours. Cardiac Enzymes: No results for input(s): CKTOTAL, CKMB, CKMBINDEX, TROPONINI in the last 168 hours. BNP (last 3 results) No results for input(s): PROBNP in the last 8760  hours. HbA1C: No results for input(s): HGBA1C in the last 72 hours. CBG: Recent Labs  Lab 03/07/17 1203 03/07/17 1703 03/07/17 2029 03/08/17 0734 03/08/17 1137  GLUCAP 146* 112* 115* 158* 104*   Lipid Profile: No results for input(s): CHOL, HDL, LDLCALC, TRIG, CHOLHDL, LDLDIRECT in the last 72 hours. Thyroid Function Tests: No results for input(s): TSH, T4TOTAL, FREET4, T3FREE, THYROIDAB in the last 72 hours. Anemia Panel: No results for input(s): VITAMINB12, FOLATE, FERRITIN, TIBC, IRON, RETICCTPCT in the last 72 hours. Sepsis Labs: Recent Labs  Lab 03/02/17 0346  PROCALCITON <0.10    Recent Results (from the past 240 hour(s))  Blood Culture (routine x 2)     Status: None   Collection Time: 02/26/17  4:45 PM  Result Value Ref Range Status   Specimen Description BLOOD RIGHT ANTECUBITAL  Final   Special Requests   Final    BOTTLES DRAWN AEROBIC AND ANAEROBIC Blood Culture adequate volume   Culture   Final    NO GROWTH 5 DAYS Performed at [facility], [address] [place], [place] [address]    Report Status 03/03/2017 FINAL  Final  Blood Culture (routine x 2)     Status: Abnormal   Collection Time: 02/26/17  4:55 PM  Result Value Ref Range Status   Specimen Description BLOOD LEFT ANTECUBITAL  Final   Special Requests   Final    BOTTLES DRAWN AEROBIC AND ANAEROBIC Blood Culture adequate volume   Culture  Setup Time   Final    IN BOTH AEROBIC AND ANAEROBIC BOTTLES CRITICAL RESULT CALLED TO, READ BACK BY AND VERIFIED WITH: B. GREEN PHARMD, AT 2333 02/27/17 BY D. VANHOOK CORRECTED RESULTS GRAM POSITIVE RODS PREVIOUSLY REPORTED AS: GRAM POSITIVE COCCI IN CHAINS CORRECTED RESULTS CALLED TO: PHARMD M BELL [id number] MLM    Culture (Jaylee Lantry)  Final    LACTOBACILLUS SPECIES Standardized susceptibility testing for this organism is not available. Performed at [facility], [place] [address], [place], [place] [address]    Report Status 03/02/2017 FINAL  Final  Blood  Culture ID Panel (Reflexed)     Status: None   Collection Time: 02/26/17  4:55 PM  Result Value Ref Range Status  Enterococcus species NOT DETECTED NOT DETECTED Final   Listeria monocytogenes NOT DETECTED NOT DETECTED Final   Staphylococcus species NOT DETECTED NOT DETECTED Final   Staphylococcus aureus NOT DETECTED NOT DETECTED Final   Streptococcus species NOT DETECTED NOT DETECTED Final   Streptococcus agalactiae NOT DETECTED NOT DETECTED Final   Streptococcus pneumoniae NOT DETECTED NOT DETECTED Final   Streptococcus pyogenes NOT DETECTED NOT DETECTED Final   Acinetobacter baumannii NOT DETECTED NOT DETECTED Final   Enterobacteriaceae species NOT DETECTED NOT DETECTED Final   Enterobacter cloacae complex NOT DETECTED NOT DETECTED Final   Escherichia coli NOT DETECTED NOT DETECTED Final   Klebsiella oxytoca NOT DETECTED NOT DETECTED Final   Klebsiella pneumoniae NOT DETECTED NOT DETECTED Final   Proteus species NOT DETECTED NOT DETECTED Final   Serratia marcescens NOT DETECTED NOT DETECTED Final   Haemophilus influenzae NOT DETECTED NOT DETECTED Final   Neisseria meningitidis NOT DETECTED NOT DETECTED Final   Pseudomonas aeruginosa NOT DETECTED NOT DETECTED Final   Candida albicans NOT DETECTED NOT DETECTED Final   Candida glabrata NOT DETECTED NOT DETECTED Final   Candida krusei NOT DETECTED NOT DETECTED Final   Candida parapsilosis NOT DETECTED NOT DETECTED Final   Candida tropicalis NOT DETECTED NOT DETECTED Final    Comment: Performed at [facility], [address] [place], [place] [address]  C difficile quick scan w PCR reflex     Status: Abnormal   Collection Time: 02/26/17  7:45 PM  Result Value Ref Range Status   C Diff antigen POSITIVE (Mylin Gignac) NEGATIVE Final   C Diff toxin NEGATIVE NEGATIVE Final   C Diff interpretation Results are indeterminate. See PCR results.  Final  Culture, blood (x 2)     Status: None   Collection Time: 02/26/17 11:00 PM  Result Value Ref  Range Status   Specimen Description BLOOD LEFT WRIST  Final   Special Requests   Final    BOTTLES DRAWN AEROBIC AND ANAEROBIC Blood Culture adequate volume   Culture   Final    NO GROWTH 5 DAYS Performed at [facility], [address] [place], [place] [address]    Report Status 03/04/2017 FINAL  Final  Urine culture     Status: None   Collection Time: 02/26/17 11:00 PM  Result Value Ref Range Status   Specimen Description URINE, RANDOM  Final   Special Requests NONE  Final   Culture   Final    NO GROWTH Performed at [facility], [place] [address], [place], [place] [address]    Report Status 02/28/2017 FINAL  Final  MRSA PCR Screening     Status: None   Collection Time: 02/26/17 11:00 PM  Result Value Ref Range Status   MRSA by PCR NEGATIVE NEGATIVE Final    Comment:        The GeneXpert MRSA Assay (FDA approved for NASAL specimens only), is one component of [name] comprehensive MRSA colonization surveillance program. It is not intended to diagnose MRSA infection nor to guide or monitor treatment for MRSA infections.   Culture, blood (x 2)     Status: None   Collection Time: 02/26/17 11:05 PM  Result Value Ref Range Status   Specimen Description BLOOD LEFT HAND  Final   Special Requests IN PEDIATRIC BOTTLE Blood Culture adequate volume  Final   Culture   Final    NO GROWTH 5 DAYS Performed at [facility], [place] [address], [place], [place] [address]  Report Status 03/04/2017 FINAL  Final  Gastrointestinal Panel by PCR , Stool     Status: None   Collection Time: 02/27/17  8:49 AM  Result Value Ref Range Status   Campylobacter species NOT DETECTED NOT DETECTED Final   Plesimonas shigelloides NOT DETECTED NOT DETECTED Final   Salmonella species NOT DETECTED NOT DETECTED Final   Yersinia enterocolitica NOT DETECTED NOT DETECTED Final   Vibrio species NOT DETECTED NOT DETECTED Final   Vibrio cholerae NOT DETECTED NOT DETECTED Final   Enteroaggregative E  coli (EAEC) NOT DETECTED NOT DETECTED Final   Enteropathogenic E coli (EPEC) NOT DETECTED NOT DETECTED Final   Enterotoxigenic E coli (ETEC) NOT DETECTED NOT DETECTED Final   Shiga like toxin producing E coli (STEC) NOT DETECTED NOT DETECTED Final   Shigella/Enteroinvasive E coli (EIEC) NOT DETECTED NOT DETECTED Final   Cryptosporidium NOT DETECTED NOT DETECTED Final   Cyclospora cayetanensis NOT DETECTED NOT DETECTED Final   Entamoeba histolytica NOT DETECTED NOT DETECTED Final   Giardia lamblia NOT DETECTED NOT DETECTED Final   Adenovirus F40/41 NOT DETECTED NOT DETECTED Final   Astrovirus NOT DETECTED NOT DETECTED Final   Norovirus GI/GII NOT DETECTED NOT DETECTED Final   Rotavirus [name] NOT DETECTED NOT DETECTED Final   Sapovirus (I, II, IV, and V) NOT DETECTED NOT DETECTED Final  Aerobic/Anaerobic Culture (surgical/deep wound)     Status: None   Collection Time: 03/01/17 11:00 AM  Result Value Ref Range Status   Specimen Description ABSCESS  Final   Special Requests NONE  Final   Gram Stain   Final    ABUNDANT WBC PRESENT, PREDOMINANTLY PMN FEW GRAM POSITIVE RODS RARE GRAM POSITIVE COCCI IN PAIRS RARE GRAM NEGATIVE RODS    Culture   Final    FEW KLEBSIELLA PNEUMONIAE MODERATE BACTEROIDES FRAGILIS BETA LACTAMASE POSITIVE Performed at [facility], [address], [place], [place] [address]    Report Status 03/07/2017 FINAL  Final   Organism ID, Bacteria KLEBSIELLA PNEUMONIAE  Final      Susceptibility   Klebsiella pneumoniae - MIC*    AMPICILLIN >=32 RESISTANT Resistant     CEFAZOLIN <=4 SENSITIVE Sensitive     CEFEPIME <=1 SENSITIVE Sensitive     CEFTAZIDIME <=1 SENSITIVE Sensitive     CEFTRIAXONE <=1 SENSITIVE Sensitive     CIPROFLOXACIN <=0.25 SENSITIVE Sensitive     GENTAMICIN <=1 SENSITIVE Sensitive     IMIPENEM <=0.25 SENSITIVE Sensitive     TRIMETH/SULFA <=20 SENSITIVE Sensitive     AMPICILLIN/SULBACTAM >=32 RESISTANT Resistant     PIP/TAZO 16 SENSITIVE  Sensitive     Extended ESBL NEGATIVE Sensitive     * FEW KLEBSIELLA PNEUMONIAE  Gastrointestinal Panel by PCR , Stool     Status: None   Collection Time: 03/03/17  1:44 AM  Result Value Ref Range Status   Campylobacter species NOT DETECTED NOT DETECTED Final   Plesimonas shigelloides NOT DETECTED NOT DETECTED Final   Salmonella species NOT DETECTED NOT DETECTED Final   Yersinia enterocolitica NOT DETECTED NOT DETECTED Final   Vibrio species NOT DETECTED NOT DETECTED Final   Vibrio cholerae NOT DETECTED NOT DETECTED Final   Enteroaggregative E coli (EAEC) NOT DETECTED NOT DETECTED Final   Enteropathogenic E coli (EPEC) NOT DETECTED NOT DETECTED Final   Enterotoxigenic E coli (ETEC) NOT DETECTED NOT DETECTED Final   Shiga like toxin producing E coli (STEC) NOT DETECTED NOT DETECTED Final   Shigella/Enteroinvasive E coli (EIEC) NOT DETECTED NOT DETECTED  Final   Cryptosporidium NOT DETECTED NOT DETECTED Final   Cyclospora cayetanensis NOT DETECTED NOT DETECTED Final   Entamoeba histolytica NOT DETECTED NOT DETECTED Final   Giardia lamblia NOT DETECTED NOT DETECTED Final   Adenovirus F40/41 NOT DETECTED NOT DETECTED Final   Astrovirus NOT DETECTED NOT DETECTED Final   Norovirus GI/GII NOT DETECTED NOT DETECTED Final   Rotavirus [place] NOT DETECTED NOT DETECTED Final   Sapovirus (I, II, IV, and V) NOT DETECTED NOT DETECTED Final  Clostridium Difficile by PCR     Status: None   Collection Time: 03/04/17  9:00 AM  Result Value Ref Range Status   Toxigenic C Difficile by pcr NEGATIVE NEGATIVE Final    Comment: Patient is colonized with non toxigenic C. difficile. May not need treatment unless significant symptoms are present. Performed at [facility], [place] [address], [place], [place] [address]          Radiology Studies: No results found.      Scheduled Meds: . acetaminophen  1,000 mg Oral TID  . cephALEXin  500 mg Oral Q6H  . diphenoxylate-atropine  2 tablet Oral QID  .  feeding supplement (GLUCERNA SHAKE)  237 mL Oral BID BM  . ferrous sulfate  325 mg Oral BID WC  . folic acid  1 mg Oral Daily  . gabapentin  300 mg Oral BID  . insulin aspart  0-9 Units Subcutaneous TID WC  . insulin aspart  2 Units Subcutaneous TID WC  . insulin glargine  8 Units Subcutaneous Daily  . lipase/protease/amylase  36,000 Units Oral TID WC  . loperamide  2 mg Oral TID AC  . metroNIDAZOLE  500 mg Oral Q8H  . protein supplement shake  11 oz Oral Q24H   Continuous Infusions: . methocarbamol (ROBAXIN)  IV       LOS: 10 days    Time spent: over 20 minutes    Fayrene Helper, MD PhD [facility] Pager [phone]  If 7PM-7AM, please contact night-coverage [email or web address] Password [email or web address] 03/08/2017, 2:27 PM

## 2017-03-08 NOTE — Progress Notes (Signed)
PT Note  Patient Details Name: [name] MRN: [id number] DOB: Feb 27, 1991      Finally was able to talk to the patient and the husband. Pt has been getting up to [facility] with nursing, however was able to walk to the end of the hall (about 80 feet) with husband and back. Appeared to be in good spirits. She stated she was going to try to walk with her husband 2 more times today. She discussed her thighs feeling weak for sit to stand and it pulls on her wound posterior with this movement. So we discussed thigh exercises that would not pull on her wound : SLR in bed , bridging in bed, and mini squats. Husband and patient agreed. No further needs at this time. Sign off.    Marella Bile 03/08/2017, 11:37 AM  Marella Bile, PT Pager: [phone] 03/08/2017

## 2017-03-09 ENCOUNTER — Inpatient Hospital Stay (HOSPITAL_COMMUNITY): Payer: 59

## 2017-03-09 DIAGNOSIS — M7989 Other specified soft tissue disorders: Secondary | ICD-10-CM

## 2017-03-09 LAB — CBC
HCT: 25.2 % — ABNORMAL LOW (ref 36.0–46.0)
Hemoglobin: 8.2 g/dL — ABNORMAL LOW (ref 12.0–15.0)
MCH: 30.6 pg (ref 26.0–34.0)
MCHC: 32.5 g/dL (ref 30.0–36.0)
MCV: 94 fL (ref 78.0–100.0)
Platelets: 383 10*3/uL (ref 150–400)
RBC: 2.68 MIL/uL — ABNORMAL LOW (ref 3.87–5.11)
RDW: 14.3 % (ref 11.5–15.5)
WBC: 5.2 10*3/uL (ref 4.0–10.5)

## 2017-03-09 LAB — GLUCOSE, CAPILLARY
Glucose-Capillary: 183 mg/dL — ABNORMAL HIGH (ref 65–99)
Glucose-Capillary: 82 mg/dL (ref 65–99)
Glucose-Capillary: 155 mg/dL — ABNORMAL HIGH (ref 65–99)
Glucose-Capillary: 78 mg/dL (ref 65–99)
Glucose-Capillary: 122 mg/dL — ABNORMAL HIGH (ref 65–99)
Glucose-Capillary: 58 mg/dL — ABNORMAL LOW (ref 65–99)
Glucose-Capillary: 150 mg/dL — ABNORMAL HIGH (ref 65–99)

## 2017-03-09 LAB — BASIC METABOLIC PANEL
Anion gap: 3 — ABNORMAL LOW (ref 5–15)
BUN: 9 mg/dL (ref 6–20)
CO2: 33 mmol/L — ABNORMAL HIGH (ref 22–32)
Calcium: 8.1 mg/dL — ABNORMAL LOW (ref 8.9–10.3)
Chloride: 104 mmol/L (ref 101–111)
Creatinine, Ser: 0.35 mg/dL — ABNORMAL LOW (ref 0.44–1.00)
GFR calc Af Amer: >60 mL/min (ref >60)
GFR calc non Af Amer: >60 mL/min (ref >60)
Glucose, Bld: 151 mg/dL — ABNORMAL HIGH (ref 65–99)
Potassium: 3.9 mmol/L (ref 3.5–5.1)
Sodium: 140 mmol/L (ref 135–145)

## 2017-03-09 MED ORDER — MORPHINE SULFATE (PF) 4 MG/ML IV SOLN
1.0000 mg | INTRAVENOUS | Status: DC | PRN
  Administered 2017-03-09 – 2017-03-14 (×16): 1 mg via INTRAVENOUS
  Filled 2017-03-09 (×16): qty 1

## 2017-03-09 NOTE — Progress Notes (Signed)
**Note De-Identified vi Obfusction** PROGRESS NOTE    [name]  HWE:[id number] DOB: 1990/10/12 DOA: 02/26/2017 PCP: Willey Blde, MD    Brief Nrrtive: [name] is  26 y.o. femle with medicl history significnt of type 1 DM with h/o remote DKA; HTN; nd gstropresis presenting with AMS.  Tody, her husbnd went to work erly this AM.  They work t the [employer] f[employer] nd she ws due to come in lter.  She clled him bout 11:15 nd sked him to come home becuse she ws "hving  pnic ttck."  She specificlly wnted him to help her.  She finlly took  np for  couple of hours.  She soiled herself nd he decided to tke her to the doctor.  She ws somewht incoherent with grbled speech nd hllucintions.  For exmple, she would try to sy the word for bthroom nd would forget the word, tht kind of thing.  She soiled herself gin.  He thought it might be  low blood sugr nd insted it ws high nd her temperture ws low.  She hs been hving dirrhe since lst night - woke overnight to go to the bthroom severl times.  She does hve soreness nd rsh on her buttocks.  She lso complined of  boil on her bottom lst week; he thought it ws better but it ws noticeble tody.   She hs hd DKA, but the lst time ws  few yers go.  No sick contcts.  They work for [employer] nd she hs been complining of being extr tired but they thought it ws due to working  lot of overtime.  She did et/drink normlly yesterdy.  She drnk some wter when her husbnd got home tody.  She complined of n/v to him but he did not witness this.  Assessment & Pln:   Principl Problem:   MDD (mjor depressive disorder), recurrent episode, mild (HCC) Active Problems:   Dibetes type 1, uncontrolled (HCC)   Protein-clorie mlnutrition, severe ([place])   DKA (dibetic ketocidosis) (HCC)   Dirrhe   Lower GI bleed   DKA with metbolic encephlopthy nd  severe cidosis venous blood gs pH 6.8 on presenttion:  -  Resolved - Pt with elevted BG's, will restrt lntus, continue SSI (uptitrte s pproprite, she's on lntus 15 units BID nd 8 units lispro TID with SSI  Poorly controlled type 1 dibetes -H/o Mediction noncomplince -Ptient reports not ble to fford insulin, she reports one hlf brnd insulin mke her feel bloted. -1c 12.2 -Dibetes eductor nd cse mnger consulted. -BG's improved with lntus 8 units dily with 2 units sprt with mels  Hypoglycemi event   ? due to inconsistent orl intke (noted 12/3)  Continue djust insulin dose CTM  periRectl bscess  -CT imging with complex fluids collection -s/p debridement in OR on 12/2,  - surgicl culture + Klebsiell (holding for nerobes),  zosyn d/ced, chnged to ncef, with flgyl to cover nerobes, Chnge to orl bx, pln to tret for totl of 14 dys. Lst dose of bx on 12/14. -will follow generl surgery recommendtions  1/4 Blood cx with lctobcillus:   Suspect this mybe contminnt (only 1 of 4 bcx with this result).  Discussed with ID tody.  Will hve 14 dy course of flgyl, regrdless.   Acute on chronic nemi -Hemoglobin 6.4 on dmission, sttus post PRBC trnsfusionx1 on November 30, prbc x1 on 12/4 -B12 unremrkble, folte slightly low, Strt folic cid supplement -trnsfuse prn to keep hgb >7. -FOBT+, Cse discussed with **Note De-Identified vi Obfusction** GI Dr Benson Norwy (difficult to sy significnce with wound)  Chronic dirrhe//weight loss with nemi/mlnutrition -Report followed by GI Dr. Collene Mres -Ct b Crron Curie + bscess nd scites but no bowel wll thickening -GI ordered c diff which showed ntigen positive, toxin negtive. GI stopped orl vnc given posible improvement with creon.   - pprecite GI recs, strted creon 12/5.  Strted lomotil 12/7.  My consider FFS if no improvement.   - -replce rectl tube  Moisture ssocited skin dmge round nl re  Likely from chronic dirrhe Wound cre consulted  Hypoklemi  /hypomgnesemi Replete k/mg  Mlnutrition. moderte: CT b/pel" Moderte volume scites with diffuse soft tissue nd body wll edem." Albumin 1.4 Nutrition consulted  Bilterl lower extremity edem  RUE edem: likely third spcing Will check RUE Kore. Venous doppler no DVT. D/c ivf Nutrition supplement.  Anxiety/ depression: Psych input pprecited, recommend celex 60m dily for  few dys, increse to 248mdily if ble to tolerte.   DVT prophylxis: SCD Code Sttus: full  Fmily Communiction: husbnd t bedside Disposition Pln: pending improvement in pin nd dirrhe   Consultnts:   Surgery nd GI  Procedures: (Don't include imging studies which cn be uto populted. Include things tht cnnot be uto populted i.e. Echo, Crotid nd venous dopplers, Foley, Bipp, HD, tubes/drins, wound vc, centrl lines etc)  Perirectl bscess debridement  Antimicrobils: (specify strt nd plnned stop dte. Auto populted tbles re spce occupying nd do not give end dtes)  Zosyn 12/1-12/4  Ancef/flgyl 12/4 - 12/7, orl keflex nd flgyl from 12/7  Vncomycin orl 12/3- 12/4   Subjective: Stools better. Still pin. Concerned bout wekness, but discussed pln with PT yesterdy.  Objective: Vitls:   03/08/17 0511 03/08/17 1343 03/08/17 2137 03/09/17 0539  BP: 116/76 109/73 125/82 102/66  Pulse: 87 89 87 89  Resp: 18 18 18 15   Temp: 97.8 F (36.6 C) 98 F (36.7 C) 97.8 F (36.6 C) 97.9 F (36.6 C)  TempSrc: Orl Orl Orl Orl  SpO2: 96% 95% 98% 95%  Weight:      Height:        Intke/Output Summry (Lst 24 hours) t 03/09/2017 0928 Lst dt filed t 03/08/2017 1854 Gross per 24 hour  Intke 600 ml  Output -  Net 600 ml   Filed Weights   02/26/17 1708  Weight: 52.2 kg (115 lb)    Exmintion:  Generl: No cute distress. Crdiovsculr: Hert sounds show  regulr rte, nd rhythm. No gllops or rubs. No murmurs. No JVD. Lungs:  Cler to usculttion bilterlly with good ir movement. No rles, rhonchi or wheezes. Abdomen: Soft, nontender, nondistended with norml ctive bowel sounds. No msses. No heptosplenomegly. Neurologicl: Alert nd oriented 3. Moves ll extremities 4 with equl strength. Crnil nerves II through XII grossly intct. Skin: Wrm nd dry. No rshes or lesions. Extremities: Bilterl LE edem, RUE edem Psychitric: Mood nd ffect re norml. Insight nd judgment re pproprite.   Dt Reviewed: I hve personlly reviewed following lbs nd imging studies  CBC: Recent Lbs  Lb 03/04/17 0631 03/05/17 0539 03/06/17 0537 03/08/17 1147 03/09/17 0527  WBC 7.7 7.7 6.3 6.4 5.2  NEUTROABS 4.9  --   --   --   --   HGB 8.7* 8.3* 8.1* 8.4* 8.2*  HCT 26.5* 25.0* 24.4* 25.7* 25.2*  MCV 93.3 93.6 93.8 94.1 94.0  PLT 244 300 317 371 [id number] Bsic Metbolic Pnel: Recent Lbs  Lb 03/03/17 0324 03/04/17 0631 03/05/17 0546 03/06/17 0537  03/07/17 0613 03/08/17 0433 03/09/17 0527  N 136 135 136 140 139 139 140  K 3.9 4.1 4.0 3.7 3.9 3.5 3.9  CL 107 103 103 105 104 102 104  CO2 26 27 28 30 30 31  33*  GLUCOSE 234* 338* 188* 163* 169* 118* 151*  BUN 7 10 7 8 9 9 9   CRETININE 0.43* 0.42* <0.30* 0.33* 0.37* 0.34* 0.35*  CLCIUM 7.5* 7.8* 7.8* 7.7* 7.7* 7.7* 8.1*  MG 2.0 1.8  --   --  1.6* 1.9  --   PHOS  --   --   --   --   --  3.6  --    GFR: Estimated Creatinine Clearance: 87 mL/min () (by C-G formula based on SCr of 0.35 mg/dL (L)). Liver Function Tests: Recent Labs  Lab 03/05/17 0546 03/08/17 0433  ST  --  17  LT  --  8*  LKPHOS  --  78  BILITOT  --  0.2*  PROT  --  5.9*  LBUMIN 1.3* 1.3*   No results for input(s): LIPSE, MYLSE in the last 168 hours. No results for input(s): MMONI in the last 168 hours. Coagulation Profile: No results for input(s): INR, PROTIME in the last 168 hours. Cardiac Enzymes: No results for input(s): CKTOTL, CKMB, CKMBINDEX,  TROPONINI in the last 168 hours. BNP (last 3 results) No results for input(s): PROBNP in the last 8760 hours. Hb1C: No results for input(s): HGB1C in the last 72 hours. CBG: Recent Labs  Lab 03/08/17 1137 03/08/17 1627 03/08/17 1727 03/08/17 2135 03/09/17 0733  GLUCP 104* 58* 114* 82 155*   Lipid Profile: No results for input(s): CHOL, HDL, LDLCLC, TRIG, CHOLHDL, LDLDIRECT in the last 72 hours. Thyroid Function Tests: No results for input(s): TSH, T4TOTL, FREET4, T3FREE, THYROIDB in the last 72 hours. nemia Panel: No results for input(s): VITMINB12, FOLTE, FERRITIN, TIBC, IRON, RETICCTPCT in the last 72 hours. Sepsis Labs: No results for input(s): PROCLCITON, LTICCIDVEN in the last 168 hours.  Recent Results (from the past 240 hour(s))  erobic/naerobic Culture (surgical/deep wound)     Status: None   Collection Time: 03/01/17 11:00 M  Result Value Ref Range Status   Specimen Description BSCESS  Final   Special Requests NONE  Final   Gram Stain   Final    BUNDNT WBC PRESENT, PREDOMINNTLY PMN FEW GRM POSITIVE RODS RRE GRM POSITIVE COCCI IN PIRS RRE GRM NEGTIVE RODS    Culture   Final    FEW KLEBSIELL PNEUMONIE MODERTE BCTEROIDES FRGILIS BET LCTMSE POSITIVE Performed at [facility], [place] [address], [place], [place] [address]    Report Status 03/07/2017 FINL  Final   Organism ID, Bacteria KLEBSIELL PNEUMONIE  Final      Susceptibility   Klebsiella pneumoniae - MIC*    MPICILLIN >=32 RESISTNT Resistant     CEFZOLIN <=4 SENSITIVE Sensitive     CEFEPIME <=1 SENSITIVE Sensitive     CEFTZIDIME <=1 SENSITIVE Sensitive     CEFTRIXONE <=1 SENSITIVE Sensitive     CIPROFLOXCIN <=0.25 SENSITIVE Sensitive     GENTMICIN <=1 SENSITIVE Sensitive     IMIPENEM <=0.25 SENSITIVE Sensitive     TRIMETH/SULF <=20 SENSITIVE Sensitive     MPICILLIN/SULBCTM >=32 RESISTNT Resistant     PIP/TZO 16 SENSITIVE Sensitive     Extended  ESBL NEGTIVE Sensitive     * FEW KLEBSIELL PNEUMONIE  Gastrointestinal Panel by PCR , Stool     Status: None   Collection  Time: 03/03/17  1:44 M  Result Value Ref Range Status   Campylobacter species NOT DETECTED NOT DETECTED Final   Plesimonas shigelloides NOT DETECTED NOT DETECTED Final   Salmonella species NOT DETECTED NOT DETECTED Final   Yersinia enterocolitica NOT DETECTED NOT DETECTED Final   Vibrio species NOT DETECTED NOT DETECTED Final   Vibrio cholerae NOT DETECTED NOT DETECTED Final   Enteroaggregative E coli (EEC) NOT DETECTED NOT DETECTED Final   Enteropathogenic E coli (EPEC) NOT DETECTED NOT DETECTED Final   Enterotoxigenic E coli (ETEC) NOT DETECTED NOT DETECTED Final   Shiga like toxin producing E coli (STEC) NOT DETECTED NOT DETECTED Final   Shigella/Enteroinvasive E coli (EIEC) NOT DETECTED NOT DETECTED Final   Cryptosporidium NOT DETECTED NOT DETECTED Final   Cyclospora cayetanensis NOT DETECTED NOT DETECTED Final   Entamoeba histolytica NOT DETECTED NOT DETECTED Final   Giardia lamblia NOT DETECTED NOT DETECTED Final   denovirus F40/41 NOT DETECTED NOT DETECTED Final   strovirus NOT DETECTED NOT DETECTED Final   Norovirus GI/GII NOT DETECTED NOT DETECTED Final   Rotavirus  NOT DETECTED NOT DETECTED Final   Sapovirus (I, II, IV, and V) NOT DETECTED NOT DETECTED Final  Clostridium Difficile by PCR     Status: None   Collection Time: 03/04/17  9:00 M  Result Value Ref Range Status   Toxigenic C Difficile by pcr NEGTIVE NEGTIVE Final    Comment: Patient is colonized with non toxigenic C. difficile. May not need treatment unless significant symptoms are present. Performed at [facility], [place] [address] [place], [place] [address]          Radiology Studies: No results found.      Scheduled Meds: . acetaminophen  1,000 mg Oral TID  . cephLEXin  500 mg Oral Q6H  . citalopram  20 mg Oral Daily  . diphenoxylate-atropine  2 tablet Oral  QID  . feeding supplement (GLUCERN SHKE)  237 mL Oral BID BM  . ferrous sulfate  325 mg Oral BID WC  . folic acid  1 mg Oral Daily  . gabapentin  300 mg Oral BID  . insulin aspart  0-9 Units Subcutaneous TID WC  . insulin aspart  2 Units Subcutaneous TID WC  . insulin glargine  8 Units Subcutaneous Daily  . lipase/protease/amylase  36,000 Units Oral TID WC  . loperamide  2 mg Oral TID C  . metroNIDZOLE  500 mg Oral Q8H  . protein supplement shake  11 oz Oral Q24H   Continuous Infusions: . methocarbamol (ROBXIN)  IV       LOS: 11 days    Time spent: over 20 minutes    Fayrene Helper, MD PhD [facility] Pager [phone]  If 7PM-7M, please contact night-coverage [email or web address] Password [email or web address] 03/09/2017, 9:28 M

## 2017-03-09 NOTE — Progress Notes (Signed)
Right upper extremity venous duplex completed. No evidence of DVT or superficial thrombosis. Toluwani Ruder,RVS 03/09/17, 9:20 AM

## 2017-03-10 ENCOUNTER — Encounter (HOSPITAL_COMMUNITY)
Admission: EM | Disposition: A | Discharge status: Home / Self Care | Payer: Self-pay | Source: Home / Self Care | Attending: Family Medicine

## 2017-03-10 ENCOUNTER — Inpatient Hospital Stay (HOSPITAL_COMMUNITY): Payer: 59 | Admitting: Anesthesiology

## 2017-03-10 ENCOUNTER — Encounter (HOSPITAL_COMMUNITY): Payer: Self-pay | Admitting: *Deleted

## 2017-03-10 HISTORY — PX: FLEXIBLE SIGMOIDOSCOPY: SHX5431

## 2017-03-10 LAB — CBC
HCT: 25.4 % — ABNORMAL LOW (ref 36.0–46.0)
Hemoglobin: 8 g/dL — ABNORMAL LOW (ref 12.0–15.0)
MCH: 30.1 pg (ref 26.0–34.0)
MCHC: 31.5 g/dL (ref 30.0–36.0)
MCV: 95.5 fL (ref 78.0–100.0)
Platelets: 397 10*3/uL (ref 150–400)
RBC: 2.66 MIL/uL — ABNORMAL LOW (ref 3.87–5.11)
RDW: 14.4 % (ref 11.5–15.5)
WBC: 4.6 10*3/uL (ref 4.0–10.5)

## 2017-03-10 LAB — GLUCOSE, CAPILLARY
Glucose-Capillary: 189 mg/dL — ABNORMAL HIGH (ref 65–99)
Glucose-Capillary: 70 mg/dL (ref 65–99)
Glucose-Capillary: 39 mg/dL — CL (ref 65–99)
Glucose-Capillary: 80 mg/dL (ref 65–99)
Glucose-Capillary: 70 mg/dL (ref 65–99)
Glucose-Capillary: 153 mg/dL — ABNORMAL HIGH (ref 65–99)

## 2017-03-10 LAB — BASIC METABOLIC PANEL
Anion gap: 5 (ref 5–15)
BUN: 10 mg/dL (ref 6–20)
CO2: 32 mmol/L (ref 22–32)
Calcium: 8 mg/dL — ABNORMAL LOW (ref 8.9–10.3)
Chloride: 106 mmol/L (ref 101–111)
Creatinine, Ser: 0.49 mg/dL (ref 0.44–1.00)
GFR calc Af Amer: >60 mL/min (ref >60)
GFR calc non Af Amer: >60 mL/min (ref >60)
Glucose, Bld: 205 mg/dL — ABNORMAL HIGH (ref 65–99)
Potassium: 4 mmol/L (ref 3.5–5.1)
Sodium: 143 mmol/L (ref 135–145)

## 2017-03-10 SURGERY — SIGMOIDOSCOPY, FLEXIBLE
Anesthesia: Monitor Anesthesia Care

## 2017-03-10 MED ORDER — LIDOCAINE VISCOUS 2 % MT SOLN
OROMUCOSAL | Status: AC
  Filled 2017-03-10: qty 15

## 2017-03-10 MED ORDER — LIDOCAINE HCL 2 % EX GEL
1.0000 "application " | Freq: Once | CUTANEOUS | Status: DC
  Filled 2017-03-10: qty 11

## 2017-03-10 MED ORDER — DEXTROSE 50 % IV SOLN
INTRAVENOUS | Status: AC
  Filled 2017-03-10: qty 50

## 2017-03-10 MED ORDER — LIDOCAINE VISCOUS 2 % MT SOLN
15.0000 mL | Freq: Once | OROMUCOSAL | Status: AC
  Administered 2017-03-10: 15 mL via OROMUCOSAL

## 2017-03-10 MED ORDER — LIDOCAINE VISCOUS 2 % MT SOLN: 15.0000 mL | Freq: Once | OROMUCOSAL | Status: DC

## 2017-03-10 MED ORDER — DEXTROSE 50 % IV SOLN
INTRAVENOUS | Status: DC | PRN
  Administered 2017-03-10: 25 mL via INTRAVENOUS

## 2017-03-10 MED ORDER — CHOLESTYRAMINE LIGHT 4 G PO PACK
4.0000 g | PACK | ORAL | Status: DC
  Administered 2017-03-10 – 2017-03-14 (×7): 4 g via ORAL
  Filled 2017-03-10 (×13): qty 1

## 2017-03-10 NOTE — Progress Notes (Signed)
Pt's CBG was 39. Notified Dr. Elnoria Howard. 1/2 amp of D50 given. CBG recheck was 80.

## 2017-03-10 NOTE — Op Note (Signed)
[facility] Patient Name: Carmaleta Youngers Procedure Date: 03/10/2017 MRN: [id number] Attending MD: Carol Ada , MD Date of Birth: [birth year] CSN: [id number] Age: 26 Admit Type: Inpatient Procedure:                Flexible Sigmoidoscopy Indications:              Diarrhea Providers:                Carol Ada, MD, Angus Seller, Cletis Athens,                            Technician Referring MD:              Medicines:                None Complications:            No immediate complications. Estimated Blood Loss:     Estimated blood loss was minimal. Procedure:                Pre-Anesthesia Assessment:                           - Prior to the procedure, a History and Physical                            was performed, and patient medications and                            allergies were reviewed. The patient's tolerance of                            previous anesthesia was also reviewed. The risks                            and benefits of the procedure and the sedation                            options and risks were discussed with the patient.                            All questions were answered, and informed consent                            was obtained. Prior Anticoagulants: The patient has                            taken no previous anticoagulant or antiplatelet                            agents. ASA Grade Assessment: III - A patient with                            severe systemic disease. After reviewing the risks                            and benefits, the patient was  deemed in                            satisfactory condition to undergo the procedure.                           After obtaining informed consent, the scope was                            passed under direct vision. The [id number] ([id number])                            scope was introduced through the anus and advanced                            to the the sigmoid colon. The flexible               sigmoidoscopy was accomplished without difficulty.                            The patient tolerated the procedure well. The                            quality of the bowel preparation was adequate. Scope In: Scope Out: Findings:      The entire examined colon appeared normal. Biopsies were taken with a       cold forceps for histology.      This was an unprepped and unsedated FFS. The patient was provided orange       juice and crackers for hypoglycemia 1 hour before the procedure. She       tolerated the procedure well. There was thick liquid stool adherent to       the mucosa. No form was identified. The visualized mucosa appeared       normal, but biopsies were obtained. No overt evidence of inflammation,       ulcerations, or erosions. Impression:               - The entire examined colon is normal. Biopsied. Moderate Sedation:      N/A- Per Anesthesia Care Recommendation:           - Return patient to hospital ward for ongoing care.                           - Resume previous diet.                           - Await pathology results.                           - Trial of cholesytramine. Procedure Code(s):        --- Professional ---                           [phone], Sigmoidoscopy, flexible; with biopsy, single                            or multiple Diagnosis Code(s):        ---  Professional ---                           R19.7, Diarrhea, unspecified CPT copyright 2016 [employer]. All rights reserved. The codes documented in this report are preliminary and upon coder review may  be revised to meet current compliance requirements. Carol Ada, MD Carol Ada, MD 03/10/2017 3:13:03 PM This report has been signed electronically. Number of Addenda: 0

## 2017-03-10 NOTE — Anesthesia Preprocedure Evaluation (Deleted)
Anesthesia Evaluation  Patient identified by MRN, date of birth, ID band Patient awake    Reviewed: Allergy & Precautions, NPO status , Patient's Chart, lab work & pertinent test results  History of Anesthesia Complications Negative for: history of anesthetic complications  Airway Mallampati: I  TM Distance: >3 FB Neck ROM: Full    Dental  (+) Teeth Intact   Pulmonary neg pulmonary ROS,    breath sounds clear to auscultation       Cardiovascular hypertension,  Rhythm:Regular     Neuro/Psych PSYCHIATRIC DISORDERS negative neurological ROS     GI/Hepatic Neg liver ROS, Persistent diarrhea (c diff precautions)   Endo/Other  diabetes, Poorly Controlled  Renal/GU negative Renal ROS     Musculoskeletal  (+) Fibromyalgia -  Abdominal   Peds  Hematology negative hematology ROS (+)   Anesthesia Other Findings Poor nutritional status with low albumin, NPO 0400, denies N/V currently. Nasal ring  Reproductive/Obstetrics                             Anesthesia Physical  Anesthesia Plan  ASA: II  Anesthesia Plan: MAC   Post-op Pain Management:    Induction: Intravenous  PONV Risk Score and Plan: 2 and Ondansetron and Dexamethasone  Airway Management Planned: Mask and Natural Airway  Additional Equipment: None  Intra-op Plan:   Post-operative Plan: Extubation in OR  Informed Consent: I have reviewed the patients History and Physical, chart, labs and discussed the procedure including the risks, benefits and alternatives for the proposed anesthesia with the patient or authorized representative who has indicated his/her understanding and acceptance.   Dental advisory given  Plan Discussed with: CRNA and Surgeon  Anesthesia Plan Comments:         Anesthesia Quick Evaluation

## 2017-03-10 NOTE — Progress Notes (Signed)
  Date:  March 10, 2017 Chart reviewed for concurrent status and case management needs.  Will continue to follow patient progress.  Discharge Planning: following for needs  Expected discharge date: March 13, 2017  Siddhi Dornbush, BSN, RN3, CCM   [phone]  

## 2017-03-10 NOTE — Progress Notes (Signed)
Pts dressing changed after soiling

## 2017-03-10 NOTE — Progress Notes (Signed)
PROGRESS NOTE    [name]  ZMO:[id number] DOB: 02-27-1991 DOA: 02/26/2017 PCP: Andi Devon, MD    Brief Narrative: [name] is [name] 26 y.o. female with medical history significant of type 1 DM with h/o remote DKA; HTN; and gastroparesis presenting with AMS.  Today, her husband went to work early this AM.  They work at the same facility and she was due to come in later.  She called him about 11:15 and asked him to come home because she was "having [name] panic attack."  She specifically wanted him to help her.  She finally took Mace Weinberg nap for [name] couple of hours.  She soiled herself and he decided to take her to the doctor.  She was somewhat incoherent with garbled speech and hallucinations.  For example, she would try to say the word for bathroom and would forget the word, that kind of thing.  She soiled herself again.  He thought it might be [name] low blood sugar and instead it was high and her temperature was low.  She has been having diarrhea since last night - awoke overnight to go to the bathroom several times.  She does have soreness and rash on her buttocks.  She also complained of Azlan Hanway boil on her bottom last week; he thought it was better but it was noticeable today.   She has had DKA, but the last time was Carine Nordgren few years ago.  No sick contacts.  They work for [employer] and she has been complaining of being extra tired but they thought it was due to working [employer] lot of overtime.  She did eat/drink normally yesterday.  She drank some water when her husband got home today.  She complained of n/v to him but he did not witness this.  Assessment & Plan:   Principal Problem:   MDD (major depressive disorder), recurrent episode, mild (HCC) Active Problems:   Diabetes type 1, uncontrolled (HCC)   Protein-calorie malnutrition, severe (HCC)   DKA (diabetic ketoacidosis) (HCC)   Diarrhea   Lower GI bleed  Chronic diarrhea//weight loss with anemia/malnutrition -followed by GI Dr. Loreta Ave -Ct ab [name] + abscess and  ascites but no bowel wall thickening -GI ordered c diff which showed antigen positive, toxin negative. Trial of vanc, but d/c'd.  - Now on creon, lomotil, imodium.  Starting cholestyramine today.  - s/p flex sig 12/11 with biopsies taken  periRectal abscess  -CT imaging with complex fluids collection -s/p debridement in OR on 12/2,  - surgical culture + Klebsiella (holding for anaerobes),  zosyn d/ced, changed to ancef, with flagyl to cover anaerobes, Change to oral abx, plan to treat for total of 14 days. Last dose of abx on 12/14. -will follow general surgery recommendations  DKA with metabolic encephalopathy and  severe acidosis venous blood gas pH 6.8 on presentation:  - Resolved  Poorly controlled type 1 diabetes -H/o Medication noncompliance -Patient reports not able to afford insulin, she reports one half brand insulin make her feel bloated. -a1c 12.2 -Diabetes educator and case manager consulted. -BG's improved with lantus 8 units daily with 2 units aspart with meals (at home she's on 15 units BID and 8 units lispro TID with SSI)  Hypoglycemia event   Noted again this morning with procedure, likely given NPO status Continue to monitor on current regimen as she's no longer NPO ? due to inconsistent oral intake (noted 12/3)  Continue adjust insulin dose CTM  1/4 Blood cx with lactobacillus:  Suspect this maybe contaminant (only 1 of 4 bcx with this result).  Discussed with ID.  Will have 14 day course of flagyl for perirectal wound as above, regardless.   Acute on chronic anemia -Hemoglobin 6.4 on admission, status post PRBC transfusionx1 on November 30, prbc x1 on 12/4 -B12 unremarkable, folate slightly low, Start folic acid supplement -transfuse prn to keep hgb >7. -FOBT+ (she had flex sig today, but this was also in setting of her perirectal abscess so not sure accuracy)  Moisture associated skin damage around anal area  Likely from chronic diarrhea Wound care  consulted  Hypokalemia /hypomagnesemia Replete k/mag  Malnutrition. moderate: CT ab/pel" Moderate volume ascites with diffuse soft tissue and body wall edema." Albumin 1.4 Nutrition consulted  Bilateral lower extremity edema  RUE edema: likely third spacing RUE US with no evidence of DVT Venous doppler no DVT. D/c ivf Nutrition supplement.  Anxiety/ depression: Celexa 20 mg daily  DVT prophylaxis: SCD Code Status: full  Family Communication: husband at bedside Disposition Plan: pending improvement in pain and diarrhea   Consultants:   Surgery and GI and PCCM  Procedures:   Perirectal abscess debridement 12/2  Antimicrobials:   Zosyn 12/1-12/4  Ancef/flagyl 12/4 - 12/7, oral keflex and flagyl from 12/7-12/  Vancomycin oral 12/3- 12/6   Subjective: Stools better. Still pain. Concerned about weakness, but discussed plan with PT yesterday.  Objective: Vitals:   03/10/17 1505 03/10/17 1510 03/10/17 1515 03/10/17 1520  BP: (!) 145/99 (!) 151/101 (!) 157/104   Pulse: (!) 102 (!) 104 (!) 104 99  Resp:   14   Temp:      TempSrc:      SpO2: 97% (!) 89% 90% (!) 86%  Weight:      Height:       No intake or output data in the 24 hours ending 03/10/17 1832 Filed Weights   02/26/17 1708  Weight: 52.2 kg (115 lb)    Examination:  General: No acute distress. Cardiovascular: Heart sounds show [place] regular rate, and rhythm. No gallops or rubs. No murmurs. No JVD. Lungs: Clear to auscultation bilaterally with good air movement. No rales, rhonchi or wheezes. Abdomen: Soft, nontender, nondistended with normal active bowel sounds. No masses. No hepatosplenomegaly. Neurological: Alert and oriented 3. Moves all extremities 4 with equal strength. Cranial nerves II through XII grossly intact. Skin: Warm and dry. No rashes or lesions. Extremities: Bilateral LE edema, RUE edema Psychiatric: Mood and affect are normal. Insight and judgment are appropriate.   Data  Reviewed: I have personally reviewed following labs and imaging studies  CBC: Recent Labs  Lab 03/04/17 0631 03/05/17 0539 03/06/17 0537 03/08/17 1147 03/09/17 0527 03/10/17 0532  WBC 7.7 7.7 6.3 6.4 5.2 4.6  NEUTROABS 4.9  --   --   --   --   --   HGB 8.7* 8.3* 8.1* 8.4* 8.2* 8.0*  HCT 26.5* 25.0* 24.4* 25.7* 25.2* 25.4*  MCV 93.3 93.6 93.8 94.1 94.0 95.5  PLT 244 300 317 371 383 397   Basic Metabolic Panel: Recent Labs  Lab 03/04/17 0631  03/06/17 0537 03/07/17 0613 03/08/17 0433 03/09/17 0527 03/10/17 0532  NA 135   < > 140 139 139 140 143  K 4.1   < > 3.7 3.9 3.5 3.9 4.0  CL 103   < > 105 104 102 104 106  CO2 27   < > 30 30 31  33* 32  GLUCOSE 338*   < > 163* 169* 118*  151* 205*  BUN 10   < > 8 9 9 9 10   CREATININE 0.42*   < > 0.33* 0.37* 0.34* 0.35* 0.49  CALCIUM 7.8*   < > 7.7* 7.7* 7.7* 8.1* 8.0*  MG 1.8  --   --  1.6* 1.9  --   --   PHOS  --   --   --   --  3.6  --   --    < > = values in this interval not displayed.   GFR: Estimated Creatinine Clearance: 87 mL/min (by C-G formula based on SCr of 0.49 mg/dL). Liver Function Tests: Recent Labs  Lab 03/05/17 0546 03/08/17 0433  AST  --  17  ALT  --  8*  ALKPHOS  --  78  BILITOT  --  0.2*  PROT  --  5.9*  ALBUMIN 1.3* 1.3*   No results for input(s): LIPASE, AMYLASE in the last 168 hours. No results for input(s): AMMONIA in the last 168 hours. Coagulation Profile: No results for input(s): INR, PROTIME in the last 168 hours. Cardiac Enzymes: No results for input(s): CKTOTAL, CKMB, CKMBINDEX, TROPONINI in the last 168 hours. BNP (last 3 results) No results for input(s): PROBNP in the last 8760 hours. HbA1C: No results for input(s): HGBA1C in the last 72 hours. CBG: Recent Labs  Lab 03/10/17 0742 03/10/17 1254 03/10/17 1509 03/10/17 1530 03/10/17 1714  GLUCAP 189* 70 39* 80 70   Lipid Profile: No results for input(s): CHOL, HDL, LDLCALC, TRIG, CHOLHDL, LDLDIRECT in the last 72 hours. Thyroid  Function Tests: No results for input(s): TSH, T4TOTAL, FREET4, T3FREE, THYROIDAB in the last 72 hours. Anemia Panel: No results for input(s): VITAMINB12, FOLATE, FERRITIN, TIBC, IRON, RETICCTPCT in the last 72 hours. Sepsis Labs: No results for input(s): PROCALCITON, LATICACIDVEN in the last 168 hours.  Recent Results (from the past 240 hour(s))  Aerobic/Anaerobic Culture (surgical/deep wound)     Status: None   Collection Time: 03/01/17 11:00 AM  Result Value Ref Range Status   Specimen Description ABSCESS  Final   Special Requests NONE  Final   Gram Stain   Final    ABUNDANT WBC PRESENT, PREDOMINANTLY PMN FEW GRAM POSITIVE RODS RARE GRAM POSITIVE COCCI IN PAIRS RARE GRAM NEGATIVE RODS    Culture   Final    FEW KLEBSIELLA PNEUMONIAE MODERATE BACTEROIDES FRAGILIS BETA LACTAMASE POSITIVE Performed at [facility], [address] [place], [place] [address]    Report Status 03/07/2017 FINAL  Final   Organism ID, Bacteria KLEBSIELLA PNEUMONIAE  Final      Susceptibility   Klebsiella pneumoniae - MIC*    AMPICILLIN >=32 RESISTANT Resistant     CEFAZOLIN <=4 SENSITIVE Sensitive     CEFEPIME <=1 SENSITIVE Sensitive     CEFTAZIDIME <=1 SENSITIVE Sensitive     CEFTRIAXONE <=1 SENSITIVE Sensitive     CIPROFLOXACIN <=0.25 SENSITIVE Sensitive     GENTAMICIN <=1 SENSITIVE Sensitive     IMIPENEM <=0.25 SENSITIVE Sensitive     TRIMETH/SULFA <=20 SENSITIVE Sensitive     AMPICILLIN/SULBACTAM >=32 RESISTANT Resistant     PIP/TAZO 16 SENSITIVE Sensitive     Extended ESBL NEGATIVE Sensitive     * FEW KLEBSIELLA PNEUMONIAE  Gastrointestinal Panel by PCR , Stool     Status: None   Collection Time: 03/03/17  1:44 AM  Result Value Ref Range Status   Campylobacter species NOT DETECTED NOT DETECTED Final   Plesimonas shigelloides NOT DETECTED NOT DETECTED Final  Salmonella species NOT DETECTED NOT DETECTED Final   Yersinia enterocolitica NOT DETECTED NOT DETECTED Final   Vibrio species  NOT DETECTED NOT DETECTED Final   Vibrio cholerae NOT DETECTED NOT DETECTED Final   Enteroaggregative E coli (EAEC) NOT DETECTED NOT DETECTED Final   Enteropathogenic E coli (EPEC) NOT DETECTED NOT DETECTED Final   Enterotoxigenic E coli (ETEC) NOT DETECTED NOT DETECTED Final   Shiga like toxin producing E coli (STEC) NOT DETECTED NOT DETECTED Final   Shigella/Enteroinvasive E coli (EIEC) NOT DETECTED NOT DETECTED Final   Cryptosporidium NOT DETECTED NOT DETECTED Final   Cyclospora cayetanensis NOT DETECTED NOT DETECTED Final   Entamoeba histolytica NOT DETECTED NOT DETECTED Final   Giardia lamblia NOT DETECTED NOT DETECTED Final   Adenovirus F40/41 NOT DETECTED NOT DETECTED Final   Astrovirus NOT DETECTED NOT DETECTED Final   Norovirus GI/GII NOT DETECTED NOT DETECTED Final   Rotavirus Mayo Owczarzak NOT DETECTED NOT DETECTED Final   Sapovirus (I, II, IV, and V) NOT DETECTED NOT DETECTED Final  Clostridium Difficile by PCR     Status: None   Collection Time: 03/04/17  9:00 AM  Result Value Ref Range Status   Toxigenic C Difficile by pcr NEGATIVE NEGATIVE Final    Comment: Patient is colonized with non toxigenic C. difficile. May not need treatment unless significant symptoms are present. Performed at [facility], [address], [place], [place] [address]          Radiology Studies: No results found.      Scheduled Meds: . acetaminophen  1,000 mg Oral TID  . cephALEXin  500 mg Oral Q6H  . cholestyramine light  4 g Oral 3 times per day  . citalopram  20 mg Oral Daily  . diphenoxylate-atropine  2 tablet Oral QID  . feeding supplement (GLUCERNA SHAKE)  237 mL Oral BID BM  . ferrous sulfate  325 mg Oral BID WC  . folic acid  1 mg Oral Daily  . gabapentin  300 mg Oral BID  . insulin aspart  0-9 Units Subcutaneous TID WC  . insulin aspart  2 Units Subcutaneous TID WC  . insulin glargine  8 Units Subcutaneous Daily  . lipase/protease/amylase  36,000 Units Oral TID WC  .  loperamide  2 mg Oral TID AC  . metroNIDAZOLE  500 mg Oral Q8H  . protein supplement shake  11 oz Oral Q24H   Continuous Infusions: . methocarbamol (ROBAXIN)  IV       LOS: 12 days    Time spent: over 20 minutes    Lacretia Nicks, MD PhD [facility] Pager [phone]  If 7PM-7AM, please contact night-coverage [email or web address] Password [facility] 03/10/2017, 6:32 PM

## 2017-03-11 ENCOUNTER — Inpatient Hospital Stay (HOSPITAL_COMMUNITY): Payer: 59

## 2017-03-11 LAB — BODY FLUID CELL COUNT WITH DIFFERENTIAL
Eos, Fluid: 0 %
Lymphs, Fluid: 41 %
Monocyte-Macrophage-Serous Fluid: 56 % (ref 50–90)
Neutrophil Count, Fluid: 3 % (ref 0–25)
Total Nucleated Cell Count, Fluid: 81 cu mm (ref 0–1000)

## 2017-03-11 LAB — PROTEIN, PLEURAL OR PERITONEAL FLUID: Total protein, fluid: <3 g/dL

## 2017-03-11 LAB — GLUCOSE, CAPILLARY
Glucose-Capillary: 339 mg/dL — ABNORMAL HIGH (ref 65–99)
Glucose-Capillary: 237 mg/dL — ABNORMAL HIGH (ref 65–99)
Glucose-Capillary: 55 mg/dL — ABNORMAL LOW (ref 65–99)
Glucose-Capillary: 53 mg/dL — ABNORMAL LOW (ref 65–99)
Glucose-Capillary: 96 mg/dL (ref 65–99)
Glucose-Capillary: 155 mg/dL — ABNORMAL HIGH (ref 65–99)

## 2017-03-11 LAB — ALBUMIN, PLEURAL OR PERITONEAL FLUID: Albumin, Fluid: <1 g/dL

## 2017-03-11 IMAGING — US US PARACENTESIS
1 series · 8 of 8 positions shown · non-contrast
Comparison: none

INDICATION: Patient with history of diarrhea, low albumin/malnutrition,
diabetes, ascites, perirectal fluid collection; request made for
diagnostic and therapeutic paracentesis.

[Series 1: us paracentesis · 0.26mm/px · 8 of 8 slices shown]
[im 1/8]
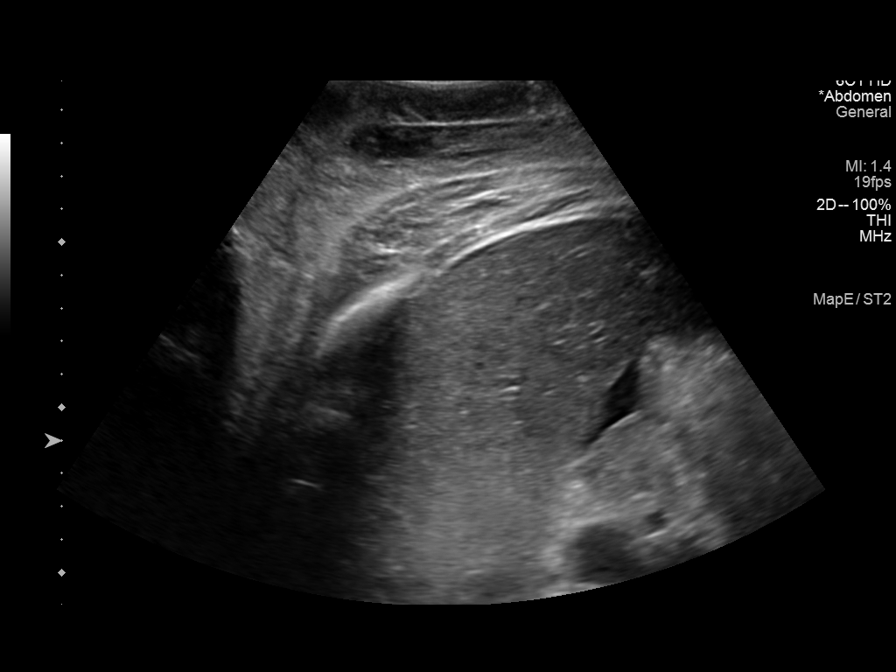
[im 2/8]
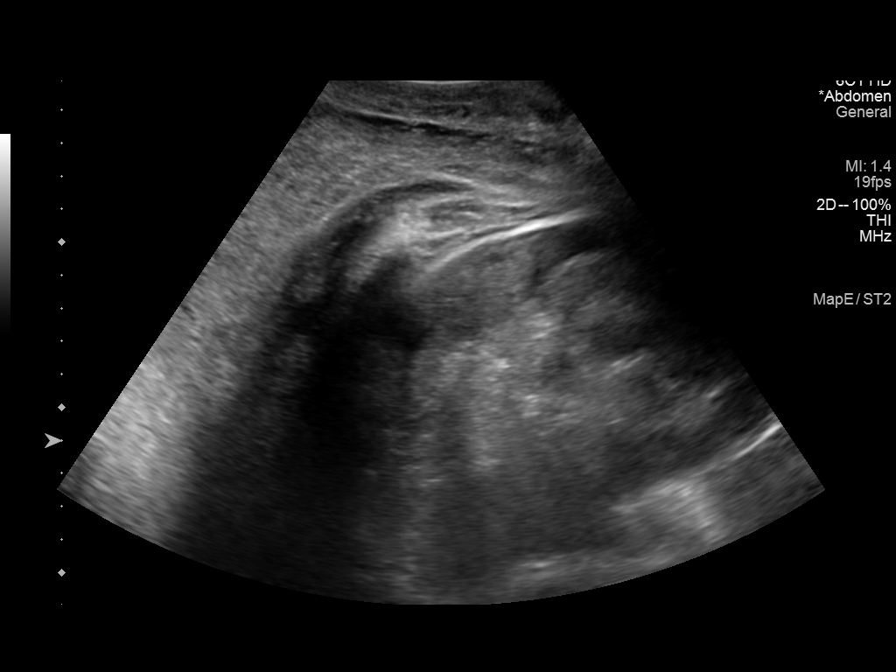
[im 3/8]
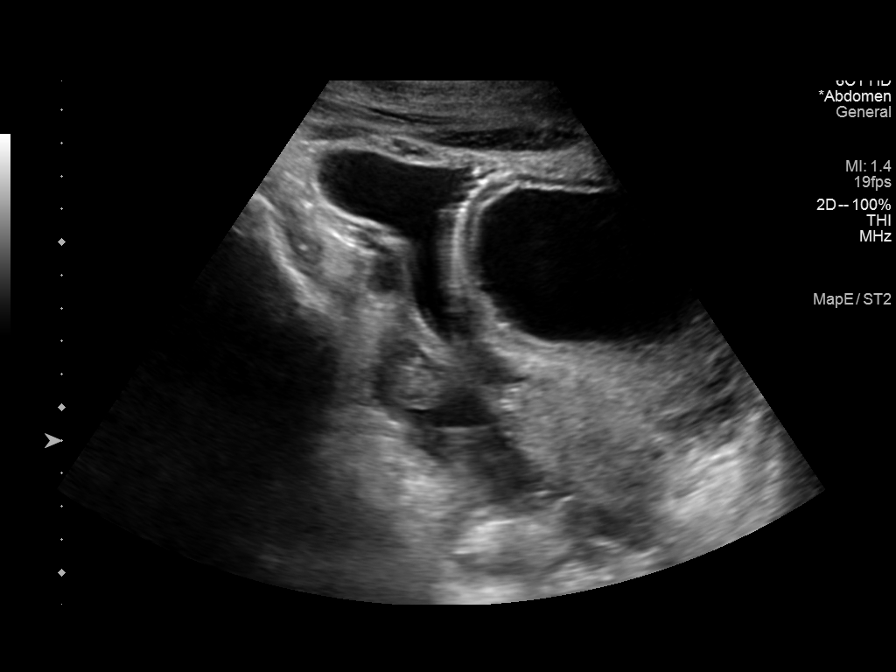
[im 4/8]
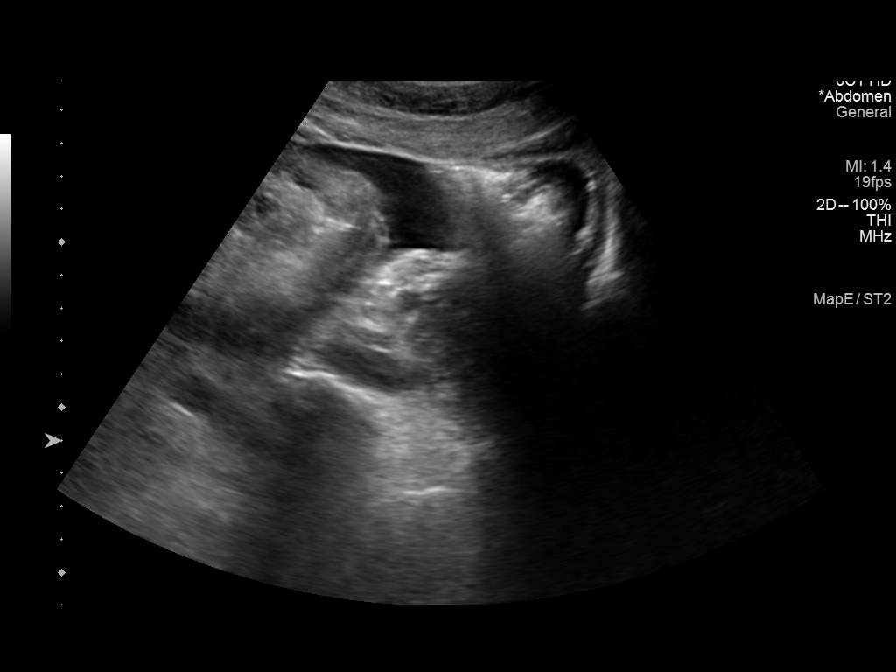
[im 5/8]
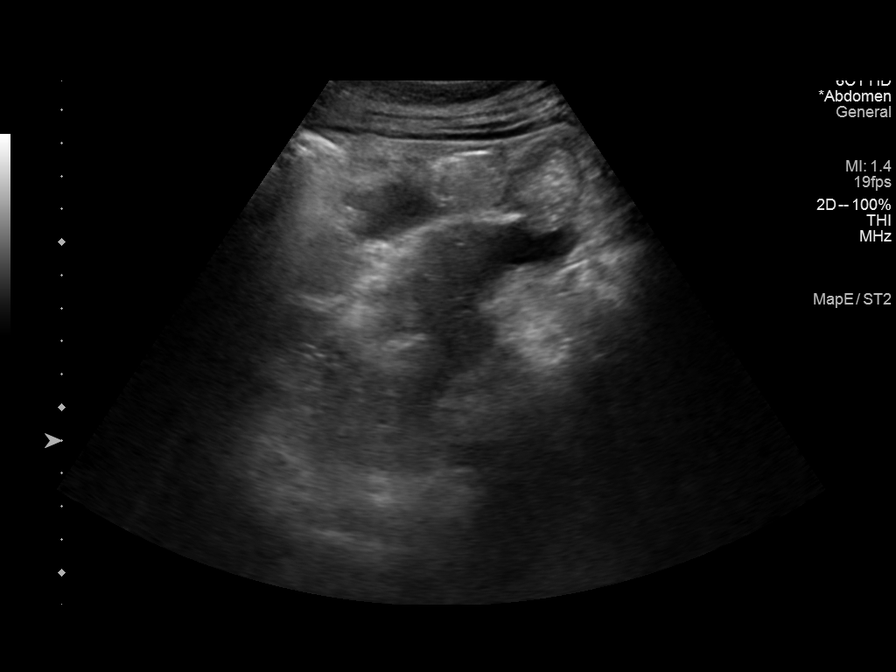
[im 6/8]
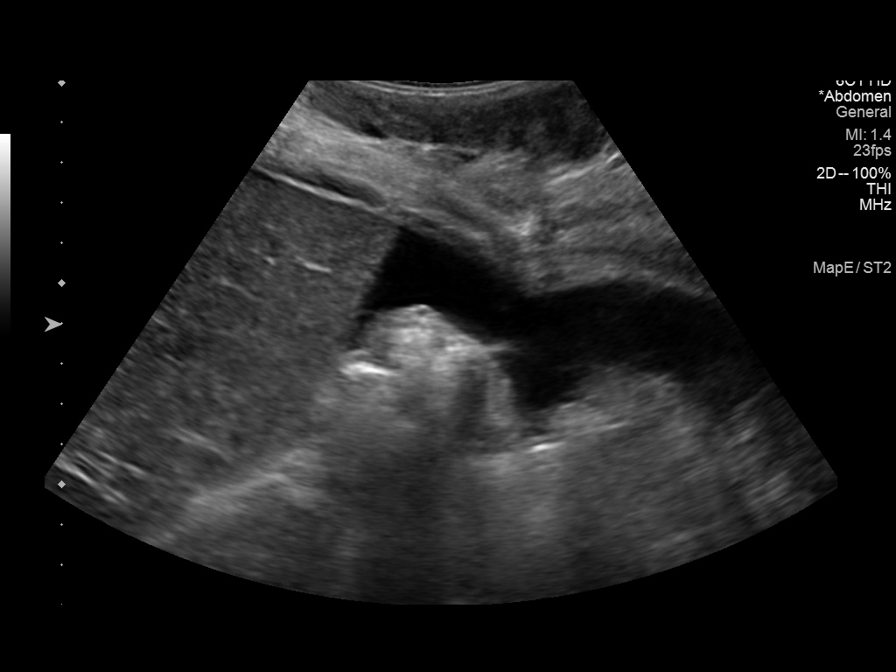
[im 7/8]
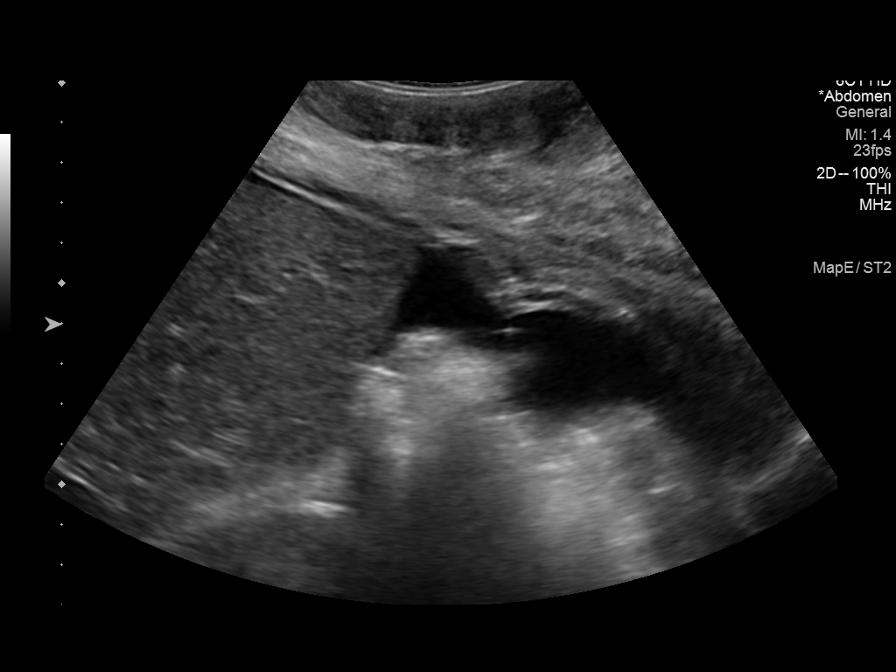
[im 8/8]
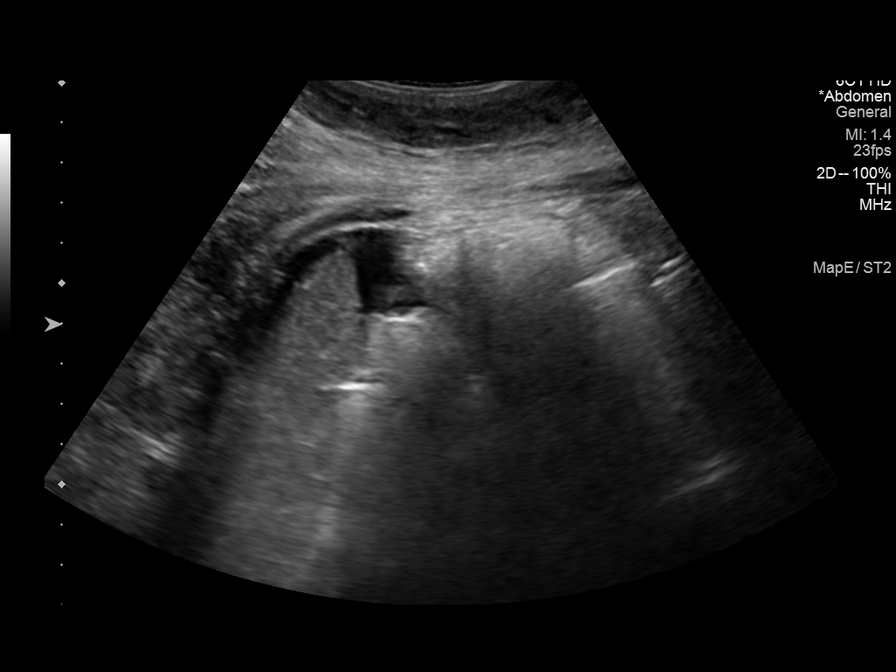

[8 of 8 positions shown; findings below may reference images not displayed]

EXAM:
ULTRASOUND GUIDED DIAGNOSTIC PARACENTESIS

MEDICATIONS:
None.

COMPLICATIONS:
None immediate.

PROCEDURE:
Informed written consent was obtained from the patient after a
discussion of the risks, benefits and alternatives to treatment. A
timeout was performed prior to the initiation of the procedure.

Initial ultrasound scanning demonstrates a small amount of ascites
within the right mid to lower abdominal quadrant. The right mid to
lower abdomen was prepped and draped in the usual sterile fashion.
2% lidocaine was used for local anesthesia.

Following this, a Yueh catheter was introduced. An ultrasound image
was saved for documentation purposes. The paracentesis was
performed. The catheter was removed and a dressing was applied. The
patient tolerated the procedure well without immediate post
procedural complication.
FINDINGS: A total of approximately 65 cc of clear, colorless fluid was
removed. Samples were sent to the laboratory as requested by the
clinical team.
IMPRESSION: Successful ultrasound-guided diagnostic paracentesis yielding 65 cc
of peritoneal fluid.

## 2017-03-11 MED ORDER — LIDOCAINE HCL 2 % IJ SOLN
INTRAMUSCULAR | Status: AC
  Filled 2017-03-11: qty 10

## 2017-03-11 NOTE — Procedures (Signed)
Ultrasound-guided diagnostic paracentesis (small amount of ascites noted today) performed yielding 65 cc of clear, colorless fluid. No immediate complications.  The fluid was submitted to the lab for preordered studies.

## 2017-03-11 NOTE — Progress Notes (Signed)
PROGRESS NOTE    [name]  KZL:[id number] DOB: 09/09/1990 DOA: 02/26/2017 PCP: [name], MD    Brief Narrative: [name] is a 26 y.o. female with medical history significant of type 1 DM with h/o remote DKA; HTN; and gastroparesis presenting with AMS.  Today, her husband went to work early this AM.  They work at the same facility and she was due to come in later.  She called him about 11:15 and asked him to come home because she was "having a panic attack."  She specifically wanted him to help her.  She finally took a nap for a couple of hours.  She soiled herself and he decided to take her to the doctor.  She was somewhat incoherent with garbled speech and hallucinations.  For example, she would try to say the word for bathroom and would forget the word, that kind of thing.  She soiled herself again.  He thought it might be a low blood sugar and instead it was high and her temperature was low.  She has been having diarrhea since last night - awoke overnight to go to the bathroom several times.  She does have soreness and rash on her buttocks.  She also complained of a boil on her bottom last week; he thought it was better but it was noticeable today.   She has had DKA, but the last time was a few years ago.  No sick contacts.  They work for [employer] and she has been complaining of being extra tired but they thought it was due to working a lot of overtime.  She did eat/drink normally yesterday.  She drank some water when her husband got home today.  She complained of n/v to him but he did not witness this.  Assessment & Plan:   Principal Problem:   MDD (major depressive disorder), recurrent episode, mild (HCC) Active Problems:   Diabetes type 1, uncontrolled (HCC)   Protein-calorie malnutrition, severe (HCC)   DKA (diabetic ketoacidosis) (HCC)   Diarrhea   Lower GI bleed  Chronic diarrhea//weight loss with anemia/malnutrition -followed by GI Dr. Collene Mares -Ct ab Carron Curie + abscess and  ascites but no bowel wall thickening -GI ordered c diff which showed antigen positive, toxin negative. Trial of vanc, but d/c'd.  - Now on creon, lomotil, imodium.  Starting cholestyramine today.  - s/p flex sig 12/11 with biopsies taken -follow further GI recommendations.  periRectal abscess  -CT imaging with complex fluids collection -s/p debridement in OR on 12/2,  - surgical culture + Klebsiella (holding for anaerobes),  zosyn d/ced, changed to ancef, with flagyl to cover anaerobes, Change to oral abx, plan to treat for total of 14 days. Last dose of abx on 12/14. -will follow [facility] rec's for wound care.   DKA with metabolic encephalopathy and  severe acidosis venous blood gas pH 6.8 on presentation:  - Resolved and has remained stable now.  Poorly controlled type 1 diabetes -H/o Medication noncompliance -Patient reports not able to afford insulin -a1c 12.2 -Diabetes educator and case manager consulted. -continue lantus and SSI  Hypoglycemia event   -Noted again this afternoon while NPO waiting on procedure -will follow CBG's status -patient educated about importance to not skip meals  1/4 Blood cx with lactobacillus:   Suspect this maybe contaminant (only 1 of 4 bcx with this result).   -Discussed with ID.   -Will have 14 day course of flagyl for perirectal wound as above, regardless.  -no fever  and non-toxic in nature.  Acute on chronic anemia -Hemoglobin 6.4 on admission, status post PRBC transfusion x1 on November 30, prbc x1 on 12/4 -B12 unremarkable, folate slightly low -transfuse prn to keep hgb >7. -FOBT+ (she had flex sig today, but this was also in setting of her perirectal abscess so not sure accuracy); no abnormalities seen. -will follow Hgb trend   Moisture associated skin damage around anal area  -Likely from chronic diarrhea -will follow Wound care service recommendations for wound care  Hypokalemia /hypomagnesemia -follow electrolyte trend and replete  as needed   Malnutrition. moderate: -CT ab/pel" Moderate volume ascites with diffuse soft tissue and body wall edema." -Albumin 1.4 -will follow Nutrition recommendations  -paracentesis to be done later today  Bilateral lower extremity edema  RUE edema:  -most likely third spacing -thoracentesis to be done later today -RUE [place] w/o DVT and LE duplex w/o DVT -continue nutrition supplements   Anxiety/ depression: -continue celexa -no SI  DVT prophylaxis: SCD Code Status: full  Family Communication: no family at bedside  Disposition Plan: remains inpatient, follow GI recommendations, paracentesis later today, continue to adjust hypoglycemic regimen.    Consultants:   Surgery and GI and PCCM  Procedures:   Perirectal abscess debridement 12/2  Paracentesis 12/12  Antimicrobials:   Zosyn 12/1-12/4  Ancef/flagyl 12/4 - 12/7, oral keflex and flagyl from 12/7-12/  Vancomycin oral 12/3- 12/6   Subjective: Afebrile, no CP, no nausea, no vomiting. Continue to have ongoing loose stools.  Objective: Vitals:   03/11/17 1346 03/11/17 1645 03/11/17 1750 03/11/17 1800  BP: 121/75 (!) 126/91 (!) 120/96   Pulse: 99     Resp: 18     Temp: 98.9 F (37.2 C)     TempSrc: Oral     SpO2: 95%     Weight:    82.2 kg (181 lb 3.5 oz)  Height:        Intake/Output Summary (Last 24 hours) at 03/11/2017 2149 Last data filed at 03/11/2017 1400 Gross per 24 hour  Intake 600 ml  Output 3 ml  Net 597 ml   Filed Weights   02/26/17 1708 03/11/17 1800  Weight: 52.2 kg (115 lb) 82.2 kg (181 lb 3.5 oz)    Examination: General: afebrile, frustrated and in distress with ongoing issues.  Cardiovascular: S1 and S2, no rubs, no gallops, no JVD Lungs: CTA bilaterally, normal resp effort. Abdomen: soft, NT, positive BS, mild distension, no guarding.  Neurological: AAOX3, no focal deficit, CN intact Extremities: 2+ LE edema bilaterally, no cyanosis, no clubbing  Psychiatric: flat  affect, no hallucinations, denies SI.   Data Reviewed: I have personally reviewed following labs and imaging studies  CBC: Recent Labs  Lab 03/05/17 0539 03/06/17 0537 03/08/17 1147 03/09/17 0527 03/10/17 0532  WBC 7.7 6.3 6.4 5.2 4.6  HGB 8.3* 8.1* 8.4* 8.2* 8.0*  HCT 25.0* 24.4* 25.7* 25.2* 25.4*  MCV 93.6 93.8 94.1 94.0 95.5  PLT 300 317 371 383 500   Basic Metabolic Panel: Recent Labs  Lab 03/06/17 0537 03/07/17 0613 03/08/17 0433 03/09/17 0527 03/10/17 0532  NA 140 139 139 140 143  K 3.7 3.9 3.5 3.9 4.0  CL 105 104 102 104 106  CO2 30 30 31  33* 32  GLUCOSE 163* 169* 118* 151* 205*  BUN 8 9 9 9 10   CREATININE 0.33* 0.37* 0.34* 0.35* 0.49  CALCIUM 7.7* 7.7* 7.7* 8.1* 8.0*  MG  --  1.6* 1.9  --   --  PHOS  --   --  3.6  --   --    GFR: Estimated Creatinine Clearance: 116.4 mL/min (by C-G formula based on SCr of 0.49 mg/dL).   Liver Function Tests: Recent Labs  Lab 03/05/17 0546 03/08/17 0433  AST  --  17  ALT  --  8*  ALKPHOS  --  78  BILITOT  --  0.2*  PROT  --  5.9*  ALBUMIN 1.3* 1.3*   CBG: Recent Labs  Lab 03/11/17 0729 03/11/17 1146 03/11/17 1713 03/11/17 1802 03/11/17 1834  GLUCAP 339* 237* 55* 53* 96    Recent Results (from the past 240 hour(s))  Gastrointestinal Panel by PCR , Stool     Status: None   Collection Time: 03/03/17  1:44 AM  Result Value Ref Range Status   Campylobacter species NOT DETECTED NOT DETECTED Final   Plesimonas shigelloides NOT DETECTED NOT DETECTED Final   Salmonella species NOT DETECTED NOT DETECTED Final   Yersinia enterocolitica NOT DETECTED NOT DETECTED Final   Vibrio species NOT DETECTED NOT DETECTED Final   Vibrio cholerae NOT DETECTED NOT DETECTED Final   Enteroaggregative E coli (EAEC) NOT DETECTED NOT DETECTED Final   Enteropathogenic E coli (EPEC) NOT DETECTED NOT DETECTED Final   Enterotoxigenic E coli (ETEC) NOT DETECTED NOT DETECTED Final   Shiga like toxin producing E coli (STEC) NOT DETECTED  NOT DETECTED Final   Shigella/Enteroinvasive E coli (EIEC) NOT DETECTED NOT DETECTED Final   Cryptosporidium NOT DETECTED NOT DETECTED Final   Cyclospora cayetanensis NOT DETECTED NOT DETECTED Final   Entamoeba histolytica NOT DETECTED NOT DETECTED Final   Giardia lamblia NOT DETECTED NOT DETECTED Final   Adenovirus F40/41 NOT DETECTED NOT DETECTED Final   Astrovirus NOT DETECTED NOT DETECTED Final   Norovirus GI/GII NOT DETECTED NOT DETECTED Final   Rotavirus A NOT DETECTED NOT DETECTED Final   Sapovirus (I, II, IV, and V) NOT DETECTED NOT DETECTED Final  Clostridium Difficile by PCR     Status: None   Collection Time: 03/04/17  9:00 AM  Result Value Ref Range Status   Toxigenic C. Difficile by PCR NEGATIVE NEGATIVE Final    Comment: Patient is colonized with non toxigenic C. difficile. May not need treatment unless significant symptoms are present. Performed at [facility], [place] [address] [place], [place] [address]       Radiology Studies: No results found.   Scheduled Meds: . acetaminophen  1,000 mg Oral TID  . cephALEXin  500 mg Oral Q6H  . cholestyramine light  4 g Oral 3 times per day  . citalopram  20 mg Oral Daily  . diphenoxylate-atropine  2 tablet Oral QID  . feeding supplement (GLUCERNA SHAKE)  237 mL Oral BID BM  . ferrous sulfate  325 mg Oral BID WC  . folic acid  1 mg Oral Daily  . gabapentin  300 mg Oral BID  . insulin aspart  0-9 Units Subcutaneous TID WC  . insulin aspart  2 Units Subcutaneous TID WC  . insulin glargine  8 Units Subcutaneous Daily  . lidocaine      . lidocaine      . lidocaine      . lipase/protease/amylase  36,000 Units Oral TID WC  . loperamide  2 mg Oral TID AC  . metroNIDAZOLE  500 mg Oral Q8H  . protein supplement shake  11 oz Oral Q24H   Continuous Infusions: . methocarbamol (ROBAXIN)  IV  LOS: 13 days    Time spent: 25 days     Barton Dubois, MD  [facility] Pager [phone]  If 7PM-7AM, please  contact night-coverage [email or web address] Password [facility] 03/11/2017, 9:49 PM

## 2017-03-11 NOTE — Progress Notes (Addendum)
Patient came to ultrasound department at 1630 for US paracentesis. Patient came back to unit at 1800.

## 2017-03-11 NOTE — Progress Notes (Signed)
Subjective: Still with diarrhea.    Objective: Vital signs in last 24 hours: Temp:  [97.5 F (36.4 C)-98.7 F (37.1 C)] 98.7 F (37.1 C) (12/12 0506) Pulse Rate:  [88-104] 100 (12/12 0506) Resp:  [6-20] 19 (12/12 0506) BP: (104-157)/(59-110) 104/59 (12/12 0506) SpO2:  [86 %-98 %] 93 % (12/12 0506) Last BM Date: 03/11/17  Intake/Output from previous day: 12/11 0701 - 12/12 0700 In: 720 [P.O.:720] Out: 2 [Urine:1; Stool:1] Intake/Output this shift: No intake/output data recorded.  General appearance: alert and weak, soft spoken GI: soft, + ascites  Lab Results: Recent Labs    03/08/17 1147 03/09/17 0527 03/10/17 0532  WBC 6.4 5.2 4.6  HGB 8.4* 8.2* 8.0*  HCT 25.7* 25.2* 25.4*  PLT 371 383 397   BMET Recent Labs    03/09/17 0527 03/10/17 0532  NA 140 143  K 3.9 4.0  CL 104 106  CO2 33* 32  GLUCOSE 151* 205*  BUN 9 10  CREATININE 0.35* 0.49  CALCIUM 8.1* 8.0*   LFT No results for input(s): PROT, ALBUMIN, AST, ALT, ALKPHOS, BILITOT, BILIDIR, IBILI in the last 72 hours. PT/INR No results for input(s): LABPROT, INR in the last 72 hours. Hepatitis Panel No results for input(s): HEPBSAG, HCVAB, HEPAIGM, HEPBIGM in the last 72 hours. C-Diff No results for input(s): CDIFFTOX in the last 72 hours. Fecal Lactopherrin No results for input(s): FECLLACTOFRN in the last 72 hours.  Studies/Results: No results found.  Medications:  Scheduled: . acetaminophen  1,000 mg Oral TID  . cephALEXin  500 mg Oral Q6H  . cholestyramine light  4 g Oral 3 times per day  . citalopram  20 mg Oral Daily  . diphenoxylate-atropine  2 tablet Oral QID  . feeding supplement (GLUCERNA SHAKE)  237 mL Oral BID BM  . ferrous sulfate  325 mg Oral BID WC  . folic acid  1 mg Oral Daily  . gabapentin  300 mg Oral BID  . insulin aspart  0-9 Units Subcutaneous TID WC  . insulin aspart  2 Units Subcutaneous TID WC  . insulin glargine  8 Units Subcutaneous Daily  . lipase/protease/amylase   36,000 Units Oral TID WC  . loperamide  2 mg Oral TID AC  . metroNIDAZOLE  500 mg Oral Q8H  . protein supplement shake  11 oz Oral Q24H   Continuous: . methocarbamol (ROBAXIN)  IV      Assessment/Plan: 1) Diarrhea. 2) Ascites. 3) Hypoalbuminemia. 4) Difficult to control DM.   The FFS was normal, but biopsies were obtained.  She sat on the toilet for 40 minutes last evening with diarrhea, but it was not continuous at that time.  She was given her first dosing of cholestyramine after her diarrheal bowel movement at 12AM.  If her diarrhea does not improve with the addition of cholesytramine over the next couple of days and the [facility] biopsies are negative for inflammation, I will stop all antidiarrheal therapy and then collect her stool to check for her electrolyte status to help determine the type of diarrhea:  Secretory, malabsorptive, osmotic, etc.  Her weight was last checked on 02/26/2017 and she was 52.15 kg.  The examination appears that her ascites has increased.  I will order a diagnostic paracentesis.  The thought is that this is from malnutrition, but other etiologies need to be excluded.  Plan: 1) Paracentesis. 2) Continue with cholestyramine, Creon, and antidiarrheals. 3) Continue with blood sugar control. 4) Obtain weight.  LOS: 13 days   Virginio Isidore  D 03/11/2017, 7:50 AM

## 2017-03-12 ENCOUNTER — Encounter (HOSPITAL_COMMUNITY): Payer: Self-pay | Admitting: Gastroenterology

## 2017-03-12 LAB — GLUCOSE, CAPILLARY
Glucose-Capillary: 147 mg/dL — ABNORMAL HIGH (ref 65–99)
Glucose-Capillary: 141 mg/dL — ABNORMAL HIGH (ref 65–99)
Glucose-Capillary: 128 mg/dL — ABNORMAL HIGH (ref 65–99)
Glucose-Capillary: 140 mg/dL — ABNORMAL HIGH (ref 65–99)

## 2017-03-12 LAB — AMYLASE, PLEURAL OR PERITONEAL FLUID: Amylase, Fluid: 15 U/L

## 2017-03-12 NOTE — Progress Notes (Signed)
PROGRESS NOTE    DONNELL BEAUCHAMP  WPV:[id number] DOB: 16-Feb-1991 DOA: 02/26/2017 PCP: [name], MD    Brief Narrative: [name] is a 26 y.o. female with medical history significant of type 1 DM with h/o remote DKA; HTN; and gastroparesis presenting with AMS.  Today, her husband went to work early this AM.  They work at the same facility and she was due to come in later.  She called him about 11:15 and asked him to come home because she was "having a panic attack."  She specifically wanted him to help her.  She finally took a nap for a couple of hours.  She soiled herself and he decided to take her to the doctor.  She was somewhat incoherent with garbled speech and hallucinations.  For example, she would try to say the word for bathroom and would forget the word, that kind of thing.  She soiled herself again.  He thought it might be a low blood sugar and instead it was high and her temperature was low.  She has been having diarrhea since last night - awoke overnight to go to the bathroom several times.  She does have soreness and rash on her buttocks.  She also complained of a boil on her bottom last week; he thought it was better but it was noticeable today.   She has had DKA, but the last time was a few years ago.  No sick contacts.  They work for [employer] and she has been complaining of being extra tired but they thought it was due to working a lot of overtime.  She did eat/drink normally yesterday.  She drank some water when her husband got home today.  She complained of n/v to him but he did not witness this.  Assessment & Plan:   Principal Problem:   MDD (major depressive disorder), recurrent episode, mild (HCC) Active Problems:   Diabetes type 1, uncontrolled (HCC)   Protein-calorie malnutrition, severe (HCC)   DKA (diabetic ketoacidosis) (HCC)   Diarrhea   Lower GI bleed  Chronic diarrhea//weight loss with anemia/malnutrition -followed by GI Dr. Collene Mares -Ct ab [place] + abscess and  ascites but no bowel wall thickening -GI ordered c diff which showed antigen positive, toxin negative. Trial of vanc, but d/c'd.  - Now on creon, lomotil, imodium and cholestyramine started on 12/12; some improvement in quantity of BM's, even still loose. -will monitor -advise to maintain adequate hydration   - s/p flex sig 12/11 with biopsies taken -follow further GI recommendations.  periRectal abscess  -CT imaging with complex fluids collection -s/p debridement in OR on 12/2,  - surgical culture + Klebsiella (holding for anaerobes),  zosyn d/ced, changed to ancef, with flagyl to cover anaerobes, Change to oral abx, plan to treat for total of 14 days. Last dose of abx on 12/14. -will follow [facility] any further rec's for wound care.  DKA with metabolic encephalopathy and  severe acidosis venous blood gas pH 6.8 on presentation:  -Resolved  -Gap has remained closed. -CBG's less than 200  Poorly controlled type 1 diabetes -H/o Medication noncompliance -Patient reports not able to afford insulin -a1c 12.2 -Diabetes educator and case manager consulted. -continue lantus and SSI -CBG's stable with current regimen   Hypoglycemia event   -no further drops on CBG's; will monitor  -patient educated about importance to not skip meals -episodes had only happened while NPO.  1/4 Blood cx with lactobacillus:   Suspect this maybe contaminant (only 1  of 4 bcx with this result).   -Discussed with ID.   -Will have 14 day course of flagyl for perirectal wound as above, regardless.  -no fever and non-toxic in appearance -continue monitoring .  Acute on chronic anemia -Hemoglobin 6.4 on admission, status post PRBC transfusion x1 on November 30, prbc x1 on 12/4 -B12 unremarkable, folate slightly low -transfuse prn to keep hgb >7. -FOBT+ (she had flex-sigmoidoscopy on 12/11, but this was also in setting of her perirectal abscess so not sure accuracy); no abnormalities seen. -will follow Hgb trend  and further transfuse as needed   Moisture associated skin damage around anal area  -Likely from chronic diarrhea -will follow Wound care service recommendations for wound care -no significant pain reported   Hypokalemia /hypomagnesemia -continue repletion as needed   Malnutrition. moderate: -CT ab/pel" Moderate volume ascites with diffuse soft tissue and body wall edema." -Albumin 1.4 -will follow Nutrition recommendations -paracentesis not helping on diagnosis/source of swelling. Most  Bilateral lower extremity edema  RUE edema:  -most likely third spacing  likely associated with low albumin and poor nutrition. -RUE [place] w/o DVT and LE duplex w/o DVT -continue nutrition supplements  -will add ted hoses to assist with swelling (12 hours on and 12 hours off)  Anxiety/ depression: -mood stable -continue celexa  DVT prophylaxis: SCD Code Status: full  Family Communication: no family at bedside  Disposition Plan: remains inpatient, follow GI recommendations, paracentesis later today, continue to adjust hypoglycemic regimen.    Consultants:   Surgery and GI and PCCM  Procedures:   Perirectal abscess debridement 12/2  Paracentesis 12/12  Antimicrobials:   Zosyn 12/1-12/4  Ancef/flagyl 12/4 - 12/7, oral keflex and flagyl from 12/7-12/14  Vancomycin oral 12/3- 12/6   Subjective: No fever, no CP, no nausea, no vomiting, no SOB. Patient still with diarrhea, even she reported slight improvement in quantity of BM's per day.  Objective: Vitals:   03/11/17 1800 03/11/17 2126 03/12/17 0512 03/12/17 1535  BP:  122/83 (!) 127/93 (!) 133/95  Pulse:  92 91 92  Resp:  16 10 (!) 92  Temp:  98.6 F (37 C) 97.8 F (36.6 C) 97.9 F (36.6 C)  TempSrc:  Oral Oral Oral  SpO2:  98% 92% 93%  Weight: 82.2 kg (181 lb 3.5 oz)     Height:       No intake or output data in the 24 hours ending 03/12/17 2230 Filed Weights   02/26/17 1708 03/11/17 1800  Weight: 52.2 kg (115  lb) 82.2 kg (181 lb 3.5 oz)    Examination: General: no fever, no nausea, no vomiting, feeling better. Still with ongoing diarrhea (even she reports mild improvement).  Rest of medical exam unchanged from my examination on 12/12; see below for details: Cardiovascular: S1 and S2, no rubs, no gallops, no JVD Lungs: CTA bilaterally, normal resp effort. Abdomen: soft, NT, positive BS, mild distension, no guarding.  Neurological: AAOX3, no focal deficit, CN intact Extremities: 2+ LE edema bilaterally, no cyanosis, no clubbing  Psychiatric: flat affect, no hallucinations, denies SI.   Data Reviewed: I have personally reviewed following labs and imaging studies  CBC: Recent Labs  Lab 03/06/17 0537 03/08/17 1147 03/09/17 0527 03/10/17 0532  WBC 6.3 6.4 5.2 4.6  HGB 8.1* 8.4* 8.2* 8.0*  HCT 24.4* 25.7* 25.2* 25.4*  MCV 93.8 94.1 94.0 95.5  PLT 317 371 383 203   Basic Metabolic Panel: Recent Labs  Lab 03/06/17 0537 03/07/17 5597 03/08/17 0433 03/09/17 0527  03/10/17 0532  NA 140 139 139 140 143  K 3.7 3.9 3.5 3.9 4.0  CL 105 104 102 104 106  CO2 30 30 31  33* 32  GLUCOSE 163* 169* 118* 151* 205*  BUN 8 9 9 9 10   CREATININE 0.33* 0.37* 0.34* 0.35* 0.49  CALCIUM 7.7* 7.7* 7.7* 8.1* 8.0*  MG  --  1.6* 1.9  --   --   PHOS  --   --  3.6  --   --    GFR: Estimated Creatinine Clearance: 116.4 mL/min (by C-G formula based on SCr of 0.49 mg/dL).   Liver Function Tests: Recent Labs  Lab 03/08/17 0433  AST 17  ALT 8*  ALKPHOS 78  BILITOT 0.2*  PROT 5.9*  ALBUMIN 1.3*   CBG: Recent Labs  Lab 03/11/17 2125 03/12/17 0754 03/12/17 1143 03/12/17 1704 03/12/17 2215  GLUCAP 155* 147* 141* 128* 140*    Recent Results (from the past 240 hour(s))  Gastrointestinal Panel by PCR , Stool     Status: None   Collection Time: 03/03/17  1:44 AM  Result Value Ref Range Status   Campylobacter species NOT DETECTED NOT DETECTED Final   Plesimonas shigelloides NOT DETECTED NOT  DETECTED Final   Salmonella species NOT DETECTED NOT DETECTED Final   Yersinia enterocolitica NOT DETECTED NOT DETECTED Final   Vibrio species NOT DETECTED NOT DETECTED Final   Vibrio cholerae NOT DETECTED NOT DETECTED Final   Enteroaggregative E coli (EAEC) NOT DETECTED NOT DETECTED Final   Enteropathogenic E coli (EPEC) NOT DETECTED NOT DETECTED Final   Enterotoxigenic E coli (ETEC) NOT DETECTED NOT DETECTED Final   Shiga like toxin producing E coli (STEC) NOT DETECTED NOT DETECTED Final   Shigella/Enteroinvasive E coli (EIEC) NOT DETECTED NOT DETECTED Final   Cryptosporidium NOT DETECTED NOT DETECTED Final   Cyclospora cayetanensis NOT DETECTED NOT DETECTED Final   Entamoeba histolytica NOT DETECTED NOT DETECTED Final   Giardia lamblia NOT DETECTED NOT DETECTED Final   Adenovirus F40/41 NOT DETECTED NOT DETECTED Final   Astrovirus NOT DETECTED NOT DETECTED Final   Norovirus GI/GII NOT DETECTED NOT DETECTED Final   Rotavirus A NOT DETECTED NOT DETECTED Final   Sapovirus (I, II, IV, and V) NOT DETECTED NOT DETECTED Final  Clostridium Difficile by PCR     Status: None   Collection Time: 03/04/17  9:00 AM  Result Value Ref Range Status   Toxigenic C. Difficile by PCR NEGATIVE NEGATIVE Final    Comment: Patient is colonized with non toxigenic C. difficile. May not need treatment unless significant symptoms are present. Performed at [facility], [place] [address] [place], [place] [address]      Radiology Studies: US Paracentesis  Result Date: 03/12/2017 INDICATION: Patient with history of diarrhea, low albumin/malnutrition, diabetes, ascites, perirectal fluid collection; request made for diagnostic and therapeutic paracentesis. EXAM: ULTRASOUND GUIDED DIAGNOSTIC PARACENTESIS MEDICATIONS: None. COMPLICATIONS: None immediate. PROCEDURE: Informed written consent was obtained from the patient after a discussion of the risks, benefits and alternatives to treatment. A timeout was  performed prior to the initiation of the procedure. Initial ultrasound scanning demonstrates a small amount of ascites within the right mid to lower abdominal quadrant. The right mid to lower abdomen was prepped and draped in the usual sterile fashion. 2% lidocaine was used for local anesthesia. Following this, a Yueh catheter was introduced. An ultrasound image was saved for documentation purposes. The paracentesis was performed. The catheter was removed and a dressing was applied.  The patient tolerated the procedure well without immediate post procedural complication. FINDINGS: A total of approximately 65 cc of clear, colorless fluid was removed. Samples were sent to the laboratory as requested by the clinical team. IMPRESSION: Successful ultrasound-guided diagnostic paracentesis yielding 65 cc of peritoneal fluid. Read by: Rowe Robert, PA-C Electronically Signed   By: Jerilynn Mages.  Shick M.D.   On: 03/11/2017 17:51    Scheduled Meds: . acetaminophen  1,000 mg Oral TID  . cephALEXin  500 mg Oral Q6H  . cholestyramine light  4 g Oral 3 times per day  . citalopram  20 mg Oral Daily  . diphenoxylate-atropine  2 tablet Oral QID  . feeding supplement (GLUCERNA SHAKE)  237 mL Oral BID BM  . ferrous sulfate  325 mg Oral BID WC  . folic acid  1 mg Oral Daily  . gabapentin  300 mg Oral BID  . insulin aspart  0-9 Units Subcutaneous TID WC  . insulin aspart  2 Units Subcutaneous TID WC  . insulin glargine  8 Units Subcutaneous Daily  . lipase/protease/amylase  36,000 Units Oral TID WC  . loperamide  2 mg Oral TID AC  . metroNIDAZOLE  500 mg Oral Q8H  . protein supplement shake  11 oz Oral Q24H   Continuous Infusions: . methocarbamol (ROBAXIN)  IV       LOS: 14 days    Time spent: 25 days     Barton Dubois, MD  [facility] Pager [phone]  If 7PM-7AM, please contact night-coverage [email or web address] Password [email or web address] 03/12/2017, 10:30 PM

## 2017-03-12 NOTE — Progress Notes (Signed)
Pts dressing changed and linens changed

## 2017-03-12 NOTE — Progress Notes (Signed)
Subjective: First time to have a solid stool yesterday.  Feeling better.  Objective: Vital signs in last 24 hours: Temp:  [97.8 F (36.6 C)-98.9 F (37.2 C)] 97.8 F (36.6 C) (12/13 0512) Pulse Rate:  [91-99] 91 (12/13 0512) Resp:  [10-18] 10 (12/13 0512) BP: (120-127)/(75-96) 127/93 (12/13 0512) SpO2:  [92 %-98 %] 92 % (12/13 0512) Weight:  [82.2 kg (181 lb 3.5 oz)] 82.2 kg (181 lb 3.5 oz) (12/12 1800) Last BM Date: 03/11/17  Intake/Output from previous day: 12/12 0701 - 12/13 0700 In: 240 [P.O.:240] Out: 3 [Urine:3] Intake/Output this shift: No intake/output data recorded.  General appearance: alert and no distress GI: soft, non-tender; bowel sounds normal; no masses,  no organomegaly and + ascites  Lab Results: Recent Labs    03/10/17 0532  WBC 4.6  HGB 8.0*  HCT 25.4*  PLT 397   BMET Recent Labs    03/10/17 0532  NA 143  K 4.0  CL 106  CO2 32  GLUCOSE 205*  BUN 10  CREATININE 0.49  CALCIUM 8.0*   LFT No results for input(s): PROT, ALBUMIN, AST, ALT, ALKPHOS, BILITOT, BILIDIR, IBILI in the last 72 hours. PT/INR No results for input(s): LABPROT, INR in the last 72 hours. Hepatitis Panel No results for input(s): HEPBSAG, HCVAB, HEPAIGM, HEPBIGM in the last 72 hours. C-Diff No results for input(s): CDIFFTOX in the last 72 hours. Fecal Lactopherrin No results for input(s): FECLLACTOFRN in the last 72 hours.  Studies/Results: US Paracentesis  Result Date: 03/12/2017 INDICATION: Patient with history of diarrhea, low albumin/malnutrition, diabetes, ascites, perirectal fluid collection; request made for diagnostic and therapeutic paracentesis. EXAM: ULTRASOUND GUIDED DIAGNOSTIC PARACENTESIS MEDICATIONS: None. COMPLICATIONS: None immediate. PROCEDURE: Informed written consent was obtained from the patient after a discussion of the risks, benefits and alternatives to treatment. A timeout was performed prior to the initiation of the procedure. Initial ultrasound  scanning demonstrates a small amount of ascites within the right mid to lower abdominal quadrant. The right mid to lower abdomen was prepped and draped in the usual sterile fashion. 2% lidocaine was used for local anesthesia. Following this, a Yueh catheter was introduced. An ultrasound image was saved for documentation purposes. The paracentesis was performed. The catheter was removed and a dressing was applied. The patient tolerated the procedure well without immediate post procedural complication. FINDINGS: A total of approximately 65 cc of clear, colorless fluid was removed. Samples were sent to the laboratory as requested by the clinical team. IMPRESSION: Successful ultrasound-guided diagnostic paracentesis yielding 65 cc of peritoneal fluid. Read by: Rowe Robert, PA-C Electronically Signed   By: Jerilynn Mages.  Shick M.D.   On: 03/11/2017 17:51    Medications:  Scheduled: . acetaminophen  1,000 mg Oral TID  . cephALEXin  500 mg Oral Q6H  . cholestyramine light  4 g Oral 3 times per day  . citalopram  20 mg Oral Daily  . diphenoxylate-atropine  2 tablet Oral QID  . feeding supplement (GLUCERNA SHAKE)  237 mL Oral BID BM  . ferrous sulfate  325 mg Oral BID WC  . folic acid  1 mg Oral Daily  . gabapentin  300 mg Oral BID  . insulin aspart  0-9 Units Subcutaneous TID WC  . insulin aspart  2 Units Subcutaneous TID WC  . insulin glargine  8 Units Subcutaneous Daily  . lipase/protease/amylase  36,000 Units Oral TID WC  . loperamide  2 mg Oral TID AC  . metroNIDAZOLE  500 mg Oral Q8H  .  protein supplement shake  11 oz Oral Q24H   Continuous: . methocarbamol (ROBAXIN)  IV      Assessment/Plan: 1) Diarrhea - improved. 2) Ascites. 3) DM.   It appears that cholestyramine has helped with her diarrhea.  She will remain on the medications.  Her ascites is difficult to determine the source as the albumin in the ascitic fluid is read to be <1.0  I called the lab and they cannot quantify it any further.  The  SAAG cannot be calculated.  Given her clinical presentation the ascites and edema are most likely from hypoalbuminemia as a result of her diarrhea and/or malnutrition.  No evidence of SBP.  The rectal and sigmoid colon biopsies were negative for any evidence of inflammation.  Plan: 1) Continue with current antidiarrheal regimen. 2) Continue with tight control of blood sugar. 3) Continue with nutrition.   LOS: 14 days   Rishan Oyama D 03/12/2017, 1:42 PM

## 2017-03-13 LAB — PATHOLOGIST SMEAR REVIEW

## 2017-03-13 LAB — GLUCOSE, CAPILLARY
Glucose-Capillary: 310 mg/dL — ABNORMAL HIGH (ref 65–99)
Glucose-Capillary: 214 mg/dL — ABNORMAL HIGH (ref 65–99)
Glucose-Capillary: 133 mg/dL — ABNORMAL HIGH (ref 65–99)
Glucose-Capillary: 141 mg/dL — ABNORMAL HIGH (ref 65–99)

## 2017-03-13 MED ORDER — INSULIN GLARGINE 100 UNIT/ML ~~LOC~~ SOLN
10.0000 [IU] | Freq: Every day | SUBCUTANEOUS | Status: DC
  Administered 2017-03-13 – 2017-03-14 (×2): 10 [IU] via SUBCUTANEOUS
  Filled 2017-03-13 (×2): qty 0.1

## 2017-03-13 NOTE — Progress Notes (Signed)
Nutrition Follow-up  DOCUMENTATION CODES:   Underweight, Non-severe (moderate) malnutrition in context of chronic illness  INTERVENTION:   Glucerna Shake po BID, each supplement provides 220 kcal and 10 grams of protein  NUTRITION DIAGNOSIS:   Moderate Malnutrition related to chronic illness(T1DM non-compliance) as evidenced by percent weight loss, moderate fat depletion, moderate muscle depletion.  Ongoing  GOAL:   Patient will meet greater than or equal to 90% of their needs  Progessing  MONITOR:   PO intake, Supplement acceptance, Weight trends, Labs  REASON FOR ASSESSMENT:   Other (Comment)(Low BMI)    ASSESSMENT:   Pt with PMH significant for type 1 DM, HTN, and gastroparesis. Presents this admission with altered mental status. Admitted for DKA.    Appetite progressing back to baseline. Meal completions charted as 73% average for her last eight meals. Pt tolerating Glucerna but not premier protein. Will continue with Glucerna only. Diarrhea has resolved per pt. Pt reports she may be going home tomorrow. Discussed the importance of protein for wound healing moving forward and insulin compliance. No recent weights have been obtained.   Medications reviewed and include: ferrous sulfate, folic acid, SSI, lantus, creon, IV abx Labs reviewed: CBG [id number]  Diet Order:  Diet Carb Modified Fluid consistency: Thin; Room service appropriate? Yes  EDUCATION NEEDS:   Education needs have been addressed  Skin:  Skin Assessment: Skin Integrity Issues: Skin Integrity Issues:: Incisions Incisions: perineum  Last BM:  03/12/17  Height:   Ht Readings from Last 1 Encounters:  02/26/17 5\' 7"  (1.702 m)    Weight:   Wt Readings from Last 1 Encounters:  03/11/17 181 lb 3.5 oz (82.2 kg)    Ideal Body Weight:  61.4 kg  BMI:  Body mass index is 28.38 kg/m.  Estimated Nutritional Needs:   Kcal:  1600-1800 kcal/day  Protein:  80-90 g/day  Fluid:  >1.6  L/day    Vanessa Kick RD, LDN Clinical Nutrition Pager # [phone]

## 2017-03-13 NOTE — Progress Notes (Signed)
Subjective: Three diarrheal bowel movements yesterday.  No bowel movements overnight.  Objective: Vital signs in last 24 hours: Temp:  [97.9 F (36.6 C)-98.5 F (36.9 C)] 98.5 F (36.9 C) (12/14 0436) Pulse Rate:  [92-99] 99 (12/14 0436) Resp:  [20-92] 20 (12/14 0436) BP: (133-139)/(95-96) 137/96 (12/14 0436) SpO2:  [93 %-96 %] 96 % (12/14 0436) Last BM Date: 03/12/17  Intake/Output from previous day: No intake/output data recorded. Intake/Output this shift: Total I/O In: 120 [P.O.:120] Out: -   General appearance: alert and no distress GI: + ascites.  Lab Results: No results for input(s): WBC, HGB, HCT, PLT in the last 72 hours. BMET No results for input(s): NA, K, CL, CO2, GLUCOSE, BUN, CREATININE, CALCIUM in the last 72 hours. LFT No results for input(s): PROT, ALBUMIN, AST, ALT, ALKPHOS, BILITOT, BILIDIR, IBILI in the last 72 hours. PT/INR No results for input(s): LABPROT, INR in the last 72 hours. Hepatitis Panel No results for input(s): HEPBSAG, HCVAB, HEPAIGM, HEPBIGM in the last 72 hours. C-Diff No results for input(s): CDIFFTOX in the last 72 hours. Fecal Lactopherrin No results for input(s): FECLLACTOFRN in the last 72 hours.  Studies/Results: US Paracentesis  Result Date: 03/12/2017 INDICATION: Patient with history of diarrhea, low albumin/malnutrition, diabetes, ascites, perirectal fluid collection; request made for diagnostic and therapeutic paracentesis. EXAM: ULTRASOUND GUIDED DIAGNOSTIC PARACENTESIS MEDICATIONS: None. COMPLICATIONS: None immediate. PROCEDURE: Informed written consent was obtained from the patient after a discussion of the risks, benefits and alternatives to treatment. A timeout was performed prior to the initiation of the procedure. Initial ultrasound scanning demonstrates a small amount of ascites within the right mid to lower abdominal quadrant. The right mid to lower abdomen was prepped and draped in the usual sterile fashion. 2%  lidocaine was used for local anesthesia. Following this, a Yueh catheter was introduced. An ultrasound image was saved for documentation purposes. The paracentesis was performed. The catheter was removed and a dressing was applied. The patient tolerated the procedure well without immediate post procedural complication. FINDINGS: A total of approximately 65 cc of clear, colorless fluid was removed. Samples were sent to the laboratory as requested by the clinical team. IMPRESSION: Successful ultrasound-guided diagnostic paracentesis yielding 65 cc of peritoneal fluid. Read by: Rowe Robert, PA-C Electronically Signed   By: Jerilynn Mages.  Shick M.D.   On: 03/11/2017 17:51    Medications:  Scheduled: . acetaminophen  1,000 mg Oral TID  . cephALEXin  500 mg Oral Q6H  . cholestyramine light  4 g Oral 3 times per day  . citalopram  20 mg Oral Daily  . diphenoxylate-atropine  2 tablet Oral QID  . feeding supplement (GLUCERNA SHAKE)  237 mL Oral BID BM  . ferrous sulfate  325 mg Oral BID WC  . folic acid  1 mg Oral Daily  . gabapentin  300 mg Oral BID  . insulin aspart  0-9 Units Subcutaneous TID WC  . insulin aspart  2 Units Subcutaneous TID WC  . insulin glargine  10 Units Subcutaneous Daily  . lipase/protease/amylase  36,000 Units Oral TID WC  . loperamide  2 mg Oral TID AC  . metroNIDAZOLE  500 mg Oral Q8H   Continuous: . methocarbamol (ROBAXIN)  IV      Assessment/Plan: 1) Diarrhea - improved with the addition of cholestyramine. 2) DM. 3) Ascites - Most likely from malnutrition.  No overt liver issues.   She will remain on the current regimen for her diarrhea.  Some days will be better than others.  Hopefully she can average 3 or less bowel movements.  I did confront/counsel her about her choice of foods.  I have noticed on several occassions high carbohydrate foods, such as Clent Jacks.  With her difficult to control diabetes and the additional carbodhydrate intake, it is contributing to the  deleterious effects DM.  I also counseled her about avoiding any sugary drinks, even if it Sprite Zero, etc.  It is clear that even these sugar substitute foods do not change the behavior of patients and their choice of foods.  She was not very accepting of this fact.    Plan: 1) Continue with the current anti-diarrheal regimen. 2) Tight diabetic control with medications and appropriate food choices.  LOS: 15 days   Arhianna Ebey D 03/13/2017, 1:18 PM

## 2017-03-13 NOTE — Progress Notes (Signed)
Date: March 13, 2017 Lantus 0 copay card given to patient. Velva Harman, BSN, [place], CCM:  [phone]

## 2017-03-13 NOTE — Progress Notes (Signed)
PROGRESS NOTE    [name]  QMG:[id number] DOB: Feb 09, 1991 DOA: 02/26/2017 PCP: [name], MD    Brief Narrative: [name] is a 26 y.o. female with medical history significant of type 1 DM with h/o remote DKA; HTN; and gastroparesis presenting with AMS.  Today, her husband went to work early this AM.  They work at the same facility and she was due to come in later.  She called him about 11:15 and asked him to come home because she was "having a panic attack."  She specifically wanted him to help her.  She finally took a nap for a couple of hours.  She soiled herself and he decided to take her to the doctor.  She was somewhat incoherent with garbled speech and hallucinations.  For example, she would try to say the word for bathroom and would forget the word, that kind of thing.  She soiled herself again.  He thought it might be a low blood sugar and instead it was high and her temperature was low.  She has been having diarrhea since last night - awoke overnight to go to the bathroom several times.  She does have soreness and rash on her buttocks.  She also complained of a boil on her bottom last week; he thought it was better but it was noticeable today.   She has had DKA, but the last time was a few years ago.  No sick contacts.  They work for [employer] and she has been complaining of being extra tired but they thought it was due to working a lot of overtime.  She did eat/drink normally yesterday.  She drank some water when her husband got home today.  She complained of n/v to him but he did not witness this.  Assessment & Plan:   Principal Problem:   MDD (major depressive disorder), recurrent episode, mild (HCC) Active Problems:   Diabetes type 1, uncontrolled (HCC)   Protein-calorie malnutrition, severe (HCC)   DKA (diabetic ketoacidosis) (HCC)   Diarrhea   Lower GI bleed  Chronic diarrhea//weight loss with anemia/malnutrition -followed by GI Dr. Collene Mares -Ct ab [place] + abscess and  ascites but no bowel wall thickening -GI ordered c diff which showed antigen positive, toxin negative. Trial of vanc, but d/c'd.  - Now on creon, lomotil, imodium and cholestyramine started on 12/12; some improvement in quantity of BM's, even still loose. -will monitor -advise to maintain adequate hydration   - s/p flex sig 12/11 with biopsies taken -follow further GI recommendations.  periRectal abscess  -CT imaging with complex fluids collection -s/p debridement in OR on 12/2,  - surgical culture + Klebsiella (holding for anaerobes),  zosyn d/ced, changed to ancef, with flagyl to cover anaerobes, Change to oral abx, plan to treat for total of 14 days. Last dose of abx on 12/14. -will follow [facility] any further rec's for wound care. -Patient has remained afebrile, WBCs within normal limits  DKA with metabolic encephalopathy and  severe acidosis venous blood gas pH 6.8 on presentation:  -Resolved  -Gap has remained closed. -CBG's 200-300 now that she is eating more again.  Poorly controlled type 1 diabetes -H/o Medication noncompliance -Patient reports not able to afford insulin -a1c 12.2 -Diabetes educator and case manager consulted. -continue lantus and SSI; Lantus dose adjusted to 10 units daily. -CBG's stable with current regimen   Hypoglycemia event   -no further drops on CBG's; will monitor  -patient educated about importance to not skip meals -  episodes had only happened while NPO. -BG is reaching 200-300 range -Patient Lantus adjusted to 10 units for better control.  We will follow CBGs. -Further education about food choices provided today.  1/4 Blood cx with lactobacillus:   Suspect this maybe contaminant (only 1 of 4 bcx with this result).   -Discussed with ID.   -Will have 14 day course of flagyl for perirectal wound as above, regardless.  -no fever and non-toxic in appearance -continue monitoring .  Acute on chronic anemia -Hemoglobin 6.4 on admission, status post  PRBC transfusion x1 on November 30, prbc x1 on 12/4 -B12 unremarkable, folate slightly low -transfuse prn to keep hgb >7. -FOBT+ (she had flex-sigmoidoscopy on 12/11, but this was also in setting of her perirectal abscess so not sure accuracy); no abnormalities seen. -will follow Hgb trend and further transfuse as needed   Moisture associated skin damage around anal area  -Likely from chronic diarrhea -will follow Wound care service recommendations for wound care -no significant pain reported   Hypokalemia /hypomagnesemia -Repleted and stable -Follow trend and further replete as needed.  Malnutrition. moderate: -CT ab/pel" Moderate volume ascites with diffuse soft tissue and body wall edema." -Albumin 1.4 -will follow Nutritional service recommendations for feeding supplement -paracentesis not helping on diagnosis/source of swelling. Most likely associated with poor nutrition.  Bilateral lower extremity edema  RUE edema:  -most likely third spacing  likely associated with low albumin and poor nutrition. -RUE [place] w/o DVT and LE duplex w/o DVT -continue nutrition supplements  -TED hoses to assist with swelling and peripheral edema.    Anxiety/ depression: -Mood stable -Continue Celexa  DVT prophylaxis: SCD Code Status: full  Family Communication: no family at bedside  Disposition Plan: remains inpatient, follow GI recommendations, paracentesis later today, continue to adjust hypoglycemic regimen.    Consultants:   Surgery and GI and PCCM  Procedures:   Perirectal abscess debridement 12/2  Paracentesis 12/12  Antimicrobials:   Zosyn 12/1-12/4  Ancef/flagyl 12/4 - 12/7, oral keflex and flagyl from 12/7-12/14  Vancomycin oral 12/3- 12/6   Subjective: No fever, no chest pain, no nausea or vomiting.  Patient reports that she is tolerating food better and has some decrease in her loose stools.  Objective: Vitals:   03/12/17 1535 03/12/17 2216 03/13/17 0436  03/13/17 1426  BP: (!) 133/95 (!) 139/96 (!) 137/96 118/79  Pulse: 92 93 99 97  Resp: (!) 92 20 20 20   Temp: 97.9 F (36.6 C) 98 F (36.7 C) 98.5 F (36.9 C) 98.6 F (37 C)  TempSrc: Oral Oral Oral Oral  SpO2: 93% 94% 96% 92%  Weight:      Height:        Intake/Output Summary (Last 24 hours) at 03/13/2017 1823 Last data filed at 03/13/2017 1500 Gross per 24 hour  Intake 360 ml  Output -  Net 360 ml   Filed Weights   02/26/17 1708 03/11/17 1800  Weight: 52.2 kg (115 lb) 82.2 kg (181 lb 3.5 oz)    Examination: General: No fever, no nausea, no vomiting; overall feeling much better and reported improvement in the quantity of loose stools throughout the day.  CBGs reaching mid 200s and 300 range. Cardiovascular: S1 and S2, no rubs, no gallops, no JVD.  Lungs: Clear to auscultation bilaterally, normal respiratory effort.   Abdomen: Soft, nontender, positive bowel sounds, mild distention, no guarding. Neurological: Alert, awake and oriented x3, cranial nerves intact, no focal neurologic or motor deficit on exam.  Extremities: 2+ lower extremity edema bilaterally, no cyanosis, no clubbing.   Psychiatric: No hallucinations, no suicidal ideation, patient was more interactive during examination today.  Still have a mild flat affect.   Skin: Large ulcer in her gluteal area with serous sanguinous discharge, no other Odor, dressing in place.   Data Reviewed: I have personally reviewed following labs and imaging studies  CBC: Recent Labs  Lab 03/08/17 1147 03/09/17 0527 03/10/17 0532  WBC 6.4 5.2 4.6  HGB 8.4* 8.2* 8.0*  HCT 25.7* 25.2* 25.4*  MCV 94.1 94.0 95.5  PLT 371 383 754   Basic Metabolic Panel: Recent Labs  Lab 03/07/17 0613 03/08/17 0433 03/09/17 0527 03/10/17 0532  NA 139 139 140 143  K 3.9 3.5 3.9 4.0  CL 104 102 104 106  CO2 30 31 33* 32  GLUCOSE 169* 118* 151* 205*  BUN 9 9 9 10   CREATININE 0.37* 0.34* 0.35* 0.49  CALCIUM 7.7* 7.7* 8.1* 8.0*  MG 1.6*  1.9  --   --   PHOS  --  3.6  --   --    GFR: Estimated Creatinine Clearance: 116.4 mL/min (by C-G formula based on SCr of 0.49 mg/dL).   Liver Function Tests: Recent Labs  Lab 03/08/17 0433  AST 17  ALT 8*  ALKPHOS 78  BILITOT 0.2*  PROT 5.9*  ALBUMIN 1.3*   CBG: Recent Labs  Lab 03/12/17 1704 03/12/17 2215 03/13/17 0753 03/13/17 1208 03/13/17 1651  GLUCAP 128* 140* 310* 214* 133*    Recent Results (from the past 240 hour(s))  Clostridium Difficile by PCR     Status: None   Collection Time: 03/04/17  9:00 AM  Result Value Ref Range Status   Toxigenic C. Difficile by PCR NEGATIVE NEGATIVE Final    Comment: Patient is colonized with non toxigenic C. difficile. May not need treatment unless significant symptoms are present. Performed at [facility], [place] [address] [place], [place] [address]      Radiology Studies: No results found.  Scheduled Meds: . acetaminophen  1,000 mg Oral TID  . cephALEXin  500 mg Oral Q6H  . cholestyramine light  4 g Oral 3 times per day  . citalopram  20 mg Oral Daily  . diphenoxylate-atropine  2 tablet Oral QID  . feeding supplement (GLUCERNA SHAKE)  237 mL Oral BID BM  . ferrous sulfate  325 mg Oral BID WC  . folic acid  1 mg Oral Daily  . gabapentin  300 mg Oral BID  . insulin aspart  0-9 Units Subcutaneous TID WC  . insulin aspart  2 Units Subcutaneous TID WC  . insulin glargine  10 Units Subcutaneous Daily  . lipase/protease/amylase  36,000 Units Oral TID WC  . loperamide  2 mg Oral TID AC  . metroNIDAZOLE  500 mg Oral Q8H   Continuous Infusions: . methocarbamol (ROBAXIN)  IV       LOS: 15 days    Time spent: 25 days     Barton Dubois, MD  [facility] Pager [phone]  If 7PM-7AM, please contact night-coverage [email or web address] Password [facility] 03/13/2017, 6:23 PM

## 2017-03-14 LAB — GLUCOSE, CAPILLARY
Glucose-Capillary: 149 mg/dL — ABNORMAL HIGH (ref 65–99)
Glucose-Capillary: 123 mg/dL — ABNORMAL HIGH (ref 65–99)
Glucose-Capillary: 110 mg/dL — ABNORMAL HIGH (ref 65–99)

## 2017-03-14 MED ORDER — INSULIN GLARGINE 100 UNIT/ML ~~LOC~~ SOLN: 10.0000 [IU] | Freq: Every day | SUBCUTANEOUS | 0 refills | Status: DC

## 2017-03-14 MED ORDER — FOLIC ACID 1 MG PO TABS: 1.0000 mg | ORAL_TABLET | Freq: Every day | ORAL | 0 refills | Status: DC

## 2017-03-14 MED ORDER — GLUCERNA SHAKE PO LIQD: 237.0000 mL | Freq: Two times a day (BID) | ORAL | 0 refills | Status: DC

## 2017-03-14 MED ORDER — INSULIN LISPRO 100 UNIT/ML ~~LOC~~ SOLN: SUBCUTANEOUS | 11 refills | Status: DC

## 2017-03-14 MED ORDER — LOPERAMIDE HCL 2 MG PO CAPS: 2.0000 mg | ORAL_CAPSULE | Freq: Three times a day (TID) | ORAL | 0 refills | Status: DC

## 2017-03-14 MED ORDER — FERROUS SULFATE 325 (65 FE) MG PO TABS: 325.0000 mg | ORAL_TABLET | Freq: Two times a day (BID) | ORAL | 0 refills | Status: DC

## 2017-03-14 MED ORDER — TRAMADOL HCL 50 MG PO TABS: 50.0000 mg | ORAL_TABLET | Freq: Four times a day (QID) | ORAL | 0 refills | Status: DC | PRN

## 2017-03-14 MED ORDER — DIPHENOXYLATE-ATROPINE 2.5-0.025 MG PO TABS: 2.0000 | ORAL_TABLET | Freq: Four times a day (QID) | ORAL | 0 refills | Status: DC

## 2017-03-14 MED ORDER — GABAPENTIN 300 MG PO CAPS: 300.0000 mg | ORAL_CAPSULE | Freq: Two times a day (BID) | ORAL | 0 refills | Status: DC

## 2017-03-14 MED ORDER — METHOCARBAMOL 500 MG PO TABS: 500.0000 mg | ORAL_TABLET | Freq: Three times a day (TID) | ORAL | 0 refills | Status: DC | PRN

## 2017-03-14 MED ORDER — CITALOPRAM HYDROBROMIDE 20 MG PO TABS: 20.0000 mg | ORAL_TABLET | Freq: Every day | ORAL | 0 refills | Status: DC

## 2017-03-14 MED ORDER — CHOLESTYRAMINE LIGHT 4 G PO PACK: 4.0000 g | PACK | Freq: Three times a day (TID) | ORAL | 0 refills | Status: DC

## 2017-03-14 MED ORDER — PANCRELIPASE (LIP-PROT-AMYL) 36000-114000 UNITS PO CPEP: 36000.0000 [IU] | ORAL_CAPSULE | Freq: Three times a day (TID) | ORAL | 0 refills | Status: DC

## 2017-03-14 NOTE — Progress Notes (Signed)
Patient ID: [name], female   DOB: 1990/11/01, 26 y.o.   MRN: [id number]     Progress Note   Subjective    Feeling better, and plan is to go home today  Diarrhea much improved, stool forming   Objective   Vital signs in last 24 hours: Temp:  [98 F (36.7 C)-98.6 F (37 C)] 98 F (36.7 C) (12/15 0547) Pulse Rate:  [91-97] 92 (12/15 0547) Resp:  [16-20] 16 (12/15 0547) BP: (118-132)/(79-94) 132/94 (12/15 0547) SpO2:  [92 %-95 %] 95 % (12/15 0547) Last BM Date: 03/13/17 General:    AA female  in NAD Heart:  Regular rate and rhythm; no murmurs Lungs: Respirations even and unlabored, lungs CTA bilaterally Abdomen:  Soft, nontender and nondistended. Normal bowel sounds. Extremities:  Without edema. Neurologic:  Alert and oriented,  grossly normal neurologically. Psych:  Cooperative. Normal mood and affect.  Intake/Output from previous day: 12/14 0701 - 12/15 0700 In: 360 [P.O.:360] Out: -  Intake/Output this shift: No intake/output data recorded.  Lab Results: No results for input(s): WBC, HGB, HCT, PLT in the last 72 hours. BMET No results for input(s): NA, K, CL, CO2, GLUCOSE, BUN, CREATININE, CALCIUM in the last 72 hours. LFT No results for input(s): PROT, ALBUMIN, AST, ALT, ALKPHOS, BILITOT, BILIDIR, IBILI in the last 72 hours. PT/INR No results for input(s): LABPROT, INR in the last 72 hours.  Studies/Results: No results found.     Assessment / Plan:     #75 26 year old female with insulin-dependent diabetes mellitus, found to have a perirectal abscess on admission, 03/01/2017, this is been drained and debrided Positive blood culture for Lactobacillus, possible contaminant but she will continue 14 day course of metronidazole #2 diarrhea-multifactorial, infectious workup negative. She is significantly improved on Questran. #3 ascites-felt secondary to malnutrition/hypoalbuminemia, status post diagnostic paracentesis-cytology showed reactive mesothelial  cells no atypia #4 poorly controlled type 1 diabetes-DKA on admission, resolved  Plan; patient is being discharged home today Please continue current regimen for diarrhea including cholestyramine and Imodium Patient plans to follow up with Dr. Adriana Mccallum, and is advised to call the office on Monday for follow-up appointment.       Contact  Amy [place], P.A.-C               [phone]      Principal Problem:   MDD (major depressive disorder), recurrent episode, mild (HCC) Active Problems:   Diabetes type 1, uncontrolled ([place])   Protein-calorie malnutrition, severe ([place])   DKA (diabetic ketoacidosis) ([place])   Diarrhea   Lower GI bleed     LOS: 16 days   Amy Esterwood  03/14/2017, 9:58 AM

## 2017-03-14 NOTE — Plan of Care (Signed)
  Activity: Risk for activity intolerance will decrease 03/14/2017 1336 - Progressing by William Dalton, RN   Nutrition: Adequate nutrition will be maintained 03/14/2017 1336 - Progressing by William Dalton, RN   Elimination: Will not experience complications related to bowel motility 03/14/2017 1336 - Progressing by William Dalton, RN

## 2017-03-14 NOTE — Progress Notes (Signed)
03/14/17  1515  Reviewed discharge instructions with patient. Patient verbalized understanding of discharge instructions. Copy of discharge instructions given to patient.

## 2017-03-15 ENCOUNTER — Other Ambulatory Visit: Payer: Self-pay

## 2017-03-15 ENCOUNTER — Encounter (HOSPITAL_COMMUNITY): Payer: Self-pay

## 2017-03-15 ENCOUNTER — Emergency Department (HOSPITAL_COMMUNITY): Payer: 59

## 2017-03-15 ENCOUNTER — Emergency Department (HOSPITAL_COMMUNITY)
Admission: EM | Admit: 2017-03-15 | Discharge: 2017-03-15 | Disposition: A | Discharge status: Home / Self Care | Payer: 59 | Source: Home / Self Care | Attending: Emergency Medicine | Admitting: Emergency Medicine

## 2017-03-15 DIAGNOSIS — F1210 Cannabis abuse, uncomplicated: Secondary | ICD-10-CM | POA: Insufficient documentation

## 2017-03-15 DIAGNOSIS — E109 Type 1 diabetes mellitus without complications: Secondary | ICD-10-CM

## 2017-03-15 DIAGNOSIS — R609 Edema, unspecified: Secondary | ICD-10-CM | POA: Diagnosis not present

## 2017-03-15 DIAGNOSIS — W19XXXA Unspecified fall, initial encounter: Secondary | ICD-10-CM

## 2017-03-15 DIAGNOSIS — Z79899 Other long term (current) drug therapy: Secondary | ICD-10-CM | POA: Insufficient documentation

## 2017-03-15 DIAGNOSIS — R1110 Vomiting, unspecified: Secondary | ICD-10-CM

## 2017-03-15 DIAGNOSIS — R188 Other ascites: Secondary | ICD-10-CM | POA: Diagnosis not present

## 2017-03-15 DIAGNOSIS — R601 Generalized edema: Secondary | ICD-10-CM | POA: Diagnosis not present

## 2017-03-15 DIAGNOSIS — Z91010 Allergy to peanuts: Secondary | ICD-10-CM

## 2017-03-15 DIAGNOSIS — R197 Diarrhea, unspecified: Secondary | ICD-10-CM | POA: Insufficient documentation

## 2017-03-15 DIAGNOSIS — Z794 Long term (current) use of insulin: Secondary | ICD-10-CM

## 2017-03-15 DIAGNOSIS — R1084 Generalized abdominal pain: Secondary | ICD-10-CM

## 2017-03-15 DIAGNOSIS — K50918 Crohn's disease, unspecified, with other complication: Secondary | ICD-10-CM | POA: Diagnosis not present

## 2017-03-15 DIAGNOSIS — I10 Essential (primary) hypertension: Secondary | ICD-10-CM | POA: Insufficient documentation

## 2017-03-15 LAB — COMPREHENSIVE METABOLIC PANEL
ALT: 62 U/L — ABNORMAL HIGH (ref 14–54)
AST: 274 U/L — ABNORMAL HIGH (ref 15–41)
Albumin: 1.8 g/dL — ABNORMAL LOW (ref 3.5–5.0)
Alkaline Phosphatase: 230 U/L — ABNORMAL HIGH (ref 38–126)
Anion gap: 8 (ref 5–15)
BUN: 13 mg/dL (ref 6–20)
CO2: 28 mmol/L (ref 22–32)
Calcium: 8.2 mg/dL — ABNORMAL LOW (ref 8.9–10.3)
Chloride: 104 mmol/L (ref 101–111)
Creatinine, Ser: 0.42 mg/dL — ABNORMAL LOW (ref 0.44–1.00)
GFR calc Af Amer: >60 mL/min (ref >60)
GFR calc non Af Amer: >60 mL/min (ref >60)
Glucose, Bld: 220 mg/dL — ABNORMAL HIGH (ref 65–99)
Potassium: 4.3 mmol/L (ref 3.5–5.1)
Sodium: 140 mmol/L (ref 135–145)
Total Bilirubin: 0.7 mg/dL (ref 0.3–1.2)
Total Protein: 7.3 g/dL (ref 6.5–8.1)

## 2017-03-15 LAB — CBC WITH DIFFERENTIAL/PLATELET
Basophils Absolute: 0.1 10*3/uL (ref 0.0–0.1)
Basophils Relative: 1 %
Eosinophils Absolute: 0 10*3/uL (ref 0.0–0.7)
Eosinophils Relative: 0 %
HCT: 29.3 % — ABNORMAL LOW (ref 36.0–46.0)
Hemoglobin: 9.4 g/dL — ABNORMAL LOW (ref 12.0–15.0)
Lymphocytes Relative: 19 %
Lymphs Abs: 1 10*3/uL (ref 0.7–4.0)
MCH: 30.5 pg (ref 26.0–34.0)
MCHC: 32.1 g/dL (ref 30.0–36.0)
MCV: 95.1 fL (ref 78.0–100.0)
Monocytes Absolute: 0.2 10*3/uL (ref 0.1–1.0)
Monocytes Relative: 5 %
Neutro Abs: 3.7 10*3/uL (ref 1.7–7.7)
Neutrophils Relative %: 75 %
Platelets: 409 10*3/uL — ABNORMAL HIGH (ref 150–400)
RBC: 3.08 MIL/uL — ABNORMAL LOW (ref 3.87–5.11)
RDW: 14.3 % (ref 11.5–15.5)
WBC: 5 10*3/uL (ref 4.0–10.5)

## 2017-03-15 LAB — I-STAT BETA HCG BLOOD, ED (MC, WL, AP ONLY): I-stat hCG, quantitative: <5 m[IU]/mL (ref <5)

## 2017-03-15 LAB — CBG MONITORING, ED: Glucose-Capillary: 196 mg/dL — ABNORMAL HIGH (ref 65–99)

## 2017-03-15 LAB — I-STAT CG4 LACTIC ACID, ED: Lactic Acid, Venous: 0.98 mmol/L (ref 0.5–1.9)

## 2017-03-15 IMAGING — CT CT ABD-PELV W/ CM
1 of 2 series · 14 of 32 positions shown, 19 images · IV contrast (ISOVUE)
Comparison: [DATE]

CLINICAL DATA: Vomiting, diarrhea, weakness. Recent hospitalization
with DKA and buttock abscess treatment.

EXAM:
CT ABDOMEN AND PELVIS WITH CONTRAST
TECHNIQUE: Multidetector CT imaging of the abdomen and pelvis was performed
using the standard protocol following bolus administration of
intravenous contrast.
CONTRAST:  100mL [EW] IOPAMIDOL ([EW]) INJECTION 61%

[Series 2: axial st · axial · 0.92mm/px · z∈[-504,-54]mm · 14 of 102 slices shown, 19 images]
[im 6/102  soft-tissue]
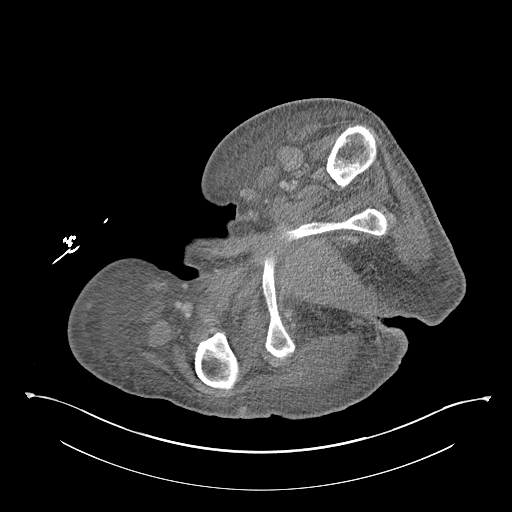
[im 6/102  bone]
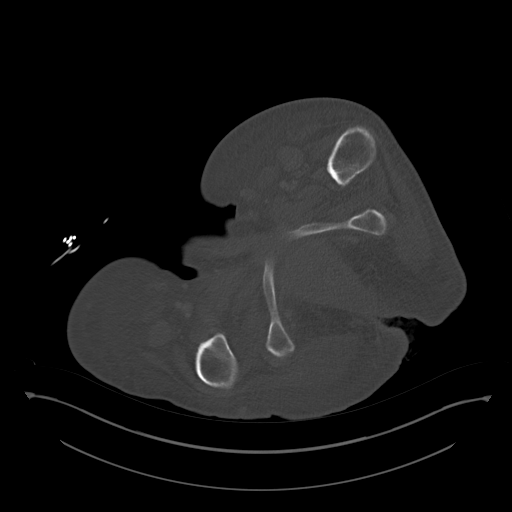
[im 12/102  soft-tissue]
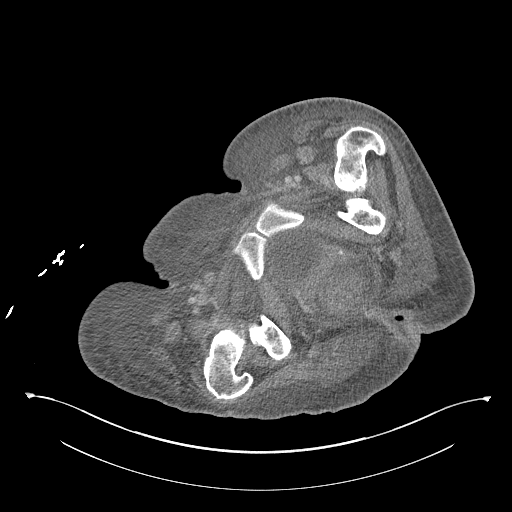
[im 23/102  soft-tissue]
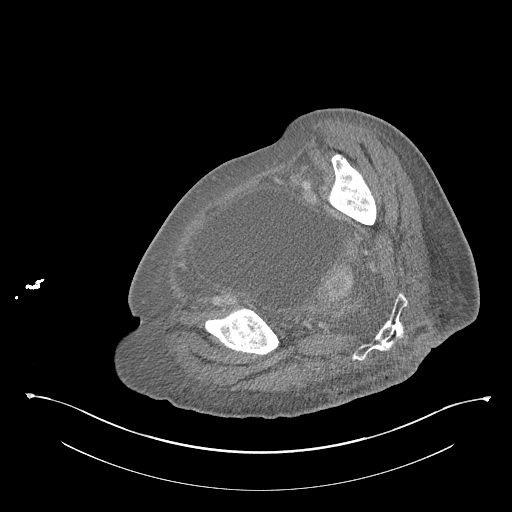
[im 29/102  soft-tissue]
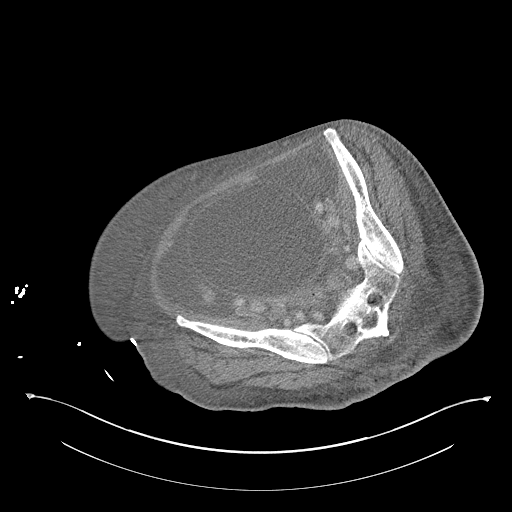
[im 34/102  soft-tissue]
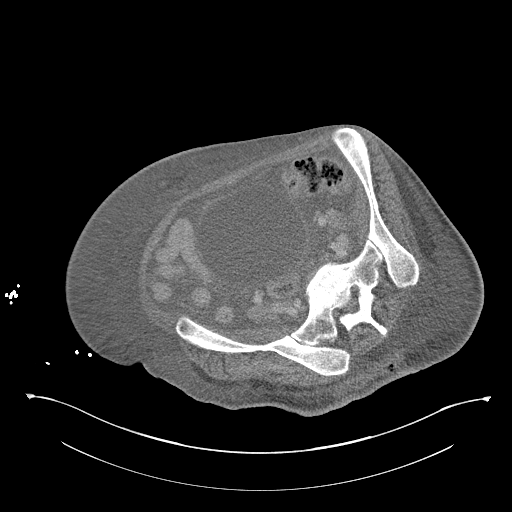
[im 45/102  soft-tissue]
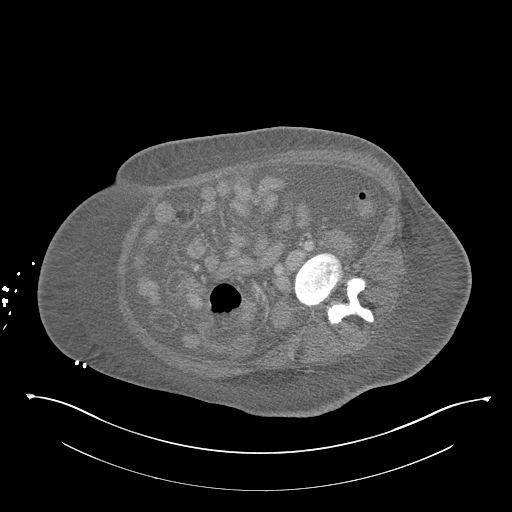
[im 51/102  soft-tissue]
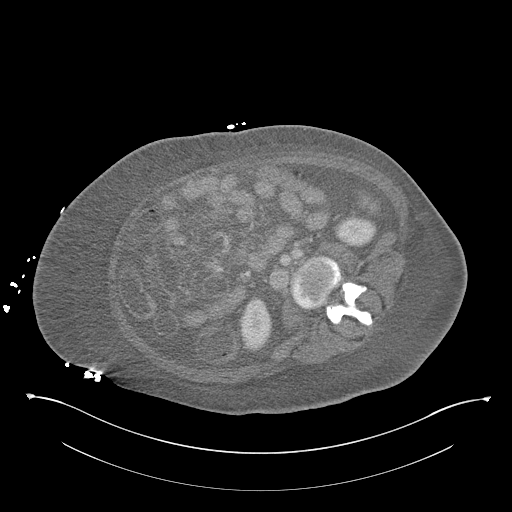
[im 57/102  soft-tissue]
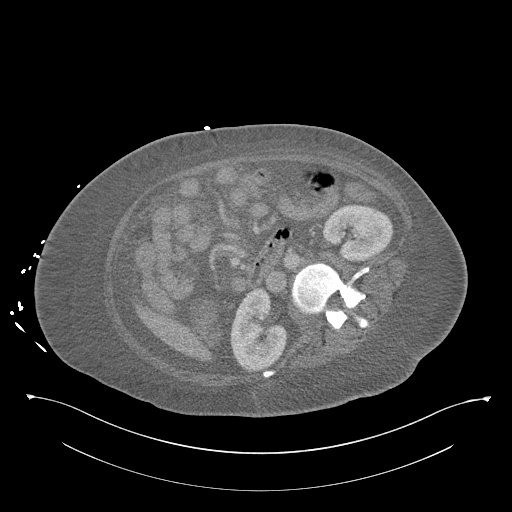
[im 68/102  soft-tissue]
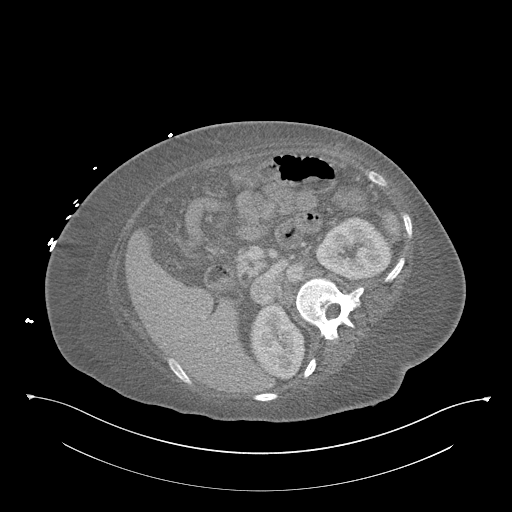
[im 68/102  bone]
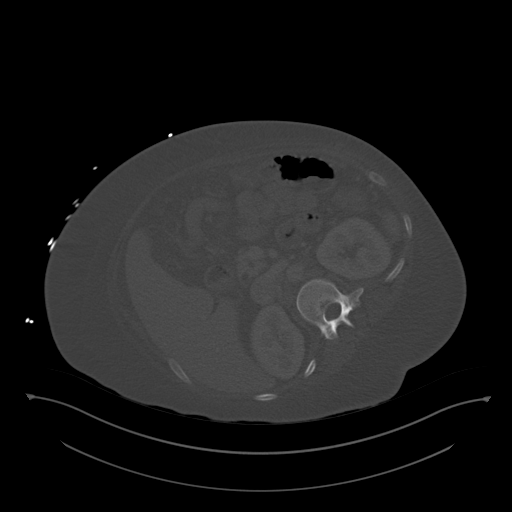
[im 73/102  soft-tissue]
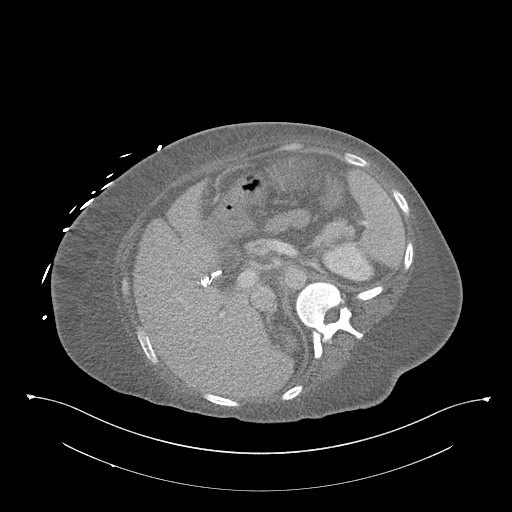
[im 79/102  soft-tissue]
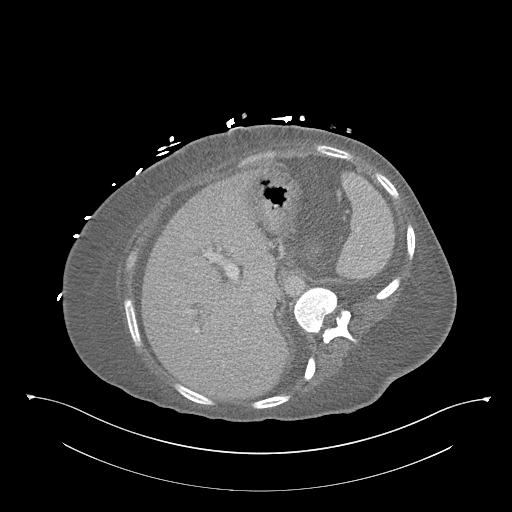
[im 79/102  lung]
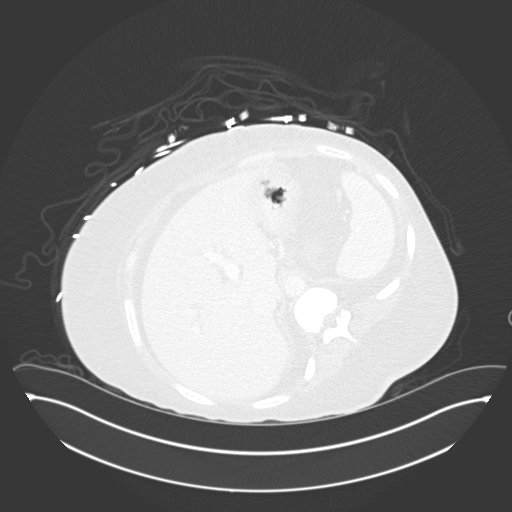
[im 85/102  lung]
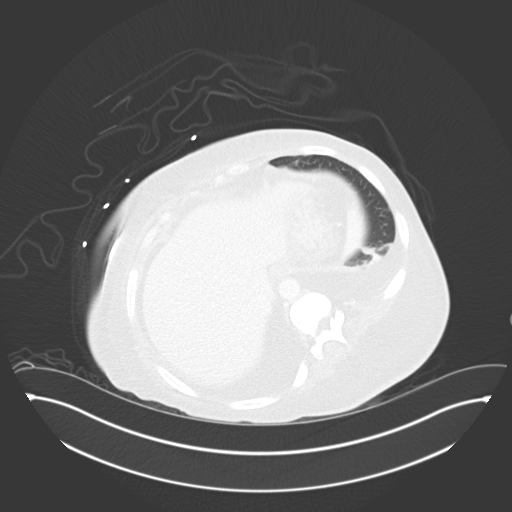
[im 90/102  soft-tissue]
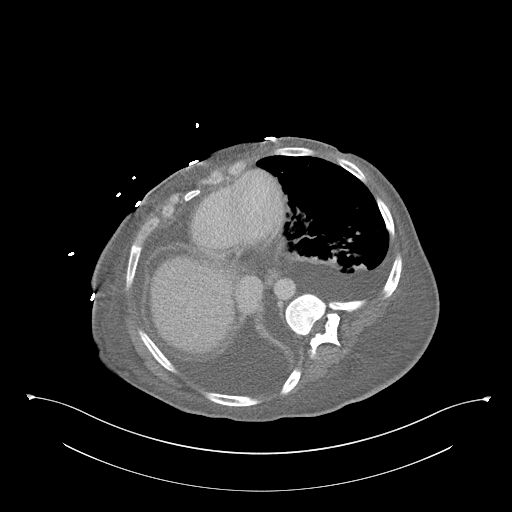
[im 90/102  lung]
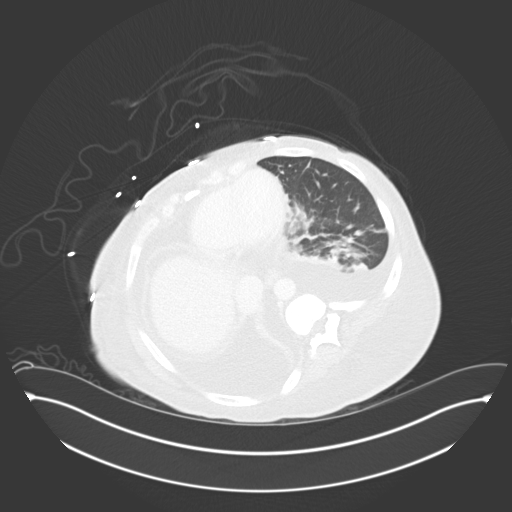
[im 96/102  soft-tissue]
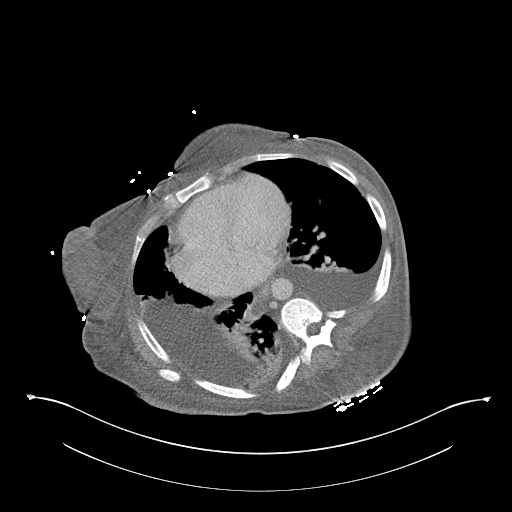
[im 96/102  lung]
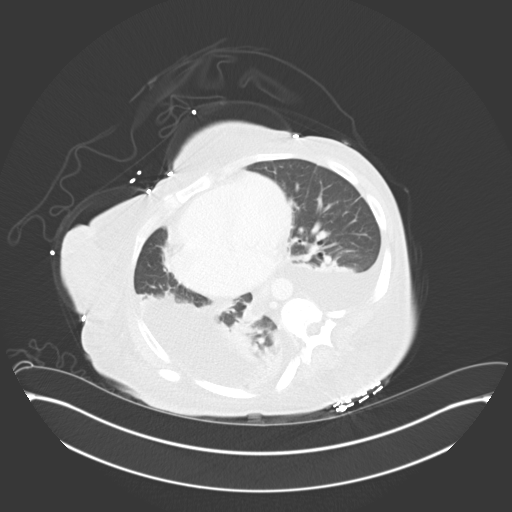

[14 of 32 positions shown; findings below may reference images not displayed]

FINDINGS: Lower chest: Bilateral pleural effusions, slightly enlarged from
before. Bibasilar subsegmental atelectasis.

Hepatobiliary: No focal liver abnormality is seen. Status post
cholecystectomy. No biliary dilatation.

Pancreas: Atrophic appearance.

Spleen: Normal in size without focal abnormality.

Adrenals/Urinary Tract: Adrenal glands are unremarkable. Kidneys are
normal, without renal calculi, focal lesion, or hydronephrosis.
Bladder is unremarkable.

Stomach/Bowel: Stomach is within normal limits. No evidence of
appendicitis. No evidence of bowel wall thickening, distention, or
inflammatory changes.

Vascular/Lymphatic: No significant vascular findings are present. No
enlarged abdominal or pelvic lymph nodes.

Reproductive: Uterus and bilateral adnexa are unremarkable.

Other: Diffuse abdominal wall edema. Improved appearance of
perirectal and right buttock abscess. Small amount of phlegmonous
material and gas remain in the right buttock and intragluteal fold.
No drainable collections seen.

Musculoskeletal: No acute or significant osseous findings.
IMPRESSION: Increased bilateral pleural effusions with associated subsegmental
atelectasis.

Persistent moderate abdominopelvic ascites.

Diffuse abdominal wall edema.

Improved appearance of perirectal and right buttock abscess.

## 2017-03-15 MED ORDER — SODIUM CHLORIDE 0.9 % IV BOLUS (SEPSIS)
1000.0000 mL | Freq: Once | INTRAVENOUS | Status: AC
  Administered 2017-03-15: 1000 mL via INTRAVENOUS

## 2017-03-15 MED ORDER — ONDANSETRON HCL 4 MG/2ML IJ SOLN
4.0000 mg | Freq: Once | INTRAMUSCULAR | Status: AC
  Administered 2017-03-15: 4 mg via INTRAVENOUS
  Filled 2017-03-15: qty 2

## 2017-03-15 MED ORDER — HYDROMORPHONE HCL 1 MG/ML IJ SOLN
1.0000 mg | Freq: Once | INTRAMUSCULAR | Status: AC
  Administered 2017-03-15: 1 mg via INTRAVENOUS
  Filled 2017-03-15: qty 1

## 2017-03-15 MED ORDER — IOPAMIDOL (ISOVUE-300) INJECTION 61%
INTRAVENOUS | Status: AC
  Administered 2017-03-15: 100 mL via INTRAVENOUS
  Filled 2017-03-15: qty 100

## 2017-03-15 MED ORDER — SODIUM CHLORIDE 0.9 % IV SOLN
INTRAVENOUS | Status: DC
Start: 2017-03-15 — End: 2017-03-15
  Administered 2017-03-15: 16:00:00 via INTRAVENOUS

## 2017-03-15 NOTE — ED Provider Notes (Addendum)
[facility] DEPT Provider Note   CSN: [id number] Arrival date & time: 03/15/17  1311     History   Chief Complaint No chief complaint on file.   HPI [name] is a 26 y.o. female.  Patient with recent long-standing hospitalization.  Patient was admitted from November 29 until December 15.  Patient with diabetes and was in DKA complicated by a large buttock abscess which was opened and drained by surgery while in the hospital.  Patient fell off the couch too weak to get up.  Husband or significant other was at work.  Came home patient brought in for evaluation did have some diarrhea.  Patient has wet-to-dry wound changes to her buttocks wound.  Patient had one episode of vomiting.  Has had 2 loose bowel movements.      Past Medical History:  Diagnosis Date  . Anxiety   . Diabetes type 1, uncontrolled ([place]) 11/14/2011   Since age 51  . Fibromyalgia   . Gastroparesis   . Hypertension   . Infection    UTI April 2016    Patient Active Problem List   Diagnosis Date Noted  . MDD (major depressive disorder), recurrent episode, mild ([place]) 02/28/2017  . Diarrhea 02/27/2017  . Lower GI bleed 02/27/2017  . DKA (diabetic ketoacidosis) ([place]) 02/26/2017  . Acute metabolic encephalopathy   . DKA, type 1 ([place]) 06/11/2013  . Orthostatic hypotension dysautonomic syndrome ([place]) 04/28/2013  . Body aches 04/28/2013  . Orthostatic hypotension 04/28/2013  . Diabetes mellitus type 1 ([place]) 04/28/2013  . Protein-calorie malnutrition, severe ([place]) 04/28/2013  . Noncompliance with diabetes treatment 09/01/2012  . Dyspnea 02/28/2012  . Sinus tachycardia 02/28/2012  . Hyperglycemia without ketosis 02/28/2012  . Chest pain 12/04/2011  . Diabetes type 1, uncontrolled ([place]) 11/14/2011  . Hypertension associated with diabetes ([place]) 11/14/2011  . Gastroparesis 11/14/2011  . Hyponatremia 09/19/2011  . Abdominal pain 09/18/2011  . Metabolic alkalosis 23/55/7322    . Diabetic hyperosmolar non-ketotic state ([place]) 09/18/2011  . DIAB W/UNSPEC COMP TYPE I [JUV TYPE] UNCNTRL 06/04/2010  . CONSTIPATION 01/01/2010  . RECTAL PAIN 01/01/2010    Past Surgical History:  Procedure Laterality Date  . ANKLE SURGERY    . CHOLECYSTECTOMY  11/15/2011   Procedure: LAPAROSCOPIC CHOLECYSTECTOMY WITH INTRAOPERATIVE CHOLANGIOGRAM;  Surgeon: [name], MD;  Location: [facility];  Service: General;  Laterality: N/A;  . ESOPHAGOGASTRODUODENOSCOPY  12/03/2011   Procedure: ESOPHAGOGASTRODUODENOSCOPY (EGD);  Surgeon: Beryle Beams, MD;  Location: [facility];  Service: Endoscopy;  Laterality: N/A;  . FLEXIBLE SIGMOIDOSCOPY N/A 03/10/2017   Procedure: FLEXIBLE SIGMOIDOSCOPY;  Surgeon: Carol Ada, MD;  Location: [facility];  Service: Endoscopy;  Laterality: N/A;  . INCISION AND DRAINAGE PERIRECTAL ABSCESS N/A 03/01/2017   Procedure: IRRIGATION AND DEBRIDEMENT PERIRECTAL ABSCESS;  Surgeon: Alphonsa Overall, MD;  Location: [facility];  Service: General;  Laterality: N/A;  . LAPAROSCOPY  11/23/2011   Procedure: LAPAROSCOPY DIAGNOSTIC;  Surgeon: Edward Jolly, MD;  Location: [facility];  Service: General;  Laterality: N/A;    OB History    Gravida Para Term Preterm AB Living   1 1   1   1    SAB TAB Ectopic Multiple Live Births         0 1       Home Medications    Prior to Admission medications   Medication Sig Start Date End Date Taking? Authorizing Provider  cholestyramine light (PREVALITE) 4 g packet Take 1 packet (4 g  total) by mouth 3 (three) times daily. 03/14/17  Yes Lavina Hamman, MD  citalopram (CELEXA) 20 MG tablet Take 1 tablet (20 mg total) by mouth daily. 03/15/17  Yes Lavina Hamman, MD  diphenoxylate-atropine (LOMOTIL) 2.5-0.025 MG tablet Take 2 tablets by mouth 4 (four) times daily. 03/14/17  Yes Lavina Hamman, MD  feeding supplement, GLUCERNA SHAKE, (GLUCERNA SHAKE) LIQD Take 237 mLs by mouth 2 (two) times daily between meals. 03/14/17  Yes Lavina Hamman, MD  ferrous sulfate 325 (65 FE) MG tablet Take 1 tablet (325 mg total) by mouth 2 (two) times daily with a meal. 03/14/17  Yes Lavina Hamman, MD  folic acid (FOLVITE) 1 MG tablet Take 1 tablet (1 mg total) by mouth daily. 03/15/17  Yes Lavina Hamman, MD  gabapentin (NEURONTIN) 300 MG capsule Take 1 capsule (300 mg total) by mouth 2 (two) times daily. 03/14/17  Yes Lavina Hamman, MD  glucose 4 GM chewable tablet Chew 1 tablet (4 g total) by mouth as needed for low blood sugar. 08/10/14  Yes Denney, Rachelle A, CNM  Ibuprofen (MIDOL) 200 MG CAPS Take 400 mg by mouth daily as needed (pain).   Yes [provider]  insulin glargine (LANTUS) 100 UNIT/ML injection Inject 0.1 mLs (10 Units total) into the skin daily. Sometimes has to adjust based on BG levels 03/14/17  Yes Lavina Hamman, MD  insulin lispro (HUMALOG) 100 UNIT/ML injection BG 70 - 120: 0 units BG 121 - 150: 1 unit BG 151 - 200: 2 units BG 201 - 250: 3 units BG 251 - 300: 5 units BG 301 - 350: 7 units BG 351 - 400: 9 units 03/14/17  Yes Lavina Hamman, MD  lipase/protease/amylase (CREON) 36000 UNITS CPEP capsule Take 1 capsule (36,000 Units total) by mouth 3 (three) times daily with meals. 03/14/17  Yes Lavina Hamman, MD  loperamide (IMODIUM) 2 MG capsule Take 1 capsule (2 mg total) by mouth 3 (three) times daily before meals. DO NOT TAKE IT FOR THE DAY YOU DONT HAVE A BOWEL MOVEMENT. 03/14/17  Yes Lavina Hamman, MD  methocarbamol (ROBAXIN) 500 MG tablet Take 1 tablet (500 mg total) by mouth every 8 (eight) hours as needed for muscle spasms. 03/14/17  Yes Lavina Hamman, MD  traMADol (ULTRAM) 50 MG tablet Take 1 tablet (50 mg total) by mouth every 6 (six) hours as needed for moderate pain. 03/14/17  Yes Lavina Hamman, MD  albuterol (PROVENTIL HFA;VENTOLIN HFA) 108 (90 Base) MCG/ACT inhaler Inhale 1-2 puffs into the lungs every 4 (four) hours as needed for wheezing or shortness of breath. Patient not taking:  Reported on 03/15/2017 09/02/16   Street, [place], PA-C  EPINEPHrine (EPIPEN 2-PAK) 0.3 mg/0.3 mL IJ SOAJ injection Inject 0.3 mLs (0.3 mg total) into the muscle once. Patient taking differently: Inject 0.3 mg into the muscle as needed (allergic reaction).  08/10/14   Morene Crocker, CNM    Family History Family History  Problem Relation Age of Onset  . Diabetes Mother   . Hypertension Father     Social History Social History   Tobacco Use  . Smoking status: Never Smoker  . Smokeless tobacco: Never Used  Substance Use Topics  . Alcohol use: Yes    Alcohol/week: 0.0 oz    Comment: rare   . Drug use: Yes    Types: Marijuana    Comment: uncertain when last use was     Allergies  Peanut-containing drug products and Lactose intolerance (gi)   Review of Systems Review of Systems  Constitutional: Negative for fever.  HENT: Negative for congestion.   Eyes: Negative for redness.  Respiratory: Negative for shortness of breath.   Cardiovascular: Negative for chest pain.  Gastrointestinal: Positive for abdominal pain, diarrhea and vomiting.  Genitourinary: Negative for dysuria.  Musculoskeletal: Negative for back pain.  Skin: Positive for wound.  Neurological: Negative for headaches.  Psychiatric/Behavioral: Negative for confusion.     Physical Exam Updated Vital Signs BP (!) 154/116   Pulse (!) 109   Temp 99.1 F (37.3 C) (Oral)   Resp 18   Ht 1.702 m (5' 7" )   Wt 82.1 kg (181 lb)   LMP 02/23/2017   SpO2 95%   BMI 28.35 kg/m   Physical Exam  Constitutional: She is oriented to person, place, and time. She appears well-developed and well-nourished.  HENT:  Head: Normocephalic and atraumatic.  Mouth/Throat: Oropharynx is clear and moist.  Eyes: Conjunctivae and EOM are normal. Pupils are equal, round, and reactive to light.  Neck: Normal range of motion. Neck supple.  Cardiovascular: Normal rate and regular rhythm.  Pulmonary/Chest: Effort normal and breath  sounds normal.  Abdominal: Soft. Bowel sounds are normal. She exhibits distension. There is tenderness. There is no guarding.  Slight distention  Genitourinary:  Genitourinary Comments: Patient with large ischial area wound.  No evidence of any secondary infection.  Healing well.  Cleaned up and packing replaced.  Opening measures approximately 15 cm.  Musculoskeletal: Normal range of motion.  Neurological: She is alert and oriented to person, place, and time. No cranial nerve deficit or sensory deficit. She exhibits normal muscle tone. Coordination normal.  Skin: Skin is warm.  Nursing note and vitals reviewed.    ED Treatments / Results  Labs (all labs ordered are listed, but only abnormal results are displayed) Labs Reviewed  COMPREHENSIVE METABOLIC PANEL - Abnormal; Notable for the following components:      Result Value   Glucose, Bld 220 (*)    Creatinine, Ser 0.42 (*)    Calcium 8.2 (*)    Albumin 1.8 (*)    AST 274 (*)    ALT 62 (*)    Alkaline Phosphatase 230 (*)    All other components within normal limits  CBC WITH DIFFERENTIAL/PLATELET - Abnormal; Notable for the following components:   RBC 3.08 (*)    Hemoglobin 9.4 (*)    HCT 29.3 (*)    Platelets 409 (*)    All other components within normal limits  CBG MONITORING, ED - Abnormal; Notable for the following components:   Glucose-Capillary 196 (*)    All other components within normal limits  URINALYSIS, ROUTINE W REFLEX MICROSCOPIC  LIPASE, BLOOD  I-STAT CG4 LACTIC ACID, ED  I-STAT BETA HCG BLOOD, ED ([facility], [facility], [facility] ONLY)  I-STAT CG4 LACTIC ACID, ED    EKG  EKG Interpretation  Date/Time:  Sunday March 15 2017 15:03:20 EST Ventricular Rate:  109 PR Interval:    QRS Duration: 71 QT Interval:  320 QTC Calculation: 431 R Axis:   69 Text Interpretation:  Sinus tachycardia Low voltage, extremity and precordial leads Confirmed by Fredia Sorrow [phone]) on 03/15/2017 3:11:08 PM       Radiology No  results found.  Procedures Procedures (including critical care time)  Medications Ordered in ED Medications  0.9 %  sodium chloride infusion ( Intravenous New Bag/Given 03/15/17 1543)  sodium chloride 0.9 % bolus 1,000  mL (1,000 mLs Intravenous New Bag/Given 03/15/17 1542)  ondansetron (ZOFRAN) injection 4 mg (4 mg Intravenous Given 03/15/17 1545)  HYDROmorphone (DILAUDID) injection 1 mg (1 mg Intravenous Given 03/15/17 1545)  iopamidol (ISOVUE-300) 61 % injection (100 mLs Intravenous Contrast Given 03/15/17 1617)     Initial Impression / Assessment and Plan / ED Course  I have reviewed the triage vital signs and the nursing notes.  Pertinent labs & imaging results that were available during my care of the patient were reviewed by me and considered in my medical decision making (see chart for details).    Patient's buttock wound appears to be healing well.  Patient basic labs and blood sugar here without evidence of any significant hyperglycemia or evidence of diabetic ketoacidosis.  Patient is already on antidiarrheal medicines.  The only concern was her LFTs are elevated significantly compared to December 7.  May be related to some of her medications.  Also having some abdominal discomfort.  CT abdomen ordered.  If no acute findings patient can be discharged home with follow-up.  Possibly patient has a bit of a chronic pain issue.  Significantly improved here with 1 dose of hydromorphone.   Final Clinical Impressions(s) / ED Diagnoses   Final diagnoses:  Fall, initial encounter  Generalized abdominal pain  Vomiting and diarrhea    ED Discharge Orders    None       Fredia Sorrow, MD 03/15/17 1021    Fredia Sorrow, MD 03/15/17 1620

## 2017-03-15 NOTE — ED Notes (Signed)
Pt ambulatory and independent at discharge.  Verbalized understanding of discharge instructions 

## 2017-03-15 NOTE — ED Triage Notes (Signed)
Patient presented to ed by ems with c/o dizziness. Pt state she was siting in couch and fell to the follow and vomited. Patient was discharged yesterday from the hospital.  CBG 252 per ems.

## 2017-03-15 NOTE — Discharge Instructions (Signed)
Follow-up with your family doctor this week for continued following of your mild elevation of liver studies

## 2017-03-15 NOTE — ED Notes (Signed)
Bed: WA01 Expected date: 03/15/17 Expected time: 1:13 PM Means of arrival: Ambulance Comments: Dizzy, hyperglycemia

## 2017-03-16 ENCOUNTER — Inpatient Hospital Stay (HOSPITAL_COMMUNITY)
Admission: EM | Admit: 2017-03-16 | Discharge: 2017-03-21 | DRG: 385 | Disposition: A | Discharge status: Home / Self Care | Payer: 59 | Attending: Internal Medicine | Admitting: Internal Medicine

## 2017-03-16 ENCOUNTER — Encounter (HOSPITAL_COMMUNITY): Payer: Self-pay | Admitting: Family Medicine

## 2017-03-16 DIAGNOSIS — I10 Essential (primary) hypertension: Secondary | ICD-10-CM | POA: Diagnosis present

## 2017-03-16 DIAGNOSIS — Z538 Procedure and treatment not carried out for other reasons: Secondary | ICD-10-CM | POA: Diagnosis present

## 2017-03-16 DIAGNOSIS — J90 Pleural effusion, not elsewhere classified: Secondary | ICD-10-CM | POA: Diagnosis present

## 2017-03-16 DIAGNOSIS — Z833 Family history of diabetes mellitus: Secondary | ICD-10-CM | POA: Diagnosis not present

## 2017-03-16 DIAGNOSIS — Z6828 Body mass index (BMI) 28.0-28.9, adult: Secondary | ICD-10-CM | POA: Diagnosis not present

## 2017-03-16 DIAGNOSIS — IMO0001 Reserved for inherently not codable concepts without codable children: Secondary | ICD-10-CM | POA: Diagnosis present

## 2017-03-16 DIAGNOSIS — Z79899 Other long term (current) drug therapy: Secondary | ICD-10-CM

## 2017-03-16 DIAGNOSIS — D638 Anemia in other chronic diseases classified elsewhere: Secondary | ICD-10-CM | POA: Diagnosis present

## 2017-03-16 DIAGNOSIS — Z794 Long term (current) use of insulin: Secondary | ICD-10-CM | POA: Diagnosis not present

## 2017-03-16 DIAGNOSIS — M797 Fibromyalgia: Secondary | ICD-10-CM | POA: Diagnosis present

## 2017-03-16 DIAGNOSIS — F329 Major depressive disorder, single episode, unspecified: Secondary | ICD-10-CM | POA: Diagnosis present

## 2017-03-16 DIAGNOSIS — E1142 Type 2 diabetes mellitus with diabetic polyneuropathy: Secondary | ICD-10-CM | POA: Diagnosis present

## 2017-03-16 DIAGNOSIS — Z8249 Family history of ischemic heart disease and other diseases of the circulatory system: Secondary | ICD-10-CM

## 2017-03-16 DIAGNOSIS — Z9049 Acquired absence of other specified parts of digestive tract: Secondary | ICD-10-CM | POA: Diagnosis not present

## 2017-03-16 DIAGNOSIS — R0602 Shortness of breath: Secondary | ICD-10-CM | POA: Diagnosis not present

## 2017-03-16 DIAGNOSIS — K50918 Crohn's disease, unspecified, with other complication: Secondary | ICD-10-CM | POA: Diagnosis present

## 2017-03-16 DIAGNOSIS — E119 Type 2 diabetes mellitus without complications: Secondary | ICD-10-CM

## 2017-03-16 DIAGNOSIS — Z9889 Other specified postprocedural states: Secondary | ICD-10-CM

## 2017-03-16 DIAGNOSIS — O0991 Supervision of high risk pregnancy, unspecified, first trimester: Secondary | ICD-10-CM

## 2017-03-16 DIAGNOSIS — Z91018 Allergy to other foods: Secondary | ICD-10-CM

## 2017-03-16 DIAGNOSIS — Z91010 Allergy to peanuts: Secondary | ICD-10-CM

## 2017-03-16 DIAGNOSIS — Z791 Long term (current) use of non-steroidal anti-inflammatories (NSAID): Secondary | ICD-10-CM

## 2017-03-16 DIAGNOSIS — Z79891 Long term (current) use of opiate analgesic: Secondary | ICD-10-CM

## 2017-03-16 DIAGNOSIS — E8770 Fluid overload, unspecified: Secondary | ICD-10-CM | POA: Diagnosis present

## 2017-03-16 DIAGNOSIS — E1165 Type 2 diabetes mellitus with hyperglycemia: Secondary | ICD-10-CM | POA: Diagnosis present

## 2017-03-16 DIAGNOSIS — E43 Unspecified severe protein-calorie malnutrition: Secondary | ICD-10-CM | POA: Diagnosis present

## 2017-03-16 DIAGNOSIS — Z91011 Allergy to milk products: Secondary | ICD-10-CM | POA: Diagnosis not present

## 2017-03-16 DIAGNOSIS — E785 Hyperlipidemia, unspecified: Secondary | ICD-10-CM | POA: Diagnosis present

## 2017-03-16 DIAGNOSIS — R609 Edema, unspecified: Secondary | ICD-10-CM | POA: Diagnosis not present

## 2017-03-16 DIAGNOSIS — F419 Anxiety disorder, unspecified: Secondary | ICD-10-CM | POA: Diagnosis present

## 2017-03-16 DIAGNOSIS — E440 Moderate protein-calorie malnutrition: Secondary | ICD-10-CM

## 2017-03-16 DIAGNOSIS — R188 Other ascites: Secondary | ICD-10-CM | POA: Diagnosis present

## 2017-03-16 DIAGNOSIS — E10649 Type 1 diabetes mellitus with hypoglycemia without coma: Secondary | ICD-10-CM | POA: Diagnosis present

## 2017-03-16 DIAGNOSIS — R601 Generalized edema: Secondary | ICD-10-CM | POA: Diagnosis present

## 2017-03-16 DIAGNOSIS — IMO0002 Reserved for concepts with insufficient information to code with codable children: Secondary | ICD-10-CM | POA: Diagnosis present

## 2017-03-16 DIAGNOSIS — I340 Nonrheumatic mitral (valve) insufficiency: Secondary | ICD-10-CM | POA: Diagnosis not present

## 2017-03-16 LAB — URINALYSIS, ROUTINE W REFLEX MICROSCOPIC
Bilirubin Urine: NEGATIVE
Glucose, UA: NEGATIVE mg/dL
Ketones, ur: NEGATIVE mg/dL
Nitrite: NEGATIVE
Protein, ur: NEGATIVE mg/dL
Specific Gravity, Urine: 1.018 (ref 1.005–1.030)
pH: 6 (ref 5.0–8.0)

## 2017-03-16 LAB — CBC WITH DIFFERENTIAL/PLATELET
Basophils Absolute: 0.1 10*3/uL (ref 0.0–0.1)
Basophils Relative: 2 %
Eosinophils Absolute: 0.1 10*3/uL (ref 0.0–0.7)
Eosinophils Relative: 2 %
HCT: 27.6 % — ABNORMAL LOW (ref 36.0–46.0)
Hemoglobin: 8.8 g/dL — ABNORMAL LOW (ref 12.0–15.0)
Lymphocytes Relative: 35 %
Lymphs Abs: 2 10*3/uL (ref 0.7–4.0)
MCH: 30.3 pg (ref 26.0–34.0)
MCHC: 31.9 g/dL (ref 30.0–36.0)
MCV: 95.2 fL (ref 78.0–100.0)
Monocytes Absolute: 0.6 10*3/uL (ref 0.1–1.0)
Monocytes Relative: 12 %
Neutro Abs: 2.8 10*3/uL (ref 1.7–7.7)
Neutrophils Relative %: 49 %
Platelets: 417 10*3/uL — ABNORMAL HIGH (ref 150–400)
RBC: 2.9 MIL/uL — ABNORMAL LOW (ref 3.87–5.11)
RDW: 14.2 % (ref 11.5–15.5)
WBC: 5.6 10*3/uL (ref 4.0–10.5)

## 2017-03-16 LAB — CBG MONITORING, ED
Glucose-Capillary: 64 mg/dL — ABNORMAL LOW (ref 65–99)
Glucose-Capillary: 65 mg/dL (ref 65–99)
Glucose-Capillary: 83 mg/dL (ref 65–99)
Glucose-Capillary: 74 mg/dL (ref 65–99)

## 2017-03-16 LAB — COMPREHENSIVE METABOLIC PANEL
ALT: 33 U/L (ref 14–54)
AST: 40 U/L (ref 15–41)
Albumin: 1.8 g/dL — ABNORMAL LOW (ref 3.5–5.0)
Alkaline Phosphatase: 156 U/L — ABNORMAL HIGH (ref 38–126)
Anion gap: 7 (ref 5–15)
BUN: 8 mg/dL (ref 6–20)
CO2: 28 mmol/L (ref 22–32)
Calcium: 8.2 mg/dL — ABNORMAL LOW (ref 8.9–10.3)
Chloride: 108 mmol/L (ref 101–111)
Creatinine, Ser: 0.33 mg/dL — ABNORMAL LOW (ref 0.44–1.00)
GFR calc Af Amer: >60 mL/min (ref >60)
GFR calc non Af Amer: >60 mL/min (ref >60)
Glucose, Bld: 79 mg/dL (ref 65–99)
Potassium: 3.5 mmol/L (ref 3.5–5.1)
Sodium: 143 mmol/L (ref 135–145)
Total Bilirubin: 0.2 mg/dL — ABNORMAL LOW (ref 0.3–1.2)
Total Protein: 7 g/dL (ref 6.5–8.1)

## 2017-03-16 LAB — I-STAT CG4 LACTIC ACID, ED: Lactic Acid, Venous: 1.69 mmol/L (ref 0.5–1.9)

## 2017-03-16 MED ORDER — FUROSEMIDE 10 MG/ML IJ SOLN
40.0000 mg | Freq: Once | INTRAMUSCULAR | Status: AC
  Administered 2017-03-17: 40 mg via INTRAVENOUS
  Filled 2017-03-16: qty 4

## 2017-03-16 NOTE — ED Triage Notes (Addendum)
Patient is from home and reports she was discharged on Saturday for DKA, crohn's, and abscess to buttocks. Also, she was seen in [facility] ED on 12/16 for dizziness but discharged. Today, she is complaining of generalized swelling and increased abscess pain on her buttocks.

## 2017-03-16 NOTE — ED Notes (Signed)
Pt given orange juice by Wells Guiles, RN due to [facility] of 65

## 2017-03-16 NOTE — ED Notes (Signed)
Patient was given crackers, peanut butter, and cranberry juice by Myriam Jacobson, NT for a blood sugar of 64mg /dL despite orange juice earlier for a blood sugar of 65mg /dL.

## 2017-03-16 NOTE — ED Provider Notes (Signed)
[facility]-EMERGENCY DEPT Provider Note   CSN: [id number] Arrival date & time: 03/16/17  1944     History   Chief Complaint Chief Complaint  Patient presents with  . Facial Swelling  . Abscess    HPI [name] is a 26 y.o. female to the emergency department for generalized swelling.  She is a past medical history of dependent type 1 diabetes, newly diagnosed Crohn's disease.  Patient states that she is so swollen from head to toe that she cannot fit in any of her clothing or walk.  On 1129 her records show that she weighed 115 pounds.  She currently weighs 180 pounds.  The patient states that she cannot fit in any of her clothing, she is having difficulty breathing when lying flat.  She denies fevers.  She does complain of generalized pain due to the amount of stretching of her skin.  She has significant swelling of her abdomen and states that she looks like she did when she was pregnant.  Review of her chart shows that she has had increasing elevation in her LFTs and markedly low albumin level levels.  This is concerning for possible nephrotic syndrome.  Patient denies fevers, chills she endorses orthopnea and PND.  She denies chest pain.  She states that her swelling has now progressed into her face and her eyes were swollen shut this morning.  HPI  Past Medical History:  Diagnosis Date  . Anxiety   . Crohn's disease (HCC)   . Diabetes type 1, uncontrolled (HCC) 11/14/2011   Since age 55  . Fibromyalgia   . Gastroparesis   . Hypertension   . Infection    UTI April 2016    Patient Active Problem List   Diagnosis Date Noted  . 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 Diarrhea 02/27/2017  . Lower GI bleed 02/27/2017  . DKA (diabetic ketoacidosis) (HCC) 02/26/2017  . Acute metabolic encephalopathy   . DKA, type 1 (HCC) 06/11/2013  . Orthostatic hypotension dysautonomic syndrome (HCC) 04/28/2013  . Body aches  04/28/2013  . Orthostatic hypotension 04/28/2013  . Diabetes mellitus type 1 (HCC) 04/28/2013  . Protein-calorie malnutrition, severe (HCC) 04/28/2013  . Noncompliance with diabetes treatment 09/01/2012  . Dyspnea 02/28/2012  . Sinus tachycardia 02/28/2012  . Hyperglycemia without ketosis 02/28/2012  . Chest pain 12/04/2011  . Diabetes type 1, uncontrolled (HCC) 11/14/2011  . Hypertension associated with diabetes (HCC) 11/14/2011  . Gastroparesis 11/14/2011  . Hyponatremia 09/19/2011  . Abdominal pain 09/18/2011  . Metabolic alkalosis 09/18/2011  . Diabetic hyperosmolar non-ketotic state (HCC) 09/18/2011  . DIAB W/UNSPEC COMP TYPE I [JUV TYPE] UNCNTRL 06/04/2010  . CONSTIPATION 01/01/2010  . RECTAL PAIN 01/01/2010    Past Surgical History:  Procedure Laterality Date  . ANKLE SURGERY    . CHOLECYSTECTOMY  11/15/2011   Procedure: LAPAROSCOPIC CHOLECYSTECTOMY WITH INTRAOPERATIVE CHOLANGIOGRAM;  Surgeon: [name], MD;  Location: [facility];  Service: General;  Laterality: N/A;  . ESOPHAGOGASTRODUODENOSCOPY  12/03/2011   Procedure: ESOPHAGOGASTRODUODENOSCOPY (EGD);  Surgeon: Theda Belfast, MD;  Location: [facility];  Service: Endoscopy;  Laterality: N/A;  . FLEXIBLE SIGMOIDOSCOPY N/A 03/10/2017   Procedure: FLEXIBLE SIGMOIDOSCOPY;  Surgeon: Jeani Hawking, MD;  Location: [facility];  Service: Endoscopy;  Laterality: N/A;  . INCISION AND DRAINAGE PERIRECTAL ABSCESS N/A 03/01/2017   Procedure: IRRIGATION AND DEBRIDEMENT PERIRECTAL ABSCESS;  Surgeon: Ovidio Kin, MD;  Location: [facility];  Service: General;  Laterality: N/A;  .  LAPAROSCOPY  11/23/2011   Procedure: LAPAROSCOPY DIAGNOSTIC;  Surgeon: Mariella SaaBenjamin T Hoxworth, MD;  Location: [facility];  Service: General;  Laterality: N/A;    OB History    Gravida Para Term Preterm AB Living   1 1   1   1    SAB TAB Ectopic Multiple Live Births         0 1       Home Medications    Prior to Admission medications   Medication Sig Start Date  End Date Taking? Authorizing Provider  albuterol (PROVENTIL HFA;VENTOLIN HFA) 108 (90 Base) MCG/ACT inhaler Inhale 1-2 puffs into the lungs every 4 (four) hours as needed for wheezing or shortness of breath. Patient not taking: Reported on 03/15/2017 09/02/16   Street, CaldwellMercedes, PA-C  cholestyramine light (PREVALITE) 4 g packet Take 1 packet (4 g total) by mouth 3 (three) times daily. 03/14/17   Rolly SalterPatel, Pranav M, MD  citalopram (CELEXA) 20 MG tablet Take 1 tablet (20 mg total) by mouth daily. 03/15/17   Rolly SalterPatel, Pranav M, MD  diphenoxylate-atropine (LOMOTIL) 2.5-0.025 MG tablet Take 2 tablets by mouth 4 (four) times daily. 03/14/17   Rolly SalterPatel, Pranav M, MD  EPINEPHrine (EPIPEN 2-PAK) 0.3 mg/0.3 mL IJ SOAJ injection Inject 0.3 mLs (0.3 mg total) into the muscle once. Patient taking differently: Inject 0.3 mg into the muscle as needed (allergic reaction).  08/10/14   Orvilla Cornwallenney, Rachelle A, CNM  feeding supplement, GLUCERNA SHAKE, (GLUCERNA SHAKE) LIQD Take 237 mLs by mouth 2 (two) times daily between meals. 03/14/17   Rolly SalterPatel, Pranav M, MD  ferrous sulfate 325 (65 FE) MG tablet Take 1 tablet (325 mg total) by mouth 2 (two) times daily with a meal. 03/14/17   Rolly SalterPatel, Pranav M, MD  folic acid (FOLVITE) 1 MG tablet Take 1 tablet (1 mg total) by mouth daily. 03/15/17   Rolly SalterPatel, Pranav M, MD  gabapentin (NEURONTIN) 300 MG capsule Take 1 capsule (300 mg total) by mouth 2 (two) times daily. 03/14/17   Rolly SalterPatel, Pranav M, MD  glucose 4 GM chewable tablet Chew 1 tablet (4 g total) by mouth as needed for low blood sugar. 08/10/14   Orvilla Cornwallenney, Rachelle A, CNM  Ibuprofen (MIDOL) 200 MG CAPS Take 400 mg by mouth daily as needed (pain).    [provider]  insulin glargine (LANTUS) 100 UNIT/ML injection Inject 0.1 mLs (10 Units total) into the skin daily. Sometimes has to adjust based on BG levels 03/14/17   Rolly SalterPatel, Pranav M, MD  insulin lispro (HUMALOG) 100 UNIT/ML injection BG 70 - 120: 0 units BG 121 - 150: 1 unit BG 151 - 200: 2  units BG 201 - 250: 3 units BG 251 - 300: 5 units BG 301 - 350: 7 units BG 351 - 400: 9 units 03/14/17   Rolly SalterPatel, Pranav M, MD  lipase/protease/amylase (CREON) 36000 UNITS CPEP capsule Take 1 capsule (36,000 Units total) by mouth 3 (three) times daily with meals. 03/14/17   Rolly SalterPatel, Pranav M, MD  loperamide (IMODIUM) 2 MG capsule Take 1 capsule (2 mg total) by mouth 3 (three) times daily before meals. DO NOT TAKE IT FOR THE DAY YOU DONT HAVE A BOWEL MOVEMENT. 03/14/17   Rolly SalterPatel, Pranav M, MD  methocarbamol (ROBAXIN) 500 MG tablet Take 1 tablet (500 mg total) by mouth every 8 (eight) hours as needed for muscle spasms. 03/14/17   Rolly SalterPatel, Pranav M, MD  traMADol (ULTRAM) 50 MG tablet Take 1 tablet (50 mg total) by mouth every 6 (six)  hours as needed for moderate pain. 03/14/17   Rolly Salter, MD    Family History Family History  Problem Relation Age of Onset  . Diabetes Mother   . Hypertension Father     Social History Social History   Tobacco Use  . Smoking status: Never Smoker  . Smokeless tobacco: Never Used  Substance Use Topics  . Alcohol use: Yes    Alcohol/week: 0.0 oz    Comment: Once every couple of months   . Drug use: No    Comment: Previous use     Allergies   Peanut-containing drug products and Lactose intolerance (gi)   Review of Systems Review of Systems  Ten systems reviewed and are negative for acute change, except as noted in the HPI.   Physical Exam Updated Vital Signs BP (!) 125/93 (BP Location: Left Arm)   Pulse 97   Temp 98.4 F (36.9 C) (Oral)   Resp (!) 9   Ht 5\' 7"  (1.702 m)   Wt 81.6 kg (180 lb)   LMP 02/23/2017   SpO2 94%   BMI 28.19 kg/m   Physical Exam  Constitutional: She is oriented to person, place, and time. She appears well-developed and well-nourished.  HENT:  Head: Normocephalic and atraumatic.  Eyes: EOM are normal. Pupils are equal, round, and reactive to light. No scleral icterus.  Neck: Normal range of motion. Neck supple.    Cardiovascular: Normal rate and regular rhythm.  No murmur heard. Pulmonary/Chest: Effort normal and breath sounds normal.  Abdominal: Soft. Bowel sounds are normal. She exhibits distension.  Diffuse tenderness and distension  Musculoskeletal: She exhibits edema.  Marked Edema/ Anasarca with pitting edema up to the mid-abdomen  Neurological: She is alert and oriented to person, place, and time.  Nursing note and vitals reviewed.    ED Treatments / Results  Labs (all labs ordered are listed, but only abnormal results are displayed) Labs Reviewed  CBC WITH DIFFERENTIAL/PLATELET - Abnormal; Notable for the following components:      Result Value   RBC 2.90 (*)    Hemoglobin 8.8 (*)    HCT 27.6 (*)    Platelets 417 (*)    All other components within normal limits  CBG MONITORING, ED - Abnormal; Notable for the following components:   Glucose-Capillary 64 (*)    All other components within normal limits  COMPREHENSIVE METABOLIC PANEL  URINALYSIS, ROUTINE W REFLEX MICROSCOPIC  CBG MONITORING, ED  CBG MONITORING, ED  I-STAT CG4 LACTIC ACID, ED    EKG  EKG Interpretation None       Radiology Ct Abdomen Pelvis W Contrast  Result Date: 03/15/2017 CLINICAL DATA:  Vomiting, diarrhea, weakness. Recent hospitalization with DKA and buttock abscess treatment. EXAM: CT ABDOMEN AND PELVIS WITH CONTRAST TECHNIQUE: Multidetector CT imaging of the abdomen and pelvis was performed using the standard protocol following bolus administration of intravenous contrast. CONTRAST:  ISOVUE-300 IOPAMIDOL (ISOVUE-300) INJECTION 61% COMPARISON:  02/28/2017 FINDINGS: Lower chest: Bilateral pleural effusions, slightly enlarged from before. Bibasilar subsegmental atelectasis. Hepatobiliary: No focal liver abnormality is seen. Status post cholecystectomy. No biliary dilatation. Pancreas: Atrophic appearance. Spleen: Normal in size without focal abnormality. Adrenals/Urinary Tract: Adrenal glands are  unremarkable. Kidneys are normal, without renal calculi, focal lesion, or hydronephrosis. Bladder is unremarkable. Stomach/Bowel: Stomach is within normal limits. No evidence of appendicitis. No evidence of bowel wall thickening, distention, or inflammatory changes. Vascular/Lymphatic: No significant vascular findings are present. No enlarged abdominal or pelvic lymph nodes. Reproductive: Uterus  and bilateral adnexa are unremarkable. Other: Diffuse abdominal wall edema. Improved appearance of perirectal and right buttock abscess. Small amount of phlegmonous material and gas remain in the right buttock and intragluteal fold. No drainable collections seen. Musculoskeletal: No acute or significant osseous findings. IMPRESSION: Increased bilateral pleural effusions with associated subsegmental atelectasis. Persistent moderate abdominopelvic ascites. Diffuse abdominal wall edema. Improved appearance of perirectal and right buttock abscess. Electronically Signed   By: Ted Mcalpine M.D.   On: 03/15/2017 16:38    Procedures Procedures (including critical care time)  Medications Ordered in ED Medications - No data to display   Initial Impression / Assessment and Plan / ED Course  I have reviewed the triage vital signs and the nursing notes.  Pertinent labs & imaging results that were available during my care of the patient were reviewed by me and considered in my medical decision making (see chart for details).     Patient with anasarca. She does not appear to have nephrotic syndrome as she does not have any protein in her urine. The patient does however have low albumin and is likely third spacing. With 60 pound weight gain in the past 15 days, she will need admission for diuresis. I have started her on40 of IV lasix.   Final Clinical Impressions(s) / ED Diagnoses   Final diagnoses:  Anasarca    ED Discharge Orders    None       Arthor Captain, PA-C 03/17/17 0033    Linwood Dibbles,  MD 03/20/17 [phone]

## 2017-03-17 ENCOUNTER — Other Ambulatory Visit: Payer: Self-pay

## 2017-03-17 ENCOUNTER — Inpatient Hospital Stay (HOSPITAL_COMMUNITY): Payer: 59

## 2017-03-17 ENCOUNTER — Encounter (HOSPITAL_COMMUNITY): Payer: Self-pay | Admitting: Internal Medicine

## 2017-03-17 DIAGNOSIS — I340 Nonrheumatic mitral (valve) insufficiency: Secondary | ICD-10-CM

## 2017-03-17 DIAGNOSIS — R601 Generalized edema: Secondary | ICD-10-CM

## 2017-03-17 LAB — BASIC METABOLIC PANEL
Anion gap: 7 (ref 5–15)
BUN: 8 mg/dL (ref 6–20)
CO2: 30 mmol/L (ref 22–32)
Calcium: 8.2 mg/dL — ABNORMAL LOW (ref 8.9–10.3)
Chloride: 107 mmol/L (ref 101–111)
Creatinine, Ser: 0.34 mg/dL — ABNORMAL LOW (ref 0.44–1.00)
GFR calc Af Amer: >60 mL/min (ref >60)
GFR calc non Af Amer: >60 mL/min (ref >60)
Glucose, Bld: 113 mg/dL — ABNORMAL HIGH (ref 65–99)
Potassium: 3.4 mmol/L — ABNORMAL LOW (ref 3.5–5.1)
Sodium: 144 mmol/L (ref 135–145)

## 2017-03-17 LAB — GLUCOSE, CAPILLARY
Glucose-Capillary: 64 mg/dL — ABNORMAL LOW (ref 65–99)
Glucose-Capillary: 133 mg/dL — ABNORMAL HIGH (ref 65–99)
Glucose-Capillary: 63 mg/dL — ABNORMAL LOW (ref 65–99)
Glucose-Capillary: 82 mg/dL (ref 65–99)
Glucose-Capillary: 130 mg/dL — ABNORMAL HIGH (ref 65–99)
Glucose-Capillary: 154 mg/dL — ABNORMAL HIGH (ref 65–99)
Glucose-Capillary: 162 mg/dL — ABNORMAL HIGH (ref 65–99)

## 2017-03-17 LAB — ECHOCARDIOGRAM COMPLETE
Height: 67 in
Weight: 2792 oz

## 2017-03-17 LAB — HEPATIC FUNCTION PANEL
ALT: 31 U/L (ref 14–54)
AST: 44 U/L — ABNORMAL HIGH (ref 15–41)
Albumin: 1.8 g/dL — ABNORMAL LOW (ref 3.5–5.0)
Alkaline Phosphatase: 146 U/L — ABNORMAL HIGH (ref 38–126)
Bilirubin, Direct: <0.1 mg/dL — ABNORMAL LOW (ref 0.1–0.5)
Total Bilirubin: 0.2 mg/dL — ABNORMAL LOW (ref 0.3–1.2)
Total Protein: 6.8 g/dL (ref 6.5–8.1)

## 2017-03-17 LAB — CBC
HCT: 27.7 % — ABNORMAL LOW (ref 36.0–46.0)
Hemoglobin: 8.7 g/dL — ABNORMAL LOW (ref 12.0–15.0)
MCH: 30.1 pg (ref 26.0–34.0)
MCHC: 31.4 g/dL (ref 30.0–36.0)
MCV: 95.8 fL (ref 78.0–100.0)
Platelets: 409 10*3/uL — ABNORMAL HIGH (ref 150–400)
RBC: 2.89 MIL/uL — ABNORMAL LOW (ref 3.87–5.11)
RDW: 14.3 % (ref 11.5–15.5)
WBC: 5.6 10*3/uL (ref 4.0–10.5)

## 2017-03-17 IMAGING — DX DG CHEST 1V PORT
1 series · 1 of 1 positions shown · non-contrast
Comparison: Prior radiograph from [DATE].

CLINICAL DATA: Initial evaluation for acute shortness of breath,
anasarca.

EXAM:
PORTABLE CHEST 1 VIEW

[chest ap]
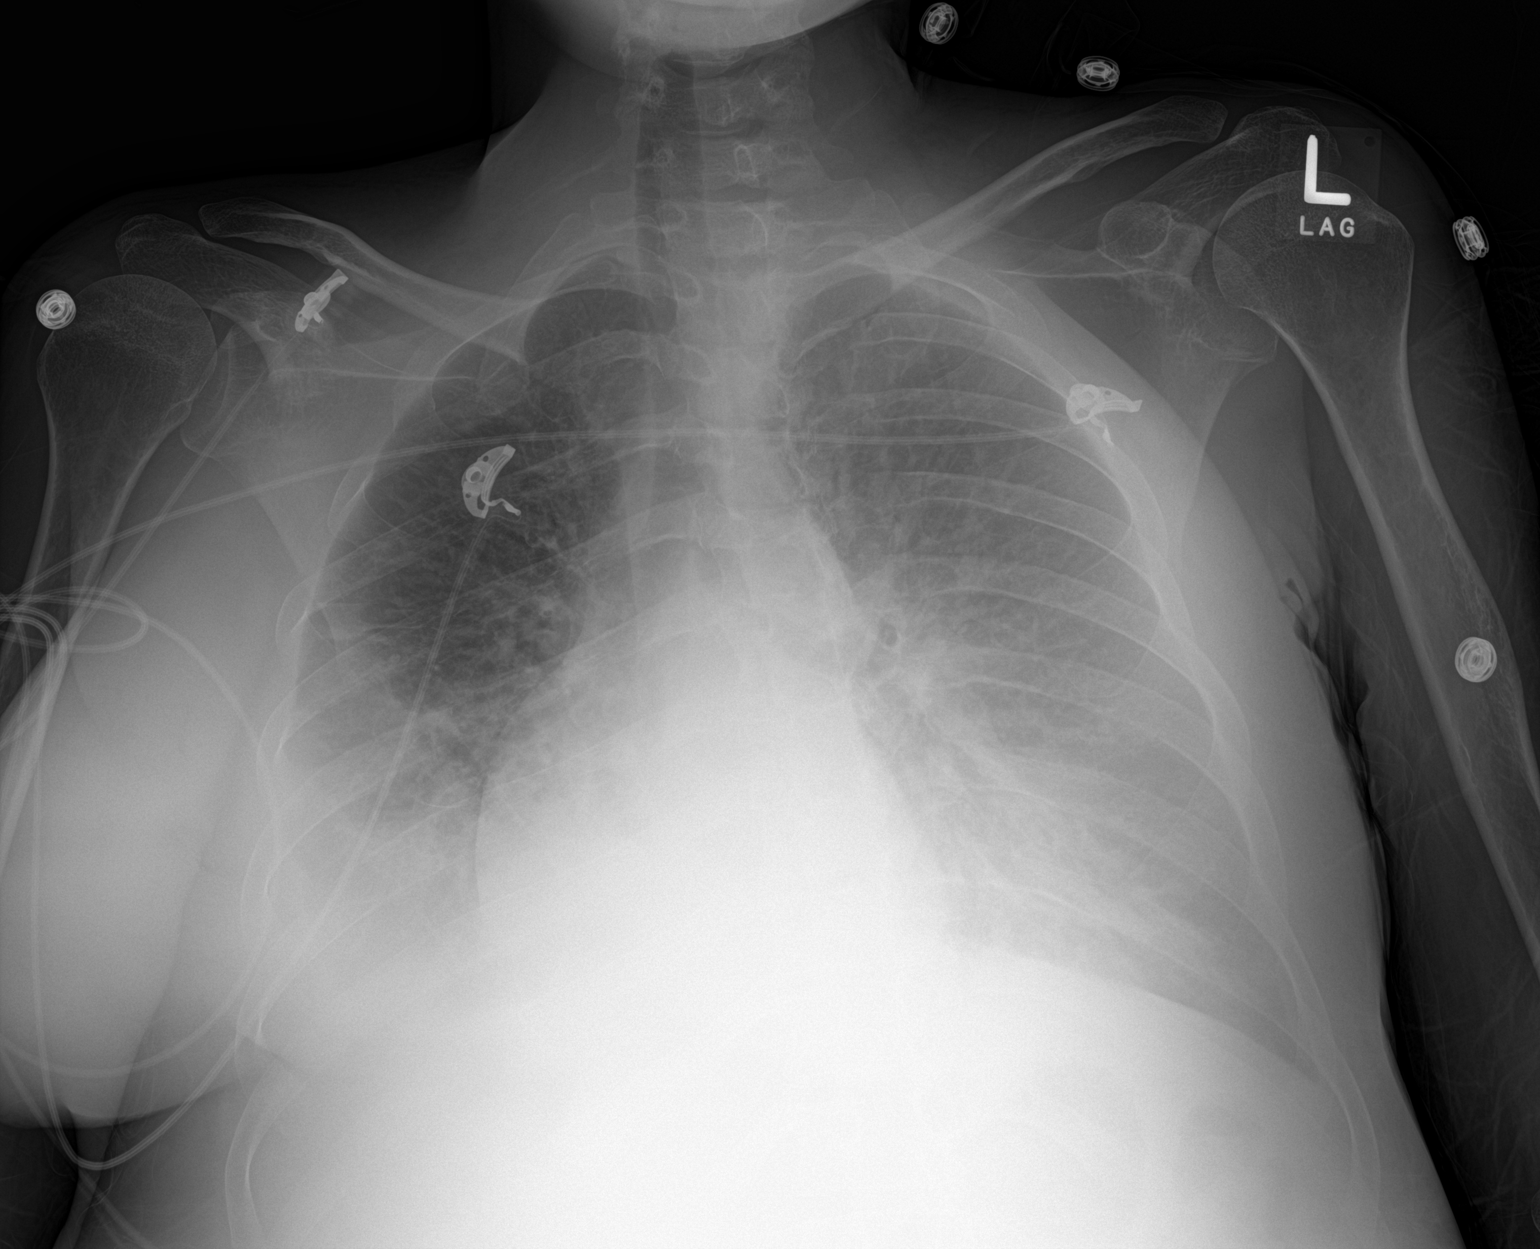

[1 of 1 positions shown; findings below may reference images not displayed]

FINDINGS: Patient is rotated to the right. Allowing for rotation, cardiac and
mediastinal silhouettes are stable in size and contour, and remain
within normal limits.

Lungs hypoinflated. Diffuse vascular and interstitial prominence
suggesting diffuse pulmonary interstitial edema. Layering right
pleural effusion. Associated right basilar opacity favored to
reflect edema and/or effusion with atelectasis. Infiltrate could be
considered in the correct clinical setting. No pneumothorax.

Osseous structures within normal limits.
IMPRESSION: 1. Findings suggestive of diffuse pulmonary interstitial edema.
2. Small to moderate layering right pleural effusion. Associated
right basilar opacity favored to reflect edema and/or atelectasis.
Infiltrate could be considered in the correct clinical setting.

## 2017-03-17 MED ORDER — ACETAMINOPHEN 650 MG RE SUPP: 650.0000 mg | Freq: Four times a day (QID) | RECTAL | Status: DC | PRN

## 2017-03-17 MED ORDER — CITALOPRAM HYDROBROMIDE 20 MG PO TABS
20.0000 mg | ORAL_TABLET | Freq: Every day | ORAL | Status: DC
  Administered 2017-03-17 – 2017-03-21 (×5): 20 mg via ORAL
  Filled 2017-03-17 (×5): qty 1

## 2017-03-17 MED ORDER — OXYCODONE-ACETAMINOPHEN 5-325 MG PO TABS
2.0000 | ORAL_TABLET | Freq: Four times a day (QID) | ORAL | Status: DC | PRN
  Administered 2017-03-17 – 2017-03-21 (×12): 2 via ORAL
  Filled 2017-03-17 (×13): qty 2

## 2017-03-17 MED ORDER — GABAPENTIN 300 MG PO CAPS
300.0000 mg | ORAL_CAPSULE | Freq: Two times a day (BID) | ORAL | Status: DC
  Administered 2017-03-17 – 2017-03-21 (×10): 300 mg via ORAL
  Filled 2017-03-17 (×10): qty 1

## 2017-03-17 MED ORDER — MORPHINE SULFATE (PF) 4 MG/ML IV SOLN
4.0000 mg | INTRAVENOUS | Status: DC | PRN
  Administered 2017-03-17 – 2017-03-18 (×6): 4 mg via INTRAVENOUS
  Filled 2017-03-17 (×7): qty 1

## 2017-03-17 MED ORDER — ONDANSETRON HCL 4 MG PO TABS: 4.0000 mg | ORAL_TABLET | Freq: Four times a day (QID) | ORAL | Status: DC | PRN

## 2017-03-17 MED ORDER — ENOXAPARIN SODIUM 40 MG/0.4ML ~~LOC~~ SOLN
40.0000 mg | Freq: Every day | SUBCUTANEOUS | Status: DC
  Administered 2017-03-17 – 2017-03-20 (×5): 40 mg via SUBCUTANEOUS
  Filled 2017-03-17 (×6): qty 0.4

## 2017-03-17 MED ORDER — TRAMADOL HCL 50 MG PO TABS: 50.0000 mg | ORAL_TABLET | Freq: Four times a day (QID) | ORAL | Status: DC | PRN

## 2017-03-17 MED ORDER — INSULIN GLARGINE 100 UNIT/ML ~~LOC~~ SOLN
10.0000 [IU] | Freq: Every day | SUBCUTANEOUS | Status: DC
  Filled 2017-03-17: qty 0.1

## 2017-03-17 MED ORDER — METHOCARBAMOL 500 MG PO TABS
500.0000 mg | ORAL_TABLET | Freq: Three times a day (TID) | ORAL | Status: DC | PRN
  Administered 2017-03-17 – 2017-03-21 (×3): 500 mg via ORAL
  Filled 2017-03-17 (×3): qty 1

## 2017-03-17 MED ORDER — CHOLESTYRAMINE LIGHT 4 G PO PACK
4.0000 g | PACK | Freq: Three times a day (TID) | ORAL | Status: DC
  Administered 2017-03-17 – 2017-03-21 (×14): 4 g via ORAL
  Filled 2017-03-17 (×15): qty 1

## 2017-03-17 MED ORDER — GLUCERNA SHAKE PO LIQD
237.0000 mL | Freq: Two times a day (BID) | ORAL | Status: DC
  Administered 2017-03-17 – 2017-03-18 (×3): 237 mL via ORAL
  Filled 2017-03-17 (×4): qty 237

## 2017-03-17 MED ORDER — LOPERAMIDE HCL 2 MG PO CAPS
2.0000 mg | ORAL_CAPSULE | Freq: Three times a day (TID) | ORAL | Status: DC
  Administered 2017-03-17 – 2017-03-21 (×15): 2 mg via ORAL
  Filled 2017-03-17 (×15): qty 1

## 2017-03-17 MED ORDER — PANCRELIPASE (LIP-PROT-AMYL) 12000-38000 UNITS PO CPEP
36000.0000 [IU] | ORAL_CAPSULE | Freq: Three times a day (TID) | ORAL | Status: DC
  Administered 2017-03-17 – 2017-03-21 (×15): 36000 [IU] via ORAL
  Filled 2017-03-17: qty 1
  Filled 2017-03-17 (×14): qty 3

## 2017-03-17 MED ORDER — DIPHENOXYLATE-ATROPINE 2.5-0.025 MG PO TABS: 2.0000 | ORAL_TABLET | Freq: Four times a day (QID) | ORAL | Status: DC

## 2017-03-17 MED ORDER — ACETAMINOPHEN 325 MG PO TABS
650.0000 mg | ORAL_TABLET | Freq: Four times a day (QID) | ORAL | Status: DC | PRN
  Administered 2017-03-17: 650 mg via ORAL
  Filled 2017-03-17: qty 2

## 2017-03-17 MED ORDER — OXYCODONE-ACETAMINOPHEN 5-325 MG PO TABS
2.0000 | ORAL_TABLET | Freq: Once | ORAL | Status: AC
  Administered 2017-03-17: 2 via ORAL
  Filled 2017-03-17: qty 2

## 2017-03-17 MED ORDER — ALBUTEROL SULFATE HFA 108 (90 BASE) MCG/ACT IN AERS: 1.0000 | INHALATION_SPRAY | RESPIRATORY_TRACT | Status: DC | PRN

## 2017-03-17 MED ORDER — ONDANSETRON HCL 4 MG/2ML IJ SOLN: 4.0000 mg | Freq: Four times a day (QID) | INTRAMUSCULAR | Status: DC | PRN

## 2017-03-17 MED ORDER — FUROSEMIDE 10 MG/ML IJ SOLN
40.0000 mg | Freq: Two times a day (BID) | INTRAMUSCULAR | Status: DC
  Administered 2017-03-17 – 2017-03-21 (×10): 40 mg via INTRAVENOUS
  Filled 2017-03-17 (×10): qty 4

## 2017-03-17 MED ORDER — FERROUS SULFATE 325 (65 FE) MG PO TABS
325.0000 mg | ORAL_TABLET | Freq: Two times a day (BID) | ORAL | Status: DC
  Administered 2017-03-17 – 2017-03-21 (×10): 325 mg via ORAL
  Filled 2017-03-17 (×11): qty 1

## 2017-03-17 MED ORDER — ALBUTEROL SULFATE (2.5 MG/3ML) 0.083% IN NEBU: 2.5000 mg | INHALATION_SOLUTION | RESPIRATORY_TRACT | Status: DC | PRN

## 2017-03-17 MED ORDER — FOLIC ACID 1 MG PO TABS
1.0000 mg | ORAL_TABLET | Freq: Every day | ORAL | Status: DC
  Administered 2017-03-17 – 2017-03-21 (×5): 1 mg via ORAL
  Filled 2017-03-17 (×5): qty 1

## 2017-03-17 MED ORDER — INSULIN ASPART 100 UNIT/ML ~~LOC~~ SOLN
0.0000 [IU] | Freq: Three times a day (TID) | SUBCUTANEOUS | Status: DC
Start: 2017-03-17 — End: 2017-03-21
  Administered 2017-03-17: 2 [IU] via SUBCUTANEOUS
  Administered 2017-03-17: 1 [IU] via SUBCUTANEOUS
  Administered 2017-03-18 (×2): 5 [IU] via SUBCUTANEOUS
  Administered 2017-03-19 (×2): 2 [IU] via SUBCUTANEOUS
  Administered 2017-03-20: 1 [IU] via SUBCUTANEOUS
  Administered 2017-03-20: 2 [IU] via SUBCUTANEOUS
  Administered 2017-03-21: 9 [IU] via SUBCUTANEOUS

## 2017-03-17 NOTE — Progress Notes (Signed)
Patient has had 2 dressing changes to the peri-rectal area. Patient is now c/o 8/10 pain.  Patient states that she cannot tolerate Tramadol. PCP to be notified

## 2017-03-17 NOTE — ED Notes (Signed)
ED TO INPATIENT HANDOFF REPORT  Name/Age/Gender [name] 26 y.o. female  Code Status    Code Status Orders  (From admission, onward)        Start     Ordered   03/17/17 0049  Full code  Continuous     03/17/17 0049    Code Status History    Date Active Date Inactive Code Status Order ID Comments User Context   02/26/2017 22:59 03/14/2017 22:21 Full Code [id number]  Karmen Bongo, MD Inpatient   02/26/2017 20:12 02/26/2017 22:59 Full Code [id number]  Aldean Jewett, MD ED   01/26/2015 05:13 01/29/2015 18:12 Full Code [id number]  Morene Crocker, [place] Inpatient   01/24/2015 10:21 01/26/2015 05:13 Full Code [id number]  Mcneil Sober, RN Inpatient   01/16/2015 19:30 01/24/2015 10:21 Full Code [id number]  Gwen Pounds, CNM Inpatient   06/11/2013 00:49 06/12/2013 13:40 Full Code [id number]  Theressa Millard, MD ED   04/28/2013 03:19 04/29/2013 18:14 Full Code [id number]  Rise Patience, MD Inpatient   09/01/2012 07:49 09/02/2012 11:59 Full Code [id number]  Derrill Kay, MD Inpatient   03/15/2012 23:46 03/16/2012 16:46 Full Code [id number]  Rueben Bash, RN Inpatient   02/28/2012 16:57 03/01/2012 19:21 Full Code [id number]  Tamsen Roers, RN Inpatient   11/23/2011 03:36 12/06/2011 20:04 Full Code [id number]  Aldean Ast, RN Inpatient   09/18/2011 10:07 09/20/2011 20:50 Full Code [id number]  Dionne Milo, NP Inpatient      Home/SNF/Other Home  Chief Complaint swelling all over; difficulty breathing   Level of Care/Admitting Diagnosis ED Disposition    ED Disposition Condition Comment   Admit  Hospital Area: [facility] [[id number]]  Level of Care: Telemetry [5]  Admit to tele based on following criteria: Acute CHF  Diagnosis: Anasarca [[id number]]  Admitting Physician: Rise Patience [phone]  Attending Physician: Rise Patience [phone]  Estimated length of stay: past midnight tomorrow  Certification:: I certify this patient will need  inpatient services for at least 2 midnights  PT Class (Do Not Modify): Inpatient [101]  PT Acc Code (Do Not Modify): Private [1]       Medical History Past Medical History:  Diagnosis Date  . Anxiety   . Crohn's disease ([place])   . Diabetes type 1, uncontrolled ([place]) 11/14/2011   Since age 78  . Fibromyalgia   . Gastroparesis   . Hypertension   . Infection    UTI April 2016    Allergies Allergies  Allergen Reactions  . Peanut-Containing Drug Products Anaphylaxis and Other (See Comments)    Pt states that she is allergic to [place] nuts.      . Lactose Intolerance (Gi) Other (See Comments)    Gi upset     IV Location/Drains/Wounds Patient Lines/Drains/Airways Status   Active Line/Drains/Airways    Name:   Placement date:   Placement time:   Site:   Days:   Peripheral IV 03/17/17 Right Wrist   03/17/17    0036    Wrist   less than 1   Incision (Closed) 03/01/17 Perineum Other (Comment)   03/01/17    1058     16          Labs/Imaging Results for orders placed or performed during the hospital encounter of 03/16/17 (from the past 48 hour(s))  CBG monitoring, ED     Status: None   Collection Time: 03/16/17  9:01 PM  Result Value Ref Range   Glucose-Capillary  65 65 - 99 mg/dL  CBG monitoring, ED     Status: Abnormal   Collection Time: 03/16/17  9:20 PM  Result Value Ref Range   Glucose-Capillary 64 (L) 65 - 99 mg/dL  CBG monitoring, ED     Status: None   Collection Time: 03/16/17 10:23 PM  Result Value Ref Range   Glucose-Capillary 83 65 - 99 mg/dL  CBC with Differential     Status: Abnormal   Collection Time: 03/16/17 10:25 PM  Result Value Ref Range   WBC 5.6 4.0 - 10.5 K/uL   RBC 2.90 (L) 3.87 - 5.11 MIL/uL   Hemoglobin 8.8 (L) 12.0 - 15.0 g/dL   HCT 27.6 (L) 36.0 - 46.0 %   MCV 95.2 78.0 - 100.0 fL   MCH 30.3 26.0 - 34.0 pg   MCHC 31.9 30.0 - 36.0 g/dL   RDW 14.2 11.5 - 15.5 %   Platelets 417 (H) 150 - 400 K/uL   Neutrophils Relative % 49 %   Neutro Abs  2.8 1.7 - 7.7 K/uL   Lymphocytes Relative 35 %   Lymphs Abs 2.0 0.7 - 4.0 K/uL   Monocytes Relative 12 %   Monocytes Absolute 0.6 0.1 - 1.0 K/uL   Eosinophils Relative 2 %   Eosinophils Absolute 0.1 0.0 - 0.7 K/uL   Basophils Relative 2 %   Basophils Absolute 0.1 0.0 - 0.1 K/uL  Comprehensive metabolic panel     Status: Abnormal   Collection Time: 03/16/17 10:25 PM  Result Value Ref Range   Sodium 143 135 - 145 mmol/L   Potassium 3.5 3.5 - 5.1 mmol/L    Comment: DELTA CHECK NOTED   Chloride 108 101 - 111 mmol/L   CO2 28 22 - 32 mmol/L   Glucose, Bld 79 65 - 99 mg/dL   BUN 8 6 - 20 mg/dL   Creatinine, Ser 0.33 (L) 0.44 - 1.00 mg/dL   Calcium 8.2 (L) 8.9 - 10.3 mg/dL   Total Protein 7.0 6.5 - 8.1 g/dL   Albumin 1.8 (L) 3.5 - 5.0 g/dL   AST 40 15 - 41 U/L   ALT 33 14 - 54 U/L   Alkaline Phosphatase 156 (H) 38 - 126 U/L   Total Bilirubin 0.2 (L) 0.3 - 1.2 mg/dL   GFR calc non Af Amer >60 >60 mL/min   GFR calc Af Amer >60 >60 mL/min    Comment: (NOTE) The eGFR has been calculated using the CKD EPI equation. This calculation has not been validated in all clinical situations. eGFR's persistently <60 mL/min signify possible Chronic Kidney Disease.    Anion gap 7 5 - 15  I-Stat CG4 Lactic Acid, ED     Status: None   Collection Time: 03/16/17 10:36 PM  Result Value Ref Range   Lactic Acid, Venous 1.69 0.5 - 1.9 mmol/L  Urinalysis, Routine w reflex microscopic     Status: Abnormal   Collection Time: 03/16/17 11:02 PM  Result Value Ref Range   Color, Urine YELLOW YELLOW   APPearance CLEAR CLEAR   Specific Gravity, Urine 1.018 1.005 - 1.030   pH 6.0 5.0 - 8.0   Glucose, UA NEGATIVE NEGATIVE mg/dL   Hgb urine dipstick SMALL (A) NEGATIVE   Bilirubin Urine NEGATIVE NEGATIVE   Ketones, ur NEGATIVE NEGATIVE mg/dL   Protein, ur NEGATIVE NEGATIVE mg/dL   Nitrite NEGATIVE NEGATIVE   Leukocytes, UA SMALL (A) NEGATIVE   RBC / HPF 0-5 0 - 5 RBC/hpf   WBC,  UA 6-30 0 - 5 WBC/hpf    Bacteria, UA RARE (A) NONE SEEN   Squamous Epithelial / LPF 0-5 (A) NONE SEEN   Mucus PRESENT    Budding Yeast PRESENT   CBG monitoring, ED     Status: None   Collection Time: 03/16/17 11:29 PM  Result Value Ref Range   Glucose-Capillary 74 65 - 99 mg/dL   Ct Abdomen Pelvis W Contrast  Result Date: 03/15/2017 CLINICAL DATA:  Vomiting, diarrhea, weakness. Recent hospitalization with DKA and buttock abscess treatment. EXAM: CT ABDOMEN AND PELVIS WITH CONTRAST TECHNIQUE: Multidetector CT imaging of the abdomen and pelvis was performed using the standard protocol following bolus administration of intravenous contrast. CONTRAST:  169m ISOVUE-300 IOPAMIDOL (ISOVUE-300) INJECTION 61% COMPARISON:  02/28/2017 FINDINGS: Lower chest: Bilateral pleural effusions, slightly enlarged from before. Bibasilar subsegmental atelectasis. Hepatobiliary: No focal liver abnormality is seen. Status post cholecystectomy. No biliary dilatation. Pancreas: Atrophic appearance. Spleen: Normal in size without focal abnormality. Adrenals/Urinary Tract: Adrenal glands are unremarkable. Kidneys are normal, without renal calculi, focal lesion, or hydronephrosis. Bladder is unremarkable. Stomach/Bowel: Stomach is within normal limits. No evidence of appendicitis. No evidence of bowel wall thickening, distention, or inflammatory changes. Vascular/Lymphatic: No significant vascular findings are present. No enlarged abdominal or pelvic lymph nodes. Reproductive: Uterus and bilateral adnexa are unremarkable. Other: Diffuse abdominal wall edema. Improved appearance of perirectal and right buttock abscess. Small amount of phlegmonous material and gas remain in the right buttock and intragluteal fold. No drainable collections seen. Musculoskeletal: No acute or significant osseous findings. IMPRESSION: Increased bilateral pleural effusions with associated subsegmental atelectasis. Persistent moderate abdominopelvic ascites. Diffuse abdominal wall  edema. Improved appearance of perirectal and right buttock abscess. Electronically Signed   By: DFidela SalisburyM.D.   On: 03/15/2017 16:38    Pending Labs Unresulted Labs (From admission, onward)   Start     Ordered   03/24/17 0500  Creatinine, serum  (enoxaparin (LOVENOX)    CrCl >/= 30 ml/min)  Weekly,   R    Comments:  while on enoxaparin therapy    03/17/17 0049   03/17/17 [id number] Basic metabolic panel  Tomorrow morning,   R     03/17/17 0049   03/17/17 0500  CBC  Tomorrow morning,   R     03/17/17 0049   03/17/17 0500  Hepatic function panel  Tomorrow morning,   R     03/17/17 0049   03/17/17 0048  CBC  (enoxaparin (LOVENOX)    CrCl >/= 30 ml/min)  Once,   R    Comments:  Baseline for enoxaparin therapy IF NOT ALREADY DRAWN.  Notify MD if PLT < 100 K.    03/17/17 0049   03/17/17 0048  Creatinine, serum  (enoxaparin (LOVENOX)    CrCl >/= 30 ml/min)  Once,   R    Comments:  Baseline for enoxaparin therapy IF NOT ALREADY DRAWN.    03/17/17 0049      Vitals/Pain Today's Vitals   03/16/17 2055 03/16/17 2220 03/16/17 2301 03/17/17 0036  BP:  (!) 150/105 (!) 125/93 111/70  Pulse:  98 97 95  Resp:  13 (!) 9 11  Temp:  98.4 F (36.9 C)    TempSrc:  Oral    SpO2:  98% 94% 94%  Weight: 180 lb (81.6 kg)     Height: '5\' 7"'$  (1.702 m)     PainSc:        Isolation Precautions No active isolations  Medications Medications  albuterol (PROVENTIL HFA;VENTOLIN HFA) 108 (90 Base) MCG/ACT inhaler 1-2 puff (not administered)  cholestyramine light (PREVALITE) packet 4 g (not administered)  citalopram (CELEXA) tablet 20 mg (not administered)  diphenoxylate-atropine (LOMOTIL) 2.5-0.025 MG per tablet 2 tablet (not administered)  feeding supplement (GLUCERNA SHAKE) (GLUCERNA SHAKE) liquid 237 mL (not administered)  ferrous sulfate tablet 325 mg (not administered)  folic acid (FOLVITE) tablet 1 mg (not administered)  gabapentin (NEURONTIN) capsule 300 mg (not administered)  insulin  glargine (LANTUS) injection 10 Units (not administered)  lipase/protease/amylase (CREON) capsule 36,000 Units (not administered)  loperamide (IMODIUM) capsule 2 mg (not administered)  methocarbamol (ROBAXIN) tablet 500 mg (not administered)  traMADol (ULTRAM) tablet 50 mg (not administered)  acetaminophen (TYLENOL) tablet 650 mg (not administered)    Or  acetaminophen (TYLENOL) suppository 650 mg (not administered)  ondansetron (ZOFRAN) tablet 4 mg (not administered)    Or  ondansetron (ZOFRAN) injection 4 mg (not administered)  insulin aspart (novoLOG) injection 0-9 Units (not administered)  enoxaparin (LOVENOX) injection 40 mg (not administered)  furosemide (LASIX) injection 40 mg (not administered)  furosemide (LASIX) injection 40 mg (40 mg Intravenous Given 03/17/17 0033)    Mobility walks

## 2017-03-17 NOTE — Consult Note (Signed)
[facility] Consult Note  [name] [birth year]  [id number].    Requesting MD: Bonner Puna Chief Complaint/Reason for Consult: wound  HPI:  Patient is a 26 y/o female with poorly controlled type 1 diabetes and recent diagnosis of Crohn's disease who was admitted to [facility] for anasarca related to malnutrition and hypoalbuminemia. Patient also recently underwent I&D of Right perirectal abscess 12/2 by Dr. Lucia Gaskins. We are asked to consult for wound management. Patient states her husband helps her manage the wound at home and they have been changing the dressing twice a day. Reports that wound is painful but that she has not noticed any increase in pain. No increased drainage. Was supposed to see Dr. Lucia Gaskins in the office tomorrow. She denies fever, chills. Reports some vomiting, diarrhea, recent fall this past Sunday. Seeing Dr. Collene Mares as primary GI and Dr. Benson [name] in the hospital at times too.   ROS: Review of Systems  Constitutional: Positive for malaise/fatigue. Negative for chills and fever.  Respiratory: Negative for cough and shortness of breath.   Cardiovascular: Positive for leg swelling. Negative for chest pain.  Gastrointestinal: Positive for diarrhea and vomiting. Negative for abdominal pain and nausea.  Musculoskeletal: Positive for falls.  All other systems reviewed and are negative.   Family History  Problem Relation Age of Onset  . Diabetes Mother   . Hypertension Father     Past Medical History:  Diagnosis Date  . Anxiety   . Crohn's disease ([place])   . Diabetes type 1, uncontrolled ([place]) 11/14/2011   Since age 59  . Fibromyalgia   . Gastroparesis   . Hypertension   . Infection    UTI April 2016    Past Surgical History:  Procedure Laterality Date  . ANKLE SURGERY    . CHOLECYSTECTOMY  11/15/2011   Procedure: LAPAROSCOPIC CHOLECYSTECTOMY WITH INTRAOPERATIVE CHOLANGIOGRAM;  Surgeon: [name], MD;  Location: [facility];  Service: General;  Laterality: N/A;  .  ESOPHAGOGASTRODUODENOSCOPY  12/03/2011   Procedure: ESOPHAGOGASTRODUODENOSCOPY (EGD);  Surgeon: Beryle Beams, MD;  Location: [facility];  Service: Endoscopy;  Laterality: N/A;  . FLEXIBLE SIGMOIDOSCOPY N/A 03/10/2017   Procedure: FLEXIBLE SIGMOIDOSCOPY;  Surgeon: Carol Ada, MD;  Location: [facility];  Service: Endoscopy;  Laterality: N/A;  . INCISION AND DRAINAGE PERIRECTAL ABSCESS N/A 03/01/2017   Procedure: IRRIGATION AND DEBRIDEMENT PERIRECTAL ABSCESS;  Surgeon: Alphonsa Overall, MD;  Location: [facility];  Service: General;  Laterality: N/A;  . LAPAROSCOPY  11/23/2011   Procedure: LAPAROSCOPY DIAGNOSTIC;  Surgeon: Edward Jolly, MD;  Location: [facility];  Service: General;  Laterality: N/A;    Social History:  reports that  has never smoked. she has never used smokeless tobacco. She reports that she drinks alcohol. She reports that she does not use drugs.  Allergies:  Allergies  Allergen Reactions  . Peanut-Containing Drug Products Anaphylaxis and Other (See Comments)    Pt states that she is allergic to [place] nuts.      . Lactose Intolerance (Gi) Other (See Comments)    Gi upset     Medications Prior to Admission  Medication Sig Dispense Refill  . albuterol (PROVENTIL HFA;VENTOLIN HFA) 108 (90 Base) MCG/ACT inhaler Inhale 1-2 puffs into the lungs every 4 (four) hours as needed for wheezing or shortness of breath. (Patient not taking: Reported on 03/15/2017) 1 Inhaler 0  . cholestyramine light (PREVALITE) 4 g packet Take 1 packet (4 g total) by mouth 3 (three) times daily. 180 packet 0  . citalopram (  CELEXA) 20 MG tablet Take 1 tablet (20 mg total) by mouth daily. 30 tablet 0  . diphenoxylate-atropine (LOMOTIL) 2.5-0.025 MG tablet Take 2 tablets by mouth 4 (four) times daily. 30 tablet 0  . EPINEPHrine (EPIPEN 2-PAK) 0.3 mg/0.3 mL IJ SOAJ injection Inject 0.3 mLs (0.3 mg total) into the muscle once. (Patient taking differently: Inject 0.3 mg into the muscle as needed (allergic  reaction). ) 1 Device 1  . feeding supplement, GLUCERNA SHAKE, (GLUCERNA SHAKE) LIQD Take 237 mLs by mouth 2 (two) times daily between meals. 30 Can 0  . ferrous sulfate 325 (65 FE) MG tablet Take 1 tablet (325 mg total) by mouth 2 (two) times daily with a meal. 60 tablet 0  . folic acid (FOLVITE) 1 MG tablet Take 1 tablet (1 mg total) by mouth daily. 30 tablet 0  . gabapentin (NEURONTIN) 300 MG capsule Take 1 capsule (300 mg total) by mouth 2 (two) times daily. 30 capsule 0  . glucose 4 GM chewable tablet Chew 1 tablet (4 g total) by mouth as needed for low blood sugar. 50 tablet 12  . Ibuprofen (MIDOL) 200 MG CAPS Take 400 mg by mouth daily as needed (pain).    . insulin glargine (LANTUS) 100 UNIT/ML injection Inject 0.1 mLs (10 Units total) into the skin daily. Sometimes has to adjust based on BG levels 10 mL 0  . insulin lispro (HUMALOG) 100 UNIT/ML injection BG 70 - 120: 0 units BG 121 - 150: 1 unit BG 151 - 200: 2 units BG 201 - 250: 3 units BG 251 - 300: 5 units BG 301 - 350: 7 units BG 351 - 400: 9 units 10 mL 11  . lipase/protease/amylase (CREON) 36000 UNITS CPEP capsule Take 1 capsule (36,000 Units total) by mouth 3 (three) times daily with meals. 270 capsule 0  . loperamide (IMODIUM) 2 MG capsule Take 1 capsule (2 mg total) by mouth 3 (three) times daily before meals. DO NOT TAKE IT FOR THE DAY YOU DONT HAVE A BOWEL MOVEMENT. 30 capsule 0  . methocarbamol (ROBAXIN) 500 MG tablet Take 1 tablet (500 mg total) by mouth every 8 (eight) hours as needed for muscle spasms. 30 tablet 0  . traMADol (ULTRAM) 50 MG tablet Take 1 tablet (50 mg total) by mouth every 6 (six) hours as needed for moderate pain. 20 tablet 0    Blood pressure 119/68, pulse 97, temperature 98.1 F (36.7 C), temperature source Oral, resp. rate 16, height '5\' 7"'$  (1.702 m), weight 79.2 kg (174 lb 8 oz), last menstrual period 02/23/2017, SpO2 94 %, currently breastfeeding. Physical Exam: Physical Exam  Constitutional: She is  oriented to person, place, and time. She is cooperative. She has a sickly appearance. No distress.  HENT:  Head: Normocephalic and atraumatic.  Right Ear: External ear normal.  Left Ear: External ear normal.  Nose: Nose normal.  Mouth/Throat: Mucous membranes are normal.  Eyes: Conjunctivae and lids are normal. Pupils are equal, round, and reactive to light. No scleral icterus.  Neck: Normal range of motion and phonation normal. Neck supple. No thyromegaly present.  Cardiovascular: Normal rate and regular rhythm.  Pulses:      Radial pulses are 2+ on the right side, and 2+ on the left side.       Dorsalis pedis pulses are 2+ on the right side, and 2+ on the left side.  Pulmonary/Chest: Effort normal. She has rales (bibasilar).  Abdominal: Soft. Bowel sounds are normal. There is no tenderness.  There is no rigidity, no rebound and no guarding.  Genitourinary:  Genitourinary Comments: Right perirectal wound pink with some purulence, appears more like colonization rather than gross infection (see picture below)  Musculoskeletal:  No loss in ROM in BL upper and lower extremities. No gross deformities in BL upper and lower extremities.   Neurological: She is alert and oriented to person, place, and time.  Skin: Skin is warm and dry. No rash noted.  Psychiatric: She has a normal mood and affect. Her speech is normal and behavior is normal.       Results for orders placed or performed during the hospital encounter of 03/16/17 (from the past 48 hour(s))  CBG monitoring, ED     Status: None   Collection Time: 03/16/17  9:01 PM  Result Value Ref Range   Glucose-Capillary 65 65 - 99 mg/dL  CBG monitoring, ED     Status: Abnormal   Collection Time: 03/16/17  9:20 PM  Result Value Ref Range   Glucose-Capillary 64 (L) 65 - 99 mg/dL  CBG monitoring, ED     Status: None   Collection Time: 03/16/17 10:23 PM  Result Value Ref Range   Glucose-Capillary 83 65 - 99 mg/dL  CBC with Differential      Status: Abnormal   Collection Time: 03/16/17 10:25 PM  Result Value Ref Range   WBC 5.6 4.0 - 10.5 K/uL   RBC 2.90 (L) 3.87 - 5.11 MIL/uL   Hemoglobin 8.8 (L) 12.0 - 15.0 g/dL   HCT 27.6 (L) 36.0 - 46.0 %   MCV 95.2 78.0 - 100.0 fL   MCH 30.3 26.0 - 34.0 pg   MCHC 31.9 30.0 - 36.0 g/dL   RDW 14.2 11.5 - 15.5 %   Platelets 417 (H) 150 - 400 K/uL   Neutrophils Relative % 49 %   Neutro Abs 2.8 1.7 - 7.7 K/uL   Lymphocytes Relative 35 %   Lymphs Abs 2.0 0.7 - 4.0 K/uL   Monocytes Relative 12 %   Monocytes Absolute 0.6 0.1 - 1.0 K/uL   Eosinophils Relative 2 %   Eosinophils Absolute 0.1 0.0 - 0.7 K/uL   Basophils Relative 2 %   Basophils Absolute 0.1 0.0 - 0.1 K/uL  Comprehensive metabolic panel     Status: Abnormal   Collection Time: 03/16/17 10:25 PM  Result Value Ref Range   Sodium 143 135 - 145 mmol/L   Potassium 3.5 3.5 - 5.1 mmol/L    Comment: DELTA CHECK NOTED   Chloride 108 101 - 111 mmol/L   CO2 28 22 - 32 mmol/L   Glucose, Bld 79 65 - 99 mg/dL   BUN 8 6 - 20 mg/dL   Creatinine, Ser 0.33 (L) 0.44 - 1.00 mg/dL   Calcium 8.2 (L) 8.9 - 10.3 mg/dL   Total Protein 7.0 6.5 - 8.1 g/dL   Albumin 1.8 (L) 3.5 - 5.0 g/dL   AST 40 15 - 41 U/L   ALT 33 14 - 54 U/L   Alkaline Phosphatase 156 (H) 38 - 126 U/L   Total Bilirubin 0.2 (L) 0.3 - 1.2 mg/dL   GFR calc non Af Amer >60 >60 mL/min   GFR calc Af Amer >60 >60 mL/min    Comment: (NOTE) The eGFR has been calculated using the CKD EPI equation. This calculation has not been validated in all clinical situations. eGFR's persistently <60 mL/min signify possible Chronic Kidney Disease.    Anion gap 7 5 - 15  I-Stat  CG4 Lactic Acid, ED     Status: None   Collection Time: 03/16/17 10:36 PM  Result Value Ref Range   Lactic Acid, Venous 1.69 0.5 - 1.9 mmol/L  Urinalysis, Routine w reflex microscopic     Status: Abnormal   Collection Time: 03/16/17 11:02 PM  Result Value Ref Range   Color, Urine YELLOW YELLOW   APPearance CLEAR  CLEAR   Specific Gravity, Urine 1.018 1.005 - 1.030   pH 6.0 5.0 - 8.0   Glucose, UA NEGATIVE NEGATIVE mg/dL   Hgb urine dipstick SMALL (A) NEGATIVE   Bilirubin Urine NEGATIVE NEGATIVE   Ketones, ur NEGATIVE NEGATIVE mg/dL   Protein, ur NEGATIVE NEGATIVE mg/dL   Nitrite NEGATIVE NEGATIVE   Leukocytes, UA SMALL (A) NEGATIVE   RBC / HPF 0-5 0 - 5 RBC/hpf   WBC, UA 6-30 0 - 5 WBC/hpf   Bacteria, UA RARE (A) NONE SEEN   Squamous Epithelial / LPF 0-5 (A) NONE SEEN   Mucus PRESENT    Budding Yeast PRESENT   CBG monitoring, ED     Status: None   Collection Time: 03/16/17 11:29 PM  Result Value Ref Range   Glucose-Capillary 74 65 - 99 mg/dL  Glucose, capillary     Status: Abnormal   Collection Time: 03/17/17  1:26 AM  Result Value Ref Range   Glucose-Capillary 64 (L) 65 - 99 mg/dL  Glucose, capillary     Status: Abnormal   Collection Time: 03/17/17  3:02 AM  Result Value Ref Range   Glucose-Capillary 133 (H) 65 - 99 mg/dL  Basic metabolic panel     Status: Abnormal   Collection Time: 03/17/17  4:11 AM  Result Value Ref Range   Sodium 144 135 - 145 mmol/L   Potassium 3.4 (L) 3.5 - 5.1 mmol/L   Chloride 107 101 - 111 mmol/L   CO2 30 22 - 32 mmol/L   Glucose, Bld 113 (H) 65 - 99 mg/dL   BUN 8 6 - 20 mg/dL   Creatinine, Ser 0.34 (L) 0.44 - 1.00 mg/dL   Calcium 8.2 (L) 8.9 - 10.3 mg/dL   GFR calc non Af Amer >60 >60 mL/min   GFR calc Af Amer >60 >60 mL/min    Comment: (NOTE) The eGFR has been calculated using the CKD EPI equation. This calculation has not been validated in all clinical situations. eGFR's persistently <60 mL/min signify possible Chronic Kidney Disease.    Anion gap 7 5 - 15  CBC     Status: Abnormal   Collection Time: 03/17/17  4:11 AM  Result Value Ref Range   WBC 5.6 4.0 - 10.5 K/uL   RBC 2.89 (L) 3.87 - 5.11 MIL/uL   Hemoglobin 8.7 (L) 12.0 - 15.0 g/dL   HCT 27.7 (L) 36.0 - 46.0 %   MCV 95.8 78.0 - 100.0 fL   MCH 30.1 26.0 - 34.0 pg   MCHC 31.4 30.0 -  36.0 g/dL   RDW 14.3 11.5 - 15.5 %   Platelets 409 (H) 150 - 400 K/uL  Hepatic function panel     Status: Abnormal   Collection Time: 03/17/17  4:11 AM  Result Value Ref Range   Total Protein 6.8 6.5 - 8.1 g/dL   Albumin 1.8 (L) 3.5 - 5.0 g/dL   AST 44 (H) 15 - 41 U/L   ALT 31 14 - 54 U/L   Alkaline Phosphatase 146 (H) 38 - 126 U/L   Total Bilirubin 0.2 (L) 0.3 -  1.2 mg/dL   Bilirubin, Direct <0.1 (L) 0.1 - 0.5 mg/dL   Indirect Bilirubin NOT CALCULATED 0.3 - 0.9 mg/dL  Glucose, capillary     Status: Abnormal   Collection Time: 03/17/17  6:45 AM  Result Value Ref Range   Glucose-Capillary 63 (L) 65 - 99 mg/dL  Glucose, capillary     Status: None   Collection Time: 03/17/17  7:30 AM  Result Value Ref Range   Glucose-Capillary 82 65 - 99 mg/dL  Glucose, capillary     Status: Abnormal   Collection Time: 03/17/17 11:59 AM  Result Value Ref Range   Glucose-Capillary 130 (H) 65 - 99 mg/dL   Ct Abdomen Pelvis W Contrast  Result Date: 03/15/2017 CLINICAL DATA:  Vomiting, diarrhea, weakness. Recent hospitalization with DKA and buttock abscess treatment. EXAM: CT ABDOMEN AND PELVIS WITH CONTRAST TECHNIQUE: Multidetector CT imaging of the abdomen and pelvis was performed using the standard protocol following bolus administration of intravenous contrast. CONTRAST:  128m ISOVUE-300 IOPAMIDOL (ISOVUE-300) INJECTION 61% COMPARISON:  02/28/2017 FINDINGS: Lower chest: Bilateral pleural effusions, slightly enlarged from before. Bibasilar subsegmental atelectasis. Hepatobiliary: No focal liver abnormality is seen. Status post cholecystectomy. No biliary dilatation. Pancreas: Atrophic appearance. Spleen: Normal in size without focal abnormality. Adrenals/Urinary Tract: Adrenal glands are unremarkable. Kidneys are normal, without renal calculi, focal lesion, or hydronephrosis. Bladder is unremarkable. Stomach/Bowel: Stomach is within normal limits. No evidence of appendicitis. No evidence of bowel wall  thickening, distention, or inflammatory changes. Vascular/Lymphatic: No significant vascular findings are present. No enlarged abdominal or pelvic lymph nodes. Reproductive: Uterus and bilateral adnexa are unremarkable. Other: Diffuse abdominal wall edema. Improved appearance of perirectal and right buttock abscess. Small amount of phlegmonous material and gas remain in the right buttock and intragluteal fold. No drainable collections seen. Musculoskeletal: No acute or significant osseous findings. IMPRESSION: Increased bilateral pleural effusions with associated subsegmental atelectasis. Persistent moderate abdominopelvic ascites. Diffuse abdominal wall edema. Improved appearance of perirectal and right buttock abscess. Electronically Signed   By: DFidela SalisburyM.D.   On: 03/15/2017 16:38   Dg Chest Port 1 View  Result Date: 03/17/2017 CLINICAL DATA:  Initial evaluation for acute shortness of breath, anasarca. EXAM: PORTABLE CHEST 1 VIEW COMPARISON:  Prior radiograph from 02/26/2017. FINDINGS: Patient is rotated to the right. Allowing for rotation, cardiac and mediastinal silhouettes are stable in size and contour, and remain within normal limits. Lungs hypoinflated. Diffuse vascular and interstitial prominence suggesting diffuse pulmonary interstitial edema. Layering right pleural effusion. Associated right basilar opacity favored to reflect edema and/or effusion with atelectasis. Infiltrate could be considered in the correct clinical setting. No pneumothorax. Osseous structures within normal limits. IMPRESSION: 1. Findings suggestive of diffuse pulmonary interstitial edema. 2. Small to moderate layering right pleural effusion. Associated right basilar opacity favored to reflect edema and/or atelectasis. Infiltrate could be considered in the correct clinical setting. Electronically Signed   By: BJeannine BogaM.D.   On: 03/17/2017 02:28      Assessment/Plan Anasarca secondary to malnutrition  and hypoalbuminemia T1 Diabetes Mellitus, uncontrolled  Crohn's disease R Perirectal abscess - s/p I&D 03/01/17 Dr. NLucia Gaskins- WBC 5.6, afebrile - wound looks colonized - increase dressing changes to QID - sitz baths prn - dressing will also need to be changed any time it is soiled - we will follow intermittently and I have rescheduled patient's follow up appointment with Dr. NLucia Gaskinsfor 04/08/17  FEN: HH/CM VTE: SCDs, lovenox ID: No current abx  KBrigid Re [facility] 03/17/2017,  1:04 PM Pager: [phone] Consults: [phone] Mon-Fri 7:00 am-4:30 pm Sat-Sun 7:00 am-11:30 am

## 2017-03-17 NOTE — Progress Notes (Signed)
  Echocardiogram 2D Echocardiogram has been performed.  [name] F 03/17/2017, 3:36 PM

## 2017-03-17 NOTE — Progress Notes (Signed)
PROGRESS NOTE  [name]  WUJ:[id number] DOB: 1990-07-12 DOA: 03/16/2017 PCP: Andi DevonShelton, Kimberly, MD  Outpatient Specialists: GI, Dr. Loreta AveMann Brief Narrative: Kathryn Cosslicia N Stephen is a 26 y.o. female with a history of recently diagnosed Crohn's disease, T1DM who presented to the ED with swelling of her entire body, having gained about 65 pounds in the previous 3 weeks. She had recently been discharged after an admission for DKA and perirectal abscess that required operative debridement. Her per oral intake has been diminished and she has had 3+ loose stools per day which is about baseline. On exam she exhibited anasarca, CXR demonstrating interstitial edema and pleural effusion. Albumin 1.8, hgb 8.8, WBC normal at 5.6. Urinalysis with no proteinuria. Lasix was administered and she was admitted earlier this morning. Echocardiogram is pending.  Assessment & Plan: Principal Problem:   Anasarca Active Problems:   Diabetes type 1, uncontrolled (HCC)   Protein-calorie malnutrition, severe (HCC)  Anasarca with ascites: Suspect due to severe protein calorie malnutrition/hypoalbuminemia due to malabsorption from Crohn's disease. During recent admission (discharged 12/15) patient had Dopplers which were negative for DVT. - Lasix IV as ordered, monitor I/O, weights, BMP - Echo pending. No proteinuria.  - Multivitamin, nutrition consulted.  - Will request paracentesis for symptomatic improvement in pain, dyspnea, and hopefull improving po intake.  T1DM: Recent HbA1c 12.2% - Hypoglycemic this AM, so lantus held, continue SSI as pt needs insulin. If continues, will start D5 and lantus.  - Check cortisol  Perirectal abscess: s/p operative debridement 12/2, completed culture-driven antibiotics 12/14. Afebrile, no leukocytosis or recent worsening of wound. Several severe risk factors for poor wound healing including poor DM control, poor nutritional status.  - I've consulted surgery for wound care  recommendations, had follow up scheduled tomorrow.  - Pain control as ordered  Crohn's disease: Recently Dx. s/p flex sigmoidoscopy 12/11 with biopsies showing benign mucosa without inflammation or dysplasia. - If diarrhea worsens, will seek GI input. - Continue cholestyramine, lomotil, imodium, creon scheduled for diarrhea. Recently checked for CDiff with +Ag, negative toxin.   Anemia: Chronic, required transfusion during previous hospitalization. Anemia panel showed sufficient B12, low folate, and no iron deficiency (on iron supplement).  - Monitor CBC - Continue iron, folate supplements and multivitamin  DVT prophylaxis: Lovenox Code Status: Full Family Communication: None at bedside Disposition Plan: Uncertain  Consultants:   General surgery  Procedures:   Echocardiogram 03/17/2017: Pending  Antimicrobials:  None   Subjective: Confirms above history, says she feels deconditioned, actually thinks she's eating enough, denies emesis, just having nonbloody diarrhea. Also severe pain everywhere due to tense skin/swelling, diffusely across the abdomen, and around the right buttock associated with abscess.   Objective: Vitals:   03/17/17 0036 03/17/17 0124 03/17/17 0548 03/17/17 0620  BP: 111/70 (!) 150/99 119/68   Pulse: 95 97 97   Resp: 11 14 16    Temp:  98.5 F (36.9 C) 98.1 F (36.7 C)   TempSrc:  Oral Oral   SpO2: 94% 94% 94%   Weight:  82.1 kg (181 lb 1.6 oz)  79.2 kg (174 lb 8 oz)  Height:  5\' 7"  (1.702 m)      Intake/Output Summary (Last 24 hours) at 03/17/2017 1610 Last data filed at 03/17/2017 0616 Gross per 24 hour  Intake 270 ml  Output 2400 ml  Net -2130 ml   Filed Weights   03/16/17 2055 03/17/17 0124 03/17/17 0620  Weight: 81.6 kg (180 lb) 82.1 kg (181 lb 1.6 oz) 79.2 kg (  174 lb 8 oz)    Gen: Chronically ill-appearing female in no distress  Pulm: Non-labored breathing room air. Decreased at bases.   CV: Regular rate and rhythm. No murmur, rub,  or gallop. No JVD, 2+ pitting pedal edema. GI: Abdomen soft, diffusely tender without rebound, non-distended, with normoactive bowel sounds. Abd wall edema. Ext: Warm, no deformities Skin: Right buttock abscess not examined per pt preference this AM Neuro: Alert and oriented. No focal neurological deficits. Psych: Judgement and insight appear normal. Mood & affect appropriate.   Data Reviewed: I have personally reviewed following labs and imaging studies  CBC: Recent Labs  Lab 03/15/17 1419 03/16/17 2225 03/17/17 0411  WBC 5.0 5.6 5.6  NEUTROABS 3.7 2.8  --   HGB 9.4* 8.8* 8.7*  HCT 29.3* 27.6* 27.7*  MCV 95.1 95.2 95.8  PLT 409* 417* 409*   Basic Metabolic Panel: Recent Labs  Lab 03/15/17 1419 03/16/17 2225 03/17/17 0411  NA 140 143 144  K 4.3 3.5 3.4*  CL 104 108 107  CO2 28 28 30   GLUCOSE 220* 79 113*  BUN 13 8 8   CREATININE 0.42* 0.33* 0.34*  CALCIUM 8.2* 8.2* 8.2*   GFR: Estimated Creatinine Clearance: 114.4 mL/min (A) (by C-G formula based on SCr of 0.34 mg/dL (L)). Liver Function Tests: Recent Labs  Lab 03/15/17 1419 03/16/17 2225 03/17/17 0411  AST 274* 40 44*  ALT 62* 33 31  ALKPHOS 230* 156* 146*  BILITOT 0.7 0.2* 0.2*  PROT 7.3 7.0 6.8  ALBUMIN 1.8* 1.8* 1.8*   No results for input(s): LIPASE, AMYLASE in the last 168 hours. No results for input(s): AMMONIA in the last 168 hours. Coagulation Profile: No results for input(s): INR, PROTIME in the last 168 hours. Cardiac Enzymes: No results for input(s): CKTOTAL, CKMB, CKMBINDEX, TROPONINI in the last 168 hours. BNP (last 3 results) No results for input(s): PROBNP in the last 8760 hours. HbA1C: No results for input(s): HGBA1C in the last 72 hours. CBG: Recent Labs  Lab 03/17/17 0126 03/17/17 0302 03/17/17 0645 03/17/17 0730 03/17/17 1159  GLUCAP 64* 133* 63* 82 130*   Lipid Profile: No results for input(s): CHOL, HDL, LDLCALC, TRIG, CHOLHDL, LDLDIRECT in the last 72 hours. Thyroid  Function Tests: No results for input(s): TSH, T4TOTAL, FREET4, T3FREE, THYROIDAB in the last 72 hours. Anemia Panel: No results for input(s): VITAMINB12, FOLATE, FERRITIN, TIBC, IRON, RETICCTPCT in the last 72 hours. Urine analysis:    Component Value Date/Time   COLORURINE YELLOW 03/16/2017 2302   APPEARANCEUR CLEAR 03/16/2017 2302   LABSPEC 1.018 03/16/2017 2302   PHURINE 6.0 03/16/2017 2302   GLUCOSEU NEGATIVE 03/16/2017 2302   HGBUR SMALL (A) 03/16/2017 2302   BILIRUBINUR NEGATIVE 03/16/2017 2302   BILIRUBINUR negative 01/02/2015 1717   KETONESUR NEGATIVE 03/16/2017 2302   PROTEINUR NEGATIVE 03/16/2017 2302   UROBILINOGEN 0.2 01/13/2015 0223   NITRITE NEGATIVE 03/16/2017 2302   LEUKOCYTESUR SMALL (A) 03/16/2017 2302   No results found for this or any previous visit (from the past 240 hour(s)).    Radiology Studies: Ct Abdomen Pelvis W Contrast  Result Date: 03/15/2017 CLINICAL DATA:  Vomiting, diarrhea, weakness. Recent hospitalization with DKA and buttock abscess treatment. EXAM: CT ABDOMEN AND PELVIS WITH CONTRAST TECHNIQUE: Multidetector CT imaging of the abdomen and pelvis was performed using the standard protocol following bolus administration of intravenous contrast. CONTRAST:  ISOVUE-300 IOPAMIDOL (ISOVUE-300) INJECTION 61% COMPARISON:  02/28/2017 FINDINGS: Lower chest: Bilateral pleural effusions, slightly enlarged from before. Bibasilar subsegmental atelectasis.  Hepatobiliary: No focal liver abnormality is seen. Status post cholecystectomy. No biliary dilatation. Pancreas: Atrophic appearance. Spleen: Normal in size without focal abnormality. Adrenals/Urinary Tract: Adrenal glands are unremarkable. Kidneys are normal, without renal calculi, focal lesion, or hydronephrosis. Bladder is unremarkable. Stomach/Bowel: Stomach is within normal limits. No evidence of appendicitis. No evidence of bowel wall thickening, distention, or inflammatory changes. Vascular/Lymphatic: No  significant vascular findings are present. No enlarged abdominal or pelvic lymph nodes. Reproductive: Uterus and bilateral adnexa are unremarkable. Other: Diffuse abdominal wall edema. Improved appearance of perirectal and right buttock abscess. Small amount of phlegmonous material and gas remain in the right buttock and intragluteal fold. No drainable collections seen. Musculoskeletal: No acute or significant osseous findings. IMPRESSION: Increased bilateral pleural effusions with associated subsegmental atelectasis. Persistent moderate abdominopelvic ascites. Diffuse abdominal wall edema. Improved appearance of perirectal and right buttock abscess. Electronically Signed   By: Ted Mcalpine M.D.   On: 03/15/2017 16:38   Dg Chest Port 1 View  Result Date: 03/17/2017 CLINICAL DATA:  Initial evaluation for acute shortness of breath, anasarca. EXAM: PORTABLE CHEST 1 VIEW COMPARISON:  Prior radiograph from 02/26/2017. FINDINGS: Patient is rotated to the right. Allowing for rotation, cardiac and mediastinal silhouettes are stable in size and contour, and remain within normal limits. Lungs hypoinflated. Diffuse vascular and interstitial prominence suggesting diffuse pulmonary interstitial edema. Layering right pleural effusion. Associated right basilar opacity favored to reflect edema and/or effusion with atelectasis. Infiltrate could be considered in the correct clinical setting. No pneumothorax. Osseous structures within normal limits. IMPRESSION: 1. Findings suggestive of diffuse pulmonary interstitial edema. 2. Small to moderate layering right pleural effusion. Associated right basilar opacity favored to reflect edema and/or atelectasis. Infiltrate could be considered in the correct clinical setting. Electronically Signed   By: Rise Mu M.D.   On: 03/17/2017 02:28    Scheduled Meds: . cholestyramine light  4 g Oral TID  . citalopram  20 mg Oral Daily  . enoxaparin (LOVENOX) injection  40  mg Subcutaneous QHS  . feeding supplement (GLUCERNA SHAKE)  237 mL Oral BID BM  . ferrous sulfate  325 mg Oral BID WC  . folic acid  1 mg Oral Daily  . furosemide  40 mg Intravenous Q12H  . gabapentin  300 mg Oral BID  . insulin aspart  0-9 Units Subcutaneous TID WC  . lipase/protease/amylase  36,000 Units Oral TID WC  . loperamide  2 mg Oral TID AC   Continuous Infusions:   LOS: 1 day   Time spent: 25 minutes.  Hazeline Junker, MD [facility] Pager [phone]  If 7PM-7AM, please contact night-coverage [email or web address] Password [email or web address] 03/17/2017, 4:10 PM

## 2017-03-17 NOTE — ED Notes (Signed)
Bed assigned @ 0043 rm 1425

## 2017-03-17 NOTE — H&P (Addendum)
History and Physical    [name] IRS:[id number] DOB: 1990-08-23 DOA: 03/16/2017  PCP: [name], MD  Patient coming from: Home.  Chief Complaint: Peripheral edema and shortness of breath.  HPI: [name] is a 26 y.o. female with history of diabetes mellitus type 1 who was recently admitted for perirectal abscess and DKA presented to the ER because of increasing peripheral edema with shortness of breath.  Denies any chest pain productive cough fever or chills.  Patient has gained at least 65 pounds since end of November 3 weeks ago.  ED Course: In the ER on exam patient has diffuse edema both lower and upper extremity with chest x-ray showing interstitial edema and pleural effusion.  Patient was given Lasix 40 mg IV and admitted for further management of fluid overload likely secondary to third spacing from hypoalbuminemia secondary to malnutrition.  Review of Systems: As per HPI, rest all negative.   Past Medical History:  Diagnosis Date  . Anxiety   . Crohn's disease ([place])   . Diabetes type 1, uncontrolled ([place]) 11/14/2011   Since age 54  . Fibromyalgia   . Gastroparesis   . Hypertension   . Infection    UTI April 2016    Past Surgical History:  Procedure Laterality Date  . ANKLE SURGERY    . CHOLECYSTECTOMY  11/15/2011   Procedure: LAPAROSCOPIC CHOLECYSTECTOMY WITH INTRAOPERATIVE CHOLANGIOGRAM;  Surgeon: [name], MD;  Location: [facility];  Service: General;  Laterality: N/A;  . ESOPHAGOGASTRODUODENOSCOPY  12/03/2011   Procedure: ESOPHAGOGASTRODUODENOSCOPY (EGD);  Surgeon: Beryle Beams, MD;  Location: [facility];  Service: Endoscopy;  Laterality: N/A;  . FLEXIBLE SIGMOIDOSCOPY N/A 03/10/2017   Procedure: FLEXIBLE SIGMOIDOSCOPY;  Surgeon: Carol Ada, MD;  Location: [facility];  Service: Endoscopy;  Laterality: N/A;  . INCISION AND DRAINAGE PERIRECTAL ABSCESS N/A 03/01/2017   Procedure: IRRIGATION AND DEBRIDEMENT PERIRECTAL ABSCESS;  Surgeon:  Alphonsa Overall, MD;  Location: [facility];  Service: General;  Laterality: N/A;  . LAPAROSCOPY  11/23/2011   Procedure: LAPAROSCOPY DIAGNOSTIC;  Surgeon: Edward Jolly, MD;  Location: [facility];  Service: General;  Laterality: N/A;     reports that  has never smoked. she has never used smokeless tobacco. She reports that she drinks alcohol. She reports that she does not use drugs.  Allergies  Allergen Reactions  . Peanut-Containing Drug Products Anaphylaxis and Other (See Comments)    Pt states that she is allergic to [place] nuts.      . Lactose Intolerance (Gi) Other (See Comments)    Gi upset     Family History  Problem Relation Age of Onset  . Diabetes Mother   . Hypertension Father     Prior to Admission medications   Medication Sig Start Date End Date Taking? Authorizing Provider  albuterol (PROVENTIL HFA;VENTOLIN HFA) 108 (90 Base) MCG/ACT inhaler Inhale 1-2 puffs into the lungs every 4 (four) hours as needed for wheezing or shortness of breath. Patient not taking: Reported on 03/15/2017 09/02/16   Street, [place], PA-C  cholestyramine light (PREVALITE) 4 g packet Take 1 packet (4 g total) by mouth 3 (three) times daily. 03/14/17   Lavina Hamman, MD  citalopram (CELEXA) 20 MG tablet Take 1 tablet (20 mg total) by mouth daily. 03/15/17   Lavina Hamman, MD  diphenoxylate-atropine (LOMOTIL) 2.5-0.025 MG tablet Take 2 tablets by mouth 4 (four) times daily. 03/14/17   Lavina Hamman, MD  EPINEPHrine (EPIPEN 2-PAK) 0.3 mg/0.3 mL IJ  SOAJ injection Inject 0.3 mLs (0.3 mg total) into the muscle once. Patient taking differently: Inject 0.3 mg into the muscle as needed (allergic reaction).  08/10/14   Kandis Cocking A, CNM  feeding supplement, GLUCERNA SHAKE, (GLUCERNA SHAKE) LIQD Take 237 mLs by mouth 2 (two) times daily between meals. 03/14/17   Lavina Hamman, MD  ferrous sulfate 325 (65 FE) MG tablet Take 1 tablet (325 mg total) by mouth 2 (two) times daily with a meal. 03/14/17    Lavina Hamman, MD  folic acid (FOLVITE) 1 MG tablet Take 1 tablet (1 mg total) by mouth daily. 03/15/17   Lavina Hamman, MD  gabapentin (NEURONTIN) 300 MG capsule Take 1 capsule (300 mg total) by mouth 2 (two) times daily. 03/14/17   Lavina Hamman, MD  glucose 4 GM chewable tablet Chew 1 tablet (4 g total) by mouth as needed for low blood sugar. 08/10/14   Kandis Cocking A, CNM  Ibuprofen (MIDOL) 200 MG CAPS Take 400 mg by mouth daily as needed (pain).    [provider]  insulin glargine (LANTUS) 100 UNIT/ML injection Inject 0.1 mLs (10 Units total) into the skin daily. Sometimes has to adjust based on BG levels 03/14/17   Lavina Hamman, MD  insulin lispro (HUMALOG) 100 UNIT/ML injection BG 70 - 120: 0 units BG 121 - 150: 1 unit BG 151 - 200: 2 units BG 201 - 250: 3 units BG 251 - 300: 5 units BG 301 - 350: 7 units BG 351 - 400: 9 units 03/14/17   Lavina Hamman, MD  lipase/protease/amylase (CREON) 36000 UNITS CPEP capsule Take 1 capsule (36,000 Units total) by mouth 3 (three) times daily with meals. 03/14/17   Lavina Hamman, MD  loperamide (IMODIUM) 2 MG capsule Take 1 capsule (2 mg total) by mouth 3 (three) times daily before meals. DO NOT TAKE IT FOR THE DAY YOU DONT HAVE A BOWEL MOVEMENT. 03/14/17   Lavina Hamman, MD  methocarbamol (ROBAXIN) 500 MG tablet Take 1 tablet (500 mg total) by mouth every 8 (eight) hours as needed for muscle spasms. 03/14/17   Lavina Hamman, MD  traMADol (ULTRAM) 50 MG tablet Take 1 tablet (50 mg total) by mouth every 6 (six) hours as needed for moderate pain. 03/14/17   Lavina Hamman, MD    Physical Exam: Vitals:   03/16/17 2055 03/16/17 2220 03/16/17 2301 03/17/17 0036  BP:  (!) 150/105 (!) 125/93 111/70  Pulse:  98 97 95  Resp:  13 (!) 9 11  Temp:  98.4 F (36.9 C)    TempSrc:  Oral    SpO2:  98% 94% 94%  Weight: 81.6 kg (180 lb)     Height: 5' 7"  (1.702 m)         Constitutional: Moderately built and nourished. Vitals:    03/16/17 2055 03/16/17 2220 03/16/17 2301 03/17/17 0036  BP:  (!) 150/105 (!) 125/93 111/70  Pulse:  98 97 95  Resp:  13 (!) 9 11  Temp:  98.4 F (36.9 C)    TempSrc:  Oral    SpO2:  98% 94% 94%  Weight: 81.6 kg (180 lb)     Height: 5' 7"  (1.702 m)      Eyes: Anicteric no pallor. ENMT: No discharge from the ears eyes nose or mouth. Neck: No mass palpated no JVD appreciated. Respiratory: No rhonchi or crepitations. Cardiovascular: S1-S2 heard no murmurs appreciated. Abdomen: Soft nontender bowel sounds present.  Musculoskeletal: Bilateral lower extremity edema and upper extremity edema. Skin: No rash. Neurologic: Alert awake oriented to time place and person.  Moves all extremities. Psychiatric: Appears normal.  Normal affect.   Labs on Admission: I have personally reviewed following labs and imaging studies  CBC: Recent Labs  Lab 03/10/17 0532 03/15/17 1419 03/16/17 2225  WBC 4.6 5.0 5.6  NEUTROABS  --  3.7 2.8  HGB 8.0* 9.4* 8.8*  HCT 25.4* 29.3* 27.6*  MCV 95.5 95.1 95.2  PLT 397 409* 388*   Basic Metabolic Panel: Recent Labs  Lab 03/10/17 0532 03/15/17 1419 03/16/17 2225  NA 143 140 143  K 4.0 4.3 3.5  CL 106 104 108  CO2 32 28 28  GLUCOSE 205* 220* 79  BUN 10 13 8   CREATININE 0.49 0.42* 0.33*  CALCIUM 8.0* 8.2* 8.2*   GFR: Estimated Creatinine Clearance: 116.1 mL/min (A) (by C-G formula based on SCr of 0.33 mg/dL (L)). Liver Function Tests: Recent Labs  Lab 03/15/17 1419 03/16/17 2225  AST 274* 40  ALT 62* 33  ALKPHOS 230* 156*  BILITOT 0.7 0.2*  PROT 7.3 7.0  ALBUMIN 1.8* 1.8*   No results for input(s): LIPASE, AMYLASE in the last 168 hours. No results for input(s): AMMONIA in the last 168 hours. Coagulation Profile: No results for input(s): INR, PROTIME in the last 168 hours. Cardiac Enzymes: No results for input(s): CKTOTAL, CKMB, CKMBINDEX, TROPONINI in the last 168 hours. BNP (last 3 results) No results for input(s): PROBNP in the last  8760 hours. HbA1C: No results for input(s): HGBA1C in the last 72 hours. CBG: Recent Labs  Lab 03/15/17 1459 03/16/17 2101 03/16/17 2120 03/16/17 2223 03/16/17 2329  GLUCAP 196* 65 64* 83 74   Lipid Profile: No results for input(s): CHOL, HDL, LDLCALC, TRIG, CHOLHDL, LDLDIRECT in the last 72 hours. Thyroid Function Tests: No results for input(s): TSH, T4TOTAL, FREET4, T3FREE, THYROIDAB in the last 72 hours. Anemia Panel: No results for input(s): VITAMINB12, FOLATE, FERRITIN, TIBC, IRON, RETICCTPCT in the last 72 hours. Urine analysis:    Component Value Date/Time   COLORURINE YELLOW 03/16/2017 2302   APPEARANCEUR CLEAR 03/16/2017 2302   LABSPEC 1.018 03/16/2017 2302   PHURINE 6.0 03/16/2017 2302   GLUCOSEU NEGATIVE 03/16/2017 2302   HGBUR SMALL (A) 03/16/2017 2302   BILIRUBINUR NEGATIVE 03/16/2017 2302   BILIRUBINUR negative 01/02/2015 1717   KETONESUR NEGATIVE 03/16/2017 2302   PROTEINUR NEGATIVE 03/16/2017 2302   UROBILINOGEN 0.2 01/13/2015 0223   NITRITE NEGATIVE 03/16/2017 2302   LEUKOCYTESUR SMALL (A) 03/16/2017 2302   Sepsis Labs: @LABRCNTIP (procalcitonin:4,lacticidven:4) )No results found for this or any previous visit (from the past 240 hour(s)).   Radiological Exams on Admission: Ct Abdomen Pelvis W Contrast  Result Date: 03/15/2017 CLINICAL DATA:  Vomiting, diarrhea, weakness. Recent hospitalization with DKA and buttock abscess treatment. EXAM: CT ABDOMEN AND PELVIS WITH CONTRAST TECHNIQUE: Multidetector CT imaging of the abdomen and pelvis was performed using the standard protocol following bolus administration of intravenous contrast. CONTRAST:  18m ISOVUE-300 IOPAMIDOL (ISOVUE-300) INJECTION 61% COMPARISON:  02/28/2017 FINDINGS: Lower chest: Bilateral pleural effusions, slightly enlarged from before. Bibasilar subsegmental atelectasis. Hepatobiliary: No focal liver abnormality is seen. Status post cholecystectomy. No biliary dilatation. Pancreas: Atrophic  appearance. Spleen: Normal in size without focal abnormality. Adrenals/Urinary Tract: Adrenal glands are unremarkable. Kidneys are normal, without renal calculi, focal lesion, or hydronephrosis. Bladder is unremarkable. Stomach/Bowel: Stomach is within normal limits. No evidence of appendicitis. No evidence of bowel wall thickening, distention, or inflammatory  changes. Vascular/Lymphatic: No significant vascular findings are present. No enlarged abdominal or pelvic lymph nodes. Reproductive: Uterus and bilateral adnexa are unremarkable. Other: Diffuse abdominal wall edema. Improved appearance of perirectal and right buttock abscess. Small amount of phlegmonous material and gas remain in the right buttock and intragluteal fold. No drainable collections seen. Musculoskeletal: No acute or significant osseous findings. IMPRESSION: Increased bilateral pleural effusions with associated subsegmental atelectasis. Persistent moderate abdominopelvic ascites. Diffuse abdominal wall edema. Improved appearance of perirectal and right buttock abscess. Electronically Signed   By: Fidela Salisbury M.D.   On: 03/15/2017 16:38   EKG -sinus tachycardia low voltage.  Assessment/Plan Principal Problem:   Anasarca Active Problems:   Diabetes type 1, uncontrolled (HCC)   Protein-calorie malnutrition, severe ([place])    1. Anasarca likely from third spacing from malnutrition and hypoalbuminemia -patient has been placed on Lasix 40 mg IV every 12.  May need increased doses.  Closely follow intake output metabolic panel.  Check 2D echo.  During recent admission patient had Dopplers which were negative for DVT. 2. Diabetes mellitus type 1 on Lantus insulin with sliding scale coverage. 3. Recently diagnosed Crohn's with perirectal abscess -patient states he just has external dressing done noticed.  Will get wound team consult. 4. Anemia likely from Crohn's disease -follow CBC.   DVT prophylaxis: Lovenox. Code Status: Full  code. Family Communication: Discussed with patient. Disposition Plan: Home. Consults called: None. Admission status: Inpatient.   Rise Patience MD [facility] Pager [phone].  If 7PM-7AM, please contact night-coverage [email or web address] Password [email or web address]  03/17/2017, 12:50 AM

## 2017-03-17 NOTE — Discharge Summary (Addendum)
[facility] Discharge Summary   Patient: [name] OTL:[id number]   PCP: [name], MD DOB: Jul 27, 1990   Date of admission: 02/26/2017   Date of discharge: 03/14/2017     Discharge Diagnoses:  Principal Problem:   MDD (major depressive disorder), recurrent episode, mild ([place]) Active Problems:   Diabetes type 1, uncontrolled ([place])   Protein-calorie malnutrition, severe ([place])   DKA (diabetic ketoacidosis) ([place])   Diarrhea   Lower GI bleed   Admitted From: home Disposition:  home  Recommendations for Outpatient Follow-up:  1. Follow-up with PCP in 1 week.  Follow-up with gastroenterology as recommended.  Follow-up Information    Alphonsa Overall, MD Follow up.   Specialty:  General Surgery Why:  Call for follow-up appointment 2 weeks after discharge. Contact information: [place] [address] [phone]        [name], MD. Schedule an appointment as soon as possible for a visit in 1 week(s).   Specialty:  Internal Medicine Contact information: [address] [place] [address] [phone]        Juanita Craver, MD. Schedule an appointment as soon as possible for a visit in 1 month(s).   Specialty:  Gastroenterology Contact information: [address] [place] [address] [phone]          Diet recommendation: Regular diet  Activity: The patient is advised to gradually reintroduce usual activities.  Discharge Condition: good  Code Status: Full code  History of present illness: As per the H and P dictated on admission, "[name] is a 26 y.o. female with medical history significant of type 1 DM with h/o remote DKA; HTN; and gastroparesis presenting with AMS.  Today, her husband went to work early this AM.  They work at the same facility and she was due to come in later.  She called him about 11:15 and asked him to come home because she was "having a panic attack."   She specifically wanted him to help her.  She finally took a nap for a couple of hours.  She soiled herself and he decided to take her to the doctor.  She was somewhat incoherent with garbled speech and hallucinations.  For example, she would try to say the word for bathroom and would forget the word, that kind of thing.  She soiled herself again.  He thought it might be a low blood sugar and instead it was high and her temperature was low.  She has been having diarrhea since last night - awoke overnight to go to the bathroom several times.  She does have soreness and rash on her buttocks.  She also complained of a boil on her bottom last week; he thought it was better but it was noticeable today.   She has had DKA, but the last time was a few years ago.  No sick contacts.  They work for [employer] and she has been complaining of being extra tired but they thought it was due to working a lot of overtime.  She did eat/drink normally yesterday.  She drank some water when her husband got home today.  She complained of n/v to him but he did not witness this.  ED Course: Severe DKA.  AMS, better now.  T92.  PH 6.881/pCO2 20/HCO3 3.6.  On insulin drip, IVF.  PCCM recommends we admit and they see the patient tomorrow."  Hospital Course:  Summary of her active problems in the  hospital is as following. Chronic diarrhea//weight loss with anemia/malnutrition -followed by GI Dr. Collene Mares -Ct ab /pel + abscess and ascites but no bowel wall thickening -GI ordered c diff which showed antigen positive, toxin negative. Trial of vanc, but d/c'd.  - Now on creon, lomotil, imodium and cholestyramine started on 12/12; some improvement in quantity of BM's, even still loose. -will monitor -advise to maintain adequate hydration   - s/p flex sig 12/11 with biopsies taken -f follow-up with GI as an outpatien  periRectal abscess -CT imaging with complex fluids collection -s/pdebridementin ORon 12/2, -surgical culture  +Klebsiella (holding for anaerobes),zosyn d/ced, changed to ancef, with flagyl to coveranaerobes, Change to oral abx, plan to treat for total of 14 days. Last dose of abx on 12/14. -will follow [facility] any further rec's for wound care. -Patient has remained afebrile, WBCs within normal limits  DKA with metabolic encephalopathy and severe acidosis venous blood gas pH 6.8 on presentation:  -Resolved  -Gap has remained closed. -CBG's 200-300 now that she is eating more again.  Poorly controlled type 1 diabetes -H/o Medication noncompliance -Patient reports not able to afford insulin -a1c 12.2 -Diabetes educator and case manager consulted. -continue lantus and SSI; Lantus dose adjusted to 10 units daily. -CBG's stable with current regimen   Hypoglycemia event  -no further drops on CBG's; will monitor  -patient educated about importance to not skip meals -Patient Lantus adjusted to 10 units for better control.    1/4 Blood cx with lactobacillus:   Suspect this maybe contaminant (only 1 of 4 bcx with this result).   -Discussed with ID.   -Completed 14 day course of flagyl for perirectal wound as above, regardless.  -no fever and non-toxic in appearance  Acute on chronic anemia -Hemoglobin 6.4 on admission, status post PRBC transfusion x1on November 30, prbc x1 on 12/4 -B12 unremarkable, folate slightly low -transfuse prn to keep hgb >7. -FOBT+ (she had flex-sigmoidoscopy on 12/11, but this was also in setting of her perirectal abscess so not sure accuracy); no abnormalities seen.  GI was consulted, recommend outpatient follow-up  Moisture associated skin damage around anal area  -Likely from chronic diarrhea -will follow Wound care service recommendations for wound care -no significant pain reported   Hypokalemia/hypomagnesemia -Repleted and stable  Malnutrition. moderate: -CT ab/pel" Moderate volume ascites with diffuse soft tissue and body wall edema." -Albumin  1.4 -will follow Nutritional service recommendations for feeding supplement -paracentesis not helping on diagnosis/source of swelling. Most likely associated with poor nutrition.  Bilateral lower extremity edema  RUE edema:  -most likely third spacing  likely associated with low albumin and poor nutrition. -RUE [place] w/o DVT and LE duplex w/o DVT -continue nutrition supplements  -TED hoses to assist with swelling and peripheral edema.    Anxiety/ depression: -Mood stable -Continue Celexa  All other chronic medical condition were stable during the hospitalization.  Patient was ambulatory without any assistance. case Freight forwarder. On the day of the discharge the patient's vitals were stable, and no other acute medical condition were reported by patient. the patient was felt safe to be discharge at home with family.  Procedures and Results:   Perirectal abscess debridement 12/2  Paracentesis 12/12   Consultations:  General surgery  Gastroenterology   PCCM   DISCHARGE MEDICATION: Allergies as of 03/14/2017      Reactions   Peanut-containing Drug Products Anaphylaxis, Other (See Comments)   Pt states that she is allergic to [place] nuts.  Lactose Intolerance (gi) Other (See Comments)   Gi upset       Medication List    STOP taking these medications   IMODIUM A-D 1 MG/7.5ML Liqd Generic drug:  Loperamide HCl Replaced by:  loperamide 2 MG capsule   metoCLOPramide 10 MG tablet Commonly known as:  REGLAN     TAKE these medications   albuterol 108 (90 Base) MCG/ACT inhaler Commonly known as:  PROVENTIL HFA;VENTOLIN HFA Inhale 1-2 puffs into the lungs every 4 (four) hours as needed for wheezing or shortness of breath.   cholestyramine light 4 g packet Commonly known as:  PREVALITE Take 1 packet (4 g total) by mouth 3 (three) times daily.   citalopram 20 MG tablet Commonly known as:  CELEXA Take 1 tablet (20 mg total) by mouth daily.   diphenoxylate-atropine  2.5-0.025 MG tablet Commonly known as:  LOMOTIL Take 2 tablets by mouth 4 (four) times daily.   EPINEPHrine 0.3 mg/0.3 mL Soaj injection Commonly known as:  EPIPEN 2-PAK Inject 0.3 mLs (0.3 mg total) into the muscle once. What changed:    when to take this  reasons to take this   feeding supplement (GLUCERNA SHAKE) Liqd Take 237 mLs by mouth 2 (two) times daily between meals.   ferrous sulfate 325 (65 FE) MG tablet Take 1 tablet (325 mg total) by mouth 2 (two) times daily with a meal.   folic acid 1 MG tablet Commonly known as:  FOLVITE Take 1 tablet (1 mg total) by mouth daily.   gabapentin 300 MG capsule Commonly known as:  NEURONTIN Take 1 capsule (300 mg total) by mouth 2 (two) times daily.   glucose 4 GM chewable tablet Chew 1 tablet (4 g total) by mouth as needed for low blood sugar.   insulin glargine 100 UNIT/ML injection Commonly known as:  LANTUS Inject 0.1 mLs (10 Units total) into the skin daily. Sometimes has to adjust based on BG levels What changed:    how much to take  when to take this   insulin lispro 100 UNIT/ML injection Commonly known as:  HUMALOG BG 70 - 120: 0 units BG 121 - 150: 1 unit BG 151 - 200: 2 units BG 201 - 250: 3 units BG 251 - 300: 5 units BG 301 - 350: 7 units BG 351 - 400: 9 units What changed:    how much to take  how to take this  when to take this  reasons to take this  additional instructions   lipase/protease/amylase 36000 UNITS Cpep capsule Commonly known as:  CREON Take 1 capsule (36,000 Units total) by mouth 3 (three) times daily with meals.   loperamide 2 MG capsule Commonly known as:  IMODIUM Take 1 capsule (2 mg total) by mouth 3 (three) times daily before meals. DO NOT TAKE IT FOR THE DAY YOU DONT HAVE A BOWEL MOVEMENT. Replaces:  IMODIUM A-D 1 MG/7.5ML Liqd   methocarbamol 500 MG tablet Commonly known as:  ROBAXIN Take 1 tablet (500 mg total) by mouth every 8 (eight) hours as needed for muscle spasms.    MIDOL 200 MG Caps Generic drug:  Ibuprofen Take 400 mg by mouth daily as needed (pain).   traMADol 50 MG tablet Commonly known as:  ULTRAM Take 1 tablet (50 mg total) by mouth every 6 (six) hours as needed for moderate pain.      Allergies  Allergen Reactions  . Peanut-Containing Drug Products Anaphylaxis and Other (See Comments)    Pt  states that she is allergic to [place] nuts.      . Lactose Intolerance (Gi) Other (See Comments)    Gi upset    Discharge Instructions    Diet - low sodium heart healthy   Complete by:  As directed    Diet Carb Modified   Complete by:  As directed    Discharge instructions   Complete by:  As directed    It is important that you read following instructions as well as go over your medication list with RN to help you understand your care after this hospitalization.  Discharge Instructions: Please follow-up with PCP in one week  Please request your primary care physician to go over all Hospital Tests and Procedure/Radiological results at the follow up,  Please get all Hospital records sent to your PCP by signing hospital release before you go home.   Do not take more than prescribed Pain, Sleep and Anxiety Medications. You were cared for by a hospitalist during your hospital stay. If you have any questions about your discharge medications or the care you received while you were in the hospital after you are discharged, you can call the unit and ask to speak with the hospitalist on call if the hospitalist that took care of you is not available.  Once you are discharged, your primary care physician will handle any further medical issues. Please note that NO REFILLS for any discharge medications will be authorized once you are discharged, as it is imperative that you return to your primary care physician (or establish a relationship with a primary care physician if you do not have one) for your aftercare needs so that they can reassess your need for  medications and monitor your lab values. You Must read complete instructions/literature along with all the possible adverse reactions/side effects for all the Medicines you take and that have been prescribed to you. Take any new Medicines after you have completely understood and accept all the possible adverse reactions/side effects. Wear Seat belts while driving. If you have smoked or chewed Tobacco in the last 2 yrs please stop smoking and/or stop any Recreational drug use.   Increase activity slowly   Complete by:  As directed      Discharge Exam: Filed Weights   02/26/17 1708 03/11/17 1800  Weight: 52.2 kg (115 lb) 82.2 kg (181 lb 3.5 oz)   Vitals:   03/14/17 0547 03/14/17 1349  BP: (!) 132/94 115/73  Pulse: 92 94  Resp: 16 16  Temp: 98 F (36.7 C) 98 F (36.7 C)  SpO2: 95% 96%   General: Appear in no distress, no Rash; Oral Mucosa moist. Cardiovascular: S1 and S2 Present, no Murmur, no JVD Respiratory: Bilateral Air entry present and Clear to Auscultation, no Crackles, no wheezes Abdomen: Bowel Sound present, Soft and no tenderness Extremities: n Pedal edema, ono calf tenderness Neurology: Grossly no focal neuro deficit.  The results of significant diagnostics from this hospitalization (including imaging, microbiology, ancillary and laboratory) are listed below for reference.    Significant Diagnostic Studies:  Ct Abdomen Pelvis W Contrast  Result Date: 02/28/2017 CLINICAL DATA:  Chronic diarrhea.  Weight loss. EXAM: CT ABDOMEN AND PELVIS WITH CONTRAST TECHNIQUE: Multidetector CT imaging of the abdomen and pelvis was performed using the standard protocol following bolus administration of intravenous contrast. CONTRAST:  <See Chart> ISOVUE-300 IOPAMIDOL (ISOVUE-300) INJECTION 61% COMPARISON:  01/15/2016 FINDINGS: Lower chest: Small bilateral pleural effusions are associated with by basilar collapse/ consolidation. Hepatobiliary: No focal abnormality  within the liver  parenchyma. Mild periportal edema noted. Gallbladder surgically absent. No intrahepatic or extrahepatic biliary dilation. Pancreas: No focal mass lesion. No dilatation of the main duct. No intraparenchymal cyst. No peripancreatic edema. Spleen: No splenomegaly. No focal mass lesion. Adrenals/Urinary Tract: No adrenal nodule or mass. Kidneys are unremarkable. No evidence for hydroureter. Urinary bladder is decompressed by Foley catheter but bladder wall appears thick despite the decompressed state. Stomach/Bowel: Stomach is nondistended. No gastric wall thickening. No evidence of outlet obstruction. Duodenum is normally positioned as is the ligament of Treitz. No small bowel wall thickening. No small bowel dilatation. The terminal ileum is normal. The appendix is not visualized, but there is no edema or inflammation in the region of the cecum. No gross colonic mass. No colonic wall thickening. No substantial diverticular change. Vascular/Lymphatic: No abdominal aortic aneurysm. No abdominal aortic atherosclerotic calcification. Small gastrohepatic ligament lymph node without other lymphadenopathy in the abdomen. No pelvic sidewall lymphadenopathy. Reproductive: Uterus unremarkable.  There is no adnexal mass. Other: Moderate volume ascites is associated with diffuse mesenteric retroperitoneal and body wall edema. Musculoskeletal: Complex perirectal abscess tracks posteriorly into the subcutaneous fat in then up behind the sacrum. The lesion tracks caudally in the subcutaneous fat deep to the right intergluteal fold towards the perineum but has been incompletely visualized in its lower extent. IMPRESSION: 1. Complex perirectal fluid collection tracks up behind the sacrum and down along the right intergluteal fold towards the perineum although inferior extent of the abscess has been incompletely visualized. 2. Moderate volume ascites with diffuse soft tissue and body wall edema. 3. Bilateral lower lung  collapse/consolidation with small bilateral pleural effusions. Electronically Signed   By: Misty Stanley M.D.   On: 02/28/2017 13:56   US Paracentesis  Result Date: 03/12/2017 INDICATION: Patient with history of diarrhea, low albumin/malnutrition, diabetes, ascites, perirectal fluid collection; request made for diagnostic and therapeutic paracentesis. EXAM: ULTRASOUND GUIDED DIAGNOSTIC PARACENTESIS MEDICATIONS: None. COMPLICATIONS: None immediate. PROCEDURE: Informed written consent was obtained from the patient after a discussion of the risks, benefits and alternatives to treatment. A timeout was performed prior to the initiation of the procedure. Initial ultrasound scanning demonstrates a small amount of ascites within the right mid to lower abdominal quadrant. The right mid to lower abdomen was prepped and draped in the usual sterile fashion. 2% lidocaine was used for local anesthesia. Following this, a Yueh catheter was introduced. An ultrasound image was saved for documentation purposes. The paracentesis was performed. The catheter was removed and a dressing was applied. The patient tolerated the procedure well without immediate post procedural complication. FINDINGS: A total of approximately 65 cc of clear, colorless fluid was removed. Samples were sent to the laboratory as requested by the clinical team. IMPRESSION: Successful ultrasound-guided diagnostic paracentesis yielding 65 cc of peritoneal fluid. Read by: Rowe Robert, PA-C Electronically Signed   By: Jerilynn Mages.  Shick M.D.   On: 03/11/2017 17:51    Dg Chest Port 1 View  Result Date: 02/26/2017 CLINICAL DATA:  26 year old female with weakness and fatigue. EXAM: PORTABLE CHEST 1 VIEW COMPARISON:  12/14/2016 FINDINGS: The heart size and mediastinal contours are within normal limits. No focal parenchymal opacity or sizable pleural effusion. Small pneumothorax difficult to definitively exclude on supine only radiographs. The visualized skeletal  structures are unremarkable. IMPRESSION: No active disease. Electronically Signed   By: Kristopher Oppenheim M.D.   On: 02/26/2017 17:30    Microbiology: No results found for this or any previous visit (from the past 240 hour(s)).  Time spent: 35 minutes  Signed:  Berle Mull  [facility] 03/14/2017   , 8:56 AM

## 2017-03-18 ENCOUNTER — Inpatient Hospital Stay (HOSPITAL_COMMUNITY): Payer: 59

## 2017-03-18 DIAGNOSIS — E43 Unspecified severe protein-calorie malnutrition: Secondary | ICD-10-CM

## 2017-03-18 DIAGNOSIS — E10649 Type 1 diabetes mellitus with hypoglycemia without coma: Secondary | ICD-10-CM

## 2017-03-18 LAB — GLUCOSE, CAPILLARY
Glucose-Capillary: 290 mg/dL — ABNORMAL HIGH (ref 65–99)
Glucose-Capillary: 254 mg/dL — ABNORMAL HIGH (ref 65–99)
Glucose-Capillary: 89 mg/dL (ref 65–99)
Glucose-Capillary: 129 mg/dL — ABNORMAL HIGH (ref 65–99)

## 2017-03-18 LAB — CORTISOL-AM, BLOOD: Cortisol - AM: 15 ug/dL (ref 6.7–22.6)

## 2017-03-18 LAB — BASIC METABOLIC PANEL
Anion gap: 9 (ref 5–15)
BUN: 8 mg/dL (ref 6–20)
CO2: 31 mmol/L (ref 22–32)
Calcium: 8.2 mg/dL — ABNORMAL LOW (ref 8.9–10.3)
Chloride: 98 mmol/L — ABNORMAL LOW (ref 101–111)
Creatinine, Ser: 0.46 mg/dL (ref 0.44–1.00)
GFR calc Af Amer: >60 mL/min (ref >60)
GFR calc non Af Amer: >60 mL/min (ref >60)
Glucose, Bld: 270 mg/dL — ABNORMAL HIGH (ref 65–99)
Potassium: 4 mmol/L (ref 3.5–5.1)
Sodium: 138 mmol/L (ref 135–145)

## 2017-03-18 LAB — CBC
HCT: 30 % — ABNORMAL LOW (ref 36.0–46.0)
Hemoglobin: 9.2 g/dL — ABNORMAL LOW (ref 12.0–15.0)
MCH: 29.4 pg (ref 26.0–34.0)
MCHC: 30.7 g/dL (ref 30.0–36.0)
MCV: 95.8 fL (ref 78.0–100.0)
Platelets: 411 10*3/uL — ABNORMAL HIGH (ref 150–400)
RBC: 3.13 MIL/uL — ABNORMAL LOW (ref 3.87–5.11)
RDW: 14.1 % (ref 11.5–15.5)
WBC: 5.7 10*3/uL (ref 4.0–10.5)

## 2017-03-18 LAB — PREALBUMIN: Prealbumin: 18.1 mg/dL (ref 18–38)

## 2017-03-18 IMAGING — US US ABDOMEN LIMITED
1 series · 10 of 10 positions shown · non-contrast
Comparison: CT scan of the abdomen and pelvis [DATE]

CLINICAL DATA: 26-year-old female with anasarca and possible
ascites.

EXAM:
LIMITED ABDOMEN ULTRASOUND FOR ASCITES
TECHNIQUE: Limited ultrasound survey for ascites was performed in all four
abdominal quadrants.

[Series 1: us abdomen limited · 0.26mm/px · 10 of 10 slices shown]
[im 1/10]
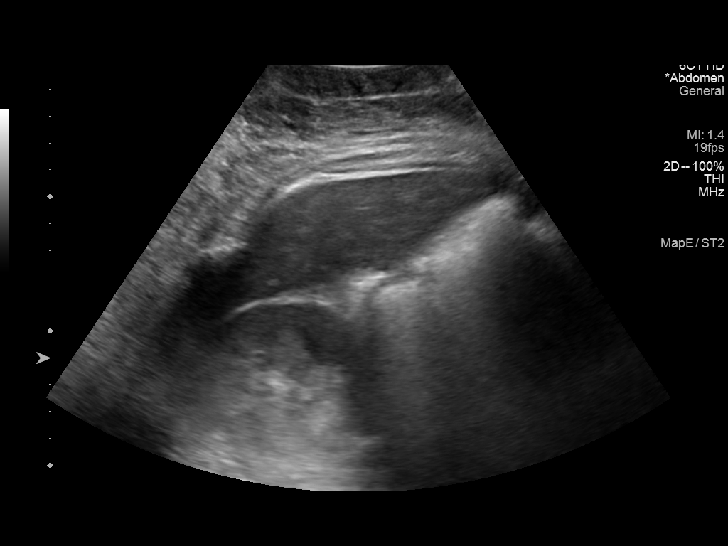
[im 2/10]
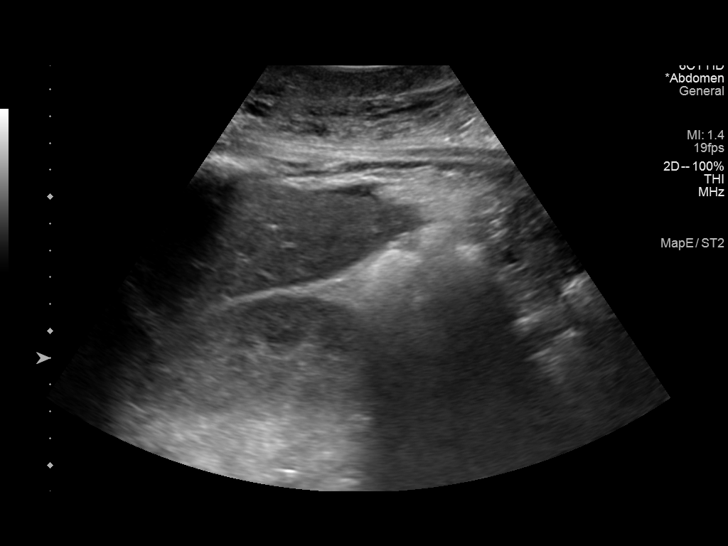
[im 3/10]
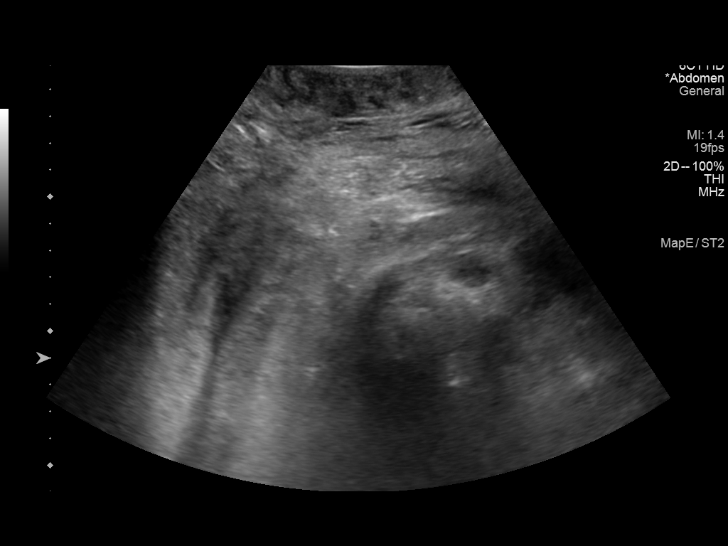
[im 4/10]
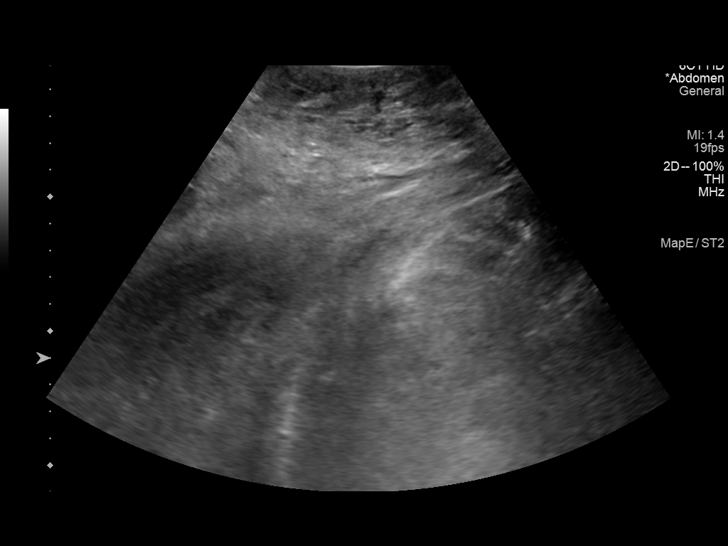
[im 5/10]
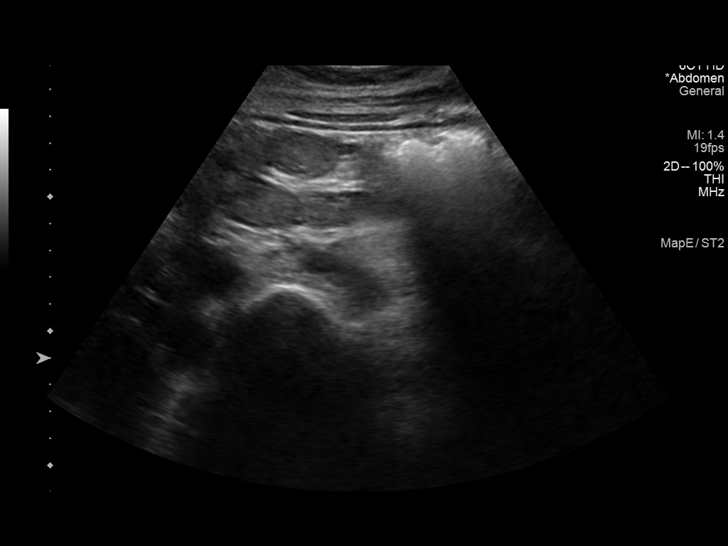
[im 6/10]
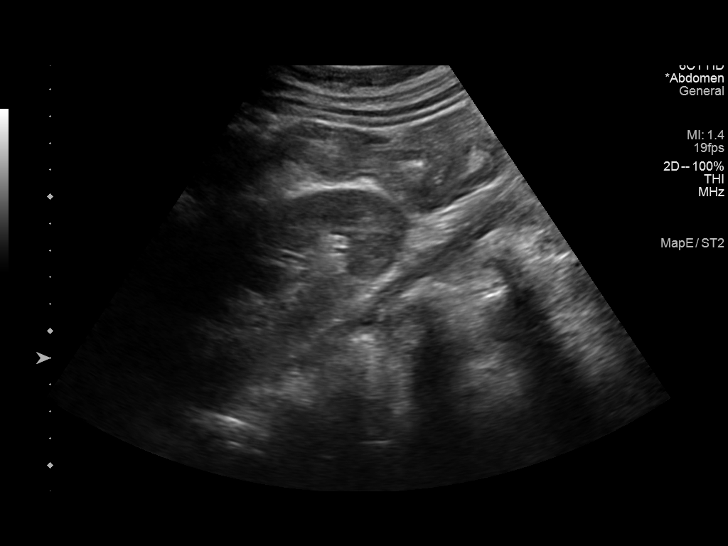
[im 7/10]
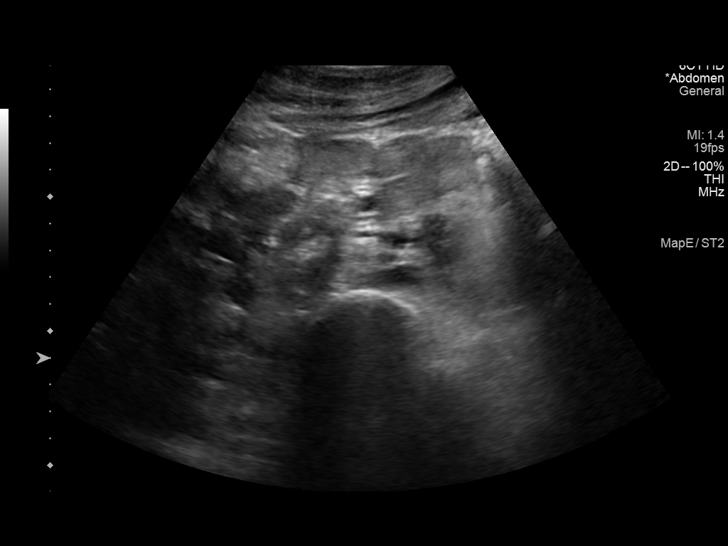
[im 8/10]
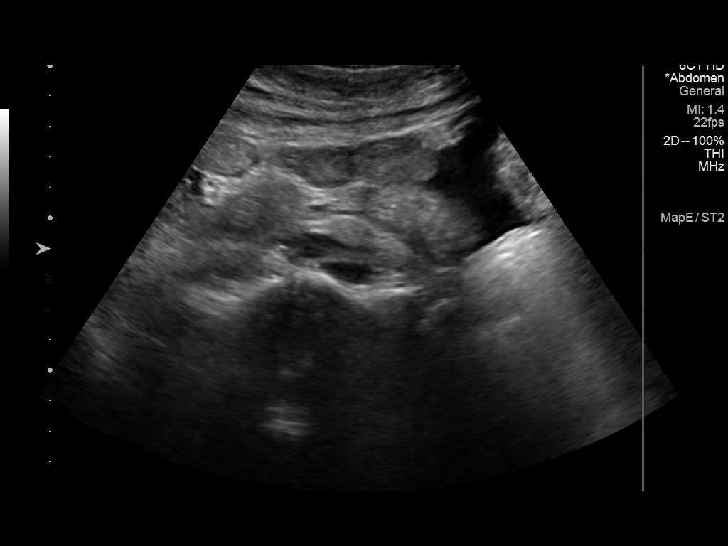
[im 9/10]
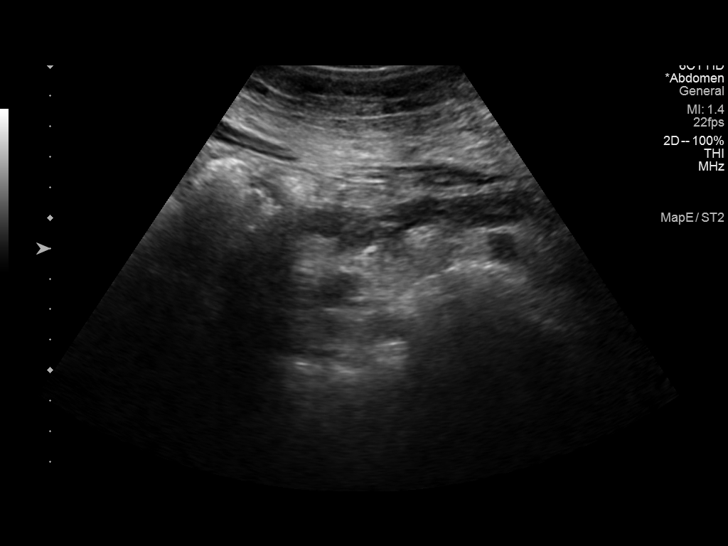
[im 10/10]
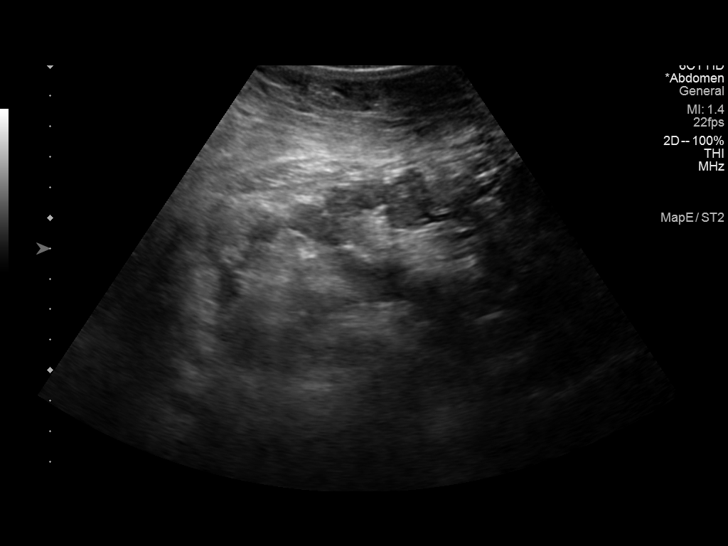

[10 of 10 positions shown; findings below may reference images not displayed]

FINDINGS: Sonographic interrogation of the 4 quadrants of the abdomen
demonstrates trace ascites. There is insufficient fluid for
paracentesis.
IMPRESSION: 1. Trace ascites.
2. Paracentesis was deferred.

## 2017-03-18 MED ORDER — GLUCERNA SHAKE PO LIQD
237.0000 mL | Freq: Three times a day (TID) | ORAL | Status: DC
  Administered 2017-03-18 – 2017-03-21 (×8): 237 mL via ORAL
  Filled 2017-03-18 (×11): qty 237

## 2017-03-18 MED ORDER — INSULIN GLARGINE 100 UNIT/ML ~~LOC~~ SOLN
10.0000 [IU] | Freq: Every day | SUBCUTANEOUS | Status: DC
  Administered 2017-03-18 – 2017-03-21 (×4): 10 [IU] via SUBCUTANEOUS
  Filled 2017-03-18 (×4): qty 0.1

## 2017-03-18 MED ORDER — MORPHINE SULFATE (PF) 4 MG/ML IV SOLN
6.0000 mg | INTRAVENOUS | Status: DC | PRN
  Administered 2017-03-18 – 2017-03-21 (×24): 6 mg via INTRAVENOUS
  Filled 2017-03-18 (×25): qty 2

## 2017-03-18 MED ORDER — ADULT MULTIVITAMIN W/MINERALS CH
1.0000 | ORAL_TABLET | Freq: Every day | ORAL | Status: DC
  Administered 2017-03-18 – 2017-03-21 (×4): 1 via ORAL
  Filled 2017-03-18 (×4): qty 1

## 2017-03-18 NOTE — Progress Notes (Signed)
Initial Nutrition Assessment  DOCUMENTATION CODES:   Non-severe (moderate) malnutrition in context of chronic illness  INTERVENTION:    Glucerna Shake po TID, each supplement provides 220 kcal and 10 grams of protein  Provide MVI daily  NUTRITION DIAGNOSIS:   Moderate Malnutrition related to chronic illness(T1DM- non compliance) as evidenced by moderate fat depletion, moderate muscle depletion, edema.  GOAL:   Patient will meet greater than or equal to 90% of their needs  MONITOR:   PO intake, Supplement acceptance, Labs, Weight trends, I & O's  REASON FOR ASSESSMENT:   Consult Assessment of nutrition requirement/status  ASSESSMENT:   Pt with PMH significant for Crohn's disease, gastroparesis, HTN, and T1DM. Recently admitted 12/3 for perirectal abscess and DKA. Presents this admission with increasing peripheral edema and shortness of breath. Admitted for anasarca likely from third spacing from malnutrition.    Pt familiar to this RD. Pt was diagnosed with moderate malnutrition 12/4  that continued throughout her admission. Pt's course was complicated with ongoing diarrhea that resolved a week ago. Appetite was off and on due to fear of these symptoms. Prior to discharge on 12/15 pt was consuming an average of 73%. Her appetite was stable for the two days at home, consuming 3 meals with Glucerna each day. Yesterday pt experienced feelings of early satiety due to abdominal fluid accumulation that has resolved today. Weight noted to drastically increase since discharge but is steadily coming back down with Lasix. Recommend to check daily weights to monitor trends. Nutrition-Focused physical exam completed. Unable to assess bilateral lower extremities due to severe edema. Suspect they remain moderately depleted since discharge. Will continue with supplementation for increased protein needs. Will add MVI to aid in wound healing.   Medications reviewed and include: ferrous sulfate,  folic acid, SSI, lantus, creon, lasix Labs reviewed: Cl 98 (L) CBG 113-270 ALP 146 (H) AST 44 (H)   NUTRITION - FOCUSED PHYSICAL EXAM:    Most Recent Value  Orbital Region  Mild depletion  Upper Arm Region  Moderate depletion  Thoracic and Lumbar Region  Unable to assess  Buccal Region  Moderate depletion  Temple Region  Moderate depletion  Clavicle Bone Region  Moderate depletion  Clavicle and Acromion Bone Region  Moderate depletion  Scapular Bone Region  Unable to assess  Dorsal Hand  Moderate depletion  Patellar Region  Unable to assess  Anterior Thigh Region  Unable to assess  Posterior Calf Region  Unable to assess  Edema (RD Assessment)  Moderate  Hair  Reviewed  Eyes  Reviewed  Mouth  Reviewed  Skin  Reviewed  Nails  Reviewed       Diet Order:  Diet heart healthy/carb modified Room service appropriate? Yes; Fluid consistency: Thin; Fluid restriction: 1200 mL Fluid  EDUCATION NEEDS:   Education needs have been addressed  Skin:  Skin Assessment: Skin Integrity Issues: Skin Integrity Issues:: Incisions Incisions: perineum  Last BM:  03/18/17  Height:   Ht Readings from Last 1 Encounters:  03/17/17 5\' 7"  (1.702 m)    Weight:   Wt Readings from Last 1 Encounters:  03/18/17 169 lb 15.6 oz (77.1 kg)    Ideal Body Weight:  61.4 kg  BMI:  Body mass index is 26.62 kg/m.  Estimated Nutritional Needs:   Kcal:  1600-1800 kcal/day  Protein:  80-90 g/day  Fluid:  >1.6 L/day    Vanessa Kick RD, LDN Clinical Nutrition Pager # - [phone]

## 2017-03-18 NOTE — Progress Notes (Signed)
PROGRESS NOTE  [name]  JKD:[id number] DOB: 06/24/1990 DOA: 03/16/2017 PCP: Andi Devon, MD  Outpatient Specialists: GI, Dr. Loreta Ave Brief Narrative: [name] is a 26 y.o. female with a history of recently diagnosed Crohn's disease, T1DM who presented to the ED with swelling of her entire body, having gained about 65 pounds in the previous 3 weeks. She had recently been discharged after an admission for DKA and perirectal abscess that required operative debridement. Her per oral intake has been diminished and she has had 3+ loose stools per day which is about baseline. On exam she exhibited anasarca, CXR demonstrating interstitial edema and pleural effusion. Albumin 1.8, hgb 8.8, WBC normal at 5.6. Urinalysis with no proteinuria. Lasix was administered and nutrition consulted. Echo shows no CHF, US shows no ascites.   Assessment & Plan: Principal Problem:   Anasarca Active Problems:   Diabetes type 1, uncontrolled (HCC)   Protein-calorie malnutrition, severe (HCC)  Anasarca: Suspect due to severe protein calorie malnutrition/hypoalbuminemia due to malabsorption from Crohn's disease. No CHF or proteinuria. During recent admission (discharged 12/15) patient had LE dopplers negative for DVT. No ascites on U/S 12/19. - Continue lasix IV BID, UOP not completely charted due to concomitant diarrhea. Weights down significantly.  - Continue I/O monitoring as able, daily weights, BMP - Multivitamin, nutrition consulted.   T1DM: Recent HbA1c 12.2% - Will restart lantus and continue SSI.  Perirectal abscess: s/p operative debridement 12/2, completed culture-driven antibiotics 12/14. Afebrile, no leukocytosis or recent worsening of wound. Several severe risk factors for poor wound healing including poor DM control, poor nutritional status.  - Wound care per general surgery recommendations (consult note 12/18)  - Pain control as ordered, will increase morphine IV dose today, continue  percocet as well.  Crohn's disease: Recently Dx. s/p flex sigmoidoscopy 12/11 with biopsies showing benign mucosa without inflammation or dysplasia. - If diarrhea worsens, will seek GI input. - Continue cholestyramine, lomotil, imodium, creon scheduled for diarrhea. Recently checked for CDiff with +Ag, negative toxin.   Anemia: Chronic, required transfusion during previous hospitalization. Anemia panel showed sufficient B12, low folate, and no iron deficiency (on iron supplement).  - CBC stable. - Continue iron, folate supplements and multivitamin  DVT prophylaxis: Lovenox Code Status: Full Family Communication: None at bedside Disposition Plan: Uncertain  Consultants:   General surgery  Procedures:   Echocardiogram 03/17/2017: Study Conclusions - Left ventricle: The cavity size was normal. There is mild LVH.   Systolic function was normal. The estimated ejection fraction was   in the range of 60% to 65%. Wall motion was normal; there were no   regional wall motion abnormalities. Left ventricular diastolic   function parameters were normal. - Mitral valve: Mildly thickened leaflets . There was mild   regurgitation. - Left atrium: The atrium was mildly dilated. - Right atrium: The atrium was normal in size. - Tricuspid valve: There was mild regurgitation. - Pulmonary arteries: PA peak pressure: 29 mm Hg (S). - Inferior vena cava: The vessel was normal in size. The   respirophasic diameter changes were in the normal range (>= 50%),   consistent with normal central venous pressure.  Impressions: - Compared to a prior study in 2015, there is now mild LVH.  US abdomen 12/19: No significant ascites.  Antimicrobials:  None   Subjective: Taking better po, abdominal pain is not well controlled. Did not receive home antimotility medications in ED the previous night, so had 3-4 episodes of NB diarrhea yesterday. None yet  today.  Objective: Vitals:   03/17/17 0548 03/17/17  0620 03/17/17 2134 03/18/17 0608  BP: 119/68  (!) 137/97 (!) 160/103  Pulse: 97  92 97  Resp: 16  16 16   Temp: 98.1 F (36.7 C)  98 F (36.7 C) (!) 97.5 F (36.4 C)  TempSrc: Oral  Oral Oral  SpO2: 94%  99% 94%  Weight:  79.2 kg (174 lb 8 oz)  77.1 kg (169 lb 15.6 oz)  Height:       No intake or output data in the 24 hours ending 03/18/17 1216 Filed Weights   03/17/17 0124 03/17/17 0620 03/18/17 [id number]  Weight: 82.1 kg (181 lb 1.6 oz) 79.2 kg (174 lb 8 oz) 77.1 kg (169 lb 15.6 oz)    Gen: Chronically ill-appearing female in no distress  Pulm: Non-labored breathing room air. Decreased at bases.   CV: Regular rate and rhythm. No murmur, rub, or gallop. No JVD, 2+ pitting pedal edema. GI: Abdomen soft, diffusely tender without rebound, non-distended, with normoactive bowel sounds. Abd wall edema, though no fluid wave. Ext: Warm, no deformities Skin: Right buttock abscess dressing c/d/i, wound not examined per pt preference. See picture on consult note from surgery 12/18.  Neuro: Alert and oriented. No focal neurological deficits. Psych: Depressed mildly in mood, broad affect. Appropriate behavior.  CBC: Recent Labs  Lab 03/15/17 1419 03/16/17 2225 03/17/17 0411 03/18/17 0555  WBC 5.0 5.6 5.6 5.7  NEUTROABS 3.7 2.8  --   --   HGB 9.4* 8.8* 8.7* 9.2*  HCT 29.3* 27.6* 27.7* 30.0*  MCV 95.1 95.2 95.8 95.8  PLT 409* 417* 409* 411*   Basic Metabolic Panel: Recent Labs  Lab 03/15/17 1419 03/16/17 2225 03/17/17 0411 03/18/17 0555  NA 140 143 144 138  K 4.3 3.5 3.4* 4.0  CL 104 108 107 98*  CO2 28 28 30 31   GLUCOSE 220* 79 113* 270*  BUN 13 8 8 8   CREATININE 0.42* 0.33* 0.34* 0.46  CALCIUM 8.2* 8.2* 8.2* 8.2*   GFR: Estimated Creatinine Clearance: 113.1 mL/min (by C-G formula based on SCr of 0.46 mg/dL). Liver Function Tests: Recent Labs  Lab 03/15/17 1419 03/16/17 2225 03/17/17 0411  AST 274* 40 44*  ALT 62* 33 31  ALKPHOS 230* 156* 146*  BILITOT 0.7 0.2* 0.2*    PROT 7.3 7.0 6.8  ALBUMIN 1.8* 1.8* 1.8*   CBG: Recent Labs  Lab 03/17/17 1159 03/17/17 1622 03/17/17 2130 03/18/17 0754 03/18/17 1143  GLUCAP 130* 154* 162* 290* 254*   Urine analysis:    Component Value Date/Time   COLORURINE YELLOW 03/16/2017 2302   APPEARANCEUR CLEAR 03/16/2017 2302   LABSPEC 1.018 03/16/2017 2302   PHURINE 6.0 03/16/2017 2302   GLUCOSEU NEGATIVE 03/16/2017 2302   HGBUR SMALL (A) 03/16/2017 2302   BILIRUBINUR NEGATIVE 03/16/2017 2302   BILIRUBINUR negative 01/02/2015 1717   KETONESUR NEGATIVE 03/16/2017 2302   PROTEINUR NEGATIVE 03/16/2017 2302   UROBILINOGEN 0.2 01/13/2015 0223   NITRITE NEGATIVE 03/16/2017 2302   LEUKOCYTESUR SMALL (A) 03/16/2017 2302   Radiology Studies: [place] Abdomen Limited  Result Date: 03/18/2017 CLINICAL DATA:  26 year old female with anasarca and possible ascites. EXAM: LIMITED ABDOMEN ULTRASOUND FOR ASCITES TECHNIQUE: Limited ultrasound survey for ascites was performed in all four abdominal quadrants. COMPARISON:  CT scan of the abdomen and pelvis 03/05/2017 FINDINGS: Sonographic interrogation of the 4 quadrants of the abdomen demonstrates trace ascites. There is insufficient fluid for paracentesis. IMPRESSION: 1. Trace ascites. 2. Paracentesis was deferred. Electronically  Signed   By: Malachy Moan M.D.   On: 03/18/2017 11:29   Dg Chest Port 1 View  Result Date: 03/17/2017 CLINICAL DATA:  Initial evaluation for acute shortness of breath, anasarca. EXAM: PORTABLE CHEST 1 VIEW COMPARISON:  Prior radiograph from 02/26/2017. FINDINGS: Patient is rotated to the right. Allowing for rotation, cardiac and mediastinal silhouettes are stable in size and contour, and remain within normal limits. Lungs hypoinflated. Diffuse vascular and interstitial prominence suggesting diffuse pulmonary interstitial edema. Layering right pleural effusion. Associated right basilar opacity favored to reflect edema and/or effusion with atelectasis.  Infiltrate could be considered in the correct clinical setting. No pneumothorax. Osseous structures within normal limits. IMPRESSION: 1. Findings suggestive of diffuse pulmonary interstitial edema. 2. Small to moderate layering right pleural effusion. Associated right basilar opacity favored to reflect edema and/or atelectasis. Infiltrate could be considered in the correct clinical setting. Electronically Signed   By: Rise Mu M.D.   On: 03/17/2017 02:28    Scheduled Meds: . cholestyramine light  4 g Oral TID  . citalopram  20 mg Oral Daily  . enoxaparin (LOVENOX) injection  40 mg Subcutaneous QHS  . feeding supplement (GLUCERNA SHAKE)  237 mL Oral BID BM  . ferrous sulfate  325 mg Oral BID WC  . folic acid  1 mg Oral Daily  . furosemide  40 mg Intravenous Q12H  . gabapentin  300 mg Oral BID  . insulin aspart  0-9 Units Subcutaneous TID WC  . insulin glargine  10 Units Subcutaneous Daily  . lipase/protease/amylase  36,000 Units Oral TID WC  . loperamide  2 mg Oral TID AC   Continuous Infusions:   LOS: 2 days   Time spent: 25 minutes.  Hazeline Junker, MD [facility] Pager [phone]  If 7PM-7AM, please contact night-coverage [email or web address] Password [email or web address] 03/18/2017, 12:16 PM

## 2017-03-18 NOTE — Progress Notes (Signed)
Patient presented to ultrasound department today for paracentesis.  On limited ultrasound of abdomen in all 4 quadrants there is no significant ascites amenable to safe aspiration at this time.  Procedure canceled.  Patient notified.

## 2017-03-19 ENCOUNTER — Institutional Professional Consult (permissible substitution): Payer: 59 | Admitting: Family Medicine

## 2017-03-19 DIAGNOSIS — E440 Moderate protein-calorie malnutrition: Secondary | ICD-10-CM

## 2017-03-19 LAB — MAGNESIUM: Magnesium: 1.9 mg/dL (ref 1.7–2.4)

## 2017-03-19 LAB — GLUCOSE, CAPILLARY
Glucose-Capillary: 170 mg/dL — ABNORMAL HIGH (ref 65–99)
Glucose-Capillary: 109 mg/dL — ABNORMAL HIGH (ref 65–99)
Glucose-Capillary: 162 mg/dL — ABNORMAL HIGH (ref 65–99)
Glucose-Capillary: 155 mg/dL — ABNORMAL HIGH (ref 65–99)

## 2017-03-19 LAB — BASIC METABOLIC PANEL
Anion gap: 3 — ABNORMAL LOW (ref 5–15)
BUN: 10 mg/dL (ref 6–20)
CO2: 34 mmol/L — ABNORMAL HIGH (ref 22–32)
Calcium: 8 mg/dL — ABNORMAL LOW (ref 8.9–10.3)
Chloride: 102 mmol/L (ref 101–111)
Creatinine, Ser: 0.43 mg/dL — ABNORMAL LOW (ref 0.44–1.00)
GFR calc Af Amer: >60 mL/min (ref >60)
GFR calc non Af Amer: >60 mL/min (ref >60)
Glucose, Bld: 177 mg/dL — ABNORMAL HIGH (ref 65–99)
Potassium: 4.1 mmol/L (ref 3.5–5.1)
Sodium: 139 mmol/L (ref 135–145)

## 2017-03-19 NOTE — Progress Notes (Signed)
[facility]  PROGRESS NOTE  [name] IRS:[id number] DOB: [birth year] DOA: 03/16/2017 PCP: [name], MD   Brief HPI:    26 y.o. female with a history of recently diagnosed Crohn's disease, T1DM who presented to the ED with swelling of her entire body, having gained about 65 pounds in the previous 3 weeks. She had recently been discharged after an admission for DKA and perirectal abscess that required operative debridement. Her per oral intake has been diminished and she has had 3+ loose stools per day which is about baseline. On exam she exhibited anasarca, CXR demonstrating interstitial edema and pleural effusion. Albumin 1.8, hgb 8.8, WBC normal at 5.6. Urinalysis with no proteinuria.       Subjective   Patient reports improved swelling. Good diuresis with Lasix.   Assessment/Plan:     1. Anasarca- secondary to protein calorie malnutrition/hyperlipidemia due to malabsorption from Crohn's disease. Patient had lower extremity Dopplers done recently which was negative for DVT. No ascites seen on abdominal ultrasound. Continue IV Lasix. Patient states has improved with IV Lasix. 2. Perirectal abscess- status post debridement on 03/01/17. Completed antibiotics on 03/13/2017. General surgery following. Pain controlled with morphine, Percocet. 3. Crohn's disease-diagnosed on flexible sigmoidoscopy on 03/10/2017. The biopsy showing benign mucosa without inflammation or dysplasia. Continue cholestyramine, Lomotil, Imodium, Creon. Recently checked for C. difficile was positive antigen and  negative for toxin. 4. Anemia-chronic, required blood transfusion during previous hospitalization. Anemia panel showed sufficient B12, low folate, and no iron deficiency (on iron supplementation). Continue iron, folate supplements and multivitamin. 5. Type 1 diabetes mellitus- hemoglobin A1c 12.2, continue Lantus and sliding scale insulin. Blood glucose is well controlled.    DVT  prophylaxis: Lovenox  Code Status: Full code  Family Communication: No family at bedside.   Disposition Plan: Likely home in a.m.   Consultants:  General surgery  Procedures:  Echocardiogram  Continuous infusions     Antibiotics:   Anti-infectives (From admission, onward)   None       Objective   Vitals:   03/18/17 1301 03/18/17 2109 03/19/17 0552 03/19/17 1408  BP: (!) 127/92 106/73 127/90 127/87  Pulse: 98 95 92 (!) 103  Resp: 16 16 16 18   Temp: 97.8 F (36.6 C) 97.8 F (36.6 C) 97.8 F (36.6 C) 98 F (36.7 C)  TempSrc: Axillary Oral Oral Oral  SpO2: 97% 95% 97% 97%  Weight:      Height:        Intake/Output Summary (Last 24 hours) at 03/19/2017 1627 Last data filed at 03/19/2017 1408 Gross per 24 hour  Intake 1080 ml  Output -  Net 1080 ml   Filed Weights   03/17/17 0124 03/17/17 0620 03/18/17 0608  Weight: 82.1 kg (181 lb 1.6 oz) 79.2 kg (174 lb 8 oz) 77.1 kg (169 lb 15.6 oz)     Physical Examination:   Physical Exam: Eyes: No icterus, extraocular muscles intact  Mouth: Oral mucosa is moist, no lesions on palate,  Neck: Supple, no deformities, masses, or tenderness Lungs: Normal respiratory effort, bilateral clear to auscultation, no crackles or wheezes.  Heart: Regular rate and rhythm, S1 and S2 normal, no murmurs, rubs auscultated Abdomen: BS normoactive,soft,nondistended,non-tender to palpation,no organomegaly Extremities: 2+ pitting edema noted in all 4 extremities, except right lower extremity which has 3+ pitting edema Neuro : Alert and oriented to time, place and person, No focal deficits Skin: No rashes seen on exam    Data Reviewed: I have personally reviewed following labs  and imaging studies  CBG: Recent Labs  Lab 03/18/17 1143 03/18/17 1620 03/18/17 2107 03/19/17 0735 03/19/17 1139  GLUCAP 254* 89 129* 170* 109*    CBC: Recent Labs  Lab 03/15/17 1419 03/16/17 2225 03/17/17 0411 03/18/17 0555  WBC 5.0 5.6  5.6 5.7  NEUTROABS 3.7 2.8  --   --   HGB 9.4* 8.8* 8.7* 9.2*  HCT 29.3* 27.6* 27.7* 30.0*  MCV 95.1 95.2 95.8 95.8  PLT 409* 417* 409* 411*    Basic Metabolic Panel: Recent Labs  Lab 03/15/17 1419 03/16/17 2225 03/17/17 0411 03/18/17 0555 03/19/17 0401  NA 140 143 144 138 139  K 4.3 3.5 3.4* 4.0 4.1  CL 104 108 107 98* 102  CO2 28 28 30 31  34*  GLUCOSE 220* 79 113* 270* 177*  BUN 13 8 8 8 10   CREATININE 0.42* 0.33* 0.34* 0.46 0.43*  CALCIUM 8.2* 8.2* 8.2* 8.2* 8.0*  MG  --   --   --   --  1.9    No results found for this or any previous visit (from the past 240 hour(s)).   Liver Function Tests: Recent Labs  Lab 03/15/17 1419 03/16/17 2225 03/17/17 0411  AST 274* 40 44*  ALT 62* 33 31  ALKPHOS 230* 156* 146*  BILITOT 0.7 0.2* 0.2*  PROT 7.3 7.0 6.8  ALBUMIN 1.8* 1.8* 1.8*   No results for input(s): LIPASE, AMYLASE in the last 168 hours. No results for input(s): AMMONIA in the last 168 hours.  Cardiac Enzymes: No results for input(s): CKTOTAL, CKMB, CKMBINDEX, TROPONINI in the last 168 hours. BNP (last 3 results) No results for input(s): BNP in the last 8760 hours.  ProBNP (last 3 results) No results for input(s): PROBNP in the last 8760 hours.    Studies: US Abdomen Limited  Result Date: 03/18/2017 CLINICAL DATA:  26 year old female with anasarca and possible ascites. EXAM: LIMITED ABDOMEN ULTRASOUND FOR ASCITES TECHNIQUE: Limited ultrasound survey for ascites was performed in all four abdominal quadrants. COMPARISON:  CT scan of the abdomen and pelvis 03/05/2017 FINDINGS: Sonographic interrogation of the 4 quadrants of the abdomen demonstrates trace ascites. There is insufficient fluid for paracentesis. IMPRESSION: 1. Trace ascites. 2. Paracentesis was deferred. Electronically Signed   By: Jacqulynn Cadet M.D.   On: 03/18/2017 11:29    Scheduled Meds: . cholestyramine light  4 g Oral TID  . citalopram  20 mg Oral Daily  . enoxaparin (LOVENOX)  injection  40 mg Subcutaneous QHS  . feeding supplement (GLUCERNA SHAKE)  237 mL Oral TID BM  . ferrous sulfate  325 mg Oral BID WC  . folic acid  1 mg Oral Daily  . furosemide  40 mg Intravenous Q12H  . gabapentin  300 mg Oral BID  . insulin aspart  0-9 Units Subcutaneous TID WC  . insulin glargine  10 Units Subcutaneous Daily  . lipase/protease/amylase  36,000 Units Oral TID WC  . loperamide  2 mg Oral TID AC  . multivitamin with minerals  1 tablet Oral Daily      Time spent: 20 min  [facility] Pager [phone]. If 7PM-7AM, please contact night-coverage at [email or web address], Office  [phone]  password [email or web address]  03/19/2017, 4:27 PM  LOS: 3 days

## 2017-03-19 NOTE — Progress Notes (Signed)
   Subjective/Chief Complaint: Getting better slowly. Still w pain from the wound.   Objective: Vital signs in last 24 hours: Temp:  [97.8 F (36.6 C)] 97.8 F (36.6 C) (12/20 0552) Pulse Rate:  [92-98] 92 (12/20 0552) Resp:  [16] 16 (12/20 0552) BP: (106-127)/(73-92) 127/90 (12/20 0552) SpO2:  [95 %-97 %] 97 % (12/20 0552) Last BM Date: 03/18/17  Intake/Output from previous day: 12/19 0701 - 12/20 0700 In: 940 [P.O.:940] Out: -  Intake/Output this shift: No intake/output data recorded.  General appearance: alert and cooperative Resp: unalbored Skin: Skin color, texture, turgor normal. No rashes or lesions Incision/Wound: perineal wound has beefy granulation, no fibrinous or purulent exudate, notably improved compared to Tuesday. No surrounding induration, erythema or warmth  Lab Results:  Recent Labs    03/17/17 0411 03/18/17 0555  WBC 5.6 5.7  HGB 8.7* 9.2*  HCT 27.7* 30.0*  PLT 409* 411*   BMET Recent Labs    03/18/17 0555 03/19/17 0401  NA 138 139  K 4.0 4.1  CL 98* 102  CO2 31 34*  GLUCOSE 270* 177*  BUN 8 10  CREATININE 0.46 0.43*  CALCIUM 8.2* 8.0*   PT/INR No results for input(s): LABPROT, INR in the last 72 hours. ABG No results for input(s): PHART, HCO3 in the last 72 hours.  Invalid input(s): PCO2, PO2  Studies/Results: US Abdomen Limited  Result Date: 03/18/2017 CLINICAL DATA:  26 year old female with anasarca and possible ascites. EXAM: LIMITED ABDOMEN ULTRASOUND FOR ASCITES TECHNIQUE: Limited ultrasound survey for ascites was performed in all four abdominal quadrants. COMPARISON:  CT scan of the abdomen and pelvis 03/05/2017 FINDINGS: Sonographic interrogation of the 4 quadrants of the abdomen demonstrates trace ascites. There is insufficient fluid for paracentesis. IMPRESSION: 1. Trace ascites. 2. Paracentesis was deferred. Electronically Signed   By: Jacqulynn Cadet M.D.   On: 03/18/2017 11:29    Anti-infectives: Anti-infectives  (From admission, onward)   None      Assessment/Plan: Anasarca secondary to malnutrition and hypoalbuminemia T1 Diabetes Mellitus, uncontrolled  Crohn's disease R Perirectal abscess - s/p I&D 03/01/17 Dr. Lucia Gaskins - WBC 5.7, afebrile - Wound improved with more frequent wet to dry dressing changes and sitz baths- continue as you are doing - sitz baths prn - dressing will also need to be changed any time it is soiled - we will sign off. Please call with questions or concerns any time if we can help. Have rescheduled patient's follow up appointment with Dr. Lucia Gaskins for 04/08/17  FEN: HH/CM VTE: SCDs, lovenox ID: No current abx    LOS: 3 days    Clovis Riley 03/19/2017

## 2017-03-20 LAB — CBC
HCT: 26.3 % — ABNORMAL LOW (ref 36.0–46.0)
Hemoglobin: 8.3 g/dL — ABNORMAL LOW (ref 12.0–15.0)
MCH: 30.2 pg (ref 26.0–34.0)
MCHC: 31.6 g/dL (ref 30.0–36.0)
MCV: 95.6 fL (ref 78.0–100.0)
Platelets: 330 10*3/uL (ref 150–400)
RBC: 2.75 MIL/uL — ABNORMAL LOW (ref 3.87–5.11)
RDW: 13.9 % (ref 11.5–15.5)
WBC: 6.2 10*3/uL (ref 4.0–10.5)

## 2017-03-20 LAB — BASIC METABOLIC PANEL
Anion gap: 5 (ref 5–15)
BUN: 12 mg/dL (ref 6–20)
CO2: 34 mmol/L — ABNORMAL HIGH (ref 22–32)
Calcium: 8.3 mg/dL — ABNORMAL LOW (ref 8.9–10.3)
Chloride: 101 mmol/L (ref 101–111)
Creatinine, Ser: 0.37 mg/dL — ABNORMAL LOW (ref 0.44–1.00)
GFR calc Af Amer: >60 mL/min (ref >60)
GFR calc non Af Amer: >60 mL/min (ref >60)
Glucose, Bld: 192 mg/dL — ABNORMAL HIGH (ref 65–99)
Potassium: 4.4 mmol/L (ref 3.5–5.1)
Sodium: 140 mmol/L (ref 135–145)

## 2017-03-20 LAB — GLUCOSE, CAPILLARY
Glucose-Capillary: 183 mg/dL — ABNORMAL HIGH (ref 65–99)
Glucose-Capillary: 139 mg/dL — ABNORMAL HIGH (ref 65–99)
Glucose-Capillary: 104 mg/dL — ABNORMAL HIGH (ref 65–99)
Glucose-Capillary: 241 mg/dL — ABNORMAL HIGH (ref 65–99)

## 2017-03-20 NOTE — Progress Notes (Signed)
[facility]  PROGRESS NOTE  [name] [facility]:[id number] DOB: [birth year] DOA: 03/16/2017 PCP: [name], MD   Brief HPI:    26 y.o. female with a history of recently diagnosed Crohn's disease, T1DM who presented to the ED with swelling of her entire body, having gained about 65 pounds in the previous 3 weeks. She had recently been discharged after an admission for DKA and perirectal abscess that required operative debridement. Her per oral intake has been diminished and she has had 3+ loose stools per day which is about baseline. On exam she exhibited anasarca, CXR demonstrating interstitial edema and pleural effusion. Albumin 1.8, hgb 8.8, WBC normal at 5.6. Urinalysis with no proteinuria.     Subjective   Patient still diuresing very well with IV Lasix.   Assessment/Plan:     1. Anasarca- secondary to protein calorie malnutrition/hyperlipidemia due to malabsorption from Crohn's disease. Patient had lower extremity Dopplers done recently which was negative for DVT. No ascites seen on abdominal ultrasound.  Patient states has improved with IV Lasix. Continue IV Lasix 40 mg  every 12 hours. Will continue with IV Lasix for 1 more day, follow BMP and EM. Consider switching to by mouth Lasix tomorrow. 2. Perirectal abscess- status post debridement on 03/01/17. Completed antibiotics on 03/13/2017. General surgery has signed off  Pain controlled with morphine, Percocet. General surgery follow-up as outpatient on 04/08/2017 3. Crohn's disease-diagnosed on flexible sigmoidoscopy on 03/10/2017. The biopsy showing benign mucosa without inflammation or dysplasia. Continue cholestyramine, Lomotil, Imodium, Creon. Recently checked for C. difficile was positive antigen and  negative for toxin. 4. Anemia-chronic, required blood transfusion during previous hospitalization. Anemia panel showed sufficient B12, low folate, and no iron deficiency (on iron supplementation). Continue iron, folate  supplements and multivitamin. 5. Type 1 diabetes mellitus- hemoglobin A1c 12.2, continue Lantus and sliding scale insulin. Blood glucose is well controlled.    DVT prophylaxis: Lovenox  Code Status: Full code  Family Communication: No family at bedside.   Disposition Plan: Likely home in a.m.   Consultants:  General surgery  Procedures:  Echocardiogram  Continuous infusions     Antibiotics:   Anti-infectives (From admission, onward)   None       Objective   Vitals:   03/19/17 1408 03/19/17 2015 03/20/17 0333 03/20/17 0500  BP: 127/87 128/85 118/74   Pulse: (!) 103 95 90   Resp: 18 19 18    Temp: 98 F (36.7 C) 98.4 F (36.9 C) 98.2 F (36.8 C)   TempSrc: Oral Oral Oral   SpO2: 97% 95% 97%   Weight:    75.1 kg (165 lb 9.6 oz)  Height:        Intake/Output Summary (Last 24 hours) at 03/20/2017 1259 Last data filed at 03/20/2017 1200 Gross per 24 hour  Intake 1560 ml  Output 250 ml  Net 1310 ml   Filed Weights   03/17/17 0620 03/18/17 0608 03/20/17 0500  Weight: 79.2 kg (174 lb 8 oz) 77.1 kg (169 lb 15.6 oz) 75.1 kg (165 lb 9.6 oz)     Physical Examination:   Physical Exam: Eyes: No icterus, extraocular muscles intact  Mouth: Oral mucosa is moist, no lesions on palate,  Neck: Supple, no deformities, masses, or tenderness Lungs: Normal respiratory effort, bilateral clear to auscultation, no crackles or wheezes.  Heart: Regular rate and rhythm, S1 and S2 normal, no murmurs, rubs auscultated Abdomen: BS normoactive,soft,nondistended,non-tender to palpation,no organomegaly Extremities: 3+ edema in the right lower extremity, 1+ edema in  other extremities Neuro : Alert and oriented to time, place and person, No focal deficits Skin: No rashes seen on exam   Data Reviewed: I have personally reviewed following labs and imaging studies  CBG: Recent Labs  Lab 03/19/17 1139 03/19/17 1653 03/19/17 2107 03/20/17 0740 03/20/17 1155  GLUCAP 109*  162* 155* 183* 139*    CBC: Recent Labs  Lab 03/15/17 1419 03/16/17 2225 03/17/17 0411 03/18/17 0555 03/20/17 0421  WBC 5.0 5.6 5.6 5.7 6.2  NEUTROABS 3.7 2.8  --   --   --   HGB 9.4* 8.8* 8.7* 9.2* 8.3*  HCT 29.3* 27.6* 27.7* 30.0* 26.3*  MCV 95.1 95.2 95.8 95.8 95.6  PLT 409* 417* 409* 411* 062    Basic Metabolic Panel: Recent Labs  Lab 03/16/17 2225 03/17/17 0411 03/18/17 0555 03/19/17 0401 03/20/17 0421  NA 143 144 138 139 140  K 3.5 3.4* 4.0 4.1 4.4  CL 108 107 98* 102 101  CO2 28 30 31  34* 34*  GLUCOSE 79 113* 270* 177* 192*  BUN 8 8 8 10 12   CREATININE 0.33* 0.34* 0.46 0.43* 0.37*  CALCIUM 8.2* 8.2* 8.2* 8.0* 8.3*  MG  --   --   --  1.9  --     No results found for this or any previous visit (from the past 240 hour(s)).   Liver Function Tests: Recent Labs  Lab 03/15/17 1419 03/16/17 2225 03/17/17 0411  AST 274* 40 44*  ALT 62* 33 31  ALKPHOS 230* 156* 146*  BILITOT 0.7 0.2* 0.2*  PROT 7.3 7.0 6.8  ALBUMIN 1.8* 1.8* 1.8*   No results for input(s): LIPASE, AMYLASE in the last 168 hours. No results for input(s): AMMONIA in the last 168 hours.  Cardiac Enzymes: No results for input(s): CKTOTAL, CKMB, CKMBINDEX, TROPONINI in the last 168 hours. BNP (last 3 results) No results for input(s): BNP in the last 8760 hours.  ProBNP (last 3 results) No results for input(s): PROBNP in the last 8760 hours.    Studies: No results found.  Scheduled Meds: . cholestyramine light  4 g Oral TID  . citalopram  20 mg Oral Daily  . enoxaparin (LOVENOX) injection  40 mg Subcutaneous QHS  . feeding supplement (GLUCERNA SHAKE)  237 mL Oral TID BM  . ferrous sulfate  325 mg Oral BID WC  . folic acid  1 mg Oral Daily  . furosemide  40 mg Intravenous Q12H  . gabapentin  300 mg Oral BID  . insulin aspart  0-9 Units Subcutaneous TID WC  . insulin glargine  10 Units Subcutaneous Daily  . lipase/protease/amylase  36,000 Units Oral TID WC  . loperamide  2 mg Oral  TID AC  . multivitamin with minerals  1 tablet Oral Daily      Time spent: 20 min  [facility] Pager [phone]. If 7PM-7AM, please contact night-coverage at [email or web address], Office  [phone]  password [email or web address]  03/20/2017, 12:59 PM  LOS: 4 days

## 2017-03-21 DIAGNOSIS — R188 Other ascites: Secondary | ICD-10-CM

## 2017-03-21 LAB — BASIC METABOLIC PANEL
Anion gap: 6 (ref 5–15)
BUN: 13 mg/dL (ref 6–20)
CO2: 34 mmol/L — ABNORMAL HIGH (ref 22–32)
Calcium: 8.5 mg/dL — ABNORMAL LOW (ref 8.9–10.3)
Chloride: 96 mmol/L — ABNORMAL LOW (ref 101–111)
Creatinine, Ser: 0.41 mg/dL — ABNORMAL LOW (ref 0.44–1.00)
GFR calc Af Amer: >60 mL/min (ref >60)
GFR calc non Af Amer: >60 mL/min (ref >60)
Glucose, Bld: 170 mg/dL — ABNORMAL HIGH (ref 65–99)
Potassium: 4.5 mmol/L (ref 3.5–5.1)
Sodium: 136 mmol/L (ref 135–145)

## 2017-03-21 LAB — GLUCOSE, CAPILLARY
Glucose-Capillary: 105 mg/dL — ABNORMAL HIGH (ref 65–99)
Glucose-Capillary: 39 mg/dL — CL (ref 65–99)
Glucose-Capillary: 59 mg/dL — ABNORMAL LOW (ref 65–99)
Glucose-Capillary: 109 mg/dL — ABNORMAL HIGH (ref 65–99)
Glucose-Capillary: 21 mg/dL — CL (ref 65–99)
Glucose-Capillary: 113 mg/dL — ABNORMAL HIGH (ref 65–99)
Glucose-Capillary: 98 mg/dL (ref 65–99)
Glucose-Capillary: 124 mg/dL — ABNORMAL HIGH (ref 65–99)
Glucose-Capillary: 365 mg/dL — ABNORMAL HIGH (ref 65–99)

## 2017-03-21 LAB — CBC
HCT: 26.6 % — ABNORMAL LOW (ref 36.0–46.0)
Hemoglobin: 8.3 g/dL — ABNORMAL LOW (ref 12.0–15.0)
MCH: 29.6 pg (ref 26.0–34.0)
MCHC: 31.2 g/dL (ref 30.0–36.0)
MCV: 95 fL (ref 78.0–100.0)
Platelets: 327 10*3/uL (ref 150–400)
RBC: 2.8 MIL/uL — ABNORMAL LOW (ref 3.87–5.11)
RDW: 13.3 % (ref 11.5–15.5)
WBC: 5.4 10*3/uL (ref 4.0–10.5)

## 2017-03-21 MED ORDER — OXYCODONE-ACETAMINOPHEN 5-325 MG PO TABS: 1.0000 | ORAL_TABLET | Freq: Four times a day (QID) | ORAL | 0 refills | Status: DC | PRN

## 2017-03-21 MED ORDER — DEXTROSE 50 % IV SOLN
INTRAVENOUS | Status: AC
  Administered 2017-03-21: 11:00:00
  Filled 2017-03-21: qty 50

## 2017-03-21 MED ORDER — DEXTROSE 50 % IV SOLN
INTRAVENOUS | Status: AC
  Administered 2017-03-21: 09:00:00
  Filled 2017-03-21: qty 50

## 2017-03-21 MED ORDER — EPINEPHRINE 0.3 MG/0.3ML IJ SOAJ: 0.3000 mg | INTRAMUSCULAR | Status: DC | PRN

## 2017-03-21 MED ORDER — FUROSEMIDE 40 MG PO TABS: 40.0000 mg | ORAL_TABLET | Freq: Every day | ORAL | 0 refills | Status: DC

## 2017-03-21 NOTE — Progress Notes (Signed)
CRITICAL VALUE ALERT  Critical Value:  CBG 39 @ 0846, rechecked 0907 CBG 59, reckecked @0920  CBG 82.  Date & Time Notied:  03/21/17 @ 0846  Provider Notified: Hanley Ben  Orders Received/Actions taken: half amp of D50 given and pt drinking OJ and eating peanut butter.

## 2017-03-21 NOTE — Progress Notes (Signed)
Went over and educate patient with Discharge instructions, husband at bedside.  Patient understands and questions answered. Patient belongings taken home with patient via wheelchair.

## 2017-03-21 NOTE — Progress Notes (Signed)
CRITICAL VALUE ALERT  Critical Value: @ 1007 CBG 21, @1029  CBG 109, @1054  CBG 98   Date & Time Notied:  03/21/17 @ 1007   Provider Notified: Hanley Ben  Orders Received/Actions taken: gave half amp of D50. Frequent CBG checks, monitor symptoms, encourage carb intake. Pt currently drinking OJ

## 2017-03-21 NOTE — Discharge Summary (Addendum)
Physician Discharge Summary  [name] IRS:[id number] DOB: Dec 29, 1990 DOA: 03/16/2017  PCP: [name], MD  Admit date: 03/16/2017 Discharge date: 03/21/2017  Admitted From: Home Disposition: Home  Recommendations for Outpatient Follow-up:  1. Follow up with PCP in 1 week with repeat CBC/BMP 2. Follow-up with general surgery as scheduled 3. Follow-up with GI/Dr. Benson [name] in 1-2 weeks   Home Health: No Equipment/Devices: None  Discharge Condition: Stable CODE STATUS: Full  diet recommendation:  Carb Modified   Brief/Interim Summary: 26 year old female with history of chronic diarrhea, type 1 diabetes mellitus, perirectal abscess requiring recent operative debridement is presented with swelling of her entire body and weight gain.  She was admitted with anasarca with very low albumin.  There was no significant ascites so paracentesis could not be done.  She was started on intravenous Lasix.  Patient has persistent diarrhea which is improving.  She will be switched to oral Lasix and discharged home today.  Discharge Diagnoses:  Principal Problem:   Anasarca Active Problems:   Diabetes type 1, uncontrolled (HCC)   Protein-calorie malnutrition, severe (HCC)   Malnutrition of moderate degree   1. Anasarca- secondary to protein calorie malnutrition due to malabsorption from chronic diarrhea. Patient had lower extremity Dopplers done recently which was negative for DVT. No ascites seen on abdominal ultrasound.  Echo showed EF of 60-65%.  Currently on intravenous Lasix.  Switch to Lasix 40 mg orally daily.  Outpatient follow-up.  Symptoms improving. 2. Perirectal abscess- status post debridement on 03/01/17. Completed antibiotics on 03/13/2017. General surgery following.  Outpatient follow-up with general surgery  3. Chronic diarrhea-recent flexible sigmoidoscopy on 03/10/2017. The biopsy showing benign mucosa without inflammation or dysplasia. Continue cholestyramine, Lomotil,  Imodium, Creon. Recently checked for C. difficile was positive antigen and  negative for toxin.  Outpatient follow-up with GI. 4. Anemia-chronic, required blood transfusion during previous hospitalization.Anemia panel showed sufficient B12, low folate, and no iron deficiency (on iron supplementation). Continue iron, folate supplements.  Outpatient follow-up  5.   type 1 diabetes mellitus with hypoglycemia- hold Lantus until evaluation by primary care provider.  Outpatient follow-up.  Carb modified diet   Discharge Instructions  Discharge Instructions    Call MD for:  difficulty breathing, headache or visual disturbances   Complete by:  As directed    Call MD for:  extreme fatigue   Complete by:  As directed    Call MD for:  hives   Complete by:  As directed    Call MD for:  persistant dizziness or light-headedness   Complete by:  As directed    Call MD for:  persistant nausea and vomiting   Complete by:  As directed    Call MD for:  severe uncontrolled pain   Complete by:  As directed    Call MD for:  temperature >100.4   Complete by:  As directed    Diet - low sodium heart healthy   Complete by:  As directed    Diet Carb Modified   Complete by:  As directed    Increase activity slowly   Complete by:  As directed      Allergies as of 03/21/2017      Reactions   Peanut-containing Drug Products Anaphylaxis, Other (See Comments)   Pt states that she is allergic to [place] nuts.       Lactose Intolerance (gi) Other (See Comments)   Gi upset       Medication List    STOP taking these  medications   insulin glargine 100 UNIT/ML injection Commonly known as:  LANTUS     TAKE these medications   albuterol 108 (90 Base) MCG/ACT inhaler Commonly known as:  PROVENTIL HFA;VENTOLIN HFA Inhale 1-2 puffs into the lungs every 4 (four) hours as needed for wheezing or shortness of breath.   cholestyramine light 4 g packet Commonly known as:  PREVALITE Take 1 packet (4 g total) by  mouth 3 (three) times daily.   citalopram 20 MG tablet Commonly known as:  CELEXA Take 1 tablet (20 mg total) by mouth daily.   diphenoxylate-atropine 2.5-0.025 MG tablet Commonly known as:  LOMOTIL Take 2 tablets by mouth 4 (four) times daily.   EPINEPHrine 0.3 mg/0.3 mL Soaj injection Commonly known as:  EPIPEN 2-PAK Inject 0.3 mLs (0.3 mg total) into the muscle as needed (allergic reaction).   feeding supplement (GLUCERNA SHAKE) Liqd Take 237 mLs by mouth 2 (two) times daily between meals.   ferrous sulfate 325 (65 FE) MG tablet Take 1 tablet (325 mg total) by mouth 2 (two) times daily with a meal.   folic acid 1 MG tablet Commonly known as:  FOLVITE Take 1 tablet (1 mg total) by mouth daily.   furosemide 40 MG tablet Commonly known as:  LASIX Take 1 tablet (40 mg total) by mouth daily.   gabapentin 300 MG capsule Commonly known as:  NEURONTIN Take 1 capsule (300 mg total) by mouth 2 (two) times daily.   glucose 4 GM chewable tablet Chew 1 tablet (4 g total) by mouth as needed for low blood sugar.   insulin lispro 100 UNIT/ML injection Commonly known as:  HUMALOG BG 70 - 120: 0 units BG 121 - 150: 1 unit BG 151 - 200: 2 units BG 201 - 250: 3 units BG 251 - 300: 5 units BG 301 - 350: 7 units BG 351 - 400: 9 units   lipase/protease/amylase 36000 UNITS Cpep capsule Commonly known as:  CREON Take 1 capsule (36,000 Units total) by mouth 3 (three) times daily with meals.   loperamide 2 MG capsule Commonly known as:  IMODIUM Take 1 capsule (2 mg total) by mouth 3 (three) times daily before meals. DO NOT TAKE IT FOR THE DAY YOU DONT HAVE A BOWEL MOVEMENT.   methocarbamol 500 MG tablet Commonly known as:  ROBAXIN Take 1 tablet (500 mg total) by mouth every 8 (eight) hours as needed for muscle spasms.   MIDOL 200 MG Caps Generic drug:  Ibuprofen Take 400 mg by mouth daily as needed (pain).   oxyCODONE-acetaminophen 5-325 MG tablet Commonly known as:   PERCOCET/ROXICET Take 1 tablet by mouth every 6 (six) hours as needed for severe pain.   traMADol 50 MG tablet Commonly known as:  ULTRAM Take 1 tablet (50 mg total) by mouth every 6 (six) hours as needed for moderate pain.       Follow-up Information    Alphonsa Overall, MD. Go on 04/08/2017.   Specialty:  General Surgery Why:  Your appointment is at 11:30 AM. Please arrive 30 min prior to appointment time. Bring photo ID and insurance information.  Contact information: [place] [address] [phone]        Carol Ada, MD. Schedule an appointment as soon as possible for a visit in 1 week(s).   Specialty:  Gastroenterology Contact information: [address] [place] [address] [phone]        [name], MD. Schedule an appointment  as soon as possible for a visit in 1 week(s).   Specialty:  Internal Medicine Contact information: [address] [place] [address] [phone]          Allergies  Allergen Reactions  . Peanut-Containing Drug Products Anaphylaxis and Other (See Comments)    Pt states that she is allergic to [place] nuts.      . Lactose Intolerance (Gi) Other (See Comments)    Gi upset     Consultations:  General surgery   Procedures/Studies: Ct Abdomen Pelvis W Contrast  Result Date: 03/15/2017 CLINICAL DATA:  Vomiting, diarrhea, weakness. Recent hospitalization with DKA and buttock abscess treatment. EXAM: CT ABDOMEN AND PELVIS WITH CONTRAST TECHNIQUE: Multidetector CT imaging of the abdomen and pelvis was performed using the standard protocol following bolus administration of intravenous contrast. CONTRAST:  176m ISOVUE-300 IOPAMIDOL (ISOVUE-300) INJECTION 61% COMPARISON:  02/28/2017 FINDINGS: Lower chest: Bilateral pleural effusions, slightly enlarged from before. Bibasilar subsegmental atelectasis. Hepatobiliary: No focal liver abnormality is seen. Status post  cholecystectomy. No biliary dilatation. Pancreas: Atrophic appearance. Spleen: Normal in size without focal abnormality. Adrenals/Urinary Tract: Adrenal glands are unremarkable. Kidneys are normal, without renal calculi, focal lesion, or hydronephrosis. Bladder is unremarkable. Stomach/Bowel: Stomach is within normal limits. No evidence of appendicitis. No evidence of bowel wall thickening, distention, or inflammatory changes. Vascular/Lymphatic: No significant vascular findings are present. No enlarged abdominal or pelvic lymph nodes. Reproductive: Uterus and bilateral adnexa are unremarkable. Other: Diffuse abdominal wall edema. Improved appearance of perirectal and right buttock abscess. Small amount of phlegmonous material and gas remain in the right buttock and intragluteal fold. No drainable collections seen. Musculoskeletal: No acute or significant osseous findings. IMPRESSION: Increased bilateral pleural effusions with associated subsegmental atelectasis. Persistent moderate abdominopelvic ascites. Diffuse abdominal wall edema. Improved appearance of perirectal and right buttock abscess. Electronically Signed   By: DFidela SalisburyM.D.   On: 03/15/2017 16:38   Ct Abdomen Pelvis W Contrast  Result Date: 02/28/2017 CLINICAL DATA:  Chronic diarrhea.  Weight loss. EXAM: CT ABDOMEN AND PELVIS WITH CONTRAST TECHNIQUE: Multidetector CT imaging of the abdomen and pelvis was performed using the standard protocol following bolus administration of intravenous contrast. CONTRAST:  <See Chart> ISOVUE-300 IOPAMIDOL (ISOVUE-300) INJECTION 61% COMPARISON:  01/15/2016 FINDINGS: Lower chest: Small bilateral pleural effusions are associated with by basilar collapse/ consolidation. Hepatobiliary: No focal abnormality within the liver parenchyma. Mild periportal edema noted. Gallbladder surgically absent. No intrahepatic or extrahepatic biliary dilation. Pancreas: No focal mass lesion. No dilatation of the main duct. No  intraparenchymal cyst. No peripancreatic edema. Spleen: No splenomegaly. No focal mass lesion. Adrenals/Urinary Tract: No adrenal nodule or mass. Kidneys are unremarkable. No evidence for hydroureter. Urinary bladder is decompressed by Foley catheter but bladder wall appears thick despite the decompressed state. Stomach/Bowel: Stomach is nondistended. No gastric wall thickening. No evidence of outlet obstruction. Duodenum is normally positioned as is the ligament of Treitz. No small bowel wall thickening. No small bowel dilatation. The terminal ileum is normal. The appendix is not visualized, but there is no edema or inflammation in the region of the cecum. No gross colonic mass. No colonic wall thickening. No substantial diverticular change. Vascular/Lymphatic: No abdominal aortic aneurysm. No abdominal aortic atherosclerotic calcification. Small gastrohepatic ligament lymph node without other lymphadenopathy in the abdomen. No pelvic sidewall lymphadenopathy. Reproductive: Uterus unremarkable.  There is no adnexal mass. Other: Moderate volume ascites is associated with diffuse mesenteric retroperitoneal and body wall edema. Musculoskeletal: Complex perirectal abscess tracks posteriorly into the subcutaneous  fat in then up behind the sacrum. The lesion tracks caudally in the subcutaneous fat deep to the right intergluteal fold towards the perineum but has been incompletely visualized in its lower extent. IMPRESSION: 1. Complex perirectal fluid collection tracks up behind the sacrum and down along the right intergluteal fold towards the perineum although inferior extent of the abscess has been incompletely visualized. 2. Moderate volume ascites with diffuse soft tissue and body wall edema. 3. Bilateral lower lung collapse/consolidation with small bilateral pleural effusions. Electronically Signed   By: Misty Stanley M.D.   On: 02/28/2017 13:56   US Abdomen Limited  Result Date: 03/18/2017 CLINICAL DATA:   26 year old female with anasarca and possible ascites. EXAM: LIMITED ABDOMEN ULTRASOUND FOR ASCITES TECHNIQUE: Limited ultrasound survey for ascites was performed in all four abdominal quadrants. COMPARISON:  CT scan of the abdomen and pelvis 03/05/2017 FINDINGS: Sonographic interrogation of the 4 quadrants of the abdomen demonstrates trace ascites. There is insufficient fluid for paracentesis. IMPRESSION: 1. Trace ascites. 2. Paracentesis was deferred. Electronically Signed   By: Jacqulynn Cadet M.D.   On: 03/18/2017 11:29   US Paracentesis  Result Date: 03/12/2017 INDICATION: Patient with history of diarrhea, low albumin/malnutrition, diabetes, ascites, perirectal fluid collection; request made for diagnostic and therapeutic paracentesis. EXAM: ULTRASOUND GUIDED DIAGNOSTIC PARACENTESIS MEDICATIONS: None. COMPLICATIONS: None immediate. PROCEDURE: Informed written consent was obtained from the patient after a discussion of the risks, benefits and alternatives to treatment. A timeout was performed prior to the initiation of the procedure. Initial ultrasound scanning demonstrates a small amount of ascites within the right mid to lower abdominal quadrant. The right mid to lower abdomen was prepped and draped in the usual sterile fashion. 2% lidocaine was used for local anesthesia. Following this, a Yueh catheter was introduced. An ultrasound image was saved for documentation purposes. The paracentesis was performed. The catheter was removed and a dressing was applied. The patient tolerated the procedure well without immediate post procedural complication. FINDINGS: A total of approximately 65 cc of clear, colorless fluid was removed. Samples were sent to the laboratory as requested by the clinical team. IMPRESSION: Successful ultrasound-guided diagnostic paracentesis yielding 65 cc of peritoneal fluid. Read by: Rowe Robert, PA-C Electronically Signed   By: Jerilynn Mages.  Shick M.D.   On: 03/11/2017 17:51   Dg Chest Port  1 View  Result Date: 03/17/2017 CLINICAL DATA:  Initial evaluation for acute shortness of breath, anasarca. EXAM: PORTABLE CHEST 1 VIEW COMPARISON:  Prior radiograph from 02/26/2017. FINDINGS: Patient is rotated to the right. Allowing for rotation, cardiac and mediastinal silhouettes are stable in size and contour, and remain within normal limits. Lungs hypoinflated. Diffuse vascular and interstitial prominence suggesting diffuse pulmonary interstitial edema. Layering right pleural effusion. Associated right basilar opacity favored to reflect edema and/or effusion with atelectasis. Infiltrate could be considered in the correct clinical setting. No pneumothorax. Osseous structures within normal limits. IMPRESSION: 1. Findings suggestive of diffuse pulmonary interstitial edema. 2. Small to moderate layering right pleural effusion. Associated right basilar opacity favored to reflect edema and/or atelectasis. Infiltrate could be considered in the correct clinical setting. Electronically Signed   By: Jeannine Boga M.D.   On: 03/17/2017 02:28   Dg Chest Port 1 View  Result Date: 02/26/2017 CLINICAL DATA:  26 year old female with weakness and fatigue. EXAM: PORTABLE CHEST 1 VIEW COMPARISON:  12/14/2016 FINDINGS: The heart size and mediastinal contours are within normal limits. No focal parenchymal opacity or sizable pleural effusion. Small pneumothorax difficult to definitively exclude on supine  only radiographs. The visualized skeletal structures are unremarkable. IMPRESSION: No active disease. Electronically Signed   By: Kristopher Oppenheim M.D.   On: 02/26/2017 17:30   Echo on 03/17/2017 Study Conclusions  - Left ventricle: The cavity size was normal. There is mild LVH.   Systolic function was normal. The estimated ejection fraction was   in the range of 60% to 65%. Wall motion was normal; there were no   regional wall motion abnormalities. Left ventricular diastolic   function parameters were  normal. - Mitral valve: Mildly thickened leaflets . There was mild   regurgitation. - Left atrium: The atrium was mildly dilated. - Right atrium: The atrium was normal in size. - Tricuspid valve: There was mild regurgitation. - Pulmonary arteries: PA peak pressure: 29 mm Hg (S). - Inferior vena cava: The vessel was normal in size. The   respirophasic diameter changes were in the normal range (>= 50%),   consistent with normal central venous pressure.  Impressions:  - Compared to a prior study in 2015, there is now mild LVH.  Subjective: Patient seen and examined at bedside.  She states that her diarrhea is improving.  No overnight fever or vomiting.  Discharge Exam: Vitals:   03/20/17 2019 03/21/17 0527  BP: 130/88 112/75  Pulse: 97 93  Resp: 15 14  Temp: 98.1 F (36.7 C) 98.5 F (36.9 C)  SpO2: 95% 95%   Vitals:   03/20/17 1500 03/20/17 2019 03/21/17 0344 03/21/17 0527  BP: 110/70 130/88  112/75  Pulse: 98 97  93  Resp: 16 15  14   Temp: 98.3 F (36.8 C) 98.1 F (36.7 C)  98.5 F (36.9 C)  TempSrc: Oral Oral  Oral  SpO2: 96% 95%  95%  Weight:   73.6 kg (162 lb 4.1 oz)   Height:        General: Pt is alert, awake, not in acute distress Cardiovascular: Rate controlled, S1/S2 + Respiratory: Bilateral decreased breath sounds at bases  abdominal: Soft, NT, ND, bowel sounds + Extremities: 1-2+ edema, no cyanosis    The results of significant diagnostics from this hospitalization (including imaging, microbiology, ancillary and laboratory) are listed below for reference.     Microbiology: No results found for this or any previous visit (from the past 240 hour(s)).   Labs: BNP (last 3 results) No results for input(s): BNP in the last 8760 hours. Basic Metabolic Panel: Recent Labs  Lab 03/17/17 0411 03/18/17 0555 03/19/17 0401 03/20/17 0421 03/21/17 0510  NA 144 138 139 140 136  K 3.4* 4.0 4.1 4.4 4.5  CL 107 98* 102 101 96*  CO2 30 31 34* 34* 34*   GLUCOSE 113* 270* 177* 192* 170*  BUN 8 8 10 12 13   CREATININE 0.34* 0.46 0.43* 0.37* 0.41*  CALCIUM 8.2* 8.2* 8.0* 8.3* 8.5*  MG  --   --  1.9  --   --    Liver Function Tests: Recent Labs  Lab 03/15/17 1419 03/16/17 2225 03/17/17 0411  AST 274* 40 44*  ALT 62* 33 31  ALKPHOS 230* 156* 146*  BILITOT 0.7 0.2* 0.2*  PROT 7.3 7.0 6.8  ALBUMIN 1.8* 1.8* 1.8*   No results for input(s): LIPASE, AMYLASE in the last 168 hours. No results for input(s): AMMONIA in the last 168 hours. CBC: Recent Labs  Lab 03/15/17 1419 03/16/17 2225 03/17/17 0411 03/18/17 0555 03/20/17 0421 03/21/17 0510  WBC 5.0 5.6 5.6 5.7 6.2 5.4  NEUTROABS 3.7 2.8  --   --   --   --  HGB 9.4* 8.8* 8.7* 9.2* 8.3* 8.3*  HCT 29.3* 27.6* 27.7* 30.0* 26.3* 26.6*  MCV 95.1 95.2 95.8 95.8 95.6 95.0  PLT 409* 417* 409* 411* 330 327   Cardiac Enzymes: No results for input(s): CKTOTAL, CKMB, CKMBINDEX, TROPONINI in the last 168 hours. BNP: Invalid input(s): POCBNP CBG: Recent Labs  Lab 03/21/17 1007 03/21/17 1029 03/21/17 1050 03/21/17 1111 03/21/17 1242  GLUCAP 21* 109* 98 113* 124*   D-Dimer No results for input(s): DDIMER in the last 72 hours. Hgb A1c No results for input(s): HGBA1C in the last 72 hours. Lipid Profile No results for input(s): CHOL, HDL, LDLCALC, TRIG, CHOLHDL, LDLDIRECT in the last 72 hours. Thyroid function studies No results for input(s): TSH, T4TOTAL, T3FREE, THYROIDAB in the last 72 hours.  Invalid input(s): FREET3 Anemia work up No results for input(s): VITAMINB12, FOLATE, FERRITIN, TIBC, IRON, RETICCTPCT in the last 72 hours. Urinalysis    Component Value Date/Time   COLORURINE YELLOW 03/16/2017 2302   APPEARANCEUR CLEAR 03/16/2017 2302   LABSPEC 1.018 03/16/2017 2302   PHURINE 6.0 03/16/2017 2302   GLUCOSEU NEGATIVE 03/16/2017 2302   HGBUR SMALL (A) 03/16/2017 2302   BILIRUBINUR NEGATIVE 03/16/2017 2302   BILIRUBINUR negative 01/02/2015 1717   KETONESUR NEGATIVE  03/16/2017 2302   PROTEINUR NEGATIVE 03/16/2017 2302   UROBILINOGEN 0.2 01/13/2015 0223   NITRITE NEGATIVE 03/16/2017 2302   LEUKOCYTESUR SMALL (A) 03/16/2017 2302   Sepsis Labs Invalid input(s): PROCALCITONIN,  WBC,  LACTICIDVEN Microbiology No results found for this or any previous visit (from the past 240 hour(s)).   Time coordinating discharge: 35 minutes  SIGNED:   Aline August, MD  [facility] 03/21/2017, 1:35 PM Pager: [phone]  If 7PM-7AM, please contact night-coverage [email or web address] Password [email or web address]

## 2017-03-23 ENCOUNTER — Observation Stay (HOSPITAL_COMMUNITY): Payer: 59 | Admitting: Anesthesiology

## 2017-03-23 ENCOUNTER — Emergency Department (HOSPITAL_COMMUNITY): Payer: 59

## 2017-03-23 ENCOUNTER — Encounter (HOSPITAL_COMMUNITY)
Admission: AD | Disposition: A | Discharge status: Home / Self Care | Payer: Self-pay | Source: Home / Self Care | Attending: Family Medicine

## 2017-03-23 ENCOUNTER — Other Ambulatory Visit: Payer: Self-pay

## 2017-03-23 ENCOUNTER — Encounter (HOSPITAL_COMMUNITY): Payer: Self-pay | Admitting: Emergency Medicine

## 2017-03-23 ENCOUNTER — Inpatient Hospital Stay (HOSPITAL_COMMUNITY)
Admission: AD | Admit: 2017-03-23 | Discharge: 2017-03-24 | DRG: 863 | Disposition: A | Discharge status: Home / Self Care | Payer: 59 | Attending: Internal Medicine | Admitting: Internal Medicine

## 2017-03-23 DIAGNOSIS — E10628 Type 1 diabetes mellitus with other skin complications: Secondary | ICD-10-CM | POA: Diagnosis present

## 2017-03-23 DIAGNOSIS — E1159 Type 2 diabetes mellitus with other circulatory complications: Secondary | ICD-10-CM | POA: Diagnosis not present

## 2017-03-23 DIAGNOSIS — E119 Type 2 diabetes mellitus without complications: Secondary | ICD-10-CM

## 2017-03-23 DIAGNOSIS — R102 Pelvic and perineal pain: Secondary | ICD-10-CM | POA: Diagnosis not present

## 2017-03-23 DIAGNOSIS — T8141XA Infection following a procedure, superficial incisional surgical site, initial encounter: Principal | ICD-10-CM | POA: Diagnosis present

## 2017-03-23 DIAGNOSIS — E1065 Type 1 diabetes mellitus with hyperglycemia: Secondary | ICD-10-CM | POA: Diagnosis present

## 2017-03-23 DIAGNOSIS — Z9049 Acquired absence of other specified parts of digestive tract: Secondary | ICD-10-CM

## 2017-03-23 DIAGNOSIS — I10 Essential (primary) hypertension: Secondary | ICD-10-CM

## 2017-03-23 DIAGNOSIS — K5090 Crohn's disease, unspecified, without complications: Secondary | ICD-10-CM | POA: Diagnosis present

## 2017-03-23 DIAGNOSIS — K3184 Gastroparesis: Secondary | ICD-10-CM | POA: Diagnosis present

## 2017-03-23 DIAGNOSIS — K603 Anal fistula: Secondary | ICD-10-CM | POA: Diagnosis not present

## 2017-03-23 DIAGNOSIS — Z91199 Patient's noncompliance with other medical treatment and regimen due to unspecified reason: Secondary | ICD-10-CM

## 2017-03-23 DIAGNOSIS — E10649 Type 1 diabetes mellitus with hypoglycemia without coma: Secondary | ICD-10-CM

## 2017-03-23 DIAGNOSIS — Z6825 Body mass index (BMI) 25.0-25.9, adult: Secondary | ICD-10-CM

## 2017-03-23 DIAGNOSIS — L0231 Cutaneous abscess of buttock: Secondary | ICD-10-CM | POA: Diagnosis not present

## 2017-03-23 DIAGNOSIS — F330 Major depressive disorder, recurrent, mild: Secondary | ICD-10-CM | POA: Diagnosis present

## 2017-03-23 DIAGNOSIS — K611 Rectal abscess: Secondary | ICD-10-CM

## 2017-03-23 DIAGNOSIS — E1040 Type 1 diabetes mellitus with diabetic neuropathy, unspecified: Secondary | ICD-10-CM | POA: Diagnosis present

## 2017-03-23 DIAGNOSIS — Y838 Other surgical procedures as the cause of abnormal reaction of the patient, or of later complication, without mention of misadventure at the time of the procedure: Secondary | ICD-10-CM | POA: Diagnosis present

## 2017-03-23 DIAGNOSIS — L03317 Cellulitis of buttock: Secondary | ICD-10-CM | POA: Diagnosis present

## 2017-03-23 DIAGNOSIS — Z9119 Patient's noncompliance with other medical treatment and regimen: Secondary | ICD-10-CM | POA: Diagnosis not present

## 2017-03-23 DIAGNOSIS — E1165 Type 2 diabetes mellitus with hyperglycemia: Secondary | ICD-10-CM | POA: Diagnosis present

## 2017-03-23 DIAGNOSIS — Z9114 Patient's other noncompliance with medication regimen: Secondary | ICD-10-CM

## 2017-03-23 DIAGNOSIS — Z91011 Allergy to milk products: Secondary | ICD-10-CM

## 2017-03-23 DIAGNOSIS — E440 Moderate protein-calorie malnutrition: Secondary | ICD-10-CM | POA: Diagnosis present

## 2017-03-23 DIAGNOSIS — M797 Fibromyalgia: Secondary | ICD-10-CM | POA: Diagnosis present

## 2017-03-23 DIAGNOSIS — IMO0001 Reserved for inherently not codable concepts without codable children: Secondary | ICD-10-CM | POA: Diagnosis present

## 2017-03-23 DIAGNOSIS — Z91010 Allergy to peanuts: Secondary | ICD-10-CM

## 2017-03-23 DIAGNOSIS — Z79899 Other long term (current) drug therapy: Secondary | ICD-10-CM

## 2017-03-23 DIAGNOSIS — E1043 Type 1 diabetes mellitus with diabetic autonomic (poly)neuropathy: Secondary | ICD-10-CM | POA: Diagnosis present

## 2017-03-23 DIAGNOSIS — Z8249 Family history of ischemic heart disease and other diseases of the circulatory system: Secondary | ICD-10-CM

## 2017-03-23 DIAGNOSIS — E1142 Type 2 diabetes mellitus with diabetic polyneuropathy: Secondary | ICD-10-CM | POA: Diagnosis present

## 2017-03-23 DIAGNOSIS — F1120 Opioid dependence, uncomplicated: Secondary | ICD-10-CM | POA: Diagnosis present

## 2017-03-23 DIAGNOSIS — Z833 Family history of diabetes mellitus: Secondary | ICD-10-CM

## 2017-03-23 DIAGNOSIS — E1069 Type 1 diabetes mellitus with other specified complication: Secondary | ICD-10-CM | POA: Diagnosis present

## 2017-03-23 DIAGNOSIS — Z794 Long term (current) use of insulin: Secondary | ICD-10-CM

## 2017-03-23 DIAGNOSIS — R197 Diarrhea, unspecified: Secondary | ICD-10-CM | POA: Diagnosis not present

## 2017-03-23 DIAGNOSIS — F419 Anxiety disorder, unspecified: Secondary | ICD-10-CM | POA: Diagnosis present

## 2017-03-23 DIAGNOSIS — I152 Hypertension secondary to endocrine disorders: Secondary | ICD-10-CM | POA: Diagnosis present

## 2017-03-23 DIAGNOSIS — IMO0002 Reserved for concepts with insufficient information to code with codable children: Secondary | ICD-10-CM | POA: Diagnosis present

## 2017-03-23 HISTORY — PX: IRRIGATION AND DEBRIDEMENT BUTTOCKS: SHX6601

## 2017-03-23 LAB — GLUCOSE, CAPILLARY
Glucose-Capillary: 58 mg/dL — ABNORMAL LOW (ref 65–99)
Glucose-Capillary: 63 mg/dL — ABNORMAL LOW (ref 65–99)
Glucose-Capillary: 78 mg/dL (ref 65–99)
Glucose-Capillary: 132 mg/dL — ABNORMAL HIGH (ref 65–99)
Glucose-Capillary: 56 mg/dL — ABNORMAL LOW (ref 65–99)
Glucose-Capillary: 88 mg/dL (ref 65–99)
Glucose-Capillary: 113 mg/dL — ABNORMAL HIGH (ref 65–99)
Glucose-Capillary: 62 mg/dL — ABNORMAL LOW (ref 65–99)
Glucose-Capillary: 85 mg/dL (ref 65–99)
Glucose-Capillary: 233 mg/dL — ABNORMAL HIGH (ref 65–99)
Glucose-Capillary: 330 mg/dL — ABNORMAL HIGH (ref 65–99)

## 2017-03-23 LAB — C-REACTIVE PROTEIN: CRP: <0.8 mg/dL (ref <1.0)

## 2017-03-23 LAB — CBC WITH DIFFERENTIAL/PLATELET
Basophils Absolute: 0.1 10*3/uL (ref 0.0–0.1)
Basophils Relative: 1 %
Eosinophils Absolute: 0.3 10*3/uL (ref 0.0–0.7)
Eosinophils Relative: 4 %
HCT: 27 % — ABNORMAL LOW (ref 36.0–46.0)
Hemoglobin: 8.6 g/dL — ABNORMAL LOW (ref 12.0–15.0)
Lymphocytes Relative: 25 %
Lymphs Abs: 1.6 10*3/uL (ref 0.7–4.0)
MCH: 30.4 pg (ref 26.0–34.0)
MCHC: 31.9 g/dL (ref 30.0–36.0)
MCV: 95.4 fL (ref 78.0–100.0)
Monocytes Absolute: 0.7 10*3/uL (ref 0.1–1.0)
Monocytes Relative: 10 %
Neutro Abs: 3.9 10*3/uL (ref 1.7–7.7)
Neutrophils Relative %: 60 %
Platelets: 360 10*3/uL (ref 150–400)
RBC: 2.83 MIL/uL — ABNORMAL LOW (ref 3.87–5.11)
RDW: 13.3 % (ref 11.5–15.5)
WBC: 6.5 10*3/uL (ref 4.0–10.5)

## 2017-03-23 LAB — COMPREHENSIVE METABOLIC PANEL
ALT: 20 U/L (ref 14–54)
AST: 51 U/L — ABNORMAL HIGH (ref 15–41)
Albumin: 2.2 g/dL — ABNORMAL LOW (ref 3.5–5.0)
Alkaline Phosphatase: 99 U/L (ref 38–126)
Anion gap: 9 (ref 5–15)
BUN: 12 mg/dL (ref 6–20)
CO2: 28 mmol/L (ref 22–32)
Calcium: 8.4 mg/dL — ABNORMAL LOW (ref 8.9–10.3)
Chloride: 99 mmol/L — ABNORMAL LOW (ref 101–111)
Creatinine, Ser: 0.41 mg/dL — ABNORMAL LOW (ref 0.44–1.00)
GFR calc Af Amer: >60 mL/min (ref >60)
GFR calc non Af Amer: >60 mL/min (ref >60)
Glucose, Bld: 125 mg/dL — ABNORMAL HIGH (ref 65–99)
Potassium: 4 mmol/L (ref 3.5–5.1)
Sodium: 136 mmol/L (ref 135–145)
Total Bilirubin: 0.6 mg/dL (ref 0.3–1.2)
Total Protein: 7.1 g/dL (ref 6.5–8.1)

## 2017-03-23 LAB — URINALYSIS, ROUTINE W REFLEX MICROSCOPIC
Bilirubin Urine: NEGATIVE
Glucose, UA: NEGATIVE mg/dL
Hgb urine dipstick: NEGATIVE
Ketones, ur: NEGATIVE mg/dL
Leukocytes, UA: NEGATIVE
Nitrite: NEGATIVE
Protein, ur: NEGATIVE mg/dL
Specific Gravity, Urine: 1.008 (ref 1.005–1.030)
pH: 8 (ref 5.0–8.0)

## 2017-03-23 LAB — SEDIMENTATION RATE: Sed Rate: 125 mm/hr — ABNORMAL HIGH (ref 0–22)

## 2017-03-23 LAB — CBG MONITORING, ED: Glucose-Capillary: 119 mg/dL — ABNORMAL HIGH (ref 65–99)

## 2017-03-23 LAB — I-STAT CG4 LACTIC ACID, ED
Lactic Acid, Venous: 1.39 mmol/L (ref 0.5–1.9)
Lactic Acid, Venous: 1.18 mmol/L (ref 0.5–1.9)

## 2017-03-23 LAB — I-STAT BETA HCG BLOOD, ED (MC, WL, AP ONLY): I-stat hCG, quantitative: <5 m[IU]/mL (ref <5)

## 2017-03-23 IMAGING — CT CT PELVIS W/ CM
2 of 3 series · 16 of 46 positions shown, 18 images · IV contrast (ISOVUE)
Comparison: CT of the abdomen and pelvis performed [DATE]

CLINICAL DATA: Assess perirectal abscess. Status post debridement
of abscess several weeks ago. Increasing drainage and pain. Fever.

EXAM:
CT PELVIS WITH CONTRAST
TECHNIQUE: Multidetector CT imaging of the pelvis was performed using the
standard protocol following the bolus administration of intravenous
contrast.
CONTRAST:  100mL [90] IOPAMIDOL ([90]) INJECTION 61%

[Series 3: axial st · axial · 0.79mm/px · z∈[+1009,+1265]mm · 13 of 148 slices shown, 15 images]
[im 10/148  soft-tissue]
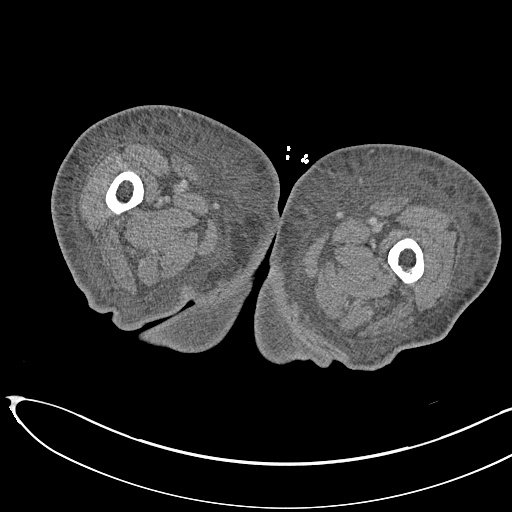
[im 10/148  bone]
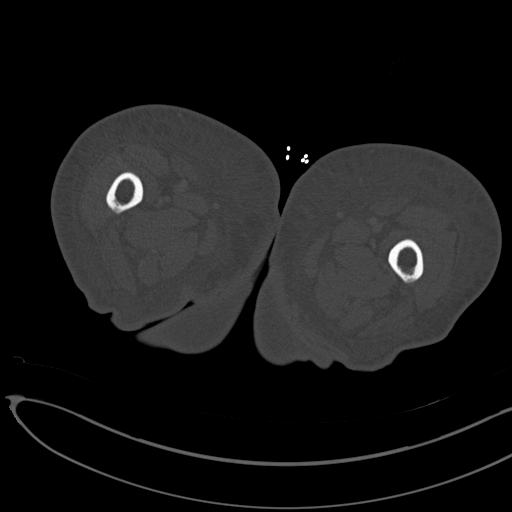
[im 19/148  soft-tissue]
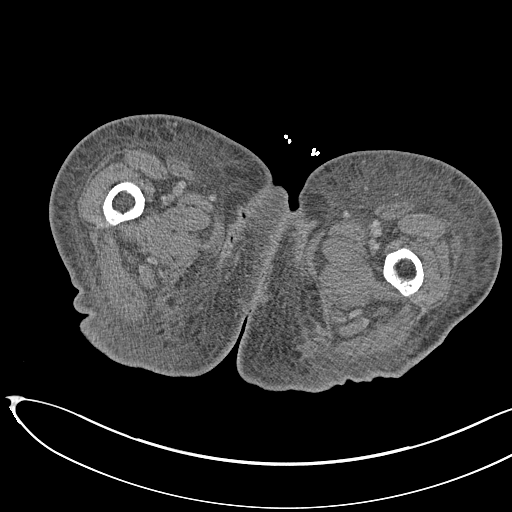
[im 29/148  soft-tissue]
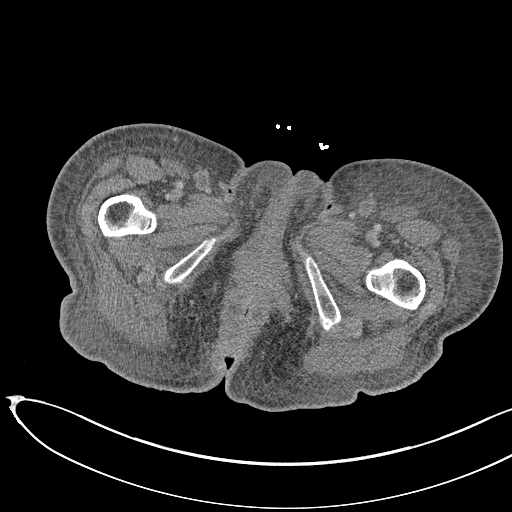
[im 43/148  soft-tissue]
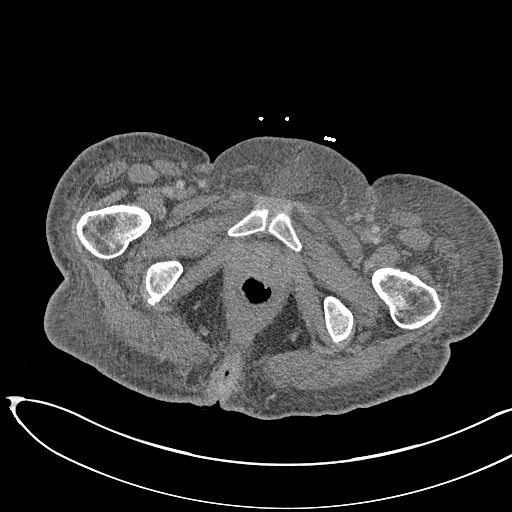
[im 53/148  soft-tissue]
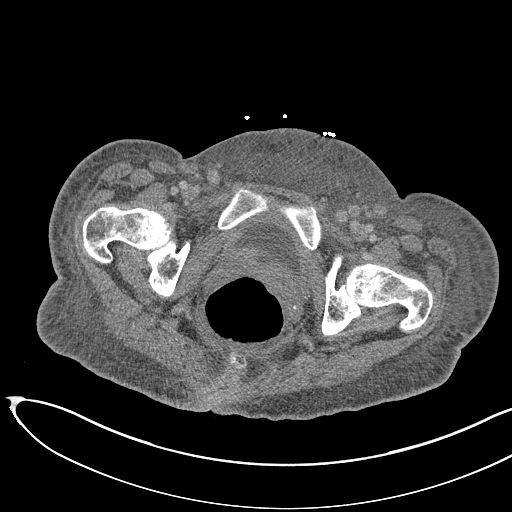
[im 62/148  soft-tissue]
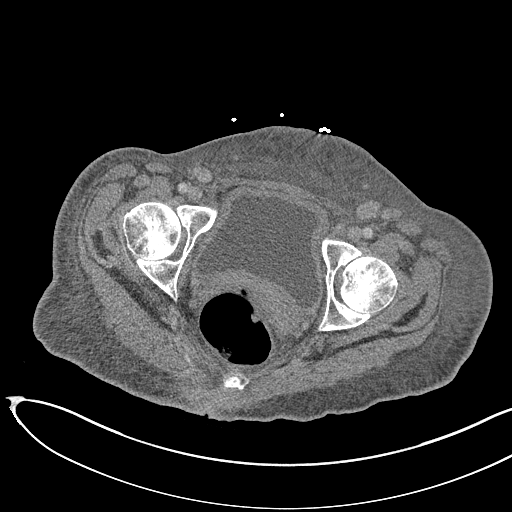
[im 76/148  soft-tissue]
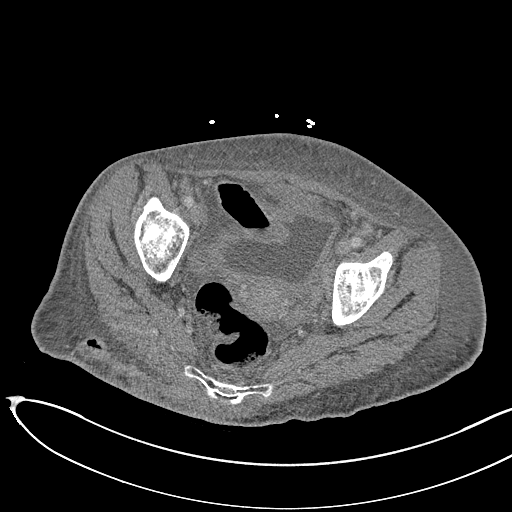
[im 86/148  soft-tissue]
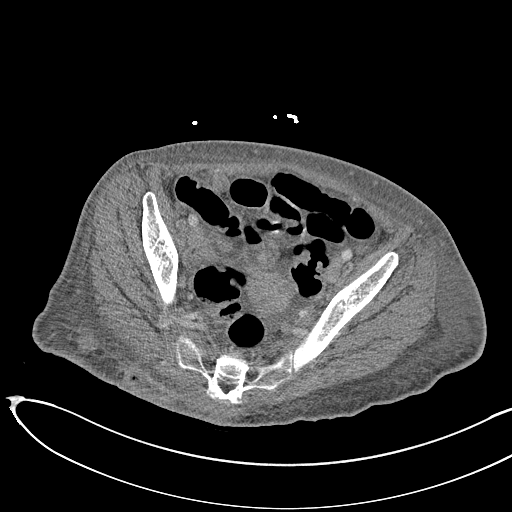
[im 95/148  soft-tissue]
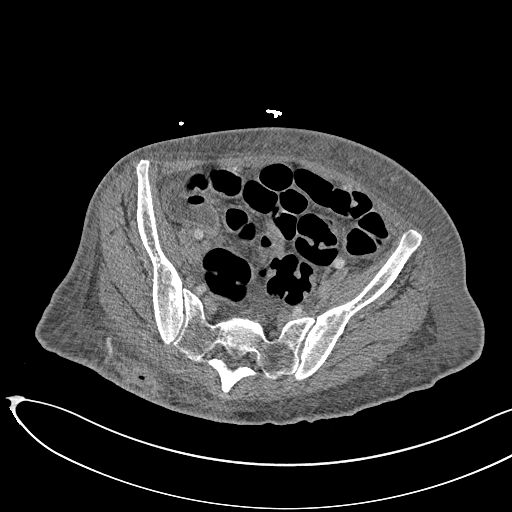
[im 95/148  bone]
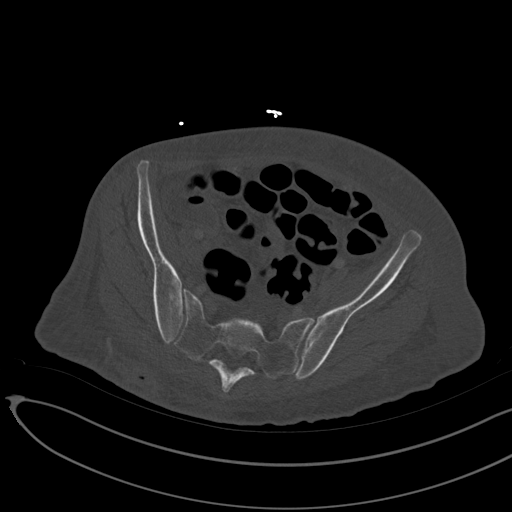
[im 105/148  soft-tissue]
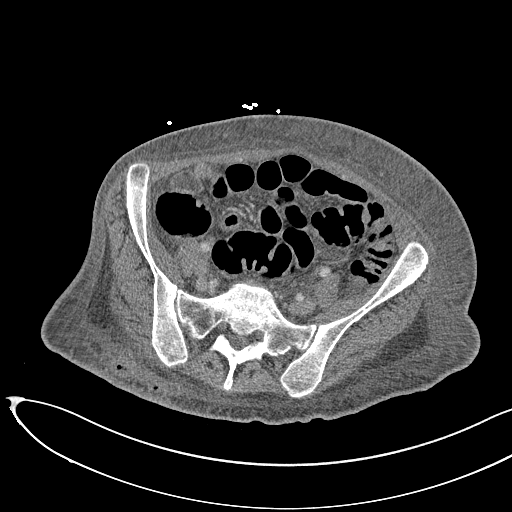
[im 119/148  soft-tissue]
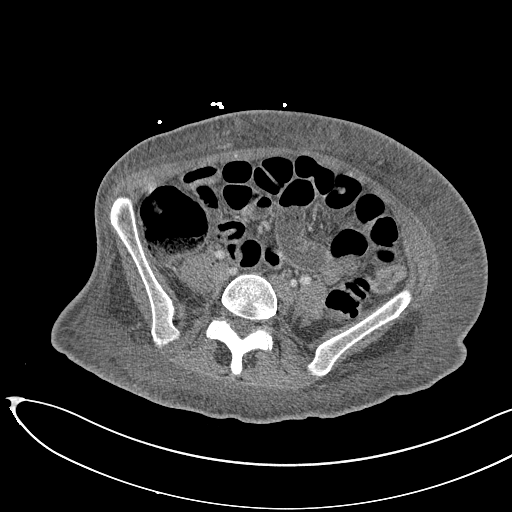
[im 129/148  soft-tissue]
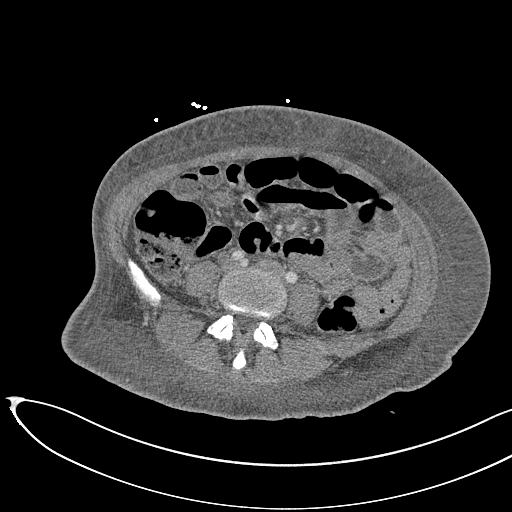
[im 138/148  soft-tissue]
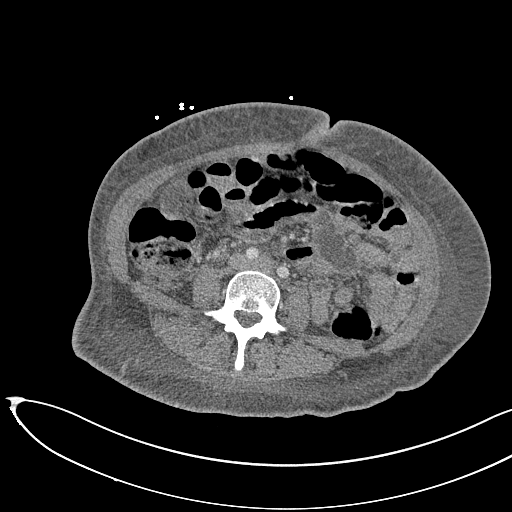

[Series 8: coronal st · coronal · 0.58mm/px · 3 of 127 slices shown]
[im 43/127  soft-tissue]
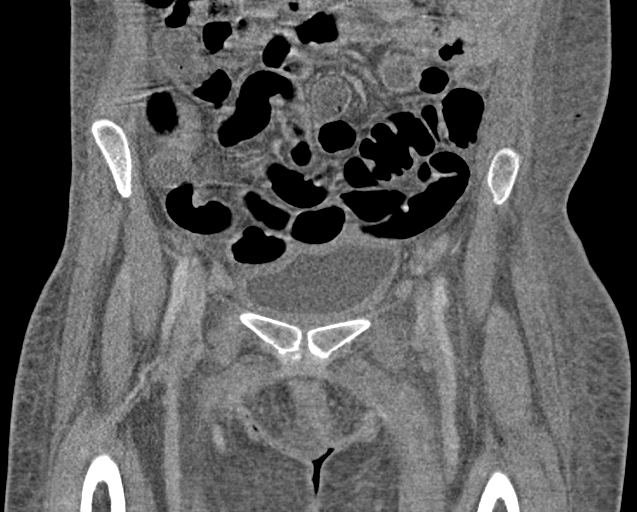
[im 57/127  soft-tissue]
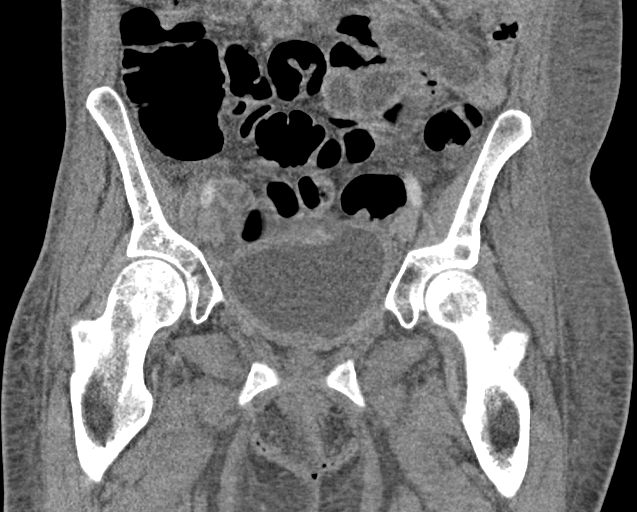
[im 71/127  soft-tissue]
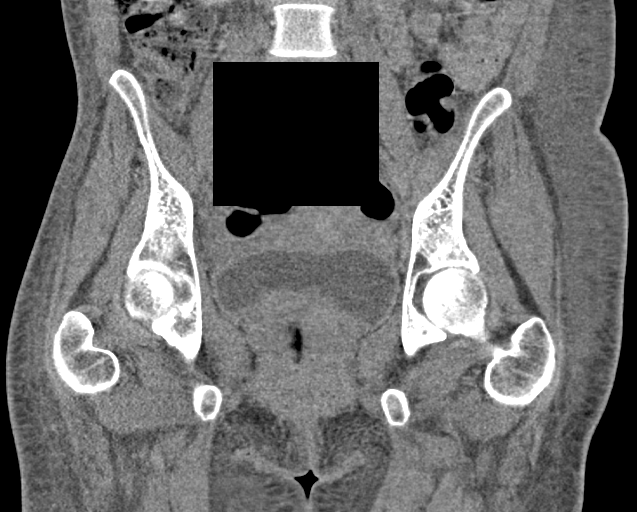

[16 of 46 positions shown; findings below may reference images not displayed]

FINDINGS: Urinary Tract: The bladder is mildly distended and grossly
unremarkable.

Bowel: Visualized small and large bowel loops are grossly
unremarkable.

Vascular/Lymphatic: No acute vascular abnormalities are seen. No
retroperitoneal or pelvic sidewall lymphadenopathy is appreciated.

Reproductive: The uterus is grossly unremarkable. No suspicious
adnexal masses are seen. The ovaries are relatively symmetric.

Other: The previously noted abscess overlying the sacrum and coccyx
has largely resolved. However, there is increasing thin
multiloculated fluid and air tracking along the soft tissues
superficial to the right gluteus musculature, measuring up to 9 cm,
reflecting abscess.

Diffuse soft tissue edema is seen about the pelvis.

Musculoskeletal: No acute osseous abnormalities are identified. The
visualized musculature is unremarkable in appearance.
IMPRESSION: 1. Previously noted abscess overlying the sacrum and coccyx is
largely resolved. However, there is increasing thin multiloculated
abscess tracking along the soft tissues superficial to the right
gluteus musculature, measuring up to 9 cm in length.
2. Diffuse soft tissue edema about the pelvis.

## 2017-03-23 SURGERY — IRRIGATION AND DEBRIDEMENT BUTTOCKS
Anesthesia: General

## 2017-03-23 MED ORDER — METHOCARBAMOL 500 MG PO TABS: 500.0000 mg | ORAL_TABLET | Freq: Three times a day (TID) | ORAL | Status: DC | PRN

## 2017-03-23 MED ORDER — ROCURONIUM BROMIDE 10 MG/ML (PF) SYRINGE
PREFILLED_SYRINGE | INTRAVENOUS | Status: DC | PRN
  Administered 2017-03-23: 30 mg via INTRAVENOUS

## 2017-03-23 MED ORDER — MORPHINE SULFATE (PF) 4 MG/ML IV SOLN
4.0000 mg | Freq: Once | INTRAVENOUS | Status: AC
  Administered 2017-03-23: 4 mg via INTRAVENOUS
  Filled 2017-03-23: qty 1

## 2017-03-23 MED ORDER — DEXTROSE IN LACTATED RINGERS 5 % IV SOLN
INTRAVENOUS | Status: DC | PRN
  Administered 2017-03-23: 11:00:00 via INTRAVENOUS

## 2017-03-23 MED ORDER — ONDANSETRON HCL 4 MG/2ML IJ SOLN
INTRAMUSCULAR | Status: DC | PRN
  Administered 2017-03-23: 4 mg via INTRAVENOUS

## 2017-03-23 MED ORDER — ONDANSETRON HCL 4 MG PO TABS: 4.0000 mg | ORAL_TABLET | Freq: Four times a day (QID) | ORAL | Status: DC | PRN

## 2017-03-23 MED ORDER — INSULIN ASPART 100 UNIT/ML ~~LOC~~ SOLN
2.0000 [IU] | Freq: Once | SUBCUTANEOUS | Status: AC
  Administered 2017-03-23: 2 [IU] via SUBCUTANEOUS

## 2017-03-23 MED ORDER — LOPERAMIDE HCL 2 MG PO CAPS
2.0000 mg | ORAL_CAPSULE | Freq: Three times a day (TID) | ORAL | Status: DC
  Administered 2017-03-23 – 2017-03-24 (×3): 2 mg via ORAL
  Filled 2017-03-23 (×3): qty 1

## 2017-03-23 MED ORDER — FOLIC ACID 1 MG PO TABS
1.0000 mg | ORAL_TABLET | Freq: Every day | ORAL | Status: DC
  Administered 2017-03-23 – 2017-03-24 (×2): 1 mg via ORAL
  Filled 2017-03-23 (×2): qty 1

## 2017-03-23 MED ORDER — DEXTROSE 50 % IV SOLN
25.0000 mL | Freq: Once | INTRAVENOUS | Status: AC
  Administered 2017-03-23: 25 mL via INTRAVENOUS

## 2017-03-23 MED ORDER — DEXTROSE 50 % IV SOLN
1.0000 | Freq: Once | INTRAVENOUS | Status: AC
  Administered 2017-03-23: 50 mL via INTRAVENOUS

## 2017-03-23 MED ORDER — LACTATED RINGERS IV SOLN: INTRAVENOUS | Status: DC

## 2017-03-23 MED ORDER — PROPOFOL 10 MG/ML IV BOLUS
INTRAVENOUS | Status: DC | PRN
  Administered 2017-03-23: 150 mg via INTRAVENOUS

## 2017-03-23 MED ORDER — ROCURONIUM BROMIDE 50 MG/5ML IV SOSY
PREFILLED_SYRINGE | INTRAVENOUS | Status: AC
  Filled 2017-03-23: qty 5

## 2017-03-23 MED ORDER — CHOLESTYRAMINE LIGHT 4 G PO PACK
4.0000 g | PACK | Freq: Three times a day (TID) | ORAL | Status: DC
  Administered 2017-03-23 – 2017-03-24 (×2): 4 g via ORAL
  Filled 2017-03-23 (×4): qty 1

## 2017-03-23 MED ORDER — EPINEPHRINE 0.3 MG/0.3ML IJ SOAJ: 0.3000 mg | INTRAMUSCULAR | Status: DC | PRN

## 2017-03-23 MED ORDER — DEXTROSE IN LACTATED RINGERS 5 % IV SOLN
INTRAVENOUS | Status: DC | PRN
Start: 2017-03-23 — End: 2017-03-23

## 2017-03-23 MED ORDER — SODIUM CHLORIDE 0.9 % IV BOLUS (SEPSIS)
1000.0000 mL | Freq: Once | INTRAVENOUS | Status: AC
  Administered 2017-03-23: 1000 mL via INTRAVENOUS

## 2017-03-23 MED ORDER — CITALOPRAM HYDROBROMIDE 20 MG PO TABS
20.0000 mg | ORAL_TABLET | Freq: Every day | ORAL | Status: DC
  Administered 2017-03-23 – 2017-03-24 (×2): 20 mg via ORAL
  Filled 2017-03-23 (×2): qty 1

## 2017-03-23 MED ORDER — DEXTROSE 50 % IV SOLN
INTRAVENOUS | Status: AC
Start: 2017-03-23 — End: 2017-03-23
  Administered 2017-03-23: 25 mL via INTRAVENOUS
  Filled 2017-03-23: qty 50

## 2017-03-23 MED ORDER — FERROUS SULFATE 325 (65 FE) MG PO TABS
325.0000 mg | ORAL_TABLET | Freq: Two times a day (BID) | ORAL | Status: DC
  Administered 2017-03-23 – 2017-03-24 (×2): 325 mg via ORAL
  Filled 2017-03-23 (×2): qty 1

## 2017-03-23 MED ORDER — LABETALOL HCL 5 MG/ML IV SOLN
INTRAVENOUS | Status: DC | PRN
  Administered 2017-03-23: 2.5 mg via INTRAVENOUS

## 2017-03-23 MED ORDER — BUPIVACAINE-EPINEPHRINE (PF) 0.5% -1:200000 IJ SOLN
INTRAMUSCULAR | Status: DC | PRN
  Administered 2017-03-23: 20 mL

## 2017-03-23 MED ORDER — SODIUM CHLORIDE 0.9 % IV SOLN
INTRAVENOUS | Status: DC
  Administered 2017-03-23: 10:00:00 via INTRAVENOUS

## 2017-03-23 MED ORDER — BUPIVACAINE-EPINEPHRINE 0.5% -1:200000 IJ SOLN
INTRAMUSCULAR | Status: AC
  Filled 2017-03-23: qty 1

## 2017-03-23 MED ORDER — LIDOCAINE 2% (20 MG/ML) 5 ML SYRINGE
INTRAMUSCULAR | Status: AC
  Filled 2017-03-23: qty 5

## 2017-03-23 MED ORDER — INSULIN ASPART 100 UNIT/ML ~~LOC~~ SOLN
SUBCUTANEOUS | Status: AC
  Filled 2017-03-23: qty 1

## 2017-03-23 MED ORDER — INSULIN GLARGINE 100 UNIT/ML ~~LOC~~ SOLN
10.0000 [IU] | Freq: Two times a day (BID) | SUBCUTANEOUS | Status: DC
  Administered 2017-03-23 – 2017-03-24 (×2): 10 [IU] via SUBCUTANEOUS
  Filled 2017-03-23 (×3): qty 0.1

## 2017-03-23 MED ORDER — VANCOMYCIN HCL IN DEXTROSE 1-5 GM/200ML-% IV SOLN
1000.0000 mg | Freq: Two times a day (BID) | INTRAVENOUS | Status: DC
Start: 2017-03-23 — End: 2017-03-24
  Administered 2017-03-23 – 2017-03-24 (×3): 1000 mg via INTRAVENOUS
  Filled 2017-03-23 (×3): qty 200

## 2017-03-23 MED ORDER — SODIUM CHLORIDE 0.9 % IV BOLUS (SEPSIS): 1000.0000 mL | Freq: Once | INTRAVENOUS | Status: DC

## 2017-03-23 MED ORDER — SUCCINYLCHOLINE CHLORIDE 200 MG/10ML IV SOSY
PREFILLED_SYRINGE | INTRAVENOUS | Status: AC
  Filled 2017-03-23: qty 10

## 2017-03-23 MED ORDER — MORPHINE SULFATE (PF) 2 MG/ML IV SOLN
2.0000 mg | INTRAVENOUS | Status: DC | PRN
  Administered 2017-03-23 – 2017-03-24 (×4): 2 mg via INTRAVENOUS
  Filled 2017-03-23 (×4): qty 1

## 2017-03-23 MED ORDER — IOPAMIDOL (ISOVUE-300) INJECTION 61%
INTRAVENOUS | Status: AC
  Filled 2017-03-23: qty 100

## 2017-03-23 MED ORDER — MIDAZOLAM HCL 5 MG/5ML IJ SOLN
INTRAMUSCULAR | Status: DC | PRN
  Administered 2017-03-23: 1 mg via INTRAVENOUS

## 2017-03-23 MED ORDER — LIDOCAINE 2% (20 MG/ML) 5 ML SYRINGE
INTRAMUSCULAR | Status: DC | PRN
  Administered 2017-03-23: 100 mg via INTRAVENOUS

## 2017-03-23 MED ORDER — FENTANYL CITRATE (PF) 100 MCG/2ML IJ SOLN
INTRAMUSCULAR | Status: AC
  Filled 2017-03-23: qty 2

## 2017-03-23 MED ORDER — PIPERACILLIN-TAZOBACTAM 3.375 G IVPB
3.3750 g | Freq: Three times a day (TID) | INTRAVENOUS | Status: DC
  Administered 2017-03-23 – 2017-03-24 (×4): 3.375 g via INTRAVENOUS
  Filled 2017-03-23 (×4): qty 50

## 2017-03-23 MED ORDER — DEXTROSE 50 % IV SOLN
INTRAVENOUS | Status: AC
  Filled 2017-03-23: qty 50

## 2017-03-23 MED ORDER — IOPAMIDOL (ISOVUE-300) INJECTION 61%
100.0000 mL | Freq: Once | INTRAVENOUS | Status: AC | PRN
  Administered 2017-03-23: 100 mL via INTRAVENOUS

## 2017-03-23 MED ORDER — DEXTROSE 50 % IV SOLN
INTRAVENOUS | Status: AC
Start: 2017-03-23 — End: 2017-03-24
  Filled 2017-03-23: qty 50

## 2017-03-23 MED ORDER — DIPHENOXYLATE-ATROPINE 2.5-0.025 MG PO TABS
2.0000 | ORAL_TABLET | Freq: Four times a day (QID) | ORAL | Status: DC
  Administered 2017-03-23 – 2017-03-24 (×4): 2 via ORAL
  Filled 2017-03-23 (×4): qty 2

## 2017-03-23 MED ORDER — SODIUM CHLORIDE 0.9 % IV BOLUS (SEPSIS)
250.0000 mL | Freq: Once | INTRAVENOUS | Status: AC
  Administered 2017-03-23: 250 mL via INTRAVENOUS

## 2017-03-23 MED ORDER — VANCOMYCIN HCL IN DEXTROSE 1-5 GM/200ML-% IV SOLN
1000.0000 mg | Freq: Once | INTRAVENOUS | Status: AC
  Administered 2017-03-23: 1000 mg via INTRAVENOUS
  Filled 2017-03-23: qty 200

## 2017-03-23 MED ORDER — DEXTROSE IN LACTATED RINGERS 5 % IV SOLN
INTRAVENOUS | Status: DC
Start: 2017-03-23 — End: 2017-03-23

## 2017-03-23 MED ORDER — PIPERACILLIN-TAZOBACTAM 3.375 G IVPB 30 MIN
3.3750 g | Freq: Once | INTRAVENOUS | Status: AC
  Administered 2017-03-23: 3.375 g via INTRAVENOUS
  Filled 2017-03-23: qty 50

## 2017-03-23 MED ORDER — FENTANYL CITRATE (PF) 100 MCG/2ML IJ SOLN
INTRAMUSCULAR | Status: DC | PRN
  Administered 2017-03-23: 50 ug via INTRAVENOUS

## 2017-03-23 MED ORDER — SODIUM CHLORIDE 0.9 % IV SOLN
INTRAVENOUS | Status: AC
Start: 2017-03-23 — End: 2017-03-24
  Administered 2017-03-23: 15:00:00 via INTRAVENOUS

## 2017-03-23 MED ORDER — DEXTROSE 50 % IV SOLN
INTRAVENOUS | Status: DC | PRN
  Administered 2017-03-23: 50 mL via INTRAVENOUS
  Administered 2017-03-23: 25 mL via INTRAVENOUS

## 2017-03-23 MED ORDER — ONDANSETRON HCL 4 MG/2ML IJ SOLN: 4.0000 mg | Freq: Four times a day (QID) | INTRAMUSCULAR | Status: DC | PRN

## 2017-03-23 MED ORDER — IBUPROFEN 200 MG PO CAPS: 400.0000 mg | ORAL_CAPSULE | Freq: Two times a day (BID) | ORAL | Status: DC | PRN

## 2017-03-23 MED ORDER — PROMETHAZINE HCL 25 MG/ML IJ SOLN: 6.2500 mg | INTRAMUSCULAR | Status: DC | PRN

## 2017-03-23 MED ORDER — SUCCINYLCHOLINE CHLORIDE 200 MG/10ML IV SOSY
PREFILLED_SYRINGE | INTRAVENOUS | Status: DC | PRN
  Administered 2017-03-23: 100 mg via INTRAVENOUS

## 2017-03-23 MED ORDER — PROPOFOL 10 MG/ML IV BOLUS
INTRAVENOUS | Status: AC
  Filled 2017-03-23: qty 20

## 2017-03-23 MED ORDER — FENTANYL CITRATE (PF) 100 MCG/2ML IJ SOLN
25.0000 ug | INTRAMUSCULAR | Status: DC | PRN
  Administered 2017-03-23: 50 ug via INTRAVENOUS

## 2017-03-23 MED ORDER — PANCRELIPASE (LIP-PROT-AMYL) 12000-38000 UNITS PO CPEP
36000.0000 [IU] | ORAL_CAPSULE | Freq: Three times a day (TID) | ORAL | Status: DC
  Administered 2017-03-23 – 2017-03-24 (×3): 36000 [IU] via ORAL
  Filled 2017-03-23 (×3): qty 3

## 2017-03-23 MED ORDER — MIDAZOLAM HCL 2 MG/2ML IJ SOLN
INTRAMUSCULAR | Status: AC
  Filled 2017-03-23: qty 2

## 2017-03-23 MED ORDER — INSULIN ASPART 100 UNIT/ML ~~LOC~~ SOLN
SUBCUTANEOUS | Status: AC
  Administered 2017-03-23: 2 [IU] via SUBCUTANEOUS
  Filled 2017-03-23: qty 1

## 2017-03-23 MED ORDER — SUGAMMADEX SODIUM 200 MG/2ML IV SOLN
INTRAVENOUS | Status: DC | PRN
  Administered 2017-03-23: 200 mg via INTRAVENOUS

## 2017-03-23 MED ORDER — OXYCODONE-ACETAMINOPHEN 5-325 MG PO TABS
1.0000 | ORAL_TABLET | Freq: Four times a day (QID) | ORAL | Status: DC | PRN
  Administered 2017-03-23 (×2): 1 via ORAL
  Filled 2017-03-23 (×3): qty 1

## 2017-03-23 MED ORDER — DEXAMETHASONE SODIUM PHOSPHATE 10 MG/ML IJ SOLN
INTRAMUSCULAR | Status: DC | PRN
  Administered 2017-03-23: 4 mg via INTRAVENOUS

## 2017-03-23 MED ORDER — IBUPROFEN 200 MG PO TABS: 400.0000 mg | ORAL_TABLET | Freq: Two times a day (BID) | ORAL | Status: DC | PRN

## 2017-03-23 MED ORDER — INSULIN ASPART 100 UNIT/ML ~~LOC~~ SOLN
0.0000 [IU] | SUBCUTANEOUS | Status: DC
  Administered 2017-03-23: 3 [IU] via SUBCUTANEOUS
  Administered 2017-03-23: 7 [IU] via SUBCUTANEOUS
  Administered 2017-03-24 (×2): 1 [IU] via SUBCUTANEOUS
  Administered 2017-03-24: 2 [IU] via SUBCUTANEOUS

## 2017-03-23 MED ORDER — GLUCERNA SHAKE PO LIQD
237.0000 mL | Freq: Two times a day (BID) | ORAL | Status: DC
  Administered 2017-03-23 – 2017-03-24 (×3): 237 mL via ORAL
  Filled 2017-03-23 (×3): qty 237

## 2017-03-23 MED ORDER — GABAPENTIN 300 MG PO CAPS
300.0000 mg | ORAL_CAPSULE | Freq: Two times a day (BID) | ORAL | Status: DC
  Administered 2017-03-23 – 2017-03-24 (×2): 300 mg via ORAL
  Filled 2017-03-23 (×2): qty 1

## 2017-03-23 SURGICAL SUPPLY — 28 items
BLADE SURG SZ10 CARB STEEL (BLADE) ×2 IMPLANT
DECANTER SPIKE VIAL GLASS SM (MISCELLANEOUS) IMPLANT
DERMABOND ADVANCED (GAUZE/BANDAGES/DRESSINGS)
DERMABOND ADVANCED .7 DNX12 (GAUZE/BANDAGES/DRESSINGS) IMPLANT
DRAIN PENROSE 18X1/2 LTX STRL (DRAIN) ×2 IMPLANT
DRAPE LAPAROTOMY TRNSV 102X78 (DRAPE) ×2 IMPLANT
DRSG PAD ABDOMINAL 8X10 ST (GAUZE/BANDAGES/DRESSINGS) ×2 IMPLANT
ELECT PENCIL ROCKER SW 15FT (MISCELLANEOUS) ×2 IMPLANT
ELECT REM PT RETURN 15FT ADLT (MISCELLANEOUS) ×2 IMPLANT
GAUZE SPONGE 4X4 12PLY STRL (GAUZE/BANDAGES/DRESSINGS) ×2 IMPLANT
GLOVE BIOGEL PI IND STRL 7.0 (GLOVE) ×1 IMPLANT
GLOVE BIOGEL PI INDICATOR 7.0 (GLOVE) ×1
GOWN STRL REUS W/TWL 2XL LVL3 (GOWN DISPOSABLE) ×2 IMPLANT
GOWN STRL REUS W/TWL XL LVL3 (GOWN DISPOSABLE) ×2 IMPLANT
KIT BASIN OR (CUSTOM PROCEDURE TRAY) ×2 IMPLANT
NEEDLE HYPO 22GX1.5 SAFETY (NEEDLE) ×2 IMPLANT
PACK BASIC VI WITH GOWN DISP (CUSTOM PROCEDURE TRAY) ×2 IMPLANT
SPONGE LAP 18X18 X RAY DECT (DISPOSABLE) ×2 IMPLANT
STAPLER VISISTAT 35W (STAPLE) IMPLANT
SUT SILK 2 0 (SUTURE) ×1
SUT SILK 2-0 18XBRD TIE 12 (SUTURE) ×1 IMPLANT
SUT VIC AB 3-0 SH 18 (SUTURE) IMPLANT
SUT VIC AB 4-0 PS2 18 (SUTURE) IMPLANT
SYR 20CC LL (SYRINGE) ×2 IMPLANT
TAPE CLOTH SURG 6X10 WHT LF (GAUZE/BANDAGES/DRESSINGS) ×2 IMPLANT
TOWEL OR 17X26 10 PK STRL BLUE (TOWEL DISPOSABLE) ×2 IMPLANT
TOWEL OR NON WOVEN STRL DISP B (DISPOSABLE) ×2 IMPLANT
YANKAUER SUCT BULB TIP 10FT TU (MISCELLANEOUS) ×2 IMPLANT

## 2017-03-23 NOTE — Anesthesia Procedure Notes (Signed)
Procedure Name: Intubation Date/Time: 03/23/2017 10:31 AM Performed by: Lind Covert, CRNA Pre-anesthesia Checklist: Patient identified, Emergency Drugs available, Suction available, Patient being monitored and Timeout performed Patient Re-evaluated:Patient Re-evaluated prior to induction Oxygen Delivery Method: Circle system utilized Preoxygenation: Pre-oxygenation with 100% oxygen Induction Type: IV induction Laryngoscope Size: Mac and 4 Grade View: Grade I Tube type: Oral Tube size: 7.0 mm Number of attempts: 1 Airway Equipment and Method: Stylet Placement Confirmation: ETT inserted through vocal cords under direct vision,  positive ETCO2 and breath sounds checked- equal and bilateral Secured at: 22 cm Tube secured with: Tape Dental Injury: Teeth and Oropharynx as per pre-operative assessment

## 2017-03-23 NOTE — Progress Notes (Signed)
Hypoglycemic Event  CBG: 63  Treatment: D50 IV 25 mL, Novalog 2 units SQ  Symptoms: None  Possible Reasons for Event: Inadequate meal intake and Other: Type I Diabetic no insulin since yesterday evening.  Comments/MD notified:Brock    Frazier Richards, Rae Roam

## 2017-03-23 NOTE — Transfer of Care (Signed)
Immediate Anesthesia Transfer of Care Note  Patient: [name]  Procedure(s) Performed: IRRIGATION AND DEBRIDEMENT BUTTOCKS, SETON PLACEMENT (N/A )  Patient Location: PACU  Anesthesia Type:General  Level of Consciousness: sedated  Airway & Oxygen Therapy: Patient Spontanous Breathing and Patient connected to face mask oxygen  Post-op Assessment: Report given to RN and Post -op Vital signs reviewed and stable  Post vital signs: Reviewed and stable  Last Vitals:  Vitals:   03/23/17 0445 03/23/17 0641  BP:  (!) 153/99  Pulse: 96 99  Resp: 17 17  Temp:    SpO2: 93% 96%    Last Pain:  Vitals:   03/23/17 0759  TempSrc:   PainSc: 8          Complications: No apparent anesthesia complications

## 2017-03-23 NOTE — ED Provider Notes (Addendum)
[facility] DEPT Provider Note   CSN: [id number] Arrival date & time: 03/23/17  0218     History   Chief Complaint Chief Complaint  Patient presents with  . Hypoglycemia    HPI [name] is a 26 y.o. female.  HPI 26 year old female with history of chronic diarrhea, type 1 diabetes mellitus, perirectal abscess requiring operative debridement on 12/2 presents with chief complaint of low blood glucose and fevers. Patient reports that while sent home she started having sweats and feeling nauseated and weak.  Patient checked her blood glucose and it was 55.  Patient has been having fevers, with T-max of 100.1.  Patient is also complaining of increased pain at the site of the incision wound.  On exam there is yellow drainage appreciated from the incision site.   Past Medical History:  Diagnosis Date  . Anxiety   . Crohn's disease ([place])   . Diabetes type 1, uncontrolled ([place]) 11/14/2011   Since age 45  . Fibromyalgia   . Gastroparesis   . Hypertension   . Infection    UTI April 2016    Patient Active Problem List   Diagnosis Date Noted  . Malnutrition of moderate degree 03/19/2017  . Anasarca 03/16/2017  . MDD (major depressive disorder), recurrent episode, mild ([place]) 02/28/2017  . Diarrhea 02/27/2017  . Lower GI bleed 02/27/2017  . DKA (diabetic ketoacidosis) ([place]) 02/26/2017  . Acute metabolic encephalopathy   . DKA, type 1 ([place]) 06/11/2013  . Orthostatic hypotension dysautonomic syndrome ([place]) 04/28/2013  . Body aches 04/28/2013  . Orthostatic hypotension 04/28/2013  . Diabetes mellitus type 1 ([place]) 04/28/2013  . Protein-calorie malnutrition, severe ([place]) 04/28/2013  . Noncompliance with diabetes treatment 09/01/2012  . Dyspnea 02/28/2012  . Sinus tachycardia 02/28/2012  . Hyperglycemia without ketosis 02/28/2012  . Chest pain 12/04/2011  . Diabetes type 1, uncontrolled ([place]) 11/14/2011  . Hypertension associated with diabetes  ([place]) 11/14/2011  . Gastroparesis 11/14/2011  . Hyponatremia 09/19/2011  . Abdominal pain 09/18/2011  . Metabolic alkalosis 40/98/1191  . Diabetic hyperosmolar non-ketotic state ([place]) 09/18/2011  . DIAB W/UNSPEC COMP TYPE I [JUV TYPE] UNCNTRL 06/04/2010  . CONSTIPATION 01/01/2010  . RECTAL PAIN 01/01/2010    Past Surgical History:  Procedure Laterality Date  . ANKLE SURGERY    . CHOLECYSTECTOMY  11/15/2011   Procedure: LAPAROSCOPIC CHOLECYSTECTOMY WITH INTRAOPERATIVE CHOLANGIOGRAM;  Surgeon: [name], MD;  Location: [facility];  Service: General;  Laterality: N/A;  . ESOPHAGOGASTRODUODENOSCOPY  12/03/2011   Procedure: ESOPHAGOGASTRODUODENOSCOPY (EGD);  Surgeon: Beryle Beams, MD;  Location: [facility];  Service: Endoscopy;  Laterality: N/A;  . FLEXIBLE SIGMOIDOSCOPY N/A 03/10/2017   Procedure: FLEXIBLE SIGMOIDOSCOPY;  Surgeon: Carol Ada, MD;  Location: [facility];  Service: Endoscopy;  Laterality: N/A;  . INCISION AND DRAINAGE PERIRECTAL ABSCESS N/A 03/01/2017   Procedure: IRRIGATION AND DEBRIDEMENT PERIRECTAL ABSCESS;  Surgeon: Alphonsa Overall, MD;  Location: [facility];  Service: General;  Laterality: N/A;  . LAPAROSCOPY  11/23/2011   Procedure: LAPAROSCOPY DIAGNOSTIC;  Surgeon: Edward Jolly, MD;  Location: [facility];  Service: General;  Laterality: N/A;    OB History    Gravida Para Term Preterm AB Living   1 1   1   1    SAB TAB Ectopic Multiple Live Births         0 1       Home Medications    Prior to Admission medications   Medication Sig Start Date End Date Taking?  Authorizing Provider  cholestyramine light (PREVALITE) 4 g packet Take 1 packet (4 g total) by mouth 3 (three) times daily. 03/14/17  Yes Lavina Hamman, MD  citalopram (CELEXA) 20 MG tablet Take 1 tablet (20 mg total) by mouth daily. 03/15/17  Yes Lavina Hamman, MD  diphenoxylate-atropine (LOMOTIL) 2.5-0.025 MG tablet Take 2 tablets by mouth 4 (four) times daily. 03/14/17  Yes Lavina Hamman, MD    feeding supplement, GLUCERNA SHAKE, (GLUCERNA SHAKE) LIQD Take 237 mLs by mouth 2 (two) times daily between meals. 03/14/17  Yes Lavina Hamman, MD  ferrous sulfate 325 (65 FE) MG tablet Take 1 tablet (325 mg total) by mouth 2 (two) times daily with a meal. 03/14/17  Yes Lavina Hamman, MD  folic acid (FOLVITE) 1 MG tablet Take 1 tablet (1 mg total) by mouth daily. 03/15/17  Yes Lavina Hamman, MD  furosemide (LASIX) 40 MG tablet Take 1 tablet (40 mg total) by mouth daily. 03/21/17 04/20/17 Yes Aline August, MD  gabapentin (NEURONTIN) 300 MG capsule Take 1 capsule (300 mg total) by mouth 2 (two) times daily. 03/14/17  Yes Lavina Hamman, MD  Ibuprofen (MIDOL) 200 MG CAPS Take 400 mg by mouth 2 (two) times daily as needed (pain).    Yes [provider]  insulin lispro (HUMALOG) 100 UNIT/ML injection BG 70 - 120: 0 units BG 121 - 150: 1 unit BG 151 - 200: 2 units BG 201 - 250: 3 units BG 251 - 300: 5 units BG 301 - 350: 7 units BG 351 - 400: 9 units Patient taking differently: Inject 1-9 Units into the skin 3 (three) times daily with meals. BG 70 - 120: 0 units BG 121 - 150: 1 unit BG 151 - 200: 2 units BG 201 - 250: 3 units BG 251 - 300: 5 units BG 301 - 350: 7 units BG 351 - 400: 9 units 03/14/17  Yes Lavina Hamman, MD  LANTUS 100 UNIT/ML injection Inject 10 Units into the skin 2 (two) times daily. 03/15/17  Yes [provider]  lipase/protease/amylase (CREON) 36000 UNITS CPEP capsule Take 1 capsule (36,000 Units total) by mouth 3 (three) times daily with meals. 03/14/17  Yes Lavina Hamman, MD  loperamide (IMODIUM) 2 MG capsule Take 1 capsule (2 mg total) by mouth 3 (three) times daily before meals. DO NOT TAKE IT FOR THE DAY YOU DONT HAVE A BOWEL MOVEMENT. 03/14/17  Yes Lavina Hamman, MD  methocarbamol (ROBAXIN) 500 MG tablet Take 1 tablet (500 mg total) by mouth every 8 (eight) hours as needed for muscle spasms. 03/14/17  Yes Lavina Hamman, MD  oxyCODONE-acetaminophen  (PERCOCET/ROXICET) 5-325 MG tablet Take 1 tablet by mouth every 6 (six) hours as needed for severe pain. 03/21/17  Yes Aline August, MD  albuterol (PROVENTIL HFA;VENTOLIN HFA) 108 (90 Base) MCG/ACT inhaler Inhale 1-2 puffs into the lungs every 4 (four) hours as needed for wheezing or shortness of breath. Patient not taking: Reported on 03/15/2017 09/02/16   Street, [place], PA-C  EPINEPHrine (EPIPEN 2-PAK) 0.3 mg/0.3 mL IJ SOAJ injection Inject 0.3 mLs (0.3 mg total) into the muscle as needed (allergic reaction). 03/21/17   Aline August, MD  glucose 4 GM chewable tablet Chew 1 tablet (4 g total) by mouth as needed for low blood sugar. Patient not taking: Reported on 03/23/2017 08/10/14   Morene Crocker, CNM  traMADol (ULTRAM) 50 MG tablet Take 1 tablet (50 mg total) by mouth every  6 (six) hours as needed for moderate pain. Patient not taking: Reported on 03/23/2017 03/14/17   Lavina Hamman, MD    Family History Family History  Problem Relation Age of Onset  . Diabetes Mother   . Hypertension Father     Social History Social History   Tobacco Use  . Smoking status: Never Smoker  . Smokeless tobacco: Never Used  Substance Use Topics  . Alcohol use: Yes    Alcohol/week: 0.0 oz    Comment: Once every couple of months   . Drug use: No    Comment: Previous use     Allergies   Peanut-containing drug products and Lactose intolerance (gi)   Review of Systems Review of Systems  Constitutional: Positive for activity change and fever.  Respiratory: Negative for shortness of breath.   Cardiovascular: Negative for chest pain.  Gastrointestinal: Positive for nausea.  Allergic/Immunologic: Positive for immunocompromised state.  Hematological: Does not bruise/bleed easily.  All other systems reviewed and are negative.    Physical Exam Updated Vital Signs BP (!) 153/99   Pulse 99   Temp 98.4 F (36.9 C) (Rectal)   Resp 17   LMP 02/23/2017 Comment: negative beta HCG  03/23/17  SpO2 96%   Physical Exam  Constitutional: She is oriented to person, place, and time. She appears well-developed.  HENT:  Head: Normocephalic and atraumatic.  Eyes: EOM are normal.  Neck: Normal range of motion. Neck supple.  Cardiovascular: Normal rate.  Pulmonary/Chest: Effort normal.  Abdominal: Soft. Bowel sounds are normal. She exhibits no distension. There is no tenderness.  Genitourinary:  Genitourinary Comments: Patient has a large incisional wound in the perirectal region.  There is yellow drainage from the incision site.  Positive induration.  No fluctuance.  Neurological: She is alert and oriented to person, place, and time.  Skin: Skin is warm and dry.  Nursing note and vitals reviewed.    ED Treatments / Results  Labs (all labs ordered are listed, but only abnormal results are displayed) Labs Reviewed  COMPREHENSIVE METABOLIC PANEL - Abnormal; Notable for the following components:      Result Value   Chloride 99 (*)    Glucose, Bld 125 (*)    Creatinine, Ser 0.41 (*)    Calcium 8.4 (*)    Albumin 2.2 (*)    AST 51 (*)    All other components within normal limits  CBC WITH DIFFERENTIAL/PLATELET - Abnormal; Notable for the following components:   RBC 2.83 (*)    Hemoglobin 8.6 (*)    HCT 27.0 (*)    All other components within normal limits  URINALYSIS, ROUTINE W REFLEX MICROSCOPIC - Abnormal; Notable for the following components:   Color, Urine STRAW (*)    All other components within normal limits  SEDIMENTATION RATE - Abnormal; Notable for the following components:   Sed Rate 125 (*)    All other components within normal limits  CBG MONITORING, ED - Abnormal; Notable for the following components:   Glucose-Capillary 119 (*)    All other components within normal limits  CULTURE, BLOOD (ROUTINE X 2)  CULTURE, BLOOD (ROUTINE X 2)  AEROBIC CULTURE (SUPERFICIAL SPECIMEN)  C-REACTIVE PROTEIN  I-STAT BETA HCG BLOOD, ED ([facility], [facility], [facility] ONLY)  I-STAT  CG4 LACTIC ACID, ED  I-STAT CG4 LACTIC ACID, ED    EKG  EKG Interpretation None       Radiology Ct Pelvis W Contrast  Result Date: 03/23/2017 CLINICAL DATA:  Assess perirectal abscess.  Status post debridement of abscess several weeks ago. Increasing drainage and pain. Fever. EXAM: CT PELVIS WITH CONTRAST TECHNIQUE: Multidetector CT imaging of the pelvis was performed using the standard protocol following the bolus administration of intravenous contrast. CONTRAST:  129m ISOVUE-300 IOPAMIDOL (ISOVUE-300) INJECTION 61% COMPARISON:  CT of the abdomen and pelvis performed 03/15/2017 FINDINGS: Urinary Tract: The bladder is mildly distended and grossly unremarkable. Bowel: Visualized small and large bowel loops are grossly unremarkable. Vascular/Lymphatic: No acute vascular abnormalities are seen. No retroperitoneal or pelvic sidewall lymphadenopathy is appreciated. Reproductive: The uterus is grossly unremarkable. No suspicious adnexal masses are seen. The ovaries are relatively symmetric. Other: The previously noted abscess overlying the sacrum and coccyx has largely resolved. However, there is increasing thin multiloculated fluid and air tracking along the soft tissues superficial to the right gluteus musculature, measuring up to 9 cm, reflecting abscess. Diffuse soft tissue edema is seen about the pelvis. Musculoskeletal: No acute osseous abnormalities are identified. The visualized musculature is unremarkable in appearance. IMPRESSION: 1. Previously noted abscess overlying the sacrum and coccyx is largely resolved. However, there is increasing thin multiloculated abscess tracking along the soft tissues superficial to the right gluteus musculature, measuring up to 9 cm in length. 2. Diffuse soft tissue edema about the pelvis. Electronically Signed   By: JGarald BaldingM.D.   On: 03/23/2017 05:56    Procedures Procedures (including critical care time)  Medications Ordered in ED Medications    sodium chloride 0.9 % bolus 1,000 mL (0 mLs Intravenous Canceled Entry 03/23/17 0330)    And  sodium chloride 0.9 % bolus 1,000 mL (1,000 mLs Intravenous New Bag/Given 03/23/17 0420)    And  sodium chloride 0.9 % bolus 250 mL (250 mLs Intravenous New Bag/Given 03/23/17 0640)  iopamidol (ISOVUE-300) 61 % injection (not administered)  piperacillin-tazobactam (ZOSYN) IVPB 3.375 g (0 g Intravenous Stopped 03/23/17 0423)  vancomycin (VANCOCIN) IVPB 1000 mg/200 mL premix (0 mg Intravenous Stopped 03/23/17 0530)  morphine 4 MG/ML injection 4 mg (4 mg Intravenous Given 03/23/17 0432)  iopamidol (ISOVUE-300) 61 % injection 100 mL (100 mLs Intravenous Contrast Given 03/23/17 0457)     Initial Impression / Assessment and Plan / ED Course  I have reviewed the triage vital signs and the nursing notes.  Pertinent labs & imaging results that were available during my care of the patient were reviewed by me and considered in my medical decision making (see chart for details).  Clinical Course as of Mar 23 730  Mon Mar 23, 2017  0730 New multiloculated abscess appreciated on the CT pelvis.  General surgery has been consulted.  Patient is already status post antibiotics in the ED.  Lactic acid is < 2. CT Pelvis W Contrast [AN]    Clinical Course User Index [AN] NVarney Biles MD    Patient comes in with chief complaint of low-grade fever and low blood glucose.  Patient has history of insulin-dependent diabetes, Crohn's disease with perirectal abscess status post debridement on December 2.  Patient has an open wound that is actively draining.  Patient reports increased back pain along with drainage.  Sepsis workup initiated.  Patient is noted to be tachycardic and tachypneic.  Patient has had 2 CT scans this month.  I discussed the case with Dr. 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  Dr. WDonne Hazelreported that there is no way around getting a CT  scan of the pelvis... Does have ordered a CT  scan.  Patient has been started on broad-spectrum antibiotics. If the CT scan does not show worsening of the fistula patient can be safely discharged as long as there is no other source of infection. Final Clinical Impressions(s) / ED Diagnoses   Final diagnoses:  Perirectal abscess    ED Discharge Orders    None       Varney Biles, MD 03/23/17 Lauris Poag    Varney Biles, MD 03/23/17 [phone]

## 2017-03-23 NOTE — Progress Notes (Signed)
Patient said that the pain medication she was given earlier didn't provide any relief, RN paged the on-call doctor.

## 2017-03-23 NOTE — Anesthesia Preprocedure Evaluation (Addendum)
Anesthesia Evaluation  Patient identified by MRN, date of birth, ID band Patient awake    Reviewed: Allergy & Precautions, NPO status , Patient's Chart, lab work & pertinent test results  History of Anesthesia Complications Negative for: history of anesthetic complications  Airway Mallampati: II  TM Distance: >3 FB Neck ROM: Full    Dental  (+) Teeth Intact, Chipped   Pulmonary neg pulmonary ROS,    breath sounds clear to auscultation       Cardiovascular hypertension, Pt. on medications  Rhythm:Regular     Neuro/Psych PSYCHIATRIC DISORDERS Anxiety Depression negative neurological ROS     GI/Hepatic Neg liver ROS, (+)     substance abuse  , Persistent diarrhea (c diff precautions); Crohn's disease, hx gastroparesis   Endo/Other  diabetes, Poorly Controlled, Type 1, Insulin Dependent  Renal/GU negative Renal ROS     Musculoskeletal  (+) Fibromyalgia -, narcotic dependent  Abdominal   Peds  Hematology  (+) anemia ,   Anesthesia Other Findings Poor nutritional status with low albumin  Reproductive/Obstetrics                            Anesthesia Physical  Anesthesia Plan  ASA: III and emergent  Anesthesia Plan: General   Post-op Pain Management:    Induction: Intravenous  PONV Risk Score and Plan: 3 and Ondansetron, Treatment may vary due to age or medical condition, Midazolam and Scopolamine patch - Pre-op  Airway Management Planned: Oral ETT  Additional Equipment: None  Intra-op Plan:   Post-operative Plan: Extubation in OR  Informed Consent: I have reviewed the patients History and Physical, chart, labs and discussed the procedure including the risks, benefits and alternatives for the proposed anesthesia with the patient or authorized representative who has indicated his/her understanding and acceptance.   Dental advisory given  Plan Discussed with: CRNA  Anesthesia  Plan Comments:        Anesthesia Quick Evaluation

## 2017-03-23 NOTE — Anesthesia Postprocedure Evaluation (Signed)
Anesthesia Post Note  Patient: [name]  Procedure(s) Performed: IRRIGATION AND DEBRIDEMENT BUTTOCKS, SETON PLACEMENT (N/A )     Patient location during evaluation: PACU Anesthesia Type: General Level of consciousness: awake and alert Pain management: pain level controlled Vital Signs Assessment: post-procedure vital signs reviewed and stable Respiratory status: spontaneous breathing, nonlabored ventilation and respiratory function stable Cardiovascular status: blood pressure returned to baseline and stable Postop Assessment: no apparent nausea or vomiting Anesthetic complications: no    Last Vitals:  Vitals:   03/23/17 1215 03/23/17 1230  BP: (!) 147/97 (!) 147/102  Pulse: 99 99  Resp: 11 14  Temp:    SpO2: 96% 96%                  Beryle Lathe

## 2017-03-23 NOTE — ED Notes (Signed)
Patient transported to CT 

## 2017-03-23 NOTE — Consult Note (Signed)
Reason for Consult: gluteal abscess Referring Physician: Dr   [name] is an 26 y.o. female.  HPI: Dr Shanon Brow 26 year old female with history of chronic diarrhea, type 1 diabetes mellitus, perirectal abscess requiring operative debridement on 12/2 presents with chief complaint of low blood glucose levels and fevers. Patient reports that after being sent home, she started having sweats and feeling nauseated and weak.  Patient checked her blood glucose and it was 55.  Patient has been having low grade fevers, with T-max of 100.1.  Patient is also complaining of increased pain at the site of the incision wound.  On exam there is yellow drainage appreciated from the incision site.   Past Medical History:  Diagnosis Date  . Anxiety   . Crohn's disease ([place])   . Diabetes type 1, uncontrolled ([place]) 11/14/2011   Since age 89  . Fibromyalgia   . Gastroparesis   . Hypertension   . Infection    UTI April 2016    Past Surgical History:  Procedure Laterality Date  . ANKLE SURGERY    . CHOLECYSTECTOMY  11/15/2011   Procedure: LAPAROSCOPIC CHOLECYSTECTOMY WITH INTRAOPERATIVE CHOLANGIOGRAM;  Surgeon: [name], MD;  Location: [facility];  Service: General;  Laterality: N/A;  . ESOPHAGOGASTRODUODENOSCOPY  12/03/2011   Procedure: ESOPHAGOGASTRODUODENOSCOPY (EGD);  Surgeon: Beryle Beams, MD;  Location: [facility];  Service: Endoscopy;  Laterality: N/A;  . FLEXIBLE SIGMOIDOSCOPY N/A 03/10/2017   Procedure: FLEXIBLE SIGMOIDOSCOPY;  Surgeon: Carol Ada, MD;  Location: [facility];  Service: Endoscopy;  Laterality: N/A;  . INCISION AND DRAINAGE PERIRECTAL ABSCESS N/A 03/01/2017   Procedure: IRRIGATION AND DEBRIDEMENT PERIRECTAL ABSCESS;  Surgeon: Alphonsa Overall, MD;  Location: [facility];  Service: General;  Laterality: N/A;  . LAPAROSCOPY  11/23/2011   Procedure: LAPAROSCOPY DIAGNOSTIC;  Surgeon: Edward Jolly, MD;  Location: [facility];  Service: General;  Laterality: N/A;    Family History   Problem Relation Age of Onset  . Diabetes Mother   . Hypertension Father     Social History:  reports that  has never smoked. she has never used smokeless tobacco. She reports that she drinks alcohol. She reports that she does not use drugs.  Allergies:  Allergies  Allergen Reactions  . Peanut-Containing Drug Products Anaphylaxis and Other (See Comments)    Pt states that she is allergic to [place] nuts; can tolerate peanuts  . Lactose Intolerance (Gi) Other (See Comments)    Gi upset     Medications: I have reviewed the patient's current medications.  Results for orders placed or performed during the hospital encounter of 03/23/17 (from the past 48 hour(s))  CBG monitoring, ED     Status: Abnormal   Collection Time: 03/23/17  2:49 AM  Result Value Ref Range   Glucose-Capillary 119 (H) 65 - 99 mg/dL  I-Stat beta hCG blood, ED     Status: None   Collection Time: 03/23/17  3:00 AM  Result Value Ref Range   I-stat hCG, quantitative <5.0 <5 mIU/mL   Comment 3            Comment:   GEST. AGE      CONC.  (mIU/mL)   <=1 WEEK        5 - 50     2 WEEKS       50 - 500     3 WEEKS       100 - 10,000     4 WEEKS     1,000 -  30,000        FEMALE AND NON-PREGNANT FEMALE:     LESS THAN 5 mIU/mL   Comprehensive metabolic panel     Status: Abnormal   Collection Time: 03/23/17  3:05 AM  Result Value Ref Range   Sodium 136 135 - 145 mmol/L   Potassium 4.0 3.5 - 5.1 mmol/L   Chloride 99 (L) 101 - 111 mmol/L   CO2 28 22 - 32 mmol/L   Glucose, Bld 125 (H) 65 - 99 mg/dL   BUN 12 6 - 20 mg/dL   Creatinine, Ser 0.41 (L) 0.44 - 1.00 mg/dL   Calcium 8.4 (L) 8.9 - 10.3 mg/dL   Total Protein 7.1 6.5 - 8.1 g/dL   Albumin 2.2 (L) 3.5 - 5.0 g/dL   AST 51 (H) 15 - 41 U/L   ALT 20 14 - 54 U/L   Alkaline Phosphatase 99 38 - 126 U/L   Total Bilirubin 0.6 0.3 - 1.2 mg/dL   GFR calc non Af Amer >60 >60 mL/min   GFR calc Af Amer >60 >60 mL/min    Comment: (NOTE) The eGFR has been calculated using  the CKD EPI equation. This calculation has not been validated in all clinical situations. eGFR's persistently <60 mL/min signify possible Chronic Kidney Disease.    Anion gap 9 5 - 15  CBC WITH DIFFERENTIAL     Status: Abnormal   Collection Time: 03/23/17  3:05 AM  Result Value Ref Range   WBC 6.5 4.0 - 10.5 K/uL   RBC 2.83 (L) 3.87 - 5.11 MIL/uL   Hemoglobin 8.6 (L) 12.0 - 15.0 g/dL   HCT 27.0 (L) 36.0 - 46.0 %   MCV 95.4 78.0 - 100.0 fL   MCH 30.4 26.0 - 34.0 pg   MCHC 31.9 30.0 - 36.0 g/dL   RDW 13.3 11.5 - 15.5 %   Platelets 360 150 - 400 K/uL   Neutrophils Relative % 60 %   Neutro Abs 3.9 1.7 - 7.7 K/uL   Lymphocytes Relative 25 %   Lymphs Abs 1.6 0.7 - 4.0 K/uL   Monocytes Relative 10 %   Monocytes Absolute 0.7 0.1 - 1.0 K/uL   Eosinophils Relative 4 %   Eosinophils Absolute 0.3 0.0 - 0.7 K/uL   Basophils Relative 1 %   Basophils Absolute 0.1 0.0 - 0.1 K/uL  Sedimentation rate     Status: Abnormal   Collection Time: 03/23/17  3:05 AM  Result Value Ref Range   Sed Rate 125 (H) 0 - 22 mm/hr  C-reactive protein     Status: None   Collection Time: 03/23/17  3:46 AM  Result Value Ref Range   CRP <0.8 <1.0 mg/dL    Comment: Performed at [facility], [address] [place], [place] [address]  I-Stat CG4 Lactic Acid, ED  (not at  [facility])     Status: None   Collection Time: 03/23/17  3:59 AM  Result Value Ref Range   Lactic Acid, Venous 1.39 0.5 - 1.9 mmol/L  I-Stat CG4 Lactic Acid, ED  (not at  [facility])     Status: None   Collection Time: 03/23/17  6:08 AM  Result Value Ref Range   Lactic Acid, Venous 1.18 0.5 - 1.9 mmol/L  Urinalysis, Routine w reflex microscopic     Status: Abnormal   Collection Time: 03/23/17  6:40 AM  Result Value Ref Range   Color, Urine STRAW (A) YELLOW   APPearance CLEAR CLEAR  Specific Gravity, Urine 1.008 1.005 - 1.030   pH 8.0 5.0 - 8.0   Glucose, UA NEGATIVE NEGATIVE mg/dL   Hgb urine dipstick NEGATIVE NEGATIVE   Bilirubin Urine  NEGATIVE NEGATIVE   Ketones, ur NEGATIVE NEGATIVE mg/dL   Protein, ur NEGATIVE NEGATIVE mg/dL   Nitrite NEGATIVE NEGATIVE   Leukocytes, UA NEGATIVE NEGATIVE    Ct Pelvis W Contrast  Result Date: 03/23/2017 CLINICAL DATA:  Assess perirectal abscess. Status post debridement of abscess several weeks ago. Increasing drainage and pain. Fever. EXAM: CT PELVIS WITH CONTRAST TECHNIQUE: Multidetector CT imaging of the pelvis was performed using the standard protocol following the bolus administration of intravenous contrast. CONTRAST:  172m ISOVUE-300 IOPAMIDOL (ISOVUE-300) INJECTION 61% COMPARISON:  CT of the abdomen and pelvis performed 03/15/2017 FINDINGS: Urinary Tract: The bladder is mildly distended and grossly unremarkable. Bowel: Visualized small and large bowel loops are grossly unremarkable. Vascular/Lymphatic: No acute vascular abnormalities are seen. No retroperitoneal or pelvic sidewall lymphadenopathy is appreciated. Reproductive: The uterus is grossly unremarkable. No suspicious adnexal masses are seen. The ovaries are relatively symmetric. Other: The previously noted abscess overlying the sacrum and coccyx has largely resolved. However, there is increasing thin multiloculated fluid and air tracking along the soft tissues superficial to the right gluteus musculature, measuring up to 9 cm, reflecting abscess. Diffuse soft tissue edema is seen about the pelvis. Musculoskeletal: No acute osseous abnormalities are identified. The visualized musculature is unremarkable in appearance. IMPRESSION: 1. Previously noted abscess overlying the sacrum and coccyx is largely resolved. However, there is increasing thin multiloculated abscess tracking along the soft tissues superficial to the right gluteus musculature, measuring up to 9 cm in length. 2. Diffuse soft tissue edema about the pelvis. Electronically Signed   By: JGarald BaldingM.D.   On: 03/23/2017 05:56    Review of Systems  Constitutional: Positive  for fever and weight loss. Negative for chills.  Respiratory: Negative for cough and shortness of breath.   Cardiovascular: Negative for chest pain and palpitations.  Gastrointestinal: Positive for abdominal pain and diarrhea.  Genitourinary: Negative for dysuria and urgency.  Neurological: Negative for dizziness and headaches.   Blood pressure (!) 153/99, pulse 99, temperature 98.4 F (36.9 C), temperature source Rectal, resp. rate 17, last menstrual period 02/23/2017, SpO2 96 %, currently breastfeeding. Physical Exam  Constitutional: She is oriented to person, place, and time. She appears well-developed and well-nourished.  HENT:  Head: Normocephalic and atraumatic.  Eyes: Conjunctivae and EOM are normal. Pupils are equal, round, and reactive to light.  Neck: Normal range of motion. Neck supple.  Cardiovascular: Normal rate and regular rhythm.  Respiratory: Effort normal. No respiratory distress.  GI: Soft. There is no tenderness.  Musculoskeletal: Normal range of motion.  Neurological: She is alert and oriented to person, place, and time.  Skin: Skin is warm and dry.    Assessment/Plan: 26y.o. F with recent diagnosis of Crohn's disease currently untreated.  Pt with recurrent perirectal abscess now in R gluteus.  Unable to be drained at bedside.  I have recommended an EUA with possible seton placement as well as incision and drainage of her R gluteal fistula tract.   I also recommend starting treatment for her Crohn's disease while she is in the hospital as this is her 2nd abscess within 2 weeks.  She will continue to have trouble with this until she is put on immunosuppression.    Aira Sallade, Caroleena C. [id number]/11/[id number] 9:24 AM

## 2017-03-23 NOTE — ED Triage Notes (Addendum)
Patient here via EMS with complaints of hypoglycemia. States that CBG was 55. Given food and fluids. Diarrhea. States that she also has a wound on buttocks. Last CBG 157.

## 2017-03-23 NOTE — Progress Notes (Signed)
A consult was received from an ED physician for Vancomycin and Zosyn per pharmacy dosing.  The patient's profile has been reviewed for ht/wt/allergies/indication/available labs.   A one time order has been placed for Vancomycin 1gm iv x1,and Zosyn 3.375gm iv x1.  Further antibiotics/pharmacy consults should be ordered by admitting physician if indicated.                       Thank you, Aleene Davidson Crowford 03/23/2017  6:17 AM

## 2017-03-23 NOTE — H&P (Signed)
History and Physical    ARIAL GALLIGAN ZTI:[id number] DOB: Apr 13, 1990 DOA: 03/23/2017  PCP: [name], MD  Patient coming from:  home  Chief Complaint:  Buttock pain  HPI: [name] is a 26 y.o. female with medical history significant of DM, recurrent absess/cellulitis perirectal area, crohns just discharged 12/22 with dehydration and had debridement of perirectal absess 12/2 at that time culture grew out klebsiella pna sens to zosyn, she completed abx for that .  Pt reports she never got better from that.  Denies fevers.  She has persistent pain on her bottom and cannot say when it started getting worse again.  No n/v/d.  She has recently been diagnosed with crohns and has follow up with GI scheduled.  Pt found to have a new buttock absess.  Going to OR Today for drainage with surgery.  Referred for admission for buttock absess.  Review of Systems: As per HPI otherwise 10 point review of systems negative.   Past Medical History:  Diagnosis Date  . Anxiety   . Crohn's disease ([place])   . Diabetes type 1, uncontrolled ([place]) 11/14/2011   Since age 50  . Fibromyalgia   . Gastroparesis   . Hypertension   . Infection    UTI April 2016    Past Surgical History:  Procedure Laterality Date  . ANKLE SURGERY    . CHOLECYSTECTOMY  11/15/2011   Procedure: LAPAROSCOPIC CHOLECYSTECTOMY WITH INTRAOPERATIVE CHOLANGIOGRAM;  Surgeon: [name], MD;  Location: [facility];  Service: General;  Laterality: N/A;  . ESOPHAGOGASTRODUODENOSCOPY  12/03/2011   Procedure: ESOPHAGOGASTRODUODENOSCOPY (EGD);  Surgeon: Beryle Beams, MD;  Location: [facility];  Service: Endoscopy;  Laterality: N/A;  . FLEXIBLE SIGMOIDOSCOPY N/A 03/10/2017   Procedure: FLEXIBLE SIGMOIDOSCOPY;  Surgeon: Carol Ada, MD;  Location: [facility];  Service: Endoscopy;  Laterality: N/A;  . INCISION AND DRAINAGE PERIRECTAL ABSCESS N/A 03/01/2017   Procedure: IRRIGATION AND DEBRIDEMENT PERIRECTAL ABSCESS;  Surgeon: Alphonsa Overall, MD;  Location: [facility];  Service: General;  Laterality: N/A;  . LAPAROSCOPY  11/23/2011   Procedure: LAPAROSCOPY DIAGNOSTIC;  Surgeon: Edward Jolly, MD;  Location: [facility];  Service: General;  Laterality: N/A;     reports that  has never smoked. she has never used smokeless tobacco. She reports that she drinks alcohol. She reports that she does not use drugs.  Allergies  Allergen Reactions  . Peanut-Containing Drug Products Anaphylaxis and Other (See Comments)    Pt states that she is allergic to [place] nuts; can tolerate peanuts  . Lactose Intolerance (Gi) Other (See Comments)    Gi upset     Family History  Problem Relation Age of Onset  . Diabetes Mother   . Hypertension Father     Prior to Admission medications   Medication Sig Start Date End Date Taking? Authorizing Provider  cholestyramine light (PREVALITE) 4 g packet Take 1 packet (4 g total) by mouth 3 (three) times daily. 03/14/17  Yes Lavina Hamman, MD  citalopram (CELEXA) 20 MG tablet Take 1 tablet (20 mg total) by mouth daily. 03/15/17  Yes Lavina Hamman, MD  diphenoxylate-atropine (LOMOTIL) 2.5-0.025 MG tablet Take 2 tablets by mouth 4 (four) times daily. 03/14/17  Yes Lavina Hamman, MD  feeding supplement, GLUCERNA SHAKE, (GLUCERNA SHAKE) LIQD Take 237 mLs by mouth 2 (two) times daily between meals. 03/14/17  Yes Lavina Hamman, MD  ferrous sulfate 325 (65 FE) MG tablet Take 1 tablet (325 mg total) by mouth  2 (two) times daily with a meal. 03/14/17  Yes Lavina Hamman, MD  folic acid (FOLVITE) 1 MG tablet Take 1 tablet (1 mg total) by mouth daily. 03/15/17  Yes Lavina Hamman, MD  furosemide (LASIX) 40 MG tablet Take 1 tablet (40 mg total) by mouth daily. 03/21/17 04/20/17 Yes Aline August, MD  gabapentin (NEURONTIN) 300 MG capsule Take 1 capsule (300 mg total) by mouth 2 (two) times daily. 03/14/17  Yes Lavina Hamman, MD  Ibuprofen (MIDOL) 200 MG CAPS Take 400 mg by mouth 2 (two) times daily as  needed (pain).    Yes [provider]  insulin lispro (HUMALOG) 100 UNIT/ML injection BG 70 - 120: 0 units BG 121 - 150: 1 unit BG 151 - 200: 2 units BG 201 - 250: 3 units BG 251 - 300: 5 units BG 301 - 350: 7 units BG 351 - 400: 9 units Patient taking differently: Inject 1-9 Units into the skin 3 (three) times daily with meals. BG 70 - 120: 0 units BG 121 - 150: 1 unit BG 151 - 200: 2 units BG 201 - 250: 3 units BG 251 - 300: 5 units BG 301 - 350: 7 units BG 351 - 400: 9 units 03/14/17  Yes Lavina Hamman, MD  LANTUS 100 UNIT/ML injection Inject 10 Units into the skin 2 (two) times daily. 03/15/17  Yes [provider]  lipase/protease/amylase (CREON) 36000 UNITS CPEP capsule Take 1 capsule (36,000 Units total) by mouth 3 (three) times daily with meals. 03/14/17  Yes Lavina Hamman, MD  loperamide (IMODIUM) 2 MG capsule Take 1 capsule (2 mg total) by mouth 3 (three) times daily before meals. DO NOT TAKE IT FOR THE DAY YOU DONT HAVE A BOWEL MOVEMENT. 03/14/17  Yes Lavina Hamman, MD  methocarbamol (ROBAXIN) 500 MG tablet Take 1 tablet (500 mg total) by mouth every 8 (eight) hours as needed for muscle spasms. 03/14/17  Yes Lavina Hamman, MD  oxyCODONE-acetaminophen (PERCOCET/ROXICET) 5-325 MG tablet Take 1 tablet by mouth every 6 (six) hours as needed for severe pain. 03/21/17  Yes Aline August, MD  albuterol (PROVENTIL HFA;VENTOLIN HFA) 108 (90 Base) MCG/ACT inhaler Inhale 1-2 puffs into the lungs every 4 (four) hours as needed for wheezing or shortness of breath. Patient not taking: Reported on 03/15/2017 09/02/16   Street, [place], PA-C  EPINEPHrine (EPIPEN 2-PAK) 0.3 mg/0.3 mL IJ SOAJ injection Inject 0.3 mLs (0.3 mg total) into the muscle as needed (allergic reaction). 03/21/17   Aline August, MD  glucose 4 GM chewable tablet Chew 1 tablet (4 g total) by mouth as needed for low blood sugar. Patient not taking: Reported on 03/23/2017 08/10/14   Morene Crocker, CNM    traMADol (ULTRAM) 50 MG tablet Take 1 tablet (50 mg total) by mouth every 6 (six) hours as needed for moderate pain. Patient not taking: Reported on 03/23/2017 03/14/17   Lavina Hamman, MD    Physical Exam: Vitals:   03/23/17 0430 03/23/17 0437 03/23/17 0445 03/23/17 0641  BP:    (!) 153/99  Pulse: 99  96 99  Resp: 18  17 17   Temp:  98.4 F (36.9 C)    TempSrc:  Rectal    SpO2: 98%  93% 96%      Constitutional: NAD, calm, comfortable Vitals:   03/23/17 0430 03/23/17 0437 03/23/17 0445 03/23/17 0641  BP:    (!) 153/99  Pulse: 99  96 99  Resp: 18  17 17  Temp:  98.4 F (36.9 C)    TempSrc:  Rectal    SpO2: 98%  93% 96%   Eyes: PERRL, lids and conjunctivae normal ENMT: Mucous membranes are moist. Posterior pharynx clear of any exudate or lesions.Normal dentition.  Neck: normal, supple, no masses, no thyromegaly Respiratory: clear to auscultation bilaterally, no wheezing, no crackles. Normal respiratory effort. No accessory muscle use.  Cardiovascular: Regular rate and rhythm, no murmurs / rubs / gallops. No extremity edema. 2+ pedal pulses. No carotid bruits.  Abdomen: no tenderness, no masses palpated. No hepatosplenomegaly. Bowel sounds positive.  Musculoskeletal: no clubbing / cyanosis. No joint deformity upper and lower extremities. Good ROM, no contractures. Normal muscle tone.  Skin: right buttock absess Neurologic: CN 2-12 grossly intact. Sensation intact, DTR normal. Strength 5/5 in all 4.  Psychiatric: Normal judgment and insight. Alert and oriented x 3. Normal mood.    Labs on Admission: I have personally reviewed following labs and imaging studies  CBC: Recent Labs  Lab 03/16/17 2225 03/17/17 0411 03/18/17 0555 03/20/17 0421 03/21/17 0510 03/23/17 0305  WBC 5.6 5.6 5.7 6.2 5.4 6.5  NEUTROABS 2.8  --   --   --   --  3.9  HGB 8.8* 8.7* 9.2* 8.3* 8.3* 8.6*  HCT 27.6* 27.7* 30.0* 26.3* 26.6* 27.0*  MCV 95.2 95.8 95.8 95.6 95.0 95.4  PLT 417* 409* 411*  330 327 517   Basic Metabolic Panel: Recent Labs  Lab 03/18/17 0555 03/19/17 0401 03/20/17 0421 03/21/17 0510 03/23/17 0305  NA 138 139 140 136 136  K 4.0 4.1 4.4 4.5 4.0  CL 98* 102 101 96* 99*  CO2 31 34* 34* 34* 28  GLUCOSE 270* 177* 192* 170* 125*  BUN 8 10 12 13 12   CREATININE 0.46 0.43* 0.37* 0.41* 0.41*  CALCIUM 8.2* 8.0* 8.3* 8.5* 8.4*  MG  --  1.9  --   --   --    GFR: Estimated Creatinine Clearance: 102.7 mL/min (A) (by C-G formula based on SCr of 0.41 mg/dL (L)). Liver Function Tests: Recent Labs  Lab 03/16/17 2225 03/17/17 0411 03/23/17 0305  AST 40 44* 51*  ALT 33 31 20  ALKPHOS 156* 146* 99  BILITOT 0.2* 0.2* 0.6  PROT 7.0 6.8 7.1  ALBUMIN 1.8* 1.8* 2.2*   No results for input(s): LIPASE, AMYLASE in the last 168 hours. No results for input(s): AMMONIA in the last 168 hours. Coagulation Profile: No results for input(s): INR, PROTIME in the last 168 hours. Cardiac Enzymes: No results for input(s): CKTOTAL, CKMB, CKMBINDEX, TROPONINI in the last 168 hours. BNP (last 3 results) No results for input(s): PROBNP in the last 8760 hours. HbA1C: No results for input(s): HGBA1C in the last 72 hours. CBG: Recent Labs  Lab 03/21/17 1050 03/21/17 1111 03/21/17 1242 03/21/17 1800 03/23/17 0249  GLUCAP 98 113* 124* 365* 119*   Lipid Profile: No results for input(s): CHOL, HDL, LDLCALC, TRIG, CHOLHDL, LDLDIRECT in the last 72 hours. Thyroid Function Tests: No results for input(s): TSH, T4TOTAL, FREET4, T3FREE, THYROIDAB in the last 72 hours. Anemia Panel: No results for input(s): VITAMINB12, FOLATE, FERRITIN, TIBC, IRON, RETICCTPCT in the last 72 hours. Urine analysis:    Component Value Date/Time   COLORURINE STRAW (A) 03/23/2017 0640   APPEARANCEUR CLEAR 03/23/2017 0640   LABSPEC 1.008 03/23/2017 0640   PHURINE 8.0 03/23/2017 [place] 03/23/2017 0640   HGBUR NEGATIVE 03/23/2017 [place] NEGATIVE 03/23/2017 0017  BILIRUBINUR negative 01/02/2015 1717   [place] 03/23/2017 0640   PROTEINUR NEGATIVE 03/23/2017 0640   UROBILINOGEN 0.2 01/13/2015 0223   NITRITE NEGATIVE 03/23/2017 0640   LEUKOCYTESUR NEGATIVE 03/23/2017 0640   Sepsis Labs: !!!!!!!!!!!!!!!!!!!!!!!!!!!!!!!!!!!!!!!!!!!! @LABRCNTIP (procalcitonin:4,lacticidven:4) )No results found for this or any previous visit (from the past 240 hour(s)).   Radiological Exams on Admission: Ct Pelvis W Contrast  Result Date: 03/23/2017 CLINICAL DATA:  Assess perirectal abscess. Status post debridement of abscess several weeks ago. Increasing drainage and pain. Fever. EXAM: CT PELVIS WITH CONTRAST TECHNIQUE: Multidetector CT imaging of the pelvis was performed using the standard protocol following the bolus administration of intravenous contrast. CONTRAST:  184m ISOVUE-300 IOPAMIDOL (ISOVUE-300) INJECTION 61% COMPARISON:  CT of the abdomen and pelvis performed 03/15/2017 FINDINGS: Urinary Tract: The bladder is mildly distended and grossly unremarkable. Bowel: Visualized small and large bowel loops are grossly unremarkable. Vascular/Lymphatic: No acute vascular abnormalities are seen. No retroperitoneal or pelvic sidewall lymphadenopathy is appreciated. Reproductive: The uterus is grossly unremarkable. No suspicious adnexal masses are seen. The ovaries are relatively symmetric. Other: The previously noted abscess overlying the sacrum and coccyx has largely resolved. However, there is increasing thin multiloculated fluid and air tracking along the soft tissues superficial to the right gluteus musculature, measuring up to 9 cm, reflecting abscess. Diffuse soft tissue edema is seen about the pelvis. Musculoskeletal: No acute osseous abnormalities are identified. The visualized musculature is unremarkable in appearance. IMPRESSION: 1. Previously noted abscess overlying the sacrum and coccyx is largely resolved. However, there is increasing thin multiloculated  abscess tracking along the soft tissues superficial to the right gluteus musculature, measuring up to 9 cm in length. 2. Diffuse soft tissue edema about the pelvis. Electronically Signed   By: JGarald BaldingM.D.   On: 03/23/2017 05:56    Old chart reviewed Case discussed with edp  Assessment/Plan 26yo diabetic female with right buttock absess  Principal Problem:   Cellulitis and abscess of buttock- cont zosyn and vanc until culture data available.  Going to OR today.  Npo for now.  Not septic.  Active Problems:   Diabetes type 1, uncontrolled ([place]- cont lantus and place on ssi, diet per surgery post op   Hypertension associated with diabetes ([place]- cont home meds   Noncompliance with diabetes treatment- noted   MDD (major depressive disorder), recurrent episode, mild ([place]- noted  crohns - gi follow up   DVT prophylaxis:  scds Code Status:  full Family Communication:  husband Disposition Plan:  Per day team Consults called:   General surgery Admission status:  observation   Johanan Skorupski A MD [facility]  If 7PM-7AM, please contact night-coverage [email or web address] Password [email or web address] 03/23/2017, 8:54 AM

## 2017-03-23 NOTE — Op Note (Signed)
03/23/2017  93:90 AM  PATIENT:  [name]  26 y.o. female  Patient Care Team: [name], MD as PCP - General (Internal Medicine)  PRE-OPERATIVE DIAGNOSIS:  Gluteal abscess  POST-OPERATIVE DIAGNOSIS:  Gluteal abscess  PROCEDURE:   INCISION AND DRAINAGE RIGHT BUTTOCK, PENROSE DRAIN (SETON) PLACEMENT  SURGEON:  Surgeon(s): [name], MD  ASSISTANT: none   ANESTHESIA:   local and general  SPECIMEN:  No Specimen  COUNTS:  YES  PLAN OF CARE: Pt admitted  PATIENT DISPOSITION:  PACU - hemodynamically stable.  INDICATION: 26 y.o. F ~2 weeks s/p I&D of perirectal abscess.  She was then subsequently diagnosed with Crohn's disease but has not started treatment   OR FINDINGS: abscess fistulizing superiorly  DESCRIPTION: the patient was identified in the preoperative holding area and taken to the OR where they were laid on the operating room table.  General anesthesia was induced without difficulty. The patient was then positioned in prone jackknife position with buttocks gently taped apart.  The patient was then prepped and draped in usual sterile fashion.  SCDs were noted to be in place prior to the initiation of anesthesia. A surgical timeout was performed indicating the correct patient, procedure, positioning and need for preoperative antibiotics.  A rectal block was performed using Marcaine with epinephrine.    I began with a digital rectal exam.  There were no masses.  I then placed a Hill-Ferguson anoscope into the anal canal and evaluated this completely.  There was no sign of active inflammation.  THe abscess cavity was palpated and tracking into the tissue overlying the R gluteus.  A small counter incision was made and a penrose was placed through the fistula tract and secured into place with 2-0 silk sutures.

## 2017-03-23 NOTE — ED Notes (Signed)
Bed: WA25 Expected date:  Expected time:  Means of arrival:  Comments: Ems- hypoglycemia 

## 2017-03-23 NOTE — Progress Notes (Signed)
Pharmacy Antibiotic Note  [name] is a 26 y.o. female admitted on 03/23/2017 with cellulitis, recurrent buttock abscess.  Pharmacy has been consulted for Vancomycin & Zosyn dosing. Hx of recurrent perirectal abscess/cellulitis Last perirectal I/D was 12/2 (Kleb pna), last admit 12/27 for dehydration, total body edema, low albumin, no ascites  Plan: Vancomycin 1000 mg IV every 12 hours.  Goal trough 10-15 mcg/mL. Zosyn 3.375g IV q8h (4 hour infusion).  Daily serum creatinine Anticipate Gram neg cx results, narrow abx soon     Temp (24hrs), Avg:98.4 F (36.9 C), Min:98.4 F (36.9 C), Max:98.4 F (36.9 C)  Recent Labs  Lab 03/16/17 2236 03/17/17 0411 03/18/17 0555 03/19/17 0401 03/20/17 0421 03/21/17 0510 03/23/17 0305 03/23/17 0359 03/23/17 0608  WBC  --  5.6 5.7  --  6.2 5.4 6.5  --   --   CREATININE  --  0.34* 0.46 0.43* 0.37* 0.41* 0.41*  --   --   LATICACIDVEN 1.69  --   --   --   --   --   --  1.39 1.18    Estimated Creatinine Clearance: 102.7 mL/min (A) (by C-G formula based on SCr of 0.41 mg/dL (L)).    Allergies  Allergen Reactions  . Peanut-Containing Drug Products Anaphylaxis and Other (See Comments)    Pt states that she is allergic to [place] nuts; can tolerate peanuts  . Lactose Intolerance (Gi) Other (See Comments)    Gi upset    Antimicrobials this admission: 12/24 Zosyn >>  12/24 Vancomycin  >>   Dose adjustments this admission:  Microbiology results: 12/24 BCx: sent 12/24 Abscess cx: sent  Prev Abscess Cx 12/2: Klebsiella pna, res only to Amp/Unasyn, WAS sens to Zosyn  Thank you for allowing pharmacy to be a part of this patient's care.  Otho Bellows PharmD Pager [phone] 03/23/2017, 9:45 AM

## 2017-03-24 ENCOUNTER — Encounter (HOSPITAL_COMMUNITY): Payer: Self-pay | Admitting: General Surgery

## 2017-03-24 DIAGNOSIS — Z833 Family history of diabetes mellitus: Secondary | ICD-10-CM | POA: Diagnosis not present

## 2017-03-24 DIAGNOSIS — F419 Anxiety disorder, unspecified: Secondary | ICD-10-CM | POA: Diagnosis present

## 2017-03-24 DIAGNOSIS — Y838 Other surgical procedures as the cause of abnormal reaction of the patient, or of later complication, without mention of misadventure at the time of the procedure: Secondary | ICD-10-CM | POA: Diagnosis present

## 2017-03-24 DIAGNOSIS — L0231 Cutaneous abscess of buttock: Secondary | ICD-10-CM | POA: Diagnosis present

## 2017-03-24 DIAGNOSIS — Z6825 Body mass index (BMI) 25.0-25.9, adult: Secondary | ICD-10-CM | POA: Diagnosis not present

## 2017-03-24 DIAGNOSIS — Z9119 Patient's noncompliance with other medical treatment and regimen: Secondary | ICD-10-CM | POA: Diagnosis not present

## 2017-03-24 DIAGNOSIS — L03317 Cellulitis of buttock: Secondary | ICD-10-CM | POA: Diagnosis present

## 2017-03-24 DIAGNOSIS — E1065 Type 1 diabetes mellitus with hyperglycemia: Secondary | ICD-10-CM | POA: Diagnosis present

## 2017-03-24 DIAGNOSIS — T8141XA Infection following a procedure, superficial incisional surgical site, initial encounter: Secondary | ICD-10-CM | POA: Diagnosis present

## 2017-03-24 DIAGNOSIS — M797 Fibromyalgia: Secondary | ICD-10-CM | POA: Diagnosis present

## 2017-03-24 DIAGNOSIS — E440 Moderate protein-calorie malnutrition: Secondary | ICD-10-CM | POA: Diagnosis present

## 2017-03-24 DIAGNOSIS — E1069 Type 1 diabetes mellitus with other specified complication: Secondary | ICD-10-CM | POA: Diagnosis present

## 2017-03-24 DIAGNOSIS — Z8249 Family history of ischemic heart disease and other diseases of the circulatory system: Secondary | ICD-10-CM | POA: Diagnosis not present

## 2017-03-24 DIAGNOSIS — F1120 Opioid dependence, uncomplicated: Secondary | ICD-10-CM | POA: Diagnosis present

## 2017-03-24 DIAGNOSIS — I152 Hypertension secondary to endocrine disorders: Secondary | ICD-10-CM | POA: Diagnosis present

## 2017-03-24 DIAGNOSIS — E10628 Type 1 diabetes mellitus with other skin complications: Secondary | ICD-10-CM | POA: Diagnosis present

## 2017-03-24 DIAGNOSIS — K3184 Gastroparesis: Secondary | ICD-10-CM | POA: Diagnosis present

## 2017-03-24 DIAGNOSIS — Z794 Long term (current) use of insulin: Secondary | ICD-10-CM | POA: Diagnosis not present

## 2017-03-24 DIAGNOSIS — Z79899 Other long term (current) drug therapy: Secondary | ICD-10-CM | POA: Diagnosis not present

## 2017-03-24 DIAGNOSIS — Z91011 Allergy to milk products: Secondary | ICD-10-CM | POA: Diagnosis not present

## 2017-03-24 DIAGNOSIS — Z9049 Acquired absence of other specified parts of digestive tract: Secondary | ICD-10-CM | POA: Diagnosis not present

## 2017-03-24 DIAGNOSIS — E1040 Type 1 diabetes mellitus with diabetic neuropathy, unspecified: Secondary | ICD-10-CM | POA: Diagnosis present

## 2017-03-24 DIAGNOSIS — E1043 Type 1 diabetes mellitus with diabetic autonomic (poly)neuropathy: Secondary | ICD-10-CM | POA: Diagnosis present

## 2017-03-24 DIAGNOSIS — F330 Major depressive disorder, recurrent, mild: Secondary | ICD-10-CM | POA: Diagnosis present

## 2017-03-24 DIAGNOSIS — K5090 Crohn's disease, unspecified, without complications: Secondary | ICD-10-CM | POA: Diagnosis present

## 2017-03-24 LAB — BASIC METABOLIC PANEL
Anion gap: 6 (ref 5–15)
BUN: 10 mg/dL (ref 6–20)
CO2: 27 mmol/L (ref 22–32)
Calcium: 8.3 mg/dL — ABNORMAL LOW (ref 8.9–10.3)
Chloride: 105 mmol/L (ref 101–111)
Creatinine, Ser: 0.35 mg/dL — ABNORMAL LOW (ref 0.44–1.00)
GFR calc Af Amer: >60 mL/min (ref >60)
GFR calc non Af Amer: >60 mL/min (ref >60)
Glucose, Bld: 157 mg/dL — ABNORMAL HIGH (ref 65–99)
Potassium: 4 mmol/L (ref 3.5–5.1)
Sodium: 138 mmol/L (ref 135–145)

## 2017-03-24 LAB — CBC
HCT: 27.3 % — ABNORMAL LOW (ref 36.0–46.0)
Hemoglobin: 8.6 g/dL — ABNORMAL LOW (ref 12.0–15.0)
MCH: 29.8 pg (ref 26.0–34.0)
MCHC: 31.5 g/dL (ref 30.0–36.0)
MCV: 94.5 fL (ref 78.0–100.0)
Platelets: 275 10*3/uL (ref 150–400)
RBC: 2.89 MIL/uL — ABNORMAL LOW (ref 3.87–5.11)
RDW: 13.3 % (ref 11.5–15.5)
WBC: 5.4 10*3/uL (ref 4.0–10.5)

## 2017-03-24 LAB — GLUCOSE, CAPILLARY
Glucose-Capillary: 100 mg/dL — ABNORMAL HIGH (ref 65–99)
Glucose-Capillary: 121 mg/dL — ABNORMAL HIGH (ref 65–99)
Glucose-Capillary: 145 mg/dL — ABNORMAL HIGH (ref 65–99)
Glucose-Capillary: 180 mg/dL — ABNORMAL HIGH (ref 65–99)

## 2017-03-24 MED ORDER — OXYCODONE-ACETAMINOPHEN 5-325 MG PO TABS
1.0000 | ORAL_TABLET | ORAL | Status: DC | PRN
  Administered 2017-03-24 (×2): 1 via ORAL
  Filled 2017-03-24 (×2): qty 1

## 2017-03-24 MED ORDER — SULFAMETHOXAZOLE-TRIMETHOPRIM 800-160 MG PO TABS: 1.0000 | ORAL_TABLET | Freq: Two times a day (BID) | ORAL | 0 refills | Status: AC

## 2017-03-24 MED ORDER — MORPHINE SULFATE (PF) 2 MG/ML IV SOLN
2.0000 mg | INTRAVENOUS | Status: AC
  Administered 2017-03-24: 2 mg via INTRAVENOUS
  Filled 2017-03-24: qty 1

## 2017-03-24 NOTE — Progress Notes (Signed)
Paged Dr.Devine in regards to no orders to leave or remove penrose drain at discharge. Dr.Devine told me to contact Dr.Thomas or on-call surgeon. I left a message for On-call Surgeon with [facility] answering service.

## 2017-03-24 NOTE — Progress Notes (Signed)
Spoke with on-call [place] surgery MD who stated to leave penrose drain in place at discharge and to have patient to follow-up in 1 week with Dr.Thomas or Dr.Newman

## 2017-03-24 NOTE — Progress Notes (Addendum)
Patient ID: [name], female   DOB: 1991/02/04, 26 y.o.   MRN: [id number]  PROGRESS NOTE    [name]  HYQ:[id number] DOB: 1991/02/04 DOA: 03/23/2017  PCP: Andi DevonShelton, Kimberly, MD   Brief Narrative:  26 year old female with history of chronic diarrhea, type 1 diabetes mellitus, perirectal abscess requiring operative debridementon 12/2.  Pt presented with low blood glucose and fevers.. She also had increased pain at the site of the incision wound. She underwent I&D of the gluteal abscess/cellulitis 12/24.   Assessment & Plan:   Principal Problem:   Cellulitis and abscess of buttock - S/P I&D done by Dr. Romie LeveeAlicia Thomas 12/24 - Continue vanco and zosyn - Continue supportive care with IV fluids, analgesia as needed - Follow up culture results  Active Problems:   Diabetes type 1, uncontrolled with diabetic neuropathy (HCC) - Continue current insulin regimen: Lantus 10 units BID and Novolog SSI - Continue gabapentin for neuropathy     MDD (major depressive disorder), recurrent episode, mild (HCC) - Continue Citalopram  DVT prophylaxis: SCD's Code Status: full code  Family Communication: husband at the bedside  Disposition Plan: home once cleared by surgery    Consultants:   Surgery, Dr. Romie LeveeAlicia Thomas   Procedures:   IRRIGATION AND DEBRIDEMENT BUTTOCKS, SETON PLACEMENT 12/24    Antimicrobials:   Vanco and zosyn 12/24 -->   Subjective: No overnight events.  Objective: Vitals:   03/23/17 1258 03/23/17 2008 03/24/17 0016 03/24/17 0415  BP: (!) 155/100 (!) 144/86 (!) 150/88 136/86  Pulse: 100 (!) 107 (!) 101 93  Resp: 14 18 18 18   Temp: 98.8 F (37.1 C) 98.6 F (37 C) 99 F (37.2 C) 98.2 F (36.8 C)  TempSrc:  Oral Oral Oral  SpO2: 98% 98% 98% 99%    Intake/Output Summary (Last 24 hours) at 03/24/2017 1027 Last data filed at 03/24/2017 1000 Gross per 24 hour  Intake 2953.75 ml  Output 3210 ml  Net -256.25 ml   There were no vitals filed for this  visit.  Examination:  General exam: Appears calm and comfortable  Respiratory system: Clear to auscultation. Respiratory effort normal. Cardiovascular system: S1 & S2 heard, RRR. Gastrointestinal system: Abdomen is nondistended, soft and nontender. No organomegaly or masses felt. Normal bowel sounds heard. Central nervous system: Alert and oriented. No focal neurological deficits. Extremities: Symmetric 5 x 5 power. Skin: warm, dry Psychiatry: Judgement and insight appear normal. Mood & affect appropriate.   Data Reviewed: I have personally reviewed following labs and imaging studies  CBC: Recent Labs  Lab 03/18/17 0555 03/20/17 0421 03/21/17 0510 03/23/17 0305 03/24/17 0447  WBC 5.7 6.2 5.4 6.5 5.4  NEUTROABS  --   --   --  3.9  --   HGB 9.2* 8.3* 8.3* 8.6* 8.6*  HCT 30.0* 26.3* 26.6* 27.0* 27.3*  MCV 95.8 95.6 95.0 95.4 94.5  PLT 411* 330 327 360 275   Basic Metabolic Panel: Recent Labs  Lab 03/19/17 0401 03/20/17 0421 03/21/17 0510 03/23/17 0305 03/24/17 0447  NA 139 140 136 136 138  K 4.1 4.4 4.5 4.0 4.0  CL 102 101 96* 99* 105  CO2 34* 34* 34* 28 27  GLUCOSE 177* 192* 170* 125* 157*  BUN 10 12 13 12 10   CREATININE 0.43* 0.37* 0.41* 0.41* 0.35*  CALCIUM 8.0* 8.3* 8.5* 8.4* 8.3*  MG 1.9  --   --   --   --    GFR: Estimated Creatinine Clearance: 102.7 mL/min (A) (by C-G  formula based on SCr of 0.35 mg/dL (L)). Liver Function Tests: Recent Labs  Lab 03/23/17 0305  AST 51*  ALT 20  ALKPHOS 99  BILITOT 0.6  PROT 7.1  ALBUMIN 2.2*   No results for input(s): LIPASE, AMYLASE in the last 168 hours. No results for input(s): AMMONIA in the last 168 hours. Coagulation Profile: No results for input(s): INR, PROTIME in the last 168 hours. Cardiac Enzymes: No results for input(s): CKTOTAL, CKMB, CKMBINDEX, TROPONINI in the last 168 hours. BNP (last 3 results) No results for input(s): PROBNP in the last 8760 hours. HbA1C: No results for input(s): HGBA1C in  the last 72 hours. CBG: Recent Labs  Lab 03/23/17 1731 03/23/17 2004 03/24/17 0010 03/24/17 0411 03/24/17 0759  GLUCAP 233* 330* 180* 145* 100*   Lipid Profile: No results for input(s): CHOL, HDL, LDLCALC, TRIG, CHOLHDL, LDLDIRECT in the last 72 hours. Thyroid Function Tests: No results for input(s): TSH, T4TOTAL, FREET4, T3FREE, THYROIDAB in the last 72 hours. Anemia Panel: No results for input(s): VITAMINB12, FOLATE, FERRITIN, TIBC, IRON, RETICCTPCT in the last 72 hours. Urine analysis:    Component Value Date/Time   COLORURINE STRAW (A) 03/23/2017 0640   APPEARANCEUR CLEAR 03/23/2017 0640   LABSPEC 1.008 03/23/2017 0640   PHURINE 8.0 03/23/2017 0640   GLUCOSEU NEGATIVE 03/23/2017 0640   HGBUR NEGATIVE 03/23/2017 0640   BILIRUBINUR NEGATIVE 03/23/2017 0640   BILIRUBINUR negative 01/02/2015 1717   KETONESUR NEGATIVE 03/23/2017 0640   PROTEINUR NEGATIVE 03/23/2017 0640   UROBILINOGEN 0.2 01/13/2015 0223   NITRITE NEGATIVE 03/23/2017 0640   LEUKOCYTESUR NEGATIVE 03/23/2017 0640   Sepsis Labs: @LABRCNTIP (procalcitonin:4,lacticidven:4)   Recent Results (from the past 240 hour(s))  Wound or Superficial Culture     Status: None (Preliminary result)   Collection Time: 03/23/17  3:07 AM  Result Value Ref Range Status   Specimen Description WOUND ULCLER  Final   Special Requests Immunocompromised  Final   Gram Stain   Final    MODERATE WBC PRESENT, PREDOMINANTLY PMN FEW GRAM NEGATIVE RODS RARE GRAM POSITIVE COCCI IN PAIRS Performed at [facility], [address], [place], [place] [address]    Culture PENDING  Incomplete   Report Status PENDING  Incomplete      Radiology Studies: Ct Pelvis W Contrast  Result Date: 03/23/2017 CLINICAL DATA:  Assess perirectal abscess. Status post debridement of abscess several weeks ago. Increasing drainage and pain. Fever. EXAM: CT PELVIS WITH CONTRAST TECHNIQUE: Multidetector CT imaging of the pelvis was performed using the  standard protocol following the bolus administration of intravenous contrast. CONTRAST:  ISOVUE-300 IOPAMIDOL (ISOVUE-300) INJECTION 61% COMPARISON:  CT of the abdomen and pelvis performed 03/15/2017 FINDINGS: Urinary Tract: The bladder is mildly distended and grossly unremarkable. Bowel: Visualized small and large bowel loops are grossly unremarkable. Vascular/Lymphatic: No acute vascular abnormalities are seen. No retroperitoneal or pelvic sidewall lymphadenopathy is appreciated. Reproductive: The uterus is grossly unremarkable. No suspicious adnexal masses are seen. The ovaries are relatively symmetric. Other: The previously noted abscess overlying the sacrum and coccyx has largely resolved. However, there is increasing thin multiloculated fluid and air tracking along the soft tissues superficial to the right gluteus musculature, measuring up to 9 cm, reflecting abscess. Diffuse soft tissue edema is seen about the pelvis. Musculoskeletal: No acute osseous abnormalities are identified. The visualized musculature is unremarkable in appearance. IMPRESSION: 1. Previously noted abscess overlying the sacrum and coccyx is largely resolved. However, there is increasing thin multiloculated abscess tracking along the soft  tissues superficial to the right gluteus musculature, measuring up to 9 cm in length. 2. Diffuse soft tissue edema about the pelvis. Electronically Signed   By: Roanna Raider M.D.   On: 03/23/2017 05:56        Scheduled Meds: . cholestyramine light  4 g Oral TID  . citalopram  20 mg Oral Daily  . diphenoxylate-atropine  2 tablet Oral QID  . feeding supplement (GLUCERNA SHAKE)  237 mL Oral BID BM  . ferrous sulfate  325 mg Oral BID WC  . folic acid  1 mg Oral Daily  . gabapentin  300 mg Oral BID  . insulin aspart  0-9 Units Subcutaneous Q4H  . insulin glargine  10 Units Subcutaneous BID  . lipase/protease/amylase  36,000 Units Oral TID WC  . loperamide  2 mg Oral TID AC    Continuous Infusions: . piperacillin-tazobactam (ZOSYN)  IV Stopped (03/24/17 4098)  . sodium chloride    . vancomycin Stopped (03/24/17 0208)     LOS: 0 days    Time spent: 25 minutes  Greater than 50% of the time spent on counseling and coordinating the care.   Manson Passey, MD [facility] Pager [phone]  If 7PM-7AM, please contact night-coverage [email or web address] Password [facility] 03/24/2017, 10:27 AM

## 2017-03-24 NOTE — Progress Notes (Signed)
Paged Dr.Devine, patient was asking if it would be possible to be discharged home today, stating the Dr.Thomas said she was clear from surgery stand point to go home.

## 2017-03-24 NOTE — Discharge Instructions (Addendum)
Incision and Drainage Incision and drainage is a surgical procedure to open and drain a fluid-filled sac. The sac may be filled with pus, mucus, or blood. Examples of fluid-filled sacs that may need surgical drainage include cysts, skin infections (abscesses), and red lumps that develop from a ruptured cyst or a small abscess (boils). You may need this procedure if the affected area is large, painful, infected, or not healing well. Tell a health care provider about:  Any allergies you have.  All medicines you are taking, including vitamins, herbs, eye drops, creams, and over-the-counter medicines.  Any problems you or family members have had with anesthetic medicines.  Any blood disorders you have.  Any surgeries you have had.  Any medical conditions you have.  Whether you are pregnant or may be pregnant. What are the risks? Generally, this is a safe procedure. However, problems may occur, including:  Infection.  Bleeding.  Allergic reactions to medicines.  Scarring.  What happens before the procedure?  You may need an ultrasound or other imaging tests to see how large or deep the fluid-filled sac is.  You may have blood tests to check for infection.  You may get a tetanus shot.  You may be given antibiotic medicine to help prevent infection.  Follow instructions from your health care provider about eating or drinking restrictions.  Ask your health care provider about: ? Changing or stopping your regular medicines. This is especially important if you are taking diabetes medicines or blood thinners. ? Taking medicines such as aspirin and ibuprofen. These medicines can thin your blood. Do not take these medicines before your procedure if your health care provider instructs you not to.  Plan to have someone take you home after the procedure.  If you will be going home right after the procedure, plan to have someone stay with you for 24 hours. What happens during the  procedure?  To reduce your risk of infection: ? Your health care team will wash or sanitize their hands. ? Your skin will be washed with soap.  You will be given one or more of the following: ? A medicine to help you relax (sedative). ? A medicine to numb the area (local anesthetic). ? A medicine to make you fall asleep (general anesthetic).  An incision will be made in the top of the fluid-filled sac.  The contents of the sac may be squeezed out, or a syringe or tube (catheter)may be used to empty the sac.  The catheter may be left in place for several weeks to drain any fluid. Or, your health care provider may stitch open the edges of the incision to make a long-term opening for drainage (marsupialization).  The inside of the sac may be washed out (irrigated) with a sterile solution and packed with gauze before it is covered with a bandage (dressing). The procedure may vary among health care providers and hospitals. What happens after the procedure?  Your blood pressure, heart rate, breathing rate, and blood oxygen level will be monitored often until the medicines you were given have worn off.  Do not drive for 24 hours if you received a sedative. This information is not intended to replace advice given to you by your health care provider. Make sure you discuss any questions you have with your health care provider. Document Released: 09/10/2000 Document Revised: 08/23/2015 Document Reviewed: 01/05/2015 Elsevier Interactive Patient Education  2018 [employer]  

## 2017-03-24 NOTE — Discharge Summary (Addendum)
Physician Discharge Summary  [name] JWJ:[id number] DOB: [birth year] DOA: 03/23/2017  PCP: [name], MD  Admit date: 03/23/2017 Discharge date: 03/24/2017  Recommendations for Outpatient Follow-up:  1. Continue Bactrim for 7 days on discharge   Discharge Diagnoses:  Principal Problem:   Cellulitis and abscess of buttock Active Problems:   Diabetes type 1, uncontrolled ([place])   Hypertension associated with diabetes ([place])   Noncompliance with diabetes treatment   MDD (major depressive disorder), recurrent episode, mild ([facility])   Abscess and cellulitis of gluteal region    Discharge Condition: stable   Diet recommendation: as tolerated   History of present illness:  26 year old female with history of chronic diarrhea, type 1 diabetes mellitus, perirectal abscess requiring operative debridementon 12/2.  Pt presented with low blood glucose and fevers.. She also had increased pain at the site of the incision wound. She underwent I&D of the gluteal abscess/cellulitis 12/24.   Hospital Course:   Assessment & Plan:   Principal Problem:   Cellulitis and abscess of buttock related to diabetes mellitus  - S/P I&D done by Dr. [name] 62/13 - Per surgery, ok for discharge - Continue bactrim for 7 days on d/c  Active Problems:   Diabetes type 1, uncontrolled with diabetic neuropathy ([place]) - Continue home insulin regimen  - Continue gabapentin for neuropathy     MDD (major depressive disorder), recurrent episode, mild ([place]) - Continue Citalopram  DVT prophylaxis: SCD's Code Status: full code  Family Communication: husband at the bedside     Consultants:   Surgery, Dr. [name]   Procedures:   IRRIGATION AND DEBRIDEMENT BUTTOCKS, SETON PLACEMENT 12/24    Antimicrobials:   Vanco and zosyn 12/24 -->      Signed:  [name], MD  [facility] 03/24/2017, 12:56 PM  Pager #: [phone]  Time spent in minutes: more  than 30 minutes   Discharge Exam: Vitals:   03/24/17 0016 03/24/17 0415  BP: (!) 150/88 136/86  Pulse: (!) 101 93  Resp: 18 18  Temp: 99 F (37.2 C) 98.2 F (36.8 C)  SpO2: 98% 99%   Vitals:   03/23/17 1258 03/23/17 2008 03/24/17 0016 03/24/17 0415  BP: (!) 155/100 (!) 144/86 (!) 150/88 136/86  Pulse: 100 (!) 107 (!) 101 93  Resp: 14 18 18 18   Temp: 98.8 F (37.1 C) 98.6 F (37 C) 99 F (37.2 C) 98.2 F (36.8 C)  TempSrc:  Oral Oral Oral  SpO2: 98% 98% 98% 99%    General: Pt is alert, follows commands appropriately, not in acute distress Cardiovascular: Regular rate and rhythm, S1/S2 +, no murmurs Respiratory: Clear to auscultation bilaterally, no wheezing, no crackles, no rhonchi Abdominal: Soft, non tender, non distended, bowel sounds +, no guarding Extremities: no edema, no cyanosis, pulses palpable bilaterally DP and PT Neuro: Grossly nonfocal  Discharge Instructions  Discharge Instructions    Call MD for:  persistant nausea and vomiting   Complete by:  As directed    Call MD for:  redness, tenderness, or signs of infection (pain, swelling, redness, odor or green/yellow discharge around incision site)   Complete by:  As directed    Call MD for:  severe uncontrolled pain   Complete by:  As directed    Diet - low sodium heart healthy   Complete by:  As directed    Discharge instructions   Complete by:  As directed    Continue Bactrim on discharge for 7 days on discharge  Increase activity slowly   Complete by:  As directed      Allergies as of 03/24/2017      Reactions   Peanut-containing Drug Products Anaphylaxis, Other (See Comments)   Pt states that she is allergic to [place] nuts; can tolerate peanuts   Lactose Intolerance (gi) Other (See Comments)   Gi upset       Medication List    STOP taking these medications   traMADol 50 MG tablet Commonly known as:  ULTRAM     TAKE these medications   albuterol 108 (90 Base) MCG/ACT inhaler Commonly  known as:  PROVENTIL HFA;VENTOLIN HFA Inhale 1-2 puffs into the lungs every 4 (four) hours as needed for wheezing or shortness of breath.   cholestyramine light 4 g packet Commonly known as:  PREVALITE Take 1 packet (4 g total) by mouth 3 (three) times daily.   citalopram 20 MG tablet Commonly known as:  CELEXA Take 1 tablet (20 mg total) by mouth daily.   diphenoxylate-atropine 2.5-0.025 MG tablet Commonly known as:  LOMOTIL Take 2 tablets by mouth 4 (four) times daily.   EPINEPHrine 0.3 mg/0.3 mL Soaj injection Commonly known as:  EPIPEN 2-PAK Inject 0.3 mLs (0.3 mg total) into the muscle as needed (allergic reaction).   feeding supplement (GLUCERNA SHAKE) Liqd Take 237 mLs by mouth 2 (two) times daily between meals.   ferrous sulfate 325 (65 FE) MG tablet Take 1 tablet (325 mg total) by mouth 2 (two) times daily with a meal.   folic acid 1 MG tablet Commonly known as:  FOLVITE Take 1 tablet (1 mg total) by mouth daily.   furosemide 40 MG tablet Commonly known as:  LASIX Take 1 tablet (40 mg total) by mouth daily.   gabapentin 300 MG capsule Commonly known as:  NEURONTIN Take 1 capsule (300 mg total) by mouth 2 (two) times daily.   glucose 4 GM chewable tablet Chew 1 tablet (4 g total) by mouth as needed for low blood sugar.   insulin lispro 100 UNIT/ML injection Commonly known as:  HUMALOG BG 70 - 120: 0 units BG 121 - 150: 1 unit BG 151 - 200: 2 units BG 201 - 250: 3 units BG 251 - 300: 5 units BG 301 - 350: 7 units BG 351 - 400: 9 units What changed:    how much to take  how to take this  when to take this  additional instructions   LANTUS 100 UNIT/ML injection Generic drug:  insulin glargine Inject 10 Units into the skin 2 (two) times daily.   lipase/protease/amylase 36000 UNITS Cpep capsule Commonly known as:  CREON Take 1 capsule (36,000 Units total) by mouth 3 (three) times daily with meals.   loperamide 2 MG capsule Commonly known as:   IMODIUM Take 1 capsule (2 mg total) by mouth 3 (three) times daily before meals. DO NOT TAKE IT FOR THE DAY YOU DONT HAVE A BOWEL MOVEMENT.   methocarbamol 500 MG tablet Commonly known as:  ROBAXIN Take 1 tablet (500 mg total) by mouth every 8 (eight) hours as needed for muscle spasms.   MIDOL 200 MG Caps Generic drug:  Ibuprofen Take 400 mg by mouth 2 (two) times daily as needed (pain).   oxyCODONE-acetaminophen 5-325 MG tablet Commonly known as:  PERCOCET/ROXICET Take 1 tablet by mouth every 6 (six) hours as needed for severe pain.   sulfamethoxazole-trimethoprim 800-160 MG tablet Commonly known as:  BACTRIM DS,SEPTRA DS Take 1 tablet by mouth  2 (two) times daily for 7 days.      Follow-up Information    [name], MD. Schedule an appointment as soon as possible for a visit in 1 week(s).   Specialty:  Internal Medicine Contact information: [address] [place] [address] [phone]            The results of significant diagnostics from this hospitalization (including imaging, microbiology, ancillary and laboratory) are listed below for reference.    Significant Diagnostic Studies: Ct Pelvis W Contrast  Result Date: 03/23/2017 CLINICAL DATA:  Assess perirectal abscess. Status post debridement of abscess several weeks ago. Increasing drainage and pain. Fever. EXAM: CT PELVIS WITH CONTRAST TECHNIQUE: Multidetector CT imaging of the pelvis was performed using the standard protocol following the bolus administration of intravenous contrast. CONTRAST:  137m ISOVUE-300 IOPAMIDOL (ISOVUE-300) INJECTION 61% COMPARISON:  CT of the abdomen and pelvis performed 03/15/2017 FINDINGS: Urinary Tract: The bladder is mildly distended and grossly unremarkable. Bowel: Visualized small and large bowel loops are grossly unremarkable. Vascular/Lymphatic: No acute vascular abnormalities are seen. No retroperitoneal or pelvic sidewall lymphadenopathy is appreciated.  Reproductive: The uterus is grossly unremarkable. No suspicious adnexal masses are seen. The ovaries are relatively symmetric. Other: The previously noted abscess overlying the sacrum and coccyx has largely resolved. However, there is increasing thin multiloculated fluid and air tracking along the soft tissues superficial to the right gluteus musculature, measuring up to 9 cm, reflecting abscess. Diffuse soft tissue edema is seen about the pelvis. Musculoskeletal: No acute osseous abnormalities are identified. The visualized musculature is unremarkable in appearance. IMPRESSION: 1. Previously noted abscess overlying the sacrum and coccyx is largely resolved. However, there is increasing thin multiloculated abscess tracking along the soft tissues superficial to the right gluteus musculature, measuring up to 9 cm in length. 2. Diffuse soft tissue edema about the pelvis. Electronically Signed   By: JGarald BaldingM.D.   On: 03/23/2017 05:56   Ct Abdomen Pelvis W Contrast  Result Date: 03/15/2017 CLINICAL DATA:  Vomiting, diarrhea, weakness. Recent hospitalization with DKA and buttock abscess treatment. EXAM: CT ABDOMEN AND PELVIS WITH CONTRAST TECHNIQUE: Multidetector CT imaging of the abdomen and pelvis was performed using the standard protocol following bolus administration of intravenous contrast. CONTRAST:  1071mISOVUE-300 IOPAMIDOL (ISOVUE-300) INJECTION 61% COMPARISON:  02/28/2017 FINDINGS: Lower chest: Bilateral pleural effusions, slightly enlarged from before. Bibasilar subsegmental atelectasis. Hepatobiliary: No focal liver abnormality is seen. Status post cholecystectomy. No biliary dilatation. Pancreas: Atrophic appearance. Spleen: Normal in size without focal abnormality. Adrenals/Urinary Tract: Adrenal glands are unremarkable. Kidneys are normal, without renal calculi, focal lesion, or hydronephrosis. Bladder is unremarkable. Stomach/Bowel: Stomach is within normal limits. No evidence of  appendicitis. No evidence of bowel wall thickening, distention, or inflammatory changes. Vascular/Lymphatic: No significant vascular findings are present. No enlarged abdominal or pelvic lymph nodes. Reproductive: Uterus and bilateral adnexa are unremarkable. Other: Diffuse abdominal wall edema. Improved appearance of perirectal and right buttock abscess. Small amount of phlegmonous material and gas remain in the right buttock and intragluteal fold. No drainable collections seen. Musculoskeletal: No acute or significant osseous findings. IMPRESSION: Increased bilateral pleural effusions with associated subsegmental atelectasis. Persistent moderate abdominopelvic ascites. Diffuse abdominal wall edema. Improved appearance of perirectal and right buttock abscess. Electronically Signed   By: DoFidela Salisbury.D.   On: 03/15/2017 16:38   Ct Abdomen Pelvis W Contrast  Result Date: 02/28/2017 CLINICAL DATA:  Chronic diarrhea.  Weight loss. EXAM: CT ABDOMEN AND PELVIS WITH CONTRAST TECHNIQUE: Multidetector  CT imaging of the abdomen and pelvis was performed using the standard protocol following bolus administration of intravenous contrast. CONTRAST:  <See Chart> ISOVUE-300 IOPAMIDOL (ISOVUE-300) INJECTION 61% COMPARISON:  01/15/2016 FINDINGS: Lower chest: Small bilateral pleural effusions are associated with by basilar collapse/ consolidation. Hepatobiliary: No focal abnormality within the liver parenchyma. Mild periportal edema noted. Gallbladder surgically absent. No intrahepatic or extrahepatic biliary dilation. Pancreas: No focal mass lesion. No dilatation of the main duct. No intraparenchymal cyst. No peripancreatic edema. Spleen: No splenomegaly. No focal mass lesion. Adrenals/Urinary Tract: No adrenal nodule or mass. Kidneys are unremarkable. No evidence for hydroureter. Urinary bladder is decompressed by Foley catheter but bladder wall appears thick despite the decompressed state. Stomach/Bowel: Stomach is  nondistended. No gastric wall thickening. No evidence of outlet obstruction. Duodenum is normally positioned as is the ligament of Treitz. No small bowel wall thickening. No small bowel dilatation. The terminal ileum is normal. The appendix is not visualized, but there is no edema or inflammation in the region of the cecum. No gross colonic mass. No colonic wall thickening. No substantial diverticular change. Vascular/Lymphatic: No abdominal aortic aneurysm. No abdominal aortic atherosclerotic calcification. Small gastrohepatic ligament lymph node without other lymphadenopathy in the abdomen. No pelvic sidewall lymphadenopathy. Reproductive: Uterus unremarkable.  There is no adnexal mass. Other: Moderate volume ascites is associated with diffuse mesenteric retroperitoneal and body wall edema. Musculoskeletal: Complex perirectal abscess tracks posteriorly into the subcutaneous fat in then up behind the sacrum. The lesion tracks caudally in the subcutaneous fat deep to the right intergluteal fold towards the perineum but has been incompletely visualized in its lower extent. IMPRESSION: 1. Complex perirectal fluid collection tracks up behind the sacrum and down along the right intergluteal fold towards the perineum although inferior extent of the abscess has been incompletely visualized. 2. Moderate volume ascites with diffuse soft tissue and body wall edema. 3. Bilateral lower lung collapse/consolidation with small bilateral pleural effusions. Electronically Signed   By: Misty Stanley M.D.   On: 02/28/2017 13:56   US Abdomen Limited  Result Date: 03/18/2017 CLINICAL DATA:  26 year old female with anasarca and possible ascites. EXAM: LIMITED ABDOMEN ULTRASOUND FOR ASCITES TECHNIQUE: Limited ultrasound survey for ascites was performed in all four abdominal quadrants. COMPARISON:  CT scan of the abdomen and pelvis 03/05/2017 FINDINGS: Sonographic interrogation of the 4 quadrants of the abdomen demonstrates trace  ascites. There is insufficient fluid for paracentesis. IMPRESSION: 1. Trace ascites. 2. Paracentesis was deferred. Electronically Signed   By: Jacqulynn Cadet M.D.   On: 03/18/2017 11:29   US Paracentesis  Result Date: 03/12/2017 INDICATION: Patient with history of diarrhea, low albumin/malnutrition, diabetes, ascites, perirectal fluid collection; request made for diagnostic and therapeutic paracentesis. EXAM: ULTRASOUND GUIDED DIAGNOSTIC PARACENTESIS MEDICATIONS: None. COMPLICATIONS: None immediate. PROCEDURE: Informed written consent was obtained from the patient after a discussion of the risks, benefits and alternatives to treatment. A timeout was performed prior to the initiation of the procedure. Initial ultrasound scanning demonstrates a small amount of ascites within the right mid to lower abdominal quadrant. The right mid to lower abdomen was prepped and draped in the usual sterile fashion. 2% lidocaine was used for local anesthesia. Following this, a Yueh catheter was introduced. An ultrasound image was saved for documentation purposes. The paracentesis was performed. The catheter was removed and a dressing was applied. The patient tolerated the procedure well without immediate post procedural complication. FINDINGS: A total of approximately 65 cc of clear, colorless fluid was removed. Samples were sent to the  laboratory as requested by the clinical team. IMPRESSION: Successful ultrasound-guided diagnostic paracentesis yielding 65 cc of peritoneal fluid. Read by: Rowe Robert, PA-C Electronically Signed   By: Jerilynn Mages.  Shick M.D.   On: 03/11/2017 17:51   Dg Chest Port 1 View  Result Date: 03/17/2017 CLINICAL DATA:  Initial evaluation for acute shortness of breath, anasarca. EXAM: PORTABLE CHEST 1 VIEW COMPARISON:  Prior radiograph from 02/26/2017. FINDINGS: Patient is rotated to the right. Allowing for rotation, cardiac and mediastinal silhouettes are stable in size and contour, and remain within  normal limits. Lungs hypoinflated. Diffuse vascular and interstitial prominence suggesting diffuse pulmonary interstitial edema. Layering right pleural effusion. Associated right basilar opacity favored to reflect edema and/or effusion with atelectasis. Infiltrate could be considered in the correct clinical setting. No pneumothorax. Osseous structures within normal limits. IMPRESSION: 1. Findings suggestive of diffuse pulmonary interstitial edema. 2. Small to moderate layering right pleural effusion. Associated right basilar opacity favored to reflect edema and/or atelectasis. Infiltrate could be considered in the correct clinical setting. Electronically Signed   By: Jeannine Boga M.D.   On: 03/17/2017 02:28   Dg Chest Port 1 View  Result Date: 02/26/2017 CLINICAL DATA:  26 year old female with weakness and fatigue. EXAM: PORTABLE CHEST 1 VIEW COMPARISON:  12/14/2016 FINDINGS: The heart size and mediastinal contours are within normal limits. No focal parenchymal opacity or sizable pleural effusion. Small pneumothorax difficult to definitively exclude on supine only radiographs. The visualized skeletal structures are unremarkable. IMPRESSION: No active disease. Electronically Signed   By: Kristopher Oppenheim M.D.   On: 02/26/2017 17:30    Microbiology: Recent Results (from the past 240 hour(s))  Wound or Superficial Culture     Status: None (Preliminary result)   Collection Time: 03/23/17  3:07 AM  Result Value Ref Range Status   Specimen Description WOUND ULCLER  Final   Special Requests Immunocompromised  Final   Gram Stain   Final    MODERATE WBC PRESENT, PREDOMINANTLY PMN FEW GRAM NEGATIVE RODS RARE GRAM POSITIVE COCCI IN PAIRS    Culture   Final    CULTURE REINCUBATED FOR BETTER GROWTH Performed at [facility], [place] [address], [place], [place] [address]    Report Status PENDING  Incomplete  Blood Culture (routine x 2)     Status: None (Preliminary result)   Collection Time:  03/23/17  3:40 AM  Result Value Ref Range Status   Specimen Description BLOOD LEFT ANTECUBITAL  Final   Special Requests IN PEDIATRIC BOTTLE Blood Culture adequate volume  Final   Culture   Final    NO GROWTH 1 DAY Performed at [facility], [place] [address] [place], [place] [address]    Report Status PENDING  Incomplete  Blood Culture (routine x 2)     Status: None (Preliminary result)   Collection Time: 03/23/17  3:53 AM  Result Value Ref Range Status   Specimen Description BLOOD RIGHT ANTECUBITAL  Final   Special Requests   Final    BOTTLES DRAWN AEROBIC AND ANAEROBIC Blood Culture adequate volume   Culture   Final    NO GROWTH 1 DAY Performed at [facility], [place] [address], [place], [place] [address]    Report Status PENDING  Incomplete     Labs: Basic Metabolic Panel: Recent Labs  Lab 03/19/17 0401 03/20/17 0421 03/21/17 0510 03/23/17 0305 03/24/17 0447  NA 139 140 136 136 138  K 4.1 4.4 4.5 4.0 4.0  CL 102 101  96* 99* 105  CO2 34* 34* 34* 28 27  GLUCOSE 177* 192* 170* 125* 157*  BUN 10 12 13 12 10   CREATININE 0.43* 0.37* 0.41* 0.41* 0.35*  CALCIUM 8.0* 8.3* 8.5* 8.4* 8.3*  MG 1.9  --   --   --   --    Liver Function Tests: Recent Labs  Lab 03/23/17 0305  AST 51*  ALT 20  ALKPHOS 99  BILITOT 0.6  PROT 7.1  ALBUMIN 2.2*   No results for input(s): LIPASE, AMYLASE in the last 168 hours. No results for input(s): AMMONIA in the last 168 hours. CBC: Recent Labs  Lab 03/18/17 0555 03/20/17 0421 03/21/17 0510 03/23/17 0305 03/24/17 0447  WBC 5.7 6.2 5.4 6.5 5.4  NEUTROABS  --   --   --  3.9  --   HGB 9.2* 8.3* 8.3* 8.6* 8.6*  HCT 30.0* 26.3* 26.6* 27.0* 27.3*  MCV 95.8 95.6 95.0 95.4 94.5  PLT 411* 330 327 360 275   Cardiac Enzymes: No results for input(s): CKTOTAL, CKMB, CKMBINDEX, TROPONINI in the last 168 hours. BNP: BNP (last 3 results) No results for input(s): BNP in the last 8760 hours.  ProBNP (last 3 results) No results for  input(s): PROBNP in the last 8760 hours.  CBG: Recent Labs  Lab 03/23/17 2004 03/24/17 0010 03/24/17 0411 03/24/17 0759 03/24/17 1153  GLUCAP 330* 180* 145* 100* 121*

## 2017-03-24 NOTE — Progress Notes (Signed)
1 Day Post-Op penrose placement Subjective: No acute issues overnight  Objective: Vital signs in last 24 hours: Temp:  [98.2 F (36.8 C)-99 F (37.2 C)] 98.2 F (36.8 C) (12/25 0415) Pulse Rate:  [93-107] 93 (12/25 0415) Resp:  [11-18] 18 (12/25 0415) BP: (133-155)/(86-102) 136/86 (12/25 0415) SpO2:  [96 %-100 %] 99 % (12/25 0415)   Intake/Output from previous day: 12/24 0701 - 12/25 0700 In: 2713.8 [P.O.:480; I.V.:1683.8; IV Piggyback:550] Out: 2310 [Urine:2300; Blood:10] Intake/Output this shift: No intake/output data recorded.   General appearance: alert and cooperative  Incision: penrose in place  Lab Results:  Recent Labs    03/23/17 0305 03/24/17 0447  WBC 6.5 5.4  HGB 8.6* 8.6*  HCT 27.0* 27.3*  PLT 360 275   BMET Recent Labs    03/23/17 0305 03/24/17 0447  NA 136 138  K 4.0 4.0  CL 99* 105  CO2 28 27  GLUCOSE 125* 157*  BUN 12 10  CREATININE 0.41* 0.35*  CALCIUM 8.4* 8.3*   PT/INR No results for input(s): LABPROT, INR in the last 72 hours. ABG No results for input(s): PHART, HCO3 in the last 72 hours.  Invalid input(s): PCO2, PO2  MEDS, Scheduled . cholestyramine light  4 g Oral TID  . citalopram  20 mg Oral Daily  . diphenoxylate-atropine  2 tablet Oral QID  . feeding supplement (GLUCERNA SHAKE)  237 mL Oral BID BM  . ferrous sulfate  325 mg Oral BID WC  . folic acid  1 mg Oral Daily  . gabapentin  300 mg Oral BID  . insulin aspart  0-9 Units Subcutaneous Q4H  . insulin glargine  10 Units Subcutaneous BID  . lipase/protease/amylase  36,000 Units Oral TID WC  . loperamide  2 mg Oral TID AC    Studies/Results: Ct Pelvis W Contrast  Result Date: 03/23/2017 CLINICAL DATA:  Assess perirectal abscess. Status post debridement of abscess several weeks ago. Increasing drainage and pain. Fever. EXAM: CT PELVIS WITH CONTRAST TECHNIQUE: Multidetector CT imaging of the pelvis was performed using the standard protocol following the bolus  administration of intravenous contrast. CONTRAST:  172m ISOVUE-300 IOPAMIDOL (ISOVUE-300) INJECTION 61% COMPARISON:  CT of the abdomen and pelvis performed 03/15/2017 FINDINGS: Urinary Tract: The bladder is mildly distended and grossly unremarkable. Bowel: Visualized small and large bowel loops are grossly unremarkable. Vascular/Lymphatic: No acute vascular abnormalities are seen. No retroperitoneal or pelvic sidewall lymphadenopathy is appreciated. Reproductive: The uterus is grossly unremarkable. No suspicious adnexal masses are seen. The ovaries are relatively symmetric. Other: The previously noted abscess overlying the sacrum and coccyx has largely resolved. However, there is increasing thin multiloculated fluid and air tracking along the soft tissues superficial to the right gluteus musculature, measuring up to 9 cm, reflecting abscess. Diffuse soft tissue edema is seen about the pelvis. Musculoskeletal: No acute osseous abnormalities are identified. The visualized musculature is unremarkable in appearance. IMPRESSION: 1. Previously noted abscess overlying the sacrum and coccyx is largely resolved. However, there is increasing thin multiloculated abscess tracking along the soft tissues superficial to the right gluteus musculature, measuring up to 9 cm in length. 2. Diffuse soft tissue edema about the pelvis. Electronically Signed   By: JGarald BaldingM.D.   On: 03/23/2017 05:56    Assessment: s/p Procedure(s): IRRIGATION AND DEBRIDEMENT BUTTOCKS, SETON PLACEMENT Patient Active Problem List   Diagnosis Date Noted  . Cellulitis and abscess of buttock 03/23/2017  . Malnutrition of moderate degree 03/19/2017  . Anasarca 03/16/2017  . MDD (  major depressive disorder), recurrent episode, mild ([place]) 02/28/2017  . Diarrhea 02/27/2017  . Lower GI bleed 02/27/2017  . DKA (diabetic ketoacidosis) ([place]) 02/26/2017  . Acute metabolic encephalopathy   . DKA, type 1 ([place]) 06/11/2013  . Orthostatic hypotension  dysautonomic syndrome ([place]) 04/28/2013  . Body aches 04/28/2013  . Orthostatic hypotension 04/28/2013  . Diabetes mellitus type 1 ([place]) 04/28/2013  . Protein-calorie malnutrition, severe ([place]) 04/28/2013  . Noncompliance with diabetes treatment 09/01/2012  . Dyspnea 02/28/2012  . Sinus tachycardia 02/28/2012  . Hyperglycemia without ketosis 02/28/2012  . Chest pain 12/04/2011  . Diabetes type 1, uncontrolled ([place]) 11/14/2011  . Hypertension associated with diabetes ([place]) 11/14/2011  . Gastroparesis 11/14/2011  . Hyponatremia 09/19/2011  . Abdominal pain 09/18/2011  . Metabolic alkalosis 76/15/1834  . Diabetic hyperosmolar non-ketotic state ([place]) 09/18/2011  . DIAB W/UNSPEC COMP TYPE I [JUV TYPE] UNCNTRL 06/04/2010  . CONSTIPATION 01/01/2010  . RECTAL PAIN 01/01/2010      Plan: ok for d/c from surgical standpoint  Can f/u with me or Dr Lucia Gaskins Needs plan for Crohn's treatment    LOS: 0 days     .Rosario Adie, MD [facility], [place]   03/24/2017 7:49 AM

## 2017-03-25 LAB — AEROBIC CULTURE  (SUPERFICIAL SPECIMEN)

## 2017-03-25 LAB — AEROBIC CULTURE W GRAM STAIN (SUPERFICIAL SPECIMEN)

## 2017-03-28 ENCOUNTER — Encounter (HOSPITAL_COMMUNITY): Payer: Self-pay | Admitting: Nurse Practitioner

## 2017-03-28 ENCOUNTER — Emergency Department (HOSPITAL_COMMUNITY)
Admission: EM | Admit: 2017-03-28 | Discharge: 2017-03-28 | Disposition: A | Discharge status: Home / Self Care | Payer: 59 | Attending: Emergency Medicine | Admitting: Emergency Medicine

## 2017-03-28 DIAGNOSIS — L0231 Cutaneous abscess of buttock: Secondary | ICD-10-CM | POA: Insufficient documentation

## 2017-03-28 DIAGNOSIS — F419 Anxiety disorder, unspecified: Secondary | ICD-10-CM | POA: Diagnosis not present

## 2017-03-28 DIAGNOSIS — Z9049 Acquired absence of other specified parts of digestive tract: Secondary | ICD-10-CM | POA: Insufficient documentation

## 2017-03-28 DIAGNOSIS — R222 Localized swelling, mass and lump, trunk: Secondary | ICD-10-CM | POA: Diagnosis present

## 2017-03-28 DIAGNOSIS — Z79899 Other long term (current) drug therapy: Secondary | ICD-10-CM | POA: Insufficient documentation

## 2017-03-28 DIAGNOSIS — I10 Essential (primary) hypertension: Secondary | ICD-10-CM | POA: Diagnosis not present

## 2017-03-28 DIAGNOSIS — E109 Type 1 diabetes mellitus without complications: Secondary | ICD-10-CM | POA: Diagnosis not present

## 2017-03-28 DIAGNOSIS — Z794 Long term (current) use of insulin: Secondary | ICD-10-CM | POA: Insufficient documentation

## 2017-03-28 DIAGNOSIS — F329 Major depressive disorder, single episode, unspecified: Secondary | ICD-10-CM | POA: Insufficient documentation

## 2017-03-28 DIAGNOSIS — Z91010 Allergy to peanuts: Secondary | ICD-10-CM | POA: Diagnosis not present

## 2017-03-28 LAB — CBC WITH DIFFERENTIAL/PLATELET
Basophils Absolute: 0 10*3/uL (ref 0.0–0.1)
Basophils Relative: 1 %
Eosinophils Absolute: 0.2 10*3/uL (ref 0.0–0.7)
Eosinophils Relative: 4 %
HCT: 30.7 % — ABNORMAL LOW (ref 36.0–46.0)
Hemoglobin: 9.5 g/dL — ABNORMAL LOW (ref 12.0–15.0)
Lymphocytes Relative: 38 %
Lymphs Abs: 2.2 10*3/uL (ref 0.7–4.0)
MCH: 29.3 pg (ref 26.0–34.0)
MCHC: 30.9 g/dL (ref 30.0–36.0)
MCV: 94.8 fL (ref 78.0–100.0)
Monocytes Absolute: 0.6 10*3/uL (ref 0.1–1.0)
Monocytes Relative: 10 %
Neutro Abs: 2.8 10*3/uL (ref 1.7–7.7)
Neutrophils Relative %: 47 %
Platelets: 348 10*3/uL (ref 150–400)
RBC: 3.24 MIL/uL — ABNORMAL LOW (ref 3.87–5.11)
RDW: 13.4 % (ref 11.5–15.5)
WBC: 5.8 10*3/uL (ref 4.0–10.5)

## 2017-03-28 LAB — COMPREHENSIVE METABOLIC PANEL
ALT: 43 U/L (ref 14–54)
AST: 68 U/L — ABNORMAL HIGH (ref 15–41)
Albumin: 2.6 g/dL — ABNORMAL LOW (ref 3.5–5.0)
Alkaline Phosphatase: 99 U/L (ref 38–126)
Anion gap: 6 (ref 5–15)
BUN: 13 mg/dL (ref 6–20)
CO2: 30 mmol/L (ref 22–32)
Calcium: 9 mg/dL (ref 8.9–10.3)
Chloride: 106 mmol/L (ref 101–111)
Creatinine, Ser: 0.47 mg/dL (ref 0.44–1.00)
GFR calc Af Amer: >60 mL/min (ref >60)
GFR calc non Af Amer: >60 mL/min (ref >60)
Glucose, Bld: 117 mg/dL — ABNORMAL HIGH (ref 65–99)
Potassium: 4.2 mmol/L (ref 3.5–5.1)
Sodium: 142 mmol/L (ref 135–145)
Total Bilirubin: 0.3 mg/dL (ref 0.3–1.2)
Total Protein: 8.2 g/dL — ABNORMAL HIGH (ref 6.5–8.1)

## 2017-03-28 LAB — CULTURE, BLOOD (ROUTINE X 2)
Culture: NO GROWTH
Culture: NO GROWTH
Special Requests: ADEQUATE
Special Requests: ADEQUATE

## 2017-03-28 LAB — I-STAT CG4 LACTIC ACID, ED: Lactic Acid, Venous: 0.62 mmol/L (ref 0.5–1.9)

## 2017-03-28 LAB — I-STAT BETA HCG BLOOD, ED (MC, WL, AP ONLY): I-stat hCG, quantitative: <5 m[IU]/mL (ref <5)

## 2017-03-28 MED ORDER — SULFAMETHOXAZOLE-TRIMETHOPRIM 800-160 MG PO TABS
1.0000 | ORAL_TABLET | Freq: Once | ORAL | Status: AC
  Administered 2017-03-28: 1 via ORAL
  Filled 2017-03-28: qty 1

## 2017-03-28 MED ORDER — MORPHINE SULFATE (PF) 4 MG/ML IV SOLN
4.0000 mg | Freq: Once | INTRAVENOUS | Status: DC
  Filled 2017-03-28: qty 1

## 2017-03-28 MED ORDER — MORPHINE SULFATE (PF) 4 MG/ML IV SOLN
4.0000 mg | Freq: Once | INTRAVENOUS | Status: AC
  Administered 2017-03-28: 4 mg via INTRAMUSCULAR

## 2017-03-28 NOTE — ED Triage Notes (Signed)
Pt present with c/o gluteal ark pain secondary to a perirectal abscess that was surgically I&Ded on 03/23/17. States she has noted malodorous serosanguinous drainage and worsening pain to the area. She denies fever, chills or malaise as to suggest a systemic infection at this time.

## 2017-03-28 NOTE — ED Provider Notes (Signed)
[place] DEPT Provider Note   CSN: [id number] Arrival date & time: 03/28/17  1400     History   Chief Complaint Chief Complaint  Patient presents with  . Abscess    HPI [name] is a 26 y.o. female.  HPI Patient presented to the emergency room for evaluation of a buttock and perirectal abscess.  Patient had surgical treatment this month.  This was performed on December 24.  She had an I&D and a seton placement.  Patient was concerned because she noticed some increasing drainage today.  She also had some increasing tenderness.  She denies any fevers or chills.  No vomiting or diarrhea.  Patient is scheduled to see her surgeon in January. Past Medical History:  Diagnosis Date  . Anxiety   . Crohn's disease ([place])   . Diabetes type 1, uncontrolled ([place]) 11/14/2011   Since age 58  . Fibromyalgia   . Gastroparesis   . Hypertension   . Infection    UTI April 2016    Patient Active Problem List   Diagnosis Date Noted  . Abscess and cellulitis of gluteal region 03/24/2017  . Cellulitis and abscess of buttock 03/23/2017  . Malnutrition of moderate degree 03/19/2017  . Anasarca 03/16/2017  . MDD (major depressive disorder), recurrent episode, mild ([place]) 02/28/2017  . Diarrhea 02/27/2017  . Lower GI bleed 02/27/2017  . DKA (diabetic ketoacidosis) ([place]) 02/26/2017  . Acute metabolic encephalopathy   . DKA, type 1 ([place]) 06/11/2013  . Orthostatic hypotension dysautonomic syndrome ([place]) 04/28/2013  . Body aches 04/28/2013  . Orthostatic hypotension 04/28/2013  . Diabetes mellitus type 1 ([place]) 04/28/2013  . Protein-calorie malnutrition, severe ([place]) 04/28/2013  . Noncompliance with diabetes treatment 09/01/2012  . Dyspnea 02/28/2012  . Sinus tachycardia 02/28/2012  . Hyperglycemia without ketosis 02/28/2012  . Chest pain 12/04/2011  . Diabetes type 1, uncontrolled ([place]) 11/14/2011  . Hypertension associated with diabetes ([place]) 11/14/2011    . Gastroparesis 11/14/2011  . Hyponatremia 09/19/2011  . Abdominal pain 09/18/2011  . Metabolic alkalosis 29/93/7169  . Diabetic hyperosmolar non-ketotic state ([place]) 09/18/2011  . DIAB W/UNSPEC COMP TYPE I [JUV TYPE] UNCNTRL 06/04/2010  . CONSTIPATION 01/01/2010  . RECTAL PAIN 01/01/2010    Past Surgical History:  Procedure Laterality Date  . ANKLE SURGERY    . CHOLECYSTECTOMY  11/15/2011   Procedure: LAPAROSCOPIC CHOLECYSTECTOMY WITH INTRAOPERATIVE CHOLANGIOGRAM;  Surgeon: [name], MD;  Location: [facility];  Service: General;  Laterality: N/A;  . ESOPHAGOGASTRODUODENOSCOPY  12/03/2011   Procedure: ESOPHAGOGASTRODUODENOSCOPY (EGD);  Surgeon: Beryle Beams, MD;  Location: [facility];  Service: Endoscopy;  Laterality: N/A;  . FLEXIBLE SIGMOIDOSCOPY N/A 03/10/2017   Procedure: FLEXIBLE SIGMOIDOSCOPY;  Surgeon: Carol Ada, MD;  Location: [facility];  Service: Endoscopy;  Laterality: N/A;  . INCISION AND DRAINAGE PERIRECTAL ABSCESS N/A 03/01/2017   Procedure: IRRIGATION AND DEBRIDEMENT PERIRECTAL ABSCESS;  Surgeon: Alphonsa Overall, MD;  Location: [facility];  Service: General;  Laterality: N/A;  . IRRIGATION AND DEBRIDEMENT BUTTOCKS N/A 03/23/2017   Procedure: IRRIGATION AND DEBRIDEMENT BUTTOCKS, SETON PLACEMENT;  Surgeon: [name], MD;  Location: [facility];  Service: General;  Laterality: N/A;  . LAPAROSCOPY  11/23/2011   Procedure: LAPAROSCOPY DIAGNOSTIC;  Surgeon: Edward Jolly, MD;  Location: [facility];  Service: General;  Laterality: N/A;    OB History    Gravida Para Term Preterm AB Living   1 1   1   1    SAB TAB Ectopic Multiple Live  Births         0 1       Home Medications    Prior to Admission medications   Medication Sig Start Date End Date Taking? Authorizing Provider  cholestyramine light (PREVALITE) 4 g packet Take 1 packet (4 g total) by mouth 3 (three) times daily. 03/14/17  Yes Lavina Hamman, MD  citalopram (CELEXA) 20 MG tablet Take 1 tablet (20 mg  total) by mouth daily. 03/15/17  Yes Lavina Hamman, MD  diphenoxylate-atropine (LOMOTIL) 2.5-0.025 MG tablet Take 2 tablets by mouth 4 (four) times daily. 03/14/17  Yes Lavina Hamman, MD  EPINEPHrine (EPIPEN 2-PAK) 0.3 mg/0.3 mL IJ SOAJ injection Inject 0.3 mLs (0.3 mg total) into the muscle as needed (allergic reaction). 03/21/17  Yes Aline August, MD  feeding supplement, GLUCERNA SHAKE, (GLUCERNA SHAKE) LIQD Take 237 mLs by mouth 2 (two) times daily between meals. 03/14/17  Yes Lavina Hamman, MD  ferrous sulfate 325 (65 FE) MG tablet Take 1 tablet (325 mg total) by mouth 2 (two) times daily with a meal. 03/14/17  Yes Lavina Hamman, MD  folic acid (FOLVITE) 1 MG tablet Take 1 tablet (1 mg total) by mouth daily. 03/15/17  Yes Lavina Hamman, MD  furosemide (LASIX) 40 MG tablet Take 1 tablet (40 mg total) by mouth daily. 03/21/17 04/20/17 Yes Aline August, MD  gabapentin (NEURONTIN) 300 MG capsule Take 1 capsule (300 mg total) by mouth 2 (two) times daily. 03/14/17  Yes Lavina Hamman, MD  glucose 4 GM chewable tablet Chew 1 tablet (4 g total) by mouth as needed for low blood sugar. 08/10/14  Yes Denney, Rachelle A, CNM  Ibuprofen (MIDOL) 200 MG CAPS Take 400 mg by mouth 2 (two) times daily as needed (pain).    Yes [provider]  insulin lispro (HUMALOG) 100 UNIT/ML injection BG 70 - 120: 0 units BG 121 - 150: 1 unit BG 151 - 200: 2 units BG 201 - 250: 3 units BG 251 - 300: 5 units BG 301 - 350: 7 units BG 351 - 400: 9 units Patient taking differently: Inject 0-9 Units into the skin 3 (three) times daily with meals. BG 70 - 120: 0 units BG 121 - 150: 1 unit BG 151 - 200: 2 units BG 201 - 250: 3 units BG 251 - 300: 5 units BG 301 - 350: 7 units BG 351 - 400: 9 units 03/14/17  Yes Lavina Hamman, MD  LANTUS 100 UNIT/ML injection Inject 10 Units into the skin 2 (two) times daily. 03/15/17  Yes [provider]  lipase/protease/amylase (CREON) 36000 UNITS CPEP capsule Take 1  capsule (36,000 Units total) by mouth 3 (three) times daily with meals. 03/14/17  Yes Lavina Hamman, MD  loperamide (IMODIUM) 2 MG capsule Take 1 capsule (2 mg total) by mouth 3 (three) times daily before meals. DO NOT TAKE IT FOR THE DAY YOU DONT HAVE A BOWEL MOVEMENT. 03/14/17  Yes Lavina Hamman, MD  methocarbamol (ROBAXIN) 500 MG tablet Take 1 tablet (500 mg total) by mouth every 8 (eight) hours as needed for muscle spasms. 03/14/17  Yes Lavina Hamman, MD  metoCLOPramide (REGLAN) 5 MG tablet Take 5 mg by mouth 4 (four) times daily -  before meals and at bedtime.   Yes [provider]  oxyCODONE-acetaminophen (PERCOCET/ROXICET) 5-325 MG tablet Take 1 tablet by mouth every 6 (six) hours as needed for severe pain. 03/21/17  Yes Alekh, Kshitiz,  MD  sulfamethoxazole-trimethoprim (BACTRIM DS,SEPTRA DS) 800-160 MG tablet Take 1 tablet by mouth 2 (two) times daily for 7 days. 03/24/17 03/31/17 Yes Robbie Lis, MD  traMADol (ULTRAM) 50 MG tablet Take 50 mg by mouth daily as needed for pain. 03/14/17  Yes [provider]  albuterol (PROVENTIL HFA;VENTOLIN HFA) 108 (90 Base) MCG/ACT inhaler Inhale 1-2 puffs into the lungs every 4 (four) hours as needed for wheezing or shortness of breath. Patient not taking: Reported on 03/15/2017 09/02/16   Street, [place], PA-C    Family History Family History  Problem Relation Age of Onset  . Diabetes Mother   . Hypertension Father     Social History Social History   Tobacco Use  . Smoking status: Never Smoker  . Smokeless tobacco: Never Used  Substance Use Topics  . Alcohol use: Yes    Alcohol/week: 0.0 oz    Comment: Once every couple of months   . Drug use: No    Comment: Previous use     Allergies   Peanut-containing drug products and Lactose intolerance (gi)   Review of Systems Review of Systems  All other systems reviewed and are negative.    Physical Exam Updated Vital Signs BP (!) 159/95 (BP Location: Left Arm)    Pulse 100   Temp 98.9 F (37.2 C) (Oral)   Resp 18   SpO2 100%   Physical Exam  Constitutional: She appears well-developed and well-nourished. No distress.  HENT:  Head: Normocephalic and atraumatic.  Right Ear: External ear normal.  Left Ear: External ear normal.  Eyes: Conjunctivae are normal. Right eye exhibits no discharge. Left eye exhibits no discharge. No scleral icterus.  Neck: Neck supple. No tracheal deviation present.  Cardiovascular: Normal rate.  Pulmonary/Chest: Effort normal. No stridor. No respiratory distress.  Abdominal: She exhibits no distension.  Genitourinary:  Genitourinary Comments: Buttock abscess status post I&D, a Penrose drain in place, small amount of drainage near the drain, no areas of erythema or fluctuance  Musculoskeletal: She exhibits no edema.  Neurological: She is alert. Cranial nerve deficit: no gross deficits.  Skin: Skin is warm and dry. No rash noted.  Psychiatric: She has a normal mood and affect.  Nursing note and vitals reviewed.    ED Treatments / Results  Labs (all labs ordered are listed, but only abnormal results are displayed) Labs Reviewed  COMPREHENSIVE METABOLIC PANEL - Abnormal; Notable for the following components:      Result Value   Glucose, Bld 117 (*)    Total Protein 8.2 (*)    Albumin 2.6 (*)    AST 68 (*)    All other components within normal limits  CBC WITH DIFFERENTIAL/PLATELET - Abnormal; Notable for the following components:   RBC 3.24 (*)    Hemoglobin 9.5 (*)    HCT 30.7 (*)    All other components within normal limits  I-STAT CG4 LACTIC ACID, ED  I-STAT BETA HCG BLOOD, ED ([facility], [facility], [facility] ONLY)      Radiology No results found.  Procedures Procedures (including critical care time)  Medications Ordered in ED Medications  morphine 4 MG/ML injection 4 mg (not administered)  sulfamethoxazole-trimethoprim (BACTRIM DS,SEPTRA DS) 800-160 MG per tablet 1 tablet (1 tablet Oral Given 03/28/17 2013)      Initial Impression / Assessment and Plan / ED Course  I have reviewed the triage vital signs and the nursing notes.  Pertinent labs & imaging results that were available during my care of the  patient were reviewed by me and considered in my medical decision making (see chart for details).   Patient is status post I&D of an abscess.  She has a drain in place.  Patient does have some purulent drainage but this appears to be expected considering the procedure she had done.  I am not seeing any signs of increasing induration or erythema to suggest acute infection.  Patient was given a dose of medications for pain.  She did mention that she was supposed to be taking Bactrim.  She had not picked up the prescription since her discharge from the hospital.  I encouraged her to do so and start taking that medication.  Final Clinical Impressions(s) / ED Diagnoses   Final diagnoses:  Abscess of buttock    ED Discharge Orders    None       Dorie Rank, MD 03/28/17 2035

## 2017-03-28 NOTE — Discharge Instructions (Signed)
Continue your current medications.  Make sure to start taking your Bactrim.  Follow-up with your general surgeon

## 2017-03-31 ENCOUNTER — Emergency Department (HOSPITAL_COMMUNITY)
Admission: EM | Admit: 2017-03-31 | Discharge: 2017-03-31 | Disposition: A | Discharge status: Home / Self Care | Payer: 59 | Attending: Emergency Medicine | Admitting: Emergency Medicine

## 2017-03-31 ENCOUNTER — Encounter (HOSPITAL_COMMUNITY): Payer: Self-pay

## 2017-03-31 DIAGNOSIS — E109 Type 1 diabetes mellitus without complications: Secondary | ICD-10-CM | POA: Diagnosis not present

## 2017-03-31 DIAGNOSIS — K5090 Crohn's disease, unspecified, without complications: Secondary | ICD-10-CM | POA: Diagnosis not present

## 2017-03-31 DIAGNOSIS — M545 Low back pain: Secondary | ICD-10-CM | POA: Insufficient documentation

## 2017-03-31 DIAGNOSIS — I10 Essential (primary) hypertension: Secondary | ICD-10-CM | POA: Insufficient documentation

## 2017-03-31 DIAGNOSIS — Z794 Long term (current) use of insulin: Secondary | ICD-10-CM | POA: Insufficient documentation

## 2017-03-31 DIAGNOSIS — Z79899 Other long term (current) drug therapy: Secondary | ICD-10-CM | POA: Diagnosis not present

## 2017-03-31 DIAGNOSIS — K611 Rectal abscess: Secondary | ICD-10-CM | POA: Insufficient documentation

## 2017-03-31 LAB — CBC WITH DIFFERENTIAL/PLATELET
Basophils Absolute: 0 10*3/uL (ref 0.0–0.1)
Basophils Relative: 1 %
Eosinophils Absolute: 0.2 10*3/uL (ref 0.0–0.7)
Eosinophils Relative: 3 %
HCT: 30.1 % — ABNORMAL LOW (ref 36.0–46.0)
Hemoglobin: 9.4 g/dL — ABNORMAL LOW (ref 12.0–15.0)
Lymphocytes Relative: 37 %
Lymphs Abs: 1.9 10*3/uL (ref 0.7–4.0)
MCH: 29.2 pg (ref 26.0–34.0)
MCHC: 31.2 g/dL (ref 30.0–36.0)
MCV: 93.5 fL (ref 78.0–100.0)
Monocytes Absolute: 0.3 10*3/uL (ref 0.1–1.0)
Monocytes Relative: 5 %
Neutro Abs: 2.8 10*3/uL (ref 1.7–7.7)
Neutrophils Relative %: 54 %
Platelets: 259 10*3/uL (ref 150–400)
RBC: 3.22 MIL/uL — ABNORMAL LOW (ref 3.87–5.11)
RDW: 12.9 % (ref 11.5–15.5)
WBC: 5.2 10*3/uL (ref 4.0–10.5)

## 2017-03-31 LAB — COMPREHENSIVE METABOLIC PANEL
ALT: 33 U/L (ref 14–54)
AST: 40 U/L (ref 15–41)
Albumin: 2.7 g/dL — ABNORMAL LOW (ref 3.5–5.0)
Alkaline Phosphatase: 91 U/L (ref 38–126)
Anion gap: 6 (ref 5–15)
BUN: 11 mg/dL (ref 6–20)
CO2: 26 mmol/L (ref 22–32)
Calcium: 8.7 mg/dL — ABNORMAL LOW (ref 8.9–10.3)
Chloride: 108 mmol/L (ref 101–111)
Creatinine, Ser: 0.68 mg/dL (ref 0.44–1.00)
GFR calc Af Amer: >60 mL/min (ref >60)
GFR calc non Af Amer: >60 mL/min (ref >60)
Glucose, Bld: 78 mg/dL (ref 65–99)
Potassium: 3.8 mmol/L (ref 3.5–5.1)
Sodium: 140 mmol/L (ref 135–145)
Total Bilirubin: 0.4 mg/dL (ref 0.3–1.2)
Total Protein: 7.7 g/dL (ref 6.5–8.1)

## 2017-03-31 MED ORDER — OXYCODONE-ACETAMINOPHEN 5-325 MG PO TABS
2.0000 | ORAL_TABLET | Freq: Once | ORAL | Status: AC
  Administered 2017-03-31: 2 via ORAL
  Filled 2017-03-31: qty 2

## 2017-03-31 MED ORDER — OXYCODONE-ACETAMINOPHEN 5-325 MG PO TABS: 1.0000 | ORAL_TABLET | Freq: Four times a day (QID) | ORAL | 0 refills | Status: DC | PRN

## 2017-03-31 NOTE — ED Notes (Signed)
Bed: WTR5 Expected date:  Expected time:  Means of arrival:  Comments: 

## 2017-03-31 NOTE — Discharge Instructions (Signed)
Call the Surgeon to be seen tomorrow for recheck. Return if fever or increased redness

## 2017-03-31 NOTE — ED Provider Notes (Signed)
[facility] DEPT Provider Note   CSN: [id number] Arrival date & time: 03/31/17  1941     History   Chief Complaint Chief Complaint  Patient presents with  . Recurrent Skin Infections    HPI ALENA BLANKENBECKLER is a 27 y.o. female.  The history is provided by the patient. No language interpreter was used.  Abscess  Abscess location: buttock. Abscess quality: draining and fluctuance   Progression:  Worsening Chronicity:  Recurrent Relieved by:  Nothing Worsened by:  Nothing Ineffective treatments:  None tried Associated symptoms: no fever    Pt reports she had an abscess drained by surgery.  Pt reports increased pain at site with pain in her back and pain in her leg.  Pt reports area is draining still.  Past Medical History:  Diagnosis Date  . Anxiety   . Crohn's disease ([place])   . Diabetes type 1, uncontrolled ([place]) 11/14/2011   Since age 26  . Fibromyalgia   . Gastroparesis   . Hypertension   . Infection    UTI April 2016    Patient Active Problem List   Diagnosis Date Noted  . Abscess and cellulitis of gluteal region 03/24/2017  . Cellulitis and abscess of buttock 03/23/2017  . Malnutrition of moderate degree 03/19/2017  . Anasarca 03/16/2017  . MDD (major depressive disorder), recurrent episode, mild ([place]) 02/28/2017  . Diarrhea 02/27/2017  . Lower GI bleed 02/27/2017  . DKA (diabetic ketoacidosis) ([place]) 02/26/2017  . Acute metabolic encephalopathy   . DKA, type 1 ([place]) 06/11/2013  . Orthostatic hypotension dysautonomic syndrome ([place]) 04/28/2013  . Body aches 04/28/2013  . Orthostatic hypotension 04/28/2013  . Diabetes mellitus type 1 ([place]) 04/28/2013  . Protein-calorie malnutrition, severe ([place]) 04/28/2013  . Noncompliance with diabetes treatment 09/01/2012  . Dyspnea 02/28/2012  . Sinus tachycardia 02/28/2012  . Hyperglycemia without ketosis 02/28/2012  . Chest pain 12/04/2011  . Diabetes type 1, uncontrolled ([place]) 11/14/2011   . Hypertension associated with diabetes ([place]) 11/14/2011  . Gastroparesis 11/14/2011  . Hyponatremia 09/19/2011  . Abdominal pain 09/18/2011  . Metabolic alkalosis 23/34/3568  . Diabetic hyperosmolar non-ketotic state ([place]) 09/18/2011  . DIAB W/UNSPEC COMP TYPE I [JUV TYPE] UNCNTRL 06/04/2010  . CONSTIPATION 01/01/2010  . RECTAL PAIN 01/01/2010    Past Surgical History:  Procedure Laterality Date  . ANKLE SURGERY    . CHOLECYSTECTOMY  11/15/2011   Procedure: LAPAROSCOPIC CHOLECYSTECTOMY WITH INTRAOPERATIVE CHOLANGIOGRAM;  Surgeon: [name], MD;  Location: [facility];  Service: General;  Laterality: N/A;  . ESOPHAGOGASTRODUODENOSCOPY  12/03/2011   Procedure: ESOPHAGOGASTRODUODENOSCOPY (EGD);  Surgeon: Beryle Beams, MD;  Location: [facility];  Service: Endoscopy;  Laterality: N/A;  . FLEXIBLE SIGMOIDOSCOPY N/A 03/10/2017   Procedure: FLEXIBLE SIGMOIDOSCOPY;  Surgeon: Carol Ada, MD;  Location: [facility];  Service: Endoscopy;  Laterality: N/A;  . INCISION AND DRAINAGE PERIRECTAL ABSCESS N/A 03/01/2017   Procedure: IRRIGATION AND DEBRIDEMENT PERIRECTAL ABSCESS;  Surgeon: Alphonsa Overall, MD;  Location: [facility];  Service: General;  Laterality: N/A;  . IRRIGATION AND DEBRIDEMENT BUTTOCKS N/A 03/23/2017   Procedure: IRRIGATION AND DEBRIDEMENT BUTTOCKS, SETON PLACEMENT;  Surgeon: [name], MD;  Location: [facility];  Service: General;  Laterality: N/A;  . LAPAROSCOPY  11/23/2011   Procedure: LAPAROSCOPY DIAGNOSTIC;  Surgeon: Edward Jolly, MD;  Location: [facility];  Service: General;  Laterality: N/A;    OB History    Gravida Para Term Preterm AB Living   1 1   1  1   SAB TAB Ectopic Multiple Live Births         0 1       Home Medications    Prior to Admission medications   Medication Sig Start Date End Date Taking? Authorizing Provider  albuterol (PROVENTIL HFA;VENTOLIN HFA) 108 (90 Base) MCG/ACT inhaler Inhale 1-2 puffs into the lungs every 4 (four) hours as needed for  wheezing or shortness of breath. Patient not taking: Reported on 03/15/2017 09/02/16   Street, [place], PA-C  cholestyramine light (PREVALITE) 4 g packet Take 1 packet (4 g total) by mouth 3 (three) times daily. 03/14/17   Lavina Hamman, MD  citalopram (CELEXA) 20 MG tablet Take 1 tablet (20 mg total) by mouth daily. 03/15/17   Lavina Hamman, MD  diphenoxylate-atropine (LOMOTIL) 2.5-0.025 MG tablet Take 2 tablets by mouth 4 (four) times daily. 03/14/17   Lavina Hamman, MD  EPINEPHrine (EPIPEN 2-PAK) 0.3 mg/0.3 mL IJ SOAJ injection Inject 0.3 mLs (0.3 mg total) into the muscle as needed (allergic reaction). 03/21/17   Aline August, MD  feeding supplement, GLUCERNA SHAKE, (GLUCERNA SHAKE) LIQD Take 237 mLs by mouth 2 (two) times daily between meals. 03/14/17   Lavina Hamman, MD  ferrous sulfate 325 (65 FE) MG tablet Take 1 tablet (325 mg total) by mouth 2 (two) times daily with a meal. 03/14/17   Lavina Hamman, MD  folic acid (FOLVITE) 1 MG tablet Take 1 tablet (1 mg total) by mouth daily. 03/15/17   Lavina Hamman, MD  furosemide (LASIX) 40 MG tablet Take 1 tablet (40 mg total) by mouth daily. 03/21/17 04/20/17  Aline August, MD  gabapentin (NEURONTIN) 300 MG capsule Take 1 capsule (300 mg total) by mouth 2 (two) times daily. 03/14/17   Lavina Hamman, MD  glucose 4 GM chewable tablet Chew 1 tablet (4 g total) by mouth as needed for low blood sugar. 08/10/14   Kandis Cocking A, CNM  Ibuprofen (MIDOL) 200 MG CAPS Take 400 mg by mouth 2 (two) times daily as needed (pain).     [provider]  insulin lispro (HUMALOG) 100 UNIT/ML injection BG 70 - 120: 0 units BG 121 - 150: 1 unit BG 151 - 200: 2 units BG 201 - 250: 3 units BG 251 - 300: 5 units BG 301 - 350: 7 units BG 351 - 400: 9 units Patient taking differently: Inject 0-9 Units into the skin 3 (three) times daily with meals. BG 70 - 120: 0 units BG 121 - 150: 1 unit BG 151 - 200: 2 units BG 201 - 250: 3 units BG 251 - 300: 5  units BG 301 - 350: 7 units BG 351 - 400: 9 units 03/14/17   Lavina Hamman, MD  LANTUS 100 UNIT/ML injection Inject 10 Units into the skin 2 (two) times daily. 03/15/17   [provider]  lipase/protease/amylase (CREON) 36000 UNITS CPEP capsule Take 1 capsule (36,000 Units total) by mouth 3 (three) times daily with meals. 03/14/17   Lavina Hamman, MD  loperamide (IMODIUM) 2 MG capsule Take 1 capsule (2 mg total) by mouth 3 (three) times daily before meals. DO NOT TAKE IT FOR THE DAY YOU DONT HAVE A BOWEL MOVEMENT. 03/14/17   Lavina Hamman, MD  methocarbamol (ROBAXIN) 500 MG tablet Take 1 tablet (500 mg total) by mouth every 8 (eight) hours as needed for muscle spasms. 03/14/17   Lavina Hamman, MD  metoCLOPramide (REGLAN) 5 MG  tablet Take 5 mg by mouth 4 (four) times daily -  before meals and at bedtime.    [provider]  oxyCODONE-acetaminophen (PERCOCET/ROXICET) 5-325 MG tablet Take 1 tablet by mouth every 6 (six) hours as needed for severe pain. 03/21/17   Aline August, MD  sulfamethoxazole-trimethoprim (BACTRIM DS,SEPTRA DS) 800-160 MG tablet Take 1 tablet by mouth 2 (two) times daily for 7 days. 03/24/17 03/31/17  Robbie Lis, MD  traMADol (ULTRAM) 50 MG tablet Take 50 mg by mouth daily as needed for pain. 03/14/17   [provider]    Family History Family History  Problem Relation Age of Onset  . Diabetes Mother   . Hypertension Father     Social History Social History   Tobacco Use  . Smoking status: Never Smoker  . Smokeless tobacco: Never Used  Substance Use Topics  . Alcohol use: Yes    Alcohol/week: 0.0 oz    Comment: Once every couple of months   . Drug use: No    Comment: Previous use     Allergies   Peanut-containing drug products and Lactose intolerance (gi)   Review of Systems Review of Systems  Constitutional: Negative for chills and fever.  Musculoskeletal: Positive for back pain.     Physical Exam Updated Vital  Signs BP (!) 145/100 (BP Location: Right Arm)   Pulse (!) 107   Temp 98.4 F (36.9 C) (Oral)   Resp 20   SpO2 100%   Physical Exam  Constitutional: She appears well-developed and well-nourished. No distress.  HENT:  Head: Normocephalic and atraumatic.  Eyes: Conjunctivae are normal.  Neck: Neck supple.  Cardiovascular: Normal rate.  No murmur heard. Pulmonary/Chest: Effort normal. No respiratory distress.  Abdominal: There is no tenderness.  Musculoskeletal: She exhibits no edema.  Drain in place, oozing  No erythema,  No increased redness.   Neurological: She is alert.  Skin: Skin is warm and dry.  Psychiatric: She has a normal mood and affect.  Nursing note and vitals reviewed.    ED Treatments / Results  Labs (all labs ordered are listed, but only abnormal results are displayed) Labs Reviewed  CBC WITH DIFFERENTIAL/PLATELET - Abnormal; Notable for the following components:      Result Value   RBC 3.22 (*)    Hemoglobin 9.4 (*)    HCT 30.1 (*)    All other components within normal limits  COMPREHENSIVE METABOLIC PANEL - Abnormal; Notable for the following components:   Calcium 8.7 (*)    Albumin 2.7 (*)    All other components within normal limits    EKG  EKG Interpretation None       Radiology No results found.  Procedures Procedures (including critical care time)  Medications Ordered in ED Medications  oxyCODONE-acetaminophen (PERCOCET/ROXICET) 5-325 MG per tablet 2 tablet (2 tablets Oral Given 03/31/17 2020)     Initial Impression / Assessment and Plan / ED Course  I have reviewed the triage vital signs and the nursing notes.  Pertinent labs & imaging results that were available during my care of the patient were reviewed by me and considered in my medical decision making (see chart for details).     I will treat pain.Pt given 2 percocet here I advised pt to call surgeon to be seen tomorrow for recheck.    Final Clinical Impressions(s) / ED  Diagnoses   Final diagnoses:  Perirectal abscess    ED Discharge Orders  Ordered    oxyCODONE-acetaminophen (PERCOCET/ROXICET) 5-325 MG tablet  Every 6 hours PRN     03/31/17 2131    An After Visit Summary was printed and given to the patient.   Sidney Ace 03/31/17 2131    Charlesetta Shanks, MD 04/03/17 1758

## 2017-03-31 NOTE — ED Triage Notes (Signed)
Pt has a perirectal abscess that is draining and now she has sudden right leg and knee pain

## 2017-04-03 ENCOUNTER — Encounter: Payer: Self-pay | Admitting: Family Medicine

## 2017-04-03 ENCOUNTER — Other Ambulatory Visit: Payer: Self-pay

## 2017-04-03 ENCOUNTER — Ambulatory Visit (INDEPENDENT_AMBULATORY_CARE_PROVIDER_SITE_OTHER): Payer: 59 | Admitting: Family Medicine

## 2017-04-03 VITALS — BP 138/82 | HR 95 | Temp 98.0°F | Ht 67.0 in | Wt 151.6 lb

## 2017-04-03 DIAGNOSIS — I10 Essential (primary) hypertension: Secondary | ICD-10-CM

## 2017-04-03 DIAGNOSIS — F330 Major depressive disorder, recurrent, mild: Secondary | ICD-10-CM | POA: Diagnosis not present

## 2017-04-03 DIAGNOSIS — E10649 Type 1 diabetes mellitus with hypoglycemia without coma: Secondary | ICD-10-CM | POA: Diagnosis not present

## 2017-04-03 DIAGNOSIS — E1159 Type 2 diabetes mellitus with other circulatory complications: Secondary | ICD-10-CM

## 2017-04-03 DIAGNOSIS — L0231 Cutaneous abscess of buttock: Secondary | ICD-10-CM

## 2017-04-03 DIAGNOSIS — I152 Hypertension secondary to endocrine disorders: Secondary | ICD-10-CM

## 2017-04-03 DIAGNOSIS — L03317 Cellulitis of buttock: Secondary | ICD-10-CM | POA: Diagnosis not present

## 2017-04-03 DIAGNOSIS — Z7689 Persons encountering health services in other specified circumstances: Secondary | ICD-10-CM

## 2017-04-03 DIAGNOSIS — K50919 Crohn's disease, unspecified, with unspecified complications: Secondary | ICD-10-CM

## 2017-04-03 DIAGNOSIS — K5090 Crohn's disease, unspecified, without complications: Secondary | ICD-10-CM | POA: Insufficient documentation

## 2017-04-03 DIAGNOSIS — E440 Moderate protein-calorie malnutrition: Secondary | ICD-10-CM | POA: Diagnosis not present

## 2017-04-03 MED ORDER — LOPERAMIDE HCL 2 MG PO CAPS: 2.0000 mg | ORAL_CAPSULE | Freq: Three times a day (TID) | ORAL | 2 refills | Status: DC

## 2017-04-03 MED ORDER — GABAPENTIN 300 MG PO CAPS: 300.0000 mg | ORAL_CAPSULE | Freq: Two times a day (BID) | ORAL | 2 refills | Status: DC

## 2017-04-03 NOTE — Patient Instructions (Signed)
  You can call to schedule your appointment with a counselor. A few offices are listed below for you to call.   [facility]  [place], [place], [place] [address]  Phone: [phone]  [facility] [address] [place], [place] [address]  Phone: [phone]  [facility]  [address] [place], [place] [address] Phone: [phone]

## 2017-04-03 NOTE — Progress Notes (Signed)
Subjective:    Patient ID: [name], female    DOB: 1991/02/21, 27 y.o.   MRN: [id number]  HPI Chief Complaint  Patient presents with  . Follow-up    hosptital visit., RX request from hospital needed, needs referral to endocrinologist   She is new to the practice and here to establish care. She has a complex medical history. Reports being hospitalized for approximately one month due to DKA resulting from peri-rectal abscess. States she needs refills on Imodium, gabapentin and oxycodone.  Previous medical care: [facility]- [name]. Last there in 2016.   Other providers:  Endocrinologist- Dr. Minette Brine at [facility]. Last there in 2018. States she has to switch because she cannot afford to pay the bill and she was dismissed. checking her BS 5 times per day.  Dr. [facility]- saw them yesterday for follow up and will see them again in early February. She is having to perform her own wound care.  Dr. Collene Mares is her GI and treating Crohn's. Has appointment with her on the 17th.   Cardiologist - last seen in 2015.   Eyes- Dr. Earl Gala at Dr. Zenia Resides practice and then to [facility] 2018 for herpetic eye infection- this resolved.  OB/GYN- Dr. Jodi Mourning.    Depression- takes celexa. No therapist but would like to see someone.  Depression screen [facility] 2/9 04/03/2017 01/16/2015 01/09/2015 01/05/2015 12/28/2014  Decreased Interest 1 0 0 0 0  Down, Depressed, Hopeless 2 0 0 0 0  PHQ - 2 Score 3 0 0 0 0  Altered sleeping 1 - - - -  Tired, decreased energy 2 - - - -  Change in appetite 2 - - - -  Feeling bad or failure about yourself  1 - - - -  Trouble concentrating 1 - - - -  Moving slowly or fidgety/restless 0 - - - -  Suicidal thoughts 0 - - - -  PHQ-9 Score 10 - - - -    LMP: 10/29/2016 Negative HCG    Review of Systems Pertinent positives and negatives in the history of present illness.     Objective:   Physical Exam BP 138/82 (BP Location:  Right Arm, Patient Position: Sitting)   Pulse 95   Temp 98 F (36.7 C) (Oral)   Ht 5' 7"  (1.702 m)   Wt 151 lb 9.6 oz (68.8 kg)   LMP 10/29/2016 (Approximate)   SpO2 98%   BMI 23.74 kg/m   Alert and oriented and in no acute distress. Not otherwise examined.       Assessment & Plan:  Uncontrolled type 1 diabetes mellitus with hypoglycemia, unspecified hypoglycemia coma status ([place]) - Plan: Ambulatory referral to Endocrinology  Hypertension associated with diabetes ([place])  MDD (major depressive disorder), recurrent episode, mild ([place])  Malnutrition of moderate degree  Cellulitis and abscess of buttock  Crohn's disease with complication, unspecified gastrointestinal tract location [facility])  Encounter to establish care  Referral made to endocrinology. She is keeping a close eye on her blood sugars.  HTN- BP is close to goal range.  Depression- mood has improved with citalopram. She is interested in counseling. A list of counselors was provided. Her husband appears to be supportive.  She is trying to consume adequate calories which is difficult per patient due to decreased appetite, Crohn's and constant diarrhea.  She will see her new GI Dr. Collene Mares for Crohn's.  She is being closely followed by surgeon for peri rectal abscess.  She will contact them for refill of oxycodone.  Plan to have her return in 2 weeks for follow up on multiple complex health conditions.  Spent at least 45 minutes with patient and 50% or more was in counseling and coordination of care.

## 2017-04-06 ENCOUNTER — Emergency Department (HOSPITAL_COMMUNITY)
Admission: EM | Admit: 2017-04-06 | Discharge: 2017-04-06 | Disposition: A | Discharge status: Home / Self Care | Payer: 59 | Attending: Emergency Medicine | Admitting: Emergency Medicine

## 2017-04-06 ENCOUNTER — Emergency Department (HOSPITAL_COMMUNITY): Payer: 59

## 2017-04-06 ENCOUNTER — Encounter (HOSPITAL_COMMUNITY): Payer: Self-pay | Admitting: Emergency Medicine

## 2017-04-06 DIAGNOSIS — R0789 Other chest pain: Secondary | ICD-10-CM | POA: Insufficient documentation

## 2017-04-06 DIAGNOSIS — M545 Low back pain: Secondary | ICD-10-CM | POA: Insufficient documentation

## 2017-04-06 DIAGNOSIS — Z5321 Procedure and treatment not carried out due to patient leaving prior to being seen by health care provider: Secondary | ICD-10-CM | POA: Diagnosis not present

## 2017-04-06 DIAGNOSIS — M79606 Pain in leg, unspecified: Secondary | ICD-10-CM | POA: Insufficient documentation

## 2017-04-06 DIAGNOSIS — R0602 Shortness of breath: Secondary | ICD-10-CM | POA: Diagnosis not present

## 2017-04-06 LAB — BASIC METABOLIC PANEL
Anion gap: 7 (ref 5–15)
BUN: 15 mg/dL (ref 6–20)
CO2: 30 mmol/L (ref 22–32)
Calcium: 9.1 mg/dL (ref 8.9–10.3)
Chloride: 105 mmol/L (ref 101–111)
Creatinine, Ser: 0.48 mg/dL (ref 0.44–1.00)
GFR calc Af Amer: >60 mL/min (ref >60)
GFR calc non Af Amer: >60 mL/min (ref >60)
Glucose, Bld: 157 mg/dL — ABNORMAL HIGH (ref 65–99)
Potassium: 3.6 mmol/L (ref 3.5–5.1)
Sodium: 142 mmol/L (ref 135–145)

## 2017-04-06 LAB — CBC
HCT: 34.4 % — ABNORMAL LOW (ref 36.0–46.0)
Hemoglobin: 10.9 g/dL — ABNORMAL LOW (ref 12.0–15.0)
MCH: 29 pg (ref 26.0–34.0)
MCHC: 31.7 g/dL (ref 30.0–36.0)
MCV: 91.5 fL (ref 78.0–100.0)
Platelets: 273 10*3/uL (ref 150–400)
RBC: 3.76 MIL/uL — ABNORMAL LOW (ref 3.87–5.11)
RDW: 12.9 % (ref 11.5–15.5)
WBC: 4.6 10*3/uL (ref 4.0–10.5)

## 2017-04-06 LAB — I-STAT BETA HCG BLOOD, ED (MC, WL, AP ONLY): I-stat hCG, quantitative: <5 m[IU]/mL (ref <5)

## 2017-04-06 LAB — I-STAT TROPONIN, ED: Troponin i, poc: 0.01 ng/mL (ref 0.00–0.08)

## 2017-04-06 LAB — I-STAT CG4 LACTIC ACID, ED: Lactic Acid, Venous: 1.14 mmol/L (ref 0.5–1.9)

## 2017-04-06 IMAGING — CR DG CHEST 2V
2 series · 2 of 2 positions shown · non-contrast
Comparison: [DATE].

CLINICAL DATA: Chest discomfort, weakness, shortness of breath and
low back pain.

EXAM:
CHEST  2 VIEW

[w chest pa]
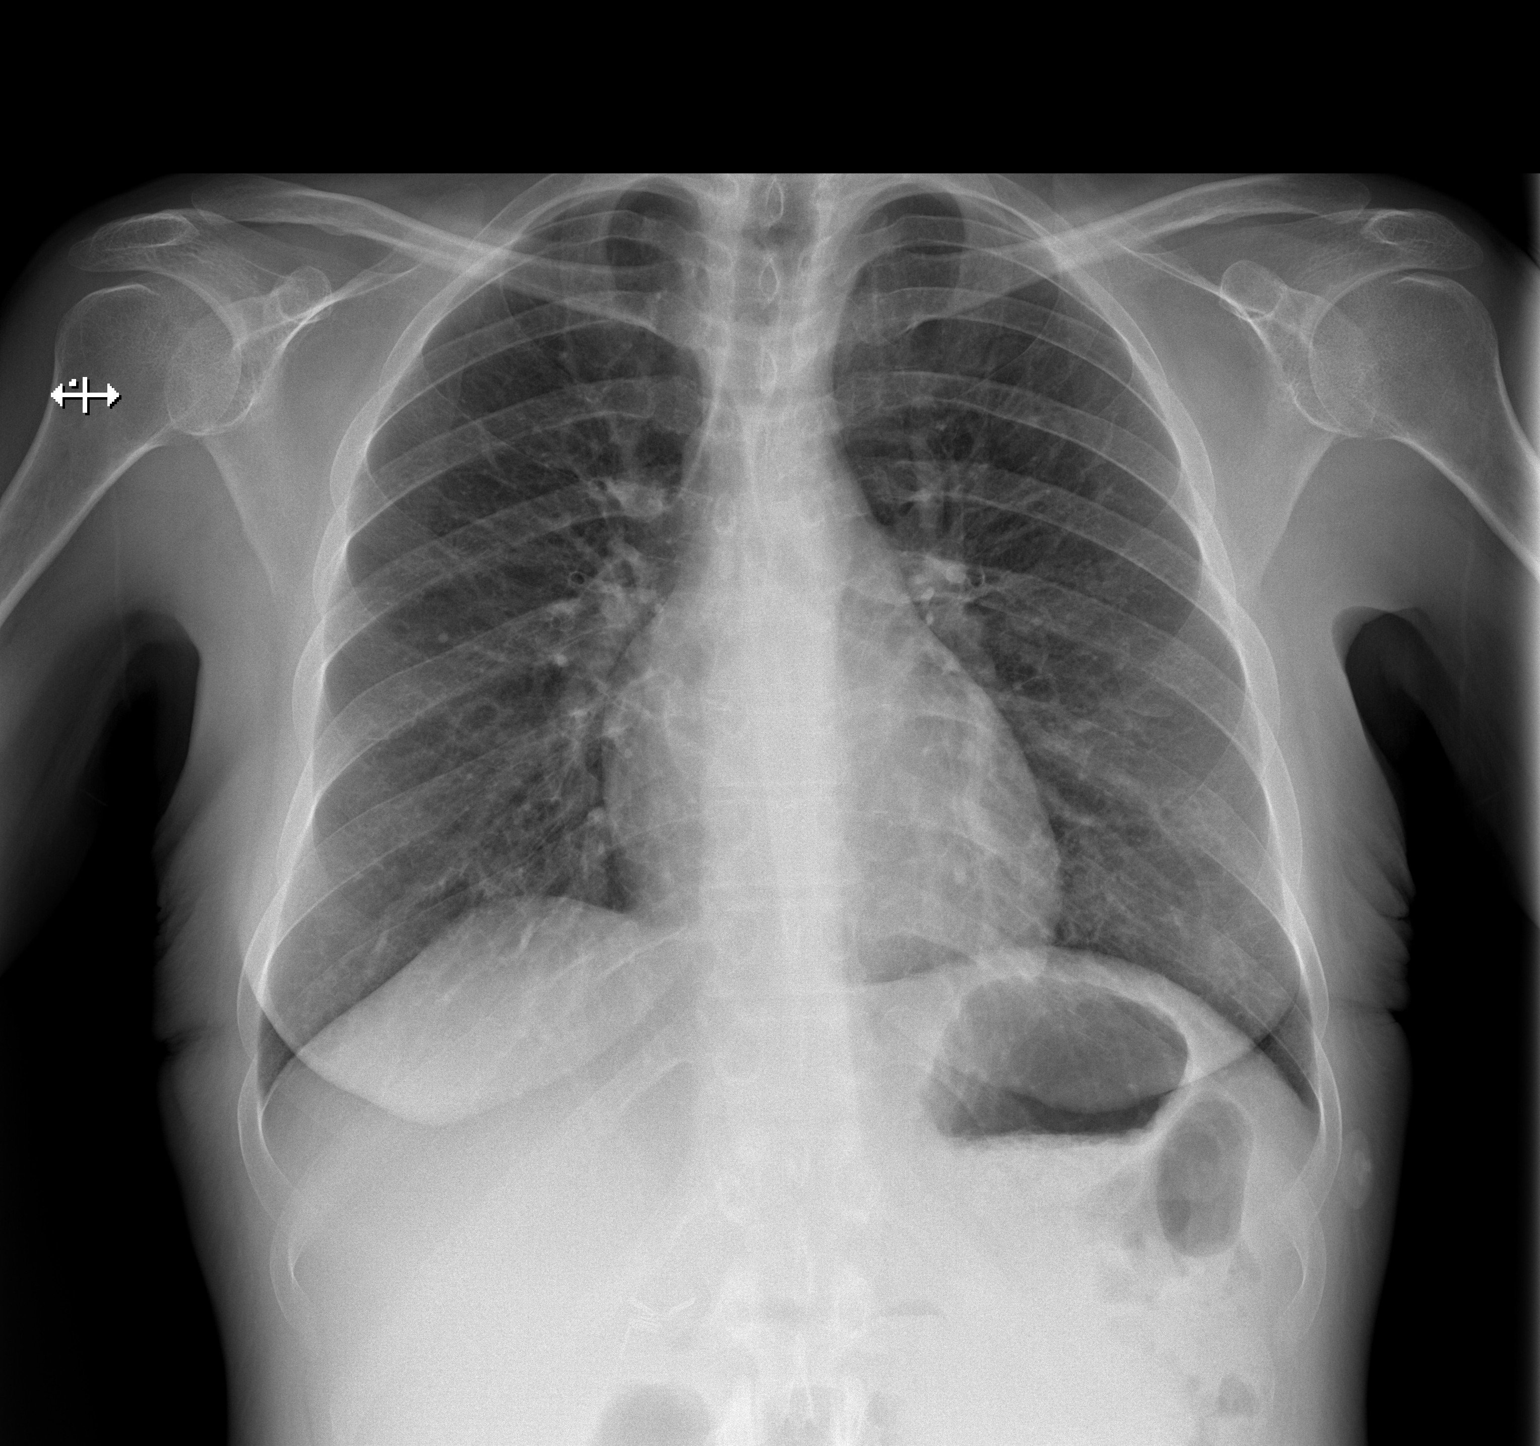

[w chest lat]
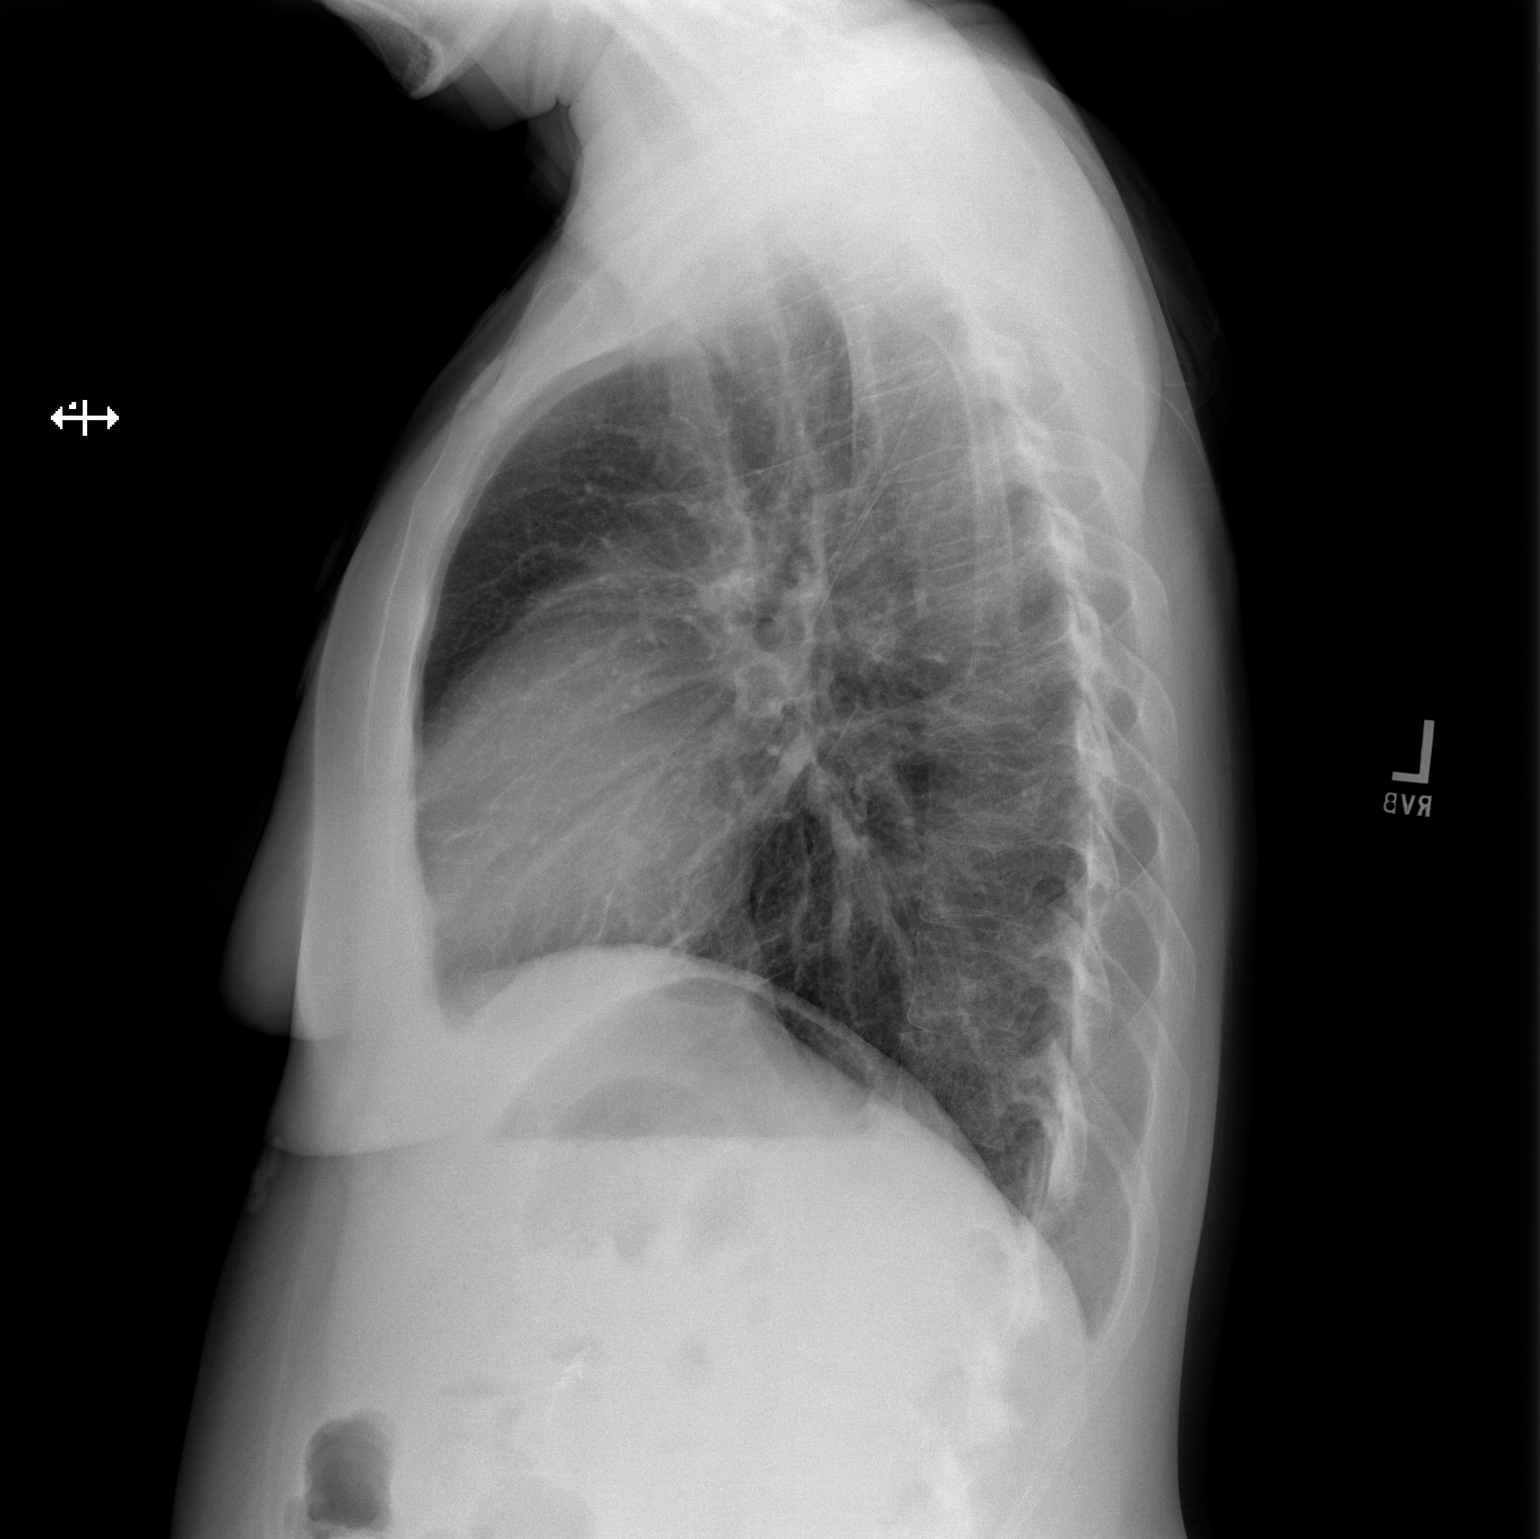

[2 of 2 positions shown; findings below may reference images not displayed]

FINDINGS: Trachea is midline. Heart size normal. Lungs are mildly
hyperinflated but clear. No pleural fluid.
IMPRESSION: No acute findings.

## 2017-04-06 NOTE — ED Notes (Signed)
Portable EKG completed during triage in lobby cubby.

## 2017-04-06 NOTE — ED Notes (Signed)
First attempt for labs. PT currently not in waiting area

## 2017-04-06 NOTE — ED Triage Notes (Signed)
Patient reports having a perirectal abscess that was recently drained and was placed on antibiotics but its still having drainage. Beginning yesterday morning having lower back pain radiating into bilateral legs. Pt adds starting last night having chest pain across the top of her chest.

## 2017-04-06 NOTE — ED Notes (Signed)
Dylan, NT called patient for vital sign check with no answer.

## 2017-04-06 NOTE — ED Notes (Signed)
Pt called from the lobby with no response x3 

## 2017-04-07 ENCOUNTER — Ambulatory Visit (INDEPENDENT_AMBULATORY_CARE_PROVIDER_SITE_OTHER): Payer: 59 | Admitting: Endocrinology

## 2017-04-07 ENCOUNTER — Encounter: Payer: Self-pay | Admitting: Endocrinology

## 2017-04-07 VITALS — BP 152/92 | HR 98 | Wt 149.0 lb

## 2017-04-07 DIAGNOSIS — E1043 Type 1 diabetes mellitus with diabetic autonomic (poly)neuropathy: Secondary | ICD-10-CM

## 2017-04-07 DIAGNOSIS — E10649 Type 1 diabetes mellitus with hypoglycemia without coma: Secondary | ICD-10-CM | POA: Diagnosis not present

## 2017-04-07 MED ORDER — LANTUS 100 UNIT/ML ~~LOC~~ SOLN: 20.0000 [IU] | SUBCUTANEOUS | 11 refills | Status: DC

## 2017-04-07 NOTE — Patient Instructions (Addendum)
good diet and exercise significantly improve the control of your diabetes.  please let me know if you wish to be referred to a dietician.  high blood sugar is very risky to your health.  you should see an eye doctor and dentist every year.  It is very important to get all recommended vaccinations.  Controlling your blood pressure and cholesterol drastically reduces the damage diabetes does to your body.  Those who smoke should quit.  Please discuss these with your doctor.  check your blood sugar twice a day.  vary the time of day when you check, between before the 3 meals, and at bedtime.  also check if you have symptoms of your blood sugar being too high or too low.  please keep a record of the readings and bring it to your next appointment here (or you can bring the meter itself).  You can write it on any piece of paper.  please call us sooner if your blood sugar goes below 70, or if you have a lot of readings over 200. blood tests are requested for you today.  We'll let you know about the results.  Based on the results, we'll try to change the humalog to a fixed amount with meals.   To make it easier, change the lantus to 20 units daily, all in the morning.  Please come back for a follow-up appointment in 1 month.

## 2017-04-07 NOTE — Progress Notes (Signed)
Subjective:    Patient ID: [name], female    DOB: 02/14/1991, 27 y.o.   MRN: [id number]  HPI pt is referred by Hetty Blend, NP, for diabetes.  Pt states DM was dx'ed in 2002; she has mild if any neuropathy of the lower extremities, and associated gastroparesis; she has been on insulin since dx; pt says her diet is good, but exercise is poor; she has never had GDM, pancreatitis, pancreatic surgery.  Last episode of severe hypoglycemia was in 2018.  Last episode of DKA was also in 2018.  Pt says she has been missing insulin, due to her work schedule.  She takes lantus 10 units BID, and prn humalog (averages a total of approx 7 units per day).  Pt says cbg's have been well-controlled, since she has not been working (1 month).  She says she plans to not resume working.   Past Medical History:  Diagnosis Date  . Anxiety   . Crohn's disease (HCC)   . Diabetes type 1, uncontrolled (HCC) 11/14/2011   Since age 58  . Fibromyalgia   . Gastroparesis   . Hypertension   . Infection    UTI April 2016    Past Surgical History:  Procedure Laterality Date  . ANKLE SURGERY    . CHOLECYSTECTOMY  11/15/2011   Procedure: LAPAROSCOPIC CHOLECYSTECTOMY WITH INTRAOPERATIVE CHOLANGIOGRAM;  Surgeon: [name], MD;  Location: [facility];  Service: General;  Laterality: N/A;  . ESOPHAGOGASTRODUODENOSCOPY  12/03/2011   Procedure: ESOPHAGOGASTRODUODENOSCOPY (EGD);  Surgeon: Theda Belfast, MD;  Location: [facility];  Service: Endoscopy;  Laterality: N/A;  . FLEXIBLE SIGMOIDOSCOPY N/A 03/10/2017   Procedure: FLEXIBLE SIGMOIDOSCOPY;  Surgeon: Jeani Hawking, MD;  Location: [facility];  Service: Endoscopy;  Laterality: N/A;  . INCISION AND DRAINAGE PERIRECTAL ABSCESS N/A 03/01/2017   Procedure: IRRIGATION AND DEBRIDEMENT PERIRECTAL ABSCESS;  Surgeon: Ovidio Kin, MD;  Location: [facility];  Service: General;  Laterality: N/A;  . IRRIGATION AND DEBRIDEMENT BUTTOCKS N/A 03/23/2017   Procedure: IRRIGATION AND  DEBRIDEMENT BUTTOCKS, SETON PLACEMENT;  Surgeon: Romie Levee, MD;  Location: [facility];  Service: General;  Laterality: N/A;  . LAPAROSCOPY  11/23/2011   Procedure: LAPAROSCOPY DIAGNOSTIC;  Surgeon: Mariella Saa, MD;  Location: [facility];  Service: General;  Laterality: N/A;    Social History   Socioeconomic History  . Marital status: Married    Spouse name: sergio  . Number of children: 0  . Years of education: college  . Highest education level: Not on file  Social Needs  . Financial resource strain: Not on file  . Food insecurity - worry: Not on file  . Food insecurity - inability: Not on file  . Transportation needs - medical: Not on file  . Transportation needs - non-medical: Not on file  Occupational History  . Occupation: [employer] call center    Employer: [employer]  Tobacco Use  . Smoking status: Never Smoker  . Smokeless tobacco: Never Used  Substance and Sexual Activity  . Alcohol use: Yes    Alcohol/week: 0.0 oz    Comment: Once every couple of months   . Drug use: No    Comment: Previous use  . Sexual activity: Not Currently    Partners: Male    Birth control/protection: None  Other Topics Concern  . Not on file  Social History Narrative  . Not on file    Current Outpatient Medications on File Prior to Visit  Medication Sig Dispense Refill  .  cholestyramine light (PREVALITE) 4 g packet Take 1 packet (4 g total) by mouth 3 (three) times daily. 180 packet 0  . citalopram (CELEXA) 20 MG tablet Take 1 tablet (20 mg total) by mouth daily. 30 tablet 0  . EPINEPHrine (EPIPEN 2-PAK) 0.3 mg/0.3 mL IJ SOAJ injection Inject 0.3 mLs (0.3 mg total) into the muscle as needed (allergic reaction).    . feeding supplement, GLUCERNA SHAKE, (GLUCERNA SHAKE) LIQD Take 237 mLs by mouth 2 (two) times daily between meals. 30 Can 0  . ferrous sulfate 325 (65 FE) MG tablet Take 1 tablet (325 mg total) by mouth 2 (two) times daily with a meal. 60 tablet 0  . folic  acid (FOLVITE) 1 MG tablet Take 1 tablet (1 mg total) by mouth daily. 30 tablet 0  . furosemide (LASIX) 40 MG tablet Take 1 tablet (40 mg total) by mouth daily. 30 tablet 0  . gabapentin (NEURONTIN) 300 MG capsule Take 1 capsule (300 mg total) by mouth 2 (two) times daily. 30 capsule 2  . glucose 4 GM chewable tablet Chew 1 tablet (4 g total) by mouth as needed for low blood sugar. 50 tablet 12  . Ibuprofen (MIDOL) 200 MG CAPS Take 400 mg by mouth 2 (two) times daily as needed (pain).     . insulin lispro (HUMALOG) 100 UNIT/ML injection BG 70 - 120: 0 units BG 121 - 150: 1 unit BG 151 - 200: 2 units BG 201 - 250: 3 units BG 251 - 300: 5 units BG 301 - 350: 7 units BG 351 - 400: 9 units (Patient taking differently: Inject 0-9 Units into the skin 3 (three) times daily with meals. BG 70 - 120: 0 units BG 121 - 150: 1 unit BG 151 - 200: 2 units BG 201 - 250: 3 units BG 251 - 300: 5 units BG 301 - 350: 7 units BG 351 - 400: 9 units) 10 mL 11  . lipase/protease/amylase (CREON) 36000 UNITS CPEP capsule Take 1 capsule (36,000 Units total) by mouth 3 (three) times daily with meals. 270 capsule 0  . loperamide (IMODIUM) 2 MG capsule Take 1 capsule (2 mg total) by mouth 3 (three) times daily before meals. DO NOT TAKE IT FOR THE DAY YOU DONT HAVE A BOWEL MOVEMENT. 30 capsule 2  . methocarbamol (ROBAXIN) 500 MG tablet Take 1 tablet (500 mg total) by mouth every 8 (eight) hours as needed for muscle spasms. 30 tablet 0  . metoCLOPramide (REGLAN) 5 MG tablet Take 5 mg by mouth 4 (four) times daily -  before meals and at bedtime.    . traMADol (ULTRAM) 50 MG tablet Take 50 mg by mouth daily as needed for pain.    Marland Kitchen albuterol (PROVENTIL HFA;VENTOLIN HFA) 108 (90 Base) MCG/ACT inhaler Inhale 1-2 puffs into the lungs every 4 (four) hours as needed for wheezing or shortness of breath. (Patient not taking: Reported on 04/07/2017) 1 Inhaler 0   No current facility-administered medications on file prior to visit.      Allergies  Allergen Reactions  . Peanut-Containing Drug Products Anaphylaxis and Other (See Comments)    Pt states that she is allergic to [place] nuts; can tolerate peanuts  . Lactose Intolerance (Gi) Other (See Comments)    Gi upset     Family History  Problem Relation Age of Onset  . Diabetes Mother   . Hypertension Father     BP (!) 152/92 (BP Location: Left Arm, Patient Position: Sitting, Cuff Size:  Normal)   Pulse 98   Wt 149 lb (67.6 kg)   SpO2 99%   BMI 23.34 kg/m    Review of Systems denies blurry vision, headache, chest pain, sob, n/v, urinary frequency, muscle cramps, excessive diaphoresis, memory loss, rhinorrhea, and easy bruising.  She has regained a few lbs recently.  She has anxiety, cold intolerance, and depression.      Objective:   Physical Exam VS: see vs page GEN: no distress HEAD: head: no deformity eyes: no periorbital swelling, no proptosis external nose and ears are normal mouth: no lesion seen NECK: supple, thyroid is not enlarged CHEST WALL: no deformity LUNGS: clear to auscultation CV: reg rate and rhythm, no murmur ABD: abdomen is soft, nontender.  no hepatosplenomegaly.  not distended.  no hernia MUSCULOSKELETAL: muscle bulk and strength are grossly normal.  no obvious joint swelling.  gait is normal and steady.  EXTEMITIES: no deformity.  no ulcer on the feet.  feet are of normal color and temp.  2+ bilat leg edema.  PULSES: dorsalis pedis intact bilat.  no carotid bruit NEURO:  cn 2-12 grossly intact.   readily moves all 4's.  sensation is intact to touch on the feet.  SKIN:  Normal texture and temperature.  No rash or suspicious lesion is visible.   NODES:  None palpable at the neck PSYCH: alert, well-oriented.  Does not appear anxious nor depressed.  Lab Results  Component Value Date   HGBA1C 12.2 (H) 02/27/2017   I have reviewed outside records, and summarized: Pt was noted to have severely elevated a1c, and referred here.   She was noted to have noncompliance with rx, leading up to recent hospitalization  I personally reviewed electrocardiogram tracing (03/15/17): Indication: diarrhea Impression: ST.  No MI.  Low voltage Compared to 02/26/17: no significant change     Assessment & Plan:  Type 1 DM, with gastroparesis: severe exacerbation Noncompliance with insulin: this raises the question of whether she should continue multiple daily injections.  we'll check fructosamine.  If it is very high, we'll change to lantus only. If not, we'll decide on a fixed dosage of humalog Depression: I agree with the aggressive rx of this, as doing so helps compliance  Patient Instructions  good diet and exercise significantly improve the control of your diabetes.  please let me know if you wish to be referred to a dietician.  high blood sugar is very risky to your health.  you should see an eye doctor and dentist every year.  It is very important to get all recommended vaccinations.  Controlling your blood pressure and cholesterol drastically reduces the damage diabetes does to your body.  Those who smoke should quit.  Please discuss these with your doctor.  check your blood sugar twice a day.  vary the time of day when you check, between before the 3 meals, and at bedtime.  also check if you have symptoms of your blood sugar being too high or too low.  please keep a record of the readings and bring it to your next appointment here (or you can bring the meter itself).  You can write it on any piece of paper.  please call us sooner if your blood sugar goes below 70, or if you have a lot of readings over 200. blood tests are requested for you today.  We'll let you know about the results.  Based on the results, we'll try to change the humalog to a fixed amount with meals.  To make it easier, change the lantus to 20 units daily, all in the morning.  Please come back for a follow-up appointment in 1 month.

## 2017-04-08 ENCOUNTER — Telehealth: Payer: Self-pay | Admitting: Family Medicine

## 2017-04-08 ENCOUNTER — Other Ambulatory Visit: Payer: Self-pay

## 2017-04-08 MED ORDER — DIPHENOXYLATE-ATROPINE 2.5-0.025 MG PO TABS: 2.0000 | ORAL_TABLET | Freq: Four times a day (QID) | ORAL | 0 refills | Status: DC

## 2017-04-08 NOTE — Telephone Encounter (Signed)
Pt called and left message on voice mail that she needed Lamotil refilled and she also states [place] is not going to give her any more pain meds and she has had some uncontrollable pain and wants to know if you would refill her Oxycodone 325.  Pt ph [phone]

## 2017-04-08 NOTE — Telephone Encounter (Signed)
Called and notified patient of the note. No other questions at this time. Will submit RX.

## 2017-04-08 NOTE — Telephone Encounter (Signed)
Ok to refill lamotil but I cannot refill her oxycodone. Discuss pain control with her surgeon please. If she is still having that much pain from her procedure then they may need to see them.

## 2017-04-08 NOTE — Telephone Encounter (Signed)
Called patient and advised of message, submitted the RX for medication. Patient understood and had no questions.

## 2017-04-09 ENCOUNTER — Other Ambulatory Visit: Payer: Self-pay | Admitting: Endocrinology

## 2017-04-09 ENCOUNTER — Inpatient Hospital Stay (HOSPITAL_COMMUNITY)
Admission: EM | Admit: 2017-04-09 | Discharge: 2017-04-16 | DRG: 073 | Disposition: A | Discharge status: Home / Self Care | Payer: 59 | Attending: Internal Medicine | Admitting: Internal Medicine

## 2017-04-09 ENCOUNTER — Encounter (HOSPITAL_COMMUNITY): Payer: Self-pay | Admitting: Emergency Medicine

## 2017-04-09 ENCOUNTER — Other Ambulatory Visit: Payer: Self-pay

## 2017-04-09 DIAGNOSIS — R197 Diarrhea, unspecified: Secondary | ICD-10-CM | POA: Diagnosis not present

## 2017-04-09 DIAGNOSIS — E1040 Type 1 diabetes mellitus with diabetic neuropathy, unspecified: Secondary | ICD-10-CM | POA: Diagnosis present

## 2017-04-09 DIAGNOSIS — M549 Dorsalgia, unspecified: Secondary | ICD-10-CM

## 2017-04-09 DIAGNOSIS — IMO0001 Reserved for inherently not codable concepts without codable children: Secondary | ICD-10-CM | POA: Diagnosis present

## 2017-04-09 DIAGNOSIS — Z91018 Allergy to other foods: Secondary | ICD-10-CM

## 2017-04-09 DIAGNOSIS — R1110 Vomiting, unspecified: Secondary | ICD-10-CM | POA: Diagnosis not present

## 2017-04-09 DIAGNOSIS — K529 Noninfective gastroenteritis and colitis, unspecified: Secondary | ICD-10-CM | POA: Diagnosis not present

## 2017-04-09 DIAGNOSIS — E1069 Type 1 diabetes mellitus with other specified complication: Secondary | ICD-10-CM | POA: Diagnosis present

## 2017-04-09 DIAGNOSIS — L0291 Cutaneous abscess, unspecified: Secondary | ICD-10-CM

## 2017-04-09 DIAGNOSIS — E10649 Type 1 diabetes mellitus with hypoglycemia without coma: Secondary | ICD-10-CM | POA: Diagnosis not present

## 2017-04-09 DIAGNOSIS — R112 Nausea with vomiting, unspecified: Secondary | ICD-10-CM | POA: Diagnosis not present

## 2017-04-09 DIAGNOSIS — Z9119 Patient's noncompliance with other medical treatment and regimen: Secondary | ICD-10-CM

## 2017-04-09 DIAGNOSIS — I10 Essential (primary) hypertension: Secondary | ICD-10-CM

## 2017-04-09 DIAGNOSIS — M545 Low back pain: Secondary | ICD-10-CM | POA: Diagnosis present

## 2017-04-09 DIAGNOSIS — IMO0002 Reserved for concepts with insufficient information to code with codable children: Secondary | ICD-10-CM | POA: Diagnosis present

## 2017-04-09 DIAGNOSIS — L03317 Cellulitis of buttock: Secondary | ICD-10-CM | POA: Diagnosis present

## 2017-04-09 DIAGNOSIS — Z794 Long term (current) use of insulin: Secondary | ICD-10-CM

## 2017-04-09 DIAGNOSIS — E43 Unspecified severe protein-calorie malnutrition: Secondary | ICD-10-CM | POA: Diagnosis present

## 2017-04-09 DIAGNOSIS — E1043 Type 1 diabetes mellitus with diabetic autonomic (poly)neuropathy: Principal | ICD-10-CM | POA: Diagnosis present

## 2017-04-09 DIAGNOSIS — K5090 Crohn's disease, unspecified, without complications: Secondary | ICD-10-CM | POA: Diagnosis present

## 2017-04-09 DIAGNOSIS — Z9049 Acquired absence of other specified parts of digestive tract: Secondary | ICD-10-CM

## 2017-04-09 DIAGNOSIS — R109 Unspecified abdominal pain: Secondary | ICD-10-CM | POA: Diagnosis not present

## 2017-04-09 DIAGNOSIS — F329 Major depressive disorder, single episode, unspecified: Secondary | ICD-10-CM | POA: Diagnosis present

## 2017-04-09 DIAGNOSIS — E1165 Type 2 diabetes mellitus with hyperglycemia: Secondary | ICD-10-CM | POA: Diagnosis present

## 2017-04-09 DIAGNOSIS — G8929 Other chronic pain: Secondary | ICD-10-CM | POA: Diagnosis present

## 2017-04-09 DIAGNOSIS — Z6822 Body mass index (BMI) 22.0-22.9, adult: Secondary | ICD-10-CM

## 2017-04-09 DIAGNOSIS — E119 Type 2 diabetes mellitus without complications: Secondary | ICD-10-CM

## 2017-04-09 DIAGNOSIS — Z79899 Other long term (current) drug therapy: Secondary | ICD-10-CM

## 2017-04-09 DIAGNOSIS — L0231 Cutaneous abscess of buttock: Secondary | ICD-10-CM | POA: Diagnosis present

## 2017-04-09 DIAGNOSIS — E739 Lactose intolerance, unspecified: Secondary | ICD-10-CM | POA: Diagnosis present

## 2017-04-09 DIAGNOSIS — R188 Other ascites: Secondary | ICD-10-CM | POA: Diagnosis present

## 2017-04-09 DIAGNOSIS — Z833 Family history of diabetes mellitus: Secondary | ICD-10-CM

## 2017-04-09 DIAGNOSIS — I152 Hypertension secondary to endocrine disorders: Secondary | ICD-10-CM | POA: Diagnosis present

## 2017-04-09 DIAGNOSIS — E1159 Type 2 diabetes mellitus with other circulatory complications: Secondary | ICD-10-CM | POA: Diagnosis present

## 2017-04-09 DIAGNOSIS — M797 Fibromyalgia: Secondary | ICD-10-CM | POA: Diagnosis present

## 2017-04-09 DIAGNOSIS — F419 Anxiety disorder, unspecified: Secondary | ICD-10-CM | POA: Diagnosis present

## 2017-04-09 DIAGNOSIS — D509 Iron deficiency anemia, unspecified: Secondary | ICD-10-CM | POA: Diagnosis present

## 2017-04-09 DIAGNOSIS — K3184 Gastroparesis: Secondary | ICD-10-CM | POA: Diagnosis present

## 2017-04-09 DIAGNOSIS — E1142 Type 2 diabetes mellitus with diabetic polyneuropathy: Secondary | ICD-10-CM | POA: Diagnosis present

## 2017-04-09 LAB — COMPREHENSIVE METABOLIC PANEL
ALT: 21 U/L (ref 14–54)
AST: 27 U/L (ref 15–41)
Albumin: 3.1 g/dL — ABNORMAL LOW (ref 3.5–5.0)
Alkaline Phosphatase: 79 U/L (ref 38–126)
Anion gap: 5 (ref 5–15)
BUN: 9 mg/dL (ref 6–20)
CO2: 28 mmol/L (ref 22–32)
Calcium: 8.8 mg/dL — ABNORMAL LOW (ref 8.9–10.3)
Chloride: 104 mmol/L (ref 101–111)
Creatinine, Ser: 0.36 mg/dL — ABNORMAL LOW (ref 0.44–1.00)
GFR calc Af Amer: >60 mL/min (ref >60)
GFR calc non Af Amer: >60 mL/min (ref >60)
Glucose, Bld: 126 mg/dL — ABNORMAL HIGH (ref 65–99)
Potassium: 4 mmol/L (ref 3.5–5.1)
Sodium: 137 mmol/L (ref 135–145)
Total Bilirubin: 0.5 mg/dL (ref 0.3–1.2)
Total Protein: 8.1 g/dL (ref 6.5–8.1)

## 2017-04-09 LAB — LIPASE, BLOOD: Lipase: 14 U/L (ref 11–51)

## 2017-04-09 LAB — CBC
HCT: 33 % — ABNORMAL LOW (ref 36.0–46.0)
Hemoglobin: 10.6 g/dL — ABNORMAL LOW (ref 12.0–15.0)
MCH: 29 pg (ref 26.0–34.0)
MCHC: 32.1 g/dL (ref 30.0–36.0)
MCV: 90.4 fL (ref 78.0–100.0)
Platelets: 237 10*3/uL (ref 150–400)
RBC: 3.65 MIL/uL — ABNORMAL LOW (ref 3.87–5.11)
RDW: 12.7 % (ref 11.5–15.5)
WBC: 4.8 10*3/uL (ref 4.0–10.5)

## 2017-04-09 LAB — FRUCTOSAMINE: Fructosamine: 295 umol/L — ABNORMAL HIGH (ref 190–270)

## 2017-04-09 LAB — CBG MONITORING, ED: Glucose-Capillary: 192 mg/dL — ABNORMAL HIGH (ref 65–99)

## 2017-04-09 LAB — I-STAT BETA HCG BLOOD, ED (MC, WL, AP ONLY): I-stat hCG, quantitative: <5 m[IU]/mL (ref <5)

## 2017-04-09 MED ORDER — HYDROMORPHONE HCL 1 MG/ML IJ SOLN
1.0000 mg | Freq: Once | INTRAMUSCULAR | Status: AC
  Administered 2017-04-10: 1 mg via INTRAVENOUS
  Filled 2017-04-09: qty 1

## 2017-04-09 MED ORDER — INSULIN LISPRO 100 UNIT/ML ~~LOC~~ SOLN: 2.0000 [IU] | Freq: Three times a day (TID) | SUBCUTANEOUS | 11 refills | Status: DC

## 2017-04-09 MED ORDER — SODIUM CHLORIDE 0.9 % IV BOLUS (SEPSIS)
1000.0000 mL | Freq: Once | INTRAVENOUS | Status: AC
  Administered 2017-04-10: 1000 mL via INTRAVENOUS

## 2017-04-09 MED ORDER — PROMETHAZINE HCL 25 MG/ML IJ SOLN
25.0000 mg | Freq: Once | INTRAMUSCULAR | Status: AC
  Administered 2017-04-10: 25 mg via INTRAVENOUS
  Filled 2017-04-09: qty 1

## 2017-04-09 MED ORDER — ONDANSETRON 4 MG PO TBDP
4.0000 mg | ORAL_TABLET | Freq: Once | ORAL | Status: AC | PRN
  Administered 2017-04-09: 4 mg via ORAL
  Filled 2017-04-09: qty 1

## 2017-04-09 MED ORDER — IOPAMIDOL (ISOVUE-300) INJECTION 61%
INTRAVENOUS | Status: AC
  Administered 2017-04-10: 100 mL via INTRAVENOUS
  Filled 2017-04-09: qty 100

## 2017-04-09 NOTE — ED Triage Notes (Signed)
Pt complaint abdominal pain with n/v/d onset throughout the night; hx of gastroparesis and Crohn's.

## 2017-04-09 NOTE — ED Notes (Signed)
Pt is aware a urine sample is needed, but is unable to provide one at this time. Pt was given urine cup in lobby.

## 2017-04-09 NOTE — ED Provider Notes (Signed)
TIME SEEN: 11:30 PM  CHIEF COMPLAINT: Abdominal pain, nausea, vomiting, diarrhea  HPI: Patient is a 27 year old female with history of insulin-dependent diabetes, gastroparesis, Crohn's disease, hypertension who presents to the emergency department with complaints of diffuse severe abdominal pain that started yesterday with nausea, vomiting and diarrhea.  Reports diarrhea is normal for her Crohn's disease but nausea and vomiting is not.  States this does not feel like her gastroparesis.  She has had previous cholecystectomy and previous paracentesis x2.  States she has had ascites secondary to malnutrition from her Crohn's and gastroparesis.  Her PCP is Dr. Raenette Rover at [facility] and her gastroenterologist is Dr. Collene Mares.  She has also recently had a right gluteal abscess with fistula to a per-rectal abscess that was incised and drained by Dr. Marcello Moores on 03/23/17 and a Penrose drain was placed.  Drain still in place.  No rectal pain currently.  Actively vomiting in the room.  Denies fever.  Denies sick contacts.  ROS: See HPI Constitutional: no fever  Eyes: no drainage  ENT: no runny nose   Cardiovascular:  no chest pain  Resp: no SOB  GI: Diarrhea and vomiting GU: no dysuria Integumentary: no rash  Allergy: no hives  Musculoskeletal: no leg swelling  Neurological: no slurred speech ROS otherwise negative  PAST MEDICAL HISTORY/PAST SURGICAL HISTORY:  Past Medical History:  Diagnosis Date  . Anxiety   . Crohn's disease ([place])   . Diabetes type 1, uncontrolled ([place]) 11/14/2011   Since age 71  . Fibromyalgia   . Gastroparesis   . Hypertension   . Infection    UTI April 2016    MEDICATIONS:  Prior to Admission medications   Medication Sig Start Date End Date Taking? Authorizing Provider  albuterol (PROVENTIL HFA;VENTOLIN HFA) 108 (90 Base) MCG/ACT inhaler Inhale 1-2 puffs into the lungs every 4 (four) hours as needed for wheezing or shortness of breath. Patient not taking:  Reported on 04/07/2017 09/02/16   Street, [place], PA-C  cholestyramine light (PREVALITE) 4 g packet Take 1 packet (4 g total) by mouth 3 (three) times daily. 03/14/17   Lavina Hamman, MD  citalopram (CELEXA) 20 MG tablet Take 1 tablet (20 mg total) by mouth daily. 03/15/17   Lavina Hamman, MD  diphenoxylate-atropine (LOMOTIL) 2.5-0.025 MG tablet Take 2 tablets by mouth 4 (four) times daily. 04/08/17   Henson, Vickie L, NP-C  EPINEPHrine (EPIPEN 2-PAK) 0.3 mg/0.3 mL IJ SOAJ injection Inject 0.3 mLs (0.3 mg total) into the muscle as needed (allergic reaction). 03/21/17   Aline August, MD  feeding supplement, GLUCERNA SHAKE, (GLUCERNA SHAKE) LIQD Take 237 mLs by mouth 2 (two) times daily between meals. 03/14/17   Lavina Hamman, MD  ferrous sulfate 325 (65 FE) MG tablet Take 1 tablet (325 mg total) by mouth 2 (two) times daily with a meal. 03/14/17   Lavina Hamman, MD  folic acid (FOLVITE) 1 MG tablet Take 1 tablet (1 mg total) by mouth daily. 03/15/17   Lavina Hamman, MD  furosemide (LASIX) 40 MG tablet Take 1 tablet (40 mg total) by mouth daily. 03/21/17 04/20/17  Aline August, MD  gabapentin (NEURONTIN) 300 MG capsule Take 1 capsule (300 mg total) by mouth 2 (two) times daily. 04/03/17   Henson, Vickie L, NP-C  glucose 4 GM chewable tablet Chew 1 tablet (4 g total) by mouth as needed for low blood sugar. 08/10/14   Kandis Cocking A, CNM  Ibuprofen (MIDOL) 200 MG CAPS Take 400  mg by mouth 2 (two) times daily as needed (pain).     [provider]  insulin lispro (HUMALOG) 100 UNIT/ML injection Inject 0.02 mLs (2 Units total) into the skin 3 (three) times daily with meals. 04/09/17   Renato Shin, MD  LANTUS 100 UNIT/ML injection Inject 0.2 mLs (20 Units total) into the skin every morning. 04/07/17   Renato Shin, MD  lipase/protease/amylase (CREON) 36000 UNITS CPEP capsule Take 1 capsule (36,000 Units total) by mouth 3 (three) times daily with meals. 03/14/17   Lavina Hamman, MD   loperamide (IMODIUM) 2 MG capsule Take 1 capsule (2 mg total) by mouth 3 (three) times daily before meals. DO NOT TAKE IT FOR THE DAY YOU DONT HAVE A BOWEL MOVEMENT. 04/03/17   Henson, Vickie L, NP-C  methocarbamol (ROBAXIN) 500 MG tablet Take 1 tablet (500 mg total) by mouth every 8 (eight) hours as needed for muscle spasms. 03/14/17   Lavina Hamman, MD  metoCLOPramide (REGLAN) 5 MG tablet Take 5 mg by mouth 4 (four) times daily -  before meals and at bedtime.    [provider]    ALLERGIES:  Allergies  Allergen Reactions  . Peanut-Containing Drug Products Anaphylaxis and Other (See Comments)    Pt states that she is allergic to [place] nuts; can tolerate peanuts  . Lactose Intolerance (Gi) Other (See Comments)    Gi upset     SOCIAL HISTORY:  Social History   Tobacco Use  . Smoking status: Never Smoker  . Smokeless tobacco: Never Used  Substance Use Topics  . Alcohol use: Yes    Alcohol/week: 0.0 oz    Comment: Once every couple of months     FAMILY HISTORY: Family History  Problem Relation Age of Onset  . Diabetes Mother   . Hypertension Father     EXAM: BP (!) 184/121 (BP Location: Right Arm)   Pulse (!) 105   Temp 98.2 F (36.8 C) (Oral)   Resp (!) 24   Ht 5' 7"  (1.702 m)   Wt 68 kg (150 lb)   SpO2 100%   BMI 23.49 kg/m  CONSTITUTIONAL: Alert and oriented and responds appropriately to questions.  Likely ill-appearing, appears very uncomfortable, hypertensive, actively vomiting, afebrile and nontoxic HEAD: Normocephalic EYES: Conjunctivae clear, pupils appear equal, EOMI ENT: normal nose; moist mucous membranes NECK: Supple, no meningismus, no nuchal rigidity, no LAD  CARD: Regular and minimally tachycardic; S1 and S2 appreciated; no murmurs, no clicks, no rubs, no gallops RESP: Normal chest excursion without splinting or tachypnea; breath sounds clear and equal bilaterally; no wheezes, no rhonchi, no rales, no hypoxia or respiratory distress, speaking  full sentences ABD/GI: Normal bowel sounds; non-distended; soft, tender to palpation diffusely throughout her abdomen, no rebound, no guarding, no peritoneal signs, no hepatosplenomegaly BUTTOCK: Patient has a large open wound to the right gluteal tissue just next to the gluteal cleft with some purulent drainage and a Penrose drain in place but no sign of surrounding erythema, warmth, fluctuance or induration. BACK:  The back appears normal and is non-tender to palpation, there is no CVA tenderness EXT: Normal ROM in all joints; non-tender to palpation; no edema; normal capillary refill; no cyanosis, no calf tenderness or swelling    SKIN: Normal color for age and race; warm; no rash NEURO: Moves all extremities equally PSYCH: The patient's mood and manner are appropriate. Grooming and personal hygiene are appropriate.  MEDICAL DECISION MAKING: Patient here with active vomiting, diarrhea.  Appears  very uncomfortable exam with hypertension, tachycardia.  Will give pain and nausea medicine.  Labs are reassuring.  Will recheck her blood glucose given she is on insulin here.  No sign of DKA.  She has had several recent CT scans but has had multiple complications.  At this time given how tender she is and how uncomfortable she appears I feel will need a repeat CT scan for further evaluation.  Differential includes colitis, bowel obstruction, appendicitis, diverticulitis.  She has had a cholecystectomy.  She does not appear to have significant ascites on her abdomen currently.  Low suspicion for SBP.  ED PROGRESS: Patient CT scan concerning for colitis.  We will treat for both possible autoimmune colitis as well as infectious colitis.  Will give steroids and Zosyn.  I feel she will need admission.  Patient continues to vomit.  Will give dose of Reglan.  Will discuss with medicine for admission.    4:20 AM Discussed patient's case with hospitalist, Dr. Alcario Drought.  I have recommended admission and patient (and  family if present) agree with this plan. Admitting physician will place admission orders.   I reviewed all nursing notes, vitals, pertinent previous records, EKGs, lab and urine results, imaging (as available).     Ward, Delice Bison, DO 04/10/17 [phone]

## 2017-04-09 NOTE — ED Notes (Signed)
Pt states to [facility] while obtaining vital signs, that her nausea and pain is getting worse. Pt has been vomiting in lobby.

## 2017-04-10 ENCOUNTER — Emergency Department (HOSPITAL_COMMUNITY): Payer: 59

## 2017-04-10 DIAGNOSIS — L0231 Cutaneous abscess of buttock: Secondary | ICD-10-CM

## 2017-04-10 DIAGNOSIS — K3184 Gastroparesis: Secondary | ICD-10-CM

## 2017-04-10 DIAGNOSIS — K529 Noninfective gastroenteritis and colitis, unspecified: Secondary | ICD-10-CM | POA: Diagnosis not present

## 2017-04-10 DIAGNOSIS — R112 Nausea with vomiting, unspecified: Secondary | ICD-10-CM | POA: Diagnosis not present

## 2017-04-10 DIAGNOSIS — E1159 Type 2 diabetes mellitus with other circulatory complications: Secondary | ICD-10-CM | POA: Diagnosis not present

## 2017-04-10 DIAGNOSIS — R109 Unspecified abdominal pain: Secondary | ICD-10-CM

## 2017-04-10 DIAGNOSIS — I10 Essential (primary) hypertension: Secondary | ICD-10-CM | POA: Diagnosis not present

## 2017-04-10 DIAGNOSIS — L03317 Cellulitis of buttock: Secondary | ICD-10-CM

## 2017-04-10 DIAGNOSIS — E10649 Type 1 diabetes mellitus with hypoglycemia without coma: Secondary | ICD-10-CM | POA: Diagnosis not present

## 2017-04-10 DIAGNOSIS — R197 Diarrhea, unspecified: Secondary | ICD-10-CM | POA: Diagnosis not present

## 2017-04-10 DIAGNOSIS — R1110 Vomiting, unspecified: Secondary | ICD-10-CM | POA: Diagnosis not present

## 2017-04-10 LAB — URINALYSIS, ROUTINE W REFLEX MICROSCOPIC
Bacteria, UA: NONE SEEN
Bilirubin Urine: NEGATIVE
Glucose, UA: 50 mg/dL — AB
Ketones, ur: 20 mg/dL — AB
Leukocytes, UA: NEGATIVE
Nitrite: NEGATIVE
Protein, ur: 100 mg/dL — AB
Specific Gravity, Urine: 1.012 (ref 1.005–1.030)
Squamous Epithelial / LPF: NONE SEEN
pH: 8 (ref 5.0–8.0)

## 2017-04-10 LAB — HEMOGLOBIN A1C
Hgb A1c MFr Bld: 5.7 % — ABNORMAL HIGH (ref 4.8–5.6)
Mean Plasma Glucose: 116.89 mg/dL

## 2017-04-10 LAB — CBG MONITORING, ED: Glucose-Capillary: 225 mg/dL — ABNORMAL HIGH (ref 65–99)

## 2017-04-10 LAB — GLUCOSE, CAPILLARY
Glucose-Capillary: 360 mg/dL — ABNORMAL HIGH (ref 65–99)
Glucose-Capillary: 427 mg/dL — ABNORMAL HIGH (ref 65–99)

## 2017-04-10 IMAGING — CT CT ABD-PELV W/ CM
2 of 4 series · 14 of 46 positions shown, 16 images · IV contrast (agent unspecified)
Comparison: Pelvic CT dated [DATE]

CLINICAL DATA: 26-year-old female with abdominal pain. Nausea
vomiting and diarrhea.

EXAM:
CT ABDOMEN AND PELVIS WITH CONTRAST
TECHNIQUE: Multidetector CT imaging of the abdomen and pelvis was performed
using the standard protocol following bolus administration of
intravenous contrast.
CONTRAST:  100 cc [7G]

[Series 2: axial st · axial · 0.66mm/px · z∈[+1072,+1528]mm · 11 of 103 slices shown, 13 images]
[im 6/103  soft-tissue]
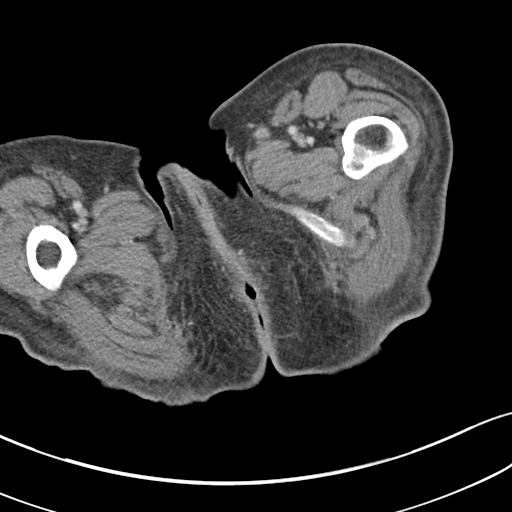
[im 6/103  bone]
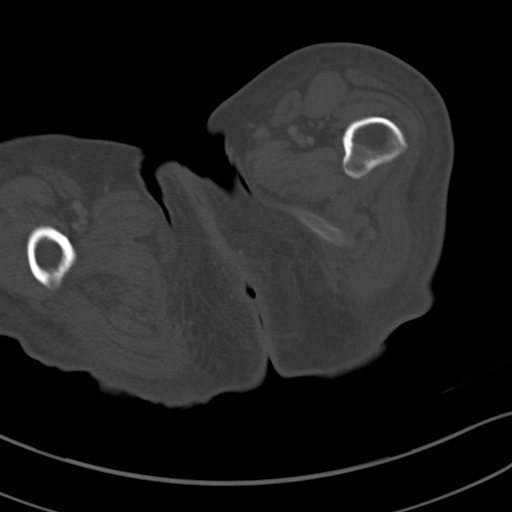
[im 17/103  soft-tissue]
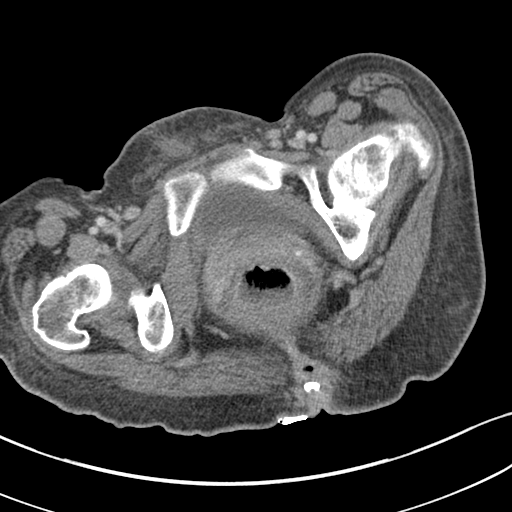
[im 27/103  soft-tissue]
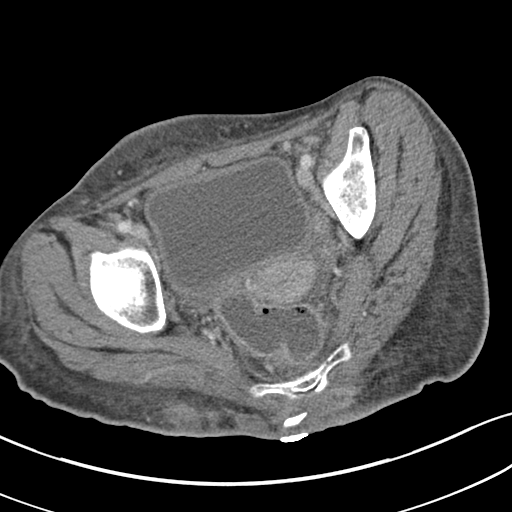
[im 33/103  soft-tissue]
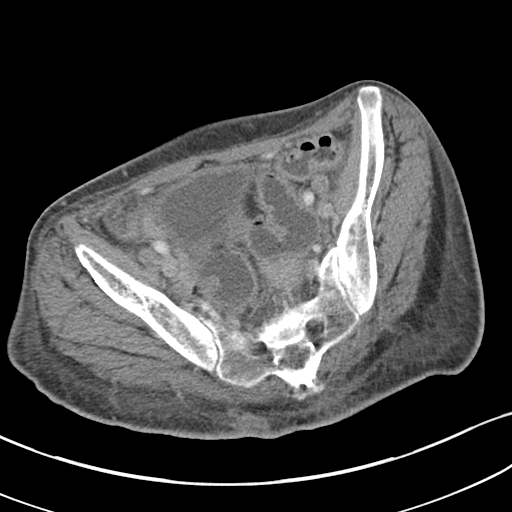
[im 43/103  soft-tissue]
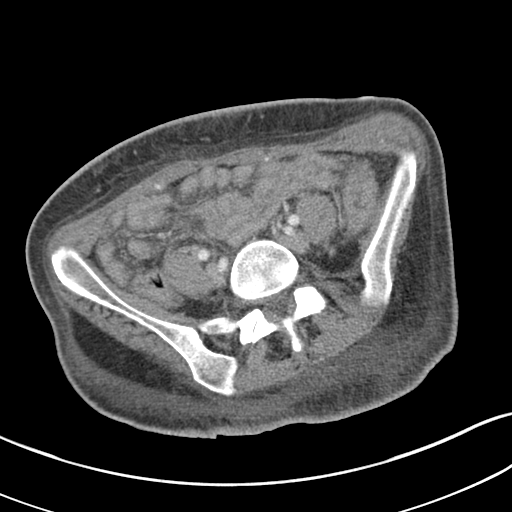
[im 54/103  soft-tissue]
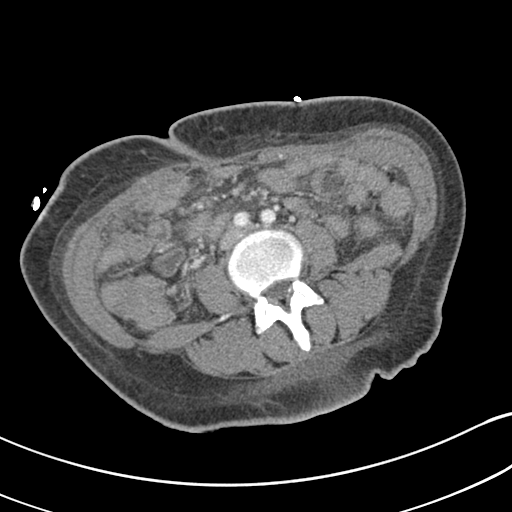
[im 60/103  soft-tissue]
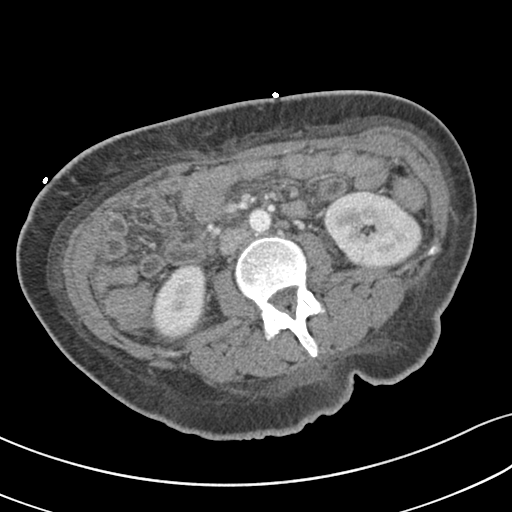
[im 70/103  soft-tissue]
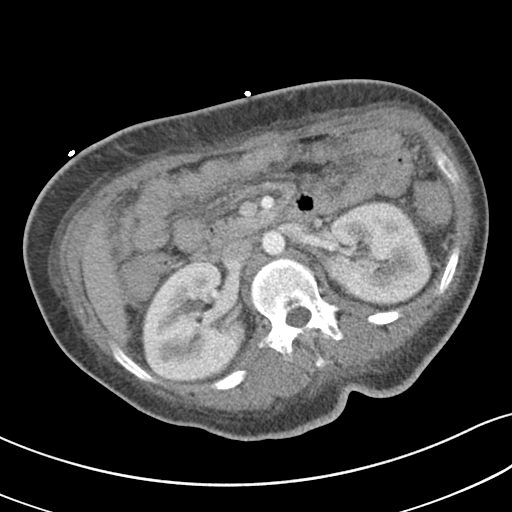
[im 76/103  soft-tissue]
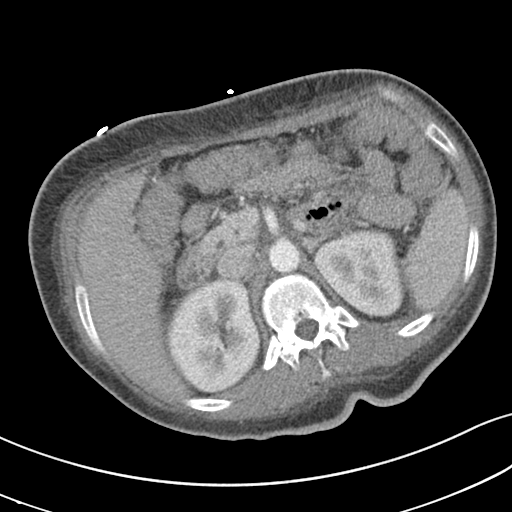
[im 76/103  bone]
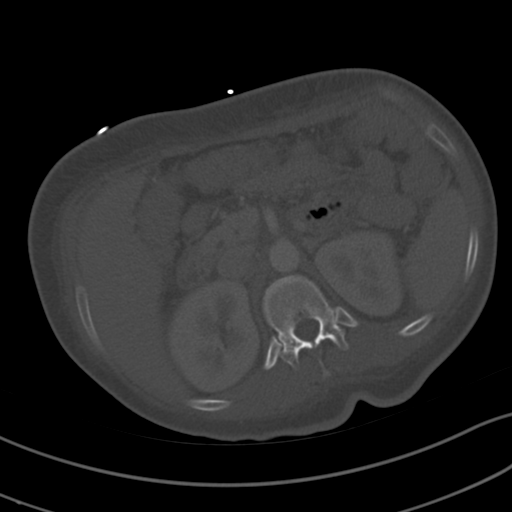
[im 86/103  soft-tissue]
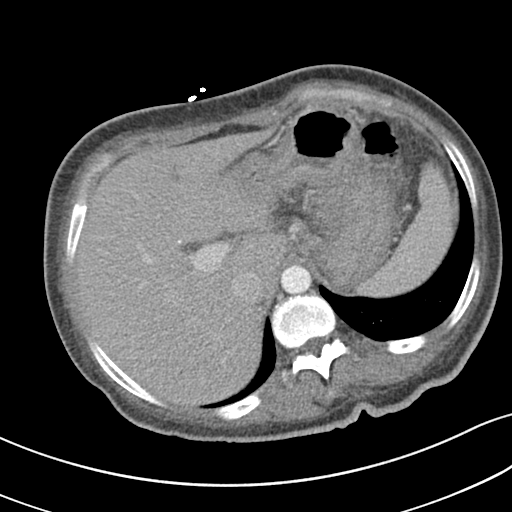
[im 97/103  soft-tissue]
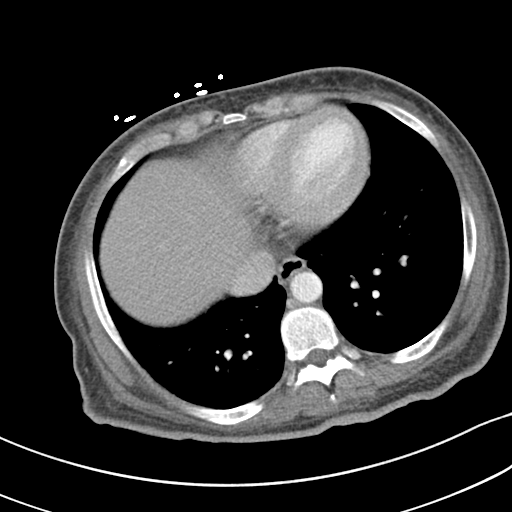

[Series 4: coronal st · coronal · 0.63mm/px · 3 of 77 slices shown]
[im 26/77  soft-tissue]
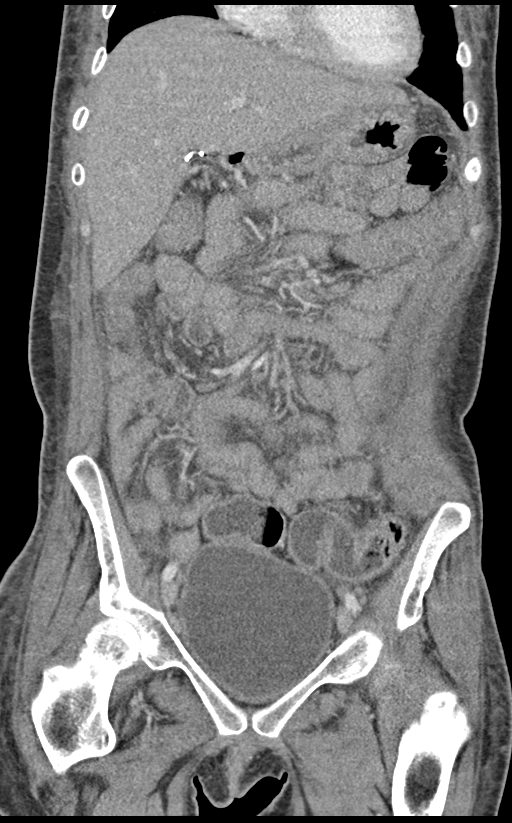
[im 34/77  soft-tissue]
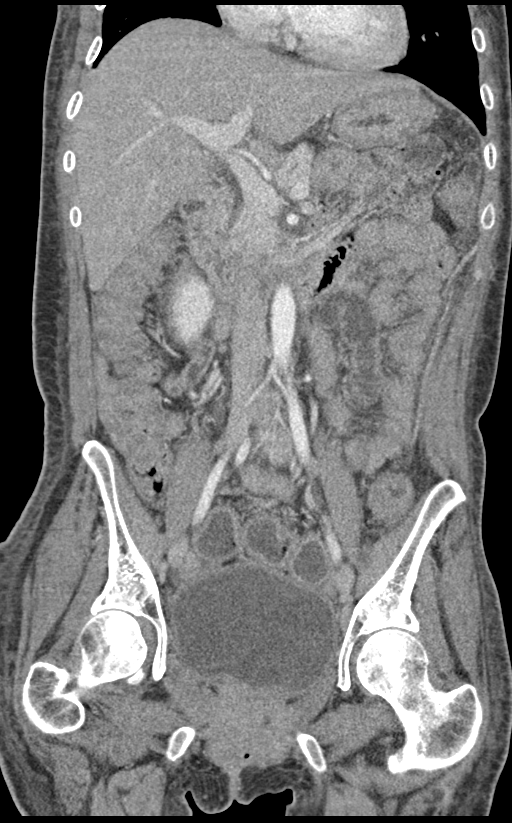
[im 43/77  soft-tissue]
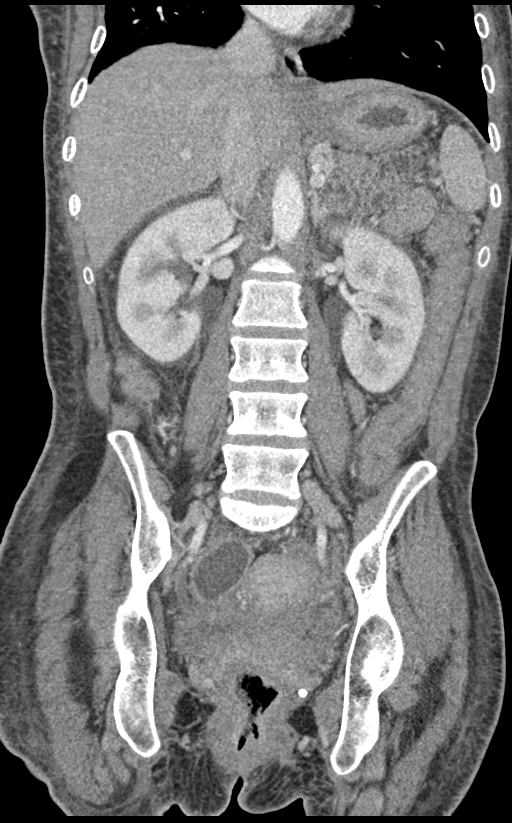

[14 of 46 positions shown; findings below may reference images not displayed]

FINDINGS: Lower chest: The visualized lung bases are clear. Top-normal cardiac
size. The heart is only partially visualized.

No intra-free air. There is diffuse mesenteric edema and stranding
and trace free fluid within the pelvis.

Hepatobiliary: Cholecystectomy. The liver is unremarkable. No
intrahepatic biliary ductal dilatation.

Pancreas: Unremarkable. No pancreatic ductal dilatation or
surrounding inflammatory changes.

Spleen: Normal in size without focal abnormality.

Adrenals/Urinary Tract: The adrenal glands are unremarkable. There
is no hydronephrosis on either side. There is symmetric enhancement
of the kidneys. The urinary bladder is distended and grossly
unremarkable.

Stomach/Bowel: Evaluation of the bowel is limited in the absence of
oral contrast. There is diffuse thickened appearance of the colon
which may be reactive and secondary to anasarca or represent
colitis. Loose stool noted in the colon compatible with diarrheal
state. Correlation with clinical exam and stool cultures
recommended. There is no bowel obstruction. Small tubular appearing
structure medial to the cecum in the right lower quadrant may
represent a normal appendix

Vascular/Lymphatic: Abdominal aorta and IVC appear unremarkable. The
origins of the celiac axis, SMA, and IMA are patent. No portal
venous gas. There is no adenopathy. Top-normal bilateral inguinal
lymph nodes noted, likely reactive.

Reproductive: The uterus is grossly unremarkable. The ovaries are
suboptimally visualized

Other: There is diffuse subcutaneous edema and anasarca. Patient is
status post recent I&D of abscess over the sacrum. A drainage tube
is seen in the intergluteal cleft and soft tissues superficial to
the sacrum. There is small amount of loculated fluid in the
subcutaneous soft tissues superficial to the right gluteal region
measuring approximately 6 cm in length and 1.5 cm in thickness. This
fluid may be partially organized.

Musculoskeletal: No acute or significant osseous findings.
IMPRESSION: 1. Status post recent incision and drainage of abscess superficial
to the sacral region and drainage catheter placement. Small amount
of loculated fluid is seen in the subcutaneous soft tissues
superficial to the right gluteus muscle.
2. Diffuse mesenteric and subcutaneous soft tissue edema and
anasarca.
3. Diarrheal state with diffusely thickened colon. Correlation with
clinical exam and stool cultures recommended to evaluate for
colitis. No bowel obstruction.

## 2017-04-10 MED ORDER — PANCRELIPASE (LIP-PROT-AMYL) 36000-114000 UNITS PO CPEP
36000.0000 [IU] | ORAL_CAPSULE | Freq: Three times a day (TID) | ORAL | Status: DC
  Administered 2017-04-10 – 2017-04-16 (×14): 36000 [IU] via ORAL
  Filled 2017-04-10: qty 3
  Filled 2017-04-10: qty 1
  Filled 2017-04-10: qty 3
  Filled 2017-04-10 (×2): qty 1
  Filled 2017-04-10: qty 3
  Filled 2017-04-10 (×2): qty 1
  Filled 2017-04-10 (×2): qty 3
  Filled 2017-04-10: qty 1
  Filled 2017-04-10 (×6): qty 3
  Filled 2017-04-10: qty 1
  Filled 2017-04-10 (×2): qty 3

## 2017-04-10 MED ORDER — CITALOPRAM HYDROBROMIDE 20 MG PO TABS: 20.0000 mg | ORAL_TABLET | Freq: Every day | ORAL | Status: DC

## 2017-04-10 MED ORDER — METOCLOPRAMIDE HCL 5 MG/ML IJ SOLN
10.0000 mg | Freq: Once | INTRAMUSCULAR | Status: AC
  Administered 2017-04-10: 10 mg via INTRAVENOUS
  Filled 2017-04-10: qty 2

## 2017-04-10 MED ORDER — GLUCOSE 4 G PO CHEW: 1.0000 | CHEWABLE_TABLET | ORAL | Status: DC | PRN

## 2017-04-10 MED ORDER — INSULIN GLARGINE 100 UNIT/ML ~~LOC~~ SOLN
20.0000 [IU] | Freq: Every day | SUBCUTANEOUS | Status: DC
  Administered 2017-04-10 – 2017-04-12 (×3): 20 [IU] via SUBCUTANEOUS
  Filled 2017-04-10 (×7): qty 0.2

## 2017-04-10 MED ORDER — FERROUS SULFATE 325 (65 FE) MG PO TABS
325.0000 mg | ORAL_TABLET | Freq: Two times a day (BID) | ORAL | Status: DC
  Administered 2017-04-10 – 2017-04-16 (×10): 325 mg via ORAL
  Filled 2017-04-10 (×10): qty 1

## 2017-04-10 MED ORDER — HYDROMORPHONE HCL 1 MG/ML IJ SOLN
1.0000 mg | INTRAMUSCULAR | Status: AC | PRN
  Administered 2017-04-10 (×3): 1 mg via INTRAVENOUS
  Filled 2017-04-10 (×3): qty 1

## 2017-04-10 MED ORDER — SODIUM CHLORIDE 0.9 % IV SOLN
INTRAVENOUS | Status: DC
  Administered 2017-04-11 (×2): via INTRAVENOUS

## 2017-04-10 MED ORDER — METOCLOPRAMIDE HCL 5 MG PO TABS
5.0000 mg | ORAL_TABLET | Freq: Three times a day (TID) | ORAL | Status: DC
  Administered 2017-04-10 – 2017-04-11 (×2): 5 mg via ORAL
  Filled 2017-04-10 (×2): qty 1

## 2017-04-10 MED ORDER — HYDROMORPHONE HCL 1 MG/ML IJ SOLN
1.0000 mg | Freq: Once | INTRAMUSCULAR | Status: AC
Start: 2017-04-10 — End: 2017-04-10
  Administered 2017-04-10: 1 mg via INTRAVENOUS
  Filled 2017-04-10: qty 1

## 2017-04-10 MED ORDER — METHOCARBAMOL 500 MG PO TABS
500.0000 mg | ORAL_TABLET | Freq: Three times a day (TID) | ORAL | Status: DC | PRN
  Filled 2017-04-10 (×2): qty 1

## 2017-04-10 MED ORDER — CHOLESTYRAMINE LIGHT 4 G PO PACK
4.0000 g | PACK | ORAL | Status: DC
  Administered 2017-04-10 – 2017-04-16 (×14): 4 g via ORAL
  Filled 2017-04-10 (×22): qty 1

## 2017-04-10 MED ORDER — INSULIN ASPART 100 UNIT/ML ~~LOC~~ SOLN
0.0000 [IU] | Freq: Three times a day (TID) | SUBCUTANEOUS | Status: DC
  Administered 2017-04-10: 9 [IU] via SUBCUTANEOUS
  Administered 2017-04-11 (×2): 1 [IU] via SUBCUTANEOUS
  Administered 2017-04-12: 2 [IU] via SUBCUTANEOUS
  Administered 2017-04-12: 5 [IU] via SUBCUTANEOUS
  Administered 2017-04-13: 1 [IU] via SUBCUTANEOUS
  Administered 2017-04-14 (×2): 2 [IU] via SUBCUTANEOUS
  Administered 2017-04-15: 1 [IU] via SUBCUTANEOUS
  Administered 2017-04-15: 2 [IU] via SUBCUTANEOUS
  Administered 2017-04-16: 3 [IU] via SUBCUTANEOUS

## 2017-04-10 MED ORDER — SODIUM CHLORIDE 0.9 % IV SOLN
INTRAVENOUS | Status: DC
  Administered 2017-04-10 – 2017-04-11 (×2): via INTRAVENOUS

## 2017-04-10 MED ORDER — IOPAMIDOL (ISOVUE-300) INJECTION 61%
100.0000 mL | Freq: Once | INTRAVENOUS | Status: AC | PRN
  Administered 2017-04-10: 100 mL via INTRAVENOUS

## 2017-04-10 MED ORDER — METHYLPREDNISOLONE SODIUM SUCC 125 MG IJ SOLR
125.0000 mg | Freq: Once | INTRAMUSCULAR | Status: AC
  Administered 2017-04-10: 125 mg via INTRAVENOUS
  Filled 2017-04-10: qty 2

## 2017-04-10 MED ORDER — ALBUTEROL SULFATE (2.5 MG/3ML) 0.083% IN NEBU: 3.0000 mL | INHALATION_SOLUTION | RESPIRATORY_TRACT | Status: DC | PRN

## 2017-04-10 MED ORDER — INSULIN ASPART 100 UNIT/ML ~~LOC~~ SOLN
12.0000 [IU] | Freq: Once | SUBCUTANEOUS | Status: AC
  Administered 2017-04-10: 12 [IU] via SUBCUTANEOUS

## 2017-04-10 MED ORDER — FOLIC ACID 1 MG PO TABS
1.0000 mg | ORAL_TABLET | Freq: Every day | ORAL | Status: DC
  Administered 2017-04-10 – 2017-04-14 (×4): 1 mg via ORAL
  Filled 2017-04-10 (×5): qty 1

## 2017-04-10 MED ORDER — LOPERAMIDE HCL 2 MG PO CAPS
2.0000 mg | ORAL_CAPSULE | Freq: Three times a day (TID) | ORAL | Status: DC
  Administered 2017-04-10 – 2017-04-16 (×12): 2 mg via ORAL
  Filled 2017-04-10 (×16): qty 1

## 2017-04-10 MED ORDER — IBUPROFEN 400 MG PO TABS: 400.0000 mg | ORAL_TABLET | Freq: Two times a day (BID) | ORAL | Status: DC | PRN

## 2017-04-10 MED ORDER — HYDROCODONE-ACETAMINOPHEN 5-325 MG PO TABS
1.0000 | ORAL_TABLET | ORAL | Status: DC | PRN
  Administered 2017-04-10 – 2017-04-11 (×2): 1 via ORAL
  Filled 2017-04-10 (×2): qty 1

## 2017-04-10 MED ORDER — GI COCKTAIL ~~LOC~~
30.0000 mL | Freq: Once | ORAL | Status: DC
  Filled 2017-04-10: qty 30

## 2017-04-10 MED ORDER — DIPHENOXYLATE-ATROPINE 2.5-0.025 MG PO TABS
2.0000 | ORAL_TABLET | ORAL | Status: DC
  Administered 2017-04-11 – 2017-04-16 (×18): 2 via ORAL
  Filled 2017-04-10 (×19): qty 2

## 2017-04-10 MED ORDER — GLUCERNA SHAKE PO LIQD
237.0000 mL | Freq: Two times a day (BID) | ORAL | Status: DC
  Administered 2017-04-10: 237 mL via ORAL
  Filled 2017-04-10 (×4): qty 237

## 2017-04-10 MED ORDER — PROMETHAZINE HCL 25 MG PO TABS: 12.5000 mg | ORAL_TABLET | Freq: Four times a day (QID) | ORAL | Status: DC | PRN

## 2017-04-10 MED ORDER — CITALOPRAM HYDROBROMIDE 20 MG PO TABS
20.0000 mg | ORAL_TABLET | Freq: Every day | ORAL | Status: DC
Start: 2017-04-10 — End: 2017-04-16
  Administered 2017-04-10 – 2017-04-16 (×6): 20 mg via ORAL
  Filled 2017-04-10 (×7): qty 1

## 2017-04-10 MED ORDER — ONDANSETRON HCL 4 MG/2ML IJ SOLN
4.0000 mg | Freq: Four times a day (QID) | INTRAMUSCULAR | Status: DC | PRN
  Administered 2017-04-10 – 2017-04-11 (×3): 4 mg via INTRAVENOUS
  Filled 2017-04-10 (×3): qty 2

## 2017-04-10 MED ORDER — EPINEPHRINE 0.3 MG/0.3ML IJ SOAJ
0.3000 mg | INTRAMUSCULAR | Status: DC | PRN
  Filled 2017-04-10: qty 0.3

## 2017-04-10 MED ORDER — KETOROLAC TROMETHAMINE 15 MG/ML IJ SOLN
15.0000 mg | Freq: Four times a day (QID) | INTRAMUSCULAR | Status: AC | PRN
  Administered 2017-04-10 – 2017-04-15 (×8): 15 mg via INTRAVENOUS
  Filled 2017-04-10 (×8): qty 1

## 2017-04-10 MED ORDER — GABAPENTIN 300 MG PO CAPS
300.0000 mg | ORAL_CAPSULE | Freq: Two times a day (BID) | ORAL | Status: DC
  Administered 2017-04-10 – 2017-04-16 (×12): 300 mg via ORAL
  Filled 2017-04-10 (×14): qty 1

## 2017-04-10 MED ORDER — PIPERACILLIN-TAZOBACTAM 3.375 G IVPB 30 MIN
3.3750 g | Freq: Once | INTRAVENOUS | Status: AC
  Administered 2017-04-10: 3.375 g via INTRAVENOUS
  Filled 2017-04-10: qty 50

## 2017-04-10 MED ORDER — FUROSEMIDE 40 MG PO TABS
40.0000 mg | ORAL_TABLET | Freq: Every day | ORAL | Status: DC
  Administered 2017-04-10: 40 mg via ORAL
  Filled 2017-04-10 (×2): qty 1

## 2017-04-10 NOTE — Progress Notes (Signed)
04/10/17 2030 Nursing Dr Bruna Potter called reg cbg tonight [phone]. Order received for 12 units novolog insulin

## 2017-04-10 NOTE — ED Notes (Signed)
Called Annabelle Harman to give report at specified time and she would not take report due to being on lunch. Said she would call back.

## 2017-04-10 NOTE — ED Notes (Signed)
ED TO INPATIENT HANDOFF REPORT  Name/Age/Gender [name] 27 y.o. female  Code Status Code Status History    Date Active Date Inactive Code Status Order ID Comments User Context   03/23/2017 13:16 03/24/2017 18:54 Full Code [id number]  Phillips Grout, MD Inpatient   03/17/2017 00:50 03/21/2017 23:52 Full Code [id number]  Rise Patience, MD ED   02/26/2017 22:59 03/14/2017 22:21 Full Code [id number]  Karmen Bongo, MD Inpatient   02/26/2017 20:12 02/26/2017 22:59 Full Code [id number]  Aldean Jewett, MD ED   01/26/2015 05:13 01/29/2015 18:12 Full Code [id number]  Morene Crocker, [place] Inpatient   01/24/2015 10:21 01/26/2015 05:13 Full Code [id number]  Mcneil Sober, RN Inpatient   01/16/2015 19:30 01/24/2015 10:21 Full Code [id number]  Gwen Pounds, CNM Inpatient   06/11/2013 00:49 06/12/2013 13:40 Full Code [id number]  Theressa Millard, MD ED   04/28/2013 03:19 04/29/2013 18:14 Full Code [id number]  Rise Patience, MD Inpatient   09/01/2012 07:49 09/02/2012 11:59 Full Code [id number]  Derrill Kay, MD Inpatient   03/15/2012 23:46 03/16/2012 16:46 Full Code [id number]  Rueben Bash, RN Inpatient   02/28/2012 16:57 03/01/2012 19:21 Full Code [id number]  Tamsen Roers, RN Inpatient   11/23/2011 03:36 12/06/2011 20:04 Full Code [id number]  Aldean Ast, RN Inpatient   09/18/2011 10:07 09/20/2011 20:50 Full Code [id number]  Dionne Milo, NP Inpatient      Home/SNF/Other Home  Chief Complaint back and stomach pain, nausea  Level of Care/Admitting Diagnosis ED Disposition    ED Disposition Condition [facility] Area: [place] [[id number]]  Level of Care: Med-Surg [16]  Diagnosis: Nausea and vomiting [790240]  Admitting Physician: Phillips Grout [[id number]]  Attending Physician: Derrill Kay A [[id number]]  PT Class (Do Not Modify): Observation [104]  PT Acc Code (Do Not Modify): Observation [10022]       Medical History Past Medical  History:  Diagnosis Date  . Anxiety   . Crohn's disease ([place])   . Diabetes type 1, uncontrolled ([place]) 11/14/2011   Since age 40  . Fibromyalgia   . Gastroparesis   . Hypertension   . Infection    UTI April 2016    Allergies Allergies  Allergen Reactions  . Peanut-Containing Drug Products Anaphylaxis and Other (See Comments)    Pt states that she is allergic to [place] nuts; can tolerate peanuts  . Lactose Intolerance (Gi) Other (See Comments)    Gi upset     IV Location/Drains/Wounds Patient Lines/Drains/Airways Status   Active Line/Drains/Airways    Name:   Placement date:   Placement time:   Site:   Days:   Peripheral IV 04/10/17 Right Antecubital   04/10/17    0016    Antecubital   less than 1   Incision (Closed) 03/01/17 Perineum Other (Comment)   03/01/17    1058     40   Incision (Closed) 03/23/17 Buttocks   03/23/17    1108     18          Labs/Imaging Results for orders placed or performed during the hospital encounter of 04/09/17 (from the past 48 hour(s))  Lipase, blood     Status: None   Collection Time: 04/09/17  4:10 PM  Result Value Ref Range   Lipase 14 11 - 51 U/L  Comprehensive metabolic panel     Status: Abnormal   Collection Time: 04/09/17  4:10 PM  Result Value Ref  Range   Sodium 137 135 - 145 mmol/L   Potassium 4.0 3.5 - 5.1 mmol/L   Chloride 104 101 - 111 mmol/L   CO2 28 22 - 32 mmol/L   Glucose, Bld 126 (H) 65 - 99 mg/dL   BUN 9 6 - 20 mg/dL   Creatinine, Ser 0.36 (L) 0.44 - 1.00 mg/dL   Calcium 8.8 (L) 8.9 - 10.3 mg/dL   Total Protein 8.1 6.5 - 8.1 g/dL   Albumin 3.1 (L) 3.5 - 5.0 g/dL   AST 27 15 - 41 U/L   ALT 21 14 - 54 U/L   Alkaline Phosphatase 79 38 - 126 U/L   Total Bilirubin 0.5 0.3 - 1.2 mg/dL   GFR calc non Af Amer >60 >60 mL/min   GFR calc Af Amer >60 >60 mL/min    Comment: (NOTE) The eGFR has been calculated using the CKD EPI equation. This calculation has not been validated in all clinical situations. eGFR's persistently  <60 mL/min signify possible Chronic Kidney Disease.    Anion gap 5 5 - 15  CBC     Status: Abnormal   Collection Time: 04/09/17  4:10 PM  Result Value Ref Range   WBC 4.8 4.0 - 10.5 K/uL   RBC 3.65 (L) 3.87 - 5.11 MIL/uL   Hemoglobin 10.6 (L) 12.0 - 15.0 g/dL   HCT 33.0 (L) 36.0 - 46.0 %   MCV 90.4 78.0 - 100.0 fL   MCH 29.0 26.0 - 34.0 pg   MCHC 32.1 30.0 - 36.0 g/dL   RDW 12.7 11.5 - 15.5 %   Platelets 237 150 - 400 K/uL  I-Stat beta hCG blood, ED     Status: None   Collection Time: 04/09/17  4:36 PM  Result Value Ref Range   I-stat hCG, quantitative <5.0 <5 mIU/mL   Comment 3            Comment:   GEST. AGE      CONC.  (mIU/mL)   <=1 WEEK        5 - 50     2 WEEKS       50 - 500     3 WEEKS       100 - 10,000     4 WEEKS     1,000 - 30,000        FEMALE AND NON-PREGNANT FEMALE:     LESS THAN 5 mIU/mL   CBG monitoring, ED     Status: Abnormal   Collection Time: 04/09/17 11:50 PM  Result Value Ref Range   Glucose-Capillary 192 (H) 65 - 99 mg/dL   Comment 1 Notify RN   Urinalysis, Routine w reflex microscopic     Status: Abnormal   Collection Time: 04/10/17  1:26 AM  Result Value Ref Range   Color, Urine STRAW (A) YELLOW   APPearance CLEAR CLEAR   Specific Gravity, Urine 1.012 1.005 - 1.030   pH 8.0 5.0 - 8.0   Glucose, UA 50 (A) NEGATIVE mg/dL   Hgb urine dipstick MODERATE (A) NEGATIVE   Bilirubin Urine NEGATIVE NEGATIVE   Ketones, ur 20 (A) NEGATIVE mg/dL   Protein, ur 100 (A) NEGATIVE mg/dL   Nitrite NEGATIVE NEGATIVE   Leukocytes, UA NEGATIVE NEGATIVE   RBC / HPF TOO NUMEROUS TO COUNT 0 - 5 RBC/hpf   WBC, UA 0-5 0 - 5 WBC/hpf   Bacteria, UA NONE SEEN NONE SEEN   Squamous Epithelial / LPF NONE SEEN NONE SEEN  Mucus PRESENT   CBG monitoring, ED     Status: Abnormal   Collection Time: 04/10/17  5:45 AM  Result Value Ref Range   Glucose-Capillary 225 (H) 65 - 99 mg/dL   Ct Abdomen Pelvis W Contrast  Result Date: 04/10/2017 CLINICAL DATA:  27 year old female  with abdominal pain. Nausea vomiting and diarrhea. EXAM: CT ABDOMEN AND PELVIS WITH CONTRAST TECHNIQUE: Multidetector CT imaging of the abdomen and pelvis was performed using the standard protocol following bolus administration of intravenous contrast. CONTRAST:  100 cc Isovue-300 COMPARISON:  Pelvic CT dated 03/23/2017 FINDINGS: Lower chest: The visualized lung bases are clear. Top-normal cardiac size. The heart is only partially visualized. No intra-free air. There is diffuse mesenteric edema and stranding and trace free fluid within the pelvis. Hepatobiliary: Cholecystectomy. The liver is unremarkable. No intrahepatic biliary ductal dilatation. Pancreas: Unremarkable. No pancreatic ductal dilatation or surrounding inflammatory changes. Spleen: Normal in size without focal abnormality. Adrenals/Urinary Tract: The adrenal glands are unremarkable. There is no hydronephrosis on either side. There is symmetric enhancement of the kidneys. The urinary bladder is distended and grossly unremarkable. Stomach/Bowel: Evaluation of the bowel is limited in the absence of oral contrast. There is diffuse thickened appearance of the colon which may be reactive and secondary to anasarca or represent colitis. Loose stool noted in the colon compatible with diarrheal state. Correlation with clinical exam and stool cultures recommended. There is no bowel obstruction. Small tubular appearing structure medial to the cecum in the right lower quadrant may represent a normal appendix Vascular/Lymphatic: Abdominal aorta and IVC appear unremarkable. The origins of the celiac axis, SMA, and IMA are patent. No portal venous gas. There is no adenopathy. Top-normal bilateral inguinal lymph nodes noted, likely reactive. Reproductive: The uterus is grossly unremarkable. The ovaries are suboptimally visualized Other: There is diffuse subcutaneous edema and anasarca. Patient is status post recent I&D of abscess over the sacrum. A drainage tube is  seen in the intergluteal cleft and soft tissues superficial to the sacrum. There is small amount of loculated fluid in the subcutaneous soft tissues superficial to the right gluteal region measuring approximately 6 cm in length and 1.5 cm in thickness. This fluid may be partially organized. Musculoskeletal: No acute or significant osseous findings. IMPRESSION: 1. Status post recent incision and drainage of abscess superficial to the sacral region and drainage catheter placement. Small amount of loculated fluid is seen in the subcutaneous soft tissues superficial to the right gluteus muscle. 2. Diffuse mesenteric and subcutaneous soft tissue edema and anasarca. 3. Diarrheal state with diffusely thickened colon. Correlation with clinical exam and stool cultures recommended to evaluate for colitis. No bowel obstruction. Electronically Signed   By: Anner Crete M.D.   On: 04/10/2017 02:36    Pending Labs [employer] (From admission, onward)   Start     Ordered   Signed and Occupational hygienist morning,   R     Signed and Held   Signed and Held  CBC  Tomorrow morning,   R     Signed and Held      Vitals/Pain Today's Vitals   04/10/17 1048 04/10/17 1143 04/10/17 1255 04/10/17 1301  BP: (!) 157/99  (!) 152/89   Pulse: (!) 103  99   Resp: 17  14   Temp:      TempSrc:      SpO2: 99%  100%   Weight:      Height:      PainSc:  2   5     Isolation Precautions No active isolations  Medications Medications  0.9 %  sodium chloride infusion ( Intravenous New Bag/Given 04/10/17 1016)  ondansetron (ZOFRAN) injection 4 mg (4 mg Intravenous Given 04/10/17 1203)  ondansetron (ZOFRAN-ODT) disintegrating tablet 4 mg (4 mg Oral Given 04/09/17 1453)  ondansetron (ZOFRAN-ODT) disintegrating tablet 4 mg (4 mg Oral Given 04/09/17 2222)  sodium chloride 0.9 % bolus 1,000 mL (0 mLs Intravenous Stopped 04/10/17 0231)  HYDROmorphone (DILAUDID) injection 1 mg (1 mg Intravenous Given 04/10/17  0013)  promethazine (PHENERGAN) injection 25 mg (25 mg Intravenous Given 04/10/17 0013)  iopamidol (ISOVUE-300) 61 % injection 100 mL (100 mLs Intravenous Contrast Given 04/10/17 0141)  HYDROmorphone (DILAUDID) injection 1 mg (1 mg Intravenous Given 04/10/17 0246)  piperacillin-tazobactam (ZOSYN) IVPB 3.375 g (0 g Intravenous Stopped 04/10/17 0447)  methylPREDNISolone sodium succinate (SOLU-MEDROL) 125 mg/2 mL injection 125 mg (125 mg Intravenous Given 04/10/17 0344)  metoCLOPramide (REGLAN) injection 10 mg (10 mg Intravenous Given 04/10/17 0344)  HYDROmorphone (DILAUDID) injection 1 mg (1 mg Intravenous Given 04/10/17 1203)    Mobility walks

## 2017-04-10 NOTE — Consult Note (Signed)
Reason for Consult: Nausea/Vomiting and ABM pain Referring Physician: [facility]  [name] HPI: This is a 27 year old female WITHOUT A KNOWN HISTORY OF CROHN'S DISEASE, poorly controlled DM, perirectal abscess, gluteal abscess, and gastroparesis admitted for complaints of nausea, vomiting, and abdominal pain.  She was admitted for a prolonged admission during the first half of December.  At that time she presented in a severely malnourished state.  Her albumin was very low and she had anasarca as well as ascites.  She was emperically treated for C. Diff, but this did not improve her situation.  Creon was attempted as she has a history of type 1 DM, but there was no improved.  Scheduled Imodium and then Lomtil were tried, which helped.  The most benefit was achieved when cholestyramine was initiated.  She was able to be discharged home and she was supposed to follow up with Dr. Collene Mares as an outpatient, however, she was in the hospital two more times.  The latest on 03/23/2017 was for a gluteal abscess.  She presented this time as she had intractable nausea and vomiting associated with some lower abdominal pain.  She vomited to the point that she was dry heaving.  Nyree was concerned about her situation and presented to the hospital as she did not want to become dehydrated.  Past Medical History:  Diagnosis Date  . Anxiety   . Crohn's disease ([place])   . Diabetes type 1, uncontrolled ([place]) 11/14/2011   Since age 15  . Fibromyalgia   . Gastroparesis   . Hypertension   . Infection    UTI April 2016    Past Surgical History:  Procedure Laterality Date  . ANKLE SURGERY    . CHOLECYSTECTOMY  11/15/2011   Procedure: LAPAROSCOPIC CHOLECYSTECTOMY WITH INTRAOPERATIVE CHOLANGIOGRAM;  Surgeon: [name], MD;  Location: [facility];  Service: General;  Laterality: N/A;  . ESOPHAGOGASTRODUODENOSCOPY  12/03/2011   Procedure: ESOPHAGOGASTRODUODENOSCOPY (EGD);  Surgeon: Beryle Beams, MD;  Location:  [facility];  Service: Endoscopy;  Laterality: N/A;  . FLEXIBLE SIGMOIDOSCOPY N/A 03/10/2017   Procedure: FLEXIBLE SIGMOIDOSCOPY;  Surgeon: Carol Ada, MD;  Location: [facility];  Service: Endoscopy;  Laterality: N/A;  . INCISION AND DRAINAGE PERIRECTAL ABSCESS N/A 03/01/2017   Procedure: IRRIGATION AND DEBRIDEMENT PERIRECTAL ABSCESS;  Surgeon: Alphonsa Overall, MD;  Location: [facility];  Service: General;  Laterality: N/A;  . IRRIGATION AND DEBRIDEMENT BUTTOCKS N/A 03/23/2017   Procedure: IRRIGATION AND DEBRIDEMENT BUTTOCKS, SETON PLACEMENT;  Surgeon: [name], MD;  Location: [facility];  Service: General;  Laterality: N/A;  . LAPAROSCOPY  11/23/2011   Procedure: LAPAROSCOPY DIAGNOSTIC;  Surgeon: Edward Jolly, MD;  Location: [facility];  Service: General;  Laterality: N/A;    Family History  Problem Relation Age of Onset  . Diabetes Mother   . Hypertension Father     Social History:  reports that  has never smoked. she has never used smokeless tobacco. She reports that she drinks alcohol. She reports that she does not use drugs.  Allergies:  Allergies  Allergen Reactions  . Peanut-Containing Drug Products Anaphylaxis and Other (See Comments)    Pt states that she is allergic to [place] nuts; can tolerate peanuts  . Lactose Intolerance (Gi) Other (See Comments)    Gi upset     Medications:  Scheduled:  Continuous: . sodium chloride 125 mL/hr at 04/10/17 1016    Results for orders placed or performed during the hospital encounter of 04/09/17 (from the  past 24 hour(s))  Lipase, blood     Status: None   Collection Time: 04/09/17  4:10 PM  Result Value Ref Range   Lipase 14 11 - 51 U/L  Comprehensive metabolic panel     Status: Abnormal   Collection Time: 04/09/17  4:10 PM  Result Value Ref Range   Sodium 137 135 - 145 mmol/L   Potassium 4.0 3.5 - 5.1 mmol/L   Chloride 104 101 - 111 mmol/L   CO2 28 22 - 32 mmol/L   Glucose, Bld 126 (H) 65 - 99 mg/dL   BUN 9 6 - 20 mg/dL    Creatinine, Ser 0.36 (L) 0.44 - 1.00 mg/dL   Calcium 8.8 (L) 8.9 - 10.3 mg/dL   Total Protein 8.1 6.5 - 8.1 g/dL   Albumin 3.1 (L) 3.5 - 5.0 g/dL   AST 27 15 - 41 U/L   ALT 21 14 - 54 U/L   Alkaline Phosphatase 79 38 - 126 U/L   Total Bilirubin 0.5 0.3 - 1.2 mg/dL   GFR calc non Af Amer >60 >60 mL/min   GFR calc Af Amer >60 >60 mL/min   Anion gap 5 5 - 15  CBC     Status: Abnormal   Collection Time: 04/09/17  4:10 PM  Result Value Ref Range   WBC 4.8 4.0 - 10.5 K/uL   RBC 3.65 (L) 3.87 - 5.11 MIL/uL   Hemoglobin 10.6 (L) 12.0 - 15.0 g/dL   HCT 33.0 (L) 36.0 - 46.0 %   MCV 90.4 78.0 - 100.0 fL   MCH 29.0 26.0 - 34.0 pg   MCHC 32.1 30.0 - 36.0 g/dL   RDW 12.7 11.5 - 15.5 %   Platelets 237 150 - 400 K/uL  I-Stat beta hCG blood, ED     Status: None   Collection Time: 04/09/17  4:36 PM  Result Value Ref Range   I-stat hCG, quantitative <5.0 <5 mIU/mL   Comment 3          CBG monitoring, ED     Status: Abnormal   Collection Time: 04/09/17 11:50 PM  Result Value Ref Range   Glucose-Capillary 192 (H) 65 - 99 mg/dL   Comment 1 Notify RN   Urinalysis, Routine w reflex microscopic     Status: Abnormal   Collection Time: 04/10/17  1:26 AM  Result Value Ref Range   Color, Urine STRAW (A) YELLOW   APPearance CLEAR CLEAR   Specific Gravity, Urine 1.012 1.005 - 1.030   pH 8.0 5.0 - 8.0   Glucose, UA 50 (A) NEGATIVE mg/dL   Hgb urine dipstick MODERATE (A) NEGATIVE   Bilirubin Urine NEGATIVE NEGATIVE   Ketones, ur 20 (A) NEGATIVE mg/dL   Protein, ur 100 (A) NEGATIVE mg/dL   Nitrite NEGATIVE NEGATIVE   Leukocytes, UA NEGATIVE NEGATIVE   RBC / HPF TOO NUMEROUS TO COUNT 0 - 5 RBC/hpf   WBC, UA 0-5 0 - 5 WBC/hpf   Bacteria, UA NONE SEEN NONE SEEN   Squamous Epithelial / LPF NONE SEEN NONE SEEN   Mucus PRESENT   CBG monitoring, ED     Status: Abnormal   Collection Time: 04/10/17  5:45 AM  Result Value Ref Range   Glucose-Capillary 225 (H) 65 - 99 mg/dL     Ct Abdomen Pelvis W  Contrast  Result Date: 04/10/2017 CLINICAL DATA:  27 year old female with abdominal pain. Nausea vomiting and diarrhea. EXAM: CT ABDOMEN AND PELVIS WITH CONTRAST TECHNIQUE: Multidetector CT imaging  of the abdomen and pelvis was performed using the standard protocol following bolus administration of intravenous contrast. CONTRAST:  100 cc Isovue-300 COMPARISON:  Pelvic CT dated 03/23/2017 FINDINGS: Lower chest: The visualized lung bases are clear. Top-normal cardiac size. The heart is only partially visualized. No intra-free air. There is diffuse mesenteric edema and stranding and trace free fluid within the pelvis. Hepatobiliary: Cholecystectomy. The liver is unremarkable. No intrahepatic biliary ductal dilatation. Pancreas: Unremarkable. No pancreatic ductal dilatation or surrounding inflammatory changes. Spleen: Normal in size without focal abnormality. Adrenals/Urinary Tract: The adrenal glands are unremarkable. There is no hydronephrosis on either side. There is symmetric enhancement of the kidneys. The urinary bladder is distended and grossly unremarkable. Stomach/Bowel: Evaluation of the bowel is limited in the absence of oral contrast. There is diffuse thickened appearance of the colon which may be reactive and secondary to anasarca or represent colitis. Loose stool noted in the colon compatible with diarrheal state. Correlation with clinical exam and stool cultures recommended. There is no bowel obstruction. Small tubular appearing structure medial to the cecum in the right lower quadrant may represent a normal appendix Vascular/Lymphatic: Abdominal aorta and IVC appear unremarkable. The origins of the celiac axis, SMA, and IMA are patent. No portal venous gas. There is no adenopathy. Top-normal bilateral inguinal lymph nodes noted, likely reactive. Reproductive: The uterus is grossly unremarkable. The ovaries are suboptimally visualized Other: There is diffuse subcutaneous edema and anasarca. Patient is  status post recent I&D of abscess over the sacrum. A drainage tube is seen in the intergluteal cleft and soft tissues superficial to the sacrum. There is small amount of loculated fluid in the subcutaneous soft tissues superficial to the right gluteal region measuring approximately 6 cm in length and 1.5 cm in thickness. This fluid may be partially organized. Musculoskeletal: No acute or significant osseous findings. IMPRESSION: 1. Status post recent incision and drainage of abscess superficial to the sacral region and drainage catheter placement. Small amount of loculated fluid is seen in the subcutaneous soft tissues superficial to the right gluteus muscle. 2. Diffuse mesenteric and subcutaneous soft tissue edema and anasarca. 3. Diarrheal state with diffusely thickened colon. Correlation with clinical exam and stool cultures recommended to evaluate for colitis. No bowel obstruction. Electronically Signed   By: Anner Crete M.D.   On: 04/10/2017 02:36    ROS:  As stated above in the HPI otherwise negative.  Blood pressure (!) 152/89, pulse 99, temperature 98.2 F (36.8 C), temperature source Oral, resp. rate 14, height 5' 7"  (1.702 m), weight 68 kg (150 lb), SpO2 100 %, currently breastfeeding.    PE: Gen: NAD, Alert and Oriented HEENT:  [place]/AT, EOMI Neck: Supple, no LAD Lungs: CTA Bilaterally CV: RRR without M/G/R ABM: Soft, NTND, +BS Ext: No C/C/E  Assessment/Plan: 1) Gastroparesis. 2) Nausea and vomiting. 3) Diarrhea - Controlled.   The patient's albumin is much better and clinically she is much better overall.  She took to heart about being more diligent with controlling her blood sugar and she states that a calculated A1C with her endocrinologist was in the 6% range.  It is very encouraging to see that she has improved her dietary habits, but certain problems, such as gastroparesis, cannot be reversed.  Hopefully with conservative management, I.e., supportive care her symptoms can  abate.  As for her diarrhea, keeping on a rigid cholestyramine regimen has helped to control her stooling to 3 times per day and the stool is soft.     I  do not know where this history of Crohn's disease was given to the patient.  There is no evidence of inflammation with biopsies, imaging, or inflammatory markers to suggest this etiology.  All treatments for this issue should not be used.  Plan: 1) Supportive care. 2) Treat symptoms as needed. Machelle Raybon D 04/10/2017, 1:29 PM

## 2017-04-10 NOTE — ED Notes (Addendum)
Pt resting in bed with eyes closed. Respirations even and unlabored. Previous to this, pt was able to ambulate independently with no difficulty to restroom.

## 2017-04-10 NOTE — H&P (Signed)
History and Physical    [name] CXK:[id number] DOB: 11/12/1990 DOA: 04/09/2017  PCP: Girtha Rm, NP-C  Patient coming from:  home  Chief Complaint:  N/v/d abdominal pain  HPI: [name] is a 27 y.o. female with medical history significant of IDDM poorly controlled, gastroparesis with abnormal emptying study in 2013, anxiety, fibromyalgia, htn, recent perirectal absess which has resolved then soon after gluteal absess which has been drained by general surgery on 12/24 with a course of abx still with drain, and very unclear diagnosis of crohns.  It is documented in her chart that she has crohns but no GI notes actually say this.  She had a flex sig last month that had biopsy showing no imflammatory changes.  She sees dr, hung.  It appears as the diagnosis was formalized by general surgery for the perirectal/rectal absess.  She has chronic diarrhea for which cdfiff has been ruled out.  No fevers.  She Is overall noncompliant with her diabetes treatment.  No bleeding issues.  Pt referred for admission for crohns flare, n/v/d , abdominal pain.  Ct abd pelvis shows resolving absess with drain in place.  Review of Systems: As per HPI otherwise 10 point review of systems negative.   Past Medical History:  Diagnosis Date  . Anxiety   . Crohn's disease ([place])   . Diabetes type 1, uncontrolled ([place]) 11/14/2011   Since age 53  . Fibromyalgia   . Gastroparesis   . Hypertension   . Infection    UTI April 2016    Past Surgical History:  Procedure Laterality Date  . ANKLE SURGERY    . CHOLECYSTECTOMY  11/15/2011   Procedure: LAPAROSCOPIC CHOLECYSTECTOMY WITH INTRAOPERATIVE CHOLANGIOGRAM;  Surgeon: [name], MD;  Location: [facility];  Service: General;  Laterality: N/A;  . ESOPHAGOGASTRODUODENOSCOPY  12/03/2011   Procedure: ESOPHAGOGASTRODUODENOSCOPY (EGD);  Surgeon: Beryle Beams, MD;  Location: [facility];  Service: Endoscopy;  Laterality: N/A;  . FLEXIBLE SIGMOIDOSCOPY N/A  03/10/2017   Procedure: FLEXIBLE SIGMOIDOSCOPY;  Surgeon: Carol Ada, MD;  Location: [facility];  Service: Endoscopy;  Laterality: N/A;  . INCISION AND DRAINAGE PERIRECTAL ABSCESS N/A 03/01/2017   Procedure: IRRIGATION AND DEBRIDEMENT PERIRECTAL ABSCESS;  Surgeon: Alphonsa Overall, MD;  Location: [facility];  Service: General;  Laterality: N/A;  . IRRIGATION AND DEBRIDEMENT BUTTOCKS N/A 03/23/2017   Procedure: IRRIGATION AND DEBRIDEMENT BUTTOCKS, SETON PLACEMENT;  Surgeon: [name], MD;  Location: [facility];  Service: General;  Laterality: N/A;  . LAPAROSCOPY  11/23/2011   Procedure: LAPAROSCOPY DIAGNOSTIC;  Surgeon: Edward Jolly, MD;  Location: [facility];  Service: General;  Laterality: N/A;     reports that  has never smoked. she has never used smokeless tobacco. She reports that she drinks alcohol. She reports that she does not use drugs.  Allergies  Allergen Reactions  . Peanut-Containing Drug Products Anaphylaxis and Other (See Comments)    Pt states that she is allergic to [place] nuts; can tolerate peanuts  . Lactose Intolerance (Gi) Other (See Comments)    Gi upset     Family History  Problem Relation Age of Onset  . Diabetes Mother   . Hypertension Father     Prior to Admission medications   Medication Sig Start Date End Date Taking? Authorizing Provider  albuterol (PROVENTIL HFA;VENTOLIN HFA) 108 (90 Base) MCG/ACT inhaler Inhale 1-2 puffs into the lungs every 4 (four) hours as needed for wheezing or shortness of breath. 09/02/16  Yes Street, [place], [place]  cholestyramine light (PREVALITE) 4 g packet Take 1 packet (4 g total) by mouth 3 (three) times daily. 03/14/17  Yes Lavina Hamman, MD  citalopram (CELEXA) 20 MG tablet Take 1 tablet (20 mg total) by mouth daily. 03/15/17  Yes Lavina Hamman, MD  diphenoxylate-atropine (LOMOTIL) 2.5-0.025 MG tablet Take 2 tablets by mouth 4 (four) times daily. 04/08/17  Yes Henson, Vickie L, NP-C  EPINEPHrine (EPIPEN 2-PAK) 0.3 mg/0.3 mL  IJ SOAJ injection Inject 0.3 mLs (0.3 mg total) into the muscle as needed (allergic reaction). 03/21/17  Yes Aline August, MD  feeding supplement, GLUCERNA SHAKE, (GLUCERNA SHAKE) LIQD Take 237 mLs by mouth 2 (two) times daily between meals. 03/14/17  Yes Lavina Hamman, MD  ferrous sulfate 325 (65 FE) MG tablet Take 1 tablet (325 mg total) by mouth 2 (two) times daily with a meal. 03/14/17  Yes Lavina Hamman, MD  folic acid (FOLVITE) 1 MG tablet Take 1 tablet (1 mg total) by mouth daily. 03/15/17  Yes Lavina Hamman, MD  furosemide (LASIX) 40 MG tablet Take 1 tablet (40 mg total) by mouth daily. 03/21/17 04/20/17 Yes Aline August, MD  gabapentin (NEURONTIN) 300 MG capsule Take 1 capsule (300 mg total) by mouth 2 (two) times daily. 04/03/17  Yes Henson, Vickie L, NP-C  glucose 4 GM chewable tablet Chew 1 tablet (4 g total) by mouth as needed for low blood sugar. 08/10/14  Yes Denney, Rachelle A, CNM  Ibuprofen (MIDOL) 200 MG CAPS Take 400 mg by mouth 2 (two) times daily as needed (pain).    Yes [provider]  insulin lispro (HUMALOG) 100 UNIT/ML injection Inject 0.02 mLs (2 Units total) into the skin 3 (three) times daily with meals. 04/09/17  Yes Renato Shin, MD  LANTUS 100 UNIT/ML injection Inject 0.2 mLs (20 Units total) into the skin every morning. 04/07/17  Yes Renato Shin, MD  lipase/protease/amylase (CREON) 36000 UNITS CPEP capsule Take 1 capsule (36,000 Units total) by mouth 3 (three) times daily with meals. 03/14/17  Yes Lavina Hamman, MD  loperamide (IMODIUM) 2 MG capsule Take 1 capsule (2 mg total) by mouth 3 (three) times daily before meals. DO NOT TAKE IT FOR THE DAY YOU DONT HAVE A BOWEL MOVEMENT. 04/03/17  Yes Henson, Vickie L, NP-C  methocarbamol (ROBAXIN) 500 MG tablet Take 1 tablet (500 mg total) by mouth every 8 (eight) hours as needed for muscle spasms. 03/14/17  Yes Lavina Hamman, MD  metoCLOPramide (REGLAN) 5 MG tablet Take 5 mg by mouth 4 (four) times daily -   before meals and at bedtime.   Yes [provider]    Physical Exam: Vitals:   04/09/17 2041 04/10/17 0226 04/10/17 0522 04/10/17 0721  BP: (!) 184/121 (!) 180/114 (!) 145/87 (!) 159/97  Pulse: (!) 105 (!) 110  (!) 102  Resp: (!) 24 14  12   Temp: 98.2 F (36.8 C)     TempSrc: Oral     SpO2: 100% 100%  99%  Weight:      Height:          Constitutional: NAD, calm, comfortable Vitals:   04/09/17 2041 04/10/17 0226 04/10/17 0522 04/10/17 0721  BP: (!) 184/121 (!) 180/114 (!) 145/87 (!) 159/97  Pulse: (!) 105 (!) 110  (!) 102  Resp: (!) 24 14  12   Temp: 98.2 F (36.8 C)     TempSrc: Oral     SpO2: 100% 100%  99%  Weight:  Height:       Eyes: PERRL, lids and conjunctivae normal ENMT: Mucous membranes are moist. Posterior pharynx clear of any exudate or lesions.Normal dentition.  Neck: normal, supple, no masses, no thyromegaly Respiratory: clear to auscultation bilaterally, no wheezing, no crackles. Normal respiratory effort. No accessory muscle use.  Cardiovascular: Regular rate and rhythm, no murmurs / rubs / gallops. No extremity edema. 2+ pedal pulses. No carotid bruits.  Abdomen: no tenderness, no masses palpated. No hepatosplenomegaly. Bowel sounds positive.  Musculoskeletal: no clubbing / cyanosis. No joint deformity upper and lower extremities. Good ROM, no contractures. Normal muscle tone.  Skin: no rashes, lesions, ulcers. No induration Neurologic: CN 2-12 grossly intact. Sensation intact, DTR normal. Strength 5/5 in all 4.  Psychiatric: Normal judgment and insight. Alert and oriented x 3. Normal mood.  Rectal:  Much improved induration, drain in place, pt reports still drains  Labs on Admission: I have personally reviewed following labs and imaging studies  CBC: Recent Labs  Lab 04/06/17 1248 04/09/17 1610  WBC 4.6 4.8  HGB 10.9* 10.6*  HCT 34.4* 33.0*  MCV 91.5 90.4  PLT 273 329   Basic Metabolic Panel: Recent Labs  Lab 04/06/17 1248  04/09/17 1610  NA 142 137  K 3.6 4.0  CL 105 104  CO2 30 28  GLUCOSE 157* 126*  BUN 15 9  CREATININE 0.48 0.36*  CALCIUM 9.1 8.8*   GFR: Estimated Creatinine Clearance: 102.7 mL/min (A) (by C-G formula based on SCr of 0.36 mg/dL (L)). Liver Function Tests: Recent Labs  Lab 04/09/17 1610  AST 27  ALT 21  ALKPHOS 79  BILITOT 0.5  PROT 8.1  ALBUMIN 3.1*   Recent Labs  Lab 04/09/17 1610  LIPASE 14   No results for input(s): AMMONIA in the last 168 hours. Coagulation Profile: No results for input(s): INR, PROTIME in the last 168 hours. Cardiac Enzymes: No results for input(s): CKTOTAL, CKMB, CKMBINDEX, TROPONINI in the last 168 hours. BNP (last 3 results) No results for input(s): PROBNP in the last 8760 hours. HbA1C: No results for input(s): HGBA1C in the last 72 hours. CBG: Recent Labs  Lab 04/09/17 2350 04/10/17 0545  GLUCAP 192* 225*   Lipid Profile: No results for input(s): CHOL, HDL, LDLCALC, TRIG, CHOLHDL, LDLDIRECT in the last 72 hours. Thyroid Function Tests: No results for input(s): TSH, T4TOTAL, FREET4, T3FREE, THYROIDAB in the last 72 hours. Anemia Panel: No results for input(s): VITAMINB12, FOLATE, FERRITIN, TIBC, IRON, RETICCTPCT in the last 72 hours. Urine analysis:    Component Value Date/Time   COLORURINE STRAW (A) 04/10/2017 0126   APPEARANCEUR CLEAR 04/10/2017 0126   LABSPEC 1.012 04/10/2017 0126   PHURINE 8.0 04/10/2017 0126   GLUCOSEU 50 (A) 04/10/2017 0126   HGBUR MODERATE (A) 04/10/2017 0126   BILIRUBINUR NEGATIVE 04/10/2017 0126   BILIRUBINUR negative 01/02/2015 1717   KETONESUR 20 (A) 04/10/2017 0126   PROTEINUR 100 (A) 04/10/2017 0126   UROBILINOGEN 0.2 01/13/2015 0223   NITRITE NEGATIVE 04/10/2017 0126   LEUKOCYTESUR NEGATIVE 04/10/2017 0126   Sepsis Labs: !!!!!!!!!!!!!!!!!!!!!!!!!!!!!!!!!!!!!!!!!!!! @LABRCNTIP (procalcitonin:4,lacticidven:4) )No results found for this or any previous visit (from the past 240 hour(s)).    Radiological Exams on Admission: Ct Abdomen Pelvis W Contrast  Result Date: 04/10/2017 CLINICAL DATA:  27 year old female with abdominal pain. Nausea vomiting and diarrhea. EXAM: CT ABDOMEN AND PELVIS WITH CONTRAST TECHNIQUE: Multidetector CT imaging of the abdomen and pelvis was performed using the standard protocol following bolus administration of intravenous contrast. CONTRAST:  100 cc  Isovue-300 COMPARISON:  Pelvic CT dated 03/23/2017 FINDINGS: Lower chest: The visualized lung bases are clear. Top-normal cardiac size. The heart is only partially visualized. No intra-free air. There is diffuse mesenteric edema and stranding and trace free fluid within the pelvis. Hepatobiliary: Cholecystectomy. The liver is unremarkable. No intrahepatic biliary ductal dilatation. Pancreas: Unremarkable. No pancreatic ductal dilatation or surrounding inflammatory changes. Spleen: Normal in size without focal abnormality. Adrenals/Urinary Tract: The adrenal glands are unremarkable. There is no hydronephrosis on either side. There is symmetric enhancement of the kidneys. The urinary bladder is distended and grossly unremarkable. Stomach/Bowel: Evaluation of the bowel is limited in the absence of oral contrast. There is diffuse thickened appearance of the colon which may be reactive and secondary to anasarca or represent colitis. Loose stool noted in the colon compatible with diarrheal state. Correlation with clinical exam and stool cultures recommended. There is no bowel obstruction. Small tubular appearing structure medial to the cecum in the right lower quadrant may represent a normal appendix Vascular/Lymphatic: Abdominal aorta and IVC appear unremarkable. The origins of the celiac axis, SMA, and IMA are patent. No portal venous gas. There is no adenopathy. Top-normal bilateral inguinal lymph nodes noted, likely reactive. Reproductive: The uterus is grossly unremarkable. The ovaries are suboptimally visualized Other:  There is diffuse subcutaneous edema and anasarca. Patient is status post recent I&D of abscess over the sacrum. A drainage tube is seen in the intergluteal cleft and soft tissues superficial to the sacrum. There is small amount of loculated fluid in the subcutaneous soft tissues superficial to the right gluteal region measuring approximately 6 cm in length and 1.5 cm in thickness. This fluid may be partially organized. Musculoskeletal: No acute or significant osseous findings. IMPRESSION: 1. Status post recent incision and drainage of abscess superficial to the sacral region and drainage catheter placement. Small amount of loculated fluid is seen in the subcutaneous soft tissues superficial to the right gluteus muscle. 2. Diffuse mesenteric and subcutaneous soft tissue edema and anasarca. 3. Diarrheal state with diffusely thickened colon. Correlation with clinical exam and stool cultures recommended to evaluate for colitis. No bowel obstruction. Electronically Signed   By: Anner Crete M.D.   On: 04/10/2017 02:36    Old chart reviewed Case discussed with dr garder Case discussed with GI dr Collene Mares for consultation  Assessment/Plan 27 yo female with n/v/d and abdominal pain with questionable h/o crohns disease  Principal Problem:   Abdominal pain- I dont think she has crohns after reviewing her chart for an hour.  Her ct shows bowel wall edema which is chronic due to her overall malnourishment.  I saw her a month ago and she actually looks better with much less anasarca than before.  She reports she has stopped loosing weight since last admit.  abd exam is benign.  Have consulted GI to clarify  The crohns diagnosis.  Will not give iv solumedrol or antibiotics at this time.  Wbc is normal.  No fever.  Lactic nml.  She  Is much improved now after treatment received in the ED    Active Problems:   Diarrhea- is on creon, several antidiarrheals , cholestyramine.  On reglan also which query whether that is  contributing to diarrhea, defer to GI   Nausea and vomiting- seems to have resolved for now.  On reglan   Diabetes type 1, uncontrolled ([place])- ssi   Hypertension associated with diabetes ([place])- cont home meds   Gastroparesis- on reglan   Cellulitis and abscess of buttock-  seems to be improving.  Cont surg follow up as outpt     DVT prophylaxis:  scds Code Status:  full Family Communication:  none Disposition Plan:  Per day team Consults called:  GI dr Collene Mares called Admission status:  observation   Falon Huesca A MD [facility]  If 7PM-7AM, please contact night-coverage [email or web address] Password [email or web address]  04/10/2017, 9:01 AM

## 2017-04-11 DIAGNOSIS — E1043 Type 1 diabetes mellitus with diabetic autonomic (poly)neuropathy: Secondary | ICD-10-CM | POA: Diagnosis not present

## 2017-04-11 DIAGNOSIS — L0231 Cutaneous abscess of buttock: Secondary | ICD-10-CM | POA: Diagnosis not present

## 2017-04-11 DIAGNOSIS — Z79899 Other long term (current) drug therapy: Secondary | ICD-10-CM | POA: Diagnosis not present

## 2017-04-11 DIAGNOSIS — M545 Low back pain: Secondary | ICD-10-CM | POA: Diagnosis not present

## 2017-04-11 DIAGNOSIS — L03317 Cellulitis of buttock: Secondary | ICD-10-CM | POA: Diagnosis not present

## 2017-04-11 DIAGNOSIS — E43 Unspecified severe protein-calorie malnutrition: Secondary | ICD-10-CM | POA: Diagnosis not present

## 2017-04-11 DIAGNOSIS — E739 Lactose intolerance, unspecified: Secondary | ICD-10-CM | POA: Diagnosis present

## 2017-04-11 DIAGNOSIS — E10649 Type 1 diabetes mellitus with hypoglycemia without coma: Secondary | ICD-10-CM | POA: Diagnosis not present

## 2017-04-11 DIAGNOSIS — D509 Iron deficiency anemia, unspecified: Secondary | ICD-10-CM | POA: Diagnosis present

## 2017-04-11 DIAGNOSIS — G43A0 Cyclical vomiting, not intractable: Secondary | ICD-10-CM

## 2017-04-11 DIAGNOSIS — E1069 Type 1 diabetes mellitus with other specified complication: Secondary | ICD-10-CM | POA: Diagnosis present

## 2017-04-11 DIAGNOSIS — F418 Other specified anxiety disorders: Secondary | ICD-10-CM

## 2017-04-11 DIAGNOSIS — E1040 Type 1 diabetes mellitus with diabetic neuropathy, unspecified: Secondary | ICD-10-CM | POA: Diagnosis present

## 2017-04-11 DIAGNOSIS — F419 Anxiety disorder, unspecified: Secondary | ICD-10-CM | POA: Diagnosis present

## 2017-04-11 DIAGNOSIS — R1030 Lower abdominal pain, unspecified: Secondary | ICD-10-CM | POA: Diagnosis not present

## 2017-04-11 DIAGNOSIS — I10 Essential (primary) hypertension: Secondary | ICD-10-CM | POA: Diagnosis not present

## 2017-04-11 DIAGNOSIS — R188 Other ascites: Secondary | ICD-10-CM | POA: Diagnosis not present

## 2017-04-11 DIAGNOSIS — Z9049 Acquired absence of other specified parts of digestive tract: Secondary | ICD-10-CM | POA: Diagnosis not present

## 2017-04-11 DIAGNOSIS — R197 Diarrhea, unspecified: Secondary | ICD-10-CM | POA: Diagnosis not present

## 2017-04-11 DIAGNOSIS — Z9119 Patient's noncompliance with other medical treatment and regimen: Secondary | ICD-10-CM | POA: Diagnosis not present

## 2017-04-11 DIAGNOSIS — Z91018 Allergy to other foods: Secondary | ICD-10-CM | POA: Diagnosis not present

## 2017-04-11 DIAGNOSIS — G43A1 Cyclical vomiting, intractable: Secondary | ICD-10-CM | POA: Diagnosis not present

## 2017-04-11 DIAGNOSIS — F329 Major depressive disorder, single episode, unspecified: Secondary | ICD-10-CM | POA: Diagnosis present

## 2017-04-11 DIAGNOSIS — K5090 Crohn's disease, unspecified, without complications: Secondary | ICD-10-CM | POA: Diagnosis not present

## 2017-04-11 DIAGNOSIS — K3184 Gastroparesis: Secondary | ICD-10-CM | POA: Diagnosis not present

## 2017-04-11 DIAGNOSIS — M797 Fibromyalgia: Secondary | ICD-10-CM | POA: Diagnosis present

## 2017-04-11 DIAGNOSIS — Z6822 Body mass index (BMI) 22.0-22.9, adult: Secondary | ICD-10-CM | POA: Diagnosis not present

## 2017-04-11 DIAGNOSIS — Z833 Family history of diabetes mellitus: Secondary | ICD-10-CM | POA: Diagnosis not present

## 2017-04-11 DIAGNOSIS — E1159 Type 2 diabetes mellitus with other circulatory complications: Secondary | ICD-10-CM | POA: Diagnosis not present

## 2017-04-11 DIAGNOSIS — M5440 Lumbago with sciatica, unspecified side: Secondary | ICD-10-CM | POA: Diagnosis not present

## 2017-04-11 DIAGNOSIS — I152 Hypertension secondary to endocrine disorders: Secondary | ICD-10-CM | POA: Diagnosis not present

## 2017-04-11 DIAGNOSIS — R112 Nausea with vomiting, unspecified: Secondary | ICD-10-CM | POA: Diagnosis present

## 2017-04-11 LAB — GLUCOSE, CAPILLARY
Glucose-Capillary: 132 mg/dL — ABNORMAL HIGH (ref 65–99)
Glucose-Capillary: 126 mg/dL — ABNORMAL HIGH (ref 65–99)
Glucose-Capillary: 49 mg/dL — ABNORMAL LOW (ref 65–99)
Glucose-Capillary: 53 mg/dL — ABNORMAL LOW (ref 65–99)
Glucose-Capillary: 75 mg/dL (ref 65–99)
Glucose-Capillary: 232 mg/dL — ABNORMAL HIGH (ref 65–99)

## 2017-04-11 LAB — BASIC METABOLIC PANEL
Anion gap: 6 (ref 5–15)
BUN: 29 mg/dL — ABNORMAL HIGH (ref 6–20)
CO2: 26 mmol/L (ref 22–32)
Calcium: 8.8 mg/dL — ABNORMAL LOW (ref 8.9–10.3)
Chloride: 104 mmol/L (ref 101–111)
Creatinine, Ser: 0.52 mg/dL (ref 0.44–1.00)
GFR calc Af Amer: >60 mL/min (ref >60)
GFR calc non Af Amer: >60 mL/min (ref >60)
Glucose, Bld: 108 mg/dL — ABNORMAL HIGH (ref 65–99)
Potassium: 3.5 mmol/L (ref 3.5–5.1)
Sodium: 136 mmol/L (ref 135–145)

## 2017-04-11 LAB — CBC
HCT: 30.8 % — ABNORMAL LOW (ref 36.0–46.0)
Hemoglobin: 10 g/dL — ABNORMAL LOW (ref 12.0–15.0)
MCH: 29 pg (ref 26.0–34.0)
MCHC: 32.5 g/dL (ref 30.0–36.0)
MCV: 89.3 fL (ref 78.0–100.0)
Platelets: 242 10*3/uL (ref 150–400)
RBC: 3.45 MIL/uL — ABNORMAL LOW (ref 3.87–5.11)
RDW: 12.7 % (ref 11.5–15.5)
WBC: 6.1 10*3/uL (ref 4.0–10.5)

## 2017-04-11 MED ORDER — HYOSCYAMINE SULFATE 0.5 MG/ML IJ SOLN
0.5000 mg | Freq: Once | INTRAMUSCULAR | Status: AC
  Administered 2017-04-11: 0.5 mg via INTRAVENOUS
  Filled 2017-04-11: qty 1

## 2017-04-11 MED ORDER — METOCLOPRAMIDE HCL 5 MG/ML IJ SOLN
5.0000 mg | Freq: Four times a day (QID) | INTRAMUSCULAR | Status: DC
  Administered 2017-04-11 – 2017-04-16 (×18): 5 mg via INTRAVENOUS
  Filled 2017-04-11 (×18): qty 2

## 2017-04-11 MED ORDER — HYDROMORPHONE HCL 1 MG/ML IJ SOLN
2.0000 mg | Freq: Once | INTRAMUSCULAR | Status: AC
  Administered 2017-04-11: 2 mg via INTRAVENOUS
  Filled 2017-04-11: qty 2

## 2017-04-11 MED ORDER — PANTOPRAZOLE SODIUM 40 MG IV SOLR
40.0000 mg | INTRAVENOUS | Status: DC
  Administered 2017-04-11: 40 mg via INTRAVENOUS
  Filled 2017-04-11: qty 40

## 2017-04-11 MED ORDER — PROMETHAZINE HCL 25 MG/ML IJ SOLN
25.0000 mg | Freq: Three times a day (TID) | INTRAMUSCULAR | Status: DC | PRN
  Administered 2017-04-11 – 2017-04-16 (×4): 25 mg via INTRAVENOUS
  Filled 2017-04-11 (×4): qty 1

## 2017-04-11 MED ORDER — HYDROCODONE-ACETAMINOPHEN 5-325 MG PO TABS
1.0000 | ORAL_TABLET | Freq: Four times a day (QID) | ORAL | Status: DC | PRN
  Administered 2017-04-15: 1 via ORAL
  Filled 2017-04-11 (×3): qty 1

## 2017-04-11 NOTE — Progress Notes (Signed)
Pt in extreme pain in back, also with unrelieved n&v. Text page sent to Dr Gwenlyn Perking requesting IV med. Erick Oxendine, [employer]

## 2017-04-11 NOTE — Progress Notes (Signed)
cbg at 1644 = 49. Tray already before pt & pt instructed to eat. Recheck at 1703 =  53. Pt still eating. Will recheck in 15 min. Catori Panozzo, [employer]

## 2017-04-11 NOTE — Progress Notes (Signed)
Pt now also has c/o chest pain but can make pain increase with tactile pressure. Back pain & nausea remain unrelieved. Text message sent to Dr Gwenlyn Perking. [name], [employer]

## 2017-04-11 NOTE — Progress Notes (Signed)
Recheck cbg = 75. Will text to MD. Delise Simenson, Ladona Ridgel

## 2017-04-11 NOTE — Progress Notes (Addendum)
Progress Note Covering for Dr. Elnoria Howard   Subjective  Chief Complaint: nausea, vomiting and diarrhea  This morning the patient is found rolled on her side in her bed crying and in pain. She has an emesis basin in front of her with food material. She tells me that she has "terrible low back pain". Apparently, this was part of why she came in. She tells me the pain medicines they have given her since being on the floor are not touching it at all. She continues to cry throughout exam. She denies any stools this morning.     Objective   Vital signs in last 24 hours: Temp:  [97.5 F (36.4 C)-98.8 F (37.1 C)] 98.8 F (37.1 C) (01/12 0541) Pulse Rate:  [98-103] 98 (01/12 0541) Resp:  [14-17] 16 (01/12 0541) BP: (140-157)/(73-101) 145/73 (01/12 0541) SpO2:  [99 %-100 %] 99 % (01/12 0541) Last BM Date: 04/10/17 General:    AA female in mild distress, crying Heart:  Regular rate and rhythm; no murmurs Lungs: Respirations even and unlabored, lungs CTA bilaterally Abdomen:  Soft, nontender and nondistended. Normal bowel sounds. Extremities:  Without edema. MSK: Low back ttp Neurologic:  Alert and oriented,  grossly normal neurologically. Psych:  Cooperative. Normal mood and affect.  Intake/Output from previous day: 01/11 0701 - 01/12 0700 In: 1627.5 [P.O.:890; I.V.:737.5] Out: -   Lab Results: Recent Labs    04/09/17 1610 04/11/17 0541  WBC 4.8 6.1  HGB 10.6* 10.0*  HCT 33.0* 30.8*  PLT 237 242   BMET Recent Labs    04/09/17 1610 04/11/17 0541  NA 137 136  K 4.0 3.5  CL 104 104  CO2 28 26  GLUCOSE 126* 108*  BUN 9 29*  CREATININE 0.36* 0.52  CALCIUM 8.8* 8.8*   LFT Recent Labs    04/09/17 1610  PROT 8.1  ALBUMIN 3.1*  AST 27  ALT 21  ALKPHOS 79  BILITOT 0.5   Studies/Results: Ct Abdomen Pelvis W Contrast  Result Date: 04/10/2017 CLINICAL DATA:  27 year old female with abdominal pain. Nausea vomiting and diarrhea. EXAM: CT ABDOMEN AND PELVIS WITH CONTRAST  TECHNIQUE: Multidetector CT imaging of the abdomen and pelvis was performed using the standard protocol following bolus administration of intravenous contrast. CONTRAST:  100 cc Isovue-300 COMPARISON:  Pelvic CT dated 03/23/2017 FINDINGS: Lower chest: The visualized lung bases are clear. Top-normal cardiac size. The heart is only partially visualized. No intra-free air. There is diffuse mesenteric edema and stranding and trace free fluid within the pelvis. Hepatobiliary: Cholecystectomy. The liver is unremarkable. No intrahepatic biliary ductal dilatation. Pancreas: Unremarkable. No pancreatic ductal dilatation or surrounding inflammatory changes. Spleen: Normal in size without focal abnormality. Adrenals/Urinary Tract: The adrenal glands are unremarkable. There is no hydronephrosis on either side. There is symmetric enhancement of the kidneys. The urinary bladder is distended and grossly unremarkable. Stomach/Bowel: Evaluation of the bowel is limited in the absence of oral contrast. There is diffuse thickened appearance of the colon which may be reactive and secondary to anasarca or represent colitis. Loose stool noted in the colon compatible with diarrheal state. Correlation with clinical exam and stool cultures recommended. There is no bowel obstruction. Small tubular appearing structure medial to the cecum in the right lower quadrant may represent a normal appendix Vascular/Lymphatic: Abdominal aorta and IVC appear unremarkable. The origins of the celiac axis, SMA, and IMA are patent. No portal venous gas. There is no adenopathy. Top-normal bilateral inguinal lymph nodes noted, likely reactive. Reproductive: The  uterus is grossly unremarkable. The ovaries are suboptimally visualized Other: There is diffuse subcutaneous edema and anasarca. Patient is status post recent I&D of abscess over the sacrum. A drainage tube is seen in the intergluteal cleft and soft tissues superficial to the sacrum. There is small  amount of loculated fluid in the subcutaneous soft tissues superficial to the right gluteal region measuring approximately 6 cm in length and 1.5 cm in thickness. This fluid may be partially organized. Musculoskeletal: No acute or significant osseous findings. IMPRESSION: 1. Status post recent incision and drainage of abscess superficial to the sacral region and drainage catheter placement. Small amount of loculated fluid is seen in the subcutaneous soft tissues superficial to the right gluteus muscle. 2. Diffuse mesenteric and subcutaneous soft tissue edema and anasarca. 3. Diarrheal state with diffusely thickened colon. Correlation with clinical exam and stool cultures recommended to evaluate for colitis. No bowel obstruction. Electronically Signed   By: Elgie Collard M.D.   On: 04/10/2017 02:36       Assessment / Plan:   Assessment: 1. Gastroparesis 2. Nausea and Vomiting: patient continues with vomiting this morning which she tells me is in response to her acute back pain, apparently did well overnight 3. Diarrhea: Controlled  Plan: 1. Per Dr. Elnoria Howard, continue with supportive care 2. I did contact Dr. Gwenlyn Perking at time of my exam due to patient's severe back pain. Will allow him to make further decisions regarding pain management. 3. Please await any new recommendations from Dr. Marina Goodell later today  We will continue to follow for Dr. Elnoria Howard over the weekend.  LOS: 0 days  Unk Lightning  04/11/2017, 9:16 AM  Pager # [phone]  GI ATTENDING (covering for Dr. Elnoria Howard)  Interval history data reviewed. Patient seen and examined. Agree with interval progress note. Signed out as diabetic gastroparesis with nausea and vomiting less severe than previous hospitalization. However, this morning the patient is complaining of excruciating low back pain. She is sitting on the side of the bed holding her lower back and crying. Husband at bedside. Her abdomen is soft. Intermittent nausea with vomiting  may be secondary to gastroparesis or a be secondary to back pain. The hospitalist team to round on her and have been informed by nursing regarding her back pain. At this standpoint continue supportive GI measures as outlined by Dr. Elnoria Howard and sort out her back pain issues. If her back pain issues have been sorted out and are resolved I would imagine that her GI complaints were also improved. If not, reconsult GI (Dr. Elnoria Howard who will be available Monday morning). Thank you  Wilhemina Bonito. Eda Keys., M.D. [facility] Division of Gastroenterology

## 2017-04-11 NOTE — Progress Notes (Signed)
[facility] PROGRESS NOTE  [name]:[id number] DOB: [birth year] DOA: 04/09/2017 PCP: Girtha Rm, NP-C  Interim summary and HPI 27 y.o. female with medical history significant of IDDM poorly controlled, gastroparesis with abnormal emptying study in 2013, anxiety, fibromyalgia, htn, recent perirectal absess which has resolved then soon after gluteal absess which has been drained by general surgery on 12/24 with a course of abx still with drain in place. Patient presented with intractable nausea, vomiting and abd pain.   Assessment/Plan: 1-nausea, vomiting and midepigastric discomfort: Secondary to gastroparesis flare -Patient diet downgraded to full liquid -Medications transition to IV as much as possible -Will use of Reglan -Continue as needed antiemetics -Started PPI -continue supportive care and follow clinical response   2-chronic diarrhea -Continue the use of Lomotil, cholestyramine And Imodium -Will also continue the use of Creon -Patient reported that her diarrhea has reduced to more than 3 episodes per day.  3-type 1 diabetes: With gastroparesis and neuropathy -Under better control -Continue sliding scale insulin and adjust the dose of Lantus -Patient levels are very brittle and will need to be careful especially when having difficulties to take by mouth intake. -Repeat A1c demonstrated to be in the 5.7-6 range. -Continue the use of Reglan  4-cellulitis/abscess of the buttock -Continue outpatient follow-up with general surgery. -Patient has completed antibiotic therapy -Still with drain in place.  5-severe protein calorie malnutrition: with hypoalbuminemia and underlying anasarca secondary to third spacing. -continue feeding supplements -overall condition improved  6-anxiety/depression -continue celexa   Code Status: Full code Family Communication: Husband at bedside Disposition Plan: Remains inpatient, at this moment medications will be  transitioned as much as possible to IV route; provide pain medication to assist with her symptoms.  Continue antiemetics.  Diet change to full liquid.  Will follow CBGs closely to prevent hypoglycemia.   Consultants:  GI  Procedures:  See below for x-ray reports.  Antibiotics:  None  HPI/Subjective: Afebrile, patient in acute distress complaining of lower back pain, mid epigastric discomfort, active nausea and vomiting.  Unable to keep things down.  Objective: Vitals:   04/10/17 2019 04/11/17 0541  BP: (!) 142/101 (!) 145/73  Pulse: (!) 103 98  Resp: 15 16  Temp: (!) 97.5 F (36.4 C) 98.8 F (37.1 C)  SpO2: 100% 99%    Intake/Output Summary (Last 24 hours) at 04/11/2017 1714 Last data filed at 04/11/2017 1200 Gross per 24 hour  Intake 2637.5 ml  Output -  Net 2637.5 ml   Filed Weights   04/09/17 1434  Weight: 68 kg (150 lb)    Exam:   General: No fever.  Patient complaining of mid epigastric discomfort, actively throwing up and having significant lower back pain.  She is in no acute distress and unable to keep medications or diet down at this moment.  Cardiovascular: S1 and S2, no rubs, no gallops  Respiratory: Good air movement bilaterally, no wheezing or crackles.  Abdomen: Soft, no distention, positive bowel sounds; mild epigastric discomfort with deep palpation.  Musculoskeletal: 1+ edema bilaterally, no cyanosis or clubbing.  Data Reviewed: Basic Metabolic Panel: Recent Labs  Lab 04/06/17 1248 04/09/17 1610 04/11/17 0541  NA 142 137 136  K 3.6 4.0 3.5  CL 105 104 104  CO2 30 28 26   GLUCOSE 157* 126* 108*  BUN 15 9 29*  CREATININE 0.48 0.36* 0.52  CALCIUM 9.1 8.8* 8.8*   Liver Function Tests: Recent Labs  Lab 04/09/17 1610  AST 27  ALT 21  ALKPHOS 79  BILITOT 0.5  PROT 8.1  ALBUMIN 3.1*   Recent Labs  Lab 04/09/17 1610  LIPASE 14   CBC: Recent Labs  Lab 04/06/17 1248 04/09/17 1610 04/11/17 0541  WBC 4.6 4.8 6.1  HGB 10.9*  10.6* 10.0*  HCT 34.4* 33.0* 30.8*  MCV 91.5 90.4 89.3  PLT 273 237 242    CBG: Recent Labs  Lab 04/10/17 2021 04/11/17 0844 04/11/17 1158 04/11/17 1644 04/11/17 1703  GLUCAP 427* 132* 126* 49* 53*    Studies: Ct Abdomen Pelvis W Contrast  Result Date: 04/10/2017 CLINICAL DATA:  27 year old female with abdominal pain. Nausea vomiting and diarrhea. EXAM: CT ABDOMEN AND PELVIS WITH CONTRAST TECHNIQUE: Multidetector CT imaging of the abdomen and pelvis was performed using the standard protocol following bolus administration of intravenous contrast. CONTRAST:  100 cc Isovue-300 COMPARISON:  Pelvic CT dated 03/23/2017 FINDINGS: Lower chest: The visualized lung bases are clear. Top-normal cardiac size. The heart is only partially visualized. No intra-free air. There is diffuse mesenteric edema and stranding and trace free fluid within the pelvis. Hepatobiliary: Cholecystectomy. The liver is unremarkable. No intrahepatic biliary ductal dilatation. Pancreas: Unremarkable. No pancreatic ductal dilatation or surrounding inflammatory changes. Spleen: Normal in size without focal abnormality. Adrenals/Urinary Tract: The adrenal glands are unremarkable. There is no hydronephrosis on either side. There is symmetric enhancement of the kidneys. The urinary bladder is distended and grossly unremarkable. Stomach/Bowel: Evaluation of the bowel is limited in the absence of oral contrast. There is diffuse thickened appearance of the colon which may be reactive and secondary to anasarca or represent colitis. Loose stool noted in the colon compatible with diarrheal state. Correlation with clinical exam and stool cultures recommended. There is no bowel obstruction. Small tubular appearing structure medial to the cecum in the right lower quadrant may represent a normal appendix Vascular/Lymphatic: Abdominal aorta and IVC appear unremarkable. The origins of the celiac axis, SMA, and IMA are patent. No portal venous gas.  There is no adenopathy. Top-normal bilateral inguinal lymph nodes noted, likely reactive. Reproductive: The uterus is grossly unremarkable. The ovaries are suboptimally visualized Other: There is diffuse subcutaneous edema and anasarca. Patient is status post recent I&D of abscess over the sacrum. A drainage tube is seen in the intergluteal cleft and soft tissues superficial to the sacrum. There is small amount of loculated fluid in the subcutaneous soft tissues superficial to the right gluteal region measuring approximately 6 cm in length and 1.5 cm in thickness. This fluid may be partially organized. Musculoskeletal: No acute or significant osseous findings. IMPRESSION: 1. Status post recent incision and drainage of abscess superficial to the sacral region and drainage catheter placement. Small amount of loculated fluid is seen in the subcutaneous soft tissues superficial to the right gluteus muscle. 2. Diffuse mesenteric and subcutaneous soft tissue edema and anasarca. 3. Diarrheal state with diffusely thickened colon. Correlation with clinical exam and stool cultures recommended to evaluate for colitis. No bowel obstruction. Electronically Signed   By: Anner Crete M.D.   On: 04/10/2017 02:36    Scheduled Meds: . cholestyramine light  4 g Oral 3 times per day  . citalopram  20 mg Oral Daily  . diphenoxylate-atropine  2 tablet Oral 4 times per day  . feeding supplement (GLUCERNA SHAKE)  237 mL Oral BID BM  . ferrous sulfate  325 mg Oral BID WC  . folic acid  1 mg Oral Daily  . gabapentin  300 mg Oral BID  . gi cocktail  30 mL Oral Once  . insulin aspart  0-9 Units Subcutaneous TID WC  . insulin glargine  20 Units Subcutaneous Daily  . lipase/protease/amylase  36,000 Units Oral TID WC  . loperamide  2 mg Oral TID AC  . metoCLOPramide (REGLAN) injection  5 mg Intravenous Q6H   Continuous Infusions: . sodium chloride 125 mL/hr at 04/10/17 1016    Principal Problem:   Abdominal pain Active  Problems:   Diabetes type 1, uncontrolled ([place])   Hypertension associated with diabetes ([place])   Gastroparesis   Diarrhea   Cellulitis and abscess of buttock   Nausea and vomiting   Time spent: 30 minutes   [facility] Pager [phone]. If 7PM-7AM, please contact night-coverage at [email or web address], password [facility] 04/11/2017, 5:14 PM  LOS: 0 days

## 2017-04-12 LAB — GLUCOSE, CAPILLARY
Glucose-Capillary: 285 mg/dL — ABNORMAL HIGH (ref 65–99)
Glucose-Capillary: 153 mg/dL — ABNORMAL HIGH (ref 65–99)
Glucose-Capillary: 111 mg/dL — ABNORMAL HIGH (ref 65–99)
Glucose-Capillary: 21 mg/dL — CL (ref 65–99)
Glucose-Capillary: 128 mg/dL — ABNORMAL HIGH (ref 65–99)
Glucose-Capillary: 39 mg/dL — CL (ref 65–99)
Glucose-Capillary: 66 mg/dL (ref 65–99)

## 2017-04-12 LAB — BASIC METABOLIC PANEL
Anion gap: 7 (ref 5–15)
BUN: 13 mg/dL (ref 6–20)
CO2: 26 mmol/L (ref 22–32)
Calcium: 8.6 mg/dL — ABNORMAL LOW (ref 8.9–10.3)
Chloride: 99 mmol/L — ABNORMAL LOW (ref 101–111)
Creatinine, Ser: 0.32 mg/dL — ABNORMAL LOW (ref 0.44–1.00)
GFR calc Af Amer: >60 mL/min (ref >60)
GFR calc non Af Amer: >60 mL/min (ref >60)
Glucose, Bld: 227 mg/dL — ABNORMAL HIGH (ref 65–99)
Potassium: 3.6 mmol/L (ref 3.5–5.1)
Sodium: 132 mmol/L — ABNORMAL LOW (ref 135–145)

## 2017-04-12 LAB — CBC
HCT: 37.1 % (ref 36.0–46.0)
Hemoglobin: 12.4 g/dL (ref 12.0–15.0)
MCH: 29.3 pg (ref 26.0–34.0)
MCHC: 33.4 g/dL (ref 30.0–36.0)
MCV: 87.7 fL (ref 78.0–100.0)
Platelets: 242 10*3/uL (ref 150–400)
RBC: 4.23 MIL/uL (ref 3.87–5.11)
RDW: 12.3 % (ref 11.5–15.5)
WBC: 5.6 10*3/uL (ref 4.0–10.5)

## 2017-04-12 LAB — GLUCOSE, RANDOM: Glucose, Bld: 100 mg/dL — ABNORMAL HIGH (ref 65–99)

## 2017-04-12 MED ORDER — METHOCARBAMOL 1000 MG/10ML IJ SOLN
1000.0000 mg | Freq: Once | INTRAMUSCULAR | Status: AC
  Administered 2017-04-12: 1000 mg via INTRAMUSCULAR
  Filled 2017-04-12: qty 10

## 2017-04-12 MED ORDER — DEXTROSE 50 % IV SOLN
INTRAVENOUS | Status: AC
  Administered 2017-04-12: 50 mL
  Filled 2017-04-12: qty 50

## 2017-04-12 MED ORDER — DEXTROSE-NACL 5-0.9 % IV SOLN
INTRAVENOUS | Status: DC
  Administered 2017-04-12 – 2017-04-14 (×4): via INTRAVENOUS

## 2017-04-12 MED ORDER — HYDROMORPHONE HCL 1 MG/ML IJ SOLN
2.0000 mg | Freq: Three times a day (TID) | INTRAMUSCULAR | Status: DC | PRN
  Administered 2017-04-12 – 2017-04-16 (×11): 2 mg via INTRAVENOUS
  Filled 2017-04-12 (×13): qty 2

## 2017-04-12 MED ORDER — PANTOPRAZOLE SODIUM 40 MG IV SOLR
40.0000 mg | Freq: Two times a day (BID) | INTRAVENOUS | Status: DC
  Administered 2017-04-12 – 2017-04-14 (×5): 40 mg via INTRAVENOUS
  Filled 2017-04-12 (×5): qty 40

## 2017-04-12 MED ORDER — HYDROMORPHONE HCL 1 MG/ML IJ SOLN: 2.0000 mg | Freq: Three times a day (TID) | INTRAMUSCULAR | Status: DC | PRN

## 2017-04-12 MED ORDER — KETOROLAC TROMETHAMINE 30 MG/ML IJ SOLN
30.0000 mg | Freq: Once | INTRAMUSCULAR | Status: AC
  Administered 2017-04-12: 30 mg via INTRAVENOUS
  Filled 2017-04-12: qty 1

## 2017-04-12 NOTE — Progress Notes (Signed)
Stat glucose was 100 at 2234. Follow-up CBG was 66 at 2258. Another 25cc of D50% was given at 2307. CBG now is 128. Will continue to monitor patient for further signs and symptoms of hypoglycemia.

## 2017-04-12 NOTE — Progress Notes (Signed)
Patient's CBG was 21 at 2205. Dextrose 50% (25cc) given at 2213. Dr Bruna Potter notified at approximately 2216. Stat glucose ordered at 2224. Follow-up CBG at 2233 was 39. The other 25cc of D 50% administered at 2236. Patient remains alert and verbally responsive.

## 2017-04-12 NOTE — Progress Notes (Signed)
[facility] PROGRESS NOTE  [name] AST:[id number] DOB: 04/02/1990 DOA: 04/09/2017 PCP: Girtha Rm, NP-C  Interim summary and HPI 27 y.o. female with medical history significant of IDDM poorly controlled, gastroparesis with abnormal emptying study in 2013, anxiety, fibromyalgia, htn, recent perirectal absess which has resolved then soon after gluteal absess which has been drained by general surgery on 12/24 with a course of abx still with drain in place. Patient presented with intractable nausea, vomiting and abd pain.   Assessment/Plan: 1-nausea, vomiting and midepigastric discomfort: Secondary to gastroparesis flare -Patient diet downgraded to NPO due to ongoing N/V and abd pain. -Medications transition to IV as much as possible -Will continue using IV Reglan -Continue PRN antiemetics -continue IV PPI for now -continue supportive care and follow clinical response  -pain meds to be given in for the next 24-48 hours to assist cooling off her condition and then once again resume slowly advancing diet and minimizing/avoiding narcotics later on.  2-chronic diarrhea -Continue the use of Lomotil, cholestyramine And Imodium -Will continue the use of Creon -Patient reported that her diarrhea has reduced to no more than 3 episodes per day. -continue to monitor  3-type 1 diabetes: With gastroparesis and neuropathy -Under better control overall -Continue sliding scale insulin and adjust the dose of Lantus (as patient is not eating much) -Patient levels are very brittle and will need to be careful especially when having difficulties to take by mouth intake. -Repeat A1c demonstrated to be in the 5.7- 6 range. -Continue the use of IV Reglan while unable to take PO's  4-cellulitis/abscess of the buttock -Continue outpatient follow-up with general surgery. -Patient has completed antibiotic therapy -Still with drain in place.  5-severe protein calorie malnutrition: with  hypoalbuminemia and underlying anasarca secondary to third spacing. -continue feeding supplements as soon as able to tolerates PO's -advise to maintain adequate hydration and once eating again, to not skip meals.   6-anxiety/depression -continue celexa -no SI or hallucinations    Code Status: Full code Family Communication: no family at bedside currently  Disposition Plan: Remains inpatient, at this moment medications has been transitioned as much as possible to IV route; will provide pain medication to assist with her symptoms.  Continue PRN antiemetics.  Diet change to NPO.  Will follow CBGs closely to prevent hypoglycemia.   Consultants:  GI  Procedures:  See below for x-ray reports.  Antibiotics:  None  HPI/Subjective: In Major distress secondary to abdominal pain, and ongoing nausea and vomiting.  No fever, no hematemesis, no melena, no hematochezia, no hematuria, no shortness of breath.  Able to tolerate anything by mouth at this moment.  Objective: Vitals:   04/11/17 2127 04/12/17 0607  BP: (!) 160/97 (!) 171/96  Pulse: (!) 101 (!) 103  Resp: 16 16  Temp: 98.1 F (36.7 C) 98.3 F (36.8 C)  SpO2: 100% 100%    Intake/Output Summary (Last 24 hours) at 04/12/2017 1345 Last data filed at 04/11/2017 2123 Gross per 24 hour  Intake 1893.6 ml  Output -  Net 1893.6 ml   Filed Weights   04/09/17 1434 04/12/17 0607  Weight: 68 kg (150 lb) 66.5 kg (146 lb 9.7 oz)    Exam:   General: No fever, complaining of midepigastric pain and lower chest; ongoing nausea and vomiting with difficulty to keep anything down.  Patient in major distress.  No shortness of breath.  There has not been any hematemesis and is otherwise denied any further complaints.   Cardiovascular: Mild  tachycardia, no rubs, no gallops, no JVD.  Respiratory: Clear to auscultation bilaterally, normal respiratory effort.  Abdomen: Soft, no distention, no guarding; vague tenderness to palpation in  epigastric region.  Musculoskeletal: 1+ edema bilaterally, no cyanosis, no clubbing.  Data Reviewed: Basic Metabolic Panel: Recent Labs  Lab 04/06/17 1248 04/09/17 1610 04/11/17 0541 04/12/17 0607  NA 142 137 136 132*  K 3.6 4.0 3.5 3.6  CL 105 104 104 99*  CO2 30 28 26 26   GLUCOSE 157* 126* 108* 227*  BUN 15 9 29* 13  CREATININE 0.48 0.36* 0.52 0.32*  CALCIUM 9.1 8.8* 8.8* 8.6*   Liver Function Tests: Recent Labs  Lab 04/09/17 1610  AST 27  ALT 21  ALKPHOS 79  BILITOT 0.5  PROT 8.1  ALBUMIN 3.1*   Recent Labs  Lab 04/09/17 1610  LIPASE 14   CBC: Recent Labs  Lab 04/06/17 1248 04/09/17 1610 04/11/17 0541 04/12/17 0607  WBC 4.6 4.8 6.1 5.6  HGB 10.9* 10.6* 10.0* 12.4  HCT 34.4* 33.0* 30.8* 37.1  MCV 91.5 90.4 89.3 87.7  PLT 273 237 242 242    CBG: Recent Labs  Lab 04/11/17 1703 04/11/17 1719 04/11/17 2126 04/12/17 0740 04/12/17 1146  GLUCAP 53* 75 232* 285* 153*    Studies: No results found.  Scheduled Meds: . cholestyramine light  4 g Oral 3 times per day  . citalopram  20 mg Oral Daily  . diphenoxylate-atropine  2 tablet Oral 4 times per day  . ferrous sulfate  325 mg Oral BID WC  . folic acid  1 mg Oral Daily  . gabapentin  300 mg Oral BID  . gi cocktail  30 mL Oral Once  . insulin aspart  0-9 Units Subcutaneous TID WC  . insulin glargine  20 Units Subcutaneous Daily  . lipase/protease/amylase  36,000 Units Oral TID WC  . loperamide  2 mg Oral TID AC  . metoCLOPramide (REGLAN) injection  5 mg Intravenous Q6H  . pantoprazole (PROTONIX) IV  40 mg Intravenous Q12H   Continuous Infusions: . dextrose 5 % and 0.9% NaCl 100 mL/hr at 04/12/17 7121   Time spent: 30 minutes   [facility] Pager [phone]. If 7PM-7AM, please contact night-coverage at [email or web address], password [email or web address] 04/12/2017, 1:45 PM  LOS: 1 day

## 2017-04-12 NOTE — Progress Notes (Signed)
Heard patient actively vomiting in her room. Noted large amount of undigested vomitus in garbage bin. Patient crying due to severe back pain and stated she had poor relief with Toradol yesterday. Heating pad in place and page sent to on-call MD Bruna Potter) for further instructions.

## 2017-04-12 NOTE — Progress Notes (Signed)
Nurse paged Dr. Gwenlyn Perking to make him aware of patient requesting pain medication for upper stomach and chest pain. Patient had EKG completed during the night due to chest pain. Nurse is waiting for Dr. Gwenlyn Perking to return phone call.

## 2017-04-12 NOTE — Progress Notes (Signed)
Nurse paged Dr. Gwenlyn Perking. Pt requesting to see provider.

## 2017-04-13 DIAGNOSIS — G43A1 Cyclical vomiting, intractable: Secondary | ICD-10-CM

## 2017-04-13 LAB — GLUCOSE, CAPILLARY
Glucose-Capillary: 97 mg/dL (ref 65–99)
Glucose-Capillary: 103 mg/dL — ABNORMAL HIGH (ref 65–99)
Glucose-Capillary: 149 mg/dL — ABNORMAL HIGH (ref 65–99)
Glucose-Capillary: 175 mg/dL — ABNORMAL HIGH (ref 65–99)

## 2017-04-13 MED ORDER — INSULIN GLARGINE 100 UNIT/ML ~~LOC~~ SOLN
5.0000 [IU] | Freq: Every day | SUBCUTANEOUS | Status: DC
  Administered 2017-04-13 – 2017-04-16 (×4): 5 [IU] via SUBCUTANEOUS
  Filled 2017-04-13 (×4): qty 0.05

## 2017-04-13 NOTE — Progress Notes (Signed)
  Request seen to evaluate fluid collection/abscess.  Images reviewed by Dr. Richarda Overlie.  Abscess cavity appears decompressed. Nothing to drain at this time.  Burlin Mcnair S Lavinia Mcneely PA-C 04/13/2017 2:06 PM

## 2017-04-13 NOTE — Progress Notes (Signed)
Inpatient Diabetes Program Recommendations  AACE/ADA: New Consensus Statement on Inpatient Glycemic Control (2015)  Target Ranges:  Prepandial:   less than 140 mg/dL      Peak postprandial:   less than 180 mg/dL (1-2 hours)      Critically ill patients:  140 - 180 mg/dL   Lab Results  Component Value Date   GLUCAP 97 04/13/2017   HGBA1C 5.7 (H) 04/10/2017    Review of Glycemic ControlResults for EILEENA, ROADCAP" (MRN [id number]) as of 04/13/2017 08:24  Ref. Range 04/12/2017 07:40 04/12/2017 11:46 04/12/2017 17:07 04/12/2017 22:05 04/12/2017 22:34 04/12/2017 22:58 04/12/2017 23:24 04/13/2017 07:23  Glucose-Capillary Latest Ref Range: 65 - 99 mg/dL 409 (H) 811 (H) 914 (H) 21 (LL) 39 (LL) 66 128 (H) 97    Diabetes history: Type 1 DM Outpatient Diabetes medications: Lantus 20 units daily, Humalog 2 units tid with meals, Humalog 0-9 units tid with meals Current orders for Inpatient glycemic control:  Lantus 20 units daily, Novolog 0-9 units tid with meals Inpatient Diabetes Program Recommendations:   Patient is NPO. Note lows last PM.  May consider reducing Novolog to custom scale that starts at 151 mg/dL-200 mg/dL- 1 unit, 782-956 mg/dL-2 units, 213-086 mg/dL-3 units, 578-469 mg/dL-4 units, 629-528 mg/dL- 5 units.    Thanks,  Beryl Meager, RN, BC-ADM Inpatient Diabetes Coordinator Pager [phone] (8a-5p)

## 2017-04-13 NOTE — Progress Notes (Signed)
PROGRESS NOTE    [name]  ZOX:[id number] DOB: [birth year] DOA: 04/09/2017 PCP: Girtha Rm, NP-C    Brief Narrative:  27 year old female who presented with nausea, vomiting and abdominal pain. Patient does have the significant past medical history of type 1 diabetes mellitus, gastroparesis, anxiety, fibromyalgia, hypertension, and a recent perirectal abscess. On initial physical examination blood pressure 184/121, heart rate 105, respiratory rate 24, temperature 98.2, oxygen saturation 100%. Mucous membranes were moist, lungs are clear to auscultation bilaterally, heart S1-S2 present rhythmic, abdomen was soft nontender, lower extremity with no edema. Sodium 137, potassium 4.0, chloride 104, bicarbonate 28, glucose 126, BUN 9, creatinine 0.36, white count 4.8, hemoglobin 10.6, hematocrit 33.0, platelets 237, urinalysis glucose 50, specific gravity 1.012, too numerous to count RBCs, white cell count 0-5. CT of the abdomen with diffuse mesenteric and subcutaneous soft tissue edema, diffusely thickened colon, no bowel obstruction, drainage tube seen in the intergluteal cleft and soft tissues superficial to the sacrum. Loculated fluid measures 6 cm in length and 1.5 centimeters thick, partially organized in the subcutaneous soft tissues superficial to the right gluteal region. EKG normal sinus rhythm, QTc 480, 72 bpm.  Patient was admitted to the hospital working diagnosis of gastroparesis acute flare.  Assessment & Plan:   Principal Problem:   Abdominal pain Active Problems:   Diabetes type 1, uncontrolled ([facility])   Hypertension associated with diabetes ([place])   Gastroparesis   Diarrhea   Cellulitis and abscess of buttock   Nausea and vomiting   Intractable nausea and vomiting  1. Gastroparesis flare. Clinically has improved, will advance diet to clears, will continue IV fluids, antiacids and as needed antiemetics (metoclopramide and zofran)  2. Hypoglycemia. Will resume low dose of  long acting insulin 5 units and continue insulins sliding scale for glucose cover and monitoring. Patient with high risk for hyperglycemia due to T1DM.   3. Type 1 Dm. With episodes of hypoglycemia, will continue close monitoring of capillary glucose, advance diet as tolerated, low dose basal insulin.   4. Crohn's disease with chronic diarrhea. Stable, with no signs of exacerbation, will continue close monitoring.   5. Calorie protein malnutrition. Advance diet as tolerated, will continue nutritional supplements.   6. Anxiety and depression. No signs of agitation or confusion, will continue close monitoring. Will continue citalopram.    7. Iron deficiency anemia. Continue iron supplementation, no current indication for PRBC transfusion.    DVT prophylaxis: enoxaparin  Code Status:  full Family Communication: no family at the bedside Disposition Plan: home   Consultants:     Procedures:     Antimicrobials:       Subjective: Patient feeling better, improved appetite, no nausea or vomiting, no abdominal pain, patient developed hypoglycemia last night, on IV dextrose.   Objective: Vitals:   04/12/17 0607 04/12/17 1559 04/12/17 2244 04/13/17 0630  BP: (!) 171/96 109/78 114/77 (!) 144/98  Pulse: (!) 103 86 84 93  Resp: 16 16 14 16   Temp: 98.3 F (36.8 C) 98.2 F (36.8 C) (!) 97.5 F (36.4 C) 98.1 F (36.7 C)  TempSrc: Oral Oral Oral Oral  SpO2: 100% 100% 100% 100%  Weight: 66.5 kg (146 lb 9.7 oz)     Height:        Intake/Output Summary (Last 24 hours) at 04/13/2017 0956 Last data filed at 04/13/2017 0740 Gross per 24 hour  Intake 3248.9 ml  Output -  Net 3248.9 ml   Filed Weights   04/09/17 1434 04/12/17 8119  Weight: 68 kg (150 lb) 66.5 kg (146 lb 9.7 oz)    Examination:   General: deconditioned Neurology: Awake and alert, non focal  E ENT: mild pallor, no icterus, oral mucosa moist Cardiovascular: No JVD. S1-S2 present, rhythmic, no gallops, rubs, or  murmurs. No lower extremity edema. Pulmonary: vesicular breath sounds bilaterally, adequate air movement, no wheezing, rhonchi or rales. Gastrointestinal. Abdomen flat, no organomegaly, non tender, no rebound or guarding Skin. No rashes Musculoskeletal: no joint deformities     Data Reviewed: I have personally reviewed following labs and imaging studies  CBC: Recent Labs  Lab 04/06/17 1248 04/09/17 1610 04/11/17 0541 04/12/17 0607  WBC 4.6 4.8 6.1 5.6  HGB 10.9* 10.6* 10.0* 12.4  HCT 34.4* 33.0* 30.8* 37.1  MCV 91.5 90.4 89.3 87.7  PLT 273 237 242 264   Basic Metabolic Panel: Recent Labs  Lab 04/06/17 1248 04/09/17 1610 04/11/17 0541 04/12/17 0607 04/12/17 2234  NA 142 137 136 132*  --   K 3.6 4.0 3.5 3.6  --   CL 105 104 104 99*  --   CO2 30 28 26 26   --   GLUCOSE 157* 126* 108* 227* 100*  BUN 15 9 29* 13  --   CREATININE 0.48 0.36* 0.52 0.32*  --   CALCIUM 9.1 8.8* 8.8* 8.6*  --    GFR: Estimated Creatinine Clearance: 102.7 mL/min (A) (by C-G formula based on SCr of 0.32 mg/dL (L)). Liver Function Tests: Recent Labs  Lab 04/09/17 1610  AST 27  ALT 21  ALKPHOS 79  BILITOT 0.5  PROT 8.1  ALBUMIN 3.1*   Recent Labs  Lab 04/09/17 1610  LIPASE 14   No results for input(s): AMMONIA in the last 168 hours. Coagulation Profile: No results for input(s): INR, PROTIME in the last 168 hours. Cardiac Enzymes: No results for input(s): CKTOTAL, CKMB, CKMBINDEX, TROPONINI in the last 168 hours. BNP (last 3 results) No results for input(s): PROBNP in the last 8760 hours. HbA1C: Recent Labs    04/10/17 1641  HGBA1C 5.7*   CBG: Recent Labs  Lab 04/12/17 2205 04/12/17 2234 04/12/17 2258 04/12/17 2324 04/13/17 0723  GLUCAP 21* 39* 66 128* 97   Lipid Profile: No results for input(s): CHOL, HDL, LDLCALC, TRIG, CHOLHDL, LDLDIRECT in the last 72 hours. Thyroid Function Tests: No results for input(s): TSH, T4TOTAL, FREET4, T3FREE, THYROIDAB in the last 72  hours. Anemia Panel: No results for input(s): VITAMINB12, FOLATE, FERRITIN, TIBC, IRON, RETICCTPCT in the last 72 hours.    Radiology Studies: I have reviewed all of the imaging during this hospital visit personally     Scheduled Meds: . cholestyramine light  4 g Oral 3 times per day  . citalopram  20 mg Oral Daily  . diphenoxylate-atropine  2 tablet Oral 4 times per day  . ferrous sulfate  325 mg Oral BID WC  . folic acid  1 mg Oral Daily  . gabapentin  300 mg Oral BID  . gi cocktail  30 mL Oral Once  . insulin aspart  0-9 Units Subcutaneous TID WC  . insulin glargine  20 Units Subcutaneous Daily  . lipase/protease/amylase  36,000 Units Oral TID WC  . loperamide  2 mg Oral TID AC  . metoCLOPramide (REGLAN) injection  5 mg Intravenous Q6H  . pantoprazole (PROTONIX) IV  40 mg Intravenous Q12H   Continuous Infusions: . dextrose 5 % and 0.9% NaCl 100 mL/hr at 04/12/17 2230     LOS: 2 days  Mauricio Gerome Apley, MD [facility] Pager [phone]

## 2017-04-14 LAB — CBC WITH DIFFERENTIAL/PLATELET
Basophils Absolute: 0 10*3/uL (ref 0.0–0.1)
Basophils Relative: 1 %
Eosinophils Absolute: 0.1 10*3/uL (ref 0.0–0.7)
Eosinophils Relative: 3 %
HCT: 31.9 % — ABNORMAL LOW (ref 36.0–46.0)
Hemoglobin: 10.6 g/dL — ABNORMAL LOW (ref 12.0–15.0)
Lymphocytes Relative: 58 %
Lymphs Abs: 2.8 10*3/uL (ref 0.7–4.0)
MCH: 29.4 pg (ref 26.0–34.0)
MCHC: 33.2 g/dL (ref 30.0–36.0)
MCV: 88.6 fL (ref 78.0–100.0)
Monocytes Absolute: 0.4 10*3/uL (ref 0.1–1.0)
Monocytes Relative: 7 %
Neutro Abs: 1.5 10*3/uL — ABNORMAL LOW (ref 1.7–7.7)
Neutrophils Relative %: 31 %
Platelets: 203 10*3/uL (ref 150–400)
RBC: 3.6 MIL/uL — ABNORMAL LOW (ref 3.87–5.11)
RDW: 12.3 % (ref 11.5–15.5)
WBC: 4.9 10*3/uL (ref 4.0–10.5)

## 2017-04-14 LAB — GLUCOSE, CAPILLARY
Glucose-Capillary: 91 mg/dL (ref 65–99)
Glucose-Capillary: 156 mg/dL — ABNORMAL HIGH (ref 65–99)
Glucose-Capillary: 160 mg/dL — ABNORMAL HIGH (ref 65–99)
Glucose-Capillary: 231 mg/dL — ABNORMAL HIGH (ref 65–99)

## 2017-04-14 LAB — BASIC METABOLIC PANEL
Anion gap: 5 (ref 5–15)
BUN: 10 mg/dL (ref 6–20)
CO2: 25 mmol/L (ref 22–32)
Calcium: 8.4 mg/dL — ABNORMAL LOW (ref 8.9–10.3)
Chloride: 108 mmol/L (ref 101–111)
Creatinine, Ser: 0.32 mg/dL — ABNORMAL LOW (ref 0.44–1.00)
GFR calc Af Amer: >60 mL/min (ref >60)
GFR calc non Af Amer: >60 mL/min (ref >60)
Glucose, Bld: 116 mg/dL — ABNORMAL HIGH (ref 65–99)
Potassium: 3.9 mmol/L (ref 3.5–5.1)
Sodium: 138 mmol/L (ref 135–145)

## 2017-04-14 MED ORDER — PANTOPRAZOLE SODIUM 40 MG PO TBEC
40.0000 mg | DELAYED_RELEASE_TABLET | Freq: Two times a day (BID) | ORAL | Status: DC
  Administered 2017-04-14 – 2017-04-16 (×4): 40 mg via ORAL
  Filled 2017-04-14 (×4): qty 1

## 2017-04-14 NOTE — Progress Notes (Signed)
Pharmacy IV to PO conversion  The patient is receiving Pantoprazole by the intravenous route.  Based on criteria approved by the Pharmacy and Therapeutics Committee and the Medical Executive Committee, the medication is being converted to the equivalent oral dose form.   No active GI bleeding or impaired absorption  Here for gastroparesis flare, but seems mostly resolved, and taking PO meds x 3 days now  Not s/p esophagectomy  Documented ability to take oral medications for > 24 hr  Plan to continue treatment for at least 1 day  If you have any questions about this conversion, please contact the Pharmacy Department (ext [phone]).  Thank you.  Bernadene Person, PharmD Pager: [phone] 04/14/2017, 3:16 PM

## 2017-04-14 NOTE — Progress Notes (Addendum)
PROGRESS NOTE    RONNAE KASER  MWN:[id number] DOB: 01/18/1991 DOA: 04/09/2017 PCP: Girtha Rm, NP-C    Brief Narrative:  27 year old female who presented with nausea, vomiting and abdominal pain. Patient does have the significant past medical history of type 1 diabetes mellitus, gastroparesis, anxiety, fibromyalgia, hypertension, and a recent perirectal abscess. On initial physical examination blood pressure 184/121, heart rate 105, respiratory rate 24, temperature 98.2, oxygen saturation 100%. Mucous membranes were moist, lungs were clear to auscultation bilaterally, heart S1-S2 present rhythmic, abdomen was soft nontender, lower extremity with no edema. Sodium 137, potassium 4.0, chloride 104, bicarbonate 28, glucose 126, BUN 9, creatinine 0.36, white count 4.8, hemoglobin 10.6, hematocrit 33.0, platelets 237, urinalysis glucose 50, specific gravity 1.012, too numerous to count RBCs, white cell count 0-5. CT of the abdomen with diffuse mesenteric and subcutaneous soft tissue edema, diffusely thickened colon, no bowel obstruction, drainage tube seen in the intergluteal cleft and soft tissues superficial to the sacrum. Loculated fluid measures 6 cm in length and 1.5 centimeters thick, partially organized in the subcutaneous soft tissues superficial to the right gluteal region. EKG normal sinus rhythm, QTc 480, 72 bpm.  Patient was admitted to the hospital working diagnosis of gastroparesis acute flare.   Assessment & Plan:   Principal Problem:   Abdominal pain Active Problems:   Diabetes type 1, uncontrolled (HCC)   Hypertension associated with diabetes ([place])   Gastroparesis   Diarrhea   Cellulitis and abscess of buttock   Nausea and vomiting   Intractable nausea and vomiting  1. Gastroparesis flare. Continue clinical improvement, will advance diet to regular diabetic, and discontinue IV fluids. On antiacids and as needed antiemetics (metoclopramide, promethazine and zofran).    2. Hypoglycemia. Tolerating well low dose of long acting insulin with 5 units q 24 hours. On insulin sliding scale for glucose cover and monitoring. Capillary glucose 103, 149, 175, 91, 156.   3. Type 1 Dm. Currently tolerating well low dose of basal insulin, plant to continue close monitoring of capillary glucose. Diet advanced to regular.   4. Crohn's disease with chronic diarrhea. No signs of exacerbation.  5. Calorie protein malnutrition. Nutritional supplements , diet now up to regular.   6. Anxiety and depression. No agitation or confusion, continue citalopram per home regimen.    7. Iron deficiency anemia. Iron supplementation. hb at 10,6 and Hct at 31.9.   8. Gluteal abscess. Clinically improving, drain in place. Imaging reviewed by IR, no need for extra drains, will continue close monitoring and follow up as outpatient.    DVT prophylaxis: enoxaparin  Code Status:  full Family Communication: no family at the bedside Disposition Plan: home   Consultants:     Procedures:     Antimicrobials:      Subjective: Patient feeling better, nausea and vomiting have improved, abdominal pain improving, has been on clear liquid diet and IV dextrose. No dyspnea or chest pain, has been out of bed, positive bowel movement.   Objective: Vitals:   04/12/17 2244 04/13/17 0630 04/13/17 1344 04/14/17 0654  BP: 114/77 (!) 144/98 (!) 146/102 (!) 130/96  Pulse: 84 93 95 88  Resp: 14 16 18 18   Temp: (!) 97.5 F (36.4 C) 98.1 F (36.7 C) 98 F (36.7 C) 97.9 F (36.6 C)  TempSrc: Oral Oral Oral   SpO2: 100% 100% 100% 95%  Weight:      Height:        Intake/Output Summary (Last 24 hours) at 04/14/2017 1206  Last data filed at 04/14/2017 0747 Gross per 24 hour  Intake 958.17 ml  Output -  Net 958.17 ml   Filed Weights   04/09/17 1434 04/12/17 5784  Weight: 68 kg (150 lb) 66.5 kg (146 lb 9.7 oz)    Examination:   General: Not in pain or dyspnea,  deconditioned Neurology: Awake and alert, non focal  E ENT: mild pallor, no icterus, oral mucosa moist Cardiovascular: No JVD. S1-S2 present, rhythmic, no gallops, rubs, or murmurs. No lower extremity edema. Pulmonary: vesicular breath sounds bilaterally, adequate air movement, no wheezing, rhonchi or rales. Gastrointestinal. Abdomen with mild distention, no organomegaly, non tender to superficial palpation, no rebound or guarding Skin. No rashes Musculoskeletal: no joint deformities     Data Reviewed: I have personally reviewed following labs and imaging studies  CBC: Recent Labs  Lab 04/09/17 1610 04/11/17 0541 04/12/17 0607 04/14/17 0620  WBC 4.8 6.1 5.6 4.9  NEUTROABS  --   --   --  1.5*  HGB 10.6* 10.0* 12.4 10.6*  HCT 33.0* 30.8* 37.1 31.9*  MCV 90.4 89.3 87.7 88.6  PLT 237 242 242 696   Basic Metabolic Panel: Recent Labs  Lab 04/09/17 1610 04/11/17 0541 04/12/17 0607 04/12/17 2234 04/14/17 0620  NA 137 136 132*  --  138  K 4.0 3.5 3.6  --  3.9  CL 104 104 99*  --  108  CO2 28 26 26   --  25  GLUCOSE 126* 108* 227* 100* 116*  BUN 9 29* 13  --  10  CREATININE 0.36* 0.52 0.32*  --  0.32*  CALCIUM 8.8* 8.8* 8.6*  --  8.4*   GFR: Estimated Creatinine Clearance: 102.7 mL/min (A) (by C-G formula based on SCr of 0.32 mg/dL (L)). Liver Function Tests: Recent Labs  Lab 04/09/17 1610  AST 27  ALT 21  ALKPHOS 79  BILITOT 0.5  PROT 8.1  ALBUMIN 3.1*   Recent Labs  Lab 04/09/17 1610  LIPASE 14   No results for input(s): AMMONIA in the last 168 hours. Coagulation Profile: No results for input(s): INR, PROTIME in the last 168 hours. Cardiac Enzymes: No results for input(s): CKTOTAL, CKMB, CKMBINDEX, TROPONINI in the last 168 hours. BNP (last 3 results) No results for input(s): PROBNP in the last 8760 hours. HbA1C: No results for input(s): HGBA1C in the last 72 hours. CBG: Recent Labs  Lab 04/13/17 0723 04/13/17 1218 04/13/17 1728 04/13/17 2050  04/14/17 0815  GLUCAP 97 103* 149* 175* 91   Lipid Profile: No results for input(s): CHOL, HDL, LDLCALC, TRIG, CHOLHDL, LDLDIRECT in the last 72 hours. Thyroid Function Tests: No results for input(s): TSH, T4TOTAL, FREET4, T3FREE, THYROIDAB in the last 72 hours. Anemia Panel: No results for input(s): VITAMINB12, FOLATE, FERRITIN, TIBC, IRON, RETICCTPCT in the last 72 hours.    Radiology Studies: I have reviewed all of the imaging during this hospital visit personally     Scheduled Meds: . cholestyramine light  4 g Oral 3 times per day  . citalopram  20 mg Oral Daily  . diphenoxylate-atropine  2 tablet Oral 4 times per day  . ferrous sulfate  325 mg Oral BID WC  . folic acid  1 mg Oral Daily  . gabapentin  300 mg Oral BID  . gi cocktail  30 mL Oral Once  . insulin aspart  0-9 Units Subcutaneous TID WC  . insulin glargine  5 Units Subcutaneous Daily  . lipase/protease/amylase  36,000 Units Oral TID WC  .  loperamide  2 mg Oral TID AC  . metoCLOPramide (REGLAN) injection  5 mg Intravenous Q6H  . pantoprazole (PROTONIX) IV  40 mg Intravenous Q12H   Continuous Infusions:   LOS: 3 days        Rakan Soffer Gerome Apley, MD [facility] Pager [phone]

## 2017-04-15 ENCOUNTER — Inpatient Hospital Stay (HOSPITAL_COMMUNITY): Payer: 59

## 2017-04-15 DIAGNOSIS — R1030 Lower abdominal pain, unspecified: Secondary | ICD-10-CM

## 2017-04-15 LAB — BASIC METABOLIC PANEL
Anion gap: 5 (ref 5–15)
BUN: 11 mg/dL (ref 6–20)
CO2: 25 mmol/L (ref 22–32)
Calcium: 8.7 mg/dL — ABNORMAL LOW (ref 8.9–10.3)
Chloride: 106 mmol/L (ref 101–111)
Creatinine, Ser: 0.41 mg/dL — ABNORMAL LOW (ref 0.44–1.00)
GFR calc Af Amer: >60 mL/min (ref >60)
GFR calc non Af Amer: >60 mL/min (ref >60)
Glucose, Bld: 88 mg/dL (ref 65–99)
Potassium: 4 mmol/L (ref 3.5–5.1)
Sodium: 136 mmol/L (ref 135–145)

## 2017-04-15 LAB — GLUCOSE, CAPILLARY
Glucose-Capillary: 81 mg/dL (ref 65–99)
Glucose-Capillary: 175 mg/dL — ABNORMAL HIGH (ref 65–99)
Glucose-Capillary: 147 mg/dL — ABNORMAL HIGH (ref 65–99)
Glucose-Capillary: 127 mg/dL — ABNORMAL HIGH (ref 65–99)

## 2017-04-15 IMAGING — CR DG LUMBAR SPINE COMPLETE 4+V
4 series · 4 of 4 positions shown · non-contrast
Comparison: Abdominal and pelvic CT scan [DATE]

CLINICAL DATA: One week of back pain possibly related to a sacral
abscess which was recently drained.

EXAM:
LUMBAR SPINE - COMPLETE 4+ VIEW

[t l-spine oblique exposure (1 of 2)]
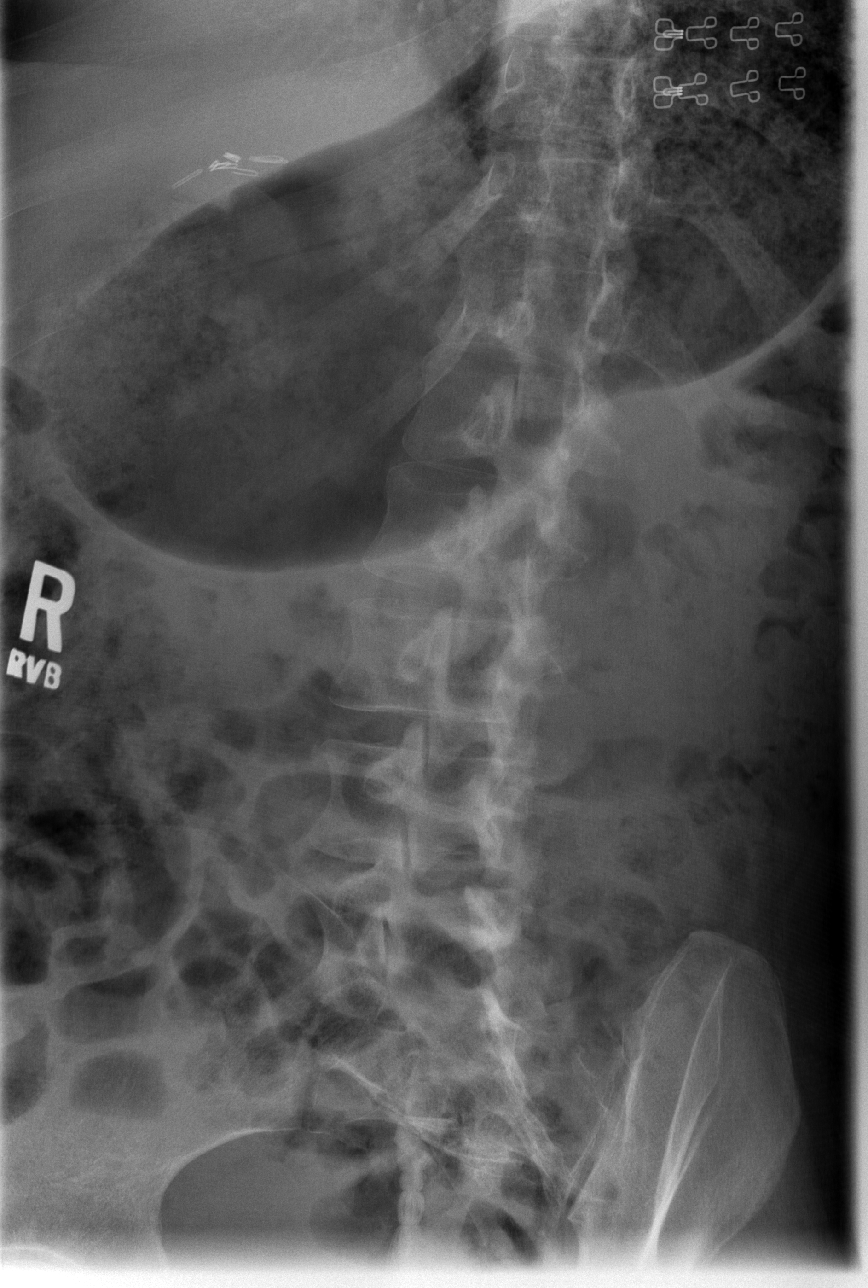

[t l-spine oblique exposure (2 of 2)]
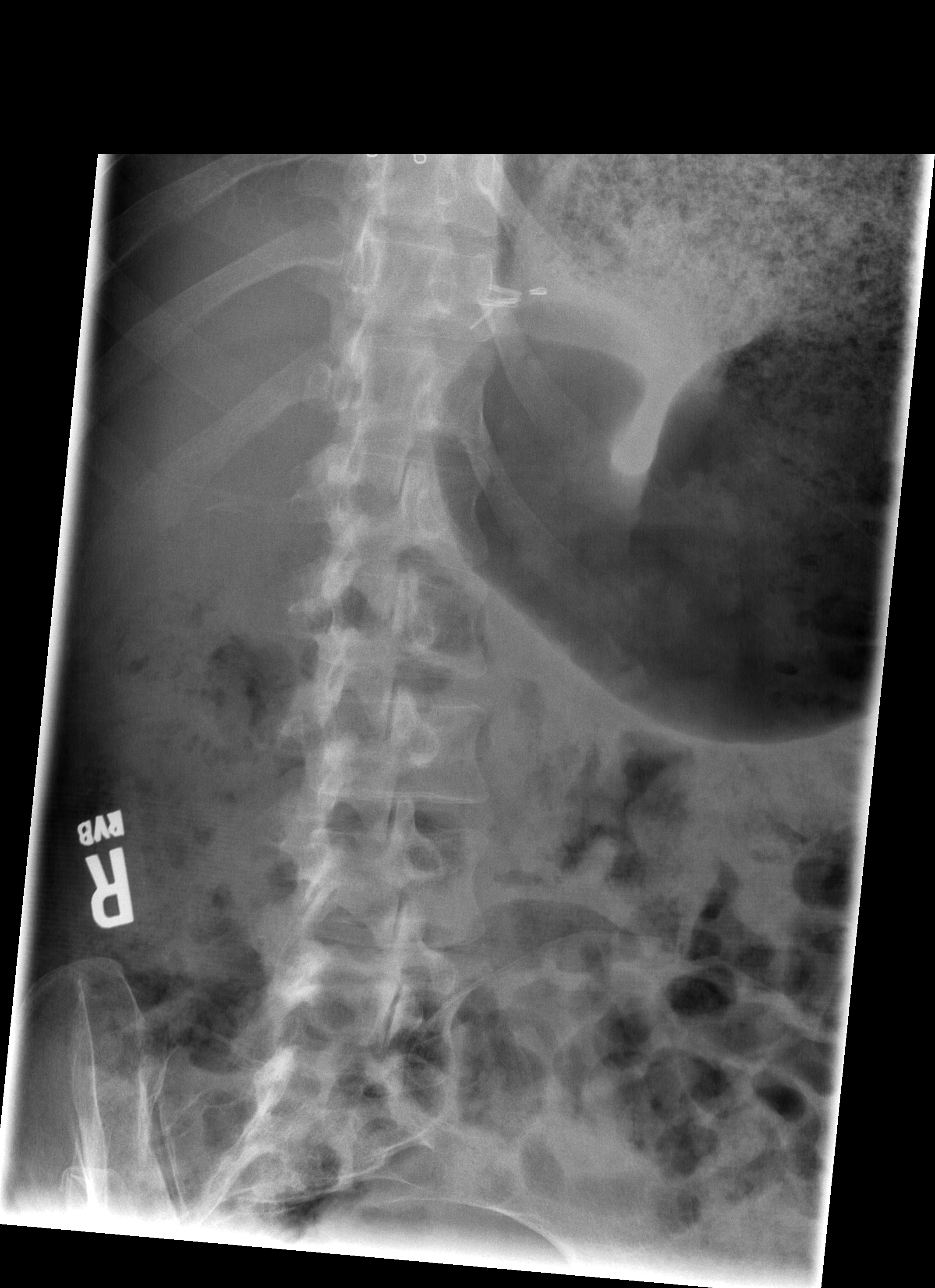

[t l-spine lat]
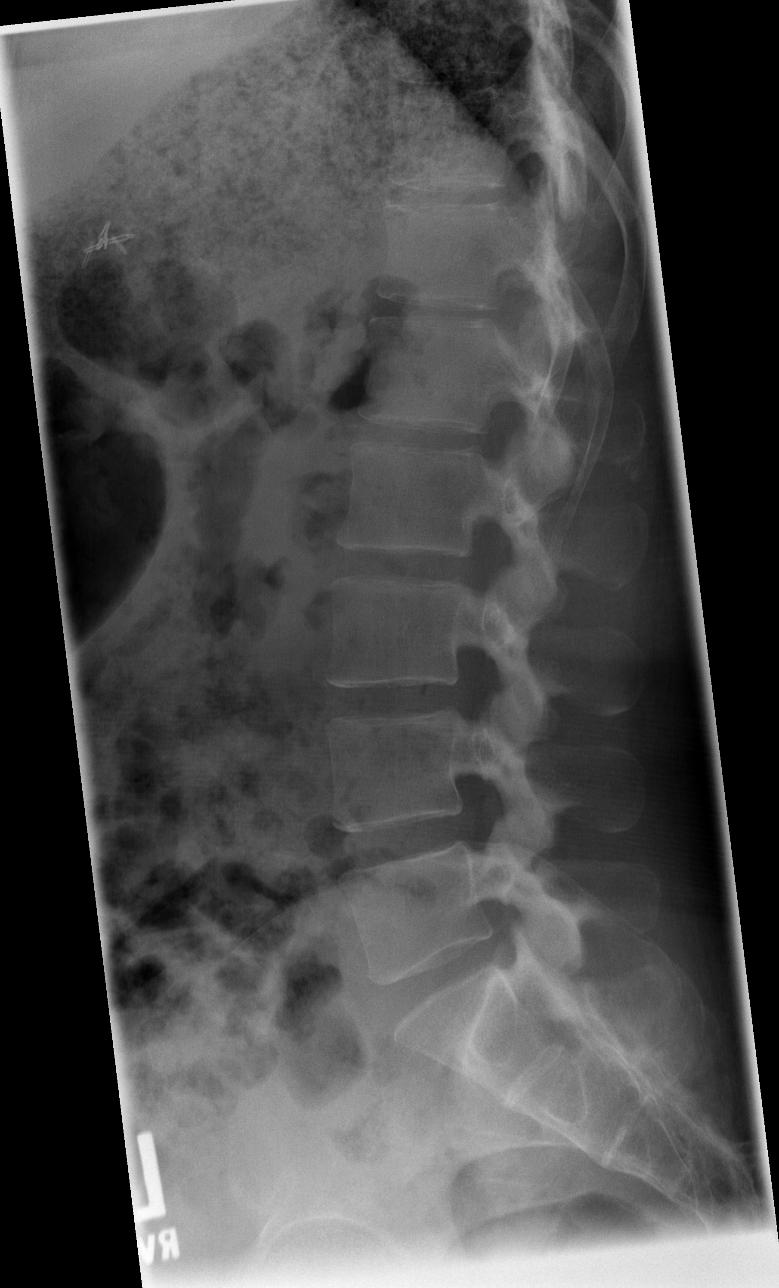

[t l-spine l5-s1 spot]
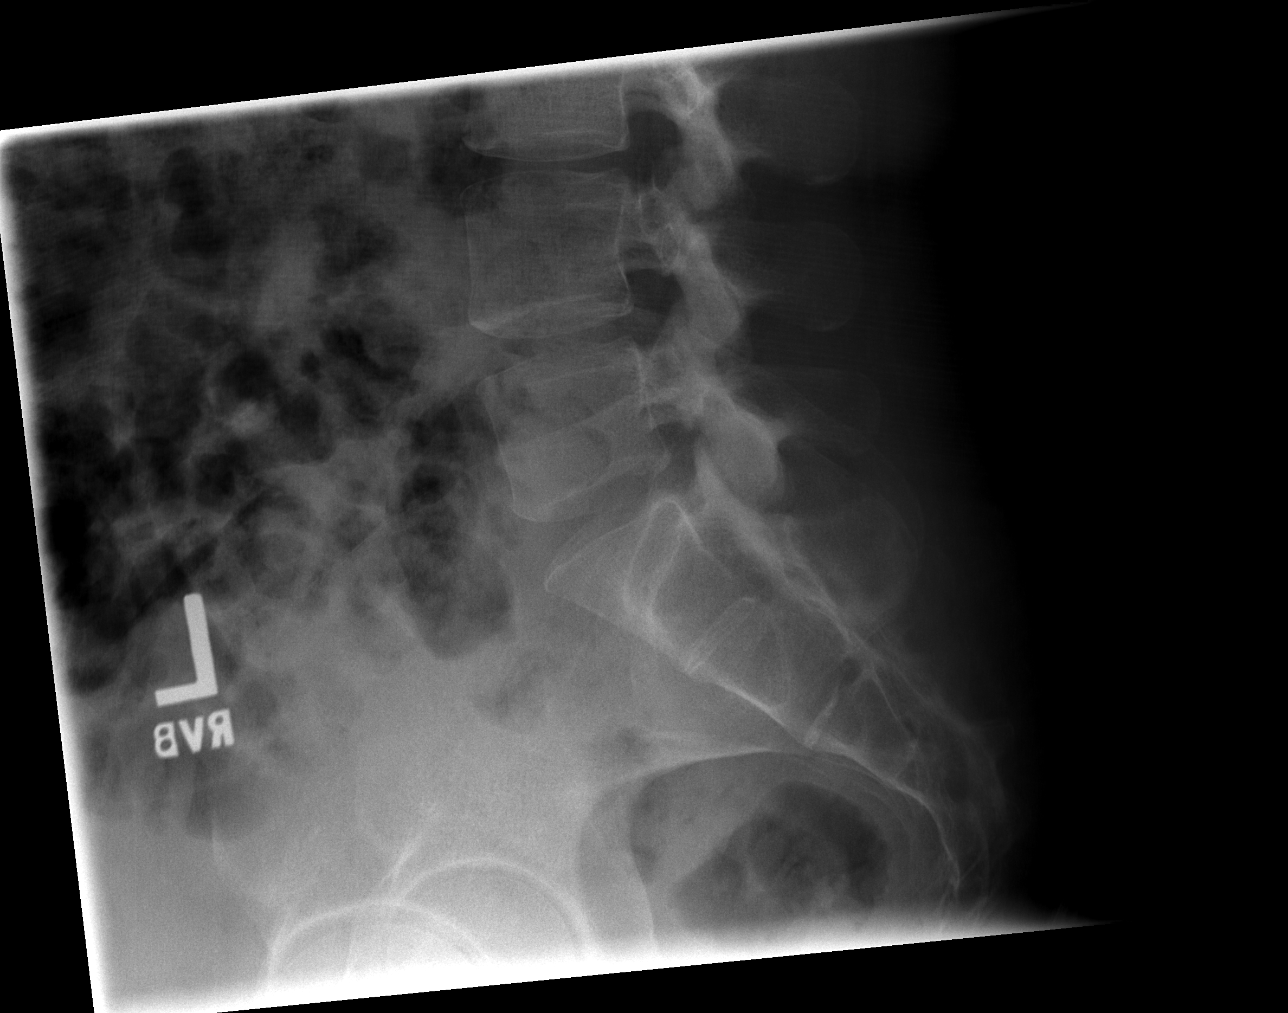

[4 of 4 positions shown; findings below may reference images not displayed]

FINDINGS: The lumbar vertebral bodies are preserved in height. The disc space
heights are well maintained. There is no spondylolisthesis. The
pedicles and transverse processes are intact where visualized.
IMPRESSION: There is no acute or significant chronic bony abnormality of the
lumbar spine.

## 2017-04-15 NOTE — Progress Notes (Signed)
PROGRESS NOTE   [name]  KMM:[id number] DOB: December 27, 1990 DOA: 04/09/2017   PCP: Avanell Shackleton, NP-C   Brief Narrative:  Patient is 27 year old female with diabetes type 1, gastroparesis, recent perirectal abscess. Patient presented with nausea and vomiting, abdominal pain that was thought to be due to gastroparesis flare. CT of the abdomen notable for diffuse mesenteric and subcutaneous soft tissue edema, diffusely thickened colon with no bowel obstruction. Drainage tube noted in intergluteal cleft and soft tissues superficial. Loculated fluid measures 6 cm in length and 1.5 cm thick.  Assessment & Plan:   Principal Problem:   Abdominal pain with nausea and vomiting - Secondary to gastroparesis flare - Patient reports feeling better overall still with intermittent episodes of nausea but has not been vomiting - So far tolerating diet well - Has been receiving antiemetics - Also on metoclopramide  Active Problems:   Diabetes type 1, uncontrolled (HCC) - With intermittent hypoglycemic events - Keep on current regimen with insulin    Hypertension associated with diabetes (HCC) - Reasonable inpatient control    Back pain - Will request x-ray of the lumbosacral area - Unclear etiology    Diarrhea, Crohn's disease - Patient reports worsening diarrhea overnight, worse compared to her baseline - Will observe for additional 24 hours and if no changes, plan to discharge in the morning    Cellulitis and abscess of buttock - Clinically improving, drain in place, imaging studies reviewed by IR, no need for extra drains  DVT prophylaxis: enoxaparin Code Status:  full Family Communication: no family at the bedside Disposition Plan: home in am if diarrhea better  Consultants:   None  Procedures:   None  Antimicrobials:     None  Subjective: Patient reports feeling better, had more nausea overnight. No vomiting. Patient reports finishing about 50% of the meals at  the time. Patient also concerned with sudden onset of lower back pain. Worsening diarrhea.   Objective: Vitals:   04/14/17 0654 04/14/17 1521 04/14/17 2046 04/15/17 0534  BP: (!) 130/96 112/75 120/74 123/79  Pulse: 88 97 100 94  Resp: 18 18 17 16   Temp: 97.9 F (36.6 C) 98.1 F (36.7 C) 98.1 F (36.7 C) 98.1 F (36.7 C)  TempSrc:  Oral Oral Oral  SpO2: 95% 100% 100% 98%  Weight:      Height:        Intake/Output Summary (Last 24 hours) at 04/15/2017 1219 Last data filed at 04/15/2017 0600 Gross per 24 hour  Intake 1060 ml  Output 0 ml  Net 1060 ml   Filed Weights   04/09/17 1434 04/12/17 0607  Weight: 68 kg (150 lb) 66.5 kg (146 lb 9.7 oz)   Physical Exam  Constitutional: Appears calm, not in acute distress CVS: RRR, S1/S2 +, no murmurs, no gallops, no carotid bruit.  Pulmonary: Effort and breath sounds normal, no stridor, rhonchi, wheezes, rales.  Abdominal: Soft. BS +,  no distension, tenderness, rebound or guarding.  Musculoskeletal: Normal range of motion. Tenderness in lumbosacral area noted  Skin: Skin is warm and dry. No rash noted. Not diaphoretic. No erythema. No pallor.   Data Reviewed: I have personally reviewed following labs and imaging studies  CBC: Recent Labs  Lab 04/09/17 1610 04/11/17 0541 04/12/17 0607 04/14/17 0620  WBC 4.8 6.1 5.6 4.9  NEUTROABS  --   --   --  1.5*  HGB 10.6* 10.0* 12.4 10.6*  HCT 33.0* 30.8* 37.1 31.9*  MCV 90.4 89.3 87.7 88.6  PLT 237 242 242 203   Basic Metabolic Panel: Recent Labs  Lab 04/09/17 1610 04/11/17 0541 04/12/17 0607 04/12/17 2234 04/14/17 0620 04/15/17 0537  NA 137 136 132*  --  138 136  K 4.0 3.5 3.6  --  3.9 4.0  CL 104 104 99*  --  108 106  CO2 28 26 26   --  25 25  GLUCOSE 126* 108* 227* 100* 116* 88  BUN 9 29* 13  --  10 11  CREATININE 0.36* 0.52 0.32*  --  0.32* 0.41*  CALCIUM 8.8* 8.8* 8.6*  --  8.4* 8.7*    Recent Labs  Lab 04/09/17 1610  AST 27  ALT 21  ALKPHOS 79  BILITOT 0.5    PROT 8.1  ALBUMIN 3.1*   Recent Labs  Lab 04/09/17 1610  LIPASE 14   CBG: Recent Labs  Lab 04/14/17 1253 04/14/17 1717 04/14/17 2045 04/15/17 0727 04/15/17 1200  GLUCAP 156* 160* 231* 81 175*    Radiology Studies: I have reviewed all imaging studies as well as blood work during this hospital stay.  Scheduled Meds: . cholestyramine light  4 g Oral 3 times per day  . citalopram  20 mg Oral Daily  . diphenoxylate-atropine  2 tablet Oral 4 times per day  . ferrous sulfate  325 mg Oral BID WC  . gabapentin  300 mg Oral BID  . gi cocktail  30 mL Oral Once  . insulin aspart  0-9 Units Subcutaneous TID WC  . insulin glargine  5 Units Subcutaneous Daily  . lipase/protease/amylase  36,000 Units Oral TID WC  . loperamide  2 mg Oral TID AC  . metoCLOPramide (REGLAN) injection  5 mg Intravenous Q6H  . pantoprazole  40 mg Oral BID   Continuous Infusions:   LOS: 4 days   Debbora Presto, MD [facility] Pager [phone]  Total 25 minutes spent

## 2017-04-16 LAB — GLUCOSE, CAPILLARY
Glucose-Capillary: 81 mg/dL (ref 65–99)
Glucose-Capillary: 238 mg/dL — ABNORMAL HIGH (ref 65–99)

## 2017-04-16 MED ORDER — OXYCODONE HCL 5 MG PO TABS: 5.0000 mg | ORAL_TABLET | ORAL | 0 refills | Status: DC | PRN

## 2017-04-16 MED ORDER — HYDROMORPHONE HCL 1 MG/ML IJ SOLN
1.0000 mg | Freq: Once | INTRAMUSCULAR | Status: AC
  Administered 2017-04-16: 1 mg via INTRAVENOUS
  Filled 2017-04-16: qty 1

## 2017-04-16 MED ORDER — OXYCODONE HCL 5 MG PO TABS: 5.0000 mg | ORAL_TABLET | ORAL | Status: DC | PRN

## 2017-04-16 MED ORDER — PANTOPRAZOLE SODIUM 40 MG PO TBEC: 40.0000 mg | DELAYED_RELEASE_TABLET | Freq: Two times a day (BID) | ORAL | 0 refills | Status: DC

## 2017-04-16 MED ORDER — PROMETHAZINE HCL 12.5 MG PO TABS: 12.5000 mg | ORAL_TABLET | Freq: Four times a day (QID) | ORAL | 0 refills | Status: DC | PRN

## 2017-04-16 NOTE — Discharge Summary (Signed)
Physician Discharge Summary  [name] CMK:[id number] DOB: January 05, 1991 DOA: 04/09/2017  PCP: Girtha Rm, NP-C  Admit date: 04/09/2017 Discharge date: 04/16/2017  Recommendations for Outpatient Follow-up:  1. Pt will need to follow up with PCP in 2-3 weeks post discharge 2. Please obtain BMP to evaluate electrolytes and kidney function 3. Please also check CBC to evaluate Hg and Hct levels  Discharge Diagnoses:  Principal Problem:   Abdominal pain Active Problems:   Diabetes type 1, uncontrolled (HCC)   Hypertension associated with diabetes (HCC)   Gastroparesis   Diarrhea   Cellulitis and abscess of buttock   Nausea and vomiting   Intractable nausea and vomiting  Discharge Condition: Stable  Diet recommendation: Heart healthy diet discussed in details   History of present illness:   Brief Narrative:  Patient is 27 year old female with diabetes type 1, gastroparesis, recent perirectal abscess. Patient presented with nausea and vomiting, abdominal pain that was thought to be due to gastroparesis flare. CT of the abdomen notable for diffuse mesenteric and subcutaneous soft tissue edema, diffusely thickened colon with no bowel obstruction. Drainage tube noted in intergluteal cleft and soft tissues superficial. Loculated fluid measures 6 cm in length and 1.5 cm thick.  Assessment & Plan:   Principal Problem:   Abdominal pain with nausea and vomiting - Secondary to gastroparesis flare - Patient reports feeling better overall still with intermittent episodes of nausea but has not been vomiting - So far tolerating diet well - OK to be discharged  - electrolytes stable   Active Problems:   Diabetes type 1, uncontrolled (HCC) - resume home medical regimen     Hypertension associated with diabetes ([place]) - reasonable inpatient control     Back pain - XRAY of the lumbar spine unremarkable     Diarrhea, Crohn's disease - pt says no diarrhea overnight    Cellulitis and abscess of buttock - Clinically improving, drain in place, imaging studies reviewed by IR, no need for extra drains  DVT prophylaxis:enoxaparin Code Status:full Family Communication:no family at the bedside Disposition Plan:home   Consultants:  None  Procedures:  None  Procedures/Studies: Dg Chest 2 View  Result Date: 04/06/2017 CLINICAL DATA:  Chest discomfort, weakness, shortness of breath and low back pain. EXAM: CHEST  2 VIEW COMPARISON:  03/17/2017. FINDINGS: Trachea is midline. Heart size normal. Lungs are mildly hyperinflated but clear. No pleural fluid. IMPRESSION: No acute findings. Electronically Signed   By: Lorin Picket M.D.   On: 04/06/2017 12:39   Dg Lumbar Spine Complete  Result Date: 04/15/2017 CLINICAL DATA:  One week of back pain possibly related to a sacral abscess which was recently drained. EXAM: LUMBAR SPINE - COMPLETE 4+ VIEW COMPARISON:  Abdominal and pelvic CT scan of April 10, 2017 FINDINGS: The lumbar vertebral bodies are preserved in height. The disc space heights are well maintained. There is no spondylolisthesis. The pedicles and transverse processes are intact where visualized. IMPRESSION: There is no acute or significant chronic bony abnormality of the lumbar spine. Electronically Signed   By: David  Martinique M.D.   On: 04/15/2017 13:15   Ct Pelvis W Contrast  Result Date: 03/23/2017 CLINICAL DATA:  Assess perirectal abscess. Status post debridement of abscess several weeks ago. Increasing drainage and pain. Fever. EXAM: CT PELVIS WITH CONTRAST TECHNIQUE: Multidetector CT imaging of the pelvis was performed using the standard protocol following the bolus administration of intravenous contrast. CONTRAST:  113m ISOVUE-300 IOPAMIDOL (ISOVUE-300) INJECTION 61% COMPARISON:  CT of the  abdomen and pelvis performed 03/15/2017 FINDINGS: Urinary Tract: The bladder is mildly distended and grossly unremarkable. Bowel: Visualized small and  large bowel loops are grossly unremarkable. Vascular/Lymphatic: No acute vascular abnormalities are seen. No retroperitoneal or pelvic sidewall lymphadenopathy is appreciated. Reproductive: The uterus is grossly unremarkable. No suspicious adnexal masses are seen. The ovaries are relatively symmetric. Other: The previously noted abscess overlying the sacrum and coccyx has largely resolved. However, there is increasing thin multiloculated fluid and air tracking along the soft tissues superficial to the right gluteus musculature, measuring up to 9 cm, reflecting abscess. Diffuse soft tissue edema is seen about the pelvis. Musculoskeletal: No acute osseous abnormalities are identified. The visualized musculature is unremarkable in appearance. IMPRESSION: 1. Previously noted abscess overlying the sacrum and coccyx is largely resolved. However, there is increasing thin multiloculated abscess tracking along the soft tissues superficial to the right gluteus musculature, measuring up to 9 cm in length. 2. Diffuse soft tissue edema about the pelvis. Electronically Signed   By: Garald Balding M.D.   On: 03/23/2017 05:56   Ct Abdomen Pelvis W Contrast  Result Date: 04/10/2017 CLINICAL DATA:  27 year old female with abdominal pain. Nausea vomiting and diarrhea. EXAM: CT ABDOMEN AND PELVIS WITH CONTRAST TECHNIQUE: Multidetector CT imaging of the abdomen and pelvis was performed using the standard protocol following bolus administration of intravenous contrast. CONTRAST:  100 cc Isovue-300 COMPARISON:  Pelvic CT dated 03/23/2017 FINDINGS: Lower chest: The visualized lung bases are clear. Top-normal cardiac size. The heart is only partially visualized. No intra-free air. There is diffuse mesenteric edema and stranding and trace free fluid within the pelvis. Hepatobiliary: Cholecystectomy. The liver is unremarkable. No intrahepatic biliary ductal dilatation. Pancreas: Unremarkable. No pancreatic ductal dilatation or surrounding  inflammatory changes. Spleen: Normal in size without focal abnormality. Adrenals/Urinary Tract: The adrenal glands are unremarkable. There is no hydronephrosis on either side. There is symmetric enhancement of the kidneys. The urinary bladder is distended and grossly unremarkable. Stomach/Bowel: Evaluation of the bowel is limited in the absence of oral contrast. There is diffuse thickened appearance of the colon which may be reactive and secondary to anasarca or represent colitis. Loose stool noted in the colon compatible with diarrheal state. Correlation with clinical exam and stool cultures recommended. There is no bowel obstruction. Small tubular appearing structure medial to the cecum in the right lower quadrant may represent a normal appendix Vascular/Lymphatic: Abdominal aorta and IVC appear unremarkable. The origins of the celiac axis, SMA, and IMA are patent. No portal venous gas. There is no adenopathy. Top-normal bilateral inguinal lymph nodes noted, likely reactive. Reproductive: The uterus is grossly unremarkable. The ovaries are suboptimally visualized Other: There is diffuse subcutaneous edema and anasarca. Patient is status post recent I&D of abscess over the sacrum. A drainage tube is seen in the intergluteal cleft and soft tissues superficial to the sacrum. There is small amount of loculated fluid in the subcutaneous soft tissues superficial to the right gluteal region measuring approximately 6 cm in length and 1.5 cm in thickness. This fluid may be partially organized. Musculoskeletal: No acute or significant osseous findings. IMPRESSION: 1. Status post recent incision and drainage of abscess superficial to the sacral region and drainage catheter placement. Small amount of loculated fluid is seen in the subcutaneous soft tissues superficial to the right gluteus muscle. 2. Diffuse mesenteric and subcutaneous soft tissue edema and anasarca. 3. Diarrheal state with diffusely thickened colon.  Correlation with clinical exam and stool cultures recommended to evaluate for colitis. No  bowel obstruction. Electronically Signed   By: Anner Crete M.D.   On: 04/10/2017 02:36   US Abdomen Limited  Result Date: 03/18/2017 CLINICAL DATA:  27 year old female with anasarca and possible ascites. EXAM: LIMITED ABDOMEN ULTRASOUND FOR ASCITES TECHNIQUE: Limited ultrasound survey for ascites was performed in all four abdominal quadrants. COMPARISON:  CT scan of the abdomen and pelvis 03/05/2017 FINDINGS: Sonographic interrogation of the 4 quadrants of the abdomen demonstrates trace ascites. There is insufficient fluid for paracentesis. IMPRESSION: 1. Trace ascites. 2. Paracentesis was deferred. Electronically Signed   By: Jacqulynn Cadet M.D.   On: 03/18/2017 11:29   Discharge Exam: Vitals:   04/16/17 0532 04/16/17 0800  BP: (!) 101/55 125/85  Pulse: 93 95  Resp: 16   Temp: 98.3 F (36.8 C) 98.1 F (36.7 C)  SpO2: 98% 100%   Vitals:   04/15/17 1421 04/15/17 2039 04/16/17 0532 04/16/17 0800  BP: 138/89 (!) 141/89 (!) 101/55 125/85  Pulse: 93 94 93 95  Resp: 17 17 16    Temp: 98.2 F (36.8 C)  98.3 F (36.8 C) 98.1 F (36.7 C)  TempSrc: Oral Oral Oral Oral  SpO2: 98% 98% 98% 100%  Weight:      Height:        General: Pt is alert, follows commands appropriately, not in acute distress Cardiovascular: Regular rate and rhythm, S1/S2 +, no murmurs, no rubs, no gallops Respiratory: Clear to auscultation bilaterally, no wheezing, no crackles, no rhonchi Abdominal: Soft, non tender, non distended, bowel sounds +, no guarding Extremities: no edema, no cyanosis, pulses palpable bilaterally DP and PT Neuro: Grossly nonfocal  Discharge Instructions  Discharge Instructions    Diet - low sodium heart healthy   Complete by:  As directed    Increase activity slowly   Complete by:  As directed      Allergies as of 04/16/2017      Reactions   Peanut-containing Drug Products Anaphylaxis,  Other (See Comments)   Pt states that she is allergic to [place] nuts; can tolerate peanuts   Lactose Intolerance (gi) Other (See Comments)   Gi upset       Medication List    STOP taking these medications   MIDOL 200 MG Caps Generic drug:  Ibuprofen     TAKE these medications   albuterol 108 (90 Base) MCG/ACT inhaler Commonly known as:  PROVENTIL HFA;VENTOLIN HFA Inhale 1-2 puffs into the lungs every 4 (four) hours as needed for wheezing or shortness of breath.   cholestyramine light 4 g packet Commonly known as:  PREVALITE Take 1 packet (4 g total) by mouth 3 (three) times daily.   citalopram 20 MG tablet Commonly known as:  CELEXA Take 1 tablet (20 mg total) by mouth daily.   diphenoxylate-atropine 2.5-0.025 MG tablet Commonly known as:  LOMOTIL Take 2 tablets by mouth 4 (four) times daily.   EPINEPHrine 0.3 mg/0.3 mL Soaj injection Commonly known as:  EPIPEN 2-PAK Inject 0.3 mLs (0.3 mg total) into the muscle as needed (allergic reaction).   feeding supplement (GLUCERNA SHAKE) Liqd Take 237 mLs by mouth 2 (two) times daily between meals.   ferrous sulfate 325 (65 FE) MG tablet Take 1 tablet (325 mg total) by mouth 2 (two) times daily with a meal.   folic acid 1 MG tablet Commonly known as:  FOLVITE Take 1 tablet (1 mg total) by mouth daily.   furosemide 40 MG tablet Commonly known as:  LASIX Take 1 tablet (40 mg total)  by mouth daily.   gabapentin 300 MG capsule Commonly known as:  NEURONTIN Take 1 capsule (300 mg total) by mouth 2 (two) times daily.   glucose 4 GM chewable tablet Chew 1 tablet (4 g total) by mouth as needed for low blood sugar.   insulin lispro 100 UNIT/ML injection Commonly known as:  HUMALOG Inject 0.02 mLs (2 Units total) into the skin 3 (three) times daily with meals.   LANTUS 100 UNIT/ML injection Generic drug:  insulin glargine Inject 0.2 mLs (20 Units total) into the skin every morning.   lipase/protease/amylase 36000 UNITS  Cpep capsule Commonly known as:  CREON Take 1 capsule (36,000 Units total) by mouth 3 (three) times daily with meals.   loperamide 2 MG capsule Commonly known as:  IMODIUM Take 1 capsule (2 mg total) by mouth 3 (three) times daily before meals. DO NOT TAKE IT FOR THE DAY YOU DONT HAVE A BOWEL MOVEMENT.   methocarbamol 500 MG tablet Commonly known as:  ROBAXIN Take 1 tablet (500 mg total) by mouth every 8 (eight) hours as needed for muscle spasms.   metoCLOPramide 5 MG tablet Commonly known as:  REGLAN Take 5 mg by mouth 4 (four) times daily -  before meals and at bedtime.   oxyCODONE 5 MG immediate release tablet Commonly known as:  Oxy IR/ROXICODONE Take 1 tablet (5 mg total) by mouth every 4 (four) hours as needed for moderate pain or severe pain.   pantoprazole 40 MG tablet Commonly known as:  PROTONIX Take 1 tablet (40 mg total) by mouth 2 (two) times daily.   promethazine 12.5 MG tablet Commonly known as:  PHENERGAN Take 1 tablet (12.5 mg total) by mouth every 6 (six) hours as needed for nausea or vomiting.       Follow-up Information    Henson, Vickie L, NP-C Follow up.   Specialty:  Family Medicine Contact information: [place] [address] [phone]        Theodis Blaze, MD Follow up.   Specialty:  Internal Medicine Contact information: [address] [place] [address] [phone]            The results of significant diagnostics from this hospitalization (including imaging, microbiology, ancillary and laboratory) are listed below for reference.     Microbiology: No results found for this or any previous visit (from the past 240 hour(s)).   Labs: Basic Metabolic Panel: Recent Labs  Lab 04/09/17 1610 04/11/17 0541 04/12/17 0607 04/12/17 2234 04/14/17 0620 04/15/17 0537  NA 137 136 132*  --  138 136  K 4.0 3.5 3.6  --  3.9 4.0  CL 104 104 99*  --  108 106  CO2 28 26 26   --  25 25  GLUCOSE  126* 108* 227* 100* 116* 88  BUN 9 29* 13  --  10 11  CREATININE 0.36* 0.52 0.32*  --  0.32* 0.41*  CALCIUM 8.8* 8.8* 8.6*  --  8.4* 8.7*   Liver Function Tests: Recent Labs  Lab 04/09/17 1610  AST 27  ALT 21  ALKPHOS 79  BILITOT 0.5  PROT 8.1  ALBUMIN 3.1*   Recent Labs  Lab 04/09/17 1610  LIPASE 14   CBC: Recent Labs  Lab 04/09/17 1610 04/11/17 0541 04/12/17 0607 04/14/17 0620  WBC 4.8 6.1 5.6 4.9  NEUTROABS  --   --   --  1.5*  HGB 10.6* 10.0* 12.4 10.6*  HCT 33.0* 30.8* 37.1 31.9*  MCV 90.4 89.3 87.7 88.6  PLT 237 242 242 203   CBG: Recent Labs  Lab 04/15/17 0727 04/15/17 1200 04/15/17 1806 04/15/17 2039 04/16/17 0736  GLUCAP 81 175* 147* 127* 81   SIGNED: Time coordinating discharge: 45 minutes  Faye Ramsay, MD  [facility] 04/16/2017, 9:25 AM Pager [phone]  If 7PM-7AM, please contact night-coverage [email or web address] Password [email or web address]

## 2017-04-16 NOTE — Discharge Instructions (Signed)
Back Exercises If you have pain in your back, do these exercises 2-3 times each day or as told by your doctor. When the pain goes away, do the exercises once each day, but repeat the steps more times for each exercise (do more repetitions). If you do not have pain in your back, do these exercises once each day or as told by your doctor. Exercises Single Knee to Chest  Do these steps 3-5 times in a row for each leg: 1. Lie on your back on a firm bed or the floor with your legs stretched out. 2. Bring one knee to your chest. 3. Hold your knee to your chest by grabbing your knee or thigh. 4. Pull on your knee until you feel a gentle stretch in your lower back. 5. Keep doing the stretch for 10-30 seconds. 6. Slowly let go of your leg and straighten it.  Pelvic Tilt  Do these steps 5-10 times in a row: 1. Lie on your back on a firm bed or the floor with your legs stretched out. 2. Bend your knees so they point up to the ceiling. Your feet should be flat on the floor. 3. Tighten your lower belly (abdomen) muscles to press your lower back against the floor. This will make your tailbone point up to the ceiling instead of pointing down to your feet or the floor. 4. Stay in this position for 5-10 seconds while you gently tighten your muscles and breathe evenly.  Cat-Cow  Do these steps until your lower back bends more easily: 1. Get on your hands and knees on a firm surface. Keep your hands under your shoulders, and keep your knees under your hips. You may put padding under your knees. 2. Let your head hang down, and make your tailbone point down to the floor so your lower back is round like the back of a cat. 3. Stay in this position for 5 seconds. 4. Slowly lift your head and make your tailbone point up to the ceiling so your back hangs low (sags) like the back of a cow. 5. Stay in this position for 5 seconds.  Press-Ups  Do these steps 5-10 times in a row: 1. Lie on your belly (face-down)  on the floor. 2. Place your hands near your head, about shoulder-width apart. 3. While you keep your back relaxed and keep your hips on the floor, slowly straighten your arms to raise the top half of your body and lift your shoulders. Do not use your back muscles. To make yourself more comfortable, you may change where you place your hands. 4. Stay in this position for 5 seconds. 5. Slowly return to lying flat on the floor.  Bridges  Do these steps 10 times in a row: 1. Lie on your back on a firm surface. 2. Bend your knees so they point up to the ceiling. Your feet should be flat on the floor. 3. Tighten your butt muscles and lift your butt off of the floor until your waist is almost as high as your knees. If you do not feel the muscles working in your butt and the back of your thighs, slide your feet 1-2 inches farther away from your butt. 4. Stay in this position for 3-5 seconds. 5. Slowly lower your butt to the floor, and let your butt muscles relax.  If this exercise is too easy, try doing it with your arms crossed over your chest. Belly Crunches  Do these steps 5-10 times in   a row: 1. Lie on your back on a firm bed or the floor with your legs stretched out. 2. Bend your knees so they point up to the ceiling. Your feet should be flat on the floor. 3. Cross your arms over your chest. 4. Tip your chin a little bit toward your chest but do not bend your neck. 5. Tighten your belly muscles and slowly raise your chest just enough to lift your shoulder blades a tiny bit off of the floor. 6. Slowly lower your chest and your head to the floor.  Back Lifts Do these steps 5-10 times in a row: 1. Lie on your belly (face-down) with your arms at your sides, and rest your forehead on the floor. 2. Tighten the muscles in your legs and your butt. 3. Slowly lift your chest off of the floor while you keep your hips on the floor. Keep the back of your head in line with the curve in your back. Look at  the floor while you do this. 4. Stay in this position for 3-5 seconds. 5. Slowly lower your chest and your face to the floor.  Contact a doctor if:  Your back pain gets a lot worse when you do an exercise.  Your back pain does not lessen 2 hours after you exercise. If you have any of these problems, stop doing the exercises. Do not do them again unless your doctor says it is okay. Get help right away if:  You have sudden, very bad back pain. If this happens, stop doing the exercises. Do not do them again unless your doctor says it is okay. This information is not intended to replace advice given to you by your health care provider. Make sure you discuss any questions you have with your health care provider. Document Released: 04/19/2010 Document Revised: 08/23/2015 Document Reviewed: 05/11/2014 Elsevier Interactive Patient Education  2018 [employer]  

## 2017-04-21 ENCOUNTER — Ambulatory Visit: Payer: Self-pay | Admitting: Nutrition

## 2017-04-21 DIAGNOSIS — E109 Type 1 diabetes mellitus without complications: Secondary | ICD-10-CM | POA: Diagnosis not present

## 2017-04-21 DIAGNOSIS — R109 Unspecified abdominal pain: Secondary | ICD-10-CM | POA: Diagnosis not present

## 2017-04-21 DIAGNOSIS — L0231 Cutaneous abscess of buttock: Secondary | ICD-10-CM | POA: Diagnosis not present

## 2017-04-21 DIAGNOSIS — R5383 Other fatigue: Secondary | ICD-10-CM | POA: Diagnosis not present

## 2017-04-21 DIAGNOSIS — L03317 Cellulitis of buttock: Secondary | ICD-10-CM | POA: Diagnosis not present

## 2017-04-21 DIAGNOSIS — B9689 Other specified bacterial agents as the cause of diseases classified elsewhere: Secondary | ICD-10-CM | POA: Diagnosis not present

## 2017-04-22 DIAGNOSIS — E1043 Type 1 diabetes mellitus with diabetic autonomic (poly)neuropathy: Secondary | ICD-10-CM | POA: Diagnosis not present

## 2017-04-22 DIAGNOSIS — G4700 Insomnia, unspecified: Secondary | ICD-10-CM | POA: Diagnosis not present

## 2017-04-22 DIAGNOSIS — K5090 Crohn's disease, unspecified, without complications: Secondary | ICD-10-CM | POA: Diagnosis not present

## 2017-04-22 DIAGNOSIS — K3184 Gastroparesis: Secondary | ICD-10-CM | POA: Diagnosis not present

## 2017-04-22 DIAGNOSIS — L0231 Cutaneous abscess of buttock: Secondary | ICD-10-CM | POA: Diagnosis not present

## 2017-04-23 DIAGNOSIS — E1043 Type 1 diabetes mellitus with diabetic autonomic (poly)neuropathy: Secondary | ICD-10-CM | POA: Diagnosis not present

## 2017-04-23 DIAGNOSIS — K3184 Gastroparesis: Secondary | ICD-10-CM | POA: Diagnosis not present

## 2017-04-27 DIAGNOSIS — M545 Low back pain: Secondary | ICD-10-CM | POA: Diagnosis not present

## 2017-04-27 DIAGNOSIS — K611 Rectal abscess: Secondary | ICD-10-CM | POA: Diagnosis not present

## 2017-04-27 DIAGNOSIS — E119 Type 2 diabetes mellitus without complications: Secondary | ICD-10-CM | POA: Diagnosis not present

## 2017-04-29 ENCOUNTER — Ambulatory Visit: Payer: 59 | Admitting: Family Medicine

## 2017-04-29 ENCOUNTER — Ambulatory Visit: Payer: Self-pay | Admitting: Family Medicine

## 2017-04-29 NOTE — Progress Notes (Deleted)
   Admit date: 04/09/2017 Discharge date: 04/16/2017  Recommendations for Outpatient Follow-up:  1. Pt will need to follow up with PCP in 2-3 weeks post discharge 2. Please obtain BMP to evaluate electrolytes and kidney function 3. Please also check CBC to evaluate Hg and Hct levels  Discharge Diagnoses:  Principal Problem:   Abdominal pain Active Problems:   Diabetes type 1, uncontrolled (HCC)   Hypertension associated with diabetes (HCC)   Gastroparesis   Diarrhea   Cellulitis and abscess of buttock   Nausea and vomiting   Intractable nausea and vomiting

## 2017-05-01 ENCOUNTER — Ambulatory Visit: Payer: 59 | Admitting: Family Medicine

## 2017-05-02 ENCOUNTER — Emergency Department (HOSPITAL_COMMUNITY)
Admission: EM | Admit: 2017-05-02 | Discharge: 2017-05-02 | Disposition: A | Discharge status: Home / Self Care | Payer: 59 | Attending: Emergency Medicine | Admitting: Emergency Medicine

## 2017-05-02 ENCOUNTER — Encounter (HOSPITAL_COMMUNITY): Payer: Self-pay | Admitting: Emergency Medicine

## 2017-05-02 DIAGNOSIS — Z91010 Allergy to peanuts: Secondary | ICD-10-CM | POA: Diagnosis not present

## 2017-05-02 DIAGNOSIS — I10 Essential (primary) hypertension: Secondary | ICD-10-CM | POA: Insufficient documentation

## 2017-05-02 DIAGNOSIS — M545 Low back pain, unspecified: Secondary | ICD-10-CM

## 2017-05-02 DIAGNOSIS — E109 Type 1 diabetes mellitus without complications: Secondary | ICD-10-CM | POA: Diagnosis not present

## 2017-05-02 DIAGNOSIS — R197 Diarrhea, unspecified: Secondary | ICD-10-CM | POA: Diagnosis not present

## 2017-05-02 DIAGNOSIS — Z794 Long term (current) use of insulin: Secondary | ICD-10-CM | POA: Diagnosis not present

## 2017-05-02 DIAGNOSIS — Z79899 Other long term (current) drug therapy: Secondary | ICD-10-CM | POA: Diagnosis not present

## 2017-05-02 DIAGNOSIS — R110 Nausea: Secondary | ICD-10-CM | POA: Diagnosis not present

## 2017-05-02 LAB — URINALYSIS, ROUTINE W REFLEX MICROSCOPIC
Bacteria, UA: NONE SEEN
Bilirubin Urine: NEGATIVE
Glucose, UA: 50 mg/dL — AB
Ketones, ur: NEGATIVE mg/dL
Nitrite: NEGATIVE
Protein, ur: NEGATIVE mg/dL
Specific Gravity, Urine: 1.018 (ref 1.005–1.030)
pH: 5 (ref 5.0–8.0)

## 2017-05-02 LAB — CBC
HCT: 40.5 % (ref 36.0–46.0)
Hemoglobin: 13.8 g/dL (ref 12.0–15.0)
MCH: 29.2 pg (ref 26.0–34.0)
MCHC: 34.1 g/dL (ref 30.0–36.0)
MCV: 85.8 fL (ref 78.0–100.0)
Platelets: 222 10*3/uL (ref 150–400)
RBC: 4.72 MIL/uL (ref 3.87–5.11)
RDW: 11.8 % (ref 11.5–15.5)
WBC: 4.2 10*3/uL (ref 4.0–10.5)

## 2017-05-02 LAB — COMPREHENSIVE METABOLIC PANEL
ALT: 30 U/L (ref 14–54)
AST: 30 U/L (ref 15–41)
Albumin: 3.9 g/dL (ref 3.5–5.0)
Alkaline Phosphatase: 94 U/L (ref 38–126)
Anion gap: 8 (ref 5–15)
BUN: 19 mg/dL (ref 6–20)
CO2: 29 mmol/L (ref 22–32)
Calcium: 9.6 mg/dL (ref 8.9–10.3)
Chloride: 100 mmol/L — ABNORMAL LOW (ref 101–111)
Creatinine, Ser: 0.49 mg/dL (ref 0.44–1.00)
GFR calc Af Amer: >60 mL/min (ref >60)
GFR calc non Af Amer: >60 mL/min (ref >60)
Glucose, Bld: 180 mg/dL — ABNORMAL HIGH (ref 65–99)
Potassium: 4.6 mmol/L (ref 3.5–5.1)
Sodium: 137 mmol/L (ref 135–145)
Total Bilirubin: 0.1 mg/dL — ABNORMAL LOW (ref 0.3–1.2)
Total Protein: 8.9 g/dL — ABNORMAL HIGH (ref 6.5–8.1)

## 2017-05-02 LAB — LIPASE, BLOOD: Lipase: 23 U/L (ref 11–51)

## 2017-05-02 LAB — I-STAT BETA HCG BLOOD, ED (MC, WL, AP ONLY): I-stat hCG, quantitative: <5 m[IU]/mL (ref <5)

## 2017-05-02 MED ORDER — CYCLOBENZAPRINE HCL 10 MG PO TABS: 10.0000 mg | ORAL_TABLET | Freq: Two times a day (BID) | ORAL | 0 refills | Status: DC | PRN

## 2017-05-02 MED ORDER — HYDROMORPHONE HCL 1 MG/ML IJ SOLN
1.0000 mg | Freq: Once | INTRAMUSCULAR | Status: AC
  Administered 2017-05-02: 1 mg via INTRAVENOUS
  Filled 2017-05-02: qty 1

## 2017-05-02 MED ORDER — SODIUM CHLORIDE 0.9 % IV BOLUS (SEPSIS)
1000.0000 mL | Freq: Once | INTRAVENOUS | Status: AC
  Administered 2017-05-02: 1000 mL via INTRAVENOUS

## 2017-05-02 MED ORDER — ONDANSETRON HCL 4 MG/2ML IJ SOLN
4.0000 mg | Freq: Once | INTRAMUSCULAR | Status: AC
  Administered 2017-05-02: 4 mg via INTRAVENOUS
  Filled 2017-05-02: qty 2

## 2017-05-02 NOTE — Discharge Instructions (Addendum)
You may use over-the-counter Motrin (Ibuprofen), Acetaminophen (Tylenol), topical muscle creams such as SalonPas, [employer], Bengay, etc. Please stretch, apply heat, and have massage therapy for additional assistance.  Your blood work and urine was reassuring today.  Return to the emergency department if you have vomiting that will not stop, fever greater than 100.4 F, worsening pain and redness on your bottom or have any new or worsening symptoms.

## 2017-05-02 NOTE — ED Provider Notes (Signed)
[facility] DEPT Provider Note   CSN: [id number] Arrival date & time: 05/02/17  0805     History   Chief Complaint Chief Complaint  Patient presents with  . Back Pain  . Post-op Problem    HPI [name] is a 27 y.o. female.  HPI   [name] is a 27 year old female with a history of type 1 diabetes, Crohn's disease, gastroparesis, hypertension, fibromyalgia who presents to the emergency department for ongoing back pain.  Patient states that she has two Primrose drains in her right buttocks from perirectal abscesses which have been drained over the past month.  She had one abscess drained 03/23/2017 and the other 04/22/2017.  Reports that for the past 3 weeks now she has had constant 9/10 severity "tight"bilateral lumbar back pain.  Pain radiates to the thoracic spine at times. Pain is worsened with twisting, bending or ambulating. She has taken Norco, Flexeril and ibuprofen for her pain which she states helps some, but does not totally resolve her pain. States she has moderate aching epigastric pain as well. Reports the epigastric pain has been ongoing for the past three weeks with her back pain. Reports associated nausea, no vomiting. She chronically has diarrhea, this is unchanged recently. Denies fever, chills, night sweats, unexpected weight changes, increased buttocks pain/redness, chest pain, SOB, loss of bowel or bladder control, saddle anesthesia, numbness, weakness.  She is able to ambulate independently despite pain.  Per chart review patient was seen in [facility] ED for lumbar back pain 04/27/17 for which she had negative lumbar plain film and pain was presumed to be musculoskeletal in the setting of poor posture given perirectal drains. .  Past Medical History:  Diagnosis Date  . Anxiety   . Crohn's disease ([place])   . Diabetes type 1, uncontrolled ([place]) 11/14/2011   Since age 30  . Fibromyalgia   . Gastroparesis   . Hypertension   . Infection      UTI April 2016    Patient Active Problem List   Diagnosis Date Noted  . Intractable nausea and vomiting 04/11/2017  . Nausea and vomiting 04/10/2017  . Essential hypertension   . Colitis   . Crohn's disease ([place]) 04/03/2017  . Abscess and cellulitis of gluteal region 03/24/2017  . Cellulitis and abscess of buttock 03/23/2017  . Malnutrition of moderate degree 03/19/2017  . Anasarca 03/16/2017  . MDD (major depressive disorder), recurrent episode, mild ([place]) 02/28/2017  . Diarrhea 02/27/2017  . Lower GI bleed 02/27/2017  . DKA (diabetic ketoacidosis) ([place]) 02/26/2017  . Acute metabolic encephalopathy   . DKA, type 1 ([place]) 06/11/2013  . Orthostatic hypotension dysautonomic syndrome ([place]) 04/28/2013  . Body aches 04/28/2013  . Orthostatic hypotension 04/28/2013  . Diabetes mellitus type 1 ([place]) 04/28/2013  . Protein-calorie malnutrition, severe ([place]) 04/28/2013  . Noncompliance with diabetes treatment 09/01/2012  . Dyspnea 02/28/2012  . Sinus tachycardia 02/28/2012  . Hyperglycemia without ketosis 02/28/2012  . Chest pain 12/04/2011  . Diabetes type 1, uncontrolled ([place]) 11/14/2011  . Hypertension associated with diabetes ([place]) 11/14/2011  . Gastroparesis 11/14/2011  . Hyponatremia 09/19/2011  . Abdominal pain 09/18/2011  . Metabolic alkalosis 40/98/1191  . Diabetic hyperosmolar non-ketotic state ([place]) 09/18/2011  . DIAB W/UNSPEC COMP TYPE I [JUV TYPE] UNCNTRL 06/04/2010  . CONSTIPATION 01/01/2010  . RECTAL PAIN 01/01/2010    Past Surgical History:  Procedure Laterality Date  . ANKLE SURGERY    . CHOLECYSTECTOMY  11/15/2011   Procedure: LAPAROSCOPIC CHOLECYSTECTOMY WITH  INTRAOPERATIVE CHOLANGIOGRAM;  Surgeon: [name], MD;  Location: [facility];  Service: General;  Laterality: N/A;  . ESOPHAGOGASTRODUODENOSCOPY  12/03/2011   Procedure: ESOPHAGOGASTRODUODENOSCOPY (EGD);  Surgeon: Beryle Beams, MD;  Location: [facility];  Service: Endoscopy;  Laterality: N/A;  .  FLEXIBLE SIGMOIDOSCOPY N/A 03/10/2017   Procedure: FLEXIBLE SIGMOIDOSCOPY;  Surgeon: Carol Ada, MD;  Location: [facility];  Service: Endoscopy;  Laterality: N/A;  . INCISION AND DRAINAGE PERIRECTAL ABSCESS N/A 03/01/2017   Procedure: IRRIGATION AND DEBRIDEMENT PERIRECTAL ABSCESS;  Surgeon: Alphonsa Overall, MD;  Location: [facility];  Service: General;  Laterality: N/A;  . IRRIGATION AND DEBRIDEMENT BUTTOCKS N/A 03/23/2017   Procedure: IRRIGATION AND DEBRIDEMENT BUTTOCKS, SETON PLACEMENT;  Surgeon: [name], MD;  Location: [facility];  Service: General;  Laterality: N/A;  . LAPAROSCOPY  11/23/2011   Procedure: LAPAROSCOPY DIAGNOSTIC;  Surgeon: Edward Jolly, MD;  Location: [facility];  Service: General;  Laterality: N/A;    OB History    Gravida Para Term Preterm AB Living   1 1   1   1    SAB TAB Ectopic Multiple Live Births         0 1       Home Medications    Prior to Admission medications   Medication Sig Start Date End Date Taking? Authorizing Provider  albuterol (PROVENTIL HFA;VENTOLIN HFA) 108 (90 Base) MCG/ACT inhaler Inhale 1-2 puffs into the lungs every 4 (four) hours as needed for wheezing or shortness of breath. 09/02/16   Street, [place], PA-C  cholestyramine light (PREVALITE) 4 g packet Take 1 packet (4 g total) by mouth 3 (three) times daily. 03/14/17   Lavina Hamman, MD  citalopram (CELEXA) 20 MG tablet Take 1 tablet (20 mg total) by mouth daily. 03/15/17   Lavina Hamman, MD  diphenoxylate-atropine (LOMOTIL) 2.5-0.025 MG tablet Take 2 tablets by mouth 4 (four) times daily. 04/08/17   Henson, Vickie L, NP-C  EPINEPHrine (EPIPEN 2-PAK) 0.3 mg/0.3 mL IJ SOAJ injection Inject 0.3 mLs (0.3 mg total) into the muscle as needed (allergic reaction). 03/21/17   Aline August, MD  feeding supplement, GLUCERNA SHAKE, (GLUCERNA SHAKE) LIQD Take 237 mLs by mouth 2 (two) times daily between meals. 03/14/17   Lavina Hamman, MD  ferrous sulfate 325 (65 FE) MG tablet Take 1 tablet  (325 mg total) by mouth 2 (two) times daily with a meal. 03/14/17   Lavina Hamman, MD  folic acid (FOLVITE) 1 MG tablet Take 1 tablet (1 mg total) by mouth daily. 03/15/17   Lavina Hamman, MD  furosemide (LASIX) 40 MG tablet Take 1 tablet (40 mg total) by mouth daily. 03/21/17 04/20/17  Aline August, MD  gabapentin (NEURONTIN) 300 MG capsule Take 1 capsule (300 mg total) by mouth 2 (two) times daily. 04/03/17   Henson, Vickie L, NP-C  glucose 4 GM chewable tablet Chew 1 tablet (4 g total) by mouth as needed for low blood sugar. 08/10/14   Kandis Cocking A, CNM  insulin lispro (HUMALOG) 100 UNIT/ML injection Inject 0.02 mLs (2 Units total) into the skin 3 (three) times daily with meals. 04/09/17   Renato Shin, MD  LANTUS 100 UNIT/ML injection Inject 0.2 mLs (20 Units total) into the skin every morning. 04/07/17   Renato Shin, MD  lipase/protease/amylase (CREON) 36000 UNITS CPEP capsule Take 1 capsule (36,000 Units total) by mouth 3 (three) times daily with meals. 03/14/17   Lavina Hamman, MD  loperamide (IMODIUM) 2 MG capsule Take  1 capsule (2 mg total) by mouth 3 (three) times daily before meals. DO NOT TAKE IT FOR THE DAY YOU DONT HAVE A BOWEL MOVEMENT. 04/03/17   Henson, Vickie L, NP-C  methocarbamol (ROBAXIN) 500 MG tablet Take 1 tablet (500 mg total) by mouth every 8 (eight) hours as needed for muscle spasms. 03/14/17   Lavina Hamman, MD  metoCLOPramide (REGLAN) 5 MG tablet Take 5 mg by mouth 4 (four) times daily -  before meals and at bedtime.    [provider]  oxyCODONE (OXY IR/ROXICODONE) 5 MG immediate release tablet Take 1 tablet (5 mg total) by mouth every 4 (four) hours as needed for moderate pain or severe pain. 04/16/17   Theodis Blaze, MD  pantoprazole (PROTONIX) 40 MG tablet Take 1 tablet (40 mg total) by mouth 2 (two) times daily. 04/16/17   Theodis Blaze, MD  promethazine (PHENERGAN) 12.5 MG tablet Take 1 tablet (12.5 mg total) by mouth every 6 (six) hours as needed  for nausea or vomiting. 04/16/17   Theodis Blaze, MD    Family History Family History  Problem Relation Age of Onset  . Diabetes Mother   . Hypertension Father     Social History Social History   Tobacco Use  . Smoking status: Never Smoker  . Smokeless tobacco: Never Used  Substance Use Topics  . Alcohol use: Yes    Alcohol/week: 0.0 oz    Comment: Once every couple of months   . Drug use: No    Comment: Previous use     Allergies   Peanut-containing drug products and Lactose intolerance (gi)   Review of Systems Review of Systems  Constitutional: Negative for chills, fatigue, fever and unexpected weight change.  Eyes: Negative for visual disturbance.  Respiratory: Negative for shortness of breath.   Cardiovascular: Negative for chest pain.  Gastrointestinal: Positive for abdominal pain, diarrhea (chronic) and nausea. Negative for blood in stool and vomiting.  Genitourinary: Negative for difficulty urinating and dysuria.  Musculoskeletal: Positive for back pain. Negative for gait problem and neck pain.  Skin: Negative for rash.  Neurological: Negative for weakness, numbness and headaches.  Psychiatric/Behavioral: Negative for agitation.     Physical Exam Updated Vital Signs BP (!) 153/108   Pulse (!) 102   Temp 98.3 F (36.8 C) (Oral)   Resp (!) 21   SpO2 100%   Physical Exam  Constitutional: She appears well-developed and well-nourished. No distress.  HENT:  Head: Normocephalic and atraumatic.  Mouth/Throat: Oropharynx is clear and moist. No oropharyngeal exudate.  Eyes: Conjunctivae are normal. Pupils are equal, round, and reactive to light. Right eye exhibits no discharge. Left eye exhibits no discharge.  Neck: Normal range of motion. Neck supple.  Cardiovascular: Normal rate, regular rhythm and intact distal pulses. Exam reveals no friction rub.  No murmur heard. Pulmonary/Chest: Effort normal and breath sounds normal. No stridor. No respiratory  distress. She has no wheezes. She has no rales.  Abdominal: Soft. Bowel sounds are normal. There is no tenderness. There is no guarding.  Abdomen soft. Nontender to palpation. No guarding or rigidity. Negative Murphy's sign. Negative McBurney's point. No CVA tenderness.   Genitourinary:  Genitourinary Comments: Chaperone present for exam.  Two Primrose drains in the right buttocks.  No active drainage. No surrounding erythema, warmth or induration.  Musculoskeletal:  Tender to palpation over bilateral paraspinal muscles of the lumbar spine. No midline t-spine or l-spine tenderness. Strength 5/5 in bilateral knee flexion/extension and ankle  dorsiflexion/plantarflexion.  DP pulses 2+ bilaterally.  Lymphadenopathy:    She has no cervical adenopathy.  Neurological: She is alert. Coordination normal.  Patellar reflex 2+ bilaterally.  Distal sensation to light touch intact in bilateral lower extremities.  Gait normal and coordination and balance, although painful.  Skin: Skin is warm and dry. Capillary refill takes less than 2 seconds. She is not diaphoretic.  Psychiatric: She has a normal mood and affect. Her behavior is normal.  Nursing note and vitals reviewed.    ED Treatments / Results  Labs (all labs ordered are listed, but only abnormal results are displayed) Labs Reviewed  COMPREHENSIVE METABOLIC PANEL - Abnormal; Notable for the following components:      Result Value   Chloride 100 (*)    Glucose, Bld 180 (*)    Total Protein 8.9 (*)    Total Bilirubin 0.1 (*)    All other components within normal limits  URINALYSIS, ROUTINE W REFLEX MICROSCOPIC - Abnormal; Notable for the following components:   Glucose, UA 50 (*)    Hgb urine dipstick SMALL (*)    Leukocytes, UA TRACE (*)    Squamous Epithelial / LPF 0-5 (*)    All other components within normal limits  CBC  LIPASE, BLOOD  I-STAT BETA HCG BLOOD, ED ([facility], [facility], [facility] ONLY)    EKG  EKG Interpretation None        Radiology No results found.  Procedures Procedures (including critical care time)  Medications Ordered in ED Medications  ondansetron (ZOFRAN) injection 4 mg (4 mg Intravenous Given 05/02/17 1045)  sodium chloride 0.9 % bolus 1,000 mL (0 mLs Intravenous Stopped 05/02/17 1142)  HYDROmorphone (DILAUDID) injection 1 mg (1 mg Intravenous Given 05/02/17 1247)     Initial Impression / Assessment and Plan / ED Course  I have reviewed the triage vital signs and the nursing notes.  Pertinent labs & imaging results that were available during my care of the patient were reviewed by me and considered in my medical decision making (see chart for details).    Patient with a history of chron's disease and recent drainage of perirectal abscesses presents to the emergency department for persistent lumbar back pain. Denies fever, chills, fatigue, weight loss, night sweats. No neurological symptoms on exam or loss of bowel or bladder control. No concern for cauda equina. Lumbar xrays 04/27/17 without acute abnormality.   No erythema, induration, warmth over the buttocks to suggest recurrent cellulitis. Primrose drain site looks healthy, no active drainiage.   Labs reviewed. CBC and CMP unremarkable. Her glucose is elevated at 180, patient is a known diabetic. Lipase negative. BhCG negative. UA without evidence of infection.   Suspect that her pain is musculoskeletal given her altered sitting/lying posture given the drain placement from her previous abscesses. Pain managed in the emergency department and patient reports she feels improved. Plan to discharge home with instructions to use ibuprofen, tylenol, muscle relaxer, heat, massage for her pain. She has been instructed to follow up with PCP for further pain concerns. Counseled patient on return precautions. Her blood pressure was elevated in the ER today, counseled her to have this rechecked with PCP. She agrees and voices understanding to the above plan.  Discussed this patient with Dr. Leonette Monarch who also saw the patient and agrees with plan and discharge.   Final Clinical Impressions(s) / ED Diagnoses   Final diagnoses:  Acute bilateral low back pain without sciatica    ED Discharge Orders  Ordered    cyclobenzaprine (FLEXERIL) 10 MG tablet  2 times daily PRN     05/02/17 1350       Glyn Ade, [place] 05/02/17 1549

## 2017-05-02 NOTE — ED Notes (Signed)
Bed: WA02 Expected date:  Expected time:  Means of arrival:  Comments: 

## 2017-05-02 NOTE — ED Triage Notes (Signed)
Patient here with complaints of lower back pain. Reports that it is related to her surgery. States that she has a rectal abscess, in which she now has two drains. Norco and ibuprofen with no relief.

## 2017-05-02 NOTE — ED Notes (Addendum)
Pt has 2 pen-rose drains in right buttocks, one was put in about 1.5 weeks ago for abcess and one was put in back in December. Pt has had back pain for the past 2 weeks and has been in and out of PCP and ED's for pain.

## 2017-05-02 NOTE — ED Notes (Signed)
ED PA at bedside

## 2017-05-02 NOTE — ED Notes (Signed)
ED Provider at bedside. 

## 2017-05-02 NOTE — ED Notes (Signed)
IV removed from L AC by tech.

## 2017-05-02 NOTE — ED Provider Notes (Signed)
Medical screening examination/treatment/procedure(s) were conducted as a shared visit with non-physician practitioner(s) and myself.  I personally evaluated the patient during the encounter. Briefly, the patient is a 27 y.o. female with a history of Crohn's who had right buttock and perirectal abscesses status post drainage and currently has a percutaneous drain presents today with 3 weeks of right cervical/lumbosacral back pain.  She was evaluated last week for the same and diagnosed with muscle strain.  Patient reports that this pain is been persistent.  She has been doing heat pads, massage, oral and topical pain medication with minimal relief.  She denies any urinary or bowel incontinence.  No saddle anesthesia lower extremity numbness or weakness.  On exam patient does have a spastic right para spinal muscle do not feel that it advanced imaging is warranted at this time. Of note she was imaged 2 weeks ago with a CT scan that did not reveal any paraspinal muscle abscess at that time.  The patient is safe for discharge with strict return precautions.     EKG Interpretation None           Boy Delamater, Amadeo Garnet, MD 05/02/17 [phone]

## 2017-05-04 ENCOUNTER — Ambulatory Visit: Payer: 59 | Admitting: Family Medicine

## 2017-05-05 ENCOUNTER — Ambulatory Visit: Payer: 59 | Admitting: Family Medicine

## 2017-05-05 ENCOUNTER — Encounter: Payer: Self-pay | Admitting: Family Medicine

## 2017-05-07 ENCOUNTER — Encounter: Payer: Self-pay | Admitting: Endocrinology

## 2017-05-07 ENCOUNTER — Ambulatory Visit (INDEPENDENT_AMBULATORY_CARE_PROVIDER_SITE_OTHER): Payer: 59 | Admitting: Endocrinology

## 2017-05-07 ENCOUNTER — Encounter: Payer: Self-pay | Admitting: Family Medicine

## 2017-05-07 VITALS — BP 92/60 | HR 107 | Wt 137.8 lb

## 2017-05-07 DIAGNOSIS — E10649 Type 1 diabetes mellitus with hypoglycemia without coma: Secondary | ICD-10-CM | POA: Diagnosis not present

## 2017-05-07 MED ORDER — LANTUS 100 UNIT/ML ~~LOC~~ SOLN: 18.0000 [IU] | SUBCUTANEOUS | 11 refills | Status: DC

## 2017-05-07 NOTE — Patient Instructions (Signed)
Please continue the same novolog, and: Reduce the lantus to 18 units daily. Please come back for a follow-up appointment in 6 weeks.

## 2017-05-07 NOTE — Progress Notes (Signed)
Subjective:    Patient ID: [name], female    DOB: [birth year], 27 y.o.   MRN: [id number]  HPI Pt returns for f/u of diabetes mellitus: DM type: 1 Dx'ed: 2002 Complications: gastroparesis Therapy: insulin since dx GDM: never DKA: last episode was in 2018 Severe hypoglycemia: last episode was in 2018 Pancreatitis: never Pancreatic imaging: normal on 2019 CT Other: she takes multiple daily injections Interval history: she was recently in the hospital with vomiting.  She feels better now.  We downloaded continuous glucose monitor, and reviewed together.  Glucose varies from 60-300, but most are in the 100's.  It is lowest fasting Past Medical History:  Diagnosis Date  . Anxiety   . Crohn's disease ([facility])   . Diabetes type 1, uncontrolled ([facility]) 11/14/2011   Since age 71  . Fibromyalgia   . Gastroparesis   . Hypertension   . Infection    UTI April 2016    Past Surgical History:  Procedure Laterality Date  . ANKLE SURGERY    . CHOLECYSTECTOMY  11/15/2011   Procedure: LAPAROSCOPIC CHOLECYSTECTOMY WITH INTRAOPERATIVE CHOLANGIOGRAM;  Surgeon: [name], MD;  Location: [facility];  Service: General;  Laterality: N/A;  . ESOPHAGOGASTRODUODENOSCOPY  12/03/2011   Procedure: ESOPHAGOGASTRODUODENOSCOPY (EGD);  Surgeon: Theda Belfast, MD;  Location: [facility];  Service: Endoscopy;  Laterality: N/A;  . FLEXIBLE SIGMOIDOSCOPY N/A 03/10/2017   Procedure: FLEXIBLE SIGMOIDOSCOPY;  Surgeon: Jeani Hawking, MD;  [facility]: [facility];  Service: Endoscopy;  Laterality: N/A;  . INCISION AND DRAINAGE PERIRECTAL ABSCESS N/A 03/01/2017   Procedure: IRRIGATION AND DEBRIDEMENT PERIRECTAL ABSCESS;  Surgeon: Ovidio Kin, MD;  [facility]: [facility];  Service: General;  Laterality: N/A;  . IRRIGATION AND DEBRIDEMENT BUTTOCKS N/A 03/23/2017   Procedure: IRRIGATION AND DEBRIDEMENT BUTTOCKS, SETON PLACEMENT;  Surgeon: Romie Levee, MD;  Location: [facility];  Service: General;  Laterality: N/A;  .  LAPAROSCOPY  11/23/2011   Procedure: LAPAROSCOPY DIAGNOSTIC;  Surgeon: Mariella Saa, MD;  Location: [facility];  Service: General;  Laterality: N/A;    Social History   Socioeconomic History  . Marital status: Married    Spouse name: sergio  . Number of children: 0  . Years of education: college  . Highest education level: Not on file  Social Needs  . Financial resource strain: Not on file  . Food insecurity - worry: Not on file  . Food insecurity - inability: Not on file  . Transportation needs - medical: Not on file  . Transportation needs - non-medical: Not on file  Occupational History  . Occupation: [employer] call center    Employer: [employer]  Tobacco Use  . Smoking status: Never Smoker  . Smokeless tobacco: Never Used  Substance and Sexual Activity  . Alcohol use: No    Alcohol/week: 0.0 oz    Frequency: Never  . Drug use: No  . Sexual activity: Not Currently    Partners: Male    Birth control/protection: None  Other Topics Concern  . Not on file  Social History Narrative  . Not on file    Current Outpatient Medications on File Prior to Visit  Medication Sig Dispense Refill  . cholestyramine light (PREVALITE) 4 g packet Take 1 packet (4 g total) by mouth 3 (three) times daily. 180 packet 0  . citalopram (CELEXA) 20 MG tablet Take 1 tablet (20 mg total) by mouth daily. 30 tablet 0  . cyclobenzaprine (FLEXERIL) 10 MG tablet Take 1 tablet (10 mg total) by  mouth 2 (two) times daily as needed for muscle spasms. 20 tablet 0  . diphenoxylate-atropine (LOMOTIL) 2.5-0.025 MG tablet Take 2 tablets by mouth 4 (four) times daily. 30 tablet 0  . EPINEPHrine (EPIPEN 2-PAK) 0.3 mg/0.3 mL IJ SOAJ injection Inject 0.3 mLs (0.3 mg total) into the muscle as needed (allergic reaction).    . ferrous sulfate 325 (65 FE) MG tablet Take 1 tablet (325 mg total) by mouth 2 (two) times daily with a meal. 60 tablet 0  . folic acid (FOLVITE) 1 MG tablet Take 1 tablet (1 mg  total) by mouth daily. 30 tablet 0  . gabapentin (NEURONTIN) 300 MG capsule Take 1 capsule (300 mg total) by mouth 2 (two) times daily. 30 capsule 2  . glucose 4 GM chewable tablet Chew 1 tablet (4 g total) by mouth as needed for low blood sugar. 50 tablet 12  . HYDROcodone-acetaminophen (NORCO/VICODIN) 5-325 MG tablet Take 1-2 tablets by mouth every 6 (six) hours as needed for moderate pain. for pain  0  . ibuprofen (ADVIL,MOTRIN) 800 MG tablet Take 800 mg by mouth 3 (three) times daily.  0  . insulin lispro (HUMALOG) 100 UNIT/ML injection Inject 0.02 mLs (2 Units total) into the skin 3 (three) times daily with meals. (Patient taking differently: Inject 1-9 Units into the skin 3 (three) times daily with meals. Sliding scale) 10 mL 11  . lipase/protease/amylase (CREON) 36000 UNITS CPEP capsule Take 1 capsule (36,000 Units total) by mouth 3 (three) times daily with meals. 270 capsule 0  . loperamide (IMODIUM) 2 MG capsule Take 1 capsule (2 mg total) by mouth 3 (three) times daily before meals. DO NOT TAKE IT FOR THE DAY YOU DONT HAVE A BOWEL MOVEMENT. 30 capsule 2  . methocarbamol (ROBAXIN) 500 MG tablet Take 1 tablet (500 mg total) by mouth every 8 (eight) hours as needed for muscle spasms. 30 tablet 0  . metoCLOPramide (REGLAN) 5 MG tablet Take 5 mg by mouth 4 (four) times daily -  before meals and at bedtime.    . promethazine (PHENERGAN) 12.5 MG tablet Take 1 tablet (12.5 mg total) by mouth every 6 (six) hours as needed for nausea or vomiting. 20 tablet 0  . furosemide (LASIX) 40 MG tablet Take 1 tablet (40 mg total) by mouth daily. 30 tablet 0   No current facility-administered medications on file prior to visit.     Allergies  Allergen Reactions  . Peanut-Containing Drug Products Anaphylaxis and Other (See Comments)    Pt states that she is allergic to [place] nuts; can tolerate peanuts  . Lactose Intolerance (Gi) Other (See Comments)    Gi upset     Family History  Problem Relation Age  of Onset  . Diabetes Mother   . Hypertension Father     BP 92/60 (BP Location: Left Arm, Patient Position: Sitting, Cuff Size: Normal)   Pulse (!) 107   Wt 137 lb 12.8 oz (62.5 kg)   SpO2 99%   BMI 21.58 kg/m    Review of Systems Denies LOC.  She has lost more weight.     Objective:   Physical Exam VITAL SIGNS:  See vs page GENERAL: no distress Pulses: dorsalis pedis intact bilat.   MSK: no deformity of the feet CV: no leg edema Skin:  no ulcer on the feet, but the skin is dry.  normal color and temp on the feet. Neuro: sensation is intact to touch on the feet  Assessment & Plan:  Type 1 DM: still overcontrolled  Patient Instructions  Please continue the same novolog, and: Reduce the lantus to 18 units daily. Please come back for a follow-up appointment in 6 weeks.

## 2017-05-08 ENCOUNTER — Emergency Department (HOSPITAL_COMMUNITY)
Admission: EM | Admit: 2017-05-08 | Discharge: 2017-05-08 | Disposition: A | Discharge status: Home / Self Care | Payer: 59 | Attending: Emergency Medicine | Admitting: Emergency Medicine

## 2017-05-08 ENCOUNTER — Other Ambulatory Visit: Payer: Self-pay

## 2017-05-08 ENCOUNTER — Encounter (HOSPITAL_COMMUNITY): Payer: Self-pay | Admitting: Emergency Medicine

## 2017-05-08 DIAGNOSIS — Z794 Long term (current) use of insulin: Secondary | ICD-10-CM | POA: Diagnosis not present

## 2017-05-08 DIAGNOSIS — L0231 Cutaneous abscess of buttock: Secondary | ICD-10-CM | POA: Diagnosis not present

## 2017-05-08 DIAGNOSIS — M79606 Pain in leg, unspecified: Secondary | ICD-10-CM | POA: Diagnosis present

## 2017-05-08 DIAGNOSIS — Z91010 Allergy to peanuts: Secondary | ICD-10-CM | POA: Insufficient documentation

## 2017-05-08 DIAGNOSIS — Z79899 Other long term (current) drug therapy: Secondary | ICD-10-CM | POA: Diagnosis not present

## 2017-05-08 DIAGNOSIS — E119 Type 2 diabetes mellitus without complications: Secondary | ICD-10-CM | POA: Insufficient documentation

## 2017-05-08 DIAGNOSIS — I10 Essential (primary) hypertension: Secondary | ICD-10-CM | POA: Insufficient documentation

## 2017-05-08 LAB — CBC WITH DIFFERENTIAL/PLATELET
Basophils Absolute: 0 10*3/uL (ref 0.0–0.1)
Basophils Relative: 1 %
Eosinophils Absolute: 0.2 10*3/uL (ref 0.0–0.7)
Eosinophils Relative: 5 %
HCT: 34.3 % — ABNORMAL LOW (ref 36.0–46.0)
Hemoglobin: 11.9 g/dL — ABNORMAL LOW (ref 12.0–15.0)
Lymphocytes Relative: 51 %
Lymphs Abs: 2.2 10*3/uL (ref 0.7–4.0)
MCH: 28.5 pg (ref 26.0–34.0)
MCHC: 34.7 g/dL (ref 30.0–36.0)
MCV: 82.3 fL (ref 78.0–100.0)
Monocytes Absolute: 0.3 10*3/uL (ref 0.1–1.0)
Monocytes Relative: 8 %
Neutro Abs: 1.5 10*3/uL — ABNORMAL LOW (ref 1.7–7.7)
Neutrophils Relative %: 35 %
Platelets: 185 10*3/uL (ref 150–400)
RBC: 4.17 MIL/uL (ref 3.87–5.11)
RDW: 11.7 % (ref 11.5–15.5)
WBC: 4.3 10*3/uL (ref 4.0–10.5)

## 2017-05-08 LAB — BASIC METABOLIC PANEL
Anion gap: 5 (ref 5–15)
BUN: 22 mg/dL — ABNORMAL HIGH (ref 6–20)
CO2: 28 mmol/L (ref 22–32)
Calcium: 8.9 mg/dL (ref 8.9–10.3)
Chloride: 104 mmol/L (ref 101–111)
Creatinine, Ser: 0.45 mg/dL (ref 0.44–1.00)
GFR calc Af Amer: >60 mL/min (ref >60)
GFR calc non Af Amer: >60 mL/min (ref >60)
Glucose, Bld: 84 mg/dL (ref 65–99)
Potassium: 4.2 mmol/L (ref 3.5–5.1)
Sodium: 137 mmol/L (ref 135–145)

## 2017-05-08 MED ORDER — DIAZEPAM 5 MG PO TABS
5.0000 mg | ORAL_TABLET | Freq: Once | ORAL | Status: AC
  Administered 2017-05-08: 5 mg via ORAL
  Filled 2017-05-08: qty 1

## 2017-05-08 MED ORDER — MORPHINE SULFATE (PF) 4 MG/ML IV SOLN
4.0000 mg | Freq: Once | INTRAVENOUS | Status: AC
  Administered 2017-05-08: 4 mg via INTRAMUSCULAR
  Filled 2017-05-08: qty 1

## 2017-05-08 MED ORDER — OXYCODONE-ACETAMINOPHEN 7.5-325 MG PO TABS: 1.0000 | ORAL_TABLET | Freq: Four times a day (QID) | ORAL | 0 refills | Status: AC | PRN

## 2017-05-08 NOTE — ED Provider Notes (Signed)
[facility] DEPT Provider Note   CSN: [id number] Arrival date & time: 05/08/17  0350     History   Chief Complaint Chief Complaint  Patient presents with  . Abscess    HPI [name] is a 27 y.o. female with PMH of type 1 DM and Crohn's Disease presenting with the chief complaint of bilateral leg pain in setting of right buttock abscess.   Patient states she began having sharp, constant bilateral posterior leg pain last night. She states the pain is 9/10 in severity without associated aggreavting or alleviating factors. She has tried Norco, ibuprofen, and heating pads, which did not relieve the pain. The patient has a perirectal abscess with two primrose drains in place; most recent surgical intervention was 04/22/2017 (first I&D was on 12/2). The patient was treated post-operatively with clindamycin and she completed that course. States she saw her general surgeon last week who stated the wound is healing slowly.  The patient states that her abscess pain has progressively worsened in the past few days, but the new onset bilateral postero leg pain is what brought her to the ED. She denies fevers, but endorses chills. Denies numbness in lower extremities. Has chronic diarrhea with recent CD diagnosis, and is only on supportive treatment(antidiarrheals and creon). Denies vomiting or abdominal pain. States the site has not had increased drainage or foul smelling discharge.         Past Medical History:  Diagnosis Date  . Anxiety   . Crohn's disease ([place])   . Diabetes type 1, uncontrolled ([place]) 11/14/2011   Since age 60  . Fibromyalgia   . Gastroparesis   . Hypertension   . Infection    UTI April 2016    Patient Active Problem List   Diagnosis Date Noted  . Intractable nausea and vomiting 04/11/2017  . Nausea and vomiting 04/10/2017  . Essential hypertension   . Colitis   . Crohn's disease ([place]) 04/03/2017  . Abscess and cellulitis of gluteal  region 03/24/2017  . Cellulitis and abscess of buttock 03/23/2017  . Malnutrition of moderate degree 03/19/2017  . Anasarca 03/16/2017  . 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 Diarrhea 02/27/2017  . Lower GI bleed 02/27/2017  . DKA (diabetic ketoacidosis) ([place]) 02/26/2017  . Acute metabolic encephalopathy   . DKA, type 1 ([place]) 06/11/2013  . Orthostatic hypotension dysautonomic syndrome ([place]) 04/28/2013  . Body aches 04/28/2013  . Orthostatic hypotension 04/28/2013  . Diabetes mellitus type 1 ([place]) 04/28/2013  . Protein-calorie malnutrition, severe ([place]) 04/28/2013  . Noncompliance with diabetes treatment 09/01/2012  . Dyspnea 02/28/2012  . Sinus tachycardia 02/28/2012  . Hyperglycemia without ketosis 02/28/2012  . Chest pain 12/04/2011  . Diabetes type 1, uncontrolled ([place]) 11/14/2011  . Hypertension associated with diabetes ([place]) 11/14/2011  . Gastroparesis 11/14/2011  . Hyponatremia 09/19/2011  . Abdominal pain 09/18/2011  . Metabolic alkalosis 24/11/7351  . Diabetic hyperosmolar non-ketotic state ([place]) 09/18/2011  . DIAB W/UNSPEC COMP TYPE I [JUV TYPE] UNCNTRL 06/04/2010  . CONSTIPATION 01/01/2010  . RECTAL PAIN 01/01/2010    Past Surgical History:  Procedure Laterality Date  . ANKLE SURGERY    . CHOLECYSTECTOMY  11/15/2011   Procedure: LAPAROSCOPIC CHOLECYSTECTOMY WITH INTRAOPERATIVE CHOLANGIOGRAM;  Surgeon: [name], MD;  Location: [facility];  Service: General;  Laterality: N/A;  . ESOPHAGOGASTRODUODENOSCOPY  12/03/2011   Procedure: ESOPHAGOGASTRODUODENOSCOPY (EGD);  Surgeon: Beryle Beams, MD;  Location: [facility];  Service: Endoscopy;  Laterality: N/A;  .  FLEXIBLE SIGMOIDOSCOPY N/A 03/10/2017   Procedure: FLEXIBLE SIGMOIDOSCOPY;  Surgeon: Carol Ada, MD;  Location: [facility];  Service: Endoscopy;  Laterality: N/A;  . INCISION AND DRAINAGE PERIRECTAL ABSCESS N/A 03/01/2017   Procedure: IRRIGATION AND DEBRIDEMENT  PERIRECTAL ABSCESS;  Surgeon: Alphonsa Overall, MD;  Location: [facility];  Service: General;  Laterality: N/A;  . IRRIGATION AND DEBRIDEMENT BUTTOCKS N/A 03/23/2017   Procedure: IRRIGATION AND DEBRIDEMENT BUTTOCKS, SETON PLACEMENT;  Surgeon: [name], MD;  Location: [facility];  Service: General;  Laterality: N/A;  . LAPAROSCOPY  11/23/2011   Procedure: LAPAROSCOPY DIAGNOSTIC;  Surgeon: Edward Jolly, MD;  Location: [facility];  Service: General;  Laterality: N/A;    OB History    Gravida Para Term Preterm AB Living   1 1   1   1    SAB TAB Ectopic Multiple Live Births         0 1       Home Medications    Prior to Admission medications   Medication Sig Start Date End Date Taking? Authorizing Provider  cholestyramine light (PREVALITE) 4 g packet Take 1 packet (4 g total) by mouth 3 (three) times daily. 03/14/17  Yes Lavina Hamman, MD  citalopram (CELEXA) 20 MG tablet Take 1 tablet (20 mg total) by mouth daily. 03/15/17  Yes Lavina Hamman, MD  cyclobenzaprine (FLEXERIL) 10 MG tablet Take 1 tablet (10 mg total) by mouth 2 (two) times daily as needed for muscle spasms. 05/02/17  Yes Glyn Ade, PA-C  diphenoxylate-atropine (LOMOTIL) 2.5-0.025 MG tablet Take 2 tablets by mouth 4 (four) times daily. 04/08/17  Yes Henson, Vickie L, NP-C  ferrous sulfate 325 (65 FE) MG tablet Take 1 tablet (325 mg total) by mouth 2 (two) times daily with a meal. 03/14/17  Yes Lavina Hamman, MD  folic acid (FOLVITE) 1 MG tablet Take 1 tablet (1 mg total) by mouth daily. 03/15/17  Yes Lavina Hamman, MD  gabapentin (NEURONTIN) 300 MG capsule Take 1 capsule (300 mg total) by mouth 2 (two) times daily. 04/03/17  Yes Henson, Vickie L, NP-C  HYDROcodone-acetaminophen (NORCO/VICODIN) 5-325 MG tablet Take 1-2 tablets by mouth every 6 (six) hours as needed for moderate pain. for pain 04/27/17  Yes [provider]  ibuprofen (ADVIL,MOTRIN) 800 MG tablet Take 800 mg by mouth 3 (three) times daily. 04/27/17  Yes  [provider]  insulin lispro (HUMALOG) 100 UNIT/ML injection Inject 0.02 mLs (2 Units total) into the skin 3 (three) times daily with meals. Patient taking differently: Inject 1-9 Units into the skin 3 (three) times daily with meals. Sliding scale 04/09/17  Yes Renato Shin, MD  LANTUS 100 UNIT/ML injection Inject 0.18 mLs (18 Units total) into the skin every morning. 05/07/17  Yes Renato Shin, MD  lipase/protease/amylase (CREON) 36000 UNITS CPEP capsule Take 1 capsule (36,000 Units total) by mouth 3 (three) times daily with meals. 03/14/17  Yes Lavina Hamman, MD  loperamide (IMODIUM) 2 MG capsule Take 1 capsule (2 mg total) by mouth 3 (three) times daily before meals. DO NOT TAKE IT FOR THE DAY YOU DONT HAVE A BOWEL MOVEMENT. 04/03/17  Yes Henson, Vickie L, NP-C  methocarbamol (ROBAXIN) 500 MG tablet Take 1 tablet (500 mg total) by mouth every 8 (eight) hours as needed for muscle spasms. 03/14/17  Yes Lavina Hamman, MD  metoCLOPramide (REGLAN) 5 MG tablet Take 5 mg by mouth 4 (four) times daily -  before meals and at bedtime.  Yes [provider]  promethazine (PHENERGAN) 12.5 MG tablet Take 1 tablet (12.5 mg total) by mouth every 6 (six) hours as needed for nausea or vomiting. 04/16/17  Yes Theodis Blaze, MD  EPINEPHrine (EPIPEN 2-PAK) 0.3 mg/0.3 mL IJ SOAJ injection Inject 0.3 mLs (0.3 mg total) into the muscle as needed (allergic reaction). 03/21/17   Aline August, MD  furosemide (LASIX) 40 MG tablet Take 1 tablet (40 mg total) by mouth daily. 03/21/17 05/02/17  Aline August, MD  glucose 4 GM chewable tablet Chew 1 tablet (4 g total) by mouth as needed for low blood sugar. 08/10/14   Morene Crocker, CNM  oxyCODONE-acetaminophen (PERCOCET) 7.5-325 MG tablet Take 1 tablet by mouth every 6 (six) hours as needed for up to 5 days for severe pain. 05/08/17 05/13/17  Melanee Spry, MD    Family History Family History  Problem Relation Age of Onset  . Diabetes Mother     . Hypertension Father     Social History Social History   Tobacco Use  . Smoking status: Never Smoker  . Smokeless tobacco: Never Used  Substance Use Topics  . Alcohol use: No    Alcohol/week: 0.0 oz    Frequency: Never  . Drug use: No     Allergies   Peanut-containing drug products and Lactose intolerance (gi)   Review of Systems Review of Systems  Constitutional: Positive for chills and diaphoresis. Negative for fever.  Respiratory: Negative for shortness of breath.   Cardiovascular: Negative for chest pain and leg swelling.  Gastrointestinal: Positive for diarrhea. Negative for blood in stool.  Skin: Positive for wound.  Neurological: Negative for numbness.     Physical Exam Updated Vital Signs BP (!) 126/92 (BP Location: Left Arm)   Pulse (!) 107   Temp 98.2 F (36.8 C) (Oral)   Resp 17   SpO2 100%   Physical Exam   ED Treatments / Results  Labs (all labs ordered are listed, but only abnormal results are displayed) Labs Reviewed  CBC WITH DIFFERENTIAL/PLATELET - Abnormal; Notable for the following components:      Result Value   Hemoglobin 11.9 (*)    HCT 34.3 (*)    Neutro Abs 1.5 (*)    All other components within normal limits  BASIC METABOLIC PANEL - Abnormal; Notable for the following components:   BUN 22 (*)    All other components within normal limits    EKG  EKG Interpretation None       Radiology No results found.  Procedures Procedures (including critical care time)  Medications Ordered in ED Medications  diazepam (VALIUM) tablet 5 mg (5 mg Oral Given 05/08/17 1024)  morphine 4 MG/ML injection 4 mg (4 mg Intramuscular Given 05/08/17 1024)     Initial Impression / Assessment and Plan / ED Course  I have reviewed the triage vital signs and the nursing notes.  Pertinent labs & imaging results that were available during my care of the patient were reviewed by me and considered in my medical decision making (see chart for  details).   27 yo female with PMH of Type 1 DM, Crohn's disease, and perirectal abscess presenting with pain associated with her abscess. Patient is afebrile, mildly tachycardic, and normotensive. CBC and BMET are within normal limits. On examination, the patient has 2 primrose drains in place. Mild TTP around abscess but no swelling or erythema concerning for cellulitis. Patient also has open incision on intragluteal cleft that is draining  purulent, non-odorous discharge. Neurological exam was within normal limits, with no gross focal neurological deficits of her lower extremities, sensation intact. Patient symptoms likely due to continuous, uncontrolled pain from her abscess. Do not suspect necrotizing fascitis or cellulitis.    Advised patient to follow up with general surgery within the next few days to have the area evaluated. Prescribed short course of percocet for pain control.   Final Clinical Impressions(s) / ED Diagnoses   Final diagnoses:  Abscess of buttock, right    ED Discharge Orders        Ordered    oxyCODONE-acetaminophen (PERCOCET) 7.5-325 MG tablet  Every 6 hours PRN     05/08/17 1313       Melanee Spry, MD 05/08/17 1343

## 2017-05-08 NOTE — ED Triage Notes (Signed)
Pt states she has an abscess on her right buttock that has been there since December  Pt states she has 2 drains in it  Pt states it is hurting tonight and she is having pain in her back and leg  Pt is tearful in triage

## 2017-05-08 NOTE — ED Provider Notes (Signed)
I saw and evaluated the patient, reviewed the resident's note and I agree with the findings and plan.   EKG Interpretation None      27 year old female here here complaining of increasing pain to her prior surgical site.  No evidence of infection on exam here.  She is afebrile.  Will medicate for pain and have her follow-up with her surgeon.    Lacretia Leigh, MD 05/08/17 1159

## 2017-05-08 NOTE — ED Triage Notes (Signed)
Pt stated that she checked her blood sugar and it was 54. Pt was given a snack at her request

## 2017-05-08 NOTE — ED Notes (Signed)
Dressings have been changed on Perineum and Buttocks areas where Pen drain is located.

## 2017-05-08 NOTE — Discharge Instructions (Signed)
Ms. Colligan,   Your labs were reassuring and I do not believe your abscess has worsened. Please follow up with your general surgeon and primary care doctor in several days. If you symptoms worsen, you develop a fever, have severe pain, and foul smelling drainage please be evaluated by medical personnel.   I have written you a prescription for Percocet for pain management. Please take as needed for pain every 6 hours.

## 2017-05-08 NOTE — ED Notes (Signed)
Pt placed in triage chair and reclined to relieve pressure off of her abscess  Warm blanket given

## 2017-05-11 ENCOUNTER — Encounter (HOSPITAL_COMMUNITY): Payer: Self-pay

## 2017-05-11 DIAGNOSIS — Z91010 Allergy to peanuts: Secondary | ICD-10-CM | POA: Insufficient documentation

## 2017-05-11 DIAGNOSIS — I10 Essential (primary) hypertension: Secondary | ICD-10-CM | POA: Diagnosis not present

## 2017-05-11 DIAGNOSIS — Z79899 Other long term (current) drug therapy: Secondary | ICD-10-CM | POA: Diagnosis not present

## 2017-05-11 DIAGNOSIS — R102 Pelvic and perineal pain: Secondary | ICD-10-CM | POA: Insufficient documentation

## 2017-05-11 DIAGNOSIS — M79604 Pain in right leg: Secondary | ICD-10-CM | POA: Diagnosis not present

## 2017-05-11 DIAGNOSIS — E109 Type 1 diabetes mellitus without complications: Secondary | ICD-10-CM | POA: Diagnosis not present

## 2017-05-11 DIAGNOSIS — K5090 Crohn's disease, unspecified, without complications: Secondary | ICD-10-CM | POA: Insufficient documentation

## 2017-05-11 DIAGNOSIS — M79662 Pain in left lower leg: Secondary | ICD-10-CM | POA: Diagnosis not present

## 2017-05-11 DIAGNOSIS — M79661 Pain in right lower leg: Secondary | ICD-10-CM | POA: Diagnosis not present

## 2017-05-11 DIAGNOSIS — M79605 Pain in left leg: Secondary | ICD-10-CM | POA: Diagnosis not present

## 2017-05-11 DIAGNOSIS — Z794 Long term (current) use of insulin: Secondary | ICD-10-CM | POA: Insufficient documentation

## 2017-05-11 DIAGNOSIS — R1031 Right lower quadrant pain: Secondary | ICD-10-CM | POA: Diagnosis not present

## 2017-05-11 NOTE — ED Triage Notes (Signed)
Pt complains of bilateral leg pain that is getting worse and a recurrent abscess on her buttocks

## 2017-05-12 ENCOUNTER — Emergency Department (HOSPITAL_COMMUNITY)
Admission: EM | Admit: 2017-05-12 | Discharge: 2017-05-12 | Disposition: A | Discharge status: Home / Self Care | Payer: 59 | Attending: Emergency Medicine | Admitting: Emergency Medicine

## 2017-05-12 ENCOUNTER — Encounter: Payer: Self-pay | Admitting: Family Medicine

## 2017-05-12 DIAGNOSIS — M79604 Pain in right leg: Secondary | ICD-10-CM

## 2017-05-12 DIAGNOSIS — M79605 Pain in left leg: Secondary | ICD-10-CM

## 2017-05-12 DIAGNOSIS — R1030 Lower abdominal pain, unspecified: Secondary | ICD-10-CM

## 2017-05-12 LAB — CBC WITH DIFFERENTIAL/PLATELET
Basophils Absolute: 0 10*3/uL (ref 0.0–0.1)
Basophils Relative: 0 %
Eosinophils Absolute: 0.2 10*3/uL (ref 0.0–0.7)
Eosinophils Relative: 4 %
HCT: 33.3 % — ABNORMAL LOW (ref 36.0–46.0)
Hemoglobin: 11.6 g/dL — ABNORMAL LOW (ref 12.0–15.0)
Lymphocytes Relative: 48 %
Lymphs Abs: 2.5 10*3/uL (ref 0.7–4.0)
MCH: 28.6 pg (ref 26.0–34.0)
MCHC: 34.8 g/dL (ref 30.0–36.0)
MCV: 82 fL (ref 78.0–100.0)
Monocytes Absolute: 0.3 10*3/uL (ref 0.1–1.0)
Monocytes Relative: 5 %
Neutro Abs: 2.3 10*3/uL (ref 1.7–7.7)
Neutrophils Relative %: 43 %
Platelets: 150 10*3/uL (ref 150–400)
RBC: 4.06 MIL/uL (ref 3.87–5.11)
RDW: 11.5 % (ref 11.5–15.5)
WBC: 5.2 10*3/uL (ref 4.0–10.5)

## 2017-05-12 LAB — URINALYSIS, ROUTINE W REFLEX MICROSCOPIC
Bilirubin Urine: NEGATIVE
Glucose, UA: NEGATIVE mg/dL
Ketones, ur: NEGATIVE mg/dL
Leukocytes, UA: NEGATIVE
Nitrite: NEGATIVE
Protein, ur: NEGATIVE mg/dL
Specific Gravity, Urine: 1.011 (ref 1.005–1.030)
pH: 5 (ref 5.0–8.0)

## 2017-05-12 LAB — PREGNANCY, URINE: Preg Test, Ur: NEGATIVE

## 2017-05-12 LAB — COMPREHENSIVE METABOLIC PANEL
ALT: 24 U/L (ref 14–54)
AST: 31 U/L (ref 15–41)
Albumin: 3.4 g/dL — ABNORMAL LOW (ref 3.5–5.0)
Alkaline Phosphatase: 76 U/L (ref 38–126)
Anion gap: 9 (ref 5–15)
BUN: 14 mg/dL (ref 6–20)
CO2: 29 mmol/L (ref 22–32)
Calcium: 9.2 mg/dL (ref 8.9–10.3)
Chloride: 99 mmol/L — ABNORMAL LOW (ref 101–111)
Creatinine, Ser: 0.46 mg/dL (ref 0.44–1.00)
GFR calc Af Amer: >60 mL/min (ref >60)
GFR calc non Af Amer: >60 mL/min (ref >60)
Glucose, Bld: 164 mg/dL — ABNORMAL HIGH (ref 65–99)
Potassium: 4.1 mmol/L (ref 3.5–5.1)
Sodium: 137 mmol/L (ref 135–145)
Total Bilirubin: 0.5 mg/dL (ref 0.3–1.2)
Total Protein: 7.8 g/dL (ref 6.5–8.1)

## 2017-05-12 MED ORDER — OXYCODONE-ACETAMINOPHEN 5-325 MG PO TABS
2.0000 | ORAL_TABLET | Freq: Once | ORAL | Status: AC
  Administered 2017-05-12: 2 via ORAL
  Filled 2017-05-12: qty 2

## 2017-05-12 MED ORDER — HYDROMORPHONE HCL 1 MG/ML IJ SOLN
1.0000 mg | Freq: Once | INTRAMUSCULAR | Status: AC
  Administered 2017-05-12: 1 mg via INTRAVENOUS
  Filled 2017-05-12: qty 1

## 2017-05-12 NOTE — Discharge Instructions (Signed)
Continue follow-up with your primary care doctor as well as your general surgeons.  It may be beneficial to discuss referral to pain management with your primary care doctor given your frequent ED visits for similar pain complaints.  We are unable to prescribe any additional pain medicine to you at this time.  Continue your prescribed medications.  You may return to the ED for other new or concerning symptoms.

## 2017-05-12 NOTE — ED Provider Notes (Signed)
[facility]-EMERGENCY DEPT Provider Note   CSN: [id number] Arrival date & time: 05/11/17  2107     History   Chief Complaint Chief Complaint  Patient presents with  . Leg Pain  . abscess on buttock    HPI [name] is a 27 y.o. female.  27 year old female with a history of Crohn's disease, diabetes mellitus, hypertension, gastroparesis, perirectal abscess with two primrose drains in place (most recent surgical intervention was 04/22/2017, first I&D was on 12/2) presents to the ED for c/o abdominal pain.  She reports sharp, intermittent pelvic pain over the past 48 hours.  She denies any modifying factors of her symptoms.  She does report taking ibuprofen and Percocet for pain without relief.  Patient also complaining of burning pain in her bilateral lower extremities.  She has had similar pain for which she has been seen in the emergency department.  She denies any fevers since last evaluation.  She has not scheduled additional follow-up with her surgeon, but does have primary care follow-up later today.  No urinary symptoms, melena, hematochezia, vomiting.  She is not currently on antibiotics.   The history is provided by the patient. No language interpreter was used.    Past Medical History:  Diagnosis Date  . Anxiety   . Crohn's disease (HCC)   . Diabetes type 1, uncontrolled (HCC) 11/14/2011   Since age 58  . Fibromyalgia   . Gastroparesis   . Hypertension   . Infection    UTI April 2016    Patient Active Problem List   Diagnosis Date Noted  . Intractable nausea and vomiting 04/11/2017  . Nausea and vomiting 04/10/2017  . Essential hypertension   . Colitis   . Crohn's disease (HCC) 04/03/2017  . Abscess and cellulitis of gluteal region 03/24/2017  . Cellulitis and abscess of buttock 03/23/2017  . Malnutrition of moderate degree 03/19/2017  . Anasarca 03/16/2017  . 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  Diarrhea 02/27/2017  . Lower GI bleed 02/27/2017  . DKA (diabetic ketoacidosis) (HCC) 02/26/2017  . Acute metabolic encephalopathy   . DKA, type 1 (HCC) 06/11/2013  . Orthostatic hypotension dysautonomic syndrome (HCC) 04/28/2013  . Body aches 04/28/2013  . Orthostatic hypotension 04/28/2013  . Diabetes mellitus type 1 (HCC) 04/28/2013  . Protein-calorie malnutrition, severe (HCC) 04/28/2013  . Noncompliance with diabetes treatment 09/01/2012  . Dyspnea 02/28/2012  . Sinus tachycardia 02/28/2012  . Hyperglycemia without ketosis 02/28/2012  . Chest pain 12/04/2011  . Diabetes type 1, uncontrolled (HCC) 11/14/2011  . Hypertension associated with diabetes (HCC) 11/14/2011  . Gastroparesis 11/14/2011  . Hyponatremia 09/19/2011  . Abdominal pain 09/18/2011  . Metabolic alkalosis 09/18/2011  . Diabetic hyperosmolar non-ketotic state (HCC) 09/18/2011  . DIAB W/UNSPEC COMP TYPE I [JUV TYPE] UNCNTRL 06/04/2010  . CONSTIPATION 01/01/2010  . RECTAL PAIN 01/01/2010    Past Surgical History:  Procedure Laterality Date  . ANKLE SURGERY    . CHOLECYSTECTOMY  11/15/2011   Procedure: LAPAROSCOPIC CHOLECYSTECTOMY WITH INTRAOPERATIVE CHOLANGIOGRAM;  Surgeon: [name], MD;  Location: [facility];  Service: General;  Laterality: N/A;  . ESOPHAGOGASTRODUODENOSCOPY  12/03/2011   Procedure: ESOPHAGOGASTRODUODENOSCOPY (EGD);  Surgeon: Theda Belfast, MD;  Location: [facility];  Service: Endoscopy;  Laterality: N/A;  . FLEXIBLE SIGMOIDOSCOPY N/A 03/10/2017   Procedure: FLEXIBLE SIGMOIDOSCOPY;  Surgeon: Jeani Hawking, MD;  Location: [facility];  Service: Endoscopy;  Laterality: N/A;  . INCISION AND DRAINAGE PERIRECTAL ABSCESS N/A 03/01/2017  Procedure: IRRIGATION AND DEBRIDEMENT PERIRECTAL ABSCESS;  Surgeon: Ovidio Kin, MD;  Location: [facility];  Service: General;  Laterality: N/A;  . IRRIGATION AND DEBRIDEMENT BUTTOCKS N/A 03/23/2017   Procedure: IRRIGATION AND DEBRIDEMENT BUTTOCKS, SETON PLACEMENT;   Surgeon: Romie Levee, MD;  Location: [facility];  Service: General;  Laterality: N/A;  . LAPAROSCOPY  11/23/2011   Procedure: LAPAROSCOPY DIAGNOSTIC;  Surgeon: Mariella Saa, MD;  Location: [facility];  Service: General;  Laterality: N/A;    OB History    Gravida Para Term Preterm AB Living   1 1   1   1    SAB TAB Ectopic Multiple Live Births         0 1       Home Medications    Prior to Admission medications   Medication Sig Start Date End Date Taking? Authorizing Provider  cholestyramine light (PREVALITE) 4 g packet Take 1 packet (4 g total) by mouth 3 (three) times daily. 03/14/17  Yes Rolly Salter, MD  citalopram (CELEXA) 20 MG tablet Take 1 tablet (20 mg total) by mouth daily. 03/15/17  Yes Rolly Salter, MD  cyclobenzaprine (FLEXERIL) 10 MG tablet Take 1 tablet (10 mg total) by mouth 2 (two) times daily as needed for muscle spasms. 05/02/17  Yes Kellie Shropshire, PA-C  diphenoxylate-atropine (LOMOTIL) 2.5-0.025 MG tablet Take 2 tablets by mouth 4 (four) times daily. 04/08/17  Yes Henson, Vickie L, NP-C  EPINEPHrine (EPIPEN 2-PAK) 0.3 mg/0.3 mL IJ SOAJ injection Inject 0.3 mLs (0.3 mg total) into the muscle as needed (allergic reaction). 03/21/17  Yes Glade Lloyd, MD  ferrous sulfate 325 (65 FE) MG tablet Take 1 tablet (325 mg total) by mouth 2 (two) times daily with a meal. 03/14/17  Yes Rolly Salter, MD  folic acid (FOLVITE) 1 MG tablet Take 1 tablet (1 mg total) by mouth daily. 03/15/17  Yes Rolly Salter, MD  gabapentin (NEURONTIN) 300 MG capsule Take 1 capsule (300 mg total) by mouth 2 (two) times daily. 04/03/17  Yes Henson, Vickie L, NP-C  glucose 4 GM chewable tablet Chew 1 tablet (4 g total) by mouth as needed for low blood sugar. 08/10/14  Yes Denney, Rachelle A, CNM  HYDROcodone-acetaminophen (NORCO/VICODIN) 5-325 MG tablet Take 1-2 tablets by mouth every 6 (six) hours as needed for moderate pain. for pain 04/27/17  Yes [provider]  ibuprofen  (ADVIL,MOTRIN) 800 MG tablet Take 800 mg by mouth 3 (three) times daily. 04/27/17  Yes [provider]  insulin lispro (HUMALOG) 100 UNIT/ML injection Inject 0.02 mLs (2 Units total) into the skin 3 (three) times daily with meals. Patient taking differently: Inject 1-9 Units into the skin 3 (three) times daily with meals. Sliding scale 04/09/17  Yes Romero Belling, MD  LANTUS 100 UNIT/ML injection Inject 0.18 mLs (18 Units total) into the skin every morning. 05/07/17  Yes Romero Belling, MD  lipase/protease/amylase (CREON) 36000 UNITS CPEP capsule Take 1 capsule (36,000 Units total) by mouth 3 (three) times daily with meals. 03/14/17  Yes Rolly Salter, MD  methocarbamol (ROBAXIN) 500 MG tablet Take 1 tablet (500 mg total) by mouth every 8 (eight) hours as needed for muscle spasms. 03/14/17  Yes Rolly Salter, MD  metoCLOPramide (REGLAN) 5 MG tablet Take 5 mg by mouth 4 (four) times daily -  before meals and at bedtime.   Yes [provider]  promethazine (PHENERGAN) 12.5 MG tablet Take 1 tablet (12.5 mg total) by mouth every  6 (six) hours as needed for nausea or vomiting. 04/16/17  Yes Dorothea Ogle, MD  furosemide (LASIX) 40 MG tablet Take 1 tablet (40 mg total) by mouth daily. 03/21/17 05/02/17  Glade Lloyd, MD  loperamide (IMODIUM) 2 MG capsule Take 1 capsule (2 mg total) by mouth 3 (three) times daily before meals. DO NOT TAKE IT FOR THE DAY YOU DONT HAVE A BOWEL MOVEMENT. Patient not taking: Reported on 05/12/2017 04/03/17   Avanell Shackleton, NP-C  oxyCODONE-acetaminophen (PERCOCET) 7.5-325 MG tablet Take 1 tablet by mouth every 6 (six) hours as needed for up to 5 days for severe pain. Patient not taking: Reported on 05/12/2017 05/08/17 05/13/17  Toney Rakes, MD    Family History Family History  Problem Relation Age of Onset  . Diabetes Mother   . Hypertension Father     Social History Social History   Tobacco Use  . Smoking status: Never Smoker  . Smokeless  tobacco: Never Used  Substance Use Topics  . Alcohol use: No    Alcohol/week: 0.0 oz    Frequency: Never  . Drug use: No     Allergies   Peanut-containing drug products and Lactose intolerance (gi)   Review of Systems Review of Systems Ten systems reviewed and are negative for acute change, except as noted in the HPI.    Physical Exam Updated Vital Signs BP 108/82 (BP Location: Right Arm)   Pulse (!) 101   Temp 97.9 F (36.6 C) (Oral)   Resp 16   Ht 5\' 7"  (1.702 m)   Wt 59 kg (130 lb)   SpO2 98%   BMI 20.36 kg/m   Physical Exam  Constitutional: She is oriented to person, place, and time. She appears well-developed and well-nourished. No distress.  Appears uncomfortable. Nontoxic.  HENT:  Head: Normocephalic and atraumatic.  Eyes: Conjunctivae and EOM are normal. No scleral icterus.  Neck: Normal range of motion.  Cardiovascular: Regular rhythm and intact distal pulses.  Baseline tachycardia  Pulmonary/Chest: Effort normal. No stridor. No respiratory distress.  Respirations even and unlabored  Abdominal: Soft. She exhibits no mass. There is tenderness. There is no guarding.  Soft abdomen with TTP in the R mid abdomen and bilateral lower quadrants. No distension, masses, or peritoneal signs.  Genitourinary:  Genitourinary Comments: Dressings taken down. Drains in place. Mild purulent drainage. No surrounding erythema, heat to touch, induration. No bleeding. Appears to be healing appropriately.  Musculoskeletal: Normal range of motion.  Neurological: She is alert and oriented to person, place, and time. She exhibits normal muscle tone. Coordination normal.  Skin: Skin is warm and dry. No rash noted. She is not diaphoretic. No erythema. No pallor.  Psychiatric: She has a normal mood and affect. Her behavior is normal.  Nursing note and vitals reviewed.    ED Treatments / Results  Labs (all labs ordered are listed, but only abnormal results are displayed) Labs  Reviewed  CBC WITH DIFFERENTIAL/PLATELET - Abnormal; Notable for the following components:      Result Value   Hemoglobin 11.6 (*)    HCT 33.3 (*)    All other components within normal limits  COMPREHENSIVE METABOLIC PANEL - Abnormal; Notable for the following components:   Chloride 99 (*)    Glucose, Bld 164 (*)    Albumin 3.4 (*)    All other components within normal limits  URINALYSIS, ROUTINE W REFLEX MICROSCOPIC - Abnormal; Notable for the following components:   Hgb urine dipstick MODERATE (*)  Bacteria, UA RARE (*)    Squamous Epithelial / LPF 0-5 (*)    All other components within normal limits  PREGNANCY, URINE    EKG  EKG Interpretation None       Radiology No results found.  Procedures Procedures (including critical care time)  Medications Ordered in ED Medications  oxyCODONE-acetaminophen (PERCOCET/ROXICET) 5-325 MG per tablet 2 tablet (not administered)  HYDROmorphone (DILAUDID) injection 1 mg (1 mg Intravenous Given 05/12/17 0437)    5:33 AM Patient reports that pain has improved on repeat assessment.  She has no complaints of abdominal pain at present.  Have discussed reassuring laboratory workup which is at baseline.  Patient verbalizes understanding.  She has been advised to continue with primary care follow-up as scheduled today.   Initial Impression / Assessment and Plan / ED Course  I have reviewed the triage vital signs and the nursing notes.  Pertinent labs & imaging results that were available during my care of the patient were reviewed by me and considered in my medical decision making (see chart for details).     27 year old female presents to the emergency department for complaints of intermittent lower abdominal pain.  She has continued to have bowel movements and denies any bloody bowel movements.  No associated nausea or vomiting.  She has been taking ibuprofen and Percocet for pain without relief.  Patient further complaining of burning  bilateral lower extremity pain.  She has been seen previously for similar discomfort and denies any changes to this chronic pain.  No associated fevers, incontinence, numbness or extremity weakness.  Blood work was performed today which is stable compared to baseline.  The patient is afebrile and hemodynamically stable.  She has mild tachycardia at baseline compared to prior evaluations in the ED.  Abscess sites and drains have been examined.  No evidence of secondary infection or cellulitis currently.    Patient has had improvement to her abdominal pain and leg pain with 1 dose of IV Dilaudid.  She has no signs of acute surgical abdomen on exam.  On chart review, the patient has undergone multiple CT scans in the past 2 months.  I do not see any current indication for emergent imaging.  The patient does have follow-up with her primary care doctor today.  I have encouraged that she keep this appointment.  At this point, I feel as though the patient has an issue with pain control which needs to be further addressed by her primary doctor.  I have encouraged the patient to attempt discussion on referral to pain management as I foresee this being an ongoing issue.  Return precautions discussed and provided. Patient discharged in stable condition with no unaddressed concerns.   Final Clinical Impressions(s) / ED Diagnoses   Final diagnoses:  Lower abdominal pain  Pain in both lower extremities    ED Discharge Orders    None       Antony Madura, PA-C 05/12/17 0540    Palumbo, April, MD 05/12/17 [phone]

## 2017-05-13 ENCOUNTER — Encounter (HOSPITAL_COMMUNITY): Payer: Self-pay | Admitting: Emergency Medicine

## 2017-05-13 ENCOUNTER — Ambulatory Visit (INDEPENDENT_AMBULATORY_CARE_PROVIDER_SITE_OTHER): Payer: 59 | Admitting: Family Medicine

## 2017-05-13 ENCOUNTER — Other Ambulatory Visit: Payer: Self-pay

## 2017-05-13 ENCOUNTER — Encounter: Payer: Self-pay | Admitting: Family Medicine

## 2017-05-13 VITALS — BP 110/70 | HR 122 | Temp 98.2°F | Wt 135.8 lb

## 2017-05-13 DIAGNOSIS — K610 Anal abscess: Secondary | ICD-10-CM | POA: Insufficient documentation

## 2017-05-13 DIAGNOSIS — R079 Chest pain, unspecified: Secondary | ICD-10-CM | POA: Insufficient documentation

## 2017-05-13 DIAGNOSIS — F330 Major depressive disorder, recurrent, mild: Secondary | ICD-10-CM | POA: Diagnosis not present

## 2017-05-13 DIAGNOSIS — I10 Essential (primary) hypertension: Secondary | ICD-10-CM

## 2017-05-13 DIAGNOSIS — I152 Hypertension secondary to endocrine disorders: Secondary | ICD-10-CM

## 2017-05-13 DIAGNOSIS — M545 Low back pain: Secondary | ICD-10-CM

## 2017-05-13 DIAGNOSIS — R0602 Shortness of breath: Secondary | ICD-10-CM | POA: Diagnosis not present

## 2017-05-13 DIAGNOSIS — E1065 Type 1 diabetes mellitus with hyperglycemia: Secondary | ICD-10-CM | POA: Diagnosis not present

## 2017-05-13 DIAGNOSIS — E1159 Type 2 diabetes mellitus with other circulatory complications: Secondary | ICD-10-CM | POA: Diagnosis not present

## 2017-05-13 DIAGNOSIS — E1043 Type 1 diabetes mellitus with diabetic autonomic (poly)neuropathy: Secondary | ICD-10-CM | POA: Diagnosis not present

## 2017-05-13 DIAGNOSIS — R000 Tachycardia, unspecified: Secondary | ICD-10-CM

## 2017-05-13 DIAGNOSIS — Z79899 Other long term (current) drug therapy: Secondary | ICD-10-CM | POA: Diagnosis not present

## 2017-05-13 DIAGNOSIS — R0789 Other chest pain: Secondary | ICD-10-CM | POA: Diagnosis not present

## 2017-05-13 MED ORDER — FERROUS SULFATE 325 (65 FE) MG PO TABS: 325.0000 mg | ORAL_TABLET | Freq: Two times a day (BID) | ORAL | 0 refills | Status: DC

## 2017-05-13 MED ORDER — CITALOPRAM HYDROBROMIDE 20 MG PO TABS: 20.0000 mg | ORAL_TABLET | Freq: Every day | ORAL | 0 refills | Status: DC

## 2017-05-13 MED ORDER — FOLIC ACID 1 MG PO TABS: 1.0000 mg | ORAL_TABLET | Freq: Every day | ORAL | 0 refills | Status: DC

## 2017-05-13 MED ORDER — FUROSEMIDE 40 MG PO TABS: 40.0000 mg | ORAL_TABLET | Freq: Every day | ORAL | 0 refills | Status: DC

## 2017-05-13 MED ORDER — PROMETHAZINE HCL 12.5 MG PO TABS: 12.5000 mg | ORAL_TABLET | Freq: Four times a day (QID) | ORAL | 0 refills | Status: DC | PRN

## 2017-05-13 NOTE — Progress Notes (Signed)
   Subjective:    Patient ID: [name], female    DOB: [birth year], 27 y.o.   MRN: [id number]  HPI Chief Complaint  Patient presents with  . back pain    back pain, needs refills on celexa, lomitol, iron,flexeril, folic acid,lasix, robaxin, perocet, creon, phergan she has ran out of all these meds   She is fairly new to me and this practice, this is her second visit.  Presents with a request for refills of several medications including narcotic pain medication. States she is having severe back pain due to abscess of gluteal region. States she has been seen on several occasions in the ED for this problem and was told that she is having MSK pain as a result of the way she is sitting and not from the abscess itself.  She was recently dismissed from this practice due to numerous same day cancellations and ED visits on those days.   States she saw her surgeon last week and they put in a second drain. This was at [facility]. States she did not discuss pain with her Psychologist, sport and exercise.   Dr. Collene Mares is her GI - states she did not follow up with her.  Diabetes is being managed by Dr. [name].   Denies fever, chills, chest pain, palpitations, shortness of breath, abdominal pain, N/V/D, urinary symptoms, LE edema.   Reviewed allergies, medications, past medical, surgical, family, and social history.     Review of Systems Pertinent positives and negatives in the history of present illness.     Objective:   Physical Exam BP 110/70   Pulse (!) 122   Temp 98.2 F (36.8 C) (Oral)   Wt 135 lb 12.8 oz (61.6 kg)   SpO2 99%   BMI 21.27 kg/m   Alert and oriented and in no acute distress. Not otherwise examined.       Assessment & Plan:  Hypertension associated with diabetes ([facility])  Type 1 diabetes mellitus with diabetic autonomic neuropathy (HCC)  Sinus tachycardia  MDD (major depressive disorder), recurrent episode, mild (HCC)  Low back pain, unspecified back pain laterality, unspecified chronicity,  with sciatica presence unspecified  She is not in any acute distress and is not toxic appearing.  She is tearful and anxious appearing after I discussed that I cannot treat her chronic pain. Recommend that she call and follow up with her surgeon regarding low back pain since she has had issues with this only since perirectal abscess I and D. Discussed that frequent ED visits may appear as drug seeking behavior. She verbalized understanding.

## 2017-05-13 NOTE — ED Triage Notes (Addendum)
Patient complaining of right, left, mid chest pain. Patient is stating she feels sob but O2 Sat is 100% on room air, unlabored breathing, and RR-20. Patient states she has a drain in the abscess that is draining that is near her rectum. Patient states that she hurting in that area and it is making her back hurt. Patient states that she think that the pain from her rectum is making everything else hurt.

## 2017-05-14 ENCOUNTER — Emergency Department (HOSPITAL_COMMUNITY): Payer: 59

## 2017-05-14 ENCOUNTER — Telehealth: Payer: Self-pay | Admitting: Family Medicine

## 2017-05-14 ENCOUNTER — Emergency Department (HOSPITAL_COMMUNITY)
Admission: EM | Admit: 2017-05-14 | Discharge: 2017-05-14 | Disposition: A | Discharge status: Home / Self Care | Payer: 59 | Attending: Emergency Medicine | Admitting: Emergency Medicine

## 2017-05-14 DIAGNOSIS — K610 Anal abscess: Secondary | ICD-10-CM

## 2017-05-14 DIAGNOSIS — R079 Chest pain, unspecified: Secondary | ICD-10-CM | POA: Diagnosis not present

## 2017-05-14 DIAGNOSIS — R739 Hyperglycemia, unspecified: Secondary | ICD-10-CM

## 2017-05-14 LAB — BASIC METABOLIC PANEL
Anion gap: 11 (ref 5–15)
BUN: 11 mg/dL (ref 6–20)
CO2: 27 mmol/L (ref 22–32)
Calcium: 10.3 mg/dL (ref 8.9–10.3)
Chloride: 98 mmol/L — ABNORMAL LOW (ref 101–111)
Creatinine, Ser: 0.56 mg/dL (ref 0.44–1.00)
GFR calc Af Amer: >60 mL/min (ref >60)
GFR calc non Af Amer: >60 mL/min (ref >60)
Glucose, Bld: 261 mg/dL — ABNORMAL HIGH (ref 65–99)
Potassium: 3.5 mmol/L (ref 3.5–5.1)
Sodium: 136 mmol/L (ref 135–145)

## 2017-05-14 LAB — CBC
HCT: 34.2 % — ABNORMAL LOW (ref 36.0–46.0)
Hemoglobin: 11.8 g/dL — ABNORMAL LOW (ref 12.0–15.0)
MCH: 28.2 pg (ref 26.0–34.0)
MCHC: 34.5 g/dL (ref 30.0–36.0)
MCV: 81.8 fL (ref 78.0–100.0)
Platelets: 164 10*3/uL (ref 150–400)
RBC: 4.18 MIL/uL (ref 3.87–5.11)
RDW: 11.7 % (ref 11.5–15.5)
WBC: 4.6 10*3/uL (ref 4.0–10.5)

## 2017-05-14 LAB — I-STAT BETA HCG BLOOD, ED (MC, WL, AP ONLY): I-stat hCG, quantitative: <5 m[IU]/mL (ref <5)

## 2017-05-14 LAB — I-STAT TROPONIN, ED: Troponin i, poc: 0 ng/mL (ref 0.00–0.08)

## 2017-05-14 LAB — CBG MONITORING, ED: Glucose-Capillary: 157 mg/dL — ABNORMAL HIGH (ref 65–99)

## 2017-05-14 IMAGING — CR DG CHEST 2V
2 series · 2 of 2 positions shown · non-contrast
Comparison: Radiographs [DATE]

CLINICAL DATA: Mid chest pain.  Weakness.  Nausea.

EXAM:
CHEST  2 VIEW

[w chest pa]
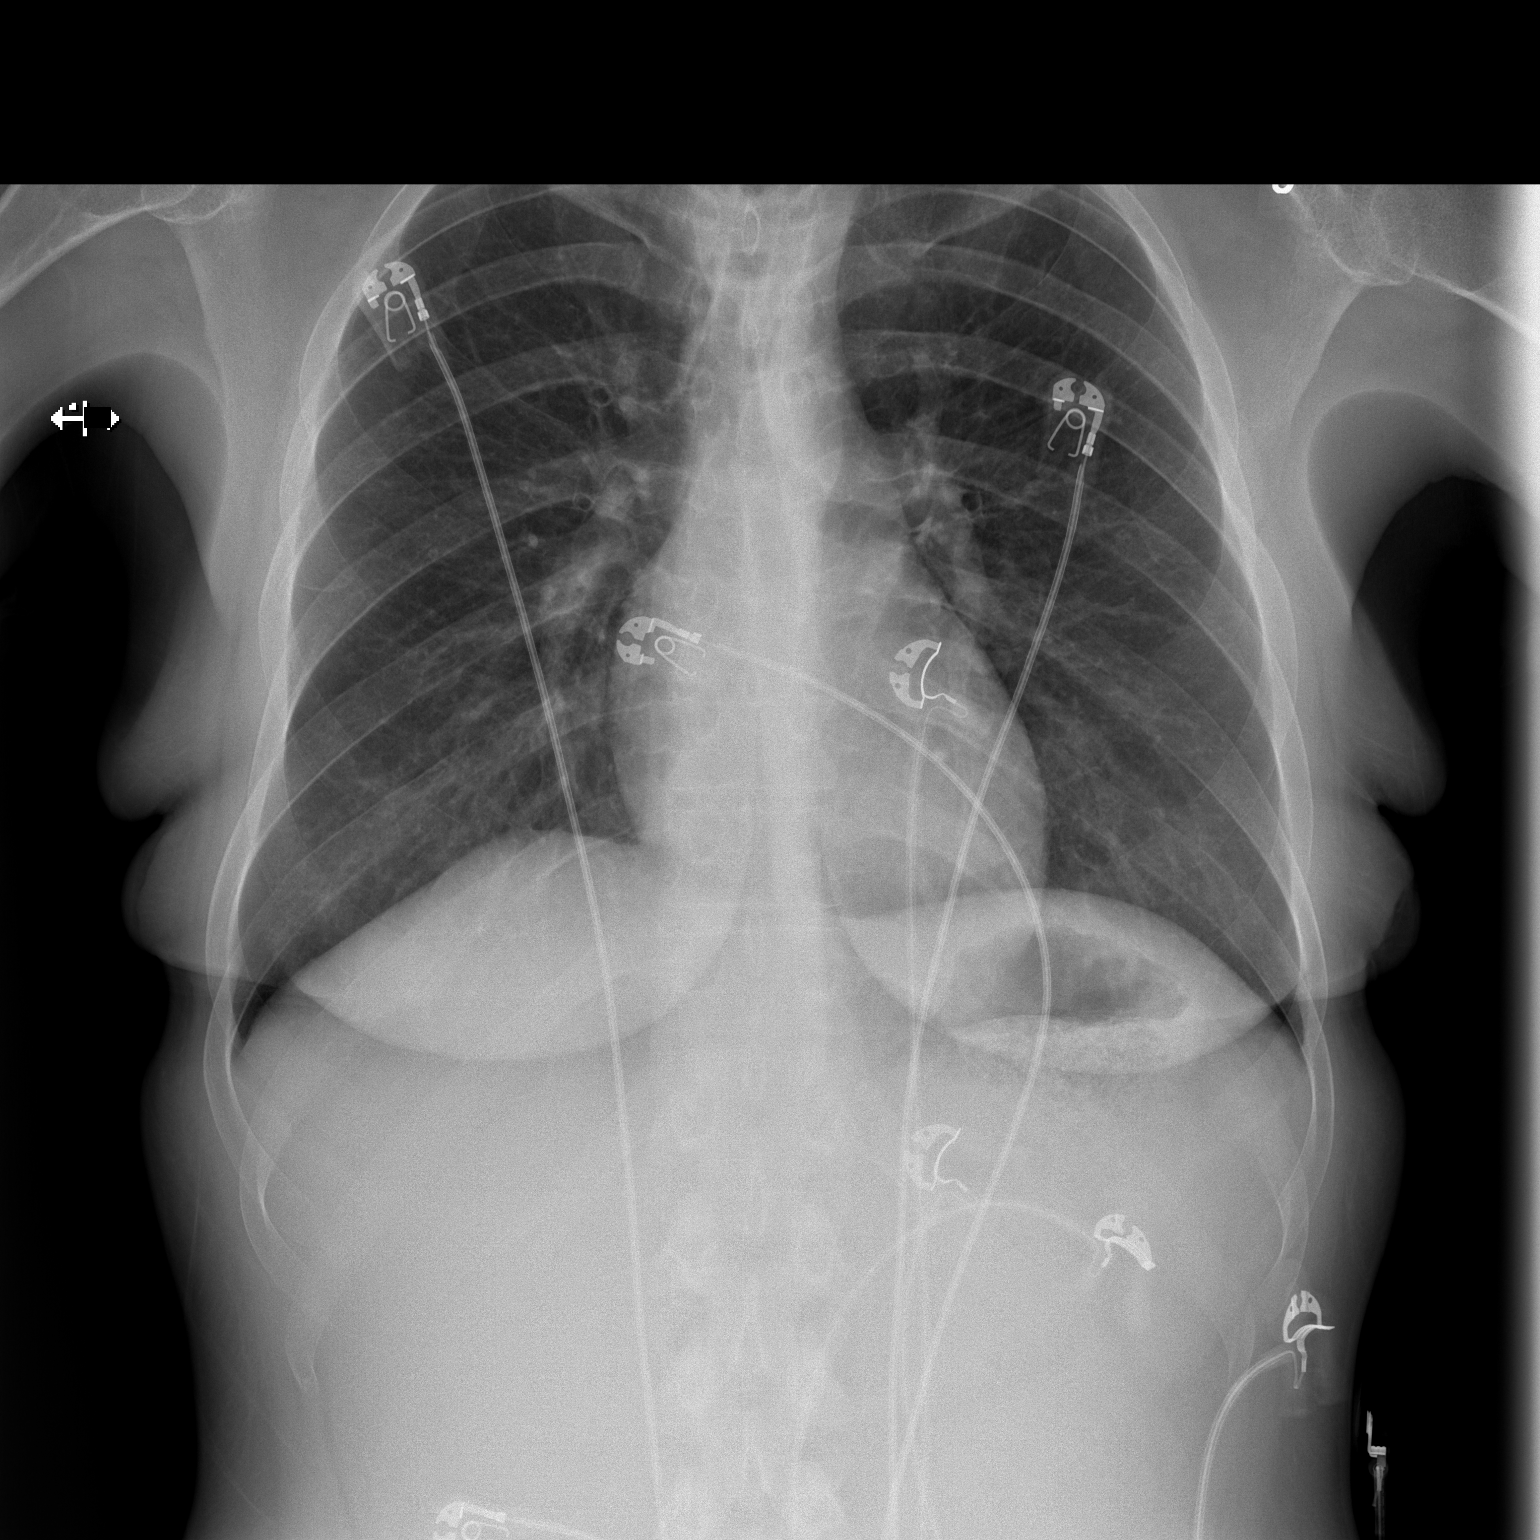

[w chest lat]
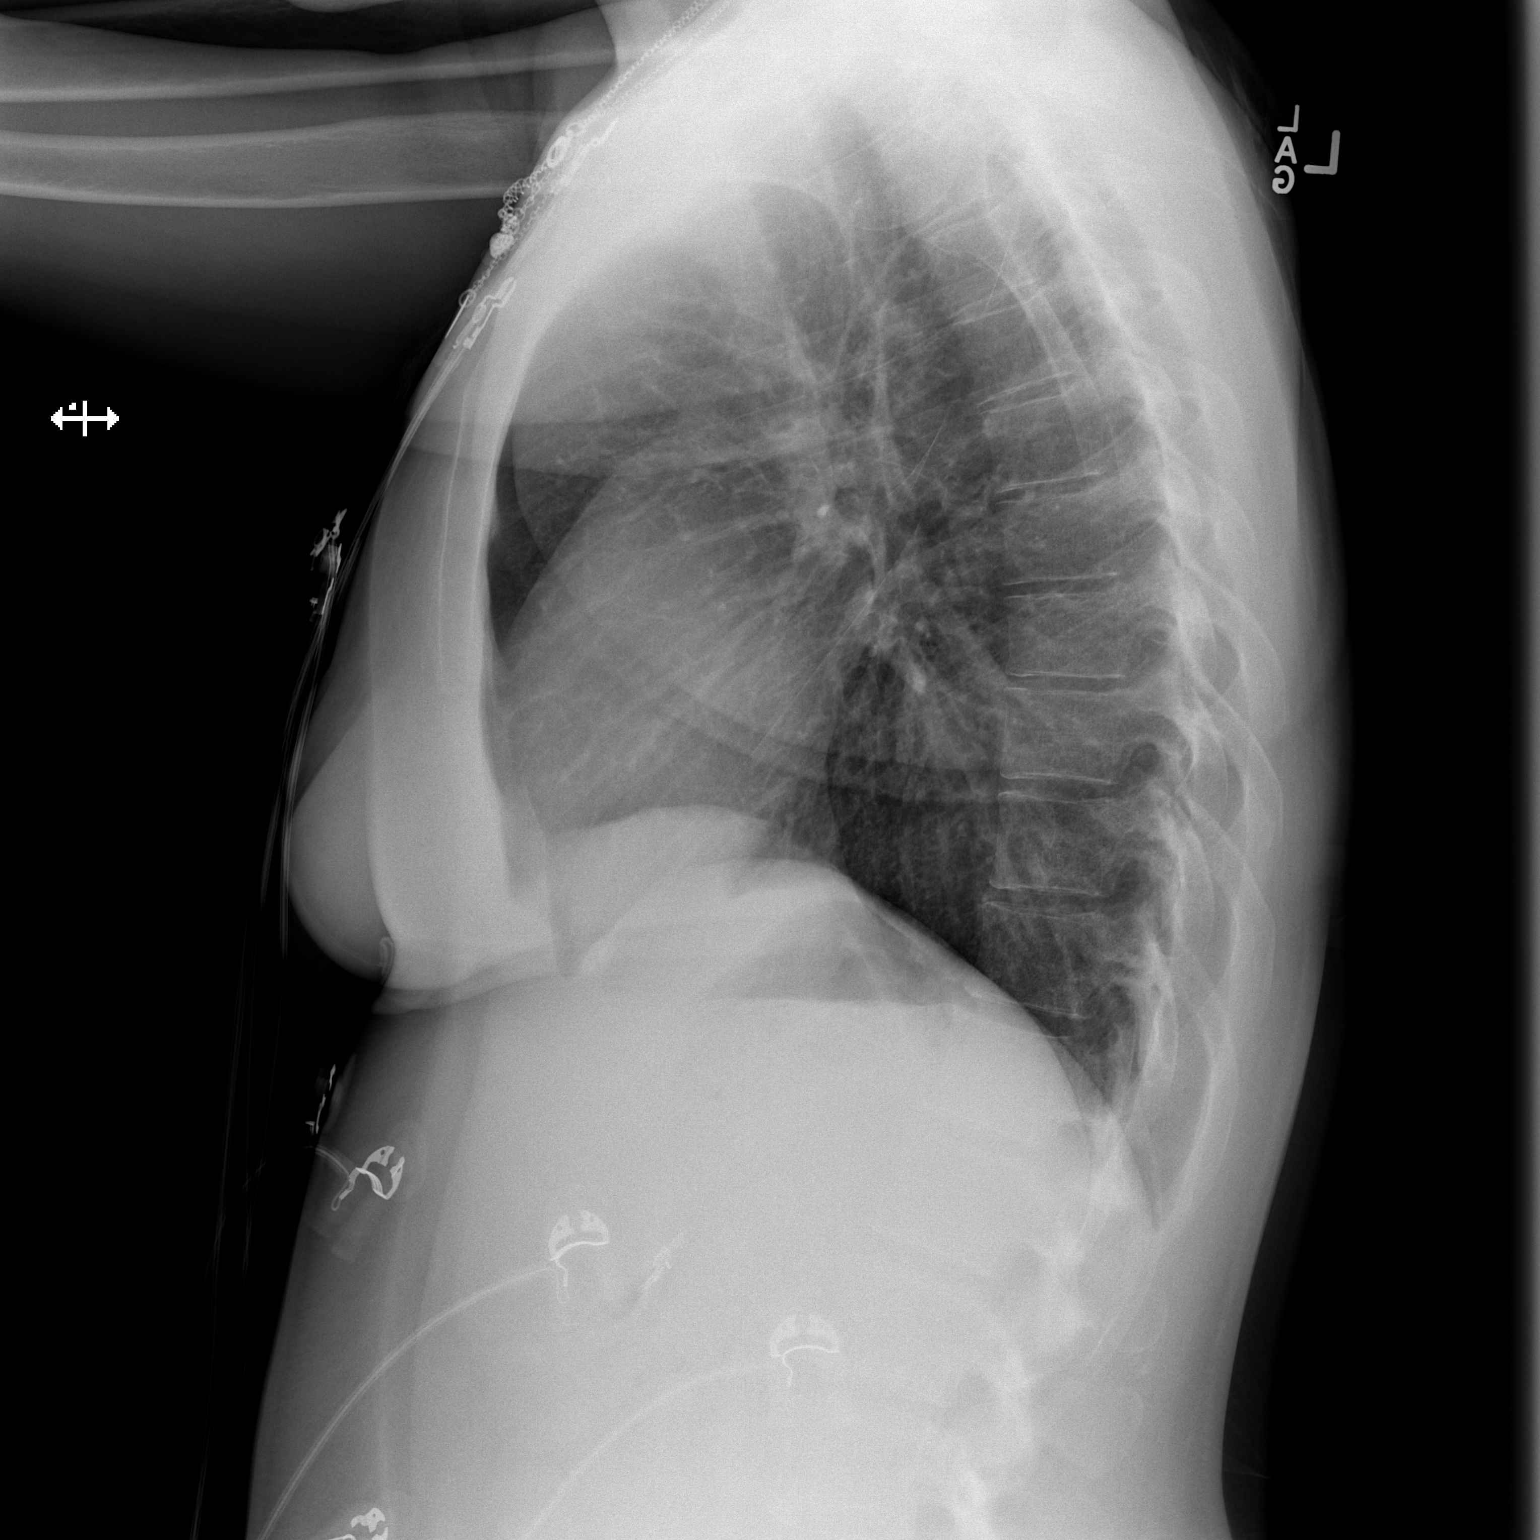

[2 of 2 positions shown; findings below may reference images not displayed]

FINDINGS: The cardiomediastinal contours are normal. The lungs are clear.
Pulmonary vasculature is normal. No consolidation, pleural effusion,
or pneumothorax. No acute osseous abnormalities are seen.
IMPRESSION: Unremarkable radiographs of the chest.

## 2017-05-14 IMAGING — CT CT PELVIS W/ CM
2 of 4 series · 16 of 46 positions shown, 18 images · IV contrast (ISOVUE 300)
Comparison: [DATE]

CLINICAL DATA: Anal rectal abscess. History of diabetes. Prior
irrigation and debridement [DATE].

EXAM:
CT PELVIS WITH CONTRAST
TECHNIQUE: Multidetector CT imaging of the pelvis was performed using the
standard protocol following the bolus administration of intravenous
contrast.
CONTRAST:  100mL [XT] IOPAMIDOL ([XT]) INJECTION 61%

[Series 2: axial st · axial · 0.71mm/px · z∈[-269,-29]mm · 13 of 55 slices shown, 15 images]
[im 4/55  soft-tissue]
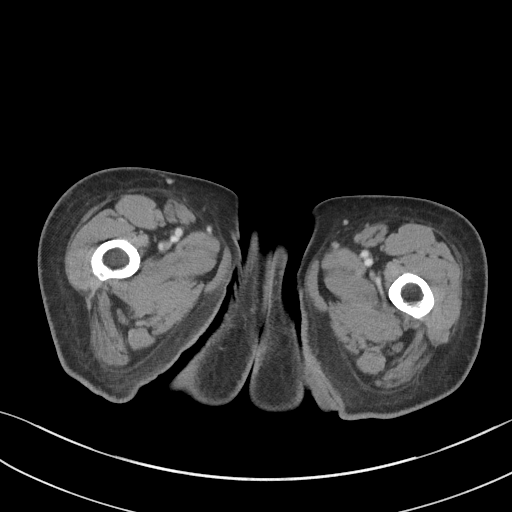
[im 4/55  bone]
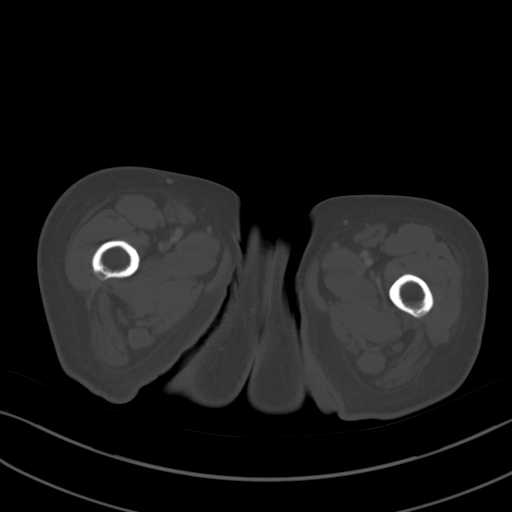
[im 7/55  soft-tissue]
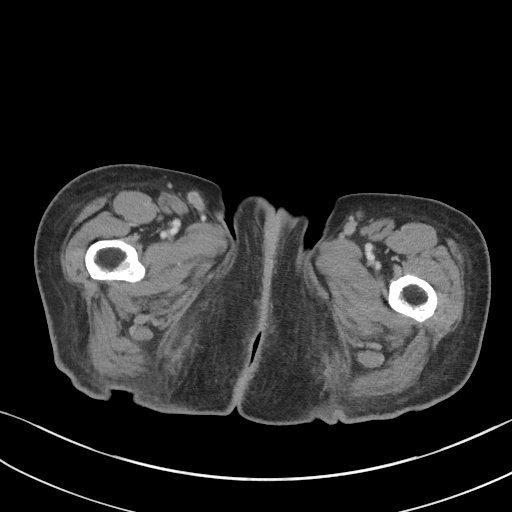
[im 13/55  soft-tissue]
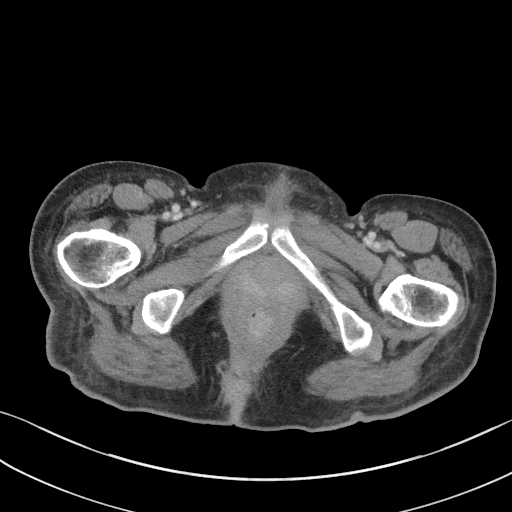
[im 16/55  soft-tissue]
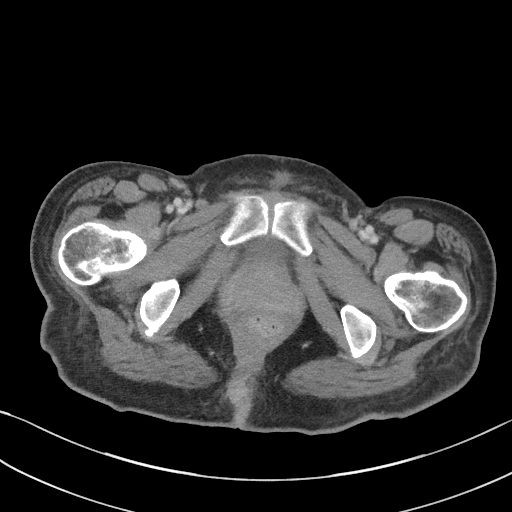
[im 19/55  soft-tissue]
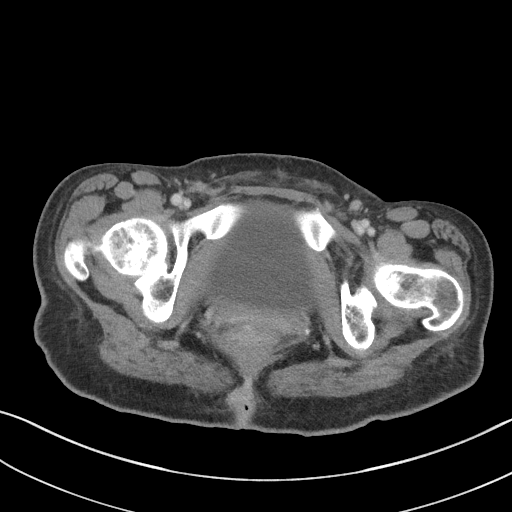
[im 25/55  soft-tissue]
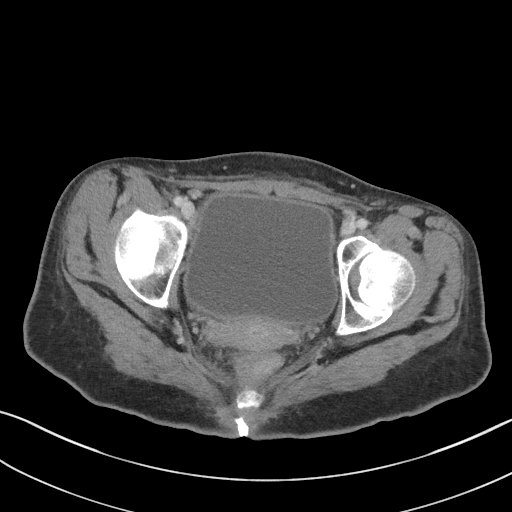
[im 28/55  soft-tissue]
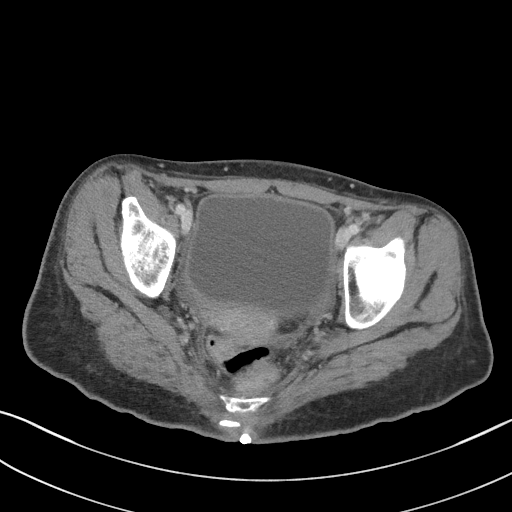
[im 31/55  soft-tissue]
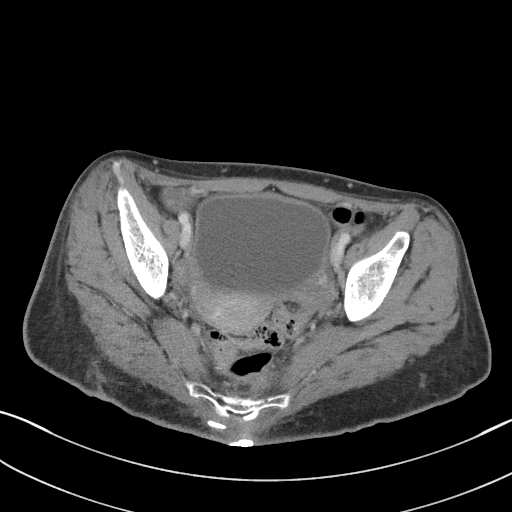
[im 37/55  soft-tissue]
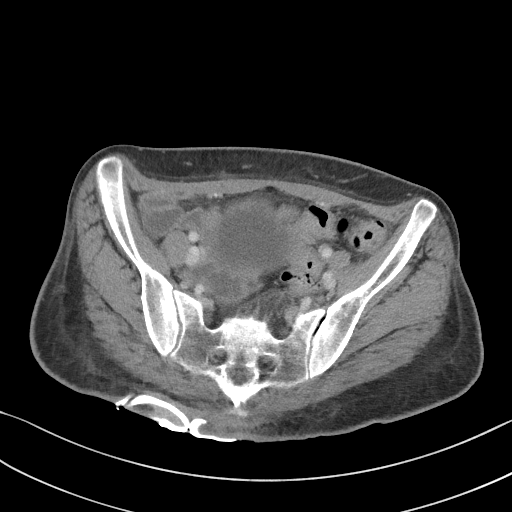
[im 37/55  bone]
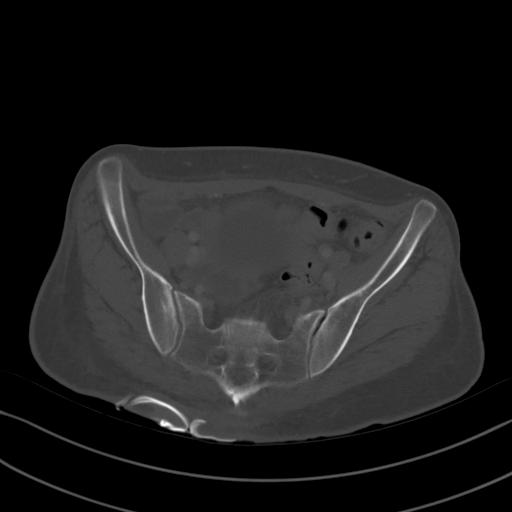
[im 40/55  soft-tissue]
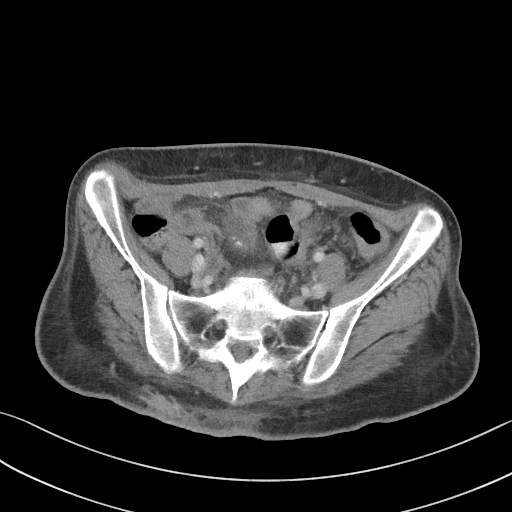
[im 43/55  soft-tissue]
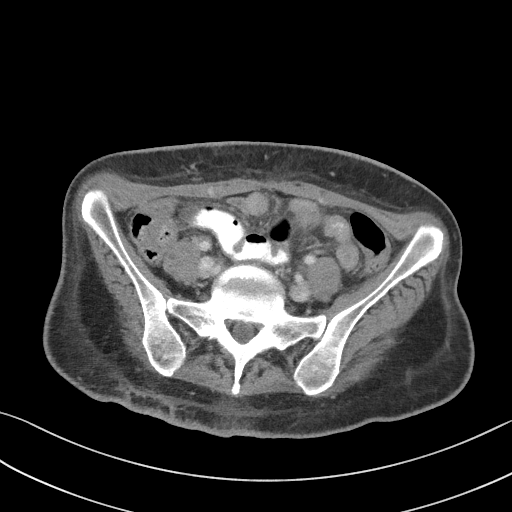
[im 49/55  soft-tissue]
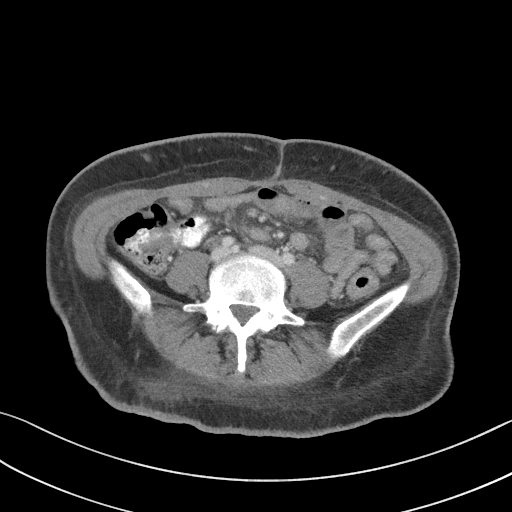
[im 52/55  soft-tissue]
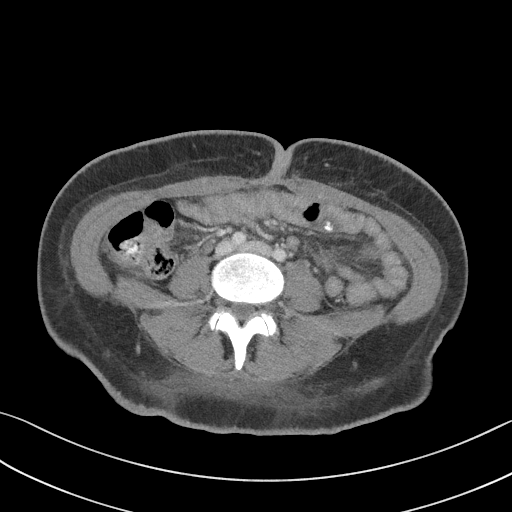

[Series 4: coronal st · coronal · 0.56mm/px · 3 of 93 slices shown]
[im 31/93  soft-tissue]
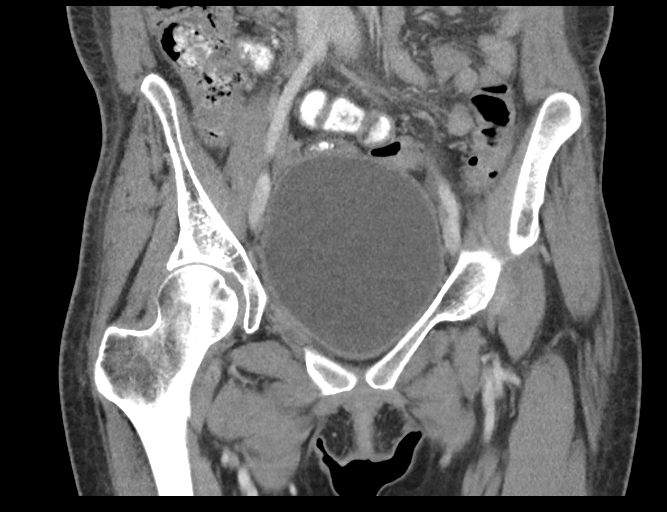
[im 41/93  soft-tissue]
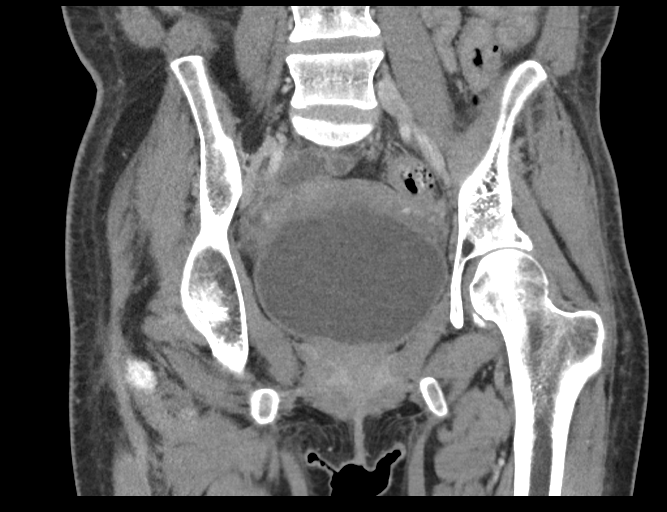
[im 52/93  soft-tissue]
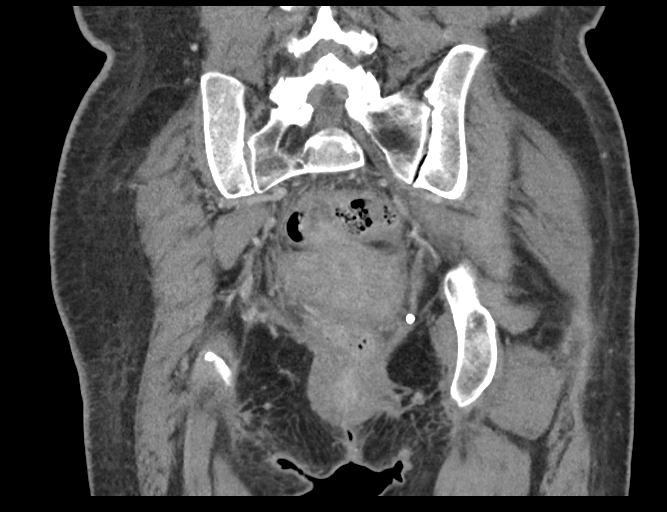

[16 of 46 positions shown; findings below may reference images not displayed]

FINDINGS: Urinary Tract:  normal urinary bladder.  No distal hydroureter.

Bowel: Normal pelvic small bowel. No gross abnormality involving the
rectum.

Vascular/Lymphatic: No pelvic aneurysm. Right external iliac
borderline adenopathy is similar, including at 10 mm on image
34/series 2.

Reproductive:  Normal uterus and adnexa.

Other: No significant free fluid. There are drains within the
subcutaneous tissues superficial to the sacrum and medial right
gluteal musculature, including on image 19/series 2. There is
subcutaneous soft tissue thickening, without well-defined residual
fluid collection. Soft tissue thickening is identified about the
right gluteal cleft, including on image 42/series 2.

Musculoskeletal: No acute osseous abnormality. No gross osseous
destruction.
IMPRESSION: 1. Drains within the subcutaneous tissues superficial to the midline
sacrum and medial right gluteal musculature. No well-defined
residual fluid collection in these areas.
2. More inferiorly, soft tissue thickening along the right gluteal
cleft is suspicious for fistulous communication to the anus. If
indicated, the imaging test of choice to further evaluate would be
dedicated high-resolution pre and post-contrast pelvic MRI.
3. Borderline right external iliac adenopathy, likely reactive.

## 2017-05-14 MED ORDER — CLINDAMYCIN HCL 300 MG PO CAPS: 300.0000 mg | ORAL_CAPSULE | Freq: Three times a day (TID) | ORAL | 0 refills | Status: DC

## 2017-05-14 MED ORDER — HYDROMORPHONE HCL 1 MG/ML IJ SOLN
1.0000 mg | Freq: Once | INTRAMUSCULAR | Status: AC
  Administered 2017-05-14: 1 mg via INTRAVENOUS
  Filled 2017-05-14: qty 1

## 2017-05-14 MED ORDER — IOPAMIDOL (ISOVUE-300) INJECTION 61%
INTRAVENOUS | Status: AC
  Filled 2017-05-14: qty 100

## 2017-05-14 MED ORDER — CLINDAMYCIN HCL 300 MG PO CAPS: 300.0000 mg | ORAL_CAPSULE | Freq: Four times a day (QID) | ORAL | 0 refills | Status: AC

## 2017-05-14 MED ORDER — IOPAMIDOL (ISOVUE-300) INJECTION 61%
100.0000 mL | Freq: Once | INTRAVENOUS | Status: AC | PRN
  Administered 2017-05-14: 100 mL via INTRAVENOUS

## 2017-05-14 MED ORDER — OXYCODONE-ACETAMINOPHEN 5-325 MG PO TABS
1.0000 | ORAL_TABLET | Freq: Once | ORAL | Status: AC
  Administered 2017-05-14: 1 via ORAL
  Filled 2017-05-14: qty 1

## 2017-05-14 MED ORDER — CLINDAMYCIN PHOSPHATE 600 MG/50ML IV SOLN
600.0000 mg | Freq: Once | INTRAVENOUS | Status: AC
  Administered 2017-05-14: 600 mg via INTRAVENOUS
  Filled 2017-05-14: qty 50

## 2017-05-14 MED ORDER — ONDANSETRON HCL 4 MG/2ML IJ SOLN
4.0000 mg | Freq: Once | INTRAMUSCULAR | Status: AC
  Administered 2017-05-14: 4 mg via INTRAVENOUS
  Filled 2017-05-14: qty 2

## 2017-05-14 MED ORDER — SODIUM CHLORIDE 0.9 % IV BOLUS (SEPSIS)
1000.0000 mL | Freq: Once | INTRAVENOUS | Status: AC
  Administered 2017-05-14: 1000 mL via INTRAVENOUS

## 2017-05-14 MED ORDER — OXYCODONE-ACETAMINOPHEN 5-325 MG PO TABS: 1.0000 | ORAL_TABLET | Freq: Three times a day (TID) | ORAL | 0 refills | Status: DC | PRN

## 2017-05-14 NOTE — Telephone Encounter (Signed)
Drug interaction with Metoclopramide with the promethazine prescribed. Please advise pharmacy [facility] [phone]

## 2017-05-14 NOTE — Discharge Instructions (Signed)
Take 300 mg (1 tablet) clindamycin every 6 hours for the next 2 weeks.  You were given a dose of around 1 PM in the emergency department so please do not take your next dose until after 7 PM.  You may need to be on antibiotics longer than 2 weeks, but these medications can be written by your surgeon, primary care provider, or Dr. Loreta Ave.  Please call and schedule a wound recheck with primary care in the next 2-3 days to ensure that her symptoms are improving.  Mild to moderate pain should be controlled with ibuprofen and Tylenol.  Take 600 mg of ibuprofen with food every 8 hours for pain control or 650 mg of Tylenol every 6 hours.  For severe pain, take 1 tablet of Percocet every 8 hours as needed for pain.  Please note, each tablet of Percocet contains 325 mg of Tylenol.  Do not take more than 4000 mg of Tylenol in a 24-hour.  Percocet is a narcotic and should not be used if you have to work or drive because it can  cause you to be impaired.  It is also a medication that you can get addicted to.  Please only use when necessary.  Wound Management: right buttock open with penrose drain  Keep incision site clean and dry follow surgery  Wound currently packed with Penrose drain and covered with dry large gauze.   Change gauze pad 2-3 times daily  May shower normally, but remove gauze dressing over wound before doing so, dry wound well after showering and replace dressing  Perform Sitzs bathes 2-3 times daily. See below for how to perform:  Clean bathtub  Fill with warm soapy water to level that will come to hips and mid thigh when sitting in tub  Remove packing from wound and sit in tub with warm soapy water x 15 minutes  Stand up and rinse off bottom half of body to cleanse away soap suds  Dry area well and replace packing  It is important to maintain good control of your blood sugar to help your wound heal.  Please make sure that you are checking at home as directed.  If you develop new or  worsening symptoms, including if the swelling and drainage from the wounds that is thick and mucus-like worsens after you have been taking clindamycin for 72 hours, fever despite taking Tylenol, or other new concerning symptoms.

## 2017-05-14 NOTE — Telephone Encounter (Signed)
Spoke to pharmacy and they will cancel medicine

## 2017-05-14 NOTE — ED Notes (Signed)
Patient transported to CT 

## 2017-05-14 NOTE — Telephone Encounter (Signed)
Cancel the promethazine then and have her follow up with her GI.

## 2017-05-14 NOTE — Progress Notes (Signed)
Inpatient Diabetes Program Recommendations  AACE/ADA: New Consensus Statement on Inpatient Glycemic Control (2015)  Target Ranges:  Prepandial:   less than 140 mg/dL      Peak postprandial:   less than 180 mg/dL (1-2 hours)      Critically ill patients:  140 - 180 mg/dL   Lab Results  Component Value Date   GLUCAP 157 (H) 05/14/2017   HGBA1C 5.7 (H) 04/10/2017    Review of Glycemic Control  Diabetes history:DM1 Outpatient Diabetes medications: Lantus 18 units QD, Novolog 0-9 units tidwc Current orders for Inpatient glycemic control: None  Endo - [facility]  Inpatient Diabetes Program Recommendations:     Lantus 18 units Q24H Novolog 0-9 units tidwc and hs  Pt is Type 1 and needs basal insulin.  Continue to follow if admitted.   Thank you. Ailene Ards, RD, LDN, CDE Inpatient Diabetes Coordinator [phone]

## 2017-05-14 NOTE — ED Provider Notes (Signed)
[facility]-EMERGENCY DEPT Provider Note   CSN: [id number] Arrival date & time: 05/13/17  2229     History   Chief Complaint Chief Complaint  Patient presents with  . Abscess  . Chest Pain    HPI [name] is a 27 y.o. female with a history of Crohn's disease, uncontrolled DM Type 1, hypertension, gastroparesis, perirectal abscess with two primrose drains in place (most recent surgical intervention was 04/22/2017, first I&D was on 12/2) who presents with multiple complaints.  She reports that she was seen recently in the ED and diagnosed with a back sprain because she has been her weight on one side when she sits for the last 3 months because of an abscess on her buttocks.  She also endorses right-sided chest pressure with associated dyspnea that began yesterday morning.  She states that in the past she has had chest pain that presents when she has uncontrolled pain elsewhere.  She denies cough, palpitations, lower extremity swelling, confusion, HA, lightheadedness, dizziness, visual changes, numbness, weakness, abdominal pain, or N/V.  She also endorses purulent drainage from the abscess on her buttocks for the last few days with worsening pain. She has also had diarrhea. She denies fever, chills, melena, or hematochezia.  She has treated her symptoms with home Percocet and ibuprofen with no improvement.   The history is provided by the patient. No language interpreter was used.    Past Medical History:  Diagnosis Date  . Anxiety   . Crohn's disease (HCC)   . Diabetes type 1, uncontrolled (HCC) 11/14/2011   Since age 20  . Fibromyalgia   . Gastroparesis   . Hypertension   . Infection    UTI April 2016    Patient Active Problem List   Diagnosis Date Noted  . Intractable nausea and vomiting 04/11/2017  . Nausea and vomiting 04/10/2017  . Essential hypertension   . Colitis   . Crohn's disease (HCC) 04/03/2017  . Abscess and cellulitis of gluteal  region 03/24/2017  . Cellulitis and abscess of buttock 03/23/2017  . Malnutrition of moderate degree 03/19/2017  . Anasarca 03/16/2017  . 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 Diarrhea 02/27/2017  . Lower GI bleed 02/27/2017  . DKA (diabetic ketoacidosis) (HCC) 02/26/2017  . Acute metabolic encephalopathy   . DKA, type 1 (HCC) 06/11/2013  . Orthostatic hypotension dysautonomic syndrome (HCC) 04/28/2013  . Body aches 04/28/2013  . Orthostatic hypotension 04/28/2013  . Diabetes mellitus type 1 (HCC) 04/28/2013  . Protein-calorie malnutrition, severe (HCC) 04/28/2013  . Noncompliance with diabetes treatment 09/01/2012  . Dyspnea 02/28/2012  . Sinus tachycardia 02/28/2012  . Hyperglycemia without ketosis 02/28/2012  . Chest pain 12/04/2011  . Diabetes type 1, uncontrolled (HCC) 11/14/2011  . Hypertension associated with diabetes (HCC) 11/14/2011  . Gastroparesis 11/14/2011  . Hyponatremia 09/19/2011  . Abdominal pain 09/18/2011  . Metabolic alkalosis 09/18/2011  . Diabetic hyperosmolar non-ketotic state (HCC) 09/18/2011  . DIAB W/UNSPEC COMP TYPE I [JUV TYPE] UNCNTRL 06/04/2010  . CONSTIPATION 01/01/2010  . RECTAL PAIN 01/01/2010    Past Surgical History:  Procedure Laterality Date  . ANKLE SURGERY    . CHOLECYSTECTOMY  11/15/2011   Procedure: LAPAROSCOPIC CHOLECYSTECTOMY WITH INTRAOPERATIVE CHOLANGIOGRAM;  Surgeon: [name], MD;  Location: [facility];  Service: General;  Laterality: N/A;  . ESOPHAGOGASTRODUODENOSCOPY  12/03/2011   Procedure: ESOPHAGOGASTRODUODENOSCOPY (EGD);  Surgeon: Theda Belfast, MD;  Location: [facility];  Service: Endoscopy;  Laterality: N/A;  .  FLEXIBLE SIGMOIDOSCOPY N/A 03/10/2017   Procedure: FLEXIBLE SIGMOIDOSCOPY;  Surgeon: Jeani Hawking, MD;  Location: [facility];  Service: Endoscopy;  Laterality: N/A;  . INCISION AND DRAINAGE PERIRECTAL ABSCESS N/A 03/01/2017   Procedure: IRRIGATION AND DEBRIDEMENT  PERIRECTAL ABSCESS;  Surgeon: Ovidio Kin, MD;  Location: [facility];  Service: General;  Laterality: N/A;  . IRRIGATION AND DEBRIDEMENT BUTTOCKS N/A 03/23/2017   Procedure: IRRIGATION AND DEBRIDEMENT BUTTOCKS, SETON PLACEMENT;  Surgeon: Romie Levee, MD;  Location: [facility];  Service: General;  Laterality: N/A;  . LAPAROSCOPY  11/23/2011   Procedure: LAPAROSCOPY DIAGNOSTIC;  Surgeon: Mariella Saa, MD;  Location: [facility];  Service: General;  Laterality: N/A;    OB History    Gravida Para Term Preterm AB Living   1 1   1   1    SAB TAB Ectopic Multiple Live Births         0 1       Home Medications    Prior to Admission medications   Medication Sig Start Date End Date Taking? Authorizing Provider  citalopram (CELEXA) 20 MG tablet Take 1 tablet (20 mg total) by mouth daily. 05/13/17  Yes Henson, Vickie L, NP-C  cyclobenzaprine (FLEXERIL) 10 MG tablet Take 1 tablet (10 mg total) by mouth 2 (two) times daily as needed for muscle spasms. 05/02/17  Yes Kellie Shropshire, PA-C  diphenoxylate-atropine (LOMOTIL) 2.5-0.025 MG tablet Take 2 tablets by mouth 4 (four) times daily. 04/08/17  Yes Henson, Vickie L, NP-C  EPINEPHrine (EPIPEN 2-PAK) 0.3 mg/0.3 mL IJ SOAJ injection Inject 0.3 mLs (0.3 mg total) into the muscle as needed (allergic reaction). 03/21/17  Yes Glade Lloyd, MD  ferrous sulfate 325 (65 FE) MG tablet Take 1 tablet (325 mg total) by mouth 2 (two) times daily with a meal. 05/13/17  Yes Henson, Vickie L, NP-C  folic acid (FOLVITE) 1 MG tablet Take 1 tablet (1 mg total) by mouth daily. 05/13/17  Yes Henson, Vickie L, NP-C  furosemide (LASIX) 40 MG tablet Take 1 tablet (40 mg total) by mouth daily. 05/13/17 06/12/17 Yes Henson, Vickie L, NP-C  gabapentin (NEURONTIN) 300 MG capsule Take 1 capsule (300 mg total) by mouth 2 (two) times daily. 04/03/17  Yes Henson, Vickie L, NP-C  glucose 4 GM chewable tablet Chew 1 tablet (4 g total) by mouth as needed for low blood sugar. 08/10/14  Yes Denney,  Rachelle A, CNM  ibuprofen (ADVIL,MOTRIN) 800 MG tablet Take 800 mg by mouth 3 (three) times daily. 04/27/17  Yes [provider]  insulin lispro (HUMALOG) 100 UNIT/ML injection Inject 0.02 mLs (2 Units total) into the skin 3 (three) times daily with meals. Patient taking differently: Inject 1-9 Units into the skin 3 (three) times daily with meals. Sliding scale 04/09/17  Yes Romero Belling, MD  LANTUS 100 UNIT/ML injection Inject 0.18 mLs (18 Units total) into the skin every morning. 05/07/17  Yes Romero Belling, MD  lipase/protease/amylase (CREON) 36000 UNITS CPEP capsule Take 1 capsule (36,000 Units total) by mouth 3 (three) times daily with meals. 03/14/17  Yes Rolly Salter, MD  loperamide (IMODIUM) 2 MG capsule Take 1 capsule (2 mg total) by mouth 3 (three) times daily before meals. DO NOT TAKE IT FOR THE DAY YOU DONT HAVE A BOWEL MOVEMENT. 04/03/17  Yes Henson, Vickie L, NP-C  methocarbamol (ROBAXIN) 500 MG tablet Take 1 tablet (500 mg total) by mouth every 8 (eight) hours as needed for muscle spasms. 03/14/17  Yes Rolly Salter, MD  metoCLOPramide (REGLAN) 5 MG tablet Take 5 mg by mouth 4 (four) times daily -  before meals and at bedtime.   Yes [provider]  promethazine (PHENERGAN) 12.5 MG tablet Take 1 tablet (12.5 mg total) by mouth every 6 (six) hours as needed for nausea or vomiting. 05/13/17  Yes Henson, Vickie L, NP-C  cholestyramine light (PREVALITE) 4 g packet Take 1 packet (4 g total) by mouth 3 (three) times daily. Patient not taking: Reported on 05/13/2017 03/14/17   Rolly Salter, MD  clindamycin (CLEOCIN) 300 MG capsule Take 1 capsule (300 mg total) by mouth 4 (four) times daily for 14 days. 05/14/17 05/28/17  Latroy Gaymon A, PA-C  oxyCODONE-acetaminophen (PERCOCET/ROXICET) 5-325 MG tablet Take 1 tablet by mouth every 8 (eight) hours as needed for severe pain. 05/14/17   Teara Duerksen, Coral Else, PA-C    Family History Family History  Problem Relation Age of Onset  .  Diabetes Mother   . Hypertension Father     Social History Social History   Tobacco Use  . Smoking status: Never Smoker  . Smokeless tobacco: Never Used  Substance Use Topics  . Alcohol use: No    Alcohol/week: 0.0 oz    Frequency: Never  . Drug use: No     Allergies   Peanut-containing drug products and Lactose intolerance (gi)   Review of Systems Review of Systems  Constitutional: Negative for activity change, chills and fever.  HENT: Negative for congestion.   Respiratory: Positive for shortness of breath.   Cardiovascular: Positive for chest pain.  Gastrointestinal: Positive for diarrhea. Negative for abdominal pain, anal bleeding, blood in stool, constipation, nausea and vomiting.  Genitourinary: Negative for dysuria.  Musculoskeletal: Positive for back pain. Negative for neck pain and neck stiffness.  Skin: Negative for rash.  Allergic/Immunologic: Positive for immunocompromised state.  Neurological: Negative for dizziness, syncope, weakness, numbness and headaches.  Psychiatric/Behavioral: Negative for confusion.    Physical Exam Updated Vital Signs BP (!) 148/113 (BP Location: Right Arm)   Pulse (!) 102   Temp 99 F (37.2 C) (Oral)   Resp 18   Ht 5\' 7"  (1.702 m)   Wt 61.2 kg (135 lb)   SpO2 100%   BMI 21.14 kg/m   Physical Exam  Constitutional: No distress.  HENT:  Head: Normocephalic.  Eyes: Conjunctivae are normal.  Neck: Neck supple.  Cardiovascular: Regular rhythm. Tachycardia present. Exam reveals no gallop and no friction rub.  No murmur heard. Pulmonary/Chest: Effort normal. No stridor. No respiratory distress. She has no decreased breath sounds. She has no wheezes. She has no rhonchi. She has no rales. She exhibits no tenderness.  Abdominal: Soft. She exhibits no distension.  Musculoskeletal:  Penrose drain is in place.  Foul-smelling purulent discharge is noted on the dressing and is expressed from the wound.  There is a large area of  warmth and induration surrounding the drain in there perianal area.  No overlying erythema.  Neurological: She is alert.  Skin: Skin is warm. No rash noted.  Psychiatric: Her behavior is normal.  Nursing note and vitals reviewed.  ED Treatments / Results  Labs (all labs ordered are listed, but only abnormal results are displayed) Labs Reviewed  BASIC METABOLIC PANEL - Abnormal; Notable for the following components:      Result Value   Chloride 98 (*)    Glucose, Bld 261 (*)    All other components within normal limits  CBC - Abnormal; Notable for the following components:  Hemoglobin 11.8 (*)    HCT 34.2 (*)    All other components within normal limits  CBG MONITORING, ED - Abnormal; Notable for the following components:   Glucose-Capillary 157 (*)    All other components within normal limits  I-STAT TROPONIN, ED  I-STAT BETA HCG BLOOD, ED ([facility], [facility], [facility] ONLY)    EKG  EKG Interpretation  Date/Time:  Wednesday May 13 2017 23:15:12 EST Ventricular Rate:  119 PR Interval:    QRS Duration: 79 QT Interval:  324 QTC Calculation: 456 R Axis:   84 Text Interpretation:  Sinus tachycardia Baseline wander in lead(s) V4 V5 V6 When compared with ECG of 04/12/2017, QT has shortened Confirmed by Dione Booze ([id number]) on 05/14/2017 3:47:07 AM Also confirmed by Dione Booze ([id number]), editor Elita Quick [phone])  on 05/14/2017 7:27:25 AM       Radiology Dg Chest 2 View  Result Date: 05/14/2017 CLINICAL DATA:  Mid chest pain.  Weakness.  Nausea. EXAM: CHEST  2 VIEW COMPARISON:  Radiographs 04/06/2017 FINDINGS: The cardiomediastinal contours are normal. The lungs are clear. Pulmonary vasculature is normal. No consolidation, pleural effusion, or pneumothorax. No acute osseous abnormalities are seen. IMPRESSION: Unremarkable radiographs of the chest. Electronically Signed   By: Rubye Oaks M.D.   On: 05/14/2017 00:23   Ct Pelvis W Contrast  Result Date: 05/14/2017 CLINICAL DATA:   Anal rectal abscess. History of diabetes. Prior irrigation and debridement 03/23/2017. EXAM: CT PELVIS WITH CONTRAST TECHNIQUE: Multidetector CT imaging of the pelvis was performed using the standard protocol following the bolus administration of intravenous contrast. CONTRAST:  ISOVUE-300 IOPAMIDOL (ISOVUE-300) INJECTION 61% COMPARISON:  04/10/2017 FINDINGS: Urinary Tract:  normal urinary bladder.  No distal hydroureter. Bowel: Normal pelvic small bowel. No gross abnormality involving the rectum. Vascular/Lymphatic: No pelvic aneurysm. Right external iliac borderline adenopathy is similar, including at 10 mm on image 34/series 2. Reproductive:  Normal uterus and adnexa. Other: No significant free fluid. There are drains within the subcutaneous tissues superficial to the sacrum and medial right gluteal musculature, including on image 19/series 2. There is subcutaneous soft tissue thickening, without well-defined residual fluid collection. Soft tissue thickening is identified about the right gluteal cleft, including on image 42/series 2. Musculoskeletal: No acute osseous abnormality. No gross osseous destruction. IMPRESSION: 1. Drains within the subcutaneous tissues superficial to the midline sacrum and medial right gluteal musculature. No well-defined residual fluid collection in these areas. 2. More inferiorly, soft tissue thickening along the right gluteal cleft is suspicious for fistulous communication to the anus. If indicated, the imaging test of choice to further evaluate would be dedicated high-resolution pre and post-contrast pelvic MRI. 3. Borderline right external iliac adenopathy, likely reactive. Electronically Signed   By: Jeronimo Greaves M.D.   On: 05/14/2017 09:55    Procedures Procedures (including critical care time)  Medications Ordered in ED Medications  iopamidol (ISOVUE-300) 61 % injection (not administered)  oxyCODONE-acetaminophen (PERCOCET/ROXICET) 5-325 MG per tablet 1 tablet (1  tablet Oral Given 05/14/17 0748)  sodium chloride 0.9 % bolus 1,000 mL (0 mLs Intravenous Stopped 05/14/17 1453)  iopamidol (ISOVUE-300) 61 % injection 100 mL (100 mLs Intravenous Contrast Given 05/14/17 0928)  HYDROmorphone (DILAUDID) injection 1 mg (1 mg Intravenous Given 05/14/17 1039)  ondansetron (ZOFRAN) injection 4 mg (4 mg Intravenous Given 05/14/17 1038)  clindamycin (CLEOCIN) IVPB 600 mg (0 mg Intravenous Stopped 05/14/17 1446)  HYDROmorphone (DILAUDID) injection 1 mg (1 mg Intravenous Given 05/14/17 1354)     Initial Impression / Assessment  and Plan / ED Course  I have reviewed the triage vital signs and the nursing notes.  Pertinent labs & imaging results that were available during my care of the patient were reviewed by me and considered in my medical decision making (see chart for details).     27 year old female with a history of Crohn's disease, uncontrolled DM Type 1, hypertension, gastroparesis, perirectal abscess with two primrose drains in place (most recent surgical intervention was 04/22/2017, first I&D was on 12/2) who presents with multiple complaints including chest pain and worsening drainage, pain, and edema to her perirectal abscess.  No fever or chills.  She is tachycardic in the 100s, which appears to be the patient's baseline after reviewing her medical record.  She is otherwise hemodynamically stable troponin is negative.  No acute changes on EKG.  Chest x-ray is unremarkable.  Doubt ACS, pneumonia, tamponade, or pulmonary etiology.   On physical exam, there is a large area of induration and warmth surrounding the 2 Penrose drains.  Purulent material is actively expressed from the wound.  The wound was also evaluated by Dr. Rush Landmark, attending physician.  CT pelvis with soft tissue thickening along the right gluteal cleft suspicious for fistulous communication to the anus.  MRI was recommended if further evaluation was needed.  Consulted general surgery and spoke with PA  Marlyne Beards.  Dr. Daphine Deutscher will review the images and come evaluate the patient in the ED. After seeing the patient, Dr. Daphine Deutscher recommended placing the patient back on clindamycin since she was previously improving.  He has a low suspicion for fistula at this time.  She may need several weeks of antibiotics.  He also recommends discussing with GI about placing the patient on a biologic for her Crohn's disease.   I had a lengthy conversation with the patient.  She has been seen in the ED 12 times and several of the last visits have been in regards to pain control.  She has also been seen at both our hospital system and [facility].  Discussed with the patient that it is important that she has a core team of providers that follow her care to ensure that she is getting better with a regimen as opposed to going to multiple physicians and emergency departments.  Also discussed concerns with recurrent prescriptions for narcotic pain medication. A 76-month prescription history query was performed using the [place] CSRS prior to discharge.  She has received 109 tablets of pain medication since December 15.  We had a long discussion about the long-term effects of narcotics, the increased risk for addiction, and poor outcomes.  Both she and her significant other in the room were very receptive to this conversation.  However, given that she has an acutely worsening problem, I will send her home with a short course of Percocet for pain control.  Also recommended a donut pillow to help reduce her pain while sitting.   Strict return precautions were given.  She is currently in no acute distress.  The patient is safe for discharge to home at this time.   Final Clinical Impressions(s) / ED Diagnoses   Final diagnoses:  Perianal abscess  Hyperglycemia    ED Discharge Orders        Ordered    clindamycin (CLEOCIN) 300 MG capsule  3 times daily,   Status:  Discontinued     05/14/17 1416    oxyCODONE-acetaminophen  (PERCOCET/ROXICET) 5-325 MG tablet  Every 8 hours PRN     05/14/17  1417    clindamycin (CLEOCIN) 300 MG capsule  4 times daily     05/14/17 1419       McDonaldCoral Else, PA-C 05/14/17 1703    Tegeler, Canary Brim, MD 05/15/17 [phone]

## 2017-05-15 ENCOUNTER — Other Ambulatory Visit: Payer: Self-pay | Admitting: Family Medicine

## 2017-05-15 ENCOUNTER — Telehealth: Payer: Self-pay | Admitting: Family Medicine

## 2017-05-15 NOTE — Telephone Encounter (Signed)
[name],  I refilled phengeran when pt was her on 2/13 like you asked but not lomotil. You did not want me to refill that correct? Pt is asking for a refill on both meds

## 2017-05-15 NOTE — Telephone Encounter (Signed)
Pt called and is requesting some flexeril states you and her discussed this the other day  Pt uses [facility], [place]. Pt can be reached at [phone]

## 2017-05-17 NOTE — Telephone Encounter (Signed)
Correct. There was an interaction issue with the Phenergan and other medication from what I recall. We can give her a 30 day supply of Lomotil and then have her follow up with her GI for this.

## 2017-05-17 NOTE — Telephone Encounter (Signed)
Ok to give her 30 day supply of flexeril.

## 2017-05-18 ENCOUNTER — Other Ambulatory Visit: Payer: Self-pay | Admitting: Family Medicine

## 2017-05-18 MED ORDER — CYCLOBENZAPRINE HCL 10 MG PO TABS: 10.0000 mg | ORAL_TABLET | Freq: Two times a day (BID) | ORAL | 0 refills | Status: DC | PRN

## 2017-05-18 MED ORDER — DIPHENOXYLATE-ATROPINE 2.5-0.025 MG PO TABS: 2.0000 | ORAL_TABLET | Freq: Four times a day (QID) | ORAL | 0 refills | Status: DC

## 2017-05-18 NOTE — Telephone Encounter (Signed)
done

## 2017-05-18 NOTE — Telephone Encounter (Signed)
Please send in lomotil

## 2017-05-20 ENCOUNTER — Emergency Department (HOSPITAL_COMMUNITY)
Admission: EM | Admit: 2017-05-20 | Discharge: 2017-05-20 | Disposition: A | Discharge status: Home / Self Care | Payer: 59 | Attending: Emergency Medicine | Admitting: Emergency Medicine

## 2017-05-20 ENCOUNTER — Encounter (HOSPITAL_COMMUNITY): Payer: Self-pay | Admitting: Emergency Medicine

## 2017-05-20 ENCOUNTER — Emergency Department (HOSPITAL_COMMUNITY): Payer: 59

## 2017-05-20 DIAGNOSIS — I10 Essential (primary) hypertension: Secondary | ICD-10-CM | POA: Insufficient documentation

## 2017-05-20 DIAGNOSIS — G894 Chronic pain syndrome: Secondary | ICD-10-CM

## 2017-05-20 DIAGNOSIS — E109 Type 1 diabetes mellitus without complications: Secondary | ICD-10-CM | POA: Diagnosis not present

## 2017-05-20 DIAGNOSIS — Z79899 Other long term (current) drug therapy: Secondary | ICD-10-CM | POA: Insufficient documentation

## 2017-05-20 DIAGNOSIS — R112 Nausea with vomiting, unspecified: Secondary | ICD-10-CM | POA: Diagnosis not present

## 2017-05-20 DIAGNOSIS — R0789 Other chest pain: Secondary | ICD-10-CM | POA: Diagnosis present

## 2017-05-20 DIAGNOSIS — R079 Chest pain, unspecified: Secondary | ICD-10-CM | POA: Diagnosis not present

## 2017-05-20 LAB — BASIC METABOLIC PANEL
Anion gap: 11 (ref 5–15)
BUN: 13 mg/dL (ref 6–20)
CO2: 29 mmol/L (ref 22–32)
Calcium: 9.8 mg/dL (ref 8.9–10.3)
Chloride: 98 mmol/L — ABNORMAL LOW (ref 101–111)
Creatinine, Ser: 0.6 mg/dL (ref 0.44–1.00)
GFR calc Af Amer: >60 mL/min (ref >60)
GFR calc non Af Amer: >60 mL/min (ref >60)
Glucose, Bld: 196 mg/dL — ABNORMAL HIGH (ref 65–99)
Potassium: 4.6 mmol/L (ref 3.5–5.1)
Sodium: 138 mmol/L (ref 135–145)

## 2017-05-20 LAB — I-STAT BETA HCG BLOOD, ED (MC, WL, AP ONLY): I-stat hCG, quantitative: <5 m[IU]/mL (ref <5)

## 2017-05-20 LAB — URINALYSIS, ROUTINE W REFLEX MICROSCOPIC
Bacteria, UA: NONE SEEN
Bilirubin Urine: NEGATIVE
Glucose, UA: 50 mg/dL — AB
Ketones, ur: NEGATIVE mg/dL
Leukocytes, UA: NEGATIVE
Nitrite: NEGATIVE
Protein, ur: 30 mg/dL — AB
Specific Gravity, Urine: 1.015 (ref 1.005–1.030)
pH: 7 (ref 5.0–8.0)

## 2017-05-20 LAB — HEPATIC FUNCTION PANEL
ALT: 21 U/L (ref 14–54)
AST: 29 U/L (ref 15–41)
Albumin: 3.8 g/dL (ref 3.5–5.0)
Alkaline Phosphatase: 88 U/L (ref 38–126)
Bilirubin, Direct: 0.1 mg/dL (ref 0.1–0.5)
Indirect Bilirubin: 0.4 mg/dL (ref 0.3–0.9)
Total Bilirubin: 0.5 mg/dL (ref 0.3–1.2)
Total Protein: 8.1 g/dL (ref 6.5–8.1)

## 2017-05-20 LAB — CBC
HCT: 36.2 % (ref 36.0–46.0)
Hemoglobin: 12.6 g/dL (ref 12.0–15.0)
MCH: 28.7 pg (ref 26.0–34.0)
MCHC: 34.8 g/dL (ref 30.0–36.0)
MCV: 82.5 fL (ref 78.0–100.0)
Platelets: 195 10*3/uL (ref 150–400)
RBC: 4.39 MIL/uL (ref 3.87–5.11)
RDW: 11.5 % (ref 11.5–15.5)
WBC: 4.7 10*3/uL (ref 4.0–10.5)

## 2017-05-20 LAB — I-STAT TROPONIN, ED: Troponin i, poc: 0 ng/mL (ref 0.00–0.08)

## 2017-05-20 IMAGING — CR DG CHEST 2V
2 series · 2 of 2 positions shown · non-contrast
Comparison: [DATE]

CLINICAL DATA: Chest pain yesterday worsening overnight.

EXAM:
CHEST  2 VIEW

[w chest pa]
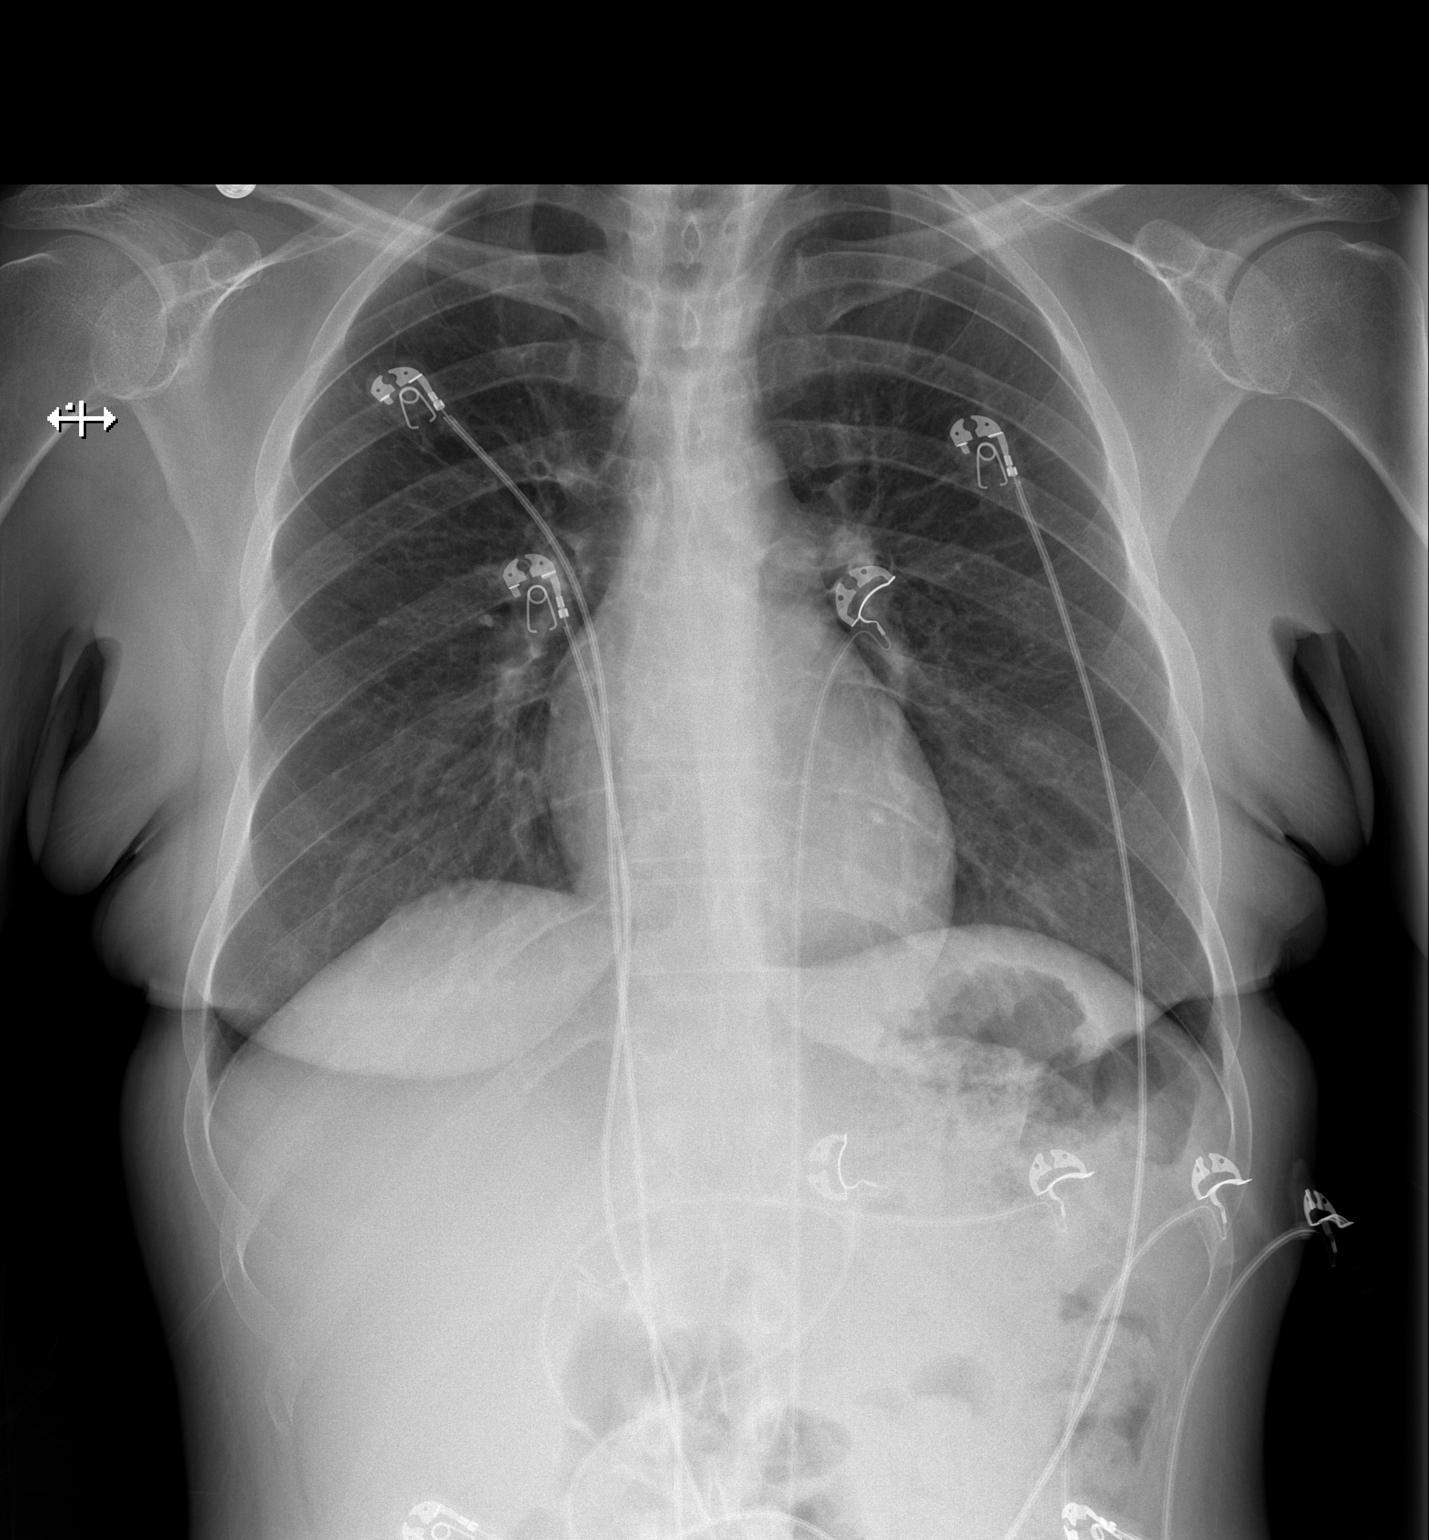

[w chest lat]
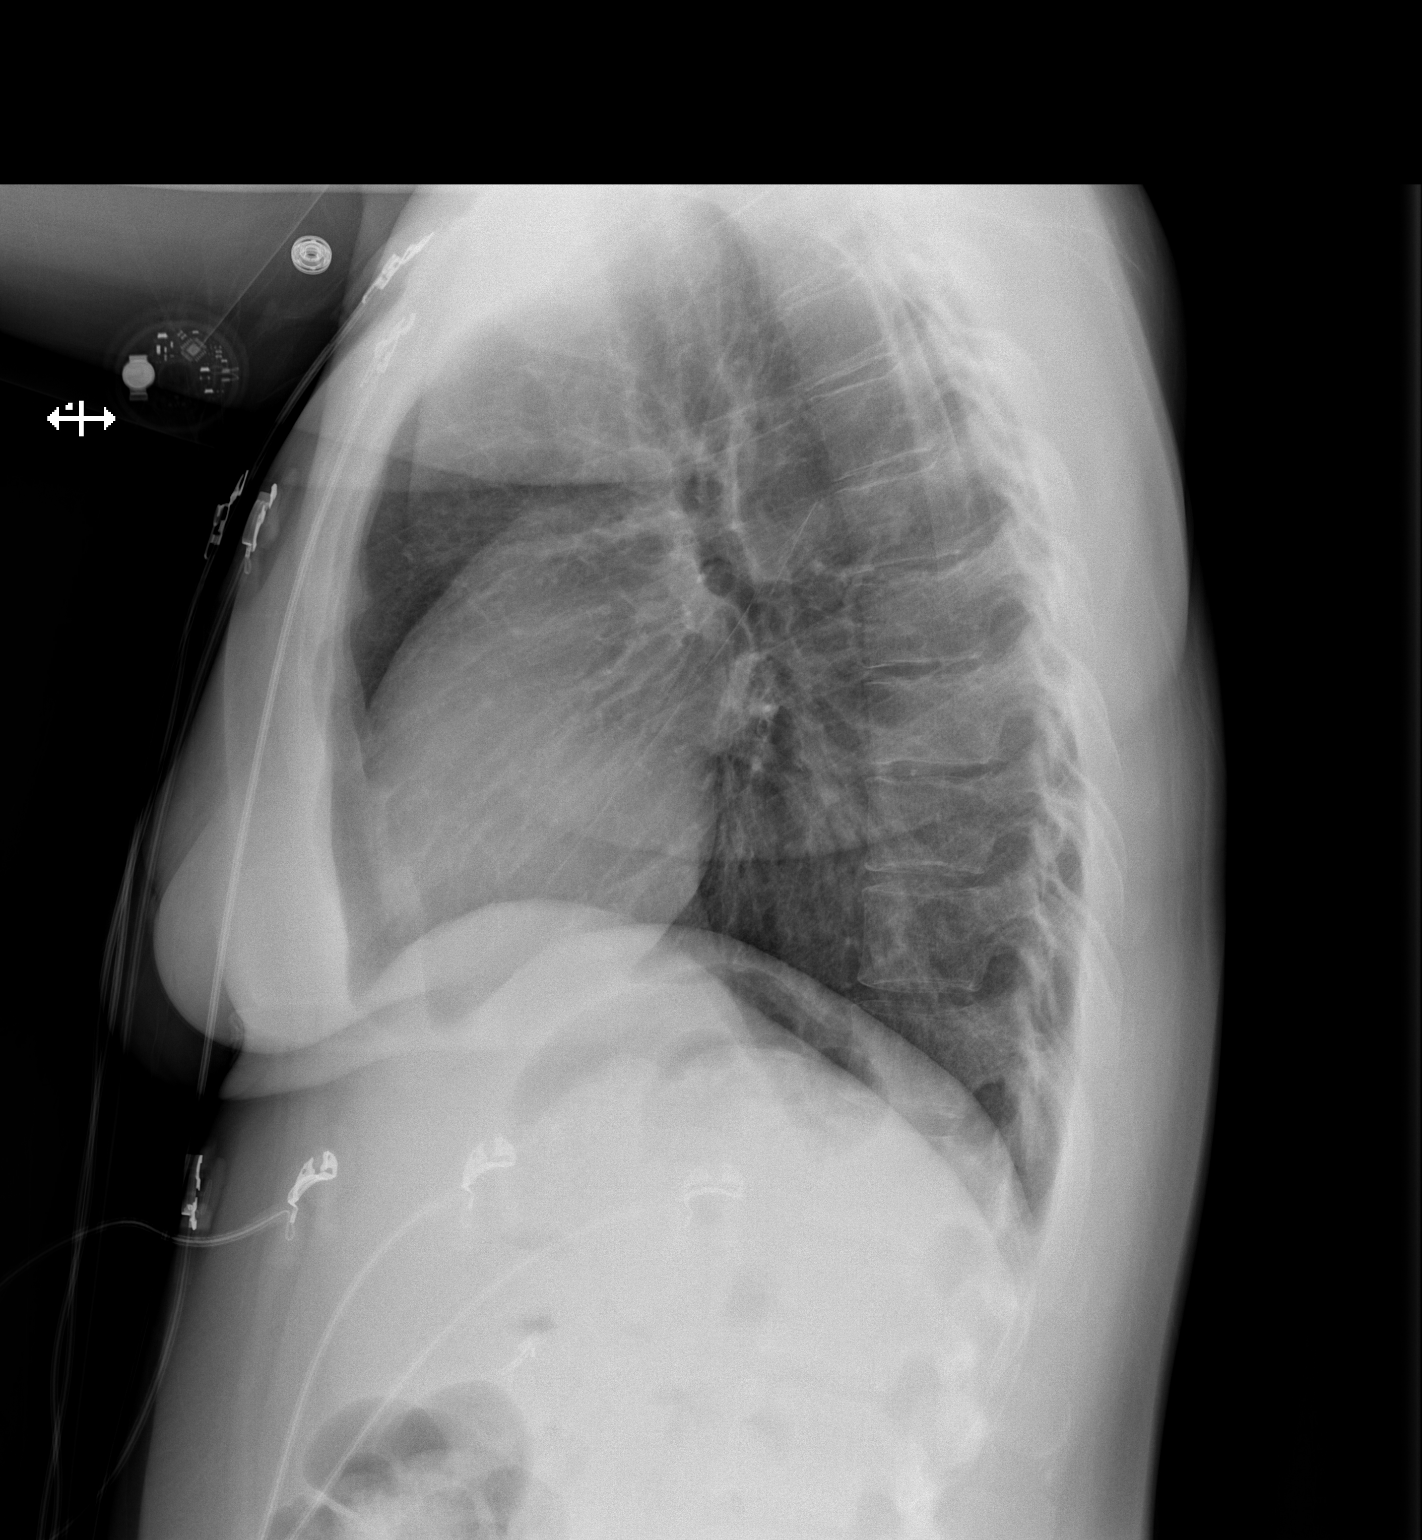

[2 of 2 positions shown; findings below may reference images not displayed]

FINDINGS: Midline trachea.  Normal heart size and mediastinal contours.

Sharp costophrenic angles.  No pneumothorax.  Clear lungs.
IMPRESSION: No active cardiopulmonary disease.

## 2017-05-20 MED ORDER — FENTANYL CITRATE (PF) 100 MCG/2ML IJ SOLN
100.0000 ug | Freq: Once | INTRAMUSCULAR | Status: AC
  Administered 2017-05-20: 100 ug via INTRAVENOUS
  Filled 2017-05-20: qty 2

## 2017-05-20 MED ORDER — SODIUM CHLORIDE 0.9 % IV BOLUS (SEPSIS)
1000.0000 mL | Freq: Once | INTRAVENOUS | Status: AC
  Administered 2017-05-20: 1000 mL via INTRAVENOUS

## 2017-05-20 NOTE — ED Provider Notes (Signed)
[facility] DEPT Provider Note   CSN: [id number] Arrival date & time: 05/20/17  0947     History   Chief Complaint Chief Complaint  Patient presents with  . Chest Pain  . Back Pain  . Nausea    HPI ONIYA MANDARINO is a 27 y.o. female.   Chest Pain   Associated symptoms include back pain, nausea and vomiting. Pertinent negatives include no abdominal pain, no fever and no weakness.  Back Pain   Associated symptoms include chest pain. Pertinent negatives include no fever, no abdominal pain and no weakness.  Patient presents with right chest and back pain.  Has been going since yesterday.  However does have history of chronic pains.  History of Crohn's disease and has chronic gluteal abscess.  States the pain has been worse since yesterday.  Has also had some nausea and vomiting.  No fevers.  No chills.  No dysuria.  Has had no real relief with her Percocet at home.  Also states she is taking some Tylenol.  Pain is dull.  Past Medical History:  Diagnosis Date  . Anxiety   . Crohn's disease ([place])   . Diabetes type 1, uncontrolled ([place]) 11/14/2011   Since age 34  . Fibromyalgia   . Gastroparesis   . Hypertension   . Infection    UTI April 2016    Patient Active Problem List   Diagnosis Date Noted  . Intractable nausea and vomiting 04/11/2017  . Nausea and vomiting 04/10/2017  . Essential hypertension   . Colitis   . Crohn's disease ([place]) 04/03/2017  . Abscess and cellulitis of gluteal region 03/24/2017  . Cellulitis and abscess of buttock 03/23/2017  . Malnutrition of moderate degree 03/19/2017  . Anasarca 03/16/2017  . 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 Diarrhea 02/27/2017  . Lower GI bleed 02/27/2017  . DKA (diabetic ketoacidosis) ([place]) 02/26/2017  . Acute metabolic encephalopathy   . DKA, type 1 ([place]) 06/11/2013  . Orthostatic hypotension dysautonomic syndrome ([place]) 04/28/2013  . Body aches  04/28/2013  . Orthostatic hypotension 04/28/2013  . Diabetes mellitus type 1 ([place]) 04/28/2013  . Protein-calorie malnutrition, severe ([place]) 04/28/2013  . Noncompliance with diabetes treatment 09/01/2012  . Dyspnea 02/28/2012  . Sinus tachycardia 02/28/2012  . Hyperglycemia without ketosis 02/28/2012  . Chest pain 12/04/2011  . Diabetes type 1, uncontrolled ([place]) 11/14/2011  . Hypertension associated with diabetes ([place]) 11/14/2011  . Gastroparesis 11/14/2011  . Hyponatremia 09/19/2011  . Abdominal pain 09/18/2011  . Metabolic alkalosis 09/62/8366  . Diabetic hyperosmolar non-ketotic state ([place]) 09/18/2011  . DIAB W/UNSPEC COMP TYPE I [JUV TYPE] UNCNTRL 06/04/2010  . CONSTIPATION 01/01/2010  . RECTAL PAIN 01/01/2010    Past Surgical History:  Procedure Laterality Date  . ANKLE SURGERY    . CHOLECYSTECTOMY  11/15/2011   Procedure: LAPAROSCOPIC CHOLECYSTECTOMY WITH INTRAOPERATIVE CHOLANGIOGRAM;  Surgeon: [name], MD;  Location: [facility];  Service: General;  Laterality: N/A;  . ESOPHAGOGASTRODUODENOSCOPY  12/03/2011   Procedure: ESOPHAGOGASTRODUODENOSCOPY (EGD);  Surgeon: Beryle Beams, MD;  Location: [facility];  Service: Endoscopy;  Laterality: N/A;  . FLEXIBLE SIGMOIDOSCOPY N/A 03/10/2017   Procedure: FLEXIBLE SIGMOIDOSCOPY;  Surgeon: Carol Ada, MD;  Location: [facility];  Service: Endoscopy;  Laterality: N/A;  . INCISION AND DRAINAGE PERIRECTAL ABSCESS N/A 03/01/2017   Procedure: IRRIGATION AND DEBRIDEMENT PERIRECTAL ABSCESS;  Surgeon: Alphonsa Overall, MD;  Location: [facility];  Service: General;  Laterality: N/A;  . IRRIGATION AND  DEBRIDEMENT BUTTOCKS N/A 03/23/2017   Procedure: IRRIGATION AND DEBRIDEMENT BUTTOCKS, SETON PLACEMENT;  Surgeon: [name], MD;  Location: [facility];  Service: General;  Laterality: N/A;  . LAPAROSCOPY  11/23/2011   Procedure: LAPAROSCOPY DIAGNOSTIC;  Surgeon: Edward Jolly, MD;  Location: [facility];  Service: General;  Laterality: N/A;    OB  History    Gravida Para Term Preterm AB Living   1 1   1   1    SAB TAB Ectopic Multiple Live Births         0 1       Home Medications    Prior to Admission medications   Medication Sig Start Date End Date Taking? Authorizing Provider  citalopram (CELEXA) 20 MG tablet Take 1 tablet (20 mg total) by mouth daily. 05/13/17  Yes Henson, Vickie L, NP-C  clindamycin (CLEOCIN) 300 MG capsule Take 1 capsule (300 mg total) by mouth 4 (four) times daily for 14 days. 05/14/17 05/28/17 Yes McDonald, Mia A, PA-C  cyclobenzaprine (FLEXERIL) 10 MG tablet Take 1 tablet (10 mg total) by mouth 2 (two) times daily as needed for muscle spasms. 05/18/17  Yes Henson, Vickie L, NP-C  diphenoxylate-atropine (LOMOTIL) 2.5-0.025 MG tablet Take 2 tablets by mouth 4 (four) times daily. 05/18/17  Yes Henson, Vickie L, NP-C  EPINEPHrine (EPIPEN 2-PAK) 0.3 mg/0.3 mL IJ SOAJ injection Inject 0.3 mLs (0.3 mg total) into the muscle as needed (allergic reaction). 03/21/17  Yes Aline August, MD  ferrous sulfate 325 (65 FE) MG tablet Take 1 tablet (325 mg total) by mouth 2 (two) times daily with a meal. 05/13/17  Yes Henson, Vickie L, NP-C  folic acid (FOLVITE) 1 MG tablet Take 1 tablet (1 mg total) by mouth daily. 05/13/17  Yes Henson, Vickie L, NP-C  furosemide (LASIX) 40 MG tablet Take 1 tablet (40 mg total) by mouth daily. 05/13/17 06/12/17 Yes Henson, Vickie L, NP-C  gabapentin (NEURONTIN) 300 MG capsule Take 1 capsule (300 mg total) by mouth 2 (two) times daily. 04/03/17  Yes Henson, Vickie L, NP-C  glucose 4 GM chewable tablet Chew 1 tablet (4 g total) by mouth as needed for low blood sugar. 08/10/14  Yes Denney, Rachelle A, CNM  ibuprofen (ADVIL,MOTRIN) 800 MG tablet Take 800 mg by mouth 3 (three) times daily. 04/27/17  Yes [provider]  insulin lispro (HUMALOG) 100 UNIT/ML injection Inject 0.02 mLs (2 Units total) into the skin 3 (three) times daily with meals. Patient taking differently: Inject 1-9 Units into the  skin 3 (three) times daily with meals. Sliding scale 04/09/17  Yes Renato Shin, MD  LANTUS 100 UNIT/ML injection Inject 0.18 mLs (18 Units total) into the skin every morning. 05/07/17  Yes Renato Shin, MD  lipase/protease/amylase (CREON) 36000 UNITS CPEP capsule Take 1 capsule (36,000 Units total) by mouth 3 (three) times daily with meals. 03/14/17  Yes Lavina Hamman, MD  loperamide (IMODIUM) 2 MG capsule Take 1 capsule (2 mg total) by mouth 3 (three) times daily before meals. DO NOT TAKE IT FOR THE DAY YOU DONT HAVE A BOWEL MOVEMENT. 04/03/17  Yes Henson, Vickie L, NP-C  methocarbamol (ROBAXIN) 500 MG tablet Take 1 tablet (500 mg total) by mouth every 8 (eight) hours as needed for muscle spasms. 03/14/17  Yes Lavina Hamman, MD  metoCLOPramide (REGLAN) 5 MG tablet Take 5 mg by mouth 4 (four) times daily -  before meals and at bedtime.   Yes [provider]  oxyCODONE-acetaminophen (PERCOCET/ROXICET) 5-325  MG tablet Take 1 tablet by mouth every 8 (eight) hours as needed for severe pain. 05/14/17  Yes McDonald, Mia A, PA-C  promethazine (PHENERGAN) 12.5 MG tablet Take 1 tablet (12.5 mg total) by mouth every 6 (six) hours as needed for nausea or vomiting. 05/13/17  Yes Henson, Vickie L, NP-C  cholestyramine light (PREVALITE) 4 g packet Take 1 packet (4 g total) by mouth 3 (three) times daily. Patient not taking: Reported on 05/13/2017 03/14/17   Lavina Hamman, MD    Family History Family History  Problem Relation Age of Onset  . Diabetes Mother   . Hypertension Father     Social History Social History   Tobacco Use  . Smoking status: Never Smoker  . Smokeless tobacco: Never Used  Substance Use Topics  . Alcohol use: No    Alcohol/week: 0.0 oz    Frequency: Never  . Drug use: No     Allergies   Peanut-containing drug products and Lactose intolerance (gi)   Review of Systems Review of Systems  Constitutional: Negative for chills and fever.  HENT: Negative for congestion.    Cardiovascular: Positive for chest pain.  Gastrointestinal: Positive for nausea and vomiting. Negative for abdominal pain.  Genitourinary: Positive for flank pain.  Musculoskeletal: Positive for back pain.  Skin: Positive for wound.  Neurological: Negative for syncope and weakness.  Psychiatric/Behavioral: Negative for confusion.     Physical Exam Updated Vital Signs BP (!) 135/100   Pulse (!) 102   Temp 98.6 F (37 C) (Oral)   Resp 18   SpO2 99%   Physical Exam  Constitutional: She appears well-developed.  HENT:  Head: Atraumatic.  Neck: Neck supple.  Cardiovascular:  Mild tachycardia  Pulmonary/Chest:  Mild tenderness on anterior chest wall.  Abdominal:  Mild tenderness right CVA area.  Mild right upper quadrant abdominal tenderness.  No hernia.  No rebound or guarding.  Musculoskeletal:  Right gluteal area has Penrose drains in place.  Not much drainage.  No real erythema.  Neurological: She is alert.  Skin: Skin is warm. Capillary refill takes less than 2 seconds.     ED Treatments / Results  Labs (all labs ordered are listed, but only abnormal results are displayed) Labs Reviewed  BASIC METABOLIC PANEL - Abnormal; Notable for the following components:      Result Value   Chloride 98 (*)    Glucose, Bld 196 (*)    All other components within normal limits  URINALYSIS, ROUTINE W REFLEX MICROSCOPIC - Abnormal; Notable for the following components:   Glucose, UA 50 (*)    Hgb urine dipstick MODERATE (*)    Protein, ur 30 (*)    Squamous Epithelial / LPF 0-5 (*)    All other components within normal limits  CBC  HEPATIC FUNCTION PANEL  I-STAT TROPONIN, ED  I-STAT BETA HCG BLOOD, ED ([facility], [facility], [facility] ONLY)    EKG  EKG Interpretation None       Radiology Dg Chest 2 View  Result Date: 05/20/2017 CLINICAL DATA:  Chest pain yesterday worsening overnight. EXAM: CHEST  2 VIEW COMPARISON:  05/14/2017 FINDINGS: Midline trachea.  Normal heart size and  mediastinal contours. Sharp costophrenic angles.  No pneumothorax.  Clear lungs. IMPRESSION: No active cardiopulmonary disease. Electronically Signed   By: Abigail Miyamoto M.D.   On: 05/20/2017 09:39    Procedures Procedures (including critical care time)  Medications Ordered in ED Medications  sodium chloride 0.9 % bolus 1,000 mL (0 mLs  Intravenous Stopped 05/20/17 1148)  fentaNYL (SUBLIMAZE) injection 100 mcg (100 mcg Intravenous Given 05/20/17 0959)     Initial Impression / Assessment and Plan / ED Course  I have reviewed the triage vital signs and the nursing notes.  Pertinent labs & imaging results that were available during my care of the patient were reviewed by me and considered in my medical decision making (see chart for details).     Patient with pain in chest and back.  History of same.  Has had multiple visits for chronic pain.  Also chronic abscess.  Pain somewhat improved after treatment.  Labs reassuring.  Doubt cardiac cause.  Doubt severe infection as a cause.  Will discharge home to follow-up with PCP as needed.  Final Clinical Impressions(s) / ED Diagnoses   Final diagnoses:  Chronic pain syndrome    ED Discharge Orders    None       Davonna Belling, MD 05/20/17 1222

## 2017-05-20 NOTE — ED Triage Notes (Signed)
Patient here from home with complaints of central chest pain radiating into lower back that started yesterday.  Nausea, no vomiting. Denies SOB. Hx of same.

## 2017-05-21 ENCOUNTER — Encounter (HOSPITAL_COMMUNITY): Payer: Self-pay | Admitting: Emergency Medicine

## 2017-05-21 ENCOUNTER — Emergency Department (HOSPITAL_COMMUNITY)
Admission: EM | Admit: 2017-05-21 | Discharge: 2017-05-21 | Disposition: A | Discharge status: Home / Self Care | Payer: 59 | Attending: Emergency Medicine | Admitting: Emergency Medicine

## 2017-05-21 DIAGNOSIS — R0789 Other chest pain: Secondary | ICD-10-CM

## 2017-05-21 DIAGNOSIS — Z79899 Other long term (current) drug therapy: Secondary | ICD-10-CM | POA: Diagnosis not present

## 2017-05-21 DIAGNOSIS — Y939 Activity, unspecified: Secondary | ICD-10-CM | POA: Diagnosis not present

## 2017-05-21 DIAGNOSIS — Y929 Unspecified place or not applicable: Secondary | ICD-10-CM | POA: Diagnosis not present

## 2017-05-21 DIAGNOSIS — M549 Dorsalgia, unspecified: Secondary | ICD-10-CM | POA: Diagnosis not present

## 2017-05-21 DIAGNOSIS — E109 Type 1 diabetes mellitus without complications: Secondary | ICD-10-CM | POA: Diagnosis not present

## 2017-05-21 DIAGNOSIS — Y999 Unspecified external cause status: Secondary | ICD-10-CM | POA: Insufficient documentation

## 2017-05-21 DIAGNOSIS — X58XXXA Exposure to other specified factors, initial encounter: Secondary | ICD-10-CM | POA: Diagnosis not present

## 2017-05-21 DIAGNOSIS — S39012A Strain of muscle, fascia and tendon of lower back, initial encounter: Secondary | ICD-10-CM | POA: Insufficient documentation

## 2017-05-21 DIAGNOSIS — S3992XA Unspecified injury of lower back, initial encounter: Secondary | ICD-10-CM | POA: Diagnosis present

## 2017-05-21 DIAGNOSIS — S39012D Strain of muscle, fascia and tendon of lower back, subsequent encounter: Secondary | ICD-10-CM | POA: Diagnosis not present

## 2017-05-21 DIAGNOSIS — I10 Essential (primary) hypertension: Secondary | ICD-10-CM | POA: Diagnosis not present

## 2017-05-21 MED ORDER — LIDOCAINE 5 % EX PTCH: 1.0000 | MEDICATED_PATCH | CUTANEOUS | 0 refills | Status: DC

## 2017-05-21 MED ORDER — KETOROLAC TROMETHAMINE 15 MG/ML IJ SOLN
15.0000 mg | Freq: Once | INTRAMUSCULAR | Status: AC
  Administered 2017-05-21: 15 mg via INTRAMUSCULAR
  Filled 2017-05-21: qty 1

## 2017-05-21 MED ORDER — CYCLOBENZAPRINE HCL 10 MG PO TABS: 10.0000 mg | ORAL_TABLET | Freq: Two times a day (BID) | ORAL | 0 refills | Status: DC | PRN

## 2017-05-21 NOTE — ED Triage Notes (Signed)
Per pt, states she was here yesterday for the same symptoms-states back pain due to an abscess on her right buttock which has caused her to "sit funny" and sprain her back-also complaining of chest pain which she says is due to her pain-states she has not seen ortho for her back and has not followed up with her PCP for referrals

## 2017-05-21 NOTE — ED Provider Notes (Signed)
[facility]-EMERGENCY DEPT Provider Note   CSN: [id number] Arrival date & time: 05/21/17  4098     History   Chief Complaint Chief Complaint  Patient presents with  . Back Pain  . Chest Pain    HPI [name] is a 27 y.o. female with a past medical history of Crohn's disease, type 1 diabetes, fibromyalgia, chronic pain, hypertension, who presents to ED for continued back pain and chest pain.  She was seen here yesterday for similar symptoms as well as numerous times in the past but has not followed up with an orthopedist or her primary care doctor.  She states that she did speak to her primary care doctor who is in the process of referring her to an orthopedist.  She reports no improvement in her symptoms with her home Percocet, ibuprofen and Tylenol.  She states that due to the abscess on her buttocks, she has had to "sit funny" which is causing her back pain to worsen which is radiating to her chest. Her lab work yesterday was unremarkable but states that she continues to have soreness.  She denies any shortness of breath, hemoptysis, wheezing, fevers, URI symptoms, injuries or falls, history of back surgeries, numbness in legs, loss of bowel or bladder function.   HPI  Past Medical History:  Diagnosis Date  . Anxiety   . Crohn's disease (HCC)   . Diabetes type 1, uncontrolled (HCC) 11/14/2011   Since age 70  . Fibromyalgia   . Gastroparesis   . Hypertension   . Infection    UTI April 2016    Patient Active Problem List   Diagnosis Date Noted  . Intractable nausea and vomiting 04/11/2017  . Nausea and vomiting 04/10/2017  . Essential hypertension   . Colitis   . Crohn's disease (HCC) 04/03/2017  . Abscess and cellulitis of gluteal region 03/24/2017  . Cellulitis and abscess of buttock 03/23/2017  . Malnutrition of moderate degree 03/19/2017  . Anasarca 03/16/2017  . 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  Diarrhea 02/27/2017  . Lower GI bleed 02/27/2017  . DKA (diabetic ketoacidosis) (HCC) 02/26/2017  . Acute metabolic encephalopathy   . DKA, type 1 (HCC) 06/11/2013  . Orthostatic hypotension dysautonomic syndrome (HCC) 04/28/2013  . Body aches 04/28/2013  . Orthostatic hypotension 04/28/2013  . Diabetes mellitus type 1 (HCC) 04/28/2013  . Protein-calorie malnutrition, severe (HCC) 04/28/2013  . Noncompliance with diabetes treatment 09/01/2012  . Dyspnea 02/28/2012  . Sinus tachycardia 02/28/2012  . Hyperglycemia without ketosis 02/28/2012  . Chest pain 12/04/2011  . Diabetes type 1, uncontrolled (HCC) 11/14/2011  . Hypertension associated with diabetes (HCC) 11/14/2011  . Gastroparesis 11/14/2011  . Hyponatremia 09/19/2011  . Abdominal pain 09/18/2011  . Metabolic alkalosis 09/18/2011  . Diabetic hyperosmolar non-ketotic state (HCC) 09/18/2011  . DIAB W/UNSPEC COMP TYPE I [JUV TYPE] UNCNTRL 06/04/2010  . CONSTIPATION 01/01/2010  . RECTAL PAIN 01/01/2010    Past Surgical History:  Procedure Laterality Date  . ANKLE SURGERY    . CHOLECYSTECTOMY  11/15/2011   Procedure: LAPAROSCOPIC CHOLECYSTECTOMY WITH INTRAOPERATIVE CHOLANGIOGRAM;  Surgeon: [name], MD;  Location: [facility];  Service: General;  Laterality: N/A;  . ESOPHAGOGASTRODUODENOSCOPY  12/03/2011   Procedure: ESOPHAGOGASTRODUODENOSCOPY (EGD);  Surgeon: Theda Belfast, MD;  Location: [facility];  Service: Endoscopy;  Laterality: N/A;  . FLEXIBLE SIGMOIDOSCOPY N/A 03/10/2017   Procedure: FLEXIBLE SIGMOIDOSCOPY;  Surgeon: Jeani Hawking, MD;  Location: [facility];  Service: Endoscopy;  Laterality: N/A;  . INCISION AND DRAINAGE PERIRECTAL ABSCESS N/A 03/01/2017   Procedure: IRRIGATION AND DEBRIDEMENT PERIRECTAL ABSCESS;  Surgeon: Ovidio Kin, MD;  Location: [facility];  Service: General;  Laterality: N/A;  . IRRIGATION AND DEBRIDEMENT BUTTOCKS N/A 03/23/2017   Procedure: IRRIGATION AND DEBRIDEMENT BUTTOCKS, SETON PLACEMENT;   Surgeon: Romie Levee, MD;  Location: [facility];  Service: General;  Laterality: N/A;  . LAPAROSCOPY  11/23/2011   Procedure: LAPAROSCOPY DIAGNOSTIC;  Surgeon: Mariella Saa, MD;  Location: [facility];  Service: General;  Laterality: N/A;    OB History    Gravida Para Term Preterm AB Living   1 1   1   1    SAB TAB Ectopic Multiple Live Births         0 1       Home Medications    Prior to Admission medications   Medication Sig Start Date End Date Taking? Authorizing Provider  citalopram (CELEXA) 20 MG tablet Take 1 tablet (20 mg total) by mouth daily. 05/13/17  Yes Henson, Vickie L, NP-C  clindamycin (CLEOCIN) 300 MG capsule Take 1 capsule (300 mg total) by mouth 4 (four) times daily for 14 days. 05/14/17 05/28/17 Yes McDonald, Mia A, PA-C  diphenoxylate-atropine (LOMOTIL) 2.5-0.025 MG tablet Take 2 tablets by mouth 4 (four) times daily. 05/18/17  Yes Henson, Vickie L, NP-C  EPINEPHrine (EPIPEN 2-PAK) 0.3 mg/0.3 mL IJ SOAJ injection Inject 0.3 mLs (0.3 mg total) into the muscle as needed (allergic reaction). 03/21/17  Yes Glade Lloyd, MD  ferrous sulfate 325 (65 FE) MG tablet Take 1 tablet (325 mg total) by mouth 2 (two) times daily with a meal. 05/13/17  Yes Henson, Vickie L, NP-C  folic acid (FOLVITE) 1 MG tablet Take 1 tablet (1 mg total) by mouth daily. 05/13/17  Yes Henson, Vickie L, NP-C  furosemide (LASIX) 40 MG tablet Take 1 tablet (40 mg total) by mouth daily. 05/13/17 06/12/17 Yes Henson, Vickie L, NP-C  gabapentin (NEURONTIN) 300 MG capsule Take 1 capsule (300 mg total) by mouth 2 (two) times daily. 04/03/17  Yes Henson, Vickie L, NP-C  glucose 4 GM chewable tablet Chew 1 tablet (4 g total) by mouth as needed for low blood sugar. 08/10/14  Yes Denney, Rachelle A, CNM  ibuprofen (ADVIL,MOTRIN) 800 MG tablet Take 800 mg by mouth 3 (three) times daily. 04/27/17  Yes [provider]  insulin lispro (HUMALOG) 100 UNIT/ML injection Inject 0.02 mLs (2 Units total) into the skin 3  (three) times daily with meals. Patient taking differently: Inject 1-9 Units into the skin 3 (three) times daily with meals. Sliding scale 04/09/17  Yes Romero Belling, MD  LANTUS 100 UNIT/ML injection Inject 0.18 mLs (18 Units total) into the skin every morning. 05/07/17  Yes Romero Belling, MD  lipase/protease/amylase (CREON) 36000 UNITS CPEP capsule Take 1 capsule (36,000 Units total) by mouth 3 (three) times daily with meals. 03/14/17  Yes Rolly Salter, MD  loperamide (IMODIUM) 2 MG capsule Take 1 capsule (2 mg total) by mouth 3 (three) times daily before meals. DO NOT TAKE IT FOR THE DAY YOU DONT HAVE A BOWEL MOVEMENT. 04/03/17  Yes Henson, Vickie L, NP-C  methocarbamol (ROBAXIN) 500 MG tablet Take 1 tablet (500 mg total) by mouth every 8 (eight) hours as needed for muscle spasms. 03/14/17  Yes Rolly Salter, MD  metoCLOPramide (REGLAN) 5 MG tablet Take 5 mg by mouth 4 (four) times daily -  before meals and at bedtime.  Yes [provider]  promethazine (PHENERGAN) 12.5 MG tablet Take 1 tablet (12.5 mg total) by mouth every 6 (six) hours as needed for nausea or vomiting. 05/13/17  Yes Henson, Vickie L, NP-C  cholestyramine light (PREVALITE) 4 g packet Take 1 packet (4 g total) by mouth 3 (three) times daily. Patient not taking: Reported on 05/13/2017 03/14/17   Rolly Salter, MD  cyclobenzaprine (FLEXERIL) 10 MG tablet Take 1 tablet (10 mg total) by mouth 2 (two) times daily as needed for muscle spasms. 05/21/17   Birgit Nowling, PA-C  lidocaine (LIDODERM) 5 % Place 1 patch onto the skin daily. Remove & Discard patch within 12 hours or as directed by MD 05/21/17   Dietrich Pates, PA-C  oxyCODONE-acetaminophen (PERCOCET/ROXICET) 5-325 MG tablet Take 1 tablet by mouth every 8 (eight) hours as needed for severe pain. Patient not taking: Reported on 05/21/2017 05/14/17   Barkley Boards, PA-C    Family History Family History  Problem Relation Age of Onset  . Diabetes Mother   . Hypertension  Father     Social History Social History   Tobacco Use  . Smoking status: Never Smoker  . Smokeless tobacco: Never Used  Substance Use Topics  . Alcohol use: No    Alcohol/week: 0.0 oz    Frequency: Never  . Drug use: No     Allergies   Peanut-containing drug products and Lactose intolerance (gi)   Review of Systems Review of Systems  Constitutional: Negative for appetite change, chills and fever.  HENT: Negative for ear pain, rhinorrhea, sneezing and sore throat.   Eyes: Negative for photophobia and visual disturbance.  Respiratory: Negative for cough, chest tightness, shortness of breath and wheezing.   Cardiovascular: Positive for chest pain. Negative for palpitations.  Gastrointestinal: Negative for abdominal pain, blood in stool, constipation, diarrhea, nausea and vomiting.  Genitourinary: Negative for dysuria, hematuria and urgency.  Musculoskeletal: Positive for back pain and myalgias.  Skin: Negative for rash.  Neurological: Negative for dizziness, weakness and light-headedness.     Physical Exam Updated Vital Signs BP (!) 140/115   Pulse (!) 115   Temp 98.5 F (36.9 C) (Oral)   Resp 16   SpO2 100%   Physical Exam  Constitutional: She appears well-developed and well-nourished. No distress.  HENT:  Head: Normocephalic and atraumatic.  Nose: Nose normal.  Eyes: Conjunctivae and EOM are normal. Left eye exhibits no discharge. No scleral icterus.  Neck: Normal range of motion. Neck supple.  Cardiovascular: Normal rate, regular rhythm, normal heart sounds and intact distal pulses. Exam reveals no gallop and no friction rub.  No murmur heard. Pulmonary/Chest: Effort normal and breath sounds normal. No respiratory distress. She exhibits tenderness.    Abdominal: Soft. Bowel sounds are normal. She exhibits no distension. There is no tenderness. There is no guarding.  Musculoskeletal: Normal range of motion. She exhibits tenderness. She exhibits no edema.        Back:  Diffuse tenderness to palpation of the chest and back with no midline tenderness present. No midline spinal tenderness present in lumbar, thoracic or cervical spine. No step-off palpated. No visible bruising, edema or temperature change noted. No objective signs of numbness present. No saddle anesthesia. 2+ DP pulses bilaterally. Sensation intact to light touch. Strength 5/5 in bilateral lower extremities.  Neurological: She is alert. She exhibits normal muscle tone. Coordination normal.  Skin: Skin is warm and dry. No rash noted.  Psychiatric: She has a normal mood and affect.  Nursing note and vitals reviewed.    ED Treatments / Results  Labs (all labs ordered are listed, but only abnormal results are displayed) Labs Reviewed - No data to display  EKG  EKG Interpretation None       Radiology Dg Chest 2 View  Result Date: 05/20/2017 CLINICAL DATA:  Chest pain yesterday worsening overnight. EXAM: CHEST  2 VIEW COMPARISON:  05/14/2017 FINDINGS: Midline trachea.  Normal heart size and mediastinal contours. Sharp costophrenic angles.  No pneumothorax.  Clear lungs. IMPRESSION: No active cardiopulmonary disease. Electronically Signed   By: Jeronimo Greaves M.D.   On: 05/20/2017 09:39    Procedures Procedures (including critical care time)  Medications Ordered in ED Medications  ketorolac (TORADOL) 15 MG/ML injection 15 mg (15 mg Intramuscular Given 05/21/17 1339)     Initial Impression / Assessment and Plan / ED Course  I have reviewed the triage vital signs and the nursing notes.  Pertinent labs & imaging results that were available during my care of the patient were reviewed by me and considered in my medical decision making (see chart for details).     Patient presents to ED for ongoing chest and back pain.  She does have a history of similar symptoms and was seen and evaluated for this yesterday with negative workup.  States that the abscess on her buttock as well as  causing her to have bad posture in turn worsening her back pain. Reviewed lab work from yesterday with negative troponin, CBC, CMP, CXR, EKG. She appears overall well.  Mildly tachycardic and hypertensive but this could be due to her discomfort.  She has diffuse tenderness to palpation of the back and chest but is neurologically intact.  I suspect that this is a flareup of her chronic pain based on her negative workup yesterday.  She did receive numerous narcotic prescriptions in the past 2 months.  She was given Toradol here with only mild improvement in her symptoms.  Will discharge home with lidocaine patches for her back, Flexeril to be taken as needed.  I did encourage her to follow-up with her primary care provider for further evaluation and any referrals to orthopedist.  Patient appears stable for discharge at this time.  Strict return precautions given.  Portions of this note were generated with Scientist, clinical (histocompatibility and immunogenetics). Dictation errors may occur despite best attempts at proofreading.   Final Clinical Impressions(s) / ED Diagnoses   Final diagnoses:  Chest wall pain  Strain of lumbar region, subsequent encounter    ED Discharge Orders        Ordered    cyclobenzaprine (FLEXERIL) 10 MG tablet  2 times daily PRN     05/21/17 1431    lidocaine (LIDODERM) 5 %  Every 24 hours     05/21/17 1431       Dietrich Pates, PA-C 05/21/17 1438    Gerhard Munch, MD 05/24/17 2033

## 2017-05-21 NOTE — Discharge Instructions (Signed)
Please read attached information regarding her condition. Follow-up with patient care center listed below for further evaluation. Take Flexeril as needed for pain.  Continue ibuprofen or Tylenol as needed as well. Apply lidocaine patch to back as needed. Return to ED for worsening pain, injuries or falls, shortness of breath.

## 2017-05-22 ENCOUNTER — Emergency Department (HOSPITAL_COMMUNITY)
Admission: EM | Admit: 2017-05-22 | Discharge: 2017-05-22 | Disposition: A | Discharge status: Home / Self Care | Payer: 59 | Attending: Emergency Medicine | Admitting: Emergency Medicine

## 2017-05-22 ENCOUNTER — Other Ambulatory Visit: Payer: Self-pay

## 2017-05-22 ENCOUNTER — Encounter (HOSPITAL_COMMUNITY): Payer: Self-pay

## 2017-05-22 DIAGNOSIS — Z91010 Allergy to peanuts: Secondary | ICD-10-CM | POA: Insufficient documentation

## 2017-05-22 DIAGNOSIS — M545 Low back pain, unspecified: Secondary | ICD-10-CM

## 2017-05-22 DIAGNOSIS — I10 Essential (primary) hypertension: Secondary | ICD-10-CM | POA: Insufficient documentation

## 2017-05-22 DIAGNOSIS — Z79899 Other long term (current) drug therapy: Secondary | ICD-10-CM | POA: Insufficient documentation

## 2017-05-22 DIAGNOSIS — L0231 Cutaneous abscess of buttock: Secondary | ICD-10-CM | POA: Insufficient documentation

## 2017-05-22 DIAGNOSIS — Z794 Long term (current) use of insulin: Secondary | ICD-10-CM | POA: Insufficient documentation

## 2017-05-22 DIAGNOSIS — E109 Type 1 diabetes mellitus without complications: Secondary | ICD-10-CM | POA: Diagnosis not present

## 2017-05-22 DIAGNOSIS — L0291 Cutaneous abscess, unspecified: Secondary | ICD-10-CM

## 2017-05-22 LAB — URINALYSIS, ROUTINE W REFLEX MICROSCOPIC
Bilirubin Urine: NEGATIVE
Glucose, UA: 50 mg/dL — AB
Ketones, ur: NEGATIVE mg/dL
Leukocytes, UA: NEGATIVE
Nitrite: NEGATIVE
Protein, ur: NEGATIVE mg/dL
Specific Gravity, Urine: 1.013 (ref 1.005–1.030)
Squamous Epithelial / LPF: NONE SEEN
pH: 7 (ref 5.0–8.0)

## 2017-05-22 LAB — CBG MONITORING, ED
Glucose-Capillary: 54 mg/dL — ABNORMAL LOW (ref 65–99)
Glucose-Capillary: 76 mg/dL (ref 65–99)
Glucose-Capillary: 89 mg/dL (ref 65–99)

## 2017-05-22 LAB — CBC WITH DIFFERENTIAL/PLATELET
Basophils Absolute: 0 10*3/uL (ref 0.0–0.1)
Basophils Relative: 1 %
Eosinophils Absolute: 0.2 10*3/uL (ref 0.0–0.7)
Eosinophils Relative: 3 %
HCT: 33.5 % — ABNORMAL LOW (ref 36.0–46.0)
Hemoglobin: 11.3 g/dL — ABNORMAL LOW (ref 12.0–15.0)
Lymphocytes Relative: 56 %
Lymphs Abs: 3 10*3/uL (ref 0.7–4.0)
MCH: 27.7 pg (ref 26.0–34.0)
MCHC: 33.7 g/dL (ref 30.0–36.0)
MCV: 82.1 fL (ref 78.0–100.0)
Monocytes Absolute: 0.2 10*3/uL (ref 0.1–1.0)
Monocytes Relative: 3 %
Neutro Abs: 1.9 10*3/uL (ref 1.7–7.7)
Neutrophils Relative %: 37 %
Platelets: 210 10*3/uL (ref 150–400)
RBC: 4.08 MIL/uL (ref 3.87–5.11)
RDW: 11.4 % — ABNORMAL LOW (ref 11.5–15.5)
WBC: 5.3 10*3/uL (ref 4.0–10.5)

## 2017-05-22 LAB — COMPREHENSIVE METABOLIC PANEL
ALT: 32 U/L (ref 14–54)
AST: 37 U/L (ref 15–41)
Albumin: 3.4 g/dL — ABNORMAL LOW (ref 3.5–5.0)
Alkaline Phosphatase: 78 U/L (ref 38–126)
Anion gap: 9 (ref 5–15)
BUN: 13 mg/dL (ref 6–20)
CO2: 28 mmol/L (ref 22–32)
Calcium: 9.3 mg/dL (ref 8.9–10.3)
Chloride: 101 mmol/L (ref 101–111)
Creatinine, Ser: 0.53 mg/dL (ref 0.44–1.00)
GFR calc Af Amer: >60 mL/min (ref >60)
GFR calc non Af Amer: >60 mL/min (ref >60)
Glucose, Bld: 116 mg/dL — ABNORMAL HIGH (ref 65–99)
Potassium: 4.4 mmol/L (ref 3.5–5.1)
Sodium: 138 mmol/L (ref 135–145)
Total Bilirubin: 0.2 mg/dL — ABNORMAL LOW (ref 0.3–1.2)
Total Protein: 7.1 g/dL (ref 6.5–8.1)

## 2017-05-22 LAB — I-STAT CG4 LACTIC ACID, ED: Lactic Acid, Venous: 1.29 mmol/L (ref 0.5–1.9)

## 2017-05-22 LAB — I-STAT BETA HCG BLOOD, ED (MC, WL, AP ONLY): I-stat hCG, quantitative: <5 m[IU]/mL (ref <5)

## 2017-05-22 MED ORDER — OXYCODONE-ACETAMINOPHEN 5-325 MG PO TABS: 1.0000 | ORAL_TABLET | ORAL | 0 refills | Status: DC | PRN

## 2017-05-22 MED ORDER — OXYCODONE-ACETAMINOPHEN 5-325 MG PO TABS
1.0000 | ORAL_TABLET | Freq: Once | ORAL | Status: AC
  Administered 2017-05-22: 1 via ORAL
  Filled 2017-05-22: qty 1

## 2017-05-22 NOTE — Discharge Instructions (Signed)
Your workup today was overall reassuring.  We did not find evidence of worsening infection.  We did not feel he needed further imaging today given your recent imaging.  Please continue taking your antibiotics for your infection and your back.  Please follow-up with your orthopedic team, your pain team, your primary doctor.  They will be the ones to prescribe you more pain medication moving forward.  If any symptoms change or worsen, please return to the nearest emergency department.

## 2017-05-22 NOTE — ED Notes (Signed)
Pt called out stating her blood sugar is Low. This RN to check CBG. Pt neurologically intact.

## 2017-05-22 NOTE — ED Triage Notes (Signed)
Presents to the ed with complaints of pain in her back caused from an abscess on her bottom. Reports having it drained twice. Ambulatory.

## 2017-05-22 NOTE — ED Notes (Signed)
PT states understanding of care given, follow up care, and medication prescribed. PT ambulated from ED to car with a steady gait. 

## 2017-05-22 NOTE — ED Notes (Signed)
EDP made aware of pt CBG. Pt provided with orange juice with plan to recheck CBG shortly.

## 2017-05-22 NOTE — ED Notes (Signed)
CBG RECHECK : 69

## 2017-05-22 NOTE — ED Provider Notes (Signed)
[facility] EMERGENCY DEPARTMENT Provider Note   CSN: [id number] Arrival date & time: 05/22/17  1406     History   Chief Complaint Chief Complaint  Patient presents with  . Abscess    HPI [name] is a 27 y.o. female.  The history is provided by the patient, the spouse and medical records.  Back Pain   This is a recurrent problem. The current episode started more than 1 week ago. The problem occurs constantly. The problem has not changed since onset.Associated with: buttock infection. The pain is present in the gluteal region. The quality of the pain is described as aching. The pain is at a severity of 9/10. The pain is severe. The symptoms are aggravated by twisting and certain positions. The pain is the same all the time. Pertinent negatives include no chest pain, no fever, no headaches, no abdominal pain, no abdominal swelling, no bowel incontinence, no perianal numbness, no bladder incontinence, no dysuria, no pelvic pain, no paresthesias, no paresis and no weakness. She has tried nothing for the symptoms. The treatment provided no relief.    Past Medical History:  Diagnosis Date  . Anxiety   . Crohn's disease ([place])   . Diabetes type 1, uncontrolled ([place]) 11/14/2011   Since age 77  . Fibromyalgia   . Gastroparesis   . Hypertension   . Infection    UTI April 2016    Patient Active Problem List   Diagnosis Date Noted  . Intractable nausea and vomiting 04/11/2017  . Nausea and vomiting 04/10/2017  . Essential hypertension   . Colitis   . Crohn's disease ([place]) 04/03/2017  . Abscess and cellulitis of gluteal region 03/24/2017  . Cellulitis and abscess of buttock 03/23/2017  . Malnutrition of moderate degree 03/19/2017  . Anasarca 03/16/2017  . 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 Diarrhea 02/27/2017  . Lower GI bleed 02/27/2017  . DKA (diabetic ketoacidosis) ([place]) 02/26/2017  . Acute metabolic  encephalopathy   . DKA, type 1 ([place]) 06/11/2013  . Orthostatic hypotension dysautonomic syndrome ([place]) 04/28/2013  . Body aches 04/28/2013  . Orthostatic hypotension 04/28/2013  . Diabetes mellitus type 1 ([place]) 04/28/2013  . Protein-calorie malnutrition, severe ([place]) 04/28/2013  . Noncompliance with diabetes treatment 09/01/2012  . Dyspnea 02/28/2012  . Sinus tachycardia 02/28/2012  . Hyperglycemia without ketosis 02/28/2012  . Chest pain 12/04/2011  . Diabetes type 1, uncontrolled ([place]) 11/14/2011  . Hypertension associated with diabetes ([place]) 11/14/2011  . Gastroparesis 11/14/2011  . Hyponatremia 09/19/2011  . Abdominal pain 09/18/2011  . Metabolic alkalosis 71/24/5809  . Diabetic hyperosmolar non-ketotic state ([place]) 09/18/2011  . DIAB W/UNSPEC COMP TYPE I [JUV TYPE] UNCNTRL 06/04/2010  . CONSTIPATION 01/01/2010  . RECTAL PAIN 01/01/2010    Past Surgical History:  Procedure Laterality Date  . ANKLE SURGERY    . CHOLECYSTECTOMY  11/15/2011   Procedure: LAPAROSCOPIC CHOLECYSTECTOMY WITH INTRAOPERATIVE CHOLANGIOGRAM;  Surgeon: [name], MD;  Location: [facility];  Service: General;  Laterality: N/A;  . ESOPHAGOGASTRODUODENOSCOPY  12/03/2011   Procedure: ESOPHAGOGASTRODUODENOSCOPY (EGD);  Surgeon: Beryle Beams, MD;  Location: [facility];  Service: Endoscopy;  Laterality: N/A;  . FLEXIBLE SIGMOIDOSCOPY N/A 03/10/2017   Procedure: FLEXIBLE SIGMOIDOSCOPY;  Surgeon: Carol Ada, MD;  Location: [facility];  Service: Endoscopy;  Laterality: N/A;  . INCISION AND DRAINAGE PERIRECTAL ABSCESS N/A 03/01/2017   Procedure: IRRIGATION AND DEBRIDEMENT PERIRECTAL ABSCESS;  Surgeon: Alphonsa Overall, MD;  Location: [facility];  Service:  General;  Laterality: N/A;  . IRRIGATION AND DEBRIDEMENT BUTTOCKS N/A 03/23/2017   Procedure: IRRIGATION AND DEBRIDEMENT BUTTOCKS, SETON PLACEMENT;  Surgeon: [name], MD;  Location: [facility];  Service: General;  Laterality: N/A;  . LAPAROSCOPY  11/23/2011    Procedure: LAPAROSCOPY DIAGNOSTIC;  Surgeon: Edward Jolly, MD;  Location: [facility];  Service: General;  Laterality: N/A;    OB History    Gravida Para Term Preterm AB Living   1 1   1   1    SAB TAB Ectopic Multiple Live Births         0 1       Home Medications    Prior to Admission medications   Medication Sig Start Date End Date Taking? Authorizing Provider  cholestyramine light (PREVALITE) 4 g packet Take 1 packet (4 g total) by mouth 3 (three) times daily. Patient not taking: Reported on 05/13/2017 03/14/17   Lavina Hamman, MD  citalopram (CELEXA) 20 MG tablet Take 1 tablet (20 mg total) by mouth daily. 05/13/17   Henson, Vickie L, NP-C  clindamycin (CLEOCIN) 300 MG capsule Take 1 capsule (300 mg total) by mouth 4 (four) times daily for 14 days. 05/14/17 05/28/17  McDonald, Mia A, PA-C  cyclobenzaprine (FLEXERIL) 10 MG tablet Take 1 tablet (10 mg total) by mouth 2 (two) times daily as needed for muscle spasms. 05/21/17   Khatri, Hina, PA-C  diphenoxylate-atropine (LOMOTIL) 2.5-0.025 MG tablet Take 2 tablets by mouth 4 (four) times daily. 05/18/17   Henson, Vickie L, NP-C  EPINEPHrine (EPIPEN 2-PAK) 0.3 mg/0.3 mL IJ SOAJ injection Inject 0.3 mLs (0.3 mg total) into the muscle as needed (allergic reaction). 03/21/17   Aline August, MD  ferrous sulfate 325 (65 FE) MG tablet Take 1 tablet (325 mg total) by mouth 2 (two) times daily with a meal. 05/13/17   Henson, Vickie L, NP-C  folic acid (FOLVITE) 1 MG tablet Take 1 tablet (1 mg total) by mouth daily. 05/13/17   Henson, Vickie L, NP-C  furosemide (LASIX) 40 MG tablet Take 1 tablet (40 mg total) by mouth daily. 05/13/17 06/12/17  Henson, Vickie L, NP-C  gabapentin (NEURONTIN) 300 MG capsule Take 1 capsule (300 mg total) by mouth 2 (two) times daily. 04/03/17   Henson, Vickie L, NP-C  glucose 4 GM chewable tablet Chew 1 tablet (4 g total) by mouth as needed for low blood sugar. 08/10/14   Kandis Cocking A, CNM  ibuprofen (ADVIL,MOTRIN) 800  MG tablet Take 800 mg by mouth 3 (three) times daily. 04/27/17   [provider]  insulin lispro (HUMALOG) 100 UNIT/ML injection Inject 0.02 mLs (2 Units total) into the skin 3 (three) times daily with meals. Patient taking differently: Inject 1-9 Units into the skin 3 (three) times daily with meals. Sliding scale 04/09/17   Renato Shin, MD  LANTUS 100 UNIT/ML injection Inject 0.18 mLs (18 Units total) into the skin every morning. 05/07/17   Renato Shin, MD  lidocaine (LIDODERM) 5 % Place 1 patch onto the skin daily. Remove & Discard patch within 12 hours or as directed by MD 05/21/17   Delia Heady, PA-C  lipase/protease/amylase (CREON) 36000 UNITS CPEP capsule Take 1 capsule (36,000 Units total) by mouth 3 (three) times daily with meals. 03/14/17   Lavina Hamman, MD  loperamide (IMODIUM) 2 MG capsule Take 1 capsule (2 mg total) by mouth 3 (three) times daily before meals. DO NOT TAKE IT FOR THE DAY YOU DONT HAVE A BOWEL MOVEMENT.  04/03/17   Henson, Vickie L, NP-C  methocarbamol (ROBAXIN) 500 MG tablet Take 1 tablet (500 mg total) by mouth every 8 (eight) hours as needed for muscle spasms. 03/14/17   Lavina Hamman, MD  metoCLOPramide (REGLAN) 5 MG tablet Take 5 mg by mouth 4 (four) times daily -  before meals and at bedtime.    [provider]  oxyCODONE-acetaminophen (PERCOCET/ROXICET) 5-325 MG tablet Take 1 tablet by mouth every 8 (eight) hours as needed for severe pain. Patient not taking: Reported on 05/21/2017 05/14/17   Joline Maxcy A, PA-C  promethazine (PHENERGAN) 12.5 MG tablet Take 1 tablet (12.5 mg total) by mouth every 6 (six) hours as needed for nausea or vomiting. 05/13/17   Girtha Rm, NP-C    Family History Family History  Problem Relation Age of Onset  . Diabetes Mother   . Hypertension Father     Social History Social History   Tobacco Use  . Smoking status: Never Smoker  . Smokeless tobacco: Never Used  Substance Use Topics  . Alcohol use: No     Alcohol/week: 0.0 oz    Frequency: Never  . Drug use: No     Allergies   Peanut-containing drug products and Lactose intolerance (gi)   Review of Systems Review of Systems  Constitutional: Negative for chills, diaphoresis, fatigue and fever.  HENT: Negative for congestion.   Respiratory: Negative for cough, chest tightness, shortness of breath, wheezing and stridor.   Cardiovascular: Negative for chest pain.  Gastrointestinal: Negative for abdominal pain, bowel incontinence, constipation, diarrhea and nausea.  Genitourinary: Negative for bladder incontinence, dysuria, flank pain, frequency and pelvic pain.  Musculoskeletal: Positive for back pain. Negative for neck pain and neck stiffness.  Skin: Positive for wound. Negative for rash.  Neurological: Negative for weakness, light-headedness, headaches and paresthesias.  Psychiatric/Behavioral: Negative for agitation and confusion.  All other systems reviewed and are negative.    Physical Exam Updated Vital Signs BP 116/79 (BP Location: Right Arm)   Pulse 95   Temp 98.2 F (36.8 C)   Resp 16   SpO2 99%   Physical Exam  Constitutional: She is oriented to person, place, and time. She appears well-developed and well-nourished. No distress.  HENT:  Head: Normocephalic and atraumatic.  Right Ear: External ear normal.  Left Ear: External ear normal.  Nose: Nose normal.  Mouth/Throat: Oropharynx is clear and moist. No oropharyngeal exudate.  Eyes: Conjunctivae and EOM are normal. Pupils are equal, round, and reactive to light.  Neck: Normal range of motion. Neck supple.  Cardiovascular: Normal rate.  No murmur heard. Pulmonary/Chest: No stridor. No respiratory distress.  Abdominal: She exhibits no distension. There is no tenderness. There is no rebound.  Musculoskeletal: She exhibits tenderness. She exhibits no edema or deformity.       Legs: Neurological: She is alert and oriented to person, place, and time. She has normal  reflexes. No sensory deficit. She exhibits normal muscle tone. Coordination normal.  Skin: Skin is warm. Capillary refill takes less than 2 seconds. No rash noted. She is not diaphoretic. No erythema.  Psychiatric: She has a normal mood and affect.  Nursing note and vitals reviewed.    ED Treatments / Results  Labs (all labs ordered are listed, but only abnormal results are displayed) Labs Reviewed  COMPREHENSIVE METABOLIC PANEL - Abnormal; Notable for the following components:      Result Value   Glucose, Bld 116 (*)    Albumin 3.4 (*)  Total Bilirubin 0.2 (*)    All other components within normal limits  CBC WITH DIFFERENTIAL/PLATELET - Abnormal; Notable for the following components:   Hemoglobin 11.3 (*)    HCT 33.5 (*)    RDW 11.4 (*)    All other components within normal limits  URINALYSIS, ROUTINE W REFLEX MICROSCOPIC - Abnormal; Notable for the following components:   Color, Urine STRAW (*)    Glucose, UA 50 (*)    Hgb urine dipstick SMALL (*)    Bacteria, UA RARE (*)    All other components within normal limits  CBG MONITORING, ED - Abnormal; Notable for the following components:   Glucose-Capillary 54 (*)    All other components within normal limits  I-STAT CG4 LACTIC ACID, ED  I-STAT BETA HCG BLOOD, ED ([facility], [facility], [facility] ONLY)  I-STAT CG4 LACTIC ACID, ED  CBG MONITORING, ED  CBG MONITORING, ED    EKG  EKG Interpretation None       Radiology No results found.  Procedures Procedures (including critical care time)  Medications Ordered in ED Medications  oxyCODONE-acetaminophen (PERCOCET/ROXICET) 5-325 MG per tablet 1 tablet (not administered)     Initial Impression / Assessment and Plan / ED Course  I have reviewed the triage vital signs and the nursing notes.  Pertinent labs & imaging results that were available during my care of the patient were reviewed by me and considered in my medical decision making (see chart for details).     [name]  is a 27 y.o. 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  Patient reports that she has had chronic back pain for the last few months ever since she has been dealing with abscess on her gluteal area.  She says that she has had continued infection and drainage in this location.  She reports that she is currently being managed by her PCP and is being referred to a pain specialist and orthopedist for further management.  Reports she is still taking her clindamycin and otherwise denies any fevers or chills.  She denies nausea vomiting urinary or GI symptoms.  She reports worsened pain in her back which she thinks is due to how she has to sit due to the infection.  She reports the pain is moderate to severe and denies other complaints.  On exam, patient had clear lungs and nontender abdomen.  Back nontender.  Gluteal exam was performed with nursing present to chaperone revealing indurated area that did not appear fluctuant erythematous or actively having drainage.  This wound was examined by me several weeks ago and appears similar.  Exam otherwise unremarkable.  Patient had laboratory testing which was overall reassuring.  Do not feel patient needs further imaging at this time as it appears similar to before.  Suspect patient has chronic pain from her ongoing infection.  Patient was given a Percocet that he reports he no longer has any medication for this.  She reports that she is going to see her doctor this week for further management.  As I do not feel patient needs any further imaging or admission and does not need escalation of antibiotics because it appears well, patient will be given a prescription for several pills of pain medicine and instructed to follow-up for further management.  Patient advised that the emergency department is  not the place for  her to get further pain medications but her outpatient team is where she needs to get the prescription.  Patient voiced understanding of the plan of care as well as return precautions.  Patient discharged in good condition.     Final Clinical Impressions(s) / ED Diagnoses   Final diagnoses:  Abscess  Low back pain without sciatica, unspecified back pain laterality, unspecified chronicity    ED Discharge Orders        Ordered    oxyCODONE-acetaminophen (PERCOCET/ROXICET) 5-325 MG tablet  Every 4 hours PRN     05/22/17 2209     Clinical Impression: 1. Abscess   2. Low back pain without sciatica, unspecified back pain laterality, unspecified chronicity     Disposition: Discharge  Condition: Good  I have discussed the results, Dx and Tx plan with the pt(& family if present). He/she/they expressed understanding and agree(s) with the plan. Discharge instructions discussed at great length. Strict return precautions discussed and pt &/or family have verbalized understanding of the instructions. No further questions at time of discharge.    Discharge Medication List as of 05/22/2017 10:10 PM    START taking these medications   Details  !! oxyCODONE-acetaminophen (PERCOCET/ROXICET) 5-325 MG tablet Take 1 tablet by mouth every 4 (four) hours as needed for severe pain., Starting Fri 05/22/2017, Print     !! - Potential duplicate medications found. Please discuss with provider.      Follow Up: Girtha Rm, NP-C [place] [address] [phone]     [facility] EMERGENCY DEPARTMENT [address] [id number] mc [place]       Jaziyah Gradel, Gwenyth Allegra, MD 05/23/17 [id number]

## 2017-05-24 ENCOUNTER — Encounter: Payer: Self-pay | Admitting: Family Medicine

## 2017-05-27 ENCOUNTER — Emergency Department (HOSPITAL_COMMUNITY): Payer: 59

## 2017-05-27 ENCOUNTER — Emergency Department (HOSPITAL_COMMUNITY)
Admission: EM | Admit: 2017-05-27 | Discharge: 2017-05-27 | Disposition: A | Discharge status: Home / Self Care | Payer: 59 | Attending: Emergency Medicine | Admitting: Emergency Medicine

## 2017-05-27 ENCOUNTER — Encounter (HOSPITAL_COMMUNITY): Payer: Self-pay | Admitting: Emergency Medicine

## 2017-05-27 DIAGNOSIS — R0789 Other chest pain: Secondary | ICD-10-CM | POA: Diagnosis not present

## 2017-05-27 DIAGNOSIS — G8929 Other chronic pain: Secondary | ICD-10-CM

## 2017-05-27 DIAGNOSIS — Z794 Long term (current) use of insulin: Secondary | ICD-10-CM | POA: Diagnosis not present

## 2017-05-27 DIAGNOSIS — M5410 Radiculopathy, site unspecified: Secondary | ICD-10-CM

## 2017-05-27 DIAGNOSIS — R109 Unspecified abdominal pain: Secondary | ICD-10-CM | POA: Diagnosis not present

## 2017-05-27 DIAGNOSIS — M7918 Myalgia, other site: Secondary | ICD-10-CM | POA: Diagnosis not present

## 2017-05-27 DIAGNOSIS — E1010 Type 1 diabetes mellitus with ketoacidosis without coma: Secondary | ICD-10-CM | POA: Diagnosis not present

## 2017-05-27 DIAGNOSIS — Z79899 Other long term (current) drug therapy: Secondary | ICD-10-CM | POA: Insufficient documentation

## 2017-05-27 DIAGNOSIS — I10 Essential (primary) hypertension: Secondary | ICD-10-CM | POA: Diagnosis not present

## 2017-05-27 DIAGNOSIS — R079 Chest pain, unspecified: Secondary | ICD-10-CM | POA: Diagnosis not present

## 2017-05-27 DIAGNOSIS — Z91010 Allergy to peanuts: Secondary | ICD-10-CM | POA: Insufficient documentation

## 2017-05-27 DIAGNOSIS — K838 Other specified diseases of biliary tract: Secondary | ICD-10-CM | POA: Diagnosis not present

## 2017-05-27 LAB — COMPREHENSIVE METABOLIC PANEL
ALT: 25 U/L (ref 14–54)
AST: 24 U/L (ref 15–41)
Albumin: 3.9 g/dL (ref 3.5–5.0)
Alkaline Phosphatase: 87 U/L (ref 38–126)
Anion gap: 11 (ref 5–15)
BUN: 10 mg/dL (ref 6–20)
CO2: 32 mmol/L (ref 22–32)
Calcium: 9.8 mg/dL (ref 8.9–10.3)
Chloride: 98 mmol/L — ABNORMAL LOW (ref 101–111)
Creatinine, Ser: 0.48 mg/dL (ref 0.44–1.00)
GFR calc Af Amer: >60 mL/min (ref >60)
GFR calc non Af Amer: >60 mL/min (ref >60)
Glucose, Bld: 287 mg/dL — ABNORMAL HIGH (ref 65–99)
Potassium: 4.3 mmol/L (ref 3.5–5.1)
Sodium: 141 mmol/L (ref 135–145)
Total Bilirubin: 0.6 mg/dL (ref 0.3–1.2)
Total Protein: 8.1 g/dL (ref 6.5–8.1)

## 2017-05-27 LAB — CBC
HCT: 36.3 % (ref 36.0–46.0)
Hemoglobin: 12.4 g/dL (ref 12.0–15.0)
MCH: 28.6 pg (ref 26.0–34.0)
MCHC: 34.2 g/dL (ref 30.0–36.0)
MCV: 83.6 fL (ref 78.0–100.0)
Platelets: 221 10*3/uL (ref 150–400)
RBC: 4.34 MIL/uL (ref 3.87–5.11)
RDW: 11.8 % (ref 11.5–15.5)
WBC: 4.9 10*3/uL (ref 4.0–10.5)

## 2017-05-27 LAB — I-STAT TROPONIN, ED: Troponin i, poc: 0 ng/mL (ref 0.00–0.08)

## 2017-05-27 LAB — URINALYSIS, ROUTINE W REFLEX MICROSCOPIC
Bilirubin Urine: NEGATIVE
Glucose, UA: 50 mg/dL — AB
Ketones, ur: NEGATIVE mg/dL
Leukocytes, UA: NEGATIVE
Nitrite: NEGATIVE
Protein, ur: 30 mg/dL — AB
Specific Gravity, Urine: 1.011 (ref 1.005–1.030)
pH: 7 (ref 5.0–8.0)

## 2017-05-27 LAB — I-STAT BETA HCG BLOOD, ED (MC, WL, AP ONLY): I-stat hCG, quantitative: <5 m[IU]/mL (ref <5)

## 2017-05-27 LAB — LIPASE, BLOOD: Lipase: 19 U/L (ref 11–51)

## 2017-05-27 IMAGING — CR DG CHEST 2V
2 series · 2 of 2 positions shown · non-contrast
Comparison: PA and lateral chest [DATE] and [DATE].

CLINICAL DATA: Back pain radiating into the right leg. Central
chest pain with nausea and vomiting beginning this morning.

EXAM:
CHEST  2 VIEW

[w chest pa]
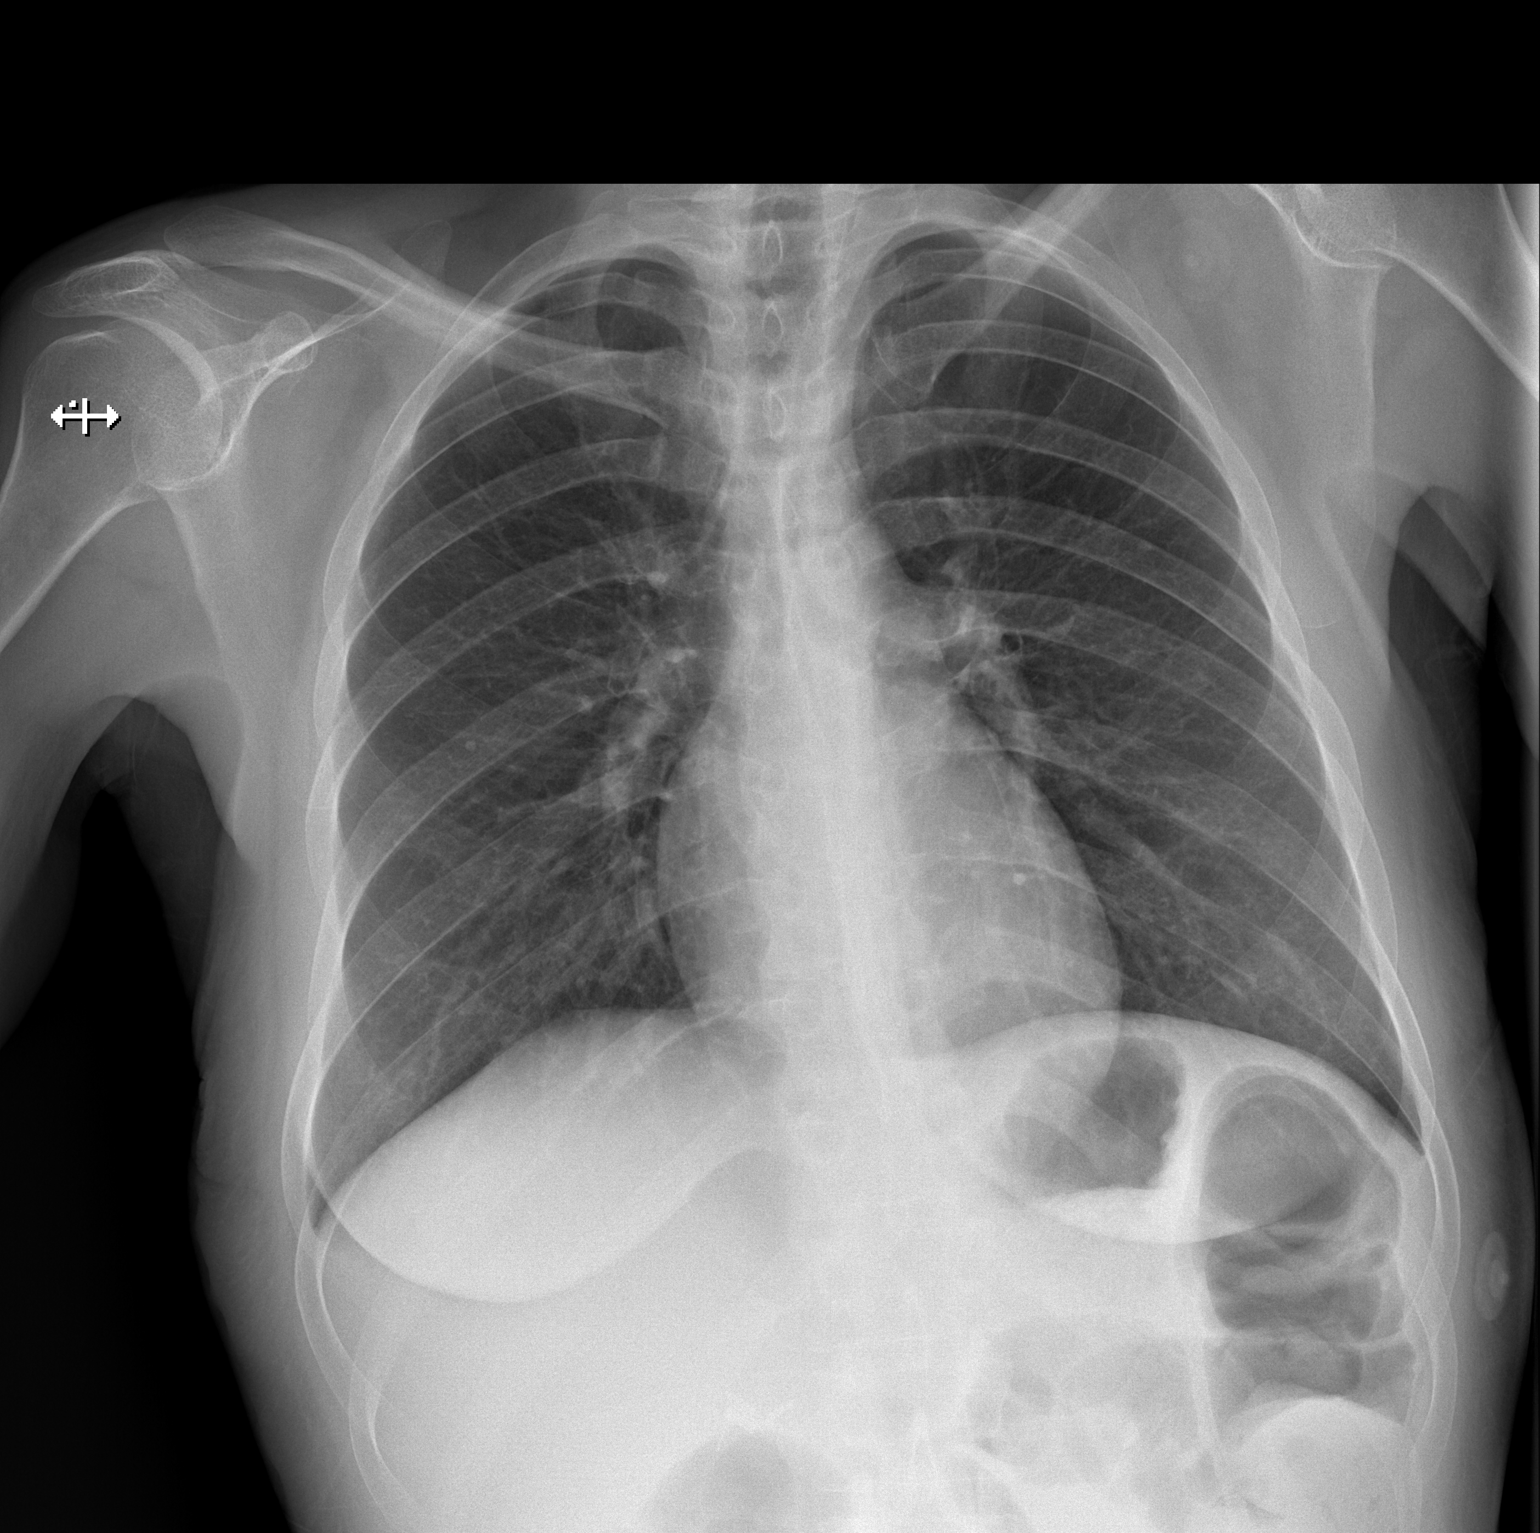

[w chest lat]
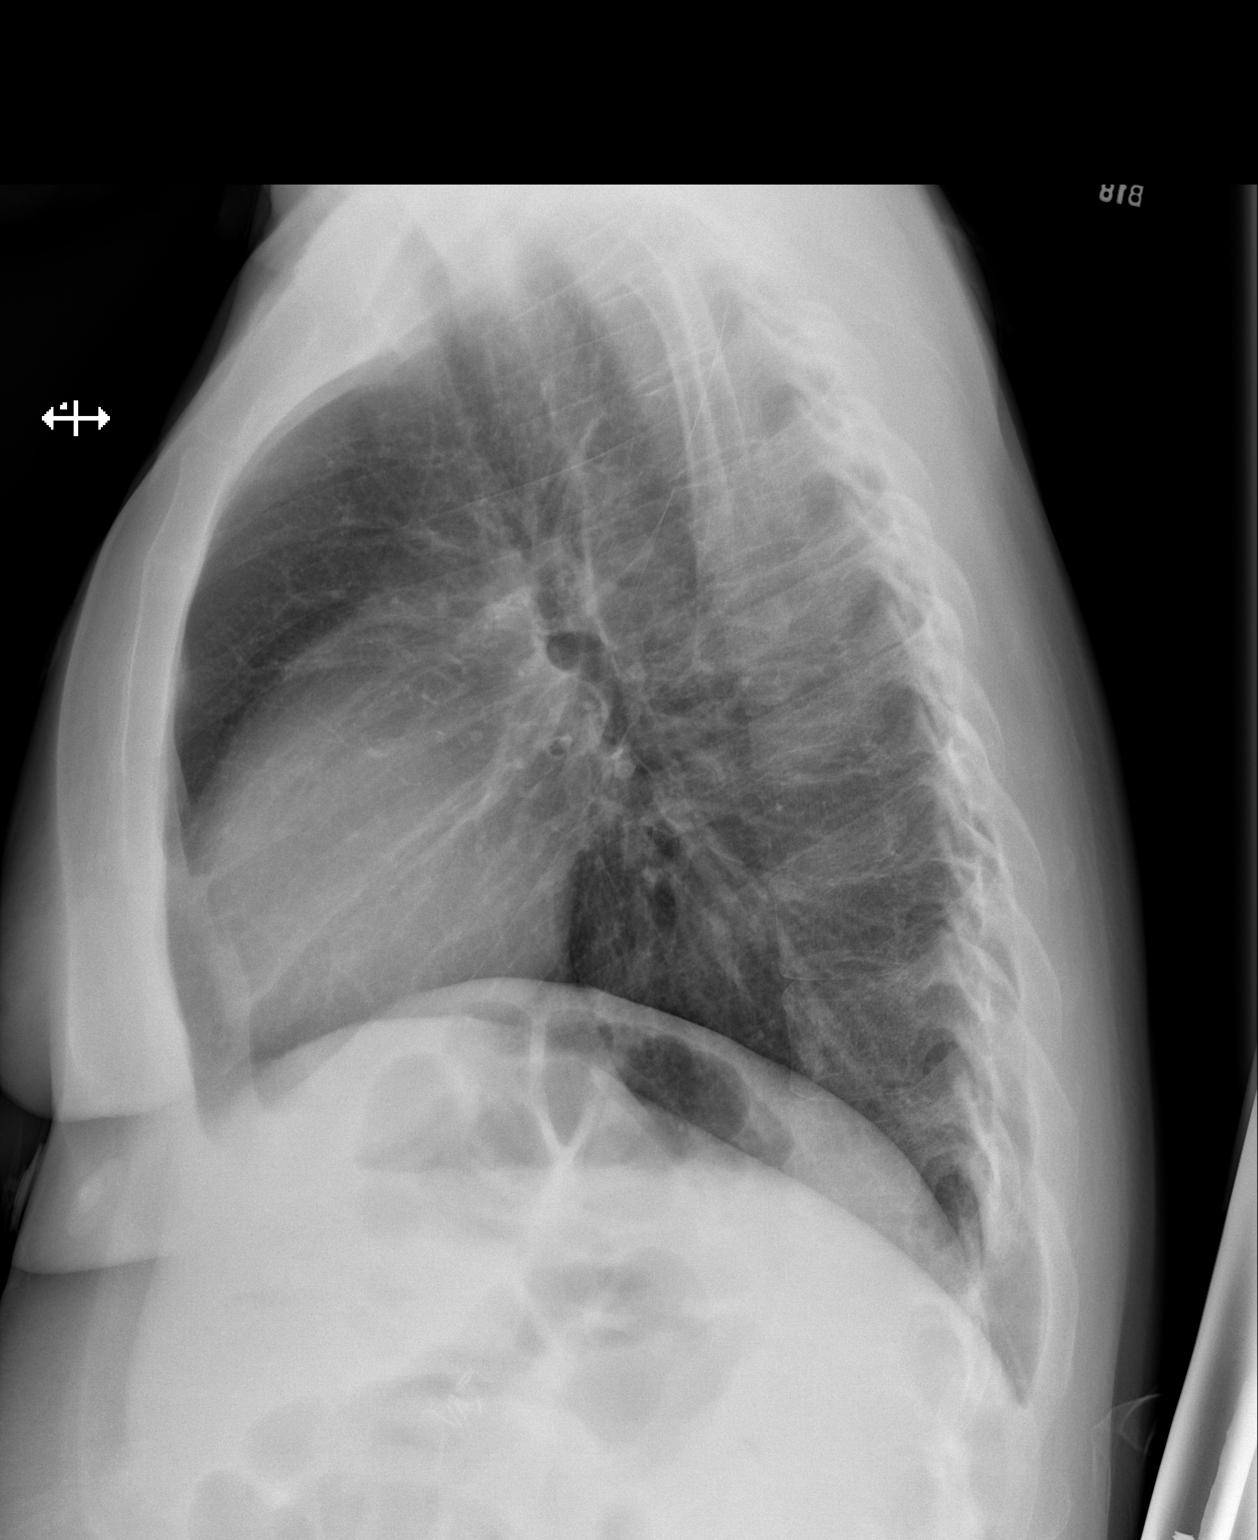

[2 of 2 positions shown; findings below may reference images not displayed]

FINDINGS: Lungs are clear. Heart size is normal. No pneumothorax or pleural
fluid. No bony abnormality.
IMPRESSION: Negative chest.

## 2017-05-27 IMAGING — CT CT ABD-PELV W/ CM
3 of 13 series · 11 of 46 positions shown, 17 images · IV contrast (ISOVUE)
Comparison: CT abdomen and pelvis [DATE]

CLINICAL DATA: Chest pain and abdominal pain. Fever. Crohn's
disease.

EXAM:
CT ANGIOGRAPHY CHEST
CT ABDOMEN AND PELVIS WITH CONTRAST
TECHNIQUE: Multidetector CT imaging of the chest was performed using the
standard protocol during bolus administration of intravenous
contrast. Multiplanar CT image reconstructions and MIPs were
obtained to evaluate the vascular anatomy. Multidetector CT imaging
of the abdomen and pelvis was performed using the standard protocol
during bolus administration of intravenous contrast.
CONTRAST:  100mL [XQ] IOPAMIDOL ([XQ]) INJECTION 76%

[Series 4: axial st · axial · 0.71mm/px · z∈[-568,-303]mm · 4 of 89 slices shown, 9 images]
[im 18/89  soft-tissue]
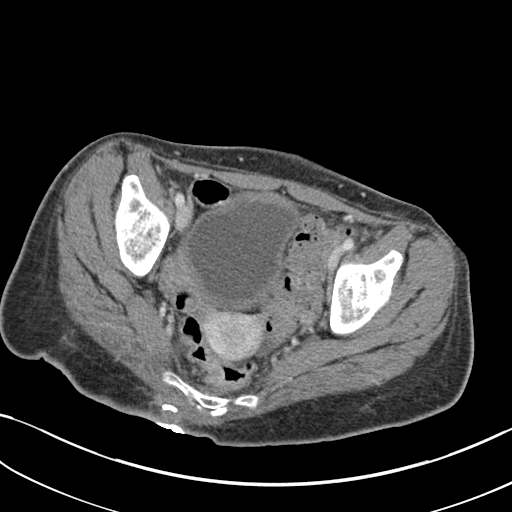
[im 18/89  lung]
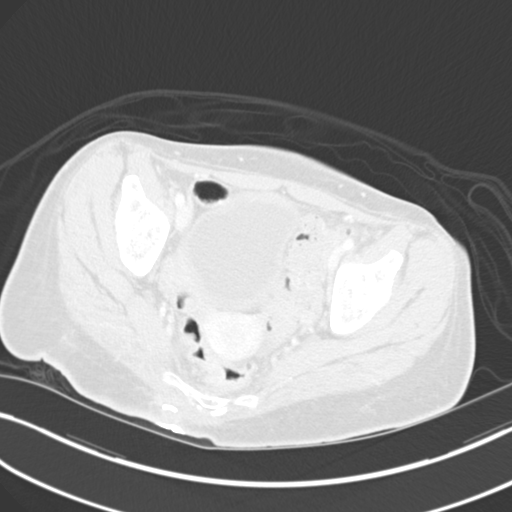
[im 18/89  bone]
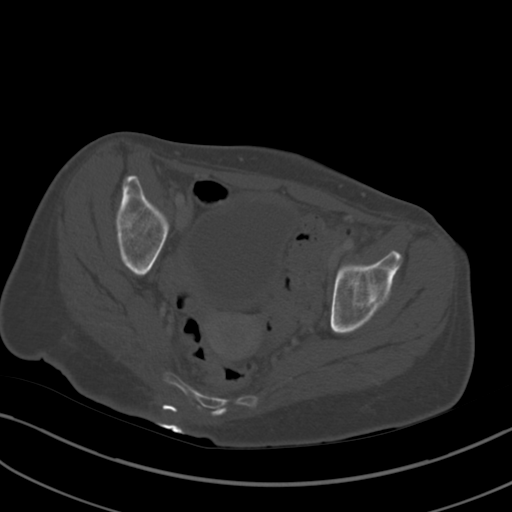
[im 36/89  soft-tissue]
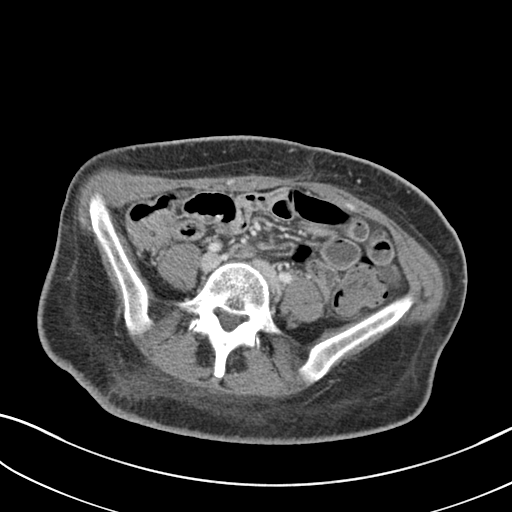
[im 36/89  lung]
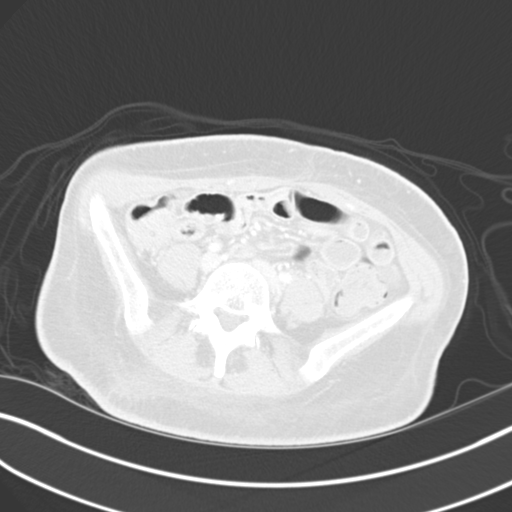
[im 53/89  soft-tissue]
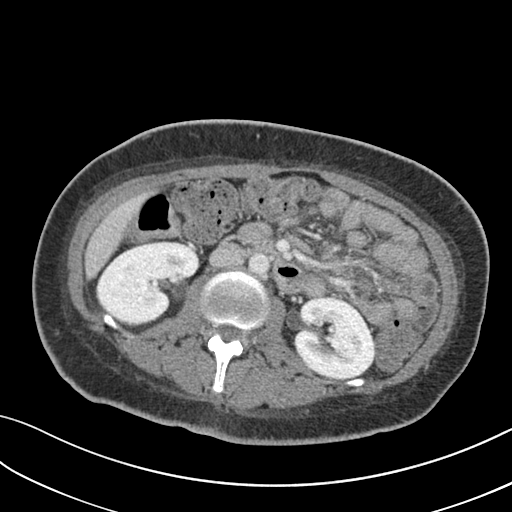
[im 53/89  lung]
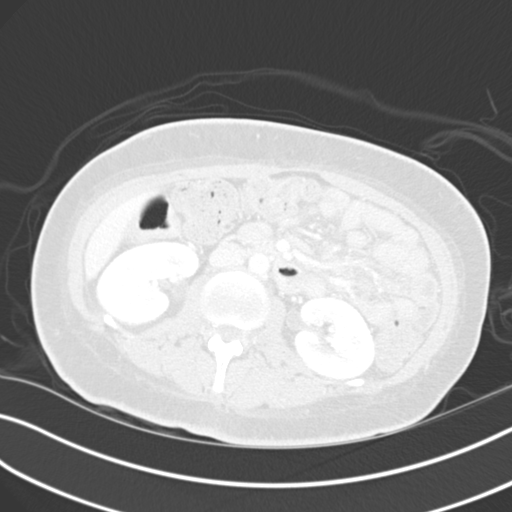
[im 71/89  soft-tissue]
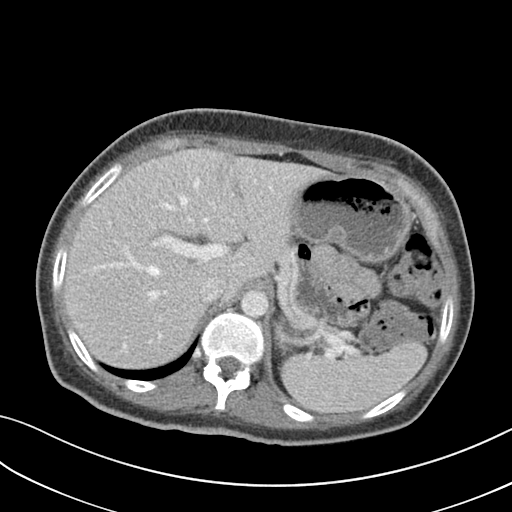
[im 71/89  lung]
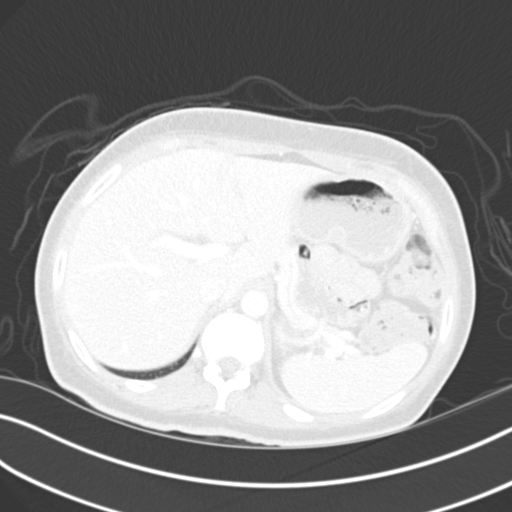

[Series 9: thins · axial · 0.64mm/px · z∈[-308,-144]mm · 6 of 275 slices shown]
[im 19/275  soft-tissue]
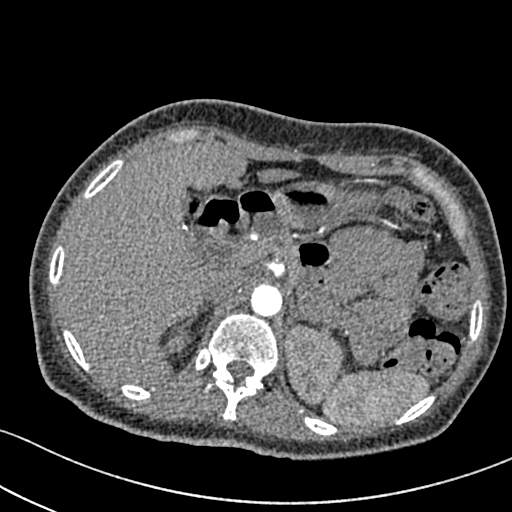
[im 55/275  soft-tissue]
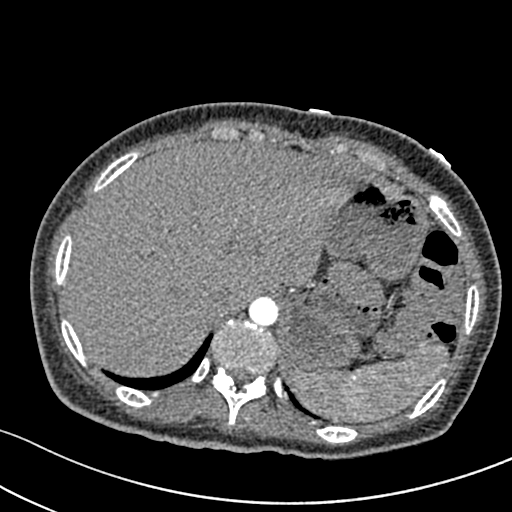
[im 92/275  soft-tissue]
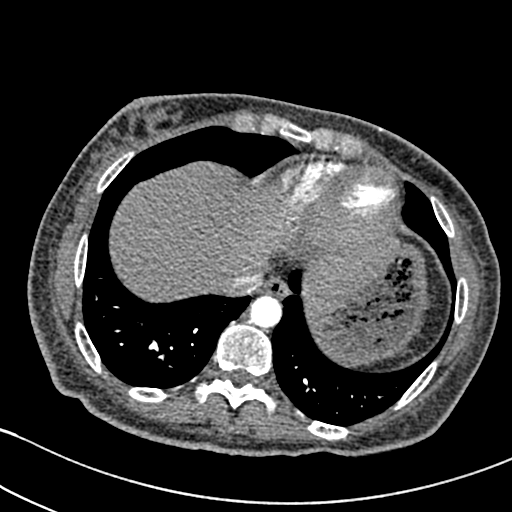
[im 128/275  soft-tissue]
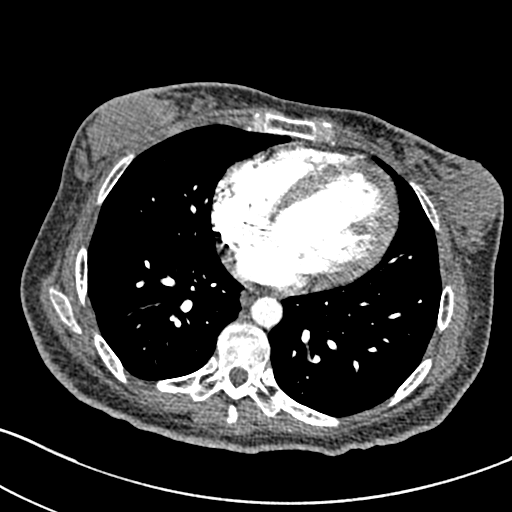
[im 147/275  soft-tissue]
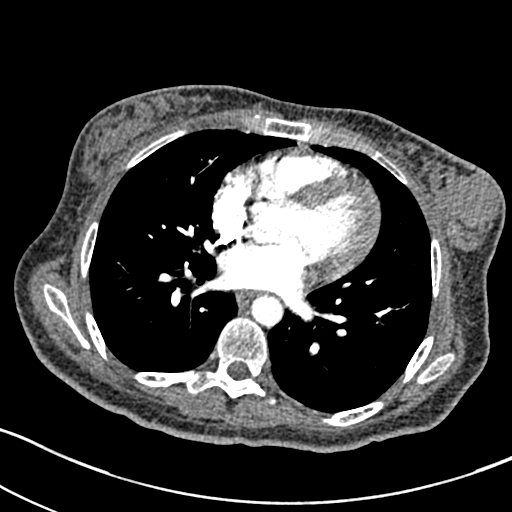
[im 183/275  soft-tissue]
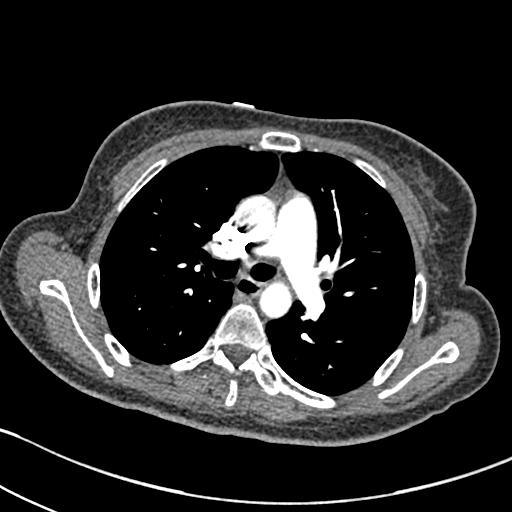

[Series 11: coronal mpr · coronal · 0.54mm/px · 1 of 118 slices shown, 2 images]
[im 59/118  soft-tissue]
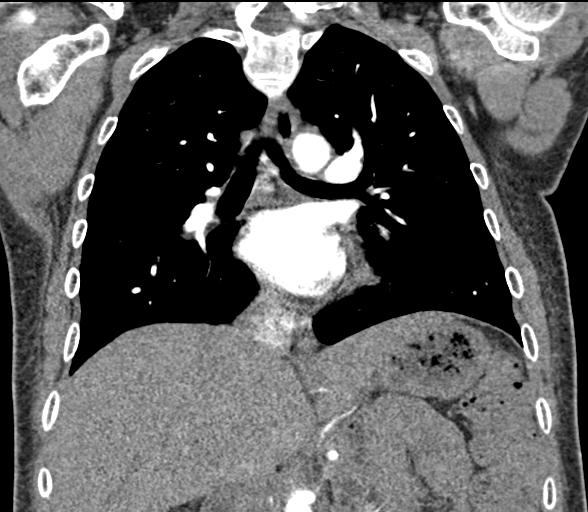
[im 59/118  bone]
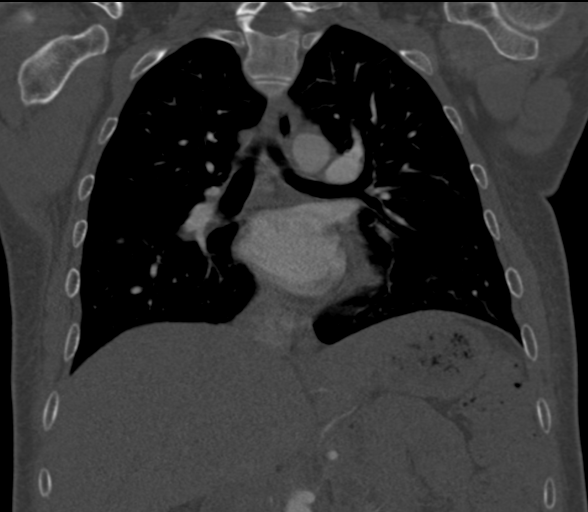

[11 of 46 positions shown; findings below may reference images not displayed]

FINDINGS: CTA CHEST FINDINGS

Cardiovascular: There is no demonstrable pulmonary embolus. There is
no thoracic aortic aneurysm or dissection. The visualized great
vessels appear unremarkable. There is no pericardial effusion or
pericardial thickening.

Mediastinum/Nodes: Visualized thyroid appears normal. There is no
appreciable thoracic adenopathy. No esophageal lesions are evident.

Lungs/Pleura: Lungs are clear. No pleural effusion or pleural
thickening evident.

Musculoskeletal: No blastic or lytic bone lesions are evident. There
is an apparent bone island in the rightward aspect of the T6
vertebral body.

Review of the MIP images confirms the above findings.

CT ABDOMEN and PELVIS FINDINGS

Hepatobiliary: No focal liver lesions are evident. Gallbladder is
absent. There is dilatation of the common hepatic duct to 11 mm with
tapering of the common bile duct. No biliary duct mass or calculus.
There is borderline intrahepatic biliary duct dilatation, a likely a
consequence of post cholecystectomy state.

Pancreas: No pancreatic mass or inflammatory focus is evident.
Pancreatic duct is upper normal in size.

Spleen: No splenic lesions are evident.

Adrenals/Urinary Tract: Adrenals bilaterally appear normal. Kidneys
bilaterally show no evident mass or hydronephrosis on either side.
There is no renal or ureteral calculus on either side. Urinary
bladder is midline with wall thickness within normal limits.

Stomach/Bowel: There is stool in fluid throughout the colon. There
is no appreciable bowel wall or mesenteric thickening. No bowel
obstruction is evident. No free air or portal venous air is evident.
There is no appreciable within the abdomen or pelvis.

There is a suspected anocutaneous fistula in the posterior perineum
as suggested on recent CT. This appearance is unchanged compared to
the prior examination.

Vascular/Lymphatic: No abdominal aortic aneurysm. No vascular
lesions are evident. There is no adenopathy in the abdomen or
pelvis.

Reproductive: Uterus is in the midline.  No pelvic mass.

Other: Appendix appears normal. No ascites or abscess is seen within
the peritoneum or retroperitoneum.

Musculoskeletal: Surgical drains are again noted in the subcutaneous
tissues posterior to the sacrum on the right and medial gluteal
muscles on the right. No well-defined fluid collection is seen in
these areas of inflammation. There remains soft tissue thickening in
the gluteal cleft region.

No blastic or lytic bone lesions identified. No osteomyelitis. No
intramuscular lesion evident.

Review of the MIP images confirms the above findings.
IMPRESSION: CT angiogram chest:

1. No demonstrable pulmonary embolus. No thoracic aortic aneurysm or
dissection.

2.  Lungs clear.

3.  No evident adenopathy.

CT abdomen and pelvis:

1. Suspect posterior anocutaneous fistula, similar in appearance to
most recent study.

2. Surgical drain is noted posterior to the sacrum and gluteal
muscles on the right with inflammatory change but no well-defined
abscess or fluid. Thickening at the gluteal crease remains.

3. No bowel wall thickening or abscess. No bowel obstruction.
Appendix appears unremarkable.

4. Gallbladder absent. Mild dilatation of the common hepatic duct
with tapering of common bile. Borderline intrahepatic biliary duct
dilatation. No biliary duct mass or calculus.

## 2017-05-27 MED ORDER — MORPHINE SULFATE (PF) 4 MG/ML IV SOLN
4.0000 mg | Freq: Once | INTRAVENOUS | Status: AC
  Administered 2017-05-27: 4 mg via INTRAVENOUS
  Filled 2017-05-27: qty 1

## 2017-05-27 MED ORDER — SODIUM CHLORIDE 0.9 % IJ SOLN
INTRAMUSCULAR | Status: AC
  Filled 2017-05-27: qty 50

## 2017-05-27 MED ORDER — ONDANSETRON 4 MG PO TBDP
4.0000 mg | ORAL_TABLET | Freq: Once | ORAL | Status: AC | PRN
  Administered 2017-05-27: 4 mg via ORAL
  Filled 2017-05-27: qty 1

## 2017-05-27 MED ORDER — SODIUM CHLORIDE 0.9 % IV BOLUS (SEPSIS)
1000.0000 mL | Freq: Once | INTRAVENOUS | Status: AC
  Administered 2017-05-27: 1000 mL via INTRAVENOUS

## 2017-05-27 MED ORDER — IOPAMIDOL (ISOVUE-370) INJECTION 76%
INTRAVENOUS | Status: AC
  Administered 2017-05-27: 100 mL via INTRAVENOUS
  Filled 2017-05-27: qty 100

## 2017-05-27 NOTE — ED Provider Notes (Signed)
[facility]-EMERGENCY DEPT Provider Note   CSN: [id number] Arrival date & time: 05/27/17  1533     History   Chief Complaint Chief Complaint  Patient presents with  . Back Pain  . Chest Pain    HPI [name] is a 27 y.o. female.  HPI Reports ongoing back pain since January related to perirectal abscesses, then Monday developed leg pain on right leg, up and down leg, aching pain. If moves a certain way will have more pain around abscess.  Has had pain in that area before, but has not had pain like this since the last time the abscess worsened. Reports the pain was more pronounced, even present at rest.  Right leg with numbness and weakness since Monday.  Reports able to walk, but that it is more pain, and if she walks a long time she has to rest.  No fevers.  No urinary symptoms, no constipation or diarrhea. Denies loss of control of bowel or bladder, history of falls or trauma.Patient also reports she had chest pain since this morning, with associated dyspnea and lightheadedness.  She has no history of PE or DVT, no asymmetric leg swelling, but does have a history of recent surgeries and hospitalizations.  Reports the chest pain feels like a pressure, and has been constant since early this morning.      Past Medical History:  Diagnosis Date  . Anxiety   . Crohn's disease (HCC)   . Diabetes type 1, uncontrolled (HCC) 11/14/2011   Since age 63  . Fibromyalgia   . Gastroparesis   . Hypertension   . Infection    UTI April 2016    Patient Active Problem List   Diagnosis Date Noted  . Intractable nausea and vomiting 04/11/2017  . Nausea and vomiting 04/10/2017  . Essential hypertension   . Colitis   . Crohn's disease (HCC) 04/03/2017  . Abscess and cellulitis of gluteal region 03/24/2017  . Cellulitis and abscess of buttock 03/23/2017  . Malnutrition of moderate degree 03/19/2017  . Anasarca 03/16/2017  . MDD (major depressive disorder), recurrent  episode, mild (HCC) 02/28/2017  . Diarrhea 02/27/2017  . Lower GI bleed 02/27/2017  . DKA (diabetic ketoacidosis) (HCC) 02/26/2017  . Acute metabolic encephalopathy   . DKA, type 1 (HCC) 06/11/2013  . Orthostatic hypotension dysautonomic syndrome (HCC) 04/28/2013  . Body aches 04/28/2013  . Orthostatic hypotension 04/28/2013  . Diabetes mellitus type 1 (HCC) 04/28/2013  . Protein-calorie malnutrition, severe (HCC) 04/28/2013  . Noncompliance with diabetes treatment 09/01/2012  . Dyspnea 02/28/2012  . Sinus tachycardia 02/28/2012  . Hyperglycemia without ketosis 02/28/2012  . Chest pain 12/04/2011  . Diabetes type 1, uncontrolled (HCC) 11/14/2011  . Hypertension associated with diabetes (HCC) 11/14/2011  . Gastroparesis 11/14/2011  . Hyponatremia 09/19/2011  . Abdominal pain 09/18/2011  . Metabolic alkalosis 09/18/2011  . Diabetic hyperosmolar non-ketotic state (HCC) 09/18/2011  . DIAB W/UNSPEC COMP TYPE I [JUV TYPE] UNCNTRL 06/04/2010  . CONSTIPATION 01/01/2010  . RECTAL PAIN 01/01/2010    Past Surgical History:  Procedure Laterality Date  . ANKLE SURGERY    . CHOLECYSTECTOMY  11/15/2011   Procedure: LAPAROSCOPIC CHOLECYSTECTOMY WITH INTRAOPERATIVE CHOLANGIOGRAM;  Surgeon: [name], MD;  Location: [facility];  Service: General;  Laterality: N/A;  . ESOPHAGOGASTRODUODENOSCOPY  12/03/2011   Procedure: ESOPHAGOGASTRODUODENOSCOPY (EGD);  Surgeon: Theda Belfast, MD;  Location: [facility];  Service: Endoscopy;  Laterality: N/A;  . FLEXIBLE SIGMOIDOSCOPY N/A 03/10/2017   Procedure: FLEXIBLE SIGMOIDOSCOPY;  Surgeon: Jeani Hawking, MD;  Location: [facility];  Service: Endoscopy;  Laterality: N/A;  . INCISION AND DRAINAGE PERIRECTAL ABSCESS N/A 03/01/2017   Procedure: IRRIGATION AND DEBRIDEMENT PERIRECTAL ABSCESS;  Surgeon: Ovidio Kin, MD;  Location: [facility];  Service: General;  Laterality: N/A;  . IRRIGATION AND DEBRIDEMENT BUTTOCKS N/A 03/23/2017   Procedure: IRRIGATION AND  DEBRIDEMENT BUTTOCKS, SETON PLACEMENT;  Surgeon: Romie Levee, MD;  Location: [facility];  Service: General;  Laterality: N/A;  . LAPAROSCOPY  11/23/2011   Procedure: LAPAROSCOPY DIAGNOSTIC;  Surgeon: Mariella Saa, MD;  Location: [facility];  Service: General;  Laterality: N/A;    OB History    Gravida Para Term Preterm AB Living   1 1   1   1    SAB TAB Ectopic Multiple Live Births         0 1       Home Medications    Prior to Admission medications   Medication Sig Start Date End Date Taking? Authorizing Provider  acetaminophen (TYLENOL) 500 MG tablet Take 2,000 mg by mouth daily as needed for moderate pain.   Yes [provider]  citalopram (CELEXA) 20 MG tablet Take 1 tablet (20 mg total) by mouth daily. 05/13/17  Yes Henson, Vickie L, NP-C  clindamycin (CLEOCIN) 300 MG capsule Take 1 capsule (300 mg total) by mouth 4 (four) times daily for 14 days. 05/14/17 05/28/17 Yes McDonald, Mia A, PA-C  cyclobenzaprine (FLEXERIL) 10 MG tablet Take 1 tablet (10 mg total) by mouth 2 (two) times daily as needed for muscle spasms. 05/21/17  Yes Khatri, Hina, PA-C  diphenoxylate-atropine (LOMOTIL) 2.5-0.025 MG tablet Take 2 tablets by mouth 4 (four) times daily. 05/18/17  Yes Henson, Vickie L, NP-C  ferrous sulfate 325 (65 FE) MG tablet Take 1 tablet (325 mg total) by mouth 2 (two) times daily with a meal. 05/13/17  Yes Henson, Vickie L, NP-C  folic acid (FOLVITE) 1 MG tablet Take 1 tablet (1 mg total) by mouth daily. 05/13/17  Yes Henson, Vickie L, NP-C  furosemide (LASIX) 40 MG tablet Take 1 tablet (40 mg total) by mouth daily. 05/13/17 06/12/17 Yes Henson, Vickie L, NP-C  gabapentin (NEURONTIN) 300 MG capsule Take 1 capsule (300 mg total) by mouth 2 (two) times daily. 04/03/17  Yes Henson, Vickie L, NP-C  insulin lispro (HUMALOG) 100 UNIT/ML injection Inject 0.02 mLs (2 Units total) into the skin 3 (three) times daily with meals. Patient taking differently: Inject 1-9 Units into the skin 3 (three)  times daily with meals. Sliding scale 04/09/17  Yes Romero Belling, MD  LANTUS 100 UNIT/ML injection Inject 0.18 mLs (18 Units total) into the skin every morning. 05/07/17  Yes Romero Belling, MD  lidocaine (LIDODERM) 5 % Place 1 patch onto the skin daily. Remove & Discard patch within 12 hours or as directed by MD 05/21/17  Yes Khatri, Hina, PA-C  lipase/protease/amylase (CREON) 36000 UNITS CPEP capsule Take 1 capsule (36,000 Units total) by mouth 3 (three) times daily with meals. 03/14/17  Yes Rolly Salter, MD  loperamide (IMODIUM) 2 MG capsule Take 1 capsule (2 mg total) by mouth 3 (three) times daily before meals. DO NOT TAKE IT FOR THE DAY YOU DONT HAVE A BOWEL MOVEMENT. 04/03/17  Yes Henson, Vickie L, NP-C  metoCLOPramide (REGLAN) 5 MG tablet Take 5 mg by mouth 4 (four) times daily -  before meals and at bedtime.   Yes [provider]  oxyCODONE-acetaminophen (PERCOCET/ROXICET) 5-325 MG tablet Take 1 tablet  by mouth every 4 (four) hours as needed for severe pain. 05/22/17  Yes Tegeler, Canary Brim, MD  promethazine (PHENERGAN) 12.5 MG tablet Take 1 tablet (12.5 mg total) by mouth every 6 (six) hours as needed for nausea or vomiting. 05/13/17  Yes Henson, Vickie L, NP-C  cholestyramine light (PREVALITE) 4 g packet Take 1 packet (4 g total) by mouth 3 (three) times daily. Patient not taking: Reported on 05/13/2017 03/14/17   Rolly Salter, MD  EPINEPHrine (EPIPEN 2-PAK) 0.3 mg/0.3 mL IJ SOAJ injection Inject 0.3 mLs (0.3 mg total) into the muscle as needed (allergic reaction). 03/21/17   Glade Lloyd, MD  glucose 4 GM chewable tablet Chew 1 tablet (4 g total) by mouth as needed for low blood sugar. 08/10/14   Orvilla Cornwall A, CNM  methocarbamol (ROBAXIN) 500 MG tablet Take 1 tablet (500 mg total) by mouth every 8 (eight) hours as needed for muscle spasms. 03/14/17   Rolly Salter, MD  oxyCODONE-acetaminophen (PERCOCET/ROXICET) 5-325 MG tablet Take 1 tablet by mouth every 8 (eight) hours  as needed for severe pain. Patient not taking: Reported on 05/21/2017 05/14/17   Barkley Boards, PA-C    Family History Family History  Problem Relation Age of Onset  . Diabetes Mother   . Hypertension Father     Social History Social History   Tobacco Use  . Smoking status: Never Smoker  . Smokeless tobacco: Never Used  Substance Use Topics  . Alcohol use: No    Alcohol/week: 0.0 oz    Frequency: Never  . Drug use: No     Allergies   Peanut-containing drug products and Lactose intolerance (gi)   Review of Systems Review of Systems   Physical Exam Updated Vital Signs BP (!) 145/109   Pulse (!) 105   Temp 97.6 F (36.4 C) (Oral)   Resp 18   LMP 11/12/2016   SpO2 100%   Physical Exam  Constitutional: She is oriented to person, place, and time. She appears well-developed and well-nourished. No distress.  HENT:  Head: Normocephalic and atraumatic.  Eyes: Conjunctivae and EOM are normal.  Neck: Normal range of motion.  Cardiovascular: Regular rhythm, normal heart sounds and intact distal pulses. Tachycardia present. Exam reveals no gallop and no friction rub.  No murmur heard. Pulmonary/Chest: Effort normal and breath sounds normal. No respiratory distress. She has no wheezes. She has no rales.  Abdominal: Soft. She exhibits no distension. There is no tenderness. There is no guarding.  Genitourinary:  Genitourinary Comments: Penrose drains in place, no surrounding cellulitis, no sign of purulent drainage, has severe tenderness surrounding area with possible fluctuance versus hardened scar tissue   Musculoskeletal: She exhibits no edema or tenderness.  Normal strength and sensation, reports pain to light touch in all distributions of leg, but reports she can feel and denies numbness    Neurological: She is alert and oriented to person, place, and time. She has normal strength. No sensory deficit.  Skin: Skin is warm and dry. No rash noted. She is not diaphoretic.  No erythema.  Nursing note and vitals reviewed.    ED Treatments / Results  Labs (all labs ordered are listed, but only abnormal results are displayed) Labs Reviewed  COMPREHENSIVE METABOLIC PANEL - Abnormal; Notable for the following components:      Result Value   Chloride 98 (*)    Glucose, Bld 287 (*)    All other components within normal limits  URINALYSIS, ROUTINE W REFLEX MICROSCOPIC -  Abnormal; Notable for the following components:   Glucose, UA 50 (*)    Hgb urine dipstick MODERATE (*)    Protein, ur 30 (*)    Bacteria, UA RARE (*)    Squamous Epithelial / LPF 0-5 (*)    All other components within normal limits  LIPASE, BLOOD  CBC  I-STAT TROPONIN, ED  I-STAT BETA HCG BLOOD, ED ([facility], [facility], [facility] ONLY)    EKG  EKG Interpretation  Date/Time:  Wednesday May 27 2017 15:47:38 EST Ventricular Rate:  116 PR Interval:    QRS Duration: 72 QT Interval:  325 QTC Calculation: 452 R Axis:   69 Text Interpretation:  Sinus tachycardia Probable left atrial enlargement ST elev, probable normal early repol pattern No significant change since last tracing Confirmed by Alvira Monday ([id number]) on 05/27/2017 4:42:41 PM       Radiology Dg Chest 2 View  Result Date: 05/27/2017 CLINICAL DATA:  Back pain radiating into the right leg. Central chest pain with nausea and vomiting beginning this morning. EXAM: CHEST  2 VIEW COMPARISON:  PA and lateral chest 05/20/2017 and 09/02/2016. FINDINGS: Lungs are clear. Heart size is normal. No pneumothorax or pleural fluid. No bony abnormality. IMPRESSION: Negative chest. Electronically Signed   By: Drusilla Kanner M.D.   On: 05/27/2017 16:41   Ct Angio Chest Pe W And/or Wo Contrast  Result Date: 05/27/2017 CLINICAL DATA:  Chest pain and abdominal pain. Fever. Crohn's disease. EXAM: CT ANGIOGRAPHY CHEST CT ABDOMEN AND PELVIS WITH CONTRAST TECHNIQUE: Multidetector CT imaging of the chest was performed using the standard protocol during bolus  administration of intravenous contrast. Multiplanar CT image reconstructions and MIPs were obtained to evaluate the vascular anatomy. Multidetector CT imaging of the abdomen and pelvis was performed using the standard protocol during bolus administration of intravenous contrast. CONTRAST:  ISOVUE-370 IOPAMIDOL (ISOVUE-370) INJECTION 76% COMPARISON:  CT abdomen and pelvis May 14, 2017 FINDINGS: CTA CHEST FINDINGS Cardiovascular: There is no demonstrable pulmonary embolus. There is no thoracic aortic aneurysm or dissection. The visualized great vessels appear unremarkable. There is no pericardial effusion or pericardial thickening. Mediastinum/Nodes: Visualized thyroid appears normal. There is no appreciable thoracic adenopathy. No esophageal lesions are evident. Lungs/Pleura: Lungs are clear. No pleural effusion or pleural thickening evident. Musculoskeletal: No blastic or lytic bone lesions are evident. There is an apparent bone island in the rightward aspect of the T6 vertebral body. Review of the MIP images confirms the above findings. CT ABDOMEN and PELVIS FINDINGS Hepatobiliary: No focal liver lesions are evident. Gallbladder is absent. There is dilatation of the common hepatic duct to 11 mm with tapering of the common bile duct. No biliary duct mass or calculus. There is borderline intrahepatic biliary duct dilatation, a likely a consequence of post cholecystectomy state. Pancreas: No pancreatic mass or inflammatory focus is evident. Pancreatic duct is upper normal in size. Spleen: No splenic lesions are evident. Adrenals/Urinary Tract: Adrenals bilaterally appear normal. Kidneys bilaterally show no evident mass or hydronephrosis on either side. There is no renal or ureteral calculus on either side. Urinary bladder is midline with wall thickness within normal limits. Stomach/Bowel: There is stool in fluid throughout the colon. There is no appreciable bowel wall or mesenteric thickening. No bowel  obstruction is evident. No free air or portal venous air is evident. There is no appreciable within the abdomen or pelvis. 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 This appearance is unchanged compared to the prior  examination. Vascular/Lymphatic: No abdominal aortic aneurysm. No vascular lesions are evident. There is no adenopathy in the abdomen or pelvis. Reproductive: Uterus is in the midline.  No pelvic mass. Other: Appendix appears normal. No ascites or abscess is seen within the peritoneum or retroperitoneum. Musculoskeletal: Surgical drains are again noted in the subcutaneous tissues posterior to the sacrum on the right and medial gluteal muscles on the right. No well-defined fluid collection is seen in these areas of inflammation. There remains soft tissue thickening in the gluteal cleft region. No blastic or lytic bone lesions identified. No osteomyelitis. No intramuscular lesion evident. Review of the MIP images confirms the above findings. IMPRESSION: CT angiogram chest: 1. No demonstrable pulmonary embolus. No thoracic aortic aneurysm or dissection. 2.  Lungs clear. 3.  No evident adenopathy. CT abdomen and pelvis: 1. 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 2. Surgical drain is noted posterior to the sacrum and gluteal muscles on the right with inflammatory change but no well-defined abscess or fluid. Thickening at the gluteal crease remains. 3. No bowel wall thickening or abscess. No bowel obstruction. Appendix appears unremarkable. 4. Gallbladder absent. Mild dilatation of the common hepatic duct with tapering of common bile. Borderline intrahepatic biliary duct dilatation. No biliary duct mass or calculus. Electronically Signed   By: Bretta Bang III M.D.   On: 05/27/2017 19:28   Ct Abdomen Pelvis W Contrast  Result Date: 05/27/2017 CLINICAL DATA:  Chest pain and abdominal pain. Fever. Crohn's  disease. EXAM: CT ANGIOGRAPHY CHEST CT ABDOMEN AND PELVIS WITH CONTRAST TECHNIQUE: Multidetector CT imaging of the chest was performed using the standard protocol during bolus administration of intravenous contrast. Multiplanar CT image reconstructions and MIPs were obtained to evaluate the vascular anatomy. Multidetector CT imaging of the abdomen and pelvis was performed using the standard protocol during bolus administration of intravenous contrast. CONTRAST:  ISOVUE-370 IOPAMIDOL (ISOVUE-370) INJECTION 76% COMPARISON:  CT abdomen and pelvis May 14, 2017 FINDINGS: CTA CHEST FINDINGS Cardiovascular: There is no demonstrable pulmonary embolus. There is no thoracic aortic aneurysm or dissection. The visualized great vessels appear unremarkable. There is no pericardial effusion or pericardial thickening. Mediastinum/Nodes: Visualized thyroid appears normal. There is no appreciable thoracic adenopathy. No esophageal lesions are evident. Lungs/Pleura: Lungs are clear. No pleural effusion or pleural thickening evident. Musculoskeletal: No blastic or lytic bone lesions are evident. There is an apparent bone island in the rightward aspect of the T6 vertebral body. Review of the MIP images confirms the above findings. CT ABDOMEN and PELVIS FINDINGS Hepatobiliary: No focal liver lesions are evident. Gallbladder is absent. There is dilatation of the common hepatic duct to 11 mm with tapering of the common bile duct. No biliary duct mass or calculus. There is borderline intrahepatic biliary duct dilatation, a likely a consequence of post cholecystectomy state. Pancreas: No pancreatic mass or inflammatory focus is evident. Pancreatic duct is upper normal in size. Spleen: No splenic lesions are evident. Adrenals/Urinary Tract: Adrenals bilaterally appear normal. Kidneys bilaterally show no evident mass or hydronephrosis on either side. There is no renal or ureteral calculus on either side. Urinary bladder is midline  with wall thickness within normal limits. Stomach/Bowel: There is stool in fluid throughout the colon. There is no appreciable bowel wall or mesenteric thickening. No bowel obstruction is evident. No free air or portal venous air is evident. There is no appreciable within the abdomen or pelvis. 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 This appearance is unchanged compared to the prior examination. Vascular/Lymphatic: No abdominal aortic aneurysm. No vascular lesions are evident. There is no adenopathy in the abdomen or pelvis. Reproductive: Uterus is in the midline.  No pelvic mass. Other: Appendix appears normal. No ascites or abscess is seen within the peritoneum or retroperitoneum. Musculoskeletal: Surgical drains are again noted in the subcutaneous tissues posterior to the sacrum on the right and medial gluteal muscles on the right. No well-defined fluid collection is seen in these areas of inflammation. There remains soft tissue thickening in the gluteal cleft region. No blastic or lytic bone lesions identified. No osteomyelitis. No intramuscular lesion evident. Review of the MIP images confirms the above findings. IMPRESSION: CT angiogram chest: 1. No demonstrable pulmonary embolus. No thoracic aortic aneurysm or dissection. 2.  Lungs clear. 3.  No evident adenopathy. CT abdomen and pelvis: 1. 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 2. Surgical drain is noted posterior to the sacrum and gluteal muscles on the right with inflammatory change but no well-defined abscess or fluid. Thickening at the gluteal crease remains. 3. No bowel wall thickening or abscess. No bowel obstruction. Appendix appears unremarkable. 4. Gallbladder absent. Mild dilatation of the common hepatic duct with tapering of common bile. Borderline intrahepatic biliary duct dilatation. No biliary duct mass or calculus. Electronically Signed   By:  Bretta Bang III M.D.   On: 05/27/2017 19:28    Procedures Procedures (including critical care time)  Medications Ordered in ED Medications  ondansetron (ZOFRAN-ODT) disintegrating tablet 4 mg (4 mg Oral Given 05/27/17 1611)  morphine 4 MG/ML injection 4 mg (4 mg Intravenous Given 05/27/17 1920)  iopamidol (ISOVUE-370) 76 % injection (100 mLs Intravenous Contrast Given 05/27/17 1853)  sodium chloride 0.9 % bolus 1,000 mL (0 mLs Intravenous Stopped 05/27/17 2123)     Initial Impression / Assessment and Plan / ED Course  I have reviewed the triage vital signs and the nursing notes.  Pertinent labs & imaging results that were available during my care of the patient were reviewed by me and considered in my medical decision making (see chart for details).     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  Patient with tachycardia, recent surgery, and is high risk for PE.  Given her concern for infection, feel d-dimer will likely be elevated.  Given her worsening pain in the gluteus, concerned that it feels like it did when she had recollection of her perirectal abscess, will order CT PE study, as well as CT abdomen pelvis to evaluate for pulmonary embolus and recurrent abscess.  Patient is afebrile, without midline back pain, have low suspicion for epidural abscess or other etiologies of her right gluteal and leg pain.  CT completed shows no sign of pulmonary embolus, and shows stable findings regarding the drain, with no recurrent abscess.  Troponin negative after constant pain for day, low suspicion for ACS. Suspect msk etiology of chest pain.  Her right leg radicular pain may be secondary to sciatic nerve irritation localized in the piriformis region.  She  has normal strength and sensation on exam. No asymmetric swelling or calf pain to suggest DVT. She has no red flags of back pain.  Patient has a chronic pain syndrome related to her recurrent perirectal  surgeries.  Recommend follow-up with her pain physician, as well as her surgery doctors. Mild tachycardia and htn present on previous evaluations. Patient discharged in stable condition with understanding of reasons to return.   Final Clinical Impressions(s) / ED Diagnoses   Final diagnoses:  Radicular pain  Gluteal pain  Chest pain, unspecified type  Other chronic pain    ED Discharge Orders    None      Alvira Monday, MD 05/28/17 [phone]

## 2017-05-27 NOTE — ED Triage Notes (Signed)
Patient c/o back pain that radiates down right leg that has been on going and seen here multiple times. Patient adds that has had central chest pain that started this morning with n/v. Denies diarrhea or urinary problems.

## 2017-05-27 NOTE — ED Notes (Signed)
Patient transported to CT 

## 2017-05-31 ENCOUNTER — Encounter (HOSPITAL_COMMUNITY): Payer: Self-pay | Admitting: Emergency Medicine

## 2017-05-31 ENCOUNTER — Other Ambulatory Visit: Payer: Self-pay

## 2017-05-31 ENCOUNTER — Emergency Department (HOSPITAL_COMMUNITY)
Admission: EM | Admit: 2017-05-31 | Discharge: 2017-05-31 | Disposition: A | Discharge status: Home / Self Care | Payer: 59 | Attending: Emergency Medicine | Admitting: Emergency Medicine

## 2017-05-31 DIAGNOSIS — R1013 Epigastric pain: Secondary | ICD-10-CM

## 2017-05-31 DIAGNOSIS — Z79899 Other long term (current) drug therapy: Secondary | ICD-10-CM | POA: Insufficient documentation

## 2017-05-31 DIAGNOSIS — R110 Nausea: Secondary | ICD-10-CM

## 2017-05-31 DIAGNOSIS — Z91010 Allergy to peanuts: Secondary | ICD-10-CM | POA: Diagnosis not present

## 2017-05-31 DIAGNOSIS — M545 Low back pain, unspecified: Secondary | ICD-10-CM

## 2017-05-31 DIAGNOSIS — I10 Essential (primary) hypertension: Secondary | ICD-10-CM | POA: Diagnosis not present

## 2017-05-31 DIAGNOSIS — G8929 Other chronic pain: Secondary | ICD-10-CM

## 2017-05-31 DIAGNOSIS — L0231 Cutaneous abscess of buttock: Secondary | ICD-10-CM | POA: Insufficient documentation

## 2017-05-31 DIAGNOSIS — E1010 Type 1 diabetes mellitus with ketoacidosis without coma: Secondary | ICD-10-CM | POA: Diagnosis not present

## 2017-05-31 DIAGNOSIS — Z794 Long term (current) use of insulin: Secondary | ICD-10-CM | POA: Diagnosis not present

## 2017-05-31 LAB — CBC
HCT: 33.5 % — ABNORMAL LOW (ref 36.0–46.0)
Hemoglobin: 11.2 g/dL — ABNORMAL LOW (ref 12.0–15.0)
MCH: 27.8 pg (ref 26.0–34.0)
MCHC: 33.4 g/dL (ref 30.0–36.0)
MCV: 83.1 fL (ref 78.0–100.0)
Platelets: 212 10*3/uL (ref 150–400)
RBC: 4.03 MIL/uL (ref 3.87–5.11)
RDW: 12.1 % (ref 11.5–15.5)
WBC: 4.7 10*3/uL (ref 4.0–10.5)

## 2017-05-31 LAB — COMPREHENSIVE METABOLIC PANEL
ALT: 16 U/L (ref 14–54)
AST: 16 U/L (ref 15–41)
Albumin: 3.5 g/dL (ref 3.5–5.0)
Alkaline Phosphatase: 87 U/L (ref 38–126)
Anion gap: 7 (ref 5–15)
BUN: 10 mg/dL (ref 6–20)
CO2: 28 mmol/L (ref 22–32)
Calcium: 9.2 mg/dL (ref 8.9–10.3)
Chloride: 101 mmol/L (ref 101–111)
Creatinine, Ser: 0.53 mg/dL (ref 0.44–1.00)
GFR calc Af Amer: >60 mL/min (ref >60)
GFR calc non Af Amer: >60 mL/min (ref >60)
Glucose, Bld: 282 mg/dL — ABNORMAL HIGH (ref 65–99)
Potassium: 3.7 mmol/L (ref 3.5–5.1)
Sodium: 136 mmol/L (ref 135–145)
Total Bilirubin: 0.4 mg/dL (ref 0.3–1.2)
Total Protein: 7.4 g/dL (ref 6.5–8.1)

## 2017-05-31 LAB — URINALYSIS, ROUTINE W REFLEX MICROSCOPIC
Bilirubin Urine: NEGATIVE
Glucose, UA: >=500 mg/dL — AB
Ketones, ur: NEGATIVE mg/dL
Leukocytes, UA: NEGATIVE
Nitrite: NEGATIVE
Protein, ur: NEGATIVE mg/dL
Specific Gravity, Urine: 1.012 (ref 1.005–1.030)
Squamous Epithelial / LPF: NONE SEEN
pH: 5 (ref 5.0–8.0)

## 2017-05-31 LAB — I-STAT BETA HCG BLOOD, ED (MC, WL, AP ONLY): I-stat hCG, quantitative: <5 m[IU]/mL (ref <5)

## 2017-05-31 LAB — LIPASE, BLOOD: Lipase: 25 U/L (ref 11–51)

## 2017-05-31 MED ORDER — ALUM & MAG HYDROXIDE-SIMETH 400-400-40 MG/5ML PO SUSP: 10.0000 mL | Freq: Four times a day (QID) | ORAL | 0 refills | Status: DC | PRN

## 2017-05-31 MED ORDER — SODIUM CHLORIDE 0.9 % IV BOLUS (SEPSIS)
1000.0000 mL | Freq: Once | INTRAVENOUS | Status: AC
  Administered 2017-05-31: 1000 mL via INTRAVENOUS

## 2017-05-31 MED ORDER — CYCLOBENZAPRINE HCL 10 MG PO TABS
10.0000 mg | ORAL_TABLET | Freq: Once | ORAL | Status: AC
  Administered 2017-05-31: 10 mg via ORAL
  Filled 2017-05-31: qty 1

## 2017-05-31 MED ORDER — METOCLOPRAMIDE HCL 5 MG/ML IJ SOLN
10.0000 mg | Freq: Once | INTRAMUSCULAR | Status: AC
  Administered 2017-05-31: 10 mg via INTRAVENOUS
  Filled 2017-05-31: qty 2

## 2017-05-31 MED ORDER — GI COCKTAIL ~~LOC~~
30.0000 mL | Freq: Once | ORAL | Status: AC
  Administered 2017-05-31: 30 mL via ORAL
  Filled 2017-05-31: qty 30

## 2017-05-31 MED ORDER — OXYCODONE-ACETAMINOPHEN 5-325 MG PO TABS
1.0000 | ORAL_TABLET | Freq: Once | ORAL | Status: AC
  Administered 2017-05-31: 1 via ORAL
  Filled 2017-05-31: qty 1

## 2017-05-31 MED ORDER — LIDOCAINE VISCOUS 2 % MT SOLN: 15.0000 mL | Freq: Four times a day (QID) | OROMUCOSAL | 0 refills | Status: DC | PRN

## 2017-05-31 NOTE — ED Notes (Signed)
Upon entering room, pt sitting in bed crying, states she is having a panic attack and wants to go home. Writer will speak with EDP regarding discharge. Abd pain has improved

## 2017-05-31 NOTE — ED Provider Notes (Signed)
[facility]-EMERGENCY DEPT Provider Note  CSN: [id number] Arrival date & time: 05/31/17 1191  Chief Complaint(s) Abdominal Pain  HPI [name] is a 27 y.o. female   The history is provided by the patient.  Abdominal Pain   This is a recurrent (similar to prior gastroparesis pain in the past) problem. The current episode started 12 to 24 hours ago. The problem occurs constantly. The problem has not changed (fluctuating) since onset.The pain is associated with an unknown factor. The pain is located in the epigastric region. The quality of the pain is aching and burning. The pain is moderate. Associated symptoms include nausea. Pertinent negatives include fever, diarrhea, hematochezia, melena, vomiting and constipation. Nothing aggravates the symptoms. Nothing relieves the symptoms. Her past medical history is significant for Crohn's disease.   Has tried taking Percocet, Reglan, and Tumms w/o relief.  Past Medical History Past Medical History:  Diagnosis Date  . Anxiety   . Crohn's disease (HCC)   . Diabetes type 1, uncontrolled (HCC) 11/14/2011   Since age 36  . Fibromyalgia   . Gastroparesis   . Hypertension   . Infection    UTI April 2016   Patient Active Problem List   Diagnosis Date Noted  . Intractable nausea and vomiting 04/11/2017  . Nausea and vomiting 04/10/2017  . Essential hypertension   . Colitis   . Crohn's disease (HCC) 04/03/2017  . Abscess and cellulitis of gluteal region 03/24/2017  . Cellulitis and abscess of buttock 03/23/2017  . Malnutrition of moderate degree 03/19/2017  . Anasarca 03/16/2017  . 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 Diarrhea 02/27/2017  . Lower GI bleed 02/27/2017  . DKA (diabetic ketoacidosis) (HCC) 02/26/2017  . Acute metabolic encephalopathy   . DKA, type 1 (HCC) 06/11/2013  . Orthostatic hypotension dysautonomic syndrome (HCC) 04/28/2013  . Body aches 04/28/2013  .  Orthostatic hypotension 04/28/2013  . Diabetes mellitus type 1 (HCC) 04/28/2013  . Protein-calorie malnutrition, severe (HCC) 04/28/2013  . Noncompliance with diabetes treatment 09/01/2012  . Dyspnea 02/28/2012  . Sinus tachycardia 02/28/2012  . Hyperglycemia without ketosis 02/28/2012  . Chest pain 12/04/2011  . Diabetes type 1, uncontrolled (HCC) 11/14/2011  . Hypertension associated with diabetes (HCC) 11/14/2011  . Gastroparesis 11/14/2011  . Hyponatremia 09/19/2011  . Abdominal pain 09/18/2011  . Metabolic alkalosis 09/18/2011  . Diabetic hyperosmolar non-ketotic state (HCC) 09/18/2011  . DIAB W/UNSPEC COMP TYPE I [JUV TYPE] UNCNTRL 06/04/2010  . CONSTIPATION 01/01/2010  . RECTAL PAIN 01/01/2010   Home Medication(s) Prior to Admission medications   Medication Sig Start Date End Date Taking? Authorizing Provider  acetaminophen (TYLENOL) 500 MG tablet Take 2,000 mg by mouth daily as needed for moderate pain.    [provider]  alum & mag hydroxide-simeth (MAALOX ADVANCED MAX ST) 400-400-40 MG/5ML suspension Take 10 mLs by mouth every 6 (six) hours as needed for indigestion. 05/31/17   Deke Tilghman, Amadeo Garnet, MD  cholestyramine light (PREVALITE) 4 g packet Take 1 packet (4 g total) by mouth 3 (three) times daily. Patient not taking: Reported on 05/13/2017 03/14/17   Rolly Salter, MD  citalopram (CELEXA) 20 MG tablet Take 1 tablet (20 mg total) by mouth daily. 05/13/17   Henson, Vickie L, NP-C  cyclobenzaprine (FLEXERIL) 10 MG tablet Take 1 tablet (10 mg total) by mouth 2 (two) times daily as needed for muscle spasms. 05/21/17   Khatri, Hina, PA-C  diphenoxylate-atropine (LOMOTIL) 2.5-0.025 MG tablet Take 2 tablets  by mouth 4 (four) times daily. 05/18/17   Henson, Vickie L, NP-C  EPINEPHrine (EPIPEN 2-PAK) 0.3 mg/0.3 mL IJ SOAJ injection Inject 0.3 mLs (0.3 mg total) into the muscle as needed (allergic reaction). 03/21/17   Glade Lloyd, MD  ferrous sulfate 325 (65 FE) MG  tablet Take 1 tablet (325 mg total) by mouth 2 (two) times daily with a meal. 05/13/17   Henson, Vickie L, NP-C  folic acid (FOLVITE) 1 MG tablet Take 1 tablet (1 mg total) by mouth daily. 05/13/17   Henson, Vickie L, NP-C  furosemide (LASIX) 40 MG tablet Take 1 tablet (40 mg total) by mouth daily. 05/13/17 06/12/17  Henson, Vickie L, NP-C  gabapentin (NEURONTIN) 300 MG capsule Take 1 capsule (300 mg total) by mouth 2 (two) times daily. 04/03/17   Henson, Vickie L, NP-C  glucose 4 GM chewable tablet Chew 1 tablet (4 g total) by mouth as needed for low blood sugar. 08/10/14   Orvilla Cornwall A, CNM  insulin lispro (HUMALOG) 100 UNIT/ML injection Inject 0.02 mLs (2 Units total) into the skin 3 (three) times daily with meals. Patient taking differently: Inject 1-9 Units into the skin 3 (three) times daily with meals. Sliding scale 04/09/17   Romero Belling, MD  LANTUS 100 UNIT/ML injection Inject 0.18 mLs (18 Units total) into the skin every morning. 05/07/17   Romero Belling, MD  lidocaine (LIDODERM) 5 % Place 1 patch onto the skin daily. Remove & Discard patch within 12 hours or as directed by MD 05/21/17   Dietrich Pates, PA-C  lidocaine (XYLOCAINE) 2 % solution Use as directed 15 mLs in the mouth or throat every 6 (six) hours as needed (stomach pain). 05/31/17   Nira Conn, MD  lipase/protease/amylase (CREON) 36000 UNITS CPEP capsule Take 1 capsule (36,000 Units total) by mouth 3 (three) times daily with meals. 03/14/17   Rolly Salter, MD  loperamide (IMODIUM) 2 MG capsule Take 1 capsule (2 mg total) by mouth 3 (three) times daily before meals. DO NOT TAKE IT FOR THE DAY YOU DONT HAVE A BOWEL MOVEMENT. 04/03/17   Henson, Vickie L, NP-C  methocarbamol (ROBAXIN) 500 MG tablet Take 1 tablet (500 mg total) by mouth every 8 (eight) hours as needed for muscle spasms. 03/14/17   Rolly Salter, MD  metoCLOPramide (REGLAN) 5 MG tablet Take 5 mg by mouth 4 (four) times daily -  before meals and at bedtime.     [provider]  oxyCODONE-acetaminophen (PERCOCET/ROXICET) 5-325 MG tablet Take 1 tablet by mouth every 8 (eight) hours as needed for severe pain. Patient not taking: Reported on 05/21/2017 05/14/17   McDonald, Elah Avellino Earls A, PA-C  oxyCODONE-acetaminophen (PERCOCET/ROXICET) 5-325 MG tablet Take 1 tablet by mouth every 4 (four) hours as needed for severe pain. 05/22/17   Tegeler, Canary Brim, MD  promethazine (PHENERGAN) 12.5 MG tablet Take 1 tablet (12.5 mg total) by mouth every 6 (six) hours as needed for nausea or vomiting. 05/13/17   Avanell Shackleton, NP-C  Past Surgical History Past Surgical History:  Procedure Laterality Date  . ANKLE SURGERY    . CHOLECYSTECTOMY  11/15/2011   Procedure: LAPAROSCOPIC CHOLECYSTECTOMY WITH INTRAOPERATIVE CHOLANGIOGRAM;  Surgeon: [name], MD;  Location: [facility];  Service: General;  Laterality: N/A;  . ESOPHAGOGASTRODUODENOSCOPY  12/03/2011   Procedure: ESOPHAGOGASTRODUODENOSCOPY (EGD);  Surgeon: Theda Belfast, MD;  Location: [facility];  Service: Endoscopy;  Laterality: N/A;  . FLEXIBLE SIGMOIDOSCOPY N/A 03/10/2017   Procedure: FLEXIBLE SIGMOIDOSCOPY;  Surgeon: Jeani Hawking, MD;  Location: [facility];  Service: Endoscopy;  Laterality: N/A;  . INCISION AND DRAINAGE PERIRECTAL ABSCESS N/A 03/01/2017   Procedure: IRRIGATION AND DEBRIDEMENT PERIRECTAL ABSCESS;  Surgeon: Ovidio Kin, MD;  Location: [facility];  Service: General;  Laterality: N/A;  . IRRIGATION AND DEBRIDEMENT BUTTOCKS N/A 03/23/2017   Procedure: IRRIGATION AND DEBRIDEMENT BUTTOCKS, SETON PLACEMENT;  Surgeon: Romie Levee, MD;  Location: [facility];  Service: General;  Laterality: N/A;  . LAPAROSCOPY  11/23/2011   Procedure: LAPAROSCOPY DIAGNOSTIC;  Surgeon: Mariella Saa, MD;  Location: [facility];  Service: General;  Laterality: N/A;   Family History Family  History  Problem Relation Age of Onset  . Diabetes Mother   . Hypertension Father     Social History Social History   Tobacco Use  . Smoking status: Never Smoker  . Smokeless tobacco: Never Used  Substance Use Topics  . Alcohol use: No    Alcohol/week: 0.0 oz    Frequency: Never  . Drug use: No   Allergies Peanut-containing drug products and Lactose intolerance (gi)  Review of Systems Review of Systems  Constitutional: Negative for fever.  Gastrointestinal: Positive for abdominal pain and nausea. Negative for constipation, diarrhea, hematochezia, melena and vomiting.   All other systems are reviewed and are negative for acute change except as noted in the HPI  Physical Exam Vital Signs  I have reviewed the triage vital signs BP (!) 116/97 (BP Location: Left Arm)   Pulse (!) 108   Temp 98.4 F (36.9 C) (Oral)   Resp 17   Ht 5\' 7"  (1.702 m)   Wt 61.2 kg (135 lb)   SpO2 98%   BMI 21.14 kg/m   Physical Exam  Constitutional: She is oriented to person, place, and time. She appears well-developed and well-nourished. No distress.  HENT:  Head: Normocephalic and atraumatic.  Nose: Nose normal.  Eyes: Conjunctivae and EOM are normal. Pupils are equal, round, and reactive to light. Right eye exhibits no discharge. Left eye exhibits no discharge. No scleral icterus.  Neck: Normal range of motion. Neck supple.  Cardiovascular: Normal rate and regular rhythm. Exam reveals no gallop and no friction rub.  No murmur heard. Pulmonary/Chest: Effort normal and breath sounds normal. No stridor. No respiratory distress. She has no rales.  Abdominal: Soft. She exhibits no distension. There is tenderness in the right upper quadrant, epigastric area, periumbilical area and left upper quadrant. There is guarding. There is no rigidity, no rebound, no CVA tenderness and negative Murphy's sign.  Musculoskeletal: She exhibits no edema or tenderness.       Back:  Neurological: She is alert  and oriented to person, place, and time.  Skin: Skin is warm and dry. No rash noted. She is not diaphoretic. No erythema.  Psychiatric: She has a normal mood and affect.  Vitals reviewed.   ED Results and Treatments Labs (all labs ordered are listed, but only abnormal results are displayed) Labs Reviewed  COMPREHENSIVE METABOLIC PANEL - Abnormal; Notable  for the following components:      Result Value   Glucose, Bld 282 (*)    All other components within normal limits  CBC - Abnormal; Notable for the following components:   Hemoglobin 11.2 (*)    HCT 33.5 (*)    All other components within normal limits  URINALYSIS, ROUTINE W REFLEX MICROSCOPIC - Abnormal; Notable for the following components:   Glucose, UA >=500 (*)    Hgb urine dipstick SMALL (*)    Bacteria, UA RARE (*)    All other components within normal limits  LIPASE, BLOOD  I-STAT BETA HCG BLOOD, ED ([facility], [facility], [facility] ONLY)                                                                                                                         EKG  EKG Interpretation  Date/Time:    Ventricular Rate:    PR Interval:    QRS Duration:   QT Interval:    QTC Calculation:   R Axis:     Text Interpretation:        Radiology No results found. Pertinent labs & imaging results that were available during my care of the patient were reviewed by me and considered in my medical decision making (see chart for details).  Medications Ordered in ED Medications  metoCLOPramide (REGLAN) injection 10 mg (10 mg Intravenous Given 05/31/17 0809)  sodium chloride 0.9 % bolus 1,000 mL (1,000 mLs Intravenous New Bag/Given 05/31/17 0810)  gi cocktail (Maalox,Lidocaine,Donnatal) (30 mLs Oral Given 05/31/17 0809)  oxyCODONE-acetaminophen (PERCOCET/ROXICET) 5-325 MG per tablet 1 tablet (1 tablet Oral Given 05/31/17 0835)  cyclobenzaprine (FLEXERIL) tablet 10 mg (10 mg Oral Given 05/31/17 0835)                                                                                                                                     Procedures Procedures  (including critical care time)  Medical Decision Making / ED Course I have reviewed the nursing notes for this encounter and the patient's prior records (if available in EHR or on provided paperwork).    Upper abdominal pain and tenderness.  Patient has had a cholecystectomy in the past.  Denies any alcohol consumption or drug use.  Differential includes exacerbation of her gastroparesis, gastritis, esophagitis, pancreatitis, biliary obstruction.  Also consider possible Crohn's flare though patient denies any complications or Crohn's flares  in the past and she is not currently treated.  Low suspicion for small bowel obstruction.  Patient provided with IV Reglan and fluids.  Also given GI cocktail.  Labs grossly reassuring without leukocytosis, biliary obstruction, evidence of pancreatitis, significant electrolyte derangements or renal insufficiency.  Patient has significant improvement in her nausea and epigastric pain following a GI cocktail and the Reglan.  She was complaining of chronic back pain and she was given her home Percocet and Flexeril. Patient was able to tolerate oral intake.  Low suspicion for serious intra-abdominal inflammatory/infectious process requiring advanced imaging at this time.  The patient appears reasonably screened and/or stabilized for discharge and I doubt any other medical condition or other [facility] requiring further screening, evaluation, or treatment in the ED at this time prior to discharge.  The patient is safe for discharge with strict return precautions.     Final Clinical Impression(s) / ED Diagnoses Final diagnoses:  Epigastric pain  Nausea  Chronic bilateral low back pain without sciatica  Abscess of buttock, right   Disposition: Discharge  Condition: Good  I have discussed the results, Dx and Tx plan with the patient who expressed understanding and  agree(s) with the plan. Discharge instructions discussed at great length. The patient was given strict return precautions who verbalized understanding of the instructions. No further questions at time of discharge.    ED Discharge Orders        Ordered    alum & mag hydroxide-simeth (MAALOX ADVANCED MAX ST) 400-400-40 MG/5ML suspension  Every 6 hours PRN     05/31/17 0904    lidocaine (XYLOCAINE) 2 % solution  Every 6 hours PRN     05/31/17 0904       Follow Up: Primary care provider  Schedule an appointment as soon as possible for a visit  in 3-5 days, If symptoms do not improve or  worsen       This chart was dictated using voice recognition software.  Despite best efforts to proofread,  errors can occur which can change the documentation meaning.   Nira Conn, MD 05/31/17 [phone]

## 2017-05-31 NOTE — ED Triage Notes (Signed)
Pt reports abdominal and back pain that has been ongoing for the last 24 hours. Pt denies any vomiting but reporting nausea.

## 2017-06-04 ENCOUNTER — Other Ambulatory Visit: Payer: Self-pay | Admitting: Family Medicine

## 2017-06-16 ENCOUNTER — Other Ambulatory Visit: Payer: Self-pay

## 2017-06-16 ENCOUNTER — Emergency Department (HOSPITAL_COMMUNITY)
Admission: EM | Admit: 2017-06-16 | Discharge: 2017-06-17 | Disposition: A | Discharge status: Home / Self Care | Payer: 59 | Attending: Emergency Medicine | Admitting: Emergency Medicine

## 2017-06-16 ENCOUNTER — Encounter (HOSPITAL_COMMUNITY): Payer: Self-pay | Admitting: Emergency Medicine

## 2017-06-16 DIAGNOSIS — E109 Type 1 diabetes mellitus without complications: Secondary | ICD-10-CM | POA: Diagnosis not present

## 2017-06-16 DIAGNOSIS — R1013 Epigastric pain: Secondary | ICD-10-CM | POA: Diagnosis not present

## 2017-06-16 DIAGNOSIS — R110 Nausea: Secondary | ICD-10-CM | POA: Diagnosis not present

## 2017-06-16 DIAGNOSIS — K30 Functional dyspepsia: Secondary | ICD-10-CM | POA: Diagnosis not present

## 2017-06-16 LAB — URINALYSIS, ROUTINE W REFLEX MICROSCOPIC
Bilirubin Urine: NEGATIVE
Glucose, UA: 50 mg/dL — AB
Ketones, ur: NEGATIVE mg/dL
Nitrite: NEGATIVE
Protein, ur: NEGATIVE mg/dL
Specific Gravity, Urine: 1.013 (ref 1.005–1.030)
pH: 5 (ref 5.0–8.0)

## 2017-06-16 LAB — CBC
HCT: 31.8 % — ABNORMAL LOW (ref 36.0–46.0)
Hemoglobin: 10.9 g/dL — ABNORMAL LOW (ref 12.0–15.0)
MCH: 28.5 pg (ref 26.0–34.0)
MCHC: 34.3 g/dL (ref 30.0–36.0)
MCV: 83 fL (ref 78.0–100.0)
Platelets: 240 10*3/uL (ref 150–400)
RBC: 3.83 MIL/uL — ABNORMAL LOW (ref 3.87–5.11)
RDW: 12.8 % (ref 11.5–15.5)
WBC: 4.8 10*3/uL (ref 4.0–10.5)

## 2017-06-16 LAB — COMPREHENSIVE METABOLIC PANEL
ALT: 18 U/L (ref 14–54)
AST: 20 U/L (ref 15–41)
Albumin: 3.7 g/dL (ref 3.5–5.0)
Alkaline Phosphatase: 84 U/L (ref 38–126)
Anion gap: 8 (ref 5–15)
BUN: 12 mg/dL (ref 6–20)
CO2: 31 mmol/L (ref 22–32)
Calcium: 9.5 mg/dL (ref 8.9–10.3)
Chloride: 99 mmol/L — ABNORMAL LOW (ref 101–111)
Creatinine, Ser: 0.55 mg/dL (ref 0.44–1.00)
GFR calc Af Amer: >60 mL/min (ref >60)
GFR calc non Af Amer: >60 mL/min (ref >60)
Glucose, Bld: 203 mg/dL — ABNORMAL HIGH (ref 65–99)
Potassium: 3.5 mmol/L (ref 3.5–5.1)
Sodium: 138 mmol/L (ref 135–145)
Total Bilirubin: 0.5 mg/dL (ref 0.3–1.2)
Total Protein: 7.9 g/dL (ref 6.5–8.1)

## 2017-06-16 LAB — I-STAT BETA HCG BLOOD, ED (MC, WL, AP ONLY): I-stat hCG, quantitative: <5 m[IU]/mL (ref <5)

## 2017-06-16 LAB — LIPASE, BLOOD: Lipase: 21 U/L (ref 11–51)

## 2017-06-16 MED ORDER — SODIUM CHLORIDE 0.9 % IV BOLUS (SEPSIS)
1000.0000 mL | Freq: Once | INTRAVENOUS | Status: AC
  Administered 2017-06-17: 1000 mL via INTRAVENOUS

## 2017-06-16 MED ORDER — DIPHENHYDRAMINE HCL 50 MG/ML IJ SOLN
12.5000 mg | Freq: Once | INTRAMUSCULAR | Status: AC
  Administered 2017-06-17: 12.5 mg via INTRAVENOUS
  Filled 2017-06-16: qty 1

## 2017-06-16 MED ORDER — SODIUM CHLORIDE 0.9 % IV BOLUS (SEPSIS)
1000.0000 mL | Freq: Once | INTRAVENOUS | Status: AC
  Administered 2017-06-16: 1000 mL via INTRAVENOUS

## 2017-06-16 MED ORDER — GI COCKTAIL ~~LOC~~
30.0000 mL | Freq: Once | ORAL | Status: AC
  Administered 2017-06-16: 30 mL via ORAL
  Filled 2017-06-16: qty 30

## 2017-06-16 MED ORDER — METOCLOPRAMIDE HCL 5 MG/ML IJ SOLN
10.0000 mg | Freq: Once | INTRAMUSCULAR | Status: AC
  Administered 2017-06-17: 10 mg via INTRAVENOUS
  Filled 2017-06-16: qty 2

## 2017-06-16 NOTE — ED Provider Notes (Signed)
[facility]-EMERGENCY DEPT Provider Note   CSN: [id number] Arrival date & time: 06/16/17  1858     History   Chief Complaint Chief Complaint  Patient presents with  . Abdominal Pain    HPI [name] is a 27 y.o. female.  Patient presents with epigastric abdominal pain, radiating to back and chest, that has been ongoing for the past 2 weeks. She denies fever. She reports nausea without vomiting, no diarrhea. She reports similar pain in the past but not on medications for reflux. She has tried TUMs at home without relief. She has a Rx for Reglan for h/o gastroparesis and reports that this does not offer relief either. No hematemesis.   The history is provided by the patient. No language interpreter was used.  Abdominal Pain   Associated symptoms include nausea. Pertinent negatives include fever and vomiting.    Past Medical History:  Diagnosis Date  . Anxiety   . Crohn's disease (HCC)   . Diabetes type 1, uncontrolled (HCC) 11/14/2011   Since age 88  . Fibromyalgia   . Gastroparesis   . Hypertension   . Infection    UTI April 2016    Patient Active Problem List   Diagnosis Date Noted  . Intractable nausea and vomiting 04/11/2017  . Nausea and vomiting 04/10/2017  . Essential hypertension   . Colitis   . Crohn's disease (HCC) 04/03/2017  . Abscess and cellulitis of gluteal region 03/24/2017  . Cellulitis and abscess of buttock 03/23/2017  . Malnutrition of moderate degree 03/19/2017  . Anasarca 03/16/2017  . 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 Diarrhea 02/27/2017  . Lower GI bleed 02/27/2017  . DKA (diabetic ketoacidosis) (HCC) 02/26/2017  . Acute metabolic encephalopathy   . DKA, type 1 (HCC) 06/11/2013  . Orthostatic hypotension dysautonomic syndrome (HCC) 04/28/2013  . Body aches 04/28/2013  . Orthostatic hypotension 04/28/2013  . Diabetes mellitus type 1 (HCC) 04/28/2013  . Protein-calorie  malnutrition, severe (HCC) 04/28/2013  . Noncompliance with diabetes treatment 09/01/2012  . Dyspnea 02/28/2012  . Sinus tachycardia 02/28/2012  . Hyperglycemia without ketosis 02/28/2012  . Chest pain 12/04/2011  . Diabetes type 1, uncontrolled (HCC) 11/14/2011  . Hypertension associated with diabetes (HCC) 11/14/2011  . Gastroparesis 11/14/2011  . Hyponatremia 09/19/2011  . Abdominal pain 09/18/2011  . Metabolic alkalosis 09/18/2011  . Diabetic hyperosmolar non-ketotic state (HCC) 09/18/2011  . DIAB W/UNSPEC COMP TYPE I [JUV TYPE] UNCNTRL 06/04/2010  . CONSTIPATION 01/01/2010  . RECTAL PAIN 01/01/2010    Past Surgical History:  Procedure Laterality Date  . ANKLE SURGERY    . CHOLECYSTECTOMY  11/15/2011   Procedure: LAPAROSCOPIC CHOLECYSTECTOMY WITH INTRAOPERATIVE CHOLANGIOGRAM;  Surgeon: [name], MD;  Location: [facility];  Service: General;  Laterality: N/A;  . ESOPHAGOGASTRODUODENOSCOPY  12/03/2011   Procedure: ESOPHAGOGASTRODUODENOSCOPY (EGD);  Surgeon: Theda Belfast, MD;  Location: [facility];  Service: Endoscopy;  Laterality: N/A;  . FLEXIBLE SIGMOIDOSCOPY N/A 03/10/2017   Procedure: FLEXIBLE SIGMOIDOSCOPY;  Surgeon: Jeani Hawking, MD;  Location: [facility];  Service: Endoscopy;  Laterality: N/A;  . INCISION AND DRAINAGE PERIRECTAL ABSCESS N/A 03/01/2017   Procedure: IRRIGATION AND DEBRIDEMENT PERIRECTAL ABSCESS;  Surgeon: Ovidio Kin, MD;  Location: [facility];  Service: General;  Laterality: N/A;  . IRRIGATION AND DEBRIDEMENT BUTTOCKS N/A 03/23/2017   Procedure: IRRIGATION AND DEBRIDEMENT BUTTOCKS, SETON PLACEMENT;  Surgeon: Romie Levee, MD;  Location: [facility];  Service: General;  Laterality: N/A;  . LAPAROSCOPY  11/23/2011   Procedure: LAPAROSCOPY DIAGNOSTIC;  Surgeon: Mariella Saa, MD;  Location: [facility];  Service: General;  Laterality: N/A;    OB History    Gravida Para Term Preterm AB Living   1 1   1   1    SAB TAB Ectopic Multiple Live Births         0 1        Home Medications    Prior to Admission medications   Medication Sig Start Date End Date Taking? Authorizing Provider  acetaminophen (TYLENOL) 500 MG tablet Take 1,000 mg by mouth daily as needed for moderate pain.    Yes [provider]  citalopram (CELEXA) 20 MG tablet Take 1 tablet (20 mg total) by mouth daily. 05/13/17  Yes Henson, Vickie L, NP-C  cyclobenzaprine (FLEXERIL) 10 MG tablet Take 1 tablet (10 mg total) by mouth 2 (two) times daily as needed for muscle spasms. 05/21/17  Yes Khatri, Hina, PA-C  ferrous sulfate 325 (65 FE) MG tablet Take 1 tablet (325 mg total) by mouth 2 (two) times daily with a meal. 05/13/17  Yes Henson, Vickie L, NP-C  folic acid (FOLVITE) 1 MG tablet Take 1 tablet (1 mg total) by mouth daily. 05/13/17  Yes Henson, Vickie L, NP-C  furosemide (LASIX) 40 MG tablet Take 40 mg by mouth daily.   Yes [provider]  gabapentin (NEURONTIN) 300 MG capsule Take 1 capsule (300 mg total) by mouth 2 (two) times daily. 04/03/17  Yes Henson, Vickie L, NP-C  insulin lispro (HUMALOG) 100 UNIT/ML injection Inject 0.02 mLs (2 Units total) into the skin 3 (three) times daily with meals. Patient taking differently: Inject 1-9 Units into the skin 3 (three) times daily with meals. Sliding scale 04/09/17  Yes Romero Belling, MD  LANTUS 100 UNIT/ML injection Inject 0.18 mLs (18 Units total) into the skin every morning. 05/07/17  Yes Romero Belling, MD  lidocaine (LIDODERM) 5 % Place 1 patch onto the skin daily. Remove & Discard patch within 12 hours or as directed by MD 05/21/17  Yes Khatri, Hina, PA-C  lipase/protease/amylase (CREON) 36000 UNITS CPEP capsule Take 1 capsule (36,000 Units total) by mouth 3 (three) times daily with meals. 03/14/17  Yes Rolly Salter, MD  loperamide (IMODIUM) 2 MG capsule Take 1 capsule (2 mg total) by mouth 3 (three) times daily before meals. DO NOT TAKE IT FOR THE DAY YOU DONT HAVE A BOWEL MOVEMENT. 04/03/17  Yes Henson, Vickie L, NP-C    methocarbamol (ROBAXIN) 500 MG tablet Take 1 tablet (500 mg total) by mouth every 8 (eight) hours as needed for muscle spasms. 03/14/17  Yes Rolly Salter, MD  metoCLOPramide (REGLAN) 5 MG tablet Take 5 mg by mouth every 6 (six) hours as needed for nausea or vomiting.    Yes [provider]  alum & mag hydroxide-simeth (MAALOX ADVANCED MAX ST) 400-400-40 MG/5ML suspension Take 10 mLs by mouth every 6 (six) hours as needed for indigestion. Patient not taking: Reported on 06/16/2017 05/31/17   Nira Conn, MD  cholestyramine light (PREVALITE) 4 g packet Take 1 packet (4 g total) by mouth 3 (three) times daily. Patient not taking: Reported on 05/13/2017 03/14/17   Rolly Salter, MD  diphenoxylate-atropine (LOMOTIL) 2.5-0.025 MG tablet Take 2 tablets by mouth 4 (four) times daily. Patient not taking: Reported on 06/16/2017 05/18/17   Hetty Blend L, NP-C  EPINEPHrine (EPIPEN 2-PAK) 0.3 mg/0.3 mL IJ SOAJ injection Inject 0.3 mLs (0.3 mg  total) into the muscle as needed (allergic reaction). 03/21/17   Glade Lloyd, MD  furosemide (LASIX) 40 MG tablet Take 1 tablet (40 mg total) by mouth daily. 05/13/17 06/12/17  Henson, Vickie L, NP-C  glucose 4 GM chewable tablet Chew 1 tablet (4 g total) by mouth as needed for low blood sugar. 08/10/14   Orvilla Cornwall A, CNM  lidocaine (XYLOCAINE) 2 % solution Use as directed 15 mLs in the mouth or throat every 6 (six) hours as needed (stomach pain). 05/31/17   CardamaAmadeo Garnet, MD  oxyCODONE-acetaminophen (PERCOCET/ROXICET) 5-325 MG tablet Take 1 tablet by mouth every 8 (eight) hours as needed for severe pain. Patient not taking: Reported on 05/21/2017 05/14/17   McDonald, Pedro Earls A, PA-C  oxyCODONE-acetaminophen (PERCOCET/ROXICET) 5-325 MG tablet Take 1 tablet by mouth every 4 (four) hours as needed for severe pain. Patient not taking: Reported on 06/16/2017 05/22/17   Tegeler, Canary Brim, MD  promethazine (PHENERGAN) 12.5 MG tablet Take 1 tablet  (12.5 mg total) by mouth every 6 (six) hours as needed for nausea or vomiting. 05/13/17   Avanell Shackleton, NP-C    Family History Family History  Problem Relation Age of Onset  . Diabetes Mother   . Hypertension Father     Social History Social History   Tobacco Use  . Smoking status: Never Smoker  . Smokeless tobacco: Never Used  Substance Use Topics  . Alcohol use: No    Alcohol/week: 0.0 oz    Frequency: Never  . Drug use: No     Allergies   Peanut-containing drug products and Lactose intolerance (gi)   Review of Systems Review of Systems  Constitutional: Negative for chills and fever.  Respiratory: Negative for cough and shortness of breath.   Gastrointestinal: Positive for abdominal pain and nausea. Negative for vomiting.  Genitourinary: Negative.   Musculoskeletal: Negative.   Neurological: Negative for syncope.     Physical Exam Updated Vital Signs BP 115/85   Pulse (!) 106   Temp 98.3 F (36.8 C) (Oral)   Resp 20   Ht 5\' 7"  (1.702 m)   Wt 61.7 kg (136 lb)   SpO2 100%   BMI 21.30 kg/m   Physical Exam  Constitutional: She appears well-developed and well-nourished.  HENT:  Head: Normocephalic.  Neck: Normal range of motion. Neck supple.  Cardiovascular: Normal rate and regular rhythm.  Pulmonary/Chest: Effort normal and breath sounds normal.  Abdominal: Soft. Bowel sounds are normal. There is tenderness in the epigastric area. There is no rebound and no guarding.  Musculoskeletal: Normal range of motion.  Neurological: She is alert. No cranial nerve deficit.  Skin: Skin is warm and dry. No rash noted.  Psychiatric: She has a normal mood and affect.     ED Treatments / Results  Labs (all labs ordered are listed, but only abnormal results are displayed) Labs Reviewed  COMPREHENSIVE METABOLIC PANEL - Abnormal; Notable for the following components:      Result Value   Chloride 99 (*)    Glucose, Bld 203 (*)    All other components within  normal limits  CBC - Abnormal; Notable for the following components:   RBC 3.83 (*)    Hemoglobin 10.9 (*)    HCT 31.8 (*)    All other components within normal limits  LIPASE, BLOOD  URINALYSIS, ROUTINE W REFLEX MICROSCOPIC  I-STAT BETA HCG BLOOD, ED ([facility], [facility], [facility] ONLY)   Results for orders placed or performed during the hospital encounter  of 06/16/17  Lipase, blood  Result Value Ref Range   Lipase 21 11 - 51 U/L  Comprehensive metabolic panel  Result Value Ref Range   Sodium 138 135 - 145 mmol/L   Potassium 3.5 3.5 - 5.1 mmol/L   Chloride 99 (L) 101 - 111 mmol/L   CO2 31 22 - 32 mmol/L   Glucose, Bld 203 (H) 65 - 99 mg/dL   BUN 12 6 - 20 mg/dL   Creatinine, Ser 1.61 0.44 - 1.00 mg/dL   Calcium 9.5 8.9 - 09.6 mg/dL   Total Protein 7.9 6.5 - 8.1 g/dL   Albumin 3.7 3.5 - 5.0 g/dL   AST 20 15 - 41 U/L   ALT 18 14 - 54 U/L   Alkaline Phosphatase 84 38 - 126 U/L   Total Bilirubin 0.5 0.3 - 1.2 mg/dL   GFR calc non Af Amer >60 >60 mL/min   GFR calc Af Amer >60 >60 mL/min   Anion gap 8 5 - 15  CBC  Result Value Ref Range   WBC 4.8 4.0 - 10.5 K/uL   RBC 3.83 (L) 3.87 - 5.11 MIL/uL   Hemoglobin 10.9 (L) 12.0 - 15.0 g/dL   HCT 04.5 (L) 40.9 - 81.1 %   MCV 83.0 78.0 - 100.0 fL   MCH 28.5 26.0 - 34.0 pg   MCHC 34.3 30.0 - 36.0 g/dL   RDW 91.4 78.2 - 95.6 %   Platelets 240 150 - 400 K/uL  Urinalysis, Routine w reflex microscopic  Result Value Ref Range   Color, Urine YELLOW YELLOW   APPearance HAZY (A) CLEAR   Specific Gravity, Urine 1.013 1.005 - 1.030   pH 5.0 5.0 - 8.0   Glucose, UA 50 (A) NEGATIVE mg/dL   Hgb urine dipstick SMALL (A) NEGATIVE   Bilirubin Urine NEGATIVE NEGATIVE   Ketones, ur NEGATIVE NEGATIVE mg/dL   Protein, ur NEGATIVE NEGATIVE mg/dL   Nitrite NEGATIVE NEGATIVE   Leukocytes, UA LARGE (A) NEGATIVE   RBC / HPF 6-30 0 - 5 RBC/hpf   WBC, UA 6-30 0 - 5 WBC/hpf   Bacteria, UA MANY (A) NONE SEEN   Squamous Epithelial / LPF 6-30 (A) NONE SEEN   Mucus  PRESENT    Hyaline Casts, UA PRESENT   I-Stat beta hCG blood, ED  Result Value Ref Range   I-stat hCG, quantitative <5.0 <5 mIU/mL   Comment 3            EKG  EKG Interpretation None       Radiology No results found.  Procedures Procedures (including critical care time)  Medications Ordered in ED Medications  gi cocktail (Maalox,Lidocaine,Donnatal) (not administered)  sodium chloride 0.9 % bolus 1,000 mL (not administered)     Initial Impression / Assessment and Plan / ED Course  I have reviewed the triage vital signs and the nursing notes.  Pertinent labs & imaging results that were available during my care of the patient were reviewed by me and considered in my medical decision making (see chart for details).     Patient presents with epigastric abdominal pain, nausea and vomiting. Symptoms x 2 weeks. No fever. She is a T1DM and reports her blood sugars have been controlled.   Labs are reassuring. UA is contaminated but she denies symptoms of dysuria, frequency. IVF's provided with GI cocktail, REglan and Benadryl. She feels much better. Pain is resolved. No further vomiting.   Final Clinical Impressions(s) / ED Diagnoses   Final  diagnoses:  None   1. Epigastric abdominal pain 2. Dyspepsia   ED Discharge Orders    None       Elpidio Anis, PA-C 06/17/17 [id number]    Doug Sou, MD 06/18/17 [phone]

## 2017-06-16 NOTE — ED Triage Notes (Signed)
Pt is c/o abd pain and epigastric pain that has been going on for a week and a half and is progressively getting worse  Pt states it is a tightness and burning

## 2017-06-17 ENCOUNTER — Ambulatory Visit: Payer: Self-pay | Admitting: Endocrinology

## 2017-06-17 DIAGNOSIS — Z0289 Encounter for other administrative examinations: Secondary | ICD-10-CM

## 2017-06-17 MED ORDER — SUCRALFATE 1 GM/10ML PO SUSP: 1.0000 g | Freq: Three times a day (TID) | ORAL | 0 refills | Status: DC

## 2017-06-17 NOTE — Discharge Instructions (Signed)
Take Carafate as prescribed. Follow up with your doctor for recheck if symptoms persist. Continue your other medications as usual.

## 2017-06-19 ENCOUNTER — Emergency Department (HOSPITAL_COMMUNITY)
Admission: EM | Admit: 2017-06-19 | Discharge: 2017-06-19 | Disposition: A | Discharge status: Home / Self Care | Payer: 59 | Attending: Emergency Medicine | Admitting: Emergency Medicine

## 2017-06-19 ENCOUNTER — Encounter (HOSPITAL_COMMUNITY): Payer: Self-pay

## 2017-06-19 ENCOUNTER — Other Ambulatory Visit: Payer: Self-pay

## 2017-06-19 ENCOUNTER — Emergency Department (HOSPITAL_COMMUNITY): Payer: 59

## 2017-06-19 DIAGNOSIS — Z79899 Other long term (current) drug therapy: Secondary | ICD-10-CM | POA: Diagnosis not present

## 2017-06-19 DIAGNOSIS — I10 Essential (primary) hypertension: Secondary | ICD-10-CM | POA: Insufficient documentation

## 2017-06-19 DIAGNOSIS — R1084 Generalized abdominal pain: Secondary | ICD-10-CM | POA: Diagnosis present

## 2017-06-19 DIAGNOSIS — Z91010 Allergy to peanuts: Secondary | ICD-10-CM | POA: Insufficient documentation

## 2017-06-19 DIAGNOSIS — Z794 Long term (current) use of insulin: Secondary | ICD-10-CM | POA: Diagnosis not present

## 2017-06-19 DIAGNOSIS — R197 Diarrhea, unspecified: Secondary | ICD-10-CM | POA: Diagnosis not present

## 2017-06-19 DIAGNOSIS — R112 Nausea with vomiting, unspecified: Secondary | ICD-10-CM | POA: Diagnosis not present

## 2017-06-19 DIAGNOSIS — K5090 Crohn's disease, unspecified, without complications: Secondary | ICD-10-CM | POA: Diagnosis not present

## 2017-06-19 DIAGNOSIS — E109 Type 1 diabetes mellitus without complications: Secondary | ICD-10-CM | POA: Diagnosis not present

## 2017-06-19 DIAGNOSIS — R109 Unspecified abdominal pain: Secondary | ICD-10-CM | POA: Diagnosis not present

## 2017-06-19 LAB — COMPREHENSIVE METABOLIC PANEL
ALT: 16 U/L (ref 14–54)
AST: 17 U/L (ref 15–41)
Albumin: 3.5 g/dL (ref 3.5–5.0)
Alkaline Phosphatase: 83 U/L (ref 38–126)
Anion gap: 10 (ref 5–15)
BUN: 5 mg/dL — ABNORMAL LOW (ref 6–20)
CO2: 29 mmol/L (ref 22–32)
Calcium: 9.2 mg/dL (ref 8.9–10.3)
Chloride: 100 mmol/L — ABNORMAL LOW (ref 101–111)
Creatinine, Ser: 0.48 mg/dL (ref 0.44–1.00)
GFR calc Af Amer: >60 mL/min (ref >60)
GFR calc non Af Amer: >60 mL/min (ref >60)
Glucose, Bld: 201 mg/dL — ABNORMAL HIGH (ref 65–99)
Potassium: 3.4 mmol/L — ABNORMAL LOW (ref 3.5–5.1)
Sodium: 139 mmol/L (ref 135–145)
Total Bilirubin: 0.7 mg/dL (ref 0.3–1.2)
Total Protein: 7.4 g/dL (ref 6.5–8.1)

## 2017-06-19 LAB — URINALYSIS, ROUTINE W REFLEX MICROSCOPIC
Bilirubin Urine: NEGATIVE
Glucose, UA: >=500 mg/dL — AB
Ketones, ur: NEGATIVE mg/dL
Nitrite: NEGATIVE
Protein, ur: 30 mg/dL — AB
Specific Gravity, Urine: 1.009 (ref 1.005–1.030)
pH: 6 (ref 5.0–8.0)

## 2017-06-19 LAB — CBC
HCT: 32.1 % — ABNORMAL LOW (ref 36.0–46.0)
Hemoglobin: 11 g/dL — ABNORMAL LOW (ref 12.0–15.0)
MCH: 28.4 pg (ref 26.0–34.0)
MCHC: 34.3 g/dL (ref 30.0–36.0)
MCV: 82.9 fL (ref 78.0–100.0)
Platelets: 216 10*3/uL (ref 150–400)
RBC: 3.87 MIL/uL (ref 3.87–5.11)
RDW: 12.9 % (ref 11.5–15.5)
WBC: 4.4 10*3/uL (ref 4.0–10.5)

## 2017-06-19 LAB — CBG MONITORING, ED: Glucose-Capillary: 196 mg/dL — ABNORMAL HIGH (ref 65–99)

## 2017-06-19 LAB — I-STAT BETA HCG BLOOD, ED (MC, WL, AP ONLY): I-stat hCG, quantitative: <5 m[IU]/mL (ref <5)

## 2017-06-19 LAB — LIPASE, BLOOD: Lipase: 20 U/L (ref 11–51)

## 2017-06-19 IMAGING — CT CT ABD-PELV W/ CM
2 of 4 series · 16 of 46 positions shown, 18 images · IV contrast (ISOVUE)
Comparison: [DATE] and [DATE] as well as [DATE]

CLINICAL DATA: Left mid abdominal pain and diarrhea 1 week. Crohn's
disease.

EXAM:
CT ABDOMEN AND PELVIS WITH CONTRAST
TECHNIQUE: Multidetector CT imaging of the abdomen and pelvis was performed
using the standard protocol following bolus administration of
intravenous contrast.
CONTRAST:  100mL [WQ] IOPAMIDOL ([WQ]) INJECTION 61%

[Series 2: axial st · axial · 0.72mm/px · z∈[+1185,+1600]mm · 13 of 93 slices shown, 15 images]
[im 5/93  soft-tissue]
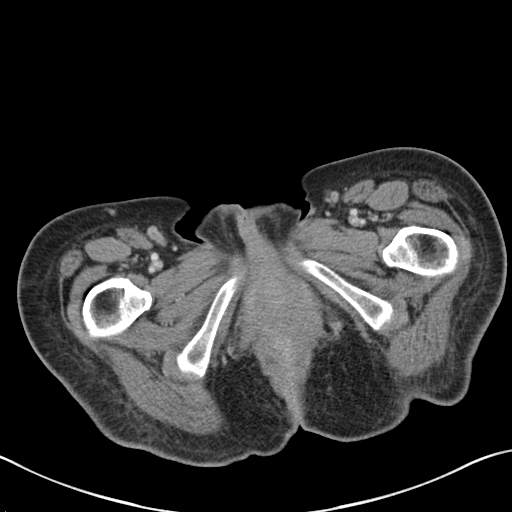
[im 5/93  bone]
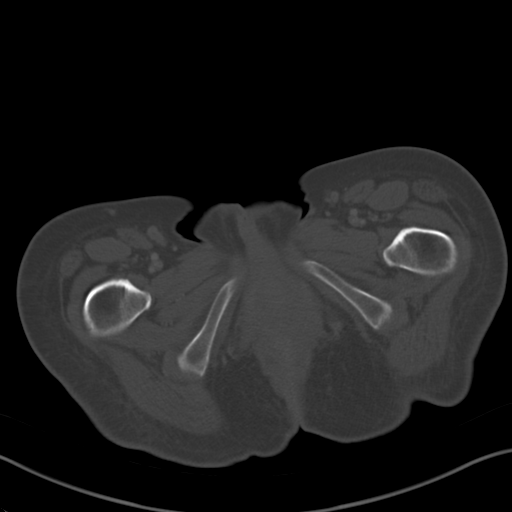
[im 14/93  soft-tissue]
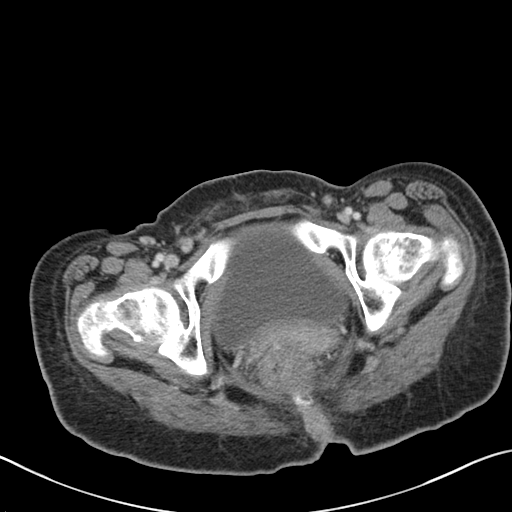
[im 19/93  soft-tissue]
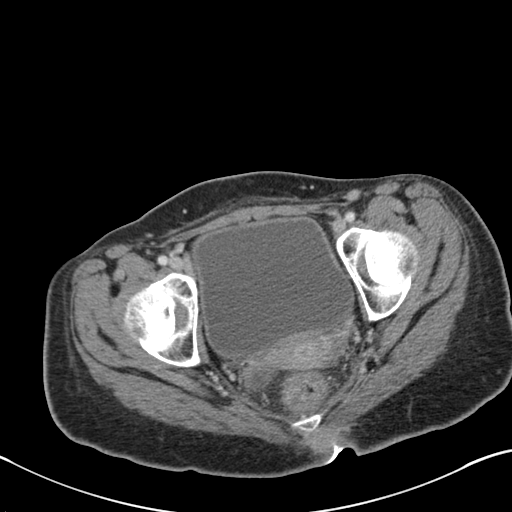
[im 28/93  soft-tissue]
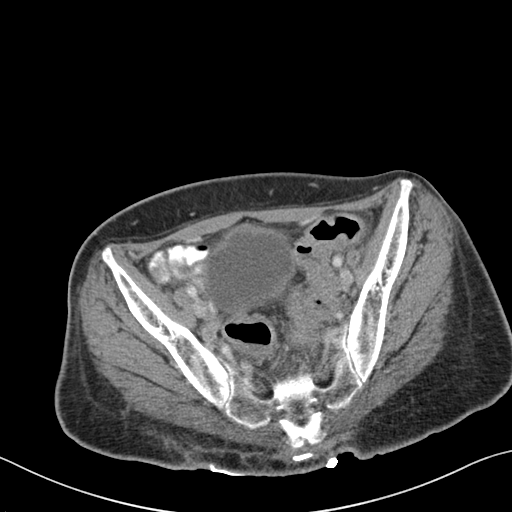
[im 33/93  soft-tissue]
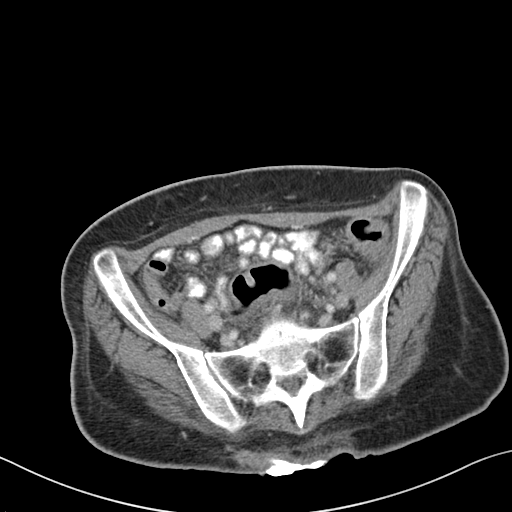
[im 42/93  soft-tissue]
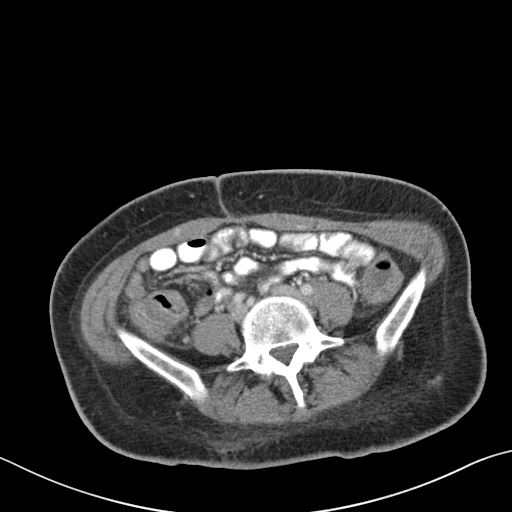
[im 47/93  soft-tissue]
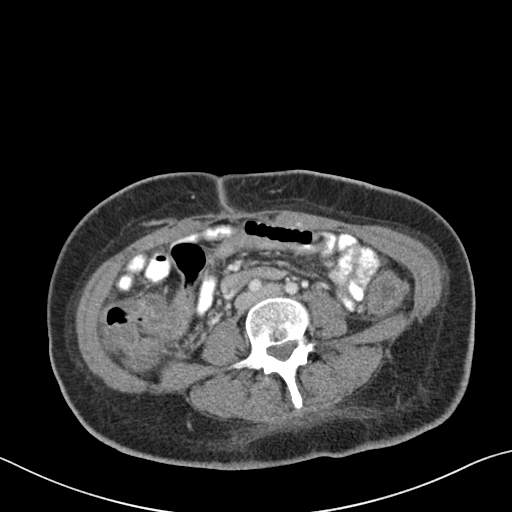
[im 51/93  soft-tissue]
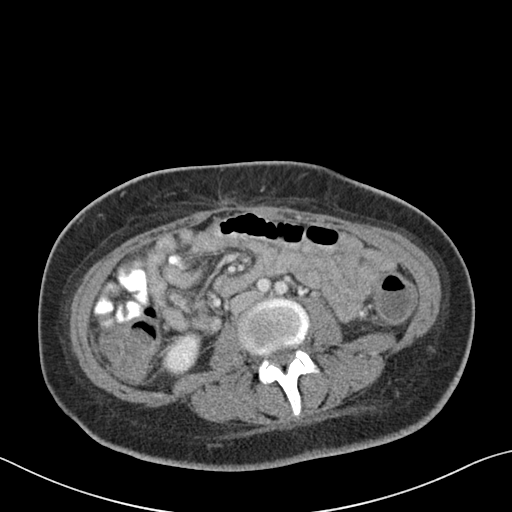
[im 60/93  soft-tissue]
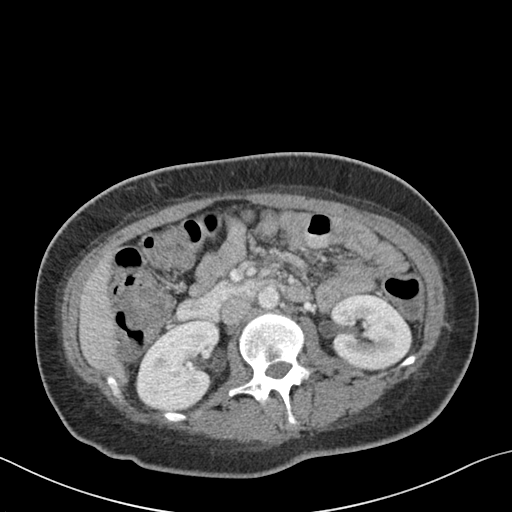
[im 60/93  bone]
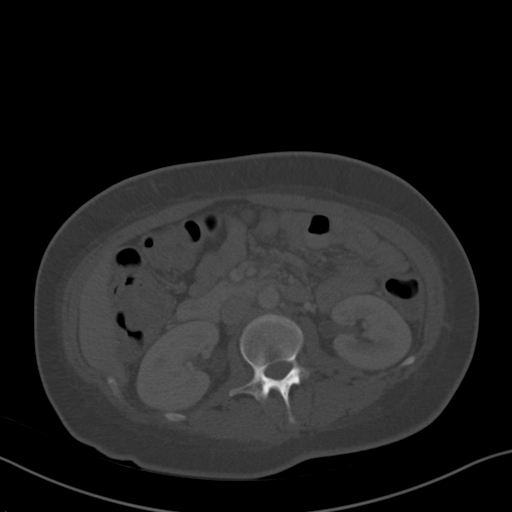
[im 65/93  soft-tissue]
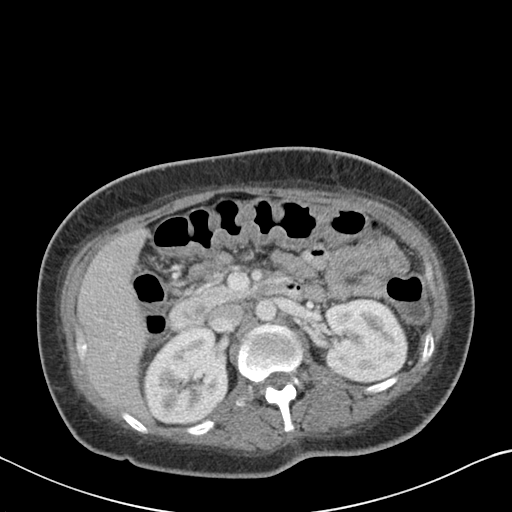
[im 74/93  soft-tissue]
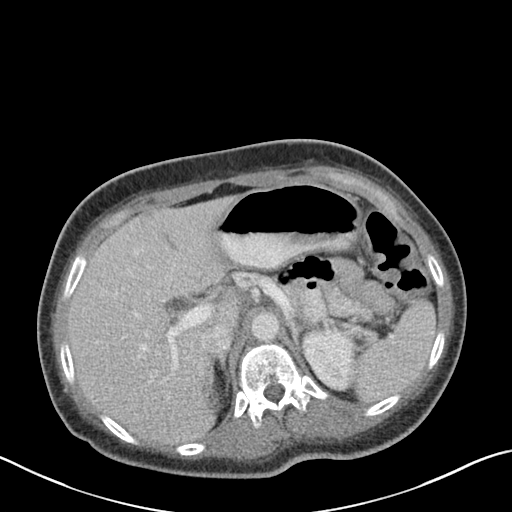
[im 79/93  soft-tissue]
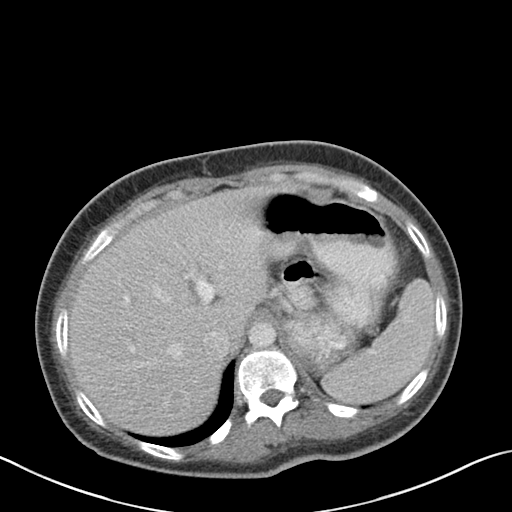
[im 88/93  soft-tissue]
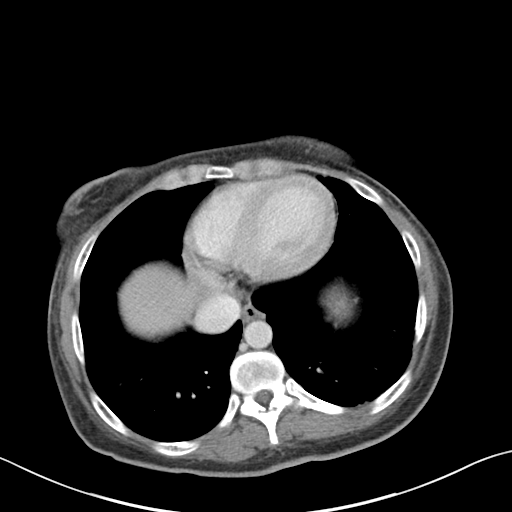

[Series 4: coronal st · coronal · 0.92mm/px · 3 of 73 slices shown]
[im 25/73  soft-tissue]
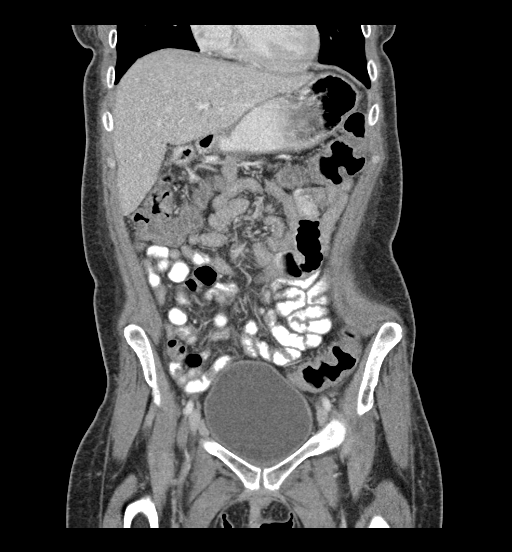
[im 33/73  soft-tissue]
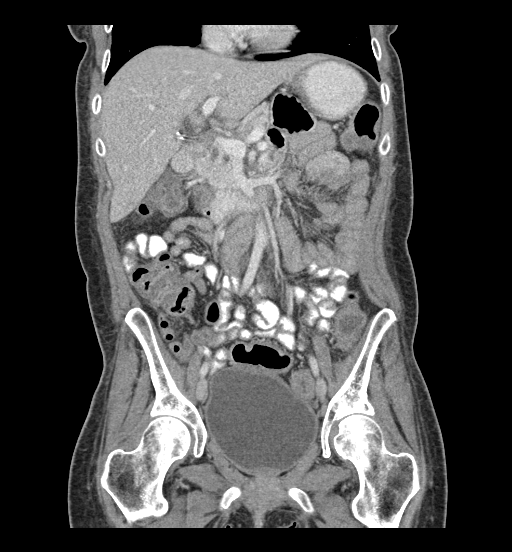
[im 41/73  soft-tissue]
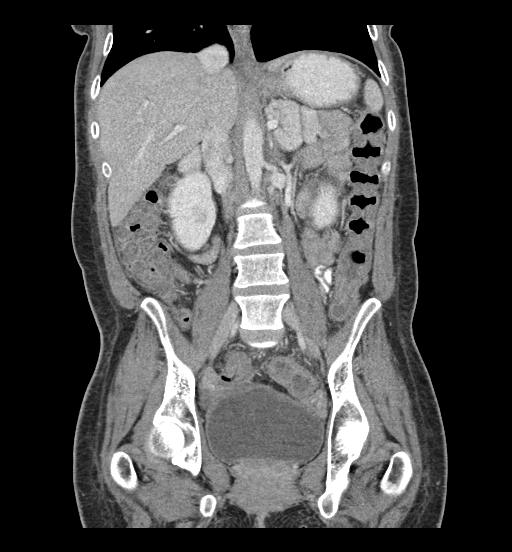

[16 of 46 positions shown; findings below may reference images not displayed]

FINDINGS: Lower chest: Lung bases demonstrate minimal opacification of the
left lower lobe likely atelectasis. No effusion.

Hepatobiliary: Previous cholecystectomy. Liver and biliary tree are
normal.

Pancreas: Normal.

Spleen: Normal.

Adrenals/Urinary Tract: Adrenal glands are normal. Kidneys are
normal in size without hydronephrosis or nephrolithiasis. Ureters
and bladder are unremarkable.

Stomach/Bowel: Stomach is normal. Small bowel is within normal.
Appendix is normal. There is minimal wall thickening throughout the
colon likely due to mild colitis associated due to patient's Crohn's
disease. No free fluid or free peritoneal air.

Vascular/Lymphatic: Within normal.

Reproductive: Within normal.

Other: Again noted is a cutaneous drain over the right gluteal
region without significant change and evidence of suspected annual
cutaneous fistula unchanged.

Musculoskeletal: Within normal.
IMPRESSION: Mild wall thickening throughout the colon likely mild acute colitis
due to patient's Crohn's disease.

Changes over the subcutaneous region of the right gluteal region
with surgical drain without significant change. Associated tract
extending from skin to the anal rectal region suggesting possible
anocutaneous fistula unchanged.

Minimal left base opacification likely atelectasis. Early infection
is possible.

## 2017-06-19 MED ORDER — IOPAMIDOL (ISOVUE-300) INJECTION 61%
INTRAVENOUS | Status: AC
  Filled 2017-06-19: qty 30

## 2017-06-19 MED ORDER — IOPAMIDOL (ISOVUE-300) INJECTION 61%
INTRAVENOUS | Status: AC
  Filled 2017-06-19: qty 100

## 2017-06-19 MED ORDER — OXYCODONE-ACETAMINOPHEN 5-325 MG PO TABS
1.0000 | ORAL_TABLET | Freq: Once | ORAL | Status: AC
  Administered 2017-06-19: 1 via ORAL
  Filled 2017-06-19: qty 1

## 2017-06-19 MED ORDER — SODIUM CHLORIDE 0.9 % IJ SOLN
INTRAMUSCULAR | Status: AC
  Filled 2017-06-19: qty 50

## 2017-06-19 MED ORDER — CIPROFLOXACIN HCL 500 MG PO TABS: 500.0000 mg | ORAL_TABLET | Freq: Two times a day (BID) | ORAL | 0 refills | Status: DC

## 2017-06-19 MED ORDER — MORPHINE SULFATE (PF) 4 MG/ML IV SOLN
4.0000 mg | Freq: Once | INTRAVENOUS | Status: DC
  Filled 2017-06-19: qty 1

## 2017-06-19 MED ORDER — ONDANSETRON 8 MG PO TBDP
8.0000 mg | ORAL_TABLET | Freq: Once | ORAL | Status: AC
  Administered 2017-06-19: 8 mg via ORAL
  Filled 2017-06-19: qty 1

## 2017-06-19 MED ORDER — IOPAMIDOL (ISOVUE-300) INJECTION 61%
100.0000 mL | Freq: Once | INTRAVENOUS | Status: AC | PRN
  Administered 2017-06-19: 100 mL via INTRAVENOUS

## 2017-06-19 MED ORDER — ONDANSETRON HCL 4 MG/2ML IJ SOLN
4.0000 mg | Freq: Once | INTRAMUSCULAR | Status: DC
  Filled 2017-06-19: qty 2

## 2017-06-19 MED ORDER — IOPAMIDOL (ISOVUE-300) INJECTION 61%
30.0000 mL | Freq: Once | INTRAVENOUS | Status: AC
  Administered 2017-06-19: 30 mL via ORAL

## 2017-06-19 MED ORDER — SODIUM CHLORIDE 0.9 % IV BOLUS (SEPSIS)
1000.0000 mL | Freq: Once | INTRAVENOUS | Status: AC
  Administered 2017-06-19: 1000 mL via INTRAVENOUS

## 2017-06-19 MED ORDER — MORPHINE SULFATE (PF) 4 MG/ML IV SOLN
4.0000 mg | Freq: Once | INTRAVENOUS | Status: AC
  Administered 2017-06-19: 4 mg via INTRAVENOUS
  Filled 2017-06-19: qty 1

## 2017-06-19 MED ORDER — METRONIDAZOLE 500 MG PO TABS: 500.0000 mg | ORAL_TABLET | Freq: Two times a day (BID) | ORAL | 0 refills | Status: DC

## 2017-06-19 NOTE — ED Notes (Signed)
IV team at bedside 

## 2017-06-19 NOTE — ED Provider Notes (Signed)
[facility] DEPT Provider Note   CSN: [id number] Arrival date & time: 06/19/17  0910     History   Chief Complaint Chief Complaint  Patient presents with  . Abdominal Pain  . Diarrhea    HPI [name] is a 27 y.o. female.  She has a history of Crohn's.  She is presenting here today with 2 weeks of, nonspecific upper abdominal pain.  This acutely got worse about 3 days ago and moved to her left upper quadrant that was severe and associated with decreased appetite and nausea.  Also associated with nonbloody diarrhea.  There is been no fever no chills.  No sick contacts.  These symptoms are very atypical for her Crohn's disease.  For Crohn's she follows with Dr. Collene Mares from [place].  She was actually here 3 days ago and had lab work but no imaging.  She improved with a GI cocktail and was discharged.  The history is provided by the patient.  Abdominal Pain   This is a new problem. The current episode started more than 2 days ago. The problem occurs constantly. The problem has not changed since onset.The pain is associated with eating. The pain is located in the LUQ. The quality of the pain is sharp. The pain is at a severity of 7/10. Associated symptoms include diarrhea and nausea. Pertinent negatives include fever, hematochezia, melena, vomiting, constipation, dysuria, frequency, hematuria and arthralgias. Nothing aggravates the symptoms. Nothing relieves the symptoms.  Diarrhea   Associated symptoms include abdominal pain. Pertinent negatives include no vomiting, no chills, no arthralgias and no cough.    Past Medical History:  Diagnosis Date  . Anxiety   . Crohn's disease ([place])   . Diabetes type 1, uncontrolled ([place]) 11/14/2011   Since age 51  . Fibromyalgia   . Gastroparesis   . Hypertension   . Infection    UTI April 2016    Patient Active Problem List   Diagnosis Date Noted  . Intractable nausea and vomiting 04/11/2017  . Nausea and  vomiting 04/10/2017  . Essential hypertension   . Colitis   . Crohn's disease ([place]) 04/03/2017  . Abscess and cellulitis of gluteal region 03/24/2017  . Cellulitis and abscess of buttock 03/23/2017  . Malnutrition of moderate degree 03/19/2017  . Anasarca 03/16/2017  . 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 Diarrhea 02/27/2017  . Lower GI bleed 02/27/2017  . DKA (diabetic ketoacidosis) ([place]) 02/26/2017  . Acute metabolic encephalopathy   . DKA, type 1 ([place]) 06/11/2013  . Orthostatic hypotension dysautonomic syndrome ([place]) 04/28/2013  . Body aches 04/28/2013  . Orthostatic hypotension 04/28/2013  . Diabetes mellitus type 1 ([place]) 04/28/2013  . Protein-calorie malnutrition, severe ([place]) 04/28/2013  . Noncompliance with diabetes treatment 09/01/2012  . Dyspnea 02/28/2012  . Sinus tachycardia 02/28/2012  . Hyperglycemia without ketosis 02/28/2012  . Chest pain 12/04/2011  . Diabetes type 1, uncontrolled ([place]) 11/14/2011  . Hypertension associated with diabetes ([place]) 11/14/2011  . Gastroparesis 11/14/2011  . Hyponatremia 09/19/2011  . Abdominal pain 09/18/2011  . Metabolic alkalosis 40/98/1191  . Diabetic hyperosmolar non-ketotic state ([place]) 09/18/2011  . DIAB W/UNSPEC COMP TYPE I [JUV TYPE] UNCNTRL 06/04/2010  . CONSTIPATION 01/01/2010  . RECTAL PAIN 01/01/2010    Past Surgical History:  Procedure Laterality Date  . ANKLE SURGERY    . CHOLECYSTECTOMY  11/15/2011   Procedure: LAPAROSCOPIC CHOLECYSTECTOMY WITH INTRAOPERATIVE CHOLANGIOGRAM;  Surgeon: [name], MD;  Location: [facility];  Service: General;  Laterality: N/A;  . ESOPHAGOGASTRODUODENOSCOPY  12/03/2011   Procedure: ESOPHAGOGASTRODUODENOSCOPY (EGD);  Surgeon: Beryle Beams, MD;  Location: [facility];  Service: Endoscopy;  Laterality: N/A;  . FLEXIBLE SIGMOIDOSCOPY N/A 03/10/2017   Procedure: FLEXIBLE SIGMOIDOSCOPY;  Surgeon: Carol Ada, MD;  Location: [facility];  Service:  Endoscopy;  Laterality: N/A;  . INCISION AND DRAINAGE PERIRECTAL ABSCESS N/A 03/01/2017   Procedure: IRRIGATION AND DEBRIDEMENT PERIRECTAL ABSCESS;  Surgeon: Alphonsa Overall, MD;  Location: [facility];  Service: General;  Laterality: N/A;  . IRRIGATION AND DEBRIDEMENT BUTTOCKS N/A 03/23/2017   Procedure: IRRIGATION AND DEBRIDEMENT BUTTOCKS, SETON PLACEMENT;  Surgeon: [name], MD;  Location: [facility];  Service: General;  Laterality: N/A;  . LAPAROSCOPY  11/23/2011   Procedure: LAPAROSCOPY DIAGNOSTIC;  Surgeon: Edward Jolly, MD;  Location: [facility];  Service: General;  Laterality: N/A;    OB History    Gravida  1   Para  1   Term      Preterm  1   AB      Living  1     SAB      TAB      Ectopic      Multiple  0   Live Births  1            Home Medications    Prior to Admission medications   Medication Sig Start Date End Date Taking? Authorizing Provider  acetaminophen (TYLENOL) 500 MG tablet Take 1,000 mg by mouth daily as needed for moderate pain.     [provider]  alum & mag hydroxide-simeth (MAALOX ADVANCED MAX ST) 625-638-93 MG/5ML suspension Take 10 mLs by mouth every 6 (six) hours as needed for indigestion. Patient not taking: Reported on 06/16/2017 05/31/17   Fatima Blank, MD  cholestyramine light (PREVALITE) 4 g packet Take 1 packet (4 g total) by mouth 3 (three) times daily. Patient not taking: Reported on 05/13/2017 03/14/17   Lavina Hamman, MD  citalopram (CELEXA) 20 MG tablet Take 1 tablet (20 mg total) by mouth daily. 05/13/17   Henson, Vickie L, NP-C  cyclobenzaprine (FLEXERIL) 10 MG tablet Take 1 tablet (10 mg total) by mouth 2 (two) times daily as needed for muscle spasms. 05/21/17   Khatri, Hina, PA-C  diphenoxylate-atropine (LOMOTIL) 2.5-0.025 MG tablet Take 2 tablets by mouth 4 (four) times daily. Patient not taking: Reported on 06/16/2017 05/18/17   Harland Dingwall L, NP-C  EPINEPHrine (EPIPEN 2-PAK) 0.3 mg/0.3 mL IJ SOAJ  injection Inject 0.3 mLs (0.3 mg total) into the muscle as needed (allergic reaction). 03/21/17   Aline August, MD  ferrous sulfate 325 (65 FE) MG tablet Take 1 tablet (325 mg total) by mouth 2 (two) times daily with a meal. 05/13/17   Henson, Vickie L, NP-C  folic acid (FOLVITE) 1 MG tablet Take 1 tablet (1 mg total) by mouth daily. 05/13/17   Henson, Vickie L, NP-C  furosemide (LASIX) 40 MG tablet Take 1 tablet (40 mg total) by mouth daily. 05/13/17 06/12/17  Henson, Vickie L, NP-C  furosemide (LASIX) 40 MG tablet Take 40 mg by mouth daily.    [provider]  gabapentin (NEURONTIN) 300 MG capsule Take 1 capsule (300 mg total) by mouth 2 (two) times daily. 04/03/17   Henson, Vickie L, NP-C  glucose 4 GM chewable tablet Chew 1 tablet (4 g total) by mouth as needed for low blood sugar. 08/10/14   Denney, Rachelle A, CNM  insulin lispro (HUMALOG)  100 UNIT/ML injection Inject 0.02 mLs (2 Units total) into the skin 3 (three) times daily with meals. Patient taking differently: Inject 1-9 Units into the skin 3 (three) times daily with meals. Sliding scale 04/09/17   Renato Shin, MD  LANTUS 100 UNIT/ML injection Inject 0.18 mLs (18 Units total) into the skin every morning. 05/07/17   Renato Shin, MD  lidocaine (LIDODERM) 5 % Place 1 patch onto the skin daily. Remove & Discard patch within 12 hours or as directed by MD 05/21/17   Delia Heady, PA-C  lidocaine (XYLOCAINE) 2 % solution Use as directed 15 mLs in the mouth or throat every 6 (six) hours as needed (stomach pain). 05/31/17   Fatima Blank, MD  lipase/protease/amylase (CREON) 36000 UNITS CPEP capsule Take 1 capsule (36,000 Units total) by mouth 3 (three) times daily with meals. 03/14/17   Lavina Hamman, MD  loperamide (IMODIUM) 2 MG capsule Take 1 capsule (2 mg total) by mouth 3 (three) times daily before meals. DO NOT TAKE IT FOR THE DAY YOU DONT HAVE A BOWEL MOVEMENT. 04/03/17   Henson, Vickie L, NP-C  methocarbamol (ROBAXIN) 500 MG  tablet Take 1 tablet (500 mg total) by mouth every 8 (eight) hours as needed for muscle spasms. 03/14/17   Lavina Hamman, MD  metoCLOPramide (REGLAN) 5 MG tablet Take 5 mg by mouth every 6 (six) hours as needed for nausea or vomiting.     [provider]  oxyCODONE-acetaminophen (PERCOCET/ROXICET) 5-325 MG tablet Take 1 tablet by mouth every 8 (eight) hours as needed for severe pain. Patient not taking: Reported on 05/21/2017 05/14/17   McDonald, Maree Erie A, PA-C  oxyCODONE-acetaminophen (PERCOCET/ROXICET) 5-325 MG tablet Take 1 tablet by mouth every 4 (four) hours as needed for severe pain. Patient not taking: Reported on 06/16/2017 05/22/17   Tegeler, Gwenyth Allegra, MD  promethazine (PHENERGAN) 12.5 MG tablet Take 1 tablet (12.5 mg total) by mouth every 6 (six) hours as needed for nausea or vomiting. 05/13/17   Henson, Vickie L, NP-C  sucralfate (CARAFATE) 1 GM/10ML suspension Take 10 mLs (1 g total) by mouth 4 (four) times daily -  with meals and at bedtime. 06/17/17   Charlann Lange, PA-C    Family History Family History  Problem Relation Age of Onset  . Diabetes Mother   . Hypertension Father     Social History Social History   Tobacco Use  . Smoking status: Never Smoker  . Smokeless tobacco: Never Used  Substance Use Topics  . Alcohol use: No    Alcohol/week: 0.0 oz    Frequency: Never  . Drug use: No     Allergies   Peanut-containing drug products and Lactose intolerance (gi)   Review of Systems Review of Systems  Constitutional: Negative for chills and fever.  HENT: Negative for ear pain and sore throat.   Eyes: Negative for pain and visual disturbance.  Respiratory: Negative for cough and shortness of breath.   Cardiovascular: Negative for chest pain and palpitations.  Gastrointestinal: Positive for abdominal pain, diarrhea and nausea. Negative for constipation, hematochezia, melena and vomiting.  Genitourinary: Negative for dysuria, frequency and hematuria.    Musculoskeletal: Negative for arthralgias and back pain.  Skin: Negative for color change and rash.  Neurological: Negative for seizures and syncope.  All other systems reviewed and are negative.    Physical Exam Updated Vital Signs There were no vitals taken for this visit.  Physical Exam  Constitutional: She appears well-developed and well-nourished. No distress.  HENT:  Head: Normocephalic and atraumatic.  Eyes: Conjunctivae are normal.  Neck: Neck supple.  Cardiovascular: Normal rate and regular rhythm.  No murmur heard. Pulmonary/Chest: Effort normal and breath sounds normal. No respiratory distress.  Abdominal: Soft. There is generalized tenderness. There is no rigidity and no guarding.  Musculoskeletal: She exhibits no edema, tenderness or deformity.  Neurological: She is alert.  Skin: Skin is warm and dry.  Psychiatric: She has a normal mood and affect.  Nursing note and vitals reviewed.    ED Treatments / Results  Labs (all labs ordered are listed, but only abnormal results are displayed) Labs Reviewed  COMPREHENSIVE METABOLIC PANEL - Abnormal; Notable for the following components:      Result Value   Potassium 3.4 (*)    Chloride 100 (*)    Glucose, Bld 201 (*)    BUN 5 (*)    All other components within normal limits  CBC - Abnormal; Notable for the following components:   Hemoglobin 11.0 (*)    HCT 32.1 (*)    All other components within normal limits  URINALYSIS, ROUTINE W REFLEX MICROSCOPIC - Abnormal; Notable for the following components:   Glucose, UA >=500 (*)    Hgb urine dipstick MODERATE (*)    Protein, ur 30 (*)    Leukocytes, UA SMALL (*)    Bacteria, UA RARE (*)    Squamous Epithelial / LPF 0-5 (*)    All other components within normal limits  CBG MONITORING, ED - Abnormal; Notable for the following components:   Glucose-Capillary 196 (*)    All other components within normal limits  LIPASE, BLOOD  I-STAT BETA HCG BLOOD, ED ([facility], [facility], [facility]  ONLY)    EKG  EKG Interpretation None       Radiology Ct Abdomen Pelvis W Contrast  Result Date: 06/19/2017 CLINICAL DATA:  Left mid abdominal pain and diarrhea 1 week. Crohn's disease. EXAM: CT ABDOMEN AND PELVIS WITH CONTRAST TECHNIQUE: Multidetector CT imaging of the abdomen and pelvis was performed using the standard protocol following bolus administration of intravenous contrast. CONTRAST:  180m ISOVUE-300 IOPAMIDOL (ISOVUE-300) INJECTION 61% COMPARISON:  05/27/2017 and 01/15/2016 as well as 04/10/2017 FINDINGS: Lower chest: Lung bases demonstrate minimal opacification of the left lower lobe likely atelectasis. No effusion. Hepatobiliary: Previous cholecystectomy. Liver and biliary tree are normal. Pancreas: Normal. Spleen: Normal. Adrenals/Urinary Tract: Adrenal glands are normal. Kidneys are normal in size without hydronephrosis or nephrolithiasis. Ureters and bladder are unremarkable. Stomach/Bowel: Stomach is normal. Small bowel is within normal. Appendix is normal. There is minimal wall thickening throughout the colon likely due to mild colitis associated due to patient's Crohn's disease. No free fluid or free peritoneal air. Vascular/Lymphatic: Within normal. Reproductive: Within normal. 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 Musculoskeletal: Within normal. IMPRESSION: Mild wall thickening throughout the colon likely mild acute colitis due to patient's Crohn's disease. Changes over the subcutaneous region of the right gluteal region with surgical drain without significant change. 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 Minimal left base opacification likely atelectasis. Early infection is possible. Electronically Signed   By: DMarin OlpM.D.   On: 06/19/2017 14:30    Procedures Procedures  (including critical care time)  Medications Ordered in ED Medications  morphine 4 MG/ML injection 4 mg (has no administration in time range)  sodium chloride 0.9 %  bolus 1,000 mL (has no administration in time range)  ondansetron (ZOFRAN) injection 4 mg (has no administration in time range)     Initial Impression / Assessment and Plan / ED Course  I have reviewed the triage vital signs and the nursing notes.  Pertinent labs & imaging results that were available during my care of the patient were reviewed by me and considered in my medical decision making (see chart for details).  Clinical Course as of Jun 19 1899  Fri Jun 19, 2017  1133 Patient with difficult IV access.  She is hoping to get some IV pain medicine.  Her lab work all looks very unremarkable.   [MB]  1440 Patient's CAT scan was significant for some mild colonic thickening consistent with colitis.  I reevaluated the patient her pain is improved.  She states her GI doctor is Dr. Collene Mares and saw him attempted to call her to see if she can help with disposition on this patient.   [MB]  3500 Discussed with coverage for Dr. Collene Mares.  He felt that may be difficult to say with the patient not having a clear-cut pattern of her Crohn's but if she is not on immune suppressants and without clearcut abnormalities on CAT scan,  it would not be unreasonable to start her on some oral antibiotics and have her follow-up with Dr. Collene Mares next week.   [MB]    Clinical Course User Index [MB] [name], MD     Final Clinical Impressions(s) / ED Diagnoses   Final diagnoses:  Generalized abdominal pain  Nausea vomiting and diarrhea    ED Discharge Orders        Ordered    ciprofloxacin (CIPRO) 500 MG tablet  2 times daily     06/19/17 1501    metroNIDAZOLE (FLAGYL) 500 MG tablet  2 times daily     06/19/17 1501       [name], MD 06/19/17 1901

## 2017-06-19 NOTE — ED Notes (Addendum)
This RN attempted to gain IV access using the [place] IV machine unsuccessfully. Patient tolerated it well. Will ask Lilia Pro, RN for help gaining IV access.

## 2017-06-19 NOTE — ED Notes (Signed)
Patient transported to CT 

## 2017-06-19 NOTE — ED Triage Notes (Signed)
Patient c/o left mid abdominal pain and diarrhea x 1 week. Patient denies any blood in her stool.

## 2017-06-19 NOTE — Discharge Instructions (Signed)
Your evaluated in the emergency department for abdominal pain with associated nausea vomiting and diarrhea.  You had a CAT scan and lab work to evaluate your symptoms.  Your CAT scan showed some thickening of your large intestine and we reviewed these findings with the gastroenterologist who works with Dr. Collene Mares.  He felt we could start you on antibiotics and have you follow-up with Dr. Collene Mares next week.  You should return to the emergency department if any worsening symptoms.

## 2017-06-20 ENCOUNTER — Emergency Department (HOSPITAL_COMMUNITY)
Admission: EM | Admit: 2017-06-20 | Discharge: 2017-06-20 | Disposition: A | Discharge status: Home / Self Care | Payer: 59 | Attending: Emergency Medicine | Admitting: Emergency Medicine

## 2017-06-20 ENCOUNTER — Encounter (HOSPITAL_COMMUNITY): Payer: Self-pay | Admitting: *Deleted

## 2017-06-20 DIAGNOSIS — E109 Type 1 diabetes mellitus without complications: Secondary | ICD-10-CM | POA: Insufficient documentation

## 2017-06-20 DIAGNOSIS — I10 Essential (primary) hypertension: Secondary | ICD-10-CM | POA: Diagnosis not present

## 2017-06-20 DIAGNOSIS — R110 Nausea: Secondary | ICD-10-CM | POA: Diagnosis not present

## 2017-06-20 DIAGNOSIS — R109 Unspecified abdominal pain: Secondary | ICD-10-CM | POA: Diagnosis not present

## 2017-06-20 DIAGNOSIS — R197 Diarrhea, unspecified: Secondary | ICD-10-CM | POA: Diagnosis not present

## 2017-06-20 DIAGNOSIS — R1084 Generalized abdominal pain: Secondary | ICD-10-CM | POA: Insufficient documentation

## 2017-06-20 DIAGNOSIS — Z79899 Other long term (current) drug therapy: Secondary | ICD-10-CM | POA: Diagnosis not present

## 2017-06-20 MED ORDER — MORPHINE SULFATE (PF) 4 MG/ML IV SOLN
4.0000 mg | Freq: Once | INTRAVENOUS | Status: AC
  Administered 2017-06-20: 4 mg via INTRAMUSCULAR
  Filled 2017-06-20: qty 1

## 2017-06-20 MED ORDER — PROMETHAZINE HCL 25 MG PO TABS: 25.0000 mg | ORAL_TABLET | Freq: Four times a day (QID) | ORAL | 0 refills | Status: DC | PRN

## 2017-06-20 MED ORDER — PROMETHAZINE HCL 25 MG/ML IJ SOLN
12.5000 mg | Freq: Once | INTRAMUSCULAR | Status: AC
  Administered 2017-06-20: 12.5 mg via INTRAMUSCULAR
  Filled 2017-06-20: qty 1

## 2017-06-20 MED ORDER — OXYCODONE-ACETAMINOPHEN 5-325 MG PO TABS: 1.0000 | ORAL_TABLET | Freq: Four times a day (QID) | ORAL | 0 refills | Status: DC | PRN

## 2017-06-20 MED ORDER — SODIUM CHLORIDE 0.9 % IV BOLUS (SEPSIS)
1000.0000 mL | Freq: Once | INTRAVENOUS | Status: AC
  Administered 2017-06-20: 1000 mL via INTRAVENOUS

## 2017-06-20 MED ORDER — MORPHINE SULFATE (PF) 4 MG/ML IV SOLN
4.0000 mg | Freq: Once | INTRAVENOUS | Status: AC
Start: 2017-06-20 — End: 2017-06-20
  Administered 2017-06-20: 4 mg via INTRAVENOUS
  Filled 2017-06-20: qty 1

## 2017-06-20 MED ORDER — MORPHINE SULFATE (PF) 2 MG/ML IV SOLN
2.0000 mg | Freq: Once | INTRAVENOUS | Status: AC
  Administered 2017-06-20: 2 mg via INTRAVENOUS
  Filled 2017-06-20: qty 1

## 2017-06-20 NOTE — Discharge Instructions (Signed)
You were seen in the emergency department today for abdominal pain, nausea, and diarrhea.  Your symptoms improved with morphine, Phenergan, and fluids.  We did not repeat any labs or imaging given your visit yesterday.  We are sending you home with 2 prescriptions to treat your symptoms. - Phenergan: Take this every 6 hours as needed for vomiting. - Percocet: Take this every 6 hours as needed for pain.  Take the antibiotics that were prescribed to you yesterday. We have prescribed you new medication(s) today. Discuss the medications prescribed today with your pharmacist as they can have adverse effects and interactions with your other medicines including over the counter and prescribed medications. Seek medical evaluation if you start to experience new or abnormal symptoms after taking one of these medicines, seek care immediately if you start to experience difficulty breathing, feeling of your throat closing, facial swelling, or rash as these could be indications of a more serious allergic reaction.  Follow-up with your gastroenterologist Dr. Loreta Ave this week as discussed.  Return to the emergency department for new or worsening symptoms including but not limited to worsening pain, inability to keep fluids down, or any other concerns you may have.

## 2017-06-20 NOTE — ED Notes (Signed)
Pt will be PO challenged per PA orders.

## 2017-06-20 NOTE — ED Provider Notes (Signed)
[facility]-EMERGENCY DEPT Provider Note   CSN: [id number] Arrival date & time: 06/20/17  8466     History   Chief Complaint Chief Complaint  [name] presents with  . Abdominal Pain    HPI [name] is a 26 y.o. female. with a hx of T1DM, Crohn's disease, gastroparesis, HTN, sinus tachycardia, cholecystectomy, and fibromyalgia who returns to the ED for continued abdominal discomfort, nausea, and diarrhea since visit yesterday. [name] has had upper abdominal discomfort x 2 weeks, worse over past 4 days. Seen in the ED yesterday, had lab work as well as CT abdomen/pelvis- CT revealed mild wall thickening throughout the colon with likely mild acute colitis due to [name]'s Crohn's disease. ED physician spoke with on call provider for [name]'s gastroenterologist- formulated plan for DC home with abx (Cipro/Flagyl) and follow up with Dr. Loreta Ave next week. [name] returns today due to continued discomfort, nausea, and nonbloody diarrhea. States she was planning to start her abx this AM, but did not feel she could secondary to pain/nausea prompting ED visit. Pain is in same location 10/10 in severity, worse when attempting to eat. Tried tylenol without relief. No other alleviating/aggravating factors. Denies vomiting, fever, dysuria, or vaginal bleeding  HPI  Past Medical History:  Diagnosis Date  . Anxiety   . Crohn's disease (HCC)   . Diabetes type 1, uncontrolled (HCC) 11/14/2011   Since age 79  . Fibromyalgia   . Gastroparesis   . Hypertension   . Infection    UTI April 2016    [name] Active Problem List   Diagnosis Date Noted  . Intractable nausea and vomiting 04/11/2017  . Nausea and vomiting 04/10/2017  . Essential hypertension   . Colitis   . Crohn's disease (HCC) 04/03/2017  . Abscess and cellulitis of gluteal region 03/24/2017  . Cellulitis and abscess of buttock 03/23/2017  . Malnutrition of moderate degree 03/19/2017  . Anasarca 03/16/2017  .  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 Diarrhea 02/27/2017  . Lower GI bleed 02/27/2017  . DKA (diabetic ketoacidosis) (HCC) 02/26/2017  . Acute metabolic encephalopathy   . DKA, type 1 (HCC) 06/11/2013  . Orthostatic hypotension dysautonomic syndrome (HCC) 04/28/2013  . Body aches 04/28/2013  . Orthostatic hypotension 04/28/2013  . Diabetes mellitus type 1 (HCC) 04/28/2013  . Protein-calorie malnutrition, severe (HCC) 04/28/2013  . Noncompliance with diabetes treatment 09/01/2012  . Dyspnea 02/28/2012  . Sinus tachycardia 02/28/2012  . Hyperglycemia without ketosis 02/28/2012  . Chest pain 12/04/2011  . Diabetes type 1, uncontrolled (HCC) 11/14/2011  . Hypertension associated with diabetes (HCC) 11/14/2011  . Gastroparesis 11/14/2011  . Hyponatremia 09/19/2011  . Abdominal pain 09/18/2011  . Metabolic alkalosis 09/18/2011  . Diabetic hyperosmolar non-ketotic state (HCC) 09/18/2011  . DIAB W/UNSPEC COMP TYPE I [JUV TYPE] UNCNTRL 06/04/2010  . CONSTIPATION 01/01/2010  . RECTAL PAIN 01/01/2010    Past Surgical History:  Procedure Laterality Date  . ANKLE SURGERY    . CHOLECYSTECTOMY  11/15/2011   Procedure: LAPAROSCOPIC CHOLECYSTECTOMY WITH INTRAOPERATIVE CHOLANGIOGRAM;  Surgeon: [name], MD;  Location: [facility];  Service: General;  Laterality: N/A;  . ESOPHAGOGASTRODUODENOSCOPY  12/03/2011   Procedure: ESOPHAGOGASTRODUODENOSCOPY (EGD);  Surgeon: Theda Belfast, MD;  Location: [facility];  Service: Endoscopy;  Laterality: N/A;  . FLEXIBLE SIGMOIDOSCOPY N/A 03/10/2017   Procedure: FLEXIBLE SIGMOIDOSCOPY;  Surgeon: Jeani Hawking, MD;  Location: [facility];  Service: Endoscopy;  Laterality: N/A;  . INCISION AND DRAINAGE PERIRECTAL ABSCESS N/A  03/01/2017   Procedure: IRRIGATION AND DEBRIDEMENT PERIRECTAL ABSCESS;  Surgeon: Ovidio Kin, MD;  Location: [facility];  Service: General;  Laterality: N/A;  . IRRIGATION AND DEBRIDEMENT BUTTOCKS N/A  03/23/2017   Procedure: IRRIGATION AND DEBRIDEMENT BUTTOCKS, SETON PLACEMENT;  Surgeon: Romie Levee, MD;  Location: [facility];  Service: General;  Laterality: N/A;  . LAPAROSCOPY  11/23/2011   Procedure: LAPAROSCOPY DIAGNOSTIC;  Surgeon: Mariella Saa, MD;  Location: [facility];  Service: General;  Laterality: N/A;     OB History    Gravida  1   Para  1   Term      Preterm  1   AB      Living  1     SAB      TAB      Ectopic      Multiple  0   Live Births  1            Home Medications    Prior to Admission medications   Medication Sig Start Date End Date Taking? Authorizing Provider  acetaminophen (TYLENOL) 500 MG tablet Take 1,000 mg by mouth daily as needed for moderate pain.    Yes [provider]  alum & mag hydroxide-simeth (MAALOX ADVANCED MAX ST) 400-400-40 MG/5ML suspension Take 10 mLs by mouth every 6 (six) hours as needed for indigestion. 05/31/17  Yes Cardama, Amadeo Garnet, MD  cholestyramine light (PREVALITE) 4 g packet Take 1 packet (4 g total) by mouth 3 (three) times daily. [name] taking differently: Take 4 g by mouth 2 (two) times daily as needed (diarrhea).  03/14/17  Yes Rolly Salter, MD  ciprofloxacin (CIPRO) 500 MG tablet Take 1 tablet (500 mg total) by mouth 2 (two) times daily. 06/19/17  Yes Terrilee Files, MD  citalopram (CELEXA) 20 MG tablet Take 1 tablet (20 mg total) by mouth daily. 05/13/17  Yes Henson, Vickie L, NP-C  cyclobenzaprine (FLEXERIL) 10 MG tablet Take 1 tablet (10 mg total) by mouth 2 (two) times daily as needed for muscle spasms. 05/21/17  Yes Khatri, Hina, PA-C  diphenoxylate-atropine (LOMOTIL) 2.5-0.025 MG tablet Take 2 tablets by mouth 4 (four) times daily. 05/18/17  Yes Henson, Vickie L, NP-C  EPINEPHrine (EPIPEN 2-PAK) 0.3 mg/0.3 mL IJ SOAJ injection Inject 0.3 mLs (0.3 mg total) into the muscle as needed (allergic reaction). 03/21/17  Yes Glade Lloyd, MD  ferrous sulfate 325 (65 FE) MG tablet Take 1 tablet  (325 mg total) by mouth 2 (two) times daily with a meal. 05/13/17  Yes Henson, Vickie L, NP-C  folic acid (FOLVITE) 1 MG tablet Take 1 tablet (1 mg total) by mouth daily. 05/13/17  Yes Henson, Vickie L, NP-C  furosemide (LASIX) 40 MG tablet Take 1 tablet (40 mg total) by mouth daily. 05/13/17 06/20/17 Yes Henson, Vickie L, NP-C  gabapentin (NEURONTIN) 300 MG capsule Take 1 capsule (300 mg total) by mouth 2 (two) times daily. 04/03/17  Yes Henson, Vickie L, NP-C  glucose 4 GM chewable tablet Chew 1 tablet (4 g total) by mouth as needed for low blood sugar. 08/10/14  Yes Denney, Rachelle A, CNM  insulin lispro (HUMALOG) 100 UNIT/ML injection Inject 0.02 mLs (2 Units total) into the skin 3 (three) times daily with meals. [name] taking differently: Inject 0-9 Units into the skin 3 (three) times daily with meals. Sliding scale 04/09/17  Yes Romero Belling, MD  LANTUS 100 UNIT/ML injection Inject 0.18 mLs (18 Units total) into the skin every morning. 05/07/17  Yes Romero Belling, MD  lidocaine (LIDODERM) 5 % Place 1 patch onto the skin daily. Remove & Discard patch within 12 hours or as directed by MD [name] taking differently: Place 1 patch onto the skin daily as needed (pain). Remove & Discard patch within 12 hours or as directed by MD 05/21/17  Yes Khatri, Hina, PA-C  lidocaine (XYLOCAINE) 2 % solution Use as directed 15 mLs in the mouth or throat every 6 (six) hours as needed (stomach pain). 05/31/17  Yes Cardama, Amadeo Garnet, MD  lipase/protease/amylase (CREON) 36000 UNITS CPEP capsule Take 1 capsule (36,000 Units total) by mouth 3 (three) times daily with meals. 03/14/17  Yes Rolly Salter, MD  loperamide (IMODIUM) 2 MG capsule Take 1 capsule (2 mg total) by mouth 3 (three) times daily before meals. DO NOT TAKE IT FOR THE DAY YOU DONT HAVE A BOWEL MOVEMENT. 04/03/17  Yes Henson, Vickie L, NP-C  metoCLOPramide (REGLAN) 5 MG tablet Take 5 mg by mouth every 6 (six) hours as needed for nausea or vomiting.    Yes  [provider]  metroNIDAZOLE (FLAGYL) 500 MG tablet Take 1 tablet (500 mg total) by mouth 2 (two) times daily. 06/19/17  Yes Terrilee Files, MD  sucralfate (CARAFATE) 1 GM/10ML suspension Take 10 mLs (1 g total) by mouth 4 (four) times daily -  with meals and at bedtime. 06/17/17  Yes Elpidio Anis, PA-C  oxyCODONE-acetaminophen (PERCOCET/ROXICET) 5-325 MG tablet Take 1 tablet by mouth every 6 (six) hours as needed for severe pain. 06/20/17   Kaiulani Sitton, Pleas Koch, PA-C  promethazine (PHENERGAN) 25 MG tablet Take 1 tablet (25 mg total) by mouth every 6 (six) hours as needed for nausea or vomiting. 06/20/17   Lachae Hohler, Pleas Koch, PA-C    Family History Family History  Problem Relation Age of Onset  . Diabetes Mother   . Hypertension Father     Social History Social History   Tobacco Use  . Smoking status: Never Smoker  . Smokeless tobacco: Never Used  Substance Use Topics  . Alcohol use: No    Alcohol/week: 0.0 oz    Frequency: Never  . Drug use: No     Allergies   Peanut-containing drug products and Lactose intolerance (gi)   Review of Systems Review of Systems Constitutional: Negative for fever.  Respiratory: Negative for shortness of breath.   Cardiovascular: Negative for chest pain.  Gastrointestinal: Positive for abdominal pain, diarrhea and nausea. Negative for blood in stool and vomiting.  Genitourinary: Negative for dysuria, vaginal bleeding and vaginal discharge.  All other systems reviewed and are negative.  Physical Exam Updated Vital Signs BP (!) 130/99 (BP Location: Right Arm)   Pulse (!) 117   Temp 97.8 F (36.6 C) (Oral)   Resp 16   SpO2 100%   Physical Exam Constitutional: She appears well-developed and well-nourished.  Non-toxic appearance.  HENT:  Head: Normocephalic and atraumatic.  Eyes: Conjunctivae are normal. Right eye exhibits no discharge. Left eye exhibits no discharge.  Cardiovascular: Normal rate and regular rhythm.    No murmur heard. Pulmonary/Chest: Breath sounds normal. No respiratory distress. She has no wheezes. She has no rales.  Abdominal: Soft. She exhibits no distension. There is generalized tenderness. There is no rigidity, no rebound, no guarding and no tenderness at McBurney's point.  Neurological: She is alert.  Clear speech.   Skin: Skin is warm and dry. No rash noted.  Psychiatric: She has a normal mood and affect. Her behavior is normal.  Nursing note and vitals reviewed.  ED Treatments / Results  Labs (all labs ordered are listed, but only abnormal results are displayed) Labs Reviewed - No data to display  EKG None  Radiology Ct Abdomen Pelvis W Contrast  Result Date: 06/19/2017 CLINICAL DATA:  Left mid abdominal pain and diarrhea 1 week. Crohn's disease. EXAM: CT ABDOMEN AND PELVIS WITH CONTRAST TECHNIQUE: Multidetector CT imaging of the abdomen and pelvis was performed using the standard protocol following bolus administration of intravenous contrast. CONTRAST:  ISOVUE-300 IOPAMIDOL (ISOVUE-300) INJECTION 61% COMPARISON:  05/27/2017 and 01/15/2016 as well as 04/10/2017 FINDINGS: Lower chest: Lung bases demonstrate minimal opacification of the left lower lobe likely atelectasis. No effusion. Hepatobiliary: Previous cholecystectomy. Liver and biliary tree are normal. Pancreas: Normal. Spleen: Normal. Adrenals/Urinary Tract: Adrenal glands are normal. Kidneys are normal in size without hydronephrosis or nephrolithiasis. Ureters and bladder are unremarkable. Stomach/Bowel: Stomach is normal. Small bowel is within normal. Appendix is normal. There is minimal wall thickening throughout the colon likely due to mild colitis associated due to [name]'s Crohn's disease. No free fluid or free peritoneal air. Vascular/Lymphatic: Within normal. Reproductive: Within normal. 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 Musculoskeletal: Within normal. IMPRESSION: Mild wall thickening throughout the colon likely mild acute colitis due to [name]'s Crohn's disease. Changes over the subcutaneous region of the right gluteal region with surgical drain without significant change. 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 Minimal left base opacification likely atelectasis. Early infection is possible. Electronically Signed   By: Elberta Fortis M.D.   On: 06/19/2017 14:30    Procedures Procedures (including critical care time)  Medications Ordered in ED Medications  morphine 4 MG/ML injection 4 mg (4 mg Intramuscular Given 06/20/17 1020)  promethazine (PHENERGAN) injection 12.5 mg (12.5 mg Intramuscular Given 06/20/17 1022)  morphine 4 MG/ML injection 4 mg (4 mg Intravenous Given 06/20/17 1239)  sodium chloride 0.9 % bolus 1,000 mL (0 mLs Intravenous Stopped 06/20/17 1520)  morphine 2 MG/ML injection 2 mg (2 mg Intravenous Given 06/20/17 1519)    Initial Impression / Assessment and Plan / ED Course  I have reviewed the triage vital signs and the nursing notes.  Pertinent labs & imaging results that were available during my care of the [name] were reviewed by me and considered in my medical decision making (see chart for details).   [name] presents with continued abdominal pain, nausea, and diarrhea since ED visit yesterday with diagnosis of mild acute colitis due to Crohn's disease. [name] is nontoxic appearing, she is tachycardic and hypertensive, no indication of HTN emergency, review of [name]'s visits and per her medical hx she is consistently tachycardic. ED visit labs and CT scan from yesterday reviewed. Pain is of same quality/location/general severity as yesterday. No peritoneal signs on abdominal exam, doubt perforation or abscess, do not think repeat blood work and/or imaging is necessary at this time.  Will treat [name] supportively with morphine and phenergan she is in agreement with this.   12:30: RE-EVAL: [name]'s pain somewhat improved, nausea fairly resolved. Will order additional pain medication and fluids at this time.   14:15: RE-EVAL: [name] feeling much better, still with some discomfort. Will give additional 2mg  of Morphine and PO challenge.   14:40: RE-EVAL: [name] is tolerating PO she feels she is ready to go home.   Will DC home with short  term prescriptions for phenergan and percocet. [place] Controlled Substance reporting System queried- [name] with multiple short term prescriptions for narcotic medications- these have all seemed to be occurring more recently since diagnosis of Crohns- discussed with [name] the importance of follow up with her gastroenterologist. Will have her start abx initiated at last ED visit. I discussed treatment plan, need for PCP follow-up, and return precautions with the [name] and her husband. Provided opportunity for questions, [name] and her husband confirmed understanding and are in agreement with plan.   Findings and plan of care discussed with supervising physician Dr. Denton Lank who is in agreement with plan.   Final Clinical Impressions(s) / ED Diagnoses   Final diagnoses:  Abdominal pain, unspecified abdominal location    ED Discharge Orders        Ordered    oxyCODONE-acetaminophen (PERCOCET/ROXICET) 5-325 MG tablet  Every 6 hours PRN     06/20/17 1457    promethazine (PHENERGAN) 25 MG tablet  Every 6 hours PRN     06/20/17 1457       Eliese Kerwood, [place] R, PA-C 06/20/17 1643    Cathren Laine, MD 06/21/17 [phone]

## 2017-06-20 NOTE — ED Triage Notes (Signed)
Pt was seen yesterday for Crohn flair up, was sent home with abx. Pt complains of diarrhea and abdominal pain since going home and eating yesterday. Pt states she hasn't been able to eat as much and has been unable to take abx.

## 2017-06-21 ENCOUNTER — Other Ambulatory Visit: Payer: Self-pay

## 2017-06-21 ENCOUNTER — Encounter (HOSPITAL_COMMUNITY): Payer: Self-pay | Admitting: Emergency Medicine

## 2017-06-21 ENCOUNTER — Emergency Department (HOSPITAL_COMMUNITY)
Admission: EM | Admit: 2017-06-21 | Discharge: 2017-06-21 | Disposition: A | Discharge status: Home / Self Care | Payer: 59 | Source: Home / Self Care | Attending: Emergency Medicine | Admitting: Emergency Medicine

## 2017-06-21 DIAGNOSIS — Z794 Long term (current) use of insulin: Secondary | ICD-10-CM | POA: Insufficient documentation

## 2017-06-21 DIAGNOSIS — E109 Type 1 diabetes mellitus without complications: Secondary | ICD-10-CM

## 2017-06-21 DIAGNOSIS — R197 Diarrhea, unspecified: Secondary | ICD-10-CM

## 2017-06-21 DIAGNOSIS — R1084 Generalized abdominal pain: Secondary | ICD-10-CM | POA: Insufficient documentation

## 2017-06-21 DIAGNOSIS — I10 Essential (primary) hypertension: Secondary | ICD-10-CM

## 2017-06-21 DIAGNOSIS — R110 Nausea: Secondary | ICD-10-CM

## 2017-06-21 DIAGNOSIS — Z79899 Other long term (current) drug therapy: Secondary | ICD-10-CM | POA: Insufficient documentation

## 2017-06-21 DIAGNOSIS — Z91010 Allergy to peanuts: Secondary | ICD-10-CM

## 2017-06-21 LAB — CBC
HCT: 31.1 % — ABNORMAL LOW (ref 36.0–46.0)
Hemoglobin: 10.9 g/dL — ABNORMAL LOW (ref 12.0–15.0)
MCH: 28.6 pg (ref 26.0–34.0)
MCHC: 35 g/dL (ref 30.0–36.0)
MCV: 81.6 fL (ref 78.0–100.0)
Platelets: 187 10*3/uL (ref 150–400)
RBC: 3.81 MIL/uL — ABNORMAL LOW (ref 3.87–5.11)
RDW: 13 % (ref 11.5–15.5)
WBC: 3.9 10*3/uL — ABNORMAL LOW (ref 4.0–10.5)

## 2017-06-21 LAB — COMPREHENSIVE METABOLIC PANEL
ALT: 14 U/L (ref 14–54)
AST: 20 U/L (ref 15–41)
Albumin: 3.3 g/dL — ABNORMAL LOW (ref 3.5–5.0)
Alkaline Phosphatase: 80 U/L (ref 38–126)
Anion gap: 14 (ref 5–15)
BUN: <5 mg/dL — ABNORMAL LOW (ref 6–20)
CO2: 26 mmol/L (ref 22–32)
Calcium: 9 mg/dL (ref 8.9–10.3)
Chloride: 96 mmol/L — ABNORMAL LOW (ref 101–111)
Creatinine, Ser: 0.6 mg/dL (ref 0.44–1.00)
GFR calc Af Amer: >60 mL/min (ref >60)
GFR calc non Af Amer: >60 mL/min (ref >60)
Glucose, Bld: 169 mg/dL — ABNORMAL HIGH (ref 65–99)
Potassium: 3.3 mmol/L — ABNORMAL LOW (ref 3.5–5.1)
Sodium: 136 mmol/L (ref 135–145)
Total Bilirubin: 0.8 mg/dL (ref 0.3–1.2)
Total Protein: 7.4 g/dL (ref 6.5–8.1)

## 2017-06-21 LAB — I-STAT BETA HCG BLOOD, ED (MC, WL, AP ONLY): I-stat hCG, quantitative: <5 m[IU]/mL (ref <5)

## 2017-06-21 LAB — LIPASE, BLOOD: Lipase: 20 U/L (ref 11–51)

## 2017-06-21 MED ORDER — GI COCKTAIL ~~LOC~~
30.0000 mL | Freq: Once | ORAL | Status: AC
  Administered 2017-06-21: 30 mL via ORAL
  Filled 2017-06-21: qty 30

## 2017-06-21 MED ORDER — PANTOPRAZOLE SODIUM 40 MG IV SOLR
40.0000 mg | Freq: Once | INTRAVENOUS | Status: AC
  Administered 2017-06-21: 40 mg via INTRAVENOUS
  Filled 2017-06-21: qty 40

## 2017-06-21 MED ORDER — HYDROMORPHONE HCL 1 MG/ML IJ SOLN
0.5000 mg | Freq: Once | INTRAMUSCULAR | Status: AC
Start: 2017-06-21 — End: 2017-06-21
  Administered 2017-06-21: 0.5 mg via INTRAVENOUS
  Filled 2017-06-21: qty 1

## 2017-06-21 MED ORDER — SODIUM CHLORIDE 0.9 % IV BOLUS (SEPSIS)
1000.0000 mL | Freq: Once | INTRAVENOUS | Status: AC
  Administered 2017-06-21: 1000 mL via INTRAVENOUS

## 2017-06-21 MED ORDER — FAMOTIDINE 40 MG PO TABS: 40.0000 mg | ORAL_TABLET | Freq: Two times a day (BID) | ORAL | 0 refills | Status: DC

## 2017-06-21 NOTE — ED Notes (Signed)
Peripheral IV somewhat positional. Secured with tegaderm and plastic tape. Blood return noted and no redness or swelling noted when flushing. Pt denied pain when flushing line

## 2017-06-21 NOTE — ED Notes (Signed)
Pt reports severe epigastric and LUQ pain x 4 days. Seen twice in ED for same. Pt also c/o liquid stools. Pt reports she has not been able to eat anything for 2 days. Pt reports she has attempted to eat small things here and there however the pain is unbearable. Denies fever or chills. Denies vomiting but reports nausea. Denies bloody stool.

## 2017-06-21 NOTE — Discharge Instructions (Signed)
Please continue medicines prescribed to you Start Pepcid for abdominal pain twice a day Make an appointment with GI

## 2017-06-21 NOTE — ED Notes (Signed)
Pt verbalized understanding of all d/c instructions, prescriptions, and f/u information. All belongings with patient at this time.

## 2017-06-21 NOTE — ED Triage Notes (Signed)
Pt. Stated, I was in the  ER for crohn disease, went twice . The pain is still there .I was given medication given 2 antibiotics and Percocet and Phnergan and Im no better. Ai wanted to be admitted but they said I would be better at home.

## 2017-06-21 NOTE — ED Provider Notes (Signed)
[facility] EMERGENCY DEPARTMENT Provider Note   CSN: [id number] Arrival date & time: 06/21/17  1326   History   Chief Complaint Chief Complaint  Patient presents with  . Abdominal Pain  . Crohn's Disease    HPI [name] is a 27 y.o. female who presents with abdominal pain. PMH significant for Crohn's disease (questionable), Type 1 DM, fibromyalgia, gastroparesis, HTN, chronic tachycardia, frequent ED visits. Followed by Dr. Loreta Ave with GI. This is her 5th ED visit this month and 4th in the past week. She reports abdominal pain for the past 4-5 days. She's been taking Reglan for gastroparesis with no relief. CT on 3/22 was remarkable for possible colitis and she was discharged with antibiotics and pain medicine. She represented yesterday for abdominal pain, nausea, diarrhea. She was discharged with narcotics and phenergan. The phenergan helps with her nausea but the percocet has not been helping. She has not been taking anything for reflux. She is back today because pain is not better. Every time she eats her pain acutely worsens. She states she feels like she is unable to manage her symptoms at home. She denies fever, chills, vomiting, bloody diarrhea, urinary symptoms, vaginal discharge. Past surgical hx significant for cholecystectomy. Flex sig in Dec did not show overt inflammation or ulcerations.  HPI  Past Medical History:  Diagnosis Date  . Anxiety   . Crohn's disease (HCC)   . Diabetes type 1, uncontrolled (HCC) 11/14/2011   Since age 56  . Fibromyalgia   . Gastroparesis   . Hypertension   . Infection    UTI April 2016    Patient Active Problem List   Diagnosis Date Noted  . Intractable nausea and vomiting 04/11/2017  . Nausea and vomiting 04/10/2017  . Essential hypertension   . Colitis   . Crohn's disease (HCC) 04/03/2017  . Abscess and cellulitis of gluteal region 03/24/2017  . Cellulitis and abscess of buttock 03/23/2017  . Malnutrition of  moderate degree 03/19/2017  . Anasarca 03/16/2017  . 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 Diarrhea 02/27/2017  . Lower GI bleed 02/27/2017  . DKA (diabetic ketoacidosis) (HCC) 02/26/2017  . Acute metabolic encephalopathy   . DKA, type 1 (HCC) 06/11/2013  . Orthostatic hypotension dysautonomic syndrome (HCC) 04/28/2013  . Body aches 04/28/2013  . Orthostatic hypotension 04/28/2013  . Diabetes mellitus type 1 (HCC) 04/28/2013  . Protein-calorie malnutrition, severe (HCC) 04/28/2013  . Noncompliance with diabetes treatment 09/01/2012  . Dyspnea 02/28/2012  . Sinus tachycardia 02/28/2012  . Hyperglycemia without ketosis 02/28/2012  . Chest pain 12/04/2011  . Diabetes type 1, uncontrolled (HCC) 11/14/2011  . Hypertension associated with diabetes (HCC) 11/14/2011  . Gastroparesis 11/14/2011  . Hyponatremia 09/19/2011  . Abdominal pain 09/18/2011  . Metabolic alkalosis 09/18/2011  . Diabetic hyperosmolar non-ketotic state (HCC) 09/18/2011  . DIAB W/UNSPEC COMP TYPE I [JUV TYPE] UNCNTRL 06/04/2010  . CONSTIPATION 01/01/2010  . RECTAL PAIN 01/01/2010    Past Surgical History:  Procedure Laterality Date  . ANKLE SURGERY    . CHOLECYSTECTOMY  11/15/2011   Procedure: LAPAROSCOPIC CHOLECYSTECTOMY WITH INTRAOPERATIVE CHOLANGIOGRAM;  Surgeon: [name], MD;  Location: [facility];  Service: General;  Laterality: N/A;  . ESOPHAGOGASTRODUODENOSCOPY  12/03/2011   Procedure: ESOPHAGOGASTRODUODENOSCOPY (EGD);  Surgeon: Theda Belfast, MD;  Location: [facility];  Service: Endoscopy;  Laterality: N/A;  . FLEXIBLE SIGMOIDOSCOPY N/A 03/10/2017   Procedure: FLEXIBLE SIGMOIDOSCOPY;  Surgeon: Jeani Hawking, MD;  Location: [facility];  Service: Endoscopy;  Laterality: N/A;  . INCISION AND DRAINAGE PERIRECTAL ABSCESS N/A 03/01/2017   Procedure: IRRIGATION AND DEBRIDEMENT PERIRECTAL ABSCESS;  Surgeon: Ovidio Kin, MD;  Location: [facility];  Service: General;   Laterality: N/A;  . IRRIGATION AND DEBRIDEMENT BUTTOCKS N/A 03/23/2017   Procedure: IRRIGATION AND DEBRIDEMENT BUTTOCKS, SETON PLACEMENT;  Surgeon: Romie Levee, MD;  Location: [facility];  Service: General;  Laterality: N/A;  . LAPAROSCOPY  11/23/2011   Procedure: LAPAROSCOPY DIAGNOSTIC;  Surgeon: Mariella Saa, MD;  Location: [facility];  Service: General;  Laterality: N/A;     OB History    Gravida  1   Para  1   Term      Preterm  1   AB      Living  1     SAB      TAB      Ectopic      Multiple  0   Live Births  1            Home Medications    Prior to Admission medications   Medication Sig Start Date End Date Taking? Authorizing Provider  acetaminophen (TYLENOL) 500 MG tablet Take 1,000 mg by mouth daily as needed for moderate pain.     [provider]  alum & mag hydroxide-simeth (MAALOX ADVANCED MAX ST) 400-400-40 MG/5ML suspension Take 10 mLs by mouth every 6 (six) hours as needed for indigestion. 05/31/17   Cardama, Amadeo Garnet, MD  cholestyramine light (PREVALITE) 4 g packet Take 1 packet (4 g total) by mouth 3 (three) times daily. Patient taking differently: Take 4 g by mouth 2 (two) times daily as needed (diarrhea).  03/14/17   Rolly Salter, MD  ciprofloxacin (CIPRO) 500 MG tablet Take 1 tablet (500 mg total) by mouth 2 (two) times daily. 06/19/17   Terrilee Files, MD  citalopram (CELEXA) 20 MG tablet Take 1 tablet (20 mg total) by mouth daily. 05/13/17   Henson, Vickie L, NP-C  cyclobenzaprine (FLEXERIL) 10 MG tablet Take 1 tablet (10 mg total) by mouth 2 (two) times daily as needed for muscle spasms. 05/21/17   Khatri, Hina, PA-C  diphenoxylate-atropine (LOMOTIL) 2.5-0.025 MG tablet Take 2 tablets by mouth 4 (four) times daily. 05/18/17   Henson, Vickie L, NP-C  EPINEPHrine (EPIPEN 2-PAK) 0.3 mg/0.3 mL IJ SOAJ injection Inject 0.3 mLs (0.3 mg total) into the muscle as needed (allergic reaction). 03/21/17   Glade Lloyd, MD  ferrous sulfate  325 (65 FE) MG tablet Take 1 tablet (325 mg total) by mouth 2 (two) times daily with a meal. 05/13/17   Henson, Vickie L, NP-C  folic acid (FOLVITE) 1 MG tablet Take 1 tablet (1 mg total) by mouth daily. 05/13/17   Henson, Vickie L, NP-C  furosemide (LASIX) 40 MG tablet Take 1 tablet (40 mg total) by mouth daily. 05/13/17 06/20/17  Henson, Vickie L, NP-C  gabapentin (NEURONTIN) 300 MG capsule Take 1 capsule (300 mg total) by mouth 2 (two) times daily. 04/03/17   Henson, Vickie L, NP-C  glucose 4 GM chewable tablet Chew 1 tablet (4 g total) by mouth as needed for low blood sugar. 08/10/14   Orvilla Cornwall A, CNM  insulin lispro (HUMALOG) 100 UNIT/ML injection Inject 0.02 mLs (2 Units total) into the skin 3 (three) times daily with meals. Patient taking differently: Inject 0-9 Units into the skin 3 (three) times daily with meals. Sliding scale 04/09/17   Romero Belling, MD  LANTUS 100 UNIT/ML injection  Inject 0.18 mLs (18 Units total) into the skin every morning. 05/07/17   Romero Belling, MD  lidocaine (LIDODERM) 5 % Place 1 patch onto the skin daily. Remove & Discard patch within 12 hours or as directed by MD Patient taking differently: Place 1 patch onto the skin daily as needed (pain). Remove & Discard patch within 12 hours or as directed by MD 05/21/17   Dietrich Pates, PA-C  lidocaine (XYLOCAINE) 2 % solution Use as directed 15 mLs in the mouth or throat every 6 (six) hours as needed (stomach pain). 05/31/17   Nira Conn, MD  lipase/protease/amylase (CREON) 36000 UNITS CPEP capsule Take 1 capsule (36,000 Units total) by mouth 3 (three) times daily with meals. 03/14/17   Rolly Salter, MD  loperamide (IMODIUM) 2 MG capsule Take 1 capsule (2 mg total) by mouth 3 (three) times daily before meals. DO NOT TAKE IT FOR THE DAY YOU DONT HAVE A BOWEL MOVEMENT. 04/03/17   Henson, Vickie L, NP-C  metoCLOPramide (REGLAN) 5 MG tablet Take 5 mg by mouth every 6 (six) hours as needed for nausea or vomiting.      [provider]  metroNIDAZOLE (FLAGYL) 500 MG tablet Take 1 tablet (500 mg total) by mouth 2 (two) times daily. 06/19/17   Terrilee Files, MD  oxyCODONE-acetaminophen (PERCOCET/ROXICET) 5-325 MG tablet Take 1 tablet by mouth every 6 (six) hours as needed for severe pain. 06/20/17   Petrucelli, Pleas Koch, PA-C  promethazine (PHENERGAN) 25 MG tablet Take 1 tablet (25 mg total) by mouth every 6 (six) hours as needed for nausea or vomiting. 06/20/17   Petrucelli, Samantha R, PA-C  sucralfate (CARAFATE) 1 GM/10ML suspension Take 10 mLs (1 g total) by mouth 4 (four) times daily -  with meals and at bedtime. 06/17/17   Elpidio Anis, PA-C    Family History Family History  Problem Relation Age of Onset  . Diabetes Mother   . Hypertension Father     Social History Social History   Tobacco Use  . Smoking status: Never Smoker  . Smokeless tobacco: Never Used  Substance Use Topics  . Alcohol use: No    Alcohol/week: 0.0 oz    Frequency: Never  . Drug use: No     Allergies   Peanut-containing drug products and Lactose intolerance (gi)   Review of Systems Review of Systems  Constitutional: Negative for activity change, appetite change and fever.  Respiratory: Negative for shortness of breath.   Cardiovascular: Negative for chest pain.  Gastrointestinal: Positive for abdominal pain, diarrhea and nausea. Negative for blood in stool and vomiting.  Genitourinary: Negative for dysuria, vaginal bleeding and vaginal discharge.  All other systems reviewed and are negative.    Physical Exam Updated Vital Signs BP (!) 153/100   Pulse (!) 111   Temp 98 F (36.7 C) (Oral)   Resp 15   Ht 5\' 7"  (1.702 m)   Wt 59 kg (130 lb)   SpO2 98%   BMI 20.36 kg/m   Physical Exam  Constitutional: She is oriented to person, place, and time. She appears well-developed and well-nourished. No distress.  Calm. Uncomfortable appearing  HENT:  Head: Normocephalic and atraumatic.  Eyes: Pupils  are equal, round, and reactive to light. Conjunctivae are normal. Right eye exhibits no discharge. Left eye exhibits no discharge. No scleral icterus.  Neck: Normal range of motion.  Cardiovascular: Tachycardia present. Exam reveals no gallop and no friction rub.  No murmur heard. Pulmonary/Chest: Effort normal  and breath sounds normal. No stridor. No respiratory distress. She has no wheezes. She has no rales. She exhibits no tenderness.  Abdominal: Soft. Bowel sounds are normal. She exhibits no distension and no mass. There is tenderness (diffuse). There is no rebound and no guarding. No hernia.  Cholecystectomy scar  Neurological: She is alert and oriented to person, place, and time.  Skin: Skin is warm and dry.  Psychiatric: Her behavior is normal. She exhibits a depressed mood.  Nursing note and vitals reviewed.    ED Treatments / Results  Labs (all labs ordered are listed, but only abnormal results are displayed) Labs Reviewed  COMPREHENSIVE METABOLIC PANEL - Abnormal; Notable for the following components:      Result Value   Potassium 3.3 (*)    Chloride 96 (*)    Glucose, Bld 169 (*)    BUN <5 (*)    Albumin 3.3 (*)    All other components within normal limits  CBC - Abnormal; Notable for the following components:   WBC 3.9 (*)    RBC 3.81 (*)    Hemoglobin 10.9 (*)    HCT 31.1 (*)    All other components within normal limits  LIPASE, BLOOD  URINALYSIS, ROUTINE W REFLEX MICROSCOPIC  I-STAT BETA HCG BLOOD, ED ([facility], [facility], [facility] ONLY)    EKG None  Radiology No results found.  Procedures Procedures (including critical care time)  Medications Ordered in ED Medications - No data to display   Initial Impression / Assessment and Plan / ED Course  I have reviewed the triage vital signs and the nursing notes.  Pertinent labs & imaging results that were available during my care of the patient were reviewed by me and considered in my medical decision making (see chart for  details).  27 year old female presents with persistent abdominal pain, nausea, diarrhea, decreased PO intake. Unclear if she actually has Crohn's disease or not. Diagnosis appears questionable. She is tachycardic which is her baseline. She is hypertensive. On exam, she has generalized abdominal tenderness. She reports the pain is the same as the last several days, just worse. Pain is worse with eating, better with Phenergan and Percocet. CBC is remarkable for anemia which is at baseline. CMP is remarkable for mild hypokalemia, low albumin. Lipase is normal. UA is pending. Will give fluids, pepcid, GI cocktail. Question gastritis vs PUD.  Discussed with Dr. Deretha Emory.  Since labs are essentially the same and pain is similar, will provide pain control and have her follow up with GI.   Final Clinical Impressions(s) / ED Diagnoses   Final diagnoses:  Generalized abdominal pain  Nausea    ED Discharge Orders    None       Bethel Born, PA-C 06/21/17 2339    Vanetta Mulders, MD 06/25/17 [phone]

## 2017-06-21 NOTE — ED Notes (Signed)
ED Provider at bedside. 

## 2017-06-21 NOTE — ED Notes (Signed)
Pt verbalized understanding regarding need for urine specimen. Pt does not need to void at this time.  

## 2017-06-23 ENCOUNTER — Encounter (HOSPITAL_COMMUNITY): Payer: Self-pay | Admitting: Nurse Practitioner

## 2017-06-23 ENCOUNTER — Other Ambulatory Visit: Payer: Self-pay

## 2017-06-23 ENCOUNTER — Inpatient Hospital Stay (HOSPITAL_COMMUNITY)
Admission: EM | Admit: 2017-06-23 | Discharge: 2017-06-27 | DRG: 074 | Disposition: A | Discharge status: Home / Self Care | Payer: 59 | Attending: Internal Medicine | Admitting: Internal Medicine

## 2017-06-23 DIAGNOSIS — R1012 Left upper quadrant pain: Secondary | ICD-10-CM

## 2017-06-23 DIAGNOSIS — E876 Hypokalemia: Secondary | ICD-10-CM | POA: Diagnosis present

## 2017-06-23 DIAGNOSIS — K529 Noninfective gastroenteritis and colitis, unspecified: Secondary | ICD-10-CM | POA: Diagnosis present

## 2017-06-23 DIAGNOSIS — R110 Nausea: Secondary | ICD-10-CM | POA: Diagnosis not present

## 2017-06-23 DIAGNOSIS — K3184 Gastroparesis: Secondary | ICD-10-CM | POA: Diagnosis not present

## 2017-06-23 DIAGNOSIS — M797 Fibromyalgia: Secondary | ICD-10-CM | POA: Diagnosis present

## 2017-06-23 DIAGNOSIS — D649 Anemia, unspecified: Secondary | ICD-10-CM | POA: Diagnosis present

## 2017-06-23 DIAGNOSIS — R112 Nausea with vomiting, unspecified: Secondary | ICD-10-CM | POA: Diagnosis present

## 2017-06-23 DIAGNOSIS — Z91018 Allergy to other foods: Secondary | ICD-10-CM

## 2017-06-23 DIAGNOSIS — E109 Type 1 diabetes mellitus without complications: Secondary | ICD-10-CM | POA: Diagnosis present

## 2017-06-23 DIAGNOSIS — K219 Gastro-esophageal reflux disease without esophagitis: Secondary | ICD-10-CM | POA: Diagnosis present

## 2017-06-23 DIAGNOSIS — Z91011 Allergy to milk products: Secondary | ICD-10-CM

## 2017-06-23 DIAGNOSIS — Z833 Family history of diabetes mellitus: Secondary | ICD-10-CM

## 2017-06-23 DIAGNOSIS — I10 Essential (primary) hypertension: Secondary | ICD-10-CM | POA: Diagnosis present

## 2017-06-23 DIAGNOSIS — R197 Diarrhea, unspecified: Secondary | ICD-10-CM | POA: Diagnosis not present

## 2017-06-23 DIAGNOSIS — E1043 Type 1 diabetes mellitus with diabetic autonomic (poly)neuropathy: Secondary | ICD-10-CM | POA: Diagnosis not present

## 2017-06-23 DIAGNOSIS — F418 Other specified anxiety disorders: Secondary | ICD-10-CM | POA: Diagnosis present

## 2017-06-23 DIAGNOSIS — R6510 Systemic inflammatory response syndrome (SIRS) of non-infectious origin without acute organ dysfunction: Secondary | ICD-10-CM | POA: Diagnosis not present

## 2017-06-23 DIAGNOSIS — G43A0 Cyclical vomiting, not intractable: Secondary | ICD-10-CM | POA: Diagnosis not present

## 2017-06-23 DIAGNOSIS — G8929 Other chronic pain: Secondary | ICD-10-CM | POA: Diagnosis present

## 2017-06-23 DIAGNOSIS — Z794 Long term (current) use of insulin: Secondary | ICD-10-CM

## 2017-06-23 LAB — URINALYSIS, ROUTINE W REFLEX MICROSCOPIC
Bacteria, UA: NONE SEEN
Bilirubin Urine: NEGATIVE
Glucose, UA: 50 mg/dL — AB
Ketones, ur: 20 mg/dL — AB
Nitrite: NEGATIVE
Protein, ur: 30 mg/dL — AB
Specific Gravity, Urine: 1.012 (ref 1.005–1.030)
pH: 6 (ref 5.0–8.0)

## 2017-06-23 LAB — CBC
HCT: 31.6 % — ABNORMAL LOW (ref 36.0–46.0)
Hemoglobin: 11.1 g/dL — ABNORMAL LOW (ref 12.0–15.0)
MCH: 28.3 pg (ref 26.0–34.0)
MCHC: 35.1 g/dL (ref 30.0–36.0)
MCV: 80.6 fL (ref 78.0–100.0)
Platelets: 192 10*3/uL (ref 150–400)
RBC: 3.92 MIL/uL (ref 3.87–5.11)
RDW: 12.7 % (ref 11.5–15.5)
WBC: 3.7 10*3/uL — ABNORMAL LOW (ref 4.0–10.5)

## 2017-06-23 LAB — I-STAT BETA HCG BLOOD, ED (MC, WL, AP ONLY): I-stat hCG, quantitative: <5 m[IU]/mL (ref <5)

## 2017-06-23 LAB — LIPASE, BLOOD: Lipase: 22 U/L (ref 11–51)

## 2017-06-23 LAB — COMPREHENSIVE METABOLIC PANEL
ALT: 11 U/L — ABNORMAL LOW (ref 14–54)
AST: 16 U/L (ref 15–41)
Albumin: 3.7 g/dL (ref 3.5–5.0)
Alkaline Phosphatase: 81 U/L (ref 38–126)
Anion gap: 12 (ref 5–15)
BUN: <5 mg/dL — ABNORMAL LOW (ref 6–20)
CO2: 29 mmol/L (ref 22–32)
Calcium: 9.1 mg/dL (ref 8.9–10.3)
Chloride: 96 mmol/L — ABNORMAL LOW (ref 101–111)
Creatinine, Ser: 0.56 mg/dL (ref 0.44–1.00)
GFR calc Af Amer: >60 mL/min (ref >60)
GFR calc non Af Amer: >60 mL/min (ref >60)
Glucose, Bld: 286 mg/dL — ABNORMAL HIGH (ref 65–99)
Potassium: 3.3 mmol/L — ABNORMAL LOW (ref 3.5–5.1)
Sodium: 137 mmol/L (ref 135–145)
Total Bilirubin: 0.6 mg/dL (ref 0.3–1.2)
Total Protein: 7.5 g/dL (ref 6.5–8.1)

## 2017-06-23 LAB — GLUCOSE, CAPILLARY: Glucose-Capillary: 166 mg/dL — ABNORMAL HIGH (ref 65–99)

## 2017-06-23 LAB — LACTIC ACID, PLASMA: Lactic Acid, Venous: 0.8 mmol/L (ref 0.5–1.9)

## 2017-06-23 MED ORDER — DICYCLOMINE HCL 10 MG PO CAPS
20.0000 mg | ORAL_CAPSULE | Freq: Once | ORAL | Status: AC
  Administered 2017-06-23: 20 mg via ORAL
  Filled 2017-06-23: qty 2

## 2017-06-23 MED ORDER — ONDANSETRON HCL 4 MG/2ML IJ SOLN
4.0000 mg | Freq: Four times a day (QID) | INTRAMUSCULAR | Status: DC | PRN
  Administered 2017-06-24 – 2017-06-26 (×6): 4 mg via INTRAVENOUS
  Filled 2017-06-23 (×6): qty 2

## 2017-06-23 MED ORDER — LIDOCAINE 5 % EX PTCH
1.0000 | MEDICATED_PATCH | Freq: Every day | CUTANEOUS | Status: DC | PRN
  Filled 2017-06-23: qty 1

## 2017-06-23 MED ORDER — INSULIN GLARGINE 100 UNIT/ML ~~LOC~~ SOLN
18.0000 [IU] | Freq: Every day | SUBCUTANEOUS | Status: DC
  Administered 2017-06-24: 18 [IU] via SUBCUTANEOUS
  Filled 2017-06-23 (×2): qty 0.18

## 2017-06-23 MED ORDER — GABAPENTIN 300 MG PO CAPS
300.0000 mg | ORAL_CAPSULE | Freq: Two times a day (BID) | ORAL | Status: DC
  Administered 2017-06-23 – 2017-06-27 (×8): 300 mg via ORAL
  Filled 2017-06-23 (×9): qty 1

## 2017-06-23 MED ORDER — MORPHINE SULFATE (PF) 2 MG/ML IV SOLN
2.0000 mg | INTRAVENOUS | Status: DC | PRN
Start: 2017-06-23 — End: 2017-06-24
  Administered 2017-06-23 – 2017-06-24 (×5): 2 mg via INTRAVENOUS
  Filled 2017-06-23 (×5): qty 1

## 2017-06-23 MED ORDER — DIPHENOXYLATE-ATROPINE 2.5-0.025 MG PO TABS
2.0000 | ORAL_TABLET | Freq: Four times a day (QID) | ORAL | Status: DC
  Administered 2017-06-23 – 2017-06-27 (×9): 2 via ORAL
  Filled 2017-06-23 (×9): qty 2

## 2017-06-23 MED ORDER — ACETAMINOPHEN 325 MG PO TABS: 650.0000 mg | ORAL_TABLET | Freq: Four times a day (QID) | ORAL | Status: DC | PRN

## 2017-06-23 MED ORDER — PIPERACILLIN-TAZOBACTAM 3.375 G IVPB
3.3750 g | Freq: Three times a day (TID) | INTRAVENOUS | Status: DC
  Administered 2017-06-23 – 2017-06-27 (×11): 3.375 g via INTRAVENOUS
  Filled 2017-06-23 (×11): qty 50

## 2017-06-23 MED ORDER — PIPERACILLIN-TAZOBACTAM 3.375 G IVPB 30 MIN: 3.3750 g | Freq: Three times a day (TID) | INTRAVENOUS | Status: DC

## 2017-06-23 MED ORDER — ONDANSETRON HCL 4 MG PO TABS: 4.0000 mg | ORAL_TABLET | Freq: Four times a day (QID) | ORAL | Status: DC | PRN

## 2017-06-23 MED ORDER — FERROUS SULFATE 325 (65 FE) MG PO TABS
325.0000 mg | ORAL_TABLET | Freq: Two times a day (BID) | ORAL | Status: DC
  Administered 2017-06-24 – 2017-06-27 (×3): 325 mg via ORAL
  Filled 2017-06-23 (×6): qty 1

## 2017-06-23 MED ORDER — CYCLOBENZAPRINE HCL 10 MG PO TABS: 10.0000 mg | ORAL_TABLET | Freq: Two times a day (BID) | ORAL | Status: DC | PRN

## 2017-06-23 MED ORDER — CHOLESTYRAMINE LIGHT 4 G PO PACK
4.0000 g | PACK | Freq: Two times a day (BID) | ORAL | Status: DC | PRN
  Filled 2017-06-23: qty 1

## 2017-06-23 MED ORDER — SODIUM CHLORIDE 0.9 % IV BOLUS
1000.0000 mL | Freq: Once | INTRAVENOUS | Status: AC
Start: 2017-06-23 — End: 2017-06-23
  Administered 2017-06-23: 1000 mL via INTRAVENOUS

## 2017-06-23 MED ORDER — POTASSIUM CHLORIDE 10 MEQ/100ML IV SOLN
10.0000 meq | INTRAVENOUS | Status: AC
  Administered 2017-06-23 – 2017-06-24 (×3): 10 meq via INTRAVENOUS
  Filled 2017-06-23 (×3): qty 100

## 2017-06-23 MED ORDER — FAMOTIDINE 20 MG PO TABS
40.0000 mg | ORAL_TABLET | Freq: Two times a day (BID) | ORAL | Status: DC
  Administered 2017-06-23: 40 mg via ORAL
  Filled 2017-06-23 (×2): qty 2

## 2017-06-23 MED ORDER — CITALOPRAM HYDROBROMIDE 20 MG PO TABS
20.0000 mg | ORAL_TABLET | Freq: Every day | ORAL | Status: DC
  Administered 2017-06-24 – 2017-06-27 (×4): 20 mg via ORAL
  Filled 2017-06-23 (×5): qty 1

## 2017-06-23 MED ORDER — ACETAMINOPHEN 650 MG RE SUPP: 650.0000 mg | Freq: Four times a day (QID) | RECTAL | Status: DC | PRN

## 2017-06-23 MED ORDER — FOLIC ACID 1 MG PO TABS
1.0000 mg | ORAL_TABLET | Freq: Every day | ORAL | Status: DC
  Administered 2017-06-24 – 2017-06-27 (×4): 1 mg via ORAL
  Filled 2017-06-23 (×4): qty 1

## 2017-06-23 MED ORDER — LIDOCAINE VISCOUS 2 % MT SOLN
15.0000 mL | Freq: Four times a day (QID) | OROMUCOSAL | Status: DC | PRN
  Filled 2017-06-23: qty 15

## 2017-06-23 MED ORDER — CIPROFLOXACIN IN D5W 400 MG/200ML IV SOLN: 400.0000 mg | Freq: Two times a day (BID) | INTRAVENOUS | Status: DC

## 2017-06-23 MED ORDER — METRONIDAZOLE IN NACL 5-0.79 MG/ML-% IV SOLN: 500.0000 mg | Freq: Three times a day (TID) | INTRAVENOUS | Status: DC

## 2017-06-23 MED ORDER — INSULIN ASPART 100 UNIT/ML ~~LOC~~ SOLN
0.0000 [IU] | SUBCUTANEOUS | Status: DC
  Administered 2017-06-24: 1 [IU] via SUBCUTANEOUS
  Administered 2017-06-24 (×2): 2 [IU] via SUBCUTANEOUS
  Administered 2017-06-24: 1 [IU] via SUBCUTANEOUS
  Administered 2017-06-25: 2 [IU] via SUBCUTANEOUS
  Administered 2017-06-25: 5 [IU] via SUBCUTANEOUS
  Administered 2017-06-25: 2 [IU] via SUBCUTANEOUS
  Administered 2017-06-26 (×2): 1 [IU] via SUBCUTANEOUS
  Administered 2017-06-26 – 2017-06-27 (×3): 3 [IU] via SUBCUTANEOUS
  Administered 2017-06-27: 1 [IU] via SUBCUTANEOUS

## 2017-06-23 MED ORDER — SUCRALFATE 1 GM/10ML PO SUSP
1.0000 g | Freq: Three times a day (TID) | ORAL | Status: DC
  Administered 2017-06-23 – 2017-06-27 (×13): 1 g via ORAL
  Filled 2017-06-23 (×14): qty 10

## 2017-06-23 MED ORDER — MORPHINE SULFATE (PF) 4 MG/ML IV SOLN
4.0000 mg | Freq: Once | INTRAVENOUS | Status: AC
Start: 2017-06-23 — End: 2017-06-23
  Administered 2017-06-23: 4 mg via INTRAVENOUS
  Filled 2017-06-23: qty 1

## 2017-06-23 MED ORDER — PANCRELIPASE (LIP-PROT-AMYL) 12000-38000 UNITS PO CPEP
36000.0000 [IU] | ORAL_CAPSULE | Freq: Three times a day (TID) | ORAL | Status: DC
  Administered 2017-06-24 – 2017-06-27 (×7): 36000 [IU] via ORAL
  Filled 2017-06-23 (×4): qty 3
  Filled 2017-06-23: qty 1
  Filled 2017-06-23 (×4): qty 3

## 2017-06-23 MED ORDER — SODIUM CHLORIDE 0.9 % IV SOLN
INTRAVENOUS | Status: DC
  Administered 2017-06-23: 23:00:00 via INTRAVENOUS

## 2017-06-23 MED ORDER — ENOXAPARIN SODIUM 40 MG/0.4ML ~~LOC~~ SOLN
40.0000 mg | SUBCUTANEOUS | Status: DC
  Administered 2017-06-23 – 2017-06-25 (×3): 40 mg via SUBCUTANEOUS
  Filled 2017-06-23 (×4): qty 0.4

## 2017-06-23 MED ORDER — HYDRALAZINE HCL 20 MG/ML IJ SOLN
10.0000 mg | INTRAMUSCULAR | Status: DC | PRN
  Administered 2017-06-23: 10 mg via INTRAVENOUS
  Filled 2017-06-23: qty 1

## 2017-06-23 NOTE — Progress Notes (Signed)
Pt gave permission to ask questions on nursing admission history with pt's husband in the room. Briscoe Burns BSN, RN-BC Admissions RN 06/23/2017 8:42 PM

## 2017-06-23 NOTE — ED Notes (Addendum)
ED TO INPATIENT HANDOFF REPORT  Name/Age/Gender [name] 27 y.o. female  Code Status    Code Status Orders  (From admission, onward)        Start     Ordered   06/23/17 2014  Full code  Continuous     06/23/17 2017    Code Status History    Date Active Date Inactive Code Status Order ID Comments User Context   04/10/2017 1406 04/16/2017 1816 Full Code [id number]  Phillips Grout, MD Inpatient   03/23/2017 1316 03/24/2017 1854 Full Code [id number]  Phillips Grout, MD Inpatient   03/17/2017 0050 03/21/2017 2352 Full Code [id number]  Rise Patience, MD ED   02/26/2017 2259 03/14/2017 2221 Full Code [id number]  Karmen Bongo, MD Inpatient   02/26/2017 2012 02/26/2017 2259 Full Code [id number]  Aldean Jewett, MD ED   01/26/2015 0513 01/29/2015 1812 Full Code [id number]  Morene Crocker, CNM Inpatient   01/24/2015 1021 01/26/2015 0513 Full Code [id number]  Mcneil Sober, RN Inpatient   01/16/2015 1930 01/24/2015 1021 Full Code [id number]  Gwen Pounds, [place] Inpatient   06/11/2013 0049 06/12/2013 1340 Full Code [id number]  Theressa Millard, MD ED   04/28/2013 0319 04/29/2013 1814 Full Code [id number]  Rise Patience, MD Inpatient   09/01/2012 0749 09/02/2012 1159 Full Code [id number]  Derrill Kay, MD Inpatient   03/15/2012 2346 03/16/2012 1646 Full Code [id number]  Rueben Bash, RN Inpatient   02/28/2012 1657 03/01/2012 1921 Full Code [id number]  Tamsen Roers, RN Inpatient   11/23/2011 0336 12/06/2011 2004 Full Code [id number]  Aldean Ast, RN Inpatient   09/18/2011 1007 09/20/2011 2050 Full Code [id number]  Dionne Milo, NP Inpatient      Home/SNF/Other Home  Chief Complaint Abd/Back Pain  Level of Care/Admitting Diagnosis ED Disposition    ED Disposition Condition [facility] Area: [facility] [[id number]]  Level of Care: Med-Surg [16]  Diagnosis: Colitis [833383]  Admitting Physician: Norval Morton [[id number]]  Attending Physician: Norval Morton [[id number]]  PT Class (Do Not Modify): Observation [104]  PT Acc Code (Do Not Modify): Observation [10022]       Medical History Past Medical History:  Diagnosis Date  . Anxiety   . Crohn's disease ([place])   . Diabetes type 1, uncontrolled ([place]) 11/14/2011   Since age 89  . Fibromyalgia   . Gastroparesis   . Hypertension   . Infection    UTI April 2016    Allergies Allergies  Allergen Reactions  . Other Anaphylaxis    Reaction to [place] nuts   . Lactose Intolerance (Gi) Diarrhea    IV Location/Drains/Wounds Patient Lines/Drains/Airways Status   Active Line/Drains/Airways    Name:   Placement date:   Placement time:   Site:   Days:   Peripheral IV 06/23/17 Left Antecubital   06/23/17    1746    Antecubital   less than 1          Labs/Imaging Results for orders placed or performed during the hospital encounter of 06/23/17 (from the past 48 hour(s))  Lipase, blood     Status: None   Collection Time: 06/23/17 12:25 PM  Result Value Ref Range   Lipase 22 11 - 51 U/L    Comment: Performed at [facility], [place] [address] [place], [place] [address]  Comprehensive metabolic panel     Status: Abnormal   Collection Time: 06/23/17  12:25 PM  Result Value Ref Range   Sodium 137 135 - 145 mmol/L   Potassium 3.3 (L) 3.5 - 5.1 mmol/L   Chloride 96 (L) 101 - 111 mmol/L   CO2 29 22 - 32 mmol/L   Glucose, Bld 286 (H) 65 - 99 mg/dL   BUN <5 (L) 6 - 20 mg/dL   Creatinine, Ser 0.56 0.44 - 1.00 mg/dL   Calcium 9.1 8.9 - 10.3 mg/dL   Total Protein 7.5 6.5 - 8.1 g/dL   Albumin 3.7 3.5 - 5.0 g/dL   AST 16 15 - 41 U/L   ALT 11 (L) 14 - 54 U/L   Alkaline Phosphatase 81 38 - 126 U/L   Total Bilirubin 0.6 0.3 - 1.2 mg/dL   GFR calc non Af Amer >60 >60 mL/min   GFR calc Af Amer >60 >60 mL/min    Comment: (NOTE) The eGFR has been calculated using the CKD EPI equation. This calculation has not been validated in all clinical  situations. eGFR's persistently <60 mL/min signify possible Chronic Kidney Disease.    Anion gap 12 5 - 15    Comment: Performed at [facility], [place] [address] [place], [place] [address]  CBC     Status: Abnormal   Collection Time: 06/23/17 12:25 PM  Result Value Ref Range   WBC 3.7 (L) 4.0 - 10.5 K/uL   RBC 3.92 3.87 - 5.11 MIL/uL   Hemoglobin 11.1 (L) 12.0 - 15.0 g/dL   HCT 31.6 (L) 36.0 - 46.0 %   MCV 80.6 78.0 - 100.0 fL   MCH 28.3 26.0 - 34.0 pg   MCHC 35.1 30.0 - 36.0 g/dL   RDW 12.7 11.5 - 15.5 %   Platelets 192 150 - 400 K/uL    Comment: Performed at [facility], [place] [address], [place], [place] [address]  I-Stat beta hCG blood, ED     Status: None   Collection Time: 06/23/17 12:35 PM  Result Value Ref Range   I-stat hCG, quantitative <5.0 <5 mIU/mL   Comment 3            Comment:   GEST. AGE      CONC.  (mIU/mL)   <=1 WEEK        5 - 50     2 WEEKS       50 - 500     3 WEEKS       100 - 10,000     4 WEEKS     1,000 - 30,000        FEMALE AND NON-PREGNANT FEMALE:     LESS THAN 5 mIU/mL   Urinalysis, Routine w reflex microscopic     Status: Abnormal   Collection Time: 06/23/17  5:21 PM  Result Value Ref Range   Color, Urine YELLOW YELLOW   APPearance CLEAR CLEAR   Specific Gravity, Urine 1.012 1.005 - 1.030   pH 6.0 5.0 - 8.0   Glucose, UA 50 (A) NEGATIVE mg/dL   Hgb urine dipstick MODERATE (A) NEGATIVE   Bilirubin Urine NEGATIVE NEGATIVE   Ketones, ur 20 (A) NEGATIVE mg/dL   Protein, ur 30 (A) NEGATIVE mg/dL   Nitrite NEGATIVE NEGATIVE   Leukocytes, UA TRACE (A) NEGATIVE   RBC / HPF 0-5 0 - 5 RBC/hpf   WBC, UA 0-5 0 - 5 WBC/hpf   Bacteria, UA NONE SEEN NONE SEEN   Squamous Epithelial / LPF 0-5 (A) NONE SEEN   Mucus  PRESENT    Hyaline Casts, UA PRESENT     Comment: Performed at [facility], [place] [address] [place], [place] [address]   No results found.  Pending Labs Unresulted Labs (From  admission, onward)   Start     Ordered   06/24/17 0500  CBC  Tomorrow morning,   R     06/23/17 2017   06/24/17 3300  Basic metabolic panel  Tomorrow morning,   R     06/23/17 2017   06/23/17 2027  C difficile quick scan w PCR reflex  (C Difficile quick screen w PCR reflex panel)  Once, for 24 hours,   R     06/23/17 2026   06/23/17 2006  Lactic acid, plasma  Add-on,   R     06/23/17 2005      Vitals/Pain Today's Vitals   06/23/17 1106 06/23/17 1717 06/23/17 1900  BP: (!) 128/104 (!) 181/120 (!) 148/98  Pulse: (!) 109 (!) 114 (!) 107  Resp: 18 17   Temp: 98.9 F (37.2 C)    TempSrc: Oral    SpO2: 99% 100% 100%  Weight: 130 lb (59 kg)    Height: _0  (1.702 m)    PainSc: 8       Isolation Precautions Enteric precautions (UV disinfection)  Medications Medications  diphenoxylate-atropine (LOMOTIL) 2.5-0.025 MG per tablet 2 tablet (has no administration in time range)  famotidine (PEPCID) tablet 40 mg (has no administration in time range)  ferrous sulfate tablet 325 mg (has no administration in time range)  folic acid (FOLVITE) tablet 1 mg (has no administration in time range)  insulin glargine (LANTUS) injection 18 Units (has no administration in time range)  lidocaine (LIDODERM) 5 % 1 patch (has no administration in time range)  lidocaine (XYLOCAINE) 2 % viscous mouth solution 15 mL (has no administration in time range)  lipase/protease/amylase (CREON) capsule 36,000 Units (has no administration in time range)  sucralfate (CARAFATE) 1 GM/10ML suspension 1 g (has no administration in time range)  gabapentin (NEURONTIN) capsule 300 mg (has no administration in time range)  enoxaparin (LOVENOX) injection 40 mg (has no administration in time range)  potassium chloride 10 mEq in 100 mL IVPB (has no administration in time range)  0.9 %  sodium chloride infusion (has no administration in time range)  acetaminophen (TYLENOL) tablet 650 mg (has no administration in time range)     Or  acetaminophen (TYLENOL) suppository 650 mg (has no administration in time range)  ondansetron (ZOFRAN) tablet 4 mg (has no administration in time range)    Or  ondansetron (ZOFRAN) injection 4 mg (has no administration in time range)  hydrALAZINE (APRESOLINE) injection 10 mg (has no administration in time range)  insulin aspart (novoLOG) injection 0-9 Units (has no administration in time range)  piperacillin-tazobactam (ZOSYN) IVPB 3.375 g (has no administration in time range)  morphine 2 MG/ML injection 2 mg (has no administration in time range)  cholestyramine light (PREVALITE) packet 4 g (has no administration in time range)  citalopram (CELEXA) tablet 20 mg (has no administration in time range)  cyclobenzaprine (FLEXERIL) tablet 10 mg (has no administration in time range)  dicyclomine (BENTYL) capsule 20 mg (20 mg Oral Given 06/23/17 1726)  sodium chloride 0.9 % bolus 1,000 mL (0 mLs Intravenous Stopped 06/23/17 1854)  morphine 4 MG/ML injection 4 mg (4 mg Intravenous Given 06/23/17 1822)    Mobility walks

## 2017-06-23 NOTE — H&P (Signed)
History and Physical    [name] ZOX:[id number] DOB: 07-May-1990 DOA: 06/23/2017  Referring MD/NP/PA: Harolyn Rutherford, PA-C PCP: Avanell Shackleton, NP-C  Patient coming from: Home  Chief Complaint: Abdominal pain  I have personally briefly reviewed patient's old medical records in [facility]   HPI: [name] is a 27 y.o. female with medical history significant of HTN, DM type 1, gastroparesis, and chron's dz previously followed by Dr. Loreta Ave; who presents with complaints of lower abdominal pain.  Patient reports symptoms initially started 5 days ago with crampy epigastric and left lower quadrant abdominal pain.  Following days symptoms became more severe and sharp with patient reporting having 6-7 episodes of nonbloody diarrhea.  She was seen in the emergency department and noted to have signs of mild acute colitis on CT scan of the abdomen.  At that time patient reports being given ciprofloxacin and Flagyl which she is taken over the last 4 days.  Despite taking medication reports having continued abdominal pain unable to keep any significant amount of food or liquids down.  Other medications for pain have provided minimal relief.  Notes the symptoms are different from issues with gastroparesis.  Reports having 1-2 episodes of nausea and vomiting.  Denies any fever, chills, blood in stool/urine, or dysuria.  Patient notes that she had just completed a course of antibiotics for perirectal abscess earlier this month.  She was being followed by Dr. Loreta Ave of gastroenterolog, but currently is in the process of switching to [facility].  ED Course: Upon admission into the emergency department patient was noted to be afebrile, pulse 109-114, respirations 17-18, blood pressure 128/104-181/120, and O2 saturations maintained on room air.  Labs revealed WBC 3.7, hemoglobin 11.1, potassium 3.3, glucose 286.  Patient was given Bentyl, 4 mg of morphine, and 1 L of normal saline IV fluid. TRH called to  admit.  Review of Systems  Constitutional: Positive for malaise/fatigue. Negative for chills and fever.  HENT: Negative for ear pain and nosebleeds.   Eyes: Negative for photophobia and discharge.  Respiratory: Negative for cough and shortness of breath.   Cardiovascular: Negative for chest pain and leg swelling.  Gastrointestinal: Positive for abdominal pain, diarrhea, nausea and vomiting. Negative for blood in stool.  Genitourinary: Negative for dysuria and hematuria.  Musculoskeletal: Negative for joint pain and myalgias.  Skin: Negative for itching and rash.  Neurological: Negative for focal weakness and weakness.  Psychiatric/Behavioral: Negative for substance abuse. The patient is not nervous/anxious.     Past Medical History:  Diagnosis Date  . Anxiety   . Crohn's disease (HCC)   . Diabetes type 1, uncontrolled (HCC) 11/14/2011   Since age 59  . Fibromyalgia   . Gastroparesis   . Hypertension   . Infection    UTI April 2016    Past Surgical History:  Procedure Laterality Date  . ANKLE SURGERY    . CHOLECYSTECTOMY  11/15/2011   Procedure: LAPAROSCOPIC CHOLECYSTECTOMY WITH INTRAOPERATIVE CHOLANGIOGRAM;  Surgeon: [name], MD;  Location: [facility];  Service: General;  Laterality: N/A;  . ESOPHAGOGASTRODUODENOSCOPY  12/03/2011   Procedure: ESOPHAGOGASTRODUODENOSCOPY (EGD);  Surgeon: Theda Belfast, MD;  Location: [facility];  Service: Endoscopy;  Laterality: N/A;  . FLEXIBLE SIGMOIDOSCOPY N/A 03/10/2017   Procedure: FLEXIBLE SIGMOIDOSCOPY;  Surgeon: Jeani Hawking, MD;  Location: [facility];  Service: Endoscopy;  Laterality: N/A;  . INCISION AND DRAINAGE PERIRECTAL ABSCESS N/A 03/01/2017   Procedure: IRRIGATION AND DEBRIDEMENT PERIRECTAL ABSCESS;  Surgeon: Ovidio Kin,  MD;  Location: [facility];  Service: General;  Laterality: N/A;  . IRRIGATION AND DEBRIDEMENT BUTTOCKS N/A 03/23/2017   Procedure: IRRIGATION AND DEBRIDEMENT BUTTOCKS, SETON PLACEMENT;  Surgeon: Romie Levee, MD;  Location: [facility];  Service: General;  Laterality: N/A;  . LAPAROSCOPY  11/23/2011   Procedure: LAPAROSCOPY DIAGNOSTIC;  Surgeon: Mariella Saa, MD;  Location: [facility];  Service: General;  Laterality: N/A;     reports that she has never smoked. She has never used smokeless tobacco. She reports that she does not drink alcohol or use drugs.  Allergies  Allergen Reactions  . Other Anaphylaxis    Reaction to [place] nuts   . Lactose Intolerance (Gi) Diarrhea    Family History  Problem Relation Age of Onset  . Diabetes Mother   . Hypertension Father     Prior to Admission medications   Medication Sig Start Date End Date Taking? Authorizing Provider  acetaminophen (TYLENOL) 500 MG tablet Take 1,000 mg by mouth daily as needed for moderate pain.    Yes [provider]  alum & mag hydroxide-simeth (MAALOX ADVANCED MAX ST) 400-400-40 MG/5ML suspension Take 10 mLs by mouth every 6 (six) hours as needed for indigestion. 05/31/17  Yes Cardama, Amadeo Garnet, MD  cholestyramine light (PREVALITE) 4 g packet Take 1 packet (4 g total) by mouth 3 (three) times daily. Patient taking differently: Take 4 g by mouth 2 (two) times daily as needed (diarrhea).  03/14/17  Yes Rolly Salter, MD  ciprofloxacin (CIPRO) 500 MG tablet Take 1 tablet (500 mg total) by mouth 2 (two) times daily. 06/19/17  Yes Terrilee Files, MD  citalopram (CELEXA) 20 MG tablet Take 1 tablet (20 mg total) by mouth daily. 05/13/17  Yes Henson, Vickie L, NP-C  cyclobenzaprine (FLEXERIL) 10 MG tablet Take 1 tablet (10 mg total) by mouth 2 (two) times daily as needed for muscle spasms. 05/21/17  Yes Khatri, Hina, PA-C  diphenoxylate-atropine (LOMOTIL) 2.5-0.025 MG tablet Take 2 tablets by mouth 4 (four) times daily. 05/18/17  Yes Henson, Vickie L, NP-C  famotidine (PEPCID) 40 MG tablet Take 1 tablet (40 mg total) by mouth 2 (two) times daily. 06/21/17  Yes Bethel Born, PA-C  ferrous sulfate 325 (65 FE) MG  tablet Take 1 tablet (325 mg total) by mouth 2 (two) times daily with a meal. 05/13/17  Yes Henson, Vickie L, NP-C  folic acid (FOLVITE) 1 MG tablet Take 1 tablet (1 mg total) by mouth daily. 05/13/17  Yes Henson, Vickie L, NP-C  gabapentin (NEURONTIN) 300 MG capsule Take 1 capsule (300 mg total) by mouth 2 (two) times daily. 04/03/17  Yes Henson, Vickie L, NP-C  insulin lispro (HUMALOG) 100 UNIT/ML injection Inject 0.02 mLs (2 Units total) into the skin 3 (three) times daily with meals. Patient taking differently: Inject 0-9 Units into the skin 3 (three) times daily with meals. Sliding scale 04/09/17  Yes Romero Belling, MD  lidocaine (LIDODERM) 5 % Place 1 patch onto the skin daily. Remove & Discard patch within 12 hours or as directed by MD Patient taking differently: Place 1 patch onto the skin daily as needed (pain). Remove & Discard patch within 12 hours or as directed by MD 05/21/17  Yes Khatri, Hina, PA-C  lidocaine (XYLOCAINE) 2 % solution Use as directed 15 mLs in the mouth or throat every 6 (six) hours as needed (stomach pain). 05/31/17  Yes Cardama, Amadeo Garnet, MD  lipase/protease/amylase (CREON) 36000 UNITS CPEP capsule Take 1 capsule (  36,000 Units total) by mouth 3 (three) times daily with meals. 03/14/17  Yes Rolly Salter, MD  loperamide (IMODIUM) 2 MG capsule Take 1 capsule (2 mg total) by mouth 3 (three) times daily before meals. DO NOT TAKE IT FOR THE DAY YOU DONT HAVE A BOWEL MOVEMENT. Patient taking differently: Take 2 mg by mouth 3 (three) times daily as needed for diarrhea or loose stools.  04/03/17  Yes Henson, Vickie L, NP-C  metoCLOPramide (REGLAN) 5 MG tablet Take 5 mg by mouth every 6 (six) hours as needed for nausea or vomiting.    Yes [provider]  metroNIDAZOLE (FLAGYL) 500 MG tablet Take 1 tablet (500 mg total) by mouth 2 (two) times daily. 06/19/17  Yes Terrilee Files, MD  oxyCODONE-acetaminophen (PERCOCET/ROXICET) 5-325 MG tablet Take 1 tablet by mouth every 6  (six) hours as needed for severe pain. 06/20/17  Yes Petrucelli, Samantha R, PA-C  promethazine (PHENERGAN) 25 MG tablet Take 1 tablet (25 mg total) by mouth every 6 (six) hours as needed for nausea or vomiting. 06/20/17  Yes Petrucelli, Samantha R, PA-C  sucralfate (CARAFATE) 1 GM/10ML suspension Take 10 mLs (1 g total) by mouth 4 (four) times daily -  with meals and at bedtime. 06/17/17  Yes Upstill, Melvenia Beam, PA-C  EPINEPHrine (EPIPEN 2-PAK) 0.3 mg/0.3 mL IJ SOAJ injection Inject 0.3 mLs (0.3 mg total) into the muscle as needed (allergic reaction). Patient taking differently: Inject 0.3 mg into the muscle once as needed (severe allergic reaction).  03/21/17   Glade Lloyd, MD  furosemide (LASIX) 40 MG tablet Take 40 mg by mouth daily as needed for fluid.     [provider]  glucose 4 GM chewable tablet Chew 1 tablet (4 g total) by mouth as needed for low blood sugar. Patient taking differently: Chew 1 tablet by mouth once as needed for low blood sugar.  08/10/14   Orvilla Cornwall A, CNM  LANTUS 100 UNIT/ML injection Inject 0.18 mLs (18 Units total) into the skin every morning. Patient taking differently: Inject 18 Units into the skin daily before breakfast.  05/07/17   Romero Belling, MD    Physical Exam:  Constitutional: Young female who appears to be in some discomfort   Vitals:   06/23/17 1106 06/23/17 1717  BP: (!) 128/104 (!) 181/120  Pulse: (!) 109 (!) 114  Resp: 18 17  Temp: 98.9 F (37.2 C)   TempSrc: Oral   SpO2: 99% 100%  Weight: 59 kg (130 lb)   Height: 5\' 7"  (1.702 m)    Eyes: PERRL, lids and conjunctivae normal ENMT: Mucous membranes are dry. Posterior pharynx clear of any exudate or lesions.Normal dentition.  Neck: normal, supple, no masses, no thyromegaly Respiratory: clear to auscultation bilaterally, no wheezing, no crackles. Normal respiratory effort. No accessory muscle use.  Cardiovascular: Tachycardic, no murmurs / rubs / gallops. No extremity edema. 2+ pedal  pulses. No carotid bruits.  Abdomen: Mild lower abdominal tenderness, no peritoneal signs noted, and no masses palpated. No hepatosplenomegaly. Bowel sounds positive.  Musculoskeletal: no clubbing / cyanosis. No joint deformity upper and lower extremities. Good ROM, no contractures. Normal muscle tone.  Skin: no rashes, lesions, ulcers. No induration Neurologic: CN 2-12 grossly intact. Sensation intact, DTR normal. Strength 5/5 in all 4.  Psychiatric: Normal judgment and insight. Alert and oriented x 3. Normal mood.     Labs on Admission: I have personally reviewed following labs and imaging studies  CBC: Recent Labs  Lab 06/19/17 1210  06/21/17 1404 06/23/17 1225  WBC 4.4 3.9* 3.7*  HGB 11.0* 10.9* 11.1*  HCT 32.1* 31.1* 31.6*  MCV 82.9 81.6 80.6  PLT 216 187 192   Basic Metabolic Panel: Recent Labs  Lab 06/19/17 1210 06/21/17 1404 06/23/17 1225  NA 139 136 137  K 3.4* 3.3* 3.3*  CL 100* 96* 96*  CO2 29 26 29   GLUCOSE 201* 169* 286*  BUN 5* <5* <5*  CREATININE 0.48 0.60 0.56  CALCIUM 9.2 9.0 9.1   GFR: Estimated Creatinine Clearance: 98.4 mL/min (by C-G formula based on SCr of 0.56 mg/dL). Liver Function Tests: Recent Labs  Lab 06/19/17 1210 06/21/17 1404 06/23/17 1225  AST 17 20 16   ALT 16 14 11*  ALKPHOS 83 80 81  BILITOT 0.7 0.8 0.6  PROT 7.4 7.4 7.5  ALBUMIN 3.5 3.3* 3.7   Recent Labs  Lab 06/19/17 1210 06/21/17 1404 06/23/17 1225  LIPASE 20 20 22    No results for input(s): AMMONIA in the last 168 hours. Coagulation Profile: No results for input(s): INR, PROTIME in the last 168 hours. Cardiac Enzymes: No results for input(s): CKTOTAL, CKMB, CKMBINDEX, TROPONINI in the last 168 hours. BNP (last 3 results) No results for input(s): PROBNP in the last 8760 hours. HbA1C: No results for input(s): HGBA1C in the last 72 hours. CBG: Recent Labs  Lab 06/19/17 1129  GLUCAP 196*   Lipid Profile: No results for input(s): CHOL, HDL, LDLCALC, TRIG,  CHOLHDL, LDLDIRECT in the last 72 hours. Thyroid Function Tests: No results for input(s): TSH, T4TOTAL, FREET4, T3FREE, THYROIDAB in the last 72 hours. Anemia Panel: No results for input(s): VITAMINB12, FOLATE, FERRITIN, TIBC, IRON, RETICCTPCT in the last 72 hours. Urine analysis:    Component Value Date/Time   COLORURINE YELLOW 06/23/2017 1721   APPEARANCEUR CLEAR 06/23/2017 1721   LABSPEC 1.012 06/23/2017 1721   PHURINE 6.0 06/23/2017 1721   GLUCOSEU 50 (A) 06/23/2017 1721   HGBUR MODERATE (A) 06/23/2017 1721   BILIRUBINUR NEGATIVE 06/23/2017 1721   BILIRUBINUR negative 01/02/2015 1717   KETONESUR 20 (A) 06/23/2017 1721   PROTEINUR 30 (A) 06/23/2017 1721   UROBILINOGEN 0.2 01/13/2015 0223   NITRITE NEGATIVE 06/23/2017 1721   LEUKOCYTESUR TRACE (A) 06/23/2017 1721   Sepsis Labs: No results found for this or any previous visit (from the past 240 hour(s)).   Radiological Exams on Admission: No results found.   Assessment/Plan SIRS/question sepsis 2/2 colitis, H/O Chron's disease: Patient presents tachycardic with WBC 3.7 on admission.  She noted to have signs of active colitis from CT abdomen pelvis on 3/22.  Despite taking ciprofloxacin and metronidazole patient reports continued pain and decreased p.o. Intake.  Patient previously followed by Dr. Loreta Ave [facility], but in the process of switching to [facility]. - Admit to MedSurg bed - Monitor intake and output - Add on lactic acid - Check for C. Difficile  - N.p.o., and will need to advance diet as tolerated - Changed antibiotics to IV Zosyn - IV fluids of normal saline at 100 mL/h - IV morphine prn pain - If symptoms persist may need repeat imaging - Recheck CBC in a.m. - May want to consult to GI in a.m.  Hypokalemia: Acute on chronic.  Initial potassium 3.3 on admission. - Give 30 mEq of potassium chloride IV - Continue to monitor and replace as needed  Diabetes mellitus type 2, history of gastroparesis : Patient's last  hemoglobin A1c was noted to be 5.7 on 04/10/2017. - Hypoglycemic protocol - Continue home regimen  of Lantus - CBGs every 4 hours with sensitive SSI - Consider restarting gastroparesis medications once tolerating p.o.  Nausea and vomiting: Acute on chronic.  Suspect secondary to above. - Emetics as tolerated  Normocytic normochromic anemia: Hemoglobin 11.1 on admission which appears near patient's baseline of 10-11. - Continue ferrous sulfate and folic acid  Depression - Continue Celexa   GERD - Continue Pepcid and Carafate  DVT prophylaxis: lovenox  Code Status: Full  Family Communication: none Disposition Plan: Discharge home in 2-3 days Consults called: none Admission status: Observation  Clydie Braun MD [facility] Pager [phone]   If 7PM-7AM, please contact night-coverage [email or web address] Password [facility]  06/23/2017, 7:48 PM

## 2017-06-23 NOTE — ED Provider Notes (Signed)
[facility]-EMERGENCY DEPT Provider Note   CSN: [id number] Arrival date & time: 06/23/17  1025     History   Chief Complaint No chief complaint on file.   HPI [name] is a 27 y.o. female.  HPI   [name] is a 27 y.o. female, with a history of Crohn's, anxiety, DM type I, fibromyalgia, gastroparesis, and HTN, presenting to the ED with abdominal pain since 3/21. Pain is constant, cramping/sharp, 9/10, epigastric and LUQ.  Seen on 3/22 in the ED, diagnosed with colitis via CT, prescribed cipro and flagyl as well as Percocet, and told to follow up with GI.  Patient was also seen in the ED on 3/23 and 3/24 for similar complaints, she was again prescribed Percocet. States pain has continued and she feels as though it is worsening. Accompanied by nausea and diarrhea. Having 5-6 stools over 24 hours. She is not currently established with a GI practice. She was seeing Dr. Loreta Ave, but they told her she is no longer a patient at that practice and would not make an appt for her.   Patient was last course of antibiotics was clindamycin prescribed February 14 for 14 days.  Denies fever, hematochezia/melena, vomiting, abnormal vaginal discharge or bleeding, or any other complaints.  Past Medical History:  Diagnosis Date  . Anxiety   . Crohn's disease (HCC)   . Diabetes type 1, uncontrolled (HCC) 11/14/2011   Since age 89  . Fibromyalgia   . Gastroparesis   . Hypertension   . Infection    UTI April 2016    Patient Active Problem List   Diagnosis Date Noted  . Intractable nausea and vomiting 04/11/2017  . Nausea and vomiting 04/10/2017  . Essential hypertension   . Colitis   . Crohn's disease (HCC) 04/03/2017  . Abscess and cellulitis of gluteal region 03/24/2017  . Cellulitis and abscess of buttock 03/23/2017  . Malnutrition of moderate degree 03/19/2017  . Anasarca 03/16/2017  . MDD (major depressive disorder), recurrent episode, mild (HCC)  02/28/2017  . Diarrhea 02/27/2017  . Lower GI bleed 02/27/2017  . DKA (diabetic ketoacidosis) (HCC) 02/26/2017  . Acute metabolic encephalopathy   . DKA, type 1 (HCC) 06/11/2013  . Orthostatic hypotension dysautonomic syndrome (HCC) 04/28/2013  . Body aches 04/28/2013  . Orthostatic hypotension 04/28/2013  . Diabetes mellitus type 1 (HCC) 04/28/2013  . Protein-calorie malnutrition, severe (HCC) 04/28/2013  . Noncompliance with diabetes treatment 09/01/2012  . Dyspnea 02/28/2012  . Sinus tachycardia 02/28/2012  . Hyperglycemia without ketosis 02/28/2012  . Chest pain 12/04/2011  . Diabetes type 1, uncontrolled (HCC) 11/14/2011  . Hypertension associated with diabetes (HCC) 11/14/2011  . Gastroparesis 11/14/2011  . Hyponatremia 09/19/2011  . Abdominal pain 09/18/2011  . Metabolic alkalosis 09/18/2011  . Diabetic hyperosmolar non-ketotic state (HCC) 09/18/2011  . DIAB W/UNSPEC COMP TYPE I [JUV TYPE] UNCNTRL 06/04/2010  . CONSTIPATION 01/01/2010  . RECTAL PAIN 01/01/2010    Past Surgical History:  Procedure Laterality Date  . ANKLE SURGERY    . CHOLECYSTECTOMY  11/15/2011   Procedure: LAPAROSCOPIC CHOLECYSTECTOMY WITH INTRAOPERATIVE CHOLANGIOGRAM;  Surgeon: [name], MD;  Location: [facility];  Service: General;  Laterality: N/A;  . ESOPHAGOGASTRODUODENOSCOPY  12/03/2011   Procedure: ESOPHAGOGASTRODUODENOSCOPY (EGD);  Surgeon: Theda Belfast, MD;  Location: [facility];  Service: Endoscopy;  Laterality: N/A;  . FLEXIBLE SIGMOIDOSCOPY N/A 03/10/2017   Procedure: FLEXIBLE SIGMOIDOSCOPY;  Surgeon: Jeani Hawking, MD;  Location: [facility];  Service: Endoscopy;  Laterality: N/A;  .  INCISION AND DRAINAGE PERIRECTAL ABSCESS N/A 03/01/2017   Procedure: IRRIGATION AND DEBRIDEMENT PERIRECTAL ABSCESS;  Surgeon: Ovidio Kin, MD;  Location: [facility];  Service: General;  Laterality: N/A;  . IRRIGATION AND DEBRIDEMENT BUTTOCKS N/A 03/23/2017   Procedure: IRRIGATION AND DEBRIDEMENT BUTTOCKS,  SETON PLACEMENT;  Surgeon: Romie Levee, MD;  Location: [facility];  Service: General;  Laterality: N/A;  . LAPAROSCOPY  11/23/2011   Procedure: LAPAROSCOPY DIAGNOSTIC;  Surgeon: Mariella Saa, MD;  Location: [facility];  Service: General;  Laterality: N/A;     OB History    Gravida  1   Para  1   Term      Preterm  1   AB      Living  1     SAB      TAB      Ectopic      Multiple  0   Live Births  1            Home Medications    Prior to Admission medications   Medication Sig Start Date End Date Taking? Authorizing Provider  acetaminophen (TYLENOL) 500 MG tablet Take 1,000 mg by mouth daily as needed for moderate pain.    Yes [provider]  alum & mag hydroxide-simeth (MAALOX ADVANCED MAX ST) 400-400-40 MG/5ML suspension Take 10 mLs by mouth every 6 (six) hours as needed for indigestion. 05/31/17  Yes Cardama, Amadeo Garnet, MD  cholestyramine light (PREVALITE) 4 g packet Take 1 packet (4 g total) by mouth 3 (three) times daily. Patient taking differently: Take 4 g by mouth 2 (two) times daily as needed (diarrhea).  03/14/17  Yes Rolly Salter, MD  ciprofloxacin (CIPRO) 500 MG tablet Take 1 tablet (500 mg total) by mouth 2 (two) times daily. 06/19/17  Yes Terrilee Files, MD  citalopram (CELEXA) 20 MG tablet Take 1 tablet (20 mg total) by mouth daily. 05/13/17  Yes Henson, Vickie L, NP-C  cyclobenzaprine (FLEXERIL) 10 MG tablet Take 1 tablet (10 mg total) by mouth 2 (two) times daily as needed for muscle spasms. 05/21/17  Yes Khatri, Hina, PA-C  diphenoxylate-atropine (LOMOTIL) 2.5-0.025 MG tablet Take 2 tablets by mouth 4 (four) times daily. 05/18/17  Yes Henson, Vickie L, NP-C  famotidine (PEPCID) 40 MG tablet Take 1 tablet (40 mg total) by mouth 2 (two) times daily. 06/21/17  Yes Bethel Born, PA-C  ferrous sulfate 325 (65 FE) MG tablet Take 1 tablet (325 mg total) by mouth 2 (two) times daily with a meal. 05/13/17  Yes Henson, Vickie L, NP-C  folic  acid (FOLVITE) 1 MG tablet Take 1 tablet (1 mg total) by mouth daily. 05/13/17  Yes Henson, Vickie L, NP-C  gabapentin (NEURONTIN) 300 MG capsule Take 1 capsule (300 mg total) by mouth 2 (two) times daily. 04/03/17  Yes Henson, Vickie L, NP-C  insulin lispro (HUMALOG) 100 UNIT/ML injection Inject 0.02 mLs (2 Units total) into the skin 3 (three) times daily with meals. Patient taking differently: Inject 0-9 Units into the skin 3 (three) times daily with meals. Sliding scale 04/09/17  Yes Romero Belling, MD  lidocaine (LIDODERM) 5 % Place 1 patch onto the skin daily. Remove & Discard patch within 12 hours or as directed by MD Patient taking differently: Place 1 patch onto the skin daily as needed (pain). Remove & Discard patch within 12 hours or as directed by MD 05/21/17  Yes Khatri, Hina, PA-C  lidocaine (XYLOCAINE) 2 % solution Use as directed 15  mLs in the mouth or throat every 6 (six) hours as needed (stomach pain). 05/31/17  Yes Cardama, Amadeo Garnet, MD  lipase/protease/amylase (CREON) 36000 UNITS CPEP capsule Take 1 capsule (36,000 Units total) by mouth 3 (three) times daily with meals. 03/14/17  Yes Rolly Salter, MD  loperamide (IMODIUM) 2 MG capsule Take 1 capsule (2 mg total) by mouth 3 (three) times daily before meals. DO NOT TAKE IT FOR THE DAY YOU DONT HAVE A BOWEL MOVEMENT. Patient taking differently: Take 2 mg by mouth 3 (three) times daily as needed for diarrhea or loose stools.  04/03/17  Yes Henson, Vickie L, NP-C  metoCLOPramide (REGLAN) 5 MG tablet Take 5 mg by mouth every 6 (six) hours as needed for nausea or vomiting.    Yes [provider]  metroNIDAZOLE (FLAGYL) 500 MG tablet Take 1 tablet (500 mg total) by mouth 2 (two) times daily. 06/19/17  Yes Terrilee Files, MD  oxyCODONE-acetaminophen (PERCOCET/ROXICET) 5-325 MG tablet Take 1 tablet by mouth every 6 (six) hours as needed for severe pain. 06/20/17  Yes Petrucelli, Samantha R, PA-C  promethazine (PHENERGAN) 25 MG tablet  Take 1 tablet (25 mg total) by mouth every 6 (six) hours as needed for nausea or vomiting. 06/20/17  Yes Petrucelli, Samantha R, PA-C  sucralfate (CARAFATE) 1 GM/10ML suspension Take 10 mLs (1 g total) by mouth 4 (four) times daily -  with meals and at bedtime. 06/17/17  Yes Upstill, Melvenia Beam, PA-C  EPINEPHrine (EPIPEN 2-PAK) 0.3 mg/0.3 mL IJ SOAJ injection Inject 0.3 mLs (0.3 mg total) into the muscle as needed (allergic reaction). Patient taking differently: Inject 0.3 mg into the muscle once as needed (severe allergic reaction).  03/21/17   Glade Lloyd, MD  furosemide (LASIX) 40 MG tablet Take 40 mg by mouth daily as needed for fluid.     [provider]  glucose 4 GM chewable tablet Chew 1 tablet (4 g total) by mouth as needed for low blood sugar. Patient taking differently: Chew 1 tablet by mouth once as needed for low blood sugar.  08/10/14   Orvilla Cornwall A, CNM  LANTUS 100 UNIT/ML injection Inject 0.18 mLs (18 Units total) into the skin every morning. Patient taking differently: Inject 18 Units into the skin daily before breakfast.  05/07/17   Romero Belling, MD    Family History Family History  Problem Relation Age of Onset  . Diabetes Mother   . Hypertension Father     Social History Social History   Tobacco Use  . Smoking status: Never Smoker  . Smokeless tobacco: Never Used  Substance Use Topics  . Alcohol use: No    Alcohol/week: 0.0 oz    Frequency: Never  . Drug use: No     Allergies   Other and Lactose intolerance (gi)   Review of Systems Review of Systems  Constitutional: Negative for chills and fever.  Respiratory: Negative for shortness of breath.   Cardiovascular: Negative for chest pain.  Gastrointestinal: Positive for abdominal pain, diarrhea and nausea. Negative for blood in stool and vomiting.  Genitourinary: Negative for dysuria, hematuria, vaginal bleeding and vaginal discharge.  All other systems reviewed and are negative.    Physical  Exam Updated Vital Signs BP (!) 128/104   Pulse (!) 109   Temp 98.9 F (37.2 C) (Oral)   Resp 18   Ht 5\' 7"  (1.702 m)   Wt 59 kg (130 lb)   SpO2 99%   BMI 20.36 kg/m   Physical  Exam  Constitutional: She appears well-developed and well-nourished. No distress.  HENT:  Head: Normocephalic and atraumatic.  Eyes: Conjunctivae are normal.  Neck: Neck supple.  Cardiovascular: Regular rhythm, normal heart sounds and intact distal pulses. Tachycardia present.  Pulmonary/Chest: Effort normal and breath sounds normal. No respiratory distress.  Abdominal: Soft. Bowel sounds are normal. There is tenderness. There is no guarding.    Musculoskeletal: She exhibits no edema.  Lymphadenopathy:    She has no cervical adenopathy.  Neurological: She is alert.  Skin: Skin is warm and dry. She is not diaphoretic.  Psychiatric: She has a normal mood and affect. Her behavior is normal.  Nursing note and vitals reviewed.    ED Treatments / Results  Labs (all labs ordered are listed, but only abnormal results are displayed) Labs Reviewed  COMPREHENSIVE METABOLIC PANEL - Abnormal; Notable for the following components:      Result Value   Potassium 3.3 (*)    Chloride 96 (*)    Glucose, Bld 286 (*)    BUN <5 (*)    ALT 11 (*)    All other components within normal limits  CBC - Abnormal; Notable for the following components:   WBC 3.7 (*)    Hemoglobin 11.1 (*)    HCT 31.6 (*)    All other components within normal limits  URINALYSIS, ROUTINE W REFLEX MICROSCOPIC - Abnormal; Notable for the following components:   Glucose, UA 50 (*)    Hgb urine dipstick MODERATE (*)    Ketones, ur 20 (*)    Protein, ur 30 (*)    Leukocytes, UA TRACE (*)    Squamous Epithelial / LPF 0-5 (*)    All other components within normal limits  C DIFFICILE QUICK SCREEN W PCR REFLEX  LIPASE, BLOOD  LACTIC ACID, PLASMA  CBC  BASIC METABOLIC PANEL  I-STAT BETA HCG BLOOD, ED ([facility], [facility], [facility] ONLY)     EKG None  Radiology No results found.  Procedures Procedures (including critical care time)  Medications Ordered in ED Medications  diphenoxylate-atropine (LOMOTIL) 2.5-0.025 MG per tablet 2 tablet (has no administration in time range)  famotidine (PEPCID) tablet 40 mg (has no administration in time range)  ferrous sulfate tablet 325 mg (has no administration in time range)  folic acid (FOLVITE) tablet 1 mg (has no administration in time range)  insulin glargine (LANTUS) injection 18 Units (has no administration in time range)  lidocaine (LIDODERM) 5 % 1 patch (has no administration in time range)  lidocaine (XYLOCAINE) 2 % viscous mouth solution 15 mL (has no administration in time range)  lipase/protease/amylase (CREON) capsule 36,000 Units (has no administration in time range)  sucralfate (CARAFATE) 1 GM/10ML suspension 1 g (has no administration in time range)  gabapentin (NEURONTIN) capsule 300 mg (has no administration in time range)  enoxaparin (LOVENOX) injection 40 mg (has no administration in time range)  potassium chloride 10 mEq in 100 mL IVPB (has no administration in time range)  0.9 %  sodium chloride infusion (has no administration in time range)  acetaminophen (TYLENOL) tablet 650 mg (has no administration in time range)    Or  acetaminophen (TYLENOL) suppository 650 mg (has no administration in time range)  ondansetron (ZOFRAN) tablet 4 mg (has no administration in time range)    Or  ondansetron (ZOFRAN) injection 4 mg (has no administration in time range)  hydrALAZINE (APRESOLINE) injection 10 mg (has no administration in time range)  insulin aspart (novoLOG) injection 0-9 Units (has  no administration in time range)  piperacillin-tazobactam (ZOSYN) IVPB 3.375 g (has no administration in time range)  dicyclomine (BENTYL) capsule 20 mg (20 mg Oral Given 06/23/17 1726)  sodium chloride 0.9 % bolus 1,000 mL (0 mLs Intravenous Stopped 06/23/17 1854)  morphine 4  MG/ML injection 4 mg (4 mg Intravenous Given 06/23/17 1822)     Initial Impression / Assessment and Plan / ED Course  I have reviewed the triage vital signs and the nursing notes.  Pertinent labs & imaging results that were available during my care of the patient were reviewed by me and considered in my medical decision making (see chart for details).  Clinical Course as of Jun 24 2034  Tue Jun 23, 2017  1804 Patient states her pain is still 8/10.   [SJ]  1918 Rates her pain 6/10.   [SJ]  2003 Spoke with Dr. Katrinka Blazing, hospitalist. Agrees to admit the patient.   [SJ]    Clinical Course User Index [SJ] Makenly Larabee C, PA-C    Patient presents with persistent abdominal pain.  Known diagnosis of colitis via CT scan.  Patient has been seen in the ED multiple times since that time and has been prescribed a variety of medications.  Her pain persists and is uncontrolled by oral medications.  She is also having difficulty keep ing her medications down. Admission for intractable pain.   Findings and plan of care discussed with Mancel Bale, MD.   Vitals:   06/23/17 1106 06/23/17 1717 06/23/17 1900  BP: (!) 128/104 (!) 181/120 (!) 148/98  Pulse: (!) 109 (!) 114 (!) 107  Resp: 18 17   Temp: 98.9 F (37.2 C)    TempSrc: Oral    SpO2: 99% 100% 100%  Weight: 59 kg (130 lb)    Height: 5\' 7"  (1.702 m)       Final Clinical Impressions(s) / ED Diagnoses   Final diagnoses:  Left upper quadrant pain    ED Discharge Orders    None       Concepcion Living 06/23/17 2039    Mancel Bale, MD 06/24/17 [phone]

## 2017-06-23 NOTE — ED Triage Notes (Signed)
Patient came to ER for abdominal pain for one week now. Patient was here Friday and they told her is was colitis. Patient has not been able to eat much since then and now having pain that radiates to her back. Patient was prescribed Antibiotics Cipro and flagyl and are still currently taking these medications. Denies vomiting but states she is nauseous.

## 2017-06-23 NOTE — ED Notes (Addendum)
Transport contacted to help bring patient up.

## 2017-06-24 ENCOUNTER — Other Ambulatory Visit: Payer: Self-pay

## 2017-06-24 DIAGNOSIS — G8929 Other chronic pain: Secondary | ICD-10-CM | POA: Diagnosis present

## 2017-06-24 DIAGNOSIS — F418 Other specified anxiety disorders: Secondary | ICD-10-CM | POA: Diagnosis present

## 2017-06-24 DIAGNOSIS — Z91011 Allergy to milk products: Secondary | ICD-10-CM | POA: Diagnosis not present

## 2017-06-24 DIAGNOSIS — R1012 Left upper quadrant pain: Secondary | ICD-10-CM | POA: Diagnosis not present

## 2017-06-24 DIAGNOSIS — R197 Diarrhea, unspecified: Secondary | ICD-10-CM | POA: Diagnosis not present

## 2017-06-24 DIAGNOSIS — R112 Nausea with vomiting, unspecified: Secondary | ICD-10-CM | POA: Diagnosis not present

## 2017-06-24 DIAGNOSIS — D649 Anemia, unspecified: Secondary | ICD-10-CM | POA: Diagnosis present

## 2017-06-24 DIAGNOSIS — E876 Hypokalemia: Secondary | ICD-10-CM | POA: Diagnosis not present

## 2017-06-24 DIAGNOSIS — I10 Essential (primary) hypertension: Secondary | ICD-10-CM | POA: Diagnosis not present

## 2017-06-24 DIAGNOSIS — K219 Gastro-esophageal reflux disease without esophagitis: Secondary | ICD-10-CM | POA: Diagnosis not present

## 2017-06-24 DIAGNOSIS — K3184 Gastroparesis: Secondary | ICD-10-CM | POA: Diagnosis not present

## 2017-06-24 DIAGNOSIS — E1043 Type 1 diabetes mellitus with diabetic autonomic (poly)neuropathy: Secondary | ICD-10-CM | POA: Diagnosis not present

## 2017-06-24 DIAGNOSIS — M797 Fibromyalgia: Secondary | ICD-10-CM | POA: Diagnosis present

## 2017-06-24 DIAGNOSIS — Z794 Long term (current) use of insulin: Secondary | ICD-10-CM | POA: Diagnosis not present

## 2017-06-24 DIAGNOSIS — R933 Abnormal findings on diagnostic imaging of other parts of digestive tract: Secondary | ICD-10-CM | POA: Diagnosis not present

## 2017-06-24 DIAGNOSIS — R110 Nausea: Secondary | ICD-10-CM | POA: Diagnosis not present

## 2017-06-24 DIAGNOSIS — K529 Noninfective gastroenteritis and colitis, unspecified: Secondary | ICD-10-CM | POA: Diagnosis not present

## 2017-06-24 DIAGNOSIS — Z91018 Allergy to other foods: Secondary | ICD-10-CM | POA: Diagnosis not present

## 2017-06-24 DIAGNOSIS — Z833 Family history of diabetes mellitus: Secondary | ICD-10-CM | POA: Diagnosis not present

## 2017-06-24 DIAGNOSIS — R1084 Generalized abdominal pain: Secondary | ICD-10-CM | POA: Diagnosis not present

## 2017-06-24 LAB — BASIC METABOLIC PANEL
Anion gap: 10 (ref 5–15)
BUN: <5 mg/dL — ABNORMAL LOW (ref 6–20)
CO2: 25 mmol/L (ref 22–32)
Calcium: 8.6 mg/dL — ABNORMAL LOW (ref 8.9–10.3)
Chloride: 102 mmol/L (ref 101–111)
Creatinine, Ser: 0.45 mg/dL (ref 0.44–1.00)
GFR calc Af Amer: >60 mL/min (ref >60)
GFR calc non Af Amer: >60 mL/min (ref >60)
Glucose, Bld: 124 mg/dL — ABNORMAL HIGH (ref 65–99)
Potassium: 3.1 mmol/L — ABNORMAL LOW (ref 3.5–5.1)
Sodium: 137 mmol/L (ref 135–145)

## 2017-06-24 LAB — C DIFFICILE QUICK SCREEN W PCR REFLEX
C Diff antigen: POSITIVE — AB
C Diff toxin: NEGATIVE

## 2017-06-24 LAB — GLUCOSE, CAPILLARY
Glucose-Capillary: 107 mg/dL — ABNORMAL HIGH (ref 65–99)
Glucose-Capillary: 110 mg/dL — ABNORMAL HIGH (ref 65–99)
Glucose-Capillary: 133 mg/dL — ABNORMAL HIGH (ref 65–99)
Glucose-Capillary: 135 mg/dL — ABNORMAL HIGH (ref 65–99)
Glucose-Capillary: 175 mg/dL — ABNORMAL HIGH (ref 65–99)
Glucose-Capillary: 183 mg/dL — ABNORMAL HIGH (ref 65–99)

## 2017-06-24 LAB — CBC
HCT: 29.4 % — ABNORMAL LOW (ref 36.0–46.0)
Hemoglobin: 10.2 g/dL — ABNORMAL LOW (ref 12.0–15.0)
MCH: 28.7 pg (ref 26.0–34.0)
MCHC: 34.7 g/dL (ref 30.0–36.0)
MCV: 82.8 fL (ref 78.0–100.0)
Platelets: 184 10*3/uL (ref 150–400)
RBC: 3.55 MIL/uL — ABNORMAL LOW (ref 3.87–5.11)
RDW: 13.1 % (ref 11.5–15.5)
WBC: 4.2 10*3/uL (ref 4.0–10.5)

## 2017-06-24 LAB — MAGNESIUM: Magnesium: 1.7 mg/dL (ref 1.7–2.4)

## 2017-06-24 LAB — CLOSTRIDIUM DIFFICILE BY PCR, REFLEXED: Toxigenic C. Difficile by PCR: NEGATIVE

## 2017-06-24 MED ORDER — INSULIN GLARGINE 100 UNIT/ML ~~LOC~~ SOLN: 5.0000 [IU] | Freq: Every day | SUBCUTANEOUS | Status: DC

## 2017-06-24 MED ORDER — PANTOPRAZOLE SODIUM 40 MG IV SOLR
40.0000 mg | Freq: Two times a day (BID) | INTRAVENOUS | Status: DC
  Administered 2017-06-24 – 2017-06-27 (×7): 40 mg via INTRAVENOUS
  Filled 2017-06-24 (×7): qty 40

## 2017-06-24 MED ORDER — HYDROMORPHONE HCL 1 MG/ML IJ SOLN
1.0000 mg | INTRAMUSCULAR | Status: DC | PRN
Start: 2017-06-24 — End: 2017-06-27
  Administered 2017-06-24 – 2017-06-27 (×18): 1 mg via INTRAVENOUS
  Filled 2017-06-24 (×19): qty 1

## 2017-06-24 MED ORDER — MAGNESIUM SULFATE 2 GM/50ML IV SOLN
2.0000 g | Freq: Once | INTRAVENOUS | Status: AC
  Administered 2017-06-24: 2 g via INTRAVENOUS
  Filled 2017-06-24: qty 50

## 2017-06-24 MED ORDER — POTASSIUM CHLORIDE 10 MEQ/100ML IV SOLN
10.0000 meq | INTRAVENOUS | Status: AC
  Administered 2017-06-24 (×2): 10 meq via INTRAVENOUS
  Filled 2017-06-24: qty 100

## 2017-06-24 MED ORDER — POTASSIUM CHLORIDE 10 MEQ/100ML IV SOLN
10.0000 meq | INTRAVENOUS | Status: AC
  Administered 2017-06-24 (×2): 10 meq via INTRAVENOUS
  Filled 2017-06-24 (×4): qty 100

## 2017-06-24 MED ORDER — LACTATED RINGERS IV SOLN
INTRAVENOUS | Status: DC
  Administered 2017-06-24 – 2017-06-25 (×3): via INTRAVENOUS

## 2017-06-24 MED ORDER — ADULT MULTIVITAMIN W/MINERALS CH
1.0000 | ORAL_TABLET | Freq: Every day | ORAL | Status: DC
  Administered 2017-06-26 – 2017-06-27 (×2): 1 via ORAL
  Filled 2017-06-24 (×2): qty 1

## 2017-06-24 MED ORDER — INSULIN GLARGINE 100 UNIT/ML ~~LOC~~ SOLN
5.0000 [IU] | Freq: Every day | SUBCUTANEOUS | Status: DC
  Administered 2017-06-24 – 2017-06-25 (×2): 5 [IU] via SUBCUTANEOUS
  Filled 2017-06-24 (×2): qty 0.05

## 2017-06-24 MED ORDER — POTASSIUM CHLORIDE CRYS ER 20 MEQ PO TBCR
40.0000 meq | EXTENDED_RELEASE_TABLET | Freq: Once | ORAL | Status: DC
  Filled 2017-06-24 (×2): qty 2

## 2017-06-24 MED ORDER — PROMETHAZINE HCL 25 MG/ML IJ SOLN
12.5000 mg | Freq: Four times a day (QID) | INTRAMUSCULAR | Status: DC | PRN
  Administered 2017-06-24 – 2017-06-27 (×6): 12.5 mg via INTRAVENOUS
  Filled 2017-06-24 (×6): qty 1

## 2017-06-24 NOTE — Progress Notes (Signed)
Initial Nutrition Assessment  DOCUMENTATION CODES:   Not applicable  INTERVENTION:    Monitor for diet advancement/toleration  Glucerna Shake po TID, each supplement provides 220 kcal and 10 grams of protein once advanced  Provide MVI daily   NUTRITION DIAGNOSIS:   Inadequate oral intake related to altered GI function, nausea, vomiting as evidenced by per patient/family report.  GOAL:   Patient will meet greater than or equal to 90% of their needs  MONITOR:   PO intake, Supplement acceptance, Diet advancement, Weight trends, Labs  REASON FOR ASSESSMENT:   Malnutrition Screening Tool    ASSESSMENT:   Patient with PMH significant for Crohn's disease, gastroparesis, HTN, and T1DM. Presents this admission with complaints of crampy epigastric and left lower quadrant abdominal pain. Admitted for SIRS secondary to colitis.    Spoke with pt at bedside. Reports PO intake decreased to only Pedialyte and Gatorade over the last five days related to abdominal pain. Prior to these five days pt was consuming 2 larger meals with all food groups, snacks, and one Glucerna. Pt is currently NPO but would like Glucerna once advanced. Pt expressed concern about price of supplements at home. RD to provide coupons.   Pt endorses a UBW of 150 lb, the last time being at that weight in January. Records indicate pt weighed 146 lb 04/12/17 and 130 lb this admission. Weight recorded for this admission looks to be stated. RD obtained bed weight of 142 lb. Nutrition-Focused physical exam completed. Pt had history of moderate malnutrition in December, suspect nutrition status has improved during these three months at home.   Medications reviewed and include: ferrous sulfate, folic acid, SSI, Lantus, Creon, carafate, IV abx Labs reviewed: K 3.1 (L)   NUTRITION - FOCUSED PHYSICAL EXAM:    Most Recent Value  Orbital Region  No depletion  Upper Arm Region  No depletion  Thoracic and Lumbar Region  Unable  to assess  Buccal Region  No depletion  Temple Region  Moderate depletion  Clavicle Bone Region  Moderate depletion  Clavicle and Acromion Bone Region  Mild depletion  Scapular Bone Region  Unable to assess  Dorsal Hand  Mild depletion  Patellar Region  Moderate depletion  Anterior Thigh Region  Moderate depletion  Posterior Calf Region  Moderate depletion  Edema (RD Assessment)  None  Hair  Reviewed  Eyes  Reviewed  Mouth  Reviewed  Skin  Reviewed  Nails  Reviewed     Diet Order:  Diet NPO time specified Except for: Sips with Meds  EDUCATION NEEDS:   Education needs have been addressed  Skin:  Skin Assessment: Skin Integrity Issues: Skin Integrity Issues:: Incisions Incisions: perineum  Last BM:  06/24/17  Height:   Ht Readings from Last 1 Encounters:  06/23/17 5\' 7"  (1.702 m)    Weight:   Wt Readings from Last 1 Encounters:  06/23/17 130 lb (59 kg)    Ideal Body Weight:  61.2 kg  BMI:  Body mass index is 20.36 kg/m.  Estimated Nutritional Needs:   Kcal:  1600-1800 kcal/day  Protein:  80-90 g/day  Fluid:  >1.6 L/day    Vanessa Kick RD, LDN Clinical Nutrition Pager # [phone]

## 2017-06-24 NOTE — Progress Notes (Signed)
PROGRESS NOTE    [name]  QPR:[id number] DOB: 11-Nov-1990 DOA: 06/23/2017 PCP: Girtha Rm, NP-C    Brief Narrative:  27 year old female who presented with abdominal pain.  She does have a significant past medical history for hypertension, type 1 diabetes mellitus with gastroparesis, and Crohn's disease.  Reported abdominal pain for the last 5 days prior to hospitalization, colic in nature, located in the epigastric and left lower quadrant, associated with diarrhea.  She was diagnosed with colitis per CT scan, she received oral antibiotic therapy for 4 days.  Her symptoms were persistent despite outpatient therapy, decreased oral intake, abdominal pain, nausea and vomiting.  Initial physical examination blood pressure 128/104, heart rate 109, respiratory 18, temperature 98.9, dry mucous membranes, lungs clear to auscultation bilaterally, heart S1-S2 present tachycardic, the abdomen was tender at the lower quadrants, no rebound, no guarding, no lower extremity edema.  Sodium 137, potassium 3.3, chloride 96, bicarb 29, glucose 286, BUN is 25, creatinine 0.56, lipase 22, white count 3.7, hemoglobin 9.1, hematocrit 31.6, platelets 192, urinalysis had specific gravity 1.012.  C. difficile antigen positive, c diff toxin negative. HCG less than 5.0.   Patient was admitted to the hospital working diagnosis of colitis, complicated by sepsis.  Assessment & Plan:   Principal Problem:   Colitis Active Problems:   Gastroparesis   Diabetes mellitus type 1 (HCC)   Nausea and vomiting   SIRS (systemic inflammatory response syndrome) (HCC)   Hypokalemia   Normocytic normochromic anemia   GERD (gastroesophageal reflux disease)   1.  Colitis complicated by sepsis. Will continue pain control with hydromorphone due to severe and persistent pain, will continue to advance diet as tolerated, continue antiacid therapy, as needed antiemetics and continue IV fluids. Continue cholestyramine and bentyl. Will  continue IV antibiotic therapy with IV Zosyn, if no improvement may need to repeat ct abdomen and pelvis.   2.  Hypokalemia. Due to GI losses, will continue correction with kcl, follow renal panel in am.   3.  Type 2 diabetes mellitus. Continue glucose cover and monitoring with insulin sliding scale,. Advance diet as tolerated. Will reduce basal insulin to 5 units to prevent hypoglycemia. Capillary glucose 166,k 110, 133, 107, 135.   3.  Depression. Continue citalopram. No agitation or confusion.    DVT prophylaxis: enoxaparin   Code Status:  full Family Communication:  No family at the bedside Disposition Plan: home when clinically improved   Consultants:     Procedures:     Antimicrobials:       Subjective: Patient with persistent abdominal, moderate in intensity, lower quadrants and epigastric, no associated with nausea.   Objective: Vitals:   06/23/17 1900 06/23/17 2100 06/23/17 2225 06/24/17 0450  BP: (!) 148/98 (!) 152/112 (!) 162/110 122/72  Pulse: (!) 107 (!) 107 (!) 111 (!) 105  Resp:   17 18  Temp:   98.1 F (36.7 C) 98.1 F (36.7 C)  TempSrc:   Oral Oral  SpO2: 100% 100% 100% 100%  Weight:      Height:        Intake/Output Summary (Last 24 hours) at 06/24/2017 0815 Last data filed at 06/24/2017 0600 Gross per 24 hour  Intake 1801.67 ml  Output -  Net 1801.67 ml   Filed Weights   06/23/17 1106  Weight: 59 kg (130 lb)    Examination:   General: deconditioned and ill looking appearing Neurology: Awake and alert, non focal  E ENT: mild pallor, no icterus, oral  mucosa dry.  Cardiovascular: No JVD. S1-S2 present, rhythmic, no gallops, rubs, or murmurs. No lower extremity edema. Pulmonary: decreased breath sounds bilaterally at bases, adequate air movement, no wheezing, rhonchi or rales. Gastrointestinal. Abdomen with no distention, no organomegaly, no rebound or guarding. Tender to deep palpation at the lower quadrants.  Skin. No  rashes Musculoskeletal: no joint deformities     Data Reviewed: I have personally reviewed following labs and imaging studies  CBC: Recent Labs  Lab 06/19/17 1210 06/21/17 1404 06/23/17 1225 06/24/17 0449  WBC 4.4 3.9* 3.7* 4.2  HGB 11.0* 10.9* 11.1* 10.2*  HCT 32.1* 31.1* 31.6* 29.4*  MCV 82.9 81.6 80.6 82.8  PLT 216 187 192 585   Basic Metabolic Panel: Recent Labs  Lab 06/19/17 1210 06/21/17 1404 06/23/17 1225 06/24/17 0449  NA 139 136 137 137  K 3.4* 3.3* 3.3* 3.1*  CL 100* 96* 96* 102  CO2 29 26 29 25   GLUCOSE 201* 169* 286* 124*  BUN 5* <5* <5* <5*  CREATININE 0.48 0.60 0.56 0.45  CALCIUM 9.2 9.0 9.1 8.6*  MG  --   --   --  1.7   GFR: Estimated Creatinine Clearance: 98.4 mL/min (by C-G formula based on SCr of 0.45 mg/dL). Liver Function Tests: Recent Labs  Lab 06/19/17 1210 06/21/17 1404 06/23/17 1225  AST 17 20 16   ALT 16 14 11*  ALKPHOS 83 80 81  BILITOT 0.7 0.8 0.6  PROT 7.4 7.4 7.5  ALBUMIN 3.5 3.3* 3.7   Recent Labs  Lab 06/19/17 1210 06/21/17 1404 06/23/17 1225  LIPASE 20 20 22    No results for input(s): AMMONIA in the last 168 hours. Coagulation Profile: No results for input(s): INR, PROTIME in the last 168 hours. Cardiac Enzymes: No results for input(s): CKTOTAL, CKMB, CKMBINDEX, TROPONINI in the last 168 hours. BNP (last 3 results) No results for input(s): PROBNP in the last 8760 hours. HbA1C: No results for input(s): HGBA1C in the last 72 hours. CBG: Recent Labs  Lab 06/19/17 1129 06/23/17 2356 06/24/17 0357 06/24/17 0753  GLUCAP 196* 166* 110* 133*   Lipid Profile: No results for input(s): CHOL, HDL, LDLCALC, TRIG, CHOLHDL, LDLDIRECT in the last 72 hours. Thyroid Function Tests: No results for input(s): TSH, T4TOTAL, FREET4, T3FREE, THYROIDAB in the last 72 hours. Anemia Panel: No results for input(s): VITAMINB12, FOLATE, FERRITIN, TIBC, IRON, RETICCTPCT in the last 72 hours.    Radiology Studies: I have reviewed  all of the imaging during this hospital visit personally     Scheduled Meds: . citalopram  20 mg Oral Daily  . diphenoxylate-atropine  2 tablet Oral QID  . enoxaparin (LOVENOX) injection  40 mg Subcutaneous Q24H  . famotidine  40 mg Oral BID  . ferrous sulfate  325 mg Oral BID WC  . folic acid  1 mg Oral Daily  . gabapentin  300 mg Oral BID  . insulin aspart  0-9 Units Subcutaneous Q4H  . insulin glargine  18 Units Subcutaneous Daily  . lipase/protease/amylase  36,000 Units Oral TID WC  . sucralfate  1 g Oral TID WC & HS   Continuous Infusions: . sodium chloride 100 mL/hr at 06/23/17 2259  . piperacillin-tazobactam (ZOSYN)  IV 3.375 g (06/24/17 0539)     LOS: 0 days        Rithvik Orcutt Gerome Apley, MD [facility] Pager [phone]

## 2017-06-25 LAB — BASIC METABOLIC PANEL
Anion gap: 8 (ref 5–15)
BUN: <5 mg/dL — ABNORMAL LOW (ref 6–20)
CO2: 28 mmol/L (ref 22–32)
Calcium: 8.9 mg/dL (ref 8.9–10.3)
Chloride: 104 mmol/L (ref 101–111)
Creatinine, Ser: 0.42 mg/dL — ABNORMAL LOW (ref 0.44–1.00)
GFR calc Af Amer: >60 mL/min (ref >60)
GFR calc non Af Amer: >60 mL/min (ref >60)
Glucose, Bld: 122 mg/dL — ABNORMAL HIGH (ref 65–99)
Potassium: 3.6 mmol/L (ref 3.5–5.1)
Sodium: 140 mmol/L (ref 135–145)

## 2017-06-25 LAB — GLUCOSE, CAPILLARY
Glucose-Capillary: 54 mg/dL — ABNORMAL LOW (ref 65–99)
Glucose-Capillary: 104 mg/dL — ABNORMAL HIGH (ref 65–99)
Glucose-Capillary: 84 mg/dL (ref 65–99)
Glucose-Capillary: 190 mg/dL — ABNORMAL HIGH (ref 65–99)
Glucose-Capillary: 293 mg/dL — ABNORMAL HIGH (ref 65–99)
Glucose-Capillary: 90 mg/dL (ref 65–99)
Glucose-Capillary: 126 mg/dL — ABNORMAL HIGH (ref 65–99)

## 2017-06-25 LAB — MAGNESIUM: Magnesium: 1.9 mg/dL (ref 1.7–2.4)

## 2017-06-25 MED ORDER — POTASSIUM CHLORIDE CRYS ER 20 MEQ PO TBCR
40.0000 meq | EXTENDED_RELEASE_TABLET | Freq: Once | ORAL | Status: AC
  Administered 2017-06-25: 40 meq via ORAL

## 2017-06-25 MED ORDER — DEXTROSE 50 % IV SOLN
25.0000 mL | Freq: Once | INTRAVENOUS | Status: AC
  Administered 2017-06-25: 25 mL via INTRAVENOUS
  Filled 2017-06-25: qty 50

## 2017-06-25 MED ORDER — MAGNESIUM OXIDE 400 (241.3 MG) MG PO TABS
400.0000 mg | ORAL_TABLET | Freq: Three times a day (TID) | ORAL | Status: DC
  Administered 2017-06-25 – 2017-06-27 (×7): 400 mg via ORAL
  Filled 2017-06-25 (×7): qty 1

## 2017-06-25 NOTE — Progress Notes (Signed)
PROGRESS NOTE    ZAKARIAH DEJARNETTE  HTD:[id number] DOB: 11-07-1990 DOA: 06/23/2017 PCP: Girtha Rm, NP-C    Brief Narrative:  27 year old female who presented with abdominal pain.  She does have a significant past medical history for hypertension, type 1 diabetes mellitus with gastroparesis, and Crohn's disease.  Reported abdominal pain for the last 5 days prior to hospitalization, colic in nature, located in the epigastric and left lower quadrant, associated with diarrhea.  She was diagnosed with colitis per CT scan, she received oral antibiotic therapy for 4 days.  Her symptoms were persistent despite outpatient therapy, decreased oral intake, abdominal pain, nausea and vomiting.  Initial physical examination blood pressure 128/104, heart rate 109, respiratory 18, temperature 98.9, dry mucous membranes, lungs clear to auscultation bilaterally, heart S1-S2 present tachycardic, the abdomen was tender at the lower quadrants, no rebound, no guarding, no lower extremity edema.  Sodium 137, potassium 3.3, chloride 96, bicarb 29, glucose 286, BUN is 25, creatinine 0.56, lipase 22, white count 3.7, hemoglobin 9.1, hematocrit 31.6, platelets 192, urinalysis had specific gravity 1.012.  C. difficile antigen positive, c diff toxin negative. HCG less than 5.0.   Patient was admitted to the hospital working diagnosis of colitis, complicated by sepsis.   Assessment & Plan:   Principal Problem:   Colitis Active Problems:   Gastroparesis   Diabetes mellitus type 1 (HCC)   Nausea and vomiting   SIRS (systemic inflammatory response syndrome) (HCC)   Hypokalemia   Normocytic normochromic anemia   GERD (gastroesophageal reflux disease)   1.  Colitis complicated by sepsis. Continue IV antibiotic therapy with IV Zosyn, clinically has improved, with reduced pain, nausea and vomiting, will continue supportive medical therapy with IV fluids, IV antiacids, IV analgesics and as needed anti-emetics. Will  advance diet as tolerated. Continue sucralfate.   2.  Hypokalemia and hypomagnesemia. Serum K at 3,6, with serum cr at 0.42, continue k correction with kcl, follow on renal panel in am. Will start on Mag oxide po tid.   3.  Type 2 diabetes mellitus. Capillary glucose 183, 54, 104, 190. Will hold on basal insulin ( 5 units) for now, will advance diet and will continue glucose cover monitoring with insulin sliding scale. Improved GI symptoms.    4.  Depression. On citalopram.   5. HTN. Continue as needed hydralazine.    DVT prophylaxis: enoxaparin   Code Status:  full Family Communication:  No family at the bedside Disposition Plan: home when clinically improved   Consultants:     Procedures:     Antimicrobials:       Subjective: Patient is feeling better, tolerating clear liquid diet, intermittent nausea and vomiting, improved abdominal pain.   Objective: Vitals:   06/23/17 2225 06/24/17 0450 06/24/17 2200 06/25/17 0500  BP: (!) 162/110 122/72 (!) 158/104 (!) 150/99  Pulse: (!) 111 (!) 105 (!) 110 (!) 101  Resp: 17 18 17 16   Temp: 98.1 F (36.7 C) 98.1 F (36.7 C) 98.3 F (36.8 C) 98.1 F (36.7 C)  TempSrc: Oral Oral Oral Oral  SpO2: 100% 100% 100% 100%  Weight:      Height:        Intake/Output Summary (Last 24 hours) at 06/25/2017 1221 Last data filed at 06/25/2017 1000 Gross per 24 hour  Intake 2055 ml  Output 1150 ml  Net 905 ml   Filed Weights   06/23/17 1106  Weight: 59 kg (130 lb)    Examination:   General: Not in  pain or dyspnea, deconditioned Neurology: Awake and alert, non focal  E ENT: mild pallor, no icterus, oral mucosa moist Cardiovascular: No JVD. S1-S2 present, rhythmic, no gallops, rubs, or murmurs. No lower extremity edema. Pulmonary: vesicular breath sounds bilaterally, adequate air movement, no wheezing, rhonchi or rales. Gastrointestinal. Abdomen with no organomegaly, no rebound or guarding, not tender to superficial  palpation.  Skin. No rashes Musculoskeletal: no joint deformities     Data Reviewed: I have personally reviewed following labs and imaging studies  CBC: Recent Labs  Lab 06/19/17 1210 06/21/17 1404 06/23/17 1225 06/24/17 0449  WBC 4.4 3.9* 3.7* 4.2  HGB 11.0* 10.9* 11.1* 10.2*  HCT 32.1* 31.1* 31.6* 29.4*  MCV 82.9 81.6 80.6 82.8  PLT 216 187 192 076   Basic Metabolic Panel: Recent Labs  Lab 06/19/17 1210 06/21/17 1404 06/23/17 1225 06/24/17 0449 06/25/17 0434  NA 139 136 137 137 140  K 3.4* 3.3* 3.3* 3.1* 3.6  CL 100* 96* 96* 102 104  CO2 29 26 29 25 28   GLUCOSE 201* 169* 286* 124* 122*  BUN 5* <5* <5* <5* <5*  CREATININE 0.48 0.60 0.56 0.45 0.42*  CALCIUM 9.2 9.0 9.1 8.6* 8.9  MG  --   --   --  1.7 1.9   GFR: Estimated Creatinine Clearance: 98.4 mL/min (A) (by C-G formula based on SCr of 0.42 mg/dL (L)). Liver Function Tests: Recent Labs  Lab 06/19/17 1210 06/21/17 1404 06/23/17 1225  AST 17 20 16   ALT 16 14 11*  ALKPHOS 83 80 81  BILITOT 0.7 0.8 0.6  PROT 7.4 7.4 7.5  ALBUMIN 3.5 3.3* 3.7   Recent Labs  Lab 06/19/17 1210 06/21/17 1404 06/23/17 1225  LIPASE 20 20 22    No results for input(s): AMMONIA in the last 168 hours. Coagulation Profile: No results for input(s): INR, PROTIME in the last 168 hours. Cardiac Enzymes: No results for input(s): CKTOTAL, CKMB, CKMBINDEX, TROPONINI in the last 168 hours. BNP (last 3 results) No results for input(s): PROBNP in the last 8760 hours. HbA1C: No results for input(s): HGBA1C in the last 72 hours. CBG: Recent Labs  Lab 06/24/17 2331 06/25/17 0402 06/25/17 0448 06/25/17 0708 06/25/17 1139  GLUCAP 183* 54* 104* 84 190*   Lipid Profile: No results for input(s): CHOL, HDL, LDLCALC, TRIG, CHOLHDL, LDLDIRECT in the last 72 hours. Thyroid Function Tests: No results for input(s): TSH, T4TOTAL, FREET4, T3FREE, THYROIDAB in the last 72 hours. Anemia Panel: No results for input(s): VITAMINB12,  FOLATE, FERRITIN, TIBC, IRON, RETICCTPCT in the last 72 hours.    Radiology Studies: I have reviewed all of the imaging during this hospital visit personally     Scheduled Meds: . citalopram  20 mg Oral Daily  . diphenoxylate-atropine  2 tablet Oral QID  . enoxaparin (LOVENOX) injection  40 mg Subcutaneous Q24H  . ferrous sulfate  325 mg Oral BID WC  . folic acid  1 mg Oral Daily  . gabapentin  300 mg Oral BID  . insulin aspart  0-9 Units Subcutaneous Q4H  . insulin glargine  5 Units Subcutaneous Daily  . lipase/protease/amylase  36,000 Units Oral TID WC  . multivitamin with minerals  1 tablet Oral Daily  . pantoprazole (PROTONIX) IV  40 mg Intravenous Q12H  . potassium chloride  40 mEq Oral Once  . sucralfate  1 g Oral TID WC & HS   Continuous Infusions: . lactated ringers 75 mL/hr at 06/25/17 1114  . piperacillin-tazobactam (ZOSYN)  IV 3.375 g (  06/25/17 1000)     LOS: 1 day        Deema Juncaj Gerome Apley, MD [facility] Pager [phone]

## 2017-06-25 NOTE — Progress Notes (Signed)
Hypoglycemic Event  CBG: 54  Treatment:D50  Symptoms:Asypmtomatic  Follow-up CBG: Time:0445 CBG Result:104  Possible Reasons for Event:NPO after patient experienced episodes of emesis. Comments/MD notified:K. Dewayne Shorter, Eulah Citizen

## 2017-06-26 ENCOUNTER — Encounter (HOSPITAL_COMMUNITY): Payer: Self-pay | Admitting: Physician Assistant

## 2017-06-26 DIAGNOSIS — R1084 Generalized abdominal pain: Secondary | ICD-10-CM

## 2017-06-26 DIAGNOSIS — R197 Diarrhea, unspecified: Secondary | ICD-10-CM

## 2017-06-26 DIAGNOSIS — R112 Nausea with vomiting, unspecified: Secondary | ICD-10-CM

## 2017-06-26 DIAGNOSIS — R933 Abnormal findings on diagnostic imaging of other parts of digestive tract: Secondary | ICD-10-CM

## 2017-06-26 LAB — GLUCOSE, CAPILLARY
Glucose-Capillary: 137 mg/dL — ABNORMAL HIGH (ref 65–99)
Glucose-Capillary: 86 mg/dL (ref 65–99)
Glucose-Capillary: 140 mg/dL — ABNORMAL HIGH (ref 65–99)
Glucose-Capillary: 205 mg/dL — ABNORMAL HIGH (ref 65–99)
Glucose-Capillary: 105 mg/dL — ABNORMAL HIGH (ref 65–99)
Glucose-Capillary: 48 mg/dL — ABNORMAL LOW (ref 65–99)
Glucose-Capillary: 64 mg/dL — ABNORMAL LOW (ref 65–99)
Glucose-Capillary: 76 mg/dL (ref 65–99)

## 2017-06-26 LAB — CBC WITH DIFFERENTIAL/PLATELET
Basophils Absolute: 0 10*3/uL (ref 0.0–0.1)
Basophils Relative: 0 %
Eosinophils Absolute: 0.1 10*3/uL (ref 0.0–0.7)
Eosinophils Relative: 3 %
HCT: 27.5 % — ABNORMAL LOW (ref 36.0–46.0)
Hemoglobin: 8.9 g/dL — ABNORMAL LOW (ref 12.0–15.0)
Lymphocytes Relative: 61 %
Lymphs Abs: 2.9 10*3/uL (ref 0.7–4.0)
MCH: 27.2 pg (ref 26.0–34.0)
MCHC: 32.4 g/dL (ref 30.0–36.0)
MCV: 84.1 fL (ref 78.0–100.0)
Monocytes Absolute: 0.4 10*3/uL (ref 0.1–1.0)
Monocytes Relative: 7 %
Neutro Abs: 1.4 10*3/uL — ABNORMAL LOW (ref 1.7–7.7)
Neutrophils Relative %: 29 %
Platelets: 177 10*3/uL (ref 150–400)
RBC: 3.27 MIL/uL — ABNORMAL LOW (ref 3.87–5.11)
RDW: 13 % (ref 11.5–15.5)
WBC: 4.8 10*3/uL (ref 4.0–10.5)

## 2017-06-26 LAB — BASIC METABOLIC PANEL
Anion gap: 6 (ref 5–15)
BUN: <5 mg/dL — ABNORMAL LOW (ref 6–20)
CO2: 29 mmol/L (ref 22–32)
Calcium: 8.6 mg/dL — ABNORMAL LOW (ref 8.9–10.3)
Chloride: 105 mmol/L (ref 101–111)
Creatinine, Ser: 0.5 mg/dL (ref 0.44–1.00)
GFR calc Af Amer: >60 mL/min (ref >60)
GFR calc non Af Amer: >60 mL/min (ref >60)
Glucose, Bld: 128 mg/dL — ABNORMAL HIGH (ref 65–99)
Potassium: 3.5 mmol/L (ref 3.5–5.1)
Sodium: 140 mmol/L (ref 135–145)

## 2017-06-26 MED ORDER — PEG-KCL-NACL-NASULF-NA ASC-C 100 G PO SOLR
0.5000 | Freq: Once | ORAL | Status: AC
  Administered 2017-06-26: 100 g via ORAL

## 2017-06-26 MED ORDER — ENOXAPARIN SODIUM 40 MG/0.4ML ~~LOC~~ SOLN: 40.0000 mg | SUBCUTANEOUS | Status: DC

## 2017-06-26 MED ORDER — DEXTROSE IN LACTATED RINGERS 5 % IV SOLN
INTRAVENOUS | Status: DC
  Administered 2017-06-26: 23:00:00 via INTRAVENOUS

## 2017-06-26 MED ORDER — PEG-KCL-NACL-NASULF-NA ASC-C 100 G PO SOLR: 1.0000 | Freq: Once | ORAL | Status: DC

## 2017-06-26 MED ORDER — PEG-KCL-NACL-NASULF-NA ASC-C 100 G PO SOLR
0.5000 | Freq: Once | ORAL | Status: AC
  Administered 2017-06-26: 100 g via ORAL
  Filled 2017-06-26: qty 1

## 2017-06-26 NOTE — H&P (View-Only) (Signed)
Consultation  Referring Provider: Dr. Carolin Sicks    Primary Care Physician:  Girtha Rm, NP-C Primary Gastroenterologist: Althia Forts (previously [name], recently discharged from their practice)        Reason for Consultation: Nausea, vomiting and abdominal pain             HPI:   [name] is a 27 y.o. female with a history of poorly controlled diabetes, perirectal abscess, gluteal abscess and gastroparesis (reported history of Crohn's has been removed from diagnosis list as there is no evidence of this), who presented to the ER on 06/23/17 with a complaint of lower abdominal pain.   (The patient has had most multiple hospital stays for similar complaints in the past and was previously followed by Dr. Benson [name] until she was discharged from their clinic recently.)    Please see consult note by Dr. Benson [name] during patient's last admission 04/10/17.  Apparently had prolonged admission during the first half of December when she presented with severely malnourished state.  Albumin was low and had anasarca as well as ascites.  Empirically treated for C. difficile but did not improve her situation.  Creon was attempted but no improvement.  Scheduled Imodium and Lomotil were tried which helped.  The most benefit was achieved Cholestyramine regimen.  After last admission was recommended to stay on a rigid cholestyramine regimen.  It was discussed that it is unknown where her history of "Crohn's" had come from as there is no evidence of inflammation with biopsies, imaging or inflammatory markers.     Today, patient explains that she developed severe epigastric cramping on Thursday, 06/18/17 which turned into diarrhea which was uncontrollable the next day.  Eating made her pain worse which seemed to radiate into her left side and she went to the ER.  Was told she had colitis and started on antibiotics but her symptoms worsened over the weekend and she represented to the ER on 06/23/17.    Since admission  patient has remained nauseous and tells me she is only able to tolerate a liquid diet.  Her abdominal pain is getting better but "still there", rated as a 6-7/10.  Her stooling has decreased, now 2-3 stools a day as opposed to 5-6 at admission, but these are still very urgent and sometimes "I will stool in my sleep".  Patient has also been vomiting since admission 4-5 times just yesterday.      Patient describes better control of her diabetes since December.    Patient aware she has been discharged from Dr. Ulyses Amor clinic but has not made efforts to establish care elsewhere.    Denies fever, chills, blood in her stool or melena.  ED Course: Upon admission into the emergency department patient was noted to be afebrile, pulse 109-114, respirations 17-18, blood pressure 128/104-181/120, and O2 saturations maintained on room air.  Labs revealed WBC 3.7, hemoglobin 11.1, potassium 3.3, glucose 286.  Patient was given Bentyl, 4 mg of morphine, and 1 L of normal saline IV fluid. TRH called to admit.  CT abdomen pelvis 06/19/17 showing mild wall thickening throughout the colon likely mild acute colitis.  Changes of the subcutaneous region of the right gluteal region with surgical drain without significant change.  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  Minimal left base opacification likely atelectasis.  Previous GI workup: 03/10/17-flex sigmoidoscopy, Dr. Benson [name]: Entire examined colon was normal.  Biopsies negative/normal. 12/05/11-gastric emptying study:  Gastric emptying delayed 12/03/11-EGD, Dr. Benson [name]: Stomach filled with food contents  Past Medical History:  Diagnosis Date  . Anxiety   . Diabetes type 1, uncontrolled ([place]) 11/14/2011   Since age 16  . Fibromyalgia   . Gastroparesis   . Hypertension   . Infection    UTI April 2016    Past Surgical History:  Procedure Laterality Date  . ANKLE SURGERY    . CHOLECYSTECTOMY   11/15/2011   Procedure: LAPAROSCOPIC CHOLECYSTECTOMY WITH INTRAOPERATIVE CHOLANGIOGRAM;  Surgeon: [name], MD;  Location: [facility];  Service: General;  Laterality: N/A;  . ESOPHAGOGASTRODUODENOSCOPY  12/03/2011   Procedure: ESOPHAGOGASTRODUODENOSCOPY (EGD);  Surgeon: Beryle Beams, MD;  Location: [facility];  Service: Endoscopy;  Laterality: N/A;  . FLEXIBLE SIGMOIDOSCOPY N/A 03/10/2017   Procedure: FLEXIBLE SIGMOIDOSCOPY;  Surgeon: Carol Ada, MD;  Location: [facility];  Service: Endoscopy;  Laterality: N/A;  . INCISION AND DRAINAGE PERIRECTAL ABSCESS N/A 03/01/2017   Procedure: IRRIGATION AND DEBRIDEMENT PERIRECTAL ABSCESS;  Surgeon: Alphonsa Overall, MD;  Location: [facility];  Service: General;  Laterality: N/A;  . IRRIGATION AND DEBRIDEMENT BUTTOCKS N/A 03/23/2017   Procedure: IRRIGATION AND DEBRIDEMENT BUTTOCKS, SETON PLACEMENT;  Surgeon: [name], MD;  Location: [facility];  Service: General;  Laterality: N/A;  . LAPAROSCOPY  11/23/2011   Procedure: LAPAROSCOPY DIAGNOSTIC;  Surgeon: Edward Jolly, MD;  Location: [facility];  Service: General;  Laterality: N/A;    Family History  Problem Relation Age of Onset  . Diabetes Mother   . Hypertension Father      Social History   Tobacco Use  . Smoking status: Never Smoker  . Smokeless tobacco: Never Used  Substance Use Topics  . Alcohol use: No    Alcohol/week: 0.0 oz    Frequency: Never  . Drug use: No    Prior to Admission medications   Medication Sig Start Date End Date Taking? Authorizing Provider  acetaminophen (TYLENOL) 500 MG tablet Take 1,000 mg by mouth daily as needed for moderate pain.    Yes [provider]  alum & mag hydroxide-simeth (MAALOX ADVANCED MAX ST) 130-865-78 MG/5ML suspension Take 10 mLs by mouth every 6 (six) hours as needed for indigestion. 05/31/17  Yes Cardama, Grayce Sessions, MD  cholestyramine light (PREVALITE) 4 g packet Take 1 packet (4 g total) by mouth 3 (three) times daily. Patient  taking differently: Take 4 g by mouth 2 (two) times daily as needed (diarrhea).  03/14/17  Yes Lavina Hamman, MD  ciprofloxacin (CIPRO) 500 MG tablet Take 1 tablet (500 mg total) by mouth 2 (two) times daily. 06/19/17  Yes [name], MD  citalopram (CELEXA) 20 MG tablet Take 1 tablet (20 mg total) by mouth daily. 05/13/17  Yes Henson, Vickie L, NP-C  cyclobenzaprine (FLEXERIL) 10 MG tablet Take 1 tablet (10 mg total) by mouth 2 (two) times daily as needed for muscle spasms. 05/21/17  Yes Khatri, Hina, PA-C  diphenoxylate-atropine (LOMOTIL) 2.5-0.025 MG tablet Take 2 tablets by mouth 4 (four) times daily. 05/18/17  Yes Henson, Vickie L, NP-C  famotidine (PEPCID) 40 MG tablet Take 1 tablet (40 mg total) by mouth 2 (two) times daily. 06/21/17  Yes Recardo Evangelist, PA-C  ferrous sulfate 325 (65 FE) MG tablet Take 1 tablet (325 mg total) by mouth 2 (two) times daily with a meal. 05/13/17  Yes Henson, Vickie L, NP-C  folic acid (FOLVITE) 1 MG tablet Take 1 tablet (1 mg total) by mouth daily. 05/13/17  Yes Henson, Vickie L, NP-C  gabapentin (NEURONTIN) 300 MG capsule Take 1 capsule (300 mg total) by mouth 2 (two) times daily. 04/03/17  Yes Henson, Vickie L, NP-C  insulin lispro (HUMALOG) 100 UNIT/ML injection Inject 0.02 mLs (2 Units total) into the skin 3 (three) times daily with meals. Patient taking differently: Inject 0-9 Units into the skin 3 (three) times daily with meals. Sliding scale 04/09/17  Yes Renato Shin, MD  lidocaine (LIDODERM) 5 % Place 1 patch onto the skin daily. Remove & Discard patch within 12 hours or as directed by MD Patient taking differently: Place 1 patch onto the skin daily as needed (pain). Remove & Discard patch within 12 hours or as directed by MD 05/21/17  Yes Khatri, Hina, PA-C  lidocaine (XYLOCAINE) 2 % solution Use as directed 15 mLs in the mouth or throat every 6 (six) hours as needed (stomach pain). 05/31/17  Yes Cardama, Grayce Sessions, MD  lipase/protease/amylase  (CREON) 36000 UNITS CPEP capsule Take 1 capsule (36,000 Units total) by mouth 3 (three) times daily with meals. 03/14/17  Yes Lavina Hamman, MD  loperamide (IMODIUM) 2 MG capsule Take 1 capsule (2 mg total) by mouth 3 (three) times daily before meals. DO NOT TAKE IT FOR THE DAY YOU DONT HAVE A BOWEL MOVEMENT. Patient taking differently: Take 2 mg by mouth 3 (three) times daily as needed for diarrhea or loose stools.  04/03/17  Yes Henson, Vickie L, NP-C  metoCLOPramide (REGLAN) 5 MG tablet Take 5 mg by mouth every 6 (six) hours as needed for nausea or vomiting.    Yes [provider]  metroNIDAZOLE (FLAGYL) 500 MG tablet Take 1 tablet (500 mg total) by mouth 2 (two) times daily. 06/19/17  Yes [name], MD  oxyCODONE-acetaminophen (PERCOCET/ROXICET) 5-325 MG tablet Take 1 tablet by mouth every 6 (six) hours as needed for severe pain. 06/20/17  Yes Petrucelli, Samantha R, PA-C  promethazine (PHENERGAN) 25 MG tablet Take 1 tablet (25 mg total) by mouth every 6 (six) hours as needed for nausea or vomiting. 06/20/17  Yes Petrucelli, Samantha R, PA-C  sucralfate (CARAFATE) 1 GM/10ML suspension Take 10 mLs (1 g total) by mouth 4 (four) times daily -  with meals and at bedtime. 06/17/17  Yes Upstill, Nehemiah Settle, PA-C  EPINEPHrine (EPIPEN 2-PAK) 0.3 mg/0.3 mL IJ SOAJ injection Inject 0.3 mLs (0.3 mg total) into the muscle as needed (allergic reaction). Patient taking differently: Inject 0.3 mg into the muscle once as needed (severe allergic reaction).  03/21/17   Aline August, MD  furosemide (LASIX) 40 MG tablet Take 40 mg by mouth daily as needed for fluid.     [provider]  glucose 4 GM chewable tablet Chew 1 tablet (4 g total) by mouth as needed for low blood sugar. Patient taking differently: Chew 1 tablet by mouth once as needed for low blood sugar.  08/10/14   Kandis Cocking A, CNM  LANTUS 100 UNIT/ML injection Inject 0.18 mLs (18 Units total) into the skin every morning. Patient  taking differently: Inject 18 Units into the skin daily before breakfast.  05/07/17   Renato Shin, MD    Current Facility-Administered Medications  Medication Dose Route Frequency Provider Last Rate Last Dose  . acetaminophen (TYLENOL) tablet 650 mg  650 mg Oral Q6H PRN Fuller Plan A, MD       Or  . acetaminophen (TYLENOL) suppository 650 mg  650 mg Rectal Q6H PRN Norval Morton, MD      .  cholestyramine light (PREVALITE) packet 4 g  4 g Oral BID PRN Fuller Plan A, MD      . citalopram (CELEXA) tablet 20 mg  20 mg Oral Daily Smith, Rondell A, MD   20 mg at 06/26/17 1055  . cyclobenzaprine (FLEXERIL) tablet 10 mg  10 mg Oral BID PRN Fuller Plan A, MD      . diphenoxylate-atropine (LOMOTIL) 2.5-0.025 MG per tablet 2 tablet  2 tablet Oral QID Fuller Plan A, MD   2 tablet at 06/26/17 1055  . enoxaparin (LOVENOX) injection 40 mg  40 mg Subcutaneous Q24H Fuller Plan A, MD   40 mg at 06/25/17 2127  . ferrous sulfate tablet 325 mg  325 mg Oral BID WC Tamala Julian, Rondell A, MD   325 mg at 06/24/17 0807  . folic acid (FOLVITE) tablet 1 mg  1 mg Oral Daily Smith, Rondell A, MD   1 mg at 06/26/17 1055  . gabapentin (NEURONTIN) capsule 300 mg  300 mg Oral BID Fuller Plan A, MD   300 mg at 06/26/17 1055  . hydrALAZINE (APRESOLINE) injection 10 mg  10 mg Intravenous Q4H PRN Fuller Plan A, MD   10 mg at 06/23/17 2302  . HYDROmorphone (DILAUDID) injection 1 mg  1 mg Intravenous Q4H PRN Arrien, Jimmy Picket, MD   1 mg at 06/26/17 0732  . insulin aspart (novoLOG) injection 0-9 Units  0-9 Units Subcutaneous Q4H Fuller Plan A, MD   1 Units at 06/26/17 0341  . lactated ringers infusion   Intravenous Continuous Tawni Millers, MD 50 mL/hr at 06/25/17 2135    . lidocaine (LIDODERM) 5 % 1 patch  1 patch Transdermal Daily PRN Smith, Rondell A, MD      . lidocaine (XYLOCAINE) 2 % viscous mouth solution 15 mL  15 mL Mouth/Throat Q6H PRN Smith, Rondell A, MD      . lipase/protease/amylase  (CREON) capsule 36,000 Units  36,000 Units Oral TID WC Norval Morton, MD   36,000 Units at 06/25/17 1702  . magnesium oxide (MAG-OX) tablet 400 mg  400 mg Oral TID Tawni Millers, MD   400 mg at 06/26/17 1055  . multivitamin with minerals tablet 1 tablet  1 tablet Oral Daily Arrien, Jimmy Picket, MD   1 tablet at 06/26/17 1055  . ondansetron (ZOFRAN) tablet 4 mg  4 mg Oral Q6H PRN Fuller Plan A, MD       Or  . ondansetron (ZOFRAN) injection 4 mg  4 mg Intravenous Q6H PRN Fuller Plan A, MD   4 mg at 06/26/17 0534  . pantoprazole (PROTONIX) injection 40 mg  40 mg Intravenous Q12H Arrien, Jimmy Picket, MD   40 mg at 06/26/17 1054  . piperacillin-tazobactam (ZOSYN) IVPB 3.375 g  3.375 g Intravenous Q8H Smith, Rondell A, MD 12.5 mL/hr at 06/26/17 0752 3.375 g at 06/26/17 0752  . potassium chloride SA (K-DUR,KLOR-CON) CR tablet 40 mEq  40 mEq Oral Once Arrien, Jimmy Picket, MD      . promethazine (PHENERGAN) injection 12.5 mg  12.5 mg Intravenous Q6H PRN Arrien, Jimmy Picket, MD   12.5 mg at 06/26/17 0750  . sucralfate (CARAFATE) 1 GM/10ML suspension 1 g  1 g Oral TID WC & HS Tamala Julian, Rondell A, MD   1 g at 06/26/17 0754    Allergies as of 06/23/2017 - Review Complete 06/23/2017  Allergen Reaction Noted  . Other Anaphylaxis 12/13/2011  . Lactose intolerance (gi) Diarrhea 01/22/2015     Review  of Systems:    Constitutional: No weight loss, fever or chills Skin: No rash  Cardiovascular: No chest pain Respiratory: No SOB  Gastrointestinal: See HPI and otherwise negative Genitourinary: No dysuria Neurological: No headache Musculoskeletal: No new muscle or joint pain Hematologic: No bleeding Psychiatric: No history of depression or anxiety   Physical Exam:  Vital signs in last 24 hours: Temp:  [97.8 F (36.6 C)-98.4 F (36.9 C)] 97.9 F (36.6 C) (03/29 0606) Pulse Rate:  [97-107] 97 (03/29 0606) Resp:  [16] 16 (03/29 0606) BP: (115-129)/(81-92) 115/81 (03/29  0606) SpO2:  [97 %-100 %] 98 % (03/29 0606) Last BM Date: 06/25/17 General:   AA female appears to be in NAD, Well developed, Well nourished, alert and cooperative Head:  Normocephalic and atraumatic. Eyes:   PEERL, EOMI. No icterus. Conjunctiva pink. Ears:  Normal auditory acuity. Neck:  Supple Throat: Oral cavity and pharynx without inflammation, swelling or lesion.  Lungs: Respirations even and unlabored. Lungs clear to auscultation bilaterally.   No wheezes, crackles, or rhonchi.  Heart: Normal S1, S2. No MRG. Regular rate and rhythm. No peripheral edema, cyanosis or pallor.  Abdomen:  Soft, nondistended, moderate b/l lower abdominal ttp, No rebound or guarding. Normal bowel sounds. No appreciable masses or hepatomegaly. Rectal:  Not performed.  Msk:  Symmetrical without gross deformities.  Extremities:  Without edema, no deformity or joint abnormality.  Neurologic:  Alert and  oriented x4;  grossly normal neurologically.  Skin:   Dry and intact without significant lesions or rashes. Psychiatric:  Demonstrates good judgement and reason without abnormal affect or behaviors.   LAB RESULTS: Recent Labs    06/23/17 1225 06/24/17 0449 06/26/17 0419  WBC 3.7* 4.2 4.8  HGB 11.1* 10.2* 8.9*  HCT 31.6* 29.4* 27.5*  PLT 192 184 177   BMET Recent Labs    06/24/17 0449 06/25/17 0434 06/26/17 0419  NA 137 140 140  K 3.1* 3.6 3.5  CL 102 104 105  CO2 25 28 29   GLUCOSE 124* 122* 128*  BUN <5* <5* <5*  CREATININE 0.45 0.42* 0.50  CALCIUM 8.6* 8.9 8.6*   LFT Recent Labs    06/23/17 1225  PROT 7.5  ALBUMIN 3.7  AST 16  ALT 11*  ALKPHOS 81  BILITOT 0.6   EXAM: CT ABDOMEN AND PELVIS WITH CONTRAST 06/19/17  TECHNIQUE: Multidetector CT imaging of the abdomen and pelvis was performed using the standard protocol following bolus administration of intravenous contrast.  CONTRAST:  166m ISOVUE-300 IOPAMIDOL (ISOVUE-300) INJECTION 61%  COMPARISON:  05/27/2017 and  01/15/2016 as well as 04/10/2017  FINDINGS: Lower chest: Lung bases demonstrate minimal opacification of the left lower lobe likely atelectasis. No effusion.  Hepatobiliary: Previous cholecystectomy. Liver and biliary tree are normal.  Pancreas: Normal.  Spleen: Normal.  Adrenals/Urinary Tract: Adrenal glands are normal. Kidneys are normal in size without hydronephrosis or nephrolithiasis. Ureters and bladder are unremarkable.  Stomach/Bowel: Stomach is normal. Small bowel is within normal. Appendix is normal. There is minimal wall thickening throughout the colon likely due to mild colitis associated due to patient's Crohn's disease. No free fluid or free peritoneal air.  Vascular/Lymphatic: Within normal.  Reproductive: Within normal.  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  Musculoskeletal: Within normal.  IMPRESSION: Mild wall thickening throughout the colon likely mild acute colitis due to patient's Crohn's disease.  Changes over the subcutaneous region of the right gluteal  region with surgical drain without significant change. 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  Minimal left base opacification likely atelectasis. Early infection is possible.   Electronically Signed   By: Marin Olp M.D.   On: 06/19/2017 14:30   Impression / Plan:   Impression: 1.  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  Nausea and vomiting: Patient with known  history of gastroparesis  Plan: 1.  Continue antibiotics for now. 2.  Will arrange for colonoscopy tomorrow with Dr. Ardis Hughs. Did discuss risk, benefits, limitations and alternatives and the patient agrees to proceed. 3.  Patient will be on clears today and n.p.o. after midnight 4.  Movi prep has been ordered.  Hold antidiarrheals during prep. 5.  Please await further recommendations from Dr. Ardis Hughs later today.  Thank you for your kind consultation, we will continue to follow.  Lavone Nian Lemmon  06/26/2017, 11:18 AM Pager #: [phone]   ________________________________________________________________________  Velora Heckler GI MD note:  I personally examined the patient, reviewed the data and agree with the assessment and plan described above.  I'm really not sure if she has colitis, the CT reading by radiologist is pretty underwhelming and was based on false information that she had Crohn's disease. Her bowels have not been normal for at least a year certainly at colonoscopy is warranted at this point to check for IBD, infection, attempt to understand her pains.  Planning colonoscopy tomorrow.    Owens Loffler, MD [facility] Pager [phone]

## 2017-06-26 NOTE — Progress Notes (Signed)
PROGRESS NOTE    [name]  OIB:[id number] DOB: [birth year] DOA: 06/23/2017 PCP: Girtha Rm, NP-C   Brief Narrative: 27 year old female with history of Crohn's disease, diabetes, gastroparesis presented with nausea vomiting abdominal pain and diarrhea.  CT scan with colitis.  Failed outpatient oral antibiotics.  Started on IV antibiotics. GI consult today.  Assessment & Plan:   #Acute colitis: Patient with no improvement in nausea vomiting abdominal pain and diarrhea.  Currently on IV Zosyn, Lomotil and Creon.  Also on Protonix.  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  Given history of Crohn's disease and not improvement in symptoms with antibiotics, I consulted GI today. -Has C. difficile antigen positive without toxin and  this is chronic. -Continue Phenergan, Zofran as needed. -On full liquid diet currently.  #Type 2 diabetes: Blood sugar acceptable.  Patient does not have a good appetite.  Continue sliding scale.  #Anxiety depression: Continue Celexa and supportive care.  #Hypokalemia: Potassium level improved.  Monitor labs.  DVT prophylaxis: Lovenox subcutaneous Code Status: Full code Family Communication: No family at bedside Disposition Plan: Likely discharge home in 1-2 days    Consultants:   GI  Procedures: None Antimicrobials: IV Zosyn  Subjective: Seen and examined at bedside.  Reported no improvement in nausea vomiting abdominal pain.  Diarrhea is still present.  Denied chest pain or shortness of breath.  Objective: Vitals:   06/25/17 0500 06/25/17 1320 06/25/17 2019 06/26/17 0606  BP: (!) 150/99 (!) 129/92 115/84 115/81  Pulse: (!) 101 (!) 107 97 97  Resp: 16 16 16 16   Temp: 98.1 F (36.7 C) 98.4 F (36.9 C) 97.8 F (36.6 C) 97.9 F (36.6 C)  TempSrc: Oral Oral Oral Oral  SpO2: 100% 100% 97% 98%  Weight:      Height:        Intake/Output Summary (Last 24 hours) at 06/26/2017 1057 Last  data filed at 06/26/2017 0200 Gross per 24 hour  Intake 940 ml  Output -  Net 940 ml   Filed Weights   06/23/17 1106  Weight: 59 kg (130 lb)    Examination:  General exam: Appears calm and comfortable  Respiratory system: Clear to auscultation. Respiratory effort normal. No wheezing or crackle Cardiovascular system: S1 & S2 heard, RRR.  No pedal edema. Gastrointestinal system: Diffuse abdomen tenderness.  Bowel sounds positive.  Soft.  Not distended Central nervous system: Alert and oriented. No focal neurological deficits. Extremities: Symmetric 5 x 5 power. Skin: No rashes, lesions or ulcers Psychiatry: Judgement and insight appear normal. Mood & affect appropriate.     Data Reviewed: I have personally reviewed following labs and imaging studies  CBC: Recent Labs  Lab 06/19/17 1210 06/21/17 1404 06/23/17 1225 06/24/17 0449 06/26/17 0419  WBC 4.4 3.9* 3.7* 4.2 4.8  NEUTROABS  --   --   --   --  1.4*  HGB 11.0* 10.9* 11.1* 10.2* 8.9*  HCT 32.1* 31.1* 31.6* 29.4* 27.5*  MCV 82.9 81.6 80.6 82.8 84.1  PLT 216 187 192 184 945   Basic Metabolic Panel: Recent Labs  Lab 06/21/17 1404 06/23/17 1225 06/24/17 0449 06/25/17 0434 06/26/17 0419  NA 136 137 137 140 140  K 3.3* 3.3* 3.1* 3.6 3.5  CL 96* 96* 102 104 105  CO2 26 29 25 28 29   GLUCOSE 169* 286* 124* 122* 128*  BUN <5* <5* <5* <5* <5*  CREATININE 0.60 0.56 0.45 0.42* 0.50  CALCIUM 9.0 9.1  8.6* 8.9 8.6*  MG  --   --  1.7 1.9  --    GFR: Estimated Creatinine Clearance: 98.4 mL/min (by C-G formula based on SCr of 0.5 mg/dL). Liver Function Tests: Recent Labs  Lab 06/19/17 1210 06/21/17 1404 06/23/17 1225  AST 17 20 16   ALT 16 14 11*  ALKPHOS 83 80 81  BILITOT 0.7 0.8 0.6  PROT 7.4 7.4 7.5  ALBUMIN 3.5 3.3* 3.7   Recent Labs  Lab 06/19/17 1210 06/21/17 1404 06/23/17 1225  LIPASE 20 20 22    No results for input(s): AMMONIA in the last 168 hours. Coagulation Profile: No results for input(s):  INR, PROTIME in the last 168 hours. Cardiac Enzymes: No results for input(s): CKTOTAL, CKMB, CKMBINDEX, TROPONINI in the last 168 hours. BNP (last 3 results) No results for input(s): PROBNP in the last 8760 hours. HbA1C: No results for input(s): HGBA1C in the last 72 hours. CBG: Recent Labs  Lab 06/25/17 1651 06/25/17 2017 06/25/17 2325 06/26/17 0335 06/26/17 0728  GLUCAP 293* 90 126* 137* 86   Lipid Profile: No results for input(s): CHOL, HDL, LDLCALC, TRIG, CHOLHDL, LDLDIRECT in the last 72 hours. Thyroid Function Tests: No results for input(s): TSH, T4TOTAL, FREET4, T3FREE, THYROIDAB in the last 72 hours. Anemia Panel: No results for input(s): VITAMINB12, FOLATE, FERRITIN, TIBC, IRON, RETICCTPCT in the last 72 hours. Sepsis Labs: Recent Labs  Lab 06/23/17 2243  LATICACIDVEN 0.8    Recent Results (from the past 240 hour(s))  C difficile quick scan w PCR reflex     Status: Abnormal   Collection Time: 06/24/17  3:45 AM  Result Value Ref Range Status   C Diff antigen POSITIVE (A) NEGATIVE Final   C Diff toxin NEGATIVE NEGATIVE Final   C Diff interpretation Results are indeterminate. See PCR results.  Final    Comment: Performed at [facility], [place] [address] [place], [place] [address]  C. Diff by PCR, Reflexed     Status: None   Collection Time: 06/24/17  3:45 AM  Result Value Ref Range Status   Toxigenic C. Difficile by PCR NEGATIVE NEGATIVE Final    Comment: Patient is colonized with non toxigenic C. difficile. May not need treatment unless significant symptoms are present. Performed at [facility], [place] [address] [place], [place] [address]          Radiology Studies: No results found.      Scheduled Meds: . citalopram  20 mg Oral Daily  . diphenoxylate-atropine  2 tablet Oral QID  . enoxaparin (LOVENOX) injection  40 mg Subcutaneous Q24H  . ferrous sulfate  325 mg Oral BID WC  . folic acid  1 mg Oral Daily  . gabapentin   300 mg Oral BID  . insulin aspart  0-9 Units Subcutaneous Q4H  . lipase/protease/amylase  36,000 Units Oral TID WC  . magnesium oxide  400 mg Oral TID  . multivitamin with minerals  1 tablet Oral Daily  . pantoprazole (PROTONIX) IV  40 mg Intravenous Q12H  . potassium chloride  40 mEq Oral Once  . sucralfate  1 g Oral TID WC & HS   Continuous Infusions: . lactated ringers 50 mL/hr at 06/25/17 2135  . piperacillin-tazobactam (ZOSYN)  IV 3.375 g (06/26/17 0752)     LOS: 2 days    Dron Tanna Furry, MD [facility] Pager [phone]  If 7PM-7AM, please contact night-coverage [email or web address] Password [facility] 06/26/2017, 10:57 AM

## 2017-06-26 NOTE — Consult Note (Addendum)
Consultation  Referring Provider: Dr. Carolin Sicks    Primary Care Physician:  Girtha Rm, NP-C Primary Gastroenterologist: Althia Forts (previously [name], recently discharged from their practice)        Reason for Consultation: Nausea, vomiting and abdominal pain             HPI:   [name] is a 27 y.o. female with a history of poorly controlled diabetes, perirectal abscess, gluteal abscess and gastroparesis (reported history of Crohn's has been removed from diagnosis list as there is no evidence of this), who presented to the ER on 06/23/17 with a complaint of lower abdominal pain.   (The patient has had most multiple hospital stays for similar complaints in the past and was previously followed by Dr. Benson [name] until she was discharged from their clinic recently.)    Please see consult note by Dr. Benson [name] during patient's last admission 04/10/17.  Apparently had prolonged admission during the first half of December when she presented with severely malnourished state.  Albumin was low and had anasarca as well as ascites.  Empirically treated for C. difficile but did not improve her situation.  Creon was attempted but no improvement.  Scheduled Imodium and Lomotil were tried which helped.  The most benefit was achieved Cholestyramine regimen.  After last admission was recommended to stay on a rigid cholestyramine regimen.  It was discussed that it is unknown where her history of "Crohn's" had come from as there is no evidence of inflammation with biopsies, imaging or inflammatory markers.     Today, patient explains that she developed severe epigastric cramping on Thursday, 06/18/17 which turned into diarrhea which was uncontrollable the next day.  Eating made her pain worse which seemed to radiate into her left side and she went to the ER.  Was told she had colitis and started on antibiotics but her symptoms worsened over the weekend and she represented to the ER on 06/23/17.    Since admission  patient has remained nauseous and tells me she is only able to tolerate a liquid diet.  Her abdominal pain is getting better but "still there", rated as a 6-7/10.  Her stooling has decreased, now 2-3 stools a day as opposed to 5-6 at admission, but these are still very urgent and sometimes "I will stool in my sleep".  Patient has also been vomiting since admission 4-5 times just yesterday.      Patient describes better control of her diabetes since December.    Patient aware she has been discharged from Dr. Ulyses Amor clinic but has not made efforts to establish care elsewhere.    Denies fever, chills, blood in her stool or melena.  ED Course: Upon admission into the emergency department patient was noted to be afebrile, pulse 109-114, respirations 17-18, blood pressure 128/104-181/120, and O2 saturations maintained on room air.  Labs revealed WBC 3.7, hemoglobin 11.1, potassium 3.3, glucose 286.  Patient was given Bentyl, 4 mg of morphine, and 1 L of normal saline IV fluid. TRH called to admit.  CT abdomen pelvis 06/19/17 showing mild wall thickening throughout the colon likely mild acute colitis.  Changes of the subcutaneous region of the right gluteal region with surgical drain without significant change.  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  Minimal left base opacification likely atelectasis.  Previous GI workup: 03/10/17-flex sigmoidoscopy, Dr. Benson [name]: Entire examined colon was normal.  Biopsies negative/normal. 12/05/11-gastric emptying study:  Gastric emptying delayed 12/03/11-EGD, Dr. Benson [name]: Stomach filled with food contents  Past Medical History:  Diagnosis Date  . Anxiety   . Diabetes type 1, uncontrolled ([place]) 11/14/2011   Since age 67  . Fibromyalgia   . Gastroparesis   . Hypertension   . Infection    UTI April 2016    Past Surgical History:  Procedure Laterality Date  . ANKLE SURGERY    . CHOLECYSTECTOMY   11/15/2011   Procedure: LAPAROSCOPIC CHOLECYSTECTOMY WITH INTRAOPERATIVE CHOLANGIOGRAM;  Surgeon: [name], MD;  Location: [facility];  Service: General;  Laterality: N/A;  . ESOPHAGOGASTRODUODENOSCOPY  12/03/2011   Procedure: ESOPHAGOGASTRODUODENOSCOPY (EGD);  Surgeon: Beryle Beams, MD;  Location: [facility];  Service: Endoscopy;  Laterality: N/A;  . FLEXIBLE SIGMOIDOSCOPY N/A 03/10/2017   Procedure: FLEXIBLE SIGMOIDOSCOPY;  Surgeon: Carol Ada, MD;  Location: [facility];  Service: Endoscopy;  Laterality: N/A;  . INCISION AND DRAINAGE PERIRECTAL ABSCESS N/A 03/01/2017   Procedure: IRRIGATION AND DEBRIDEMENT PERIRECTAL ABSCESS;  Surgeon: Alphonsa Overall, MD;  Location: [facility];  Service: General;  Laterality: N/A;  . IRRIGATION AND DEBRIDEMENT BUTTOCKS N/A 03/23/2017   Procedure: IRRIGATION AND DEBRIDEMENT BUTTOCKS, SETON PLACEMENT;  Surgeon: [name], MD;  Location: [facility];  Service: General;  Laterality: N/A;  . LAPAROSCOPY  11/23/2011   Procedure: LAPAROSCOPY DIAGNOSTIC;  Surgeon: Edward Jolly, MD;  Location: [facility];  Service: General;  Laterality: N/A;    Family History  Problem Relation Age of Onset  . Diabetes Mother   . Hypertension Father      Social History   Tobacco Use  . Smoking status: Never Smoker  . Smokeless tobacco: Never Used  Substance Use Topics  . Alcohol use: No    Alcohol/week: 0.0 oz    Frequency: Never  . Drug use: No    Prior to Admission medications   Medication Sig Start Date End Date Taking? Authorizing Provider  acetaminophen (TYLENOL) 500 MG tablet Take 1,000 mg by mouth daily as needed for moderate pain.    Yes [provider]  alum & mag hydroxide-simeth (MAALOX ADVANCED MAX ST) 176-160-73 MG/5ML suspension Take 10 mLs by mouth every 6 (six) hours as needed for indigestion. 05/31/17  Yes Cardama, Grayce Sessions, MD  cholestyramine light (PREVALITE) 4 g packet Take 1 packet (4 g total) by mouth 3 (three) times daily. Patient  taking differently: Take 4 g by mouth 2 (two) times daily as needed (diarrhea).  03/14/17  Yes Lavina Hamman, MD  ciprofloxacin (CIPRO) 500 MG tablet Take 1 tablet (500 mg total) by mouth 2 (two) times daily. 06/19/17  Yes [name], MD  citalopram (CELEXA) 20 MG tablet Take 1 tablet (20 mg total) by mouth daily. 05/13/17  Yes Henson, Vickie L, NP-C  cyclobenzaprine (FLEXERIL) 10 MG tablet Take 1 tablet (10 mg total) by mouth 2 (two) times daily as needed for muscle spasms. 05/21/17  Yes Khatri, Hina, PA-C  diphenoxylate-atropine (LOMOTIL) 2.5-0.025 MG tablet Take 2 tablets by mouth 4 (four) times daily. 05/18/17  Yes Henson, Vickie L, NP-C  famotidine (PEPCID) 40 MG tablet Take 1 tablet (40 mg total) by mouth 2 (two) times daily. 06/21/17  Yes Recardo Evangelist, PA-C  ferrous sulfate 325 (65 FE) MG tablet Take 1 tablet (325 mg total) by mouth 2 (two) times daily with a meal. 05/13/17  Yes Henson, Vickie L, NP-C  folic acid (FOLVITE) 1 MG tablet Take 1 tablet (1 mg total) by mouth daily. 05/13/17  Yes Henson, Vickie L, NP-C  gabapentin (NEURONTIN) 300 MG capsule Take 1 capsule (300 mg total) by mouth 2 (two) times daily. 04/03/17  Yes Henson, Vickie L, NP-C  insulin lispro (HUMALOG) 100 UNIT/ML injection Inject 0.02 mLs (2 Units total) into the skin 3 (three) times daily with meals. Patient taking differently: Inject 0-9 Units into the skin 3 (three) times daily with meals. Sliding scale 04/09/17  Yes Renato Shin, MD  lidocaine (LIDODERM) 5 % Place 1 patch onto the skin daily. Remove & Discard patch within 12 hours or as directed by MD Patient taking differently: Place 1 patch onto the skin daily as needed (pain). Remove & Discard patch within 12 hours or as directed by MD 05/21/17  Yes Khatri, Hina, PA-C  lidocaine (XYLOCAINE) 2 % solution Use as directed 15 mLs in the mouth or throat every 6 (six) hours as needed (stomach pain). 05/31/17  Yes Cardama, Grayce Sessions, MD  lipase/protease/amylase  (CREON) 36000 UNITS CPEP capsule Take 1 capsule (36,000 Units total) by mouth 3 (three) times daily with meals. 03/14/17  Yes Lavina Hamman, MD  loperamide (IMODIUM) 2 MG capsule Take 1 capsule (2 mg total) by mouth 3 (three) times daily before meals. DO NOT TAKE IT FOR THE DAY YOU DONT HAVE A BOWEL MOVEMENT. Patient taking differently: Take 2 mg by mouth 3 (three) times daily as needed for diarrhea or loose stools.  04/03/17  Yes Henson, Vickie L, NP-C  metoCLOPramide (REGLAN) 5 MG tablet Take 5 mg by mouth every 6 (six) hours as needed for nausea or vomiting.    Yes [provider]  metroNIDAZOLE (FLAGYL) 500 MG tablet Take 1 tablet (500 mg total) by mouth 2 (two) times daily. 06/19/17  Yes [name], MD  oxyCODONE-acetaminophen (PERCOCET/ROXICET) 5-325 MG tablet Take 1 tablet by mouth every 6 (six) hours as needed for severe pain. 06/20/17  Yes Petrucelli, Samantha R, PA-C  promethazine (PHENERGAN) 25 MG tablet Take 1 tablet (25 mg total) by mouth every 6 (six) hours as needed for nausea or vomiting. 06/20/17  Yes Petrucelli, Samantha R, PA-C  sucralfate (CARAFATE) 1 GM/10ML suspension Take 10 mLs (1 g total) by mouth 4 (four) times daily -  with meals and at bedtime. 06/17/17  Yes Upstill, Nehemiah Settle, PA-C  EPINEPHrine (EPIPEN 2-PAK) 0.3 mg/0.3 mL IJ SOAJ injection Inject 0.3 mLs (0.3 mg total) into the muscle as needed (allergic reaction). Patient taking differently: Inject 0.3 mg into the muscle once as needed (severe allergic reaction).  03/21/17   Aline August, MD  furosemide (LASIX) 40 MG tablet Take 40 mg by mouth daily as needed for fluid.     [provider]  glucose 4 GM chewable tablet Chew 1 tablet (4 g total) by mouth as needed for low blood sugar. Patient taking differently: Chew 1 tablet by mouth once as needed for low blood sugar.  08/10/14   Kandis Cocking A, CNM  LANTUS 100 UNIT/ML injection Inject 0.18 mLs (18 Units total) into the skin every morning. Patient  taking differently: Inject 18 Units into the skin daily before breakfast.  05/07/17   Renato Shin, MD    Current Facility-Administered Medications  Medication Dose Route Frequency Provider Last Rate Last Dose  . acetaminophen (TYLENOL) tablet 650 mg  650 mg Oral Q6H PRN Fuller Plan A, MD       Or  . acetaminophen (TYLENOL) suppository 650 mg  650 mg Rectal Q6H PRN Norval Morton, MD      .  cholestyramine light (PREVALITE) packet 4 g  4 g Oral BID PRN Fuller Plan A, MD      . citalopram (CELEXA) tablet 20 mg  20 mg Oral Daily Smith, Rondell A, MD   20 mg at 06/26/17 1055  . cyclobenzaprine (FLEXERIL) tablet 10 mg  10 mg Oral BID PRN Fuller Plan A, MD      . diphenoxylate-atropine (LOMOTIL) 2.5-0.025 MG per tablet 2 tablet  2 tablet Oral QID Fuller Plan A, MD   2 tablet at 06/26/17 1055  . enoxaparin (LOVENOX) injection 40 mg  40 mg Subcutaneous Q24H Fuller Plan A, MD   40 mg at 06/25/17 2127  . ferrous sulfate tablet 325 mg  325 mg Oral BID WC Tamala Julian, Rondell A, MD   325 mg at 06/24/17 0807  . folic acid (FOLVITE) tablet 1 mg  1 mg Oral Daily Smith, Rondell A, MD   1 mg at 06/26/17 1055  . gabapentin (NEURONTIN) capsule 300 mg  300 mg Oral BID Fuller Plan A, MD   300 mg at 06/26/17 1055  . hydrALAZINE (APRESOLINE) injection 10 mg  10 mg Intravenous Q4H PRN Fuller Plan A, MD   10 mg at 06/23/17 2302  . HYDROmorphone (DILAUDID) injection 1 mg  1 mg Intravenous Q4H PRN Arrien, Jimmy Picket, MD   1 mg at 06/26/17 0732  . insulin aspart (novoLOG) injection 0-9 Units  0-9 Units Subcutaneous Q4H Fuller Plan A, MD   1 Units at 06/26/17 0341  . lactated ringers infusion   Intravenous Continuous Tawni Millers, MD 50 mL/hr at 06/25/17 2135    . lidocaine (LIDODERM) 5 % 1 patch  1 patch Transdermal Daily PRN Smith, Rondell A, MD      . lidocaine (XYLOCAINE) 2 % viscous mouth solution 15 mL  15 mL Mouth/Throat Q6H PRN Smith, Rondell A, MD      . lipase/protease/amylase  (CREON) capsule 36,000 Units  36,000 Units Oral TID WC Norval Morton, MD   36,000 Units at 06/25/17 1702  . magnesium oxide (MAG-OX) tablet 400 mg  400 mg Oral TID Tawni Millers, MD   400 mg at 06/26/17 1055  . multivitamin with minerals tablet 1 tablet  1 tablet Oral Daily Arrien, Jimmy Picket, MD   1 tablet at 06/26/17 1055  . ondansetron (ZOFRAN) tablet 4 mg  4 mg Oral Q6H PRN Fuller Plan A, MD       Or  . ondansetron (ZOFRAN) injection 4 mg  4 mg Intravenous Q6H PRN Fuller Plan A, MD   4 mg at 06/26/17 0534  . pantoprazole (PROTONIX) injection 40 mg  40 mg Intravenous Q12H Arrien, Jimmy Picket, MD   40 mg at 06/26/17 1054  . piperacillin-tazobactam (ZOSYN) IVPB 3.375 g  3.375 g Intravenous Q8H Smith, Rondell A, MD 12.5 mL/hr at 06/26/17 0752 3.375 g at 06/26/17 0752  . potassium chloride SA (K-DUR,KLOR-CON) CR tablet 40 mEq  40 mEq Oral Once Arrien, Jimmy Picket, MD      . promethazine (PHENERGAN) injection 12.5 mg  12.5 mg Intravenous Q6H PRN Arrien, Jimmy Picket, MD   12.5 mg at 06/26/17 0750  . sucralfate (CARAFATE) 1 GM/10ML suspension 1 g  1 g Oral TID WC & HS Tamala Julian, Rondell A, MD   1 g at 06/26/17 0754    Allergies as of 06/23/2017 - Review Complete 06/23/2017  Allergen Reaction Noted  . Other Anaphylaxis 12/13/2011  . Lactose intolerance (gi) Diarrhea 01/22/2015     Review  of Systems:    Constitutional: No weight loss, fever or chills Skin: No rash  Cardiovascular: No chest pain Respiratory: No SOB  Gastrointestinal: See HPI and otherwise negative Genitourinary: No dysuria Neurological: No headache Musculoskeletal: No new muscle or joint pain Hematologic: No bleeding Psychiatric: No history of depression or anxiety   Physical Exam:  Vital signs in last 24 hours: Temp:  [97.8 F (36.6 C)-98.4 F (36.9 C)] 97.9 F (36.6 C) (03/29 0606) Pulse Rate:  [97-107] 97 (03/29 0606) Resp:  [16] 16 (03/29 0606) BP: (115-129)/(81-92) 115/81 (03/29  0606) SpO2:  [97 %-100 %] 98 % (03/29 0606) Last BM Date: 06/25/17 General:   AA female appears to be in NAD, Well developed, Well nourished, alert and cooperative Head:  Normocephalic and atraumatic. Eyes:   PEERL, EOMI. No icterus. Conjunctiva pink. Ears:  Normal auditory acuity. Neck:  Supple Throat: Oral cavity and pharynx without inflammation, swelling or lesion.  Lungs: Respirations even and unlabored. Lungs clear to auscultation bilaterally.   No wheezes, crackles, or rhonchi.  Heart: Normal S1, S2. No MRG. Regular rate and rhythm. No peripheral edema, cyanosis or pallor.  Abdomen:  Soft, nondistended, moderate b/l lower abdominal ttp, No rebound or guarding. Normal bowel sounds. No appreciable masses or hepatomegaly. Rectal:  Not performed.  Msk:  Symmetrical without gross deformities.  Extremities:  Without edema, no deformity or joint abnormality.  Neurologic:  Alert and  oriented x4;  grossly normal neurologically.  Skin:   Dry and intact without significant lesions or rashes. Psychiatric:  Demonstrates good judgement and reason without abnormal affect or behaviors.   LAB RESULTS: Recent Labs    06/23/17 1225 06/24/17 0449 06/26/17 0419  WBC 3.7* 4.2 4.8  HGB 11.1* 10.2* 8.9*  HCT 31.6* 29.4* 27.5*  PLT 192 184 177   BMET Recent Labs    06/24/17 0449 06/25/17 0434 06/26/17 0419  NA 137 140 140  K 3.1* 3.6 3.5  CL 102 104 105  CO2 25 28 29   GLUCOSE 124* 122* 128*  BUN <5* <5* <5*  CREATININE 0.45 0.42* 0.50  CALCIUM 8.6* 8.9 8.6*   LFT Recent Labs    06/23/17 1225  PROT 7.5  ALBUMIN 3.7  AST 16  ALT 11*  ALKPHOS 81  BILITOT 0.6   EXAM: CT ABDOMEN AND PELVIS WITH CONTRAST 06/19/17  TECHNIQUE: Multidetector CT imaging of the abdomen and pelvis was performed using the standard protocol following bolus administration of intravenous contrast.  CONTRAST:  13m ISOVUE-300 IOPAMIDOL (ISOVUE-300) INJECTION 61%  COMPARISON:  05/27/2017 and  01/15/2016 as well as 04/10/2017  FINDINGS: Lower chest: Lung bases demonstrate minimal opacification of the left lower lobe likely atelectasis. No effusion.  Hepatobiliary: Previous cholecystectomy. Liver and biliary tree are normal.  Pancreas: Normal.  Spleen: Normal.  Adrenals/Urinary Tract: Adrenal glands are normal. Kidneys are normal in size without hydronephrosis or nephrolithiasis. Ureters and bladder are unremarkable.  Stomach/Bowel: Stomach is normal. Small bowel is within normal. Appendix is normal. There is minimal wall thickening throughout the colon likely due to mild colitis associated due to patient's Crohn's disease. No free fluid or free peritoneal air.  Vascular/Lymphatic: Within normal.  Reproductive: Within normal.  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  Musculoskeletal: Within normal.  IMPRESSION: Mild wall thickening throughout the colon likely mild acute colitis due to patient's Crohn's disease.  Changes over the subcutaneous region of the right gluteal  region with surgical drain without significant change. 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  Minimal left base opacification likely atelectasis. Early infection is possible.   Electronically Signed   By: Marin Olp M.D.   On: 06/19/2017 14:30   Impression / Plan:   Impression: 1.  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  Nausea and vomiting: Patient with known  history of gastroparesis  Plan: 1.  Continue antibiotics for now. 2.  Will arrange for colonoscopy tomorrow with Dr. Ardis Hughs. Did discuss risk, benefits, limitations and alternatives and the patient agrees to proceed. 3.  Patient will be on clears today and n.p.o. after midnight 4.  Movi prep has been ordered.  Hold antidiarrheals during prep. 5.  Please await further recommendations from Dr. Ardis Hughs later today.  Thank you for your kind consultation, we will continue to follow.  Lavone Nian Lemmon  06/26/2017, 11:18 AM Pager #: [phone]   ________________________________________________________________________  Velora Heckler GI MD note:  I personally examined the patient, reviewed the data and agree with the assessment and plan described above.  I'm really not sure if she has colitis, the CT reading by radiologist is pretty underwhelming and was based on false information that she had Crohn's disease. Her bowels have not been normal for at least a year certainly at colonoscopy is warranted at this point to check for IBD, infection, attempt to understand her pains.  Planning colonoscopy tomorrow.    Owens Loffler, MD [facility] Pager [phone]

## 2017-06-27 ENCOUNTER — Inpatient Hospital Stay (HOSPITAL_COMMUNITY): Payer: 59 | Admitting: Certified Registered"

## 2017-06-27 ENCOUNTER — Encounter (HOSPITAL_COMMUNITY): Payer: Self-pay | Admitting: Certified Registered"

## 2017-06-27 ENCOUNTER — Encounter (HOSPITAL_COMMUNITY)
Admission: EM | Disposition: A | Discharge status: Home / Self Care | Payer: Self-pay | Source: Home / Self Care | Attending: Internal Medicine

## 2017-06-27 DIAGNOSIS — K529 Noninfective gastroenteritis and colitis, unspecified: Secondary | ICD-10-CM

## 2017-06-27 HISTORY — PX: COLONOSCOPY WITH PROPOFOL: SHX5780

## 2017-06-27 LAB — CBC
HCT: 30 % — ABNORMAL LOW (ref 36.0–46.0)
Hemoglobin: 10.3 g/dL — ABNORMAL LOW (ref 12.0–15.0)
MCH: 29.2 pg (ref 26.0–34.0)
MCHC: 34.3 g/dL (ref 30.0–36.0)
MCV: 85 fL (ref 78.0–100.0)
Platelets: 199 10*3/uL (ref 150–400)
RBC: 3.53 MIL/uL — ABNORMAL LOW (ref 3.87–5.11)
RDW: 13.2 % (ref 11.5–15.5)
WBC: 3.8 10*3/uL — ABNORMAL LOW (ref 4.0–10.5)

## 2017-06-27 LAB — BASIC METABOLIC PANEL
Anion gap: 7 (ref 5–15)
BUN: <5 mg/dL — ABNORMAL LOW (ref 6–20)
CO2: 30 mmol/L (ref 22–32)
Calcium: 9 mg/dL (ref 8.9–10.3)
Chloride: 105 mmol/L (ref 101–111)
Creatinine, Ser: 0.56 mg/dL (ref 0.44–1.00)
GFR calc Af Amer: >60 mL/min (ref >60)
GFR calc non Af Amer: >60 mL/min (ref >60)
Glucose, Bld: 180 mg/dL — ABNORMAL HIGH (ref 65–99)
Potassium: 3.6 mmol/L (ref 3.5–5.1)
Sodium: 142 mmol/L (ref 135–145)

## 2017-06-27 LAB — GLUCOSE, CAPILLARY
Glucose-Capillary: 204 mg/dL — ABNORMAL HIGH (ref 65–99)
Glucose-Capillary: 104 mg/dL — ABNORMAL HIGH (ref 65–99)
Glucose-Capillary: 137 mg/dL — ABNORMAL HIGH (ref 65–99)
Glucose-Capillary: 217 mg/dL — ABNORMAL HIGH (ref 65–99)

## 2017-06-27 LAB — MAGNESIUM: Magnesium: 1.6 mg/dL — ABNORMAL LOW (ref 1.7–2.4)

## 2017-06-27 SURGERY — COLONOSCOPY WITH PROPOFOL
Anesthesia: Monitor Anesthesia Care

## 2017-06-27 MED ORDER — PROPOFOL 500 MG/50ML IV EMUL
INTRAVENOUS | Status: DC | PRN
  Administered 2017-06-27: 125 ug/kg/min via INTRAVENOUS

## 2017-06-27 MED ORDER — MAGNESIUM SULFATE 2 GM/50ML IV SOLN
2.0000 g | Freq: Once | INTRAVENOUS | Status: AC
  Administered 2017-06-27: 2 g via INTRAVENOUS
  Filled 2017-06-27: qty 50

## 2017-06-27 MED ORDER — PROPOFOL 10 MG/ML IV BOLUS
INTRAVENOUS | Status: AC
  Filled 2017-06-27: qty 40

## 2017-06-27 MED ORDER — LACTATED RINGERS IV SOLN
INTRAVENOUS | Status: DC | PRN
  Administered 2017-06-27: 09:00:00 via INTRAVENOUS

## 2017-06-27 MED ORDER — LIDOCAINE 2% (20 MG/ML) 5 ML SYRINGE
INTRAMUSCULAR | Status: DC | PRN
  Administered 2017-06-27: 100 mg via INTRAVENOUS

## 2017-06-27 MED ORDER — LACTATED RINGERS IV SOLN: INTRAVENOUS | Status: DC

## 2017-06-27 MED ORDER — PROPOFOL 10 MG/ML IV BOLUS
INTRAVENOUS | Status: DC | PRN
  Administered 2017-06-27: 20 mg via INTRAVENOUS
  Administered 2017-06-27: 30 mg via INTRAVENOUS

## 2017-06-27 SURGICAL SUPPLY — 22 items

## 2017-06-27 NOTE — Transfer of Care (Signed)
Immediate Anesthesia Transfer of Care Note  Patient: [name]  Procedure(s) Performed: COLONOSCOPY WITH PROPOFOL (N/A )  Patient Location: PACU  Anesthesia Type:MAC  Level of Consciousness: awake, alert  and oriented  Airway & Oxygen Therapy: Patient Spontanous Breathing and Patient connected to face mask oxygen  Post-op Assessment: Report given to RN and Post -op Vital signs reviewed and stable  Post vital signs: Reviewed and stable  Last Vitals:  Vitals Value Taken Time  BP    Temp    Pulse    Resp    SpO2      Last Pain:  Vitals:   06/27/17 0903  TempSrc: Oral  PainSc: 7       Patients Stated Pain Goal: 8 (73/42/87 6811)  Complications: No apparent anesthesia complications

## 2017-06-27 NOTE — Interval H&P Note (Signed)
History and Physical Interval Note:  5/36/9223 0:09 AM  [name]  has presented today for surgery, with the diagnosis of Colitis  The various methods of treatment have been discussed with the patient and family. After consideration of risks, benefits and other options for treatment, the patient has consented to  Procedure(s): COLONOSCOPY WITH PROPOFOL (N/A) as a surgical intervention .  The patient's history has been reviewed, patient examined, no change in status, stable for surgery.  I have reviewed the patient's chart and labs.  Questions were answered to the patient's satisfaction.     Milus Banister

## 2017-06-27 NOTE — Discharge Summary (Signed)
Physician Discharge Summary  [name] EXB:[id number] DOB: 03/20/1991 DOA: 06/23/2017  PCP: Girtha Rm, NP-C  Admit date: 06/23/2017 Discharge date: 06/27/2017  Admitted From: Home Disposition:  Home  Discharge Condition:Stable CODE STATUS:Full Diet recommendation:  Carb Modified   Brief/Interim Summary: 27 year old female with history of Crohn's disease, diabetes, gastroparesis presented with nausea vomiting abdominal pain and diarrhea.  CT scan with colitis.  Failed outpatient oral antibiotics.  Started on IV antibiotics. GI consulted and she underwent colonoscopy today.  Colonoscopy finding was unremarkable,no signs of inflammation or colitis.  Antibiotics discontinued.  Patient is stable for discharge to home today.  GI cleared for discharge.  Most likely her GI symptoms were associated with gastroparesis.  Following problems were addressed during her hospitalization:  Suspected colitis: Currently there is improvement in symptoms of nausea ,vomiting abdominal pain and diarrhea.    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  She underwent colonoscopy today, there were no signs of  Crohn's disease or ulcerative colitis.     She has C. 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 Antibiotics discontinued.  Chronic abdominal pain: This issue is chronic.  She has multiple admissions in the past for this.  Most likely her abdomen pain is associated with gastroparesis.  We recommend frequent small volume meals, avoid fatty diet and good control of her diabetes. Continue her home regimen.  Type 2 diabetes: Blood sugar acceptable. Continue home regimen.  Anxiety /depression: Continue Celexa and supportive care.  Hypokalemia: Potassium level improved after supplementation.    Discharge Diagnoses:  Principal Problem:   Colitis Active Problems:   Gastroparesis   Diabetes mellitus type 1  (HCC)   Nausea and vomiting   SIRS (systemic inflammatory response syndrome) (HCC)   Hypokalemia   Normocytic normochromic anemia   GERD (gastroesophageal reflux disease)    Discharge Instructions  Discharge Instructions    Diet - low sodium heart healthy   Complete by:  As directed    Discharge instructions   Complete by:  As directed    1) Follow up with PCP as an outpatient. 2) Follow up with gastroenterology as an outpatient. 3)Please take small volume of  frequent meals.Avoid fatty food. 4) Continue your home medications.   Increase activity slowly   Complete by:  As directed      Allergies as of 06/27/2017      Reactions   Other Anaphylaxis   Reaction to [place] nuts   Lactose Intolerance (gi) Diarrhea      Medication List    STOP taking these medications   ciprofloxacin 500 MG tablet Commonly known as:  CIPRO   diphenoxylate-atropine 2.5-0.025 MG tablet Commonly known as:  LOMOTIL   metroNIDAZOLE 500 MG tablet Commonly known as:  FLAGYL     TAKE these medications   acetaminophen 500 MG tablet Commonly known as:  TYLENOL Take 1,000 mg by mouth daily as needed for moderate pain.   cholestyramine light 4 g packet Commonly known as:  PREVALITE Take 1 packet (4 g total) by mouth 3 (three) times daily. What changed:    when to take this  reasons to take this   citalopram 20 MG tablet Commonly known as:  CELEXA Take 1 tablet (20 mg total) by mouth daily.   cyclobenzaprine 10 MG tablet Commonly known as:  FLEXERIL Take 1 tablet (10 mg total) by mouth 2 (two) times daily as needed for muscle spasms.  EPINEPHrine 0.3 mg/0.3 mL Soaj injection Commonly known as:  EPIPEN 2-PAK Inject 0.3 mLs (0.3 mg total) into the muscle as needed (allergic reaction). What changed:    when to take this  reasons to take this   famotidine 40 MG tablet Commonly known as:  PEPCID Take 1 tablet (40 mg total) by mouth 2 (two) times daily.   ferrous sulfate 325 (65  FE) MG tablet Take 1 tablet (325 mg total) by mouth 2 (two) times daily with a meal.   folic acid 1 MG tablet Commonly known as:  FOLVITE Take 1 tablet (1 mg total) by mouth daily.   furosemide 40 MG tablet Commonly known as:  LASIX Take 40 mg by mouth daily as needed for fluid.   gabapentin 300 MG capsule Commonly known as:  NEURONTIN Take 1 capsule (300 mg total) by mouth 2 (two) times daily.   glucose 4 GM chewable tablet Chew 1 tablet (4 g total) by mouth as needed for low blood sugar. What changed:  when to take this   insulin lispro 100 UNIT/ML injection Commonly known as:  HUMALOG Inject 0.02 mLs (2 Units total) into the skin 3 (three) times daily with meals. What changed:    how much to take  additional instructions   LANTUS 100 UNIT/ML injection Generic drug:  insulin glargine Inject 0.18 mLs (18 Units total) into the skin every morning. What changed:  when to take this   lidocaine 2 % solution Commonly known as:  XYLOCAINE Use as directed 15 mLs in the mouth or throat every 6 (six) hours as needed (stomach pain).   lidocaine 5 % Commonly known as:  LIDODERM Place 1 patch onto the skin daily. Remove & Discard patch within 12 hours or as directed by MD What changed:    when to take this  reasons to take this  additional instructions   lipase/protease/amylase 36000 UNITS Cpep capsule Commonly known as:  CREON Take 1 capsule (36,000 Units total) by mouth 3 (three) times daily with meals.   loperamide 2 MG capsule Commonly known as:  IMODIUM Take 1 capsule (2 mg total) by mouth 3 (three) times daily before meals. DO NOT TAKE IT FOR THE DAY YOU DONT HAVE A BOWEL MOVEMENT. What changed:    when to take this  reasons to take this  additional instructions   metoCLOPramide 5 MG tablet Commonly known as:  REGLAN Take 5 mg by mouth every 6 (six) hours as needed for nausea or vomiting.   sucralfate 1 GM/10ML suspension Commonly known as:   CARAFATE Take 10 mLs (1 g total) by mouth 4 (four) times daily -  with meals and at bedtime.      Follow-up Information    Henson, Vickie L, NP-C. Schedule an appointment as soon as possible for a visit in 1 week(s).   Specialty:  Family Medicine Contact information: [address] [place] [address] [phone]          Allergies  Allergen Reactions  . Other Anaphylaxis    Reaction to [place] nuts   . Lactose Intolerance (Gi) Diarrhea    Consultations:  Gastroenterology   Procedures/Studies: Ct Abdomen Pelvis W Contrast  Result Date: 06/19/2017 CLINICAL DATA:  Left mid abdominal pain and diarrhea 1 week. Crohn's disease. EXAM: CT ABDOMEN AND PELVIS WITH CONTRAST TECHNIQUE: Multidetector CT imaging of the abdomen and pelvis was performed using the standard protocol following bolus administration of intravenous contrast. CONTRAST:  154m ISOVUE-300 IOPAMIDOL (ISOVUE-300) INJECTION  61% COMPARISON:  05/27/2017 and 01/15/2016 as well as 04/10/2017 FINDINGS: Lower chest: Lung bases demonstrate minimal opacification of the left lower lobe likely atelectasis. No effusion. Hepatobiliary: Previous cholecystectomy. Liver and biliary tree are normal. Pancreas: Normal. Spleen: Normal. Adrenals/Urinary Tract: Adrenal glands are normal. Kidneys are normal in size without hydronephrosis or nephrolithiasis. Ureters and bladder are unremarkable. Stomach/Bowel: Stomach is normal. Small bowel is within normal. Appendix is normal. There is minimal wall thickening throughout the colon likely due to mild colitis associated due to patient's Crohn's disease. No free fluid or free peritoneal air. Vascular/Lymphatic: Within normal. Reproductive: Within normal. 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 Musculoskeletal: Within normal. IMPRESSION: Mild wall thickening throughout the colon  likely mild acute colitis due to patient's Crohn's disease. Changes over the subcutaneous region of the right gluteal region with surgical drain without significant change. 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 Minimal left base opacification likely atelectasis. Early infection is possible. Electronically Signed   By: Marin Olp M.D.   On: 06/19/2017 14:30    (Echo, Carotid, EGD, Colonoscopy, ERCP)    Subjective: Patient seen and examined the bedside this morning.  She just came after colonoscopy.  Her symptoms of nausea, vomiting and diarrhea have improved.  She still complains of nonspecific abdominal pain. Patient is stable for discharge to home today.  Discharge Exam: Vitals:   06/27/17 1000 06/27/17 1410  BP: (!) 163/95 (!) 138/93  Pulse: (!) 110 (!) 110  Resp: (!) 8   Temp:  98.6 F (37 C)  SpO2: 98% 99%   Vitals:   06/27/17 0950 06/27/17 0955 06/27/17 1000 06/27/17 1410  BP:   (!) 163/95 (!) 138/93  Pulse: (!) 110 (!) 110 (!) 110 (!) 110  Resp: (!) 9 13 (!) 8   Temp:    98.6 F (37 C)  TempSrc:    Oral  SpO2: 100% 98% 98% 99%  Weight:      Height:        General: Pt is alert, awake, not in acute distress Cardiovascular: RRR, S1/S2 +, no rubs, no gallops Respiratory: CTA bilaterally, no wheezing, no rhonchi Abdominal: Soft, NT, ND, bowel sounds + Extremities: no edema, no cyanosis    The results of significant diagnostics from this hospitalization (including imaging, microbiology, ancillary and laboratory) are listed below for reference.     Microbiology: Recent Results (from the past 240 hour(s))  C difficile quick scan w PCR reflex     Status: Abnormal   Collection Time: 06/24/17  3:45 AM  Result Value Ref Range Status   C Diff antigen POSITIVE (A) NEGATIVE Final   C Diff toxin NEGATIVE NEGATIVE Final   C Diff interpretation Results are indeterminate. See PCR results.  Final     Comment: Performed at [facility], [place] [address], [place], [place] [address]  C. Diff by PCR, Reflexed     Status: None   Collection Time: 06/24/17  3:45 AM  Result Value Ref Range Status   Toxigenic C. Difficile by PCR NEGATIVE NEGATIVE Final    Comment: Patient is colonized with non toxigenic C. difficile. May not need treatment unless significant symptoms are present. Performed at [facility], [place] [address], [place], [place] [address]      Labs: BNP (last 3 results) No results for input(s): BNP in the  last 8760 hours. Basic Metabolic Panel: Recent Labs  Lab 06/23/17 1225 06/24/17 0449 06/25/17 0434 06/26/17 0419 06/27/17 0433  NA 137 137 140 140 142  K 3.3* 3.1* 3.6 3.5 3.6  CL 96* 102 104 105 105  CO2 29 25 28 29 30   GLUCOSE 286* 124* 122* 128* 180*  BUN <5* <5* <5* <5* <5*  CREATININE 0.56 0.45 0.42* 0.50 0.56  CALCIUM 9.1 8.6* 8.9 8.6* 9.0  MG  --  1.7 1.9  --  1.6*   Liver Function Tests: Recent Labs  Lab 06/21/17 1404 06/23/17 1225  AST 20 16  ALT 14 11*  ALKPHOS 80 81  BILITOT 0.8 0.6  PROT 7.4 7.5  ALBUMIN 3.3* 3.7   Recent Labs  Lab 06/21/17 1404 06/23/17 1225  LIPASE 20 22   No results for input(s): AMMONIA in the last 168 hours. CBC: Recent Labs  Lab 06/21/17 1404 06/23/17 1225 06/24/17 0449 06/26/17 0419 06/27/17 0433  WBC 3.9* 3.7* 4.2 4.8 3.8*  NEUTROABS  --   --   --  1.4*  --   HGB 10.9* 11.1* 10.2* 8.9* 10.3*  HCT 31.1* 31.6* 29.4* 27.5* 30.0*  MCV 81.6 80.6 82.8 84.1 85.0  PLT 187 192 184 177 199   Cardiac Enzymes: No results for input(s): CKTOTAL, CKMB, CKMBINDEX, TROPONINI in the last 168 hours. BNP: Invalid input(s): POCBNP CBG: Recent Labs  Lab 06/26/17 2241 06/26/17 2257 06/27/17 0346 06/27/17 0739 06/27/17 1209  GLUCAP 64* 76 204* 104* 137*   D-Dimer No results for input(s): DDIMER in the last 72 hours. Hgb A1c No results for input(s): HGBA1C in the last 72 hours. Lipid  Profile No results for input(s): CHOL, HDL, LDLCALC, TRIG, CHOLHDL, LDLDIRECT in the last 72 hours. Thyroid function studies No results for input(s): TSH, T4TOTAL, T3FREE, THYROIDAB in the last 72 hours.  Invalid input(s): FREET3 Anemia work up No results for input(s): VITAMINB12, FOLATE, FERRITIN, TIBC, IRON, RETICCTPCT in the last 72 hours. Urinalysis    Component Value Date/Time   COLORURINE YELLOW 06/23/2017 1721   APPEARANCEUR CLEAR 06/23/2017 1721   LABSPEC 1.012 06/23/2017 1721   PHURINE 6.0 06/23/2017 1721   GLUCOSEU 50 (A) 06/23/2017 1721   HGBUR MODERATE (A) 06/23/2017 1721   BILIRUBINUR NEGATIVE 06/23/2017 1721   BILIRUBINUR negative 01/02/2015 1717   KETONESUR 20 (A) 06/23/2017 1721   PROTEINUR 30 (A) 06/23/2017 1721   UROBILINOGEN 0.2 01/13/2015 0223   NITRITE NEGATIVE 06/23/2017 1721   LEUKOCYTESUR TRACE (A) 06/23/2017 1721   Sepsis Labs Invalid input(s): PROCALCITONIN,  WBC,  LACTICIDVEN Microbiology Recent Results (from the past 240 hour(s))  C difficile quick scan w PCR reflex     Status: Abnormal   Collection Time: 06/24/17  3:45 AM  Result Value Ref Range Status   C Diff antigen POSITIVE (A) NEGATIVE Final   C Diff toxin NEGATIVE NEGATIVE Final   C Diff interpretation Results are indeterminate. See PCR results.  Final    Comment: Performed at [facility], [place] [address], [place], [place] [address]  C. Diff by PCR, Reflexed     Status: None   Collection Time: 06/24/17  3:45 AM  Result Value Ref Range Status   Toxigenic C. Difficile by PCR NEGATIVE NEGATIVE Final    Comment: Patient is colonized with non toxigenic C. difficile. May not need treatment unless significant symptoms are present. Performed at [facility], [place] [address], [place], [place] [address]      Time coordinating  discharge: Over 30 minutes  SIGNED:   Shelly Coss, MD  [facility] 06/27/2017, 4:07 PM Pager [phone]  If 7PM-7AM, please  contact night-coverage [email or web address] Password [email or web address]

## 2017-06-27 NOTE — Anesthesia Preprocedure Evaluation (Signed)
Anesthesia Evaluation  Patient identified by MRN, date of birth, ID band Patient awake    Reviewed: Allergy & Precautions, NPO status , Patient's Chart, lab work & pertinent test results  History of Anesthesia Complications Negative for: history of anesthetic complications  Airway Mallampati: II  TM Distance: >3 FB Neck ROM: Full    Dental  (+) Teeth Intact, Chipped   Pulmonary neg pulmonary ROS,    breath sounds clear to auscultation       Cardiovascular hypertension, Pt. on medications  Rhythm:Regular     Neuro/Psych PSYCHIATRIC DISORDERS Anxiety Depression negative neurological ROS     GI/Hepatic Neg liver ROS, (+)     substance abuse  , Persistent diarrhea (c diff precautions); Crohn's disease, hx gastroparesis   Endo/Other  diabetes, Poorly Controlled, Type 1, Insulin Dependent  Renal/GU negative Renal ROS     Musculoskeletal  (+) Fibromyalgia -, narcotic dependent  Abdominal   Peds  Hematology  (+) anemia ,   Anesthesia Other Findings Poor nutritional status with low albumin  Reproductive/Obstetrics                             Anesthesia Physical  Anesthesia Plan  ASA: III and emergent  Anesthesia Plan: MAC   Post-op Pain Management:    Induction: Intravenous  PONV Risk Score and Plan: 2 and Treatment may vary due to age or medical condition  Airway Management Planned: Simple Face Mask  Additional Equipment: None  Intra-op Plan:   Post-operative Plan:   Informed Consent: I have reviewed the patients History and Physical, chart, labs and discussed the procedure including the risks, benefits and alternatives for the proposed anesthesia with the patient or authorized representative who has indicated his/her understanding and acceptance.   Dental advisory given  Plan Discussed with: CRNA  Anesthesia Plan Comments:         Anesthesia Quick Evaluation

## 2017-06-27 NOTE — Anesthesia Postprocedure Evaluation (Signed)
Anesthesia Post Note  Patient: [name]  Procedure(s) Performed: COLONOSCOPY WITH PROPOFOL (N/A )     Patient location during evaluation: PACU Anesthesia Type: MAC Level of consciousness: awake and alert Pain management: pain level controlled Vital Signs Assessment: post-procedure vital signs reviewed and stable Respiratory status: spontaneous breathing, nonlabored ventilation and respiratory function stable Cardiovascular status: stable and blood pressure returned to baseline Postop Assessment: no apparent nausea or vomiting Anesthetic complications: no    Last Vitals:  Vitals:   06/27/17 0942 06/27/17 0949  BP: (!) 168/95 (!) 159/94  Pulse: (!) 110 (!) 110  Resp: 12 (!) 9  Temp: 36.5 C   SpO2: 100% 98%    Last Pain:  Vitals:   06/27/17 0942  TempSrc: Oral  PainSc: 0-No pain                 Lynda Rainwater

## 2017-06-27 NOTE — Op Note (Signed)
[facility] Patient Name: [name] Procedure Date: 06/27/2017 MRN: [id number] Attending MD: Milus Banister , MD Date of Birth: 09-13-1990 CSN: [id number] Age: 27 Admit Type: Inpatient Procedure:                Colonoscopy Indications:              Abnormal CT of the GI tract: possible colitis on                            recent CT scan; chronic abdominal pains, chronic                            intermittent loose stools, recently discharged from                            [facility]; has been to ER 95 times in past                            6-7 years (31 visits in past 2 years) for a variety                            of symptoms. Providers:                Milus Banister, MD, Angus Seller, William Dalton, Technician Referring MD:             [facility] Medicines:                Monitored Anesthesia Care Complications:            No immediate complications. Estimated blood loss:                            None. Estimated Blood Loss:     Estimated blood loss: none. Procedure:                Pre-Anesthesia Assessment:                           - Prior to the procedure, a History and Physical                            was performed, and patient medications and                            allergies were reviewed. The patient's tolerance of                            previous anesthesia was also reviewed. The risks                            and benefits of the procedure and the sedation  options and risks were discussed with the patient.                            All questions were answered, and informed consent                            was obtained. Prior Anticoagulants: The patient has                            taken no previous anticoagulant or antiplatelet                            agents. ASA Grade Assessment: II - A patient with                            mild systemic disease.  After reviewing the risks                            and benefits, the patient was deemed in                            satisfactory condition to undergo the procedure.                           After obtaining informed consent, the colonoscope                            was passed under direct vision. Throughout the                            procedure, the patient's blood pressure, pulse, and                            oxygen saturations were monitored continuously. The                            ( [id number] ) [id number] was introduced through the                            anus and advanced to the the terminal ileum. The                            colonoscopy was performed without difficulty. The                            patient tolerated the procedure well. The quality                            of the bowel preparation was good. The terminal                            ileum, ileocecal valve, appendiceal orifice, and  rectum were photographed. Scope In: 9:21:55 AM Scope Out: 9:29:10 AM Scope Withdrawal Time: 0 hours 3 minutes 52 seconds  Total Procedure Duration: 0 hours 7 minutes 15 seconds  Findings:      The terminal ileum is normal.      The entire examined colon is normal.      Biopsies for histology were taken with a cold forceps from the entire       colon for evaluation of microscopic colitis. Impression:               - The examined portion of the ileum was normal.                           - The entire examined colon is normal.                           - Biopsies were taken with a cold forceps from the                            entire colon for evaluation of microscopic colitis.                           - She does not have any signs of Crohn's disease or                            Ulcerative Colitis. I suspect she has non                            gastrointestinal (chronic) abdominal pains. Moderate Sedation:      N/A- Per Anesthesia  Care Recommendation:           - Resume diet                           - OK for discharge from GI perspective.                           - I will contact her with final pathology from her                            colon biopsies (checking for microscopic colitis). Procedure Code(s):        --- Professional ---                           [id number], Colonoscopy, flexible; with biopsy, single                            or multiple Diagnosis Code(s):        --- Professional ---                           R93.3, Abnormal findings on diagnostic imaging of                            other parts of digestive tract CPT copyright 2016 [employer]. All rights reserved. The codes  documented in this report are preliminary and upon coder review may  be revised to meet current compliance requirements. Milus Banister, MD 06/27/2017 9:42:02 AM This report has been signed electronically. Number of Addenda: 0

## 2017-06-29 ENCOUNTER — Encounter (HOSPITAL_COMMUNITY): Payer: Self-pay | Admitting: Gastroenterology

## 2017-07-08 ENCOUNTER — Emergency Department (HOSPITAL_COMMUNITY)
Admission: EM | Admit: 2017-07-08 | Discharge: 2017-07-09 | Disposition: A | Discharge status: Home / Self Care | Payer: 59 | Source: Home / Self Care | Attending: Emergency Medicine | Admitting: Emergency Medicine

## 2017-07-08 ENCOUNTER — Encounter (HOSPITAL_COMMUNITY): Payer: Self-pay

## 2017-07-08 DIAGNOSIS — Z79899 Other long term (current) drug therapy: Secondary | ICD-10-CM

## 2017-07-08 DIAGNOSIS — G43A1 Cyclical vomiting, intractable: Secondary | ICD-10-CM | POA: Diagnosis not present

## 2017-07-08 DIAGNOSIS — F329 Major depressive disorder, single episode, unspecified: Secondary | ICD-10-CM | POA: Insufficient documentation

## 2017-07-08 DIAGNOSIS — R110 Nausea: Secondary | ICD-10-CM | POA: Diagnosis not present

## 2017-07-08 DIAGNOSIS — L0231 Cutaneous abscess of buttock: Secondary | ICD-10-CM

## 2017-07-08 DIAGNOSIS — F419 Anxiety disorder, unspecified: Secondary | ICD-10-CM | POA: Insufficient documentation

## 2017-07-08 DIAGNOSIS — I10 Essential (primary) hypertension: Secondary | ICD-10-CM

## 2017-07-08 DIAGNOSIS — R109 Unspecified abdominal pain: Secondary | ICD-10-CM | POA: Diagnosis not present

## 2017-07-08 DIAGNOSIS — Z794 Long term (current) use of insulin: Secondary | ICD-10-CM | POA: Insufficient documentation

## 2017-07-08 DIAGNOSIS — L03317 Cellulitis of buttock: Principal | ICD-10-CM

## 2017-07-08 DIAGNOSIS — Z9049 Acquired absence of other specified parts of digestive tract: Secondary | ICD-10-CM | POA: Insufficient documentation

## 2017-07-08 DIAGNOSIS — E109 Type 1 diabetes mellitus without complications: Secondary | ICD-10-CM

## 2017-07-08 DIAGNOSIS — R531 Weakness: Secondary | ICD-10-CM | POA: Diagnosis not present

## 2017-07-08 MED ORDER — VANCOMYCIN HCL IN DEXTROSE 1-5 GM/200ML-% IV SOLN
1000.0000 mg | Freq: Once | INTRAVENOUS | Status: AC
  Administered 2017-07-09: 1000 mg via INTRAVENOUS
  Filled 2017-07-08: qty 200

## 2017-07-08 MED ORDER — PIPERACILLIN-TAZOBACTAM 3.375 G IVPB 30 MIN
3.3750 g | Freq: Once | INTRAVENOUS | Status: AC
  Administered 2017-07-09: 3.375 g via INTRAVENOUS
  Filled 2017-07-08: qty 50

## 2017-07-08 MED ORDER — SODIUM CHLORIDE 0.9 % IV BOLUS (SEPSIS)
1000.0000 mL | Freq: Once | INTRAVENOUS | Status: AC
  Administered 2017-07-09: 1000 mL via INTRAVENOUS

## 2017-07-08 NOTE — ED Notes (Signed)
Dr. Erma Heritage made aware of pt's BP.

## 2017-07-08 NOTE — Progress Notes (Signed)
A consult was received from an ED physician for Vancomycin and Zosyn per pharmacy dosing.  The patient's profile has been reviewed for ht/wt/allergies/indication/available labs.   A one time order has been placed for Vancomycin 1gm iv x1 and Zosyn 3.375gm iv x1.  Further antibiotics/pharmacy consults should be ordered by admitting physician if indicated.                       Thank you, Aleene Davidson Crowford 07/08/2017  11:41 PM

## 2017-07-08 NOTE — ED Triage Notes (Signed)
Pt presents with c/o abscess to her right buttock. Pt also reports feeling faint, BP 80/60 in triage. Pt has recently been treated for the abscess.

## 2017-07-08 NOTE — ED Notes (Signed)
EKG given to EDP,[name] for review.

## 2017-07-08 NOTE — ED Provider Notes (Signed)
[facility] DEPT Provider Note   CSN: [id number] Arrival date & time: 07/08/17  2311     History   Chief Complaint Chief Complaint  Patient presents with  . Abscess    HPI [name] is a 27 y.o. female.  HPI   27 yo F with PMHx T1Dm and recurrent buttocks abscesses here with chills, fatigue, R buttocks wound. Pt states that she has had two surgical debridements of her abscess, recently in Jan at [facility], and has had persistent drainage since then. Over the past 2 days, she's noticed increasing aching, throbbing pain over her her buttocks. She's had increasing foul smelling drainage as well. No nausea, but has had chills, general fatigue. Sugars have been well controlled. Pain worse with sitting on it. No alleviating factors. She did take clinda/flagyl apparently 2 weeks ago, no ABX use since then. No diarrhea or abd pain. No alleviating factors.  Past Medical History:  Diagnosis Date  . Anxiety   . Diabetes type 1, uncontrolled ([place]) 11/14/2011   Since age 43  . Fibromyalgia   . Gastroparesis   . Hypertension   . Infection    UTI April 2016    Patient Active Problem List   Diagnosis Date Noted  . SIRS (systemic inflammatory response syndrome) ([place]) 06/23/2017  . Hypokalemia 06/23/2017  . Normocytic normochromic anemia 06/23/2017  . GERD (gastroesophageal reflux disease) 06/23/2017  . Intractable nausea and vomiting 04/11/2017  . Nausea and vomiting 04/10/2017  . Essential hypertension   . Colitis   . Crohn's disease ([place]) 04/03/2017  . Abscess and cellulitis of gluteal region 03/24/2017  . Cellulitis and abscess of buttock 03/23/2017  . Malnutrition of moderate degree 03/19/2017  . Anasarca 03/16/2017  . MDD (major depressive disorder), recurrent episode, mild ([place]) 02/28/2017  . Diarrhea 02/27/2017  . Lower GI bleed 02/27/2017  . DKA (diabetic ketoacidosis) ([place]) 02/26/2017  . Acute metabolic encephalopathy   . DKA, type 1 ([place])  06/11/2013  . Orthostatic hypotension dysautonomic syndrome ([place]) 04/28/2013  . Body aches 04/28/2013  . Orthostatic hypotension 04/28/2013  . Diabetes mellitus type 1 ([place]) 04/28/2013  . Protein-calorie malnutrition, severe ([place]) 04/28/2013  . Noncompliance with diabetes treatment 09/01/2012  . Dyspnea 02/28/2012  . Sinus tachycardia 02/28/2012  . Hyperglycemia without ketosis 02/28/2012  . Chest pain 12/04/2011  . Diabetes type 1, uncontrolled ([place]) 11/14/2011  . Hypertension associated with diabetes ([place]) 11/14/2011  . Gastroparesis 11/14/2011  . Hyponatremia 09/19/2011  . Abdominal pain 09/18/2011  . Metabolic alkalosis 25/95/6387  . Diabetic hyperosmolar non-ketotic state ([place]) 09/18/2011  . DIAB W/UNSPEC COMP TYPE I [JUV TYPE] UNCNTRL 06/04/2010  . CONSTIPATION 01/01/2010  . RECTAL PAIN 01/01/2010    Past Surgical History:  Procedure Laterality Date  . ANKLE SURGERY    . CHOLECYSTECTOMY  11/15/2011   Procedure: LAPAROSCOPIC CHOLECYSTECTOMY WITH INTRAOPERATIVE CHOLANGIOGRAM;  Surgeon: [name], MD;  Location: [facility] ORS;  Service: General;  Laterality: N/A;  . COLONOSCOPY WITH PROPOFOL N/A 06/27/2017   Procedure: COLONOSCOPY WITH PROPOFOL;  Surgeon: Milus Banister, MD;  Location: [facility] ENDOSCOPY;  Service: Endoscopy;  Laterality: N/A;  . ESOPHAGOGASTRODUODENOSCOPY  12/03/2011   Procedure: ESOPHAGOGASTRODUODENOSCOPY (EGD);  Surgeon: Beryle Beams, MD;  Location: [facility];  Service: Endoscopy;  Laterality: N/A;  . FLEXIBLE SIGMOIDOSCOPY N/A 03/10/2017   Procedure: FLEXIBLE SIGMOIDOSCOPY;  Surgeon: Carol Ada, MD;  Location: [facility];  Service: Endoscopy;  Laterality: N/A;  . INCISION AND DRAINAGE PERIRECTAL ABSCESS N/A 03/01/2017   Procedure:  IRRIGATION AND DEBRIDEMENT PERIRECTAL ABSCESS;  Surgeon: Alphonsa Overall, MD;  Location: [facility];  Service: General;  Laterality: N/A;  . IRRIGATION AND DEBRIDEMENT BUTTOCKS N/A 03/23/2017   Procedure: IRRIGATION AND DEBRIDEMENT  BUTTOCKS, SETON PLACEMENT;  Surgeon: [name], MD;  Location: [facility];  Service: General;  Laterality: N/A;  . LAPAROSCOPY  11/23/2011   Procedure: LAPAROSCOPY DIAGNOSTIC;  Surgeon: Edward Jolly, MD;  Location: [facility];  Service: General;  Laterality: N/A;     OB History    Gravida  1   Para  1   Term      Preterm  1   AB      Living  1     SAB      TAB      Ectopic      Multiple  0   Live Births  1            Home Medications    Prior to Admission medications   Medication Sig Start Date End Date Taking? Authorizing Provider  acetaminophen (TYLENOL) 500 MG tablet Take 1,000 mg by mouth daily as needed for moderate pain.    Yes [provider]  cholestyramine light (PREVALITE) 4 g packet Take 1 packet (4 g total) by mouth 3 (three) times daily. Patient taking differently: Take 4 g by mouth 2 (two) times daily as needed (diarrhea).  03/14/17  Yes Lavina Hamman, MD  citalopram (CELEXA) 20 MG tablet Take 1 tablet (20 mg total) by mouth daily. 05/13/17  Yes Henson, Vickie L, NP-C  cyclobenzaprine (FLEXERIL) 10 MG tablet Take 1 tablet (10 mg total) by mouth 2 (two) times daily as needed for muscle spasms. 05/21/17  Yes Khatri, Hina, PA-C  EPINEPHrine (EPIPEN 2-PAK) 0.3 mg/0.3 mL IJ SOAJ injection Inject 0.3 mLs (0.3 mg total) into the muscle as needed (allergic reaction). Patient taking differently: Inject 0.3 mg into the muscle once as needed (severe allergic reaction).  03/21/17  Yes Aline August, MD  famotidine (PEPCID) 40 MG tablet Take 1 tablet (40 mg total) by mouth 2 (two) times daily. 06/21/17  Yes Recardo Evangelist, PA-C  ferrous sulfate 325 (65 FE) MG tablet Take 1 tablet (325 mg total) by mouth 2 (two) times daily with a meal. 05/13/17  Yes Henson, Vickie L, NP-C  folic acid (FOLVITE) 1 MG tablet Take 1 tablet (1 mg total) by mouth daily. 05/13/17  Yes Henson, Vickie L, NP-C  furosemide (LASIX) 40 MG tablet Take 40 mg by mouth daily as needed  for fluid.    Yes [provider]  gabapentin (NEURONTIN) 300 MG capsule Take 1 capsule (300 mg total) by mouth 2 (two) times daily. 04/03/17  Yes Henson, Vickie L, NP-C  glucose 4 GM chewable tablet Chew 1 tablet (4 g total) by mouth as needed for low blood sugar. Patient taking differently: Chew 1 tablet by mouth once as needed for low blood sugar.  08/10/14  Yes Denney, Rachelle A, CNM  insulin lispro (HUMALOG) 100 UNIT/ML injection Inject 0.02 mLs (2 Units total) into the skin 3 (three) times daily with meals. Patient taking differently: Inject 0-9 Units into the skin 3 (three) times daily with meals. Sliding scale 04/09/17  Yes Renato Shin, MD  LANTUS 100 UNIT/ML injection Inject 0.18 mLs (18 Units total) into the skin every morning. Patient taking differently: Inject 18 Units into the skin daily before breakfast.  05/07/17  Yes Renato Shin, MD  lidocaine (LIDODERM) 5 % Place 1 patch  onto the skin daily. Remove & Discard patch within 12 hours or as directed by MD Patient taking differently: Place 1 patch onto the skin daily as needed (pain). Remove & Discard patch within 12 hours or as directed by MD 05/21/17  Yes Khatri, Hina, PA-C  lidocaine (XYLOCAINE) 2 % solution Use as directed 15 mLs in the mouth or throat every 6 (six) hours as needed (stomach pain). 05/31/17  Yes Cardama, Grayce Sessions, MD  lipase/protease/amylase (CREON) 36000 UNITS CPEP capsule Take 1 capsule (36,000 Units total) by mouth 3 (three) times daily with meals. 03/14/17  Yes Lavina Hamman, MD  loperamide (IMODIUM) 2 MG capsule Take 1 capsule (2 mg total) by mouth 3 (three) times daily before meals. DO NOT TAKE IT FOR THE DAY YOU DONT HAVE A BOWEL MOVEMENT. Patient taking differently: Take 2 mg by mouth 3 (three) times daily as needed for diarrhea or loose stools.  04/03/17  Yes Henson, Vickie L, NP-C  metoCLOPramide (REGLAN) 5 MG tablet Take 5 mg by mouth every 6 (six) hours as needed for nausea or vomiting.    Yes  [provider]  sucralfate (CARAFATE) 1 GM/10ML suspension Take 10 mLs (1 g total) by mouth 4 (four) times daily -  with meals and at bedtime. 06/17/17  Yes Upstill, Shari, PA-C  cephALEXin (KEFLEX) 500 MG capsule Take 1 capsule (500 mg total) by mouth 3 (three) times daily for 10 days. 07/09/17 07/19/17  Duffy Bruce, MD  HYDROcodone-acetaminophen (NORCO/VICODIN) 5-325 MG tablet Take 1-2 tablets by mouth every 6 (six) hours as needed for moderate pain or severe pain. 07/09/17   Duffy Bruce, MD  ondansetron (ZOFRAN ODT) 4 MG disintegrating tablet Take 1 tablet (4 mg total) by mouth every 8 (eight) hours as needed for nausea or vomiting. 07/09/17   Duffy Bruce, MD  sulfamethoxazole-trimethoprim (BACTRIM DS,SEPTRA DS) 800-160 MG tablet Take 1 tablet by mouth 2 (two) times daily for 10 days. 07/09/17 07/19/17  Duffy Bruce, MD    Family History Family History  Problem Relation Age of Onset  . Diabetes Mother   . Hypertension Father     Social History Social History   Tobacco Use  . Smoking status: Never Smoker  . Smokeless tobacco: Never Used  Substance Use Topics  . Alcohol use: No    Alcohol/week: 0.0 oz    Frequency: Never  . Drug use: No     Allergies   Other and Lactose intolerance (gi)   Review of Systems Review of Systems  Constitutional: Positive for chills and fatigue. Negative for fever.  HENT: Negative for congestion and rhinorrhea.   Eyes: Negative for visual disturbance.  Respiratory: Negative for cough, shortness of breath and wheezing.   Cardiovascular: Negative for chest pain and leg swelling.  Gastrointestinal: Negative for abdominal pain, diarrhea, nausea and vomiting.  Genitourinary: Negative for dysuria and flank pain.  Musculoskeletal: Negative for neck pain and neck stiffness.  Skin: Positive for rash and wound.  Allergic/Immunologic: Negative for immunocompromised state.  Neurological: Positive for weakness and light-headedness.  Negative for syncope and headaches.  All other systems reviewed and are negative.    Physical Exam Updated Vital Signs BP (!) 146/107   Pulse (!) 110   Temp 98 F (36.7 C) (Oral)   Resp 11   Ht 5' 7"  (1.702 m)   Wt 59 kg (130 lb)   SpO2 99%   BMI 20.36 kg/m   Physical Exam  Constitutional: She is oriented to person, place, and  time. She appears well-developed and well-nourished. She appears ill. No distress.  HENT:  Head: Normocephalic and atraumatic.  Dry MM  Eyes: Conjunctivae are normal.  Neck: Neck supple.  Cardiovascular: Normal rate, regular rhythm and normal heart sounds. Exam reveals no friction rub.  No murmur heard. Pulmonary/Chest: Effort normal and breath sounds normal. No respiratory distress. She has no wheezes. She has no rales.  Abdominal: She exhibits no distension.  Genitourinary:  Genitourinary Comments: Penrose drain is in place over the right mid and upper buttocks.  There is mild surrounding induration and tenderness.  Small amount of purulent discharge noted.  No crepitance.  Musculoskeletal: She exhibits no edema.  Neurological: She is alert and oriented to person, place, and time. She exhibits normal muscle tone.  Skin: Skin is warm. Capillary refill takes less than 2 seconds.  Psychiatric: She has a normal mood and affect.  Nursing note and vitals reviewed.    ED Treatments / Results  Labs (all labs ordered are listed, but only abnormal results are displayed) Labs Reviewed  COMPREHENSIVE METABOLIC PANEL - Abnormal; Notable for the following components:      Result Value   Chloride 100 (*)    Creatinine, Ser 0.38 (*)    Total Protein 8.6 (*)    All other components within normal limits  CBC WITH DIFFERENTIAL/PLATELET - Abnormal; Notable for the following components:   Hemoglobin 11.7 (*)    HCT 35.4 (*)    All other components within normal limits  URINALYSIS, ROUTINE W REFLEX MICROSCOPIC - Abnormal; Notable for the following components:     Color, Urine STRAW (*)    Glucose, UA 50 (*)    Hgb urine dipstick SMALL (*)    Leukocytes, UA TRACE (*)    Squamous Epithelial / LPF 0-5 (*)    All other components within normal limits  AEROBIC CULTURE (SUPERFICIAL SPECIMEN)  CULTURE, BLOOD (ROUTINE X 2)  CULTURE, BLOOD (ROUTINE X 2)  I-STAT CG4 LACTIC ACID, ED  I-STAT BETA HCG BLOOD, ED ([facility], [facility], [facility] ONLY)    EKG EKG Interpretation  Date/Time:  Wednesday July 08 2017 23:44:44 EDT Ventricular Rate:  108 PR Interval:    QRS Duration: 76 QT Interval:  340 QTC Calculation: 456 R Axis:   67 Text Interpretation:  Sinus tachycardia Since last EKG, no significant change Confirmed by Duffy Bruce [phone]) on 07/09/2017 3:19:47 AM   Radiology Ct Pelvis W Contrast  Result Date: 07/09/2017 CLINICAL DATA:  Gluteal abscess.  Drain in place. EXAM: CT PELVIS WITH CONTRAST TECHNIQUE: Multidetector CT imaging of the pelvis was performed using the standard protocol following the bolus administration of intravenous contrast. CONTRAST:  36m ISOVUE-300 IOPAMIDOL (ISOVUE-300) INJECTION 61% COMPARISON:  Abdominopelvic CT 06/19/2017 FINDINGS: Urinary Tract: Distal ureters are decompressed. Urinary bladder is mildly distended. No bladder wall thickening. Bowel: Colonic wall thickening on prior exam has diminished in the interim, with persistent sigmoid and rectal wall thickening. No evidence of acute bowel inflammation. Vascular/Lymphatic: Few prominent inguinal nodes are likely reactive. No acute vascular findings. Reproductive:  Uterus and adnexa are unremarkable. Other: Right gluteal drainage catheter is unchanged in position. Unchanged appearance of the adjacent soft tissue density without dominant fluid collection. Soft tissue fullness tracks in the gluteal crease to the anal junction without significant no evidence of new fluid collection. No significant change from prior exam or progressive inflammatory change. No intrapelvic fluid collection or  abscess. Musculoskeletal: There are no acute or suspicious osseous abnormalities. IMPRESSION: 1. Right gluteal drainage  catheter in place, unchanged in appearance with associated soft tissue density in the subcutaneous tissues tracking to the anorectal region, suspicious for anocutaneous fistula. Overall no significant change from prior. No increased inflammation or new fluid collection. 2. Persistent but improved wall thickening of the rectosigmoid colon. Electronically Signed   By: Jeb Levering M.D.   On: 07/09/2017 05:15    Procedures .Critical Care Performed by: Duffy Bruce, MD Authorized by: Duffy Bruce, MD   Critical care provider statement:    Critical care time (minutes):  35   Critical care time was exclusive of:  Separately billable procedures and treating other patients and teaching time   Critical care was necessary to treat or prevent imminent or life-threatening deterioration of the following conditions:  Sepsis   Critical care was time spent personally by me on the following activities:  Development of treatment plan with patient or surrogate, discussions with consultants, evaluation of patient's response to treatment, examination of patient, obtaining history from patient or surrogate, ordering and performing treatments and interventions, ordering and review of laboratory studies, ordering and review of radiographic studies, pulse oximetry, re-evaluation of patient's condition and review of old charts   I assumed direction of critical care for this patient from another provider in my specialty: no     (including critical care time)  Medications Ordered in ED Medications  lactated ringers infusion ( Intravenous Stopped 07/09/17 0641)  piperacillin-tazobactam (ZOSYN) IVPB 3.375 g (0 g Intravenous Stopped 07/09/17 0125)  vancomycin (VANCOCIN) IVPB 1000 mg/200 mL premix (0 mg Intravenous Stopped 07/09/17 0155)  sodium chloride 0.9 % bolus 1,000 mL (0 mLs Intravenous Stopped  07/09/17 0358)    And  sodium chloride 0.9 % bolus 1,000 mL (0 mLs Intravenous Stopped 07/09/17 0358)  morphine 4 MG/ML injection (  Given 07/09/17 0306)  ondansetron (ZOFRAN) 4 MG/2ML injection (4 mg  Given 07/09/17 0306)  fentaNYL (SUBLIMAZE) injection 50 mcg (50 mcg Intravenous Given 07/09/17 0136)  iopamidol (ISOVUE-300) 61 % injection 100 mL (80 mLs Intravenous Contrast Given 07/09/17 0449)  oxyCODONE-acetaminophen (PERCOCET/ROXICET) 5-325 MG per tablet 2 tablet (2 tablets Oral Given 07/09/17 0621)  ondansetron (ZOFRAN) injection 4 mg (4 mg Intravenous Given 07/09/17 0555)     Initial Impression / Assessment and Plan / ED Course  I have reviewed the triage vital signs and the nursing notes.  Pertinent labs & imaging results that were available during my care of the patient were reviewed by me and considered in my medical decision making (see chart for details).  Clinical Course as of Jul 10 714  Wed Jul 08, 2017  2332 27 yo F here with tachycardia, hypotension, increased drainage from R buttocks wound. Concern for sepsis 2/2 cellulitis vs recurrent abscess. CODE SEPSIS initiated, with 30 cc/kg and Vanc/Zosyn. Pt in agreement. Mentating well, otherwise non-toxic.   [CI]    Clinical Course User Index [CI] Duffy Bruce, MD    Lab work returned and is very reassuring.  White count normal.  Lactic acid is completely normal as well.  The patient had complete resolution of her low blood pressure, and it is questionable whether this was secondary to blood pressure cuff at her versus dehydration versus sepsis, though sepsis is significantly less likely in the setting of her normal lab work and appearance.  CT scan obtained and shows no worsening abscess or surrounding inflammation or cellulitis.  She feels markedly improved after fluids.  She is been monitored for multiple hours in the ED with no recurrence of  hypotension.  She had some mild tachycardia, but this is a well-documented baseline for  her, and her baseline heart rate is in the 100-110s.  She feels improved.  She is tolerating p.o.  Given reassuring labs, normal lactic acid, reassuring CT scan, and stable vitals, discussed management options with the patient and she would prefer outpatient management.  Will put her on clindamycin just in case she has an early associated cellulitis, and refer her back to her surgeon.  Final Clinical Impressions(s) / ED Diagnoses   Final diagnoses:  Cellulitis and abscess of buttock    ED Discharge Orders        Ordered    cephALEXin (KEFLEX) 500 MG capsule  3 times daily     07/09/17 0625    sulfamethoxazole-trimethoprim (BACTRIM DS,SEPTRA DS) 800-160 MG tablet  2 times daily     07/09/17 0625    HYDROcodone-acetaminophen (NORCO/VICODIN) 5-325 MG tablet  Every 6 hours PRN     07/09/17 0625    ondansetron (ZOFRAN ODT) 4 MG disintegrating tablet  Every 8 hours PRN     07/09/17 5056       Duffy Bruce, MD 07/09/17 [phone]

## 2017-07-09 ENCOUNTER — Emergency Department (HOSPITAL_COMMUNITY): Payer: 59

## 2017-07-09 ENCOUNTER — Encounter (HOSPITAL_COMMUNITY): Payer: Self-pay

## 2017-07-09 ENCOUNTER — Other Ambulatory Visit: Payer: Self-pay

## 2017-07-09 ENCOUNTER — Inpatient Hospital Stay (HOSPITAL_COMMUNITY)
Admission: EM | Admit: 2017-07-09 | Discharge: 2017-07-13 | DRG: 074 | Disposition: A | Discharge status: Home / Self Care | Payer: 59 | Attending: Family Medicine | Admitting: Family Medicine

## 2017-07-09 DIAGNOSIS — Z9119 Patient's noncompliance with other medical treatment and regimen: Secondary | ICD-10-CM

## 2017-07-09 DIAGNOSIS — I10 Essential (primary) hypertension: Secondary | ICD-10-CM | POA: Diagnosis present

## 2017-07-09 DIAGNOSIS — K3184 Gastroparesis: Secondary | ICD-10-CM | POA: Diagnosis present

## 2017-07-09 DIAGNOSIS — IMO0001 Reserved for inherently not codable concepts without codable children: Secondary | ICD-10-CM

## 2017-07-09 DIAGNOSIS — L0231 Cutaneous abscess of buttock: Secondary | ICD-10-CM | POA: Diagnosis not present

## 2017-07-09 DIAGNOSIS — Z9049 Acquired absence of other specified parts of digestive tract: Secondary | ICD-10-CM

## 2017-07-09 DIAGNOSIS — G8929 Other chronic pain: Secondary | ICD-10-CM | POA: Diagnosis present

## 2017-07-09 DIAGNOSIS — R1115 Cyclical vomiting syndrome unrelated to migraine: Secondary | ICD-10-CM

## 2017-07-09 DIAGNOSIS — Z794 Long term (current) use of insulin: Secondary | ICD-10-CM

## 2017-07-09 DIAGNOSIS — R109 Unspecified abdominal pain: Secondary | ICD-10-CM | POA: Diagnosis not present

## 2017-07-09 DIAGNOSIS — R110 Nausea: Secondary | ICD-10-CM | POA: Diagnosis not present

## 2017-07-09 DIAGNOSIS — N739 Female pelvic inflammatory disease, unspecified: Secondary | ICD-10-CM | POA: Diagnosis present

## 2017-07-09 DIAGNOSIS — R112 Nausea with vomiting, unspecified: Secondary | ICD-10-CM | POA: Diagnosis present

## 2017-07-09 DIAGNOSIS — IMO0002 Reserved for concepts with insufficient information to code with codable children: Secondary | ICD-10-CM

## 2017-07-09 DIAGNOSIS — E1043 Type 1 diabetes mellitus with diabetic autonomic (poly)neuropathy: Principal | ICD-10-CM | POA: Diagnosis present

## 2017-07-09 DIAGNOSIS — D649 Anemia, unspecified: Secondary | ICD-10-CM | POA: Diagnosis present

## 2017-07-09 DIAGNOSIS — M797 Fibromyalgia: Secondary | ICD-10-CM | POA: Diagnosis present

## 2017-07-09 DIAGNOSIS — L03317 Cellulitis of buttock: Secondary | ICD-10-CM | POA: Diagnosis present

## 2017-07-09 DIAGNOSIS — I959 Hypotension, unspecified: Secondary | ICD-10-CM | POA: Diagnosis present

## 2017-07-09 DIAGNOSIS — E119 Type 2 diabetes mellitus without complications: Secondary | ICD-10-CM

## 2017-07-09 DIAGNOSIS — G43A1 Cyclical vomiting, intractable: Secondary | ICD-10-CM | POA: Diagnosis not present

## 2017-07-09 DIAGNOSIS — E1142 Type 2 diabetes mellitus with diabetic polyneuropathy: Secondary | ICD-10-CM

## 2017-07-09 DIAGNOSIS — Z8249 Family history of ischemic heart disease and other diseases of the circulatory system: Secondary | ICD-10-CM

## 2017-07-09 DIAGNOSIS — Z833 Family history of diabetes mellitus: Secondary | ICD-10-CM

## 2017-07-09 DIAGNOSIS — E1165 Type 2 diabetes mellitus with hyperglycemia: Secondary | ICD-10-CM

## 2017-07-09 LAB — URINALYSIS, ROUTINE W REFLEX MICROSCOPIC
Bacteria, UA: NONE SEEN
Bilirubin Urine: NEGATIVE
Glucose, UA: 50 mg/dL — AB
Ketones, ur: NEGATIVE mg/dL
Nitrite: NEGATIVE
Protein, ur: NEGATIVE mg/dL
Specific Gravity, Urine: 1.026 (ref 1.005–1.030)
pH: 6 (ref 5.0–8.0)

## 2017-07-09 LAB — CBC WITH DIFFERENTIAL/PLATELET
Basophils Absolute: 0 10*3/uL (ref 0.0–0.1)
Basophils Relative: 1 %
Eosinophils Absolute: 0.1 10*3/uL (ref 0.0–0.7)
Eosinophils Relative: 2 %
HCT: 35.4 % — ABNORMAL LOW (ref 36.0–46.0)
Hemoglobin: 11.7 g/dL — ABNORMAL LOW (ref 12.0–15.0)
Lymphocytes Relative: 49 %
Lymphs Abs: 3.2 10*3/uL (ref 0.7–4.0)
MCH: 28.7 pg (ref 26.0–34.0)
MCHC: 33.1 g/dL (ref 30.0–36.0)
MCV: 86.8 fL (ref 78.0–100.0)
Monocytes Absolute: 0.6 10*3/uL (ref 0.1–1.0)
Monocytes Relative: 9 %
Neutro Abs: 2.5 10*3/uL (ref 1.7–7.7)
Neutrophils Relative %: 39 %
Platelets: 314 10*3/uL (ref 150–400)
RBC: 4.08 MIL/uL (ref 3.87–5.11)
RDW: 13.1 % (ref 11.5–15.5)
WBC: 6.4 10*3/uL (ref 4.0–10.5)

## 2017-07-09 LAB — COMPREHENSIVE METABOLIC PANEL
ALT: 25 U/L (ref 14–54)
AST: 33 U/L (ref 15–41)
Albumin: 3.9 g/dL (ref 3.5–5.0)
Alkaline Phosphatase: 91 U/L (ref 38–126)
Anion gap: 10 (ref 5–15)
BUN: 14 mg/dL (ref 6–20)
CO2: 30 mmol/L (ref 22–32)
Calcium: 9.7 mg/dL (ref 8.9–10.3)
Chloride: 100 mmol/L — ABNORMAL LOW (ref 101–111)
Creatinine, Ser: 0.38 mg/dL — ABNORMAL LOW (ref 0.44–1.00)
GFR calc Af Amer: >60 mL/min (ref >60)
GFR calc non Af Amer: >60 mL/min (ref >60)
Glucose, Bld: 93 mg/dL (ref 65–99)
Potassium: 4.2 mmol/L (ref 3.5–5.1)
Sodium: 140 mmol/L (ref 135–145)
Total Bilirubin: 0.5 mg/dL (ref 0.3–1.2)
Total Protein: 8.6 g/dL — ABNORMAL HIGH (ref 6.5–8.1)

## 2017-07-09 LAB — I-STAT BETA HCG BLOOD, ED (MC, WL, AP ONLY): I-stat hCG, quantitative: <5 m[IU]/mL (ref <5)

## 2017-07-09 LAB — I-STAT CG4 LACTIC ACID, ED: Lactic Acid, Venous: 0.88 mmol/L (ref 0.5–1.9)

## 2017-07-09 IMAGING — CT CT PELVIS W/ CM
2 of 3 series · 16 of 46 positions shown, 18 images · IV contrast (ISOVUE)
Comparison: Abdominopelvic CT [DATE]

CLINICAL DATA: Gluteal abscess.  Drain in place.

EXAM:
CT PELVIS WITH CONTRAST
TECHNIQUE: Multidetector CT imaging of the pelvis was performed using the
standard protocol following the bolus administration of intravenous
contrast.
CONTRAST:  80mL [5L] IOPAMIDOL ([5L]) INJECTION 61%

[Series 3: axial st · axial · 0.79mm/px · z∈[+1138,+1370]mm · 13 of 134 slices shown, 15 images]
[im 9/134  soft-tissue]
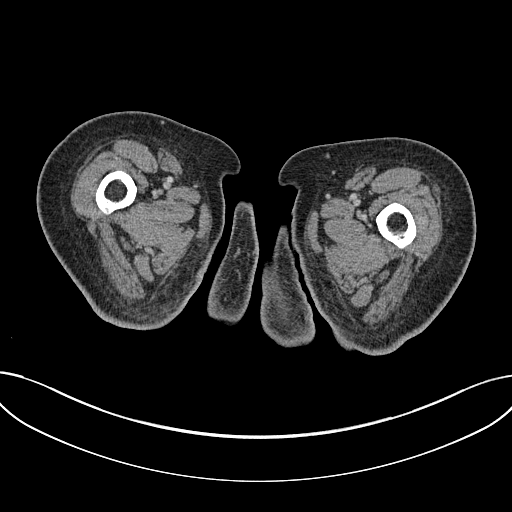
[im 9/134  bone]
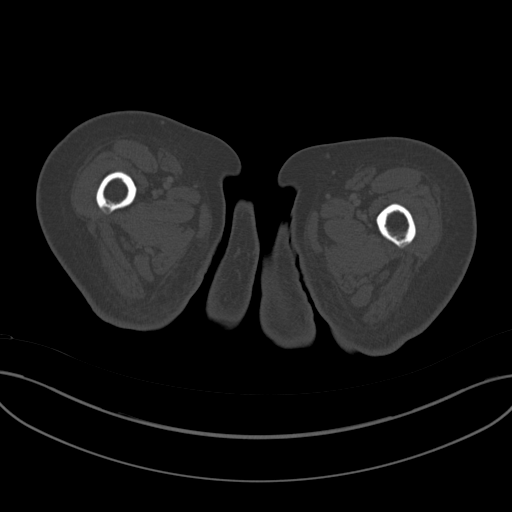
[im 18/134  soft-tissue]
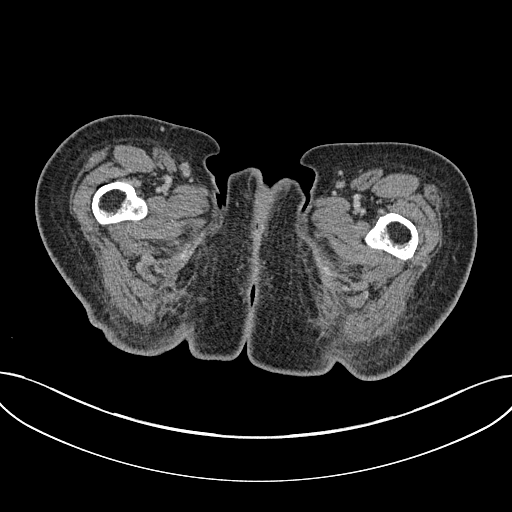
[im 26/134  soft-tissue]
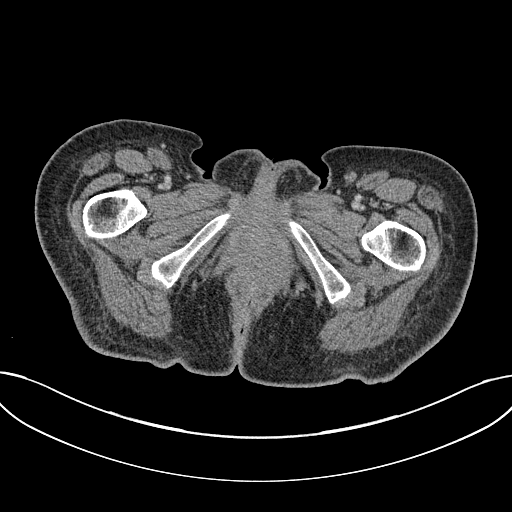
[im 39/134  soft-tissue]
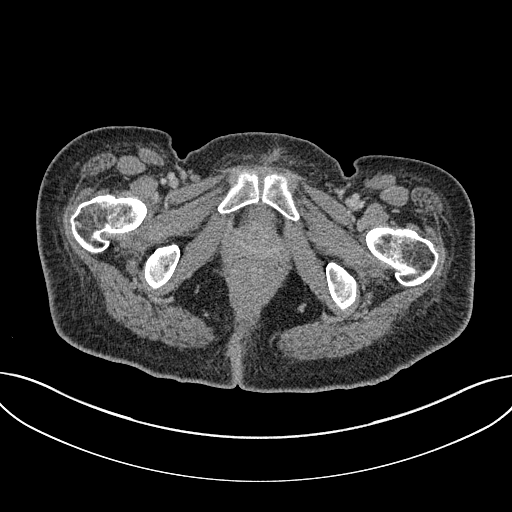
[im 48/134  soft-tissue]
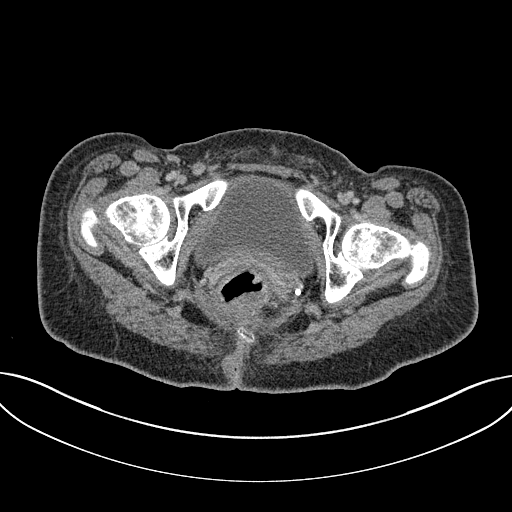
[im 56/134  soft-tissue]
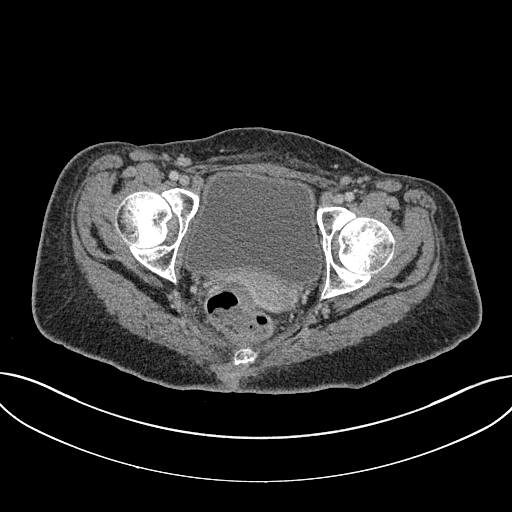
[im 69/134  soft-tissue]
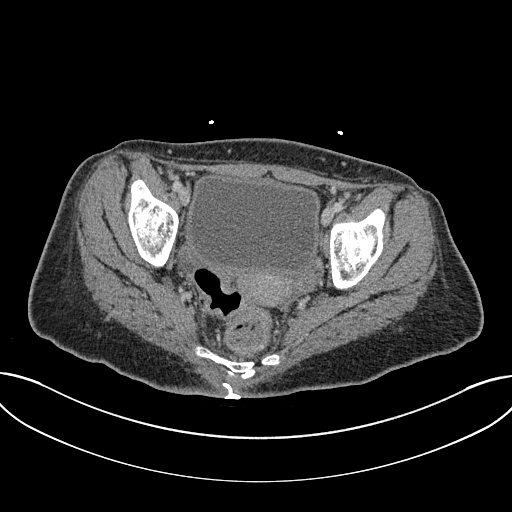
[im 78/134  soft-tissue]
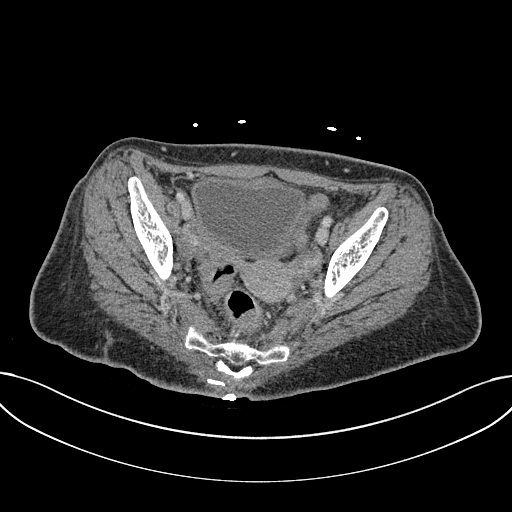
[im 86/134  soft-tissue]
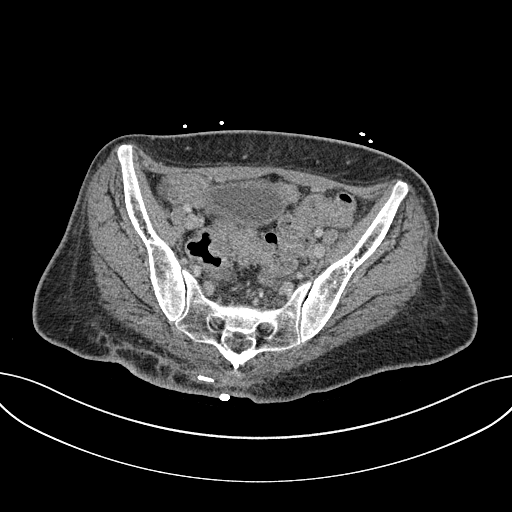
[im 86/134  bone]
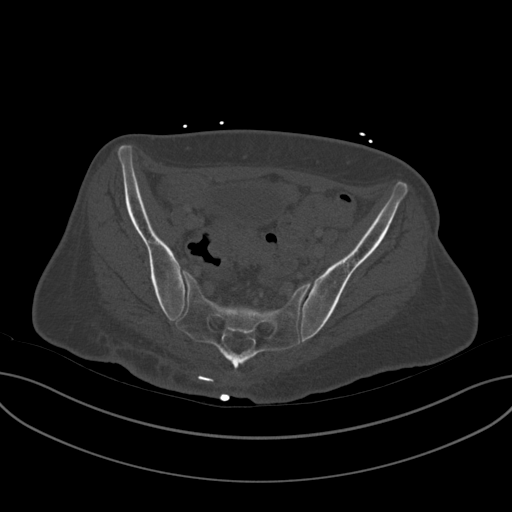
[im 95/134  soft-tissue]
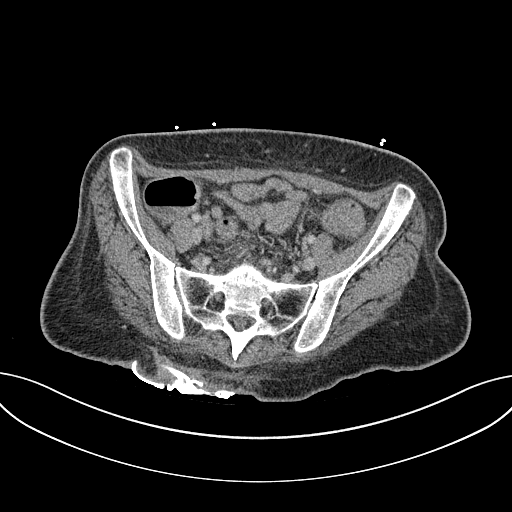
[im 108/134  soft-tissue]
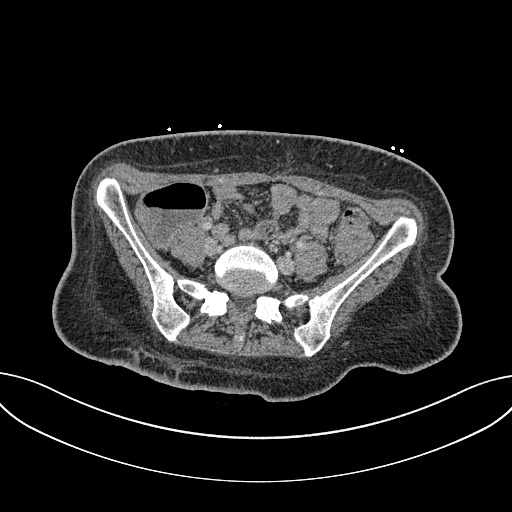
[im 116/134  soft-tissue]
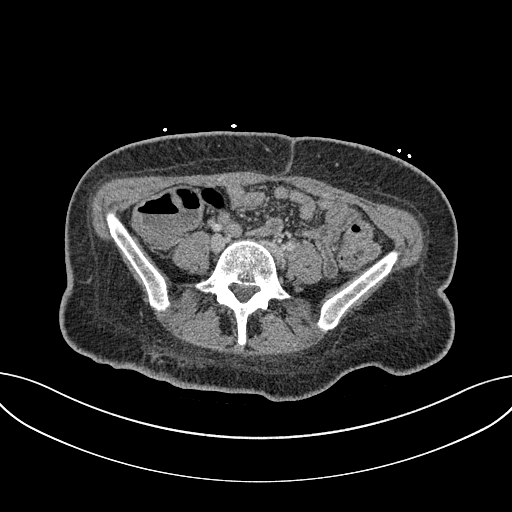
[im 125/134  soft-tissue]
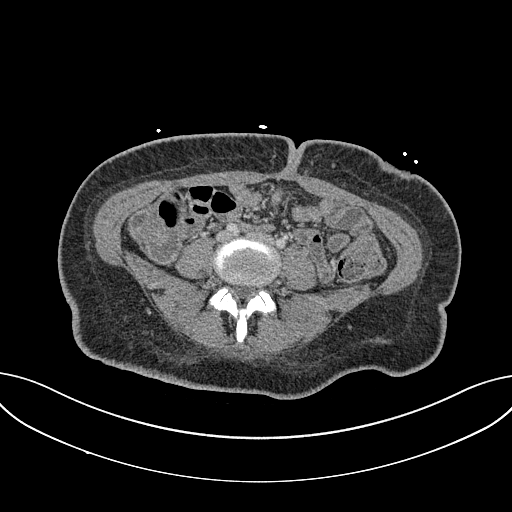

[Series 8: coronal st · coronal · 0.52mm/px · 3 of 99 slices shown]
[im 33/99  soft-tissue]
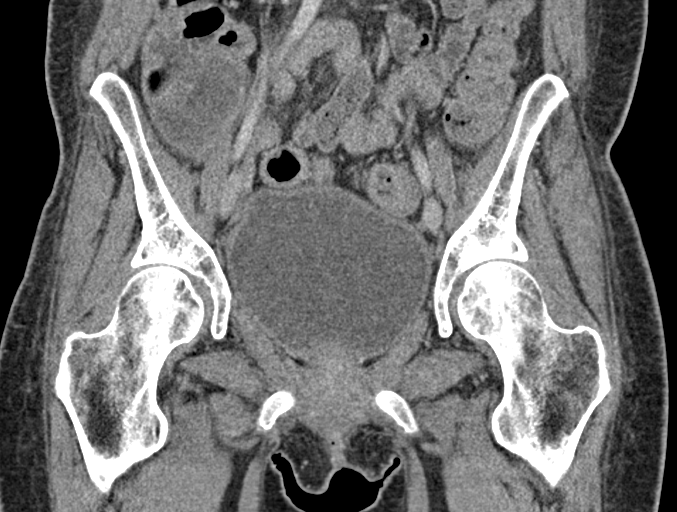
[im 44/99  soft-tissue]
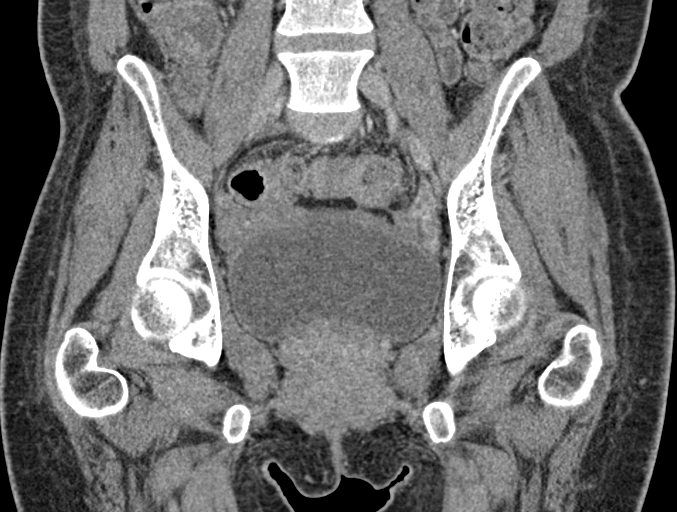
[im 55/99  soft-tissue]
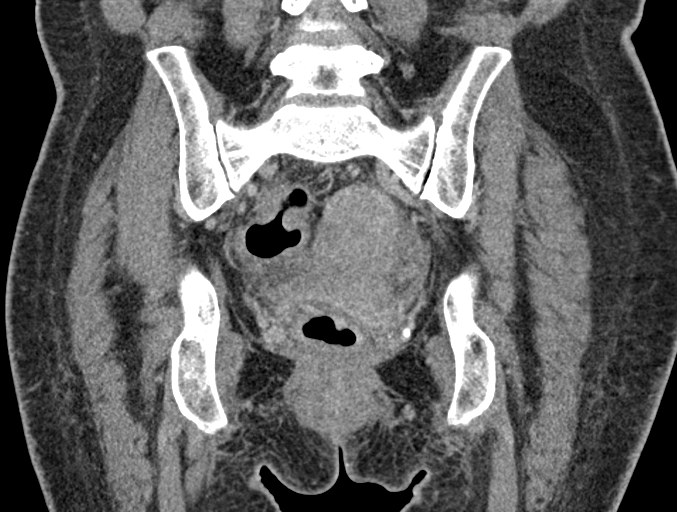

[16 of 46 positions shown; findings below may reference images not displayed]

FINDINGS: Urinary Tract: Distal ureters are decompressed. Urinary bladder is
mildly distended. No bladder wall thickening.

Bowel: Colonic wall thickening on prior exam has diminished in the
interim, with persistent sigmoid and rectal wall thickening. No
evidence of acute bowel inflammation.

Vascular/Lymphatic: Few prominent inguinal nodes are likely
reactive. No acute vascular findings.

Reproductive:  Uterus and adnexa are unremarkable.

Other: Right gluteal drainage catheter is unchanged in position.
Unchanged appearance of the adjacent soft tissue density without
dominant fluid collection. Soft tissue fullness tracks in the
gluteal crease to the anal junction without significant no evidence
of new fluid collection. No significant change from prior exam or
progressive inflammatory change. No intrapelvic fluid collection or
abscess.

Musculoskeletal: There are no acute or suspicious osseous
abnormalities.
IMPRESSION: 1. Right gluteal drainage catheter in place, unchanged in appearance
with associated soft tissue density in the subcutaneous tissues
tracking to the anorectal region, suspicious for anocutaneous
fistula. Overall no significant change from prior. No increased
inflammation or new fluid collection.
2. Persistent but improved wall thickening of the rectosigmoid
colon.

## 2017-07-09 MED ORDER — FENTANYL CITRATE (PF) 100 MCG/2ML IJ SOLN
50.0000 ug | Freq: Once | INTRAMUSCULAR | Status: AC
  Administered 2017-07-09: 50 ug via INTRAVENOUS

## 2017-07-09 MED ORDER — SODIUM CHLORIDE 0.9 % IV BOLUS
1000.0000 mL | Freq: Once | INTRAVENOUS | Status: AC
  Administered 2017-07-10: 1000 mL via INTRAVENOUS

## 2017-07-09 MED ORDER — ONDANSETRON HCL 4 MG/2ML IJ SOLN
INTRAMUSCULAR | Status: AC
  Administered 2017-07-09: 4 mg
  Filled 2017-07-09: qty 2

## 2017-07-09 MED ORDER — SULFAMETHOXAZOLE-TRIMETHOPRIM 800-160 MG PO TABS: 1.0000 | ORAL_TABLET | Freq: Two times a day (BID) | ORAL | 0 refills | Status: DC

## 2017-07-09 MED ORDER — OXYCODONE-ACETAMINOPHEN 5-325 MG PO TABS
2.0000 | ORAL_TABLET | Freq: Once | ORAL | Status: AC
Start: 2017-07-09 — End: 2017-07-09
  Administered 2017-07-09: 2 via ORAL
  Filled 2017-07-09: qty 2

## 2017-07-09 MED ORDER — ONDANSETRON HCL 4 MG/2ML IJ SOLN
4.0000 mg | Freq: Once | INTRAMUSCULAR | Status: AC
  Administered 2017-07-09: 4 mg via INTRAVENOUS
  Filled 2017-07-09: qty 2

## 2017-07-09 MED ORDER — MORPHINE SULFATE (PF) 4 MG/ML IV SOLN
INTRAVENOUS | Status: AC
  Administered 2017-07-09: 03:00:00
  Filled 2017-07-09: qty 1

## 2017-07-09 MED ORDER — CEPHALEXIN 500 MG PO CAPS: 500.0000 mg | ORAL_CAPSULE | Freq: Three times a day (TID) | ORAL | 0 refills | Status: DC

## 2017-07-09 MED ORDER — IOPAMIDOL (ISOVUE-300) INJECTION 61%
100.0000 mL | Freq: Once | INTRAVENOUS | Status: AC | PRN
  Administered 2017-07-09: 80 mL via INTRAVENOUS

## 2017-07-09 MED ORDER — LACTATED RINGERS IV SOLN
INTRAVENOUS | Status: DC
  Administered 2017-07-09: 04:00:00 via INTRAVENOUS

## 2017-07-09 MED ORDER — IOPAMIDOL (ISOVUE-300) INJECTION 61%
INTRAVENOUS | Status: AC
  Filled 2017-07-09: qty 100

## 2017-07-09 MED ORDER — ONDANSETRON HCL 4 MG/2ML IJ SOLN
4.0000 mg | Freq: Once | INTRAMUSCULAR | Status: AC
  Administered 2017-07-10: 4 mg via INTRAVENOUS
  Filled 2017-07-09: qty 2

## 2017-07-09 MED ORDER — ONDANSETRON 4 MG PO TBDP: 4.0000 mg | ORAL_TABLET | Freq: Three times a day (TID) | ORAL | 0 refills | Status: DC | PRN

## 2017-07-09 MED ORDER — HYDROCODONE-ACETAMINOPHEN 5-325 MG PO TABS: 1.0000 | ORAL_TABLET | Freq: Four times a day (QID) | ORAL | 0 refills | Status: DC | PRN

## 2017-07-09 NOTE — ED Triage Notes (Signed)
Pt from home with c/o abdominal pain and nausea with emesis (10 episodes). Pt states she was seen for same and rectal pain yesterday. Pt states she can not hold her medications or anything else down. Pt is not febrile but is hypotensive and tachycardic at time of assessment.

## 2017-07-09 NOTE — ED Notes (Signed)
IV attempted x2 by 2 nurses, unsuccessful. EDP to put in USIV

## 2017-07-10 ENCOUNTER — Other Ambulatory Visit: Payer: Self-pay

## 2017-07-10 ENCOUNTER — Encounter (HOSPITAL_COMMUNITY): Payer: Self-pay | Admitting: Family Medicine

## 2017-07-10 ENCOUNTER — Emergency Department (HOSPITAL_COMMUNITY): Payer: 59

## 2017-07-10 DIAGNOSIS — G8929 Other chronic pain: Secondary | ICD-10-CM | POA: Diagnosis not present

## 2017-07-10 DIAGNOSIS — D649 Anemia, unspecified: Secondary | ICD-10-CM | POA: Diagnosis not present

## 2017-07-10 DIAGNOSIS — R109 Unspecified abdominal pain: Secondary | ICD-10-CM | POA: Diagnosis not present

## 2017-07-10 DIAGNOSIS — L0231 Cutaneous abscess of buttock: Secondary | ICD-10-CM | POA: Diagnosis not present

## 2017-07-10 DIAGNOSIS — Z794 Long term (current) use of insulin: Secondary | ICD-10-CM

## 2017-07-10 DIAGNOSIS — R0602 Shortness of breath: Secondary | ICD-10-CM | POA: Diagnosis not present

## 2017-07-10 DIAGNOSIS — R112 Nausea with vomiting, unspecified: Secondary | ICD-10-CM | POA: Diagnosis not present

## 2017-07-10 DIAGNOSIS — L03317 Cellulitis of buttock: Secondary | ICD-10-CM

## 2017-07-10 DIAGNOSIS — R079 Chest pain, unspecified: Secondary | ICD-10-CM | POA: Diagnosis not present

## 2017-07-10 DIAGNOSIS — E119 Type 2 diabetes mellitus without complications: Secondary | ICD-10-CM | POA: Diagnosis not present

## 2017-07-10 LAB — CBC
HCT: 31.6 % — ABNORMAL LOW (ref 36.0–46.0)
Hemoglobin: 10.6 g/dL — ABNORMAL LOW (ref 12.0–15.0)
MCH: 28.6 pg (ref 26.0–34.0)
MCHC: 33.5 g/dL (ref 30.0–36.0)
MCV: 85.2 fL (ref 78.0–100.0)
Platelets: 280 10*3/uL (ref 150–400)
RBC: 3.71 MIL/uL — ABNORMAL LOW (ref 3.87–5.11)
RDW: 13 % (ref 11.5–15.5)
WBC: 4.4 10*3/uL (ref 4.0–10.5)

## 2017-07-10 LAB — BASIC METABOLIC PANEL
Anion gap: 10 (ref 5–15)
BUN: 8 mg/dL (ref 6–20)
CO2: 24 mmol/L (ref 22–32)
Calcium: 8.8 mg/dL — ABNORMAL LOW (ref 8.9–10.3)
Chloride: 105 mmol/L (ref 101–111)
Creatinine, Ser: 0.48 mg/dL (ref 0.44–1.00)
GFR calc Af Amer: >60 mL/min (ref >60)
GFR calc non Af Amer: >60 mL/min (ref >60)
Glucose, Bld: 143 mg/dL — ABNORMAL HIGH (ref 65–99)
Potassium: 3.6 mmol/L (ref 3.5–5.1)
Sodium: 139 mmol/L (ref 135–145)

## 2017-07-10 LAB — COMPREHENSIVE METABOLIC PANEL
ALT: 21 U/L (ref 14–54)
AST: 19 U/L (ref 15–41)
Albumin: 3.5 g/dL (ref 3.5–5.0)
Alkaline Phosphatase: 81 U/L (ref 38–126)
Anion gap: 11 (ref 5–15)
BUN: 9 mg/dL (ref 6–20)
CO2: 25 mmol/L (ref 22–32)
Calcium: 9.4 mg/dL (ref 8.9–10.3)
Chloride: 101 mmol/L (ref 101–111)
Creatinine, Ser: 0.54 mg/dL (ref 0.44–1.00)
GFR calc Af Amer: >60 mL/min (ref >60)
GFR calc non Af Amer: >60 mL/min (ref >60)
Glucose, Bld: 204 mg/dL — ABNORMAL HIGH (ref 65–99)
Potassium: 4.3 mmol/L (ref 3.5–5.1)
Sodium: 137 mmol/L (ref 135–145)
Total Bilirubin: 0.8 mg/dL (ref 0.3–1.2)
Total Protein: 8.1 g/dL (ref 6.5–8.1)

## 2017-07-10 LAB — URINALYSIS, ROUTINE W REFLEX MICROSCOPIC
Bacteria, UA: NONE SEEN
Bilirubin Urine: NEGATIVE
Glucose, UA: NEGATIVE mg/dL
Ketones, ur: 20 mg/dL — AB
Leukocytes, UA: NEGATIVE
Nitrite: NEGATIVE
Protein, ur: 30 mg/dL — AB
Specific Gravity, Urine: 1.013 (ref 1.005–1.030)
pH: 5 (ref 5.0–8.0)

## 2017-07-10 LAB — I-STAT BETA HCG BLOOD, ED (MC, WL, AP ONLY): I-stat hCG, quantitative: <5 m[IU]/mL (ref <5)

## 2017-07-10 LAB — GLUCOSE, CAPILLARY
Glucose-Capillary: 126 mg/dL — ABNORMAL HIGH (ref 65–99)
Glucose-Capillary: 112 mg/dL — ABNORMAL HIGH (ref 65–99)
Glucose-Capillary: 123 mg/dL — ABNORMAL HIGH (ref 65–99)
Glucose-Capillary: 200 mg/dL — ABNORMAL HIGH (ref 65–99)

## 2017-07-10 LAB — I-STAT CG4 LACTIC ACID, ED: Lactic Acid, Venous: 1.16 mmol/L (ref 0.5–1.9)

## 2017-07-10 LAB — PROTIME-INR
INR: 1.09
Prothrombin Time: 14.1 seconds (ref 11.4–15.2)

## 2017-07-10 LAB — CBG MONITORING, ED
Glucose-Capillary: 181 mg/dL — ABNORMAL HIGH (ref 65–99)
Glucose-Capillary: 196 mg/dL — ABNORMAL HIGH (ref 65–99)

## 2017-07-10 LAB — LIPASE, BLOOD: Lipase: 20 U/L (ref 11–51)

## 2017-07-10 IMAGING — CR DG CHEST 2V
2 series · 2 of 2 positions shown · non-contrast
Comparison: Chest CT [DATE]

CLINICAL DATA: Nausea, chest pain, shortness of breath.

EXAM:
CHEST - 2 VIEW

[w chest lat]
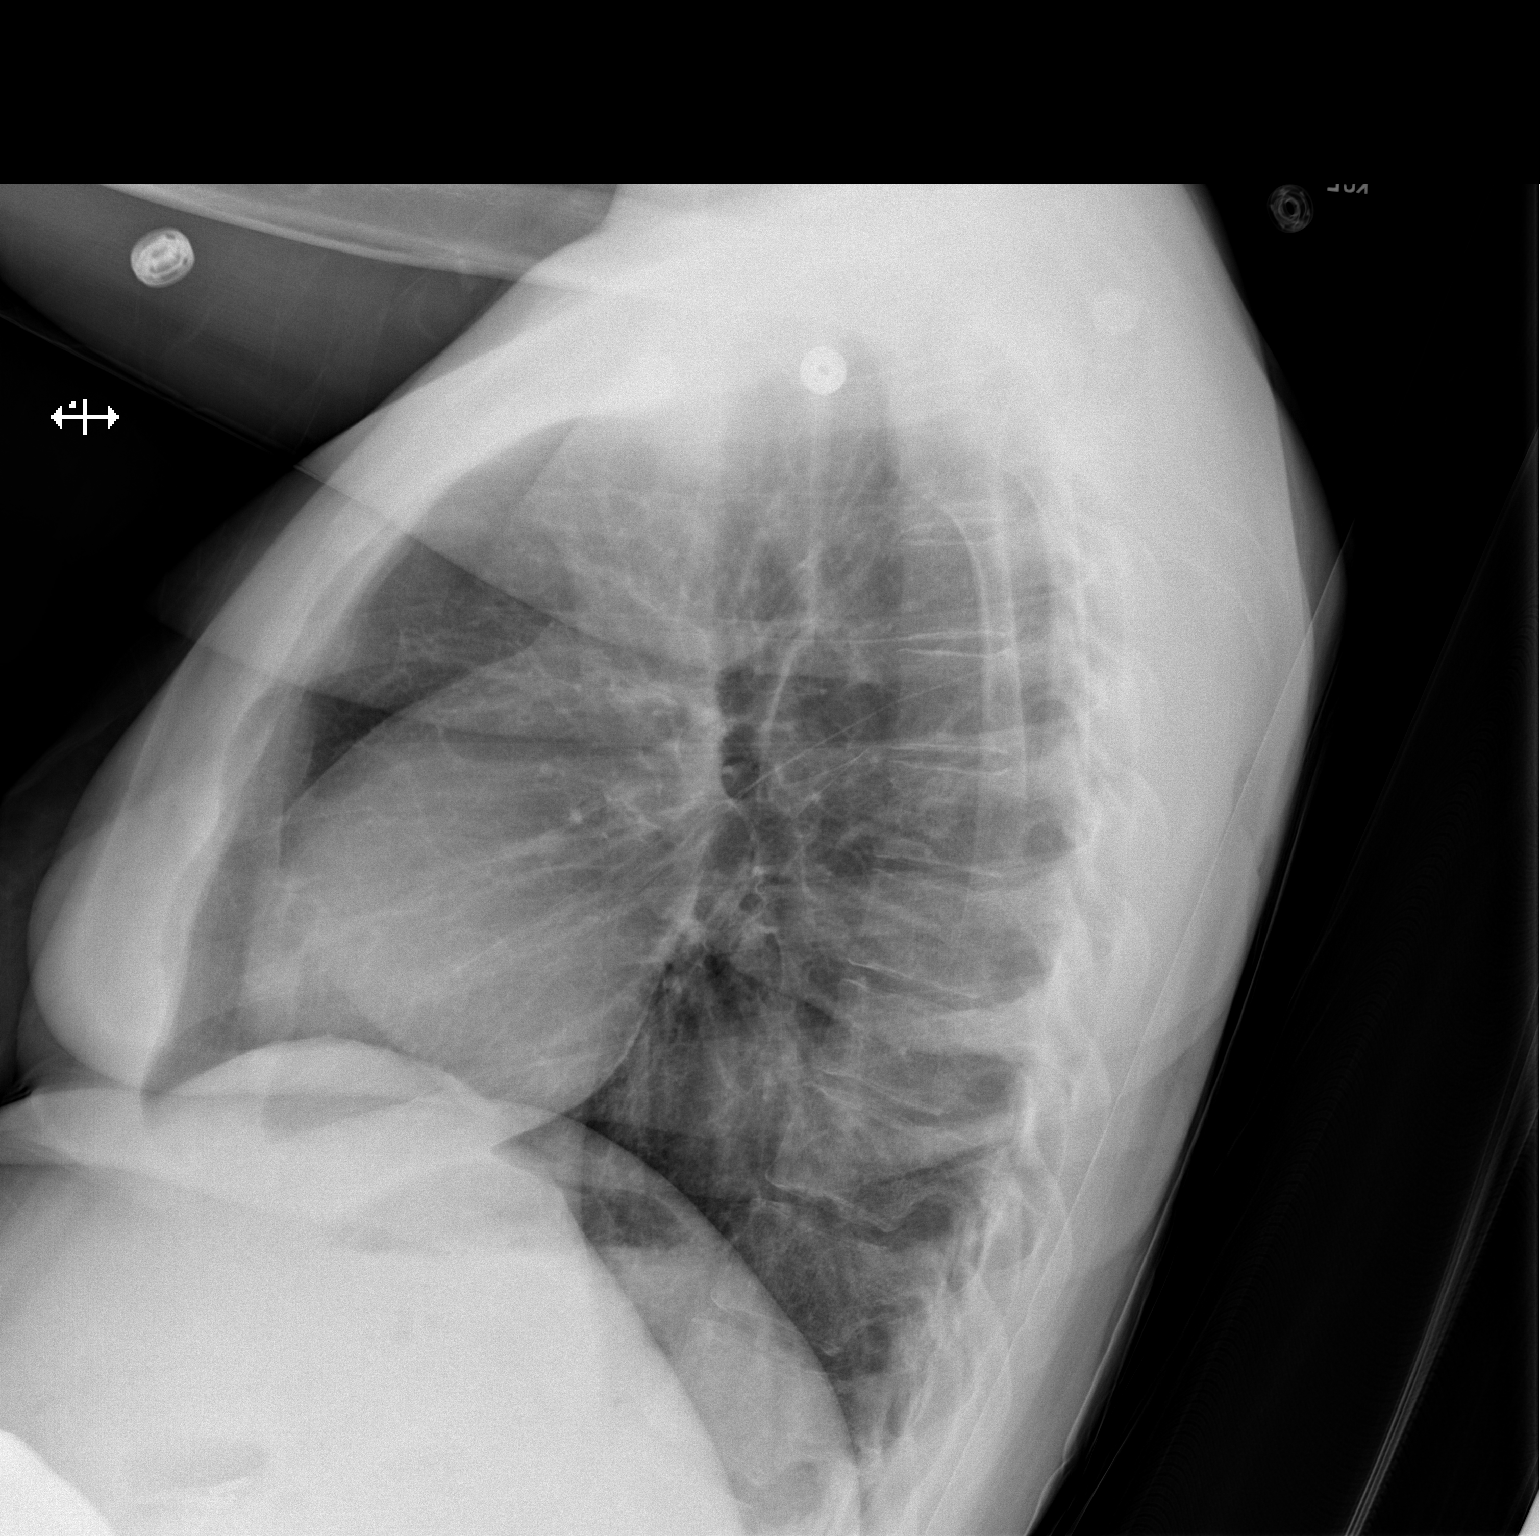

[x chest ap]
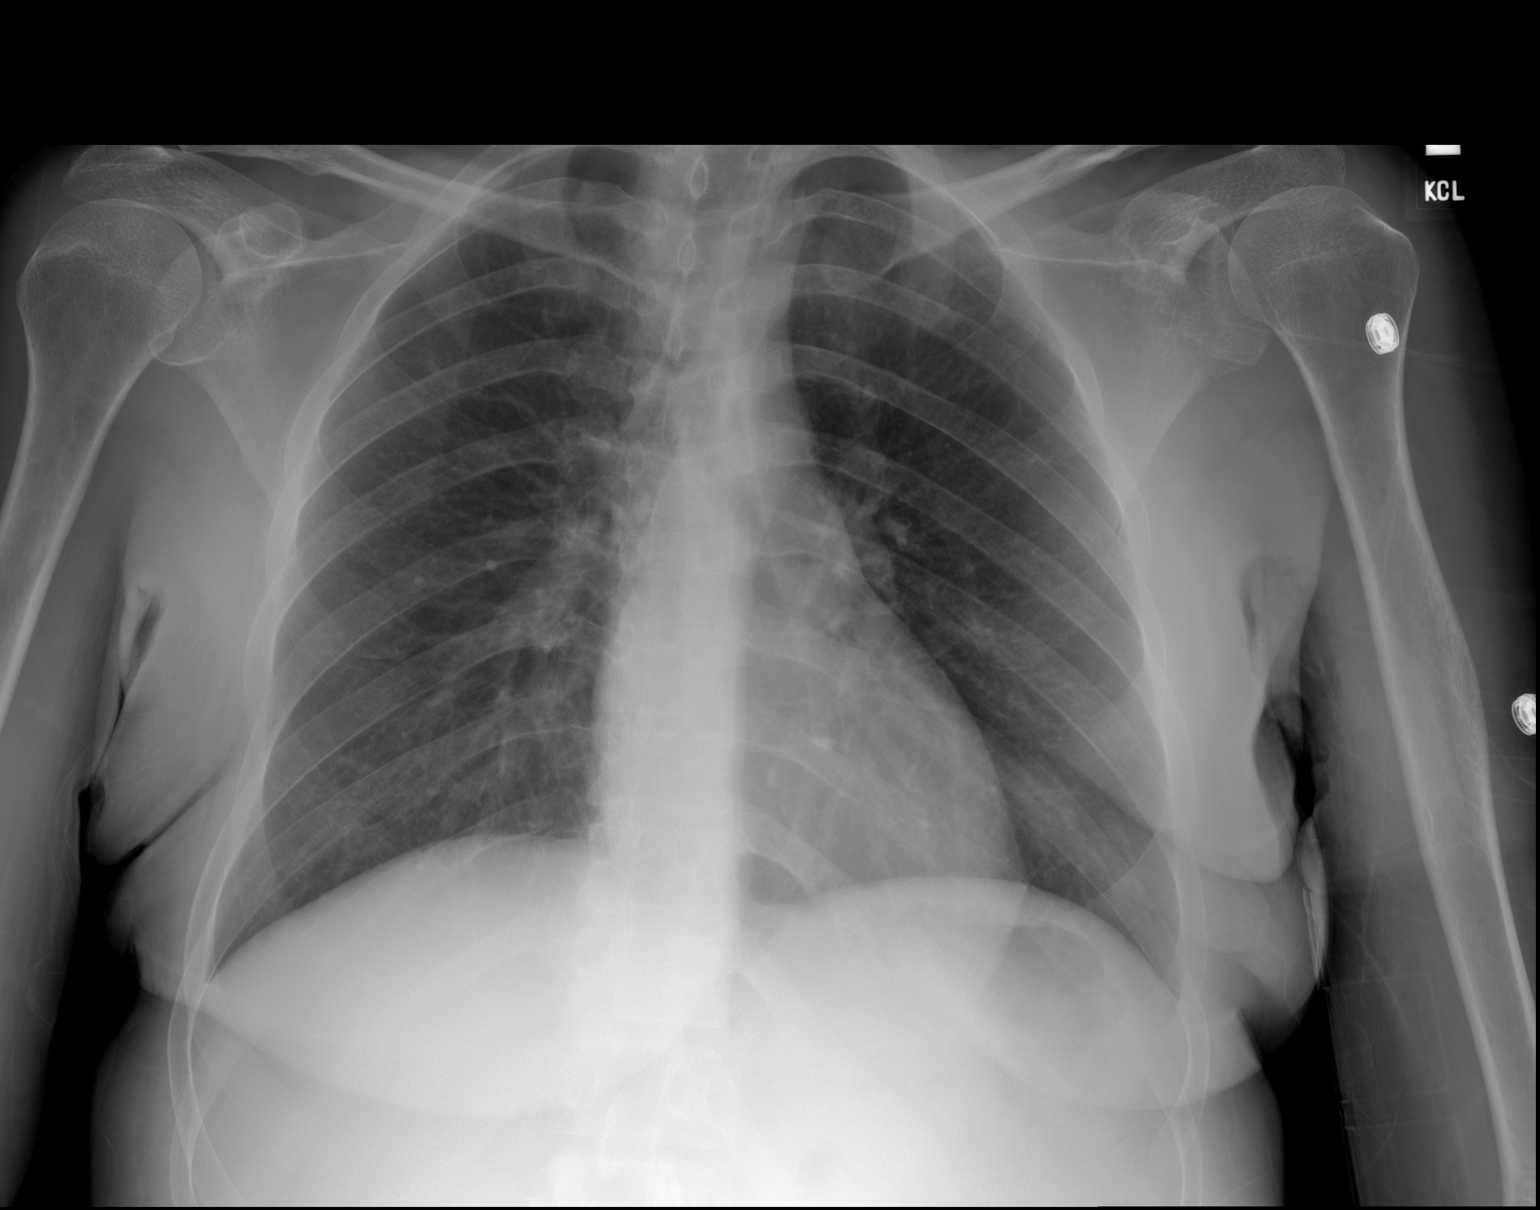

[2 of 2 positions shown; findings below may reference images not displayed]

FINDINGS: The cardiomediastinal contours are normal. The lungs are clear.
Pulmonary vasculature is normal. No consolidation, pleural effusion,
or pneumothorax. No acute osseous abnormalities are seen.
IMPRESSION: Normal radiographs of the chest.

## 2017-07-10 MED ORDER — FOLIC ACID 1 MG PO TABS
1.0000 mg | ORAL_TABLET | Freq: Every day | ORAL | Status: DC
  Administered 2017-07-10 – 2017-07-13 (×2): 1 mg via ORAL
  Filled 2017-07-10 (×3): qty 1

## 2017-07-10 MED ORDER — HYDROCODONE-ACETAMINOPHEN 5-325 MG PO TABS: 1.0000 | ORAL_TABLET | ORAL | Status: DC | PRN

## 2017-07-10 MED ORDER — PANCRELIPASE (LIP-PROT-AMYL) 12000-38000 UNITS PO CPEP
36000.0000 [IU] | ORAL_CAPSULE | Freq: Three times a day (TID) | ORAL | Status: DC
  Administered 2017-07-10 – 2017-07-13 (×7): 36000 [IU] via ORAL
  Filled 2017-07-10 (×3): qty 3
  Filled 2017-07-10: qty 1
  Filled 2017-07-10 (×6): qty 3

## 2017-07-10 MED ORDER — ONDANSETRON HCL 4 MG PO TABS: 4.0000 mg | ORAL_TABLET | Freq: Four times a day (QID) | ORAL | Status: DC | PRN

## 2017-07-10 MED ORDER — CYCLOBENZAPRINE HCL 10 MG PO TABS: 10.0000 mg | ORAL_TABLET | Freq: Two times a day (BID) | ORAL | Status: DC | PRN

## 2017-07-10 MED ORDER — HYDROMORPHONE HCL 1 MG/ML IJ SOLN
1.0000 mg | Freq: Once | INTRAMUSCULAR | Status: AC
  Administered 2017-07-10: 1 mg via INTRAVENOUS
  Filled 2017-07-10: qty 1

## 2017-07-10 MED ORDER — HYDROMORPHONE HCL 1 MG/ML IJ SOLN
0.5000 mg | INTRAMUSCULAR | Status: DC | PRN
Start: 2017-07-10 — End: 2017-07-11
  Administered 2017-07-10 – 2017-07-11 (×6): 1 mg via INTRAVENOUS
  Filled 2017-07-10 (×6): qty 1

## 2017-07-10 MED ORDER — INSULIN GLARGINE 100 UNIT/ML ~~LOC~~ SOLN
9.0000 [IU] | Freq: Every day | SUBCUTANEOUS | Status: DC
  Administered 2017-07-10 – 2017-07-12 (×2): 9 [IU] via SUBCUTANEOUS
  Filled 2017-07-10 (×5): qty 0.09

## 2017-07-10 MED ORDER — GABAPENTIN 300 MG PO CAPS
300.0000 mg | ORAL_CAPSULE | Freq: Two times a day (BID) | ORAL | Status: DC
  Administered 2017-07-10 – 2017-07-13 (×4): 300 mg via ORAL
  Filled 2017-07-10 (×6): qty 1

## 2017-07-10 MED ORDER — PIPERACILLIN-TAZOBACTAM 3.375 G IVPB 30 MIN
3.3750 g | Freq: Once | INTRAVENOUS | Status: AC
  Administered 2017-07-10: 3.375 g via INTRAVENOUS
  Filled 2017-07-10: qty 50

## 2017-07-10 MED ORDER — ACETAMINOPHEN 325 MG PO TABS
650.0000 mg | ORAL_TABLET | Freq: Four times a day (QID) | ORAL | Status: DC | PRN
  Administered 2017-07-10: 650 mg via ORAL
  Filled 2017-07-10: qty 2

## 2017-07-10 MED ORDER — SODIUM CHLORIDE 0.9 % IV BOLUS
1000.0000 mL | Freq: Once | INTRAVENOUS | Status: AC
  Administered 2017-07-10: 1000 mL via INTRAVENOUS

## 2017-07-10 MED ORDER — SODIUM CHLORIDE 0.9% FLUSH
3.0000 mL | Freq: Two times a day (BID) | INTRAVENOUS | Status: DC
  Administered 2017-07-10 – 2017-07-13 (×4): 3 mL via INTRAVENOUS

## 2017-07-10 MED ORDER — SODIUM CHLORIDE 0.9 % IV SOLN
INTRAVENOUS | Status: AC
  Administered 2017-07-10 – 2017-07-12 (×5): via INTRAVENOUS

## 2017-07-10 MED ORDER — ACETAMINOPHEN 650 MG RE SUPP: 650.0000 mg | Freq: Four times a day (QID) | RECTAL | Status: DC | PRN

## 2017-07-10 MED ORDER — FAMOTIDINE IN NACL 20-0.9 MG/50ML-% IV SOLN
20.0000 mg | Freq: Two times a day (BID) | INTRAVENOUS | Status: DC
  Administered 2017-07-10 – 2017-07-13 (×7): 20 mg via INTRAVENOUS
  Filled 2017-07-10 (×7): qty 50

## 2017-07-10 MED ORDER — CHOLESTYRAMINE LIGHT 4 G PO PACK
4.0000 g | PACK | Freq: Three times a day (TID) | ORAL | Status: DC
  Administered 2017-07-10 – 2017-07-13 (×4): 4 g via ORAL
  Filled 2017-07-10 (×11): qty 1

## 2017-07-10 MED ORDER — GLUCERNA SHAKE PO LIQD
237.0000 mL | Freq: Three times a day (TID) | ORAL | Status: DC
  Administered 2017-07-10 – 2017-07-12 (×3): 237 mL via ORAL
  Filled 2017-07-10 (×11): qty 237

## 2017-07-10 MED ORDER — METOCLOPRAMIDE HCL 5 MG/ML IJ SOLN
10.0000 mg | Freq: Three times a day (TID) | INTRAMUSCULAR | Status: DC
  Administered 2017-07-10 – 2017-07-11 (×4): 10 mg via INTRAVENOUS
  Filled 2017-07-10 (×4): qty 2

## 2017-07-10 MED ORDER — CITALOPRAM HYDROBROMIDE 20 MG PO TABS
20.0000 mg | ORAL_TABLET | Freq: Every day | ORAL | Status: DC
  Administered 2017-07-10 – 2017-07-13 (×2): 20 mg via ORAL
  Filled 2017-07-10 (×3): qty 1

## 2017-07-10 MED ORDER — PROMETHAZINE HCL 25 MG/ML IJ SOLN
12.5000 mg | Freq: Four times a day (QID) | INTRAMUSCULAR | Status: DC | PRN
  Administered 2017-07-10 – 2017-07-11 (×3): 25 mg via INTRAVENOUS
  Filled 2017-07-10 (×4): qty 1

## 2017-07-10 MED ORDER — ADULT MULTIVITAMIN W/MINERALS CH
1.0000 | ORAL_TABLET | Freq: Every day | ORAL | Status: DC
  Administered 2017-07-13: 1 via ORAL
  Filled 2017-07-10 (×3): qty 1

## 2017-07-10 MED ORDER — INSULIN ASPART 100 UNIT/ML ~~LOC~~ SOLN
0.0000 [IU] | SUBCUTANEOUS | Status: DC
  Administered 2017-07-10: 1 [IU] via SUBCUTANEOUS
  Administered 2017-07-10: 2 [IU] via SUBCUTANEOUS
  Administered 2017-07-10: 1 [IU] via SUBCUTANEOUS
  Administered 2017-07-10: 2 [IU] via SUBCUTANEOUS
  Administered 2017-07-12: 5 [IU] via SUBCUTANEOUS
  Administered 2017-07-12: 7 [IU] via SUBCUTANEOUS
  Administered 2017-07-13: 1 [IU] via SUBCUTANEOUS
  Administered 2017-07-13 (×2): 2 [IU] via SUBCUTANEOUS
  Filled 2017-07-10: qty 1

## 2017-07-10 MED ORDER — SUCRALFATE 1 GM/10ML PO SUSP
1.0000 g | Freq: Three times a day (TID) | ORAL | Status: DC
  Administered 2017-07-10 – 2017-07-13 (×9): 1 g via ORAL
  Filled 2017-07-10 (×12): qty 10

## 2017-07-10 MED ORDER — ONDANSETRON HCL 4 MG/2ML IJ SOLN
4.0000 mg | Freq: Four times a day (QID) | INTRAMUSCULAR | Status: DC | PRN
  Administered 2017-07-10 – 2017-07-12 (×7): 4 mg via INTRAVENOUS
  Filled 2017-07-10 (×7): qty 2

## 2017-07-10 MED ORDER — SODIUM CHLORIDE 0.9 % IV SOLN
100.0000 mg | Freq: Two times a day (BID) | INTRAVENOUS | Status: DC
  Administered 2017-07-10 – 2017-07-13 (×7): 100 mg via INTRAVENOUS
  Filled 2017-07-10 (×8): qty 100

## 2017-07-10 NOTE — Progress Notes (Signed)
Pt's BP 160/110 while pt resting in bed. On call [place] made aware. Will continue to monitor pt.

## 2017-07-10 NOTE — Progress Notes (Signed)
Patient seen and evaluated, chart reviewed, please see EMR for updated orders. Please see full H&P dictated by admitting physician Dr Myna Hidalgo for same date of service. RN at bedside   1) intractable emesis-may be related to gastroparesis, advance diet slowly, hydrate IV until oral intake is reliable  2)DM1- Allow some permissive Hyperglycemia rather than risk life-threatening hypoglycemia in a patient with unreliable oral intake. Use Novolog/Humalog Sliding scale insulin with Accu-Cheks/Fingersticks as ordered  3) pelvic abscess-to admission recently started on Keflex and Bactrim, currently on doxycycline, patient has an appointment with [facility] neurosurgery on 07/13/2017   Patient seen and evaluated, chart reviewed, please see EMR for updated orders. Please see full H&P dictated by admitting physician Dr Myna Hidalgo for same date of service.

## 2017-07-10 NOTE — ED Provider Notes (Signed)
[facility]-EMERGENCY DEPT Provider Note   CSN: [id number] Arrival date & time: 07/09/17  2158     History   Chief Complaint Chief Complaint  Patient presents with  . Hypotension  . Emesis  . Abdominal Pain    HPI [name] is a 27 y.o. female.  Patient presents with nausea and vomiting ongoing for the past 1 day.  She was seen yesterday for similar symptoms related to her gluteal abscess and abdominal pain.  States she did not have any vomiting until she left.  Is not been able to keep anything down all day.  No fever.  Has pain in her gluteal area where she has an ongoing abscess since January with drains in place.  This was evaluated on CT scan yesterday and determined to be stable.  She denies any pain with urination or blood in the urine.  No vaginal bleeding or discharge.  No chest pain or shortness of breath.  She denies any recent cough or fevers.  The history is provided by the patient.  Emesis   Associated symptoms include abdominal pain. Pertinent negatives include no cough, no diarrhea, no fever, no headaches and no myalgias.  Abdominal Pain   Associated symptoms include nausea and vomiting. Pertinent negatives include fever, diarrhea, dysuria, hematuria, headaches and myalgias.    Past Medical History:  Diagnosis Date  . Anxiety   . Diabetes type 1, uncontrolled (HCC) 11/14/2011   Since age 6  . Fibromyalgia   . Gastroparesis   . Hypertension   . Infection    UTI April 2016    Patient Active Problem List   Diagnosis Date Noted  . SIRS (systemic inflammatory response syndrome) (HCC) 06/23/2017  . Hypokalemia 06/23/2017  . Normocytic normochromic anemia 06/23/2017  . GERD (gastroesophageal reflux disease) 06/23/2017  . Intractable nausea and vomiting 04/11/2017  . Nausea and vomiting 04/10/2017  . Essential hypertension   . Colitis   . Crohn's disease (HCC) 04/03/2017  . Abscess and cellulitis of gluteal region 03/24/2017  .  Cellulitis and abscess of buttock 03/23/2017  . Malnutrition of moderate degree 03/19/2017  . Anasarca 03/16/2017  . 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 Diarrhea 02/27/2017  . Lower GI bleed 02/27/2017  . DKA (diabetic ketoacidosis) (HCC) 02/26/2017  . Acute metabolic encephalopathy   . DKA, type 1 (HCC) 06/11/2013  . Orthostatic hypotension dysautonomic syndrome (HCC) 04/28/2013  . Body aches 04/28/2013  . Orthostatic hypotension 04/28/2013  . Diabetes mellitus type 1 (HCC) 04/28/2013  . Protein-calorie malnutrition, severe (HCC) 04/28/2013  . Noncompliance with diabetes treatment 09/01/2012  . Dyspnea 02/28/2012  . Sinus tachycardia 02/28/2012  . Hyperglycemia without ketosis 02/28/2012  . Chest pain 12/04/2011  . Diabetes type 1, uncontrolled (HCC) 11/14/2011  . Hypertension associated with diabetes (HCC) 11/14/2011  . Gastroparesis 11/14/2011  . Hyponatremia 09/19/2011  . Abdominal pain 09/18/2011  . Metabolic alkalosis 09/18/2011  . Diabetic hyperosmolar non-ketotic state (HCC) 09/18/2011  . DIAB W/UNSPEC COMP TYPE I [JUV TYPE] UNCNTRL 06/04/2010  . CONSTIPATION 01/01/2010  . RECTAL PAIN 01/01/2010    Past Surgical History:  Procedure Laterality Date  . ANKLE SURGERY    . CHOLECYSTECTOMY  11/15/2011   Procedure: LAPAROSCOPIC CHOLECYSTECTOMY WITH INTRAOPERATIVE CHOLANGIOGRAM;  Surgeon: [name], MD;  Location: [facility];  Service: General;  Laterality: N/A;  . COLONOSCOPY WITH PROPOFOL N/A 06/27/2017   Procedure: COLONOSCOPY WITH PROPOFOL;  Surgeon: Rachael Fee, MD;  Location: [facility];  Service: Endoscopy;  Laterality: N/A;  . ESOPHAGOGASTRODUODENOSCOPY  12/03/2011   Procedure: ESOPHAGOGASTRODUODENOSCOPY (EGD);  Surgeon: Theda Belfast, MD;  Location: [facility];  Service: Endoscopy;  Laterality: N/A;  . FLEXIBLE SIGMOIDOSCOPY N/A 03/10/2017   Procedure: FLEXIBLE SIGMOIDOSCOPY;  Surgeon: Jeani Hawking, MD;  Location: [facility]  ENDOSCOPY;  Service: Endoscopy;  Laterality: N/A;  . INCISION AND DRAINAGE PERIRECTAL ABSCESS N/A 03/01/2017   Procedure: IRRIGATION AND DEBRIDEMENT PERIRECTAL ABSCESS;  Surgeon: Ovidio Kin, MD;  Location: [facility];  Service: General;  Laterality: N/A;  . IRRIGATION AND DEBRIDEMENT BUTTOCKS N/A 03/23/2017   Procedure: IRRIGATION AND DEBRIDEMENT BUTTOCKS, SETON PLACEMENT;  Surgeon: Romie Levee, MD;  Location: [facility];  Service: General;  Laterality: N/A;  . LAPAROSCOPY  11/23/2011   Procedure: LAPAROSCOPY DIAGNOSTIC;  Surgeon: Mariella Saa, MD;  Location: [facility];  Service: General;  Laterality: N/A;     OB History    Gravida  1   Para  1   Term      Preterm  1   AB      Living  1     SAB      TAB      Ectopic      Multiple  0   Live Births  1            Home Medications    Prior to Admission medications   Medication Sig Start Date End Date Taking? Authorizing Provider  acetaminophen (TYLENOL) 500 MG tablet Take 1,000 mg by mouth daily as needed for moderate pain.     [provider]  cephALEXin (KEFLEX) 500 MG capsule Take 1 capsule (500 mg total) by mouth 3 (three) times daily for 10 days. 07/09/17 07/19/17  Shaune Pollack, MD  cholestyramine light (PREVALITE) 4 g packet Take 1 packet (4 g total) by mouth 3 (three) times daily. Patient taking differently: Take 4 g by mouth 2 (two) times daily as needed (diarrhea).  03/14/17   Rolly Salter, MD  citalopram (CELEXA) 20 MG tablet Take 1 tablet (20 mg total) by mouth daily. 05/13/17   Henson, Vickie L, NP-C  cyclobenzaprine (FLEXERIL) 10 MG tablet Take 1 tablet (10 mg total) by mouth 2 (two) times daily as needed for muscle spasms. 05/21/17   Khatri, Hina, PA-C  EPINEPHrine (EPIPEN 2-PAK) 0.3 mg/0.3 mL IJ SOAJ injection Inject 0.3 mLs (0.3 mg total) into the muscle as needed (allergic reaction). Patient taking differently: Inject 0.3 mg into the muscle once as needed (severe allergic reaction).  03/21/17    Glade Lloyd, MD  famotidine (PEPCID) 40 MG tablet Take 1 tablet (40 mg total) by mouth 2 (two) times daily. 06/21/17   Bethel Born, PA-C  ferrous sulfate 325 (65 FE) MG tablet Take 1 tablet (325 mg total) by mouth 2 (two) times daily with a meal. 05/13/17   Henson, Vickie L, NP-C  folic acid (FOLVITE) 1 MG tablet Take 1 tablet (1 mg total) by mouth daily. 05/13/17   Henson, Vickie L, NP-C  furosemide (LASIX) 40 MG tablet Take 40 mg by mouth daily as needed for fluid.     [provider]  gabapentin (NEURONTIN) 300 MG capsule Take 1 capsule (300 mg total) by mouth 2 (two) times daily. 04/03/17   Henson, Vickie L, NP-C  glucose 4 GM chewable tablet Chew 1 tablet (4 g total) by mouth as needed for low blood sugar. Patient taking differently: Chew 1 tablet by mouth once as needed for low blood sugar.  08/10/14   Orvilla Cornwall A, CNM  HYDROcodone-acetaminophen (NORCO/VICODIN) 5-325 MG tablet Take 1-2 tablets by mouth every 6 (six) hours as needed for moderate pain or severe pain. 07/09/17   Shaune Pollack, MD  insulin lispro (HUMALOG) 100 UNIT/ML injection Inject 0.02 mLs (2 Units total) into the skin 3 (three) times daily with meals. Patient taking differently: Inject 0-9 Units into the skin 3 (three) times daily with meals. Sliding scale 04/09/17   Romero Belling, MD  LANTUS 100 UNIT/ML injection Inject 0.18 mLs (18 Units total) into the skin every morning. Patient taking differently: Inject 18 Units into the skin daily before breakfast.  05/07/17   Romero Belling, MD  lidocaine (LIDODERM) 5 % Place 1 patch onto the skin daily. Remove & Discard patch within 12 hours or as directed by MD Patient taking differently: Place 1 patch onto the skin daily as needed (pain). Remove & Discard patch within 12 hours or as directed by MD 05/21/17   Dietrich Pates, PA-C  lidocaine (XYLOCAINE) 2 % solution Use as directed 15 mLs in the mouth or throat every 6 (six) hours as needed (stomach pain). 05/31/17    Nira Conn, MD  lipase/protease/amylase (CREON) 36000 UNITS CPEP capsule Take 1 capsule (36,000 Units total) by mouth 3 (three) times daily with meals. 03/14/17   Rolly Salter, MD  loperamide (IMODIUM) 2 MG capsule Take 1 capsule (2 mg total) by mouth 3 (three) times daily before meals. DO NOT TAKE IT FOR THE DAY YOU DONT HAVE A BOWEL MOVEMENT. Patient taking differently: Take 2 mg by mouth 3 (three) times daily as needed for diarrhea or loose stools.  04/03/17   Henson, Vickie L, NP-C  metoCLOPramide (REGLAN) 5 MG tablet Take 5 mg by mouth every 6 (six) hours as needed for nausea or vomiting.     [provider]  ondansetron (ZOFRAN ODT) 4 MG disintegrating tablet Take 1 tablet (4 mg total) by mouth every 8 (eight) hours as needed for nausea or vomiting. 07/09/17   Shaune Pollack, MD  sucralfate (CARAFATE) 1 GM/10ML suspension Take 10 mLs (1 g total) by mouth 4 (four) times daily -  with meals and at bedtime. 06/17/17   Elpidio Anis, PA-C  sulfamethoxazole-trimethoprim (BACTRIM DS,SEPTRA DS) 800-160 MG tablet Take 1 tablet by mouth 2 (two) times daily for 10 days. 07/09/17 07/19/17  Shaune Pollack, MD    Family History Family History  Problem Relation Age of Onset  . Diabetes Mother   . Hypertension Father     Social History Social History   Tobacco Use  . Smoking status: Never Smoker  . Smokeless tobacco: Never Used  Substance Use Topics  . Alcohol use: No    Alcohol/week: 0.0 oz    Frequency: Never  . Drug use: No     Allergies   Other and Lactose intolerance (gi)   Review of Systems Review of Systems  Constitutional: Positive for activity change and appetite change. Negative for fever.  HENT: Negative for congestion and rhinorrhea.   Respiratory: Negative for cough, chest tightness and shortness of breath.   Gastrointestinal: Positive for abdominal pain, nausea and vomiting. Negative for diarrhea.  Genitourinary: Negative for dysuria, hematuria,  vaginal bleeding and vaginal discharge.  Musculoskeletal: Negative for myalgias.  Skin: Negative for rash.  Neurological: Positive for weakness and light-headedness. Negative for dizziness and headaches.   all other systems are negative except as noted in the HPI and PMH.     Physical Exam Updated Vital  Signs BP (!) 154/118   Pulse (!) 118   Temp (!) 97.5 F (36.4 C) (Oral)   Resp (!) 22   Ht 5\' 7"  (1.702 m)   Wt 59 kg (130 lb)   SpO2 100%   BMI 20.36 kg/m   Physical Exam  Constitutional: She is oriented to person, place, and time. She appears well-developed and well-nourished. No distress.  Dry mucous membranes, ill-appearing  HENT:  Head: Normocephalic and atraumatic.  Mouth/Throat: Oropharynx is clear and moist. No oropharyngeal exudate.  Eyes: Pupils are equal, round, and reactive to light. Conjunctivae and EOM are normal.  Neck: Normal range of motion. Neck supple.  No meningismus.  Cardiovascular: Normal rate, regular rhythm, normal heart sounds and intact distal pulses.  No murmur heard. Pulmonary/Chest: Effort normal and breath sounds normal. No respiratory distress. She exhibits no tenderness.  Abdominal: Soft. There is tenderness. There is no rebound and no guarding.  Soft mild diffuse tenderness  Genitourinary:  Genitourinary Comments: Penrose drain in place to right gluteal area.  There is no significant drainage or erythema no fluctuance  Musculoskeletal: Normal range of motion. She exhibits no edema or tenderness.  Neurological: She is alert and oriented to person, place, and time. No cranial nerve deficit. She exhibits normal muscle tone. Coordination normal.  No ataxia on finger to nose bilaterally. No pronator drift. 5/5 strength throughout. CN 2-12 intact.Equal grip strength. Sensation intact.   Skin: Skin is warm.  Psychiatric: She has a normal mood and affect. Her behavior is normal.  Nursing note and vitals reviewed.    ED Treatments / Results   Labs (all labs ordered are listed, but only abnormal results are displayed) Labs Reviewed  COMPREHENSIVE METABOLIC PANEL - Abnormal; Notable for the following components:      Result Value   Glucose, Bld 204 (*)    All other components within normal limits  CBC - Abnormal; Notable for the following components:   RBC 3.71 (*)    Hemoglobin 10.6 (*)    HCT 31.6 (*)    All other components within normal limits  URINALYSIS, ROUTINE W REFLEX MICROSCOPIC - Abnormal; Notable for the following components:   Hgb urine dipstick MODERATE (*)    Ketones, ur 20 (*)    Protein, ur 30 (*)    Squamous Epithelial / LPF 0-5 (*)    All other components within normal limits  GLUCOSE, CAPILLARY - Abnormal; Notable for the following components:   Glucose-Capillary 126 (*)    All other components within normal limits  CBG MONITORING, ED - Abnormal; Notable for the following components:   Glucose-Capillary 196 (*)    All other components within normal limits  CBG MONITORING, ED - Abnormal; Notable for the following components:   Glucose-Capillary 181 (*)    All other components within normal limits  CULTURE, BLOOD (ROUTINE X 2)  CULTURE, BLOOD (ROUTINE X 2)  URINE CULTURE  LIPASE, BLOOD  PROTIME-INR  BASIC METABOLIC PANEL  I-STAT BETA HCG BLOOD, ED ([facility], [facility], [facility] ONLY)  I-STAT CG4 LACTIC ACID, ED   EKG None  Radiology Ct Pelvis W Contrast  Result Date: 07/09/2017 CLINICAL DATA:  Gluteal abscess.  Drain in place. EXAM: CT PELVIS WITH CONTRAST TECHNIQUE: Multidetector CT imaging of the pelvis was performed using the standard protocol following the bolus administration of intravenous contrast. CONTRAST:  80mL ISOVUE-300 IOPAMIDOL (ISOVUE-300) INJECTION 61% COMPARISON:  Abdominopelvic CT 06/19/2017 FINDINGS: Urinary Tract: Distal ureters are decompressed. Urinary bladder is mildly distended. No bladder  wall thickening. Bowel: Colonic wall thickening on prior exam has diminished in the interim, with  persistent sigmoid and rectal wall thickening. No evidence of acute bowel inflammation. Vascular/Lymphatic: Few prominent inguinal nodes are likely reactive. No acute vascular findings. Reproductive:  Uterus and adnexa are unremarkable. Other: Right gluteal drainage catheter is unchanged in position. Unchanged appearance of the adjacent soft tissue density without dominant fluid collection. Soft tissue fullness tracks in the gluteal crease to the anal junction without significant no evidence of new fluid collection. No significant change from prior exam or progressive inflammatory change. No intrapelvic fluid collection or abscess. Musculoskeletal: There are no acute or suspicious osseous abnormalities. IMPRESSION: 1. Right gluteal drainage catheter in place, unchanged in appearance with associated soft tissue density in the subcutaneous tissues tracking to the anorectal region, suspicious for anocutaneous fistula. Overall no significant change from prior. No increased inflammation or new fluid collection. 2. Persistent but improved wall thickening of the rectosigmoid colon. Electronically Signed   By: Rubye Oaks M.D.   On: 07/09/2017 05:15    Procedures Procedures (including critical care time)  Medications Ordered in ED Medications  sodium chloride 0.9 % bolus 1,000 mL (has no administration in time range)  sodium chloride 0.9 % bolus 1,000 mL (has no administration in time range)  ondansetron (ZOFRAN) injection 4 mg (has no administration in time range)     Initial Impression / Assessment and Plan / ED Course  I have reviewed the triage vital signs and the nursing notes.  Pertinent labs & imaging results that were available during my care of the patient were reviewed by me and considered in my medical decision making (see chart for details).    Patient presents with intractable nausea and vomiting.  She is a type I diabetic with known gluteal abscess that is being treated with  drainage.  Patient presents with inability to keep medications down.  She is tachycardic and hypotensive but not febrile.  Infectious workup begun.  Given IV fluids and IV antibiotics. She is hyperglycemic but not in DKA.  Normal anion gap.  She is aggressively hydrated given symptom control.  She remains tachycardic and I will to tolerate p.o. in the ED. Empiric antibiotics given given her ongoing known gluteal abscess and cellulitis.  She does not appear to be septic with a normal lactate at this time  Plan admission for intractable nausea and vomiting and pain control.  Discussed with Dr. Antionette Char  CRITICAL CARE Performed by: Glynn Octave Total critical care time: 32 minutes Critical care time was exclusive of separately billable procedures and treating other patients. Critical care was necessary to treat or prevent imminent or life-threatening deterioration. Critical care was time spent personally by me on the following activities: development of treatment plan with patient and/or surrogate as well as nursing, discussions with consultants, evaluation of patient's response to treatment, examination of patient, obtaining history from patient or surrogate, ordering and performing treatments and interventions, ordering and review of laboratory studies, ordering and review of radiographic studies, pulse oximetry and re-evaluation of patient's condition.  Final Clinical Impressions(s) / ED Diagnoses   Final diagnoses:  Intractable cyclical vomiting with nausea    ED Discharge Orders    None       Thia Olesen, Jeannett Senior, MD 07/10/17 [phone]

## 2017-07-10 NOTE — H&P (Signed)
History and Physical    [name] RSW:[id number] DOB: September 08, 1990 DOA: 07/09/2017  PCP: Patient, No Pcp Per   Patient coming from: Home  Chief Complaint: Abdominal pain, N/V   HPI: [name] is a 27 y.o. female with medical history significant for insulin-dependent diabetes, chronic normocytic anemia, chronic abdominal pain, and gluteal abscess followed by surgery at [facility], now presenting to the emergency department for evaluation of abdominal pain, nausea, and nonbloody vomiting.  Patient reports that her abdominal pain developed insidiously over the past 3 or 4 days and she began vomiting approximately 24 hours ago.  She was seen in the emergency department yesterday for the abdominal pain but was not vomiting at that time.  She had a reassuring workup, but there was concern for cellulitis surrounding her gluteal abscess that has a drain in place, and she was sent home with oral antibiotics.  She began vomiting shortly after returning home and has been unable to keep her medications down.  She describes her abdominal pain as severe, generalized, similar to her prior abdominal pain.  No fevers or chills, no chest pain, no cough or dyspnea.  ED Course: Upon arrival to the ED, patient is found to be afebrile, saturating well on room air, tachycardic to 120, and initial blood pressure low.  EKG features a sinus tachycardia with rate 108 and chest x-ray is a normal study.  Chemistry panel is notable for glucose 204 and CBC features a chronic stable normocytic anemia with hemoglobin of 10.6.  Lactic acid is reassuringly normal and urinalysis is unremarkable.  Blood and urine cultures were collected, 3 L of normal saline administered, and the patient was treated with empiric vancomycin and Zosyn given the low blood pressure on arrival.  She became hypertensive after the fluid, has remained mildly tachycardic but stable, and will be observed on the telemetry unit for ongoing evaluation and  management of intractable nausea with nonbloody vomiting suspected secondary to gastroparesis.  Review of Systems:  All other systems reviewed and apart from HPI, are negative.  Past Medical History:  Diagnosis Date  . Anxiety   . Diabetes type 1, uncontrolled ([place]) 11/14/2011   Since age 25  . Fibromyalgia   . Gastroparesis   . Hypertension   . Infection    UTI April 2016    Past Surgical History:  Procedure Laterality Date  . ANKLE SURGERY    . CHOLECYSTECTOMY  11/15/2011   Procedure: LAPAROSCOPIC CHOLECYSTECTOMY WITH INTRAOPERATIVE CHOLANGIOGRAM;  Surgeon: [name], MD;  Location: [facility];  Service: General;  Laterality: N/A;  . COLONOSCOPY WITH PROPOFOL N/A 06/27/2017   Procedure: COLONOSCOPY WITH PROPOFOL;  Surgeon: Milus Banister, MD;  Location: [facility];  Service: Endoscopy;  Laterality: N/A;  . ESOPHAGOGASTRODUODENOSCOPY  12/03/2011   Procedure: ESOPHAGOGASTRODUODENOSCOPY (EGD);  Surgeon: Beryle Beams, MD;  Location: [facility];  Service: Endoscopy;  Laterality: N/A;  . FLEXIBLE SIGMOIDOSCOPY N/A 03/10/2017   Procedure: FLEXIBLE SIGMOIDOSCOPY;  Surgeon: Carol Ada, MD;  Location: [facility];  Service: Endoscopy;  Laterality: N/A;  . INCISION AND DRAINAGE PERIRECTAL ABSCESS N/A 03/01/2017   Procedure: IRRIGATION AND DEBRIDEMENT PERIRECTAL ABSCESS;  Surgeon: Alphonsa Overall, MD;  Location: [facility];  Service: General;  Laterality: N/A;  . IRRIGATION AND DEBRIDEMENT BUTTOCKS N/A 03/23/2017   Procedure: IRRIGATION AND DEBRIDEMENT BUTTOCKS, SETON PLACEMENT;  Surgeon: [name], MD;  Location: [facility];  Service: General;  Laterality: N/A;  . LAPAROSCOPY  11/23/2011   Procedure: LAPAROSCOPY DIAGNOSTIC;  Surgeon:  Edward Jolly, MD;  Location: [facility];  Service: General;  Laterality: N/A;     reports that she has never smoked. She has never used smokeless tobacco. She reports that she does not drink alcohol or use drugs.  Allergies  Allergen Reactions  .  Other Anaphylaxis    Reaction to [place] nuts   . Lactose Intolerance (Gi) Diarrhea    Family History  Problem Relation Age of Onset  . Diabetes Mother   . Hypertension Father      Prior to Admission medications   Medication Sig Start Date End Date Taking? Authorizing Provider  acetaminophen (TYLENOL) 500 MG tablet Take 1,000 mg by mouth daily as needed for moderate pain.    Yes [provider]  cephALEXin (KEFLEX) 500 MG capsule Take 1 capsule (500 mg total) by mouth 3 (three) times daily for 10 days. 07/09/17 07/19/17 Yes Duffy Bruce, MD  cholestyramine light (PREVALITE) 4 g packet Take 1 packet (4 g total) by mouth 3 (three) times daily. Patient taking differently: Take 4 g by mouth 2 (two) times daily as needed (diarrhea).  03/14/17  Yes Lavina Hamman, MD  cyclobenzaprine (FLEXERIL) 10 MG tablet Take 1 tablet (10 mg total) by mouth 2 (two) times daily as needed for muscle spasms. 05/21/17  Yes Khatri, Hina, PA-C  EPINEPHrine (EPIPEN 2-PAK) 0.3 mg/0.3 mL IJ SOAJ injection Inject 0.3 mLs (0.3 mg total) into the muscle as needed (allergic reaction). Patient taking differently: Inject 0.3 mg into the muscle once as needed (severe allergic reaction).  03/21/17  Yes Aline August, MD  famotidine (PEPCID) 40 MG tablet Take 1 tablet (40 mg total) by mouth 2 (two) times daily. 06/21/17  Yes Recardo Evangelist, PA-C  ferrous sulfate 325 (65 FE) MG tablet Take 1 tablet (325 mg total) by mouth 2 (two) times daily with a meal. 05/13/17  Yes Henson, Vickie L, NP-C  folic acid (FOLVITE) 1 MG tablet Take 1 tablet (1 mg total) by mouth daily. 05/13/17  Yes Henson, Vickie L, NP-C  furosemide (LASIX) 40 MG tablet Take 40 mg by mouth daily as needed for fluid.    Yes [provider]  gabapentin (NEURONTIN) 300 MG capsule Take 1 capsule (300 mg total) by mouth 2 (two) times daily. 04/03/17  Yes Henson, Vickie L, NP-C  glucose 4 GM chewable tablet Chew 1 tablet (4 g total) by mouth as needed  for low blood sugar. Patient taking differently: Chew 1 tablet by mouth once as needed for low blood sugar.  08/10/14  Yes Denney, Rachelle A, CNM  HYDROcodone-acetaminophen (NORCO/VICODIN) 5-325 MG tablet Take 1-2 tablets by mouth every 6 (six) hours as needed for moderate pain or severe pain. 07/09/17  Yes Duffy Bruce, MD  insulin lispro (HUMALOG) 100 UNIT/ML injection Inject 0.02 mLs (2 Units total) into the skin 3 (three) times daily with meals. Patient taking differently: Inject 0-9 Units into the skin 3 (three) times daily with meals. Sliding scale 04/09/17  Yes Renato Shin, MD  LANTUS 100 UNIT/ML injection Inject 0.18 mLs (18 Units total) into the skin every morning. Patient taking differently: Inject 18 Units into the skin daily before breakfast.  05/07/17  Yes Renato Shin, MD  lidocaine (LIDODERM) 5 % Place 1 patch onto the skin daily. Remove & Discard patch within 12 hours or as directed by MD Patient taking differently: Place 1 patch onto the skin daily as needed (pain). Remove & Discard patch within 12 hours or as directed by MD  05/21/17  Yes Khatri, Hina, PA-C  lidocaine (XYLOCAINE) 2 % solution Use as directed 15 mLs in the mouth or throat every 6 (six) hours as needed (stomach pain). 05/31/17  Yes Cardama, Grayce Sessions, MD  lipase/protease/amylase (CREON) 36000 UNITS CPEP capsule Take 1 capsule (36,000 Units total) by mouth 3 (three) times daily with meals. 03/14/17  Yes Lavina Hamman, MD  loperamide (IMODIUM) 2 MG capsule Take 1 capsule (2 mg total) by mouth 3 (three) times daily before meals. DO NOT TAKE IT FOR THE DAY YOU DONT HAVE A BOWEL MOVEMENT. Patient taking differently: Take 2 mg by mouth 3 (three) times daily as needed for diarrhea or loose stools.  04/03/17  Yes Henson, Vickie L, NP-C  metoCLOPramide (REGLAN) 5 MG tablet Take 5 mg by mouth every 6 (six) hours as needed for nausea or vomiting.    Yes [provider]  ondansetron (ZOFRAN ODT) 4 MG disintegrating  tablet Take 1 tablet (4 mg total) by mouth every 8 (eight) hours as needed for nausea or vomiting. 07/09/17  Yes Duffy Bruce, MD  sucralfate (CARAFATE) 1 GM/10ML suspension Take 10 mLs (1 g total) by mouth 4 (four) times daily -  with meals and at bedtime. 06/17/17  Yes Upstill, Nehemiah Settle, PA-C  sulfamethoxazole-trimethoprim (BACTRIM DS,SEPTRA DS) 800-160 MG tablet Take 1 tablet by mouth 2 (two) times daily for 10 days. 07/09/17 07/19/17 Yes Duffy Bruce, MD  citalopram (CELEXA) 20 MG tablet Take 1 tablet (20 mg total) by mouth daily. 05/13/17   Girtha Rm, NP-C    Physical Exam: Vitals:   07/10/17 0100 07/10/17 0130 07/10/17 0300 07/10/17 0330  BP:   (!) 171/101 (!) 168/98  Pulse: (!) 121 (!) 117 (!) 114 (!) 113  Resp: 13 16 13 17   Temp:      TempSrc:      SpO2: 100% 100% 98% 100%  Weight:      Height:          Constitutional: NAD, tearful  Eyes: PERTLA, lids and conjunctivae normal ENMT: Mucous membranes are moist. Posterior pharynx clear of any exudate or lesions.   Neck: normal, supple, no masses, no thyromegaly Respiratory: clear to auscultation bilaterally, no wheezing, no crackles. Normal respiratory effort.   Cardiovascular: Rate ~120 and regular. No significant JVD. Abdomen: No distension, soft, tender throughout, no rebound pain or guarding, no masses palpated. Bowel sounds appreciated.  Musculoskeletal: no clubbing / cyanosis. No joint deformity upper and lower extremities.   Skin: no significant rashes, lesions, ulcers. Warm, dry, well-perfused. Neurologic: CN 2-12 grossly intact. Sensation intact. Strength 5/5 in all 4 limbs.  Psychiatric: Alert and oriented x 3. Calm, cooperative.     Labs on Admission: I have personally reviewed following labs and imaging studies  CBC: Recent Labs  Lab 07/09/17 0041 07/10/17 0033  WBC 6.4 4.4  NEUTROABS 2.5  --   HGB 11.7* 10.6*  HCT 35.4* 31.6*  MCV 86.8 85.2  PLT 314 412   Basic Metabolic Panel: Recent Labs  Lab  07/09/17 0041 07/10/17 0033  NA 140 137  K 4.2 4.3  CL 100* 101  CO2 30 25  GLUCOSE 93 204*  BUN 14 9  CREATININE 0.38* 0.54  CALCIUM 9.7 9.4   GFR: Estimated Creatinine Clearance: 98.4 mL/min (by C-G formula based on SCr of 0.54 mg/dL). Liver Function Tests: Recent Labs  Lab 07/09/17 0041 07/10/17 0033  AST 33 19  ALT 25 21  ALKPHOS 91 81  BILITOT 0.5 0.8  PROT 8.6* 8.1  ALBUMIN 3.9 3.5   Recent Labs  Lab 07/10/17 0033  LIPASE 20   No results for input(s): AMMONIA in the last 168 hours. Coagulation Profile: Recent Labs  Lab 07/10/17 0033  INR 1.09   Cardiac Enzymes: No results for input(s): CKTOTAL, CKMB, CKMBINDEX, TROPONINI in the last 168 hours. BNP (last 3 results) No results for input(s): PROBNP in the last 8760 hours. HbA1C: No results for input(s): HGBA1C in the last 72 hours. CBG: Recent Labs  Lab 07/10/17 0015  GLUCAP 196*   Lipid Profile: No results for input(s): CHOL, HDL, LDLCALC, TRIG, CHOLHDL, LDLDIRECT in the last 72 hours. Thyroid Function Tests: No results for input(s): TSH, T4TOTAL, FREET4, T3FREE, THYROIDAB in the last 72 hours. Anemia Panel: No results for input(s): VITAMINB12, FOLATE, FERRITIN, TIBC, IRON, RETICCTPCT in the last 72 hours. Urine analysis:    Component Value Date/Time   COLORURINE YELLOW 07/09/2017 2257   APPEARANCEUR CLEAR 07/09/2017 2257   LABSPEC 1.013 07/09/2017 2257   PHURINE 5.0 07/09/2017 2257   GLUCOSEU NEGATIVE 07/09/2017 2257   HGBUR MODERATE (A) 07/09/2017 2257   BILIRUBINUR NEGATIVE 07/09/2017 2257   BILIRUBINUR negative 01/02/2015 1717   KETONESUR 20 (A) 07/09/2017 2257   PROTEINUR 30 (A) 07/09/2017 2257   UROBILINOGEN 0.2 01/13/2015 0223   NITRITE NEGATIVE 07/09/2017 2257   LEUKOCYTESUR NEGATIVE 07/09/2017 2257   Sepsis Labs: @LABRCNTIP (procalcitonin:4,lacticidven:4) ) Recent Results (from the past 240 hour(s))  Wound or Superficial Culture     Status: None (Preliminary result)    Collection Time: 07/09/17 12:41 AM  Result Value Ref Range Status   Specimen Description   Final    ABSCESS RIGHT BUTTOCK Performed at [facility], [place] [address], [place], [place] [address]    Special Requests   Final    Normal Performed at [facility], [place] [address] [place], [place] [address]    Gram Stain   Final    FEW WBC PRESENT, PREDOMINANTLY PMN MODERATE GRAM POSITIVE RODS FEW GRAM POSITIVE COCCI RARE GRAM NEGATIVE RODS Performed at [facility], [place] [address], [place], [place] [address]    Culture PENDING  Incomplete   Report Status PENDING  Incomplete     Radiological Exams on Admission: Dg Chest 2 View  Result Date: 07/10/2017 CLINICAL DATA:  Nausea, chest pain, shortness of breath. EXAM: CHEST - 2 VIEW COMPARISON:  Chest CT 05/27/2017 FINDINGS: The cardiomediastinal contours are normal. The lungs are clear. Pulmonary vasculature is normal. No consolidation, pleural effusion, or pneumothorax. No acute osseous abnormalities are seen. IMPRESSION: Normal radiographs of the chest. Electronically Signed   By: Jeb Levering M.D.   On: 07/10/2017 00:26   Ct Pelvis W Contrast  Result Date: 07/09/2017 CLINICAL DATA:  Gluteal abscess.  Drain in place. EXAM: CT PELVIS WITH CONTRAST TECHNIQUE: Multidetector CT imaging of the pelvis was performed using the standard protocol following the bolus administration of intravenous contrast. CONTRAST:  65m ISOVUE-300 IOPAMIDOL (ISOVUE-300) INJECTION 61% COMPARISON:  Abdominopelvic CT 06/19/2017 FINDINGS: Urinary Tract: Distal ureters are decompressed. Urinary bladder is mildly distended. No bladder wall thickening. Bowel: Colonic wall thickening on prior exam has diminished in the interim, with persistent sigmoid and rectal wall thickening. No evidence of acute bowel inflammation. Vascular/Lymphatic: Few prominent inguinal nodes are likely reactive. No acute vascular findings. Reproductive:   Uterus and adnexa are unremarkable. Other: Right gluteal drainage catheter is unchanged in position. Unchanged appearance of the adjacent soft tissue density without dominant fluid  collection. Soft tissue fullness tracks in the gluteal crease to the anal junction without significant no evidence of new fluid collection. No significant change from prior exam or progressive inflammatory change. No intrapelvic fluid collection or abscess. Musculoskeletal: There are no acute or suspicious osseous abnormalities. IMPRESSION: 1. Right gluteal drainage catheter in place, unchanged in appearance with associated soft tissue density in the subcutaneous tissues tracking to the anorectal region, suspicious for anocutaneous fistula. Overall no significant change from prior. No increased inflammation or new fluid collection. 2. Persistent but improved wall thickening of the rectosigmoid colon. Electronically Signed   By: Jeb Levering M.D.   On: 07/09/2017 05:15    EKG: Independently reviewed. Sinus tachycardia (rate 108).   Assessment/Plan  1. Intractable nausea & vomiting  - Presents with one day of nausea and non-bloody vomiting, unable to keep her medications down  - She continues to vomit in ED despite antiemetics  - Abdomen is soft and bowel sounds active  - Gastroparesis is suspected  - Continue scheduled Reglan, prn Zofran and Phenergan  - Continue IVF hydration, monitor and replete lytes    2. Abdominal pain  - This appears to be chronic, but she reports worsening for past 3 days and began vomiting 4/11 after leaving ED  - Abd soft on exam with active bowel sounds  - She takes opiate analgesia at home, currently has intractable N/V and will be treated with IV analgesia for now and converted back to PO as tolerated  - Gastroparesis may be playing a role; will continue scheduled Reglan, converted to IV for now    3. Gluteal abscess  - Follows with surgery at [facility] and has drain in place  - There is  surrounding cellulitis, but no systemic features of infection  - She was started on oral abx 4/11 in ED, but no longer able to tolerate anything by mouth; will treat with IV doxy for now    4. Insulin-dependent DM  - A1c was only 5.7% in January 2019, previously much higher  - Managed at home with Lantus 18 units qHS and Humalog 0-9 units TID  - Check CBG's, continue Lantus with 9 units for now given N/V, and use SSI with Novolog    5. Normocytic anemia  - Hgb is 10.6 on admission  - Appears to be stable and there is no bleeding  - She will continue iron and folate supplementation     DVT prophylaxis: SCD's  Code Status: Full  Family Communication: Discussed with patient Consults called: None Admission status: Observation    Vianne Bulls, MD [facility] Pager [phone]  If 7PM-7AM, please contact night-coverage [email or web address] Password [facility]  07/10/2017, 4:41 AM

## 2017-07-10 NOTE — ED Notes (Signed)
Patient transported to X-ray 

## 2017-07-10 NOTE — Progress Notes (Signed)
Initial Nutrition Assessment  DOCUMENTATION CODES:   Non-severe (moderate) malnutrition in context of chronic illness  INTERVENTION:   - Glucerna Shake po TID, each supplement provides 220 kcal and 10 grams of protein  - MVI with minerals daily  NUTRITION DIAGNOSIS:   Moderate Malnutrition related to chronic illness(type 1 diabetes mellitus, gastroparesis) as evidenced by percent weight loss, mild fat depletion, moderate fat depletion, mild muscle depletion, moderate muscle depletion(25.4% weight loss in 4 months).  GOAL:   Patient will meet greater than or equal to 90% of their needs  MONITOR:   PO intake, Supplement acceptance, Diet advancement, Labs, Weight trends  REASON FOR ASSESSMENT:   Malnutrition Screening Tool    ASSESSMENT:   27 year old female who presented to ED with complaints of abdominal pain and nausea with emesis. Pt recently hospitalized for SIRS secondary to colitis. PMH significant for insulin-dependent diabetes mellitus, gastroparesis, hypertension, chronic normocytic anemia, and gluteal abscess. Pt admitted for intractable nausea and vomiting.  Spoke with pt at bedside who reports typically having a good appetite. Pt states that she typically eats 3 meals daily.  Pt endorses difficulty chewing and states that several of her teeth are broken. Pt reports she is waiting on a set of dentures.  Pt reports her UBW as 150-155 lbs and that she last weighed this in January 2019. Per weight history in chart, pt has progressively lost weight since December 2018: - 181 lbs 3 oz on 03/11/17 - 174 lbs 9 oz 03/17/17 - 165 lbs 10 oz on 03/20/17 - 162 lbs 4 oz on 03/21/17 - 151 lbs 10 oz 04/03/17 - 146 lbs 10 oz 04/12/17 - 137 lbs 13 oz on 05/07/17 - 135 lbs 13 oz on 05/13/17  Based on weight history, pt has lost 46 lbs since December 2018. This is a 25.4% weight loss which is severe and significant for timeframe. Pt denies intentional weight loss and states she has  been struggling with "malnutrition" and is not sure what to do. RD provided education regarding high-calorie, high-protein nutrition therapy.  Medications reviewed and include: 1 mg folic acid daily, sliding scale Novolog, 9 units Lantus, Creon TID with meals, 10 mg Reglan TID, IV antibiotics, 20 mg IV Pepcid BID, PRN Zofran  Labs reviewed: hemoglobin 10.6, HCT 31.6, hemoglobin A1C 5.7 on 04/10/17 CBG's: 126, 181, 196  NUTRITION - FOCUSED PHYSICAL EXAM:\    Most Recent Value  Orbital Region  Mild depletion  Upper Arm Region  No depletion  Thoracic and Lumbar Region  Mild depletion  Buccal Region  No depletion  Temple Region  Mild depletion  Clavicle Bone Region  Moderate depletion  Clavicle and Acromion Bone Region  Moderate depletion  Scapular Bone Region  Unable to assess  Dorsal Hand  Mild depletion  Patellar Region  Moderate depletion  Anterior Thigh Region  Mild depletion  Posterior Calf Region  Moderate depletion  Edema (RD Assessment)  None  Hair  Reviewed  Eyes  Reviewed  Mouth  Reviewed  Skin  Reviewed  Nails  Reviewed      Diet Order:  Diet full liquid Room service appropriate? Yes; Fluid consistency: Thin  EDUCATION NEEDS:   Education needs have been addressed  Skin:  Skin Assessment: Reviewed RN Assessment  Last BM:  07/12/17 small type 7  Height:   Ht Readings from Last 1 Encounters:  07/10/17 5\' 7"  (1.702 m)    Weight:   Wt Readings from Last 1 Encounters:  07/10/17 130 lb (59 kg)  Ideal Body Weight:  61.4 kg  BMI:  Body mass index is 20.36 kg/m.  Estimated Nutritional Needs:   Kcal:  1650-1850 kcal/day  Protein:  80-90 grams/day  Fluid:  > 1.6 L/day    Earma Reading, MS, RD, LDN Pager: [phone] Weekend/After Hours: [phone]

## 2017-07-10 NOTE — Progress Notes (Signed)
A consult was received from an ED physician for Vancomycin and Zosyn per pharmacy dosing.  The patient's profile has been reviewed for ht/wt/allergies/indication/available labs.   A one time order has been placed for Vancomycin 1gm iv x1, and Zosyn 3.375gm iv x1.  Further antibiotics/pharmacy consults should be ordered by admitting physician if indicated.                       Thank you, Aleene Davidson Crowford 07/10/2017  1:42 AM

## 2017-07-11 DIAGNOSIS — Z8249 Family history of ischemic heart disease and other diseases of the circulatory system: Secondary | ICD-10-CM | POA: Diagnosis not present

## 2017-07-11 DIAGNOSIS — I10 Essential (primary) hypertension: Secondary | ICD-10-CM | POA: Diagnosis not present

## 2017-07-11 DIAGNOSIS — R0602 Shortness of breath: Secondary | ICD-10-CM | POA: Diagnosis not present

## 2017-07-11 DIAGNOSIS — E1043 Type 1 diabetes mellitus with diabetic autonomic (poly)neuropathy: Secondary | ICD-10-CM | POA: Diagnosis present

## 2017-07-11 DIAGNOSIS — Z9049 Acquired absence of other specified parts of digestive tract: Secondary | ICD-10-CM | POA: Diagnosis not present

## 2017-07-11 DIAGNOSIS — G43A1 Cyclical vomiting, intractable: Secondary | ICD-10-CM | POA: Diagnosis not present

## 2017-07-11 DIAGNOSIS — R079 Chest pain, unspecified: Secondary | ICD-10-CM | POA: Diagnosis not present

## 2017-07-11 DIAGNOSIS — Z833 Family history of diabetes mellitus: Secondary | ICD-10-CM | POA: Diagnosis not present

## 2017-07-11 DIAGNOSIS — M797 Fibromyalgia: Secondary | ICD-10-CM | POA: Diagnosis present

## 2017-07-11 DIAGNOSIS — Z9119 Patient's noncompliance with other medical treatment and regimen: Secondary | ICD-10-CM | POA: Diagnosis not present

## 2017-07-11 DIAGNOSIS — N739 Female pelvic inflammatory disease, unspecified: Secondary | ICD-10-CM | POA: Diagnosis present

## 2017-07-11 DIAGNOSIS — L0231 Cutaneous abscess of buttock: Secondary | ICD-10-CM | POA: Diagnosis not present

## 2017-07-11 DIAGNOSIS — L03317 Cellulitis of buttock: Secondary | ICD-10-CM | POA: Diagnosis not present

## 2017-07-11 DIAGNOSIS — R112 Nausea with vomiting, unspecified: Secondary | ICD-10-CM | POA: Diagnosis not present

## 2017-07-11 DIAGNOSIS — G8929 Other chronic pain: Secondary | ICD-10-CM | POA: Diagnosis present

## 2017-07-11 DIAGNOSIS — I959 Hypotension, unspecified: Secondary | ICD-10-CM | POA: Diagnosis present

## 2017-07-11 DIAGNOSIS — K3184 Gastroparesis: Secondary | ICD-10-CM | POA: Diagnosis present

## 2017-07-11 DIAGNOSIS — D649 Anemia, unspecified: Secondary | ICD-10-CM | POA: Diagnosis present

## 2017-07-11 DIAGNOSIS — Z794 Long term (current) use of insulin: Secondary | ICD-10-CM | POA: Diagnosis not present

## 2017-07-11 LAB — GLUCOSE, CAPILLARY
Glucose-Capillary: 100 mg/dL — ABNORMAL HIGH (ref 65–99)
Glucose-Capillary: 101 mg/dL — ABNORMAL HIGH (ref 65–99)
Glucose-Capillary: 102 mg/dL — ABNORMAL HIGH (ref 65–99)
Glucose-Capillary: 103 mg/dL — ABNORMAL HIGH (ref 65–99)
Glucose-Capillary: 116 mg/dL — ABNORMAL HIGH (ref 65–99)
Glucose-Capillary: 165 mg/dL — ABNORMAL HIGH (ref 65–99)
Glucose-Capillary: 59 mg/dL — ABNORMAL LOW (ref 65–99)
Glucose-Capillary: 82 mg/dL (ref 65–99)

## 2017-07-11 LAB — AEROBIC CULTURE W GRAM STAIN (SUPERFICIAL SPECIMEN): Special Requests: NORMAL

## 2017-07-11 LAB — URINE CULTURE: Culture: NO GROWTH

## 2017-07-11 MED ORDER — KETOROLAC TROMETHAMINE 30 MG/ML IJ SOLN
30.0000 mg | Freq: Once | INTRAMUSCULAR | Status: AC
  Administered 2017-07-11: 30 mg via INTRAVENOUS
  Filled 2017-07-11: qty 1

## 2017-07-11 MED ORDER — OXYCODONE-ACETAMINOPHEN 5-325 MG PO TABS
1.0000 | ORAL_TABLET | Freq: Once | ORAL | Status: AC
  Administered 2017-07-12: 1 via ORAL
  Filled 2017-07-11 (×2): qty 1

## 2017-07-11 MED ORDER — HYDRALAZINE HCL 20 MG/ML IJ SOLN
10.0000 mg | Freq: Four times a day (QID) | INTRAMUSCULAR | Status: DC | PRN
  Administered 2017-07-11: 10 mg via INTRAVENOUS
  Filled 2017-07-11: qty 1

## 2017-07-11 MED ORDER — OXYCODONE-ACETAMINOPHEN 5-325 MG PO TABS
1.0000 | ORAL_TABLET | ORAL | Status: DC | PRN
Start: 2017-07-11 — End: 2017-07-11
  Administered 2017-07-11: 1 via ORAL
  Filled 2017-07-11: qty 1

## 2017-07-11 MED ORDER — OXYCODONE HCL 5 MG PO TABS
5.0000 mg | ORAL_TABLET | ORAL | Status: DC | PRN
  Administered 2017-07-11 – 2017-07-13 (×4): 5 mg via ORAL
  Filled 2017-07-11 (×5): qty 1

## 2017-07-11 MED ORDER — HYDROMORPHONE HCL 1 MG/ML IJ SOLN
1.0000 mg | Freq: Once | INTRAMUSCULAR | Status: AC
  Administered 2017-07-11: 1 mg via INTRAVENOUS
  Filled 2017-07-11: qty 1

## 2017-07-11 MED ORDER — METOCLOPRAMIDE HCL 5 MG PO TABS
5.0000 mg | ORAL_TABLET | Freq: Three times a day (TID) | ORAL | Status: DC
  Administered 2017-07-11 (×2): 5 mg via ORAL
  Filled 2017-07-11 (×3): qty 1

## 2017-07-11 MED ORDER — HYOSCYAMINE SULFATE 0.5 MG/ML IJ SOLN
0.5000 mg | Freq: Four times a day (QID) | INTRAMUSCULAR | Status: DC | PRN
  Administered 2017-07-12: 0.5 mg via INTRAVENOUS
  Filled 2017-07-11 (×2): qty 1

## 2017-07-11 NOTE — Progress Notes (Signed)
Patient Demographics:    [name], is a 27 y.o. female, DOB - [birth year], VVK:[id number]  Admit date - 07/09/2017   Admitting Physician Vianne Bulls, MD  Outpatient Primary MD for the patient is Patient, No Pcp Per  LOS - 0   Chief Complaint  Patient presents with  . Hypotension  . Emesis  . Abdominal Pain        Subjective:    [name] today has no fevers, no emesis,  No chest pain, patient unhappy about lack of IV narcotics, refusing to eat, blood sugars dropping, had soft BM  Assessment  & Plan :    Principal Problem:   Intractable nausea and vomiting Active Problems:   Chronic abdominal pain   Insulin dependent diabetes mellitus (HCC)   Cellulitis and abscess of buttock   Essential hypertension   Normocytic anemia  Brief Summary:-  27 y.o. female with medical history significant for insulin-dependent diabetes (since age 16), chronic normocytic anemia, chronic abdominal pain, and gluteal abscess followed by surgery at [facility] admitted on 07/10/17 with intractable emesis and poor oral intake.     Plan:- 1) intractable emesis-may be related to gastroparesis, advance diet slowly, hydrate IV until oral intake is reliable, avoid IV narcotics and this may contribute to decreased GI motility and further nausea vomiting, Reglan as prescribed, as needed Zofran,  2)DM1-blood sugar down below 60 due to poor oral intake, allow some permissive Hyperglycemia rather than risk life-threatening hypoglycemia in a patient with unreliable oral intake. Use Novolog/Humalog Sliding scale insulin with Accu-Cheks/Fingersticks as ordered  3)Pelvic Abscess-to admission recently started on Keflex and Bactrim, currently on doxycycline, patient has an appointment with Dr [name]  on 0/07/1100 , buttock drains with scant drainage, continue doxycycline  4)Chronic Abd Pain-no evidence of acute abdomen,  patient had a soft bowel movement on 07/11/2017, patient is unhappy with my unwillingness to give IV narcotics, I explained the rationale for not prescribing IV narcotics, please see #1 above  Code Status : Full code  Disposition Plan  :  Home   DVT Prophylaxis  :  SCDs  Lab Results  Component Value Date   PLT 280 07/10/2017    Inpatient Medications  Scheduled Meds: . cholestyramine light  4 g Oral TID  . citalopram  20 mg Oral Daily  . feeding supplement (GLUCERNA SHAKE)  237 mL Oral TID BM  . folic acid  1 mg Oral Daily  . gabapentin  300 mg Oral BID  . insulin aspart  0-9 Units Subcutaneous Q4H  . insulin glargine  9 Units Subcutaneous QHS  . lipase/protease/amylase  36,000 Units Oral TID WC  . metoCLOPramide  5 mg Oral TID AC  . multivitamin with minerals  1 tablet Oral Daily  . sodium chloride flush  3 mL Intravenous Q12H  . sucralfate  1 g Oral TID WC & HS   Continuous Infusions: . sodium chloride 110 mL/hr at 07/11/17 1339  . doxycycline (VIBRAMYCIN) IV Stopped (07/11/17 0730)  . famotidine (PEPCID) IV Stopped (07/11/17 1016)   PRN Meds:.acetaminophen **OR** acetaminophen, cyclobenzaprine, hydrALAZINE, ondansetron **OR** ondansetron (ZOFRAN) IV, oxyCODONE    Anti-infectives (From admission, onward)   Start     Dose/Rate Route Frequency Ordered Stop  07/10/17 0600  doxycycline (VIBRAMYCIN) 100 mg in sodium chloride 0.9 % 250 mL IVPB     100 mg 125 mL/hr over 120 Minutes Intravenous Every 12 hours 07/10/17 0441     07/10/17 0145  piperacillin-tazobactam (ZOSYN) IVPB 3.375 g     3.375 g 100 mL/hr over 30 Minutes Intravenous  Once 07/10/17 0142 07/10/17 0252        Objective:   Vitals:   07/10/17 0700 07/10/17 1258 07/10/17 2025 07/11/17 1411  BP: (!) 150/107 (!) 160/107 (!) 160/110 (!) 169/110  Pulse: (!) 111 (!) 110 (!) 111 (!) 107  Resp: 18 16 15 16   Temp: 97.6 F (36.4 C) 98.2 F (36.8 C) 98.2 F (36.8 C) 97.8 F (36.6 C)  TempSrc:  Oral Oral Oral    SpO2: 100% 99% 100% 100%  Weight:      Height:        Wt Readings from Last 3 Encounters:  07/10/17 59 kg (130 lb)  07/09/17 59 kg (130 lb)  06/27/17 59 kg (130 lb)     Intake/Output Summary (Last 24 hours) at 07/11/2017 1719 Last data filed at 07/11/2017 1140 Gross per 24 hour  Intake 2450 ml  Output 4 ml  Net 2446 ml     Physical Exam  Gen:- Awake Alert, in no acute distress HEENT:- [place].AT, No sclera icterus Neck-Supple Neck,No JVD,.  Lungs-  CTAB , good air movement CV- S1, S2 normal Abd-  +ve B.Sounds, Abd Soft, No tenderness,    Extremity/Skin:-Buttock area with drains with scant drainage  psych-affect is appropriate, oriented x3 Neuro-no new focal deficits, no tremors   Data Review:   Micro Results Recent Results (from the past 240 hour(s))  Blood Culture (routine x 2)     Status: None (Preliminary result)   Collection Time: 07/09/17 12:41 AM  Result Value Ref Range Status   Specimen Description   Final    BLOOD RIGHT FOREARM Performed at [facility], [place] [address], [place], [place] [address]    Special Requests   Final    BOTTLES DRAWN AEROBIC AND ANAEROBIC Blood Culture results may not be optimal due to an excessive volume of blood received in culture bottles Performed at [place] [address] [place], [place] [address]    Culture   Final    NO GROWTH 2 DAYS Performed at [place] [address] [place], [place] [address]    Report Status PENDING  Incomplete  Blood Culture (routine x 2)     Status: None (Preliminary result)   Collection Time: 07/09/17 12:41 AM  Result Value Ref Range Status   Specimen Description   Final    BLOOD LEFT ANTECUBITAL Performed at [place] [address], [place], [place] [address]    Special Requests   Final    BOTTLES DRAWN AEROBIC ONLY Blood Culture results may not be optimal due to an inadequate volume of blood received in culture  bottles Performed at [place] [address] [place], [place] [address]    Culture   Final    NO GROWTH 2 DAYS Performed at [place] [address] [place], [place] [address]    Report Status PENDING  Incomplete  Wound or Superficial Culture     Status: Abnormal   Collection Time: 07/09/17 12:41 AM  Result Value Ref Range Status   Specimen Description   Final    ABSCESS RIGHT BUTTOCK  Performed at [facility], [place] [address] [place], [place] [address]    Special Requests   Final    Normal Performed at [facility], [place] [address] [place], [place] [address]    Gram Stain   Final    FEW WBC PRESENT, PREDOMINANTLY PMN MODERATE GRAM POSITIVE RODS FEW GRAM POSITIVE COCCI RARE GRAM NEGATIVE RODS Performed at [facility], [place] [address], [place], [place] [address]    Culture MULTIPLE ORGANISMS PRESENT, NONE PREDOMINANT (A)  Final   Report Status 07/11/2017 FINAL  Final  Urine Culture     Status: None   Collection Time: 07/09/17 11:42 PM  Result Value Ref Range Status   Specimen Description   Final    URINE, CLEAN CATCH Performed at [facility], [place] [address] [place], [place] [address]    Special Requests   Final    NONE Performed at [facility], [place] [address] [place], [place] [address]    Culture   Final    NO GROWTH Performed at [facility], [place] [address] [place], [place] [address]    Report Status 07/11/2017 FINAL  Final  Culture, blood (Routine x 2)     Status: None (Preliminary result)   Collection Time: 07/10/17 12:33 AM  Result Value Ref Range Status   Specimen Description   Final    BLOOD LEFT FOREARM Performed at [place] [address] [place], [place] [address]    Special Requests   Final    BOTTLES DRAWN AEROBIC AND ANAEROBIC Blood Culture adequate volume Performed at [facility] [address] [place], [place] [address]    Culture   Final    NO GROWTH 1 DAY Performed at [facility], [place] [address], [place], [place] [address]    Report Status PENDING  Incomplete  Culture, blood (Routine x 2)     Status: None (Preliminary result)   Collection Time: 07/10/17  7:41 AM  Result Value Ref Range Status   Specimen Description   Final    BLOOD RIGHT ANTECUBITAL Performed at [place] [address] [place], [place] [address]    Special Requests   Final    BOTTLES DRAWN AEROBIC AND ANAEROBIC Blood Culture adequate volume Performed at [place] [address] [place], [place] [address]    Culture   Final    NO GROWTH 1 DAY Performed at [facility], [place] [address] [place],  [address]    Report Status PENDING  Incomplete    Radiology Reports Dg Chest 2 View  Result Date: 07/10/2017 CLINICAL DATA:  Nausea, chest pain, shortness of breath. EXAM: CHEST - 2 VIEW COMPARISON:  Chest CT 05/27/2017 FINDINGS: The cardiomediastinal contours are normal. The lungs are clear. Pulmonary vasculature is normal. No consolidation, pleural effusion, or pneumothorax. No acute osseous abnormalities are seen. IMPRESSION: Normal radiographs of the chest. Electronically Signed   By: Jeb Levering M.D.   On: 07/10/2017 00:26   Ct Pelvis W Contrast  Result Date: 07/09/2017 CLINICAL DATA:  Gluteal abscess.  Drain in place. EXAM: CT PELVIS WITH CONTRAST TECHNIQUE: Multidetector CT imaging of the pelvis was performed using the standard protocol following the bolus administration of intravenous contrast. CONTRAST:  19m ISOVUE-300 IOPAMIDOL (ISOVUE-300) INJECTION 61% COMPARISON:  Abdominopelvic CT 06/19/2017 FINDINGS: Urinary Tract: Distal ureters are decompressed. Urinary bladder is mildly distended. No bladder wall  thickening. Bowel: Colonic wall thickening on prior exam has diminished in the interim, with persistent sigmoid  and rectal wall thickening. No evidence of acute bowel inflammation. Vascular/Lymphatic: Few prominent inguinal nodes are likely reactive. No acute vascular findings. Reproductive:  Uterus and adnexa are unremarkable. Other: Right gluteal drainage catheter is unchanged in position. Unchanged appearance of the adjacent soft tissue density without dominant fluid collection. Soft tissue fullness tracks in the gluteal crease to the anal junction without significant no evidence of new fluid collection. No significant change from prior exam or progressive inflammatory change. No intrapelvic fluid collection or abscess. Musculoskeletal: There are no acute or suspicious osseous abnormalities. IMPRESSION: 1. Right gluteal drainage catheter in place, unchanged in appearance with associated soft tissue density in the subcutaneous tissues tracking to the anorectal region, suspicious for anocutaneous fistula. Overall no significant change from prior. No increased inflammation or new fluid collection. 2. Persistent but improved wall thickening of the rectosigmoid colon. Electronically Signed   By: Jeb Levering M.D.   On: 07/09/2017 05:15   Ct Abdomen Pelvis W Contrast  Result Date: 06/19/2017 CLINICAL DATA:  Left mid abdominal pain and diarrhea 1 week. Crohn's disease. EXAM: CT ABDOMEN AND PELVIS WITH CONTRAST TECHNIQUE: Multidetector CT imaging of the abdomen and pelvis was performed using the standard protocol following bolus administration of intravenous contrast. CONTRAST:  19m ISOVUE-300 IOPAMIDOL (ISOVUE-300) INJECTION 61% COMPARISON:  05/27/2017 and 01/15/2016 as well as 04/10/2017 FINDINGS: Lower chest: Lung bases demonstrate minimal opacification of the left lower lobe likely atelectasis. No effusion. Hepatobiliary: Previous cholecystectomy. Liver and biliary tree are normal. Pancreas: Normal. Spleen: Normal. Adrenals/Urinary Tract: Adrenal glands are normal. Kidneys are normal in size without hydronephrosis or  nephrolithiasis. Ureters and bladder are unremarkable. Stomach/Bowel: Stomach is normal. Small bowel is within normal. Appendix is normal. There is minimal wall thickening throughout the colon likely due to mild colitis associated due to patient's Crohn's disease. No free fluid or free peritoneal air. Vascular/Lymphatic: Within normal. Reproductive: Within normal. 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 Musculoskeletal: Within normal. IMPRESSION: Mild wall thickening throughout the colon likely mild acute colitis due to patient's Crohn's disease. Changes over the subcutaneous region of the right gluteal region with surgical drain without significant change. 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 Minimal left base opacification likely atelectasis. Early infection is possible. Electronically Signed   By: DMarin OlpM.D.   On: 06/19/2017 14:30     CBC Recent Labs  Lab 07/09/17 0041 07/10/17 0033  WBC 6.4 4.4  HGB 11.7* 10.6*  HCT 35.4* 31.6*  PLT 314 280  MCV 86.8 85.2  MCH 28.7 28.6  MCHC 33.1 33.5  RDW 13.1 13.0  LYMPHSABS 3.2  --   MONOABS 0.6  --   EOSABS 0.1  --   BASOSABS 0.0  --     Chemistries  Recent Labs  Lab 07/09/17 0041 07/10/17 0033 07/10/17 0741  NA 140 137 139  K 4.2 4.3 3.6  CL 100* 101 105  CO2 30 25 24   GLUCOSE 93 204* 143*  BUN 14 9 8   CREATININE 0.38* 0.54 0.48  CALCIUM 9.7 9.4 8.8*  AST 33 19  --   ALT 25 21  --   ALKPHOS 91 81  --   BILITOT 0.5 0.8  --    ------------------------------------------------------------------------------------------------------------------ No results for input(s): CHOL, HDL,  LDLCALC, TRIG, CHOLHDL, LDLDIRECT in the last 72 hours.  Lab Results  Component Value Date   HGBA1C 5.7 (H) 04/10/2017    ------------------------------------------------------------------------------------------------------------------ No results for input(s): TSH, T4TOTAL, T3FREE, THYROIDAB in the last 72 hours.  Invalid input(s): FREET3 ------------------------------------------------------------------------------------------------------------------ No results for input(s): VITAMINB12, FOLATE, FERRITIN, TIBC, IRON, RETICCTPCT in the last 72 hours.  Coagulation profile Recent Labs  Lab 07/10/17 0033  INR 1.09    No results for input(s): DDIMER in the last 72 hours.  Cardiac Enzymes No results for input(s): CKMB, TROPONINI, MYOGLOBIN in the last 168 hours.  Invalid input(s): CK ------------------------------------------------------------------------------------------------------------------ No results found for: BNP   Roxan Hockey M.D on 07/11/2017 at 5:19 PM  Between 7am to 7pm - Pager - [phone]  After 7pm go to [email or web address] - password [email or web address]  [facility] -  Office  [phone]   Voice Recognition Viviann Spare dictation system was used to create this note, attempts have been made to correct errors. Please contact the author with questions and/or clarifications.

## 2017-07-11 NOTE — Progress Notes (Signed)
Patient refused Novolog and Lantus. PCP to be notified

## 2017-07-11 NOTE — Progress Notes (Signed)
Patient rated 10/10 pain in her abdomen. Patient stated that the oral analgesics are not stopping the pain.  PCP on call was notified.

## 2017-07-11 NOTE — Progress Notes (Signed)
PCP notified RN about new orders.  Patient stated that she did not think she could make it all night without IV pain medications.  Patient requested to speak with the PCP. The patient spoke over the phone with the PCP.

## 2017-07-11 NOTE — Progress Notes (Signed)
Patient states pain is 10/10 again. Patient states Oxycodone does not help relieve pain. PCP was notified again.

## 2017-07-11 NOTE — Plan of Care (Signed)
Patient stable, BP improved after receiving one dose of IV Hydralazine.  Patient c/o pain, vomiting and frequent stools (RN has not been a witness to any vomitus or stools, patient independent up to bathroom).  Patient not eating, says she cannot keep any food down.  Receiving IV Zofran with minimal improvement per patient.  Patient very tearful since not receiving any IV medication for pain.  Percocet administered with no relief.  Patient stated she had used plain oxycodone in the past, notified MD, OxyIR administered as per order.

## 2017-07-11 NOTE — Progress Notes (Signed)
Patient crying all the time now. Not happy about taking Percocet. Patient stated that she just wants to go home. Patient's significant other persuaded the patient that being at home would not be beneficial at this point in time.

## 2017-07-12 DIAGNOSIS — I10 Essential (primary) hypertension: Secondary | ICD-10-CM

## 2017-07-12 LAB — BASIC METABOLIC PANEL
Anion gap: 15 (ref 5–15)
BUN: 7 mg/dL (ref 6–20)
CO2: 22 mmol/L (ref 22–32)
Calcium: 8.9 mg/dL (ref 8.9–10.3)
Chloride: 101 mmol/L (ref 101–111)
Creatinine, Ser: 0.51 mg/dL (ref 0.44–1.00)
GFR calc Af Amer: >60 mL/min (ref >60)
GFR calc non Af Amer: >60 mL/min (ref >60)
Glucose, Bld: 155 mg/dL — ABNORMAL HIGH (ref 65–99)
Potassium: 3.2 mmol/L — ABNORMAL LOW (ref 3.5–5.1)
Sodium: 138 mmol/L (ref 135–145)

## 2017-07-12 LAB — CBC
HCT: 29.6 % — ABNORMAL LOW (ref 36.0–46.0)
Hemoglobin: 10.3 g/dL — ABNORMAL LOW (ref 12.0–15.0)
MCH: 28.9 pg (ref 26.0–34.0)
MCHC: 34.8 g/dL (ref 30.0–36.0)
MCV: 83.1 fL (ref 78.0–100.0)
Platelets: 255 10*3/uL (ref 150–400)
RBC: 3.56 MIL/uL — ABNORMAL LOW (ref 3.87–5.11)
RDW: 12.8 % (ref 11.5–15.5)
WBC: 4.5 10*3/uL (ref 4.0–10.5)

## 2017-07-12 LAB — GLUCOSE, CAPILLARY
Glucose-Capillary: 93 mg/dL (ref 65–99)
Glucose-Capillary: 113 mg/dL — ABNORMAL HIGH (ref 65–99)
Glucose-Capillary: 95 mg/dL (ref 65–99)
Glucose-Capillary: 301 mg/dL — ABNORMAL HIGH (ref 65–99)
Glucose-Capillary: 265 mg/dL — ABNORMAL HIGH (ref 65–99)

## 2017-07-12 MED ORDER — POTASSIUM CHLORIDE CRYS ER 20 MEQ PO TBCR
40.0000 meq | EXTENDED_RELEASE_TABLET | Freq: Once | ORAL | Status: AC
Start: 2017-07-12 — End: 2017-07-12
  Administered 2017-07-12: 40 meq via ORAL
  Filled 2017-07-12: qty 2

## 2017-07-12 MED ORDER — GI COCKTAIL ~~LOC~~: 30.0000 mL | Freq: Three times a day (TID) | ORAL | Status: DC | PRN

## 2017-07-12 MED ORDER — CALCIUM CARBONATE ANTACID 500 MG PO CHEW
1.0000 | CHEWABLE_TABLET | Freq: Three times a day (TID) | ORAL | Status: DC
  Administered 2017-07-12 – 2017-07-13 (×4): 200 mg via ORAL
  Filled 2017-07-12 (×4): qty 1

## 2017-07-12 MED ORDER — METOCLOPRAMIDE HCL 5 MG/ML IJ SOLN
10.0000 mg | Freq: Three times a day (TID) | INTRAMUSCULAR | Status: DC
  Administered 2017-07-12 – 2017-07-13 (×4): 10 mg via INTRAVENOUS
  Filled 2017-07-12 (×4): qty 2

## 2017-07-12 MED ORDER — ALPRAZOLAM 0.25 MG PO TABS
0.2500 mg | ORAL_TABLET | Freq: Once | ORAL | Status: DC
  Filled 2017-07-12: qty 1

## 2017-07-12 MED ORDER — ALPRAZOLAM 0.5 MG PO TABS
0.5000 mg | ORAL_TABLET | Freq: Once | ORAL | Status: AC
  Administered 2017-07-12: 0.5 mg via ORAL
  Filled 2017-07-12: qty 1

## 2017-07-12 MED ORDER — GI COCKTAIL ~~LOC~~
30.0000 mL | Freq: Once | ORAL | Status: AC
  Administered 2017-07-12: 30 mL via ORAL
  Filled 2017-07-12: qty 30

## 2017-07-12 MED ORDER — ONDANSETRON HCL 4 MG PO TABS: 4.0000 mg | ORAL_TABLET | Freq: Four times a day (QID) | ORAL | Status: DC | PRN

## 2017-07-12 MED ORDER — ONDANSETRON HCL 4 MG/2ML IJ SOLN: 4.0000 mg | INTRAMUSCULAR | Status: DC | PRN

## 2017-07-12 NOTE — Progress Notes (Signed)
Patient asked for Ambien. RN to explain it is too late to get Ambien. Plus there is no order.

## 2017-07-12 NOTE — Progress Notes (Signed)
Patient Demographics:    [name], is a 27 y.o. female, DOB - [birth year], KGM:[id number]  Admit date - 07/09/2017   Admitting Physician Vianne Bulls, MD  Outpatient Primary MD for the patient is Patient, No Pcp Per  LOS - 1   Chief Complaint  Patient presents with  . Hypotension  . Emesis  . Abdominal Pain        Subjective:    [name] today has no fevers, no emesis,  No chest pain, patient unhappy about lack of IV narcotics, refusing to eat, husband at bedside, RN Iffy at bedside, questions answered  Assessment  & Plan :    Principal Problem:   Intractable nausea and vomiting Active Problems:   Chronic abdominal pain   Insulin dependent diabetes mellitus (HCC)   Cellulitis and abscess of buttock   Essential hypertension   Normocytic anemia  Brief Summary:-  26 y.o. female with medical history significant for insulin-dependent diabetes (since age 78), chronic normocytic anemia, chronic abdominal pain, and gluteal abscess followed by surgery at [facility] admitted on 07/10/17 with intractable emesis and poor oral intake.     Plan:- 1) intractable emesis-suspect is related to gastroparesis, advance diet slowly, hydrate IV until oral intake is reliable, avoid IV narcotics and this may contribute to decreased GI motility and further nausea vomiting, Reglan as prescribed, as needed Zofran.    2)DM1-  allow some permissive Hyperglycemia rather than risk life-threatening hypoglycemia in a patient with unreliable oral intake. Use Novolog/Humalog Sliding scale insulin with Accu-Cheks/Fingersticks as ordered  3)Pelvic Abscess-to admission recently started on Keflex and Bactrim, currently on doxycycline, patient has an appointment with Dr [name]  on 6/44/0347 , buttock drains with scant drainage, continue doxycycline  4)Chronic Abd Pain-no evidence of acute abdomen, patient had a  soft bowel movement on 07/11/2017, patient is unhappy with my unwillingness to give IV narcotics, I explained the rationale for not prescribing IV narcotics, please see #1 above  5)Social--extensive conversation with patient and her husband at bedside about the need to avoid IV narcotics in the setting of intractable emesis presumed secondary to gastroparesis, patient unhappy with the decision to avoid IV narcotics, after multiple conversations with patient and the husband patient verbalized understanding of the rationale for avoiding IV narcotics in this particular clinical setting  Code Status : Full code  Disposition Plan  :  Home   DVT Prophylaxis  :  SCDs  Lab Results  Component Value Date   PLT 255 07/12/2017    Inpatient Medications  Scheduled Meds: . calcium carbonate  1 tablet Oral TID  . cholestyramine light  4 g Oral TID  . citalopram  20 mg Oral Daily  . feeding supplement (GLUCERNA SHAKE)  237 mL Oral TID BM  . folic acid  1 mg Oral Daily  . gabapentin  300 mg Oral BID  . insulin aspart  0-9 Units Subcutaneous Q4H  . insulin glargine  9 Units Subcutaneous QHS  . lipase/protease/amylase  36,000 Units Oral TID WC  . metoCLOPramide (REGLAN) injection  10 mg Intravenous Q8H  . multivitamin with minerals  1 tablet Oral Daily  . potassium chloride  40 mEq Oral Once  . potassium chloride  40 mEq Oral Once  .  sodium chloride flush  3 mL Intravenous Q12H  . sucralfate  1 g Oral TID WC & HS   Continuous Infusions: . doxycycline (VIBRAMYCIN) IV 100 mg (07/12/17 1713)  . famotidine (PEPCID) IV Stopped (07/12/17 0935)   PRN Meds:.acetaminophen **OR** acetaminophen, cyclobenzaprine, gi cocktail, hydrALAZINE, hyoscyamine, ondansetron **OR** ondansetron (ZOFRAN) IV, oxyCODONE    Anti-infectives (From admission, onward)   Start     Dose/Rate Route Frequency Ordered Stop   07/10/17 0600  doxycycline (VIBRAMYCIN) 100 mg in sodium chloride 0.9 % 250 mL IVPB     100 mg 125 mL/hr  over 120 Minutes Intravenous Every 12 hours 07/10/17 0441     07/10/17 0145  piperacillin-tazobactam (ZOSYN) IVPB 3.375 g     3.375 g 100 mL/hr over 30 Minutes Intravenous  Once 07/10/17 0142 07/10/17 0252        Objective:   Vitals:   07/11/17 2046 07/12/17 0000 07/12/17 0407 07/12/17 1426  BP: 139/76 131/87 (!) 147/91 (!) 151/98  Pulse: (!) 117 (!) 102 (!) 108 (!) 108  Resp: 14  16 18   Temp: 98 F (36.7 C)  97.8 F (36.6 C) 97.8 F (36.6 C)  TempSrc: Oral   Oral  SpO2: 99%  98% 100%  Weight:      Height:        Wt Readings from Last 3 Encounters:  07/10/17 59 kg (130 lb)  07/09/17 59 kg (130 lb)  06/27/17 59 kg (130 lb)     Intake/Output Summary (Last 24 hours) at 07/12/2017 1715 Last data filed at 07/12/2017 1500 Gross per 24 hour  Intake 3633.5 ml  Output -  Net 3633.5 ml     Physical Exam  Gen:- Awake Alert, in no acute distress HEENT:- [place].AT, No sclera icterus Neck-Supple Neck,No JVD,.  Lungs-  CTAB , good air movement CV- S1, S2 normal Abd-  +ve B.Sounds, Abd Soft, mild epigastric tenderness without rebound or guarding extremity/Skin:-Buttock area with drains with scant drainage  psych-affect is anxious about IV pain medications, oriented x3 Neuro-no new focal deficits, no tremors   Data Review:   Micro Results Recent Results (from the past 240 hour(s))  Blood Culture (routine x 2)     Status: None (Preliminary result)   Collection Time: 07/09/17 12:41 AM  Result Value Ref Range Status   Specimen Description   Final    BLOOD RIGHT FOREARM Performed at [facility], [place] [address] [place], [place] [address]    Special Requests   Final    BOTTLES DRAWN AEROBIC AND ANAEROBIC Blood Culture results may not be optimal due to an excessive volume of blood received in culture bottles Performed at [facility] [address], [place], [place] [address]    Culture   Final    NO GROWTH 3 DAYS Performed at [facility],  [place] [address] [place], [place] [address]    Report Status PENDING  Incomplete  Blood Culture (routine x 2)     Status: None (Preliminary result)   Collection Time: 07/09/17 12:41 AM  Result Value Ref Range Status   Specimen Description   Final    BLOOD LEFT ANTECUBITAL Performed at [facility] [address] [place], [place] [address]    Special Requests   Final    BOTTLES DRAWN AEROBIC ONLY Blood Culture results may not be optimal due to an inadequate volume of blood received in culture bottles Performed at [facility]  [address] [place], [place] [address]    Culture   Final    NO GROWTH 3 DAYS Performed at [facility], [place] [address] [place], [place] [address]    Report Status PENDING  Incomplete  Wound or Superficial Culture     Status: Abnormal   Collection Time: 07/09/17 12:41 AM  Result Value Ref Range Status   Specimen Description   Final    ABSCESS RIGHT BUTTOCK Performed at [facility] [address], [place], [place] [address]    Special Requests   Final    Normal Performed at [facility], [place] [address] [place], [place] [address]    Gram Stain   Final    FEW WBC PRESENT, PREDOMINANTLY PMN MODERATE GRAM POSITIVE RODS FEW GRAM POSITIVE COCCI RARE GRAM NEGATIVE RODS Performed at [facility], [place] [address] [place], [place] [address]    Culture MULTIPLE ORGANISMS PRESENT, NONE PREDOMINANT (A)  Final   Report Status 07/11/2017 FINAL  Final  Urine Culture     Status: None   Collection Time: 07/09/17 11:42 PM  Result Value Ref Range Status   Specimen Description   Final    URINE, CLEAN CATCH Performed at [facility], [place] [address], [place], [place] [address]    Special Requests   Final    NONE Performed at [facility], [place] [address], [place], [place] [address]    Culture   Final    NO GROWTH Performed at [facility], [place] [address], [place], [place] [address]    Report Status 07/11/2017 FINAL  Final  Culture, blood (Routine x 2)     Status: None (Preliminary result)   Collection Time: 07/10/17 12:33 AM  Result Value Ref Range Status   Specimen Description   Final    BLOOD LEFT FOREARM Performed at [facility] [address] [place], [place] [address]    Special Requests   Final    BOTTLES DRAWN AEROBIC AND ANAEROBIC Blood Culture adequate volume Performed at [facility] [address] [place], [place] [address]    Culture   Final    NO GROWTH 2 DAYS Performed at [facility] [address] [place], [place] [address]    Report Status PENDING  Incomplete  Culture, blood (Routine x 2)     Status: None (Preliminary result)   Collection Time: 07/10/17  7:41 AM  Result Value Ref Range Status   Specimen Description   Final    BLOOD RIGHT ANTECUBITAL Performed at [facility] [address], [place], [name] [address]    Special Requests   Final    BOTTLES DRAWN AEROBIC AND ANAEROBIC Blood Culture adequate volume Performed at [facility] [address], [place], [place] [address]    Culture   Final    NO GROWTH 2 DAYS Performed at [facility] [address] [place],  [address]    Report Status PENDING  Incomplete    Radiology Reports Dg Chest 2 View  Result Date: 07/10/2017 CLINICAL DATA:  Nausea, chest pain, shortness of breath. EXAM: CHEST - 2 VIEW COMPARISON:  Chest CT 05/27/2017 FINDINGS: The cardiomediastinal contours are normal. The lungs are clear. Pulmonary vasculature is normal. No consolidation, pleural effusion, or pneumothorax. No acute osseous abnormalities are seen. IMPRESSION: Normal radiographs of the chest. Electronically Signed   By: Fonnie Birkenhead.D.  On: 07/10/2017 00:26   Ct Pelvis W Contrast  Result Date: 07/09/2017 CLINICAL DATA:  Gluteal abscess.   Drain in place. EXAM: CT PELVIS WITH CONTRAST TECHNIQUE: Multidetector CT imaging of the pelvis was performed using the standard protocol following the bolus administration of intravenous contrast. CONTRAST:  57m ISOVUE-300 IOPAMIDOL (ISOVUE-300) INJECTION 61% COMPARISON:  Abdominopelvic CT 06/19/2017 FINDINGS: Urinary Tract: Distal ureters are decompressed. Urinary bladder is mildly distended. No bladder wall thickening. Bowel: Colonic wall thickening on prior exam has diminished in the interim, with persistent sigmoid and rectal wall thickening. No evidence of acute bowel inflammation. Vascular/Lymphatic: Few prominent inguinal nodes are likely reactive. No acute vascular findings. Reproductive:  Uterus and adnexa are unremarkable. Other: Right gluteal drainage catheter is unchanged in position. Unchanged appearance of the adjacent soft tissue density without dominant fluid collection. Soft tissue fullness tracks in the gluteal crease to the anal junction without significant no evidence of new fluid collection. No significant change from prior exam or progressive inflammatory change. No intrapelvic fluid collection or abscess. Musculoskeletal: There are no acute or suspicious osseous abnormalities. IMPRESSION: 1. Right gluteal drainage catheter in place, unchanged in appearance with associated soft tissue density in the subcutaneous tissues tracking to the anorectal region, suspicious for anocutaneous fistula. Overall no significant change from prior. No increased inflammation or new fluid collection. 2. Persistent but improved wall thickening of the rectosigmoid colon. Electronically Signed   By: MJeb LeveringM.D.   On: 07/09/2017 05:15   Ct Abdomen Pelvis W Contrast  Result Date: 06/19/2017 CLINICAL DATA:  Left mid abdominal pain and diarrhea 1 week. Crohn's disease. EXAM: CT ABDOMEN AND PELVIS WITH CONTRAST TECHNIQUE: Multidetector CT imaging of the abdomen and pelvis was performed using the standard  protocol following bolus administration of intravenous contrast. CONTRAST:  1058mISOVUE-300 IOPAMIDOL (ISOVUE-300) INJECTION 61% COMPARISON:  05/27/2017 and 01/15/2016 as well as 04/10/2017 FINDINGS: Lower chest: Lung bases demonstrate minimal opacification of the left lower lobe likely atelectasis. No effusion. Hepatobiliary: Previous cholecystectomy. Liver and biliary tree are normal. Pancreas: Normal. Spleen: Normal. Adrenals/Urinary Tract: Adrenal glands are normal. Kidneys are normal in size without hydronephrosis or nephrolithiasis. Ureters and bladder are unremarkable. Stomach/Bowel: Stomach is normal. Small bowel is within normal. Appendix is normal. There is minimal wall thickening throughout the colon likely due to mild colitis associated due to patient's Crohn's disease. No free fluid or free peritoneal air. Vascular/Lymphatic: Within normal. Reproductive: Within normal. 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 Musculoskeletal: Within normal. IMPRESSION: Mild wall thickening throughout the colon likely mild acute colitis due to patient's Crohn's disease. Changes over the subcutaneous region of the right gluteal region with surgical drain without significant change. 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 Minimal left base opacification likely atelectasis. Early infection is possible. Electronically Signed   By: DaMarin Olp.D.   On: 06/19/2017 14:30     CBC Recent Labs  Lab 07/09/17 0041 07/10/17 0033 07/12/17 1411  WBC 6.4 4.4 4.5  HGB 11.7* 10.6* 10.3*  HCT 35.4* 31.6* 29.6*  PLT 314 280 255  MCV 86.8 85.2 83.1  MCH 28.7 28.6 28.9  MCHC 33.1 33.5 34.8  RDW 13.1 13.0 12.8  LYMPHSABS 3.2  --   --   MONOABS 0.6  --   --   EOSABS 0.1  --   --   BASOSABS 0.0  --   --  Chemistries  Recent  Labs  Lab 07/09/17 0041 07/10/17 0033 07/10/17 0741 07/12/17 1411  NA 140 137 139 138  K 4.2 4.3 3.6 3.2*  CL 100* 101 105 101  CO2 30 25 24 22   GLUCOSE 93 204* 143* 155*  BUN 14 9 8 7   CREATININE 0.38* 0.54 0.48 0.51  CALCIUM 9.7 9.4 8.8* 8.9  AST 33 19  --   --   ALT 25 21  --   --   ALKPHOS 91 81  --   --   BILITOT 0.5 0.8  --   --    ------------------------------------------------------------------------------------------------------------------ No results for input(s): CHOL, HDL, LDLCALC, TRIG, CHOLHDL, LDLDIRECT in the last 72 hours.  Lab Results  Component Value Date   HGBA1C 5.7 (H) 04/10/2017   ------------------------------------------------------------------------------------------------------------------ No results for input(s): TSH, T4TOTAL, T3FREE, THYROIDAB in the last 72 hours.  Invalid input(s): FREET3 ------------------------------------------------------------------------------------------------------------------ No results for input(s): VITAMINB12, FOLATE, FERRITIN, TIBC, IRON, RETICCTPCT in the last 72 hours.  Coagulation profile Recent Labs  Lab 07/10/17 0033  INR 1.09    No results for input(s): DDIMER in the last 72 hours.  Cardiac Enzymes No results for input(s): CKMB, TROPONINI, MYOGLOBIN in the last 168 hours.  Invalid input(s): CK ------------------------------------------------------------------------------------------------------------------ No results found for: BNP   Roxan Hockey M.D on 07/12/2017 at 5:15 PM  Between 7am to 7pm - Pager - [phone]  After 7pm go to [email or web address] - password [email or web address]  [facility] -  Office  [phone]   Voice Recognition Viviann Spare dictation system was used to create this note, attempts have been made to correct errors. Please contact the author with questions and/or clarifications.

## 2017-07-12 NOTE — Progress Notes (Signed)
Patient was asleep when RN attempted to administer Xanax to the patient.

## 2017-07-12 NOTE — Progress Notes (Signed)
PCP was notified to see if it was appropriate that the patient be given Anti-anxiety medication. Awaiting any new orders.

## 2017-07-12 NOTE — Progress Notes (Signed)
RN offered patient a heating pack, patient refused.

## 2017-07-12 NOTE — Progress Notes (Signed)
Patient states pain is still 10/10.  RN offered to bring Oxycodone to the patient.

## 2017-07-12 NOTE — Progress Notes (Signed)
CMT reminded RN that the cardiac monitoring order had  Expired. PCP was notified

## 2017-07-13 LAB — GLUCOSE, CAPILLARY
Glucose-Capillary: 104 mg/dL — ABNORMAL HIGH (ref 65–99)
Glucose-Capillary: 148 mg/dL — ABNORMAL HIGH (ref 65–99)
Glucose-Capillary: 151 mg/dL — ABNORMAL HIGH (ref 65–99)
Glucose-Capillary: 193 mg/dL — ABNORMAL HIGH (ref 65–99)

## 2017-07-13 MED ORDER — BISMUTH SUBSALICYLATE 262 MG/15ML PO SUSP
30.0000 mL | Freq: Three times a day (TID) | ORAL | Status: DC | PRN
  Filled 2017-07-13: qty 236

## 2017-07-13 MED ORDER — ONDANSETRON HCL 4 MG PO TABS: 4.0000 mg | ORAL_TABLET | Freq: Four times a day (QID) | ORAL | 0 refills | Status: DC | PRN

## 2017-07-13 MED ORDER — PSYLLIUM 95 % PO PACK
1.0000 | PACK | Freq: Two times a day (BID) | ORAL | Status: DC
  Administered 2017-07-13: 1 via ORAL
  Filled 2017-07-13: qty 1

## 2017-07-13 MED ORDER — BOOST PLUS PO LIQD
237.0000 mL | Freq: Two times a day (BID) | ORAL | Status: DC
  Administered 2017-07-13: 237 mL via ORAL
  Filled 2017-07-13: qty 237

## 2017-07-13 MED ORDER — HYDROCODONE-ACETAMINOPHEN 5-325 MG PO TABS: 1.0000 | ORAL_TABLET | Freq: Four times a day (QID) | ORAL | 0 refills | Status: DC | PRN

## 2017-07-13 MED ORDER — POTASSIUM CHLORIDE CRYS ER 20 MEQ PO TBCR: 40.0000 meq | EXTENDED_RELEASE_TABLET | Freq: Every day | ORAL | 0 refills | Status: DC

## 2017-07-13 MED ORDER — ADULT MULTIVITAMIN W/MINERALS CH: 1.0000 | ORAL_TABLET | Freq: Every day | ORAL | 12 refills | Status: DC

## 2017-07-13 MED ORDER — FAMOTIDINE 20 MG PO TABS: 20.0000 mg | ORAL_TABLET | Freq: Two times a day (BID) | ORAL | Status: DC

## 2017-07-13 MED ORDER — CALCIUM CARBONATE ANTACID 500 MG PO CHEW: 1.0000 | CHEWABLE_TABLET | Freq: Three times a day (TID) | ORAL | 1 refills | Status: DC

## 2017-07-13 MED ORDER — GLUCOSE 4 G PO CHEW: 1.0000 | CHEWABLE_TABLET | ORAL | 12 refills | Status: DC | PRN

## 2017-07-13 MED ORDER — FLORANEX PO PACK: 1.0000 g | PACK | Freq: Three times a day (TID) | ORAL | 1 refills | Status: DC

## 2017-07-13 MED ORDER — FLORANEX PO PACK
1.0000 g | PACK | Freq: Three times a day (TID) | ORAL | Status: DC
  Administered 2017-07-13: 1 g via ORAL
  Filled 2017-07-13 (×2): qty 1

## 2017-07-13 MED ORDER — BISACODYL 10 MG RE SUPP: 10.0000 mg | Freq: Two times a day (BID) | RECTAL | Status: DC | PRN

## 2017-07-13 MED ORDER — POTASSIUM CHLORIDE CRYS ER 20 MEQ PO TBCR: 40.0000 meq | EXTENDED_RELEASE_TABLET | Freq: Once | ORAL | Status: DC

## 2017-07-13 MED ORDER — DOXYCYCLINE HYCLATE 100 MG PO TABS: 100.0000 mg | ORAL_TABLET | Freq: Two times a day (BID) | ORAL | Status: DC

## 2017-07-13 MED ORDER — CITALOPRAM HYDROBROMIDE 20 MG PO TABS: 20.0000 mg | ORAL_TABLET | Freq: Every day | ORAL | 1 refills | Status: DC

## 2017-07-13 MED ORDER — GI COCKTAIL ~~LOC~~: 30.0000 mL | Freq: Three times a day (TID) | ORAL | 1 refills | Status: DC | PRN

## 2017-07-13 MED ORDER — LANTUS 100 UNIT/ML ~~LOC~~ SOLN: 12.0000 [IU] | Freq: Every day | SUBCUTANEOUS | 2 refills | Status: DC

## 2017-07-13 NOTE — Progress Notes (Signed)
Nurse asked patient at this time how many times she had been to the bathroom, patient states she have been having diarrhea six to seven times since she eat lunch yesterday.  Encourage patient to notified staff next time she had diarrhea for stool assessment. Verbalize understanding.

## 2017-07-13 NOTE — Progress Notes (Signed)
Patient discharged home, discharge instructions/prescriptions given and explained to patient/husband and they verbalized understanding, denies any pain/distress. No wound intact, old surgical incisions without any sign of infection, clean/dry/intact.  Accompanied home by husband, transported to the car by staff.

## 2017-07-13 NOTE — Progress Notes (Signed)
Kathryn  [name]., [name], [name] [address] Phone: [phone]  FAX: [phone]      [name] [id number] [birth year]  CARE TEAM:  PCP: Patient, No Pcp Per  Outpatient Care Team: Patient Care Team: Patient, No Pcp Per as PCP - General (General Practice) Kathryn Ruff, MD as Consulting Physician (General Surgery) Kathryn Banister, MD as Attending Physician (Gastroenterology)  Inpatient Treatment Team: Treatment Team: Attending Provider: Roxan Hockey, MD; Rounding Team: Kathryn Baseman, MD; Technician: [name], NT; Technician: [name], NT; Technician: [name], NT; Registered Nurse: Kathryn Gobble, RN; Technician: [name], NT; Technician: [name], NT; Technician: [name], NT; Consulting Physician: Kathryn Ruff, MD   Problem List:   Principal Problem:   Intractable nausea and vomiting Active Problems:   Chronic abdominal pain   Insulin dependent diabetes mellitus ([place])   Cellulitis and abscess of buttock   Essential hypertension   Normocytic anemia      * No surgery found *     Assessment  Chronicity subcutaneous tracks and wounds with Penrose drains in place status post incision and drainage of complex gluteal and intergluteal cleft abscesses.  Type 1 diabetes with history of noncompliance and poor control finally getting under control.  Aggressive GI workup negative for Crohn's disease or any other inflammatory bowel disease.  Plan:  Because there is no active cellulitis and only mild tunneling at the Penrose is only, I went and removed all the Penroses and repacked with some 4 x 4 gauze tunneling in the left gluteal and left paragluteal wounds a couple centimeters.  I noted to the patient the importance of follow-up in our surgery office to make sure these things will finally heal.  She has no showed a few times, but has been more  compliant this past month.  Follow-up in our clinic later this week, Thursday, for a nurse only visit and wick exchange.  Remove and pack with 1/4" ribbon gauze. Probably do exchanges once or twice a week until this fully closes.  Hopefully this will finally close soon.  Follow-up with Dr. Marcello [name] in a few weeks to make sure things have close down finally.  Continue diabetic control.  Patient notes that she is established with Dr. Renato [name] with endocrinology.  While the hemoglobin A1c's have been elevated in the 9s, the last one was in the 6s.  I noted that the key thing to avoiding future infections is more aggressive diabetic control in this brittle diabetic 1 diabetic patient with history of noncompliance.  Otherwise the risk of recurrent infections is imminent.  She seems to establish with a better outpatient plan so [name], myself, & nursing or guardedly hopeful we can help break the cycle of infection  -VTE prophylaxis- SCDs, etc -mobilize as tolerated to help recovery  20 minutes spent in review, evaluation, examination, counseling, and coordination of care.  More than 50% of that time was spent in counseling.  [name], M.D., F.A.C.S. Gastrointestinal and Minimally Invasive Surgery [facility] [address] [place], [place] [address] [phone] Main / Paging   07/13/2017    Subjective: (Chief complaint)  Asked by Dr. Denton [name] for follow-up on this patient that was due to see my colleague in the office today.  27 year old type I diabetic with poor compliance and poor control with recurrent gluteal abscesses.  Required incision and drainage December 2018.  Recurrent abscess that  underwent repeat incision and drainage with penrose placement.  Then went to [name] with another incision drainage and another Penrose placed in January.  Has had the Penrose drains x 2 since then.  Has been stable off antibiotics with minimal drainage.   Just changing the dressing once a day.  Seen by Dr. Marcello [name] in our office a few weeks ago with the discussion of perhaps starting remove the Penrose drains if GI workup for Crohn's negative and diabetes / glucoses under better control  Admitted for gastroparesis and nausea vomiting.  That is under better control.  Internal medicine hoping to discharge her later today if she continues to improve.  Was due to see Dr. Marcello [name] in the office today consider removal of at least 1 of the Penroses.  Asked to see her since she was here.  Objective:  Vital signs:  Vitals:   07/12/17 0407 07/12/17 1426 07/12/17 2023 07/13/17 0447  BP: (!) 147/91 (!) 151/98 (!) 151/106 (!) 147/103  Pulse: (!) 108 (!) 108 (!) 112 (!) 101  Resp: 16 18 16 16   Temp: 97.8 F (36.6 C) 97.8 F (36.6 C) 99 F (37.2 C) 98.6 F (37 C)  TempSrc:  Oral Oral Oral  SpO2: 98% 100% 100% 100%  Weight:      Height:        Last BM Date: 07/11/17  Intake/Output   Yesterday:  04/14 0701 - 04/15 0700 In: 1601.8 [P.O.:120; I.V.:881.8; IV Piggyback:600] Out: -  This shift:  No intake/output data recorded.  Bowel function:  Flatus: YES  BM:  YES.  Loose     Physical Exam:  General: Pt awake/alert/oriented x4 in no acute distress.  Thin but not truly cachectic Eyes: PERRL, normal EOM.  Sclera clear.  No icterus Neuro: CN II-XII intact w/o focal sensory/motor deficits. Lymph: No head/neck/groin lymphadenopathy Psych:  No delerium/psychosis/paranoia HENT: Normocephalic, Mucus membranes moist.  No thrush Neck: Supple, No tracheal deviation Chest: No chest wall pain w good excursion CV:  Pulses intact.  Regular rhythm MS: Normal AROM mjr joints.  No obvious deformity Abdomen: Soft.  Nondistended.  Nontender.  No evidence of peritonitis.  No incarcerated hernias. Ext:  No deformity.  No mjr edema.  No cyanosis   Skin: No petechiae / purpura  Half-inch Penrose drains in place x2 in left buttock [name] to intergluteal cleft.   One [name] from left posterior midline gluteal region to medial gluteal region.  Another one [name] from the upper intergluteal cleft to the left paramedian gluteal region.  No cellulitis or abscess.  Minimal tunneling at this point.  Wound is just within the Penrose and not tracking along the gluteal or sacral fascia.  While some of it in the pilonidal region there are no pits or hairs to suspect pilonidal disease.  No perianal disease.  No new abscesses.  Results:   Labs: Results for orders placed or performed during the hospital encounter of 07/09/17 (from the past 48 hour(s))  Glucose, capillary     Status: None   Collection Time: 07/11/17 11:42 AM  Result Value Ref Range   Glucose-Capillary 82 65 - 99 mg/dL  Glucose, capillary     Status: Abnormal   Collection Time: 07/11/17  5:00 PM  Result Value Ref Range   Glucose-Capillary 59 (L) 65 - 99 mg/dL  Glucose, capillary     Status: Abnormal   Collection Time: 07/11/17  6:49 PM  Result Value Ref Range   Glucose-Capillary 103 (H) 65 - 99  mg/dL  Glucose, capillary     Status: Abnormal   Collection Time: 07/11/17  8:49 PM  Result Value Ref Range   Glucose-Capillary 165 (H) 65 - 99 mg/dL   Comment 1 Notify RN   Glucose, capillary     Status: Abnormal   Collection Time: 07/11/17 11:52 PM  Result Value Ref Range   Glucose-Capillary 116 (H) 65 - 99 mg/dL   Comment 1 Notify RN   Glucose, capillary     Status: None   Collection Time: 07/12/17  4:11 AM  Result Value Ref Range   Glucose-Capillary 93 65 - 99 mg/dL   Comment 1 Notify RN   Glucose, capillary     Status: Abnormal   Collection Time: 07/12/17  7:26 AM  Result Value Ref Range   Glucose-Capillary 113 (H) 65 - 99 mg/dL  Glucose, capillary     Status: None   Collection Time: 07/12/17 11:46 AM  Result Value Ref Range   Glucose-Capillary 95 65 - 99 mg/dL  CBC     Status: Abnormal   Collection Time: 07/12/17  2:11 PM  Result Value Ref Range   WBC 4.5 4.0 - 10.5 K/uL   RBC 3.56  (L) 3.87 - 5.11 MIL/uL   Hemoglobin 10.3 (L) 12.0 - 15.0 g/dL   HCT 29.6 (L) 36.0 - 46.0 %   MCV 83.1 78.0 - 100.0 fL   MCH 28.9 26.0 - 34.0 pg   MCHC 34.8 30.0 - 36.0 g/dL   RDW 12.8 11.5 - 15.5 %   Platelets 255 150 - 400 K/uL    Comment: Performed at [facility], [place] [address], [place], [place] [address]  Basic metabolic panel     Status: Abnormal   Collection Time: 07/12/17  2:11 PM  Result Value Ref Range   Sodium 138 135 - 145 mmol/L   Potassium 3.2 (L) 3.5 - 5.1 mmol/L   Chloride 101 101 - 111 mmol/L   CO2 22 22 - 32 mmol/L   Glucose, Bld 155 (H) 65 - 99 mg/dL   BUN 7 6 - 20 mg/dL   Creatinine, Ser 0.51 0.44 - 1.00 mg/dL   Calcium 8.9 8.9 - 10.3 mg/dL   GFR calc non Af Amer >60 >60 mL/min   GFR calc Af Amer >60 >60 mL/min    Comment: (NOTE) The eGFR has been calculated using the CKD EPI equation. This calculation has not been validated in all clinical situations. eGFR's persistently <60 mL/min signify possible Chronic Kidney Disease.    Anion gap 15 5 - 15    Comment: Performed at [name], [place] [address] [place], [place] [address]  Glucose, capillary     Status: Abnormal   Collection Time: 07/12/17  4:42 PM  Result Value Ref Range   Glucose-Capillary 301 (H) 65 - 99 mg/dL  Glucose, capillary     Status: Abnormal   Collection Time: 07/12/17  8:21 PM  Result Value Ref Range   Glucose-Capillary 265 (H) 65 - 99 mg/dL  Glucose, capillary     Status: Abnormal   Collection Time: 07/13/17 12:04 AM  Result Value Ref Range   Glucose-Capillary 104 (H) 65 - 99 mg/dL  Glucose, capillary     Status: Abnormal   Collection Time: 07/13/17  4:45 AM  Result Value Ref Range   Glucose-Capillary 193 (H) 65 - 99 mg/dL  Glucose, capillary     Status: Abnormal   Collection Time: 07/13/17  7:25 AM  Result Value  Ref Range   Glucose-Capillary 151 (H) 65 - 99 mg/dL    Imaging / Studies: No results found.  Medications / Allergies: per  chart  Antibiotics: Anti-infectives (From admission, onward)   Start     Dose/Rate Route Frequency Ordered Stop   07/10/17 0600  doxycycline (VIBRAMYCIN) 100 mg in sodium chloride 0.9 % 250 mL IVPB     100 mg 125 mL/hr over 120 Minutes Intravenous Every 12 hours 07/10/17 0441     07/10/17 0145  piperacillin-tazobactam (ZOSYN) IVPB 3.375 g     3.375 g 100 mL/hr over 30 Minutes Intravenous  Once 07/10/17 0142 07/10/17 0252        Note: Portions of this report may have been transcribed using voice recognition software. Every effort was made to ensure accuracy; however, inadvertent computerized transcription errors may be present.   Any transcriptional errors that result from this process are unintentional.     [name], M.D., F.A.C.S. Gastrointestinal and Minimally Invasive Surgery [facility] [address] [place], [place] [address] [phone] Main / Paging   07/13/2017

## 2017-07-13 NOTE — Discharge Instructions (Signed)
1) follow-up with surgical clinic on Thursday, 07/16/2017 for wound packing 2) follow-up with endocrinologist in 2 weeks for diabetic check 3) follow-up with Dr. [name] in 9-48 days for surgical wound check 4) it is important to achieve excellent diabetic control in order to promote healing of your wound  Nutrition [facility] Stay Proper nutrition can help your body recover from illness and injury.   Foods and beverages high in protein, vitamins, and minerals help rebuild muscle loss, promote healing, & reduce fall risk.   In addition to eating healthy foods, a nutrition shake is an easy, delicious way to get the nutrition you need during and after your hospital stay  It is recommended that you continue to drink 2 bottles per day of:       Glucerna Shake for at least 1 month (30 days) after your hospital stay   Tips for adding a nutrition shake into your routine: As allowed, drink one with vitamins or medications instead of water or juice Enjoy one as a tasty mid-morning or afternoon snack Drink cold or make a milkshake out of it Drink one instead of milk with cereal or snacks Use as a coffee creamer   Available at the following grocery stores and pharmacies:           * [facility] [phone]            For COUPONS visit: www.ensure.com/join or http://dawson-may.com/   Suggested Substitutions Ensure Plus = Boost Plus = Carnation Breakfast Essentials = Boost Compact Ensure Active Clear = Boost Breeze Glucerna Shake = Boost Glucose Control = Carnation Breakfast Essentials SUGAR FREE  WOUND CARE  It is important that the wound be kept open.   -Keeping the skin edges apart will allow the wound to gradually heal from the base upwards.   - If the skin edges of the wound close too early, a new fluid pocket  can form and infection can occur. -This is the reason to pack deeper wounds with gauze or ribbon -This is why drained wounds cannot be sewed closed right away  A healthy wound should form a lining of bright red "beefy" granulating tissue that will help shrink the wound and help the edges grow new skin into it.   -A little mucus / yellow discharge is normal (the body's natural way to try and form a scab) and should be gently washed off with soap and water with daily dressing changes.  -Green or foul smelling drainage implies bacterial colonization and can slow wound healing - a short course of antibiotic ointment (3-5 days) can help it clear up.  Call the doctor if it does not improve or worsens  -Avoid use of antibiotic ointments for more than a week as they can slow wound healing over time.    -Sometimes other wound care products will be used to reduce need for dressing changes and/or help clean up dirty wounds -Sometimes the surgeon needs to debride the wound in the office to remove dead or infected tissue out of the wound so it can heal more quickly and safely.    Change the dressing at least once a day -Wash the wound with mild soap and  water gently every day.  It is good to shower or bathe the wound to help it clean out. -Use clean 4x4 gauze for medium/large wounds or ribbon plain NU-gauze for smaller wounds (it does not need to be sterile, just clean) -Keep the raw wound moist with a little saline or KY (saline) gel on the gauze.  -A dry wound will take longer to heal.  -Keep the skin dry around the wound to prevent breakdown and irritation. -Pack the wound down to the base -The goal is to keep the skin apart, not overpack the wound -Use a Q-tip or blunt-tipped kabob stick toothpick to push the gauze down to the base in narrow or deep wounds   -Cover with a clean gauze and tape -paper or Medipore tape tend to be gentle on the skin -rotate the orientation of the tape to avoid repeated  stress/trauma on the skin -using an ACE or Coban wrap on wounds on arms or legs can be used instead.  Complete all antibiotics through the entire prescription to help the infection heal and prevent new places of infection   Returning the see the surgeon is helpful to follow the healing process and help the wound close as fast as possible.      1) follow-up with surgical clinic on Thursday, 07/16/2017 for wound packing 2) follow-up with endocrinologist in 2 weeks for diabetic check 3) follow-up with Dr. [name] in 1-02 days for surgical wound check 4) it is important to achieve excellent diabetic control in order to promote healing of your wound

## 2017-07-13 NOTE — Discharge Summary (Signed)
[name], is a 27 y.o. female  DOB 1991-03-11  MRN [id number].  Admission date:  07/09/2017  Admitting Physician  Vianne Bulls, MD  Discharge Date:  07/13/2017   Primary MD  Renato Shin, MD  Recommendations for primary care physician for things to follow:   1) follow-up with surgical clinic on Thursday, 07/16/2017 for wound packing 2) follow-up with endocrinologist in 2 weeks for diabetic check 3) follow-up with Dr. 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 Anxiety   . Diabetes type 1, uncontrolled ([place]) 11/14/2011   Since age 65  . Fibromyalgia   . Gastroparesis   . Hypertension   . Infection    UTI April 2016    Past Surgical History:  Procedure Laterality Date  . ANKLE SURGERY    . CHOLECYSTECTOMY  11/15/2011   Procedure: LAPAROSCOPIC CHOLECYSTECTOMY WITH INTRAOPERATIVE CHOLANGIOGRAM;  Surgeon: [name], MD;  Location: [facility];  Service: General;  Laterality: N/A;  . COLONOSCOPY WITH PROPOFOL N/A 06/27/2017   Procedure: COLONOSCOPY WITH PROPOFOL;  Surgeon: Milus Banister, MD;  [facility];  Service: Endoscopy;  Laterality: N/A;  . ESOPHAGOGASTRODUODENOSCOPY  12/03/2011   Procedure: ESOPHAGOGASTRODUODENOSCOPY (EGD);  Surgeon: Beryle Beams, MD;  [facility]: [facility];  Service: Endoscopy;  Laterality: N/A;  . FLEXIBLE SIGMOIDOSCOPY N/A  03/10/2017   Procedure: FLEXIBLE SIGMOIDOSCOPY;  Surgeon: Carol Ada, MD;  [facility];  Service: Endoscopy;  Laterality: N/A;  . INCISION AND DRAINAGE PERIRECTAL ABSCESS N/A 03/01/2017   Procedure: IRRIGATION AND DEBRIDEMENT PERIRECTAL ABSCESS;  Surgeon: Alphonsa Overall, MD;  [facility];  Service: General;  Laterality: N/A;  . IRRIGATION AND DEBRIDEMENT BUTTOCKS N/A 03/23/2017   Procedure: IRRIGATION AND DEBRIDEMENT BUTTOCKS, SETON PLACEMENT;  Surgeon: [name], MD;  [facility];  Service: General;  Laterality: N/A;  . LAPAROSCOPY  11/23/2011   Procedure: LAPAROSCOPY DIAGNOSTIC;  Surgeon: Edward Jolly, MD;  [facility];  Service: General;  Laterality: N/A;       HPI  from the history and physical done on the day of admission:     HPI: [name] is a 27 y.o. female with medical history significant for insulin-dependent diabetes, chronic normocytic anemia, chronic abdominal pain, and gluteal abscess followed by surgery at [facility], now presenting to the emergency department for evaluation of abdominal pain, nausea, and nonbloody vomiting.  Patient reports that her abdominal pain developed insidiously over the past 3 or 4 days and she began  vomiting approximately 24 hours ago.  She was seen in the emergency department yesterday for the abdominal pain but was not vomiting at that time.  She had a reassuring workup, but there was concern for cellulitis surrounding her gluteal abscess that has a drain in place, and she was sent home with oral antibiotics.  She began vomiting shortly after returning home and has been unable to keep her medications down.  She describes her abdominal pain as severe, generalized, similar to her prior abdominal pain.  No fevers or chills, no chest pain, no cough or dyspnea.  ED Course: Upon arrival to the ED, patient is found to be afebrile, saturating well on room air, tachycardic to 120, and initial blood pressure low.  EKG  features a sinus tachycardia with rate 108 and chest x-ray is a normal study.  Chemistry panel is notable for glucose 204 and CBC features a chronic stable normocytic anemia with hemoglobin of 10.6.  Lactic acid is reassuringly normal and urinalysis is unremarkable.  Blood and urine cultures were collected, 3 L of normal saline administered, and the patient was treated with empiric vancomycin and Zosyn given the low blood pressure on arrival.  She became hypertensive after the fluid, has remained mildly tachycardic but stable, and will be observed on the telemetry unit for ongoing evaluation and management of intractable nausea with nonbloody vomiting suspected secondary to gastroparesis.      Hospital Course:     Brief Summary:-  27 y.o.femalewith medical history significant forinsulin-dependent diabetes (since age 16), chronic normocytic anemia, chronic abdominal pain, and gluteal abscess followed by surgery at [facility] admitted on 07/10/17 with intractable emesis and poor oral intake.  Previous GI workup, endoluminal evaluation and biopsies negative for Crohn's Colitis /IBD     Plan:- 1) intractable emesis-suspect is related to gastroparesis, soft, patient is tolerating oral intake well, no further vomiting , advised to avoid excessive opiate use, as this may contribute to decreased GI motility and further nausea /vomiting  2)DM1-recently better control overall, prior to admission patient was on 18 units of Lantus insulin, during the hospital stay here she was on 9 units, we discharged home on 12 units daily, this can be titrated upwards as oral intake improves  3)Pelvic Abscess- prior to admission recently started on Keflex and Bactrim, during this hospital stay she was treated with doxycycline, d/w Dr. Alwyn Pea from general surgery on 07/13/2017, Dr. Johney Maine removed the Penrose drains on patient's right buttock, as per Dr. Johney Maine no further antibiotics, follow-up with with Dr  [name]   in 1-2 weeks.... Previous GI workup, endoluminal evaluation and biopsies negative for Crohn's Colitis /IBD  4)Chronic Abd Pain-no evidence of acute abdomen, improved abdominal pain, having bowel movements, okay to use antiemetics, Carafate and PPI, Previous GI workup, endoluminal evaluation and biopsies negative for Crohn's Colitis /IBD  5)Social--extensive conversation with patient and her husband at bedside about the need to avoid excessive narcotics in the setting of intractable emesis presumed secondary to gastroparesis, patient unhappy with the decision to avoid  narcotics, after multiple conversations with patient and the husband patient verbalized understanding of the rationale for avoiding excessive narcotics in this particular clinical setting  Code Status : Full code  Disposition Plan  :  Home   DVT Prophylaxis  :  SCDs   Consult-Dr. Annie Main gross general surgery  Discharge Condition: stable  Follow UP  Follow-up Information    [name], MD. Schedule an appointment as soon as possible for a  visit in 2 week(s).   Specialty:  General Surgery Why:  to check your wounds & make sure that they hace closed Contact information: [address] [place] [address] [phone]        [facility], [place] .   Specialty:  General Surgery Contact information: [place] [phone]           Diet and Activity recommendation:  As advised  Discharge Instructions  * Discharge Instructions    Call MD for:  difficulty breathing, headache or visual disturbances   Complete by:  As directed    Call MD for:  persistant dizziness or light-headedness   Complete by:  As directed    Call MD for:  persistant nausea and vomiting   Complete by:  As directed    Call MD for:  redness, tenderness, or signs of infection (pain, swelling, redness, odor or green/yellow discharge around  incision site)   Complete by:  As directed    Call MD for:  severe uncontrolled pain   Complete by:  As directed    Call MD for:  temperature >100.4   Complete by:  As directed    Diet - low sodium heart healthy   Complete by:  As directed    Discharge instructions   Complete by:  As directed    1) follow-up with surgical clinic on Thursday, 07/16/2017 for wound packing 2) follow-up with endocrinologist in 2 weeks for diabetic check 3) follow-up with Dr. [name] in 5-09 days for surgical wound check 4) it is important to achieve excellent diabetic control in order to promote healing of your wound   Increase activity slowly   Complete by:  As directed         Discharge Medications     Allergies as of 07/13/2017      Reactions   Other Anaphylaxis   Reaction to [place] nuts   Lactose Intolerance (gi) Diarrhea      Medication List    STOP taking these medications   cephALEXin 500 MG capsule Commonly known as:  KEFLEX   EPINEPHrine 0.3 mg/0.3 mL Soaj injection Commonly known as:  EPIPEN 2-PAK   folic acid 1 MG tablet Commonly known as:  FOLVITE   loperamide 2 MG capsule Commonly known as:  IMODIUM   sulfamethoxazole-trimethoprim 800-160 MG tablet Commonly known as:  BACTRIM DS,SEPTRA DS     TAKE these medications   acetaminophen 500 MG tablet Commonly known as:  TYLENOL Take 1,000 mg by mouth daily as needed for moderate pain.   calcium carbonate 500 MG chewable tablet Commonly known as:  TUMS - dosed in mg elemental calcium Chew 1 tablet (200 mg of elemental calcium total) by mouth 3 (three) times daily.   cholestyramine light 4 g packet Commonly known as:  PREVALITE Take 1 packet (4 g total) by mouth 3 (three) times daily. What changed:    when to take this  reasons to take this   citalopram 20 MG tablet Commonly known as:  CELEXA Take 1 tablet (20 mg total) by mouth daily.   cyclobenzaprine 10 MG tablet Commonly known as:  FLEXERIL Take 1  tablet (10 mg total) by mouth 2 (two) times daily as needed for muscle spasms.   famotidine 40 MG tablet Commonly known as:  PEPCID Take 1 tablet (40 mg total) by mouth 2 (two) times daily.   ferrous sulfate 325 (65 FE) MG  tablet Take 1 tablet (325 mg total) by mouth 2 (two) times daily with a meal.   furosemide 40 MG tablet Commonly known as:  LASIX Take 40 mg by mouth daily as needed for fluid.   gabapentin 300 MG capsule Commonly known as:  NEURONTIN Take 1 capsule (300 mg total) by mouth 2 (two) times daily.   gi cocktail Susp suspension Take 30 mLs by mouth 3 (three) times daily as needed for indigestion. Shake well.   glucose 4 GM chewable tablet Chew 1 tablet (4 g total) by mouth as needed for low blood sugar. What changed:  when to take this   HYDROcodone-acetaminophen 5-325 MG tablet Commonly known as:  NORCO/VICODIN Take 1 tablet by mouth every 6 (six) hours as needed for moderate pain or severe pain. What changed:  how much to take   insulin lispro 100 UNIT/ML injection Commonly known as:  HUMALOG Inject 0.02 mLs (2 Units total) into the skin 3 (three) times daily with meals. What changed:    how much to take  additional instructions   lactobacillus Pack Take 1 packet (1 g total) by mouth 3 (three) times daily with meals.   LANTUS 100 UNIT/ML injection Generic drug:  insulin glargine Inject 0.12 mLs (12 Units total) into the skin daily. What changed:    how much to take  when to take this   lidocaine 2 % solution Commonly known as:  XYLOCAINE Use as directed 15 mLs in the mouth or throat every 6 (six) hours as needed (stomach pain).   lidocaine 5 % Commonly known as:  LIDODERM Place 1 patch onto the skin daily. Remove & Discard patch within 12 hours or as directed by MD What changed:    when to take this  reasons to take this  additional instructions   lipase/protease/amylase 36000 UNITS Cpep capsule Commonly known as:  CREON Take 1  capsule (36,000 Units total) by mouth 3 (three) times daily with meals.   metoCLOPramide 5 MG tablet Commonly known as:  REGLAN Take 5 mg by mouth every 6 (six) hours as needed for nausea or vomiting.   multivitamin with minerals Tabs tablet Take 1 tablet by mouth daily. Start taking on:  07/14/2017   ondansetron 4 MG disintegrating tablet Commonly known as:  ZOFRAN ODT Take 1 tablet (4 mg total) by mouth every 8 (eight) hours as needed for nausea or vomiting.   ondansetron 4 MG tablet Commonly known as:  ZOFRAN Take 1 tablet (4 mg total) by mouth every 6 (six) hours as needed for nausea.   potassium chloride SA 20 MEQ tablet Commonly known as:  K-DUR,KLOR-CON Take 2 tablets (40 mEq total) by mouth daily for 3 days.   sucralfate 1 GM/10ML suspension Commonly known as:  CARAFATE Take 10 mLs (1 g total) by mouth 4 (four) times daily -  with meals and at bedtime.       Major procedures and Radiology Reports - PLEASE review detailed and final reports for all details, in brief -    Dg Chest 2 View  Result Date: 07/10/2017 CLINICAL DATA:  Nausea, chest pain, shortness of breath. EXAM: CHEST - 2 VIEW COMPARISON:  Chest CT 05/27/2017 FINDINGS: The cardiomediastinal contours are normal. The lungs are clear. Pulmonary vasculature is normal. No consolidation, pleural effusion, or pneumothorax. No acute osseous abnormalities are seen. IMPRESSION: Normal radiographs of the chest. Electronically Signed   By: Jeb Levering M.D.   On: 07/10/2017 00:26   Ct Pelvis W  Contrast  Result Date: 07/09/2017 CLINICAL DATA:  Gluteal abscess.  Drain in place. EXAM: CT PELVIS WITH CONTRAST TECHNIQUE: Multidetector CT imaging of the pelvis was performed using the standard protocol following the bolus administration of intravenous contrast. CONTRAST:  62m ISOVUE-300 IOPAMIDOL (ISOVUE-300) INJECTION 61% COMPARISON:  Abdominopelvic CT 06/19/2017 FINDINGS: Urinary Tract: Distal ureters are decompressed.  Urinary bladder is mildly distended. No bladder wall thickening. Bowel: Colonic wall thickening on prior exam has diminished in the interim, with persistent sigmoid and rectal wall thickening. No evidence of acute bowel inflammation. Vascular/Lymphatic: Few prominent inguinal nodes are likely reactive. No acute vascular findings. Reproductive:  Uterus and adnexa are unremarkable. Other: Right gluteal drainage catheter is unchanged in position. Unchanged appearance of the adjacent soft tissue density without dominant fluid collection. Soft tissue fullness tracks in the gluteal crease to the anal junction without significant no evidence of new fluid collection. No significant change from prior exam or progressive inflammatory change. No intrapelvic fluid collection or abscess. Musculoskeletal: There are no acute or suspicious osseous abnormalities. IMPRESSION: 1. Right gluteal drainage catheter in place, unchanged in appearance with associated soft tissue density in the subcutaneous tissues tracking to the anorectal region, suspicious for anocutaneous fistula. Overall no significant change from prior. No increased inflammation or new fluid collection. 2. Persistent but improved wall thickening of the rectosigmoid colon. Electronically Signed   By: MJeb LeveringM.D.   On: 07/09/2017 05:15   Ct Abdomen Pelvis W Contrast  Result Date: 06/19/2017 CLINICAL DATA:  Left mid abdominal pain and diarrhea 1 week. Crohn's disease. EXAM: CT ABDOMEN AND PELVIS WITH CONTRAST TECHNIQUE: Multidetector CT imaging of the abdomen and pelvis was performed using the standard protocol following bolus administration of intravenous contrast. CONTRAST:  1035mISOVUE-300 IOPAMIDOL (ISOVUE-300) INJECTION 61% COMPARISON:  05/27/2017 and 01/15/2016 as well as 04/10/2017 FINDINGS: Lower chest: Lung bases demonstrate minimal opacification of the left lower lobe likely atelectasis. No effusion. Hepatobiliary: Previous cholecystectomy. Liver  and biliary tree are normal. Pancreas: Normal. Spleen: Normal. Adrenals/Urinary Tract: Adrenal glands are normal. Kidneys are normal in size without hydronephrosis or nephrolithiasis. Ureters and bladder are unremarkable. Stomach/Bowel: Stomach is normal. Small bowel is within normal. Appendix is normal. There is minimal wall thickening throughout the colon likely due to mild colitis associated due to patient's Crohn's disease. No free fluid or free peritoneal air. Vascular/Lymphatic: Within normal. Reproductive: Within normal. 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 Musculoskeletal: Within normal. IMPRESSION: Mild wall thickening throughout the colon likely mild acute colitis due to patient's Crohn's disease. Changes over the subcutaneous region of the right gluteal region with surgical drain without significant change. 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 Minimal left base opacification likely atelectasis. Early infection is possible. Electronically Signed   By: DaMarin Olp.D.   On: 06/19/2017 14:30    Micro Results    Recent Results (from the past 240 hour(s))  Blood Culture (routine x 2)     Status: None (Preliminary result)   Collection Time: 07/09/17 12:41 AM  Result Value Ref Range Status   Specimen Description   Final    BLOOD RIGHT FOREARM Performed at [facility] [place] [id number]  Special Requests   Final    BOTTLES DRAWN AEROBIC AND ANAEROBIC Blood Culture results may not be optimal due to an  excessive volume of blood received in culture bottles Performed at [place] [address], [place], [place] [address]    Culture   Final    NO GROWTH 4 DAYS Performed at [facility], [place] [address] [place], [place] [address]     Report Status PENDING  Incomplete  Blood Culture (routine x 2)     Status: None (Preliminary result)   Collection Time: 07/09/17 12:41 AM  Result Value Ref Range Status   Specimen Description   Final    BLOOD LEFT ANTECUBITAL Performed at [place] [address], [place], [place] [address]    Special Requests   Final    BOTTLES DRAWN AEROBIC ONLY Blood Culture results may not be optimal due to an inadequate volume of blood received in culture bottles Performed at [place] [address] [place], [place] [address]    Culture   Final    NO GROWTH 4 DAYS Performed at [facility], [place] [address], [place], [place] [address]    Report Status PENDING  Incomplete  Wound or Superficial Culture     Status: Abnormal   Collection Time: 07/09/17 12:41 AM  Result Value Ref Range Status   Specimen Description   Final    ABSCESS RIGHT BUTTOCK Performed at [facility] [address] [place], [place] [address]    Special Requests   Final    Normal Performed at [facility], [place] [address], [place], [place] [address]    Gram Stain   Final    FEW WBC PRESENT, PREDOMINANTLY PMN MODERATE GRAM POSITIVE RODS FEW GRAM POSITIVE COCCI RARE GRAM NEGATIVE RODS Performed at [facility], [place] [address] [place], [place] [address]    Culture MULTIPLE ORGANISMS PRESENT, NONE PREDOMINANT (A)  Final   Report Status 07/11/2017 FINAL  Final  Urine Culture     Status: None   Collection Time: 07/09/17 11:42 PM  Result Value Ref Range Status   Specimen Description   Final    URINE, CLEAN CATCH Performed at [facility], [place] [address], [place], [place] [address]    Special Requests   Final    NONE Performed at [facility], [place] [address] [place], [place] [address]    Culture   Final    NO GROWTH Performed at [facility], [place] [address] [place], [place]  [address]    Report Status 07/11/2017 FINAL  Final  Culture, blood (Routine x 2)     Status: None (Preliminary result)   Collection Time: 07/10/17 12:33 AM  Result Value Ref Range Status   Specimen Description   Final    BLOOD LEFT FOREARM Performed at [place] [address] [place], [place] [address]    Special Requests   Final    BOTTLES DRAWN AEROBIC AND ANAEROBIC Blood Culture adequate volume Performed at [place] [address] [place], [place] [address]    Culture   Final    NO GROWTH 3 DAYS Performed at [facility], [place] [address] [place], [place] [address]    Report Status PENDING  Incomplete  Culture, blood (Routine x 2)     Status: None (Preliminary result)   Collection Time: 07/10/17  7:41 AM  Result Value Ref Range Status   Specimen Description   Final    BLOOD RIGHT ANTECUBITAL Performed at [facility]  [place] [address] [place], [place] [address]    Special Requests   Final    BOTTLES DRAWN AEROBIC AND ANAEROBIC Blood Culture adequate volume Performed at [place] [address], [place], [place] [address]    Culture   Final    NO GROWTH 3 DAYS Performed at [facility], [place] [address] [place], [place] [address]    Report Status PENDING  Incomplete       Today   Subjective    Tonia Avino today has no new complaints, No fever  Or chills, No nausea, vomiting, diarrhea           Patient has been seen and examined prior to discharge   Objective   Blood pressure (!) 145/106, pulse (!) 107, temperature 97.7 F (36.5 C), temperature source Oral, resp. rate 18, height 5' 7"  (1.702 m), weight 59 kg (130 lb), SpO2 100 %, currently breastfeeding.   Intake/Output Summary (Last 24 hours) at 07/13/2017 1552 Last data filed at 07/13/2017 1346 Gross per 24 hour  Intake 790 ml  Output -  Net 790 ml    Exam Gen:- Awake Alert,  In no apparent distress    HEENT:- [place].AT, No sclera icterus Neck-Supple Neck,No JVD,.  Lungs-  CTAB , good air movement CV- S1, S2 normal Abd-  +ve B.Sounds, Abd Soft, No tenderness,    Extremity/Skin:- No  edema,   good pulses, Penrose drains on right buttock removed, wound repacked with dry gauze by Dr. Johney Maine Psych-affect is appropriate, oriented x3 Neuro-no new focal deficits, no tremors   Data Review   CBC w Diff:  Lab Results  Component Value Date   WBC 4.5 07/12/2017   HGB 10.3 (L) 07/12/2017   HCT 29.6 (L) 07/12/2017   PLT 255 07/12/2017   LYMPHOPCT 49 07/09/2017   MONOPCT 9 07/09/2017   EOSPCT 2 07/09/2017   BASOPCT 1 07/09/2017    CMP:  Lab Results  Component Value Date   NA 138 07/12/2017   K 3.2 (L) 07/12/2017   CL 101 07/12/2017   CO2 22 07/12/2017   BUN 7 07/12/2017   CREATININE 0.51 07/12/2017   CREATININE 0.48 (L) 11/07/2014   PROT 8.1 07/10/2017   ALBUMIN 3.5 07/10/2017   BILITOT 0.8 07/10/2017   ALKPHOS 81 07/10/2017   AST 19 07/10/2017   ALT 21 07/10/2017  .   Total Discharge time is about 33 minutes  Roxan Hockey M.D on 07/13/2017 at 3:52 PM  [facility]   Office  [phone]  Voice Recognition Viviann Spare dictation system was used to create this note, attempts have been made to correct errors. Please contact the author with questions and/or clarifications.

## 2017-07-13 NOTE — Progress Notes (Signed)
Pharmacy: IV to PO  IV doxycyline and IV pepcid transitioned to PO per protocol  Herby Abraham, Pharm.D. [phone] 07/13/2017 12:50 PM

## 2017-07-14 LAB — CULTURE, BLOOD (ROUTINE X 2)
Culture: NO GROWTH
Culture: NO GROWTH

## 2017-07-15 LAB — CULTURE, BLOOD (ROUTINE X 2)
Culture: NO GROWTH
Culture: NO GROWTH
Special Requests: ADEQUATE
Special Requests: ADEQUATE

## 2017-07-23 ENCOUNTER — Emergency Department (HOSPITAL_COMMUNITY): Payer: 59

## 2017-07-23 ENCOUNTER — Other Ambulatory Visit: Payer: Self-pay

## 2017-07-23 ENCOUNTER — Encounter (HOSPITAL_COMMUNITY): Payer: Self-pay

## 2017-07-23 ENCOUNTER — Emergency Department (HOSPITAL_COMMUNITY)
Admission: EM | Admit: 2017-07-23 | Discharge: 2017-07-23 | Disposition: A | Discharge status: Home / Self Care | Payer: 59 | Attending: Emergency Medicine | Admitting: Emergency Medicine

## 2017-07-23 DIAGNOSIS — E1065 Type 1 diabetes mellitus with hyperglycemia: Secondary | ICD-10-CM | POA: Diagnosis not present

## 2017-07-23 DIAGNOSIS — R112 Nausea with vomiting, unspecified: Secondary | ICD-10-CM | POA: Diagnosis not present

## 2017-07-23 DIAGNOSIS — R0602 Shortness of breath: Secondary | ICD-10-CM | POA: Diagnosis not present

## 2017-07-23 DIAGNOSIS — Z79899 Other long term (current) drug therapy: Secondary | ICD-10-CM | POA: Insufficient documentation

## 2017-07-23 DIAGNOSIS — R739 Hyperglycemia, unspecified: Secondary | ICD-10-CM

## 2017-07-23 DIAGNOSIS — R0789 Other chest pain: Secondary | ICD-10-CM | POA: Diagnosis not present

## 2017-07-23 DIAGNOSIS — I10 Essential (primary) hypertension: Secondary | ICD-10-CM | POA: Insufficient documentation

## 2017-07-23 DIAGNOSIS — M545 Low back pain: Secondary | ICD-10-CM | POA: Diagnosis not present

## 2017-07-23 DIAGNOSIS — M7918 Myalgia, other site: Secondary | ICD-10-CM | POA: Diagnosis not present

## 2017-07-23 DIAGNOSIS — R079 Chest pain, unspecified: Secondary | ICD-10-CM | POA: Diagnosis not present

## 2017-07-23 DIAGNOSIS — R197 Diarrhea, unspecified: Secondary | ICD-10-CM | POA: Insufficient documentation

## 2017-07-23 LAB — URINALYSIS, ROUTINE W REFLEX MICROSCOPIC
Bacteria, UA: NONE SEEN
Bilirubin Urine: NEGATIVE
Glucose, UA: >=500 mg/dL — AB
Ketones, ur: 5 mg/dL — AB
Leukocytes, UA: NEGATIVE
Nitrite: NEGATIVE
Protein, ur: NEGATIVE mg/dL
Specific Gravity, Urine: 1.027 (ref 1.005–1.030)
pH: 7 (ref 5.0–8.0)

## 2017-07-23 LAB — I-STAT CHEM 8, ED
BUN: 19 mg/dL (ref 6–20)
Calcium, Ion: 1.17 mmol/L (ref 1.15–1.40)
Chloride: 98 mmol/L — ABNORMAL LOW (ref 101–111)
Creatinine, Ser: 0.5 mg/dL (ref 0.44–1.00)
Glucose, Bld: 342 mg/dL — ABNORMAL HIGH (ref 65–99)
HCT: 28 % — ABNORMAL LOW (ref 36.0–46.0)
Hemoglobin: 9.5 g/dL — ABNORMAL LOW (ref 12.0–15.0)
Potassium: 4.1 mmol/L (ref 3.5–5.1)
Sodium: 136 mmol/L (ref 135–145)
TCO2: 27 mmol/L (ref 22–32)

## 2017-07-23 LAB — CBC
HCT: 38.8 % (ref 36.0–46.0)
Hemoglobin: 13.2 g/dL (ref 12.0–15.0)
MCH: 29.6 pg (ref 26.0–34.0)
MCHC: 34 g/dL (ref 30.0–36.0)
MCV: 87 fL (ref 78.0–100.0)
Platelets: 263 10*3/uL (ref 150–400)
RBC: 4.46 MIL/uL (ref 3.87–5.11)
RDW: 12.5 % (ref 11.5–15.5)
WBC: 4.3 10*3/uL (ref 4.0–10.5)

## 2017-07-23 LAB — BASIC METABOLIC PANEL
Anion gap: 14 (ref 5–15)
BUN: 25 mg/dL — ABNORMAL HIGH (ref 6–20)
CO2: 28 mmol/L (ref 22–32)
Calcium: 10.4 mg/dL — ABNORMAL HIGH (ref 8.9–10.3)
Chloride: 88 mmol/L — ABNORMAL LOW (ref 101–111)
Creatinine, Ser: 0.79 mg/dL (ref 0.44–1.00)
GFR calc Af Amer: >60 mL/min (ref >60)
GFR calc non Af Amer: >60 mL/min (ref >60)
Glucose, Bld: 653 mg/dL (ref 65–99)
Potassium: 5.2 mmol/L — ABNORMAL HIGH (ref 3.5–5.1)
Sodium: 130 mmol/L — ABNORMAL LOW (ref 135–145)

## 2017-07-23 LAB — CBG MONITORING, ED
Glucose-Capillary: 565 mg/dL (ref 65–99)
Glucose-Capillary: 546 mg/dL (ref 65–99)
Glucose-Capillary: 387 mg/dL — ABNORMAL HIGH (ref 65–99)

## 2017-07-23 LAB — I-STAT TROPONIN, ED: Troponin i, poc: 0.01 ng/mL (ref 0.00–0.08)

## 2017-07-23 LAB — I-STAT BETA HCG BLOOD, ED (MC, WL, AP ONLY): I-stat hCG, quantitative: <5 m[IU]/mL (ref <5)

## 2017-07-23 IMAGING — CR DG CHEST 2V
2 series · 2 of 2 positions shown · non-contrast
Comparison: Chest radiograph [DATE]

CLINICAL DATA: Chest pain

EXAM:
CHEST - 2 VIEW

[w chest lat]
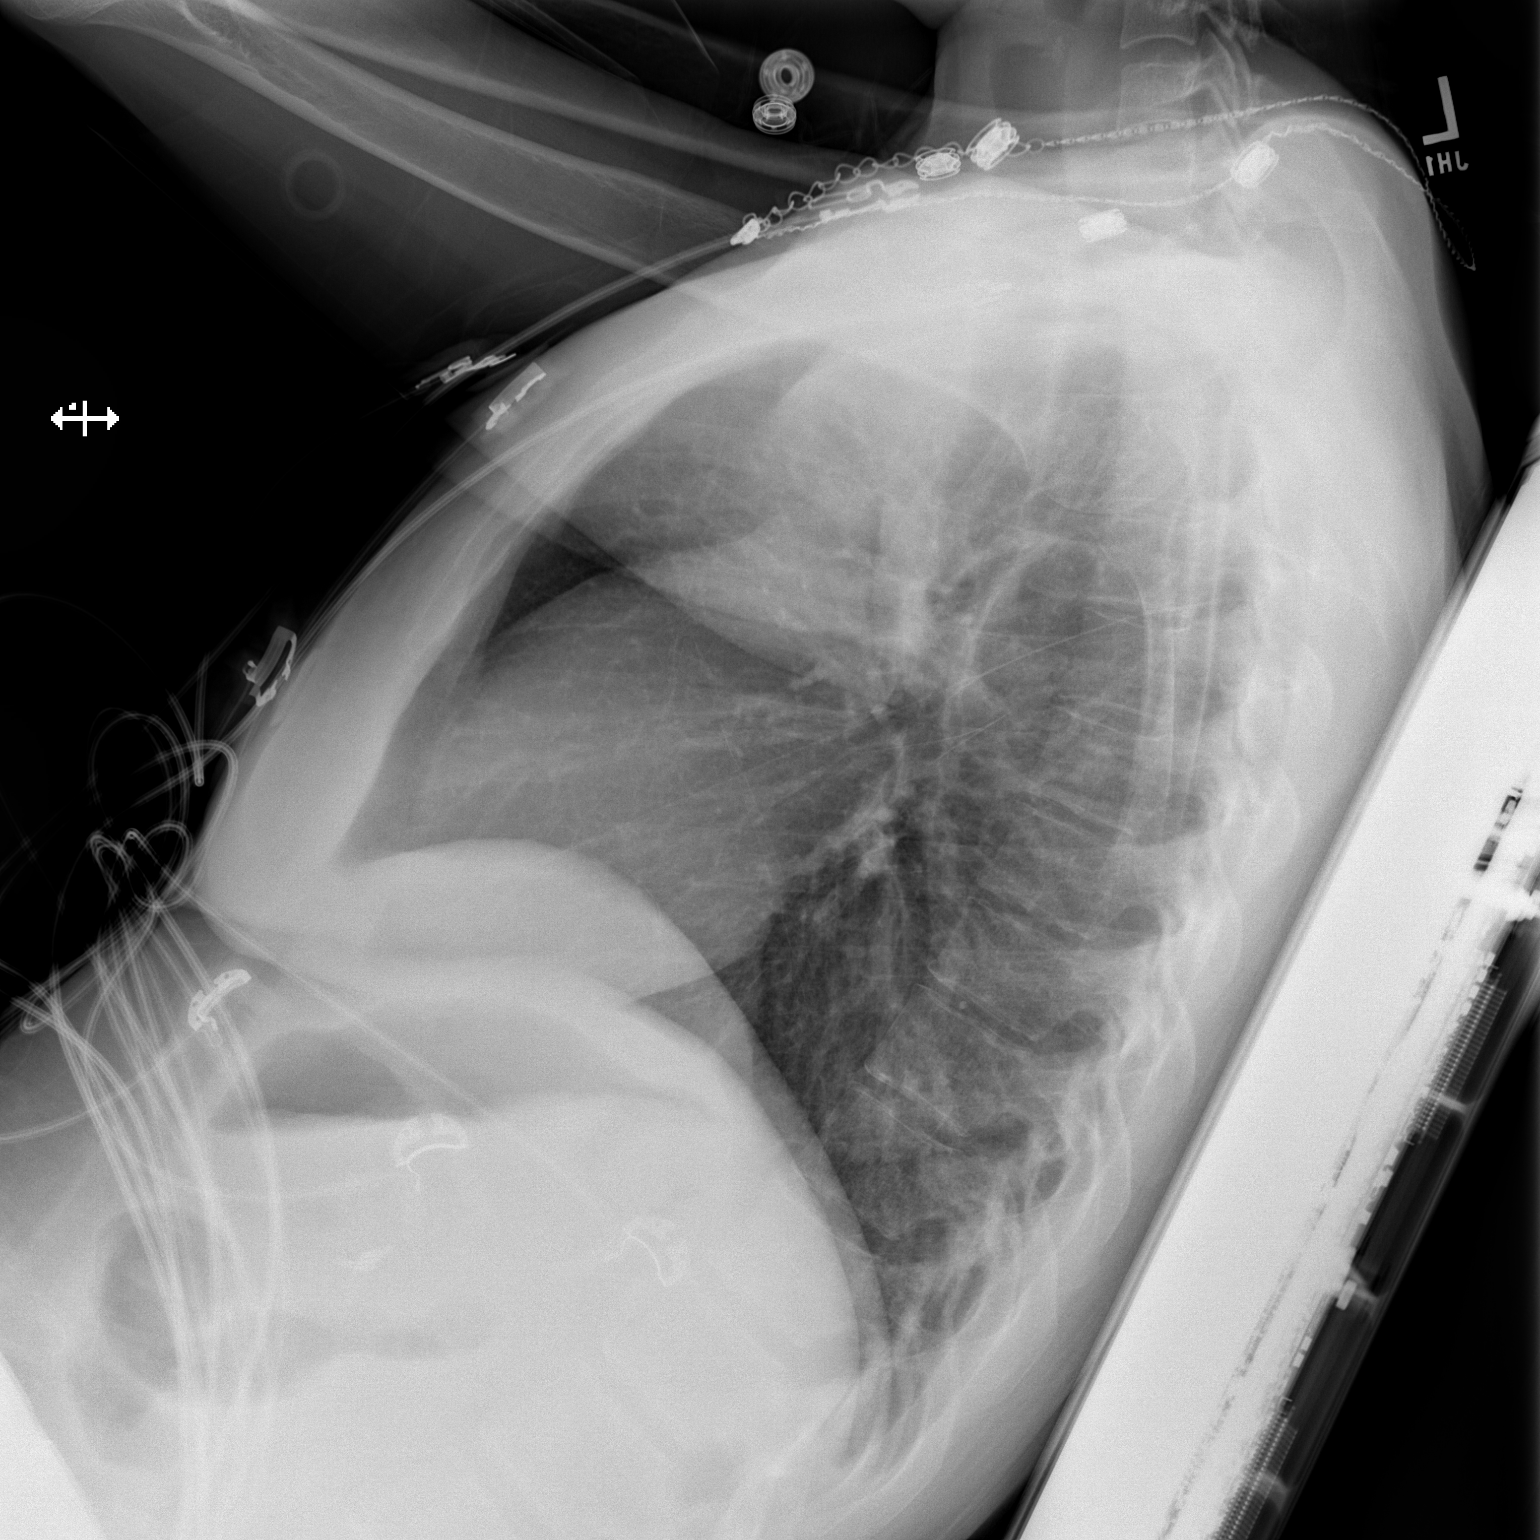

[x chest ap]
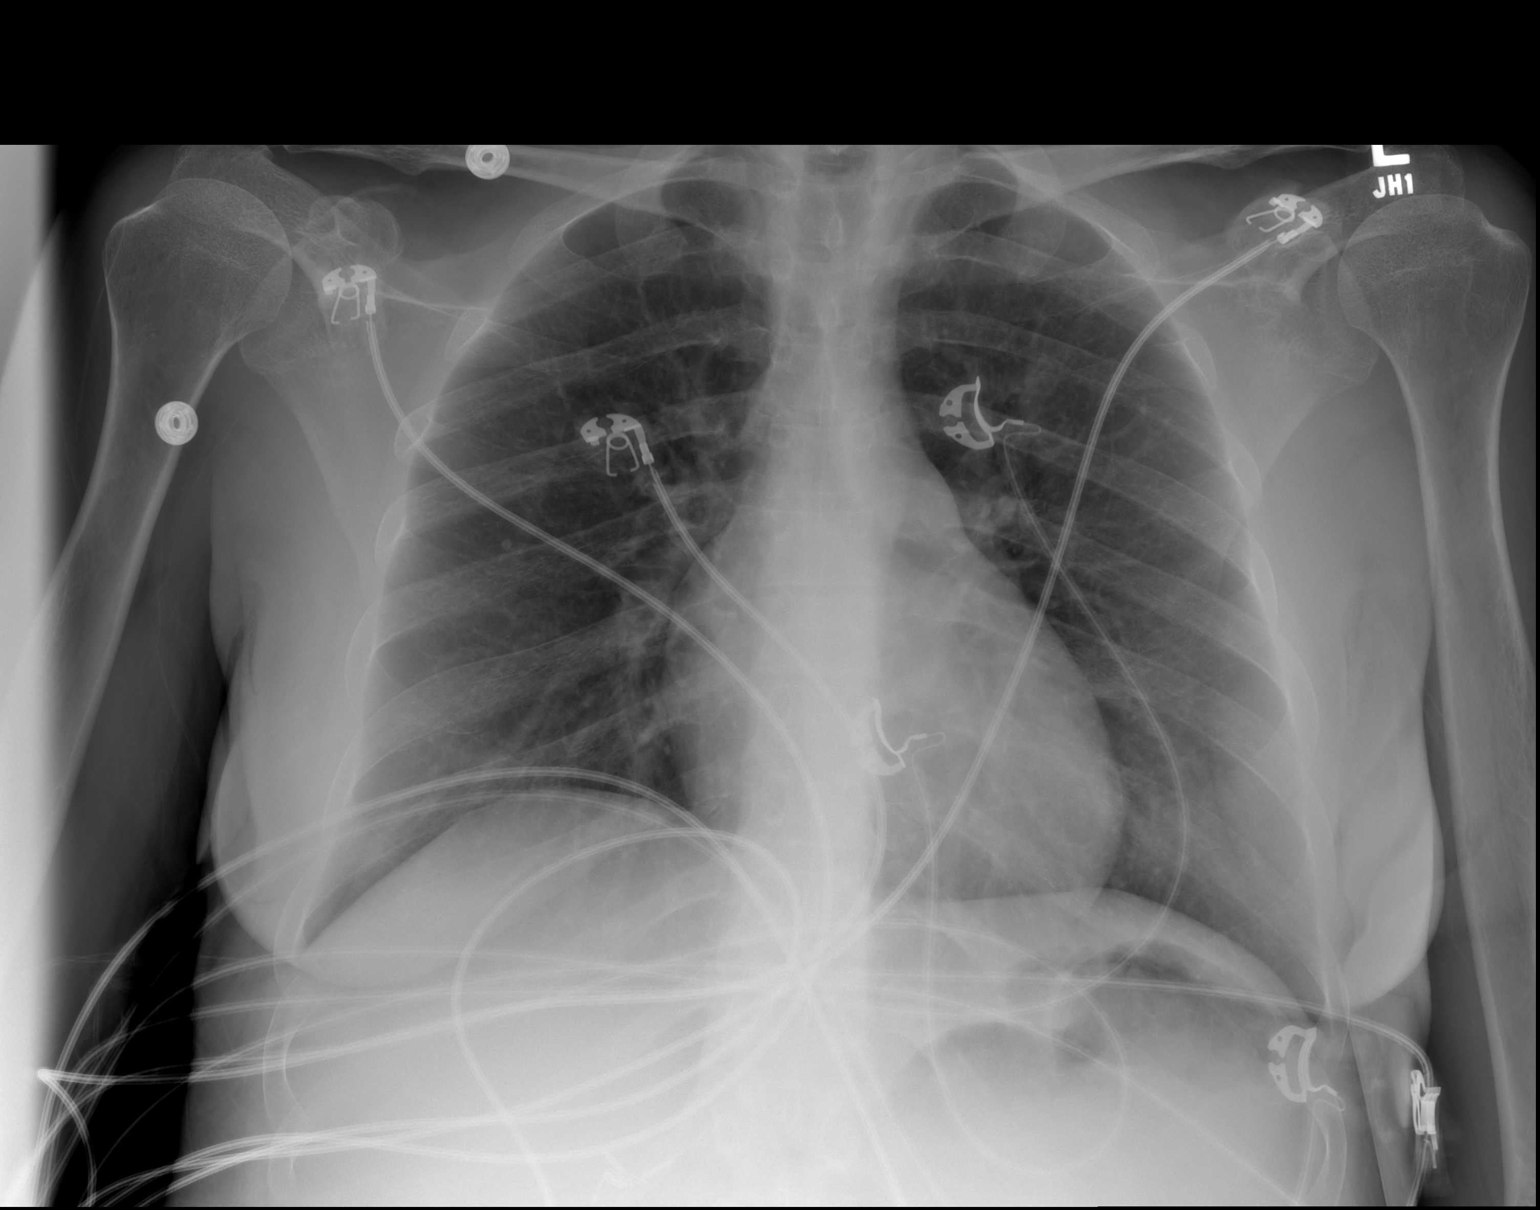

[2 of 2 positions shown; findings below may reference images not displayed]

FINDINGS: The heart size and mediastinal contours are within normal limits.
Both lungs are clear. The visualized skeletal structures are
unremarkable.
IMPRESSION: Normal chest.

## 2017-07-23 IMAGING — CR DG LUMBAR SPINE COMPLETE 4+V
5 series · 5 of 5 positions shown · non-contrast
Comparison: CT of the abdomen and pelvis on [DATE]

CLINICAL DATA: Per order- back pain Pt states she is having lower
mid line back pain today. Pt states no injury to back.

EXAM:
LUMBAR SPINE - COMPLETE 4+ VIEW

[t lumbar spine ap]
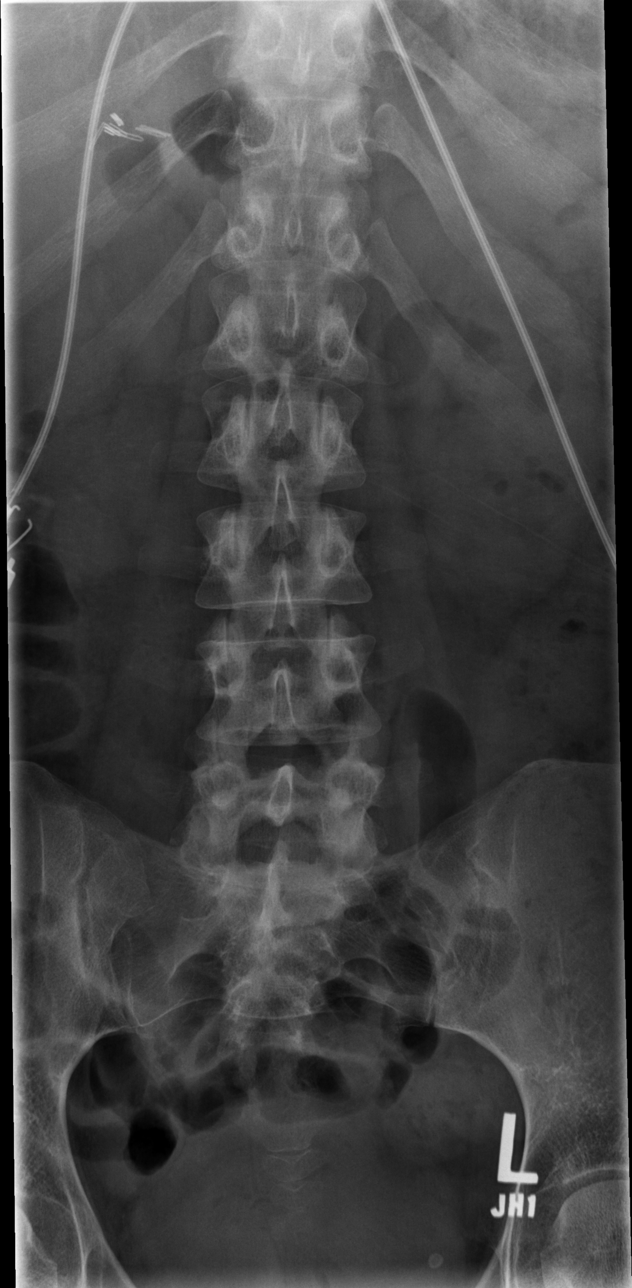

[t lumbar spine obl (1 of 2)]
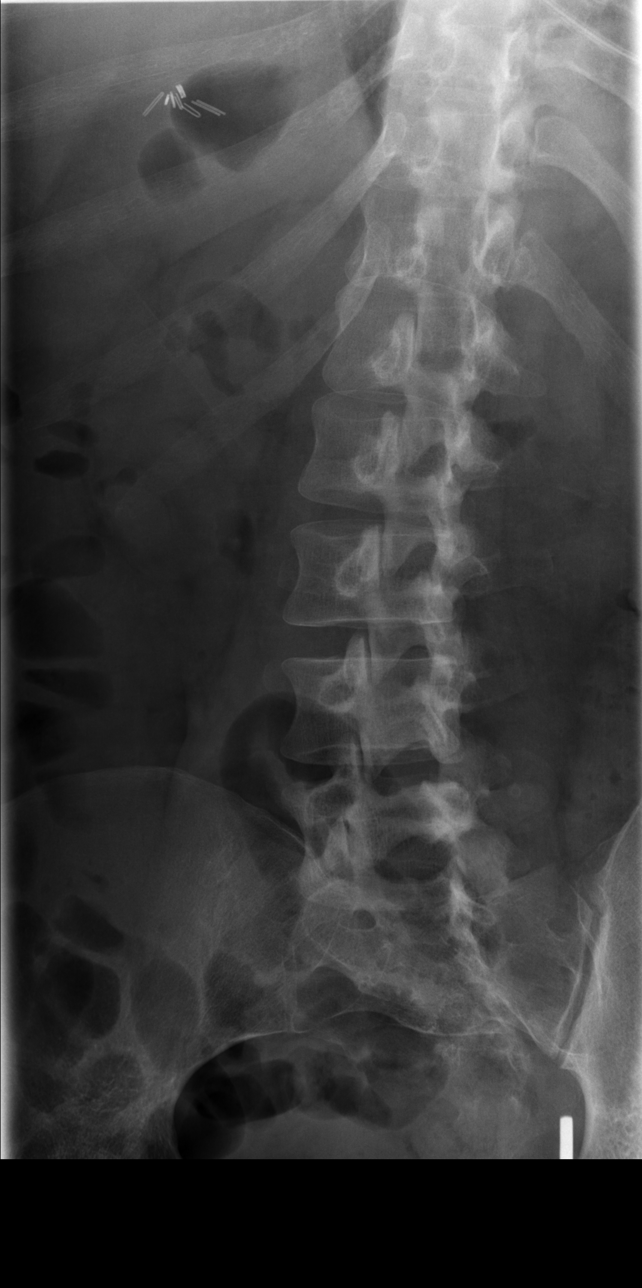

[t lumbar spine obl (2 of 2)]
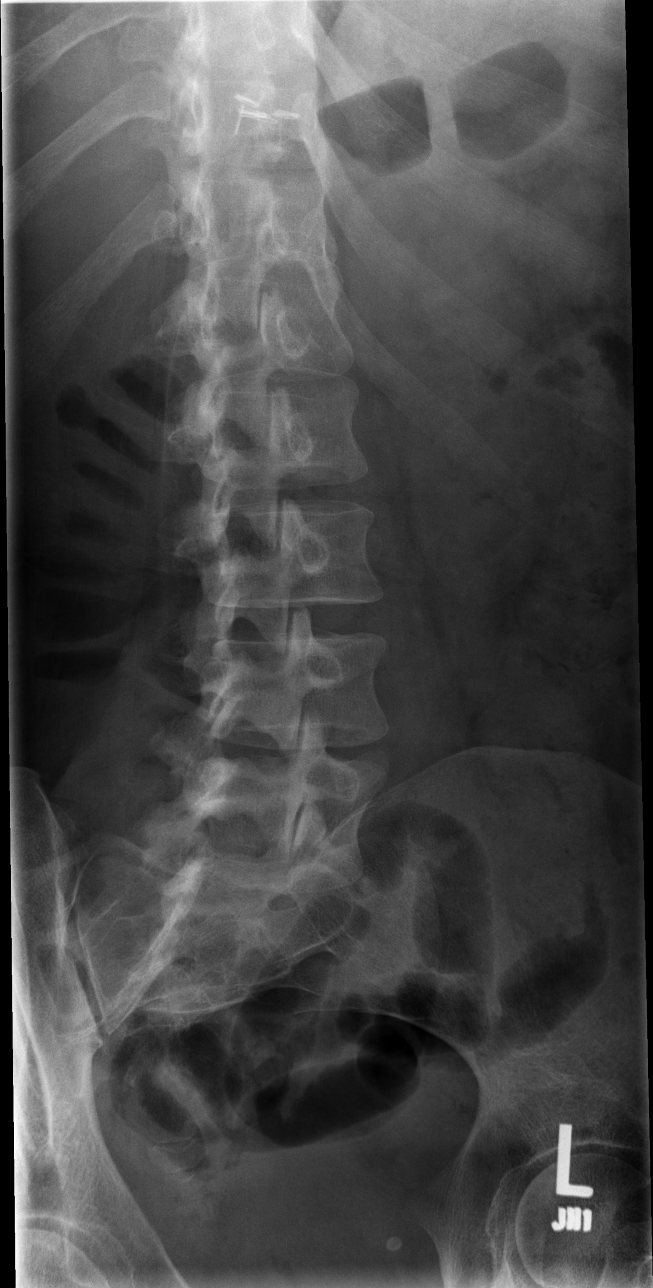

[t lumbar spine lat]
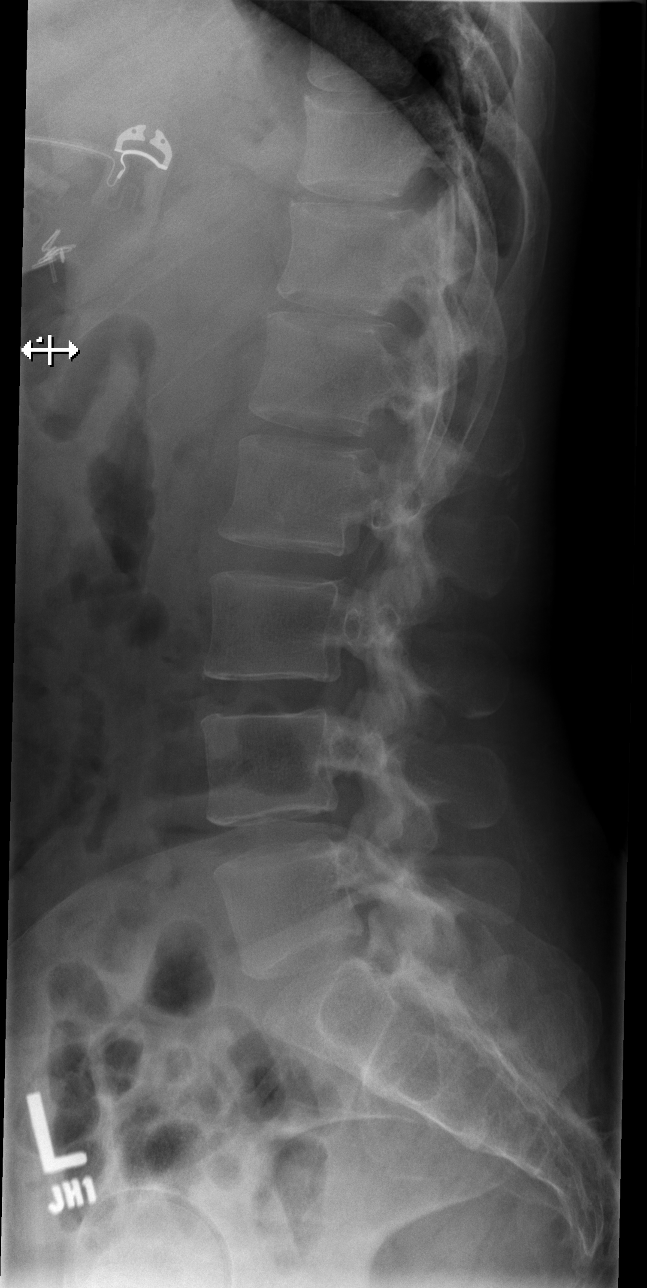

[t lumbar l-5 s-1 spot]
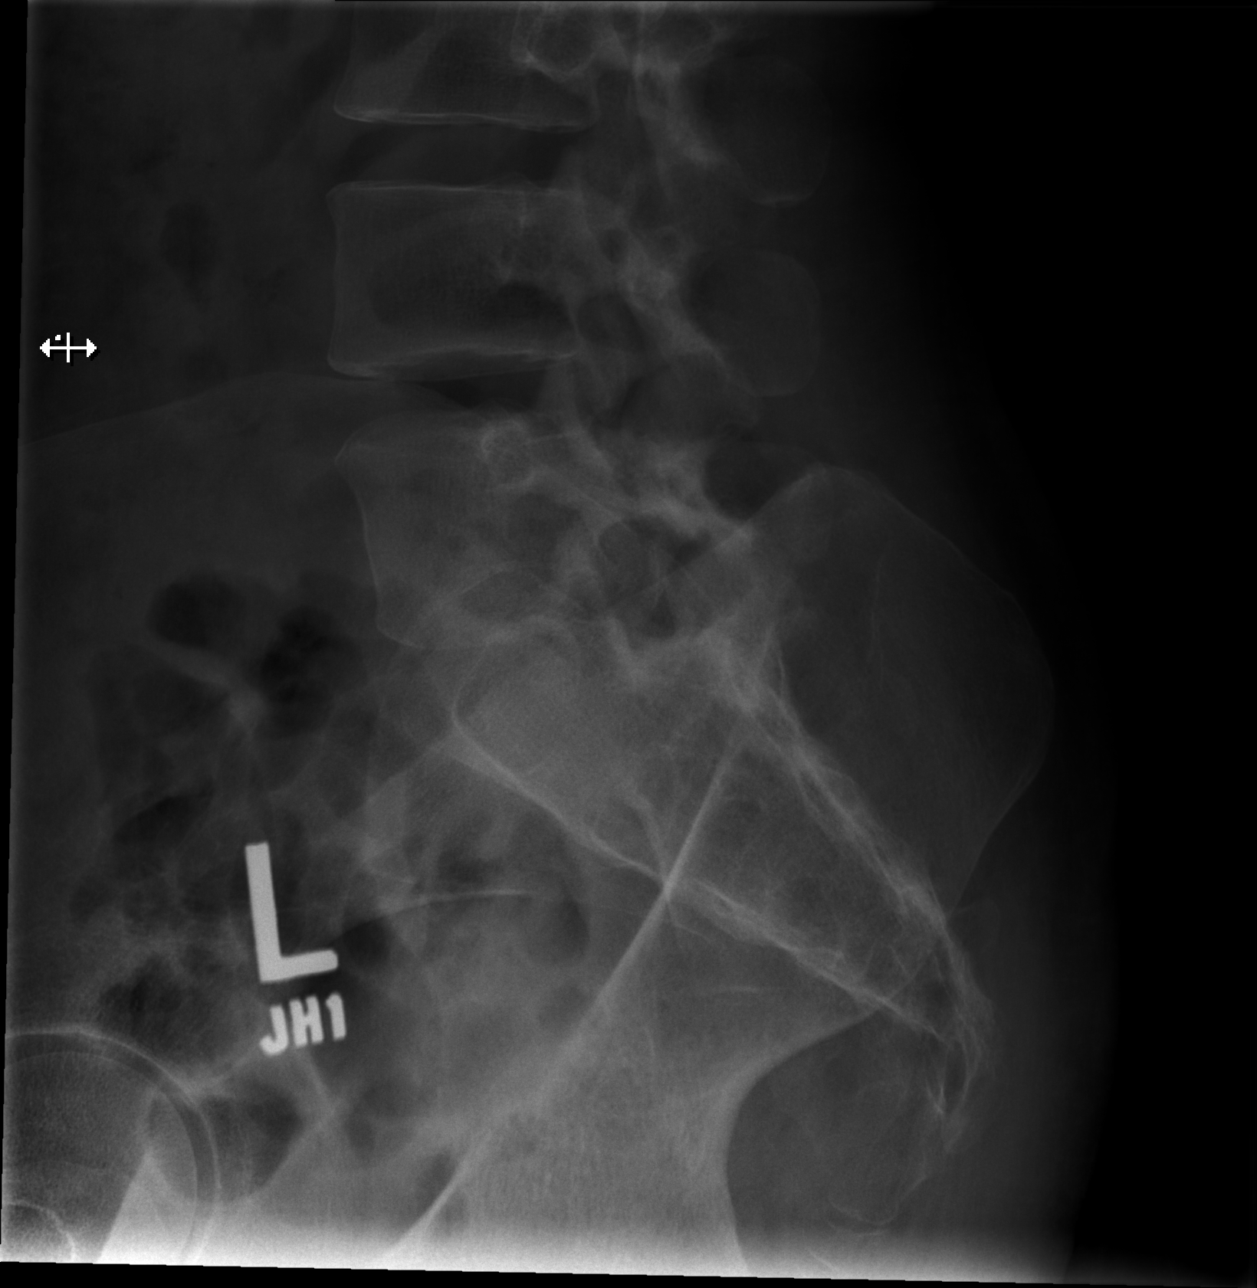

[5 of 5 positions shown; findings below may reference images not displayed]

FINDINGS: There is no evidence of lumbar spine fracture. Alignment is normal.
Intervertebral disc spaces are maintained. Surgical clips are
present in the RIGHT UPPER QUADRANT the abdomen.
IMPRESSION: Negative.

## 2017-07-23 MED ORDER — SODIUM CHLORIDE 0.9 % IV BOLUS
1000.0000 mL | Freq: Once | INTRAVENOUS | Status: AC
  Administered 2017-07-23: 1000 mL via INTRAVENOUS

## 2017-07-23 MED ORDER — FENTANYL CITRATE (PF) 100 MCG/2ML IJ SOLN: 50.0000 ug | Freq: Once | INTRAMUSCULAR | Status: DC

## 2017-07-23 MED ORDER — INSULIN ASPART 100 UNIT/ML ~~LOC~~ SOLN
4.0000 [IU] | Freq: Once | SUBCUTANEOUS | Status: AC
  Administered 2017-07-23: 4 [IU] via SUBCUTANEOUS
  Filled 2017-07-23: qty 1

## 2017-07-23 MED ORDER — MORPHINE SULFATE (PF) 4 MG/ML IV SOLN
4.0000 mg | Freq: Once | INTRAVENOUS | Status: AC
  Administered 2017-07-23: 4 mg via INTRAVENOUS
  Filled 2017-07-23: qty 1

## 2017-07-23 MED ORDER — ONDANSETRON HCL 4 MG/2ML IJ SOLN
4.0000 mg | Freq: Once | INTRAMUSCULAR | Status: AC
Start: 2017-07-23 — End: 2017-07-23
  Administered 2017-07-23: 4 mg via INTRAVENOUS
  Filled 2017-07-23: qty 2

## 2017-07-23 MED ORDER — INSULIN ASPART 100 UNIT/ML ~~LOC~~ SOLN
6.0000 [IU] | Freq: Once | SUBCUTANEOUS | Status: AC
  Administered 2017-07-23: 6 [IU] via SUBCUTANEOUS
  Filled 2017-07-23: qty 1

## 2017-07-23 NOTE — Discharge Instructions (Signed)
Take your insulin as directed.  Make sure you are staying hydrated and drink plenty of fluid.  Follow-up with referred [facility] to establish primary care.  Return to the emergency department for new or worsening pain, fever, persistent vomiting, inability to eat or drink or any other worsening or concerning symptoms.

## 2017-07-23 NOTE — ED Provider Notes (Signed)
[facility]-EMERGENCY DEPT Provider Note   CSN: [id number] Arrival date & time: 07/23/17  1147     History   Chief Complaint Chief Complaint  Patient presents with  . Hyperglycemia  . Generalized Body Aches    HPI [name] is a 27 y.o. female personal history of type 1 diabetes, fibromyalgia, gastroparesis who presents for evaluation of hyperglycemia, generalized weakness.  Patient reports that for the last 3 to 4 days, she has had nausea, vomiting, diarrhea.  States emesis is nonbloody, nonbilious.  No blood noted diarrhea.  She states she is not having any abdominal pain.  Patient reports she has also has had some intermittent chest pain and shortness of breath.  She states that the chest pain is not worse with deep inspiration.  It is worse with exertion.  She denies any associated nausea, vomiting, diaphoresis.  Patient reports that she had a syncopal episode yesterday in the bathroom.  Patient states that she did not hit her head.  She reports that she landed on her back.  Patient reports that she did not have any preceding chest pain, dizziness.  Husband was in the house and states that he found her within a few seconds and states that she was slightly altered but did not lose consciousness.  Patient has been acting appropriately since.  She is not currently on blood thinners.  Patient denies any abdominal pain, dysuria, hematuria, fever  She does report a history of DKA and states that she will aspect of DKA in December 2018. Patient denies any smoking.   The history is provided by the patient.    Past Medical History:  Diagnosis Date  . Anxiety   . Diabetes type 1, uncontrolled (HCC) 11/14/2011   Since age 30  . Fibromyalgia   . Gastroparesis   . Hypertension   . Infection    UTI April 2016    Patient Active Problem List   Diagnosis Date Noted  . Normocytic anemia 07/10/2017  . SIRS (systemic inflammatory response syndrome) (HCC) 06/23/2017  .  Hypokalemia 06/23/2017  . Normocytic normochromic anemia 06/23/2017  . GERD (gastroesophageal reflux disease) 06/23/2017  . Intractable nausea and vomiting 04/11/2017  . Nausea and vomiting 04/10/2017  . Essential hypertension   . Colitis   . Abscess and cellulitis of gluteal region 03/24/2017  . Cellulitis and abscess of buttock 03/23/2017  . Malnutrition of moderate degree 03/19/2017  . Anasarca 03/16/2017  . 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 Diarrhea 02/27/2017  . Lower GI bleed 02/27/2017  . DKA (diabetic ketoacidosis) (HCC) 02/26/2017  . Acute metabolic encephalopathy   . DKA, type 1 (HCC) 06/11/2013  . Orthostatic hypotension dysautonomic syndrome (HCC) 04/28/2013  . Body aches 04/28/2013  . Orthostatic hypotension 04/28/2013  . Diabetes mellitus type 1 (HCC) 04/28/2013  . Protein-calorie malnutrition, severe (HCC) 04/28/2013  . Noncompliance with diabetes treatment 09/01/2012  . Dyspnea 02/28/2012  . Sinus tachycardia 02/28/2012  . Hyperglycemia without ketosis 02/28/2012  . Chest pain 12/04/2011  . Insulin dependent diabetes mellitus (HCC) 11/14/2011  . Hypertension associated with diabetes (HCC) 11/14/2011  . Gastroparesis 11/14/2011  . Hyponatremia 09/19/2011  . Chronic abdominal pain 09/18/2011  . Metabolic alkalosis 09/18/2011  . Diabetic hyperosmolar non-ketotic state (HCC) 09/18/2011  . DIAB W/UNSPEC COMP TYPE I [JUV TYPE] UNCNTRL 06/04/2010  . CONSTIPATION 01/01/2010  . RECTAL PAIN 01/01/2010    Past Surgical History:  Procedure Laterality Date  . ANKLE SURGERY    .  CHOLECYSTECTOMY  11/15/2011   Procedure: LAPAROSCOPIC CHOLECYSTECTOMY WITH INTRAOPERATIVE CHOLANGIOGRAM;  Surgeon: [name], MD;  Location: [facility];  Service: General;  Laterality: N/A;  . COLONOSCOPY WITH PROPOFOL N/A 06/27/2017   Procedure: COLONOSCOPY WITH PROPOFOL;  Surgeon: Rachael Fee, MD;  Location: [facility];  Service: Endoscopy;   Laterality: N/A;  . ESOPHAGOGASTRODUODENOSCOPY  12/03/2011   Procedure: ESOPHAGOGASTRODUODENOSCOPY (EGD);  Surgeon: Theda Belfast, MD;  Location: [facility];  Service: Endoscopy;  Laterality: N/A;  . FLEXIBLE SIGMOIDOSCOPY N/A 03/10/2017   Procedure: FLEXIBLE SIGMOIDOSCOPY;  Surgeon: Jeani Hawking, MD;  Location: [facility];  Service: Endoscopy;  Laterality: N/A;  . INCISION AND DRAINAGE PERIRECTAL ABSCESS N/A 03/01/2017   Procedure: IRRIGATION AND DEBRIDEMENT PERIRECTAL ABSCESS;  Surgeon: Ovidio Kin, MD;  Location: [facility];  Service: General;  Laterality: N/A;  . IRRIGATION AND DEBRIDEMENT BUTTOCKS N/A 03/23/2017   Procedure: IRRIGATION AND DEBRIDEMENT BUTTOCKS, SETON PLACEMENT;  Surgeon: Romie Levee, MD;  Location: [facility];  Service: General;  Laterality: N/A;  . LAPAROSCOPY  11/23/2011   Procedure: LAPAROSCOPY DIAGNOSTIC;  Surgeon: Mariella Saa, MD;  Location: [facility];  Service: General;  Laterality: N/A;     OB History    Gravida  1   Para  1   Term      Preterm  1   AB      Living  1     SAB      TAB      Ectopic      Multiple  0   Live Births  1            Home Medications    Prior to Admission medications   Medication Sig Start Date End Date Taking? Authorizing Provider  acetaminophen (TYLENOL) 500 MG tablet Take 1,000 mg by mouth daily as needed for moderate pain.    Yes [provider]  calcium carbonate (TUMS - DOSED IN MG ELEMENTAL CALCIUM) 500 MG chewable tablet Chew 1 tablet (200 mg of elemental calcium total) by mouth 3 (three) times daily. 07/13/17  Yes Emokpae, Courage, MD  cholestyramine light (PREVALITE) 4 g packet Take 1 packet (4 g total) by mouth 3 (three) times daily. Patient taking differently: Take 4 g by mouth 2 (two) times daily as needed (diarrhea).  03/14/17  Yes Rolly Salter, MD  citalopram (CELEXA) 20 MG tablet Take 1 tablet (20 mg total) by mouth daily. 07/13/17  Yes Shon Hale, MD  ferrous sulfate 325 (65  FE) MG tablet Take 1 tablet (325 mg total) by mouth 2 (two) times daily with a meal. 05/13/17  Yes Henson, Vickie L, NP-C  gabapentin (NEURONTIN) 300 MG capsule Take 1 capsule (300 mg total) by mouth 2 (two) times daily. 04/03/17  Yes Henson, Vickie L, NP-C  HYDROcodone-acetaminophen (NORCO/VICODIN) 5-325 MG tablet Take 1 tablet by mouth every 6 (six) hours as needed for moderate pain or severe pain. 07/13/17  Yes Emokpae, Courage, MD  insulin lispro (HUMALOG) 100 UNIT/ML injection Inject 0.02 mLs (2 Units total) into the skin 3 (three) times daily with meals. Patient taking differently: Inject 0-9 Units into the skin 3 (three) times daily with meals. Sliding scale 04/09/17  Yes Romero Belling, MD  LANTUS 100 UNIT/ML injection Inject 0.12 mLs (12 Units total) into the skin daily. 07/13/17  Yes Emokpae, Courage, MD  lidocaine (LIDODERM) 5 % Place 1 patch onto the skin daily. Remove & Discard patch within 12 hours or as directed by MD Patient taking differently:  Place 1 patch onto the skin daily as needed (pain). Remove & Discard patch within 12 hours or as directed by MD 05/21/17  Yes Khatri, Hina, PA-C  metoCLOPramide (REGLAN) 5 MG tablet Take 5 mg by mouth every 6 (six) hours as needed for nausea or vomiting.    Yes [provider]  ondansetron (ZOFRAN ODT) 4 MG disintegrating tablet Take 1 tablet (4 mg total) by mouth every 8 (eight) hours as needed for nausea or vomiting. 07/09/17  Yes Shaune Pollack, MD  sucralfate (CARAFATE) 1 GM/10ML suspension Take 10 mLs (1 g total) by mouth 4 (four) times daily -  with meals and at bedtime. 06/17/17  Yes Upstill, Melvenia Beam, PA-C  Alum & Mag Hydroxide-Simeth (GI COCKTAIL) SUSP suspension Take 30 mLs by mouth 3 (three) times daily as needed for indigestion. Shake well. Patient not taking: Reported on 07/23/2017 07/13/17   Shon Hale, MD  cyclobenzaprine (FLEXERIL) 10 MG tablet Take 1 tablet (10 mg total) by mouth 2 (two) times daily as needed for muscle  spasms. Patient not taking: Reported on 07/23/2017 05/21/17   Dietrich Pates, PA-C  famotidine (PEPCID) 40 MG tablet Take 1 tablet (40 mg total) by mouth 2 (two) times daily. Patient not taking: Reported on 07/23/2017 06/21/17   Bethel Born, PA-C  furosemide (LASIX) 40 MG tablet Take 40 mg by mouth daily as needed for fluid.     [provider]  glucose 4 GM chewable tablet Chew 1 tablet (4 g total) by mouth as needed for low blood sugar. Patient not taking: Reported on 07/23/2017 07/13/17   Shon Hale, MD  lactobacillus (FLORANEX/LACTINEX) PACK Take 1 packet (1 g total) by mouth 3 (three) times daily with meals. Patient not taking: Reported on 07/23/2017 07/13/17   Shon Hale, MD  lidocaine (XYLOCAINE) 2 % solution Use as directed 15 mLs in the mouth or throat every 6 (six) hours as needed (stomach pain). Patient not taking: Reported on 07/23/2017 05/31/17   Nira Conn, MD  lipase/protease/amylase (CREON) 36000 UNITS CPEP capsule Take 1 capsule (36,000 Units total) by mouth 3 (three) times daily with meals. Patient not taking: Reported on 07/23/2017 03/14/17   Rolly Salter, MD  Multiple Vitamin (MULTIVITAMIN WITH MINERALS) TABS tablet Take 1 tablet by mouth daily. Patient not taking: Reported on 07/23/2017 07/14/17   Shon Hale, MD  ondansetron (ZOFRAN) 4 MG tablet Take 1 tablet (4 mg total) by mouth every 6 (six) hours as needed for nausea. Patient not taking: Reported on 07/23/2017 07/13/17   Shon Hale, MD  potassium chloride SA (K-DUR,KLOR-CON) 20 MEQ tablet Take 2 tablets (40 mEq total) by mouth daily for 3 days. 07/13/17 07/16/17  Shon Hale, MD    Family History Family History  Problem Relation Age of Onset  . Diabetes Mother   . Hypertension Father     Social History Social History   Tobacco Use  . Smoking status: Never Smoker  . Smokeless tobacco: Never Used  Substance Use Topics  . Alcohol use: No    Alcohol/week: 0.0 oz     Frequency: Never  . Drug use: No     Allergies   Other and Lactose intolerance (gi)   Review of Systems Review of Systems  Constitutional: Negative for fever.  Respiratory: Negative for cough and shortness of breath.   Cardiovascular: Positive for chest pain.  Gastrointestinal: Positive for diarrhea, nausea and vomiting. Negative for abdominal pain and blood in stool.  Genitourinary: Negative for dysuria and hematuria.  Neurological: Positive for light-headedness. Negative for headaches.  All other systems reviewed and are negative.    Physical Exam Updated Vital Signs BP (!) 139/96   Pulse 92   Temp 97.6 F (36.4 C) (Oral)   Resp 18   LMP  (Within Months)   SpO2 100%   Physical Exam  Constitutional: She is oriented to person, place, and time. She appears well-developed and well-nourished.  HENT:  Head: Normocephalic and atraumatic.  Mouth/Throat: Oropharynx is clear and moist and mucous membranes are normal.  No tenderness to palpation of skull. No deformities or crepitus noted. No open wounds, abrasions or lacerations.   Eyes: Pupils are equal, round, and reactive to light. Conjunctivae, EOM and lids are normal.  Neck: Full passive range of motion without pain.  Cardiovascular: Normal rate, regular rhythm, normal heart sounds and normal pulses. Exam reveals no gallop and no friction rub.  No murmur heard. Pulmonary/Chest: Effort normal and breath sounds normal.  No evidence of respiratory distress. Able to speak in full sentences without difficulty.  Abdominal: Soft. Normal appearance. There is no tenderness. There is no rigidity and no guarding.  Abdomen is soft, non-distended, non-tender.   Musculoskeletal: Normal range of motion.       Thoracic back: She exhibits no tenderness.       Lumbar back: She exhibits tenderness.  Neurological: She is alert and oriented to person, place, and time.  Cranial nerves III-XII intact Follows commands, Moves all extremities   5/5 strength to BUE and BLE  Sensation intact throughout all major nerve distributions Normal finger to nose. No dysdiadochokinesia. No pronator drift. No gait abnormalities  No slurred speech. No facial droop.   Skin: Skin is warm and dry. Capillary refill takes less than 2 seconds.  Psychiatric: She has a normal mood and affect. Her speech is normal.  Nursing note and vitals reviewed.    ED Treatments / Results  Labs (all labs ordered are listed, but only abnormal results are displayed) Labs Reviewed  BASIC METABOLIC PANEL - Abnormal; Notable for the following components:      Result Value   Sodium 130 (*)    Potassium 5.2 (*)    Chloride 88 (*)    Glucose, Bld 653 (*)    BUN 25 (*)    Calcium 10.4 (*)    All other components within normal limits  URINALYSIS, ROUTINE W REFLEX MICROSCOPIC - Abnormal; Notable for the following components:   Color, Urine STRAW (*)    Glucose, UA >=500 (*)    Hgb urine dipstick SMALL (*)    Ketones, ur 5 (*)    All other components within normal limits  CBG MONITORING, ED - Abnormal; Notable for the following components:   Glucose-Capillary 565 (*)    All other components within normal limits  CBG MONITORING, ED - Abnormal; Notable for the following components:   Glucose-Capillary 546 (*)    All other components within normal limits  CBG MONITORING, ED - Abnormal; Notable for the following components:   Glucose-Capillary 387 (*)    All other components within normal limits  I-STAT CHEM 8, ED - Abnormal; Notable for the following components:   Chloride 98 (*)    Glucose, Bld 342 (*)    Hemoglobin 9.5 (*)    HCT 28.0 (*)    All other components within normal limits  CBC  I-STAT BETA HCG BLOOD, ED ([facility], [facility], [facility] ONLY)  I-STAT TROPONIN, ED    EKG EKG Interpretation  Date/Time:  Thursday July 23 2017 13:51:18 EDT Ventricular Rate:  110 PR Interval:    QRS Duration: 77 QT Interval:  325 QTC Calculation: 440 R Axis:   66 Text  Interpretation:  Sinus tachycardia Non-specific ST-t changes since last tracing no significant change Confirmed by Mancel Bale [phone]) on 07/23/2017 1:56:53 PM Also confirmed by Mancel Bale [phone]), editor Sheppard Evens ([id number])  on 07/23/2017 2:59:03 PM   Radiology Dg Chest 2 View  Result Date: 07/23/2017 CLINICAL DATA:  Chest pain EXAM: CHEST - 2 VIEW COMPARISON:  Chest radiograph 07/10/2017 FINDINGS: The heart size and mediastinal contours are within normal limits. Both lungs are clear. The visualized skeletal structures are unremarkable. IMPRESSION: Normal chest. Electronically Signed   By: Deatra Robinson M.D.   On: 07/23/2017 17:41   Dg Lumbar Spine Complete  Result Date: 07/23/2017 CLINICAL DATA:  Per order- back pain Pt states she is having lower mid line back pain today. Pt states no injury to back. EXAM: LUMBAR SPINE - COMPLETE 4+ VIEW COMPARISON:  CT of the abdomen and pelvis on 06/19/2017 FINDINGS: There is no evidence of lumbar spine fracture. Alignment is normal. Intervertebral disc spaces are maintained. Surgical clips are present in the RIGHT UPPER QUADRANT the abdomen. IMPRESSION: Negative. Electronically Signed   By: Norva Pavlov M.D.   On: 07/23/2017 19:27    Procedures Procedures (including critical care time)  Medications Ordered in ED Medications  sodium chloride 0.9 % bolus 1,000 mL (0 mLs Intravenous Stopped 07/23/17 1718)  insulin aspart (novoLOG) injection 6 Units (6 Units Subcutaneous Given 07/23/17 1549)  ondansetron (ZOFRAN) injection 4 mg (4 mg Intravenous Given 07/23/17 1548)  sodium chloride 0.9 % bolus 1,000 mL (0 mLs Intravenous Stopped 07/23/17 2053)  insulin aspart (novoLOG) injection 4 Units (4 Units Subcutaneous Given 07/23/17 1722)  morphine 4 MG/ML injection 4 mg (4 mg Intravenous Given 07/23/17 1730)     Initial Impression / Assessment and Plan / ED Course  I have reviewed the triage vital signs and the nursing notes.  Pertinent labs & imaging  results that were available during my care of the patient were reviewed by me and considered in my medical decision making (see chart for details).     27 year old female past medical history of fibromyalgia, type 1 diabetes who presents for evaluation of 3 to 4 days of intermittent chest pain, difficulty breathing, lightheadedness, nausea/vomiting, diarrhea.  Reports a syncopal episode yesterday.  No preceding chest pain, dizziness.  Husband was there within a few seconds states the patient was out of it but did not lose consciousness.  No fevers, abdominal pain.  Does have a history of DKA.  On initial ED arrival, patient is afebrile, tachycardic.  Vital signs reviewed and stable.  Abdomen is non-tender on exam.  No neuro deficits noted on exam.  She states she did not hit her head.  She is not currently on blood thinners.  Given reassuring physical exam and per [facility] CT criteria, no imaging is indicated at this time.  Consider acute infectious etiology versus DKA versus ACS etiology.  Plan to check basic labs, chest x-ray, EKG, troponin.  Beta negative.  UA shows glucose, small amount of hemoglobin.  Small amount of ketones noted.  BMP shows sodium 130, potassium of 5.2.  Chloride is 88.  Glucose is 653.  Bicarb is 28.  BUN is 25.  Anion gap is 14.  Patient does not appear to be in DKA.  Anion gap is likely reflective  of hypochloremic alkalosis from nausea/vomiting, diarrhea.  CBC shows no leukocytosis, anemia.  Troponin negative.  We will plan for repeat CBG after insulin dosage.  Chest x-ray shows no abnormalities.  Number x-ray negative for any acute abnormalities.  Repeat CBG is improved but still elevated.  Will give additional insulin dose.  Heart rate has improved after fluids.  Repeat BMP shows potassium is improved.  BUN and creatinine are unremarkable.  Chloride has improved.  Anion gap has improved.  Plan to p.o. challenge patient in the department.  Patient able to tolerate p.o. in  the department any difficulty.  Repeat abdominal exam shows no tenderness.  Vital signs stable.  I discussed with patient regarding her work-up here in the ED.  We discussed at length regarding discharge home or admission.  I offered patient admission but patient states she does not want to be admitted would rather go home.  Given that she is tolerating p.o. in the department, abdomen is benign, feel that this is reasonable.  Plan for outpatient follow-up with [facility].  Patient had ample opportunity for questions and discussion. All patient's questions were answered with full understanding. Strict return precautions discussed. Patient expresses understanding and agreement to plan.   Final Clinical Impressions(s) / ED Diagnoses   Final diagnoses:  Hyperglycemia  Non-intractable vomiting with nausea, unspecified vomiting type    ED Discharge Orders    None       Rosana Hoes 07/23/17 2321    Mancel Bale, MD 07/25/17 [phone]

## 2017-07-23 NOTE — ED Notes (Signed)
NS 1000ml given

## 2017-07-23 NOTE — ED Notes (Signed)
CRITICAL VALUE STICKER  CRITICAL VALUE: Glucose 653  RECEIVER (on-site recipient of call): Jake T RN  DATE & TIME NOTIFIED: 1323  MESSENGER (representative from lab): K. Tibbets  MD NOTIFIED: Triage RN made aware and patient brought back. PA Mardella Layman made aware  TIME OF NOTIFICATION: 1330  RESPONSE: see orders

## 2017-07-23 NOTE — ED Notes (Signed)
Pt provided water and diet ginger ale

## 2017-07-23 NOTE — ED Triage Notes (Addendum)
Pt is alert and oriented x 4. Pt reports generalized body aches x 3 days.Pt reports n/v/d. Pt reports that she had a syncope episode in the bathroom last night and back is aches, denies any other injury. Pt reports feeling light headed and reports decreased appetite. Pt is escorted with spouse. Pt CBG obtained 565.

## 2017-07-25 ENCOUNTER — Other Ambulatory Visit: Payer: Self-pay

## 2017-07-25 ENCOUNTER — Encounter (HOSPITAL_COMMUNITY): Payer: Self-pay

## 2017-07-25 ENCOUNTER — Emergency Department (HOSPITAL_COMMUNITY)
Admission: EM | Admit: 2017-07-25 | Discharge: 2017-07-25 | Disposition: A | Discharge status: Home / Self Care | Payer: 59 | Source: Home / Self Care | Attending: Emergency Medicine | Admitting: Emergency Medicine

## 2017-07-25 DIAGNOSIS — R1013 Epigastric pain: Secondary | ICD-10-CM | POA: Diagnosis not present

## 2017-07-25 DIAGNOSIS — Z794 Long term (current) use of insulin: Secondary | ICD-10-CM

## 2017-07-25 DIAGNOSIS — E1010 Type 1 diabetes mellitus with ketoacidosis without coma: Secondary | ICD-10-CM | POA: Diagnosis not present

## 2017-07-25 DIAGNOSIS — E1065 Type 1 diabetes mellitus with hyperglycemia: Secondary | ICD-10-CM | POA: Diagnosis not present

## 2017-07-25 DIAGNOSIS — R000 Tachycardia, unspecified: Secondary | ICD-10-CM | POA: Diagnosis not present

## 2017-07-25 DIAGNOSIS — I10 Essential (primary) hypertension: Secondary | ICD-10-CM | POA: Insufficient documentation

## 2017-07-25 DIAGNOSIS — K3184 Gastroparesis: Secondary | ICD-10-CM

## 2017-07-25 DIAGNOSIS — E1043 Type 1 diabetes mellitus with diabetic autonomic (poly)neuropathy: Secondary | ICD-10-CM | POA: Insufficient documentation

## 2017-07-25 DIAGNOSIS — Z79899 Other long term (current) drug therapy: Secondary | ICD-10-CM | POA: Insufficient documentation

## 2017-07-25 DIAGNOSIS — E1143 Type 2 diabetes mellitus with diabetic autonomic (poly)neuropathy: Secondary | ICD-10-CM | POA: Diagnosis not present

## 2017-07-25 DIAGNOSIS — R112 Nausea with vomiting, unspecified: Secondary | ICD-10-CM | POA: Diagnosis not present

## 2017-07-25 LAB — CBC
HCT: 34.7 % — ABNORMAL LOW (ref 36.0–46.0)
Hemoglobin: 11.7 g/dL — ABNORMAL LOW (ref 12.0–15.0)
MCH: 29.2 pg (ref 26.0–34.0)
MCHC: 33.7 g/dL (ref 30.0–36.0)
MCV: 86.5 fL (ref 78.0–100.0)
Platelets: 230 10*3/uL (ref 150–400)
RBC: 4.01 MIL/uL (ref 3.87–5.11)
RDW: 12.7 % (ref 11.5–15.5)
WBC: 3 10*3/uL — ABNORMAL LOW (ref 4.0–10.5)

## 2017-07-25 LAB — I-STAT BETA HCG BLOOD, ED (MC, WL, AP ONLY): I-stat hCG, quantitative: <5 m[IU]/mL (ref <5)

## 2017-07-25 LAB — BLOOD GAS, VENOUS
Acid-Base Excess: 1 mmol/L (ref 0.0–2.0)
Bicarbonate: 26.6 mmol/L (ref 20.0–28.0)
O2 Saturation: 52.9 %
Patient temperature: 98.6
pCO2, Ven: 47.9 mmHg (ref 44.0–60.0)
pH, Ven: 7.364 (ref 7.250–7.430)

## 2017-07-25 LAB — BASIC METABOLIC PANEL
Anion gap: 15 (ref 5–15)
BUN: 12 mg/dL (ref 6–20)
CO2: 24 mmol/L (ref 22–32)
Calcium: 9.2 mg/dL (ref 8.9–10.3)
Chloride: 98 mmol/L — ABNORMAL LOW (ref 101–111)
Creatinine, Ser: 0.61 mg/dL (ref 0.44–1.00)
GFR calc Af Amer: >60 mL/min (ref >60)
GFR calc non Af Amer: >60 mL/min (ref >60)
Glucose, Bld: 425 mg/dL — ABNORMAL HIGH (ref 65–99)
Potassium: 4 mmol/L (ref 3.5–5.1)
Sodium: 137 mmol/L (ref 135–145)

## 2017-07-25 LAB — CBG MONITORING, ED
Glucose-Capillary: 406 mg/dL — ABNORMAL HIGH (ref 65–99)
Glucose-Capillary: 359 mg/dL — ABNORMAL HIGH (ref 65–99)
Glucose-Capillary: 288 mg/dL — ABNORMAL HIGH (ref 65–99)

## 2017-07-25 MED ORDER — METOCLOPRAMIDE HCL 5 MG/ML IJ SOLN
5.0000 mg | Freq: Once | INTRAMUSCULAR | Status: AC
  Administered 2017-07-25: 5 mg via INTRAVENOUS
  Filled 2017-07-25: qty 2

## 2017-07-25 MED ORDER — SODIUM CHLORIDE 0.9 % IV BOLUS
2000.0000 mL | Freq: Once | INTRAVENOUS | Status: AC
  Administered 2017-07-25: 2000 mL via INTRAVENOUS

## 2017-07-25 MED ORDER — MORPHINE SULFATE (PF) 4 MG/ML IV SOLN
4.0000 mg | Freq: Once | INTRAVENOUS | Status: AC
  Administered 2017-07-25: 4 mg via INTRAVENOUS
  Filled 2017-07-25: qty 1

## 2017-07-25 MED ORDER — LORAZEPAM 2 MG/ML IJ SOLN
0.5000 mg | Freq: Once | INTRAMUSCULAR | Status: AC
  Administered 2017-07-25: 0.5 mg via INTRAVENOUS
  Filled 2017-07-25: qty 1

## 2017-07-25 MED ORDER — SODIUM CHLORIDE 0.9 % IV SOLN: INTRAVENOUS | Status: DC

## 2017-07-25 MED ORDER — INSULIN ASPART 100 UNIT/ML ~~LOC~~ SOLN
8.0000 [IU] | Freq: Once | SUBCUTANEOUS | Status: AC
  Administered 2017-07-25: 8 [IU] via SUBCUTANEOUS
  Filled 2017-07-25: qty 1

## 2017-07-25 MED ORDER — DIPHENHYDRAMINE HCL 50 MG/ML IJ SOLN
12.5000 mg | Freq: Once | INTRAMUSCULAR | Status: AC
  Administered 2017-07-25: 12.5 mg via INTRAVENOUS
  Filled 2017-07-25: qty 1

## 2017-07-25 NOTE — ED Notes (Signed)
Pt attempted and was unable to produce a urine sample.

## 2017-07-25 NOTE — ED Provider Notes (Signed)
[facility] DEPT Provider Note   CSN: [id number] Arrival date & time: 07/25/17  1054     History   Chief Complaint Chief Complaint  Patient presents with  . Emesis  . Abdominal Pain  . Hyperglycemia    HPI JOSEPHINE WOOLDRIDGE is a 27 y.o. female.  27 year old female with history of insulin pen diabetes presents with 2 days of worsening abdominal discomfort with associated nonbilious emesis.  Does have a history of gastroparesis but she says this is different.  Patient complains of burning type sensation in her upper abdominal quadrant.  Some watery diarrhea.  No vaginal bleeding or discharge.  She does endorse polyuria as well as polydipsia.  Her sugars at home is been running in the 400s.  Was seen here for similar symptoms 2 days ago and that visit was reviewed and there was no signs of DKA at that time.  Denies any infectious symptoms at this time.  Patient used her home Phenergan at home without relief.     Past Medical History:  Diagnosis Date  . Anxiety   . Diabetes type 1, uncontrolled ([place]) 11/14/2011   Since age 51  . Fibromyalgia   . Gastroparesis   . Hypertension   . Infection    UTI April 2016    Patient Active Problem List   Diagnosis Date Noted  . Normocytic anemia 07/10/2017  . SIRS (systemic inflammatory response syndrome) ([place]) 06/23/2017  . Hypokalemia 06/23/2017  . Normocytic normochromic anemia 06/23/2017  . GERD (gastroesophageal reflux disease) 06/23/2017  . Intractable nausea and vomiting 04/11/2017  . Nausea and vomiting 04/10/2017  . Essential hypertension   . Colitis   . Abscess and cellulitis of gluteal region 03/24/2017  . Cellulitis and abscess of buttock 03/23/2017  . Malnutrition of moderate degree 03/19/2017  . Anasarca 03/16/2017  . 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 Diarrhea 02/27/2017  . Lower GI bleed 02/27/2017  . DKA (diabetic ketoacidosis) ([place]) 02/26/2017    . Acute metabolic encephalopathy   . DKA, type 1 ([place]) 06/11/2013  . Orthostatic hypotension dysautonomic syndrome ([place]) 04/28/2013  . Body aches 04/28/2013  . Orthostatic hypotension 04/28/2013  . Diabetes mellitus type 1 ([place]) 04/28/2013  . Protein-calorie malnutrition, severe ([place]) 04/28/2013  . Noncompliance with diabetes treatment 09/01/2012  . Dyspnea 02/28/2012  . Sinus tachycardia 02/28/2012  . Hyperglycemia without ketosis 02/28/2012  . Chest pain 12/04/2011  . Insulin dependent diabetes mellitus ([place]) 11/14/2011  . Hypertension associated with diabetes ([place]) 11/14/2011  . Gastroparesis 11/14/2011  . Hyponatremia 09/19/2011  . Chronic abdominal pain 09/18/2011  . Metabolic alkalosis 12/87/8676  . Diabetic hyperosmolar non-ketotic state ([place]) 09/18/2011  . DIAB W/UNSPEC COMP TYPE I [JUV TYPE] UNCNTRL 06/04/2010  . CONSTIPATION 01/01/2010  . RECTAL PAIN 01/01/2010    Past Surgical History:  Procedure Laterality Date  . ANKLE SURGERY    . CHOLECYSTECTOMY  11/15/2011   Procedure: LAPAROSCOPIC CHOLECYSTECTOMY WITH INTRAOPERATIVE CHOLANGIOGRAM;  Surgeon: [name], MD;  Location: [facility];  Service: General;  Laterality: N/A;  . COLONOSCOPY WITH PROPOFOL N/A 06/27/2017   Procedure: COLONOSCOPY WITH PROPOFOL;  Surgeon: Milus Banister, MD;  Location: [facility];  Service: Endoscopy;  Laterality: N/A;  . ESOPHAGOGASTRODUODENOSCOPY  12/03/2011   Procedure: ESOPHAGOGASTRODUODENOSCOPY (EGD);  Surgeon: Beryle Beams, MD;  Location: [facility];  Service: Endoscopy;  Laterality: N/A;  . FLEXIBLE SIGMOIDOSCOPY N/A 03/10/2017   Procedure: FLEXIBLE SIGMOIDOSCOPY;  Surgeon: Carol Ada, MD;  Location: [facility];  Service: Endoscopy;  Laterality: N/A;  . INCISION AND DRAINAGE PERIRECTAL ABSCESS N/A 03/01/2017   Procedure: IRRIGATION AND DEBRIDEMENT PERIRECTAL ABSCESS;  Surgeon: Alphonsa Overall, MD;  Location: [facility];  Service: General;  Laterality: N/A;  . IRRIGATION AND  DEBRIDEMENT BUTTOCKS N/A 03/23/2017   Procedure: IRRIGATION AND DEBRIDEMENT BUTTOCKS, SETON PLACEMENT;  Surgeon: [name], MD;  Location: [facility];  Service: General;  Laterality: N/A;  . LAPAROSCOPY  11/23/2011   Procedure: LAPAROSCOPY DIAGNOSTIC;  Surgeon: Edward Jolly, MD;  Location: [facility];  Service: General;  Laterality: N/A;     OB History    Gravida  1   Para  1   Term      Preterm  1   AB      Living  1     SAB      TAB      Ectopic      Multiple  0   Live Births  1            Home Medications    Prior to Admission medications   Medication Sig Start Date End Date Taking? Authorizing Provider  acetaminophen (TYLENOL) 500 MG tablet Take 1,000 mg by mouth daily as needed for moderate pain.     [provider]  Alum & Mag Hydroxide-Simeth (GI COCKTAIL) SUSP suspension Take 30 mLs by mouth 3 (three) times daily as needed for indigestion. Shake well. Patient not taking: Reported on 07/23/2017 07/13/17   Roxan Hockey, MD  calcium carbonate (TUMS - DOSED IN MG ELEMENTAL CALCIUM) 500 MG chewable tablet Chew 1 tablet (200 mg of elemental calcium total) by mouth 3 (three) times daily. 07/13/17   Roxan Hockey, MD  cholestyramine light (PREVALITE) 4 g packet Take 1 packet (4 g total) by mouth 3 (three) times daily. Patient taking differently: Take 4 g by mouth 2 (two) times daily as needed (diarrhea).  03/14/17   Lavina Hamman, MD  citalopram (CELEXA) 20 MG tablet Take 1 tablet (20 mg total) by mouth daily. 07/13/17   Roxan Hockey, MD  cyclobenzaprine (FLEXERIL) 10 MG tablet Take 1 tablet (10 mg total) by mouth 2 (two) times daily as needed for muscle spasms. Patient not taking: Reported on 07/23/2017 05/21/17   Delia Heady, PA-C  famotidine (PEPCID) 40 MG tablet Take 1 tablet (40 mg total) by mouth 2 (two) times daily. Patient not taking: Reported on 07/23/2017 06/21/17   Recardo Evangelist, PA-C  ferrous sulfate 325 (65 FE) MG tablet Take 1  tablet (325 mg total) by mouth 2 (two) times daily with a meal. 05/13/17   Henson, Vickie L, NP-C  furosemide (LASIX) 40 MG tablet Take 40 mg by mouth daily as needed for fluid.     [provider]  gabapentin (NEURONTIN) 300 MG capsule Take 1 capsule (300 mg total) by mouth 2 (two) times daily. 04/03/17   Henson, Vickie L, NP-C  glucose 4 GM chewable tablet Chew 1 tablet (4 g total) by mouth as needed for low blood sugar. Patient not taking: Reported on 07/23/2017 07/13/17   Roxan Hockey, MD  HYDROcodone-acetaminophen (NORCO/VICODIN) 5-325 MG tablet Take 1 tablet by mouth every 6 (six) hours as needed for moderate pain or severe pain. 07/13/17   Roxan Hockey, MD  insulin lispro (HUMALOG) 100 UNIT/ML injection Inject 0.02 mLs (2 Units total) into the skin 3 (three) times daily with meals. Patient taking differently: Inject 0-9 Units into the skin 3 (three) times daily with meals.  Sliding scale 04/09/17   Renato Shin, MD  lactobacillus (FLORANEX/LACTINEX) PACK Take 1 packet (1 g total) by mouth 3 (three) times daily with meals. Patient not taking: Reported on 07/23/2017 07/13/17   Roxan Hockey, MD  LANTUS 100 UNIT/ML injection Inject 0.12 mLs (12 Units total) into the skin daily. 07/13/17   Roxan Hockey, MD  lidocaine (LIDODERM) 5 % Place 1 patch onto the skin daily. Remove & Discard patch within 12 hours or as directed by MD Patient taking differently: Place 1 patch onto the skin daily as needed (pain). Remove & Discard patch within 12 hours or as directed by MD 05/21/17   Delia Heady, PA-C  lidocaine (XYLOCAINE) 2 % solution Use as directed 15 mLs in the mouth or throat every 6 (six) hours as needed (stomach pain). Patient not taking: Reported on 07/23/2017 05/31/17   Fatima Blank, MD  lipase/protease/amylase (CREON) 36000 UNITS CPEP capsule Take 1 capsule (36,000 Units total) by mouth 3 (three) times daily with meals. Patient not taking: Reported on 07/23/2017 03/14/17    Lavina Hamman, MD  metoCLOPramide (REGLAN) 5 MG tablet Take 5 mg by mouth every 6 (six) hours as needed for nausea or vomiting.     [provider]  Multiple Vitamin (MULTIVITAMIN WITH MINERALS) TABS tablet Take 1 tablet by mouth daily. Patient not taking: Reported on 07/23/2017 07/14/17   Roxan Hockey, MD  ondansetron (ZOFRAN ODT) 4 MG disintegrating tablet Take 1 tablet (4 mg total) by mouth every 8 (eight) hours as needed for nausea or vomiting. 07/09/17   Duffy Bruce, MD  ondansetron (ZOFRAN) 4 MG tablet Take 1 tablet (4 mg total) by mouth every 6 (six) hours as needed for nausea. Patient not taking: Reported on 07/23/2017 07/13/17   Roxan Hockey, MD  potassium chloride SA (K-DUR,KLOR-CON) 20 MEQ tablet Take 2 tablets (40 mEq total) by mouth daily for 3 days. 07/13/17 07/16/17  Roxan Hockey, MD  sucralfate (CARAFATE) 1 GM/10ML suspension Take 10 mLs (1 g total) by mouth 4 (four) times daily -  with meals and at bedtime. 06/17/17   Charlann Lange, PA-C    Family History Family History  Problem Relation Age of Onset  . Diabetes Mother   . Hypertension Father     Social History Social History   Tobacco Use  . Smoking status: Never Smoker  . Smokeless tobacco: Never Used  Substance Use Topics  . Alcohol use: No    Alcohol/week: 0.0 oz    Frequency: Never  . Drug use: No     Allergies   Other and Lactose intolerance (gi)   Review of Systems Review of Systems  All other systems reviewed and are negative.    Physical Exam Updated Vital Signs BP 109/88 (BP Location: Right Arm)   Pulse (!) 114   Temp 97.7 F (36.5 C) (Oral)   Resp 18   Ht 1.702 m (5' 7" )   Wt 59 kg (130 lb)   SpO2 100%   BMI 20.36 kg/m   Physical Exam  Constitutional: She is oriented to person, place, and time. She appears well-developed and well-nourished.  Non-toxic appearance. No distress.  HENT:  Head: Normocephalic and atraumatic.  Eyes: Pupils are equal, round, and  reactive to light. Conjunctivae, EOM and lids are normal.  Neck: Normal range of motion. Neck supple. No tracheal deviation present. No thyroid mass present.  Cardiovascular: Regular rhythm and normal heart sounds. Tachycardia present. Exam reveals no gallop.  No murmur heard. Pulmonary/Chest: Effort  normal and breath sounds normal. No stridor. No respiratory distress. She has no decreased breath sounds. She has no wheezes. She has no rhonchi. She has no rales.  Abdominal: Soft. Normal appearance and bowel sounds are normal. She exhibits no distension. There is tenderness in the epigastric area. There is no rigidity, no rebound, no guarding and no CVA tenderness.    Musculoskeletal: Normal range of motion. She exhibits no edema or tenderness.  Neurological: She is alert and oriented to person, place, and time. She has normal strength. No cranial nerve deficit or sensory deficit. GCS eye subscore is 4. GCS verbal subscore is 5. GCS motor subscore is 6.  Skin: Skin is warm and dry. No abrasion and no rash noted.  Psychiatric: She has a normal mood and affect. Her speech is normal and behavior is normal.  Nursing note and vitals reviewed.    ED Treatments / Results  Labs (all labs ordered are listed, but only abnormal results are displayed) Labs Reviewed  CBG MONITORING, ED - Abnormal; Notable for the following components:      Result Value   Glucose-Capillary 406 (*)    All other components within normal limits  BASIC METABOLIC PANEL  CBC  URINALYSIS, ROUTINE W REFLEX MICROSCOPIC  BLOOD GAS, VENOUS  CBG MONITORING, ED  I-STAT BETA HCG BLOOD, ED ([facility], [facility], [facility] ONLY)    EKG None  Radiology Dg Chest 2 View  Result Date: 07/23/2017 CLINICAL DATA:  Chest pain EXAM: CHEST - 2 VIEW COMPARISON:  Chest radiograph 07/10/2017 FINDINGS: The heart size and mediastinal contours are within normal limits. Both lungs are clear. The visualized skeletal structures are unremarkable. IMPRESSION: Normal  chest. Electronically Signed   By: Ulyses Jarred M.D.   On: 07/23/2017 17:41   Dg Lumbar Spine Complete  Result Date: 07/23/2017 CLINICAL DATA:  Per order- back pain Pt states she is having lower mid line back pain today. Pt states no injury to back. EXAM: LUMBAR SPINE - COMPLETE 4+ VIEW COMPARISON:  CT of the abdomen and pelvis on 06/19/2017 FINDINGS: There is no evidence of lumbar spine fracture. Alignment is normal. Intervertebral disc spaces are maintained. Surgical clips are present in the RIGHT UPPER QUADRANT the abdomen. IMPRESSION: Negative. Electronically Signed   By: Nolon Nations M.D.   On: 07/23/2017 19:27    Procedures Procedures (including critical care time)  Medications Ordered in ED Medications  sodium chloride 0.9 % bolus 2,000 mL (2,000 mLs Intravenous New Bag/Given 07/25/17 1252)  0.9 %  sodium chloride infusion (has no administration in time range)  metoCLOPramide (REGLAN) injection 5 mg (has no administration in time range)  diphenhydrAMINE (BENADRYL) injection 12.5 mg (has no administration in time range)  LORazepam (ATIVAN) injection 0.5 mg (has no administration in time range)  morphine 4 MG/ML injection 4 mg (has no administration in time range)     Initial Impression / Assessment and Plan / ED Course  I have reviewed the triage vital signs and the nursing notes.  Pertinent labs & imaging results that were available during my care of the patient were reviewed by me and considered in my medical decision making (see chart for details).     Patient given IV fluids here consisting of 2 L of saline.  Patient has no evidence of DKA here based on VBG as well as electrolytes.  Also treated with subcu insulin 8 units.  Blood sugar checked multiple times and at discharge it is now down to 280.  Patient feels  much better at this time.  Suspect some component of gastroparesis as cause of her symptoms.  Patient will follow up with her GI doctor.  Final Clinical  Impressions(s) / ED Diagnoses   Final diagnoses:  None    ED Discharge Orders    None       Lacretia Leigh, MD 07/25/17 1436

## 2017-07-25 NOTE — ED Triage Notes (Signed)
Patient c/o abdominal pain, vomiting, and hyperglycemia. Patient was seen 2 days ago for the same.

## 2017-07-26 ENCOUNTER — Encounter (HOSPITAL_COMMUNITY): Payer: Self-pay | Admitting: Emergency Medicine

## 2017-07-26 ENCOUNTER — Inpatient Hospital Stay (HOSPITAL_COMMUNITY)
Admission: EM | Admit: 2017-07-26 | Discharge: 2017-08-01 | DRG: 638 | Disposition: A | Discharge status: Home / Self Care | Payer: 59 | Attending: Internal Medicine | Admitting: Internal Medicine

## 2017-07-26 ENCOUNTER — Other Ambulatory Visit: Payer: Self-pay

## 2017-07-26 DIAGNOSIS — I10 Essential (primary) hypertension: Secondary | ICD-10-CM | POA: Diagnosis present

## 2017-07-26 DIAGNOSIS — G8929 Other chronic pain: Secondary | ICD-10-CM | POA: Diagnosis present

## 2017-07-26 DIAGNOSIS — R112 Nausea with vomiting, unspecified: Secondary | ICD-10-CM | POA: Diagnosis not present

## 2017-07-26 DIAGNOSIS — F1210 Cannabis abuse, uncomplicated: Secondary | ICD-10-CM | POA: Diagnosis present

## 2017-07-26 DIAGNOSIS — Z833 Family history of diabetes mellitus: Secondary | ICD-10-CM

## 2017-07-26 DIAGNOSIS — D6489 Other specified anemias: Secondary | ICD-10-CM | POA: Diagnosis present

## 2017-07-26 DIAGNOSIS — E1010 Type 1 diabetes mellitus with ketoacidosis without coma: Principal | ICD-10-CM | POA: Diagnosis present

## 2017-07-26 DIAGNOSIS — E1110 Type 2 diabetes mellitus with ketoacidosis without coma: Secondary | ICD-10-CM | POA: Diagnosis present

## 2017-07-26 DIAGNOSIS — L0231 Cutaneous abscess of buttock: Secondary | ICD-10-CM | POA: Diagnosis present

## 2017-07-26 DIAGNOSIS — K611 Rectal abscess: Secondary | ICD-10-CM | POA: Diagnosis present

## 2017-07-26 DIAGNOSIS — R1013 Epigastric pain: Secondary | ICD-10-CM

## 2017-07-26 DIAGNOSIS — E10649 Type 1 diabetes mellitus with hypoglycemia without coma: Secondary | ICD-10-CM | POA: Diagnosis present

## 2017-07-26 DIAGNOSIS — K3184 Gastroparesis: Secondary | ICD-10-CM | POA: Diagnosis present

## 2017-07-26 DIAGNOSIS — E1065 Type 1 diabetes mellitus with hyperglycemia: Secondary | ICD-10-CM | POA: Diagnosis not present

## 2017-07-26 DIAGNOSIS — F1110 Opioid abuse, uncomplicated: Secondary | ICD-10-CM | POA: Diagnosis present

## 2017-07-26 DIAGNOSIS — E876 Hypokalemia: Secondary | ICD-10-CM | POA: Diagnosis present

## 2017-07-26 DIAGNOSIS — Z794 Long term (current) use of insulin: Secondary | ICD-10-CM

## 2017-07-26 DIAGNOSIS — F329 Major depressive disorder, single episode, unspecified: Secondary | ICD-10-CM | POA: Diagnosis present

## 2017-07-26 DIAGNOSIS — M797 Fibromyalgia: Secondary | ICD-10-CM | POA: Diagnosis present

## 2017-07-26 DIAGNOSIS — Z9119 Patient's noncompliance with other medical treatment and regimen: Secondary | ICD-10-CM

## 2017-07-26 DIAGNOSIS — Z9114 Patient's other noncompliance with medication regimen: Secondary | ICD-10-CM

## 2017-07-26 DIAGNOSIS — E1043 Type 1 diabetes mellitus with diabetic autonomic (poly)neuropathy: Secondary | ICD-10-CM | POA: Diagnosis present

## 2017-07-26 DIAGNOSIS — M79601 Pain in right arm: Secondary | ICD-10-CM | POA: Diagnosis present

## 2017-07-26 LAB — I-STAT BETA HCG BLOOD, ED (MC, WL, AP ONLY): I-stat hCG, quantitative: <5 m[IU]/mL (ref <5)

## 2017-07-26 MED ORDER — HALOPERIDOL LACTATE 5 MG/ML IJ SOLN
5.0000 mg | Freq: Once | INTRAMUSCULAR | Status: AC
  Administered 2017-07-27: 5 mg via INTRAMUSCULAR
  Filled 2017-07-26: qty 1

## 2017-07-26 MED ORDER — HYDROMORPHONE HCL 1 MG/ML IJ SOLN
1.0000 mg | Freq: Once | INTRAMUSCULAR | Status: AC
  Administered 2017-07-27: 1 mg via INTRAMUSCULAR
  Filled 2017-07-26: qty 1

## 2017-07-26 MED ORDER — LACTATED RINGERS IV BOLUS: 2000.0000 mL | Freq: Once | INTRAVENOUS | Status: DC

## 2017-07-26 NOTE — ED Notes (Signed)
Charge RN asked to attempt US IV. 

## 2017-07-26 NOTE — ED Notes (Signed)
IV team at bedside 

## 2017-07-26 NOTE — ED Provider Notes (Signed)
[facility] DEPT Provider Note   CSN: [id number] Arrival date & time: 07/26/17  2018     History   Chief Complaint Chief Complaint  Patient presents with  . Abdominal Pain  . Emesis    HPI [name] is a 27 y.o. female.  HPI   27 year old female with abdominal pain and nausea/vomiting.  Pain is in the epigastrium.  She has a past history of gastroparesis but says that this feels worse than what she typically experiences.  She has been taking her home medications without any improvement.  No fevers or chills.  Some watery stools.  Blood sugars have been in the 3 and 400s.  She reports compliance with her medications.  Past Medical History:  Diagnosis Date  . Anxiety   . Diabetes type 1, uncontrolled ([place]) 11/14/2011   Since age 68  . Fibromyalgia   . Gastroparesis   . Hypertension   . Infection    UTI April 2016    Patient Active Problem List   Diagnosis Date Noted  . Normocytic anemia 07/10/2017  . SIRS (systemic inflammatory response syndrome) ([place]) 06/23/2017  . Hypokalemia 06/23/2017  . Normocytic normochromic anemia 06/23/2017  . GERD (gastroesophageal reflux disease) 06/23/2017  . Intractable nausea and vomiting 04/11/2017  . Nausea and vomiting 04/10/2017  . Essential hypertension   . Colitis   . Abscess and cellulitis of gluteal region 03/24/2017  . Cellulitis and abscess of buttock 03/23/2017  . Malnutrition of moderate degree 03/19/2017  . Anasarca 03/16/2017  . 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 Diarrhea 02/27/2017  . Lower GI bleed 02/27/2017  . DKA (diabetic ketoacidosis) ([place]) 02/26/2017  . Acute metabolic encephalopathy   . DKA, type 1 ([place]) 06/11/2013  . Orthostatic hypotension dysautonomic syndrome ([place]) 04/28/2013  . Body aches 04/28/2013  . Orthostatic hypotension 04/28/2013  . Diabetes mellitus type 1 ([place]) 04/28/2013  . Protein-calorie malnutrition, severe ([place])  04/28/2013  . Noncompliance with diabetes treatment 09/01/2012  . Dyspnea 02/28/2012  . Sinus tachycardia 02/28/2012  . Hyperglycemia without ketosis 02/28/2012  . Chest pain 12/04/2011  . Insulin dependent diabetes mellitus ([place]) 11/14/2011  . Hypertension associated with diabetes ([place]) 11/14/2011  . Gastroparesis 11/14/2011  . Hyponatremia 09/19/2011  . Chronic abdominal pain 09/18/2011  . Metabolic alkalosis 47/82/9562  . Diabetic hyperosmolar non-ketotic state ([place]) 09/18/2011  . DIAB W/UNSPEC COMP TYPE I [JUV TYPE] UNCNTRL 06/04/2010  . CONSTIPATION 01/01/2010  . RECTAL PAIN 01/01/2010    Past Surgical History:  Procedure Laterality Date  . ANKLE SURGERY    . CHOLECYSTECTOMY  11/15/2011   Procedure: LAPAROSCOPIC CHOLECYSTECTOMY WITH INTRAOPERATIVE CHOLANGIOGRAM;  Surgeon: [name], MD;  Location: [facility];  Service: General;  Laterality: N/A;  . COLONOSCOPY WITH PROPOFOL N/A 06/27/2017   Procedure: COLONOSCOPY WITH PROPOFOL;  Surgeon: Milus Banister, MD;  Location: [facility];  Service: Endoscopy;  Laterality: N/A;  . ESOPHAGOGASTRODUODENOSCOPY  12/03/2011   Procedure: ESOPHAGOGASTRODUODENOSCOPY (EGD);  Surgeon: Beryle Beams, MD;  Location: [facility];  Service: Endoscopy;  Laterality: N/A;  . FLEXIBLE SIGMOIDOSCOPY N/A 03/10/2017   Procedure: FLEXIBLE SIGMOIDOSCOPY;  Surgeon: Carol Ada, MD;  Location: [facility];  Service: Endoscopy;  Laterality: N/A;  . INCISION AND DRAINAGE PERIRECTAL ABSCESS N/A 03/01/2017   Procedure: IRRIGATION AND DEBRIDEMENT PERIRECTAL ABSCESS;  Surgeon: Alphonsa Overall, MD;  Location: [facility];  Service: General;  Laterality: N/A;  . IRRIGATION AND DEBRIDEMENT BUTTOCKS N/A 03/23/2017   Procedure: IRRIGATION AND DEBRIDEMENT  BUTTOCKS, SETON PLACEMENT;  Surgeon: [name], MD;  Location: [facility];  Service: General;  Laterality: N/A;  . LAPAROSCOPY  11/23/2011   Procedure: LAPAROSCOPY DIAGNOSTIC;  Surgeon: Edward Jolly, MD;  Location:  [facility];  Service: General;  Laterality: N/A;     OB History    Gravida  1   Para  1   Term      Preterm  1   AB      Living  1     SAB      TAB      Ectopic      Multiple  0   Live Births  1            Home Medications    Prior to Admission medications   Medication Sig Start Date End Date Taking? Authorizing Provider  acetaminophen (TYLENOL) 500 MG tablet Take 1,000 mg by mouth daily as needed for moderate pain.    Yes [provider]  calcium carbonate (TUMS - DOSED IN MG ELEMENTAL CALCIUM) 500 MG chewable tablet Chew 1 tablet (200 mg of elemental calcium total) by mouth 3 (three) times daily. 07/13/17  Yes Emokpae, Courage, MD  cholestyramine light (PREVALITE) 4 g packet Take 1 packet (4 g total) by mouth 3 (three) times daily. Patient taking differently: Take 4 g by mouth 2 (two) times daily as needed (diarrhea).  03/14/17  Yes Lavina Hamman, MD  citalopram (CELEXA) 20 MG tablet Take 1 tablet (20 mg total) by mouth daily. 07/13/17  Yes Roxan Hockey, MD  ferrous sulfate 325 (65 FE) MG tablet Take 1 tablet (325 mg total) by mouth 2 (two) times daily with a meal. 05/13/17  Yes Henson, Vickie L, NP-C  furosemide (LASIX) 40 MG tablet Take 40 mg by mouth daily as needed for fluid.    Yes [provider]  gabapentin (NEURONTIN) 300 MG capsule Take 1 capsule (300 mg total) by mouth 2 (two) times daily. 04/03/17  Yes Henson, Vickie L, NP-C  glucose 4 GM chewable tablet Chew 1 tablet (4 g total) by mouth as needed for low blood sugar. 07/13/17  Yes Emokpae, Courage, MD  insulin lispro (HUMALOG) 100 UNIT/ML injection Inject 0.02 mLs (2 Units total) into the skin 3 (three) times daily with meals. Patient taking differently: Inject 0-9 Units into the skin 3 (three) times daily with meals. Sliding scale 04/09/17  Yes Renato Shin, MD  LANTUS 100 UNIT/ML injection Inject 0.12 mLs (12 Units total) into the skin daily. 07/13/17  Yes Emokpae, Courage, MD  lidocaine  (LIDODERM) 5 % Place 1 patch onto the skin daily. Remove & Discard patch within 12 hours or as directed by MD Patient taking differently: Place 1 patch onto the skin daily as needed (pain). Remove & Discard patch within 12 hours or as directed by MD 05/21/17  Yes Khatri, Hina, PA-C  lidocaine (XYLOCAINE) 2 % solution Use as directed 15 mLs in the mouth or throat every 6 (six) hours as needed (stomach pain). 05/31/17  Yes Cardama, Grayce Sessions, MD  lipase/protease/amylase (CREON) 36000 UNITS CPEP capsule Take 1 capsule (36,000 Units total) by mouth 3 (three) times daily with meals. 03/14/17  Yes Lavina Hamman, MD  metoCLOPramide (REGLAN) 5 MG tablet Take 5 mg by mouth every 6 (six) hours as needed for nausea or vomiting.    Yes [provider]  ondansetron (ZOFRAN ODT) 4 MG disintegrating tablet Take 1 tablet (4 mg total) by mouth every 8 (eight)  hours as needed for nausea or vomiting. 07/09/17  Yes Duffy Bruce, MD  sucralfate (CARAFATE) 1 GM/10ML suspension Take 10 mLs (1 g total) by mouth 4 (four) times daily -  with meals and at bedtime. 06/17/17  Yes Upstill, Nehemiah Settle, PA-C  Alum & Mag Hydroxide-[place] (GI COCKTAIL) SUSP suspension Take 30 mLs by mouth 3 (three) times daily as needed for indigestion. Shake well. Patient not taking: Reported on 07/23/2017 07/13/17   Roxan Hockey, MD  cyclobenzaprine (FLEXERIL) 10 MG tablet Take 1 tablet (10 mg total) by mouth 2 (two) times daily as needed for muscle spasms. Patient not taking: Reported on 07/25/2017 05/21/17   Delia Heady, PA-C  famotidine (PEPCID) 40 MG tablet Take 1 tablet (40 mg total) by mouth 2 (two) times daily. Patient not taking: Reported on 07/23/2017 06/21/17   Recardo Evangelist, PA-C  HYDROcodone-acetaminophen (NORCO/VICODIN) 5-325 MG tablet Take 1 tablet by mouth every 6 (six) hours as needed for moderate pain or severe pain. Patient not taking: Reported on 07/25/2017 07/13/17   Roxan Hockey, MD  lactobacillus  (FLORANEX/LACTINEX) PACK Take 1 packet (1 g total) by mouth 3 (three) times daily with meals. 07/13/17   Roxan Hockey, MD  Multiple Vitamin (MULTIVITAMIN WITH MINERALS) TABS tablet Take 1 tablet by mouth daily. Patient not taking: Reported on 07/23/2017 07/14/17   Roxan Hockey, MD  ondansetron (ZOFRAN) 4 MG tablet Take 1 tablet (4 mg total) by mouth every 6 (six) hours as needed for nausea. Patient not taking: Reported on 07/23/2017 07/13/17   Roxan Hockey, MD    Family History Family History  Problem Relation Age of Onset  . Diabetes Mother   . Hypertension Father     Social History Social History   Tobacco Use  . Smoking status: Never Smoker  . Smokeless tobacco: Never Used  Substance Use Topics  . Alcohol use: No    Alcohol/week: 0.0 oz    Frequency: Never  . Drug use: No     Allergies   Other and Lactose intolerance (gi)   Review of Systems Review of Systems  All systems reviewed and negative, other than as noted in HPI.  Physical Exam Updated Vital Signs BP (!) 116/97 (BP Location: Left Arm)   Pulse 88   Temp 97.8 F (36.6 C) (Oral)   Resp 20   SpO2 98%   Physical Exam  Constitutional: No distress.  Laying in bed.  Appears uncomfortable.  Chronically ill-appearing.  HENT:  Head: Normocephalic and atraumatic.  Eyes: Conjunctivae are normal. Right eye exhibits no discharge. Left eye exhibits no discharge.  Neck: Neck supple.  Cardiovascular: Normal rate, regular rhythm and normal heart sounds. Exam reveals no gallop and no friction rub.  No murmur heard. Pulmonary/Chest: Effort normal and breath sounds normal. No respiratory distress.  Abdominal: Soft. She exhibits no distension. There is no tenderness.  Abdomen is soft.  Mild tenderness in the epigastrium without rebound or guarding.  Musculoskeletal: She exhibits no edema or tenderness.  Neurological: She is alert.  Skin: Skin is warm and dry.  Psychiatric: She has a normal mood and affect. Her  behavior is normal. Thought content normal.  Nursing note and vitals reviewed.    ED Treatments / Results  Labs (all labs ordered are listed, but only abnormal results are displayed) Labs Reviewed  COMPREHENSIVE METABOLIC PANEL - Abnormal; Notable for the following components:      Result Value   CO2 19 (*)    Glucose, Bld 378 (*)  Total Protein 9.0 (*)    Anion gap 21 (*)    All other components within normal limits  LIPASE, BLOOD  CBC WITH DIFFERENTIAL/PLATELET  URINALYSIS, ROUTINE W REFLEX MICROSCOPIC  CBC  DIFFERENTIAL  I-STAT BETA HCG BLOOD, ED ([facility], [facility], [facility] ONLY)    EKG None  Radiology No results found.  Procedures Procedures (including critical care time)  Medications Ordered in ED Medications  lactated ringers bolus 2,000 mL (has no administration in time range)  haloperidol lactate (HALDOL) injection 5 mg (has no administration in time range)  HYDROmorphone (DILAUDID) injection 1 mg (has no administration in time range)     Initial Impression / Assessment and Plan / ED Course  I have reviewed the triage vital signs and the nursing notes.  Pertinent labs & imaging results that were available during my care of the patient were reviewed by me and considered in my medical decision making (see chart for details).     27 year old female with abdominal pain and nausea/vomiting.  Poorly controlled diabetic.  Frequent evaluations for similar symptoms.  We will treat her symptomatically.  She needs labs to assess to make sure she is done DKA.  I doubt acute surgical process.  She complained of abdominal pain.  She has fairly mild tenderness on exam.  Final Clinical Impressions(s) / ED Diagnoses   Final diagnoses:  Epigastric pain  Nausea and vomiting, intractability of vomiting not specified, unspecified vomiting type    ED Discharge Orders    None       Virgel Manifold, MD 07/27/17 [phone]

## 2017-07-26 NOTE — ED Notes (Signed)
Unsuccessful IV attempt x2 by Consulting civil engineer. IV team consult placed.

## 2017-07-26 NOTE — ED Notes (Signed)
Patient vomiting on the floor. Provided with additional emesis bag.

## 2017-07-26 NOTE — ED Triage Notes (Signed)
Pt is c/o abd pain with vomiting  Pt was seen here on Friday for same  Pt states she is unable to hold anything down

## 2017-07-27 ENCOUNTER — Encounter (HOSPITAL_COMMUNITY): Payer: Self-pay | Admitting: Internal Medicine

## 2017-07-27 DIAGNOSIS — E876 Hypokalemia: Secondary | ICD-10-CM | POA: Diagnosis not present

## 2017-07-27 DIAGNOSIS — M79601 Pain in right arm: Secondary | ICD-10-CM | POA: Diagnosis present

## 2017-07-27 DIAGNOSIS — G43A1 Cyclical vomiting, intractable: Secondary | ICD-10-CM | POA: Diagnosis not present

## 2017-07-27 DIAGNOSIS — E1043 Type 1 diabetes mellitus with diabetic autonomic (poly)neuropathy: Secondary | ICD-10-CM | POA: Diagnosis present

## 2017-07-27 DIAGNOSIS — R1013 Epigastric pain: Secondary | ICD-10-CM | POA: Diagnosis present

## 2017-07-27 DIAGNOSIS — R112 Nausea with vomiting, unspecified: Secondary | ICD-10-CM

## 2017-07-27 DIAGNOSIS — Z833 Family history of diabetes mellitus: Secondary | ICD-10-CM | POA: Diagnosis not present

## 2017-07-27 DIAGNOSIS — K611 Rectal abscess: Secondary | ICD-10-CM | POA: Diagnosis not present

## 2017-07-27 DIAGNOSIS — F1110 Opioid abuse, uncomplicated: Secondary | ICD-10-CM | POA: Diagnosis present

## 2017-07-27 DIAGNOSIS — D649 Anemia, unspecified: Secondary | ICD-10-CM | POA: Diagnosis not present

## 2017-07-27 DIAGNOSIS — M797 Fibromyalgia: Secondary | ICD-10-CM | POA: Diagnosis present

## 2017-07-27 DIAGNOSIS — F1210 Cannabis abuse, uncomplicated: Secondary | ICD-10-CM | POA: Diagnosis present

## 2017-07-27 DIAGNOSIS — L0231 Cutaneous abscess of buttock: Secondary | ICD-10-CM | POA: Diagnosis not present

## 2017-07-27 DIAGNOSIS — Z9114 Patient's other noncompliance with medication regimen: Secondary | ICD-10-CM | POA: Diagnosis not present

## 2017-07-27 DIAGNOSIS — E1010 Type 1 diabetes mellitus with ketoacidosis without coma: Secondary | ICD-10-CM | POA: Diagnosis not present

## 2017-07-27 DIAGNOSIS — E1110 Type 2 diabetes mellitus with ketoacidosis without coma: Secondary | ICD-10-CM | POA: Diagnosis present

## 2017-07-27 DIAGNOSIS — I10 Essential (primary) hypertension: Secondary | ICD-10-CM | POA: Diagnosis present

## 2017-07-27 DIAGNOSIS — F329 Major depressive disorder, single episode, unspecified: Secondary | ICD-10-CM | POA: Diagnosis not present

## 2017-07-27 DIAGNOSIS — Z9119 Patient's noncompliance with other medical treatment and regimen: Secondary | ICD-10-CM | POA: Diagnosis not present

## 2017-07-27 DIAGNOSIS — E10649 Type 1 diabetes mellitus with hypoglycemia without coma: Secondary | ICD-10-CM | POA: Diagnosis present

## 2017-07-27 DIAGNOSIS — Z794 Long term (current) use of insulin: Secondary | ICD-10-CM | POA: Diagnosis not present

## 2017-07-27 DIAGNOSIS — G8929 Other chronic pain: Secondary | ICD-10-CM | POA: Diagnosis present

## 2017-07-27 DIAGNOSIS — E0810 Diabetes mellitus due to underlying condition with ketoacidosis without coma: Secondary | ICD-10-CM

## 2017-07-27 DIAGNOSIS — D6489 Other specified anemias: Secondary | ICD-10-CM | POA: Diagnosis present

## 2017-07-27 DIAGNOSIS — K3184 Gastroparesis: Secondary | ICD-10-CM | POA: Diagnosis not present

## 2017-07-27 LAB — URINALYSIS, ROUTINE W REFLEX MICROSCOPIC
Bilirubin Urine: NEGATIVE
Glucose, UA: >=500 mg/dL — AB
Ketones, ur: 80 mg/dL — AB
Leukocytes, UA: NEGATIVE
Nitrite: NEGATIVE
Protein, ur: 100 mg/dL — AB
Specific Gravity, Urine: 1.023 (ref 1.005–1.030)
pH: 6 (ref 5.0–8.0)

## 2017-07-27 LAB — BLOOD GAS, VENOUS
Acid-base deficit: 4.3 mmol/L — ABNORMAL HIGH (ref 0.0–2.0)
Bicarbonate: 21.1 mmol/L (ref 20.0–28.0)
FIO2: 21
O2 Saturation: 77.2 %
Patient temperature: 98.6
pCO2, Ven: 42.1 mmHg — ABNORMAL LOW (ref 44.0–60.0)
pH, Ven: 7.321 (ref 7.250–7.430)
pO2, Ven: 44.3 mmHg (ref 32.0–45.0)

## 2017-07-27 LAB — BASIC METABOLIC PANEL
Anion gap: 11 (ref 5–15)
Anion gap: 8 (ref 5–15)
BUN: 14 mg/dL (ref 6–20)
BUN: 15 mg/dL (ref 6–20)
CO2: 23 mmol/L (ref 22–32)
CO2: 25 mmol/L (ref 22–32)
Calcium: 8.4 mg/dL — ABNORMAL LOW (ref 8.9–10.3)
Calcium: 8.6 mg/dL — ABNORMAL LOW (ref 8.9–10.3)
Chloride: 109 mmol/L (ref 101–111)
Chloride: 111 mmol/L (ref 101–111)
Creatinine, Ser: 0.53 mg/dL (ref 0.44–1.00)
Creatinine, Ser: 0.44 mg/dL (ref 0.44–1.00)
GFR calc Af Amer: >60 mL/min (ref >60)
GFR calc Af Amer: >60 mL/min (ref >60)
GFR calc non Af Amer: >60 mL/min (ref >60)
GFR calc non Af Amer: >60 mL/min (ref >60)
Glucose, Bld: 238 mg/dL — ABNORMAL HIGH (ref 65–99)
Glucose, Bld: 131 mg/dL — ABNORMAL HIGH (ref 65–99)
Potassium: 3.2 mmol/L — ABNORMAL LOW (ref 3.5–5.1)
Potassium: 3.3 mmol/L — ABNORMAL LOW (ref 3.5–5.1)
Sodium: 143 mmol/L (ref 135–145)
Sodium: 144 mmol/L (ref 135–145)

## 2017-07-27 LAB — RAPID URINE DRUG SCREEN, HOSP PERFORMED
Amphetamines: NOT DETECTED
Barbiturates: NOT DETECTED
Benzodiazepines: NOT DETECTED
Cocaine: NOT DETECTED
Opiates: POSITIVE — AB
Tetrahydrocannabinol: POSITIVE — AB

## 2017-07-27 LAB — GLUCOSE, CAPILLARY
Glucose-Capillary: 377 mg/dL — ABNORMAL HIGH (ref 65–99)
Glucose-Capillary: 151 mg/dL — ABNORMAL HIGH (ref 65–99)

## 2017-07-27 LAB — COMPREHENSIVE METABOLIC PANEL
ALT: 47 U/L (ref 14–54)
AST: 31 U/L (ref 15–41)
Albumin: 4.4 g/dL (ref 3.5–5.0)
Alkaline Phosphatase: 108 U/L (ref 38–126)
Anion gap: 21 — ABNORMAL HIGH (ref 5–15)
BUN: 12 mg/dL (ref 6–20)
CO2: 19 mmol/L — ABNORMAL LOW (ref 22–32)
Calcium: 9.9 mg/dL (ref 8.9–10.3)
Chloride: 101 mmol/L (ref 101–111)
Creatinine, Ser: 0.66 mg/dL (ref 0.44–1.00)
GFR calc Af Amer: >60 mL/min (ref >60)
GFR calc non Af Amer: >60 mL/min (ref >60)
Glucose, Bld: 378 mg/dL — ABNORMAL HIGH (ref 65–99)
Potassium: 3.8 mmol/L (ref 3.5–5.1)
Sodium: 141 mmol/L (ref 135–145)
Total Bilirubin: 1.2 mg/dL (ref 0.3–1.2)
Total Protein: 9 g/dL — ABNORMAL HIGH (ref 6.5–8.1)

## 2017-07-27 LAB — CBC
HCT: 34.8 % — ABNORMAL LOW (ref 36.0–46.0)
Hemoglobin: 12.2 g/dL (ref 12.0–15.0)
MCH: 29.8 pg (ref 26.0–34.0)
MCHC: 35.1 g/dL (ref 30.0–36.0)
MCV: 84.9 fL (ref 78.0–100.0)
Platelets: 220 10*3/uL (ref 150–400)
RBC: 4.1 MIL/uL (ref 3.87–5.11)
RDW: 12.6 % (ref 11.5–15.5)
WBC: 4.5 10*3/uL (ref 4.0–10.5)

## 2017-07-27 LAB — CBG MONITORING, ED
Glucose-Capillary: 382 mg/dL — ABNORMAL HIGH (ref 65–99)
Glucose-Capillary: 278 mg/dL — ABNORMAL HIGH (ref 65–99)
Glucose-Capillary: 232 mg/dL — ABNORMAL HIGH (ref 65–99)
Glucose-Capillary: 206 mg/dL — ABNORMAL HIGH (ref 65–99)
Glucose-Capillary: 149 mg/dL — ABNORMAL HIGH (ref 65–99)
Glucose-Capillary: 134 mg/dL — ABNORMAL HIGH (ref 65–99)
Glucose-Capillary: 117 mg/dL — ABNORMAL HIGH (ref 65–99)
Glucose-Capillary: 127 mg/dL — ABNORMAL HIGH (ref 65–99)
Glucose-Capillary: 164 mg/dL — ABNORMAL HIGH (ref 65–99)

## 2017-07-27 LAB — DIFFERENTIAL
Basophils Absolute: 0 10*3/uL (ref 0.0–0.1)
Basophils Relative: 0 %
Eosinophils Absolute: 0 10*3/uL (ref 0.0–0.7)
Eosinophils Relative: 0 %
Lymphocytes Relative: 14 %
Lymphs Abs: 0.6 10*3/uL — ABNORMAL LOW (ref 0.7–4.0)
Monocytes Absolute: 0.1 10*3/uL (ref 0.1–1.0)
Monocytes Relative: 3 %
Neutro Abs: 3.8 10*3/uL (ref 1.7–7.7)
Neutrophils Relative %: 83 %

## 2017-07-27 LAB — TROPONIN I: Troponin I: <0.03 ng/mL (ref <0.03)

## 2017-07-27 LAB — LIPASE, BLOOD: Lipase: 19 U/L (ref 11–51)

## 2017-07-27 MED ORDER — SODIUM CHLORIDE 0.9 % IV SOLN
INTRAVENOUS | Status: DC
  Filled 2017-07-27: qty 1

## 2017-07-27 MED ORDER — HYDRALAZINE HCL 20 MG/ML IJ SOLN
10.0000 mg | INTRAMUSCULAR | Status: DC | PRN
  Administered 2017-07-27 – 2017-07-28 (×2): 10 mg via INTRAVENOUS
  Filled 2017-07-27 (×3): qty 1

## 2017-07-27 MED ORDER — SODIUM CHLORIDE 0.9 % IV SOLN: INTRAVENOUS | Status: DC

## 2017-07-27 MED ORDER — LISINOPRIL 5 MG PO TABS
5.0000 mg | ORAL_TABLET | Freq: Every day | ORAL | Status: DC
  Administered 2017-07-27 – 2017-08-01 (×4): 5 mg via ORAL
  Filled 2017-07-27 (×6): qty 1

## 2017-07-27 MED ORDER — HYDROMORPHONE HCL 1 MG/ML IJ SOLN
0.5000 mg | Freq: Four times a day (QID) | INTRAMUSCULAR | Status: DC | PRN
  Administered 2017-07-27: 0.5 mg via INTRAVENOUS
  Filled 2017-07-27: qty 1

## 2017-07-27 MED ORDER — GABAPENTIN 300 MG PO CAPS
300.0000 mg | ORAL_CAPSULE | Freq: Two times a day (BID) | ORAL | Status: DC
  Administered 2017-07-27: 300 mg via ORAL
  Filled 2017-07-27: qty 1

## 2017-07-27 MED ORDER — INSULIN ASPART 100 UNIT/ML ~~LOC~~ SOLN
0.0000 [IU] | Freq: Three times a day (TID) | SUBCUTANEOUS | Status: DC
  Administered 2017-07-27: 9 [IU] via SUBCUTANEOUS
  Administered 2017-07-28: 7 [IU] via SUBCUTANEOUS
  Administered 2017-07-28: 3 [IU] via SUBCUTANEOUS
  Administered 2017-07-31 (×2): 1 [IU] via SUBCUTANEOUS
  Administered 2017-08-01: 5 [IU] via SUBCUTANEOUS
  Administered 2017-08-01: 3 [IU] via SUBCUTANEOUS

## 2017-07-27 MED ORDER — METOCLOPRAMIDE HCL 5 MG/ML IJ SOLN
5.0000 mg | Freq: Four times a day (QID) | INTRAMUSCULAR | Status: DC | PRN
  Administered 2017-07-27: 5 mg via INTRAVENOUS
  Filled 2017-07-27: qty 2

## 2017-07-27 MED ORDER — GI COCKTAIL ~~LOC~~
30.0000 mL | Freq: Two times a day (BID) | ORAL | Status: DC | PRN
  Administered 2017-07-27 – 2017-07-29 (×3): 30 mL via ORAL
  Filled 2017-07-27 (×3): qty 30

## 2017-07-27 MED ORDER — DIPHENHYDRAMINE HCL 50 MG/ML IJ SOLN
25.0000 mg | Freq: Once | INTRAMUSCULAR | Status: AC
Start: 2017-07-27 — End: 2017-07-27
  Administered 2017-07-27: 25 mg via INTRAVENOUS
  Filled 2017-07-27: qty 1

## 2017-07-27 MED ORDER — METOPROLOL TARTRATE 25 MG PO TABS
25.0000 mg | ORAL_TABLET | Freq: Two times a day (BID) | ORAL | Status: DC
  Administered 2017-07-27 – 2017-07-28 (×2): 25 mg via ORAL
  Filled 2017-07-27 (×2): qty 1

## 2017-07-27 MED ORDER — ZOLPIDEM TARTRATE 5 MG PO TABS
5.0000 mg | ORAL_TABLET | Freq: Every evening | ORAL | Status: DC | PRN
  Administered 2017-07-27 – 2017-07-31 (×5): 5 mg via ORAL
  Filled 2017-07-27 (×5): qty 1

## 2017-07-27 MED ORDER — METOCLOPRAMIDE HCL 10 MG PO TABS: 5.0000 mg | ORAL_TABLET | Freq: Three times a day (TID) | ORAL | Status: DC

## 2017-07-27 MED ORDER — CHOLESTYRAMINE LIGHT 4 G PO PACK: 4.0000 g | PACK | Freq: Three times a day (TID) | ORAL | Status: DC

## 2017-07-27 MED ORDER — METOCLOPRAMIDE HCL 5 MG PO TABS
5.0000 mg | ORAL_TABLET | Freq: Three times a day (TID) | ORAL | Status: DC
  Administered 2017-07-27 – 2017-08-01 (×10): 5 mg via ORAL
  Filled 2017-07-27 (×12): qty 1

## 2017-07-27 MED ORDER — INSULIN GLARGINE 100 UNIT/ML ~~LOC~~ SOLN
12.0000 [IU] | Freq: Every day | SUBCUTANEOUS | Status: DC
  Administered 2017-07-27 – 2017-07-28 (×2): 12 [IU] via SUBCUTANEOUS
  Filled 2017-07-27 (×2): qty 0.12

## 2017-07-27 MED ORDER — SODIUM CHLORIDE 0.9 % IV SOLN
INTRAVENOUS | Status: DC
  Administered 2017-07-27: 3.2 [IU]/h via INTRAVENOUS
  Filled 2017-07-27: qty 1

## 2017-07-27 MED ORDER — CHOLESTYRAMINE LIGHT 4 G PO PACK
4.0000 g | PACK | ORAL | Status: DC
  Administered 2017-07-27: 4 g via ORAL
  Filled 2017-07-27 (×16): qty 1

## 2017-07-27 MED ORDER — SODIUM CHLORIDE 0.9 % IV BOLUS
1000.0000 mL | Freq: Once | INTRAVENOUS | Status: AC
  Administered 2017-07-27: 1000 mL via INTRAVENOUS

## 2017-07-27 MED ORDER — LABETALOL HCL 5 MG/ML IV SOLN
5.0000 mg | Freq: Once | INTRAVENOUS | Status: AC
  Administered 2017-07-27: 5 mg via INTRAVENOUS
  Filled 2017-07-27: qty 4

## 2017-07-27 MED ORDER — ONDANSETRON HCL 4 MG/2ML IJ SOLN
4.0000 mg | Freq: Four times a day (QID) | INTRAMUSCULAR | Status: DC | PRN
  Administered 2017-07-27 – 2017-07-29 (×5): 4 mg via INTRAVENOUS
  Filled 2017-07-27 (×5): qty 2

## 2017-07-27 MED ORDER — DEXTROSE-NACL 5-0.45 % IV SOLN
INTRAVENOUS | Status: DC
  Administered 2017-07-27: 04:00:00 via INTRAVENOUS

## 2017-07-27 MED ORDER — INSULIN ASPART 100 UNIT/ML ~~LOC~~ SOLN
3.0000 [IU] | Freq: Three times a day (TID) | SUBCUTANEOUS | Status: DC
  Administered 2017-07-27 – 2017-07-31 (×2): 3 [IU] via SUBCUTANEOUS

## 2017-07-27 MED ORDER — CITALOPRAM HYDROBROMIDE 20 MG PO TABS: 20.0000 mg | ORAL_TABLET | Freq: Every day | ORAL | Status: DC

## 2017-07-27 MED ORDER — POTASSIUM CHLORIDE CRYS ER 20 MEQ PO TBCR
40.0000 meq | EXTENDED_RELEASE_TABLET | Freq: Once | ORAL | Status: AC
  Administered 2017-07-27: 40 meq via ORAL
  Filled 2017-07-27: qty 2

## 2017-07-27 MED ORDER — DEXTROSE-NACL 5-0.45 % IV SOLN: INTRAVENOUS | Status: DC

## 2017-07-27 MED ORDER — FERROUS SULFATE 325 (65 FE) MG PO TABS
325.0000 mg | ORAL_TABLET | Freq: Two times a day (BID) | ORAL | Status: DC
  Administered 2017-07-27 – 2017-08-01 (×4): 325 mg via ORAL
  Filled 2017-07-27 (×7): qty 1

## 2017-07-27 MED ORDER — KETOROLAC TROMETHAMINE 30 MG/ML IJ SOLN
INTRAMUSCULAR | Status: AC
  Administered 2017-07-27: 30 mg via INTRAVENOUS
  Filled 2017-07-27: qty 1

## 2017-07-27 MED ORDER — METOCLOPRAMIDE HCL 5 MG/ML IJ SOLN
5.0000 mg | Freq: Once | INTRAMUSCULAR | Status: AC
  Administered 2017-07-27: 5 mg via INTRAVENOUS
  Filled 2017-07-27: qty 2

## 2017-07-27 MED ORDER — KETOROLAC TROMETHAMINE 30 MG/ML IJ SOLN
30.0000 mg | Freq: Four times a day (QID) | INTRAMUSCULAR | Status: AC
  Administered 2017-07-27 – 2017-07-31 (×19): 30 mg via INTRAVENOUS
  Filled 2017-07-27 (×19): qty 1

## 2017-07-27 MED ORDER — HYDROCODONE-ACETAMINOPHEN 5-325 MG PO TABS
1.0000 | ORAL_TABLET | Freq: Four times a day (QID) | ORAL | Status: DC | PRN
  Administered 2017-07-27 – 2017-08-01 (×6): 1 via ORAL
  Filled 2017-07-27 (×7): qty 1

## 2017-07-27 NOTE — Progress Notes (Signed)
Inpatient Diabetes Program Recommendations  AACE/ADA: New Consensus Statement on Inpatient Glycemic Control (2015)  Target Ranges:  Prepandial:   less than 140 mg/dL      Peak postprandial:   less than 180 mg/dL (1-2 hours)      Critically ill patients:  140 - 180 mg/dL   Lab Results  Component Value Date   GLUCAP 164 (H) 07/27/2017   HGBA1C 5.7 (H) 04/10/2017    Review of Glycemic Control  Diabetes history: DM1 Outpatient Diabetes medications: Lantus 12 units (decreased from 18 units on 07/13/17) + Humalog 2 units tid meal coverage Current orders for Inpatient glycemic control: Lantus 12 units + Novolog 3 units tid meal coverage + Novolog sensitive correction tid  Inpatient Diabetes Program Recommendations:   Please note patient's Lantus insulin was decreased on 07/13/17 to 12 units. -A1c to determine current prehospital glycemic control  Thank you, Billy Fischer. Celese Banner, RN, MSN, CDE  Diabetes Coordinator Inpatient Glycemic Control Team Team Pager [phone] (8am-5pm) 07/27/2017 1:18 PM

## 2017-07-27 NOTE — Progress Notes (Signed)
[facility] PROGRESS NOTE  Patient: [name] DKC:[id number]   PCP: Patient, No Pcp Per DOB: [birth year]   DOA: 07/26/2017   DOS: 07/27/2017    Subjective: Patient denies any nausea or vomiting.  Continues to have abdominal pain.  No diarrhea here but reported that she had every half an hour bowel movement at home yesterday  Objective:  Vitals:   07/27/17 1533 07/27/17 1714  BP: (!) 156/104 (!) 141/80  Pulse: (!) 124   Resp: (!) 22   Temp: 98 F (36.7 C)   SpO2: 100%     Clear to auscultation. S1-S2 present. Abdominal tenderness absent. Bowel sounds present. No swelling in the leg.  Assessment and plan: 1 DKA. Currently anion gap closed, will transition her to her home insulin regimen of 12 units Lantus.  Will add 3 units of AC coverage as well as sliding scale insulin.  2.  Abdominal pain.  Epigastric, burning in nature. Likely from gastroparesis. Continue PPI, continue current regimen. Vision for further work-up since patient has recurrent admissions with chronic abdominal pain. No indication for IV narcotics.  3.  Uncontrolled hypertension. Likely anxiety versus withdrawal. Use as needed hydralazine, x1 labetalol as well as scheduled Lopressor.  Given history of hypertension and diabetes I will add lisinopril.  Author: Berle Mull, MD [facility] Pager: [phone] 07/27/2017 6:45 PM   If 7PM-7AM, please contact night-coverage at [email or web address], password [email or web address]

## 2017-07-27 NOTE — ED Notes (Signed)
No respiratory or acute distress noted alert and oriented x 3 no reaction to medication noted visitor at bedside call light in reach. 

## 2017-07-27 NOTE — Consult Note (Signed)
WOC Nurse wound consult note Reason for Consult: Consult requested for buttock wounds.  Pt had I&D to these sites performed by the surgical team several months ago and has been ordered to use iodoform packing.  They have greatly improved and are almost closed. Wound type: 2 full thickness wounds to right buttock; .2X.2X.2cm and .3X.3X.2cm Wound bed: dark red and moist, dry scabbed areas surrounding.  Induration to skin around the wounds. Drainage (amount, consistency, odor) Small amt tan drainage, no odor. Continue plan of care with Iodoform, protect from further injury with foam dressing.  Discussed plan of care with patient. Please re-consult if further assistance is needed.  Thank-you,  Cammie Mcgee MSN, RN, CWOCN, [place], CNS [phone]

## 2017-07-27 NOTE — ED Notes (Signed)
11 failed IV access attempts by IV team, attending EDP and assigned nurses. Will administer IM meds and send available blood sample for the ordered labs.

## 2017-07-27 NOTE — ED Notes (Signed)
Pt BP elevated. Paged  Dr. Allena Katz for management. RN [place] from [facility] declined to take pt until BP improved. MD and Charge RN made aware, awaiting further instruction

## 2017-07-27 NOTE — ED Notes (Signed)
Pt still nauseas after being given reglan, MD notified.

## 2017-07-27 NOTE — ED Notes (Signed)
Resting in bed with eyes closed no reaction to medication noted call light in reach visitor at bedside no respiratory or acute distress noted.

## 2017-07-27 NOTE — H&P (Addendum)
History and Physical    [name] [id number]:[id number] DOB: 1990/11/15 DOA: 07/26/2017  PCP: Patient, No Pcp Per  Patient coming from: Home.  Chief Complaint: Nausea vomiting and diarrhea.  HPI: [name] is a 27 y.o. female with history of diabetes mellitus type 1 with history of gastroparesis and depression who was recently admitted 3 weeks ago for right buttock abscess and nausea vomiting presents to the ER with complaints of persistent nausea vomiting and diarrhea last 3 days.  Also has been having some epigastric discomfort.  Denies any blood in the vomitus or diarrhea.  Patient was on antibiotics until last week.  Patient states she has been compliant with her Lantus insulin but was not on any sliding scale coverage due to having persistent nausea.  During the stay in March 2019 last month patient had colonoscopy which was completely unremarkable.  Patient during the last December stay and was placed on cholestyramine and Creon for severe malnutrition and diarrhea.  ED Course: In the ER patient had blood sugar of 378 with anion gap of 21.  Patient complained of persistent nausea and epigastric discomfort.  Abdomen on exam appeared benign.  Patient's labs showed normal LFTs and lipase.  Review of Systems: As per HPI, rest all negative.   Past Medical History:  Diagnosis Date  . Anxiety   . Diabetes type 1, uncontrolled ([place]) 11/14/2011   Since age 83  . Fibromyalgia   . Gastroparesis   . Hypertension   . Infection    UTI April 2016    Past Surgical History:  Procedure Laterality Date  . ANKLE SURGERY    . CHOLECYSTECTOMY  11/15/2011   Procedure: LAPAROSCOPIC CHOLECYSTECTOMY WITH INTRAOPERATIVE CHOLANGIOGRAM;  Surgeon: [name], MD;  Location: [facility];  Service: General;  Laterality: N/A;  . COLONOSCOPY WITH PROPOFOL N/A 06/27/2017   Procedure: COLONOSCOPY WITH PROPOFOL;  Surgeon: Milus Banister, MD;  Location: [facility];  Service: Endoscopy;  Laterality: N/A;    . ESOPHAGOGASTRODUODENOSCOPY  12/03/2011   Procedure: ESOPHAGOGASTRODUODENOSCOPY (EGD);  Surgeon: Beryle Beams, MD;  Location: [facility];  Service: Endoscopy;  Laterality: N/A;  . FLEXIBLE SIGMOIDOSCOPY N/A 03/10/2017   Procedure: FLEXIBLE SIGMOIDOSCOPY;  Surgeon: Carol Ada, MD;  Location: [facility];  Service: Endoscopy;  Laterality: N/A;  . INCISION AND DRAINAGE PERIRECTAL ABSCESS N/A 03/01/2017   Procedure: IRRIGATION AND DEBRIDEMENT PERIRECTAL ABSCESS;  Surgeon: Alphonsa Overall, MD;  Location: [facility];  Service: General;  Laterality: N/A;  . IRRIGATION AND DEBRIDEMENT BUTTOCKS N/A 03/23/2017   Procedure: IRRIGATION AND DEBRIDEMENT BUTTOCKS, SETON PLACEMENT;  Surgeon: [name], MD;  Location: [facility];  Service: General;  Laterality: N/A;  . LAPAROSCOPY  11/23/2011   Procedure: LAPAROSCOPY DIAGNOSTIC;  Surgeon: Edward Jolly, MD;  Location: [facility];  Service: General;  Laterality: N/A;     reports that she has never smoked. She has never used smokeless tobacco. She reports that she does not drink alcohol or use drugs.  Allergies  Allergen Reactions  . Other Anaphylaxis    Reaction to [place] nuts   . Lactose Intolerance (Gi) Diarrhea    Family History  Problem Relation Age of Onset  . Diabetes Mother   . Hypertension Father     Prior to Admission medications   Medication Sig Start Date End Date Taking? Authorizing Provider  acetaminophen (TYLENOL) 500 MG tablet Take 1,000 mg by mouth daily as needed for moderate pain.    Yes [provider]  calcium carbonate (TUMS -  DOSED IN MG ELEMENTAL CALCIUM) 500 MG chewable tablet Chew 1 tablet (200 mg of elemental calcium total) by mouth 3 (three) times daily. 07/13/17  Yes Emokpae, Courage, MD  cholestyramine light (PREVALITE) 4 g packet Take 1 packet (4 g total) by mouth 3 (three) times daily. Patient taking differently: Take 4 g by mouth 2 (two) times daily as needed (diarrhea).  03/14/17  Yes Lavina Hamman, MD   citalopram (CELEXA) 20 MG tablet Take 1 tablet (20 mg total) by mouth daily. 07/13/17  Yes Roxan Hockey, MD  ferrous sulfate 325 (65 FE) MG tablet Take 1 tablet (325 mg total) by mouth 2 (two) times daily with a meal. 05/13/17  Yes Henson, Vickie L, NP-C  furosemide (LASIX) 40 MG tablet Take 40 mg by mouth daily as needed for fluid.    Yes [provider]  gabapentin (NEURONTIN) 300 MG capsule Take 1 capsule (300 mg total) by mouth 2 (two) times daily. 04/03/17  Yes Henson, Vickie L, NP-C  glucose 4 GM chewable tablet Chew 1 tablet (4 g total) by mouth as needed for low blood sugar. 07/13/17  Yes Emokpae, Courage, MD  insulin lispro (HUMALOG) 100 UNIT/ML injection Inject 0.02 mLs (2 Units total) into the skin 3 (three) times daily with meals. Patient taking differently: Inject 0-9 Units into the skin 3 (three) times daily with meals. Sliding scale 04/09/17  Yes Renato Shin, MD  LANTUS 100 UNIT/ML injection Inject 0.12 mLs (12 Units total) into the skin daily. 07/13/17  Yes Emokpae, Courage, MD  lidocaine (LIDODERM) 5 % Place 1 patch onto the skin daily. Remove & Discard patch within 12 hours or as directed by MD Patient taking differently: Place 1 patch onto the skin daily as needed (pain). Remove & Discard patch within 12 hours or as directed by MD 05/21/17  Yes Khatri, Hina, PA-C  lidocaine (XYLOCAINE) 2 % solution Use as directed 15 mLs in the mouth or throat every 6 (six) hours as needed (stomach pain). 05/31/17  Yes Cardama, Grayce Sessions, MD  lipase/protease/amylase (CREON) 36000 UNITS CPEP capsule Take 1 capsule (36,000 Units total) by mouth 3 (three) times daily with meals. 03/14/17  Yes Lavina Hamman, MD  metoCLOPramide (REGLAN) 5 MG tablet Take 5 mg by mouth every 6 (six) hours as needed for nausea or vomiting.    Yes [provider]  ondansetron (ZOFRAN ODT) 4 MG disintegrating tablet Take 1 tablet (4 mg total) by mouth every 8 (eight) hours as needed for nausea or vomiting.  07/09/17  Yes Duffy Bruce, MD  sucralfate (CARAFATE) 1 GM/10ML suspension Take 10 mLs (1 g total) by mouth 4 (four) times daily -  with meals and at bedtime. 06/17/17  Yes Upstill, Nehemiah Settle, PA-C  Alum & Mag Hydroxide-Simeth (GI COCKTAIL) SUSP suspension Take 30 mLs by mouth 3 (three) times daily as needed for indigestion. Shake well. Patient not taking: Reported on 07/23/2017 07/13/17   Roxan Hockey, MD  cyclobenzaprine (FLEXERIL) 10 MG tablet Take 1 tablet (10 mg total) by mouth 2 (two) times daily as needed for muscle spasms. Patient not taking: Reported on 07/25/2017 05/21/17   Delia Heady, PA-C  famotidine (PEPCID) 40 MG tablet Take 1 tablet (40 mg total) by mouth 2 (two) times daily. Patient not taking: Reported on 07/23/2017 06/21/17   Recardo Evangelist, PA-C  HYDROcodone-acetaminophen (NORCO/VICODIN) 5-325 MG tablet Take 1 tablet by mouth every 6 (six) hours as needed for moderate pain or severe pain. Patient not taking: Reported on  07/25/2017 07/13/17   Roxan Hockey, MD  lactobacillus (FLORANEX/LACTINEX) PACK Take 1 packet (1 g total) by mouth 3 (three) times daily with meals. 07/13/17   Roxan Hockey, MD  Multiple Vitamin (MULTIVITAMIN WITH MINERALS) TABS tablet Take 1 tablet by mouth daily. Patient not taking: Reported on 07/23/2017 07/14/17   Roxan Hockey, MD  ondansetron (ZOFRAN) 4 MG tablet Take 1 tablet (4 mg total) by mouth every 6 (six) hours as needed for nausea. Patient not taking: Reported on 07/23/2017 07/13/17   Roxan Hockey, MD    Physical Exam: Vitals:   07/26/17 2047 07/27/17 0318  BP: (!) 116/97 (!) 155/100  Pulse: 88 (!) 107  Resp: 20 14  Temp: 97.8 F (36.6 C)   TempSrc: Oral   SpO2: 98% 99%      Constitutional: Moderately built and nourished. Vitals:   07/26/17 2047 07/27/17 0318  BP: (!) 116/97 (!) 155/100  Pulse: 88 (!) 107  Resp: 20 14  Temp: 97.8 F (36.6 C)   TempSrc: Oral   SpO2: 98% 99%   Eyes: Anicteric no pallor. ENMT: No  discharge from the ears eyes nose or mouth. Neck: No mass palpated no neck rigidity. Respiratory: No rhonchi or crepitations. Cardiovascular: S1-S2 heard no murmurs appreciated. Abdomen: Soft nontender bowel sounds present. Musculoskeletal: No edema.  No joint effusion. Skin: No rash.  Skin appears warm. Neurologic: Alert awake oriented to time place and person.  Moves all extremities. Psychiatric: Appears normal.  Normal affect.   Labs on Admission: I have personally reviewed following labs and imaging studies  CBC: Recent Labs  Lab 07/23/17 1223 07/23/17 1806 07/25/17 1253 07/27/17 0130  WBC 4.3  --  3.0* 4.5  NEUTROABS  --   --   --  3.8  HGB 13.2 9.5* 11.7* 12.2  HCT 38.8 28.0* 34.7* 34.8*  MCV 87.0  --  86.5 84.9  PLT 263  --  230 025   Basic Metabolic Panel: Recent Labs  Lab 07/23/17 1223 07/23/17 1806 07/25/17 1253 07/26/17 2330  NA 130* 136 137 141  K 5.2* 4.1 4.0 3.8  CL 88* 98* 98* 101  CO2 28  --  24 19*  GLUCOSE 653* 342* 425* 378*  BUN 25* 19 12 12   CREATININE 0.79 0.50 0.61 0.66  CALCIUM 10.4*  --  9.2 9.9   GFR: Estimated Creatinine Clearance: 98.4 mL/min (by C-G formula based on SCr of 0.66 mg/dL). Liver Function Tests: Recent Labs  Lab 07/26/17 2330  AST 31  ALT 47  ALKPHOS 108  BILITOT 1.2  PROT 9.0*  ALBUMIN 4.4   Recent Labs  Lab 07/26/17 2330  LIPASE 19   No results for input(s): AMMONIA in the last 168 hours. Coagulation Profile: No results for input(s): INR, PROTIME in the last 168 hours. Cardiac Enzymes: No results for input(s): CKTOTAL, CKMB, CKMBINDEX, TROPONINI in the last 168 hours. BNP (last 3 results) No results for input(s): PROBNP in the last 8760 hours. HbA1C: No results for input(s): HGBA1C in the last 72 hours. CBG: Recent Labs  Lab 07/25/17 1153 07/25/17 1301 07/25/17 1432 07/27/17 0130 07/27/17 0253  GLUCAP 406* 359* 288* 382* 278*   Lipid Profile: No results for input(s): CHOL, HDL, LDLCALC, TRIG,  CHOLHDL, LDLDIRECT in the last 72 hours. Thyroid Function Tests: No results for input(s): TSH, T4TOTAL, FREET4, T3FREE, THYROIDAB in the last 72 hours. Anemia Panel: No results for input(s): VITAMINB12, FOLATE, FERRITIN, TIBC, IRON, RETICCTPCT in the last 72 hours. Urine analysis:  Component Value Date/Time   COLORURINE STRAW (A) 07/23/2017 1223   APPEARANCEUR CLEAR 07/23/2017 1223   LABSPEC 1.027 07/23/2017 1223   PHURINE 7.0 07/23/2017 1223   GLUCOSEU >=500 (A) 07/23/2017 1223   HGBUR SMALL (A) 07/23/2017 1223   BILIRUBINUR NEGATIVE 07/23/2017 1223   BILIRUBINUR negative 01/02/2015 1717   KETONESUR 5 (A) 07/23/2017 1223   PROTEINUR NEGATIVE 07/23/2017 1223   UROBILINOGEN 0.2 01/13/2015 0223   NITRITE NEGATIVE 07/23/2017 1223   LEUKOCYTESUR NEGATIVE 07/23/2017 1223   Sepsis Labs: @LABRCNTIP (procalcitonin:4,lacticidven:4) )No results found for this or any previous visit (from the past 240 hour(s)).   Radiological Exams on Admission: No results found.  EKG: Independently reviewed.  Sinus tachycardia.  Assessment/Plan Principal Problem:   DKA (diabetic ketoacidoses) ([facility]) Active Problems:   Nausea and vomiting    1. Diabetic ketoacidosis -patient placed on insulin drip and fluids.  Closely follow metabolic panel.  Will change to long-acting insulin once patient can tolerate oral diet and gap gets corrected.  Hemoglobin A1c 3 months ago was 5.7. 2. Nausea vomiting and diarrhea -not sure if it is from gastroparesis or gastroenteritis.  Patient was recently on antibiotics.  If there is any further episodes of diarrhea will get stool studies.  May consider further imaging if vomiting persist.  Abdomen on exam appears benign. 3. Recently admitted for right buttock abscess.  It was not drained.  Presently patient states that site his not having any active discharge.  Antibiotic course was over last week.  Has appointment with Rennis Chris next week for further assessment.  Will  get wound team consult. 4. History of depression on Celexa. 5. History of anemia on iron supplements.   DVT prophylaxis: Lovenox. Code Status: Full code. Family Communication: Patient's husband at the bedside. Disposition Plan: Home. Consults called: None. Admission status: Inpatient.   Rise Patience MD [facility] Pager [phone].  If 7PM-7AM, please contact night-coverage [email or web address] Password [facility]  07/27/2017, 3:40 AM

## 2017-07-27 NOTE — ED Notes (Signed)
Bed: WA12 Expected date:  Expected time:  Means of arrival:  Comments: Room 8 

## 2017-07-27 NOTE — ED Notes (Signed)
ED TO INPATIENT HANDOFF REPORT  Name/Age/Gender [name] 27 y.o. female  Code Status    Code Status Orders  (From admission, onward)        Start     Ordered   07/27/17 0339  Full code  Continuous     07/27/17 0339    Code Status History    Date Active Date Inactive Code Status Order ID Comments User Context   07/10/2017 0441 07/13/2017 1908 Full Code [id number]  Vianne Bulls, MD ED   06/23/2017 2017 06/27/2017 2144 Full Code [id number]  Norval Morton, MD ED   04/10/2017 1406 04/16/2017 1816 Full Code [id number]  Phillips Grout, MD Inpatient   03/23/2017 1316 03/24/2017 1854 Full Code [id number]  Phillips Grout, MD Inpatient   03/17/2017 0050 03/21/2017 2352 Full Code [id number]  Rise Patience, MD ED   02/26/2017 2259 03/14/2017 2221 Full Code [id number]  Karmen Bongo, MD Inpatient   02/26/2017 2012 02/26/2017 2259 Full Code [id number]  Aldean Jewett, MD ED   01/26/2015 0513 01/29/2015 1812 Full Code [id number]  Morene Crocker, CNM Inpatient   01/24/2015 1021 01/26/2015 0513 Full Code [id number]  Foley, Georgia Dom, RN Inpatient   01/16/2015 1930 01/24/2015 1021 Full Code [id number]  Gwen Pounds, Sharkey Inpatient   06/11/2013 0049 06/12/2013 1340 Full Code [id number]  Theressa Millard, MD ED   04/28/2013 0319 04/29/2013 1814 Full Code [id number]  Rise Patience, MD Inpatient   09/01/2012 0749 09/02/2012 1159 Full Code [id number]  Derrill Kay, MD Inpatient   03/15/2012 2346 03/16/2012 1646 Full Code [id number]  Rueben Bash, RN Inpatient   02/28/2012 1657 03/01/2012 1921 Full Code [id number]  Tamsen Roers, RN Inpatient   11/23/2011 0336 12/06/2011 2004 Full Code [id number]  Aldean Ast, RN Inpatient   09/18/2011 1007 09/20/2011 2050 Full Code [id number]  Dionne Milo, NP Inpatient      Home/SNF/Other Home  Chief Complaint weakness  Level of Care/Admitting Diagnosis ED Disposition    ED Disposition Condition Comment   Admit  Hospital Area: [facility] [[id number]]  Level of Care: Med-Surg [16]  Diagnosis: Gastroparesis [536.3.ICD-9-CM]  Admitting Physician: Lavina Hamman [[id number]]  Attending Physician: Lavina Hamman [[id number]]  Estimated length of stay: past midnight tomorrow  Certification:: I certify this patient will need inpatient services for at least 2 midnights  PT Class (Do Not Modify): Inpatient [101]  PT Acc Code (Do Not Modify): Private [1]       Medical History Past Medical History:  Diagnosis Date  . Anxiety   . Diabetes type 1, uncontrolled ([place]) 11/14/2011   Since age 89  . Fibromyalgia   . Gastroparesis   . Hypertension   . Infection    UTI April 2016    Allergies Allergies  Allergen Reactions  . Other Anaphylaxis    Reaction to [place] nuts   . Lactose Intolerance (Gi) Diarrhea    IV Location/Drains/Wounds Patient Lines/Drains/Airways Status   Active Line/Drains/Airways    Name:   Placement date:   Placement time:   Site:   Days:   Peripheral IV 07/27/17 Right Hand   07/27/17    0124    Hand   less than 1   Wound / Incision (Open or Dehisced) 07/10/17 Incision - Open Rectum 2 Penrose drains on bottom   07/10/17    0918    Rectum   17  Labs/Imaging Results for orders placed or performed during the hospital encounter of 07/26/17 (from the past 48 hour(s))  Comprehensive metabolic panel     Status: Abnormal   Collection Time: 07/26/17 11:30 PM  Result Value Ref Range   Sodium 141 135 - 145 mmol/L   Potassium 3.8 3.5 - 5.1 mmol/L   Chloride 101 101 - 111 mmol/L   CO2 19 (L) 22 - 32 mmol/L   Glucose, Bld 378 (H) 65 - 99 mg/dL   BUN 12 6 - 20 mg/dL   Creatinine, Ser 0.66 0.44 - 1.00 mg/dL   Calcium 9.9 8.9 - 10.3 mg/dL   Total Protein 9.0 (H) 6.5 - 8.1 g/dL   Albumin 4.4 3.5 - 5.0 g/dL   AST 31 15 - 41 U/L   ALT 47 14 - 54 U/L   Alkaline Phosphatase 108 38 - 126 U/L   Total Bilirubin 1.2 0.3 - 1.2 mg/dL   GFR calc non Af Amer >60 >60 mL/min   GFR calc Af Amer  >60 >60 mL/min    Comment: (NOTE) The eGFR has been calculated using the CKD EPI equation. This calculation has not been validated in all clinical situations. eGFR's persistently <60 mL/min signify possible Chronic Kidney Disease.    Anion gap 21 (H) 5 - 15    Comment: REPEATED TO VERIFY Performed at [facility], [place] [address] [place], [place] [address]   Lipase, blood     Status: None   Collection Time: 07/26/17 11:30 PM  Result Value Ref Range   Lipase 19 11 - 51 U/L    Comment: Performed at [facility], [place] [address], [place], [place] [address]  I-Stat Beta hCG blood, ED ([facility], [facility], [facility] only)     Status: None   Collection Time: 07/26/17 11:38 PM  Result Value Ref Range   I-stat hCG, quantitative <5.0 <5 mIU/mL   Comment 3            Comment:   GEST. AGE      CONC.  (mIU/mL)   <=1 WEEK        5 - 50     2 WEEKS       50 - 500     3 WEEKS       100 - 10,000     4 WEEKS     1,000 - 30,000        FEMALE AND NON-PREGNANT FEMALE:     LESS THAN 5 mIU/mL   Urinalysis, Routine w reflex microscopic     Status: Abnormal   Collection Time: 07/27/17  1:07 AM  Result Value Ref Range   Color, Urine YELLOW YELLOW   APPearance CLEAR CLEAR   Specific Gravity, Urine 1.023 1.005 - 1.030   pH 6.0 5.0 - 8.0   Glucose, UA >=500 (A) NEGATIVE mg/dL   Hgb urine dipstick SMALL (A) NEGATIVE   Bilirubin Urine NEGATIVE NEGATIVE   Ketones, ur 80 (A) NEGATIVE mg/dL   Protein, ur 100 (A) NEGATIVE mg/dL   Nitrite NEGATIVE NEGATIVE   Leukocytes, UA NEGATIVE NEGATIVE   RBC / HPF 11-20 0 - 5 RBC/hpf   WBC, UA 0-5 0 - 5 WBC/hpf   Bacteria, UA RARE (A) NONE SEEN   Squamous Epithelial / LPF 0-5 0 - 5    Comment: Please note change in reference range.   Mucus PRESENT    Hyaline Casts, UA PRESENT     Comment: Performed at [facility]  [facility], [place] [address] [place], [place] [address]  Blood gas, venous     Status: Abnormal   Collection Time: 07/27/17   1:25 AM  Result Value Ref Range   FIO2 21.00    Delivery systems ROOM AIR    pH, Ven 7.321 7.250 - 7.430   pCO2, Ven 42.1 (L) 44.0 - 60.0 mmHg   pO2, Ven 44.3 32.0 - 45.0 mmHg   Bicarbonate 21.1 20.0 - 28.0 mmol/L   Acid-base deficit 4.3 (H) 0.0 - 2.0 mmol/L   O2 Saturation 77.2 %   Patient temperature 98.6    Collection site VEIN    Drawn by COLLECTED BY NURSE    Sample type VENOUS     Comment: Performed at [place] [address] [place], [place] [address]  CBC     Status: Abnormal   Collection Time: 07/27/17  1:30 AM  Result Value Ref Range   WBC 4.5 4.0 - 10.5 K/uL   RBC 4.10 3.87 - 5.11 MIL/uL   Hemoglobin 12.2 12.0 - 15.0 g/dL   HCT 34.8 (L) 36.0 - 46.0 %   MCV 84.9 78.0 - 100.0 fL   MCH 29.8 26.0 - 34.0 pg   MCHC 35.1 30.0 - 36.0 g/dL   RDW 12.6 11.5 - 15.5 %   Platelets 220 150 - 400 K/uL    Comment: Performed at [facility], [place] [address], [place], [place] [address]  Differential     Status: Abnormal   Collection Time: 07/27/17  1:30 AM  Result Value Ref Range   Neutrophils Relative % 83 %   Neutro Abs 3.8 1.7 - 7.7 K/uL   Lymphocytes Relative 14 %   Lymphs Abs 0.6 (L) 0.7 - 4.0 K/uL   Monocytes Relative 3 %   Monocytes Absolute 0.1 0.1 - 1.0 K/uL   Eosinophils Relative 0 %   Eosinophils Absolute 0.0 0.0 - 0.7 K/uL   Basophils Relative 0 %   Basophils Absolute 0.0 0.0 - 0.1 K/uL    Comment: Performed at [facility], [place] [address] [place], [place] [address]  CBG monitoring, ED     Status: Abnormal   Collection Time: 07/27/17  1:30 AM  Result Value Ref Range   Glucose-Capillary 382 (H) 65 - 99 mg/dL  CBG monitoring, ED     Status: Abnormal   Collection Time: 07/27/17  2:53 AM  Result Value Ref Range   Glucose-Capillary 278 (H) 65 - 99 mg/dL  CBG monitoring, ED     Status: Abnormal   Collection Time: 07/27/17  4:00 AM  Result Value Ref Range   Glucose-Capillary 232 (H) 65 - 99 mg/dL  Basic  metabolic panel     Status: Abnormal   Collection Time: 07/27/17  4:07 AM  Result Value Ref Range   Sodium 143 135 - 145 mmol/L   Potassium 3.2 (L) 3.5 - 5.1 mmol/L   Chloride 109 101 - 111 mmol/L   CO2 23 22 - 32 mmol/L   Glucose, Bld 238 (H) 65 - 99 mg/dL   BUN 14 6 - 20 mg/dL   Creatinine, Ser 0.53 0.44 - 1.00 mg/dL   Calcium 8.4 (L) 8.9 - 10.3 mg/dL   GFR calc non Af Amer >60 >60 mL/min   GFR calc Af Amer >60 >60 mL/min    Comment: (NOTE) The eGFR has been calculated using the CKD EPI equation. This calculation has not been validated in all clinical situations. eGFR's persistently <  60 mL/min signify possible Chronic Kidney Disease.    Anion gap 11 5 - 15    Comment: Performed at [facility], [place] [address] [place], [place] [address]  Troponin I     Status: None   Collection Time: 07/27/17  4:07 AM  Result Value Ref Range   Troponin I <0.03 <0.03 ng/mL    Comment: Performed at [facility], [place] [address], [place], [place] [address]  CBG monitoring, ED     Status: Abnormal   Collection Time: 07/27/17  5:05 AM  Result Value Ref Range   Glucose-Capillary 206 (H) 65 - 99 mg/dL   Comment 1 Notify RN   Urine rapid drug screen (hosp performed)     Status: Abnormal   Collection Time: 07/27/17  5:43 AM  Result Value Ref Range   Opiates POSITIVE (A) NONE DETECTED   Cocaine NONE DETECTED NONE DETECTED   Benzodiazepines NONE DETECTED NONE DETECTED   Amphetamines NONE DETECTED NONE DETECTED   Tetrahydrocannabinol POSITIVE (A) NONE DETECTED   Barbiturates NONE DETECTED NONE DETECTED    Comment: (NOTE) DRUG SCREEN FOR MEDICAL PURPOSES ONLY.  IF CONFIRMATION IS NEEDED FOR ANY PURPOSE, NOTIFY LAB WITHIN 5 DAYS. LOWEST DETECTABLE LIMITS FOR URINE DRUG SCREEN Drug Class                     Cutoff (ng/mL) Amphetamine and metabolites    1000 Barbiturate and metabolites    200 Benzodiazepine                 003 Tricyclics and metabolites      300 Opiates and metabolites        300 Cocaine and metabolites        300 THC                            50 Performed at [facility], [place] [address] [place], [place] [address]   CBG monitoring, ED     Status: Abnormal   Collection Time: 07/27/17  6:13 AM  Result Value Ref Range   Glucose-Capillary 149 (H) 65 - 99 mg/dL  CBG monitoring, ED     Status: Abnormal   Collection Time: 07/27/17  7:15 AM  Result Value Ref Range   Glucose-Capillary 134 (H) 65 - 99 mg/dL  CBG monitoring, ED     Status: Abnormal   Collection Time: 07/27/17  8:34 AM  Result Value Ref Range   Glucose-Capillary 117 (H) 65 - 99 mg/dL  Basic metabolic panel     Status: Abnormal   Collection Time: 07/27/17  8:47 AM  Result Value Ref Range   Sodium 144 135 - 145 mmol/L   Potassium 3.3 (L) 3.5 - 5.1 mmol/L   Chloride 111 101 - 111 mmol/L   CO2 25 22 - 32 mmol/L   Glucose, Bld 131 (H) 65 - 99 mg/dL   BUN 15 6 - 20 mg/dL   Creatinine, Ser 0.44 0.44 - 1.00 mg/dL   Calcium 8.6 (L) 8.9 - 10.3 mg/dL   GFR calc non Af Amer >60 >60 mL/min   GFR calc Af Amer >60 >60 mL/min    Comment: (NOTE) The eGFR has been calculated using the CKD EPI equation. This calculation has not been validated in all clinical situations. eGFR's persistently <60 mL/min signify possible Chronic Kidney Disease.    Anion gap 8 5 - 15  Comment: Performed at [facility], [place] [address], [place], [place] [address]  CBG monitoring, ED     Status: Abnormal   Collection Time: 07/27/17 10:42 AM  Result Value Ref Range   Glucose-Capillary 127 (H) 65 - 99 mg/dL  CBG monitoring, ED     Status: Abnormal   Collection Time: 07/27/17 11:48 AM  Result Value Ref Range   Glucose-Capillary 164 (H) 65 - 99 mg/dL   No results found.  Pending Labs Unresulted Labs (From admission, onward)   Start     Ordered   07/28/17 6387  Basic metabolic panel  Daily,   R     07/27/17 1007   07/26/17 2159  CBC with  Differential  STAT,   STAT     07/26/17 2158      Vitals/Pain Today's Vitals   07/27/17 1100 07/27/17 1106 07/27/17 1200 07/27/17 1301  BP: (!) 131/92 (!) 131/92 128/87 (!) 162/109  Pulse:  93  (!) 109  Resp: '16 16 14 17  '$ Temp:      TempSrc:      SpO2: 100% 100% 100% 100%  PainSc:        Isolation Precautions No active isolations  Medications Medications  citalopram (CELEXA) tablet 20 mg (0 mg Oral Hold 07/27/17 1047)  ferrous sulfate tablet 325 mg (0 mg Oral Hold 07/27/17 0923)  gabapentin (NEURONTIN) capsule 300 mg (0 mg Oral Hold 07/27/17 1047)  cholestyramine light (PREVALITE) packet 4 g (0 g Oral Hold 07/27/17 1048)  ketorolac (TORADOL) 30 MG/ML injection 30 mg (30 mg Intravenous Given 07/27/17 0933)  HYDROcodone-acetaminophen (NORCO/VICODIN) 5-325 MG per tablet 1 tablet (1 tablet Oral Given 07/27/17 1245)  insulin glargine (LANTUS) injection 12 Units (12 Units Subcutaneous Given 07/27/17 1057)  insulin aspart (novoLOG) injection 3 Units (has no administration in time range)  insulin aspart (novoLOG) injection 0-9 Units (has no administration in time range)  ondansetron (ZOFRAN) injection 4 mg (4 mg Intravenous Given 07/27/17 1245)  metoCLOPramide (REGLAN) tablet 5 mg (has no administration in time range)  potassium chloride SA (K-DUR,KLOR-CON) CR tablet 40 mEq (has no administration in time range)  haloperidol lactate (HALDOL) injection 5 mg (5 mg Intramuscular Given 07/27/17 0004)  HYDROmorphone (DILAUDID) injection 1 mg (1 mg Intramuscular Given 07/27/17 0003)  sodium chloride 0.9 % bolus 1,000 mL (0 mLs Intravenous Stopped 07/27/17 0319)    And  sodium chloride 0.9 % bolus 1,000 mL (0 mLs Intravenous Stopped 07/27/17 0319)  metoCLOPramide (REGLAN) injection 5 mg (5 mg Intravenous Given 07/27/17 0304)  diphenhydrAMINE (BENADRYL) injection 25 mg (25 mg Intravenous Given 07/27/17 0304)    Mobility Walks  ]

## 2017-07-28 LAB — BASIC METABOLIC PANEL
Anion gap: 15 (ref 5–15)
BUN: 20 mg/dL (ref 6–20)
CO2: 24 mmol/L (ref 22–32)
Calcium: 9.4 mg/dL (ref 8.9–10.3)
Chloride: 98 mmol/L — ABNORMAL LOW (ref 101–111)
Creatinine, Ser: 0.71 mg/dL (ref 0.44–1.00)
GFR calc Af Amer: >60 mL/min (ref >60)
GFR calc non Af Amer: >60 mL/min (ref >60)
Glucose, Bld: 341 mg/dL — ABNORMAL HIGH (ref 65–99)
Potassium: 3.1 mmol/L — ABNORMAL LOW (ref 3.5–5.1)
Sodium: 137 mmol/L (ref 135–145)

## 2017-07-28 LAB — CBC WITH DIFFERENTIAL/PLATELET
Basophils Absolute: 0 10*3/uL (ref 0.0–0.1)
Basophils Relative: 0 %
Eosinophils Absolute: 0 10*3/uL (ref 0.0–0.7)
Eosinophils Relative: 0 %
HCT: 32.4 % — ABNORMAL LOW (ref 36.0–46.0)
Hemoglobin: 11.4 g/dL — ABNORMAL LOW (ref 12.0–15.0)
Lymphocytes Relative: 24 %
Lymphs Abs: 1.2 10*3/uL (ref 0.7–4.0)
MCH: 30.1 pg (ref 26.0–34.0)
MCHC: 35.2 g/dL (ref 30.0–36.0)
MCV: 85.5 fL (ref 78.0–100.0)
Monocytes Absolute: 0.4 10*3/uL (ref 0.1–1.0)
Monocytes Relative: 8 %
Neutro Abs: 3.4 10*3/uL (ref 1.7–7.7)
Neutrophils Relative %: 68 %
Platelets: 253 10*3/uL (ref 150–400)
RBC: 3.79 MIL/uL — ABNORMAL LOW (ref 3.87–5.11)
RDW: 13 % (ref 11.5–15.5)
WBC: 5 10*3/uL (ref 4.0–10.5)

## 2017-07-28 LAB — GLUCOSE, CAPILLARY
Glucose-Capillary: 322 mg/dL — ABNORMAL HIGH (ref 65–99)
Glucose-Capillary: 215 mg/dL — ABNORMAL HIGH (ref 65–99)
Glucose-Capillary: 37 mg/dL — CL (ref 65–99)
Glucose-Capillary: 85 mg/dL (ref 65–99)
Glucose-Capillary: 115 mg/dL — ABNORMAL HIGH (ref 65–99)

## 2017-07-28 LAB — LACTIC ACID, PLASMA: Lactic Acid, Venous: 1.2 mmol/L (ref 0.5–1.9)

## 2017-07-28 MED ORDER — ADULT MULTIVITAMIN W/MINERALS CH
1.0000 | ORAL_TABLET | Freq: Every day | ORAL | Status: DC
  Administered 2017-07-31: 1 via ORAL
  Filled 2017-07-28 (×4): qty 1

## 2017-07-28 MED ORDER — DIPHENHYDRAMINE HCL 25 MG PO CAPS
25.0000 mg | ORAL_CAPSULE | Freq: Four times a day (QID) | ORAL | Status: DC | PRN
  Administered 2017-07-28 – 2017-08-01 (×9): 25 mg via ORAL
  Filled 2017-07-28 (×9): qty 1

## 2017-07-28 MED ORDER — DULOXETINE HCL 20 MG PO CPEP
20.0000 mg | ORAL_CAPSULE | Freq: Every day | ORAL | Status: DC
  Administered 2017-07-28 – 2017-08-01 (×3): 20 mg via ORAL
  Filled 2017-07-28 (×6): qty 1

## 2017-07-28 MED ORDER — INSULIN GLARGINE 100 UNIT/ML ~~LOC~~ SOLN
8.0000 [IU] | Freq: Every day | SUBCUTANEOUS | Status: DC
  Administered 2017-07-29 – 2017-08-01 (×4): 8 [IU] via SUBCUTANEOUS
  Filled 2017-07-28 (×4): qty 0.08

## 2017-07-28 MED ORDER — GLUCERNA SHAKE PO LIQD
237.0000 mL | Freq: Three times a day (TID) | ORAL | Status: DC
  Administered 2017-07-29 – 2017-08-01 (×3): 237 mL via ORAL
  Filled 2017-07-28 (×14): qty 237

## 2017-07-28 MED ORDER — GABAPENTIN 300 MG PO CAPS
300.0000 mg | ORAL_CAPSULE | Freq: Three times a day (TID) | ORAL | Status: DC
  Administered 2017-07-28 – 2017-08-01 (×10): 300 mg via ORAL
  Filled 2017-07-28 (×12): qty 1

## 2017-07-28 MED ORDER — DULOXETINE HCL 30 MG PO CPEP: 30.0000 mg | ORAL_CAPSULE | Freq: Every day | ORAL | Status: DC

## 2017-07-28 MED ORDER — CITALOPRAM HYDROBROMIDE 20 MG PO TABS
10.0000 mg | ORAL_TABLET | Freq: Every day | ORAL | Status: DC
  Administered 2017-07-31 – 2017-08-01 (×2): 10 mg via ORAL
  Filled 2017-07-28 (×5): qty 1

## 2017-07-28 MED ORDER — SODIUM CHLORIDE 0.9 % IV BOLUS
500.0000 mL | Freq: Once | INTRAVENOUS | Status: AC
  Administered 2017-07-28: 500 mL via INTRAVENOUS

## 2017-07-28 NOTE — Progress Notes (Signed)
Inpatient Diabetes Program Recommendations  AACE/ADA: New Consensus Statement on Inpatient Glycemic Control (2015)  Target Ranges:  Prepandial:   less than 140 mg/dL      Peak postprandial:   less than 180 mg/dL (1-2 hours)      Critically ill patients:  140 - 180 mg/dL   Lab Results  Component Value Date   GLUCAP 215 (H) 07/28/2017   HGBA1C 5.7 (H) 04/10/2017    Review of Glycemic Control  Diabetes history: DM1 Outpatient Diabetes medications: Lantus 12 units (decreased from 18 units on 07/13/17) + Humalog 2 units tid meal coverage Current orders for Inpatient glycemic control: Lantus 12 units + Novolog 3 units tid meal coverage + Novolog sensitive correction tid  Spoke with patient @ bedside regarding blood glucose management prior to admission. Patient states her only sliding scale of Humalog was for food and she didn't take any correction when her blood glucose was rising prior to coming to the doctor. Reviewed with patient her physician plans to place on meal coverage to use if she eats 50% and correction scale to cover elevated CBGs. Also reviewed hypoglycemia with 15:15 rule and patient was able to verbalize back to me. Patient states she is taking her insulin as  Prescribed and has insulin available to her @ home. Patient has been seen by Diabetes Coordinator multiple times in the past.  Thank you, [name]. Icess Bertoni, RN, MSN, CDE  Diabetes Coordinator Inpatient Glycemic Control Team Team Pager [phone] (8am-5pm) 07/28/2017 11:19 AM

## 2017-07-28 NOTE — Progress Notes (Signed)
Initial Nutrition Assessment  DOCUMENTATION CODES:   Non-severe (moderate) malnutrition in context of chronic illness  INTERVENTION:    Glucerna Shake po TID, each supplement provides 220 kcal and 10 grams of protein  Provide MVI daily  NUTRITION DIAGNOSIS:   Moderate Malnutrition related to chronic illness(uncontrolled DM) as evidenced by percent weight loss, moderate muscle depletion, mild muscle depletion.  GOAL:   Patient will meet greater than or equal to 90% of their needs  MONITOR:   PO intake, Supplement acceptance, Weight trends, Labs  REASON FOR ASSESSMENT:   Malnutrition Screening Tool    ASSESSMENT:   Patient with PMH significant for insulin-dependent diabetes mellitus, gastroparesis, HTN, chronic normocytic anemia, and gluteal abscess. Recently admitted 4/12 for intractable nausea and vomiting. Presents this admission with DKA and vomiting/diarrhea.    Pt in pain upon assessment. Reports appetite starting decreasing 5 days PTA due to ongoing vomiting/diarrhea (unable to elaborate on dietary recall). States prior to this time frame her appetite was great and she could tolerate 3 meals each day. Pt continued Glucerna after her last discharge and drinks them every other day. Pt has not had anything for breakfast, but consumed 25% of her cream of chicken soup last night. RD observed emesis bag filled at bedside.   Pt denies any recent weight loss. Records indicate pt weighed 181 lb 03/11/17 and RD obtained bed weight reading of 140 lb (22.7 % wt loss in 4 months, significant for time frame). Suspect malnutrition is improving but pt still meets requirements this admission. Nutrition-Focused physical exam completed.   Medications reviewed and include: ferrous sulfate, SSI, Lantus, Reglan Labs reviewed: K 3.1 (L) CBG 341 (H)  NUTRITION - FOCUSED PHYSICAL EXAM:    Most Recent Value  Orbital Region  No depletion  Upper Arm Region  No depletion  Thoracic and Lumbar  Region  Unable to assess  Buccal Region  No depletion  Temple Region  Mild depletion  Clavicle Bone Region  Moderate depletion  Clavicle and Acromion Bone Region  Moderate depletion  Scapular Bone Region  Unable to assess  Dorsal Hand  Mild depletion  Patellar Region  Moderate depletion  Anterior Thigh Region  Mild depletion  Posterior Calf Region  Moderate depletion  Edema (RD Assessment)  None       Diet Order:   Diet Order           Diet Carb Modified Fluid consistency: Thin; Room service appropriate? Yes  Diet effective now          EDUCATION NEEDS:   Education needs have been addressed  Skin:  Skin Assessment: Reviewed RN Assessment  Last BM:  07/28/17  Height:   Ht Readings from Last 1 Encounters:  07/25/17 5\' 7"  (1.702 m)    Weight:   Wt Readings from Last 1 Encounters:  07/28/17 140 lb 3.2 oz (63.6 kg)    Ideal Body Weight:  61.4 kg  BMI:  Body mass index is 21.96 kg/m.  Estimated Nutritional Needs:   Kcal:  1650-1850 kcal  Protein:  80-90 g  Fluid:  >1.6 L/day    Vanessa Kick RD, LDN Clinical Nutrition Pager # [phone]

## 2017-07-28 NOTE — Progress Notes (Signed)
BP 94/62 manual, HR 88. Patient complain of feeling dizzy.  MD notified via text page.

## 2017-07-28 NOTE — Progress Notes (Signed)
[facility] Progress Note  Patient: [name] YKD:[id number]   PCP: Patient, No Pcp Per DOB: 1990/09/11   DOA: 07/26/2017   DOS: 07/28/2017   Date of Service: the patient was seen and examined on 07/28/2017  Subjective: Continues to have abdominal pain, nausea, reports vomiting although not witnessed.  No diarrhea.  Brief hospital course: Pt. with PMH of type I DM, gastroparesis, depression, chronic abdominal pain, right buttock abscess; admitted on 07/26/2017, presented with complaint of nausea and vomiting, was found to have DKA. Currently further plan is continue current management.  Assessment and Plan: 1.  DKA. Type 1 diabetes mellitus. Uncontrolled. Hypoglycemic right now. Continuing current regimen. Monitor.  2.  Chronic abdominal pain. Gastroparesis. Patient complains about having epigastric abdominal pain, lipase and lactic acid negative.  LFTs stable. At present this appears to be a flareup of her gastroparesis. Continue current regimen with scheduled Toradol. Patient requesting something stronger for pain, I recommended would not give her IV narcotics nor would increase her narcotic regimen.  3.  Uncontrolled hypertension. Blood pressure fluctuating. Patient goes sinus tachycardia with high blood pressure 2 low blood pressure with dizziness. Unsure about the  etiology. At present we will monitor give IV fluids. Continue lisinopril. Hold beta-blockers.  4.  Rectal abscess. Patient is scheduled to follow-up with Dr. Elmo Putt for further work-up. Wound team consulted. Monitor. Antibiotics completed. Not actively infected.  5.  Nausea and vomiting. Possibility of cyclical vomiting syndrome secondary to marijuana abuse cannot be ruled out.  Diet: soft diet DVT Prophylaxis: subcutaneous Heparin  Advance goals of care discussion: full code  Family Communication: no family was present at bedside, at the time of interview.   Disposition:  Discharge to  home.  Consultants: none Procedures: none  Antibiotics: Anti-infectives (From admission, onward)   None       Objective: Physical Exam: Vitals:   07/28/17 1357 07/28/17 1434 07/28/17 1442 07/28/17 1640  BP: 96/62 (!) 92/51 94/62 112/74  Pulse: 93 88    Resp: 18     Temp: 98.6 F (37 C)     TempSrc: Oral     SpO2: 100%     Weight:        Intake/Output Summary (Last 24 hours) at 07/28/2017 1714 Last data filed at 07/27/2017 1900 Gross per 24 hour  Intake 200 ml  Output -  Net 200 ml   Filed Weights   07/28/17 1328  Weight: 63.6 kg (140 lb 3.2 oz)   General: Alert, Awake and Oriented to Time, Place and Person. Appear in mild distress, affect flat Eyes: PERRL, Conjunctiva normal ENT: Oral Mucosa clear moist. Neck: no JVD, no Abnormal Mass Or lumps Cardiovascular: S1 and S2 Present, no Murmur, Peripheral Pulses Present Respiratory: normal respiratory effort, Bilateral Air entry equal and Decreased, no use of accessory muscle, Clear to Auscultation, no Crackles, no wheezes Abdomen: Bowel Sound present, Soft and no tenderness, no hernia Skin: no redness, nono Rash, no induration Extremities: no Pedal edema, no calf tenderness Neurologic: Grossly no focal neuro deficit. Bilaterally Equal motor strength  Data Reviewed: CBC: Recent Labs  Lab 07/23/17 1223 07/23/17 1806 07/25/17 1253 07/27/17 0130 07/28/17 0813  WBC 4.3  --  3.0* 4.5 5.0  NEUTROABS  --   --   --  3.8 3.4  HGB 13.2 9.5* 11.7* 12.2 11.4*  HCT 38.8 28.0* 34.7* 34.8* 32.4*  MCV 87.0  --  86.5 84.9 85.5  PLT 263  --  230 220 253   Basic  Metabolic Panel: Recent Labs  Lab 07/25/17 1253 07/26/17 2330 07/27/17 0407 07/27/17 0847 07/28/17 0813  NA 137 141 143 144 137  K 4.0 3.8 3.2* 3.3* 3.1*  CL 98* 101 109 111 98*  CO2 24 19* 23 25 24   GLUCOSE 425* 378* 238* 131* 341*  BUN 12 12 14 15 20   CREATININE 0.61 0.66 0.53 0.44 0.71  CALCIUM 9.2 9.9 8.4* 8.6* 9.4    Liver Function Tests: Recent  Labs  Lab 07/26/17 2330  AST 31  ALT 47  ALKPHOS 108  BILITOT 1.2  PROT 9.0*  ALBUMIN 4.4   Recent Labs  Lab 07/26/17 2330  LIPASE 19   No results for input(s): AMMONIA in the last 168 hours. Coagulation Profile: No results for input(s): INR, PROTIME in the last 168 hours. Cardiac Enzymes: Recent Labs  Lab 07/27/17 0407  TROPONINI <0.03   BNP (last 3 results) No results for input(s): PROBNP in the last 8760 hours. CBG: Recent Labs  Lab 07/27/17 2318 07/28/17 0758 07/28/17 1107 07/28/17 1641 07/28/17 1707  GLUCAP 151* 322* 215* 37* 85   Studies: No results found.  Scheduled Meds: . cholestyramine light  4 g Oral 3 times per day  . [START ON 07/29/2017] citalopram  10 mg Oral Daily  . DULoxetine  20 mg Oral Daily  . feeding supplement (GLUCERNA SHAKE)  237 mL Oral TID BM  . ferrous sulfate  325 mg Oral BID WC  . gabapentin  300 mg Oral TID  . insulin aspart  0-9 Units Subcutaneous TID WC  . insulin aspart  3 Units Subcutaneous TID WC  . insulin glargine  12 Units Subcutaneous Daily  . ketorolac  30 mg Intravenous Q6H  . lisinopril  5 mg Oral Daily  . metoCLOPramide  5 mg Oral TID AC  . multivitamin with minerals  1 tablet Oral Daily   Continuous Infusions: PRN Meds: diphenhydrAMINE, gi cocktail, hydrALAZINE, HYDROcodone-acetaminophen, ondansetron (ZOFRAN) IV, zolpidem  Time spent: 35 minutes  Author: Berle Mull, MD [facility] Pager: [phone] 07/28/2017 5:14 PM  If 7PM-7AM, please contact night-coverage at [email or web address], password [facility]

## 2017-07-28 NOTE — Progress Notes (Signed)
Patient taking multiple showers. Patient stated the heat from the shower is all that helps her.  Patient has lost IV sites d/t showers.  MD notified for heat pad.

## 2017-07-28 NOTE — Progress Notes (Signed)
Patient requesting order to shower.  Patient stated that she has had 2 showers already today and it is the only thing that helps.  MD notified via text page.

## 2017-07-29 ENCOUNTER — Telehealth: Payer: Self-pay

## 2017-07-29 LAB — BASIC METABOLIC PANEL
Anion gap: 13 (ref 5–15)
BUN: 24 mg/dL — ABNORMAL HIGH (ref 6–20)
CO2: 28 mmol/L (ref 22–32)
Calcium: 9.3 mg/dL (ref 8.9–10.3)
Chloride: 100 mmol/L — ABNORMAL LOW (ref 101–111)
Creatinine, Ser: 0.55 mg/dL (ref 0.44–1.00)
GFR calc Af Amer: >60 mL/min (ref >60)
GFR calc non Af Amer: >60 mL/min (ref >60)
Glucose, Bld: 88 mg/dL (ref 65–99)
Potassium: 2.6 mmol/L — CL (ref 3.5–5.1)
Sodium: 141 mmol/L (ref 135–145)

## 2017-07-29 LAB — GLUCOSE, CAPILLARY
Glucose-Capillary: 105 mg/dL — ABNORMAL HIGH (ref 65–99)
Glucose-Capillary: 121 mg/dL — ABNORMAL HIGH (ref 65–99)
Glucose-Capillary: 85 mg/dL (ref 65–99)
Glucose-Capillary: 75 mg/dL (ref 65–99)

## 2017-07-29 MED ORDER — LORAZEPAM 2 MG/ML IJ SOLN
1.0000 mg | Freq: Once | INTRAMUSCULAR | Status: AC
  Administered 2017-07-29: 1 mg via INTRAVENOUS
  Filled 2017-07-29: qty 1

## 2017-07-29 MED ORDER — POTASSIUM CHLORIDE CRYS ER 10 MEQ PO TBCR
40.0000 meq | EXTENDED_RELEASE_TABLET | ORAL | Status: AC
  Administered 2017-07-29: 40 meq via ORAL
  Filled 2017-07-29: qty 4

## 2017-07-29 NOTE — Progress Notes (Signed)
Patient asked for Benadryl, walked into room and advised that it is not time but will be soon. Patient in shower sitting on BSC. Advised patient that she does not have an order from MD to shower and that day shift had asked for order but was not given one. Advised patient that this is a safety concern and patient is adamant that showering is the only thing that makes her feel better. Again, advised patient that she does not have an order to shower so she should return to bed. Patient verbalized understanding. Will c/t monitor.

## 2017-07-29 NOTE — Progress Notes (Signed)
Patient ID: [name], female   DOB: 1990/11/02, 27 y.o.   MRN: [id number]  PROGRESS NOTE    [name]  [id number]:[id number] DOB: 05/16/1990 DOA: 07/26/2017 PCP: Patient, No Pcp Per   Outpatient Specialists: Dr Collene Mares   Brief Narrative:  Pt. with PMH of type I DM, gastroparesis, depression, chronic abdominal pain, right buttock abscess; admitted on 07/26/2017, presented with complaint of nausea and vomiting, was found to have DKA. Patient has non compliance. Has recurrent hospitalization.  Assessment & Plan:   Principal Problem:   DKA (diabetic ketoacidoses) ([place]) Active Problems:   Gastroparesis   Nausea and vomiting   1. DKA: Resolved. Continue SQ insulin. Patient is non-complioant with Insulin. Counseling provided.  2. Interactable NV: suspected induction of vomiting by patient but also history of Gastroparesis. Dr Collene Mares has dismissed patient from her practice.  3. HTN: Uncontrolled. Now better.  4. Hypokalemia: Will replete.PO and IV route  5. Rectal Abscess: Completed antibiotics. Follow up outpatient with Dr Elmo Putt.  6. Abdominal Pain: Chronic, Continue supportive care.   DVT prophylaxis: Heparin Code Status: Full Family Communication: None at bedside Disposition Plan: Home   Consultants:   None  Procedures:   None  Antimicrobials:  None  Subjective: Patient still having vomiting but observed to be inducing vomiting by Nurse aide.  Objective: Vitals:   07/28/17 1442 07/28/17 1640 07/28/17 2104 07/29/17 0515  BP: 94/62 112/74 (!) 123/95 (!) 154/94  Pulse:   (!) 102 (!) 102  Resp:   18 15  Temp:   (!) 97.5 F (36.4 C) 97.6 F (36.4 C)  TempSrc:      SpO2:   100% 100%  Weight:       No intake or output data in the 24 hours ending 07/29/17 0815 Filed Weights   07/28/17 1328  Weight: 63.6 kg (140 lb 3.2 oz)    Examination:  General exam: Appears calm and comfortable  Respiratory system: Clear to auscultation. Respiratory effort  normal. Cardiovascular system: S1 & S2 heard, RRR. No JVD, murmurs, rubs, gallops or clicks. No pedal edema. Gastrointestinal system: Abdomen is nondistended, soft and nontender. No organomegaly or masses felt. Normal bowel sounds heard. Central nervous system: Alert and oriented. No focal neurological deficits. Extremities: Symmetric 5 x 5 power. Skin: No rashes, lesions or ulcers Psychiatry: Judgement and insight appear normal. Mood & affect appropriate.     Data Reviewed: I have personally reviewed following labs and imaging studies  CBC: Recent Labs  Lab 07/23/17 1223 07/23/17 1806 07/25/17 1253 07/27/17 0130 07/28/17 0813  WBC 4.3  --  3.0* 4.5 5.0  NEUTROABS  --   --   --  3.8 3.4  HGB 13.2 9.5* 11.7* 12.2 11.4*  HCT 38.8 28.0* 34.7* 34.8* 32.4*  MCV 87.0  --  86.5 84.9 85.5  PLT 263  --  230 220 737   Basic Metabolic Panel: Recent Labs  Lab 07/26/17 2330 07/27/17 0407 07/27/17 0847 07/28/17 0813 07/29/17 0635  NA 141 143 144 137 141  K 3.8 3.2* 3.3* 3.1* 2.6*  CL 101 109 111 98* 100*  CO2 19* 23 25 24 28   GLUCOSE 378* 238* 131* 341* 88  BUN 12 14 15 20  24*  CREATININE 0.66 0.53 0.44 0.71 0.55  CALCIUM 9.9 8.4* 8.6* 9.4 9.3   GFR: Estimated Creatinine Clearance: 102.7 mL/min (by C-G formula based on SCr of 0.55 mg/dL). Liver Function Tests: Recent Labs  Lab 07/26/17 2330  AST 31  ALT 47  ALKPHOS 108  BILITOT 1.2  PROT 9.0*  ALBUMIN 4.4   Recent Labs  Lab 07/26/17 2330  LIPASE 19   No results for input(s): AMMONIA in the last 168 hours. Coagulation Profile: No results for input(s): INR, PROTIME in the last 168 hours. Cardiac Enzymes: Recent Labs  Lab 07/27/17 0407  TROPONINI <0.03   BNP (last 3 results) No results for input(s): PROBNP in the last 8760 hours. HbA1C: No results for input(s): HGBA1C in the last 72 hours. CBG: Recent Labs  Lab 07/28/17 1107 07/28/17 1641 07/28/17 1707 07/28/17 2105 07/29/17 0754  GLUCAP 215* 37* 85  115* 105*   Lipid Profile: No results for input(s): CHOL, HDL, LDLCALC, TRIG, CHOLHDL, LDLDIRECT in the last 72 hours. Thyroid Function Tests: No results for input(s): TSH, T4TOTAL, FREET4, T3FREE, THYROIDAB in the last 72 hours. Anemia Panel: No results for input(s): VITAMINB12, FOLATE, FERRITIN, TIBC, IRON, RETICCTPCT in the last 72 hours. Urine analysis:    Component Value Date/Time   COLORURINE YELLOW 07/27/2017 0107   APPEARANCEUR CLEAR 07/27/2017 0107   LABSPEC 1.023 07/27/2017 0107   PHURINE 6.0 07/27/2017 0107   GLUCOSEU >=500 (A) 07/27/2017 0107   HGBUR SMALL (A) 07/27/2017 0107   BILIRUBINUR NEGATIVE 07/27/2017 0107   BILIRUBINUR negative 01/02/2015 1717   KETONESUR 80 (A) 07/27/2017 0107   PROTEINUR 100 (A) 07/27/2017 0107   UROBILINOGEN 0.2 01/13/2015 0223   NITRITE NEGATIVE 07/27/2017 0107   LEUKOCYTESUR NEGATIVE 07/27/2017 0107   Sepsis Labs: @LABRCNTIP (procalcitonin:4,lacticidven:4)  )No results found for this or any previous visit (from the past 240 hour(s)).       Radiology Studies: No results found.      Scheduled Meds: . cholestyramine light  4 g Oral 3 times per day  . citalopram  10 mg Oral Daily  . DULoxetine  20 mg Oral Daily  . feeding supplement (GLUCERNA SHAKE)  237 mL Oral TID BM  . ferrous sulfate  325 mg Oral BID WC  . gabapentin  300 mg Oral TID  . insulin aspart  0-9 Units Subcutaneous TID WC  . insulin aspart  3 Units Subcutaneous TID WC  . insulin glargine  8 Units Subcutaneous Daily  . ketorolac  30 mg Intravenous Q6H  . lisinopril  5 mg Oral Daily  . metoCLOPramide  5 mg Oral TID AC  . multivitamin with minerals  1 tablet Oral Daily  . potassium chloride  40 mEq Oral Q4H   Continuous Infusions:   LOS: 2 days    Time spent: 19 minutes    Saleema Weppler,LAWAL, MD [facility] Pager [phone]  If 7PM-7AM, please contact night-coverage [email or web address] Password [email or web address] 07/29/2017, 8:15 AM

## 2017-07-29 NOTE — Progress Notes (Signed)
Patient continues to go back and forth into bathroom, cutting on shower and getting in shower despite being told she does not have an order. Room and bathroom are extremely warm due to patient keeping hot shower running. BP has been elevated both times patient had checked via monitor but patient has been in hot shower. Patient checked manually once back in bed and BP is 152/90. Patient having nausea and medicated with Zofran and resting quietly at this time. Will ct monitor.

## 2017-07-29 NOTE — Telephone Encounter (Signed)
I spoke with [name] case manager from [facility]. I informed her that we could not send requested labs because we never had drawn any labs on her. She has had many multiple ER visits & those were last labs drawn. I stated that she was only seen here on 2/07 & we were also not patient's PCP.

## 2017-07-29 NOTE — Progress Notes (Signed)
Patient refusing all PO medications.  MD notified.

## 2017-07-29 NOTE — Progress Notes (Signed)
Patient states she vomitted. Vomit was unwitnessed. When Tech walked in, tech witnessed patient standing over the toilet inserting index finger in patients mouth repeated. Patient declined any assistance. Nurse Notified

## 2017-07-29 NOTE — Progress Notes (Signed)
NT came and told this RN that when she walked into the patient's room she saw the patient with her finger down her throat making herself gag. The patient then called this RN requesting her benadryl be changed to IV because she thinks she vomited it up.  MD notified via text page.

## 2017-07-29 NOTE — Progress Notes (Signed)
CRITICAL VALUE ALERT  Critical Value:  K+ 2.6  Date & Time Notied:  07/29/17  Provider Notified: yes, via text page  Orders Received/Actions taken:

## 2017-07-30 LAB — BASIC METABOLIC PANEL
Anion gap: 15 (ref 5–15)
BUN: 23 mg/dL — ABNORMAL HIGH (ref 6–20)
CO2: 28 mmol/L (ref 22–32)
Calcium: 8.9 mg/dL (ref 8.9–10.3)
Chloride: 100 mmol/L — ABNORMAL LOW (ref 101–111)
Creatinine, Ser: 0.48 mg/dL (ref 0.44–1.00)
GFR calc Af Amer: >60 mL/min (ref >60)
GFR calc non Af Amer: >60 mL/min (ref >60)
Glucose, Bld: 148 mg/dL — ABNORMAL HIGH (ref 65–99)
Potassium: 2.6 mmol/L — CL (ref 3.5–5.1)
Sodium: 143 mmol/L (ref 135–145)

## 2017-07-30 LAB — GLUCOSE, CAPILLARY
Glucose-Capillary: 48 mg/dL — ABNORMAL LOW (ref 65–99)
Glucose-Capillary: 165 mg/dL — ABNORMAL HIGH (ref 65–99)
Glucose-Capillary: 137 mg/dL — ABNORMAL HIGH (ref 65–99)
Glucose-Capillary: 121 mg/dL — ABNORMAL HIGH (ref 65–99)
Glucose-Capillary: 36 mg/dL — CL (ref 65–99)
Glucose-Capillary: 49 mg/dL — ABNORMAL LOW (ref 65–99)
Glucose-Capillary: 74 mg/dL (ref 65–99)
Glucose-Capillary: 305 mg/dL — ABNORMAL HIGH (ref 65–99)

## 2017-07-30 MED ORDER — LORAZEPAM 2 MG/ML IJ SOLN
1.0000 mg | INTRAMUSCULAR | Status: DC | PRN
  Administered 2017-07-30 – 2017-08-01 (×3): 1 mg via INTRAVENOUS
  Filled 2017-07-30 (×3): qty 1

## 2017-07-30 MED ORDER — DEXTROSE 50 % IV SOLN
25.0000 g | Freq: Once | INTRAVENOUS | Status: AC
  Administered 2017-07-30: 50 mL via INTRAVENOUS

## 2017-07-30 MED ORDER — POTASSIUM CHLORIDE 10 MEQ/100ML IV SOLN
10.0000 meq | INTRAVENOUS | Status: AC
  Administered 2017-07-30 (×3): 10 meq via INTRAVENOUS
  Filled 2017-07-30 (×3): qty 100

## 2017-07-30 MED ORDER — DEXTROSE 50 % IV SOLN
INTRAVENOUS | Status: AC
  Administered 2017-07-30: 50 mL via INTRAVENOUS
  Filled 2017-07-30: qty 50

## 2017-07-30 NOTE — Progress Notes (Signed)
CRITICAL VALUE ALERT  Critical Value:  k- 2.6  Date & Time Notied:  5/2 0732  Provider Notified: MD paged [phone] 5/2  Orders Received/Actions taken: Awaiting orders

## 2017-07-30 NOTE — Progress Notes (Signed)
Patient reports she feels like CBG is low. CBG checked and is 48. D50 given per instructions. Provider on call Dr. Katrinka Blazing notified. Patient tolerated well. Patient is alert and oriented x4, sipping water. Manual BP checked 142/80. Pt given her am dose of Toradol. Given cool cloth and has heating pad in place. Will recheck CBG and c/t monitor.

## 2017-07-30 NOTE — Progress Notes (Signed)
Hypoglycemic Event  CBG: 36  Treatment: carb snack  Symptoms: weakness  Follow-up CBG: Time: 1649 CBG Result: 49  Possible Reasons for Event:  Poor PO intake  Comments/MD notified: yes via text page    Demetrio Lapping

## 2017-07-30 NOTE — Progress Notes (Signed)
Hypoglycemic Event  CBG: 49  Treatment: dinner  Symptoms: weakness  Follow-up CBG: Time:1718 CBG Result:74  Possible Reasons for Event: Poor PO intake  Comments/MD notified:yes via text page    Demetrio Lapping

## 2017-07-30 NOTE — Progress Notes (Signed)
Inpatient Diabetes Program Recommendations  AACE/ADA: New Consensus Statement on Inpatient Glycemic Control (2015)  Target Ranges:  Prepandial:   less than 140 mg/dL      Peak postprandial:   less than 180 mg/dL (1-2 hours)      Critically ill patients:  140 - 180 mg/dL   Results for [name], [name]" (MRN [id number]) as of 07/30/2017 08:57  Ref. Range 07/28/2017 07:58 07/28/2017 11:07 07/28/2017 16:41 07/28/2017 17:07 07/28/2017 21:05  Glucose-Capillary Latest Ref Range: 65 - 99 mg/dL 446 (H)  7 units NOVOLOG  215 (H)  3 units NOVOLOG +  12 units LANTUS  37 (LL) 85 115 (H)   Results for [name], [name] (MRN [id number]) as of 07/30/2017 08:57  Ref. Range 07/29/2017 07:54 07/29/2017 11:56 07/29/2017 16:35 07/29/2017 22:44  Glucose-Capillary Latest Ref Range: 65 - 99 mg/dL 051 (H) 833 (H)  8 units LANTUS 85 75   Results for [name], [name] (MRN [id number]) as of 07/30/2017 08:57  Ref. Range 07/30/2017 04:53 07/30/2017 05:55 07/30/2017 07:56  Glucose-Capillary Latest Ref Range: 65 - 99 mg/dL 48 (L) 210 (H) 312 (H)    Home DM Meds: Lantus 12 units daily        Humalog 0-9 units TID with meals  Current Orders: Lantus 8 units daily      Novolog Sensitive Correction Scale/ SSI (0-9 units) TID AC      Novolog 3 units TID with meals     Patient received 8 units Lantus yesterday at 10:35am.  No Novolog given yesterday.  CBGs did not require Novolog SSI and patient did not eat well enough to receive Novolog Meal Coverage.  Hypoglycemic at 4:53am today.     MD- Please consider reducing Lantus slightly to 6 units daily     --Will follow patient during hospitalization--  Ambrose Finland RN, MSN, CDE Diabetes Coordinator Inpatient Glycemic Control Team Team Pager: [phone] (8a-5p)

## 2017-07-30 NOTE — Progress Notes (Signed)
Patient ID: [name], female   DOB: Jul 18, 1990, 27 y.o.   MRN: [id number]  PROGRESS NOTE    [name]  JZP:[id number] DOB: 1990/09/01 DOA: 07/26/2017 PCP: Patient, No Pcp Per   Outpatient Specialists: Dr Collene Mares   Brief Narrative:  Pt. with PMH of type I DM, gastroparesis, depression, chronic abdominal pain, right buttock abscess; admitted on 07/26/2017, presented with complaint of nausea and vomiting, was found to have DKA. Patient has non compliance. Has recurrent hospitalization.  Assessment & Plan:   Principal Problem:   DKA (diabetic ketoacidoses) ([place]) Active Problems:   Gastroparesis   Nausea and vomiting   1. DKA: Resolved. Has episodes of Hypoglycemia due to poor oral intake. Continue SQ insulin. Patient is non-compliant with home Insulin. Need adjustment.  Counseling provided.  2. Interactable NV: suspected induction of vomiting by patient but also history of Gastroparesis. Dr Collene Mares has dismissed patient from her practice. Discussed with patient the need to follow up at [facility] center Gastroparesis service. Given some Ativan PRN that helped before. No Narcotics.  3. HTN: Uncontrolled. Now better.  4. Hypokalemia: Will continue to replete.PO and IV route  5. Rectal Abscess: Completed antibiotics. Follow up outpatient with Dr Elmo Putt.  6. Abdominal Pain: Chronic, Continue supportive care.   DVT prophylaxis: Heparin Code Status: Full Family Communication: None at bedside Disposition Plan: Home   Consultants:   None  Procedures:   None  Antimicrobials:  None  Subjective: Patient still having vomiting. Has Diarrhea as well. Has profound Hypokalemia. Not able to keep anyhting down.  Objective: Vitals:   07/29/17 2300 07/30/17 0456 07/30/17 0456 07/30/17 0515  BP: (!) 152/90  (!) 171/113 (!) 142/80  Pulse:  (!) 103 (!) 103   Resp:      Temp:  98.1 F (36.7 C) 98.1 F (36.7 C)   TempSrc:  Oral Oral   SpO2:  100% 100%   Weight:         Intake/Output Summary (Last 24 hours) at 07/30/2017 0939 Last data filed at 07/29/2017 2109 Gross per 24 hour  Intake 30 ml  Output -  Net 30 ml   Filed Weights   07/28/17 1328  Weight: 63.6 kg (140 lb 3.2 oz)    Examination:  General exam: Appears calm and comfortable  Respiratory system: Clear to auscultation. Respiratory effort normal. Cardiovascular system: S1 & S2 heard, RRR. No JVD, murmurs, rubs, gallops or clicks. No pedal edema. Gastrointestinal system: Abdomen is nondistended, soft and nontender. No organomegaly or masses felt. Normal bowel sounds heard. Central nervous system: Alert and oriented. No focal neurological deficits. Extremities: Symmetric 5 x 5 power. Skin: No rashes, lesions or ulcers Psychiatry: Judgement and insight appear normal. Mood & affect appropriate.     Data Reviewed: I have personally reviewed following labs and imaging studies  CBC: Recent Labs  Lab 07/23/17 1223 07/23/17 1806 07/25/17 1253 07/27/17 0130 07/28/17 0813  WBC 4.3  --  3.0* 4.5 5.0  NEUTROABS  --   --   --  3.8 3.4  HGB 13.2 9.5* 11.7* 12.2 11.4*  HCT 38.8 28.0* 34.7* 34.8* 32.4*  MCV 87.0  --  86.5 84.9 85.5  PLT 263  --  230 220 480   Basic Metabolic Panel: Recent Labs  Lab 07/27/17 0407 07/27/17 0847 07/28/17 0813 07/29/17 0635 07/30/17 0635  NA 143 144 137 141 143  K 3.2* 3.3* 3.1* 2.6* 2.6*  CL 109 111 98* 100* 100*  CO2 23 25 24  28  28  GLUCOSE 238* 131* 341* 88 148*  BUN 14 15 20  24* 23*  CREATININE 0.53 0.44 0.71 0.55 0.48  CALCIUM 8.4* 8.6* 9.4 9.3 8.9   GFR: Estimated Creatinine Clearance: 102.7 mL/min (by C-G formula based on SCr of 0.48 mg/dL). Liver Function Tests: Recent Labs  Lab 07/26/17 2330  AST 31  ALT 47  ALKPHOS 108  BILITOT 1.2  PROT 9.0*  ALBUMIN 4.4   Recent Labs  Lab 07/26/17 2330  LIPASE 19   No results for input(s): AMMONIA in the last 168 hours. Coagulation Profile: No results for input(s): INR, PROTIME in the  last 168 hours. Cardiac Enzymes: Recent Labs  Lab 07/27/17 0407  TROPONINI <0.03   BNP (last 3 results) No results for input(s): PROBNP in the last 8760 hours. HbA1C: No results for input(s): HGBA1C in the last 72 hours. CBG: Recent Labs  Lab 07/29/17 1635 07/29/17 2244 07/30/17 0453 07/30/17 0555 07/30/17 0756  GLUCAP 85 75 48* 165* 137*   Lipid Profile: No results for input(s): CHOL, HDL, LDLCALC, TRIG, CHOLHDL, LDLDIRECT in the last 72 hours. Thyroid Function Tests: No results for input(s): TSH, T4TOTAL, FREET4, T3FREE, THYROIDAB in the last 72 hours. Anemia Panel: No results for input(s): VITAMINB12, FOLATE, FERRITIN, TIBC, IRON, RETICCTPCT in the last 72 hours. Urine analysis:    Component Value Date/Time   COLORURINE YELLOW 07/27/2017 0107   APPEARANCEUR CLEAR 07/27/2017 0107   LABSPEC 1.023 07/27/2017 0107   PHURINE 6.0 07/27/2017 0107   GLUCOSEU >=500 (A) 07/27/2017 0107   HGBUR SMALL (A) 07/27/2017 0107   BILIRUBINUR NEGATIVE 07/27/2017 0107   BILIRUBINUR negative 01/02/2015 1717   KETONESUR 80 (A) 07/27/2017 0107   PROTEINUR 100 (A) 07/27/2017 0107   UROBILINOGEN 0.2 01/13/2015 0223   NITRITE NEGATIVE 07/27/2017 0107   LEUKOCYTESUR NEGATIVE 07/27/2017 0107   Sepsis Labs: @LABRCNTIP (procalcitonin:4,lacticidven:4)  )No results found for this or any previous visit (from the past 240 hour(s)).       Radiology Studies: No results found.      Scheduled Meds: . cholestyramine light  4 g Oral 3 times per day  . citalopram  10 mg Oral Daily  . DULoxetine  20 mg Oral Daily  . feeding supplement (GLUCERNA SHAKE)  237 mL Oral TID BM  . ferrous sulfate  325 mg Oral BID WC  . gabapentin  300 mg Oral TID  . insulin aspart  0-9 Units Subcutaneous TID WC  . insulin aspart  3 Units Subcutaneous TID WC  . insulin glargine  8 Units Subcutaneous Daily  . ketorolac  30 mg Intravenous Q6H  . lisinopril  5 mg Oral Daily  . metoCLOPramide  5 mg Oral TID AC  .  multivitamin with minerals  1 tablet Oral Daily   Continuous Infusions: . potassium chloride       LOS: 3 days    Time spent: 30 minutes    GARBA,LAWAL, MD [facility] Pager [phone]  If 7PM-7AM, please contact night-coverage [email or web address] Password [facility] 07/30/2017, 9:39 AM

## 2017-07-31 DIAGNOSIS — G43A1 Cyclical vomiting, intractable: Secondary | ICD-10-CM

## 2017-07-31 DIAGNOSIS — E1010 Type 1 diabetes mellitus with ketoacidosis without coma: Principal | ICD-10-CM

## 2017-07-31 DIAGNOSIS — K3184 Gastroparesis: Secondary | ICD-10-CM

## 2017-07-31 DIAGNOSIS — I10 Essential (primary) hypertension: Secondary | ICD-10-CM

## 2017-07-31 DIAGNOSIS — E876 Hypokalemia: Secondary | ICD-10-CM

## 2017-07-31 LAB — CBC WITH DIFFERENTIAL/PLATELET
Basophils Absolute: 0 10*3/uL (ref 0.0–0.1)
Basophils Relative: 0 %
Eosinophils Absolute: 0.1 10*3/uL (ref 0.0–0.7)
Eosinophils Relative: 1 %
HCT: 29.5 % — ABNORMAL LOW (ref 36.0–46.0)
Hemoglobin: 10 g/dL — ABNORMAL LOW (ref 12.0–15.0)
Lymphocytes Relative: 57 %
Lymphs Abs: 2.5 10*3/uL (ref 0.7–4.0)
MCH: 29.4 pg (ref 26.0–34.0)
MCHC: 33.9 g/dL (ref 30.0–36.0)
MCV: 86.8 fL (ref 78.0–100.0)
Monocytes Absolute: 0.3 10*3/uL (ref 0.1–1.0)
Monocytes Relative: 6 %
Neutro Abs: 1.6 10*3/uL — ABNORMAL LOW (ref 1.7–7.7)
Neutrophils Relative %: 36 %
Platelets: 227 10*3/uL (ref 150–400)
RBC: 3.4 MIL/uL — ABNORMAL LOW (ref 3.87–5.11)
RDW: 12.5 % (ref 11.5–15.5)
WBC: 4.5 10*3/uL (ref 4.0–10.5)

## 2017-07-31 LAB — BASIC METABOLIC PANEL
Anion gap: 10 (ref 5–15)
BUN: 16 mg/dL (ref 6–20)
CO2: 30 mmol/L (ref 22–32)
Calcium: 8.8 mg/dL — ABNORMAL LOW (ref 8.9–10.3)
Chloride: 97 mmol/L — ABNORMAL LOW (ref 101–111)
Creatinine, Ser: 0.67 mg/dL (ref 0.44–1.00)
GFR calc Af Amer: >60 mL/min (ref >60)
GFR calc non Af Amer: >60 mL/min (ref >60)
Glucose, Bld: 295 mg/dL — ABNORMAL HIGH (ref 65–99)
Potassium: 3.1 mmol/L — ABNORMAL LOW (ref 3.5–5.1)
Sodium: 137 mmol/L (ref 135–145)

## 2017-07-31 LAB — COMPREHENSIVE METABOLIC PANEL
ALT: 15 U/L (ref 14–54)
AST: 16 U/L (ref 15–41)
Albumin: 2.9 g/dL — ABNORMAL LOW (ref 3.5–5.0)
Alkaline Phosphatase: 70 U/L (ref 38–126)
Anion gap: 8 (ref 5–15)
BUN: 16 mg/dL (ref 6–20)
CO2: 30 mmol/L (ref 22–32)
Calcium: 8.5 mg/dL — ABNORMAL LOW (ref 8.9–10.3)
Chloride: 96 mmol/L — ABNORMAL LOW (ref 101–111)
Creatinine, Ser: 0.6 mg/dL (ref 0.44–1.00)
GFR calc Af Amer: >60 mL/min (ref >60)
GFR calc non Af Amer: >60 mL/min (ref >60)
Glucose, Bld: 291 mg/dL — ABNORMAL HIGH (ref 65–99)
Potassium: 3.1 mmol/L — ABNORMAL LOW (ref 3.5–5.1)
Sodium: 134 mmol/L — ABNORMAL LOW (ref 135–145)
Total Bilirubin: 0.7 mg/dL (ref 0.3–1.2)
Total Protein: 6.2 g/dL — ABNORMAL LOW (ref 6.5–8.1)

## 2017-07-31 LAB — GLUCOSE, CAPILLARY
Glucose-Capillary: 145 mg/dL — ABNORMAL HIGH (ref 65–99)
Glucose-Capillary: 128 mg/dL — ABNORMAL HIGH (ref 65–99)
Glucose-Capillary: 108 mg/dL — ABNORMAL HIGH (ref 65–99)
Glucose-Capillary: 192 mg/dL — ABNORMAL HIGH (ref 65–99)

## 2017-07-31 LAB — MAGNESIUM: Magnesium: 1.8 mg/dL (ref 1.7–2.4)

## 2017-07-31 MED ORDER — POTASSIUM CHLORIDE 10 MEQ/100ML IV SOLN
10.0000 meq | INTRAVENOUS | Status: AC
  Administered 2017-07-31 (×6): 10 meq via INTRAVENOUS
  Filled 2017-07-31 (×6): qty 100

## 2017-07-31 NOTE — Progress Notes (Signed)
PROGRESS NOTE    [name]  CVE:[id number] DOB: [birth year] DOA: 07/26/2017 PCP: Patient, No Pcp Per   Brief Narrative:  HPI On 07/27/2017 by Dr. Gean Birchwood [name] is a 27 y.o. female with history of diabetes mellitus type 1 with history of gastroparesis and depression who was recently admitted 3 weeks ago for right buttock abscess and nausea vomiting presents to the ER with complaints of persistent nausea vomiting and diarrhea last 3 days.  Also has been having some epigastric discomfort.  Denies any blood in the vomitus or diarrhea.  Patient was on antibiotics until last week.  Patient states she has been compliant with her Lantus insulin but was not on any sliding scale coverage due to having persistent nausea. During the stay in March 2019 last month patient had colonoscopy which was completely unremarkable. Patient during the last December stay and was placed on cholestyramine and Creon for severe malnutrition and diarrhea.  Interim history Admitted for DKA which is not resolved.  Had nausea, vomiting, diarrhea, she appears to now be improving.  Patient has been able to tolerate carb modified diet.  Currently replacing electrolytes. Assessment & Plan   Diabetic ketoacidosis -Required insulin drip, has currently transitioned to Lantus with insulin sliding scale and CBG monitoring -Hemoglobin A1c 5.7 on 04/10/2017  Chronic abdominal pain with nausea, vomiting, diarrhea -Suspect secondary to gastroparesis due to uncontrolled diabetes -Of note, patient could have cyclic vomiting syndrome due to marijuana abuse -Continue cholestyramine, Reglan  Essential hypertension -Better controlled -Continue lisinopril, IV hydralazine as needed  Hypokalemia -Potassium currently 3.1, will replace with 6 rounds of IV potassium as patient states she cannot tolerate oral form -checked magnesium, 1.8 -Continue to monitor BMP  Right buttock abscess -Recently completed antibiotics and  follows with general surgery  Depression -Continue Cymbalta, Celexa  Chronic normocytic anemia -Baseline hemoglobin approximately 10-11, currently 10 -Continue iron supplementation  Drug abuse -Drug screen positive for opiates, THC -Counseled   DVT Prophylaxis  SCDs  Code Status: Full  Family Communication: None at bedside  Disposition Plan: admitted, suspect discharge to home in 24 hours pending potassium and improvement  Consultants None  Procedures  None  Antibiotics   Anti-infectives (From admission, onward)   None      Subjective:   [name] seen and examined today.  Patient able to tolerate carb modified diet without further nausea or vomiting.  Denies current chest pain, shortness of breath, abdominal pain, dizziness or headache.  Objective:   Vitals:   07/30/17 0515 07/30/17 1532 07/30/17 2239 07/31/17 0422  BP: (!) 142/80 (!) 131/94 (!) 154/91 115/78  Pulse:  100 97 94  Resp:  (!) 22 18 16   Temp:  98.7 F (37.1 C) 98.6 F (37 C) 99.2 F (37.3 C)  TempSrc:  Oral Oral   SpO2:  100% 100% 100%  Weight:       No intake or output data in the 24 hours ending 07/31/17 1430 Filed Weights   07/28/17 1328  Weight: 63.6 kg (140 lb 3.2 oz)    Exam  General: Well developed, well nourished, NAD, appears stated age  HEENT: NCAT, mucous membranes moist.   Neck: Supple  Cardiovascular: S1 S2 auscultated, no rubs, murmurs or gallops. Regular rate and rhythm.  Respiratory: Clear to auscultation bilaterally with equal chest rise  Abdomen: Soft, nontender, nondistended, + bowel sounds  Extremities: warm dry without cyanosis clubbing or edema  Neuro: AAOx3, nonfocal  Psych: Normal affect and demeanor  Data Reviewed: I have personally reviewed following labs and imaging studies  CBC: Recent Labs  Lab 07/25/17 1253 07/27/17 0130 07/28/17 0813 07/31/17 0707  WBC 3.0* 4.5 5.0 4.5  NEUTROABS  --  3.8 3.4 1.6*  HGB 11.7* 12.2 11.4* 10.0*    HCT 34.7* 34.8* 32.4* 29.5*  MCV 86.5 84.9 85.5 86.8  PLT 230 220 253 505   Basic Metabolic Panel: Recent Labs  Lab 07/28/17 0813 07/29/17 0635 07/30/17 0635 07/31/17 0626 07/31/17 0707 07/31/17 0729  NA 137 141 143 137 134*  --   K 3.1* 2.6* 2.6* 3.1* 3.1*  --   CL 98* 100* 100* 97* 96*  --   CO2 24 28 28 30 30   --   GLUCOSE 341* 88 148* 295* 291*  --   BUN 20 24* 23* 16 16  --   CREATININE 0.71 0.55 0.48 0.67 0.60  --   CALCIUM 9.4 9.3 8.9 8.8* 8.5*  --   MG  --   --   --   --   --  1.8   GFR: Estimated Creatinine Clearance: 102.7 mL/min (by C-G formula based on SCr of 0.6 mg/dL). Liver Function Tests: Recent Labs  Lab 07/26/17 2330 07/31/17 0707  AST 31 16  ALT 47 15  ALKPHOS 108 70  BILITOT 1.2 0.7  PROT 9.0* 6.2*  ALBUMIN 4.4 2.9*   Recent Labs  Lab 07/26/17 2330  LIPASE 19   No results for input(s): AMMONIA in the last 168 hours. Coagulation Profile: No results for input(s): INR, PROTIME in the last 168 hours. Cardiac Enzymes: Recent Labs  Lab 07/27/17 0407  TROPONINI <0.03   BNP (last 3 results) No results for input(s): PROBNP in the last 8760 hours. HbA1C: No results for input(s): HGBA1C in the last 72 hours. CBG: Recent Labs  Lab 07/30/17 1649 07/30/17 1718 07/30/17 2242 07/31/17 0734 07/31/17 1229  GLUCAP 49* 74 305* 145* 128*   Lipid Profile: No results for input(s): CHOL, HDL, LDLCALC, TRIG, CHOLHDL, LDLDIRECT in the last 72 hours. Thyroid Function Tests: No results for input(s): TSH, T4TOTAL, FREET4, T3FREE, THYROIDAB in the last 72 hours. Anemia Panel: No results for input(s): VITAMINB12, FOLATE, FERRITIN, TIBC, IRON, RETICCTPCT in the last 72 hours. Urine analysis:    Component Value Date/Time   COLORURINE YELLOW 07/27/2017 0107   APPEARANCEUR CLEAR 07/27/2017 0107   LABSPEC 1.023 07/27/2017 0107   PHURINE 6.0 07/27/2017 0107   GLUCOSEU >=500 (A) 07/27/2017 0107   HGBUR SMALL (A) 07/27/2017 0107   BILIRUBINUR NEGATIVE  07/27/2017 0107   BILIRUBINUR negative 01/02/2015 1717   KETONESUR 80 (A) 07/27/2017 0107   PROTEINUR 100 (A) 07/27/2017 0107   UROBILINOGEN 0.2 01/13/2015 0223   NITRITE NEGATIVE 07/27/2017 0107   LEUKOCYTESUR NEGATIVE 07/27/2017 0107   Sepsis Labs: @LABRCNTIP (procalcitonin:4,lacticidven:4)  )No results found for this or any previous visit (from the past 240 hour(s)).    Radiology Studies: No results found.   Scheduled Meds: . cholestyramine light  4 g Oral 3 times per day  . citalopram  10 mg Oral Daily  . DULoxetine  20 mg Oral Daily  . feeding supplement (GLUCERNA SHAKE)  237 mL Oral TID BM  . ferrous sulfate  325 mg Oral BID WC  . gabapentin  300 mg Oral TID  . insulin aspart  0-9 Units Subcutaneous TID WC  . insulin aspart  3 Units Subcutaneous TID WC  . insulin glargine  8 Units Subcutaneous Daily  . ketorolac  30 mg  Intravenous Q6H  . lisinopril  5 mg Oral Daily  . metoCLOPramide  5 mg Oral TID AC  . multivitamin with minerals  1 tablet Oral Daily   Continuous Infusions: . potassium chloride 10 mEq (07/31/17 1346)     LOS: 4 days   Time Spent in minutes   30 minutes  Garlon Tuggle D.O. on 07/31/2017 at 2:30 PM  Between 7am to 7pm - Pager - [phone]  After 7pm go to [email or web address] - password [email or web address]  And look for the night coverage person covering for me after hours  [facility] Office  [phone]

## 2017-07-31 NOTE — Care Management Note (Signed)
Case Management Note  Patient Details  Name: ELNORE COSTER MRN: [id number] Date of Birth: 01/08/1991  Subjective/Objective:                  Abd. Pain and weakness, hypokalemia  Action/Plan: Date: Jul 31, 2017 Marcelle Smiling, BSN, [place], [place] [phone] Chart and notes review for patient progress and needs. Will follow for case management and discharge needs. No cm or discharge needs present at time of this review. Next review date: [id number]  Expected Discharge Date:  (unknown)               Expected Discharge Plan:  Home/Self Care  In-House Referral:     Discharge planning Services  CM Consult  Post Acute Care Choice:    Choice offered to:     DME Arranged:    DME Agency:     HH Arranged:    HH Agency:     Status of Service:  In process, will continue to follow  If discussed at Long Length of Stay Meetings, dates discussed:    Additional Comments:  Golda Acre, RN 07/31/2017, 9:38 AM

## 2017-08-01 DIAGNOSIS — F329 Major depressive disorder, single episode, unspecified: Secondary | ICD-10-CM

## 2017-08-01 DIAGNOSIS — D649 Anemia, unspecified: Secondary | ICD-10-CM

## 2017-08-01 LAB — BASIC METABOLIC PANEL
Anion gap: 7 (ref 5–15)
BUN: 20 mg/dL (ref 6–20)
CO2: 28 mmol/L (ref 22–32)
Calcium: 9.2 mg/dL (ref 8.9–10.3)
Chloride: 100 mmol/L — ABNORMAL LOW (ref 101–111)
Creatinine, Ser: 0.61 mg/dL (ref 0.44–1.00)
GFR calc Af Amer: >60 mL/min (ref >60)
GFR calc non Af Amer: >60 mL/min (ref >60)
Glucose, Bld: 280 mg/dL — ABNORMAL HIGH (ref 65–99)
Potassium: 4.2 mmol/L (ref 3.5–5.1)
Sodium: 135 mmol/L (ref 135–145)

## 2017-08-01 LAB — GLUCOSE, CAPILLARY
Glucose-Capillary: 280 mg/dL — ABNORMAL HIGH (ref 65–99)
Glucose-Capillary: 203 mg/dL — ABNORMAL HIGH (ref 65–99)

## 2017-08-01 MED ORDER — GLUCERNA SHAKE PO LIQD: 237.0000 mL | Freq: Three times a day (TID) | ORAL | 0 refills | Status: DC

## 2017-08-01 MED ORDER — DICLOFENAC SODIUM 1 % TD GEL
2.0000 g | Freq: Four times a day (QID) | TRANSDERMAL | Status: DC
  Administered 2017-08-01: 2 g via TOPICAL
  Filled 2017-08-01: qty 100

## 2017-08-01 MED ORDER — GI COCKTAIL ~~LOC~~: 30.0000 mL | Freq: Three times a day (TID) | ORAL | 0 refills | Status: DC | PRN

## 2017-08-01 MED ORDER — DICLOFENAC SODIUM 1 % TD GEL: 2.0000 g | Freq: Four times a day (QID) | TRANSDERMAL | 0 refills | Status: DC

## 2017-08-01 MED ORDER — LISINOPRIL 5 MG PO TABS: 5.0000 mg | ORAL_TABLET | Freq: Every day | ORAL | 0 refills | Status: DC

## 2017-08-01 NOTE — Discharge Summary (Signed)
Physician Discharge Summary  [name] MVH:[id number] DOB: [birth year] DOA: 07/26/2017  PCP: Patient, No Pcp Per  Admit date: 07/26/2017 Discharge date: 08/01/2017  Time spent: 45 minutes  Recommendations for Outpatient Follow-up:  Patient will be discharged to home.  Patient will need to follow up with primary care provider within one week of discharge.  Follow up with Dr. Buddy Duty and gastroenterology. Patient should continue medications as prescribed.  Patient should follow a carb modified diet.   Discharge Diagnoses:  Diabetic ketoacidosis Chronic abdominal pain with nausea, vomiting, diarrhea Essential hypertension Hypokalemia Right buttock abscess Depression Chronic normocytic anemia Drug abuse  Discharge Condition: Stable  Diet recommendation: carb modified  Filed Weights   07/28/17 1328  Weight: 63.6 kg (140 lb 3.2 oz)    History of present illness:  On 07/27/2017 by Dr. London Pepper [name] a 27 y.o.femalewithhistory of diabetes mellitus type 1 with history of gastroparesis and depression who was recently admitted 3 weeks ago for right buttock abscess and nausea vomiting presents to the ER with complaints of persistent nausea vomiting and diarrhea last 3 days. Also has been having some epigastric discomfort. Denies any blood in the vomitus or diarrhea. Patient was on antibiotics until last week. Patient states she has been compliant with her Lantus insulin but was not on any sliding scale coverage due to having persistent nausea. During the stay in March 2019 last month patient had colonoscopy which was completely unremarkable. Patient during the last December stay and was placed on cholestyramine and Creon for severe malnutrition and diarrhea.  Hospital Course:  Diabetic ketoacidosis -Required insulin drip, has currently transitioned to Lantus with insulin sliding scale and CBG monitoring -Hemoglobin A1c 5.7 on 04/10/2017 -follow up with Dr.  Buddy Duty  Chronic abdominal pain with nausea, vomiting, diarrhea -Suspect secondary to gastroparesis due to uncontrolled diabetes -Of note, patient could have cyclic vomiting syndrome due to marijuana abuse -Continue cholestyramine, Reglan -encouraged patient to follow up with gasatroenterology   Essential hypertension -Better controlled -Continue lisinopril, lasix  Hypokalemia -Potassium currently 4.2 after replacement -checked magnesium, 1.8 -Continue to monitor BMP  Right buttock abscess -Recently completed antibiotics and follows with general surgery  Depression -Continue Cymbalta, Celexa  Chronic normocytic anemia -Baseline hemoglobin approximately 10-11, currently 10 -Continue iron supplementation  Drug abuse -Drug screen positive for opiates, THC -Counseled   Right arm pain -Patient complained of right arm pain however was sleeping on her arm.  Have ordered some Voltaren gel  Procedures: none  Consultations: none  Discharge Exam:  08/01/17 0523  BP: 137/88  Pulse: 99  Resp: 18  Temp: 97.9 F (36.6 C)  SpO2: 99%   Denies current abdominal pain, nausea or vomiting.  Denies chest pain, shortness of breath, dizziness or headache.   General: Well developed, well nourished, NAD, appears stated age  HEENT: NCAT, mucous membranes moist.  Neck: Supple  Cardiovascular: S1 S2 auscultated, no rubs, murmurs or gallops. Regular rate and rhythm.  Respiratory: Clear to auscultation bilaterally with equal chest rise  Abdomen: Soft, nontender, nondistended, + bowel sounds  Extremities: warm dry without cyanosis clubbing or edema  Neuro: AAOx3, nonfocal  Psych: Normal affect and demeanor  Discharge Instructions Discharge Instructions    Discharge instructions   Complete by:  As directed    Patient will be discharged to home.  Patient will need to follow up with primary care provider within one week of discharge.  Follow up with Dr. Buddy Duty and  gastroenterology. Patient should continue medications as  prescribed.  Patient should follow a carb modified diet.     Allergies as of 08/01/2017      Reactions   Other Anaphylaxis   Reaction to [place] nuts   Lactose Intolerance (gi) Diarrhea      Medication List    STOP taking these medications   HYDROcodone-acetaminophen 5-325 MG tablet Commonly known as:  NORCO/VICODIN   ondansetron 4 MG tablet Commonly known as:  ZOFRAN     TAKE these medications   acetaminophen 500 MG tablet Commonly known as:  TYLENOL Take 1,000 mg by mouth daily as needed for moderate pain.   calcium carbonate 500 MG chewable tablet Commonly known as:  TUMS - dosed in mg elemental calcium Chew 1 tablet (200 mg of elemental calcium total) by mouth 3 (three) times daily.   cholestyramine light 4 g packet Commonly known as:  PREVALITE Take 1 packet (4 g total) by mouth 3 (three) times daily. What changed:    when to take this  reasons to take this   citalopram 20 MG tablet Commonly known as:  CELEXA Take 1 tablet (20 mg total) by mouth daily.   cyclobenzaprine 10 MG tablet Commonly known as:  FLEXERIL Take 1 tablet (10 mg total) by mouth 2 (two) times daily as needed for muscle spasms.   diclofenac sodium 1 % Gel Commonly known as:  VOLTAREN Apply 2 g topically 4 (four) times daily.   famotidine 40 MG tablet Commonly known as:  PEPCID Take 1 tablet (40 mg total) by mouth 2 (two) times daily.   feeding supplement (GLUCERNA SHAKE) Liqd Take 237 mLs by mouth 3 (three) times daily between meals.   ferrous sulfate 325 (65 FE) MG tablet Take 1 tablet (325 mg total) by mouth 2 (two) times daily with a meal.   furosemide 40 MG tablet Commonly known as:  LASIX Take 40 mg by mouth daily as needed for fluid.   gabapentin 300 MG capsule Commonly known as:  NEURONTIN Take 1 capsule (300 mg total) by mouth 2 (two) times daily.   gi cocktail Susp suspension Take 30 mLs by mouth 3 (three) times  daily as needed for indigestion. Shake well.   glucose 4 GM chewable tablet Chew 1 tablet (4 g total) by mouth as needed for low blood sugar.   insulin lispro 100 UNIT/ML injection Commonly known as:  HUMALOG Inject 0.02 mLs (2 Units total) into the skin 3 (three) times daily with meals. What changed:    how much to take  additional instructions   lactobacillus Pack Take 1 packet (1 g total) by mouth 3 (three) times daily with meals.   LANTUS 100 UNIT/ML injection Generic drug:  insulin glargine Inject 0.12 mLs (12 Units total) into the skin daily.   lidocaine 2 % solution Commonly known as:  XYLOCAINE Use as directed 15 mLs in the mouth or throat every 6 (six) hours as needed (stomach pain).   lidocaine 5 % Commonly known as:  LIDODERM Place 1 patch onto the skin daily. Remove & Discard patch within 12 hours or as directed by MD What changed:    when to take this  reasons to take this  additional instructions   lipase/protease/amylase 36000 UNITS Cpep capsule Commonly known as:  CREON Take 1 capsule (36,000 Units total) by mouth 3 (three) times daily with meals.   lisinopril 5 MG tablet Commonly known as:  PRINIVIL,ZESTRIL Take 1 tablet (5 mg total) by mouth daily. Start taking on:  08/02/2017  metoCLOPramide 5 MG tablet Commonly known as:  REGLAN Take 5 mg by mouth every 6 (six) hours as needed for nausea or vomiting.   multivitamin with minerals Tabs tablet Take 1 tablet by mouth daily.   ondansetron 4 MG disintegrating tablet Commonly known as:  ZOFRAN ODT Take 1 tablet (4 mg total) by mouth every 8 (eight) hours as needed for nausea or vomiting.   sucralfate 1 GM/10ML suspension Commonly known as:  CARAFATE Take 10 mLs (1 g total) by mouth 4 (four) times daily -  with meals and at bedtime.      Allergies  Allergen Reactions  . Other Anaphylaxis    Reaction to [place] nuts   . Lactose Intolerance (Gi) Diarrhea   Follow-up Information    Primary  care physician. Schedule an appointment as soon as possible for a visit in 1 week(s).   Why:  Hospital follow up       Delrae Rend, MD. Schedule an appointment as soon as possible for a visit in 1 week(s).   Specialty:  Endocrinology Why:  Hospital follow up Contact information: [address] [place] [address] [phone]            The results of significant diagnostics from this hospitalization (including imaging, microbiology, ancillary and laboratory) are listed below for reference.    Significant Diagnostic Studies: Dg Chest 2 View  Result Date: 07/23/2017 CLINICAL DATA:  Chest pain EXAM: CHEST - 2 VIEW COMPARISON:  Chest radiograph 07/10/2017 FINDINGS: The heart size and mediastinal contours are within normal limits. Both lungs are clear. The visualized skeletal structures are unremarkable. IMPRESSION: Normal chest. Electronically Signed   By: Ulyses Jarred M.D.   On: 07/23/2017 17:41   Dg Chest 2 View  Result Date: 07/10/2017 CLINICAL DATA:  Nausea, chest pain, shortness of breath. EXAM: CHEST - 2 VIEW COMPARISON:  Chest CT 05/27/2017 FINDINGS: The cardiomediastinal contours are normal. The lungs are clear. Pulmonary vasculature is normal. No consolidation, pleural effusion, or pneumothorax. No acute osseous abnormalities are seen. IMPRESSION: Normal radiographs of the chest. Electronically Signed   By: Jeb Levering M.D.   On: 07/10/2017 00:26   Dg Lumbar Spine Complete  Result Date: 07/23/2017 CLINICAL DATA:  Per order- back pain Pt states she is having lower mid line back pain today. Pt states no injury to back. EXAM: LUMBAR SPINE - COMPLETE 4+ VIEW COMPARISON:  CT of the abdomen and pelvis on 06/19/2017 FINDINGS: There is no evidence of lumbar spine fracture. Alignment is normal. Intervertebral disc spaces are maintained. Surgical clips are present in the RIGHT UPPER QUADRANT the abdomen. IMPRESSION: Negative. Electronically Signed   By: Nolon Nations M.D.   On: 07/23/2017 19:27   Ct Pelvis W Contrast  Result Date: 07/09/2017 CLINICAL DATA:  Gluteal abscess.  Drain in place. EXAM: CT PELVIS WITH CONTRAST TECHNIQUE: Multidetector CT imaging of the pelvis was performed using the standard protocol following the bolus administration of intravenous contrast. CONTRAST:  46m ISOVUE-300 IOPAMIDOL (ISOVUE-300) INJECTION 61% COMPARISON:  Abdominopelvic CT 06/19/2017 FINDINGS: Urinary Tract: Distal ureters are decompressed. Urinary bladder is mildly distended. No bladder wall thickening. Bowel: Colonic wall thickening on prior exam has diminished in the interim, with persistent sigmoid and rectal wall thickening. No evidence of acute bowel inflammation. Vascular/Lymphatic: Few prominent inguinal nodes are likely reactive. No acute vascular findings. Reproductive:  Uterus and adnexa are unremarkable. Other: Right gluteal drainage catheter is unchanged in position. Unchanged appearance of the adjacent soft  tissue density without dominant fluid collection. Soft tissue fullness tracks in the gluteal crease to the anal junction without significant no evidence of new fluid collection. No significant change from prior exam or progressive inflammatory change. No intrapelvic fluid collection or abscess. Musculoskeletal: There are no acute or suspicious osseous abnormalities. IMPRESSION: 1. Right gluteal drainage catheter in place, unchanged in appearance with associated soft tissue density in the subcutaneous tissues tracking to the anorectal region, suspicious for anocutaneous fistula. Overall no significant change from prior. No increased inflammation or new fluid collection. 2. Persistent but improved wall thickening of the rectosigmoid colon. Electronically Signed   By: Jeb Levering M.D.   On: 07/09/2017 05:15    Microbiology: No results found for this or any previous visit (from the past 240 hour(s)).   Labs: Basic Metabolic Panel: Recent Labs  Lab  07/29/17 0635 07/30/17 8841 07/31/17 0626 07/31/17 0707 07/31/17 0729 08/01/17 0553  NA 141 143 137 134*  --  135  K 2.6* 2.6* 3.1* 3.1*  --  4.2  CL 100* 100* 97* 96*  --  100*  CO2 28 28 30 30   --  28  GLUCOSE 88 148* 295* 291*  --  280*  BUN 24* 23* 16 16  --  20  CREATININE 0.55 0.48 0.67 0.60  --  0.61  CALCIUM 9.3 8.9 8.8* 8.5*  --  9.2  MG  --   --   --   --  1.8  --    Liver Function Tests: Recent Labs  Lab 07/26/17 2330 07/31/17 0707  AST 31 16  ALT 47 15  ALKPHOS 108 70  BILITOT 1.2 0.7  PROT 9.0* 6.2*  ALBUMIN 4.4 2.9*   Recent Labs  Lab 07/26/17 2330  LIPASE 19   No results for input(s): AMMONIA in the last 168 hours. CBC: Recent Labs  Lab 07/25/17 1253 07/27/17 0130 07/28/17 0813 07/31/17 0707  WBC 3.0* 4.5 5.0 4.5  NEUTROABS  --  3.8 3.4 1.6*  HGB 11.7* 12.2 11.4* 10.0*  HCT 34.7* 34.8* 32.4* 29.5*  MCV 86.5 84.9 85.5 86.8  PLT 230 220 253 227   Cardiac Enzymes: Recent Labs  Lab 07/27/17 0407  TROPONINI <0.03   BNP: BNP (last 3 results) No results for input(s): BNP in the last 8760 hours.  ProBNP (last 3 results) No results for input(s): PROBNP in the last 8760 hours.  CBG: Recent Labs  Lab 07/31/17 1229 07/31/17 1658 07/31/17 2155 08/01/17 0732 08/01/17 1119  GLUCAP 128* 108* 192* 280* 203*       Signed:  [facility] 08/01/2017, 12:15 PM

## 2017-08-01 NOTE — Discharge Instructions (Signed)
Diabetic Ketoacidosis °Diabetic ketoacidosis is a life-threatening complication of diabetes. If it is not treated, it can cause severe dehydration and organ damage and can lead to a coma or death. °What are the causes? °This condition develops when there is not enough of the hormone insulin in the body. Insulin helps the body to break down sugar for energy. Without insulin, the body cannot break down sugar, so it breaks down fats instead. This leads to the production of acids that are called ketones. Ketones are poisonous at high levels. °This condition can be triggered by: °· Stress on the body that is brought on by an illness. °· Medicines that raise blood glucose levels. °· Not taking diabetes medicine. ° °What are the signs or symptoms? °Symptoms of this condition include: °· Fatigue. °· Weight loss. °· Excessive thirst. °· Light-headedness. °· Fruity or sweet-smelling breath. °· Excessive urination. °· Vision changes. °· Confusion or irritability. °· Nausea. °· Vomiting. °· Rapid breathing. °· Abdominal pain. °· Feeling flushed. ° °How is this diagnosed? °This condition is diagnosed based on a medical history, a physical exam, and blood tests. You may also have a urine test that checks for ketones. °How is this treated? °This condition may be treated with: °· Fluid replacement. This may be done to correct dehydration. °· Insulin injections. These may be given through the skin or through an IV tube. °· Electrolyte replacement. Electrolytes, such as potassium and sodium, may be given in pill form or through an IV tube. °· Antibiotic medicines. These may be prescribed if your condition was caused by an infection. ° °Follow these instructions at home: °Eating and drinking °· Drink enough fluids to keep your urine clear or pale yellow. °· If you cannot eat, alternate between drinking fluids with sugar (such as juice) and salty fluids (such as broth or bouillon). °· If you can eat, follow your usual diet and drink  sugar-free liquids, such as water. °Other Instructions ° °· Take insulin as directed by your health care provider. Do not skip insulin injections. Do not use expired insulin. °· If your blood sugar is over 240 mg/dL, monitor your urine ketones every 4-6 hours. °· If you were prescribed an antibiotic medicine, finish all of it even if you start to feel better. °· Rest and exercise only as directed by your health care provider. °· If you get sick, call your health care provider and begin treatment quickly. Your body often needs extra insulin to fight an illness. °· Check your blood glucose levels regularly. If your blood glucose is high, drink plenty of fluids. This helps to flush out ketones. °Contact a health care provider if: °· Your blood glucose level is too high or too low. °· You have ketones in your urine. °· You have a fever. °· You cannot eat. °· You cannot tolerate fluids. °· You have been vomiting for more than 2 hours. °· You continue to have symptoms of this condition. °· You develop new symptoms. °Get help right away if: °· Your blood glucose levels continue to be high (elevated). °· Your monitor reads “high” even when you are taking insulin. °· You faint. °· You have chest pain. °· You have trouble breathing. °· You have a sudden, severe headache. °· You have sudden weakness in one arm or one leg. °· You have sudden trouble speaking or swallowing. °· You have vomiting or diarrhea that gets worse after 3 hours. °· You feel severely fatigued. °· You have trouble thinking. °· You   have abdominal pain. °· You are severely dehydrated. Symptoms of severe dehydration include: °? Extreme thirst. °? Dry mouth. °? Blue lips. °? Cold hands and feet. °? Rapid breathing. °This information is not intended to replace advice given to you by your health care provider. Make sure you discuss any questions you have with your health care provider. °Document Released: 03/14/2000 Document Revised: 08/23/2015 Document  Reviewed: 02/22/2014 °Elsevier Interactive Patient Education © 2017 [employer] ° °

## 2017-08-01 NOTE — Progress Notes (Signed)
Discharge instructions and medications discussed with patient.  Prescription and AVS given to patient.  All questions answered.  

## 2017-08-04 ENCOUNTER — Emergency Department (HOSPITAL_COMMUNITY)
Admission: EM | Admit: 2017-08-04 | Discharge: 2017-08-05 | Discharge status: Against Medical Advice | Payer: 59 | Attending: Emergency Medicine | Admitting: Emergency Medicine

## 2017-08-04 DIAGNOSIS — M79605 Pain in left leg: Secondary | ICD-10-CM | POA: Diagnosis not present

## 2017-08-04 DIAGNOSIS — Z5321 Procedure and treatment not carried out due to patient leaving prior to being seen by health care provider: Secondary | ICD-10-CM | POA: Insufficient documentation

## 2017-08-05 ENCOUNTER — Encounter: Payer: Self-pay | Admitting: Gastroenterology

## 2017-08-05 ENCOUNTER — Other Ambulatory Visit: Payer: Self-pay

## 2017-08-05 ENCOUNTER — Encounter (HOSPITAL_COMMUNITY): Payer: Self-pay | Admitting: Emergency Medicine

## 2017-08-05 NOTE — ED Triage Notes (Signed)
Pt reports having left leg pain that has been ongoing for the last day. Pt states pain goes from left hip to ankle.

## 2017-08-16 ENCOUNTER — Other Ambulatory Visit: Payer: Self-pay

## 2017-08-16 ENCOUNTER — Emergency Department (HOSPITAL_COMMUNITY)
Admission: EM | Admit: 2017-08-16 | Discharge: 2017-08-16 | Disposition: A | Discharge status: Home / Self Care | Payer: 59 | Attending: Emergency Medicine | Admitting: Emergency Medicine

## 2017-08-16 ENCOUNTER — Encounter (HOSPITAL_COMMUNITY): Payer: Self-pay

## 2017-08-16 DIAGNOSIS — E109 Type 1 diabetes mellitus without complications: Secondary | ICD-10-CM | POA: Insufficient documentation

## 2017-08-16 DIAGNOSIS — I10 Essential (primary) hypertension: Secondary | ICD-10-CM | POA: Diagnosis not present

## 2017-08-16 DIAGNOSIS — Z794 Long term (current) use of insulin: Secondary | ICD-10-CM | POA: Insufficient documentation

## 2017-08-16 DIAGNOSIS — Z79899 Other long term (current) drug therapy: Secondary | ICD-10-CM | POA: Diagnosis not present

## 2017-08-16 DIAGNOSIS — K3184 Gastroparesis: Secondary | ICD-10-CM | POA: Insufficient documentation

## 2017-08-16 DIAGNOSIS — R112 Nausea with vomiting, unspecified: Secondary | ICD-10-CM | POA: Diagnosis present

## 2017-08-16 LAB — CBC WITH DIFFERENTIAL/PLATELET
Basophils Absolute: 0 10*3/uL (ref 0.0–0.1)
Basophils Relative: 0 %
Eosinophils Absolute: 0.1 10*3/uL (ref 0.0–0.7)
Eosinophils Relative: 3 %
HCT: 33.6 % — ABNORMAL LOW (ref 36.0–46.0)
Hemoglobin: 11.4 g/dL — ABNORMAL LOW (ref 12.0–15.0)
Lymphocytes Relative: 45 %
Lymphs Abs: 1.5 10*3/uL (ref 0.7–4.0)
MCH: 30 pg (ref 26.0–34.0)
MCHC: 33.9 g/dL (ref 30.0–36.0)
MCV: 88.4 fL (ref 78.0–100.0)
Monocytes Absolute: 0.2 10*3/uL (ref 0.1–1.0)
Monocytes Relative: 7 %
Neutro Abs: 1.5 10*3/uL — ABNORMAL LOW (ref 1.7–7.7)
Neutrophils Relative %: 45 %
Platelets: 191 10*3/uL (ref 150–400)
RBC: 3.8 MIL/uL — ABNORMAL LOW (ref 3.87–5.11)
RDW: 12.3 % (ref 11.5–15.5)
WBC: 3.3 10*3/uL — ABNORMAL LOW (ref 4.0–10.5)

## 2017-08-16 LAB — COMPREHENSIVE METABOLIC PANEL
ALT: 26 U/L (ref 14–54)
AST: 27 U/L (ref 15–41)
Albumin: 3.8 g/dL (ref 3.5–5.0)
Alkaline Phosphatase: 70 U/L (ref 38–126)
Anion gap: 12 (ref 5–15)
BUN: 13 mg/dL (ref 6–20)
CO2: 27 mmol/L (ref 22–32)
Calcium: 9.5 mg/dL (ref 8.9–10.3)
Chloride: 102 mmol/L (ref 101–111)
Creatinine, Ser: 0.5 mg/dL (ref 0.44–1.00)
GFR calc Af Amer: >60 mL/min (ref >60)
GFR calc non Af Amer: >60 mL/min (ref >60)
Glucose, Bld: 90 mg/dL (ref 65–99)
Potassium: 3.4 mmol/L — ABNORMAL LOW (ref 3.5–5.1)
Sodium: 141 mmol/L (ref 135–145)
Total Bilirubin: 0.6 mg/dL (ref 0.3–1.2)
Total Protein: 8 g/dL (ref 6.5–8.1)

## 2017-08-16 LAB — URINALYSIS, ROUTINE W REFLEX MICROSCOPIC
Bilirubin Urine: NEGATIVE
Glucose, UA: 150 mg/dL — AB
Ketones, ur: NEGATIVE mg/dL
Leukocytes, UA: NEGATIVE
Nitrite: NEGATIVE
Protein, ur: 30 mg/dL — AB
Specific Gravity, Urine: 1.017 (ref 1.005–1.030)
pH: 6 (ref 5.0–8.0)

## 2017-08-16 LAB — PREGNANCY, URINE: Preg Test, Ur: NEGATIVE

## 2017-08-16 MED ORDER — METOCLOPRAMIDE HCL 5 MG/ML IJ SOLN
10.0000 mg | Freq: Once | INTRAMUSCULAR | Status: AC
  Administered 2017-08-16: 10 mg via INTRAVENOUS
  Filled 2017-08-16: qty 2

## 2017-08-16 MED ORDER — LORAZEPAM 2 MG/ML IJ SOLN
1.0000 mg | Freq: Once | INTRAMUSCULAR | Status: AC
  Administered 2017-08-16: 1 mg via INTRAVENOUS
  Filled 2017-08-16: qty 1

## 2017-08-16 MED ORDER — SODIUM CHLORIDE 0.9 % IV BOLUS: 1000.0000 mL | Freq: Once | INTRAVENOUS | Status: DC

## 2017-08-16 MED ORDER — SODIUM CHLORIDE 0.9 % IV BOLUS
1000.0000 mL | Freq: Once | INTRAVENOUS | Status: AC
Start: 2017-08-16 — End: 2017-08-16
  Administered 2017-08-16: 1000 mL via INTRAVENOUS

## 2017-08-16 MED ORDER — ONDANSETRON HCL 4 MG/2ML IJ SOLN: 4.0000 mg | Freq: Once | INTRAMUSCULAR | Status: DC

## 2017-08-16 NOTE — ED Notes (Signed)
Notified PA Thayer Ohm that pt sts that she is ready to go home and feels better

## 2017-08-16 NOTE — ED Triage Notes (Signed)
Pt coming from home c/o N/V/D as well as chest and back pain that started 24 hours ago. 4 episodes of emesis and 4 of diarrhea.

## 2017-08-16 NOTE — ED Provider Notes (Signed)
[facility]-EMERGENCY DEPT Provider Note   CSN: [id number] Arrival date & time: 08/16/17  5621     History   Chief Complaint Chief Complaint  Patient presents with  . Nausea  . Emesis  . Diarrhea    HPI [name] is a 27 y.o. female.  HPI Patient presents to the emergency department with nausea vomiting diarrhea.  She states she has a history of gastroparesis and this feels like an attack of gastroparesis.  Patient states that she feels like she is dehydrated as well.  Nothing seems make the condition better or worse.  The patient denies chest pain, shortness of breath, headache,blurred vision, neck pain, fever, cough, weakness, numbness, dizziness, anorexia, edema, abdominal pain,  rash, back pain, dysuria, hematemesis, bloody stool, near syncope, or syncope. Past Medical History:  Diagnosis Date  . Anxiety   . Diabetes type 1, uncontrolled (HCC) 11/14/2011   Since age 65  . Fibromyalgia   . Gastroparesis   . Hypertension   . Infection    UTI April 2016    Patient Active Problem List   Diagnosis Date Noted  . DKA (diabetic ketoacidoses) (HCC) 07/27/2017  . Normocytic anemia 07/10/2017  . SIRS (systemic inflammatory response syndrome) (HCC) 06/23/2017  . Hypokalemia 06/23/2017  . Normocytic normochromic anemia 06/23/2017  . GERD (gastroesophageal reflux disease) 06/23/2017  . Intractable nausea and vomiting 04/11/2017  . Nausea and vomiting 04/10/2017  . Essential hypertension   . Colitis   . Abscess and cellulitis of gluteal region 03/24/2017  . Cellulitis and abscess of buttock 03/23/2017  . Malnutrition of moderate degree 03/19/2017  . Anasarca 03/16/2017  . 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 Diarrhea 02/27/2017  . Lower GI bleed 02/27/2017  . DKA (diabetic ketoacidosis) (HCC) 02/26/2017  . Acute metabolic encephalopathy   . DKA, type 1 (HCC) 06/11/2013  . Orthostatic hypotension dysautonomic  syndrome (HCC) 04/28/2013  . Body aches 04/28/2013  . Orthostatic hypotension 04/28/2013  . Diabetes mellitus type 1 (HCC) 04/28/2013  . Protein-calorie malnutrition, severe (HCC) 04/28/2013  . Noncompliance with diabetes treatment 09/01/2012  . Dyspnea 02/28/2012  . Sinus tachycardia 02/28/2012  . Hyperglycemia without ketosis 02/28/2012  . Chest pain 12/04/2011  . Insulin dependent diabetes mellitus (HCC) 11/14/2011  . Hypertension associated with diabetes (HCC) 11/14/2011  . Gastroparesis 11/14/2011  . Hyponatremia 09/19/2011  . Chronic abdominal pain 09/18/2011  . Metabolic alkalosis 09/18/2011  . Diabetic hyperosmolar non-ketotic state (HCC) 09/18/2011  . DIAB W/UNSPEC COMP TYPE I [JUV TYPE] UNCNTRL 06/04/2010  . CONSTIPATION 01/01/2010  . RECTAL PAIN 01/01/2010    Past Surgical History:  Procedure Laterality Date  . ANKLE SURGERY    . CHOLECYSTECTOMY  11/15/2011   Procedure: LAPAROSCOPIC CHOLECYSTECTOMY WITH INTRAOPERATIVE CHOLANGIOGRAM;  Surgeon: [name], MD;  Location: [facility];  Service: General;  Laterality: N/A;  . COLONOSCOPY WITH PROPOFOL N/A 06/27/2017   Procedure: COLONOSCOPY WITH PROPOFOL;  Surgeon: Rachael Fee, MD;  Location: [facility];  Service: Endoscopy;  Laterality: N/A;  . ESOPHAGOGASTRODUODENOSCOPY  12/03/2011   Procedure: ESOPHAGOGASTRODUODENOSCOPY (EGD);  Surgeon: Theda Belfast, MD;  Location: [facility];  Service: Endoscopy;  Laterality: N/A;  . FLEXIBLE SIGMOIDOSCOPY N/A 03/10/2017   Procedure: FLEXIBLE SIGMOIDOSCOPY;  Surgeon: Jeani Hawking, MD;  Location: [facility];  Service: Endoscopy;  Laterality: N/A;  . INCISION AND DRAINAGE PERIRECTAL ABSCESS N/A 03/01/2017   Procedure: IRRIGATION AND DEBRIDEMENT PERIRECTAL ABSCESS;  Surgeon: Ovidio Kin, MD;  Location: [facility];  Service: General;  Laterality: N/A;  . IRRIGATION AND DEBRIDEMENT BUTTOCKS N/A 03/23/2017   Procedure: IRRIGATION AND DEBRIDEMENT BUTTOCKS, SETON PLACEMENT;  Surgeon:  Romie Levee, MD;  Location: [facility];  Service: General;  Laterality: N/A;  . LAPAROSCOPY  11/23/2011   Procedure: LAPAROSCOPY DIAGNOSTIC;  Surgeon: Mariella Saa, MD;  Location: [facility];  Service: General;  Laterality: N/A;     OB History    Gravida  1   Para  1   Term      Preterm  1   AB      Living  1     SAB      TAB      Ectopic      Multiple  0   Live Births  1            Home Medications    Prior to Admission medications   Medication Sig Start Date End Date Taking? Authorizing Provider  acetaminophen (TYLENOL) 500 MG tablet Take 1,000 mg by mouth daily as needed for moderate pain.    Yes [provider]  alum & mag hydroxide-simeth (MAALOX ADVANCED MAX ST) 400-400-40 MG/5ML suspension Take 10 mLs by mouth every 6 (six) hours as needed for indigestion.   Yes [provider]  calcium carbonate (TUMS - DOSED IN MG ELEMENTAL CALCIUM) 500 MG chewable tablet Chew 1 tablet (200 mg of elemental calcium total) by mouth 3 (three) times daily. Patient taking differently: Chew 1 tablet by mouth 3 (three) times daily as needed for indigestion.  07/13/17  Yes Emokpae, Courage, MD  citalopram (CELEXA) 20 MG tablet Take 1 tablet (20 mg total) by mouth daily. 07/13/17  Yes Emokpae, Courage, MD  diclofenac sodium (VOLTAREN) 1 % GEL Apply 2 g topically 4 (four) times daily. 08/01/17  Yes Mikhail, [place], DO  feeding supplement, GLUCERNA SHAKE, (GLUCERNA SHAKE) LIQD Take 237 mLs by mouth 3 (three) times daily between meals. 08/01/17  Yes Mikhail, [place], DO  ferrous sulfate 325 (65 FE) MG tablet Take 1 tablet (325 mg total) by mouth 2 (two) times daily with a meal. 05/13/17  Yes Henson, Vickie L, NP-C  gabapentin (NEURONTIN) 300 MG capsule Take 1 capsule (300 mg total) by mouth 2 (two) times daily. 04/03/17  Yes Henson, Vickie L, NP-C  glucose 4 GM chewable tablet Chew 1 tablet (4 g total) by mouth as needed for low blood sugar. 07/13/17  Yes Emokpae, Courage, MD    insulin lispro (HUMALOG) 100 UNIT/ML injection Inject 0.02 mLs (2 Units total) into the skin 3 (three) times daily with meals. Patient taking differently: Inject 0-9 Units into the skin 3 (three) times daily with meals. Sliding scale 04/09/17  Yes Romero Belling, MD  LANTUS 100 UNIT/ML injection Inject 0.12 mLs (12 Units total) into the skin daily. 07/13/17  Yes Emokpae, Courage, MD  lidocaine (LIDODERM) 5 % Place 1 patch onto the skin daily. Remove & Discard patch within 12 hours or as directed by MD Patient taking differently: Place 1 patch onto the skin daily as needed (pain). Remove & Discard patch within 12 hours or as directed by MD 05/21/17  Yes Khatri, Hina, PA-C  lidocaine (XYLOCAINE) 2 % solution Use as directed 15 mLs in the mouth or throat every 6 (six) hours as needed (stomach pain). 05/31/17  Yes Cardama, Amadeo Garnet, MD  metoCLOPramide (REGLAN) 5 MG tablet Take 5 mg by mouth every 6 (six) hours as needed for nausea or vomiting.    Yes [provider]  ondansetron (ZOFRAN ODT) 4 MG disintegrating tablet Take 1 tablet (4 mg total) by mouth every 8 (eight) hours as needed for nausea or vomiting. 07/09/17  Yes Shaune Pollack, MD  Alum & Mag Hydroxide-Simeth (GI COCKTAIL) SUSP suspension Take 30 mLs by mouth 3 (three) times daily as needed for indigestion. Shake well. Patient not taking: Reported on 08/16/2017 08/01/17   Edsel Petrin, DO  cholestyramine light (PREVALITE) 4 g packet Take 1 packet (4 g total) by mouth 3 (three) times daily. Patient not taking: Reported on 08/16/2017 03/14/17   Rolly Salter, MD  cyclobenzaprine (FLEXERIL) 10 MG tablet Take 1 tablet (10 mg total) by mouth 2 (two) times daily as needed for muscle spasms. Patient not taking: Reported on 07/25/2017 05/21/17   Dietrich Pates, PA-C  famotidine (PEPCID) 40 MG tablet Take 1 tablet (40 mg total) by mouth 2 (two) times daily. Patient not taking: Reported on 07/23/2017 06/21/17   Bethel Born, PA-C   lactobacillus (FLORANEX/LACTINEX) PACK Take 1 packet (1 g total) by mouth 3 (three) times daily with meals. Patient not taking: Reported on 08/16/2017 07/13/17   Shon Hale, MD  lipase/protease/amylase (CREON) 36000 UNITS CPEP capsule Take 1 capsule (36,000 Units total) by mouth 3 (three) times daily with meals. Patient not taking: Reported on 08/16/2017 03/14/17   Rolly Salter, MD  lisinopril (PRINIVIL,ZESTRIL) 5 MG tablet Take 1 tablet (5 mg total) by mouth daily. Patient not taking: Reported on 08/16/2017 08/02/17   Edsel Petrin, DO  Multiple Vitamin (MULTIVITAMIN WITH MINERALS) TABS tablet Take 1 tablet by mouth daily. Patient not taking: Reported on 07/23/2017 07/14/17   Shon Hale, MD  sucralfate (CARAFATE) 1 GM/10ML suspension Take 10 mLs (1 g total) by mouth 4 (four) times daily -  with meals and at bedtime. Patient not taking: Reported on 08/16/2017 06/17/17   Elpidio Anis, PA-C    Family History Family History  Problem Relation Age of Onset  . Diabetes Mother   . Hypertension Father     Social History Social History   Tobacco Use  . Smoking status: Never Smoker  . Smokeless tobacco: Never Used  Substance Use Topics  . Alcohol use: No    Alcohol/week: 0.0 oz    Frequency: Never  . Drug use: No     Allergies   Other and Lactose intolerance (gi)   Review of Systems Review of Systems All other systems negative except as documented in the HPI. All pertinent positives and negatives as reviewed in the HPI. Physical Exam Updated Vital Signs BP 120/88   Pulse (!) 101   Temp 98.6 F (37 C) (Oral)   Resp 16   Ht 5\' 6"  (1.676 m)   Wt 63.5 kg (140 lb)   SpO2 100%   BMI 22.60 kg/m   Physical Exam  Constitutional: She is oriented to person, place, and time. She appears well-developed and well-nourished. No distress.  HENT:  Head: Normocephalic and atraumatic.  Mouth/Throat: Oropharynx is clear and moist.  Eyes: Pupils are equal, round, and reactive to  light.  Neck: Normal range of motion. Neck supple.  Cardiovascular: Normal rate, regular rhythm and normal heart sounds. Exam reveals no gallop and no friction rub.  No murmur heard. Pulmonary/Chest: Effort normal and breath sounds normal. No respiratory distress. She has no wheezes.  Abdominal: Soft. Bowel sounds are normal. She exhibits no distension. There is no tenderness. There is no rebound and no guarding.  Neurological: She is alert and oriented  to person, place, and time. She exhibits normal muscle tone. Coordination normal.  Skin: Skin is warm and dry. Capillary refill takes less than 2 seconds. No rash noted. No erythema.  Psychiatric: She has a normal mood and affect. Her behavior is normal.  Nursing note and vitals reviewed.    ED Treatments / Results  Labs (all labs ordered are listed, but only abnormal results are displayed) Labs Reviewed  COMPREHENSIVE METABOLIC PANEL - Abnormal; Notable for the following components:      Result Value   Potassium 3.4 (*)    All other components within normal limits  CBC WITH DIFFERENTIAL/PLATELET - Abnormal; Notable for the following components:   WBC 3.3 (*)    RBC 3.80 (*)    Hemoglobin 11.4 (*)    HCT 33.6 (*)    Neutro Abs 1.5 (*)    All other components within normal limits  URINALYSIS, ROUTINE W REFLEX MICROSCOPIC - Abnormal; Notable for the following components:   Glucose, UA 150 (*)    Hgb urine dipstick SMALL (*)    Protein, ur 30 (*)    Bacteria, UA RARE (*)    All other components within normal limits  PREGNANCY, URINE    EKG None  Radiology No results found.  Procedures Procedures (including critical care time)  Medications Ordered in ED Medications  sodium chloride 0.9 % bolus 1,000 mL (0 mLs Intravenous Stopped 08/16/17 1103)  LORazepam (ATIVAN) injection 1 mg (1 mg Intravenous Given 08/16/17 0830)  metoCLOPramide (REGLAN) injection 10 mg (10 mg Intravenous Given 08/16/17 0830)     Initial Impression /  Assessment and Plan / ED Course  I have reviewed the triage vital signs and the nursing notes.  Pertinent labs & imaging results that were available during my care of the patient were reviewed by me and considered in my medical decision making (see chart for details).    She is given IV fluids along with Ativan and Reglan and she has complete resolution of her symptoms to her baseline.  Patient is advised she will need to follow-up with her GI doctor.  Told to return for any worsening in her condition.  Patient agrees with plan and all questions were answered  Final Clinical Impressions(s) / ED Diagnoses   Final diagnoses:  Gastroparesis    ED Discharge Orders    None       Charlestine Night, PA-C 08/16/17 1644    Gerhard Munch, MD 08/19/17 [phone]

## 2017-08-16 NOTE — Discharge Instructions (Signed)
Return here as needed.  Follow-up with your GI doctor °

## 2017-09-02 ENCOUNTER — Encounter (HOSPITAL_COMMUNITY): Payer: Self-pay | Admitting: Emergency Medicine

## 2017-09-02 ENCOUNTER — Emergency Department (HOSPITAL_COMMUNITY)
Admission: EM | Admit: 2017-09-02 | Discharge: 2017-09-02 | Disposition: A | Discharge status: Home / Self Care | Payer: 59 | Attending: Emergency Medicine | Admitting: Emergency Medicine

## 2017-09-02 ENCOUNTER — Other Ambulatory Visit: Payer: Self-pay

## 2017-09-02 DIAGNOSIS — E860 Dehydration: Secondary | ICD-10-CM | POA: Insufficient documentation

## 2017-09-02 DIAGNOSIS — I10 Essential (primary) hypertension: Secondary | ICD-10-CM | POA: Insufficient documentation

## 2017-09-02 DIAGNOSIS — K3184 Gastroparesis: Secondary | ICD-10-CM | POA: Insufficient documentation

## 2017-09-02 DIAGNOSIS — R109 Unspecified abdominal pain: Secondary | ICD-10-CM | POA: Diagnosis present

## 2017-09-02 DIAGNOSIS — E1165 Type 2 diabetes mellitus with hyperglycemia: Secondary | ICD-10-CM | POA: Diagnosis not present

## 2017-09-02 DIAGNOSIS — E1043 Type 1 diabetes mellitus with diabetic autonomic (poly)neuropathy: Secondary | ICD-10-CM | POA: Diagnosis not present

## 2017-09-02 DIAGNOSIS — R1013 Epigastric pain: Secondary | ICD-10-CM

## 2017-09-02 DIAGNOSIS — R739 Hyperglycemia, unspecified: Secondary | ICD-10-CM

## 2017-09-02 DIAGNOSIS — E1143 Type 2 diabetes mellitus with diabetic autonomic (poly)neuropathy: Secondary | ICD-10-CM

## 2017-09-02 DIAGNOSIS — E1065 Type 1 diabetes mellitus with hyperglycemia: Secondary | ICD-10-CM | POA: Diagnosis not present

## 2017-09-02 LAB — CBC
HCT: 37.9 % (ref 36.0–46.0)
Hemoglobin: 13.4 g/dL (ref 12.0–15.0)
MCH: 29.8 pg (ref 26.0–34.0)
MCHC: 35.4 g/dL (ref 30.0–36.0)
MCV: 84.2 fL (ref 78.0–100.0)
Platelets: 263 10*3/uL (ref 150–400)
RBC: 4.5 MIL/uL (ref 3.87–5.11)
RDW: 11.4 % — ABNORMAL LOW (ref 11.5–15.5)
WBC: 5.1 10*3/uL (ref 4.0–10.5)

## 2017-09-02 LAB — URINALYSIS, ROUTINE W REFLEX MICROSCOPIC
Bacteria, UA: NONE SEEN
Bilirubin Urine: NEGATIVE
Glucose, UA: 150 mg/dL — AB
Ketones, ur: 20 mg/dL — AB
Leukocytes, UA: NEGATIVE
Nitrite: NEGATIVE
Protein, ur: 100 mg/dL — AB
Specific Gravity, Urine: 1.021 (ref 1.005–1.030)
pH: 6 (ref 5.0–8.0)

## 2017-09-02 LAB — COMPREHENSIVE METABOLIC PANEL
ALT: 27 U/L (ref 14–54)
AST: 23 U/L (ref 15–41)
Albumin: 4.2 g/dL (ref 3.5–5.0)
Alkaline Phosphatase: 90 U/L (ref 38–126)
Anion gap: 11 (ref 5–15)
BUN: 24 mg/dL — ABNORMAL HIGH (ref 6–20)
CO2: 29 mmol/L (ref 22–32)
Calcium: 9.7 mg/dL (ref 8.9–10.3)
Chloride: 98 mmol/L — ABNORMAL LOW (ref 101–111)
Creatinine, Ser: 0.55 mg/dL (ref 0.44–1.00)
GFR calc Af Amer: >60 mL/min (ref >60)
GFR calc non Af Amer: >60 mL/min (ref >60)
Glucose, Bld: 182 mg/dL — ABNORMAL HIGH (ref 65–99)
Potassium: 4.1 mmol/L (ref 3.5–5.1)
Sodium: 138 mmol/L (ref 135–145)
Total Bilirubin: 0.5 mg/dL (ref 0.3–1.2)
Total Protein: 8.5 g/dL — ABNORMAL HIGH (ref 6.5–8.1)

## 2017-09-02 LAB — I-STAT BETA HCG BLOOD, ED (MC, WL, AP ONLY): I-stat hCG, quantitative: <5 m[IU]/mL (ref <5)

## 2017-09-02 LAB — LIPASE, BLOOD: Lipase: 26 U/L (ref 11–51)

## 2017-09-02 LAB — PREGNANCY, URINE: Preg Test, Ur: NEGATIVE

## 2017-09-02 MED ORDER — MORPHINE SULFATE (PF) 4 MG/ML IV SOLN
4.0000 mg | Freq: Once | INTRAVENOUS | Status: AC
  Administered 2017-09-02: 4 mg via INTRAVENOUS
  Filled 2017-09-02: qty 1

## 2017-09-02 MED ORDER — GI COCKTAIL ~~LOC~~
30.0000 mL | Freq: Once | ORAL | Status: AC
  Administered 2017-09-02: 30 mL via ORAL
  Filled 2017-09-02: qty 30

## 2017-09-02 MED ORDER — ONDANSETRON HCL 4 MG/2ML IJ SOLN
4.0000 mg | Freq: Once | INTRAMUSCULAR | Status: AC
  Administered 2017-09-02: 4 mg via INTRAVENOUS
  Filled 2017-09-02: qty 2

## 2017-09-02 MED ORDER — FAMOTIDINE IN NACL 20-0.9 MG/50ML-% IV SOLN
20.0000 mg | Freq: Once | INTRAVENOUS | Status: AC
Start: 2017-09-02 — End: 2017-09-02
  Administered 2017-09-02: 20 mg via INTRAVENOUS
  Filled 2017-09-02: qty 50

## 2017-09-02 MED ORDER — SODIUM CHLORIDE 0.9 % IV BOLUS
1000.0000 mL | Freq: Once | INTRAVENOUS | Status: AC
  Administered 2017-09-02: 1000 mL via INTRAVENOUS

## 2017-09-02 MED ORDER — LORAZEPAM 2 MG/ML IJ SOLN
1.0000 mg | Freq: Once | INTRAMUSCULAR | Status: AC
  Administered 2017-09-02: 1 mg via INTRAVENOUS
  Filled 2017-09-02: qty 1

## 2017-09-02 MED ORDER — METOCLOPRAMIDE HCL 10 MG PO TABS: 10.0000 mg | ORAL_TABLET | Freq: Four times a day (QID) | ORAL | 0 refills | Status: DC | PRN

## 2017-09-02 MED ORDER — FAMOTIDINE 20 MG PO TABS: 20.0000 mg | ORAL_TABLET | Freq: Two times a day (BID) | ORAL | 0 refills | Status: DC

## 2017-09-02 NOTE — ED Provider Notes (Signed)
[facility] DEPT Provider Note   CSN: [id number] Arrival date & time: 09/02/17  1531     History   Chief Complaint Chief Complaint  Patient presents with  . Abdominal Pain  . Hypotension    HPI [name] is a 27 y.o. female.  Pt presents to the ED today with abdominal pain.  She has a hx of periodic abdominal complaints.  She has an appt on 7/5 at [facility] for evaluation.  She was here on 5/19 for the same.  She said she's been well since that visit.  These sx started yesterday.      Past Medical History:  Diagnosis Date  . Anxiety   . Diabetes type 1, uncontrolled ([place]) 11/14/2011   Since age 56  . Fibromyalgia   . Gastroparesis   . Hypertension   . Infection    UTI April 2016    Patient Active Problem List   Diagnosis Date Noted  . DKA (diabetic ketoacidoses) ([place]) 07/27/2017  . Normocytic anemia 07/10/2017  . SIRS (systemic inflammatory response syndrome) ([place]) 06/23/2017  . Hypokalemia 06/23/2017  . Normocytic normochromic anemia 06/23/2017  . GERD (gastroesophageal reflux disease) 06/23/2017  . Intractable nausea and vomiting 04/11/2017  . Nausea and vomiting 04/10/2017  . Essential hypertension   . Colitis   . Abscess and cellulitis of gluteal region 03/24/2017  . Cellulitis and abscess of buttock 03/23/2017  . Malnutrition of moderate degree 03/19/2017  . Anasarca 03/16/2017  . 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 Diarrhea 02/27/2017  . Lower GI bleed 02/27/2017  . DKA (diabetic ketoacidosis) ([place]) 02/26/2017  . Acute metabolic encephalopathy   . DKA, type 1 ([place]) 06/11/2013  . Orthostatic hypotension dysautonomic syndrome ([place]) 04/28/2013  . Body aches 04/28/2013  . Orthostatic hypotension 04/28/2013  . Diabetes mellitus type 1 ([place]) 04/28/2013  . Protein-calorie malnutrition, severe ([place]) 04/28/2013  . Noncompliance with diabetes treatment 09/01/2012  . Dyspnea  02/28/2012  . Sinus tachycardia 02/28/2012  . Hyperglycemia without ketosis 02/28/2012  . Chest pain 12/04/2011  . Insulin dependent diabetes mellitus ([place]) 11/14/2011  . Hypertension associated with diabetes ([place]) 11/14/2011  . Gastroparesis 11/14/2011  . Hyponatremia 09/19/2011  . Chronic abdominal pain 09/18/2011  . Metabolic alkalosis 96/29/5284  . Diabetic hyperosmolar non-ketotic state ([place]) 09/18/2011  . DIAB W/UNSPEC COMP TYPE I [JUV TYPE] UNCNTRL 06/04/2010  . CONSTIPATION 01/01/2010  . RECTAL PAIN 01/01/2010    Past Surgical History:  Procedure Laterality Date  . ANKLE SURGERY    . CHOLECYSTECTOMY  11/15/2011   Procedure: LAPAROSCOPIC CHOLECYSTECTOMY WITH INTRAOPERATIVE CHOLANGIOGRAM;  Surgeon: [name], MD;  Location: [facility];  Service: General;  Laterality: N/A;  . COLONOSCOPY WITH PROPOFOL N/A 06/27/2017   Procedure: COLONOSCOPY WITH PROPOFOL;  Surgeon: Milus Banister, MD;  Location: [facility];  Service: Endoscopy;  Laterality: N/A;  . ESOPHAGOGASTRODUODENOSCOPY  12/03/2011   Procedure: ESOPHAGOGASTRODUODENOSCOPY (EGD);  Surgeon: Beryle Beams, MD;  Location: [facility];  Service: Endoscopy;  Laterality: N/A;  . FLEXIBLE SIGMOIDOSCOPY N/A 03/10/2017   Procedure: FLEXIBLE SIGMOIDOSCOPY;  Surgeon: Carol Ada, MD;  Location: [facility];  Service: Endoscopy;  Laterality: N/A;  . INCISION AND DRAINAGE PERIRECTAL ABSCESS N/A 03/01/2017   Procedure: IRRIGATION AND DEBRIDEMENT PERIRECTAL ABSCESS;  Surgeon: Alphonsa Overall, MD;  Location: [facility];  Service: General;  Laterality: N/A;  . IRRIGATION AND DEBRIDEMENT BUTTOCKS N/A 03/23/2017   Procedure: IRRIGATION AND DEBRIDEMENT BUTTOCKS, SETON PLACEMENT;  Surgeon: [name], MD;  Location: [facility];  Service: General;  Laterality: N/A;  . LAPAROSCOPY  11/23/2011   Procedure: LAPAROSCOPY DIAGNOSTIC;  Surgeon: Edward Jolly, MD;  Location: [facility];  Service: General;  Laterality: N/A;     OB History    Gravida    1   Para  1   Term      Preterm  1   AB      Living  1     SAB      TAB      Ectopic      Multiple  0   Live Births  1            Home Medications    Prior to Admission medications   Medication Sig Start Date End Date Taking? Authorizing Provider  acetaminophen (TYLENOL) 500 MG tablet Take 1,000 mg by mouth daily as needed for moderate pain.    Yes [provider]  alum & mag hydroxide-simeth (MAALOX ADVANCED MAX ST) [phone] MG/5ML suspension Take 10 mLs by mouth every 6 (six) hours as needed for indigestion.   Yes [provider]  citalopram (CELEXA) 20 MG tablet Take 1 tablet (20 mg total) by mouth daily. 07/13/17  Yes Emokpae, Courage, MD  diclofenac sodium (VOLTAREN) 1 % GEL Apply 2 g topically 4 (four) times daily. 08/01/17  Yes Mikhail, [place], DO  ferrous sulfate 325 (65 FE) MG tablet Take 1 tablet (325 mg total) by mouth 2 (two) times daily with a meal. 05/13/17  Yes Henson, Vickie L, NP-C  insulin lispro (HUMALOG) 100 UNIT/ML injection Inject 0.02 mLs (2 Units total) into the skin 3 (three) times daily with meals. Patient taking differently: Inject 0-9 Units into the skin 3 (three) times daily with meals. Sliding scale 04/09/17  Yes Renato Shin, MD  LANTUS 100 UNIT/ML injection Inject 0.12 mLs (12 Units total) into the skin daily. 07/13/17  Yes Emokpae, Courage, MD  sucralfate (CARAFATE) 1 GM/10ML suspension Take 10 mLs (1 g total) by mouth 4 (four) times daily -  with meals and at bedtime. 06/17/17  Yes Upstill, Nehemiah Settle, PA-C  Alum & Mag Hydroxide-Simeth (GI COCKTAIL) SUSP suspension Take 30 mLs by mouth 3 (three) times daily as needed for indigestion. Shake well. Patient not taking: Reported on 09/02/2017 08/01/17   Cristal Ford, DO  calcium carbonate (TUMS - DOSED IN MG ELEMENTAL CALCIUM) 500 MG chewable tablet Chew 1 tablet (200 mg of elemental calcium total) by mouth 3 (three) times daily. Patient not taking: Reported on 09/02/2017 07/13/17    Roxan Hockey, MD  cholestyramine light (PREVALITE) 4 g packet Take 1 packet (4 g total) by mouth 3 (three) times daily. Patient not taking: Reported on 09/02/2017 03/14/17   Lavina Hamman, MD  cyclobenzaprine (FLEXERIL) 10 MG tablet Take 1 tablet (10 mg total) by mouth 2 (two) times daily as needed for muscle spasms. Patient not taking: Reported on 07/25/2017 05/21/17   Delia Heady, PA-C  famotidine (PEPCID) 20 MG tablet Take 1 tablet (20 mg total) by mouth 2 (two) times daily. 09/02/17   Isla Pence, MD  feeding supplement, GLUCERNA SHAKE, (GLUCERNA SHAKE) LIQD Take 237 mLs by mouth 3 (three) times daily between meals. Patient not taking: Reported on 09/02/2017 08/01/17   Cristal Ford, DO  gabapentin (NEURONTIN) 300 MG capsule Take 1 capsule (300 mg total) by mouth 2 (two) times daily. Patient not taking: Reported on 09/02/2017 04/03/17   Harland Dingwall L, NP-C  glucose 4 GM chewable tablet Chew  1 tablet (4 g total) by mouth as needed for low blood sugar. Patient not taking: Reported on 09/02/2017 07/13/17   Roxan Hockey, MD  lactobacillus (FLORANEX/LACTINEX) PACK Take 1 packet (1 g total) by mouth 3 (three) times daily with meals. Patient not taking: Reported on 08/16/2017 07/13/17   Roxan Hockey, MD  lidocaine (LIDODERM) 5 % Place 1 patch onto the skin daily. Remove & Discard patch within 12 hours or as directed by MD Patient not taking: Reported on 09/02/2017 05/21/17   Delia Heady, PA-C  lidocaine (XYLOCAINE) 2 % solution Use as directed 15 mLs in the mouth or throat every 6 (six) hours as needed (stomach pain). Patient not taking: Reported on 09/02/2017 05/31/17   Fatima Blank, MD  lipase/protease/amylase (CREON) 36000 UNITS CPEP capsule Take 1 capsule (36,000 Units total) by mouth 3 (three) times daily with meals. Patient not taking: Reported on 09/02/2017 03/14/17   Lavina Hamman, MD  lisinopril (PRINIVIL,ZESTRIL) 5 MG tablet Take 1 tablet (5 mg total) by mouth daily. Patient not  taking: Reported on 09/02/2017 08/02/17   Cristal Ford, DO  metoCLOPramide (REGLAN) 10 MG tablet Take 1 tablet (10 mg total) by mouth every 6 (six) hours as needed for nausea or vomiting. 09/02/17   Isla Pence, MD  Multiple Vitamin (MULTIVITAMIN WITH MINERALS) TABS tablet Take 1 tablet by mouth daily. Patient not taking: Reported on 09/02/2017 07/14/17   Roxan Hockey, MD  ondansetron (ZOFRAN ODT) 4 MG disintegrating tablet Take 1 tablet (4 mg total) by mouth every 8 (eight) hours as needed for nausea or vomiting. Patient not taking: Reported on 09/02/2017 07/09/17   Duffy Bruce, MD    Family History Family History  Problem Relation Age of Onset  . Diabetes Mother   . Hypertension Father     Social History Social History   Tobacco Use  . Smoking status: Never Smoker  . Smokeless tobacco: Never Used  Substance Use Topics  . Alcohol use: No    Alcohol/week: 0.0 oz    Frequency: Never  . Drug use: No     Allergies   Other and Lactose intolerance (gi)   Review of Systems Review of Systems  Gastrointestinal: Positive for abdominal pain, nausea and vomiting.  All other systems reviewed and are negative.    Physical Exam Updated Vital Signs BP (!) 134/103 (BP Location: Right Arm)   Pulse (!) 102   Temp 98.6 F (37 C) (Oral)   Resp 17   Ht 5' 6"  (1.676 m)   Wt 63.5 kg (140 lb)   SpO2 99%   BMI 22.60 kg/m   Physical Exam  Constitutional: She is oriented to person, place, and time. She appears well-developed and well-nourished.  HENT:  Head: Normocephalic and atraumatic.  Mouth/Throat: Mucous membranes are dry.  Eyes: Pupils are equal, round, and reactive to light. EOM are normal.  Cardiovascular: Regular rhythm, normal heart sounds and intact distal pulses. Tachycardia present.  Pulmonary/Chest: Effort normal and breath sounds normal.  Abdominal: Soft. Normal appearance and bowel sounds are normal. There is tenderness in the epigastric area.  Neurological: She  is alert and oriented to person, place, and time.  Skin: Skin is warm. Capillary refill takes less than 2 seconds.  Psychiatric: She has a normal mood and affect. Her behavior is normal.  Nursing note and vitals reviewed.    ED Treatments / Results  Labs (all labs ordered are listed, but only abnormal results are displayed) Labs Reviewed  COMPREHENSIVE METABOLIC PANEL -  Abnormal; Notable for the following components:      Result Value   Chloride 98 (*)    Glucose, Bld 182 (*)    BUN 24 (*)    Total Protein 8.5 (*)    All other components within normal limits  CBC - Abnormal; Notable for the following components:   RDW 11.4 (*)    All other components within normal limits  URINALYSIS, ROUTINE W REFLEX MICROSCOPIC - Abnormal; Notable for the following components:   Glucose, UA 150 (*)    Hgb urine dipstick MODERATE (*)    Ketones, ur 20 (*)    Protein, ur 100 (*)    All other components within normal limits  LIPASE, BLOOD  PREGNANCY, URINE  I-STAT BETA HCG BLOOD, ED ([facility], [facility], [facility] ONLY)    EKG None  Radiology No results found.  Procedures Procedures (including critical care time)  Medications Ordered in ED Medications  sodium chloride 0.9 % bolus 1,000 mL (1,000 mLs Intravenous New Bag/Given 09/02/17 1742)  ondansetron (ZOFRAN) injection 4 mg (4 mg Intravenous Given 09/02/17 1812)  gi cocktail (Maalox,Lidocaine,Donnatal) (30 mLs Oral Given 09/02/17 1812)  sodium chloride 0.9 % bolus 1,000 mL (0 mLs Intravenous Stopped 09/02/17 2004)  famotidine (PEPCID) IVPB 20 mg premix (0 mg Intravenous Stopped 09/02/17 2004)  morphine 4 MG/ML injection 4 mg (4 mg Intravenous Given 09/02/17 2005)  LORazepam (ATIVAN) injection 1 mg (1 mg Intravenous Given 09/02/17 2005)     Initial Impression / Assessment and Plan / ED Course  I have reviewed the triage vital signs and the nursing notes.  Pertinent labs & imaging results that were available during my care of the patient were reviewed by me and  considered in my medical decision making (see chart for details).    Pt is feeling much better.  She has an appt with GI in 1 month.  She is given a refill of her reglan and pepcid.  Return if worse.    Final Clinical Impressions(s) / ED Diagnoses   Final diagnoses:  Diabetic gastroparesis ([place])  Epigastric pain  Hyperglycemia  Dehydration    ED Discharge Orders        Ordered    famotidine (PEPCID) 20 MG tablet  2 times daily     09/02/17 2031    metoCLOPramide (REGLAN) 10 MG tablet  Every 6 hours PRN     09/02/17 2031       Isla Pence, MD 09/02/17 2037

## 2017-09-02 NOTE — ED Triage Notes (Addendum)
Patient here from home with multiple complaints. Here for abdominal pain, lightheadness, unable to eat, nausea, vomiting. Hypotensive in triage.

## 2017-09-02 NOTE — ED Notes (Signed)
Bed: WA03 Expected date:  Expected time:  Means of arrival:  Comments: Triage 2 

## 2017-09-02 NOTE — ED Notes (Signed)
Pt unable to provide a urine sample at this time. Will try again in a little bit. RN aware.

## 2017-09-03 ENCOUNTER — Encounter (HOSPITAL_COMMUNITY): Payer: Self-pay | Admitting: *Deleted

## 2017-09-03 ENCOUNTER — Emergency Department (HOSPITAL_COMMUNITY)
Admission: EM | Admit: 2017-09-03 | Discharge: 2017-09-03 | Disposition: A | Discharge status: Home / Self Care | Payer: 59 | Attending: Emergency Medicine | Admitting: Emergency Medicine

## 2017-09-03 DIAGNOSIS — Z79899 Other long term (current) drug therapy: Secondary | ICD-10-CM | POA: Insufficient documentation

## 2017-09-03 DIAGNOSIS — R1013 Epigastric pain: Secondary | ICD-10-CM | POA: Diagnosis not present

## 2017-09-03 DIAGNOSIS — E109 Type 1 diabetes mellitus without complications: Secondary | ICD-10-CM | POA: Diagnosis not present

## 2017-09-03 DIAGNOSIS — R109 Unspecified abdominal pain: Secondary | ICD-10-CM | POA: Diagnosis not present

## 2017-09-03 DIAGNOSIS — E1043 Type 1 diabetes mellitus with diabetic autonomic (poly)neuropathy: Secondary | ICD-10-CM | POA: Diagnosis not present

## 2017-09-03 DIAGNOSIS — I10 Essential (primary) hypertension: Secondary | ICD-10-CM | POA: Insufficient documentation

## 2017-09-03 DIAGNOSIS — K3184 Gastroparesis: Secondary | ICD-10-CM

## 2017-09-03 DIAGNOSIS — G8929 Other chronic pain: Secondary | ICD-10-CM | POA: Insufficient documentation

## 2017-09-03 DIAGNOSIS — R079 Chest pain, unspecified: Secondary | ICD-10-CM | POA: Diagnosis not present

## 2017-09-03 DIAGNOSIS — E119 Type 2 diabetes mellitus without complications: Secondary | ICD-10-CM

## 2017-09-03 DIAGNOSIS — Z794 Long term (current) use of insulin: Secondary | ICD-10-CM

## 2017-09-03 DIAGNOSIS — IMO0001 Reserved for inherently not codable concepts without codable children: Secondary | ICD-10-CM

## 2017-09-03 LAB — I-STAT CHEM 8, ED
BUN: 16 mg/dL (ref 6–20)
Calcium, Ion: 1.22 mmol/L (ref 1.15–1.40)
Chloride: 102 mmol/L (ref 101–111)
Creatinine, Ser: 0.4 mg/dL — ABNORMAL LOW (ref 0.44–1.00)
Glucose, Bld: 225 mg/dL — ABNORMAL HIGH (ref 65–99)
HCT: 36 % (ref 36.0–46.0)
Hemoglobin: 12.2 g/dL (ref 12.0–15.0)
Potassium: 3.6 mmol/L (ref 3.5–5.1)
Sodium: 138 mmol/L (ref 135–145)
TCO2: 22 mmol/L (ref 22–32)

## 2017-09-03 LAB — CBG MONITORING, ED
Glucose-Capillary: 242 mg/dL — ABNORMAL HIGH (ref 65–99)
Glucose-Capillary: 235 mg/dL — ABNORMAL HIGH (ref 65–99)

## 2017-09-03 MED ORDER — PROMETHAZINE HCL 25 MG RE SUPP: 25.0000 mg | Freq: Four times a day (QID) | RECTAL | 0 refills | Status: DC | PRN

## 2017-09-03 MED ORDER — METOCLOPRAMIDE HCL 5 MG/ML IJ SOLN
10.0000 mg | Freq: Once | INTRAMUSCULAR | Status: AC
  Administered 2017-09-03: 10 mg via INTRAVENOUS
  Filled 2017-09-03: qty 2

## 2017-09-03 MED ORDER — FAMOTIDINE 20 MG PO TABS
20.0000 mg | ORAL_TABLET | Freq: Once | ORAL | Status: AC
  Administered 2017-09-03: 20 mg via ORAL
  Filled 2017-09-03: qty 1

## 2017-09-03 MED ORDER — PROMETHAZINE HCL 25 MG/ML IJ SOLN
12.5000 mg | Freq: Once | INTRAMUSCULAR | Status: AC
  Administered 2017-09-03: 12.5 mg via INTRAVENOUS
  Filled 2017-09-03: qty 1

## 2017-09-03 MED ORDER — MORPHINE SULFATE (PF) 4 MG/ML IV SOLN
4.0000 mg | Freq: Once | INTRAVENOUS | Status: AC
  Administered 2017-09-03: 4 mg via INTRAVENOUS
  Filled 2017-09-03: qty 1

## 2017-09-03 MED ORDER — HYDROCODONE-ACETAMINOPHEN 5-325 MG PO TABS
1.0000 | ORAL_TABLET | Freq: Once | ORAL | Status: AC
  Administered 2017-09-03: 1 via ORAL
  Filled 2017-09-03: qty 1

## 2017-09-03 MED ORDER — INSULIN GLARGINE 100 UNIT/ML ~~LOC~~ SOLN
8.0000 [IU] | Freq: Once | SUBCUTANEOUS | Status: AC
  Administered 2017-09-03: 8 [IU] via SUBCUTANEOUS
  Filled 2017-09-03: qty 0.08

## 2017-09-03 MED ORDER — SODIUM CHLORIDE 0.9 % IV BOLUS
1000.0000 mL | Freq: Once | INTRAVENOUS | Status: AC
  Administered 2017-09-03: 1000 mL via INTRAVENOUS

## 2017-09-03 MED ORDER — GI COCKTAIL ~~LOC~~
30.0000 mL | Freq: Once | ORAL | Status: AC
  Administered 2017-09-03: 30 mL via ORAL
  Filled 2017-09-03: qty 30

## 2017-09-03 NOTE — ED Triage Notes (Signed)
Pt seen here yesterday for same.  Has been d/c'd from [facility], new appointment is not until 7/5 with [facility].  Pt c/o of abd pain, chest pain, n/v.

## 2017-09-03 NOTE — ED Provider Notes (Signed)
[facility]-EMERGENCY DEPT Provider Note   CSN: [id number] Arrival date & time: 09/03/17  0535     History   Chief Complaint Chief Complaint  Patient presents with  . Abdominal Pain  . Chest Pain  . Nausea  . Emesis    HPI [name] is a 27 y.o. female with past medical history of DM 1, gastroparesis, fibromyalgia, GERD, chronic nausea and vomiting, who presents today for evaluation of acute on chronic abdominal, chest and back pain with nausea and vomiting.  She reports that this pain feels like it is her usual pain, it is in the usual location however is slightly worse than usual.  She was seen here yesterday for the same.  She reports that she went home, she felt okay last night but when she woke up the medicines had worn off and she was having symptoms again.  She reports that she did not take her home meds this morning rather came here instead.  She has an appointment on 7/5 with GI.  She reports that she was unable to get her rx filled due to cost.  She has not had any GERD medications today.    She is requesting something for her chronic pains, reports that she is concerned she is unable to take PO meds.    HPI  Past Medical History:  Diagnosis Date  . Anxiety   . Diabetes type 1, uncontrolled (HCC) 11/14/2011   Since age 70  . Fibromyalgia   . Gastroparesis   . Hypertension   . Infection    UTI April 2016    Patient Active Problem List   Diagnosis Date Noted  . DKA (diabetic ketoacidoses) (HCC) 07/27/2017  . Normocytic anemia 07/10/2017  . SIRS (systemic inflammatory response syndrome) (HCC) 06/23/2017  . Hypokalemia 06/23/2017  . Normocytic normochromic anemia 06/23/2017  . GERD (gastroesophageal reflux disease) 06/23/2017  . Intractable nausea and vomiting 04/11/2017  . Nausea and vomiting 04/10/2017  . Essential hypertension   . Colitis   . Abscess and cellulitis of gluteal region 03/24/2017  . Cellulitis and abscess of buttock  03/23/2017  . Malnutrition of moderate degree 03/19/2017  . Anasarca 03/16/2017  . 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 Diarrhea 02/27/2017  . Lower GI bleed 02/27/2017  . DKA (diabetic ketoacidosis) (HCC) 02/26/2017  . Acute metabolic encephalopathy   . DKA, type 1 (HCC) 06/11/2013  . Orthostatic hypotension dysautonomic syndrome (HCC) 04/28/2013  . Body aches 04/28/2013  . Orthostatic hypotension 04/28/2013  . Diabetes mellitus type 1 (HCC) 04/28/2013  . Protein-calorie malnutrition, severe (HCC) 04/28/2013  . Noncompliance with diabetes treatment 09/01/2012  . Dyspnea 02/28/2012  . Sinus tachycardia 02/28/2012  . Hyperglycemia without ketosis 02/28/2012  . Chest pain 12/04/2011  . Insulin dependent diabetes mellitus (HCC) 11/14/2011  . Hypertension associated with diabetes (HCC) 11/14/2011  . Gastroparesis 11/14/2011  . Hyponatremia 09/19/2011  . Chronic abdominal pain 09/18/2011  . Metabolic alkalosis 09/18/2011  . Diabetic hyperosmolar non-ketotic state (HCC) 09/18/2011  . DIAB W/UNSPEC COMP TYPE I [JUV TYPE] UNCNTRL 06/04/2010  . CONSTIPATION 01/01/2010  . RECTAL PAIN 01/01/2010    Past Surgical History:  Procedure Laterality Date  . ANKLE SURGERY    . CHOLECYSTECTOMY  11/15/2011   Procedure: LAPAROSCOPIC CHOLECYSTECTOMY WITH INTRAOPERATIVE CHOLANGIOGRAM;  Surgeon: [name], MD;  Location: [facility];  Service: General;  Laterality: N/A;  . COLONOSCOPY WITH PROPOFOL N/A 06/27/2017   Procedure: COLONOSCOPY WITH PROPOFOL;  Surgeon:  Rachael Fee, MD;  Location: [facility];  Service: Endoscopy;  Laterality: N/A;  . ESOPHAGOGASTRODUODENOSCOPY  12/03/2011   Procedure: ESOPHAGOGASTRODUODENOSCOPY (EGD);  Surgeon: Theda Belfast, MD;  Location: [facility];  Service: Endoscopy;  Laterality: N/A;  . FLEXIBLE SIGMOIDOSCOPY N/A 03/10/2017   Procedure: FLEXIBLE SIGMOIDOSCOPY;  Surgeon: Jeani Hawking, MD;  Location: [facility];   Service: Endoscopy;  Laterality: N/A;  . INCISION AND DRAINAGE PERIRECTAL ABSCESS N/A 03/01/2017   Procedure: IRRIGATION AND DEBRIDEMENT PERIRECTAL ABSCESS;  Surgeon: Ovidio Kin, MD;  Location: [facility];  Service: General;  Laterality: N/A;  . IRRIGATION AND DEBRIDEMENT BUTTOCKS N/A 03/23/2017   Procedure: IRRIGATION AND DEBRIDEMENT BUTTOCKS, SETON PLACEMENT;  Surgeon: Romie Levee, MD;  Location: [facility];  Service: General;  Laterality: N/A;  . LAPAROSCOPY  11/23/2011   Procedure: LAPAROSCOPY DIAGNOSTIC;  Surgeon: Mariella Saa, MD;  Location: [facility];  Service: General;  Laterality: N/A;     OB History    Gravida  1   Para  1   Term      Preterm  1   AB      Living  1     SAB      TAB      Ectopic      Multiple  0   Live Births  1            Home Medications    Prior to Admission medications   Medication Sig Start Date End Date Taking? Authorizing Provider  acetaminophen (TYLENOL) 500 MG tablet Take 1,000 mg by mouth daily as needed for moderate pain.    Yes [provider]  citalopram (CELEXA) 20 MG tablet Take 1 tablet (20 mg total) by mouth daily. 07/13/17  Yes Emokpae, Courage, MD  diclofenac sodium (VOLTAREN) 1 % GEL Apply 2 g topically 4 (four) times daily. 08/01/17  Yes Mikhail, Refton, DO  ferrous sulfate 325 (65 FE) MG tablet Take 1 tablet (325 mg total) by mouth 2 (two) times daily with a meal. 05/13/17  Yes Henson, Vickie L, NP-C  insulin lispro (HUMALOG) 100 UNIT/ML injection Inject 0.02 mLs (2 Units total) into the skin 3 (three) times daily with meals. Patient taking differently: Inject 0-9 Units into the skin 3 (three) times daily with meals. Sliding scale 04/09/17  Yes Romero Belling, MD  LANTUS 100 UNIT/ML injection Inject 0.12 mLs (12 Units total) into the skin daily. 07/13/17  Yes Shon Hale, MD  metoCLOPramide (REGLAN) 10 MG tablet Take 1 tablet (10 mg total) by mouth every 6 (six) hours as needed for nausea or vomiting. 09/02/17   Yes Jacalyn Lefevre, MD  ondansetron (ZOFRAN ODT) 4 MG disintegrating tablet Take 1 tablet (4 mg total) by mouth every 8 (eight) hours as needed for nausea or vomiting. 07/09/17  Yes Shaune Pollack, MD  sucralfate (CARAFATE) 1 GM/10ML suspension Take 10 mLs (1 g total) by mouth 4 (four) times daily -  with meals and at bedtime. 06/17/17  Yes Upstill, Melvenia Beam, PA-C  Alum & Mag Hydroxide-Simeth (GI COCKTAIL) SUSP suspension Take 30 mLs by mouth 3 (three) times daily as needed for indigestion. Shake well. Patient not taking: Reported on 09/02/2017 08/01/17   Edsel Petrin, DO  alum & mag hydroxide-simeth (MAALOX ADVANCED MAX ST) 400-400-40 MG/5ML suspension Take 10 mLs by mouth every 6 (six) hours as needed for indigestion.    [provider]  calcium carbonate (TUMS - DOSED IN MG ELEMENTAL CALCIUM) 500 MG chewable tablet Chew 1 tablet (200 mg  of elemental calcium total) by mouth 3 (three) times daily. Patient not taking: Reported on 09/02/2017 07/13/17   Shon Hale, MD  cholestyramine light (PREVALITE) 4 g packet Take 1 packet (4 g total) by mouth 3 (three) times daily. Patient not taking: Reported on 09/02/2017 03/14/17   Rolly Salter, MD  cyclobenzaprine (FLEXERIL) 10 MG tablet Take 1 tablet (10 mg total) by mouth 2 (two) times daily as needed for muscle spasms. Patient not taking: Reported on 07/25/2017 05/21/17   Dietrich Pates, PA-C  famotidine (PEPCID) 20 MG tablet Take 1 tablet (20 mg total) by mouth 2 (two) times daily. Patient not taking: Reported on 09/03/2017 09/02/17   Jacalyn Lefevre, MD  feeding supplement, GLUCERNA SHAKE, (GLUCERNA SHAKE) LIQD Take 237 mLs by mouth 3 (three) times daily between meals. Patient not taking: Reported on 09/02/2017 08/01/17   Edsel Petrin, DO  gabapentin (NEURONTIN) 300 MG capsule Take 1 capsule (300 mg total) by mouth 2 (two) times daily. Patient not taking: Reported on 09/02/2017 04/03/17   Hetty Blend L, NP-C  glucose 4 GM chewable tablet Chew 1 tablet  (4 g total) by mouth as needed for low blood sugar. Patient not taking: Reported on 09/02/2017 07/13/17   Shon Hale, MD  lactobacillus (FLORANEX/LACTINEX) PACK Take 1 packet (1 g total) by mouth 3 (three) times daily with meals. Patient not taking: Reported on 08/16/2017 07/13/17   Shon Hale, MD  lidocaine (LIDODERM) 5 % Place 1 patch onto the skin daily. Remove & Discard patch within 12 hours or as directed by MD Patient not taking: Reported on 09/02/2017 05/21/17   Dietrich Pates, PA-C  lidocaine (XYLOCAINE) 2 % solution Use as directed 15 mLs in the mouth or throat every 6 (six) hours as needed (stomach pain). Patient not taking: Reported on 09/02/2017 05/31/17   Nira Conn, MD  lipase/protease/amylase (CREON) 36000 UNITS CPEP capsule Take 1 capsule (36,000 Units total) by mouth 3 (three) times daily with meals. Patient not taking: Reported on 09/02/2017 03/14/17   Rolly Salter, MD  lisinopril (PRINIVIL,ZESTRIL) 5 MG tablet Take 1 tablet (5 mg total) by mouth daily. Patient not taking: Reported on 09/02/2017 08/02/17   Edsel Petrin, DO  Multiple Vitamin (MULTIVITAMIN WITH MINERALS) TABS tablet Take 1 tablet by mouth daily. Patient not taking: Reported on 09/02/2017 07/14/17   Shon Hale, MD  promethazine (PHENERGAN) 25 MG suppository Place 1 suppository (25 mg total) rectally every 6 (six) hours as needed for refractory nausea / vomiting. 09/03/17   Cristina Gong, PA-C    Family History Family History  Problem Relation Age of Onset  . Diabetes Mother   . Hypertension Father     Social History Social History   Tobacco Use  . Smoking status: Never Smoker  . Smokeless tobacco: Never Used  Substance Use Topics  . Alcohol use: No    Alcohol/week: 0.0 oz    Frequency: Never  . Drug use: No     Allergies   Other and Lactose intolerance (gi)   Review of Systems Review of Systems  Constitutional: Positive for fatigue. Negative for chills and fever.  HENT:  Negative for ear pain and sore throat.   Eyes: Negative for pain and visual disturbance.  Respiratory: Negative for cough and shortness of breath.   Cardiovascular: Positive for chest pain (Chronic, unchanged). Negative for palpitations.  Gastrointestinal: Positive for abdominal pain, nausea and vomiting. Negative for anal bleeding, blood in stool, constipation, diarrhea and rectal pain.  Endocrine: Negative  for polydipsia and polyuria.  Genitourinary: Negative for dysuria and hematuria.  Musculoskeletal: Positive for back pain (chronic, unchanged). Negative for arthralgias.  Skin: Negative for color change and rash.  Neurological: Negative for seizures and syncope.  All other systems reviewed and are negative.    Physical Exam Updated Vital Signs BP (!) 160/111   Pulse (!) 105   Temp 98 F (36.7 C) (Oral)   Resp 20   Ht 5\' 7"  (1.702 m)   Wt 63.5 kg (140 lb)   SpO2 99%   BMI 21.93 kg/m   Physical Exam  Constitutional: She is oriented to person, place, and time. She appears well-developed and well-nourished.  Non-toxic appearance. No distress.  HENT:  Head: Normocephalic and atraumatic.  Mouth/Throat: Oropharynx is clear and moist.  Eyes: Conjunctivae are normal.  Neck: Neck supple.  Cardiovascular: Normal rate and regular rhythm.  No murmur heard. Pulmonary/Chest: Effort normal and breath sounds normal. No stridor. No respiratory distress. She has no wheezes. She has no rhonchi. She has no rales.  Abdominal: Soft. Normal appearance and bowel sounds are normal. There is tenderness in the epigastric area.  Musculoskeletal: She exhibits no edema.  Neurological: She is alert and oriented to person, place, and time.  Skin: Skin is warm and dry.  Psychiatric: She has a normal mood and affect.  Nursing note and vitals reviewed.    ED Treatments / Results  Labs (all labs ordered are listed, but only abnormal results are displayed) Labs Reviewed  I-STAT CHEM 8, ED - Abnormal;  Notable for the following components:      Result Value   Creatinine, Ser 0.40 (*)    Glucose, Bld 225 (*)    All other components within normal limits  CBG MONITORING, ED - Abnormal; Notable for the following components:   Glucose-Capillary 242 (*)    All other components within normal limits  CBG MONITORING, ED - Abnormal; Notable for the following components:   Glucose-Capillary 235 (*)    All other components within normal limits    EKG None  Radiology No results found.  Procedures Procedures (including critical care time)  Medications Ordered in ED Medications  sodium chloride 0.9 % bolus 1,000 mL (0 mLs Intravenous Stopped 09/03/17 0755)  metoCLOPramide (REGLAN) injection 10 mg (10 mg Intravenous Given 09/03/17 0651)  gi cocktail (Maalox,Lidocaine,Donnatal) (30 mLs Oral Given 09/03/17 0655)  promethazine (PHENERGAN) injection 12.5 mg (12.5 mg Intravenous Given 09/03/17 0821)  HYDROcodone-acetaminophen (NORCO/VICODIN) 5-325 MG per tablet 1 tablet (1 tablet Oral Given 09/03/17 1009)  famotidine (PEPCID) tablet 20 mg (20 mg Oral Given 09/03/17 1009)  insulin glargine (LANTUS) injection 8 Units (8 Units Subcutaneous Given 09/03/17 1214)  morphine 4 MG/ML injection 4 mg (4 mg Intravenous Given 09/03/17 1321)  promethazine (PHENERGAN) injection 12.5 mg (12.5 mg Intravenous Given 09/03/17 1321)     Initial Impression / Assessment and Plan / ED Course  I have reviewed the triage vital signs and the nursing notes.  Pertinent labs & imaging results that were available during my care of the patient were reviewed by me and considered in my medical decision making (see chart for details).  Clinical Course as of Sep 03 1409  Thu Sep 03, 2017  0709 Anion gap 14- normal   [EH]  2956 Patient re-evaluated, still nauseous, reports feels unable to take PO meds.  Will give phenergan and re-assess.    [EH]  1041 Patient re-evaluated, she reports her pain is still an 8, wants it to  be a 5.  Given vicodin  for her PO challenge.     [EH]  1115 Spoke with Diabetes coordinator, they recommend 8 units lantus basal insulin to keep patient from DKA.  Will get CBG, if elevated then will give lantus.    [EH]  1125 Patient re-evaluated, she reports feeling unchanged and that she wants the pain to go away.  Discussed cannabinoid hyperemesis syndrome, patient reports that in the past haldol has not helped.  Discussed Ct, additional labs which I feel are limited use given multiple recent work ups and patient agreed.  Will attempt PO challenge.     [EH]  1250 Patient re-evaluated, she still declines CT scan and additional labs.  Wants pain treated so she can go home.  Will give dose of morphine, additional phenergan to bring up to 12.5.  She continues to report that this is consistent with her usual pain and nausea.  This is not the worst her symptoms have been.    [EH]  1348 Patient re-evaluated after morphine.  She reports feeling better and wanting to go home.  She still does not wish for any additional testing.  Return precautions and the importance of basic diabetes care and monitoring discussed.  Patient stated her understanding.  Discharge home.    [EH]    Clinical Course User Index [EH] Cristina Gong, PA-C   Patient presents today for evaluation of acute on chronic abdominal, and chest pain with associated nausea and vomiting.  She has a long-standing history of ED evaluations for this with 30 ED visits in the past 6 months.  Yesterday she was treated with pain meds, nausea meds with improvement.  Plan to treat with 1 L IV fluids, IV Reglan, and when she is able to tolerate p.o.'s a GI cocktail.    Patient was reevaluated multiple times.  It took a total of 2 doses of Phenergan, GI cocktail, 10 mg Reglan, 20 mg Pepcid, 5 mg Norco, and 4 mg morphine to control her pain.  Patient was given bolus Lantus 8 units per suggestion of her diabetes educator.  After this patient stated that she wished to go  home.  Multiple times during her stay we discussed additional testing including labs, CAT scan, however she continued reports that this is her usual symptoms from her gastroparesis, and she repeatedly declined additional work-up and testing.  Patient was able to pass a p.o. challenge, after which she felt better.  I-STAT Chem-8 was obtained, no anion gap present, mild hyperglycemia, which was treated with fluids.  When patient was seen yesterday she had much more extensive work-up including a CMP, CBC, and UA along with lipase.  Her pregnancy test yesterday was negative, therefore do not feel the need to reorder today.  Patient declined UA.  After morphine patient requested discharge.  She was instructed to call her new GI doctor's office to get on the cancellation list and see if she could get a sooner appointment.  Patient stated her understanding, and that she still wished for discharge.  Patient discharged home after return precautions were discussed.  Patient was consistently tachycardic while in the department.  Chart review shows that during her last few visits she has consistently been tachycardic.  This appears to be patient's baseline, she denied any chest pain or shortness of breath.  Final Clinical Impressions(s) / ED Diagnoses   Final diagnoses:  Chronic abdominal pain  Insulin dependent diabetes mellitus (HCC)  Gastroparesis    ED Discharge Orders  Ordered    promethazine (PHENERGAN) 25 MG suppository  Every 6 hours PRN     09/03/17 1353       Cristina Gong, [place] 09/03/17 1411    Nira Conn, MD 09/04/17 [phone]

## 2017-09-03 NOTE — Progress Notes (Signed)
Inpatient Diabetes Program Recommendations  AACE/ADA: New Consensus Statement on Inpatient Glycemic Control (2015)  Target Ranges:  Prepandial:   less than 140 mg/dL      Peak postprandial:   less than 180 mg/dL (1-2 hours)      Critically ill patients:  140 - 180 mg/dL   Lab Results  Component Value Date   GLUCAP 242 (H) 09/03/2017   HGBA1C 5.7 (H) 04/10/2017    Review of Glycemic Control  Diabetes history: DM1 Outpatient Diabetes medications: Lantus 12 units QD, Humalog 0-9 units tidwc Current orders for Inpatient glycemic control: None.  Needs basal insulin in order to keep pt out of DKA.  Inpatient Diabetes Program Recommendations:     Lantus 8 units Q24H (give now) Novolog 0-9 units Q4H  Will call RN regarding above.   Thank you. Ailene Ards, RD, LDN, CDE Inpatient Diabetes Coordinator [phone]

## 2017-09-03 NOTE — Discharge Instructions (Addendum)
I have given you some general information about caring for your self with Dm1 while sick.  Please follow these general guidelines unless you have been told otherwise by your endocrinologist.  Please make sure you are staying well hydrated.  Please eat small frequent snacks.    Today you received medications that may make you sleepy or impair your ability to make decisions.  For the next 24 hours please do not drive, operate heavy machinery, care for a small child with out another adult present, or perform any activities that may cause harm to you or someone else if you were to fall asleep or be impaired.   You are being prescribed a medication which may make you sleepy. Please follow up of listed precautions for at least 24 hours after taking one dose.   Please call your GI doctor for a sooner appointment.

## 2017-09-05 ENCOUNTER — Other Ambulatory Visit: Payer: Self-pay

## 2017-09-05 ENCOUNTER — Emergency Department (HOSPITAL_COMMUNITY): Payer: 59

## 2017-09-05 ENCOUNTER — Emergency Department (HOSPITAL_COMMUNITY)
Admission: EM | Admit: 2017-09-05 | Discharge: 2017-09-05 | Disposition: A | Discharge status: Home / Self Care | Payer: 59 | Attending: Emergency Medicine | Admitting: Emergency Medicine

## 2017-09-05 ENCOUNTER — Encounter (HOSPITAL_COMMUNITY): Payer: Self-pay | Admitting: Emergency Medicine

## 2017-09-05 DIAGNOSIS — R112 Nausea with vomiting, unspecified: Secondary | ICD-10-CM

## 2017-09-05 DIAGNOSIS — Z79899 Other long term (current) drug therapy: Secondary | ICD-10-CM | POA: Diagnosis not present

## 2017-09-05 DIAGNOSIS — R079 Chest pain, unspecified: Secondary | ICD-10-CM | POA: Diagnosis not present

## 2017-09-05 DIAGNOSIS — E860 Dehydration: Secondary | ICD-10-CM

## 2017-09-05 DIAGNOSIS — G8929 Other chronic pain: Secondary | ICD-10-CM | POA: Diagnosis not present

## 2017-09-05 DIAGNOSIS — R197 Diarrhea, unspecified: Secondary | ICD-10-CM | POA: Insufficient documentation

## 2017-09-05 DIAGNOSIS — R1084 Generalized abdominal pain: Secondary | ICD-10-CM | POA: Diagnosis present

## 2017-09-05 DIAGNOSIS — E109 Type 1 diabetes mellitus without complications: Secondary | ICD-10-CM | POA: Diagnosis not present

## 2017-09-05 DIAGNOSIS — I10 Essential (primary) hypertension: Secondary | ICD-10-CM | POA: Insufficient documentation

## 2017-09-05 DIAGNOSIS — R0602 Shortness of breath: Secondary | ICD-10-CM | POA: Diagnosis not present

## 2017-09-05 DIAGNOSIS — R109 Unspecified abdominal pain: Secondary | ICD-10-CM

## 2017-09-05 DIAGNOSIS — Z794 Long term (current) use of insulin: Secondary | ICD-10-CM | POA: Insufficient documentation

## 2017-09-05 LAB — BASIC METABOLIC PANEL
Anion gap: 12 (ref 5–15)
BUN: 16 mg/dL (ref 6–20)
CO2: 24 mmol/L (ref 22–32)
Calcium: 9.2 mg/dL (ref 8.9–10.3)
Chloride: 101 mmol/L (ref 101–111)
Creatinine, Ser: 0.48 mg/dL (ref 0.44–1.00)
GFR calc Af Amer: >60 mL/min (ref >60)
GFR calc non Af Amer: >60 mL/min (ref >60)
Glucose, Bld: 231 mg/dL — ABNORMAL HIGH (ref 65–99)
Potassium: 3.2 mmol/L — ABNORMAL LOW (ref 3.5–5.1)
Sodium: 137 mmol/L (ref 135–145)

## 2017-09-05 LAB — HEPATIC FUNCTION PANEL
ALT: 20 U/L (ref 14–54)
AST: 18 U/L (ref 15–41)
Albumin: 4 g/dL (ref 3.5–5.0)
Alkaline Phosphatase: 70 U/L (ref 38–126)
Bilirubin, Direct: 0.1 mg/dL (ref 0.1–0.5)
Indirect Bilirubin: 0.9 mg/dL (ref 0.3–0.9)
Total Bilirubin: 1 mg/dL (ref 0.3–1.2)
Total Protein: 7.7 g/dL (ref 6.5–8.1)

## 2017-09-05 LAB — URINALYSIS, ROUTINE W REFLEX MICROSCOPIC
Bilirubin Urine: NEGATIVE
Glucose, UA: >=500 mg/dL — AB
Ketones, ur: 20 mg/dL — AB
Leukocytes, UA: NEGATIVE
Nitrite: NEGATIVE
Protein, ur: 30 mg/dL — AB
Specific Gravity, Urine: 1.009 (ref 1.005–1.030)
pH: 7 (ref 5.0–8.0)

## 2017-09-05 LAB — I-STAT BETA HCG BLOOD, ED (MC, WL, AP ONLY): I-stat hCG, quantitative: <5 m[IU]/mL (ref <5)

## 2017-09-05 LAB — CBC
HCT: 32.9 % — ABNORMAL LOW (ref 36.0–46.0)
Hemoglobin: 11.7 g/dL — ABNORMAL LOW (ref 12.0–15.0)
MCH: 29.5 pg (ref 26.0–34.0)
MCHC: 35.6 g/dL (ref 30.0–36.0)
MCV: 83.1 fL (ref 78.0–100.0)
Platelets: 267 10*3/uL (ref 150–400)
RBC: 3.96 MIL/uL (ref 3.87–5.11)
RDW: 11.3 % — ABNORMAL LOW (ref 11.5–15.5)
WBC: 4 10*3/uL (ref 4.0–10.5)

## 2017-09-05 LAB — I-STAT TROPONIN, ED: Troponin i, poc: 0 ng/mL (ref 0.00–0.08)

## 2017-09-05 LAB — LIPASE, BLOOD: Lipase: 24 U/L (ref 11–51)

## 2017-09-05 IMAGING — CR DG CHEST 2V
3 series · 3 of 3 positions shown · non-contrast
Comparison: Chest radiograph performed [DATE]

CLINICAL DATA: Acute onset of shortness of breath, nausea and
vomiting.

EXAM:
CHEST - 2 VIEW

[w chest lat (1 of 2)]
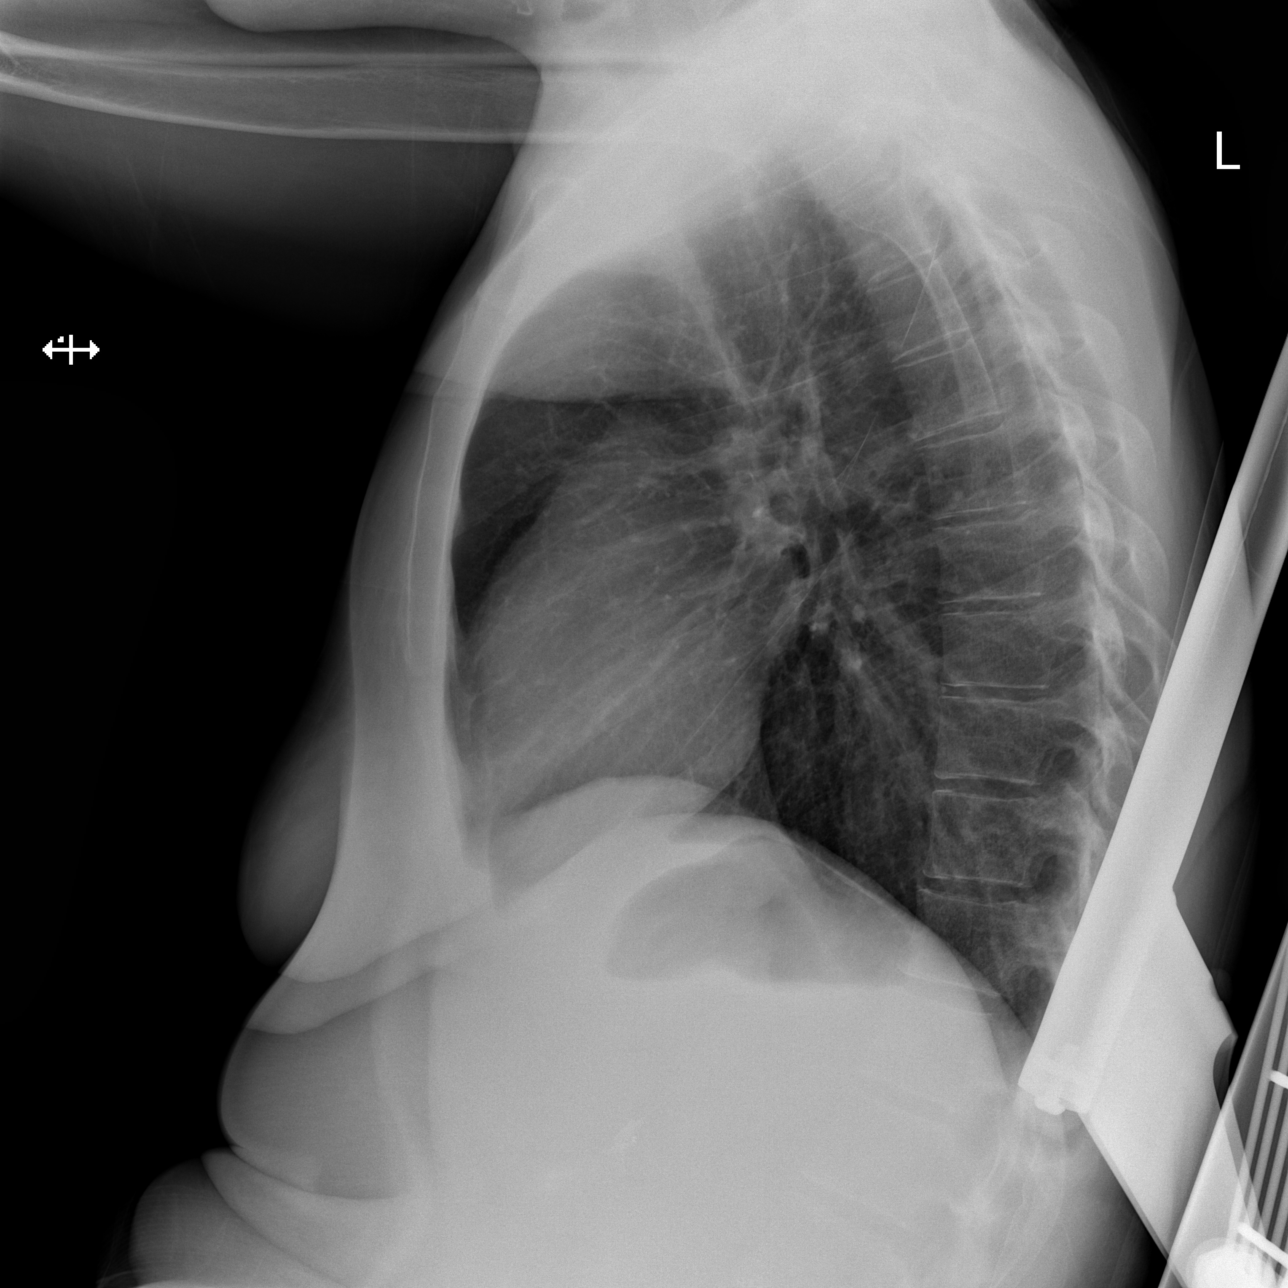

[w chest lat (2 of 2)]
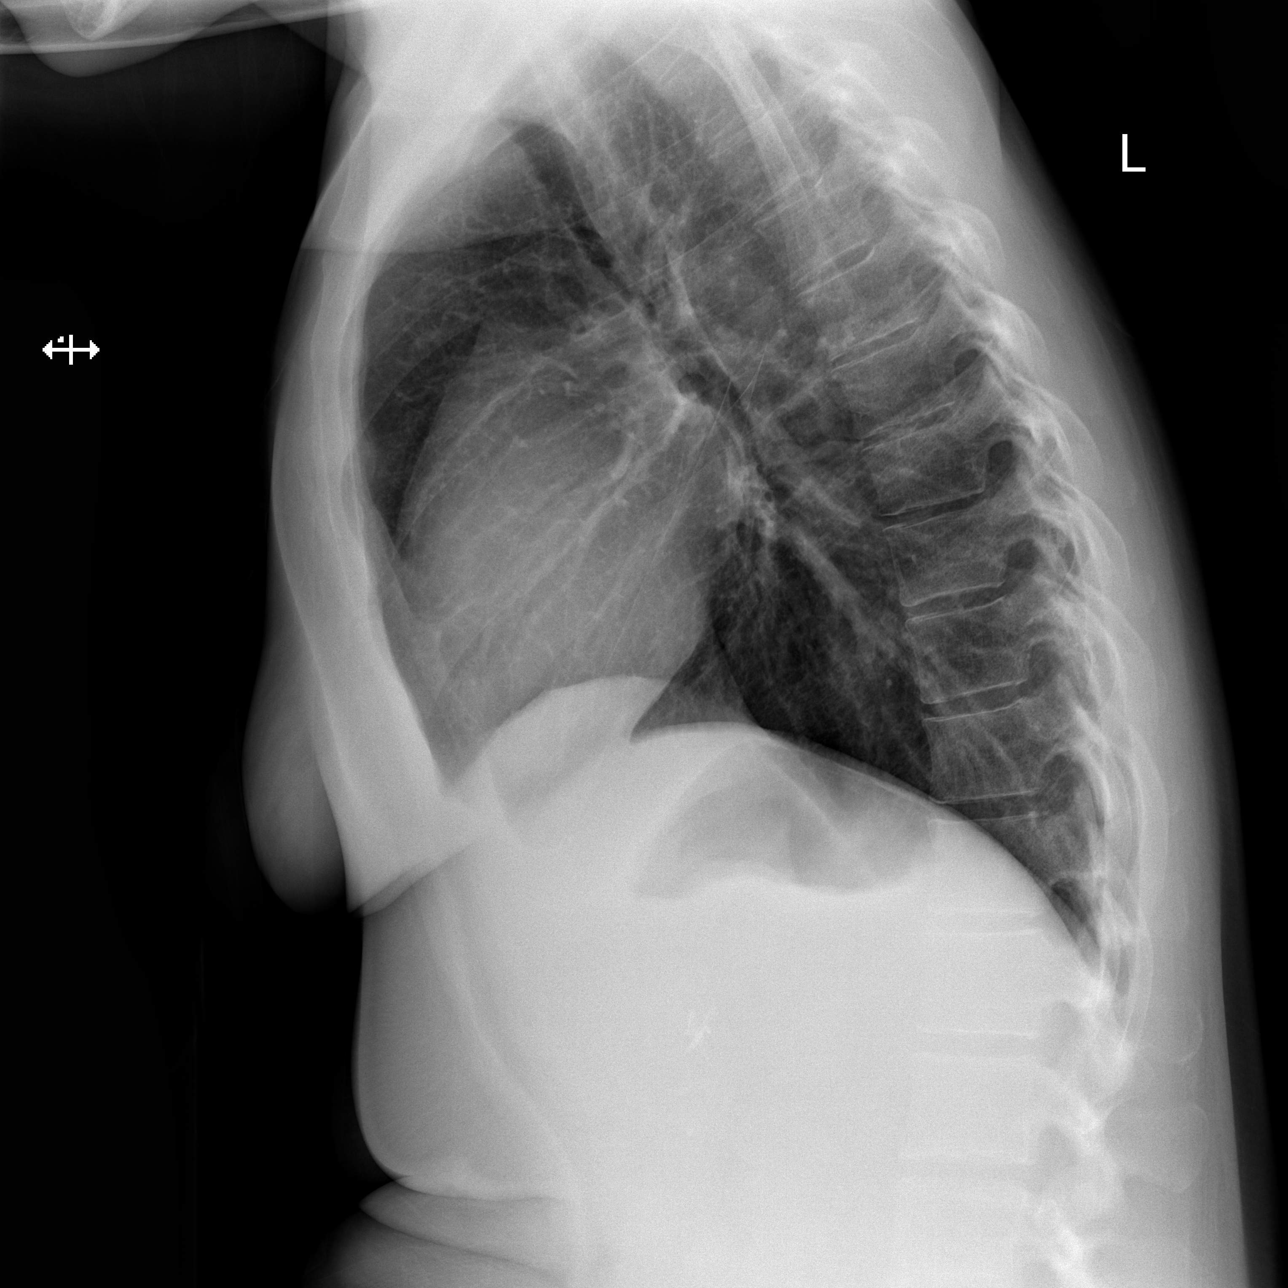

[x chest ap]
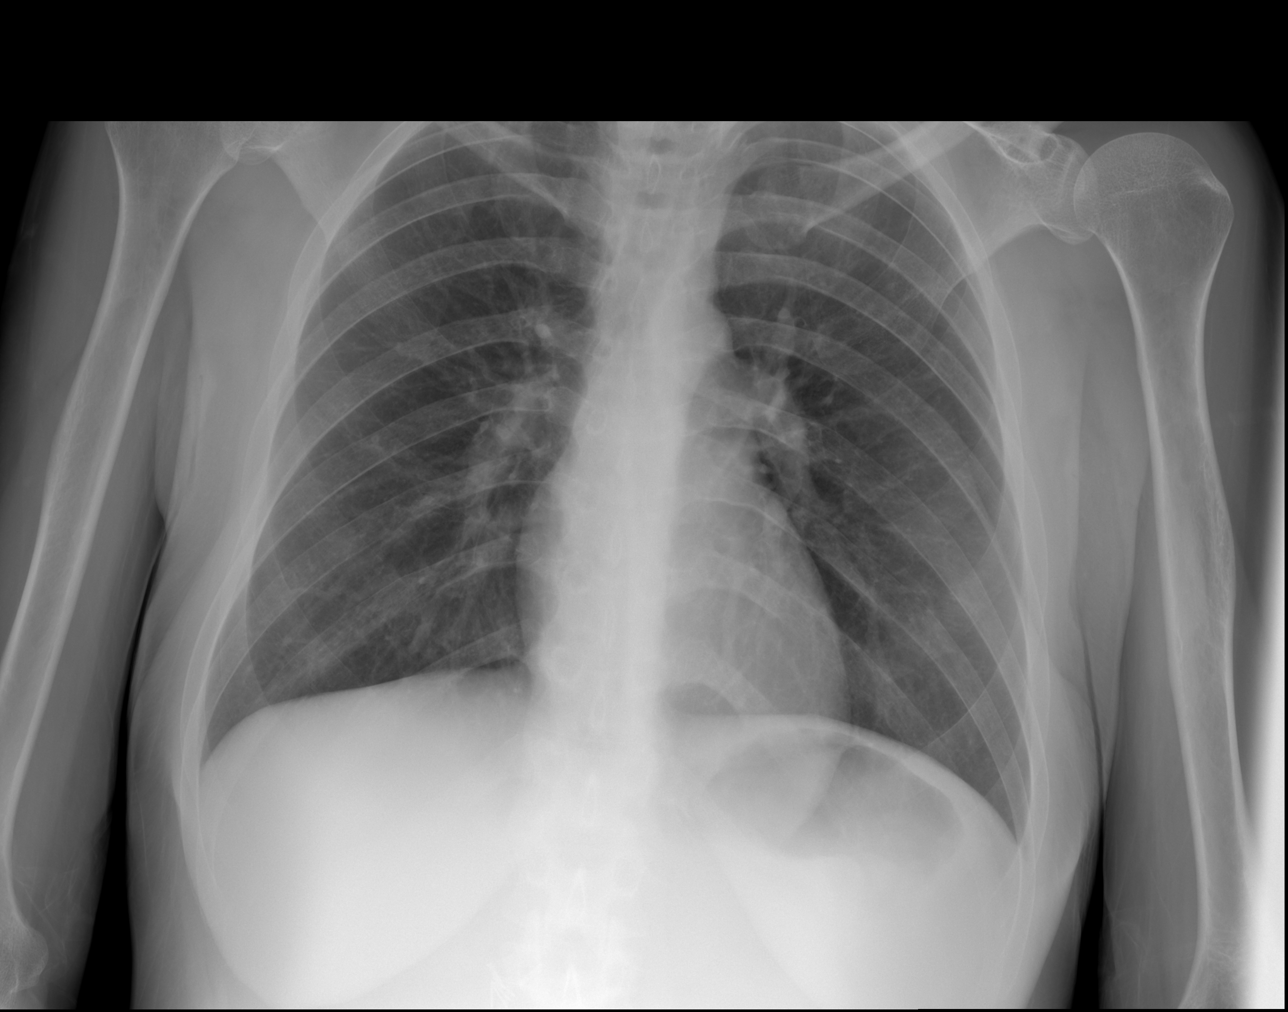

[3 of 3 positions shown; findings below may reference images not displayed]

FINDINGS: The lungs are well-aerated and clear. There is no evidence of focal
opacification, pleural effusion or pneumothorax.

The heart is normal in size; the mediastinal contour is within
normal limits. No acute osseous abnormalities are seen.
IMPRESSION: No acute cardiopulmonary process seen.

## 2017-09-05 MED ORDER — HALOPERIDOL LACTATE 5 MG/ML IJ SOLN
2.5000 mg | Freq: Once | INTRAMUSCULAR | Status: AC
  Administered 2017-09-05: 2.5 mg via INTRAVENOUS
  Filled 2017-09-05: qty 1

## 2017-09-05 MED ORDER — SODIUM CHLORIDE 0.9 % IV BOLUS
1000.0000 mL | Freq: Once | INTRAVENOUS | Status: AC
  Administered 2017-09-05: 1000 mL via INTRAVENOUS

## 2017-09-05 MED ORDER — KETOROLAC TROMETHAMINE 30 MG/ML IJ SOLN
30.0000 mg | Freq: Once | INTRAMUSCULAR | Status: AC
  Administered 2017-09-05: 30 mg via INTRAVENOUS
  Filled 2017-09-05: qty 1

## 2017-09-05 MED ORDER — ONDANSETRON 4 MG PO TBDP: 4.0000 mg | ORAL_TABLET | Freq: Three times a day (TID) | ORAL | 0 refills | Status: DC | PRN

## 2017-09-05 MED ORDER — METOCLOPRAMIDE HCL 5 MG/ML IJ SOLN
10.0000 mg | Freq: Once | INTRAMUSCULAR | Status: AC
  Administered 2017-09-05: 10 mg via INTRAVENOUS
  Filled 2017-09-05: qty 2

## 2017-09-05 MED ORDER — SUCRALFATE 1 GM/10ML PO SUSP: 1.0000 g | Freq: Three times a day (TID) | ORAL | 0 refills | Status: DC

## 2017-09-05 MED ORDER — LORAZEPAM 2 MG/ML IJ SOLN
1.0000 mg | Freq: Once | INTRAMUSCULAR | Status: AC
  Administered 2017-09-05: 1 mg via INTRAVENOUS
  Filled 2017-09-05: qty 1

## 2017-09-05 MED ORDER — PROMETHAZINE HCL 25 MG RE SUPP: 25.0000 mg | Freq: Four times a day (QID) | RECTAL | 0 refills | Status: DC | PRN

## 2017-09-05 MED ORDER — FENTANYL CITRATE (PF) 100 MCG/2ML IJ SOLN
50.0000 ug | Freq: Once | INTRAMUSCULAR | Status: AC
  Administered 2017-09-05: 50 ug via INTRAVENOUS
  Filled 2017-09-05: qty 2

## 2017-09-05 NOTE — ED Notes (Signed)
Requesting urine from patient.

## 2017-09-05 NOTE — ED Notes (Signed)
Patient is in the bathroom

## 2017-09-05 NOTE — ED Provider Notes (Signed)
[facility] DEPT Provider Note   CSN: [id number] Arrival date & time: 09/05/17  0329     History   Chief Complaint Chief Complaint  Patient presents with  . Chest Pain  . Abdominal Pain  . Emesis  . Diarrhea    HPI [name] is a 27 y.o. female.  HPI Patient presents with acute on chronic abdominal pain.  History of diabetes fibromyalgia gastroparesis and frequent visits for nausea vomiting diarrhea and abdominal pain.  This is her third visit in 4 days for similar symptoms.  States the pain is severe.  States she does need something for the pain.  It is in her upper abdomen.  States she has had vomiting.  States it also goes up to the chest.  Also her family member is having diarrhea with losing control of her bowels. Past Medical History:  Diagnosis Date  . Anxiety   . Diabetes type 1, uncontrolled ([place]) 11/14/2011   Since age 29  . Fibromyalgia   . Gastroparesis   . Hypertension   . Infection    UTI April 2016    Patient Active Problem List   Diagnosis Date Noted  . DKA (diabetic ketoacidoses) ([place]) 07/27/2017  . Normocytic anemia 07/10/2017  . SIRS (systemic inflammatory response syndrome) ([place]) 06/23/2017  . Hypokalemia 06/23/2017  . Normocytic normochromic anemia 06/23/2017  . GERD (gastroesophageal reflux disease) 06/23/2017  . Intractable nausea and vomiting 04/11/2017  . Nausea and vomiting 04/10/2017  . Essential hypertension   . Colitis   . Abscess and cellulitis of gluteal region 03/24/2017  . Cellulitis and abscess of buttock 03/23/2017  . Malnutrition of moderate degree 03/19/2017  . Anasarca 03/16/2017  . 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 Diarrhea 02/27/2017  . Lower GI bleed 02/27/2017  . DKA (diabetic ketoacidosis) ([place]) 02/26/2017  . Acute metabolic encephalopathy   . DKA, type 1 ([place]) 06/11/2013  . Orthostatic hypotension dysautonomic syndrome ([place]) 04/28/2013  .  Body aches 04/28/2013  . Orthostatic hypotension 04/28/2013  . Diabetes mellitus type 1 ([place]) 04/28/2013  . Protein-calorie malnutrition, severe ([place]) 04/28/2013  . Noncompliance with diabetes treatment 09/01/2012  . Dyspnea 02/28/2012  . Sinus tachycardia 02/28/2012  . Hyperglycemia without ketosis 02/28/2012  . Chest pain 12/04/2011  . Insulin dependent diabetes mellitus ([place]) 11/14/2011  . Hypertension associated with diabetes ([place]) 11/14/2011  . Gastroparesis 11/14/2011  . Hyponatremia 09/19/2011  . Chronic abdominal pain 09/18/2011  . Metabolic alkalosis 51/83/4373  . Diabetic hyperosmolar non-ketotic state ([place]) 09/18/2011  . DIAB W/UNSPEC COMP TYPE I [JUV TYPE] UNCNTRL 06/04/2010  . CONSTIPATION 01/01/2010  . RECTAL PAIN 01/01/2010    Past Surgical History:  Procedure Laterality Date  . ANKLE SURGERY    . CHOLECYSTECTOMY  11/15/2011   Procedure: LAPAROSCOPIC CHOLECYSTECTOMY WITH INTRAOPERATIVE CHOLANGIOGRAM;  Surgeon: [name], MD;  Location: [facility];  Service: General;  Laterality: N/A;  . COLONOSCOPY WITH PROPOFOL N/A 06/27/2017   Procedure: COLONOSCOPY WITH PROPOFOL;  Surgeon: Milus Banister, MD;  Location: [facility] END[facility];  Service: Endoscopy;  Laterality: N/A;  . ESOPHAGOGASTRODUODENOSCOPY  12/03/2011   Procedure: ESOPHAGOGASTRODUODENOSCOPY (EGD);  Surgeon: Beryle Beams, MD;  Location: [facility];  Service: Endoscopy;  Laterality: N/A;  . FLEXIBLE SIGMOIDOSCOPY N/A 03/10/2017   Procedure: FLEXIBLE SIGMOIDOSCOPY;  Surgeon: Carol Ada, MD;  Location: [facility];  Service: Endoscopy;  Laterality: N/A;  . INCISION AND DRAINAGE PERIRECTAL ABSCESS N/A 03/01/2017   Procedure: IRRIGATION AND DEBRIDEMENT PERIRECTAL ABSCESS;  Surgeon: Alphonsa Overall, MD;  Location: [facility];  Service: General;  Laterality: N/A;  . IRRIGATION AND DEBRIDEMENT BUTTOCKS N/A 03/23/2017   Procedure: IRRIGATION AND DEBRIDEMENT BUTTOCKS, SETON PLACEMENT;  Surgeon: [name], MD;  Location: [facility];  Service: General;  Laterality: N/A;  . LAPAROSCOPY  11/23/2011   Procedure: LAPAROSCOPY DIAGNOSTIC;  Surgeon: Edward Jolly, MD;  Location: [facility];  Service: General;  Laterality: N/A;     OB History    Gravida  1   Para  1   Term      Preterm  1   AB      Living  1     SAB      TAB      Ectopic      Multiple  0   Live Births  1            Home Medications    Prior to Admission medications   Medication Sig Start Date End Date Taking? Authorizing Provider  acetaminophen (TYLENOL) 500 MG tablet Take 1,000 mg by mouth daily as needed for moderate pain.     [provider]  Alum & Mag Hydroxide-Simeth (GI COCKTAIL) SUSP suspension Take 30 mLs by mouth 3 (three) times daily as needed for indigestion. Shake well. Patient not taking: Reported on 09/02/2017 08/01/17   Cristal Ford, DO  alum & mag hydroxide-simeth (MAALOX ADVANCED MAX ST) 400-400-40 MG/5ML suspension Take 10 mLs by mouth every 6 (six) hours as needed for indigestion.    [provider]  calcium carbonate (TUMS - DOSED IN MG ELEMENTAL CALCIUM) 500 MG chewable tablet Chew 1 tablet (200 mg of elemental calcium total) by mouth 3 (three) times daily. Patient not taking: Reported on 09/02/2017 07/13/17   Roxan Hockey, MD  cholestyramine light (PREVALITE) 4 g packet Take 1 packet (4 g total) by mouth 3 (three) times daily. Patient not taking: Reported on 09/02/2017 03/14/17   Lavina Hamman, MD  citalopram (CELEXA) 20 MG tablet Take 1 tablet (20 mg total) by mouth daily. 07/13/17   Roxan Hockey, MD  cyclobenzaprine (FLEXERIL) 10 MG tablet Take 1 tablet (10 mg total) by mouth 2 (two) times daily as needed for muscle spasms. Patient not taking: Reported on 07/25/2017 05/21/17   Delia Heady, PA-C  diclofenac sodium (VOLTAREN) 1 % GEL Apply 2 g topically 4 (four) times daily. 08/01/17   Mikhail, Velta Addison, DO  famotidine (PEPCID) 20 MG tablet Take 1 tablet (20 mg total) by mouth 2 (two) times  daily. Patient not taking: Reported on 09/03/2017 09/02/17   Isla Pence, MD  feeding supplement, GLUCERNA SHAKE, (GLUCERNA SHAKE) LIQD Take 237 mLs by mouth 3 (three) times daily between meals. Patient not taking: Reported on 09/02/2017 08/01/17   Cristal Ford, DO  ferrous sulfate 325 (65 FE) MG tablet Take 1 tablet (325 mg total) by mouth 2 (two) times daily with a meal. 05/13/17   Henson, Vickie L, NP-C  gabapentin (NEURONTIN) 300 MG capsule Take 1 capsule (300 mg total) by mouth 2 (two) times daily. Patient not taking: Reported on 09/02/2017 04/03/17   Harland Dingwall L, NP-C  glucose 4 GM chewable tablet Chew 1 tablet (4 g total) by mouth as needed for low blood sugar. Patient not taking: Reported on 09/02/2017 07/13/17   Roxan Hockey, MD  insulin lispro (HUMALOG) 100 UNIT/ML injection Inject 0.02 mLs (2 Units total) into the skin 3 (three) times daily with meals. Patient taking differently: Inject 0-9 Units into the  skin 3 (three) times daily with meals. Sliding scale 04/09/17   Renato Shin, MD  lactobacillus (FLORANEX/LACTINEX) PACK Take 1 packet (1 g total) by mouth 3 (three) times daily with meals. Patient not taking: Reported on 08/16/2017 07/13/17   Roxan Hockey, MD  LANTUS 100 UNIT/ML injection Inject 0.12 mLs (12 Units total) into the skin daily. 07/13/17   Roxan Hockey, MD  lidocaine (LIDODERM) 5 % Place 1 patch onto the skin daily. Remove & Discard patch within 12 hours or as directed by MD Patient not taking: Reported on 09/02/2017 05/21/17   Delia Heady, PA-C  lidocaine (XYLOCAINE) 2 % solution Use as directed 15 mLs in the mouth or throat every 6 (six) hours as needed (stomach pain). Patient not taking: Reported on 09/02/2017 05/31/17   Fatima Blank, MD  lipase/protease/amylase (CREON) 36000 UNITS CPEP capsule Take 1 capsule (36,000 Units total) by mouth 3 (three) times daily with meals. Patient not taking: Reported on 09/02/2017 03/14/17   Lavina Hamman, MD  lisinopril  (PRINIVIL,ZESTRIL) 5 MG tablet Take 1 tablet (5 mg total) by mouth daily. Patient not taking: Reported on 09/02/2017 08/02/17   Cristal Ford, DO  metoCLOPramide (REGLAN) 10 MG tablet Take 1 tablet (10 mg total) by mouth every 6 (six) hours as needed for nausea or vomiting. 09/02/17   Isla Pence, MD  Multiple Vitamin (MULTIVITAMIN WITH MINERALS) TABS tablet Take 1 tablet by mouth daily. Patient not taking: Reported on 09/02/2017 07/14/17   Roxan Hockey, MD  ondansetron (ZOFRAN ODT) 4 MG disintegrating tablet Take 1 tablet (4 mg total) by mouth every 8 (eight) hours as needed for nausea or vomiting. 07/09/17   Duffy Bruce, MD  promethazine (PHENERGAN) 25 MG suppository Place 1 suppository (25 mg total) rectally every 6 (six) hours as needed for refractory nausea / vomiting. 09/03/17   Lorin Glass, PA-C  sucralfate (CARAFATE) 1 GM/10ML suspension Take 10 mLs (1 g total) by mouth 4 (four) times daily -  with meals and at bedtime. 06/17/17   Charlann Lange, PA-C    Family History Family History  Problem Relation Age of Onset  . Diabetes Mother   . Hypertension Father     Social History Social History   Tobacco Use  . Smoking status: Never Smoker  . Smokeless tobacco: Never Used  Substance Use Topics  . Alcohol use: No    Alcohol/week: 0.0 oz    Frequency: Never  . Drug use: No     Allergies   Other and Lactose intolerance (gi)   Review of Systems Review of Systems  Constitutional: Positive for appetite change.  HENT: Negative for congestion.   Respiratory: Negative for shortness of breath.   Cardiovascular: Negative for chest pain.  Gastrointestinal: Positive for abdominal pain, diarrhea, nausea and vomiting.  Genitourinary: Negative for flank pain.  Musculoskeletal: Negative for back pain.  Skin: Negative for rash.  Neurological: Negative for weakness.  Psychiatric/Behavioral: Negative for confusion.     Physical Exam Updated Vital Signs BP (!) 189/119    Pulse (!) 116   Temp (!) 97.4 F (36.3 C) (Oral)   Resp 18   Ht 5' 7"  (1.702 m)   Wt 63.5 kg (140 lb)   SpO2 94%   BMI 21.93 kg/m   Physical Exam  Constitutional: She appears well-developed.  HENT:  Head: Normocephalic.  Neck: Normal range of motion.  Cardiovascular: Regular rhythm.  Pulmonary/Chest: Effort normal.  Abdominal: She exhibits no distension and no mass.  Laying in bed.  Somewhat uncooperative abdominal exam.  No hernia palpated.  No mass palpated.  Musculoskeletal:       Right lower leg: She exhibits no edema.       Left lower leg: She exhibits no edema.  Neurological: She is alert.  Skin: Skin is warm. Capillary refill takes less than 2 seconds.     ED Treatments / Results  Labs (all labs ordered are listed, but only abnormal results are displayed) Labs Reviewed  BASIC METABOLIC PANEL - Abnormal; Notable for the following components:      Result Value   Potassium 3.2 (*)    Glucose, Bld 231 (*)    All other components within normal limits  CBC - Abnormal; Notable for the following components:   Hemoglobin 11.7 (*)    HCT 32.9 (*)    RDW 11.3 (*)    All other components within normal limits  LIPASE, BLOOD  HEPATIC FUNCTION PANEL  URINALYSIS, ROUTINE W REFLEX MICROSCOPIC  I-STAT TROPONIN, ED  I-STAT BETA HCG BLOOD, ED ([facility], [facility], [facility] ONLY)    EKG EKG Interpretation  Date/Time:  Saturday September 05 2017 04:04:11 EDT Ventricular Rate:  113 PR Interval:    QRS Duration: 81 QT Interval:  343 QTC Calculation: 471 R Axis:   75 Text Interpretation:  Sinus tachycardia Baseline wander in lead(s) I II III aVL aVF V2 V6 Confirmed by Davonna Belling [id number]) on 09/05/2017 6:10:08 AM   Radiology Dg Chest 2 View  Result Date: 09/05/2017 CLINICAL DATA:  Acute onset of shortness of breath, nausea and vomiting. EXAM: CHEST - 2 VIEW COMPARISON:  Chest radiograph performed 07/23/2017 FINDINGS: The lungs are well-aerated and clear. There is no evidence of focal  opacification, pleural effusion or pneumothorax. The heart is normal in size; the mediastinal contour is within normal limits. No acute osseous abnormalities are seen. IMPRESSION: No acute cardiopulmonary process seen. Electronically Signed   By: Garald Balding M.D.   On: 09/05/2017 04:13    Procedures Procedures (including critical care time)  Medications Ordered in ED Medications  metoCLOPramide (REGLAN) injection 10 mg (10 mg Intravenous Given 09/05/17 0425)  fentaNYL (SUBLIMAZE) injection 50 mcg (50 mcg Intravenous Given 09/05/17 0426)  sodium chloride 0.9 % bolus 1,000 mL (0 mLs Intravenous Stopped 09/05/17 0620)  haloperidol lactate (HALDOL) injection 2.5 mg (2.5 mg Intravenous Given 09/05/17 0550)  LORazepam (ATIVAN) injection 1 mg (1 mg Intravenous Given 09/05/17 3474)     Initial Impression / Assessment and Plan / ED Course  I have reviewed the triage vital signs and the nursing notes.  Pertinent labs & imaging results that were available during my care of the patient were reviewed by me and considered in my medical decision making (see chart for details).     Patient with nausea vomiting diarrhea and abdominal pain.  History of same.  Continued tachycardia which appears to be somewhat typical for the patient.  Has had 31 visits for similar symptoms in the last 6 months.  Although some of these were for a buttock abscess.  Continued her symptoms after fentanyl Reglan fluids Haldol.  Urine still pending.  Ativan now given as this appears to work in the past.  Care will be turned over to Dr. Venora Maples.  Final Clinical Impressions(s) / ED Diagnoses   Final diagnoses:  Nausea vomiting and diarrhea  Chronic abdominal pain    ED Discharge Orders    None       Davonna Belling, MD 09/05/17 [phone]

## 2017-09-05 NOTE — ED Notes (Signed)
Patient in xray 

## 2017-09-05 NOTE — ED Triage Notes (Addendum)
Patient is complaining of nausea, vomiting, chest pain, and abdominal pain. Patient was seen on 09/03/2017. Patient states that it is worse today. Patient states she is having diarrhea and can not control her bowels.

## 2017-09-05 NOTE — ED Provider Notes (Signed)
7:16 AM Resting comfortably. HR 102. No vomiting. BP 129/116. 2nd liter of fluid finishing up now. Will reevaluate   Jola Schmidt, MD 09/05/17 [phone]

## 2017-09-17 ENCOUNTER — Encounter (HOSPITAL_COMMUNITY): Payer: Self-pay

## 2017-09-17 ENCOUNTER — Emergency Department (HOSPITAL_COMMUNITY)
Admission: EM | Admit: 2017-09-17 | Discharge: 2017-09-18 | Disposition: A | Discharge status: Home / Self Care | Payer: 59 | Attending: Emergency Medicine | Admitting: Emergency Medicine

## 2017-09-17 ENCOUNTER — Other Ambulatory Visit: Payer: Self-pay

## 2017-09-17 DIAGNOSIS — Z794 Long term (current) use of insulin: Secondary | ICD-10-CM | POA: Insufficient documentation

## 2017-09-17 DIAGNOSIS — Z79899 Other long term (current) drug therapy: Secondary | ICD-10-CM | POA: Diagnosis not present

## 2017-09-17 DIAGNOSIS — I10 Essential (primary) hypertension: Secondary | ICD-10-CM | POA: Insufficient documentation

## 2017-09-17 DIAGNOSIS — R000 Tachycardia, unspecified: Secondary | ICD-10-CM | POA: Diagnosis not present

## 2017-09-17 DIAGNOSIS — E119 Type 2 diabetes mellitus without complications: Secondary | ICD-10-CM | POA: Insufficient documentation

## 2017-09-17 DIAGNOSIS — R112 Nausea with vomiting, unspecified: Secondary | ICD-10-CM | POA: Diagnosis not present

## 2017-09-17 LAB — LIPASE, BLOOD: Lipase: 19 U/L (ref 11–51)

## 2017-09-17 LAB — CBC
HCT: 38.5 % (ref 36.0–46.0)
Hemoglobin: 13.4 g/dL (ref 12.0–15.0)
MCH: 29.8 pg (ref 26.0–34.0)
MCHC: 34.8 g/dL (ref 30.0–36.0)
MCV: 85.7 fL (ref 78.0–100.0)
Platelets: 176 10*3/uL (ref 150–400)
RBC: 4.49 MIL/uL (ref 3.87–5.11)
RDW: 11.7 % (ref 11.5–15.5)
WBC: 3.7 10*3/uL — ABNORMAL LOW (ref 4.0–10.5)

## 2017-09-17 LAB — COMPREHENSIVE METABOLIC PANEL
ALT: 20 U/L (ref 14–54)
AST: 24 U/L (ref 15–41)
Albumin: 4.3 g/dL (ref 3.5–5.0)
Alkaline Phosphatase: 93 U/L (ref 38–126)
Anion gap: 16 — ABNORMAL HIGH (ref 5–15)
BUN: 38 mg/dL — ABNORMAL HIGH (ref 6–20)
CO2: 27 mmol/L (ref 22–32)
Calcium: 9.8 mg/dL (ref 8.9–10.3)
Chloride: 97 mmol/L — ABNORMAL LOW (ref 101–111)
Creatinine, Ser: 0.89 mg/dL (ref 0.44–1.00)
GFR calc Af Amer: >60 mL/min (ref >60)
GFR calc non Af Amer: >60 mL/min (ref >60)
Glucose, Bld: 304 mg/dL — ABNORMAL HIGH (ref 65–99)
Potassium: 3.8 mmol/L (ref 3.5–5.1)
Sodium: 140 mmol/L (ref 135–145)
Total Bilirubin: 0.9 mg/dL (ref 0.3–1.2)
Total Protein: 8.8 g/dL — ABNORMAL HIGH (ref 6.5–8.1)

## 2017-09-17 LAB — ETHANOL: Alcohol, Ethyl (B): <10 mg/dL (ref <10)

## 2017-09-17 MED ORDER — HALOPERIDOL LACTATE 5 MG/ML IJ SOLN
2.5000 mg | INTRAMUSCULAR | Status: DC | PRN
  Administered 2017-09-17: 2.5 mg via INTRAVENOUS
  Filled 2017-09-17: qty 1

## 2017-09-17 MED ORDER — CAPSAICIN 0.025 % EX CREA
TOPICAL_CREAM | Freq: Two times a day (BID) | CUTANEOUS | Status: DC
  Administered 2017-09-17: 23:00:00 via TOPICAL
  Filled 2017-09-17: qty 60

## 2017-09-17 MED ORDER — SODIUM CHLORIDE 0.9 % IV BOLUS: 1000.0000 mL | Freq: Once | INTRAVENOUS | Status: DC

## 2017-09-17 MED ORDER — SUCRALFATE 1 G PO TABS: 1.0000 g | ORAL_TABLET | Freq: Three times a day (TID) | ORAL | 0 refills | Status: DC

## 2017-09-17 MED ORDER — ONDANSETRON HCL 4 MG/2ML IJ SOLN
4.0000 mg | Freq: Once | INTRAMUSCULAR | Status: AC
  Administered 2017-09-17: 4 mg via INTRAVENOUS
  Filled 2017-09-17: qty 2

## 2017-09-17 MED ORDER — SODIUM CHLORIDE 0.9 % IV BOLUS
1000.0000 mL | Freq: Once | INTRAVENOUS | Status: AC
  Administered 2017-09-17: 1000 mL via INTRAVENOUS

## 2017-09-17 MED ORDER — ONDANSETRON HCL 8 MG PO TABS: 8.0000 mg | ORAL_TABLET | Freq: Three times a day (TID) | ORAL | 0 refills | Status: DC | PRN

## 2017-09-17 NOTE — ED Notes (Signed)
Pt says she cannot urinate even though she has been to the bathroom 3/4x

## 2017-09-17 NOTE — ED Triage Notes (Signed)
Patient c/o nausea, emesis, abdominal pain today. Also reports centralized chest pain.

## 2017-09-17 NOTE — Discharge Instructions (Addendum)
Get plenty of rest and drink a lot of fluids.  Use the  Zostrix cream, 1 inch, rub done to the abdomen 3 or 4 times a day to help with your abdominal pain and vomiting.  See your primary care doctor soon as possible for further evaluation and treatment.

## 2017-09-17 NOTE — ED Provider Notes (Signed)
[facility]-EMERGENCY DEPT Provider Note   CSN: [id number] Arrival date & time: 09/17/17  2036     History   Chief Complaint Chief Complaint  Patient presents with  . Abdominal Pain  . Emesis    HPI [name] is a 27 y.o. female.  HPI   Reports abdominal pain with nausea and vomiting for several days, not improving with medicines which she takes at home including Carafate and Dramamine.  He has had multiple visits here recently with gastroparesis with similar presentation requiring various medicines for improvement.  She states she does not currently have a GI doctor.  She states that she has not followed up Carafate.  She denies hematemesis, diarrhea, cough, chest pain, weakness or dizziness.  There are no other known modifying factors.    Past Medical History:  Diagnosis Date  . Anxiety   . Diabetes type 1, uncontrolled (HCC) 11/14/2011   Since age 19  . Fibromyalgia   . Gastroparesis   . Hypertension   . Infection    UTI April 2016    Patient Active Problem List   Diagnosis Date Noted  . DKA (diabetic ketoacidoses) (HCC) 07/27/2017  . Normocytic anemia 07/10/2017  . SIRS (systemic inflammatory response syndrome) (HCC) 06/23/2017  . Hypokalemia 06/23/2017  . Normocytic normochromic anemia 06/23/2017  . GERD (gastroesophageal reflux disease) 06/23/2017  . Intractable nausea and vomiting 04/11/2017  . Nausea and vomiting 04/10/2017  . Essential hypertension   . Colitis   . Abscess and cellulitis of gluteal region 03/24/2017  . Cellulitis and abscess of buttock 03/23/2017  . Malnutrition of moderate degree 03/19/2017  . Anasarca 03/16/2017  . 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 Diarrhea 02/27/2017  . Lower GI bleed 02/27/2017  . DKA (diabetic ketoacidosis) (HCC) 02/26/2017  . Acute metabolic encephalopathy   . DKA, type 1 (HCC) 06/11/2013  . Orthostatic hypotension dysautonomic syndrome (HCC)  04/28/2013  . Body aches 04/28/2013  . Orthostatic hypotension 04/28/2013  . Diabetes mellitus type 1 (HCC) 04/28/2013  . Protein-calorie malnutrition, severe (HCC) 04/28/2013  . Noncompliance with diabetes treatment 09/01/2012  . Dyspnea 02/28/2012  . Sinus tachycardia 02/28/2012  . Hyperglycemia without ketosis 02/28/2012  . Chest pain 12/04/2011  . Insulin dependent diabetes mellitus (HCC) 11/14/2011  . Hypertension associated with diabetes (HCC) 11/14/2011  . Gastroparesis 11/14/2011  . Hyponatremia 09/19/2011  . Chronic abdominal pain 09/18/2011  . Metabolic alkalosis 09/18/2011  . Diabetic hyperosmolar non-ketotic state (HCC) 09/18/2011  . DIAB W/UNSPEC COMP TYPE I [JUV TYPE] UNCNTRL 06/04/2010  . CONSTIPATION 01/01/2010  . RECTAL PAIN 01/01/2010    Past Surgical History:  Procedure Laterality Date  . ANKLE SURGERY    . CHOLECYSTECTOMY  11/15/2011   Procedure: LAPAROSCOPIC CHOLECYSTECTOMY WITH INTRAOPERATIVE CHOLANGIOGRAM;  Surgeon: [name], MD;  Location: [facility];  Service: General;  Laterality: N/A;  . COLONOSCOPY WITH PROPOFOL N/A 06/27/2017   Procedure: COLONOSCOPY WITH PROPOFOL;  Surgeon: Rachael Fee, MD;  Location: [facility];  Service: Endoscopy;  Laterality: N/A;  . ESOPHAGOGASTRODUODENOSCOPY  12/03/2011   Procedure: ESOPHAGOGASTRODUODENOSCOPY (EGD);  Surgeon: Theda Belfast, MD;  Location: [facility];  Service: Endoscopy;  Laterality: N/A;  . FLEXIBLE SIGMOIDOSCOPY N/A 03/10/2017   Procedure: FLEXIBLE SIGMOIDOSCOPY;  Surgeon: Jeani Hawking, MD;  Location: [facility];  Service: Endoscopy;  Laterality: N/A;  . INCISION AND DRAINAGE PERIRECTAL ABSCESS N/A 03/01/2017   Procedure: IRRIGATION AND DEBRIDEMENT PERIRECTAL ABSCESS;  Surgeon: Ovidio Kin, MD;  Location: [facility];  Service: General;  Laterality: N/A;  . IRRIGATION AND DEBRIDEMENT BUTTOCKS N/A 03/23/2017   Procedure: IRRIGATION AND DEBRIDEMENT BUTTOCKS, SETON PLACEMENT;  Surgeon: Romie Levee, MD;   Location: [facility];  Service: General;  Laterality: N/A;  . LAPAROSCOPY  11/23/2011   Procedure: LAPAROSCOPY DIAGNOSTIC;  Surgeon: Mariella Saa, MD;  Location: [facility];  Service: General;  Laterality: N/A;     OB History    Gravida  1   Para  1   Term      Preterm  1   AB      Living  1     SAB      TAB      Ectopic      Multiple  0   Live Births  1            Home Medications    Prior to Admission medications   Medication Sig Start Date End Date Taking? Authorizing Provider  acetaminophen (TYLENOL) 500 MG tablet Take 1,000 mg by mouth daily as needed for moderate pain.    Yes [provider]  alum & mag hydroxide-simeth (MAALOX ADVANCED MAX ST) 400-400-40 MG/5ML suspension Take 10 mLs by mouth every 6 (six) hours as needed for indigestion.   Yes [provider]  citalopram (CELEXA) 20 MG tablet Take 1 tablet (20 mg total) by mouth daily. 07/13/17  Yes Emokpae, Courage, MD  diclofenac sodium (VOLTAREN) 1 % GEL Apply 2 g topically 4 (four) times daily. 08/01/17  Yes Mikhail, Maryann, DO  insulin lispro (HUMALOG) 100 UNIT/ML injection Inject 0.02 mLs (2 Units total) into the skin 3 (three) times daily with meals. Patient taking differently: Inject 0-9 Units into the skin 3 (three) times daily with meals. Sliding scale 04/09/17  Yes Romero Belling, MD  LANTUS 100 UNIT/ML injection Inject 0.12 mLs (12 Units total) into the skin daily. 07/13/17  Yes Shon Hale, MD  metoCLOPramide (REGLAN) 10 MG tablet Take 1 tablet (10 mg total) by mouth every 6 (six) hours as needed for nausea or vomiting. 09/02/17  Yes Jacalyn Lefevre, MD  cholestyramine light (PREVALITE) 4 g packet Take 1 packet (4 g total) by mouth 3 (three) times daily. Patient not taking: Reported on 09/02/2017 03/14/17   Rolly Salter, MD  famotidine (PEPCID) 20 MG tablet Take 1 tablet (20 mg total) by mouth 2 (two) times daily. Patient not taking: Reported on 09/03/2017 09/02/17   Jacalyn Lefevre,  MD  ferrous sulfate 325 (65 FE) MG tablet Take 1 tablet (325 mg total) by mouth 2 (two) times daily with a meal. 05/13/17   Henson, Vickie L, NP-C  lactobacillus (FLORANEX/LACTINEX) PACK Take 1 packet (1 g total) by mouth 3 (three) times daily with meals. Patient not taking: Reported on 08/16/2017 07/13/17   Shon Hale, MD  lipase/protease/amylase (CREON) 36000 UNITS CPEP capsule Take 1 capsule (36,000 Units total) by mouth 3 (three) times daily with meals. Patient not taking: Reported on 09/02/2017 03/14/17   Rolly Salter, MD  lisinopril (PRINIVIL,ZESTRIL) 5 MG tablet Take 1 tablet (5 mg total) by mouth daily. Patient not taking: Reported on 09/02/2017 08/02/17   Edsel Petrin, DO  ondansetron (ZOFRAN ODT) 4 MG disintegrating tablet Take 1 tablet (4 mg total) by mouth every 8 (eight) hours as needed for nausea or vomiting. 09/05/17   Azalia Bilis, MD  ondansetron (ZOFRAN) 8 MG tablet Take 1 tablet (8 mg total) by mouth every 8 (eight) hours as needed for nausea or vomiting.  09/17/17   Mancel Bale, MD  promethazine (PHENERGAN) 25 MG suppository Place 1 suppository (25 mg total) rectally every 6 (six) hours as needed for refractory nausea / vomiting. 09/05/17   Azalia Bilis, MD  sucralfate (CARAFATE) 1 g tablet Take 1 tablet (1 g total) by mouth 4 (four) times daily -  with meals and at bedtime. 09/17/17   Mancel Bale, MD    Family History Family History  Problem Relation Age of Onset  . Diabetes Mother   . Hypertension Father     Social History Social History   Tobacco Use  . Smoking status: Never Smoker  . Smokeless tobacco: Never Used  Substance Use Topics  . Alcohol use: No    Alcohol/week: 0.0 oz    Frequency: Never  . Drug use: No     Allergies   Other and Lactose intolerance (gi)   Review of Systems Review of Systems  All other systems reviewed and are negative.    Physical Exam Updated Vital Signs BP (!) 182/122   Pulse (!) 113   Resp 16   Ht 5\' 7"  (1.702  m)   Wt 63.5 kg (140 lb)   SpO2 100%   BMI 21.93 kg/m   Physical Exam  Constitutional: She is oriented to person, place, and time. She appears well-developed. She appears ill.  She appears under nourished.  She is moaning, crying, writhing on the bed.  HENT:  Head: Normocephalic and atraumatic.  Eyes: Pupils are equal, round, and reactive to light. Conjunctivae and EOM are normal.  Neck: Normal range of motion and phonation normal. Neck supple.  Cardiovascular: Normal rate and regular rhythm.  Pulmonary/Chest: Effort normal and breath sounds normal. She exhibits no tenderness.  Abdominal: Soft. She exhibits no distension. There is tenderness (Diffusely tender, mild). There is no guarding.  The abdomen is soft with palpation.  There are numerous well-healed surgical scars on the abdominal wall.  Musculoskeletal: Normal range of motion.  Neurological: She is alert and oriented to person, place, and time. She exhibits normal muscle tone.  Skin: Skin is warm and dry.  Psychiatric:  Tearful.  She does not appear to be responding to internal stimuli.  Nursing note and vitals reviewed.    ED Treatments / Results  Labs (all labs ordered are listed, but only abnormal results are displayed) Labs Reviewed  COMPREHENSIVE METABOLIC PANEL - Abnormal; Notable for the following components:      Result Value   Chloride 97 (*)    Glucose, Bld 304 (*)    BUN 38 (*)    Total Protein 8.8 (*)    Anion gap 16 (*)    All other components within normal limits  CBC - Abnormal; Notable for the following components:   WBC 3.7 (*)    All other components within normal limits  LIPASE, BLOOD  ETHANOL  URINALYSIS, ROUTINE W REFLEX MICROSCOPIC  PREGNANCY, URINE  RAPID URINE DRUG SCREEN, HOSP PERFORMED    EKG EKG Interpretation  Date/Time:  Thursday September 17 2017 20:49:00 EDT Ventricular Rate:  122 PR Interval:    QRS Duration: 75 QT Interval:  318 QTC Calculation: 453 R Axis:   74 Text  Interpretation:  Sinus tachycardia Atrial premature complex Probable left atrial enlargement Nonspecific T abnormalities, lateral leads Baseline wander in lead(s) V1 since last tracing no significant change Confirmed by Mancel Bale [phone]) on 09/17/2017 8:52:52 PM   Radiology No results found.  Procedures Procedures (including critical care time)  Medications Ordered  in ED Medications  sodium chloride 0.9 % bolus 1,000 mL (1,000 mLs Intravenous New Bag/Given 09/17/17 2253)  haloperidol lactate (HALDOL) injection 2.5 mg (2.5 mg Intravenous Given 09/17/17 2254)  capsaicin (ZOSTRIX) 0.025 % cream ( Topical Given 09/17/17 2254)  sodium chloride 0.9 % bolus 1,000 mL (has no administration in time range)  ondansetron (ZOFRAN) injection 4 mg (4 mg Intravenous Given 09/17/17 2253)     Initial Impression / Assessment and Plan / ED Course  I have reviewed the triage vital signs and the nursing notes.  Pertinent labs & imaging results that were available during my care of the patient were reviewed by me and considered in my medical decision making (see chart for details).  Clinical Course as of Sep 18 2350  Thu Sep 17, 2017  2321 Normal except chloride low, glucose high, BUN high, total protein high, anion gap high  Comprehensive metabolic panel(!) [EW]  2322 normal  Lipase, blood [EW]  2322 normal  Ethanol [EW]  2322 Normal except white count low  CBC(!) [EW]    Clinical Course User Index [EW] Mancel Bale, MD     Patient Vitals for the past 24 hrs:  BP Pulse Resp SpO2 Height Weight  09/17/17 2252 (!) 182/122 (!) 113 16 100 % - -  09/17/17 2048 - - - - 5\' 7"  (1.702 m) 63.5 kg (140 lb)  09/17/17 2047 91/68 (!) 124 (!) 22 100 % - -    11:52 PM Reevaluation with update and discussion. After initial assessment and treatment, an updated evaluation reveals patient is much more comfortable at this time, and states she only has "a little pain."  Findings discussed with patient, all  questions answered. Mancel Bale   Medical Decision Making: Patient with reported gastroparesis, with presentation for abdominal pain with vomiting.  Abdomen is soft and nondistended.  I doubt peritonitis, or bowel obstruction.  Nonspecific electrode abnormalities with mild elevation of anion gap.  Patient given 2 L IV fluids for hyperglycemia, and vomiting.  Patient treated with medications for gastroparesis, Haldol and Zostrix.  Patient last seen during hospitalization by gastroenterology in March 2019 at which time a new assessment it was noted that she actually did not have Crohn's which has previously been listed in her diagnosis list.  Was noted that she had frequent admissions, and evaluations for symptoms consistent with gastroparesis.  She also has had some perineal abscesses.  Patient had a colonoscopy March 2019 which showed normal colon in the ileum.  There was no evidence of Crohn's disease or other acute abnormalities.  He does not appear she has had any objective evaluation for gastroparesis.  CRITICAL CARE-no Performed by: Mancel Bale  Nursing Notes Reviewed/ Care Coordinated Applicable Imaging Reviewed Interpretation of Laboratory Data incorporated into ED treatment     Final Clinical Impressions(s) / ED Diagnoses   Final diagnoses:  Nausea and vomiting, intractability of vomiting not specified, unspecified vomiting type    ED Discharge Orders        Ordered    sucralfate (CARAFATE) 1 g tablet  3 times daily with meals & bedtime     09/17/17 2350    ondansetron (ZOFRAN) 8 MG tablet  Every 8 hours PRN     09/17/17 2350       Mancel Bale, MD 09/17/17 2353

## 2017-10-02 ENCOUNTER — Encounter: Payer: Self-pay | Admitting: Gastroenterology

## 2017-10-02 ENCOUNTER — Ambulatory Visit (INDEPENDENT_AMBULATORY_CARE_PROVIDER_SITE_OTHER): Payer: 59 | Admitting: Gastroenterology

## 2017-10-02 ENCOUNTER — Other Ambulatory Visit: Payer: 59

## 2017-10-02 VITALS — BP 104/54 | HR 62 | Ht 67.0 in | Wt 120.0 lb

## 2017-10-02 DIAGNOSIS — K3184 Gastroparesis: Secondary | ICD-10-CM | POA: Diagnosis not present

## 2017-10-02 DIAGNOSIS — K529 Noninfective gastroenteritis and colitis, unspecified: Secondary | ICD-10-CM | POA: Diagnosis not present

## 2017-10-02 MED ORDER — PROMETHAZINE HCL 12.5 MG PO TABS: 12.5000 mg | ORAL_TABLET | Freq: Three times a day (TID) | ORAL | 1 refills | Status: DC | PRN

## 2017-10-02 MED ORDER — PANTOPRAZOLE SODIUM 40 MG PO TBEC: 40.0000 mg | DELAYED_RELEASE_TABLET | Freq: Every day | ORAL | 3 refills | Status: DC

## 2017-10-02 MED ORDER — ONDANSETRON 4 MG PO TBDP: 4.0000 mg | ORAL_TABLET | Freq: Three times a day (TID) | ORAL | 1 refills | Status: DC | PRN

## 2017-10-02 NOTE — Patient Instructions (Addendum)
If you are age 28 or older, your body mass index should be between 23-30. Your Body mass index is 18.79 kg/m. If this is out of the aforementioned range listed, please consider follow up with your Primary Care Provider.  If you are age 36 or younger, your body mass index should be between 19-25. Your Body mass index is 18.79 kg/m. If this is out of the aformentioned range listed, please consider follow up with your Primary Care Provider.   You have been scheduled for an endoscopy. Please follow written instructions given to you at your visit today. If you use inhalers (even only as needed), please bring them with you on the day of your procedure. Your physician has requested that you go to www.startemmi.com and enter the access code given to you at your visit today. This web site gives a general overview about your procedure. However, you should still follow specific instructions given to you by our office regarding your preparation for the procedure.  We have sent the following medications to your pharmacy for you to pick up at your convenience: Protonix - Take once a day Zofran- Use as needed Phenergan (oral) - Every 8 hours as needed  Please go to the lab in the basement of our building to have lab work done as you leave today.   AFTER you submit your stool study, you can increase Imodium to three times a day.  Thank you for entrusting me with your care and for choosing [facility], Dr. Ileene Patrick

## 2017-10-02 NOTE — Progress Notes (Signed)
HPI :  27 y/o female with a history of DM and gastroparesis, HTN, chronic diarrhea, perirectal / perianal abscess here for new patient visit to discuss some of these issues.   She's had long-standing type 1 diabetes. She's had a gastric empty study in 2013 showing gastroparesis. Should EGD in 2013 which was not completed due to retained food. She said chronic issues with nausea and vomiting, epigastric pain. She's been on Reglan in the past for her gastroparesis. She was admitted in December for a prolonged hospital course in which she had severe malnourishment with very low albumin and anasarca and ascites. She was treated empirically for C. Difficile, given Creon, had tried Imodium and Lomotil. There is report of giving her cholestyramine with improvement, she doesn't recall that. She was readmitted in March with worsening abdominal pain and diarrhea. She had a colonoscopy performed by Dr. Christella Hartigan during that time which was normal. Biopsies showed no evidence of microscopic colitis and there was no evidence of Crohn's disease. She does have a history of perianal/perirectal abscess. That had been treated and she has not had any recent issues with that.  She states she has frequent loose stools, she is managing this with Imodium roughly 1-2 per day, and this reduces her stool frequency to 3 or 4 per day. Overall she states her diarrhea has been better on this regimen. She is uncertain if cholestyramine is helping at all, she is not taking at present because it is too hard of regimen for her to take. She has not been taking Creon as recommended previously. She does think that Creon did provide some help. She does notice some oil increase in her stool at times, I don't see that she is ever had a fecal elastase. She denies any perianal / rectal pain or discharge from her rectum.   Her main complaint is persistent nausea with episodic vomiting. Her nausea tends to come and go, it does not always worse with  eating but can be. She vomits about 4 days out of the week, has roughly 2 episodes of vomiting per day when she vomits. This is been worsened since her admission in March. She does have epigastric pain, can be worse with eating at times, this comes and goes. She does have ongoing reflux and heartburn which bothers her. She had been on omeprazole previously which she states did not help too much. She has not been on any antacids recently. She does think that she does better on a liquid diet. She has left lost some weight, around 20 pounds over the past several weeks. She states she tolerates fish and take chicken and soft things as well as liquid diets were then heavy meals. She states her diabetes becoming harder to control. She has been taking Reglan 10 mg 4 times a day. She states she's been on a regimen of Reglan since at least 2014. She is not sure if this helps much at all. She thinks Phenergan helps her more than anything, she does not think Zofran helps as much. She had been on omeprazole remotely but has not been on antacids recently.   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 Anxiety   . Diabetes type 1, uncontrolled (HCC) 11/14/2011   Since age 70  . Fibromyalgia   . Gastroparesis   . Hypertension   . Infection    UTI April 2016     Past Surgical History:  Procedure Laterality Date  . ANKLE SURGERY    . CHOLECYSTECTOMY  11/15/2011   Procedure: LAPAROSCOPIC CHOLECYSTECTOMY WITH INTRAOPERATIVE CHOLANGIOGRAM;  Surgeon: [name], MD;  Location: [facility];  Service: General;  Laterality: N/A;  .  COLONOSCOPY WITH PROPOFOL N/A 06/27/2017   Procedure: COLONOSCOPY WITH PROPOFOL;  Surgeon: Rachael Fee, MD;  Location: [facility];  Service: Endoscopy;  Laterality: N/A;  . ESOPHAGOGASTRODUODENOSCOPY  12/03/2011   Procedure: ESOPHAGOGASTRODUODENOSCOPY (EGD);  Surgeon: Theda Belfast, MD;  Location: [facility];  Service: Endoscopy;  Laterality: N/A;  . FLEXIBLE SIGMOIDOSCOPY N/A 03/10/2017   Procedure: FLEXIBLE SIGMOIDOSCOPY;  Surgeon: Jeani Hawking, MD;  Location: [facility];  Service: Endoscopy;  Laterality: N/A;  . INCISION AND DRAINAGE PERIRECTAL ABSCESS N/A 03/01/2017   Procedure: IRRIGATION AND DEBRIDEMENT PERIRECTAL ABSCESS;  Surgeon: Ovidio Kin, MD;  Location: [facility];  Service: General;  Laterality: N/A;  . IRRIGATION AND DEBRIDEMENT BUTTOCKS N/A 03/23/2017   Procedure: IRRIGATION AND DEBRIDEMENT BUTTOCKS, SETON PLACEMENT;  Surgeon: Romie Levee, MD;  Location: [facility];  Service: General;  Laterality: N/A;  . LAPAROSCOPY  11/23/2011   Procedure: LAPAROSCOPY DIAGNOSTIC;  Surgeon: Mariella Saa, MD;  Location: [facility];  Service: General;  Laterality: N/A;   Family History  Problem Relation Age of Onset  . Diabetes Mother   . Hypertension Father    Social History   Tobacco Use  . Smoking status: Never Smoker  . Smokeless tobacco: Never Used  Substance Use Topics  . Alcohol use: No    Alcohol/week: 0.0 oz    Frequency: Never  . Drug use: No   Current Outpatient Medications  Medication Sig Dispense Refill  . acetaminophen (TYLENOL) 500 MG tablet Take 1,000 mg by mouth daily as needed for moderate pain.     . cholestyramine light (PREVALITE) 4 g packet Take 1 packet (4 g total) by mouth 3 (three) times daily. 180 packet 0  . citalopram (CELEXA) 20 MG tablet Take 1 tablet (20 mg total) by mouth daily. 30 tablet 1  . diclofenac sodium (VOLTAREN) 1 % GEL Apply 2 g topically 4 (four) times daily. 100 g 0  . famotidine (PEPCID) 20 MG tablet Take 1 tablet (20 mg total) by  mouth 2 (two) times daily. 30 tablet 0  . ferrous sulfate 325 (65 FE) MG tablet Take 1 tablet (325 mg total) by mouth 2 (two) times daily with a meal. 60 tablet 0  . insulin lispro (HUMALOG) 100 UNIT/ML injection Inject 0.02 mLs (2 Units total) into the skin 3 (three) times daily with meals. (Patient taking differently: Inject 0-9 Units into the skin 3 (three) times daily with meals. Sliding scale) 10 mL 11  . LANTUS 100 UNIT/ML injection Inject 0.12 mLs (12 Units total) into the skin daily. 10 mL 2  . lipase/protease/amylase (CREON) 36000 UNITS CPEP capsule Take 1 capsule (36,000 Units total) by mouth 3 (three) times daily with meals. 270 capsule 0  . metoCLOPramide (REGLAN) 10 MG tablet Take 1 tablet (10 mg total) by mouth every 6 (six) hours as needed for nausea or vomiting. 20 tablet 0  . ondansetron (ZOFRAN ODT) 4 MG disintegrating tablet Take 1 tablet (4 mg  total) by mouth every 8 (eight) hours as needed for nausea or vomiting. 20 tablet 0  . ondansetron (ZOFRAN) 8 MG tablet Take 1 tablet (8 mg total) by mouth every 8 (eight) hours as needed for nausea or vomiting. 20 tablet 0  . promethazine (PHENERGAN) 25 MG suppository Place 1 suppository (25 mg total) rectally every 6 (six) hours as needed for refractory nausea / vomiting. 10 each 0  . sucralfate (CARAFATE) 1 g tablet Take 1 tablet (1 g total) by mouth 4 (four) times daily -  with meals and at bedtime. 120 tablet 0   No current facility-administered medications for this visit.    Allergies  Allergen Reactions  . Other Anaphylaxis    Reaction to [place] nuts   . Lactose Intolerance (Gi) Diarrhea     Review of Systems: All systems reviewed and negative except where noted in HPI.   Lab Results  Component Value Date   WBC 3.7 (L) 09/17/2017   HGB 13.4 09/17/2017   HCT 38.5 09/17/2017   MCV 85.7 09/17/2017   PLT 176 09/17/2017    Lab Results  Component Value Date   CREATININE 0.89 09/17/2017   BUN 38 (H) 09/17/2017   NA 140  09/17/2017   K 3.8 09/17/2017   CL 97 (L) 09/17/2017   CO2 27 09/17/2017    Lab Results  Component Value Date   ALT 20 09/17/2017   AST 24 09/17/2017   ALKPHOS 93 09/17/2017   BILITOT 0.9 09/17/2017     Physical Exam: BP (!) 104/54   Pulse 62   Ht 5\' 7"  (1.702 m)   Wt 120 lb (54.4 kg)   BMI 18.79 kg/m  Constitutional: Pleasant, thin female in no acute distress. HEENT: Normocephalic and atraumatic. Conjunctivae are normal. No scleral icterus. Neck supple.  Cardiovascular: Normal rate, regular rhythm.  Pulmonary/chest: Effort normal and breath sounds normal. No wheezing, rales or rhonchi. Abdominal: Soft, nondistended, nontender. . There are no masses palpable. No hepatomegaly. Extremities: no edema Lymphadenopathy: No cervical adenopathy noted. Neurological: Alert and oriented to person place and time. Skin: Skin is warm and dry. No rashes noted. Psychiatric: Normal mood and affect. Behavior is normal.   ASSESSMENT AND PLAN: 27 year old female with a history of type 1 diabetes and history of gastroparesis, chronic diarrhea, history of perirectal abscess, here for a follow-up visit after hospitalization and endoscopic evaluation by Dr. Christella Hartigan in March.  Gastroparesis - she has chronic nausea and vomiting, much worse over the past 3 months, poorly controlled on her present regimen. She does have prior gastric emptying study showing gastroparesis, however she's never had completed EGD, one prior attempt was aborted due to retained food in the stomach. Given her worsening symptoms I do think she warrants a complete EGD, ensure no outlet obstruction, blood peptic ulcer disease, rule out H. Pylori. Following discussion of EGD and anesthesia with her, and she wanted to proceed. I have an opening in 2 days and she'll have this done then. Recommend she stay on a liquid diet until that time to ensure her stomach is empty is we have adequate views. This may also help her feel better in  light of her gastroparesis. Moving forward we'll place her on a trial of Protonix 40 mg once daily. We'll also schedule Phenergan 12.5 mg every 8 hours if she tolerates this, she can use Zofran as needed. If her EGD is normal, then the Reglan is clearly not working for her gastroparesis. We may consider a trial  of domperidone moving forward. She agreed with the plan.  Chronic diarrhea - she does not have evidence of Crohn's disease on her recent colonoscopy, although history of rectal abscess / fistula raises this question. Biopsies negative for microscopic colitis. Recent labs look good, normal WBC, no anemia. May obtain biopsies of the small bowel during upcoming EGD to ensure okay. Otherwise we'll send pancreatic elastase to assess for pancreatic insufficiency. She can increase her Imodium as needed, she reports this does help her more than anything. She will go up to 3 times a day and titrate to one or 2 loose stools per day. If she develops any rectal discomfort moving forward in regards to recurrence of abscess she will contact us  She agreed the plan as outlined.  Note this is the first time I am seeing this patient, she was more recently seen by Dr. Christella Hartigan, I will discuss with him who she will follow with for long-term management  Ileene Patrick, MD [facility]

## 2017-10-05 ENCOUNTER — Ambulatory Visit (AMBULATORY_SURGERY_CENTER): Payer: 59 | Admitting: Gastroenterology

## 2017-10-05 ENCOUNTER — Encounter: Payer: Self-pay | Admitting: Gastroenterology

## 2017-10-05 ENCOUNTER — Other Ambulatory Visit: Payer: Self-pay

## 2017-10-05 VITALS — BP 126/80 | HR 103 | Temp 98.4°F | Resp 18 | Ht 67.0 in | Wt 120.0 lb

## 2017-10-05 DIAGNOSIS — K529 Noninfective gastroenteritis and colitis, unspecified: Secondary | ICD-10-CM | POA: Diagnosis not present

## 2017-10-05 DIAGNOSIS — K3184 Gastroparesis: Secondary | ICD-10-CM | POA: Diagnosis not present

## 2017-10-05 DIAGNOSIS — K2950 Unspecified chronic gastritis without bleeding: Secondary | ICD-10-CM | POA: Diagnosis not present

## 2017-10-05 MED ORDER — SODIUM CHLORIDE 0.9 % IV SOLN: 500.0000 mL | Freq: Once | INTRAVENOUS | Status: DC

## 2017-10-05 NOTE — Progress Notes (Signed)
A/ox3 pleased with MAC, report to RN 

## 2017-10-05 NOTE — Progress Notes (Signed)
Called to room to assist during endoscopic procedure.  Patient ID and intended procedure confirmed with present staff. Received instructions for my participation in the procedure from the performing physician.  

## 2017-10-05 NOTE — Op Note (Signed)
[facility] Patient Name: [name] Procedure Date: 10/05/2017 2:27 PM MRN: [id number] Endoscopist: Viviann Spare P. Adela Lank , MD Age: 27 Referring MD:  Date of Birth: 08-11-1990 Gender: Female Account #: [id number] Procedure:                Upper GI endoscopy Indications:              chronic diarrhea, persistent nausea with vomiting,                            history of suspected gastroparesis but no prior                            completed EGD (remote exam unable to examined the                            stomach due to retained food), on liquid diet for 2                            days Medicines:                Monitored Anesthesia Care Procedure:                Pre-Anesthesia Assessment:                           - Prior to the procedure, a History and Physical                            was performed, and patient medications and                            allergies were reviewed. The patient's tolerance of                            previous anesthesia was also reviewed. The risks                            and benefits of the procedure and the sedation                            options and risks were discussed with the patient.                            All questions were answered, and informed consent                            was obtained. Prior Anticoagulants: The patient has                            taken no previous anticoagulant or antiplatelet                            agents. ASA Grade Assessment: II - A patient with  mild systemic disease. After reviewing the risks                            and benefits, the patient was deemed in                            satisfactory condition to undergo the procedure.                           After obtaining informed consent, the endoscope was                            passed under direct vision. Throughout the                            procedure, the patient's blood pressure, pulse,  and                            oxygen saturations were monitored continuously. The                            Model GIF-[id number] scope was introduced                            through the mouth, and advanced to the second part                            of duodenum. The upper GI endoscopy was                            accomplished without difficulty. The patient                            tolerated the procedure well. Scope In: Scope Out: Findings:                 Esophagogastric landmarks were identified: the                            Z-line was found at 36 cm, the gastroesophageal                            junction was found at 36 cm and the upper extent of                            the gastric folds was found at 36 cm from the                            incisors.                           The exam of the esophagus was otherwise normal.                           A medium  amount of residual fluid / food was found                            in the gastric fundus and in the gastric body,                            consistent with gastroparesis. This limited the                            evaluated of the gastric body and fundus.                           The exam of the stomach was otherwise normal. No                            outlet obstruction.                           Biopsies were taken with a cold forceps in the                            gastric body, at the incisura and in the gastric                            antrum for Helicobacter pylori testing.                           The duodenal bulb and second portion of the                            duodenum were normal. Biopsies for histology were                            taken with a cold forceps for evaluation of celiac                            disease. Complications:            No immediate complications. Estimated blood loss:                            Minimal. Estimated Blood Loss:     Estimated blood loss  was minimal. Impression:               - Esophagogastric landmarks identified.                           - Normal esophagus                           - A medium amount of food (residue) in the stomach                            consistent with gastroparesis                           -  Normal stomach otherwise, no outlet obstruction,                            biopsies taken to rule out H pylori                           - Normal duodenal bulb and second portion of the                            duodenum. Biopsied. Recommendation:           - Patient has a contact number available for                            emergencies. The signs and symptoms of potential                            delayed complications were discussed with the                            patient. Return to normal activities tomorrow.                            Written discharge instructions were provided to the                            patient.                           - Gastroparesis diet.                           - Continue present medications.                           - Await pathology results.                           - Consideration for trial of Domperidone or                            Prucalapride, will discuss with patient [name]. Kathryn Popoca, MD 10/05/2017 2:53:43 PM This report has been signed electronically.

## 2017-10-05 NOTE — Patient Instructions (Signed)
Handouts on Gastroparesis Diet given to patient. Consider meds for gastroparesis as discussed by Dr Havery Moros.  YOU HAD AN ENDOSCOPIC PROCEDURE TODAY AT [facility]:   Refer to the procedure report that was given to you for any specific questions about what was found during the examination.  If the procedure report does not answer your questions, please call your gastroenterologist to clarify.  If you requested that your care partner not be given the details of your procedure findings, then the procedure report has been included in a sealed envelope for you to review at your convenience later.  YOU SHOULD EXPECT: Some feelings of bloating in the abdomen. Passage of more gas than usual.  Walking can help get rid of the air that was put into your GI tract during the procedure and reduce the bloating. If you had a lower endoscopy (such as a colonoscopy or flexible sigmoidoscopy) you may notice spotting of blood in your stool or on the toilet paper. If you underwent a bowel prep for your procedure, you may not have a normal bowel movement for a few days.  Please Note:  You might notice some irritation and congestion in your nose or some drainage.  This is from the oxygen used during your procedure.  There is no need for concern and it should clear up in a day or so.  SYMPTOMS TO REPORT IMMEDIATELY:    Following upper endoscopy (EGD)  Vomiting of blood or coffee ground material  New chest pain or pain under the shoulder blades  Painful or persistently difficult swallowing  New shortness of breath  Fever of 100F or higher  Black, tarry-looking stools  For urgent or emergent issues, a gastroenterologist can be reached at any hour by calling [phone].   DIET:  We do recommend a small meal at first, but then you may proceed to your regular diet.  Drink plenty of fluids but you should avoid alcoholic beverages for 24 hours.  ACTIVITY:  You should plan to take it easy for the  rest of today and you should NOT DRIVE or use heavy machinery until tomorrow (because of the sedation medicines used during the test).    FOLLOW UP: Our staff will call the number listed on your records the next business day following your procedure to check on you and address any questions or concerns that you may have regarding the information given to you following your procedure. If we do not reach you, we will leave a message.  However, if you are feeling well and you are not experiencing any problems, there is no need to return our call.  We will assume that you have returned to your regular daily activities without incident.  If any biopsies were taken you will be contacted by phone or by letter within the next 1-3 weeks.  Please call us at [phone] if you have not heard about the biopsies in 3 weeks.    SIGNATURES/CONFIDENTIALITY: You and/or your care partner have signed paperwork which will be entered into your electronic medical record.  These signatures attest to the fact that that the information above on your After Visit Summary has been reviewed and is understood.  Full responsibility of the confidentiality of this discharge information lies with you and/or your care-partner.

## 2017-10-05 NOTE — Progress Notes (Signed)
Pt reported Right ankle has metal in it. maw

## 2017-10-05 NOTE — Progress Notes (Addendum)
Pt reported she took this am Humalog 4 units. On a sliding scale.  Blood sugar was 97 on admission. Recovery nurse and CRNA was notified.maw

## 2017-10-06 ENCOUNTER — Telehealth: Payer: Self-pay

## 2017-10-06 ENCOUNTER — Telehealth: Payer: Self-pay | Admitting: *Deleted

## 2017-10-06 NOTE — Telephone Encounter (Signed)
Attempted to reach patient for post-procedure f/u call. No answer. Left message that we will make another attempt to reach her later today and for her to please not hesitate to call us if she has any questions/concerns regarding her care. 

## 2017-10-06 NOTE — Telephone Encounter (Signed)
  Follow up Call-  Call back number 10/05/2017  Post procedure Call Back phone  # #[phone] cell  Permission to leave phone message Yes  Some recent data might be hidden     Patient questions:  Do you have a fever, pain , or abdominal swelling? No. Pain Score  0 *  Have you tolerated food without any problems? Yes.    Have you been able to return to your normal activities? Yes.    Do you have any questions about your discharge instructions: Diet   No. Medications  No. Follow up visit  No.  Do you have questions or concerns about your Care? No.  Actions: * If pain score is 4 or above: No action needed, pain <4.

## 2017-10-20 ENCOUNTER — Other Ambulatory Visit: Payer: Self-pay | Admitting: Gastroenterology

## 2017-10-20 ENCOUNTER — Telehealth: Payer: Self-pay

## 2017-10-20 DIAGNOSIS — K3184 Gastroparesis: Secondary | ICD-10-CM

## 2017-10-20 MED ORDER — MIRTAZAPINE 15 MG PO TABS: 15.0000 mg | ORAL_TABLET | Freq: Every day | ORAL | 3 refills | Status: DC

## 2017-10-20 NOTE — Telephone Encounter (Signed)
-----   Message from Benancio Deeds, MD sent at 10/20/2017  4:21 PM EDT ----- Vanessa Kick can you help book this patient a follow up in 2 months or so? Thanks

## 2017-10-20 NOTE — Telephone Encounter (Signed)
Called and scheduled pt for OV with Dr. Mervyn Skeeters in late Sept.

## 2017-11-26 ENCOUNTER — Encounter (HOSPITAL_COMMUNITY): Payer: Self-pay | Admitting: Emergency Medicine

## 2017-11-26 ENCOUNTER — Emergency Department (HOSPITAL_COMMUNITY)
Admission: EM | Admit: 2017-11-26 | Discharge: 2017-11-26 | Disposition: A | Discharge status: Home / Self Care | Payer: 59 | Attending: Emergency Medicine | Admitting: Emergency Medicine

## 2017-11-26 ENCOUNTER — Other Ambulatory Visit: Payer: Self-pay

## 2017-11-26 DIAGNOSIS — E109 Type 1 diabetes mellitus without complications: Secondary | ICD-10-CM | POA: Diagnosis not present

## 2017-11-26 DIAGNOSIS — L03211 Cellulitis of face: Secondary | ICD-10-CM | POA: Insufficient documentation

## 2017-11-26 DIAGNOSIS — Z79899 Other long term (current) drug therapy: Secondary | ICD-10-CM | POA: Diagnosis not present

## 2017-11-26 DIAGNOSIS — I10 Essential (primary) hypertension: Secondary | ICD-10-CM | POA: Insufficient documentation

## 2017-11-26 DIAGNOSIS — L03811 Cellulitis of head [any part, except face]: Secondary | ICD-10-CM | POA: Diagnosis not present

## 2017-11-26 DIAGNOSIS — M797 Fibromyalgia: Secondary | ICD-10-CM | POA: Insufficient documentation

## 2017-11-26 DIAGNOSIS — L0201 Cutaneous abscess of face: Secondary | ICD-10-CM | POA: Diagnosis present

## 2017-11-26 DIAGNOSIS — L02811 Cutaneous abscess of head [any part, except face]: Secondary | ICD-10-CM

## 2017-11-26 LAB — CBG MONITORING, ED: Glucose-Capillary: 195 mg/dL — ABNORMAL HIGH (ref 70–99)

## 2017-11-26 MED ORDER — DOXYCYCLINE HYCLATE 100 MG PO CAPS: 100.0000 mg | ORAL_CAPSULE | Freq: Two times a day (BID) | ORAL | 0 refills | Status: DC

## 2017-11-26 MED ORDER — LIDOCAINE-EPINEPHRINE (PF) 2 %-1:200000 IJ SOLN
10.0000 mL | Freq: Once | INTRAMUSCULAR | Status: AC
  Administered 2017-11-26: 10 mL
  Filled 2017-11-26: qty 20

## 2017-11-26 NOTE — Discharge Instructions (Addendum)
He was seen in the emergency department for what appears to be a skin infection, otherwise known as an abscess with surrounding cellulitis.  Please see the attached handout for further information regarding these diagnoses.  The abscess was incised and drained in the emergency department.  We would like you to apply warm compresses to this area 4-5 times per day for the next 48 to 72 hours.  We are also starting on an antibiotic to help with the infection, doxycycline. Please take all of your antibiotics until finished. You may develop abdominal discomfort or diarrhea from the antibiotic.  You may help offset this with probiotics which you can buy at the store (ask your pharmacist if unable to find) or get probiotics in the form of eating yogurt. Do not eat or take the probiotics until 2 hours after your antibiotic. If you are unable to tolerate these side effects follow-up with your primary care provider or return to the emergency department.   If you begin to experience any blistering, rashes, swelling, or difficulty breathing seek medical care for evaluation of potentially more serious side effects.   Please be aware that this medication may interact with other medications you are taking, please be sure to discuss your medication list with your pharmacist. If you are taking birth control the antibiotic will deactivate your birth control for 2 weeks. If on coumadin the antibiotic will effect your coumadin level.    Would like you to continue to take Tylenol/Motrin per over-the-counter dosing instructions for any continued discomfort.  Please return to the ER, go to an urgent care, or see your primary care provider for a wound recheck in 2 to 3 days.  Return to the ER sooner for new or worsening symptoms including but not limited to increased swelling, increased pain, difficulty swallowing, difficulty breathing, trouble moving your neck, change in your voice, swelling underneath your tongue, fever,  spreading redness, or any other concerns that you may have.  Additionally be sure to monitor your blood sugar at home, it was 195 in the emergency department today, please take your diabetic medications as prescribed.

## 2017-11-26 NOTE — ED Notes (Signed)
CBG 195 

## 2017-11-26 NOTE — ED Notes (Signed)
Bed: WTR9 Expected date:  Expected time:  Means of arrival:  Comments: 

## 2017-11-26 NOTE — ED Triage Notes (Signed)
Pt c/o pain / abscess to L cheek x 5 days.

## 2017-11-26 NOTE — ED Provider Notes (Addendum)
[facility] DEPT Provider Note   CSN: [id number] Arrival date & time: 11/26/17  1117     History   Chief Complaint Chief Complaint  Patient presents with  . Abscess    L cheek    HPI [name] is a 27 y.o. female with a history of T1DM, anxiety, anemia, depression, fibromyalgia, and hypertension who presents to the emergency department with left-sided facial abscess which she first noted 1 week ago and has been progressively worsening.  She notes that she has swelling, redness, and pain to the skin over the left jaw area.  Current pain is an 8 out of 10 in severity.  No specific alleviating or aggravating factors.  Has not tried intervention prior to arrival.  She has not had any drainage from the area.  Denies significant dental pain, sore throat, trouble swallowing, difficulty moving the neck, swelling under her tongue, or fevers,.  States her blood glucose at home has been variable, reading last night was 190, has not checked this morning.  HPI  Past Medical History:  Diagnosis Date  . Allergy   . Anemia   . Anxiety   . Blood transfusion without reported diagnosis    Dec 2018  . Cataract    right eye  . Depression   . Diabetes type 1, uncontrolled ([place]) 11/14/2011   Since age 27  . Fibromyalgia   . Gastroparesis   . GERD (gastroesophageal reflux disease)   . Hypertension   . Infection    UTI April 2016    Patient Active Problem List   Diagnosis Date Noted  . DKA (diabetic ketoacidoses) ([place]) 07/27/2017  . Normocytic anemia 07/10/2017  . SIRS (systemic inflammatory response syndrome) ([place]) 06/23/2017  . Hypokalemia 06/23/2017  . Normocytic normochromic anemia 06/23/2017  . GERD (gastroesophageal reflux disease) 06/23/2017  . Intractable nausea and vomiting 04/11/2017  . Nausea and vomiting 04/10/2017  . Essential hypertension   . Colitis   . Abscess and cellulitis of gluteal region 03/24/2017  . Cellulitis and abscess of  buttock 03/23/2017  . Malnutrition of moderate degree 03/19/2017  . Anasarca 03/16/2017  . MDD (major depressive disorder), recurrent episode, mild ([place]) 02/28/2017  . Diarrhea 02/27/2017  . Lower GI bleed 02/27/2017  . DKA (diabetic ketoacidosis) ([place]) 02/26/2017  . Acute metabolic encephalopathy   . DKA, type 1 ([place]) 06/11/2013  . Orthostatic hypotension dysautonomic syndrome ([place]) 04/28/2013  . Body aches 04/28/2013  . Orthostatic hypotension 04/28/2013  . Diabetes mellitus type 1 ([place]) 04/28/2013  . Protein-calorie malnutrition, severe ([place]) 04/28/2013  . Noncompliance with diabetes treatment 09/01/2012  . Dyspnea 02/28/2012  . Sinus tachycardia 02/28/2012  . Hyperglycemia without ketosis 02/28/2012  . Chest pain 12/04/2011  . Insulin dependent diabetes mellitus ([place]) 11/14/2011  . Hypertension associated with diabetes ([place]) 11/14/2011  . Gastroparesis 11/14/2011  . Hyponatremia 09/19/2011  . Chronic abdominal pain 09/18/2011  . Metabolic alkalosis 68/34/1962  . Diabetic hyperosmolar non-ketotic state ([place]) 09/18/2011  . DIAB W/UNSPEC COMP TYPE I [JUV TYPE] UNCNTRL 06/04/2010  . CONSTIPATION 01/01/2010  . RECTAL PAIN 01/01/2010    Past Surgical History:  Procedure Laterality Date  . ANKLE SURGERY    . CHOLECYSTECTOMY  11/15/2011   Procedure: LAPAROSCOPIC CHOLECYSTECTOMY WITH INTRAOPERATIVE CHOLANGIOGRAM;  Surgeon: [name], MD;  Location: [facility];  Service: General;  Laterality: N/A;  . COLONOSCOPY    . COLONOSCOPY WITH PROPOFOL N/A 06/27/2017   Procedure: COLONOSCOPY WITH PROPOFOL;  Surgeon: Milus Banister, MD;  Location: [facility];  Service: Endoscopy;  Laterality: N/A;  . ESOPHAGOGASTRODUODENOSCOPY  12/03/2011   Procedure: ESOPHAGOGASTRODUODENOSCOPY (EGD);  Surgeon: Beryle Beams, MD;  Location: [facility];  Service: Endoscopy;  Laterality: N/A;  . FLEXIBLE SIGMOIDOSCOPY N/A 03/10/2017   Procedure: FLEXIBLE SIGMOIDOSCOPY;  Surgeon: Carol Ada, MD;   Location: [facility];  Service: Endoscopy;  Laterality: N/A;  . INCISION AND DRAINAGE PERIRECTAL ABSCESS N/A 03/01/2017   Procedure: IRRIGATION AND DEBRIDEMENT PERIRECTAL ABSCESS;  Surgeon: Alphonsa Overall, MD;  Location: [facility];  Service: General;  Laterality: N/A;  . IRRIGATION AND DEBRIDEMENT BUTTOCKS N/A 03/23/2017   Procedure: IRRIGATION AND DEBRIDEMENT BUTTOCKS, SETON PLACEMENT;  Surgeon: [name], MD;  Location: [facility];  Service: General;  Laterality: N/A;  . LAPAROSCOPY  11/23/2011   Procedure: LAPAROSCOPY DIAGNOSTIC;  Surgeon: Edward Jolly, MD;  Location: [facility];  Service: General;  Laterality: N/A;  . SIGMOIDOSCOPY    . UPPER GASTROINTESTINAL ENDOSCOPY    . WISDOM TOOTH EXTRACTION       OB History    Gravida  1   Para  1   Term      Preterm  1   AB      Living  1     SAB      TAB      Ectopic      Multiple  0   Live Births  1            Home Medications    Prior to Admission medications   Medication Sig Start Date End Date Taking? Authorizing Provider  acetaminophen (TYLENOL) 500 MG tablet Take 1,000 mg by mouth daily as needed for moderate pain.     [provider]  cholestyramine light (PREVALITE) 4 g packet Take 1 packet (4 g total) by mouth 3 (three) times daily. 03/14/17   Lavina Hamman, MD  citalopram (CELEXA) 20 MG tablet Take 1 tablet (20 mg total) by mouth daily. 07/13/17   Roxan Hockey, MD  diclofenac sodium (VOLTAREN) 1 % GEL Apply 2 g topically 4 (four) times daily. 08/01/17   Mikhail, Velta Addison, DO  famotidine (PEPCID) 20 MG tablet Take 1 tablet (20 mg total) by mouth 2 (two) times daily. Patient not taking: Reported on 10/05/2017 09/02/17   Isla Pence, MD  ferrous sulfate 325 (65 FE) MG tablet Take 1 tablet (325 mg total) by mouth 2 (two) times daily with a meal. 05/13/17   Henson, Vickie L, NP-C  insulin lispro (HUMALOG) 100 UNIT/ML injection Inject 0.02 mLs (2 Units total) into the skin 3 (three) times daily with  meals. Patient taking differently: Inject 0-9 Units into the skin 3 (three) times daily with meals. Sliding scale 04/09/17   Renato Shin, MD  LANTUS 100 UNIT/ML injection Inject 0.12 mLs (12 Units total) into the skin daily. 07/13/17   Roxan Hockey, MD  lipase/protease/amylase (CREON) 36000 UNITS CPEP capsule Take 1 capsule (36,000 Units total) by mouth 3 (three) times daily with meals. Patient not taking: Reported on 10/05/2017 03/14/17   Lavina Hamman, MD  metoCLOPramide (REGLAN) 10 MG tablet Take 1 tablet (10 mg total) by mouth every 6 (six) hours as needed for nausea or vomiting. 09/02/17   Isla Pence, MD  mirtazapine (REMERON) 15 MG tablet Take 1 tablet (15 mg total) by mouth at bedtime. 10/20/17   Armbruster, Carlota Raspberry, MD  ondansetron (ZOFRAN ODT) 4 MG disintegrating tablet Take 1 tablet (4 mg total) by mouth every 8 (eight) hours as needed  for nausea or vomiting. 10/02/17   Armbruster, Carlota Raspberry, MD  ondansetron (ZOFRAN) 8 MG tablet Take 1 tablet (8 mg total) by mouth every 8 (eight) hours as needed for nausea or vomiting. 09/17/17   Daleen Bo, MD  pantoprazole (PROTONIX) 40 MG tablet Take 1 tablet (40 mg total) by mouth daily. 10/02/17   Armbruster, Carlota Raspberry, MD  promethazine (PHENERGAN) 12.5 MG tablet Take 1 tablet (12.5 mg total) by mouth every 8 (eight) hours as needed for nausea or vomiting. 10/02/17   Armbruster, Carlota Raspberry, MD  sucralfate (CARAFATE) 1 g tablet Take 1 tablet (1 g total) by mouth 4 (four) times daily -  with meals and at bedtime. 09/17/17   Daleen Bo, MD    Family History Family History  Problem Relation Age of Onset  . Diabetes Mother   . Hypertension Father   . Colon cancer Paternal Grandmother        pt thinks PGM was dx in her 50's  . Esophageal cancer Neg Hx   . Liver cancer Neg Hx   . Pancreatic cancer Neg Hx   . Stomach cancer Neg Hx   . Rectal cancer Neg Hx     Social History Social History   Tobacco Use  . Smoking status: Never Smoker  .  Smokeless tobacco: Never Used  Substance Use Topics  . Alcohol use: No    Alcohol/week: 0.0 standard drinks    Frequency: Never  . Drug use: No     Allergies   Other and Lactose intolerance (gi)   Review of Systems Review of Systems  Constitutional: Negative for chills and fever.  HENT: Negative for dental problem, drooling, ear pain, sore throat, trouble swallowing and voice change.   Respiratory: Negative for shortness of breath.   Cardiovascular: Negative for chest pain.  Skin: Positive for wound.     Physical Exam Updated Vital Signs BP 102/71   Pulse (!) 106   Temp 97.6 F (36.4 C)   Resp 18   SpO2 100%   Physical Exam  Constitutional: She appears well-developed and well-nourished.  Non-toxic appearance. No distress.  HENT:  Head: Normocephalic and atraumatic.    Right Ear: Tympanic membrane is not perforated, not erythematous, not retracted and not bulging.  Left Ear: Tympanic membrane is not perforated, not erythematous, not retracted and not bulging.  Nose: Nose normal.  Mouth/Throat: Uvula is midline and oropharynx is clear and moist. No uvula swelling. No oropharyngeal exudate, posterior oropharyngeal erythema or tonsillar abscesses.  Patient has poor dentition throughout.  Teeth are nontender.  No palpable gingival fluctuance or obvious dental abscess.  Patient is tolerating her own secretions without difficulty.  No trismus.  No drooling.  No hot potato voice.  Submandibular soft.  No swelling noted beneath the tongue.  Eyes: Conjunctivae are normal. Right eye exhibits no discharge. Left eye exhibits no discharge.  Neck: Normal range of motion and full passive range of motion without pain. Neck supple. No neck rigidity.  Cardiovascular: Regular rhythm. Tachycardia present.  No murmur heard. Pulmonary/Chest: Effort normal and breath sounds normal. No respiratory distress.  Lymphadenopathy:    She has no cervical adenopathy.  Neurological: She is alert.    Psychiatric: She has a normal mood and affect. Her behavior is normal. Thought content normal.  Nursing note and vitals reviewed.   ED Treatments / Results  Labs (all labs ordered are listed, but only abnormal results are displayed) Labs Reviewed  CBG MONITORING, ED - Abnormal; Notable  for the following components:      Result Value   Glucose-Capillary 195 (*)    All other components within normal limits    EKG None  Radiology No results found.  Procedures .Marland KitchenIncision and Drainage Date/Time: 11/26/2017 2:39 PM Performed by: Amaryllis Dyke, PA-C Authorized by: Amaryllis Dyke, PA-C   Consent:    Consent obtained:  Verbal   Consent given by:  Patient   Risks discussed:  Bleeding, damage to other organs, infection, incomplete drainage and pain   Alternatives discussed:  No treatment and alternative treatment Location:    Type:  Abscess   Size:  2.5   Location:  Head   Head location:  Face Pre-procedure details:    Skin preparation:  Betadine Anesthesia (see MAR for exact dosages):    Anesthesia method:  Local infiltration   Local anesthetic:  Lidocaine 2% WITH epi Procedure type:    Complexity:  Simple Procedure details:    Incision types:  Stab incision   Scalpel blade:  11   Wound management:  Probed and deloculated and irrigated with saline   Drainage:  Purulent and bloody   Drainage amount:  Copious   Wound treatment:  Wound left open   Packing materials:  None Post-procedure details:    Patient tolerance of procedure:  Tolerated well, no immediate complications   (including critical care time)  Medications Ordered in ED Medications  lidocaine-EPINEPHrine (XYLOCAINE W/EPI) 2 %-1:200000 (PF) injection 10 mL (10 mLs Infiltration Given 11/26/17 1336)     Initial Impression / Assessment and Plan / ED Course  I have reviewed the triage vital signs and the nursing notes.  Pertinent labs & imaging results that were available during my care of  the patient were reviewed by me and considered in my medical decision making (see chart for details).   Patient with a history of previously uncontrolled T1DM presents with left-sided facial abscess which appears to originate from the skin as opposed to intraorally.  Abscess present with moderate amount of surrounding induration.  Fluctuance/induration/skin changes do not extend to the submandibular/sub-submental/neck area.  Patient is tolerating her secretions without difficulty.  No trismus.  No drooling.  Normal phonation. Submandibular compartment is soft.  Full ROM of the neck. No evidence of Ludwig's angina at this time.  Abscess incised and drained per procedure note above.  Will place patient on doxycycline for antibiotic coverage due to surrounding cellulitis as well as patient with hx of T1DM, CBG elevated at 195, patient aware of this and need for close monitoring. Wound recheck in 2-3 days. I discussed results, treatment plan, need for follow-up, and return precautions with the patient. Provided opportunity for questions, patient confirmed understanding and is in agreement with plan.   Findings and plan of care discussed with supervising physician Dr. Zenia Resides who personally evaluated and examined this patient and is in agreement.   Final Clinical Impressions(s) / ED Diagnoses   Final diagnoses:  Cellulitis and abscess of head    ED Discharge Orders         Ordered    doxycycline (VIBRAMYCIN) 100 MG capsule  2 times daily     11/26/17 1443           Petrucelli, [place] R, PA-C 11/26/17 2155    Amaryllis Dyke, PA-C 11/26/17 2210    Lacretia Leigh, MD 11/27/17 [phone]

## 2017-12-17 ENCOUNTER — Emergency Department (HOSPITAL_COMMUNITY)
Admission: EM | Admit: 2017-12-17 | Discharge: 2017-12-17 | Disposition: A | Discharge status: Home / Self Care | Payer: 59 | Attending: Emergency Medicine | Admitting: Emergency Medicine

## 2017-12-17 ENCOUNTER — Other Ambulatory Visit: Payer: Self-pay

## 2017-12-17 ENCOUNTER — Encounter (HOSPITAL_COMMUNITY): Payer: Self-pay | Admitting: Emergency Medicine

## 2017-12-17 DIAGNOSIS — R112 Nausea with vomiting, unspecified: Secondary | ICD-10-CM

## 2017-12-17 DIAGNOSIS — R197 Diarrhea, unspecified: Secondary | ICD-10-CM | POA: Insufficient documentation

## 2017-12-17 DIAGNOSIS — I10 Essential (primary) hypertension: Secondary | ICD-10-CM | POA: Diagnosis not present

## 2017-12-17 DIAGNOSIS — Z79899 Other long term (current) drug therapy: Secondary | ICD-10-CM | POA: Insufficient documentation

## 2017-12-17 DIAGNOSIS — E109 Type 1 diabetes mellitus without complications: Secondary | ICD-10-CM | POA: Diagnosis not present

## 2017-12-17 DIAGNOSIS — R1013 Epigastric pain: Secondary | ICD-10-CM | POA: Insufficient documentation

## 2017-12-17 LAB — COMPREHENSIVE METABOLIC PANEL
ALT: 27 U/L (ref 0–44)
AST: 27 U/L (ref 15–41)
Albumin: 3.4 g/dL — ABNORMAL LOW (ref 3.5–5.0)
Alkaline Phosphatase: 84 U/L (ref 38–126)
Anion gap: 9 (ref 5–15)
BUN: 20 mg/dL (ref 6–20)
CO2: 31 mmol/L (ref 22–32)
Calcium: 9.5 mg/dL (ref 8.9–10.3)
Chloride: 99 mmol/L (ref 98–111)
Creatinine, Ser: 0.82 mg/dL (ref 0.44–1.00)
GFR calc Af Amer: >60 mL/min (ref >60)
GFR calc non Af Amer: >60 mL/min (ref >60)
Glucose, Bld: 96 mg/dL (ref 70–99)
Potassium: 4 mmol/L (ref 3.5–5.1)
Sodium: 139 mmol/L (ref 135–145)
Total Bilirubin: 0.6 mg/dL (ref 0.3–1.2)
Total Protein: 7.9 g/dL (ref 6.5–8.1)

## 2017-12-17 LAB — I-STAT BETA HCG BLOOD, ED (MC, WL, AP ONLY): I-stat hCG, quantitative: <5 m[IU]/mL (ref <5)

## 2017-12-17 LAB — URINALYSIS, ROUTINE W REFLEX MICROSCOPIC
Bilirubin Urine: NEGATIVE
Glucose, UA: 150 mg/dL — AB
Ketones, ur: NEGATIVE mg/dL
Leukocytes, UA: NEGATIVE
Nitrite: NEGATIVE
Protein, ur: 100 mg/dL — AB
Specific Gravity, Urine: 1.021 (ref 1.005–1.030)
pH: 5 (ref 5.0–8.0)

## 2017-12-17 LAB — CBC WITH DIFFERENTIAL/PLATELET
Basophils Absolute: 0 10*3/uL (ref 0.0–0.1)
Basophils Relative: 0 %
Eosinophils Absolute: 0.2 10*3/uL (ref 0.0–0.7)
Eosinophils Relative: 3 %
HCT: 33.6 % — ABNORMAL LOW (ref 36.0–46.0)
Hemoglobin: 11.5 g/dL — ABNORMAL LOW (ref 12.0–15.0)
Lymphocytes Relative: 58 %
Lymphs Abs: 2.9 10*3/uL (ref 0.7–4.0)
MCH: 29.2 pg (ref 26.0–34.0)
MCHC: 34.2 g/dL (ref 30.0–36.0)
MCV: 85.3 fL (ref 78.0–100.0)
Monocytes Absolute: 0.2 10*3/uL (ref 0.1–1.0)
Monocytes Relative: 4 %
Neutro Abs: 1.8 10*3/uL (ref 1.7–7.7)
Neutrophils Relative %: 35 %
Platelets: 274 10*3/uL (ref 150–400)
RBC: 3.94 MIL/uL (ref 3.87–5.11)
RDW: 12 % (ref 11.5–15.5)
WBC: 5 10*3/uL (ref 4.0–10.5)

## 2017-12-17 LAB — CBG MONITORING, ED: Glucose-Capillary: 91 mg/dL (ref 70–99)

## 2017-12-17 LAB — LIPASE, BLOOD: Lipase: 20 U/L (ref 11–51)

## 2017-12-17 MED ORDER — PROMETHAZINE HCL 25 MG/ML IJ SOLN
12.5000 mg | Freq: Once | INTRAMUSCULAR | Status: AC
  Administered 2017-12-17: 12.5 mg via INTRAVENOUS
  Filled 2017-12-17: qty 1

## 2017-12-17 MED ORDER — FENTANYL CITRATE (PF) 100 MCG/2ML IJ SOLN
50.0000 ug | Freq: Once | INTRAMUSCULAR | Status: AC
  Administered 2017-12-17: 50 ug via INTRAVENOUS
  Filled 2017-12-17: qty 2

## 2017-12-17 MED ORDER — DICYCLOMINE HCL 20 MG PO TABS: 20.0000 mg | ORAL_TABLET | Freq: Two times a day (BID) | ORAL | 0 refills | Status: DC

## 2017-12-17 MED ORDER — ONDANSETRON HCL 4 MG/2ML IJ SOLN
4.0000 mg | Freq: Once | INTRAMUSCULAR | Status: AC
  Administered 2017-12-17: 4 mg via INTRAVENOUS
  Filled 2017-12-17: qty 2

## 2017-12-17 MED ORDER — SODIUM CHLORIDE 0.9 % IV BOLUS
1000.0000 mL | Freq: Once | INTRAVENOUS | Status: AC
  Administered 2017-12-17: 1000 mL via INTRAVENOUS

## 2017-12-17 NOTE — ED Notes (Signed)
EKG given to EDP,Haviland,MD., for review. 

## 2017-12-17 NOTE — ED Provider Notes (Addendum)
[facility] DEPT Provider Note   CSN: [id number] Arrival date & time: 12/17/17  0446     History   Chief Complaint Chief Complaint  Patient presents with  . Abdominal Pain  . Emesis    HPI [name] is a 27 y.o. female with a history of uncontrolled, insulin dependent type 1 DM, gastroparesis, hypertension who presents emergency department today for epigastric abdominal pain with associated nausea, vomiting and diarrhea.  Patient reports that yesterday she began having epigastric abdominal pain that was constant, sharp and not better worsened by anything.  She reports she has constant nausea that is associated with this as well as nonbilious, nonbloody emesis.  She reports her last episode of emesis was at 4 AM this morning.  She reports she also has loose stool that is non-melanous, nonbloody in nature.  She reports her last episode of diarrhea was told earlier this morning around 5 AM.  Patient reports she has been trying her home omeprazole, Phenergan, Zofran and Remeron for symptoms without relief.  She states this feels different than her typical gastroparesis or DKA.  She denies she has not been able to eat or drink anything since the onset of her symptoms.  She denies history of pancreatitis.  She denies any alcohol use.  She reports history of cholecystectomy and an exploratory laparoscopic surgery.  She reports that her sugars have been running high at home but she is unsure of the numbers.  She has been compliant with her home insulin medications.  She denies any fevers, chills, chest pain, shortness of breath, cough, lower abdominal pain, pelvic pain, flank pain, vaginal discharge, vaginal bleeding, urinary frequency, urinary urgency, dysuria, hematuria, foul odor of urine.  HPI  Past Medical History:  Diagnosis Date  . Allergy   . Anemia   . Anxiety   . Blood transfusion without reported diagnosis    Dec 2018  . Cataract    right eye  .  Depression   . Diabetes type 1, uncontrolled ([place]) 11/14/2011   Since age 40  . Fibromyalgia   . Gastroparesis   . GERD (gastroesophageal reflux disease)   . Hypertension   . Infection    UTI April 2016    Patient Active Problem List   Diagnosis Date Noted  . DKA (diabetic ketoacidoses) ([place]) 07/27/2017  . Normocytic anemia 07/10/2017  . SIRS (systemic inflammatory response syndrome) ([place]) 06/23/2017  . Hypokalemia 06/23/2017  . Normocytic normochromic anemia 06/23/2017  . GERD (gastroesophageal reflux disease) 06/23/2017  . Intractable nausea and vomiting 04/11/2017  . Nausea and vomiting 04/10/2017  . Essential hypertension   . Colitis   . Abscess and cellulitis of gluteal region 03/24/2017  . Cellulitis and abscess of buttock 03/23/2017  . Malnutrition of moderate degree 03/19/2017  . Anasarca 03/16/2017  . MDD (major depressive disorder), recurrent episode, mild ([place]) 02/28/2017  . Diarrhea 02/27/2017  . Lower GI bleed 02/27/2017  . DKA (diabetic ketoacidosis) ([place]) 02/26/2017  . Acute metabolic encephalopathy   . DKA, type 1 ([place]) 06/11/2013  . Orthostatic hypotension dysautonomic syndrome ([employer]) 04/28/2013  . Body aches 04/28/2013  . Orthostatic hypotension 04/28/2013  . Diabetes mellitus type 1 ([place]) 04/28/2013  . Protein-calorie malnutrition, severe ([place]) 04/28/2013  . Noncompliance with diabetes treatment 09/01/2012  . Dyspnea 02/28/2012  . Sinus tachycardia 02/28/2012  . Hyperglycemia without ketosis 02/28/2012  . Chest pain 12/04/2011  . Insulin dependent diabetes mellitus ([place]) 11/14/2011  . Hypertension associated with diabetes ([place]) 11/14/2011  .  Gastroparesis 11/14/2011  . Hyponatremia 09/19/2011  . Chronic abdominal pain 09/18/2011  . Metabolic alkalosis 83/15/1761  . Diabetic hyperosmolar non-ketotic state ([place]) 09/18/2011  . DIAB W/UNSPEC COMP TYPE I [JUV TYPE] UNCNTRL 06/04/2010  . CONSTIPATION 01/01/2010  . RECTAL PAIN 01/01/2010    Past  Surgical History:  Procedure Laterality Date  . ANKLE SURGERY    . CHOLECYSTECTOMY  11/15/2011   Procedure: LAPAROSCOPIC CHOLECYSTECTOMY WITH INTRAOPERATIVE CHOLANGIOGRAM;  Surgeon: [name], MD;  Location: [facility];  Service: General;  Laterality: N/A;  . COLONOSCOPY    . COLONOSCOPY WITH PROPOFOL N/A 06/27/2017   Procedure: COLONOSCOPY WITH PROPOFOL;  Surgeon: Milus Banister, MD;  Location: [facility];  Service: Endoscopy;  Laterality: N/A;  . ESOPHAGOGASTRODUODENOSCOPY  12/03/2011   Procedure: ESOPHAGOGASTRODUODENOSCOPY (EGD);  Surgeon: Beryle Beams, MD;  Location: [facility];  Service: Endoscopy;  Laterality: N/A;  . FLEXIBLE SIGMOIDOSCOPY N/A 03/10/2017   Procedure: FLEXIBLE SIGMOIDOSCOPY;  Surgeon: Carol Ada, MD;  Location: [facility];  Service: Endoscopy;  Laterality: N/A;  . INCISION AND DRAINAGE PERIRECTAL ABSCESS N/A 03/01/2017   Procedure: IRRIGATION AND DEBRIDEMENT PERIRECTAL ABSCESS;  Surgeon: Alphonsa Overall, MD;  Location: [facility];  Service: General;  Laterality: N/A;  . IRRIGATION AND DEBRIDEMENT BUTTOCKS N/A 03/23/2017   Procedure: IRRIGATION AND DEBRIDEMENT BUTTOCKS, SETON PLACEMENT;  Surgeon: [name], MD;  Location: [facility];  Service: General;  Laterality: N/A;  . LAPAROSCOPY  11/23/2011   Procedure: LAPAROSCOPY DIAGNOSTIC;  Surgeon: Edward Jolly, MD;  Location: [facility];  Service: General;  Laterality: N/A;  . SIGMOIDOSCOPY    . UPPER GASTROINTESTINAL ENDOSCOPY    . WISDOM TOOTH EXTRACTION       OB History    Gravida  1   Para  1   Term      Preterm  1   AB      Living  1     SAB      TAB      Ectopic      Multiple  0   Live Births  1            Home Medications    Prior to Admission medications   Medication Sig Start Date End Date Taking? Authorizing Provider  acetaminophen (TYLENOL) 500 MG tablet Take 1,000 mg by mouth daily as needed for moderate pain.     [provider]  cholestyramine light (PREVALITE) 4  g packet Take 1 packet (4 g total) by mouth 3 (three) times daily. 03/14/17   Lavina Hamman, MD  citalopram (CELEXA) 20 MG tablet Take 1 tablet (20 mg total) by mouth daily. 07/13/17   Roxan Hockey, MD  diclofenac sodium (VOLTAREN) 1 % GEL Apply 2 g topically 4 (four) times daily. 08/01/17   Mikhail, Velta Addison, DO  doxycycline (VIBRAMYCIN) 100 MG capsule Take 1 capsule (100 mg total) by mouth 2 (two) times daily. 11/26/17   Petrucelli, Samantha R, PA-C  famotidine (PEPCID) 20 MG tablet Take 1 tablet (20 mg total) by mouth 2 (two) times daily. Patient not taking: Reported on 10/05/2017 09/02/17   Isla Pence, MD  ferrous sulfate 325 (65 FE) MG tablet Take 1 tablet (325 mg total) by mouth 2 (two) times daily with a meal. 05/13/17   Henson, Vickie L, NP-C  insulin lispro (HUMALOG) 100 UNIT/ML injection Inject 0.02 mLs (2 Units total) into the skin 3 (three) times daily with meals. Patient taking differently: Inject 0-9 Units into the skin 3 (three) times daily  with meals. Sliding scale 04/09/17   Renato Shin, MD  LANTUS 100 UNIT/ML injection Inject 0.12 mLs (12 Units total) into the skin daily. 07/13/17   Roxan Hockey, MD  lipase/protease/amylase (CREON) 36000 UNITS CPEP capsule Take 1 capsule (36,000 Units total) by mouth 3 (three) times daily with meals. Patient not taking: Reported on 10/05/2017 03/14/17   Lavina Hamman, MD  metoCLOPramide (REGLAN) 10 MG tablet Take 1 tablet (10 mg total) by mouth every 6 (six) hours as needed for nausea or vomiting. 09/02/17   Isla Pence, MD  mirtazapine (REMERON) 15 MG tablet Take 1 tablet (15 mg total) by mouth at bedtime. 10/20/17   Armbruster, Carlota Raspberry, MD  ondansetron (ZOFRAN ODT) 4 MG disintegrating tablet Take 1 tablet (4 mg total) by mouth every 8 (eight) hours as needed for nausea or vomiting. 10/02/17   Armbruster, Carlota Raspberry, MD  ondansetron (ZOFRAN) 8 MG tablet Take 1 tablet (8 mg total) by mouth every 8 (eight) hours as needed for nausea or vomiting.  09/17/17   Daleen Bo, MD  pantoprazole (PROTONIX) 40 MG tablet Take 1 tablet (40 mg total) by mouth daily. 10/02/17   Armbruster, Carlota Raspberry, MD  promethazine (PHENERGAN) 12.5 MG tablet Take 1 tablet (12.5 mg total) by mouth every 8 (eight) hours as needed for nausea or vomiting. 10/02/17   Armbruster, Carlota Raspberry, MD  sucralfate (CARAFATE) 1 g tablet Take 1 tablet (1 g total) by mouth 4 (four) times daily -  with meals and at bedtime. 09/17/17   Daleen Bo, MD    Family History Family History  Problem Relation Age of Onset  . Diabetes Mother   . Hypertension Father   . Colon cancer Paternal Grandmother        pt thinks PGM was dx in her 60's  . Esophageal cancer Neg Hx   . Liver cancer Neg Hx   . Pancreatic cancer Neg Hx   . Stomach cancer Neg Hx   . Rectal cancer Neg Hx     Social History Social History   Tobacco Use  . Smoking status: Never Smoker  . Smokeless tobacco: Never Used  Substance Use Topics  . Alcohol use: No    Alcohol/week: 0.0 standard drinks    Frequency: Never  . Drug use: No     Allergies   Other and Lactose intolerance (gi)   Review of Systems Review of Systems  All other systems reviewed and are negative.    Physical Exam Updated Vital Signs BP 100/84   Pulse (!) 102   Temp 97.9 F (36.6 C)   Resp 17   SpO2 100%   Physical Exam  Constitutional: She appears well-developed and well-nourished.  HENT:  Head: Normocephalic and atraumatic.  Right Ear: External ear normal.  Left Ear: External ear normal.  Nose: Nose normal.  Mouth/Throat: Uvula is midline and oropharynx is clear and moist. Mucous membranes are dry. No tonsillar exudate.  Eyes: Pupils are equal, round, and reactive to light. Right eye exhibits no discharge. Left eye exhibits no discharge. No scleral icterus.  Neck: Trachea normal. Neck supple. No spinous process tenderness present. No neck rigidity. Normal range of motion present.  Cardiovascular: Normal rate, regular rhythm  and intact distal pulses.  No murmur heard. Pulses:      Radial pulses are 2+ on the right side, and 2+ on the left side.       Dorsalis pedis pulses are 2+ on the right side, and 2+ on the  left side.       Posterior tibial pulses are 2+ on the right side, and 2+ on the left side.  No lower extremity swelling or edema. Calves symmetric in size bilaterally.  Pulmonary/Chest: Effort normal and breath sounds normal. She exhibits no tenderness.  Abdominal: Soft. Bowel sounds are normal. She exhibits no distension. There is no tenderness. There is no rigidity, no rebound, no guarding and no CVA tenderness.  Musculoskeletal: She exhibits no edema.  Lymphadenopathy:    She has no cervical adenopathy.  Neurological: She is alert.  Skin: Skin is warm and dry. No rash noted. She is not diaphoretic.  Psychiatric: She has a normal mood and affect.  Nursing note and vitals reviewed.    ED Treatments / Results  Labs (all labs ordered are listed, but only abnormal results are displayed) Labs Reviewed  CBC WITH DIFFERENTIAL/PLATELET - Abnormal; Notable for the following components:      Result Value   Hemoglobin 11.5 (*)    HCT 33.6 (*)    All other components within normal limits  COMPREHENSIVE METABOLIC PANEL - Abnormal; Notable for the following components:   Albumin 3.4 (*)    All other components within normal limits  URINALYSIS, ROUTINE W REFLEX MICROSCOPIC - Abnormal; Notable for the following components:   Glucose, UA 150 (*)    Hgb urine dipstick SMALL (*)    Protein, ur 100 (*)    Bacteria, UA RARE (*)    All other components within normal limits  LIPASE, BLOOD  I-STAT BETA HCG BLOOD, ED ([facility], [facility], [facility] ONLY)  CBG MONITORING, ED    EKG EKG Interpretation  Date/Time:  Thursday December 17 2017 07:15:10 EDT Ventricular Rate:  98 PR Interval:    QRS Duration: 79 QT Interval:  347 QTC Calculation: 443 R Axis:   80 Text Interpretation:  Sinus rhythm ST elev, probable normal  early repol pattern No significant change since last tracing Confirmed by Isla Pence [phone]) on 12/17/2017 7:17:13 AM Also confirmed by Isla Pence [phone]), editor Hattie Perch ([id number])  on 12/17/2017 7:24:11 AM   Radiology No results found.  Procedures Procedures (including critical care time)  Medications Ordered in ED Medications  sodium chloride 0.9 % bolus 1,000 mL (0 mLs Intravenous Stopped 12/17/17 0817)  ondansetron (ZOFRAN) injection 4 mg (4 mg Intravenous Given 12/17/17 0902)  promethazine (PHENERGAN) injection 12.5 mg (12.5 mg Intravenous Given 12/17/17 0903)  fentaNYL (SUBLIMAZE) injection 50 mcg (50 mcg Intravenous Given 12/17/17 0902)     Initial Impression / Assessment and Plan / ED Course  I have reviewed the triage vital signs and the nursing notes.  Pertinent labs & imaging results that were available during my care of the patient were reviewed by me and considered in my medical decision making (see chart for details).     27 year old female with a history of type 1 diabetes, gastroparesis who presents emergency department today for abdominal pain, nausea/vomiting/diarrhea.  Patient was noted to be hypotensive vital signs of 79/60 upon my arrival.  Patient was repositioned, and the proper cuff was placed.  Her vital signs improved to 97/85.  Patient was started on IV fluids and given nausea medication (EKG reviewed, no QT prolongation).  On exam patient did not appear to be dehydrated.  She is without any abdominal tenderness.  No peritoneal signs.  No indication for imaging at this time. After IVF patient vitals improved and she is tolerating PO fluids. Patient feels comfortable going home.   Patient  is nontoxic, nonseptic appearing, in no apparent distress.  Patient's pain and other symptoms adequately managed in emergency department.  Fluid bolus given.  Labs, imaging and vitals reviewed.  Patient does not meet the SIRS or Sepsis criteria.  On repeat exam  patient does not have a surgical abdomen and there are no peritoneal signs.  No indication of appendicitis, bowel obstruction, bowel perforation, DKA, UTI, kidney stone, pancreatitis, diverticulitis, or ectopic pregnancy. No significant electrolyte derangements. She was unable to provide a stool sample. Patient discharged home with symptomatic treatment (continue home medications and follow brat diet) and given strict instructions for follow-up with their primary care physician.  I have also discussed reasons to return immediately to the ER.  Patient expresses understanding and agrees with plan. Case was discussed with my attending who agrees with plan.   Blood pressure 100/84, pulse (!) 102, temperature 97.9 F (36.6 C), resp. rate 17, SpO2 100 %.  Final Clinical Impressions(s) / ED Diagnoses   Final diagnoses:  Nausea vomiting and diarrhea    ED Discharge Orders         Ordered    dicyclomine (BENTYL) 20 MG tablet  2 times daily     12/17/17 0909           Jillyn Ledger, PA-C 12/17/17 0909    Jillyn Ledger, PA-C 12/17/17 0149    Veryl Speak, MD 12/17/17 2300

## 2017-12-17 NOTE — ED Triage Notes (Signed)
Pt presents with abdominal pain, N/V/D. This is a chronic issue. Patient known to ED for same. Patient states episode began day before yesterday and has been unable to tolerate PO. Patient complaining of abdominal and epigastric pain.

## 2017-12-17 NOTE — Discharge Instructions (Signed)
Please read and follow all provided instructions.  You were seen here today for Nausea, Vomiting and Diarrhea.   Tests performed today include: Blood Work  Vital signs. See below for your results today.   Home Care Instructions Continue to hydrate orally. Continue you home nausea medications.  Read the instructions below for reasons to return to the ER. Do not take your medicine if develop an itchy rash, swelling in your mouth or lips, or difficulty breathing  The 'BRAT' diet is suggested, then progress to diet as tolerated as symptoms abate. Call if bloody stools, persistent diarrhea, vomiting, fever or abdominal pain. Bananas.  Rice.  Applesauce.  Toast (and other simple starches such as crackers, potatoes, noodles).   Follow any educational materials contained in this packet.  Follow-up instructions: Please follow-up with your primary care provider within the next 1 week, (by 12/24/17) for further evaluation of symptoms and treatment this week.    Return instructions:  Please return to the Emergency Department if you do not get better, if you get worse, or new symptoms OR You begin having localized abdominal pain that does not go away or becomes severe (The right side could  possibly be appendicitis. In an adult, the left lower portion of the abdomen could be colitis or diverticulitis) A temperature above 101 develops Repeated vomiting occurs (multiple uncontrollable episodes) or you are unable to keep fluids down Blood is being passed in stools or vomit (bright red or black tarry stools).  Return also if you develop chest pain, difficulty breathing, dizziness or fainting, or become confused, poorly responsive, or inconsolable (young children). Please return if you have any other emergent concerns.  Additional Information:  Your vital signs today were: BP 100/84    Pulse (!) 102    Temp 97.9 F (36.6 C)    Resp 17    SpO2 100%  If your blood pressure (BP) was elevated above  135/85 this visit, please have this repeated by your doctor within one month. ---------------

## 2017-12-18 ENCOUNTER — Emergency Department (HOSPITAL_COMMUNITY): Payer: 59

## 2017-12-18 ENCOUNTER — Other Ambulatory Visit: Payer: Self-pay

## 2017-12-18 ENCOUNTER — Inpatient Hospital Stay (HOSPITAL_COMMUNITY)
Admission: EM | Admit: 2017-12-18 | Discharge: 2017-12-25 | DRG: 074 | Disposition: A | Discharge status: Home / Self Care | Payer: 59 | Attending: Internal Medicine | Admitting: Internal Medicine

## 2017-12-18 ENCOUNTER — Encounter (HOSPITAL_COMMUNITY): Payer: Self-pay | Admitting: Emergency Medicine

## 2017-12-18 DIAGNOSIS — Z79899 Other long term (current) drug therapy: Secondary | ICD-10-CM

## 2017-12-18 DIAGNOSIS — R112 Nausea with vomiting, unspecified: Secondary | ICD-10-CM | POA: Diagnosis present

## 2017-12-18 DIAGNOSIS — E1043 Type 1 diabetes mellitus with diabetic autonomic (poly)neuropathy: Secondary | ICD-10-CM | POA: Diagnosis not present

## 2017-12-18 DIAGNOSIS — M797 Fibromyalgia: Secondary | ICD-10-CM | POA: Diagnosis present

## 2017-12-18 DIAGNOSIS — R948 Abnormal results of function studies of other organs and systems: Secondary | ICD-10-CM | POA: Diagnosis present

## 2017-12-18 DIAGNOSIS — I10 Essential (primary) hypertension: Secondary | ICD-10-CM | POA: Diagnosis present

## 2017-12-18 DIAGNOSIS — F419 Anxiety disorder, unspecified: Secondary | ICD-10-CM | POA: Diagnosis present

## 2017-12-18 DIAGNOSIS — R109 Unspecified abdominal pain: Secondary | ICD-10-CM

## 2017-12-18 DIAGNOSIS — R1110 Vomiting, unspecified: Secondary | ICD-10-CM

## 2017-12-18 DIAGNOSIS — R1010 Upper abdominal pain, unspecified: Secondary | ICD-10-CM

## 2017-12-18 DIAGNOSIS — I160 Hypertensive urgency: Secondary | ICD-10-CM | POA: Diagnosis not present

## 2017-12-18 DIAGNOSIS — G43A1 Cyclical vomiting, intractable: Secondary | ICD-10-CM | POA: Diagnosis present

## 2017-12-18 DIAGNOSIS — K529 Noninfective gastroenteritis and colitis, unspecified: Secondary | ICD-10-CM | POA: Diagnosis present

## 2017-12-18 DIAGNOSIS — E739 Lactose intolerance, unspecified: Secondary | ICD-10-CM | POA: Diagnosis present

## 2017-12-18 DIAGNOSIS — E876 Hypokalemia: Secondary | ICD-10-CM | POA: Diagnosis present

## 2017-12-18 DIAGNOSIS — F329 Major depressive disorder, single episode, unspecified: Secondary | ICD-10-CM | POA: Diagnosis present

## 2017-12-18 DIAGNOSIS — Z765 Malingerer [conscious simulation]: Secondary | ICD-10-CM

## 2017-12-18 DIAGNOSIS — R197 Diarrhea, unspecified: Secondary | ICD-10-CM | POA: Diagnosis not present

## 2017-12-18 DIAGNOSIS — T407X5A Adverse effect of cannabis (derivatives), initial encounter: Secondary | ICD-10-CM | POA: Diagnosis present

## 2017-12-18 DIAGNOSIS — Z23 Encounter for immunization: Secondary | ICD-10-CM

## 2017-12-18 DIAGNOSIS — R1084 Generalized abdominal pain: Secondary | ICD-10-CM | POA: Diagnosis not present

## 2017-12-18 DIAGNOSIS — R1013 Epigastric pain: Secondary | ICD-10-CM | POA: Diagnosis not present

## 2017-12-18 DIAGNOSIS — E108 Type 1 diabetes mellitus with unspecified complications: Secondary | ICD-10-CM

## 2017-12-18 DIAGNOSIS — D649 Anemia, unspecified: Secondary | ICD-10-CM | POA: Diagnosis present

## 2017-12-18 DIAGNOSIS — Z9049 Acquired absence of other specified parts of digestive tract: Secondary | ICD-10-CM

## 2017-12-18 DIAGNOSIS — Z91018 Allergy to other foods: Secondary | ICD-10-CM

## 2017-12-18 DIAGNOSIS — E109 Type 1 diabetes mellitus without complications: Secondary | ICD-10-CM | POA: Diagnosis present

## 2017-12-18 DIAGNOSIS — K3184 Gastroparesis: Secondary | ICD-10-CM | POA: Diagnosis present

## 2017-12-18 LAB — I-STAT BETA HCG BLOOD, ED (MC, WL, AP ONLY): I-stat hCG, quantitative: <5 m[IU]/mL (ref <5)

## 2017-12-18 LAB — CBC
HCT: 33.7 % — ABNORMAL LOW (ref 36.0–46.0)
Hemoglobin: 11.6 g/dL — ABNORMAL LOW (ref 12.0–15.0)
MCH: 28.9 pg (ref 26.0–34.0)
MCHC: 34.4 g/dL (ref 30.0–36.0)
MCV: 83.8 fL (ref 78.0–100.0)
Platelets: 259 10*3/uL (ref 150–400)
RBC: 4.02 MIL/uL (ref 3.87–5.11)
RDW: 12 % (ref 11.5–15.5)
WBC: 9 10*3/uL (ref 4.0–10.5)

## 2017-12-18 LAB — COMPREHENSIVE METABOLIC PANEL
ALT: 25 U/L (ref 0–44)
AST: 25 U/L (ref 15–41)
Albumin: 3.9 g/dL (ref 3.5–5.0)
Alkaline Phosphatase: 84 U/L (ref 38–126)
Anion gap: 14 (ref 5–15)
BUN: 21 mg/dL — ABNORMAL HIGH (ref 6–20)
CO2: 25 mmol/L (ref 22–32)
Calcium: 9.4 mg/dL (ref 8.9–10.3)
Chloride: 101 mmol/L (ref 98–111)
Creatinine, Ser: 0.59 mg/dL (ref 0.44–1.00)
GFR calc Af Amer: >60 mL/min (ref >60)
GFR calc non Af Amer: >60 mL/min (ref >60)
Glucose, Bld: 305 mg/dL — ABNORMAL HIGH (ref 70–99)
Potassium: 4 mmol/L (ref 3.5–5.1)
Sodium: 140 mmol/L (ref 135–145)
Total Bilirubin: 0.8 mg/dL (ref 0.3–1.2)
Total Protein: 8.2 g/dL — ABNORMAL HIGH (ref 6.5–8.1)

## 2017-12-18 LAB — URINALYSIS, ROUTINE W REFLEX MICROSCOPIC
Bacteria, UA: NONE SEEN
Bilirubin Urine: NEGATIVE
Glucose, UA: >=500 mg/dL — AB
Ketones, ur: 20 mg/dL — AB
Leukocytes, UA: NEGATIVE
Nitrite: NEGATIVE
Protein, ur: 100 mg/dL — AB
Specific Gravity, Urine: 1.024 (ref 1.005–1.030)
pH: 5 (ref 5.0–8.0)

## 2017-12-18 LAB — LIPASE, BLOOD: Lipase: 18 U/L (ref 11–51)

## 2017-12-18 IMAGING — CT CT ABD-PELV W/ CM
2 of 4 series · 15 of 46 positions shown, 17 images · IV contrast (iopamidol)
Comparison: CT [DATE], [DATE], [DATE], [DATE]

CLINICAL DATA: Generalized abdominal pain

EXAM:
CT ABDOMEN AND PELVIS WITH CONTRAST
TECHNIQUE: Multidetector CT imaging of the abdomen and pelvis was performed
using the standard protocol following bolus administration of
intravenous contrast.
CONTRAST:  100mL [GY] IOPAMIDOL ([GY]) INJECTION 61%

[Series 2: axial st · axial · 0.66mm/px · z∈[+983,+1383]mm · 12 of 92 slices shown, 14 images]
[im 6/92  soft-tissue]
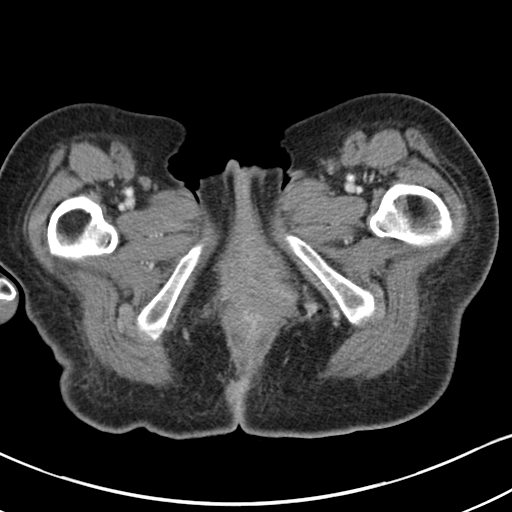
[im 6/92  bone]
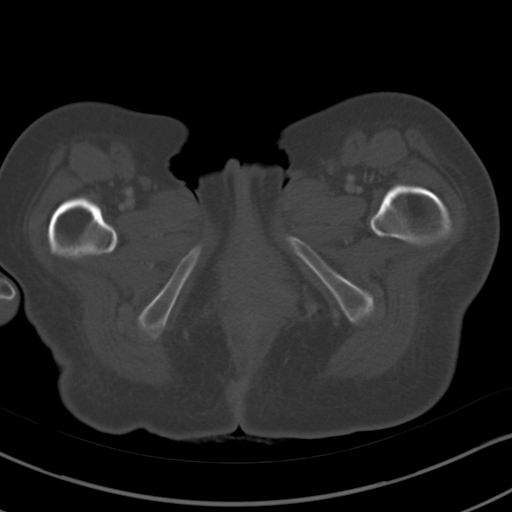
[im 16/92  soft-tissue]
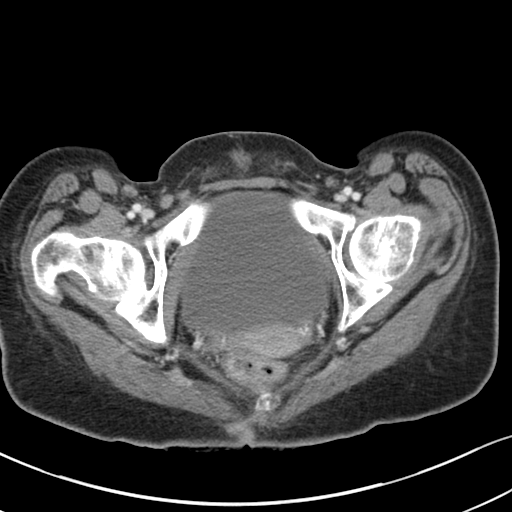
[im 21/92  soft-tissue]
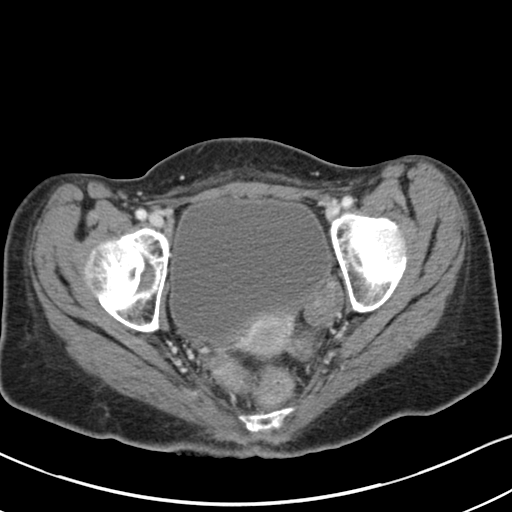
[im 26/92  soft-tissue]
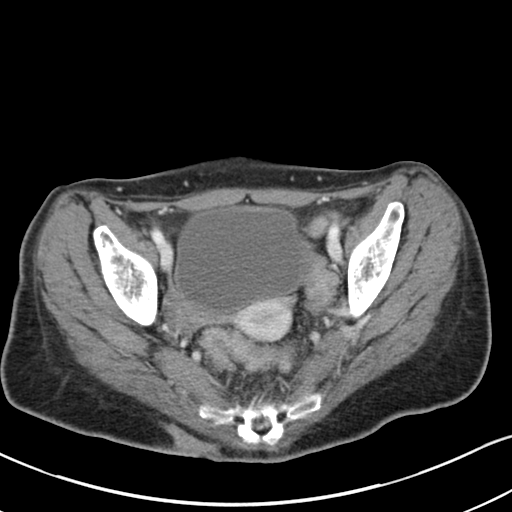
[im 36/92  soft-tissue]
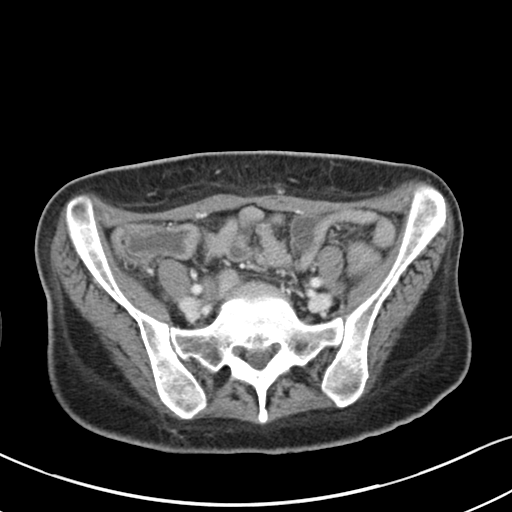
[im 41/92  soft-tissue]
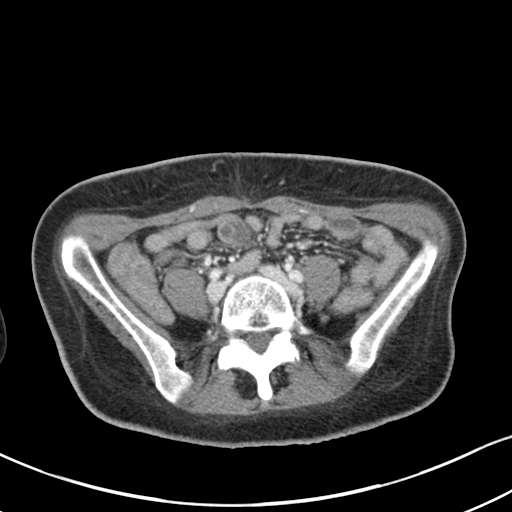
[im 51/92  soft-tissue]
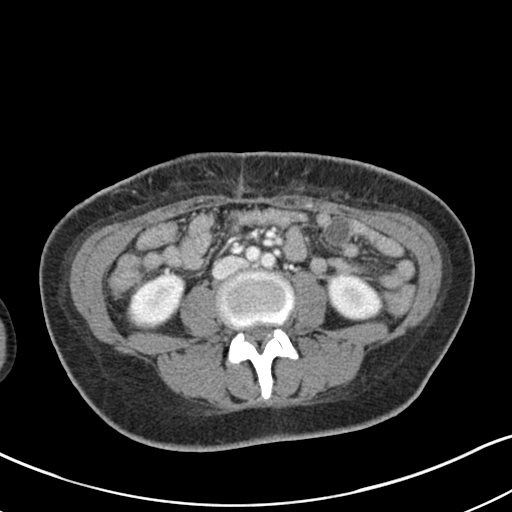
[im 56/92  soft-tissue]
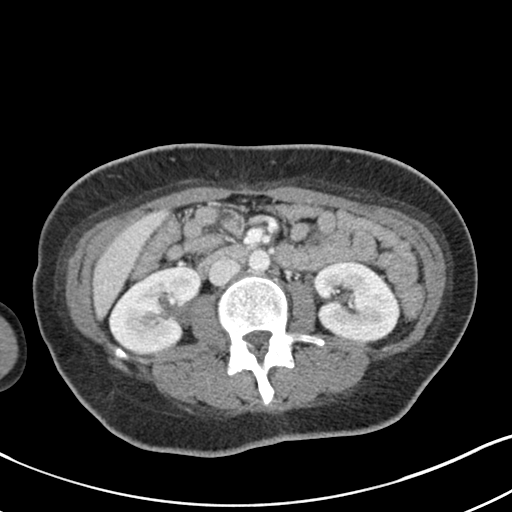
[im 66/92  soft-tissue]
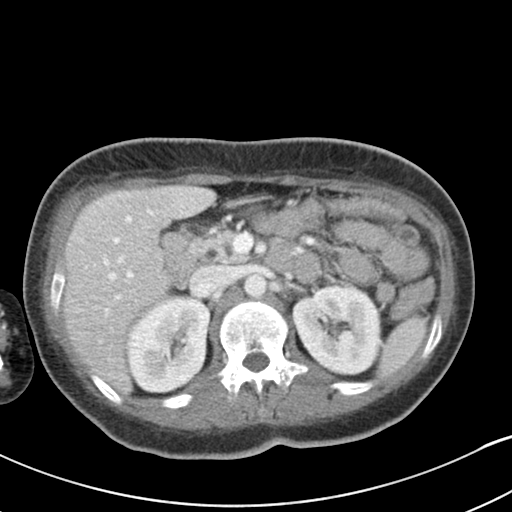
[im 66/92  bone]
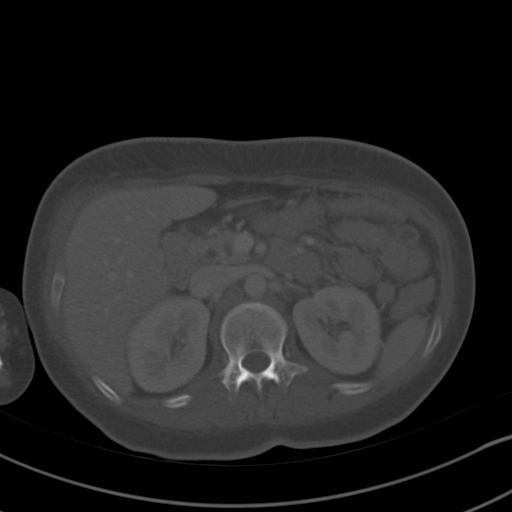
[im 71/92  soft-tissue]
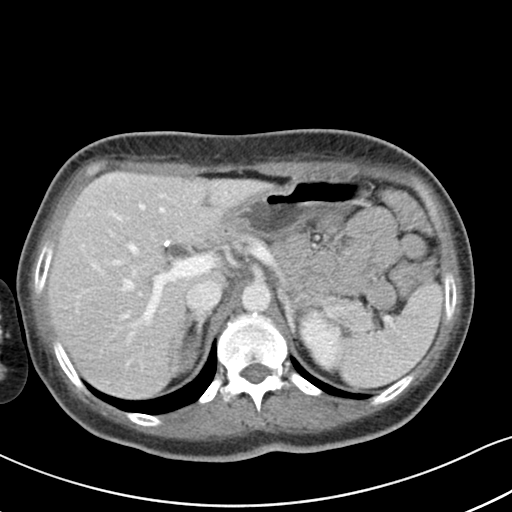
[im 76/92  soft-tissue]
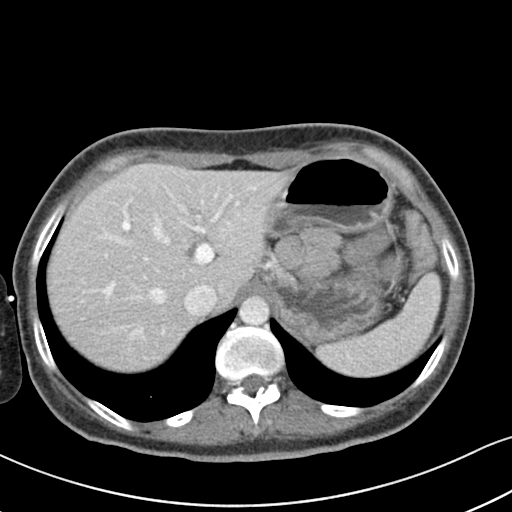
[im 86/92  soft-tissue]
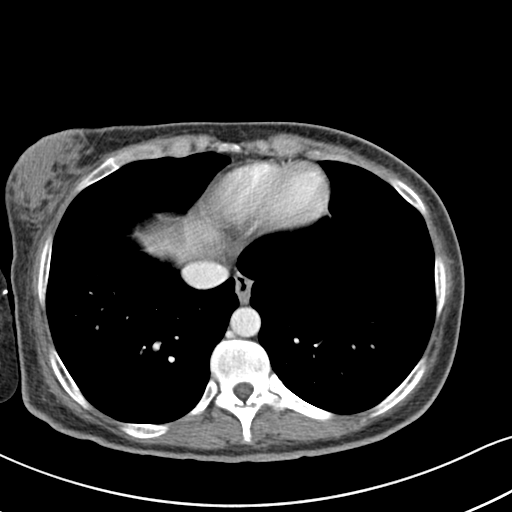

[Series 5: coronal st · coronal · 0.69mm/px · 3 of 78 slices shown]
[im 26/78  soft-tissue]
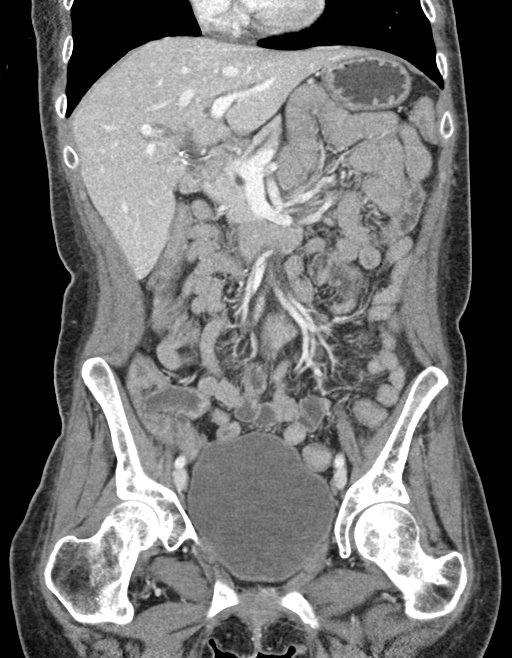
[im 35/78  soft-tissue]
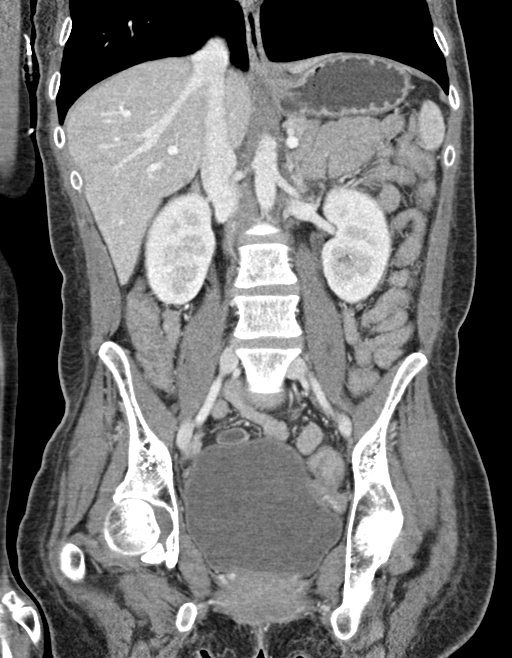
[im 43/78  soft-tissue]
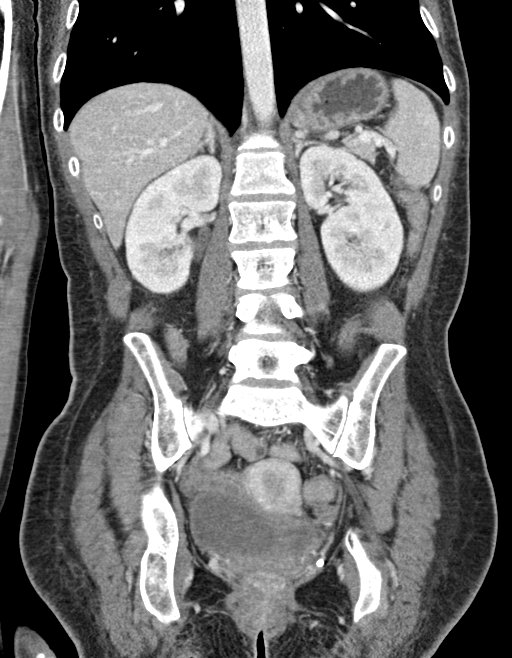

[15 of 46 positions shown; findings below may reference images not displayed]

FINDINGS: Lower chest: Lung bases demonstrate no acute consolidation or
pleural effusion. The heart size is within normal limits.

Hepatobiliary: No focal liver abnormality is seen. Status post
cholecystectomy. No biliary dilatation.

Pancreas: Unremarkable. No pancreatic ductal dilatation or
surrounding inflammatory changes. Pancreas is atrophic in
appearance.

Spleen: Normal in size without focal abnormality.

Adrenals/Urinary Tract: Adrenal glands are unremarkable. Kidneys are
normal, without renal calculi, focal lesion, or hydronephrosis.
Bladder is unremarkable.

Stomach/Bowel: Stomach is nonenlarged. No dilated small bowel. There
is diffuse under distension of the entire colon with slightly dense
appearance of the opposed mucosa. No definitive inflammatory change
in the fat surrounding the colon. Negative appendix. Perirectal soft
tissue thickening with slight asymmetric skin thickening at the
right gluteal fold. This is contiguous with linear density that
extends to the right gluteal region where there is bandlike soft
tissue thickening at the site of prior abscess. No fluid distension
or gas in the region.

Vascular/Lymphatic: Nonaneurysmal aorta. No significantly enlarged
lymph nodes

Reproductive: Uterus and bilateral adnexa are unremarkable.

Other: Negative for free air or free fluid.

Musculoskeletal: No acute or significant osseous findings.
IMPRESSION: 1. Slightly thickened appearance of the colon diffusely but without
surrounding inflammatory changes in the colon fat, findings could be
secondary to diffuse under distension of the colon versus a mild
colitis. There is perianal soft tissue thickening.
2. Residual bandlike soft tissue density in the right gluteal
region, likely representing scarring from prior abscess in the
region. There is possible possible anal cutaneous fistula as
evidenced by slight asymmetric thickening of the right gluteal fold
and linear density extending from the right posterior anal region
towards the soft tissue thickening at the right gluteal region.
There is no evidence for fluid collection or abscess in the soft
tissues.

## 2017-12-18 MED ORDER — SODIUM CHLORIDE 0.9 % IV BOLUS
1000.0000 mL | Freq: Once | INTRAVENOUS | Status: AC
  Administered 2017-12-18: 1000 mL via INTRAVENOUS

## 2017-12-18 MED ORDER — ONDANSETRON HCL 4 MG/2ML IJ SOLN
4.0000 mg | Freq: Once | INTRAMUSCULAR | Status: AC
  Administered 2017-12-18: 4 mg via INTRAVENOUS
  Filled 2017-12-18: qty 2

## 2017-12-18 MED ORDER — LORAZEPAM 2 MG/ML IJ SOLN
1.0000 mg | Freq: Once | INTRAMUSCULAR | Status: AC
  Administered 2017-12-18: 1 mg via INTRAVENOUS
  Filled 2017-12-18: qty 1

## 2017-12-18 MED ORDER — PANTOPRAZOLE SODIUM 40 MG PO TBEC: 40.0000 mg | DELAYED_RELEASE_TABLET | Freq: Every day | ORAL | 1 refills | Status: DC

## 2017-12-18 MED ORDER — MORPHINE SULFATE (PF) 4 MG/ML IV SOLN
4.0000 mg | Freq: Once | INTRAVENOUS | Status: AC
  Administered 2017-12-18: 4 mg via INTRAVENOUS
  Filled 2017-12-18: qty 1

## 2017-12-18 MED ORDER — SODIUM CHLORIDE 0.9 % IJ SOLN
INTRAMUSCULAR | Status: AC
  Filled 2017-12-18: qty 50

## 2017-12-18 MED ORDER — IOPAMIDOL (ISOVUE-300) INJECTION 61%
INTRAVENOUS | Status: AC
  Filled 2017-12-18: qty 100

## 2017-12-18 MED ORDER — IOPAMIDOL (ISOVUE-300) INJECTION 61%
100.0000 mL | Freq: Once | INTRAVENOUS | Status: AC | PRN
  Administered 2017-12-18: 100 mL via INTRAVENOUS

## 2017-12-18 NOTE — ED Notes (Signed)
Pt aware that urine sample is needed.  

## 2017-12-18 NOTE — ED Triage Notes (Signed)
Per pt, states back pain, N/V for a couple of days-states she doesn't have a primary MD and doesn't have a GI appointment for weeks-

## 2017-12-18 NOTE — ED Provider Notes (Signed)
[facility]-EMERGENCY DEPT Provider Note   CSN: [id number] Arrival date & time: 12/18/17  1619     History   Chief Complaint Chief Complaint  Patient presents with  . N/V  . Back Pain    HPI [name] is a 27 y.o. female.  HPI Patient was seen yesterday evening for similar complaints.  Complaining of upper abdominal pain, vomiting and diarrhea for the past 2 days.  No blood in the vomit or stool.  No fever or chills.  States she is been taking nausea medication at home with little relief.  She is also complaining of bilateral flank pain.  Denies urinary symptoms. Past Medical History:  Diagnosis Date  . Allergy   . Anemia   . Anxiety   . Blood transfusion without reported diagnosis    Dec 2018  . Cataract    right eye  . Depression   . Diabetes type 1, uncontrolled (HCC) 11/14/2011   Since age 75  . Fibromyalgia   . Gastroparesis   . GERD (gastroesophageal reflux disease)   . Hypertension   . Infection    UTI April 2016    Patient Active Problem List   Diagnosis Date Noted  . DKA (diabetic ketoacidoses) (HCC) 07/27/2017  . Normocytic anemia 07/10/2017  . SIRS (systemic inflammatory response syndrome) (HCC) 06/23/2017  . Hypokalemia 06/23/2017  . Normocytic normochromic anemia 06/23/2017  . GERD (gastroesophageal reflux disease) 06/23/2017  . Intractable nausea and vomiting 04/11/2017  . Nausea and vomiting 04/10/2017  . Essential hypertension   . Colitis   . Abscess and cellulitis of gluteal region 03/24/2017  . Cellulitis and abscess of buttock 03/23/2017  . Malnutrition of moderate degree 03/19/2017  . Anasarca 03/16/2017  . 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 Diarrhea 02/27/2017  . Lower GI bleed 02/27/2017  . DKA (diabetic ketoacidosis) (HCC) 02/26/2017  . Acute metabolic encephalopathy   . DKA, type 1 (HCC) 06/11/2013  . Orthostatic hypotension dysautonomic syndrome (HCC) 04/28/2013    . Body aches 04/28/2013  . Orthostatic hypotension 04/28/2013  . Diabetes mellitus type 1 (HCC) 04/28/2013  . Protein-calorie malnutrition, severe (HCC) 04/28/2013  . Noncompliance with diabetes treatment 09/01/2012  . Dyspnea 02/28/2012  . Sinus tachycardia 02/28/2012  . Hyperglycemia without ketosis 02/28/2012  . Chest pain 12/04/2011  . Insulin dependent diabetes mellitus (HCC) 11/14/2011  . Hypertension associated with diabetes (HCC) 11/14/2011  . Gastroparesis 11/14/2011  . Hyponatremia 09/19/2011  . Chronic abdominal pain 09/18/2011  . Metabolic alkalosis 09/18/2011  . Diabetic hyperosmolar non-ketotic state (HCC) 09/18/2011  . DIAB W/UNSPEC COMP TYPE I [JUV TYPE] UNCNTRL 06/04/2010  . CONSTIPATION 01/01/2010  . RECTAL PAIN 01/01/2010    Past Surgical History:  Procedure Laterality Date  . ANKLE SURGERY    . CHOLECYSTECTOMY  11/15/2011   Procedure: LAPAROSCOPIC CHOLECYSTECTOMY WITH INTRAOPERATIVE CHOLANGIOGRAM;  Surgeon: [name], MD;  Location: [facility];  Service: General;  Laterality: N/A;  . COLONOSCOPY    . COLONOSCOPY WITH PROPOFOL N/A 06/27/2017   Procedure: COLONOSCOPY WITH PROPOFOL;  Surgeon: Rachael Fee, MD;  Location: [facility];  Service: Endoscopy;  Laterality: N/A;  . ESOPHAGOGASTRODUODENOSCOPY  12/03/2011   Procedure: ESOPHAGOGASTRODUODENOSCOPY (EGD);  Surgeon: Theda Belfast, MD;  Location: [facility];  Service: Endoscopy;  Laterality: N/A;  . FLEXIBLE SIGMOIDOSCOPY N/A 03/10/2017   Procedure: FLEXIBLE SIGMOIDOSCOPY;  Surgeon: Jeani Hawking, MD;  Location: [facility];  Service: Endoscopy;  Laterality: N/A;  . INCISION AND DRAINAGE  PERIRECTAL ABSCESS N/A 03/01/2017   Procedure: IRRIGATION AND DEBRIDEMENT PERIRECTAL ABSCESS;  Surgeon: Ovidio Kin, MD;  Location: [facility];  Service: General;  Laterality: N/A;  . IRRIGATION AND DEBRIDEMENT BUTTOCKS N/A 03/23/2017   Procedure: IRRIGATION AND DEBRIDEMENT BUTTOCKS, SETON PLACEMENT;  Surgeon: Romie Levee, MD;  Location: [facility];  Service: General;  Laterality: N/A;  . LAPAROSCOPY  11/23/2011   Procedure: LAPAROSCOPY DIAGNOSTIC;  Surgeon: Mariella Saa, MD;  Location: [facility];  Service: General;  Laterality: N/A;  . SIGMOIDOSCOPY    . UPPER GASTROINTESTINAL ENDOSCOPY    . WISDOM TOOTH EXTRACTION       OB History    Gravida  1   Para  1   Term      Preterm  1   AB      Living  1     SAB      TAB      Ectopic      Multiple  0   Live Births  1            Home Medications    Prior to Admission medications   Medication Sig Start Date End Date Taking? Authorizing Provider  cholestyramine light (PREVALITE) 4 g packet Take 1 packet (4 g total) by mouth 3 (three) times daily. 03/14/17  Yes Rolly Salter, MD  citalopram (CELEXA) 20 MG tablet Take 1 tablet (20 mg total) by mouth daily. 07/13/17  Yes Emokpae, Courage, MD  diclofenac sodium (VOLTAREN) 1 % GEL Apply 2 g topically 4 (four) times daily. 08/01/17  Yes Mikhail, [name], DO  dicyclomine (BENTYL) 20 MG tablet Take 1 tablet (20 mg total) by mouth 2 (two) times daily. 12/17/17  Yes Maczis, Elmer Sow, PA-C  doxycycline (VIBRAMYCIN) 100 MG capsule Take 1 capsule (100 mg total) by mouth 2 (two) times daily. 11/26/17  Yes Petrucelli, Samantha R, PA-C  ferrous sulfate 325 (65 FE) MG tablet Take 1 tablet (325 mg total) by mouth 2 (two) times daily with a meal. 05/13/17  Yes Henson, Vickie L, NP-C  insulin lispro (HUMALOG) 100 UNIT/ML injection Inject 0.02 mLs (2 Units total) into the skin 3 (three) times daily with meals. Patient taking differently: Inject 0-9 Units into the skin 3 (three) times daily with meals. Sliding scale 04/09/17  Yes Romero Belling, MD  LANTUS 100 UNIT/ML injection Inject 0.12 mLs (12 Units total) into the skin daily. 07/13/17  Yes Emokpae, Courage, MD  mirtazapine (REMERON) 15 MG tablet Take 1 tablet (15 mg total) by mouth at bedtime. 10/20/17  Yes Armbruster, Willaim Rayas, MD  pantoprazole (PROTONIX) 40  MG tablet Take 1 tablet (40 mg total) by mouth daily. 12/18/17  Yes Armbruster, Willaim Rayas, MD  promethazine (PHENERGAN) 12.5 MG tablet Take 1 tablet (12.5 mg total) by mouth every 8 (eight) hours as needed for nausea or vomiting. 10/02/17  Yes Armbruster, Willaim Rayas, MD  sucralfate (CARAFATE) 1 g tablet Take 1 tablet (1 g total) by mouth 4 (four) times daily -  with meals and at bedtime. 09/17/17  Yes Mancel Bale, MD  acetaminophen (TYLENOL) 500 MG tablet Take 1,000 mg by mouth daily as needed for moderate pain.     [provider]  famotidine (PEPCID) 20 MG tablet Take 1 tablet (20 mg total) by mouth 2 (two) times daily. Patient not taking: Reported on 10/05/2017 09/02/17   Jacalyn Lefevre, MD  lipase/protease/amylase (CREON) 36000 UNITS CPEP capsule Take 1 capsule (36,000 Units total) by mouth 3 (three) times  daily with meals. Patient not taking: Reported on 10/05/2017 03/14/17   Rolly Salter, MD  metoCLOPramide (REGLAN) 10 MG tablet Take 1 tablet (10 mg total) by mouth every 6 (six) hours as needed for nausea or vomiting. 09/02/17   Jacalyn Lefevre, MD  ondansetron (ZOFRAN ODT) 4 MG disintegrating tablet Take 1 tablet (4 mg total) by mouth every 8 (eight) hours as needed for nausea or vomiting. 10/02/17   Armbruster, Willaim Rayas, MD  ondansetron (ZOFRAN) 8 MG tablet Take 1 tablet (8 mg total) by mouth every 8 (eight) hours as needed for nausea or vomiting. 09/17/17   Mancel Bale, MD    Family History Family History  Problem Relation Age of Onset  . Diabetes Mother   . Hypertension Father   . Colon cancer Paternal Grandmother        pt thinks PGM was dx in her 72's  . Esophageal cancer Neg Hx   . Liver cancer Neg Hx   . Pancreatic cancer Neg Hx   . Stomach cancer Neg Hx   . Rectal cancer Neg Hx     Social History Social History   Tobacco Use  . Smoking status: Never Smoker  . Smokeless tobacco: Never Used  Substance Use Topics  . Alcohol use: No    Alcohol/week: 0.0 standard drinks      Frequency: Never  . Drug use: No     Allergies   Other and Lactose intolerance (gi)   Review of Systems Review of Systems  Constitutional: Negative for chills and fever.  HENT: Negative for sore throat and trouble swallowing.   Respiratory: Negative for cough and shortness of breath.   Cardiovascular: Negative for chest pain.  Gastrointestinal: Positive for abdominal pain, diarrhea, nausea and vomiting. Negative for constipation.  Genitourinary: Positive for flank pain. Negative for dysuria and frequency.  Musculoskeletal: Positive for back pain and myalgias. Negative for neck pain.  Skin: Negative for rash and wound.  Neurological: Negative for dizziness, light-headedness, numbness and headaches.  All other systems reviewed and are negative.    Physical Exam Updated Vital Signs BP (!) 174/116   Pulse (!) 121   Temp 98.2 F (36.8 C)   Resp 20   Ht 5\' 7"  (1.702 m)   Wt 59 kg   SpO2 100%   BMI 20.36 kg/m   Physical Exam  Constitutional: She is oriented to person, place, and time. She appears well-developed and well-nourished. She appears distressed.  Patient leaning over the sink.  HENT:  Head: Normocephalic and atraumatic.  Mouth/Throat: Oropharynx is clear and moist. No oropharyngeal exudate.  Eyes: Pupils are equal, round, and reactive to light. EOM are normal.  Neck: Normal range of motion. Neck supple. No JVD present.  Cardiovascular: Regular rhythm. Exam reveals no gallop and no friction rub.  No murmur heard. Tachycardia.  Pulmonary/Chest: Effort normal and breath sounds normal. No respiratory distress. She has no wheezes. She has no rales.  Abdominal: Soft. Bowel sounds are normal. She exhibits no distension and no mass. There is tenderness. There is no rebound and no guarding. No hernia.  Epigastric tenderness to palpation.  No rebound or guarding.  Musculoskeletal: Normal range of motion. She exhibits no edema or tenderness.  Mild bilateral CVA  tenderness.  No midline thoracic or lumbar tenderness.  No lower extremity swelling, asymmetry or tenderness.  Lymphadenopathy:    She has no cervical adenopathy.  Neurological: She is alert and oriented to person, place, and time.  Moving all extremities  without focal deficit.  Sensation fully intact.  Skin: Skin is warm and dry. Capillary refill takes less than 2 seconds. No rash noted. No erythema.  Psychiatric: She has a normal mood and affect. Her behavior is normal.  Nursing note and vitals reviewed.    ED Treatments / Results  Labs (all labs ordered are listed, but only abnormal results are displayed) Labs Reviewed  COMPREHENSIVE METABOLIC PANEL - Abnormal; Notable for the following components:      Result Value   Glucose, Bld 305 (*)    BUN 21 (*)    Total Protein 8.2 (*)    All other components within normal limits  CBC - Abnormal; Notable for the following components:   Hemoglobin 11.6 (*)    HCT 33.7 (*)    All other components within normal limits  URINALYSIS, ROUTINE W REFLEX MICROSCOPIC - Abnormal; Notable for the following components:   Glucose, UA >=500 (*)    Hgb urine dipstick MODERATE (*)    Ketones, ur 20 (*)    Protein, ur 100 (*)    All other components within normal limits  LIPASE, BLOOD  I-STAT BETA HCG BLOOD, ED ([facility], [facility], [facility] ONLY)    EKG None  Radiology Ct Abdomen Pelvis W Contrast  Result Date: 12/18/2017 CLINICAL DATA:  Generalized abdominal pain EXAM: CT ABDOMEN AND PELVIS WITH CONTRAST TECHNIQUE: Multidetector CT imaging of the abdomen and pelvis was performed using the standard protocol following bolus administration of intravenous contrast. CONTRAST:  ISOVUE-300 IOPAMIDOL (ISOVUE-300) INJECTION 61% COMPARISON:  CT 06/19/2017, 05/27/2017, 04/10/2017, 03/23/2017 FINDINGS: Lower chest: Lung bases demonstrate no acute consolidation or pleural effusion. The heart size is within normal limits. Hepatobiliary: No focal liver abnormality is seen.  Status post cholecystectomy. No biliary dilatation. Pancreas: Unremarkable. No pancreatic ductal dilatation or surrounding inflammatory changes. Pancreas is atrophic in appearance. Spleen: Normal in size without focal abnormality. Adrenals/Urinary Tract: Adrenal glands are unremarkable. Kidneys are normal, without renal calculi, focal lesion, or hydronephrosis. Bladder is unremarkable. Stomach/Bowel: Stomach is nonenlarged. No dilated small bowel. There is diffuse under distension of the entire colon with slightly dense appearance of the opposed mucosa. No definitive inflammatory change in the fat surrounding the colon. Negative appendix. Perirectal soft tissue thickening with slight asymmetric skin thickening at the right gluteal fold. This is contiguous with linear density that extends to the right gluteal region where there is bandlike soft tissue thickening at the site of prior abscess. No fluid distension or gas in the region. Vascular/Lymphatic: Nonaneurysmal aorta. No significantly enlarged lymph nodes Reproductive: Uterus and bilateral adnexa are unremarkable. Other: Negative for free air or free fluid. Musculoskeletal: No acute or significant osseous findings. IMPRESSION: 1. Slightly thickened appearance of the colon diffusely but without surrounding inflammatory changes in the colon fat, findings could be secondary to diffuse under distension of the colon versus a mild colitis. There is perianal soft tissue thickening. 2. Residual bandlike soft tissue density in the right gluteal region, likely representing scarring from prior abscess in the region. 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 There is no evidence for fluid collection or abscess in the soft tissues. Electronically Signed   By: Jasmine Pang M.D.   On: 12/18/2017 21:44    Procedures Procedures (including critical care time)  Medications Ordered in ED Medications  sodium chloride 0.9 % injection (has no administration in time range)  sodium chloride 0.9 % bolus 1,000 mL (0 mLs Intravenous Stopped 12/18/17 2312)  LORazepam (ATIVAN) injection 1 mg (1 mg Intravenous Given 12/18/17 2045)  morphine 4 MG/ML injection 4 mg (4 mg Intravenous Given 12/18/17 2043)  ondansetron (ZOFRAN) injection 4 mg (4 mg Intravenous Given 12/18/17 2041)  iopamidol (ISOVUE-300) 61 % injection 100 mL (100 mLs Intravenous Contrast Given 12/18/17 2118)  LORazepam (ATIVAN) injection 1 mg (1 mg Intravenous Given 12/18/17 2307)  morphine 4 MG/ML injection 4 mg (4 mg Intravenous Given 12/18/17 2311)     Initial Impression / Assessment and Plan / ED Course  I have reviewed the triage vital signs and the nursing notes.  Pertinent labs & imaging results that were available during my care of the patient were reviewed by me and considered in my medical decision making (see chart for details).     Patient with persistent nausea and pain despite multiple doses of medication.  CT abdomen with diffuse colonic wall thickening consistent with Crohn's disease.  Hospitalist to admit.  Final Clinical Impressions(s) / ED Diagnoses   Final diagnoses:  Vomiting and diarrhea  Pain of upper abdomen    ED Discharge Orders    None       [name], MD 12/19/17 0010

## 2017-12-19 ENCOUNTER — Observation Stay (HOSPITAL_COMMUNITY): Payer: 59

## 2017-12-19 DIAGNOSIS — R1010 Upper abdominal pain, unspecified: Secondary | ICD-10-CM | POA: Diagnosis not present

## 2017-12-19 DIAGNOSIS — E1043 Type 1 diabetes mellitus with diabetic autonomic (poly)neuropathy: Secondary | ICD-10-CM | POA: Diagnosis not present

## 2017-12-19 DIAGNOSIS — R112 Nausea with vomiting, unspecified: Secondary | ICD-10-CM | POA: Diagnosis not present

## 2017-12-19 DIAGNOSIS — K3184 Gastroparesis: Secondary | ICD-10-CM | POA: Diagnosis not present

## 2017-12-19 LAB — BASIC METABOLIC PANEL
Anion gap: 18 — ABNORMAL HIGH (ref 5–15)
BUN: 19 mg/dL (ref 6–20)
CO2: 22 mmol/L (ref 22–32)
Calcium: 9.3 mg/dL (ref 8.9–10.3)
Chloride: 102 mmol/L (ref 98–111)
Creatinine, Ser: 0.65 mg/dL (ref 0.44–1.00)
GFR calc Af Amer: >60 mL/min (ref >60)
GFR calc non Af Amer: >60 mL/min (ref >60)
Glucose, Bld: 272 mg/dL — ABNORMAL HIGH (ref 70–99)
Potassium: 4.1 mmol/L (ref 3.5–5.1)
Sodium: 142 mmol/L (ref 135–145)

## 2017-12-19 LAB — GLUCOSE, CAPILLARY
Glucose-Capillary: 254 mg/dL — ABNORMAL HIGH (ref 70–99)
Glucose-Capillary: 242 mg/dL — ABNORMAL HIGH (ref 70–99)
Glucose-Capillary: 118 mg/dL — ABNORMAL HIGH (ref 70–99)
Glucose-Capillary: 197 mg/dL — ABNORMAL HIGH (ref 70–99)
Glucose-Capillary: 192 mg/dL — ABNORMAL HIGH (ref 70–99)

## 2017-12-19 LAB — CBC
HCT: 35.6 % — ABNORMAL LOW (ref 36.0–46.0)
Hemoglobin: 12.4 g/dL (ref 12.0–15.0)
MCH: 29 pg (ref 26.0–34.0)
MCHC: 34.8 g/dL (ref 30.0–36.0)
MCV: 83.2 fL (ref 78.0–100.0)
Platelets: 253 10*3/uL (ref 150–400)
RBC: 4.28 MIL/uL (ref 3.87–5.11)
RDW: 12.2 % (ref 11.5–15.5)
WBC: 7.2 10*3/uL (ref 4.0–10.5)

## 2017-12-19 IMAGING — DX DG ABDOMEN ACUTE W/ 1V CHEST
3 series · 3 of 3 positions shown · non-contrast
Comparison: CT scan from yesterday.

CLINICAL DATA: Upper abdominal pain with vomiting and diarrhea.

EXAM:
DG ABDOMEN ACUTE W/ 1V CHEST

[chest pa]
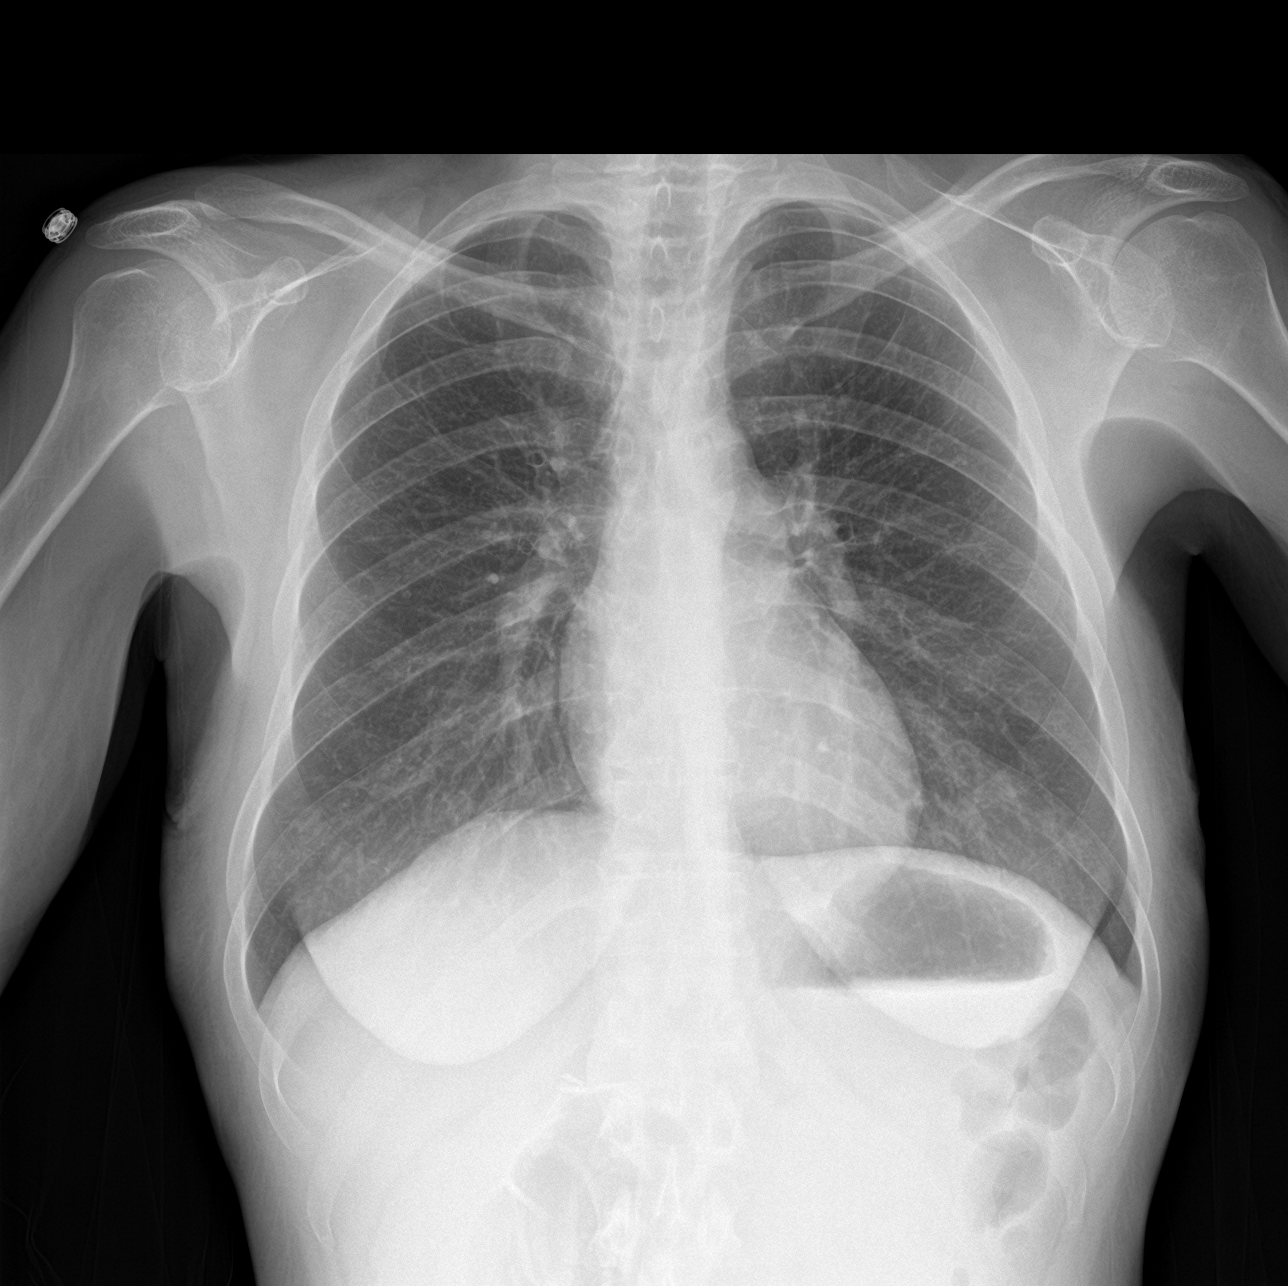

[abdomen erect]
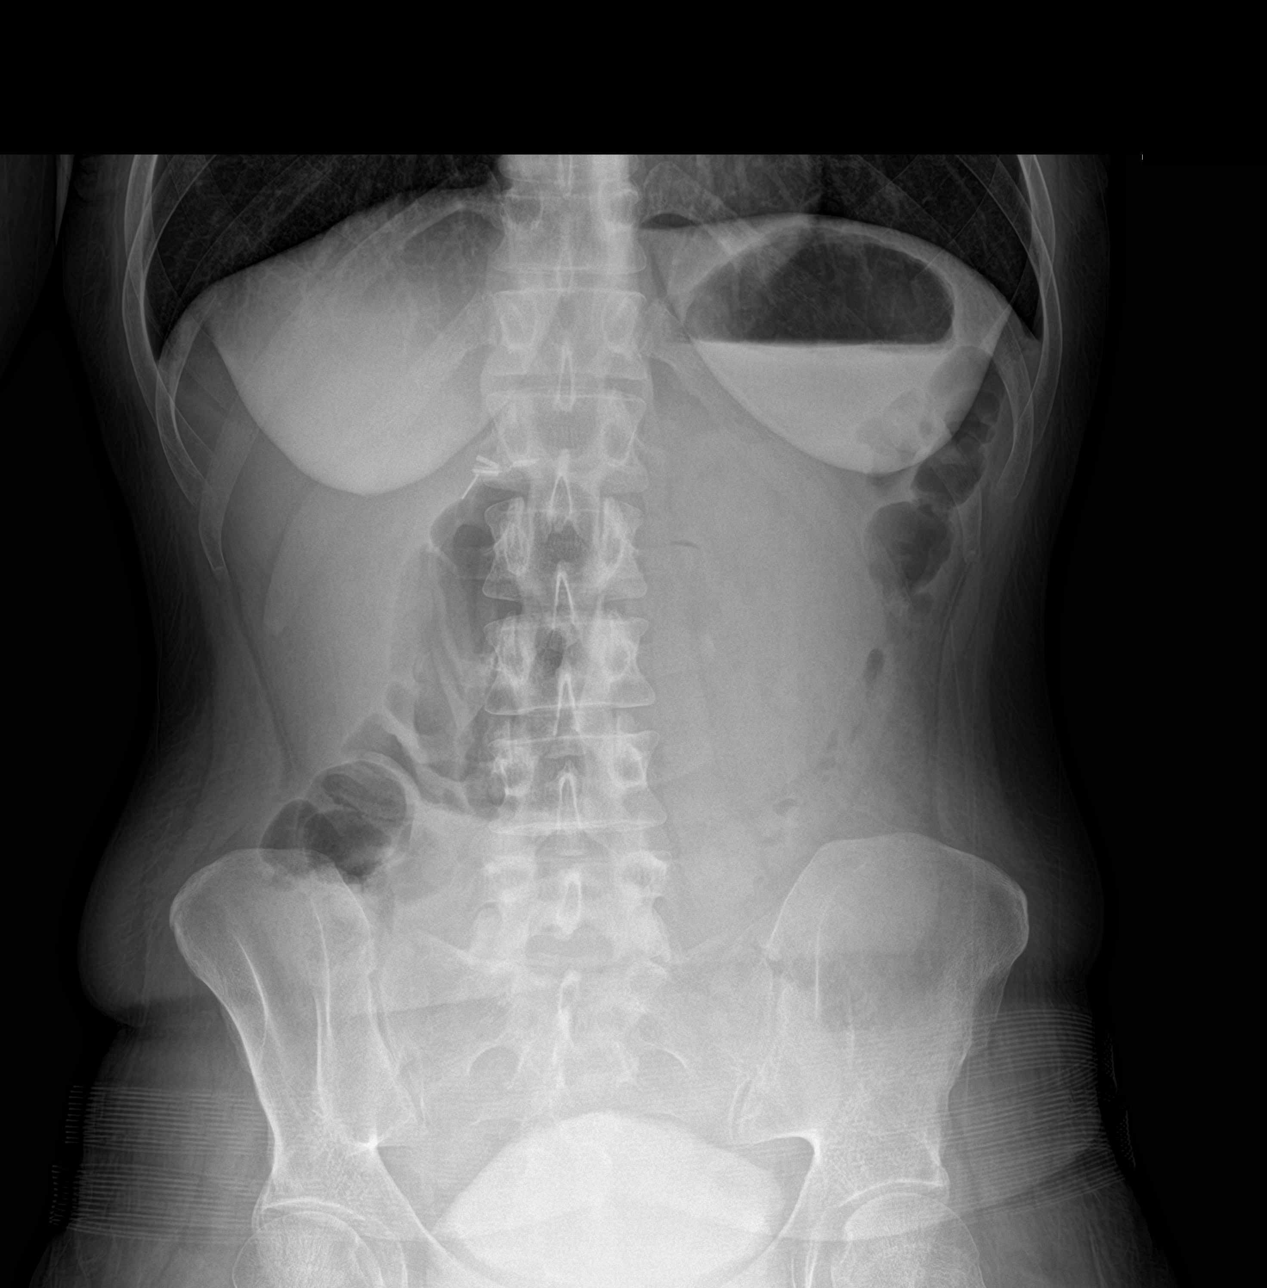

[abdomen supine]
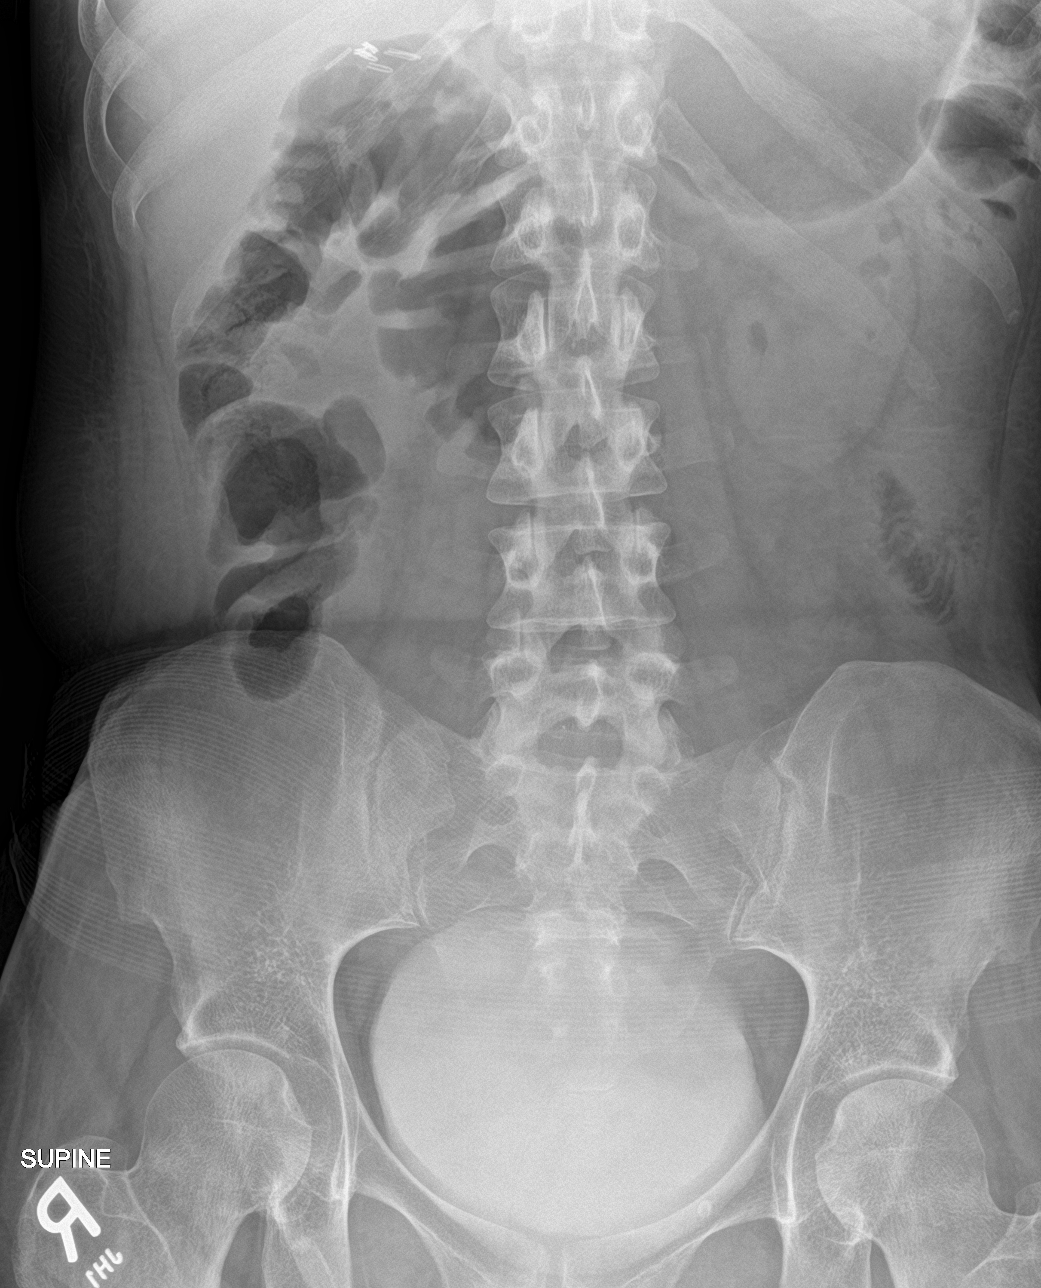

[3 of 3 positions shown; findings below may reference images not displayed]

FINDINGS: The lungs are clear without focal pneumonia, edema, pneumothorax or
pleural effusion. The cardiopericardial silhouette is within normal
limits for size. The visualized bony structures of the thorax are
intact.

Upright film shows no evidence for intraperitoneal free air. No
gaseous bowel dilatation to suggest obstruction. Moderate air-fluid
level noted in the stomach. Bladder is opacified with contrast,
consistent with CT scan yesterday.
IMPRESSION: 1. No acute cardiopulmonary process.
2. No evidence for bowel obstruction or perforation.

## 2017-12-19 MED ORDER — HYOSCYAMINE SULFATE 0.5 MG/ML IJ SOLN
0.5000 mg | Freq: Four times a day (QID) | INTRAMUSCULAR | Status: AC | PRN
  Administered 2017-12-22 (×2): 0.5 mg via INTRAVENOUS
  Filled 2017-12-19 (×4): qty 1

## 2017-12-19 MED ORDER — PANTOPRAZOLE SODIUM 40 MG PO TBEC
40.0000 mg | DELAYED_RELEASE_TABLET | Freq: Every day | ORAL | Status: DC
  Administered 2017-12-19 – 2017-12-21 (×2): 40 mg via ORAL
  Filled 2017-12-19 (×4): qty 1

## 2017-12-19 MED ORDER — MORPHINE SULFATE (PF) 2 MG/ML IV SOLN
2.0000 mg | INTRAVENOUS | Status: DC | PRN
  Administered 2017-12-19: 2 mg via INTRAVENOUS
  Filled 2017-12-19: qty 1

## 2017-12-19 MED ORDER — METOCLOPRAMIDE HCL 5 MG/ML IJ SOLN
10.0000 mg | Freq: Four times a day (QID) | INTRAMUSCULAR | Status: DC
  Administered 2017-12-19 – 2017-12-25 (×15): 10 mg via INTRAVENOUS
  Filled 2017-12-19 (×18): qty 2

## 2017-12-19 MED ORDER — INSULIN ASPART 100 UNIT/ML ~~LOC~~ SOLN
0.0000 [IU] | SUBCUTANEOUS | Status: DC
  Administered 2017-12-19: 3 [IU] via SUBCUTANEOUS
  Administered 2017-12-19: 5 [IU] via SUBCUTANEOUS
  Administered 2017-12-20: 3 [IU] via SUBCUTANEOUS
  Administered 2017-12-20 – 2017-12-21 (×3): 2 [IU] via SUBCUTANEOUS
  Administered 2017-12-21: 1 [IU] via SUBCUTANEOUS
  Administered 2017-12-24 – 2017-12-25 (×4): 2 [IU] via SUBCUTANEOUS
  Administered 2017-12-25: 1 [IU] via SUBCUTANEOUS

## 2017-12-19 MED ORDER — ONDANSETRON HCL 4 MG PO TABS: 4.0000 mg | ORAL_TABLET | Freq: Four times a day (QID) | ORAL | Status: DC | PRN

## 2017-12-19 MED ORDER — DICYCLOMINE HCL 20 MG PO TABS
20.0000 mg | ORAL_TABLET | Freq: Two times a day (BID) | ORAL | Status: DC
  Administered 2017-12-19 – 2017-12-25 (×7): 20 mg via ORAL
  Filled 2017-12-19 (×13): qty 1

## 2017-12-19 MED ORDER — MORPHINE SULFATE (PF) 4 MG/ML IV SOLN
4.0000 mg | INTRAVENOUS | Status: DC | PRN
  Administered 2017-12-19 – 2017-12-20 (×3): 4 mg via INTRAVENOUS
  Filled 2017-12-19 (×3): qty 1

## 2017-12-19 MED ORDER — ENOXAPARIN SODIUM 40 MG/0.4ML ~~LOC~~ SOLN
40.0000 mg | Freq: Every day | SUBCUTANEOUS | Status: DC
  Administered 2017-12-19 – 2017-12-25 (×6): 40 mg via SUBCUTANEOUS
  Filled 2017-12-19 (×7): qty 0.4

## 2017-12-19 MED ORDER — CIPROFLOXACIN IN D5W 400 MG/200ML IV SOLN
400.0000 mg | Freq: Two times a day (BID) | INTRAVENOUS | Status: DC
  Administered 2017-12-20 – 2017-12-24 (×8): 400 mg via INTRAVENOUS
  Filled 2017-12-19 (×8): qty 200

## 2017-12-19 MED ORDER — INSULIN GLARGINE 100 UNIT/ML ~~LOC~~ SOLN
10.0000 [IU] | Freq: Every day | SUBCUTANEOUS | Status: DC
  Administered 2017-12-19 – 2017-12-25 (×5): 10 [IU] via SUBCUTANEOUS
  Filled 2017-12-19 (×7): qty 0.1

## 2017-12-19 MED ORDER — KETOROLAC TROMETHAMINE 30 MG/ML IJ SOLN
30.0000 mg | Freq: Four times a day (QID) | INTRAMUSCULAR | Status: DC | PRN
  Administered 2017-12-19 – 2017-12-20 (×4): 30 mg via INTRAVENOUS
  Filled 2017-12-19 (×4): qty 1

## 2017-12-19 MED ORDER — ACETAMINOPHEN 650 MG RE SUPP: 650.0000 mg | Freq: Four times a day (QID) | RECTAL | Status: DC | PRN

## 2017-12-19 MED ORDER — METRONIDAZOLE IN NACL 5-0.79 MG/ML-% IV SOLN
500.0000 mg | Freq: Three times a day (TID) | INTRAVENOUS | Status: DC
  Administered 2017-12-20 – 2017-12-24 (×12): 500 mg via INTRAVENOUS
  Filled 2017-12-19 (×15): qty 100

## 2017-12-19 MED ORDER — DOXYCYCLINE HYCLATE 100 MG PO TABS
100.0000 mg | ORAL_TABLET | Freq: Two times a day (BID) | ORAL | Status: DC
  Administered 2017-12-19: 100 mg via ORAL
  Filled 2017-12-19 (×2): qty 1

## 2017-12-19 MED ORDER — PROMETHAZINE HCL 25 MG/ML IJ SOLN
12.5000 mg | Freq: Three times a day (TID) | INTRAMUSCULAR | Status: DC | PRN
  Administered 2017-12-19 (×2): 12.5 mg via INTRAVENOUS
  Filled 2017-12-19 (×3): qty 1

## 2017-12-19 MED ORDER — ONDANSETRON HCL 4 MG/2ML IJ SOLN
4.0000 mg | Freq: Four times a day (QID) | INTRAMUSCULAR | Status: DC | PRN
  Administered 2017-12-19 – 2017-12-24 (×8): 4 mg via INTRAVENOUS
  Filled 2017-12-19 (×8): qty 2

## 2017-12-19 MED ORDER — CITALOPRAM HYDROBROMIDE 10 MG PO TABS
20.0000 mg | ORAL_TABLET | Freq: Every day | ORAL | Status: DC
  Administered 2017-12-19 – 2017-12-25 (×3): 20 mg via ORAL
  Filled 2017-12-19 (×7): qty 2

## 2017-12-19 MED ORDER — SUCRALFATE 1 G PO TABS
1.0000 g | ORAL_TABLET | Freq: Three times a day (TID) | ORAL | Status: DC
  Administered 2017-12-19 – 2017-12-25 (×9): 1 g via ORAL
  Filled 2017-12-19 (×17): qty 1

## 2017-12-19 MED ORDER — ACETAMINOPHEN 325 MG PO TABS
650.0000 mg | ORAL_TABLET | Freq: Four times a day (QID) | ORAL | Status: DC | PRN
  Filled 2017-12-19: qty 2

## 2017-12-19 MED ORDER — SODIUM CHLORIDE 0.9 % IV SOLN
INTRAVENOUS | Status: DC
  Administered 2017-12-19 – 2017-12-23 (×8): via INTRAVENOUS

## 2017-12-19 MED ORDER — MIRTAZAPINE 15 MG PO TABS
15.0000 mg | ORAL_TABLET | Freq: Every day | ORAL | Status: DC
  Administered 2017-12-21 – 2017-12-24 (×5): 15 mg via ORAL
  Filled 2017-12-19: qty 0.5
  Filled 2017-12-19 (×6): qty 1

## 2017-12-19 MED ORDER — FERROUS SULFATE 325 (65 FE) MG PO TABS
325.0000 mg | ORAL_TABLET | Freq: Two times a day (BID) | ORAL | Status: DC
  Administered 2017-12-19 – 2017-12-25 (×4): 325 mg via ORAL
  Filled 2017-12-19 (×9): qty 1

## 2017-12-19 NOTE — Progress Notes (Signed)
Patient seen and examined at bedside, patient admitted after midnight, please see earlier detailed admission note by Etta Quill, DO. Briefly, patient presented with nausea, vomiting, abdominal pain. Colitis on CT scan. ?gastroparesis. Continue supportive care. Add morphine prn.   Cordelia Poche, MD [facility] 12/19/2017, 12:16 PM

## 2017-12-19 NOTE — Progress Notes (Signed)
ED TO INPATIENT HANDOFF REPORT  Name/Age/Gender [name] 27 y.o. female  Code Status    Code Status Orders  (From admission, onward)         Start     Ordered   12/19/17 0305  Full code  Continuous     12/19/17 0305        Code Status History    Date Active Date Inactive Code Status Order ID Comments User Context   07/27/2017 0340 08/01/2017 1716 Full Code [id number]  Rise Patience, MD ED   07/10/2017 0441 07/13/2017 1908 Full Code [id number]  Vianne Bulls, MD ED   06/23/2017 2017 06/27/2017 2144 Full Code [id number]  Norval Morton, MD ED   04/10/2017 1406 04/16/2017 1816 Full Code [id number]  Phillips Grout, MD Inpatient   03/23/2017 1316 03/24/2017 1854 Full Code [id number]  Phillips Grout, MD Inpatient   03/17/2017 0050 03/21/2017 2352 Full Code [id number]  Rise Patience, MD ED   02/26/2017 2259 03/14/2017 2221 Full Code [id number]  Karmen Bongo, MD Inpatient   02/26/2017 2012 02/26/2017 2259 Full Code [id number]  Aldean Jewett, MD ED   01/26/2015 0513 01/29/2015 1812 Full Code [id number]  Morene Crocker, CNM Inpatient   01/24/2015 1021 01/26/2015 0513 Full Code [id number]  Mcneil Sober, RN Inpatient   01/16/2015 1930 01/24/2015 1021 Full Code [id number]  Gwen Pounds, [place] Inpatient   06/11/2013 0049 06/12/2013 1340 Full Code [id number]  Theressa Millard, MD ED   04/28/2013 0319 04/29/2013 1814 Full Code [id number]  Rise Patience, MD Inpatient   09/01/2012 0749 09/02/2012 1159 Full Code [id number]  Derrill Kay, MD Inpatient   03/15/2012 2346 03/16/2012 1646 Full Code [id number]  Rueben Bash, RN Inpatient   02/28/2012 1657 03/01/2012 1921 Full Code [id number]  Tamsen Roers, RN Inpatient   11/23/2011 0336 12/06/2011 2004 Full Code [id number]  Aldean Ast, RN Inpatient   09/18/2011 1007 09/20/2011 2050 Full Code [id number]  Dionne Milo, NP Inpatient      Home/SNF/Other Home  Chief Complaint Nausea/Emesis Stomach Pain  Level of  Care/Admitting Diagnosis ED Disposition    ED Disposition Condition [facility] Area: [place] [[id number]]  Level of Care: Med-Surg [16]  Diagnosis: Intractable nausea and vomiting [546503]  Admitting Physician: Etta Quill [[id number]]  Attending Physician: Etta Quill [[id number]]  PT Class (Do Not Modify): Observation [104]  PT Acc Code (Do Not Modify): Observation [10022]       Medical History Past Medical History:  Diagnosis Date  . Allergy   . Anemia   . Anxiety   . Blood transfusion without reported diagnosis    Dec 2018  . Cataract    right eye  . Depression   . Diabetes type 1, uncontrolled ([place]) 11/14/2011   Since age 47  . Fibromyalgia   . Gastroparesis   . GERD (gastroesophageal reflux disease)   . Hypertension   . Infection    UTI April 2016    Allergies Allergies  Allergen Reactions  . Other Anaphylaxis    Reaction to [place] nuts   . Lactose Intolerance (Gi) Diarrhea    IV Location/Drains/Wounds Patient Lines/Drains/Airways Status   Active Line/Drains/Airways    Name:   Placement date:   Placement time:   Site:   Days:   Peripheral IV 12/18/17 Medial;Right;Upper Arm   12/18/17    2037    Arm   1  Labs/Imaging Results for orders placed or performed during the hospital encounter of 12/18/17 (from the past 48 hour(s))  Lipase, blood     Status: None   Collection Time: 12/18/17  4:36 PM  Result Value Ref Range   Lipase 18 11 - 51 U/L    Comment: Performed at [facility], [place] [address] [place], [place] [address]  Comprehensive metabolic panel     Status: Abnormal   Collection Time: 12/18/17  4:36 PM  Result Value Ref Range   Sodium 140 135 - 145 mmol/L   Potassium 4.0 3.5 - 5.1 mmol/L   Chloride 101 98 - 111 mmol/L   CO2 25 22 - 32 mmol/L   Glucose, Bld 305 (H) 70 - 99 mg/dL   BUN 21 (H) 6 - 20 mg/dL   Creatinine, Ser 0.59 0.44 - 1.00 mg/dL   Calcium 9.4 8.9 - 10.3 mg/dL    Total Protein 8.2 (H) 6.5 - 8.1 g/dL   Albumin 3.9 3.5 - 5.0 g/dL   AST 25 15 - 41 U/L   ALT 25 0 - 44 U/L   Alkaline Phosphatase 84 38 - 126 U/L   Total Bilirubin 0.8 0.3 - 1.2 mg/dL   GFR calc non Af Amer >60 >60 mL/min   GFR calc Af Amer >60 >60 mL/min    Comment: (NOTE) The eGFR has been calculated using the CKD EPI equation. This calculation has not been validated in all clinical situations. eGFR's persistently <60 mL/min signify possible Chronic Kidney Disease.    Anion gap 14 5 - 15    Comment: Performed at [facility], [place] [address] [place], [place] [address]  CBC     Status: Abnormal   Collection Time: 12/18/17  4:36 PM  Result Value Ref Range   WBC 9.0 4.0 - 10.5 K/uL   RBC 4.02 3.87 - 5.11 MIL/uL   Hemoglobin 11.6 (L) 12.0 - 15.0 g/dL   HCT 33.7 (L) 36.0 - 46.0 %   MCV 83.8 78.0 - 100.0 fL   MCH 28.9 26.0 - 34.0 pg   MCHC 34.4 30.0 - 36.0 g/dL   RDW 12.0 11.5 - 15.5 %   Platelets 259 150 - 400 K/uL    Comment: Performed at [facility], [place] [address], [place], [place] [address]  Urinalysis, Routine w reflex microscopic     Status: Abnormal   Collection Time: 12/18/17  4:36 PM  Result Value Ref Range   Color, Urine YELLOW YELLOW   APPearance CLEAR CLEAR   Specific Gravity, Urine 1.024 1.005 - 1.030   pH 5.0 5.0 - 8.0   Glucose, UA >=500 (A) NEGATIVE mg/dL   Hgb urine dipstick MODERATE (A) NEGATIVE   Bilirubin Urine NEGATIVE NEGATIVE   Ketones, ur 20 (A) NEGATIVE mg/dL   Protein, ur 100 (A) NEGATIVE mg/dL   Nitrite NEGATIVE NEGATIVE   Leukocytes, UA NEGATIVE NEGATIVE   RBC / HPF 11-20 0 - 5 RBC/hpf   WBC, UA 6-10 0 - 5 WBC/hpf   Bacteria, UA NONE SEEN NONE SEEN   Squamous Epithelial / LPF 0-5 0 - 5   Mucus PRESENT    Hyaline Casts, UA PRESENT     Comment: Performed at [facility], [place] [address], [place],  [address]  I-Stat beta hCG blood, ED     Status: None   Collection Time: 12/18/17   8:42 PM  Result Value Ref Range   I-stat hCG, quantitative <5.0 <5  mIU/mL   Comment 3            Comment:   GEST. AGE      CONC.  (mIU/mL)   <=1 WEEK        5 - 50     2 WEEKS       50 - 500     3 WEEKS       100 - 10,000     4 WEEKS     1,000 - 30,000        FEMALE AND NON-PREGNANT FEMALE:     LESS THAN 5 mIU/mL    Ct Abdomen Pelvis W Contrast  Result Date: 12/18/2017 CLINICAL DATA:  Generalized abdominal pain EXAM: CT ABDOMEN AND PELVIS WITH CONTRAST TECHNIQUE: Multidetector CT imaging of the abdomen and pelvis was performed using the standard protocol following bolus administration of intravenous contrast. CONTRAST:  179m ISOVUE-300 IOPAMIDOL (ISOVUE-300) INJECTION 61% COMPARISON:  CT 06/19/2017, 05/27/2017, 04/10/2017, 03/23/2017 FINDINGS: Lower chest: Lung bases demonstrate no acute consolidation or pleural effusion. The heart size is within normal limits. Hepatobiliary: No focal liver abnormality is seen. Status post cholecystectomy. No biliary dilatation. Pancreas: Unremarkable. No pancreatic ductal dilatation or surrounding inflammatory changes. Pancreas is atrophic in appearance. Spleen: Normal in size without focal abnormality. Adrenals/Urinary Tract: Adrenal glands are unremarkable. Kidneys are normal, without renal calculi, focal lesion, or hydronephrosis. Bladder is unremarkable. Stomach/Bowel: Stomach is nonenlarged. No dilated small bowel. There is diffuse under distension of the entire colon with slightly dense appearance of the opposed mucosa. No definitive inflammatory change in the fat surrounding the colon. Negative appendix. Perirectal soft tissue thickening with slight asymmetric skin thickening at the right gluteal fold. This is contiguous with linear density that extends to the right gluteal region where there is bandlike soft tissue thickening at the site of prior abscess. No fluid distension or gas in the region. Vascular/Lymphatic: Nonaneurysmal aorta. No significantly  enlarged lymph nodes Reproductive: Uterus and bilateral adnexa are unremarkable. Other: Negative for free air or free fluid. Musculoskeletal: No acute or significant osseous findings. IMPRESSION: 1. Slightly thickened appearance of the colon diffusely but without surrounding inflammatory changes in the colon fat, findings could be secondary to diffuse under distension of the colon versus a mild colitis. There is perianal soft tissue thickening. 2. Residual bandlike soft tissue density in the right gluteal region, likely representing scarring from prior abscess in the region. 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 There is no evidence for fluid collection or abscess in the soft tissues. Electronically Signed   By: KDonavan FoilM.D.   On: 12/18/2017 21:44    Pending Labs Unresulted Labs (From admission, onward)    Start     Ordered   12/19/17 0500  CBC  Tomorrow morning,   R     12/19/17 0305   12/19/17 03149 Basic metabolic panel  Tomorrow morning,   R     12/19/17 0305          Vitals/Pain Today's Vitals   12/18/17 2305 12/18/17 2340 12/19/17 0045 12/19/17 0116  BP: (!) 174/116  91/60 99/70  Pulse: (!) 121  93 (!) 101  Resp: _0 Temp: 98.2 F (36.8 C)     SpO2: 100%  100% 100%  Weight:  Height:      PainSc: 8  6       Isolation Precautions No active isolations  Medications Medications  sodium chloride 0.9 % injection (has no administration in time range)  insulin glargine (LANTUS) injection 10 Units (has no administration in time range)  insulin aspart (novoLOG) injection 0-9 Units (has no administration in time range)  citalopram (CELEXA) tablet 20 mg (has no administration in time range)  dicyclomine (BENTYL) tablet 20 mg (has no administration in time  range)  doxycycline (VIBRAMYCIN) capsule 100 mg (has no administration in time range)  ferrous sulfate tablet 325 mg (has no administration in time range)  mirtazapine (REMERON) tablet 15 mg (has no administration in time range)  pantoprazole (PROTONIX) EC tablet 40 mg (has no administration in time range)  sucralfate (CARAFATE) tablet 1 g (has no administration in time range)  metoCLOPramide (REGLAN) injection 10 mg (has no administration in time range)  acetaminophen (TYLENOL) tablet 650 mg (has no administration in time range)    Or  acetaminophen (TYLENOL) suppository 650 mg (has no administration in time range)  ondansetron (ZOFRAN) tablet 4 mg (has no administration in time range)    Or  ondansetron (ZOFRAN) injection 4 mg (has no administration in time range)  enoxaparin (LOVENOX) injection 40 mg (has no administration in time range)  0.9 %  sodium chloride infusion (has no administration in time range)  ketorolac (TORADOL) 30 MG/ML injection 30 mg (has no administration in time range)  sodium chloride 0.9 % bolus 1,000 mL (0 mLs Intravenous Stopped 12/18/17 2312)  LORazepam (ATIVAN) injection 1 mg (1 mg Intravenous Given 12/18/17 2045)  morphine 4 MG/ML injection 4 mg (4 mg Intravenous Given 12/18/17 2043)  ondansetron (ZOFRAN) injection 4 mg (4 mg Intravenous Given 12/18/17 2041)  iopamidol (ISOVUE-300) 61 % injection 100 mL (100 mLs Intravenous Contrast Given 12/18/17 2118)  LORazepam (ATIVAN) injection 1 mg (1 mg Intravenous Given 12/18/17 2307)  morphine 4 MG/ML injection 4 mg (4 mg Intravenous Given 12/18/17 2311)    Mobility walks

## 2017-12-19 NOTE — Progress Notes (Signed)
Pt has been nauseated majority of shift with intermittent vomiting. PRNs given with no relief. MD notified and new orders obtained. Melton Alar, RN

## 2017-12-19 NOTE — Progress Notes (Signed)
Pt c/o of N/V and upper, mid, abdominal pain 10/10 since shift change. Pt has taken multiple showers and staying in the shower stating that helps. No antiemetic can be given due to recent administration, tordal given for pain. On call provider paged, order to give phenergan/reglan earlier received. Pt pain staying 10/10 after Toradol given.

## 2017-12-19 NOTE — H&P (Signed)
History and Physical    [name]:[id number] DOB: [birth year] DOA: 12/18/2017  PCP: Patient, No Pcp Per  Patient coming from: Home  I have personally briefly reviewed patient's old medical records in [place]  Chief Complaint: N/V  HPI: [name] is a 27 y.o. female with medical history significant of diabetic gastroparesis and cyclic vomiting syndrome.  DM1.  Patient presents to the ED with c/o intractable N/V, upper abd pain.  Ongoing for past 2 days.  Not helped by home meds.   ED Course: Symptoms persist despite zofran and morphine in ED.   Review of Systems: As per HPI otherwise 10 point review of systems negative.   Past Medical History:  Diagnosis Date  . Allergy   . Anemia   . Anxiety   . Blood transfusion without reported diagnosis    Dec 2018  . Cataract    right eye  . Depression   . Diabetes type 1, uncontrolled ([place]) 11/14/2011   Since age 3  . Fibromyalgia   . Gastroparesis   . GERD (gastroesophageal reflux disease)   . Hypertension   . Infection    UTI April 2016    Past Surgical History:  Procedure Laterality Date  . ANKLE SURGERY    . CHOLECYSTECTOMY  11/15/2011   Procedure: LAPAROSCOPIC CHOLECYSTECTOMY WITH INTRAOPERATIVE CHOLANGIOGRAM;  Surgeon: [name], MD;  Location: [facility];  Service: General;  Laterality: N/A;  . COLONOSCOPY    . COLONOSCOPY WITH PROPOFOL N/A 06/27/2017   Procedure: COLONOSCOPY WITH PROPOFOL;  Surgeon: Milus Banister, MD;  Location: [facility];  Service: Endoscopy;  Laterality: N/A;  . ESOPHAGOGASTRODUODENOSCOPY  12/03/2011   Procedure: ESOPHAGOGASTRODUODENOSCOPY (EGD);  Surgeon: Beryle Beams, MD;  Location: [facility];  Service: Endoscopy;  Laterality: N/A;  . FLEXIBLE SIGMOIDOSCOPY N/A 03/10/2017   Procedure: FLEXIBLE SIGMOIDOSCOPY;  Surgeon: Carol Ada, MD;  Location: [facility];  Service: Endoscopy;  Laterality: N/A;  . INCISION AND DRAINAGE PERIRECTAL ABSCESS N/A 03/01/2017   Procedure: IRRIGATION AND DEBRIDEMENT PERIRECTAL ABSCESS;  Surgeon: Alphonsa Overall, MD;  Location: [facility];  Service: General;  Laterality: N/A;  . IRRIGATION AND DEBRIDEMENT BUTTOCKS N/A 03/23/2017   Procedure: IRRIGATION AND DEBRIDEMENT BUTTOCKS, SETON PLACEMENT;  Surgeon: [name], MD;  Location: [facility];  Service: General;  Laterality: N/A;  . LAPAROSCOPY  11/23/2011   Procedure: LAPAROSCOPY DIAGNOSTIC;  Surgeon: Edward Jolly, MD;  Location: [facility];  Service: General;  Laterality: N/A;  . SIGMOIDOSCOPY    . UPPER GASTROINTESTINAL ENDOSCOPY    . WISDOM TOOTH EXTRACTION       reports that she has never smoked. She has never used smokeless tobacco. She reports that she does not drink alcohol or use drugs.  Allergies  Allergen Reactions  . Other Anaphylaxis    Reaction to [place] nuts   . Lactose Intolerance (Gi) Diarrhea    Family History  Problem Relation Age of Onset  . Diabetes Mother   . Hypertension Father   . Colon cancer Paternal Grandmother        pt thinks PGM was dx in her 38's  . Esophageal cancer Neg Hx   . Liver cancer Neg Hx   . Pancreatic cancer Neg Hx   . Stomach cancer Neg Hx   . Rectal cancer Neg Hx      Prior to Admission medications   Medication Sig Start Date End Date Taking? Authorizing Provider  cholestyramine light (PREVALITE) 4 g packet Take 1 packet (4  g total) by mouth 3 (three) times daily. 03/14/17  Yes Lavina Hamman, MD  citalopram (CELEXA) 20 MG tablet Take 1 tablet (20 mg total) by mouth daily. 07/13/17  Yes Emokpae, Courage, MD  diclofenac sodium (VOLTAREN) 1 % GEL Apply 2 g topically 4 (four) times daily. 08/01/17  Yes Mikhail, Velta Addison, DO  dicyclomine (BENTYL) 20 MG tablet Take 1 tablet (20 mg total) by mouth 2 (two) times daily. 12/17/17  Yes Maczis, Barth Kirks, PA-C  doxycycline (VIBRAMYCIN) 100 MG capsule Take 1 capsule (100 mg total) by mouth 2 (two) times daily. 11/26/17  Yes Petrucelli, Samantha R, PA-C  ferrous sulfate 325 (65  FE) MG tablet Take 1 tablet (325 mg total) by mouth 2 (two) times daily with a meal. 05/13/17  Yes Henson, Vickie L, NP-C  insulin lispro (HUMALOG) 100 UNIT/ML injection Inject 0.02 mLs (2 Units total) into the skin 3 (three) times daily with meals. Patient taking differently: Inject 0-9 Units into the skin 3 (three) times daily with meals. Sliding scale 04/09/17  Yes Renato Shin, MD  LANTUS 100 UNIT/ML injection Inject 0.12 mLs (12 Units total) into the skin daily. 07/13/17  Yes Emokpae, Courage, MD  mirtazapine (REMERON) 15 MG tablet Take 1 tablet (15 mg total) by mouth at bedtime. 10/20/17  Yes Armbruster, Carlota Raspberry, MD  pantoprazole (PROTONIX) 40 MG tablet Take 1 tablet (40 mg total) by mouth daily. 12/18/17  Yes Armbruster, Carlota Raspberry, MD  promethazine (PHENERGAN) 12.5 MG tablet Take 1 tablet (12.5 mg total) by mouth every 8 (eight) hours as needed for nausea or vomiting. 10/02/17  Yes Armbruster, Carlota Raspberry, MD  sucralfate (CARAFATE) 1 g tablet Take 1 tablet (1 g total) by mouth 4 (four) times daily -  with meals and at bedtime. 09/17/17  Yes Daleen Bo, MD  acetaminophen (TYLENOL) 500 MG tablet Take 1,000 mg by mouth daily as needed for moderate pain.     [provider]  metoCLOPramide (REGLAN) 10 MG tablet Take 1 tablet (10 mg total) by mouth every 6 (six) hours as needed for nausea or vomiting. 09/02/17   Isla Pence, MD  ondansetron (ZOFRAN ODT) 4 MG disintegrating tablet Take 1 tablet (4 mg total) by mouth every 8 (eight) hours as needed for nausea or vomiting. 10/02/17   Armbruster, Carlota Raspberry, MD  ondansetron (ZOFRAN) 8 MG tablet Take 1 tablet (8 mg total) by mouth every 8 (eight) hours as needed for nausea or vomiting. 09/17/17   Daleen Bo, MD    Physical Exam: Vitals:   12/18/17 2100 12/18/17 2305 12/19/17 0045 12/19/17 0116  BP: (!) 176/114 (!) 174/116 91/60 99/70  Pulse: (!) 115 (!) 121 93 (!) 101  Resp: 17 20 16 16   Temp:  98.2 F (36.8 C)    SpO2: 100% 100% 100% 100%    Weight:      Height:        Constitutional: NAD, calm, comfortable Eyes: PERRL, lids and conjunctivae normal ENMT: Mucous membranes are moist. Posterior pharynx clear of any exudate or lesions.Normal dentition.  Neck: normal, supple, no masses, no thyromegaly Respiratory: clear to auscultation bilaterally, no wheezing, no crackles. Normal respiratory effort. No accessory muscle use.  Cardiovascular: Regular rate and rhythm, no murmurs / rubs / gallops. No extremity edema. 2+ pedal pulses. No carotid bruits.  Abdomen: no tenderness, no masses palpated. No hepatosplenomegaly. Bowel sounds positive.  Musculoskeletal: no clubbing / cyanosis. No joint deformity upper and lower extremities. Good ROM, no contractures. Normal muscle tone.  Skin: no rashes, lesions, ulcers. No induration Neurologic: CN 2-12 grossly intact. Sensation intact, DTR normal. Strength 5/5 in all 4.  Psychiatric: Normal judgment and insight. Alert and oriented x 3. Normal mood.    Labs on Admission: I have personally reviewed following labs and imaging studies  CBC: Recent Labs  Lab 12/17/17 0610 12/18/17 1636  WBC 5.0 9.0  NEUTROABS 1.8  --   HGB 11.5* 11.6*  HCT 33.6* 33.7*  MCV 85.3 83.8  PLT 274 008   Basic Metabolic Panel: Recent Labs  Lab 12/17/17 0610 12/18/17 1636  NA 139 140  K 4.0 4.0  CL 99 101  CO2 31 25  GLUCOSE 96 305*  BUN 20 21*  CREATININE 0.82 0.59  CALCIUM 9.5 9.4   GFR: Estimated Creatinine Clearance: 97.5 mL/min (by C-G formula based on SCr of 0.59 mg/dL). Liver Function Tests: Recent Labs  Lab 12/17/17 0610 12/18/17 1636  AST 27 25  ALT 27 25  ALKPHOS 84 84  BILITOT 0.6 0.8  PROT 7.9 8.2*  ALBUMIN 3.4* 3.9   Recent Labs  Lab 12/17/17 0610 12/18/17 1636  LIPASE 20 18   No results for input(s): AMMONIA in the last 168 hours. Coagulation Profile: No results for input(s): INR, PROTIME in the last 168 hours. Cardiac Enzymes: No results for input(s): CKTOTAL,  CKMB, CKMBINDEX, TROPONINI in the last 168 hours. BNP (last 3 results) No results for input(s): PROBNP in the last 8760 hours. HbA1C: No results for input(s): HGBA1C in the last 72 hours. CBG: Recent Labs  Lab 12/17/17 0630  GLUCAP 91   Lipid Profile: No results for input(s): CHOL, HDL, LDLCALC, TRIG, CHOLHDL, LDLDIRECT in the last 72 hours. Thyroid Function Tests: No results for input(s): TSH, T4TOTAL, FREET4, T3FREE, THYROIDAB in the last 72 hours. Anemia Panel: No results for input(s): VITAMINB12, FOLATE, FERRITIN, TIBC, IRON, RETICCTPCT in the last 72 hours. Urine analysis:    Component Value Date/Time   COLORURINE YELLOW 12/18/2017 [place] 12/18/2017 1636   LABSPEC 1.024 12/18/2017 1636   PHURINE 5.0 12/18/2017 1636   GLUCOSEU >=500 (A) 12/18/2017 1636   HGBUR MODERATE (A) 12/18/2017 1636   BILIRUBINUR NEGATIVE 12/18/2017 1636   BILIRUBINUR negative 01/02/2015 1717   KETONESUR 20 (A) 12/18/2017 1636   PROTEINUR 100 (A) 12/18/2017 1636   UROBILINOGEN 0.2 01/13/2015 0223   NITRITE NEGATIVE 12/18/2017 1636   LEUKOCYTESUR NEGATIVE 12/18/2017 1636    Radiological Exams on Admission: Ct Abdomen Pelvis W Contrast  Result Date: 12/18/2017 CLINICAL DATA:  Generalized abdominal pain EXAM: CT ABDOMEN AND PELVIS WITH CONTRAST TECHNIQUE: Multidetector CT imaging of the abdomen and pelvis was performed using the standard protocol following bolus administration of intravenous contrast. CONTRAST:  165m ISOVUE-300 IOPAMIDOL (ISOVUE-300) INJECTION 61% COMPARISON:  CT 06/19/2017, 05/27/2017, 04/10/2017, 03/23/2017 FINDINGS: Lower chest: Lung bases demonstrate no acute consolidation or pleural effusion. The heart size is within normal limits. Hepatobiliary: No focal liver abnormality is seen. Status post cholecystectomy. No biliary dilatation. Pancreas: Unremarkable. No pancreatic ductal dilatation or surrounding inflammatory changes. Pancreas is atrophic in appearance.  Spleen: Normal in size without focal abnormality. Adrenals/Urinary Tract: Adrenal glands are unremarkable. Kidneys are normal, without renal calculi, focal lesion, or hydronephrosis. Bladder is unremarkable. Stomach/Bowel: Stomach is nonenlarged. No dilated small bowel. There is diffuse under distension of the entire colon with slightly dense appearance of the opposed mucosa. No definitive inflammatory change in the fat surrounding the colon. Negative appendix. Perirectal soft tissue thickening with slight asymmetric skin thickening  at the right gluteal fold. This is contiguous with linear density that extends to the right gluteal region where there is bandlike soft tissue thickening at the site of prior abscess. No fluid distension or gas in the region. Vascular/Lymphatic: Nonaneurysmal aorta. No significantly enlarged lymph nodes Reproductive: Uterus and bilateral adnexa are unremarkable. Other: Negative for free air or free fluid. Musculoskeletal: No acute or significant osseous findings. IMPRESSION: 1. Slightly thickened appearance of the colon diffusely but without surrounding inflammatory changes in the colon fat, findings could be secondary to diffuse under distension of the colon versus a mild colitis. There is perianal soft tissue thickening. 2. Residual bandlike soft tissue density in the right gluteal region, likely representing scarring from prior abscess in the region. 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 There is no evidence for fluid collection or abscess in the soft tissues. Electronically Signed   By: Donavan Foil M.D.   On: 12/18/2017 21:44    EKG: Independently reviewed.  Assessment/Plan Principal Problem:   Intractable nausea and vomiting Active Problems:   Gastroparesis    Diabetes mellitus type 1 ([place])    1. Intractable nausea and vomiting due to diabetic gastroparesis - 1. Reglan 65m IV scheduled 2. PRN zofran 3. Avoid narcotics 4. Toradol PRN pain 5. IVF: NS at 100 cc/hr 6. Clear liquid diet when able to take POs 7. Repeat BMP in AM 2. DM1 - 1. Will do Lantus 10 Daily 2. And sensitive SSI Q4H 3. No AG on initial labs.  DVT prophylaxis: Lovenox Code Status: Full Family Communication: Family at bedside Disposition Plan: Home after admit Consults called: None Admission status[facility] Pager [phone] works nights!  If 7AM-7PM, please contact the primary day team physician taking care of patient  [email or web address] Password [email or web address]  12/19/2017, 3:07 AM

## 2017-12-19 NOTE — Progress Notes (Signed)
Pt still nauseated, c/o of pain, in and out of shower, popsicle provided, pt refusing cold compress, refusing all meds at this time besides antiemtics and pain med.

## 2017-12-20 DIAGNOSIS — E1043 Type 1 diabetes mellitus with diabetic autonomic (poly)neuropathy: Secondary | ICD-10-CM | POA: Diagnosis not present

## 2017-12-20 DIAGNOSIS — K3184 Gastroparesis: Secondary | ICD-10-CM | POA: Diagnosis not present

## 2017-12-20 DIAGNOSIS — R112 Nausea with vomiting, unspecified: Secondary | ICD-10-CM | POA: Diagnosis not present

## 2017-12-20 DIAGNOSIS — Z23 Encounter for immunization: Secondary | ICD-10-CM | POA: Diagnosis not present

## 2017-12-20 LAB — BASIC METABOLIC PANEL
Anion gap: 16 — ABNORMAL HIGH (ref 5–15)
BUN: 27 mg/dL — ABNORMAL HIGH (ref 6–20)
CO2: 22 mmol/L (ref 22–32)
Calcium: 8.9 mg/dL (ref 8.9–10.3)
Chloride: 101 mmol/L (ref 98–111)
Creatinine, Ser: 0.67 mg/dL (ref 0.44–1.00)
GFR calc Af Amer: >60 mL/min (ref >60)
GFR calc non Af Amer: >60 mL/min (ref >60)
Glucose, Bld: 219 mg/dL — ABNORMAL HIGH (ref 70–99)
Potassium: 3.2 mmol/L — ABNORMAL LOW (ref 3.5–5.1)
Sodium: 139 mmol/L (ref 135–145)

## 2017-12-20 LAB — CBC
HCT: 31.7 % — ABNORMAL LOW (ref 36.0–46.0)
Hemoglobin: 10.9 g/dL — ABNORMAL LOW (ref 12.0–15.0)
MCH: 29.3 pg (ref 26.0–34.0)
MCHC: 34.4 g/dL (ref 30.0–36.0)
MCV: 85.2 fL (ref 78.0–100.0)
Platelets: 226 10*3/uL (ref 150–400)
RBC: 3.72 MIL/uL — ABNORMAL LOW (ref 3.87–5.11)
RDW: 12.2 % (ref 11.5–15.5)
WBC: 5.7 10*3/uL (ref 4.0–10.5)

## 2017-12-20 LAB — GLUCOSE, CAPILLARY
Glucose-Capillary: 128 mg/dL — ABNORMAL HIGH (ref 70–99)
Glucose-Capillary: 185 mg/dL — ABNORMAL HIGH (ref 70–99)
Glucose-Capillary: 193 mg/dL — ABNORMAL HIGH (ref 70–99)
Glucose-Capillary: 214 mg/dL — ABNORMAL HIGH (ref 70–99)
Glucose-Capillary: 93 mg/dL (ref 70–99)
Glucose-Capillary: 97 mg/dL (ref 70–99)

## 2017-12-20 LAB — RAPID URINE DRUG SCREEN, HOSP PERFORMED
Amphetamines: NOT DETECTED
Barbiturates: NOT DETECTED
Benzodiazepines: NOT DETECTED
Cocaine: NOT DETECTED
Opiates: POSITIVE — AB
Tetrahydrocannabinol: POSITIVE — AB

## 2017-12-20 MED ORDER — HYOSCYAMINE SULFATE 0.5 MG/ML IJ SOLN
0.5000 mg | Freq: Once | INTRAMUSCULAR | Status: DC
  Filled 2017-12-20: qty 1

## 2017-12-20 MED ORDER — METOPROLOL TARTRATE 5 MG/5ML IV SOLN
5.0000 mg | INTRAVENOUS | Status: AC | PRN
  Administered 2017-12-20 (×2): 5 mg via INTRAVENOUS
  Filled 2017-12-20 (×2): qty 5

## 2017-12-20 MED ORDER — OXYCODONE-ACETAMINOPHEN 5-325 MG PO TABS
1.0000 | ORAL_TABLET | Freq: Four times a day (QID) | ORAL | Status: DC | PRN
  Administered 2017-12-20 – 2017-12-25 (×9): 2 via ORAL
  Filled 2017-12-20 (×9): qty 2

## 2017-12-20 MED ORDER — PROMETHAZINE HCL 25 MG PO TABS: 25.0000 mg | ORAL_TABLET | Freq: Four times a day (QID) | ORAL | Status: DC | PRN

## 2017-12-20 MED ORDER — PROMETHAZINE HCL 25 MG PO TABS
25.0000 mg | ORAL_TABLET | Freq: Four times a day (QID) | ORAL | Status: DC | PRN
  Administered 2017-12-20 – 2017-12-22 (×7): 25 mg via ORAL
  Filled 2017-12-20 (×7): qty 1

## 2017-12-20 MED ORDER — MORPHINE SULFATE (PF) 4 MG/ML IV SOLN
4.0000 mg | INTRAVENOUS | Status: DC | PRN
  Administered 2017-12-20 – 2017-12-25 (×24): 4 mg via INTRAVENOUS
  Filled 2017-12-20 (×24): qty 1

## 2017-12-20 MED ORDER — ENALAPRILAT 1.25 MG/ML IV SOLN
1.2500 mg | Freq: Once | INTRAVENOUS | Status: DC
  Filled 2017-12-20: qty 1

## 2017-12-20 MED ORDER — CAPSAICIN 0.025 % EX CREA
TOPICAL_CREAM | Freq: Two times a day (BID) | CUTANEOUS | Status: DC
  Administered 2017-12-20 – 2017-12-24 (×6): via TOPICAL
  Filled 2017-12-20: qty 60

## 2017-12-20 NOTE — Progress Notes (Signed)
BP 167/115 HR 121 X. [place] paged 3x times awaiting orders

## 2017-12-20 NOTE — Progress Notes (Signed)
Pt continues to take showers for extended periods of time. Pt anxious, pacing, c/o of abdominal pain described as cramping, hypertensive and tachy, states had diarrhea yestertday-asked patient if there is a chance of having withdrawals from anything due to s/s, pt denied, no tox screen done and hx of substance abuse. Consulted with charge nurse and the [place]. Tele sitter ordered and set up for safety and possible suspicious activity. Asked pt to hold on to any emesis to assess, shortly after the  Tele sitter called stating pt is sticking fingers down throat making self throw up. Pt asked to stop doing that. Sitting at bedside pt, no vomiting only spitting up saliva. Will continue to monitor.

## 2017-12-20 NOTE — Progress Notes (Signed)
Received call from Telesitter that patient is "throwing up and rolling around on bed". Asked if Telesitter witnessed her inducing vomiting and she said "no". Has clear fluid in emesis bag. Patient has been sleeping for 2 hours with no complaints. Now is saying has pain 10/10 and needs "IV Phenergan". She has also refused po meds and food/drinks. Will give available PRNs and monitor.Melton Alar, RN

## 2017-12-20 NOTE — Progress Notes (Addendum)
Patient still c/o nausea and vomiting. Clear emesis approximately 20 cc present in emesis bag. Patient has had 2 prns already for n/v and is requesting more. Has taken shower to "relieve pain".  MD notified. Orders received. Melton Alar, RN

## 2017-12-20 NOTE — Progress Notes (Signed)
PROGRESS NOTE    [name]  OZH:[id number] DOB: 27-Aug-1990 DOA: 12/18/2017 PCP: Patient, No Pcp Per   Brief Narrative: [name] is a 27 y.o. female with medical history significant of diabetes mellitus type 1, diabetic gastroparesis and cyclic vomiting syndrome.   Assessment & Plan:   Principal Problem:   Intractable nausea and vomiting Active Problems:   Gastroparesis   Diabetes mellitus type 1 (HCC)   Intractable nausea and vomiting Thought secondary to gastroparesis. Overnight, per report, patient found inducing vomiting. Symptoms also relieved with warm showers and patient has been taking multiple. Question cannabis  hyperemesis syndrome. Patient denies substance use. -Continue Reglan -Phenergan PO, Zofran PO/IV if Phenergan not working -Clear liquid diet -UDS  Abdominal pain Unknown etiology. Patient with mild colitis on CT scan. Epigastric. No bloody bowel movements -Percocet prn, capsaicin cream -Morphine for breakthrough only -Continue cipro/flagyl  Diabetes mellitus, type 1 -Continue Lantus -Continue SSI q4 hours while not taking PO well   DVT prophylaxis: Lovenox Code Status:   Code Status: Full Code Family Communication: None at bedside Disposition Plan: Discharge pending clinical improvement   Consultants:   None  Procedures:   None  Antimicrobials:  Ciprofloxacin  Flagyl    Subjective: Patient reports epigastric pain that is burning in addition to continued nausea and vomiting. Patient found to be inducing vomiting overnight. Patient is now saying that she has dysphagia and cannot swallow pills, although she has not vomited spontaneously recently. Wants to know if she can get one more dose of IV   Objective: Vitals:   12/20/17 0616 12/20/17 0658 12/20/17 0941 12/20/17 1135  BP: (!) 156/102 137/84 (!) 171/115 (!) 150/99  Pulse: (!) 101 97 (!) 103 (!) 102  Resp:   20 12  Temp: 99 F (37.2 C)   98.6 F (37 C)  TempSrc: Oral    Oral  SpO2:   100% 99%  Weight:      Height:       No intake or output data in the 24 hours ending 12/20/17 1222 Filed Weights   12/18/17 1928  Weight: 59 kg    Examination:  General exam: Appears uncomfortable Respiratory system: Clear to auscultation. Respiratory effort normal. Cardiovascular system: S1 & S2 heard, RRR. No murmurs, rubs, gallops or clicks. Gastrointestinal system: Abdomen is nondistended, soft and nontender. Normal bowel sounds heard. Central nervous system: Alert and oriented. No focal neurological deficits. Extremities: No edema. No calf tenderness Skin: No cyanosis. No rashes Psychiatry: Judgement and insight appear normal. Tearful    Data Reviewed: I have personally reviewed following labs and imaging studies  CBC: Recent Labs  Lab 12/17/17 0610 12/18/17 1636 12/19/17 0501 12/20/17 0852  WBC 5.0 9.0 7.2 5.7  NEUTROABS 1.8  --   --   --   HGB 11.5* 11.6* 12.4 10.9*  HCT 33.6* 33.7* 35.6* 31.7*  MCV 85.3 83.8 83.2 85.2  PLT 274 259 253 629   Basic Metabolic Panel: Recent Labs  Lab 12/17/17 0610 12/18/17 1636 12/19/17 0501 12/20/17 0852  NA 139 140 142 139  K 4.0 4.0 4.1 3.2*  CL 99 101 102 101  CO2 31 25 22 22   GLUCOSE 96 305* 272* 219*  BUN 20 21* 19 27*  CREATININE 0.82 0.59 0.65 0.67  CALCIUM 9.5 9.4 9.3 8.9   GFR: Estimated Creatinine Clearance: 97.5 mL/min (by C-G formula based on SCr of 0.67 mg/dL). Liver Function Tests: Recent Labs  Lab 12/17/17 0610 12/18/17 1636  AST 27  25  ALT 27 25  ALKPHOS 84 84  BILITOT 0.6 0.8  PROT 7.9 8.2*  ALBUMIN 3.4* 3.9   Recent Labs  Lab 12/17/17 0610 12/18/17 1636  LIPASE 20 18   No results for input(s): AMMONIA in the last 168 hours. Coagulation Profile: No results for input(s): INR, PROTIME in the last 168 hours. Cardiac Enzymes: No results for input(s): CKTOTAL, CKMB, CKMBINDEX, TROPONINI in the last 168 hours. BNP (last 3 results) No results for input(s): PROBNP in the  last 8760 hours. HbA1C: No results for input(s): HGBA1C in the last 72 hours. CBG: Recent Labs  Lab 12/19/17 2059 12/20/17 0042 12/20/17 0430 12/20/17 0734 12/20/17 1132  GLUCAP 192* 214* 97 185* 193*   Lipid Profile: No results for input(s): CHOL, HDL, LDLCALC, TRIG, CHOLHDL, LDLDIRECT in the last 72 hours. Thyroid Function Tests: No results for input(s): TSH, T4TOTAL, FREET4, T3FREE, THYROIDAB in the last 72 hours. Anemia Panel: No results for input(s): VITAMINB12, FOLATE, FERRITIN, TIBC, IRON, RETICCTPCT in the last 72 hours. Sepsis Labs: No results for input(s): PROCALCITON, LATICACIDVEN in the last 168 hours.  Recent Results (from the past 240 hour(s))  Culture, blood (routine x 2)     Status: None (Preliminary result)   Collection Time: 12/19/17  6:50 PM  Result Value Ref Range Status   Specimen Description   Final    BLOOD LEFT ANTECUBITAL BOTTLES DRAWN AEROBIC AND ANAEROBIC Blood Culture adequate volume Performed at [facility] [address] [place], [place] [address]    Special Requests   Final    NONE Performed at [facility], [place] [address], [place], [place] [address]    Culture   Final    NO GROWTH < 12 HOURS Performed at [facility] [address], [place], [place] [address]    Report Status PENDING  Incomplete  Culture, blood (routine x 2)     Status: None (Preliminary result)   Collection Time: 12/19/17  6:59 PM  Result Value Ref Range Status   Specimen Description   Final    BLOOD RIGHT ANTECUBITAL BOTTLES DRAWN AEROBIC ONLY Blood Culture adequate volume Performed at [facility] [address] [place], [place] [address]    Special Requests   Final    NONE Performed at [facility], [place] [address], [place], [place] [address]    Culture   Final    NO GROWTH < 12 HOURS Performed at [facility] [address], [place], [place] [address]    Report Status  PENDING  Incomplete         Radiology Studies: Ct Abdomen Pelvis W Contrast  Result Date: 12/18/2017 CLINICAL DATA:  Generalized abdominal pain EXAM: CT ABDOMEN AND PELVIS WITH CONTRAST TECHNIQUE: Multidetector CT imaging of the abdomen and pelvis was performed using the standard protocol following bolus administration of intravenous contrast. CONTRAST:  166m ISOVUE-300 IOPAMIDOL (ISOVUE-300) INJECTION 61% COMPARISON:  CT 06/19/2017, 05/27/2017, 04/10/2017, 03/23/2017 FINDINGS: Lower chest: Lung bases demonstrate no acute consolidation or pleural effusion. The heart size is within normal limits. Hepatobiliary: No focal liver abnormality is seen. Status post cholecystectomy. No biliary dilatation. Pancreas: Unremarkable. No pancreatic ductal dilatation or surrounding inflammatory changes. Pancreas is atrophic in appearance. Spleen: Normal in size without focal abnormality. Adrenals/Urinary Tract: Adrenal glands are unremarkable. Kidneys are normal, without renal calculi, focal lesion, or hydronephrosis. Bladder is unremarkable. Stomach/Bowel: Stomach is nonenlarged. No dilated small bowel. There is diffuse under distension of  the entire colon with slightly dense appearance of the opposed mucosa. No definitive inflammatory change in the fat surrounding the colon. Negative appendix. Perirectal soft tissue thickening with slight asymmetric skin thickening at the right gluteal fold. This is contiguous with linear density that extends to the right gluteal region where there is bandlike soft tissue thickening at the site of prior abscess. No fluid distension or gas in the region. Vascular/Lymphatic: Nonaneurysmal aorta. No significantly enlarged lymph nodes Reproductive: Uterus and bilateral adnexa are unremarkable. Other: Negative for free air or free fluid. Musculoskeletal: No acute or significant osseous findings. IMPRESSION: 1. Slightly thickened appearance of the colon diffusely but without surrounding  inflammatory changes in the colon fat, findings could be secondary to diffuse under distension of the colon versus a mild colitis. There is perianal soft tissue thickening. 2. Residual bandlike soft tissue density in the right gluteal region, likely representing scarring from prior abscess in the region. 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 There is no evidence for fluid collection or abscess in the soft tissues. Electronically Signed   By: Donavan Foil M.D.   On: 12/18/2017 21:44   Dg Abd Acute W/chest  Result Date: 12/19/2017 CLINICAL DATA:  Upper abdominal pain with vomiting and diarrhea. EXAM: DG ABDOMEN ACUTE W/ 1V CHEST COMPARISON:  CT scan from yesterday. FINDINGS: The lungs are clear without focal pneumonia, edema, pneumothorax or pleural effusion. The cardiopericardial silhouette is within normal limits for size. The visualized bony structures of the thorax are intact. Upright film shows no evidence for intraperitoneal free air. No gaseous bowel dilatation to suggest obstruction. Moderate air-fluid level noted in the stomach. Bladder is opacified with contrast, consistent with CT scan yesterday. IMPRESSION: 1. No acute cardiopulmonary process. 2. No evidence for bowel obstruction or perforation. Electronically Signed   By: Misty Stanley M.D.   On: 12/19/2017 20:17        Scheduled Meds: . capsaicin   Topical BID  . citalopram  20 mg Oral Daily  . dicyclomine  20 mg Oral BID  . enoxaparin (LOVENOX) injection  40 mg Subcutaneous Daily  . ferrous sulfate  325 mg Oral BID WC  . hyoscyamine  0.5 mg Intravenous Once  . insulin aspart  0-9 Units Subcutaneous Q4H  . insulin glargine  10 Units Subcutaneous Daily  . metoCLOPramide (REGLAN) injection  10 mg Intravenous Q6H  .  mirtazapine  15 mg Oral QHS  . pantoprazole  40 mg Oral Daily  . sucralfate  1 g Oral TID WC & HS   Continuous Infusions: . sodium chloride 100 mL/hr at 12/20/17 0552  . ciprofloxacin 400 mg (12/20/17 0744)  . metronidazole 500 mg (12/20/17 0553)     LOS: 0 days     Cordelia Poche, MD [facility] 12/20/2017, 12:22 PM Pager: [phone]  If 7PM-7AM, please contact night-coverage [email or web address] 12/20/2017, 12:22 PM

## 2017-12-20 NOTE — Progress Notes (Signed)
Patients states she has only voided once this am. Has no urge to void. Bladder scan shows 721 ml in bladder. Pt to [facility] and voided 200 cc. Will monitor. PO intake very poor today. Melton Alar, RN

## 2017-12-21 DIAGNOSIS — D649 Anemia, unspecified: Secondary | ICD-10-CM | POA: Diagnosis present

## 2017-12-21 DIAGNOSIS — I160 Hypertensive urgency: Secondary | ICD-10-CM

## 2017-12-21 DIAGNOSIS — K3184 Gastroparesis: Secondary | ICD-10-CM | POA: Diagnosis not present

## 2017-12-21 DIAGNOSIS — Z79899 Other long term (current) drug therapy: Secondary | ICD-10-CM | POA: Diagnosis not present

## 2017-12-21 DIAGNOSIS — R112 Nausea with vomiting, unspecified: Secondary | ICD-10-CM | POA: Diagnosis not present

## 2017-12-21 DIAGNOSIS — E1043 Type 1 diabetes mellitus with diabetic autonomic (poly)neuropathy: Secondary | ICD-10-CM | POA: Diagnosis not present

## 2017-12-21 DIAGNOSIS — E108 Type 1 diabetes mellitus with unspecified complications: Secondary | ICD-10-CM | POA: Diagnosis not present

## 2017-12-21 DIAGNOSIS — Z9049 Acquired absence of other specified parts of digestive tract: Secondary | ICD-10-CM | POA: Diagnosis not present

## 2017-12-21 DIAGNOSIS — Z23 Encounter for immunization: Secondary | ICD-10-CM | POA: Diagnosis not present

## 2017-12-21 DIAGNOSIS — F419 Anxiety disorder, unspecified: Secondary | ICD-10-CM | POA: Diagnosis present

## 2017-12-21 DIAGNOSIS — R1110 Vomiting, unspecified: Secondary | ICD-10-CM | POA: Diagnosis present

## 2017-12-21 DIAGNOSIS — Z91018 Allergy to other foods: Secondary | ICD-10-CM | POA: Diagnosis not present

## 2017-12-21 DIAGNOSIS — R197 Diarrhea, unspecified: Secondary | ICD-10-CM | POA: Diagnosis not present

## 2017-12-21 DIAGNOSIS — Z765 Malingerer [conscious simulation]: Secondary | ICD-10-CM | POA: Diagnosis not present

## 2017-12-21 DIAGNOSIS — R933 Abnormal findings on diagnostic imaging of other parts of digestive tract: Secondary | ICD-10-CM | POA: Diagnosis not present

## 2017-12-21 DIAGNOSIS — T407X5A Adverse effect of cannabis (derivatives), initial encounter: Secondary | ICD-10-CM | POA: Diagnosis not present

## 2017-12-21 DIAGNOSIS — G43A0 Cyclical vomiting, not intractable: Secondary | ICD-10-CM | POA: Diagnosis not present

## 2017-12-21 DIAGNOSIS — R1013 Epigastric pain: Secondary | ICD-10-CM | POA: Diagnosis not present

## 2017-12-21 DIAGNOSIS — R948 Abnormal results of function studies of other organs and systems: Secondary | ICD-10-CM | POA: Diagnosis present

## 2017-12-21 DIAGNOSIS — K529 Noninfective gastroenteritis and colitis, unspecified: Secondary | ICD-10-CM | POA: Diagnosis not present

## 2017-12-21 DIAGNOSIS — I10 Essential (primary) hypertension: Secondary | ICD-10-CM | POA: Diagnosis present

## 2017-12-21 DIAGNOSIS — M797 Fibromyalgia: Secondary | ICD-10-CM | POA: Diagnosis present

## 2017-12-21 DIAGNOSIS — R109 Unspecified abdominal pain: Secondary | ICD-10-CM | POA: Diagnosis not present

## 2017-12-21 DIAGNOSIS — G43A1 Cyclical vomiting, intractable: Secondary | ICD-10-CM | POA: Diagnosis not present

## 2017-12-21 DIAGNOSIS — E876 Hypokalemia: Secondary | ICD-10-CM | POA: Diagnosis not present

## 2017-12-21 DIAGNOSIS — F329 Major depressive disorder, single episode, unspecified: Secondary | ICD-10-CM | POA: Diagnosis present

## 2017-12-21 DIAGNOSIS — E739 Lactose intolerance, unspecified: Secondary | ICD-10-CM | POA: Diagnosis present

## 2017-12-21 LAB — BASIC METABOLIC PANEL
Anion gap: 14 (ref 5–15)
BUN: 19 mg/dL (ref 6–20)
CO2: 25 mmol/L (ref 22–32)
Calcium: 8.7 mg/dL — ABNORMAL LOW (ref 8.9–10.3)
Chloride: 99 mmol/L (ref 98–111)
Creatinine, Ser: 0.54 mg/dL (ref 0.44–1.00)
GFR calc Af Amer: >60 mL/min (ref >60)
GFR calc non Af Amer: >60 mL/min (ref >60)
Glucose, Bld: 184 mg/dL — ABNORMAL HIGH (ref 70–99)
Potassium: 3.1 mmol/L — ABNORMAL LOW (ref 3.5–5.1)
Sodium: 138 mmol/L (ref 135–145)

## 2017-12-21 LAB — GLUCOSE, CAPILLARY
Glucose-Capillary: 177 mg/dL — ABNORMAL HIGH (ref 70–99)
Glucose-Capillary: 110 mg/dL — ABNORMAL HIGH (ref 70–99)
Glucose-Capillary: 126 mg/dL — ABNORMAL HIGH (ref 70–99)
Glucose-Capillary: 97 mg/dL (ref 70–99)
Glucose-Capillary: 79 mg/dL (ref 70–99)

## 2017-12-21 MED ORDER — ADULT MULTIVITAMIN W/MINERALS CH
1.0000 | ORAL_TABLET | Freq: Every day | ORAL | Status: DC
  Administered 2017-12-25: 1 via ORAL
  Filled 2017-12-21 (×2): qty 1

## 2017-12-21 MED ORDER — HYDRALAZINE HCL 20 MG/ML IJ SOLN
5.0000 mg | Freq: Four times a day (QID) | INTRAMUSCULAR | Status: DC | PRN
  Administered 2017-12-21 – 2017-12-22 (×2): 5 mg via INTRAVENOUS
  Filled 2017-12-21 (×2): qty 1

## 2017-12-21 MED ORDER — BOOST / RESOURCE BREEZE PO LIQD CUSTOM
1.0000 | Freq: Two times a day (BID) | ORAL | Status: DC
  Administered 2017-12-24: 1 via ORAL

## 2017-12-21 MED ORDER — LOSARTAN POTASSIUM 25 MG PO TABS
25.0000 mg | ORAL_TABLET | Freq: Every day | ORAL | Status: DC
  Filled 2017-12-21: qty 1

## 2017-12-21 MED ORDER — GI COCKTAIL ~~LOC~~
30.0000 mL | Freq: Once | ORAL | Status: AC
  Administered 2017-12-21: 30 mL via ORAL
  Filled 2017-12-21: qty 30

## 2017-12-21 NOTE — Progress Notes (Signed)
PROGRESS NOTE    [name]  UXL:[id number] DOB: [birth year] DOA: 12/18/2017 PCP: Patient, No Pcp Per   Brief Narrative: [name] is a 27 y.o. female with medical history significant of diabetes mellitus type 1, diabetic gastroparesis and cyclic vomiting syndrome.   Assessment & Plan:   Principal Problem:   Intractable nausea and vomiting Active Problems:   Gastroparesis   Diabetes mellitus type 1 (HCC)   Intractable nausea and vomiting Thought secondary to gastroparesis. Overnight, per report, patient found inducing vomiting. Symptoms also relieved with warm showers and patient has been taking multiple. Question cannabis  hyperemesis syndrome. Patient denies substance use. Recent EGD unremarkable except for findings consistent with gastroparesis. -Continue Reglan -Phenergan PO, Zofran PO/IV if Phenergan not working -[employer]. Patient encouraged to try eating  Abdominal pain Unknown etiology. Patient with mild colitis on CT scan. Epigastric. No bloody bowel movements. Still with significant abdominal pain limiting function. -Percocet prn, capsaicin cream -Morphine for breakthrough only -Continue cipro/flagyl -GI cocktail  Diabetes mellitus, type 1 No evidence of DKA. No hypoglycemia. -Continue Lantus -Continue SSI q4 hours while not taking PO well  Hypertensive urgency On chart review, patient has a history of significant hypertension -start lisinopril 10 mg daily   DVT prophylaxis: Lovenox Code Status:   Code Status: Full Code Family Communication: None at bedside Disposition Plan: Discharge pending clinical improvement   Consultants:   None  Procedures:   None  Antimicrobials:  Ciprofloxacin  Flagyl    Subjective: Patient with continued abdominal pain. Improvement with showers. Capsaicin helping slightly.  Objective: Vitals:   12/20/17 1412 12/20/17 2106 12/21/17 0501 12/21/17 1341  BP: 134/89 (!) 171/116 (!) 178/116   Pulse: (!)  102 (!) 111 (!) 113   Resp: 16 16 16    Temp: 98.7 F (37.1 C) 98.5 F (36.9 C) 98.6 F (37 C)   TempSrc: Oral Oral Oral   SpO2: 100% 100% 99%   Weight:    59.6 kg  Height:        Intake/Output Summary (Last 24 hours) at 12/21/2017 1508 Last data filed at 12/20/2017 1642 Gross per 24 hour  Intake 275.96 ml  Output -  Net 275.96 ml   Filed Weights   12/18/17 1928 12/21/17 1341  Weight: 59 kg 59.6 kg    Examination:  General exam: Appears uncomfortable Respiratory system: Clear to auscultation. Respiratory effort normal. Cardiovascular system: S1 & S2 heard, RRR. No murmurs, rubs, gallops or clicks. Gastrointestinal system: Abdomen is nondistended, soft and nontender. Normal bowel sounds heard. Central nervous system: Alert and oriented. No focal neurological deficits. Extremities: No edema. No calf tenderness Skin: No cyanosis. No rashes Psychiatry: Judgement and insight appear normal. Tearful    Data Reviewed: I have personally reviewed following labs and imaging studies  CBC: Recent Labs  Lab 12/17/17 0610 12/18/17 1636 12/19/17 0501 12/20/17 0852  WBC 5.0 9.0 7.2 5.7  NEUTROABS 1.8  --   --   --   HGB 11.5* 11.6* 12.4 10.9*  HCT 33.6* 33.7* 35.6* 31.7*  MCV 85.3 83.8 83.2 85.2  PLT 274 259 253 536   Basic Metabolic Panel: Recent Labs  Lab 12/17/17 0610 12/18/17 1636 12/19/17 0501 12/20/17 0852 12/21/17 0511  NA 139 140 142 139 138  K 4.0 4.0 4.1 3.2* 3.1*  CL 99 101 102 101 99  CO2 31 25 22 22 25   GLUCOSE 96 305* 272* 219* 184*  BUN 20 21* 19 27* 19  CREATININE 0.82 0.59 0.65  0.67 0.54  CALCIUM 9.5 9.4 9.3 8.9 8.7*   GFR: Estimated Creatinine Clearance: 98.5 mL/min (by C-G formula based on SCr of 0.54 mg/dL). Liver Function Tests: Recent Labs  Lab 12/17/17 0610 12/18/17 1636  AST 27 25  ALT 27 25  ALKPHOS 84 84  BILITOT 0.6 0.8  PROT 7.9 8.2*  ALBUMIN 3.4* 3.9   Recent Labs  Lab 12/17/17 0610 12/18/17 1636  LIPASE 20 18   No  results for input(s): AMMONIA in the last 168 hours. Coagulation Profile: No results for input(s): INR, PROTIME in the last 168 hours. Cardiac Enzymes: No results for input(s): CKTOTAL, CKMB, CKMBINDEX, TROPONINI in the last 168 hours. BNP (last 3 results) No results for input(s): PROBNP in the last 8760 hours. HbA1C: No results for input(s): HGBA1C in the last 72 hours. CBG: Recent Labs  Lab 12/20/17 1634 12/20/17 2108 12/21/17 0444 12/21/17 0746 12/21/17 1227  GLUCAP 93 128* 177* 110* 126*   Lipid Profile: No results for input(s): CHOL, HDL, LDLCALC, TRIG, CHOLHDL, LDLDIRECT in the last 72 hours. Thyroid Function Tests: No results for input(s): TSH, T4TOTAL, FREET4, T3FREE, THYROIDAB in the last 72 hours. Anemia Panel: No results for input(s): VITAMINB12, FOLATE, FERRITIN, TIBC, IRON, RETICCTPCT in the last 72 hours. Sepsis Labs: No results for input(s): PROCALCITON, LATICACIDVEN in the last 168 hours.  Recent Results (from the past 240 hour(s))  Culture, blood (routine x 2)     Status: None (Preliminary result)   Collection Time: 12/19/17  6:50 PM  Result Value Ref Range Status   Specimen Description   Final    BLOOD LEFT ANTECUBITAL BOTTLES DRAWN AEROBIC AND ANAEROBIC Blood Culture adequate volume Performed at [facility] [address], [place], [place] [address]    Special Requests   Final    NONE Performed at [facility], [place] [address], [place], [place] [address]    Culture   Final    NO GROWTH 2 DAYS Performed at [facility] [address], [place], [place] [address]    Report Status PENDING  Incomplete  Culture, blood (routine x 2)     Status: None (Preliminary result)   Collection Time: 12/19/17  6:59 PM  Result Value Ref Range Status   Specimen Description   Final    BLOOD RIGHT ANTECUBITAL BOTTLES DRAWN AEROBIC ONLY Blood Culture adequate volume Performed at [facility] [address], [place], [place] [address]    Special Requests   Final    NONE Performed at [facility], [place] [address], [place], [place] [address]    Culture   Final    NO GROWTH 2 DAYS Performed at [facility] [address], [place], [place] [address]    Report Status PENDING  Incomplete         Radiology Studies: Dg Abd Acute W/chest  Result Date: 12/19/2017 CLINICAL DATA:  Upper abdominal pain with vomiting and diarrhea. EXAM: DG ABDOMEN ACUTE W/ 1V CHEST COMPARISON:  CT scan from yesterday. FINDINGS: The lungs are clear without focal pneumonia, edema, pneumothorax or pleural effusion. The cardiopericardial silhouette is within normal limits for size. The visualized bony structures of the thorax are intact. Upright film shows no evidence for intraperitoneal free air. No gaseous bowel dilatation to suggest obstruction. Moderate air-fluid level noted in the stomach. Bladder is opacified with contrast, consistent with CT scan yesterday. IMPRESSION: 1. No acute cardiopulmonary process. 2. No evidence for bowel obstruction  or perforation. Electronically Signed   By: Misty Stanley M.D.   On: 12/19/2017 20:17        Scheduled Meds: . capsaicin   Topical BID  . citalopram  20 mg Oral Daily  . dicyclomine  20 mg Oral BID  . enoxaparin (LOVENOX) injection  40 mg Subcutaneous Daily  . feeding supplement  1 Container Oral BID BM  . ferrous sulfate  325 mg Oral BID WC  . hyoscyamine  0.5 mg Intravenous Once  . insulin aspart  0-9 Units Subcutaneous Q4H  . insulin glargine  10 Units Subcutaneous Daily  . metoCLOPramide (REGLAN) injection  10 mg Intravenous Q6H  . mirtazapine  15 mg Oral QHS  . multivitamin with minerals  1 tablet Oral Daily  . pantoprazole  40 mg Oral Daily  . sucralfate  1 g Oral TID WC & HS   Continuous Infusions: . sodium chloride 100 mL/hr at 12/21/17 0512  . ciprofloxacin 400 mg (12/21/17 0809)  . metronidazole 500 mg (12/21/17 1258)     LOS:  0 days     Cordelia Poche, MD [facility] 12/21/2017, 3:08 PM Pager: [phone]  If 7PM-7AM, please contact night-coverage [email or web address] 12/21/2017, 3:08 PM

## 2017-12-21 NOTE — Progress Notes (Signed)
Pt still c/o of pain 10/10, c/o of nausea, no emesis present. Pt asking to shower bc states relieves her pain. Pt stated she feels like she is having a panic attack and asked to shower, pt ok to shower per this RN. Tele-sitter informed pt ok to shower and can call if she has been in shower longer than 30 min. At this time d/c tele-sitter and allowing pt to shower.

## 2017-12-21 NOTE — Plan of Care (Signed)
  Problem: Nutrition: Goal: Adequate nutrition will be maintained Outcome: Progressing   Problem: Elimination: Goal: Will not experience complications related to urinary retention Outcome: Progressing   Problem: Safety: Goal: Ability to remain free from injury will improve Outcome: Progressing   Problem: Skin Integrity: Goal: Risk for impaired skin integrity will decrease Outcome: Progressing   

## 2017-12-21 NOTE — Progress Notes (Addendum)
Initial Nutrition Assessment  DOCUMENTATION CODES:   (Will assess for malnutrition at follow-up.)  INTERVENTION:  - Will order Boost Breeze BID, each supplement provides 250 kcal and 9 grams of protein. - Will order daily multivitamin with minerals.  - Will order for patient to be weighed today.    NUTRITION DIAGNOSIS:   Inadequate oral intake related to acute illness, nausea, vomiting, poor appetite as evidenced by per patient/family report, meal completion < 25%.  GOAL:   Patient will meet greater than or equal to 90% of their needs  MONITOR:   PO intake, Supplement acceptance, Diet advancement, Weight trends, Labs, I & O's  REASON FOR ASSESSMENT:   Malnutrition Screening Tool  ASSESSMENT:   27 y.o. female with medical history significant of diabetes mellitus type 1, diabetic gastroparesis and cyclic vomiting syndrome.. Admitted for N/V thought to be 2/2 gastroparesis.   BMI indicates normal weight. Patient on CLD since 9/21 but flow sheet indicates patient has been refusing meals. RN notes state patient is refusing food/drinks and PO medications. Will order Boost Breeze but unsure if patient will accept/consume supplement.   Unable to obtain much nutrition-related information this visit. Patient reports ongoing N/V with last episode being earlier this AM. RN notes have indicated that most episodes of emesis are clear fluid. Patient reports ongoing severe pain. Unable to perform NFPE at this time.   Patient was last seen by a RD during hospitalization in April for N/V. At that time, patient was found to meet criteria for moderate malnutrition. At that time she informed RD that she usually had a very good appetite except during periods of N/V during which she was unable to eat. During that admission, patient weighed 140 lb. Current weight is 130 lb, but appears to be a stated weight.   Per Dr. Dennison Nancy note yesterday afternoon: patient with intractable N/V thought to be 2/2  gastroparesis, patient found to be inducing vomiting night of 9/21, question cannabis-related and plan for UDS (results 9/23 show positive for opiates and THC), abdominal pain with CT scan showing mild colitis.    Medications reviewed; 325 mg ferrous sulfate BID, sliding scale Novolog, 10 units Lantus/day, 10 mg IV Reglan QID, 1 g Carafate TID.   Labs reviewed; CBGs: 177, 110, and 126 mg/dL today, Ca: 8.7 mg/dL, K: 3.1 mmol/L. IVF; NS @ 100 mL/hr    NUTRITION - FOCUSED PHYSICAL EXAM:  Will attempt at follow-up.   Diet Order:   Diet Order            Diet clear liquid Room service appropriate? Yes; Fluid consistency: Thin  Diet effective now              EDUCATION NEEDS:   Not appropriate for education at this time  Skin:  Skin Assessment: Reviewed RN Assessment  Last BM:  PTA/unknown  Height:   Ht Readings from Last 1 Encounters:  12/18/17 5\' 7"  (1.702 m)    Weight:   Wt Readings from Last 1 Encounters:  12/18/17 59 kg    Ideal Body Weight:  61.36 kg  BMI:  Body mass index is 20.36 kg/m.  Estimated Nutritional Needs:   Kcal:  1655-1890 (28-32 kcal/kg)  Protein:  75-85 grams  Fluid:  >/= 1.8 L/day     Trenton Gammon, MS, RD, LDN, [facility] Inpatient Clinical Dietitian Pager # [phone] After hours/weekend pager # [phone]

## 2017-12-21 NOTE — Progress Notes (Signed)
Tele sitter d/c-ed. Pt stable, will continue to monitor.

## 2017-12-22 ENCOUNTER — Encounter (HOSPITAL_COMMUNITY): Payer: Self-pay

## 2017-12-22 ENCOUNTER — Ambulatory Visit: Payer: Self-pay | Admitting: Gastroenterology

## 2017-12-22 LAB — GLUCOSE, CAPILLARY
Glucose-Capillary: 104 mg/dL — ABNORMAL HIGH (ref 70–99)
Glucose-Capillary: 115 mg/dL — ABNORMAL HIGH (ref 70–99)
Glucose-Capillary: 51 mg/dL — ABNORMAL LOW (ref 70–99)
Glucose-Capillary: 56 mg/dL — ABNORMAL LOW (ref 70–99)
Glucose-Capillary: 56 mg/dL — ABNORMAL LOW (ref 70–99)
Glucose-Capillary: 72 mg/dL (ref 70–99)
Glucose-Capillary: 77 mg/dL (ref 70–99)
Glucose-Capillary: 81 mg/dL (ref 70–99)
Glucose-Capillary: 85 mg/dL (ref 70–99)
Glucose-Capillary: 98 mg/dL (ref 70–99)

## 2017-12-22 LAB — PREGNANCY, URINE: Preg Test, Ur: NEGATIVE

## 2017-12-22 MED ORDER — DEXTROSE 50 % IV SOLN
INTRAVENOUS | Status: AC
  Administered 2017-12-22: 25 mL via INTRAVENOUS
  Filled 2017-12-22: qty 50

## 2017-12-22 MED ORDER — ACETAMINOPHEN 650 MG RE SUPP: 975.0000 mg | Freq: Four times a day (QID) | RECTAL | Status: DC

## 2017-12-22 MED ORDER — INFLUENZA VAC SPLIT QUAD 0.5 ML IM SUSY
0.5000 mL | PREFILLED_SYRINGE | INTRAMUSCULAR | Status: AC
  Administered 2017-12-25: 0.5 mL via INTRAMUSCULAR
  Filled 2017-12-22: qty 0.5

## 2017-12-22 MED ORDER — SODIUM CHLORIDE 0.9% FLUSH
10.0000 mL | INTRAVENOUS | Status: DC | PRN
  Administered 2017-12-25: 20 mL
  Filled 2017-12-22: qty 40

## 2017-12-22 MED ORDER — PNEUMOCOCCAL VAC POLYVALENT 25 MCG/0.5ML IJ INJ
0.5000 mL | INJECTION | INTRAMUSCULAR | Status: AC
  Administered 2017-12-25: 0.5 mL via INTRAMUSCULAR
  Filled 2017-12-22: qty 0.5

## 2017-12-22 MED ORDER — DEXTROSE 50 % IV SOLN
25.0000 mL | Freq: Once | INTRAVENOUS | Status: AC
  Administered 2017-12-22: 25 mL via INTRAVENOUS

## 2017-12-22 MED ORDER — ACETAMINOPHEN 500 MG PO TABS
1000.0000 mg | ORAL_TABLET | Freq: Four times a day (QID) | ORAL | Status: DC
  Administered 2017-12-24 – 2017-12-25 (×2): 1000 mg via ORAL
  Filled 2017-12-22 (×5): qty 2

## 2017-12-22 NOTE — Progress Notes (Signed)
PROGRESS NOTE    RAEVEN [name]  LID:[id number] DOB: 07/13/1990 DOA: 12/18/2017 PCP: Patient, No Pcp Per   Brief Narrative: [name] is a 27 y.o. female with medical history significant of diabetes mellitus type 1, diabetic gastroparesis and cyclic vomiting syndrome.   Assessment & Plan:   Principal Problem:   Intractable nausea and vomiting Active Problems:   Gastroparesis   Diabetes mellitus type 1 (HCC)   Intractable nausea and vomiting Thought secondary to gastroparesis. Overnight, per report, patient found inducing vomiting. Symptoms also relieved with warm showers and patient has been taking multiple. Question cannabis  hyperemesis syndrome. Patient denies substance use. Recent EGD unremarkable except for findings consistent with gastroparesis. -Continue Reglan -Phenergan PO, Zofran PO/IV if Phenergan not working -[employer]. Patient encouraged to try eating  Abdominal pain Unknown etiology. Patient with mild colitis on CT scan. Epigastric. No bloody bowel movements. Still with significant abdominal pain limiting function. -Percocet prn, capsaicin cream -Morphine for breakthrough only -Continue cipro/flagyl -GI cocktail -CTA abdomen/pelvis pending negative urine pregnancy  Diabetes mellitus, type 1 No evidence of DKA. No hypoglycemia. -Continue Lantus -Continue SSI q4 hours while not taking PO well  Hypertensive urgency On chart review, patient has a history of significant hypertension. Improved. -Continue lisinopril 10 mg daily   DVT prophylaxis: Lovenox Code Status:   Code Status: Full Code Family Communication: None at bedside Disposition Plan: Discharge pending clinical improvement   Consultants:   None  Procedures:   None  Antimicrobials:  Ciprofloxacin  Flagyl    Subjective: Abdominal pain persistent. Vomiting yesterday four times per patient.  Objective: Vitals:   12/22/17 0438 12/22/17 0640 12/22/17 0646 12/22/17 0940  BP:  (!) 169/107 (!) 156/104 (!) 164/98 (!) 140/92  Pulse: (!) 106 (!) 107  (!) 102  Resp: 18   19  Temp: 98.5 F (36.9 C)   98.9 F (37.2 C)  TempSrc: Oral   Oral  SpO2: 100%   100%  Weight:      Height:        Intake/Output Summary (Last 24 hours) at 12/22/2017 1255 Last data filed at 12/22/2017 1136 Gross per 24 hour  Intake 3854.8 ml  Output 300 ml  Net 3554.8 ml   Filed Weights   12/18/17 1928 12/21/17 1341  Weight: 59 kg 59.6 kg    Examination:  General exam: Appears calm and comfortable Respiratory system: Clear to auscultation. Respiratory effort normal. Cardiovascular system: S1 & S2 heard, RRR. No murmurs. Gastrointestinal system: Abdomen is nondistended, soft and no significant tenderness. No organomegaly or masses felt. Normal bowel sounds heard. Central nervous system: Alert and oriented. No focal neurological deficits. Extremities: No edema. No calf tenderness Skin: No cyanosis. No rashes Psychiatry: Judgement and insight appear normal. Mood & affect appropriate.    Data Reviewed: I have personally reviewed following labs and imaging studies  CBC: Recent Labs  Lab 12/17/17 0610 12/18/17 1636 12/19/17 0501 12/20/17 0852  WBC 5.0 9.0 7.2 5.7  NEUTROABS 1.8  --   --   --   HGB 11.5* 11.6* 12.4 10.9*  HCT 33.6* 33.7* 35.6* 31.7*  MCV 85.3 83.8 83.2 85.2  PLT 274 259 253 887   Basic Metabolic Panel: Recent Labs  Lab 12/17/17 0610 12/18/17 1636 12/19/17 0501 12/20/17 0852 12/21/17 0511  NA 139 140 142 139 138  K 4.0 4.0 4.1 3.2* 3.1*  CL 99 101 102 101 99  CO2 31 25 22 22 25   GLUCOSE 96 305* 272* 219* 184*  BUN 20 21* 19 27* 19  CREATININE 0.82 0.59 0.65 0.67 0.54  CALCIUM 9.5 9.4 9.3 8.9 8.7*   GFR: Estimated Creatinine Clearance: 98.5 mL/min (by C-G formula based on SCr of 0.54 mg/dL). Liver Function Tests: Recent Labs  Lab 12/17/17 0610 12/18/17 1636  AST 27 25  ALT 27 25  ALKPHOS 84 84  BILITOT 0.6 0.8  PROT 7.9 8.2*  ALBUMIN 3.4*  3.9   Recent Labs  Lab 12/17/17 0610 12/18/17 1636  LIPASE 20 18   No results for input(s): AMMONIA in the last 168 hours. Coagulation Profile: No results for input(s): INR, PROTIME in the last 168 hours. Cardiac Enzymes: No results for input(s): CKTOTAL, CKMB, CKMBINDEX, TROPONINI in the last 168 hours. BNP (last 3 results) No results for input(s): PROBNP in the last 8760 hours. HbA1C: No results for input(s): HGBA1C in the last 72 hours. CBG: Recent Labs  Lab 12/21/17 1645 12/21/17 1956 12/22/17 0037 12/22/17 0436 12/22/17 0927  GLUCAP 97 79 85 81 115*   Lipid Profile: No results for input(s): CHOL, HDL, LDLCALC, TRIG, CHOLHDL, LDLDIRECT in the last 72 hours. Thyroid Function Tests: No results for input(s): TSH, T4TOTAL, FREET4, T3FREE, THYROIDAB in the last 72 hours. Anemia Panel: No results for input(s): VITAMINB12, FOLATE, FERRITIN, TIBC, IRON, RETICCTPCT in the last 72 hours. Sepsis Labs: No results for input(s): PROCALCITON, LATICACIDVEN in the last 168 hours.  Recent Results (from the past 240 hour(s))  Culture, blood (routine x 2)     Status: None (Preliminary result)   Collection Time: 12/19/17  6:50 PM  Result Value Ref Range Status   Specimen Description   Final    BLOOD LEFT ANTECUBITAL BOTTLES DRAWN AEROBIC AND ANAEROBIC Blood Culture adequate volume Performed at [place] [address] [place], [place] [address]    Special Requests   Final    NONE Performed at [facility], [place] [address], [place], [place] [address]    Culture   Final    NO GROWTH 3 DAYS Performed at [facility], [place] [address], [place], [place] [address]    Report Status PENDING  Incomplete  Culture, blood (routine x 2)     Status: None (Preliminary result)   Collection Time: 12/19/17  6:59 PM  Result Value Ref Range Status   Specimen Description   Final    BLOOD RIGHT ANTECUBITAL BOTTLES DRAWN AEROBIC ONLY Blood Culture  adequate volume Performed at [place] [address], [place], [place] [address]    Special Requests   Final    NONE Performed at [facility], [place] [address], [place], [place] [address]    Culture   Final    NO GROWTH 3 DAYS Performed at [facility], [place] [address] [place], [place] [address]    Report Status PENDING  Incomplete         Radiology Studies: No results found.      Scheduled Meds: . capsaicin   Topical BID  . citalopram  20 mg Oral Daily  . dicyclomine  20 mg Oral BID  . enoxaparin (LOVENOX) injection  40 mg Subcutaneous Daily  . feeding supplement  1 Container Oral BID BM  . ferrous sulfate  325 mg Oral BID WC  . hyoscyamine  0.5 mg Intravenous Once  . [START ON 12/23/2017] Influenza vac split quadrivalent PF  0.5 mL Intramuscular Tomorrow-1000  . insulin aspart  0-9 Units Subcutaneous Q4H  .  insulin glargine  10 Units Subcutaneous Daily  . losartan  25 mg Oral Daily  . metoCLOPramide (REGLAN) injection  10 mg Intravenous Q6H  . mirtazapine  15 mg Oral QHS  . multivitamin with minerals  1 tablet Oral Daily  . pantoprazole  40 mg Oral Daily  . [START ON 12/23/2017] pneumococcal 23 valent vaccine  0.5 mL Intramuscular Tomorrow-1000  . sucralfate  1 g Oral TID WC & HS   Continuous Infusions: . sodium chloride 100 mL/hr at 12/22/17 0613  . ciprofloxacin 400 mg (12/22/17 0804)  . metronidazole 500 mg (12/22/17 1231)     LOS: 1 day     Cordelia Poche, MD [facility] 12/22/2017, 12:55 PM Pager: [phone]  If 7PM-7AM, please contact night-coverage [email or web address] 12/22/2017, 12:55 PM

## 2017-12-22 NOTE — Plan of Care (Signed)
  Problem: Pain Managment: Goal: General experience of comfort will improve Outcome: Not Progressing  Patient pain score remains at 10

## 2017-12-22 NOTE — Progress Notes (Signed)
CBG 56.  Cup of OJ given. CBG 56 again.  More OJ given.  CBG 51.  D50 70mL push given.  Cbg 96.  No complaints noted by patient. VSS. Will continue to monitor

## 2017-12-23 ENCOUNTER — Inpatient Hospital Stay (HOSPITAL_COMMUNITY): Payer: 59

## 2017-12-23 ENCOUNTER — Encounter (HOSPITAL_COMMUNITY): Payer: Self-pay | Admitting: Radiology

## 2017-12-23 DIAGNOSIS — R109 Unspecified abdominal pain: Secondary | ICD-10-CM

## 2017-12-23 DIAGNOSIS — E108 Type 1 diabetes mellitus with unspecified complications: Secondary | ICD-10-CM

## 2017-12-23 LAB — BASIC METABOLIC PANEL
Anion gap: 12 (ref 5–15)
BUN: 8 mg/dL (ref 6–20)
CO2: 29 mmol/L (ref 22–32)
Calcium: 8.7 mg/dL — ABNORMAL LOW (ref 8.9–10.3)
Chloride: 104 mmol/L (ref 98–111)
Creatinine, Ser: 0.49 mg/dL (ref 0.44–1.00)
GFR calc Af Amer: >60 mL/min (ref >60)
GFR calc non Af Amer: >60 mL/min (ref >60)
Glucose, Bld: 108 mg/dL — ABNORMAL HIGH (ref 70–99)
Potassium: 2.3 mmol/L — CL (ref 3.5–5.1)
Sodium: 145 mmol/L (ref 135–145)

## 2017-12-23 LAB — GLUCOSE, CAPILLARY
Glucose-Capillary: 101 mg/dL — ABNORMAL HIGH (ref 70–99)
Glucose-Capillary: 119 mg/dL — ABNORMAL HIGH (ref 70–99)
Glucose-Capillary: 124 mg/dL — ABNORMAL HIGH (ref 70–99)
Glucose-Capillary: 126 mg/dL — ABNORMAL HIGH (ref 70–99)
Glucose-Capillary: 127 mg/dL — ABNORMAL HIGH (ref 70–99)
Glucose-Capillary: 132 mg/dL — ABNORMAL HIGH (ref 70–99)
Glucose-Capillary: 156 mg/dL — ABNORMAL HIGH (ref 70–99)

## 2017-12-23 LAB — MAGNESIUM: Magnesium: 1.8 mg/dL (ref 1.7–2.4)

## 2017-12-23 LAB — POTASSIUM: Potassium: 2.5 mmol/L — CL (ref 3.5–5.1)

## 2017-12-23 IMAGING — CT CT CTA ABD/PEL W/CM AND/OR W/O CM
2 of 9 series · 12 of 46 positions shown, 17 images · IV contrast (iopamidol)
Comparison: [DATE] and previous

CLINICAL DATA: diabetes mellitus type 1, diabetic gastroparesis and
cyclic vomiting syndrome.Pt. C/o diffuse abdominal pain and back
pain x's 1 week, nausea,vomiting, diarrheaNegative Urine pregnancy
test

EXAM:
CTA ABDOMEN AND PELVIS WITH CONTRAST
TECHNIQUE: Multidetector CT imaging of the abdomen and pelvis was performed
using the standard protocol during bolus administration of
intravenous contrast. Multiplanar reconstructed images and MIPs were
obtained and reviewed to evaluate the vascular anatomy.
CONTRAST:  100mL [6V] IOPAMIDOL ([6V]) INJECTION 76%

[Series 6: axial venous · axial · portal-venous · 0.66mm/px · z∈[-535,-143]mm · 10 of 232 slices shown, 15 images]
[im 18/232  soft-tissue]
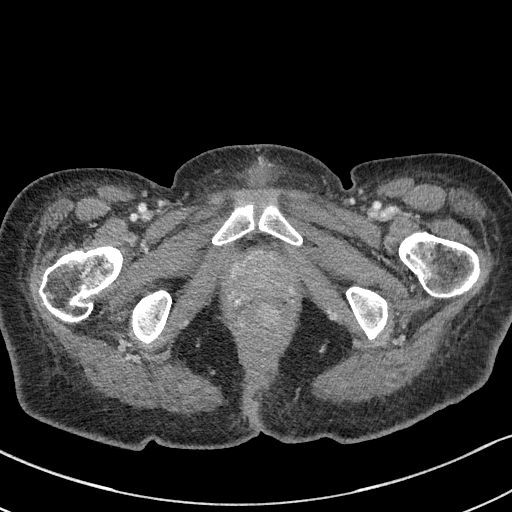
[im 18/232  bone]
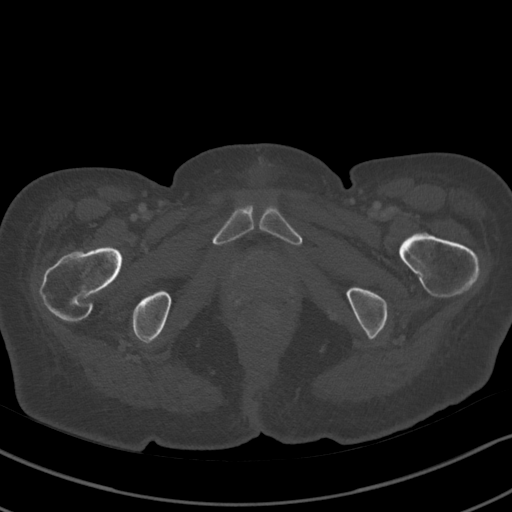
[im 54/232  soft-tissue]
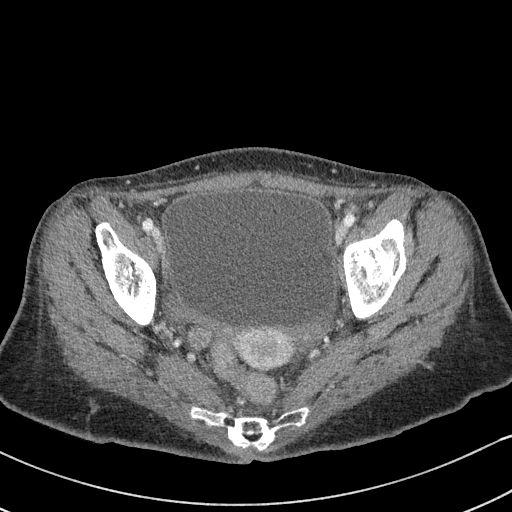
[im 72/232  soft-tissue]
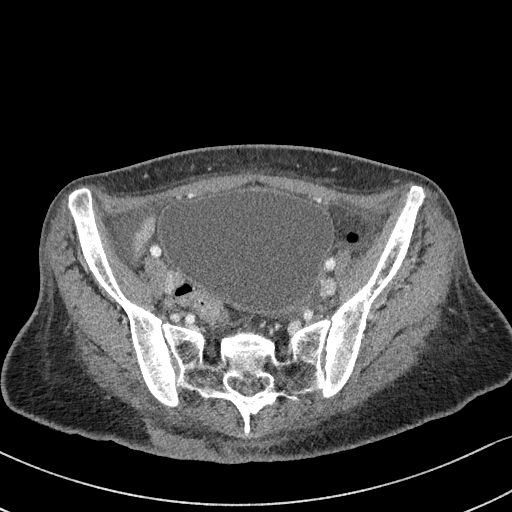
[im 89/232  soft-tissue]
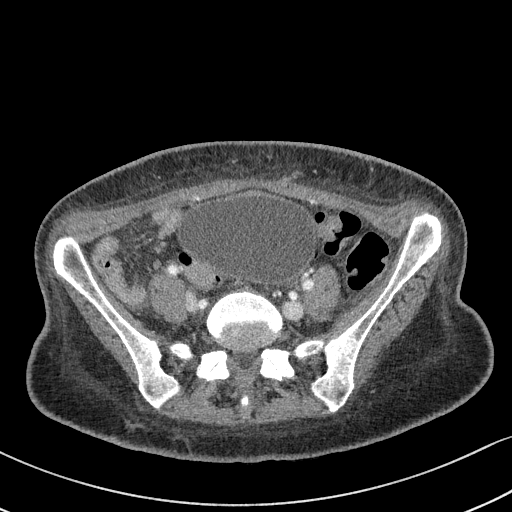
[im 125/232  soft-tissue]
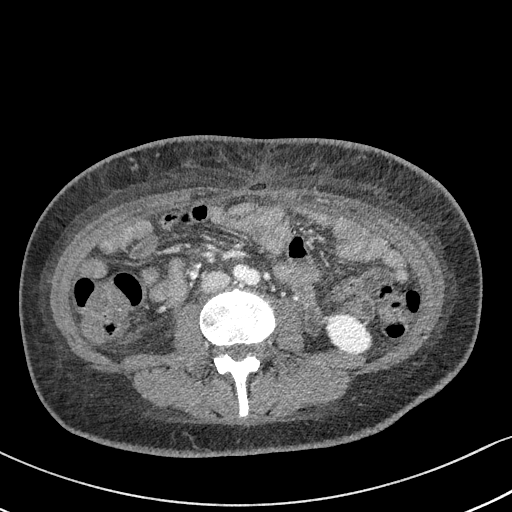
[im 143/232  soft-tissue]
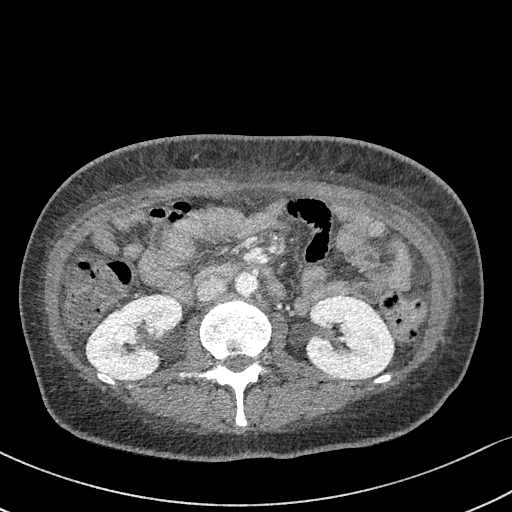
[im 160/232  soft-tissue]
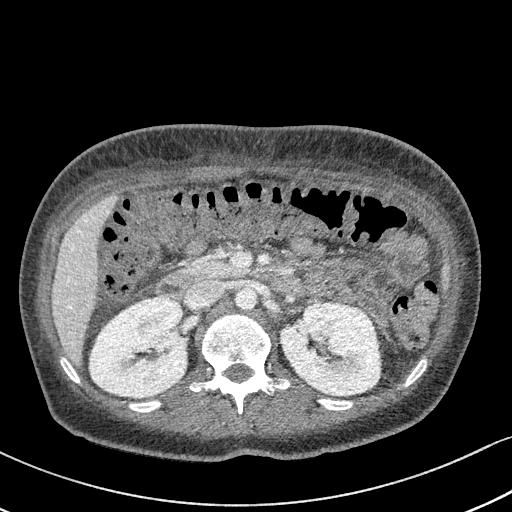
[im 160/232  lung]
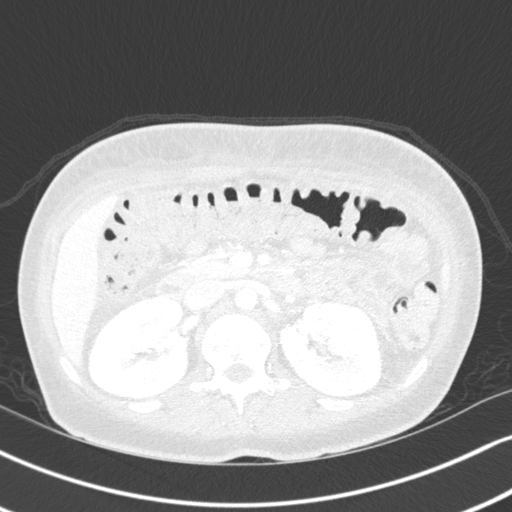
[im 178/232  lung]
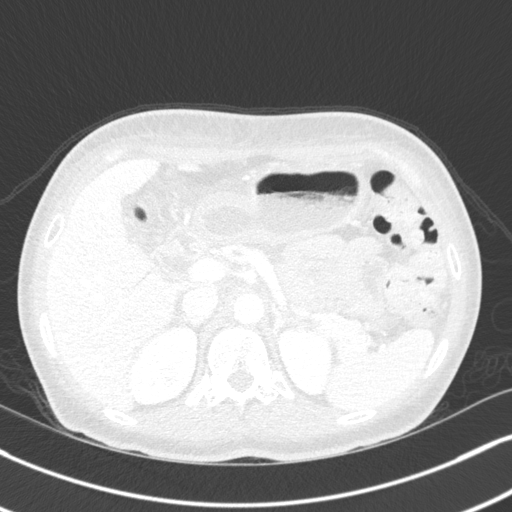
[im 196/232  soft-tissue]
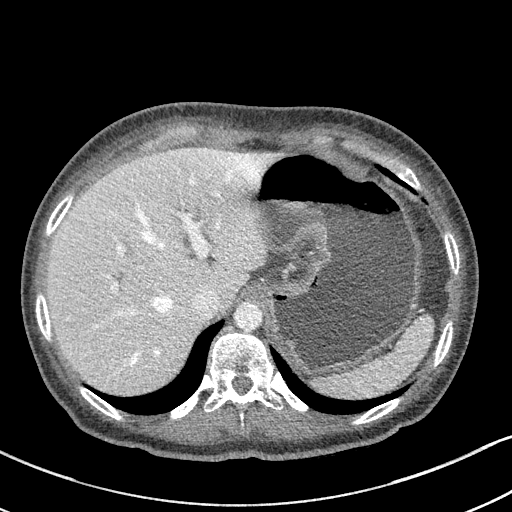
[im 196/232  lung]
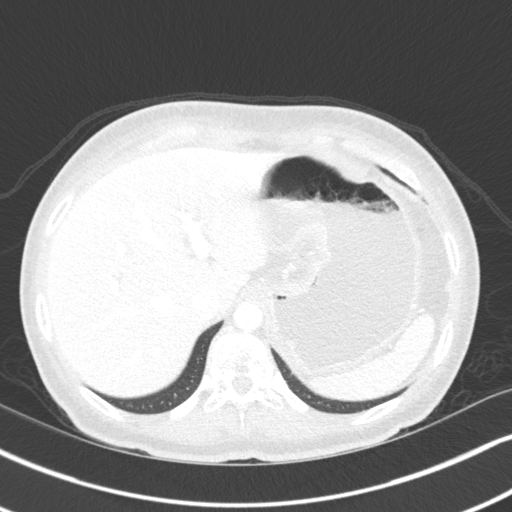
[im 214/232  soft-tissue]
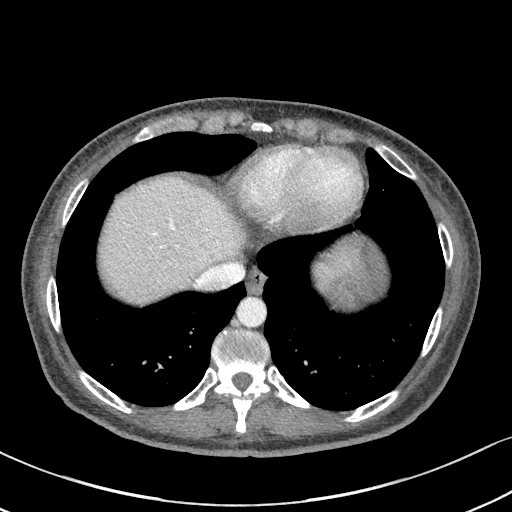
[im 214/232  lung]
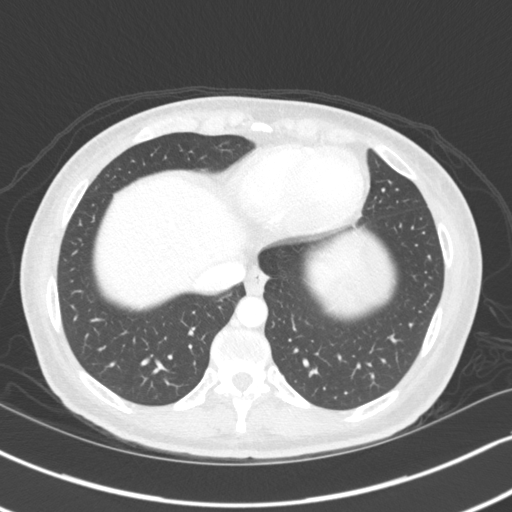
[im 214/232  bone]
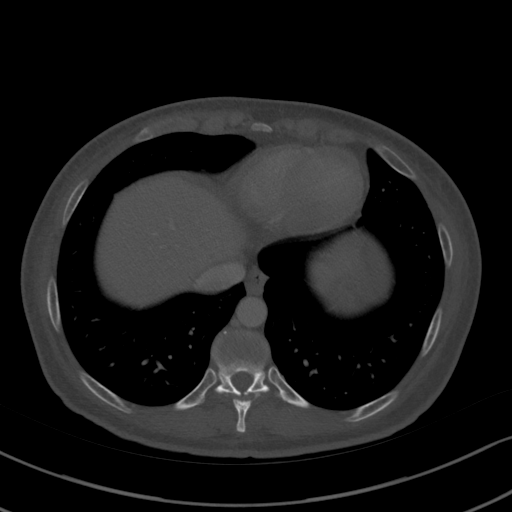

[Series 10: coronal mpr · coronal · 0.77mm/px · 2 of 129 slices shown]
[im 43/129  soft-tissue]
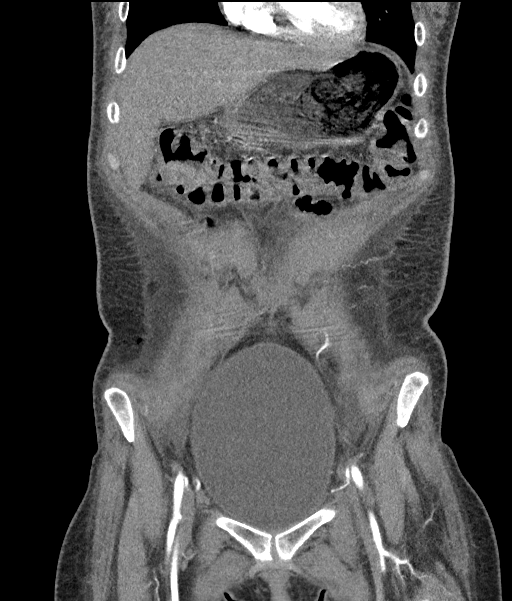
[im 86/129  soft-tissue]
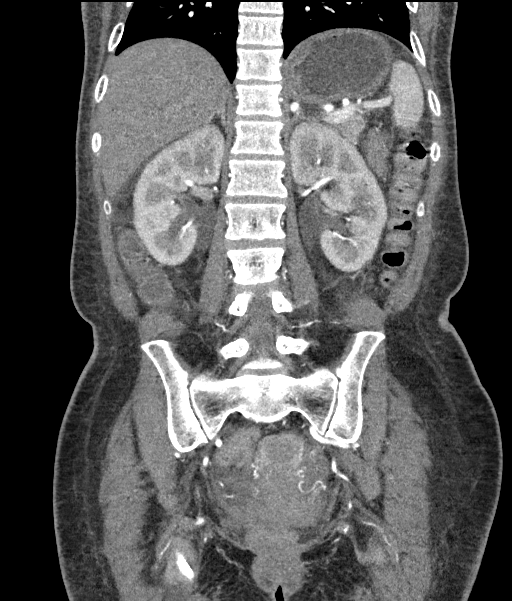

[12 of 46 positions shown; findings below may reference images not displayed]

FINDINGS: VASCULAR

Aorta: Normal caliber aorta without aneurysm, dissection, vasculitis
or significant stenosis.

Celiac: Patent without evidence of aneurysm, dissection, vasculitis
or significant stenosis.

SMA: Patent without evidence of aneurysm, dissection, vasculitis or
significant stenosis.

Renals: Both renal arteries are patent without evidence of aneurysm,
dissection, vasculitis, fibromuscular dysplasia or significant
stenosis.

IMA: Patent without evidence of aneurysm, dissection, vasculitis or
significant stenosis.

Inflow: Patent without evidence of aneurysm, dissection, vasculitis
or significant stenosis.

Proximal Outflow: Bilateral common femoral and visualized portions
of the superficial and profunda femoral arteries are patent without
evidence of aneurysm, dissection, vasculitis or significant
stenosis.

Veins: Patent hepatic veins, portal vein, SMV, splenic vein,
bilateral renal veins, IVC and iliac venous system. No venous
pathology identified.

Review of the MIP images confirms the above findings.

NON-VASCULAR

Lower chest: No acute abnormality.

Hepatobiliary: No focal liver abnormality is seen. Status post
cholecystectomy. No biliary dilatation.

Pancreas: Mild diffuse atrophy and ductal prominence, without focal
lesion.

Spleen: Normal in size without focal abnormality.

Adrenals/Urinary Tract: Adrenal glands are unremarkable. Kidneys are
normal, without renal calculi, focal lesion, or hydronephrosis.
Bladder is distended.

Stomach/Bowel: Stomach is distended by ingested material. Small
bowel is nondilated. Normal appendix. Colon is nondilated. There is
suggestion of mild wall thickening in this hepatic flexure,
transverse segment to the splenic flexure. Descending segment,
sigmoid and rectum unremarkable.

Lymphatic: No abdominal or pelvic adenopathy.

Reproductive: Uterus and bilateral adnexa are unremarkable.

Other: Trace pelvic ascites, which can be physiologic in a
menstruating females. No free air.

Musculoskeletal: Increase in diffuse mild body wall subcutaneous and
mesenteric edema. No fracture or worrisome bone lesion.
IMPRESSION: VASCULAR

1. No Significant abnormalities.

NON-VASCULAR

1. Persistent mild wall thickening suspected in the transverse
colon.
2. Marked distention of the urinary bladder.
3. New body wall and mesenteric edema.

## 2017-12-23 MED ORDER — IOPAMIDOL (ISOVUE-370) INJECTION 76%
INTRAVENOUS | Status: AC
  Filled 2017-12-23: qty 100

## 2017-12-23 MED ORDER — POTASSIUM CHLORIDE CRYS ER 20 MEQ PO TBCR
40.0000 meq | EXTENDED_RELEASE_TABLET | Freq: Once | ORAL | Status: DC
  Filled 2017-12-23: qty 2

## 2017-12-23 MED ORDER — PROMETHAZINE HCL 25 MG RE SUPP
25.0000 mg | Freq: Four times a day (QID) | RECTAL | Status: DC | PRN
  Administered 2017-12-24: 25 mg via RECTAL
  Filled 2017-12-23: qty 1

## 2017-12-23 MED ORDER — POTASSIUM CHLORIDE 10 MEQ/100ML IV SOLN
INTRAVENOUS | Status: AC
  Administered 2017-12-23: 10 meq via INTRAVENOUS
  Filled 2017-12-23: qty 100

## 2017-12-23 MED ORDER — HYOSCYAMINE SULFATE 0.5 MG/ML IJ SOLN
0.5000 mg | Freq: Four times a day (QID) | INTRAMUSCULAR | Status: DC | PRN
  Filled 2017-12-23: qty 1

## 2017-12-23 MED ORDER — POTASSIUM CHLORIDE 10 MEQ/100ML IV SOLN
10.0000 meq | INTRAVENOUS | Status: AC
  Administered 2017-12-23 (×5): 10 meq via INTRAVENOUS
  Filled 2017-12-23 (×4): qty 100

## 2017-12-23 MED ORDER — POTASSIUM CHLORIDE IN NACL 40-0.9 MEQ/L-% IV SOLN
INTRAVENOUS | Status: DC
  Administered 2017-12-23 – 2017-12-24 (×3): 75 mL/h via INTRAVENOUS
  Filled 2017-12-23 (×4): qty 1000

## 2017-12-23 MED ORDER — HYOSCYAMINE SULFATE 0.125 MG PO TBDP
0.5000 mg | ORAL_TABLET | Freq: Four times a day (QID) | ORAL | Status: DC | PRN
  Administered 2017-12-23: 0.5 mg via SUBLINGUAL
  Filled 2017-12-23 (×2): qty 4

## 2017-12-23 MED ORDER — LOSARTAN POTASSIUM 50 MG PO TABS: 50.0000 mg | ORAL_TABLET | Freq: Every day | ORAL | Status: DC

## 2017-12-23 MED ORDER — PANTOPRAZOLE SODIUM 40 MG IV SOLR
40.0000 mg | Freq: Two times a day (BID) | INTRAVENOUS | Status: DC
  Administered 2017-12-23 – 2017-12-25 (×5): 40 mg via INTRAVENOUS
  Filled 2017-12-23 (×5): qty 40

## 2017-12-23 MED ORDER — HYOSCYAMINE SULFATE 0.125 MG SL SUBL
0.5000 mg | SUBLINGUAL_TABLET | Freq: Four times a day (QID) | SUBLINGUAL | Status: DC | PRN
  Administered 2017-12-23 – 2017-12-24 (×2): 0.5 mg via SUBLINGUAL
  Filled 2017-12-23 (×3): qty 4

## 2017-12-23 MED ORDER — IOPAMIDOL (ISOVUE-370) INJECTION 76%
100.0000 mL | Freq: Once | INTRAVENOUS | Status: AC | PRN
  Administered 2017-12-23: 100 mL via INTRAVENOUS

## 2017-12-23 MED ORDER — POTASSIUM CHLORIDE 10 MEQ/100ML IV SOLN
10.0000 meq | INTRAVENOUS | Status: AC
  Administered 2017-12-23 (×6): 10 meq via INTRAVENOUS
  Filled 2017-12-23 (×6): qty 100

## 2017-12-23 NOTE — Progress Notes (Signed)
CRITICAL VALUE ALERT  Critical Value:  Potassium 2.3  Date & Time Notied:  12/23/17@ 0419  Provider Notified: Yes  Orders Received/Actions taken: Awaiting response

## 2017-12-23 NOTE — Progress Notes (Signed)
Patient ID: [name], female   DOB: [birth year], 27 y.o.   MRN: [id number]  PROGRESS NOTE    TEODORA BAUMGARTEN  XBD:[id number] DOB: 1990-07-04 DOA: 12/18/2017 PCP: Patient, No Pcp Per   Brief Narrative:  27 year old female with history of diabetes mellitus type 1, diabetic gastroparesis and cyclical vomiting syndrome presented with nausea and vomiting on 12/18/2017 along with abdominal pain.  CT of the abdomen showed mild colitis.  Patient was started on IV antibiotics along with IV fluids and antiemetics.   Assessment & Plan:   Principal Problem:   Intractable nausea and vomiting Active Problems:   Gastroparesis   Diabetes mellitus type 1 (HCC)  Intractable nausea and vomiting most likely secondary to gastroparesis -There is also a possibility of cyclical vomiting syndrome from cannabis use -Recent EGD was unremarkable except for findings consistent with gastroparesis -Patient still nauseous and complains of vomiting.  Continue Reglan and Phenergan. -GI consulted for persistence of symptoms.  Advance diet as tolerated  Abdominal pain/mild colitis on CT scan -Still complains of severe abdominal pain.  Unclear if the patient also has pain medication seeking behavior  -has been on ciprofloxacin and Flagyl since admission with no improvement of symptoms.  CT angiogram of abdomen and pelvis is pending -GI consulted. -Continue Protonix/GI cocktail.  Continue pain management  Diabetes mellitus type 1 -Continue Lantus and sliding scale insulin coverage  Hypertensive urgency -Improving.  BP still elevated.  Increase losartan to 50 milligrams daily   DVT prophylaxis: Lovenox Code Status: Full  family Communication: None at bedside Disposition Plan: Home in 1 to 2 days if clinically improves  Consultants: GI  Procedures: None  Antimicrobials: Cipro and Flagyl from 12/19/2017 onwards   Subjective: Patient seen and examined at bedside.  She still complains of persistence of nausea  along with severe intermittent abdominal pain.  Her appetite is still poor.  No overnight fever.  Objective: Vitals:   12/22/17 0940 12/22/17 1443 12/22/17 2127 12/23/17 0535  BP: (!) 140/92 130/90 (!) 143/99 (!) 156/98  Pulse: (!) 102 (!) 103 (!) 102 (!) 103  Resp: 19 10 20 18   Temp: 98.9 F (37.2 C) 99 F (37.2 C) 98.3 F (36.8 C) 98.5 F (36.9 C)  TempSrc: Oral Oral Oral Oral  SpO2: 100% 98% 100% 98%  Weight:      Height:        Intake/Output Summary (Last 24 hours) at 12/23/2017 1252 Last data filed at 12/23/2017 1025 Gross per 24 hour  Intake 1229.93 ml  Output 1600 ml  Net -370.07 ml   Filed Weights   12/18/17 1928 12/21/17 1341  Weight: 59 kg 59.6 kg    Examination:  General exam: Appears in mild distress secondary to nausea and abdominal pain Respiratory system: Bilateral decreased breath sounds at bases Cardiovascular system: S1 & S2 heard, Rate controlled Gastrointestinal system: Abdomen is nondistended, soft and tender in epigastric/periumbilical regions. Normal bowel sounds heard. Extremities: No cyanosis, clubbing, edema   Data Reviewed: I have personally reviewed following labs and imaging studies  CBC: Recent Labs  Lab 12/17/17 0610 12/18/17 1636 12/19/17 0501 12/20/17 0852  WBC 5.0 9.0 7.2 5.7  NEUTROABS 1.8  --   --   --   HGB 11.5* 11.6* 12.4 10.9*  HCT 33.6* 33.7* 35.6* 31.7*  MCV 85.3 83.8 83.2 85.2  PLT 274 259 253 834   Basic Metabolic Panel: Recent Labs  Lab 12/18/17 1636 12/19/17 0501 12/20/17 0852 12/21/17 0511 12/23/17 0328  NA 140 142  139 138 145  K 4.0 4.1 3.2* 3.1* 2.3*  CL 101 102 101 99 104  CO2 25 22 22 25 29   GLUCOSE 305* 272* 219* 184* 108*  BUN 21* 19 27* 19 8  CREATININE 0.59 0.65 0.67 0.54 0.49  CALCIUM 9.4 9.3 8.9 8.7* 8.7*   GFR: Estimated Creatinine Clearance: 98.5 mL/min (by C-G formula based on SCr of 0.49 mg/dL). Liver Function Tests: Recent Labs  Lab 12/17/17 0610 12/18/17 1636  AST 27 25  ALT 27  25  ALKPHOS 84 84  BILITOT 0.6 0.8  PROT 7.9 8.2*  ALBUMIN 3.4* 3.9   Recent Labs  Lab 12/17/17 0610 12/18/17 1636  LIPASE 20 18   No results for input(s): AMMONIA in the last 168 hours. Coagulation Profile: No results for input(s): INR, PROTIME in the last 168 hours. Cardiac Enzymes: No results for input(s): CKTOTAL, CKMB, CKMBINDEX, TROPONINI in the last 168 hours. BNP (last 3 results) No results for input(s): PROBNP in the last 8760 hours. HbA1C: No results for input(s): HGBA1C in the last 72 hours. CBG: Recent Labs  Lab 12/22/17 2026 12/22/17 2324 12/23/17 0308 12/23/17 0749 12/23/17 1239  GLUCAP 72 104* 101* 126* 124*   Lipid Profile: No results for input(s): CHOL, HDL, LDLCALC, TRIG, CHOLHDL, LDLDIRECT in the last 72 hours. Thyroid Function Tests: No results for input(s): TSH, T4TOTAL, FREET4, T3FREE, THYROIDAB in the last 72 hours. Anemia Panel: No results for input(s): VITAMINB12, FOLATE, FERRITIN, TIBC, IRON, RETICCTPCT in the last 72 hours. Sepsis Labs: No results for input(s): PROCALCITON, LATICACIDVEN in the last 168 hours.  Recent Results (from the past 240 hour(s))  Culture, blood (routine x 2)     Status: None (Preliminary result)   Collection Time: 12/19/17  6:50 PM  Result Value Ref Range Status   Specimen Description BLOOD LEFT ANTECUBITAL  Final   Special Requests   Final    BOTTLES DRAWN AEROBIC AND ANAEROBIC Blood Culture adequate volume   Culture   Final    NO GROWTH 3 DAYS Performed at [facility], [address] [place], [place] [address]    Report Status PENDING  Incomplete  Culture, blood (routine x 2)     Status: None (Preliminary result)   Collection Time: 12/19/17  6:59 PM  Result Value Ref Range Status   Specimen Description BLOOD RIGHT ANTECUBITAL  Final   Special Requests   Final    BOTTLES DRAWN AEROBIC ONLY Blood Culture adequate volume   Culture   Final    NO GROWTH 3 DAYS Performed at [facility], [place] [address] [place], [place] [address]    Report Status PENDING  Incomplete         Radiology Studies: No results found.      Scheduled Meds: . acetaminophen  975 mg Rectal Q6H   Or  . acetaminophen  1,000 mg Oral Q6H  . capsaicin   Topical BID  . citalopram  20 mg Oral Daily  . dicyclomine  20 mg Oral BID  . enoxaparin (LOVENOX) injection  40 mg Subcutaneous Daily  . feeding supplement  1 Container Oral BID BM  . ferrous sulfate  325 mg Oral BID WC  . hyoscyamine  0.5 mg Intravenous Once  . Influenza vac split quadrivalent PF  0.5 mL Intramuscular Tomorrow-1000  . insulin aspart  0-9 Units Subcutaneous Q4H  . insulin glargine  10 Units Subcutaneous Daily  . iopamidol      . losartan  25 mg Oral Daily  . metoCLOPramide (REGLAN) injection  10 mg Intravenous Q6H  . mirtazapine  15 mg Oral QHS  . multivitamin with minerals  1 tablet Oral Daily  . pantoprazole (PROTONIX) IV  40 mg Intravenous Q12H  . pneumococcal 23 valent vaccine  0.5 mL Intramuscular Tomorrow-1000  . potassium chloride  40 mEq Oral Once  . potassium chloride  40 mEq Oral Once  . sucralfate  1 g Oral TID WC & HS   Continuous Infusions: . sodium chloride 100 mL/hr at 12/23/17 0836  . ciprofloxacin 400 mg (12/23/17 0725)  . metronidazole 500 mg (12/23/17 1125)     LOS: 2 days        Aline August, MD [facility] Pager [phone]  If 7PM-7AM, please contact night-coverage [email or web address] Password [facility] 12/23/2017, 12:52 PM

## 2017-12-23 NOTE — Consult Note (Signed)
Consultation  Referring Provider: Dr. Hanley Ben    Primary Care Physician:  Patient, No Pcp Per Primary Gastroenterologist: Dr. Christella Hartigan      Reason for Consultation: Gastroparesis with intractable nausea and vomiting            HPI:   [name] is a 27 y.o. female with a past medical history of diabetes and gastroparesis, hypertension, chronic diarrhea and perirectal/perianal abscess, who presented to the hospital on 12/18/2017 with a complaint of nausea and vomiting.  At time of admission this had been going on for 2 days and was persistent despite Zofran and Morphine in the ED.  She was admitted.    Today, is day 5 of patient's admission, she is found laying in bed crunched over in a circle.  She is tearful and telling me that she just "hurts so bad and nothing helps".  Apparently, tells me that warm showers were helping initially but now there is no relief from her constant nausea.  Anything she puts in her mouth comes back out including pills or even sips of water.  Patient tells me she was trying all of her medications at home including Zofran and Promethazine which were not helping.  Describes being told to stop Reglan as it was of no benefit for her in the past.  Pain is described in the epigastrium which radiates through her back, rated as a 7-8/10 and sometimes worse.    Normal bowel habits are loose and these continue.    Denies fever, chills or change in bowel habits.     GI history: 10/05/2017 EGD Dr. Adela Lank: Normal esophagus, medium amount of food residue in the stomach consistent with gastroparesis and normal stomach otherwise with no outlet obstruction ; it was discussed that domperidone was not an option for her as her QTC on multiple EKGs was greater than 450 10/02/2017 office visit Dr. Adela Lank: At that time was discussed she had prior gastric emptying study showing gastroparesis on her she had never completed an EGD and given her worsening symptoms she was arrange for repeat  EGD, it was recommended she stay on a liquid diet and she was placed on a trial of Protonix 40 mg once daily.  She is also scheduled Phenergan 12.5 mg every 8 hours and Zofran as needed, her chronic diarrhea was also discussed does recommend she increase her Imodium as needed and possible pancreatic elastase studies were discussed with the future CT scan 07/09/2017 -  R gluteal catheter in place, improved thickening CT scan 06/19/2017 - mild wall thickening throughout her colon, possible fistula CT scan 05/27/17 - possible perianal fistula Colonoscopy 06/27/2017 - normal colon and ileum - normal random biopsies Flex sig 03/10/2017 - poor prep, normal colon with normal biopsies EGD 12/03/2011 - not completed due to retained food  Past Medical History:  Diagnosis Date  . Allergy   . Anemia   . Anxiety   . Blood transfusion without reported diagnosis    Dec 2018  . Cataract    right eye  . Depression   . Diabetes type 1, uncontrolled (HCC) 11/14/2011   Since age 49  . Fibromyalgia   . Gastroparesis   . GERD (gastroesophageal reflux disease)   . Hypertension   . Infection    UTI April 2016    Past Surgical History:  Procedure Laterality Date  . ANKLE SURGERY    . CHOLECYSTECTOMY  11/15/2011   Procedure: LAPAROSCOPIC CHOLECYSTECTOMY WITH INTRAOPERATIVE CHOLANGIOGRAM;  Surgeon: Viviann Spare  C. Gross, MD;  Location: [facility];  Service: General;  Laterality: N/A;  . COLONOSCOPY    . COLONOSCOPY WITH PROPOFOL N/A 06/27/2017   Procedure: COLONOSCOPY WITH PROPOFOL;  Surgeon: Rachael Fee, MD;  Location: [facility];  Service: Endoscopy;  Laterality: N/A;  . ESOPHAGOGASTRODUODENOSCOPY  12/03/2011   Procedure: ESOPHAGOGASTRODUODENOSCOPY (EGD);  Surgeon: Theda Belfast, MD;  Location: [facility];  Service: Endoscopy;  Laterality: N/A;  . FLEXIBLE SIGMOIDOSCOPY N/A 03/10/2017   Procedure: FLEXIBLE SIGMOIDOSCOPY;  Surgeon: Jeani Hawking, MD;  Location: [facility];  Service: Endoscopy;  Laterality: N/A;   . INCISION AND DRAINAGE PERIRECTAL ABSCESS N/A 03/01/2017   Procedure: IRRIGATION AND DEBRIDEMENT PERIRECTAL ABSCESS;  Surgeon: Ovidio Kin, MD;  Location: [facility];  Service: General;  Laterality: N/A;  . IRRIGATION AND DEBRIDEMENT BUTTOCKS N/A 03/23/2017   Procedure: IRRIGATION AND DEBRIDEMENT BUTTOCKS, SETON PLACEMENT;  Surgeon: Romie Levee, MD;  Location: [facility];  Service: General;  Laterality: N/A;  . LAPAROSCOPY  11/23/2011   Procedure: LAPAROSCOPY DIAGNOSTIC;  Surgeon: Mariella Saa, MD;  Location: [facility];  Service: General;  Laterality: N/A;  . SIGMOIDOSCOPY    . UPPER GASTROINTESTINAL ENDOSCOPY    . WISDOM TOOTH EXTRACTION      Family History  Problem Relation Age of Onset  . Diabetes Mother   . Hypertension Father   . Colon cancer Paternal Grandmother        pt thinks PGM was dx in her 35's  . Esophageal cancer Neg Hx   . Liver cancer Neg Hx   . Pancreatic cancer Neg Hx   . Stomach cancer Neg Hx   . Rectal cancer Neg Hx     Social History   Tobacco Use  . Smoking status: Never Smoker  . Smokeless tobacco: Never Used  Substance Use Topics  . Alcohol use: No    Alcohol/week: 0.0 standard drinks    Frequency: Never  . Drug use: No    Prior to Admission medications   Medication Sig Start Date End Date Taking? Authorizing Provider  cholestyramine light (PREVALITE) 4 g packet Take 1 packet (4 g total) by mouth 3 (three) times daily. 03/14/17  Yes Rolly Salter, MD  citalopram (CELEXA) 20 MG tablet Take 1 tablet (20 mg total) by mouth daily. 07/13/17  Yes Emokpae, Courage, MD  diclofenac sodium (VOLTAREN) 1 % GEL Apply 2 g topically 4 (four) times daily. 08/01/17  Yes Mikhail, [name], DO  dicyclomine (BENTYL) 20 MG tablet Take 1 tablet (20 mg total) by mouth 2 (two) times daily. 12/17/17  Yes Maczis, Elmer Sow, PA-C  doxycycline (VIBRAMYCIN) 100 MG capsule Take 1 capsule (100 mg total) by mouth 2 (two) times daily. 11/26/17  Yes Petrucelli, Samantha R, PA-C    ferrous sulfate 325 (65 FE) MG tablet Take 1 tablet (325 mg total) by mouth 2 (two) times daily with a meal. 05/13/17  Yes Henson, Vickie L, NP-C  insulin lispro (HUMALOG) 100 UNIT/ML injection Inject 0.02 mLs (2 Units total) into the skin 3 (three) times daily with meals. Patient taking differently: Inject 0-9 Units into the skin 3 (three) times daily with meals. Sliding scale 04/09/17  Yes Romero Belling, MD  LANTUS 100 UNIT/ML injection Inject 0.12 mLs (12 Units total) into the skin daily. 07/13/17  Yes Emokpae, Courage, MD  mirtazapine (REMERON) 15 MG tablet Take 1 tablet (15 mg total) by mouth at bedtime. 10/20/17  Yes Armbruster, Willaim Rayas, MD  pantoprazole (PROTONIX) 40 MG tablet Take 1 tablet (40  mg total) by mouth daily. 12/18/17  Yes Armbruster, Willaim Rayas, MD  promethazine (PHENERGAN) 12.5 MG tablet Take 1 tablet (12.5 mg total) by mouth every 8 (eight) hours as needed for nausea or vomiting. 10/02/17  Yes Armbruster, Willaim Rayas, MD  sucralfate (CARAFATE) 1 g tablet Take 1 tablet (1 g total) by mouth 4 (four) times daily -  with meals and at bedtime. 09/17/17  Yes Mancel Bale, MD  acetaminophen (TYLENOL) 500 MG tablet Take 1,000 mg by mouth daily as needed for moderate pain.     [provider]  metoCLOPramide (REGLAN) 10 MG tablet Take 1 tablet (10 mg total) by mouth every 6 (six) hours as needed for nausea or vomiting. 09/02/17   Jacalyn Lefevre, MD  ondansetron (ZOFRAN ODT) 4 MG disintegrating tablet Take 1 tablet (4 mg total) by mouth every 8 (eight) hours as needed for nausea or vomiting. 10/02/17   Armbruster, Willaim Rayas, MD  ondansetron (ZOFRAN) 8 MG tablet Take 1 tablet (8 mg total) by mouth every 8 (eight) hours as needed for nausea or vomiting. 09/17/17   Mancel Bale, MD    Current Facility-Administered Medications  Medication Dose Route Frequency Provider Last Rate Last Dose  . 0.9 %  sodium chloride infusion   Intravenous Continuous Hillary Bow, DO 100 mL/hr at 12/23/17 0836     . acetaminophen (TYLENOL) suppository 975 mg  975 mg Rectal Q6H Narda Bonds, MD       Or  . acetaminophen (TYLENOL) tablet 1,000 mg  1,000 mg Oral Q6H Nettey, Ralph A, MD      . capsaicin (ZOSTRIX) 0.025 % cream   Topical BID Narda Bonds, MD      . ciprofloxacin (CIPRO) IVPB 400 mg  400 mg Intravenous Q12H Narda Bonds, MD 200 mL/hr at 12/23/17 0725 400 mg at 12/23/17 0725  . citalopram (CELEXA) tablet 20 mg  20 mg Oral Daily Lyda Perone M, DO   20 mg at 12/21/17 1048  . dicyclomine (BENTYL) tablet 20 mg  20 mg Oral BID Lyda Perone M, DO   20 mg at 12/22/17 2310  . enoxaparin (LOVENOX) injection 40 mg  40 mg Subcutaneous Daily Lyda Perone M, DO   40 mg at 12/22/17 7340  . feeding supplement (BOOST / RESOURCE BREEZE) liquid 1 Container  1 Container Oral BID BM Narda Bonds, MD      . ferrous sulfate tablet 325 mg  325 mg Oral BID WC Lyda Perone M, DO   325 mg at 12/21/17 0809  . hydrALAZINE (APRESOLINE) injection 5 mg  5 mg Intravenous Q6H PRN Narda Bonds, MD   5 mg at 12/22/17 3709  . hyoscyamine (LEVSIN) 0.5 MG/ML injection 0.5 mg  0.5 mg Intravenous Once Charissa Bash, NP      . Influenza vac split quadrivalent PF (FLUARIX) injection 0.5 mL  0.5 mL Intramuscular Tomorrow-1000 Narda Bonds, MD      . insulin aspart (novoLOG) injection 0-9 Units  0-9 Units Subcutaneous Q4H Hillary Bow, DO   1 Units at 12/21/17 1259  . insulin glargine (LANTUS) injection 10 Units  10 Units Subcutaneous Daily Hillary Bow, DO   10 Units at 12/22/17 6438  . iopamidol (ISOVUE-370) 76 % injection 100 mL  100 mL Intravenous Once PRN Hanley Ben, Kshitiz, MD      . losartan (COZAAR) tablet 25 mg  25 mg Oral Daily Narda Bonds, MD      . metoCLOPramide (  REGLAN) injection 10 mg  10 mg Intravenous Q6H Lyda Perone M, DO   10 mg at 12/22/17 1635  . metroNIDAZOLE (FLAGYL) IVPB 500 mg  500 mg Intravenous Q8H Narda Bonds, MD 100 mL/hr at 12/23/17 0311 500 mg at 12/23/17 0311  .  mirtazapine (REMERON) tablet 15 mg  15 mg Oral QHS Lyda Perone M, DO   15 mg at 12/22/17 2309  . morphine 4 MG/ML injection 4 mg  4 mg Intravenous Q4H PRN Narda Bonds, MD   4 mg at 12/23/17 0731  . multivitamin with minerals tablet 1 tablet  1 tablet Oral Daily Narda Bonds, MD      . ondansetron [facility]) tablet 4 mg  4 mg Oral Q6H PRN Hillary Bow, DO       Or  . ondansetron [facility]) injection 4 mg  4 mg Intravenous Q6H PRN Hillary Bow, DO   4 mg at 12/23/17 0513  . oxyCODONE-acetaminophen (PERCOCET/ROXICET) 5-325 MG per tablet 1-2 tablet  1-2 tablet Oral Q6H PRN Narda Bonds, MD   2 tablet at 12/22/17 2055  . pantoprazole (PROTONIX) EC tablet 40 mg  40 mg Oral Daily Lyda Perone M, DO   40 mg at 12/21/17 1048  . pneumococcal 23 valent vaccine (PNU-IMMUNE) injection 0.5 mL  0.5 mL Intramuscular Tomorrow-1000 Narda Bonds, MD      . potassium chloride SA (K-DUR,KLOR-CON) CR tablet 40 mEq  40 mEq Oral Once Schorr, Roma Kayser, NP      . potassium chloride SA (K-DUR,KLOR-CON) CR tablet 40 mEq  40 mEq Oral Once Glade Lloyd, MD      . promethazine (PHENERGAN) tablet 25 mg  25 mg Oral Q6H PRN Narda Bonds, MD   25 mg at 12/22/17 2055  . sodium chloride flush (NS) 0.9 % injection 10-40 mL  10-40 mL Intracatheter PRN Narda Bonds, MD      . sucralfate (CARAFATE) tablet 1 g  1 g Oral TID WC & HS Hillary Bow, DO   1 g at 12/21/17 2314    Allergies as of 12/18/2017 - Review Complete 12/18/2017  Allergen Reaction Noted  . Other Anaphylaxis 12/13/2011  . Lactose intolerance (gi) Diarrhea 01/22/2015     Review of Systems:    Constitutional: No fever or chills Skin: No rash Cardiovascular: No chest pain Respiratory: No SOB Gastrointestinal: See HPI and otherwise negative Genitourinary: No dysuria Neurological: No headache, dizziness or syncope Musculoskeletal: No new muscle or joint pain Hematologic: No bleeding  Psychiatric: No history of depression or  anxiety    Physical Exam:  Vital signs in last 24 hours: Temp:  [98.3 F (36.8 C)-99 F (37.2 C)] 98.5 F (36.9 C) (09/25 0535) Pulse Rate:  [102-103] 103 (09/25 0535) Resp:  [10-20] 18 (09/25 0535) BP: (130-156)/(90-99) 156/98 (09/25 0535) SpO2:  [98 %-100 %] 98 % (09/25 0535) Last BM Date: 12/22/17 General:   AA female in mild distress, tearful, Well developed, Well nourished, alert and cooperative Head:  Normocephalic and atraumatic. Eyes:   PEERL, EOMI. No icterus. Conjunctiva pink. Ears:  Normal auditory acuity. Neck:  Supple Throat: Oral cavity and pharynx without inflammation, swelling or lesion.  Lungs: Respirations even and unlabored. Lungs clear to auscultation bilaterally.   No wheezes, crackles, or rhonchi.  Heart: Normal S1, S2. No MRG. Regular rate and rhythm. No peripheral edema, cyanosis or pallor.  Abdomen:  Soft, nondistended, moderate epigastric ttp. No rebound or guarding. Normal bowel sounds. No  appreciable masses or hepatomegaly. Rectal:  Not performed.  Msk:  Symmetrical without gross deformities. Peripheral pulses intact.  Extremities:  Without edema, no deformity or joint abnormality. Normal ROM, normal sensation. Neurologic:  Alert and  oriented x4;  grossly normal neurologically.  Skin:   Dry and intact without significant lesions or rashes. Psychiatric: Demonstrates good judgement and reason without abnormal affect or behaviors.   LAB RESULTS:  BMET Recent Labs    12/21/17 0511 12/23/17 0328  NA 138 145  K 3.1* 2.3*  CL 99 104  CO2 25 29  GLUCOSE 184* 108*  BUN 19 8  CREATININE 0.54 0.49  CALCIUM 8.7* 8.7*   EXAM: CT ABDOMEN AND PELVIS WITH CONTRAST  TECHNIQUE: Multidetector CT imaging of the abdomen and pelvis was performed using the standard protocol following bolus administration of intravenous contrast.  CONTRAST:  ISOVUE-300 IOPAMIDOL (ISOVUE-300) INJECTION 61%  COMPARISON:  CT 06/19/2017, 05/27/2017, 04/10/2017,  03/23/2017  FINDINGS: Lower chest: Lung bases demonstrate no acute consolidation or pleural effusion. The heart size is within normal limits.  Hepatobiliary: No focal liver abnormality is seen. Status post cholecystectomy. No biliary dilatation.  Pancreas: Unremarkable. No pancreatic ductal dilatation or surrounding inflammatory changes. Pancreas is atrophic in appearance.  Spleen: Normal in size without focal abnormality.  Adrenals/Urinary Tract: Adrenal glands are unremarkable. Kidneys are normal, without renal calculi, focal lesion, or hydronephrosis. Bladder is unremarkable.  Stomach/Bowel: Stomach is nonenlarged. No dilated small bowel. There is diffuse under distension of the entire colon with slightly dense appearance of the opposed mucosa. No definitive inflammatory change in the fat surrounding the colon. Negative appendix. Perirectal soft tissue thickening with slight asymmetric skin thickening at the right gluteal fold. This is contiguous with linear density that extends to the right gluteal region where there is bandlike soft tissue thickening at the site of prior abscess. No fluid distension or gas in the region.  Vascular/Lymphatic: Nonaneurysmal aorta. No significantly enlarged lymph nodes  Reproductive: Uterus and bilateral adnexa are unremarkable.  Other: Negative for free air or free fluid.  Musculoskeletal: No acute or significant osseous findings.  IMPRESSION: 1. Slightly thickened appearance of the colon diffusely but without surrounding inflammatory changes in the colon fat, findings could be secondary to diffuse under distension of the colon versus a mild colitis. There is perianal soft tissue thickening. 2. Residual bandlike soft tissue density in the right gluteal region, likely representing scarring from prior abscess in the region. There is possible possible anal cutaneous fistula as evidenced by slight asymmetric thickening of the  right gluteal fold and linear density extending from the right posterior anal region towards the soft tissue thickening at the right gluteal region. There is no evidence for fluid collection or abscess in the soft tissues.   Electronically Signed   By: Jasmine Pang M.D.   On: 12/18/2017 21:44      Impression / Plan:   Impression: 1.  Gastroparesis: With intractable nausea and vomiting, symptoms initially helped by warm showers, but no longer 2.  Abnormal CT of the abdomen and pelvis: Showing slightly thickened appearance of the colon diffusely but without stranding inflammatory changes which could just represent underdistention versus mild colitis; currently on Cipro and Flagyl; CTA abdomen pelvis pending negative urine pregnancy test 3. Epigastric pain: 8-9/10, no relief since hospitalization  Plan: 1.  Stopped Pantoprazole 40 mg p.o., started Pantoprazole 40 mg IV twice daily 2.  Stopped Promethazine p.o., started Promethazine 25 mg suppositories every 6 3.  Continue Reglan 10  mg every 6 4.  Continue Zofran IV every 6 5.  Agree with CTA scheduled for later today 6.  Please await any further recommendations from Dr. Meridee Score later today  Thank you for your kind consultation, we will continue to follow.  Violet Baldy Josey Dettmann  12/23/2017, 10:00 AM

## 2017-12-23 NOTE — Progress Notes (Signed)
CRITICAL VALUE ALERT  Critical Value: K 2.5  Date & Time Notied:  12/23/17  1620  Provider Notified: [name] (paged)  Orders Received/Actions taken: IVPB   K

## 2017-12-24 DIAGNOSIS — K3184 Gastroparesis: Secondary | ICD-10-CM

## 2017-12-24 DIAGNOSIS — R1013 Epigastric pain: Secondary | ICD-10-CM

## 2017-12-24 DIAGNOSIS — R933 Abnormal findings on diagnostic imaging of other parts of digestive tract: Secondary | ICD-10-CM

## 2017-12-24 LAB — CULTURE, BLOOD (ROUTINE X 2)
Culture: NO GROWTH
Culture: NO GROWTH
Special Requests: ADEQUATE
Special Requests: ADEQUATE

## 2017-12-24 LAB — COMPREHENSIVE METABOLIC PANEL
ALT: 11 U/L (ref 0–44)
AST: 16 U/L (ref 15–41)
Albumin: 3.2 g/dL — ABNORMAL LOW (ref 3.5–5.0)
Alkaline Phosphatase: 62 U/L (ref 38–126)
Anion gap: 13 (ref 5–15)
BUN: <5 mg/dL — ABNORMAL LOW (ref 6–20)
CO2: 27 mmol/L (ref 22–32)
Calcium: 8.5 mg/dL — ABNORMAL LOW (ref 8.9–10.3)
Chloride: 98 mmol/L (ref 98–111)
Creatinine, Ser: 0.47 mg/dL (ref 0.44–1.00)
GFR calc Af Amer: >60 mL/min (ref >60)
GFR calc non Af Amer: >60 mL/min (ref >60)
Glucose, Bld: 142 mg/dL — ABNORMAL HIGH (ref 70–99)
Potassium: 2.9 mmol/L — ABNORMAL LOW (ref 3.5–5.1)
Sodium: 138 mmol/L (ref 135–145)
Total Bilirubin: 1.6 mg/dL — ABNORMAL HIGH (ref 0.3–1.2)
Total Protein: 6.4 g/dL — ABNORMAL LOW (ref 6.5–8.1)

## 2017-12-24 LAB — CBC WITH DIFFERENTIAL/PLATELET
Basophils Absolute: 0 10*3/uL (ref 0.0–0.1)
Basophils Relative: 1 %
Eosinophils Absolute: 0 10*3/uL (ref 0.0–0.7)
Eosinophils Relative: 1 %
HCT: 30.3 % — ABNORMAL LOW (ref 36.0–46.0)
Hemoglobin: 10.4 g/dL — ABNORMAL LOW (ref 12.0–15.0)
Lymphocytes Relative: 56 %
Lymphs Abs: 2.4 10*3/uL (ref 0.7–4.0)
MCH: 28.7 pg (ref 26.0–34.0)
MCHC: 34.3 g/dL (ref 30.0–36.0)
MCV: 83.7 fL (ref 78.0–100.0)
Monocytes Absolute: 0.4 10*3/uL (ref 0.1–1.0)
Monocytes Relative: 10 %
Neutro Abs: 1.4 10*3/uL — ABNORMAL LOW (ref 1.7–7.7)
Neutrophils Relative %: 32 %
Platelets: 235 10*3/uL (ref 150–400)
RBC: 3.62 MIL/uL — ABNORMAL LOW (ref 3.87–5.11)
RDW: 12.1 % (ref 11.5–15.5)
WBC: 4.3 10*3/uL (ref 4.0–10.5)

## 2017-12-24 LAB — GLUCOSE, CAPILLARY
Glucose-Capillary: 104 mg/dL — ABNORMAL HIGH (ref 70–99)
Glucose-Capillary: 122 mg/dL — ABNORMAL HIGH (ref 70–99)
Glucose-Capillary: 136 mg/dL — ABNORMAL HIGH (ref 70–99)
Glucose-Capillary: 137 mg/dL — ABNORMAL HIGH (ref 70–99)
Glucose-Capillary: 163 mg/dL — ABNORMAL HIGH (ref 70–99)
Glucose-Capillary: 167 mg/dL — ABNORMAL HIGH (ref 70–99)

## 2017-12-24 LAB — POTASSIUM: Potassium: 3.3 mmol/L — ABNORMAL LOW (ref 3.5–5.1)

## 2017-12-24 LAB — MAGNESIUM
Magnesium: 1.5 mg/dL — ABNORMAL LOW (ref 1.7–2.4)
Magnesium: 1.9 mg/dL (ref 1.7–2.4)

## 2017-12-24 LAB — BILIRUBIN, DIRECT: Bilirubin, Direct: 0.2 mg/dL (ref 0.0–0.2)

## 2017-12-24 MED ORDER — POTASSIUM CHLORIDE 10 MEQ/100ML IV SOLN
10.0000 meq | INTRAVENOUS | Status: AC
  Administered 2017-12-24 (×4): 10 meq via INTRAVENOUS
  Filled 2017-12-24 (×4): qty 100

## 2017-12-24 MED ORDER — LOSARTAN POTASSIUM 50 MG PO TABS
100.0000 mg | ORAL_TABLET | Freq: Every day | ORAL | Status: DC
  Administered 2017-12-25: 100 mg via ORAL
  Filled 2017-12-24: qty 2

## 2017-12-24 MED ORDER — MAGNESIUM SULFATE 2 GM/50ML IV SOLN
2.0000 g | Freq: Once | INTRAVENOUS | Status: AC
  Administered 2017-12-24: 2 g via INTRAVENOUS
  Filled 2017-12-24: qty 50

## 2017-12-24 NOTE — Progress Notes (Signed)
Progress Note   Subjective  Chief Complaint: Gastroparesis with intractable nausea and vomiting, epigastric pain  Patient continues with pain and nausea as well as vomiting today.  Discusses that she tried to eat part of a slushie yesterday but vomited this right back up.  Has not received any suppositories for nausea but did order one for this morning.  Denies any improvement overnight.  Has continued with multiple loose stools which "is normal for me".  Also discusses a sensation of her saliva getting stuck after she swallows it down and she is having to spit it back up, this is new per this episode of gastroparesis.   Objective   Vital signs in last 24 hours: Temp:  [97.6 F (36.4 C)-98.5 F (36.9 C)] 98.5 F (36.9 C) (09/26 0442) Pulse Rate:  [107-110] 109 (09/26 0442) Resp:  [14-18] 18 (09/26 0442) BP: (140-158)/(97-104) 140/97 (09/26 0442) SpO2:  [98 %-100 %] 98 % (09/26 0442) Last BM Date: 12/22/17 General:    Ill-appearing, AA female in NAD Heart:  Regular rate and rhythm; no murmurs Lungs: Respirations even and unlabored, lungs CTA bilaterally Abdomen:  Soft, epigastric TTP and nondistended. Normal bowel sounds. Extremities:  Without edema. Neurologic:  Alert and oriented,  grossly normal neurologically. Psych:  Cooperative. Normal mood and affect.  Intake/Output from previous day: 09/25 0701 - 09/26 0700 In: 2115.7 [P.O.:220; I.V.:900; IV Piggyback:995.7] Out: 2900 [Urine:2900]  Lab Results: Recent Labs    12/24/17 0355  WBC 4.3  HGB 10.4*  HCT 30.3*  PLT 235   BMET Recent Labs    12/23/17 0328 12/23/17 1509 12/24/17 0355  NA 145  --  138  K 2.3* 2.5* 2.9*  CL 104  --  98  CO2 29  --  27  GLUCOSE 108*  --  142*  BUN 8  --  <5*  CREATININE 0.49  --  0.47  CALCIUM 8.7*  --  8.5*   LFT Recent Labs    12/24/17 0355  PROT 6.4*  ALBUMIN 3.2*  AST 16  ALT 11  ALKPHOS 62  BILITOT 1.6*   Studies/Results: Ct Angio Abd/pel W/ And/or  W/o  Result Date: 12/23/2017 CLINICAL DATA:  diabetes mellitus type 1, diabetic gastroparesis and cyclic vomiting syndrome.Pt. C/o diffuse abdominal pain and back pain x's 1 week, nausea,vomiting, diarrheaNegative Urine pregnancy test EXAM: CTA ABDOMEN AND PELVIS WITH CONTRAST TECHNIQUE: Multidetector CT imaging of the abdomen and pelvis was performed using the standard protocol during bolus administration of intravenous contrast. Multiplanar reconstructed images and MIPs were obtained and reviewed to evaluate the vascular anatomy. CONTRAST:  ISOVUE-370 IOPAMIDOL (ISOVUE-370) INJECTION 76% COMPARISON:  12/18/2017 and previous FINDINGS: VASCULAR Aorta: Normal caliber aorta without aneurysm, dissection, vasculitis or significant stenosis. Celiac: Patent without evidence of aneurysm, dissection, vasculitis or significant stenosis. SMA: Patent without evidence of aneurysm, dissection, vasculitis or significant stenosis. Renals: Both renal arteries are patent without evidence of aneurysm, dissection, vasculitis, fibromuscular dysplasia or significant stenosis. IMA: Patent without evidence of aneurysm, dissection, vasculitis or significant stenosis. Inflow: Patent without evidence of aneurysm, dissection, vasculitis or significant stenosis. Proximal Outflow: Bilateral common femoral and visualized portions of the superficial and profunda femoral arteries are patent without evidence of aneurysm, dissection, vasculitis or significant stenosis. Veins: Patent hepatic veins, portal vein, SMV, splenic vein, bilateral renal veins, IVC and iliac venous system. No venous pathology identified. Review of the MIP images confirms the above findings. NON-VASCULAR Lower chest: No acute abnormality. Hepatobiliary: No focal liver abnormality is  seen. Status post cholecystectomy. No biliary dilatation. Pancreas: Mild diffuse atrophy and ductal prominence, without focal lesion. Spleen: Normal in size without focal abnormality.  Adrenals/Urinary Tract: Adrenal glands are unremarkable. Kidneys are normal, without renal calculi, focal lesion, or hydronephrosis. Bladder is distended. Stomach/Bowel: Stomach is distended by ingested material. Small bowel is nondilated. Normal appendix. Colon is nondilated. There is suggestion of mild wall thickening in this hepatic flexure, transverse segment to the splenic flexure. Descending segment, sigmoid and rectum unremarkable. Lymphatic: No abdominal or pelvic adenopathy. Reproductive: Uterus and bilateral adnexa are unremarkable. Other: Trace pelvic ascites, which can be physiologic in a menstruating females. No free air. Musculoskeletal: Increase in diffuse mild body wall subcutaneous and mesenteric edema. No fracture or worrisome bone lesion. IMPRESSION: VASCULAR 1. No Significant abnormalities. NON-VASCULAR 1. Persistent mild wall thickening suspected in the transverse colon. 2. Marked distention of the urinary bladder. 3. New body wall and mesenteric edema. Electronically Signed   By: Corlis Leak M.D.   On: 12/23/2017 14:21       Assessment / Plan:   Assessment: 1.  Gastroparesis: With intractable nausea and vomiting, chronic for the patient with 95+ ER and hospital visits over the past 5+ years 2.  Abnormal CT of the abdomen pelvis: CTA as above 3.  Epigastric pain: 8-9/10 no relief  Plan: 1.  Continue Pantoprazole 40 mg twice daily IV 2.  Continue Promethazine 25 mg suppositories every 6 hours as needed 3.  Continue Reglan 10 mg every 6 hours for now 4.  Continue Zofran IV every 6 hours 5.  Would recommend discontinuing Ciprofloxacin and Flagyl as patient has been on these over the past 5+ days and has had no benefit 6.  Patient would likely benefit from referral to [facility] in the future for gastroparesis 7.  Ordered magnesium and potassium to be done every afternoon, these need to be replenished on a regular basis 8.  Ordered a fecal pancreatic elastase 9.  Ordered a direct  bilirubin 10.  Please await any further recommendations from Dr. Meridee Score later today  Thank you for kind consultation, we will continue to follow.    LOS: 3 days   Unk Lightning  12/24/2017, 11:09 AM

## 2017-12-24 NOTE — Progress Notes (Addendum)
Patient ID: [name], female   DOB: 1990-09-02, 27 y.o.   MRN: [id number]  PROGRESS NOTE    [name]  LOV:[id number] DOB: March 30, 1991 DOA: 12/18/2017 PCP: Patient, No Pcp Per   Brief Narrative:  27 year old female with history of diabetes mellitus type 1, diabetic gastroparesis and cyclical vomiting syndrome presented with nausea and vomiting on 12/18/2017 along with abdominal pain.  CT of the abdomen showed mild colitis.  Patient was started on IV antibiotics along with IV fluids and antiemetics.   Assessment & Plan:   Principal Problem:   Intractable nausea and vomiting Active Problems:   Gastroparesis   Diabetes mellitus type 1 (HCC)  Intractable nausea and vomiting most likely secondary to gastroparesis -There is also a possibility of cyclical vomiting syndrome from cannabis use -Recent EGD was unremarkable except for findings consistent with gastroparesis -Patient still nauseous and complains of vomiting.   -GI following.  Continue Zofran, Reglan.  Continue promethazine suppositories as needed.  Continue Protonix IV twice a day  Abdominal pain/mild colitis on CT scan -Still complains of severe abdominal pain.  Unclear if the patient also has pain medication seeking behavior  -has been on ciprofloxacin and Flagyl since admission with no improvement of symptoms.  CT angiogram of abdomen and pelvis showed no vascular abnormality: Has persistent colonic thickening -DC antibiotics and monitor.  Hypokalemia -Replace.  Repeat a.m. labs.  Patient persistently refuses oral potassium supplementation  Hypomagnesemia -Replace.  Repeat a.m. labs  Diabetes mellitus type 1 -Continue Lantus and sliding scale insulin coverage  Hypertensive urgency -Improving.  BP still elevated.  Increase losartan to 100 milligrams daily   DVT prophylaxis: Lovenox Code Status: Full  family Communication: None at bedside Disposition Plan: Home in 1 to 2 days if clinically  improves  Consultants: GI  Procedures: None  Antimicrobials: Cipro and Flagyl from 12/19/2017 onwards   Subjective: Patient seen and examined at bedside.  She still complains of nausea and vomiting and is unable to tolerate diet.  She complains of intermittent abdominal pain.  No fevers.  Objective: Vitals:   12/23/17 1425 12/23/17 2057 12/24/17 0442 12/24/17 1230  BP: (!) 147/104 (!) 158/103 (!) 140/97 (!) 148/99  Pulse: (!) 107 (!) 110 (!) 109 (!) 106  Resp: 14 18 18 18   Temp: 98.5 F (36.9 C) 97.6 F (36.4 C) 98.5 F (36.9 C) 99 F (37.2 C)  TempSrc: Oral Oral Oral Oral  SpO2: 100% 100% 98% 98%  Weight:      Height:        Intake/Output Summary (Last 24 hours) at 12/24/2017 1317 Last data filed at 12/24/2017 8841 Gross per 24 hour  Intake 2115.69 ml  Output 800 ml  Net 1315.69 ml   Filed Weights   12/18/17 1928 12/21/17 1341  Weight: 59 kg 59.6 kg    Examination:  General exam: Appears in mild distress secondary to nausea and abdominal pain.  Appears older than stated age. Respiratory system: Bilateral decreased breath sounds at bases, no wheezing Cardiovascular system: S1 & S2 heard; tachycardic Gastrointestinal system: Abdomen is nondistended, soft and tender in epigastric/periumbilical regions. Normal bowel sounds heard. Extremities: No cyanosis,  edema   Data Reviewed: I have personally reviewed following labs and imaging studies  CBC: Recent Labs  Lab 12/18/17 1636 12/19/17 0501 12/20/17 0852 12/24/17 0355  WBC 9.0 7.2 5.7 4.3  NEUTROABS  --   --   --  1.4*  HGB 11.6* 12.4 10.9* 10.4*  HCT 33.7* 35.6* 31.7* 30.3*  MCV 83.8 83.2 85.2 83.7  PLT 259 253 226 458   Basic Metabolic Panel: Recent Labs  Lab 12/19/17 0501 12/20/17 0852 12/21/17 0511 12/23/17 0328 12/23/17 1509 12/24/17 0355  NA 142 139 138 145  --  138  K 4.1 3.2* 3.1* 2.3* 2.5* 2.9*  CL 102 101 99 104  --  98  CO2 22 22 25 29   --  27  GLUCOSE 272* 219* 184* 108*  --  142*   BUN 19 27* 19 8  --  <5*  CREATININE 0.65 0.67 0.54 0.49  --  0.47  CALCIUM 9.3 8.9 8.7* 8.7*  --  8.5*  MG  --   --   --   --  1.8 1.5*   GFR: Estimated Creatinine Clearance: 98.5 mL/min (by C-G formula based on SCr of 0.47 mg/dL). Liver Function Tests: Recent Labs  Lab 12/18/17 1636 12/24/17 0355  AST 25 16  ALT 25 11  ALKPHOS 84 62  BILITOT 0.8 1.6*  PROT 8.2* 6.4*  ALBUMIN 3.9 3.2*   Recent Labs  Lab 12/18/17 1636  LIPASE 18   No results for input(s): AMMONIA in the last 168 hours. Coagulation Profile: No results for input(s): INR, PROTIME in the last 168 hours. Cardiac Enzymes: No results for input(s): CKTOTAL, CKMB, CKMBINDEX, TROPONINI in the last 168 hours. BNP (last 3 results) No results for input(s): PROBNP in the last 8760 hours. HbA1C: No results for input(s): HGBA1C in the last 72 hours. CBG: Recent Labs  Lab 12/23/17 2058 12/23/17 2334 12/24/17 0405 12/24/17 0725 12/24/17 1143  GLUCAP 156* 132* 137* 136* 167*   Lipid Profile: No results for input(s): CHOL, HDL, LDLCALC, TRIG, CHOLHDL, LDLDIRECT in the last 72 hours. Thyroid Function Tests: No results for input(s): TSH, T4TOTAL, FREET4, T3FREE, THYROIDAB in the last 72 hours. Anemia Panel: No results for input(s): VITAMINB12, FOLATE, FERRITIN, TIBC, IRON, RETICCTPCT in the last 72 hours. Sepsis Labs: No results for input(s): PROCALCITON, LATICACIDVEN in the last 168 hours.  Recent Results (from the past 240 hour(s))  Culture, blood (routine x 2)     Status: None   Collection Time: 12/19/17  6:50 PM  Result Value Ref Range Status   Specimen Description BLOOD LEFT ANTECUBITAL  Final   Special Requests   Final    BOTTLES DRAWN AEROBIC AND ANAEROBIC Blood Culture adequate volume   Culture   Final    NO GROWTH 5 DAYS Performed at [facility], [address] [place], [place] [address]    Report Status 12/24/2017 FINAL  Final  Culture, blood (routine x 2)     Status: None   Collection  Time: 12/19/17  6:59 PM  Result Value Ref Range Status   Specimen Description BLOOD RIGHT ANTECUBITAL  Final   Special Requests   Final    BOTTLES DRAWN AEROBIC ONLY Blood Culture adequate volume   Culture   Final    NO GROWTH 5 DAYS Performed at [facility], [place] [address] [place], [place] [address]    Report Status 12/24/2017 FINAL  Final         Radiology Studies: Ct Angio Abd/pel W/ And/or W/o  Result Date: 12/23/2017 CLINICAL DATA:  diabetes mellitus type 1, diabetic gastroparesis and cyclic vomiting syndrome.Pt. C/o diffuse abdominal pain and back pain x's 1 week, nausea,vomiting, diarrheaNegative Urine pregnancy test EXAM: CTA ABDOMEN AND PELVIS WITH CONTRAST TECHNIQUE: Multidetector CT imaging of the abdomen and pelvis was performed using the standard protocol during  bolus administration of intravenous contrast. Multiplanar reconstructed images and MIPs were obtained and reviewed to evaluate the vascular anatomy. CONTRAST:  114m ISOVUE-370 IOPAMIDOL (ISOVUE-370) INJECTION 76% COMPARISON:  12/18/2017 and previous FINDINGS: VASCULAR Aorta: Normal caliber aorta without aneurysm, dissection, vasculitis or significant stenosis. Celiac: Patent without evidence of aneurysm, dissection, vasculitis or significant stenosis. SMA: Patent without evidence of aneurysm, dissection, vasculitis or significant stenosis. Renals: Both renal arteries are patent without evidence of aneurysm, dissection, vasculitis, fibromuscular dysplasia or significant stenosis. IMA: Patent without evidence of aneurysm, dissection, vasculitis or significant stenosis. Inflow: Patent without evidence of aneurysm, dissection, vasculitis or significant stenosis. Proximal Outflow: Bilateral common femoral and visualized portions of the superficial and profunda femoral arteries are patent without evidence of aneurysm, dissection, vasculitis or significant stenosis. Veins: Patent hepatic veins, portal vein, SMV, splenic  vein, bilateral renal veins, IVC and iliac venous system. No venous pathology identified. Review of the MIP images confirms the above findings. NON-VASCULAR Lower chest: No acute abnormality. Hepatobiliary: No focal liver abnormality is seen. Status post cholecystectomy. No biliary dilatation. Pancreas: Mild diffuse atrophy and ductal prominence, without focal lesion. Spleen: Normal in size without focal abnormality. Adrenals/Urinary Tract: Adrenal glands are unremarkable. Kidneys are normal, without renal calculi, focal lesion, or hydronephrosis. Bladder is distended. Stomach/Bowel: Stomach is distended by ingested material. Small bowel is nondilated. Normal appendix. Colon is nondilated. There is suggestion of mild wall thickening in this hepatic flexure, transverse segment to the splenic flexure. Descending segment, sigmoid and rectum unremarkable. Lymphatic: No abdominal or pelvic adenopathy. Reproductive: Uterus and bilateral adnexa are unremarkable. Other: Trace pelvic ascites, which can be physiologic in a menstruating females. No free air. Musculoskeletal: Increase in diffuse mild body wall subcutaneous and mesenteric edema. No fracture or worrisome bone lesion. IMPRESSION: VASCULAR 1. No Significant abnormalities. NON-VASCULAR 1. Persistent mild wall thickening suspected in the transverse colon. 2. Marked distention of the urinary bladder. 3. New body wall and mesenteric edema. Electronically Signed   By: DLucrezia EuropeM.D.   On: 12/23/2017 14:21        Scheduled Meds: . acetaminophen  975 mg Rectal Q6H   Or  . acetaminophen  1,000 mg Oral Q6H  . capsaicin   Topical BID  . citalopram  20 mg Oral Daily  . dicyclomine  20 mg Oral BID  . enoxaparin (LOVENOX) injection  40 mg Subcutaneous Daily  . feeding supplement  1 Container Oral BID BM  . ferrous sulfate  325 mg Oral BID WC  . hyoscyamine  0.5 mg Intravenous Once  . Influenza vac split quadrivalent PF  0.5 mL Intramuscular Tomorrow-1000  .  insulin aspart  0-9 Units Subcutaneous Q4H  . insulin glargine  10 Units Subcutaneous Daily  . losartan  50 mg Oral Daily  . metoCLOPramide (REGLAN) injection  10 mg Intravenous Q6H  . mirtazapine  15 mg Oral QHS  . multivitamin with minerals  1 tablet Oral Daily  . pantoprazole (PROTONIX) IV  40 mg Intravenous Q12H  . pneumococcal 23 valent vaccine  0.5 mL Intramuscular Tomorrow-1000  . potassium chloride  40 mEq Oral Once  . potassium chloride  40 mEq Oral Once  . potassium chloride  40 mEq Oral Once  . sucralfate  1 g Oral TID WC & HS   Continuous Infusions: . 0.9 % NaCl with KCl 40 mEq / L 75 mL/hr (12/24/17 03500     LOS: 3 days        KAline August MD [facility] Pager [phone]  If 7PM-7AM, please contact night-coverage [email or web address] Password [email or web address] 12/24/2017, 1:17 PM

## 2017-12-25 ENCOUNTER — Telehealth: Payer: Self-pay

## 2017-12-25 ENCOUNTER — Other Ambulatory Visit: Payer: Self-pay

## 2017-12-25 DIAGNOSIS — R112 Nausea with vomiting, unspecified: Secondary | ICD-10-CM

## 2017-12-25 DIAGNOSIS — K3184 Gastroparesis: Secondary | ICD-10-CM

## 2017-12-25 DIAGNOSIS — R197 Diarrhea, unspecified: Secondary | ICD-10-CM

## 2017-12-25 LAB — CBC WITH DIFFERENTIAL/PLATELET
Basophils Absolute: 0 10*3/uL (ref 0.0–0.1)
Basophils Relative: 1 %
Eosinophils Absolute: 0.1 10*3/uL (ref 0.0–0.7)
Eosinophils Relative: 2 %
HCT: 29.6 % — ABNORMAL LOW (ref 36.0–46.0)
Hemoglobin: 10.1 g/dL — ABNORMAL LOW (ref 12.0–15.0)
Lymphocytes Relative: 63 %
Lymphs Abs: 3.7 10*3/uL (ref 0.7–4.0)
MCH: 28.8 pg (ref 26.0–34.0)
MCHC: 34.1 g/dL (ref 30.0–36.0)
MCV: 84.3 fL (ref 78.0–100.0)
Monocytes Absolute: 0.3 10*3/uL (ref 0.1–1.0)
Monocytes Relative: 6 %
Neutro Abs: 1.6 10*3/uL — ABNORMAL LOW (ref 1.7–7.7)
Neutrophils Relative %: 28 %
Platelets: 232 10*3/uL (ref 150–400)
RBC: 3.51 MIL/uL — ABNORMAL LOW (ref 3.87–5.11)
RDW: 12.3 % (ref 11.5–15.5)
WBC: 5.7 10*3/uL (ref 4.0–10.5)

## 2017-12-25 LAB — COMPREHENSIVE METABOLIC PANEL
ALT: 13 U/L (ref 0–44)
AST: 19 U/L (ref 15–41)
Albumin: 3.1 g/dL — ABNORMAL LOW (ref 3.5–5.0)
Alkaline Phosphatase: 64 U/L (ref 38–126)
Anion gap: 10 (ref 5–15)
BUN: 5 mg/dL — ABNORMAL LOW (ref 6–20)
CO2: 29 mmol/L (ref 22–32)
Calcium: 8.4 mg/dL — ABNORMAL LOW (ref 8.9–10.3)
Chloride: 101 mmol/L (ref 98–111)
Creatinine, Ser: 0.41 mg/dL — ABNORMAL LOW (ref 0.44–1.00)
GFR calc Af Amer: >60 mL/min (ref >60)
GFR calc non Af Amer: >60 mL/min (ref >60)
Glucose, Bld: 163 mg/dL — ABNORMAL HIGH (ref 70–99)
Potassium: 3.6 mmol/L (ref 3.5–5.1)
Sodium: 140 mmol/L (ref 135–145)
Total Bilirubin: 1.3 mg/dL — ABNORMAL HIGH (ref 0.3–1.2)
Total Protein: 6.2 g/dL — ABNORMAL LOW (ref 6.5–8.1)

## 2017-12-25 LAB — GLUCOSE, CAPILLARY
Glucose-Capillary: 178 mg/dL — ABNORMAL HIGH (ref 70–99)
Glucose-Capillary: 173 mg/dL — ABNORMAL HIGH (ref 70–99)
Glucose-Capillary: 135 mg/dL — ABNORMAL HIGH (ref 70–99)

## 2017-12-25 LAB — TSH: TSH: 1.231 u[IU]/mL (ref 0.350–4.500)

## 2017-12-25 MED ORDER — METOCLOPRAMIDE HCL 10 MG PO TABS: 10.0000 mg | ORAL_TABLET | Freq: Four times a day (QID) | ORAL | 0 refills | Status: DC | PRN

## 2017-12-25 MED ORDER — PANTOPRAZOLE SODIUM 40 MG PO TBEC: 40.0000 mg | DELAYED_RELEASE_TABLET | Freq: Two times a day (BID) | ORAL | 0 refills | Status: DC

## 2017-12-25 MED ORDER — LOSARTAN POTASSIUM 100 MG PO TABS: 100.0000 mg | ORAL_TABLET | Freq: Every day | ORAL | 0 refills | Status: DC

## 2017-12-25 MED ORDER — PROMETHAZINE HCL 25 MG RE SUPP: 25.0000 mg | Freq: Two times a day (BID) | RECTAL | 0 refills | Status: DC | PRN

## 2017-12-25 MED ORDER — DICYCLOMINE HCL 20 MG PO TABS: 20.0000 mg | ORAL_TABLET | Freq: Two times a day (BID) | ORAL | 0 refills | Status: DC

## 2017-12-25 MED ORDER — ONDANSETRON 4 MG PO TBDP: 4.0000 mg | ORAL_TABLET | Freq: Three times a day (TID) | ORAL | 0 refills | Status: DC | PRN

## 2017-12-25 MED ORDER — INSULIN LISPRO 100 UNIT/ML ~~LOC~~ SOLN: 0.0000 [IU] | Freq: Three times a day (TID) | SUBCUTANEOUS | Status: DC

## 2017-12-25 MED ORDER — OXYCODONE-ACETAMINOPHEN 5-325 MG PO TABS: 1.0000 | ORAL_TABLET | Freq: Four times a day (QID) | ORAL | 0 refills | Status: DC | PRN

## 2017-12-25 NOTE — Telephone Encounter (Signed)
01/18/18 at 8:45 am with Hyacinth Meeker.  I have sent a referral to [facility]. The patient has been notified of this information and all questions answered. The pt has been advised of the information and verbalized understanding.

## 2017-12-25 NOTE — Discharge Summary (Signed)
Physician Discharge Summary  Tacy Chavis Correll HOZ:[id number] DOB: 08-23-1990 DOA: 12/18/2017  PCP: Patient, No Pcp Per  Admit date: 12/18/2017 Discharge date: 12/25/2017  Admitted From: Home Disposition:  Home  Recommendations for Outpatient Follow-up:  1. Follow up with PCP in 1 week 2. Follow-up with gastroenterology at earliest [place]: No Equipment/Devices: None  Discharge Condition: Stable CODE STATUS: Full Diet recommendation: Heart Healthy / Carb Modified    Brief/Interim Summary: 27 year old female with history of diabetes mellitus type 1, diabetic gastroparesis and cyclical vomiting syndrome presented with nausea and vomiting on 12/18/2017 along with abdominal pain.  CT of the abdomen showed mild colitis.  Patient was started on IV antibiotics along with IV fluids and antiemetics.  Despite being on antibiotics and antiemetics, patient continued to have symptoms of nausea and abdominal pain.  GI was consulted.  Patient was started on  promethazine suppository by GI.  Condition has gradually improved.  She feels better and wants to go home.  She will be discharged home with close outpatient follow-up with GI.  Discharge Diagnoses:  Principal Problem:   Intractable nausea and vomiting Active Problems:   Gastroparesis   Diabetes mellitus type 1 (HCC)  Intractable nausea and vomiting most likely secondary to gastroparesis -There is also a possibility of cyclical vomiting syndrome from cannabis use -Recent EGD was unremarkable except for findings consistent with gastroparesis -GI was consulted and patient was started on promethazine suppositories as needed.  She feels better and is tolerating diet.  She was to go home.  GI has cleared the patient for discharge.  Discharge home on Zofran, Reglan as needed along with promethazine suppositories as needed.  Close outpatient follow-up with GI.  Continue Protonix twice a day   abdominal pain/mild colitis on CT scan -  Unclear if the patient also has pain medication seeking behavior  -Treated with Cipro and Flagyl, did not have much improvement in abdominal pain.  Antibiotics have been subsequently discontinued as per GI recommendations as well.  -Plan as above  - CT angiogram of abdomen and pelvis showed no vascular abnormality: Has persistent colonic thickening  Hypokalemia -Replaced.  Improved  Hypomagnesemia -Replaced.  Improved  Diabetes mellitus type 1 -Continue home regimen.  Outpatient follow-up  Hypertensive urgency -Improving.  BP still elevated.    Discharge home on losartan 100 mg daily   Discharge Instructions  Discharge Instructions    Call MD for:  difficulty breathing, headache or visual disturbances   Complete by:  As directed    Call MD for:  extreme fatigue   Complete by:  As directed    Call MD for:  hives   Complete by:  As directed    Call MD for:  persistant dizziness or light-headedness   Complete by:  As directed    Call MD for:  persistant nausea and vomiting   Complete by:  As directed    Call MD for:  severe uncontrolled pain   Complete by:  As directed    Call MD for:  temperature >100.4   Complete by:  As directed    Diet Carb Modified   Complete by:  As directed    Increase activity slowly   Complete by:  As directed      Allergies as of 12/25/2017      Reactions   Other Anaphylaxis   Reaction to [place] nuts   Lactose Intolerance (gi) Diarrhea      Medication List    STOP taking  these medications   cholestyramine light 4 g packet Commonly known as:  PREVALITE   doxycycline 100 MG capsule Commonly known as:  VIBRAMYCIN   ondansetron 8 MG tablet Commonly known as:  ZOFRAN   promethazine 12.5 MG tablet Commonly known as:  PHENERGAN Replaced by:  promethazine 25 MG suppository     TAKE these medications   acetaminophen 500 MG tablet Commonly known as:  TYLENOL Take 1,000 mg by mouth daily as needed for moderate pain.   citalopram  20 MG tablet Commonly known as:  CELEXA Take 1 tablet (20 mg total) by mouth daily.   diclofenac sodium 1 % Gel Commonly known as:  VOLTAREN Apply 2 g topically 4 (four) times daily.   dicyclomine 20 MG tablet Commonly known as:  BENTYL Take 1 tablet (20 mg total) by mouth 2 (two) times daily.   ferrous sulfate 325 (65 FE) MG tablet Take 1 tablet (325 mg total) by mouth 2 (two) times daily with a meal.   insulin lispro 100 UNIT/ML injection Commonly known as:  HUMALOG Inject 0-0.09 mLs (0-9 Units total) into the skin 3 (three) times daily with meals. What changed:  how much to take   LANTUS 100 UNIT/ML injection Generic drug:  insulin glargine Inject 0.12 mLs (12 Units total) into the skin daily.   losartan 100 MG tablet Commonly known as:  COZAAR Take 1 tablet (100 mg total) by mouth daily. Start taking on:  12/26/2017   metoCLOPramide 10 MG tablet Commonly known as:  REGLAN Take 1 tablet (10 mg total) by mouth every 6 (six) hours as needed for nausea or vomiting.   mirtazapine 15 MG tablet Commonly known as:  REMERON Take 1 tablet (15 mg total) by mouth at bedtime.   ondansetron 4 MG disintegrating tablet Commonly known as:  ZOFRAN-ODT Take 1 tablet (4 mg total) by mouth every 8 (eight) hours as needed for nausea or vomiting.   oxyCODONE-acetaminophen 5-325 MG tablet Commonly known as:  PERCOCET/ROXICET Take 1 tablet by mouth every 6 (six) hours as needed for severe pain.   pantoprazole 40 MG tablet Commonly known as:  PROTONIX Take 1 tablet (40 mg total) by mouth 2 (two) times daily. What changed:  when to take this   promethazine 25 MG suppository Commonly known as:  PHENERGAN Place 1 suppository (25 mg total) rectally every 12 (twelve) hours as needed for refractory nausea / vomiting. Replaces:  promethazine 12.5 MG tablet   sucralfate 1 g tablet Commonly known as:  CARAFATE Take 1 tablet (1 g total) by mouth 4 (four) times daily -  with meals and at  bedtime.      Follow-up Information    PCP. Schedule an appointment as soon as possible for a visit in 1 week(s).        Armbruster, Carlota Raspberry, MD. Schedule an appointment as soon as possible for a visit in 1 week(s).   Specialty:  Gastroenterology Contact information: [address] [place] [address] [phone]          Allergies  Allergen Reactions  . Other Anaphylaxis    Reaction to [place] nuts   . Lactose Intolerance (Gi) Diarrhea    Consultations:  GI   Procedures/Studies: Ct Abdomen Pelvis W Contrast  Result Date: 12/18/2017 CLINICAL DATA:  Generalized abdominal pain EXAM: CT ABDOMEN AND PELVIS WITH CONTRAST TECHNIQUE: Multidetector CT imaging of the abdomen and pelvis was performed using the standard protocol following bolus administration of intravenous contrast.  CONTRAST:  12m ISOVUE-300 IOPAMIDOL (ISOVUE-300) INJECTION 61% COMPARISON:  CT 06/19/2017, 05/27/2017, 04/10/2017, 03/23/2017 FINDINGS: Lower chest: Lung bases demonstrate no acute consolidation or pleural effusion. The heart size is within normal limits. Hepatobiliary: No focal liver abnormality is seen. Status post cholecystectomy. No biliary dilatation. Pancreas: Unremarkable. No pancreatic ductal dilatation or surrounding inflammatory changes. Pancreas is atrophic in appearance. Spleen: Normal in size without focal abnormality. Adrenals/Urinary Tract: Adrenal glands are unremarkable. Kidneys are normal, without renal calculi, focal lesion, or hydronephrosis. Bladder is unremarkable. Stomach/Bowel: Stomach is nonenlarged. No dilated small bowel. There is diffuse under distension of the entire colon with slightly dense appearance of the opposed mucosa. No definitive inflammatory change in the fat surrounding the colon. Negative appendix. Perirectal soft tissue thickening with slight asymmetric skin thickening at the right gluteal fold. This is contiguous with linear density that extends to the  right gluteal region where there is bandlike soft tissue thickening at the site of prior abscess. No fluid distension or gas in the region. Vascular/Lymphatic: Nonaneurysmal aorta. No significantly enlarged lymph nodes Reproductive: Uterus and bilateral adnexa are unremarkable. Other: Negative for free air or free fluid. Musculoskeletal: No acute or significant osseous findings. IMPRESSION: 1. Slightly thickened appearance of the colon diffusely but without surrounding inflammatory changes in the colon fat, findings could be secondary to diffuse under distension of the colon versus a mild colitis. There is perianal soft tissue thickening. 2. Residual bandlike soft tissue density in the right gluteal region, likely representing scarring from prior abscess in the region. 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 There is no evidence for fluid collection or abscess in the soft tissues. Electronically Signed   By: KDonavan FoilM.D.   On: 12/18/2017 21:44   Dg Abd Acute W/chest  Result Date: 12/19/2017 CLINICAL DATA:  Upper abdominal pain with vomiting and diarrhea. EXAM: DG ABDOMEN ACUTE W/ 1V CHEST COMPARISON:  CT scan from yesterday. FINDINGS: The lungs are clear without focal pneumonia, edema, pneumothorax or pleural effusion. The cardiopericardial silhouette is within normal limits for size. The visualized bony structures of the thorax are intact. Upright film shows no evidence for intraperitoneal free air. No gaseous bowel dilatation to suggest obstruction. Moderate air-fluid level noted in the stomach. Bladder is opacified with contrast, consistent with CT scan yesterday. IMPRESSION: 1. No acute cardiopulmonary process. 2. No evidence for bowel obstruction or perforation. Electronically Signed   By: EMisty StanleyM.D.   On: 12/19/2017 20:17   Ct Angio Abd/pel W/ And/or W/o  Result Date: 12/23/2017 CLINICAL DATA:  diabetes mellitus type 1, diabetic gastroparesis and cyclic vomiting syndrome.Pt. C/o diffuse abdominal pain and back pain x's 1 week, nausea,vomiting, diarrheaNegative Urine pregnancy test EXAM: CTA ABDOMEN AND PELVIS WITH CONTRAST TECHNIQUE: Multidetector CT imaging of the abdomen and pelvis was performed using the standard protocol during bolus administration of intravenous contrast. Multiplanar reconstructed images and MIPs were obtained and reviewed to evaluate the vascular anatomy. CONTRAST:  1059mISOVUE-370 IOPAMIDOL (ISOVUE-370) INJECTION 76% COMPARISON:  12/18/2017 and previous FINDINGS: VASCULAR Aorta: Normal caliber aorta without aneurysm, dissection, vasculitis or significant stenosis. Celiac: Patent without evidence of aneurysm, dissection, vasculitis or significant stenosis. SMA: Patent without evidence of aneurysm, dissection, vasculitis or significant stenosis. Renals: Both renal arteries are patent without evidence of aneurysm, dissection, vasculitis, fibromuscular dysplasia or significant  stenosis. IMA: Patent without evidence of aneurysm, dissection, vasculitis or significant stenosis. Inflow: Patent without evidence of aneurysm, dissection, vasculitis or significant stenosis. Proximal Outflow: Bilateral common femoral and visualized portions of the superficial and profunda femoral arteries are patent without evidence of aneurysm, dissection, vasculitis or significant stenosis. Veins: Patent hepatic veins, portal vein, SMV, splenic vein, bilateral renal veins, IVC and iliac venous system. No venous pathology identified. Review of the MIP images confirms the above findings. NON-VASCULAR Lower chest: No acute abnormality. Hepatobiliary: No focal liver abnormality is seen. Status post cholecystectomy. No biliary dilatation. Pancreas: Mild diffuse atrophy and ductal prominence, without  focal lesion. Spleen: Normal in size without focal abnormality. Adrenals/Urinary Tract: Adrenal glands are unremarkable. Kidneys are normal, without renal calculi, focal lesion, or hydronephrosis. Bladder is distended. Stomach/Bowel: Stomach is distended by ingested material. Small bowel is nondilated. Normal appendix. Colon is nondilated. There is suggestion of mild wall thickening in this hepatic flexure, transverse segment to the splenic flexure. Descending segment, sigmoid and rectum unremarkable. Lymphatic: No abdominal or pelvic adenopathy. Reproductive: Uterus and bilateral adnexa are unremarkable. Other: Trace pelvic ascites, which can be physiologic in a menstruating females. No free air. Musculoskeletal: Increase in diffuse mild body wall subcutaneous and mesenteric edema. No fracture or worrisome bone lesion. IMPRESSION: VASCULAR 1. No Significant abnormalities. NON-VASCULAR 1. Persistent mild wall thickening suspected in the transverse colon. 2. Marked distention of the urinary bladder. 3. New body wall and mesenteric edema. Electronically Signed   By: Lucrezia Europe M.D.   On: 12/23/2017 14:21     Subjective: Patient seen and examined at bedside.  She denies any current fever, nausea or vomiting.  She feels better and wants to go home.  She is tolerating diet.  Discharge Exam: Vitals:   12/24/17 2119 12/25/17 0434  BP: 92/65 (!) 151/99  Pulse: 97 (!) 111  Resp: 18 18  Temp: 98.5 F (36.9 C) 98.4 F (36.9 C)  SpO2: 100% 97%   Vitals:   12/24/17 0442 12/24/17 1230 12/24/17 2119 12/25/17 0434  BP: (!) 140/97 (!) 148/99 92/65 (!) 151/99  Pulse: (!) 109 (!) 106 97 (!) 111  Resp: 18 18 18 18   Temp: 98.5 F (36.9 C) 99 F (37.2 C) 98.5 F (36.9 C) 98.4 F (36.9 C)  TempSrc: Oral Oral Oral Oral  SpO2: 98% 98% 100% 97%  Weight:      Height:        General: Pt is alert, awake, not in acute distress Cardiovascular: rate controlled, S1/S2 + Respiratory: bilateral decreased breath  sounds at bases Abdominal: Soft, mildly tender in the periumbilical region, ND, bowel sounds + Extremities: no edema, no cyanosis    The results of significant diagnostics from this hospitalization (including imaging, microbiology, ancillary and laboratory) are listed below for reference.     Microbiology: Recent Results (from the past 240 hour(s))  Culture, blood (routine x 2)     Status: None   Collection Time: 12/19/17  6:50 PM  Result Value Ref Range Status   Specimen Description BLOOD LEFT ANTECUBITAL  Final   Special Requests   Final    BOTTLES DRAWN AEROBIC AND ANAEROBIC Blood Culture adequate volume   Culture   Final    NO GROWTH 5 DAYS Performed at [facility], [address], [place],  [address]    Report Status 12/24/2017 FINAL  Final  Culture, blood (routine x 2)     Status: None   Collection Time: 12/19/17  6:59 PM  Result Value Ref Range Status   Specimen Description BLOOD RIGHT ANTECUBITAL  Final   Special Requests   Final    BOTTLES DRAWN AEROBIC ONLY Blood Culture adequate volume   Culture   Final    NO GROWTH 5 DAYS Performed at [facility], [address] [place], [place] [address]    Report Status 12/24/2017 FINAL  Final     Labs: BNP (last 3 results) No results for input(s): BNP in the last 8760 hours. Basic Metabolic Panel: Recent Labs  Lab 12/20/17 0852 12/21/17 0511 12/23/17 0328 12/23/17 1509 12/24/17 0355 12/24/17 1426 12/25/17 0428  NA 139 138 145  --  138  --  140  K 3.2* 3.1* 2.3* 2.5* 2.9* 3.3* 3.6  CL 101 99 104  --  98  --  101  CO2 22 25 29   --  27  --  29  GLUCOSE 219* 184* 108*  --  142*  --  163*  BUN 27* 19 8  --  <5*  --  5*  CREATININE 0.67 0.54 0.49  --  0.47  --  0.41*  CALCIUM 8.9 8.7* 8.7*  --  8.5*  --  8.4*  MG  --   --   --  1.8 1.5* 1.9  --    Liver Function Tests: Recent Labs  Lab 12/18/17 1636 12/24/17 0355 12/25/17 0428  AST 25 16 19   ALT 25 11 13   ALKPHOS 84 62 64  BILITOT 0.8  1.6* 1.3*  PROT 8.2* 6.4* 6.2*  ALBUMIN 3.9 3.2* 3.1*   Recent Labs  Lab 12/18/17 1636  LIPASE 18   No results for input(s): AMMONIA in the last 168 hours. CBC: Recent Labs  Lab 12/18/17 1636 12/19/17 0501 12/20/17 0852 12/24/17 0355 12/25/17 0428  WBC 9.0 7.2 5.7 4.3 5.7  NEUTROABS  --   --   --  1.4* 1.6*  HGB 11.6* 12.4 10.9* 10.4* 10.1*  HCT 33.7* 35.6* 31.7* 30.3* 29.6*  MCV 83.8 83.2 85.2 83.7 84.3  PLT 259 253 226 235 232   Cardiac Enzymes: No results for input(s): CKTOTAL, CKMB, CKMBINDEX, TROPONINI in the last 168 hours. BNP: Invalid input(s): POCBNP CBG: Recent Labs  Lab 12/24/17 2118 12/24/17 2333 12/25/17 0617 12/25/17 0716 12/25/17 1156  GLUCAP 104* 122* 178* 173* 135*   D-Dimer No results for input(s): DDIMER in the last 72 hours. Hgb A1c No results for input(s): HGBA1C in the last 72 hours. Lipid Profile No results for input(s): CHOL, HDL, LDLCALC, TRIG, CHOLHDL, LDLDIRECT in the last 72 hours. Thyroid function studies Recent Labs    12/25/17 0428  TSH 1.231   Anemia work up No results for input(s): VITAMINB12, FOLATE, FERRITIN, TIBC, IRON, RETICCTPCT in the last 72 hours. Urinalysis    Component Value Date/Time   COLORURINE YELLOW 12/18/2017 1636   APPEARANCEUR CLEAR 12/18/2017 1636   LABSPEC 1.024 12/18/2017 1636   PHURINE 5.0 12/18/2017 1636   GLUCOSEU >=500 (A) 12/18/2017 1636   HGBUR MODERATE (A) 12/18/2017 1636   BILIRUBINUR NEGATIVE 12/18/2017 1636   BILIRUBINUR negative 01/02/2015 1717   KETONESUR 20 (A) 12/18/2017 1636   PROTEINUR 100 (A) 12/18/2017 1636   UROBILINOGEN 0.2 01/13/2015 0223   NITRITE NEGATIVE 12/18/2017 1636   LEUKOCYTESUR NEGATIVE 12/18/2017 1636   Sepsis Labs Invalid input(s): PROCALCITONIN,  WBC,  LACTICIDVEN Microbiology Recent Results (from the past 240 hour(s))  Culture, blood (routine x 2)     Status: None   Collection Time: 12/19/17  6:50 PM  Result Value Ref Range Status   Specimen Description  BLOOD LEFT ANTECUBITAL  Final   Special Requests   Final    BOTTLES DRAWN AEROBIC AND ANAEROBIC Blood Culture adequate volume   Culture   Final    NO GROWTH 5 DAYS Performed at [facility], [address], [place], [place] [address]    Report Status 12/24/2017 FINAL  Final  Culture, blood (routine x 2)     Status: None   Collection Time: 12/19/17  6:59 PM  Result Value Ref Range Status   Specimen Description BLOOD RIGHT ANTECUBITAL  Final   Special Requests   Final    BOTTLES DRAWN AEROBIC ONLY Blood Culture adequate volume   Culture   Final    NO GROWTH 5 DAYS Performed at [facility], [place] [address], [place] [address]    Report Status 12/24/2017 FINAL  Final     Time coordinating discharge: 35 minutes  SIGNED:   Aline August, MD  [facility] 12/25/2017, 1:33 PM Pager: [phone]  If 7PM-7AM, please contact night-coverage [email or web address] Password [email or web address]

## 2017-12-25 NOTE — Telephone Encounter (Signed)
-----   Message from Unk Lightning, [place] sent at 12/25/2017  1:23 PM EDT ----- Regarding: Benefit from [facility] forrest referral Pt will need follow up appt in 3-4 weeks in clinic with Dr. Christella Hartigan or myself.  Also, patient would benefit from referral to [facility] for gastroparesis. Delle Andrzejewski can you see if we can get this going for her?    Thanks- Hyacinth Meeker, PA-C

## 2017-12-25 NOTE — Progress Notes (Signed)
Progress Note   Subjective  Chief Complaint: Intractable nausea and vomiting, history of gastroparesis  Patient is much improved today, after use of suppositories she was able to eat dinner and breakfast this morning.  Continues with some vague epigastric discomfort, much improved.   Objective   Vital signs in last 24 hours: Temp:  [98.4 F (36.9 C)-99 F (37.2 C)] 98.4 F (36.9 C) (09/27 0434) Pulse Rate:  [97-111] 111 (09/27 0434) Resp:  [18] 18 (09/27 0434) BP: (92-151)/(65-99) 151/99 (09/27 0434) SpO2:  [97 %-100 %] 97 % (09/27 0434) Last BM Date: 12/22/17 General:    AA female in NAD Heart:  Regular rate and rhythm; no murmurs Lungs: Respirations even and unlabored, lungs CTA bilaterally Abdomen:  Soft, mild epigastric ttp and nondistended. Normal bowel sounds. Extremities:  Without edema. Neurologic:  Alert and oriented,  grossly normal neurologically. Psych:  Cooperative. Normal mood and affect.  Intake/Output from previous day: 09/26 0701 - 09/27 0700 In: 1534.2 [I.V.:1534.2] Out: 1000 [Urine:1000]  Lab Results: Recent Labs    12/24/17 0355 12/25/17 0428  WBC 4.3 5.7  HGB 10.4* 10.1*  HCT 30.3* 29.6*  PLT 235 232   BMET Recent Labs    12/23/17 0328  12/24/17 0355 12/24/17 1426 12/25/17 0428  NA 145  --  138  --  140  K 2.3*   < > 2.9* 3.3* 3.6  CL 104  --  98  --  101  CO2 29  --  27  --  29  GLUCOSE 108*  --  142*  --  163*  BUN 8  --  <5*  --  5*  CREATININE 0.49  --  0.47  --  0.41*  CALCIUM 8.7*  --  8.5*  --  8.4*   < > = values in this interval not displayed.   LFT Recent Labs    12/24/17 0355 12/25/17 0428  PROT 6.4* 6.2*  ALBUMIN 3.2* 3.1*  AST 16 19  ALT 11 13  ALKPHOS 62 64  BILITOT 1.6* 1.3*  BILIDIR 0.2  --    PT/INR No results for input(s): LABPROT, INR in the last 72 hours.  Studies/Results: Ct Angio Abd/pel W/ And/or W/o  Result Date: 12/23/2017 CLINICAL DATA:  diabetes mellitus type 1, diabetic gastroparesis  and cyclic vomiting syndrome.Pt. C/o diffuse abdominal pain and back pain x's 1 week, nausea,vomiting, diarrheaNegative Urine pregnancy test EXAM: CTA ABDOMEN AND PELVIS WITH CONTRAST TECHNIQUE: Multidetector CT imaging of the abdomen and pelvis was performed using the standard protocol during bolus administration of intravenous contrast. Multiplanar reconstructed images and MIPs were obtained and reviewed to evaluate the vascular anatomy. CONTRAST:  168m ISOVUE-370 IOPAMIDOL (ISOVUE-370) INJECTION 76% COMPARISON:  12/18/2017 and previous FINDINGS: VASCULAR Aorta: Normal caliber aorta without aneurysm, dissection, vasculitis or significant stenosis. Celiac: Patent without evidence of aneurysm, dissection, vasculitis or significant stenosis. SMA: Patent without evidence of aneurysm, dissection, vasculitis or significant stenosis. Renals: Both renal arteries are patent without evidence of aneurysm, dissection, vasculitis, fibromuscular dysplasia or significant stenosis. IMA: Patent without evidence of aneurysm, dissection, vasculitis or significant stenosis. Inflow: Patent without evidence of aneurysm, dissection, vasculitis or significant stenosis. Proximal Outflow: Bilateral common femoral and visualized portions of the superficial and profunda femoral arteries are patent without evidence of aneurysm, dissection, vasculitis or significant stenosis. Veins: Patent hepatic veins, portal vein, SMV, splenic vein, bilateral renal veins, IVC and iliac venous system. No venous pathology identified. Review of the MIP images confirms the above findings. NON-VASCULAR  Lower chest: No acute abnormality. Hepatobiliary: No focal liver abnormality is seen. Status post cholecystectomy. No biliary dilatation. Pancreas: Mild diffuse atrophy and ductal prominence, without focal lesion. Spleen: Normal in size without focal abnormality. Adrenals/Urinary Tract: Adrenal glands are unremarkable. Kidneys are normal, without renal calculi,  focal lesion, or hydronephrosis. Bladder is distended. Stomach/Bowel: Stomach is distended by ingested material. Small bowel is nondilated. Normal appendix. Colon is nondilated. There is suggestion of mild wall thickening in this hepatic flexure, transverse segment to the splenic flexure. Descending segment, sigmoid and rectum unremarkable. Lymphatic: No abdominal or pelvic adenopathy. Reproductive: Uterus and bilateral adnexa are unremarkable. Other: Trace pelvic ascites, which can be physiologic in a menstruating females. No free air. Musculoskeletal: Increase in diffuse mild body wall subcutaneous and mesenteric edema. No fracture or worrisome bone lesion. IMPRESSION: VASCULAR 1. No Significant abnormalities. NON-VASCULAR 1. Persistent mild wall thickening suspected in the transverse colon. 2. Marked distention of the urinary bladder. 3. New body wall and mesenteric edema. Electronically Signed   By: Lucrezia Europe M.D.   On: 12/23/2017 14:21       Assessment / Plan:   Assessment: 1.  Gastroparesis: With intractable nausea and vomiting, better overnight after use of promethazine suppositories, able to eat dinner and breakfast, will need to check QTC to ensure that this is safe for her to continue 2.  Epigastric pain 3. Diarrhea: chronic, fecal elastase pending  Plan: 1.  Continue current medications including promethazine suppositories daily/twice daily pending results we will repeat EKG today to ensure QTC has not changed 2.  Pancreatic fecal elastase still needs collecting 3.  Please await any further recommendations from Dr. Rush Landmark later today  Thank you for kind consultation.   LOS: 4 days   Levin Erp  12/25/2017, 10:16 AM

## 2017-12-31 ENCOUNTER — Telehealth: Payer: Self-pay

## 2017-12-31 NOTE — Telephone Encounter (Signed)
Received fax from [facility], Margaretha Seeds RN, requesting the last 2 office visits. Faxed over today to [phone]

## 2018-01-11 ENCOUNTER — Telehealth: Payer: Self-pay | Admitting: Gastroenterology

## 2018-01-11 ENCOUNTER — Telehealth: Payer: Self-pay | Admitting: Endocrinology

## 2018-01-11 DIAGNOSIS — K3184 Gastroparesis: Secondary | ICD-10-CM

## 2018-01-11 NOTE — Telephone Encounter (Signed)
Dr.A - Ok for pt to have 90 day supply of Remeron? Ok for 1 refill as wel? thanks

## 2018-01-11 NOTE — Telephone Encounter (Signed)
Patient stated that there pharmacy put in a request to refill LANTUS 100 UNIT/ML injection. Please Advise.

## 2018-01-11 NOTE — Telephone Encounter (Signed)
[name] is the patient taking this and has she found it helpful? Recent notes reviewed - looks like she is being referred to [facility] for motility specialty care

## 2018-01-12 ENCOUNTER — Other Ambulatory Visit: Payer: Self-pay

## 2018-01-12 MED ORDER — LANTUS 100 UNIT/ML ~~LOC~~ SOLN: 12.0000 [IU] | Freq: Every day | SUBCUTANEOUS | 0 refills | Status: DC

## 2018-01-12 MED ORDER — MIRTAZAPINE 15 MG PO TABS: 15.0000 mg | ORAL_TABLET | Freq: Every day | ORAL | 1 refills | Status: DC

## 2018-01-12 NOTE — Telephone Encounter (Signed)
Spoke to pt.  She finds the Remeron very helpful with the nausea and allows her to eat. She takes it at bedtime. She would like a 90 day refill. Rx sent to [facility].She has an appt with Hyacinth Meeker on Monday, 01-18-18. She does not intend to switch GI practices.

## 2018-01-12 NOTE — Telephone Encounter (Signed)
Please refill x 1 Ov is due  

## 2018-01-12 NOTE — Addendum Note (Signed)
Addended by: Cooper Render on: 01/12/2018 05:39 PM   Modules accepted: Orders

## 2018-01-12 NOTE — Telephone Encounter (Signed)
Last ov 05/07/17 was instructed to return in 6 weeks, ok to fill?

## 2018-01-12 NOTE — Telephone Encounter (Signed)
Refill sent to [email or web address] appt needed for any further refills

## 2018-01-13 ENCOUNTER — Emergency Department (HOSPITAL_COMMUNITY)
Admission: EM | Admit: 2018-01-13 | Discharge: 2018-01-13 | Disposition: A | Discharge status: Home / Self Care | Payer: 59 | Attending: Emergency Medicine | Admitting: Emergency Medicine

## 2018-01-13 ENCOUNTER — Other Ambulatory Visit: Payer: Self-pay

## 2018-01-13 ENCOUNTER — Encounter (HOSPITAL_COMMUNITY): Payer: Self-pay | Admitting: Emergency Medicine

## 2018-01-13 DIAGNOSIS — R112 Nausea with vomiting, unspecified: Secondary | ICD-10-CM | POA: Diagnosis not present

## 2018-01-13 DIAGNOSIS — Z794 Long term (current) use of insulin: Secondary | ICD-10-CM | POA: Diagnosis not present

## 2018-01-13 DIAGNOSIS — R197 Diarrhea, unspecified: Secondary | ICD-10-CM | POA: Diagnosis not present

## 2018-01-13 DIAGNOSIS — I10 Essential (primary) hypertension: Secondary | ICD-10-CM | POA: Insufficient documentation

## 2018-01-13 DIAGNOSIS — E109 Type 1 diabetes mellitus without complications: Secondary | ICD-10-CM | POA: Diagnosis not present

## 2018-01-13 LAB — CBC
HCT: 32.9 % — ABNORMAL LOW (ref 36.0–46.0)
Hemoglobin: 10.3 g/dL — ABNORMAL LOW (ref 12.0–15.0)
MCH: 28.1 pg (ref 26.0–34.0)
MCHC: 31.3 g/dL (ref 30.0–36.0)
MCV: 89.9 fL (ref 80.0–100.0)
Platelets: 325 10*3/uL (ref 150–400)
RBC: 3.66 MIL/uL — ABNORMAL LOW (ref 3.87–5.11)
RDW: 12.7 % (ref 11.5–15.5)
WBC: 4.4 10*3/uL (ref 4.0–10.5)
nRBC: 0 % (ref 0.0–0.2)

## 2018-01-13 LAB — COMPREHENSIVE METABOLIC PANEL
ALT: 19 U/L (ref 0–44)
AST: 19 U/L (ref 15–41)
Albumin: 3.2 g/dL — ABNORMAL LOW (ref 3.5–5.0)
Alkaline Phosphatase: 79 U/L (ref 38–126)
Anion gap: 8 (ref 5–15)
BUN: 10 mg/dL (ref 6–20)
CO2: 33 mmol/L — ABNORMAL HIGH (ref 22–32)
Calcium: 9.1 mg/dL (ref 8.9–10.3)
Chloride: 99 mmol/L (ref 98–111)
Creatinine, Ser: 0.47 mg/dL (ref 0.44–1.00)
GFR calc Af Amer: >60 mL/min (ref >60)
GFR calc non Af Amer: >60 mL/min (ref >60)
Glucose, Bld: 201 mg/dL — ABNORMAL HIGH (ref 70–99)
Potassium: 4.1 mmol/L (ref 3.5–5.1)
Sodium: 140 mmol/L (ref 135–145)
Total Bilirubin: 0.4 mg/dL (ref 0.3–1.2)
Total Protein: 7.5 g/dL (ref 6.5–8.1)

## 2018-01-13 LAB — I-STAT BETA HCG BLOOD, ED (MC, WL, AP ONLY): I-stat hCG, quantitative: <5 m[IU]/mL (ref <5)

## 2018-01-13 LAB — LIPASE, BLOOD: Lipase: 24 U/L (ref 11–51)

## 2018-01-13 MED ORDER — METOCLOPRAMIDE HCL 5 MG/ML IJ SOLN
10.0000 mg | Freq: Once | INTRAMUSCULAR | Status: AC
  Administered 2018-01-13: 10 mg via INTRAVENOUS
  Filled 2018-01-13: qty 2

## 2018-01-13 MED ORDER — SODIUM CHLORIDE 0.9 % IV BOLUS
2000.0000 mL | Freq: Once | INTRAVENOUS | Status: AC
  Administered 2018-01-13: 2000 mL via INTRAVENOUS

## 2018-01-13 MED ORDER — HALOPERIDOL LACTATE 5 MG/ML IJ SOLN
2.0000 mg | Freq: Once | INTRAMUSCULAR | Status: AC
  Administered 2018-01-13: 2 mg via INTRAVENOUS
  Filled 2018-01-13: qty 1

## 2018-01-13 MED ORDER — FAMOTIDINE IN NACL 20-0.9 MG/50ML-% IV SOLN
20.0000 mg | Freq: Once | INTRAVENOUS | Status: AC
  Administered 2018-01-13: 20 mg via INTRAVENOUS
  Filled 2018-01-13: qty 50

## 2018-01-13 MED ORDER — LORAZEPAM 1 MG PO TABS: 1.0000 mg | ORAL_TABLET | Freq: Once | ORAL | Status: DC

## 2018-01-13 NOTE — ED Provider Notes (Signed)
[facility]-EMERGENCY DEPT Provider Note   CSN: [id number] Arrival date & time: 01/13/18  1923     History   Chief Complaint Chief Complaint  Patient presents with  . Emesis  . Diarrhea    HPI [name] is a 27 y.o. female.   27 year old female with history of insulin-dependent diabetes, fibromyalgia, gastroparesis, hypertension presents to the emergency department for evaluation of nausea and vomiting.  She states that she awoke from sleep this morning with nausea and sharp, burning pain in her epigastrium.  She has had multiple episodes of vomiting today.  Has tried ODT Zofran as well as a Phenergan suppository without relief of her symptoms.  Denies any blood in her emesis.  Has been having watery, nonbloody diarrhea.  States that her symptoms feel similar to when she was admitted approximately 1 month ago with exacerbation of gastroparesis.  States that she has been compliant with all of her daily medications.  Is followed by Dr. Adela Lank of gastroenterology.     Past Medical History:  Diagnosis Date  . Allergy   . Anemia   . Anxiety   . Blood transfusion without reported diagnosis    Dec 2018  . Cataract    right eye  . Depression   . Diabetes type 1, uncontrolled (HCC) 11/14/2011   Since age 52  . Fibromyalgia   . Gastroparesis   . GERD (gastroesophageal reflux disease)   . Hypertension   . Infection    UTI April 2016    Patient Active Problem List   Diagnosis Date Noted  . DKA (diabetic ketoacidoses) (HCC) 07/27/2017  . Normocytic anemia 07/10/2017  . SIRS (systemic inflammatory response syndrome) (HCC) 06/23/2017  . Hypokalemia 06/23/2017  . Normocytic normochromic anemia 06/23/2017  . GERD (gastroesophageal reflux disease) 06/23/2017  . Intractable nausea and vomiting 04/11/2017  . Nausea and vomiting 04/10/2017  . Essential hypertension   . Colitis   . Abscess and cellulitis of gluteal region 03/24/2017  . Cellulitis and  abscess of buttock 03/23/2017  . Malnutrition of moderate degree 03/19/2017  . Anasarca 03/16/2017  . 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 Diarrhea 02/27/2017  . Lower GI bleed 02/27/2017  . DKA (diabetic ketoacidosis) (HCC) 02/26/2017  . Acute metabolic encephalopathy   . DKA, type 1 (HCC) 06/11/2013  . Orthostatic hypotension dysautonomic syndrome (HCC) 04/28/2013  . Body aches 04/28/2013  . Orthostatic hypotension 04/28/2013  . Diabetes mellitus type 1 (HCC) 04/28/2013  . Protein-calorie malnutrition, severe (HCC) 04/28/2013  . Noncompliance with diabetes treatment 09/01/2012  . Dyspnea 02/28/2012  . Sinus tachycardia 02/28/2012  . Hyperglycemia without ketosis 02/28/2012  . Chest pain 12/04/2011  . Insulin dependent diabetes mellitus (HCC) 11/14/2011  . Hypertension associated with diabetes (HCC) 11/14/2011  . Gastroparesis 11/14/2011  . Hyponatremia 09/19/2011  . Chronic abdominal pain 09/18/2011  . Metabolic alkalosis 09/18/2011  . Diabetic hyperosmolar non-ketotic state (HCC) 09/18/2011  . DIAB W/UNSPEC COMP TYPE I [JUV TYPE] UNCNTRL 06/04/2010  . CONSTIPATION 01/01/2010  . RECTAL PAIN 01/01/2010    Past Surgical History:  Procedure Laterality Date  . ANKLE SURGERY    . CHOLECYSTECTOMY  11/15/2011   Procedure: LAPAROSCOPIC CHOLECYSTECTOMY WITH INTRAOPERATIVE CHOLANGIOGRAM;  Surgeon: [name], MD;  Location: [facility];  Service: General;  Laterality: N/A;  . COLONOSCOPY    . COLONOSCOPY WITH PROPOFOL N/A 06/27/2017   Procedure: COLONOSCOPY WITH PROPOFOL;  Surgeon: Rachael Fee, MD;  Location: [facility];  Service:  Endoscopy;  Laterality: N/A;  . ESOPHAGOGASTRODUODENOSCOPY  12/03/2011   Procedure: ESOPHAGOGASTRODUODENOSCOPY (EGD);  Surgeon: Theda Belfast, MD;  Location: [facility];  Service: Endoscopy;  Laterality: N/A;  . FLEXIBLE SIGMOIDOSCOPY N/A 03/10/2017   Procedure: FLEXIBLE SIGMOIDOSCOPY;  Surgeon: Jeani Hawking,  MD;  Location: [facility];  Service: Endoscopy;  Laterality: N/A;  . INCISION AND DRAINAGE PERIRECTAL ABSCESS N/A 03/01/2017   Procedure: IRRIGATION AND DEBRIDEMENT PERIRECTAL ABSCESS;  Surgeon: Ovidio Kin, MD;  Location: [facility];  Service: General;  Laterality: N/A;  . IRRIGATION AND DEBRIDEMENT BUTTOCKS N/A 03/23/2017   Procedure: IRRIGATION AND DEBRIDEMENT BUTTOCKS, SETON PLACEMENT;  Surgeon: Romie Levee, MD;  Location: [facility];  Service: General;  Laterality: N/A;  . LAPAROSCOPY  11/23/2011   Procedure: LAPAROSCOPY DIAGNOSTIC;  Surgeon: Mariella Saa, MD;  Location: [facility];  Service: General;  Laterality: N/A;  . SIGMOIDOSCOPY    . UPPER GASTROINTESTINAL ENDOSCOPY    . WISDOM TOOTH EXTRACTION       OB History    Gravida  1   Para  1   Term      Preterm  1   AB      Living  1     SAB      TAB      Ectopic      Multiple  0   Live Births  1            Home Medications    Prior to Admission medications   Medication Sig Start Date End Date Taking? Authorizing Provider  acetaminophen (TYLENOL) 500 MG tablet Take 1,000 mg by mouth daily as needed for moderate pain.    Yes [provider]  diclofenac sodium (VOLTAREN) 1 % GEL Apply 2 g topically 4 (four) times daily. 08/01/17  Yes Mikhail, [name], DO  dicyclomine (BENTYL) 20 MG tablet Take 1 tablet (20 mg total) by mouth 2 (two) times daily. Patient taking differently: Take 20 mg by mouth 2 (two) times daily as needed for spasms.  12/25/17  Yes Glade Lloyd, MD  insulin lispro (HUMALOG) 100 UNIT/ML injection Inject 0-0.09 mLs (0-9 Units total) into the skin 3 (three) times daily with meals. 12/25/17  Yes Glade Lloyd, MD  LANTUS 100 UNIT/ML injection Inject 0.12 mLs (12 Units total) into the skin daily. Patient taking differently: Inject 12 Units into the skin at bedtime.  01/12/18  Yes Romero Belling, MD  metoCLOPramide (REGLAN) 10 MG tablet Take 1 tablet (10 mg total) by mouth every 6 (six) hours  as needed for nausea or vomiting. 12/25/17  Yes Glade Lloyd, MD  mirtazapine (REMERON) 15 MG tablet Take 1 tablet (15 mg total) by mouth at bedtime. 01/12/18  Yes Armbruster, Willaim Rayas, MD  ondansetron (ZOFRAN ODT) 4 MG disintegrating tablet Take 1 tablet (4 mg total) by mouth every 8 (eight) hours as needed for nausea or vomiting. 12/25/17  Yes Glade Lloyd, MD  pantoprazole (PROTONIX) 40 MG tablet Take 1 tablet (40 mg total) by mouth 2 (two) times daily. 12/25/17  Yes Glade Lloyd, MD  promethazine (PHENERGAN) 25 MG suppository Place 1 suppository (25 mg total) rectally every 12 (twelve) hours as needed for refractory nausea / vomiting. 12/25/17  Yes Glade Lloyd, MD  sucralfate (CARAFATE) 1 g tablet Take 1 tablet (1 g total) by mouth 4 (four) times daily -  with meals and at bedtime. 09/17/17  Yes Mancel Bale, MD  citalopram (CELEXA) 20 MG tablet Take 1 tablet (20 mg total) by mouth daily.  Patient not taking: Reported on 01/13/2018 07/13/17   Shon Hale, MD  ferrous sulfate 325 (65 FE) MG tablet Take 1 tablet (325 mg total) by mouth 2 (two) times daily with a meal. Patient not taking: Reported on 01/13/2018 05/13/17   Hetty Blend L, NP-C  losartan (COZAAR) 100 MG tablet Take 1 tablet (100 mg total) by mouth daily. Patient not taking: Reported on 01/13/2018 12/26/17   Glade Lloyd, MD  oxyCODONE-acetaminophen (PERCOCET/ROXICET) 5-325 MG tablet Take 1 tablet by mouth every 6 (six) hours as needed for severe pain. Patient not taking: Reported on 01/13/2018 12/25/17   Glade Lloyd, MD    Family History Family History  Problem Relation Age of Onset  . Diabetes Mother   . Hypertension Father   . Colon cancer Paternal Grandmother        pt thinks PGM was dx in her 55's  . Esophageal cancer Neg Hx   . Liver cancer Neg Hx   . Pancreatic cancer Neg Hx   . Stomach cancer Neg Hx   . Rectal cancer Neg Hx     Social History Social History   Tobacco Use  . Smoking status: Never  Smoker  . Smokeless tobacco: Never Used  Substance Use Topics  . Alcohol use: No    Alcohol/week: 0.0 standard drinks    Frequency: Never  . Drug use: No     Allergies   Other and Lactose intolerance (gi)   Review of Systems Review of Systems Ten systems reviewed and are negative for acute change, except as noted in the HPI.    Physical Exam Updated Vital Signs BP (!) 115/92 (BP Location: Left Arm)   Pulse 100   Temp 98.7 F (37.1 C) (Oral)   Resp 15   SpO2 100%   Physical Exam  Constitutional: She is oriented to person, place, and time. She appears well-developed and well-nourished. No distress.  Appears chronically ill, but nontoxic.  HENT:  Head: Normocephalic and atraumatic.  Dry mucous membranes  Eyes: Conjunctivae and EOM are normal. No scleral icterus.  Neck: Normal range of motion.  Cardiovascular: Regular rhythm and intact distal pulses.  Mild tachycardia  Pulmonary/Chest: Effort normal. No respiratory distress.  Respirations even and unlabored  Abdominal:  Abdomen soft, nondistended.  No palpable masses.  There is focal tenderness in the epigastrium.  No peritoneal signs or guarding.  Musculoskeletal: Normal range of motion.  Neurological: She is alert and oriented to person, place, and time. She exhibits normal muscle tone. Coordination normal.  Skin: Skin is warm and dry. No rash noted. She is not diaphoretic. No erythema. No pallor.  Psychiatric: She has a normal mood and affect. Her behavior is normal.  Nursing note and vitals reviewed.    ED Treatments / Results  Labs (all labs ordered are listed, but only abnormal results are displayed) Labs Reviewed  COMPREHENSIVE METABOLIC PANEL - Abnormal; Notable for the following components:      Result Value   CO2 33 (*)    Glucose, Bld 201 (*)    Albumin 3.2 (*)    All other components within normal limits  CBC - Abnormal; Notable for the following components:   RBC 3.66 (*)    Hemoglobin 10.3 (*)      HCT 32.9 (*)    All other components within normal limits  LIPASE, BLOOD  I-STAT BETA HCG BLOOD, ED ([facility], [facility], [facility] ONLY)    EKG None  Radiology No results found.  Procedures Procedures (including  critical care time)  Medications Ordered in ED Medications  sodium chloride 0.9 % bolus 2,000 mL (0 mLs Intravenous Stopped 01/13/18 2318)  metoCLOPramide (REGLAN) injection 10 mg (10 mg Intravenous Given 01/13/18 2221)  famotidine (PEPCID) IVPB 20 mg premix (0 mg Intravenous Stopped 01/13/18 2318)  haloperidol lactate (HALDOL) injection 2 mg (2 mg Intravenous Given 01/13/18 2224)    11:00 PM Approached by nurse as patient expressed desire to leave the ED.  I went to reevaluate the patient and she appeared very anxious.  She states that she is "not feeling bad", but does not want to be here.  She states that she gets very anxious when in hospitals.  Requesting discharge.  Has not had any vomiting since arrival.   Initial Impression / Assessment and Plan / ED Course  I have reviewed the triage vital signs and the nursing notes.  Pertinent labs & imaging results that were available during my care of the patient were reviewed by me and considered in my medical decision making (see chart for details).     27 year old female presents to the ED for complaints of nausea, vomiting, diarrhea.  She was admitted for similar complaints approximately 1 month ago.  History of gastroparesis and states that her symptoms today feel similar to this.  She has tried her home Zofran as well as Phenergan.  Given Reglan, Pepcid, Haldol in the emergency department for symptomatic management.  Also ordered to receive 2 L of IV fluids.  Part way through ED hospitalization, patient abruptly decided that she wanted to leave the department.  I went to try and encourage the patient to stay for continued management.  She states that she is feeling fine and wants to follow-up with her doctors outpatient.  She has had a  generally reassuring work-up today.  Her labs are consistent with baseline.  She has no fever or leukocytosis.  No vomiting since initial exam.  I do not believe discharge at this time is unreasonable.  The patient was given a tablet of Ativan for obvious anxiety.  She was encouraged to follow-up with her gastroenterologist regarding her visit.  Return precautions discussed and provided. Patient discharged in stable condition with no unaddressed concerns.   Final Clinical Impressions(s) / ED Diagnoses   Final diagnoses:  Nausea vomiting and diarrhea    ED Discharge Orders    None       Antony Madura, PA-C 01/14/18 1610    Tilden Fossa, MD 01/17/18 [phone]

## 2018-01-13 NOTE — Discharge Instructions (Signed)
Continue your home prescribed medications.  You may return to the emergency department if symptoms persist or worsen.

## 2018-01-13 NOTE — ED Notes (Signed)
Pt made aware that urine collection is needed  

## 2018-01-13 NOTE — ED Triage Notes (Signed)
Patient c/o N/V/D, abdominal pain, and back pain today.

## 2018-01-13 NOTE — ED Notes (Signed)
Pt asked to speak to the PA because she wants to go home. Pa made aware and at bedside

## 2018-01-18 ENCOUNTER — Ambulatory Visit (INDEPENDENT_AMBULATORY_CARE_PROVIDER_SITE_OTHER): Payer: 59 | Admitting: Physician Assistant

## 2018-01-18 ENCOUNTER — Encounter: Payer: Self-pay | Admitting: Physician Assistant

## 2018-01-18 ENCOUNTER — Telehealth: Payer: Self-pay

## 2018-01-18 VITALS — BP 152/90 | HR 79 | Ht 67.0 in | Wt 136.0 lb

## 2018-01-18 DIAGNOSIS — K3184 Gastroparesis: Secondary | ICD-10-CM

## 2018-01-18 DIAGNOSIS — R1013 Epigastric pain: Secondary | ICD-10-CM

## 2018-01-18 NOTE — Progress Notes (Signed)
Agree with assessment and plan as outlined. Very difficult situation, severe gastroparesis, for which Reglan has not helped. We discussed previously using Domperidone but QTc on multiple EKG's is > 450, thus we have not used it. Agree with referral to motility specialty care at this point at [facility]. Could consider trial of Prucalopride for which there is recent evidence of benefit for gastroparesis, the problem with this is it will make her chronic diarrhea worse and I don't know how well she would tolerate that. Will await second opinion from [facility].

## 2018-01-18 NOTE — Telephone Encounter (Signed)
Appointment made with Dr. Jacinto Reap for 03/22/18 at 8:30 a.m. At [facility].  Patients numbers not working numbers.  Letter mailed regarding appointment.

## 2018-01-18 NOTE — Patient Instructions (Addendum)
If you are age 28 or older, your body mass index should be between 23-30. Your Body mass index is 21.3 kg/m. If this is out of the aforementioned range listed, please consider follow up with your Primary Care Provider.  If you are age 21 or younger, your body mass index should be between 19-25. Your Body mass index is 21.3 kg/m. If this is out of the aformentioned range listed, please consider follow up with your Primary Care Provider.   Continue Carafate. Take an hour before or two hours after eating and other medications.  You are being referred to [facility] and Gastric Motility Specialist.  We will contact you with an appointment.  Follow up with Dr. Adela Lank on April 01, 2018 at 8:30 am  Thank you for choosing me and [facility].   Hyacinth Meeker, PA-C

## 2018-01-18 NOTE — Progress Notes (Signed)
Chief Complaint: Gastroparesis  HPI:    Mrs. Galeno is a 27 year old female with a history of diabetes and gastroparesis, hypertension, chronic diarrhea, perirectal/perianal abscess, who follows with Dr. Havery Moros and presents to clinic today for follow-up after recently being seen in the ER.    10/02/2017 office visit with Dr. Havery Moros to discuss her chronic GI issues.  She had a gastric emptying study in 2013 which showed gastroparesis and EGD in 2013 which was not completed due to retained food.  Chronic issues with nausea, vomiting and epigastric pain.  Had been on Reglan in the past for her gastroparesis.  Had been admitted in December for a prolonged hospital course in which she had severe malnourishment with very low albumin and anasarca and as ascites, treated empirically for C. difficile, given Creon and tried on Imodium and Lomotil.  Readmitted in March with worsening abdominal pain and diarrhea.  Colonoscopy performed by Dr. Ardis Hughs which is normal.  No evidence of microscopic colitis and no evidence of Crohn's disease.  Frequent loose stools managing with Imodium roughly 1 to 2/day.  Was on Cholestyramine but not certain if it was helping at all.  She had not been taking Creon.  Described vomiting 4 days out of the week.  Epigastric pain worse with eating.  At that time is recommended she have an EGD.  Was recommended she stay on a liquid diet.  She was placed on a trial of Protonix 40 mg daily as well as scheduled on Phenergan 12.5 mg every 8 hours and Zofran as needed.  There is discussed a trial Domperidone may be used moving forward.  She had sample sent for pancreatic elastase to assess for pancreatic insufficiency.  Imodium was increased to 3 times a day.    10/05/2017 EGD normal with a medium amount of food in the stomach consistent with gastroparesis.    01/11/2018 patient expressed that the Remeron was very helpful with nausea and mild her to eat she took it at that time.    01/13/2018  ER visit for nausea, vomiting abdominal pain.  Labs including CBC, CMP, lipase were essentially normal.  Patient was given Reglan injection, famotidine IV and Haldol.  She then left.    Today, patient explains that she continues with her chronic symptoms, currently she is not feeling excessively bad.  She does talk me through her medication regimen.  Typically in the morning when she wakes up she takes a Phenergan with Sucralfate and Pepcid at the same time.  Patient will then have another dose of Sucralfate in the afternoon about 6 hours later and then at bedtime will take Phenergan, Protonix, Mirtazapine, Pepcid and Carafate together.  She discusses that occasionally she will just have breakthrough symptoms of some nausea and vomiting and will use a Phenergan suppository.  Continues with chronic epigastric pain and some diarrhea.    Denies fever, chills, weight loss or symptoms that awaken her from sleep.   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 Allergy   . Anemia   . Anxiety   . Blood transfusion without reported diagnosis  Dec 2018  . Cataract    right eye  . Depression   . Diabetes type 1, uncontrolled ([place]) 11/14/2011   Since age 40  . Fibromyalgia   . Gastroparesis   . GERD (gastroesophageal reflux disease)   . Hypertension   . Infection    UTI April 2016    Past Surgical History:  Procedure Laterality Date  . ANKLE SURGERY    . CHOLECYSTECTOMY  11/15/2011   Procedure: LAPAROSCOPIC CHOLECYSTECTOMY WITH INTRAOPERATIVE CHOLANGIOGRAM;  Surgeon: [name], MD;  Location: [facility]  ORS;  Service: General;  Laterality: N/A;  . COLONOSCOPY    . COLONOSCOPY WITH PROPOFOL N/A 06/27/2017   Procedure: COLONOSCOPY WITH PROPOFOL;  Surgeon: Milus Banister, MD;  Location: [facility];  Service: Endoscopy;  Laterality: N/A;  . ESOPHAGOGASTRODUODENOSCOPY  12/03/2011   Procedure: ESOPHAGOGASTRODUODENOSCOPY (EGD);  Surgeon: Beryle Beams, MD;  Location: [facility];  Service: Endoscopy;  Laterality: N/A;  . FLEXIBLE SIGMOIDOSCOPY N/A 03/10/2017   Procedure: FLEXIBLE SIGMOIDOSCOPY;  Surgeon: Carol Ada, MD;  Location: [facility];  Service: Endoscopy;  Laterality: N/A;  . INCISION AND DRAINAGE PERIRECTAL ABSCESS N/A 03/01/2017   Procedure: IRRIGATION AND DEBRIDEMENT PERIRECTAL ABSCESS;  Surgeon: Alphonsa Overall, MD;  Location: [facility];  Service: General;  Laterality: N/A;  . IRRIGATION AND DEBRIDEMENT BUTTOCKS N/A 03/23/2017   Procedure: IRRIGATION AND DEBRIDEMENT BUTTOCKS, SETON PLACEMENT;  Surgeon: [name], MD;  Location: [facility];  Service: General;  Laterality: N/A;  . LAPAROSCOPY  11/23/2011   Procedure: LAPAROSCOPY DIAGNOSTIC;  Surgeon: Edward Jolly, MD;  Location: [facility];  Service: General;  Laterality: N/A;  . SIGMOIDOSCOPY    . UPPER GASTROINTESTINAL ENDOSCOPY    . WISDOM TOOTH EXTRACTION      Current Outpatient Medications  Medication Sig Dispense Refill  . acetaminophen (TYLENOL) 500 MG tablet Take 1,000 mg by mouth daily as needed for moderate pain.     . citalopram (CELEXA) 20 MG tablet Take 1 tablet (20 mg total) by mouth daily. 30 tablet 1  . diclofenac sodium (VOLTAREN) 1 % GEL Apply 2 g topically 4 (four) times daily. 100 g 0  . dicyclomine (BENTYL) 20 MG tablet Take 1 tablet (20 mg total) by mouth 2 (two) times daily. (Patient taking differently: Take 20 mg by mouth 2 (two) times daily as needed for spasms. ) 20 tablet 0  . ferrous sulfate 325 (65 FE) MG tablet Take 1 tablet (325 mg total) by mouth 2 (two) times daily with a meal. 60 tablet 0  .  insulin lispro (HUMALOG) 100 UNIT/ML injection Inject 0-0.09 mLs (0-9 Units total) into the skin 3 (three) times daily with meals.    Marland Kitchen LANTUS 100 UNIT/ML injection Inject 0.12 mLs (12 Units total) into the skin daily. (Patient taking differently: Inject 12 Units into the skin at bedtime. ) 10 mL 0  . metoCLOPramide (REGLAN) 10 MG tablet Take 1 tablet (10 mg total) by mouth every 6 (six) hours as needed for nausea or vomiting. 20 tablet 0  . mirtazapine (REMERON) 15 MG tablet Take 1 tablet (15 mg total) by mouth at bedtime. 90 tablet 1  . ondansetron (ZOFRAN ODT) 4 MG disintegrating tablet Take 1 tablet (4 mg total) by mouth every 8 (eight) hours as needed for nausea or vomiting. 20 tablet 0  . pantoprazole (PROTONIX) 40 MG tablet Take 1 tablet (40 mg total) by mouth 2 (two) times daily. 60 tablet 0  . promethazine (PHENERGAN) 25 MG suppository Place 1 suppository (25  mg total) rectally every 12 (twelve) hours as needed for refractory nausea / vomiting. 12 each 0  . sucralfate (CARAFATE) 1 g tablet Take 1 tablet (1 g total) by mouth 4 (four) times daily -  with meals and at bedtime. 120 tablet 0   No current facility-administered medications for this visit.     Allergies as of 01/18/2018 - Review Complete 01/13/2018  Allergen Reaction Noted  . Other Anaphylaxis 12/13/2011  . Lactose intolerance (gi) Diarrhea 01/22/2015    Family History  Problem Relation Age of Onset  . Diabetes Mother   . Hypertension Father   . Colon cancer Paternal Grandmother        pt thinks PGM was dx in her 62's  . Esophageal cancer Neg Hx   . Liver cancer Neg Hx   . Pancreatic cancer Neg Hx   . Stomach cancer Neg Hx   . Rectal cancer Neg Hx     Social History   Socioeconomic History  . Marital status: Married    Spouse name: sergio  . Number of children: 0  . Years of education: college  . Highest education level: Not on file  Occupational History  . Occupation: Publishing rights manager call center     Employer: [employer]  . Financial resource strain: Not on file  . Food insecurity:    Worry: Not on file    Inability: Not on file  . Transportation needs:    Medical: Not on file    Non-medical: Not on file  Tobacco Use  . Smoking status: Never Smoker  . Smokeless tobacco: Never Used  Substance and Sexual Activity  . Alcohol use: No    Alcohol/week: 0.0 standard drinks    Frequency: Never  . Drug use: No  . Sexual activity: Not Currently    Partners: Male    Birth control/protection: None  Lifestyle  . Physical activity:    Days per week: Not on file    Minutes per session: Not on file  . Stress: Not on file  Relationships  . Social connections:    Talks on phone: Not on file    Gets together: Not on file    Attends religious service: Not on file    Active member of club or organization: Not on file    Attends meetings of clubs or organizations: Not on file    Relationship status: Not on file  . Intimate partner violence:    Fear of current or ex partner: Not on file    Emotionally abused: Not on file    Physically abused: Not on file    Forced sexual activity: Not on file  Other Topics Concern  . Not on file  Social History Narrative  . Not on file    Review of Systems:    Constitutional: No fever or chills Cardiovascular: No chest pain  Respiratory: No SOB  Gastrointestinal: See HPI and otherwise negative   Physical Exam:  Vital signs: BP (!) 152/90   Pulse 79   Ht 5' 7"  (1.702 m)   Wt 136 lb (61.7 kg)   BMI 21.30 kg/m   Constitutional:   Pleasant, thin appearing, AA female appears to be in NAD, Well developed, Well nourished, alert and cooperative Respiratory: Respirations even and unlabored. Lungs clear to auscultation bilaterally.   No wheezes, crackles, or rhonchi.  Cardiovascular: Normal S1, S2. No MRG. Regular rate and rhythm. No peripheral edema, cyanosis or pallor.  Gastrointestinal:  Soft, nondistended,  moderate epigastric  ttp. No rebound or guarding. Normal bowel sounds. No appreciable masses or hepatomegaly. Psychiatric: Demonstrates good judgement and reason without abnormal affect or behaviors.  RELEVANT LABS AND IMAGING: CBC    Component Value Date/Time   WBC 4.4 01/13/2018 1942   RBC 3.66 (L) 01/13/2018 1942   HGB 10.3 (L) 01/13/2018 1942   HCT 32.9 (L) 01/13/2018 1942   PLT 325 01/13/2018 1942   MCV 89.9 01/13/2018 1942   MCH 28.1 01/13/2018 1942   MCHC 31.3 01/13/2018 1942   RDW 12.7 01/13/2018 1942   LYMPHSABS 3.7 12/25/2017 0428   MONOABS 0.3 12/25/2017 0428   EOSABS 0.1 12/25/2017 0428   BASOSABS 0.0 12/25/2017 0428    CMP     Component Value Date/Time   NA 140 01/13/2018 1942   K 4.1 01/13/2018 1942   CL 99 01/13/2018 1942   CO2 33 (H) 01/13/2018 1942   GLUCOSE 201 (H) 01/13/2018 1942   BUN 10 01/13/2018 1942   CREATININE 0.47 01/13/2018 1942   CREATININE 0.48 (L) 11/07/2014 1321   CALCIUM 9.1 01/13/2018 1942   PROT 7.5 01/13/2018 1942   ALBUMIN 3.2 (L) 01/13/2018 1942   AST 19 01/13/2018 1942   ALT 19 01/13/2018 1942   ALKPHOS 79 01/13/2018 1942   BILITOT 0.4 01/13/2018 1942   GFRNONAA >60 01/13/2018 1942   GFRAA >60 01/13/2018 1942    Assessment: 1.  Gastroparesis: Chronic nausea and vomiting as well as epigastric pain, multiple normal EGDs other than findings of gastroparesis, patient does fairly well on a regimen of Phenergan, Sucralfate, Pepcid and Mirtazapine in the evenings, though does have exacerbations and has been hospitalized twice this year  Plan: 1.  Discussed with patient that she can continue her current medication regimen but would recommend that she use her Carafate an hour before or 2 hours after eating and all of her other medicines.  This was written out for her today. 2.  Did go ahead and re-refer the patient to [facility] and their gastric motility specialist.  Explained that she can go see them and then still follow with [place] if she would like but would  appreciate their opinion especially in possible gastric pacemaker versus other modalities including Domperidone going forward.  She acknowledged and would like this referral. 3.  Patient to follow in clinic with Dr. Havery Moros in 2 to 3 months.  Ellouise Newer, PA-C [facility] 01/18/2018, 8:51 AM  Cc: No ref. provider found

## 2018-02-11 ENCOUNTER — Other Ambulatory Visit: Payer: Self-pay | Admitting: *Deleted

## 2018-02-11 MED ORDER — PROMETHAZINE HCL 12.5 MG PO TABS: 12.5000 mg | ORAL_TABLET | Freq: Three times a day (TID) | ORAL | 0 refills | Status: DC | PRN

## 2018-02-17 ENCOUNTER — Other Ambulatory Visit: Payer: Self-pay

## 2018-02-17 ENCOUNTER — Encounter (HOSPITAL_COMMUNITY): Payer: Self-pay | Admitting: Emergency Medicine

## 2018-02-17 ENCOUNTER — Inpatient Hospital Stay (HOSPITAL_COMMUNITY)
Admission: EM | Admit: 2018-02-17 | Discharge: 2018-02-20 | DRG: 637 | Disposition: A | Discharge status: Home / Self Care | Payer: 59 | Attending: Internal Medicine | Admitting: Internal Medicine

## 2018-02-17 DIAGNOSIS — R109 Unspecified abdominal pain: Secondary | ICD-10-CM

## 2018-02-17 DIAGNOSIS — E1159 Type 2 diabetes mellitus with other circulatory complications: Secondary | ICD-10-CM | POA: Diagnosis present

## 2018-02-17 DIAGNOSIS — K219 Gastro-esophageal reflux disease without esophagitis: Secondary | ICD-10-CM | POA: Diagnosis present

## 2018-02-17 DIAGNOSIS — Z681 Body mass index (BMI) 19 or less, adult: Secondary | ICD-10-CM

## 2018-02-17 DIAGNOSIS — R112 Nausea with vomiting, unspecified: Secondary | ICD-10-CM | POA: Diagnosis present

## 2018-02-17 DIAGNOSIS — K3184 Gastroparesis: Secondary | ICD-10-CM | POA: Diagnosis not present

## 2018-02-17 DIAGNOSIS — Z79899 Other long term (current) drug therapy: Secondary | ICD-10-CM

## 2018-02-17 DIAGNOSIS — I10 Essential (primary) hypertension: Secondary | ICD-10-CM | POA: Diagnosis not present

## 2018-02-17 DIAGNOSIS — E1010 Type 1 diabetes mellitus with ketoacidosis without coma: Secondary | ICD-10-CM | POA: Diagnosis present

## 2018-02-17 DIAGNOSIS — Z91018 Allergy to other foods: Secondary | ICD-10-CM

## 2018-02-17 DIAGNOSIS — M797 Fibromyalgia: Secondary | ICD-10-CM | POA: Diagnosis present

## 2018-02-17 DIAGNOSIS — E739 Lactose intolerance, unspecified: Secondary | ICD-10-CM | POA: Diagnosis present

## 2018-02-17 DIAGNOSIS — E1069 Type 1 diabetes mellitus with other specified complication: Secondary | ICD-10-CM | POA: Diagnosis present

## 2018-02-17 DIAGNOSIS — D649 Anemia, unspecified: Secondary | ICD-10-CM | POA: Diagnosis present

## 2018-02-17 DIAGNOSIS — Z833 Family history of diabetes mellitus: Secondary | ICD-10-CM

## 2018-02-17 DIAGNOSIS — E43 Unspecified severe protein-calorie malnutrition: Secondary | ICD-10-CM | POA: Diagnosis present

## 2018-02-17 DIAGNOSIS — E1043 Type 1 diabetes mellitus with diabetic autonomic (poly)neuropathy: Secondary | ICD-10-CM | POA: Diagnosis present

## 2018-02-17 DIAGNOSIS — E1110 Type 2 diabetes mellitus with ketoacidosis without coma: Secondary | ICD-10-CM | POA: Diagnosis present

## 2018-02-17 DIAGNOSIS — F329 Major depressive disorder, single episode, unspecified: Secondary | ICD-10-CM | POA: Diagnosis present

## 2018-02-17 DIAGNOSIS — E109 Type 1 diabetes mellitus without complications: Secondary | ICD-10-CM | POA: Diagnosis present

## 2018-02-17 DIAGNOSIS — G8929 Other chronic pain: Secondary | ICD-10-CM | POA: Diagnosis present

## 2018-02-17 DIAGNOSIS — I152 Hypertension secondary to endocrine disorders: Secondary | ICD-10-CM | POA: Diagnosis present

## 2018-02-17 DIAGNOSIS — R5383 Other fatigue: Secondary | ICD-10-CM | POA: Diagnosis not present

## 2018-02-17 LAB — CBG MONITORING, ED
Glucose-Capillary: 464 mg/dL — ABNORMAL HIGH (ref 70–99)
Glucose-Capillary: 529 mg/dL (ref 70–99)

## 2018-02-17 LAB — BASIC METABOLIC PANEL
Anion gap: 23 — ABNORMAL HIGH (ref 5–15)
BUN: 26 mg/dL — ABNORMAL HIGH (ref 6–20)
CO2: 18 mmol/L — ABNORMAL LOW (ref 22–32)
Calcium: 9.8 mg/dL (ref 8.9–10.3)
Chloride: 94 mmol/L — ABNORMAL LOW (ref 98–111)
Creatinine, Ser: 0.91 mg/dL (ref 0.44–1.00)
GFR calc Af Amer: >60 mL/min (ref >60)
GFR calc non Af Amer: >60 mL/min (ref >60)
Glucose, Bld: 508 mg/dL (ref 70–99)
Potassium: 4 mmol/L (ref 3.5–5.1)
Sodium: 135 mmol/L (ref 135–145)

## 2018-02-17 MED ORDER — METOCLOPRAMIDE HCL 5 MG/ML IJ SOLN
10.0000 mg | Freq: Once | INTRAMUSCULAR | Status: AC
  Administered 2018-02-17: 10 mg via INTRAVENOUS

## 2018-02-17 MED ORDER — LORAZEPAM 2 MG/ML IJ SOLN
1.0000 mg | Freq: Once | INTRAMUSCULAR | Status: AC
  Administered 2018-02-17: 1 mg via INTRAVENOUS
  Filled 2018-02-17: qty 1

## 2018-02-17 MED ORDER — METOCLOPRAMIDE HCL 5 MG/ML IJ SOLN: 10.0000 mg | Freq: Once | INTRAMUSCULAR | Status: DC

## 2018-02-17 MED ORDER — METOCLOPRAMIDE HCL 5 MG/ML IJ SOLN
10.0000 mg | Freq: Once | INTRAMUSCULAR | Status: DC
  Filled 2018-02-17: qty 2

## 2018-02-17 MED ORDER — DEXTROSE-NACL 5-0.45 % IV SOLN: INTRAVENOUS | Status: DC

## 2018-02-17 MED ORDER — SODIUM CHLORIDE 0.9 % IV BOLUS
1000.0000 mL | Freq: Once | INTRAVENOUS | Status: AC
  Administered 2018-02-17: 1000 mL via INTRAVENOUS

## 2018-02-17 MED ORDER — INSULIN REGULAR(HUMAN) IN NACL 100-0.9 UT/100ML-% IV SOLN
INTRAVENOUS | Status: DC
  Administered 2018-02-17: 4.7 [IU]/h via INTRAVENOUS
  Filled 2018-02-17: qty 100

## 2018-02-17 NOTE — ED Notes (Addendum)
Vomit noted on floor of triage room, despite having bucket from home available.

## 2018-02-17 NOTE — ED Triage Notes (Signed)
Patient c/o high CBG with N/V and abdominal pain since yesterday.

## 2018-02-17 NOTE — H&P (Signed)
[name] LKT:[id number] DOB: 07-Apr-1990 DOA: 02/17/2018     PCP: Patient, No Pcp Per   Outpatient Specialists:   [name]  Dr.  Chryl Heck) Armbruster    Patient arrived to ER on 02/17/18 at 1906  Patient coming from: home Lives with family Chief Complaint:  Chief Complaint  Patient presents with  . Hyperglycemia  . Emesis    HPI: [name] is a 27 y.o. female with medical history significant of DM1 gastroparesis hypertension, chronic diarrhea, perirectal/perianal abscess,    Presented with   N/V abdominal pain, pain all over for the past 2 or 3 days unable to tolerate home medications vomited while waiting on room.  No fever no chills. Reports sensation of shortness of breath due to pain.  denies any sick contacts.  She reports chronic frequent diarrhea 4-5 BM a day Reports she has been without Lantus for 1 week she has been waiting for mail order pharmacy Severe history of gastroparesis Reglan has not helped in the past in the past have seen by GI suggested to try to use domperidone but could not tolerate secondary to prolongation of QT recommended to refer to [facility]  Regarding pertinent Chronic problems: Regarding history of gastroparesis  as per GI note: She had a gastric emptying study in 2013 which showed gastroparesis and EGD in 2013 which was not completed due to retained food.  Chronic issues with nausea, vomiting and epigastric pain.  Had been on Reglan in the past for her gastroparesis.  Had been admitted in December for a prolonged hospital course in which she had severe malnourishment with very low albumin and anasarca and as ascites, treated empirically for C. difficile, given Creon and tried on Imodium and Lomotil.  Readmitted in March with worsening abdominal pain and diarrhea.  Colonoscopy performed by Dr. Ardis Hughs which is normal.  No evidence of microscopic colitis and no evidence of Crohn's disease.  Frequent loose stools managing with  Imodium roughly 1 to 2/day.  Was on Cholestyramine but not certain if it was helping at all.  She had not been taking Creon.  Described vomiting 4 days out of the week.  Epigastric pain worse with eating.  At that time is recommended she have an EGD.  Was recommended she stay on a liquid diet.  She was placed on a trial of Protonix 40 mg daily as well as scheduled on Phenergan 12.5 mg every 8 hours and Zofran as needed.  There is discussed a trial Domperidone may be used moving forward.  She had sample sent for pancreatic elastase to assess for pancreatic insufficiency.  Imodium was increased to 3 times a day.    10/05/2017 EGD normal with a medium amount of food in the stomach consistent with gastroparesis.    01/11/2018 patient expressed that the Remeron was very helpful with nausea and mild her to eat she took it at that time.    01/13/2018 ER visit for nausea, vomiting abdominal pain.  Labs including CBC, CMP, lipase were essentially normal.  Patient was given Reglan injection, famotidine IV and Haldol.  She then left.  While in ER:  The following Work up has been ordered so far:  Orders Placed This Encounter  Procedures  . Basic metabolic panel  . CBC  . Urinalysis, Routine w reflex microscopic  . Hepatic function panel  . Saline Lock IV, Maintain IV access  . Initiate Carrier Fluid Protocol  . Consult to hospitalist  .  CBG monitoring, ED  . I-Stat beta hCG blood, ED  . CBG monitoring, ED  . ED EKG    Following Medications were ordered in ER: Medications  sodium chloride 0.9 % bolus 1,000 mL (1,000 mLs Intravenous New Bag/Given 02/17/18 2326)  dextrose 5 %-0.45 % sodium chloride infusion (has no administration in time range)  insulin regular, human (MYXREDLIN) 100 units/ 100 mL infusion (4.7 Units/hr Intravenous New Bag/Given 02/17/18 2327)  metoCLOPramide (REGLAN) injection 10 mg (10 mg Intravenous Given 02/17/18 2208)  LORazepam (ATIVAN) injection 1 mg (1 mg Intravenous Given  02/17/18 2322)    Significant initial  Findings: Abnormal Labs Reviewed  BASIC METABOLIC PANEL - Abnormal; Notable for the following components:      Result Value   Chloride 94 (*)    CO2 18 (*)    Glucose, Bld 508 (*)    BUN 26 (*)    Anion gap 23 (*)    All other components within normal limits  CBG MONITORING, ED - Abnormal; Notable for the following components:   Glucose-Capillary 464 (*)    All other components within normal limits  CBG MONITORING, ED - Abnormal; Notable for the following components:   Glucose-Capillary 529 (*)    All other components within normal limits    Lactic Acid, Venous    Component Value Date/Time   LATICACIDVEN 1.2 07/28/2017 0813    Na 135 K 4.0 AG 23 Cr   why she stable,  Lab Results  Component Value Date   CREATININE 0.91 02/17/2018   CREATININE 0.47 01/13/2018   CREATININE 0.41 (L) 12/25/2017    WBC  7.5  HG/HCT  stable,      Component Value Date/Time   HGB 10.3 (L) 01/13/2018 1942   HCT 32.9 (L) 01/13/2018 1942     Troponin (Point of Care Test) No results for input(s): TROPIPOC in the last 72 hours.     UA   ordered     ECG:  Personally reviewed by me showing: HR : 122 Rhythm:  Sinus tachycardia   no evidence of ischemic changes QTC 473     ED Triage Vitals [02/17/18 1925]  Enc Vitals Group     BP (!) 124/93     Pulse Rate (!) 114     Resp (!) 24     Temp 97.9 F (36.6 C)     Temp Source Oral     SpO2 100 %     Weight      Height      Head Circumference      Peak Flow      Pain Score 10     Pain Loc      Pain Edu?      Excl. in [place]?   KZSW(10)@       Latest  Blood pressure (!) 143/97, pulse (!) 114, temperature 97.9 F (36.6 C), temperature source Oral, resp. rate (!) 22, SpO2 97 %.     Hospitalist was called for admission for dehydration, gastroparesis and DKA   Review of Systems:    Pertinent positives include: fatigue abdominal pain, nausea, vomiting, diarrhea,   Constitutional:  No  weight loss, night sweats, Fevers, chills, , weight loss  HEENT:  No headaches, Difficulty swallowing,Tooth/dental problems,Sore throat,  No sneezing, itching, ear ache, nasal congestion, post nasal drip,  Cardio-vascular:  No chest pain, Orthopnea, PND, anasarca, dizziness, palpitations.no Bilateral lower extremity swelling  GI:  No heartburn, indigestion, change in bowel habits, loss of appetite, melena,  blood in stool, hematemesis Resp:  no shortness of breath at rest. No dyspnea on exertion, No excess mucus, no productive cough, No non-productive cough, No coughing up of blood.No change in color of mucus.No wheezing. Skin:  no rash or lesions. No jaundice GU:  no dysuria, change in color of urine, no urgency or frequency. No straining to urinate.  No flank pain.  Musculoskeletal:  No joint pain or no joint swelling. No decreased range of motion. No back pain.  Psych:  No change in mood or affect. No depression or anxiety. No memory loss.  Neuro: no localizing neurological complaints, no tingling, no weakness, no double vision, no gait abnormality, no slurred speech, no confusion  All systems reviewed and apart from [place] all are negative  Past Medical History:   Past Medical History:  Diagnosis Date  . Allergy   . Anemia   . Anxiety   . Blood transfusion without reported diagnosis    Dec 2018  . Cataract    right eye  . Depression   . Diabetes type 1, uncontrolled ([place]) 11/14/2011   Since age 7  . Fibromyalgia   . Gastroparesis   . GERD (gastroesophageal reflux disease)   . Hypertension   . Infection    UTI April 2016      Past Surgical History:  Procedure Laterality Date  . ANKLE SURGERY    . CHOLECYSTECTOMY  11/15/2011   Procedure: LAPAROSCOPIC CHOLECYSTECTOMY WITH INTRAOPERATIVE CHOLANGIOGRAM;  Surgeon: [name], MD;  Location: [facility];  Service: General;  Laterality: N/A;  . COLONOSCOPY    . COLONOSCOPY WITH PROPOFOL N/A 06/27/2017   Procedure:  COLONOSCOPY WITH PROPOFOL;  Surgeon: Milus Banister, MD;  Location: [facility];  Service: Endoscopy;  Laterality: N/A;  . ESOPHAGOGASTRODUODENOSCOPY  12/03/2011   Procedure: ESOPHAGOGASTRODUODENOSCOPY (EGD);  Surgeon: Beryle Beams, MD;  Location: [facility];  Service: Endoscopy;  Laterality: N/A;  . FLEXIBLE SIGMOIDOSCOPY N/A 03/10/2017   Procedure: FLEXIBLE SIGMOIDOSCOPY;  Surgeon: Carol Ada, MD;  Location: [facility];  Service: Endoscopy;  Laterality: N/A;  . INCISION AND DRAINAGE PERIRECTAL ABSCESS N/A 03/01/2017   Procedure: IRRIGATION AND DEBRIDEMENT PERIRECTAL ABSCESS;  Surgeon: Alphonsa Overall, MD;  Location: [facility];  Service: General;  Laterality: N/A;  . IRRIGATION AND DEBRIDEMENT BUTTOCKS N/A 03/23/2017   Procedure: IRRIGATION AND DEBRIDEMENT BUTTOCKS, SETON PLACEMENT;  Surgeon: [name], MD;  Location: [facility];  Service: General;  Laterality: N/A;  . LAPAROSCOPY  11/23/2011   Procedure: LAPAROSCOPY DIAGNOSTIC;  Surgeon: Edward Jolly, MD;  Location: [facility];  Service: General;  Laterality: N/A;  . SIGMOIDOSCOPY    . UPPER GASTROINTESTINAL ENDOSCOPY    . WISDOM TOOTH EXTRACTION      Social History:  Ambulatory   independently      reports that she has never smoked. She has never used smokeless tobacco. She reports that she does not drink alcohol or use drugs.     Family History:   Family History  Problem Relation Age of Onset  . Diabetes Mother   . Hypertension Father   . Colon cancer Paternal Grandmother        pt thinks PGM was dx in her 67's  . Esophageal cancer Neg Hx   . Liver cancer Neg Hx   . Pancreatic cancer Neg Hx   . Stomach cancer Neg Hx   . Rectal cancer Neg Hx     Allergies: Allergies  Allergen Reactions  . Other Anaphylaxis    Reaction to  [place] nuts   . Lactose Intolerance (Gi) Diarrhea     Prior to Admission medications   Medication Sig Start Date End Date Taking? Authorizing Provider  acetaminophen (TYLENOL) 500 MG  tablet Take 1,000 mg by mouth daily as needed for moderate pain.     [provider]  citalopram (CELEXA) 20 MG tablet Take 1 tablet (20 mg total) by mouth daily. 07/13/17   Roxan Hockey, MD  diclofenac sodium (VOLTAREN) 1 % GEL Apply 2 g topically 4 (four) times daily. 08/01/17   Mikhail, Velta Addison, DO  dicyclomine (BENTYL) 20 MG tablet Take 1 tablet (20 mg total) by mouth 2 (two) times daily. Patient taking differently: Take 20 mg by mouth 2 (two) times daily as needed for spasms.  12/25/17   Aline August, MD  ferrous sulfate 325 (65 FE) MG tablet Take 1 tablet (325 mg total) by mouth 2 (two) times daily with a meal. 05/13/17   Henson, Vickie L, NP-C  insulin lispro (HUMALOG) 100 UNIT/ML injection Inject 0-0.09 mLs (0-9 Units total) into the skin 3 (three) times daily with meals. 12/25/17   Aline August, MD  LANTUS 100 UNIT/ML injection Inject 0.12 mLs (12 Units total) into the skin daily. Patient taking differently: Inject 12 Units into the skin at bedtime.  01/12/18   Renato Shin, MD  metoCLOPramide (REGLAN) 10 MG tablet Take 1 tablet (10 mg total) by mouth every 6 (six) hours as needed for nausea or vomiting. 12/25/17   Aline August, MD  mirtazapine (REMERON) 15 MG tablet Take 1 tablet (15 mg total) by mouth at bedtime. 01/12/18   Armbruster, Carlota Raspberry, MD  ondansetron (ZOFRAN ODT) 4 MG disintegrating tablet Take 1 tablet (4 mg total) by mouth every 8 (eight) hours as needed for nausea or vomiting. 12/25/17   Aline August, MD  pantoprazole (PROTONIX) 40 MG tablet Take 1 tablet (40 mg total) by mouth 2 (two) times daily. 12/25/17   Aline August, MD  promethazine (PHENERGAN) 12.5 MG tablet Take 1 tablet (12.5 mg total) by mouth every 8 (eight) hours as needed for nausea or vomiting. 02/11/18   Armbruster, Carlota Raspberry, MD  promethazine (PHENERGAN) 25 MG suppository Place 1 suppository (25 mg total) rectally every 12 (twelve) hours as needed for refractory nausea / vomiting. 12/25/17   Aline August, MD  sucralfate (CARAFATE) 1 g tablet Take 1 tablet (1 g total) by mouth 4 (four) times daily -  with meals and at bedtime. 09/17/17   Daleen Bo, MD   Physical Exam: Blood pressure (!) 143/97, pulse (!) 114, temperature 97.9 F (36.6 C), temperature source Oral, resp. rate (!) 22, SpO2 97 %. 1. General: tearful   Chronically ill  -appearing 2. Psychological: Alert and  Oriented 3. Head/ENT:     Dry Mucous Membranes                          Head Non traumatic, neck supple                          Poor Dentition 4. SKIN:  decreased Skin turgor,  Skin clean Dry and intact no rash 5. Heart: Regular rate and rhythm no  Murmur, no Rub or gallop 6. Lungs:  Clear to auscultation bilaterally, no wheezes or crackles   7. Abdomen: Soft,  non-tender, Non distended bowel sounds present 8. Lower extremities: no clubbing, cyanosis, or  edema 9. Neurologically Grossly intact, moving  all 4 extremities equally  10. MSK: Normal range of motion   LABS:    No results for input(s): WBC, NEUTROABS, HGB, HCT, MCV, PLT in the last 168 hours. Basic Metabolic Panel: Recent Labs  Lab 02/17/18 1928  NA 135  K 4.0  CL 94*  CO2 18*  GLUCOSE 508*  BUN 26*  CREATININE 0.91  CALCIUM 9.8      No results for input(s): AST, ALT, ALKPHOS, BILITOT, PROT, ALBUMIN in the last 168 hours. No results for input(s): LIPASE, AMYLASE in the last 168 hours. No results for input(s): AMMONIA in the last 168 hours.    HbA1C: No results for input(s): HGBA1C in the last 72 hours. CBG: Recent Labs  Lab 02/17/18 2157 02/17/18 2321  GLUCAP 464* 529*      Urine analysis:    Component Value Date/Time   COLORURINE YELLOW 12/18/2017 1636   APPEARANCEUR CLEAR 12/18/2017 1636   LABSPEC 1.024 12/18/2017 1636   PHURINE 5.0 12/18/2017 1636   GLUCOSEU >=500 (A) 12/18/2017 1636   HGBUR MODERATE (A) 12/18/2017 1636   BILIRUBINUR NEGATIVE 12/18/2017 1636   BILIRUBINUR negative 01/02/2015 1717   KETONESUR 20  (A) 12/18/2017 1636   PROTEINUR 100 (A) 12/18/2017 1636   UROBILINOGEN 0.2 01/13/2015 0223   NITRITE NEGATIVE 12/18/2017 1636   LEUKOCYTESUR NEGATIVE 12/18/2017 1636       Cultures:    Component Value Date/Time   SDES BLOOD RIGHT ANTECUBITAL 12/19/2017 1859   SPECREQUEST  12/19/2017 1859    BOTTLES DRAWN AEROBIC ONLY Blood Culture adequate volume   CULT  12/19/2017 1859    NO GROWTH 5 DAYS Performed at [facility], [place] [address] [place],  [address]    REPTSTATUS 12/24/2017 FINAL 12/19/2017 1859     Radiological Exams on Admission: No results found.  Chart has been reviewed    Assessment/Plan  27 y.o. female with medical history significant of DM1 gastroparesis hypertension, chronic diarrhea, perirectal/perianal abscess, Admitted for DKA and gastroparesis exacerbation  Present on Admission: . DKA (diabetic ketoacidoses) ([place]) in the setting of being out of Lantus for 1 week will admit per DKA protocol, obtain serial BMET, start on glucosestabalizer, aggressive IVF. Change IVF to D5 1/2Na after BG <250 . Will work up cause of DKA with CXR, ECG one set of cardiac enzymes, UA. Monitor in [place]. Replace potassium as needed.     . Gastroparesis -likely contributing to nausea and vomiting we will try to avoid narcotics, patient have not followed up with [facility] yet but will need to establish care.  Meanwhile can discuss with GI in a.m. to develop best plan of management.  For now continue Phenergan as needed Ativan and Reglan   . Chronic abdominal pain try to avoid narcotics as this can worsen gastroparesis . Hypertension associated with diabetes ([employer]) currently stable continue to monitor . Protein-calorie malnutrition, severe ([place]) we will need nutritional consult check prealbumin       . Intractable nausea and vomiting -setting of recurrent gastroparesis DKA.  Supportively managed will continue IV fluids rehydrate She noted slight streak of blood in volume  of vomitus no recurrence.  Continue to monitor for any sign of GI bleeding . Normocytic anemia currently stable continue to monitor . GERD (gastroesophageal reflux disease) restart Protonix when able to tolerate   Other plan as per orders.  DVT prophylaxis:  SCD      Code Status:  FULL CODE   as per patient   I had personally discussed  CODE STATUS with patient and family   Family Communication:   Family  at  Bedside  plan of care was discussed with  Husband   Disposition Plan:        To home once workup is complete and patient is stable                                      Nutrition    consulted                  Diabetes coordinator  Consults called: call GI in AM  Admission status:    Obs    Level of care       SDU tele indefinitely please discontinue once patient no longer qualifies        [name] 02/18/2018, 2:03 AM    [facility]  Pager [phone]   after 2 AM please page floor coverage PA If 7AM-7PM, please contact the day team taking care of the patient  [email or web address]  Password [email or web address]

## 2018-02-17 NOTE — ED Notes (Signed)
IV only able to draw 2cc of blood with IV start. Pediatric tubes of lavender and green sent to lab at this time.

## 2018-02-17 NOTE — ED Provider Notes (Addendum)
[place] DEPT Provider Note   CSN: [id number] Arrival date & time: 02/17/18  1906     History   Chief Complaint Chief Complaint  Patient presents with  . Hyperglycemia  . Emesis    HPI [name] is a 27 y.o. female.  HPI Patient presents with nausea and vomiting.  History of gastroparesis and diabetes.  Reportedly has been vomiting for the last couple days.  States the pain is severe.  States she is not sure if her medicines have been staying down.  She is tearful on my examination.  Somewhat difficult to get history from.  She says she thinks she saw some blood when she threw up. Past Medical History:  Diagnosis Date  . Allergy   . Anemia   . Anxiety   . Blood transfusion without reported diagnosis    Dec 2018  . Cataract    right eye  . Depression   . Diabetes type 1, uncontrolled ([place]) 11/14/2011   Since age 7  . Fibromyalgia   . Gastroparesis   . GERD (gastroesophageal reflux disease)   . Hypertension   . Infection    UTI April 2016    Patient Active Problem List   Diagnosis Date Noted  . DKA (diabetic ketoacidoses) ([place]) 07/27/2017  . Normocytic anemia 07/10/2017  . SIRS (systemic inflammatory response syndrome) ([place]) 06/23/2017  . Hypokalemia 06/23/2017  . Normocytic normochromic anemia 06/23/2017  . GERD (gastroesophageal reflux disease) 06/23/2017  . Intractable nausea and vomiting 04/11/2017  . Nausea and vomiting 04/10/2017  . Essential hypertension   . Colitis   . Abscess and cellulitis of gluteal region 03/24/2017  . Cellulitis and abscess of buttock 03/23/2017  . Malnutrition of moderate degree 03/19/2017  . Anasarca 03/16/2017  . 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 Diarrhea 02/27/2017  . Lower GI bleed 02/27/2017  . DKA (diabetic ketoacidosis) ([place]) 02/26/2017  . Acute metabolic encephalopathy   . DKA, type 1 ([place]) 06/11/2013  . Orthostatic hypotension  dysautonomic syndrome ([place]) 04/28/2013  . Body aches 04/28/2013  . Orthostatic hypotension 04/28/2013  . Diabetes mellitus type 1 ([place]) 04/28/2013  . Protein-calorie malnutrition, severe ([place]) 04/28/2013  . Noncompliance with diabetes treatment 09/01/2012  . Dyspnea 02/28/2012  . Sinus tachycardia 02/28/2012  . Hyperglycemia without ketosis 02/28/2012  . Chest pain 12/04/2011  . Insulin dependent diabetes mellitus ([place]) 11/14/2011  . Hypertension associated with diabetes ([place]) 11/14/2011  . Gastroparesis 11/14/2011  . Hyponatremia 09/19/2011  . Chronic abdominal pain 09/18/2011  . Metabolic alkalosis 01/60/1093  . Diabetic hyperosmolar non-ketotic state ([place]) 09/18/2011  . DIAB W/UNSPEC COMP TYPE I [JUV TYPE] UNCNTRL 06/04/2010  . CONSTIPATION 01/01/2010  . RECTAL PAIN 01/01/2010    Past Surgical History:  Procedure Laterality Date  . ANKLE SURGERY    . CHOLECYSTECTOMY  11/15/2011   Procedure: LAPAROSCOPIC CHOLECYSTECTOMY WITH INTRAOPERATIVE CHOLANGIOGRAM;  Surgeon: [name], MD;  Location: [facility];  Service: General;  Laterality: N/A;  . COLONOSCOPY    . COLONOSCOPY WITH PROPOFOL N/A 06/27/2017   Procedure: COLONOSCOPY WITH PROPOFOL;  Surgeon: Milus Banister, MD;  Location: [facility];  Service: Endoscopy;  Laterality: N/A;  . ESOPHAGOGASTRODUODENOSCOPY  12/03/2011   Procedure: ESOPHAGOGASTRODUODENOSCOPY (EGD);  Surgeon: Beryle Beams, MD;  Location: [facility];  Service: Endoscopy;  Laterality: N/A;  . FLEXIBLE SIGMOIDOSCOPY N/A 03/10/2017   Procedure: FLEXIBLE SIGMOIDOSCOPY;  Surgeon: Carol Ada, MD;  Location: [facility];  Service: Endoscopy;  Laterality: N/A;  .  INCISION AND DRAINAGE PERIRECTAL ABSCESS N/A 03/01/2017   Procedure: IRRIGATION AND DEBRIDEMENT PERIRECTAL ABSCESS;  Surgeon: Alphonsa Overall, MD;  Location: [facility];  Service: General;  Laterality: N/A;  . IRRIGATION AND DEBRIDEMENT BUTTOCKS N/A 03/23/2017   Procedure: IRRIGATION AND DEBRIDEMENT BUTTOCKS,  SETON PLACEMENT;  Surgeon: [name], MD;  Location: [facility];  Service: General;  Laterality: N/A;  . LAPAROSCOPY  11/23/2011   Procedure: LAPAROSCOPY DIAGNOSTIC;  Surgeon: Edward Jolly, MD;  Location: [facility];  Service: General;  Laterality: N/A;  . SIGMOIDOSCOPY    . UPPER GASTROINTESTINAL ENDOSCOPY    . WISDOM TOOTH EXTRACTION       OB History    Gravida  1   Para  1   Term      Preterm  1   AB      Living  1     SAB      TAB      Ectopic      Multiple  0   Live Births  1            Home Medications    Prior to Admission medications   Medication Sig Start Date End Date Taking? Authorizing Provider  acetaminophen (TYLENOL) 500 MG tablet Take 1,000 mg by mouth daily as needed for moderate pain.     [provider]  citalopram (CELEXA) 20 MG tablet Take 1 tablet (20 mg total) by mouth daily. 07/13/17   Roxan Hockey, MD  diclofenac sodium (VOLTAREN) 1 % GEL Apply 2 g topically 4 (four) times daily. 08/01/17   Mikhail, Velta Addison, DO  dicyclomine (BENTYL) 20 MG tablet Take 1 tablet (20 mg total) by mouth 2 (two) times daily. Patient taking differently: Take 20 mg by mouth 2 (two) times daily as needed for spasms.  12/25/17   Aline August, MD  ferrous sulfate 325 (65 FE) MG tablet Take 1 tablet (325 mg total) by mouth 2 (two) times daily with a meal. 05/13/17   Henson, Vickie L, NP-C  insulin lispro (HUMALOG) 100 UNIT/ML injection Inject 0-0.09 mLs (0-9 Units total) into the skin 3 (three) times daily with meals. 12/25/17   Aline August, MD  LANTUS 100 UNIT/ML injection Inject 0.12 mLs (12 Units total) into the skin daily. Patient taking differently: Inject 12 Units into the skin at bedtime.  01/12/18   Renato Shin, MD  metoCLOPramide (REGLAN) 10 MG tablet Take 1 tablet (10 mg total) by mouth every 6 (six) hours as needed for nausea or vomiting. 12/25/17   Aline August, MD  mirtazapine (REMERON) 15 MG tablet Take 1 tablet (15 mg total) by mouth at  bedtime. 01/12/18   Armbruster, Carlota Raspberry, MD  ondansetron (ZOFRAN ODT) 4 MG disintegrating tablet Take 1 tablet (4 mg total) by mouth every 8 (eight) hours as needed for nausea or vomiting. 12/25/17   Aline August, MD  pantoprazole (PROTONIX) 40 MG tablet Take 1 tablet (40 mg total) by mouth 2 (two) times daily. 12/25/17   Aline August, MD  promethazine (PHENERGAN) 12.5 MG tablet Take 1 tablet (12.5 mg total) by mouth every 8 (eight) hours as needed for nausea or vomiting. 02/11/18   Armbruster, Carlota Raspberry, MD  promethazine (PHENERGAN) 25 MG suppository Place 1 suppository (25 mg total) rectally every 12 (twelve) hours as needed for refractory nausea / vomiting. 12/25/17   Aline August, MD  sucralfate (CARAFATE) 1 g tablet Take 1 tablet (1 g total) by mouth 4 (four) times daily -  with meals  and at bedtime. 09/17/17   Daleen Bo, MD    Family History Family History  Problem Relation Age of Onset  . Diabetes Mother   . Hypertension Father   . Colon cancer Paternal Grandmother        pt thinks PGM was dx in her 38's  . Esophageal cancer Neg Hx   . Liver cancer Neg Hx   . Pancreatic cancer Neg Hx   . Stomach cancer Neg Hx   . Rectal cancer Neg Hx     Social History Social History   Tobacco Use  . Smoking status: Never Smoker  . Smokeless tobacco: Never Used  Substance Use Topics  . Alcohol use: No    Alcohol/week: 0.0 standard drinks    Frequency: Never  . Drug use: No     Allergies   Other and Lactose intolerance (gi)   Review of Systems Review of Systems  Constitutional: Positive for fatigue. Negative for appetite change.  Respiratory: Negative for shortness of breath.   Cardiovascular: Negative for chest pain.  Gastrointestinal: Positive for abdominal pain.  Genitourinary: Negative for dysuria.  Musculoskeletal: Negative for back pain.  Skin: Negative for rash.  Neurological: Negative for weakness.     Physical Exam Updated Vital Signs BP (!) 143/97 (BP  Location: Left Arm)   Pulse (!) 114   Temp 97.9 F (36.6 C) (Oral)   Resp (!) 22   SpO2 97%   Physical Exam  Constitutional: She appears well-developed.  Chronically ill-appearing.  HENT:  Head: Normocephalic.  Poor dentition  Eyes: Pupils are equal, round, and reactive to light.  Cardiovascular:  Tachycardia  Pulmonary/Chest: Effort normal.  Abdominal:  Diffusely soft.  Musculoskeletal: She exhibits no tenderness.  Neurological: She is alert.  Skin: Skin is warm.  Psychiatric:  Patient is tearful     ED Treatments / Results  Labs (all labs ordered are listed, but only abnormal results are displayed) Labs Reviewed  BASIC METABOLIC PANEL - Abnormal; Notable for the following components:      Result Value   Chloride 94 (*)    CO2 18 (*)    Glucose, Bld 508 (*)    BUN 26 (*)    Anion gap 23 (*)    All other components within normal limits  CBG MONITORING, ED - Abnormal; Notable for the following components:   Glucose-Capillary 464 (*)    All other components within normal limits  CBG MONITORING, ED - Abnormal; Notable for the following components:   Glucose-Capillary 529 (*)    All other components within normal limits  CBC  URINALYSIS, ROUTINE W REFLEX MICROSCOPIC  HEPATIC FUNCTION PANEL  BETA-HYDROXYBUTYRIC ACID  I-STAT BETA HCG BLOOD, ED ([facility], [facility], [facility] ONLY)    EKG EKG Interpretation  Date/Time:  Wednesday February 17 2018 23:35:31 EST Ventricular Rate:  122 PR Interval:    QRS Duration: 86 QT Interval:  332 QTC Calculation: 473 R Axis:   70 Text Interpretation:  Sinus tachycardia Multiple ventricular premature complexes Borderline T abnormalities, inferior leads Baseline wander in lead(s) I II III V1 Confirmed by Davonna Belling [id number]) on 02/17/2018 11:38:41 PM   Radiology No results found.  Procedures Procedures (including critical care time)  Medications Ordered in ED Medications  sodium chloride 0.9 % bolus 1,000 mL (1,000 mLs Intravenous  New Bag/Given 02/17/18 2326)  dextrose 5 %-0.45 % sodium chloride infusion (has no administration in time range)  insulin regular, human (MYXREDLIN) 100 units/ 100 mL infusion (4.7 Units/hr Intravenous New  Bag/Given 02/17/18 2327)  metoCLOPramide (REGLAN) injection 10 mg (10 mg Intravenous Given 02/17/18 2208)  LORazepam (ATIVAN) injection 1 mg (1 mg Intravenous Given 02/17/18 2322)     Initial Impression / Assessment and Plan / ED Course  I have reviewed the triage vital signs and the nursing notes.  Pertinent labs & imaging results that were available during my care of the patient were reviewed by me and considered in my medical decision making (see chart for details).     Patient presents with hyperglycemia nausea vomiting.  History of DKA and gastroparesis.  Appears to be the same today.  Sugar of 500.  Has a gap 23.  Bicarb of 18.  Continued vomiting in the ER. Has had prolonged QT at times in the past.  Will be somewhat careful with medications.  Reglan has not worked and will add Ativan.  Will admit to hospitalist.  Difficult blood drawn only had 2ccs of blood initially.  CRITICAL CARE Performed by: Davonna Belling Total critical care time: 30 minutes Critical care time was exclusive of separately billable procedures and treating other patients. Critical care was necessary to treat or prevent imminent or life-threatening deterioration. Critical care was time spent personally by me on the following activities: development of treatment plan with patient and/or surrogate as well as nursing, discussions with consultants, evaluation of patient's response to treatment, examination of patient, obtaining history from patient or surrogate, ordering and performing treatments and interventions, ordering and review of laboratory studies, ordering and review of radiographic studies, pulse oximetry and re-evaluation of patient's condition.   Final Clinical Impressions(s) / ED Diagnoses   Final  diagnoses:  Diabetic ketoacidosis without coma associated with type 1 diabetes mellitus [facility])  Gastroparesis    ED Discharge Orders    None       Davonna Belling, MD 02/17/18 2314    Davonna Belling, MD 02/17/18 6256    Davonna Belling, MD 02/17/18 [phone]

## 2018-02-17 NOTE — ED Notes (Signed)
Patient ambulatory to restroom with assistance of family.

## 2018-02-17 NOTE — ED Notes (Signed)
CBG check- 464 mg/l. Tresa Endo RN advised. [employer]

## 2018-02-18 ENCOUNTER — Encounter (HOSPITAL_COMMUNITY): Payer: Self-pay

## 2018-02-18 ENCOUNTER — Other Ambulatory Visit: Payer: Self-pay

## 2018-02-18 DIAGNOSIS — E1010 Type 1 diabetes mellitus with ketoacidosis without coma: Principal | ICD-10-CM

## 2018-02-18 LAB — URINALYSIS, ROUTINE W REFLEX MICROSCOPIC
Bacteria, UA: NONE SEEN
Bilirubin Urine: NEGATIVE
Glucose, UA: >=500 mg/dL — AB
Ketones, ur: 80 mg/dL — AB
Nitrite: NEGATIVE
Protein, ur: 100 mg/dL — AB
Specific Gravity, Urine: 1.022 (ref 1.005–1.030)
pH: 5 (ref 5.0–8.0)

## 2018-02-18 LAB — CBC
HCT: 33.7 % — ABNORMAL LOW (ref 36.0–46.0)
HCT: 36.7 % (ref 36.0–46.0)
Hemoglobin: 11.4 g/dL — ABNORMAL LOW (ref 12.0–15.0)
Hemoglobin: 12.3 g/dL (ref 12.0–15.0)
MCH: 27.8 pg (ref 26.0–34.0)
MCH: 28.3 pg (ref 26.0–34.0)
MCHC: 33.8 g/dL (ref 30.0–36.0)
MCHC: 33.5 g/dL (ref 30.0–36.0)
MCV: 82.2 fL (ref 80.0–100.0)
MCV: 84.4 fL (ref 80.0–100.0)
Platelets: UNDETERMINED 10*3/uL (ref 150–400)
Platelets: 243 10*3/uL (ref 150–400)
RBC: 4.1 MIL/uL (ref 3.87–5.11)
RBC: 4.35 MIL/uL (ref 3.87–5.11)
RDW: 11.6 % (ref 11.5–15.5)
RDW: 11.7 % (ref 11.5–15.5)
WBC: 7.5 10*3/uL (ref 4.0–10.5)
WBC: 6.4 10*3/uL (ref 4.0–10.5)
nRBC: 0 % (ref 0.0–0.2)
nRBC: 0 % (ref 0.0–0.2)

## 2018-02-18 LAB — CBG MONITORING, ED
Glucose-Capillary: 285 mg/dL — ABNORMAL HIGH (ref 70–99)
Glucose-Capillary: 371 mg/dL — ABNORMAL HIGH (ref 70–99)
Glucose-Capillary: 225 mg/dL — ABNORMAL HIGH (ref 70–99)
Glucose-Capillary: 456 mg/dL — ABNORMAL HIGH (ref 70–99)
Glucose-Capillary: 174 mg/dL — ABNORMAL HIGH (ref 70–99)
Glucose-Capillary: 189 mg/dL — ABNORMAL HIGH (ref 70–99)
Glucose-Capillary: 198 mg/dL — ABNORMAL HIGH (ref 70–99)
Glucose-Capillary: 191 mg/dL — ABNORMAL HIGH (ref 70–99)

## 2018-02-18 LAB — MAGNESIUM: Magnesium: 2 mg/dL (ref 1.7–2.4)

## 2018-02-18 LAB — GLUCOSE, CAPILLARY
Glucose-Capillary: 202 mg/dL — ABNORMAL HIGH (ref 70–99)
Glucose-Capillary: 219 mg/dL — ABNORMAL HIGH (ref 70–99)

## 2018-02-18 LAB — HEPATIC FUNCTION PANEL
ALT: 15 U/L (ref 0–44)
AST: 19 U/L (ref 15–41)
Albumin: 3.7 g/dL (ref 3.5–5.0)
Alkaline Phosphatase: 90 U/L (ref 38–126)
Bilirubin, Direct: 0.1 mg/dL (ref 0.0–0.2)
Indirect Bilirubin: 0.7 mg/dL (ref 0.3–0.9)
Total Bilirubin: 0.8 mg/dL (ref 0.3–1.2)
Total Protein: 8 g/dL (ref 6.5–8.1)

## 2018-02-18 LAB — COMPREHENSIVE METABOLIC PANEL
ALT: 16 U/L (ref 0–44)
AST: 21 U/L (ref 15–41)
Albumin: 4.4 g/dL (ref 3.5–5.0)
Alkaline Phosphatase: 98 U/L (ref 38–126)
Anion gap: 13 (ref 5–15)
BUN: 26 mg/dL — ABNORMAL HIGH (ref 6–20)
CO2: 25 mmol/L (ref 22–32)
Calcium: 9.3 mg/dL (ref 8.9–10.3)
Chloride: 102 mmol/L (ref 98–111)
Creatinine, Ser: 0.47 mg/dL (ref 0.44–1.00)
GFR calc Af Amer: >60 mL/min (ref >60)
GFR calc non Af Amer: >60 mL/min (ref >60)
Glucose, Bld: 225 mg/dL — ABNORMAL HIGH (ref 70–99)
Potassium: 3.8 mmol/L (ref 3.5–5.1)
Sodium: 140 mmol/L (ref 135–145)
Total Bilirubin: 0.7 mg/dL (ref 0.3–1.2)
Total Protein: 9.4 g/dL — ABNORMAL HIGH (ref 6.5–8.1)

## 2018-02-18 LAB — BASIC METABOLIC PANEL
Anion gap: 13 (ref 5–15)
Anion gap: 10 (ref 5–15)
BUN: 25 mg/dL — ABNORMAL HIGH (ref 6–20)
BUN: 23 mg/dL — ABNORMAL HIGH (ref 6–20)
CO2: 23 mmol/L (ref 22–32)
CO2: 25 mmol/L (ref 22–32)
Calcium: 8.8 mg/dL — ABNORMAL LOW (ref 8.9–10.3)
Calcium: 8.5 mg/dL — ABNORMAL LOW (ref 8.9–10.3)
Chloride: 105 mmol/L (ref 98–111)
Chloride: 104 mmol/L (ref 98–111)
Creatinine, Ser: 0.67 mg/dL (ref 0.44–1.00)
Creatinine, Ser: 0.54 mg/dL (ref 0.44–1.00)
GFR calc Af Amer: >60 mL/min (ref >60)
GFR calc Af Amer: >60 mL/min (ref >60)
GFR calc non Af Amer: >60 mL/min (ref >60)
GFR calc non Af Amer: >60 mL/min (ref >60)
Glucose, Bld: 274 mg/dL — ABNORMAL HIGH (ref 70–99)
Glucose, Bld: 220 mg/dL — ABNORMAL HIGH (ref 70–99)
Potassium: 3.4 mmol/L — ABNORMAL LOW (ref 3.5–5.1)
Potassium: 3.2 mmol/L — ABNORMAL LOW (ref 3.5–5.1)
Sodium: 141 mmol/L (ref 135–145)
Sodium: 139 mmol/L (ref 135–145)

## 2018-02-18 LAB — CK
Total CK: 42 U/L (ref 38–234)
Total CK: 42 U/L (ref 38–234)

## 2018-02-18 LAB — TSH: TSH: 0.596 u[IU]/mL (ref 0.350–4.500)

## 2018-02-18 LAB — PHOSPHORUS: Phosphorus: 2.4 mg/dL — ABNORMAL LOW (ref 2.5–4.6)

## 2018-02-18 LAB — PREALBUMIN: Prealbumin: 21 mg/dL (ref 18–38)

## 2018-02-18 LAB — BETA-HYDROXYBUTYRIC ACID: Beta-Hydroxybutyric Acid: 2.17 mmol/L — ABNORMAL HIGH (ref 0.05–0.27)

## 2018-02-18 MED ORDER — ONDANSETRON HCL 4 MG/2ML IJ SOLN
4.0000 mg | Freq: Four times a day (QID) | INTRAMUSCULAR | Status: DC | PRN
  Administered 2018-02-18 – 2018-02-19 (×4): 4 mg via INTRAVENOUS
  Filled 2018-02-18 (×4): qty 2

## 2018-02-18 MED ORDER — SODIUM CHLORIDE 0.9 % IV SOLN
INTRAVENOUS | Status: DC
  Administered 2018-02-18: 01:00:00 via INTRAVENOUS

## 2018-02-18 MED ORDER — HYOSCYAMINE SULFATE 0.125 MG SL SUBL
0.2500 mg | SUBLINGUAL_TABLET | Freq: Four times a day (QID) | SUBLINGUAL | Status: DC | PRN
  Administered 2018-02-19: 0.25 mg via SUBLINGUAL
  Filled 2018-02-18 (×3): qty 2

## 2018-02-18 MED ORDER — MIRTAZAPINE 15 MG PO TABS
15.0000 mg | ORAL_TABLET | Freq: Every day | ORAL | Status: DC
  Filled 2018-02-18: qty 1

## 2018-02-18 MED ORDER — PANTOPRAZOLE SODIUM 40 MG PO TBEC: 40.0000 mg | DELAYED_RELEASE_TABLET | Freq: Two times a day (BID) | ORAL | Status: DC

## 2018-02-18 MED ORDER — POTASSIUM CHLORIDE 10 MEQ/100ML IV SOLN
10.0000 meq | INTRAVENOUS | Status: AC
  Administered 2018-02-18 – 2018-02-19 (×5): 10 meq via INTRAVENOUS
  Filled 2018-02-18 (×5): qty 100

## 2018-02-18 MED ORDER — SUCRALFATE 1 G PO TABS
1.0000 g | ORAL_TABLET | Freq: Three times a day (TID) | ORAL | Status: DC
  Filled 2018-02-18 (×4): qty 1

## 2018-02-18 MED ORDER — PROMETHAZINE HCL 25 MG/ML IJ SOLN
12.5000 mg | Freq: Four times a day (QID) | INTRAMUSCULAR | Status: DC | PRN
  Administered 2018-02-18: 25 mg via INTRAVENOUS
  Administered 2018-02-19 (×2): 12.5 mg via INTRAVENOUS
  Administered 2018-02-19: 25 mg via INTRAVENOUS
  Filled 2018-02-18 (×4): qty 1

## 2018-02-18 MED ORDER — ACETAMINOPHEN 650 MG RE SUPP: 650.0000 mg | Freq: Four times a day (QID) | RECTAL | Status: DC | PRN

## 2018-02-18 MED ORDER — INSULIN GLARGINE 100 UNIT/ML ~~LOC~~ SOLN
15.0000 [IU] | SUBCUTANEOUS | Status: DC
  Administered 2018-02-19 – 2018-02-20 (×2): 15 [IU] via SUBCUTANEOUS
  Filled 2018-02-18 (×2): qty 0.15

## 2018-02-18 MED ORDER — INSULIN ASPART 100 UNIT/ML ~~LOC~~ SOLN
0.0000 [IU] | Freq: Three times a day (TID) | SUBCUTANEOUS | Status: DC
  Administered 2018-02-18: 2 [IU] via SUBCUTANEOUS
  Administered 2018-02-18: 3 [IU] via SUBCUTANEOUS
  Administered 2018-02-18: 2 [IU] via SUBCUTANEOUS
  Administered 2018-02-19: 5 [IU] via SUBCUTANEOUS
  Administered 2018-02-20: 1 [IU] via SUBCUTANEOUS
  Filled 2018-02-18: qty 1

## 2018-02-18 MED ORDER — POTASSIUM CHLORIDE CRYS ER 20 MEQ PO TBCR
40.0000 meq | EXTENDED_RELEASE_TABLET | Freq: Once | ORAL | Status: DC
  Filled 2018-02-18: qty 2

## 2018-02-18 MED ORDER — KETOROLAC TROMETHAMINE 15 MG/ML IJ SOLN
15.0000 mg | Freq: Three times a day (TID) | INTRAMUSCULAR | Status: DC | PRN
  Administered 2018-02-18: 15 mg via INTRAVENOUS
  Filled 2018-02-18: qty 1

## 2018-02-18 MED ORDER — INSULIN ASPART 100 UNIT/ML ~~LOC~~ SOLN
0.0000 [IU] | Freq: Every day | SUBCUTANEOUS | Status: DC
  Administered 2018-02-18: 2 [IU] via SUBCUTANEOUS

## 2018-02-18 MED ORDER — SODIUM CHLORIDE 0.9 % IV SOLN: INTRAVENOUS | Status: DC

## 2018-02-18 MED ORDER — POTASSIUM CHLORIDE 10 MEQ/100ML IV SOLN
10.0000 meq | INTRAVENOUS | Status: AC
  Administered 2018-02-18 (×2): 10 meq via INTRAVENOUS
  Filled 2018-02-18 (×2): qty 100

## 2018-02-18 MED ORDER — ONDANSETRON HCL 4 MG PO TABS: 4.0000 mg | ORAL_TABLET | Freq: Four times a day (QID) | ORAL | Status: DC | PRN

## 2018-02-18 MED ORDER — PROMETHAZINE HCL 25 MG RE SUPP
25.0000 mg | Freq: Two times a day (BID) | RECTAL | Status: DC | PRN
  Filled 2018-02-18: qty 1

## 2018-02-18 MED ORDER — ACETAMINOPHEN 325 MG PO TABS: 650.0000 mg | ORAL_TABLET | Freq: Four times a day (QID) | ORAL | Status: DC | PRN

## 2018-02-18 MED ORDER — SODIUM CHLORIDE 0.9 % IV SOLN
INTRAVENOUS | Status: DC
  Administered 2018-02-18 – 2018-02-20 (×6): via INTRAVENOUS

## 2018-02-18 MED ORDER — MORPHINE SULFATE (PF) 2 MG/ML IV SOLN
2.0000 mg | Freq: Once | INTRAVENOUS | Status: AC
  Administered 2018-02-18: 2 mg via INTRAVENOUS
  Filled 2018-02-18: qty 1

## 2018-02-18 MED ORDER — METOCLOPRAMIDE HCL 5 MG/ML IJ SOLN
10.0000 mg | Freq: Four times a day (QID) | INTRAMUSCULAR | Status: DC
  Administered 2018-02-18 – 2018-02-20 (×8): 10 mg via INTRAVENOUS
  Filled 2018-02-18 (×8): qty 2

## 2018-02-18 MED ORDER — PANTOPRAZOLE SODIUM 40 MG IV SOLR
40.0000 mg | INTRAVENOUS | Status: DC
  Administered 2018-02-18: 40 mg via INTRAVENOUS
  Filled 2018-02-18: qty 40

## 2018-02-18 MED ORDER — POTASSIUM CHLORIDE CRYS ER 20 MEQ PO TBCR: 20.0000 meq | EXTENDED_RELEASE_TABLET | Freq: Once | ORAL | Status: DC

## 2018-02-18 MED ORDER — TRAMADOL HCL 50 MG PO TABS: 50.0000 mg | ORAL_TABLET | Freq: Four times a day (QID) | ORAL | Status: DC | PRN

## 2018-02-18 MED ORDER — INSULIN GLARGINE 100 UNIT/ML ~~LOC~~ SOLN
8.0000 [IU] | SUBCUTANEOUS | Status: DC
  Administered 2018-02-18: 8 [IU] via SUBCUTANEOUS
  Filled 2018-02-18: qty 0.08

## 2018-02-18 MED ORDER — HYDROCODONE-ACETAMINOPHEN 5-325 MG PO TABS: 1.0000 | ORAL_TABLET | ORAL | Status: DC | PRN

## 2018-02-18 MED ORDER — LORAZEPAM 2 MG/ML IJ SOLN
0.5000 mg | Freq: Four times a day (QID) | INTRAMUSCULAR | Status: DC | PRN
  Administered 2018-02-19 – 2018-02-20 (×4): 0.5 mg via INTRAVENOUS
  Filled 2018-02-18 (×4): qty 1

## 2018-02-18 MED ORDER — DEXTROSE-NACL 5-0.45 % IV SOLN
INTRAVENOUS | Status: DC
  Administered 2018-02-18: 03:00:00 via INTRAVENOUS

## 2018-02-18 MED ORDER — MORPHINE SULFATE (PF) 2 MG/ML IV SOLN
2.0000 mg | INTRAVENOUS | Status: DC | PRN
  Administered 2018-02-18 – 2018-02-20 (×10): 2 mg via INTRAVENOUS
  Filled 2018-02-18 (×12): qty 1

## 2018-02-18 MED ORDER — LABETALOL HCL 5 MG/ML IV SOLN
5.0000 mg | INTRAVENOUS | Status: DC | PRN
  Administered 2018-02-18: 5 mg via INTRAVENOUS
  Filled 2018-02-18 (×3): qty 4

## 2018-02-18 NOTE — Care Management Note (Signed)
Case Management Note  Patient Details  Name: [name] MRN: [id number] Date of Birth: Jun 01, 1990  Subjective/Objective:DKA. From home. Has pharmacy-able to pay co pay, no pcp. Provided w/pcp listing for 5 mile radius with [facility](patient can make own pcp appt).CM referral for meds-patient has insurance-obligated with co pay.provided w/goodrx website-discount coupons on meds.  No further CM needs.                   Action/Plan:d/c home.   Expected Discharge Date:  (unknown)               Expected Discharge Plan:  Home/Self Care  In-House Referral:     Discharge planning Services  CM Consult  Post Acute Care Choice:    Choice offered to:     DME Arranged:    DME Agency:     HH Arranged:    HH Agency:     Status of Service:  In process, will continue to follow  If discussed at Long Length of Stay Meetings, dates discussed:    Additional Comments:  Lanier Clam, RN 02/18/2018, 3:24 PM

## 2018-02-18 NOTE — ED Notes (Signed)
Pt c/o nausea after ice chips phenergan requested

## 2018-02-18 NOTE — Progress Notes (Signed)
PROGRESS NOTE    IVANNAH ZODY  CMK:[id number] DOB: 01-11-1991 DOA: 02/17/2018 PCP: Patient, No Pcp Per   Brief Narrative: Patient is a 27 year old female with past medical history of diabetes type 1 on insulin, diabetic gastroparesis, hypertension, chronic diarrhea who presented to the emergency room with complaints of nausea, vomiting and abdominal pain for last 2 to 3 days.  She was found to be in DKA.  Reported that she was out on her insulin for last week as it was not delivered.  Assessment & Plan:   Active Problems:   Chronic abdominal pain   Hypertension associated with diabetes ([place])   Gastroparesis   Diabetes mellitus type 1 ([facility])   Protein-calorie malnutrition, severe ([facility])   DKA, type 1 ([facility])   Nausea and vomiting   Essential hypertension   Intractable nausea and vomiting   GERD (gastroesophageal reflux disease)   Normocytic anemia   DKA (diabetic ketoacidoses) ([facility])   Diabetic ketoacidosis: Reported to be not on Lantus for last 1 week.  Started on DKA protocol.  Insulin drip has  already been stopped at the gap is closed.  Start on clear liquid diet.  Continue IV fluids. Diabetic coordinator consulted. Will check new hemoglobin A1c level.  She follows with endocrinology as an outpatient.  She says she is compliant with insulin.  Diabetic gastroparesis: Most likely this is the etiology for nausea and vomiting.  As per the records: Reglan has not helped her in the past and she was seen by GI.  She has been set up follow-up at [facility] which she has not made yet. Continue Reglan.  Continue pain medication.  She should be on  small volume, low-fat diet frequently when she improves.  Continue clear liquid diet for now.   Abdominal pain: This is most likely secondary from her diabetic gastroparesis.  She also has history of chronic abdominal pain.  Hypertension: Continue current regimen.  Protein calorie malnutrition: Patient consulted  GERD: Continue Protonix      DVT prophylaxis: SCD Code Status: Full Family Communication: None present at the bedside Disposition Plan: Home after improvement in the nausea, vomiting abdominal pain   Consultants: None   Procedures: None  Antimicrobials: None  Subjective: Patient seen and examined the bedside in the emergency department.  She was still having severe nausea vomiting abdominal pain.  Objective: Vitals:   02/18/18 0915 02/18/18 0930 02/18/18 1030 02/18/18 1130  BP:  (!) 171/107 (!) 182/111 (!) 151/93  Pulse: (!) 122 (!) 112 (!) 115 (!) 105  Resp: (!) 26 18 11 16   Temp:      TempSrc:      SpO2: 100% 98% 100% 98%    Intake/Output Summary (Last 24 hours) at 02/18/2018 1219 Last data filed at 02/18/2018 5697 Gross per 24 hour  Intake 2256.25 ml  Output -  Net 2256.25 ml   There were no vitals filed for this visit.  Examination:  General exam: In moderate distress, thin built HEENT:PERRL,Oral mucosa moist, Ear/Nose normal on gross exam Respiratory system: Bilateral equal air entry, normal vesicular breath sounds, no wheezes or crackles  Cardiovascular system: S1 & S2 heard, RRR. No JVD, murmurs, rubs, gallops or clicks. No pedal edema. Gastrointestinal system: Abdomen is nondistended, soft and nontender. No organomegaly or masses felt. Normal bowel sounds heard. Central nervous system: Alert and oriented. No focal neurological deficits. Extremities: No edema, no clubbing ,no cyanosis, distal peripheral pulses palpable. Skin: No rashes, lesions or ulcers,no icterus ,no pallor  Data Reviewed: I have personally reviewed following labs and imaging studies  CBC: Recent Labs  Lab 02/17/18 1928 02/18/18 0557  WBC 7.5 6.4  HGB 11.4* 12.3  HCT 33.7* 36.7  MCV 82.2 84.4  PLT PLATELET CLUMPS NOTED ON SMEAR, UNABLE TO ESTIMATE 254   Basic Metabolic Panel: Recent Labs  Lab 02/17/18 1928 02/18/18 0232 02/18/18 0557  NA 135 141 140  K 4.0 3.4* 3.8  CL 94* 105 102  CO2 18*  23 25  GLUCOSE 508* 274* 225*  BUN 26* 25* 26*  CREATININE 0.91 0.67 0.47  CALCIUM 9.8 8.8* 9.3  MG  --   --  2.0  PHOS  --   --  2.4*   GFR: CrCl cannot be calculated (Unknown ideal weight.). Liver Function Tests: Recent Labs  Lab 02/18/18 0232 02/18/18 0557  AST 19 21  ALT 15 16  ALKPHOS 90 98  BILITOT 0.8 0.7  PROT 8.0 9.4*  ALBUMIN 3.7 4.4   No results for input(s): LIPASE, AMYLASE in the last 168 hours. No results for input(s): AMMONIA in the last 168 hours. Coagulation Profile: No results for input(s): INR, PROTIME in the last 168 hours. Cardiac Enzymes: Recent Labs  Lab 02/18/18 0232 02/18/18 0557  CKTOTAL 42 42   BNP (last 3 results) No results for input(s): PROBNP in the last 8760 hours. HbA1C: No results for input(s): HGBA1C in the last 72 hours. CBG: Recent Labs  Lab 02/18/18 0228 02/18/18 0345 02/18/18 0803 02/18/18 0952 02/18/18 1138  GLUCAP 225* 174* 189* 198* 191*   Lipid Profile: No results for input(s): CHOL, HDL, LDLCALC, TRIG, CHOLHDL, LDLDIRECT in the last 72 hours. Thyroid Function Tests: Recent Labs    02/18/18 0557  TSH 0.596   Anemia Panel: No results for input(s): VITAMINB12, FOLATE, FERRITIN, TIBC, IRON, RETICCTPCT in the last 72 hours. Sepsis Labs: No results for input(s): PROCALCITON, LATICACIDVEN in the last 168 hours.  No results found for this or any previous visit (from the past 240 hour(s)).       Radiology Studies: No results found.      Scheduled Meds: . insulin aspart  0-5 Units Subcutaneous QHS  . insulin aspart  0-9 Units Subcutaneous TID WC  . [START ON 02/19/2018] insulin glargine  15 Units Subcutaneous Q24H  . metoCLOPramide (REGLAN) injection  10 mg Intravenous Q6H  . mirtazapine  15 mg Oral QHS  . pantoprazole  40 mg Oral BID  . potassium chloride  20 mEq Oral Once  . sucralfate  1 g Oral TID WC & HS   Continuous Infusions: . sodium chloride 100 mL/hr at 02/18/18 0852     LOS: 0 days     Time spent: 35 mins.More than 50% of that time was spent in counseling and/or coordination of care.      Shelly Coss, MD [facility] Pager [phone]  If 7PM-7AM, please contact night-coverage [email or web address] Password [facility] 02/18/2018, 12:19 PM

## 2018-02-18 NOTE — ED Notes (Signed)
ED TO INPATIENT HANDOFF REPORT  Name/Age/Gender [name] 27 y.o. female  Code Status    Code Status Orders  (From admission, onward)         Start     Ordered   02/18/18 0231  Full code  Continuous     02/18/18 0230        Code Status History    Date Active Date Inactive Code Status Order ID Comments User Context   02/18/2018 0035 02/18/2018 0230 Full Code [id number]  Toy Baker, MD ED   12/19/2017 0305 12/25/2017 1830 Full Code [id number]  Etta Quill, DO ED   07/27/2017 0340 08/01/2017 1716 Full Code [id number]  Rise Patience, MD ED   07/10/2017 0441 07/13/2017 1908 Full Code [id number]  Vianne Bulls, MD ED   06/23/2017 2017 06/27/2017 2144 Full Code [id number]  Norval Morton, MD ED   04/10/2017 1406 04/16/2017 1816 Full Code [id number]  Phillips Grout, MD Inpatient   03/23/2017 1316 03/24/2017 1854 Full Code [id number]  Phillips Grout, MD Inpatient   03/17/2017 0050 03/21/2017 2352 Full Code [id number]  Rise Patience, MD ED   02/26/2017 2259 03/14/2017 2221 Full Code [id number]  Karmen Bongo, MD Inpatient   02/26/2017 2012 02/26/2017 2259 Full Code [id number]  Aldean Jewett, MD ED   01/26/2015 0513 01/29/2015 1812 Full Code [id number]  Morene Crocker, CNM Inpatient   01/24/2015 1021 01/26/2015 0513 Full Code [id number]  Mcneil Sober, RN Inpatient   01/16/2015 1930 01/24/2015 1021 Full Code [id number]  Gwen Pounds, [place] Inpatient   06/11/2013 0049 06/12/2013 1340 Full Code [id number]  Theressa Millard, MD ED   04/28/2013 0319 04/29/2013 1814 Full Code [id number]  Rise Patience, MD Inpatient   09/01/2012 0749 09/02/2012 1159 Full Code [id number]  Derrill Kay, MD Inpatient   03/15/2012 2346 03/16/2012 1646 Full Code [id number]  Rueben Bash, RN Inpatient   02/28/2012 1657 03/01/2012 1921 Full Code [id number]  Tamsen Roers, RN Inpatient   11/23/2011 0336 12/06/2011 2004 Full Code [id number]  Aldean Ast, RN Inpatient   09/18/2011 1007  09/20/2011 2050 Full Code [id number]  Dionne Milo, NP Inpatient      Home/SNF/Other Home  Chief Complaint Abdominal Pain; Diarrhea; Nausea  Level of Care/Admitting Diagnosis ED Disposition    ED Disposition Condition [facility] Area: [place] [[id number]]  Level of Care: Telemetry [5]  Admit to tele based on following criteria: Monitor QTC interval  Diagnosis: DKA (diabetic ketoacidoses) ([place]) [459977]  Admitting Physician: Toy Baker [[id number]]  Attending Physician: Toy Baker [[id number]]  PT Class (Do Not Modify): Observation [104]  PT Acc Code (Do Not Modify): Observation [10022]       Medical History Past Medical History:  Diagnosis Date  . Allergy   . Anemia   . Anxiety   . Blood transfusion without reported diagnosis    Dec 2018  . Cataract    right eye  . Depression   . Diabetes type 1, uncontrolled ([place]) 11/14/2011   Since age 89  . Fibromyalgia   . Gastroparesis   . GERD (gastroesophageal reflux disease)   . Hypertension   . Infection    UTI April 2016    Allergies Allergies  Allergen Reactions  . Other Anaphylaxis    Reaction to [place] nuts   . Lactose Intolerance (Gi) Diarrhea    IV Location/Drains/Wounds Patient Lines/Drains/Airways Status   Active Line/Drains/Airways  Name:   Placement date:   Placement time:   Site:   Days:   Peripheral IV 02/17/18 Right;Medial Forearm   02/17/18    2210    Forearm   1   Peripheral IV 02/18/18 Left Hand   02/18/18    0210    Hand   less than 1          Labs/Imaging Results for orders placed or performed during the hospital encounter of 02/17/18 (from the past 48 hour(s))  CBG monitoring, ED     Status: Abnormal   Collection Time: 02/17/18  7:21 PM  Result Value Ref Range   Glucose-Capillary 456 (H) 70 - 99 mg/dL  Basic metabolic panel     Status: Abnormal   Collection Time: 02/17/18  7:28 PM  Result Value Ref Range   Sodium 135 135 - 145 mmol/L    Potassium 4.0 3.5 - 5.1 mmol/L   Chloride 94 (L) 98 - 111 mmol/L   CO2 18 (L) 22 - 32 mmol/L   Glucose, Bld 508 (HH) 70 - 99 mg/dL    Comment: CRITICAL RESULT CALLED TO, READ BACK BY AND VERIFIED WITH: Tyson Babinski RN [id number] 02/17/18 A NAVARRO    BUN 26 (H) 6 - 20 mg/dL   Creatinine, Ser 0.91 0.44 - 1.00 mg/dL   Calcium 9.8 8.9 - 10.3 mg/dL   GFR calc non Af Amer >60 >60 mL/min   GFR calc Af Amer >60 >60 mL/min    Comment: (NOTE) The eGFR has been calculated using the CKD EPI equation. This calculation has not been validated in all clinical situations. eGFR's persistently <60 mL/min signify possible Chronic Kidney Disease.    Anion gap 23 (H) 5 - 15    Comment: Performed at [facility], [place] [address], [place], [place] [address]  CBC     Status: Abnormal   Collection Time: 02/17/18  7:28 PM  Result Value Ref Range   WBC 7.5 4.0 - 10.5 K/uL   RBC 4.10 3.87 - 5.11 MIL/uL   Hemoglobin 11.4 (L) 12.0 - 15.0 g/dL   HCT 33.7 (L) 36.0 - 46.0 %   MCV 82.2 80.0 - 100.0 fL   MCH 27.8 26.0 - 34.0 pg   MCHC 33.8 30.0 - 36.0 g/dL   RDW 11.6 11.5 - 15.5 %   Platelets PLATELET CLUMPS NOTED ON SMEAR, UNABLE TO ESTIMATE 150 - 400 K/uL    Comment: REPEATED TO VERIFY SPECIMEN CHECKED FOR CLOTS Immature Platelet Fraction may be clinically indicated, consider ordering this additional test [id number]    nRBC 0.0 0.0 - 0.2 %    Comment: Performed at [facility], [place] [address] [place], [place] [address]  CBG monitoring, ED     Status: Abnormal   Collection Time: 02/17/18  9:57 PM  Result Value Ref Range   Glucose-Capillary 464 (H) 70 - 99 mg/dL  CBG monitoring, ED     Status: Abnormal   Collection Time: 02/17/18 11:21 PM  Result Value Ref Range   Glucose-Capillary 529 (HH) 70 - 99 mg/dL   Comment 1 Notify RN    Comment 2 Document in Chart   CBG monitoring, ED     Status: Abnormal   Collection Time: 02/18/18 12:24 AM  Result Value Ref Range    Glucose-Capillary 371 (H) 70 - 99 mg/dL  CBG monitoring, ED     Status: Abnormal   Collection Time: 02/18/18  1:18 AM  Result Value Ref Range  Glucose-Capillary 285 (H) 70 - 99 mg/dL  CBG monitoring, ED     Status: Abnormal   Collection Time: 02/18/18  2:28 AM  Result Value Ref Range   Glucose-Capillary 225 (H) 70 - 99 mg/dL  Beta-hydroxybutyric acid     Status: Abnormal   Collection Time: 02/18/18  2:32 AM  Result Value Ref Range   Beta-Hydroxybutyric Acid 2.17 (H) 0.05 - 0.27 mmol/L    Comment: Performed at [facility], [place] [address], [place], [place] [address]  CK     Status: None   Collection Time: 02/18/18  2:32 AM  Result Value Ref Range   Total CK 42 38 - 234 U/L    Comment: Performed at [facility], [place] [address] [place], [place] [address]  Basic metabolic panel     Status: Abnormal   Collection Time: 02/18/18  2:32 AM  Result Value Ref Range   Sodium 141 135 - 145 mmol/L   Potassium 3.4 (L) 3.5 - 5.1 mmol/L   Chloride 105 98 - 111 mmol/L   CO2 23 22 - 32 mmol/L   Glucose, Bld 274 (H) 70 - 99 mg/dL   BUN 25 (H) 6 - 20 mg/dL   Creatinine, Ser 0.67 0.44 - 1.00 mg/dL   Calcium 8.8 (L) 8.9 - 10.3 mg/dL   GFR calc non Af Amer >60 >60 mL/min   GFR calc Af Amer >60 >60 mL/min    Comment: (NOTE) The eGFR has been calculated using the CKD EPI equation. This calculation has not been validated in all clinical situations. eGFR's persistently <60 mL/min signify possible Chronic Kidney Disease.    Anion gap 13 5 - 15    Comment: Performed at [facility], [place] [address] [place], [place] [address]  Hepatic function panel     Status: None   Collection Time: 02/18/18  2:32 AM  Result Value Ref Range   Total Protein 8.0 6.5 - 8.1 g/dL   Albumin 3.7 3.5 - 5.0 g/dL   AST 19 15 - 41 U/L   ALT 15 0 - 44 U/L   Alkaline Phosphatase 90 38 - 126 U/L   Total Bilirubin 0.8 0.3 - 1.2 mg/dL   Bilirubin, Direct 0.1 0.0 - 0.2  mg/dL   Indirect Bilirubin 0.7 0.3 - 0.9 mg/dL    Comment: Performed at [facility], [place] [address], [place], [place] [address]  CBG monitoring, ED     Status: Abnormal   Collection Time: 02/18/18  3:45 AM  Result Value Ref Range   Glucose-Capillary 174 (H) 70 - 99 mg/dL  CK     Status: None   Collection Time: 02/18/18  5:57 AM  Result Value Ref Range   Total CK 42 38 - 234 U/L    Comment: Performed at [facility], [place] [address], [place], [place] [address]  Magnesium     Status: None   Collection Time: 02/18/18  5:57 AM  Result Value Ref Range   Magnesium 2.0 1.7 - 2.4 mg/dL    Comment: Performed at [facility], [place] [address], [place], [place] [address]  Phosphorus     Status: Abnormal   Collection Time: 02/18/18  5:57 AM  Result Value Ref Range   Phosphorus 2.4 (L) 2.5 - 4.6 mg/dL    Comment: Performed at [facility], [place] [address] [place], [place] [address]  TSH     Status: None   Collection  Time: 02/18/18  5:57 AM  Result Value Ref Range   TSH 0.596 0.350 - 4.500 uIU/mL    Comment: Performed by a 3rd Generation assay with a functional sensitivity of <=0.01 uIU/mL. Performed at [facility], [place] [address] [place], [place] [address]   Comprehensive metabolic panel     Status: Abnormal   Collection Time: 02/18/18  5:57 AM  Result Value Ref Range   Sodium 140 135 - 145 mmol/L   Potassium 3.8 3.5 - 5.1 mmol/L   Chloride 102 98 - 111 mmol/L   CO2 25 22 - 32 mmol/L   Glucose, Bld 225 (H) 70 - 99 mg/dL   BUN 26 (H) 6 - 20 mg/dL   Creatinine, Ser 0.47 0.44 - 1.00 mg/dL   Calcium 9.3 8.9 - 10.3 mg/dL   Total Protein 9.4 (H) 6.5 - 8.1 g/dL   Albumin 4.4 3.5 - 5.0 g/dL   AST 21 15 - 41 U/L   ALT 16 0 - 44 U/L   Alkaline Phosphatase 98 38 - 126 U/L   Total Bilirubin 0.7 0.3 - 1.2 mg/dL   GFR calc non Af Amer >60 >60 mL/min   GFR calc Af Amer >60 >60 mL/min    Comment:  (NOTE) The eGFR has been calculated using the CKD EPI equation. This calculation has not been validated in all clinical situations. eGFR's persistently <60 mL/min signify possible Chronic Kidney Disease.    Anion gap 13 5 - 15    Comment: Performed at [facility], [place] [address] [place], [place] [address]  CBC     Status: None   Collection Time: 02/18/18  5:57 AM  Result Value Ref Range   WBC 6.4 4.0 - 10.5 K/uL   RBC 4.35 3.87 - 5.11 MIL/uL   Hemoglobin 12.3 12.0 - 15.0 g/dL   HCT 36.7 36.0 - 46.0 %   MCV 84.4 80.0 - 100.0 fL   MCH 28.3 26.0 - 34.0 pg   MCHC 33.5 30.0 - 36.0 g/dL   RDW 11.7 11.5 - 15.5 %   Platelets 243 150 - 400 K/uL   nRBC 0.0 0.0 - 0.2 %    Comment: Performed at [facility], [place] [address] [place], [place] [address]  Prealbumin     Status: None   Collection Time: 02/18/18  5:57 AM  Result Value Ref Range   Prealbumin 21.0 18 - 38 mg/dL    Comment: Performed at [facility] [place] [address] [place], [place] [address]  CBG monitoring, ED     Status: Abnormal   Collection Time: 02/18/18  8:03 AM  Result Value Ref Range   Glucose-Capillary 189 (H) 70 - 99 mg/dL  CBG monitoring, ED     Status: Abnormal   Collection Time: 02/18/18  9:52 AM  Result Value Ref Range   Glucose-Capillary 198 (H) 70 - 99 mg/dL  CBG monitoring, ED     Status: Abnormal   Collection Time: 02/18/18 11:38 AM  Result Value Ref Range   Glucose-Capillary 191 (H) 70 - 99 mg/dL   No results found. EKG Interpretation  Date/Time:  Wednesday February 17 2018 23:35:31 EST Ventricular Rate:  122 PR Interval:    QRS Duration: 86 QT Interval:  332 QTC Calculation: 473 R Axis:   70 Text Interpretation:  Sinus tachycardia Multiple ventricular premature complexes Borderline T abnormalities, inferior leads Baseline wander in lead(s) I II III V1 Confirmed by Davonna Belling [phone]) on 02/17/2018  11:38:41 PM Also confirmed by  Davonna Belling [phone]), editor Philomena Doheny [phone])  on 02/18/2018 7:09:49 AM   Pending Labs Unresulted Labs (From admission, onward)    Start     Ordered   02/19/18 2353  Basic metabolic panel  Tomorrow morning,   R     02/18/18 0943   02/18/18 6144  Basic metabolic panel  Once,   R     02/18/18 0943   02/17/18 1928  Urinalysis, Routine w reflex microscopic  Once,   STAT     02/17/18 1927          Vitals/Pain Today's Vitals   02/18/18 0915 02/18/18 0930 02/18/18 1030 02/18/18 1130  BP:  (!) 171/107 (!) 182/111 (!) 151/93  Pulse: (!) 122 (!) 112 (!) 115 (!) 105  Resp: (!) '26 18 11 16  '$ Temp:      TempSrc:      SpO2: 100% 98% 100% 98%  PainSc:        Isolation Precautions No active isolations  Medications Medications  mirtazapine (REMERON) tablet 15 mg (has no administration in time range)  pantoprazole (PROTONIX) EC tablet 40 mg (40 mg Oral Not Given 02/18/18 0242)  sucralfate (CARAFATE) tablet 1 g (0 g Oral Hold 02/18/18 0800)  acetaminophen (TYLENOL) tablet 650 mg (has no administration in time range)    Or  acetaminophen (TYLENOL) suppository 650 mg (has no administration in time range)  HYDROcodone-acetaminophen (NORCO/VICODIN) 5-325 MG per tablet 1-2 tablet (has no administration in time range)  ondansetron (ZOFRAN) tablet 4 mg ( Oral See Alternative 02/18/18 0852)    Or  ondansetron (ZOFRAN) injection 4 mg (4 mg Intravenous Given 02/18/18 0852)  ketorolac (TORADOL) 15 MG/ML injection 15 mg (15 mg Intravenous Given 02/18/18 0103)  traMADol (ULTRAM) tablet 50 mg (has no administration in time range)  LORazepam (ATIVAN) injection 0.5 mg (has no administration in time range)  insulin aspart (novoLOG) injection 0-9 Units (2 Units Subcutaneous Given 02/18/18 0817)  insulin aspart (novoLOG) injection 0-5 Units (has no administration in time range)  potassium chloride SA (K-DUR,KLOR-CON) CR tablet 20 mEq (0 mEq Oral Hold 02/18/18 0433)  promethazine (PHENERGAN)  injection 12.5-25 mg (25 mg Intravenous Given 02/18/18 0650)  0.9 %  sodium chloride infusion ( Intravenous New Bag/Given 02/18/18 0852)  insulin glargine (LANTUS) injection 15 Units (has no administration in time range)  metoCLOPramide (REGLAN) injection 10 mg (10 mg Intravenous Given 02/18/18 1151)  morphine 2 MG/ML injection 2 mg (2 mg Intravenous Given 02/18/18 1149)  metoCLOPramide (REGLAN) injection 10 mg (10 mg Intravenous Given 02/17/18 2208)  sodium chloride 0.9 % bolus 1,000 mL (0 mLs Intravenous Stopped 02/18/18 0030)  LORazepam (ATIVAN) injection 1 mg (1 mg Intravenous Given 02/17/18 2322)  potassium chloride 10 mEq in 100 mL IVPB (0 mEq Intravenous Stopped 02/18/18 0331)  morphine 2 MG/ML injection 2 mg (2 mg Intravenous Given 02/18/18 0432)    Mobility walks

## 2018-02-18 NOTE — Progress Notes (Signed)
Inpatient Diabetes Program Recommendations  AACE/ADA: New Consensus Statement on Inpatient Glycemic Control (2015)  Target Ranges:  Prepandial:   less than 140 mg/dL      Peak postprandial:   less than 180 mg/dL (1-2 hours)      Critically ill patients:  140 - 180 mg/dL   Lab Results  Component Value Date   GLUCAP 191 (H) 02/18/2018   HGBA1C 5.7 (H) 04/10/2017    Review of Glycemic Control  Diabetes history: DM1 Outpatient Diabetes medications: Lantus 12 units QHS, Humalog 0-9 units tidwc Current orders for Inpatient glycemic control: Lantus 15 units Q24H, Novolog 0-9 units tidwc and hs  HgbA1C is pending. Admitted for DKA.  Pt ran out of insulin. Gets insulin from mail-order.  Transitioned off drip to Lantus 8 units.  Inpatient Diabetes Program Recommendations:     Reduce Lantus dose to 12 units Q24H (first dose 11/22 at 0600) When pt starts eating and po intake increases, add 2 units tidwc for meal coverage insulin if pt eats > 50% meal.  Will see pt this afternoon.   Thank you. Ailene Ards, RD, LDN, CDE Inpatient Diabetes Coordinator [phone]

## 2018-02-18 NOTE — Progress Notes (Signed)
Inpatient Diabetes Program Recommendations  AACE/ADA: New Consensus Statement on Inpatient Glycemic Control (2015)  Target Ranges:  Prepandial:   less than 140 mg/dL      Peak postprandial:   less than 180 mg/dL (1-2 hours)      Critically ill patients:  140 - 180 mg/dL   Lab Results  Component Value Date   GLUCAP 202 (H) 02/18/2018   HGBA1C 5.7 (H) 04/10/2017    Review of Glycemic Control  Briefly spoke with pt regarding insulin running out. Pt states she uses [employer] and was late in ordering, therefore she ran out. Discussed purchasing [facility] insulin or calling MD office and inquire if samples are available until insulin comes. Pt was reminded to mark on calendar day to order so she will not run out. Pt voiced understanding.  Continues to be nauseated. Only po intake today is popsicles. Will continue to follow.   Thank you. Ailene Ards, RD, LDN, CDE Inpatient Diabetes Coordinator [phone]

## 2018-02-19 DIAGNOSIS — F329 Major depressive disorder, single episode, unspecified: Secondary | ICD-10-CM | POA: Diagnosis present

## 2018-02-19 DIAGNOSIS — Z91018 Allergy to other foods: Secondary | ICD-10-CM | POA: Diagnosis not present

## 2018-02-19 DIAGNOSIS — R112 Nausea with vomiting, unspecified: Secondary | ICD-10-CM | POA: Diagnosis not present

## 2018-02-19 DIAGNOSIS — Z833 Family history of diabetes mellitus: Secondary | ICD-10-CM | POA: Diagnosis not present

## 2018-02-19 DIAGNOSIS — D649 Anemia, unspecified: Secondary | ICD-10-CM | POA: Diagnosis present

## 2018-02-19 DIAGNOSIS — E1069 Type 1 diabetes mellitus with other specified complication: Secondary | ICD-10-CM | POA: Diagnosis present

## 2018-02-19 DIAGNOSIS — K3184 Gastroparesis: Secondary | ICD-10-CM

## 2018-02-19 DIAGNOSIS — E1010 Type 1 diabetes mellitus with ketoacidosis without coma: Secondary | ICD-10-CM | POA: Diagnosis not present

## 2018-02-19 DIAGNOSIS — K219 Gastro-esophageal reflux disease without esophagitis: Secondary | ICD-10-CM | POA: Diagnosis present

## 2018-02-19 DIAGNOSIS — I152 Hypertension secondary to endocrine disorders: Secondary | ICD-10-CM | POA: Diagnosis present

## 2018-02-19 DIAGNOSIS — E1043 Type 1 diabetes mellitus with diabetic autonomic (poly)neuropathy: Secondary | ICD-10-CM | POA: Diagnosis present

## 2018-02-19 DIAGNOSIS — E43 Unspecified severe protein-calorie malnutrition: Secondary | ICD-10-CM | POA: Diagnosis not present

## 2018-02-19 DIAGNOSIS — E739 Lactose intolerance, unspecified: Secondary | ICD-10-CM | POA: Diagnosis present

## 2018-02-19 DIAGNOSIS — G8929 Other chronic pain: Secondary | ICD-10-CM | POA: Diagnosis present

## 2018-02-19 DIAGNOSIS — Z79899 Other long term (current) drug therapy: Secondary | ICD-10-CM | POA: Diagnosis not present

## 2018-02-19 DIAGNOSIS — M797 Fibromyalgia: Secondary | ICD-10-CM | POA: Diagnosis present

## 2018-02-19 DIAGNOSIS — Z681 Body mass index (BMI) 19 or less, adult: Secondary | ICD-10-CM | POA: Diagnosis not present

## 2018-02-19 LAB — BASIC METABOLIC PANEL
Anion gap: 9 (ref 5–15)
BUN: 14 mg/dL (ref 6–20)
CO2: 26 mmol/L (ref 22–32)
Calcium: 8.5 mg/dL — ABNORMAL LOW (ref 8.9–10.3)
Chloride: 102 mmol/L (ref 98–111)
Creatinine, Ser: 0.5 mg/dL (ref 0.44–1.00)
GFR calc Af Amer: >60 mL/min (ref >60)
GFR calc non Af Amer: >60 mL/min (ref >60)
Glucose, Bld: 260 mg/dL — ABNORMAL HIGH (ref 70–99)
Potassium: 3.7 mmol/L (ref 3.5–5.1)
Sodium: 137 mmol/L (ref 135–145)

## 2018-02-19 LAB — GLUCOSE, CAPILLARY
Glucose-Capillary: 237 mg/dL — ABNORMAL HIGH (ref 70–99)
Glucose-Capillary: 251 mg/dL — ABNORMAL HIGH (ref 70–99)
Glucose-Capillary: 104 mg/dL — ABNORMAL HIGH (ref 70–99)
Glucose-Capillary: 76 mg/dL (ref 70–99)
Glucose-Capillary: 126 mg/dL — ABNORMAL HIGH (ref 70–99)

## 2018-02-19 LAB — HEMOGLOBIN A1C
Hgb A1c MFr Bld: 12 % — ABNORMAL HIGH (ref 4.8–5.6)
Mean Plasma Glucose: 297.7 mg/dL

## 2018-02-19 MED ORDER — PANTOPRAZOLE SODIUM 40 MG IV SOLR
40.0000 mg | Freq: Two times a day (BID) | INTRAVENOUS | Status: DC
  Administered 2018-02-19 – 2018-02-20 (×3): 40 mg via INTRAVENOUS
  Filled 2018-02-19 (×3): qty 40

## 2018-02-19 MED ORDER — ONDANSETRON HCL 4 MG PO TABS
4.0000 mg | ORAL_TABLET | Freq: Four times a day (QID) | ORAL | Status: DC
  Administered 2018-02-20: 4 mg via ORAL
  Filled 2018-02-19 (×2): qty 1

## 2018-02-19 MED ORDER — ONDANSETRON HCL 4 MG/2ML IJ SOLN
4.0000 mg | Freq: Four times a day (QID) | INTRAMUSCULAR | Status: DC
  Administered 2018-02-19 – 2018-02-20 (×4): 4 mg via INTRAVENOUS
  Filled 2018-02-19 (×4): qty 2

## 2018-02-19 MED ORDER — HYOSCYAMINE SULFATE 0.125 MG SL SUBL
0.1250 mg | SUBLINGUAL_TABLET | Freq: Four times a day (QID) | SUBLINGUAL | Status: DC
  Administered 2018-02-19 – 2018-02-20 (×5): 0.125 mg via SUBLINGUAL
  Filled 2018-02-19 (×6): qty 1

## 2018-02-19 MED ORDER — BOOST / RESOURCE BREEZE PO LIQD CUSTOM
1.0000 | Freq: Two times a day (BID) | ORAL | Status: DC
  Administered 2018-02-20: 1 via ORAL

## 2018-02-19 MED ORDER — ADULT MULTIVITAMIN W/MINERALS CH: 1.0000 | ORAL_TABLET | Freq: Every day | ORAL | Status: DC

## 2018-02-19 NOTE — Progress Notes (Signed)
PROGRESS NOTE    [name]  ZLD:[id number] DOB: 08/21/1990 DOA: 02/17/2018 PCP: Patient, No Pcp Per   Brief Narrative: Patient is a 27 year old female with past medical history of diabetes type 1 on insulin, diabetic gastroparesis, hypertension, chronic diarrhea who presented to the emergency room with complaints of nausea, vomiting and abdominal pain for last 2 to 3 days.  She was found to be in DKA.  Reported that she was out on her insulin for last week as it was not delivered.  Assessment & Plan:   Active Problems:   Chronic abdominal pain   Hypertension associated with diabetes ([place])   Gastroparesis   Diabetes mellitus type 1 (HCC)   Protein-calorie malnutrition, severe (HCC)   DKA, type 1 (HCC)   Nausea and vomiting   Essential hypertension   Intractable nausea and vomiting   GERD (gastroesophageal reflux disease)   Normocytic anemia   DKA (diabetic ketoacidoses) (HCC)   Diabetic ketoacidosis: Reported to be not on Lantus for last 1 week.  Started on DKA protocol.  Insulin drip has  already been stopped at the gap is closed.  Start on clear liquid diet.  Continue IV fluids. Diabetic coordinator consulted. Hemoglobin A1c level was 12.  She follows with endocrinology as an outpatient.  She says she is compliant with insulin. Continue long-acting insulin and sliding scale insulin.  Diabetic gastroparesis: She seems to have nausea, vomiting and abdominal pain. As per the records: Reglan has not helped her in the past and she was seen by GI.  She has been set up follow-up at [facility] for possible gastric pacing which she has not made yet. Continue Reglan.  Continue pain medication.  She should be on  small volume, low-fat diet frequently when she improves.  Continue clear liquid diet for now. GI consulted today.   Abdominal pain: This is most likely secondary from her diabetic gastroparesis.  She also has history of chronic abdominal pain.Continue pain meds.  Hypertension:  Continue current regimen.  Protein calorie malnutrition: Nutrition consulted  GERD: Continue Protonix     DVT prophylaxis: SCD Code Status: Full Family Communication: None present at the bedside Disposition Plan: Home after improvement in the nausea, vomiting abdominal pain   Consultants: None   Procedures: None  Antimicrobials: None  Subjective: Patient seen and examined the bedside.  Continues to have nausea, vomiting abdominal pain.   Objective: Vitals:   02/18/18 1603 02/18/18 2126 02/18/18 2207 02/19/18 0452  BP: (!) 162/97 (!) 183/115 (!) 141/93 (!) 163/102  Pulse: (!) 112 (!) 119 (!) 109 (!) 109  Resp:  14  16  Temp:  99.2 F (37.3 C)  98.4 F (36.9 C)  TempSrc:  Oral  Oral  SpO2: 100% 98%  98%  Weight:      Height:        Intake/Output Summary (Last 24 hours) at 02/19/2018 1043 Last data filed at 02/19/2018 0400 Gross per 24 hour  Intake 3333.34 ml  Output 254 ml  Net 3079.34 ml   Filed Weights   02/18/18 1553  Weight: 57.6 kg    Examination:  General exam: In moderate distress due to nausea, abdominal pain, thin built HEENT:PERRL,Oral mucosa moist, Ear/Nose normal on gross exam Respiratory system: Bilateral equal air entry, normal vesicular breath sounds, no wheezes or crackles  Cardiovascular system: S1 & S2 heard, RRR. No JVD, murmurs, rubs, gallops or clicks. No pedal edema. Gastrointestinal system: Abdomen is nondistended, soft and nontender. No organomegaly or masses felt. Normal  bowel sounds heard. Central nervous system: Alert and oriented. No focal neurological deficits. Extremities: No edema, no clubbing ,no cyanosis, distal peripheral pulses palpable. Skin: No rashes, lesions or ulcers,no icterus ,no pallor     Data Reviewed: I have personally reviewed following labs and imaging studies  CBC: Recent Labs  Lab 02/17/18 1928 02/18/18 0557  WBC 7.5 6.4  HGB 11.4* 12.3  HCT 33.7* 36.7  MCV 82.2 84.4  PLT PLATELET CLUMPS NOTED  ON SMEAR, UNABLE TO ESTIMATE 761   Basic Metabolic Panel: Recent Labs  Lab 02/17/18 1928 02/18/18 0232 02/18/18 0557 02/18/18 1457 02/19/18 0516  NA 135 141 140 139 137  K 4.0 3.4* 3.8 3.2* 3.7  CL 94* 105 102 104 102  CO2 18* 23 25 25 26   GLUCOSE 508* 274* 225* 220* 260*  BUN 26* 25* 26* 23* 14  CREATININE 0.91 0.67 0.47 0.54 0.50  CALCIUM 9.8 8.8* 9.3 8.5* 8.5*  MG  --   --  2.0  --   --   PHOS  --   --  2.4*  --   --    GFR: Estimated Creatinine Clearance: 95.2 mL/min (by C-G formula based on SCr of 0.5 mg/dL). Liver Function Tests: Recent Labs  Lab 02/18/18 0232 02/18/18 0557  AST 19 21  ALT 15 16  ALKPHOS 90 98  BILITOT 0.8 0.7  PROT 8.0 9.4*  ALBUMIN 3.7 4.4   No results for input(s): LIPASE, AMYLASE in the last 168 hours. No results for input(s): AMMONIA in the last 168 hours. Coagulation Profile: No results for input(s): INR, PROTIME in the last 168 hours. Cardiac Enzymes: Recent Labs  Lab 02/18/18 0232 02/18/18 0557  CKTOTAL 42 42   BNP (last 3 results) No results for input(s): PROBNP in the last 8760 hours. HbA1C: Recent Labs    02/19/18 0516  HGBA1C 12.0*   CBG: Recent Labs  Lab 02/18/18 1138 02/18/18 1631 02/18/18 2127 02/19/18 0625 02/19/18 0736  GLUCAP 191* 202* 219* 237* 251*   Lipid Profile: No results for input(s): CHOL, HDL, LDLCALC, TRIG, CHOLHDL, LDLDIRECT in the last 72 hours. Thyroid Function Tests: Recent Labs    02/18/18 0557  TSH 0.596   Anemia Panel: No results for input(s): VITAMINB12, FOLATE, FERRITIN, TIBC, IRON, RETICCTPCT in the last 72 hours. Sepsis Labs: No results for input(s): PROCALCITON, LATICACIDVEN in the last 168 hours.  No results found for this or any previous visit (from the past 240 hour(s)).       Radiology Studies: No results found.      Scheduled Meds: . insulin aspart  0-5 Units Subcutaneous QHS  . insulin aspart  0-9 Units Subcutaneous TID WC  . insulin glargine  15 Units  Subcutaneous Q24H  . metoCLOPramide (REGLAN) injection  10 mg Intravenous Q6H  . mirtazapine  15 mg Oral QHS  . pantoprazole (PROTONIX) IV  40 mg Intravenous Q24H  . sucralfate  1 g Oral TID WC & HS   Continuous Infusions: . sodium chloride 125 mL/hr at 02/19/18 0309     LOS: 0 days    Time spent: 25 mins.More than 50% of that time was spent in counseling and/or coordination of care.      Shelly Coss, MD [facility] Pager [phone]  If 7PM-7AM, please contact night-coverage [email or web address] Password [facility] 02/19/2018, 10:43 AM

## 2018-02-19 NOTE — Consult Note (Addendum)
Referring Provider:  Dr. Renford Dills, [facility] Primary Care Physician:  Patient, No Pcp Per Primary Gastroenterologist:  Dr. Adela Lank  Reason for Consultation:  N/V, abdominal pain, gastroparesis  HPI: [name] is a 27 y.o. female with a history of diabetes and gastroparesis, hypertension, chronic diarrhea, perirectal/perianal abscess, who follows with Dr. Adela Lank.  She was last seen in our office by one of our PA's on 01/18/2018.  She presented to [facility] ED late on 11/20 with complaints of nausea, vomiting, and abdominal pain for the past few days.  Reports that she had been without her lantus for 1 week while she was waiting for mail order pharmacy.  Has been unable to take PO meds at home.    Hgb A1C is up to 12 as compared to 5.7 just ten months ago.  Blood sugar was up to 529 in the ED.  10/05/2017 EGD normal with a medium amount of food in the stomach consistent with gastroparesis.  Normally at home she does well with pantoprazole 40 mg BID, phenergan which she reports that she had been taking in the morning and evening recently, carafate which she has just been taking prn recently because of needing to take it away from other medications, and remeron 15 mg at bedtime.    Here they have her on those medications (except only had PPI once a day) plus they have added reglan 10 mg IV every 6 hours, levsin 0.25 mg SL every 6 hours prn, ativan 0.5 mg every 6 hours prn, and zofran prn.  Says that the ativan just makes her sleepy so she has only received that twice.  Still having a lot of pain and vomiting clear liquids.   Past Medical History:  Diagnosis Date  . Allergy   . Anemia   . Anxiety   . Blood transfusion without reported diagnosis    Dec 2018  . Cataract    right eye  . Depression   . Diabetes type 1, uncontrolled (HCC) 11/14/2011   Since age 25  . Fibromyalgia   . Gastroparesis   . GERD (gastroesophageal reflux disease)   . Hypertension   . Infection    UTI April 2016     Past Surgical History:  Procedure Laterality Date  . ANKLE SURGERY    . CHOLECYSTECTOMY  11/15/2011   Procedure: LAPAROSCOPIC CHOLECYSTECTOMY WITH INTRAOPERATIVE CHOLANGIOGRAM;  Surgeon: [name], MD;  Location: [facility];  Service: General;  Laterality: N/A;  . COLONOSCOPY    . COLONOSCOPY WITH PROPOFOL N/A 06/27/2017   Procedure: COLONOSCOPY WITH PROPOFOL;  Surgeon: Rachael Fee, MD;  Location: [facility];  Service: Endoscopy;  Laterality: N/A;  . ESOPHAGOGASTRODUODENOSCOPY  12/03/2011   Procedure: ESOPHAGOGASTRODUODENOSCOPY (EGD);  Surgeon: Theda Belfast, MD;  Location: [facility];  Service: Endoscopy;  Laterality: N/A;  . FLEXIBLE SIGMOIDOSCOPY N/A 03/10/2017   Procedure: FLEXIBLE SIGMOIDOSCOPY;  Surgeon: Jeani Hawking, MD;  Location: [facility];  Service: Endoscopy;  Laterality: N/A;  . INCISION AND DRAINAGE PERIRECTAL ABSCESS N/A 03/01/2017   Procedure: IRRIGATION AND DEBRIDEMENT PERIRECTAL ABSCESS;  Surgeon: Ovidio Kin, MD;  Location: [facility];  Service: General;  Laterality: N/A;  . IRRIGATION AND DEBRIDEMENT BUTTOCKS N/A 03/23/2017   Procedure: IRRIGATION AND DEBRIDEMENT BUTTOCKS, SETON PLACEMENT;  Surgeon: Romie Levee, MD;  Location: [facility];  Service: General;  Laterality: N/A;  . LAPAROSCOPY  11/23/2011   Procedure: LAPAROSCOPY DIAGNOSTIC;  Surgeon: Mariella Saa, MD;  Location: [facility];  Service: General;  Laterality: N/A;  .  SIGMOIDOSCOPY    . UPPER GASTROINTESTINAL ENDOSCOPY    . WISDOM TOOTH EXTRACTION      Prior to Admission medications   Medication Sig Start Date End Date Taking? Authorizing Provider  acetaminophen (TYLENOL) 500 MG tablet Take 1,000 mg by mouth daily as needed for moderate pain.    Yes [provider]  citalopram (CELEXA) 20 MG tablet Take 1 tablet (20 mg total) by mouth daily. 07/13/17  Yes Emokpae, Courage, MD  diclofenac sodium (VOLTAREN) 1 % GEL Apply 2 g topically 4 (four) times daily. 08/01/17  Yes Mikhail, [name], DO   dicyclomine (BENTYL) 20 MG tablet Take 1 tablet (20 mg total) by mouth 2 (two) times daily. Patient taking differently: Take 20 mg by mouth 2 (two) times daily as needed for spasms.  12/25/17  Yes Glade Lloyd, MD  ferrous sulfate 325 (65 FE) MG tablet Take 1 tablet (325 mg total) by mouth 2 (two) times daily with a meal. 05/13/17  Yes Henson, Vickie L, NP-C  insulin lispro (HUMALOG) 100 UNIT/ML injection Inject 0-0.09 mLs (0-9 Units total) into the skin 3 (three) times daily with meals. 12/25/17  Yes Glade Lloyd, MD  LANTUS 100 UNIT/ML injection Inject 0.12 mLs (12 Units total) into the skin daily. Patient taking differently: Inject 12 Units into the skin at bedtime.  01/12/18  Yes Romero Belling, MD  metoCLOPramide (REGLAN) 10 MG tablet Take 1 tablet (10 mg total) by mouth every 6 (six) hours as needed for nausea or vomiting. 12/25/17  Yes Glade Lloyd, MD  mirtazapine (REMERON) 15 MG tablet Take 1 tablet (15 mg total) by mouth at bedtime. 01/12/18  Yes Armbruster, Willaim Rayas, MD  ondansetron (ZOFRAN ODT) 4 MG disintegrating tablet Take 1 tablet (4 mg total) by mouth every 8 (eight) hours as needed for nausea or vomiting. 12/25/17  Yes Glade Lloyd, MD  pantoprazole (PROTONIX) 40 MG tablet Take 1 tablet (40 mg total) by mouth 2 (two) times daily. 12/25/17  Yes Glade Lloyd, MD  promethazine (PHENERGAN) 12.5 MG tablet Take 1 tablet (12.5 mg total) by mouth every 8 (eight) hours as needed for nausea or vomiting. 02/11/18  Yes Armbruster, Willaim Rayas, MD  promethazine (PHENERGAN) 25 MG suppository Place 1 suppository (25 mg total) rectally every 12 (twelve) hours as needed for refractory nausea / vomiting. 12/25/17  Yes Glade Lloyd, MD  sucralfate (CARAFATE) 1 g tablet Take 1 tablet (1 g total) by mouth 4 (four) times daily -  with meals and at bedtime. 09/17/17  Yes Mancel Bale, MD    Current Facility-Administered Medications  Medication Dose Route Frequency Provider Last Rate Last Dose  . 0.9  %  sodium chloride infusion   Intravenous Continuous Burnadette Pop, MD 125 mL/hr at 02/19/18 0309    . acetaminophen (TYLENOL) tablet 650 mg  650 mg Oral Q6H PRN Therisa Doyne, MD       Or  . acetaminophen (TYLENOL) suppository 650 mg  650 mg Rectal Q6H PRN Doutova, Anastassia, MD      . HYDROcodone-acetaminophen (NORCO/VICODIN) 5-325 MG per tablet 1-2 tablet  1-2 tablet Oral Q4H PRN Doutova, Anastassia, MD      . hyoscyamine (LEVSIN SL) SL tablet 0.25 mg  0.25 mg Sublingual Q6H PRN Blount, Xenia T, NP   0.25 mg at 02/19/18 0006  . insulin aspart (novoLOG) injection 0-5 Units  0-5 Units Subcutaneous QHS Briscoe Deutscher, MD   2 Units at 02/18/18 2147  . insulin aspart (novoLOG) injection 0-9 Units  0-9  Units Subcutaneous TID WC Opyd, Lavone Neri, MD   5 Units at 02/19/18 [phone]  . insulin glargine (LANTUS) injection 15 Units  15 Units Subcutaneous Q24H Burnadette Pop, MD   15 Units at 02/19/18 [phone]  . ketorolac (TORADOL) 15 MG/ML injection 15 mg  15 mg Intravenous Q8H PRN Therisa Doyne, MD   15 mg at 02/18/18 0103  . labetalol (NORMODYNE,TRANDATE) injection 5 mg  5 mg Intravenous Q2H PRN Burnadette Pop, MD   5 mg at 02/18/18 1334  . LORazepam (ATIVAN) injection 0.5 mg  0.5 mg Intravenous Q6H PRN Therisa Doyne, MD   0.5 mg at 02/19/18 0838  . metoCLOPramide (REGLAN) injection 10 mg  10 mg Intravenous Q6H Burnadette Pop, MD   10 mg at 02/19/18 0617  . mirtazapine (REMERON) tablet 15 mg  15 mg Oral QHS Doutova, Anastassia, MD      . morphine 2 MG/ML injection 2 mg  2 mg Intravenous Q4H PRN Burnadette Pop, MD   2 mg at 02/19/18 0507  . ondansetron (ZOFRAN) tablet 4 mg  4 mg Oral Q6H PRN Doutova, Anastassia, MD       Or  . ondansetron (ZOFRAN) injection 4 mg  4 mg Intravenous Q6H PRN Doutova, Anastassia, MD   4 mg at 02/19/18 0017  . pantoprazole (PROTONIX) injection 40 mg  40 mg Intravenous Q24H Burnadette Pop, MD   40 mg at 02/18/18 1333  . promethazine (PHENERGAN) injection  12.5-25 mg  12.5-25 mg Intravenous Q6H PRN Opyd, Lavone Neri, MD   12.5 mg at 02/19/18 0324  . sucralfate (CARAFATE) tablet 1 g  1 g Oral TID WC & HS Therisa Doyne, MD   Stopped at 02/18/18 0800  . traMADol (ULTRAM) tablet 50 mg  50 mg Oral Q6H PRN Therisa Doyne, MD        Allergies as of 02/17/2018 - Review Complete 02/17/2018  Allergen Reaction Noted  . Other Anaphylaxis 12/13/2011  . Lactose intolerance (gi) Diarrhea 01/22/2015    Family History  Problem Relation Age of Onset  . Diabetes Mother   . Hypertension Father   . Colon cancer Paternal Grandmother        pt thinks PGM was dx in her 61's  . Esophageal cancer Neg Hx   . Liver cancer Neg Hx   . Pancreatic cancer Neg Hx   . Stomach cancer Neg Hx   . Rectal cancer Neg Hx     Social History   Socioeconomic History  . Marital status: Married    Spouse name: sergio  . Number of children: 0  . Years of education: college  . Highest education level: Not on file  Occupational History  . Occupation: Equities trader call center    Employer: [employer]  Social Needs  . Financial resource strain: Not on file  . Food insecurity:    Worry: Not on file    Inability: Not on file  . Transportation needs:    Medical: Not on file    Non-medical: Not on file  Tobacco Use  . Smoking status: Never Smoker  . Smokeless tobacco: Never Used  Substance and Sexual Activity  . Alcohol use: No    Alcohol/week: 0.0 standard drinks    Frequency: Never  . Drug use: No  . Sexual activity: Not Currently    Partners: Male    Birth control/protection: None  Lifestyle  . Physical activity:    Days per week: Not on file    Minutes per session: Not on  file  . Stress: Not on file  Relationships  . Social connections:    Talks on phone: Not on file    Gets together: Not on file    Attends religious service: Not on file    Active member of club or organization: Not on file    Attends meetings of clubs or organizations:  Not on file    Relationship status: Not on file  . Intimate partner violence:    Fear of current or ex partner: Not on file    Emotionally abused: Not on file    Physically abused: Not on file    Forced sexual activity: Not on file  Other Topics Concern  . Not on file  Social History Narrative  . Not on file    Review of Systems: ROS is O/W negative except as mentioned in HPI.  Physical Exam: Vital signs in last 24 hours: Temp:  [98.4 F (36.9 C)-99.2 F (37.3 C)] 98.4 F (36.9 C) (11/22 0452) Pulse Rate:  [105-121] 109 (11/22 0452) Resp:  [11-18] 16 (11/22 0452) BP: (141-183)/(93-115) 163/102 (11/22 0452) SpO2:  [98 %-100 %] 98 % (11/22 0452) Weight:  [57.6 kg] 57.6 kg (11/21 1553) Last BM Date: 02/17/18 General:  Alert, Well-developed, well-nourished, and cooperative.  Appears uncomfortable. Head:  Normocephalic and atraumatic. Eyes:  Sclera clear, no icterus.  Conjunctiva pink. Ears:  Normal auditory acuity. Mouth:  No deformity or lesions.   Lungs:  Clear throughout to auscultation.  No wheezes, crackles, or rhonchi.  Heart:  Tachy. Abdomen:  Soft, non-distended.  BS present.  TTP in upper abdomen. Msk:  Symmetrical without gross deformities. Pulses:  Normal pulses noted. Extremities:  Without clubbing or edema. Neurologic:  Alert and oriented x 4;  grossly normal neurologically. Skin:  Intact without significant lesions or rashes. Psych:  Alert and cooperative. Normal mood and affect.  Intake/Output from previous day: 11/21 0701 - 11/22 0700 In: 4007.3 [P.O.:720; I.V.:2787.3; IV Piggyback:500] Out: 254 [Urine:254]  Lab Results: Recent Labs    02/17/18 1928 02/18/18 0557  WBC 7.5 6.4  HGB 11.4* 12.3  HCT 33.7* 36.7  PLT PLATELET CLUMPS NOTED ON SMEAR, UNABLE TO ESTIMATE 243   BMET Recent Labs    02/18/18 0557 02/18/18 1457 02/19/18 0516  NA 140 139 137  K 3.8 3.2* 3.7  CL 102 104 102  CO2 25 25 26   GLUCOSE 225* 220* 260*  BUN 26* 23* 14   CREATININE 0.47 0.54 0.50  CALCIUM 9.3 8.5* 8.5*   LFT Recent Labs    02/18/18 0232 02/18/18 0557  PROT 8.0 9.4*  ALBUMIN 3.7 4.4  AST 19 21  ALT 15 16  ALKPHOS 90 98  BILITOT 0.8 0.7  BILIDIR 0.1  --   IBILI 0.7  --    IMPRESSION:  1.  Gastroparesis: Chronic nausea and vomiting as well as epigastric pain, multiple normal EGDs other than findings of gastroparesis, patient does fairly well on a regimen of Phenergan BID, Sucralfate prn, Pantoprazole BID, and Mirtazapine in the evenings, though does have exacerbations.  This exacerbation caused by missing her lantus for one week and having blood sugar over 500 in the ED.  PLAN: -Agree with current regimen for now except I did increase her PPI to BID and I made the levsin a standing order of 0.125 mg every 6 hours as well as zofran ATC every 6 hours. -Likely will just need some more time for symptoms to calm down. -Minimize narcotic use. -She does have  an appt at [facility] on 12/23 that she is aware of and needs to make it to that appt.   Princella Pellegrini. Zehr  02/19/2018, 10:00 AM     Attending Physician Note   I have taken a history, examined the patient and reviewed the chart. I agree with the Advanced Practitioner's note, impression and recommendations.   Exacerbation of gastroparesis due to poor DM control. Optimize DM control now and over the long term for optimal control of gastroparesis. Continue current medications however improvement in gastroparesis could be days after blood sugars under appropriate control. She should follow through with [facility] referral as scheduled. No additional GI evaluation is recommended at this time. GI available if needed.    Claudette Head, MD FACG [phone]

## 2018-02-19 NOTE — Progress Notes (Signed)
Initial Nutrition Assessment  DOCUMENTATION CODES:   (Will assess for malnutrition at follow-up)  INTERVENTION:  - Will order Boost Breeze BID, each supplement provides 250 kcal and 9 grams of protein. - Will order daily multivitamin with minerals.  - Diet advancement as medically feasible.  - Continue to encourage PO intakes.    NUTRITION DIAGNOSIS:   Inadequate oral intake related to acute illness, nausea, vomiting as evidenced by per patient/family report, meal completion < 25%.  GOAL:   Patient will meet greater than or equal to 90% of their needs  MONITOR:   PO intake, Supplement acceptance, Weight trends, Labs  REASON FOR ASSESSMENT:   Consult Assessment of nutrition requirement/status, Malnutrition Eval  ASSESSMENT:   27 year old female with past medical history of Type 1 DM, diabetic gastroparesis, HTN, and chronic diarrhea. She presented to the ED with complaints of nausea, vomiting, and abdominal pain for last 2-3 days. On presentation to the ED, she was found to be in DKA.  Reported that she was out of insulin for the past week as it was not delivered.  BMI indicates normal weight. Patient on CLD and reports drinking diet Sprite, diet ginger ale, and water today. She is unable to recall the last time she consumed solid food. At home, she was drinking liquids, including Gatorade, but liquids would often come back up. She reports that last episode of emesis was a few hours ago. Patient continues to feel nauseated and very poorly overall.   Defer NFPE at this time. Per chart review, current weight is 127 lb and weight on 9/23 was 131 lb. This indicates 4 lb weight loss (3% body weight) in the past 2 months; not significant for time frame.    Medications reviewed; sliding scale Novolog, 15 units Lantus/day, 10 mg IV Reglan QID, 40 mg IV Protonix BID, 10 mEq IV KCl x5 runs 11/21, 1 g Carafate TID.  Labs reviewed; CBGs: 237, 251, and 104 mg/dL today, Ca: 8.5 mg/dL. IVF;  NS @ 125 mL/hr.     NUTRITION - FOCUSED PHYSICAL EXAM:  Unable; will attempt at follow-up.  Diet Order:   Diet Order            Diet clear liquid Room service appropriate? Yes; Fluid consistency: Thin  Diet effective now              EDUCATION NEEDS:   Not appropriate for education at this time  Skin:  Skin Assessment: Reviewed RN Assessment  Last BM:  11/21  Height:   Ht Readings from Last 1 Encounters:  02/18/18 5\' 7"  (1.702 m)    Weight:   Wt Readings from Last 1 Encounters:  02/18/18 57.6 kg    Ideal Body Weight:  61.36 kg  BMI:  Body mass index is 19.89 kg/m.  Estimated Nutritional Needs:   Kcal:  1730-1900 kcal  Protein:  70-80 grams  Fluid:  >/= 2 L/day      Trenton Gammon, MS, RD, LDN, [facility] Inpatient Clinical Dietitian Pager # [phone] After hours/weekend pager # [phone]

## 2018-02-20 LAB — GLUCOSE, CAPILLARY
Glucose-Capillary: 147 mg/dL — ABNORMAL HIGH (ref 70–99)
Glucose-Capillary: 96 mg/dL (ref 70–99)

## 2018-02-20 MED ORDER — PANTOPRAZOLE SODIUM 40 MG PO TBEC: 40.0000 mg | DELAYED_RELEASE_TABLET | Freq: Two times a day (BID) | ORAL | 0 refills | Status: DC

## 2018-02-20 MED ORDER — MORPHINE SULFATE (PF) 2 MG/ML IV SOLN
1.0000 mg | Freq: Four times a day (QID) | INTRAVENOUS | Status: DC | PRN
  Administered 2018-02-20: 1 mg via INTRAVENOUS
  Filled 2018-02-20: qty 1

## 2018-02-20 MED ORDER — METOCLOPRAMIDE HCL 5 MG/ML IJ SOLN: 10.0000 mg | Freq: Four times a day (QID) | INTRAMUSCULAR | Status: DC | PRN

## 2018-02-20 MED ORDER — LANTUS 100 UNIT/ML ~~LOC~~ SOLN: 12.0000 [IU] | Freq: Every day | SUBCUTANEOUS | 0 refills | Status: DC

## 2018-02-20 MED ORDER — SODIUM CHLORIDE 0.9 % IV SOLN: INTRAVENOUS | Status: DC

## 2018-02-20 MED ORDER — HYOSCYAMINE SULFATE 0.125 MG SL SUBL: 0.1250 mg | SUBLINGUAL_TABLET | Freq: Four times a day (QID) | SUBLINGUAL | 0 refills | Status: DC | PRN

## 2018-02-20 MED ORDER — METOCLOPRAMIDE HCL 10 MG PO TABS: 10.0000 mg | ORAL_TABLET | Freq: Four times a day (QID) | ORAL | 0 refills | Status: DC | PRN

## 2018-02-20 MED ORDER — INSULIN LISPRO 100 UNIT/ML ~~LOC~~ SOLN: 0.0000 [IU] | Freq: Three times a day (TID) | SUBCUTANEOUS | 0 refills | Status: DC

## 2018-02-20 NOTE — Progress Notes (Signed)
Kathryn NOTE    [name]  DEY:[id number] DOB: 1990/10/30 DOA: 02/17/2018 PCP: Patient, No Pcp Per   Brief Narrative: Patient is a 27 year old female with past medical history of diabetes type 1 on insulin, diabetic gastroparesis, hypertension, chronic diarrhea who presented to the emergency room with complaints of nausea, vomiting and abdominal pain for last 2 to 3 days.  She was found to be in DKA.  Reported that she was out on her insulin for last week as it was not delivered. Patient is started on insulin drip, antiemetics and pain medication for the diabetic gastroparesis. She is slowly improving but has not improved to the point that she can be discharged home.   Assessment & Plan:   Active Problems:   Chronic abdominal pain   Hypertension associated with diabetes ([place])   Gastroparesis   Diabetes mellitus type 1 (HCC)   Protein-calorie malnutrition, severe (HCC)   DKA, type 1 (HCC)   Nausea and vomiting   Essential hypertension   Intractable nausea and vomiting   GERD (gastroesophageal reflux disease)   Normocytic anemia   DKA (diabetic ketoacidoses) (HCC)   Diabetic ketoacidosis: Reported to be not on Lantus for last 1 week.  Started on DKA protocol.  Insulin drip has  already been stopped at the gap is closed.  Will advance the diet to full liquid today ,continue IV fluids. Diabetic coordinator following.Hemoglobin A1c level was 12.  She follows with endocrinology as an outpatient.  She says she is compliant with insulin. Continue long-acting insulin and sliding scale insulin.  Diabetic gastroparesis: Presented with nausea, vomiting and abdominal pain.  Still having the symptoms but slowly improving she has been set up follow-up at [facility] for possible gastric pacing which she has not made yet.She has an appointment on 12/23. Continue Reglan,zofran and hyoscyamine. Continue pain medication.  She should be on  small volume, low-fat diet frequently when she improves. GI  was following. Abdominal pain: This is most likely secondary from her diabetic gastroparesis.  She also has history of chronic abdominal pain.Continue pain meds.  Hypertension: No history of documented hypertension.  She was hypertensive most likely secondary to abdominal pain, nausea and vomiting .Conitnue PRN regimen.  Protein calorie malnutrition: Nutrition consulted  GERD: Continue Protonix BID  Depression: Continue Rameron.     DVT prophylaxis: SCD Code Status: Full Family Communication: None present at the bedside Disposition Plan: Home after further  improvement in the nausea, vomiting abdominal pain.  Was considering  discharge planning today but she did not tolerate the advanced diet so we will monitor for 1 more day.  Consultants: None   Procedures: None  Antimicrobials: None  Subjective: Patient seen and examined the bedside.  Improvement in nausea, vomiting abdominal pain continues to remain weak and has very poor appetite.   Objective: Vitals:   02/19/18 1710 02/19/18 2112 02/19/18 2300 02/20/18 0434  BP: (!) 137/100 (!) 181/108 (!) 156/90 132/84  Pulse:  (!) 116 (!) 101 93  Resp:  18  17  Temp:  98.1 F (36.7 C)  97.8 F (36.6 C)  TempSrc:  Oral  Oral  SpO2:  100%  97%  Weight:      Height:        Intake/Output Summary (Last 24 hours) at 02/20/2018 1048 Last data filed at 02/20/2018 0600 Gross per 24 hour  Intake 3258.53 ml  Output -  Net 3258.53 ml   Filed Weights   02/18/18 1553  Weight: 57.6 kg  Examination:  General exam: Weak, thin built, debilitated HEENT:PERRL,Oral mucosa moist, Ear/Nose normal on gross exam Respiratory system: Bilateral equal air entry, normal vesicular breath sounds, no wheezes or crackles  Cardiovascular system: S1 & S2 heard, RRR. No JVD, murmurs, rubs, gallops or clicks. Gastrointestinal system: Abdomen is nondistended, soft and nontender. No organomegaly or masses felt. Normal bowel sounds heard. Central  nervous system: Alert and oriented. No focal neurological deficits. Extremities: No edema, no clubbing ,no cyanosis, distal peripheral pulses palpable. Skin: No rashes, lesions or ulcers,no icterus ,no pallor   Data Reviewed: I have personally reviewed following labs and imaging studies  CBC: Recent Labs  Lab 02/17/18 1928 02/18/18 0557  WBC 7.5 6.4  HGB 11.4* 12.3  HCT 33.7* 36.7  MCV 82.2 84.4  PLT PLATELET CLUMPS NOTED ON SMEAR, UNABLE TO ESTIMATE 267   Basic Metabolic Panel: Recent Labs  Lab 02/17/18 1928 02/18/18 0232 02/18/18 0557 02/18/18 1457 02/19/18 0516  NA 135 141 140 139 137  K 4.0 3.4* 3.8 3.2* 3.7  CL 94* 105 102 104 102  CO2 18* 23 25 25 26   GLUCOSE 508* 274* 225* 220* 260*  BUN 26* 25* 26* 23* 14  CREATININE 0.91 0.67 0.47 0.54 0.50  CALCIUM 9.8 8.8* 9.3 8.5* 8.5*  MG  --   --  2.0  --   --   PHOS  --   --  2.4*  --   --    GFR: Estimated Creatinine Clearance: 95.2 mL/min (by C-G formula based on SCr of 0.5 mg/dL). Liver Function Tests: Recent Labs  Lab 02/18/18 0232 02/18/18 0557  AST 19 21  ALT 15 16  ALKPHOS 90 98  BILITOT 0.8 0.7  PROT 8.0 9.4*  ALBUMIN 3.7 4.4   No results for input(s): LIPASE, AMYLASE in the last 168 hours. No results for input(s): AMMONIA in the last 168 hours. Coagulation Profile: No results for input(s): INR, PROTIME in the last 168 hours. Cardiac Enzymes: Recent Labs  Lab 02/18/18 0232 02/18/18 0557  CKTOTAL 42 42   BNP (last 3 results) No results for input(s): PROBNP in the last 8760 hours. HbA1C: Recent Labs    02/19/18 0516  HGBA1C 12.0*   CBG: Recent Labs  Lab 02/19/18 0736 02/19/18 1228 02/19/18 1621 02/19/18 2107 02/20/18 0801  GLUCAP 251* 104* 76 126* 147*   Lipid Profile: No results for input(s): CHOL, HDL, LDLCALC, TRIG, CHOLHDL, LDLDIRECT in the last 72 hours. Thyroid Function Tests: Recent Labs    02/18/18 0557  TSH 0.596   Anemia Panel: No results for input(s): VITAMINB12,  FOLATE, FERRITIN, TIBC, IRON, RETICCTPCT in the last 72 hours. Sepsis Labs: No results for input(s): PROCALCITON, LATICACIDVEN in the last 168 hours.  No results found for this or any previous visit (from the past 240 hour(s)).       Radiology Studies: No results found.      Scheduled Meds: . feeding supplement  1 Container Oral BID BM  . hyoscyamine  0.125 mg Sublingual Q6H  . insulin aspart  0-5 Units Subcutaneous QHS  . insulin aspart  0-9 Units Subcutaneous TID WC  . insulin glargine  15 Units Subcutaneous Q24H  . metoCLOPramide (REGLAN) injection  10 mg Intravenous Q6H  . mirtazapine  15 mg Oral QHS  . multivitamin with minerals  1 tablet Oral Daily  . ondansetron (ZOFRAN) IV  4 mg Intravenous Q6H   Or  . ondansetron  4 mg Oral Q6H  . pantoprazole (PROTONIX) IV  40  mg Intravenous Q12H  . sucralfate  1 g Oral TID WC & HS   Continuous Infusions:    LOS: 1 day    Time spent: 25 mins.More than 50% of that time was spent in counseling and/or coordination of care.      Shelly Coss, MD [facility] Pager [phone]  If 7PM-7AM, please contact night-coverage [email or web address] Password [facility] 02/20/2018, 10:48 AM

## 2018-02-20 NOTE — Discharge Summary (Signed)
Physician Discharge Summary  [name] AYT:[id number] DOB: 1990/06/10 DOA: 02/17/2018  PCP: Patient, No Pcp Per  Admit date: 02/17/2018 Discharge date: 02/20/2018  Admitted From: Home Disposition:  Home  Discharge Condition:Stable CODE STATUS:FULL Diet recommendation: Carb Modified  Brief/Interim Summary: Patient is a 27 year old female with past medical history of diabetes type 1 on insulin, diabetic gastroparesis, hypertension, chronic diarrhea who presented to the emergency room with complaints of nausea, vomiting and abdominal pain for last 2 to 3 days.  She was found to be in DKA.  Reported that she was out on her insulin for last week as it was not delivered. Patient is started on insulin drip, antiemetics and pain medication for the diabetic gastroparesis. She  slowly improved. Today her nausea, abdominal pain and vomiting has improved.  She has tolerated the advance diet.  She is stable for discharge home today   Following problems were addressed during her hospitalization :   Diabetic ketoacidosis: Reported to be not on Lantus for last 1 week.  Started on DKA protocol.  Insulin drip has  already been stopped at the gap is closed.  Diabetic coordinator following.Hemoglobin A1c level was 12.  She follows with endocrinology as an outpatient.  She says she is compliant with insulin. Continue long-acting insulin and sliding scale insulin at home.  Diabetic gastroparesis: Presented with nausea, vomiting and abdominal pain.  Still having the symptoms but slowly improving she has been set up follow-up at [facility] for possible gastric pacing which she has not made yet.She has an appointment on 12/23. Continue Reglan and hyoscyamine.   She should be on  small volume, low-fat diet frequently.GI was following.  Abdominal pain: This is most likely secondary from her diabetic gastroparesis.  She also has history of chronic abdominal pain.  Hypertension: No history of documented  hypertension.  She was hypertensive most likely secondary to abdominal pain, nausea and vomiting .Much improved.  Protein calorie malnutrition: Nutrition consulted  GERD: Continue Protonix BID  Depression: Continue Rameron   Discharge Diagnoses:  Active Problems:   Chronic abdominal pain   Hypertension associated with diabetes ([place])   Gastroparesis   Diabetes mellitus type 1 (HCC)   Protein-calorie malnutrition, severe (HCC)   DKA, type 1 (HCC)   Nausea and vomiting   Essential hypertension   Intractable nausea and vomiting   GERD (gastroesophageal reflux disease)   Normocytic anemia   DKA (diabetic ketoacidoses) (HCC)    Discharge Instructions  Discharge Instructions    Diet Carb Modified   Complete by:  As directed    Take small volume, low-fat frequent meals   Discharge instructions   Complete by:  As directed    1) Continue taking insulin at home.  Monitor blood sugars. 2)Follow up with your PCP in a week.  Do a CBC, BMP test during the follow-up.  Check A1c in 3 months.Marland Kitchen 3)Follow up with your endocrinologist as an outpatient. 4)Follow up with gastroenterology at [facility].  You have that appointment.   Increase activity slowly   Complete by:  As directed      Allergies as of 02/20/2018      Reactions   Other Anaphylaxis   Reaction to [place] nuts   Lactose Intolerance (gi) Diarrhea      Medication List    TAKE these medications   acetaminophen 500 MG tablet Commonly known as:  TYLENOL Take 1,000 mg by mouth daily as needed for moderate pain.   citalopram 20 MG tablet Commonly known  as:  CELEXA Take 1 tablet (20 mg total) by mouth daily.   diclofenac sodium 1 % Gel Commonly known as:  VOLTAREN Apply 2 g topically 4 (four) times daily.   dicyclomine 20 MG tablet Commonly known as:  BENTYL Take 1 tablet (20 mg total) by mouth 2 (two) times daily. What changed:    when to take this  reasons to take this   ferrous sulfate 325 (65  FE) MG tablet Take 1 tablet (325 mg total) by mouth 2 (two) times daily with a meal.   hyoscyamine 0.125 MG SL tablet Commonly known as:  LEVSIN SL Place 1 tablet (0.125 mg total) under the tongue every 6 (six) hours as needed for cramping (abdominal pain).   insulin lispro 100 UNIT/ML injection Commonly known as:  HUMALOG Inject 0-0.09 mLs (0-9 Units total) into the skin 3 (three) times daily with meals.   LANTUS 100 UNIT/ML injection Generic drug:  insulin glargine Inject 0.12 mLs (12 Units total) into the skin daily. What changed:  when to take this   metoCLOPramide 10 MG tablet Commonly known as:  REGLAN Take 1 tablet (10 mg total) by mouth every 6 (six) hours as needed for nausea or vomiting.   mirtazapine 15 MG tablet Commonly known as:  REMERON Take 1 tablet (15 mg total) by mouth at bedtime.   ondansetron 4 MG disintegrating tablet Commonly known as:  ZOFRAN-ODT Take 1 tablet (4 mg total) by mouth every 8 (eight) hours as needed for nausea or vomiting.   pantoprazole 40 MG tablet Commonly known as:  PROTONIX Take 1 tablet (40 mg total) by mouth 2 (two) times daily.   promethazine 25 MG suppository Commonly known as:  PHENERGAN Place 1 suppository (25 mg total) rectally every 12 (twelve) hours as needed for refractory nausea / vomiting.   promethazine 12.5 MG tablet Commonly known as:  PHENERGAN Take 1 tablet (12.5 mg total) by mouth every 8 (eight) hours as needed for nausea or vomiting.   sucralfate 1 g tablet Commonly known as:  CARAFATE Take 1 tablet (1 g total) by mouth 4 (four) times daily -  with meals and at bedtime.      Follow-up Information    Primary Care physician. Schedule an appointment as soon as possible for a visit.          Allergies  Allergen Reactions  . Other Anaphylaxis    Reaction to [place] nuts   . Lactose Intolerance (Gi) Diarrhea    Consultations: GI  Procedures/Studies:  No results found.    Subjective: Patient  seen and examined the pressure this morning.  She was still little bit nauseous but has not vomited.  Abdominal pain is better.  We  advanced her diet to carbohydrate consistent and she tolerated that.  She will be discharged to home today  Discharge Exam: Vitals:   02/20/18 0434 02/20/18 1425  BP: 132/84 (!) 127/92  Pulse: 93 (!) 115  Resp: 17 18  Temp: 97.8 F (36.6 C) 98.1 F (36.7 C)  SpO2: 97% 100%   Vitals:   02/19/18 2112 02/19/18 2300 02/20/18 0434 02/20/18 1425  BP: (!) 181/108 (!) 156/90 132/84 (!) 127/92  Pulse: (!) 116 (!) 101 93 (!) 115  Resp: 18  17 18   Temp: 98.1 F (36.7 C)  97.8 F (36.6 C) 98.1 F (36.7 C)  TempSrc: Oral  Oral Oral  SpO2: 100%  97% 100%  Weight:      Height:  General: Pt is alert, awake, not in acute distress,thin Cardiovascular: RRR, S1/S2 +, no rubs, no gallops Respiratory: CTA bilaterally, no wheezing, no rhonchi Abdominal: Soft, NT, ND, bowel sounds + Extremities: no edema, no cyanosis    The results of significant diagnostics from this hospitalization (including imaging, microbiology, ancillary and laboratory) are listed below for reference.     Microbiology: No results found for this or any previous visit (from the past 240 hour(s)).   Labs: BNP (last 3 results) No results for input(s): BNP in the last 8760 hours. Basic Metabolic Panel: Recent Labs  Lab 02/17/18 1928 02/18/18 0232 02/18/18 0557 02/18/18 1457 02/19/18 0516  NA 135 141 140 139 137  K 4.0 3.4* 3.8 3.2* 3.7  CL 94* 105 102 104 102  CO2 18* 23 25 25 26   GLUCOSE 508* 274* 225* 220* 260*  BUN 26* 25* 26* 23* 14  CREATININE 0.91 0.67 0.47 0.54 0.50  CALCIUM 9.8 8.8* 9.3 8.5* 8.5*  MG  --   --  2.0  --   --   PHOS  --   --  2.4*  --   --    Liver Function Tests: Recent Labs  Lab 02/18/18 0232 02/18/18 0557  AST 19 21  ALT 15 16  ALKPHOS 90 98  BILITOT 0.8 0.7  PROT 8.0 9.4*  ALBUMIN 3.7 4.4   No results for input(s): LIPASE, AMYLASE in  the last 168 hours. No results for input(s): AMMONIA in the last 168 hours. CBC: Recent Labs  Lab 02/17/18 1928 02/18/18 0557  WBC 7.5 6.4  HGB 11.4* 12.3  HCT 33.7* 36.7  MCV 82.2 84.4  PLT PLATELET CLUMPS NOTED ON SMEAR, UNABLE TO ESTIMATE 243   Cardiac Enzymes: Recent Labs  Lab 02/18/18 0232 02/18/18 0557  CKTOTAL 42 42   BNP: Invalid input(s): POCBNP CBG: Recent Labs  Lab 02/19/18 1228 02/19/18 1621 02/19/18 2107 02/20/18 0801 02/20/18 1109  GLUCAP 104* 76 126* 147* 96   D-Dimer No results for input(s): DDIMER in the last 72 hours. Hgb A1c Recent Labs    02/19/18 0516  HGBA1C 12.0*   Lipid Profile No results for input(s): CHOL, HDL, LDLCALC, TRIG, CHOLHDL, LDLDIRECT in the last 72 hours. Thyroid function studies Recent Labs    02/18/18 0557  TSH 0.596   Anemia work up No results for input(s): VITAMINB12, FOLATE, FERRITIN, TIBC, IRON, RETICCTPCT in the last 72 hours. Urinalysis    Component Value Date/Time   COLORURINE YELLOW 02/18/2018 [place] 02/18/2018 1353   LABSPEC 1.022 02/18/2018 1353   PHURINE 5.0 02/18/2018 1353   GLUCOSEU >=500 (A) 02/18/2018 1353   HGBUR MODERATE (A) 02/18/2018 1353   BILIRUBINUR NEGATIVE 02/18/2018 1353   BILIRUBINUR negative 01/02/2015 1717   KETONESUR 80 (A) 02/18/2018 1353   PROTEINUR 100 (A) 02/18/2018 1353   UROBILINOGEN 0.2 01/13/2015 0223   NITRITE NEGATIVE 02/18/2018 1353   LEUKOCYTESUR SMALL (A) 02/18/2018 1353   Sepsis Labs Invalid input(s): PROCALCITONIN,  WBC,  LACTICIDVEN Microbiology No results found for this or any previous visit (from the past 240 hour(s)).  Please note: You were cared for by a hospitalist during your hospital stay. Once you are discharged, your primary care physician will handle any further medical issues. Please note that NO REFILLS for any discharge medications will be authorized once you are discharged, as it is imperative that you return to your primary care  physician (or establish a relationship with a primary care physician if you do  not have one) for your post hospital discharge needs so that they can reassess your need for medications and monitor your lab values.    Time coordinating discharge: 40 minutes  SIGNED:   Shelly Coss, MD  [facility] 02/20/2018, 3:43 PM Pager [phone]  If 7PM-7AM, please contact night-coverage [email or web address] Password [email or web address]

## 2018-02-20 NOTE — Progress Notes (Signed)
1 mg of morphine wasted with Donetta Potts.

## 2018-03-09 ENCOUNTER — Other Ambulatory Visit: Payer: Self-pay | Admitting: Gastroenterology

## 2018-03-10 NOTE — Telephone Encounter (Signed)
Ok to refill? She saw Victorino Dike 01-18-18. We gave her #90 last month with no refills.  How much would you like to approve? Thanks.

## 2018-03-10 NOTE — Telephone Encounter (Signed)
You can give 3 refills. Thanks Jan

## 2018-03-22 ENCOUNTER — Inpatient Hospital Stay (HOSPITAL_COMMUNITY)
Admission: EM | Admit: 2018-03-22 | Discharge: 2018-03-25 | DRG: 638 | Disposition: A | Discharge status: Home / Self Care | Payer: 59 | Attending: Internal Medicine | Admitting: Internal Medicine

## 2018-03-22 ENCOUNTER — Other Ambulatory Visit: Payer: Self-pay

## 2018-03-22 ENCOUNTER — Encounter (HOSPITAL_COMMUNITY): Payer: Self-pay | Admitting: Emergency Medicine

## 2018-03-22 DIAGNOSIS — E1010 Type 1 diabetes mellitus with ketoacidosis without coma: Secondary | ICD-10-CM | POA: Diagnosis not present

## 2018-03-22 DIAGNOSIS — N179 Acute kidney failure, unspecified: Secondary | ICD-10-CM

## 2018-03-22 DIAGNOSIS — R000 Tachycardia, unspecified: Secondary | ICD-10-CM

## 2018-03-22 DIAGNOSIS — D649 Anemia, unspecified: Secondary | ICD-10-CM

## 2018-03-22 DIAGNOSIS — E1043 Type 1 diabetes mellitus with diabetic autonomic (poly)neuropathy: Secondary | ICD-10-CM | POA: Diagnosis not present

## 2018-03-22 DIAGNOSIS — F419 Anxiety disorder, unspecified: Secondary | ICD-10-CM | POA: Diagnosis present

## 2018-03-22 DIAGNOSIS — K219 Gastro-esophageal reflux disease without esophagitis: Secondary | ICD-10-CM

## 2018-03-22 DIAGNOSIS — E860 Dehydration: Secondary | ICD-10-CM | POA: Diagnosis present

## 2018-03-22 DIAGNOSIS — F330 Major depressive disorder, recurrent, mild: Secondary | ICD-10-CM

## 2018-03-22 DIAGNOSIS — M797 Fibromyalgia: Secondary | ICD-10-CM | POA: Diagnosis present

## 2018-03-22 DIAGNOSIS — I10 Essential (primary) hypertension: Secondary | ICD-10-CM | POA: Diagnosis not present

## 2018-03-22 DIAGNOSIS — E1069 Type 1 diabetes mellitus with other specified complication: Secondary | ICD-10-CM | POA: Diagnosis present

## 2018-03-22 DIAGNOSIS — K3184 Gastroparesis: Secondary | ICD-10-CM | POA: Diagnosis not present

## 2018-03-22 DIAGNOSIS — E1159 Type 2 diabetes mellitus with other circulatory complications: Secondary | ICD-10-CM

## 2018-03-22 DIAGNOSIS — E109 Type 1 diabetes mellitus without complications: Secondary | ICD-10-CM | POA: Diagnosis present

## 2018-03-22 DIAGNOSIS — R112 Nausea with vomiting, unspecified: Secondary | ICD-10-CM

## 2018-03-22 DIAGNOSIS — F329 Major depressive disorder, single episode, unspecified: Secondary | ICD-10-CM | POA: Diagnosis present

## 2018-03-22 DIAGNOSIS — Z79899 Other long term (current) drug therapy: Secondary | ICD-10-CM

## 2018-03-22 DIAGNOSIS — I152 Hypertension secondary to endocrine disorders: Secondary | ICD-10-CM | POA: Diagnosis present

## 2018-03-22 LAB — BASIC METABOLIC PANEL
Anion gap: 19 — ABNORMAL HIGH (ref 5–15)
Anion gap: 12 (ref 5–15)
Anion gap: 10 (ref 5–15)
BUN: 28 mg/dL — ABNORMAL HIGH (ref 6–20)
BUN: 26 mg/dL — ABNORMAL HIGH (ref 6–20)
BUN: 25 mg/dL — ABNORMAL HIGH (ref 6–20)
CO2: 18 mmol/L — ABNORMAL LOW (ref 22–32)
CO2: 23 mmol/L (ref 22–32)
CO2: 26 mmol/L (ref 22–32)
Calcium: 8.4 mg/dL — ABNORMAL LOW (ref 8.9–10.3)
Calcium: 8.4 mg/dL — ABNORMAL LOW (ref 8.9–10.3)
Calcium: 8.3 mg/dL — ABNORMAL LOW (ref 8.9–10.3)
Chloride: 100 mmol/L (ref 98–111)
Chloride: 106 mmol/L (ref 98–111)
Chloride: 105 mmol/L (ref 98–111)
Creatinine, Ser: 0.94 mg/dL (ref 0.44–1.00)
Creatinine, Ser: 0.8 mg/dL (ref 0.44–1.00)
Creatinine, Ser: 0.61 mg/dL (ref 0.44–1.00)
GFR calc Af Amer: >60 mL/min (ref >60)
GFR calc Af Amer: >60 mL/min (ref >60)
GFR calc Af Amer: >60 mL/min (ref >60)
GFR calc non Af Amer: >60 mL/min (ref >60)
GFR calc non Af Amer: >60 mL/min (ref >60)
GFR calc non Af Amer: >60 mL/min (ref >60)
Glucose, Bld: 348 mg/dL — ABNORMAL HIGH (ref 70–99)
Glucose, Bld: 228 mg/dL — ABNORMAL HIGH (ref 70–99)
Glucose, Bld: 68 mg/dL — ABNORMAL LOW (ref 70–99)
Potassium: 3.9 mmol/L (ref 3.5–5.1)
Potassium: 3.7 mmol/L (ref 3.5–5.1)
Potassium: 3.8 mmol/L (ref 3.5–5.1)
Sodium: 137 mmol/L (ref 135–145)
Sodium: 141 mmol/L (ref 135–145)
Sodium: 141 mmol/L (ref 135–145)

## 2018-03-22 LAB — COMPREHENSIVE METABOLIC PANEL
ALT: 21 U/L (ref 0–44)
AST: 21 U/L (ref 15–41)
Albumin: 4.2 g/dL (ref 3.5–5.0)
Alkaline Phosphatase: 89 U/L (ref 38–126)
Anion gap: 23 — ABNORMAL HIGH (ref 5–15)
BUN: 34 mg/dL — ABNORMAL HIGH (ref 6–20)
CO2: 21 mmol/L — ABNORMAL LOW (ref 22–32)
Calcium: 9.1 mg/dL (ref 8.9–10.3)
Chloride: 93 mmol/L — ABNORMAL LOW (ref 98–111)
Creatinine, Ser: 1.05 mg/dL — ABNORMAL HIGH (ref 0.44–1.00)
GFR calc Af Amer: >60 mL/min (ref >60)
GFR calc non Af Amer: >60 mL/min (ref >60)
Glucose, Bld: 356 mg/dL — ABNORMAL HIGH (ref 70–99)
Potassium: 3.8 mmol/L (ref 3.5–5.1)
Sodium: 137 mmol/L (ref 135–145)
Total Bilirubin: 1.7 mg/dL — ABNORMAL HIGH (ref 0.3–1.2)
Total Protein: 7.9 g/dL (ref 6.5–8.1)

## 2018-03-22 LAB — GLUCOSE, CAPILLARY
Glucose-Capillary: 103 mg/dL — ABNORMAL HIGH (ref 70–99)
Glucose-Capillary: 104 mg/dL — ABNORMAL HIGH (ref 70–99)
Glucose-Capillary: 172 mg/dL — ABNORMAL HIGH (ref 70–99)
Glucose-Capillary: 274 mg/dL — ABNORMAL HIGH (ref 70–99)
Glucose-Capillary: 282 mg/dL — ABNORMAL HIGH (ref 70–99)
Glucose-Capillary: 297 mg/dL — ABNORMAL HIGH (ref 70–99)
Glucose-Capillary: 311 mg/dL — ABNORMAL HIGH (ref 70–99)
Glucose-Capillary: 45 mg/dL — ABNORMAL LOW (ref 70–99)
Glucose-Capillary: 52 mg/dL — ABNORMAL LOW (ref 70–99)
Glucose-Capillary: 58 mg/dL — ABNORMAL LOW (ref 70–99)
Glucose-Capillary: 62 mg/dL — ABNORMAL LOW (ref 70–99)
Glucose-Capillary: 79 mg/dL (ref 70–99)
Glucose-Capillary: 90 mg/dL (ref 70–99)
Glucose-Capillary: 93 mg/dL (ref 70–99)

## 2018-03-22 LAB — URINALYSIS, ROUTINE W REFLEX MICROSCOPIC
Bacteria, UA: NONE SEEN
Bilirubin Urine: NEGATIVE
Glucose, UA: >=500 mg/dL — AB
Ketones, ur: 80 mg/dL — AB
Leukocytes, UA: NEGATIVE
Nitrite: NEGATIVE
Protein, ur: >=300 mg/dL — AB
Specific Gravity, Urine: 1.023 (ref 1.005–1.030)
pH: 5 (ref 5.0–8.0)

## 2018-03-22 LAB — CBC
HCT: 35.3 % — ABNORMAL LOW (ref 36.0–46.0)
Hemoglobin: 11.4 g/dL — ABNORMAL LOW (ref 12.0–15.0)
MCH: 28.8 pg (ref 26.0–34.0)
MCHC: 32.3 g/dL (ref 30.0–36.0)
MCV: 89.1 fL (ref 80.0–100.0)
Platelets: 252 10*3/uL (ref 150–400)
RBC: 3.96 MIL/uL (ref 3.87–5.11)
RDW: 12.7 % (ref 11.5–15.5)
WBC: 9 10*3/uL (ref 4.0–10.5)
nRBC: 0 % (ref 0.0–0.2)

## 2018-03-22 LAB — LIPASE, BLOOD: Lipase: 19 U/L (ref 11–51)

## 2018-03-22 LAB — PHOSPHORUS: Phosphorus: 3.1 mg/dL (ref 2.5–4.6)

## 2018-03-22 LAB — MRSA PCR SCREENING: MRSA by PCR: NEGATIVE

## 2018-03-22 LAB — I-STAT BETA HCG BLOOD, ED (MC, WL, AP ONLY): I-stat hCG, quantitative: <5 m[IU]/mL (ref <5)

## 2018-03-22 LAB — CBG MONITORING, ED: Glucose-Capillary: 332 mg/dL — ABNORMAL HIGH (ref 70–99)

## 2018-03-22 LAB — MAGNESIUM: Magnesium: 2 mg/dL (ref 1.7–2.4)

## 2018-03-22 LAB — BETA-HYDROXYBUTYRIC ACID: Beta-Hydroxybutyric Acid: 6 mmol/L — ABNORMAL HIGH (ref 0.05–0.27)

## 2018-03-22 MED ORDER — POTASSIUM CHLORIDE 10 MEQ/100ML IV SOLN
10.0000 meq | INTRAVENOUS | Status: AC
  Administered 2018-03-22 (×2): 10 meq via INTRAVENOUS
  Filled 2018-03-22 (×2): qty 100

## 2018-03-22 MED ORDER — INSULIN ASPART 100 UNIT/ML ~~LOC~~ SOLN
0.0000 [IU] | Freq: Three times a day (TID) | SUBCUTANEOUS | Status: DC
  Administered 2018-03-23: 2 [IU] via SUBCUTANEOUS
  Administered 2018-03-23 – 2018-03-24 (×3): 3 [IU] via SUBCUTANEOUS
  Administered 2018-03-24: 2 [IU] via SUBCUTANEOUS
  Administered 2018-03-25 (×2): 5 [IU] via SUBCUTANEOUS

## 2018-03-22 MED ORDER — SODIUM CHLORIDE 0.9 % IV SOLN
INTRAVENOUS | Status: DC
  Administered 2018-03-22 – 2018-03-25 (×5): via INTRAVENOUS

## 2018-03-22 MED ORDER — FERROUS SULFATE 325 (65 FE) MG PO TABS
325.0000 mg | ORAL_TABLET | Freq: Two times a day (BID) | ORAL | Status: DC
  Administered 2018-03-22 – 2018-03-24 (×3): 325 mg via ORAL
  Filled 2018-03-22 (×3): qty 1

## 2018-03-22 MED ORDER — ONDANSETRON HCL 4 MG/2ML IJ SOLN
4.0000 mg | Freq: Once | INTRAMUSCULAR | Status: AC
  Administered 2018-03-22: 4 mg via INTRAVENOUS
  Filled 2018-03-22: qty 2

## 2018-03-22 MED ORDER — METOCLOPRAMIDE HCL 5 MG/ML IJ SOLN
10.0000 mg | Freq: Four times a day (QID) | INTRAMUSCULAR | Status: DC
  Administered 2018-03-22 – 2018-03-23 (×4): 10 mg via INTRAVENOUS
  Filled 2018-03-22 (×4): qty 2

## 2018-03-22 MED ORDER — PROMETHAZINE HCL 25 MG/ML IJ SOLN
12.5000 mg | Freq: Four times a day (QID) | INTRAMUSCULAR | Status: DC | PRN
  Administered 2018-03-23 – 2018-03-25 (×7): 12.5 mg via INTRAVENOUS
  Filled 2018-03-22 (×7): qty 1

## 2018-03-22 MED ORDER — FENTANYL CITRATE (PF) 100 MCG/2ML IJ SOLN
25.0000 ug | INTRAMUSCULAR | Status: DC | PRN
  Administered 2018-03-22 – 2018-03-23 (×4): 25 ug via INTRAVENOUS
  Filled 2018-03-22 (×4): qty 2

## 2018-03-22 MED ORDER — DEXTROSE 50 % IV SOLN
INTRAVENOUS | Status: AC
  Administered 2018-03-22: 50 mL
  Filled 2018-03-22: qty 50

## 2018-03-22 MED ORDER — HEPARIN SODIUM (PORCINE) 5000 UNIT/ML IJ SOLN
5000.0000 [IU] | Freq: Three times a day (TID) | INTRAMUSCULAR | Status: DC
  Administered 2018-03-22 – 2018-03-25 (×9): 5000 [IU] via SUBCUTANEOUS
  Filled 2018-03-22 (×9): qty 1

## 2018-03-22 MED ORDER — METOCLOPRAMIDE HCL 5 MG/ML IJ SOLN: 10.0000 mg | Freq: Once | INTRAMUSCULAR | Status: DC

## 2018-03-22 MED ORDER — SODIUM CHLORIDE 0.9 % IV SOLN
1000.0000 mL | INTRAVENOUS | Status: DC
  Administered 2018-03-22: 1000 mL via INTRAVENOUS

## 2018-03-22 MED ORDER — INSULIN GLARGINE 100 UNIT/ML ~~LOC~~ SOLN
5.0000 [IU] | Freq: Every day | SUBCUTANEOUS | Status: DC
  Administered 2018-03-22 – 2018-03-24 (×3): 5 [IU] via SUBCUTANEOUS
  Filled 2018-03-22 (×4): qty 0.05

## 2018-03-22 MED ORDER — DEXTROSE 50 % IV SOLN
INTRAVENOUS | Status: AC
  Administered 2018-03-22: 22 mL
  Filled 2018-03-22: qty 50

## 2018-03-22 MED ORDER — SODIUM CHLORIDE 0.9 % IV BOLUS (SEPSIS)
1000.0000 mL | Freq: Once | INTRAVENOUS | Status: AC
  Administered 2018-03-22: 1000 mL via INTRAVENOUS

## 2018-03-22 MED ORDER — DEXTROSE 50 % IV SOLN
INTRAVENOUS | Status: AC
  Filled 2018-03-22: qty 50

## 2018-03-22 MED ORDER — PANTOPRAZOLE SODIUM 40 MG IV SOLR
40.0000 mg | Freq: Two times a day (BID) | INTRAVENOUS | Status: DC
  Administered 2018-03-22 (×2): 40 mg via INTRAVENOUS
  Filled 2018-03-22 (×2): qty 40

## 2018-03-22 MED ORDER — FENTANYL CITRATE (PF) 100 MCG/2ML IJ SOLN
50.0000 ug | Freq: Once | INTRAMUSCULAR | Status: AC
  Administered 2018-03-22: 50 ug via INTRAVENOUS
  Filled 2018-03-22: qty 2

## 2018-03-22 MED ORDER — SODIUM CHLORIDE 0.9 % IV SOLN: INTRAVENOUS | Status: AC

## 2018-03-22 MED ORDER — SODIUM CHLORIDE 0.9 % IV SOLN
INTRAVENOUS | Status: DC
  Administered 2018-03-22: 15:00:00 via INTRAVENOUS

## 2018-03-22 MED ORDER — DIPHENHYDRAMINE HCL 50 MG/ML IJ SOLN: 12.5000 mg | Freq: Four times a day (QID) | INTRAMUSCULAR | Status: DC | PRN

## 2018-03-22 MED ORDER — DEXTROSE 50 % IV SOLN
15.0000 mL | Freq: Once | INTRAVENOUS | Status: AC
  Administered 2018-03-22: 15 mL via INTRAVENOUS

## 2018-03-22 MED ORDER — DEXTROSE-NACL 5-0.45 % IV SOLN
INTRAVENOUS | Status: DC
  Administered 2018-03-22: 1 mL via INTRAVENOUS

## 2018-03-22 MED ORDER — LORAZEPAM 2 MG/ML IJ SOLN
1.0000 mg | Freq: Once | INTRAMUSCULAR | Status: AC
  Administered 2018-03-22: 1 mg via INTRAVENOUS
  Filled 2018-03-22: qty 1

## 2018-03-22 MED ORDER — INSULIN REGULAR(HUMAN) IN NACL 100-0.9 UT/100ML-% IV SOLN
INTRAVENOUS | Status: DC
  Administered 2018-03-22: 2.7 [IU]/h via INTRAVENOUS
  Filled 2018-03-22: qty 100

## 2018-03-22 MED ORDER — HYOSCYAMINE SULFATE 0.125 MG SL SUBL
0.1250 mg | SUBLINGUAL_TABLET | Freq: Four times a day (QID) | SUBLINGUAL | Status: DC | PRN
  Administered 2018-03-25: 0.125 mg via SUBLINGUAL
  Filled 2018-03-22 (×4): qty 1

## 2018-03-22 MED ORDER — ONDANSETRON HCL 4 MG/2ML IJ SOLN
4.0000 mg | Freq: Four times a day (QID) | INTRAMUSCULAR | Status: DC | PRN
  Administered 2018-03-23 (×2): 4 mg via INTRAVENOUS
  Filled 2018-03-22 (×2): qty 2

## 2018-03-22 MED ORDER — SODIUM CHLORIDE 0.9 % IV SOLN
INTRAVENOUS | Status: DC
  Administered 2018-03-22: 11:00:00 via INTRAVENOUS

## 2018-03-22 MED ORDER — DEXTROSE-NACL 5-0.45 % IV SOLN: INTRAVENOUS | Status: DC

## 2018-03-22 MED ORDER — HYDRALAZINE HCL 20 MG/ML IJ SOLN
10.0000 mg | Freq: Four times a day (QID) | INTRAMUSCULAR | Status: DC | PRN
  Administered 2018-03-23 (×3): 10 mg via INTRAVENOUS
  Filled 2018-03-22 (×3): qty 1

## 2018-03-22 MED ORDER — LORAZEPAM 1 MG PO TABS
1.0000 mg | ORAL_TABLET | Freq: Once | ORAL | Status: AC
  Administered 2018-03-22: 1 mg via ORAL
  Filled 2018-03-22: qty 1

## 2018-03-22 NOTE — ED Notes (Signed)
Pt in room, crying hysterically. Pt stating that she is in a lot of pain, and that she needs water. Pt informed that due to her emesis, we are going to hold off on fluids. Pt informed of IV hydration occurring. Pt still teary. Comfort provided by this RN and pt's husband. PA informed

## 2018-03-22 NOTE — ED Notes (Signed)
Pt asleep with husband and child at bedside

## 2018-03-22 NOTE — ED Notes (Signed)
Pt is aware a urine sample is needed but states she cannot give sample right now. Pt vomiting currently. Christeen Douglas, RN aware.

## 2018-03-22 NOTE — ED Notes (Signed)
ED TO INPATIENT HANDOFF REPORT  Name/Age/Gender [name] 27 y.o. female  Code Status Code Status History    Date Active Date Inactive Code Status Order ID Comments User Context   02/18/2018 0230 02/20/2018 1937 Full Code [id number]  Therisa Doyne, MD ED   02/18/2018 0035 02/18/2018 0230 Full Code [id number]  Therisa Doyne, MD ED   12/19/2017 0305 12/25/2017 1830 Full Code [id number]  Hillary Bow, DO ED   07/27/2017 0340 08/01/2017 1716 Full Code [id number]  Eduard Clos, MD ED   07/10/2017 0441 07/13/2017 1908 Full Code [id number]  Briscoe Deutscher, MD ED   06/23/2017 2017 06/27/2017 2144 Full Code [id number]  Clydie Braun, MD ED   04/10/2017 1406 04/16/2017 1816 Full Code [id number]  Haydee Monica, MD Inpatient   03/23/2017 1316 03/24/2017 1854 Full Code [id number]  Haydee Monica, MD Inpatient   03/17/2017 0050 03/21/2017 2352 Full Code [id number]  Eduard Clos, MD ED   02/26/2017 2259 03/14/2017 2221 Full Code [id number]  Jonah Blue, MD Inpatient   02/26/2017 2012 02/26/2017 2259 Full Code [id number]  Reyes Ivan, MD ED   01/26/2015 0513 01/29/2015 1812 Full Code [id number]  Roe Coombs, CNM Inpatient   01/24/2015 1021 01/26/2015 0513 Full Code [id number]  Barkley Bruns, RN Inpatient   01/16/2015 1930 01/24/2015 1021 Full Code [id number]  Marlis Edelson, CNM Inpatient   06/11/2013 0049 06/12/2013 1340 Full Code [id number]  Ron Parker, MD ED   04/28/2013 0319 04/29/2013 1814 Full Code [id number]  Eduard Clos, MD Inpatient   09/01/2012 0749 09/02/2012 1159 Full Code [id number]  Tarry Kos, MD Inpatient   03/15/2012 2346 03/16/2012 1646 Full Code [id number]  Imogene Burn, RN Inpatient   02/28/2012 1657 03/01/2012 1921 Full Code [id number]  Brantley Persons, RN Inpatient   11/23/2011 0336 12/06/2011 2004 Full Code [id number]  Lorraine Lax, RN Inpatient   09/18/2011 1007 09/20/2011 2050 Full Code [id number]  Kathryn Chaco, NP Inpatient      Home/SNF/Other Home  Chief Complaint Nausea; Abdominal Pain; Back Pain  Level of Care/Admitting Diagnosis ED Disposition    ED Disposition Condition Comment   Admit  Hospital Area: [facility] [[id number]]  Level of Care: Stepdown [14]  Admit to SDU based on following criteria: Hemodynamic compromise or significant risk of instability:  Patient requiring short term acute titration and management of vasoactive drips, and invasive monitoring (i.e., CVP and Arterial line).  Admit to SDU based on following criteria: Severe physiological/psychological symptoms:  Any diagnosis requiring assessment & intervention at least every 4 hours on an ongoing basis to obtain desired patient outcomes including stability and rehabilitation  Diagnosis: DKA, type 1, not at goal [facility]) [382505]  Admitting Physician: Marguerita Merles LATIF [[id number]]  Attending Physician: Marguerita Merles LATIF [[id number]]  PT Class (Do Not Modify): Observation [104]  PT Acc Code (Do Not Modify): Observation [10022]       Medical History Past Medical History:  Diagnosis Date  . Allergy   . Anemia   . Anxiety   . Blood transfusion without reported diagnosis    Dec 2018  . Cataract    right eye  . Depression   . Diabetes type 1, uncontrolled (HCC) 11/14/2011   Since age 1  . Fibromyalgia   . Gastroparesis   . GERD (gastroesophageal reflux disease)   . Hypertension   . Infection    UTI April 2016    Allergies Allergies  Allergen  Reactions  . Other Anaphylaxis    Reaction to [place] nuts   . Lactose Intolerance (Gi) Diarrhea    IV Location/Drains/Wounds Patient Lines/Drains/Airways Status   Active Line/Drains/Airways    Name:   Placement date:   Placement time:   Site:   Days:   Peripheral IV 03/22/18 Left Antecubital   03/22/18    1610    Antecubital   less than 1   Peripheral IV 03/22/18 Right Antecubital   03/22/18    1116    Antecubital   less than 1          Labs/Imaging Results  for orders placed or performed during the hospital encounter of 03/22/18 (from the past 48 hour(s))  Lipase, blood     Status: None   Collection Time: 03/22/18  6:21 AM  Result Value Ref Range   Lipase 19 11 - 51 U/L    Comment: Performed at [facility], [address] [place], [place] [address]  Comprehensive metabolic panel     Status: Abnormal   Collection Time: 03/22/18  6:21 AM  Result Value Ref Range   Sodium 137 135 - 145 mmol/L   Potassium 3.8 3.5 - 5.1 mmol/L   Chloride 93 (L) 98 - 111 mmol/L   CO2 21 (L) 22 - 32 mmol/L   Glucose, Bld 356 (H) 70 - 99 mg/dL   BUN 34 (H) 6 - 20 mg/dL   Creatinine, Ser 4.09 (H) 0.44 - 1.00 mg/dL   Calcium 9.1 8.9 - 81.1 mg/dL   Total Protein 7.9 6.5 - 8.1 g/dL   Albumin 4.2 3.5 - 5.0 g/dL   AST 21 15 - 41 U/L   ALT 21 0 - 44 U/L   Alkaline Phosphatase 89 38 - 126 U/L   Total Bilirubin 1.7 (H) 0.3 - 1.2 mg/dL   GFR calc non Af Amer >60 >60 mL/min   GFR calc Af Amer >60 >60 mL/min   Anion gap 23 (H) 5 - 15    Comment: Performed at [facility], [address], [place], [place] [address]  CBC     Status: Abnormal   Collection Time: 03/22/18  6:21 AM  Result Value Ref Range   WBC 9.0 4.0 - 10.5 K/uL   RBC 3.96 3.87 - 5.11 MIL/uL   Hemoglobin 11.4 (L) 12.0 - 15.0 g/dL   HCT 29.5 (L) 62.1 - 30.8 %   MCV 89.1 80.0 - 100.0 fL   MCH 28.8 26.0 - 34.0 pg   MCHC 32.3 30.0 - 36.0 g/dL   RDW 65.7 84.6 - 96.2 %   Platelets 252 150 - 400 K/uL   nRBC 0.0 0.0 - 0.2 %    Comment: Performed at [facility], [address] [place], [place] [address]  I-Stat beta hCG blood, ED     Status: None   Collection Time: 03/22/18  6:26 AM  Result Value Ref Range   I-stat hCG, quantitative <5.0 <5 mIU/mL   Comment 3            Comment:   GEST. AGE      CONC.  (mIU/mL)   <=1 WEEK        5 - 50     2 WEEKS       50 - 500     3 WEEKS       100 - 10,000     4 WEEKS     1,000 - 30,000  FEMALE AND  NON-PREGNANT FEMALE:     LESS THAN 5 mIU/mL   Urinalysis, Routine w reflex microscopic     Status: Abnormal   Collection Time: 03/22/18  7:04 AM  Result Value Ref Range   Color, Urine YELLOW YELLOW   APPearance CLEAR CLEAR   Specific Gravity, Urine 1.023 1.005 - 1.030   pH 5.0 5.0 - 8.0   Glucose, UA >=500 (A) NEGATIVE mg/dL   Hgb urine dipstick SMALL (A) NEGATIVE   Bilirubin Urine NEGATIVE NEGATIVE   Ketones, ur 80 (A) NEGATIVE mg/dL   Protein, ur >=767 (A) NEGATIVE mg/dL   Nitrite NEGATIVE NEGATIVE   Leukocytes, UA NEGATIVE NEGATIVE   RBC / HPF 11-20 0 - 5 RBC/hpf   WBC, UA 0-5 0 - 5 WBC/hpf   Bacteria, UA NONE SEEN NONE SEEN   Mucus PRESENT    Hyaline Casts, UA PRESENT     Comment: Performed at [facility], [address], [place], [place] [address]  POC CBG, ED     Status: Abnormal   Collection Time: 03/22/18 10:34 AM  Result Value Ref Range   Glucose-Capillary 332 (H) 70 - 99 mg/dL   No results found.  Pending Labs [employer] (From admission, onward)    Start     Ordered   Signed and Held  HIV antibody (Routine Testing)  Once,   R     Signed and Held   Signed and Interior and spatial designer  STAT Now then every 4 hours ,   STAT     Signed and Held   Signed and Held  Beta-hydroxybutyric acid  Once,   R     Signed and Held   Signed and Held  Magnesium  Add-on,   R     Signed and Held   Signed and Held  Phosphorus  Add-on,   R     Signed and Held   Signed and Held  Culture, blood (routine x 2)  BLOOD CULTURE X 2,   R     Signed and Held   Signed and Held  Urine culture  Once,   R     Signed and Held   Signed and Held  Urine rapid drug screen (hosp performed)not at [employer]  Once,   R     Signed and Held          Vitals/Pain Today's Vitals   03/22/18 1000 03/22/18 1030 03/22/18 1100 03/22/18 1135  BP: 133/87 117/78 (!) 173/111 (!) 170/111  Pulse: 100 96 (!) 122 (!) 115  Resp: 17 17 16 19   Temp:      TempSrc:      SpO2: 100% 100% 99%  100%  Weight:      Height:      PainSc:        Isolation Precautions No active isolations  Medications Medications  sodium chloride 0.9 % bolus 1,000 mL (0 mLs Intravenous Stopped 03/22/18 0824)    Followed by  sodium chloride 0.9 % bolus 1,000 mL (0 mLs Intravenous Stopped 03/22/18 0923)    Followed by  0.9 %  sodium chloride infusion (0 mLs Intravenous Stopped 03/22/18 1109)  metoCLOPramide (REGLAN) injection 10 mg (0 mg Intravenous Hold 03/22/18 0725)  diphenhydrAMINE (BENADRYL) injection 12.5 mg (has no administration in time range)  0.9 %  sodium chloride infusion ( Intravenous Not Given 03/22/18 1057)  0.9 %  sodium chloride infusion ( Intravenous New Bag/Given 03/22/18 1110)  dextrose 5 %-0.45 %  sodium chloride infusion ( Intravenous Hold 03/22/18 1058)  insulin regular, human (MYXREDLIN) 100 units/ 100 mL infusion (2.7 Units/hr Intravenous New Bag/Given 03/22/18 1122)  potassium chloride 10 mEq in 100 mL IVPB (10 mEq Intravenous New Bag/Given 03/22/18 1110)  ondansetron (ZOFRAN) injection 4 mg (4 mg Intravenous Given 03/22/18 0724)  LORazepam (ATIVAN) injection 1 mg (1 mg Intravenous Given 03/22/18 0746)    Mobility walks

## 2018-03-22 NOTE — ED Triage Notes (Signed)
Patient is complaining of upper abdomen and upper back pain. Patient states her pain medication is not working.

## 2018-03-22 NOTE — ED Notes (Signed)
Attempted report, ICU still assigning RNs

## 2018-03-22 NOTE — ED Notes (Signed)
Pt is now sleeping comfortably, respirations unlabored. Pt no longer crying.

## 2018-03-22 NOTE — H&P (Signed)
History and Physical    [name] DIY:[id number] DOB: [birth year] DOA: 03/22/2018  PCP: Patient, No Pcp Per   Patient coming from: Home  Chief Complaint: Nausea and Vomiting Last night.  HPI: [name] is a 27 y.o. female with medical history significant of dependent diabetes mellitus type 1, anxiety and depression, seasonal allergies, chronic anemia, history of cataract in the right eye, GERD and gastroparesis, fibromyalgia, hypertension, and other comorbidities who presents with a chief complaint of feeling bad with nausea, vomiting abdominal pain and back pain.  Patient states that she started feeling bad  yesterday and then last night because nauseous and started vomiting. No Sick Contacts. States whenever she has her period  (recently started) it worsens her gastroparesis and because of it she started to vomit. Took some insulin but still not able to eat anything Denise blood in vomitus. Also states that her back and Abdomen in the upper quadrants are hurting in a 10/10 Severity.  ED Course: In the ED she was given 3 boluses of normal saline and placed on maintenance IV fluid as well as an insulin drip.  Basic blood work was done as well as a VBG and urinalysis.  She is given IV Zofran and IV metoclopramide along with 1 mg of lorazepam.  Review of Systems: As per HPI otherwise 10 point review of systems negative.   Past Medical History:  Diagnosis Date  . Allergy   . Anemia   . Anxiety   . Blood transfusion without reported diagnosis    Dec 2018  . Cataract    right eye  . Depression   . Diabetes type 1, uncontrolled ([place]) 11/14/2011   Since age 38  . Fibromyalgia   . Gastroparesis   . GERD (gastroesophageal reflux disease)   . Hypertension   . Infection    UTI April 2016   Past Surgical History:  Procedure Laterality Date  . ANKLE SURGERY    . CHOLECYSTECTOMY  11/15/2011   Procedure: LAPAROSCOPIC CHOLECYSTECTOMY WITH INTRAOPERATIVE CHOLANGIOGRAM;  Surgeon: [name], MD;  Location: [facility];  Service: General;  Laterality: N/A;  . COLONOSCOPY    . COLONOSCOPY WITH PROPOFOL N/A 06/27/2017   Procedure: COLONOSCOPY WITH PROPOFOL;  Surgeon: Milus Banister, MD;  Location: [facility];  Service: Endoscopy;  Laterality: N/A;  . ESOPHAGOGASTRODUODENOSCOPY  12/03/2011   Procedure: ESOPHAGOGASTRODUODENOSCOPY (EGD);  Surgeon: Beryle Beams, MD;  Location: [facility];  Service: Endoscopy;  Laterality: N/A;  . FLEXIBLE SIGMOIDOSCOPY N/A 03/10/2017   Procedure: FLEXIBLE SIGMOIDOSCOPY;  Surgeon: Carol Ada, MD;  Location: [facility];  Service: Endoscopy;  Laterality: N/A;  . INCISION AND DRAINAGE PERIRECTAL ABSCESS N/A 03/01/2017   Procedure: IRRIGATION AND DEBRIDEMENT PERIRECTAL ABSCESS;  Surgeon: Alphonsa Overall, MD;  Location: [facility];  Service: General;  Laterality: N/A;  . IRRIGATION AND DEBRIDEMENT BUTTOCKS N/A 03/23/2017   Procedure: IRRIGATION AND DEBRIDEMENT BUTTOCKS, SETON PLACEMENT;  Surgeon: [name], MD;  Location: [facility];  Service: General;  Laterality: N/A;  . LAPAROSCOPY  11/23/2011   Procedure: LAPAROSCOPY DIAGNOSTIC;  Surgeon: Edward Jolly, MD;  Location: [facility];  Service: General;  Laterality: N/A;  . SIGMOIDOSCOPY    . UPPER GASTROINTESTINAL ENDOSCOPY    . WISDOM TOOTH EXTRACTION     SOCIAL HISTORY  reports that she has never smoked. She has never used smokeless tobacco. She reports that she does not drink alcohol or use drugs.  Allergies  Allergen Reactions  . Other Anaphylaxis  Reaction to [place] nuts   . Lactose Intolerance (Gi) Diarrhea   Family History  Problem Relation Age of Onset  . Diabetes Mother   . Hypertension Father   . Colon cancer Paternal Grandmother        pt thinks PGM was dx in her 26's  . Esophageal cancer Neg Hx   . Liver cancer Neg Hx   . Pancreatic cancer Neg Hx   . Stomach cancer Neg Hx   . Rectal cancer Neg Hx    Prior to Admission medications   Medication Sig Start Date End Date  Taking? Authorizing Provider  acetaminophen (TYLENOL) 500 MG tablet Take 1,000 mg by mouth daily as needed for moderate pain.    Yes [provider]  ferrous sulfate 325 (65 FE) MG tablet Take 1 tablet (325 mg total) by mouth 2 (two) times daily with a meal. Patient taking differently: Take 325 mg by mouth daily.  05/13/17  Yes Henson, Vickie L, NP-C  hyoscyamine (LEVSIN SL) 0.125 MG SL tablet Place 1 tablet (0.125 mg total) under the tongue every 6 (six) hours as needed for cramping (abdominal pain). 02/20/18  Yes Adhikari, Tamsen Meek, MD  insulin lispro (HUMALOG) 100 UNIT/ML injection Inject 0-0.09 mLs (0-9 Units total) into the skin 3 (three) times daily with meals. 02/20/18  Yes Adhikari, Tamsen Meek, MD  LANTUS 100 UNIT/ML injection Inject 0.12 mLs (12 Units total) into the skin daily. 02/20/18  Yes Shelly Coss, MD  metoCLOPramide (REGLAN) 10 MG tablet Take 1 tablet (10 mg total) by mouth every 6 (six) hours as needed for nausea or vomiting. 02/20/18  Yes Shelly Coss, MD  pantoprazole (PROTONIX) 40 MG tablet Take 1 tablet (40 mg total) by mouth 2 (two) times daily. 02/20/18  Yes Shelly Coss, MD  citalopram (CELEXA) 20 MG tablet Take 1 tablet (20 mg total) by mouth daily. Patient not taking: Reported on 03/22/2018 07/13/17   Roxan Hockey, MD  mirtazapine (REMERON) 15 MG tablet Take 1 tablet (15 mg total) by mouth at bedtime. Patient not taking: Reported on 03/22/2018 01/12/18   Yetta Flock, MD  promethazine (PHENERGAN) 12.5 MG tablet TAKE 1 TABLET BY MOUTH  EVERY 8 HOURS AS NEEDED FOR NAUSEA AND VOMITING Patient not taking: Reported on 03/22/2018 03/11/18 03/22/18  Yetta Flock, MD   Physical Exam: Vitals:   03/22/18 1000 03/22/18 1030 03/22/18 1100 03/22/18 1135  BP: 133/87 117/78 (!) 173/111 (!) 170/111  Pulse: 100 96 (!) 122 (!) 115  Resp: 17 17 16 19   Temp:      TempSrc:      SpO2: 100% 100% 99% 100%  Weight:      Height:       Constitutional: Thin  female in moderate distress actively vomiting and appears uncomfortable Eyes: Lids and conjunctivae normal, sclerae anicteric  ENMT: External Ears, Nose appear normal. Grossly normal hearing. Mucous membranes are dry  Neck: Appears normal, supple, no cervical masses, normal ROM, no appreciable thyromegaly; no JVD Respiratory: Diminished to auscultation bilaterally, no wheezing, rales, rhonchi or crackles. Normal respiratory effort and patient is not tachypenic. No accessory muscle use.  Cardiovascular: Tachycardic Rate but regular rhythm, no murmurs / rubs / gallops. S1 and S2 auscultated.   Abdomen: Soft, Tender to palpate, non-distended. No masses palpated. No appreciable hepatosplenomegaly. Bowel sounds positive x4.  GU: Deferred. Musculoskeletal: No clubbing / cyanosis of digits/nails. No joint deformity upper and lower extremities.  Skin: No rashes, lesions, ulcers on a limited skin evaluation. No induration;  Warm and dry.  Neurologic: CN 2-12 grossly intact with no focal deficits.  Romberg sign and cerebellar reflexes not assessed.  Psychiatric: Normal judgment and insight. Alert and oriented x 3. Anxious mood and appropriate affect.   Labs on Admission: I have personally reviewed following labs and imaging studies  CBC: Recent Labs  Lab 03/22/18 0621  WBC 9.0  HGB 11.4*  HCT 35.3*  MCV 89.1  PLT 161   Basic Metabolic Panel: Recent Labs  Lab 03/22/18 0621  NA 137  K 3.8  CL 93*  CO2 21*  GLUCOSE 356*  BUN 34*  CREATININE 1.05*  CALCIUM 9.1   GFR: Estimated Creatinine Clearance: 71.4 mL/min (A) (by C-G formula based on SCr of 1.05 mg/dL (H)). Liver Function Tests: Recent Labs  Lab 03/22/18 0621  AST 21  ALT 21  ALKPHOS 89  BILITOT 1.7*  PROT 7.9  ALBUMIN 4.2   Recent Labs  Lab 03/22/18 0621  LIPASE 19   No results for input(s): AMMONIA in the last 168 hours. Coagulation Profile: No results for input(s): INR, PROTIME in the last 168 hours. Cardiac  Enzymes: No results for input(s): CKTOTAL, CKMB, CKMBINDEX, TROPONINI in the last 168 hours. BNP (last 3 results) No results for input(s): PROBNP in the last 8760 hours. HbA1C: No results for input(s): HGBA1C in the last 72 hours. CBG: Recent Labs  Lab 03/22/18 1034  GLUCAP 332*   Lipid Profile: No results for input(s): CHOL, HDL, LDLCALC, TRIG, CHOLHDL, LDLDIRECT in the last 72 hours. Thyroid Function Tests: No results for input(s): TSH, T4TOTAL, FREET4, T3FREE, THYROIDAB in the last 72 hours. Anemia Panel: No results for input(s): VITAMINB12, FOLATE, FERRITIN, TIBC, IRON, RETICCTPCT in the last 72 hours. Urine analysis:    Component Value Date/Time   COLORURINE YELLOW 03/22/2018 0704   APPEARANCEUR CLEAR 03/22/2018 0704   LABSPEC 1.023 03/22/2018 0704   PHURINE 5.0 03/22/2018 0704   GLUCOSEU >=500 (A) 03/22/2018 0704   HGBUR SMALL (A) 03/22/2018 0704   BILIRUBINUR NEGATIVE 03/22/2018 0704   BILIRUBINUR negative 01/02/2015 1717   KETONESUR 80 (A) 03/22/2018 0704   PROTEINUR >=300 (A) 03/22/2018 0704   UROBILINOGEN 0.2 01/13/2015 0223   NITRITE NEGATIVE 03/22/2018 0704   LEUKOCYTESUR NEGATIVE 03/22/2018 0704   Sepsis Labs: !!!!!!!!!!!!!!!!!!!!!!!!!!!!!!!!!!!!!!!!!!!! @LABRCNTIP (procalcitonin:4,lacticidven:4) )No results found for this or any previous visit (from the past 240 hour(s)).   Radiological Exams on Admission: No results found.  EKG: Not Done on in the ED on Admission so will order one now.   Assessment/Plan Active Problems:   Hypertension associated with diabetes ([place])   Gastroparesis   Sinus tachycardia   Diabetes mellitus type 1 ([place])   DKA, type 1 ([facility])   MDD (major depressive disorder), recurrent episode, mild ([facility])   Essential hypertension   Intractable nausea and vomiting   Normocytic normochromic anemia   GERD (gastroesophageal reflux disease)   DKA, type 1, not at goal [facility])   AKI (acute kidney injury) ([place])   Hyperbilirubinemia  DKA in  the Setting of Uncontrolled Diabetes Mellitus Type 1 -In the setting of Gastroparesis; Has been diabetic since age 57 -Patient's glucose on the UA was greater than 500 and he did have 80 ketones -Check Beta hydroxybutyrate,  -CO2 was 21 patient and anion gap was 23 -Initiated DKA protocol and was given 2.25 L normal saline boluses and placed on normal saline rate 100 and mils per hour with transition to D5 half-normal saline when blood sugars consistently less than 250 -Continue with insulin  glucose stabilizer drip and transition when gap is closed and CO2 is greater than 202 -For now we will continue n.p.o. except meds and sips as patient is still Nauseous and Vomiting  -We will transition to long-acting insulin when it is appropriate to do so -Last HbA1c was 12.0 in November -Diabetes education coronary for further evaluation recommendations -Check Blood Cx, Urine Cx  -Continue monitor CBGs per protocol -Continue monitor BMP is every 4 -CBGs have been ranging from 332-356 -Check UDS -We will place on a carb modified diet once appropriate   High Anion Gap Metabolic Acidosis -As above -Patient's CO2 was 21 and anion gap was 23 -Continue with IV fluid hydration as above -To monitor BMPs every 4 -Check beta hydroxybutyrate    Hyperbilirubinemia -Clear etiology was elevated at 1.7  -Continue monitor and trend and repeat CMP in the a.m.   Diabetic Gastroparesis -Sees Dr. Havery Moros in Gastroenterology -Will place on IV Pantoprazole 40 mg po BID, IV Metoclopramide 10 mg q6h -C/w Antiemetics with IV Zofran 4 mg q6hprn and IV Phenergan 12.5 mg q6hprn for Refractory Nausea and Vomtiing  -C/w Hyoscyamine 0.125 mg SL q6hprn  Normocytic Anemia -Patient's Hb/Hct on Admission was 11.4/35.3 -Check Anemia Panel in AM -C/w Ferrous Sulfate 325 mg po BID -Continue to Monitor for S/Sx of Bleeding -Repeat CBC in AM  AKI -In the setting of Dehydration from Nasusea and Vomiting and  DKA -Patient's BUN/Cr was elevated at 34/1.05 and was elevated from prior 14/0.50 -C/w IVF Hydration -Avoid Nephrotoxic Medications if possible -Repeat CMP in AM   Hx of Prolonged QT -Check EKG as one was not done in ED -C/w SDU Telemetry Monitoring -Cautious Use of Antiemetics  Hx of Depression and Anxiety -No longer taking Citalopram 20 mg po Daily and Mirtazapine 15 mg po qHS  HTN/Accelerated HTN -In the Setting of Nausea and Vomiting -Add Hydralazine IV 10 mg q6hprn for SBP>170 or DBP>100  DVT prophylaxis: Heparin 5,000 units sq q8h Code Status: FULL CODE Family Communication: Discussed with Husband at bedside Disposition Plan: Anticipate D/C Home when Nausea and Vomiting have improved and DKA reolved  Consults called: None Admission status: Obs SDU  Severity of Illness: The appropriate patient status for this patient is OBSERVATION. Observation status is judged to be reasonable and necessary in order to provide the required intensity of service to ensure the patient's safety. The patient's presenting symptoms, physical exam findings, and initial radiographic and laboratory data in the context of their medical condition is felt to place them at decreased risk for further clinical deterioration. Furthermore, it is anticipated that the patient will be medically stable for discharge from the hospital within 2 midnights of admission. The following factors support the patient status of observation.   " The patient's presenting symptoms include Nausea, vomiting, abdominal Pain. " The physical exam findings include dry membranes. " The initial radiographic and laboratory data are concerning for DKA.  Kerney Elbe, D.O. [facility] PAGER is on [place]  If 7PM-7AM, please contact night-coverage [email or web address] Password [facility]  03/22/2018, 12:05 PM

## 2018-03-22 NOTE — ED Provider Notes (Signed)
[facility]-EMERGENCY DEPT Provider Note   CSN: [id number] Arrival date & time: 03/22/18  0554     History   Chief Complaint Chief Complaint  Patient presents with  . Abdominal Pain  . Back Pain    HPI [name] is a 27 y.o. female.  The history is provided by the patient. No language interpreter was used.  Emesis   This is a recurrent problem. The current episode started 12 to 24 hours ago. The problem occurs 5 to 10 times per day. The problem has been gradually worsening. There has been no fever. Associated symptoms include abdominal pain.  Pt has a history of type 1 diabetes and has gastroparesis.  Pt reports multiple episodes of vomiting   Past Medical History:  Diagnosis Date  . Allergy   . Anemia   . Anxiety   . Blood transfusion without reported diagnosis    Dec 2018  . Cataract    right eye  . Depression   . Diabetes type 1, uncontrolled (HCC) 11/14/2011   Since age 39  . Fibromyalgia   . Gastroparesis   . GERD (gastroesophageal reflux disease)   . Hypertension   . Infection    UTI April 2016    Patient Active Problem List   Diagnosis Date Noted  . DKA (diabetic ketoacidoses) (HCC) 07/27/2017  . Normocytic anemia 07/10/2017  . SIRS (systemic inflammatory response syndrome) (HCC) 06/23/2017  . Hypokalemia 06/23/2017  . Normocytic normochromic anemia 06/23/2017  . GERD (gastroesophageal reflux disease) 06/23/2017  . Intractable nausea and vomiting 04/11/2017  . Nausea and vomiting 04/10/2017  . Essential hypertension   . Colitis   . Abscess and cellulitis of gluteal region 03/24/2017  . Cellulitis and abscess of buttock 03/23/2017  . Malnutrition of moderate degree 03/19/2017  . Anasarca 03/16/2017  . 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 Diarrhea 02/27/2017  . Lower GI bleed 02/27/2017  . DKA (diabetic ketoacidosis) (HCC) 02/26/2017  . Acute metabolic encephalopathy   . DKA, type 1  (HCC) 06/11/2013  . Orthostatic hypotension dysautonomic syndrome (HCC) 04/28/2013  . Body aches 04/28/2013  . Orthostatic hypotension 04/28/2013  . Diabetes mellitus type 1 (HCC) 04/28/2013  . Protein-calorie malnutrition, severe (HCC) 04/28/2013  . Noncompliance with diabetes treatment 09/01/2012  . Dyspnea 02/28/2012  . Sinus tachycardia 02/28/2012  . Hyperglycemia without ketosis 02/28/2012  . Chest pain 12/04/2011  . Insulin dependent diabetes mellitus (HCC) 11/14/2011  . Hypertension associated with diabetes (HCC) 11/14/2011  . Gastroparesis 11/14/2011  . Hyponatremia 09/19/2011  . Chronic abdominal pain 09/18/2011  . Metabolic alkalosis 09/18/2011  . Diabetic hyperosmolar non-ketotic state (HCC) 09/18/2011  . DIAB W/UNSPEC COMP TYPE I [JUV TYPE] UNCNTRL 06/04/2010  . CONSTIPATION 01/01/2010  . RECTAL PAIN 01/01/2010    Past Surgical History:  Procedure Laterality Date  . ANKLE SURGERY    . CHOLECYSTECTOMY  11/15/2011   Procedure: LAPAROSCOPIC CHOLECYSTECTOMY WITH INTRAOPERATIVE CHOLANGIOGRAM;  Surgeon: [name], MD;  Location: [facility];  Service: General;  Laterality: N/A;  . COLONOSCOPY    . COLONOSCOPY WITH PROPOFOL N/A 06/27/2017   Procedure: COLONOSCOPY WITH PROPOFOL;  Surgeon: Rachael Fee, MD;  Location: [facility];  Service: Endoscopy;  Laterality: N/A;  . ESOPHAGOGASTRODUODENOSCOPY  12/03/2011   Procedure: ESOPHAGOGASTRODUODENOSCOPY (EGD);  Surgeon: Theda Belfast, MD;  Location: [facility];  Service: Endoscopy;  Laterality: N/A;  . FLEXIBLE SIGMOIDOSCOPY N/A 03/10/2017   Procedure: FLEXIBLE SIGMOIDOSCOPY;  Surgeon: Jeani Hawking, MD;  Location: [facility];  Service: Endoscopy;  Laterality: N/A;  . INCISION AND DRAINAGE PERIRECTAL ABSCESS N/A 03/01/2017   Procedure: IRRIGATION AND DEBRIDEMENT PERIRECTAL ABSCESS;  Surgeon: Ovidio Kin, MD;  Location: [facility];  Service: General;  Laterality: N/A;  . IRRIGATION AND DEBRIDEMENT BUTTOCKS N/A 03/23/2017    Procedure: IRRIGATION AND DEBRIDEMENT BUTTOCKS, SETON PLACEMENT;  Surgeon: Romie Levee, MD;  Location: [facility];  Service: General;  Laterality: N/A;  . LAPAROSCOPY  11/23/2011   Procedure: LAPAROSCOPY DIAGNOSTIC;  Surgeon: Mariella Saa, MD;  Location: [facility];  Service: General;  Laterality: N/A;  . SIGMOIDOSCOPY    . UPPER GASTROINTESTINAL ENDOSCOPY    . WISDOM TOOTH EXTRACTION       OB History    Gravida  1   Para  1   Term      Preterm  1   AB      Living  1     SAB      TAB      Ectopic      Multiple  0   Live Births  1            Home Medications    Prior to Admission medications   Medication Sig Start Date End Date Taking? Authorizing Provider  acetaminophen (TYLENOL) 500 MG tablet Take 1,000 mg by mouth daily as needed for moderate pain.     [provider]  citalopram (CELEXA) 20 MG tablet Take 1 tablet (20 mg total) by mouth daily. 07/13/17   Shon Hale, MD  diclofenac sodium (VOLTAREN) 1 % GEL Apply 2 g topically 4 (four) times daily. 08/01/17   Mikhail, [name], DO  dicyclomine (BENTYL) 20 MG tablet Take 1 tablet (20 mg total) by mouth 2 (two) times daily. Patient taking differently: Take 20 mg by mouth 2 (two) times daily as needed for spasms.  12/25/17   Glade Lloyd, MD  ferrous sulfate 325 (65 FE) MG tablet Take 1 tablet (325 mg total) by mouth 2 (two) times daily with a meal. 05/13/17   Henson, Vickie L, NP-C  hyoscyamine (LEVSIN SL) 0.125 MG SL tablet Place 1 tablet (0.125 mg total) under the tongue every 6 (six) hours as needed for cramping (abdominal pain). 02/20/18   Burnadette Pop, MD  insulin lispro (HUMALOG) 100 UNIT/ML injection Inject 0-0.09 mLs (0-9 Units total) into the skin 3 (three) times daily with meals. 02/20/18   Burnadette Pop, MD  LANTUS 100 UNIT/ML injection Inject 0.12 mLs (12 Units total) into the skin daily. 02/20/18   Burnadette Pop, MD  metoCLOPramide (REGLAN) 10 MG tablet Take 1 tablet (10 mg total) by  mouth every 6 (six) hours as needed for nausea or vomiting. 02/20/18   Burnadette Pop, MD  mirtazapine (REMERON) 15 MG tablet Take 1 tablet (15 mg total) by mouth at bedtime. 01/12/18   Armbruster, Willaim Rayas, MD  ondansetron (ZOFRAN ODT) 4 MG disintegrating tablet Take 1 tablet (4 mg total) by mouth every 8 (eight) hours as needed for nausea or vomiting. 12/25/17   Glade Lloyd, MD  pantoprazole (PROTONIX) 40 MG tablet Take 1 tablet (40 mg total) by mouth 2 (two) times daily. 02/20/18   Burnadette Pop, MD  promethazine (PHENERGAN) 12.5 MG tablet TAKE 1 TABLET BY MOUTH  EVERY 8 HOURS AS NEEDED FOR NAUSEA AND VOMITING 03/11/18   Armbruster, Willaim Rayas, MD  promethazine (PHENERGAN) 25 MG suppository Place 1 suppository (25 mg total) rectally every 12 (twelve) hours as needed for refractory nausea /  vomiting. 12/25/17   Glade Lloyd, MD  sucralfate (CARAFATE) 1 g tablet Take 1 tablet (1 g total) by mouth 4 (four) times daily -  with meals and at bedtime. 09/17/17   Mancel Bale, MD    Family History Family History  Problem Relation Age of Onset  . Diabetes Mother   . Hypertension Father   . Colon cancer Paternal Grandmother        pt thinks PGM was dx in her 37's  . Esophageal cancer Neg Hx   . Liver cancer Neg Hx   . Pancreatic cancer Neg Hx   . Stomach cancer Neg Hx   . Rectal cancer Neg Hx     Social History Social History   Tobacco Use  . Smoking status: Never Smoker  . Smokeless tobacco: Never Used  Substance Use Topics  . Alcohol use: No    Alcohol/week: 0.0 standard drinks    Frequency: Never  . Drug use: No     Allergies   Other and Lactose intolerance (gi)   Review of Systems Review of Systems  Gastrointestinal: Positive for abdominal pain and vomiting.  All other systems reviewed and are negative.    Physical Exam Updated Vital Signs BP (!) 177/132   Pulse (!) 116   Temp 98.1 F (36.7 C) (Oral)   Resp (!) 25   Ht 5\' 7"  (1.702 m)   Wt 56.7 kg   SpO2  100%   BMI 19.58 kg/m   Physical Exam Constitutional:      Appearance: She is well-developed.  HENT:     Head: Normocephalic and atraumatic.     Mouth/Throat:     Mouth: Mucous membranes are moist.     Pharynx: Posterior oropharyngeal erythema present.  Eyes:     Conjunctiva/sclera: Conjunctivae normal.     Pupils: Pupils are equal, round, and reactive to light.  Neck:     Musculoskeletal: Normal range of motion and neck supple.  Cardiovascular:     Rate and Rhythm: Tachycardia present.  Pulmonary:     Effort: Pulmonary effort is normal.  Abdominal:     General: Abdomen is flat. Bowel sounds are normal.     Palpations: Abdomen is soft.     Tenderness: There is generalized abdominal tenderness.  Musculoskeletal: Normal range of motion.  Skin:    General: Skin is warm and dry.  Neurological:     Mental Status: She is alert and oriented to person, place, and time.      ED Treatments / Results  Labs (all labs ordered are listed, but only abnormal results are displayed) Labs Reviewed  COMPREHENSIVE METABOLIC PANEL - Abnormal; Notable for the following components:      Result Value   Chloride 93 (*)    CO2 21 (*)    Glucose, Bld 356 (*)    BUN 34 (*)    Creatinine, Ser 1.05 (*)    Total Bilirubin 1.7 (*)    Anion gap 23 (*)    All other components within normal limits  CBC - Abnormal; Notable for the following components:   Hemoglobin 11.4 (*)    HCT 35.3 (*)    All other components within normal limits  URINALYSIS, ROUTINE W REFLEX MICROSCOPIC - Abnormal; Notable for the following components:   Glucose, UA >=500 (*)    Hgb urine dipstick SMALL (*)    Ketones, ur 80 (*)    Protein, ur >=300 (*)    All other components within normal limits  LIPASE, BLOOD  I-STAT BETA HCG BLOOD, ED ([facility], [facility], [facility] ONLY)    EKG None  Radiology No results found.  Procedures Procedures (including critical care time)  Medications Ordered in ED Medications  sodium chloride 0.9  % bolus 1,000 mL (1,000 mLs Intravenous New Bag/Given 03/22/18 0722)    Followed by  sodium chloride 0.9 % bolus 1,000 mL (1,000 mLs Intravenous New Bag/Given 03/22/18 0723)    Followed by  0.9 %  sodium chloride infusion (0 mLs Intravenous Hold 03/22/18 0724)  metoCLOPramide (REGLAN) injection 10 mg (0 mg Intravenous Hold 03/22/18 0725)  diphenhydrAMINE (BENADRYL) injection 12.5 mg (has no administration in time range)  ondansetron (ZOFRAN) injection 4 mg (4 mg Intravenous Given 03/22/18 0724)  LORazepam (ATIVAN) injection 1 mg (1 mg Intravenous Given 03/22/18 0746)     Initial Impression / Assessment and Plan / ED Course  I have reviewed the triage vital signs and the nursing notes.  Pertinent labs & imaging results that were available during my care of the patient were reviewed by me and considered in my medical decision making (see chart for details).     MDM  Pt given IV fluids. Ua shows greater than 80 ketones,  Cmet shows glucose of 356. BUn is 34, creat is 1.05    Hospitalist consulted for admission   Final Clinical Impressions(s) / ED Diagnoses   Final diagnoses:  Diabetic ketoacidosis without coma associated with type 1 diabetes mellitus (HCC)  Gastroparesis  Acute kidney injury [facility](HCC)    ED Discharge Orders    None       Osie CheeksSofia, Renesha Lizama K, PA-C 03/22/18 1036    Arby BarrettePfeiffer, Marcy, MD 03/22/18 1539

## 2018-03-22 NOTE — Progress Notes (Signed)
Inpatient Diabetes Program Recommendations  AACE/ADA: New Consensus Statement on Inpatient Glycemic Control (2015)  Target Ranges:  Prepandial:   less than 140 mg/dL      Peak postprandial:   less than 180 mg/dL (1-2 hours)      Critically ill patients:  140 - 180 mg/dL   Lab Results  Component Value Date   GLUCAP 311 (H) 03/22/2018   HGBA1C 12.0 (H) 02/19/2018    Review of Glycemic Control  Diabetes history: DM1 Outpatient Diabetes medications: Lantus 12 units QD, Humalog 0-9 units tidwc Current orders for Inpatient glycemic control: IV insulin per DKA order set  6 ED visits and 2 admissions within past 4 months.  HgbA1C - 12% - uncontrolled  Inpatient Diabetes Program Recommendations:     IV insulin drip - continue until criteria met for discontinuation of drip. Give basal insulin 1-2 hours prior d/cing insulin drip.  Familiar with pt from previous admissions. Pt states she has been out of her Lantus and only taking Humalog at home. Still has not gotten the Lantus from mail-order over a month ago.   May need affordable insulin (NPH bid) until pt can get Lantus or another basal insulin approved from insurance company. Case management consult for above.   Continue to follow.  Thank you.  , RD, LDN, CDE Inpatient Diabetes Coordinator [phone]              

## 2018-03-23 ENCOUNTER — Encounter (HOSPITAL_COMMUNITY): Payer: Self-pay | Admitting: *Deleted

## 2018-03-23 ENCOUNTER — Inpatient Hospital Stay (HOSPITAL_COMMUNITY): Payer: 59

## 2018-03-23 DIAGNOSIS — Z79899 Other long term (current) drug therapy: Secondary | ICD-10-CM | POA: Diagnosis not present

## 2018-03-23 DIAGNOSIS — F329 Major depressive disorder, single episode, unspecified: Secondary | ICD-10-CM | POA: Diagnosis not present

## 2018-03-23 DIAGNOSIS — E860 Dehydration: Secondary | ICD-10-CM | POA: Diagnosis not present

## 2018-03-23 DIAGNOSIS — R000 Tachycardia, unspecified: Secondary | ICD-10-CM | POA: Diagnosis present

## 2018-03-23 DIAGNOSIS — E1010 Type 1 diabetes mellitus with ketoacidosis without coma: Secondary | ICD-10-CM | POA: Diagnosis not present

## 2018-03-23 DIAGNOSIS — R112 Nausea with vomiting, unspecified: Secondary | ICD-10-CM | POA: Diagnosis not present

## 2018-03-23 DIAGNOSIS — E109 Type 1 diabetes mellitus without complications: Secondary | ICD-10-CM

## 2018-03-23 DIAGNOSIS — E1069 Type 1 diabetes mellitus with other specified complication: Secondary | ICD-10-CM | POA: Diagnosis not present

## 2018-03-23 DIAGNOSIS — K3184 Gastroparesis: Secondary | ICD-10-CM | POA: Diagnosis not present

## 2018-03-23 DIAGNOSIS — N179 Acute kidney failure, unspecified: Secondary | ICD-10-CM | POA: Diagnosis not present

## 2018-03-23 DIAGNOSIS — F419 Anxiety disorder, unspecified: Secondary | ICD-10-CM | POA: Diagnosis present

## 2018-03-23 DIAGNOSIS — E1043 Type 1 diabetes mellitus with diabetic autonomic (poly)neuropathy: Secondary | ICD-10-CM | POA: Diagnosis not present

## 2018-03-23 DIAGNOSIS — R1110 Vomiting, unspecified: Secondary | ICD-10-CM | POA: Diagnosis not present

## 2018-03-23 DIAGNOSIS — M797 Fibromyalgia: Secondary | ICD-10-CM | POA: Diagnosis not present

## 2018-03-23 DIAGNOSIS — K219 Gastro-esophageal reflux disease without esophagitis: Secondary | ICD-10-CM | POA: Diagnosis not present

## 2018-03-23 DIAGNOSIS — I152 Hypertension secondary to endocrine disorders: Secondary | ICD-10-CM | POA: Diagnosis not present

## 2018-03-23 DIAGNOSIS — D649 Anemia, unspecified: Secondary | ICD-10-CM | POA: Diagnosis not present

## 2018-03-23 DIAGNOSIS — I10 Essential (primary) hypertension: Secondary | ICD-10-CM | POA: Diagnosis not present

## 2018-03-23 LAB — CBC WITH DIFFERENTIAL/PLATELET
Abs Immature Granulocytes: 0.06 10*3/uL (ref 0.00–0.07)
Basophils Absolute: 0 10*3/uL (ref 0.0–0.1)
Basophils Relative: 0 %
Eosinophils Absolute: 0 10*3/uL (ref 0.0–0.5)
Eosinophils Relative: 0 %
HCT: 31.5 % — ABNORMAL LOW (ref 36.0–46.0)
Hemoglobin: 10.1 g/dL — ABNORMAL LOW (ref 12.0–15.0)
Immature Granulocytes: 1 %
Lymphocytes Relative: 30 %
Lymphs Abs: 2.6 10*3/uL (ref 0.7–4.0)
MCH: 29.1 pg (ref 26.0–34.0)
MCHC: 32.1 g/dL (ref 30.0–36.0)
MCV: 90.8 fL (ref 80.0–100.0)
Monocytes Absolute: 0.7 10*3/uL (ref 0.1–1.0)
Monocytes Relative: 8 %
Neutro Abs: 5.2 10*3/uL (ref 1.7–7.7)
Neutrophils Relative %: 61 %
Platelets: 226 10*3/uL (ref 150–400)
RBC: 3.47 MIL/uL — ABNORMAL LOW (ref 3.87–5.11)
RDW: 12.7 % (ref 11.5–15.5)
WBC: 8.7 10*3/uL (ref 4.0–10.5)
nRBC: 0 % (ref 0.0–0.2)

## 2018-03-23 LAB — GLUCOSE, CAPILLARY
Glucose-Capillary: 113 mg/dL — ABNORMAL HIGH (ref 70–99)
Glucose-Capillary: 134 mg/dL — ABNORMAL HIGH (ref 70–99)
Glucose-Capillary: 153 mg/dL — ABNORMAL HIGH (ref 70–99)
Glucose-Capillary: 161 mg/dL — ABNORMAL HIGH (ref 70–99)
Glucose-Capillary: 65 mg/dL — ABNORMAL LOW (ref 70–99)
Glucose-Capillary: 86 mg/dL (ref 70–99)
Glucose-Capillary: 89 mg/dL (ref 70–99)

## 2018-03-23 LAB — IRON AND TIBC
Iron: 173 ug/dL — ABNORMAL HIGH (ref 28–170)
Saturation Ratios: 68 % — ABNORMAL HIGH (ref 10.4–31.8)
TIBC: 253 ug/dL (ref 250–450)
UIBC: 80 ug/dL

## 2018-03-23 LAB — RETICULOCYTES
Immature Retic Fract: 3.3 % (ref 2.3–15.9)
RBC.: 3.47 MIL/uL — ABNORMAL LOW (ref 3.87–5.11)
Retic Count, Absolute: 69.4 10*3/uL (ref 19.0–186.0)
Retic Ct Pct: 2 % (ref 0.4–3.1)

## 2018-03-23 LAB — RAPID URINE DRUG SCREEN, HOSP PERFORMED
Amphetamines: NOT DETECTED
Barbiturates: NOT DETECTED
Benzodiazepines: POSITIVE — AB
Cocaine: NOT DETECTED
Opiates: POSITIVE — AB
Tetrahydrocannabinol: POSITIVE — AB

## 2018-03-23 LAB — FERRITIN: Ferritin: 74 ng/mL (ref 11–307)

## 2018-03-23 LAB — COMPREHENSIVE METABOLIC PANEL
ALT: 16 U/L (ref 0–44)
AST: 17 U/L (ref 15–41)
Albumin: 3.2 g/dL — ABNORMAL LOW (ref 3.5–5.0)
Alkaline Phosphatase: 65 U/L (ref 38–126)
Anion gap: 8 (ref 5–15)
BUN: 21 mg/dL — ABNORMAL HIGH (ref 6–20)
CO2: 25 mmol/L (ref 22–32)
Calcium: 7.9 mg/dL — ABNORMAL LOW (ref 8.9–10.3)
Chloride: 104 mmol/L (ref 98–111)
Creatinine, Ser: 0.51 mg/dL (ref 0.44–1.00)
GFR calc Af Amer: >60 mL/min (ref >60)
GFR calc non Af Amer: >60 mL/min (ref >60)
Glucose, Bld: 93 mg/dL (ref 70–99)
Potassium: 3.6 mmol/L (ref 3.5–5.1)
Sodium: 137 mmol/L (ref 135–145)
Total Bilirubin: 1 mg/dL (ref 0.3–1.2)
Total Protein: 6.3 g/dL — ABNORMAL LOW (ref 6.5–8.1)

## 2018-03-23 LAB — PHOSPHORUS: Phosphorus: 1.9 mg/dL — ABNORMAL LOW (ref 2.5–4.6)

## 2018-03-23 LAB — VITAMIN B12: Vitamin B-12: 981 pg/mL — ABNORMAL HIGH (ref 180–914)

## 2018-03-23 LAB — FOLATE: Folate: 11.4 ng/mL (ref >5.9)

## 2018-03-23 LAB — HIV ANTIBODY (ROUTINE TESTING W REFLEX): HIV Screen 4th Generation wRfx: NONREACTIVE

## 2018-03-23 LAB — LIPASE, BLOOD: Lipase: 22 U/L (ref 11–51)

## 2018-03-23 LAB — MAGNESIUM: Magnesium: 2 mg/dL (ref 1.7–2.4)

## 2018-03-23 IMAGING — CT CT ABD-PELV W/ CM
2 of 4 series · 15 of 46 positions shown, 17 images · IV contrast (APPLIED)
Comparison: [DATE]

CLINICAL DATA: Upper abdominal and back pain since [REDACTED] with
nausea and vomiting.

EXAM:
CT ABDOMEN AND PELVIS WITH CONTRAST
TECHNIQUE: Multidetector CT imaging of the abdomen and pelvis was performed
using the standard protocol following bolus administration of
intravenous contrast.
CONTRAST:  100mL OMNIPAQUE IOHEXOL 300 MG/ML  SOLN

[Series 2: axial st · axial · 0.64mm/px · z∈[-378,+47]mm · 12 of 95 slices shown, 14 images]
[im 5/95  soft-tissue]
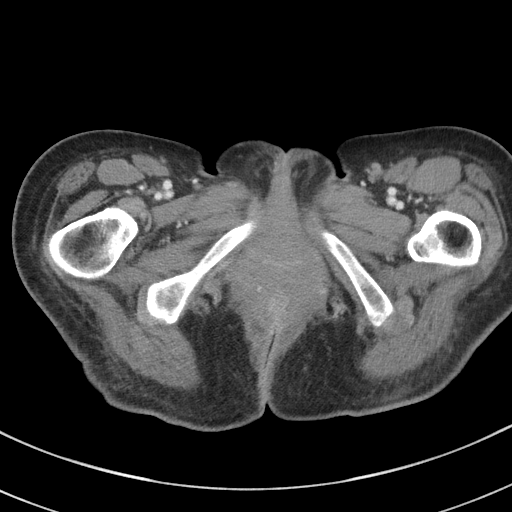
[im 5/95  bone]
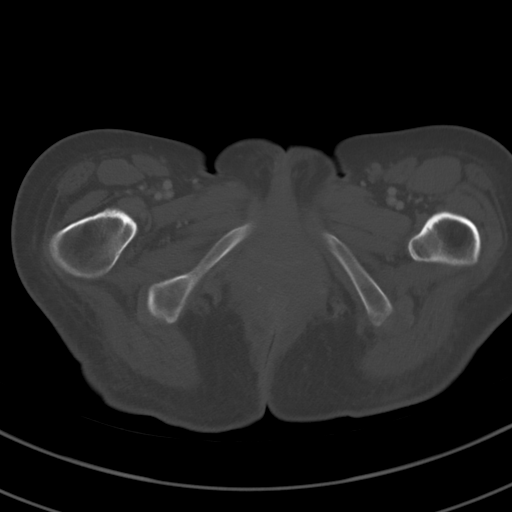
[im 15/95  soft-tissue]
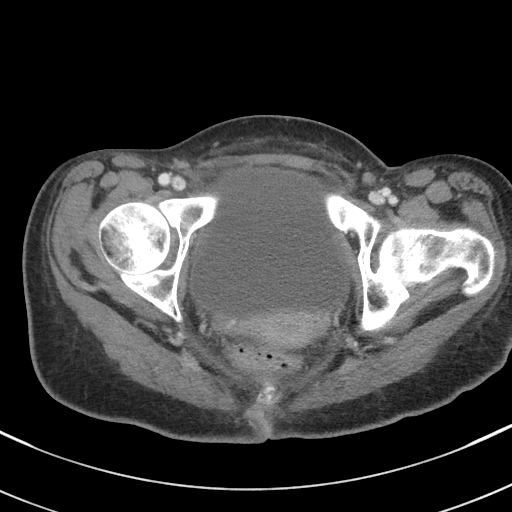
[im 20/95  soft-tissue]
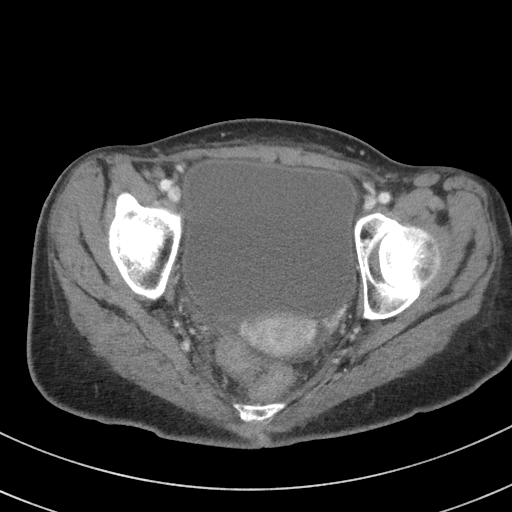
[im 30/95  soft-tissue]
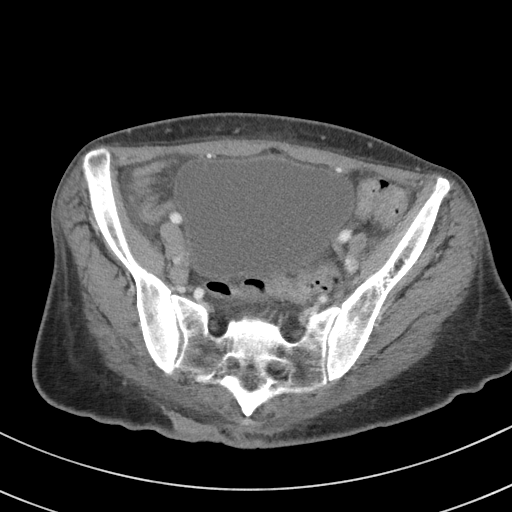
[im 35/95  soft-tissue]
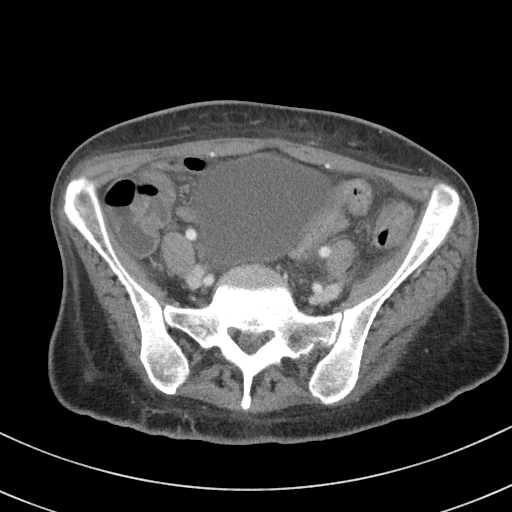
[im 45/95  soft-tissue]
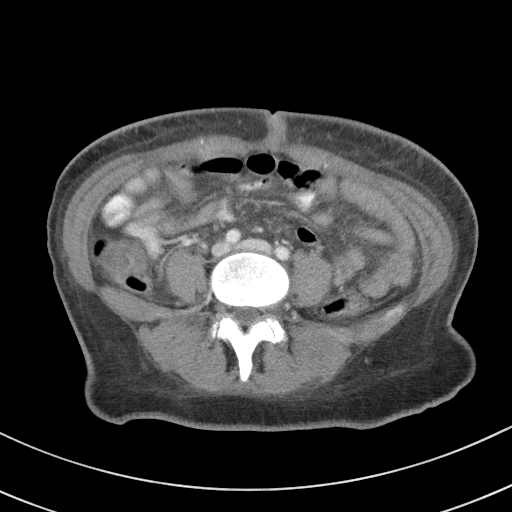
[im 50/95  soft-tissue]
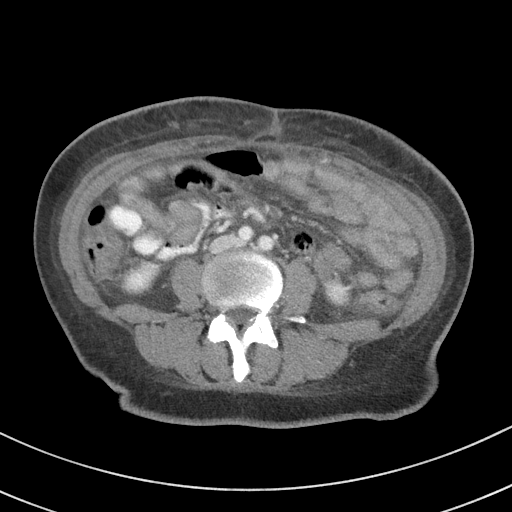
[im 60/95  soft-tissue]
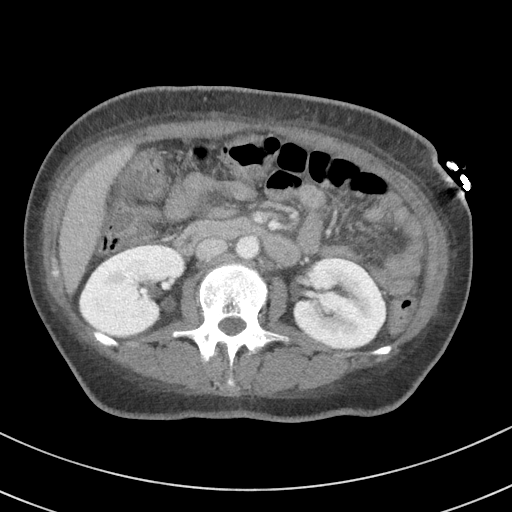
[im 65/95  soft-tissue]
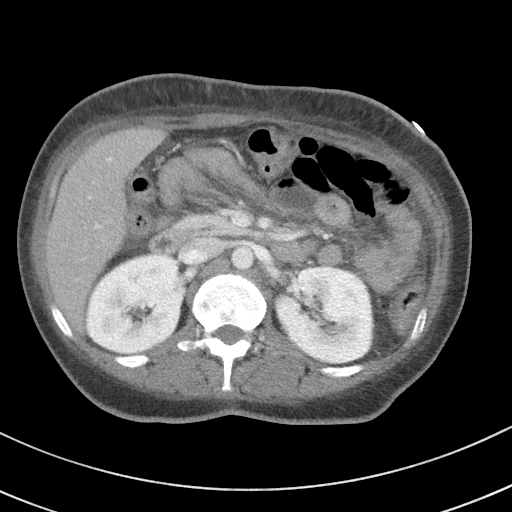
[im 65/95  bone]
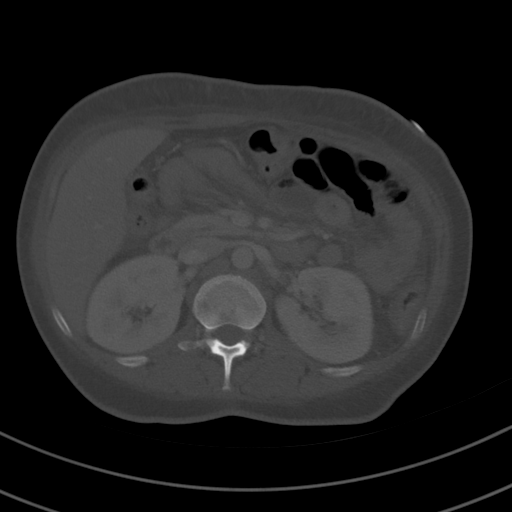
[im 75/95  soft-tissue]
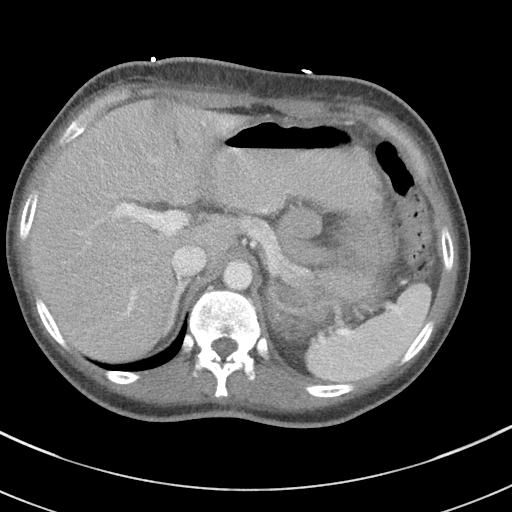
[im 80/95  soft-tissue]
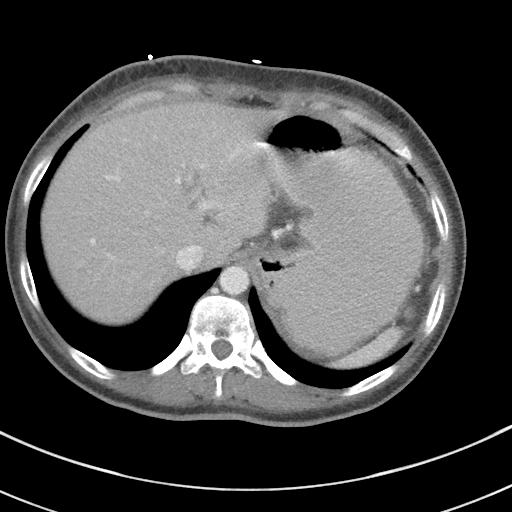
[im 90/95  soft-tissue]
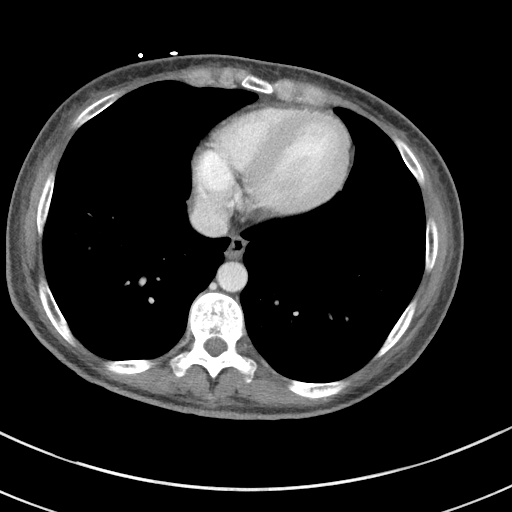

[Series 5: coronal st · coronal · 0.61mm/px · 3 of 78 slices shown]
[im 26/78  soft-tissue]
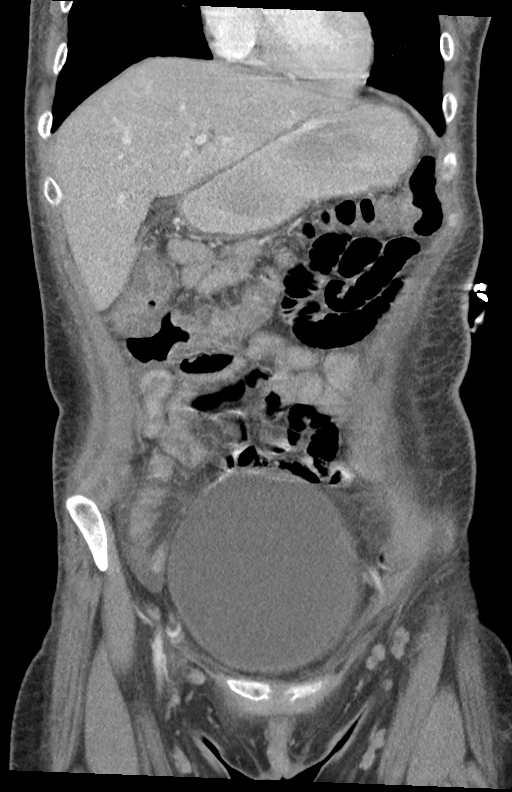
[im 35/78  soft-tissue]
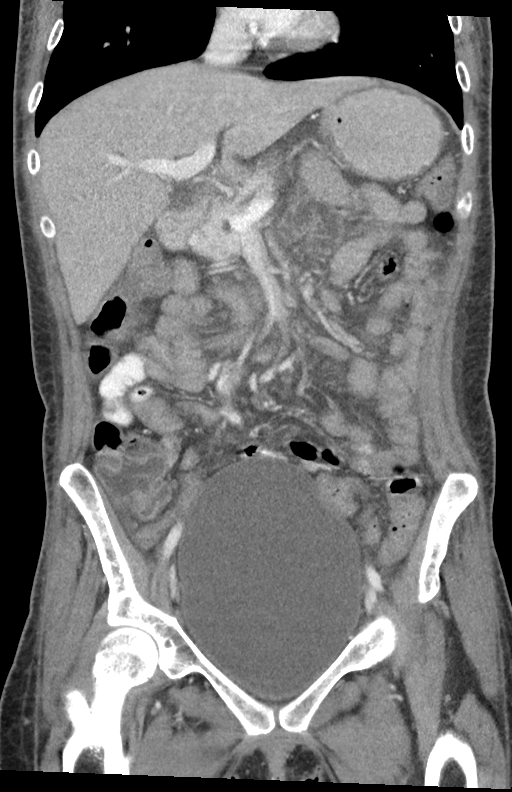
[im 43/78  soft-tissue]
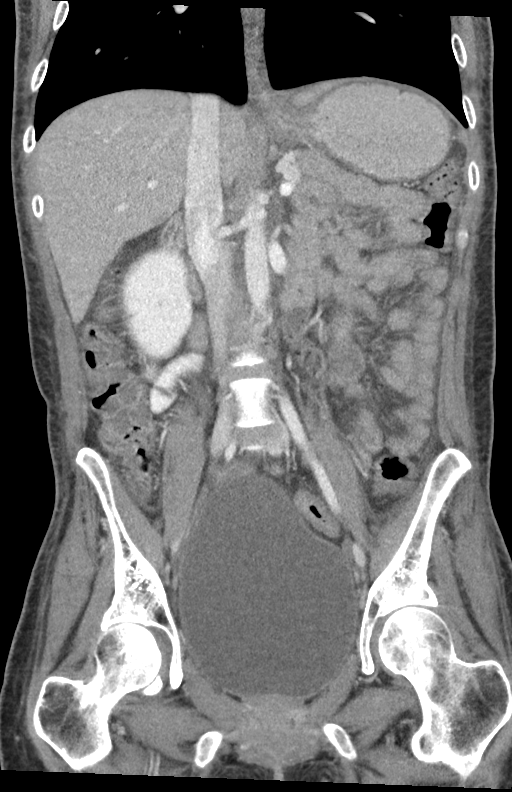

[15 of 46 positions shown; findings below may reference images not displayed]

FINDINGS: Lower chest: No acute abnormality.

Hepatobiliary: No focal liver abnormality is seen. Hypodense
appearance of the liver parenchyma compatible with mild steatosis.
Status post cholecystectomy. No biliary dilatation.

Pancreas: Atrophic without mass or inflammation.

Spleen: Normal

Adrenals/Urinary Tract: Normal bilateral adrenal glands and kidneys.
Physiologic distention of the urinary bladder without focal mural
thickening or calculus.

Stomach/Bowel: Distended stomach with ingested oral contrast. The
possibility of gastroparesis or delayed gastric emptying is a
possibility. Nonobstructed, nondistended small bowel loops. There is
generalized mild transmural thickening of the colon. This in
conjunction with hazy mesenteric edema is nonspecific but may
represent stigmata of enterocolitis.

Vascular/Lymphatic: No significant vascular findings are present. No
enlarged abdominal or pelvic lymph nodes.

Reproductive: Uterus and bilateral adnexa are unremarkable.

Other: No free air. Mild soft tissue anasarca, slightly more focal
overlying the sacrum.

Musculoskeletal: No acute or significant osseous findings.
IMPRESSION: 1. Distended stomach with ingested oral contrast. The possibility of
gastroparesis or delayed gastric emptying is a possibility.
2. Mild transmural thickened appearance of the small and large
intestine with mesenteric edema, nonspecific but consider
inflammatory bowel disease, enterocolitis, hypoproteinemia, unlikely
ischemia among some considerations.
3. Mild hepatic steatosis.

## 2018-03-23 MED ORDER — FENTANYL CITRATE (PF) 100 MCG/2ML IJ SOLN
25.0000 ug | INTRAMUSCULAR | Status: DC | PRN
  Administered 2018-03-23 (×3): 25 ug via INTRAVENOUS
  Filled 2018-03-23 (×3): qty 2

## 2018-03-23 MED ORDER — METOCLOPRAMIDE HCL 10 MG PO TABS: 10.0000 mg | ORAL_TABLET | Freq: Four times a day (QID) | ORAL | Status: DC | PRN

## 2018-03-23 MED ORDER — SODIUM CHLORIDE 0.9 % IV BOLUS
1000.0000 mL | Freq: Once | INTRAVENOUS | Status: AC
  Administered 2018-03-23: 1000 mL via INTRAVENOUS

## 2018-03-23 MED ORDER — LORAZEPAM 2 MG/ML IJ SOLN
0.5000 mg | Freq: Four times a day (QID) | INTRAMUSCULAR | Status: AC | PRN
  Administered 2018-03-24: 0.5 mg via INTRAVENOUS
  Filled 2018-03-23: qty 1

## 2018-03-23 MED ORDER — SODIUM CHLORIDE (PF) 0.9 % IJ SOLN
INTRAMUSCULAR | Status: AC
  Filled 2018-03-23: qty 50

## 2018-03-23 MED ORDER — MORPHINE SULFATE (PF) 2 MG/ML IV SOLN
2.0000 mg | INTRAVENOUS | Status: DC | PRN
  Administered 2018-03-23 – 2018-03-25 (×12): 2 mg via INTRAVENOUS
  Filled 2018-03-23 (×11): qty 1

## 2018-03-23 MED ORDER — ALPRAZOLAM 1 MG PO TABS
1.0000 mg | ORAL_TABLET | Freq: Three times a day (TID) | ORAL | Status: DC
  Administered 2018-03-23 (×2): 1 mg via ORAL
  Filled 2018-03-23 (×2): qty 1

## 2018-03-23 MED ORDER — HYDRALAZINE HCL 20 MG/ML IJ SOLN
5.0000 mg | Freq: Four times a day (QID) | INTRAMUSCULAR | Status: DC | PRN
  Administered 2018-03-24 – 2018-03-25 (×3): 5 mg via INTRAVENOUS
  Filled 2018-03-23 (×3): qty 1

## 2018-03-23 MED ORDER — SENNOSIDES-DOCUSATE SODIUM 8.6-50 MG PO TABS: 1.0000 | ORAL_TABLET | Freq: Every evening | ORAL | Status: DC | PRN

## 2018-03-23 MED ORDER — PANTOPRAZOLE SODIUM 40 MG PO TBEC
40.0000 mg | DELAYED_RELEASE_TABLET | Freq: Two times a day (BID) | ORAL | Status: DC
  Administered 2018-03-23 – 2018-03-24 (×3): 40 mg via ORAL
  Filled 2018-03-23 (×3): qty 1

## 2018-03-23 MED ORDER — POLYETHYLENE GLYCOL 3350 17 G PO PACK: 17.0000 g | PACK | Freq: Every day | ORAL | Status: DC | PRN

## 2018-03-23 MED ORDER — IOHEXOL 300 MG/ML  SOLN
100.0000 mL | Freq: Once | INTRAMUSCULAR | Status: AC | PRN
  Administered 2018-03-23: 100 mL via INTRAVENOUS

## 2018-03-23 MED ORDER — OXYCODONE-ACETAMINOPHEN 5-325 MG PO TABS
1.0000 | ORAL_TABLET | ORAL | Status: DC | PRN
  Administered 2018-03-24 – 2018-03-25 (×3): 1 via ORAL
  Filled 2018-03-23 (×3): qty 1

## 2018-03-23 MED ORDER — METOCLOPRAMIDE HCL 5 MG/ML IJ SOLN
10.0000 mg | Freq: Three times a day (TID) | INTRAMUSCULAR | Status: DC
  Administered 2018-03-23 – 2018-03-24 (×2): 10 mg via INTRAVENOUS
  Filled 2018-03-23 (×2): qty 2

## 2018-03-23 MED ORDER — MORPHINE SULFATE (PF) 2 MG/ML IV SOLN
INTRAVENOUS | Status: AC
  Filled 2018-03-23: qty 1

## 2018-03-23 MED ORDER — LORAZEPAM 2 MG/ML IJ SOLN
1.0000 mg | Freq: Four times a day (QID) | INTRAMUSCULAR | Status: DC | PRN
  Administered 2018-03-23: 1 mg via INTRAVENOUS
  Filled 2018-03-23: qty 1

## 2018-03-23 NOTE — Progress Notes (Signed)
Pt off telemetry to go to CT per MD order

## 2018-03-23 NOTE — Progress Notes (Signed)
Pt back from CT and placed back on telemetry , spouse at bedside.

## 2018-03-23 NOTE — Progress Notes (Signed)
Hypoglycemic Event  CBG: 58  Treatment: 4 oz of apple juice, graham crackers and peanut butter  Symptoms: asymptomatic  Follow-up CBG: Time: 0019 CBG Result:65  Possible Reasons for Event: pt came off DKA protocol  Comments/MD notified: K. Schorr made aware    [name]

## 2018-03-23 NOTE — Progress Notes (Signed)
Pt unable to keep contrast down for CT scan, MD notified. PRN hydralazine given for b/p 195/125. PRN phenergan also given for n/v

## 2018-03-23 NOTE — Progress Notes (Signed)
PROGRESS NOTE    RIELLY BRUNN  KGS:[id number] DOB: [birth year] DOA: 03/22/2018 PCP: Patient, No Pcp Per   Brief Narrative:  27 year old female with history of diabetes mellitus type 1, anxiety, depression, diarrhea, gastroparesis, fibromyalgia, hypertension came to hospital complains of nausea vomiting and abdominal pain.  She was found to be diabetic ketoacidosis which was treated overnight with routine DKA protocol.  The following day she was reporting of back pain.   Assessment & Plan:   Active Problems:   Hypertension associated with diabetes ([place])   Gastroparesis   Sinus tachycardia   Diabetes mellitus type 1 (HCC)   DKA, type 1 ([place])   MDD (major depressive disorder), recurrent episode, mild (HCC)   Essential hypertension   Intractable nausea and vomiting   Normocytic normochromic anemia   GERD (gastroesophageal reflux disease)   DKA, type 1, not at goal [facility])   AKI (acute kidney injury) ([place])   Hyperbilirubinemia  Severe acute abdominal pain Diabetic ketoacidosis in setting of uncontrolled diabetes mellitus type 1 -DKA has resolved, transition to subcutaneous insulin with sliding scale -Hemoglobin A1c 12.0.  Issues with noncompliance - Continue Accu-Cheks -VOPFYT-24; UDS- pending.  -We will get CT of the abdomen pelvis with contrast, we can add p.o. contrast if she tolerates oral.  Sinus tachycardia - Likely combination of pain and dehydration.  Wonder if she is also withdrawing therefore we will keep a close eye out on her.  -IV morphine and oral oxycodone.  Diabetic gastroparesis - Motility agents added.  Continue home medications.  Supportive care.   Normocytic anemia -Continue iron supplements.  Supportive care.  Essential hypertension -IV hydralazine PRN.  We will slowly add p.o. medications as necessary  History of depression and anxiety -No longer taking citalopram and mirtazapine. Xanax added for anxiety  DVT prophylaxis: Hep SQ Code Status: Full  Code  Family Communication: None at Bedside  Disposition Plan: Patient will been need to be inpatient as currently she is having significant amount of nausea.  Poor oral intake.  Consultants:   None  Procedures:   None  Antimicrobials:   None   Subjective: Reports of abdominal pain going to her back along with feeling of nausea.  Review of Systems Otherwise negative except as per HPI, including: General: Denies fever, chills, night sweats or unintended weight loss. Resp: Denies cough, wheezing, shortness of breath. Cardiac: Denies chest pain, palpitations, orthopnea, paroxysmal nocturnal dyspnea. GI: Deniesvomiting, diarrhea or constipation GU: Denies dysuria, frequency, hesitancy or incontinence MS: Denies muscle aches, joint pain or swelling Neuro: Denies headache, neurologic deficits (focal weakness, numbness, tingling), abnormal gait Psych: Denies anxiety, depression, SI/HI/AVH Skin: Denies new rashes or lesions ID: Denies sick contacts, exotic exposures, travel  Objective: Vitals:   03/23/18 0600 03/23/18 0700 03/23/18 0800 03/23/18 0820  BP: 128/75 123/81 (!) 169/105 (!) 174/108  Pulse: 91 92 (!) 114 (!) 106  Resp:  14 14 15   Temp:   97.8 F (36.6 C)   TempSrc:   Oral   SpO2: 99% 99% 100% 98%  Weight:      Height:        Intake/Output Summary (Last 24 hours) at 03/23/2018 1046 Last data filed at 03/23/2018 1000 Gross per 24 hour  Intake 2636.59 ml  Output 400 ml  Net 2236.59 ml   Filed Weights   03/22/18 0601 03/22/18 1200  Weight: 56.7 kg 56.3 kg    Examination:  General exam: Mild to moderate distress due to pain Respiratory system: Clear to auscultation. Respiratory  effort normal. Cardiovascular system: Sinus tachycardia, S1 & S2 heard, RRR. No JVD, murmurs, rubs, gallops or clicks. No pedal edema. Gastrointestinal system: Abdomen is nondistended, soft and nontender. No organomegaly or masses felt. Normal bowel sounds heard. Central nervous  system: Alert and oriented. No focal neurological deficits. Extremities: Symmetric 5 x 5 power. Skin: No rashes, lesions or ulcers Psychiatry: Judgement and insight appear normal. Mood & affect appropriate.     Data Reviewed:   CBC: Recent Labs  Lab 03/22/18 0621 03/23/18 0307  WBC 9.0 8.7  NEUTROABS  --  5.2  HGB 11.4* 10.1*  HCT 35.3* 31.5*  MCV 89.1 90.8  PLT 252 790   Basic Metabolic Panel: Recent Labs  Lab 03/22/18 0621 03/22/18 1326 03/22/18 1637 03/22/18 2022 03/23/18 0307  NA 137 137 141 141 137  K 3.8 3.9 3.7 3.8 3.6  CL 93* 100 106 105 104  CO2 21* 18* 23 26 25   GLUCOSE 356* 348* 228* 68* 93  BUN 34* 28* 26* 25* 21*  CREATININE 1.05* 0.94 0.80 0.61 0.51  CALCIUM 9.1 8.4* 8.4* 8.3* 7.9*  MG  --  2.0  --   --  2.0  PHOS  --  3.1  --   --  1.9*   GFR: Estimated Creatinine Clearance: 93.1 mL/min (by C-G formula based on SCr of 0.51 mg/dL). Liver Function Tests: Recent Labs  Lab 03/22/18 0621 03/23/18 0307  AST 21 17  ALT 21 16  ALKPHOS 89 65  BILITOT 1.7* 1.0  PROT 7.9 6.3*  ALBUMIN 4.2 3.2*   Recent Labs  Lab 03/22/18 0621 03/23/18 0307  LIPASE 19 22   No results for input(s): AMMONIA in the last 168 hours. Coagulation Profile: No results for input(s): INR, PROTIME in the last 168 hours. Cardiac Enzymes: No results for input(s): CKTOTAL, CKMB, CKMBINDEX, TROPONINI in the last 168 hours. BNP (last 3 results) No results for input(s): PROBNP in the last 8760 hours. HbA1C: No results for input(s): HGBA1C in the last 72 hours. CBG: Recent Labs  Lab 03/22/18 2354 03/23/18 0019 03/23/18 0037 03/23/18 0337 03/23/18 0801  GLUCAP 58* 65* 86 89 113*   Lipid Profile: No results for input(s): CHOL, HDL, LDLCALC, TRIG, CHOLHDL, LDLDIRECT in the last 72 hours. Thyroid Function Tests: No results for input(s): TSH, T4TOTAL, FREET4, T3FREE, THYROIDAB in the last 72 hours. Anemia Panel: Recent Labs    03/23/18 0307  VITAMINB12 981*  FOLATE  11.4  FERRITIN 74  TIBC 253  IRON 173*  RETICCTPCT 2.0   Sepsis Labs: No results for input(s): PROCALCITON, LATICACIDVEN in the last 168 hours.  Recent Results (from the past 240 hour(s))  MRSA PCR Screening     Status: None   Collection Time: 03/22/18 12:33 PM  Result Value Ref Range Status   MRSA by PCR NEGATIVE NEGATIVE Final    Comment:        The GeneXpert MRSA Assay (FDA approved for NASAL specimens only), is one component of a comprehensive MRSA colonization surveillance program. It is not intended to diagnose MRSA infection nor to guide or monitor treatment for MRSA infections. Performed at [facility], [place] [address] [place], [place] [address]   Culture, blood (routine x 2)     Status: None (Preliminary result)   Collection Time: 03/22/18  1:25 PM  Result Value Ref Range Status   Specimen Description   Final    BLOOD LEFT HAND Performed at [facility] [address] [place],  [place] [address]    Special Requests   Final    BOTTLES DRAWN AEROBIC ONLY Blood Culture results may not be optimal due to an excessive volume of blood received in culture bottles Performed at [facility] [address], [place], [place] [address]    Culture   Final    NO GROWTH < 24 HOURS Performed at [facility] [address] [place], [place] [address]    Report Status PENDING  Incomplete  Culture, blood (routine x 2)     Status: None (Preliminary result)   Collection Time: 03/22/18  1:25 PM  Result Value Ref Range Status   Specimen Description   Final    BLOOD RIGHT HAND Performed at [facility] [address], [place], [place] [address]    Special Requests   Final    BOTTLES DRAWN AEROBIC AND ANAEROBIC Blood Culture adequate volume Performed at [facility] [address] [place], [place] [address]    Culture   Final    NO GROWTH < 24 HOURS Performed at [facility] [address] [place], [place] [address]    Report Status PENDING  Incomplete         Radiology Studies: No results found.      Scheduled Meds: . morphine      . ferrous sulfate  325 mg Oral BID WC  . heparin  5,000 Units Subcutaneous Q8H  . insulin aspart  0-15 Units Subcutaneous TID WC  . insulin glargine  5 Units Subcutaneous QHS  . metoCLOPramide (REGLAN) injection  10 mg Intravenous Once  . pantoprazole  40 mg Oral BID   Continuous Infusions: . sodium chloride Stopped (03/22/18 1109)  . sodium chloride Stopped (03/22/18 2340)  . sodium chloride 75 mL/hr at 03/23/18 1000     LOS: 0 days   Time spent= 45 mins    Analiah Drum Arsenio Loader, MD [facility] Pager [phone]   If 7PM-7AM, please contact night-coverage [email or web address] Password [facility] 03/23/2018, 10:46 AM

## 2018-03-24 LAB — GLUCOSE, CAPILLARY
Glucose-Capillary: 182 mg/dL — ABNORMAL HIGH (ref 70–99)
Glucose-Capillary: 179 mg/dL — ABNORMAL HIGH (ref 70–99)
Glucose-Capillary: 137 mg/dL — ABNORMAL HIGH (ref 70–99)
Glucose-Capillary: 186 mg/dL — ABNORMAL HIGH (ref 70–99)

## 2018-03-24 MED ORDER — MORPHINE SULFATE (PF) 2 MG/ML IV SOLN
2.0000 mg | Freq: Once | INTRAVENOUS | Status: AC
  Administered 2018-03-24: 2 mg via INTRAVENOUS
  Filled 2018-03-24: qty 1

## 2018-03-24 MED ORDER — SACCHAROMYCES BOULARDII 250 MG PO CAPS
250.0000 mg | ORAL_CAPSULE | Freq: Two times a day (BID) | ORAL | Status: DC
  Administered 2018-03-24: 250 mg via ORAL
  Filled 2018-03-24: qty 1

## 2018-03-24 MED ORDER — METOCLOPRAMIDE HCL 5 MG/ML IJ SOLN
10.0000 mg | Freq: Three times a day (TID) | INTRAMUSCULAR | Status: DC
  Administered 2018-03-24 – 2018-03-25 (×5): 10 mg via INTRAVENOUS
  Filled 2018-03-24 (×5): qty 2

## 2018-03-24 MED ORDER — SODIUM CHLORIDE 0.9 % IV BOLUS
1000.0000 mL | Freq: Once | INTRAVENOUS | Status: AC
  Administered 2018-03-24: 1000 mL via INTRAVENOUS

## 2018-03-24 MED ORDER — METRONIDAZOLE IN NACL 5-0.79 MG/ML-% IV SOLN
500.0000 mg | Freq: Three times a day (TID) | INTRAVENOUS | Status: DC
  Administered 2018-03-24 – 2018-03-25 (×4): 500 mg via INTRAVENOUS
  Filled 2018-03-24 (×4): qty 100

## 2018-03-24 MED ORDER — CIPROFLOXACIN IN D5W 400 MG/200ML IV SOLN
400.0000 mg | Freq: Two times a day (BID) | INTRAVENOUS | Status: DC
  Administered 2018-03-24 – 2018-03-25 (×3): 400 mg via INTRAVENOUS
  Filled 2018-03-24 (×3): qty 200

## 2018-03-24 MED ORDER — SODIUM CHLORIDE 0.9 % IV SOLN
INTRAVENOUS | Status: DC | PRN
  Administered 2018-03-24: 250 mL via INTRAVENOUS
  Administered 2018-03-24: 500 mL via INTRAVENOUS

## 2018-03-24 NOTE — Progress Notes (Signed)
PROGRESS NOTE    [name]  YKD:[id number] DOB: 1990-05-10 DOA: 03/22/2018 PCP: Patient, No Pcp Per   Brief Narrative:  27 year old female with history of diabetes mellitus type 1, anxiety, depression, diarrhea, gastroparesis, fibromyalgia, hypertension came to hospital complains of nausea vomiting and abdominal pain.  She was found to be diabetic ketoacidosis which was treated overnight with routine DKA protocol.  The following day she was reporting of back pain and abdominal pain therefore CT of the abdomen pelvis was done which showed distended stomach consistent with gastroparesis, mild transmural thickening small and large intestine concerning for possible inflammatory bowel disease.  Patient was started on Cipro and Flagyl.   Assessment & Plan:   Active Problems:   Hypertension associated with diabetes ([place])   Gastroparesis   Sinus tachycardia   Diabetes mellitus type 1 ([place])   DKA, type 1 ([place])   MDD (major depressive disorder), recurrent episode, mild ([facility])   Essential hypertension   Intractable nausea and vomiting   Normocytic normochromic anemia   GERD (gastroesophageal reflux disease)   DKA, type 1, not at goal [facility])   AKI (acute kidney injury) ([place])   Hyperbilirubinemia  Severe acute abdominal pain with possible inflammatory bowel disease Diabetic ketoacidosis in setting of uncontrolled diabetes mellitus type 1 -DKA has resolved, transition to subcutaneous insulin with sliding scale -Hemoglobin A1c 12.0 - 02/19/18.  Issues with noncompliance - Continue Accu-Cheks -Lipase-22; UDS-benzo, opiates and THC -CT of the abdomen pelvis shows suggestions of gastroparesis, inflammatory bowel disease -Start Cipro and Flagyl, add Florastor  Sinus tachycardia - Combination of pain and dehydration.  We will give her 1 more liter of normal saline bolus -IV morphine and oral oxycodone.  Diabetic gastroparesis - Continue motility agent, time and before meals  Normocytic  anemia -Continue iron supplements.  Supportive care.  Essential hypertension -IV hydralazine PRN.  We will slowly add p.o. medications as necessary  History of depression and anxiety -No longer taking citalopram and mirtazapine. Xanax added for anxiety  DVT prophylaxis: Hep SQ Code Status: Full Code  Family Communication: None at Bedside  Disposition Plan: Maintain inpatient stay until p.o. intake improves  Consultants:   None  Procedures:   None  Antimicrobials:   None   Subjective: Patient still continues to feel nauseous and has extremely poor oral intake.  Review of Systems Otherwise negative except as per HPI, including: General = no fevers, chills, dizziness, malaise, fatigue HEENT/EYES = negative for pain, redness, loss of vision, double vision, blurred vision, loss of hearing, sore throat, hoarseness, dysphagia Cardiovascular= negative for chest pain, palpitation, murmurs, lower extremity swelling Respiratory/lungs= negative for shortness of breath, cough, hemoptysis, wheezing, mucus production Gastrointestinal= negative formelena, hematemesis Genitourinary= negative for Dysuria, Hematuria, Change in Urinary Frequency MSK = Negative for arthralgia, myalgias, Back Pain, Joint swelling  Neurology= Negative for headache, seizures, numbness, tingling  Psychiatry= Negative for anxiety, depression, suicidal and homocidal ideation Allergy/Immunology= Medication/Food allergy as listed  Skin= Negative for Rash, lesions, ulcers, itching   Objective: Vitals:   03/24/18 0430 03/24/18 0530 03/24/18 0600 03/24/18 0735  BP: 114/64 120/70 (!) 170/97   Pulse: 93 91 (!) 129 (!) 112  Resp: 12 15 15 16   Temp:    98.2 F (36.8 C)  TempSrc:    Oral  SpO2: 99% 98% 100% 100%  Weight:      Height:        Intake/Output Summary (Last 24 hours) at 03/24/2018 0926 Last data filed at 03/24/2018 0400 Gross per 24  hour  Intake 2649.95 ml  Output 400 ml  Net 2249.95 ml    Filed Weights   03/22/18 0601 03/22/18 1200  Weight: 56.7 kg 56.3 kg    Examination:  Constitutional: Slight distress due to abdominal discomfort Eyes: PERRL, lids and conjunctivae normal ENMT: Mucous membranes are dry posterior pharynx clear of any exudate or lesions.Normal dentition.  Neck: normal, supple, no masses, no thyromegaly Respiratory: clear to auscultation bilaterally, no wheezing, no crackles. Normal respiratory effort. No accessory muscle use.  Cardiovascular: Sinus tachycardia, no murmurs / rubs / gallops. No extremity edema. 2+ pedal pulses. No carotid bruits.  Abdomen: no tenderness, no masses palpated. No hepatosplenomegaly. Bowel sounds positive.  Musculoskeletal: no clubbing / cyanosis. No joint deformity upper and lower extremities. Good ROM, no contractures. Normal muscle tone.  Skin: no rashes, lesions, ulcers. No induration Neurologic: CN 2-12 grossly intact. Sensation intact, DTR normal. Strength 5/5 in all 4.  Psychiatric: Normal judgment and insight. Alert and oriented x 3. Normal mood.     Data Reviewed:   CBC: Recent Labs  Lab 03/22/18 0621 03/23/18 0307  WBC 9.0 8.7  NEUTROABS  --  5.2  HGB 11.4* 10.1*  HCT 35.3* 31.5*  MCV 89.1 90.8  PLT 252 774   Basic Metabolic Panel: Recent Labs  Lab 03/22/18 0621 03/22/18 1326 03/22/18 1637 03/22/18 2022 03/23/18 0307  NA 137 137 141 141 137  K 3.8 3.9 3.7 3.8 3.6  CL 93* 100 106 105 104  CO2 21* 18* 23 26 25   GLUCOSE 356* 348* 228* 68* 93  BUN 34* 28* 26* 25* 21*  CREATININE 1.05* 0.94 0.80 0.61 0.51  CALCIUM 9.1 8.4* 8.4* 8.3* 7.9*  MG  --  2.0  --   --  2.0  PHOS  --  3.1  --   --  1.9*   GFR: Estimated Creatinine Clearance: 93.1 mL/min (by C-G formula based on SCr of 0.51 mg/dL). Liver Function Tests: Recent Labs  Lab 03/22/18 0621 03/23/18 0307  AST 21 17  ALT 21 16  ALKPHOS 89 65  BILITOT 1.7* 1.0  PROT 7.9 6.3*  ALBUMIN 4.2 3.2*   Recent Labs  Lab 03/22/18 0621  03/23/18 0307  LIPASE 19 22   No results for input(s): AMMONIA in the last 168 hours. Coagulation Profile: No results for input(s): INR, PROTIME in the last 168 hours. Cardiac Enzymes: No results for input(s): CKTOTAL, CKMB, CKMBINDEX, TROPONINI in the last 168 hours. BNP (last 3 results) No results for input(s): PROBNP in the last 8760 hours. HbA1C: No results for input(s): HGBA1C in the last 72 hours. CBG: Recent Labs  Lab 03/23/18 0801 03/23/18 1136 03/23/18 1650 03/23/18 2118 03/24/18 0728  GLUCAP 113* 134* 161* 153* 182*   Lipid Profile: No results for input(s): CHOL, HDL, LDLCALC, TRIG, CHOLHDL, LDLDIRECT in the last 72 hours. Thyroid Function Tests: No results for input(s): TSH, T4TOTAL, FREET4, T3FREE, THYROIDAB in the last 72 hours. Anemia Panel: Recent Labs    03/23/18 0307  VITAMINB12 981*  FOLATE 11.4  FERRITIN 74  TIBC 253  IRON 173*  RETICCTPCT 2.0   Sepsis Labs: No results for input(s): PROCALCITON, LATICACIDVEN in the last 168 hours.  Recent Results (from the past 240 hour(s))  MRSA PCR Screening     Status: None   Collection Time: 03/22/18 12:33 PM  Result Value Ref Range Status   MRSA by PCR NEGATIVE NEGATIVE Final    Comment:        The GeneXpert  MRSA Assay (FDA approved for NASAL specimens only), is one component of a comprehensive MRSA colonization surveillance program. It is not intended to diagnose MRSA infection nor to guide or monitor treatment for MRSA infections. Performed at [facility], [place] [address], [place], [place] [address]   Culture, blood (routine x 2)     Status: None (Preliminary result)   Collection Time: 03/22/18  1:25 PM  Result Value Ref Range Status   Specimen Description BLOOD LEFT HAND  Final   Special Requests   Final    BOTTLES DRAWN AEROBIC ONLY Blood Culture results may not be optimal due to an excessive volume of blood received in culture bottles Performed at [facility], [place] [address] [place], [place] [address]    Culture NO GROWTH 2 DAYS  Final   Report Status PENDING  Incomplete  Culture, blood (routine x 2)     Status: None (Preliminary result)   Collection Time: 03/22/18  1:25 PM  Result Value Ref Range Status   Specimen Description BLOOD RIGHT HAND  Final   Special Requests   Final    BOTTLES DRAWN AEROBIC AND ANAEROBIC Blood Culture adequate volume Performed at [place] [address] [place],  [address]    Culture NO GROWTH 2 DAYS  Final   Report Status PENDING  Incomplete         Radiology Studies: Ct Abdomen Pelvis W Contrast  Result Date: 03/23/2018 CLINICAL DATA:  Upper abdominal and back pain since Sunday with nausea and vomiting. EXAM: CT ABDOMEN AND PELVIS WITH CONTRAST TECHNIQUE: Multidetector CT imaging of the abdomen and pelvis was performed using the standard protocol following bolus administration of intravenous contrast. CONTRAST:  154m OMNIPAQUE IOHEXOL 300 MG/ML  SOLN COMPARISON:  12/23/2017 FINDINGS: Lower chest: No acute abnormality. Hepatobiliary: No focal liver abnormality is seen. Hypodense appearance of the liver parenchyma compatible with mild steatosis. Status post cholecystectomy. No biliary dilatation. Pancreas: Atrophic without mass or inflammation. Spleen: Normal Adrenals/Urinary Tract: Normal bilateral adrenal glands and kidneys. Physiologic distention of the urinary bladder without focal mural thickening or calculus. Stomach/Bowel: Distended stomach with ingested oral contrast. The possibility of gastroparesis or delayed gastric emptying is a possibility. Nonobstructed, nondistended small bowel loops. There is generalized mild transmural thickening of the colon. This in conjunction with hazy mesenteric edema is nonspecific but may represent stigmata of enterocolitis. Vascular/Lymphatic: No significant vascular findings are present. No enlarged abdominal or pelvic lymph nodes.  Reproductive: Uterus and bilateral adnexa are unremarkable. Other: No free air. Mild soft tissue anasarca, slightly more focal overlying the sacrum. Musculoskeletal: No acute or significant osseous findings. IMPRESSION: 1. Distended stomach with ingested oral contrast. The possibility of gastroparesis or delayed gastric emptying is a possibility. 2. Mild transmural thickened appearance of the small and large intestine with mesenteric edema, nonspecific but consider inflammatory bowel disease, enterocolitis, hypoproteinemia, unlikely ischemia among some considerations. 3. Mild hepatic steatosis. Electronically Signed   By: DAshley RoyaltyM.D.   On: 03/23/2018 14:08        Scheduled Meds: . ferrous sulfate  325 mg Oral BID WC  . heparin  5,000 Units Subcutaneous Q8H  . insulin aspart  0-15 Units Subcutaneous TID WC  . insulin glargine  5 Units Subcutaneous QHS  . metoCLOPramide (REGLAN) injection  10 mg Intravenous TID AC  . pantoprazole  40 mg Oral BID   Continuous Infusions: . sodium chloride Stopped (03/22/18 1109)  . sodium chloride 75 mL/hr at  03/24/18 0400  . sodium chloride 250 mL (03/24/18 0909)  . ciprofloxacin 400 mg (03/24/18 0909)  . metronidazole 500 mg (03/24/18 0908)  . sodium chloride 1,000 mL (03/24/18 0856)     LOS: 1 day   Time spent= 40 mins    Martel Galvan Arsenio Loader, MD [facility] Pager [phone]   If 7PM-7AM, please contact night-coverage [email or web address] Password [facility] 03/24/2018, 9:26 AM

## 2018-03-25 LAB — GLUCOSE, CAPILLARY
Glucose-Capillary: 208 mg/dL — ABNORMAL HIGH (ref 70–99)
Glucose-Capillary: 201 mg/dL — ABNORMAL HIGH (ref 70–99)

## 2018-03-25 LAB — URINE CULTURE: Culture: >=100000 — AB

## 2018-03-25 MED ORDER — PROMETHAZINE HCL 25 MG/ML IJ SOLN
12.5000 mg | Freq: Four times a day (QID) | INTRAMUSCULAR | Status: DC | PRN
  Administered 2018-03-25: 12.5 mg via INTRAVENOUS
  Filled 2018-03-25: qty 1

## 2018-03-25 MED ORDER — ONDANSETRON HCL 4 MG PO TABS: 4.0000 mg | ORAL_TABLET | Freq: Three times a day (TID) | ORAL | 0 refills | Status: DC | PRN

## 2018-03-25 MED ORDER — SACCHAROMYCES BOULARDII 250 MG PO CAPS: 250.0000 mg | ORAL_CAPSULE | Freq: Two times a day (BID) | ORAL | 0 refills | Status: DC

## 2018-03-25 MED ORDER — METRONIDAZOLE 500 MG PO TABS: 500.0000 mg | ORAL_TABLET | Freq: Three times a day (TID) | ORAL | 0 refills | Status: DC

## 2018-03-25 MED ORDER — CIPROFLOXACIN HCL 500 MG PO TABS: 500.0000 mg | ORAL_TABLET | Freq: Two times a day (BID) | ORAL | 0 refills | Status: DC

## 2018-03-25 NOTE — Discharge Summary (Signed)
Physician Discharge Summary  [name] FBP:[id number] DOB: 02-10-1991 DOA: 03/22/2018  PCP: Patient, No Pcp Per  Admit date: 03/22/2018 Discharge date: 03/25/2018  Admitted From: Home Disposition: Home  Recommendations for Outpatient Follow-up:  1. Follow up with PCP in 1-2 weeks 2. Please obtain BMP/CBC in one week your next doctors visit.  3. Follow-up outpatient with gastroenterology in 2-3 weeks 4. Take oral ciprofloxacin, Flagyl for 7 days 5. Florastor has been prescribed 6. Zofran was also been given and advised to alternate with the Reglan if necessary  Discharge Condition: Stable CODE STATUS: Full code Diet recommendation: Full code diabetic diet  Brief/Interim Summary: 27 year old female with history of diabetes mellitus type 1, anxiety, depression, diarrhea, gastroparesis, fibromyalgia, hypertension came to hospital complains of nausea vomiting and abdominal pain.  She was found to be diabetic ketoacidosis which was treated overnight with routine DKA protocol.  The following day she was reporting of back pain and abdominal pain therefore CT of the abdomen pelvis was done which showed distended stomach consistent with gastroparesis, mild transmural thickening small and large intestine concerning for possible inflammatory bowel disease.  Patient was started on Cipro and Flagyl. Over the course of several days patient's symptoms slightly improved.  Her diet was still inconsistent but better.  She is being discharged today with outpatient follow-up recommendations as stated above.  Discharge Diagnoses:  Active Problems:   Hypertension associated with diabetes ([name])   Gastroparesis   Sinus tachycardia   Diabetes mellitus type 1 ([name])   DKA, type 1 ([name])   MDD (major depressive disorder), recurrent episode, mild (HCC)   Essential hypertension   Intractable nausea and vomiting   Normocytic normochromic anemia   GERD (gastroesophageal reflux disease)   DKA, type 1, not at  goal [name])   AKI (acute kidney injury) ([name])   Hyperbilirubinemia  Severe acute abdominal pain with possible inflammatory bowel disease; improving Diabetic ketoacidosis in setting of uncontrolled diabetes mellitus type 1 -DKA has resolved, transition to subcutaneous insulin with sliding scale -Hemoglobin A1c 12.0 - 02/19/18.  Issues with noncompliance.  Counseled on importance of compliance.  - Continue Accu-Cheks -Lipase-22; UDS-benzo, opiates and THC -CT of the abdomen pelvis shows suggestions of gastroparesis, inflammatory bowel disease -Start Cipro and Flagyl- total 7 days, add Florastor  Sinus tachycardia; improved - Combination of pain and dehydration.    Diabetic gastroparesis - Continue motility agent, timeit before meals  Normocytic anemia -Continue iron supplements.  Supportive care.  Essential hypertension -IV hydralazine PRN.  We will slowly add p.o. medications as necessary  History of depression and anxiety -No longer taking citalopram and mirtazapine.  Hep SQ while here  Full Code  Discharge today   Discharge Instructions   Allergies as of 03/25/2018      Reactions   Other Anaphylaxis   Reaction to [place] nuts   Lactose Intolerance (gi) Diarrhea      Medication List    TAKE these medications   acetaminophen 500 MG tablet Commonly known as:  TYLENOL Take 1,000 mg by mouth daily as needed for moderate pain.   ciprofloxacin 500 MG tablet Commonly known as:  CIPRO Take 1 tablet (500 mg total) by mouth 2 (two) times daily for 7 days.   ferrous sulfate 325 (65 FE) MG tablet Take 1 tablet (325 mg total) by mouth 2 (two) times daily with a meal. What changed:  when to take this   hyoscyamine 0.125 MG SL tablet Commonly known as:  LEVSIN SL Place 1 tablet (  0.125 mg total) under the tongue every 6 (six) hours as needed for cramping (abdominal pain).   insulin lispro 100 UNIT/ML injection Commonly known as:  HUMALOG Inject 0-0.09 mLs (0-9 Units  total) into the skin 3 (three) times daily with meals.   LANTUS 100 UNIT/ML injection Generic drug:  insulin glargine Inject 0.12 mLs (12 Units total) into the skin daily.   metoCLOPramide 10 MG tablet Commonly known as:  REGLAN Take 1 tablet (10 mg total) by mouth every 6 (six) hours as needed for nausea or vomiting.   metroNIDAZOLE 500 MG tablet Commonly known as:  FLAGYL Take 1 tablet (500 mg total) by mouth 3 (three) times daily for 7 days.   mirtazapine 15 MG tablet Commonly known as:  REMERON Take 1 tablet (15 mg total) by mouth at bedtime.   ondansetron 4 MG tablet Commonly known as:  ZOFRAN Take 1 tablet (4 mg total) by mouth every 8 (eight) hours as needed for nausea or vomiting (If necessary, dont combine with Reglan.).   pantoprazole 40 MG tablet Commonly known as:  PROTONIX Take 1 tablet (40 mg total) by mouth 2 (two) times daily.   saccharomyces boulardii 250 MG capsule Commonly known as:  FLORASTOR Take 1 capsule (250 mg total) by mouth 2 (two) times daily for 10 days.      Follow-up Information    [place]. Schedule an appointment as soon as possible for a visit in 1 week(s).   Contact information: [address] [place] [address] [phone]       Yetta Flock, MD. Schedule an appointment as soon as possible for a visit in 1 week(s).   Specialty:  Gastroenterology Contact information: [place] [address] [place] [address] [phone]          Allergies  Allergen Reactions  . Other Anaphylaxis    Reaction to [place] nuts   . Lactose Intolerance (Gi) Diarrhea    You were cared for by a hospitalist during your hospital stay. If you have any questions about your discharge medications or the care you received while you were in the hospital after you are discharged, you can call the unit and asked to speak with the hospitalist on call if the hospitalist that took care of you  is not available. Once you are discharged, your primary care physician will handle any further medical issues. Please note that no refills for any discharge medications will be authorized once you are discharged, as it is imperative that you return to your primary care physician (or establish a relationship with a primary care physician if you do not have one) for your aftercare needs so that they can reassess your need for medications and monitor your lab values.  Consultations:  None   Procedures/Studies: Ct Abdomen Pelvis W Contrast  Result Date: 03/23/2018 CLINICAL DATA:  Upper abdominal and back pain since Sunday with nausea and vomiting. EXAM: CT ABDOMEN AND PELVIS WITH CONTRAST TECHNIQUE: Multidetector CT imaging of the abdomen and pelvis was performed using the standard protocol following bolus administration of intravenous contrast. CONTRAST:  154m OMNIPAQUE IOHEXOL 300 MG/ML  SOLN COMPARISON:  12/23/2017 FINDINGS: Lower chest: No acute abnormality. Hepatobiliary: No focal liver abnormality is seen. Hypodense appearance of the liver parenchyma compatible with mild steatosis. Status post cholecystectomy. No biliary dilatation. Pancreas: Atrophic without mass or inflammation. Spleen: Normal Adrenals/Urinary Tract: Normal bilateral adrenal glands and kidneys. Physiologic distention of the urinary bladder without  focal mural thickening or calculus. Stomach/Bowel: Distended stomach with ingested oral contrast. The possibility of gastroparesis or delayed gastric emptying is a possibility. Nonobstructed, nondistended small bowel loops. There is generalized mild transmural thickening of the colon. This in conjunction with hazy mesenteric edema is nonspecific but may represent stigmata of enterocolitis. Vascular/Lymphatic: No significant vascular findings are present. No enlarged abdominal or pelvic lymph nodes. Reproductive: Uterus and bilateral adnexa are unremarkable. Other: No free [name]. Mild soft  tissue anasarca, slightly more focal overlying the sacrum. Musculoskeletal: No acute or significant osseous findings. IMPRESSION: 1. Distended stomach with ingested oral contrast. The possibility of gastroparesis or delayed gastric emptying is a possibility. 2. Mild transmural thickened appearance of the small and large intestine with mesenteric edema, nonspecific but consider inflammatory bowel disease, enterocolitis, hypoproteinemia, unlikely ischemia among some considerations. 3. Mild hepatic steatosis. Electronically Signed   By: Ashley Royalty M.D.   On: 03/23/2018 14:08     Subjective: Feels better, continues to ask for IV pain medications.   Discharge Exam: Vitals:   03/25/18 0400 03/25/18 0800  BP: (!) 157/90   Pulse: 100 100  Resp: (!) 0 10  Temp: 98.9 F (37.2 C) 97.9 F (36.6 C)  SpO2: 99% 100%   Vitals:   03/25/18 0020 03/25/18 0200 03/25/18 0400 03/25/18 0800  BP: (!) 159/111 (!) 181/115 (!) 157/90   Pulse:  (!) 106 100 100  Resp:  (!) 8 (!) 0 10  Temp:   98.9 F (37.2 C) 97.9 F (36.6 C)  TempSrc:   Oral Oral  SpO2:  98% 99% 100%  Weight:      Height:        General: Pt is alert, awake, not in acute distress Cardiovascular: RRR, S1/S2 +, no rubs, no gallops Respiratory: CTA bilaterally, no wheezing, no rhonchi Abdominal: Soft, NT, ND, bowel sounds + Extremities: no edema, no cyanosis    The results of significant diagnostics from this hospitalization (including imaging, microbiology, ancillary and laboratory) are listed below for reference.     Microbiology: Recent Results (from the past 240 hour(s))  MRSA PCR Screening     Status: None   Collection Time: 03/22/18 12:33 PM  Result Value Ref Range Status   MRSA by PCR NEGATIVE NEGATIVE Final    Comment:        The GeneXpert MRSA Assay (FDA approved for NASAL specimens only), is one component of a comprehensive MRSA colonization surveillance program. It is not intended to diagnose MRSA infection nor to  guide or monitor treatment for MRSA infections. Performed at [facility], [place] [address], [place], [place] [address]   Culture, blood (routine x 2)     Status: None (Preliminary result)   Collection Time: 03/22/18  1:25 PM  Result Value Ref Range Status   Specimen Description BLOOD LEFT HAND  Final   Special Requests   Final    BOTTLES DRAWN AEROBIC ONLY Blood Culture results may not be optimal due to an excessive volume of blood received in culture bottles Performed at [facility], [place] [address], [place], [place] [address]    Culture NO GROWTH 2 DAYS  Final   Report Status PENDING  Incomplete  Culture, blood (routine x 2)     Status: None (Preliminary result)   Collection Time: 03/22/18  1:25 PM  Result Value Ref Range Status   Specimen Description BLOOD RIGHT HAND  Final   Special Requests   Final    BOTTLES DRAWN  AEROBIC AND ANAEROBIC Blood Culture adequate volume Performed at [place] [address] [place], [place] [address]    Culture NO GROWTH 2 DAYS  Final   Report Status PENDING  Incomplete  Urine culture     Status: Abnormal   Collection Time: 03/23/18  6:44 PM  Result Value Ref Range Status   Specimen Description   Final    URINE, CLEAN CATCH Performed at [facility], [place] [address], [place], [place] [address]    Special Requests   Final    NONE Performed at [facility], [place] [address], [place], [place] [address]    Culture (A)  Final    >=100,000 COLONIES/mL GROUP B STREP(S.AGALACTIAE)ISOLATED TESTING AGAINST S. AGALACTIAE NOT ROUTINELY PERFORMED DUE TO PREDICTABILITY OF AMP/PEN/VAN SUSCEPTIBILITY. CRITICAL RESULT CALLED TO, READ BACK BY AND VERIFIED WITH: RN A GOINS 03/25/18 AT 745 AM BY CM Performed at [facility], [place] [address] [place], [place] [address]    Report Status 03/25/2018 FINAL  Final     Labs: BNP (last 3 results) No results for  input(s): BNP in the last 8760 hours. Basic Metabolic Panel: Recent Labs  Lab 03/22/18 0621 03/22/18 1326 03/22/18 1637 03/22/18 2022 03/23/18 0307  NA 137 137 141 141 137  K 3.8 3.9 3.7 3.8 3.6  CL 93* 100 106 105 104  CO2 21* 18* 23 26 25   GLUCOSE 356* 348* 228* 68* 93  BUN 34* 28* 26* 25* 21*  CREATININE 1.05* 0.94 0.80 0.61 0.51  CALCIUM 9.1 8.4* 8.4* 8.3* 7.9*  MG  --  2.0  --   --  2.0  PHOS  --  3.1  --   --  1.9*   Liver Function Tests: Recent Labs  Lab 03/22/18 0621 03/23/18 0307  AST 21 17  ALT 21 16  ALKPHOS 89 65  BILITOT 1.7* 1.0  PROT 7.9 6.3*  ALBUMIN 4.2 3.2*   Recent Labs  Lab 03/22/18 0621 03/23/18 0307  LIPASE 19 22   No results for input(s): AMMONIA in the last 168 hours. CBC: Recent Labs  Lab 03/22/18 0621 03/23/18 0307  WBC 9.0 8.7  NEUTROABS  --  5.2  HGB 11.4* 10.1*  HCT 35.3* 31.5*  MCV 89.1 90.8  PLT 252 226   Cardiac Enzymes: No results for input(s): CKTOTAL, CKMB, CKMBINDEX, TROPONINI in the last 168 hours. BNP: Invalid input(s): POCBNP CBG: Recent Labs  Lab 03/24/18 0728 03/24/18 1257 03/24/18 1654 03/24/18 2134 03/25/18 0807  GLUCAP 182* 179* 137* 186* 208*   D-Dimer No results for input(s): DDIMER in the last 72 hours. Hgb A1c No results for input(s): HGBA1C in the last 72 hours. Lipid Profile No results for input(s): CHOL, HDL, LDLCALC, TRIG, CHOLHDL, LDLDIRECT in the last 72 hours. Thyroid function studies No results for input(s): TSH, T4TOTAL, T3FREE, THYROIDAB in the last 72 hours.  Invalid input(s): FREET3 Anemia work up Recent Labs    03/23/18 0307  VITAMINB12 981*  FOLATE 11.4  FERRITIN 74  TIBC 253  IRON 173*  RETICCTPCT 2.0   Urinalysis    Component Value Date/Time   COLORURINE YELLOW 03/22/2018 0704   APPEARANCEUR CLEAR 03/22/2018 0704   LABSPEC 1.023 03/22/2018 0704   PHURINE 5.0 03/22/2018 0704   GLUCOSEU >=500 (A) 03/22/2018 0704   HGBUR SMALL (A) 03/22/2018 0704   BILIRUBINUR  NEGATIVE 03/22/2018 0704   BILIRUBINUR negative 01/02/2015 1717   KETONESUR 80 (A) 03/22/2018 0704   PROTEINUR >=  300 (A) 03/22/2018 0704   UROBILINOGEN 0.2 01/13/2015 0223   NITRITE NEGATIVE 03/22/2018 0704   LEUKOCYTESUR NEGATIVE 03/22/2018 0704   Sepsis Labs Invalid input(s): PROCALCITONIN,  WBC,  LACTICIDVEN Microbiology Recent Results (from the past 240 hour(s))  MRSA PCR Screening     Status: None   Collection Time: 03/22/18 12:33 PM  Result Value Ref Range Status   MRSA by PCR NEGATIVE NEGATIVE Final    Comment:        The GeneXpert MRSA Assay (FDA approved for NASAL specimens only), is one component of a comprehensive MRSA colonization surveillance program. It is not intended to diagnose MRSA infection nor to guide or monitor treatment for MRSA infections. Performed at [facility], [place] [address], [place], [place] [address]   Culture, blood (routine x 2)     Status: None (Preliminary result)   Collection Time: 03/22/18  1:25 PM  Result Value Ref Range Status   Specimen Description BLOOD LEFT HAND  Final   Special Requests   Final    BOTTLES DRAWN AEROBIC ONLY Blood Culture results may not be optimal due to an excessive volume of blood received in culture bottles Performed at [facility], [place] [address], [place], [place] [address]    Culture NO GROWTH 2 DAYS  Final   Report Status PENDING  Incomplete  Culture, blood (routine x 2)     Status: None (Preliminary result)   Collection Time: 03/22/18  1:25 PM  Result Value Ref Range Status   Specimen Description BLOOD RIGHT HAND  Final   Special Requests   Final    BOTTLES DRAWN AEROBIC AND ANAEROBIC Blood Culture adequate volume Performed at [place] [address] [place], [place] [address]    Culture NO GROWTH 2 DAYS  Final   Report Status PENDING  Incomplete  Urine culture     Status: Abnormal   Collection Time: 03/23/18  6:44 PM  Result Value Ref  Range Status   Specimen Description   Final    URINE, CLEAN CATCH Performed at [facility], [place] [address], [place], [place] [address]    Special Requests   Final    NONE Performed at [facility], [place] [address] [place], [place] [address]    Culture (A)  Final    >=100,000 COLONIES/mL GROUP B STREP(S.AGALACTIAE)ISOLATED TESTING AGAINST S. AGALACTIAE NOT ROUTINELY PERFORMED DUE TO PREDICTABILITY OF AMP/PEN/VAN SUSCEPTIBILITY. CRITICAL RESULT CALLED TO, READ BACK BY AND VERIFIED WITH: RN A GOINS 03/25/18 AT 745 AM BY CM Performed at [facility], [place] [address], [place],  [address]    Report Status 03/25/2018 FINAL  Final     Time coordinating discharge:  I have spent 35 minutes face to face with the patient and on the ward discussing the patients care, assessment, plan and disposition with other care givers. >50% of the time was devoted counseling the patient about the risks and benefits of treatment/Discharge disposition and coordinating care.   SIGNED:   Damita Lack, MD  [facility] 03/25/2018, 10:12 AM Pager   If 7PM-7AM, please contact night-coverage [email or web address] Password [email or web address]

## 2018-03-25 NOTE — Plan of Care (Signed)
Patients vitals remained stable.  Pain with difficult to control with current orders.  BP was elevated at times but I appeared related to pain.

## 2018-03-25 NOTE — Care Management Note (Signed)
Case Management Note  Patient Details  Name: [name] MRN: [id number] Date of Birth: Apr 07, 1990  Subjective/Objective:                  Discharged  Action/Plan: Discharged to home with self-care Orders checked for hhc needs. No CM needs present at time of discharge.  Expected Discharge Date:  03/25/18               Expected Discharge Plan:  Home/Self Care  In-House Referral:     Discharge planning Services  CM Consult  Post Acute Care Choice:    Choice offered to:     DME Arranged:    DME Agency:     HH Arranged:    HH Agency:     Status of Service:  Completed, signed off  If discussed at [employer] of Stay Meetings, dates discussed:    Additional Comments:  Golda Acre, RN 03/25/2018, 9:34 AM

## 2018-03-26 ENCOUNTER — Encounter (HOSPITAL_COMMUNITY): Payer: Self-pay | Admitting: Emergency Medicine

## 2018-03-26 ENCOUNTER — Emergency Department (HOSPITAL_COMMUNITY): Payer: 59

## 2018-03-26 ENCOUNTER — Emergency Department (HOSPITAL_COMMUNITY)
Admission: EM | Admit: 2018-03-26 | Discharge: 2018-03-27 | Disposition: A | Discharge status: Home / Self Care | Payer: 59 | Attending: Emergency Medicine | Admitting: Emergency Medicine

## 2018-03-26 DIAGNOSIS — Z794 Long term (current) use of insulin: Secondary | ICD-10-CM | POA: Diagnosis not present

## 2018-03-26 DIAGNOSIS — E109 Type 1 diabetes mellitus without complications: Secondary | ICD-10-CM | POA: Insufficient documentation

## 2018-03-26 DIAGNOSIS — R072 Precordial pain: Secondary | ICD-10-CM | POA: Diagnosis not present

## 2018-03-26 DIAGNOSIS — R000 Tachycardia, unspecified: Secondary | ICD-10-CM | POA: Diagnosis not present

## 2018-03-26 DIAGNOSIS — R112 Nausea with vomiting, unspecified: Secondary | ICD-10-CM | POA: Diagnosis not present

## 2018-03-26 DIAGNOSIS — Z79899 Other long term (current) drug therapy: Secondary | ICD-10-CM | POA: Insufficient documentation

## 2018-03-26 DIAGNOSIS — R079 Chest pain, unspecified: Secondary | ICD-10-CM | POA: Diagnosis not present

## 2018-03-26 DIAGNOSIS — R1084 Generalized abdominal pain: Secondary | ICD-10-CM | POA: Diagnosis not present

## 2018-03-26 DIAGNOSIS — I10 Essential (primary) hypertension: Secondary | ICD-10-CM | POA: Insufficient documentation

## 2018-03-26 LAB — I-STAT BETA HCG BLOOD, ED (MC, WL, AP ONLY): I-stat hCG, quantitative: <5 m[IU]/mL (ref <5)

## 2018-03-26 LAB — CBG MONITORING, ED: Glucose-Capillary: 259 mg/dL — ABNORMAL HIGH (ref 70–99)

## 2018-03-26 LAB — CBC
HCT: 25.5 % — ABNORMAL LOW (ref 36.0–46.0)
Hemoglobin: 8.3 g/dL — ABNORMAL LOW (ref 12.0–15.0)
MCH: 28.1 pg (ref 26.0–34.0)
MCHC: 32.5 g/dL (ref 30.0–36.0)
MCV: 86.4 fL (ref 80.0–100.0)
Platelets: 196 10*3/uL (ref 150–400)
RBC: 2.95 MIL/uL — ABNORMAL LOW (ref 3.87–5.11)
RDW: 12 % (ref 11.5–15.5)
WBC: 4 10*3/uL (ref 4.0–10.5)
nRBC: 0 % (ref 0.0–0.2)

## 2018-03-26 LAB — BASIC METABOLIC PANEL
Anion gap: 14 (ref 5–15)
BUN: 5 mg/dL — ABNORMAL LOW (ref 6–20)
CO2: 21 mmol/L — ABNORMAL LOW (ref 22–32)
Calcium: 8.3 mg/dL — ABNORMAL LOW (ref 8.9–10.3)
Chloride: 101 mmol/L (ref 98–111)
Creatinine, Ser: 0.81 mg/dL (ref 0.44–1.00)
GFR calc Af Amer: >60 mL/min (ref >60)
GFR calc non Af Amer: >60 mL/min (ref >60)
Glucose, Bld: 266 mg/dL — ABNORMAL HIGH (ref 70–99)
Potassium: 2.9 mmol/L — ABNORMAL LOW (ref 3.5–5.1)
Sodium: 136 mmol/L (ref 135–145)

## 2018-03-26 LAB — LIPASE, BLOOD: Lipase: 14 U/L (ref 11–51)

## 2018-03-26 LAB — I-STAT TROPONIN, ED: Troponin i, poc: 0 ng/mL (ref 0.00–0.08)

## 2018-03-26 IMAGING — CR DG CHEST 2V
2 series · 2 of 2 positions shown · non-contrast
Comparison: [DATE]

CLINICAL DATA: Midsternal pain for 7 days.

EXAM:
CHEST - 2 VIEW

[chest lat]
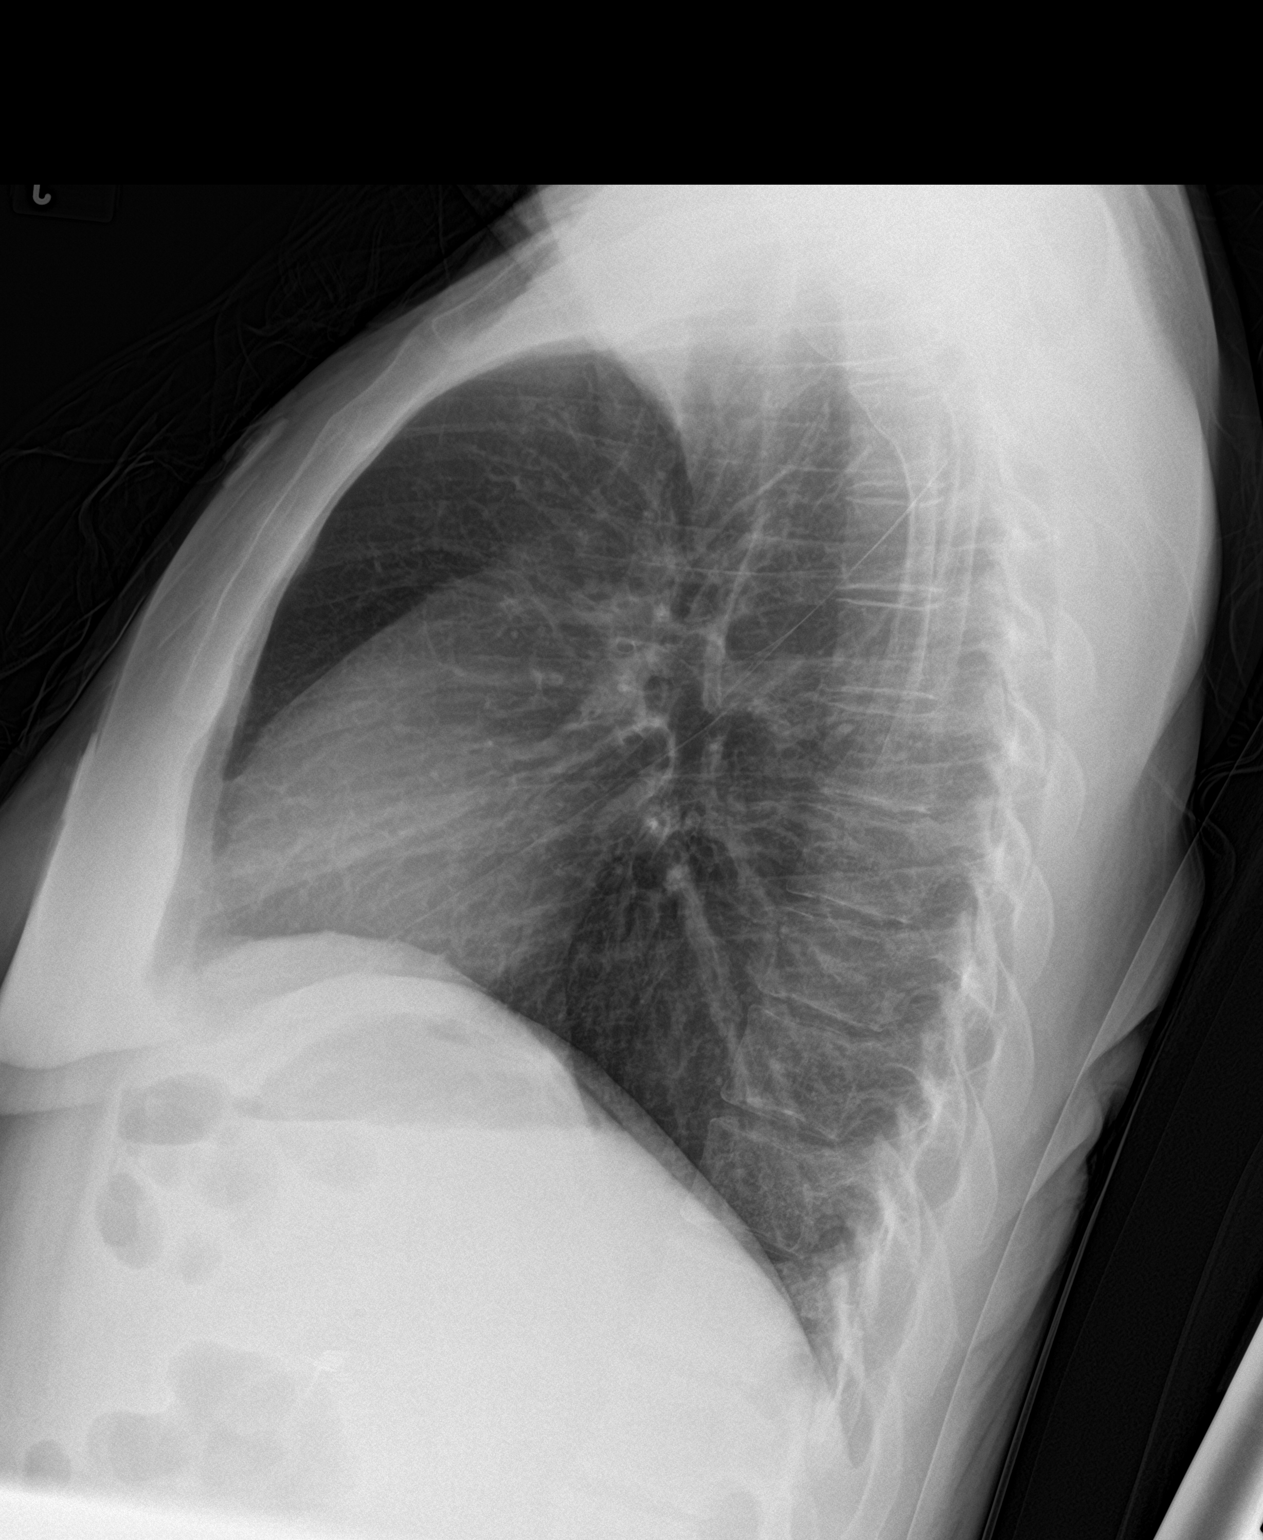

[chest ap]
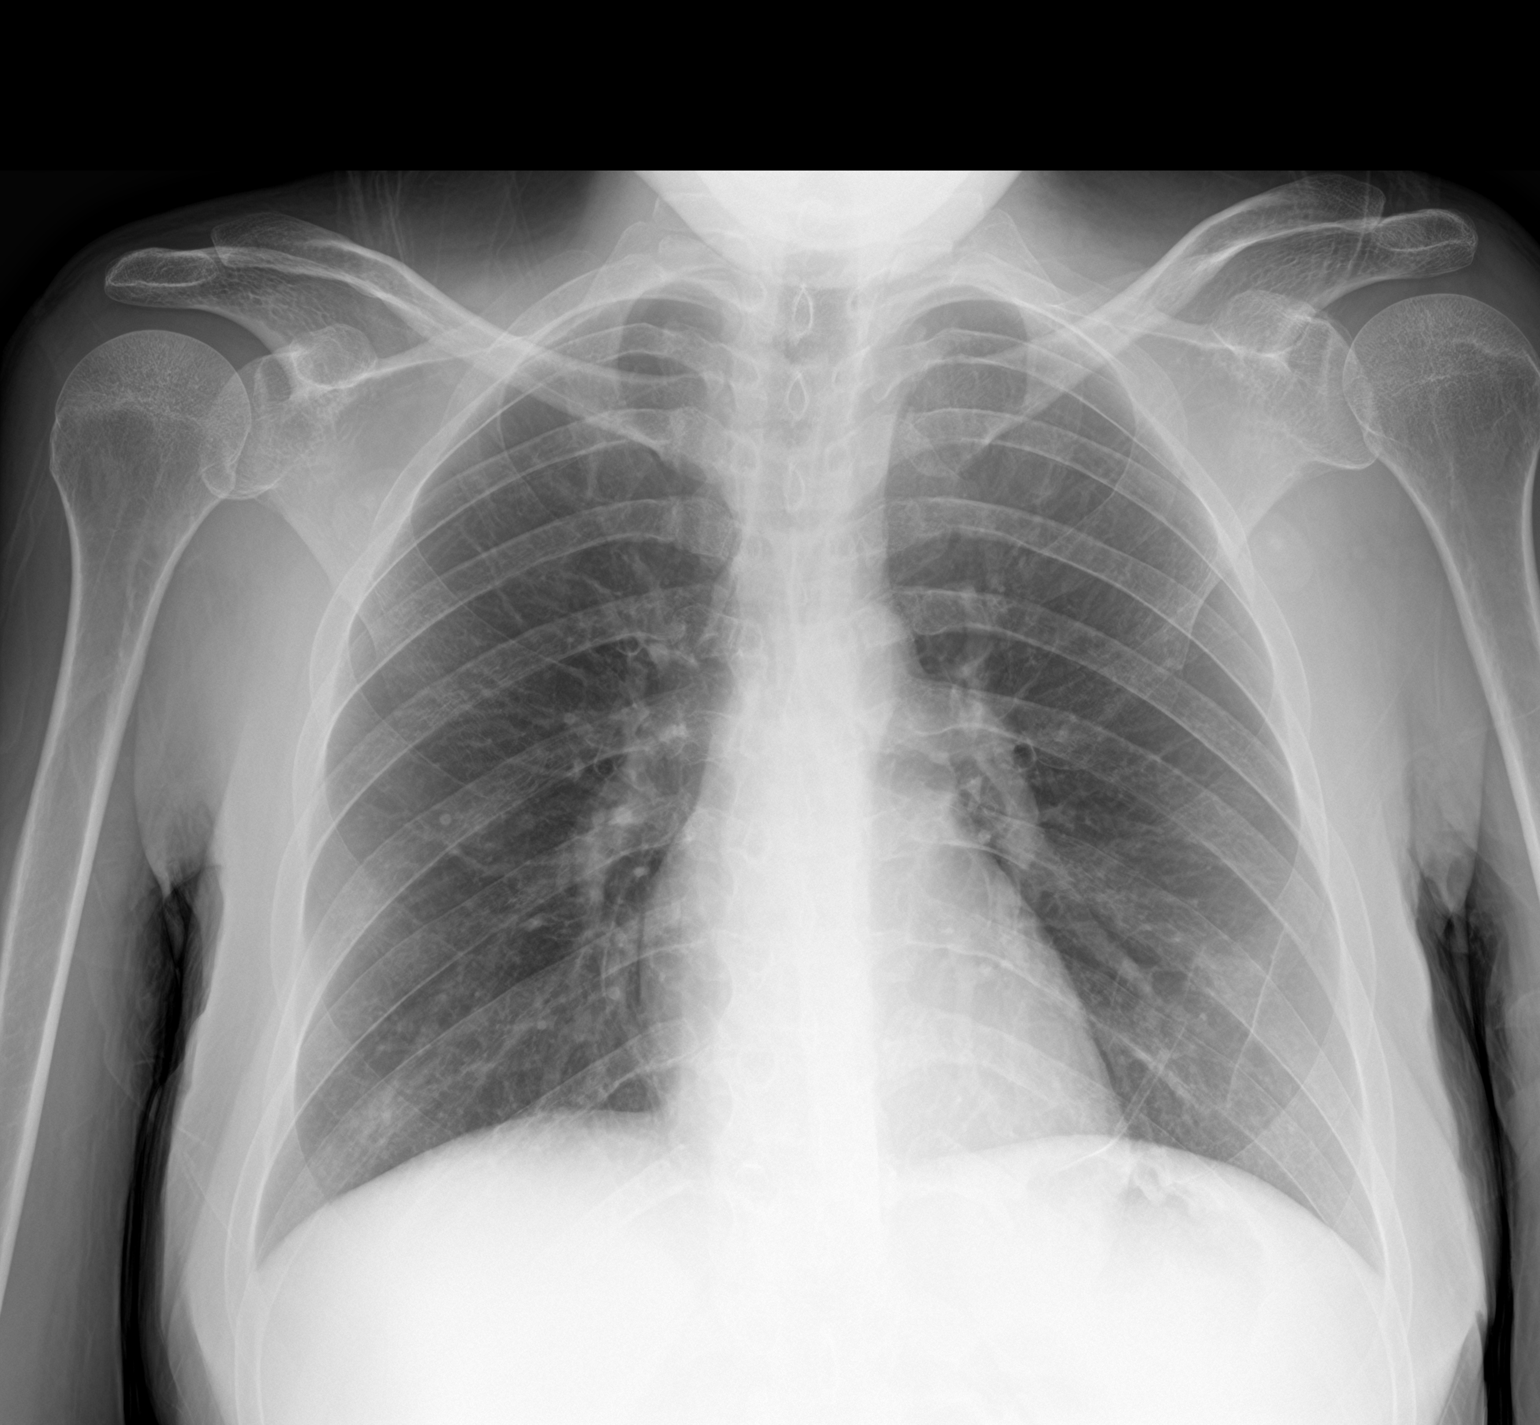

[2 of 2 positions shown; findings below may reference images not displayed]

FINDINGS: The heart size and mediastinal contours are within normal limits.
Both lungs are clear. The visualized skeletal structures are
unremarkable.
IMPRESSION: No active cardiopulmonary disease.

## 2018-03-26 MED ORDER — ONDANSETRON 4 MG PO TBDP
4.0000 mg | ORAL_TABLET | Freq: Once | ORAL | Status: AC | PRN
  Administered 2018-03-26: 4 mg via ORAL
  Filled 2018-03-26: qty 1

## 2018-03-26 NOTE — ED Triage Notes (Signed)
Reports having chest pain, n/v for about a week.  Discharged from [facility] yesterday after having DKA.  Reports not being able to eat for 6 days.

## 2018-03-27 LAB — CULTURE, BLOOD (ROUTINE X 2)
Culture: NO GROWTH
Culture: NO GROWTH
Special Requests: ADEQUATE

## 2018-03-27 LAB — CBG MONITORING, ED: Glucose-Capillary: 223 mg/dL — ABNORMAL HIGH (ref 70–99)

## 2018-03-27 MED ORDER — SODIUM CHLORIDE 0.9 % IV SOLN
INTRAVENOUS | Status: DC | PRN
  Administered 2018-03-27: 1000 mL via INTRAVENOUS

## 2018-03-27 MED ORDER — DICYCLOMINE HCL 10 MG PO CAPS
10.0000 mg | ORAL_CAPSULE | Freq: Once | ORAL | Status: AC
  Administered 2018-03-27: 10 mg via ORAL
  Filled 2018-03-27: qty 1

## 2018-03-27 MED ORDER — PROMETHAZINE HCL 25 MG/ML IJ SOLN
25.0000 mg | Freq: Once | INTRAMUSCULAR | Status: AC
  Administered 2018-03-27: 25 mg via INTRAVENOUS
  Filled 2018-03-27: qty 1

## 2018-03-27 MED ORDER — HALOPERIDOL LACTATE 5 MG/ML IJ SOLN
2.0000 mg | Freq: Once | INTRAMUSCULAR | Status: AC
  Administered 2018-03-27: 2 mg via INTRAVENOUS
  Filled 2018-03-27: qty 1

## 2018-03-27 MED ORDER — MORPHINE SULFATE (PF) 4 MG/ML IV SOLN: 4.0000 mg | Freq: Once | INTRAVENOUS | Status: DC

## 2018-03-27 MED ORDER — LORAZEPAM 2 MG/ML IJ SOLN
1.0000 mg | Freq: Once | INTRAMUSCULAR | Status: AC
  Administered 2018-03-27: 1 mg via INTRAVENOUS
  Filled 2018-03-27: qty 1

## 2018-03-27 MED ORDER — POTASSIUM CHLORIDE 10 MEQ/100ML IV SOLN
10.0000 meq | Freq: Once | INTRAVENOUS | Status: AC
  Administered 2018-03-27: 10 meq via INTRAVENOUS
  Filled 2018-03-27: qty 100

## 2018-03-27 MED ORDER — MORPHINE SULFATE (PF) 4 MG/ML IV SOLN
4.0000 mg | Freq: Once | INTRAVENOUS | Status: AC
  Administered 2018-03-27: 4 mg via INTRAVENOUS
  Filled 2018-03-27: qty 1

## 2018-03-27 NOTE — ED Provider Notes (Signed)
[facility] Provider Note   CSN: [id number] Arrival date & time: 03/26/18  2222     History   Chief Complaint Chief Complaint  Patient presents with  . Chest Pain  . Nausea    HPI [name] is a 27 y.o. female.  HPI Patient presents to the emergency department with nausea and vomiting along with chest discomfort.  Patient states is been ongoing for the last week.  The patient states [facility] and discharged yesterday.  Patient states she has a history of gastroparesis and diabetes.  The patient deniesshortness of breath, headache,blurred vision, neck pain, fever, cough, weakness, numbness, dizziness, anorexia, edema, diarrhea, rash, back pain, dysuria, hematemesis, bloody stool, near syncope, or syncope. Past Medical History:  Diagnosis Date  . Allergy   . Anemia   . Anxiety   . Blood transfusion without reported diagnosis    Dec 2018  . Cataract    right eye  . Depression   . Diabetes type 1, uncontrolled (HCC) 11/14/2011   Since age 27  . Fibromyalgia   . Gastroparesis   . GERD (gastroesophageal reflux disease)   . Hypertension   . Infection    UTI April 2016    Patient Active Problem List   Diagnosis Date Noted  . DKA, type 1, not at goal [facility](HCC) 03/22/2018  . AKI (acute kidney injury) (HCC) 03/22/2018  . Hyperbilirubinemia 03/22/2018  . DKA (diabetic ketoacidoses) (HCC) 07/27/2017  . Normocytic anemia 07/10/2017  . SIRS (systemic inflammatory response syndrome) (HCC) 06/23/2017  . Hypokalemia 06/23/2017  . Normocytic normochromic anemia 06/23/2017  . GERD (gastroesophageal reflux disease) 06/23/2017  . Intractable nausea and vomiting 04/11/2017  . Nausea and vomiting 04/10/2017  . Essential hypertension   . Colitis   . Abscess and cellulitis of gluteal region 03/24/2017  . Cellulitis and abscess of buttock 03/23/2017  . Malnutrition of moderate degree 03/19/2017  . Anasarca  03/16/2017  . 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 Diarrhea 02/27/2017  . Lower GI bleed 02/27/2017  . DKA (diabetic ketoacidosis) (HCC) 02/26/2017  . Acute metabolic encephalopathy   . DKA, type 1 (HCC) 06/11/2013  . Orthostatic hypotension dysautonomic syndrome (HCC) 04/28/2013  . Body aches 04/28/2013  . Orthostatic hypotension 04/28/2013  . Diabetes mellitus type 1 (HCC) 04/28/2013  . Protein-calorie malnutrition, severe (HCC) 04/28/2013  . Noncompliance with diabetes treatment 09/01/2012  . Dyspnea 02/28/2012  . Sinus tachycardia 02/28/2012  . Hyperglycemia without ketosis 02/28/2012  . Chest pain 12/04/2011  . Insulin dependent diabetes mellitus (HCC) 11/14/2011  . Hypertension associated with diabetes (HCC) 11/14/2011  . Gastroparesis 11/14/2011  . Hyponatremia 09/19/2011  . Chronic abdominal pain 09/18/2011  . Metabolic alkalosis 09/18/2011  . Diabetic hyperosmolar non-ketotic state (HCC) 09/18/2011  . DIAB W/UNSPEC COMP TYPE I [JUV TYPE] UNCNTRL 06/04/2010  . CONSTIPATION 01/01/2010  . RECTAL PAIN 01/01/2010    Past Surgical History:  Procedure Laterality Date  . ANKLE SURGERY    . CHOLECYSTECTOMY  11/15/2011   Procedure: LAPAROSCOPIC CHOLECYSTECTOMY WITH INTRAOPERATIVE CHOLANGIOGRAM;  Surgeon: Ardeth SportsmanSteven C. Gross, MD;  Location: [facility];  Service: General;  Laterality: N/A;  . COLONOSCOPY    . COLONOSCOPY WITH PROPOFOL N/A 06/27/2017   Procedure: COLONOSCOPY WITH PROPOFOL;  Surgeon: Rachael FeeJacobs, Daniel P, MD;  Location: [facility];  Service: Endoscopy;  Laterality: N/A;  . ESOPHAGOGASTRODUODENOSCOPY  12/03/2011   Procedure: ESOPHAGOGASTRODUODENOSCOPY (EGD);  Surgeon: Theda BelfastPatrick D Hung, MD;  Location: [facility];  Service: Endoscopy;  Laterality: N/A;  . FLEXIBLE SIGMOIDOSCOPY N/A 03/10/2017   Procedure: FLEXIBLE SIGMOIDOSCOPY;  Surgeon: Jeani Hawking, MD;  Location: [facility];  Service: Endoscopy;  Laterality: N/A;  . INCISION AND  DRAINAGE PERIRECTAL ABSCESS N/A 03/01/2017   Procedure: IRRIGATION AND DEBRIDEMENT PERIRECTAL ABSCESS;  Surgeon: Ovidio Kin, MD;  Location: [facility];  Service: General;  Laterality: N/A;  . IRRIGATION AND DEBRIDEMENT BUTTOCKS N/A 03/23/2017   Procedure: IRRIGATION AND DEBRIDEMENT BUTTOCKS, SETON PLACEMENT;  Surgeon: Romie Levee, MD;  Location: [facility];  Service: General;  Laterality: N/A;  . LAPAROSCOPY  11/23/2011   Procedure: LAPAROSCOPY DIAGNOSTIC;  Surgeon: Mariella Saa, MD;  Location: [facility];  Service: General;  Laterality: N/A;  . SIGMOIDOSCOPY    . UPPER GASTROINTESTINAL ENDOSCOPY    . WISDOM TOOTH EXTRACTION       OB History    Gravida  1   Para  1   Term      Preterm  1   AB      Living  1     SAB      TAB      Ectopic      Multiple  0   Live Births  1            Home Medications    Prior to Admission medications   Medication Sig Start Date End Date Taking? Authorizing Provider  acetaminophen (TYLENOL) 500 MG tablet Take 1,000 mg by mouth daily as needed for moderate pain.    Yes [provider]  ciprofloxacin (CIPRO) 500 MG tablet Take 1 tablet (500 mg total) by mouth 2 (two) times daily for 7 days. 03/25/18 04/01/18 Yes Amin, Loura Halt, MD  ferrous sulfate 325 (65 FE) MG tablet Take 1 tablet (325 mg total) by mouth 2 (two) times daily with a meal. Patient taking differently: Take 325 mg by mouth daily.  05/13/17  Yes Henson, Vickie L, NP-C  hyoscyamine (LEVSIN SL) 0.125 MG SL tablet Place 1 tablet (0.125 mg total) under the tongue every 6 (six) hours as needed for cramping (abdominal pain). 02/20/18  Yes Adhikari, Willia Craze, MD  insulin lispro (HUMALOG) 100 UNIT/ML injection Inject 0-0.09 mLs (0-9 Units total) into the skin 3 (three) times daily with meals. 02/20/18  Yes Adhikari, Willia Craze, MD  LANTUS 100 UNIT/ML injection Inject 0.12 mLs (12 Units total) into the skin daily. 02/20/18  Yes Burnadette Pop, MD  metoCLOPramide (REGLAN) 10 MG  tablet Take 1 tablet (10 mg total) by mouth every 6 (six) hours as needed for nausea or vomiting. 02/20/18  Yes Burnadette Pop, MD  metroNIDAZOLE (FLAGYL) 500 MG tablet Take 1 tablet (500 mg total) by mouth 3 (three) times daily for 7 days. 03/25/18 04/01/18 Yes Amin, Ankit Chirag, MD  ondansetron (ZOFRAN) 4 MG tablet Take 1 tablet (4 mg total) by mouth every 8 (eight) hours as needed for nausea or vomiting (If necessary, dont combine with Reglan.). 03/25/18  Yes Amin, Ankit Chirag, MD  pantoprazole (PROTONIX) 40 MG tablet Take 1 tablet (40 mg total) by mouth 2 (two) times daily. 02/20/18  Yes Burnadette Pop, MD  saccharomyces boulardii (FLORASTOR) 250 MG capsule Take 1 capsule (250 mg total) by mouth 2 (two) times daily for 10 days. 03/25/18 04/04/18 Yes Amin, Loura Halt, MD  mirtazapine (REMERON) 15 MG tablet Take 1 tablet (15 mg total) by mouth at bedtime. Patient not taking: Reported on 03/22/2018 01/12/18   Armbruster, Willaim Rayas, MD  promethazine (  PHENERGAN) 12.5 MG tablet TAKE 1 TABLET BY MOUTH  EVERY 8 HOURS AS NEEDED FOR NAUSEA AND VOMITING Patient not taking: Reported on 03/22/2018 03/11/18 03/22/18  Armbruster, Willaim Rayas, MD    Family History Family History  Problem Relation Age of Onset  . Diabetes Mother   . Hypertension Father   . Colon cancer Paternal Grandmother        pt thinks PGM was dx in her 49's  . Esophageal cancer Neg Hx   . Liver cancer Neg Hx   . Pancreatic cancer Neg Hx   . Stomach cancer Neg Hx   . Rectal cancer Neg Hx     Social History Social History   Tobacco Use  . Smoking status: Never Smoker  . Smokeless tobacco: Never Used  Substance Use Topics  . Alcohol use: No    Alcohol/week: 0.0 standard drinks    Frequency: Never  . Drug use: No     Allergies   Other and Lactose intolerance (gi)   Review of Systems Review of Systems All other systems negative except as documented in the HPI. All pertinent positives and negatives as reviewed in the  HPI.  Physical Exam Updated Vital Signs BP (!) 139/94   Pulse 88   Temp 98 F (36.7 C) (Oral)   Resp 16   Ht 5\' 7"  (1.702 m)   Wt 54.4 kg   LMP 03/23/2018   SpO2 100%   BMI 18.79 kg/m   Physical Exam Vitals signs and nursing note reviewed.  Constitutional:      General: She is not in acute distress.    Appearance: She is well-developed.  HENT:     Head: Normocephalic and atraumatic.  Eyes:     Pupils: Pupils are equal, round, and reactive to light.  Neck:     Musculoskeletal: Normal range of motion and neck supple.  Cardiovascular:     Rate and Rhythm: Normal rate and regular rhythm.     Heart sounds: Normal heart sounds. No murmur. No friction rub. No gallop.   Pulmonary:     Effort: Pulmonary effort is normal. No respiratory distress.     Breath sounds: Normal breath sounds. No wheezing.  Abdominal:     General: Bowel sounds are normal. There is no distension or abdominal bruit.     Palpations: Abdomen is soft. There is no fluid wave or mass.     Tenderness: There is generalized abdominal tenderness. There is no guarding or rebound.  Skin:    General: Skin is warm and dry.     Capillary Refill: Capillary refill takes less than 2 seconds.     Findings: No erythema or rash.  Neurological:     Mental Status: She is alert and oriented to person, place, and time.     Motor: No abnormal muscle tone.     Coordination: Coordination normal.  Psychiatric:        Behavior: Behavior normal.      ED Treatments / Results  Labs (all labs ordered are listed, but only abnormal results are displayed) Labs Reviewed  BASIC METABOLIC PANEL - Abnormal; Notable for the following components:      Result Value   Potassium 2.9 (*)    CO2 21 (*)    Glucose, Bld 266 (*)    BUN 5 (*)    Calcium 8.3 (*)    All other components within normal limits  CBC - Abnormal; Notable for the following components:   RBC 2.95 (*)  Hemoglobin 8.3 (*)    HCT 25.5 (*)    All other components  within normal limits  CBG MONITORING, ED - Abnormal; Notable for the following components:   Glucose-Capillary 259 (*)    All other components within normal limits  LIPASE, BLOOD  I-STAT TROPONIN, ED  I-STAT BETA HCG BLOOD, ED ([facility], [facility], [facility] ONLY)    EKG None  Radiology Dg Chest 2 View  Result Date: 03/26/2018 CLINICAL DATA:  Midsternal pain for 7 days. EXAM: CHEST - 2 VIEW COMPARISON:  December 19, 2017 FINDINGS: The heart size and mediastinal contours are within normal limits. Both lungs are clear. The visualized skeletal structures are unremarkable. IMPRESSION: No active cardiopulmonary disease. Electronically Signed   By: Sherian Rein M.D.   On: 03/26/2018 23:19    Procedures Procedures (including critical care time)  Medications Ordered in ED Medications  ondansetron (ZOFRAN-ODT) disintegrating tablet 4 mg (4 mg Oral Given 03/26/18 2241)  promethazine (PHENERGAN) injection 25 mg (25 mg Intravenous Given 03/27/18 0533)  LORazepam (ATIVAN) injection 1 mg (1 mg Intravenous Given 03/27/18 0534)  morphine 4 MG/ML injection 4 mg (4 mg Intravenous Given 03/27/18 0534)     Initial Impression / Assessment and Plan / ED Course  I have reviewed the triage vital signs and the nursing notes.  Pertinent labs & imaging results that were available during my care of the patient were reviewed by me and considered in my medical decision making (see chart for details).     Patient will be given medications along with IV fluids here in the emergency department.  At this time she does not have any significant abnormalities noted on her laboratory testing.  She does have some hypo-kalemia.  Patient will be reassessed once medications and fluids are completed.  Final Clinical Impressions(s) / ED Diagnoses   Final diagnoses:  None    ED Discharge Orders    None       Charlestine Night, PA-C 03/27/18 1610    Benjiman Core, MD 03/27/18 [phone]

## 2018-03-27 NOTE — Discharge Instructions (Signed)
Stay hydrated.   Follow up with your primary care doctor.   Return to ER for new or worsening symptoms, any additional concerns.

## 2018-03-27 NOTE — ED Notes (Signed)
Pt given water for fluid challenge 

## 2018-03-27 NOTE — ED Provider Notes (Signed)
Care assumed from previous provider PA Lawyer. Please see note for further details. Case discussed, plan agreed upon. Will re-evaluate after meds with likely discharge to home if tolerating PO. Briefly, patient is a 27 y.o. female who [place]ent[place] the emergency department for nausea/vomiting.  Hypokalemia of 2.9 which was replenished by previous provider.  No evidence of DKA.  On my independent review of previous lab work, hemoglobin of 8.3 was noted. 12.3 one month ago. She is on the last day or two of her menstrual cycle. Not on iron supplementation. Recommended that she start on iron and follow up with her PCP.   On initial re-evaluation, patient reports not feeling much better.  2 mg of Haldol given.  On next reevaluation, patient is tolerating p.o.  She has had no episodes of emesis while under my care.  Will discharge to home with recommendations to follow-up with PCP.   Edem Tiegs, Chase Picket, PA-C 03/27/18 2035    Lorre Nick, MD 03/27/18 [phone]

## 2018-04-01 ENCOUNTER — Other Ambulatory Visit: Payer: Self-pay

## 2018-04-01 ENCOUNTER — Other Ambulatory Visit (INDEPENDENT_AMBULATORY_CARE_PROVIDER_SITE_OTHER): Payer: 59

## 2018-04-01 ENCOUNTER — Ambulatory Visit (INDEPENDENT_AMBULATORY_CARE_PROVIDER_SITE_OTHER): Payer: 59 | Admitting: Gastroenterology

## 2018-04-01 VITALS — BP 108/70 | HR 82 | Ht 67.0 in | Wt 137.1 lb

## 2018-04-01 DIAGNOSIS — D649 Anemia, unspecified: Secondary | ICD-10-CM

## 2018-04-01 DIAGNOSIS — K3184 Gastroparesis: Secondary | ICD-10-CM

## 2018-04-01 DIAGNOSIS — K529 Noninfective gastroenteritis and colitis, unspecified: Secondary | ICD-10-CM | POA: Diagnosis not present

## 2018-04-01 DIAGNOSIS — E1069 Type 1 diabetes mellitus with other specified complication: Secondary | ICD-10-CM

## 2018-04-01 LAB — CBC WITH DIFFERENTIAL/PLATELET
Basophils Absolute: 0 10*3/uL (ref 0.0–0.1)
Basophils Relative: 0.8 % (ref 0.0–3.0)
Eosinophils Absolute: 0.2 10*3/uL (ref 0.0–0.7)
Eosinophils Relative: 2.9 % (ref 0.0–5.0)
HCT: 26.1 % — ABNORMAL LOW (ref 36.0–46.0)
Hemoglobin: 8.9 g/dL — ABNORMAL LOW (ref 12.0–15.0)
Lymphocytes Relative: 48.4 % — ABNORMAL HIGH (ref 12.0–46.0)
Lymphs Abs: 2.7 10*3/uL (ref 0.7–4.0)
MCHC: 34.1 g/dL (ref 30.0–36.0)
MCV: 88.3 fl (ref 78.0–100.0)
Monocytes Absolute: 0.4 10*3/uL (ref 0.1–1.0)
Monocytes Relative: 8.1 % (ref 3.0–12.0)
Neutro Abs: 2.2 10*3/uL (ref 1.4–7.7)
Neutrophils Relative %: 39.8 % — ABNORMAL LOW (ref 43.0–77.0)
Platelets: 240 10*3/uL (ref 150.0–400.0)
RBC: 2.96 Mil/uL — ABNORMAL LOW (ref 3.87–5.11)
RDW: 14 % (ref 11.5–15.5)
WBC: 5.5 10*3/uL (ref 4.0–10.5)

## 2018-04-01 LAB — BASIC METABOLIC PANEL
BUN: 13 mg/dL (ref 6–23)
CO2: 36 mEq/L — ABNORMAL HIGH (ref 19–32)
Calcium: 9.6 mg/dL (ref 8.4–10.5)
Chloride: 90 mEq/L — ABNORMAL LOW (ref 96–112)
Creatinine, Ser: 0.74 mg/dL (ref 0.40–1.20)
GFR: 119.63 mL/min (ref >60.00)
Glucose, Bld: 485 mg/dL — ABNORMAL HIGH (ref 70–99)
Potassium: 5 mEq/L (ref 3.5–5.1)
Sodium: 131 mEq/L — ABNORMAL LOW (ref 135–145)

## 2018-04-01 NOTE — Patient Instructions (Addendum)
Normal BMI (Body Mass Index- based on height and weight) is between 19 and 25. Your BMI today is Body mass index is 21.48 kg/m.Marland Kitchen Please consider follow up  regarding your BMI with your Primary Care Provider.  Please go to the lab in the basement of our building to have lab work done as you leave today. Hit "B" for basement when you get on the elevator.  When the doors open the lab is on your left.  We will call you with the results. Thank you.  Please call [facility] to reschedule your appointment: [phone]   Please continue your current medications.  To make a slurry with Carafate tablets: Add 1 Tablespoon of distilled water to tablet. Allow to slowly dissolve - do not crush.    Thank you for entrusting me with your care and for choosing [facility], Dr. Ileene Patrick

## 2018-04-01 NOTE — Progress Notes (Signed)
HPI :  28 y/o female here for a follow up visit. She has seen me in the past for gastroparesis and chronic diarrhea. She has longstanding type I diabetes.   In short, she's had a history of EGD without any significant pathology, with prior abnormal gastric emptying studies. She is not had that much benefit from Reglan. 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 She has chronic diarrhea this use of prucalapride has been avoided. In the past year she's been tried on Remeron which has helped some of her symptoms. She is currently taking 50 mg of Remeron every night, she is taking Protonix 40 mg twice daily., She takes Phenergan 12.5 mg twice a day, Zofran only as needed. She uses Carafate as needed as well. She only uses Reglan have severe symptoms although this causes diarrhea and she does not like taking it. At baseline on her current regimen she is able to function and tolerate small, more frequent meals. Unfortunately she is had 2 severe episodes in the past 2 months which have led to hospitalization. She reports at the start of her menstrual cycle (which recently came back, previously was without menses for a year), she had severe vomiting. Her recurrent vomiting led to dehydration and development of DKA twice. Her hemoglobin A1c has risen from fives to 12 over the past year. She strongly feels that her menstrual cycle makes her stomach functioning much worse. She has had some ongoing reflux symptoms and burning in her chest when she has vomiting. She initially lost 10 pounds on her prior hospitalization however has regained most of that back. I previously had referred her to [facility] for motility specialty care, consideration for gastric pacemaker. She is scheduled to see them on December 23 but unfortunately was hospitalized and cannot make that appointment.  She otherwise continues to have chronic loose stools. Anywhere from one to 2 loose stools today, on average 23-5 bowel  movements per day, on a severe day has upwards of 10 stools a day. She is about 3-4 mg a day. Again not using Reglan due to severe diarrhea with its use. I previously had ordered pancreatic fecal elastase however she was not able to submit that sample. She's had prior CT scans showing some inflammation of her colon. Prior colonoscopy for this issue with Dr. Ardis Hughs in March was normal. Biopsies showed no evidence of microscopic colitis and there was no evidence of Crohn's disease. She does have a history of perianal/perirectal abscess. That had been treated and she has not had any recent issues with that. More recent CT scan when hospitalized again shows some thickening of the colon. She has been on empiric trials of colestyramine and Creon in the past, neither of which helped.   Of note, when hospitalized with DKA her Hgb drifted from normal to 8s. She denies any overt GI bleeding, passing brown stool. Iron studies and B12/folate without etiology.   Prior evaluation: Abnormal GES in the past c/w gastroparesis Colonoscopy 06/27/2017 - normal colon and ileum - normal random biopsies Flex sig 03/10/2017 - poor prep, normal colon with normal biopsies EGD 12/03/2011 - not completed due to retained food EGD 10/05/2017 - retained gastric fluid which limited some views of body / fundus, otherwise normal, no outlet obstruction  CT scan 03/23/2018 - distended stomach, mild transmural thickening of small and large bowel, mild hepatic steatosis CT angio scan 12/23/2017 - mild wall thickening of transverse colon, marked distension of bladder, body wall and mesenteric  edema    Past Medical History:  Diagnosis Date  . Allergy   . Anemia   . Anxiety   . Blood transfusion without reported diagnosis    Dec 2018  . Cataract    right eye  . Depression   . Diabetes type 1, uncontrolled ([place]) 11/14/2011   Since age 41  . Fibromyalgia   . Gastroparesis   . GERD (gastroesophageal reflux disease)   . Hypertension     . Infection    UTI April 2016     Past Surgical History:  Procedure Laterality Date  . ANKLE SURGERY    . CHOLECYSTECTOMY  11/15/2011   Procedure: LAPAROSCOPIC CHOLECYSTECTOMY WITH INTRAOPERATIVE CHOLANGIOGRAM;  Surgeon: [name], MD;  Location: [facility];  Service: General;  Laterality: N/A;  . COLONOSCOPY    . COLONOSCOPY WITH PROPOFOL N/A 06/27/2017   Procedure: COLONOSCOPY WITH PROPOFOL;  Surgeon: Milus Banister, MD;  Location: [facility];  Service: Endoscopy;  Laterality: N/A;  . ESOPHAGOGASTRODUODENOSCOPY  12/03/2011   Procedure: ESOPHAGOGASTRODUODENOSCOPY (EGD);  Surgeon: Beryle Beams, MD;  Location: [facility];  Service: Endoscopy;  Laterality: N/A;  . FLEXIBLE SIGMOIDOSCOPY N/A 03/10/2017   Procedure: FLEXIBLE SIGMOIDOSCOPY;  Surgeon: Carol Ada, MD;  Location: [facility];  Service: Endoscopy;  Laterality: N/A;  . INCISION AND DRAINAGE PERIRECTAL ABSCESS N/A 03/01/2017   Procedure: IRRIGATION AND DEBRIDEMENT PERIRECTAL ABSCESS;  Surgeon: Alphonsa Overall, MD;  Location: [facility];  Service: General;  Laterality: N/A;  . IRRIGATION AND DEBRIDEMENT BUTTOCKS N/A 03/23/2017   Procedure: IRRIGATION AND DEBRIDEMENT BUTTOCKS, SETON PLACEMENT;  Surgeon: [name], MD;  Location: [facility];  Service: General;  Laterality: N/A;  . LAPAROSCOPY  11/23/2011   Procedure: LAPAROSCOPY DIAGNOSTIC;  Surgeon: Edward Jolly, MD;  Location: [facility];  Service: General;  Laterality: N/A;  . SIGMOIDOSCOPY    . UPPER GASTROINTESTINAL ENDOSCOPY    . WISDOM TOOTH EXTRACTION     Family History  Problem Relation Age of Onset  . Diabetes Mother   . Hypertension Father   . Colon cancer Paternal Grandmother        pt thinks PGM was dx in her 104's  . Esophageal cancer Neg Hx   . Liver cancer Neg Hx   . Pancreatic cancer Neg Hx   . Stomach cancer Neg Hx   . Rectal cancer Neg Hx    Social History   Tobacco Use  . Smoking status: Never Smoker  . Smokeless tobacco: Never Used  Substance  Use Topics  . Alcohol use: No    Alcohol/week: 0.0 standard drinks    Frequency: Never  . Drug use: No   Current Outpatient Medications  Medication Sig Dispense Refill  . acetaminophen (TYLENOL) 500 MG tablet Take 1,000 mg by mouth daily as needed for moderate pain.     . hyoscyamine (LEVSIN SL) 0.125 MG SL tablet Place 1 tablet (0.125 mg total) under the tongue every 6 (six) hours as needed for cramping (abdominal pain). 30 tablet 0  . insulin lispro (HUMALOG) 100 UNIT/ML injection Inject 0-0.09 mLs (0-9 Units total) into the skin 3 (three) times daily with meals. 10 mL 0  . LANTUS 100 UNIT/ML injection Inject 0.12 mLs (12 Units total) into the skin daily. 10 mL 0  . metoCLOPramide (REGLAN) 10 MG tablet Take 1 tablet (10 mg total) by mouth every 6 (six) hours as needed for nausea or vomiting. 20 tablet 0  . mirtazapine (REMERON) 15 MG tablet Take 1 tablet (15 mg  total) by mouth at bedtime. 90 tablet 1  . ondansetron (ZOFRAN) 4 MG tablet Take 1 tablet (4 mg total) by mouth every 8 (eight) hours as needed for nausea or vomiting (If necessary, dont combine with Reglan.). 20 tablet 0  . pantoprazole (PROTONIX) 40 MG tablet Take 1 tablet (40 mg total) by mouth 2 (two) times daily. 60 tablet 0  . promethazine (PHENERGAN) 12.5 MG suppository Place 12.5 mg rectally every 6 (six) hours as needed for nausea or vomiting.     No current facility-administered medications for this visit.    Allergies  Allergen Reactions  . Other Anaphylaxis    Reaction to [place] nuts   . Lactose Intolerance (Gi) Diarrhea     Review of Systems: All systems reviewed and negative except where noted in HPI.    Dg Chest 2 View  Result Date: 03/26/2018 CLINICAL DATA:  Midsternal pain for 7 days. EXAM: CHEST - 2 VIEW COMPARISON:  December 19, 2017 FINDINGS: The heart size and mediastinal contours are within normal limits. Both lungs are clear. The visualized skeletal structures are unremarkable. IMPRESSION: No  active cardiopulmonary disease. Electronically Signed   By: Abelardo Diesel M.D.   On: 03/26/2018 23:19   Ct Abdomen Pelvis W Contrast  Result Date: 03/23/2018 CLINICAL DATA:  Upper abdominal and back pain since Sunday with nausea and vomiting. EXAM: CT ABDOMEN AND PELVIS WITH CONTRAST TECHNIQUE: Multidetector CT imaging of the abdomen and pelvis was performed using the standard protocol following bolus administration of intravenous contrast. CONTRAST:  178m OMNIPAQUE IOHEXOL 300 MG/ML  SOLN COMPARISON:  12/23/2017 FINDINGS: Lower chest: No acute abnormality. Hepatobiliary: No focal liver abnormality is seen. Hypodense appearance of the liver parenchyma compatible with mild steatosis. Status post cholecystectomy. No biliary dilatation. Pancreas: Atrophic without mass or inflammation. Spleen: Normal Adrenals/Urinary Tract: Normal bilateral adrenal glands and kidneys. Physiologic distention of the urinary bladder without focal mural thickening or calculus. Stomach/Bowel: Distended stomach with ingested oral contrast. The possibility of gastroparesis or delayed gastric emptying is a possibility. Nonobstructed, nondistended small bowel loops. There is generalized mild transmural thickening of the colon. This in conjunction with hazy mesenteric edema is nonspecific but may represent stigmata of enterocolitis. Vascular/Lymphatic: No significant vascular findings are present. No enlarged abdominal or pelvic lymph nodes. Reproductive: Uterus and bilateral adnexa are unremarkable. Other: No free air. Mild soft tissue anasarca, slightly more focal overlying the sacrum. Musculoskeletal: No acute or significant osseous findings. IMPRESSION: 1. Distended stomach with ingested oral contrast. The possibility of gastroparesis or delayed gastric emptying is a possibility. 2. Mild transmural thickened appearance of the small and large intestine with mesenteric edema, nonspecific but consider inflammatory bowel disease,  enterocolitis, hypoproteinemia, unlikely ischemia among some considerations. 3. Mild hepatic steatosis. Electronically Signed   By: DAshley RoyaltyM.D.   On: 03/23/2018 14:08      Physical Exam: BP 108/70   Pulse 82   Ht 5' 7"  (1.702 m)   Wt 137 lb 2 oz (62.2 kg)   LMP 03/23/2018   BMI 21.48 kg/m  Constitutional: Pleasant, thin female in no acute distress. HEENT: Normocephalic and atraumatic. Conjunctivae are normal. No scleral icterus. Neck supple.  Cardiovascular: Normal rate, regular rhythm.  Pulmonary/chest: Effort normal and breath sounds normal. No wheezing, rales or rhonchi. Abdominal: Soft, nondistended, nontender. . There are no masses palpable. No hepatomegaly. Extremities: no edema Lymphadenopathy: No cervical adenopathy noted. Neurological: Alert and oriented to person place and time. Skin: Skin is warm and dry. No rashes noted.  Psychiatric: Normal mood and affect. Behavior is normal.   ASSESSMENT AND PLAN: 28 year old female here for reassessment of the following issues:  Gastroparesis / diabetes / GERD - severe gastroparesis. She appears to have episodic worsening during her menstrual cycles which have recently returned, this has led to cycles of severe dehydration which has precipitated DKA twice in the past 2 months. Very difficult situation in that she does not tolerate Reglan nor respond much to it. On Remeron and anti-emetics. Her QTC has fluctuated from normal to prolonged, I'm hesitant to give her domperidone or increase the Remeron in this light. Given her chronic diarrhea I do not think she would tolerate Prucalopride. She is working on control of her diabetes, on short and long-acting insulin but remains rather brittle. I ultimately think she needs to be seen by a tertiary care specialty center for her gastroparesis, consideration for gastric pacemaker which we do not offer here. She was scheduled last week but unfortunately missed it due to hospitalization. She will  call back to reschedule. She will continue her regimen for now, hesitant to make significant changes due to fluctuating changes in QTc. Will repeat BMET to ensure K is stable today. Will add carafate slurries to use PRN. She will follow up with her gynecologist about her menstrual cycles.  Chronic diarrhea - no evidence of Crohn's or microscopic colitis on recent colonoscopy. Her colonoscopy was done for this issue and prior CT scan showing bowel wall thickening which had no endoscopic correlating findings. She continues to have diarrhea and some bowel wall thickening on imaging. Will send GI pathogen panel to ensure negative given recent hospitalization, ensure no C diff. Will also send pancreatic fecal elastase and fecal lactoferrin. She will continue immodium for now. May consider repeat colonoscopy in regards to anemia below pending her course. Will send TTg / IgA during next lab draw.  Anemia - noted recently during hospitalization in the setting of fluid resuscitation and no overt bleeding other than menstrual cycle. Will recheck to see where this is trending, unclear if this was partially dilutional initially.   Magazine Cellar, MD [facility]

## 2018-05-03 ENCOUNTER — Other Ambulatory Visit: Payer: Self-pay | Admitting: Gastroenterology

## 2018-05-03 DIAGNOSIS — K3184 Gastroparesis: Secondary | ICD-10-CM

## 2018-05-04 NOTE — Telephone Encounter (Signed)
Yes okay to refill. Thanks

## 2018-05-04 NOTE — Telephone Encounter (Signed)
Just to confirm - she is staying on these? Ok to refill with refills?

## 2018-05-11 ENCOUNTER — Inpatient Hospital Stay (HOSPITAL_COMMUNITY)
Admission: EM | Admit: 2018-05-11 | Discharge: 2018-05-18 | DRG: 074 | Disposition: A | Discharge status: Home / Self Care | Payer: 59 | Attending: Internal Medicine | Admitting: Internal Medicine

## 2018-05-11 ENCOUNTER — Emergency Department (HOSPITAL_COMMUNITY): Payer: 59

## 2018-05-11 ENCOUNTER — Other Ambulatory Visit: Payer: Self-pay

## 2018-05-11 DIAGNOSIS — R109 Unspecified abdominal pain: Secondary | ICD-10-CM

## 2018-05-11 DIAGNOSIS — E1043 Type 1 diabetes mellitus with diabetic autonomic (poly)neuropathy: Secondary | ICD-10-CM | POA: Diagnosis not present

## 2018-05-11 DIAGNOSIS — K3184 Gastroparesis: Secondary | ICD-10-CM

## 2018-05-11 DIAGNOSIS — R000 Tachycardia, unspecified: Secondary | ICD-10-CM | POA: Diagnosis present

## 2018-05-11 DIAGNOSIS — E440 Moderate protein-calorie malnutrition: Secondary | ICD-10-CM | POA: Diagnosis present

## 2018-05-11 DIAGNOSIS — K219 Gastro-esophageal reflux disease without esophagitis: Secondary | ICD-10-CM | POA: Diagnosis present

## 2018-05-11 DIAGNOSIS — Z681 Body mass index (BMI) 19 or less, adult: Secondary | ICD-10-CM

## 2018-05-11 DIAGNOSIS — R9431 Abnormal electrocardiogram [ECG] [EKG]: Secondary | ICD-10-CM | POA: Diagnosis present

## 2018-05-11 DIAGNOSIS — Z79899 Other long term (current) drug therapy: Secondary | ICD-10-CM

## 2018-05-11 DIAGNOSIS — R1110 Vomiting, unspecified: Secondary | ICD-10-CM | POA: Diagnosis not present

## 2018-05-11 DIAGNOSIS — K760 Fatty (change of) liver, not elsewhere classified: Secondary | ICD-10-CM | POA: Diagnosis present

## 2018-05-11 DIAGNOSIS — Z794 Long term (current) use of insulin: Secondary | ICD-10-CM

## 2018-05-11 DIAGNOSIS — Z833 Family history of diabetes mellitus: Secondary | ICD-10-CM

## 2018-05-11 DIAGNOSIS — R112 Nausea with vomiting, unspecified: Secondary | ICD-10-CM

## 2018-05-11 DIAGNOSIS — E876 Hypokalemia: Secondary | ICD-10-CM | POA: Diagnosis present

## 2018-05-11 DIAGNOSIS — I10 Essential (primary) hypertension: Secondary | ICD-10-CM | POA: Diagnosis present

## 2018-05-11 DIAGNOSIS — Z8249 Family history of ischemic heart disease and other diseases of the circulatory system: Secondary | ICD-10-CM

## 2018-05-11 DIAGNOSIS — M797 Fibromyalgia: Secondary | ICD-10-CM | POA: Diagnosis present

## 2018-05-11 DIAGNOSIS — E739 Lactose intolerance, unspecified: Secondary | ICD-10-CM | POA: Diagnosis present

## 2018-05-11 DIAGNOSIS — K529 Noninfective gastroenteritis and colitis, unspecified: Secondary | ICD-10-CM | POA: Diagnosis present

## 2018-05-11 DIAGNOSIS — G8929 Other chronic pain: Secondary | ICD-10-CM | POA: Diagnosis present

## 2018-05-11 DIAGNOSIS — E109 Type 1 diabetes mellitus without complications: Secondary | ICD-10-CM | POA: Diagnosis present

## 2018-05-11 DIAGNOSIS — D649 Anemia, unspecified: Secondary | ICD-10-CM | POA: Diagnosis present

## 2018-05-11 LAB — COMPREHENSIVE METABOLIC PANEL
ALT: 21 U/L (ref 0–44)
AST: 25 U/L (ref 15–41)
Albumin: 4.1 g/dL (ref 3.5–5.0)
Alkaline Phosphatase: 99 U/L (ref 38–126)
Anion gap: 12 (ref 5–15)
BUN: 13 mg/dL (ref 6–20)
CO2: 30 mmol/L (ref 22–32)
Calcium: 9.8 mg/dL (ref 8.9–10.3)
Chloride: 97 mmol/L — ABNORMAL LOW (ref 98–111)
Creatinine, Ser: 0.5 mg/dL (ref 0.44–1.00)
GFR calc Af Amer: >60 mL/min (ref >60)
GFR calc non Af Amer: >60 mL/min (ref >60)
Glucose, Bld: 255 mg/dL — ABNORMAL HIGH (ref 70–99)
Potassium: 3.4 mmol/L — ABNORMAL LOW (ref 3.5–5.1)
Sodium: 139 mmol/L (ref 135–145)
Total Bilirubin: 0.7 mg/dL (ref 0.3–1.2)
Total Protein: 8.8 g/dL — ABNORMAL HIGH (ref 6.5–8.1)

## 2018-05-11 LAB — CBC
HCT: 35.7 % — ABNORMAL LOW (ref 36.0–46.0)
Hemoglobin: 11.8 g/dL — ABNORMAL LOW (ref 12.0–15.0)
MCH: 27.5 pg (ref 26.0–34.0)
MCHC: 33.1 g/dL (ref 30.0–36.0)
MCV: 83.2 fL (ref 80.0–100.0)
Platelets: 228 10*3/uL (ref 150–400)
RBC: 4.29 MIL/uL (ref 3.87–5.11)
RDW: 11.9 % (ref 11.5–15.5)
WBC: 4.6 10*3/uL (ref 4.0–10.5)
nRBC: 0 % (ref 0.0–0.2)

## 2018-05-11 LAB — URINALYSIS, ROUTINE W REFLEX MICROSCOPIC
Bacteria, UA: NONE SEEN
Bilirubin Urine: NEGATIVE
Glucose, UA: >=500 mg/dL — AB
Ketones, ur: 5 mg/dL — AB
Leukocytes,Ua: NEGATIVE
Nitrite: NEGATIVE
Protein, ur: 100 mg/dL — AB
Specific Gravity, Urine: 1.028 (ref 1.005–1.030)
pH: 7 (ref 5.0–8.0)

## 2018-05-11 LAB — I-STAT BETA HCG BLOOD, ED (MC, WL, AP ONLY): I-stat hCG, quantitative: <5 m[IU]/mL (ref <5)

## 2018-05-11 LAB — CBG MONITORING, ED: Glucose-Capillary: 249 mg/dL — ABNORMAL HIGH (ref 70–99)

## 2018-05-11 LAB — LIPASE, BLOOD: Lipase: 25 U/L (ref 11–51)

## 2018-05-11 IMAGING — CR DG ABDOMEN ACUTE W/ 1V CHEST
4 series · 4 of 4 positions shown · non-contrast
Comparison: [DATE]

CLINICAL DATA: Abdominal pain and vomiting

EXAM:
DG ABDOMEN ACUTE W/ 1V CHEST

[x chest ap]
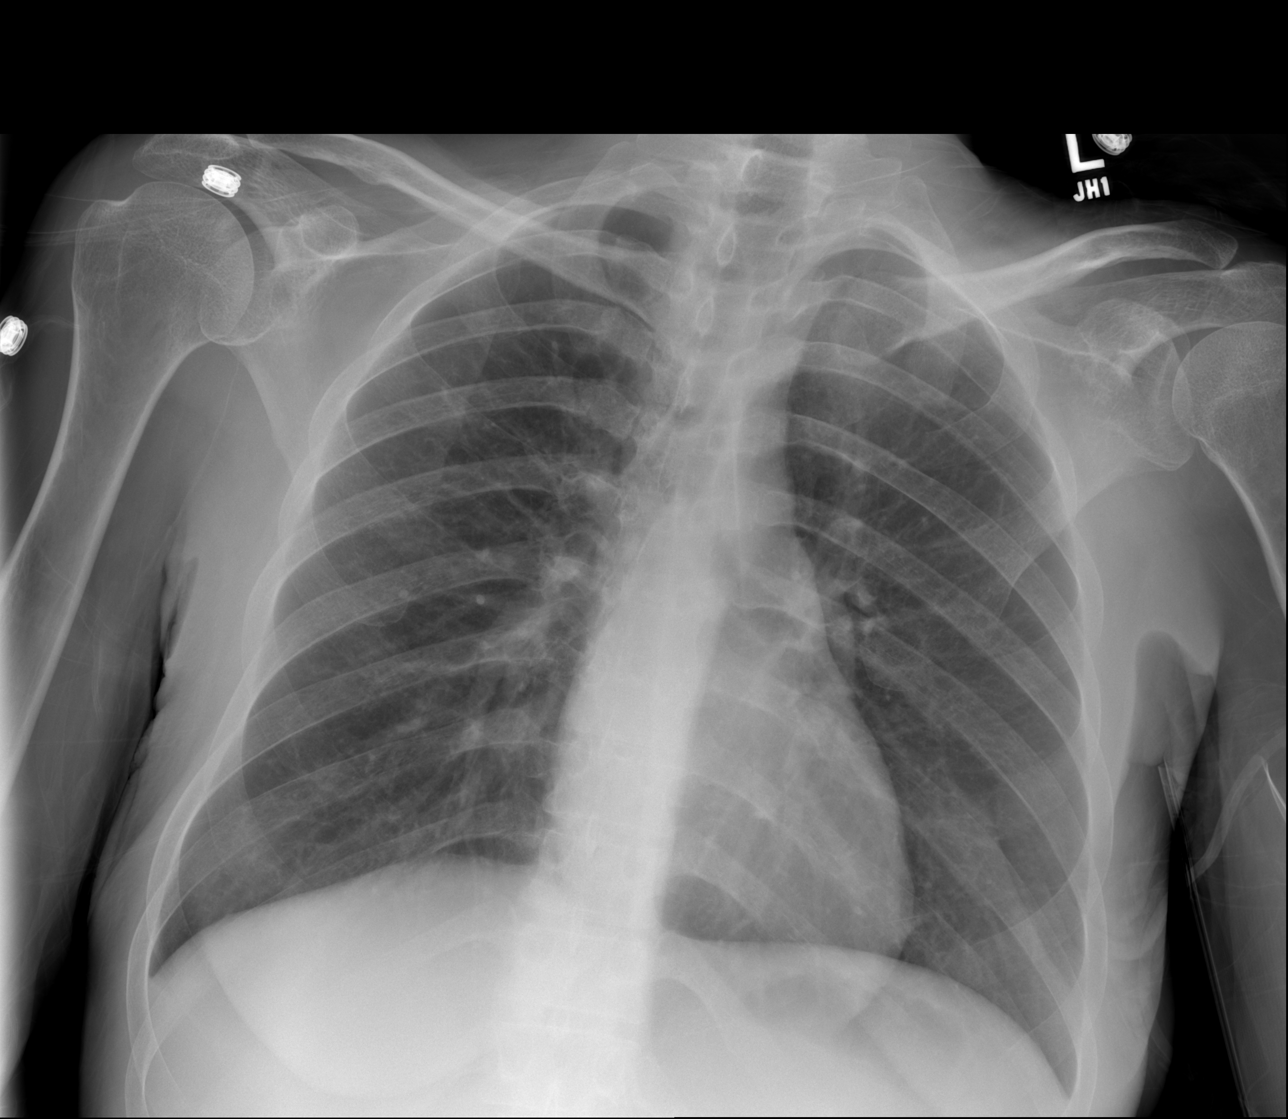

[x abdomen supine (1 of 3)]
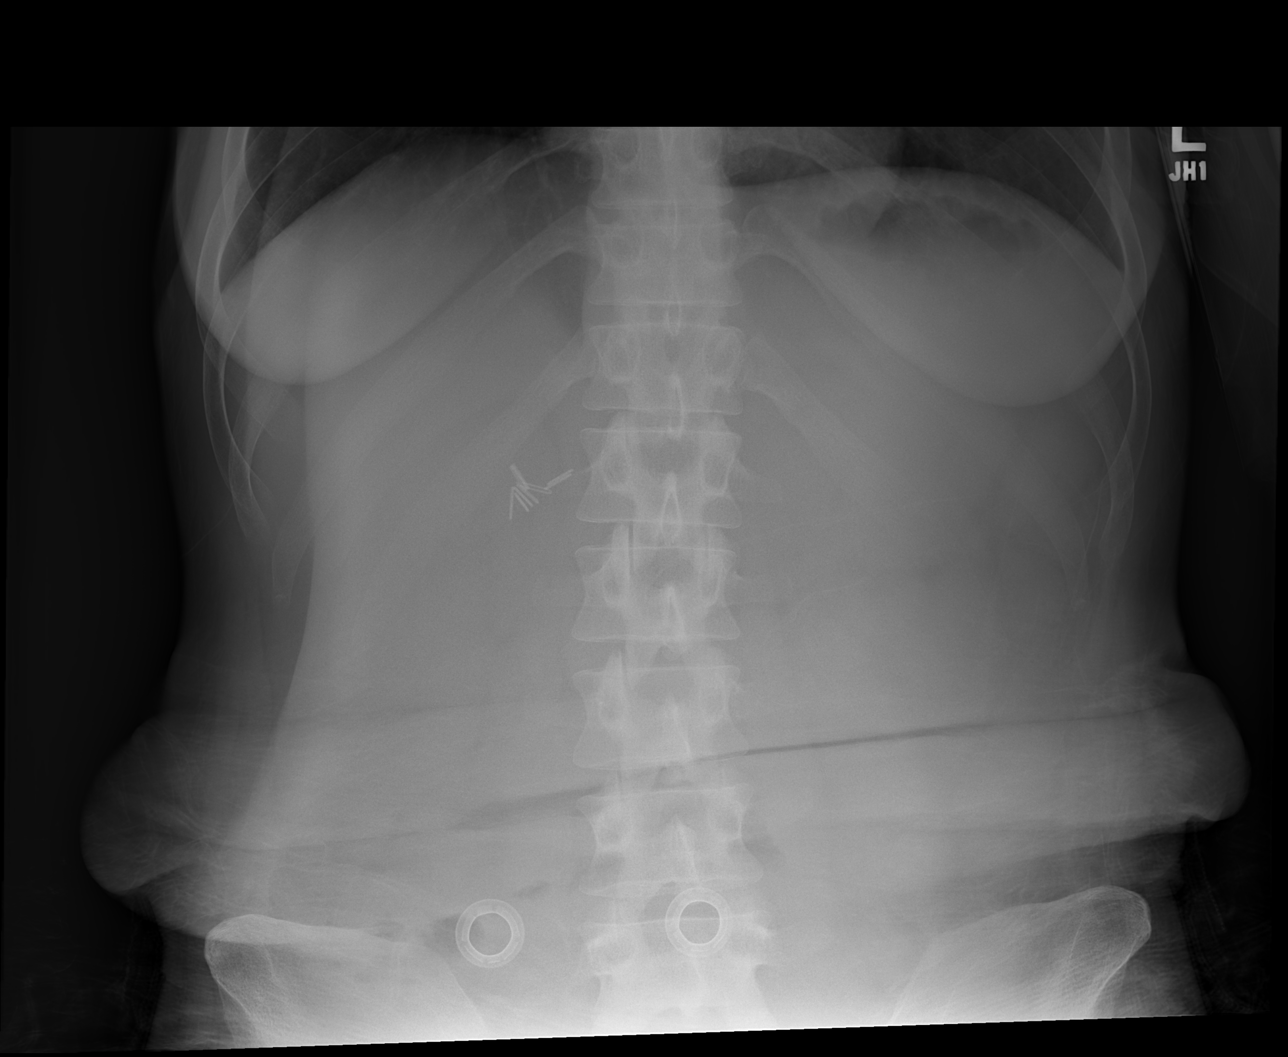

[x abdomen supine (2 of 3)]
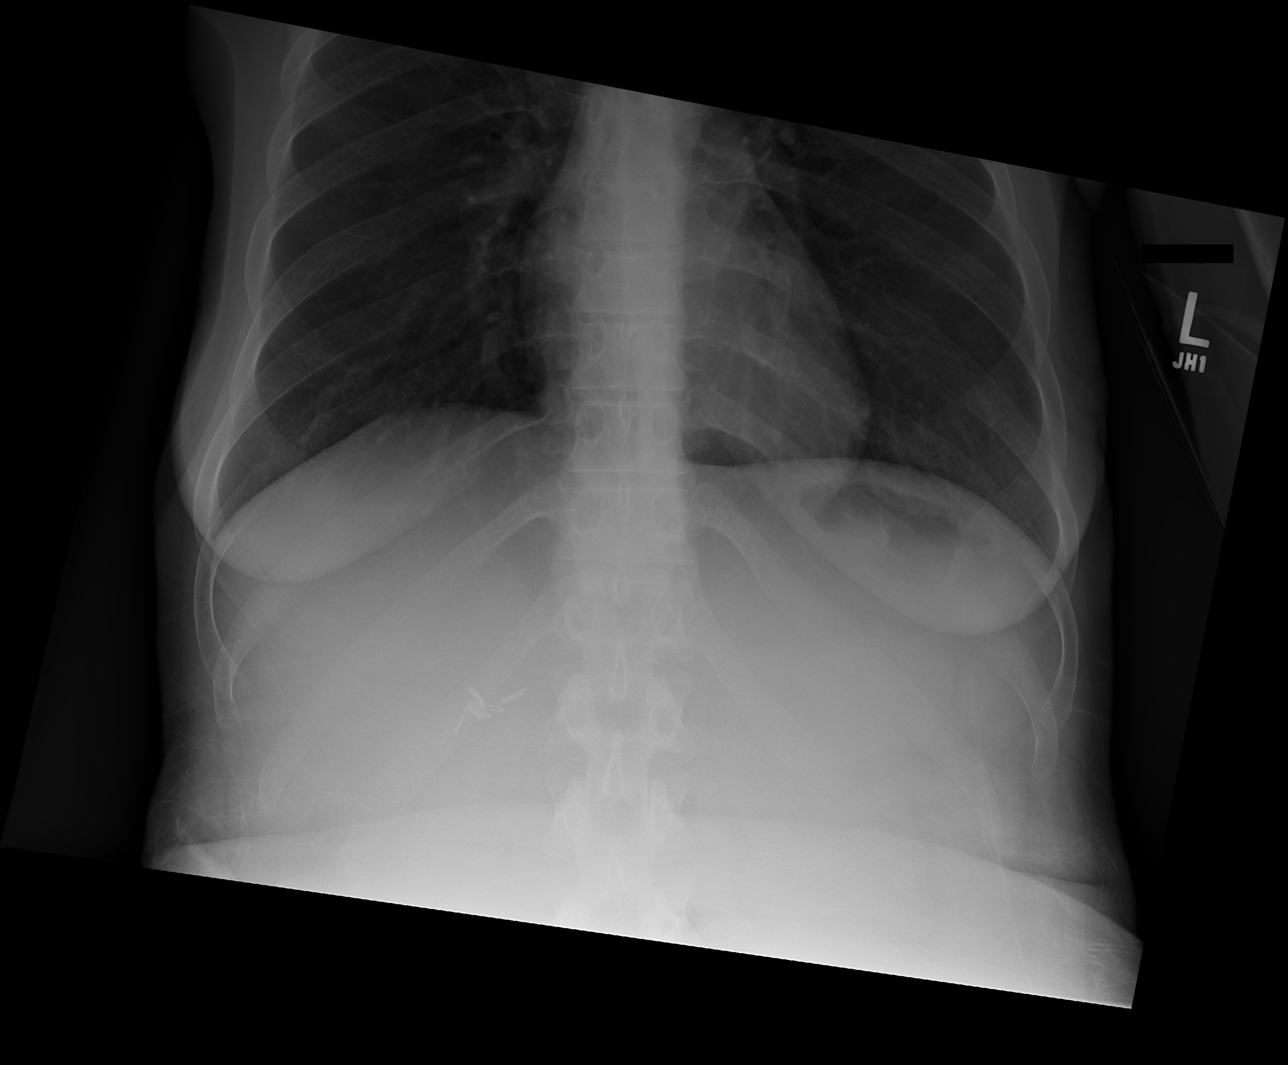

[x abdomen supine (3 of 3)]
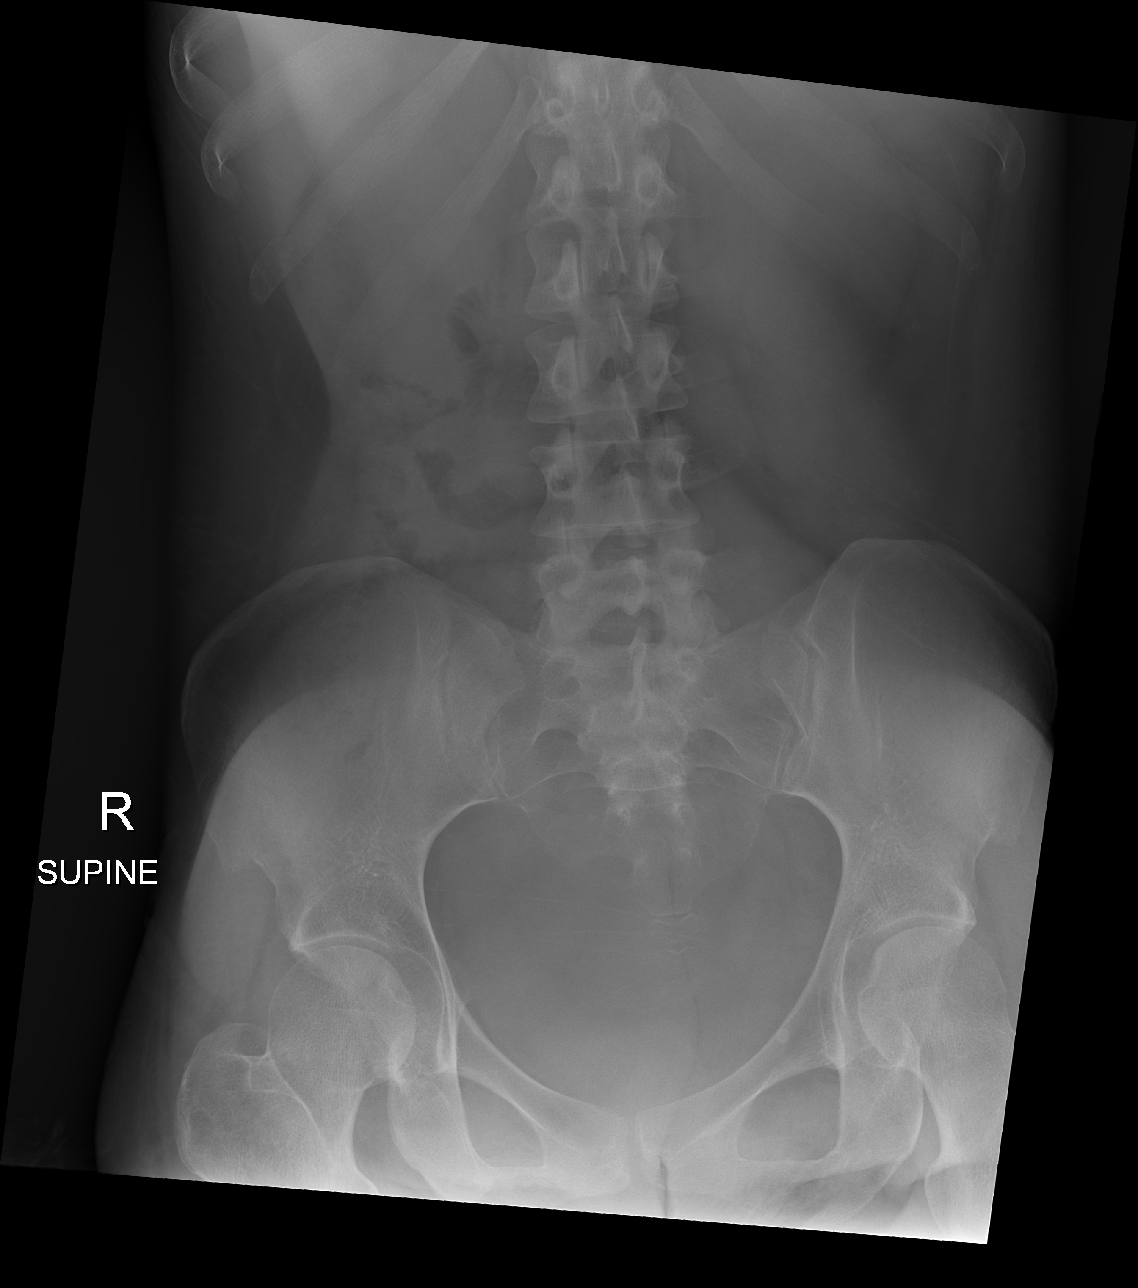

[4 of 4 positions shown; findings below may reference images not displayed]

FINDINGS: Cardiac shadow is within normal limits. The lungs are well aerated
bilaterally. No focal infiltrate or sizable effusion is seen.

Scattered large and small bowel gas is noted. No free air is seen.
No abnormal mass or abnormal calcifications are noted. No acute bony
abnormality is seen.
IMPRESSION: No acute abnormality noted.

## 2018-05-11 MED ORDER — SODIUM CHLORIDE 0.9 % IV BOLUS
2000.0000 mL | Freq: Once | INTRAVENOUS | Status: AC
  Administered 2018-05-11: 2000 mL via INTRAVENOUS

## 2018-05-11 MED ORDER — ONDANSETRON 4 MG PO TBDP
4.0000 mg | ORAL_TABLET | Freq: Once | ORAL | Status: AC | PRN
  Administered 2018-05-11: 4 mg via ORAL
  Filled 2018-05-11: qty 1

## 2018-05-11 MED ORDER — FENTANYL CITRATE (PF) 100 MCG/2ML IJ SOLN
50.0000 ug | Freq: Once | INTRAMUSCULAR | Status: AC
  Administered 2018-05-11: 50 ug via INTRAVENOUS
  Filled 2018-05-11: qty 2

## 2018-05-11 MED ORDER — HALOPERIDOL LACTATE 5 MG/ML IJ SOLN
2.0000 mg | Freq: Once | INTRAMUSCULAR | Status: AC
  Administered 2018-05-11: 2 mg via INTRAVENOUS
  Filled 2018-05-11: qty 1

## 2018-05-11 NOTE — ED Notes (Signed)
Blood draw attempt unsuccessful. RN notified. 

## 2018-05-11 NOTE — ED Triage Notes (Addendum)
Pt to ED with husband. Pt tearful, c/o back pain 10/10, Pt c/o N/V/D. Pt c/o abdominal pain. Ongoing for two days with no relief.

## 2018-05-11 NOTE — ED Provider Notes (Signed)
[facility]-EMERGENCY DEPT Provider Note   CSN: [id number] Arrival date & time: 05/11/18  1539     History   Chief Complaint Chief Complaint  Patient presents with  . Nausea    vomitting, back pain     HPI [name] is a 28 y.o. [place] with a history of type 1 diabetes, gastroparesis who presents emergency department chief complaint of abdominal pain nausea and vomiting.  Patient states that she has had 2 days of epigastric abdominal pain and vomiting consistent with previous episodes of gastroparesis.  She has pain in her back which is chronic.  She denies urinary symptoms.  She is on able to hold down foods or fluid for the past day and a half.  Had multiple episodes of nonbloody nonbilious vomitus  HPI  Past Medical History:  Diagnosis Date  . Allergy   . Anemia   . Anxiety   . Blood transfusion without reported diagnosis    Dec 2018  . Cataract    right eye  . Depression   . Diabetes type 1, uncontrolled (HCC) 11/14/2011   Since age 64  . Fibromyalgia   . Gastroparesis   . GERD (gastroesophageal reflux disease)   . Hypertension   . Infection    UTI April 2016    Patient Active Problem List   Diagnosis Date Noted  . DKA, type 1, not at goal [facility]) 03/22/2018  . AKI (acute kidney injury) (HCC) 03/22/2018  . Hyperbilirubinemia 03/22/2018  . DKA (diabetic ketoacidoses) (HCC) 07/27/2017  . Normocytic anemia 07/10/2017  . SIRS (systemic inflammatory response syndrome) (HCC) 06/23/2017  . Hypokalemia 06/23/2017  . Normocytic normochromic anemia 06/23/2017  . GERD (gastroesophageal reflux disease) 06/23/2017  . Intractable nausea and vomiting 04/11/2017  . Nausea and vomiting 04/10/2017  . Essential hypertension   . Colitis   . Abscess and cellulitis of gluteal region 03/24/2017  . Cellulitis and abscess of buttock 03/23/2017  . Malnutrition of moderate degree 03/19/2017  . Anasarca 03/16/2017  . MDD (major depressive disorder),  recurrent episode, mild (HCC) 02/28/2017  . Diarrhea 02/27/2017  . Lower GI bleed 02/27/2017  . DKA (diabetic ketoacidosis) (HCC) 02/26/2017  . Acute metabolic encephalopathy   . DKA, type 1 (HCC) 06/11/2013  . Orthostatic hypotension dysautonomic syndrome (HCC) 04/28/2013  . Body aches 04/28/2013  . Orthostatic hypotension 04/28/2013  . Diabetes mellitus type 1 (HCC) 04/28/2013  . Protein-calorie malnutrition, severe (HCC) 04/28/2013  . Noncompliance with diabetes treatment 09/01/2012  . Dyspnea 02/28/2012  . Sinus tachycardia 02/28/2012  . Hyperglycemia without ketosis 02/28/2012  . Chest pain 12/04/2011  . Insulin dependent diabetes mellitus (HCC) 11/14/2011  . Hypertension associated with diabetes (HCC) 11/14/2011  . Gastroparesis 11/14/2011  . Hyponatremia 09/19/2011  . Chronic abdominal pain 09/18/2011  . Metabolic alkalosis 09/18/2011  . Diabetic hyperosmolar non-ketotic state (HCC) 09/18/2011  . DIAB W/UNSPEC COMP TYPE I [JUV TYPE] UNCNTRL 06/04/2010  . CONSTIPATION 01/01/2010  . RECTAL PAIN 01/01/2010    Past Surgical History:  Procedure Laterality Date  . ANKLE SURGERY    . CHOLECYSTECTOMY  11/15/2011   Procedure: LAPAROSCOPIC CHOLECYSTECTOMY WITH INTRAOPERATIVE CHOLANGIOGRAM;  Surgeon: [name], MD;  Location: [facility];  Service: General;  Laterality: N/A;  . COLONOSCOPY    . COLONOSCOPY WITH PROPOFOL N/A 06/27/2017   Procedure: COLONOSCOPY WITH PROPOFOL;  Surgeon: Rachael Fee, MD;  Location: [facility];  Service: Endoscopy;  Laterality: N/A;  . ESOPHAGOGASTRODUODENOSCOPY  12/03/2011   Procedure: ESOPHAGOGASTRODUODENOSCOPY (EGD);  Surgeon: Theda Belfast, MD;  Location: [facility];  Service: Endoscopy;  Laterality: N/A;  . FLEXIBLE SIGMOIDOSCOPY N/A 03/10/2017   Procedure: FLEXIBLE SIGMOIDOSCOPY;  Surgeon: Jeani Hawking, MD;  Location: [facility];  Service: Endoscopy;  Laterality: N/A;  . INCISION AND DRAINAGE PERIRECTAL ABSCESS N/A 03/01/2017    Procedure: IRRIGATION AND DEBRIDEMENT PERIRECTAL ABSCESS;  Surgeon: Ovidio Kin, MD;  Location: [facility];  Service: General;  Laterality: N/A;  . IRRIGATION AND DEBRIDEMENT BUTTOCKS N/A 03/23/2017   Procedure: IRRIGATION AND DEBRIDEMENT BUTTOCKS, SETON PLACEMENT;  Surgeon: Romie Levee, MD;  Location: [facility];  Service: General;  Laterality: N/A;  . LAPAROSCOPY  11/23/2011   Procedure: LAPAROSCOPY DIAGNOSTIC;  Surgeon: Mariella Saa, MD;  Location: [facility];  Service: General;  Laterality: N/A;  . SIGMOIDOSCOPY    . UPPER GASTROINTESTINAL ENDOSCOPY    . WISDOM TOOTH EXTRACTION       OB History    Gravida  1   Para  1   Term      Preterm  1   AB      Living  1     SAB      TAB      Ectopic      Multiple  0   Live Births  1            Home Medications    Prior to Admission medications   Medication Sig Start Date End Date Taking? Authorizing Provider  promethazine (PHENERGAN) 12.5 MG tablet Take 12.5 mg by mouth 2 (two) times daily as needed for nausea/vomiting. 03/11/18  Yes [provider]  acetaminophen (TYLENOL) 500 MG tablet Take 1,000 mg by mouth daily as needed for moderate pain.     [provider]  hyoscyamine (LEVSIN SL) 0.125 MG SL tablet Place 1 tablet (0.125 mg total) under the tongue every 6 (six) hours as needed for cramping (abdominal pain). 02/20/18   Burnadette Pop, MD  insulin lispro (HUMALOG) 100 UNIT/ML injection Inject 0-0.09 mLs (0-9 Units total) into the skin 3 (three) times daily with meals. 02/20/18   Burnadette Pop, MD  LANTUS 100 UNIT/ML injection Inject 0.12 mLs (12 Units total) into the skin daily. 02/20/18   Burnadette Pop, MD  metoCLOPramide (REGLAN) 10 MG tablet Take 1 tablet (10 mg total) by mouth every 6 (six) hours as needed for nausea or vomiting. 02/20/18   Burnadette Pop, MD  mirtazapine (REMERON) 15 MG tablet TAKE 1 TABLET BY MOUTH AT  BEDTIME 05/04/18   Armbruster, Willaim Rayas, MD  ondansetron (ZOFRAN) 4 MG  tablet Take 1 tablet (4 mg total) by mouth every 8 (eight) hours as needed for nausea or vomiting (If necessary, dont combine with Reglan.). 03/25/18   Amin, Loura Halt, MD  pantoprazole (PROTONIX) 40 MG tablet TAKE 1 TABLET BY MOUTH  DAILY 05/04/18   Armbruster, Willaim Rayas, MD  promethazine (PHENERGAN) 12.5 MG suppository Place 12.5 mg rectally every 6 (six) hours as needed for nausea or vomiting.    [provider]    Family History Family History  Problem Relation Age of Onset  . Diabetes Mother   . Hypertension Father   . Colon cancer Paternal Grandmother        pt thinks PGM was dx in her 38's  . Esophageal cancer Neg Hx   . Liver cancer Neg Hx   . Pancreatic cancer Neg Hx   . Stomach cancer Neg Hx   . Rectal cancer Neg Hx  Social History Social History   Tobacco Use  . Smoking status: Never Smoker  . Smokeless tobacco: Never Used  Substance Use Topics  . Alcohol use: No    Alcohol/week: 0.0 standard drinks    Frequency: Never  . Drug use: No     Allergies   Other and Lactose intolerance (gi)   Review of Systems Review of Systems Ten systems reviewed and are negative for acute change, except as noted in the HPI.    Physical Exam Updated Vital Signs BP (!) 169/120   Pulse (!) 114   Temp 97.7 F (36.5 C) (Oral)   Resp (!) 22   Ht 5\' 7"  (1.702 m)   Wt 54.4 kg   LMP 04/13/2018 (Approximate)   SpO2 100%   BMI 18.79 kg/m   Physical Exam Vitals signs and nursing note reviewed.  Constitutional:      General: She is not in acute distress.    Appearance: She is well-developed. She is not diaphoretic.  HENT:     Head: Normocephalic and atraumatic.  Eyes:     General: No scleral icterus.    Conjunctiva/sclera: Conjunctivae normal.  Neck:     Musculoskeletal: Normal range of motion.  Cardiovascular:     Rate and Rhythm: Normal rate and regular rhythm.     Heart sounds: Normal heart sounds. No murmur. No friction rub. No gallop.   Pulmonary:      Effort: Pulmonary effort is normal. No respiratory distress.     Breath sounds: Normal breath sounds.  Abdominal:     General: Bowel sounds are normal. There is no distension.     Palpations: Abdomen is soft. There is no mass.     Tenderness: There is no abdominal tenderness. There is no guarding.  Skin:    General: Skin is warm and dry.  Neurological:     Mental Status: She is alert and oriented to person, place, and time.  Psychiatric:        Behavior: Behavior normal.      ED Treatments / Results  Labs (all labs ordered are listed, but only abnormal results are displayed) Labs Reviewed  COMPREHENSIVE METABOLIC PANEL - Abnormal; Notable for the following components:      Result Value   Potassium 3.4 (*)    Chloride 97 (*)    Glucose, Bld 255 (*)    Total Protein 8.8 (*)    All other components within normal limits  CBC - Abnormal; Notable for the following components:   Hemoglobin 11.8 (*)    HCT 35.7 (*)    All other components within normal limits  URINALYSIS, ROUTINE W REFLEX MICROSCOPIC - Abnormal; Notable for the following components:   Color, Urine STRAW (*)    Glucose, UA >=500 (*)    Hgb urine dipstick MODERATE (*)    Ketones, ur 5 (*)    Protein, ur 100 (*)    All other components within normal limits  CBG MONITORING, ED - Abnormal; Notable for the following components:   Glucose-Capillary 249 (*)    All other components within normal limits  LIPASE, BLOOD  I-STAT BETA HCG BLOOD, ED ([facility], [facility], [facility] ONLY)    EKG None  Radiology No results found.  Procedures Procedures (including critical care time)  Medications Ordered in ED Medications  sodium chloride 0.9 % bolus 2,000 mL (2,000 mLs Intravenous New Bag/Given 05/11/18 2046)  ondansetron (ZOFRAN-ODT) disintegrating tablet 4 mg (4 mg Oral Given 05/11/18 1555)  haloperidol lactate (HALDOL)  injection 2 mg (2 mg Intravenous Given 05/11/18 2113)     Initial Impression / Assessment and Plan / ED Course  I  have reviewed the triage vital signs and the nursing notes.  Pertinent labs & imaging results that were available during my care of the patient were reviewed by me and considered in my medical decision making (see chart for details).     28 year old female with history of type 1 diabetes and gastroparesis.  History is gathered by the patient, her significant other who is at bedside and review of EMR.  Blood sugar elevated however there is no evidence of DKA.  Negative pregnancy.  Lipase normal.No elevated white blood cell count.  Urine shows elevated protein and glucosuria.  Patient given 2 mg Haldol with resolution of vomiting however continued to have pain after 25 mcg of fentanyl.  Patient just received half milligram of Dilaudid.  She will need repeat evaluation for pain and vomiting.  Have given sign out to PA up still who will assume care.  I personally reviewed the patient's acute abdominal series which shows no signs of bowel obstruction.  She is hemodynamically stable throughout her visit here in the emergency department.  Final Clinical Impressions(s) / ED Diagnoses   Final diagnoses:  None    ED Discharge Orders    None       Arthor Captain, PA-C 05/12/18 0054    Virgina Norfolk, DO 05/12/18 0130

## 2018-05-12 ENCOUNTER — Encounter (HOSPITAL_COMMUNITY): Payer: Self-pay | Admitting: Family Medicine

## 2018-05-12 ENCOUNTER — Other Ambulatory Visit: Payer: Self-pay

## 2018-05-12 DIAGNOSIS — K3184 Gastroparesis: Secondary | ICD-10-CM | POA: Diagnosis not present

## 2018-05-12 DIAGNOSIS — R109 Unspecified abdominal pain: Secondary | ICD-10-CM

## 2018-05-12 DIAGNOSIS — E1069 Type 1 diabetes mellitus with other specified complication: Secondary | ICD-10-CM | POA: Diagnosis not present

## 2018-05-12 DIAGNOSIS — R1110 Vomiting, unspecified: Secondary | ICD-10-CM

## 2018-05-12 LAB — BASIC METABOLIC PANEL
Anion gap: 20 — ABNORMAL HIGH (ref 5–15)
BUN: 14 mg/dL (ref 6–20)
CO2: 18 mmol/L — ABNORMAL LOW (ref 22–32)
Calcium: 8.7 mg/dL — ABNORMAL LOW (ref 8.9–10.3)
Chloride: 98 mmol/L (ref 98–111)
Creatinine, Ser: 0.52 mg/dL (ref 0.44–1.00)
GFR calc Af Amer: >60 mL/min (ref >60)
GFR calc non Af Amer: >60 mL/min (ref >60)
Glucose, Bld: 290 mg/dL — ABNORMAL HIGH (ref 70–99)
Potassium: 3.5 mmol/L (ref 3.5–5.1)
Sodium: 136 mmol/L (ref 135–145)

## 2018-05-12 LAB — CBC WITH DIFFERENTIAL/PLATELET
Abs Immature Granulocytes: 0.06 10*3/uL (ref 0.00–0.07)
Basophils Absolute: 0 10*3/uL (ref 0.0–0.1)
Basophils Relative: 0 %
Eosinophils Absolute: 0 10*3/uL (ref 0.0–0.5)
Eosinophils Relative: 0 %
HCT: 35.6 % — ABNORMAL LOW (ref 36.0–46.0)
Hemoglobin: 11.6 g/dL — ABNORMAL LOW (ref 12.0–15.0)
Immature Granulocytes: 1 %
Lymphocytes Relative: 16 %
Lymphs Abs: 1 10*3/uL (ref 0.7–4.0)
MCH: 28.3 pg (ref 26.0–34.0)
MCHC: 32.6 g/dL (ref 30.0–36.0)
MCV: 86.8 fL (ref 80.0–100.0)
Monocytes Absolute: 0.1 10*3/uL (ref 0.1–1.0)
Monocytes Relative: 2 %
Neutro Abs: 5 10*3/uL (ref 1.7–7.7)
Neutrophils Relative %: 81 %
Platelets: 224 10*3/uL (ref 150–400)
RBC: 4.1 MIL/uL (ref 3.87–5.11)
RDW: 11.9 % (ref 11.5–15.5)
WBC: 6.1 10*3/uL (ref 4.0–10.5)
nRBC: 0 % (ref 0.0–0.2)

## 2018-05-12 LAB — GLUCOSE, CAPILLARY
Glucose-Capillary: 278 mg/dL — ABNORMAL HIGH (ref 70–99)
Glucose-Capillary: 229 mg/dL — ABNORMAL HIGH (ref 70–99)
Glucose-Capillary: 230 mg/dL — ABNORMAL HIGH (ref 70–99)
Glucose-Capillary: 195 mg/dL — ABNORMAL HIGH (ref 70–99)
Glucose-Capillary: 199 mg/dL — ABNORMAL HIGH (ref 70–99)

## 2018-05-12 LAB — MAGNESIUM: Magnesium: 1.6 mg/dL — ABNORMAL LOW (ref 1.7–2.4)

## 2018-05-12 MED ORDER — PROMETHAZINE HCL 25 MG/ML IJ SOLN
12.5000 mg | Freq: Once | INTRAMUSCULAR | Status: AC
  Administered 2018-05-12: 12.5 mg via INTRAVENOUS
  Filled 2018-05-12: qty 1

## 2018-05-12 MED ORDER — LABETALOL HCL 5 MG/ML IV SOLN
5.0000 mg | INTRAVENOUS | Status: DC | PRN
  Administered 2018-05-12 – 2018-05-15 (×9): 5 mg via INTRAVENOUS
  Filled 2018-05-12 (×11): qty 4

## 2018-05-12 MED ORDER — ACETAMINOPHEN 325 MG PO TABS: 650.0000 mg | ORAL_TABLET | Freq: Four times a day (QID) | ORAL | Status: DC | PRN

## 2018-05-12 MED ORDER — INSULIN ASPART 100 UNIT/ML ~~LOC~~ SOLN
0.0000 [IU] | SUBCUTANEOUS | Status: DC
  Administered 2018-05-12: 2 [IU] via SUBCUTANEOUS
  Administered 2018-05-12: 3 [IU] via SUBCUTANEOUS
  Administered 2018-05-12: 2 [IU] via SUBCUTANEOUS
  Administered 2018-05-12: 5 [IU] via SUBCUTANEOUS
  Administered 2018-05-12: 3 [IU] via SUBCUTANEOUS
  Administered 2018-05-13: 2 [IU] via SUBCUTANEOUS
  Administered 2018-05-13: 3 [IU] via SUBCUTANEOUS
  Administered 2018-05-13 (×2): 1 [IU] via SUBCUTANEOUS
  Administered 2018-05-13: 2 [IU] via SUBCUTANEOUS
  Administered 2018-05-14 (×3): 1 [IU] via SUBCUTANEOUS
  Administered 2018-05-16: 3 [IU] via SUBCUTANEOUS
  Administered 2018-05-16 – 2018-05-17 (×5): 1 [IU] via SUBCUTANEOUS
  Administered 2018-05-18: 3 [IU] via SUBCUTANEOUS
  Administered 2018-05-18 (×2): 1 [IU] via SUBCUTANEOUS
  Administered 2018-05-18: 3 [IU] via SUBCUTANEOUS

## 2018-05-12 MED ORDER — HYDROMORPHONE HCL 1 MG/ML IJ SOLN
0.5000 mg | Freq: Once | INTRAMUSCULAR | Status: AC
  Administered 2018-05-12: 0.5 mg via INTRAVENOUS
  Filled 2018-05-12: qty 1

## 2018-05-12 MED ORDER — ENOXAPARIN SODIUM 40 MG/0.4ML ~~LOC~~ SOLN
40.0000 mg | SUBCUTANEOUS | Status: DC
  Administered 2018-05-12 – 2018-05-18 (×7): 40 mg via SUBCUTANEOUS
  Filled 2018-05-12 (×7): qty 0.4

## 2018-05-12 MED ORDER — ACETAMINOPHEN 650 MG RE SUPP: 650.0000 mg | Freq: Four times a day (QID) | RECTAL | Status: DC | PRN

## 2018-05-12 MED ORDER — ONDANSETRON HCL 4 MG PO TABS: 4.0000 mg | ORAL_TABLET | Freq: Four times a day (QID) | ORAL | Status: DC | PRN

## 2018-05-12 MED ORDER — KETOROLAC TROMETHAMINE 30 MG/ML IJ SOLN
15.0000 mg | Freq: Four times a day (QID) | INTRAMUSCULAR | Status: DC | PRN
  Administered 2018-05-12 – 2018-05-14 (×4): 30 mg via INTRAVENOUS
  Filled 2018-05-12 (×5): qty 1

## 2018-05-12 MED ORDER — POTASSIUM CHLORIDE IN NACL 20-0.9 MEQ/L-% IV SOLN
INTRAVENOUS | Status: AC
  Administered 2018-05-12: 04:00:00 via INTRAVENOUS
  Filled 2018-05-12: qty 1000

## 2018-05-12 MED ORDER — MIRTAZAPINE 15 MG PO TABS
15.0000 mg | ORAL_TABLET | Freq: Every day | ORAL | Status: DC
  Administered 2018-05-13 – 2018-05-17 (×5): 15 mg via ORAL
  Filled 2018-05-12 (×6): qty 1

## 2018-05-12 MED ORDER — SODIUM CHLORIDE 0.9 % IV SOLN
INTRAVENOUS | Status: DC
  Administered 2018-05-12 – 2018-05-18 (×7): via INTRAVENOUS

## 2018-05-12 MED ORDER — INSULIN GLARGINE 100 UNIT/ML ~~LOC~~ SOLN
6.0000 [IU] | Freq: Every day | SUBCUTANEOUS | Status: DC
  Administered 2018-05-12: 6 [IU] via SUBCUTANEOUS
  Filled 2018-05-12: qty 0.06

## 2018-05-12 MED ORDER — HYDROMORPHONE HCL 1 MG/ML IJ SOLN
0.5000 mg | INTRAMUSCULAR | Status: DC | PRN
  Administered 2018-05-12 – 2018-05-13 (×5): 0.5 mg via INTRAVENOUS
  Filled 2018-05-12 (×5): qty 1

## 2018-05-12 MED ORDER — ONDANSETRON HCL 4 MG/2ML IJ SOLN
4.0000 mg | Freq: Four times a day (QID) | INTRAMUSCULAR | Status: DC | PRN
  Administered 2018-05-13 – 2018-05-14 (×3): 4 mg via INTRAVENOUS
  Filled 2018-05-12 (×4): qty 2

## 2018-05-12 MED ORDER — PANTOPRAZOLE SODIUM 40 MG PO TBEC
40.0000 mg | DELAYED_RELEASE_TABLET | Freq: Every day | ORAL | Status: DC
  Administered 2018-05-12: 40 mg via ORAL
  Filled 2018-05-12 (×2): qty 1

## 2018-05-12 MED ORDER — MAGNESIUM SULFATE 2 GM/50ML IV SOLN
2.0000 g | Freq: Once | INTRAVENOUS | Status: AC
  Administered 2018-05-12: 2 g via INTRAVENOUS
  Filled 2018-05-12: qty 50

## 2018-05-12 MED ORDER — HYOSCYAMINE SULFATE 0.125 MG SL SUBL
0.1250 mg | SUBLINGUAL_TABLET | Freq: Four times a day (QID) | SUBLINGUAL | Status: DC | PRN
  Administered 2018-05-12: 0.125 mg via SUBLINGUAL
  Filled 2018-05-12 (×2): qty 1

## 2018-05-12 MED ORDER — PROMETHAZINE HCL 25 MG/ML IJ SOLN
12.5000 mg | Freq: Four times a day (QID) | INTRAMUSCULAR | Status: DC | PRN
  Administered 2018-05-12 – 2018-05-13 (×3): 12.5 mg via INTRAVENOUS
  Filled 2018-05-12 (×3): qty 1

## 2018-05-12 MED ORDER — SODIUM CHLORIDE 0.9 % IV BOLUS
1000.0000 mL | Freq: Once | INTRAVENOUS | Status: AC
  Administered 2018-05-12: 1000 mL via INTRAVENOUS

## 2018-05-12 MED ORDER — KETOROLAC TROMETHAMINE 30 MG/ML IJ SOLN
30.0000 mg | Freq: Once | INTRAMUSCULAR | Status: AC
  Administered 2018-05-12: 30 mg via INTRAVENOUS
  Filled 2018-05-12: qty 1

## 2018-05-12 MED ORDER — METOCLOPRAMIDE HCL 5 MG/ML IJ SOLN
10.0000 mg | Freq: Three times a day (TID) | INTRAMUSCULAR | Status: DC
  Administered 2018-05-12 – 2018-05-13 (×5): 10 mg via INTRAVENOUS
  Filled 2018-05-12 (×5): qty 2

## 2018-05-12 MED ORDER — INSULIN GLARGINE 100 UNIT/ML ~~LOC~~ SOLN
9.0000 [IU] | Freq: Every day | SUBCUTANEOUS | Status: DC
  Administered 2018-05-13 – 2018-05-18 (×6): 9 [IU] via SUBCUTANEOUS
  Filled 2018-05-12 (×6): qty 0.09

## 2018-05-12 NOTE — Progress Notes (Signed)
Initial Nutrition Assessment  DOCUMENTATION CODES:   Non-severe (moderate) malnutrition in context of chronic illness  INTERVENTION:   Once diet is advanced, recommend Ensure MAX Protein po BID, each supplement provides 150 kcal and 30 grams of protein  NUTRITION DIAGNOSIS:   Moderate Malnutrition related to chronic illness(type 1 diabetes, gastroparesis) as evidenced by percent weight loss, energy intake < or equal to 75% for > or equal to 1 month, moderate muscle depletion.  GOAL:   Patient will meet greater than or equal to 90% of their needs  MONITOR:   Diet advancement, Labs, Weight trends, I & O's  REASON FOR ASSESSMENT:   Malnutrition Screening Tool    ASSESSMENT:   28 year old female with history of type 1 diabetes frequent hospital admissions for uncontrolled diabetes and gastroparesis admitted with chronic abdominal pain with nausea and vomiting.  Patient reports having N/V related to gastroparesis for 2 days PTA. Pt was unable to keep any food down during this time. Pt with multiple admissions over the past year for the same symptoms in which nausea medications do not help. Pt is now receiving nausea meds IV.  Currently NPO. No longer vomiting, is having looser stools. Recommend Ensure Max supplements once diet is advanced and tolerated.  Per weight records, pt has lost 20 lb since June 2019 (14% wt loss x 8 months, significant for time frame).  Medications: IV Reglan, Remeron tablet daily, IV Mg sulfate once, IV Phenergan   Labs reviewed: CBGs: 229-230 Low Mg   NUTRITION - FOCUSED PHYSICAL EXAM:    Most Recent Value  Orbital Region  No depletion  Upper Arm Region  No depletion  Thoracic and Lumbar Region  Unable to assess  Buccal Region  No depletion  Temple Region  Mild depletion  Clavicle Bone Region  Moderate depletion  Clavicle and Acromion Bone Region  Moderate depletion  Scapular Bone Region  Unable to assess  Dorsal Hand  Mild depletion   Patellar Region  Moderate depletion  Anterior Thigh Region  Unable to assess  Posterior Calf Region  Moderate depletion  Edema (RD Assessment)  None       Diet Order:   Diet Order            Diet NPO time specified Except for: [employer], Sips with Meds  Diet effective now              EDUCATION NEEDS:   No education needs have been identified at this time  Skin:  Skin Assessment: Reviewed RN Assessment  Last BM:  2/11  Height:   Ht Readings from Last 1 Encounters:  05/11/18 5\' 7"  (1.702 m)    Weight:   Wt Readings from Last 1 Encounters:  05/12/18 54.5 kg    Ideal Body Weight:  61.3 kg  BMI:  Body mass index is 18.82 kg/m.  Estimated Nutritional Needs:   Kcal:  1400-1600  Protein:  65-75g  Fluid:  1.6L/day   Tilda Franco, MS, RD, LDN [facility] Inpatient Clinical Dietitian Pager: [phone] After Hours Pager: [phone]

## 2018-05-12 NOTE — Progress Notes (Signed)
Patient continues to c/o intermittent abd pain rating 7 of 10. Patient requesting MD be notified to have an order to eat stating that she wants to try some jello. MD to be notified.

## 2018-05-12 NOTE — Plan of Care (Signed)
28 year old female with history of type 1 diabetes frequent hospital admissions for uncontrolled diabetes and gastroparesis admitted with chronic abdominal pain with nausea and vomiting.  Husband reports she has not had any further vomiting since coming up to the floor however she does have diarrhea.  She has history of chronic diarrhea and gastroparesis.  Patient has been referred to tertiary care center for gastric pacemaker.  I will continue her on IV hydration for today with symptomatic treatment with Zofran Reglan and Phenergan.  Patient is requesting something stronger for abdominal pain.  We will keep her on Dilaudid as needed while in hospital.

## 2018-05-12 NOTE — ED Notes (Signed)
ED TO INPATIENT HANDOFF REPORT  Name/Age/Gender [name] 28 y.o. female  Code Status    Code Status Orders  (From admission, onward)         Start     Ordered   05/12/18 0201  Full code  Continuous     05/12/18 0204        Code Status History    Date Active Date Inactive Code Status Order ID Comments User Context   03/22/2018 1242 03/25/2018 2040 Full Code [id number]  Merlene Laughter, DO Inpatient   02/18/2018 0230 02/20/2018 1937 Full Code [id number]  Therisa Doyne, MD ED   02/18/2018 0035 02/18/2018 0230 Full Code [id number]  Therisa Doyne, MD ED   12/19/2017 0305 12/25/2017 1830 Full Code [id number]  Hillary Bow, DO ED   07/27/2017 0340 08/01/2017 1716 Full Code [id number]  Eduard Clos, MD ED   07/10/2017 0441 07/13/2017 1908 Full Code [id number]  Briscoe Deutscher, MD ED   06/23/2017 2017 06/27/2017 2144 Full Code [id number]  Clydie Braun, MD ED   04/10/2017 1406 04/16/2017 1816 Full Code [id number]  Haydee Monica, MD Inpatient   03/23/2017 1316 03/24/2017 1854 Full Code [id number]  Haydee Monica, MD Inpatient   03/17/2017 0050 03/21/2017 2352 Full Code [id number]  Eduard Clos, MD ED   02/26/2017 2259 03/14/2017 2221 Full Code [id number]  Jonah Blue, MD Inpatient   02/26/2017 2012 02/26/2017 2259 Full Code [id number]  Reyes Ivan, MD ED   01/26/2015 0513 01/29/2015 1812 Full Code [id number]  Roe Coombs, CNM Inpatient   01/24/2015 1021 01/26/2015 0513 Full Code [id number]  Barkley Bruns, RN Inpatient   01/16/2015 1930 01/24/2015 1021 Full Code [id number]  Marlis Edelson, CNM Inpatient   06/11/2013 0049 06/12/2013 1340 Full Code [id number]  Ron Parker, MD ED   04/28/2013 0319 04/29/2013 1814 Full Code [id number]  Eduard Clos, MD Inpatient   09/01/2012 0749 09/02/2012 1159 Full Code [id number]  Tarry Kos, MD Inpatient   03/15/2012 2346 03/16/2012 1646 Full Code [id number]  Imogene Burn, RN Inpatient    02/28/2012 1657 03/01/2012 1921 Full Code [id number]  Brantley Persons, RN Inpatient   11/23/2011 0336 12/06/2011 2004 Full Code [id number]  Lorraine Lax, RN Inpatient   09/18/2011 1007 09/20/2011 2050 Full Code [id number]  Kathryn Chaco, NP Inpatient      Home/SNF/Other Home  Chief Complaint nausea; back pain;chest pain  Level of Care/Admitting Diagnosis ED Disposition    ED Disposition Condition Comment   Admit  Hospital Area: [facility] [[id number]]  Level of Care: Med-Surg [16]  Diagnosis: Abdominal pain with vomiting [5997741]  Admitting Physician: Briscoe Deutscher [[id number]]  Attending Physician: Briscoe Deutscher [[id number]]  PT Class (Do Not Modify): Observation [104]  PT Acc Code (Do Not Modify): Observation [10022]       Medical History Past Medical History:  Diagnosis Date  . Allergy   . Anemia   . Anxiety   . Blood transfusion without reported diagnosis    Dec 2018  . Cataract    right eye  . Depression   . Diabetes type 1, uncontrolled (HCC) 11/14/2011   Since age 50  . Fibromyalgia   . Gastroparesis   . GERD (gastroesophageal reflux disease)   . Hypertension   . Infection    UTI April 2016    Allergies Allergies  Allergen Reactions  . Other Anaphylaxis    Reaction to [place] nuts   .  Lactose Intolerance (Gi) Diarrhea    IV Location/Drains/Wounds Patient Lines/Drains/Airways Status   Active Line/Drains/Airways    Name:   Placement date:   Placement time:   Site:   Days:   Peripheral IV 05/11/18 Right Antecubital   05/11/18    2013    Antecubital   1          Labs/Imaging Results for orders placed or performed during the hospital encounter of 05/11/18 (from the past 48 hour(s))  Lipase, blood     Status: None   Collection Time: 05/11/18  3:53 PM  Result Value Ref Range   Lipase 25 11 - 51 U/L    Comment: Performed at [facility], [address] [place], [place] [address]  Comprehensive metabolic panel      Status: Abnormal   Collection Time: 05/11/18  3:53 PM  Result Value Ref Range   Sodium 139 135 - 145 mmol/L   Potassium 3.4 (L) 3.5 - 5.1 mmol/L   Chloride 97 (L) 98 - 111 mmol/L   CO2 30 22 - 32 mmol/L   Glucose, Bld 255 (H) 70 - 99 mg/dL   BUN 13 6 - 20 mg/dL   Creatinine, Ser 0.35 0.44 - 1.00 mg/dL   Calcium 9.8 8.9 - 00.9 mg/dL   Total Protein 8.8 (H) 6.5 - 8.1 g/dL   Albumin 4.1 3.5 - 5.0 g/dL   AST 25 15 - 41 U/L   ALT 21 0 - 44 U/L   Alkaline Phosphatase 99 38 - 126 U/L   Total Bilirubin 0.7 0.3 - 1.2 mg/dL   GFR calc non Af Amer >60 >60 mL/min   GFR calc Af Amer >60 >60 mL/min   Anion gap 12 5 - 15    Comment: Performed at [facility], [address], [place], [place] [address]  CBC     Status: Abnormal   Collection Time: 05/11/18  3:53 PM  Result Value Ref Range   WBC 4.6 4.0 - 10.5 K/uL   RBC 4.29 3.87 - 5.11 MIL/uL   Hemoglobin 11.8 (L) 12.0 - 15.0 g/dL   HCT 99.3 (L) 71.6 - 96.7 %   MCV 83.2 80.0 - 100.0 fL   MCH 27.5 26.0 - 34.0 pg   MCHC 33.1 30.0 - 36.0 g/dL   RDW 89.3 81.0 - 17.5 %   Platelets 228 150 - 400 K/uL   nRBC 0.0 0.0 - 0.2 %    Comment: Performed at [facility], [address] [place], [place] [address]  Urinalysis, Routine w reflex microscopic     Status: Abnormal   Collection Time: 05/11/18  3:53 PM  Result Value Ref Range   Color, Urine STRAW (A) YELLOW   APPearance CLEAR CLEAR   Specific Gravity, Urine 1.028 1.005 - 1.030   pH 7.0 5.0 - 8.0   Glucose, UA >=500 (A) NEGATIVE mg/dL   Hgb urine dipstick MODERATE (A) NEGATIVE   Bilirubin Urine NEGATIVE NEGATIVE   Ketones, ur 5 (A) NEGATIVE mg/dL   Protein, ur 527 (A) NEGATIVE mg/dL   Nitrite NEGATIVE NEGATIVE   Leukocytes,Ua NEGATIVE NEGATIVE   RBC / HPF 21-50 0 - 5 RBC/hpf   WBC, UA 0-5 0 - 5 WBC/hpf   Bacteria, UA NONE SEEN NONE SEEN   Squamous Epithelial / LPF 0-5 0 - 5    Comment: Performed at [facility], [address] [place], [place] [address]  CBG monitoring, ED  Status: Abnormal   Collection Time: 05/11/18  8:14 PM  Result Value Ref Range   Glucose-Capillary 249 (H) 70 - 99 mg/dL  I-Stat beta hCG blood, ED     Status: None   Collection Time: 05/11/18  8:21 PM  Result Value Ref Range   I-stat hCG, quantitative <5.0 <5 mIU/mL   Comment 3            Comment:   GEST. AGE      CONC.  (mIU/mL)   <=1 WEEK        5 - 50     2 WEEKS       50 - 500     3 WEEKS       100 - 10,000     4 WEEKS     1,000 - 30,000        FEMALE AND NON-PREGNANT FEMALE:     LESS THAN 5 mIU/mL    Dg Abd Acute 2+v W 1v Chest  Result Date: 05/12/2018 CLINICAL DATA:  Abdominal pain and vomiting EXAM: DG ABDOMEN ACUTE W/ 1V CHEST COMPARISON:  03/26/2018 FINDINGS: Cardiac shadow is within normal limits. The lungs are well aerated bilaterally. No focal infiltrate or sizable effusion is seen. Scattered large and small bowel gas is noted. No free air is seen. No abnormal mass or abnormal calcifications are noted. No acute bony abnormality is seen. IMPRESSION: No acute abnormality noted. Electronically Signed   By: Alcide Clever M.D.   On: 05/12/2018 00:02    Pending Labs Unresulted Labs (From admission, onward)    Start     Ordered   05/19/18 0500  Creatinine, serum  (enoxaparin (LOVENOX)    CrCl >/= 30 ml/min)  Weekly,   R    Comments:  while on enoxaparin therapy    05/12/18 0204   05/12/18 0500  Basic metabolic panel  Tomorrow morning,   R     05/12/18 0204   05/12/18 0500  Magnesium  Tomorrow morning,   R     05/12/18 0204   05/12/18 0500  CBC WITH DIFFERENTIAL  Tomorrow morning,   R     05/12/18 0204          Vitals/Pain Today's Vitals   05/11/18 2245 05/12/18 0011 05/12/18 0123 05/12/18 0129  BP:    (!) 170/106  Pulse: (!) 104   (!) 110  Resp:    19  Temp:      TempSrc:      SpO2:    100%  Weight:      Height:      PainSc:  9  9      Isolation Precautions No active isolations  Medications Medications   sodium chloride 0.9 % bolus 1,000 mL (1,000 mLs Intravenous New Bag/Given 05/12/18 0157)  mirtazapine (REMERON) tablet 15 mg (has no administration in time range)  hyoscyamine (LEVSIN SL) SL tablet 0.125 mg (has no administration in time range)  pantoprazole (PROTONIX) EC tablet 40 mg (has no administration in time range)  insulin glargine (LANTUS) injection 6 Units (has no administration in time range)  insulin aspart (novoLOG) injection 0-9 Units (has no administration in time range)  enoxaparin (LOVENOX) injection 40 mg (has no administration in time range)  0.9 % NaCl with KCl 20 mEq/ L  infusion (has no administration in time range)  acetaminophen (TYLENOL) tablet 650 mg (has no administration in time range)    Or  acetaminophen (TYLENOL) suppository 650 mg (has no administration in  time range)  ondansetron (ZOFRAN) tablet 4 mg (has no administration in time range)    Or  ondansetron (ZOFRAN) injection 4 mg (has no administration in time range)  metoCLOPramide (REGLAN) injection 10 mg (has no administration in time range)  promethazine (PHENERGAN) injection 12.5 mg (has no administration in time range)  ketorolac (TORADOL) 30 MG/ML injection 15-30 mg (has no administration in time range)  labetalol (NORMODYNE,TRANDATE) injection 5 mg (has no administration in time range)  ondansetron (ZOFRAN-ODT) disintegrating tablet 4 mg (4 mg Oral Given 05/11/18 1555)  sodium chloride 0.9 % bolus 2,000 mL (0 mLs Intravenous Stopped 05/12/18 0201)  haloperidol lactate (HALDOL) injection 2 mg (2 mg Intravenous Given 05/11/18 2113)  fentaNYL (SUBLIMAZE) injection 50 mcg (50 mcg Intravenous Given 05/11/18 2241)  HYDROmorphone (DILAUDID) injection 0.5 mg (0.5 mg Intravenous Given 05/12/18 0011)  promethazine (PHENERGAN) injection 12.5 mg (12.5 mg Intravenous Given 05/12/18 0157)  HYDROmorphone (DILAUDID) injection 0.5 mg (0.5 mg Intravenous Given 05/12/18 0158)    Mobility walks

## 2018-05-12 NOTE — Progress Notes (Signed)
Inpatient Diabetes Program Recommendations  AACE/ADA: New Consensus Statement on Inpatient Glycemic Control (2015)  Target Ranges:  Prepandial:   less than 140 mg/dL      Peak postprandial:   less than 180 mg/dL (1-2 hours)      Critically ill patients:  140 - 180 mg/dL   Lab Results  Component Value Date   GLUCAP 229 (H) 05/12/2018   HGBA1C 12.0 (H) 02/19/2018    Review of Glycemic Control Results for PHOENIX, WILKIE (MRN [id number]) as of 05/12/2018 10:11  Ref. Range 05/11/2018 20:14 05/12/2018 04:59 05/12/2018 07:50  Glucose-Capillary Latest Ref Range: 70 - 99 mg/dL 751 (H) 700 (H) 174 (H)   Diabetes history: DM1 Outpatient Diabetes medications: Lantus 12 units + Humalog 0-9 units tid meal coverage Current orders for Inpatient glycemic control: Lantus 6 + Novolog sensitive correction q 4 hrs.  Inpatient Diabetes Program Recommendations:   -Increase Lantus to 80% home dose = 9 units.  Thank you, Billy Fischer. Harlon Kutner, RN, MSN, CDE  Diabetes Coordinator Inpatient Glycemic Control Team Team Pager [phone] (8am-5pm) 05/12/2018 10:13 AM

## 2018-05-12 NOTE — H&P (Signed)
History and Physical    [name] JFH:[id number] DOB: [birth year] DOA: 05/11/2018  PCP: Patient, No Pcp Per   Patient coming from: Home   Chief Complaint: Upper abdominal pain, N/V   HPI: [name] is a 28 y.o. female with medical history significant for type 1 diabetes mellitus, chronic abdominal pain, and frequent admissions related to uncontrolled glucose and gastroparesis, now presenting to the emergency department for evaluation of increase in her chronic abdominal pain along with nausea and recurrent vomiting.  Patient reports insidious worsening in her chronic upper abdominal pain over the past 2 days, becoming severe, and associated with nausea and nonbloody vomiting.  She has been unable to keep any food, liquids, or medications down for the past 2 days.  She describes the symptoms as very similar to her prior episodes for which she is seen frequently in the ED.  She denies any dysuria, hematemesis, melena, hematochezia, chest pain, shortness of breath, boils, wounds, or new rashes.  She follows with GI for gastroparesis and chronic diarrhea, symptoms have been difficult to control, and she has been referred to tertiary center for consideration of gastric pacemaker.  ED Course: Upon arrival to the ED, patient is found to be afebrile, saturating well on room air, tachycardic in the 110s, and with stable blood pressure.  Chemistry panel is notable for a glucose of 255 with normal bicarbonate and anion gap, and a slight hypokalemia.  CBC features a stable normocytic anemia.  Urinalysis not suggestive of infection.  KUB with no acute abnormalities.  Patient was given IV fluids, fentanyl, Dilaudid, Haldol, Zofran, Reglan, and Phenergan in the ED.  She continues to complain of severe nausea, is unable to tolerate her home medications due to this, and will be observed for further evaluation and management.  Review of Systems:  All other systems reviewed and apart from HPI, are  negative.  Past Medical History:  Diagnosis Date  . Allergy   . Anemia   . Anxiety   . Blood transfusion without reported diagnosis    Dec 2018  . Cataract    right eye  . Depression   . Diabetes type 1, uncontrolled ([place]) 11/14/2011   Since age 1  . Fibromyalgia   . Gastroparesis   . GERD (gastroesophageal reflux disease)   . Hypertension   . Infection    UTI April 2016    Past Surgical History:  Procedure Laterality Date  . ANKLE SURGERY    . CHOLECYSTECTOMY  11/15/2011   Procedure: LAPAROSCOPIC CHOLECYSTECTOMY WITH INTRAOPERATIVE CHOLANGIOGRAM;  Surgeon: [name], MD;  Location: [facility];  Service: General;  Laterality: N/A;  . COLONOSCOPY    . COLONOSCOPY WITH PROPOFOL N/A 06/27/2017   Procedure: COLONOSCOPY WITH PROPOFOL;  Surgeon: Milus Banister, MD;  Location: [facility];  Service: Endoscopy;  Laterality: N/A;  . ESOPHAGOGASTRODUODENOSCOPY  12/03/2011   Procedure: ESOPHAGOGASTRODUODENOSCOPY (EGD);  Surgeon: Beryle Beams, MD;  Location: [facility];  Service: Endoscopy;  Laterality: N/A;  . FLEXIBLE SIGMOIDOSCOPY N/A 03/10/2017   Procedure: FLEXIBLE SIGMOIDOSCOPY;  Surgeon: Carol Ada, MD;  Location: [facility];  Service: Endoscopy;  Laterality: N/A;  . INCISION AND DRAINAGE PERIRECTAL ABSCESS N/A 03/01/2017   Procedure: IRRIGATION AND DEBRIDEMENT PERIRECTAL ABSCESS;  Surgeon: Alphonsa Overall, MD;  Location: [facility];  Service: General;  Laterality: N/A;  . IRRIGATION AND DEBRIDEMENT BUTTOCKS N/A 03/23/2017   Procedure: IRRIGATION AND DEBRIDEMENT BUTTOCKS, SETON PLACEMENT;  Surgeon: [name], MD;  Location: [facility];  Service: General;  Laterality: N/A;  . LAPAROSCOPY  11/23/2011   Procedure: LAPAROSCOPY DIAGNOSTIC;  Surgeon: Edward Jolly, MD;  Location: [facility];  Service: General;  Laterality: N/A;  . SIGMOIDOSCOPY    . UPPER GASTROINTESTINAL ENDOSCOPY    . WISDOM TOOTH EXTRACTION       reports that she has never smoked. She has never used  smokeless tobacco. She reports that she does not drink alcohol or use drugs.  Allergies  Allergen Reactions  . Other Anaphylaxis    Reaction to [place] nuts   . Lactose Intolerance (Gi) Diarrhea    Family History  Problem Relation Age of Onset  . Diabetes Mother   . Hypertension Father   . Colon cancer Paternal Grandmother        pt thinks PGM was dx in her 34's  . Esophageal cancer Neg Hx   . Liver cancer Neg Hx   . Pancreatic cancer Neg Hx   . Stomach cancer Neg Hx   . Rectal cancer Neg Hx      Prior to Admission medications   Medication Sig Start Date End Date Taking? Authorizing Provider  hyoscyamine (LEVSIN SL) 0.125 MG SL tablet Place 1 tablet (0.125 mg total) under the tongue every 6 (six) hours as needed for cramping (abdominal pain). 02/20/18  Yes Adhikari, Tamsen Meek, MD  insulin lispro (HUMALOG) 100 UNIT/ML injection Inject 0-0.09 mLs (0-9 Units total) into the skin 3 (three) times daily with meals. 02/20/18  Yes Adhikari, Tamsen Meek, MD  LANTUS 100 UNIT/ML injection Inject 0.12 mLs (12 Units total) into the skin daily. 02/20/18  Yes Shelly Coss, MD  metoCLOPramide (REGLAN) 10 MG tablet Take 1 tablet (10 mg total) by mouth every 6 (six) hours as needed for nausea or vomiting. 02/20/18  Yes Shelly Coss, MD  mirtazapine (REMERON) 15 MG tablet TAKE 1 TABLET BY MOUTH AT  BEDTIME 05/04/18  Yes Armbruster, Carlota Raspberry, MD  ondansetron (ZOFRAN) 4 MG tablet Take 1 tablet (4 mg total) by mouth every 8 (eight) hours as needed for nausea or vomiting (If necessary, dont combine with Reglan.). 03/25/18  Yes Amin, Jeanella Flattery, MD  pantoprazole (PROTONIX) 40 MG tablet TAKE 1 TABLET BY MOUTH  DAILY 05/04/18  Yes Armbruster, Carlota Raspberry, MD  promethazine (PHENERGAN) 12.5 MG tablet Take 12.5 mg by mouth 2 (two) times daily as needed for nausea/vomiting. 03/11/18  Yes [provider]    Physical Exam: Vitals:   05/11/18 2200 05/11/18 2230 05/11/18 2245 05/12/18 0129  BP: (!) 153/103 (!)  163/103  (!) 170/106  Pulse: (!) 111 (!) 104 (!) 104 (!) 110  Resp: 16 19  19   Temp:      TempSrc:      SpO2: 100% 100%  100%  Weight:      Height:        Constitutional: NAD, calm  Eyes: PERTLA, lids and conjunctivae normal ENMT: Mucous membranes are moist. Posterior pharynx clear of any exudate or lesions.   Neck: normal, supple, no masses, no thyromegaly Respiratory: clear to auscultation bilaterally, no wheezing, no crackles. Normal respiratory effort.   Cardiovascular: Rate ~110 and regular. No extremity edema.   Abdomen: No distension, no tenderness, no masses palpated. Bowel sounds normal.  Musculoskeletal: no clubbing / cyanosis. No joint deformity upper and lower extremities.   Skin: no significant rashes, lesions, ulcers. Poor turgor. Neurologic: CN 2-12 grossly intact. Sensation intact. Strength 5/5 in all 4 limbs.  Psychiatric: Alert and oriented x 3. Calm, cooperative.    Labs  on Admission: I have personally reviewed following labs and imaging studies  CBC: Recent Labs  Lab 05/11/18 1553  WBC 4.6  HGB 11.8*  HCT 35.7*  MCV 83.2  PLT 381   Basic Metabolic Panel: Recent Labs  Lab 05/11/18 1553  NA 139  K 3.4*  CL 97*  CO2 30  GLUCOSE 255*  BUN 13  CREATININE 0.50  CALCIUM 9.8   GFR: Estimated Creatinine Clearance: 89.9 mL/min (by C-G formula based on SCr of 0.5 mg/dL). Liver Function Tests: Recent Labs  Lab 05/11/18 1553  AST 25  ALT 21  ALKPHOS 99  BILITOT 0.7  PROT 8.8*  ALBUMIN 4.1   Recent Labs  Lab 05/11/18 1553  LIPASE 25   No results for input(s): AMMONIA in the last 168 hours. Coagulation Profile: No results for input(s): INR, PROTIME in the last 168 hours. Cardiac Enzymes: No results for input(s): CKTOTAL, CKMB, CKMBINDEX, TROPONINI in the last 168 hours. BNP (last 3 results) No results for input(s): PROBNP in the last 8760 hours. HbA1C: No results for input(s): HGBA1C in the last 72 hours. CBG: Recent Labs  Lab  05/11/18 2014  GLUCAP 249*   Lipid Profile: No results for input(s): CHOL, HDL, LDLCALC, TRIG, CHOLHDL, LDLDIRECT in the last 72 hours. Thyroid Function Tests: No results for input(s): TSH, T4TOTAL, FREET4, T3FREE, THYROIDAB in the last 72 hours. Anemia Panel: No results for input(s): VITAMINB12, FOLATE, FERRITIN, TIBC, IRON, RETICCTPCT in the last 72 hours. Urine analysis:    Component Value Date/Time   COLORURINE STRAW (A) 05/11/2018 1553   APPEARANCEUR CLEAR 05/11/2018 1553   LABSPEC 1.028 05/11/2018 1553   PHURINE 7.0 05/11/2018 1553   GLUCOSEU >=500 (A) 05/11/2018 1553   HGBUR MODERATE (A) 05/11/2018 1553   BILIRUBINUR NEGATIVE 05/11/2018 1553   BILIRUBINUR negative 01/02/2015 1717   KETONESUR 5 (A) 05/11/2018 1553   PROTEINUR 100 (A) 05/11/2018 1553   UROBILINOGEN 0.2 01/13/2015 0223   NITRITE NEGATIVE 05/11/2018 1553   LEUKOCYTESUR NEGATIVE 05/11/2018 1553   Sepsis Labs: @LABRCNTIP (procalcitonin:4,lacticidven:4) )No results found for this or any previous visit (from the past 240 hour(s)).   Radiological Exams on Admission: Dg Abd Acute 2+v W 1v Chest  Result Date: 05/12/2018 CLINICAL DATA:  Abdominal pain and vomiting EXAM: DG ABDOMEN ACUTE W/ 1V CHEST COMPARISON:  03/26/2018 FINDINGS: Cardiac shadow is within normal limits. The lungs are well aerated bilaterally. No focal infiltrate or sizable effusion is seen. Scattered large and small bowel gas is noted. No free air is seen. No abnormal mass or abnormal calcifications are noted. No acute bony abnormality is seen. IMPRESSION: No acute abnormality noted. Electronically Signed   By: Inez Catalina M.D.   On: 05/12/2018 00:02    EKG: Not performed.   Assessment/Plan   1. Abdominal pain with nausea and vomiting  - Presents with 2 days of increase in her chronic abdominal pain, accompanied by N/V and very similar to many episodes that she has had, likely gastroparesis   - Exam is benign, KUB unremarkable, renal function  preserved, slight hypokalemia noted  - She continues to have nausea and unable to take her oral medications despite treatment with Zofran, Reglan, Phenergan, and Haldol in ED  - Likely gastroparesis  - Continue IVF hydration, scheduled Reglan, as-needed Zofran, Phenergan for refractory N/V, monitor lytes, trial diet once improving   2. Insulin-dependent DM  - A1c was 12% in November 2019  - Managed at home with Lantus 12 units qD and Humalog 0-9 units TID  -  Continue Lantus and use Novolog SSI while in hospital    DVT prophylaxis: Lovenox  Code Status: Full  Family Communication: Discussed with patient  Consults called: None Admission status: Observation     Vianne Bulls, MD [facility] Pager [phone]  If 7PM-7AM, please contact night-coverage [email or web address] Password [email or web address]  05/12/2018, 2:04 AM

## 2018-05-12 NOTE — ED Provider Notes (Signed)
Patient care signed out at end of shift by Huntley Dec, PA-C. Patient with Type 1 DM Everardo All), gastroparesis ([place]), with symptoms of abdominal pain, nausea and vomiting that feels the same as previous episodes gastroparesis. Here she has received zofran, Fentanyl, haldol. She continues to have pain and vomiting. She has had 2 L IVF's and remains tachycardic. No evidence DKA.    Feel with persistent symptoms, patient would benefit from admission. Hospitalist paged. Patient agrees with plan of care.    Elpidio Anis, PA-C 05/12/18 0147    Virgina Norfolk, DO 05/12/18 [id number]

## 2018-05-12 NOTE — ED Notes (Signed)
Report given to Vera, RN

## 2018-05-12 NOTE — Progress Notes (Signed)
Patient crying states she is having "stomach pain". Torodol extra dose has been given per previous MD order for c/o pain but patient states no relief. MD paged at this time further orders pending.

## 2018-05-13 DIAGNOSIS — R109 Unspecified abdominal pain: Secondary | ICD-10-CM | POA: Diagnosis not present

## 2018-05-13 DIAGNOSIS — Z833 Family history of diabetes mellitus: Secondary | ICD-10-CM | POA: Diagnosis not present

## 2018-05-13 DIAGNOSIS — K529 Noninfective gastroenteritis and colitis, unspecified: Secondary | ICD-10-CM | POA: Diagnosis not present

## 2018-05-13 DIAGNOSIS — Z79899 Other long term (current) drug therapy: Secondary | ICD-10-CM | POA: Diagnosis not present

## 2018-05-13 DIAGNOSIS — R112 Nausea with vomiting, unspecified: Secondary | ICD-10-CM

## 2018-05-13 DIAGNOSIS — K219 Gastro-esophageal reflux disease without esophagitis: Secondary | ICD-10-CM | POA: Diagnosis present

## 2018-05-13 DIAGNOSIS — E440 Moderate protein-calorie malnutrition: Secondary | ICD-10-CM | POA: Diagnosis not present

## 2018-05-13 DIAGNOSIS — R9431 Abnormal electrocardiogram [ECG] [EKG]: Secondary | ICD-10-CM | POA: Diagnosis present

## 2018-05-13 DIAGNOSIS — G8929 Other chronic pain: Secondary | ICD-10-CM | POA: Diagnosis not present

## 2018-05-13 DIAGNOSIS — R1110 Vomiting, unspecified: Secondary | ICD-10-CM | POA: Diagnosis not present

## 2018-05-13 DIAGNOSIS — Z681 Body mass index (BMI) 19 or less, adult: Secondary | ICD-10-CM | POA: Diagnosis not present

## 2018-05-13 DIAGNOSIS — E739 Lactose intolerance, unspecified: Secondary | ICD-10-CM | POA: Diagnosis present

## 2018-05-13 DIAGNOSIS — K760 Fatty (change of) liver, not elsewhere classified: Secondary | ICD-10-CM | POA: Diagnosis present

## 2018-05-13 DIAGNOSIS — E1043 Type 1 diabetes mellitus with diabetic autonomic (poly)neuropathy: Secondary | ICD-10-CM | POA: Diagnosis not present

## 2018-05-13 DIAGNOSIS — I10 Essential (primary) hypertension: Secondary | ICD-10-CM | POA: Diagnosis not present

## 2018-05-13 DIAGNOSIS — K3184 Gastroparesis: Secondary | ICD-10-CM | POA: Diagnosis not present

## 2018-05-13 DIAGNOSIS — Z8249 Family history of ischemic heart disease and other diseases of the circulatory system: Secondary | ICD-10-CM | POA: Diagnosis not present

## 2018-05-13 DIAGNOSIS — E876 Hypokalemia: Secondary | ICD-10-CM | POA: Diagnosis not present

## 2018-05-13 DIAGNOSIS — M797 Fibromyalgia: Secondary | ICD-10-CM | POA: Diagnosis present

## 2018-05-13 DIAGNOSIS — R000 Tachycardia, unspecified: Secondary | ICD-10-CM | POA: Diagnosis present

## 2018-05-13 DIAGNOSIS — Z794 Long term (current) use of insulin: Secondary | ICD-10-CM | POA: Diagnosis not present

## 2018-05-13 DIAGNOSIS — D649 Anemia, unspecified: Secondary | ICD-10-CM | POA: Diagnosis not present

## 2018-05-13 LAB — GLUCOSE, CAPILLARY
Glucose-Capillary: 116 mg/dL — ABNORMAL HIGH (ref 70–99)
Glucose-Capillary: 117 mg/dL — ABNORMAL HIGH (ref 70–99)
Glucose-Capillary: 127 mg/dL — ABNORMAL HIGH (ref 70–99)
Glucose-Capillary: 132 mg/dL — ABNORMAL HIGH (ref 70–99)
Glucose-Capillary: 161 mg/dL — ABNORMAL HIGH (ref 70–99)
Glucose-Capillary: 182 mg/dL — ABNORMAL HIGH (ref 70–99)
Glucose-Capillary: 210 mg/dL — ABNORMAL HIGH (ref 70–99)

## 2018-05-13 LAB — BASIC METABOLIC PANEL
Anion gap: 6 (ref 5–15)
BUN: 26 mg/dL — ABNORMAL HIGH (ref 6–20)
CO2: 27 mmol/L (ref 22–32)
Calcium: 8.5 mg/dL — ABNORMAL LOW (ref 8.9–10.3)
Chloride: 109 mmol/L (ref 98–111)
Creatinine, Ser: 0.52 mg/dL (ref 0.44–1.00)
GFR calc Af Amer: >60 mL/min (ref >60)
GFR calc non Af Amer: >60 mL/min (ref >60)
Glucose, Bld: 144 mg/dL — ABNORMAL HIGH (ref 70–99)
Potassium: 3.1 mmol/L — ABNORMAL LOW (ref 3.5–5.1)
Sodium: 142 mmol/L (ref 135–145)

## 2018-05-13 LAB — MAGNESIUM: Magnesium: 2.3 mg/dL (ref 1.7–2.4)

## 2018-05-13 MED ORDER — SODIUM CHLORIDE 0.9 % IV SOLN
165.0000 mg | Freq: Once | INTRAVENOUS | Status: AC
  Administered 2018-05-13: 165 mg via INTRAVENOUS
  Filled 2018-05-13: qty 3.3

## 2018-05-13 MED ORDER — PANTOPRAZOLE SODIUM 40 MG IV SOLR
40.0000 mg | Freq: Two times a day (BID) | INTRAVENOUS | Status: DC
  Administered 2018-05-13 – 2018-05-16 (×7): 40 mg via INTRAVENOUS
  Filled 2018-05-13 (×8): qty 40

## 2018-05-13 MED ORDER — POTASSIUM CHLORIDE 10 MEQ/100ML IV SOLN
10.0000 meq | INTRAVENOUS | Status: AC
  Administered 2018-05-13 (×3): 10 meq via INTRAVENOUS
  Filled 2018-05-13 (×3): qty 100

## 2018-05-13 MED ORDER — HYDROMORPHONE HCL 1 MG/ML IJ SOLN
1.0000 mg | INTRAMUSCULAR | Status: DC | PRN
  Administered 2018-05-13 – 2018-05-18 (×49): 1 mg via INTRAVENOUS
  Filled 2018-05-13 (×49): qty 1

## 2018-05-13 MED ORDER — MAGNESIUM SULFATE 2 GM/50ML IV SOLN
2.0000 g | Freq: Once | INTRAVENOUS | Status: AC
  Administered 2018-05-13: 2 g via INTRAVENOUS
  Filled 2018-05-13: qty 50

## 2018-05-13 MED ORDER — PROMETHAZINE HCL 25 MG RE SUPP
12.5000 mg | Freq: Four times a day (QID) | RECTAL | Status: DC | PRN
  Administered 2018-05-13 – 2018-05-15 (×6): 12.5 mg via RECTAL
  Filled 2018-05-13 (×6): qty 1

## 2018-05-13 MED ORDER — IOHEXOL 300 MG/ML  SOLN: 30.0000 mL | Freq: Once | INTRAMUSCULAR | Status: DC | PRN

## 2018-05-13 NOTE — Progress Notes (Signed)
PROGRESS NOTE    [name]  HFG:[id number] DOB: 07/25/1990 DOA: 05/11/2018 PCP: Patient, No Pcp Per    Brief Narrative: 28 y.o. female with medical history significant for type 1 diabetes mellitus, chronic abdominal pain, and frequent admissions related to uncontrolled glucose and gastroparesis, now presenting to the emergency department for evaluation of increase in her chronic abdominal pain along with nausea and recurrent vomiting.  Patient reports insidious worsening in her chronic upper abdominal pain over the past 2 days, becoming severe, and associated with nausea and nonbloody vomiting.  She has been unable to keep any food, liquids, or medications down for the past 2 days.  She describes the symptoms as very similar to her prior episodes for which she is seen frequently in the ED.  She denies any dysuria, hematemesis, melena, hematochezia, chest pain, shortness of breath, boils, wounds, or new rashes.  She follows with GI for gastroparesis and chronic diarrhea, symptoms have been difficult to control, and she has been referred to tertiary center for consideration of gastric pacemaker.  ED Course: Upon arrival to the ED, patient is found to be afebrile, saturating well on room air, tachycardic in the 110s, and with stable blood pressure.  Chemistry panel is notable for a glucose of 255 with normal bicarbonate and anion gap, and a slight hypokalemia.  CBC features a stable normocytic anemia.  Urinalysis not suggestive of infection.  KUB with no acute abnormalities.  Patient was given IV fluids, fentanyl, Dilaudid, Haldol, Zofran, Reglan, and Phenergan in the ED.  She continues to complain of severe nausea, is unable to tolerate her home medications due to this, and will be observed for further evaluation and management.   Assessment & Plan:   Principal Problem:   Abdominal pain with vomiting Active Problems:   Diabetes mellitus type 1 ([place])  #1 diabetic gastroparesis with chronic  diarrhea now admitted with nausea vomiting and abdominal pain.  Appreciate GI input in this challenging case.  Patient is supposed to follow-up at [facility] for a gastric pacemaker.  Patient is requesting something to eat but at the same time complaining of 10 out of 10 pain in her abdomen.  However this morning physical exam her abdomen was soft and nontender.  Continue symptomatic treatment IV hydration.  Appreciate GI input again.  Staff reports that patient had vomiting overnight 4 times minimal amount.  #2 type 2 diabetes continue Lantus SSI.  #3 hypokalemia repleted.   Nutrition Problem: Moderate Malnutrition Etiology: chronic illness(type 1 diabetes, gastroparesis)     Signs/Symptoms: percent weight loss, energy intake < or equal to 75% for > or equal to 1 month, moderate muscle depletion Percent weight loss: 14 %(x 8 months)    Interventions: Refer to RD note for recommendations  Estimated body mass index is 18.97 kg/m as calculated from the following:   Height as of this encounter: 5' 7"  (1.702 m).   Weight as of this encounter: 54.9 kg.  DVT prophylaxis: Lovenox Code Status full code Family Communication: Discussed with patient Disposition Plan:.  pending any clinical improvement  Consultants: GI  Procedures: None Antimicrobials: None  Subjective: Continues to complain of 10 out of 10 abdominal pain crying and hollering out loud  Objective: Vitals:   05/12/18 1830 05/12/18 2129 05/13/18 0030 05/13/18 0450  BP: (!) 122/91 (!) 165/112 (!) 158/100 (!) 166/109  Pulse: (!) 113 (!) 107 (!) 105 (!) 108  Resp: 17 16  16   Temp: 98.3 F (36.8 C) 98.1 F (36.7  C)  98.3 F (36.8 C)  TempSrc: Oral Oral  Oral  SpO2: 100% 99%  98%  Weight:    54.9 kg  Height:        Intake/Output Summary (Last 24 hours) at 05/13/2018 1126 Last data filed at 05/12/2018 2129 Gross per 24 hour  Intake 595.42 ml  Output -  Net 595.42 ml   Filed Weights   05/11/18 1551 05/12/18  0404 05/13/18 0450  Weight: 54.4 kg 54.5 kg 54.9 kg    Examination:  General exam: Appears calm and comfortable  Respiratory system: Clear to auscultation. Respiratory effort normal. Cardiovascular system: S1 & S2 heard, RRR. No JVD, murmurs, rubs, gallops or clicks. No pedal edema. Gastrointestinal system: Abdomen is nondistended, soft and nontender. No organomegaly or masses felt. Normal bowel sounds heard. Central nervous system: Alert and oriented. No focal neurological deficits. Extremities: Symmetric 5 x 5 power. Skin: No rashes, lesions or ulcers Psychiatry: Judgement and insight appear normal. Mood & affect appropriate.     Data Reviewed: I have personally reviewed following labs and imaging studies  CBC: Recent Labs  Lab 05/11/18 1553 05/12/18 0455  WBC 4.6 6.1  NEUTROABS  --  5.0  HGB 11.8* 11.6*  HCT 35.7* 35.6*  MCV 83.2 86.8  PLT 228 201   Basic Metabolic Panel: Recent Labs  Lab 05/11/18 1553 05/12/18 0455 05/13/18 0703  NA 139 136 142  K 3.4* 3.5 3.1*  CL 97* 98 109  CO2 30 18* 27  GLUCOSE 255* 290* 144*  BUN 13 14 26*  CREATININE 0.50 0.52 0.52  CALCIUM 9.8 8.7* 8.5*  MG  --  1.6* 2.3   GFR: Estimated Creatinine Clearance: 90.7 mL/min (by C-G formula based on SCr of 0.52 mg/dL). Liver Function Tests: Recent Labs  Lab 05/11/18 1553  AST 25  ALT 21  ALKPHOS 99  BILITOT 0.7  PROT 8.8*  ALBUMIN 4.1   Recent Labs  Lab 05/11/18 1553  LIPASE 25   No results for input(s): AMMONIA in the last 168 hours. Coagulation Profile: No results for input(s): INR, PROTIME in the last 168 hours. Cardiac Enzymes: No results for input(s): CKTOTAL, CKMB, CKMBINDEX, TROPONINI in the last 168 hours. BNP (last 3 results) No results for input(s): PROBNP in the last 8760 hours. HbA1C: No results for input(s): HGBA1C in the last 72 hours. CBG: Recent Labs  Lab 05/12/18 1621 05/12/18 2020 05/13/18 0001 05/13/18 0437 05/13/18 0742  GLUCAP 195* 199*  210* 161* 116*   Lipid Profile: No results for input(s): CHOL, HDL, LDLCALC, TRIG, CHOLHDL, LDLDIRECT in the last 72 hours. Thyroid Function Tests: No results for input(s): TSH, T4TOTAL, FREET4, T3FREE, THYROIDAB in the last 72 hours. Anemia Panel: No results for input(s): VITAMINB12, FOLATE, FERRITIN, TIBC, IRON, RETICCTPCT in the last 72 hours. Sepsis Labs: No results for input(s): PROCALCITON, LATICACIDVEN in the last 168 hours.  No results found for this or any previous visit (from the past 240 hour(s)).       Radiology Studies: Dg Abd Acute 2+v W 1v Chest  Result Date: 05/12/2018 CLINICAL DATA:  Abdominal pain and vomiting EXAM: DG ABDOMEN ACUTE W/ 1V CHEST COMPARISON:  03/26/2018 FINDINGS: Cardiac shadow is within normal limits. The lungs are well aerated bilaterally. No focal infiltrate or sizable effusion is seen. Scattered large and small bowel gas is noted. No free air is seen. No abnormal mass or abnormal calcifications are noted. No acute bony abnormality is seen. IMPRESSION: No acute abnormality noted. Electronically Signed  By: Inez Catalina M.D.   On: 05/12/2018 00:02        Scheduled Meds: . enoxaparin (LOVENOX) injection  40 mg Subcutaneous Q24H  . insulin aspart  0-9 Units Subcutaneous Q4H  . insulin glargine  9 Units Subcutaneous Daily  . metoCLOPramide (REGLAN) injection  10 mg Intravenous Q8H  . mirtazapine  15 mg Oral QHS  . pantoprazole (PROTONIX) IV  40 mg Intravenous Q12H   Continuous Infusions: . sodium chloride 100 mL/hr at 05/13/18 0856     LOS: 0 days      Georgette Shell, MD [facility]  If 7PM-7AM, please contact night-coverage [email or web address] Password [facility] 05/13/2018, 11:26 AM

## 2018-05-13 NOTE — Consult Note (Addendum)
Consultation  Referring Provider: Dr. Zigmund Daniel     Primary Care Physician:  Patient, No Pcp Per Primary Gastroenterologist: Dr. Havery Moros       Reason for Consultation: Nausea, vomiting, abdominal pain, gastroparesis            HPI:   [name] is a 28 y.o. female with a history of diabetes and gastroparesis and multiple admissions, hypertension, chronic diarrhea, perirectal/perianal abscess who follows with Dr. Havery Moros and presented to the ER 05/12/2018 with abdominal pain, nausea and vomiting.    Last consult by our service 02/19/2018.  At that time describe doing well with pantoprazole 40 mg twice daily, Phenergan in the morning and evening and Carafate as well as Remeron 15 mg at bedtime.  At that time her PPI was increased to twice daily she was given Levsin standing order of 0.125 mg every 6 hours as well as Zofran every 6 hours.  She had an appointment at [facility] on 12/23.    Last A1c noted at 12 in November 2019.    Today, the patient describes that 3 days ago she started with an increase of her chronic abdominal pain accompanied by nausea and vomiting, similar to other episodes of gastroparesis.  Tells me this feels exactly the same as prior episodes.  Currently she cannot get comfortable at all having to sit up in bed.  Tells me that she is very very thirsty does not feel that she needs a CT.  Continues with diarrhea, nausea, vomiting and all of her regular symptoms during these episodes.  Does ask about a Phenergan suppository and a shower as sometimes these help.  Also describes being "very very thirsty".  Is tearful.    Tells me she has been taking all of her medications at home until she could not keep them down 2 days ago.    Denies fever, chills or any new symptoms.  ED course: KUB unremarkable, renal function preserved, slight hypokalemia  GI history: 04/21/2017 CT abdomen pelvis with contrast at [facility] [place]: Stranding and small amount of fluid  surrounding the drain immediately adjacent to the gluteal cleft on the right, likely phlegmon versus developing abscess along the superior lateral aspect of the drain, skin thickening and subcutaneous stranding extended to the anus/distal rectum where there was anal and rectal wall thickening, also mild bladder wall thickening and urothelial enhancement 04/01/2018 office visit Dr. Havery Moros: At that time discussed her longstanding type 1 diabetes with gastroparesis and chronic diarrhea.  At times she was taking 50 mg of Remeron nightly and Protonix 40 twice daily as well as Phenergan 12.5 mg twice a day and Zofran as needed.  Also Carafate as needed.  Only use Reglan with severe symptoms of this causes diarrhea.  Patient had missed her appointment with [facility] given hospitalization.;  At that time discussed that her QTC had fluctuated from normal to prolonged and he was hesitant to give her domperidone or increase Remeron, she does continue to work on control of her diabetes she was told to reschedule with [facility] in regards to possible gastric pacemaker,, diarrhea discussed and pancreatic fecal elastase and fecal lactoferrin discussed Abnormal GES in the past c/w gastroparesis Colonoscopy 06/27/2017 - normal colon and ileum- normal random biopsies Flex sig 03/10/2017 - poor prep, normal colon with normal biopsies EGD 12/03/2011 - not completed due to retained food EGD 10/05/2017 - retained gastric fluid which limited some views of body / fundus, otherwise normal,  no outlet obstruction  CT scan 03/23/2018 - distended stomach, mild transmural thickening of small and large bowel, mild hepatic steatosis CT angio scan 12/23/2017 - mild wall thickening of transverse colon, marked distension of bladder, body wall and mesenteric edema  Past Medical History:  Diagnosis Date  . Allergy   . Anemia   . Anxiety   . Blood transfusion without reported diagnosis    Dec 2018  . Cataract    right eye  .  Depression   . Diabetes type 1, uncontrolled ([place]) 11/14/2011   Since age 19  . Fibromyalgia   . Gastroparesis   . GERD (gastroesophageal reflux disease)   . Hypertension   . Infection    UTI April 2016    Past Surgical History:  Procedure Laterality Date  . ANKLE SURGERY    . CHOLECYSTECTOMY  11/15/2011   Procedure: LAPAROSCOPIC CHOLECYSTECTOMY WITH INTRAOPERATIVE CHOLANGIOGRAM;  Surgeon: [name], MD;  Location: [facility];  Service: General;  Laterality: N/A;  . COLONOSCOPY    . COLONOSCOPY WITH PROPOFOL N/A 06/27/2017   Procedure: COLONOSCOPY WITH PROPOFOL;  Surgeon: Milus Banister, MD;  Location: [facility];  Service: Endoscopy;  Laterality: N/A;  . ESOPHAGOGASTRODUODENOSCOPY  12/03/2011   Procedure: ESOPHAGOGASTRODUODENOSCOPY (EGD);  Surgeon: Beryle Beams, MD;  Location: [facility];  Service: Endoscopy;  Laterality: N/A;  . FLEXIBLE SIGMOIDOSCOPY N/A 03/10/2017   Procedure: FLEXIBLE SIGMOIDOSCOPY;  Surgeon: Carol Ada, MD;  Location: [facility];  Service: Endoscopy;  Laterality: N/A;  . INCISION AND DRAINAGE PERIRECTAL ABSCESS N/A 03/01/2017   Procedure: IRRIGATION AND DEBRIDEMENT PERIRECTAL ABSCESS;  Surgeon: Alphonsa Overall, MD;  Location: [facility];  Service: General;  Laterality: N/A;  . IRRIGATION AND DEBRIDEMENT BUTTOCKS N/A 03/23/2017   Procedure: IRRIGATION AND DEBRIDEMENT BUTTOCKS, SETON PLACEMENT;  Surgeon: [name], MD;  Location: [facility];  Service: General;  Laterality: N/A;  . LAPAROSCOPY  11/23/2011   Procedure: LAPAROSCOPY DIAGNOSTIC;  Surgeon: Edward Jolly, MD;  Location: [facility];  Service: General;  Laterality: N/A;  . SIGMOIDOSCOPY    . UPPER GASTROINTESTINAL ENDOSCOPY    . WISDOM TOOTH EXTRACTION      Family History  Problem Relation Age of Onset  . Diabetes Mother   . Hypertension Father   . Colon cancer Paternal Grandmother        pt thinks PGM was dx in her 55's  . Esophageal cancer Neg Hx   . Liver cancer Neg Hx   . Pancreatic  cancer Neg Hx   . Stomach cancer Neg Hx   . Rectal cancer Neg Hx      Social History   Tobacco Use  . Smoking status: Never Smoker  . Smokeless tobacco: Never Used  Substance Use Topics  . Alcohol use: No    Alcohol/week: 0.0 standard drinks    Frequency: Never  . Drug use: No    Prior to Admission medications   Medication Sig Start Date End Date Taking? Authorizing Provider  hyoscyamine (LEVSIN SL) 0.125 MG SL tablet Place 1 tablet (0.125 mg total) under the tongue every 6 (six) hours as needed for cramping (abdominal pain). 02/20/18  Yes Adhikari, Tamsen Meek, MD  insulin lispro (HUMALOG) 100 UNIT/ML injection Inject 0-0.09 mLs (0-9 Units total) into the skin 3 (three) times daily with meals. 02/20/18  Yes Adhikari, Tamsen Meek, MD  LANTUS 100 UNIT/ML injection Inject 0.12 mLs (12 Units total) into the skin daily. 02/20/18  Yes Shelly Coss, MD  metoCLOPramide (REGLAN) 10 MG tablet Take 1  tablet (10 mg total) by mouth every 6 (six) hours as needed for nausea or vomiting. 02/20/18  Yes Shelly Coss, MD  mirtazapine (REMERON) 15 MG tablet TAKE 1 TABLET BY MOUTH AT  BEDTIME 05/04/18  Yes Armbruster, Carlota Raspberry, MD  ondansetron (ZOFRAN) 4 MG tablet Take 1 tablet (4 mg total) by mouth every 8 (eight) hours as needed for nausea or vomiting (If necessary, dont combine with Reglan.). 03/25/18  Yes Amin, Jeanella Flattery, MD  pantoprazole (PROTONIX) 40 MG tablet TAKE 1 TABLET BY MOUTH  DAILY 05/04/18  Yes Armbruster, Carlota Raspberry, MD  promethazine (PHENERGAN) 12.5 MG tablet Take 12.5 mg by mouth 2 (two) times daily as needed for nausea/vomiting. 03/11/18  Yes [provider]    Current Facility-Administered Medications  Medication Dose Route Frequency Provider Last Rate Last Dose  . 0.9 %  sodium chloride infusion   Intravenous Continuous Georgette Shell, MD 100 mL/hr at 05/13/18 [phone]    . acetaminophen (TYLENOL) tablet 650 mg  650 mg Oral Q6H PRN Opyd, Ilene Qua, MD       Or  . acetaminophen  (TYLENOL) suppository 650 mg  650 mg Rectal Q6H PRN Opyd, Ilene Qua, MD      . enoxaparin (LOVENOX) injection 40 mg  40 mg Subcutaneous Q24H Opyd, Ilene Qua, MD   40 mg at 05/12/18 0959  . HYDROmorphone (DILAUDID) injection 1 mg  1 mg Intravenous Q2H PRN Georgette Shell, MD   1 mg at 05/13/18 0901  . hyoscyamine (LEVSIN SL) SL tablet 0.125 mg  0.125 mg Sublingual Q6H PRN Opyd, Ilene Qua, MD   0.125 mg at 05/12/18 0324  . insulin aspart (novoLOG) injection 0-9 Units  0-9 Units Subcutaneous Q4H Opyd, Ilene Qua, MD   2 Units at 05/13/18 0443  . insulin glargine (LANTUS) injection 9 Units  9 Units Subcutaneous Daily Georgette Shell, MD      . ketorolac (TORADOL) 30 MG/ML injection 15-30 mg  15-30 mg Intravenous Q6H PRN Opyd, Ilene Qua, MD   30 mg at 05/13/18 0236  . labetalol (NORMODYNE,TRANDATE) injection 5 mg  5 mg Intravenous Q2H PRN Opyd, Ilene Qua, MD   5 mg at 05/12/18 2230  . magnesium sulfate IVPB 2 g 50 mL  2 g Intravenous Once Georgette Shell, MD      . metoCLOPramide (REGLAN) injection 10 mg  10 mg Intravenous Q8H Opyd, Ilene Qua, MD   10 mg at 05/13/18 0443  . mirtazapine (REMERON) tablet 15 mg  15 mg Oral QHS Opyd, Ilene Qua, MD      . ondansetron (ZOFRAN) tablet 4 mg  4 mg Oral Q6H PRN Opyd, Ilene Qua, MD       Or  . ondansetron (ZOFRAN) injection 4 mg  4 mg Intravenous Q6H PRN Opyd, Ilene Qua, MD      . pantoprazole (PROTONIX) EC tablet 40 mg  40 mg Oral Daily Opyd, Ilene Qua, MD   40 mg at 05/12/18 1000  . potassium chloride 10 mEq in 100 mL IVPB  10 mEq Intravenous Q1 Hr x 3 Georgette Shell, MD      . promethazine (PHENERGAN) injection 12.5 mg  12.5 mg Intravenous Q6H PRN Opyd, Ilene Qua, MD   12.5 mg at 05/13/18 0751    Allergies as of 05/11/2018 - Review Complete 05/11/2018  Allergen Reaction Noted  . Other Anaphylaxis 12/13/2011  . Lactose intolerance (gi) Diarrhea 01/22/2015     Review of Systems:    Constitutional:  No weight loss, fever or  chills Skin: No rash  Cardiovascular: No chest pain Respiratory: No SOB Gastrointestinal: See HPI and otherwise negative Genitourinary: No dysuria  Neurological: No headache, dizziness or syncope Musculoskeletal: No new muscle or joint pain Hematologic: No bleeding  Psychiatric: No history of depression or anxiety    Physical Exam:  Vital signs in last 24 hours: Temp:  [97.7 F (36.5 C)-98.3 F (36.8 C)] 98.3 F (36.8 C) (02/13 0450) Pulse Rate:  [105-113] 108 (02/13 0450) Resp:  [16-19] 16 (02/13 0450) BP: (101-166)/(69-112) 166/109 (02/13 0450) SpO2:  [98 %-100 %] 98 % (02/13 0450) Weight:  [54.9 kg] 54.9 kg (02/13 0450) Last BM Date: 05/11/18 General:   Tearful, thin appearing, AA female appears to be in mild distress, Well developed, Well nourished, alert and cooperative Head:  Normocephalic and atraumatic. Eyes:   PEERL, EOMI. No icterus. Conjunctiva pink. Ears:  Normal auditory acuity. Neck:  Supple Throat: Oral cavity and pharynx without inflammation, swelling or lesion. . Lungs: Respirations even and unlabored. Lungs clear to auscultation bilaterally.   No wheezes, crackles, or rhonchi.  Heart: Normal S1, S2. No MRG. Regular rate and rhythm. No peripheral edema, cyanosis or pallor.  Abdomen:  Soft, nondistended, moderate generalized ttp, some worse in epigastrum, No rebound or guarding. Normal bowel sounds. No appreciable masses or hepatomegaly. Rectal:  Not performed.  Msk:  Symmetrical without gross deformities. Peripheral pulses intact.  Extremities:  Without edema, no deformity or joint abnormality. Normal ROM, normal sensation. Neurologic:  Alert and  oriented x4;  grossly normal neurologically.  Skin:   Dry and intact without significant lesions or rashes. Psychiatric: Demonstrates good judgement and reason without abnormal affect or behaviors.   LAB RESULTS: Recent Labs    05/11/18 1553 05/12/18 0455  WBC 4.6 6.1  HGB 11.8* 11.6*  HCT 35.7* 35.6*  PLT  228 224   BMET Recent Labs    05/11/18 1553 05/12/18 0455 05/13/18 0703  NA 139 136 142  K 3.4* 3.5 3.1*  CL 97* 98 109  CO2 30 18* 27  GLUCOSE 255* 290* 144*  BUN 13 14 26*  CREATININE 0.50 0.52 0.52  CALCIUM 9.8 8.7* 8.5*   LFT Recent Labs    05/11/18 1553  PROT 8.8*  ALBUMIN 4.1  AST 25  ALT 21  ALKPHOS 99  BILITOT 0.7   STUDIES: Dg Abd Acute 2+v W 1v Chest  Result Date: 05/12/2018 CLINICAL DATA:  Abdominal pain and vomiting EXAM: DG ABDOMEN ACUTE W/ 1V CHEST COMPARISON:  03/26/2018 FINDINGS: Cardiac shadow is within normal limits. The lungs are well aerated bilaterally. No focal infiltrate or sizable effusion is seen. Scattered large and small bowel gas is noted. No free air is seen. No abnormal mass or abnormal calcifications are noted. No acute bony abnormality is seen. IMPRESSION: No acute abnormality noted. Electronically Signed   By: Inez Catalina M.D.   On: 05/12/2018 00:02    Impression / Plan:   Impression: 1.  Gastroparesis: With abdominal pain, nausea and vomiting, the same as all of her other episodes during hospitalizations 2.  Insulin-dependent diabetes  Plan: 1.  Discussed with patient that we do need her to follow-up with [facility], she tells me she does not have appointment now until the end of next month, there she can discuss gastric pacemaker. 2.  Ordered for a shower and discussed with nurse.  Due to pain medications nurses concerned that she will not be stable on her feet, recommended  that she have a shower chair and a CNA to assist. 3.  Ordered Phenergan suppository, stopped Phenergan IV  4.  Will allow water and ice chips 5. Cancelled CT abdomen, she just had one end of January, this is similar to all of her other episodes, do not believe anything has changed.  6. Changed from Pantoprazole 73m PO qd to Pantoprazole 498mIV BID 7.  Please await further recommendations from Dr. ArHavery Morosater today.  Thank you for your kind  consultation, we will continue to follow.  JeLavone NianeRavine Way Surgery Center LLC2/13/2020, 9:29 AM

## 2018-05-13 NOTE — Progress Notes (Signed)
Patient ID: [name], female   DOB: 03/31/1991, 28 y.o.   MRN: [id number]   Brief GI update: EKG with QTc 459. Dr. Adela Lank recommended Erythromycin 165mg  IV x 1 dose today for gastroparesis,  repeat 12 lead EKG in am 05/14/2018 to recheck QTc.

## 2018-05-14 LAB — GLUCOSE, CAPILLARY
Glucose-Capillary: 137 mg/dL — ABNORMAL HIGH (ref 70–99)
Glucose-Capillary: 148 mg/dL — ABNORMAL HIGH (ref 70–99)
Glucose-Capillary: 139 mg/dL — ABNORMAL HIGH (ref 70–99)
Glucose-Capillary: 95 mg/dL (ref 70–99)
Glucose-Capillary: 89 mg/dL (ref 70–99)

## 2018-05-14 LAB — COMPREHENSIVE METABOLIC PANEL
ALT: 16 U/L (ref 0–44)
AST: 18 U/L (ref 15–41)
Albumin: 3.1 g/dL — ABNORMAL LOW (ref 3.5–5.0)
Alkaline Phosphatase: 69 U/L (ref 38–126)
Anion gap: 8 (ref 5–15)
BUN: 23 mg/dL — ABNORMAL HIGH (ref 6–20)
CO2: 25 mmol/L (ref 22–32)
Calcium: 8.4 mg/dL — ABNORMAL LOW (ref 8.9–10.3)
Chloride: 107 mmol/L (ref 98–111)
Creatinine, Ser: 0.44 mg/dL (ref 0.44–1.00)
GFR calc Af Amer: >60 mL/min (ref >60)
GFR calc non Af Amer: >60 mL/min (ref >60)
Glucose, Bld: 148 mg/dL — ABNORMAL HIGH (ref 70–99)
Potassium: 3.3 mmol/L — ABNORMAL LOW (ref 3.5–5.1)
Sodium: 140 mmol/L (ref 135–145)
Total Bilirubin: 1.5 mg/dL — ABNORMAL HIGH (ref 0.3–1.2)
Total Protein: 6.7 g/dL (ref 6.5–8.1)

## 2018-05-14 LAB — CBC
HCT: 30.2 % — ABNORMAL LOW (ref 36.0–46.0)
Hemoglobin: 9.5 g/dL — ABNORMAL LOW (ref 12.0–15.0)
MCH: 27.5 pg (ref 26.0–34.0)
MCHC: 31.5 g/dL (ref 30.0–36.0)
MCV: 87.5 fL (ref 80.0–100.0)
Platelets: 199 10*3/uL (ref 150–400)
RBC: 3.45 MIL/uL — ABNORMAL LOW (ref 3.87–5.11)
RDW: 12.3 % (ref 11.5–15.5)
WBC: 4.7 10*3/uL (ref 4.0–10.5)
nRBC: 0 % (ref 0.0–0.2)

## 2018-05-14 LAB — LIPASE, BLOOD: Lipase: 24 U/L (ref 11–51)

## 2018-05-14 MED ORDER — MAGNESIUM SULFATE 2 GM/50ML IV SOLN
2.0000 g | Freq: Once | INTRAVENOUS | Status: AC
  Administered 2018-05-14: 2 g via INTRAVENOUS
  Filled 2018-05-14: qty 50

## 2018-05-14 MED ORDER — ONDANSETRON HCL 4 MG PO TABS
4.0000 mg | ORAL_TABLET | Freq: Four times a day (QID) | ORAL | Status: DC
  Administered 2018-05-17 – 2018-05-18 (×7): 4 mg via ORAL
  Filled 2018-05-14 (×7): qty 1

## 2018-05-14 MED ORDER — KETOROLAC TROMETHAMINE 30 MG/ML IJ SOLN
30.0000 mg | Freq: Four times a day (QID) | INTRAMUSCULAR | Status: DC
  Administered 2018-05-14 – 2018-05-15 (×4): 30 mg via INTRAVENOUS
  Filled 2018-05-14 (×4): qty 1

## 2018-05-14 MED ORDER — SODIUM CHLORIDE 0.9 % IV SOLN
125.0000 mg | Freq: Four times a day (QID) | INTRAVENOUS | Status: DC
  Administered 2018-05-14 – 2018-05-15 (×4): 125 mg via INTRAVENOUS
  Filled 2018-05-14 (×8): qty 2.5

## 2018-05-14 MED ORDER — ONDANSETRON HCL 4 MG/2ML IJ SOLN
4.0000 mg | Freq: Four times a day (QID) | INTRAMUSCULAR | Status: DC
  Administered 2018-05-14 – 2018-05-16 (×10): 4 mg via INTRAVENOUS
  Filled 2018-05-14 (×10): qty 2

## 2018-05-14 NOTE — Progress Notes (Signed)
PROGRESS NOTE    [name]  ATF:[id number] DOB: 11-05-1990 DOA: 05/11/2018 PCP: Patient, No Pcp Per    Brief Narrative:28 y.o.femalewith medical history significant fortype 1 diabetes mellitus, chronic abdominal pain, and frequent admissions related to uncontrolled glucose and gastroparesis, now presenting to the emergency department for evaluation of increase in her chronic abdominal pain along with nausea and recurrent vomiting.Patient reports insidious worsening in her chronic upper abdominal pain over the past 2 days, becoming severe, and associated with nausea and nonbloody vomiting. She has been unable to keep any food, liquids, or medications down for the past 2 days. She describes the symptoms as very similar to her prior episodes for which she is seen frequently in the ED. She denies any dysuria, hematemesis, melena, hematochezia, chest pain, shortness of breath, boils, wounds, or new rashes. She follows with GI for gastroparesis and chronic diarrhea, symptoms have been difficult to control, and she has been referred to tertiary center for consideration of gastric pacemaker.  ED Course:Upon arrival to the ED, patient is found to be afebrile, saturating well on room air, tachycardic in the 110s, and with stable blood pressure. Chemistry panel is notable for a glucose of 255 with normal bicarbonate and anion gap, and a slight hypokalemia. CBC features a stable normocytic anemia. Urinalysis not suggestive of infection. KUB with no acute abnormalities. Patient was given IV fluids, fentanyl, Dilaudid, Haldol, Zofran, Reglan, and Phenergan in the ED. She continues to complain of severe nausea, is unable to tolerate her home medications due to this, and will be observed for further evaluation and management.  Assessment & Plan:    #1 diabetic gastroparesis patient reports overnight she did not have any vomiting will try Jell-O today.  However she has chronic diarrhea.  She got a  dose of erythromycin per GI recommendation.  Will try clear liquids and try advancing as tolerated.  Continue pain control IV fluids and symptomatic treatment.  #2 type 1 diabetes continue Lantus and SSI  3 hypokalemia replace and recheck. DVT prophylaxis: Lovenox Code Status full code Family Communication: Discussed with patient Disposition Plan:.  pending any clinical improvement  Consultants: GI  Procedures: None Antimicrobials: None  Principal Problem:   Abdominal pain with vomiting Active Problems:   Diabetes mellitus type 1 ([place])    Nutrition Problem: Moderate Malnutrition Etiology: chronic illness(type 1 diabetes, gastroparesis)     Signs/Symptoms: percent weight loss, energy intake < or equal to 75% for > or equal to 1 month, moderate muscle depletion Percent weight loss: 14 %(x 8 months)    Interventions: Refer to RD note for recommendations  Estimated body mass index is 19.97 kg/m as calculated from the following:   Height as of this encounter: 5' 7"  (1.702 m).   Weight as of this encounter: 57.8 kg.   Subjective: Patient resting in bed reports she did not vomit overnight however continues to have diarrhea continues to have pain taking Dilaudid and Phenergan   Objective: Vitals:   05/14/18 0046 05/14/18 0451 05/14/18 0500 05/14/18 0610  BP: 123/87 (!) 170/118  (!) 129/91  Pulse: (!) 101 (!) 110  99  Resp:  19    Temp:  97.8 F (36.6 C)    TempSrc:  Oral    SpO2:  99%    Weight:   57.8 kg   Height:        Intake/Output Summary (Last 24 hours) at 05/14/2018 1015 Last data filed at 05/14/2018 0500 Gross per 24 hour  Intake 2849.32  ml  Output 3 ml  Net 2846.32 ml   Filed Weights   05/12/18 0404 05/13/18 0450 05/14/18 0500  Weight: 54.5 kg 54.9 kg 57.8 kg    Examination:  General exam: Appears calm and comfortable  Respiratory system: Clear to auscultation. Respiratory effort normal. Cardiovascular system: S1 & S2 heard, RRR. No JVD,  murmurs, rubs, gallops or clicks. No pedal edema. Gastrointestinal system: Abdomen is nondistended, soft and NON TENDER . No organomegaly or masses felt. Normal bowel sounds heard. Central nervous system: Alert and oriented. No focal neurological deficits. Extremities: Symmetric 5 x 5 power. Skin: No rashes, lesions or ulcers Psychiatry: Judgement and insight appear normal. Mood & affect appropriate.     Data Reviewed: I have personally reviewed following labs and imaging studies  CBC: Recent Labs  Lab 05/11/18 1553 05/12/18 0455 05/14/18 0337  WBC 4.6 6.1 4.7  NEUTROABS  --  5.0  --   HGB 11.8* 11.6* 9.5*  HCT 35.7* 35.6* 30.2*  MCV 83.2 86.8 87.5  PLT 228 224 628   Basic Metabolic Panel: Recent Labs  Lab 05/11/18 1553 05/12/18 0455 05/13/18 0703 05/14/18 0337  NA 139 136 142 140  K 3.4* 3.5 3.1* 3.3*  CL 97* 98 109 107  CO2 30 18* 27 25  GLUCOSE 255* 290* 144* 148*  BUN 13 14 26* 23*  CREATININE 0.50 0.52 0.52 0.44  CALCIUM 9.8 8.7* 8.5* 8.4*  MG  --  1.6* 2.3  --    GFR: Estimated Creatinine Clearance: 95.5 mL/min (by C-G formula based on SCr of 0.44 mg/dL). Liver Function Tests: Recent Labs  Lab 05/11/18 1553 05/14/18 0337  AST 25 18  ALT 21 16  ALKPHOS 99 69  BILITOT 0.7 1.5*  PROT 8.8* 6.7  ALBUMIN 4.1 3.1*   Recent Labs  Lab 05/11/18 1553 05/14/18 0337  LIPASE 25 24   No results for input(s): AMMONIA in the last 168 hours. Coagulation Profile: No results for input(s): INR, PROTIME in the last 168 hours. Cardiac Enzymes: No results for input(s): CKTOTAL, CKMB, CKMBINDEX, TROPONINI in the last 168 hours. BNP (last 3 results) No results for input(s): PROBNP in the last 8760 hours. HbA1C: No results for input(s): HGBA1C in the last 72 hours. CBG: Recent Labs  Lab 05/13/18 1743 05/13/18 1956 05/13/18 2334 05/14/18 0448 05/14/18 0730  GLUCAP 132* 127* 117* 137* 148*   Lipid Profile: No results for input(s): CHOL, HDL, LDLCALC, TRIG,  CHOLHDL, LDLDIRECT in the last 72 hours. Thyroid Function Tests: No results for input(s): TSH, T4TOTAL, FREET4, T3FREE, THYROIDAB in the last 72 hours. Anemia Panel: No results for input(s): VITAMINB12, FOLATE, FERRITIN, TIBC, IRON, RETICCTPCT in the last 72 hours. Sepsis Labs: No results for input(s): PROCALCITON, LATICACIDVEN in the last 168 hours.  No results found for this or any previous visit (from the past 240 hour(s)).       Radiology Studies: No results found.      Scheduled Meds: . enoxaparin (LOVENOX) injection  40 mg Subcutaneous Q24H  . insulin aspart  0-9 Units Subcutaneous Q4H  . insulin glargine  9 Units Subcutaneous Daily  . mirtazapine  15 mg Oral QHS  . pantoprazole (PROTONIX) IV  40 mg Intravenous Q12H   Continuous Infusions: . sodium chloride 100 mL/hr at 05/14/18 1011     LOS: 1 day     Georgette Shell, MD [facility]  If 7PM-7AM, please contact night-coverage [email or web address] Password [facility] 05/14/2018, 10:15 AM

## 2018-05-14 NOTE — Progress Notes (Addendum)
     [facility] Progress Note  CC:  Gastroparesis, Insulin dependent diabetes  Subjective: vomited x 1 during the night and mid morning, able to keep some fluids down, had diarrhea this am, continue to have upper abdominal pain   Objective:  Vital signs in last 24 hours: Temp:  [97.8 F (36.6 C)-98 F (36.7 C)] 97.8 F (36.6 C) (02/14 0451) Pulse Rate:  [99-117] 99 (02/14 0610) Resp:  [15-19] 19 (02/14 0451) BP: (123-181)/(87-125) 129/91 (02/14 0610) SpO2:  [96 %-99 %] 99 % (02/14 0451) Weight:  [57.8 kg] 57.8 kg (02/14 0500) Last BM Date: 05/11/18 General: fatigued appearing in NAD Heart:  Regular rate and rhythm; no murmurs Pulm: lungs clear throughout.  Abdomen:  Soft, moderate tenderness throughout the upper abdomen without rebound or guarding, + BS x 4 quads. Extremities:  Without edema. Neurologic:  Alert and  oriented x4;  grossly normal neurologically. Psych:  Alert and cooperative. Normal mood and affect.  Intake/Output from previous day: 02/13 0701 - 02/14 0700 In: 2849.3 [P.O.:837; I.V.:2012.3] Out: 4 [Urine:4] Intake/Output this shift: No intake/output data recorded.  Lab Results: Recent Labs    05/11/18 1553 05/12/18 0455 05/14/18 0337  WBC 4.6 6.1 4.7  HGB 11.8* 11.6* 9.5*  HCT 35.7* 35.6* 30.2*  PLT 228 224 199   BMET Recent Labs    05/12/18 0455 05/13/18 0703 05/14/18 0337  NA 136 142 140  K 3.5 3.1* 3.3*  CL 98 109 107  CO2 18* 27 25  GLUCOSE 290* 144* 148*  BUN 14 26* 23*  CREATININE 0.52 0.52 0.44  CALCIUM 8.7* 8.5* 8.4*   LFT Recent Labs    05/14/18 0337  PROT 6.7  ALBUMIN 3.1*  AST 18  ALT 16  ALKPHOS 69  BILITOT 1.5*    Assessment / Plan:  1) Gastroparesis secondary to DM 1. History of prolonged QT interval. She received one dose Erythromycin 12m IV for gastroparesis 05/13/2018. EKG 05/13/2018 prior to Erythromycin  QTc  459. Repeat EKG 05/14/2018  QTc 453.  -further Erythromycin IV orders per Dr.  GCarlean Purl -Phenergan suppository as needed  -Zofran 478mIV Q 6 hours -continue clear liquids  [facility]   2) upper abdominal pain secondary to vomiting  -Toradol 3057mV Q 6 hrs for next  2 to 3 days, not to exceed 5 days -continue PPI IV  3) Hypokalemia: K+ 3.1 up to 3.3. IV fluid and KCL replacement per hospitalist   4) Anemia: Hg/HCT decreased most likely dilutional. Hg 9.5. Hct 30.2.  -monitor H/H  5) DM Type I -management per hospitalist   LOS: 1 day     ColNoralyn Pick/14/2020, 11:36 AM    [facility] Attending   I have taken an interval history, reviewed the chart and examined the patient. I agree with the Advanced Practitioner's note, impression and recommendations.   Still symptomatic with vomiting and pain. Seems like some liquids may stay down for a while.  Running Toradol and Zofran and will do erythromycin IV q 8 and monitor QTc  CarGatha MayerD, FACRocky Mountain Surgery Center LLCstroenterology 05/14/2018 1:19 PM Pager [phone]

## 2018-05-15 LAB — GLUCOSE, CAPILLARY
Glucose-Capillary: 107 mg/dL — ABNORMAL HIGH (ref 70–99)
Glucose-Capillary: 73 mg/dL (ref 70–99)
Glucose-Capillary: 89 mg/dL (ref 70–99)
Glucose-Capillary: 89 mg/dL (ref 70–99)
Glucose-Capillary: 93 mg/dL (ref 70–99)
Glucose-Capillary: 99 mg/dL (ref 70–99)

## 2018-05-15 LAB — BASIC METABOLIC PANEL
Anion gap: 10 (ref 5–15)
BUN: 13 mg/dL (ref 6–20)
CO2: 26 mmol/L (ref 22–32)
Calcium: 8.4 mg/dL — ABNORMAL LOW (ref 8.9–10.3)
Chloride: 100 mmol/L (ref 98–111)
Creatinine, Ser: 0.48 mg/dL (ref 0.44–1.00)
GFR calc Af Amer: >60 mL/min (ref >60)
GFR calc non Af Amer: >60 mL/min (ref >60)
Glucose, Bld: 96 mg/dL (ref 70–99)
Potassium: 3 mmol/L — ABNORMAL LOW (ref 3.5–5.1)
Sodium: 136 mmol/L (ref 135–145)

## 2018-05-15 MED ORDER — POTASSIUM CHLORIDE 10 MEQ/100ML IV SOLN
10.0000 meq | INTRAVENOUS | Status: AC
  Administered 2018-05-15 (×5): 10 meq via INTRAVENOUS
  Filled 2018-05-15 (×4): qty 100

## 2018-05-15 MED ORDER — SODIUM CHLORIDE 0.9 % IV SOLN
3.0000 mg/kg | Freq: Three times a day (TID) | INTRAVENOUS | Status: DC
  Administered 2018-05-15 – 2018-05-18 (×9): 175 mg via INTRAVENOUS
  Filled 2018-05-15 (×12): qty 3.5

## 2018-05-15 MED ORDER — LORAZEPAM 2 MG/ML IJ SOLN
0.2500 mg | Freq: Two times a day (BID) | INTRAMUSCULAR | Status: DC
  Administered 2018-05-15 – 2018-05-18 (×7): 0.25 mg via INTRAVENOUS
  Filled 2018-05-15 (×7): qty 1

## 2018-05-15 NOTE — Progress Notes (Signed)
    Progress Note   Subjective  Chief Complaint: Gastroparesis, upper abdominal pain and vomiting  This morning patient continues to writhe in pain in her bed, tells me she continues with nausea and vomiting and is only able to hold down a little bit of Gatorade.  Nothing is seeming to make this better.  She tells me she does not feel any better than when she came into the hospital.   Objective   Vital signs in last 24 hours: Temp:  [97.8 F (36.6 C)-98.7 F (37.1 C)] 97.8 F (36.6 C) (02/15 0617) Pulse Rate:  [99-107] 107 (02/15 0617) Resp:  [16-20] 20 (02/15 0617) BP: (160-168)/(108-114) 166/114 (02/15 0617) SpO2:  [98 %-100 %] 100 % (02/15 0617) Last BM Date: 05/13/18 General:    AA female in NAD Heart:  Regular rate and rhythm; no murmurs Lungs: Respirations even and unlabored, lungs CTA bilaterally Abdomen:  Soft, moderate epigastric ttp and nondistended. Normal bowel sounds. Extremities:  Without edema. Neurologic:  Alert and oriented,  grossly normal neurologically. Psych:  Cooperative. Normal mood and affect.  Intake/Output from previous day: 02/14 0701 - 02/15 0700 In: 800 [P.O.:800] Out: 1100 [Urine:900; Emesis/NG output:200]  Lab Results: Recent Labs    05/14/18 0337  WBC 4.7  HGB 9.5*  HCT 30.2*  PLT 199   BMET Recent Labs    05/13/18 0703 05/14/18 0337 05/15/18 0349  NA 142 140 136  K 3.1* 3.3* 3.0*  CL 109 107 100  CO2 27 25 26   GLUCOSE 144* 148* 96  BUN 26* 23* 13  CREATININE 0.52 0.44 0.48  CALCIUM 8.5* 8.4* 8.4*   LFT Recent Labs    05/14/18 0337  PROT 6.7  ALBUMIN 3.1*  AST 18  ALT 16  ALKPHOS 69  BILITOT 1.5*    Assessment / Plan:   Assessment: 1.  Gastroparesis: Secondary to diabetes type 1, with upper abdominal pain, nausea and vomiting, history of prolonged QTc, monitoring this with current erythromycin dosing, last EKG 05/14/2018 normal 2.  Hypokalemia: Secondary to above 3.  Anemia: Likely dilutional with no signs of acute  bleed, last hemoglobin 9.5 05/14/2018  Plan: 1.  Continue Erythromycin 2.  Continue Phenergan and Zofran 3.  Spoke with CNA's about getting the patient into the shower, I feel like the hot water would make her feel some better.   4.  Please await further recommendations from Dr. Adela Lank  Thank you for your kind consultation, we will continue to follow.   LOS: 2 days   Unk Lightning  05/15/2018, 8:57 AM

## 2018-05-15 NOTE — Progress Notes (Signed)
PROGRESS NOTE    [name]  GGY:[id number] DOB: May 26, 1990 DOA: 05/11/2018 PCP: Patient, No Pcp Per   28 y.o.femalewith medical history significant fortype 1 diabetes mellitus, chronic abdominal pain, and frequent admissions related to uncontrolled glucose and gastroparesis, now presenting to the emergency department for evaluation of increase in her chronic abdominal pain along with nausea and recurrent vomiting.Patient reports insidious worsening in her chronic upper abdominal pain over the past 2 days, becoming severe, and associated with nausea and nonbloody vomiting. She has been unable to keep any food, liquids, or medications down for the past 2 days. She describes the symptoms as very similar to her prior episodes for which she is seen frequently in the ED. She denies any dysuria, hematemesis, melena, hematochezia, chest pain, shortness of breath, boils, wounds, or new rashes. She follows with GI for gastroparesis and chronic diarrhea, symptoms have been difficult to control, and she has been referred to tertiary center for consideration of gastric pacemaker.  ED Course:Upon arrival to the ED, patient is found to be afebrile, saturating well on room air, tachycardic in the 110s, and with stable blood pressure. Chemistry panel is notable for a glucose of 255 with normal bicarbonate and anion gap, and a slight hypokalemia. CBC features a stable normocytic anemia. Urinalysis not suggestive of infection. KUB with no acute abnormalities. Patient was given IV fluids, fentanyl, Dilaudid, Haldol, Zofran, Reglan, and Phenergan in the ED. She continues to complain of severe nausea, is unable to tolerate her home medications due to this, and will be observed for further evaluation and management. Brief Narrative:    Assessment & Plan:   Principal Problem:   Abdominal pain with vomiting Active Problems:   Diabetes mellitus type 1 ([place])   #1 diabetic gastroparesis patient reports  she had multiple episodes of vomiting but had no diarrhea.  She was not able to tolerate Jell-O.  She was started with erythromycin last night along with Phenergan and Zofran Reglan and Dilaudid.  All medications were given IV.  She really has not made any improvement since admission.I WONDER IF SHE NEEDS TO BE TRANSFERRED TO [facility] for gastric pacemaker.  Will defer to GI.  #2 type 1 diabetes continue Lantus and SSI  3 hypokalemia replace and recheck.  DVT prophylaxis:Lovenox Code Statusfull code Family Communication:Discussed with patient Disposition Plan:. pending any clinical improvement  Consultants:GI  Procedures:None Antimicrobials:None   Nutrition Problem: Moderate Malnutrition Etiology: chronic illness(type 1 diabetes, gastroparesis)     Signs/Symptoms: percent weight loss, energy intake < or equal to 75% for > or equal to 1 month, moderate muscle depletion Percent weight loss: 14 %(x 8 months)    Interventions: Refer to RD note for recommendations  Estimated body mass index is 19.97 kg/m as calculated from the following:   Height as of this encounter: 5' 7"  (1.702 m).   Weight as of this encounter: 57.8 kg.  Subjective:  Had no diarrhea overnight but vomited many times that she could not count complains of abdominal pain not able to tolerate clear liquids or Jell-O. Objective: Vitals:   05/14/18 1641 05/14/18 2124 05/14/18 2334 05/15/18 0617  BP: (!) 168/109 (!) 160/108 (!) 168/110 (!) 166/114  Pulse: (!) 105 99 100 (!) 107  Resp:  16  20  Temp: 98.7 F (37.1 C) 98.5 F (36.9 C)  97.8 F (36.6 C)  TempSrc: Oral Oral  Oral  SpO2: 98% 100% 99% 100%  Weight:      Height:  Intake/Output Summary (Last 24 hours) at 05/15/2018 0850 Last data filed at 05/15/2018 0305 Gross per 24 hour  Intake 450 ml  Output 1100 ml  Net -650 ml   Filed Weights   05/12/18 0404 05/13/18 0450 05/14/18 0500  Weight: 54.5 kg 54.9 kg 57.8 kg     Examination:  General exam: Appears calm and comfortable  Respiratory system: Clear to auscultation. Respiratory effort normal. Cardiovascular system: S1 & S2 heard, RRR. No JVD, murmurs, rubs, gallops or clicks. No pedal edema. Gastrointestinal system: Abdomen is nondistended, soft and nontender. No organomegaly or masses felt. Normal bowel sounds heard. Central nervous system: Alert and oriented. No focal neurological deficits. Extremities: Symmetric 5 x 5 power. Skin: No rashes, lesions or ulcers Psychiatry: Judgement and insight appear normal. Mood & affect appropriate.     Data Reviewed: I have personally reviewed following labs and imaging studies  CBC: Recent Labs  Lab 05/11/18 1553 05/12/18 0455 05/14/18 0337  WBC 4.6 6.1 4.7  NEUTROABS  --  5.0  --   HGB 11.8* 11.6* 9.5*  HCT 35.7* 35.6* 30.2*  MCV 83.2 86.8 87.5  PLT 228 224 789   Basic Metabolic Panel: Recent Labs  Lab 05/11/18 1553 05/12/18 0455 05/13/18 0703 05/14/18 0337 05/15/18 0349  NA 139 136 142 140 136  K 3.4* 3.5 3.1* 3.3* 3.0*  CL 97* 98 109 107 100  CO2 30 18* 27 25 26   GLUCOSE 255* 290* 144* 148* 96  BUN 13 14 26* 23* 13  CREATININE 0.50 0.52 0.52 0.44 0.48  CALCIUM 9.8 8.7* 8.5* 8.4* 8.4*  MG  --  1.6* 2.3  --   --    GFR: Estimated Creatinine Clearance: 95.5 mL/min (by C-G formula based on SCr of 0.48 mg/dL). Liver Function Tests: Recent Labs  Lab 05/11/18 1553 05/14/18 0337  AST 25 18  ALT 21 16  ALKPHOS 99 69  BILITOT 0.7 1.5*  PROT 8.8* 6.7  ALBUMIN 4.1 3.1*   Recent Labs  Lab 05/11/18 1553 05/14/18 0337  LIPASE 25 24   No results for input(s): AMMONIA in the last 168 hours. Coagulation Profile: No results for input(s): INR, PROTIME in the last 168 hours. Cardiac Enzymes: No results for input(s): CKTOTAL, CKMB, CKMBINDEX, TROPONINI in the last 168 hours. BNP (last 3 results) No results for input(s): PROBNP in the last 8760 hours. HbA1C: No results for  input(s): HGBA1C in the last 72 hours. CBG: Recent Labs  Lab 05/14/18 1640 05/14/18 2046 05/15/18 0046 05/15/18 0439 05/15/18 0750  GLUCAP 95 89 73 99 93   Lipid Profile: No results for input(s): CHOL, HDL, LDLCALC, TRIG, CHOLHDL, LDLDIRECT in the last 72 hours. Thyroid Function Tests: No results for input(s): TSH, T4TOTAL, FREET4, T3FREE, THYROIDAB in the last 72 hours. Anemia Panel: No results for input(s): VITAMINB12, FOLATE, FERRITIN, TIBC, IRON, RETICCTPCT in the last 72 hours. Sepsis Labs: No results for input(s): PROCALCITON, LATICACIDVEN in the last 168 hours.  No results found for this or any previous visit (from the past 240 hour(s)).       Radiology Studies: No results found.      Scheduled Meds: . enoxaparin (LOVENOX) injection  40 mg Subcutaneous Q24H  . insulin aspart  0-9 Units Subcutaneous Q4H  . insulin glargine  9 Units Subcutaneous Daily  . ketorolac  30 mg Intravenous Q6H  . mirtazapine  15 mg Oral QHS  . ondansetron (ZOFRAN) IV  4 mg Intravenous Q6H   Or  . ondansetron  4  mg Oral Q6H  . pantoprazole (PROTONIX) IV  40 mg Intravenous Q12H   Continuous Infusions: . sodium chloride 100 mL/hr at 05/14/18 2319  . erythromycin 125 mg (05/15/18 0813)     LOS: 2 days     Georgette Shell, MD [facility]  If 7PM-7AM, please contact night-coverage [email or web address] Password [facility] 05/15/2018, 8:50 AM

## 2018-05-16 LAB — GLUCOSE, CAPILLARY
Glucose-Capillary: 115 mg/dL — ABNORMAL HIGH (ref 70–99)
Glucose-Capillary: 126 mg/dL — ABNORMAL HIGH (ref 70–99)
Glucose-Capillary: 131 mg/dL — ABNORMAL HIGH (ref 70–99)
Glucose-Capillary: 49 mg/dL — ABNORMAL LOW (ref 70–99)
Glucose-Capillary: 71 mg/dL (ref 70–99)

## 2018-05-16 MED ORDER — PANTOPRAZOLE SODIUM 40 MG PO TBEC
40.0000 mg | DELAYED_RELEASE_TABLET | Freq: Two times a day (BID) | ORAL | Status: DC
  Administered 2018-05-16 – 2018-05-18 (×4): 40 mg via ORAL
  Filled 2018-05-16 (×4): qty 1

## 2018-05-16 MED ORDER — MAGNESIUM SULFATE 2 GM/50ML IV SOLN
2.0000 g | Freq: Once | INTRAVENOUS | Status: AC
  Administered 2018-05-16: 2 g via INTRAVENOUS
  Filled 2018-05-16: qty 50

## 2018-05-16 MED ORDER — POTASSIUM CHLORIDE 10 MEQ/100ML IV SOLN
10.0000 meq | INTRAVENOUS | Status: AC
  Administered 2018-05-16 (×5): 10 meq via INTRAVENOUS
  Filled 2018-05-16 (×5): qty 100

## 2018-05-16 NOTE — Progress Notes (Signed)
Pt's blood sugar was 49. Doctor was notified. Hypoglycemia protocol was initiated and pt had 8 oz of juice. Will recheck CBG in 15.

## 2018-05-16 NOTE — Progress Notes (Signed)
    Progress Note   Subjective  Chief Complaint: Gastroparesis, upper abdominal pain and vomiting  Today, the patient does tell me that she feels a little bit better.  Tells me that she had some Ativan added yesterday and feels like that is helping.  She tolerated some crackers yesterday and would like to try some soft diet including eggs and maybe some grits.  She is ready to take a shower this morning.   Objective   Vital signs in last 24 hours: Temp:  [98.2 F (36.8 C)-98.6 F (37 C)] 98.2 F (36.8 C) (02/16 0637) Pulse Rate:  [95-104] 104 (02/16 0637) Resp:  [12-20] 16 (02/16 0637) BP: (107-184)/(63-123) 107/63 (02/16 0600) SpO2:  [98 %-100 %] 100 % (02/16 0637) Last BM Date: 05/14/18 General:    AAfemale in NAD Heart:  Regular rate and rhythm; no murmurs Lungs: Respirations even and unlabored, lungs CTA bilaterally Abdomen:  Soft, mild epigastric ttp and nondistended. Normal bowel sounds. Extremities:  Without edema. Neurologic:  Alert and oriented,  grossly normal neurologically. Psych:  Cooperative. Normal mood and affect.  Intake/Output from previous day: 02/15 0701 - 02/16 0700 In: 940 [P.O.:940] Out: 800 [Urine:800]  Lab Results: Recent Labs    05/14/18 0337  WBC 4.7  HGB 9.5*  HCT 30.2*  PLT 199   BMET Recent Labs    05/14/18 0337 05/15/18 0349  NA 140 136  K 3.3* 3.0*  CL 107 100  CO2 25 26  GLUCOSE 148* 96  BUN 23* 13  CREATININE 0.44 0.48  CALCIUM 8.4* 8.4*   LFT Recent Labs    05/14/18 0337  PROT 6.7  ALBUMIN 3.1*  AST 18  ALT 16  ALKPHOS 69  BILITOT 1.5*    Assessment / Plan:   Assessment: 1.  Gastroparesis: Secondary to diabetes type 1, with upper abdominal pain nausea and vomiting, history of prolonged QTC, monitoring this with current Erythromycin dosing, today is actually feeling better after addition of Ativan yesterday 2.  Hypokalemia: We will need further replacement as this has dropped to 3 overnight 3.   Anemia:  Plan: 1.  Continue current recommendations 2.  Ordered EKG to be done this morning to check on QTC 3.  Ordered a soft diet for the patient this morning, discussed that if she has any additional discomfort nausea or vomiting she should stop eating.  We also discussed that she should eat very small amounts.  She agreed. 4.  Patient will need further replacement of potassium, will leave this to the hospitalist  5.  Please await any further recommendations from Dr. Havery Moros later today  Thank you for kind consultation, we will continue to follow.   LOS: 3 days   Levin Erp  05/16/2018, 8:36 AM

## 2018-05-16 NOTE — Progress Notes (Signed)
PROGRESS NOTE    [name]  JAS:[id number] DOB: Aug 08, 1990 DOA: 05/11/2018 PCP: Patient, No Pcp Per   Brief Narrative:27 y.o.femalewith medical history significant fortype 1 diabetes mellitus, chronic abdominal pain, and frequent admissions related to uncontrolled glucose and gastroparesis, now presenting to the emergency department for evaluation of increase in her chronic abdominal pain along with nausea and recurrent vomiting.Patient reports insidious worsening in her chronic upper abdominal pain over the past 2 days, becoming severe, and associated with nausea and nonbloody vomiting. She has been unable to keep any food, liquids, or medications down for the past 2 days. She describes the symptoms as very similar to her prior episodes for which she is seen frequently in the ED. She denies any dysuria, hematemesis, melena, hematochezia, chest pain, shortness of breath, boils, wounds, or new rashes. She follows with GI for gastroparesis and chronic diarrhea, symptoms have been difficult to control, and she has been referred to tertiary center for consideration of gastric pacemaker.  ED Course:Upon arrival to the ED, patient is found to be afebrile, saturating well on room air, tachycardic in the 110s, and with stable blood pressure. Chemistry panel is notable for a glucose of 255 with normal bicarbonate and anion gap, and a slight hypokalemia. CBC features a stable normocytic anemia. Urinalysis not suggestive of infection. KUB with no acute abnormalities. Patient was given IV fluids, fentanyl, Dilaudid, Haldol, Zofran, Reglan, and Phenergan in the ED. She continues to complain of severe nausea, is unable to tolerate her home medications due to this, and will be observed for further evaluation and management.  Assessment & Plan:   Principal Problem:   Abdominal pain with vomiting Active Problems:   Diabetes mellitus type 1 ([place])   #1 diabetic gastroparesis patient reports she  is feeling better did not vomit overnight but continues to have diarrhea which is chronic.  She is willing to try advancing the diet.  She agrees she will only eat  small portions.  She thinks erythromycin and Ativan helped.qtc 464.  Monitor closely.  #2 type 1 diabetes continue Lantus and SSI  3 hypokalemia replace and recheck.  DVT prophylaxis:Lovenox Code Statusfull code Family Communication:Discussed with patient Disposition Plan:. pending any clinical improvement  Consultants:GI  Procedures:None Antimicrobials:None      Nutrition Problem: Moderate Malnutrition Etiology: chronic illness(type 1 diabetes, gastroparesis)     Signs/Symptoms: percent weight loss, energy intake < or equal to 75% for > or equal to 1 month, moderate muscle depletion Percent weight loss: 14 %(x 8 months)    Interventions: Refer to RD note for recommendations  Estimated body mass index is 19.97 kg/m as calculated from the following:   Height as of this encounter: 5' 7"  (1.702 m).   Weight as of this encounter: 57.8 kg.  Subjective:  Patient sitting up in bed by the side of the bed watching iPad or reading a pad appeared comfortable than ever of seen her Objective: Vitals:   05/15/18 1700 05/15/18 1821 05/15/18 2037 05/16/18 0637  BP: (!) 142/106 (!) 134/102 (!) 135/98 107/63  Pulse: (!) 102 97 95 (!) 104  Resp:  18 12 16   Temp:   98.6 F (37 C) 98.2 F (36.8 C)  TempSrc:   Oral Oral  SpO2:  100% 100% 100%  Weight:      Height:        Intake/Output Summary (Last 24 hours) at 05/16/2018 1104 Last data filed at 05/16/2018 0431 Gross per 24 hour  Intake 940 ml  Output  800 ml  Net 140 ml   Filed Weights   05/12/18 0404 05/13/18 0450 05/14/18 0500  Weight: 54.5 kg 54.9 kg 57.8 kg    Examination:  General exam: Appears calm and comfortable  Respiratory system: Clear to auscultation. Respiratory effort normal. Cardiovascular system: S1 & S2 heard, RRR. No JVD,  murmurs, rubs, gallops or clicks. No pedal edema. Gastrointestinal system: Abdomen is nondistended, soft and nontender. No organomegaly or masses felt. Normal bowel sounds heard. Central nervous system: Alert and oriented. No focal neurological deficits. Extremities: Symmetric 5 x 5 power. Skin: No rashes, lesions or ulcers Psychiatry: Judgement and insight appear normal. Mood & affect appropriate.     Data Reviewed: I have personally reviewed following labs and imaging studies  CBC: Recent Labs  Lab 05/11/18 1553 05/12/18 0455 05/14/18 0337  WBC 4.6 6.1 4.7  NEUTROABS  --  5.0  --   HGB 11.8* 11.6* 9.5*  HCT 35.7* 35.6* 30.2*  MCV 83.2 86.8 87.5  PLT 228 224 937   Basic Metabolic Panel: Recent Labs  Lab 05/11/18 1553 05/12/18 0455 05/13/18 0703 05/14/18 0337 05/15/18 0349  NA 139 136 142 140 136  K 3.4* 3.5 3.1* 3.3* 3.0*  CL 97* 98 109 107 100  CO2 30 18* 27 25 26   GLUCOSE 255* 290* 144* 148* 96  BUN 13 14 26* 23* 13  CREATININE 0.50 0.52 0.52 0.44 0.48  CALCIUM 9.8 8.7* 8.5* 8.4* 8.4*  MG  --  1.6* 2.3  --   --    GFR: Estimated Creatinine Clearance: 95.5 mL/min (by C-G formula based on SCr of 0.48 mg/dL). Liver Function Tests: Recent Labs  Lab 05/11/18 1553 05/14/18 0337  AST 25 18  ALT 21 16  ALKPHOS 99 69  BILITOT 0.7 1.5*  PROT 8.8* 6.7  ALBUMIN 4.1 3.1*   Recent Labs  Lab 05/11/18 1553 05/14/18 0337  LIPASE 25 24   No results for input(s): AMMONIA in the last 168 hours. Coagulation Profile: No results for input(s): INR, PROTIME in the last 168 hours. Cardiac Enzymes: No results for input(s): CKTOTAL, CKMB, CKMBINDEX, TROPONINI in the last 168 hours. BNP (last 3 results) No results for input(s): PROBNP in the last 8760 hours. HbA1C: No results for input(s): HGBA1C in the last 72 hours. CBG: Recent Labs  Lab 05/15/18 1134 05/15/18 1655 05/15/18 2032 05/16/18 0010 05/16/18 0744  GLUCAP 107* 89 89 115* 126*   Lipid Profile: No  results for input(s): CHOL, HDL, LDLCALC, TRIG, CHOLHDL, LDLDIRECT in the last 72 hours. Thyroid Function Tests: No results for input(s): TSH, T4TOTAL, FREET4, T3FREE, THYROIDAB in the last 72 hours. Anemia Panel: No results for input(s): VITAMINB12, FOLATE, FERRITIN, TIBC, IRON, RETICCTPCT in the last 72 hours. Sepsis Labs: No results for input(s): PROCALCITON, LATICACIDVEN in the last 168 hours.  No results found for this or any previous visit (from the past 240 hour(s)).       Radiology Studies: No results found.      Scheduled Meds: . enoxaparin (LOVENOX) injection  40 mg Subcutaneous Q24H  . insulin aspart  0-9 Units Subcutaneous Q4H  . insulin glargine  9 Units Subcutaneous Daily  . LORazepam  0.25 mg Intravenous Q12H  . mirtazapine  15 mg Oral QHS  . ondansetron (ZOFRAN) IV  4 mg Intravenous Q6H   Or  . ondansetron  4 mg Oral Q6H  . pantoprazole (PROTONIX) IV  40 mg Intravenous Q12H   Continuous Infusions: . sodium chloride 100 mL/hr at 05/16/18  8377  . erythromycin 175 mg (05/16/18 0542)     LOS: 3 days     Georgette Shell, MD [facility]  If 7PM-7AM, please contact night-coverage [email or web address] Password [facility] 05/16/2018, 11:04 AM

## 2018-05-16 NOTE — Progress Notes (Signed)
Pt's blood sugar was rechecked and is now reading at a 71. Will continue to monitor.

## 2018-05-16 NOTE — Progress Notes (Signed)

## 2018-05-17 LAB — BASIC METABOLIC PANEL
Anion gap: 6 (ref 5–15)
BUN: 13 mg/dL (ref 6–20)
CO2: 28 mmol/L (ref 22–32)
Calcium: 8.4 mg/dL — ABNORMAL LOW (ref 8.9–10.3)
Chloride: 102 mmol/L (ref 98–111)
Creatinine, Ser: 0.5 mg/dL (ref 0.44–1.00)
GFR calc Af Amer: >60 mL/min (ref >60)
GFR calc non Af Amer: >60 mL/min (ref >60)
Glucose, Bld: 152 mg/dL — ABNORMAL HIGH (ref 70–99)
Potassium: 3.6 mmol/L (ref 3.5–5.1)
Sodium: 136 mmol/L (ref 135–145)

## 2018-05-17 LAB — GLUCOSE, CAPILLARY
Glucose-Capillary: 150 mg/dL — ABNORMAL HIGH (ref 70–99)
Glucose-Capillary: 128 mg/dL — ABNORMAL HIGH (ref 70–99)
Glucose-Capillary: 72 mg/dL (ref 70–99)
Glucose-Capillary: 141 mg/dL — ABNORMAL HIGH (ref 70–99)
Glucose-Capillary: 222 mg/dL — ABNORMAL HIGH (ref 70–99)
Glucose-Capillary: 115 mg/dL — ABNORMAL HIGH (ref 70–99)
Glucose-Capillary: 149 mg/dL — ABNORMAL HIGH (ref 70–99)
Glucose-Capillary: 139 mg/dL — ABNORMAL HIGH (ref 70–99)

## 2018-05-17 MED ORDER — ENSURE ENLIVE PO LIQD
237.0000 mL | Freq: Two times a day (BID) | ORAL | Status: DC
  Administered 2018-05-17 – 2018-05-18 (×2): 237 mL via ORAL

## 2018-05-17 MED ORDER — POTASSIUM CHLORIDE 10 MEQ/100ML IV SOLN
10.0000 meq | INTRAVENOUS | Status: AC
  Administered 2018-05-17 (×2): 10 meq via INTRAVENOUS
  Filled 2018-05-17 (×2): qty 100

## 2018-05-17 NOTE — Progress Notes (Signed)
PROGRESS NOTE    [name]  WJX:[id number] DOB: July 21, 1990 DOA: 05/11/2018 PCP: Patient, No Pcp Per   Brief Narrative:28 y.o.femalewith medical history significant fortype 1 diabetes mellitus, chronic abdominal pain, and frequent admissions related to uncontrolled glucose and gastroparesis, now presenting to the emergency department for evaluation of increase in her chronic abdominal pain along with nausea and recurrent vomiting.Patient reports insidious worsening in her chronic upper abdominal pain over the past 2 days, becoming severe, and associated with nausea and nonbloody vomiting. She has been unable to keep any food, liquids, or medications down for the past 2 days. She describes the symptoms as very similar to her prior episodes for which she is seen frequently in the ED. She denies any dysuria, hematemesis, melena, hematochezia, chest pain, shortness of breath, boils, wounds, or new rashes. She follows with GI for gastroparesis and chronic diarrhea, symptoms have been difficult to control, and she has been referred to tertiary center for consideration of gastric pacemaker.  ED Course:Upon arrival to the ED, patient is found to be afebrile, saturating well on room air, tachycardic in the 110s, and with stable blood pressure. Chemistry panel is notable for a glucose of 255 with normal bicarbonate and anion gap, and a slight hypokalemia. CBC features a stable normocytic anemia. Urinalysis not suggestive of infection. KUB with no acute abnormalities. Patient was given IV fluids, fentanyl, Dilaudid, Haldol, Zofran, Reglan, and Phenergan in the ED. She continues to complain of severe nausea, is unable to tolerate her home medications due to this, and will be observed for further evaluation and management.  Assessment & Plan:   Principal Problem:   Abdominal pain with vomiting Active Problems:   Diabetes mellitus type 1 ([place])  #1 diabetic gastroparesis patient reportsshe  is feeling better did not vomit overnight but continues to have diarrhea which is chronic.  Continue liquids and soft diet slowly.  Advance diet very slowly.  Continue Phenergan and Zofran Ativan.  Continue erythromycin IV.  Await GI input.  Will watch her overnight if she improves continues to be stable will plan discharge maybe tomorrow.  Monitor QT.  #2 type 1 diabetes continue Lantus and SSI  3 hypokalemia replace and recheck.   DVT prophylaxis:Lovenox Code Statusfull code Family Communication:Discussed with patient Disposition Plan:. pending any clinical improvement  Consultants:GI  Procedures:None Antimicrobials:None    Nutrition Problem: Moderate Malnutrition Etiology: chronic illness(type 1 diabetes, gastroparesis)     Signs/Symptoms: percent weight loss, energy intake < or equal to 75% for > or equal to 1 month, moderate muscle depletion Percent weight loss: 14 %(x 8 months)    Interventions: Refer to RD note for recommendations  Estimated body mass index is 21.6 kg/m as calculated from the following:   Height as of this encounter: 5' 7"  (1.702 m).   Weight as of this encounter: 62.6 kg.   Subjective: Patient sitting up by the side of the bed denies any nausea overnight denies any vomiting overnight has had diarrhea which is chronic abdominal pain is better able to tolerate some liquid and some soft diet.  Objective: Vitals:   05/16/18 0637 05/16/18 1347 05/16/18 2022 05/17/18 0442  BP: 107/63 93/73 120/81 128/89  Pulse: (!) 104 86 100 (!) 102  Resp: 16 16 16 16   Temp: 98.2 F (36.8 C) (!) 97.5 F (36.4 C) 99.5 F (37.5 C) 98.8 F (37.1 C)  TempSrc: Oral Oral Oral Oral  SpO2: 100% 99% 100% 100%  Weight:    62.6 kg  Height:  Intake/Output Summary (Last 24 hours) at 05/17/2018 0951 Last data filed at 05/16/2018 1923 Gross per 24 hour  Intake 480 ml  Output -  Net 480 ml   Filed Weights   05/13/18 0450 05/14/18 0500 05/17/18  0442  Weight: 54.9 kg 57.8 kg 62.6 kg    Examination:  General exam: Appears calm and comfortable  Respiratory system: Clear to auscultation. Respiratory effort normal. Cardiovascular system: S1 & S2 heard, RRR. No JVD, murmurs, rubs, gallops or clicks. No pedal edema. Gastrointestinal system: Abdomen is nondistended, soft and tender. No organomegaly or masses felt. Normal bowel sounds heard. Central nervous system: Alert and oriented. No focal neurological deficits. Extremities: Symmetric 5 x 5 power. Skin: No rashes, lesions or ulcers Psychiatry: Judgement and insight appear normal. Mood & affect appropriate.     Data Reviewed: I have personally reviewed following labs and imaging studies  CBC: Recent Labs  Lab 05/11/18 1553 05/12/18 0455 05/14/18 0337  WBC 4.6 6.1 4.7  NEUTROABS  --  5.0  --   HGB 11.8* 11.6* 9.5*  HCT 35.7* 35.6* 30.2*  MCV 83.2 86.8 87.5  PLT 228 224 712   Basic Metabolic Panel: Recent Labs  Lab 05/12/18 0455 05/13/18 0703 05/14/18 0337 05/15/18 0349 05/17/18 0611  NA 136 142 140 136 136  K 3.5 3.1* 3.3* 3.0* 3.6  CL 98 109 107 100 102  CO2 18* 27 25 26 28   GLUCOSE 290* 144* 148* 96 152*  BUN 14 26* 23* 13 13  CREATININE 0.52 0.52 0.44 0.48 0.50  CALCIUM 8.7* 8.5* 8.4* 8.4* 8.4*  MG 1.6* 2.3  --   --   --    GFR: Estimated Creatinine Clearance: 101.8 mL/min (by C-G formula based on SCr of 0.5 mg/dL). Liver Function Tests: Recent Labs  Lab 05/11/18 1553 05/14/18 0337  AST 25 18  ALT 21 16  ALKPHOS 99 69  BILITOT 0.7 1.5*  PROT 8.8* 6.7  ALBUMIN 4.1 3.1*   Recent Labs  Lab 05/11/18 1553 05/14/18 0337  LIPASE 25 24   No results for input(s): AMMONIA in the last 168 hours. Coagulation Profile: No results for input(s): INR, PROTIME in the last 168 hours. Cardiac Enzymes: No results for input(s): CKTOTAL, CKMB, CKMBINDEX, TROPONINI in the last 168 hours. BNP (last 3 results) No results for input(s): PROBNP in the last 8760  hours. HbA1C: No results for input(s): HGBA1C in the last 72 hours. CBG: Recent Labs  Lab 05/16/18 1145 05/16/18 1223 05/16/18 1710 05/17/18 0439 05/17/18 0732  GLUCAP 49* 71 131* 150* 128*   Lipid Profile: No results for input(s): CHOL, HDL, LDLCALC, TRIG, CHOLHDL, LDLDIRECT in the last 72 hours. Thyroid Function Tests: No results for input(s): TSH, T4TOTAL, FREET4, T3FREE, THYROIDAB in the last 72 hours. Anemia Panel: No results for input(s): VITAMINB12, FOLATE, FERRITIN, TIBC, IRON, RETICCTPCT in the last 72 hours. Sepsis Labs: No results for input(s): PROCALCITON, LATICACIDVEN in the last 168 hours.  No results found for this or any previous visit (from the past 240 hour(s)).       Radiology Studies: No results found.      Scheduled Meds: . enoxaparin (LOVENOX) injection  40 mg Subcutaneous Q24H  . insulin aspart  0-9 Units Subcutaneous Q4H  . insulin glargine  9 Units Subcutaneous Daily  . LORazepam  0.25 mg Intravenous Q12H  . mirtazapine  15 mg Oral QHS  . ondansetron (ZOFRAN) IV  4 mg Intravenous Q6H   Or  . ondansetron  4 mg  Oral Q6H  . pantoprazole  40 mg Oral BID   Continuous Infusions: . sodium chloride 100 mL/hr at 05/16/18 0545  . erythromycin 175 mg (05/17/18 0626)     LOS: 4 days     Georgette Shell, MD [facility]  If 7PM-7AM, please contact night-coverage [email or web address] Password [facility] 05/17/2018, 9:51 AM

## 2018-05-17 NOTE — Progress Notes (Addendum)
     [facility] Progress Note  CC:  Nausea, vomiting, upper abdominal pan, gastroparesis  Subjective: Nausea is less. Last vomited x 1 yesterday. Upper abdominal pain rated 8 our of 10, down to 7 after Dilaudid 1mg  IV given 1 hour ago. Drinking fluids, eating a soft diet this am.  Continues to have diarrhea with mucous which she states is chronic.  Objective:  Vital signs in last 24 hours: Temp:  [97.5 F (36.4 C)-99.5 F (37.5 C)] 98.8 F (37.1 C) (02/17 0442) Pulse Rate:  [86-102] 102 (02/17 0442) Resp:  [16] 16 (02/17 0442) BP: (93-128)/(73-89) 128/89 (02/17 0442) SpO2:  [99 %-100 %] 100 % (02/17 0442) Weight:  [62.6 kg] 62.6 kg (02/17 0442) Last BM Date: 05/14/18 General:   Alert,  Well-developed,    in NAD Heart:  Tachycardic, no murmurs Pulm: clear throughout. Abdomen:  Soft, nondistended, moderate tenderness LUQ, epigastric and RUQ without rebound or guarding, + BS x 4 quads.  Extremities:  Without edema. Neurologic:  Alert and  oriented x4;  grossly normal neurologically. Psych:  Alert and cooperative. Normal mood and affect.  Intake/Output from previous day: 02/16 0701 - 02/17 0700 In: 480 [P.O.:480] Out: -  Intake/Output this shift: No intake/output data recorded.  Lab Results: No results for input(s): WBC, HGB, HCT, PLT in the last 72 hours. BMET Recent Labs    05/15/18 0349 05/17/18 0611  NA 136 136  K 3.0* 3.6  CL 100 102  CO2 26 28  GLUCOSE 96 152*  BUN 13 13  CREATININE 0.48 0.50  CALCIUM 8.4* 8.4*   1) Gastroparesis. Nausea and vomiting improved with Zofran,  Erythromycin IV and Lorazepam 0.25 mg Iv Q 12 hours. History of prolonged QT interval which requires daily EKG while on Erythromycin. EKG today QTc 457, EKG 05/16/2018 QTc 465 -Dr. Russella Dar or Dr. Orvan Falconer to verify further Erythromycin IV orders -continue NS IV 100cc/hr -advance diet as tolerated -plan [facility] at [facility] -possible discharge home tomorrow if N/V and  abdominal pain stable   2) Hypokalemia improving. K 3/6 today -further management per hospitalist   3) Diarrhea, chronic -monitor diarrhea, if worsens to check C. Diff PCR and GI panel -continue to monitor electrolytes      LOS: 4 days   Arnaldo Natal  05/17/2018, 9:15 AM

## 2018-05-17 NOTE — Progress Notes (Signed)
Nutrition Follow-up  DOCUMENTATION CODES:   Non-severe (moderate) malnutrition in context of chronic illness  INTERVENTION:   -Provide Ensure Enlive po BID, each supplement provides 350 kcal and 20 grams of protein -Provided recommendations for affordable protein options  NUTRITION DIAGNOSIS:   Moderate Malnutrition related to chronic illness(type 1 diabetes, gastroparesis) as evidenced by percent weight loss, energy intake < or equal to 75% for > or equal to 1 month, moderate muscle depletion.  Ongoing.  GOAL:   Patient will meet greater than or equal to 90% of their needs  Progressing.  MONITOR:   Diet advancement, Labs, Weight trends, I & O's  REASON FOR ASSESSMENT:   Consult Assessment of nutrition requirement/status  ASSESSMENT:   28 year old female with history of type 1 diabetes frequent hospital admissions for uncontrolled diabetes and gastroparesis admitted with chronic abdominal pain with nausea and vomiting.  Patient reports feeling much better today. Denies nausea at this time. Ate 100% of her lunch today. States she would like strawberry Ensure as this is what she drinks at home. Will order. Pt would like to know more affordable options for protein supplements. RD provided some recommendations.  Weight has increased +10 lb since 2/14, most likely gains from fluid.  Medications: Zofran tablet daily, KCl infusion  Labs reviewed: CBGs: 72-128  Diet Order:   Diet Order            DIET SOFT Room service appropriate? Yes; Fluid consistency: Thin  Diet effective now              EDUCATION NEEDS:   No education needs have been identified at this time  Skin:  Skin Assessment: Reviewed RN Assessment  Last BM:  2/17  Height:   Ht Readings from Last 1 Encounters:  05/11/18 5\' 7"  (1.702 m)    Weight:   Wt Readings from Last 1 Encounters:  05/17/18 62.6 kg    Ideal Body Weight:  61.3 kg  BMI:  Body mass index is 21.6 kg/m.  Estimated  Nutritional Needs:   Kcal:  1400-1600  Protein:  65-75g  Fluid:  1.6L/day   Tilda Franco, MS, RD, LDN [facility] Inpatient Clinical Dietitian Pager: [phone] After Hours Pager: [phone]

## 2018-05-18 LAB — GLUCOSE, CAPILLARY
Glucose-Capillary: 205 mg/dL — ABNORMAL HIGH (ref 70–99)
Glucose-Capillary: 203 mg/dL — ABNORMAL HIGH (ref 70–99)
Glucose-Capillary: 135 mg/dL — ABNORMAL HIGH (ref 70–99)
Glucose-Capillary: 148 mg/dL — ABNORMAL HIGH (ref 70–99)

## 2018-05-18 LAB — COMPREHENSIVE METABOLIC PANEL
ALT: 16 U/L (ref 0–44)
AST: 24 U/L (ref 15–41)
Albumin: 3 g/dL — ABNORMAL LOW (ref 3.5–5.0)
Alkaline Phosphatase: 76 U/L (ref 38–126)
Anion gap: 5 (ref 5–15)
BUN: 15 mg/dL (ref 6–20)
CO2: 26 mmol/L (ref 22–32)
Calcium: 8.3 mg/dL — ABNORMAL LOW (ref 8.9–10.3)
Chloride: 107 mmol/L (ref 98–111)
Creatinine, Ser: 0.46 mg/dL (ref 0.44–1.00)
GFR calc Af Amer: >60 mL/min (ref >60)
GFR calc non Af Amer: >60 mL/min (ref >60)
Glucose, Bld: 147 mg/dL — ABNORMAL HIGH (ref 70–99)
Potassium: 3.4 mmol/L — ABNORMAL LOW (ref 3.5–5.1)
Sodium: 138 mmol/L (ref 135–145)
Total Bilirubin: 0.4 mg/dL (ref 0.3–1.2)
Total Protein: 5.9 g/dL — ABNORMAL LOW (ref 6.5–8.1)

## 2018-05-18 LAB — CBC
HCT: 25.6 % — ABNORMAL LOW (ref 36.0–46.0)
Hemoglobin: 8 g/dL — ABNORMAL LOW (ref 12.0–15.0)
MCH: 27.9 pg (ref 26.0–34.0)
MCHC: 31.3 g/dL (ref 30.0–36.0)
MCV: 89.2 fL (ref 80.0–100.0)
Platelets: 207 10*3/uL (ref 150–400)
RBC: 2.87 MIL/uL — ABNORMAL LOW (ref 3.87–5.11)
RDW: 12.3 % (ref 11.5–15.5)
WBC: 4.6 10*3/uL (ref 4.0–10.5)
nRBC: 0 % (ref 0.0–0.2)

## 2018-05-18 LAB — MAGNESIUM: Magnesium: 1.6 mg/dL — ABNORMAL LOW (ref 1.7–2.4)

## 2018-05-18 MED ORDER — MAGNESIUM SULFATE 2 GM/50ML IV SOLN
2.0000 g | Freq: Once | INTRAVENOUS | Status: AC
  Administered 2018-05-18: 2 g via INTRAVENOUS
  Filled 2018-05-18: qty 50

## 2018-05-18 MED ORDER — POTASSIUM CHLORIDE 10 MEQ/100ML IV SOLN
10.0000 meq | INTRAVENOUS | Status: AC
  Administered 2018-05-18 (×3): 10 meq via INTRAVENOUS
  Filled 2018-05-18 (×2): qty 100

## 2018-05-18 MED ORDER — ENSURE ENLIVE PO LIQD: 237.0000 mL | Freq: Two times a day (BID) | ORAL | 12 refills | Status: DC

## 2018-05-18 NOTE — Progress Notes (Signed)
Patient ID: [name], female   DOB: July 20, 1990, 28 y.o.   MRN: [id number]  Brief GI note: Discharge home ok per Dr. Russella Dar.

## 2018-05-18 NOTE — Progress Notes (Signed)
     [facility] Progress Note  CC:  Gastroparesis, upper abdominal pain, nausea and vomiting   Subjective: Nausea is less, no vomiting, upper abdominal pain is constant, has chronic diarrhea but reports increased volume and diarrhea was more watery during the night.   Objective:  Vital signs in last 24 hours: Temp:  [98.1 F (36.7 C)-98.7 F (37.1 C)] 98.1 F (36.7 C) (02/18 0534) Pulse Rate:  [103-106] 106 (02/18 0534) Resp:  [17-20] 17 (02/18 0534) BP: (102-151)/(66-99) 151/99 (02/18 0534) SpO2:  [99 %-100 %] 99 % (02/18 0534) Weight:  [65.7 kg] 65.7 kg (02/18 0534) Last BM Date: 05/17/18 General:   Alert,  Well-developed,    in NAD Heart: tachycardic, no murmurs. Pulm: lungs clear throughout. Abdomen: soft, nondistended, moderate epigastric tenderness without rebound or guarding, + BS x 4 quads.  Extremities:  Without edema. Neurologic:  Alert and  oriented x4;  grossly normal neurologically. Psych:  Alert and cooperative. Normal mood and affect.  Intake/Output from previous day: 02/17 0701 - 02/18 0700 In: 1120 [P.O.:720; IV Piggyback:400] Out: -  Intake/Output this shift: No intake/output data recorded.  Lab Results: No results for input(s): WBC, HGB, HCT, PLT in the last 72 hours. BMET Recent Labs    05/17/18 0611  NA 136  K 3.6  CL 102  CO2 28  GLUCOSE 152*  BUN 13  CREATININE 0.50  CALCIUM 8.4*   Assessment / Plan:  1) Gastroparesis. Nausea and vomiting improved with Zofran,  Erythromycin IV and Lorazepam 0.25 mg IV Q 12 hours. History of prolonged QT interval which requires daily EKG while on Erythromycin. EKG 2/17 QTc 457. Today's EKG pending.  -EKG this am -Further Erythromycin IV orders per Dr. Russella Dar -Continue NS IV @ 100cc/hr -plan for gastric pacemaker at [facility] -Advance diet as tolerated, appreciate recommendations from RD  2) Hypokalemia improving, however, patient is having increased diarrhea today which may reduce K+  level -labs per hospitalist    3) Diarrhea, chronic, patient reports increased watery  brown diarrhea over night which is a change from her chronic diarrhea. At risk for dehydration with tachycardia in setting of increased diarrhea.  -continue NS IV @ 100cc/hr, may need NS IV bolus -monitor diarrhea this am, consider GI panel and c. Diff PCR if diarrhea persist or worsens, recommend rechecking patient early afternoon before making decision regarding discharge home     LOS: 5 days   Arnaldo Natal  05/18/2018, 8:14 AM

## 2018-05-18 NOTE — Discharge Summary (Signed)
Physician Discharge Summary  [name] GTX:[id number] DOB: 1990-05-23 DOA: 05/11/2018  PCP: Patient, No Pcp Per  Admit date: 05/11/2018 Discharge date: 05/18/2018  Admitted From: Home Disposition: Home  recommendations for Outpatient Follow-up:  1. Follow up with PCP in 1-2 weeks 2. Please obtain BMP/CBC in one week Please follow up with GI Dr. Havery Moros and [place] for gastric pacemaker Home Health: None  equipment/Devices none  discharge Condition: Stable  CODE STATUS full code Diet recommendation: Regular soft diet frequent small meals  Brief/Interim Summary:28 y.o.femalewith medical history significant fortype 1 diabetes mellitus, chronic abdominal pain, and frequent admissions related to uncontrolled glucose and gastroparesis, now presenting to the emergency department for evaluation of increase in her chronic abdominal pain along with nausea and recurrent vomiting.Patient reports insidious worsening in her chronic upper abdominal pain over the past 2 days, becoming severe, and associated with nausea and nonbloody vomiting. She has been unable to keep any food, liquids, or medications down for the past 2 days. She describes the symptoms as very similar to her prior episodes for which she is seen frequently in the ED. She denies any dysuria, hematemesis, melena, hematochezia, chest pain, shortness of breath, boils, wounds, or new rashes. She follows with GI for gastroparesis and chronic diarrhea, symptoms have been difficult to control, and she has been referred to tertiary center for consideration of gastric pacemaker.  ED Course:Upon arrival to the ED, patient is found to be afebrile, saturating well on room air, tachycardic in the 110s, and with stable blood pressure. Chemistry panel is notable for a glucose of 255 with normal bicarbonate and anion gap, and a slight hypokalemia. CBC features a stable normocytic anemia. Urinalysis not suggestive of infection. KUB  with no acute abnormalities. Patient was given IV fluids, fentanyl, Dilaudid, Haldol, Zofran, Reglan, and Phenergan in the ED. She continues to complain of severe nausea, is unable to tolerate her home medications due to this, and will be observed for further evaluation and management.  Discharge Diagnoses:  Principal Problem:   Abdominal pain with vomiting Active Problems:   Diabetes mellitus type 1 ([place])   #1 diabetic gastroparesis patient reportssheis feeling better did not vomit overnight but continues to have diarrhea which is chronic.  Diet was advanced and patient tolerated diet.  #2 type 1 diabetes continue Lantus and SSI   #3 hypokalemia/hymagnesemia- replaced  #4 prolonged QT patient on erythromycin EKG today QT 452 better     Nutrition Problem: Moderate Malnutrition Etiology: chronic illness(type 1 diabetes, gastroparesis)    Signs/Symptoms: percent weight loss, energy intake < or equal to 75% for > or equal to 1 month, moderate muscle depletion Percent weight loss: 14 %(x 8 months)     Interventions: Refer to RD note for recommendations  Estimated body mass index is 22.69 kg/m as calculated from the following:   Height as of this encounter: 5' 7"  (1.702 m).   Weight as of this encounter: 65.7 kg.  Discharge Instructions   Allergies as of 05/18/2018      Reactions   Other Anaphylaxis   Reaction to [place] nuts   Lactose Intolerance (gi) Diarrhea      Medication List    TAKE these medications   feeding supplement (ENSURE ENLIVE) Liqd Take 237 mLs by mouth 2 (two) times daily between meals.   hyoscyamine 0.125 MG SL tablet Commonly known as:  LEVSIN SL Place 1 tablet (0.125 mg total) under the tongue every 6 (six) hours as needed for cramping (abdominal  pain).   insulin lispro 100 UNIT/ML injection Commonly known as:  HUMALOG Inject 0-0.09 mLs (0-9 Units total) into the skin 3 (three) times daily with meals.   LANTUS 100 UNIT/ML  injection Generic drug:  insulin glargine Inject 0.12 mLs (12 Units total) into the skin daily.   metoCLOPramide 10 MG tablet Commonly known as:  REGLAN Take 1 tablet (10 mg total) by mouth every 6 (six) hours as needed for nausea or vomiting.   mirtazapine 15 MG tablet Commonly known as:  REMERON TAKE 1 TABLET BY MOUTH AT  BEDTIME   ondansetron 4 MG tablet Commonly known as:  ZOFRAN Take 1 tablet (4 mg total) by mouth every 8 (eight) hours as needed for nausea or vomiting (If necessary, dont combine with Reglan.).   pantoprazole 40 MG tablet Commonly known as:  PROTONIX TAKE 1 TABLET BY MOUTH  DAILY   promethazine 12.5 MG tablet Commonly known as:  PHENERGAN Take 12.5 mg by mouth 2 (two) times daily as needed for nausea/vomiting.      Follow-up Information    Armbruster, Carlota Raspberry, MD Follow up.   Specialty:  Gastroenterology Contact information: [address] [place] [address] [phone]          Allergies  Allergen Reactions  . Other Anaphylaxis    Reaction to [place] nuts   . Lactose Intolerance (Gi) Diarrhea    Consultations:  GI   Procedures/Studies: Dg Abd Acute 2+v W 1v Chest  Result Date: 05/12/2018 CLINICAL DATA:  Abdominal pain and vomiting EXAM: DG ABDOMEN ACUTE W/ 1V CHEST COMPARISON:  03/26/2018 FINDINGS: Cardiac shadow is within normal limits. The lungs are well aerated bilaterally. No focal infiltrate or sizable effusion is seen. Scattered large and small bowel gas is noted. No free air is seen. No abnormal mass or abnormal calcifications are noted. No acute bony abnormality is seen. IMPRESSION: No acute abnormality noted. Electronically Signed   By: Inez Catalina M.D.   On: 05/12/2018 00:02   (Echo, Carotid, EGD, Colonoscopy, ERCP)    Subjective: Patient resting in bed feels like she had a good night did not have any vomiting or nausea continues to have some diarrhea   Discharge Exam: Vitals:   05/17/18 2124 05/18/18 0534   BP: (!) 129/95 (!) 151/99  Pulse: (!) 104 (!) 106  Resp: 18 17  Temp: 98.7 F (37.1 C) 98.1 F (36.7 C)  SpO2: 99% 99%   Vitals:   05/17/18 0442 05/17/18 1313 05/17/18 2124 05/18/18 0534  BP: 128/89 102/66 (!) 129/95 (!) 151/99  Pulse: (!) 102 (!) 103 (!) 104 (!) 106  Resp: 16 20 18 17   Temp: 98.8 F (37.1 C) 98.3 F (36.8 C) 98.7 F (37.1 C) 98.1 F (36.7 C)  TempSrc: Oral Oral Oral Oral  SpO2: 100% 100% 99% 99%  Weight: 62.6 kg   65.7 kg  Height:        General: Pt is alert, awake, not in acute distress Cardiovascular: RRR, S1/S2 +, no rubs, no gallops Respiratory: Decreased breath sounds at the bases bilaterally, no wheezing, no rhonchi Abdominal: Soft, NT, ND, bowel sounds + mild tender Extremities: no edema, no cyanosis    The results of significant diagnostics from this hospitalization (including imaging, microbiology, ancillary and laboratory) are listed below for reference.     Microbiology: No results found for this or any previous visit (from the past 240 hour(s)).   Labs: BNP (last 3 results) No results for input(s): BNP in  the last 8760 hours. Basic Metabolic Panel: Recent Labs  Lab 05/12/18 0455 05/13/18 0703 05/14/18 0337 05/15/18 0349 05/17/18 0611  NA 136 142 140 136 136  K 3.5 3.1* 3.3* 3.0* 3.6  CL 98 109 107 100 102  CO2 18* 27 25 26 28   GLUCOSE 290* 144* 148* 96 152*  BUN 14 26* 23* 13 13  CREATININE 0.52 0.52 0.44 0.48 0.50  CALCIUM 8.7* 8.5* 8.4* 8.4* 8.4*  MG 1.6* 2.3  --   --   --    Liver Function Tests: Recent Labs  Lab 05/11/18 1553 05/14/18 0337  AST 25 18  ALT 21 16  ALKPHOS 99 69  BILITOT 0.7 1.5*  PROT 8.8* 6.7  ALBUMIN 4.1 3.1*   Recent Labs  Lab 05/11/18 1553 05/14/18 0337  LIPASE 25 24   No results for input(s): AMMONIA in the last 168 hours. CBC: Recent Labs  Lab 05/11/18 1553 05/12/18 0455 05/14/18 0337  WBC 4.6 6.1 4.7  NEUTROABS  --  5.0  --   HGB 11.8* 11.6* 9.5*  HCT 35.7* 35.6* 30.2*   MCV 83.2 86.8 87.5  PLT 228 224 199   Cardiac Enzymes: No results for input(s): CKTOTAL, CKMB, CKMBINDEX, TROPONINI in the last 168 hours. BNP: Invalid input(s): POCBNP CBG: Recent Labs  Lab 05/17/18 1600 05/17/18 2002 05/17/18 2315 05/18/18 0409 05/18/18 0724  GLUCAP 115* 149* 139* 205* 203*   D-Dimer No results for input(s): DDIMER in the last 72 hours. Hgb A1c No results for input(s): HGBA1C in the last 72 hours. Lipid Profile No results for input(s): CHOL, HDL, LDLCALC, TRIG, CHOLHDL, LDLDIRECT in the last 72 hours. Thyroid function studies No results for input(s): TSH, T4TOTAL, T3FREE, THYROIDAB in the last 72 hours.  Invalid input(s): FREET3 Anemia work up No results for input(s): VITAMINB12, FOLATE, FERRITIN, TIBC, IRON, RETICCTPCT in the last 72 hours. Urinalysis    Component Value Date/Time   COLORURINE STRAW (A) 05/11/2018 1553   APPEARANCEUR CLEAR 05/11/2018 1553   LABSPEC 1.028 05/11/2018 1553   PHURINE 7.0 05/11/2018 1553   GLUCOSEU >=500 (A) 05/11/2018 1553   HGBUR MODERATE (A) 05/11/2018 1553   BILIRUBINUR NEGATIVE 05/11/2018 1553   BILIRUBINUR negative 01/02/2015 1717   KETONESUR 5 (A) 05/11/2018 1553   PROTEINUR 100 (A) 05/11/2018 1553   UROBILINOGEN 0.2 01/13/2015 0223   NITRITE NEGATIVE 05/11/2018 1553   LEUKOCYTESUR NEGATIVE 05/11/2018 1553   Sepsis Labs Invalid input(s): PROCALCITONIN,  WBC,  LACTICIDVEN Microbiology No results found for this or any previous visit (from the past 240 hour(s)).   Time coordinating discharge: 33 minutes  SIGNED:   Georgette Shell, MD  [facility] 05/18/2018, 8:35 AM Pager   If 7PM-7AM, please contact night-coverage [email or web address] Password [email or web address]

## 2018-05-27 ENCOUNTER — Ambulatory Visit (INDEPENDENT_AMBULATORY_CARE_PROVIDER_SITE_OTHER): Payer: 59 | Admitting: Physician Assistant

## 2018-05-27 ENCOUNTER — Telehealth: Payer: Self-pay

## 2018-05-27 ENCOUNTER — Encounter: Payer: Self-pay | Admitting: Physician Assistant

## 2018-05-27 ENCOUNTER — Other Ambulatory Visit (INDEPENDENT_AMBULATORY_CARE_PROVIDER_SITE_OTHER): Payer: 59

## 2018-05-27 VITALS — BP 136/84 | HR 98 | Ht 67.0 in | Wt 151.0 lb

## 2018-05-27 DIAGNOSIS — K219 Gastro-esophageal reflux disease without esophagitis: Secondary | ICD-10-CM

## 2018-05-27 DIAGNOSIS — K3184 Gastroparesis: Secondary | ICD-10-CM | POA: Diagnosis not present

## 2018-05-27 DIAGNOSIS — R600 Localized edema: Secondary | ICD-10-CM | POA: Diagnosis not present

## 2018-05-27 DIAGNOSIS — D649 Anemia, unspecified: Secondary | ICD-10-CM

## 2018-05-27 LAB — CBC WITH DIFFERENTIAL/PLATELET
Basophils Absolute: 0 10*3/uL (ref 0.0–0.1)
Basophils Relative: 0.6 % (ref 0.0–3.0)
Eosinophils Absolute: 0.1 10*3/uL (ref 0.0–0.7)
Eosinophils Relative: 3.3 % (ref 0.0–5.0)
HCT: 24.2 % — ABNORMAL LOW (ref 36.0–46.0)
Hemoglobin: 8.2 g/dL — ABNORMAL LOW (ref 12.0–15.0)
Lymphocytes Relative: 53.4 % — ABNORMAL HIGH (ref 12.0–46.0)
Lymphs Abs: 2.2 10*3/uL (ref 0.7–4.0)
MCHC: 33.8 g/dL (ref 30.0–36.0)
MCV: 87.1 fl (ref 78.0–100.0)
Monocytes Absolute: 0.4 10*3/uL (ref 0.1–1.0)
Monocytes Relative: 9.3 % (ref 3.0–12.0)
Neutro Abs: 1.4 10*3/uL (ref 1.4–7.7)
Neutrophils Relative %: 33.4 % — ABNORMAL LOW (ref 43.0–77.0)
Platelets: 196 10*3/uL (ref 150.0–400.0)
RBC: 2.78 Mil/uL — ABNORMAL LOW (ref 3.87–5.11)
RDW: 15.1 % (ref 11.5–15.5)
WBC: 4.1 10*3/uL (ref 4.0–10.5)

## 2018-05-27 LAB — COMPREHENSIVE METABOLIC PANEL
ALT: 15 U/L (ref 0–35)
AST: 12 U/L (ref 0–37)
Albumin: 3.2 g/dL — ABNORMAL LOW (ref 3.5–5.2)
Alkaline Phosphatase: 89 U/L (ref 39–117)
BUN: 13 mg/dL (ref 6–23)
CO2: 33 mEq/L — ABNORMAL HIGH (ref 19–32)
Calcium: 9.1 mg/dL (ref 8.4–10.5)
Chloride: 98 mEq/L (ref 96–112)
Creatinine, Ser: 0.74 mg/dL (ref 0.40–1.20)
GFR: 112.43 mL/min (ref >60.00)
Glucose, Bld: 370 mg/dL — ABNORMAL HIGH (ref 70–99)
Potassium: 4 mEq/L (ref 3.5–5.1)
Sodium: 136 mEq/L (ref 135–145)
Total Bilirubin: 0.2 mg/dL (ref 0.2–1.2)
Total Protein: 6.6 g/dL (ref 6.0–8.3)

## 2018-05-27 MED ORDER — FUROSEMIDE 40 MG PO TABS: 40.0000 mg | ORAL_TABLET | Freq: Every day | ORAL | 3 refills | Status: DC

## 2018-05-27 NOTE — Telephone Encounter (Signed)
Line busy

## 2018-05-27 NOTE — Telephone Encounter (Signed)
-----   Message from Unk Lightning, [place] sent at 05/27/2018  1:24 PM EST -----   ----- Message ----- From: Benancio Deeds, MD Sent: 05/27/2018  12:52 PM EST To: Unk Lightning, PA    ----- Message ----- From: Unk Lightning, [place] Sent: 05/27/2018  10:09 AM EST To: Benancio Deeds, MD

## 2018-05-27 NOTE — Progress Notes (Signed)
Chief Complaint: Follow-up after hospitalization for gastroparesis  HPI:    [name] is a 28 year old African-American female with a history of uncontrolled diabetes and gastroparesis, chronic diarrhea, perirectal/perianal abscess, who follows with Dr. Adela [name] and returns to clinic today for follow-up after recent hospitalization for gastroparesis.    Recall patient has a long history with Dr. Adela [name].  Please see records for further notes.  She has been referred to [facility] and their gastric motility specialist to discuss gastric pacemaker and has an appointment on March 23rd.    Hospitalization 05/13/2018-05/18/2018 for gastroparesis.  Patient slowly improved on IV erythromycin and Ativan.  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  It was noted that patient is not a candidate for Domperidone and Reglan has not worked in the past.    Today, the patient presents clinic and explains that she has continued with epigastric abdominal pain which is constant for her as well as nausea and episodes of vomiting.  Tells me typically when she wakes up in the morning she will vomit and it is usually what she has tried to eat for dinner the night before.  She continues to use her regular medicines including a mixture of Reglan, Zofran, Phenergan and Pantoprazole.      Most concerning to the patient today is that when she was discharged she started with lower leg edema which goes all the way up to her abdomen.  Describes that she is currently 151 pounds which is 31 pounds more than her baseline weight and this is all "fluid".  Tells me she has had anasarca before after leaving the hospital, but it was "even worse than this".  Tells me she is struggling to move around as she is 30 pounds heavier all of a sudden.  Tells me she is unsure who to follow with about this as she does not have a primary care provider.    Denies fever, chills or  symptoms that awaken her from sleep.  Past Medical History:  Diagnosis Date  . Allergy   . Anemia   . Anxiety   . Blood transfusion without reported diagnosis    Dec 2018  . Cataract    right eye  . Depression   . Diabetes type 1, uncontrolled (HCC) 11/14/2011   Since age 47  . Fibromyalgia   . Gastroparesis   . GERD (gastroesophageal reflux disease)   . Hypertension   . Infection    UTI April 2016    Past Surgical History:  Procedure Laterality Date  . ANKLE SURGERY    . CHOLECYSTECTOMY  11/15/2011   Procedure: LAPAROSCOPIC CHOLECYSTECTOMY WITH INTRAOPERATIVE CHOLANGIOGRAM;  Surgeon: [name], MD;  [facility];  Service: General;  Laterality: N/A;  . COLONOSCOPY    . COLONOSCOPY WITH PROPOFOL N/A 06/27/2017   Procedure: COLONOSCOPY WITH PROPOFOL;  Surgeon: Rachael Fee, MD;  [facility];  Service: Endoscopy;  Laterality: N/A;  . ESOPHAGOGASTRODUODENOSCOPY  12/03/2011   Procedure: ESOPHAGOGASTRODUODENOSCOPY (EGD);  Surgeon: Theda Belfast, MD;  Location: [facility];  Service: Endoscopy;  Laterality: N/A;  . FLEXIBLE SIGMOIDOSCOPY N/A 03/10/2017   Procedure: FLEXIBLE SIGMOIDOSCOPY;  Surgeon: Jeani Hawking, MD;  [facility];  Service: Endoscopy;  Laterality: N/A;  . INCISION AND DRAINAGE PERIRECTAL ABSCESS N/A 03/01/2017   Procedure: IRRIGATION AND DEBRIDEMENT PERIRECTAL ABSCESS;  Surgeon: Ovidio Kin, MD;  [facility];  Service: General;  Laterality:  N/A;  . IRRIGATION AND DEBRIDEMENT BUTTOCKS N/A 03/23/2017   Procedure: IRRIGATION AND DEBRIDEMENT BUTTOCKS, SETON PLACEMENT;  Surgeon: Romie Levee, MD;  [facility];  Service: General;  Laterality: N/A;  . LAPAROSCOPY  11/23/2011   Procedure: LAPAROSCOPY DIAGNOSTIC;  Surgeon: Mariella Saa, MD;  [facility];  Service: General;  Laterality: N/A;  . SIGMOIDOSCOPY    . UPPER GASTROINTESTINAL ENDOSCOPY    . WISDOM TOOTH EXTRACTION      Current Outpatient Medications    Medication Sig Dispense Refill  . feeding supplement, ENSURE ENLIVE, (ENSURE ENLIVE) LIQD Take 237 mLs by mouth 2 (two) times daily between meals. 237 mL 12  . hyoscyamine (LEVSIN SL) 0.125 MG SL tablet Place 1 tablet (0.125 mg total) under the tongue every 6 (six) hours as needed for cramping (abdominal pain). 30 tablet 0  . insulin lispro (HUMALOG) 100 UNIT/ML injection Inject 0-0.09 mLs (0-9 Units total) into the skin 3 (three) times daily with meals. 10 mL 0  . LANTUS 100 UNIT/ML injection Inject 0.12 mLs (12 Units total) into the skin daily. 10 mL 0  . metoCLOPramide (REGLAN) 10 MG tablet Take 1 tablet (10 mg total) by mouth every 6 (six) hours as needed for nausea or vomiting. 20 tablet 0  . mirtazapine (REMERON) 15 MG tablet TAKE 1 TABLET BY MOUTH AT  BEDTIME 90 tablet 1  . ondansetron (ZOFRAN) 4 MG tablet Take 1 tablet (4 mg total) by mouth every 8 (eight) hours as needed for nausea or vomiting (If necessary, dont combine with Reglan.). 20 tablet 0  . pantoprazole (PROTONIX) 40 MG tablet TAKE 1 TABLET BY MOUTH  DAILY 90 tablet 1  . promethazine (PHENERGAN) 12.5 MG tablet Take 12.5 mg by mouth 2 (two) times daily as needed for nausea/vomiting.     No current facility-administered medications for this visit.     Allergies as of 05/27/2018 - Review Complete 05/11/2018  Allergen Reaction Noted  . Other Anaphylaxis 12/13/2011  . Lactose intolerance (gi) Diarrhea 01/22/2015    Family History  Problem Relation Age of Onset  . Diabetes Mother   . Hypertension Father   . Colon cancer Paternal Grandmother        pt thinks PGM was dx in her 49's  . Esophageal cancer Neg Hx   . Liver cancer Neg Hx   . Pancreatic cancer Neg Hx   . Stomach cancer Neg Hx   . Rectal cancer Neg Hx     Social History   Socioeconomic History  . Marital status: Married    Spouse name: [name]  . Number of children: 0  . Years of education: college  . Highest education level: Not on file  Occupational  History  . Occupation: Equities trader call center    Employer: [employer]  Social Needs  . Financial resource strain: Not on file  . Food insecurity:    Worry: Not on file    Inability: Not on file  . Transportation needs:    Medical: Not on file    Non-medical: Not on file  Tobacco Use  . Smoking status: Never Smoker  . Smokeless tobacco: Never Used  Substance and Sexual Activity  . Alcohol use: No    Alcohol/week: 0.0 standard drinks    Frequency: Never  . Drug use: No  . Sexual activity: Not Currently    Partners: Male    Birth control/protection: None  Lifestyle  . Physical activity:    Days per week: Not on  file    Minutes per session: Not on file  . Stress: Not on file  Relationships  . Social connections:    Talks on phone: Not on file    Gets together: Not on file    Attends religious service: Not on file    Active member of club or organization: Not on file    Attends meetings of clubs or organizations: Not on file    Relationship status: Not on file  . Intimate partner violence:    Fear of current or ex partner: Not on file    Emotionally abused: Not on file    Physically abused: Not on file    Forced sexual activity: Not on file  Other Topics Concern  . Not on file  Social History Narrative  . Not on file    Review of Systems:    Constitutional: No weight loss, fever or chills Cardiovascular: No chest pain Respiratory: No SOB  Gastrointestinal: See HPI and otherwise negative   Physical Exam:  Vital signs: BP 136/84   Pulse 98   Ht 5\' 7"  (1.702 m)   Wt 151 lb (68.5 kg)   BMI 23.65 kg/m   Constitutional:   Pleasant AA female appears to be in NAD, Well developed, Well nourished, alert and cooperative Respiratory: Respirations even and unlabored. Lungs clear to auscultation bilaterally.   No wheezes, crackles, or rhonchi.  Cardiovascular: Normal S1, S2. No MRG. Regular rate and rhythm. No peripheral edema, cyanosis or pallor.    Gastrointestinal:  Soft, nondistended, moderate epigastric TTP. No rebound or guarding. Normal bowel sounds. No appreciable masses or hepatomegaly. Rectal:  Not performed.  Extremities: Bilateral lower leg edema 3/4+ up to patient's hips Psychiatric:Demonstrates good judgement and reason without abnormal affect or behaviors.  RELEVANT LABS AND IMAGING: CBC    Component Value Date/Time   WBC 4.6 05/18/2018 0938   RBC 2.87 (L) 05/18/2018 0938   HGB 8.0 (L) 05/18/2018 0938   HCT 25.6 (L) 05/18/2018 0938   PLT 207 05/18/2018 0938   MCV 89.2 05/18/2018 0938   MCH 27.9 05/18/2018 0938   MCHC 31.3 05/18/2018 0938   RDW 12.3 05/18/2018 0938   LYMPHSABS 1.0 05/12/2018 0455   MONOABS 0.1 05/12/2018 0455   EOSABS 0.0 05/12/2018 0455   BASOSABS 0.0 05/12/2018 0455    CMP     Component Value Date/Time   NA 138 05/18/2018 0938   K 3.4 (L) 05/18/2018 0938   CL 107 05/18/2018 0938   CO2 26 05/18/2018 0938   GLUCOSE 147 (H) 05/18/2018 0938   BUN 15 05/18/2018 0938   CREATININE 0.46 05/18/2018 0938   CREATININE 0.48 (L) 11/07/2014 1321   CALCIUM 8.3 (L) 05/18/2018 0938   PROT 5.9 (L) 05/18/2018 0938   ALBUMIN 3.0 (L) 05/18/2018 0938   AST 24 05/18/2018 0938   ALT 16 05/18/2018 0938   ALKPHOS 76 05/18/2018 0938   BILITOT 0.4 05/18/2018 0938   GFRNONAA >60 05/18/2018 0938   GFRAA >60 05/18/2018 0938    Assessment: 1.  Gastroparesis: Long history with [place] and multiple hospitalizations, not a candidate for domperidone and Reglan was not helpful in the past, patient awaiting referral to [facility] to discuss gastric pacemaker 2.  Lower leg edema: Likely related to decreased albumin level/malnutrition and recent fluids given at the hospital  Plan: 1.  Repeat CBC and CMP today; if kidneys are okay, can likely start low-dose Lasix 20 mg daily and recheck CMP in a week 2.  Continue current therapies until being seen at [facility], I will have my nurse contact them to see  if there is any possibility they could get her in before the 23rd of next month  3.  Patient was referred to a primary care provider today 4.  Patient to follow-up with [place] as needed in the future after appointment with [facility]  Hyacinth Meeker, [place] [facility] 05/27/2018, 9:28 AM

## 2018-05-27 NOTE — Progress Notes (Signed)
[name],   Can you start Lasix 40mg qd for the patient and she needs repeat BMP in 2 weeks.   Thanks Elverna Caffee, PA-C 

## 2018-05-27 NOTE — Progress Notes (Signed)
Agree with assessment and plan as outlined. Very difficult situation, limited medical options for her gastroparesis. Hopefully she can keep her appointment as scheduled with [facility] to discuss gastric pacemaker. Otherwise needs to keep good control of diabetes to help this. Her albumin is slightly low but renal function normal. I think would give lasix 47m once daily to start given the amount of edema she has and repeat BMET in 2 weeks. She had an echo in 02/2017 which showed a normal EF.   JAnderson Maltacan you please have the staff contact her regarding the lasix and follow up labs. She also needs to establish with a PCP locally. Thanks

## 2018-05-27 NOTE — Patient Instructions (Addendum)
If you are age 29 or older, your body mass index should be between 23-30. Your Body mass index is 23.65 kg/m. If this is out of the aforementioned range listed, please consider follow up with your Primary Care Provider.  If you are age 38 or younger, your body mass index should be between 19-25. Your Body mass index is 23.65 kg/m. If this is out of the aformentioned range listed, please consider follow up with your Primary Care Provider.   Your provider has requested that you go to the basement level for lab work before leaving today. Press "B" on the elevator. The lab is located at the first door on the left as you exit the elevator. CBC w/ Diff CMET  Start Boost or Ensure supplements.  We are referring you to a Primary Care Physician to establish care. You have been referred to [facility].  Follow up as needed.  Thank you for choosing me and [facility].    Hyacinth Meeker, PA-C

## 2018-05-27 NOTE — Telephone Encounter (Signed)
Willow Reczek,   Can you start Lasix 40mg  qd for the patient and she needs repeat BMP in 2 weeks.   Thanks Hyacinth Meeker, PA-C

## 2018-05-31 ENCOUNTER — Encounter: Payer: Self-pay | Admitting: Family Medicine

## 2018-05-31 ENCOUNTER — Ambulatory Visit (INDEPENDENT_AMBULATORY_CARE_PROVIDER_SITE_OTHER): Payer: 59 | Admitting: Family Medicine

## 2018-05-31 VITALS — BP 116/72 | HR 92 | Temp 98.5°F | Ht 67.0 in | Wt 149.2 lb

## 2018-05-31 DIAGNOSIS — I10 Essential (primary) hypertension: Secondary | ICD-10-CM

## 2018-05-31 DIAGNOSIS — I152 Hypertension secondary to endocrine disorders: Secondary | ICD-10-CM

## 2018-05-31 DIAGNOSIS — E1069 Type 1 diabetes mellitus with other specified complication: Secondary | ICD-10-CM | POA: Diagnosis not present

## 2018-05-31 DIAGNOSIS — K219 Gastro-esophageal reflux disease without esophagitis: Secondary | ICD-10-CM

## 2018-05-31 DIAGNOSIS — K3184 Gastroparesis: Secondary | ICD-10-CM | POA: Diagnosis not present

## 2018-05-31 DIAGNOSIS — E43 Unspecified severe protein-calorie malnutrition: Secondary | ICD-10-CM | POA: Diagnosis not present

## 2018-05-31 DIAGNOSIS — F419 Anxiety disorder, unspecified: Secondary | ICD-10-CM

## 2018-05-31 DIAGNOSIS — R609 Edema, unspecified: Secondary | ICD-10-CM

## 2018-05-31 DIAGNOSIS — F330 Major depressive disorder, recurrent, mild: Secondary | ICD-10-CM

## 2018-05-31 DIAGNOSIS — E1159 Type 2 diabetes mellitus with other circulatory complications: Secondary | ICD-10-CM | POA: Diagnosis not present

## 2018-05-31 MED ORDER — LANTUS 100 UNIT/ML ~~LOC~~ SOLN: 12.0000 [IU] | Freq: Every day | SUBCUTANEOUS | 0 refills | Status: DC

## 2018-05-31 MED ORDER — MIRTAZAPINE 30 MG PO TABS: 30.0000 mg | ORAL_TABLET | Freq: Every day | ORAL | 5 refills | Status: DC

## 2018-05-31 MED ORDER — LORAZEPAM 0.5 MG PO TABS: 0.5000 mg | ORAL_TABLET | Freq: Two times a day (BID) | ORAL | 2 refills | Status: DC | PRN

## 2018-05-31 NOTE — Progress Notes (Signed)
Chief Complaint:  [name] is a 28 y.o. female who presents today with a chief complaint of anxiety/depression and to establish care.   Assessment/Plan:  Anxiety Database reviewed with no red flags.  We will increase her dose of Remeron to 30 mg nightly.  Also started low-dose Ativan 0.5mg  bid prn given that this also helped with her gastroparesis in the past.  Discussed potential long-term side effects of this medication including dependency and respiratory depression.  Discussed with patient this would not be a long-term medication that we would only use for the shortest course needed.  Patient voiced understanding.  She will follow-up with me in 3 months.  Peripheral edema Hopefully will improve as her nutrition improves.  Continue Lasix 40 mg daily.  Diabetes mellitus type 1 (HCC) We will refill her Lantus today.  Advised her to follow-up with endocrinology soon.  Discussed importance of good glycemic control regarding management of her gastroparesis.  Protein-calorie malnutrition, severe (HCC) She is up about 5 pounds over the last few weeks.  Hopefully increasing her Remeron to 30 mg daily will also increase her appetite.  Continue protein shake supplementation.  MDD (major depressive disorder), recurrent episode, mild (HCC) PHQ score elevated.  Will increase Remeron to 30 mg at night.  Follow-up with me in 3 months.  Hypertension associated with diabetes (HCC) At goal.  Continue losartan 25 mg daily.  Gastroparesis No current symptoms.  We will start low-dose Ativan to help control her anxiety as well.  She will continue her other antiemetics as per GI.  Has scheduled appointment at [facility] soon to discuss possible gastric pacemaker placement.  GERD (gastroesophageal reflux disease) Stable.  Continue Protonix 40 mg daily.    Subjective:  HPI:  Depression/anxiety Chronic problem.  Several year history.  She has been on several medications in the past.  She is  currently on Remeron 15 mg daily and thinks this is working okay.  She has been on this for several weeks and has noted significant improvement in her mood as well as her appetite levels.  She has several other underlying chronic illnesses including type 1 diabetes and severe gastroparesis.  She has been admitted to the hospital 4 times in the last 6 months due to these issues.  She notes that her underlying medical conditions significantly dry of her anxiety and depression.  Also thinks that untreated anxiety and depression make it more difficult for her to manage her blood sugars.  During her most recent admission, she was started on scheduled Ativan for gastroparesis and had significant improvement in her anxiety symptoms as well.  Depression screen PHQ 2/9 05/31/2018  Decreased Interest 1  Down, Depressed, Hopeless 1  PHQ - 2 Score 2  Altered sleeping 2  Tired, decreased energy 3  Change in appetite 3  Feeling bad or failure about yourself  2  Trouble concentrating 1  Moving slowly or fidgety/restless 2  Suicidal thoughts 1  PHQ-9 Score 16  Some recent data might be hidden    GAD 7 : Generalized Anxiety Score 05/31/2018  Nervous, Anxious, on Edge 2  Control/stop worrying 3  Worry too much - different things 2  Trouble relaxing 3  Restless 2  Easily annoyed or irritable 3  Afraid - awful might happen 3  Total GAD 7 Score 18  Anxiety Difficulty Very difficult   Her other chronic medical conditions are outlined below  #Type 1 diabetes -Follows with endocrinology however has not seen them for  several months. -On Lantus 12 units daily and Humalog 0 to 9 units 3 times daily with meals.  #Gastroparesis  -Currently follows with GI and is undergoing evaluation at [facility] for possible GI pacemaker -She currently takes Reglan, Zofran, and Phenergan as needed for nausea.  She also takes hyoscyamine as needed for abdominal cramping. -As noted above she was recently hospitalized and given  erythromycin and IV Ativan with significant improvement in her symptoms. -She is not currently having any symptoms.  # GERD -On Protonix 40 mg daily and tolerating well  # Essential Hypertension -On losartan 25 mg daily and tolerating well. -ROS: No reported chest pain or shortness of breath  # Peripheral Edema -On Lasix 40 mg daily.  # Protein Calorie Malnutrition -Uses supplemental shakes 1-2 times daily.  ROS: Per HPI, otherwise a complete review of systems was negative.   PMH:  The following were reviewed and entered/updated in epic: Past Medical History:  Diagnosis Date  . Allergy   . Anemia   . Anxiety   . Blood transfusion without reported diagnosis    Dec 2018  . Cataract    right eye  . Depression   . Diabetes type 1, uncontrolled (HCC) 11/14/2011   Since age 50  . Fibromyalgia   . Gastroparesis   . GERD (gastroesophageal reflux disease)   . Hypertension   . Infection    UTI April 2016   Patient Active Problem List   Diagnosis Date Noted  . Anxiety 05/31/2018  . Peripheral edema 05/31/2018  . Normocytic anemia 07/10/2017  . GERD (gastroesophageal reflux disease) 06/23/2017  . 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 Diabetes mellitus type 1 (HCC) 04/28/2013  . Protein-calorie malnutrition, severe (HCC) 04/28/2013  . Hypertension associated with diabetes (HCC) 11/14/2011  . Gastroparesis 11/14/2011   Past Surgical History:  Procedure Laterality Date  . ANKLE SURGERY    . CHOLECYSTECTOMY  11/15/2011   Procedure: LAPAROSCOPIC CHOLECYSTECTOMY WITH INTRAOPERATIVE CHOLANGIOGRAM;  Surgeon: [name], MD;  [facility];  Service: General;  Laterality: N/A;  . COLONOSCOPY    . COLONOSCOPY WITH PROPOFOL N/A 06/27/2017   Procedure: COLONOSCOPY WITH PROPOFOL;  Surgeon: Rachael Fee, MD;  [facility];  Service: Endoscopy;  Laterality: N/A;  . ESOPHAGOGASTRODUODENOSCOPY  12/03/2011   Procedure:  ESOPHAGOGASTRODUODENOSCOPY (EGD);  Surgeon: Theda Belfast, MD;  Location: [facility];  Service: Endoscopy;  Laterality: N/A;  . FLEXIBLE SIGMOIDOSCOPY N/A 03/10/2017   Procedure: FLEXIBLE SIGMOIDOSCOPY;  Surgeon: Jeani Hawking, MD;  [facility];  Service: Endoscopy;  Laterality: N/A;  . INCISION AND DRAINAGE PERIRECTAL ABSCESS N/A 03/01/2017   Procedure: IRRIGATION AND DEBRIDEMENT PERIRECTAL ABSCESS;  Surgeon: Ovidio Kin, MD;  [facility];  Service: General;  Laterality: N/A;  . IRRIGATION AND DEBRIDEMENT BUTTOCKS N/A 03/23/2017   Procedure: IRRIGATION AND DEBRIDEMENT BUTTOCKS, SETON PLACEMENT;  Surgeon: Romie Levee, MD;  [facility];  Service: General;  Laterality: N/A;  . LAPAROSCOPY  11/23/2011   Procedure: LAPAROSCOPY DIAGNOSTIC;  Surgeon: Mariella Saa, MD;  [facility];  Service: General;  Laterality: N/A;  . SIGMOIDOSCOPY    . UPPER GASTROINTESTINAL ENDOSCOPY    . WISDOM TOOTH EXTRACTION      Family History  Problem Relation Age of Onset  . Diabetes Mother   . Hypertension Father   . Colon cancer Paternal Grandmother        pt thinks PGM was dx in her 75's  . Diabetes Paternal Grandmother   .  Diabetes Maternal Grandmother   . Diabetes Maternal Grandfather   . Diabetes Paternal Grandfather   . Diabetes Other   . Esophageal cancer Neg Hx   . Liver cancer Neg Hx   . Pancreatic cancer Neg Hx   . Stomach cancer Neg Hx   . Rectal cancer Neg Hx     Medications- reviewed and updated Current Outpatient Medications  Medication Sig Dispense Refill  . feeding supplement, ENSURE ENLIVE, (ENSURE ENLIVE) LIQD Take 237 mLs by mouth 2 (two) times daily between meals. 237 mL 12  . furosemide (LASIX) 40 MG tablet Take 1 tablet (40 mg total) by mouth daily for 30 days. 30 tablet 3  . hyoscyamine (LEVSIN SL) 0.125 MG SL tablet Place 1 tablet (0.125 mg total) under the tongue every 6 (six) hours as needed for cramping (abdominal pain). 30 tablet 0  .  insulin lispro (HUMALOG) 100 UNIT/ML injection Inject 0-0.09 mLs (0-9 Units total) into the skin 3 (three) times daily with meals. 10 mL 0  . LANTUS 100 UNIT/ML injection Inject 0.12 mLs (12 Units total) into the skin daily. 10 mL 0  . losartan (COZAAR) 25 MG tablet Take 25 mg by mouth daily.    . metoCLOPramide (REGLAN) 10 MG tablet Take 1 tablet (10 mg total) by mouth every 6 (six) hours as needed for nausea or vomiting. 20 tablet 0  . ondansetron (ZOFRAN) 4 MG tablet Take 1 tablet (4 mg total) by mouth every 8 (eight) hours as needed for nausea or vomiting (If necessary, dont combine with Reglan.). 20 tablet 0  . pantoprazole (PROTONIX) 40 MG tablet TAKE 1 TABLET BY MOUTH  DAILY 90 tablet 1  . promethazine (PHENERGAN) 12.5 MG tablet Take 12.5 mg by mouth 2 (two) times daily as needed for nausea/vomiting.    Marland Kitchen LORazepam (ATIVAN) 0.5 MG tablet Take 1 tablet (0.5 mg total) by mouth 2 (two) times daily as needed for anxiety. 30 tablet 2  . mirtazapine (REMERON) 30 MG tablet Take 1 tablet (30 mg total) by mouth at bedtime. 30 tablet 5   No current facility-administered medications for this visit.     Allergies-reviewed and updated Allergies  Allergen Reactions  . Other Anaphylaxis    Reaction to [place] nuts   . Lactose Intolerance (Gi) Diarrhea    Social History   Socioeconomic History  . Marital status: Married    Spouse name: sergio  . Number of children: 0  . Years of education: college  . Highest education level: Not on file  Occupational History  . Occupation: Equities trader call center    Employer: [employer]  Social Needs  . Financial resource strain: Not on file  . Food insecurity:    Worry: Not on file    Inability: Not on file  . Transportation needs:    Medical: Not on file    Non-medical: Not on file  Tobacco Use  . Smoking status: Never Smoker  . Smokeless tobacco: Never Used  Substance and Sexual Activity  . Alcohol use: No    Alcohol/week: 0.0  standard drinks    Frequency: Never  . Drug use: No  . Sexual activity: Not Currently    Partners: Male    Birth control/protection: None  Lifestyle  . Physical activity:    Days per week: Not on file    Minutes per session: Not on file  . Stress: Not on file  Relationships  . Social connections:    Talks on phone: Not on  file    Gets together: Not on file    Attends religious service: Not on file    Active member of club or organization: Not on file    Attends meetings of clubs or organizations: Not on file    Relationship status: Not on file  Other Topics Concern  . Not on file  Social History Narrative  . Not on file         Objective:  Physical Exam: BP 116/72 (BP Location: Left Arm, Patient Position: Sitting, Cuff Size: Normal)   Pulse 92   Temp 98.5 F (36.9 C) (Oral)   Ht 5\' 7"  (1.702 m)   Wt 149 lb 3.2 oz (67.7 kg)   LMP 04/01/2018   SpO2 97%   BMI 23.37 kg/m   Wt Readings from Last 3 Encounters:  05/31/18 149 lb 3.2 oz (67.7 kg)  05/27/18 151 lb (68.5 kg)  05/18/18 144 lb 14.4 oz (65.7 kg)    Gen: NAD, resting comfortably CV: Regular rate and rhythm with no murmurs appreciated Pulm: Normal work of breathing, clear to auscultation bilaterally with no crackles, wheezes, or rhonchi GI: Normal bowel sounds present. Soft, Nontender, Nondistended. MSK: 3+ pitting edema in bilateral lower extremities Skin: Warm, dry Neuro: Grossly normal, moves all extremities Psych: Normal affect and thought content      Basha Krygier M. Jimmey Ralph, MD 05/31/2018 12:13 PM

## 2018-05-31 NOTE — Assessment & Plan Note (Signed)
PHQ score elevated.  Will increase Remeron to 30 mg at night.  Follow-up with me in 3 months.

## 2018-05-31 NOTE — Assessment & Plan Note (Signed)
At goal.  Continue losartan 25 mg daily. 

## 2018-05-31 NOTE — Assessment & Plan Note (Signed)
No current symptoms.  We will start low-dose Ativan to help control her anxiety as well.  She will continue her other antiemetics as per GI.  Has scheduled appointment at [facility] soon to discuss possible gastric pacemaker placement.

## 2018-05-31 NOTE — Assessment & Plan Note (Signed)
Stable.  Continue Protonix 40 mg daily

## 2018-05-31 NOTE — Patient Instructions (Signed)
It was very nice to see you today!  Please increase your Remeron to 30 mg nightly.  We will start low-dose Ativan.  I do not want this to be a long term medication.  Hopefully we will will be able to wean off over the next several months.  Please follow-up with Dr. Everardo All soon.  I will refill your Lantus today.  Come back to see me in 3 months, or sooner as needed.  Take care, Dr Jimmey Ralph

## 2018-05-31 NOTE — Assessment & Plan Note (Signed)
Database reviewed with no red flags.  We will increase her dose of Remeron to 30 mg nightly.  Also started low-dose Ativan 0.5mg  bid prn given that this also helped with her gastroparesis in the past.  Discussed potential long-term side effects of this medication including dependency and respiratory depression.  Discussed with patient this would not be a long-term medication that we would only use for the shortest course needed.  Patient voiced understanding.  She will follow-up with me in 3 months.

## 2018-05-31 NOTE — Assessment & Plan Note (Signed)
Hopefully will improve as her nutrition improves.  Continue Lasix 40 mg daily.

## 2018-05-31 NOTE — Assessment & Plan Note (Signed)
We will refill her Lantus today.  Advised her to follow-up with endocrinology soon.  Discussed importance of good glycemic control regarding management of her gastroparesis.

## 2018-05-31 NOTE — Assessment & Plan Note (Signed)
She is up about 5 pounds over the last few weeks.  Hopefully increasing her Remeron to 30 mg daily will also increase her appetite.  Continue protein shake supplementation.

## 2018-06-03 ENCOUNTER — Emergency Department (HOSPITAL_COMMUNITY)
Admission: EM | Admit: 2018-06-03 | Discharge: 2018-06-03 | Disposition: A | Discharge status: Home / Self Care | Payer: 59 | Source: Home / Self Care | Attending: Emergency Medicine | Admitting: Emergency Medicine

## 2018-06-03 ENCOUNTER — Encounter (HOSPITAL_COMMUNITY): Payer: Self-pay | Admitting: Emergency Medicine

## 2018-06-03 ENCOUNTER — Ambulatory Visit (INDEPENDENT_AMBULATORY_CARE_PROVIDER_SITE_OTHER): Payer: 59 | Admitting: Physician Assistant

## 2018-06-03 ENCOUNTER — Encounter: Payer: Self-pay | Admitting: Physician Assistant

## 2018-06-03 ENCOUNTER — Other Ambulatory Visit: Payer: Self-pay

## 2018-06-03 ENCOUNTER — Telehealth: Payer: Self-pay | Admitting: *Deleted

## 2018-06-03 VITALS — BP 78/60 | HR 94 | Temp 98.5°F | Ht 67.0 in | Wt 128.5 lb

## 2018-06-03 DIAGNOSIS — R109 Unspecified abdominal pain: Secondary | ICD-10-CM

## 2018-06-03 DIAGNOSIS — Z794 Long term (current) use of insulin: Secondary | ICD-10-CM | POA: Insufficient documentation

## 2018-06-03 DIAGNOSIS — E1065 Type 1 diabetes mellitus with hyperglycemia: Secondary | ICD-10-CM | POA: Insufficient documentation

## 2018-06-03 DIAGNOSIS — I10 Essential (primary) hypertension: Secondary | ICD-10-CM | POA: Insufficient documentation

## 2018-06-03 DIAGNOSIS — Z79899 Other long term (current) drug therapy: Secondary | ICD-10-CM

## 2018-06-03 DIAGNOSIS — I959 Hypotension, unspecified: Secondary | ICD-10-CM

## 2018-06-03 DIAGNOSIS — R197 Diarrhea, unspecified: Secondary | ICD-10-CM

## 2018-06-03 DIAGNOSIS — E860 Dehydration: Secondary | ICD-10-CM | POA: Insufficient documentation

## 2018-06-03 DIAGNOSIS — R112 Nausea with vomiting, unspecified: Secondary | ICD-10-CM

## 2018-06-03 DIAGNOSIS — E1069 Type 1 diabetes mellitus with other specified complication: Secondary | ICD-10-CM | POA: Diagnosis not present

## 2018-06-03 DIAGNOSIS — R634 Abnormal weight loss: Secondary | ICD-10-CM | POA: Insufficient documentation

## 2018-06-03 LAB — COMPREHENSIVE METABOLIC PANEL WITH GFR
ALT: 20 U/L (ref 0–44)
AST: 35 U/L (ref 15–41)
Albumin: 2.9 g/dL — ABNORMAL LOW (ref 3.5–5.0)
Alkaline Phosphatase: 97 U/L (ref 38–126)
Anion gap: 13 (ref 5–15)
BUN: 21 mg/dL — ABNORMAL HIGH (ref 6–20)
CO2: 30 mmol/L (ref 22–32)
Calcium: 9.4 mg/dL (ref 8.9–10.3)
Chloride: 92 mmol/L — ABNORMAL LOW (ref 98–111)
Creatinine, Ser: 1.05 mg/dL — ABNORMAL HIGH (ref 0.44–1.00)
GFR calc Af Amer: >60 mL/min
GFR calc non Af Amer: >60 mL/min
Glucose, Bld: 258 mg/dL — ABNORMAL HIGH (ref 70–99)
Potassium: 4 mmol/L (ref 3.5–5.1)
Sodium: 135 mmol/L (ref 135–145)
Total Bilirubin: 0.6 mg/dL (ref 0.3–1.2)
Total Protein: 7.2 g/dL (ref 6.5–8.1)

## 2018-06-03 LAB — CBC WITH DIFFERENTIAL/PLATELET
Abs Immature Granulocytes: 0.01 K/uL (ref 0.00–0.07)
Basophils Absolute: 0 K/uL (ref 0.0–0.1)
Basophils Relative: 1 %
Eosinophils Absolute: 0.1 K/uL (ref 0.0–0.5)
Eosinophils Relative: 3 %
HCT: 29.7 % — ABNORMAL LOW (ref 36.0–46.0)
Hemoglobin: 9.3 g/dL — ABNORMAL LOW (ref 12.0–15.0)
Immature Granulocytes: 0 %
Lymphocytes Relative: 49 %
Lymphs Abs: 2.4 K/uL (ref 0.7–4.0)
MCH: 27.6 pg (ref 26.0–34.0)
MCHC: 31.3 g/dL (ref 30.0–36.0)
MCV: 88.1 fL (ref 80.0–100.0)
Monocytes Absolute: 0.3 K/uL (ref 0.1–1.0)
Monocytes Relative: 6 %
Neutro Abs: 2 K/uL (ref 1.7–7.7)
Neutrophils Relative %: 41 %
Platelets: 300 K/uL (ref 150–400)
RBC: 3.37 MIL/uL — ABNORMAL LOW (ref 3.87–5.11)
RDW: 13.4 % (ref 11.5–15.5)
WBC: 4.9 K/uL (ref 4.0–10.5)
nRBC: 0 % (ref 0.0–0.2)

## 2018-06-03 LAB — CK: Total CK: 49 U/L (ref 38–234)

## 2018-06-03 LAB — URINALYSIS, ROUTINE W REFLEX MICROSCOPIC
Bilirubin Urine: NEGATIVE
Glucose, UA: >=500 mg/dL — AB
Ketones, ur: NEGATIVE mg/dL
Leukocytes,Ua: NEGATIVE
Nitrite: NEGATIVE
Protein, ur: 30 mg/dL — AB
Specific Gravity, Urine: 1.011 (ref 1.005–1.030)
pH: 6 (ref 5.0–8.0)

## 2018-06-03 LAB — POCT I-STAT EG7
Acid-Base Excess: 7 mmol/L — ABNORMAL HIGH (ref 0.0–2.0)
Bicarbonate: 33.9 mmol/L — ABNORMAL HIGH (ref 20.0–28.0)
Calcium, Ion: 1.19 mmol/L (ref 1.15–1.40)
HCT: 46 % (ref 36.0–46.0)
Hemoglobin: 15.6 g/dL — ABNORMAL HIGH (ref 12.0–15.0)
O2 Saturation: 68 %
Potassium: 3.8 mmol/L (ref 3.5–5.1)
Sodium: 132 mmol/L — ABNORMAL LOW (ref 135–145)
TCO2: 36 mmol/L — ABNORMAL HIGH (ref 22–32)
pCO2, Ven: 57.9 mmHg (ref 44.0–60.0)
pH, Ven: 7.376 (ref 7.250–7.430)
pO2, Ven: 38 mmHg (ref 32.0–45.0)

## 2018-06-03 LAB — LACTIC ACID, PLASMA
Lactic Acid, Venous: 3.6 mmol/L (ref 0.5–1.9)
Lactic Acid, Venous: 0.8 mmol/L (ref 0.5–1.9)

## 2018-06-03 LAB — I-STAT BETA HCG BLOOD, ED (MC, WL, AP ONLY): I-stat hCG, quantitative: <5 m[IU]/mL

## 2018-06-03 LAB — LIPASE, BLOOD: Lipase: 36 U/L (ref 11–51)

## 2018-06-03 LAB — POCT GLUCOSE (DEVICE FOR HOME USE): POC Glucose: 428 mg/dl — AB (ref 70–99)

## 2018-06-03 LAB — CBG MONITORING, ED: Glucose-Capillary: 298 mg/dL — ABNORMAL HIGH (ref 70–99)

## 2018-06-03 MED ORDER — PROMETHAZINE HCL 25 MG/ML IJ SOLN
25.0000 mg | Freq: Once | INTRAMUSCULAR | Status: AC
  Administered 2018-06-03: 25 mg via INTRAVENOUS
  Filled 2018-06-03: qty 1

## 2018-06-03 MED ORDER — DICYCLOMINE HCL 20 MG PO TABS: 20.0000 mg | ORAL_TABLET | Freq: Three times a day (TID) | ORAL | 0 refills | Status: DC | PRN

## 2018-06-03 MED ORDER — SODIUM CHLORIDE 0.9 % IV BOLUS
1000.0000 mL | Freq: Once | INTRAVENOUS | Status: AC
  Administered 2018-06-03: 1000 mL via INTRAVENOUS

## 2018-06-03 MED ORDER — DICYCLOMINE HCL 10 MG PO CAPS
10.0000 mg | ORAL_CAPSULE | Freq: Once | ORAL | Status: AC
  Administered 2018-06-03: 10 mg via ORAL
  Filled 2018-06-03: qty 1

## 2018-06-03 MED ORDER — FENTANYL CITRATE (PF) 100 MCG/2ML IJ SOLN
50.0000 ug | Freq: Once | INTRAMUSCULAR | Status: AC
  Administered 2018-06-03: 50 ug via INTRAVENOUS
  Filled 2018-06-03: qty 2

## 2018-06-03 MED ORDER — LANTUS 100 UNIT/ML ~~LOC~~ SOLN: 12.0000 [IU] | Freq: Every day | SUBCUTANEOUS | 0 refills | Status: DC

## 2018-06-03 NOTE — Discharge Instructions (Signed)
You have been seen in the Emergency Department (ED) today for nausea and vomiting.  Your work up today has not shown a clear cause for your symptoms.  Use your nausea medications as home along with the Bentyl prescribed here for abdominal cramping pain.   Follow up with your doctor as soon as possible regarding todays emergent visit and your symptoms of nausea.   Return to the ED if you develop abdominal, bloody vomiting, bloody diarrhea, if you are unable to tolerate fluids due to vomiting, or if you develop other symptoms that concern you.

## 2018-06-03 NOTE — ED Provider Notes (Signed)
Blood pressure 110/75, temperature 97.8 F (36.6 C), temperature source Oral.  Assuming care from Dr. Sherry Ruffing.  In short, [name] is a 28 y.o. female with a chief complaint of Hypotension .  Refer to the original H&P for additional details.  The current plan of care is to follow up repeat lactate, PO challenge, and reassess.  06:08 PM Patient reports feeling better.  She is having some abdominal cramping but feels ready for discharge home.  No additional hypotension after IV fluids.  Lactate repeated and has normalized.  Doubt sepsis.  Plan for discharge home with Bentyl, GI follow-up, and home nausea medications.    Margette Fast, MD 06/03/18 [phone]

## 2018-06-03 NOTE — Progress Notes (Signed)
[name] is 27 y/o female who  has a past medical history of Allergy, Anemia, Anxiety, Blood transfusion without reported diagnosis, Cataract, Depression, Diabetes type 1, uncontrolled ([facility]) (11/14/2011), Fibromyalgia, Gastroparesis, GERD (gastroesophageal reflux disease), Hypertension, and Infection.  She presents today for dizziness and blood pressure changes.  She went to the dentist this morning for a procedure and BP was 163/116 and 188/100.  Blood pressure in office today on check-in was 78/60.  Recheck was 80/50.  BP Readings from Last 3 Encounters:  06/03/18 (!) 78/60  05/31/18 116/72  05/27/18 136/84   She also has lower leg swelling bilaterally, but states that is improved since earlier this week when she saw her PCP.  She states that she had nausea and vomiting yesterday with limited fluids.  She states that she has had a little bit of fluids today.  She did take her blood pressure medication this morning.  She states that she does have a history of requiring requiring IV fluids for dehydration.  Blood sugar in the office today was greater than 400.  She tells me that she has had some insulin recently.  She is unable to tell me when.  Results for orders placed or performed in visit on 06/03/18  POCT Glucose (Device for Home Use)  Result Value Ref Range   POC Glucose 428 (A) 70 - 99 mg/dl    I did not perform a physical physical exam on patient today.  She was with a friend who offered to take her to the ER for further evaluation.  Patient verbalized understanding and was agreeable to plan.  Jarold Motto PA-C

## 2018-06-03 NOTE — Telephone Encounter (Signed)
Spoke to pt, asked her what is going on with her blood pressure? Pt said she went to have teeth pulled this morning at the dentist and her blood pressure was elevated  163/116 and then she became more nervous and recheck was 188/100? Asked pt if she took her blood pressure medicine prior to going to the dentist? Pt said yes early. Asked her iif she rechecked blood pressure since home? Pt said no she does not have anything to check with. Asked pt if she is having and headache, dizziness, blurred vision or SOB? Pt denied all symptoms. Asked pt if drinking water? Pt said yes, she had vomiting yesterday x 3 and is drinking today. Pt said she has Hx of Gatroparesis and had a bad day yesterday. Told pt okay we will see you at 11:20. Pt verbalized understanding.

## 2018-06-03 NOTE — ED Notes (Signed)
ED Provider at bedside. 

## 2018-06-03 NOTE — ED Provider Notes (Signed)
[facility] EMERGENCY DEPARTMENT Provider Note   CSN: [id number] Arrival date & time: 06/03/18  1204    History   Chief Complaint Chief Complaint  Patient presents with  . Hypotension    HPI [name] is a 28 y.o. female.     The history is provided by the patient, medical records and the spouse. No language interpreter was used.  Emesis  Severity:  Severe Duration:  3 days Timing:  Constant Quality:  Stomach contents Progression:  Unchanged Chronicity:  Recurrent Recent urination:  Normal Relieved by:  Nothing Worsened by:  Nothing Ineffective treatments:  Antiemetics Associated symptoms: abdominal pain and diarrhea   Associated symptoms: no chills, no cough, no fever, no headaches, no myalgias and no URI   Diarrhea:    Quality:  Watery   Severity:  Severe   Duration:  1 week   Timing:  Constant Risk factors: diabetes     Past Medical History:  Diagnosis Date  . Allergy   . Anemia   . Anxiety   . Blood transfusion without reported diagnosis    Dec 2018  . Cataract    right eye  . Depression   . Diabetes type 1, uncontrolled ([place]) 11/14/2011   Since age 55  . Fibromyalgia   . Gastroparesis   . GERD (gastroesophageal reflux disease)   . Hypertension   . Infection    UTI April 2016    Patient Active Problem List   Diagnosis Date Noted  . Anxiety 05/31/2018  . Peripheral edema 05/31/2018  . Normocytic anemia 07/10/2017  . GERD (gastroesophageal reflux disease) 06/23/2017  . 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 Diabetes mellitus type 1 ([place]) 04/28/2013  . Protein-calorie malnutrition, severe ([place]) 04/28/2013  . Hypertension associated with diabetes ([place]) 11/14/2011  . Gastroparesis 11/14/2011    Past Surgical History:  Procedure Laterality Date  . ANKLE SURGERY    . CHOLECYSTECTOMY  11/15/2011   Procedure: LAPAROSCOPIC CHOLECYSTECTOMY WITH INTRAOPERATIVE CHOLANGIOGRAM;  Surgeon:  [name], MD;  Location: [facility];  Service: General;  Laterality: N/A;  . COLONOSCOPY    . COLONOSCOPY WITH PROPOFOL N/A 06/27/2017   Procedure: COLONOSCOPY WITH PROPOFOL;  Surgeon: Milus Banister, MD;  Location: [facility];  Service: Endoscopy;  Laterality: N/A;  . ESOPHAGOGASTRODUODENOSCOPY  12/03/2011   Procedure: ESOPHAGOGASTRODUODENOSCOPY (EGD);  Surgeon: Beryle Beams, MD;  Location: [facility];  Service: Endoscopy;  Laterality: N/A;  . FLEXIBLE SIGMOIDOSCOPY N/A 03/10/2017   Procedure: FLEXIBLE SIGMOIDOSCOPY;  Surgeon: Carol Ada, MD;  Location: [facility];  Service: Endoscopy;  Laterality: N/A;  . INCISION AND DRAINAGE PERIRECTAL ABSCESS N/A 03/01/2017   Procedure: IRRIGATION AND DEBRIDEMENT PERIRECTAL ABSCESS;  Surgeon: Alphonsa Overall, MD;  Location: [facility];  Service: General;  Laterality: N/A;  . IRRIGATION AND DEBRIDEMENT BUTTOCKS N/A 03/23/2017   Procedure: IRRIGATION AND DEBRIDEMENT BUTTOCKS, SETON PLACEMENT;  Surgeon: [name], MD;  Location: [facility];  Service: General;  Laterality: N/A;  . LAPAROSCOPY  11/23/2011   Procedure: LAPAROSCOPY DIAGNOSTIC;  Surgeon: Edward Jolly, MD;  Location: [facility];  Service: General;  Laterality: N/A;  . SIGMOIDOSCOPY    . UPPER GASTROINTESTINAL ENDOSCOPY    . WISDOM TOOTH EXTRACTION       OB History    Gravida  1   Para  1   Term      Preterm  1   AB      Living  1  SAB      TAB      Ectopic      Multiple  0   Live Births  1            Home Medications    Prior to Admission medications   Medication Sig Start Date End Date Taking? Authorizing Provider  feeding supplement, ENSURE ENLIVE, (ENSURE ENLIVE) LIQD Take 237 mLs by mouth 2 (two) times daily between meals. 05/18/18   Georgette Shell, MD  furosemide (LASIX) 40 MG tablet Take 1 tablet (40 mg total) by mouth daily for 30 days. 05/27/18 06/26/18  Levin Erp, PA  hyoscyamine (LEVSIN SL) 0.125 MG SL tablet Place 1 tablet  (0.125 mg total) under the tongue every 6 (six) hours as needed for cramping (abdominal pain). 02/20/18   Shelly Coss, MD  insulin lispro (HUMALOG) 100 UNIT/ML injection Inject 0-0.09 mLs (0-9 Units total) into the skin 3 (three) times daily with meals. 02/20/18   Shelly Coss, MD  LANTUS 100 UNIT/ML injection Inject 0.12 mLs (12 Units total) into the skin daily. Patient using Vials 06/03/18   [name], MD  LORazepam (ATIVAN) 0.5 MG tablet Take 1 tablet (0.5 mg total) by mouth 2 (two) times daily as needed for anxiety. 05/31/18   [name], MD  losartan (COZAAR) 25 MG tablet Take 25 mg by mouth daily.    [provider]  metoCLOPramide (REGLAN) 10 MG tablet Take 1 tablet (10 mg total) by mouth every 6 (six) hours as needed for nausea or vomiting. 02/20/18   Shelly Coss, MD  mirtazapine (REMERON) 30 MG tablet Take 1 tablet (30 mg total) by mouth at bedtime. 05/31/18   [name], MD  ondansetron (ZOFRAN) 4 MG tablet Take 1 tablet (4 mg total) by mouth every 8 (eight) hours as needed for nausea or vomiting (If necessary, dont combine with Reglan.). 03/25/18   Amin, Jeanella Flattery, MD  pantoprazole (PROTONIX) 40 MG tablet TAKE 1 TABLET BY MOUTH  DAILY 05/04/18   Armbruster, Carlota Raspberry, MD  promethazine (PHENERGAN) 12.5 MG tablet Take 12.5 mg by mouth 2 (two) times daily as needed for nausea/vomiting. 03/11/18   [provider]    Family History Family History  Problem Relation Age of Onset  . Diabetes Mother   . Hypertension Father   . Colon cancer Paternal Grandmother        pt thinks PGM was dx in her 84's  . Diabetes Paternal Grandmother   . Diabetes Maternal Grandmother   . Diabetes Maternal Grandfather   . Diabetes Paternal Grandfather   . Diabetes Other   . Esophageal cancer Neg Hx   . Liver cancer Neg Hx   . Pancreatic cancer Neg Hx   . Stomach cancer Neg Hx   . Rectal cancer Neg Hx     Social History Social History   Tobacco Use  . Smoking  status: Never Smoker  . Smokeless tobacco: Never Used  Substance Use Topics  . Alcohol use: No    Alcohol/week: 0.0 standard drinks    Frequency: Never  . Drug use: No     Allergies   Other and Lactose intolerance (gi)   Review of Systems Review of Systems  Constitutional: Negative for chills and fever.  HENT: Negative for congestion.   Respiratory: Negative for cough, chest tightness, shortness of breath, wheezing and stridor.   Cardiovascular: Negative for chest pain and leg swelling.  Gastrointestinal: Positive for abdominal pain, diarrhea, nausea  and vomiting. Negative for abdominal distention and constipation.  Genitourinary: Negative for dysuria, flank pain, vaginal bleeding, vaginal discharge and vaginal pain.  Musculoskeletal: Negative for back pain, myalgias, neck pain and neck stiffness.  Neurological: Positive for light-headedness. Negative for headaches.  Psychiatric/Behavioral: Negative for agitation.  All other systems reviewed and are negative.    Physical Exam Updated Vital Signs BP 110/75   Temp 97.8 F (36.6 C) (Oral)   Physical Exam Vitals signs and nursing note reviewed.  Constitutional:      General: She is not in acute distress.    Appearance: She is well-developed.  HENT:     Head: Normocephalic and atraumatic.     Nose: No congestion or rhinorrhea.     Mouth/Throat:     Mouth: Mucous membranes are dry.     Pharynx: No oropharyngeal exudate or posterior oropharyngeal erythema.  Eyes:     Extraocular Movements: Extraocular movements intact.     Conjunctiva/sclera: Conjunctivae normal.     Pupils: Pupils are equal, round, and reactive to light.  Neck:     Musculoskeletal: Neck supple. No muscular tenderness.  Cardiovascular:     Rate and Rhythm: Regular rhythm. Tachycardia present.     Heart sounds: No murmur.  Pulmonary:     Effort: Pulmonary effort is normal. No respiratory distress.     Breath sounds: Normal breath sounds.  Abdominal:       General: There is no distension.     Palpations: Abdomen is soft.     Tenderness: There is no abdominal tenderness.  Musculoskeletal:        General: No tenderness.     Right lower leg: No edema.     Left lower leg: No edema.  Skin:    General: Skin is warm and dry.     Capillary Refill: Capillary refill takes less than 2 seconds.     Findings: No erythema.  Neurological:     General: No focal deficit present.     Mental Status: She is alert and oriented to person, place, and time.  Psychiatric:        Mood and Affect: Mood normal.      ED Treatments / Results  Labs (all labs ordered are listed, but only abnormal results are displayed) Labs Reviewed  CBC WITH DIFFERENTIAL/PLATELET - Abnormal; Notable for the following components:      Result Value   RBC 3.37 (*)    Hemoglobin 9.3 (*)    HCT 29.7 (*)    All other components within normal limits  COMPREHENSIVE METABOLIC PANEL - Abnormal; Notable for the following components:   Chloride 92 (*)    Glucose, Bld 258 (*)    BUN 21 (*)    Creatinine, Ser 1.05 (*)    Albumin 2.9 (*)    All other components within normal limits  LACTIC ACID, PLASMA - Abnormal; Notable for the following components:   Lactic Acid, Venous 3.6 (*)    All other components within normal limits  CBG MONITORING, ED - Abnormal; Notable for the following components:   Glucose-Capillary 298 (*)    All other components within normal limits  POCT I-STAT EG7 - Abnormal; Notable for the following components:   Bicarbonate 33.9 (*)    TCO2 36 (*)    Acid-Base Excess 7.0 (*)    Sodium 132 (*)    Hemoglobin 15.6 (*)    All other components within normal limits  URINE CULTURE  LIPASE, BLOOD  CK  LACTIC  ACID, PLASMA  URINALYSIS, ROUTINE W REFLEX MICROSCOPIC  I-STAT VENOUS BLOOD GAS, ED  I-STAT BETA HCG BLOOD, ED ([facility], [facility], [facility] ONLY)    EKG None  Radiology No results found.  Procedures Procedures (including critical care time)  CRITICAL  CARE Performed by: Gwenyth Allegra Diannie Willner Total critical care time: 35 minutes Critical care time was exclusive of separately billable procedures and treating other patients. Critical care was necessary to treat or prevent imminent or life-threatening deterioration. Critical care was time spent personally by me on the following activities: development of treatment plan with patient and/or surrogate as well as nursing, discussions with consultants, evaluation of patient's response to treatment, examination of patient, obtaining history from patient or surrogate, ordering and performing treatments and interventions, ordering and review of laboratory studies, ordering and review of radiographic studies, pulse oximetry and re-evaluation of patient's condition.   Medications Ordered in ED Medications  sodium chloride 0.9 % bolus 1,000 mL (0 mLs Intravenous Stopped 06/03/18 1424)  promethazine (PHENERGAN) injection 25 mg (25 mg Intravenous Given 06/03/18 1252)  fentaNYL (SUBLIMAZE) injection 50 mcg (50 mcg Intravenous Given 06/03/18 1416)  sodium chloride 0.9 % bolus 1,000 mL (1,000 mLs Intravenous New Bag/Given 06/03/18 1609)  fentaNYL (SUBLIMAZE) injection 50 mcg (50 mcg Intravenous Given 06/03/18 1604)     Initial Impression / Assessment and Plan / ED Course  I have reviewed the triage vital signs and the nursing notes.  Pertinent labs & imaging results that were available during my care of the patient were reviewed by me and considered in my medical decision making (see chart for details).        [name] is a 28 y.o. female with a past medical history significant for type 1 diabetes, prior gastroparesis, GERD, hypertension, and recent admission for dehydration/gastroparesis who presents for hypotension, hyperglycemia, and concern for dehydration.  Patient reports that she recently admitted for nausea, vomiting, abdominal discomfort, and dehydration.  She reports that after that admission,  patient actually had fluid overload and had peripheral edema.  Patient was started on Lasix which he is been taking.  She reports that over the last week she has had significant diarrhea and loose stools.  She says that for the last 2 days she is been having nausea and vomiting and has not been able to keep any food or fluids down.  She went to a PCP visit today and was found to be hyperglycemic in the 400s and hypotensive in the 28J systolic.  Patient presents for evaluation.  Patient feels that her symptoms feel similar to prior DKA with dehydration.  She reports she chronically has abdominal discomfort and it feels like the burning cramping pain when she has gastroparesis.  She denies any urinary symptoms.  She denies any fevers or chills.  Denies any chest pain, shortness of breath, or cough.  No palpitations.  On exam, patient was hypotensive with pressure in the 70s.  She will be started on fluids.  Her legs are edematous bilaterally however her lungs did not sound wet with rales.  Patient had dry mouth and I am more concerned about dehydration given the new diuretic use, nausea, vomiting, and diarrhea.  Patient reports that she has lost 19 today compared to recent admission.  Abdomen had minimal tenderness.  Clinically I am concerned patient is extremely dehydrated and may be going into DKA again.  I suspect is accommodation of the diuretics plus her negative fluid balance with nausea, vomiting, diarrhea.  Patient will have  screening labs and will be given fluids.  Patient reports Phenergan IV helps her with nausea.  This will be ordered.  Hold on pain medication due to low blood pressure initially.  Anticipate reassessment after work-up and patient may require admission for hypotension as she is still unable to tolerate eating and drinking.  3:30 PM Patient was reassessed and she is still having nausea, vomiting, and discomfort.  Labs show that she is not in DKA however she still appears  dehydrated with dry mucous membranes and some tachycardia.  Patient will be given more fluids as well as more nausea medicine and pain medication.  Blood pressure is improved however patient still not tolerating p.o.  Plan of care will be to reassess patient after next liter fluid and see if she is able to tolerate p.o.  If she is able to eat and drink and her blood pressure remained stable without hypotension, she may be eligible to go home to treat her continued gastroparesis with nausea and vomiting.  We will also trend her lactic acid to make sure it is downtrending as it was elevated on arrival.  If she is hypotensive again or shows that she cannot maintain hydration in the setting of her nausea, vomiting, and diarrhea, patient may need readmission for rehydration.  Care transferred to Dr. Laverta Baltimore while awaiting for reassessment and rehydration.   Final Clinical Impressions(s) / ED Diagnoses   Final diagnoses:  Hypotension, unspecified hypotension type  Nausea vomiting and diarrhea  Weight loss     Clinical Impression: 1. Hypotension, unspecified hypotension type   2. Nausea vomiting and diarrhea   3. Weight loss     Disposition: Care transferred to Dr. Laverta Baltimore awaiting reassessment after rehydration and trending of her lactic acid which is elevated.  If patient is unable to maintain hydration, she will need admission given her recent hypotension.  This note was prepared with assistance of Systems analyst. Occasional wrong-word or sound-a-like substitutions may have occurred due to the inherent limitations of voice recognition software.      Chelsei Mcchesney, Gwenyth Allegra, MD 06/03/18 [phone]

## 2018-06-03 NOTE — ED Triage Notes (Signed)
Pt states been sick for last day vomiting. Hx gastroperesis. BP in 70s and sugars in 400s Type 1

## 2018-06-03 NOTE — ED Notes (Signed)
Pt now vomiting after administration of pain med.

## 2018-06-03 NOTE — ED Notes (Signed)
Lab called for results again.

## 2018-06-03 NOTE — ED Notes (Signed)
Pt verbalized understanding of discharge instructions and denies any further questions at this time.   

## 2018-06-03 NOTE — ED Notes (Signed)
Called lab to check on CMP. They are not sure why not resulted and will call back

## 2018-06-04 ENCOUNTER — Inpatient Hospital Stay (HOSPITAL_COMMUNITY)
Admission: EM | Admit: 2018-06-04 | Discharge: 2018-06-10 | DRG: 074 | Disposition: A | Discharge status: Home / Self Care | Payer: 59 | Attending: Internal Medicine | Admitting: Internal Medicine

## 2018-06-04 ENCOUNTER — Emergency Department (HOSPITAL_COMMUNITY): Payer: 59

## 2018-06-04 ENCOUNTER — Encounter (HOSPITAL_COMMUNITY): Payer: Self-pay | Admitting: Emergency Medicine

## 2018-06-04 ENCOUNTER — Other Ambulatory Visit: Payer: Self-pay

## 2018-06-04 DIAGNOSIS — E876 Hypokalemia: Secondary | ICD-10-CM | POA: Diagnosis not present

## 2018-06-04 DIAGNOSIS — K219 Gastro-esophageal reflux disease without esophagitis: Secondary | ICD-10-CM | POA: Diagnosis present

## 2018-06-04 DIAGNOSIS — M797 Fibromyalgia: Secondary | ICD-10-CM | POA: Diagnosis present

## 2018-06-04 DIAGNOSIS — D649 Anemia, unspecified: Secondary | ICD-10-CM | POA: Diagnosis not present

## 2018-06-04 DIAGNOSIS — R1115 Cyclical vomiting syndrome unrelated to migraine: Secondary | ICD-10-CM | POA: Diagnosis not present

## 2018-06-04 DIAGNOSIS — E1159 Type 2 diabetes mellitus with other circulatory complications: Secondary | ICD-10-CM | POA: Diagnosis present

## 2018-06-04 DIAGNOSIS — Z833 Family history of diabetes mellitus: Secondary | ICD-10-CM

## 2018-06-04 DIAGNOSIS — Z800 Family history of malignant neoplasm of digestive organs: Secondary | ICD-10-CM

## 2018-06-04 DIAGNOSIS — K760 Fatty (change of) liver, not elsewhere classified: Secondary | ICD-10-CM | POA: Diagnosis not present

## 2018-06-04 DIAGNOSIS — Z681 Body mass index (BMI) 19 or less, adult: Secondary | ICD-10-CM | POA: Diagnosis not present

## 2018-06-04 DIAGNOSIS — R112 Nausea with vomiting, unspecified: Secondary | ICD-10-CM | POA: Diagnosis not present

## 2018-06-04 DIAGNOSIS — F419 Anxiety disorder, unspecified: Secondary | ICD-10-CM | POA: Diagnosis present

## 2018-06-04 DIAGNOSIS — E10649 Type 1 diabetes mellitus with hypoglycemia without coma: Secondary | ICD-10-CM | POA: Diagnosis not present

## 2018-06-04 DIAGNOSIS — F12988 Cannabis use, unspecified with other cannabis-induced disorder: Secondary | ICD-10-CM | POA: Diagnosis not present

## 2018-06-04 DIAGNOSIS — I10 Essential (primary) hypertension: Secondary | ICD-10-CM

## 2018-06-04 DIAGNOSIS — G8929 Other chronic pain: Secondary | ICD-10-CM

## 2018-06-04 DIAGNOSIS — K3184 Gastroparesis: Secondary | ICD-10-CM | POA: Diagnosis present

## 2018-06-04 DIAGNOSIS — I4581 Long QT syndrome: Secondary | ICD-10-CM | POA: Diagnosis present

## 2018-06-04 DIAGNOSIS — E1059 Type 1 diabetes mellitus with other circulatory complications: Secondary | ICD-10-CM | POA: Diagnosis not present

## 2018-06-04 DIAGNOSIS — E1069 Type 1 diabetes mellitus with other specified complication: Secondary | ICD-10-CM

## 2018-06-04 DIAGNOSIS — E46 Unspecified protein-calorie malnutrition: Secondary | ICD-10-CM | POA: Diagnosis not present

## 2018-06-04 DIAGNOSIS — R1010 Upper abdominal pain, unspecified: Secondary | ICD-10-CM | POA: Diagnosis not present

## 2018-06-04 DIAGNOSIS — I959 Hypotension, unspecified: Secondary | ICD-10-CM | POA: Diagnosis not present

## 2018-06-04 DIAGNOSIS — Z9049 Acquired absence of other specified parts of digestive tract: Secondary | ICD-10-CM | POA: Diagnosis not present

## 2018-06-04 DIAGNOSIS — R1084 Generalized abdominal pain: Secondary | ICD-10-CM | POA: Diagnosis not present

## 2018-06-04 DIAGNOSIS — Z79899 Other long term (current) drug therapy: Secondary | ICD-10-CM

## 2018-06-04 DIAGNOSIS — R1110 Vomiting, unspecified: Secondary | ICD-10-CM | POA: Diagnosis present

## 2018-06-04 DIAGNOSIS — Z8249 Family history of ischemic heart disease and other diseases of the circulatory system: Secondary | ICD-10-CM

## 2018-06-04 DIAGNOSIS — R000 Tachycardia, unspecified: Secondary | ICD-10-CM | POA: Diagnosis present

## 2018-06-04 DIAGNOSIS — E1043 Type 1 diabetes mellitus with diabetic autonomic (poly)neuropathy: Principal | ICD-10-CM | POA: Diagnosis present

## 2018-06-04 DIAGNOSIS — N179 Acute kidney failure, unspecified: Secondary | ICD-10-CM | POA: Diagnosis not present

## 2018-06-04 DIAGNOSIS — R197 Diarrhea, unspecified: Secondary | ICD-10-CM | POA: Diagnosis not present

## 2018-06-04 DIAGNOSIS — K529 Noninfective gastroenteritis and colitis, unspecified: Secondary | ICD-10-CM | POA: Diagnosis present

## 2018-06-04 DIAGNOSIS — E1065 Type 1 diabetes mellitus with hyperglycemia: Secondary | ICD-10-CM | POA: Diagnosis present

## 2018-06-04 DIAGNOSIS — E860 Dehydration: Secondary | ICD-10-CM

## 2018-06-04 DIAGNOSIS — Z794 Long term (current) use of insulin: Secondary | ICD-10-CM

## 2018-06-04 DIAGNOSIS — R1030 Lower abdominal pain, unspecified: Secondary | ICD-10-CM | POA: Diagnosis not present

## 2018-06-04 DIAGNOSIS — I152 Hypertension secondary to endocrine disorders: Secondary | ICD-10-CM | POA: Diagnosis present

## 2018-06-04 DIAGNOSIS — K210 Gastro-esophageal reflux disease with esophagitis: Secondary | ICD-10-CM | POA: Diagnosis not present

## 2018-06-04 DIAGNOSIS — E109 Type 1 diabetes mellitus without complications: Secondary | ICD-10-CM | POA: Diagnosis present

## 2018-06-04 DIAGNOSIS — R109 Unspecified abdominal pain: Secondary | ICD-10-CM

## 2018-06-04 LAB — COMPREHENSIVE METABOLIC PANEL
ALT: 17 U/L (ref 0–44)
AST: 17 U/L (ref 15–41)
Albumin: 3 g/dL — ABNORMAL LOW (ref 3.5–5.0)
Alkaline Phosphatase: 85 U/L (ref 38–126)
Anion gap: 12 (ref 5–15)
BUN: 15 mg/dL (ref 6–20)
CO2: 27 mmol/L (ref 22–32)
Calcium: 8.8 mg/dL — ABNORMAL LOW (ref 8.9–10.3)
Chloride: 98 mmol/L (ref 98–111)
Creatinine, Ser: 0.68 mg/dL (ref 0.44–1.00)
GFR calc Af Amer: >60 mL/min (ref >60)
GFR calc non Af Amer: >60 mL/min (ref >60)
Glucose, Bld: 340 mg/dL — ABNORMAL HIGH (ref 70–99)
Potassium: 4.2 mmol/L (ref 3.5–5.1)
Sodium: 137 mmol/L (ref 135–145)
Total Bilirubin: 0.6 mg/dL (ref 0.3–1.2)
Total Protein: 6.9 g/dL (ref 6.5–8.1)

## 2018-06-04 LAB — CBC WITH DIFFERENTIAL/PLATELET
Abs Immature Granulocytes: 0.01 10*3/uL (ref 0.00–0.07)
Basophils Absolute: 0 10*3/uL (ref 0.0–0.1)
Basophils Relative: 1 %
Eosinophils Absolute: 0.1 10*3/uL (ref 0.0–0.5)
Eosinophils Relative: 2 %
HCT: 32.8 % — ABNORMAL LOW (ref 36.0–46.0)
Hemoglobin: 10.1 g/dL — ABNORMAL LOW (ref 12.0–15.0)
Immature Granulocytes: 0 %
Lymphocytes Relative: 46 %
Lymphs Abs: 1.6 10*3/uL (ref 0.7–4.0)
MCH: 27.4 pg (ref 26.0–34.0)
MCHC: 30.8 g/dL (ref 30.0–36.0)
MCV: 89.1 fL (ref 80.0–100.0)
Monocytes Absolute: 0.3 10*3/uL (ref 0.1–1.0)
Monocytes Relative: 9 %
Neutro Abs: 1.5 10*3/uL — ABNORMAL LOW (ref 1.7–7.7)
Neutrophils Relative %: 42 %
Platelets: 279 10*3/uL (ref 150–400)
RBC: 3.68 MIL/uL — ABNORMAL LOW (ref 3.87–5.11)
RDW: 13.3 % (ref 11.5–15.5)
WBC: 3.5 10*3/uL — ABNORMAL LOW (ref 4.0–10.5)
nRBC: 0 % (ref 0.0–0.2)

## 2018-06-04 LAB — URINALYSIS, ROUTINE W REFLEX MICROSCOPIC
Bacteria, UA: NONE SEEN
Bilirubin Urine: NEGATIVE
Glucose, UA: >=500 mg/dL — AB
Ketones, ur: 20 mg/dL — AB
Leukocytes,Ua: NEGATIVE
Nitrite: NEGATIVE
Protein, ur: 100 mg/dL — AB
Specific Gravity, Urine: 1.02 (ref 1.005–1.030)
pH: 7 (ref 5.0–8.0)

## 2018-06-04 LAB — I-STAT BETA HCG BLOOD, ED (MC, WL, AP ONLY): I-stat hCG, quantitative: <5 m[IU]/mL (ref <5)

## 2018-06-04 LAB — URINE CULTURE

## 2018-06-04 LAB — GLUCOSE, CAPILLARY
Glucose-Capillary: 271 mg/dL — ABNORMAL HIGH (ref 70–99)
Glucose-Capillary: 183 mg/dL — ABNORMAL HIGH (ref 70–99)

## 2018-06-04 LAB — LIPASE, BLOOD: Lipase: 19 U/L (ref 11–51)

## 2018-06-04 IMAGING — DX DG ABDOMEN 2V
2 series · 2 of 2 positions shown · non-contrast
Comparison: None.

CLINICAL DATA: Nausea vomiting and diarrhea.

EXAM:
ABDOMEN - 2 VIEW

[w abdomen upright]
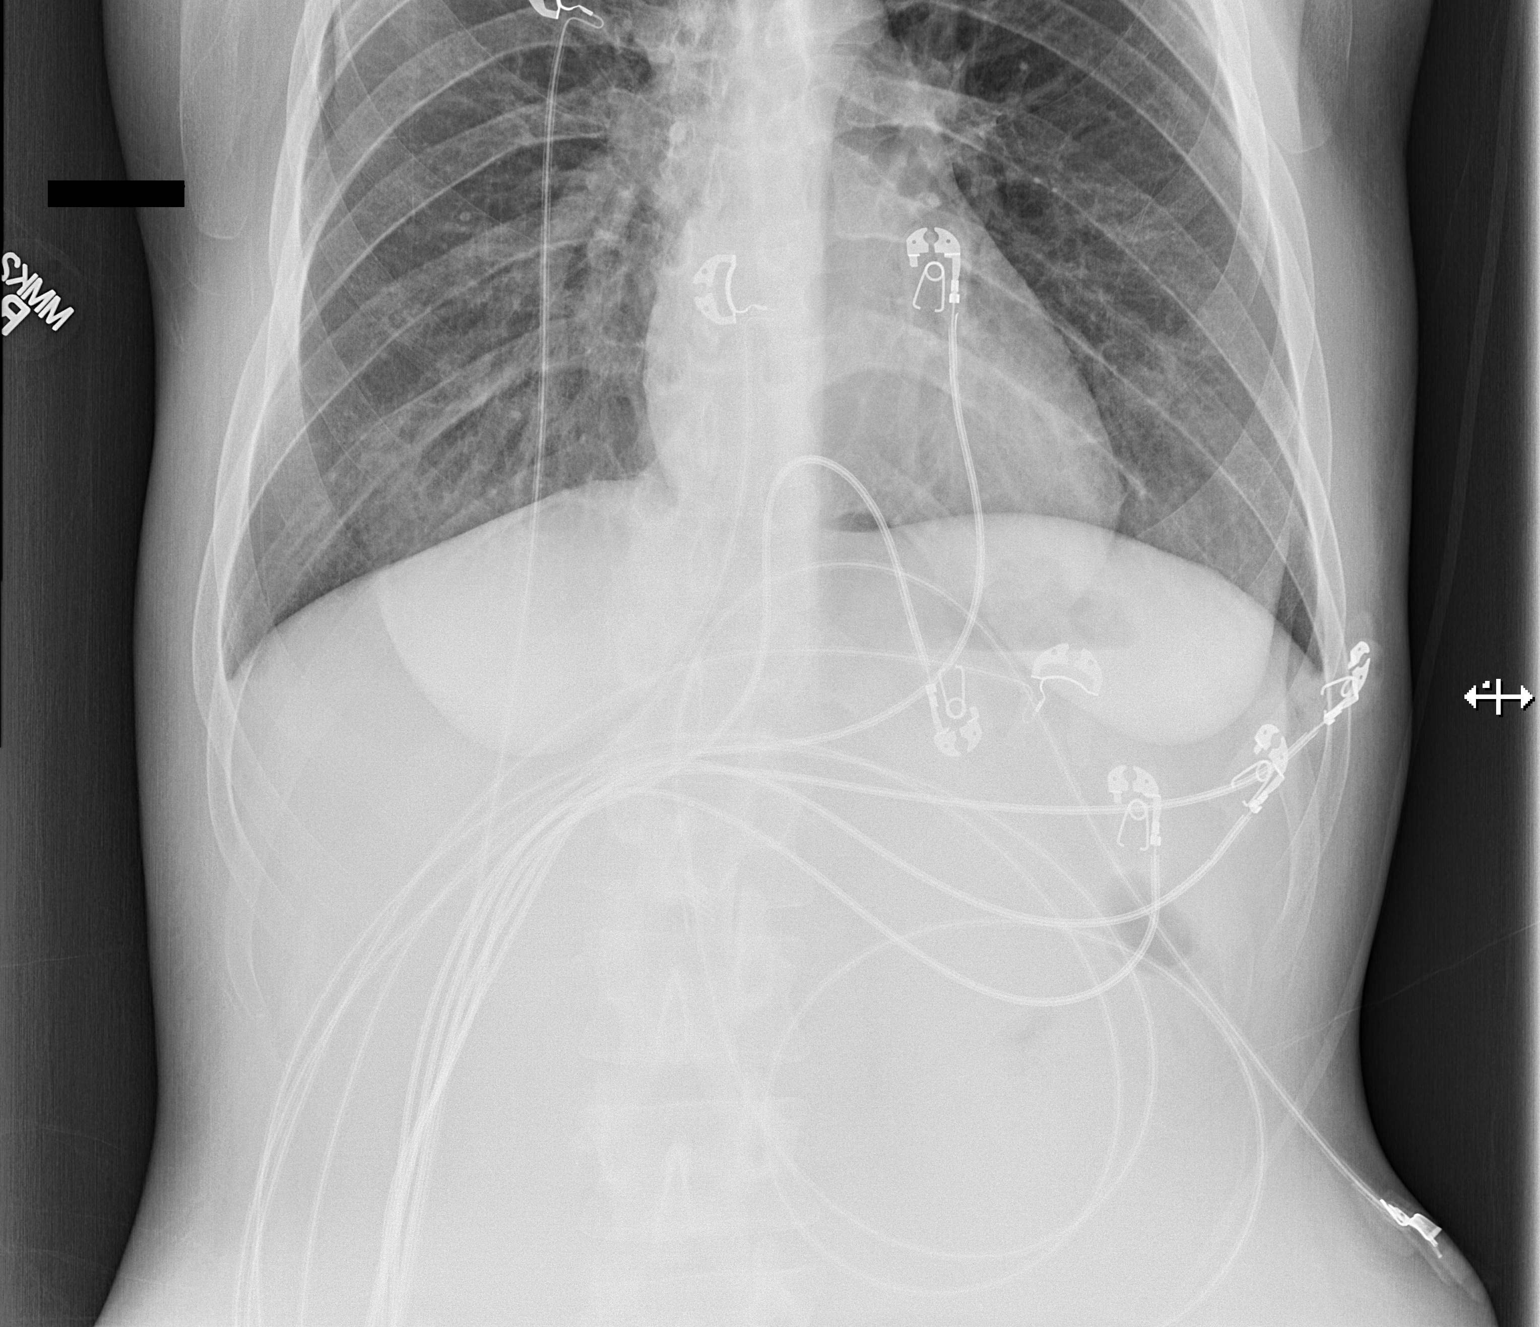

[t abdomen supine]
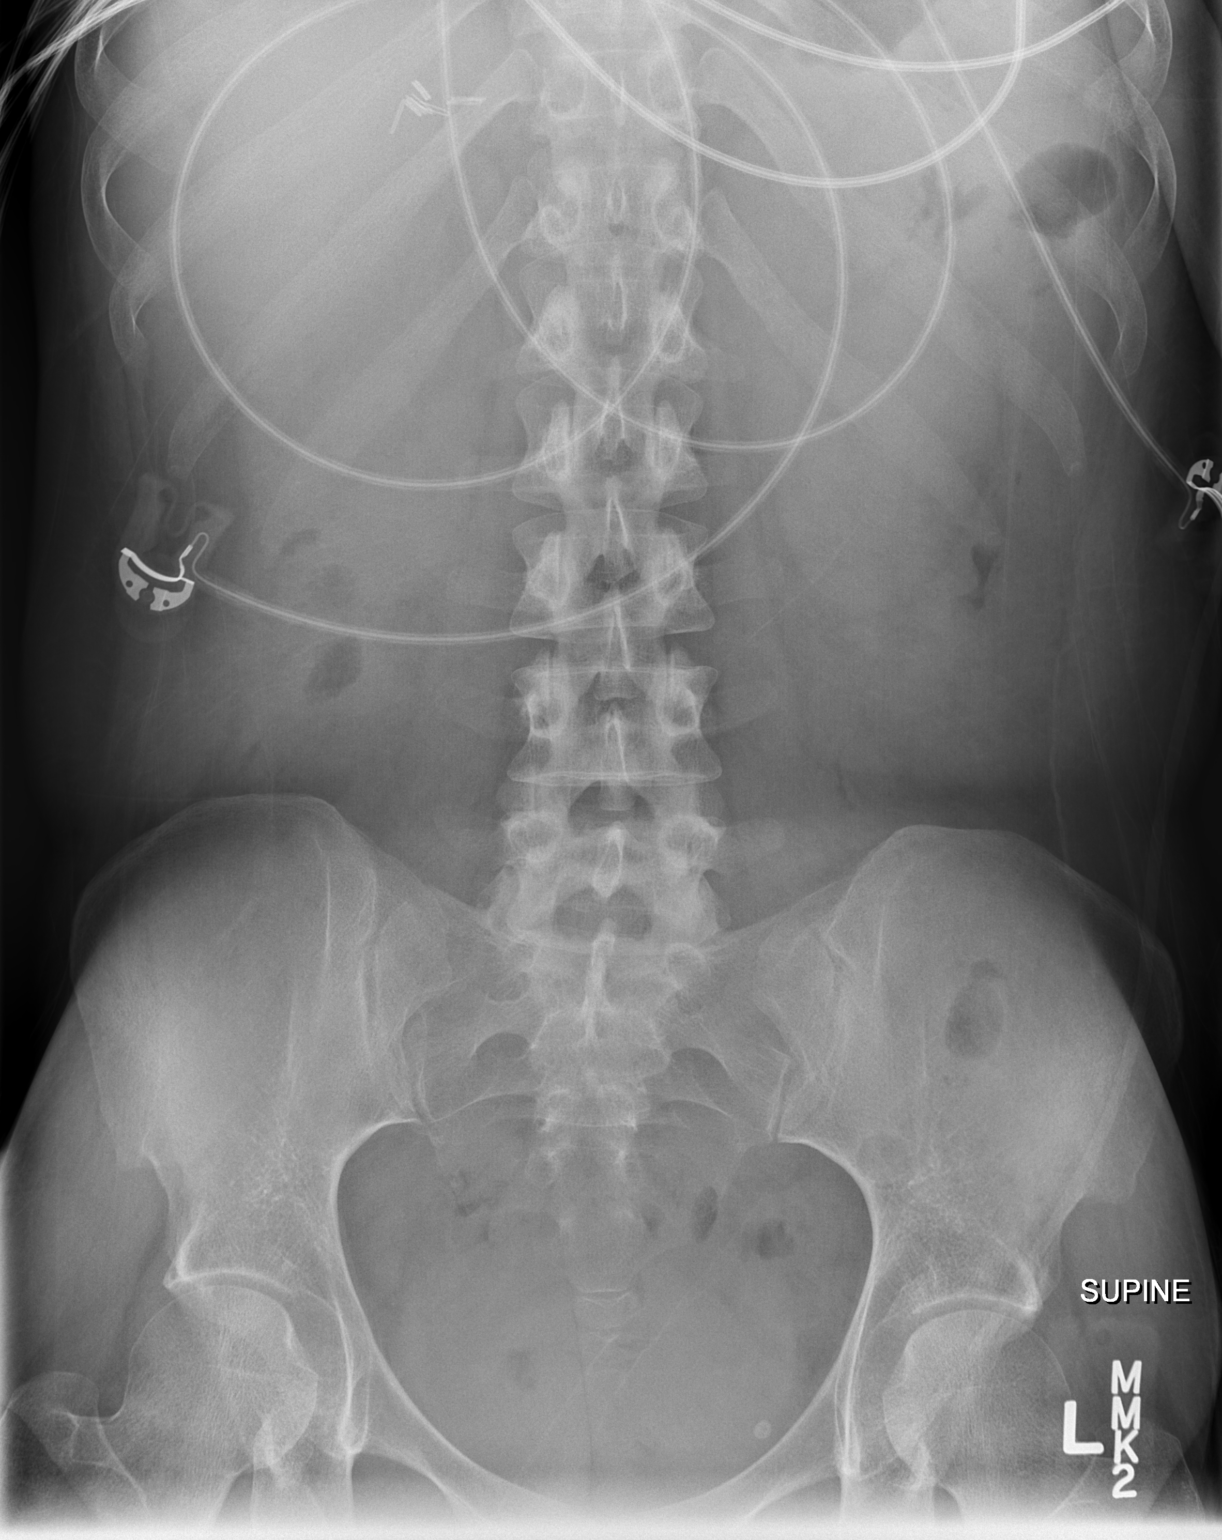

[2 of 2 positions shown; findings below may reference images not displayed]

FINDINGS: The bowel gas pattern is normal. There is no evidence of free air.
No radio-opaque calculi or other significant radiographic
abnormality is seen.
IMPRESSION: Negative.

## 2018-06-04 MED ORDER — FENTANYL CITRATE (PF) 100 MCG/2ML IJ SOLN
50.0000 ug | Freq: Once | INTRAMUSCULAR | Status: AC
  Administered 2018-06-04: 50 ug via INTRAVENOUS
  Filled 2018-06-04: qty 2

## 2018-06-04 MED ORDER — PROMETHAZINE HCL 25 MG/ML IJ SOLN
12.5000 mg | Freq: Four times a day (QID) | INTRAMUSCULAR | Status: DC | PRN
  Administered 2018-06-04: 12.5 mg via INTRAVENOUS
  Filled 2018-06-04: qty 1

## 2018-06-04 MED ORDER — MIRTAZAPINE 15 MG PO TABS
30.0000 mg | ORAL_TABLET | Freq: Every day | ORAL | Status: DC
  Administered 2018-06-04 – 2018-06-09 (×4): 30 mg via ORAL
  Filled 2018-06-04 (×5): qty 2

## 2018-06-04 MED ORDER — LABETALOL HCL 5 MG/ML IV SOLN
10.0000 mg | INTRAVENOUS | Status: DC | PRN
  Administered 2018-06-04: 10 mg via INTRAVENOUS
  Filled 2018-06-04 (×2): qty 4

## 2018-06-04 MED ORDER — INSULIN DETEMIR 100 UNIT/ML ~~LOC~~ SOLN
10.0000 [IU] | Freq: Every day | SUBCUTANEOUS | Status: DC
  Administered 2018-06-04 – 2018-06-05 (×2): 10 [IU] via SUBCUTANEOUS
  Filled 2018-06-04 (×2): qty 0.1

## 2018-06-04 MED ORDER — PROMETHAZINE HCL 25 MG/ML IJ SOLN
25.0000 mg | Freq: Once | INTRAMUSCULAR | Status: AC
  Administered 2018-06-04: 25 mg via INTRAVENOUS
  Filled 2018-06-04: qty 1

## 2018-06-04 MED ORDER — ONDANSETRON HCL 4 MG PO TABS: 4.0000 mg | ORAL_TABLET | Freq: Four times a day (QID) | ORAL | Status: DC | PRN

## 2018-06-04 MED ORDER — ONDANSETRON HCL 4 MG/2ML IJ SOLN
4.0000 mg | Freq: Once | INTRAMUSCULAR | Status: AC
  Administered 2018-06-04: 4 mg via INTRAVENOUS
  Filled 2018-06-04: qty 2

## 2018-06-04 MED ORDER — METOCLOPRAMIDE HCL 5 MG/ML IJ SOLN
10.0000 mg | Freq: Once | INTRAMUSCULAR | Status: AC
  Administered 2018-06-04: 10 mg via INTRAVENOUS
  Filled 2018-06-04: qty 2

## 2018-06-04 MED ORDER — ENOXAPARIN SODIUM 40 MG/0.4ML ~~LOC~~ SOLN
40.0000 mg | SUBCUTANEOUS | Status: DC
  Administered 2018-06-04 – 2018-06-09 (×6): 40 mg via SUBCUTANEOUS
  Filled 2018-06-04 (×6): qty 0.4

## 2018-06-04 MED ORDER — SODIUM CHLORIDE 0.9 % IV BOLUS
1000.0000 mL | Freq: Once | INTRAVENOUS | Status: AC
  Administered 2018-06-04: 1000 mL via INTRAVENOUS

## 2018-06-04 MED ORDER — MORPHINE SULFATE (PF) 2 MG/ML IV SOLN
2.0000 mg | INTRAVENOUS | Status: DC | PRN
  Administered 2018-06-04 – 2018-06-05 (×5): 2 mg via INTRAVENOUS
  Filled 2018-06-04 (×5): qty 1

## 2018-06-04 MED ORDER — SODIUM CHLORIDE 0.9 % IV SOLN
INTRAVENOUS | Status: DC
  Administered 2018-06-04 – 2018-06-05 (×2): via INTRAVENOUS

## 2018-06-04 MED ORDER — LORAZEPAM 0.5 MG PO TABS
0.5000 mg | ORAL_TABLET | Freq: Two times a day (BID) | ORAL | Status: DC | PRN
  Administered 2018-06-04 – 2018-06-07 (×6): 0.5 mg via ORAL
  Filled 2018-06-04 (×6): qty 1

## 2018-06-04 MED ORDER — LOSARTAN POTASSIUM 25 MG PO TABS
25.0000 mg | ORAL_TABLET | Freq: Every day | ORAL | Status: DC
  Administered 2018-06-05 – 2018-06-08 (×4): 25 mg via ORAL
  Filled 2018-06-04 (×5): qty 1

## 2018-06-04 MED ORDER — ONDANSETRON HCL 4 MG/2ML IJ SOLN
4.0000 mg | Freq: Four times a day (QID) | INTRAMUSCULAR | Status: DC | PRN
  Administered 2018-06-04 – 2018-06-05 (×2): 4 mg via INTRAVENOUS
  Filled 2018-06-04 (×3): qty 2

## 2018-06-04 MED ORDER — HYOSCYAMINE SULFATE 0.125 MG SL SUBL
0.1250 mg | SUBLINGUAL_TABLET | Freq: Four times a day (QID) | SUBLINGUAL | Status: DC | PRN
  Administered 2018-06-04: 0.125 mg via SUBLINGUAL
  Filled 2018-06-04 (×3): qty 1

## 2018-06-04 MED ORDER — INSULIN ASPART 100 UNIT/ML ~~LOC~~ SOLN
0.0000 [IU] | SUBCUTANEOUS | Status: DC
  Administered 2018-06-04: 5 [IU] via SUBCUTANEOUS
  Administered 2018-06-04 – 2018-06-05 (×3): 2 [IU] via SUBCUTANEOUS
  Administered 2018-06-05: 3 [IU] via SUBCUTANEOUS
  Administered 2018-06-05: 1 [IU] via SUBCUTANEOUS
  Administered 2018-06-05 (×2): 3 [IU] via SUBCUTANEOUS
  Administered 2018-06-06 – 2018-06-07 (×4): 1 [IU] via SUBCUTANEOUS
  Administered 2018-06-07 (×2): 2 [IU] via SUBCUTANEOUS
  Administered 2018-06-07: 1 [IU] via SUBCUTANEOUS
  Administered 2018-06-08 (×2): 2 [IU] via SUBCUTANEOUS
  Administered 2018-06-08: 1 [IU] via SUBCUTANEOUS
  Administered 2018-06-08 – 2018-06-09 (×2): 2 [IU] via SUBCUTANEOUS
  Administered 2018-06-09: 1 [IU] via SUBCUTANEOUS
  Administered 2018-06-09: 3 [IU] via SUBCUTANEOUS
  Administered 2018-06-09: 2 [IU] via SUBCUTANEOUS
  Administered 2018-06-09 (×2): 3 [IU] via SUBCUTANEOUS
  Administered 2018-06-10 (×2): 2 [IU] via SUBCUTANEOUS

## 2018-06-04 MED ORDER — PANTOPRAZOLE SODIUM 40 MG PO TBEC
40.0000 mg | DELAYED_RELEASE_TABLET | Freq: Every day | ORAL | Status: DC
  Administered 2018-06-04: 40 mg via ORAL
  Filled 2018-06-04: qty 1

## 2018-06-04 NOTE — ED Provider Notes (Signed)
[facility] EMERGENCY DEPARTMENT Provider Note   CSN: [id number] Arrival date & time: 06/04/18  1133    History   Chief Complaint Chief Complaint  Patient presents with  . Emesis  . Nausea    HPI [name] is a 28 y.o. female who presents with nausea and vomiting. PMH significant for type 1 DM, gastroparesis, fibromyalgia, chronic abdominal pain, chronic diarrhea. She states that she was here yesterday. She felt better but then went home and felt bad again. She hasn't been able to keep anything down. She has oral Phenergan and Zofran at home but this hasn't helped. She has chronic abdominal pain which is worse that usual. She denies fevers, dysuria. She has had some abdominal bloating but has been able to pass gas and has been having diarrhea which is also chronic. Past surgical hx significant for cholecystectomy, bowel obstruction. She is planning on a gastric pacemaker but this is at the end of the month. Blood sugars have been elevated and difficult to control. She is taking her insulin.     HPI  Past Medical History:  Diagnosis Date  . Allergy   . Anemia   . Anxiety   . Blood transfusion without reported diagnosis    Dec 2018  . Cataract    right eye  . Depression   . Diabetes type 1, uncontrolled (HCC) 11/14/2011   Since age 49  . Fibromyalgia   . Gastroparesis   . GERD (gastroesophageal reflux disease)   . Hypertension   . Infection    UTI April 2016    Patient Active Problem List   Diagnosis Date Noted  . Anxiety 05/31/2018  . Peripheral edema 05/31/2018  . Normocytic anemia 07/10/2017  . GERD (gastroesophageal reflux disease) 06/23/2017  . 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 Diabetes mellitus type 1 (HCC) 04/28/2013  . Protein-calorie malnutrition, severe (HCC) 04/28/2013  . Hypertension associated with diabetes (HCC) 11/14/2011  . Gastroparesis 11/14/2011    Past Surgical History:    Procedure Laterality Date  . ANKLE SURGERY    . CHOLECYSTECTOMY  11/15/2011   Procedure: LAPAROSCOPIC CHOLECYSTECTOMY WITH INTRAOPERATIVE CHOLANGIOGRAM;  Surgeon: [name], MD;  Location: [facility];  Service: General;  Laterality: N/A;  . COLONOSCOPY    . COLONOSCOPY WITH PROPOFOL N/A 06/27/2017   Procedure: COLONOSCOPY WITH PROPOFOL;  Surgeon: Rachael Fee, MD;  Location: [facility];  Service: Endoscopy;  Laterality: N/A;  . ESOPHAGOGASTRODUODENOSCOPY  12/03/2011   Procedure: ESOPHAGOGASTRODUODENOSCOPY (EGD);  Surgeon: Theda Belfast, MD;  Location: [facility];  Service: Endoscopy;  Laterality: N/A;  . FLEXIBLE SIGMOIDOSCOPY N/A 03/10/2017   Procedure: FLEXIBLE SIGMOIDOSCOPY;  Surgeon: Jeani Hawking, MD;  [facility];  Service: Endoscopy;  Laterality: N/A;  . INCISION AND DRAINAGE PERIRECTAL ABSCESS N/A 03/01/2017   Procedure: IRRIGATION AND DEBRIDEMENT PERIRECTAL ABSCESS;  Surgeon: Ovidio Kin, MD;  [facility];  Service: General;  Laterality: N/A;  . IRRIGATION AND DEBRIDEMENT BUTTOCKS N/A 03/23/2017   Procedure: IRRIGATION AND DEBRIDEMENT BUTTOCKS, SETON PLACEMENT;  Surgeon: Romie Levee, MD;  [facility];  Service: General;  Laterality: N/A;  . LAPAROSCOPY  11/23/2011   Procedure: LAPAROSCOPY DIAGNOSTIC;  Surgeon: Mariella Saa, MD;  Location: [facility];  Service: General;  Laterality: N/A;  . SIGMOIDOSCOPY    . UPPER GASTROINTESTINAL ENDOSCOPY    . WISDOM TOOTH EXTRACTION       OB History    Gravida  1   Para  1   Term      Preterm  1   AB      Living  1     SAB      TAB      Ectopic      Multiple  0   Live Births  1            Home Medications    Prior to Admission medications   Medication Sig Start Date End Date Taking? Authorizing Provider  dicyclomine (BENTYL) 20 MG tablet Take 1 tablet (20 mg total) by mouth 3 (three) times daily as needed for spasms (abdominal cramping). 06/03/18   Long, Arlyss Repress, MD  feeding  supplement, ENSURE ENLIVE, (ENSURE ENLIVE) LIQD Take 237 mLs by mouth 2 (two) times daily between meals. 05/18/18   Alwyn Ren, MD  furosemide (LASIX) 40 MG tablet Take 1 tablet (40 mg total) by mouth daily for 30 days. Patient taking differently: Take 40 mg by mouth at bedtime.  05/27/18 06/26/18  Unk Lightning, PA  hyoscyamine (LEVSIN SL) 0.125 MG SL tablet Place 1 tablet (0.125 mg total) under the tongue every 6 (six) hours as needed for cramping (abdominal pain). 02/20/18   Burnadette Pop, MD  insulin lispro (HUMALOG) 100 UNIT/ML injection Inject 0-0.09 mLs (0-9 Units total) into the skin 3 (three) times daily with meals. 02/20/18   Burnadette Pop, MD  LANTUS 100 UNIT/ML injection Inject 0.12 mLs (12 Units total) into the skin daily. Patient using Vials 06/03/18   Ardith Dark, MD  LORazepam (ATIVAN) 0.5 MG tablet Take 1 tablet (0.5 mg total) by mouth 2 (two) times daily as needed for anxiety. 05/31/18   Ardith Dark, MD  losartan (COZAAR) 25 MG tablet Take 25 mg by mouth daily.    [provider]  metoCLOPramide (REGLAN) 10 MG tablet Take 1 tablet (10 mg total) by mouth every 6 (six) hours as needed for nausea or vomiting. 02/20/18   Burnadette Pop, MD  mirtazapine (REMERON) 30 MG tablet Take 1 tablet (30 mg total) by mouth at bedtime. 05/31/18   Ardith Dark, MD  ondansetron (ZOFRAN) 4 MG tablet Take 1 tablet (4 mg total) by mouth every 8 (eight) hours as needed for nausea or vomiting (If necessary, dont combine with Reglan.). 03/25/18   Amin, Loura Halt, MD  pantoprazole (PROTONIX) 40 MG tablet TAKE 1 TABLET BY MOUTH  DAILY Patient taking differently: Take 40 mg by mouth at bedtime.  05/04/18   Armbruster, Willaim Rayas, MD  promethazine (PHENERGAN) 12.5 MG tablet Take 12.5 mg by mouth 2 (two) times daily as needed for nausea/vomiting. 03/11/18   [provider]    Family History Family History  Problem Relation Age of Onset  . Diabetes Mother   .  Hypertension Father   . Colon cancer Paternal Grandmother        pt thinks PGM was dx in her 29's  . Diabetes Paternal Grandmother   . Diabetes Maternal Grandmother   . Diabetes Maternal Grandfather   . Diabetes Paternal Grandfather   . Diabetes Other   . Esophageal cancer Neg Hx   . Liver cancer Neg Hx   . Pancreatic cancer Neg Hx   . Stomach cancer Neg Hx   . Rectal cancer Neg Hx     Social History Social History   Tobacco Use  . Smoking status: Never Smoker  . Smokeless tobacco: Never Used  Substance Use Topics  . Alcohol use: No  Alcohol/week: 0.0 standard drinks    Frequency: Never  . Drug use: No     Allergies   Other and Lactose intolerance (gi)   Review of Systems Review of Systems  Constitutional: Negative for fever.  Respiratory: Negative for shortness of breath.   Cardiovascular: Negative for chest pain.  Gastrointestinal: Positive for abdominal pain, diarrhea, nausea and vomiting.  Genitourinary: Negative for dysuria.  Allergic/Immunologic: Positive for immunocompromised state.  All other systems reviewed and are negative.    Physical Exam Updated Vital Signs BP (!) 171/110 (BP Location: Right Arm)   Pulse (!) 106   Temp 98 F (36.7 C) (Oral)   Resp 19   SpO2 100%   Physical Exam Vitals signs and nursing note reviewed.  Constitutional:      General: She is not in acute distress.    Appearance: She is well-developed. She is ill-appearing (chronically ill).     Comments: Uncomfortable appearing, cooperative  HENT:     Head: Normocephalic and atraumatic.     Comments: Dry mucous membranes Eyes:     General: No scleral icterus.       Right eye: No discharge.        Left eye: No discharge.     Conjunctiva/sclera: Conjunctivae normal.     Pupils: Pupils are equal, round, and reactive to light.  Neck:     Musculoskeletal: Normal range of motion.  Cardiovascular:     Rate and Rhythm: Tachycardia present.  Pulmonary:     Effort: Pulmonary  effort is normal. No respiratory distress.     Breath sounds: Normal breath sounds.  Abdominal:     General: There is no distension.     Palpations: Abdomen is soft.     Tenderness: There is no abdominal tenderness.  Skin:    General: Skin is warm and dry.  Neurological:     Mental Status: She is alert and oriented to person, place, and time.  Psychiatric:        Behavior: Behavior normal.      ED Treatments / Results  Labs (all labs ordered are listed, but only abnormal results are displayed) Labs Reviewed  CBC WITH DIFFERENTIAL/PLATELET - Abnormal; Notable for the following components:      Result Value   WBC 3.5 (*)    RBC 3.68 (*)    Hemoglobin 10.1 (*)    HCT 32.8 (*)    Neutro Abs 1.5 (*)    All other components within normal limits  COMPREHENSIVE METABOLIC PANEL - Abnormal; Notable for the following components:   Glucose, Bld 340 (*)    Calcium 8.8 (*)    Albumin 3.0 (*)    All other components within normal limits  URINALYSIS, ROUTINE W REFLEX MICROSCOPIC - Abnormal; Notable for the following components:   Glucose, UA >=500 (*)    Hgb urine dipstick SMALL (*)    Ketones, ur 20 (*)    Protein, ur 100 (*)    All other components within normal limits  LIPASE, BLOOD  I-STAT BETA HCG BLOOD, ED ([facility], [facility], [facility] ONLY)    EKG EKG Interpretation  Date/Time:  Friday June 04 2018 12:03:47 EST Ventricular Rate:  105 PR Interval:    QRS Duration: 79 QT Interval:  341 QTC Calculation: 451 R Axis:   74 Text Interpretation:  Sinus tachycardia ST elev, probable normal early repol pattern Baseline wander in lead(s) V1 V5 V6 Confirmed by Benjiman Core [id number]) on 06/04/2018 12:08:12 PM   Radiology No results  found.  Procedures Procedures (including critical care time)  Medications Ordered in ED Medications  promethazine (PHENERGAN) injection 25 mg (25 mg Intravenous Given 06/04/18 1245)  sodium chloride 0.9 % bolus 1,000 mL (1,000 mLs Intravenous New Bag/Given 06/04/18  1244)  ondansetron (ZOFRAN) injection 4 mg (4 mg Intravenous Given 06/04/18 1415)  fentaNYL (SUBLIMAZE) injection 50 mcg (50 mcg Intravenous Given 06/04/18 1415)     Initial Impression / Assessment and Plan / ED Course  I have reviewed the triage vital signs and the nursing notes.  Pertinent labs & imaging results that were available during my care of the patient were reviewed by me and considered in my medical decision making (see chart for details).  28 year old female who presents with recurrent N/V, epigastric pain likely due to gastroparesis. She has had multiple admission for the same. She was seen yesterday and felt better but couldn't not tolerate her symptoms at home. She is mildly tachycardic and hypertensive here. Heart is tachycardic and regular. Lungs are CTA. Abdomden is soft, non-distended and tender in the epigastric area. Mucous membranes are dry. Will repeat labs and start fluids and give IV phenergan. EKG shows QTc is .45.   CBC is remarkable for mild leukopenia and anemia. CMP is remarkable for hyperglycemia (340), low calcium, low albumin. Anion gap is normal. UA shows >500 glucose, small hgb, 20 ketones, 100 protein. No evidence of infection. She is not pregnant. Re-checked pt. She is still feeling unwell, complaining of nausea and pain. Discussed with attending. Clinically difficult situation since most of her symptoms are acute on chronic. We are limited what meds to give here with borderline prolonged QT. With her bouncing back within 24 hours, will consult hospitalist for admission. Spoke with Dr. Jerral Ralph who will admit.  Final Clinical Impressions(s) / ED Diagnoses   Final diagnoses:  Abdominal pain  Intractable vomiting with nausea, unspecified vomiting type  Dehydration  Gastroparesis    ED Discharge Orders    None       Bethel Born, PA-C 06/04/18 1459    Benjiman Core, MD 06/04/18 [phone]

## 2018-06-04 NOTE — ED Notes (Signed)
Pt actively vomiting. C/o abd pain 10/10. PRN pain/nausea orders released for relief.

## 2018-06-04 NOTE — ED Triage Notes (Signed)
Pt arrives to ED from home with complaints of N/V since Tuesday. Pt reports hx of gastroparesis Pt vomited six times today. Vomit was yellow tinged.

## 2018-06-04 NOTE — ED Notes (Signed)
Pt's IV infiltrated during pre-medication administration flush. IV removed and site bandaged. Second 20g IV started in Left AC with good blood return noted, however resistance was met with flushing. No pain verbalized by pt. Morphine and Zofran both given as ordered without complication. IV removed after administration per pt request due to fear of infiltration. IV team order will be placed before pt is moved to inpatient unit.

## 2018-06-04 NOTE — H&P (Signed)
History and Physical    [name] UVO:[id number] DOB: March 23, 1991 DOA: 06/04/2018  PCP: [name], MD  Patient coming from: home   I have personally briefly reviewed patient's old medical records available.   Chief Complaint: Nausea vomiting unable to eat and abdominal pain.  HPI: [name] is a 28 y.o. female with medical history significant of type 1 diabetes, chronic abdominal pain, frequent admissions for gastroparesis related symptoms who presented to the emergency room with another episode of upper abdominal pain, persistent nausea and vomiting for 2 days.  According to the patient, after she was discharged she did well for about 2 weeks only to start symptoms 2 days ago.  Complains of upper abdominal pain mostly in the epigastrium, 9 out of 10, worse with nausea and vomiting, relieved with fentanyl in the ER.  No radiation of the pain.  She has chronic diarrhea.  She continues to have nausea.  Came to ER yesterday, felt somehow better and went home but could not keep any food down so came back to ER today.  Denies any fever or chills.  Denies any chest pain or shortness of breath.  She is followed by GI and has been referred to [place] for pacemaker? ED Course: In the emergency room, she is afebrile.  Patient is on room air.  She is tachycardic.  Blood pressures stable and somehow elevated.  Hemoglobin is consistent with previous findings.  UA negative for infection.  Patient was given multiple doses of nausea medications, fentanyl.  Persistent symptoms.  So advised hospitalization.  Blood sugars are 300.  No anion gap.  This patient is having persistent symptoms of gastroparesis, unable to eat, elevated blood sugars.  She will need inpatient treatment, IV fluids, IV pain medications and insulin adjustment.  Anticipate hospitalization for more than 2 midnights.  Review of Systems: As per HPI otherwise 10 point review of systems negative.    Past Medical History:  Diagnosis  Date  . Allergy   . Anemia   . Anxiety   . Blood transfusion without reported diagnosis    Dec 2018  . Cataract    right eye  . Depression   . Diabetes type 1, uncontrolled ([place]) 11/14/2011   Since age 27  . Fibromyalgia   . Gastroparesis   . GERD (gastroesophageal reflux disease)   . Hypertension   . Infection    UTI April 2016    Past Surgical History:  Procedure Laterality Date  . ANKLE SURGERY    . CHOLECYSTECTOMY  11/15/2011   Procedure: LAPAROSCOPIC CHOLECYSTECTOMY WITH INTRAOPERATIVE CHOLANGIOGRAM;  Surgeon: [name], MD;  Location: [facility];  Service: General;  Laterality: N/A;  . COLONOSCOPY    . COLONOSCOPY WITH PROPOFOL N/A 06/27/2017   Procedure: COLONOSCOPY WITH PROPOFOL;  Surgeon: Milus Banister, MD;  [facility];  Service: Endoscopy;  Laterality: N/A;  . ESOPHAGOGASTRODUODENOSCOPY  12/03/2011   Procedure: ESOPHAGOGASTRODUODENOSCOPY (EGD);  Surgeon: Beryle Beams, MD;  Location: [facility];  Service: Endoscopy;  Laterality: N/A;  . FLEXIBLE SIGMOIDOSCOPY N/A 03/10/2017   Procedure: FLEXIBLE SIGMOIDOSCOPY;  Surgeon: Carol Ada, MD;  [facility];  Service: Endoscopy;  Laterality: N/A;  . INCISION AND DRAINAGE PERIRECTAL ABSCESS N/A 03/01/2017   Procedure: IRRIGATION AND DEBRIDEMENT PERIRECTAL ABSCESS;  Surgeon: Alphonsa Overall, MD;  [facility]: [facility];  Service: General;  Laterality: N/A;  . IRRIGATION AND DEBRIDEMENT BUTTOCKS N/A 03/23/2017   Procedure: IRRIGATION AND DEBRIDEMENT BUTTOCKS, SETON PLACEMENT;  Surgeon: Marcello Moores,  Elmo Putt, MD;  Location: [facility];  Service: General;  Laterality: N/A;  . LAPAROSCOPY  11/23/2011   Procedure: LAPAROSCOPY DIAGNOSTIC;  Surgeon: Edward Jolly, MD;  [facility];  Service: General;  Laterality: N/A;  . SIGMOIDOSCOPY    . UPPER GASTROINTESTINAL ENDOSCOPY    . WISDOM TOOTH EXTRACTION       reports that she has never smoked. She has never used smokeless tobacco. She reports that she does not drink  alcohol or use drugs.  Allergies  Allergen Reactions  . Other Anaphylaxis    Reaction to [place] nuts   . Lactose Intolerance (Gi) Diarrhea    Family History  Problem Relation Age of Onset  . Diabetes Mother   . Hypertension Father   . Colon cancer Paternal Grandmother        pt thinks PGM was dx in her 73's  . Diabetes Paternal Grandmother   . Diabetes Maternal Grandmother   . Diabetes Maternal Grandfather   . Diabetes Paternal Grandfather   . Diabetes Other   . Esophageal cancer Neg Hx   . Liver cancer Neg Hx   . Pancreatic cancer Neg Hx   . Stomach cancer Neg Hx   . Rectal cancer Neg Hx      Prior to Admission medications   Medication Sig Start Date End Date Taking? Authorizing Provider  dicyclomine (BENTYL) 20 MG tablet Take 1 tablet (20 mg total) by mouth 3 (three) times daily as needed for spasms (abdominal cramping). 06/03/18   Long, Wonda Olds, MD  feeding supplement, ENSURE ENLIVE, (ENSURE ENLIVE) LIQD Take 237 mLs by mouth 2 (two) times daily between meals. 05/18/18   Georgette Shell, MD  furosemide (LASIX) 40 MG tablet Take 1 tablet (40 mg total) by mouth daily for 30 days. Patient taking differently: Take 40 mg by mouth at bedtime.  05/27/18 06/26/18  Levin Erp, PA  hyoscyamine (LEVSIN SL) 0.125 MG SL tablet Place 1 tablet (0.125 mg total) under the tongue every 6 (six) hours as needed for cramping (abdominal pain). 02/20/18   Shelly Coss, MD  insulin lispro (HUMALOG) 100 UNIT/ML injection Inject 0-0.09 mLs (0-9 Units total) into the skin 3 (three) times daily with meals. 02/20/18   Shelly Coss, MD  LANTUS 100 UNIT/ML injection Inject 0.12 mLs (12 Units total) into the skin daily. Patient using Vials 06/03/18   [name], MD  LORazepam (ATIVAN) 0.5 MG tablet Take 1 tablet (0.5 mg total) by mouth 2 (two) times daily as needed for anxiety. 05/31/18   [name], MD  losartan (COZAAR) 25 MG tablet Take 25 mg by mouth daily.    [provider]  metoCLOPramide (REGLAN) 10 MG tablet Take 1 tablet (10 mg total) by mouth every 6 (six) hours as needed for nausea or vomiting. 02/20/18   Shelly Coss, MD  mirtazapine (REMERON) 30 MG tablet Take 1 tablet (30 mg total) by mouth at bedtime. 05/31/18   [name], MD  ondansetron (ZOFRAN) 4 MG tablet Take 1 tablet (4 mg total) by mouth every 8 (eight) hours as needed for nausea or vomiting (If necessary, dont combine with Reglan.). 03/25/18   Amin, Jeanella Flattery, MD  pantoprazole (PROTONIX) 40 MG tablet TAKE 1 TABLET BY MOUTH  DAILY Patient taking differently: Take 40 mg by mouth at bedtime.  05/04/18   Armbruster, Carlota Raspberry, MD  promethazine (PHENERGAN) 12.5 MG tablet Take 12.5 mg by mouth 2 (two) times daily as needed for nausea/vomiting.  03/11/18   [provider]    Physical Exam: Vitals:   06/04/18 1245 06/04/18 1315 06/04/18 1330 06/04/18 1345  BP:      Pulse: (!) 104 (!) 105 91 91  Resp:      Temp:      TempSrc:      SpO2: 100% 99% 100% 98%    Constitutional: NAD, calm, comfortable Vitals:   06/04/18 1245 06/04/18 1315 06/04/18 1330 06/04/18 1345  BP:      Pulse: (!) 104 (!) 105 91 91  Resp:      Temp:      TempSrc:      SpO2: 100% 99% 100% 98%   Eyes: PERRL, lids and conjunctivae normal Sick looking. ENMT: Mucous membranes are dry . Posterior pharynx clear of any exudate or lesions.Normal dentition.  Neck: normal, supple, no masses, no thyromegaly Respiratory: clear to auscultation bilaterally, no wheezing, no crackles. Normal respiratory effort. No accessory muscle use.  Cardiovascular: Regular rate and rhythm, no murmurs / rubs / gallops. No extremity edema. 2+ pedal pulses. No carotid bruits.  Abdomen: Mild epigastric tenderness on palpation, no masses palpated. No hepatosplenomegaly. Bowel sounds positive.  Musculoskeletal: no clubbing / cyanosis. No joint deformity upper and lower extremities. Good ROM, no contractures. Normal muscle tone.    Skin: no rashes, lesions, ulcers. No induration Neurologic: CN 2-12 grossly intact. Sensation intact, DTR normal. Strength 5/5 in all 4.  Psychiatric: Normal judgment and insight. Alert and oriented x 3.  Anxious.    Labs on Admission: I have personally reviewed following labs and imaging studies  CBC: Recent Labs  Lab 06/03/18 1226 06/03/18 1236 06/04/18 1209  WBC 4.9  --  3.5*  NEUTROABS 2.0  --  1.5*  HGB 9.3* 15.6* 10.1*  HCT 29.7* 46.0 32.8*  MCV 88.1  --  89.1  PLT 300  --  093   Basic Metabolic Panel: Recent Labs  Lab 06/03/18 1226 06/03/18 1236 06/04/18 1209  NA 135 132* 137  K 4.0 3.8 4.2  CL 92*  --  98  CO2 30  --  27  GLUCOSE 258*  --  340*  BUN 21*  --  15  CREATININE 1.05*  --  0.68  CALCIUM 9.4  --  8.8*   GFR: Estimated Creatinine Clearance: 96.4 mL/min (by C-G formula based on SCr of 0.68 mg/dL). Liver Function Tests: Recent Labs  Lab 06/03/18 1226 06/04/18 1209  AST 35 17  ALT 20 17  ALKPHOS 97 85  BILITOT 0.6 0.6  PROT 7.2 6.9  ALBUMIN 2.9* 3.0*   Recent Labs  Lab 06/03/18 1226 06/04/18 1209  LIPASE 36 19   No results for input(s): AMMONIA in the last 168 hours. Coagulation Profile: No results for input(s): INR, PROTIME in the last 168 hours. Cardiac Enzymes: Recent Labs  Lab 06/03/18 1226  CKTOTAL 49   BNP (last 3 results) No results for input(s): PROBNP in the last 8760 hours. HbA1C: No results for input(s): HGBA1C in the last 72 hours. CBG: Recent Labs  Lab 06/03/18 1218  GLUCAP 298*   Lipid Profile: No results for input(s): CHOL, HDL, LDLCALC, TRIG, CHOLHDL, LDLDIRECT in the last 72 hours. Thyroid Function Tests: No results for input(s): TSH, T4TOTAL, FREET4, T3FREE, THYROIDAB in the last 72 hours. Anemia Panel: No results for input(s): VITAMINB12, FOLATE, FERRITIN, TIBC, IRON, RETICCTPCT in the last 72 hours. Urine analysis:    Component Value Date/Time   COLORURINE YELLOW 06/04/2018 1209   APPEARANCEUR  CLEAR 06/04/2018 1209   LABSPEC 1.020 06/04/2018 1209   PHURINE 7.0 06/04/2018 1209   GLUCOSEU >=500 (A) 06/04/2018 1209   HGBUR SMALL (A) 06/04/2018 1209   BILIRUBINUR NEGATIVE 06/04/2018 1209   BILIRUBINUR negative 01/02/2015 1717   KETONESUR 20 (A) 06/04/2018 1209   PROTEINUR 100 (A) 06/04/2018 1209   UROBILINOGEN 0.2 01/13/2015 0223   NITRITE NEGATIVE 06/04/2018 1209   LEUKOCYTESUR NEGATIVE 06/04/2018 1209    Radiological Exams on Admission: No results found.  EKG: Independently reviewed.  Normal sinus rhythm.  QTC is 451.  Assessment/Plan Principal Problem:   Gastroparesis Active Problems:   Hypertension associated with diabetes ([place])   Diabetes mellitus type 1 (HCC)   GERD (gastroesophageal reflux disease)   Diabetic gastroparesis associated with type 1 diabetes mellitus ([place])     1.  Abdominal pain with nausea vomiting, intractable with diabetic gastroparesis: Admit due to severity of symptoms Adequate nausea medications, IV fluids, IV opiates for pain relief. Monitor electrolytes closely.  Will keep n.p.o. as she is with intractable symptoms now.  Allow ice chips.  2.  Type 1 diabetes: On insulin.  Patient currently without evidence of DKA.  Her blood sugars are fairly controlled. We will keep her on reduced dose of Levemir until she is eating We will keep on sliding scale insulin every 4 hours with very close monitoring. Any worsening symptoms or increasing blood sugars, she will need insulin infusion.  3.  Accelerated hypertension: Resume home medications.  Hopefully pain medications and nausea medication will help.     DVT prophylaxis: Lovenox Code Status: Full code Family Communication: Husband at the bedside Disposition Plan: Home when is stable Consults called: None Admission status: Inpatient   Barb Merino MD [facility] Pager [phone]  If 7PM-7AM, please contact night-coverage [email or web address] Password [facility]  06/04/2018, 2:44 PM

## 2018-06-04 NOTE — ED Notes (Signed)
ED TO INPATIENT HANDOFF REPORT  ED Nurse Name and Phone #: mike [phone]  S Name/Age/Gender [name] 28 y.o. female Room/Bed: 047C/047C  Code Status   Code Status: Prior  Home/SNF/Other Home Patient oriented to: self Is this baseline? Yes   Triage Complete: Triage complete  Chief Complaint n v and stomach pain  Triage Note Pt arrives to ED from home with complaints of N/V since Tuesday. Pt reports hx of gastroparesis Pt vomited six times today. Vomit was yellow tinged.    Allergies Allergies  Allergen Reactions  . Other Anaphylaxis    Reaction to [place] nuts   . Lactose Intolerance (Gi) Diarrhea    Level of Care/Admitting Diagnosis ED Disposition    ED Disposition Condition Comment   Admit  Hospital Area: [facility] [[id number]]  Level of Care: Medical Telemetry [104]  Diagnosis: Diabetic gastroparesis associated with type 1 diabetes mellitus [facility]) [[id number]]  Admitting Physician: Dorcas Carrow [[id number]]  Attending Physician: Dorcas Carrow [[id number]]  Estimated length of stay: past midnight tomorrow  Certification:: I certify this patient will need inpatient services for at least 2 midnights  PT Class (Do Not Modify): Inpatient [101]  PT Acc Code (Do Not Modify): Private [1]       B Medical/Surgery History Past Medical History:  Diagnosis Date  . Allergy   . Anemia   . Anxiety   . Blood transfusion without reported diagnosis    Dec 2018  . Cataract    right eye  . Depression   . Diabetes type 1, uncontrolled (HCC) 11/14/2011   Since age 20  . Fibromyalgia   . Gastroparesis   . GERD (gastroesophageal reflux disease)   . Hypertension   . Infection    UTI April 2016   Past Surgical History:  Procedure Laterality Date  . ANKLE SURGERY    . CHOLECYSTECTOMY  11/15/2011   Procedure: LAPAROSCOPIC CHOLECYSTECTOMY WITH INTRAOPERATIVE CHOLANGIOGRAM;  Surgeon: [name], MD;  Location: [facility];  Service: General;  Laterality: N/A;   . COLONOSCOPY    . COLONOSCOPY WITH PROPOFOL N/A 06/27/2017   Procedure: COLONOSCOPY WITH PROPOFOL;  Surgeon: Rachael Fee, MD;  Location: [facility];  Service: Endoscopy;  Laterality: N/A;  . ESOPHAGOGASTRODUODENOSCOPY  12/03/2011   Procedure: ESOPHAGOGASTRODUODENOSCOPY (EGD);  Surgeon: Theda Belfast, MD;  Location: [facility];  Service: Endoscopy;  Laterality: N/A;  . FLEXIBLE SIGMOIDOSCOPY N/A 03/10/2017   Procedure: FLEXIBLE SIGMOIDOSCOPY;  Surgeon: Jeani Hawking, MD;  Location: [facility];  Service: Endoscopy;  Laterality: N/A;  . INCISION AND DRAINAGE PERIRECTAL ABSCESS N/A 03/01/2017   Procedure: IRRIGATION AND DEBRIDEMENT PERIRECTAL ABSCESS;  Surgeon: Ovidio Kin, MD;  Location: [facility];  Service: General;  Laterality: N/A;  . IRRIGATION AND DEBRIDEMENT BUTTOCKS N/A 03/23/2017   Procedure: IRRIGATION AND DEBRIDEMENT BUTTOCKS, SETON PLACEMENT;  Surgeon: Romie Levee, MD;  Location: [facility];  Service: General;  Laterality: N/A;  . LAPAROSCOPY  11/23/2011   Procedure: LAPAROSCOPY DIAGNOSTIC;  Surgeon: Mariella Saa, MD;  Location: [facility];  Service: General;  Laterality: N/A;  . SIGMOIDOSCOPY    . UPPER GASTROINTESTINAL ENDOSCOPY    . WISDOM TOOTH EXTRACTION       A IV Location/Drains/Wounds Patient Lines/Drains/Airways Status   Active Line/Drains/Airways    Name:   Placement date:   Placement time:   Site:   Days:   Peripheral IV 06/04/18 Left Antecubital   06/04/18    1242    Antecubital   less than 1  Intake/Output Last 24 hours No intake or output data in the 24 hours ending 06/04/18 1628  Labs/Imaging Results for orders placed or performed during the hospital encounter of 06/04/18 (from the past 48 hour(s))  CBC with Differential     Status: Abnormal   Collection Time: 06/04/18 12:09 PM  Result Value Ref Range   WBC 3.5 (L) 4.0 - 10.5 K/uL   RBC 3.68 (L) 3.87 - 5.11 MIL/uL   Hemoglobin 10.1 (L) 12.0 - 15.0 g/dL    Comment: REPEATED TO VERIFY    HCT 32.8 (L) 36.0 - 46.0 %   MCV 89.1 80.0 - 100.0 fL   MCH 27.4 26.0 - 34.0 pg   MCHC 30.8 30.0 - 36.0 g/dL   RDW 34.7 42.5 - 95.6 %   Platelets 279 150 - 400 K/uL   nRBC 0.0 0.0 - 0.2 %   Neutrophils Relative % 42 %   Neutro Abs 1.5 (L) 1.7 - 7.7 K/uL   Lymphocytes Relative 46 %   Lymphs Abs 1.6 0.7 - 4.0 K/uL   Monocytes Relative 9 %   Monocytes Absolute 0.3 0.1 - 1.0 K/uL   Eosinophils Relative 2 %   Eosinophils Absolute 0.1 0.0 - 0.5 K/uL   Basophils Relative 1 %   Basophils Absolute 0.0 0.0 - 0.1 K/uL   Immature Granulocytes 0 %   Abs Immature Granulocytes 0.01 0.00 - 0.07 K/uL    Comment: Performed at [facility], [address], [place], [place] [address]  Comprehensive metabolic panel     Status: Abnormal   Collection Time: 06/04/18 12:09 PM  Result Value Ref Range   Sodium 137 135 - 145 mmol/L   Potassium 4.2 3.5 - 5.1 mmol/L   Chloride 98 98 - 111 mmol/L   CO2 27 22 - 32 mmol/L   Glucose, Bld 340 (H) 70 - 99 mg/dL   BUN 15 6 - 20 mg/dL   Creatinine, Ser 4.33 0.44 - 1.00 mg/dL   Calcium 8.8 (L) 8.9 - 10.3 mg/dL   Total Protein 6.9 6.5 - 8.1 g/dL   Albumin 3.0 (L) 3.5 - 5.0 g/dL   AST 17 15 - 41 U/L   ALT 17 0 - 44 U/L   Alkaline Phosphatase 85 38 - 126 U/L   Total Bilirubin 0.6 0.3 - 1.2 mg/dL   GFR calc non Af Amer >60 >60 mL/min   GFR calc Af Amer >60 >60 mL/min   Anion gap 12 5 - 15    Comment: Performed at [facility], [address], [place], [place] [address]  Lipase, blood     Status: None   Collection Time: 06/04/18 12:09 PM  Result Value Ref Range   Lipase 19 11 - 51 U/L    Comment: Performed at [facility], [address], [place], [place] [address]  Urinalysis, Routine w reflex microscopic     Status: Abnormal   Collection Time: 06/04/18 12:09 PM  Result Value Ref Range   Color, Urine YELLOW YELLOW   APPearance CLEAR CLEAR   Specific Gravity, Urine 1.020 1.005 - 1.030   pH 7.0 5.0 - 8.0   Glucose, UA >=500 (A) NEGATIVE  mg/dL   Hgb urine dipstick SMALL (A) NEGATIVE   Bilirubin Urine NEGATIVE NEGATIVE   Ketones, ur 20 (A) NEGATIVE mg/dL   Protein, ur 063 (A) NEGATIVE mg/dL   Nitrite NEGATIVE NEGATIVE   Leukocytes,Ua NEGATIVE NEGATIVE   RBC / HPF 6-10 0 - 5  RBC/hpf   WBC, UA 0-5 0 - 5 WBC/hpf   Bacteria, UA NONE SEEN NONE SEEN   Squamous Epithelial / LPF 0-5 0 - 5   Mucus PRESENT    Hyaline Casts, UA PRESENT     Comment: Performed at [facility], [address], [place], [place] [address]  I-Stat beta hCG blood, ED     Status: None   Collection Time: 06/04/18 12:52 PM  Result Value Ref Range   I-stat hCG, quantitative <5.0 <5 mIU/mL   Comment 3            Comment:   GEST. AGE      CONC.  (mIU/mL)   <=1 WEEK        5 - 50     2 WEEKS       50 - 500     3 WEEKS       100 - 10,000     4 WEEKS     1,000 - 30,000        FEMALE AND NON-PREGNANT FEMALE:     LESS THAN 5 mIU/mL    Dg Abd 2 Views  Result Date: 06/04/2018 CLINICAL DATA:  Nausea vomiting and diarrhea. EXAM: ABDOMEN - 2 VIEW COMPARISON:  None. FINDINGS: The bowel gas pattern is normal. There is no evidence of free air. No radio-opaque calculi or other significant radiographic abnormality is seen. IMPRESSION: Negative. Electronically Signed   By: Ted Mcalpine M.D.   On: 06/04/2018 15:09    Pending Labs Unresulted Labs (From admission, onward)    Start     Ordered   Signed and Held  Basic metabolic panel  Tomorrow morning,   STAT     Signed and Held          Vitals/Pain Today's Vitals   06/04/18 1315 06/04/18 1330 06/04/18 1345 06/04/18 1500  BP:    (!) 150/98  Pulse: (!) 105 91 91 89  Resp:      Temp:      TempSrc:      SpO2: 99% 100% 98% 98%  PainSc:        Isolation Precautions No active isolations  Medications Medications  promethazine (PHENERGAN) injection 25 mg (25 mg Intravenous Given 06/04/18 1245)  sodium chloride 0.9 % bolus 1,000 mL (1,000 mLs Intravenous New Bag/Given 06/04/18 1244)  ondansetron  (ZOFRAN) injection 4 mg (4 mg Intravenous Given 06/04/18 1415)  fentaNYL (SUBLIMAZE) injection 50 mcg (50 mcg Intravenous Given 06/04/18 1415)    Mobility walks Low fall risk   Focused Assessments Cardiac Assessment Handoff:    Lab Results  Component Value Date   CKTOTAL 49 06/03/2018   CKMB 1.2 09/01/2008   TROPONINI <0.03 07/27/2017   Lab Results  Component Value Date   DDIMER <0.27 05/09/2013   Does the Patient currently have chest pain? No     R Recommendations: See Admitting Provider Note  Report given to:   Additional Notes: none

## 2018-06-05 DIAGNOSIS — E1159 Type 2 diabetes mellitus with other circulatory complications: Secondary | ICD-10-CM

## 2018-06-05 DIAGNOSIS — E1043 Type 1 diabetes mellitus with diabetic autonomic (poly)neuropathy: Principal | ICD-10-CM

## 2018-06-05 DIAGNOSIS — I10 Essential (primary) hypertension: Secondary | ICD-10-CM

## 2018-06-05 DIAGNOSIS — R1084 Generalized abdominal pain: Secondary | ICD-10-CM

## 2018-06-05 DIAGNOSIS — E1069 Type 1 diabetes mellitus with other specified complication: Secondary | ICD-10-CM

## 2018-06-05 LAB — BASIC METABOLIC PANEL
Anion gap: 14 (ref 5–15)
BUN: 12 mg/dL (ref 6–20)
CO2: 21 mmol/L — ABNORMAL LOW (ref 22–32)
Calcium: 8.8 mg/dL — ABNORMAL LOW (ref 8.9–10.3)
Chloride: 102 mmol/L (ref 98–111)
Creatinine, Ser: 0.67 mg/dL (ref 0.44–1.00)
GFR calc Af Amer: >60 mL/min (ref >60)
GFR calc non Af Amer: >60 mL/min (ref >60)
Glucose, Bld: 313 mg/dL — ABNORMAL HIGH (ref 70–99)
Potassium: 3.2 mmol/L — ABNORMAL LOW (ref 3.5–5.1)
Sodium: 137 mmol/L (ref 135–145)

## 2018-06-05 LAB — GLUCOSE, CAPILLARY
Glucose-Capillary: 122 mg/dL — ABNORMAL HIGH (ref 70–99)
Glucose-Capillary: 148 mg/dL — ABNORMAL HIGH (ref 70–99)
Glucose-Capillary: 169 mg/dL — ABNORMAL HIGH (ref 70–99)
Glucose-Capillary: 210 mg/dL — ABNORMAL HIGH (ref 70–99)
Glucose-Capillary: 244 mg/dL — ABNORMAL HIGH (ref 70–99)
Glucose-Capillary: 271 mg/dL — ABNORMAL HIGH (ref 70–99)

## 2018-06-05 LAB — RAPID URINE DRUG SCREEN, HOSP PERFORMED
Amphetamines: NOT DETECTED
Barbiturates: NOT DETECTED
Benzodiazepines: NOT DETECTED
Cocaine: NOT DETECTED
Opiates: POSITIVE — AB
Tetrahydrocannabinol: POSITIVE — AB

## 2018-06-05 MED ORDER — SODIUM CHLORIDE 0.9 % IV SOLN
175.0000 mg | Freq: Once | INTRAVENOUS | Status: AC
  Administered 2018-06-05: 175 mg via INTRAVENOUS
  Filled 2018-06-05: qty 3.5

## 2018-06-05 MED ORDER — PROMETHAZINE HCL 12.5 MG RE SUPP
12.5000 mg | Freq: Four times a day (QID) | RECTAL | Status: DC | PRN
  Administered 2018-06-05 – 2018-06-07 (×4): 12.5 mg via RECTAL
  Filled 2018-06-05 (×8): qty 1

## 2018-06-05 MED ORDER — ONDANSETRON HCL 4 MG/2ML IJ SOLN
4.0000 mg | Freq: Four times a day (QID) | INTRAMUSCULAR | Status: DC
  Administered 2018-06-05 – 2018-06-10 (×16): 4 mg via INTRAVENOUS
  Filled 2018-06-05 (×18): qty 2

## 2018-06-05 MED ORDER — HYOSCYAMINE SULFATE 0.125 MG SL SUBL
0.1250 mg | SUBLINGUAL_TABLET | Freq: Four times a day (QID) | SUBLINGUAL | Status: DC
  Administered 2018-06-05 – 2018-06-10 (×19): 0.125 mg via SUBLINGUAL
  Filled 2018-06-05 (×23): qty 1

## 2018-06-05 MED ORDER — SODIUM CHLORIDE 0.9 % IV SOLN
INTRAVENOUS | Status: DC
  Administered 2018-06-05: 14:00:00 via INTRAVENOUS
  Filled 2018-06-05 (×4): qty 1000

## 2018-06-05 MED ORDER — PANTOPRAZOLE SODIUM 40 MG IV SOLR
40.0000 mg | Freq: Two times a day (BID) | INTRAVENOUS | Status: DC
  Administered 2018-06-05 – 2018-06-10 (×11): 40 mg via INTRAVENOUS
  Filled 2018-06-05 (×11): qty 40

## 2018-06-05 MED ORDER — MORPHINE SULFATE (PF) 2 MG/ML IV SOLN
2.0000 mg | Freq: Once | INTRAVENOUS | Status: AC
  Administered 2018-06-05: 2 mg via INTRAVENOUS
  Filled 2018-06-05: qty 1

## 2018-06-05 NOTE — Progress Notes (Signed)
PROGRESS NOTE  [name] HGD:[id number] DOB: 09-13-1990 DOA: 06/04/2018 PCP: [name], MD  HPI/Brief Narrative  [name] is a 28 y.o. year old female with medical history significant for type1 DM, gastroparesis with chronic abdominal pain and multiple admissions for similar symptoms who presented on 06/04/2018 with 2 days of abdominal pain, nausea/vomiting and was found to have acute on chronic abdominal pan presumed secondary to acute flare of gastroparesis    Assessment/Plan:  #Non- Intractable Nausea/Vomiting, stable. Suspect likely severe gastroparesis flare related to poor DM control. No abnormalities on abd XR. Negative pregnancy, wnl UA, wnl Lipase. On most recent admission in [place] Iv erythromycin and ativan relieved sypmtoms. Reglan does not work for her, monitor QTC closely. Also consideration for cyclical vomiting related to cannabis use, check UDS ( she denies).  Continue IVF. Add scheduled levsin 0.125 mg q6 hrs given known gastroparesis, monitor qtc, suppository phenergan PRN, Add IV protonix BID, Iv erythromycin 111m x 1 dose   #Acute on chronic abdominal pain, improving. States pain is better in her abdomen and ready to try clear liquids. Has lower back pain more from retching she states.  Suspect element of gastroparesis flare on top of her chronic epigastric pain. Encouraged by non-acute abd xr. Has had CT imaging last month that shows chronic intestinal thickening. Given improvement will discontinue further IV pain control. Add scheduled hyoscyamaine  #Hypokalemia. In setting of previous vomiting. Will replete by adding K to IVF and repeat in am. Will likely not do well with oral repletion given current nausea  #Chronic gastroparesis. May get gastric pacemaker at [facility]( 3/23 appt). Multiple admissions for flares that tend to improve with IV erythromycin and ativan while monitoring her QTC. Reglan does not work for her  #Chronic diarrhea. None here so far.  Closely monitor  #T1DM, hyperglycemia without dka. On reduced dose lantus given N/V and diminished appetite, SSI, CBG monitoring  #Lower leg edema (none present here). Evaluated by GI on 2/27 noted to have increased leg swelling and weight gain. Likely related to fluid resuscitation from previous admissions. Was started on lasix 20 mg as outpatient, will not resume here  #Hypertension, not at goal. Continue home losartan, monitor  #History of prolonged QT. Monitor with daily EKG. Judicious of QTc prolonting agents     Cultures:  none  Telemetry:no  DVT prophylaxis: lovenox Consultants:  None    Procedures:  none   Antimicrobials:  Code Status: Full Code   Family Communication: none at bedside   Disposition Plan: IV pro-motility agents, suppository antiemetic PRN, IVF slowly advance diet given improvement in abdominal pain, pain control.        Subjective Complaining of nausea and lower abdominal pain   Objective: Vitals:   06/04/18 1740 06/04/18 1851 06/04/18 2220 06/05/18 0805  BP: (!) 186/113 (!) 177/106 (!) 190/116 (!) 157/98  Pulse: (!) 107 97 99 (!) 103  Resp: 19  18 20   Temp: 98.1 F (36.7 C)  97.7 F (36.5 C) 98 F (36.7 C)  TempSrc: Oral  Oral Oral  SpO2: 100%  100% 100%  Weight: 57.7 kg     Height: 5' 7"  (1.702 m)       Intake/Output Summary (Last 24 hours) at 06/05/2018 1042 Last data filed at 06/05/2018 0300 Gross per 24 hour  Intake 30 ml  Output -  Net 30 ml   Filed Weights   06/04/18 1740  Weight: 57.7 kg    Exam:  Constitutional:normal appearing female  Eyes: EOMI, anicteric, normal conjunctivae ENMT: Oropharynx with moist mucous membranes Cardiovascular: RRR no MRGs, with no peripheral edema Respiratory: Normal respiratory effort on room air  Abdomen: Soft, not tender with deep palpation on exam, diminished bowel sounds Skin: No rash ulcers, or lesions. Without skin tenting  Neurologic: Grossly no focal neuro  deficit. Psychiatric:Appropriate affect, and mood. Mental status AAOx3  Data Reviewed: CBC: Recent Labs  Lab 06/03/18 1226 06/03/18 1236 06/04/18 1209  WBC 4.9  --  3.5*  NEUTROABS 2.0  --  1.5*  HGB 9.3* 15.6* 10.1*  HCT 29.7* 46.0 32.8*  MCV 88.1  --  89.1  PLT 300  --  233   Basic Metabolic Panel: Recent Labs  Lab 06/03/18 1226 06/03/18 1236 06/04/18 1209 06/05/18 0218  NA 135 132* 137 137  K 4.0 3.8 4.2 3.2*  CL 92*  --  98 102  CO2 30  --  27 21*  GLUCOSE 258*  --  340* 313*  BUN 21*  --  15 12  CREATININE 1.05*  --  0.68 0.67  CALCIUM 9.4  --  8.8* 8.8*   GFR: Estimated Creatinine Clearance: 95.4 mL/min (by C-G formula based on SCr of 0.67 mg/dL). Liver Function Tests: Recent Labs  Lab 06/03/18 1226 06/04/18 1209  AST 35 17  ALT 20 17  ALKPHOS 97 85  BILITOT 0.6 0.6  PROT 7.2 6.9  ALBUMIN 2.9* 3.0*   Recent Labs  Lab 06/03/18 1226 06/04/18 1209  LIPASE 36 19   No results for input(s): AMMONIA in the last 168 hours. Coagulation Profile: No results for input(s): INR, PROTIME in the last 168 hours. Cardiac Enzymes: Recent Labs  Lab 06/03/18 1226  CKTOTAL 49   BNP (last 3 results) No results for input(s): PROBNP in the last 8760 hours. HbA1C: No results for input(s): HGBA1C in the last 72 hours. CBG: Recent Labs  Lab 06/04/18 1813 06/04/18 1955 06/04/18 2359 06/05/18 0341 06/05/18 0832  GLUCAP 271* 183* 271* 244* 210*   Lipid Profile: No results for input(s): CHOL, HDL, LDLCALC, TRIG, CHOLHDL, LDLDIRECT in the last 72 hours. Thyroid Function Tests: No results for input(s): TSH, T4TOTAL, FREET4, T3FREE, THYROIDAB in the last 72 hours. Anemia Panel: No results for input(s): VITAMINB12, FOLATE, FERRITIN, TIBC, IRON, RETICCTPCT in the last 72 hours. Urine analysis:    Component Value Date/Time   COLORURINE YELLOW 06/04/2018 1209   APPEARANCEUR CLEAR 06/04/2018 1209   LABSPEC 1.020 06/04/2018 1209   PHURINE 7.0 06/04/2018 1209    GLUCOSEU >=500 (A) 06/04/2018 1209   HGBUR SMALL (A) 06/04/2018 1209   BILIRUBINUR NEGATIVE 06/04/2018 1209   BILIRUBINUR negative 01/02/2015 1717   KETONESUR 20 (A) 06/04/2018 1209   PROTEINUR 100 (A) 06/04/2018 1209   UROBILINOGEN 0.2 01/13/2015 0223   NITRITE NEGATIVE 06/04/2018 1209   LEUKOCYTESUR NEGATIVE 06/04/2018 1209   Sepsis Labs: @LABRCNTIP (procalcitonin:4,lacticidven:4)  ) Recent Results (from the past 240 hour(s))  Urine culture     Status: Abnormal   Collection Time: 06/03/18  5:00 PM  Result Value Ref Range Status   Specimen Description URINE, RANDOM  Final   Special Requests   Final    NONE Performed at [facility], [place] [address], [place], [place] [address]    Culture MULTIPLE SPECIES PRESENT, SUGGEST RECOLLECTION (A)  Final   Report Status 06/04/2018 FINAL  Final      Studies: Dg Abd 2 Views  Result Date: 06/04/2018 CLINICAL DATA:  Nausea vomiting and diarrhea. EXAM: ABDOMEN -  2 VIEW COMPARISON:  None. FINDINGS: The bowel gas pattern is normal. There is no evidence of free air. No radio-opaque calculi or other significant radiographic abnormality is seen. IMPRESSION: Negative. Electronically Signed   By: Fidela Salisbury M.D.   On: 06/04/2018 15:09    Scheduled Meds: . enoxaparin (LOVENOX) injection  40 mg Subcutaneous Q24H  . hyoscyamine  0.125 mg Sublingual Q6H  . insulin aspart  0-9 Units Subcutaneous Q4H  . insulin detemir  10 Units Subcutaneous QHS  . losartan  25 mg Oral Daily  . mirtazapine  30 mg Oral QHS  .  morphine injection  2 mg Intravenous Once  . ondansetron (ZOFRAN) IV  4 mg Intravenous Q6H  . pantoprazole (PROTONIX) IV  40 mg Intravenous Q12H    Continuous Infusions: . erythromycin    . sodium chloride 0.9 % 1,000 mL with potassium chloride 10 mEq infusion       LOS: 1 day     Desiree Hane, MD [facility]

## 2018-06-05 NOTE — Progress Notes (Signed)
BP 190/116.  10 mg Labetol given IVp

## 2018-06-05 NOTE — Progress Notes (Signed)
Pt crying states pain is 10/10 and with N/V.  Paged on call MD and awaiting orders

## 2018-06-06 DIAGNOSIS — K219 Gastro-esophageal reflux disease without esophagitis: Secondary | ICD-10-CM

## 2018-06-06 LAB — BASIC METABOLIC PANEL
Anion gap: 9 (ref 5–15)
BUN: 13 mg/dL (ref 6–20)
CO2: 28 mmol/L (ref 22–32)
Calcium: 8.6 mg/dL — ABNORMAL LOW (ref 8.9–10.3)
Chloride: 99 mmol/L (ref 98–111)
Creatinine, Ser: 0.56 mg/dL (ref 0.44–1.00)
GFR calc Af Amer: >60 mL/min (ref >60)
GFR calc non Af Amer: >60 mL/min (ref >60)
Glucose, Bld: 91 mg/dL (ref 70–99)
Potassium: 3.2 mmol/L — ABNORMAL LOW (ref 3.5–5.1)
Sodium: 136 mmol/L (ref 135–145)

## 2018-06-06 LAB — GLUCOSE, CAPILLARY
Glucose-Capillary: 102 mg/dL — ABNORMAL HIGH (ref 70–99)
Glucose-Capillary: 113 mg/dL — ABNORMAL HIGH (ref 70–99)
Glucose-Capillary: 135 mg/dL — ABNORMAL HIGH (ref 70–99)
Glucose-Capillary: 139 mg/dL — ABNORMAL HIGH (ref 70–99)
Glucose-Capillary: 145 mg/dL — ABNORMAL HIGH (ref 70–99)
Glucose-Capillary: 54 mg/dL — ABNORMAL LOW (ref 70–99)
Glucose-Capillary: 67 mg/dL — ABNORMAL LOW (ref 70–99)
Glucose-Capillary: 98 mg/dL (ref 70–99)

## 2018-06-06 LAB — MAGNESIUM: Magnesium: 1.9 mg/dL (ref 1.7–2.4)

## 2018-06-06 MED ORDER — INSULIN DETEMIR 100 UNIT/ML ~~LOC~~ SOLN
5.0000 [IU] | Freq: Every day | SUBCUTANEOUS | Status: DC
  Administered 2018-06-06 – 2018-06-09 (×4): 5 [IU] via SUBCUTANEOUS
  Filled 2018-06-06 (×4): qty 0.05

## 2018-06-06 MED ORDER — HYDROMORPHONE HCL 1 MG/ML IJ SOLN
0.5000 mg | INTRAMUSCULAR | Status: DC | PRN
  Administered 2018-06-06 – 2018-06-09 (×17): 0.5 mg via INTRAVENOUS
  Filled 2018-06-06 (×18): qty 1

## 2018-06-06 MED ORDER — POTASSIUM CHLORIDE 20 MEQ/15ML (10%) PO SOLN
40.0000 meq | ORAL | Status: AC
  Administered 2018-06-06: 40 meq via ORAL
  Filled 2018-06-06: qty 30

## 2018-06-06 MED ORDER — KCL IN DEXTROSE-NACL 20-5-0.9 MEQ/L-%-% IV SOLN
INTRAVENOUS | Status: DC
  Administered 2018-06-06 – 2018-06-10 (×8): via INTRAVENOUS
  Filled 2018-06-06 (×10): qty 1000

## 2018-06-06 MED ORDER — SODIUM CHLORIDE 0.9 % IV SOLN
175.0000 mg | Freq: Three times a day (TID) | INTRAVENOUS | Status: DC
  Administered 2018-06-06 – 2018-06-10 (×13): 175 mg via INTRAVENOUS
  Filled 2018-06-06 (×17): qty 3.5

## 2018-06-06 MED ORDER — SODIUM CHLORIDE 0.9 % IV SOLN
175.0000 mg | Freq: Four times a day (QID) | INTRAVENOUS | Status: DC
  Filled 2018-06-06: qty 3.5

## 2018-06-06 NOTE — Progress Notes (Signed)
Pt's blood sugar at 2000 was 122mg /dl, and at 8850 blood sugar was 65mg /dl pt given orange juice repeat blood sugar at 0300 was 98mg /dl and at 2774 she was 54mg /dl pt given two cups of orange juice with sugar and graham crackers. Pt stated she was to nauseated to eat. 0540 pt's repeat blood sugar was 102mg /dl. MD notified and N.O. received for MIVF with dextrose, Pharmacy notified. Pt given more orange juice while waiting for the new bag of MIVF.

## 2018-06-06 NOTE — Progress Notes (Signed)
PROGRESS NOTE  MARION SEESE YQI:[id number] DOB: 23-Jun-1990 DOA: 06/04/2018 PCP: [name], MD  HPI/Brief Narrative  CAROLYNNE SCHUCHARD is a 28 y.o. year old female with medical history significant for type1 DM, gastroparesis with chronic abdominal pain and multiple admissions for similar symptoms who presented on 06/04/2018 with 2 days of abdominal pain, nausea/vomiting and was found to have acute on chronic abdominal pan presumed secondary to acute flare of gastroparesis    Assessment/Plan: #Non- Intractable Nausea/Vomiting, stable.  Suspect likely severe gastroparesis flare related to poor DM control. No abnormalities on abd XR. Negative pregnancy, wnl UA, wnl Lipase. On most recent admission in [place] [facility] erythromycin and ativan relieved sypmtoms. Reglan does not work for her, monitor QTC closely. Also consideration for cyclical vomiting related to cannabis use,( she denies, but positive UDS).   -Continue IVF.  - scheduled levsin 0.125 mg q6 hrs given known gastroparesis,  - daily ekg monitor qtc, suppository phenergan PRN,  IV protonix BID, -Increase from one-time dosing to scheduled IV erythromycin every 6 hours172m, QTC within normal limits we will continue to monitor - Scheduled Zofran, PRN suppository Phenergan  #Acute on chronic abdominal pain, worsening.  Was not able to tolerate oral intake.  Will now make n.p.o.  Encouraged by nonacute abdominal x-ray.  Suspect element of gastroparesis flare on top of her chronic epigastric pain. Has had CT imaging last month that shows chronic intestinal thickening.  - scheduled hyoscyamaine - Make n.p.o., while n.p.o. continue IV as needed Dilaudid, -Anticipate improvement in pain control now that erythromycin scheduled  #Hypokalemia.  Persistent in the setting of previous vomiting. -D5 normal saline with K, daily BMP -Make n.p.o., hopeful improvement with IV pain control - Scheduled Zofran, PRN suppository Phenergan  #Chronic  gastroparesis. [facility]( [facility] appt). Multiple admissions for flares that tend to improve with IV erythromycin and ativan while monitoring her QTC. Reglan does not work for her  #Chronic diarrhea. None here so far. Closely monitor  #T1DM, hyperglycemia without dka. -On reduced dose lantus (reduced further from 10 units to 5 units) given N/V and diminished appetite and hypoglycemic episodes on 3/8 a.m., SSI, CBG monitoring  #Lower leg edema (none present here). Evaluated by GI on 2/27 noted to have increased leg swelling and weight gain. Likely related to fluid resuscitation from previous admissions. Was started on lasix 20 mg as outpatient, will not resume here  #Hypertension, not at goal but improving. Continue home losartan, monitor  #History of prolonged QT. Monitor with daily EKG. On multiple prolonging QTC agents    Cultures:  none  Telemetry:no  DVT prophylaxis: lovenox Consultants:  None    Procedures:  none   Antimicrobials:  Code Status: Full Code   Family Communication: none at bedside   Disposition Plan: IV pro-motility agents, suppository antiemetic PRN, IVF slowly advance diet when abdominal pain improves, pain control.        Subjective Complains of persistent nausea Was able to tolerate some Sprite but did have emesis per her report Hypoglycemic this a.m. requiring orange juice Still having persistent abdominal pain wants to hold off on further diet   Objective: Vitals:   06/05/18 0805 06/05/18 1714 06/06/18 0039 06/06/18 0910  BP: (!) 157/98 (!) 159/92 (!) 165/95 129/87  Pulse: (!) 103 (!) 108 (!) 106 98  Resp: 20 18 16 19   Temp: 98 F (36.7 C) 98.2 F (36.8 C) 98.4 F (36.9 C) 98.7 F (37.1 C)  TempSrc: Oral Oral  Oral Oral  SpO2: 100% 99% 99% 98%  Weight:      Height:        Intake/Output Summary (Last 24 hours) at 06/06/2018 1507 Last data filed at 06/06/2018 0100 Gross per 24 hour  Intake 240 ml  Output -   Net 240 ml   Filed Weights   06/04/18 1740  Weight: 57.7 kg    Exam:  Constitutional: Tearful young female Eyes: EOMI, anicteric, normal conjunctivae ENMT: Oropharynx with moist mucous membranes Cardiovascular: RRR no MRGs, with no peripheral edema Respiratory: Normal respiratory effort on room air  Abdomen: Soft, tender with deep palpation on exam, diminished bowel sounds Skin: No rash ulcers, or lesions. Without skin tenting  Neurologic: Grossly no focal neuro deficit. Psychiatric:Appropriate affect, and mood. Mental status AAOx3  Data Reviewed: CBC: Recent Labs  Lab 06/03/18 1226 06/03/18 1236 06/04/18 1209  WBC 4.9  --  3.5*  NEUTROABS 2.0  --  1.5*  HGB 9.3* 15.6* 10.1*  HCT 29.7* 46.0 32.8*  MCV 88.1  --  89.1  PLT 300  --  277   Basic Metabolic Panel: Recent Labs  Lab 06/03/18 1226 06/03/18 1236 06/04/18 1209 06/05/18 0218 06/06/18 0338  NA 135 132* 137 137 136  K 4.0 3.8 4.2 3.2* 3.2*  CL 92*  --  98 102 99  CO2 30  --  27 21* 28  GLUCOSE 258*  --  340* 313* 91  BUN 21*  --  15 12 13   CREATININE 1.05*  --  0.68 0.67 0.56  CALCIUM 9.4  --  8.8* 8.8* 8.6*  MG  --   --   --   --  1.9   GFR: Estimated Creatinine Clearance: 95.4 mL/min (by C-G formula based on SCr of 0.56 mg/dL). Liver Function Tests: Recent Labs  Lab 06/03/18 1226 06/04/18 1209  AST 35 17  ALT 20 17  ALKPHOS 97 85  BILITOT 0.6 0.6  PROT 7.2 6.9  ALBUMIN 2.9* 3.0*   Recent Labs  Lab 06/03/18 1226 06/04/18 1209  LIPASE 36 19   No results for input(s): AMMONIA in the last 168 hours. Coagulation Profile: No results for input(s): INR, PROTIME in the last 168 hours. Cardiac Enzymes: Recent Labs  Lab 06/03/18 1226  CKTOTAL 49   BNP (last 3 results) No results for input(s): PROBNP in the last 8760 hours. HbA1C: No results for input(s): HGBA1C in the last 72 hours. CBG: Recent Labs  Lab 06/06/18 0321 06/06/18 0514 06/06/18 0543 06/06/18 0751 06/06/18 1156   GLUCAP 98 54* 102* 135* 113*   Lipid Profile: No results for input(s): CHOL, HDL, LDLCALC, TRIG, CHOLHDL, LDLDIRECT in the last 72 hours. Thyroid Function Tests: No results for input(s): TSH, T4TOTAL, FREET4, T3FREE, THYROIDAB in the last 72 hours. Anemia Panel: No results for input(s): VITAMINB12, FOLATE, FERRITIN, TIBC, IRON, RETICCTPCT in the last 72 hours. Urine analysis:    Component Value Date/Time   COLORURINE YELLOW 06/04/2018 1209   APPEARANCEUR CLEAR 06/04/2018 1209   LABSPEC 1.020 06/04/2018 1209   PHURINE 7.0 06/04/2018 1209   GLUCOSEU >=500 (A) 06/04/2018 1209   HGBUR SMALL (A) 06/04/2018 1209   BILIRUBINUR NEGATIVE 06/04/2018 1209   BILIRUBINUR negative 01/02/2015 1717   KETONESUR 20 (A) 06/04/2018 1209   PROTEINUR 100 (A) 06/04/2018 1209   UROBILINOGEN 0.2 01/13/2015 0223   NITRITE NEGATIVE 06/04/2018 1209   LEUKOCYTESUR NEGATIVE 06/04/2018 1209   Sepsis Labs: @LABRCNTIP (procalcitonin:4,lacticidven:4)  ) Recent Results (from the past 240 hour(s))  Urine  culture     Status: Abnormal   Collection Time: 06/03/18  5:00 PM  Result Value Ref Range Status   Specimen Description URINE, RANDOM  Final   Special Requests   Final    NONE Performed at [facility], [address], [place], [place] [address]    Culture MULTIPLE SPECIES PRESENT, SUGGEST RECOLLECTION (A)  Final   Report Status 06/04/2018 FINAL  Final      Studies: No results found.  Scheduled Meds: . enoxaparin (LOVENOX) injection  40 mg Subcutaneous Q24H  . hyoscyamine  0.125 mg Sublingual Q6H  . insulin aspart  0-9 Units Subcutaneous Q4H  . insulin detemir  5 Units Subcutaneous QHS  . losartan  25 mg Oral Daily  . mirtazapine  30 mg Oral QHS  . ondansetron (ZOFRAN) IV  4 mg Intravenous Q6H  . pantoprazole (PROTONIX) IV  40 mg Intravenous Q12H    Continuous Infusions: . dextrose 5 % and 0.9 % NaCl with KCl 20 mEq/L 100 mL/hr at 06/06/18 0704  . erythromycin 175 mg (06/06/18 0925)      LOS: 2 days     Desiree Hane, MD [facility]

## 2018-06-07 DIAGNOSIS — R1115 Cyclical vomiting syndrome unrelated to migraine: Secondary | ICD-10-CM

## 2018-06-07 DIAGNOSIS — E860 Dehydration: Secondary | ICD-10-CM

## 2018-06-07 DIAGNOSIS — R1110 Vomiting, unspecified: Secondary | ICD-10-CM | POA: Diagnosis present

## 2018-06-07 LAB — CBC
HCT: 33.4 % — ABNORMAL LOW (ref 36.0–46.0)
Hemoglobin: 10.9 g/dL — ABNORMAL LOW (ref 12.0–15.0)
MCH: 27.9 pg (ref 26.0–34.0)
MCHC: 32.6 g/dL (ref 30.0–36.0)
MCV: 85.6 fL (ref 80.0–100.0)
Platelets: 326 10*3/uL (ref 150–400)
RBC: 3.9 MIL/uL (ref 3.87–5.11)
RDW: 13.2 % (ref 11.5–15.5)
WBC: 3.7 10*3/uL — ABNORMAL LOW (ref 4.0–10.5)
nRBC: 0 % (ref 0.0–0.2)

## 2018-06-07 LAB — BASIC METABOLIC PANEL
Anion gap: 8 (ref 5–15)
BUN: 9 mg/dL (ref 6–20)
CO2: 27 mmol/L (ref 22–32)
Calcium: 8.3 mg/dL — ABNORMAL LOW (ref 8.9–10.3)
Chloride: 102 mmol/L (ref 98–111)
Creatinine, Ser: 0.46 mg/dL (ref 0.44–1.00)
GFR calc Af Amer: >60 mL/min (ref >60)
GFR calc non Af Amer: >60 mL/min (ref >60)
Glucose, Bld: 149 mg/dL — ABNORMAL HIGH (ref 70–99)
Potassium: 2.9 mmol/L — ABNORMAL LOW (ref 3.5–5.1)
Sodium: 137 mmol/L (ref 135–145)

## 2018-06-07 LAB — GLUCOSE, CAPILLARY
Glucose-Capillary: 102 mg/dL — ABNORMAL HIGH (ref 70–99)
Glucose-Capillary: 119 mg/dL — ABNORMAL HIGH (ref 70–99)
Glucose-Capillary: 123 mg/dL — ABNORMAL HIGH (ref 70–99)
Glucose-Capillary: 138 mg/dL — ABNORMAL HIGH (ref 70–99)
Glucose-Capillary: 160 mg/dL — ABNORMAL HIGH (ref 70–99)
Glucose-Capillary: 198 mg/dL — ABNORMAL HIGH (ref 70–99)

## 2018-06-07 MED ORDER — POTASSIUM CHLORIDE 10 MEQ/100ML IV SOLN
10.0000 meq | INTRAVENOUS | Status: AC
  Administered 2018-06-07 (×5): 10 meq via INTRAVENOUS
  Filled 2018-06-07 (×5): qty 100

## 2018-06-07 MED ORDER — LORAZEPAM 2 MG/ML IJ SOLN
0.2500 mg | Freq: Two times a day (BID) | INTRAMUSCULAR | Status: DC
  Administered 2018-06-07 – 2018-06-10 (×6): 0.25 mg via INTRAVENOUS
  Filled 2018-06-07 (×6): qty 1

## 2018-06-07 NOTE — Progress Notes (Signed)
PROGRESS NOTE  [name] PPJ:[id number] DOB: [birth year] DOA: 06/04/2018 PCP: [name], MD  HPI/Brief Narrative  [name] is a 28 y.o. year old female with medical history significant for type1 DM, gastroparesis with chronic abdominal pain and multiple admissions for similar symptoms who presented on 06/04/2018 with 2 days of abdominal pain, nausea/vomiting and was found to have acute on chronic abdominal pan presumed secondary to acute flare of gastroparesis  Hospital course: Abdominal x-ray negative Negative pregnancy, with normal limits UA within normal limits lipase. Has been on scheduled IV erythromycin, scheduled Levsin, PRN Phenergan, IV Protonix twice daily, scheduled Zofran since 3/7   Assessment/Plan: #Intractable nausea and vomiting, suspect multifactorial etiology (diabetic gastroparesis flare and hyperemesis cannabinoid syndrome) Persistent abdominal pain that improves with hot showers seems most concerning for hyperemesis cannabinoid syndrome; however cannot rule out severe gastroparesis flare related to poor DM control.  On most recent admission in [place] erythromycin and ativan relieved sypmtoms so we will continue with that plan - Daily EKG, monitor QTC -Continue IV fluids since n.p.o.  -Scheduled erythromycin 175 mg every 6 hours - scheduled levsin 0.125 mg q6 hrs given known gastroparesis,  - suppository phenergan PRN, scheduled Zofran -IV protonix BID,  #Acute on chronic abdominal pain, worsening.  Abdominal pain persists, has not been able to advance from n.p.o. status.  Will add IV Ativan as benzos can help with cyclical vomiting related to hyperemesis cannabinoid syndrome. Nonacute abdomen on exam . -Continue regimen mentioned above -Add IV Ativan 0.25 mg twice daily - scheduled hyoscyamaine - while n.p.o. continue IV as needed Dilaudid, plan to transition to oral pain regimen once able to tolerate diet -If no improvement with scheduled benzos  will consult GI  #Hypokalemia.  Persistent in the setting of previous vomiting. -D5 normal saline with K, daily BMP -IV potassium repletion -- Scheduled Zofran, PRN suppository Phenergan  #Chronic gastroparesis. May get gastric pacemaker at [facility] ( 3/23 appt). Multiple admissions for flares that tend to improve with IV erythromycin and ativan while monitoring her QTC. Reglan does not work for her  Marijuana use UDS positive.  Admits to significant amount of marijuana (unable to quantify) daily for the past year. -Encourage cessation as this could be be a contributing factor to her abdominal pain  #Chronic diarrhea. None here so far. Closely monitor  #T1DM, hyperglycemia without dka. -On reduced dose lantus (5 units) given N/V and diminished appetite and n.p.o. status.  Last hypoglycemic episodes on 3/8 a.m., SSI, CBG monitoring  #Lower leg edema (none present here), resolved. Evaluated by GI on 2/27 noted to have increased leg swelling and weight gain. Likely related to fluid resuscitation from previous admissions. Was started on lasix 20 mg as outpatient, will not resume here  #Hypertension, not at goal.  SBP's in the 160s.  Likely related to pain.  Increase home losartan, monitor  #History of prolonged QT. Monitor with daily EKG. On multiple prolonging QTC agents    Cultures:  none  Telemetry:no  DVT prophylaxis: lovenox Consultants:  None    Procedures:  none   Antimicrobials:  Code Status: Full Code   Family Communication: none at bedside   Disposition Plan: IV Ativan, IV pro-motility agents, suppository antiemetic PRN, IVF slowly advance diet when abdominal pain improves, pain control.        Subjective Still nauseous, persistent abdominal pain better with hot showers No desire to eat given belly pain Still vomiting   Objective: Vitals:   06/06/18 1658 06/06/18  2333 06/07/18 0914 06/07/18 1612  BP: (!) 152/102 121/73 (!) 177/113 (!) 168/106  Pulse:  (!) 102 87 100 (!) 103  Resp: 18 16 18 18   Temp: 98.1 F (36.7 C) 97.8 F (36.6 C) 98.7 F (37.1 C) 98 F (36.7 C)  TempSrc: Oral Oral Oral Oral  SpO2: 100% 99% 98% 99%  Weight:      Height:        Intake/Output Summary (Last 24 hours) at 06/07/2018 1640 Last data filed at 06/07/2018 0200 Gross per 24 hour  Intake 1650.06 ml  Output 850 ml  Net 800.06 ml   Filed Weights   06/04/18 1740  Weight: 57.7 kg    Exam:  Constitutional: Uncomfortable but in no acute distress Eyes: EOMI, anicteric, normal conjunctivae ENMT: Oropharynx with dry mucous membranes Cardiovascular: RRR no MRGs, with no peripheral edema Respiratory: Normal respiratory effort on room air  Abdomen: Soft, tender with deep palpation on exam, diminished bowel sounds Skin: No rash ulcers, or lesions. Without skin tenting  Neurologic: Grossly no focal neuro deficit. Psychiatric:Appropriate affect, and mood. Mental status AAOx3  Data Reviewed: CBC: Recent Labs  Lab 06/03/18 1226 06/03/18 1236 06/04/18 1209 06/07/18 0235  WBC 4.9  --  3.5* 3.7*  NEUTROABS 2.0  --  1.5*  --   HGB 9.3* 15.6* 10.1* 10.9*  HCT 29.7* 46.0 32.8* 33.4*  MCV 88.1  --  89.1 85.6  PLT 300  --  279 656   Basic Metabolic Panel: Recent Labs  Lab 06/03/18 1226 06/03/18 1236 06/04/18 1209 06/05/18 0218 06/06/18 0338 06/07/18 0235  NA 135 132* 137 137 136 137  K 4.0 3.8 4.2 3.2* 3.2* 2.9*  CL 92*  --  98 102 99 102  CO2 30  --  27 21* 28 27  GLUCOSE 258*  --  340* 313* 91 149*  BUN 21*  --  15 12 13 9   CREATININE 1.05*  --  0.68 0.67 0.56 0.46  CALCIUM 9.4  --  8.8* 8.8* 8.6* 8.3*  MG  --   --   --   --  1.9  --    GFR: Estimated Creatinine Clearance: 95.4 mL/min (by C-G formula based on SCr of 0.46 mg/dL). Liver Function Tests: Recent Labs  Lab 06/03/18 1226 06/04/18 1209  AST 35 17  ALT 20 17  ALKPHOS 97 85  BILITOT 0.6 0.6  PROT 7.2 6.9  ALBUMIN 2.9* 3.0*   Recent Labs  Lab 06/03/18 1226 06/04/18 1209   LIPASE 36 19   No results for input(s): AMMONIA in the last 168 hours. Coagulation Profile: No results for input(s): INR, PROTIME in the last 168 hours. Cardiac Enzymes: Recent Labs  Lab 06/03/18 1226  CKTOTAL 49   BNP (last 3 results) No results for input(s): PROBNP in the last 8760 hours. HbA1C: No results for input(s): HGBA1C in the last 72 hours. CBG: Recent Labs  Lab 06/07/18 0000 06/07/18 0410 06/07/18 0724 06/07/18 1215 06/07/18 1559  GLUCAP 138* 102* 123* 119* 160*   Lipid Profile: No results for input(s): CHOL, HDL, LDLCALC, TRIG, CHOLHDL, LDLDIRECT in the last 72 hours. Thyroid Function Tests: No results for input(s): TSH, T4TOTAL, FREET4, T3FREE, THYROIDAB in the last 72 hours. Anemia Panel: No results for input(s): VITAMINB12, FOLATE, FERRITIN, TIBC, IRON, RETICCTPCT in the last 72 hours. Urine analysis:    Component Value Date/Time   COLORURINE YELLOW 06/04/2018 1209   APPEARANCEUR CLEAR 06/04/2018 1209   LABSPEC 1.020 06/04/2018 1209   PHURINE  7.0 06/04/2018 1209   GLUCOSEU >=500 (A) 06/04/2018 1209   HGBUR SMALL (A) 06/04/2018 1209   BILIRUBINUR NEGATIVE 06/04/2018 1209   BILIRUBINUR negative 01/02/2015 1717   KETONESUR 20 (A) 06/04/2018 1209   PROTEINUR 100 (A) 06/04/2018 1209   UROBILINOGEN 0.2 01/13/2015 0223   NITRITE NEGATIVE 06/04/2018 1209   LEUKOCYTESUR NEGATIVE 06/04/2018 1209   Sepsis Labs: @LABRCNTIP (procalcitonin:4,lacticidven:4)  ) Recent Results (from the past 240 hour(s))  Urine culture     Status: Abnormal   Collection Time: 06/03/18  5:00 PM  Result Value Ref Range Status   Specimen Description URINE, RANDOM  Final   Special Requests   Final    NONE Performed at [facility], [place] [address] [place], [place] [address]    Culture MULTIPLE SPECIES PRESENT, SUGGEST RECOLLECTION (A)  Final   Report Status 06/04/2018 FINAL  Final      Studies: No results found.  Scheduled Meds: . enoxaparin (LOVENOX) injection   40 mg Subcutaneous Q24H  . hyoscyamine  0.125 mg Sublingual Q6H  . insulin aspart  0-9 Units Subcutaneous Q4H  . insulin detemir  5 Units Subcutaneous QHS  . losartan  25 mg Oral Daily  . mirtazapine  30 mg Oral QHS  . ondansetron (ZOFRAN) IV  4 mg Intravenous Q6H  . pantoprazole (PROTONIX) IV  40 mg Intravenous Q12H    Continuous Infusions: . dextrose 5 % and 0.9 % NaCl with KCl 20 mEq/L 100 mL/hr at 06/07/18 0100  . erythromycin 175 mg (06/07/18 1401)     LOS: 3 days     Kathryn Hane, MD [facility]

## 2018-06-08 DIAGNOSIS — R112 Nausea with vomiting, unspecified: Secondary | ICD-10-CM

## 2018-06-08 DIAGNOSIS — R1110 Vomiting, unspecified: Secondary | ICD-10-CM

## 2018-06-08 LAB — BASIC METABOLIC PANEL
Anion gap: 6 (ref 5–15)
BUN: 8 mg/dL (ref 6–20)
CO2: 25 mmol/L (ref 22–32)
Calcium: 8.1 mg/dL — ABNORMAL LOW (ref 8.9–10.3)
Chloride: 106 mmol/L (ref 98–111)
Creatinine, Ser: 0.51 mg/dL (ref 0.44–1.00)
GFR calc Af Amer: >60 mL/min (ref >60)
GFR calc non Af Amer: >60 mL/min (ref >60)
Glucose, Bld: 137 mg/dL — ABNORMAL HIGH (ref 70–99)
Potassium: 3.2 mmol/L — ABNORMAL LOW (ref 3.5–5.1)
Sodium: 137 mmol/L (ref 135–145)

## 2018-06-08 LAB — CBC
HCT: 30.6 % — ABNORMAL LOW (ref 36.0–46.0)
Hemoglobin: 10 g/dL — ABNORMAL LOW (ref 12.0–15.0)
MCH: 28.2 pg (ref 26.0–34.0)
MCHC: 32.7 g/dL (ref 30.0–36.0)
MCV: 86.2 fL (ref 80.0–100.0)
Platelets: 285 10*3/uL (ref 150–400)
RBC: 3.55 MIL/uL — ABNORMAL LOW (ref 3.87–5.11)
RDW: 13 % (ref 11.5–15.5)
WBC: 3.7 10*3/uL — ABNORMAL LOW (ref 4.0–10.5)
nRBC: 0 % (ref 0.0–0.2)

## 2018-06-08 LAB — GLUCOSE, CAPILLARY
Glucose-Capillary: 163 mg/dL — ABNORMAL HIGH (ref 70–99)
Glucose-Capillary: 57 mg/dL — ABNORMAL LOW (ref 70–99)
Glucose-Capillary: 152 mg/dL — ABNORMAL HIGH (ref 70–99)
Glucose-Capillary: 168 mg/dL — ABNORMAL HIGH (ref 70–99)
Glucose-Capillary: 83 mg/dL (ref 70–99)
Glucose-Capillary: 153 mg/dL — ABNORMAL HIGH (ref 70–99)
Glucose-Capillary: 146 mg/dL — ABNORMAL HIGH (ref 70–99)

## 2018-06-08 LAB — HEPATIC FUNCTION PANEL
ALT: 12 U/L (ref 0–44)
AST: 17 U/L (ref 15–41)
Albumin: 2.4 g/dL — ABNORMAL LOW (ref 3.5–5.0)
Alkaline Phosphatase: 69 U/L (ref 38–126)
Bilirubin, Direct: 0.1 mg/dL (ref 0.0–0.2)
Indirect Bilirubin: 0.5 mg/dL (ref 0.3–0.9)
Total Bilirubin: 0.6 mg/dL (ref 0.3–1.2)
Total Protein: 5.9 g/dL — ABNORMAL LOW (ref 6.5–8.1)

## 2018-06-08 MED ORDER — POTASSIUM CHLORIDE 10 MEQ/100ML IV SOLN
10.0000 meq | INTRAVENOUS | Status: AC
  Administered 2018-06-08: 10 meq via INTRAVENOUS
  Filled 2018-06-08: qty 100

## 2018-06-08 MED ORDER — LOSARTAN POTASSIUM 25 MG PO TABS
25.0000 mg | ORAL_TABLET | Freq: Once | ORAL | Status: AC
  Administered 2018-06-08: 25 mg via ORAL
  Filled 2018-06-08: qty 1

## 2018-06-08 MED ORDER — LOSARTAN POTASSIUM 50 MG PO TABS
50.0000 mg | ORAL_TABLET | Freq: Every day | ORAL | Status: DC
  Administered 2018-06-09 – 2018-06-10 (×2): 50 mg via ORAL
  Filled 2018-06-08 (×3): qty 1

## 2018-06-08 MED ORDER — DEXTROSE 50 % IV SOLN
1.0000 | Freq: Once | INTRAVENOUS | Status: AC
  Administered 2018-06-08: 50 mL via INTRAVENOUS
  Filled 2018-06-08: qty 50

## 2018-06-08 MED ORDER — PROMETHAZINE HCL 25 MG/ML IJ SOLN
12.5000 mg | Freq: Four times a day (QID) | INTRAMUSCULAR | Status: DC
  Administered 2018-06-08 – 2018-06-10 (×8): 12.5 mg via INTRAVENOUS
  Filled 2018-06-08 (×8): qty 1

## 2018-06-08 MED ORDER — POTASSIUM CHLORIDE 10 MEQ/100ML IV SOLN
10.0000 meq | INTRAVENOUS | Status: AC
  Administered 2018-06-08 (×4): 10 meq via INTRAVENOUS
  Filled 2018-06-08 (×4): qty 100

## 2018-06-08 MED ORDER — SUCRALFATE 1 GM/10ML PO SUSP
1.0000 g | Freq: Four times a day (QID) | ORAL | Status: DC
  Administered 2018-06-08 – 2018-06-10 (×9): 1 g via ORAL
  Filled 2018-06-08 (×9): qty 10

## 2018-06-08 MED ORDER — CAPSAICIN 0.025 % EX CREA
TOPICAL_CREAM | Freq: Two times a day (BID) | CUTANEOUS | Status: DC
  Filled 2018-06-08: qty 60

## 2018-06-08 NOTE — Consult Note (Addendum)
[facility] Consult: 11:52 AM 06/08/2018  LOS: 4 days    Referring Provider: Dr Dennison Nancy.    Primary Care Physician:  Ardith Dark, MD Primary Gastroenterologist:  Dr Adela Lank.  Dismissed from Dr Loreta Ave in early 2019.       Reason for Consultation:  Refractory N/V.     HPI: [name] is a 28 y.o. female.  Hx IDDM with poor control (A1c of 12 in 01/2018).  DKA. Perirectal and gluteal abscess requiring surgical I/D but not Crohns dz.  Anasarca, ascites due to malnutrition.  Cholecystectomy w negative IOC for biliary dyskinesia 10/2011. diagnostic laparoscopy 10/2011, for pneumoperitoneum one week post chole: adhesions but no post op leak or perforation.  Anemia.       Severe gastroparesis x several years with recurrent admissions for N/V.  Mgt complicated by prolonged QTc, precluding tx with Domperidone.  Reglan,  has not helped in past.  Has referral, consideration of gastric pacemaker at [facility] on 06/21/18. 4 tox screens dating to 06/2017 positive for THC.      Chronic loose stools, 3 to 4 a day at baseline.    Abnormal GES in the past c/w gastroparesis Colonoscopy 06/27/2017 - normal colon and ileum- normal random biopsies Flex sig 03/10/2017 - poor prep, normal colon with normal biopsies EGD 12/03/2011 - not completed due to retained food EGD 10/05/2017 - retained gastric fluid which limited some views of body / fundus, otherwise normal, no outlet obstruction CT scan 03/23/2018 - distended stomach, mild transmural thickening of small and large bowel, mild hepatic steatosis CT angio scan 12/23/2017 - mild wall thickening of transverse colon, marked distension of bladder, body wall and mesenteric edema  At latest admission 1 month ago, treated with IV erythromycin.  Able to discharge home.  At GI ROV 2/27 was  having ongoing epigastric pain, constant nausea, episodic vomiting of undigested/partially digested food from previous day.  Meds included combo of Reglan 10 mg po prn, Zofran prn, Phenergan 12.5 mg prn, Pantoprazole 40/day, Bentyl prn.  . Also c/o LE swelling and weight increase 31# over baseline making movement challenging.  Lasix added and swelling has mostly resolved.      Now readmitted with refractory N/V.  Epigastric/lower sternal chest discomfort feels like burning and radiates into her back. She smokes marijuana 3 times a day because it helps with the nausea.  Taking hot showers helps the nausea.  Although she has had the gastroparesis for several years, she said it got much worse after a 2-week hospitalization 02/2017 with DKA, perirectal abscess, depression.    Lactic acidosis has quickly resolved with IVF.  Glucose to 340.  Potassium low, 3.2.  AKI rapidly resolved.  Lipase, LFTs normal.  Albumin low to 2.9.  tox screen + for THC and opiates (received Fentanyl, Morphine).      Day 3 IV erythromycin.  Day 4 Protonix 40 IV BID Prn phenergan PR, scheduled Zofran IV.   Still getting opiates: initially Morphine, Fentanyl, now frequent Dilaudid.      Past Medical History:  Diagnosis  Date  . Allergy   . Anemia   . Anxiety   . Blood transfusion without reported diagnosis    Dec 2018  . Cataract    right eye  . Depression   . Diabetes type 1, uncontrolled (HCC) 11/14/2011   Since age 89  . Fibromyalgia   . Gastroparesis   . GERD (gastroesophageal reflux disease)   . Hypertension   . Infection    UTI April 2016    Past Surgical History:  Procedure Laterality Date  . ANKLE SURGERY    . CHOLECYSTECTOMY  11/15/2011   Procedure: LAPAROSCOPIC CHOLECYSTECTOMY WITH INTRAOPERATIVE CHOLANGIOGRAM;  Surgeon: [name], MD;  Location: [facility];  Service: General;  Laterality: N/A;  . COLONOSCOPY    . COLONOSCOPY WITH PROPOFOL N/A 06/27/2017   Procedure: COLONOSCOPY WITH PROPOFOL;   Surgeon: Rachael Fee, MD;  Location: [facility];  Service: Endoscopy;  Laterality: N/A;  . ESOPHAGOGASTRODUODENOSCOPY  12/03/2011   Procedure: ESOPHAGOGASTRODUODENOSCOPY (EGD);  Surgeon: Theda Belfast, MD;  Location: [facility];  Service: Endoscopy;  Laterality: N/A;  . FLEXIBLE SIGMOIDOSCOPY N/A 03/10/2017   Procedure: FLEXIBLE SIGMOIDOSCOPY;  Surgeon: Jeani Hawking, MD;  Location: [facility];  Service: Endoscopy;  Laterality: N/A;  . INCISION AND DRAINAGE PERIRECTAL ABSCESS N/A 03/01/2017   Procedure: IRRIGATION AND DEBRIDEMENT PERIRECTAL ABSCESS;  Surgeon: Ovidio Kin, MD;  Location: [facility];  Service: General;  Laterality: N/A;  . IRRIGATION AND DEBRIDEMENT BUTTOCKS N/A 03/23/2017   Procedure: IRRIGATION AND DEBRIDEMENT BUTTOCKS, SETON PLACEMENT;  Surgeon: Romie Levee, MD;  Location: [facility];  Service: General;  Laterality: N/A;  . LAPAROSCOPY  11/23/2011   Procedure: LAPAROSCOPY DIAGNOSTIC;  Surgeon: Mariella Saa, MD;  Location: [facility];  Service: General;  Laterality: N/A;  . SIGMOIDOSCOPY    . UPPER GASTROINTESTINAL ENDOSCOPY    . WISDOM TOOTH EXTRACTION      Prior to Admission medications   Medication Sig Start Date End Date Taking? Authorizing Provider  dicyclomine (BENTYL) 20 MG tablet Take 1 tablet (20 mg total) by mouth 3 (three) times daily as needed for spasms (abdominal cramping). 06/03/18   Long, Arlyss Repress, MD  feeding supplement, ENSURE ENLIVE, (ENSURE ENLIVE) LIQD Take 237 mLs by mouth 2 (two) times daily between meals. 05/18/18   Alwyn Ren, MD  furosemide (LASIX) 40 MG tablet Take 1 tablet (40 mg total) by mouth daily for 30 days. Patient taking differently: Take 40 mg by mouth at bedtime.  05/27/18 06/26/18  Unk Lightning, PA  hyoscyamine (LEVSIN SL) 0.125 MG SL tablet Place 1 tablet (0.125 mg total) under the tongue every 6 (six) hours as needed for cramping (abdominal pain). 02/20/18   Burnadette Pop, MD  insulin lispro (HUMALOG) 100  UNIT/ML injection Inject 0-0.09 mLs (0-9 Units total) into the skin 3 (three) times daily with meals. 02/20/18   Burnadette Pop, MD  LANTUS 100 UNIT/ML injection Inject 0.12 mLs (12 Units total) into the skin daily. Patient using Vials 06/03/18   Ardith Dark, MD  LORazepam (ATIVAN) 0.5 MG tablet Take 1 tablet (0.5 mg total) by mouth 2 (two) times daily as needed for anxiety. 05/31/18   Ardith Dark, MD  losartan (COZAAR) 25 MG tablet Take 25 mg by mouth daily.    [provider]  metoCLOPramide (REGLAN) 10 MG tablet Take 1 tablet (10 mg total) by mouth every 6 (six) hours as needed for nausea or vomiting. 02/20/18   Burnadette Pop, MD  mirtazapine (REMERON) 30 MG tablet  Take 1 tablet (30 mg total) by mouth at bedtime. 05/31/18   Ardith Dark, MD  ondansetron (ZOFRAN) 4 MG tablet Take 1 tablet (4 mg total) by mouth every 8 (eight) hours as needed for nausea or vomiting (If necessary, dont combine with Reglan.). 03/25/18   Amin, Loura Halt, MD  pantoprazole (PROTONIX) 40 MG tablet TAKE 1 TABLET BY MOUTH  DAILY Patient taking differently: Take 40 mg by mouth at bedtime.  05/04/18   Armbruster, Willaim Rayas, MD  promethazine (PHENERGAN) 12.5 MG tablet Take 12.5 mg by mouth 2 (two) times daily as needed for nausea/vomiting. 03/11/18   [provider]    Scheduled Meds: . enoxaparin (LOVENOX) injection  40 mg Subcutaneous Q24H  . hyoscyamine  0.125 mg Sublingual Q6H  . insulin aspart  0-9 Units Subcutaneous Q4H  . insulin detemir  5 Units Subcutaneous QHS  . LORazepam  0.25 mg Intravenous BID  . losartan  25 mg Oral Daily  . mirtazapine  30 mg Oral QHS  . ondansetron (ZOFRAN) IV  4 mg Intravenous Q6H  . pantoprazole (PROTONIX) IV  40 mg Intravenous Q12H   Infusions: . dextrose 5 % and 0.9 % NaCl with KCl 20 mEq/L 100 mL/hr at 06/07/18 2341  . erythromycin 175 mg (06/08/18 0606)   PRN Meds: HYDROmorphone (DILAUDID) injection, labetalol, promethazine   Allergies as of  06/04/2018 - Review Complete 06/04/2018  Allergen Reaction Noted  . Other Anaphylaxis 12/13/2011  . Lactose intolerance (gi) Diarrhea 01/22/2015    Family History  Problem Relation Age of Onset  . Diabetes Mother   . Hypertension Father   . Colon cancer Paternal Grandmother        pt thinks PGM was dx in her 59's  . Diabetes Paternal Grandmother   . Diabetes Maternal Grandmother   . Diabetes Maternal Grandfather   . Diabetes Paternal Grandfather   . Diabetes Other   . Esophageal cancer Neg Hx   . Liver cancer Neg Hx   . Pancreatic cancer Neg Hx   . Stomach cancer Neg Hx   . Rectal cancer Neg Hx     Social History   Socioeconomic History  . Marital status: Married    Spouse name: sergio  . Number of children: 0  . Years of education: college  . Highest education level: Not on file  Occupational History  . Occupation: Equities trader call center    Employer: [employer]  Social Needs  . Financial resource strain: Not on file  . Food insecurity:    Worry: Not on file    Inability: Not on file  . Transportation needs:    Medical: Not on file    Non-medical: Not on file  Tobacco Use  . Smoking status: Never Smoker  . Smokeless tobacco: Never Used  Substance and Sexual Activity  . Alcohol use: No    Alcohol/week: 0.0 standard drinks    Frequency: Never  . Drug use: No  . Sexual activity: Not Currently    Partners: Male    Birth control/protection: None  Lifestyle  . Physical activity:    Days per week: Not on file    Minutes per session: Not on file  . Stress: Not on file  Relationships  . Social connections:    Talks on phone: Not on file    Gets together: Not on file    Attends religious service: Not on file    Active member of club or organization: Not on file  Attends meetings of clubs or organizations: Not on file    Relationship status: Not on file  . Intimate partner violence:    Fear of current or ex partner: Not on file    Emotionally  abused: Not on file    Physically abused: Not on file    Forced sexual activity: Not on file  Other Topics Concern  . Not on file  Social History Narrative  . Not on file    REVIEW OF SYSTEMS: Constitutional: Feels tired. ENT:  No nose bleeds Pulm: No trouble breathing.  No cough. CV:  No palpitations, no LE edema.  Lower sternal pain is not related to activity GU:  No hematuria, no frequency GI:  Per HPI Heme:  No excessive bleeding or bruising.     Transfusions:  PRBC x 1 in 02/2017.   Neuro:  No headaches, no peripheral tingling or numbness Derm:  No itching, no rash or sores.  Endocrine:  No sweats or chills.  No polyuria or dysuria Immunization: Reviewed.  She is up-to-date on flu, Pneumovax, Tdap. Travel:  None beyond local counties in last few months.    PHYSICAL EXAM: Vital signs in last 24 hours: Vitals:   06/08/18 0505 06/08/18 0759  BP: (!) 143/93 (!) 169/109  Pulse:  95  Resp:    Temp: 98.6 F (37 C) 98.6 F (37 C)  SpO2: 100% 100%   Wt Readings from Last 3 Encounters:  06/04/18 57.7 kg  06/03/18 58.3 kg  05/31/18 67.7 kg    General: Somewhat tearful, not chronically or acutely ill-appearing AAF.  Resting in bed, appears comfortable. Head: No facial asymmetry or swelling.  No signs of head trauma. Eyes: No scleral icterus.  No conjunctival pallor.  EOMI. Ears: No HOH. Nose: No congestion, no discharge. Mouth: Missing teeth, some dental caries.  Overall compromised dental health.  Oral mucosa moist, pink, clear.  Tongue midline. Neck: No JVD, no masses, no thyromegaly. Lungs: Clear bilaterally.  No labored breathing. Heart: RRR.  No MRG.  S1, S2 present. Abdomen: Soft.  Not tender or distended.  No HSM, masses, bruits, hernias.  Bowel sounds active.  No tinkling or tympanitic bowel sounds..   Rectal: Deferred Musc/Skeltl: No joint redness, swelling or gross deformities. Extremities: Slight, nonpitting, swelling of the left lower  extremity. Neurologic: Oriented x3.  Moves all 4 limbs without tremor, limb strength not tested.  No gross deficits. Skin: No suspicious sores or rashes. Nodes: No cervical adenopathy. Psych: Pleasant, cooperative, tearful at times.  Calm.  Intake/Output from previous day: 03/09 0701 - 03/10 0700 In: 1190.7 [I.V.:990.2; IV Piggyback:200.5] Out: -  Intake/Output this shift: Total I/O In: 300 [I.V.:300] Out: -   LAB RESULTS: Recent Labs    06/07/18 0235 06/08/18 0300  WBC 3.7* 3.7*  HGB 10.9* 10.0*  HCT 33.4* 30.6*  PLT 326 285   BMET Lab Results  Component Value Date   NA 137 06/08/2018   NA 137 06/07/2018   NA 136 06/06/2018   K 3.2 (L) 06/08/2018   K 2.9 (L) 06/07/2018   K 3.2 (L) 06/06/2018   CL 106 06/08/2018   CL 102 06/07/2018   CL 99 06/06/2018   CO2 25 06/08/2018   CO2 27 06/07/2018   CO2 28 06/06/2018   GLUCOSE 137 (H) 06/08/2018   GLUCOSE 149 (H) 06/07/2018   GLUCOSE 91 06/06/2018   BUN 8 06/08/2018   BUN 9 06/07/2018   BUN 13 06/06/2018   CREATININE 0.51 06/08/2018  CREATININE 0.46 06/07/2018   CREATININE 0.56 06/06/2018   CALCIUM 8.1 (L) 06/08/2018   CALCIUM 8.3 (L) 06/07/2018   CALCIUM 8.6 (L) 06/06/2018   LFT No results for input(s): PROT, ALBUMIN, AST, ALT, ALKPHOS, BILITOT, BILIDIR, IBILI in the last 72 hours. PT/INR Lab Results  Component Value Date   INR 1.09 07/10/2017   INR 1.34 02/27/2017   INR 1.14 02/29/2012   Hepatitis Panel No results for input(s): HEPBSAG, HCVAB, HEPAIGM, HEPBIGM in the last 72 hours. C-Diff No components found for: CDIFF Lipase     Component Value Date/Time   LIPASE 19 06/04/2018 1209    Drugs of Abuse     Component Value Date/Time   LABOPIA POSITIVE (A) 06/05/2018 1742   COCAINSCRNUR NONE DETECTED 06/05/2018 1742   LABBENZ NONE DETECTED 06/05/2018 1742   AMPHETMU NONE DETECTED 06/05/2018 1742   THCU POSITIVE (A) 06/05/2018 1742   LABBARB NONE DETECTED 06/05/2018 1742     RADIOLOGY  STUDIES: No results found.  IMPRESSION:   *    Gastroparesis refractory to medical mgt.  There may be element of cannabinoid hyperemesis syndrome.  Not that her nausea is relieved with hot showers which is classic for [facility].    *   Malnutrition in setting of poor po intake, vomiting.      PLAN:      *   Need to wean >> cease opiates.  Not quite sure how to wean these.  If she can keep it down, trial Ultram?     *   Change to scheduled Phergan IV.    Added carafate suspension as I suspect she has esophagitis from all the vomiting.    *   Keep 3/23 appt with GI motility specialist at [facility].   *   Prealbumin level in AM.      Jennye Moccasin  06/08/2018, 11:52 AM Phone [phone]

## 2018-06-08 NOTE — Progress Notes (Signed)
Patient is asking for drink and wants to eat. Nurse asked patient if she will like this writer to page provider to change diet to clear liquid so she can have something to drink since she has not been nauseated or vomitted? Patient told this nurse that she does not want the provider notified because she is concerned they will stop her IV dilaudid, patient said she will prefer to be NPO so she can have her IV dilaudid.

## 2018-06-08 NOTE — Progress Notes (Signed)
PROGRESS NOTE  [name] LNL:[id number] DOB: February 17, 1991 DOA: 06/04/2018 PCP: Kathryn Barrack, MD  HPI/Brief Narrative  [name] is a 28 y.o. year old female with medical history significant for type1 DM, gastroparesis with chronic abdominal pain and multiple admissions for similar symptoms who presented on 06/04/2018 with 2 days of abdominal pain, nausea/vomiting and was found to have acute on chronic abdominal pan presumed secondary to acute flare of gastroparesis  Hospital course: Abdominal x-ray negative Negative pregnancy, with normal limits UA within normal limits lipase. Has been on scheduled IV erythromycin, scheduled Levsin, PRN Phenergan, IV Protonix twice daily, scheduled Zofran since 3/7  Low dose IV Ativan BID scheduled add on 3/10   Assessment/Plan: #Intractable nausea and vomiting, suspect multifactorial etiology (diabetic gastroparesis flare and hyperemesis cannabinoid syndrome) Persistent abdominal pain that improves with hot showers seems most concerning for hyperemesis cannabinoid syndrome; however cannot rule out severe gastroparesis flare related to poor DM control.  On most recent admission in [place] Iv erythromycin and ativan relieved symptoms, started scheduled ativan on 3/9. Maybe this just needs more time? I do suspect pain-seeking behavior however her persistent hypokalemia and intermittent hypoglycemia seems consistent with actual emesis ( there have been reports in the past admissions of her inducing emesis) - Daily EKG, monitor QTC -Continue IV fluids since n.p.o.  -Scheduled erythromycin 175 mg every 6 hours - scheduled levsin 0.125 mg q6 hrs given known gastroparesis,  - suppository phenergan PRN, scheduled Zofran -IV protonix BID, -IV ativan 0.25 BID - GI Consulted  #Acute on chronic abdominal pain, worsening.  Abdominal pain persists, has not been able to advance from n.p.o. status.  Will add IV Ativan as benzos can help with cyclical vomiting  related to hyperemesis cannabinoid syndrome. Nonacute abdomen on exam . -Continue regimen mentioned above -Add IV Ativan 0.25 mg twice daily - scheduled hyoscyamaine - while n.p.o. continue IV as needed Dilaudid, plan to transition to oral pain regimen once able to tolerate diet -No improvement with scheduled benzos will consult GI  #Hypokalemia.  Persistent in the setting of persistent diarrhea. No witnessed vomiting only spitting up. No diarrhea here either. Mag is wnl -D5 normal saline with K, daily BMP -IV potassium repletion -- Scheduled Zofran, PRN suppository Phenergan  #Chronic gastroparesis. May get gastric pacemaker at [facility] ( 3/23 appt). Multiple admissions for flares that tend to improve with IV erythromycin and ativan while monitoring her QTC. Reglan does not work for her  #Marijuana use UDS positive.  Admits to significant amount of marijuana (unable to quantify) daily for the past year to help control her chronic abdominal pain. -Encourage cessation as this could be be a contributing factor to her abdominal pain  #Chronic diarrhea. None here so far. Closely monitor  #T1DM, hyperglycemia without dka. Still NPO due to vomiting and having transient hypoglycemic episodes. 57 on am of 3/10 requiring D50 -On reduced dose lantus (5 units) given N/V and diminished appetite and n.p.o. status.  Last hypoglycemic episodes on 3/8 a.m., SSI, CBG monitoring  #Lower leg edema (none present here), resolved. Evaluated by GI on 2/27 noted to have increased leg swelling and weight gain. Likely related to fluid resuscitation from previous admissions. Was started on lasix 20 mg as outpatient, will not resume here  #Hypertension, not at goal.  SBP's in the 160s.  Likely related to pain.  Increase home losartan, monitor  #History of prolonged QT. Monitor with daily EKG. On multiple prolonging QTC agents    Cultures:  none  Telemetry:no  DVT prophylaxis: lovenox Consultants:  None     Procedures:  none   Antimicrobials:  Code Status: Full Code   Family Communication: none at bedside   Disposition Plan: IV Ativan, IV pro-motility agents, suppository antiemetic PRN, IVF slowly advance diet when abdominal pain improves, pain control, GI consulted      Subjective Still nauseous, persistent abdominal pain better with hot showers Still no desire to eat given belly pain Still reporting vomiting, none at bedside   Objective: Vitals:   06/07/18 1612 06/07/18 1956 06/08/18 0505 06/08/18 0759  BP: (!) 168/106 (!) 178/110 (!) 143/93 (!) 169/109  Pulse: (!) 103 (!) 108  95  Resp: 18 20    Temp: 98 F (36.7 C) 97.6 F (36.4 C) 98.6 F (37 C) 98.6 F (37 C)  TempSrc: Oral Oral Oral Oral  SpO2: 99% 99% 100% 100%  Weight:      Height:        Intake/Output Summary (Last 24 hours) at 06/08/2018 1152 Last data filed at 06/08/2018 1000 Gross per 24 hour  Intake 1490.66 ml  Output -  Net 1490.66 ml   Filed Weights   06/04/18 1740  Weight: 57.7 kg    Exam:  Constitutional: Uncomfortable but in no acute distress Eyes: EOMI, anicteric, normal conjunctivae ENMT: Oropharynx with dry mucous membranes Cardiovascular: RRR no MRGs, with no peripheral edema Respiratory: Normal respiratory effort on room air  Abdomen: Soft, tender with deep palpation on exam particularly in epigastric area, diminished bowel sounds Skin: No rash ulcers, or lesions. Without skin tenting  Neurologic: Grossly no focal neuro deficit. Psychiatric:Appropriate affect, and mood. Mental status AAOx3  Data Reviewed: CBC: Recent Labs  Lab 06/03/18 1226 06/03/18 1236 06/04/18 1209 06/07/18 0235 06/08/18 0300  WBC 4.9  --  3.5* 3.7* 3.7*  NEUTROABS 2.0  --  1.5*  --   --   HGB 9.3* 15.6* 10.1* 10.9* 10.0*  HCT 29.7* 46.0 32.8* 33.4* 30.6*  MCV 88.1  --  89.1 85.6 86.2  PLT 300  --  279 326 622   Basic Metabolic Panel: Recent Labs  Lab 06/04/18 1209 06/05/18 0218  06/06/18 0338 06/07/18 0235 06/08/18 0300  NA 137 137 136 137 137  K 4.2 3.2* 3.2* 2.9* 3.2*  CL 98 102 99 102 106  CO2 27 21* 28 27 25   GLUCOSE 340* 313* 91 149* 137*  BUN 15 12 13 9 8   CREATININE 0.68 0.67 0.56 0.46 0.51  CALCIUM 8.8* 8.8* 8.6* 8.3* 8.1*  MG  --   --  1.9  --   --    GFR: Estimated Creatinine Clearance: 95.4 mL/min (by C-G formula based on SCr of 0.51 mg/dL). Liver Function Tests: Recent Labs  Lab 06/03/18 1226 06/04/18 1209  AST 35 17  ALT 20 17  ALKPHOS 97 85  BILITOT 0.6 0.6  PROT 7.2 6.9  ALBUMIN 2.9* 3.0*   Recent Labs  Lab 06/03/18 1226 06/04/18 1209  LIPASE 36 19   No results for input(s): AMMONIA in the last 168 hours. Coagulation Profile: No results for input(s): INR, PROTIME in the last 168 hours. Cardiac Enzymes: Recent Labs  Lab 06/03/18 1226  CKTOTAL 49   BNP (last 3 results) No results for input(s): PROBNP in the last 8760 hours. HbA1C: No results for input(s): HGBA1C in the last 72 hours. CBG: Recent Labs  Lab 06/08/18 0203 06/08/18 0604 06/08/18 0639 06/08/18 0758 06/08/18 1132  GLUCAP 163* 57* 152* 168* 83  Lipid Profile: No results for input(s): CHOL, HDL, LDLCALC, TRIG, CHOLHDL, LDLDIRECT in the last 72 hours. Thyroid Function Tests: No results for input(s): TSH, T4TOTAL, FREET4, T3FREE, THYROIDAB in the last 72 hours. Anemia Panel: No results for input(s): VITAMINB12, FOLATE, FERRITIN, TIBC, IRON, RETICCTPCT in the last 72 hours. Urine analysis:    Component Value Date/Time   COLORURINE YELLOW 06/04/2018 1209   APPEARANCEUR CLEAR 06/04/2018 1209   LABSPEC 1.020 06/04/2018 1209   PHURINE 7.0 06/04/2018 1209   GLUCOSEU >=500 (A) 06/04/2018 1209   HGBUR SMALL (A) 06/04/2018 1209   BILIRUBINUR NEGATIVE 06/04/2018 1209   BILIRUBINUR negative 01/02/2015 1717   KETONESUR 20 (A) 06/04/2018 1209   PROTEINUR 100 (A) 06/04/2018 1209   UROBILINOGEN 0.2 01/13/2015 0223   NITRITE NEGATIVE 06/04/2018 1209    LEUKOCYTESUR NEGATIVE 06/04/2018 1209   Sepsis Labs: @LABRCNTIP (procalcitonin:4,lacticidven:4)  ) Recent Results (from the past 240 hour(s))  Urine culture     Status: Abnormal   Collection Time: 06/03/18  5:00 PM  Result Value Ref Range Status   Specimen Description URINE, RANDOM  Final   Special Requests   Final    NONE Performed at [facility], [place] [address], [place], [place] [address]    Culture MULTIPLE SPECIES PRESENT, SUGGEST RECOLLECTION (A)  Final   Report Status 06/04/2018 FINAL  Final      Studies: No results found.  Scheduled Meds: . enoxaparin (LOVENOX) injection  40 mg Subcutaneous Q24H  . hyoscyamine  0.125 mg Sublingual Q6H  . insulin aspart  0-9 Units Subcutaneous Q4H  . insulin detemir  5 Units Subcutaneous QHS  . LORazepam  0.25 mg Intravenous BID  . losartan  25 mg Oral Daily  . mirtazapine  30 mg Oral QHS  . ondansetron (ZOFRAN) IV  4 mg Intravenous Q6H  . pantoprazole (PROTONIX) IV  40 mg Intravenous Q12H    Continuous Infusions: . dextrose 5 % and 0.9 % NaCl with KCl 20 mEq/L 100 mL/hr at 06/07/18 2341  . erythromycin 175 mg (06/08/18 0606)     LOS: 4 days     Kathryn Hane, MD [facility]

## 2018-06-08 NOTE — Progress Notes (Signed)
CRITICAL VALUE ALERT  Critical Value:  Blood sugar 57  Date & Time Notied:  06/08/18 0615  Provider Notified: Craige Cotta NP  Orders Received/Actions taken: orders pending

## 2018-06-09 DIAGNOSIS — D649 Anemia, unspecified: Secondary | ICD-10-CM

## 2018-06-09 DIAGNOSIS — R1010 Upper abdominal pain, unspecified: Secondary | ICD-10-CM

## 2018-06-09 LAB — BASIC METABOLIC PANEL
Anion gap: 5 (ref 5–15)
BUN: 6 mg/dL (ref 6–20)
CO2: 24 mmol/L (ref 22–32)
Calcium: 8.3 mg/dL — ABNORMAL LOW (ref 8.9–10.3)
Chloride: 108 mmol/L (ref 98–111)
Creatinine, Ser: 0.6 mg/dL (ref 0.44–1.00)
GFR calc Af Amer: >60 mL/min (ref >60)
GFR calc non Af Amer: >60 mL/min (ref >60)
Glucose, Bld: 205 mg/dL — ABNORMAL HIGH (ref 70–99)
Potassium: 3.6 mmol/L (ref 3.5–5.1)
Sodium: 137 mmol/L (ref 135–145)

## 2018-06-09 LAB — GLUCOSE, CAPILLARY
Glucose-Capillary: 143 mg/dL — ABNORMAL HIGH (ref 70–99)
Glucose-Capillary: 190 mg/dL — ABNORMAL HIGH (ref 70–99)
Glucose-Capillary: 199 mg/dL — ABNORMAL HIGH (ref 70–99)
Glucose-Capillary: 226 mg/dL — ABNORMAL HIGH (ref 70–99)
Glucose-Capillary: 227 mg/dL — ABNORMAL HIGH (ref 70–99)
Glucose-Capillary: 244 mg/dL — ABNORMAL HIGH (ref 70–99)

## 2018-06-09 LAB — CBC
HCT: 31.1 % — ABNORMAL LOW (ref 36.0–46.0)
Hemoglobin: 10.3 g/dL — ABNORMAL LOW (ref 12.0–15.0)
MCH: 28.5 pg (ref 26.0–34.0)
MCHC: 33.1 g/dL (ref 30.0–36.0)
MCV: 85.9 fL (ref 80.0–100.0)
Platelets: 302 10*3/uL (ref 150–400)
RBC: 3.62 MIL/uL — ABNORMAL LOW (ref 3.87–5.11)
RDW: 13 % (ref 11.5–15.5)
WBC: 3.4 10*3/uL — ABNORMAL LOW (ref 4.0–10.5)
nRBC: 0 % (ref 0.0–0.2)

## 2018-06-09 LAB — HEMOGLOBIN A1C
Hgb A1c MFr Bld: 10 % — ABNORMAL HIGH (ref 4.8–5.6)
Mean Plasma Glucose: 240.3 mg/dL

## 2018-06-09 LAB — PREALBUMIN: Prealbumin: 17.9 mg/dL — ABNORMAL LOW (ref 18–38)

## 2018-06-09 MED ORDER — POTASSIUM CHLORIDE 10 MEQ/100ML IV SOLN
10.0000 meq | INTRAVENOUS | Status: AC
  Administered 2018-06-09 (×2): 10 meq via INTRAVENOUS
  Filled 2018-06-09 (×2): qty 100

## 2018-06-09 MED ORDER — HYDROMORPHONE HCL 1 MG/ML IJ SOLN
0.5000 mg | Freq: Four times a day (QID) | INTRAMUSCULAR | Status: DC | PRN
  Administered 2018-06-09 – 2018-06-10 (×5): 0.5 mg via INTRAVENOUS
  Filled 2018-06-09 (×6): qty 1

## 2018-06-09 NOTE — Progress Notes (Signed)
MD paged to clarify potassium order for potassium level this am.

## 2018-06-09 NOTE — Progress Notes (Addendum)
Daily Rounding Note  06/09/2018, 10:06 AM  LOS: 5 days   SUBJECTIVE:   Chief complaint: nausea, vomiting.  epigsttic pain.  Diabetic gastroparesis.       Vomited this AM but better after meds and currently trying to eat sugar-free jello Says she has not yet used capsaicin cream.  This caused burning on her skin in past but she is willing to try again.   Over all feels better.  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      OBJECTIVE:         Vital signs in last 24 hours:    Temp:  [97.7 F (36.5 C)-98.7 F (37.1 C)] 98.7 F (37.1 C) (03/11 0755) Pulse Rate:  [98-102] 102 (03/11 0755) BP: (151-178)/(96-111) 178/111 (03/11 0755) SpO2:  [100 %] 100 % (03/11 0755) Last BM Date: 06/08/18 Filed Weights   06/04/18 1740  Weight: 57.7 kg   General: looks better.       Heart: RRR Chest: clear bil.  No SOB Abdomen: soft, hypoactive BS.  NT.  ND  Derm:  Patches of hyperpigmentation on back, some corresponding with location of spinous processes, pt says this is where she had capsaicin related skin reaction.   Extremities: no CCE Neuro/Psych:  Alert, pleasant, oriented x 3.    Intake/Output from previous day: 03/10 0701 - 03/11 0700 In: 1218.9 [I.V.:718.9; IV Piggyback:500] Out: -   Intake/Output this shift: No intake/output data recorded.  Lab Results: Recent Labs    06/07/18 0235 06/08/18 0300 06/09/18 0244  WBC 3.7* 3.7* 3.4*  HGB 10.9* 10.0* 10.3*  HCT 33.4* 30.6* 31.1*  PLT 326 285 302   BMET Recent Labs    06/07/18 0235 06/08/18 0300 06/09/18 0244  NA 137 137 137  K 2.9* 3.2* 3.6  CL 102 106 108  CO2 27 25 24   GLUCOSE 149* 137* 205*  BUN 9 8 6   CREATININE 0.46 0.51 0.60  CALCIUM 8.3* 8.1* 8.3*   LFT Recent Labs    06/08/18 0300  PROT 5.9*  ALBUMIN 2.4*  AST 17  ALT 12  ALKPHOS 69  BILITOT 0.6  BILIDIR 0.1  IBILI 0.5   Scheduled Meds:  . capsaicin   Topical BID  . enoxaparin (LOVENOX) injection  40 mg Subcutaneous Q24H  . hyoscyamine  0.125 mg Sublingual Q6H  . insulin aspart  0-9 Units Subcutaneous Q4H  . insulin detemir  5 Units Subcutaneous QHS  . LORazepam  0.25 mg Intravenous BID  . losartan  50 mg Oral Daily  . mirtazapine  30 mg Oral QHS  . ondansetron (ZOFRAN) IV  4 mg Intravenous Q6H  . pantoprazole (PROTONIX) IV  40 mg Intravenous Q12H  . promethazine  12.5 mg Intravenous Q6H  . sucralfate  1 g Oral Q6H   Continuous Infusions: . dextrose 5 % and 0.9 % NaCl with KCl 20 mEq/L 100 mL/hr at 06/09/18 0037  . erythromycin 175 mg (06/09/18 0547)  . potassium chloride 10 mEq (06/09/18 0909)   PRN Meds:.HYDROmorphone (DILAUDID) injection, labetalol, promethazine    ASSESMENT:   *   Acute on chronic gastroparesis with N/V in diabetic who smokes a lot of mja.  Hospitalist weaning opiates (only using as inpt).  Scheduled Phenergan, Zofran, BID IV Protonix, QID carafate, scheduled IV erythromycin, scheduled IV ativan, topical capsaicin in place.   Though vomited earlier this AM, feeling better currently.    *  IDDM.  A1c is 10 (mean plasma glucose 240) was 12 in 01/2018.   *   Normocytic anemia.  Chronic.    *    Hypertension.        PLAN   *   Continue current care with progressive opiate weaning.    *   If/when capsaicin applied, may be prudent to leave in place for ~ 1 hour and then wipe off given hx of burns related to this in past.    Addendum at 1:33 PM Call from pt's RN.  Pt is requesting regular diet.   Order for Carb mod diet placed.      Jennye Moccasin  06/09/2018, 10:06 AM Phone [phone]

## 2018-06-09 NOTE — Progress Notes (Signed)
PROGRESS NOTE    [name]  BSJ:[id number] DOB: [birth year] DOA: 06/04/2018 PCP: [name], MD   Brief Narrative:  The patient is a  28 y.o. year old female with medical history significant for Type1 DM along with other comorbidities including but not limited to gastroparesis with chronic abdominal pain and multiple admissions for similar symptoms who presented on 06/04/2018 with 2 days of abdominal pain, nausea/vomiting and was found to have acute on chronic abdominal pan presumed secondary to acute flare of gastroparesis. She was placed on scheduled IV Erythromycin, Scheduled Levsin and Zofran along with Phenergan, and IV Protonix. She was also scheduled on IV Ativan BID on 3/10 and Gastroenterology has been consulted for further evaluation and recommendations and medications are currently being adjusted.   Assessment & Plan:   Principal Problem:   Gastroparesis Active Problems:   Hypertension associated with diabetes ([place])   Diabetes mellitus type 1 (HCC)   GERD (gastroesophageal reflux disease)   Diabetic gastroparesis associated with type 1 diabetes mellitus (HCC)   Cyclical vomiting syndrome   Intractable vomiting  Intractable nausea and vomiting, suspect multifactorial etiology (diabetic gastroparesis flare and hyperemesis cannabinoid syndrome) -Persistent abdominal pain that improves with hot showers seems most concerning for hyperemesis cannabinoid syndrome; however cannot rule out severe gastroparesis flare related to poor DM control.   -On most recent admission in February, IV Erythromycin and ativan relieved symptoms, started scheduled ativan on 3/9. -Maybe this just needs more time? I do suspect some pain-seeking behavior however her persistent hypokalemia and intermittent hypoglycemia seems consistent with actual emesis ( there have been reports in the past admissions of her inducing emesis) -Continue to Monitor daily EKG, monitor QTC -Continue IV fluids since n.p.o.    -Scheduled erythromycin 175 mg every 6 hours -Scheduled levsin 0.125 mg q6 hrs given known gastroparesis,  -On Suppository phenergan PRN but GI added scheduled Promethazine 12.5 mg q6h, scheduled Zofran 4 mg q6h  -C/w IV Pantoprazole 40 mg q12h -C/w IV Ativan 0.25 BID -GI Consulted and recommending Capsaicin Cream to the Abdomen BID and also added Sucralfate 1 gram po q6h -GI recommending Trial of Liquids so started on CLD -Per GI should make efforts for stabilization and eventual care at [facility] for Gastric Stimulator; Per GI no plan for repeat endoscopic evaluation at this time.   Acute on chronic abdominal pain, worsening.   -Abdominal pain persists, has not been able to advance from n.p.o. status but will try CLD today.   -Will add IV Ativan as benzos can help with cyclical vomiting related to hyperemesis cannabinoid syndrome.  -Nonacute abdomen on exam . -Continue regimen mentioned above -Add IV Ativan 0.25 mg twice daily -Scheduled hyoscyamaine 0.125 mg SL q6h -While n.p.o. continue IV as needed Dilaudid (reduced dose from 0.5 mg q4h to q6h), plan to transition to oral pain regimen once able to tolerate diet -GI Consulted for further evaluation and recommendations and recommending continuing IV Erythromcin 175 mg q8h, Scheduled IV Zofran at 4 mg q6h, Scheduled Promethazine 12.5 mg q6h, adding Sucralfate 1 gram po q6h and continuing Hyoscyamine 0.125 mg SL   Hypokalemia.   -Persistent in the setting of persistent diarrhea. No witnessed vomiting only spitting up. No diarrhea here either. Mag is wnl -D5 normal saline with K (20 mEQ) -IV potassium repletion given today at 30 mEQ -K+ was 3.6 this AM and will want a goal of 4.0 -Continue to Monitor and Replete as Necessary -Repeat CMP in AM   Chronic Gastroparesis.  -  May get gastric pacemaker at [facility] (3/23 appt).  -Multiple admissions for flares that tend to improve with IV erythromycin and ativan while monitoring her QTC.  -Reglan  does not work for her -GI consulted and recommending Scheduled Zofran, Scehduled Phenergan, Capsascian, and Carafate 1 gram po q6h  Marijuana Use -UDS positive.  Admits to significant amount of marijuana (unable to quantify) daily for the past year to help control her chronic abdominal pain. -Encouraged cessation as this could be be a contributing factor to her abdominal pain  Chronic Diarrhea.  -None here so far. Closely monitor  T1DM, hyperglycemia without DKA. -Was Still NPO due to vomiting and having transient hypoglycemic episodes. 57 on am of 3/10 requiring D50 but diet now advanced to Clear Liquid Diet at the recommendation of GI -On reduced dose Lantus (5 units) given N/V and diminished appetite and now CLD -HbA1c this visit was 10.0 -C/w Sensitive Novolog SSI q4h  -CBG's ranging from 83-244  Lower leg edema (none present here), resolved.  -Evaluated by GI on 2/27 noted to have increased leg swelling and weight gain.  -Likely related to fluid resuscitation from previous admissions. -Was started on Lasix 20 mg as outpatient, will not resume here currently   Hypertension, not at goal.   -BP this AM was 178/111  -Likely related to pain as well as IVF Hydration -Increased home Losartan to 50 mg po Daily -C/w IV Labetalol 10 mg q4hprn for SBP >180 -Continue to Monitor Vital Signs per protocol   History of prolonged QT -Continue to Monitor with daily EKG. EKG personally reviewed and showed Sinus Tachycardia at a QTc was 439 -On multiple prolonging QTC agents  Normocytic Anemia -Patient's Hb/Hct has been stable and went from 10.0/30.6 -> 10.3/31.1 -Check Anemia Panel in the AM -Continue to Monitor for S/Sx of Bleeding as she is on sq Lovenox -Repeat CBC in AM   DVT prophylaxis: Enoxaparin 40 mg sq q24h Code Status: FULL CODE Family Communication: No family present at bedside Disposition Plan: Pending Further improvement back to baseline  Consultants:   [place]  Gastroenterology  Procedures: None  Antimicrobials:  Anti-infectives (From admission, onward)   Start     Dose/Rate Route Frequency Ordered Stop   06/06/18 1100  erythromycin 175 mg in sodium chloride 0.9 % 100 mL IVPB  Status:  Discontinued     175 mg 100 mL/hr over 60 Minutes Intravenous Every 6 hours 06/06/18 0605 06/06/18 0746   06/06/18 0800  erythromycin 175 mg in sodium chloride 0.9 % 100 mL IVPB     175 mg 100 mL/hr over 60 Minutes Intravenous Every 8 hours 06/06/18 0746     06/05/18 0900  erythromycin 175 mg in sodium chloride 0.9 % 100 mL IVPB     175 mg 100 mL/hr over 60 Minutes Intravenous  Once 06/05/18 0739 06/05/18 1257     Subjective: Patient was seen and examined at bedside and was still having nausea and vomiting along with abdominal pain.  Still was not feeling well and denied any chest pain currently.  No other concerns or complaints at this time.  Objective: Vitals:   06/08/18 0505 06/08/18 0759 06/08/18 1618 06/09/18 0004  BP: (!) 143/93 (!) 169/109 (!) 151/96 (!) 153/102  Pulse:  95 98 98  Resp:      Temp: 98.6 F (37 C) 98.6 F (37 C) 98.2 F (36.8 C) 97.7 F (36.5 C)  TempSrc: Oral Oral Oral Oral  SpO2: 100% 100% 100% 100%  Weight:  Height:        Intake/Output Summary (Last 24 hours) at 06/09/2018 0743 Last data filed at 06/08/2018 1802 Gross per 24 hour  Intake 1218.93 ml  Output -  Net 1218.93 ml   Filed Weights   06/04/18 1740  Weight: 57.7 kg   Examination: Physical Exam:  Constitutional: Thin AAF in NAD and appears calm and but uncomfortable Eyes: Lids and conjunctivae normal, sclerae anicteric  ENMT: External Ears, Nose appear normal. Grossly normal hearing. Mucous membranes are slightly dry Neck: Appears normal, supple, no cervical masses, normal ROM, no appreciable thyromegaly; no JVD Respiratory: Diminished to auscultation bilaterally, no wheezing, rales, rhonchi or crackles. Normal respiratory effort and patient is not  tachypenic. No accessory muscle use.  Cardiovascular: Tachycardic Rate but regular rhythm, no murmurs / rubs / gallops. S1 and S2 auscultated. No extremity edema.  Abdomen: Soft, Tender to palpate, non-distended. No masses palpated. No appreciable hepatosplenomegaly. Bowel sounds positive and slightly hyperactive.  GU: Deferred. Musculoskeletal: No clubbing / cyanosis of digits/nails. No joint deformity upper and lower extremities. Skin: No rashes, lesions, ulcers on a limited skin evaluation. No induration; Warm and dry.  Neurologic: CN 2-12 grossly intact with no focal deficits. Romberg sign and cerebellar reflexes not assessed.  Psychiatric: Normal judgment and insight. Alert and oriented x 3. Slightly anxious mood and appropriate affect.   Data Reviewed: I have personally reviewed following labs and imaging studies  CBC: Recent Labs  Lab 06/03/18 1226 06/03/18 1236 06/04/18 1209 06/07/18 0235 06/08/18 0300 06/09/18 0244  WBC 4.9  --  3.5* 3.7* 3.7* 3.4*  NEUTROABS 2.0  --  1.5*  --   --   --   HGB 9.3* 15.6* 10.1* 10.9* 10.0* 10.3*  HCT 29.7* 46.0 32.8* 33.4* 30.6* 31.1*  MCV 88.1  --  89.1 85.6 86.2 85.9  PLT 300  --  279 326 285 034   Basic Metabolic Panel: Recent Labs  Lab 06/05/18 0218 06/06/18 0338 06/07/18 0235 06/08/18 0300 06/09/18 0244  NA 137 136 137 137 137  K 3.2* 3.2* 2.9* 3.2* 3.6  CL 102 99 102 106 108  CO2 21* 28 27 25 24   GLUCOSE 313* 91 149* 137* 205*  BUN 12 13 9 8 6   CREATININE 0.67 0.56 0.46 0.51 0.60  CALCIUM 8.8* 8.6* 8.3* 8.1* 8.3*  MG  --  1.9  --   --   --    GFR: Estimated Creatinine Clearance: 95.4 mL/min (by C-G formula based on SCr of 0.6 mg/dL). Liver Function Tests: Recent Labs  Lab 06/03/18 1226 06/04/18 1209 06/08/18 0300  AST 35 17 17  ALT 20 17 12   ALKPHOS 97 85 69  BILITOT 0.6 0.6 0.6  PROT 7.2 6.9 5.9*  ALBUMIN 2.9* 3.0* 2.4*   Recent Labs  Lab 06/03/18 1226 06/04/18 1209  LIPASE 36 19   No results for  input(s): AMMONIA in the last 168 hours. Coagulation Profile: No results for input(s): INR, PROTIME in the last 168 hours. Cardiac Enzymes: Recent Labs  Lab 06/03/18 1226  CKTOTAL 49   BNP (last 3 results) No results for input(s): PROBNP in the last 8760 hours. HbA1C: Recent Labs    06/09/18 0244  HGBA1C 10.0*   CBG: Recent Labs  Lab 06/08/18 1132 06/08/18 1617 06/08/18 1952 06/08/18 2356 06/09/18 0537  GLUCAP 83 153* 146* 227* 199*   Lipid Profile: No results for input(s): CHOL, HDL, LDLCALC, TRIG, CHOLHDL, LDLDIRECT in the last 72 hours. Thyroid Function Tests: No results  for input(s): TSH, T4TOTAL, FREET4, T3FREE, THYROIDAB in the last 72 hours. Anemia Panel: No results for input(s): VITAMINB12, FOLATE, FERRITIN, TIBC, IRON, RETICCTPCT in the last 72 hours. Sepsis Labs: Recent Labs  Lab 06/03/18 1226 06/03/18 1734  LATICACIDVEN 3.6* 0.8    Recent Results (from the past 240 hour(s))  Urine culture     Status: Abnormal   Collection Time: 06/03/18  5:00 PM  Result Value Ref Range Status   Specimen Description URINE, RANDOM  Final   Special Requests   Final    NONE Performed at [facility], [place] [address], [place], [place] [address]    Culture MULTIPLE SPECIES PRESENT, SUGGEST RECOLLECTION (A)  Final   Report Status 06/04/2018 FINAL  Final    RN Pressure Injury Documentation:    Estimated body mass index is 19.92 kg/m as calculated from the following:   Height as of this encounter: 5' 7"  (1.702 m).   Weight as of this encounter: 57.7 kg.  Malnutrition Type:      Malnutrition Characteristics:      Nutrition Interventions:     Radiology Studies: No results found.  Scheduled Meds: . capsaicin   Topical BID  . enoxaparin (LOVENOX) injection  40 mg Subcutaneous Q24H  . hyoscyamine  0.125 mg Sublingual Q6H  . insulin aspart  0-9 Units Subcutaneous Q4H  . insulin detemir  5 Units Subcutaneous QHS  . LORazepam  0.25 mg Intravenous BID    . losartan  50 mg Oral Daily  . mirtazapine  30 mg Oral QHS  . ondansetron (ZOFRAN) IV  4 mg Intravenous Q6H  . pantoprazole (PROTONIX) IV  40 mg Intravenous Q12H  . promethazine  12.5 mg Intravenous Q6H  . sucralfate  1 g Oral Q6H   Continuous Infusions: . dextrose 5 % and 0.9 % NaCl with KCl 20 mEq/L 100 mL/hr at 06/09/18 0037  . erythromycin 175 mg (06/09/18 0547)  . potassium chloride      LOS: 5 days   Kerney Elbe, DO [facility] PAGER is on [place]  If 7PM-7AM, please contact night-coverage [email or web address] Password [facility] 06/09/2018, 7:43 AM

## 2018-06-09 NOTE — TOC Initial Note (Signed)
Transition of Care [facility]) - Initial/Assessment Note    Patient Details  Name: [name] MRN: [id number] Date of Birth: Jun 06, 1990  Transition of Care [facility] CM/SW Contact:    Lawerance Sabal, RN Phone Number: 06/09/2018, 2:47 PM  Clinical Narrative:                   [name] w patient at bedside. She states that she feels [name] may be related to her mentrual cycles. She states that she has an OBGYN, CM encouraged her to follow up with her OBGYN after DC. She shared that she also has a difficult time paying for her Lantus. CM placed a benefit check for Levemir, may also need to consider changing to 70/30 if cost prohibit her from taking other long acting insulins.  She shared that she sometimes has difficulty getting rides to appointments. She will sometimes Benedetto Goad or get rides from her inlaws. Patient has [employer], CM asked CSW  About assistance from [facility] for transportation.  CM will continue to follow   Expected Discharge Plan: Home/Self Care Barriers to Discharge: No Barriers Identified   Patient Goals and CMS Choice        Expected Discharge Plan and Services Expected Discharge Plan: Home/Self Care Discharge Planning Services: CM Consult                              Prior Living Arrangements/Services   Lives with:: Spouse                   Activities of Daily Living Home Assistive Devices/Equipment: CBG Meter ADL Screening (condition at time of admission) Patient's cognitive ability adequate to safely complete daily activities?: Yes Is the patient deaf or have difficulty hearing?: No Does the patient have difficulty seeing, even when wearing glasses/contacts?: No Does the patient have difficulty concentrating, remembering, or making decisions?: No Patient able to express need for assistance with ADLs?: Yes Does the patient have difficulty dressing or bathing?: No Independently performs ADLs?: Yes (appropriate for developmental age) Does the patient have  difficulty walking or climbing stairs?: No Weakness of Legs: None Weakness of Arms/Hands: None  Permission Sought/Granted                  Emotional Assessment              Admission diagnosis:  Gastroparesis [K31.84] Dehydration [E86.0] Abdominal pain [R10.9] Intractable vomiting with nausea, unspecified vomiting type [R11.2] Diabetic gastroparesis associated with type 1 diabetes mellitus (HCC) [E10.43, K31.84] Patient Active Problem List   Diagnosis Date Noted  . Intractable vomiting   . Cyclical vomiting syndrome 06/07/2018  . Diabetic gastroparesis associated with type 1 diabetes mellitus (HCC) 06/04/2018  . Anxiety 05/31/2018  . Peripheral edema 05/31/2018  . Normocytic anemia 07/10/2017  . GERD (gastroesophageal reflux disease) 06/23/2017  . 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 Diabetes mellitus type 1 (HCC) 04/28/2013  . Protein-calorie malnutrition, severe (HCC) 04/28/2013  . Hypertension associated with diabetes (HCC) 11/14/2011  . Gastroparesis 11/14/2011   PCP:  Ardith Dark, MD Pharmacy:   [facility] [address] [place] - [address] [place] [address] Phone: [phone] Fax: [phone]  [facility] - [place], [place] - [address] [facility] [address] [place] [address] [place] [address] Phone: [phone] Fax: [phone]     Social Determinants of Health (SDOH)  Interventions    Readmission Risk Interventions 30 Day Unplanned Readmission Risk Score     ED to Hosp-Admission (Current) from 06/04/2018 in [place] [facility] [place] [facility]  30 Day Unplanned Readmission Risk Score (%)  42 Filed at 06/09/2018 1200     This score is the patient's risk of an unplanned readmission within 30 days of being discharged (0 -100%). The score is based on dignosis, age, lab data, medications, orders, and past utilization.   Low:  0-14.9   Medium: 15-21.9    High: 22-29.9   Extreme: 30 and above       Readmission Risk Prevention Plan 06/09/2018  Transportation Screening Complete  Medication Review Ocean[facility]) Complete  PCP or Specialist appointment within 3-5 days of discharge Complete  HRI or Home Care Consult Complete  SW Recovery Care/Counseling Consult Complete  Palliative Care Screening Not Applicable  Skilled Nursing Facility Not Applicable  Some recent data might be hidden

## 2018-06-09 NOTE — TOC Benefit Eligibility Note (Signed)
Transition of Care [facility]) Benefit Eligibility Note    Patient Details  Name: [name] MRN: [id number] Date of Birth: [birth year]   Medication/Dose: LANTUS    Covered?: Yes     Prescription Coverage [facility]: [facility]  Spoke with Person/Company/Phone Number:: TURIYA  @ [employer] # [phone]  Co-Pay: Alvester Morin  Prior Approval: No     Additional Notes: LEVEMIR NON-PREFERRED AND A PLAN EXCLUSION    Mardene Sayer Phone Number: 06/09/2018, 4:35 PM

## 2018-06-10 DIAGNOSIS — K210 Gastro-esophageal reflux disease with esophagitis: Secondary | ICD-10-CM

## 2018-06-10 DIAGNOSIS — R1030 Lower abdominal pain, unspecified: Secondary | ICD-10-CM

## 2018-06-10 LAB — MAGNESIUM: Magnesium: 1.7 mg/dL (ref 1.7–2.4)

## 2018-06-10 LAB — COMPREHENSIVE METABOLIC PANEL
ALT: 13 U/L (ref 0–44)
AST: 18 U/L (ref 15–41)
Albumin: 2.7 g/dL — ABNORMAL LOW (ref 3.5–5.0)
Alkaline Phosphatase: 74 U/L (ref 38–126)
Anion gap: 6 (ref 5–15)
BUN: 9 mg/dL (ref 6–20)
CO2: 21 mmol/L — ABNORMAL LOW (ref 22–32)
Calcium: 8.4 mg/dL — ABNORMAL LOW (ref 8.9–10.3)
Chloride: 110 mmol/L (ref 98–111)
Creatinine, Ser: 0.74 mg/dL (ref 0.44–1.00)
GFR calc Af Amer: >60 mL/min (ref >60)
GFR calc non Af Amer: >60 mL/min (ref >60)
Glucose, Bld: 194 mg/dL — ABNORMAL HIGH (ref 70–99)
Potassium: 3.8 mmol/L (ref 3.5–5.1)
Sodium: 137 mmol/L (ref 135–145)
Total Bilirubin: 0.4 mg/dL (ref 0.3–1.2)
Total Protein: 5.7 g/dL — ABNORMAL LOW (ref 6.5–8.1)

## 2018-06-10 LAB — RETICULOCYTES
Immature Retic Fract: 2.3 % (ref 2.3–15.9)
RBC.: 3.23 MIL/uL — ABNORMAL LOW (ref 3.87–5.11)
Retic Count, Absolute: 63.6 10*3/uL (ref 19.0–186.0)
Retic Ct Pct: 2 % (ref 0.4–3.1)

## 2018-06-10 LAB — GLUCOSE, CAPILLARY
Glucose-Capillary: 106 mg/dL — ABNORMAL HIGH (ref 70–99)
Glucose-Capillary: 167 mg/dL — ABNORMAL HIGH (ref 70–99)
Glucose-Capillary: 198 mg/dL — ABNORMAL HIGH (ref 70–99)

## 2018-06-10 LAB — CBC WITH DIFFERENTIAL/PLATELET
Abs Immature Granulocytes: 0.01 10*3/uL (ref 0.00–0.07)
Basophils Absolute: 0 10*3/uL (ref 0.0–0.1)
Basophils Relative: 1 %
Eosinophils Absolute: 0.2 10*3/uL (ref 0.0–0.5)
Eosinophils Relative: 2 %
HCT: 28.2 % — ABNORMAL LOW (ref 36.0–46.0)
Hemoglobin: 9 g/dL — ABNORMAL LOW (ref 12.0–15.0)
Immature Granulocytes: 0 %
Lymphocytes Relative: 61 %
Lymphs Abs: 3.7 10*3/uL (ref 0.7–4.0)
MCH: 27.9 pg (ref 26.0–34.0)
MCHC: 31.9 g/dL (ref 30.0–36.0)
MCV: 87.3 fL (ref 80.0–100.0)
Monocytes Absolute: 0.5 10*3/uL (ref 0.1–1.0)
Monocytes Relative: 7 %
Neutro Abs: 1.8 10*3/uL (ref 1.7–7.7)
Neutrophils Relative %: 29 %
Platelets: 249 10*3/uL (ref 150–400)
RBC: 3.23 MIL/uL — ABNORMAL LOW (ref 3.87–5.11)
RDW: 13.2 % (ref 11.5–15.5)
WBC: 6.2 10*3/uL (ref 4.0–10.5)
nRBC: 0 % (ref 0.0–0.2)

## 2018-06-10 LAB — IRON AND TIBC
Iron: 42 ug/dL (ref 28–170)
Saturation Ratios: 17 % (ref 10.4–31.8)
TIBC: 252 ug/dL (ref 250–450)
UIBC: 210 ug/dL

## 2018-06-10 LAB — FOLATE: Folate: 23.1 ng/mL (ref >5.9)

## 2018-06-10 LAB — VITAMIN B12: Vitamin B-12: 1171 pg/mL — ABNORMAL HIGH (ref 180–914)

## 2018-06-10 LAB — PHOSPHORUS: Phosphorus: 2.5 mg/dL (ref 2.5–4.6)

## 2018-06-10 LAB — FERRITIN: Ferritin: 89 ng/mL (ref 11–307)

## 2018-06-10 MED ORDER — CAPSAICIN 0.025 % EX CREA: TOPICAL_CREAM | Freq: Two times a day (BID) | CUTANEOUS | 0 refills | Status: DC

## 2018-06-10 MED ORDER — PANTOPRAZOLE SODIUM 40 MG PO TBEC: 40.0000 mg | DELAYED_RELEASE_TABLET | Freq: Two times a day (BID) | ORAL | 0 refills | Status: DC

## 2018-06-10 MED ORDER — LANTUS 100 UNIT/ML ~~LOC~~ SOLN: 6.0000 [IU] | Freq: Every day | SUBCUTANEOUS | 0 refills | Status: DC

## 2018-06-10 MED ORDER — LANTUS 100 UNIT/ML ~~LOC~~ SOLN: 8.0000 [IU] | Freq: Every day | SUBCUTANEOUS | 0 refills | Status: DC

## 2018-06-10 NOTE — TOC Progression Note (Signed)
Transition of Care [facility]) - Progression Note    Patient Details  Name: [name] MRN: [id number] Date of Birth: 1990-07-12  Transition of Care [facility]) CM/SW Contact  Lawerance Sabal, RN Phone Number: 06/10/2018, 10:22 AM  Clinical Narrative:    Patient provided with transportation services through her insurance provider to assist w rides to MD, pharmacy, dentist any medical related destination.     Expected Discharge Plan: Home/Self Care Barriers to Discharge: No Barriers Identified  Expected Discharge Plan and Services Expected Discharge Plan: Home/Self Care Discharge Planning Services: CM Consult Post Acute Care Choice: NA Living arrangements for the past 2 months: Single Family Home                           Social Determinants of Health (SDOH) Interventions    Readmission Risk Interventions 30 Day Unplanned Readmission Risk Score     ED to Hosp-Admission (Current) from 06/04/2018 in [place] [facility] [place] [facility]  30 Day Unplanned Readmission Risk Score (%)  45 Filed at 06/10/2018 0800     This score is the patient's risk of an unplanned readmission within 30 days of being discharged (0 -100%). The score is based on dignosis, age, lab data, medications, orders, and past utilization.   Low:  0-14.9   Medium: 15-21.9   High: 22-29.9   Extreme: 30 and above       Readmission Risk Prevention Plan 06/09/2018  Transportation Screening Complete  Medication Review Oceanographer) Complete  PCP or Specialist appointment within 3-5 days of discharge Complete  HRI or Home Care Consult Complete  SW Recovery Care/Counseling Consult Complete  Palliative Care Screening Not Applicable  Skilled Nursing Facility Not Applicable  Some recent data might be hidden

## 2018-06-10 NOTE — Progress Notes (Signed)
Daily Rounding Note  06/10/2018, 11:44 AM  LOS: 6 days   SUBJECTIVE:   Chief complaint: Gastroparesis. Patient tolerated carbohydrate modified diet last night and this morning.  Had a bowel movement this morning.  Has not had any more nausea overnight and feels pretty good.  No abd pain.    Hospitalist is planning to discharge her today.  OBJECTIVE:         Vital signs in last 24 hours:    Temp:  [98.5 F (36.9 C)-99.3 F (37.4 C)] 98.5 F (36.9 C) (03/12 0822) Pulse Rate:  [94-103] 103 (03/12 0822) BP: (107-160)/(79-111) 160/111 (03/12 0822) SpO2:  [100 %] 100 % (03/12 0822) Last BM Date: 06/08/18 Filed Weights   06/04/18 1740  Weight: 57.7 kg   General: Patient not reexamined as she was sitting on the commode while we conversed.   Intake/Output from previous day: No intake/output data recorded.  Intake/Output this shift: Total I/O In: 400 [I.V.:400] Out: -   Lab Results: Recent Labs    06/08/18 0300 06/09/18 0244 06/10/18 0223  WBC 3.7* 3.4* 6.2  HGB 10.0* 10.3* 9.0*  HCT 30.6* 31.1* 28.2*  PLT 285 302 249   BMET Recent Labs    06/08/18 0300 06/09/18 0244 06/10/18 0223  NA 137 137 137  K 3.2* 3.6 3.8  CL 106 108 110  CO2 25 24 21*  GLUCOSE 137* 205* 194*  BUN 8 6 9   CREATININE 0.51 0.60 0.74  CALCIUM 8.1* 8.3* 8.4*   LFT Recent Labs    06/08/18 0300 06/10/18 0223  PROT 5.9* 5.7*  ALBUMIN 2.4* 2.7*  AST 17 18  ALT 12 13  ALKPHOS 69 74  BILITOT 0.6 0.4  BILIDIR 0.1  --   IBILI 0.5  --    Scheduled Meds: . capsaicin   Topical BID  . enoxaparin (LOVENOX) injection  40 mg Subcutaneous Q24H  . hyoscyamine  0.125 mg Sublingual Q6H  . insulin aspart  0-9 Units Subcutaneous Q4H  . insulin detemir  5 Units Subcutaneous QHS  . LORazepam  0.25 mg Intravenous BID  . losartan  50 mg Oral Daily  . mirtazapine  30 mg Oral QHS  . ondansetron (ZOFRAN) IV  4 mg Intravenous Q6H  .  pantoprazole (PROTONIX) IV  40 mg Intravenous Q12H  . promethazine  12.5 mg Intravenous Q6H  . sucralfate  1 g Oral Q6H   Continuous Infusions: . dextrose 5 % and 0.9 % NaCl with KCl 20 mEq/L 100 mL/hr at 06/10/18 0545  . erythromycin 175 mg (06/10/18 0547)   PRN Meds:.HYDROmorphone (DILAUDID) injection, labetalol, promethazine   ASSESMENT:   *   Recurrent, acute flare of diabetic gastroparesis.  Symptoms improved, tolerating solid food.  *     IDDM.  Not well controlled.   PLAN   *   Patient will discharge home today.  She should resume all of her previous anti-emetics and PPI addition of 4 times daily Carafate and topical capsaicin. I told her that if she has recurrent flare of her gastroparesis that she ought to try presenting to the ED at [facility] because that way she could be evaluated by gastric motility specialists as an inpatient.  She has an appointment to see these specialists on 06/21/2018. She is welcome to return to [facility] or [facility] hospital but the specialists she needs to see are only available at [facility].       Jennye Moccasin  06/10/2018, 11:44 AM Phone [phone]

## 2018-06-10 NOTE — Discharge Summary (Signed)
Physician Discharge Summary  [name] ZOX:[id number] DOB: 1990-08-02 DOA: 06/04/2018  PCP: [name], MD  Admit date: 06/04/2018 Discharge date: 06/10/2018  Admitted From: Home Disposition: Home  Recommendations for Outpatient Follow-up:  1. Follow up with PCP in 1-2 weeks  2. Follow up with Gastroenterology in 1-2 weeks 3. Follow up with Gastric Specailists at [facility] on 3/23 for Gastric Pacer evaluation 4. Please obtain CMP/CBC, Mag, Phos in one week 5. Please follow up on the following pending results:  Home Health: No Equipment/Devices: None  Discharge Condition: Stable CODE STATUS: FULL CODE Diet recommendation: Carb Modified Diet  Brief/Interim Summary: The patient is a 28 y.o.year old femalewith medical history significant for Type1 DM along with other comorbidities including but not limited to gastroparesis with chronic abdominal pain and multiple admissions for similar symptoms who presented on 3/6/2020with 2 days of abdominal pain, nausea/vomiting and was found to have acute on chronic abdominal pan presumed secondary to acute flare of gastroparesis. She was placed on scheduled IV Erythromycin, Scheduled Levsin and Zofran along with Phenergan, and IV Protonix. She was also scheduled on IV Ativan BID on 3/10 and Gastroenterology has been consulted for further evaluation and recommendations and medications are currently being adjusted.   Improved and was able to tolerate diet and she was deemed medically stable to be discharged.  She is to resume all her antiemetics at home along with PPI twice daily as well as capsaicin and Carafate and she is to follow-up with [facility] gastric specialist for a gastric pacemaker on 06/21/2018.   Discharge Diagnoses:  Principal Problem:   Gastroparesis Active Problems:   Abdominal pain   Hypertension associated with diabetes ([place])   Diabetes mellitus type 1 (HCC)   GERD (gastroesophageal reflux disease)   Diabetic  gastroparesis associated with type 1 diabetes mellitus (HCC)   Cyclical vomiting syndrome   Intractable vomiting  Intractable nausea and vomiting, suspect multifactorial etiology (diabetic gastroparesis flare and hyperemesis cannabinoid syndrome), improved  -Persistent abdominal pain that improves with hot showers seems most concerning for hyperemesis cannabinoid syndrome; however cannot rule out severe gastroparesis flare related to poor DM control.  -On most recent admission in February, IV Erythromycin and ativan relieved symptoms, started scheduled ativan on 3/9. -Maybe this just needs more time? I do suspect some pain-seeking behavior however her persistent hypokalemia and intermittent hypoglycemia seems consistent with actual emesis ( there have been reports in the past admissions of her inducing emesis) -Continue to Monitor daily EKG, monitor QTC -Continued IV fluids since n.p.o but tried Clear Liquids and then advanced Diet to Carb Modified  -Scheduled erythromycin 175 mg every 6 hours -Scheduled levsin 0.125 mg q6 hrs given known gastroparesis,  -On Suppository phenergan PRN but GI added scheduled Promethazine 12.5 mg q6h, scheduled Zofran 4 mg q6h  -C/w IV Pantoprazole 40 mg q12h -C/w IV Ativan 0.25 BID -GI Consulted and recommending Capsaicin Cream to the Abdomen BID and also added Sucralfate 1 gram po q6h -GI recommending going back on home antiemetics along with PPI and both Carafate and capsaicin.  She should follow-up with [facility] specialists -Per GI should make efforts for stabilization and eventual care at [facility] for Gastric Stimulator; Per GI no plan for repeat endoscopic evaluation at this time.  -Follow up with GI in outpatient setting  Acute on chronic abdominal pain, proved-Abdominal pain persists, has not been able to advance from n.p.o. status but will try CLD today.  -Will add IV Ativan as benzos can  help with cyclical vomiting related to hyperemesis cannabinoid  syndrome.  -Nonacute abdomen on exam . -Continue regimen mentioned above -Add IV Ativan 0.25 mg twice daily -Scheduled hyoscyamaine 0.125 mg SL q6h -While n.p.o. continue IV as needed Dilaudid (reduced dose from 0.5 mg q4h to q6h), plan to transition to oral pain regimen once able to tolerate diet -GI Consulted for further evaluation and recommendations and recommending continuing IV Erythromcin 175 mg q8h, Scheduled IV Zofran at 4 mg q6h, Scheduled Promethazine 12.5 mg q6h, adding Sucralfate 1 gram po q6h and continuing Hyoscyamine 0.125 mg SL  she was hospitalized and she will go back on all her home regimen at discharge  Hypokalemia.  -Persistent in the setting of persistent diarrhea. No witnessed vomiting only spitting up. No diarrhea here either. Mag is wnl -D5 normal saline with K (20 mEQ) -IV potassium repletion given today at 30 mEQ -K+ was 3.8 this AM and will want a goal of 4.0 -Continue to Monitor and Replete as Necessary -Repeat CMP in AM   Chronic Gastroparesis.  -May get gastric pacemaker at [facility] (3/23 appt).  -Multiple admissions for flares that tend to improve with IV erythromycin and ativan while monitoring her QTC.  -Reglan does not work for her -GI consulted and recommending Scheduled Zofran, Scehduled Phenergan, Capsascian, and Carafate 1 gram po q6h she was hospitalized and resuming home antiemetics along with Carafate 1 g every 6 along with capsaicin and PPI twice daily  Marijuana Use -UDS positive. Admits to significant amount of marijuana (unable to quantify) daily for the past year to help control her chronic abdominal pain. -Encouraged cessation as this could be be a contributing factor to her abdominal pain  Chronic Diarrhea.  -None here so far. Closely monitor  T1DM, hyperglycemia without DKA. -Was Still NPO due to vomiting and having transient hypoglycemic episodes. 57 on am of 3/10 requiring D50 but diet now advanced to Clear Liquid Diet at the  recommendation of GI -On reduced dose Lantus (8 units) given N/V and diminished appetite and now Carb Modified Die -HbA1c this visit was 10.0 -C/w Sensitive Novolog SSI q4h  -CBG's ranging from 106-226 -We will discharge home on 8 units of Lantus as patient cannot afford Lantus she has no co-pay and will continue her home sliding scale  Lower leg edema.  -Evaluated by GI on 2/27 noted to have increased leg swelling and weight gain.  -Likely related to fluid resuscitation from previous admissions has now some here.  -Was started on Lasix 40 mg as outpatient and will resume at discharge  Hypertension, not at goal.  -BP this AM was 160/111 -Likely related to pain as well as IVF Hydration -Increased home Losartan to 50 mg po Daily -C/w IV Labetalol 10 mg q4hprn for SBP >180 -Continue to Monitor Vital Signs per protocol  -Follow-up with PCP for further blood pressure medication adjustmentHistory of prolonged QT -Continue to Monitor with daily EKG. EKG personally reviewed and showed Sinus Tachycardia at a QTc was 437 -On multiple prolonging QTC agents  Normocytic Anemia -Patient's Hb/Hct has been stable and went from 10.0/30.6 -> 10.3/31.1 -> 9.0/28.2 -Checked Anemia Panel and showed an iron level 42, U IBC of 210, TIBC of 252, saturation ratios of 17%, ferritin level of 89, folate level of 23.1, and vitamin B12 level of 1171 -Continue to Monitor for S/Sx of Bleeding as she is on sq Lovenox -Repeat CBC as an outpatient   Discharge Instructions  Discharge Instructions    Call MD for:  difficulty breathing, headache or visual disturbances   Complete by:  As directed    Call MD for:  extreme fatigue   Complete by:  As directed    Call MD for:  hives   Complete by:  As directed    Call MD for:  persistant dizziness or light-headedness   Complete by:  As directed    Call MD for:  persistant nausea and vomiting   Complete by:  As directed    Call MD for:  redness, tenderness, or  signs of infection (pain, swelling, redness, odor or green/yellow discharge around incision site)   Complete by:  As directed    Call MD for:  severe uncontrolled pain   Complete by:  As directed    Call MD for:  temperature >100.4   Complete by:  As directed    Diet - low sodium heart healthy   Complete by:  As directed    Diet Carb Modified   Complete by:  As directed    Discharge instructions   Complete by:  As directed    You were cared for by a hospitalist during your hospital stay. If you have any questions about your discharge medications or the care you received while you were in the hospital after you are discharged, you can call the unit and ask to speak with the hospitalist on call if the hospitalist that took care of you is not available. Once you are discharged, your primary care physician will handle any further medical issues. Please note that NO REFILLS for any discharge medications will be authorized once you are discharged, as it is imperative that you return to your primary care physician (or establish a relationship with a primary care physician if you do not have one) for your aftercare needs so that they can reassess your need for medications and monitor your lab values.  Follow up with PCP and Gastroenterology. Follow up at [facility] for Designer, [employer]. Take all medications as prescribed. If symptoms change or worsen please return to the ED for evaluation   Increase activity slowly   Complete by:  As directed      Allergies as of 06/10/2018      Reactions   Other Anaphylaxis   Reaction to [place] nuts   Lactose Intolerance (gi) Diarrhea      Medication List    TAKE these medications   capsaicin 0.025 % cream Commonly known as:  ZOSTRIX Apply topically 2 (two) times daily.   dicyclomine 20 MG tablet Commonly known as:  BENTYL Take 1 tablet (20 mg total) by mouth 3 (three) times daily as needed for spasms (abdominal cramping).   feeding supplement  (ENSURE ENLIVE) Liqd Take 237 mLs by mouth 2 (two) times daily between meals.   furosemide 40 MG tablet Commonly known as:  Lasix Take 1 tablet (40 mg total) by mouth daily for 30 days. What changed:  when to take this   hyoscyamine 0.125 MG SL tablet Commonly known as:  LEVSIN SL Place 1 tablet (0.125 mg total) under the tongue every 6 (six) hours as needed for cramping (abdominal pain).   insulin lispro 100 UNIT/ML injection Commonly known as:  HUMALOG Inject 0-0.09 mLs (0-9 Units total) into the skin 3 (three) times daily with meals.   Lantus 100 UNIT/ML injection Generic drug:  insulin glargine Inject 0.08 mLs (8 Units total) into the skin at bedtime. Patient using Vials What changed:    how much to take  when to take this   LORazepam 0.5 MG tablet Commonly known as:  Ativan Take 1 tablet (0.5 mg total) by mouth 2 (two) times daily as needed for anxiety.   losartan 25 MG tablet Commonly known as:  COZAAR Take 25 mg by mouth daily.   metoCLOPramide 10 MG tablet Commonly known as:  REGLAN Take 1 tablet (10 mg total) by mouth every 6 (six) hours as needed for nausea or vomiting.   mirtazapine 30 MG tablet Commonly known as:  Remeron Take 1 tablet (30 mg total) by mouth at bedtime.   ondansetron 4 MG tablet Commonly known as:  Zofran Take 1 tablet (4 mg total) by mouth every 8 (eight) hours as needed for nausea or vomiting (If necessary, dont combine with Reglan.).   pantoprazole 40 MG tablet Commonly known as:  PROTONIX Take 1 tablet (40 mg total) by mouth 2 (two) times daily. What changed:  when to take this   promethazine 12.5 MG tablet Commonly known as:  PHENERGAN Take 12.5 mg by mouth 2 (two) times daily as needed for nausea/vomiting.       Allergies  Allergen Reactions  . Other Anaphylaxis    Reaction to [place] nuts   . Lactose Intolerance (Gi) Diarrhea    Consultations:  Gastroenterology  Procedures/Studies: Dg Abd 2 Views  Result Date:  06/04/2018 CLINICAL DATA:  Nausea vomiting and diarrhea. EXAM: ABDOMEN - 2 VIEW COMPARISON:  None. FINDINGS: The bowel gas pattern is normal. There is no evidence of free air. No radio-opaque calculi or other significant radiographic abnormality is seen. IMPRESSION: Negative. Electronically Signed   By: Fidela Salisbury M.D.   On: 06/04/2018 15:09   Dg Abd Acute 2+v W 1v Chest  Result Date: 05/12/2018 CLINICAL DATA:  Abdominal pain and vomiting EXAM: DG ABDOMEN ACUTE W/ 1V CHEST COMPARISON:  03/26/2018 FINDINGS: Cardiac shadow is within normal limits. The lungs are well aerated bilaterally. No focal infiltrate or sizable effusion is seen. Scattered large and small bowel gas is noted. No free air is seen. No abnormal mass or abnormal calcifications are noted. No acute bony abnormality is seen. IMPRESSION: No acute abnormality noted. Electronically Signed   By: Inez Catalina M.D.   On: 05/12/2018 00:02     Subjective: And examined at bedside was doing well.  Denied any chest pain, lightheadedness or dizziness.  States that she had some pain in her abdomen but is likely from cramping from her.  And her abdominal pain from vomiting had improved.  No nausea and she felt well.  Deemed stable to be discharged at this time and will go home and follow-up with [facility] specialist on 06/21/2018  Discharge Exam: Vitals:   06/09/18 2255 06/10/18 0822  BP: 128/90 (!) 160/111  Pulse: 94 (!) 103  Resp:    Temp: 98.6 F (37 C) 98.5 F (36.9 C)  SpO2: 100% 100%   Vitals:   06/09/18 0755 06/09/18 1628 06/09/18 2255 06/10/18 0822  BP: (!) 178/111 107/79 128/90 (!) 160/111  Pulse: (!) 102 98 94 (!) 103  Resp:      Temp: 98.7 F (37.1 C) 99.3 F (37.4 C) 98.6 F (37 C) 98.5 F (36.9 C)  TempSrc:  Oral Oral Oral  SpO2: 100% 100% 100% 100%  Weight:      Height:       General: Pt is alert, awake, not in acute distress Cardiovascular: RRR, S1/S2 +, no rubs, no gallops Respiratory: Diminished bilaterally,  no wheezing, no rhonchi Abdominal:  Soft, NT, ND, bowel sounds + Extremities: 1+ LE edema, no cyanosis  The results of significant diagnostics from this hospitalization (including imaging, microbiology, ancillary and laboratory) are listed below for reference.    Microbiology: Recent Results (from the past 240 hour(s))  Urine culture     Status: Abnormal   Collection Time: 06/03/18  5:00 PM  Result Value Ref Range Status   Specimen Description URINE, RANDOM  Final   Special Requests   Final    NONE Performed at [facility], [address], [place], [place] [address]    Culture MULTIPLE SPECIES PRESENT, SUGGEST RECOLLECTION (A)  Final   Report Status 06/04/2018 FINAL  Final    Labs: BNP (last 3 results) No results for input(s): BNP in the last 8760 hours. Basic Metabolic Panel: Recent Labs  Lab 06/06/18 0338 06/07/18 0235 06/08/18 0300 06/09/18 0244 06/10/18 0223  NA 136 137 137 137 137  K 3.2* 2.9* 3.2* 3.6 3.8  CL 99 102 106 108 110  CO2 28 27 25 24  21*  GLUCOSE 91 149* 137* 205* 194*  BUN 13 9 8 6 9   CREATININE 0.56 0.46 0.51 0.60 0.74  CALCIUM 8.6* 8.3* 8.1* 8.3* 8.4*  MG 1.9  --   --   --  1.7  PHOS  --   --   --   --  2.5   Liver Function Tests: Recent Labs  Lab 06/04/18 1209 06/08/18 0300 06/10/18 0223  AST 17 17 18   ALT 17 12 13   ALKPHOS 85 69 74  BILITOT 0.6 0.6 0.4  PROT 6.9 5.9* 5.7*  ALBUMIN 3.0* 2.4* 2.7*   Recent Labs  Lab 06/04/18 1209  LIPASE 19   No results for input(s): AMMONIA in the last 168 hours. CBC: Recent Labs  Lab 06/04/18 1209 06/07/18 0235 06/08/18 0300 06/09/18 0244 06/10/18 0223  WBC 3.5* 3.7* 3.7* 3.4* 6.2  NEUTROABS 1.5*  --   --   --  1.8  HGB 10.1* 10.9* 10.0* 10.3* 9.0*  HCT 32.8* 33.4* 30.6* 31.1* 28.2*  MCV 89.1 85.6 86.2 85.9 87.3  PLT 279 326 285 302 249   Cardiac Enzymes: No results for input(s): CKTOTAL, CKMB, CKMBINDEX, TROPONINI in the last 168 hours. BNP: Invalid input(s):  POCBNP CBG: Recent Labs  Lab 06/09/18 1625 06/09/18 1929 06/10/18 0032 06/10/18 0536 06/10/18 0822  GLUCAP 143* 226* 198* 167* 106*   D-Dimer No results for input(s): DDIMER in the last 72 hours. Hgb A1c Recent Labs    06/09/18 0244  HGBA1C 10.0*   Lipid Profile No results for input(s): CHOL, HDL, LDLCALC, TRIG, CHOLHDL, LDLDIRECT in the last 72 hours. Thyroid function studies No results for input(s): TSH, T4TOTAL, T3FREE, THYROIDAB in the last 72 hours.  Invalid input(s): FREET3 Anemia work up Recent Labs    06/10/18 0223  VITAMINB12 1,171*  FOLATE 23.1  FERRITIN 89  TIBC 252  IRON 42  RETICCTPCT 2.0   Urinalysis    Component Value Date/Time   COLORURINE YELLOW 06/04/2018 1209   APPEARANCEUR CLEAR 06/04/2018 1209   LABSPEC 1.020 06/04/2018 1209   PHURINE 7.0 06/04/2018 1209   GLUCOSEU >=500 (A) 06/04/2018 1209   HGBUR SMALL (A) 06/04/2018 1209   BILIRUBINUR NEGATIVE 06/04/2018 1209   BILIRUBINUR negative 01/02/2015 1717   KETONESUR 20 (A) 06/04/2018 1209   PROTEINUR 100 (A) 06/04/2018 1209   UROBILINOGEN 0.2 01/13/2015 0223   NITRITE NEGATIVE 06/04/2018 1209   LEUKOCYTESUR NEGATIVE 06/04/2018 1209   Sepsis Labs Invalid  input(s): PROCALCITONIN,  WBC,  LACTICIDVEN Microbiology Recent Results (from the past 240 hour(s))  Urine culture     Status: Abnormal   Collection Time: 06/03/18  5:00 PM  Result Value Ref Range Status   Specimen Description URINE, RANDOM  Final   Special Requests   Final    NONE Performed at [facility], [address], [place], [place] [address]    Culture MULTIPLE SPECIES PRESENT, SUGGEST RECOLLECTION (A)  Final   Report Status 06/04/2018 FINAL  Final   Time coordinating discharge: 35 minutes  SIGNED:  Kerney Elbe, DO [facility] 06/10/2018, 5:39 PM Pager is on [place]  If 7PM-7AM, please contact night-coverage [email or web address] Password [email or web address]

## 2018-06-10 NOTE — Progress Notes (Addendum)
Inpatient Diabetes Program Recommendations  AACE/ADA: New Consensus Statement on Inpatient Glycemic Control (2015)  Target Ranges:  Prepandial:   less than 140 mg/dL      Peak postprandial:   less than 180 mg/dL (1-2 hours)      Critically ill patients:  140 - 180 mg/dL   Lab Results  Component Value Date   GLUCAP 106 (H) 06/10/2018   HGBA1C 10.0 (H) 06/09/2018    Review of Glycemic Control Results for Kathryn, [name]" (MRN [id number]) as of 06/10/2018 10:21  Ref. Range 06/09/2018 19:29 06/10/2018 00:32 06/10/2018 05:36 06/10/2018 08:22  Glucose-Capillary Latest Ref Range: 70 - 99 mg/dL 947 (H) 096 (H) 283 (H) 106 (H)   Diabetes history: Type 1 DM Outpatient Diabetes medications: Humalog 0-9 units TID, Lantus 12 units QD Current orders for Inpatient glycemic control: Novolog 0-9 units Q4H, Lantus 5 units QHS  Inpatient Diabetes Program Recommendations:    Consider increasing Lantus to 8 units QD.   Given patient's insurance coverage, would recommend discharging patient home on Lantus instead of Levemir due to $0 dollar co-pay.  Thanks, Lujean Rave, MSN, RNC-OB Diabetes Coordinator [phone] (8a-5p)

## 2018-06-11 ENCOUNTER — Telehealth: Payer: Self-pay

## 2018-06-11 NOTE — Telephone Encounter (Signed)
Per Chart Review  Admit date: 06/04/2018 Discharge date: 06/10/2018  Admitted From: Home Disposition: Home  Recommendations for Outpatient Follow-up:  1. Follow up with PCP in 1-2 weeks  2. Follow up with Gastroenterology in 1-2 weeks 3. Follow up with Gastric Specailists at [facility] on 3/23 for Gastric Pacer evaluation 4. Please obtain CMP/CBC, Mag, Phos in one week 5. Please follow up on the following pending results:  Home Health: No Equipment/Devices: None  Discharge Condition: Stable CODE STATUS: FULL CODE Diet recommendation: Carb Modified Diet ____________________________________  Per Telephone Call  Transition Care Management Follow-up Telephone Call   Date discharged? 06/10/2018   How have you been since you were released from the hospital? Patient stated she has been doing well since being released.   Do you understand why you were in the hospital? yes   Do you understand the discharge instructions? yes   Where were you discharged to? Home   Items Reviewed:  Medications reviewed: yes  Allergies reviewed: yes  Dietary changes reviewed: yes  Referrals reviewed: yes   Functional Questionnaire:   Activities of Daily Living (ADLs):   She states they are independent in the following: ambulation, bathing and hygiene, feeding, continence, grooming, toileting and dressing States they require assistance with the following: None   Any transportation issues/concerns?: no   Any patient concerns? no   Confirmed importance and date/time of follow-up visits scheduled yes  Provider Appointment booked with Dr.Parker on 06/22/18 @ 3:40 PM  Confirmed with patient if condition begins to worsen call PCP or go to the ER.  Patient was given the office number and encouraged to call back with question or concerns.  : yes

## 2018-06-15 ENCOUNTER — Telehealth: Payer: Self-pay | Admitting: Family Medicine

## 2018-06-15 NOTE — Telephone Encounter (Signed)
See note  Copied from [place] [phone]. Topic: General - Inquiry >> Jun 15, 2018  2:38 PM Scherrie Gerlach wrote: Reason for CRM: pt states she would like the form she dropped off yesterday faxed to [facility] FaX: [phone]  Not pick up as she had originally requested

## 2018-06-17 ENCOUNTER — Telehealth: Payer: Self-pay

## 2018-06-17 ENCOUNTER — Other Ambulatory Visit: Payer: Self-pay

## 2018-06-17 NOTE — Telephone Encounter (Signed)
rec'd request from [employer] for recent OV and labs for member in relation to an Corporate investment banker and Wellness program.  Faxed records to Chauncey Fischer, RN at [phone].

## 2018-06-18 ENCOUNTER — Other Ambulatory Visit: Payer: Self-pay

## 2018-06-18 ENCOUNTER — Ambulatory Visit (INDEPENDENT_AMBULATORY_CARE_PROVIDER_SITE_OTHER): Payer: 59 | Admitting: Endocrinology

## 2018-06-18 ENCOUNTER — Encounter: Payer: Self-pay | Admitting: Endocrinology

## 2018-06-18 VITALS — BP 144/82 | HR 113 | Ht 67.0 in | Wt 142.2 lb

## 2018-06-18 DIAGNOSIS — E1069 Type 1 diabetes mellitus with other specified complication: Secondary | ICD-10-CM

## 2018-06-18 MED ORDER — LANTUS 100 UNIT/ML ~~LOC~~ SOLN: 5.0000 [IU] | Freq: Every day | SUBCUTANEOUS | 3 refills | Status: DC

## 2018-06-18 MED ORDER — INSULIN LISPRO 100 UNIT/ML ~~LOC~~ SOLN: 4.0000 [IU] | Freq: Three times a day (TID) | SUBCUTANEOUS | 0 refills | Status: DC

## 2018-06-18 MED ORDER — FREESTYLE LIBRE 14 DAY SENSOR MISC: 1.0000 | 3 refills | Status: DC

## 2018-06-18 MED ORDER — LANTUS 100 UNIT/ML ~~LOC~~ SOLN: 5.0000 [IU] | Freq: Every day | SUBCUTANEOUS | 0 refills | Status: DC

## 2018-06-18 NOTE — Progress Notes (Signed)
Subjective:    Patient ID: [name], female    DOB: Sep 11, 1990, 28 y.o.   MRN: [id number]  HPI Pt returns for f/u of diabetes mellitus: DM type: 1 Dx'ed: 2002 Complications: gastroparesis Therapy: insulin since dx GDM: never DKA: last episode was in 2018 Severe hypoglycemia: last episode was in 2018.   Pancreatitis: never Pancreatic imaging: normal on 2019 CT Other: she takes multiple daily injections Interval history: she was recently in the hospital again, with vomiting.  She feels better now.  She takes lantus 8 units qhs, and ss-novolog (averages 2-4 units 3 times a day (just before each meal).  no cbg record, but states cbg's vary from 60-250.  It is in general higher as the day goes on.   Past Medical History:  Diagnosis Date  . Allergy   . Anemia   . Anxiety   . Blood transfusion without reported diagnosis    Dec 2018  . Cataract    right eye  . Depression   . Diabetes type 1, uncontrolled (HCC) 11/14/2011   Since age 81  . Fibromyalgia   . Gastroparesis   . GERD (gastroesophageal reflux disease)   . Hypertension   . Infection    UTI April 2016    Past Surgical History:  Procedure Laterality Date  . ANKLE SURGERY    . CHOLECYSTECTOMY  11/15/2011   Procedure: LAPAROSCOPIC CHOLECYSTECTOMY WITH INTRAOPERATIVE CHOLANGIOGRAM;  Surgeon: [name], MD;  [facility]: [facility];  Service: General;  Laterality: N/A;  . COLONOSCOPY    . COLONOSCOPY WITH PROPOFOL N/A 06/27/2017   Procedure: COLONOSCOPY WITH PROPOFOL;  Surgeon: Rachael Fee, MD;  [facility];  Service: Endoscopy;  Laterality: N/A;  . ESOPHAGOGASTRODUODENOSCOPY  12/03/2011   Procedure: ESOPHAGOGASTRODUODENOSCOPY (EGD);  Surgeon: Theda Belfast, MD;  Location: [facility];  Service: Endoscopy;  Laterality: N/A;  . FLEXIBLE SIGMOIDOSCOPY N/A 03/10/2017   Procedure: FLEXIBLE SIGMOIDOSCOPY;  Surgeon: Jeani Hawking, MD;  [facility];  Service: Endoscopy;  Laterality: N/A;  . INCISION  AND DRAINAGE PERIRECTAL ABSCESS N/A 03/01/2017   Procedure: IRRIGATION AND DEBRIDEMENT PERIRECTAL ABSCESS;  Surgeon: Ovidio Kin, MD;  [facility];  Service: General;  Laterality: N/A;  . IRRIGATION AND DEBRIDEMENT BUTTOCKS N/A 03/23/2017   Procedure: IRRIGATION AND DEBRIDEMENT BUTTOCKS, SETON PLACEMENT;  Surgeon: Romie Levee, MD;  [facility];  Service: General;  Laterality: N/A;  . LAPAROSCOPY  11/23/2011   Procedure: LAPAROSCOPY DIAGNOSTIC;  Surgeon: Mariella Saa, MD;  [facility];  Service: General;  Laterality: N/A;  . SIGMOIDOSCOPY    . UPPER GASTROINTESTINAL ENDOSCOPY    . WISDOM TOOTH EXTRACTION      Social History   Socioeconomic History  . Marital status: Married    Spouse name: sergio  . Number of children: 0  . Years of education: college  . Highest education level: Not on file  Occupational History  . Occupation: Equities trader call center    Employer: [employer]  Social Needs  . Financial resource strain: Not on file  . Food insecurity:    Worry: Not on file    Inability: Not on file  . Transportation needs:    Medical: Not on file    Non-medical: Not on file  Tobacco Use  . Smoking status: Never Smoker  . Smokeless tobacco: Never Used  Substance and Sexual Activity  . Alcohol use: No    Alcohol/week: 0.0 standard drinks    Frequency: Never  . Drug use:  No  . Sexual activity: Not Currently    Partners: Male    Birth control/protection: None  Lifestyle  . Physical activity:    Days per week: Not on file    Minutes per session: Not on file  . Stress: Not on file  Relationships  . Social connections:    Talks on phone: Not on file    Gets together: Not on file    Attends religious service: Not on file    Active member of club or organization: Not on file    Attends meetings of clubs or organizations: Not on file    Relationship status: Not on file  . Intimate partner violence:    Fear of current or ex partner: Not on  file    Emotionally abused: Not on file    Physically abused: Not on file    Forced sexual activity: Not on file  Other Topics Concern  . Not on file  Social History Narrative  . Not on file    Current Outpatient Medications on File Prior to Visit  Medication Sig Dispense Refill  . capsaicin (ZOSTRIX) 0.025 % cream Apply topically 2 (two) times daily. 60 g 0  . dicyclomine (BENTYL) 20 MG tablet Take 1 tablet (20 mg total) by mouth 3 (three) times daily as needed for spasms (abdominal cramping). 20 tablet 0  . feeding supplement, ENSURE ENLIVE, (ENSURE ENLIVE) LIQD Take 237 mLs by mouth 2 (two) times daily between meals. 237 mL 12  . furosemide (LASIX) 40 MG tablet Take 1 tablet (40 mg total) by mouth daily for 30 days. (Patient taking differently: Take 40 mg by mouth at bedtime. ) 30 tablet 3  . hyoscyamine (LEVSIN SL) 0.125 MG SL tablet Place 1 tablet (0.125 mg total) under the tongue every 6 (six) hours as needed for cramping (abdominal pain). 30 tablet 0  . LORazepam (ATIVAN) 0.5 MG tablet Take 1 tablet (0.5 mg total) by mouth 2 (two) times daily as needed for anxiety. 30 tablet 2  . losartan (COZAAR) 25 MG tablet Take 25 mg by mouth daily.    . metoCLOPramide (REGLAN) 10 MG tablet Take 1 tablet (10 mg total) by mouth every 6 (six) hours as needed for nausea or vomiting. 20 tablet 0  . mirtazapine (REMERON) 30 MG tablet Take 1 tablet (30 mg total) by mouth at bedtime. 30 tablet 5  . ondansetron (ZOFRAN) 4 MG tablet Take 1 tablet (4 mg total) by mouth every 8 (eight) hours as needed for nausea or vomiting (If necessary, dont combine with Reglan.). 20 tablet 0  . pantoprazole (PROTONIX) 40 MG tablet Take 1 tablet (40 mg total) by mouth 2 (two) times daily. 60 tablet 0  . promethazine (PHENERGAN) 12.5 MG tablet Take 12.5 mg by mouth 2 (two) times daily as needed for nausea/vomiting.     No current facility-administered medications on file prior to visit.     Allergies  Allergen Reactions   . Other Anaphylaxis    Reaction to [place] nuts   . Lactose Intolerance (Gi) Diarrhea    Family History  Problem Relation Age of Onset  . Diabetes Mother   . Hypertension Father   . Colon cancer Paternal Grandmother        pt thinks PGM was dx in her 34's  . Diabetes Paternal Grandmother   . Diabetes Maternal Grandmother   . Diabetes Maternal Grandfather   . Diabetes Paternal Grandfather   . Diabetes Other   . Esophageal cancer  Neg Hx   . Liver cancer Neg Hx   . Pancreatic cancer Neg Hx   . Stomach cancer Neg Hx   . Rectal cancer Neg Hx     BP (!) 144/82 (BP Location: Left Arm, Patient Position: Sitting, Cuff Size: Normal)   Pulse (!) 113   Ht 5\' 7"  (1.702 m)   Wt 142 lb 3.2 oz (64.5 kg)   SpO2 97%   BMI 22.27 kg/m   Review of Systems Denies LOC    Objective:   Physical Exam VITAL SIGNS:  See vs page GENERAL: no distress Pulses: dorsalis pedis intact bilat.   MSK: no deformity of the feet CV: 2+ bilat leg edema Skin:  no ulcer on the feet.  normal color and temp on the feet. Neuro: sensation is intact to touch on the feet.    Lab Results  Component Value Date   HGBA1C 10.0 (H) 06/09/2018   Lab Results  Component Value Date   CREATININE 0.74 06/10/2018   BUN 9 06/10/2018   NA 137 06/10/2018   K 3.8 06/10/2018   CL 110 06/10/2018   CO2 21 (L) 06/10/2018       Assessment & Plan:  Type 1 DM, with gastroparesis: ongoing poor glycemic control.  we discussed changing to qd insulin.  She wants to continue multiple daily injections. Edema: residual from hospitalization.  I told pt this will probably improve with time.  N/v, improved.  We discussed the need to take humalog partway through the meal.   HTN: is noted today.   Patient Instructions  Your blood pressure is high today.  Please see your primary care provider soon, to have it rechecked. Please take humalog 4 units 3 times a day (with each meal), no matter what your blood sugar is, and: Reduce the  lantus to 5 units at bedtime. I have sent a prescription to your pharmacy, for the [facility] sensors.  Please come back for a follow-up appointment in 1-2 weeks

## 2018-06-18 NOTE — Patient Instructions (Addendum)
Your blood pressure is high today.  Please see your primary care provider soon, to have it rechecked. Please take humalog 4 units 3 times a day (with each meal), no matter what your blood sugar is, and: Reduce the lantus to 5 units at bedtime. I have sent a prescription to your pharmacy, for the [facility] sensors.  Please come back for a follow-up appointment in 1-2 weeks

## 2018-06-21 DIAGNOSIS — R110 Nausea: Secondary | ICD-10-CM | POA: Diagnosis not present

## 2018-06-21 DIAGNOSIS — R197 Diarrhea, unspecified: Secondary | ICD-10-CM | POA: Diagnosis not present

## 2018-06-21 DIAGNOSIS — K3184 Gastroparesis: Secondary | ICD-10-CM | POA: Diagnosis not present

## 2018-06-21 DIAGNOSIS — E109 Type 1 diabetes mellitus without complications: Secondary | ICD-10-CM | POA: Diagnosis not present

## 2018-06-22 ENCOUNTER — Encounter: Payer: Self-pay | Admitting: Family Medicine

## 2018-06-22 ENCOUNTER — Ambulatory Visit (INDEPENDENT_AMBULATORY_CARE_PROVIDER_SITE_OTHER): Payer: 59 | Admitting: Family Medicine

## 2018-06-22 ENCOUNTER — Other Ambulatory Visit: Payer: Self-pay | Admitting: Gastroenterology

## 2018-06-22 DIAGNOSIS — F330 Major depressive disorder, recurrent, mild: Secondary | ICD-10-CM | POA: Diagnosis not present

## 2018-06-22 DIAGNOSIS — I10 Essential (primary) hypertension: Secondary | ICD-10-CM

## 2018-06-22 DIAGNOSIS — K3184 Gastroparesis: Secondary | ICD-10-CM

## 2018-06-22 DIAGNOSIS — I152 Hypertension secondary to endocrine disorders: Secondary | ICD-10-CM

## 2018-06-22 DIAGNOSIS — F419 Anxiety disorder, unspecified: Secondary | ICD-10-CM

## 2018-06-22 DIAGNOSIS — E1159 Type 2 diabetes mellitus with other circulatory complications: Secondary | ICD-10-CM

## 2018-06-22 MED ORDER — LOSARTAN POTASSIUM 25 MG PO TABS: 25.0000 mg | ORAL_TABLET | Freq: Every day | ORAL | 3 refills | Status: DC

## 2018-06-22 MED ORDER — FREESTYLE LIBRE 14 DAY SENSOR MISC: 1.0000 | 3 refills | Status: DC

## 2018-06-22 NOTE — Assessment & Plan Note (Signed)
Continue Remeron 30 mg daily and Ativan 0.5 mg twice daily.

## 2018-06-22 NOTE — Assessment & Plan Note (Signed)
Blood pressure has been in range during her last office visit and at home.  Continue losartan 25 mg daily.  She is cleared to have dental extraction if her blood pressure continues to stay well controlled.

## 2018-06-22 NOTE — Telephone Encounter (Signed)
Yes can refill

## 2018-06-22 NOTE — Assessment & Plan Note (Signed)
Stable. Continue management per GI.  

## 2018-06-22 NOTE — Progress Notes (Signed)
    Chief Complaint:  [name] is a 28 y.o. female who presents today for a virtual office visit with a chief complaint of depression/anxiety follow up.   Assessment/Plan:  MDD (major depressive disorder), recurrent episode, mild (HCC) Doing much better.  Continue Remeron 30 mg.  Follow up in 3-6 months.   Hypertension associated with diabetes (HCC) Blood pressure has been in range during her last office visit and at home.  Continue losartan 25 mg daily.  She is cleared to have dental extraction if her blood pressure continues to stay well controlled.  Gastroparesis Stable.  Continue management per GI.  Anxiety Continue Remeron 30 mg daily and Ativan 0.5 mg twice daily.    Subjective:  HPI:  Her stable, chronic medical conditions are outlined below:  # Depression/anxiety - On Remeron 30mg  nightly and ativan 0.5mg  twice dailya nd doing well. - ROS: No reported SI or HI   #Gastroparesis  -Currently follows with GI and is undergoing evaluation at [facility] for possible GI pacemaker -She currently takes Reglan, Zofran, and Phenergan as needed for nausea.  She also takes hyoscyamine as needed for abdominal cramping. -She is on twice daily ativan which seems to be helping  # Essential Hypertension -On losartan 25 mg daily and tolerating well. -ROS: No reported chest pain or shortness of breath  ROS: Per HPI  PMH: She reports that she has never smoked. She has never used smokeless tobacco. She reports that she does not drink alcohol or use drugs.      Objective/Observations  Physical Exam: Pulm: Normal work of breathing Psych: Normal affect and thought content  Virtual Visit via Video   I connected with SEREN WINEMILLER on 06/22/18 at  3:40 PM EDT by a video enabled telemedicine application and verified that I am speaking with the correct person using two identifiers. I discussed the limitations of evaluation and management by telemedicine and the availability of in  person appointments. The patient expressed understanding and agreed to proceed.   Patient location: Home Provider location: [place] [employer] Persons participating in the virtual visit: Myself and patient     Katina Degree. Jimmey Ralph, MD 06/22/2018 4:48 PM

## 2018-06-22 NOTE — Assessment & Plan Note (Signed)
Doing much better.  Continue Remeron 30 mg.  Follow up in 3-6 months.

## 2018-06-22 NOTE — Telephone Encounter (Signed)
Form has been faxed.

## 2018-06-22 NOTE — Telephone Encounter (Signed)
Pt saw Dr. Lanell Matar at [facility] yesterday;  Note in CareEverywhere.  Would you like to refill her Phenergan?

## 2018-06-24 ENCOUNTER — Telehealth: Payer: Self-pay

## 2018-06-24 ENCOUNTER — Other Ambulatory Visit: Payer: Self-pay

## 2018-06-24 MED ORDER — FUROSEMIDE 40 MG PO TABS: 40.0000 mg | ORAL_TABLET | Freq: Every day | ORAL | 0 refills | Status: DC

## 2018-06-24 NOTE — Telephone Encounter (Signed)
Discussed with you already. Ok to refil #30 and then she needs a telemedicine visit. Thanks-JLL

## 2018-06-24 NOTE — Telephone Encounter (Signed)
Received refill request for Furosemide from [facility]  Home delivery.  Per Victorino Dike okay to refill number 30 and needs an office visit. Prescription sent. Appt made for next week.

## 2018-06-30 ENCOUNTER — Other Ambulatory Visit: Payer: Self-pay

## 2018-06-30 ENCOUNTER — Ambulatory Visit (INDEPENDENT_AMBULATORY_CARE_PROVIDER_SITE_OTHER): Payer: 59 | Admitting: Gastroenterology

## 2018-06-30 DIAGNOSIS — K3184 Gastroparesis: Secondary | ICD-10-CM

## 2018-06-30 DIAGNOSIS — K219 Gastro-esophageal reflux disease without esophagitis: Secondary | ICD-10-CM

## 2018-06-30 DIAGNOSIS — K529 Noninfective gastroenteritis and colitis, unspecified: Secondary | ICD-10-CM | POA: Diagnosis not present

## 2018-06-30 DIAGNOSIS — E1069 Type 1 diabetes mellitus with other specified complication: Secondary | ICD-10-CM

## 2018-06-30 NOTE — Progress Notes (Signed)
Virtual Visit via Video Note  I connected with [name] on 51/88/41 at  4:00 PM EDT by a video enabled telemedicine application and verified that I am speaking with the correct person using two identifiers.   I discussed the limitations of evaluation and management by telemedicine and the availability of in person appointments. The patient expressed understanding and agreed to proceed.  THIS ENCOUNTER IS A VIRTUAL VISIT DUE TO COVID-19 - PATIENT WAS NOT SEEN IN THE OFFICE. PATIENT HAS CONSENTED TO VIRTUAL VISIT / TELEMEDICINE VISIT   Location of patient: home Location of provider: office Name of referring provider:  Persons participating: myself, patient Time spent on call:  20  Minutes total with additional time spent reviewing chart and updating record.   HPI :  28 y/o female here for a follow up visit. She has seen me in the past for gastroparesis and chronic diarrhea. She has longstanding type I diabetes. She's had normal EGDs and prior abnormal gastric emptying studies (last was 3 years ago). Not much benefit from Reglan. History of prolonged QT periodically which precluded use of domperidone. She has chronic diarrhea thus use of prucalapride has been avoided. She has been maintained on Remeron 42m, phenergan 12.546mTID, and protonix 4055mID. This regimen has helped in general but she has had some severe episodes leading to hospitalizations earlier this year. Last admitted in early March. She was given IV erythromycin and ativan when admitted which has helped. I referred her to [facility] for gastric pacemaker. They think she is a good candidate for it, and wanted to repeat her gastric emptying prior to proceeding. During her hospitalization she had a low albumin level and developed edema. She has been placed on lasix 29m78mday which has helped resolve the edema. Mostly gone at this point, but she continues lasix. We had asked her to do a repeat BMET  but that is pending. She is tolerating liquid diet. She had her teeth pulled since I have seen her due to damage from reflux, adjusting her diet. She is drinking protein shakes and tolerating them. Since her hospitalization her regimen has been working fairly well for her. She has 1-2 episodes of vomiting per week. Previously she had multiple episodes per day. She has seen endocrinology, made some adjustments to her regimen and glucose levels seem a bit better controlled.   She otherwise continues to have chronic loose stools. Anywhere from one to 2 loose stools today, on a severe day has upwards of 10 stools a day, not bad recently. Again not using Reglan due to severe diarrhea with its use. I previously had ordered pancreatic fecal elastase however she was not able to submit that sample. She's had prior CT scans showing some inflammation of her colon. Prior colonoscopy for this issue with Dr. JacoArdis HughsMarch was normal. Biopsies showed no evidence of microscopic colitis and there was no evidence of Crohn's disease. She does have a history of perianal/perirectal abscess. That had been treated and she has not had any recent issues with that. She has been on empiric trials of colestyramine and Creon in the past, neither of which helped.   Prior evaluation: Abnormal GES in the past c/w gastroparesis Colonoscopy 06/27/2017 - normal colon and ileum- normal random biopsies Flex sig 03/10/2017 - poor prep, normal colon with normal biopsies EGD 12/03/2011 - not completed due to retained food EGD 10/05/2017 - retained gastric fluid which limited some views of  body / fundus, otherwise normal, no outlet obstruction  CT scan 03/23/2018 - distended stomach, mild transmural thickening of small and large bowel, mild hepatic steatosis CT angio scan 12/23/2017 - mild wall thickening of transverse colon, marked distension of bladder, body wall and mesenteric edema       Past Medical History:  Diagnosis Date    Allergy    Anemia    Anxiety    Blood transfusion without reported diagnosis    Dec 2018   Cataract    right eye   Depression    Diabetes type 1, uncontrolled ([place]) 11/14/2011   Since age 37   Fibromyalgia    Gastroparesis    GERD (gastroesophageal reflux disease)    Hypertension    Infection    UTI April 2016     Past Surgical History:  Procedure Laterality Date   ANKLE SURGERY     CHOLECYSTECTOMY  11/15/2011   Procedure: LAPAROSCOPIC CHOLECYSTECTOMY WITH INTRAOPERATIVE CHOLANGIOGRAM;  Surgeon: [name], MD;  [facility]: [facility];  Service: General;  Laterality: N/A;   COLONOSCOPY     COLONOSCOPY WITH PROPOFOL N/A 06/27/2017   Procedure: COLONOSCOPY WITH PROPOFOL;  Surgeon: Milus Banister, MD;  [facility];  Service: Endoscopy;  Laterality: N/A;   ESOPHAGOGASTRODUODENOSCOPY  12/03/2011   Procedure: ESOPHAGOGASTRODUODENOSCOPY (EGD);  Surgeon: Beryle Beams, MD;  Location: [facility];  Service: Endoscopy;  Laterality: N/A;   FLEXIBLE SIGMOIDOSCOPY N/A 03/10/2017   Procedure: FLEXIBLE SIGMOIDOSCOPY;  Surgeon: Carol Ada, MD;  [facility]: [facility];  Service: Endoscopy;  Laterality: N/A;   INCISION AND DRAINAGE PERIRECTAL ABSCESS N/A 03/01/2017   Procedure: IRRIGATION AND DEBRIDEMENT PERIRECTAL ABSCESS;  Surgeon: Alphonsa Overall, MD;  [facility]: [facility];  Service: General;  Laterality: N/A;   IRRIGATION AND DEBRIDEMENT BUTTOCKS N/A 03/23/2017   Procedure: IRRIGATION AND DEBRIDEMENT BUTTOCKS, SETON PLACEMENT;  Surgeon: [name], MD;  [facility]: [facility];  Service: General;  Laterality: N/A;   LAPAROSCOPY  11/23/2011   Procedure: LAPAROSCOPY DIAGNOSTIC;  Surgeon: Edward Jolly, MD;  Location: [facility];  Service: General;  Laterality: N/A;   SIGMOIDOSCOPY     UPPER GASTROINTESTINAL ENDOSCOPY     WISDOM TOOTH EXTRACTION     Family History  Problem Relation Age of Onset   Diabetes Mother    Hypertension Father    Colon cancer Paternal  Grandmother        pt thinks PGM was dx in her 78's   Diabetes Paternal Grandmother    Diabetes Maternal Grandmother    Diabetes Maternal Grandfather    Diabetes Paternal Grandfather    Diabetes Other    Esophageal cancer Neg Hx    Liver cancer Neg Hx    Pancreatic cancer Neg Hx    Stomach cancer Neg Hx    Rectal cancer Neg Hx    Social History   Tobacco Use   Smoking status: Never Smoker   Smokeless tobacco: Never Used  Substance Use Topics   Alcohol use: No    Alcohol/week: 0.0 standard drinks    Frequency: Never   Drug use: No   Current Outpatient Medications  Medication Sig Dispense Refill   capsaicin (ZOSTRIX) 0.025 % cream Apply topically 2 (two) times daily. 60 g 0   Continuous Blood Gluc Sensor (FREESTYLE LIBRE 14 DAY SENSOR) MISC 1 Device by Does not apply route every 14 (fourteen) days. 6 each 3   dicyclomine (BENTYL) 20 MG tablet Take 1 tablet (20 mg total) by mouth 3 (three) times daily  as needed for spasms (abdominal cramping). 20 tablet 0   feeding supplement, ENSURE ENLIVE, (ENSURE ENLIVE) LIQD Take 237 mLs by mouth 2 (two) times daily between meals. 237 mL 12   furosemide (LASIX) 40 MG tablet Take 1 tablet (40 mg total) by mouth daily for 30 days. 30 tablet 0   hyoscyamine (LEVSIN SL) 0.125 MG SL tablet Place 1 tablet (0.125 mg total) under the tongue every 6 (six) hours as needed for cramping (abdominal pain). 30 tablet 0   insulin lispro (HUMALOG) 100 UNIT/ML injection Inject 0.04 mLs (4 Units total) into the skin 3 (three) times daily with meals. 10 mL 0   LANTUS 100 UNIT/ML injection Inject 0.05 mLs (5 Units total) into the skin at bedtime. Patient using Vials 30 mL 3   LORazepam (ATIVAN) 0.5 MG tablet Take 1 tablet (0.5 mg total) by mouth 2 (two) times daily as needed for anxiety. 30 tablet 2   losartan (COZAAR) 25 MG tablet Take 1 tablet (25 mg total) by mouth daily. 90 tablet 3   metoCLOPramide (REGLAN) 10 MG tablet Take 1 tablet (10  mg total) by mouth every 6 (six) hours as needed for nausea or vomiting. 20 tablet 0   mirtazapine (REMERON) 30 MG tablet Take 1 tablet (30 mg total) by mouth at bedtime. 30 tablet 5   ondansetron (ZOFRAN) 4 MG tablet Take 1 tablet (4 mg total) by mouth every 8 (eight) hours as needed for nausea or vomiting (If necessary, dont combine with Reglan.). 20 tablet 0   pantoprazole (PROTONIX) 40 MG tablet Take 1 tablet (40 mg total) by mouth 2 (two) times daily. 60 tablet 0   promethazine (PHENERGAN) 12.5 MG tablet TAKE 1 TABLET BY MOUTH  EVERY 8 HOURS AS NEEDED FOR NAUSEA AND VOMITING 90 tablet 0   No current facility-administered medications for this visit.    Allergies  Allergen Reactions   Other Anaphylaxis    Reaction to [place] nuts    Lactose Intolerance (Gi) Diarrhea     Review of Systems: All systems reviewed and negative except where noted in HPI.   Lab Results  Component Value Date   ALT 13 06/10/2018   AST 18 06/10/2018   ALKPHOS 74 06/10/2018   BILITOT 0.4 06/10/2018    Lab Results  Component Value Date   CREATININE 0.74 06/10/2018   BUN 9 06/10/2018   NA 137 06/10/2018   K 3.8 06/10/2018   CL 110 06/10/2018   CO2 21 (L) 06/10/2018     Physical Exam: NA  ASSESSMENT AND PLAN:  28 y/o female here for reassessment of the following issues:  Severe gastroparesis / type I DM / GERD - refractory gastroparesis leading to multiple admissions and has precipitated DKA. On Remeron, phenergen TID, and protonix BID. Reglan does not seem to help and worsens diarrhea, had not been a candidate for domperidone given prolonged QT in the past. Seen by [facility] recently for assessment for her candidacy for gastric pacemaker, they feel she is a good candidate if GES shows gastroparesis which it has in the past, repeat exam pending in a few weeks. Continue regimen for now. I asked her to go to the lab for CMET - want to make sure renal function / electrolytes are stable on lasix and  that albumin is improved. Will wean down lasix if possible. She agreed. Continue regimen for now.   Chronic diarrhea - not a candidate for prucalopride due to this. Colonoscopy without abnormalities and biopsies normal. Infectious  workup negative. Have ordered fecal elastase a few times, this remains pending, will ask her to submit if she can, and check TTg, IgA, otherwise continue immodium.  Crosby Cellar, MD [facility]

## 2018-07-17 ENCOUNTER — Other Ambulatory Visit: Payer: Self-pay | Admitting: Physician Assistant

## 2018-07-17 ENCOUNTER — Other Ambulatory Visit: Payer: Self-pay | Admitting: Gastroenterology

## 2018-07-17 DIAGNOSIS — K3184 Gastroparesis: Secondary | ICD-10-CM

## 2018-07-17 DIAGNOSIS — R600 Localized edema: Secondary | ICD-10-CM

## 2018-07-19 NOTE — Telephone Encounter (Signed)
Okay that's fine, we can refill it now if she has been taking it, will await lab. Thanks Jan

## 2018-07-19 NOTE — Telephone Encounter (Signed)
Jan can you clarify with the patient if she is taking this every day, or how frequently she is using it? We can refill it she is using it daily, but she is due for BMET to ensure stable renal function if you can ask her to go to the lab. Thanks

## 2018-07-19 NOTE — Telephone Encounter (Signed)
Spoke to pt.  She has been taking 40mg  daily.  She will go to the lab this week for BMET.  When we get results back she would prefer a 90 day script to [facility]

## 2018-07-19 NOTE — Telephone Encounter (Signed)
OK to refill?  Staying at 40mg  daily? Or reducing?  Please advise. Thanks

## 2018-07-20 ENCOUNTER — Other Ambulatory Visit: Payer: Self-pay

## 2018-07-20 MED ORDER — FUROSEMIDE 40 MG PO TABS: 40.0000 mg | ORAL_TABLET | Freq: Every day | ORAL | 1 refills | Status: DC

## 2018-07-26 ENCOUNTER — Telehealth: Payer: Self-pay | Admitting: Gastroenterology

## 2018-07-26 NOTE — Telephone Encounter (Signed)
Pt requested refill for phenergan suppositories sent to [facility] on [address].

## 2018-07-27 MED ORDER — PROMETHAZINE HCL 12.5 MG RE SUPP: 12.5000 mg | Freq: Three times a day (TID) | RECTAL | 2 refills | Status: DC | PRN

## 2018-07-27 NOTE — Telephone Encounter (Signed)
Pt requesting Phenergan suppositories. Please advise. Thanks.

## 2018-07-27 NOTE — Telephone Encounter (Signed)
Script for phenergan supp sent to [facility]

## 2018-07-27 NOTE — Telephone Encounter (Signed)
Yes we can refill. She should not be taking both oral formulation and suppository at the same time, I would only use PR if she cannot tolerate oral, or if it works better for her. Thanks

## 2018-07-29 ENCOUNTER — Inpatient Hospital Stay (HOSPITAL_COMMUNITY)
Admission: EM | Admit: 2018-07-29 | Discharge: 2018-08-03 | DRG: 637 | Disposition: A | Discharge status: Home / Self Care | Payer: 59 | Attending: Internal Medicine | Admitting: Internal Medicine

## 2018-07-29 ENCOUNTER — Encounter (HOSPITAL_COMMUNITY): Payer: Self-pay | Admitting: Emergency Medicine

## 2018-07-29 ENCOUNTER — Other Ambulatory Visit: Payer: Self-pay

## 2018-07-29 ENCOUNTER — Emergency Department (HOSPITAL_COMMUNITY): Payer: 59

## 2018-07-29 DIAGNOSIS — R1115 Cyclical vomiting syndrome unrelated to migraine: Secondary | ICD-10-CM | POA: Diagnosis present

## 2018-07-29 DIAGNOSIS — F419 Anxiety disorder, unspecified: Secondary | ICD-10-CM | POA: Diagnosis present

## 2018-07-29 DIAGNOSIS — Z800 Family history of malignant neoplasm of digestive organs: Secondary | ICD-10-CM

## 2018-07-29 DIAGNOSIS — E109 Type 1 diabetes mellitus without complications: Secondary | ICD-10-CM | POA: Diagnosis present

## 2018-07-29 DIAGNOSIS — R1110 Vomiting, unspecified: Secondary | ICD-10-CM | POA: Diagnosis present

## 2018-07-29 DIAGNOSIS — Z79899 Other long term (current) drug therapy: Secondary | ICD-10-CM

## 2018-07-29 DIAGNOSIS — I10 Essential (primary) hypertension: Secondary | ICD-10-CM

## 2018-07-29 DIAGNOSIS — R739 Hyperglycemia, unspecified: Secondary | ICD-10-CM

## 2018-07-29 DIAGNOSIS — E876 Hypokalemia: Secondary | ICD-10-CM | POA: Diagnosis present

## 2018-07-29 DIAGNOSIS — Z794 Long term (current) use of insulin: Secondary | ICD-10-CM

## 2018-07-29 DIAGNOSIS — E860 Dehydration: Secondary | ICD-10-CM | POA: Diagnosis present

## 2018-07-29 DIAGNOSIS — K529 Noninfective gastroenteritis and colitis, unspecified: Secondary | ICD-10-CM | POA: Diagnosis present

## 2018-07-29 DIAGNOSIS — E1043 Type 1 diabetes mellitus with diabetic autonomic (poly)neuropathy: Secondary | ICD-10-CM | POA: Diagnosis present

## 2018-07-29 DIAGNOSIS — E1065 Type 1 diabetes mellitus with hyperglycemia: Secondary | ICD-10-CM | POA: Diagnosis present

## 2018-07-29 DIAGNOSIS — F329 Major depressive disorder, single episode, unspecified: Secondary | ICD-10-CM | POA: Diagnosis present

## 2018-07-29 DIAGNOSIS — M546 Pain in thoracic spine: Secondary | ICD-10-CM | POA: Diagnosis present

## 2018-07-29 DIAGNOSIS — E1159 Type 2 diabetes mellitus with other circulatory complications: Secondary | ICD-10-CM | POA: Diagnosis present

## 2018-07-29 DIAGNOSIS — E1069 Type 1 diabetes mellitus with other specified complication: Secondary | ICD-10-CM | POA: Diagnosis not present

## 2018-07-29 DIAGNOSIS — R112 Nausea with vomiting, unspecified: Secondary | ICD-10-CM | POA: Diagnosis not present

## 2018-07-29 DIAGNOSIS — G8929 Other chronic pain: Secondary | ICD-10-CM | POA: Diagnosis present

## 2018-07-29 DIAGNOSIS — G9341 Metabolic encephalopathy: Secondary | ICD-10-CM | POA: Diagnosis present

## 2018-07-29 DIAGNOSIS — K3184 Gastroparesis: Secondary | ICD-10-CM | POA: Diagnosis not present

## 2018-07-29 DIAGNOSIS — K219 Gastro-esophageal reflux disease without esophagitis: Secondary | ICD-10-CM | POA: Diagnosis present

## 2018-07-29 DIAGNOSIS — Z833 Family history of diabetes mellitus: Secondary | ICD-10-CM

## 2018-07-29 DIAGNOSIS — Z8249 Family history of ischemic heart disease and other diseases of the circulatory system: Secondary | ICD-10-CM

## 2018-07-29 DIAGNOSIS — I152 Hypertension secondary to endocrine disorders: Secondary | ICD-10-CM | POA: Diagnosis present

## 2018-07-29 DIAGNOSIS — M797 Fibromyalgia: Secondary | ICD-10-CM | POA: Diagnosis present

## 2018-07-29 LAB — CBC WITH DIFFERENTIAL/PLATELET
Abs Immature Granulocytes: 0.01 10*3/uL (ref 0.00–0.07)
Basophils Absolute: 0 10*3/uL (ref 0.0–0.1)
Basophils Relative: 1 %
Eosinophils Absolute: 0.3 10*3/uL (ref 0.0–0.5)
Eosinophils Relative: 6 %
HCT: 35.4 % — ABNORMAL LOW (ref 36.0–46.0)
Hemoglobin: 11.7 g/dL — ABNORMAL LOW (ref 12.0–15.0)
Immature Granulocytes: 0 %
Lymphocytes Relative: 23 %
Lymphs Abs: 1 10*3/uL (ref 0.7–4.0)
MCH: 28.1 pg (ref 26.0–34.0)
MCHC: 33.1 g/dL (ref 30.0–36.0)
MCV: 84.9 fL (ref 80.0–100.0)
Monocytes Absolute: 0.2 10*3/uL (ref 0.1–1.0)
Monocytes Relative: 4 %
Neutro Abs: 2.9 10*3/uL (ref 1.7–7.7)
Neutrophils Relative %: 66 %
Platelets: 212 10*3/uL (ref 150–400)
RBC: 4.17 MIL/uL (ref 3.87–5.11)
RDW: 12.1 % (ref 11.5–15.5)
WBC: 4.3 10*3/uL (ref 4.0–10.5)
nRBC: 0 % (ref 0.0–0.2)

## 2018-07-29 LAB — I-STAT BETA HCG BLOOD, ED (MC, WL, AP ONLY): I-stat hCG, quantitative: <5 m[IU]/mL (ref <5)

## 2018-07-29 LAB — CBG MONITORING, ED
Glucose-Capillary: 412 mg/dL — ABNORMAL HIGH (ref 70–99)
Glucose-Capillary: 330 mg/dL — ABNORMAL HIGH (ref 70–99)

## 2018-07-29 LAB — COMPREHENSIVE METABOLIC PANEL
ALT: 25 U/L (ref 0–44)
AST: 20 U/L (ref 15–41)
Albumin: 3.7 g/dL (ref 3.5–5.0)
Alkaline Phosphatase: 109 U/L (ref 38–126)
Anion gap: 15 (ref 5–15)
BUN: 23 mg/dL — ABNORMAL HIGH (ref 6–20)
CO2: 24 mmol/L (ref 22–32)
Calcium: 9.6 mg/dL (ref 8.9–10.3)
Chloride: 97 mmol/L — ABNORMAL LOW (ref 98–111)
Creatinine, Ser: 0.88 mg/dL (ref 0.44–1.00)
GFR calc Af Amer: >60 mL/min (ref >60)
GFR calc non Af Amer: >60 mL/min (ref >60)
Glucose, Bld: 429 mg/dL — ABNORMAL HIGH (ref 70–99)
Potassium: 4.2 mmol/L (ref 3.5–5.1)
Sodium: 136 mmol/L (ref 135–145)
Total Bilirubin: 0.6 mg/dL (ref 0.3–1.2)
Total Protein: 8 g/dL (ref 6.5–8.1)

## 2018-07-29 LAB — LIPASE, BLOOD: Lipase: 24 U/L (ref 11–51)

## 2018-07-29 LAB — MAGNESIUM: Magnesium: 2.1 mg/dL (ref 1.7–2.4)

## 2018-07-29 IMAGING — DX PORTABLE CHEST - 1 VIEW
1 series · 1 of 1 positions shown · non-contrast
Comparison: None.

CLINICAL DATA: Diabetic patient.  Unresponsive.

EXAM:
PORTABLE CHEST 1 VIEW

[chest ap]
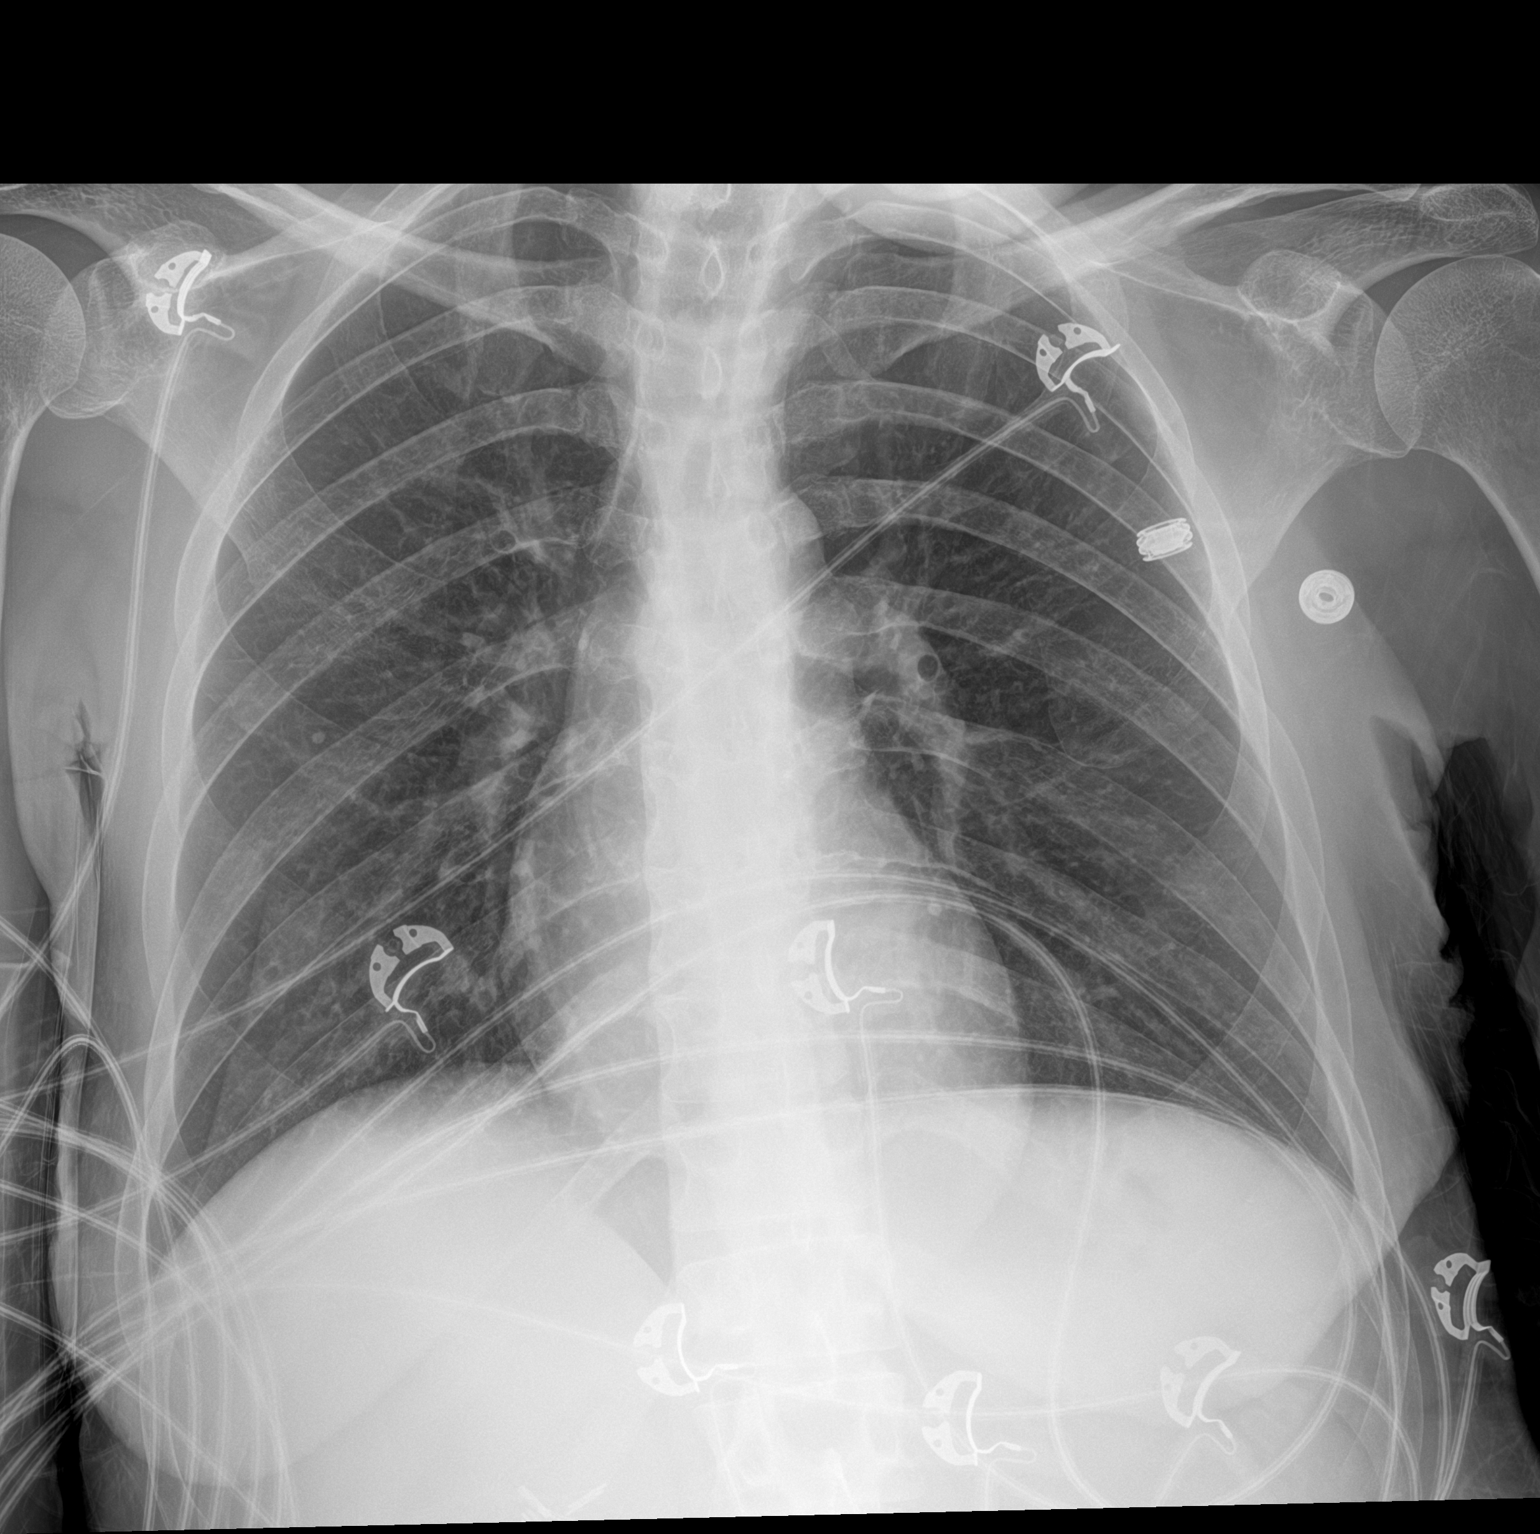

[1 of 1 positions shown; findings below may reference images not displayed]

FINDINGS: The heart size and mediastinal contours are within normal limits.
Both lungs are clear. The visualized skeletal structures are
unremarkable.
IMPRESSION: No active disease.

## 2018-07-29 MED ORDER — SODIUM CHLORIDE 0.9 % IV SOLN: INTRAVENOUS | Status: DC

## 2018-07-29 MED ORDER — ONDANSETRON HCL 4 MG/2ML IJ SOLN
4.0000 mg | Freq: Once | INTRAMUSCULAR | Status: AC
  Administered 2018-07-29: 4 mg via INTRAVENOUS
  Filled 2018-07-29: qty 2

## 2018-07-29 MED ORDER — INSULIN REGULAR(HUMAN) IN NACL 100-0.9 UT/100ML-% IV SOLN
INTRAVENOUS | Status: DC
  Administered 2018-07-29: 2.7 [IU]/h via INTRAVENOUS
  Filled 2018-07-29: qty 100

## 2018-07-29 MED ORDER — PROMETHAZINE HCL 25 MG/ML IJ SOLN
12.5000 mg | Freq: Once | INTRAMUSCULAR | Status: AC
  Administered 2018-07-29: 12.5 mg via INTRAVENOUS
  Filled 2018-07-29: qty 1

## 2018-07-29 MED ORDER — SODIUM CHLORIDE 0.9 % IV BOLUS
1000.0000 mL | Freq: Once | INTRAVENOUS | Status: AC
  Administered 2018-07-29: 1000 mL via INTRAVENOUS

## 2018-07-29 MED ORDER — SODIUM CHLORIDE 0.9 % IV SOLN
INTRAVENOUS | Status: DC
  Administered 2018-07-29: 23:00:00 via INTRAVENOUS

## 2018-07-29 MED ORDER — DEXTROSE-NACL 5-0.45 % IV SOLN
INTRAVENOUS | Status: DC
  Administered 2018-07-30: 02:00:00 via INTRAVENOUS

## 2018-07-29 MED ORDER — FENTANYL CITRATE (PF) 100 MCG/2ML IJ SOLN
25.0000 ug | Freq: Once | INTRAMUSCULAR | Status: AC
  Administered 2018-07-29: 25 ug via INTRAVENOUS
  Filled 2018-07-29: qty 2

## 2018-07-29 MED ORDER — SODIUM CHLORIDE 0.9 % IV BOLUS
1000.0000 mL | Freq: Once | INTRAVENOUS | Status: AC
  Administered 2018-07-29: 22:00:00 1000 mL via INTRAVENOUS

## 2018-07-29 NOTE — H&P (Signed)
History and Physical   [name] OBS:[id number] DOB: 25-Dec-1990 DOA: 07/29/2018  Referring MD/NP/PA: Dr. Bobby Rumpf  PCP: [name], MD   Outpatient Specialists: Dr. Havery Moros, GI  Patient coming from: Home  Chief Complaint: Intractable nausea vomiting and confusion  HPI: [name] is a 28 y.o. female with medical history significant of recurrent cyclical vomiting due to diabetic gastroparesis, type 1 diabetes, fibromyalgia, anxiety disorder, GERD who was last admitted to the hospital on June 04, 2018 with intractable vomiting.  She has since followed up with Dr. Havery Moros for her recurrent symptoms.  Patient has had history of prolonged QT in the interval and was therefore not able to use domperidone.  Also has tried Reglan in the past with no relief and then had problem with a prolonged QT.  She has had some control with fentanyl in the past instead of conventional treatments.  She has been on Phenergan and Remeron as well as Protonix.  Patient came to the ER today as symptoms have returned.  She is having persistent nausea vomiting.  Also hyperglycemia but not acidotic.  Symptoms are similar to her previous gastric paretic symptoms.  She is being admitted to the hospital for treatment.  She has not been able to keep anything down for the last 24 hours.  Denied any fever.  Denied diarrhea.  Denied any hematemesis or melena.  No bright red blood per rectum..  ED Course: Temperature is 97 for blood pressure 180/116 pulse 114 respiratory rate of 31 with oxygen sat 92% on room air.  Sodium 136 potassium 4.2 chloride 97 CO2 24.  Glucose 429 BUN 23 and creatinine 0.88.  CBC appears to be within normal.  Chest x-ray also showed no significant findings.  Patient given combination of Zofran Phenergan and fentanyl.  Minimal response but is being admitted for treatment.  Review of Systems: As per HPI otherwise 10 point review of systems negative.    Past Medical History:  Diagnosis Date    Allergy    Anemia    Anxiety    Blood transfusion without reported diagnosis    Dec 2018   Cataract    right eye   Depression    Diabetes type 1, uncontrolled ([place]) 11/14/2011   Since age 73   Fibromyalgia    Gastroparesis    GERD (gastroesophageal reflux disease)    Hypertension    Infection    UTI April 2016    Past Surgical History:  Procedure Laterality Date   ANKLE SURGERY     CHOLECYSTECTOMY  11/15/2011   Procedure: LAPAROSCOPIC CHOLECYSTECTOMY WITH INTRAOPERATIVE CHOLANGIOGRAM;  Surgeon: [name], MD;  Location: [facility];  Service: General;  Laterality: N/A;   COLONOSCOPY     COLONOSCOPY WITH PROPOFOL N/A 06/27/2017   Procedure: COLONOSCOPY WITH PROPOFOL;  Surgeon: Milus Banister, MD;  Location: [facility];  Service: Endoscopy;  Laterality: N/A;   ESOPHAGOGASTRODUODENOSCOPY  12/03/2011   Procedure: ESOPHAGOGASTRODUODENOSCOPY (EGD);  Surgeon: Beryle Beams, MD;  Location: [facility];  Service: Endoscopy;  Laterality: N/A;   FLEXIBLE SIGMOIDOSCOPY N/A 03/10/2017   Procedure: FLEXIBLE SIGMOIDOSCOPY;  Surgeon: Carol Ada, MD;  Location: [facility];  Service: Endoscopy;  Laterality: N/A;   INCISION AND DRAINAGE PERIRECTAL ABSCESS N/A 03/01/2017   Procedure: IRRIGATION AND DEBRIDEMENT PERIRECTAL ABSCESS;  Surgeon: Alphonsa Overall, MD;  Location: [facility];  Service: General;  Laterality: N/A;   IRRIGATION AND DEBRIDEMENT BUTTOCKS N/A 03/23/2017   Procedure: IRRIGATION AND DEBRIDEMENT BUTTOCKS, SETON PLACEMENT;  Surgeon: [name], MD;  Location: [facility];  Service: General;  Laterality: N/A;   LAPAROSCOPY  11/23/2011   Procedure: LAPAROSCOPY DIAGNOSTIC;  Surgeon: Edward Jolly, MD;  Location: [facility];  Service: General;  Laterality: N/A;   SIGMOIDOSCOPY     UPPER GASTROINTESTINAL ENDOSCOPY     WISDOM TOOTH EXTRACTION       reports that she has never smoked. She has never used smokeless tobacco. She reports that she does not drink  alcohol or use drugs.  Allergies  Allergen Reactions   Other Anaphylaxis    Reaction to [place] nuts    Lactose Intolerance (Gi) Diarrhea    Family History  Problem Relation Age of Onset   Diabetes Mother    Hypertension Father    Colon cancer Paternal Grandmother        pt thinks PGM was dx in her 41's   Diabetes Paternal Grandmother    Diabetes Maternal Grandmother    Diabetes Maternal Grandfather    Diabetes Paternal Grandfather    Diabetes Other    Esophageal cancer Neg Hx    Liver cancer Neg Hx    Pancreatic cancer Neg Hx    Stomach cancer Neg Hx    Rectal cancer Neg Hx      Prior to Admission medications   Medication Sig Start Date End Date Taking? Authorizing Provider  capsaicin (ZOSTRIX) 0.025 % cream Apply topically 2 (two) times daily. 06/10/18   Sheikh, Georgina Quint Latif, DO  Continuous Blood Gluc Sensor (FREESTYLE LIBRE 14 DAY SENSOR) MISC 1 Device by Does not apply route every 14 (fourteen) days. 06/22/18   [name], MD  dicyclomine (BENTYL) 20 MG tablet Take 1 tablet (20 mg total) by mouth 3 (three) times daily as needed for spasms (abdominal cramping). 06/03/18   Long, Wonda Olds, MD  feeding supplement, ENSURE ENLIVE, (ENSURE ENLIVE) LIQD Take 237 mLs by mouth 2 (two) times daily between meals. 05/18/18   Georgette Shell, MD  furosemide (LASIX) 40 MG tablet Take 1 tablet (40 mg total) by mouth daily. 07/20/18   Armbruster, Carlota Raspberry, MD  hyoscyamine (LEVSIN SL) 0.125 MG SL tablet Place 1 tablet (0.125 mg total) under the tongue every 6 (six) hours as needed for cramping (abdominal pain). 02/20/18   Shelly Coss, MD  insulin lispro (HUMALOG) 100 UNIT/ML injection Inject 0.04 mLs (4 Units total) into the skin 3 (three) times daily with meals. 06/18/18   Renato Shin, MD  LANTUS 100 UNIT/ML injection Inject 0.05 mLs (5 Units total) into the skin at bedtime. Patient using Vials 06/18/18   Renato Shin, MD  LORazepam (ATIVAN) 0.5 MG tablet Take 1 tablet  (0.5 mg total) by mouth 2 (two) times daily as needed for anxiety. 05/31/18   [name], MD  losartan (COZAAR) 25 MG tablet Take 1 tablet (25 mg total) by mouth daily. 06/22/18   [name], MD  metoCLOPramide (REGLAN) 10 MG tablet Take 1 tablet (10 mg total) by mouth every 6 (six) hours as needed for nausea or vomiting. 02/20/18   Shelly Coss, MD  mirtazapine (REMERON) 30 MG tablet Take 1 tablet (30 mg total) by mouth at bedtime. 05/31/18   [name], MD  ondansetron (ZOFRAN) 4 MG tablet Take 1 tablet (4 mg total) by mouth every 8 (eight) hours as needed for nausea or vomiting (If necessary, dont combine with Reglan.). 03/25/18   Amin, Jeanella Flattery, MD  pantoprazole (PROTONIX) 40 MG tablet Take 1 tablet (40  mg total) by mouth 2 (two) times daily. 06/10/18   Raiford Noble Latif, DO  promethazine (PHENERGAN) 12.5 MG suppository Place 1 suppository (12.5 mg total) rectally every 8 (eight) hours as needed for nausea or vomiting. Note: Do not take while taking oral phenergan 07/27/18   Armbruster, Carlota Raspberry, MD  promethazine (PHENERGAN) 12.5 MG tablet TAKE 1 TABLET BY MOUTH  EVERY 8 HOURS AS NEEDED FOR NAUSEA AND VOMITING 07/19/18   Yetta Flock, MD    Physical Exam: Vitals:   07/29/18 2145 07/29/18 2230 07/29/18 2315 07/29/18 2330  BP: (!) 167/118 (!) 180/116 (!) 167/109 (!) 166/99  Pulse: (!) 109 (!) 114 (!) 109 (!) 106  Resp: 18 (!) 22 13   Temp:      TempSrc:      SpO2: 100% 100% 100% 92%      Constitutional: Patient is in obvious distress and retching Vitals:   07/29/18 2145 07/29/18 2230 07/29/18 2315 07/29/18 2330  BP: (!) 167/118 (!) 180/116 (!) 167/109 (!) 166/99  Pulse: (!) 109 (!) 114 (!) 109 (!) 106  Resp: 18 (!) 22 13   Temp:      TempSrc:      SpO2: 100% 100% 100% 92%   Eyes: PERRL, lids and conjunctivae normal ENMT: Mucous membranes are dry. Posterior pharynx clear of any exudate or lesions.Normal dentition.  Neck: normal, supple, no masses, no  thyromegaly Respiratory: clear to auscultation bilaterally, no wheezing, no crackles. Normal respiratory effort. No accessory muscle use.  Cardiovascular: Regular rate and rhythm, no murmurs / rubs / gallops. No extremity edema. 2+ pedal pulses. No carotid bruits.  Abdomen: Mild epigastric tenderness, no masses palpated. No hepatosplenomegaly. Bowel sounds positive.  Musculoskeletal: no clubbing / cyanosis. No joint deformity upper and lower extremities. Good ROM, no contractures. Normal muscle tone.  Skin: no rashes, lesions, ulcers. No induration Neurologic: CN 2-12 grossly intact. Sensation intact, DTR normal. Strength 5/5 in all 4.  Psychiatric: Normal judgment and insight. Alert and oriented x 3. Normal mood.     Labs on Admission: I have personally reviewed following labs and imaging studies  CBC: Recent Labs  Lab 07/29/18 2108  WBC 4.3  NEUTROABS 2.9  HGB 11.7*  HCT 35.4*  MCV 84.9  PLT 485   Basic Metabolic Panel: Recent Labs  Lab 07/29/18 2108  NA 136  K 4.2  CL 97*  CO2 24  GLUCOSE 429*  BUN 23*  CREATININE 0.88  CALCIUM 9.6  MG 2.1   GFR: CrCl cannot be calculated (Unknown ideal weight.). Liver Function Tests: Recent Labs  Lab 07/29/18 2108  AST 20  ALT 25  ALKPHOS 109  BILITOT 0.6  PROT 8.0  ALBUMIN 3.7   Recent Labs  Lab 07/29/18 2108  LIPASE 24   No results for input(s): AMMONIA in the last 168 hours. Coagulation Profile: No results for input(s): INR, PROTIME in the last 168 hours. Cardiac Enzymes: No results for input(s): CKTOTAL, CKMB, CKMBINDEX, TROPONINI in the last 168 hours. BNP (last 3 results) No results for input(s): PROBNP in the last 8760 hours. HbA1C: No results for input(s): HGBA1C in the last 72 hours. CBG: Recent Labs  Lab 07/29/18 2046 07/29/18 2314 07/30/18 0037  GLUCAP 412* 330* 275*   Lipid Profile: No results for input(s): CHOL, HDL, LDLCALC, TRIG, CHOLHDL, LDLDIRECT in the last 72 hours. Thyroid Function  Tests: No results for input(s): TSH, T4TOTAL, FREET4, T3FREE, THYROIDAB in the last 72 hours. Anemia Panel: No results for input(s): VITAMINB12,  FOLATE, FERRITIN, TIBC, IRON, RETICCTPCT in the last 72 hours. Urine analysis:    Component Value Date/Time   COLORURINE YELLOW 06/04/2018 1209   APPEARANCEUR CLEAR 06/04/2018 1209   LABSPEC 1.020 06/04/2018 1209   PHURINE 7.0 06/04/2018 1209   GLUCOSEU >=500 (A) 06/04/2018 1209   HGBUR SMALL (A) 06/04/2018 1209   BILIRUBINUR NEGATIVE 06/04/2018 1209   BILIRUBINUR negative 01/02/2015 1717   KETONESUR 20 (A) 06/04/2018 1209   PROTEINUR 100 (A) 06/04/2018 1209   UROBILINOGEN 0.2 01/13/2015 0223   NITRITE NEGATIVE 06/04/2018 1209   LEUKOCYTESUR NEGATIVE 06/04/2018 1209   Sepsis Labs: @LABRCNTIP (procalcitonin:4,lacticidven:4) )No results found for this or any previous visit (from the past 240 hour(s)).   Radiological Exams on Admission: Dg Chest Port 1 View  Result Date: 07/29/2018 CLINICAL DATA:  Diabetic patient.  Unresponsive. EXAM: PORTABLE CHEST 1 VIEW COMPARISON:  None. FINDINGS: The heart size and mediastinal contours are within normal limits. Both lungs are clear. The visualized skeletal structures are unremarkable. IMPRESSION: No active disease. Electronically Signed   By: Nelson Chimes M.D.   On: 07/29/2018 21:20     Assessment/Plan Principal Problem:   Diabetic gastroparesis associated with type 1 diabetes mellitus ([place]) Active Problems:   Hypertension associated with diabetes ([place])   Gastroparesis   Diabetes mellitus type 1 ([facility])   Cyclical vomiting syndrome   Acute metabolic encephalopathy     #1 intractable nausea and vomiting: Secondary to diabetic gastroparesis.  Patient will be admitted for supportive care.  I will resume Zofran with Phenergan.  Occasional fentanyl if it helps.  Goal will be symptom control.  GI consult in the morning for more advice.  IV fluids.  #2 hypertension: Once patient is able to take p.o.'s  we will resume home regimen.  In the meantime we will consider IV labetalol as needed  #3 diabetes type 1: Patient will be n.p.o.  Sliding scale insulin will be ordered.  IV fluids.  #4 acute metabolic encephalopathy: Patient's mental status is improving.  Most likely secondary to acute illness.  #5 dehydration: Most likely secondary to intractable nausea with vomiting.  Continue management.  #6.  GERD: IV Protonix will be ordered.   DVT prophylaxis: Heparin Code Status: Full code Family Communication: Discussed care with patient Disposition Plan: Home Consults called: GI will be consulted in the morning Admission status: Observation  Severity of Illness: The appropriate patient status for this patient is OBSERVATION. Observation status is judged to be reasonable and necessary in order to provide the required intensity of service to ensure the patient's safety. The patient's presenting symptoms, physical exam findings, and initial radiographic and laboratory data in the context of their medical condition is felt to place them at decreased risk for further clinical deterioration. Furthermore, it is anticipated that the patient will be medically stable for discharge from the hospital within 2 midnights of admission. The following factors support the patient status of observation.   " The patient's presenting symptoms include intractable nausea with vomiting. " The physical exam findings include no new findings. " The initial radiographic and laboratory data are no significant change from baseline.     Barbette Merino MD [facility] Pager [phone]  If 7PM-7AM, please contact night-coverage [email or web address] Password [email or web address]  07/30/2018, 1:43 AM

## 2018-07-29 NOTE — ED Provider Notes (Signed)
[facility] EMERGENCY DEPARTMENT Provider Note   CSN: [id number] Arrival date & time: 07/29/18  2043    History   Chief Complaint Chief Complaint  Patient presents with  . Altered Mental Status    HPI [name] is a 28 y.o. female.     Patient arrived with altered mental status.  Patient was brought in by POV.  Her husband dropped her off.  Patient was only responding to pain.  Patient has a history of type 1 diabetes and is known to have a history of significant nausea and vomiting thought to be secondary to gastroparesis.  Patient would talk when she got here.  Stated that she been vomiting nonstop for 2 days.  Patient is known to have chronic abdominal pain.  Patient is on insulin for her diabetes.  Patient has been in DKA before.  Patient also has a history of fibromyalgia.     Past Medical History:  Diagnosis Date  . Allergy   . Anemia   . Anxiety   . Blood transfusion without reported diagnosis    Dec 2018  . Cataract    right eye  . Depression   . Diabetes type 1, uncontrolled ([place]) 11/14/2011   Since age 28  . Fibromyalgia   . Gastroparesis   . GERD (gastroesophageal reflux disease)   . Hypertension   . Infection    UTI April 2016    Patient Active Problem List   Diagnosis Date Noted  . Acute metabolic encephalopathy 86/76/1951  . Intractable vomiting   . Cyclical vomiting syndrome 06/07/2018  . Diabetic gastroparesis associated with type 1 diabetes mellitus ([place]) 06/04/2018  . Anxiety 05/31/2018  . Peripheral edema 05/31/2018  . Normocytic anemia 07/10/2017  . GERD (gastroesophageal reflux disease) 06/23/2017  . 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 Diabetes mellitus type 1 ([place]) 04/28/2013  . Protein-calorie malnutrition, severe ([place]) 04/28/2013  . Hypertension associated with diabetes ([place]) 11/14/2011  . Gastroparesis 11/14/2011  . Abdominal pain 09/18/2011    Past Surgical History:   Procedure Laterality Date  . ANKLE SURGERY    . CHOLECYSTECTOMY  11/15/2011   Procedure: LAPAROSCOPIC CHOLECYSTECTOMY WITH INTRAOPERATIVE CHOLANGIOGRAM;  Surgeon: [name], MD;  [facility];  Service: General;  Laterality: N/A;  . COLONOSCOPY    . COLONOSCOPY WITH PROPOFOL N/A 06/27/2017   Procedure: COLONOSCOPY WITH PROPOFOL;  Surgeon: Milus Banister, MD;  [facility];  Service: Endoscopy;  Laterality: N/A;  . ESOPHAGOGASTRODUODENOSCOPY  12/03/2011   Procedure: ESOPHAGOGASTRODUODENOSCOPY (EGD);  Surgeon: Beryle Beams, MD;  Location: [facility];  Service: Endoscopy;  Laterality: N/A;  . FLEXIBLE SIGMOIDOSCOPY N/A 03/10/2017   Procedure: FLEXIBLE SIGMOIDOSCOPY;  Surgeon: Carol Ada, MD;  [facility];  Service: Endoscopy;  Laterality: N/A;  . INCISION AND DRAINAGE PERIRECTAL ABSCESS N/A 03/01/2017   Procedure: IRRIGATION AND DEBRIDEMENT PERIRECTAL ABSCESS;  Surgeon: Alphonsa Overall, MD;  [facility];  Service: General;  Laterality: N/A;  . IRRIGATION AND DEBRIDEMENT BUTTOCKS N/A 03/23/2017   Procedure: IRRIGATION AND DEBRIDEMENT BUTTOCKS, SETON PLACEMENT;  Surgeon: [name], MD;  [facility];  Service: General;  Laterality: N/A;  . LAPAROSCOPY  11/23/2011   Procedure: LAPAROSCOPY DIAGNOSTIC;  Surgeon: Edward Jolly, MD;  [facility];  Service: General;  Laterality: N/A;  . SIGMOIDOSCOPY    . UPPER GASTROINTESTINAL ENDOSCOPY    . [place] EXTRACTION       OB History    [place]  1   Para  1   Term      Preterm  1   AB      Living  1     SAB      TAB      Ectopic      Multiple  0   Live Births  1            Home Medications    Prior to Admission medications   Medication Sig Start Date End Date Taking? Authorizing Provider  capsaicin (ZOSTRIX) 0.025 % cream Apply topically 2 (two) times daily. 06/10/18   Sheikh, Georgina Quint Latif, DO  Continuous Blood Gluc Sensor (FREESTYLE LIBRE 14 DAY SENSOR) MISC 1  Device by Does not apply route every 14 (fourteen) days. 06/22/18   [name], MD  dicyclomine (BENTYL) 20 MG tablet Take 1 tablet (20 mg total) by mouth 3 (three) times daily as needed for spasms (abdominal cramping). 06/03/18   Long, Wonda Olds, MD  feeding supplement, ENSURE ENLIVE, (ENSURE ENLIVE) LIQD Take 237 mLs by mouth 2 (two) times daily between meals. 05/18/18   Georgette Shell, MD  furosemide (LASIX) 40 MG tablet Take 1 tablet (40 mg total) by mouth daily. 07/20/18   Armbruster, Carlota Raspberry, MD  hyoscyamine (LEVSIN SL) 0.125 MG SL tablet Place 1 tablet (0.125 mg total) under the tongue every 6 (six) hours as needed for cramping (abdominal pain). 02/20/18   Shelly Coss, MD  insulin lispro (HUMALOG) 100 UNIT/ML injection Inject 0.04 mLs (4 Units total) into the skin 3 (three) times daily with meals. 06/18/18   Renato Shin, MD  LANTUS 100 UNIT/ML injection Inject 0.05 mLs (5 Units total) into the skin at bedtime. Patient using Vials 06/18/18   Renato Shin, MD  LORazepam (ATIVAN) 0.5 MG tablet Take 1 tablet (0.5 mg total) by mouth 2 (two) times daily as needed for anxiety. 05/31/18   [name], MD  losartan (COZAAR) 25 MG tablet Take 1 tablet (25 mg total) by mouth daily. 06/22/18   [name], MD  metoCLOPramide (REGLAN) 10 MG tablet Take 1 tablet (10 mg total) by mouth every 6 (six) hours as needed for nausea or vomiting. 02/20/18   Shelly Coss, MD  mirtazapine (REMERON) 30 MG tablet Take 1 tablet (30 mg total) by mouth at bedtime. 05/31/18   [name], MD  ondansetron (ZOFRAN) 4 MG tablet Take 1 tablet (4 mg total) by mouth every 8 (eight) hours as needed for nausea or vomiting (If necessary, dont combine with Reglan.). 03/25/18   Amin, Jeanella Flattery, MD  pantoprazole (PROTONIX) 40 MG tablet Take 1 tablet (40 mg total) by mouth 2 (two) times daily. 06/10/18   Raiford Noble Latif, DO  promethazine (PHENERGAN) 12.5 MG suppository Place 1 suppository (12.5 mg total) rectally  every 8 (eight) hours as needed for nausea or vomiting. Note: Do not take while taking oral phenergan 07/27/18   Armbruster, Carlota Raspberry, MD  promethazine (PHENERGAN) 12.5 MG tablet TAKE 1 TABLET BY MOUTH  EVERY 8 HOURS AS NEEDED FOR NAUSEA AND VOMITING 07/19/18   Armbruster, Carlota Raspberry, MD    Family History Family History  Problem Relation Age of Onset  . Diabetes Mother   . Hypertension Father   . Colon cancer Paternal Grandmother        pt thinks PGM was dx in her 82's  . Diabetes Paternal Grandmother   . Diabetes Maternal Grandmother   . Diabetes Maternal Grandfather   .  Diabetes Paternal Grandfather   . Diabetes Other   . Esophageal cancer Neg Hx   . Liver cancer Neg Hx   . Pancreatic cancer Neg Hx   . Stomach cancer Neg Hx   . Rectal cancer Neg Hx     Social History Social History   Tobacco Use  . Smoking status: Never Smoker  . Smokeless tobacco: Never Used  Substance Use Topics  . Alcohol use: No    Alcohol/week: 0.0 standard drinks    Frequency: Never  . Drug use: No     Allergies   Other and Lactose intolerance (gi)   Review of Systems Review of Systems  Constitutional: Negative for chills and fever.  HENT: Negative for rhinorrhea and sore throat.   Eyes: Negative for visual disturbance.  Respiratory: Negative for cough and shortness of breath.   Cardiovascular: Negative for chest pain and leg swelling.  Gastrointestinal: Positive for nausea and vomiting. Negative for abdominal pain and diarrhea.  Genitourinary: Negative for dysuria.  Musculoskeletal: Negative for back pain and neck pain.  Skin: Negative for rash.  Neurological: Negative for dizziness, light-headedness and headaches.  Hematological: Does not bruise/bleed easily.  Psychiatric/Behavioral: Positive for confusion.     Physical Exam Updated Vital Signs BP (!) 166/99   Pulse (!) 106   Temp (!) 97.4 F (36.3 C) (Oral)   Resp 13   SpO2 92%   Physical Exam Vitals signs and nursing note  reviewed.  Constitutional:      General: She is in acute distress.     Appearance: She is well-developed.  HENT:     Head: Normocephalic and atraumatic.     Mouth/Throat:     Mouth: Mucous membranes are dry.  Eyes:     Conjunctiva/sclera: Conjunctivae normal.  Neck:     Musculoskeletal: Neck supple.  Cardiovascular:     Rate and Rhythm: Regular rhythm. Tachycardia present.     Heart sounds: No murmur.  Pulmonary:     Effort: Pulmonary effort is normal. No respiratory distress.     Breath sounds: Normal breath sounds.  Abdominal:     Palpations: Abdomen is soft.     Tenderness: There is no abdominal tenderness.  Musculoskeletal: Normal range of motion.        General: No swelling.  Skin:    General: Skin is warm and dry.  Neurological:     General: No focal deficit present.     Mental Status: She is alert and oriented to person, place, and time.      ED Treatments / Results  Labs (all labs ordered are listed, but only abnormal results are displayed) Labs Reviewed  CBC WITH DIFFERENTIAL/PLATELET - Abnormal; Notable for the following components:      Result Value   Hemoglobin 11.7 (*)    HCT 35.4 (*)    All other components within normal limits  COMPREHENSIVE METABOLIC PANEL - Abnormal; Notable for the following components:   Chloride 97 (*)    Glucose, Bld 429 (*)    BUN 23 (*)    All other components within normal limits  CBG MONITORING, ED - Abnormal; Notable for the following components:   Glucose-Capillary 412 (*)    All other components within normal limits  CBG MONITORING, ED - Abnormal; Notable for the following components:   Glucose-Capillary 330 (*)    All other components within normal limits  LIPASE, BLOOD  MAGNESIUM  I-STAT BETA HCG BLOOD, ED ([facility], [facility], [facility] ONLY)  I-STAT BETA HCG  BLOOD, ED ([facility], [facility], [facility] ONLY)    EKG EKG Interpretation  Date/Time:  Thursday July 29 2018 20:45:31 EDT Ventricular Rate:  100 PR Interval:  154 QRS Duration: 86 QT  Interval:  358 QTC Calculation: 461 R Axis:   84 Text Interpretation:  Normal sinus rhythm Normal ECG prominent T waves Confirmed by Fredia Sorrow [phone]) on 07/29/2018 9:54:46 PM   Radiology Dg Chest Port 1 View  Result Date: 07/29/2018 CLINICAL DATA:  Diabetic patient.  Unresponsive. EXAM: PORTABLE CHEST 1 VIEW COMPARISON:  None. FINDINGS: The heart size and mediastinal contours are within normal limits. Both lungs are clear. The visualized skeletal structures are unremarkable. IMPRESSION: No active disease. Electronically Signed   By: Nelson Chimes M.D.   On: 07/29/2018 21:20    Procedures Procedures (including critical care time)  CRITICAL CARE Performed by: Fredia Sorrow Total critical care time: 30 minutes Critical care time was exclusive of separately billable procedures and treating other patients. Critical care was necessary to treat or prevent imminent or life-threatening deterioration. Critical care was time spent personally by me on the following activities: development of treatment plan with patient and/or surrogate as well as nursing, discussions with consultants, evaluation of patient's response to treatment, examination of patient, obtaining history from patient or surrogate, ordering and performing treatments and interventions, ordering and review of laboratory studies, ordering and review of radiographic studies, pulse oximetry and re-evaluation of patient's condition.   Medications Ordered in ED Medications  dextrose 5 %-0.45 % sodium chloride infusion ( Intravenous Hold 07/29/18 2322)  insulin regular, human (MYXREDLIN) 100 units/ 100 mL infusion (2.7 Units/hr Intravenous New Bag/Given 07/29/18 2327)  sodium chloride 0.9 % bolus 1,000 mL (0 mLs Intravenous Stopped 07/29/18 2312)    And  sodium chloride 0.9 % bolus 1,000 mL (0 mLs Intravenous Stopped 07/29/18 2312)    And  0.9 %  sodium chloride infusion ( Intravenous New Bag/Given 07/29/18 2322)  0.9 %  sodium  chloride infusion ( Intravenous Not Given 07/29/18 2322)  ondansetron (ZOFRAN) injection 4 mg (4 mg Intravenous Given 07/29/18 2139)  promethazine (PHENERGAN) injection 12.5 mg (12.5 mg Intravenous Given 07/29/18 2225)  fentaNYL (SUBLIMAZE) injection 25 mcg (25 mcg Intravenous Given 07/29/18 2323)     Initial Impression / Assessment and Plan / ED Course  I have reviewed the triage vital signs and the nursing notes.  Pertinent labs & imaging results that were available during my care of the patient were reviewed by me and considered in my medical decision making (see chart for details).       Patient's blood sugar was in the 400 range.  But she was not in DKA.  Glucose today very lateralizer was ordered to control the blood sugar.  Patient also given 2 L of fluid.  Patient's labs without significant abnormalities.  Patient given Zofran and then Phenergan and then a dose of fentanyl which helped some.  Based on patient's other admissions she most likely requires admission based on this presentation.  Discussed with hospitalist who will admit.   Final Clinical Impressions(s) / ED Diagnoses   Final diagnoses:  Hyperglycemia  Gastroparesis  Type 1 diabetes mellitus with other specified complication [facility])    ED Discharge Orders    None       Fredia Sorrow, MD 07/30/18 0028

## 2018-07-29 NOTE — ED Notes (Signed)
Pt requesting pain meds.  St's she usually gets Dilaudid for pain

## 2018-07-29 NOTE — ED Notes (Signed)
ED TO INPATIENT HANDOFF REPORT  ED Nurse Name and Phone #: Jess F   S Name/Age/Gender [place] [name] 28 y.o. female Room/Bed: RESUSC/RESUSC  Code Status   Code Status: Prior  Home/SNF/Other Home Patient oriented to: self, place, time and situation Is this baseline? Yes   Triage Complete: Triage complete  Chief Complaint Vomiting    Triage Note Pt brought in by husband, only responding to pain. He reports hx of diabetes, has had N/V X few days. Emesis in triage.    Allergies Allergies  Allergen Reactions  . Other Anaphylaxis    Reaction to [place] nuts   . Lactose Intolerance (Gi) Diarrhea    Level of Care/Admitting Diagnosis ED Disposition    ED Disposition Condition Comment   Admit  Hospital Area: [facility] [[id number]]  Level of Care: Telemetry Medical [104]  I expect the patient will be discharged within 24 hours: Yes  LOW acuity---Tx typically complete <24 hrs---ACUTE conditions typically can be evaluated <24 hours---LABS likely to return to acceptable levels <24 hours---IS near functional baseline---EXPECTED to return to current living arrangement---NOT newly hypoxic: Meets criteria for 5C-Observation unit  Covid Evaluation: N/A  Diagnosis: Acute metabolic encephalopathy [[id number]]  Admitting Physician: Rometta Emery [[id number]]  Attending Physician: Rometta Emery [[id number]]  PT Class (Do Not Modify): Observation [104]  PT Acc Code (Do Not Modify): Observation [10022]       B Medical/Surgery History Past Medical History:  Diagnosis Date  . Allergy   . Anemia   . Anxiety   . Blood transfusion without reported diagnosis    Dec 2018  . Cataract    right eye  . Depression   . Diabetes type 1, uncontrolled ([facility]) 11/14/2011   Since age 34  . Fibromyalgia   . Gastroparesis   . GERD (gastroesophageal reflux disease)   . Hypertension   . Infection    UTI April 2016   Past Surgical History:  Procedure Laterality Date  . ANKLE SURGERY     . CHOLECYSTECTOMY  11/15/2011   Procedure: LAPAROSCOPIC CHOLECYSTECTOMY WITH INTRAOPERATIVE CHOLANGIOGRAM;  Surgeon: [name], MD;  [facility];  Service: General;  Laterality: N/A;  . COLONOSCOPY    . COLONOSCOPY WITH PROPOFOL N/A 06/27/2017   Procedure: COLONOSCOPY WITH PROPOFOL;  Surgeon: Rachael Fee, MD;  [facility];  Service: Endoscopy;  Laterality: N/A;  . ESOPHAGOGASTRODUODENOSCOPY  12/03/2011   Procedure: ESOPHAGOGASTRODUODENOSCOPY (EGD);  Surgeon: Theda Belfast, MD;  Location[facility];  Service: Endoscopy;  Laterality: N/A;  . FLEXIBLE SIGMOIDOSCOPY N/A 03/10/2017   Procedure: FLEXIBLE SIGMOIDOSCOPY;  Surgeon: Jeani Hawking, MD;  [facility];  Service: Endoscopy;  Laterality: N/A;  . INCISION AND DRAINAGE PERIRECTAL ABSCESS N/A 03/01/2017   Procedure: IRRIGATION AND DEBRIDEMENT PERIRECTAL ABSCESS;  Surgeon: Ovidio Kin, MD;  [facility];  Service: General;  Laterality: N/A;  . IRRIGATION AND DEBRIDEMENT BUTTOCKS N/A 03/23/2017   Procedure: IRRIGATION AND DEBRIDEMENT BUTTOCKS, SETON PLACEMENT;  Surgeon: Romie Levee, MD;  [facility];  Service: General;  Laterality: N/A;  . LAPAROSCOPY  11/23/2011   Procedure: LAPAROSCOPY DIAGNOSTIC;  Surgeon: Mariella Saa, MD;  [facility];  Service: General;  Laterality: N/A;  . SIGMOIDOSCOPY    . UPPER GASTROINTESTINAL ENDOSCOPY    . WISDOM TOOTH EXTRACTION       A IV Location/Drains/Wounds Patient Lines/Drains/Airways Status   Active Line/Drains/Airways    Name:   Placement date:   Placement time:   Site:  Days:   Peripheral IV 07/29/18 Left Wrist   07/29/18    2106    Wrist   less than 1          Intake/Output Last 24 hours  Intake/Output Summary (Last 24 hours) at 07/29/2018 2352 Last data filed at 07/29/2018 2312 Gross per 24 hour  Intake 2000 ml  Output -  Net 2000 ml    Labs/Imaging Results for orders placed or performed during the hospital encounter of  07/29/18 (from the past 48 hour(s))  CBG monitoring, ED     Status: Abnormal   Collection Time: 07/29/18  8:46 PM  Result Value Ref Range   Glucose-Capillary 412 (H) 70 - 99 mg/dL  CBC with Differential/Platelet     Status: Abnormal   Collection Time: 07/29/18  9:08 PM  Result Value Ref Range   WBC 4.3 4.0 - 10.5 K/uL   RBC 4.17 3.87 - 5.11 MIL/uL   Hemoglobin 11.7 (L) 12.0 - 15.0 g/dL   HCT 16.1 (L) 09.6 - 04.5 %   MCV 84.9 80.0 - 100.0 fL   MCH 28.1 26.0 - 34.0 pg   MCHC 33.1 30.0 - 36.0 g/dL   RDW 40.9 81.1 - 91.4 %   Platelets 212 150 - 400 K/uL   nRBC 0.0 0.0 - 0.2 %   Neutrophils Relative % 66 %   Neutro Abs 2.9 1.7 - 7.7 K/uL   Lymphocytes Relative 23 %   Lymphs Abs 1.0 0.7 - 4.0 K/uL   Monocytes Relative 4 %   Monocytes Absolute 0.2 0.1 - 1.0 K/uL   Eosinophils Relative 6 %   Eosinophils Absolute 0.3 0.0 - 0.5 K/uL   Basophils Relative 1 %   Basophils Absolute 0.0 0.0 - 0.1 K/uL   Immature Granulocytes 0 %   Abs Immature Granulocytes 0.01 0.00 - 0.07 K/uL    Comment: Performed at [facility], [address] [place], [place] [address]  Comprehensive metabolic panel     Status: Abnormal   Collection Time: 07/29/18  9:08 PM  Result Value Ref Range   Sodium 136 135 - 145 mmol/L   Potassium 4.2 3.5 - 5.1 mmol/L   Chloride 97 (L) 98 - 111 mmol/L   CO2 24 22 - 32 mmol/L   Glucose, Bld 429 (H) 70 - 99 mg/dL   BUN 23 (H) 6 - 20 mg/dL   Creatinine, Ser 6.21 0.44 - 1.00 mg/dL   Calcium 9.6 8.9 - 30.8 mg/dL   Total Protein 8.0 6.5 - 8.1 g/dL   Albumin 3.7 3.5 - 5.0 g/dL   AST 20 15 - 41 U/L   ALT 25 0 - 44 U/L   Alkaline Phosphatase 109 38 - 126 U/L   Total Bilirubin 0.6 0.3 - 1.2 mg/dL   GFR calc non Af Amer >60 >60 mL/min   GFR calc Af Amer >60 >60 mL/min   Anion gap 15 5 - 15    Comment: Performed at [facility], [address] [place], [place] [address]  Lipase, blood     Status: None   Collection Time: 07/29/18  9:08 PM  Result Value Ref Range    Lipase 24 11 - 51 U/L    Comment: Performed at [facility], [address], [place], [place] [address]  Magnesium     Status: None   Collection Time: 07/29/18  9:08 PM  Result Value Ref Range   Magnesium 2.1 1.7 - 2.4 mg/dL  Comment: Performed at [facility], [address], [place], [place] [address]  I-Stat beta hCG blood, ED     Status: None   Collection Time: 07/29/18  9:25 PM  Result Value Ref Range   I-stat hCG, quantitative <5.0 <5 mIU/mL   Comment 3            Comment:   GEST. AGE      CONC.  (mIU/mL)   <=1 WEEK        5 - 50     2 WEEKS       50 - 500     3 WEEKS       100 - 10,000     4 WEEKS     1,000 - 30,000        FEMALE AND NON-PREGNANT FEMALE:     LESS THAN 5 mIU/mL   CBG monitoring, ED     Status: Abnormal   Collection Time: 07/29/18 11:14 PM  Result Value Ref Range   Glucose-Capillary 330 (H) 70 - 99 mg/dL   Dg Chest Port 1 View  Result Date: 07/29/2018 CLINICAL DATA:  Diabetic patient.  Unresponsive. EXAM: PORTABLE CHEST 1 VIEW COMPARISON:  None. FINDINGS: The heart size and mediastinal contours are within normal limits. Both lungs are clear. The visualized skeletal structures are unremarkable. IMPRESSION: No active disease. Electronically Signed   By: Paulina Fusi M.D.   On: 07/29/2018 21:20    Pending Labs [employer] (From admission, onward)    Start     Ordered   Signed and Held  CBC  (enoxaparin (LOVENOX)    CrCl >/= 30 ml/min)  Once,   R    Comments:  Baseline for enoxaparin therapy IF NOT ALREADY DRAWN.  Notify MD if PLT < 100 K.    Signed and Held   Signed and Held  Creatinine, serum  (enoxaparin (LOVENOX)    CrCl >/= 30 ml/min)  Once,   R    Comments:  Baseline for enoxaparin therapy IF NOT ALREADY DRAWN.    Signed and Held   Signed and Held  Creatinine, serum  (enoxaparin (LOVENOX)    CrCl >/= 30 ml/min)  Weekly,   R    Comments:  while on enoxaparin therapy    Signed and Held   Signed and Held  Comprehensive metabolic panel   Tomorrow morning,   R     Signed and Held   Signed and Held  CBC  Tomorrow morning,   R     Signed and Held          Vitals/Pain Today's Vitals   07/29/18 2145 07/29/18 2228 07/29/18 2230 07/29/18 2315  BP: (!) 167/118  (!) 180/116 (!) 167/109  Pulse: (!) 109  (!) 114 (!) 109  Resp: 18  (!) 22 13  Temp:      TempSrc:      SpO2: 100%  100% 100%  PainSc:  10-Worst pain ever      Isolation Precautions No active isolations  Medications Medications  dextrose 5 %-0.45 % sodium chloride infusion ( Intravenous Hold 07/29/18 2322)  insulin regular, human (MYXREDLIN) 100 units/ 100 mL infusion (2.7 Units/hr Intravenous New Bag/Given 07/29/18 2327)  sodium chloride 0.9 % bolus 1,000 mL (0 mLs Intravenous Stopped 07/29/18 2312)    And  sodium chloride 0.9 % bolus 1,000 mL (0 mLs Intravenous Stopped 07/29/18 2312)    And  0.9 %  sodium chloride infusion ( Intravenous New Bag/Given  07/29/18 2322)  0.9 %  sodium chloride infusion ( Intravenous Not Given 07/29/18 2322)  ondansetron [facility]) injection 4 mg (4 mg Intravenous Given 07/29/18 2139)  promethazine (PHENERGAN) injection 12.5 mg (12.5 mg Intravenous Given 07/29/18 2225)  fentaNYL (SUBLIMAZE) injection 25 mcg (25 mcg Intravenous Given 07/29/18 2323)    Mobility walks High fall risk   Focused Assessments GI   R Recommendations: See Admitting Provider Note  Report given to:   Additional Notes: n/a

## 2018-07-29 NOTE — ED Notes (Signed)
Pt continues dry heaving, pt has been eating ice and drinking water out of her ice pack

## 2018-07-29 NOTE — Progress Notes (Signed)
Patient not appropriate to the floor. Patient is on  insulin drip.

## 2018-07-29 NOTE — ED Notes (Signed)
Pt st's no relief from Zofran

## 2018-07-29 NOTE — ED Triage Notes (Signed)
Pt brought in by husband, only responding to pain. He reports hx of diabetes, has had N/V X few days. Emesis in triage.

## 2018-07-29 NOTE — ED Notes (Signed)
Pt also c/o pain in upper back and neck.  Pt rates pain a #10 on pain scale 0/10

## 2018-07-30 ENCOUNTER — Other Ambulatory Visit: Payer: Self-pay

## 2018-07-30 DIAGNOSIS — E1043 Type 1 diabetes mellitus with diabetic autonomic (poly)neuropathy: Secondary | ICD-10-CM | POA: Diagnosis present

## 2018-07-30 DIAGNOSIS — R112 Nausea with vomiting, unspecified: Secondary | ICD-10-CM | POA: Diagnosis present

## 2018-07-30 DIAGNOSIS — E876 Hypokalemia: Secondary | ICD-10-CM | POA: Diagnosis present

## 2018-07-30 DIAGNOSIS — E860 Dehydration: Secondary | ICD-10-CM | POA: Diagnosis present

## 2018-07-30 DIAGNOSIS — Z800 Family history of malignant neoplasm of digestive organs: Secondary | ICD-10-CM | POA: Diagnosis not present

## 2018-07-30 DIAGNOSIS — E1069 Type 1 diabetes mellitus with other specified complication: Principal | ICD-10-CM

## 2018-07-30 DIAGNOSIS — I152 Hypertension secondary to endocrine disorders: Secondary | ICD-10-CM | POA: Diagnosis present

## 2018-07-30 DIAGNOSIS — K3184 Gastroparesis: Secondary | ICD-10-CM | POA: Diagnosis present

## 2018-07-30 DIAGNOSIS — R1115 Cyclical vomiting syndrome unrelated to migraine: Secondary | ICD-10-CM

## 2018-07-30 DIAGNOSIS — G9341 Metabolic encephalopathy: Secondary | ICD-10-CM | POA: Diagnosis present

## 2018-07-30 DIAGNOSIS — F329 Major depressive disorder, single episode, unspecified: Secondary | ICD-10-CM | POA: Diagnosis present

## 2018-07-30 DIAGNOSIS — Z8249 Family history of ischemic heart disease and other diseases of the circulatory system: Secondary | ICD-10-CM | POA: Diagnosis not present

## 2018-07-30 DIAGNOSIS — Z79899 Other long term (current) drug therapy: Secondary | ICD-10-CM | POA: Diagnosis not present

## 2018-07-30 DIAGNOSIS — F419 Anxiety disorder, unspecified: Secondary | ICD-10-CM | POA: Diagnosis present

## 2018-07-30 DIAGNOSIS — M797 Fibromyalgia: Secondary | ICD-10-CM | POA: Diagnosis present

## 2018-07-30 DIAGNOSIS — E1065 Type 1 diabetes mellitus with hyperglycemia: Secondary | ICD-10-CM | POA: Diagnosis present

## 2018-07-30 DIAGNOSIS — E1159 Type 2 diabetes mellitus with other circulatory complications: Secondary | ICD-10-CM

## 2018-07-30 DIAGNOSIS — K529 Noninfective gastroenteritis and colitis, unspecified: Secondary | ICD-10-CM | POA: Diagnosis present

## 2018-07-30 DIAGNOSIS — M546 Pain in thoracic spine: Secondary | ICD-10-CM | POA: Diagnosis present

## 2018-07-30 DIAGNOSIS — I10 Essential (primary) hypertension: Secondary | ICD-10-CM

## 2018-07-30 DIAGNOSIS — Z794 Long term (current) use of insulin: Secondary | ICD-10-CM | POA: Diagnosis not present

## 2018-07-30 DIAGNOSIS — K219 Gastro-esophageal reflux disease without esophagitis: Secondary | ICD-10-CM | POA: Diagnosis present

## 2018-07-30 DIAGNOSIS — Z833 Family history of diabetes mellitus: Secondary | ICD-10-CM | POA: Diagnosis not present

## 2018-07-30 DIAGNOSIS — G8929 Other chronic pain: Secondary | ICD-10-CM | POA: Diagnosis present

## 2018-07-30 DIAGNOSIS — R739 Hyperglycemia, unspecified: Secondary | ICD-10-CM | POA: Diagnosis not present

## 2018-07-30 LAB — COMPREHENSIVE METABOLIC PANEL
ALT: 24 U/L (ref 0–44)
AST: 24 U/L (ref 15–41)
Albumin: 3.4 g/dL — ABNORMAL LOW (ref 3.5–5.0)
Alkaline Phosphatase: 98 U/L (ref 38–126)
Anion gap: 12 (ref 5–15)
BUN: 18 mg/dL (ref 6–20)
CO2: 25 mmol/L (ref 22–32)
Calcium: 9.1 mg/dL (ref 8.9–10.3)
Chloride: 104 mmol/L (ref 98–111)
Creatinine, Ser: 0.81 mg/dL (ref 0.44–1.00)
GFR calc Af Amer: >60 mL/min (ref >60)
GFR calc non Af Amer: >60 mL/min (ref >60)
Glucose, Bld: 189 mg/dL — ABNORMAL HIGH (ref 70–99)
Potassium: 3.5 mmol/L (ref 3.5–5.1)
Sodium: 141 mmol/L (ref 135–145)
Total Bilirubin: 0.5 mg/dL (ref 0.3–1.2)
Total Protein: 7.6 g/dL (ref 6.5–8.1)

## 2018-07-30 LAB — GLUCOSE, CAPILLARY
Glucose-Capillary: 220 mg/dL — ABNORMAL HIGH (ref 70–99)
Glucose-Capillary: 142 mg/dL — ABNORMAL HIGH (ref 70–99)
Glucose-Capillary: 123 mg/dL — ABNORMAL HIGH (ref 70–99)
Glucose-Capillary: 98 mg/dL (ref 70–99)
Glucose-Capillary: 135 mg/dL — ABNORMAL HIGH (ref 70–99)
Glucose-Capillary: 162 mg/dL — ABNORMAL HIGH (ref 70–99)
Glucose-Capillary: 179 mg/dL — ABNORMAL HIGH (ref 70–99)
Glucose-Capillary: 213 mg/dL — ABNORMAL HIGH (ref 70–99)
Glucose-Capillary: 192 mg/dL — ABNORMAL HIGH (ref 70–99)
Glucose-Capillary: 138 mg/dL — ABNORMAL HIGH (ref 70–99)

## 2018-07-30 LAB — CBC
HCT: 32.4 % — ABNORMAL LOW (ref 36.0–46.0)
Hemoglobin: 10.8 g/dL — ABNORMAL LOW (ref 12.0–15.0)
MCH: 28.1 pg (ref 26.0–34.0)
MCHC: 33.3 g/dL (ref 30.0–36.0)
MCV: 84.2 fL (ref 80.0–100.0)
Platelets: 195 10*3/uL (ref 150–400)
RBC: 3.85 MIL/uL — ABNORMAL LOW (ref 3.87–5.11)
RDW: 12.2 % (ref 11.5–15.5)
WBC: 6.5 10*3/uL (ref 4.0–10.5)
nRBC: 0 % (ref 0.0–0.2)

## 2018-07-30 LAB — CBG MONITORING, ED: Glucose-Capillary: 275 mg/dL — ABNORMAL HIGH (ref 70–99)

## 2018-07-30 MED ORDER — HYDRALAZINE HCL 20 MG/ML IJ SOLN
10.0000 mg | Freq: Once | INTRAMUSCULAR | Status: AC
  Administered 2018-07-30: 10 mg via INTRAVENOUS
  Filled 2018-07-30: qty 1

## 2018-07-30 MED ORDER — SODIUM CHLORIDE 0.9% FLUSH
10.0000 mL | INTRAVENOUS | Status: DC | PRN
  Administered 2018-07-30: 10 mL
  Filled 2018-07-30: qty 40

## 2018-07-30 MED ORDER — INSULIN ASPART 100 UNIT/ML ~~LOC~~ SOLN: 0.0000 [IU] | Freq: Three times a day (TID) | SUBCUTANEOUS | Status: DC

## 2018-07-30 MED ORDER — INSULIN ASPART 100 UNIT/ML ~~LOC~~ SOLN
0.0000 [IU] | Freq: Three times a day (TID) | SUBCUTANEOUS | Status: DC
  Administered 2018-07-30: 2 [IU] via SUBCUTANEOUS
  Administered 2018-07-30 – 2018-07-31 (×2): 3 [IU] via SUBCUTANEOUS
  Administered 2018-07-31: 1 [IU] via SUBCUTANEOUS
  Administered 2018-08-01: 2 [IU] via SUBCUTANEOUS
  Administered 2018-08-03: 3 [IU] via SUBCUTANEOUS
  Administered 2018-08-03: 2 [IU] via SUBCUTANEOUS

## 2018-07-30 MED ORDER — LORAZEPAM 2 MG/ML IJ SOLN
1.0000 mg | Freq: Once | INTRAMUSCULAR | Status: AC
  Administered 2018-07-30: 09:00:00 1 mg via INTRAVENOUS
  Filled 2018-07-30: qty 1

## 2018-07-30 MED ORDER — PANTOPRAZOLE SODIUM 40 MG IV SOLR
40.0000 mg | INTRAVENOUS | Status: DC
  Administered 2018-07-30 – 2018-08-03 (×5): 40 mg via INTRAVENOUS
  Filled 2018-07-30 (×5): qty 40

## 2018-07-30 MED ORDER — HYDROMORPHONE HCL 1 MG/ML IJ SOLN
1.0000 mg | INTRAMUSCULAR | Status: DC | PRN
  Administered 2018-07-30 (×3): 1 mg via INTRAVENOUS
  Filled 2018-07-30 (×3): qty 1

## 2018-07-30 MED ORDER — INSULIN GLARGINE 100 UNIT/ML ~~LOC~~ SOLN
5.0000 [IU] | Freq: Every day | SUBCUTANEOUS | Status: DC
  Administered 2018-07-30 – 2018-08-03 (×5): 5 [IU] via SUBCUTANEOUS
  Filled 2018-07-30 (×6): qty 0.05

## 2018-07-30 MED ORDER — BOOST / RESOURCE BREEZE PO LIQD CUSTOM
1.0000 | Freq: Three times a day (TID) | ORAL | Status: DC
  Administered 2018-07-30 – 2018-07-31 (×3): 1 via ORAL

## 2018-07-30 MED ORDER — PROMETHAZINE HCL 25 MG PO TABS
12.5000 mg | ORAL_TABLET | Freq: Four times a day (QID) | ORAL | Status: DC | PRN
  Filled 2018-07-30: qty 1

## 2018-07-30 MED ORDER — INSULIN ASPART 100 UNIT/ML ~~LOC~~ SOLN: 0.0000 [IU] | Freq: Every day | SUBCUTANEOUS | Status: DC

## 2018-07-30 MED ORDER — KETOROLAC TROMETHAMINE 30 MG/ML IJ SOLN
30.0000 mg | Freq: Once | INTRAMUSCULAR | Status: AC
  Administered 2018-07-30: 30 mg via INTRAVENOUS
  Filled 2018-07-30: qty 1

## 2018-07-30 MED ORDER — METOCLOPRAMIDE HCL 5 MG/ML IJ SOLN
10.0000 mg | Freq: Four times a day (QID) | INTRAMUSCULAR | Status: DC
  Administered 2018-07-30 – 2018-08-03 (×17): 10 mg via INTRAVENOUS
  Filled 2018-07-30 (×18): qty 2

## 2018-07-30 MED ORDER — FENTANYL CITRATE (PF) 100 MCG/2ML IJ SOLN
25.0000 ug | INTRAMUSCULAR | Status: DC | PRN
  Administered 2018-07-30 (×3): 25 ug via INTRAVENOUS
  Filled 2018-07-30 (×3): qty 2

## 2018-07-30 MED ORDER — SODIUM CHLORIDE 0.9 % IV SOLN
INTRAVENOUS | Status: DC
  Administered 2018-07-30 – 2018-08-02 (×8): via INTRAVENOUS

## 2018-07-30 MED ORDER — HYDROMORPHONE HCL 1 MG/ML IJ SOLN
0.5000 mg | INTRAMUSCULAR | Status: DC | PRN
  Administered 2018-07-30 – 2018-08-02 (×20): 0.5 mg via INTRAVENOUS
  Filled 2018-07-30 (×20): qty 0.5

## 2018-07-30 MED ORDER — PROMETHAZINE HCL 25 MG/ML IJ SOLN
12.5000 mg | Freq: Once | INTRAMUSCULAR | Status: AC
  Administered 2018-07-30: 12.5 mg via INTRAVENOUS
  Filled 2018-07-30: qty 1

## 2018-07-30 MED ORDER — ENOXAPARIN SODIUM 40 MG/0.4ML ~~LOC~~ SOLN
40.0000 mg | SUBCUTANEOUS | Status: DC
  Administered 2018-07-30 – 2018-08-03 (×5): 40 mg via SUBCUTANEOUS
  Filled 2018-07-30 (×5): qty 0.4

## 2018-07-30 MED ORDER — SODIUM CHLORIDE 0.9 % IV SOLN
INTRAVENOUS | Status: DC
  Administered 2018-07-30: 02:00:00 via INTRAVENOUS

## 2018-07-30 MED ORDER — PROMETHAZINE HCL 25 MG/ML IJ SOLN
25.0000 mg | Freq: Four times a day (QID) | INTRAMUSCULAR | Status: DC
  Administered 2018-07-30 – 2018-08-03 (×17): 25 mg via INTRAVENOUS
  Filled 2018-07-30 (×17): qty 1

## 2018-07-30 MED ORDER — HYDROCODONE-ACETAMINOPHEN 5-325 MG PO TABS
2.0000 | ORAL_TABLET | Freq: Once | ORAL | Status: DC
  Filled 2018-07-30: qty 2

## 2018-07-30 NOTE — Progress Notes (Signed)
BMP results available in Epic, pt on insulin drip, no other BMP ordered. Schorr, NP notified.

## 2018-07-30 NOTE — Progress Notes (Signed)
Pt c/o mid back pain 10/10, asking for ice chips. BP 179/111, HR 112. Schorr, NP paged.

## 2018-07-30 NOTE — Progress Notes (Signed)
PROGRESS NOTE    [name]   FIE:[id number]  DOB: [birth year]  DOA: 07/29/2018 PCP: [name], MD   Brief Narrative:  [name]  is a 28 y.o. female with medical history significant of recurrent cyclical vomiting due to diabetic gastroparesis, type 1 diabetes, fibromyalgia, anxiety disorder, GERD who was last admitted to the hospital on June 04, 2018 with intractable vomiting.  she presents again for the same and states she has abdominal pain, nausea and vomiting for 24 hrs.    Subjective: She is having severe abdominal and mid back pain along with persistent nausea and vomiting. Asking for more medications.     Assessment & Plan:   Principal Problem:   Diabetic gastroparesis associated with type 1 diabetes mellitus - she has ongoing nausea, vomiting and abdominal pain - will change phenergan to IV make it QID routine - add Ativan to help with nausea and anxiety - add Dilaudid to help with pain but use sparingly- d/w RN - IF QTc remains stable, can add Reglan  Active Problems:  Dehydration - has been given IVF, will continue    Hypertension associated with diabetes    - Losartan - d/c Lasix    Diabetes mellitus type 1  - she was on an insulin infusion which I have discontinued     Time spent in minutes:  35 DVT prophylaxis: Lovenox Code Status: Full code Family Communication:  Disposition Plan: home Consultants:   none Procedures:   none Antimicrobials:  Anti-infectives (From admission, onward)   None       Objective: Vitals:   07/29/18 2330 07/30/18 0151 07/30/18 0247 07/30/18 1032  BP: (!) 166/99 (!) 179/111 (!) 144/95   Pulse: (!) 106  (!) 110   Resp:   16   Temp:   97.7 F (36.5 C)   TempSrc:   Oral   SpO2: 92%  100%   Weight:    64.5 kg  Height:    5' 7"  (1.702 m)    Intake/Output Summary (Last 24 hours) at 07/30/2018 1231 Last data filed at 07/30/2018 0300 Gross per 24 hour  Intake 2654.7 ml  Output -  Net 2654.7 ml    Filed Weights   07/30/18 1032  Weight: 64.5 kg    Examination: General exam: Appears comfortable  HEENT: PERRLA, oral mucosa moist, no sclera icterus or thrush Respiratory system: Clear to auscultation. Respiratory effort normal. Cardiovascular system: S1 & S2 heard, RRR.   Gastrointestinal system: Abdomen soft,  Tender in mid abdomen, nondistended. Normal bowel sounds. Central nervous system: Alert and oriented. No focal neurological deficits. Extremities: No cyanosis, clubbing or edema Skin: No rashes or ulcers Psychiatry:  Mood & affect appropriate.     Data Reviewed: I have personally reviewed following labs and imaging studies  CBC: Recent Labs  Lab 07/29/18 2108 07/30/18 0233  WBC 4.3 6.5  NEUTROABS 2.9  --   HGB 11.7* 10.8*  HCT 35.4* 32.4*  MCV 84.9 84.2  PLT 212 166   Basic Metabolic Panel: Recent Labs  Lab 07/29/18 2108 07/30/18 0233  NA 136 141  K 4.2 3.5  CL 97* 104  CO2 24 25  GLUCOSE 429* 189*  BUN 23* 18  CREATININE 0.88 0.81  CALCIUM 9.6 9.1  MG 2.1  --    GFR: Estimated Creatinine Clearance: 100.6 mL/min (by C-G formula based on SCr of 0.81 mg/dL). Liver Function Tests: Recent Labs  Lab 07/29/18 2108 07/30/18 0233  AST 20 24  ALT 25 24  ALKPHOS 109 98  BILITOT 0.6 0.5  PROT 8.0 7.6  ALBUMIN 3.7 3.4*   Recent Labs  Lab 07/29/18 2108  LIPASE 24   No results for input(s): AMMONIA in the last 168 hours. Coagulation Profile: No results for input(s): INR, PROTIME in the last 168 hours. Cardiac Enzymes: No results for input(s): CKTOTAL, CKMB, CKMBINDEX, TROPONINI in the last 168 hours. BNP (last 3 results) No results for input(s): PROBNP in the last 8760 hours. HbA1C: No results for input(s): HGBA1C in the last 72 hours. CBG: Recent Labs  Lab 07/30/18 0447 07/30/18 0551 07/30/18 0652 07/30/18 0753 07/30/18 1145  GLUCAP 98 135* 162* 179* 213*   Lipid Profile: No results for input(s): CHOL, HDL, LDLCALC, TRIG, CHOLHDL,  LDLDIRECT in the last 72 hours. Thyroid Function Tests: No results for input(s): TSH, T4TOTAL, FREET4, T3FREE, THYROIDAB in the last 72 hours. Anemia Panel: No results for input(s): VITAMINB12, FOLATE, FERRITIN, TIBC, IRON, RETICCTPCT in the last 72 hours. Urine analysis:    Component Value Date/Time   COLORURINE YELLOW 06/04/2018 1209   APPEARANCEUR CLEAR 06/04/2018 1209   LABSPEC 1.020 06/04/2018 1209   PHURINE 7.0 06/04/2018 1209   GLUCOSEU >=500 (A) 06/04/2018 1209   HGBUR SMALL (A) 06/04/2018 1209   BILIRUBINUR NEGATIVE 06/04/2018 1209   BILIRUBINUR negative 01/02/2015 1717   KETONESUR 20 (A) 06/04/2018 1209   PROTEINUR 100 (A) 06/04/2018 1209   UROBILINOGEN 0.2 01/13/2015 0223   NITRITE NEGATIVE 06/04/2018 1209   LEUKOCYTESUR NEGATIVE 06/04/2018 1209   Sepsis Labs: @LABRCNTIP (procalcitonin:4,lacticidven:4) )No results found for this or any previous visit (from the past 240 hour(s)).       Radiology Studies: Dg Chest Port 1 View  Result Date: 07/29/2018 CLINICAL DATA:  Diabetic patient.  Unresponsive. EXAM: PORTABLE CHEST 1 VIEW COMPARISON:  None. FINDINGS: The heart size and mediastinal contours are within normal limits. Both lungs are clear. The visualized skeletal structures are unremarkable. IMPRESSION: No active disease. Electronically Signed   By: Nelson Chimes M.D.   On: 07/29/2018 21:20      Scheduled Meds: . enoxaparin (LOVENOX) injection  40 mg Subcutaneous Q24H  . feeding supplement  1 Container Oral TID BM  . HYDROcodone-acetaminophen  2 tablet Oral Once  . insulin aspart  0-5 Units Subcutaneous QHS  . insulin aspart  0-9 Units Subcutaneous TID WC  . insulin glargine  5 Units Subcutaneous Daily  . metoCLOPramide (REGLAN) injection  10 mg Intravenous Q6H  . pantoprazole (PROTONIX) IV  40 mg Intravenous Q24H  . promethazine  25 mg Intravenous Q6H   Continuous Infusions: . sodium chloride 125 mL/hr at 07/30/18 1103  . insulin Stopped (07/30/18 0853)      LOS: 0 days      Debbe Odea, MD [facility] Pager: [email or web address] Password [facility] 07/30/2018, 12:31 PM

## 2018-07-30 NOTE — TOC Initial Note (Signed)
Transition of Care [facility]) - Initial/Assessment Note    Patient Details  Name: SANNIE SEVICK MRN: [id number] Date of Birth: 1990-07-20  [facility]) CM/SW Contact:    Leone Haven, RN Phone Number: 07/30/2018, 2:14 PM  Clinical Narrative:                 From home with spouse, she has PCP, she states she sometimes has problems getting her CBM supplies for diabetes, she states she pays a co pay for her medications.  She has transportation at discharge. NCM will follow along for TOC needs.  Expected Discharge Plan: Home/Self Care Barriers to Discharge: No Barriers Identified   Patient Goals and CMS Choice Patient states their goals for this hospitalization and ongoing recovery are:: to keep stomach pains under control so she can eat   Choice offered to / list presented to : NA  Expected Discharge Plan and Services Expected Discharge Plan: Home/Self Care In-house Referral: NA Discharge Planning Services: CM Consult Post Acute Care Choice: NA Living arrangements for the past 2 months: Single Family Home                 DME Arranged: N/A DME Agency: NA                  Prior Living Arrangements/Services Living arrangements for the past 2 months: Single Family Home Lives with:: Spouse Patient language and need for interpreter reviewed:: Yes Do you feel safe going back to the place where you live?: Yes      Need for Family Participation in Patient Care: No (Comment) Care giver support system in place?: No (comment)   Criminal Activity/Legal Involvement Pertinent to Current Situation/Hospitalization: No - Comment as needed  Activities of Daily Living Home Assistive Devices/Equipment: None ADL Screening (condition at time of admission) Patient's cognitive ability adequate to safely complete daily activities?: Yes Is the patient deaf or have difficulty hearing?: No Does the patient have difficulty seeing, even when wearing glasses/contacts?: No Does  the patient have difficulty concentrating, remembering, or making decisions?: No Patient able to express need for assistance with ADLs?: Yes Does the patient have difficulty dressing or bathing?: No Independently performs ADLs?: Yes (appropriate for developmental age) Does the patient have difficulty walking or climbing stairs?: Yes Weakness of Legs: Both Weakness of Arms/Hands: None  Permission Sought/Granted                  Emotional Assessment   Attitude/Demeanor/Rapport: Engaged Affect (typically observed): Accepting, Appropriate Orientation: : Oriented to Self, Oriented to Place, Oriented to  Time, Oriented to Situation   Psych Involvement: No (comment)  Admission diagnosis:  Gastroparesis [K31.84] Hyperglycemia [R73.9] Type 1 diabetes mellitus with other specified complication (HCC) [E10.69] Patient Active Problem List   Diagnosis Date Noted  . Intractable vomiting with nausea 07/30/2018  . Acute metabolic encephalopathy 07/29/2018  . Intractable vomiting   . Cyclical vomiting syndrome 06/07/2018  . Diabetic gastroparesis associated with type 1 diabetes mellitus (HCC) 06/04/2018  . Anxiety 05/31/2018  . Peripheral edema 05/31/2018  . Normocytic anemia 07/10/2017  . GERD (gastroesophageal reflux disease) 06/23/2017  . 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 Diabetes mellitus type 1 (HCC) 04/28/2013  . Protein-calorie malnutrition, severe (HCC) 04/28/2013  . Hypertension associated with diabetes (HCC) 11/14/2011  . Gastroparesis 11/14/2011  . Abdominal pain 09/18/2011   PCP:  Ardith Dark, MD Pharmacy:   [facility] [address], [place] -  [address] [place] [address] Phone: [phone] Fax: [phone]     Social Determinants of Health (SDOH) Interventions    Readmission Risk Interventions Readmission Risk Prevention Plan 07/30/2018 06/09/2018  Transportation Screening  Complete Complete  Social Work Consult for Recovery Care Planning/Counseling Not Complete -  SW consult not completed comments NA -  Palliative Care Screening Not Applicable -  Medication Review Oceanographer) - Complete  PCP or Specialist appointment within 3-5 days of discharge - Complete  HRI or Home Care Consult - Complete  SW Recovery Care/Counseling Consult - Complete  Palliative Care Screening - Not Applicable  Skilled Nursing Facility - Not Applicable  Some recent data might be hidden

## 2018-07-30 NOTE — Progress Notes (Signed)
Initial Nutrition Assessment   RD working remotely  DOCUMENTATION CODES:   Not applicable  INTERVENTION:    Boost Breeze po TID, each supplement provides 250 kcal and 9 grams of protein  NUTRITION DIAGNOSIS:   Increased nutrient needs related to acute illness, chronic illness as evidenced by estimated needs  GOAL:   Patient will meet greater than or equal to 90% of their needs  MONITOR:   PO intake, Supplement acceptance, Labs, Skin, Weight trends, I & O's  REASON FOR ASSESSMENT:   Malnutrition Screening Tool  ASSESSMENT:   27 y.o. Female with PMH significant of recurrent cyclical vomiting due to diabetic gastroparesis, Type 1 DM, fibromyalgia, anxiety disorder, GERD who was last admitted to the hospital on June 04, 2018 with intractable vomiting.  Admit dx include: Gastroparesis [K31.84] Hyperglycemia [R73.9] Type 1 diabetes mellitus with other specified complication (HCC) [E10.69]  RD unable to obtain nutrition hx from pt. Attempted to call room phone however no answer.  Pt known to Clinical Nutrition; assessed per RD at [facility] 05/12/18. She has had multiple admissions over past year for same symptoms. Moderate malnutrition identified upon RD assessment.   Currently on Clear Liquids; will add clear liquid supplement. Per readings below, pt's weight has fluctuated up and down. Labs & medications reviewed.  CBG's [id number].  NUTRITION - FOCUSED PHYSICAL EXAM:  Unable to complete at this time, however, suspect malnutrition ongoing  Diet Order:   Diet Order            Diet clear liquid Room service appropriate? Yes; Fluid consistency: Thin  Diet effective now             EDUCATION NEEDS:   Not appropriate for education at this time  Skin:  Skin Assessment: Reviewed RN Assessment  Last BM:  4/30  Height:   Ht Readings from Last 1 Encounters:  07/30/18 5\' 7"  (1.702 m)   Weight:   Wt Readings from Last 1 Encounters:  07/30/18 64.5 kg   Wt  Readings from Last 10 Encounters:  07/30/18 64.5 kg  06/18/18 64.5 kg  06/04/18 57.7 kg  06/03/18 58.3 kg  05/31/18 67.7 kg  05/27/18 68.5 kg  05/18/18 65.7 kg  04/01/18 62.2 kg  03/26/18 54.4 kg  03/22/18 56.3 kg   BMI:  Body mass index is 22.27 kg/m.  Estimated Nutritional Needs:   Kcal:  1800-2000  Protein:  90-105 gm  Fluid:  1.8-2.0 L  Maureen Chatters, RD, LDN Pager #: [phone] After-Hours Pager #: [phone]

## 2018-07-30 NOTE — Progress Notes (Signed)
Inpatient Diabetes Program Recommendations  AACE/ADA: New Consensus Statement on Inpatient Glycemic Control (2015)  Target Ranges:  Prepandial:   less than 140 mg/dL      Peak postprandial:   less than 180 mg/dL (1-2 hours)      Critically ill patients:  140 - 180 mg/dL   Lab Results  Component Value Date   GLUCAP 213 (H) 07/30/2018   HGBA1C 10.0 (H) 06/09/2018    Review of Glycemic Control  Diabetes history: DM 1 Outpatient Diabetes medications: Lantus 5 units, Humalog 4 units tid Current orders for Inpatient glycemic control: Lantus 5 units, Novolog 0-9 units tid, Novolog 0-5 units qhs.   On Clear liquid diet. Ordering first tray now. Attached sick day guidelines to d/c paperwork.   Inpatient Diabetes Program Recommendations:    Spoke with patient over the phone in regards to sick day guidelines and glucose control at home. Patient sees Dr. Everardo All, Endocrinologist outpatient for glucose control and last saw him in March. Patient reports having a sick day plan. Patient reports se is very sensitive to insulin and reports that most of the time when she feels sick she does take a portion of her long acting insulin, however she did not take her insulin yesterday. Patient sees Dr. Everardo All Every 3 months.  Thanks,  Christena Deem RN, MSN, BC-ADM Inpatient Diabetes Coordinator Team Pager [phone] (8a-5p)

## 2018-07-30 NOTE — Progress Notes (Signed)
Pt arrived to 5w32, alert and oriented x 4, pt identified appropriately, no signs of acute distress. Physical assessment completed, pt placed on cardiac monitor and CCMD notified. Pt in pain. Insulin IV infusing at 4.8 at this time.  Oriented to room and equipment, instructed to call for assistance and how to use call bell, same left within reach.  Will continue to monitor and treat pt per MD orders.

## 2018-07-30 NOTE — Progress Notes (Signed)
Last CBG 98, insulin drip rate 0.4. Schorr, Np notified per protocol.

## 2018-07-31 LAB — BASIC METABOLIC PANEL
Anion gap: 9 (ref 5–15)
BUN: 20 mg/dL (ref 6–20)
CO2: 24 mmol/L (ref 22–32)
Calcium: 8.3 mg/dL — ABNORMAL LOW (ref 8.9–10.3)
Chloride: 108 mmol/L (ref 98–111)
Creatinine, Ser: 0.64 mg/dL (ref 0.44–1.00)
GFR calc Af Amer: >60 mL/min (ref >60)
GFR calc non Af Amer: >60 mL/min (ref >60)
Glucose, Bld: 169 mg/dL — ABNORMAL HIGH (ref 70–99)
Potassium: 3 mmol/L — ABNORMAL LOW (ref 3.5–5.1)
Sodium: 141 mmol/L (ref 135–145)

## 2018-07-31 LAB — GLUCOSE, CAPILLARY
Glucose-Capillary: 215 mg/dL — ABNORMAL HIGH (ref 70–99)
Glucose-Capillary: 137 mg/dL — ABNORMAL HIGH (ref 70–99)
Glucose-Capillary: 121 mg/dL — ABNORMAL HIGH (ref 70–99)
Glucose-Capillary: 113 mg/dL — ABNORMAL HIGH (ref 70–99)

## 2018-07-31 MED ORDER — LORAZEPAM 2 MG/ML IJ SOLN
0.5000 mg | Freq: Four times a day (QID) | INTRAMUSCULAR | Status: DC | PRN
  Administered 2018-08-01 – 2018-08-02 (×2): 0.5 mg via INTRAVENOUS
  Filled 2018-07-31 (×2): qty 1

## 2018-07-31 MED ORDER — GLUCERNA SHAKE PO LIQD
237.0000 mL | Freq: Three times a day (TID) | ORAL | Status: DC
  Administered 2018-07-31 – 2018-08-03 (×5): 237 mL via ORAL

## 2018-07-31 MED ORDER — DICLOFENAC SODIUM 1 % TD GEL
2.0000 g | Freq: Four times a day (QID) | TRANSDERMAL | Status: DC
  Administered 2018-07-31 (×2): 2 g via TOPICAL
  Filled 2018-07-31: qty 100

## 2018-07-31 NOTE — Progress Notes (Signed)
PROGRESS NOTE    [name]   SEG:[id number]  DOB: 1991-03-18  DOA: 07/29/2018 PCP: [name], MD   Brief Narrative:  [name]  is a 28 y.o. female with medical history significant of recurrent cyclical vomiting due to diabetic gastroparesis, type 1 diabetes, fibromyalgia, anxiety disorder, GERD who was last admitted to the hospital on June 04, 2018 with intractable vomiting.  she presents again for the same and states she has abdominal pain, nausea and vomiting for 24 hrs.    Subjective: She is having back pain today in between her shoulder blades. She has a popsicle this AM and vomited it.     Assessment & Plan:   Principal Problem:   Diabetic gastroparesis associated with type 1 diabetes mellitus - last A1c on 06/09/18 was 10.0 - she has ongoing nausea, vomiting and abdominal pain - cont Phenergan and Reglan to IV QID routine - added Ativan to help with nausea and anxiety - cont Dilaudid o.5 mg to help with pain but use sparingly     Active Problems:  Dehydration - cont IVF     Hypertension associated with diabetes    - Losartan - d/c Lasix while unable to eat/drink    Diabetes mellitus type 1  - she was on an insulin infusion which I have discontinued - now on Lantus and SSI     Time spent in minutes:  35 DVT prophylaxis: Lovenox Code Status: Full code Family Communication:  Disposition Plan: home Consultants:   none Procedures:   none Antimicrobials:  Anti-infectives (From admission, onward)   None       Objective: Vitals:   07/30/18 1413 07/30/18 2136 07/31/18 0530 07/31/18 0604  BP: 111/82 (!) 149/99 (!) 154/95   Pulse: (!) 115 (!) 113 (!) 114   Resp: 18 18 16    Temp: 98.2 F (36.8 C) 98.1 F (36.7 C) 98.7 F (37.1 C)   TempSrc: Oral Oral Oral   SpO2: 100% 100% 100%   Weight:    61.4 kg  Height:        Intake/Output Summary (Last 24 hours) at 07/31/2018 0902 Last data filed at 07/31/2018 0535 Gross per 24 hour  Intake  1900 ml  Output -  Net 1900 ml   Filed Weights   07/30/18 1032 07/31/18 0604  Weight: 64.5 kg 61.4 kg    Examination: General exam: Appears uncomfortable  HEENT: PERRLA, oral mucosa moist, no sclera icterus or thrush Respiratory system: Clear to auscultation. Respiratory effort normal. Cardiovascular system: S1 & S2 heard,  No murmurs  Gastrointestinal system: Abdomen soft, tender across upper abdomen, nondistended. Normal bowel sounds   Central nervous system: Alert and oriented. No focal neurological deficits. Extremities: No cyanosis, clubbing or edema Skin: No rashes or ulcers Psychiatry:  crying, depressed and anxious    Data Reviewed: I have personally reviewed following labs and imaging studies  CBC: Recent Labs  Lab 07/29/18 2108 07/30/18 0233  WBC 4.3 6.5  NEUTROABS 2.9  --   HGB 11.7* 10.8*  HCT 35.4* 32.4*  MCV 84.9 84.2  PLT 212 737   Basic Metabolic Panel: Recent Labs  Lab 07/29/18 2108 07/30/18 0233  NA 136 141  K 4.2 3.5  CL 97* 104  CO2 24 25  GLUCOSE 429* 189*  BUN 23* 18  CREATININE 0.88 0.81  CALCIUM 9.6 9.1  MG 2.1  --    GFR: Estimated Creatinine Clearance: 100.2 mL/min (by C-G formula based on SCr of 0.81  mg/dL). Liver Function Tests: Recent Labs  Lab 07/29/18 2108 07/30/18 0233  AST 20 24  ALT 25 24  ALKPHOS 109 98  BILITOT 0.6 0.5  PROT 8.0 7.6  ALBUMIN 3.7 3.4*   Recent Labs  Lab 07/29/18 2108  LIPASE 24   No results for input(s): AMMONIA in the last 168 hours. Coagulation Profile: No results for input(s): INR, PROTIME in the last 168 hours. Cardiac Enzymes: No results for input(s): CKTOTAL, CKMB, CKMBINDEX, TROPONINI in the last 168 hours. BNP (last 3 results) No results for input(s): PROBNP in the last 8760 hours. HbA1C: No results for input(s): HGBA1C in the last 72 hours. CBG: Recent Labs  Lab 07/30/18 0753 07/30/18 1145 07/30/18 1653 07/30/18 2136 07/31/18 0801  GLUCAP 179* 213* 192* 138* 215*    Lipid Profile: No results for input(s): CHOL, HDL, LDLCALC, TRIG, CHOLHDL, LDLDIRECT in the last 72 hours. Thyroid Function Tests: No results for input(s): TSH, T4TOTAL, FREET4, T3FREE, THYROIDAB in the last 72 hours. Anemia Panel: No results for input(s): VITAMINB12, FOLATE, FERRITIN, TIBC, IRON, RETICCTPCT in the last 72 hours. Urine analysis:    Component Value Date/Time   COLORURINE YELLOW 06/04/2018 1209   APPEARANCEUR CLEAR 06/04/2018 1209   LABSPEC 1.020 06/04/2018 1209   PHURINE 7.0 06/04/2018 1209   GLUCOSEU >=500 (A) 06/04/2018 1209   HGBUR SMALL (A) 06/04/2018 1209   BILIRUBINUR NEGATIVE 06/04/2018 1209   BILIRUBINUR negative 01/02/2015 1717   KETONESUR 20 (A) 06/04/2018 1209   PROTEINUR 100 (A) 06/04/2018 1209   UROBILINOGEN 0.2 01/13/2015 0223   NITRITE NEGATIVE 06/04/2018 1209   LEUKOCYTESUR NEGATIVE 06/04/2018 1209   Sepsis Labs: @LABRCNTIP (procalcitonin:4,lacticidven:4) )No results found for this or any previous visit (from the past 240 hour(s)).       Radiology Studies: Dg Chest Port 1 View  Result Date: 07/29/2018 CLINICAL DATA:  Diabetic patient.  Unresponsive. EXAM: PORTABLE CHEST 1 VIEW COMPARISON:  None. FINDINGS: The heart size and mediastinal contours are within normal limits. Both lungs are clear. The visualized skeletal structures are unremarkable. IMPRESSION: No active disease. Electronically Signed   By: Nelson Chimes M.D.   On: 07/29/2018 21:20      Scheduled Meds: . enoxaparin (LOVENOX) injection  40 mg Subcutaneous Q24H  . feeding supplement  1 Container Oral TID BM  . HYDROcodone-acetaminophen  2 tablet Oral Once  . insulin aspart  0-5 Units Subcutaneous QHS  . insulin aspart  0-9 Units Subcutaneous TID WC  . insulin glargine  5 Units Subcutaneous Daily  . metoCLOPramide (REGLAN) injection  10 mg Intravenous Q6H  . pantoprazole (PROTONIX) IV  40 mg Intravenous Q24H  . promethazine  25 mg Intravenous Q6H   Continuous Infusions: .  sodium chloride 125 mL/hr at 07/31/18 0118  . insulin Stopped (07/30/18 0853)     LOS: 1 day      Debbe Odea, MD [facility] Pager: [email or web address] Password [email or web address] 07/31/2018, 9:02 AM

## 2018-07-31 NOTE — Plan of Care (Signed)

## 2018-08-01 LAB — BASIC METABOLIC PANEL WITH GFR
Anion gap: 10 (ref 5–15)
BUN: 10 mg/dL (ref 6–20)
CO2: 25 mmol/L (ref 22–32)
Calcium: 8 mg/dL — ABNORMAL LOW (ref 8.9–10.3)
Chloride: 105 mmol/L (ref 98–111)
Creatinine, Ser: 0.62 mg/dL (ref 0.44–1.00)
GFR calc Af Amer: >60 mL/min
GFR calc non Af Amer: >60 mL/min
Glucose, Bld: 115 mg/dL — ABNORMAL HIGH (ref 70–99)
Potassium: 2.8 mmol/L — ABNORMAL LOW (ref 3.5–5.1)
Sodium: 140 mmol/L (ref 135–145)

## 2018-08-01 LAB — MAGNESIUM: Magnesium: 1.8 mg/dL (ref 1.7–2.4)

## 2018-08-01 LAB — GLUCOSE, CAPILLARY
Glucose-Capillary: 113 mg/dL — ABNORMAL HIGH (ref 70–99)
Glucose-Capillary: 112 mg/dL — ABNORMAL HIGH (ref 70–99)
Glucose-Capillary: 151 mg/dL — ABNORMAL HIGH (ref 70–99)
Glucose-Capillary: 91 mg/dL (ref 70–99)

## 2018-08-01 MED ORDER — POTASSIUM CHLORIDE 10 MEQ/100ML IV SOLN
10.0000 meq | INTRAVENOUS | Status: AC
  Administered 2018-08-01 (×6): 10 meq via INTRAVENOUS
  Filled 2018-08-01 (×6): qty 100

## 2018-08-01 MED ORDER — HYDRALAZINE HCL 20 MG/ML IJ SOLN
10.0000 mg | Freq: Four times a day (QID) | INTRAMUSCULAR | Status: DC | PRN
  Administered 2018-08-01 – 2018-08-02 (×3): 10 mg via INTRAVENOUS
  Filled 2018-08-01 (×3): qty 1

## 2018-08-01 MED ORDER — DICYCLOMINE HCL 20 MG PO TABS
20.0000 mg | ORAL_TABLET | Freq: Three times a day (TID) | ORAL | Status: DC
  Administered 2018-08-01 – 2018-08-03 (×8): 20 mg via ORAL
  Filled 2018-08-01 (×8): qty 1

## 2018-08-01 MED ORDER — MIRTAZAPINE 15 MG PO TABS
30.0000 mg | ORAL_TABLET | Freq: Every day | ORAL | Status: DC
  Administered 2018-08-01 – 2018-08-02 (×2): 30 mg via ORAL
  Filled 2018-08-01 (×2): qty 2

## 2018-08-01 MED ORDER — MUSCLE RUB 10-15 % EX CREA
TOPICAL_CREAM | CUTANEOUS | Status: DC | PRN
  Administered 2018-08-01: 15:00:00 via TOPICAL
  Filled 2018-08-01: qty 85

## 2018-08-01 MED ORDER — DICYCLOMINE HCL 20 MG PO TABS: 20.0000 mg | ORAL_TABLET | Freq: Three times a day (TID) | ORAL | Status: DC | PRN

## 2018-08-01 MED ORDER — KETOROLAC TROMETHAMINE 30 MG/ML IJ SOLN
30.0000 mg | Freq: Four times a day (QID) | INTRAMUSCULAR | Status: DC
  Administered 2018-08-01 – 2018-08-02 (×4): 30 mg via INTRAVENOUS
  Filled 2018-08-01 (×4): qty 1

## 2018-08-01 NOTE — Progress Notes (Signed)
Pt BP 172/104. Pt HR has sustained in 110s since yesterday. Pt in pain, but pain medicine not due. On night coverage NP, Bodenheimer, made aware. Awaiting orders. Will continue to monitor and treat per orders.

## 2018-08-01 NOTE — Progress Notes (Addendum)
PROGRESS NOTE    [name]   FXT:[id number]  DOB: 1991-03-29  DOA: 07/29/2018 PCP: [name], MD   Brief Narrative:  [name]  is a 28 y.o. female with medical history significant of recurrent cyclical vomiting due to diabetic gastroparesis, type 1 diabetes, fibromyalgia, anxiety disorder, GERD who was last admitted to the hospital on June 04, 2018 with intractable vomiting.  she presents again for the same and states she has abdominal pain, nausea and vomiting for 24 hrs.    Subjective: She continues to have upper back pain in between her shoulder blades. She is beginning to drink more but is occasionally vomiting. She does not feel ready to advance to solids yet.     Assessment & Plan:   Principal Problem:   Diabetic gastroparesis associated with type 1 diabetes mellitus - last A1c on 06/09/18 was 10.0 - she has ongoing nausea, vomiting and abdominal pain - cont Phenergan and Reglan to IV QID routine - cont IVF - added Ativan to help with nausea and anxiety - cont Dilaudid 0.5 mg to help with pain but use sparingly    Active Problems:  Dehydration - cont IVF   Severe hypokalemia - K 2.8 today- will need to replace via IV to decrease the risk of vomiting  Upper back pain - has likely strained her muscles while vomiting- she states she is not able to tolerate Voltaren gel as it irritates her skin - try muscle rub - add IV Toradol    Hypertension associated with diabetes    - Losartan - d/c Lasix while unable to eat/drink    Diabetes mellitus type 1  - she was on an insulin infusion which I have discontinued - now on Lantus and SSI   Time spent in minutes:  35 DVT prophylaxis: Lovenox Code Status: Full code Family Communication:  Disposition Plan: home Consultants:   none Procedures:   none Antimicrobials:  Anti-infectives (From admission, onward)   None       Objective: Vitals:   08/01/18 0232 08/01/18 0527 08/01/18 0528 08/01/18  0629  BP: (!) 150/97 (!) 172/104  (!) 169/101  Pulse: (!) 112 (!) 110    Resp: 18 18    Temp: 98.2 F (36.8 C) 98.5 F (36.9 C)    TempSrc: Oral Oral    SpO2: 99% 100%    Weight:   60.1 kg   Height:        Intake/Output Summary (Last 24 hours) at 08/01/2018 1128 Last data filed at 07/31/2018 1700 Gross per 24 hour  Intake 600 ml  Output -  Net 600 ml   Filed Weights   07/30/18 1032 07/31/18 0604 08/01/18 0528  Weight: 64.5 kg 61.4 kg 60.1 kg    Examination: General exam: Appears comfortable  HEENT: PERRLA, oral mucosa moist, no sclera icterus or thrush Respiratory system: Clear to auscultation. Respiratory effort normal. Cardiovascular system: S1 & S2 heard,  No murmurs  MSK: tender in upper back lateral to the spine Gastrointestinal system: Abdomen soft, non-tender, nondistended. Normal bowel sounds   Central nervous system: Alert and oriented. No focal neurological deficits. Extremities: No cyanosis, clubbing or edema Skin: No rashes or ulcers Psychiatry:  Mood & affect appropriate.     Data Reviewed: I have personally reviewed following labs and imaging studies  CBC: Recent Labs  Lab 07/29/18 2108 07/30/18 0233  WBC 4.3 6.5  NEUTROABS 2.9  --   HGB 11.7* 10.8*  HCT 35.4* 32.4*  MCV 84.9 84.2  PLT 212 253   Basic Metabolic Panel: Recent Labs  Lab 07/29/18 2108 07/30/18 0233 07/31/18 1036 08/01/18 0505  NA 136 141 141 140  K 4.2 3.5 3.0* 2.8*  CL 97* 104 108 105  CO2 24 25 24 25   GLUCOSE 429* 189* 169* 115*  BUN 23* 18 20 10   CREATININE 0.88 0.81 0.64 0.62  CALCIUM 9.6 9.1 8.3* 8.0*  MG 2.1  --   --  1.8   GFR: Estimated Creatinine Clearance: 99.3 mL/min (by C-G formula based on SCr of 0.62 mg/dL). Liver Function Tests: Recent Labs  Lab 07/29/18 2108 07/30/18 0233  AST 20 24  ALT 25 24  ALKPHOS 109 98  BILITOT 0.6 0.5  PROT 8.0 7.6  ALBUMIN 3.7 3.4*   Recent Labs  Lab 07/29/18 2108  LIPASE 24   No results for input(s): AMMONIA in  the last 168 hours. Coagulation Profile: No results for input(s): INR, PROTIME in the last 168 hours. Cardiac Enzymes: No results for input(s): CKTOTAL, CKMB, CKMBINDEX, TROPONINI in the last 168 hours. BNP (last 3 results) No results for input(s): PROBNP in the last 8760 hours. HbA1C: No results for input(s): HGBA1C in the last 72 hours. CBG: Recent Labs  Lab 07/31/18 0801 07/31/18 1158 07/31/18 1659 07/31/18 2119 08/01/18 0841  GLUCAP 215* 137* 121* 113* 113*   Lipid Profile: No results for input(s): CHOL, HDL, LDLCALC, TRIG, CHOLHDL, LDLDIRECT in the last 72 hours. Thyroid Function Tests: No results for input(s): TSH, T4TOTAL, FREET4, T3FREE, THYROIDAB in the last 72 hours. Anemia Panel: No results for input(s): VITAMINB12, FOLATE, FERRITIN, TIBC, IRON, RETICCTPCT in the last 72 hours. Urine analysis:    Component Value Date/Time   COLORURINE YELLOW 06/04/2018 1209   APPEARANCEUR CLEAR 06/04/2018 1209   LABSPEC 1.020 06/04/2018 1209   PHURINE 7.0 06/04/2018 1209   GLUCOSEU >=500 (A) 06/04/2018 1209   HGBUR SMALL (A) 06/04/2018 1209   BILIRUBINUR NEGATIVE 06/04/2018 1209   BILIRUBINUR negative 01/02/2015 1717   KETONESUR 20 (A) 06/04/2018 1209   PROTEINUR 100 (A) 06/04/2018 1209   UROBILINOGEN 0.2 01/13/2015 0223   NITRITE NEGATIVE 06/04/2018 1209   LEUKOCYTESUR NEGATIVE 06/04/2018 1209   Sepsis Labs: @LABRCNTIP (procalcitonin:4,lacticidven:4) )No results found for this or any previous visit (from the past 240 hour(s)).       Radiology Studies: No results found.    Scheduled Meds: . dicyclomine  20 mg Oral TID AC & HS  . enoxaparin (LOVENOX) injection  40 mg Subcutaneous Q24H  . feeding supplement (GLUCERNA SHAKE)  237 mL Oral TID BM  . HYDROcodone-acetaminophen  2 tablet Oral Once  . insulin aspart  0-5 Units Subcutaneous QHS  . insulin aspart  0-9 Units Subcutaneous TID WC  . insulin glargine  5 Units Subcutaneous Daily  . ketorolac  30 mg Intravenous  Q6H  . metoCLOPramide (REGLAN) injection  10 mg Intravenous Q6H  . mirtazapine  30 mg Oral QHS  . pantoprazole (PROTONIX) IV  40 mg Intravenous Q24H  . promethazine  25 mg Intravenous Q6H   Continuous Infusions: . sodium chloride 125 mL/hr at 08/01/18 1101  . potassium chloride 10 mEq (08/01/18 1104)     LOS: 2 days      Debbe Odea, MD [facility] Pager: [email or web address] Password [email or web address] 08/01/2018, 11:28 AM

## 2018-08-02 LAB — GLUCOSE, CAPILLARY
Glucose-Capillary: 119 mg/dL — ABNORMAL HIGH (ref 70–99)
Glucose-Capillary: 139 mg/dL — ABNORMAL HIGH (ref 70–99)
Glucose-Capillary: 123 mg/dL — ABNORMAL HIGH (ref 70–99)
Glucose-Capillary: 167 mg/dL — ABNORMAL HIGH (ref 70–99)

## 2018-08-02 LAB — BASIC METABOLIC PANEL
Anion gap: 12 (ref 5–15)
BUN: 14 mg/dL (ref 6–20)
CO2: 22 mmol/L (ref 22–32)
Calcium: 8 mg/dL — ABNORMAL LOW (ref 8.9–10.3)
Chloride: 104 mmol/L (ref 98–111)
Creatinine, Ser: 0.67 mg/dL (ref 0.44–1.00)
GFR calc Af Amer: >60 mL/min (ref >60)
GFR calc non Af Amer: >60 mL/min (ref >60)
Glucose, Bld: 124 mg/dL — ABNORMAL HIGH (ref 70–99)
Potassium: 3.1 mmol/L — ABNORMAL LOW (ref 3.5–5.1)
Sodium: 138 mmol/L (ref 135–145)

## 2018-08-02 MED ORDER — LIDOCAINE 5 % EX PTCH
2.0000 | MEDICATED_PATCH | CUTANEOUS | Status: DC
  Administered 2018-08-02: 2 via TRANSDERMAL
  Filled 2018-08-02: qty 2

## 2018-08-02 MED ORDER — LIDOCAINE VISCOUS HCL 2 % MT SOLN
15.0000 mL | Freq: Once | OROMUCOSAL | Status: AC
  Administered 2018-08-02: 15 mL via ORAL
  Filled 2018-08-02: qty 15

## 2018-08-02 MED ORDER — ALUM & MAG HYDROXIDE-SIMETH 200-200-20 MG/5ML PO SUSP
30.0000 mL | Freq: Once | ORAL | Status: AC
  Administered 2018-08-02: 30 mL via ORAL
  Filled 2018-08-02: qty 30

## 2018-08-02 MED ORDER — POTASSIUM CHLORIDE 10 MEQ/100ML IV SOLN
10.0000 meq | INTRAVENOUS | Status: AC
  Administered 2018-08-02 (×6): 10 meq via INTRAVENOUS
  Filled 2018-08-02 (×5): qty 100

## 2018-08-02 MED ORDER — LORAZEPAM 2 MG/ML IJ SOLN
0.5000 mg | Freq: Two times a day (BID) | INTRAMUSCULAR | Status: DC
  Administered 2018-08-02 – 2018-08-03 (×2): 0.5 mg via INTRAVENOUS
  Filled 2018-08-02 (×2): qty 1

## 2018-08-02 MED ORDER — POTASSIUM CHLORIDE IN NACL 20-0.9 MEQ/L-% IV SOLN
INTRAVENOUS | Status: DC
  Administered 2018-08-02 – 2018-08-03 (×2): via INTRAVENOUS
  Filled 2018-08-02 (×3): qty 1000

## 2018-08-02 MED ORDER — HYDROMORPHONE HCL 1 MG/ML IJ SOLN
0.5000 mg | Freq: Four times a day (QID) | INTRAMUSCULAR | Status: DC | PRN
  Administered 2018-08-02 – 2018-08-03 (×4): 0.5 mg via INTRAVENOUS
  Filled 2018-08-02 (×4): qty 0.5

## 2018-08-02 MED ORDER — FAMOTIDINE 20 MG PO TABS
20.0000 mg | ORAL_TABLET | Freq: Two times a day (BID) | ORAL | Status: DC
  Administered 2018-08-02 – 2018-08-03 (×3): 20 mg via ORAL
  Filled 2018-08-02 (×3): qty 1

## 2018-08-02 NOTE — Consult Note (Signed)
Referring Provider: [facility]  Primary Care Physician:  Ardith Dark, MD Primary Gastroenterologist: Harlin Rain, MD   Reason for Consultation:  Nausea / vomiting / gastroparesis    ASSESSMENT / PLAN:    1. 28 yo female with DM1 and severe gastroparesis. Managed outpatient with BID PPI, Remeron, Ativan. She is being worked up by [facility] for gastric pacer. Hospitalized now for 4 days with recurrent vomiting.  -[name] offered that cycles of vomiting often occur in response to life stressors. She feels depressed. Feels like Ativan does help but not asking for it in the hospital.  -I discussed case with [facility]. Will make Ativan scheduled BID dosing.  -continue Reglan for now.  -continue home Remeron -The dilaudid really needs to be stopped, she is getting it around the clock for upper back pain which Hospitalist feels is muscular (voltaren gel, heating pad, etc have already been suggested / tried). I explained to her that we really needed to wean / discontinue the narcotics for stomach to start emptying. She asks for one last dose.  -If no improvement with above will add IV Erythromycin, trying to hold off for now due to hx of prolonged QT.  -Toradol added today for her back pain.   -Hospitalist will ask Psychiatry to see to help with underlying depression / stress.   2. Chronic diarrhea, at baseline. Normal colonoscopy with random biopsies. Creon nor cholestyramine were helpful. We have pancreatic fecal elastase, Ttg, IgA already ordered as outpatient.     HPI:   [name] is a 28 y.o. female well known to Dr. Adela Lank. She has DM1, severe gastroparesis, chronic diarrhea, anxiety, fibromyalgia . We have been treated her gastroparesis. She doesn't do well with Reglan due to chronic diarrhea. She doesn't take Domeridone due to prolonged QT. She takes Remeron, Phenergan and ativan. She has used Capsaicin ointment but it burned her in the past (after a couple of hours).  Dr.  Adela Lank referred her to [facility] for consideration of gastric pacemaker and they felt she was a good candidate but wanted a repeat gastric emptying study. Unfortunately [facility] had to cancel due to COVID.    [name] was admitted on 07/29/18 with intractable vomiting and abdominal pain, unable to hold anything done in the previous 24 hours. Over the last several days she has been treated with IV phenergan and IV Reglan but remains symptomatic.  She has been getting Dilaudid several times a day. Hospitalist started IV Toradol today for upper back pain.   Kalani says that the vomiting is often correlated with stressors.  She takes Ativan at home a couple of times a day. It is ordered prn Q 6 hours but she rarely asks for it. She believes Ativan helps. She is getting IV Reglan in house it hasn't made her diarrhea any worse thankfully    Prior evaluation: Abnormal GES in the past c/w gastroparesis Colonoscopy 06/27/2017 - normal colon and ileum- normal random biopsies Flex sig 03/10/2017 - poor prep, normal colon with normal biopsies EGD 12/03/2011 - not completed due to retained food EGD 10/05/2017 - retained gastric fluid which limited some views of body / fundus, otherwise normal, no outlet obstruction  CT scan 03/23/2018 - distended stomach, mild transmural thickening of small and large bowel, mild hepatic steatosis CT angio scan 12/23/2017 - mild wall thickening of transverse colon, marked distension of bladder, body wall and mesenteric edema   Past Medical History:  Diagnosis Date  . Allergy   .  Anemia   . Anxiety   . Blood transfusion without reported diagnosis    Dec 2018  . Cataract    right eye  . Depression   . Diabetes type 1, uncontrolled (HCC) 11/14/2011   Since age 36  . Fibromyalgia   . Gastroparesis   . GERD (gastroesophageal reflux disease)   . Hypertension   . Infection    UTI April 2016    Past Surgical History:  Procedure Laterality Date  . ANKLE SURGERY    .  CHOLECYSTECTOMY  11/15/2011   Procedure: LAPAROSCOPIC CHOLECYSTECTOMY WITH INTRAOPERATIVE CHOLANGIOGRAM;  Surgeon: [name], MD;  Location: [facility];  Service: General;  Laterality: N/A;  . COLONOSCOPY    . COLONOSCOPY WITH PROPOFOL N/A 06/27/2017   Procedure: COLONOSCOPY WITH PROPOFOL;  Surgeon: Rachael Fee, MD;  Location: [facility];  Service: Endoscopy;  Laterality: N/A;  . ESOPHAGOGASTRODUODENOSCOPY  12/03/2011   Procedure: ESOPHAGOGASTRODUODENOSCOPY (EGD);  Surgeon: Theda Belfast, MD;  Location: [facility];  Service: Endoscopy;  Laterality: N/A;  . FLEXIBLE SIGMOIDOSCOPY N/A 03/10/2017   Procedure: FLEXIBLE SIGMOIDOSCOPY;  Surgeon: Jeani Hawking, MD;  Location: [facility];  Service: Endoscopy;  Laterality: N/A;  . INCISION AND DRAINAGE PERIRECTAL ABSCESS N/A 03/01/2017   Procedure: IRRIGATION AND DEBRIDEMENT PERIRECTAL ABSCESS;  Surgeon: Ovidio Kin, MD;  Location: [facility];  Service: General;  Laterality: N/A;  . IRRIGATION AND DEBRIDEMENT BUTTOCKS N/A 03/23/2017   Procedure: IRRIGATION AND DEBRIDEMENT BUTTOCKS, SETON PLACEMENT;  Surgeon: Romie Levee, MD;  Location: [facility];  Service: General;  Laterality: N/A;  . LAPAROSCOPY  11/23/2011   Procedure: LAPAROSCOPY DIAGNOSTIC;  Surgeon: Mariella Saa, MD;  Location: [facility];  Service: General;  Laterality: N/A;  . SIGMOIDOSCOPY    . UPPER GASTROINTESTINAL ENDOSCOPY    . WISDOM TOOTH EXTRACTION      Prior to Admission medications   Medication Sig Start Date End Date Taking? Authorizing Provider  capsaicin (ZOSTRIX) 0.025 % cream Apply topically 2 (two) times daily. 06/10/18  Yes Sheikh, Omair Latif, DO  Continuous Blood Gluc Sensor (FREESTYLE LIBRE 14 DAY SENSOR) MISC 1 Device by Does not apply route every 14 (fourteen) days. 06/22/18  Yes Ardith Dark, MD  dicyclomine (BENTYL) 20 MG tablet Take 1 tablet (20 mg total) by mouth 3 (three) times daily as needed for spasms (abdominal cramping). 06/03/18  Yes Long, Arlyss Repress, MD   feeding supplement, ENSURE ENLIVE, (ENSURE ENLIVE) LIQD Take 237 mLs by mouth 2 (two) times daily between meals. 05/18/18  Yes Alwyn Ren, MD  furosemide (LASIX) 40 MG tablet Take 1 tablet (40 mg total) by mouth daily. 07/20/18  Yes Armbruster, Willaim Rayas, MD  hyoscyamine (LEVSIN SL) 0.125 MG SL tablet Place 1 tablet (0.125 mg total) under the tongue every 6 (six) hours as needed for cramping (abdominal pain). 02/20/18  Yes Adhikari, Willia Craze, MD  insulin lispro (HUMALOG) 100 UNIT/ML injection Inject 0.04 mLs (4 Units total) into the skin 3 (three) times daily with meals. 06/18/18  Yes Romero Belling, MD  LANTUS 100 UNIT/ML injection Inject 0.05 mLs (5 Units total) into the skin at bedtime. Patient using Vials 06/18/18  Yes Romero Belling, MD  LORazepam (ATIVAN) 0.5 MG tablet Take 1 tablet (0.5 mg total) by mouth 2 (two) times daily as needed for anxiety. 05/31/18  Yes Ardith Dark, MD  losartan (COZAAR) 25 MG tablet Take 1 tablet (25 mg total) by mouth daily. 06/22/18  Yes Ardith Dark, MD  metoCLOPramide (REGLAN) 10 MG  tablet Take 1 tablet (10 mg total) by mouth every 6 (six) hours as needed for nausea or vomiting. 02/20/18  Yes Burnadette PopAdhikari, Amrit, MD  mirtazapine (REMERON) 30 MG tablet Take 1 tablet (30 mg total) by mouth at bedtime. 05/31/18  Yes Ardith DarkParker, Caleb M, MD  ondansetron (ZOFRAN) 4 MG tablet Take 1 tablet (4 mg total) by mouth every 8 (eight) hours as needed for nausea or vomiting (If necessary, dont combine with Reglan.). 03/25/18  Yes Amin, Ankit Chirag, MD  pantoprazole (PROTONIX) 40 MG tablet Take 1 tablet (40 mg total) by mouth 2 (two) times daily. 06/10/18  Yes Sheikh, Omair Latif, DO  promethazine (PHENERGAN) 12.5 MG suppository Place 1 suppository (12.5 mg total) rectally every 8 (eight) hours as needed for nausea or vomiting. Note: Do not take while taking oral phenergan 07/27/18  Yes Armbruster, Willaim RayasSteven P, MD  promethazine (PHENERGAN) 12.5 MG tablet TAKE 1 TABLET BY MOUTH  EVERY 8 HOURS  AS NEEDED FOR NAUSEA AND VOMITING Patient taking differently: Take 12.5 mg by mouth every 8 (eight) hours as needed for nausea or vomiting.  07/19/18  Yes Armbruster, Willaim RayasSteven P, MD    Current Facility-Administered Medications  Medication Dose Route Frequency Provider Last Rate Last Dose  . 0.9 % NaCl with KCl 20 mEq/ L  infusion   Intravenous Continuous Calvert Cantorizwan, Saima, MD 100 mL/hr at 08/02/18 1304    . dicyclomine (BENTYL) tablet 20 mg  20 mg Oral TID AC & HS Rizwan, Saima, MD   20 mg at 08/02/18 1027  . enoxaparin (LOVENOX) injection 40 mg  40 mg Subcutaneous Q24H Earlie LouGarba, Mohammad L, MD   40 mg at 08/02/18 0524  . famotidine (PEPCID) tablet 20 mg  20 mg Oral BID Calvert Cantorizwan, Saima, MD   20 mg at 08/02/18 1027  . feeding supplement (GLUCERNA SHAKE) (GLUCERNA SHAKE) liquid 237 mL  237 mL Oral TID BM Rizwan, Saima, MD   237 mL at 08/01/18 1333  . hydrALAZINE (APRESOLINE) injection 10 mg  10 mg Intravenous Q6H PRN Bodenheimer, Charles A, NP   10 mg at 08/01/18 2222  . HYDROcodone-acetaminophen (NORCO/VICODIN) 5-325 MG per tablet 2 tablet  2 tablet Oral Once Schorr, Roma KayserKatherine P, NP      . HYDROmorphone (DILAUDID) injection 0.5 mg  0.5 mg Intravenous Q3H PRN Calvert Cantorizwan, Saima, MD   0.5 mg at 08/02/18 1449  . insulin aspart (novoLOG) injection 0-5 Units  0-5 Units Subcutaneous QHS Rizwan, Saima, MD      . insulin aspart (novoLOG) injection 0-9 Units  0-9 Units Subcutaneous TID WC Calvert Cantorizwan, Saima, MD   2 Units at 08/01/18 1811  . insulin glargine (LANTUS) injection 5 Units  5 Units Subcutaneous Daily Schorr, Roma KayserKatherine P, NP   5 Units at 08/02/18 1151  . LORazepam (ATIVAN) injection 0.5 mg  0.5 mg Intravenous Q6H PRN Calvert Cantorizwan, Saima, MD   0.5 mg at 08/02/18 1024  . metoCLOPramide (REGLAN) injection 10 mg  10 mg Intravenous Q6H Rizwan, Ladell HeadsSaima, MD   10 mg at 08/02/18 1148  . mirtazapine (REMERON) tablet 30 mg  30 mg Oral QHS Calvert Cantorizwan, Saima, MD   30 mg at 08/01/18 2221  . Muscle Rub CREA   Topical PRN Calvert Cantorizwan, Saima, MD       . pantoprazole (PROTONIX) injection 40 mg  40 mg Intravenous Q24H Earlie LouGarba, Mohammad L, MD   40 mg at 08/02/18 0526  . potassium chloride 10 mEq in 100 mL IVPB  10 mEq Intravenous Q1 Hr x 6 Rizwan, Saima,  MD 100 mL/hr at 08/02/18 1449 10 mEq at 08/02/18 1449  . promethazine (PHENERGAN) injection 25 mg  25 mg Intravenous Q6H Rizwan, Saima, MD   25 mg at 08/02/18 1148  . sodium chloride flush (NS) 0.9 % injection 10-40 mL  10-40 mL Intracatheter PRN Rometta Emery, MD   10 mL at 07/30/18 0252    Allergies as of 07/29/2018 - Review Complete 07/29/2018  Allergen Reaction Noted  . Other Anaphylaxis 12/13/2011  . Lactose intolerance (gi) Diarrhea 01/22/2015    Family History  Problem Relation Age of Onset  . Diabetes Mother   . Hypertension Father   . Colon cancer Paternal Grandmother        pt thinks PGM was dx in her 30's  . Diabetes Paternal Grandmother   . Diabetes Maternal Grandmother   . Diabetes Maternal Grandfather   . Diabetes Paternal Grandfather   . Diabetes Other   . Esophageal cancer Neg Hx   . Liver cancer Neg Hx   . Pancreatic cancer Neg Hx   . Stomach cancer Neg Hx   . Rectal cancer Neg Hx     Social History   Socioeconomic History  . Marital status: Married    Spouse name: sergio  . Number of children: 0  . Years of education: college  . Highest education level: Not on file  Occupational History  . Occupation: Equities trader call center    Employer: [employer]  Social Needs  . Financial resource strain: Not on file  . Food insecurity:    Worry: Not on file    Inability: Not on file  . Transportation needs:    Medical: Not on file    Non-medical: Not on file  Tobacco Use  . Smoking status: Never Smoker  . Smokeless tobacco: Never Used  Substance and Sexual Activity  . Alcohol use: No    Alcohol/week: 0.0 standard drinks    Frequency: Never  . Drug use: No  . Sexual activity: Not Currently    Partners: Male    Birth control/protection: None   Lifestyle  . Physical activity:    Days per week: Not on file    Minutes per session: Not on file  . Stress: Not on file  Relationships  . Social connections:    Talks on phone: Not on file    Gets together: Not on file    Attends religious service: Not on file    Active member of club or organization: Not on file    Attends meetings of clubs or organizations: Not on file    Relationship status: Not on file  . Intimate partner violence:    Fear of current or ex partner: Not on file    Emotionally abused: Not on file    Physically abused: Not on file    Forced sexual activity: Not on file  Other Topics Concern  . Not on file  Social History Narrative  . Not on file    Review of Systems: All systems reviewed and negative except where noted in HPI.  Physical Exam: Vital signs in last 24 hours: Temp:  [98.7 F (37.1 C)-99.3 F (37.4 C)] 98.7 F (37.1 C) (05/04 1254) Pulse Rate:  [106-114] 106 (05/04 1254) Resp:  [17-18] 17 (05/04 0526) BP: (141-165)/(93-109) 165/109 (05/04 1254) SpO2:  [99 %-100 %] 100 % (05/04 1254) Weight:  [62.9 kg] 62.9 kg (05/04 0529) Last BM Date: 07/30/18 General:   Alert, well-developed, female in NAD Psych:  Cooperative. Flat affect. Eyes:  Pupils equal, sclera clear, no icterus.   Conjunctiva pink. Ears:  Normal auditory acuity. Nose:  No deformity, discharge,  or lesions. Neck:  Supple; no masses Lungs:  Clear throughout to auscultation.   No wheezes, crackles, or rhonchi.  Heart:  Regular rate and rhythm; no murmurs, no lower extremity edema Abdomen:  Soft, non-distended, nontender, BS active, no palp mass    Rectal:  Deferred  Msk:  Symmetrical without gross deformities. . Neurologic:  Alert and  oriented x4;  grossly normal neurologically. Skin:  Intact without significant lesions or rashes.   Intake/Output from previous day: No intake/output data recorded. Intake/Output this shift: No intake/output data recorded.  Lab Results:  No results for input(s): WBC, HGB, HCT, PLT in the last 72 hours. BMET Recent Labs    07/31/18 1036 08/01/18 0505 08/02/18 0405  NA 141 140 138  K 3.0* 2.8* 3.1*  CL 108 105 104  CO2 24 25 22   GLUCOSE 169* 115* 124*  BUN 20 10 14   CREATININE 0.64 0.62 0.67  CALCIUM 8.3* 8.0* 8.0*   LFT No results for input(s): PROT, ALBUMIN, AST, ALT, ALKPHOS, BILITOT, BILIDIR, IBILI in the last 72 hours. PT/INR No results for input(s): LABPROT, INR in the last 72 hours. Hepatitis Panel No results for input(s): HEPBSAG, HCVAB, HEPAIGM, HEPBIGM in the last 72 hours.    Studies/Results: No results found.   Willette Cluster, NP-C @  08/02/2018, 3:28 PM

## 2018-08-02 NOTE — Progress Notes (Addendum)
PROGRESS NOTE    [name]   PJA:[id number]  DOB: 1990/07/07  DOA: 07/29/2018 PCP: [name], MD   Brief Narrative:  [name]  is a 28 y.o. female with medical history significant of recurrent cyclical vomiting due to diabetic gastroparesis, type 1 diabetes, fibromyalgia, anxiety disorder, GERD who was last admitted to the hospital on June 04, 2018 with intractable vomiting.  she presents again for the same and states she has abdominal pain, nausea and vomiting for 24 hrs.    Subjective: Continues to have back pain. No has heartburn and vomited yesterday.     Assessment & Plan:   Principal Problem:   Diabetic gastroparesis associated with type 1 diabetes mellitus - last A1c on 06/09/18 was 10.0 - she has ongoing nausea, vomiting and abdominal pain - cont Phenergan and Reglan to IV QID routine - cont IVF - added Ativan to help with nausea and anxiety - cut back on Dilaudid today - add Pepcid BID and GI cocktail - She is still not able to advance diet and continues to have vomiting intermittently now has heartburn- will call [facility]- was previously referred to [facility] for a pacer   Active Problems:  Dehydration - cont IVF - add K to fluids  Severe hypokalemia - K 3.1- continue to replace via IV  Upper back pain - has likely strained her muscles while vomiting- she states she is not able to tolerate Voltaren gel as it irritates her skin - try muscle rub - added IV Toradol but will d/c this due to heartburn - try Lidocaine patches  Depression/anxiety - apparently this worsens her GI symptoms- will ask for psych eval    Hypertension associated with diabetes    - Losartan - d/c Lasix while unable to eat/drink    Diabetes mellitus type 1  - she was on an insulin infusion which I have discontinued - now on Lantus and SSI   Time spent in minutes:  35 DVT prophylaxis: Lovenox Code Status: Full code Family Communication:  Disposition Plan: home  Consultants:   none Procedures:   none Antimicrobials:  Anti-infectives (From admission, onward)   None       Objective: Vitals:   08/02/18 0020 08/02/18 0526 08/02/18 0529 08/02/18 1254  BP: (!) 141/93 (!) 162/98  (!) 165/109  Pulse: (!) 113 (!) 114  (!) 106  Resp: 18 17    Temp: 99.3 F (37.4 C) 99.3 F (37.4 C)  98.7 F (37.1 C)  TempSrc: Oral Oral    SpO2: 99% 100%  100%  Weight:   62.9 kg   Height:       No intake or output data in the 24 hours ending 08/02/18 1341 Filed Weights   07/31/18 0604 08/01/18 0528 08/02/18 0529  Weight: 61.4 kg 60.1 kg 62.9 kg    Examination: General exam: Appears comfortable  HEENT: PERRLA, oral mucosa moist, no sclera icterus or thrush Respiratory system: Clear to auscultation. Respiratory effort normal. Cardiovascular system: S1 & S2 heard,  No murmurs  Gastrointestinal system: Abdomen soft, tender in upper and mid abdomen, nondistended. Normal bowel sounds   MSK: tender in upper back Central nervous system: Alert and oriented. No focal neurological deficits. Extremities: No cyanosis, clubbing or edema Skin: No rashes or ulcers Psychiatry:  Mood & affect appropriate.   Data Reviewed: I have personally reviewed following labs and imaging studies  CBC: Recent Labs  Lab 07/29/18 2108 07/30/18 0233  WBC 4.3 6.5  NEUTROABS  2.9  --   HGB 11.7* 10.8*  HCT 35.4* 32.4*  MCV 84.9 84.2  PLT 212 627   Basic Metabolic Panel: Recent Labs  Lab 07/29/18 2108 07/30/18 0233 07/31/18 1036 08/01/18 0505 08/02/18 0405  NA 136 141 141 140 138  K 4.2 3.5 3.0* 2.8* 3.1*  CL 97* 104 108 105 104  CO2 24 25 24 25 22   GLUCOSE 429* 189* 169* 115* 124*  BUN 23* 18 20 10 14   CREATININE 0.88 0.81 0.64 0.62 0.67  CALCIUM 9.6 9.1 8.3* 8.0* 8.0*  MG 2.1  --   --  1.8  --    GFR: Estimated Creatinine Clearance: 101.8 mL/min (by C-G formula based on SCr of 0.67 mg/dL). Liver Function Tests: Recent Labs  Lab 07/29/18 2108 07/30/18  0233  AST 20 24  ALT 25 24  ALKPHOS 109 98  BILITOT 0.6 0.5  PROT 8.0 7.6  ALBUMIN 3.7 3.4*   Recent Labs  Lab 07/29/18 2108  LIPASE 24   No results for input(s): AMMONIA in the last 168 hours. Coagulation Profile: No results for input(s): INR, PROTIME in the last 168 hours. Cardiac Enzymes: No results for input(s): CKTOTAL, CKMB, CKMBINDEX, TROPONINI in the last 168 hours. BNP (last 3 results) No results for input(s): PROBNP in the last 8760 hours. HbA1C: No results for input(s): HGBA1C in the last 72 hours. CBG: Recent Labs  Lab 08/01/18 1156 08/01/18 1704 08/01/18 2124 08/02/18 0806 08/02/18 1253  GLUCAP 112* 151* 91 119* 139*   Lipid Profile: No results for input(s): CHOL, HDL, LDLCALC, TRIG, CHOLHDL, LDLDIRECT in the last 72 hours. Thyroid Function Tests: No results for input(s): TSH, T4TOTAL, FREET4, T3FREE, THYROIDAB in the last 72 hours. Anemia Panel: No results for input(s): VITAMINB12, FOLATE, FERRITIN, TIBC, IRON, RETICCTPCT in the last 72 hours. Urine analysis:    Component Value Date/Time   COLORURINE YELLOW 06/04/2018 1209   APPEARANCEUR CLEAR 06/04/2018 1209   LABSPEC 1.020 06/04/2018 1209   PHURINE 7.0 06/04/2018 1209   GLUCOSEU >=500 (A) 06/04/2018 1209   HGBUR SMALL (A) 06/04/2018 1209   BILIRUBINUR NEGATIVE 06/04/2018 1209   BILIRUBINUR negative 01/02/2015 1717   KETONESUR 20 (A) 06/04/2018 1209   PROTEINUR 100 (A) 06/04/2018 1209   UROBILINOGEN 0.2 01/13/2015 0223   NITRITE NEGATIVE 06/04/2018 1209   LEUKOCYTESUR NEGATIVE 06/04/2018 1209   Sepsis Labs: @LABRCNTIP (procalcitonin:4,lacticidven:4) )No results found for this or any previous visit (from the past 240 hour(s)).       Radiology Studies: No results found.    Scheduled Meds: . dicyclomine  20 mg Oral TID AC & HS  . enoxaparin (LOVENOX) injection  40 mg Subcutaneous Q24H  . famotidine  20 mg Oral BID  . feeding supplement (GLUCERNA SHAKE)  237 mL Oral TID BM  .  HYDROcodone-acetaminophen  2 tablet Oral Once  . insulin aspart  0-5 Units Subcutaneous QHS  . insulin aspart  0-9 Units Subcutaneous TID WC  . insulin glargine  5 Units Subcutaneous Daily  . metoCLOPramide (REGLAN) injection  10 mg Intravenous Q6H  . mirtazapine  30 mg Oral QHS  . pantoprazole (PROTONIX) IV  40 mg Intravenous Q24H  . promethazine  25 mg Intravenous Q6H   Continuous Infusions: . 0.9 % NaCl with KCl 20 mEq / L 100 mL/hr at 08/02/18 1304  . potassium chloride 10 mEq (08/02/18 1306)     LOS: 3 days      Debbe Odea, MD [facility] Pager: [email or web address] Password [email or web address] 08/02/2018, 1:41  PM

## 2018-08-03 ENCOUNTER — Ambulatory Visit: Payer: 59 | Admitting: Family Medicine

## 2018-08-03 DIAGNOSIS — K210 Gastro-esophageal reflux disease with esophagitis: Secondary | ICD-10-CM

## 2018-08-03 DIAGNOSIS — R739 Hyperglycemia, unspecified: Secondary | ICD-10-CM

## 2018-08-03 LAB — BASIC METABOLIC PANEL
Anion gap: 9 (ref 5–15)
BUN: 16 mg/dL (ref 6–20)
CO2: 23 mmol/L (ref 22–32)
Calcium: 8.5 mg/dL — ABNORMAL LOW (ref 8.9–10.3)
Chloride: 107 mmol/L (ref 98–111)
Creatinine, Ser: 0.84 mg/dL (ref 0.44–1.00)
GFR calc Af Amer: >60 mL/min (ref >60)
GFR calc non Af Amer: >60 mL/min (ref >60)
Glucose, Bld: 193 mg/dL — ABNORMAL HIGH (ref 70–99)
Potassium: 3.6 mmol/L (ref 3.5–5.1)
Sodium: 139 mmol/L (ref 135–145)

## 2018-08-03 LAB — GLUCOSE, CAPILLARY
Glucose-Capillary: 244 mg/dL — ABNORMAL HIGH (ref 70–99)
Glucose-Capillary: 175 mg/dL — ABNORMAL HIGH (ref 70–99)

## 2018-08-03 MED ORDER — LIDOCAINE 5 % EX PTCH: 2.0000 | MEDICATED_PATCH | CUTANEOUS | 0 refills | Status: DC

## 2018-08-03 NOTE — TOC Transition Note (Signed)
Transition of Care [facility]) - CM/SW Discharge Note   Patient Details  Name: [name] MRN: [id number] Date of Birth: 04/25/1990  Transition of Care [facility]) CM/SW Contact:  Bess Kinds, RN Phone Number: 08/03/2018, 12:42 PM   Clinical Narrative:    Patient to transition home with spouse today. No transition of care needs identified.   Final next level of care: Home/Self Care Barriers to Discharge: No Barriers Identified   Patient Goals and CMS Choice Patient states their goals for this hospitalization and ongoing recovery are:: to keep stomach pains under control so she can eat   Choice offered to / list presented to : NA  Discharge Placement                       Discharge Plan and Services In-house Referral: NA Discharge Planning Services: CM Consult Post Acute Care Choice: NA          DME Arranged: N/A DME Agency: NA       HH Arranged: NA HH Agency: NA        Social Determinants of Health (SDOH) Interventions     Readmission Risk Interventions Readmission Risk Prevention Plan 07/30/2018 06/09/2018  Transportation Screening Complete Complete  PCP or Specialist Appt within 3-5 Days Complete -  HRI or Home Care Consult Not Complete -  HRI or Home Care Consult comments NA -  Social Work Consult for Recovery Care Planning/Counseling Not Complete -  SW consult not completed comments NA -  Palliative Care Screening Not Applicable -  Medication Review [facility]) (No Data) Complete  PCP or Specialist appointment within 3-5 days of discharge - Complete  HRI or Home Care Consult - Complete  SW Recovery Care/Counseling Consult - Complete  Palliative Care Screening - Not Applicable  Skilled Nursing Facility - Not Applicable  Some recent data might be hidden

## 2018-08-03 NOTE — Progress Notes (Signed)
Discharge teaching complete. Meds, diet, activity and follow up appointments reviewed and all questions answered. Copy of instructions given to patient. Meds sent to pharmacy. Patient discharged home via wheelchair with husband.

## 2018-08-03 NOTE — Progress Notes (Signed)
[facility] ROUNDING NOTE   Subjective: No acute events overnight.  Significant improvement in nausea and vomiting.  Tolerated Salsberry steak last evening and grits and omelette this morning.  No nausea vomiting overnight.  Feels much improved and hopeful for discharge later today.  States that she had significant clinical improvement with the Ativan, and endorses a strong relationship between these exacerbations and underlying stress.  Has taken Ativan as outpatient, with source clinical response.   Objective: Vital signs in last 24 hours: Temp:  [98.7 F (37.1 C)-99 F (37.2 C)] 98.7 F (37.1 C) (05/05 0520) Pulse Rate:  [102-106] 105 (05/05 0520) BP: (129-173)/(95-116) 138/98 (05/05 0520) SpO2:  [100 %] 100 % (05/05 0520) Last BM Date: 08/03/18 General: NAD Abdomen: Soft, NT, ND Ext: No C/C/E    Intake/Output from previous day: 05/04 0701 - 05/05 0700 In: 1000 [I.V.:1000] Out: -  Intake/Output this shift: Total I/O In: 440 [P.O.:240; I.V.:200] Out: -    Lab Results: No results for input(s): WBC, HGB, PLT, MCV in the last 72 hours. BMET Recent Labs    08/01/18 0505 08/02/18 0405 08/03/18 0444  NA 140 138 139  K 2.8* 3.1* 3.6  CL 105 104 107  CO2 25 22 23   GLUCOSE 115* 124* 193*  BUN 10 14 16   CREATININE 0.62 0.67 0.84  CALCIUM 8.0* 8.0* 8.5*   LFT No results for input(s): PROT, ALBUMIN, AST, ALT, ALKPHOS, BILITOT, BILIDIR, IBILI in the last 72 hours. PT/INR No results for input(s): INR in the last 72 hours.    Imaging/Other results: No results found.    Assessment and Recommendations  28 year old female with diabetes complicated by severe diabetic gastroparesis, readmitted with exacerbation of nausea/vomiting.  States that her most current exacerbation related to increased stress, which has been the case in the past.  Has had significant clinical improvement with Ativan.  Tolerating all p.o. intake and without complaints.  - Okay for  discharge home today from a GI standpoint - Continue gastroparesis diet as previously doing - Strong recommendation for referral to Behavioral Health Therapy for assistance with stress coping - Resume antiemetics, Pepcid, Remeron as previously prescribed at home  - Recommend discharge with BZD's to take prn until able to establish care with BHT - Follow-up with [facility] with plan for repeat GES and possible gastric stim placement     Shean Gerding V Haide Klinker, DO  08/03/2018, 10:07 AM [facility] Pager [phone]

## 2018-08-03 NOTE — Discharge Summary (Signed)
Physician Discharge Summary  [name] IHW:[id number] DOB: [birth year] DOA: 07/29/2018  PCP: [name], MD  Admit date: 07/29/2018 Discharge date: 08/03/2018  Admitted From: home Disposition:  home   Recommendations for Outpatient Follow-up:  1. Needs to f/u with [facility] for pacemaker 2. Needs to f/u with psychiatry for anxiety   Discharge Condition:  stable   CODE STATUS:  Full code   Consultations:  GI    Discharge Diagnoses:  Principal Problem:   Diabetic gastroparesis associated with type 1 diabetes mellitus ([place]) Active Problems:   Intractable vomiting with nausea   Hypertension associated with diabetes ([place])   Gastroparesis   Diabetes mellitus type 1 (HCC)   GERD (gastroesophageal reflux disease)       Brief Summary: [name] is a 28 y.o.femalewith medical history significant ofrecurrent cyclical vomiting due to diabetic gastroparesis, type 1 diabetes, fibromyalgia, anxiety disorder, GERD who was last admitted to the hospital on June 04, 2018 with intractable vomiting. she presents again for the same and states she has abdominal pain, nausea and vomiting for 24 hrs.   Hospital Course:  Principal Problem: Intractable vomiting  Diabetic gastroparesis associated with type 1 diabetes mellitus Anxiety - last A1c on 06/09/18 was 10.0 - she has ongoing nausea, vomiting and abdominal pain - treated with IV antiemetics and IV pain medications - she had persistent symptoms until she began using Ativan routinely after which she was able to tolerate orals - plans for gastric pacemaker at [facility] are on hold due to Coronavirus   Active Problems:  Dehydration  - due to above- treated with IVF  Severe hypokalemia - replaced  Anxiety/ depression - related to her health - she states she now becomes severely anxious in anticipation that she may end up vomiting again and the anxiety results in more vomiting- she is currently using Ativan to keep anxiety  controlled - I did consult psych but per psych, it is better addressed by psychiatry and a psychologist as outpt- she is in agreement  Upper back pain - has likely strained her muscles while vomiting- she states she is not able to tolerate Voltaren gel as it irritates her skin - we tried multiple different medications and discovered Lidoderm patches worked best for her- I have prescribed these     Hypertension associated with diabetes    - Losartan - d/c'd Lasix in the hospital while unable to eat/drink    Diabetes mellitus type 1  - she was on an insulin infusion which I have discontinued - now on Lantus and SSI   Discharge Exam: Vitals:   08/02/18 2300 08/03/18 0520  BP: (!) 129/95 (!) 138/98  Pulse:  (!) 105  Resp:    Temp:  98.7 F (37.1 C)  SpO2:  100%   Vitals:   08/02/18 1254 08/02/18 2058 08/02/18 2300 08/03/18 0520  BP: (!) 165/109 (!) 173/116 (!) 129/95 (!) 138/98  Pulse: (!) 106 (!) 102  (!) 105  Resp:      Temp: 98.7 F (37.1 C) 99 F (37.2 C)  98.7 F (37.1 C)  TempSrc:  Oral  Oral  SpO2: 100% 100%  100%  Weight:      Height:        General: Pt is alert, awake, not in acute distress Cardiovascular: RRR, S1/S2 +, no rubs, no gallops Respiratory: CTA bilaterally, no wheezing, no rhonchi Abdominal: Soft, NT, ND, bowel sounds + Extremities: no edema, no cyanosis   Discharge Instructions  Discharge Instructions  Diet - low sodium heart healthy   Complete by:  As directed    Diet Carb Modified   Complete by:  As directed    Increase activity slowly   Complete by:  As directed      Allergies as of 08/03/2018      Reactions   Other Anaphylaxis   Reaction to [place] nuts   Lactose Intolerance (gi) Diarrhea      Medication List    TAKE these medications   capsaicin 0.025 % cream Commonly known as:  ZOSTRIX Apply topically 2 (two) times daily.   dicyclomine 20 MG tablet Commonly known as:  BENTYL Take 1 tablet (20 mg total) by mouth 3  (three) times daily as needed for spasms (abdominal cramping).   feeding supplement (ENSURE ENLIVE) Liqd Take 237 mLs by mouth 2 (two) times daily between meals.   FreeStyle Libre 14 Day Sensor Misc 1 Device by Does not apply route every 14 (fourteen) days.   furosemide 40 MG tablet Commonly known as:  Lasix Take 1 tablet (40 mg total) by mouth daily.   hyoscyamine 0.125 MG SL tablet Commonly known as:  LEVSIN SL Place 1 tablet (0.125 mg total) under the tongue every 6 (six) hours as needed for cramping (abdominal pain).   insulin lispro 100 UNIT/ML injection Commonly known as:  HUMALOG Inject 0.04 mLs (4 Units total) into the skin 3 (three) times daily with meals.   Lantus 100 UNIT/ML injection Generic drug:  insulin glargine Inject 0.05 mLs (5 Units total) into the skin at bedtime. Patient using Vials   lidocaine 5 % Commonly known as:  LIDODERM Place 2 patches onto the skin daily. Remove & Discard patch within 12 hours or as directed by MD   LORazepam 0.5 MG tablet Commonly known as:  Ativan Take 1 tablet (0.5 mg total) by mouth 2 (two) times daily as needed for anxiety.   losartan 25 MG tablet Commonly known as:  COZAAR Take 1 tablet (25 mg total) by mouth daily.   metoCLOPramide 10 MG tablet Commonly known as:  REGLAN Take 1 tablet (10 mg total) by mouth every 6 (six) hours as needed for nausea or vomiting.   mirtazapine 30 MG tablet Commonly known as:  Remeron Take 1 tablet (30 mg total) by mouth at bedtime.   ondansetron 4 MG tablet Commonly known as:  Zofran Take 1 tablet (4 mg total) by mouth every 8 (eight) hours as needed for nausea or vomiting (If necessary, dont combine with Reglan.).   pantoprazole 40 MG tablet Commonly known as:  PROTONIX Take 1 tablet (40 mg total) by mouth 2 (two) times daily.   promethazine 12.5 MG tablet Commonly known as:  PHENERGAN TAKE 1 TABLET BY MOUTH  EVERY 8 HOURS AS NEEDED FOR NAUSEA AND VOMITING What changed:  See the  new instructions.   promethazine 12.5 MG suppository Commonly known as:  Phenergan Place 1 suppository (12.5 mg total) rectally every 8 (eight) hours as needed for nausea or vomiting. Note: Do not take while taking oral phenergan What changed:  Another medication with the same name was changed. Make sure you understand how and when to take each.      Follow-up Information    [name], MD. Schedule an appointment as soon as possible for a visit in 3 day(s).   Specialty:  Family Medicine Why:  Please call to make hospital followup appointment Contact information: [address] [place] [address] [phone]  Allergies  Allergen Reactions  . Other Anaphylaxis    Reaction to [place] nuts   . Lactose Intolerance (Gi) Diarrhea     Procedures/Studies:    Dg Chest Port 1 View  Result Date: 07/29/2018 CLINICAL DATA:  Diabetic patient.  Unresponsive. EXAM: PORTABLE CHEST 1 VIEW COMPARISON:  None. FINDINGS: The heart size and mediastinal contours are within normal limits. Both lungs are clear. The visualized skeletal structures are unremarkable. IMPRESSION: No active disease. Electronically Signed   By: Nelson Chimes M.D.   On: 07/29/2018 21:20     The results of significant diagnostics from this hospitalization (including imaging, microbiology, ancillary and laboratory) are listed below for reference.     Microbiology: No results found for this or any previous visit (from the past 240 hour(s)).   Labs: BNP (last 3 results) No results for input(s): BNP in the last 8760 hours. Basic Metabolic Panel: Recent Labs  Lab 07/29/18 2108 07/30/18 0233 07/31/18 1036 08/01/18 0505 08/02/18 0405 08/03/18 0444  NA 136 141 141 140 138 139  K 4.2 3.5 3.0* 2.8* 3.1* 3.6  CL 97* 104 108 105 104 107  CO2 24 25 24 25 22 23   GLUCOSE 429* 189* 169* 115* 124* 193*  BUN 23* 18 20 10 14 16   CREATININE 0.88 0.81 0.64 0.62 0.67 0.84  CALCIUM 9.6 9.1 8.3* 8.0* 8.0* 8.5*   MG 2.1  --   --  1.8  --   --    Liver Function Tests: Recent Labs  Lab 07/29/18 2108 07/30/18 0233  AST 20 24  ALT 25 24  ALKPHOS 109 98  BILITOT 0.6 0.5  PROT 8.0 7.6  ALBUMIN 3.7 3.4*   Recent Labs  Lab 07/29/18 2108  LIPASE 24   No results for input(s): AMMONIA in the last 168 hours. CBC: Recent Labs  Lab 07/29/18 2108 07/30/18 0233  WBC 4.3 6.5  NEUTROABS 2.9  --   HGB 11.7* 10.8*  HCT 35.4* 32.4*  MCV 84.9 84.2  PLT 212 195   Cardiac Enzymes: No results for input(s): CKTOTAL, CKMB, CKMBINDEX, TROPONINI in the last 168 hours. BNP: Invalid input(s): POCBNP CBG: Recent Labs  Lab 08/02/18 1253 08/02/18 1642 08/02/18 2054 08/03/18 0814 08/03/18 1150  GLUCAP 139* 123* 167* 244* 175*   D-Dimer No results for input(s): DDIMER in the last 72 hours. Hgb A1c No results for input(s): HGBA1C in the last 72 hours. Lipid Profile No results for input(s): CHOL, HDL, LDLCALC, TRIG, CHOLHDL, LDLDIRECT in the last 72 hours. Thyroid function studies No results for input(s): TSH, T4TOTAL, T3FREE, THYROIDAB in the last 72 hours.  Invalid input(s): FREET3 Anemia work up No results for input(s): VITAMINB12, FOLATE, FERRITIN, TIBC, IRON, RETICCTPCT in the last 72 hours. Urinalysis    Component Value Date/Time   COLORURINE YELLOW 06/04/2018 1209   APPEARANCEUR CLEAR 06/04/2018 1209   LABSPEC 1.020 06/04/2018 1209   PHURINE 7.0 06/04/2018 1209   GLUCOSEU >=500 (A) 06/04/2018 1209   HGBUR SMALL (A) 06/04/2018 1209   BILIRUBINUR NEGATIVE 06/04/2018 1209   BILIRUBINUR negative 01/02/2015 1717   KETONESUR 20 (A) 06/04/2018 1209   PROTEINUR 100 (A) 06/04/2018 1209   UROBILINOGEN 0.2 01/13/2015 0223   NITRITE NEGATIVE 06/04/2018 1209   LEUKOCYTESUR NEGATIVE 06/04/2018 1209   Sepsis Labs Invalid input(s): PROCALCITONIN,  WBC,  LACTICIDVEN Microbiology No results found for this or any previous visit (from the past 240 hour(s)).   Time coordinating discharge in  minutes: 65  SIGNED:   Debbe Odea,  MD  [facility] 08/03/2018, 3:40 PM Pager   If 7PM-7AM, please contact night-coverage [email or web address] Password [email or web address]

## 2018-08-05 ENCOUNTER — Telehealth: Payer: Self-pay

## 2018-08-05 NOTE — Telephone Encounter (Signed)
Per Chart Review:  Admit date: 07/29/2018 Discharge date: 08/03/2018  Admitted From: home Disposition:  home   Recommendations for Outpatient Follow-up:  1. Needs to f/u with [facility] for pacemaker 2. Needs to f/u with psychiatry for anxiety   Discharge Condition:  stable   CODE STATUS:  Full code   Consultations:  GI    Discharge Diagnoses:  Principal Problem:   Diabetic gastroparesis associated with type 1 diabetes mellitus (HCC) Active Problems:   Intractable vomiting with nausea   Hypertension associated with diabetes (HCC)   Gastroparesis   Diabetes mellitus type 1 (HCC)   GERD (gastroesophageal reflux disease)    Brief Summary: [name] a 28 y.o.femalewith medical history significant ofrecurrent cyclical vomiting due to diabetic gastroparesis, type 1 diabetes, fibromyalgia, anxiety disorder, GERD who was last admitted to the hospital on June 04, 2018 with intractable vomiting. she presents again for the same and states she has abdominal pain, nausea and vomiting for 24 hrs.   Per Telephone Call:  Transition Care Management Follow-up Telephone Call   Date discharged?  08/03/2018   How have you been since you were released from the hospital? "Better"   Do you understand why you were in the hospital? yes   Do you understand the discharge instructions? yes   Where were you discharged to? Home   Items Reviewed:  Medications reviewed: yes  Allergies reviewed: yes  Dietary changes reviewed: yes  Referrals reviewed: yes   Functional Questionnaire:   Activities of Daily Living (ADLs):   She states they are independent in the following: ambulation, bathing and hygiene, feeding, continence, grooming, toileting and dressing States they require assistance with the following: N/A   Any transportation issues/concerns?: no   Any patient concerns? no   Confirmed importance and date/time of follow-up visits scheduled yes  Provider  Appointment booked with Dr. Jimmey Ralph on 08/06/2018 @ 3:20 pm  Confirmed with patient if condition begins to worsen call PCP or go to the ER.  Patient was given the office number and encouraged to call back with question or concerns.  : yes

## 2018-08-06 ENCOUNTER — Ambulatory Visit (INDEPENDENT_AMBULATORY_CARE_PROVIDER_SITE_OTHER): Payer: 59 | Admitting: Family Medicine

## 2018-08-06 ENCOUNTER — Encounter: Payer: Self-pay | Admitting: Family Medicine

## 2018-08-06 DIAGNOSIS — E1069 Type 1 diabetes mellitus with other specified complication: Secondary | ICD-10-CM

## 2018-08-06 DIAGNOSIS — M549 Dorsalgia, unspecified: Secondary | ICD-10-CM

## 2018-08-06 DIAGNOSIS — Z309 Encounter for contraceptive management, unspecified: Secondary | ICD-10-CM

## 2018-08-06 DIAGNOSIS — E1159 Type 2 diabetes mellitus with other circulatory complications: Secondary | ICD-10-CM | POA: Diagnosis not present

## 2018-08-06 DIAGNOSIS — K2100 Gastro-esophageal reflux disease with esophagitis, without bleeding: Secondary | ICD-10-CM

## 2018-08-06 DIAGNOSIS — K3184 Gastroparesis: Secondary | ICD-10-CM | POA: Diagnosis not present

## 2018-08-06 DIAGNOSIS — F419 Anxiety disorder, unspecified: Secondary | ICD-10-CM

## 2018-08-06 DIAGNOSIS — I152 Hypertension secondary to endocrine disorders: Secondary | ICD-10-CM

## 2018-08-06 DIAGNOSIS — I10 Essential (primary) hypertension: Secondary | ICD-10-CM

## 2018-08-06 DIAGNOSIS — E43 Unspecified severe protein-calorie malnutrition: Secondary | ICD-10-CM | POA: Diagnosis not present

## 2018-08-06 MED ORDER — CYCLOBENZAPRINE HCL 10 MG PO TABS: 10.0000 mg | ORAL_TABLET | Freq: Three times a day (TID) | ORAL | 0 refills | Status: DC | PRN

## 2018-08-06 MED ORDER — DICLOFENAC SODIUM 75 MG PO TBEC: 75.0000 mg | DELAYED_RELEASE_TABLET | Freq: Two times a day (BID) | ORAL | 0 refills | Status: DC

## 2018-08-06 NOTE — Assessment & Plan Note (Addendum)
Discussed options.  She is interested in stopping her peers completely.  Does not want to go back on OCPs.  Will start Depo-Provera.  She will come into the office next week for a pregnancy test and first injection.  Discussed potential for irregular spotting for the first several weeks to months and other potential side effects.

## 2018-08-06 NOTE — Assessment & Plan Note (Signed)
Stable since being discharged.  She will continue her current antiemetic regimen of Reglan, Zofran and Phenergan.  She will continue using Ativan as this also seems to be helping.  As noted below, she thinks that there is a significant component of her menstrual cycle that could be worsening her symptoms.  We will start Depo-Provera to see if this helps in this regard.

## 2018-08-06 NOTE — Assessment & Plan Note (Signed)
Continue management per endocrinology.

## 2018-08-06 NOTE — Progress Notes (Signed)
Chief Complaint:  [name] is a 28 y.o. female who presents today for a virtual TCM visit.  Assessment/Plan:  Contraception management Discussed options.  She is interested in stopping her peers completely.  Does not want to go back on OCPs.  Will start Depo-Provera.  She will come into the office next week for a pregnancy test and first injection.  Discussed potential for irregular spotting for the first several weeks to months and other potential side effects.  Diabetes mellitus type 1 ([place]) Continue management per endocrinology.  Protein-calorie malnutrition, severe ([name]) She has been doing well with homemade protein shakes.  We will continue this.  Hypertension associated with diabetes ([place]) Stable.  Continue losartan 25 mg daily.  Gastroparesis Stable since being discharged.  She will continue her current antiemetic regimen of Reglan, Zofran and Phenergan.  She will continue using Ativan as this also seems to be helping.  As noted below, she thinks that there is a significant component of her menstrual cycle that could be worsening her symptoms.  We will start Depo-Provera to see if this helps in this regard.  GERD (gastroesophageal reflux disease) Stable.  Continue Protonix 40 mg twice daily.  Anxiety Stable.  Continue Ativan 0.5 mg twice daily as needed.  Upper Back Pain No red flags.  Likely muscular strain from retching due to vomiting.  Given that this is been a recurrent issue for patient, will place referral to sports medicine for further evaluation/management.  In the meantime, will start short course of diclofenac and Flexeril.    Subjective:  HPI:  Summary of Hospital admission: Reason for admission: Nausea/Vomiting/Gastroparesis Date of admission: 07/29/2018 Date of discharge: 08/03/2018 Date of Interactive contact: 08/05/2018 Summary of Hospital course: Patient presented to the ED on 07/19/2018 with intractable vomiting.  She was admitted to the  hospital for a gastroparesis flare with intractable vomiting.  She was given IV antiemetics and was restarted on her home Ativan which significantly improved her symptoms.  While admitted, she had severe upper back pain related to strained muscles related to retching from vomiting.  She was started on Lidoderm patches which worked well while in the hospital.  She is eventually able to be switched back over to oral antiemetics and was able to tolerate liquids fine.  She was then discharged home.  Interim history outlined by problem:   #Gastroparesis  -Currently follows with GI and is undergoing evaluation at [facility] for possible GI pacemaker -She currently takes Reglan, Zofran, and Phenergan as needed for nausea.  She also takes hyoscyamine as needed for abdominal cramping. -She is on twice daily ativan which seems to be helping -She has noticed that symptoms seem to flareup about a week before her period starts.  She is interested in starting medication to help control periods to see if this helps with her gastroparesis.  # Depression/anxiety - On Remeron 44m nightly and ativan 0.564mtwice daily and doing well. - ROS: No reported SI or HI   # GERD -On Protonix 40 mg daily and tolerating well  # Essential Hypertension -On losartan 25 mg daily and tolerating well. -ROS: No reported chest pain or shortness of breath  # Protein Calorie Malnutrition -Uses supplemental shakes 1-2 times daily.  # Upper Back Pain -Feels like the lidocaine patches are no longer working well. -Has been on diclofenac and Flexeril the past which worked well.  Would like to restart these.  ROS: Per HPI, otherwise a complete review of systems  was negative.   PMH:  The following were reviewed and entered/updated in epic: Past Medical History:  Diagnosis Date  . Allergy   . Anemia   . Anxiety   . Blood transfusion without reported diagnosis    Dec 2018  . Cataract    right eye  . Depression   .  Diabetes type 1, uncontrolled ([place]) 11/14/2011   Since age 82  . Fibromyalgia   . Gastroparesis   . GERD (gastroesophageal reflux disease)   . Hypertension   . Infection    UTI April 2016   Patient Active Problem List   Diagnosis Date Noted  . Contraception management 08/06/2018  . Intractable vomiting with nausea 07/30/2018  . Cyclical vomiting syndrome 06/07/2018  . Diabetic gastroparesis associated with type 1 diabetes mellitus ([place]) 06/04/2018  . Anxiety 05/31/2018  . Peripheral edema 05/31/2018  . Normocytic anemia 07/10/2017  . GERD (gastroesophageal reflux disease) 06/23/2017  . 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 Diabetes mellitus type 1 ([place]) 04/28/2013  . Protein-calorie malnutrition, severe ([place]) 04/28/2013  . Hypertension associated with diabetes ([place]) 11/14/2011  . Gastroparesis 11/14/2011   Past Surgical History:  Procedure Laterality Date  . ANKLE SURGERY    . CHOLECYSTECTOMY  11/15/2011   Procedure: LAPAROSCOPIC CHOLECYSTECTOMY WITH INTRAOPERATIVE CHOLANGIOGRAM;  Surgeon: [name], MD;  Location: [facility];  Service: General;  Laterality: N/A;  . COLONOSCOPY    . COLONOSCOPY WITH PROPOFOL N/A 06/27/2017   Procedure: COLONOSCOPY WITH PROPOFOL;  Surgeon: Milus Banister, MD;  Location: [facility];  Service: Endoscopy;  Laterality: N/A;  . ESOPHAGOGASTRODUODENOSCOPY  12/03/2011   Procedure: ESOPHAGOGASTRODUODENOSCOPY (EGD);  Surgeon: Beryle Beams, MD;  Location: [facility];  Service: Endoscopy;  Laterality: N/A;  . FLEXIBLE SIGMOIDOSCOPY N/A 03/10/2017   Procedure: FLEXIBLE SIGMOIDOSCOPY;  Surgeon: Carol Ada, MD;  Location: [facility];  Service: Endoscopy;  Laterality: N/A;  . INCISION AND DRAINAGE PERIRECTAL ABSCESS N/A 03/01/2017   Procedure: IRRIGATION AND DEBRIDEMENT PERIRECTAL ABSCESS;  Surgeon: Alphonsa Overall, MD;  Location: [facility];  Service: General;  Laterality: N/A;  . IRRIGATION AND DEBRIDEMENT BUTTOCKS  N/A 03/23/2017   Procedure: IRRIGATION AND DEBRIDEMENT BUTTOCKS, SETON PLACEMENT;  Surgeon: [name], MD;  Location: [facility];  Service: General;  Laterality: N/A;  . LAPAROSCOPY  11/23/2011   Procedure: LAPAROSCOPY DIAGNOSTIC;  Surgeon: Edward Jolly, MD;  Location: [facility];  Service: General;  Laterality: N/A;  . SIGMOIDOSCOPY    . UPPER GASTROINTESTINAL ENDOSCOPY    . WISDOM TOOTH EXTRACTION      Family History  Problem Relation Age of Onset  . Diabetes Mother   . Hypertension Father   . Colon cancer Paternal Grandmother        pt thinks PGM was dx in her 41's  . Diabetes Paternal Grandmother   . Diabetes Maternal Grandmother   . Diabetes Maternal Grandfather   . Diabetes Paternal Grandfather   . Diabetes Other   . Esophageal cancer Neg Hx   . Liver cancer Neg Hx   . Pancreatic cancer Neg Hx   . Stomach cancer Neg Hx   . Rectal cancer Neg Hx     Medications- Reconciled discharge and current medications in Epic.  Current Outpatient Medications  Medication Sig Dispense Refill  . Continuous Blood Gluc Sensor (FREESTYLE LIBRE 14 DAY SENSOR) MISC 1 Device by Does not apply route every 14 (fourteen) days. 6 each 3  . dicyclomine (BENTYL) 20 MG tablet Take 1 tablet (  20 mg total) by mouth 3 (three) times daily as needed for spasms (abdominal cramping). 20 tablet 0  . feeding supplement, ENSURE ENLIVE, (ENSURE ENLIVE) LIQD Take 237 mLs by mouth 2 (two) times daily between meals. 237 mL 12  . furosemide (LASIX) 40 MG tablet Take 1 tablet (40 mg total) by mouth daily. 90 tablet 1  . hyoscyamine (LEVSIN SL) 0.125 MG SL tablet Place 1 tablet (0.125 mg total) under the tongue every 6 (six) hours as needed for cramping (abdominal pain). 30 tablet 0  . insulin lispro (HUMALOG) 100 UNIT/ML injection Inject 0.04 mLs (4 Units total) into the skin 3 (three) times daily with meals. 10 mL 0  . LANTUS 100 UNIT/ML injection Inject 0.05 mLs (5 Units total) into the skin at bedtime. Patient  using Vials 30 mL 3  . lidocaine (LIDODERM) 5 % Place 2 patches onto the skin daily. Remove & Discard patch within 12 hours or as directed by MD 14 patch 0  . LORazepam (ATIVAN) 0.5 MG tablet Take 1 tablet (0.5 mg total) by mouth 2 (two) times daily as needed for anxiety. 30 tablet 2  . losartan (COZAAR) 25 MG tablet Take 1 tablet (25 mg total) by mouth daily. 90 tablet 3  . metoCLOPramide (REGLAN) 10 MG tablet Take 1 tablet (10 mg total) by mouth every 6 (six) hours as needed for nausea or vomiting. 20 tablet 0  . mirtazapine (REMERON) 30 MG tablet Take 1 tablet (30 mg total) by mouth at bedtime. 30 tablet 5  . ondansetron (ZOFRAN) 4 MG tablet Take 1 tablet (4 mg total) by mouth every 8 (eight) hours as needed for nausea or vomiting (If necessary, dont combine with Reglan.). 20 tablet 0  . pantoprazole (PROTONIX) 40 MG tablet Take 1 tablet (40 mg total) by mouth 2 (two) times daily. 60 tablet 0  . promethazine (PHENERGAN) 12.5 MG suppository Place 1 suppository (12.5 mg total) rectally every 8 (eight) hours as needed for nausea or vomiting. Note: Do not take while taking oral phenergan 30 each 2  . promethazine (PHENERGAN) 12.5 MG tablet TAKE 1 TABLET BY MOUTH  EVERY 8 HOURS AS NEEDED FOR NAUSEA AND VOMITING (Patient taking differently: Take 12.5 mg by mouth every 8 (eight) hours as needed for nausea or vomiting. ) 90 tablet 0  . cyclobenzaprine (FLEXERIL) 10 MG tablet Take 1 tablet (10 mg total) by mouth 3 (three) times daily as needed for muscle spasms. 30 tablet 0  . diclofenac (VOLTAREN) 75 MG EC tablet Take 1 tablet (75 mg total) by mouth 2 (two) times daily. 30 tablet 0   No current facility-administered medications for this visit.     Allergies-reviewed and updated Allergies  Allergen Reactions  . Other Anaphylaxis    Reaction to [place] nuts   . Lactose Intolerance ([place]) Diarrhea    Social History   Socioeconomic History  . Marital status: Married    Spouse name: sergio  . Number  of children: 0  . Years of education: college  . Highest education level: Not on file  Occupational History  . Occupation: Publishing rights manager call center    Employer: [employer]  . Financial resource strain: Not on file  . Food insecurity:    Worry: Not on file    Inability: Not on file  . Transportation needs:    Medical: Not on file    Non-medical: Not on file  Tobacco Use  . Smoking status: Never Smoker  .  Smokeless tobacco: Never Used  Substance and Sexual Activity  . Alcohol use: No    Alcohol/week: 0.0 standard drinks    Frequency: Never  . Drug use: No  . Sexual activity: Not Currently    Partners: Male    Birth control/protection: None  Lifestyle  . Physical activity:    Days per week: Not on file    Minutes per session: Not on file  . Stress: Not on file  Relationships  . Social connections:    Talks on phone: Not on file    Gets together: Not on file    Attends religious service: Not on file    Active member of club or organization: Not on file    Attends meetings of clubs or organizations: Not on file    Relationship status: Not on file  Other Topics Concern  . Not on file  Social History Narrative  . Not on file        Objective:  Physical Exam: There were no vitals taken for this visit.  Gen: NAD, resting comfortably Pulm: Normal work of breathing MSK: No edema, cyanosis, or clubbing noted Skin: Warm, dry Neuro: Grossly normal, moves all extremities Psych: Normal affect and thought content  Virtual Visit via Video   I connected with [name] on [birth year] at  3:20 PM EDT by a video enabled telemedicine application and verified that I am speaking with the correct person using two identifiers. I discussed the limitations of evaluation and management by telemedicine and the availability of in person appointments. The patient expressed understanding and agreed to proceed.   Patient location: Home Provider location: [place] participating in the virtual visit: Myself and Patient     Algis Greenhouse. Jerline Pain, MD 08/06/2018 4:43 PM

## 2018-08-06 NOTE — Assessment & Plan Note (Signed)
Stable. Continue losartan 25mg daily

## 2018-08-06 NOTE — Assessment & Plan Note (Signed)
Stable.  Continue Ativan 0.5 mg twice daily as needed.

## 2018-08-06 NOTE — Assessment & Plan Note (Signed)
Stable.  Continue Protonix 40 mg twice daily.

## 2018-08-06 NOTE — Assessment & Plan Note (Signed)
She has been doing well with homemade protein shakes.  We will continue this.

## 2018-08-11 ENCOUNTER — Other Ambulatory Visit: Payer: Self-pay | Admitting: Gastroenterology

## 2018-08-11 ENCOUNTER — Other Ambulatory Visit: Payer: Self-pay | Admitting: Family Medicine

## 2018-08-11 NOTE — Telephone Encounter (Signed)
Rx Request 

## 2018-08-13 ENCOUNTER — Encounter: Payer: Self-pay | Admitting: *Deleted

## 2018-08-13 ENCOUNTER — Telehealth: Payer: 59 | Admitting: Nurse Practitioner

## 2018-08-13 ENCOUNTER — Encounter (INDEPENDENT_AMBULATORY_CARE_PROVIDER_SITE_OTHER): Payer: Self-pay

## 2018-08-13 ENCOUNTER — Telehealth: Payer: Self-pay | Admitting: *Deleted

## 2018-08-13 DIAGNOSIS — Z20822 Contact with and (suspected) exposure to covid-19: Secondary | ICD-10-CM

## 2018-08-13 MED ORDER — BENZONATATE 100 MG PO CAPS: 100.0000 mg | ORAL_CAPSULE | Freq: Three times a day (TID) | ORAL | 0 refills | Status: DC | PRN

## 2018-08-13 NOTE — Progress Notes (Signed)
E-Visit for Corona Virus Screening  Based on your current symptoms, you may very well have the virus, however your symptoms are mild. Currently, not all patients are being tested. If the symptoms are mild and there is not a known exposure, performing the test is not indicated. due to diabates, if start feeling worse you will need to go to [facility] fo see if you need to be tested.  You have been enrolled in MyChart Home Monitoring for COVID-19. Daily you will receive a questionnaire within the MyChart website. Our COVID-19 response team will be monitoring your responses daily.   Coronavirus disease 2019 (COVID-19) is a respiratory illness that can spread from person to person. The virus that causes COVID-19 is a new virus that was first identified in the country of [place] but is now found in multiple other countries and has spread to the [place].  Symptoms associated with the virus are mild to severe fever, cough, and shortness of breath. There is currently no vaccine to protect against COVID-19, and there is no specific antiviral treatment for the virus.   To be considered HIGH RISK for Coronavirus (COVID-19), you have to meet the following criteria:  . Traveled to [place], [place], [place], [place] or [place]; or in the [place] to [place], [place], [place], or [place]; and have fever, cough, and shortness of breath within the last 2 weeks of travel OR  . Been in close contact with a person diagnosed with COVID-19 within the last 2 weeks and have fever, cough, and shortness of breath  . IF YOU DO NOT MEET THESE CRITERIA, YOU ARE CONSIDERED LOW RISK FOR COVID-19.   It is vitally important that if you feel that you have an infection such as this virus or any other virus that you stay home and away from places where you may spread it to others.  You should self-quarantine for 14 days if you have symptoms that could potentially be coronavirus and avoid contact with people age 29 and  older.   You can use medication such as A prescription cough medication called Tessalon Perles 100 mg. You may take 1-2 capsules every 8 hours as needed for cough  You may also take acetaminophen (Tylenol) as needed for fever.   Reduce your risk of any infection by using the same precautions used for avoiding the common cold or flu:  Marland Kitchen Wash your hands often with soap and warm water for at least 20 seconds.  If soap and water are not readily available, use an alcohol-based hand sanitizer with at least 60% alcohol.  . If coughing or sneezing, cover your mouth and nose by coughing or sneezing into the elbow areas of your shirt or coat, into a tissue or into your sleeve (not your hands). . Avoid shaking hands with others and consider head nods or verbal greetings only. . Avoid touching your eyes, nose, or mouth with unwashed hands.  . Avoid close contact with people who are sick. . Avoid places or events with large numbers of people in one location, like concerts or sporting events. . Carefully consider travel plans you have or are making. . If you are planning any travel outside or inside the [place], visit the CDC's Travelers' Health webpage for the latest health notices. . If you have some symptoms but not all symptoms, continue to monitor at home and seek medical attention if your symptoms worsen. . If you are having a medical emergency, call 911.  HOME CARE . Only take medications as instructed by your medical team. . Drink plenty of fluids and get plenty of rest. . A steam or ultrasonic humidifier can help if you have congestion.   GET HELP RIGHT AWAY IF: . You develop worsening fever. . You become short of breath . You cough up blood. . Your symptoms become more severe MAKE SURE YOU   Understand these instructions.  Will watch your condition.  Will get help right away if you are not doing well or get worse.  Your e-visit answers were reviewed by a board certified advanced clinical  practitioner to complete your personal care plan.  Depending on the condition, your plan could have included both over the counter or prescription medications.  If there is a problem please reply once you have received a response from your provider. Your safety is important to [place].  If you have drug allergies check your prescription carefully.    You can use MyChart to ask questions about today's visit, request a non-urgent call back, or ask for a work or school excuse for 24 hours related to this e-Visit. If it has been greater than 24 hours you will need to follow up with your provider, or enter a new e-Visit to address those concerns. You will get an e-mail in the next two days asking about your experience.  I hope that your e-visit has been valuable and will speed your recovery. Thank you for using e-visits.  5-10 minutes spent reviewing and documenting in chart.

## 2018-08-13 NOTE — Telephone Encounter (Signed)
This encounter was created in error - please disregard.

## 2018-08-13 NOTE — Telephone Encounter (Signed)
Patient calling after evist today to make PCP aware that she was seen for suspected Covid-19. Pt states she was scheduled to come in for an appt next week but will not be able to because she will be quarantined.Pt has experienced a temperature of 100.8, cough, body aches and sore throat for a few days. Pt reports that she is also a diabetic. BPA was also sent via MyChart Companion for worsening worsening cough,vomiting and temperature of 100.8. Pt advised to continue care plan as advised by provider during evisit today. Advised pt that PCP would be notified of current symptoms. Pt verbalized understanding. Pt can be contacted at [phone].

## 2018-08-14 ENCOUNTER — Encounter (INDEPENDENT_AMBULATORY_CARE_PROVIDER_SITE_OTHER): Payer: Self-pay

## 2018-08-15 ENCOUNTER — Telehealth: Payer: Self-pay

## 2018-08-15 ENCOUNTER — Encounter (INDEPENDENT_AMBULATORY_CARE_PROVIDER_SITE_OTHER): Payer: Self-pay

## 2018-08-15 NOTE — Telephone Encounter (Signed)
Patient returned call. Patient stated that record high temperature was last night.  Pt was advised of all recommendations. Patient verbalized understanding and denied questions.

## 2018-08-15 NOTE — Telephone Encounter (Signed)
Patient sent in  mychart covid response questionnaire. Pt indicated worsening cough and 102.4 *F reading since last questionnaire.  Attempted to reach patient due to worsening symptoms.   Attempted to advise patient  to stay hydrated and push oral fluids, if they can tolerate. Avoid using multiple blanks or layer clothing to prevent overheating .Avoid over use of antipyretic medications for fevers less than 101? F., as fever helps to fight the infection. Treat temperature > 101? F. With OTC anti-pyretic medications. They may alternate tylenol and Ibuprofen every 3 hours as needed for fevers > 101?. Pt to contact PCP if fever remains for more than 3 days or if temperature becomes greater than 103? F and they are unable to get it down with the use of OTC medication.   Attempted to advised pt to contact PCP if OTC medications are not helping with cough, they are unable to sleep at night, or cough becomes productive w/ sputum that maybe yellow or green in color.   Pt did not answer. VM left advising pt who I was and requesting call back. Will attempt again shortly.

## 2018-08-16 NOTE — Telephone Encounter (Signed)
Called pt and left vm to schedule virtual

## 2018-08-16 NOTE — Telephone Encounter (Signed)
Please call again to see if she needs to schedule an appointment. No need to forward back to me.

## 2018-08-16 NOTE — Telephone Encounter (Signed)
Please schedule virtual visit for today

## 2018-08-17 ENCOUNTER — Encounter (INDEPENDENT_AMBULATORY_CARE_PROVIDER_SITE_OTHER): Payer: Self-pay

## 2018-08-17 ENCOUNTER — Telehealth: Payer: Self-pay | Admitting: *Deleted

## 2018-08-17 NOTE — Telephone Encounter (Signed)
Contacted pt in regard to her my chart companion response of vomiting and temp 100.3; she says that 100.3 was the highest temp that she had on 08/16/2018; this morning it was 99.0 at 0715 08/17/2018; she says that she took tylenol cold and flu medication; the pt says that she did vomit today at 0815 but she can not quantify the amount; pt given the following recommendations:  2: CLEAR LIQUIDS: Try to sip small amounts (1 tablespoon or 15 ml) of liquid frequently (every 5 minutes) for 8 hours, rather than trying to drink a lot of liquid all at one time. * Sip water or a 1/2 strength sports drink (e.g., Gatorade or Powerade). * Other options: 1/2 strength flat lemon-lime soda or ginger ale. * After 4 hours without vomiting, increase the amount.    3: SOLID FOOD: * You may begin eating bland foods after 8 hours without vomiting. * Start with saltine crackers, white bread, rice, mashed potatoes, cereal, applesauce, etc. * You can resume a normal diet in 24-48 hours.   4: AVOID NON-ESSENTIAL MEDS: * Discontinue all vitamins and non-prescription medicines for 24 hours. (Reason: may make vomiting worse.) * Avoid NSAIDs, which can cause gastritis * Call if vomiting a prescription medicine.   5: CALL BACK IF: * Vomit contains bile (green color) * Constant stomach pain lasting more than 2 hours * Signs of dehydration (e.g., no urination over 12 hours, very lightheaded) * You become worse.   Pt also instructed to contact her provider further recommendations; she verbalizes understanding; the pt sees Dr Jacquiline Doe, [facility]; will route to provider for notification.

## 2018-08-18 ENCOUNTER — Telehealth: Payer: Self-pay | Admitting: *Deleted

## 2018-08-18 ENCOUNTER — Encounter (INDEPENDENT_AMBULATORY_CARE_PROVIDER_SITE_OTHER): Payer: Self-pay

## 2018-08-18 NOTE — Telephone Encounter (Signed)
Noted. Has appt scheduled for tomorrow.  Algis Greenhouse. Jerline Pain, MD 08/18/2018 8:02 AM

## 2018-08-18 NOTE — Telephone Encounter (Signed)
Contacted pt due to my chart companion response of worsening appetite; the pt states that she does not want to eat because she can't taste it, and it's hard to eat due to coughing; the pt says that she has been using tessalon perles; recommendations made: 7: COUGH MEDICINES:  * OTC COUGH SYRUPS: The most common cough suppressant in OTC cough medications is dextromethorphan. Often the letters 'DM' appear in the name. * OTC COUGH DROPS: Cough drops can help a lot, especially for mild coughs. They reduce coughing by soothing your irritated throat and removing that tickle sensation in the back of the throat. Cough drops also have the advantage of portability - you can carry them with you. * HOME REMEDY - HARD CANDY: Hard candy works just as well as medicine-flavored OTC cough drops. People who have diabetes should use sugar-free candy. * HOME REMEDY - HONEY: This old home remedy has been shown to help decrease coughing at night. The adult dosage is 2 teaspoons (10 ml) at bedtime. Honey should not be given to infants under one year of age.    8: CAUTION - DEXTROMETHORPHAN: * Do not try to completely suppress coughs that produce mucus and phlegm. Remember that coughing is helpful in bringing up mucus from the lungs and preventing pneumoni  Pt also advised to contact her PCP for further recommendations; she sees Dr Jacquiline Doe, [facility]; will route to provider for notification.

## 2018-08-19 ENCOUNTER — Encounter: Payer: Self-pay | Admitting: Family Medicine

## 2018-08-19 ENCOUNTER — Ambulatory Visit (INDEPENDENT_AMBULATORY_CARE_PROVIDER_SITE_OTHER): Payer: 59 | Admitting: Family Medicine

## 2018-08-19 DIAGNOSIS — K3184 Gastroparesis: Secondary | ICD-10-CM

## 2018-08-19 DIAGNOSIS — J189 Pneumonia, unspecified organism: Secondary | ICD-10-CM | POA: Diagnosis not present

## 2018-08-19 MED ORDER — LORAZEPAM 0.5 MG PO TABS: ORAL_TABLET | ORAL | 5 refills | Status: DC

## 2018-08-19 NOTE — Progress Notes (Signed)
    Chief Complaint:  [name] is a 28 y.o. female who presents today for a virtual office visit with a chief complaint of fever.   Assessment/Plan:  Pneumonia Seems to be improving with the Levaquin.  Advised her to continue for full course.  She will continue using Tessalon for cough.  Gastroparesis Unfortunately has had a mild flare due to her pneumonia.  She will continue her antiemetic regimen of Reglan, Zofran, and Phenergan.  Ativan also seems to be significantly helping.  We will refill this.  Database was reviewed without red flags.    Subjective:  HPI:  Fever  Symptoms started 4 to 5 days ago.  Went to urgent care 4 days ago.  She was reportedly tested for COVID-19 which was negative.  She had an x-ray done which was reportedly positive for pneumonia.  She was started on Levaquin 2 days ago.  Symptoms seem to have modestly improved over that time.  Still has occasional cough.  Has a significant amount of malaise, nausea, and decreased appetite.  No further fevers. No other treatments tried. No other obvious alleviating or aggravating factors.   ROS: Per HPI  PMH: She reports that she has never smoked. She has never used smokeless tobacco. She reports that she does not drink alcohol or use drugs.      Objective/Observations  Physical Exam: Gen: NAD, resting comfortably Pulm: Normal work of breathing Neuro: Grossly normal, moves all extremities Psych: Normal affect and thought content  Virtual Visit via Video   I connected with Yeraldin Pross Barich on 08/19/18 at  3:00 PM EDT by a video enabled telemedicine application and verified that I am speaking with the correct person using two identifiers. I discussed the limitations of evaluation and management by telemedicine and the availability of in person appointments. The patient expressed understanding and agreed to proceed.  Patient was able to successfully connect to our video application, however due to technical  difficulties, the encounter was completed via audio only.  Patient location: Home Provider location: [place] [employer] [facility] Persons participating in the virtual visit: Myself and Patient     Katina Degree. Jimmey Ralph, MD 08/19/2018 3:19 PM

## 2018-08-19 NOTE — Telephone Encounter (Signed)
Noted. Pt has f/u appointment later today.

## 2018-08-19 NOTE — Assessment & Plan Note (Signed)
Unfortunately has had a mild flare due to her pneumonia.  She will continue her antiemetic regimen of Reglan, Zofran, and Phenergan.  Ativan also seems to be significantly helping.  We will refill this.  Database was reviewed without red flags.

## 2018-08-20 ENCOUNTER — Encounter (HOSPITAL_COMMUNITY): Payer: Self-pay | Admitting: Emergency Medicine

## 2018-08-20 ENCOUNTER — Inpatient Hospital Stay (HOSPITAL_COMMUNITY)
Admission: EM | Admit: 2018-08-20 | Discharge: 2018-08-22 | DRG: 195 | Disposition: A | Discharge status: Home / Self Care | Payer: 59 | Attending: Internal Medicine | Admitting: Internal Medicine

## 2018-08-20 ENCOUNTER — Emergency Department (HOSPITAL_COMMUNITY): Payer: 59

## 2018-08-20 ENCOUNTER — Encounter (INDEPENDENT_AMBULATORY_CARE_PROVIDER_SITE_OTHER): Payer: Self-pay

## 2018-08-20 ENCOUNTER — Other Ambulatory Visit: Payer: Self-pay

## 2018-08-20 DIAGNOSIS — R451 Restlessness and agitation: Secondary | ICD-10-CM | POA: Diagnosis present

## 2018-08-20 DIAGNOSIS — E109 Type 1 diabetes mellitus without complications: Secondary | ICD-10-CM | POA: Diagnosis present

## 2018-08-20 DIAGNOSIS — R197 Diarrhea, unspecified: Secondary | ICD-10-CM | POA: Diagnosis present

## 2018-08-20 DIAGNOSIS — F411 Generalized anxiety disorder: Secondary | ICD-10-CM | POA: Diagnosis present

## 2018-08-20 DIAGNOSIS — I152 Hypertension secondary to endocrine disorders: Secondary | ICD-10-CM | POA: Diagnosis present

## 2018-08-20 DIAGNOSIS — Z20828 Contact with and (suspected) exposure to other viral communicable diseases: Secondary | ICD-10-CM | POA: Diagnosis present

## 2018-08-20 DIAGNOSIS — E739 Lactose intolerance, unspecified: Secondary | ICD-10-CM | POA: Diagnosis present

## 2018-08-20 DIAGNOSIS — E1159 Type 2 diabetes mellitus with other circulatory complications: Secondary | ICD-10-CM | POA: Diagnosis not present

## 2018-08-20 DIAGNOSIS — F418 Other specified anxiety disorders: Secondary | ICD-10-CM | POA: Diagnosis present

## 2018-08-20 DIAGNOSIS — D638 Anemia in other chronic diseases classified elsewhere: Secondary | ICD-10-CM | POA: Diagnosis present

## 2018-08-20 DIAGNOSIS — Y95 Nosocomial condition: Secondary | ICD-10-CM | POA: Diagnosis present

## 2018-08-20 DIAGNOSIS — R0602 Shortness of breath: Secondary | ICD-10-CM | POA: Diagnosis not present

## 2018-08-20 DIAGNOSIS — G8929 Other chronic pain: Secondary | ICD-10-CM | POA: Diagnosis present

## 2018-08-20 DIAGNOSIS — R630 Anorexia: Secondary | ICD-10-CM | POA: Diagnosis present

## 2018-08-20 DIAGNOSIS — K3184 Gastroparesis: Secondary | ICD-10-CM | POA: Diagnosis present

## 2018-08-20 DIAGNOSIS — R109 Unspecified abdominal pain: Secondary | ICD-10-CM | POA: Diagnosis not present

## 2018-08-20 DIAGNOSIS — E876 Hypokalemia: Secondary | ICD-10-CM | POA: Diagnosis present

## 2018-08-20 DIAGNOSIS — Z794 Long term (current) use of insulin: Secondary | ICD-10-CM

## 2018-08-20 DIAGNOSIS — E1043 Type 1 diabetes mellitus with diabetic autonomic (poly)neuropathy: Secondary | ICD-10-CM | POA: Diagnosis present

## 2018-08-20 DIAGNOSIS — J189 Pneumonia, unspecified organism: Secondary | ICD-10-CM | POA: Diagnosis present

## 2018-08-20 DIAGNOSIS — D649 Anemia, unspecified: Secondary | ICD-10-CM | POA: Diagnosis present

## 2018-08-20 DIAGNOSIS — F330 Major depressive disorder, recurrent, mild: Secondary | ICD-10-CM | POA: Diagnosis present

## 2018-08-20 DIAGNOSIS — E1069 Type 1 diabetes mellitus with other specified complication: Secondary | ICD-10-CM

## 2018-08-20 DIAGNOSIS — Z800 Family history of malignant neoplasm of digestive organs: Secondary | ICD-10-CM | POA: Diagnosis not present

## 2018-08-20 DIAGNOSIS — I10 Essential (primary) hypertension: Secondary | ICD-10-CM | POA: Diagnosis not present

## 2018-08-20 DIAGNOSIS — Z91018 Allergy to other foods: Secondary | ICD-10-CM | POA: Diagnosis not present

## 2018-08-20 DIAGNOSIS — Z8249 Family history of ischemic heart disease and other diseases of the circulatory system: Secondary | ICD-10-CM | POA: Diagnosis not present

## 2018-08-20 DIAGNOSIS — Z833 Family history of diabetes mellitus: Secondary | ICD-10-CM

## 2018-08-20 DIAGNOSIS — R112 Nausea with vomiting, unspecified: Secondary | ICD-10-CM

## 2018-08-20 LAB — PROCALCITONIN: Procalcitonin: <0.1 ng/mL

## 2018-08-20 LAB — GLUCOSE, CAPILLARY
Glucose-Capillary: 208 mg/dL — ABNORMAL HIGH (ref 70–99)
Glucose-Capillary: 230 mg/dL — ABNORMAL HIGH (ref 70–99)
Glucose-Capillary: 189 mg/dL — ABNORMAL HIGH (ref 70–99)
Glucose-Capillary: 262 mg/dL — ABNORMAL HIGH (ref 70–99)

## 2018-08-20 LAB — CBC
HCT: 30 % — ABNORMAL LOW (ref 36.0–46.0)
Hemoglobin: 9.9 g/dL — ABNORMAL LOW (ref 12.0–15.0)
MCH: 27.8 pg (ref 26.0–34.0)
MCHC: 33 g/dL (ref 30.0–36.0)
MCV: 84.3 fL (ref 80.0–100.0)
Platelets: 375 10*3/uL (ref 150–400)
RBC: 3.56 MIL/uL — ABNORMAL LOW (ref 3.87–5.11)
RDW: 11.9 % (ref 11.5–15.5)
WBC: 9.6 10*3/uL (ref 4.0–10.5)
nRBC: 0 % (ref 0.0–0.2)

## 2018-08-20 LAB — URINALYSIS, ROUTINE W REFLEX MICROSCOPIC
Bilirubin Urine: NEGATIVE
Glucose, UA: >=500 mg/dL — AB
Ketones, ur: NEGATIVE mg/dL
Leukocytes,Ua: NEGATIVE
Nitrite: NEGATIVE
Protein, ur: 100 mg/dL — AB
Specific Gravity, Urine: 1.005 (ref 1.005–1.030)
pH: 6 (ref 5.0–8.0)

## 2018-08-20 LAB — COMPREHENSIVE METABOLIC PANEL
ALT: 8 U/L (ref 0–44)
AST: 10 U/L — ABNORMAL LOW (ref 15–41)
Albumin: 2.5 g/dL — ABNORMAL LOW (ref 3.5–5.0)
Alkaline Phosphatase: 108 U/L (ref 38–126)
Anion gap: 13 (ref 5–15)
BUN: 6 mg/dL (ref 6–20)
CO2: 31 mmol/L (ref 22–32)
Calcium: 9.1 mg/dL (ref 8.9–10.3)
Chloride: 94 mmol/L — ABNORMAL LOW (ref 98–111)
Creatinine, Ser: 1.05 mg/dL — ABNORMAL HIGH (ref 0.44–1.00)
GFR calc Af Amer: >60 mL/min (ref >60)
GFR calc non Af Amer: >60 mL/min (ref >60)
Glucose, Bld: 121 mg/dL — ABNORMAL HIGH (ref 70–99)
Potassium: 3.7 mmol/L (ref 3.5–5.1)
Sodium: 138 mmol/L (ref 135–145)
Total Bilirubin: 0.6 mg/dL (ref 0.3–1.2)
Total Protein: 7.9 g/dL (ref 6.5–8.1)

## 2018-08-20 LAB — LIPASE, BLOOD: Lipase: 20 U/L (ref 11–51)

## 2018-08-20 LAB — LACTIC ACID, PLASMA: Lactic Acid, Venous: 1.5 mmol/L (ref 0.5–1.9)

## 2018-08-20 LAB — RAPID URINE DRUG SCREEN, HOSP PERFORMED
Amphetamines: NOT DETECTED
Barbiturates: NOT DETECTED
Benzodiazepines: NOT DETECTED
Cocaine: NOT DETECTED
Opiates: NOT DETECTED
Tetrahydrocannabinol: POSITIVE — AB

## 2018-08-20 LAB — I-STAT BETA HCG BLOOD, ED (MC, WL, AP ONLY): I-stat hCG, quantitative: <5 m[IU]/mL (ref <5)

## 2018-08-20 LAB — SARS CORONAVIRUS 2 BY RT PCR (HOSPITAL ORDER, PERFORMED IN ~~LOC~~ HOSPITAL LAB): SARS Coronavirus 2: NEGATIVE

## 2018-08-20 LAB — STREP PNEUMONIAE URINARY ANTIGEN: Strep Pneumo Urinary Antigen: NEGATIVE

## 2018-08-20 IMAGING — CR CHEST - 2 VIEW
2 series · 2 of 2 positions shown · non-contrast
Comparison: [DATE] and earlier.

CLINICAL DATA: 28-year-old female recently diagnosed with
pneumonia. Shortness of breath and fever of 102.7. Reportedly
[KO] negative.

EXAM:
CHEST - 2 VIEW

[chest ap]
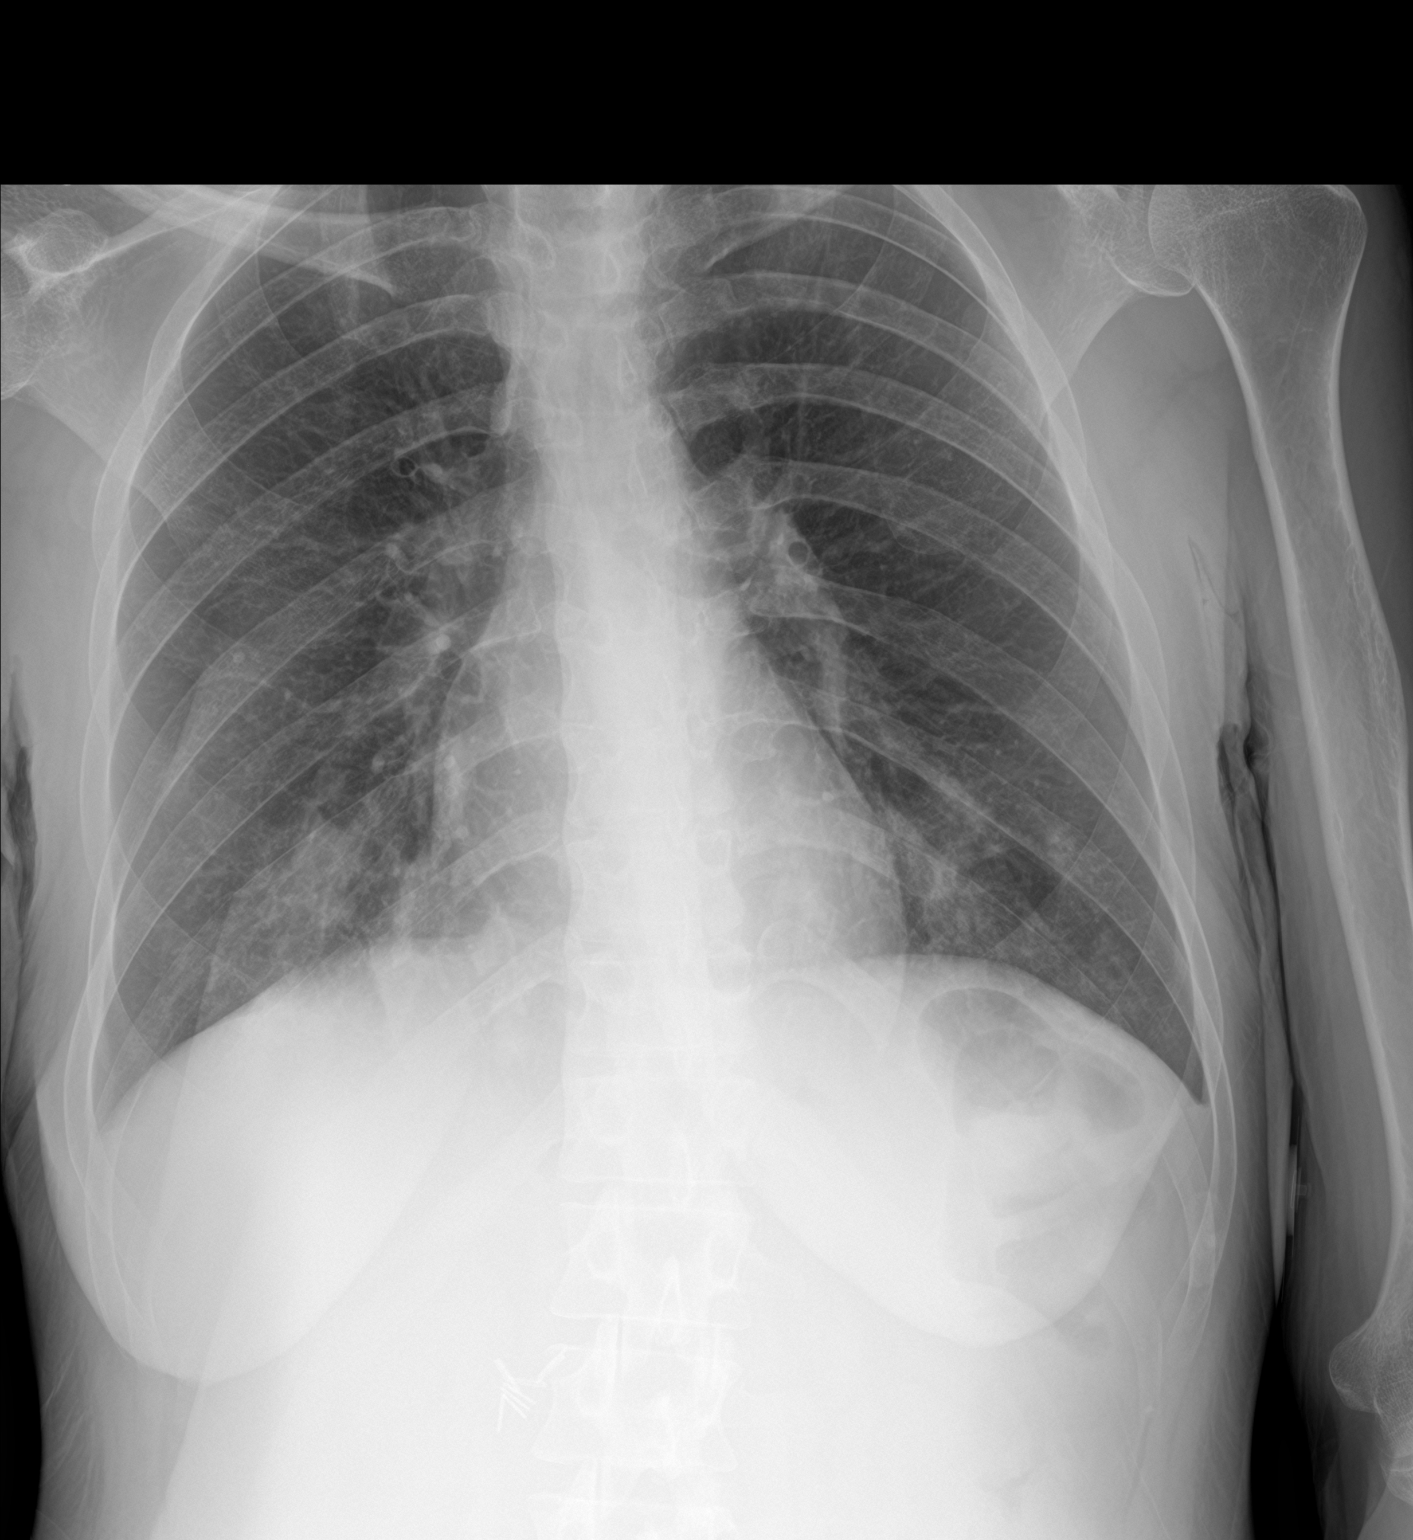

[chest lat]
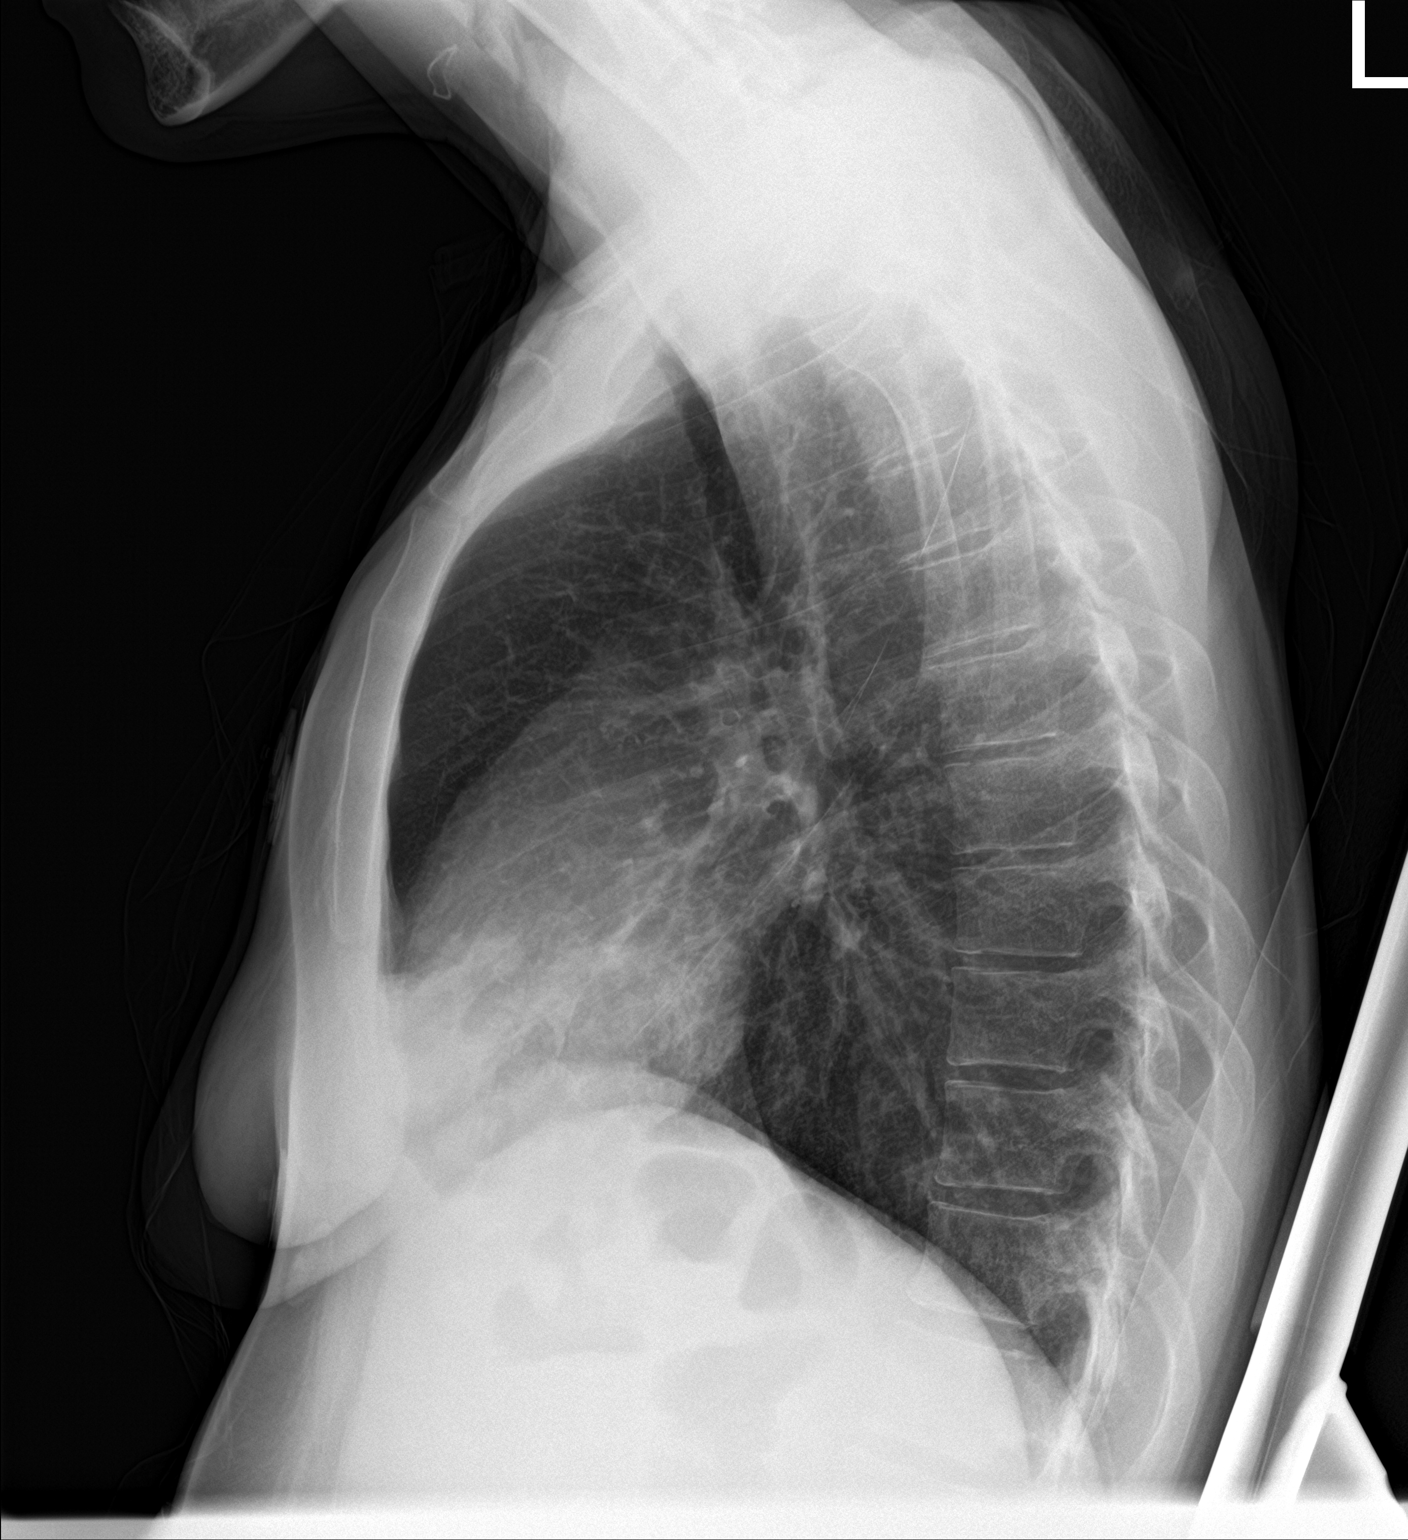

[2 of 2 positions shown; findings below may reference images not displayed]

FINDINGS: Confluent airspace opacity in the right middle lobe and lingula on
both views. Elsewhere the lungs appear clear. No pleural effusion.
Stable lung volumes. Normal cardiac size and mediastinal contours.
Visualized tracheal air column is within normal limits. No acute
osseous abnormality identified. Negative visible bowel gas pattern.
Stable cholecystectomy clips.
IMPRESSION: Confluent right middle lobe and lingula opacity compatible with
bilateral pneumonia. No pleural effusion.

## 2018-08-20 MED ORDER — SODIUM CHLORIDE 0.9% FLUSH
3.0000 mL | Freq: Two times a day (BID) | INTRAVENOUS | Status: DC
  Administered 2018-08-20 – 2018-08-21 (×3): 3 mL via INTRAVENOUS

## 2018-08-20 MED ORDER — SODIUM CHLORIDE 0.9 % IV SOLN
250.0000 mL | INTRAVENOUS | Status: DC | PRN
  Administered 2018-08-21 (×3): 250 mL via INTRAVENOUS

## 2018-08-20 MED ORDER — HYDROMORPHONE HCL 1 MG/ML IJ SOLN
1.0000 mg | INTRAMUSCULAR | Status: DC | PRN
  Administered 2018-08-20: 1 mg via INTRAVENOUS
  Filled 2018-08-20: qty 1

## 2018-08-20 MED ORDER — PANTOPRAZOLE SODIUM 40 MG PO TBEC
40.0000 mg | DELAYED_RELEASE_TABLET | Freq: Two times a day (BID) | ORAL | Status: DC
  Administered 2018-08-20 – 2018-08-22 (×4): 40 mg via ORAL
  Filled 2018-08-20 (×6): qty 1

## 2018-08-20 MED ORDER — ONDANSETRON HCL 4 MG PO TABS
4.0000 mg | ORAL_TABLET | Freq: Four times a day (QID) | ORAL | Status: DC | PRN
  Administered 2018-08-21: 4 mg via ORAL
  Filled 2018-08-20: qty 1

## 2018-08-20 MED ORDER — LIDOCAINE 5 % EX PTCH
2.0000 | MEDICATED_PATCH | Freq: Every day | CUTANEOUS | Status: DC | PRN
  Administered 2018-08-20 – 2018-08-22 (×2): 2 via TRANSDERMAL
  Filled 2018-08-20 (×2): qty 2

## 2018-08-20 MED ORDER — LEVOFLOXACIN IN D5W 750 MG/150ML IV SOLN
750.0000 mg | Freq: Once | INTRAVENOUS | Status: DC
  Filled 2018-08-20: qty 150

## 2018-08-20 MED ORDER — INSULIN GLARGINE 100 UNIT/ML ~~LOC~~ SOLN
3.0000 [IU] | Freq: Every day | SUBCUTANEOUS | Status: DC
  Filled 2018-08-20: qty 0.03

## 2018-08-20 MED ORDER — METOCLOPRAMIDE HCL 5 MG/ML IJ SOLN
10.0000 mg | Freq: Three times a day (TID) | INTRAMUSCULAR | Status: DC
  Administered 2018-08-20 – 2018-08-22 (×6): 10 mg via INTRAVENOUS
  Filled 2018-08-20 (×6): qty 2

## 2018-08-20 MED ORDER — SODIUM CHLORIDE 0.9 % IV SOLN
1.0000 g | Freq: Three times a day (TID) | INTRAVENOUS | Status: DC
  Filled 2018-08-20 (×5): qty 1

## 2018-08-20 MED ORDER — INSULIN GLARGINE 100 UNIT/ML ~~LOC~~ SOLN
5.0000 [IU] | Freq: Every day | SUBCUTANEOUS | Status: DC
  Administered 2018-08-20 – 2018-08-21 (×2): 5 [IU] via SUBCUTANEOUS
  Filled 2018-08-20 (×3): qty 0.05

## 2018-08-20 MED ORDER — LORAZEPAM 0.5 MG PO TABS
0.5000 mg | ORAL_TABLET | Freq: Two times a day (BID) | ORAL | Status: DC | PRN
  Filled 2018-08-20: qty 1

## 2018-08-20 MED ORDER — ENSURE ENLIVE PO LIQD
237.0000 mL | Freq: Two times a day (BID) | ORAL | Status: DC
  Administered 2018-08-21 – 2018-08-22 (×4): 237 mL via ORAL

## 2018-08-20 MED ORDER — SODIUM CHLORIDE 0.9% FLUSH
3.0000 mL | INTRAVENOUS | Status: DC | PRN
  Administered 2018-08-21: 3 mL via INTRAVENOUS
  Filled 2018-08-20: qty 3

## 2018-08-20 MED ORDER — ACETAMINOPHEN 325 MG PO TABS: 650.0000 mg | ORAL_TABLET | Freq: Four times a day (QID) | ORAL | Status: DC | PRN

## 2018-08-20 MED ORDER — ONDANSETRON HCL 4 MG/2ML IJ SOLN: 4.0000 mg | Freq: Once | INTRAMUSCULAR | Status: DC

## 2018-08-20 MED ORDER — DICYCLOMINE HCL 20 MG PO TABS
20.0000 mg | ORAL_TABLET | Freq: Three times a day (TID) | ORAL | Status: DC | PRN
  Filled 2018-08-20: qty 1

## 2018-08-20 MED ORDER — SODIUM CHLORIDE 0.9 % IV BOLUS (SEPSIS)
1000.0000 mL | Freq: Once | INTRAVENOUS | Status: AC
  Administered 2018-08-20: 1000 mL via INTRAVENOUS

## 2018-08-20 MED ORDER — ONDANSETRON HCL 4 MG/2ML IJ SOLN
4.0000 mg | Freq: Four times a day (QID) | INTRAMUSCULAR | Status: DC | PRN
  Administered 2018-08-20: 4 mg via INTRAVENOUS
  Filled 2018-08-20: qty 2

## 2018-08-20 MED ORDER — VANCOMYCIN HCL IN DEXTROSE 1-5 GM/200ML-% IV SOLN
1000.0000 mg | Freq: Two times a day (BID) | INTRAVENOUS | Status: DC
  Administered 2018-08-20 – 2018-08-21 (×3): 1000 mg via INTRAVENOUS
  Filled 2018-08-20 (×4): qty 200

## 2018-08-20 MED ORDER — MORPHINE SULFATE (PF) 4 MG/ML IV SOLN
4.0000 mg | Freq: Once | INTRAVENOUS | Status: AC
  Administered 2018-08-20: 4 mg via INTRAVENOUS
  Filled 2018-08-20: qty 1

## 2018-08-20 MED ORDER — HYDROMORPHONE HCL 1 MG/ML IJ SOLN
0.5000 mg | INTRAMUSCULAR | Status: DC | PRN
  Administered 2018-08-20 – 2018-08-22 (×9): 0.5 mg via INTRAVENOUS
  Filled 2018-08-20 (×9): qty 0.5

## 2018-08-20 MED ORDER — LORAZEPAM 2 MG/ML IJ SOLN
1.0000 mg | Freq: Four times a day (QID) | INTRAMUSCULAR | Status: DC | PRN
  Administered 2018-08-20 – 2018-08-22 (×5): 1 mg via INTRAVENOUS
  Filled 2018-08-20 (×5): qty 1

## 2018-08-20 MED ORDER — INSULIN ASPART 100 UNIT/ML ~~LOC~~ SOLN
0.0000 [IU] | Freq: Three times a day (TID) | SUBCUTANEOUS | Status: DC
  Administered 2018-08-20 (×2): 3 [IU] via SUBCUTANEOUS
  Administered 2018-08-20: 2 [IU] via SUBCUTANEOUS
  Administered 2018-08-21: 7 [IU] via SUBCUTANEOUS
  Administered 2018-08-21 – 2018-08-22 (×2): 2 [IU] via SUBCUTANEOUS

## 2018-08-20 MED ORDER — SODIUM CHLORIDE 0.9% FLUSH: 3.0000 mL | Freq: Once | INTRAVENOUS | Status: DC

## 2018-08-20 MED ORDER — PROMETHAZINE HCL 25 MG/ML IJ SOLN: 25.0000 mg | Freq: Three times a day (TID) | INTRAMUSCULAR | Status: DC | PRN

## 2018-08-20 MED ORDER — ONDANSETRON HCL 4 MG/2ML IJ SOLN
4.0000 mg | Freq: Four times a day (QID) | INTRAMUSCULAR | Status: DC | PRN
  Filled 2018-08-20: qty 2

## 2018-08-20 MED ORDER — MIRTAZAPINE 30 MG PO TABS
30.0000 mg | ORAL_TABLET | Freq: Every day | ORAL | Status: DC
  Administered 2018-08-20 – 2018-08-21 (×2): 30 mg via ORAL
  Filled 2018-08-20: qty 1
  Filled 2018-08-20: qty 2
  Filled 2018-08-20 (×2): qty 1
  Filled 2018-08-20: qty 2

## 2018-08-20 MED ORDER — ACETAMINOPHEN 650 MG RE SUPP: 650.0000 mg | Freq: Four times a day (QID) | RECTAL | Status: DC | PRN

## 2018-08-20 MED ORDER — CYCLOBENZAPRINE HCL 10 MG PO TABS
10.0000 mg | ORAL_TABLET | Freq: Three times a day (TID) | ORAL | Status: DC | PRN
  Administered 2018-08-20 – 2018-08-22 (×3): 10 mg via ORAL
  Filled 2018-08-20 (×3): qty 1

## 2018-08-20 MED ORDER — INSULIN ASPART 100 UNIT/ML ~~LOC~~ SOLN
2.0000 [IU] | Freq: Three times a day (TID) | SUBCUTANEOUS | Status: DC
  Administered 2018-08-21: 2 [IU] via SUBCUTANEOUS

## 2018-08-20 MED ORDER — ENOXAPARIN SODIUM 40 MG/0.4ML ~~LOC~~ SOLN
40.0000 mg | SUBCUTANEOUS | Status: DC
  Administered 2018-08-21 – 2018-08-22 (×2): 40 mg via SUBCUTANEOUS
  Filled 2018-08-20 (×2): qty 0.4

## 2018-08-20 MED ORDER — SODIUM CHLORIDE 0.9 % IV SOLN
2.0000 g | Freq: Three times a day (TID) | INTRAVENOUS | Status: DC
  Administered 2018-08-20 – 2018-08-22 (×7): 2 g via INTRAVENOUS
  Filled 2018-08-20 (×9): qty 2

## 2018-08-20 MED ORDER — HYDROCODONE-ACETAMINOPHEN 5-325 MG PO TABS: 1.0000 | ORAL_TABLET | ORAL | Status: DC | PRN

## 2018-08-20 MED ORDER — INSULIN ASPART 100 UNIT/ML ~~LOC~~ SOLN
0.0000 [IU] | Freq: Every day | SUBCUTANEOUS | Status: DC
  Administered 2018-08-20: 3 [IU] via SUBCUTANEOUS

## 2018-08-20 MED ORDER — PROMETHAZINE HCL 25 MG/ML IJ SOLN
25.0000 mg | Freq: Once | INTRAMUSCULAR | Status: AC | PRN
  Administered 2018-08-20: 25 mg via INTRAVENOUS
  Filled 2018-08-20: qty 1

## 2018-08-20 NOTE — ED Triage Notes (Signed)
Pt states she was diagnosed with pneumonia on Monday at [facility] and had negative COVID test.  Reports fever (TMax 102.7), productive cough with "gray" phlegm, sore throat, generalized body aches, and vomiting.  Taking antibiotics for PNA.

## 2018-08-20 NOTE — ED Provider Notes (Addendum)
TIME SEEN: 4:03 AM  CHIEF COMPLAINT: Chest pain, shortness of breath, vomiting, diarrhea  HPI: Patient is a 28 year old female with history of diabetes, gastroparesis, hypertension who presents to the emergency department with 1 week of shortness of breath, cough and fevers.  Was diagnosed with pneumonia and has been on Levaquin.  States today she started vomiting and is unable to keep down her antibiotic.  Is also had diarrhea.  No abdominal pain currently.  Is complaining of chest pain from vomiting.  No known history of PE or DVT.  No sick contacts.  Had recent negative coronavirus testing.  ROS: See HPI Constitutional:  fever  Eyes: no drainage  ENT: no runny nose   Cardiovascular:  chest pain  Resp: SOB  GI: diarrhea and vomiting GU: no dysuria Integumentary: no rash  Allergy: no hives  Musculoskeletal: no leg swelling  Neurological: no slurred speech ROS otherwise negative  PAST MEDICAL HISTORY/PAST SURGICAL HISTORY:  Past Medical History:  Diagnosis Date  . Allergy   . Anemia   . Anxiety   . Blood transfusion without reported diagnosis    Dec 2018  . Cataract    right eye  . Depression   . Diabetes type 1, uncontrolled ([place]) 11/14/2011   Since age 1  . Fibromyalgia   . Gastroparesis   . GERD (gastroesophageal reflux disease)   . Hypertension   . Infection    UTI April 2016    MEDICATIONS:  Prior to Admission medications   Medication Sig Start Date End Date Taking? Authorizing Provider  benzonatate (TESSALON PERLES) 100 MG capsule Take 1 capsule (100 mg total) by mouth 3 (three) times daily as needed. 08/13/18   Hassell Done Mary-Margaret, FNP  Continuous Blood Gluc Sensor (FREESTYLE LIBRE 14 DAY SENSOR) MISC 1 Device by Does not apply route every 14 (fourteen) days. 06/22/18   [name], MD  cyclobenzaprine (FLEXERIL) 10 MG tablet Take 1 tablet (10 mg total) by mouth 3 (three) times daily as needed for muscle spasms. 08/06/18   [name], MD  diclofenac  (VOLTAREN) 75 MG EC tablet Take 1 tablet (75 mg total) by mouth 2 (two) times daily. 08/06/18   [name], MD  dicyclomine (BENTYL) 20 MG tablet Take 1 tablet (20 mg total) by mouth 3 (three) times daily as needed for spasms (abdominal cramping). 06/03/18   Long, Wonda Olds, MD  feeding supplement, ENSURE ENLIVE, (ENSURE ENLIVE) LIQD Take 237 mLs by mouth 2 (two) times daily between meals. 05/18/18   Georgette Shell, MD  furosemide (LASIX) 40 MG tablet Take 1 tablet (40 mg total) by mouth daily. 07/20/18   Armbruster, Carlota Raspberry, MD  hyoscyamine (LEVSIN SL) 0.125 MG SL tablet Place 1 tablet (0.125 mg total) under the tongue every 6 (six) hours as needed for cramping (abdominal pain). 02/20/18   Shelly Coss, MD  insulin lispro (HUMALOG) 100 UNIT/ML injection Inject 0.04 mLs (4 Units total) into the skin 3 (three) times daily with meals. 06/18/18   Renato Shin, MD  LANTUS 100 UNIT/ML injection Inject 0.05 mLs (5 Units total) into the skin at bedtime. Patient using Vials 06/18/18   Renato Shin, MD  levofloxacin (LEVAQUIN) 750 MG tablet Take 750 mg by mouth daily. 08/16/18   [provider]  lidocaine (LIDODERM) 5 % Place 2 patches onto the skin daily. Remove & Discard patch within 12 hours or as directed by MD 08/03/18   Debbe Odea, MD  LORazepam (ATIVAN) 0.5 MG tablet TAKE 1  TABLET BY MOUTH  TWICE A DAY AS NEEDED FOR  ANXIETY 08/19/18   [name], MD  losartan (COZAAR) 25 MG tablet Take 1 tablet (25 mg total) by mouth daily. 06/22/18   [name], MD  metoCLOPramide (REGLAN) 10 MG tablet Take 1 tablet (10 mg total) by mouth every 6 (six) hours as needed for nausea or vomiting. 02/20/18   Shelly Coss, MD  mirtazapine (REMERON) 30 MG tablet Take 1 tablet (30 mg total) by mouth at bedtime. 05/31/18   [name], MD  ondansetron (ZOFRAN) 4 MG tablet Take 1 tablet (4 mg total) by mouth every 8 (eight) hours as needed for nausea or vomiting (If necessary, dont combine with  Reglan.). 03/25/18   Amin, Jeanella Flattery, MD  pantoprazole (PROTONIX) 40 MG tablet Take 1 tablet (40 mg total) by mouth 2 (two) times daily. 06/10/18   Raiford Noble Latif, DO  promethazine (PHENERGAN) 12.5 MG suppository Place 1 suppository (12.5 mg total) rectally every 8 (eight) hours as needed for nausea or vomiting. Note: Do not take while taking oral phenergan 07/27/18   Armbruster, Carlota Raspberry, MD  promethazine (PHENERGAN) 12.5 MG tablet TAKE 1 TABLET BY MOUTH  EVERY 8 HOURS AS NEEDED FOR NAUSEA AND VOMITING 08/11/18   Armbruster, Carlota Raspberry, MD    ALLERGIES:  Allergies  Allergen Reactions  . Other Anaphylaxis    Reaction to [place] nuts   . Lactose Intolerance (Gi) Diarrhea    SOCIAL HISTORY:  Social History   Tobacco Use  . Smoking status: Never Smoker  . Smokeless tobacco: Never Used  Substance Use Topics  . Alcohol use: No    Alcohol/week: 0.0 standard drinks    Frequency: Never    FAMILY HISTORY: Family History  Problem Relation Age of Onset  . Diabetes Mother   . Hypertension Father   . Colon cancer Paternal Grandmother        pt thinks PGM was dx in her 92's  . Diabetes Paternal Grandmother   . Diabetes Maternal Grandmother   . Diabetes Maternal Grandfather   . Diabetes Paternal Grandfather   . Diabetes Other   . Esophageal cancer Neg Hx   . Liver cancer Neg Hx   . Pancreatic cancer Neg Hx   . Stomach cancer Neg Hx   . Rectal cancer Neg Hx     EXAM: BP 100/75 (BP Location: Right Arm)   Pulse (!) 111   Temp 97.9 F (36.6 C) (Oral)   Resp 16   LMP 08/19/2018 Comment: pt shielded  SpO2 98%  CONSTITUTIONAL: Alert and oriented and responds appropriately to questions.  Patient appears older than stated age and appears chronically ill, she is thin and appears malnourished, actively vomiting HEAD: Normocephalic EYES: Conjunctivae clear, pupils appear equal, EOMI ENT: normal nose; patient has dry mucous membranes NECK: Supple, no meningismus, no nuchal rigidity,  no LAD  CARD: Regular and tachycardic; S1 and S2 appreciated; no murmurs, no clicks, no rubs, no gallops RESP: Patient appears short of breath talking to me but she has not hypoxic.  She is tachypneic with increased work of breathing.  There are no rhonchi, wheezing or rales appreciated. ABD/GI: Normal bowel sounds; non-distended; soft, non-tender when patient is distracted, no rebound, no guarding, no peritoneal signs, no hepatosplenomegaly BACK:  The back appears normal with normal range of motion EXT: Normal ROM in all joints; no edema, no bony deformity SKIN: Normal color for age and race; warm; no rash on exposed skin  NEURO: Moves all extremities equally PSYCH: Appears anxious  MEDICAL DECISION MAKING: Patient here with chest pain, shortness of breath, fevers, vomiting, diarrhea.  Patient is here frequently for intractable vomiting secondary to her gastroparesis.  She does not appear to be in DKA today.  She is tachycardic, tachypneic with some increased work of breathing without hypoxia.  Her chest x-ray shows persistent bilateral pneumonia.  She reports she is unable to keep her medications down at home.  She has Zofran, Phenergan and Reglan and has been on Levaquin for her pneumonia.  She is extremely ill-appearing here which is likely chronic in nature.  She appears thin, malnourished and dehydrated.  Blood pressures are slightly soft here.  I will give IV fluids and IV Levaquin.  Labs reassuring.  Urine shows no sign of infection.  Will obtain lactate, blood cultures.  Will treat symptoms with morphine, Zofran and Phenergan.  I feel she will need admission.  Will give dose of IV Levaquin here in the emergency department to cover for HCAP given recent admission less than 30 days ago.  ED PROGRESS:   5:09 AM Discussed patient's case with hospitalist, Dr. Myna Hidalgo.  I have recommended admission and patient (and family if present) agree with this plan. Admitting physician will place admission orders.    I reviewed all nursing notes, vitals, pertinent previous records, EKGs, lab and urine results, imaging (as available).    Hospitalist changed IV antibiotics from Levaquin to IV cefepime and IV vancomycin.   EKG Interpretation  Date/Time:  Friday Aug 20 2018 02:40:10 EDT Ventricular Rate:  118 PR Interval:  134 QRS Duration: 82 QT Interval:  322 QTC Calculation: 451 R Axis:   67 Text Interpretation:  Sinus tachycardia Otherwise normal ECG No significant change since last tracing other than rate is faster Confirmed by Devone Bonilla, Cyril Mourning [phone]) on 08/20/2018 3:01:09 AM        CRITICAL CARE Performed by: Cyril Mourning Miriam Kestler   Total critical care time: 45 minutes  Critical care time was exclusive of separately billable procedures and treating other patients.  Critical care was necessary to treat or prevent imminent or life-threatening deterioration.  Critical care was time spent personally by me on the following activities: development of treatment plan with patient and/or surrogate as well as nursing, discussions with consultants, evaluation of patient's response to treatment, examination of patient, obtaining history from patient or surrogate, ordering and performing treatments and interventions, ordering and review of laboratory studies, ordering and review of radiographic studies, pulse oximetry and re-evaluation of patient's condition.        Jaquana Geiger, Delice Bison, DO 08/20/18 Delrae Rend

## 2018-08-20 NOTE — Progress Notes (Addendum)
PROGRESS NOTE  CASSUNDRA MCKEEVER XBW:[id number] DOB: Aug 30, 1990 DOA: 08/20/2018 PCP: [name], MD   LOS: 0 days   Brief narrative: [name] is a 28 y.o. female with medical history significant for type 1 diabetes mellitus with diabetic gastroparesis, hypertension, chronic pain, and depression with anxiety,  presented to the emergency department for evaluation of shortness of breath with cough and fevers despite outpatient antibiotics.  She reported no improvement at home despite taking Levaquin for 3 or 4 days.  She initially denied any abdominal pain in the ED, but reported that she had abdominal pain and nausea consistent with her frequent flares and gastroparesis. She was tearful, clutching an emesis bag, and retching.  There is no lower extremity swelling or tenderness and no history of DVT or PE.  Patient asked for Ativan and Dilaudid, stating that that is what they usually give her in the hospital and the only thing that helps.   Upon arrival to the ED, patient is found to be afebrile, saturating well on room air, tachycardic in 110s, and with SBP 100.  EKG features sinus tachycardia with rate 118 and chest x-ray is notable for confluent right middle lobe and lingular opacities concerning for bilateral pneumonia.  Chemistry panel features a creatinine 1.05 and albumin of 2.5.  CBC is notable for a normocytic anemia with hemoglobin of 9.9, down from 10.8 earlier this month.  Blood cultures were collected in the ED, 2 L normal saline was administered, and the patient was given a dose of IV Levaquin. Hospitalists asked to admit for pneumonia without improvement on outpatient antibiotics.   Subjective: Patient complains of generalized body pain anxiety and restlessness with poor oral intolerance and multiple episodes of vomiting.  Assessment/Plan:  Principal Problem:   Pneumonia Active Problems:   Chronic abdominal pain   Hypertension associated with diabetes ([place])   Gastroparesis  Diabetes mellitus type 1 (HCC)   MDD (major depressive disorder), recurrent episode, mild (HCC)   Normocytic anemia   Multifocal pneumonia presenting with shortness of breath, cough, fever despite oral Levaquin at home.  Patient did have a negative COVID previously which was repeated in the ED which was negative again.  Will follow blood cultures.  Patient is on treatment for HCAP at this time.  Urinary Streptococcus antigen was negative.  Procalcitonin less than 0.10.  Lactate was 1.5.  Nausea, vomiting and abdominal pain secondary to diabetic gastroparesis.  Possible mild flare.  Continue on IV fluid hydration.  Antiemetics IV.  Liquid diet as tolerated.  Last A1c of 10 in March 2020.  On Lantus and Humalog at home.  Patient is supposed to follow-up at [facility] for gastric pacer.  We will continue with sliding scale insulin and Lantus while in hospital.   Depression with anxiety - Continue Remeron and as-needed Ativan .  We will change the Ativan to IV at this time.   Anemia  Likely anemia of chronic disease.  No evidence of active bleeding.    Acute on chronic pain-generalized body pain, chest pain back pain - Continue home regimen with lidocaine patch and as-needed Flexeril .  Patient appears to be in severe pain and due to lack of oral intolerance, will consider IV narcotics for now.  Will try to de-escalate as possible.   Essential hypertension  Blood pressure is accelerated at this time likely secondary to anxiety generalized pain.  Resume antihypertensive medication.  Closely monitor.   VTE Prophylaxis:  Lovenox  Code Status: Full  code  Family Communication: None  Disposition Plan: Home likely in 1 to 2 days, continue IV fluid hydration IV antiemetics and analgesia.  Continue IV antibiotics.  Follow cultures.  We will change the patient's status to inpatient at this time due to need for IV fluids, IV hydration, IV antiemetics IV narcotics and the need for IV  antibiotics..   Consultants:  None  Procedures:  None  Antibiotics: Anti-infectives (From admission, onward)   Start     Dose/Rate Route Frequency Ordered Stop   08/20/18 1100  ceFEPIme (MAXIPIME) 2 g in sodium chloride 0.9 % 100 mL IVPB     2 g 200 mL/hr over 30 Minutes Intravenous Every 8 hours 08/20/18 1032 08/28/18 0159   08/20/18 0800  vancomycin (VANCOCIN) IVPB 1000 mg/200 mL premix     1,000 mg 200 mL/hr over 60 Minutes Intravenous 2 times daily 08/20/18 0634     08/20/18 0600  ceFEPIme (MAXIPIME) 1 g in sodium chloride 0.9 % 100 mL IVPB  Status:  Discontinued     1 g 200 mL/hr over 30 Minutes Intravenous Every 8 hours 08/20/18 0514 08/20/18 1032   08/20/18 0500  levofloxacin (LEVAQUIN) IVPB 750 mg  Status:  Discontinued     750 mg 100 mL/hr over 90 Minutes Intravenous  Once 08/20/18 0447 08/20/18 0514      Objective: Vitals:   08/20/18 1148 08/20/18 1149  BP: (!) 177/118 (!) 191/126  Pulse: (!) 115 (!) 114  Resp: 16   Temp: 98 F (36.7 C)   SpO2: 100% 100%    Intake/Output Summary (Last 24 hours) at 08/20/2018 1342 Last data filed at 08/20/2018 1258 Gross per 24 hour  Intake 0 ml  Output 500 ml  Net -500 ml   There were no vitals filed for this visit. Body mass index is 21.72 kg/m.   Physical Exam: GENERAL: Patient is alert awake and oriented.  Appears to be in moderate distress due to pain.  Chronically ill, anxious writhing in pain. HENT: No scleral pallor or icterus. Pupils equally reactive to light. Oral mucosa is dry NECK: is supple, no palpable thyroid enlargement. CHEST: Clear to auscultation. No crackles or wheezes. Non tender on palpation. Diminished breath sounds bilaterally. CVS: S1 and S2 heard, no murmur.  Tachycardia.  No pericardial rub. ABDOMEN: Soft, nonspecific tenderness noted.  Bowel sounds are present. No palpable hepato-splenomegaly. EXTREMITIES: Bilateral lower extremity trace edema CNS: Cranial nerves are intact. No focal motor  or sensory deficits. SKIN: warm and dry without rashes.  Data Review: I have personally reviewed the following laboratory data and studies,  CBC: Recent Labs  Lab 08/20/18 0250  WBC 9.6  HGB 9.9*  HCT 30.0*  MCV 84.3  PLT 196   Basic Metabolic Panel: Recent Labs  Lab 08/20/18 0250  NA 138  K 3.7  CL 94*  CO2 31  GLUCOSE 121*  BUN 6  CREATININE 1.05*  CALCIUM 9.1   Liver Function Tests: Recent Labs  Lab 08/20/18 0250  AST 10*  ALT 8  ALKPHOS 108  BILITOT 0.6  PROT 7.9  ALBUMIN 2.5*   Recent Labs  Lab 08/20/18 0250  LIPASE 20   No results for input(s): AMMONIA in the last 168 hours. Cardiac Enzymes: No results for input(s): CKTOTAL, CKMB, CKMBINDEX, TROPONINI in the last 168 hours. BNP (last 3 results) No results for input(s): BNP in the last 8760 hours.  ProBNP (last 3 results) No results for input(s): PROBNP in the last 8760 hours.  CBG: Recent Labs  Lab 08/20/18 0847 08/20/18 1151  GLUCAP 208* 230*   Recent Results (from the past 240 hour(s))  SARS Coronavirus 2 (CEPHEID- Performed in [facility] hospital lab), Hosp Order     Status: None   Collection Time: 08/20/18  5:29 AM  Result Value Ref Range Status   SARS Coronavirus 2 NEGATIVE NEGATIVE Final    Comment: (NOTE) If result is NEGATIVE SARS-CoV-2 target nucleic acids are NOT DETECTED. The SARS-CoV-2 RNA is generally detectable in upper and lower  respiratory specimens during the acute phase of infection. The lowest  concentration of SARS-CoV-2 viral copies this assay can detect is 250  copies / mL. A negative result does not preclude SARS-CoV-2 infection  and should not be used as the sole basis for treatment or other  patient management decisions.  A negative result may occur with  improper specimen collection / handling, submission of specimen other  than nasopharyngeal swab, presence of viral mutation(s) within the  areas targeted by this assay, and inadequate number of viral copies    (<250 copies / mL). A negative result must be combined with clinical  observations, patient history, and epidemiological information. If result is POSITIVE SARS-CoV-2 target nucleic acids are DETECTED. The SARS-CoV-2 RNA is generally detectable in upper and lower  respiratory specimens dur ing the acute phase of infection.  Positive  results are indicative of active infection with SARS-CoV-2.  Clinical  correlation with patient history and other diagnostic information is  necessary to determine patient infection status.  Positive results do  not rule out bacterial infection or co-infection with other viruses. If result is PRESUMPTIVE POSTIVE SARS-CoV-2 nucleic acids MAY BE PRESENT.   A presumptive positive result was obtained on the submitted specimen  and confirmed on repeat testing.  While 2019 novel coronavirus  (SARS-CoV-2) nucleic acids may be present in the submitted sample  additional confirmatory testing may be necessary for epidemiological  and / or clinical management purposes  to differentiate between  SARS-CoV-2 and other Sarbecovirus currently known to infect humans.  If clinically indicated additional testing with an alternate test  methodology [phone]) is advised. The SARS-CoV-2 RNA is generally  detectable in upper and lower respiratory sp ecimens during the acute  phase of infection. The expected result is Negative. Fact Sheet for Patients:  StrictlyIdeas.no Fact Sheet for Healthcare Providers: BankingDealers.co.za This test is not yet approved or cleared by the [place] FDA and has been authorized for detection and/or diagnosis of SARS-CoV-2 by FDA under an Emergency Use Authorization (EUA).  This EUA will remain in effect (meaning this test can be used) for the duration of the COVID-19 declaration under Section 564(b)(1) of the Act, 21 U.S.C. section 360bbb-3(b)(1), unless the authorization is terminated  or revoked sooner. Performed at [facility], [place] [address] [place], [place] [address]      Studies: Dg Chest 2 View  Result Date: 08/20/2018 CLINICAL DATA:  28 year old female recently diagnosed with pneumonia. Shortness of breath and fever of 102.7. Reportedly COVID-19 negative. EXAM: CHEST - 2 VIEW COMPARISON:  07/29/2018 and earlier. FINDINGS: Confluent airspace opacity in the right middle lobe and lingula on both views. Elsewhere the lungs appear clear. No pleural effusion. Stable lung volumes. Normal cardiac size and mediastinal contours. Visualized tracheal air column is within normal limits. No acute osseous abnormality identified. Negative visible bowel gas pattern. Stable cholecystectomy clips. IMPRESSION: Confluent right middle lobe and lingula opacity compatible with bilateral pneumonia. No pleural effusion. Electronically  Signed   By: Genevie Ann M.D.   On: 08/20/2018 03:27    Scheduled Meds:  enoxaparin (LOVENOX) injection  40 mg Subcutaneous Q24H   feeding supplement (ENSURE ENLIVE)  237 mL Oral BID BM   insulin aspart  0-5 Units Subcutaneous QHS   insulin aspart  0-9 Units Subcutaneous TID WC   insulin aspart  2 Units Subcutaneous TID WC   insulin glargine  5 Units Subcutaneous QHS   metoCLOPramide (REGLAN) injection  10 mg Intravenous Q8H   mirtazapine  30 mg Oral QHS   pantoprazole  40 mg Oral BID   sodium chloride flush  3 mL Intravenous Once   sodium chloride flush  3 mL Intravenous Q12H    Continuous Infusions:  sodium chloride     ceFEPime (MAXIPIME) IV 2 g (08/20/18 1154)   vancomycin 1,000 mg (08/20/18 1005)     Flora Lipps, MD  [facility] 08/20/2018

## 2018-08-20 NOTE — ED Notes (Signed)
ED Provider at bedside. 

## 2018-08-20 NOTE — Progress Notes (Signed)
Pharmacy Antibiotic Note  [name] is a 28 y.o. female admitted on 08/20/2018 with SOB/chest pain/HCAP.  Pharmacy has been consulted for Vancomycin  dosing.  Plan: Vancomycin 1 g IV q12h     Temp (24hrs), Avg:97.9 F (36.6 C), Min:97.9 F (36.6 C), Max:97.9 F (36.6 C)  Recent Labs  Lab 08/20/18 0250  WBC 9.6  CREATININE 1.05*    CrCl cannot be calculated (Unknown ideal weight.).    Allergies  Allergen Reactions  . Other Anaphylaxis    Reaction to [place] nuts   . Lactose Intolerance (Gi) Diarrhea    Eddie Candle 08/20/2018 5:18 AM

## 2018-08-20 NOTE — Progress Notes (Signed)
Inpatient Diabetes Program Recommendations  AACE/ADA: New Consensus Statement on Inpatient Glycemic Control (2015)  Target Ranges:  Prepandial:   less than 140 mg/dL      Peak postprandial:   less than 180 mg/dL (1-2 hours)      Critically ill patients:  140 - 180 mg/dL   Lab Results  Component Value Date   GLUCAP 230 (H) 08/20/2018   HGBA1C 10.0 (H) 06/09/2018    Review of Glycemic Control Results for [name], [name] (MRN [id number]) as of 08/20/2018 13:28  Ref. Range 08/02/2018 20:54 08/03/2018 08:14 08/03/2018 11:50 08/20/2018 08:47 08/20/2018 11:51  Glucose-Capillary Latest Ref Range: 70 - 99 mg/dL 448 (H) 185 (H) 631 (H) 208 (H) 230 (H)   Diabetes history: DM1 Outpatient Diabetes medications: Lantus 8 units + Humalog 0-10 units tid meal coverage Current orders for Inpatient glycemic control: Lantus 3 units q hs + Novolog 2 units tid meal coverage + Novolog sensitive correction tid + hs  Inpatient Diabetes Program Recommendations:   Noted patient last saw Dr. Everardo All (endocrinologist) on 06/18/18 and spoke with DM Coordinator on 07/30/18. Attempted to call pt but unable to speak to pt.  @ this time. Please consider: -Increase Lantus to 5 units daily  Thank you, Billy Fischer. Clavin Ruhlman, RN, MSN, CDE  Diabetes Coordinator Inpatient Glycemic Control Team Team Pager [phone] (8am-5pm) 08/20/2018 1:33 PM

## 2018-08-20 NOTE — H&P (Addendum)
History and Physical    [name] ZPH:[id number] DOB: November 27, 1990 DOA: 08/20/2018  PCP: [name], MD   Patient coming from: Home   Chief Complaint: SOB, cough, fevers    HPI: [name] is a 28 y.o. female with medical history significant for type 1 diabetes mellitus with diabetic gastroparesis, hypertension, chronic pain, and depression with anxiety, now presenting to the emergency department for evaluation of shortness of breath with cough and fevers despite outpatient antibiotics.  She reports no improvement at home despite taking Levaquin for 3 or 4 days now.  She initially denied any abdominal pain in the ED, but now reports that she is developing abdominal pain and nausea consistent with her frequent flares and gastroparesis. She is tearful, clutching an emesis bag, and retching.  There is no lower extremity swelling or tenderness and no history of DVT or PE.  Patient asked for Ativan and Dilaudid, stating that that is what they usually give her in the hospital and the only thing that helps.   ED Course: Upon arrival to the ED, patient is found to be afebrile, saturating well on room air, tachycardic in 110s, and with SBP 100.  EKG features sinus tachycardia with rate 118 and chest x-ray is notable for confluent right middle lobe and lingular opacities concerning for bilateral pneumonia.  Chemistry panel features a creatinine 1.05 and albumin of 2.5.  CBC is notable for a normocytic anemia with hemoglobin of 9.9, down from 10.8 earlier this month.  Blood cultures were collected in the ED, 2 L normal saline was administered, and the patient was given a dose of IV Levaquin. Hospitalists asked to admit for pneumonia without improvement on outpatient antibiotics.   Review of Systems:  All other systems reviewed and apart from HPI, are negative.  Past Medical History:  Diagnosis Date  . Allergy   . Anemia   . Anxiety   . Blood transfusion without reported diagnosis    Dec 2018   . Cataract    right eye  . Depression   . Diabetes type 1, uncontrolled ([place]) 11/14/2011   Since age 24  . Fibromyalgia   . Gastroparesis   . GERD (gastroesophageal reflux disease)   . Hypertension   . Infection    UTI April 2016    Past Surgical History:  Procedure Laterality Date  . ANKLE SURGERY    . CHOLECYSTECTOMY  11/15/2011   Procedure: LAPAROSCOPIC CHOLECYSTECTOMY WITH INTRAOPERATIVE CHOLANGIOGRAM;  Surgeon: [name], MD;  Location: [facility];  Service: General;  Laterality: N/A;  . COLONOSCOPY    . COLONOSCOPY WITH PROPOFOL N/A 06/27/2017   Procedure: COLONOSCOPY WITH PROPOFOL;  Surgeon: Milus Banister, MD;  Location: [facility];  Service: Endoscopy;  Laterality: N/A;  . ESOPHAGOGASTRODUODENOSCOPY  12/03/2011   Procedure: ESOPHAGOGASTRODUODENOSCOPY (EGD);  Surgeon: Beryle Beams, MD;  Location: [facility];  Service: Endoscopy;  Laterality: N/A;  . FLEXIBLE SIGMOIDOSCOPY N/A 03/10/2017   Procedure: FLEXIBLE SIGMOIDOSCOPY;  Surgeon: Carol Ada, MD;  Location: [facility];  Service: Endoscopy;  Laterality: N/A;  . INCISION AND DRAINAGE PERIRECTAL ABSCESS N/A 03/01/2017   Procedure: IRRIGATION AND DEBRIDEMENT PERIRECTAL ABSCESS;  Surgeon: Alphonsa Overall, MD;  Location: [facility];  Service: General;  Laterality: N/A;  . IRRIGATION AND DEBRIDEMENT BUTTOCKS N/A 03/23/2017   Procedure: IRRIGATION AND DEBRIDEMENT BUTTOCKS, SETON PLACEMENT;  Surgeon: [name], MD;  Location: [facility];  Service: General;  Laterality: N/A;  . LAPAROSCOPY  11/23/2011   Procedure: LAPAROSCOPY DIAGNOSTIC;  Surgeon: Edward Jolly, MD;  Location: [facility];  Service: General;  Laterality: N/A;  . SIGMOIDOSCOPY    . UPPER GASTROINTESTINAL ENDOSCOPY    . WISDOM TOOTH EXTRACTION       reports that she has never smoked. She has never used smokeless tobacco. She reports that she does not drink alcohol or use drugs.  Allergies  Allergen Reactions  . Other Anaphylaxis    Reaction to [place] nuts    . Lactose Intolerance (Gi) Diarrhea    Family History  Problem Relation Age of Onset  . Diabetes Mother   . Hypertension Father   . Colon cancer Paternal Grandmother        pt thinks PGM was dx in her 98's  . Diabetes Paternal Grandmother   . Diabetes Maternal Grandmother   . Diabetes Maternal Grandfather   . Diabetes Paternal Grandfather   . Diabetes Other   . Esophageal cancer Neg Hx   . Liver cancer Neg Hx   . Pancreatic cancer Neg Hx   . Stomach cancer Neg Hx   . Rectal cancer Neg Hx      Prior to Admission medications   Medication Sig Start Date End Date Taking? Authorizing Provider  benzonatate (TESSALON PERLES) 100 MG capsule Take 1 capsule (100 mg total) by mouth 3 (three) times daily as needed. 08/13/18   Hassell Done Mary-Margaret, FNP  Continuous Blood Gluc Sensor (FREESTYLE LIBRE 14 DAY SENSOR) MISC 1 Device by Does not apply route every 14 (fourteen) days. 06/22/18   [name], MD  cyclobenzaprine (FLEXERIL) 10 MG tablet Take 1 tablet (10 mg total) by mouth 3 (three) times daily as needed for muscle spasms. 08/06/18   [name], MD  diclofenac (VOLTAREN) 75 MG EC tablet Take 1 tablet (75 mg total) by mouth 2 (two) times daily. 08/06/18   [name], MD  dicyclomine (BENTYL) 20 MG tablet Take 1 tablet (20 mg total) by mouth 3 (three) times daily as needed for spasms (abdominal cramping). 06/03/18   Long, Wonda Olds, MD  feeding supplement, ENSURE ENLIVE, (ENSURE ENLIVE) LIQD Take 237 mLs by mouth 2 (two) times daily between meals. 05/18/18   Georgette Shell, MD  furosemide (LASIX) 40 MG tablet Take 1 tablet (40 mg total) by mouth daily. 07/20/18   Armbruster, Carlota Raspberry, MD  hyoscyamine (LEVSIN SL) 0.125 MG SL tablet Place 1 tablet (0.125 mg total) under the tongue every 6 (six) hours as needed for cramping (abdominal pain). 02/20/18   Shelly Coss, MD  insulin lispro (HUMALOG) 100 UNIT/ML injection Inject 0.04 mLs (4 Units total) into the skin 3 (three) times  daily with meals. 06/18/18   Renato Shin, MD  LANTUS 100 UNIT/ML injection Inject 0.05 mLs (5 Units total) into the skin at bedtime. Patient using Vials 06/18/18   Renato Shin, MD  levofloxacin (LEVAQUIN) 750 MG tablet Take 750 mg by mouth daily. 08/16/18   [provider]  lidocaine (LIDODERM) 5 % Place 2 patches onto the skin daily. Remove & Discard patch within 12 hours or as directed by MD 08/03/18   Debbe Odea, MD  LORazepam (ATIVAN) 0.5 MG tablet TAKE 1 TABLET BY MOUTH  TWICE A DAY AS NEEDED FOR  ANXIETY 08/19/18   [name], MD  losartan (COZAAR) 25 MG tablet Take 1 tablet (25 mg total) by mouth daily. 06/22/18   [name], MD  metoCLOPramide (REGLAN) 10 MG tablet Take 1 tablet (10 mg total) by mouth every  6 (six) hours as needed for nausea or vomiting. 02/20/18   Shelly Coss, MD  mirtazapine (REMERON) 30 MG tablet Take 1 tablet (30 mg total) by mouth at bedtime. 05/31/18   [name], MD  ondansetron (ZOFRAN) 4 MG tablet Take 1 tablet (4 mg total) by mouth every 8 (eight) hours as needed for nausea or vomiting (If necessary, dont combine with Reglan.). 03/25/18   Amin, Jeanella Flattery, MD  pantoprazole (PROTONIX) 40 MG tablet Take 1 tablet (40 mg total) by mouth 2 (two) times daily. 06/10/18   Raiford Noble Latif, DO  promethazine (PHENERGAN) 12.5 MG suppository Place 1 suppository (12.5 mg total) rectally every 8 (eight) hours as needed for nausea or vomiting. Note: Do not take while taking oral phenergan 07/27/18   Armbruster, Carlota Raspberry, MD  promethazine (PHENERGAN) 12.5 MG tablet TAKE 1 TABLET BY MOUTH  EVERY 8 HOURS AS NEEDED FOR NAUSEA AND VOMITING 08/11/18   Armbruster, Carlota Raspberry, MD    Physical Exam: Vitals:   08/20/18 0234 08/20/18 0404  BP: 100/75   Pulse: (!) 111 (!) 107  Resp: 16   Temp: 97.9 F (36.6 C)   TempSrc: Oral   SpO2: 98% 100%    Constitutional: NAD, appears chronically-ill  Eyes: PERTLA, lids and conjunctivae normal ENMT: Mucous membranes  are moist. Posterior pharynx clear of any exudate or lesions.   Neck: normal, supple, no masses, no thyromegaly Respiratory: clear to auscultation bilaterally, no wheezing, no crackles. No accessory muscle use.  Cardiovascular: Rate ~110 and regular. Trace pedal edema bialterally. Abdomen: No distension, soft, no guarding. Bowel sounds active.  Musculoskeletal: no clubbing / cyanosis. No joint deformity upper and lower extremities.   Skin: no significant rashes, lesions, ulcers. Warm, dry, well-perfused. Neurologic: No gross facial asymmetry. Moving all extremities.   Psychiatric: Alert and oriented to person, place, and situation. Tearful. Cooperative.    Labs on Admission: I have personally reviewed following labs and imaging studies  CBC: Recent Labs  Lab 08/20/18 0250  WBC 9.6  HGB 9.9*  HCT 30.0*  MCV 84.3  PLT 810   Basic Metabolic Panel: Recent Labs  Lab 08/20/18 0250  NA 138  K 3.7  CL 94*  CO2 31  GLUCOSE 121*  BUN 6  CREATININE 1.05*  CALCIUM 9.1   GFR: CrCl cannot be calculated (Unknown ideal weight.). Liver Function Tests: Recent Labs  Lab 08/20/18 0250  AST 10*  ALT 8  ALKPHOS 108  BILITOT 0.6  PROT 7.9  ALBUMIN 2.5*   Recent Labs  Lab 08/20/18 0250  LIPASE 20   No results for input(s): AMMONIA in the last 168 hours. Coagulation Profile: No results for input(s): INR, PROTIME in the last 168 hours. Cardiac Enzymes: No results for input(s): CKTOTAL, CKMB, CKMBINDEX, TROPONINI in the last 168 hours. BNP (last 3 results) No results for input(s): PROBNP in the last 8760 hours. HbA1C: No results for input(s): HGBA1C in the last 72 hours. CBG: No results for input(s): GLUCAP in the last 168 hours. Lipid Profile: No results for input(s): CHOL, HDL, LDLCALC, TRIG, CHOLHDL, LDLDIRECT in the last 72 hours. Thyroid Function Tests: No results for input(s): TSH, T4TOTAL, FREET4, T3FREE, THYROIDAB in the last 72 hours. Anemia Panel: No results for  input(s): VITAMINB12, FOLATE, FERRITIN, TIBC, IRON, RETICCTPCT in the last 72 hours. Urine analysis:    Component Value Date/Time   COLORURINE YELLOW 08/20/2018 [place] 08/20/2018 0317   LABSPEC 1.005 08/20/2018 0317   PHURINE 6.0  08/20/2018 0317   GLUCOSEU >=500 (A) 08/20/2018 0317   HGBUR MODERATE (A) 08/20/2018 0317   BILIRUBINUR NEGATIVE 08/20/2018 0317   BILIRUBINUR negative 01/02/2015 1717   [place] 08/20/2018 0317   PROTEINUR 100 (A) 08/20/2018 0317   UROBILINOGEN 0.2 01/13/2015 0223   NITRITE NEGATIVE 08/20/2018 0317   LEUKOCYTESUR NEGATIVE 08/20/2018 0317   Sepsis Labs: @LABRCNTIP (procalcitonin:4,lacticidven:4) )No results found for this or any previous visit (from the past 240 hour(s)).   Radiological Exams on Admission: Dg Chest 2 View  Result Date: 08/20/2018 CLINICAL DATA:  28 year old female recently diagnosed with pneumonia. Shortness of breath and fever of 102.7. Reportedly COVID-19 negative. EXAM: CHEST - 2 VIEW COMPARISON:  07/29/2018 and earlier. FINDINGS: Confluent airspace opacity in the right middle lobe and lingula on both views. Elsewhere the lungs appear clear. No pleural effusion. Stable lung volumes. Normal cardiac size and mediastinal contours. Visualized tracheal air column is within normal limits. No acute osseous abnormality identified. Negative visible bowel gas pattern. Stable cholecystectomy clips. IMPRESSION: Confluent right middle lobe and lingula opacity compatible with bilateral pneumonia. No pleural effusion. Electronically Signed   By: Genevie Ann M.D.   On: 08/20/2018 03:27    EKG: Independently reviewed. Sinus tachycardia (rate 118).   Assessment/Plan   1. Pneumonia  - Presents with a week of SOB, cough, and fevers, not improving despite a few days of Levaquin at home  - She had a negative COVID-19 test earlier in this illness (per pt report) and her workup is more consistent with a bacterial pneumonia; COVID-19 pcr  was sent from ED and currently pending but suspicion is low  - She is not hypoxic or febrile in ED but is dyspneic and tachycardic at rest - Blood cultures collected in ED and IV Levaquin given  - She developed sxs shortly after a 1-wk hospitalization and has not improved on Levaquin at home, so will continue antibiotics with vancomycin and cefepime, check sputum cultures and strep pneumo antigen, trend procalcitonin, follow cultures and clinical course    2. Type I DM; gastroparesis  - A1c was 10.0% in March  - Managed at home with Lantus and Humalog  - She is followed by GI at [facility] with plans for gastric pacer  - Manage with Lantus and Novolog while in hospital, will start with lower dose given poor appetite and increased creatinine   3. Depression with anxiety - Continue Remeron and as-needed Ativan    4. Anemia  - Hgb 9.9 on admission, similar to priors and without active bleeding   5. Chronic pain  - Continue home regimen with lidocaine patch and as-needed Flexeril    6. Hypertension  - BP low-normal in ED and creatinine is increased so losartan held initially    PPE: Mask, face shield  DVT prophylaxis: Lovenox Code Status: Full  Family Communication: Discussed with patient  Consults called: none Admission status: Observation     Vianne Bulls, MD [facility] Pager [phone]  If 7PM-7AM, please contact night-coverage [email or web address] Password [facility]  08/20/2018, 5:14 AM

## 2018-08-20 NOTE — Discharge Instructions (Signed)
Gastroparesis Nutrition Therapy  Gastroparesis means that your stomach empties very slowly. This happens when the nerves to your stomach are damaged or do not work properly. This can cause bloating, stomach discomfort or pain, feeling full after eating only a small amount of food, nausea, or vomiting. If you have diabetes in addition to gastroparesis, it is important to control your blood glucose. This will help the stomach empty.  Tips Following these tips may help your stomach empty faster:  Eat small, frequent meals (4 to 6 times per day).  Do not eat solid foods that are high in fat and do not add too much fat to foods. See the Foods Not Recommended table for foods that are high fat.  High-fat solid foods may delay the emptying of your stomach.  Liquids that contain fat, such as milkshakes, may be tolerated and can provide needed calories. Do not eat foods high in fiber. Do not take fiber supplements or fiber bulking agents for constipation.  Do not eat foods that increase acid reflux:  Acidic, spicy, fried and greasy foods  Caffeine  [place] Do not drink alcohol or smoke  Do not drink carbonated beverages, as they increase bloating.  Chew foods well before swallowing. Solid foods in the stomach do not empty well. If you have difficulty tolerating solid foods, ground foods may be better.  If symptoms are severe, semi-solid foods or liquids may need to be your main food sources. Choose liquid nutritional supplements that have less than or equal to 2 grams fiber per serving.  Sit upright while eating and sit upright or walk after meals. Do not lie down for 3 to 4 hours after eating to avoid reflux or regurgitation.  If you wish to nap during the day, nap first and then eat.  Drinking fluids at meals can take up room in your stomach, and you might not get enough calories. At every meal, first eat a grain food and a protein food or dairy product if your body can tolerate it. Drink fluids with  calories. It may be better to delay fluids until after the meal and drink more between meals.   Foods Recommended Food Group Foods Recommended   Grains Choose grain foods with less than 2 grams of fiber per serving; these will be made with white flour Crackers: saltines or graham crackers Cold cereal: puffed rice Cream of rice or wheat Grits (fine ground) Gluten free low fiber foods Pretzels White bread, toasted White rice, cook until very soft  Protein Foods Lean meat and poultry: well-cooked, very tender, moist, and chopped fine Fish: tuna, salmon, or white fish Egg whites, scrambled Peanut butter (limit to 1 tablespoon at a time)  Dairy Milk*, drink 2% if tolerated to get more nutrients or lactose-free 2% milk Fortified non-dairy milks: almond, cashew, coconut, or rice (be aware that these options are not good sources of protein so you will need to eat an additional protein food) Fortified pea milk or soymilk (may cause gas and bloating for some) Instant breakfast* (pre-made lactose-free is sold in bottles) Milkshakes* (try blending in  to  cup canned fruit) Ice cream* (low-fat may be tolerated better; use in milkshakes to increase calories) Frozen yogurt Yogurt* Puddings and custard* Sherbet Liquid nutritional supplements with less than or equal to 2 grams fiber per 1 cup serving *Use lactose-free varieties to reduce gas and bloating  Vegetables Canned and well-cooked vegetables without seeds, skins or hulls Carrots, cooked Mashed potatoes (white, red or yellow) Sweet   potato  Fruit Canned, soft and well-cooked fruits without seeds, skins or membranes Applesauce Banana, mashed may be tolerated better Diced peaches/pears fruit cups in juice Melon, very soft, cut into small pieces Fruit nectar juices  Oils When possible choose oils rather than solid fats Canola or olive oil Margarine  Other Clear soup Gelatin Popsicles   Foods Not Recommended Food Group Foods Not  Recommended   Grains Bran Grains foods with 2 or more grams of fiber per serving: barley, brown rice, kasha, quinoa Popcorn Whole grain and high-fiber cereals, including oats or granola Whole grain bread or pasta  Protein Foods Fried meats, poultry or fish Sausage, bacon or hot dogs Seafood Tough meat, meat with gristle: steak, roast beef or pork chops Beans, peas or lentils Nuts  Dairy Cheese slices Liquid nutritional supplements that have more than 2 grams fiber per serving Pea milk, soymilk (may increase gas and bloating)  Vegetables Raw or undercooked vegetables Alfalfa, asparagus, bean sprouts, broccoli, brussels sprouts, cabbage, cauliflower, corn, green peas or any other kind of peas, lima beans, mushrooms, okra, onions, parsnips, peppers, pickles, potato skins, or spinach  Fruit Fresh fruit except for the ones in the foods recommended table Acidic fruit and juices: oranges/orange juice, grapefruit/grapefruit juice, tomatoes/tomato juice Avocado Berries Coconut Dried fruit Fruit skin Mandarin oranges Pineapple  Oils Fried foods of any type  Other Coffee Olives or pickles Pizza Salsa Sushi   Gastroparesis Sample 1-Day Menu  Breakfast 1 slice white toast (1 carbohydrate serving)  1 teaspoon margarine, soft, tub   cup egg substitute  1 cup peach nectar (2 carbohydrate servings)  Morning Snack Smoothie made with:  small banana (1 carbohydrate serving)  1/3 cup Greek strawberry yogurt ( carbohydrate serving)  1 cup 2% milk (1 carbohydrate serving)  Lunch 2 ounces canned chicken  1 teaspoon mayonnaise  9 saltine crackers (1 carbohydrate servings)   cup applesauce (1 carbohydrate serving)  Afternoon Snack 1 slice white toast (1 carbohydrate serving)  1 tablespoon smooth peanut butter  Evening Meal 2 ounces baked fish   cup mashed potatoes (1 carbohydrate serving)  1 teaspoon olive oil  1 cup 2% milk (1 carbohydrate serving)  Evening Snack 1 packet instant  breakfast (1 carbohydrate servings)  1 cup 2% milk (1 carbohydrate serving)   Gastroparesis Vegetarian (Lacto-Ovo) Sample 1-Day Menu  Breakfast  cup cooked farina (1 carbohydrate serving)   cup egg substitute  2 teaspoons olive oil   cup peach nectar (2 carbohydrate servings)   cup 2% milk ( carbohydrate serving)  Morning Snack 1 slice white toast (1 carbohydrate serving)  1 tablespoon smooth peanut butter  Lunch  cup vegetable soup (1 carbohydrate serving)  9 saltine crackers (1 carbohydrate serving)   cup applesauce (1 carbohydrate serving)   cup 2% milk ( carbohydrate serving)  Afternoon Snack 6 ounces plain yogurt (1 carbohydrate serving)   small banana (1 carbohydrate servings)  Evening Meal  cup baked tofu  2/3 cup white rice (2 carbohydrate servings)  2 teaspoons olive oil   cup 2% milk ( carbohydrate serving)  Evening Snack 1 packet instant breakfast (1 carbohydrate servings)  1 cup 2% milk (1 carbohydrate serving)   Gastroparesis Vegan Sample 1-Day Menu  Breakfast  cup cooked farina (1 carbohydrate serving)  1/3 cup tofu scramble  2 teaspoons olive oil   cup peach nectar (2 carbohydrate servings)   cup almond milk fortified with calcium, vitamin B12, and vitamin D  Morning Snack 1 slice white toast (1   carbohydrate serving)  1 tablespoon smooth peanut butter  Lunch  cup vegetable soup (1 carbohydrate serving)  9 saltine crackers (1 carbohydrate serving)   cup applesauce (1 carbohydrate serving)  Afternoon Snack 6 ounces plain soy yogurt (1 carbohydrate servings)   small banana (1 carbohydrate serving)  Evening Meal  cup baked tofu  2/3 cup white rice (2 carbohydrate servings)  2 teaspoons olive oil   cup almond milk fortified with calcium, vitamin B12, and vitamin D  Evening Snack  scoop soy protein powder ( carbohydrate serving)   cup almond milk fortified with calcium, vitamin B12, and vitamin D   Copyright 2020  [employer]  [employer]. All rights reserved.  

## 2018-08-21 LAB — CBC WITH DIFFERENTIAL/PLATELET
Abs Immature Granulocytes: 0.05 10*3/uL (ref 0.00–0.07)
Basophils Absolute: 0 10*3/uL (ref 0.0–0.1)
Basophils Relative: 0 %
Eosinophils Absolute: 0.1 10*3/uL (ref 0.0–0.5)
Eosinophils Relative: 1 %
HCT: 25.3 % — ABNORMAL LOW (ref 36.0–46.0)
Hemoglobin: 8.4 g/dL — ABNORMAL LOW (ref 12.0–15.0)
Immature Granulocytes: 1 %
Lymphocytes Relative: 24 %
Lymphs Abs: 2 10*3/uL (ref 0.7–4.0)
MCH: 27.6 pg (ref 26.0–34.0)
MCHC: 33.2 g/dL (ref 30.0–36.0)
MCV: 83.2 fL (ref 80.0–100.0)
Monocytes Absolute: 0.7 10*3/uL (ref 0.1–1.0)
Monocytes Relative: 9 %
Neutro Abs: 5.3 10*3/uL (ref 1.7–7.7)
Neutrophils Relative %: 65 %
Platelets: 323 10*3/uL (ref 150–400)
RBC: 3.04 MIL/uL — ABNORMAL LOW (ref 3.87–5.11)
RDW: 12.1 % (ref 11.5–15.5)
WBC: 8.1 10*3/uL (ref 4.0–10.5)
nRBC: 0 % (ref 0.0–0.2)

## 2018-08-21 LAB — GLUCOSE, CAPILLARY
Glucose-Capillary: 199 mg/dL — ABNORMAL HIGH (ref 70–99)
Glucose-Capillary: 308 mg/dL — ABNORMAL HIGH (ref 70–99)
Glucose-Capillary: 102 mg/dL — ABNORMAL HIGH (ref 70–99)
Glucose-Capillary: 129 mg/dL — ABNORMAL HIGH (ref 70–99)

## 2018-08-21 LAB — BASIC METABOLIC PANEL
Anion gap: 12 (ref 5–15)
BUN: 16 mg/dL (ref 6–20)
CO2: 25 mmol/L (ref 22–32)
Calcium: 8.1 mg/dL — ABNORMAL LOW (ref 8.9–10.3)
Chloride: 96 mmol/L — ABNORMAL LOW (ref 98–111)
Creatinine, Ser: 0.88 mg/dL (ref 0.44–1.00)
GFR calc Af Amer: >60 mL/min (ref >60)
GFR calc non Af Amer: >60 mL/min (ref >60)
Glucose, Bld: 221 mg/dL — ABNORMAL HIGH (ref 70–99)
Potassium: 3.2 mmol/L — ABNORMAL LOW (ref 3.5–5.1)
Sodium: 133 mmol/L — ABNORMAL LOW (ref 135–145)

## 2018-08-21 MED ORDER — VANCOMYCIN HCL IN DEXTROSE 750-5 MG/150ML-% IV SOLN
750.0000 mg | Freq: Two times a day (BID) | INTRAVENOUS | Status: DC
  Administered 2018-08-21 – 2018-08-22 (×2): 750 mg via INTRAVENOUS
  Filled 2018-08-21 (×4): qty 150

## 2018-08-21 MED ORDER — POTASSIUM CHLORIDE CRYS ER 20 MEQ PO TBCR
40.0000 meq | EXTENDED_RELEASE_TABLET | Freq: Every day | ORAL | Status: DC
  Administered 2018-08-21 – 2018-08-22 (×2): 40 meq via ORAL
  Filled 2018-08-21 (×2): qty 2

## 2018-08-21 NOTE — Progress Notes (Addendum)
PROGRESS NOTE  EUGENE ZEIDERS ERD:[id number] DOB: Jan 25, 1991 DOA: 08/20/2018 PCP: [name], MD   LOS: 1 day   Brief narrative: [name] is a 28 y.o. female with medical history significant for type 1 diabetes mellitus with diabetic gastroparesis, hypertension, chronic pain, and depression with anxiety,  presented to the emergency department for evaluation of shortness of breath with cough and fevers despite outpatient antibiotics.  She reported no improvement at home despite taking Levaquin for 3 or 4 days.  She initially denied any abdominal pain in the ED, but reported that she had abdominal pain and nausea consistent with her frequent flares and gastroparesis. She was tearful, clutching an emesis bag, and retching.  There is no lower extremity swelling or tenderness and no history of DVT or PE.  Patient asked for Ativan and Dilaudid, stating that that is what they usually give her in the hospital and the only thing that helps.   Upon arrival to the ED, patient is found to be afebrile, saturating well on room air, tachycardic in 110s, and with SBP 100.  EKG features sinus tachycardia with rate 118 and chest x-ray is notable for confluent right middle lobe and lingular opacities concerning for bilateral pneumonia.  Chemistry panel features a creatinine 1.05 and albumin of 2.5.  CBC is notable for a normocytic anemia with hemoglobin of 9.9, down from 10.8 earlier this month.  Blood cultures were collected in the ED, 2 L normal saline was administered, and the patient was given a dose of IV Levaquin. Hospitalists asked to admit for pneumonia without improvement on outpatient antibiotics.   Subjective: Today patient complains of feeling better with her body pain.  Denies increasing shortness of breath chest pain or fever.  She is starting to be able to tolerate some oral intake..  Assessment/Plan:  Principal Problem:   Pneumonia Active Problems:   Chronic abdominal pain   Hypertension  associated with diabetes ([place])   Gastroparesis   Diabetes mellitus type 1 (HCC)   MDD (major depressive disorder), recurrent episode, mild (HCC)   Normocytic anemia   Multifocal pneumonia presenting with shortness of breath, cough, fever despite oral Levaquin at home.  Improving clinically.  Patient did have a negative COVID previously which was repeated in the ED which was negative again. Blood cultures pending.  Patient is on treatment for HCAP at this time.  Urinary Streptococcus antigen was negative.  Procalcitonin less than 0.10.  Lactate was 1.5.  Hypokalemia.  Will replenish.  Check levels in a.m.  Nausea, vomiting and abdominal pain secondary to diabetic gastroparesis.  Possible mild flare.  Improving.  Continue on IV fluid hydration.  Antiemetics IV.  Liquid diet as tolerated-advance as tolerated.  Last A1c of 10 in March 2020.  On Lantus and Humalog at home.  Patient is supposed to follow-up at [facility] for gastric pacer.  We will continue with sliding scale insulin and Lantus while in hospital.   Depression with anxiety - Continue Remeron and as-needed Ativan .  Received IV Ativan for extreme anxiety  Anemia  Likely anemia of chronic disease.  No evidence of active bleeding.    Acute on chronic pain-generalized body pain, chest pain back pain - Continue home regimen with lidocaine patch and as-needed Flexeril .  Patient was counseled regarding IV narcotics Will try to de-escalate as possible.   Essential hypertension  Better controlled today.   VTE Prophylaxis:  Lovenox  Code Status: Full code  Family Communication: None  Disposition Plan: Home likely tomorrow if she continues to improve.  Consultants:  None  Procedures:  None  Antibiotics: Anti-infectives (From admission, onward)   Start     Dose/Rate Route Frequency Ordered Stop   08/20/18 1100  ceFEPIme (MAXIPIME) 2 g in sodium chloride 0.9 % 100 mL IVPB     2 g 200 mL/hr over 30 Minutes  Intravenous Every 8 hours 08/20/18 1032 08/28/18 0159   08/20/18 0800  vancomycin (VANCOCIN) IVPB 1000 mg/200 mL premix     1,000 mg 200 mL/hr over 60 Minutes Intravenous 2 times daily 08/20/18 0634     08/20/18 0600  ceFEPIme (MAXIPIME) 1 g in sodium chloride 0.9 % 100 mL IVPB  Status:  Discontinued     1 g 200 mL/hr over 30 Minutes Intravenous Every 8 hours 08/20/18 0514 08/20/18 1032   08/20/18 0500  levofloxacin (LEVAQUIN) IVPB 750 mg  Status:  Discontinued     750 mg 100 mL/hr over 90 Minutes Intravenous  Once 08/20/18 0447 08/20/18 0514      Objective: Vitals:   08/21/18 0542 08/21/18 0933  BP: (!) 140/93 (!) 132/96  Pulse: (!) 114 (!) 109  Resp: 18 18  Temp: 98.1 F (36.7 C)   SpO2: 97% 97%    Intake/Output Summary (Last 24 hours) at 08/21/2018 1019 Last data filed at 08/21/2018 0933 Gross per 24 hour  Intake 240 ml  Output 700 ml  Net -460 ml   Filed Weights   08/21/18 0540  Weight: 57 kg   Body mass index is 19.67 kg/m.   Physical Exam: GENERAL: Patient is alert awake and oriented.  Not in obvious distress today.  Thinly built.  HENT: No scleral pallor or icterus. Pupils equally reactive to light. Oral mucosa is dry NECK: is supple, no palpable thyroid enlargement. CHEST: Clear to auscultation. No crackles or wheezes. Non tender on palpation. Diminished breath sounds bilaterally. CVS: S1 and S2 heard, no murmur.  Tachycardia.  No pericardial rub. ABDOMEN: Soft, nonspecific tenderness noted.  Bowel sounds are present. No palpable hepato-splenomegaly. EXTREMITIES: Bilateral lower extremity trace edema CNS: Cranial nerves are intact. No focal motor or sensory deficits. SKIN: warm and dry without rashes.  Data Review: I have personally reviewed the following laboratory data and studies,  CBC: Recent Labs  Lab 08/20/18 0250 08/21/18 0434  WBC 9.6 8.1  NEUTROABS  --  5.3  HGB 9.9* 8.4*  HCT 30.0* 25.3*  MCV 84.3 83.2  PLT 375 814   Basic Metabolic  Panel: Recent Labs  Lab 08/20/18 0250 08/21/18 0434  NA 138 133*  K 3.7 3.2*  CL 94* 96*  CO2 31 25  GLUCOSE 121* 221*  BUN 6 16  CREATININE 1.05* 0.88  CALCIUM 9.1 8.1*   Liver Function Tests: Recent Labs  Lab 08/20/18 0250  AST 10*  ALT 8  ALKPHOS 108  BILITOT 0.6  PROT 7.9  ALBUMIN 2.5*   Recent Labs  Lab 08/20/18 0250  LIPASE 20   No results for input(s): AMMONIA in the last 168 hours. Cardiac Enzymes: No results for input(s): CKTOTAL, CKMB, CKMBINDEX, TROPONINI in the last 168 hours. BNP (last 3 results) No results for input(s): BNP in the last 8760 hours.  ProBNP (last 3 results) No results for input(s): PROBNP in the last 8760 hours.  CBG: Recent Labs  Lab 08/20/18 0847 08/20/18 1151 08/20/18 1631 08/20/18 2114 08/21/18 0621  GLUCAP 208* 230* 189* 262* 199*   Recent Results (from the past 240 hour(s))  SARS Coronavirus 2 (CEPHEID- Performed in [place] hospital lab), Hosp Order     Status: None   Collection Time: 08/20/18  5:29 AM  Result Value Ref Range Status   SARS Coronavirus 2 NEGATIVE NEGATIVE Final    Comment: (NOTE) If result is NEGATIVE SARS-CoV-2 target nucleic acids are NOT DETECTED. The SARS-CoV-2 RNA is generally detectable in upper and lower  respiratory specimens during the acute phase of infection. The lowest  concentration of SARS-CoV-2 viral copies this assay can detect is 250  copies / mL. A negative result does not preclude SARS-CoV-2 infection  and should not be used as the sole basis for treatment or other  patient management decisions.  A negative result may occur with  improper specimen collection / handling, submission of specimen other  than nasopharyngeal swab, presence of viral mutation(s) within the  areas targeted by this assay, and inadequate number of viral copies  (<250 copies / mL). A negative result must be combined with clinical  observations, patient history, and epidemiological information. If result  is POSITIVE SARS-CoV-2 target nucleic acids are DETECTED. The SARS-CoV-2 RNA is generally detectable in upper and lower  respiratory specimens dur ing the acute phase of infection.  Positive  results are indicative of active infection with SARS-CoV-2.  Clinical  correlation with patient history and other diagnostic information is  necessary to determine patient infection status.  Positive results do  not rule out bacterial infection or co-infection with other viruses. If result is PRESUMPTIVE POSTIVE SARS-CoV-2 nucleic acids MAY BE PRESENT.   A presumptive positive result was obtained on the submitted specimen  and confirmed on repeat testing.  While 2019 novel coronavirus  (SARS-CoV-2) nucleic acids may be present in the submitted sample  additional confirmatory testing may be necessary for epidemiological  and / or clinical management purposes  to differentiate between  SARS-CoV-2 and other Sarbecovirus currently known to infect humans.  If clinically indicated additional testing with an alternate test  methodology [phone]) is advised. The SARS-CoV-2 RNA is generally  detectable in upper and lower respiratory sp ecimens during the acute  phase of infection. The expected result is Negative. Fact Sheet for Patients:  StrictlyIdeas.no Fact Sheet for Healthcare Providers: BankingDealers.co.za This test is not yet approved or cleared by the [place] FDA and has been authorized for detection and/or diagnosis of SARS-CoV-2 by FDA under an Emergency Use Authorization (EUA).  This EUA will remain in effect (meaning this test can be used) for the duration of the COVID-19 declaration under Section 564(b)(1) of the Act, 21 U.S.C. section 360bbb-3(b)(1), unless the authorization is terminated or revoked sooner. Performed at [facility], [place] [address], [place], [place] [address]      Studies: Dg Chest 2 View  Result Date:  08/20/2018 CLINICAL DATA:  28 year old female recently diagnosed with pneumonia. Shortness of breath and fever of 102.7. Reportedly COVID-19 negative. EXAM: CHEST - 2 VIEW COMPARISON:  07/29/2018 and earlier. FINDINGS: Confluent airspace opacity in the right middle lobe and lingula on both views. Elsewhere the lungs appear clear. No pleural effusion. Stable lung volumes. Normal cardiac size and mediastinal contours. Visualized tracheal air column is within normal limits. No acute osseous abnormality identified. Negative visible bowel gas pattern. Stable cholecystectomy clips. IMPRESSION: Confluent right middle lobe and lingula opacity compatible with bilateral pneumonia. No pleural effusion. Electronically Signed   By: Genevie Ann M.D.   On: 08/20/2018 03:27    Scheduled Meds: . enoxaparin (LOVENOX) injection  40  mg Subcutaneous Q24H  . feeding supplement (ENSURE ENLIVE)  237 mL Oral BID BM  . insulin aspart  0-5 Units Subcutaneous QHS  . insulin aspart  0-9 Units Subcutaneous TID WC  . insulin aspart  2 Units Subcutaneous TID WC  . insulin glargine  5 Units Subcutaneous QHS  . metoCLOPramide (REGLAN) injection  10 mg Intravenous Q8H  . mirtazapine  30 mg Oral QHS  . pantoprazole  40 mg Oral BID  . potassium chloride  40 mEq Oral Daily  . sodium chloride flush  3 mL Intravenous Once  . sodium chloride flush  3 mL Intravenous Q12H    Continuous Infusions: . sodium chloride    . ceFEPime (MAXIPIME) IV 2 g (08/21/18 0057)  . vancomycin 1,000 mg (08/20/18 2056)     Flora Lipps, MD  [facility] 08/21/2018

## 2018-08-21 NOTE — Progress Notes (Signed)
Pharmacy Antibiotic Note  [name] is a 28 y.o. female admitted on 08/20/2018 with pneumonia.  Pharmacy has been consulted for Vanco dosing.  ID: PNA, afeb. WBC 8.1. Scr WNL. Failed OP Levaquin  Vanc 5/22 >> Cefepime 5/22 >>  5/22 COVID neg 5/22 BCx:   Vancomycin 750mg  IV Q 12 hrs. Goal AUC 400-550. Expected AUC: 453.8 SCr used: 0.88  Plan: Decrease Vancomycin to 750mg  IV q 12h hrs. Chg cefepime to 2 g IV q8h  Vanco levels at steady state after 3-5 doses if continued.    Height: 5\' 7"  (170.2 cm) Weight: 125 lb 9.6 oz (57 kg) IBW/kg (Calculated) : 61.6  Temp (24hrs), Avg:98 F (36.7 C), Min:97.6 F (36.4 C), Max:98.4 F (36.9 C)  Recent Labs  Lab 08/20/18 0250 08/20/18 0508 08/21/18 0434  WBC 9.6  --  8.1  CREATININE 1.05*  --  0.88  LATICACIDVEN  --  1.5  --     Estimated Creatinine Clearance: 84.9 mL/min (by C-G formula based on SCr of 0.88 mg/dL).    Allergies  Allergen Reactions  . Other Anaphylaxis    Reaction to [place] nuts   . Lactose Intolerance (Gi) Diarrhea   Promiss Labarbera S. Merilynn Finland, PharmD, BCPS Clinical Staff Pharmacist Misty Stanley Stillinger 08/21/2018 1:41 PM

## 2018-08-22 ENCOUNTER — Encounter (INDEPENDENT_AMBULATORY_CARE_PROVIDER_SITE_OTHER): Payer: Self-pay

## 2018-08-22 LAB — BASIC METABOLIC PANEL
Anion gap: 12 (ref 5–15)
BUN: 12 mg/dL (ref 6–20)
CO2: 26 mmol/L (ref 22–32)
Calcium: 8.8 mg/dL — ABNORMAL LOW (ref 8.9–10.3)
Chloride: 97 mmol/L — ABNORMAL LOW (ref 98–111)
Creatinine, Ser: 0.7 mg/dL (ref 0.44–1.00)
GFR calc Af Amer: >60 mL/min (ref >60)
GFR calc non Af Amer: >60 mL/min (ref >60)
Glucose, Bld: 196 mg/dL — ABNORMAL HIGH (ref 70–99)
Potassium: 3.5 mmol/L (ref 3.5–5.1)
Sodium: 135 mmol/L (ref 135–145)

## 2018-08-22 LAB — CBC
HCT: 28.6 % — ABNORMAL LOW (ref 36.0–46.0)
Hemoglobin: 9.3 g/dL — ABNORMAL LOW (ref 12.0–15.0)
MCH: 27 pg (ref 26.0–34.0)
MCHC: 32.5 g/dL (ref 30.0–36.0)
MCV: 83.1 fL (ref 80.0–100.0)
Platelets: 391 10*3/uL (ref 150–400)
RBC: 3.44 MIL/uL — ABNORMAL LOW (ref 3.87–5.11)
RDW: 12 % (ref 11.5–15.5)
WBC: 7.3 10*3/uL (ref 4.0–10.5)
nRBC: 0 % (ref 0.0–0.2)

## 2018-08-22 LAB — GLUCOSE, CAPILLARY
Glucose-Capillary: 198 mg/dL — ABNORMAL HIGH (ref 70–99)
Glucose-Capillary: 101 mg/dL — ABNORMAL HIGH (ref 70–99)

## 2018-08-22 LAB — MAGNESIUM: Magnesium: 1.6 mg/dL — ABNORMAL LOW (ref 1.7–2.4)

## 2018-08-22 MED ORDER — LEVOFLOXACIN 750 MG PO TABS: 750.0000 mg | ORAL_TABLET | Freq: Every day | ORAL | Status: AC

## 2018-08-22 NOTE — Discharge Summary (Signed)
Physician Discharge Summary  [name] UUE:[id number] DOB: Dec 07, 1990 DOA: 08/20/2018  PCP: [name], MD  Admit date: 08/20/2018 Discharge date: 08/22/2018  Admitted From: Home  Discharge disposition: home   Recommendations for Outpatient Follow-Up:    Follow up with your primary care provider in one week.   Discharge Diagnosis:   Principal Problem:   Pneumonia Active Problems:   Chronic abdominal pain   Hypertension associated with diabetes ([place])   Gastroparesis   Diabetes mellitus type 1 (HCC)   MDD (major depressive disorder), recurrent episode, mild (HCC)   Normocytic anemia   Discharge Condition: Improved.  Diet recommendation: Low sodium, heart healthy.  Carbohydrate-modified.     Wound care: None.  Code status: Full.   History of Present Illness:   [name] a 28 y.o.femalewith medical history significant fortype 1 diabetes mellitus with diabetic gastroparesis, hypertension, chronic pain, and depression with anxiety, presented to the emergency department for evaluation of shortness of breath with cough and fevers despite outpatient antibiotics.She reported no improvement at home despite taking Levaquin for 3 or 4 days. She initially denied any abdominal pain in the ED, but reported that she had abdominal pain and nausea consistent with her frequent flares and gastroparesis. Upon arrival to the ED, patient is found to be afebrile, saturating well on room air, tachycardic in 110s, and with SBP 100. EKG features sinus tachycardia with rate 118 and chest x-ray is notable for confluent right middle lobe and lingular opacities concerning for bilateral pneumonia. Chemistry panel had a creatinine 1.05 and albumin of 2.5. CBC is notable for a normocytic anemia with hemoglobin of 9.9, down from 10.8 earlier this month. Blood cultures were collected in the ED, 2 L normal saline was administered, and the patient was given a dose of IV Levaquin.  Hospitalists asked to admit for pneumonia without improvement on outpatient antibiotics.  Hospital Course:  Patient was admitted to hospital and following conditions were addressed during hospitalization,  Multifocal pneumonia presenting with shortness of breath, cough, fever.   Patient did have a negative COVID previously which was repeated in the ED which was negative again. Blood cultures negative prior to discharge. Patient received treatment for HCAP.  Urinary Streptococcus antigen was negative.  Procalcitonin less than 0.10.  Lactate was 1.5. Will continue Levaquin to complete course.   Hypokalemia.    Replenished.  Nausea, vomiting and abdominal pain secondary to diabetic gastroparesis.  Improved. Last hemoglobin A1c of 10 in March 2020.  On Lantus and Humalog at home.  Patient is supposed to follow-up at [facility] for gastric pacer.    Depression with anxiety -Continue Remeron and as-needed Ativan.  Received IV Ativan for extreme anxiety  Anemia Likely anemia of chronic disease.  No evidence of active bleeding.    Acute on chronic pain-generalized body pain, chest pain back pain -Continue home regimen with lidocaine patch and as-needed Flexeril .    Essential hypertension  Accelerated.  Will need closer monitoring as outpatient  Disposition.  At this time, patient is stable for disposition home.    Medical Consultants:    None.   Subjective:   Today, patient feels okay.  Denies any shortness of breath, chest pain, palpitation or fever.  Denies nausea vomiting or diarrhea.  Wishes to go home.  Discharge Exam:   Vitals:   08/22/18 0621 08/22/18 0916  BP: (!) 132/93 126/88  Pulse: (!) 111 (!) 102  Resp: 18 17  Temp: 98.3 F (36.8 C)  SpO2: 100% 96%   Vitals:   08/21/18 1356 08/21/18 2048 08/22/18 0621 08/22/18 0916  BP: (!) 146/98 (!) 161/107 (!) 132/93 126/88  Pulse: (!) 104 (!) 111 (!) 111 (!) 102  Resp: 20 18 18 17   Temp: 98.1 F  (36.7 C) 98.3 F (36.8 C) 98.3 F (36.8 C)   TempSrc: Oral Oral Oral   SpO2: 100% 99% 100% 96%  Weight:      Height:       General exam: Appears calm and comfortable ,Not in distress HEENT:PERRL,Oral mucosa moist Respiratory system: Bilateral equal air entry, normal vesicular breath sounds, no wheezes or crackles  Cardiovascular system: S1 & S2 heard, RRR.  Gastrointestinal system: Abdomen is nondistended, soft and nontender. No organomegaly or masses felt. Normal bowel sounds heard. Central nervous system: Alert and oriented. No focal neurological deficits. Extremities: Trace lower extremity edema, no clubbing ,no cyanosis, distal peripheral pulses palpable. Skin: No rashes, lesions or ulcers,no icterus ,no pallor MSK: Normal muscle bulk,tone ,power    Procedures:    None  The results of significant diagnostics from this hospitalization (including imaging, microbiology, ancillary and laboratory) are listed below for reference.     Diagnostic Studies:   Dg Chest 2 View  Result Date: 08/20/2018 CLINICAL DATA:  28 year old female recently diagnosed with pneumonia. Shortness of breath and fever of 102.7. Reportedly COVID-19 negative. EXAM: CHEST - 2 VIEW COMPARISON:  07/29/2018 and earlier. FINDINGS: Confluent airspace opacity in the right middle lobe and lingula on both views. Elsewhere the lungs appear clear. No pleural effusion. Stable lung volumes. Normal cardiac size and mediastinal contours. Visualized tracheal air column is within normal limits. No acute osseous abnormality identified. Negative visible bowel gas pattern. Stable cholecystectomy clips. IMPRESSION: Confluent right middle lobe and lingula opacity compatible with bilateral pneumonia. No pleural effusion. Electronically Signed   By: Genevie Ann M.D.   On: 08/20/2018 03:27    Labs:   Basic Metabolic Panel: Recent Labs  Lab 08/20/18 0250 08/21/18 0434 08/22/18 0416  NA 138 133* 135  K 3.7 3.2* 3.5  CL 94* 96* 97*   CO2 31 25 26   GLUCOSE 121* 221* 196*  BUN 6 16 12   CREATININE 1.05* 0.88 0.70  CALCIUM 9.1 8.1* 8.8*  MG  --   --  1.6*   GFR Estimated Creatinine Clearance: 93.4 mL/min (by C-G formula based on SCr of 0.7 mg/dL). Liver Function Tests: Recent Labs  Lab 08/20/18 0250  AST 10*  ALT 8  ALKPHOS 108  BILITOT 0.6  PROT 7.9  ALBUMIN 2.5*   Recent Labs  Lab 08/20/18 0250  LIPASE 20   No results for input(s): AMMONIA in the last 168 hours. Coagulation profile No results for input(s): INR, PROTIME in the last 168 hours.  CBC: Recent Labs  Lab 08/20/18 0250 08/21/18 0434 08/22/18 0416  WBC 9.6 8.1 7.3  NEUTROABS  --  5.3  --   HGB 9.9* 8.4* 9.3*  HCT 30.0* 25.3* 28.6*  MCV 84.3 83.2 83.1  PLT 375 323 391   Cardiac Enzymes: No results for input(s): CKTOTAL, CKMB, CKMBINDEX, TROPONINI in the last 168 hours. BNP: Invalid input(s): POCBNP CBG: Recent Labs  Lab 08/21/18 0621 08/21/18 1225 08/21/18 1644 08/21/18 2049 08/22/18 0623  GLUCAP 199* 308* 102* 129* 198*   D-Dimer No results for input(s): DDIMER in the last 72 hours. Hgb A1c No results for input(s): HGBA1C in the last 72 hours. Lipid Profile No results for input(s): CHOL, HDL, LDLCALC, TRIG, CHOLHDL,  LDLDIRECT in the last 72 hours. Thyroid function studies No results for input(s): TSH, T4TOTAL, T3FREE, THYROIDAB in the last 72 hours.  Invalid input(s): FREET3 Anemia work up No results for input(s): VITAMINB12, FOLATE, FERRITIN, TIBC, IRON, RETICCTPCT in the last 72 hours. Microbiology Recent Results (from the past 240 hour(s))  Blood culture (routine x 2)     Status: None (Preliminary result)   Collection Time: 08/20/18  4:30 AM  Result Value Ref Range Status   Specimen Description BLOOD RIGHT ARM  Final   Special Requests   Final    BOTTLES DRAWN AEROBIC AND ANAEROBIC Blood Culture results may not be optimal due to an excessive volume of blood received in culture bottles   Culture   Final    NO  GROWTH 1 DAY Performed at [facility], [place] [address], [place], [place] [address]    Report Status PENDING  Incomplete  Blood culture (routine x 2)     Status: None (Preliminary result)   Collection Time: 08/20/18  4:55 AM  Result Value Ref Range Status   Specimen Description BLOOD LEFT ARM  Final   Special Requests   Final    BOTTLES DRAWN AEROBIC ONLY Blood Culture results may not be optimal due to an excessive volume of blood received in culture bottles   Culture   Final    NO GROWTH 1 DAY Performed at [facility], [place] [address] [place], [place] [address]    Report Status PENDING  Incomplete  SARS Coronavirus 2 (CEPHEID- Performed in [facility] hospital lab), Hosp Order     Status: None   Collection Time: 08/20/18  5:29 AM  Result Value Ref Range Status   SARS Coronavirus 2 NEGATIVE NEGATIVE Final    Comment: (NOTE) If result is NEGATIVE SARS-CoV-2 target nucleic acids are NOT DETECTED. The SARS-CoV-2 RNA is generally detectable in upper and lower  respiratory specimens during the acute phase of infection. The lowest  concentration of SARS-CoV-2 viral copies this assay can detect is 250  copies / mL. A negative result does not preclude SARS-CoV-2 infection  and should not be used as the sole basis for treatment or other  patient management decisions.  A negative result may occur with  improper specimen collection / handling, submission of specimen other  than nasopharyngeal swab, presence of viral mutation(s) within the  areas targeted by this assay, and inadequate number of viral copies  (<250 copies / mL). A negative result must be combined with clinical  observations, patient history, and epidemiological information. If result is POSITIVE SARS-CoV-2 target nucleic acids are DETECTED. The SARS-CoV-2 RNA is generally detectable in upper and lower  respiratory specimens dur ing the acute phase of infection.  Positive  results are indicative of active  infection with SARS-CoV-2.  Clinical  correlation with patient history and other diagnostic information is  necessary to determine patient infection status.  Positive results do  not rule out bacterial infection or co-infection with other viruses. If result is PRESUMPTIVE POSTIVE SARS-CoV-2 nucleic acids MAY BE PRESENT.   A presumptive positive result was obtained on the submitted specimen  and confirmed on repeat testing.  While 2019 novel coronavirus  (SARS-CoV-2) nucleic acids may be present in the submitted sample  additional confirmatory testing may be necessary for epidemiological  and / or clinical management purposes  to differentiate between  SARS-CoV-2 and other Sarbecovirus currently known to infect humans.  If clinically indicated additional testing with an alternate test  methodology [phone]) is advised. The SARS-CoV-2 RNA is generally  detectable in upper and lower respiratory sp ecimens during the acute  phase of infection. The expected result is Negative. Fact Sheet for Patients:  StrictlyIdeas.no Fact Sheet for Healthcare Providers: BankingDealers.co.za This test is not yet approved or cleared by the [place] FDA and has been authorized for detection and/or diagnosis of SARS-CoV-2 by FDA under an Emergency Use Authorization (EUA).  This EUA will remain in effect (meaning this test can be used) for the duration of the COVID-19 declaration under Section 564(b)(1) of the Act, 21 U.S.C. section 360bbb-3(b)(1), unless the authorization is terminated or revoked sooner. Performed at [facility], [place] [address], [place], [place] [address]      Discharge Instructions:   Discharge Instructions    Diet - low sodium heart healthy   Complete by:  As directed    Discharge instructions   Complete by:  As directed    Follow up with your primary care provider in one week. Complete the course of antibiotics. Follow up  with GI at [facility] as scheduled by you.   Increase activity slowly   Complete by:  As directed      Allergies as of 08/22/2018      Reactions   Other Anaphylaxis   Reaction to [place] nuts   Lactose Intolerance (gi) Diarrhea      Medication List    STOP taking these medications   cyclobenzaprine 10 MG tablet Commonly known as:  FLEXERIL   feeding supplement (ENSURE ENLIVE) Liqd   hyoscyamine 0.125 MG SL tablet Commonly known as:  LEVSIN SL   losartan 25 MG tablet Commonly known as:  COZAAR     TAKE these medications   benzonatate 100 MG capsule Commonly known as:  Tessalon Perles Take 1 capsule (100 mg total) by mouth 3 (three) times daily as needed. What changed:  reasons to take this   diclofenac 75 MG EC tablet Commonly known as:  VOLTAREN Take 1 tablet (75 mg total) by mouth 2 (two) times daily. What changed:  when to take this   dicyclomine 20 MG tablet Commonly known as:  BENTYL Take 1 tablet (20 mg total) by mouth 3 (three) times daily as needed for spasms (abdominal cramping).   FreeStyle Libre 14 Day Sensor Misc 1 Device by Does not apply route every 14 (fourteen) days.   furosemide 40 MG tablet Commonly known as:  Lasix Take 1 tablet (40 mg total) by mouth daily.   insulin lispro 100 UNIT/ML injection Commonly known as:  HUMALOG Inject 0.04 mLs (4 Units total) into the skin 3 (three) times daily with meals. What changed:    how much to take  when to take this  additional instructions   Lantus 100 UNIT/ML injection Generic drug:  insulin glargine Inject 0.05 mLs (5 Units total) into the skin at bedtime. Patient using Vials What changed:  how much to take   levofloxacin 750 MG tablet Commonly known as:  LEVAQUIN Take 1 tablet (750 mg total) by mouth daily for 3 days.   lidocaine 5 % Commonly known as:  LIDODERM Place 2 patches onto the skin daily. Remove & Discard patch within 12 hours or as directed by MD   LORazepam 0.5 MG  tablet Commonly known as:  ATIVAN TAKE 1 TABLET BY MOUTH  TWICE A DAY AS NEEDED FOR  ANXIETY What changed:    how much to take  how to take this  when to take this  reasons to take this  additional instructions   metoCLOPramide 10 MG tablet Commonly known as:  REGLAN Take 1 tablet (10 mg total) by mouth every 6 (six) hours as needed for nausea or vomiting.   mirtazapine 30 MG tablet Commonly known as:  Remeron Take 1 tablet (30 mg total) by mouth at bedtime.   ondansetron 4 MG tablet Commonly known as:  Zofran Take 1 tablet (4 mg total) by mouth every 8 (eight) hours as needed for nausea or vomiting (If necessary, dont combine with Reglan.).   pantoprazole 40 MG tablet Commonly known as:  PROTONIX Take 1 tablet (40 mg total) by mouth 2 (two) times daily.   promethazine 12.5 MG suppository Commonly known as:  Phenergan Place 1 suppository (12.5 mg total) rectally every 8 (eight) hours as needed for nausea or vomiting. Note: Do not take while taking oral phenergan What changed:  Another medication with the same name was changed. Make sure you understand how and when to take each.   promethazine 12.5 MG tablet Commonly known as:  PHENERGAN TAKE 1 TABLET BY MOUTH  EVERY 8 HOURS AS NEEDED FOR NAUSEA AND VOMITING What changed:  See the new instructions.       Time coordinating discharge: 39 minutes  Signed:  Halen Mossbarger  [facility] 08/22/2018, 9:35 AM

## 2018-08-25 ENCOUNTER — Ambulatory Visit: Payer: 59 | Admitting: Family Medicine

## 2018-08-25 ENCOUNTER — Telehealth: Payer: Self-pay

## 2018-08-25 LAB — CULTURE, BLOOD (ROUTINE X 2)
Culture: NO GROWTH
Culture: NO GROWTH

## 2018-08-25 NOTE — Telephone Encounter (Signed)
LM for patient to return call for TCM. 

## 2018-08-26 ENCOUNTER — Telehealth: Payer: Self-pay

## 2018-08-26 NOTE — Telephone Encounter (Signed)
LM to return call for TCM.  Patient has appointment scheduled on 08/31/2018.

## 2018-08-31 ENCOUNTER — Ambulatory Visit: Payer: 59 | Admitting: Family Medicine

## 2018-08-31 DIAGNOSIS — Z5329 Procedure and treatment not carried out because of patient's decision for other reasons: Secondary | ICD-10-CM

## 2018-08-31 DIAGNOSIS — Z91199 Patient's noncompliance with other medical treatment and regimen due to unspecified reason: Secondary | ICD-10-CM

## 2018-08-31 NOTE — Progress Notes (Signed)
Patient scheduled for a virtual visit today.  She was not able to be reached by phone and did not log into our video application.

## 2018-09-01 ENCOUNTER — Encounter (HOSPITAL_COMMUNITY): Payer: Self-pay | Admitting: Emergency Medicine

## 2018-09-01 ENCOUNTER — Emergency Department (HOSPITAL_COMMUNITY): Payer: 59

## 2018-09-01 ENCOUNTER — Emergency Department (HOSPITAL_COMMUNITY)
Admission: EM | Admit: 2018-09-01 | Discharge: 2018-09-02 | Discharge status: Against Medical Advice | Payer: 59 | Attending: Emergency Medicine | Admitting: Emergency Medicine

## 2018-09-01 ENCOUNTER — Other Ambulatory Visit: Payer: Self-pay

## 2018-09-01 DIAGNOSIS — R112 Nausea with vomiting, unspecified: Secondary | ICD-10-CM | POA: Diagnosis not present

## 2018-09-01 DIAGNOSIS — Z79899 Other long term (current) drug therapy: Secondary | ICD-10-CM | POA: Insufficient documentation

## 2018-09-01 DIAGNOSIS — E109 Type 1 diabetes mellitus without complications: Secondary | ICD-10-CM | POA: Insufficient documentation

## 2018-09-01 DIAGNOSIS — I10 Essential (primary) hypertension: Secondary | ICD-10-CM

## 2018-09-01 DIAGNOSIS — R0602 Shortness of breath: Secondary | ICD-10-CM | POA: Diagnosis present

## 2018-09-01 DIAGNOSIS — Z5320 Procedure and treatment not carried out because of patient's decision for unspecified reasons: Secondary | ICD-10-CM | POA: Insufficient documentation

## 2018-09-01 DIAGNOSIS — Z794 Long term (current) use of insulin: Secondary | ICD-10-CM | POA: Diagnosis not present

## 2018-09-01 LAB — COMPREHENSIVE METABOLIC PANEL
ALT: 28 U/L (ref 0–44)
AST: 24 U/L (ref 15–41)
Albumin: 3 g/dL — ABNORMAL LOW (ref 3.5–5.0)
Alkaline Phosphatase: 96 U/L (ref 38–126)
Anion gap: 14 (ref 5–15)
BUN: 9 mg/dL (ref 6–20)
CO2: 30 mmol/L (ref 22–32)
Calcium: 9.3 mg/dL (ref 8.9–10.3)
Chloride: 94 mmol/L — ABNORMAL LOW (ref 98–111)
Creatinine, Ser: 0.96 mg/dL (ref 0.44–1.00)
GFR calc Af Amer: >60 mL/min (ref >60)
GFR calc non Af Amer: >60 mL/min (ref >60)
Glucose, Bld: 342 mg/dL — ABNORMAL HIGH (ref 70–99)
Potassium: 3.8 mmol/L (ref 3.5–5.1)
Sodium: 138 mmol/L (ref 135–145)
Total Bilirubin: 0.7 mg/dL (ref 0.3–1.2)
Total Protein: 7.5 g/dL (ref 6.5–8.1)

## 2018-09-01 LAB — CBC
HCT: 29.3 % — ABNORMAL LOW (ref 36.0–46.0)
Hemoglobin: 9.5 g/dL — ABNORMAL LOW (ref 12.0–15.0)
MCH: 27.5 pg (ref 26.0–34.0)
MCHC: 32.4 g/dL (ref 30.0–36.0)
MCV: 84.9 fL (ref 80.0–100.0)
Platelets: 252 10*3/uL (ref 150–400)
RBC: 3.45 MIL/uL — ABNORMAL LOW (ref 3.87–5.11)
RDW: 12.3 % (ref 11.5–15.5)
WBC: 5.1 10*3/uL (ref 4.0–10.5)
nRBC: 0 % (ref 0.0–0.2)

## 2018-09-01 LAB — URINALYSIS, ROUTINE W REFLEX MICROSCOPIC
Bilirubin Urine: NEGATIVE
Glucose, UA: >=500 mg/dL — AB
Ketones, ur: 20 mg/dL — AB
Leukocytes,Ua: NEGATIVE
Nitrite: NEGATIVE
Protein, ur: >=300 mg/dL — AB
Specific Gravity, Urine: 1.02 (ref 1.005–1.030)
pH: 6 (ref 5.0–8.0)

## 2018-09-01 LAB — I-STAT BETA HCG BLOOD, ED (MC, WL, AP ONLY): I-stat hCG, quantitative: <5 m[IU]/mL (ref <5)

## 2018-09-01 LAB — LIPASE, BLOOD: Lipase: 19 U/L (ref 11–51)

## 2018-09-01 IMAGING — DX CHEST - 2 VIEW
2 series · 2 of 2 positions shown · non-contrast
Comparison: [DATE]

CLINICAL DATA: History of pneumonia.  Productive cough.

EXAM:
CHEST - 2 VIEW

[chest ap]
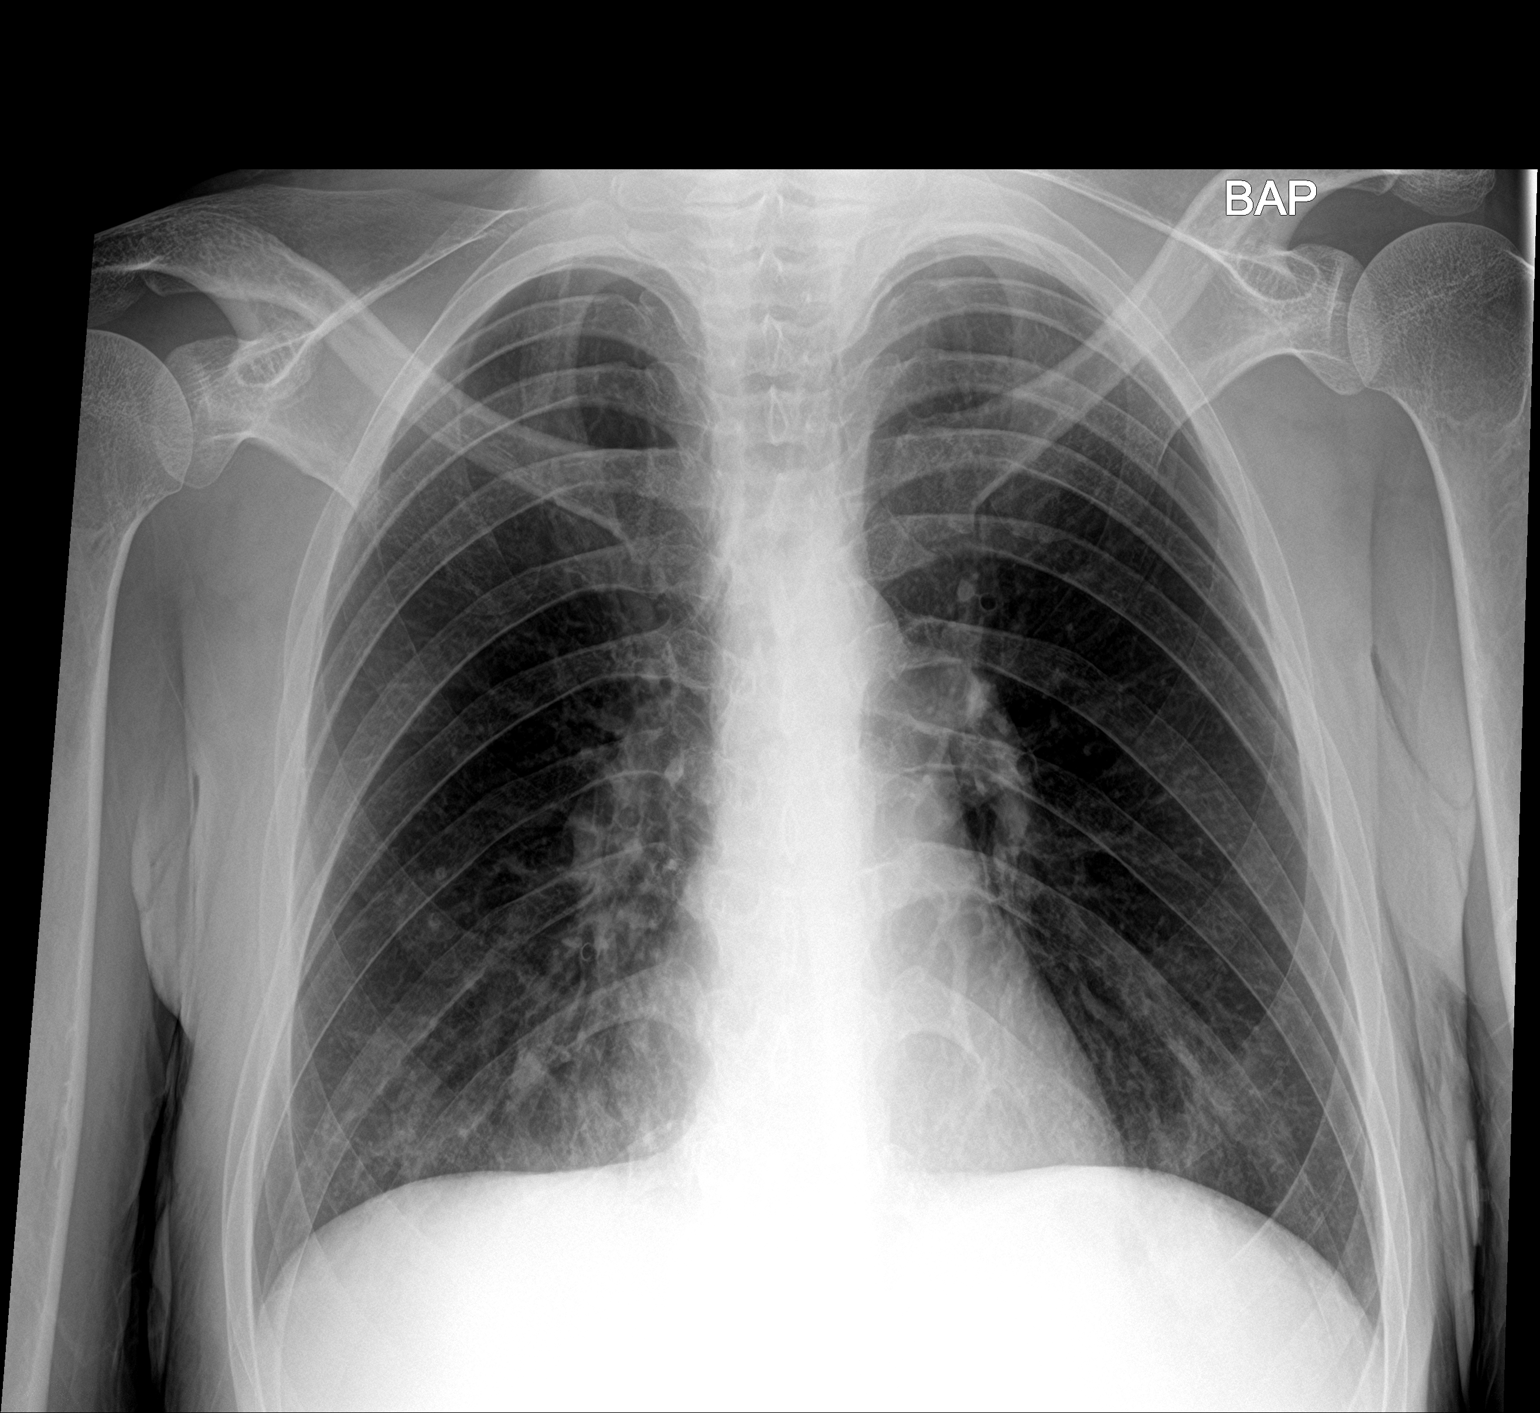

[chest lat]
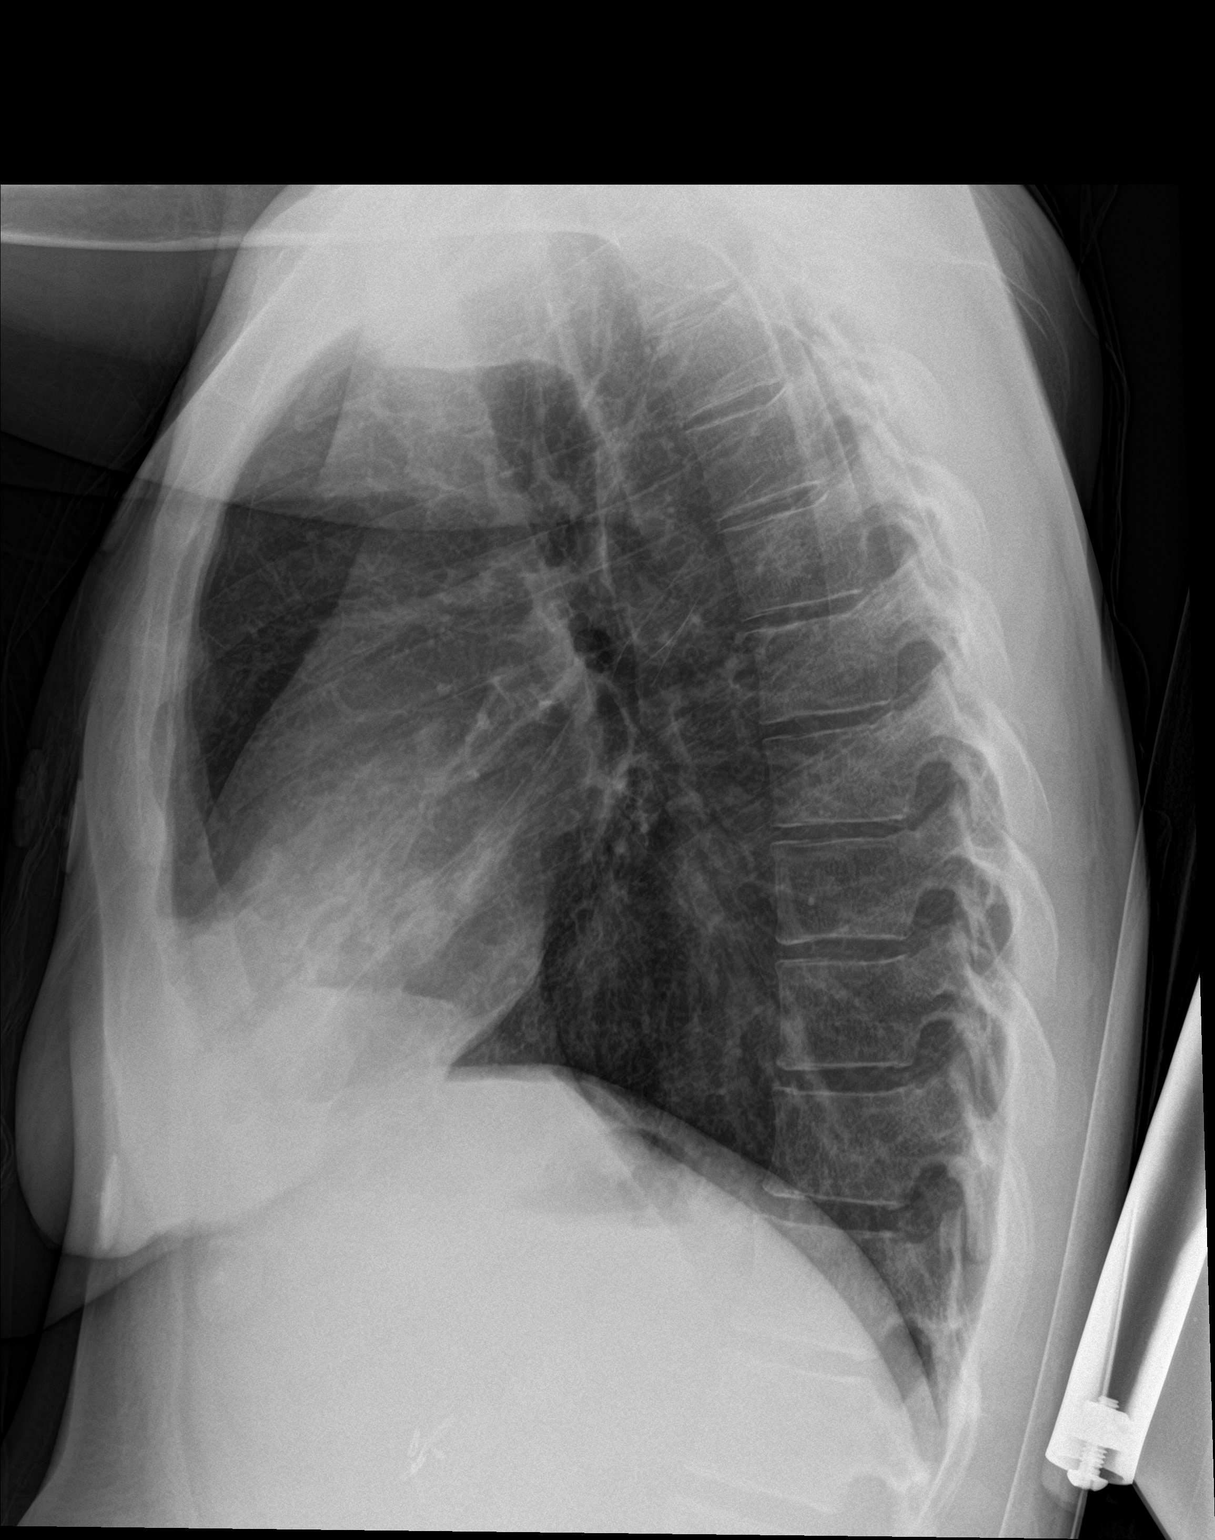

[2 of 2 positions shown; findings below may reference images not displayed]

FINDINGS: The cardiac silhouette, mediastinal and hilar contours are within
normal limits and stable. There is some persistent streaky airspace
opacity in the right middle lobe suggesting persistent pneumonia.
The left lung is clear. No pleural effusions. The bony thorax is
intact.
IMPRESSION: Persistent right middle lobe infiltrate.

## 2018-09-01 MED ORDER — METOCLOPRAMIDE HCL 5 MG/ML IJ SOLN
10.0000 mg | INTRAMUSCULAR | Status: AC
  Administered 2018-09-02: 10 mg via INTRAVENOUS
  Filled 2018-09-01: qty 2

## 2018-09-01 MED ORDER — PANTOPRAZOLE SODIUM 40 MG IV SOLR
40.0000 mg | Freq: Once | INTRAVENOUS | Status: AC
  Administered 2018-09-02: 40 mg via INTRAVENOUS
  Filled 2018-09-01: qty 40

## 2018-09-01 MED ORDER — KETOROLAC TROMETHAMINE 30 MG/ML IJ SOLN
15.0000 mg | Freq: Once | INTRAMUSCULAR | Status: AC
  Administered 2018-09-02: 15 mg via INTRAVENOUS
  Filled 2018-09-01: qty 1

## 2018-09-01 MED ORDER — SODIUM CHLORIDE 0.9 % IV BOLUS
2000.0000 mL | Freq: Once | INTRAVENOUS | Status: AC
  Administered 2018-09-02: 1000 mL via INTRAVENOUS

## 2018-09-01 MED ORDER — SODIUM CHLORIDE 0.9% FLUSH: 3.0000 mL | Freq: Once | INTRAVENOUS | Status: DC

## 2018-09-01 NOTE — ED Notes (Signed)
RN attempted x2 with IV, IV team consulted.

## 2018-09-01 NOTE — ED Provider Notes (Signed)
[facility] EMERGENCY DEPARTMENT Provider Note   CSN: [id number] Arrival date & time: 09/01/18  1639    History   Chief Complaint Chief Complaint  Patient presents with  . Vomiting  . Shortness of Breath    HPI [name] is a 28 y.o. female.     28 y.o. female with medical history significant for type 1 diabetes mellitus with diabetic gastroparesis, hypertension, chronic pain, and depression presents to the ED for multiple complaints.  Patient states that she has been experiencing nausea and vomiting over the past 2 days.  Notes 7-8 episodes of nonbloody emesis in the past 24 hours.  She is also been having looser stool which she classifies diarrhea.  This has been nonbloody as well.  N/V/D associated with pain in the upper abdomen.  These symptoms are similar to past episodes of gastroparesis.  She did not take her insulin today because she has not had an appetite.  Was recently admitted for healthcare associated pneumonia and was discharged on Levaquin.  She has completed her full course of antibiotics.  Has not been febrile in over a week.  Continues to experience some discomfort in her chest as well as shortness of breath.  She has not had any lightheadedness, syncope.  The history is provided by the patient. No language interpreter was used.  Shortness of Breath    Past Medical History:  Diagnosis Date  . Allergy   . Anemia   . Anxiety   . Blood transfusion without reported diagnosis    Dec 2018  . Cataract    right eye  . Depression   . Diabetes type 1, uncontrolled (HCC) 11/14/2011   Since age 50  . Fibromyalgia   . Gastroparesis   . GERD (gastroesophageal reflux disease)   . Hypertension   . Infection    UTI April 2016    Patient Active Problem List   Diagnosis Date Noted  . Pneumonia 08/20/2018  . Contraception management 08/06/2018  . Intractable vomiting with nausea 07/30/2018  . Cyclical vomiting syndrome 06/07/2018  . Diabetic  gastroparesis associated with type 1 diabetes mellitus (HCC) 06/04/2018  . Anxiety 05/31/2018  . Peripheral edema 05/31/2018  . Normocytic anemia 07/10/2017  . GERD (gastroesophageal reflux disease) 06/23/2017  . 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 Diabetes mellitus type 1 (HCC) 04/28/2013  . Protein-calorie malnutrition, severe (HCC) 04/28/2013  . Hypertension associated with diabetes (HCC) 11/14/2011  . Gastroparesis 11/14/2011  . Chronic abdominal pain 09/18/2011    Past Surgical History:  Procedure Laterality Date  . ANKLE SURGERY    . CHOLECYSTECTOMY  11/15/2011   Procedure: LAPAROSCOPIC CHOLECYSTECTOMY WITH INTRAOPERATIVE CHOLANGIOGRAM;  Surgeon: [name], MD;  Location: [facility];  Service: General;  Laterality: N/A;  . COLONOSCOPY    . COLONOSCOPY WITH PROPOFOL N/A 06/27/2017   Procedure: COLONOSCOPY WITH PROPOFOL;  Surgeon: Rachael Fee, MD;  Location: [facility];  Service: Endoscopy;  Laterality: N/A;  . ESOPHAGOGASTRODUODENOSCOPY  12/03/2011   Procedure: ESOPHAGOGASTRODUODENOSCOPY (EGD);  Surgeon: Theda Belfast, MD;  Location: [facility];  Service: Endoscopy;  Laterality: N/A;  . FLEXIBLE SIGMOIDOSCOPY N/A 03/10/2017   Procedure: FLEXIBLE SIGMOIDOSCOPY;  Surgeon: Jeani Hawking, MD;  Location: [facility];  Service: Endoscopy;  Laterality: N/A;  . INCISION AND DRAINAGE PERIRECTAL ABSCESS N/A 03/01/2017   Procedure: IRRIGATION AND DEBRIDEMENT PERIRECTAL ABSCESS;  Surgeon: Ovidio Kin, MD;  Location: [facility];  Service: General;  Laterality: N/A;  . IRRIGATION  AND DEBRIDEMENT BUTTOCKS N/A 03/23/2017   Procedure: IRRIGATION AND DEBRIDEMENT BUTTOCKS, SETON PLACEMENT;  Surgeon: Romie Levee, MD;  Location: [facility];  Service: General;  Laterality: N/A;  . LAPAROSCOPY  11/23/2011   Procedure: LAPAROSCOPY DIAGNOSTIC;  Surgeon: Mariella Saa, MD;  Location: [facility];  Service: General;  Laterality: N/A;  . SIGMOIDOSCOPY    . UPPER  GASTROINTESTINAL ENDOSCOPY    . WISDOM TOOTH EXTRACTION       OB History    Gravida  1   Para  1   Term      Preterm  1   AB      Living  1     SAB      TAB      Ectopic      Multiple  0   Live Births  1            Home Medications    Prior to Admission medications   Medication Sig Start Date End Date Taking? Authorizing Provider  benzonatate (TESSALON PERLES) 100 MG capsule Take 1 capsule (100 mg total) by mouth 3 (three) times daily as needed. Patient taking differently: Take 100 mg by mouth 3 (three) times daily as needed for cough.  08/13/18   Daphine Deutscher Mary-Margaret, FNP  Continuous Blood Gluc Sensor (FREESTYLE LIBRE 14 DAY SENSOR) MISC 1 Device by Does not apply route every 14 (fourteen) days. 06/22/18   Ardith Dark, MD  diclofenac (VOLTAREN) 75 MG EC tablet Take 1 tablet (75 mg total) by mouth 2 (two) times daily. Patient taking differently: Take 75 mg by mouth daily.  08/06/18   Ardith Dark, MD  dicyclomine (BENTYL) 20 MG tablet Take 1 tablet (20 mg total) by mouth 3 (three) times daily as needed for spasms (abdominal cramping). 06/03/18   Long, Arlyss Repress, MD  furosemide (LASIX) 40 MG tablet Take 1 tablet (40 mg total) by mouth daily. 07/20/18   Armbruster, Willaim Rayas, MD  insulin lispro (HUMALOG) 100 UNIT/ML injection Inject 0.04 mLs (4 Units total) into the skin 3 (three) times daily with meals. Patient taking differently: Inject 0-10 Units into the skin See admin instructions. Sliding scale 06/18/18   Romero Belling, MD  LANTUS 100 UNIT/ML injection Inject 0.05 mLs (5 Units total) into the skin at bedtime. Patient using Vials Patient taking differently: Inject 8 Units into the skin at bedtime. Patient using Vials 06/18/18   Romero Belling, MD  lidocaine (LIDODERM) 5 % Place 2 patches onto the skin daily. Remove & Discard patch within 12 hours or as directed by MD 08/03/18   Calvert Cantor, MD  LORazepam (ATIVAN) 0.5 MG tablet TAKE 1 TABLET BY MOUTH  TWICE A DAY AS  NEEDED FOR  ANXIETY Patient taking differently: Take 0.5 mg by mouth 2 (two) times daily as needed for anxiety.  08/19/18   Ardith Dark, MD  metoCLOPramide (REGLAN) 10 MG tablet Take 1 tablet (10 mg total) by mouth every 6 (six) hours as needed for nausea or vomiting. 02/20/18   Burnadette Pop, MD  mirtazapine (REMERON) 30 MG tablet Take 1 tablet (30 mg total) by mouth at bedtime. 05/31/18   Ardith Dark, MD  ondansetron (ZOFRAN) 4 MG tablet Take 1 tablet (4 mg total) by mouth every 8 (eight) hours as needed for nausea or vomiting (If necessary, dont combine with Reglan.). 03/25/18   Amin, Loura Halt, MD  pantoprazole (PROTONIX) 40 MG tablet Take 1 tablet (40 mg total) by mouth 2 (two)  times daily. 06/10/18   Marguerita Merles Latif, DO  promethazine (PHENERGAN) 12.5 MG suppository Place 1 suppository (12.5 mg total) rectally every 8 (eight) hours as needed for nausea or vomiting. Note: Do not take while taking oral phenergan 07/27/18   Armbruster, Willaim Rayas, MD  promethazine (PHENERGAN) 12.5 MG tablet TAKE 1 TABLET BY MOUTH  EVERY 8 HOURS AS NEEDED FOR NAUSEA AND VOMITING Patient taking differently: Take 12.5 mg by mouth every 8 (eight) hours as needed for nausea or vomiting.  08/11/18   Armbruster, Willaim Rayas, MD    Family History Family History  Problem Relation Age of Onset  . Diabetes Mother   . Hypertension Father   . Colon cancer Paternal Grandmother        pt thinks PGM was dx in her 38's  . Diabetes Paternal Grandmother   . Diabetes Maternal Grandmother   . Diabetes Maternal Grandfather   . Diabetes Paternal Grandfather   . Diabetes Other   . Esophageal cancer Neg Hx   . Liver cancer Neg Hx   . Pancreatic cancer Neg Hx   . Stomach cancer Neg Hx   . Rectal cancer Neg Hx     Social History Social History   Tobacco Use  . Smoking status: Never Smoker  . Smokeless tobacco: Never Used  Substance Use Topics  . Alcohol use: No    Alcohol/week: 0.0 standard drinks    Frequency:  Never  . Drug use: No     Allergies   Other and Lactose intolerance (gi)   Review of Systems Review of Systems  Respiratory: Positive for shortness of breath.   Ten systems reviewed and are negative for acute change, except as noted in the HPI.    Physical Exam Updated Vital Signs BP (!) 182/119   Pulse (!) 120   Temp 98.4 F (36.9 C) (Oral)   Resp 15   LMP 08/19/2018 Comment: pt shielded  SpO2 97%   Physical Exam Vitals signs and nursing note reviewed.  Constitutional:      General: She is not in acute distress.    Appearance: She is well-developed. She is not diaphoretic.     Comments: Patient intermittently tearful. Nontoxic.  HENT:     Head: Normocephalic and atraumatic.  Eyes:     General: No scleral icterus.    Conjunctiva/sclera: Conjunctivae normal.  Neck:     Musculoskeletal: Normal range of motion.  Cardiovascular:     Rate and Rhythm: Regular rhythm. Tachycardia present.     Pulses: Normal pulses.  Pulmonary:     Effort: Pulmonary effort is normal. No respiratory distress.     Breath sounds: No stridor. No wheezing, rhonchi or rales.     Comments: Respirations even and unlabored. Lungs grossly CTAB. No rales appreciated. Abdominal:     Comments: Fairly benign abdominal exam. Minimal TTP in the epigastrium. No distension, peritoneal signs, guarding.  Musculoskeletal: Normal range of motion.  Skin:    General: Skin is warm and dry.     Coloration: Skin is not pale.     Findings: No erythema or rash.  Neurological:     General: No focal deficit present.     Mental Status: She is alert and oriented to person, place, and time.     Coordination: Coordination normal.     Comments: GCS 15. Moving all extremities spontaneously.  Psychiatric:        Behavior: Behavior normal.      ED Treatments / Results  Labs (  all labs ordered are listed, but only abnormal results are displayed) Labs Reviewed  COMPREHENSIVE METABOLIC PANEL - Abnormal; Notable for  the following components:      Result Value   Chloride 94 (*)    Glucose, Bld 342 (*)    Albumin 3.0 (*)    All other components within normal limits  CBC - Abnormal; Notable for the following components:   RBC 3.45 (*)    Hemoglobin 9.5 (*)    HCT 29.3 (*)    All other components within normal limits  URINALYSIS, ROUTINE W REFLEX MICROSCOPIC - Abnormal; Notable for the following components:   APPearance HAZY (*)    Glucose, UA >=500 (*)    Hgb urine dipstick MODERATE (*)    Ketones, ur 20 (*)    Protein, ur >=300 (*)    Bacteria, UA RARE (*)    All other components within normal limits  LIPASE, BLOOD  I-STAT BETA HCG BLOOD, ED ([facility], [facility], [facility] ONLY)    EKG EKG Interpretation  Date/Time:  Wednesday September 01 2018 22:01:41 EDT Ventricular Rate:  119 PR Interval:    QRS Duration: 81 QT Interval:  320 QTC Calculation: 451 R Axis:   70 Text Interpretation:  Sinus tachycardia ST elev, probable normal early repol pattern No significant change since last tracing Confirmed by Zadie Rhine ([id number]) on 09/02/2018 12:01:52 AM   Radiology Dg Chest 2 View  Result Date: 09/01/2018 CLINICAL DATA:  History of pneumonia.  Productive cough. EXAM: CHEST - 2 VIEW COMPARISON:  08/20/2018 FINDINGS: The cardiac silhouette, mediastinal and hilar contours are within normal limits and stable. There is some persistent streaky airspace opacity in the right middle lobe suggesting persistent pneumonia. The left lung is clear. No pleural effusions. The bony thorax is intact. IMPRESSION: Persistent right middle lobe infiltrate. Electronically Signed   By: Rudie Meyer M.D.   On: 09/01/2018 17:33    Procedures Procedures (including critical care time)  Medications Ordered in ED Medications  sodium chloride flush (NS) 0.9 % injection 3 mL (has no administration in time range)  metoCLOPramide (REGLAN) injection 10 mg (10 mg Intravenous Given 09/02/18 0019)  sodium chloride 0.9 % bolus 2,000 mL (1,000 mLs  Intravenous New Bag/Given 09/02/18 0027)  ketorolac (TORADOL) 30 MG/ML injection 15 mg (15 mg Intravenous Given 09/02/18 0020)  pantoprazole (PROTONIX) injection 40 mg (40 mg Intravenous Given 09/02/18 0018)     Initial Impression / Assessment and Plan / ED Course  I have reviewed the triage vital signs and the nursing notes.  Pertinent labs & imaging results that were available during my care of the patient were reviewed by me and considered in my medical decision making (see chart for details).        1:00 AM Called to the patient's room as she was requesting to speak with the provider.  Walked in to find the patient sitting on the edge of her bed.  She was noted to be anxious and tearful.  Significantly tachycardic.  Mildly hyperventilating.  She states that she is having a panic attack.  She takes anxiety medication as needed, but only took it earlier today.  She is requesting to leave the emergency department.  I have offered the patient a dose of anxiety medication to have her remain in the ED for continued hydration.  She declines medication and continues to insist on discharge.  I further expressed my concern about her ability to tolerate oral fluids.  She states that she was  able to drink Gatorade, Pedialyte, water at home.  She has not been visualized to have any vomiting in the ED.  Patient reports that she usually has her husband with her in the emergency department and she is not comfortable being here by herself.  Patient hypertensive and tachycardic.  I have explained to the patient my hesitancy about discharging her with these vital signs.  Given that she remains insistent on leaving, will discharge AGAINST MEDICAL ADVICE.  Patient verbalizes understanding that she is at risk for clinical decompensation including, but not limited to, worsening condition, organ failure, death.  The patient has the capacity to make the decision to leave the department at this time.  She has been encouraged  to follow-up with her primary care doctor.   Final Clinical Impressions(s) / ED Diagnoses   Final diagnoses:  Non-intractable vomiting with nausea, unspecified vomiting type  Hypertension not at goal    ED Discharge Orders    None       Antony MaduraHumes, Asiya Cutbirth, PA-C 09/02/18 [id number]    Geoffery Lyonselo, Douglas, MD 09/02/18 2138

## 2018-09-01 NOTE — ED Notes (Signed)
ED Provider at bedside. 

## 2018-09-01 NOTE — ED Triage Notes (Signed)
Pt c/o nausea and vomiting that started yesterday, hx DM type 1. Also reports increase shortness of breath, hx recent pneumonia.

## 2018-09-02 ENCOUNTER — Encounter: Payer: Self-pay | Admitting: *Deleted

## 2018-09-02 NOTE — Discharge Instructions (Signed)
Continue your daily prescribed medications. Follow up with your primary care doctor.  You have chosen to leave the emergency department AGAINST MEDICAL ADVICE.  You understand that leaving the emergency department prematurely may cause clinical decline including, but not limited to, worsening condition, organ failure, or death. We encourage your return for any new or concerning symptoms.

## 2018-09-02 NOTE — ED Notes (Signed)
Reglan for pt given to tray RN

## 2018-09-03 ENCOUNTER — Ambulatory Visit: Payer: 59 | Admitting: Family Medicine

## 2018-09-17 ENCOUNTER — Ambulatory Visit: Payer: 59 | Admitting: Family Medicine

## 2018-09-17 ENCOUNTER — Emergency Department (HOSPITAL_COMMUNITY)
Admission: EM | Admit: 2018-09-17 | Discharge: 2018-09-18 | Disposition: A | Discharge status: Home / Self Care | Payer: 59 | Source: Home / Self Care | Attending: Emergency Medicine | Admitting: Emergency Medicine

## 2018-09-17 ENCOUNTER — Encounter (HOSPITAL_COMMUNITY): Payer: Self-pay | Admitting: Emergency Medicine

## 2018-09-17 ENCOUNTER — Emergency Department (HOSPITAL_COMMUNITY): Payer: 59

## 2018-09-17 ENCOUNTER — Other Ambulatory Visit: Payer: Self-pay

## 2018-09-17 DIAGNOSIS — E1043 Type 1 diabetes mellitus with diabetic autonomic (poly)neuropathy: Secondary | ICD-10-CM | POA: Insufficient documentation

## 2018-09-17 DIAGNOSIS — Z79899 Other long term (current) drug therapy: Secondary | ICD-10-CM | POA: Insufficient documentation

## 2018-09-17 DIAGNOSIS — E860 Dehydration: Secondary | ICD-10-CM

## 2018-09-17 DIAGNOSIS — E1159 Type 2 diabetes mellitus with other circulatory complications: Secondary | ICD-10-CM | POA: Insufficient documentation

## 2018-09-17 DIAGNOSIS — N179 Acute kidney failure, unspecified: Secondary | ICD-10-CM | POA: Diagnosis not present

## 2018-09-17 DIAGNOSIS — E1065 Type 1 diabetes mellitus with hyperglycemia: Secondary | ICD-10-CM | POA: Insufficient documentation

## 2018-09-17 DIAGNOSIS — E1010 Type 1 diabetes mellitus with ketoacidosis without coma: Secondary | ICD-10-CM | POA: Diagnosis not present

## 2018-09-17 DIAGNOSIS — I10 Essential (primary) hypertension: Secondary | ICD-10-CM | POA: Insufficient documentation

## 2018-09-17 DIAGNOSIS — Z9119 Patient's noncompliance with other medical treatment and regimen: Secondary | ICD-10-CM | POA: Insufficient documentation

## 2018-09-17 DIAGNOSIS — E1143 Type 2 diabetes mellitus with diabetic autonomic (poly)neuropathy: Secondary | ICD-10-CM

## 2018-09-17 DIAGNOSIS — R739 Hyperglycemia, unspecified: Secondary | ICD-10-CM

## 2018-09-17 DIAGNOSIS — Z9114 Patient's other noncompliance with medication regimen: Secondary | ICD-10-CM

## 2018-09-17 DIAGNOSIS — Z20828 Contact with and (suspected) exposure to other viral communicable diseases: Secondary | ICD-10-CM | POA: Insufficient documentation

## 2018-09-17 LAB — BASIC METABOLIC PANEL
Anion gap: 15 (ref 5–15)
BUN: 11 mg/dL (ref 6–20)
CO2: 22 mmol/L (ref 22–32)
Calcium: 10.1 mg/dL (ref 8.9–10.3)
Chloride: 97 mmol/L — ABNORMAL LOW (ref 98–111)
Creatinine, Ser: 0.75 mg/dL (ref 0.44–1.00)
GFR calc Af Amer: >60 mL/min (ref >60)
GFR calc non Af Amer: >60 mL/min (ref >60)
Glucose, Bld: 408 mg/dL — ABNORMAL HIGH (ref 70–99)
Potassium: 4.7 mmol/L (ref 3.5–5.1)
Sodium: 134 mmol/L — ABNORMAL LOW (ref 135–145)

## 2018-09-17 LAB — POCT I-STAT EG7
Acid-Base Excess: 10 mmol/L — ABNORMAL HIGH (ref 0.0–2.0)
Bicarbonate: 26.4 mmol/L (ref 20.0–28.0)
Calcium, Ion: 0.92 mmol/L — ABNORMAL LOW (ref 1.15–1.40)
HCT: 33 % — ABNORMAL LOW (ref 36.0–46.0)
Hemoglobin: 11.2 g/dL — ABNORMAL LOW (ref 12.0–15.0)
O2 Saturation: 100 %
Potassium: 4.5 mmol/L (ref 3.5–5.1)
Sodium: 132 mmol/L — ABNORMAL LOW (ref 135–145)
TCO2: 27 mmol/L (ref 22–32)
pCO2, Ven: 16.7 mmHg — CL (ref 44.0–60.0)
pH, Ven: 7.807 (ref 7.250–7.430)
pO2, Ven: 163 mmHg — ABNORMAL HIGH (ref 32.0–45.0)

## 2018-09-17 LAB — CBC WITH DIFFERENTIAL/PLATELET
Abs Immature Granulocytes: 0 10*3/uL (ref 0.00–0.07)
Basophils Absolute: 0.1 10*3/uL (ref 0.0–0.1)
Basophils Relative: 1 %
Eosinophils Absolute: 0.2 10*3/uL (ref 0.0–0.5)
Eosinophils Relative: 4 %
HCT: 34.4 % — ABNORMAL LOW (ref 36.0–46.0)
Hemoglobin: 11.3 g/dL — ABNORMAL LOW (ref 12.0–15.0)
Immature Granulocytes: 0 %
Lymphocytes Relative: 31 %
Lymphs Abs: 1.5 10*3/uL (ref 0.7–4.0)
MCH: 27.5 pg (ref 26.0–34.0)
MCHC: 32.8 g/dL (ref 30.0–36.0)
MCV: 83.7 fL (ref 80.0–100.0)
Monocytes Absolute: 0.3 10*3/uL (ref 0.1–1.0)
Monocytes Relative: 6 %
Neutro Abs: 2.9 10*3/uL (ref 1.7–7.7)
Neutrophils Relative %: 58 %
Platelets: 191 10*3/uL (ref 150–400)
RBC: 4.11 MIL/uL (ref 3.87–5.11)
RDW: 12.8 % (ref 11.5–15.5)
WBC: 4.9 10*3/uL (ref 4.0–10.5)
nRBC: 0 % (ref 0.0–0.2)

## 2018-09-17 LAB — CBG MONITORING, ED
Glucose-Capillary: 275 mg/dL — ABNORMAL HIGH (ref 70–99)
Glucose-Capillary: 338 mg/dL — ABNORMAL HIGH (ref 70–99)
Glucose-Capillary: 359 mg/dL — ABNORMAL HIGH (ref 70–99)
Glucose-Capillary: 385 mg/dL — ABNORMAL HIGH (ref 70–99)

## 2018-09-17 LAB — BETA-HYDROXYBUTYRIC ACID: Beta-Hydroxybutyric Acid: 1.86 mmol/L — ABNORMAL HIGH (ref 0.05–0.27)

## 2018-09-17 LAB — I-STAT BETA HCG BLOOD, ED (MC, WL, AP ONLY): I-stat hCG, quantitative: <5 m[IU]/mL (ref <5)

## 2018-09-17 IMAGING — DX PORTABLE CHEST - 1 VIEW
1 series · 1 of 1 positions shown · non-contrast
Comparison: [DATE]

CLINICAL DATA: DKA. Nausea and vomiting.

EXAM:
PORTABLE CHEST 1 VIEW

[chest ap]
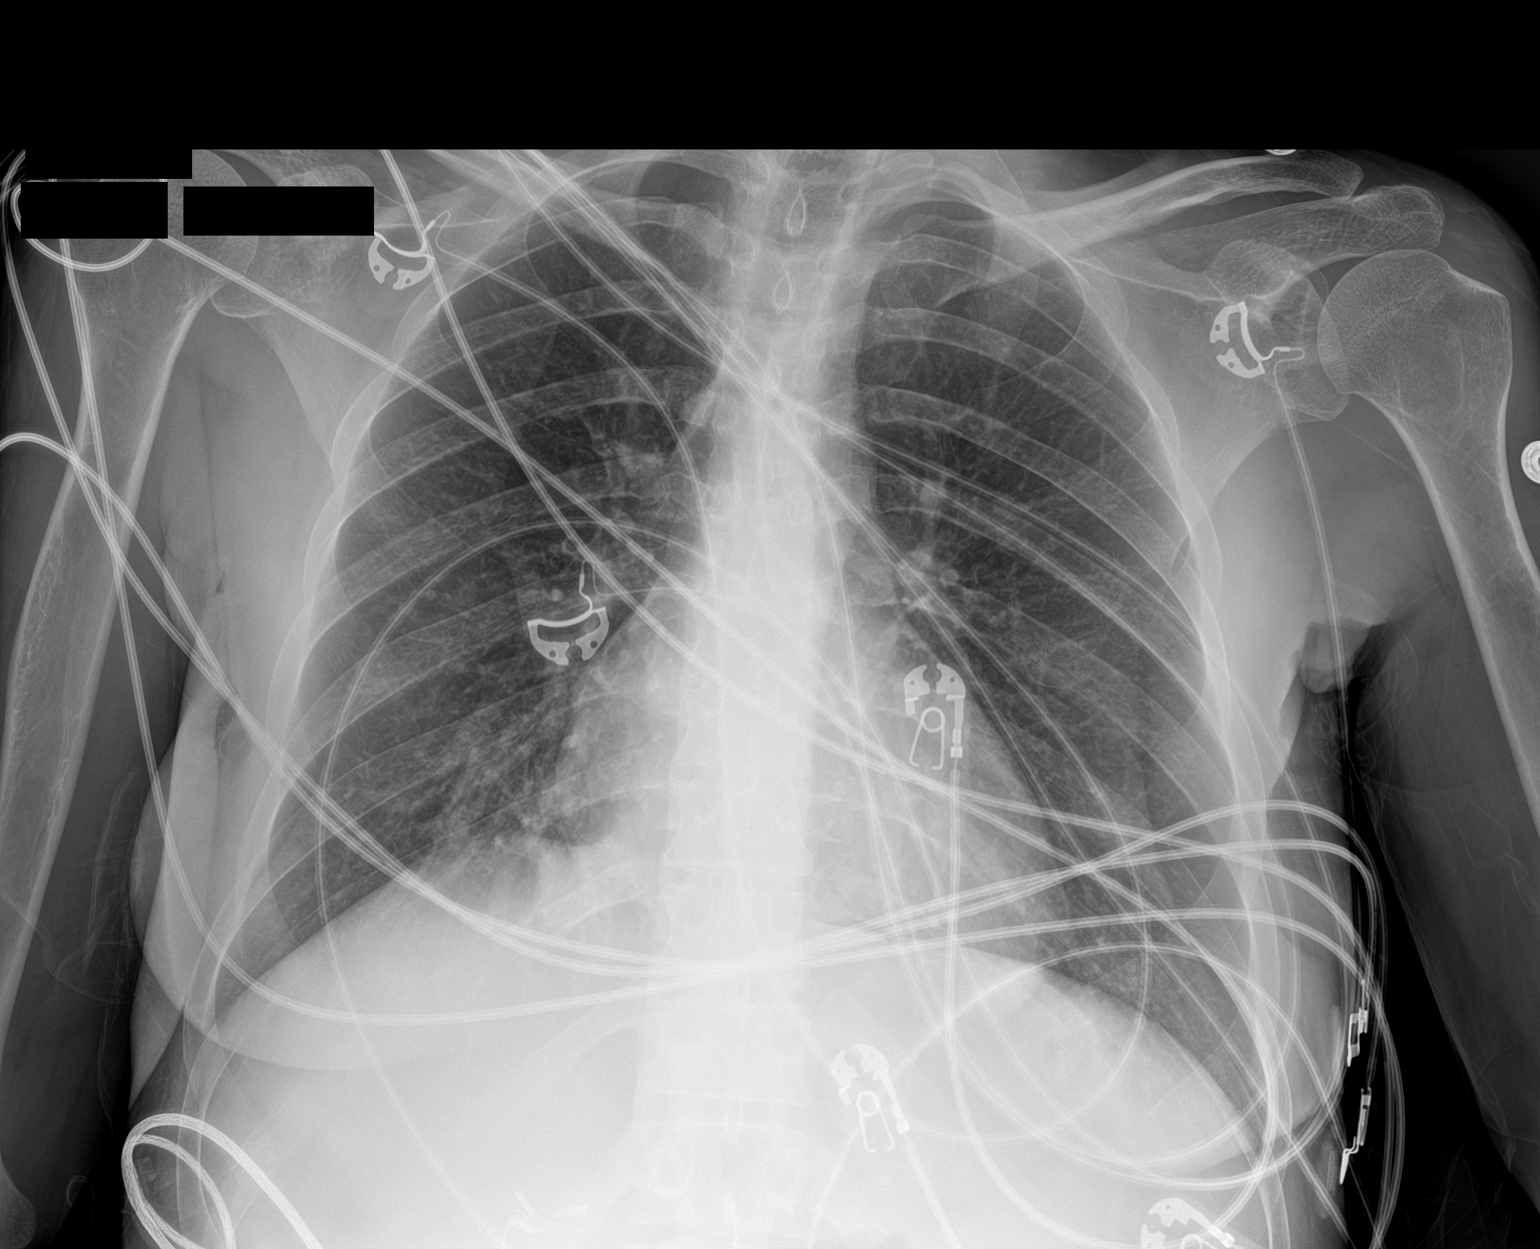

[1 of 1 positions shown; findings below may reference images not displayed]

FINDINGS: The cardiomediastinal contours are normal. Unchanged patchy right
infrahilar opacity, which on prior frontal and lateral views
localized to the right middle lobe. No new airspace disease.
Pulmonary vasculature is normal. No pleural effusion or
pneumothorax. No acute osseous abnormalities are seen.
IMPRESSION: Patchy right infrahilar opacity is unchanged from radiographs
earlier this month. No new abnormality.

## 2018-09-17 MED ORDER — MORPHINE SULFATE (PF) 4 MG/ML IV SOLN
4.0000 mg | Freq: Once | INTRAVENOUS | Status: AC
  Administered 2018-09-17: 4 mg via INTRAVENOUS
  Filled 2018-09-17: qty 1

## 2018-09-17 MED ORDER — SODIUM CHLORIDE 0.9 % IV SOLN
INTRAVENOUS | Status: DC
  Administered 2018-09-17: 23:00:00 via INTRAVENOUS

## 2018-09-17 MED ORDER — SODIUM CHLORIDE 0.9 % IV BOLUS
1000.0000 mL | Freq: Once | INTRAVENOUS | Status: AC
  Administered 2018-09-17: 1000 mL via INTRAVENOUS

## 2018-09-17 MED ORDER — LORAZEPAM 2 MG/ML IJ SOLN
1.0000 mg | Freq: Once | INTRAMUSCULAR | Status: AC
  Administered 2018-09-17: 1 mg via INTRAVENOUS
  Filled 2018-09-17: qty 1

## 2018-09-17 MED ORDER — ONDANSETRON HCL 4 MG/2ML IJ SOLN
4.0000 mg | Freq: Once | INTRAMUSCULAR | Status: AC
  Administered 2018-09-17: 4 mg via INTRAVENOUS

## 2018-09-17 MED ORDER — ONDANSETRON HCL 4 MG/2ML IJ SOLN
INTRAMUSCULAR | Status: AC
  Filled 2018-09-17: qty 2

## 2018-09-17 MED ORDER — INSULIN REGULAR(HUMAN) IN NACL 100-0.9 UT/100ML-% IV SOLN
INTRAVENOUS | Status: DC
  Administered 2018-09-17: 2.8 [IU]/h via INTRAVENOUS
  Filled 2018-09-17: qty 100

## 2018-09-17 MED ORDER — PROMETHAZINE HCL 25 MG/ML IJ SOLN
25.0000 mg | Freq: Once | INTRAMUSCULAR | Status: AC
  Administered 2018-09-17: 25 mg via INTRAVENOUS
  Filled 2018-09-17: qty 1

## 2018-09-17 NOTE — ED Notes (Signed)
Dr Haviland informed of EG7 results 

## 2018-09-17 NOTE — ED Triage Notes (Signed)
Patient with nausea and vomiting, she has a history of DM type 1.  She is cold to the touch, clammy.  CBG of 427 in triage.

## 2018-09-17 NOTE — ED Provider Notes (Addendum)
[facility] EMERGENCY DEPARTMENT Provider Note   CSN: [id number] Arrival date & time: 09/17/18  2000    History   Chief Complaint Chief Complaint  Patient presents with  . Hyperglycemia    HPI [name] is a 28 y.o. female.     Pt presents to the ED today with elevated blood sugar and n/v.  Pt has a hx of DM1 and diabetic gastroparesis.  The pt said she has not taken her insulin since yesterday.  The pt was here on 6/3 for the same, but signed out AMA.  Pt is a poor historian.     Past Medical History:  Diagnosis Date  . Allergy   . Anemia   . Anxiety   . Blood transfusion without reported diagnosis    Dec 2018  . Cataract    right eye  . Depression   . Diabetes type 1, uncontrolled ([place]) 11/14/2011   Since age 20  . Fibromyalgia   . Gastroparesis   . GERD (gastroesophageal reflux disease)   . Hypertension   . Infection    UTI April 2016    Patient Active Problem List   Diagnosis Date Noted  . Pneumonia 08/20/2018  . Contraception management 08/06/2018  . Intractable vomiting with nausea 07/30/2018  . Cyclical vomiting syndrome 06/07/2018  . Diabetic gastroparesis associated with type 1 diabetes mellitus ([place]) 06/04/2018  . Anxiety 05/31/2018  . Peripheral edema 05/31/2018  . Normocytic anemia 07/10/2017  . GERD (gastroesophageal reflux disease) 06/23/2017  . 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 Diabetes mellitus type 1 ([place]) 04/28/2013  . Protein-calorie malnutrition, severe ([place]) 04/28/2013  . Hypertension associated with diabetes ([place]) 11/14/2011  . Gastroparesis 11/14/2011  . Chronic abdominal pain 09/18/2011    Past Surgical History:  Procedure Laterality Date  . ANKLE SURGERY    . CHOLECYSTECTOMY  11/15/2011   Procedure: LAPAROSCOPIC CHOLECYSTECTOMY WITH INTRAOPERATIVE CHOLANGIOGRAM;  Surgeon: [name], MD;  Location: [facility];  Service: General;  Laterality: N/A;  .  COLONOSCOPY    . COLONOSCOPY WITH PROPOFOL N/A 06/27/2017   Procedure: COLONOSCOPY WITH PROPOFOL;  Surgeon: Milus Banister, MD;  Location: [facility];  Service: Endoscopy;  Laterality: N/A;  . ESOPHAGOGASTRODUODENOSCOPY  12/03/2011   Procedure: ESOPHAGOGASTRODUODENOSCOPY (EGD);  Surgeon: Beryle Beams, MD;  Location: [facility];  Service: Endoscopy;  Laterality: N/A;  . FLEXIBLE SIGMOIDOSCOPY N/A 03/10/2017   Procedure: FLEXIBLE SIGMOIDOSCOPY;  Surgeon: Carol Ada, MD;  Location: [facility];  Service: Endoscopy;  Laterality: N/A;  . INCISION AND DRAINAGE PERIRECTAL ABSCESS N/A 03/01/2017   Procedure: IRRIGATION AND DEBRIDEMENT PERIRECTAL ABSCESS;  Surgeon: Alphonsa Overall, MD;  Location: [facility];  Service: General;  Laterality: N/A;  . IRRIGATION AND DEBRIDEMENT BUTTOCKS N/A 03/23/2017   Procedure: IRRIGATION AND DEBRIDEMENT BUTTOCKS, SETON PLACEMENT;  Surgeon: [name], MD;  Location: [facility];  Service: General;  Laterality: N/A;  . LAPAROSCOPY  11/23/2011   Procedure: LAPAROSCOPY DIAGNOSTIC;  Surgeon: Edward Jolly, MD;  Location: [facility];  Service: General;  Laterality: N/A;  . SIGMOIDOSCOPY    . UPPER GASTROINTESTINAL ENDOSCOPY    . WISDOM TOOTH EXTRACTION       OB History    Gravida  1   Para  1   Term      Preterm  1   AB      Living  1     SAB      TAB  Ectopic      Multiple  0   Live Births  1            Home Medications    Prior to Admission medications   Medication Sig Start Date End Date Taking? Authorizing Provider  benzonatate (TESSALON PERLES) 100 MG capsule Take 1 capsule (100 mg total) by mouth 3 (three) times daily as needed. Patient taking differently: Take 100 mg by mouth 3 (three) times daily as needed for cough.  08/13/18   Hassell Done Mary-Margaret, FNP  Continuous Blood Gluc Sensor (FREESTYLE LIBRE 14 DAY SENSOR) MISC 1 Device by Does not apply route every 14 (fourteen) days. 06/22/18   [name], MD  diclofenac  (VOLTAREN) 75 MG EC tablet Take 1 tablet (75 mg total) by mouth 2 (two) times daily. Patient taking differently: Take 75 mg by mouth daily.  08/06/18   [name], MD  dicyclomine (BENTYL) 20 MG tablet Take 1 tablet (20 mg total) by mouth 3 (three) times daily as needed for spasms (abdominal cramping). 06/03/18   Long, Wonda Olds, MD  furosemide (LASIX) 40 MG tablet Take 1 tablet (40 mg total) by mouth daily. 07/20/18   Armbruster, Carlota Raspberry, MD  insulin lispro (HUMALOG) 100 UNIT/ML injection Inject 0.04 mLs (4 Units total) into the skin 3 (three) times daily with meals. Patient taking differently: Inject 0-10 Units into the skin See admin instructions. Sliding scale 06/18/18   Renato Shin, MD  LANTUS 100 UNIT/ML injection Inject 0.05 mLs (5 Units total) into the skin at bedtime. Patient using Vials Patient taking differently: Inject 8 Units into the skin at bedtime. Patient using Vials 06/18/18   Renato Shin, MD  lidocaine (LIDODERM) 5 % Place 2 patches onto the skin daily. Remove & Discard patch within 12 hours or as directed by MD 08/03/18   Debbe Odea, MD  LORazepam (ATIVAN) 0.5 MG tablet TAKE 1 TABLET BY MOUTH  TWICE A DAY AS NEEDED FOR  ANXIETY Patient taking differently: Take 0.5 mg by mouth 2 (two) times daily as needed for anxiety.  08/19/18   [name], MD  metoCLOPramide (REGLAN) 10 MG tablet Take 1 tablet (10 mg total) by mouth every 6 (six) hours as needed for nausea or vomiting. 02/20/18   Shelly Coss, MD  mirtazapine (REMERON) 30 MG tablet Take 1 tablet (30 mg total) by mouth at bedtime. 05/31/18   [name], MD  ondansetron (ZOFRAN) 4 MG tablet Take 1 tablet (4 mg total) by mouth every 8 (eight) hours as needed for nausea or vomiting (If necessary, dont combine with Reglan.). 03/25/18   Amin, Jeanella Flattery, MD  pantoprazole (PROTONIX) 40 MG tablet Take 1 tablet (40 mg total) by mouth 2 (two) times daily. 06/10/18   Raiford Noble Latif, DO  promethazine (PHENERGAN) 12.5 MG  suppository Place 1 suppository (12.5 mg total) rectally every 8 (eight) hours as needed for nausea or vomiting. Note: Do not take while taking oral phenergan 07/27/18   Armbruster, Carlota Raspberry, MD  promethazine (PHENERGAN) 12.5 MG tablet TAKE 1 TABLET BY MOUTH  EVERY 8 HOURS AS NEEDED FOR NAUSEA AND VOMITING Patient taking differently: Take 12.5 mg by mouth every 8 (eight) hours as needed for nausea or vomiting.  08/11/18   Armbruster, Carlota Raspberry, MD    Family History Family History  Problem Relation Age of Onset  . Diabetes Mother   . Hypertension Father   . Colon cancer Paternal Grandmother  pt thinks PGM was dx in her 22's  . Diabetes Paternal Grandmother   . Diabetes Maternal Grandmother   . Diabetes Maternal Grandfather   . Diabetes Paternal Grandfather   . Diabetes Other   . Esophageal cancer Neg Hx   . Liver cancer Neg Hx   . Pancreatic cancer Neg Hx   . Stomach cancer Neg Hx   . Rectal cancer Neg Hx     Social History Social History   Tobacco Use  . Smoking status: Never Smoker  . Smokeless tobacco: Never Used  Substance Use Topics  . Alcohol use: No    Alcohol/week: 0.0 standard drinks    Frequency: Never  . Drug use: No     Allergies   Other and Lactose intolerance (gi)   Review of Systems Review of Systems  Gastrointestinal: Positive for nausea and vomiting.  All other systems reviewed and are negative.    Physical Exam Updated Vital Signs BP (!) 187/108   Pulse (!) 108   Temp (!) 96.2 F (35.7 C) (Temporal)   Resp 16   Ht 5' 7"  (1.702 m)   Wt 59 kg   LMP 08/19/2018 Comment: pt shielded  SpO2 100%   BMI 20.36 kg/m   Physical Exam Vitals signs and nursing note reviewed.  Constitutional:      Appearance: She is ill-appearing.  HENT:     Head: Normocephalic and atraumatic.     Right Ear: External ear normal.     Left Ear: External ear normal.     Nose: Nose normal.     Mouth/Throat:     Mouth: Mucous membranes are dry.     Pharynx:  Oropharynx is clear.  Eyes:     Extraocular Movements: Extraocular movements intact.     Conjunctiva/sclera: Conjunctivae normal.     Pupils: Pupils are equal, round, and reactive to light.  Neck:     Musculoskeletal: Normal range of motion and neck supple.  Cardiovascular:     Rate and Rhythm: Regular rhythm. Tachycardia present.     Pulses: Normal pulses.     Heart sounds: Normal heart sounds.  Pulmonary:     Effort: Pulmonary effort is normal.     Breath sounds: Normal breath sounds.  Abdominal:     General: Abdomen is flat. Bowel sounds are normal.     Palpations: Abdomen is soft.  Musculoskeletal: Normal range of motion.  Skin:    General: Skin is dry.     Capillary Refill: Capillary refill takes 2 to 3 seconds.  Neurological:     General: No focal deficit present.     Mental Status: She is alert.  Psychiatric:        Mood and Affect: Mood is anxious.      ED Treatments / Results  Labs (all labs ordered are listed, but only abnormal results are displayed) Labs Reviewed  BASIC METABOLIC PANEL - Abnormal; Notable for the following components:      Result Value   Sodium 134 (*)    Chloride 97 (*)    Glucose, Bld 408 (*)    All other components within normal limits  CBC WITH DIFFERENTIAL/PLATELET - Abnormal; Notable for the following components:   Hemoglobin 11.3 (*)    HCT 34.4 (*)    All other components within normal limits  BETA-HYDROXYBUTYRIC ACID - Abnormal; Notable for the following components:   Beta-Hydroxybutyric Acid 1.86 (*)    All other components within normal limits  CBG MONITORING, ED -  Abnormal; Notable for the following components:   Glucose-Capillary 385 (*)    All other components within normal limits  POCT I-STAT EG7 - Abnormal; Notable for the following components:   pH, Ven 7.807 (*)    pCO2, Ven 16.7 (*)    pO2, Ven 163.0 (*)    Acid-Base Excess 10.0 (*)    Sodium 132 (*)    Calcium, Ion 0.92 (*)    HCT 33.0 (*)    Hemoglobin 11.2 (*)     All other components within normal limits  CBG MONITORING, ED - Abnormal; Notable for the following components:   Glucose-Capillary 359 (*)    All other components within normal limits  CBG MONITORING, ED - Abnormal; Notable for the following components:   Glucose-Capillary 338 (*)    All other components within normal limits  NOVEL CORONAVIRUS, NAA (HOSPITAL ORDER, SEND-OUT TO REF LAB)  URINALYSIS, ROUTINE W REFLEX MICROSCOPIC  I-STAT BETA HCG BLOOD, ED ([facility], [facility], [facility] ONLY)  I-STAT VENOUS BLOOD GAS, ED  CBG MONITORING, ED    EKG None  Radiology Dg Chest Portable 1 View  Result Date: 09/17/2018 CLINICAL DATA:  DKA. Nausea and vomiting. EXAM: PORTABLE CHEST 1 VIEW COMPARISON:  09/01/2018 FINDINGS: The cardiomediastinal contours are normal. Unchanged patchy right infrahilar opacity, which on prior frontal and lateral views localized to the right middle lobe. No new airspace disease. Pulmonary vasculature is normal. No pleural effusion or pneumothorax. No acute osseous abnormalities are seen. IMPRESSION: Patchy right infrahilar opacity is unchanged from radiographs earlier this month. No new abnormality. Electronically Signed   By: Keith Rake M.D.   On: 09/17/2018 20:40    Procedures Procedures (including critical care time)  Medications Ordered in ED Medications  insulin regular, human (MYXREDLIN) 100 units/ 100 mL infusion (2.8 Units/hr Intravenous New Bag/Given 09/17/18 2250)  sodium chloride 0.9 % bolus 1,000 mL (1,000 mLs Intravenous New Bag/Given 09/17/18 2141)    And  0.9 %  sodium chloride infusion ( Intravenous New Bag/Given 09/17/18 2257)  ondansetron (ZOFRAN) injection 4 mg (4 mg Intravenous Given 09/17/18 2009)  sodium chloride 0.9 % bolus 1,000 mL (0 mLs Intravenous Stopped 09/17/18 2204)  LORazepam (ATIVAN) injection 1 mg (1 mg Intravenous Given 09/17/18 2033)  morphine 4 MG/ML injection 4 mg (4 mg Intravenous Given 09/17/18 2220)  promethazine (PHENERGAN) injection 25  mg (25 mg Intravenous Given 09/17/18 2223)     Initial Impression / Assessment and Plan / ED Course  I have reviewed the triage vital signs and the nursing notes.  Pertinent labs & imaging results that were available during my care of the patient were reviewed by me and considered in my medical decision making (see chart for details).     Pt was given IVFs and an insulin drip was started.  The pt is not in DKA.    CXR shows a chronic infiltrate.  Nothing acute.  She is afebrile and is not hypoxic.  Pt will be signed out to Dr. Tyrone Nine at shift change.    CRITICAL CARE Performed by: Isla Pence   Total critical care time: 30 minutes  Critical care time was exclusive of separately billable procedures and treating other patients.  Critical care was necessary to treat or prevent imminent or life-threatening deterioration.  Critical care was time spent personally by me on the following activities: development of treatment plan with patient and/or surrogate as well as nursing, discussions with consultants, evaluation of patient's response to treatment, examination of patient, obtaining history  from patient or surrogate, ordering and performing treatments and interventions, ordering and review of laboratory studies, ordering and review of radiographic studies, pulse oximetry and re-evaluation of patient's condition.  Final Clinical Impressions(s) / ED Diagnoses   Final diagnoses:  Hyperglycemia  Diabetic gastroparesis ([place])  Dehydration  Noncompliance with medication regimen    ED Discharge Orders    None       Isla Pence, MD 09/17/18 2315    Isla Pence, MD 09/27/18 [phone]

## 2018-09-18 ENCOUNTER — Inpatient Hospital Stay (HOSPITAL_COMMUNITY)
Admission: EM | Admit: 2018-09-18 | Discharge: 2018-09-21 | DRG: 638 | Disposition: A | Discharge status: Home / Self Care | Payer: 59 | Attending: Internal Medicine | Admitting: Internal Medicine

## 2018-09-18 ENCOUNTER — Encounter (HOSPITAL_COMMUNITY): Payer: Self-pay | Admitting: Family Medicine

## 2018-09-18 ENCOUNTER — Emergency Department (HOSPITAL_COMMUNITY): Payer: 59

## 2018-09-18 DIAGNOSIS — Z8249 Family history of ischemic heart disease and other diseases of the circulatory system: Secondary | ICD-10-CM

## 2018-09-18 DIAGNOSIS — N179 Acute kidney failure, unspecified: Secondary | ICD-10-CM

## 2018-09-18 DIAGNOSIS — G8929 Other chronic pain: Secondary | ICD-10-CM | POA: Diagnosis present

## 2018-09-18 DIAGNOSIS — Z79899 Other long term (current) drug therapy: Secondary | ICD-10-CM

## 2018-09-18 DIAGNOSIS — Z1159 Encounter for screening for other viral diseases: Secondary | ICD-10-CM

## 2018-09-18 DIAGNOSIS — E860 Dehydration: Secondary | ICD-10-CM | POA: Diagnosis present

## 2018-09-18 DIAGNOSIS — M797 Fibromyalgia: Secondary | ICD-10-CM | POA: Diagnosis present

## 2018-09-18 DIAGNOSIS — E1159 Type 2 diabetes mellitus with other circulatory complications: Secondary | ICD-10-CM | POA: Diagnosis present

## 2018-09-18 DIAGNOSIS — F329 Major depressive disorder, single episode, unspecified: Secondary | ICD-10-CM | POA: Diagnosis present

## 2018-09-18 DIAGNOSIS — E1010 Type 1 diabetes mellitus with ketoacidosis without coma: Secondary | ICD-10-CM | POA: Diagnosis not present

## 2018-09-18 DIAGNOSIS — Z833 Family history of diabetes mellitus: Secondary | ICD-10-CM

## 2018-09-18 DIAGNOSIS — K219 Gastro-esophageal reflux disease without esophagitis: Secondary | ICD-10-CM | POA: Diagnosis present

## 2018-09-18 DIAGNOSIS — K760 Fatty (change of) liver, not elsewhere classified: Secondary | ICD-10-CM | POA: Diagnosis present

## 2018-09-18 DIAGNOSIS — F419 Anxiety disorder, unspecified: Secondary | ICD-10-CM | POA: Diagnosis present

## 2018-09-18 DIAGNOSIS — E1069 Type 1 diabetes mellitus with other specified complication: Secondary | ICD-10-CM | POA: Diagnosis present

## 2018-09-18 DIAGNOSIS — F330 Major depressive disorder, recurrent, mild: Secondary | ICD-10-CM

## 2018-09-18 DIAGNOSIS — Z91011 Allergy to milk products: Secondary | ICD-10-CM

## 2018-09-18 DIAGNOSIS — R112 Nausea with vomiting, unspecified: Secondary | ICD-10-CM

## 2018-09-18 DIAGNOSIS — I152 Hypertension secondary to endocrine disorders: Secondary | ICD-10-CM | POA: Diagnosis present

## 2018-09-18 DIAGNOSIS — Z888 Allergy status to other drugs, medicaments and biological substances status: Secondary | ICD-10-CM

## 2018-09-18 DIAGNOSIS — R109 Unspecified abdominal pain: Secondary | ICD-10-CM | POA: Diagnosis not present

## 2018-09-18 DIAGNOSIS — E1043 Type 1 diabetes mellitus with diabetic autonomic (poly)neuropathy: Secondary | ICD-10-CM | POA: Diagnosis not present

## 2018-09-18 DIAGNOSIS — E876 Hypokalemia: Secondary | ICD-10-CM | POA: Diagnosis present

## 2018-09-18 DIAGNOSIS — K529 Noninfective gastroenteritis and colitis, unspecified: Secondary | ICD-10-CM | POA: Diagnosis present

## 2018-09-18 DIAGNOSIS — R1110 Vomiting, unspecified: Secondary | ICD-10-CM

## 2018-09-18 DIAGNOSIS — F1290 Cannabis use, unspecified, uncomplicated: Secondary | ICD-10-CM | POA: Diagnosis present

## 2018-09-18 DIAGNOSIS — Z765 Malingerer [conscious simulation]: Secondary | ICD-10-CM

## 2018-09-18 DIAGNOSIS — K3184 Gastroparesis: Secondary | ICD-10-CM | POA: Diagnosis present

## 2018-09-18 DIAGNOSIS — Z9049 Acquired absence of other specified parts of digestive tract: Secondary | ICD-10-CM

## 2018-09-18 DIAGNOSIS — Z794 Long term (current) use of insulin: Secondary | ICD-10-CM

## 2018-09-18 LAB — CBC WITH DIFFERENTIAL/PLATELET
Abs Immature Granulocytes: 0.03 10*3/uL (ref 0.00–0.07)
Basophils Absolute: 0 10*3/uL (ref 0.0–0.1)
Basophils Relative: 0 %
Eosinophils Absolute: 0 10*3/uL (ref 0.0–0.5)
Eosinophils Relative: 0 %
HCT: 34.1 % — ABNORMAL LOW (ref 36.0–46.0)
Hemoglobin: 11.1 g/dL — ABNORMAL LOW (ref 12.0–15.0)
Immature Granulocytes: 1 %
Lymphocytes Relative: 16 %
Lymphs Abs: 1 10*3/uL (ref 0.7–4.0)
MCH: 27.5 pg (ref 26.0–34.0)
MCHC: 32.6 g/dL (ref 30.0–36.0)
MCV: 84.4 fL (ref 80.0–100.0)
Monocytes Absolute: 0.2 10*3/uL (ref 0.1–1.0)
Monocytes Relative: 4 %
Neutro Abs: 5.1 10*3/uL (ref 1.7–7.7)
Neutrophils Relative %: 79 %
Platelets: 226 10*3/uL (ref 150–400)
RBC: 4.04 MIL/uL (ref 3.87–5.11)
RDW: 13.1 % (ref 11.5–15.5)
WBC: 6.4 10*3/uL (ref 4.0–10.5)
nRBC: 0 % (ref 0.0–0.2)

## 2018-09-18 LAB — BETA-HYDROXYBUTYRIC ACID: Beta-Hydroxybutyric Acid: 7.25 mmol/L — ABNORMAL HIGH (ref 0.05–0.27)

## 2018-09-18 LAB — BASIC METABOLIC PANEL
Anion gap: 22 — ABNORMAL HIGH (ref 5–15)
Anion gap: 15 (ref 5–15)
BUN: 27 mg/dL — ABNORMAL HIGH (ref 6–20)
BUN: 25 mg/dL — ABNORMAL HIGH (ref 6–20)
CO2: 16 mmol/L — ABNORMAL LOW (ref 22–32)
CO2: 20 mmol/L — ABNORMAL LOW (ref 22–32)
Calcium: 9.7 mg/dL (ref 8.9–10.3)
Calcium: 8.9 mg/dL (ref 8.9–10.3)
Chloride: 99 mmol/L (ref 98–111)
Chloride: 105 mmol/L (ref 98–111)
Creatinine, Ser: 1.46 mg/dL — ABNORMAL HIGH (ref 0.44–1.00)
Creatinine, Ser: 1.29 mg/dL — ABNORMAL HIGH (ref 0.44–1.00)
GFR calc Af Amer: 56 mL/min — ABNORMAL LOW (ref >60)
GFR calc Af Amer: >60 mL/min (ref >60)
GFR calc non Af Amer: 48 mL/min — ABNORMAL LOW (ref >60)
GFR calc non Af Amer: 56 mL/min — ABNORMAL LOW (ref >60)
Glucose, Bld: 420 mg/dL — ABNORMAL HIGH (ref 70–99)
Glucose, Bld: 239 mg/dL — ABNORMAL HIGH (ref 70–99)
Potassium: 4 mmol/L (ref 3.5–5.1)
Potassium: 3.8 mmol/L (ref 3.5–5.1)
Sodium: 137 mmol/L (ref 135–145)
Sodium: 140 mmol/L (ref 135–145)

## 2018-09-18 LAB — URINALYSIS, ROUTINE W REFLEX MICROSCOPIC
Bilirubin Urine: NEGATIVE
Glucose, UA: >=500 mg/dL — AB
Ketones, ur: 20 mg/dL — AB
Leukocytes,Ua: NEGATIVE
Nitrite: NEGATIVE
Protein, ur: >=300 mg/dL — AB
Specific Gravity, Urine: 1.015 (ref 1.005–1.030)
pH: 6 (ref 5.0–8.0)

## 2018-09-18 LAB — GLUCOSE, CAPILLARY
Glucose-Capillary: 113 mg/dL — ABNORMAL HIGH (ref 70–99)
Glucose-Capillary: 219 mg/dL — ABNORMAL HIGH (ref 70–99)
Glucose-Capillary: 22 mg/dL — CL (ref 70–99)

## 2018-09-18 LAB — CBG MONITORING, ED
Glucose-Capillary: 193 mg/dL — ABNORMAL HIGH (ref 70–99)
Glucose-Capillary: 238 mg/dL — ABNORMAL HIGH (ref 70–99)
Glucose-Capillary: 344 mg/dL — ABNORMAL HIGH (ref 70–99)
Glucose-Capillary: 381 mg/dL — ABNORMAL HIGH (ref 70–99)
Glucose-Capillary: 397 mg/dL — ABNORMAL HIGH (ref 70–99)

## 2018-09-18 LAB — I-STAT BETA HCG BLOOD, ED (MC, WL, AP ONLY): I-stat hCG, quantitative: <5 m[IU]/mL (ref <5)

## 2018-09-18 LAB — SARS CORONAVIRUS 2 BY RT PCR (HOSPITAL ORDER, PERFORMED IN ~~LOC~~ HOSPITAL LAB): SARS Coronavirus 2: NEGATIVE

## 2018-09-18 IMAGING — DX PORTABLE CHEST - 1 VIEW
1 series · 1 of 1 positions shown · non-contrast
Comparison: [DATE], [DATE]

CLINICAL DATA: Chest pain, vomiting

EXAM:
PORTABLE CHEST 1 VIEW

[chest ap]
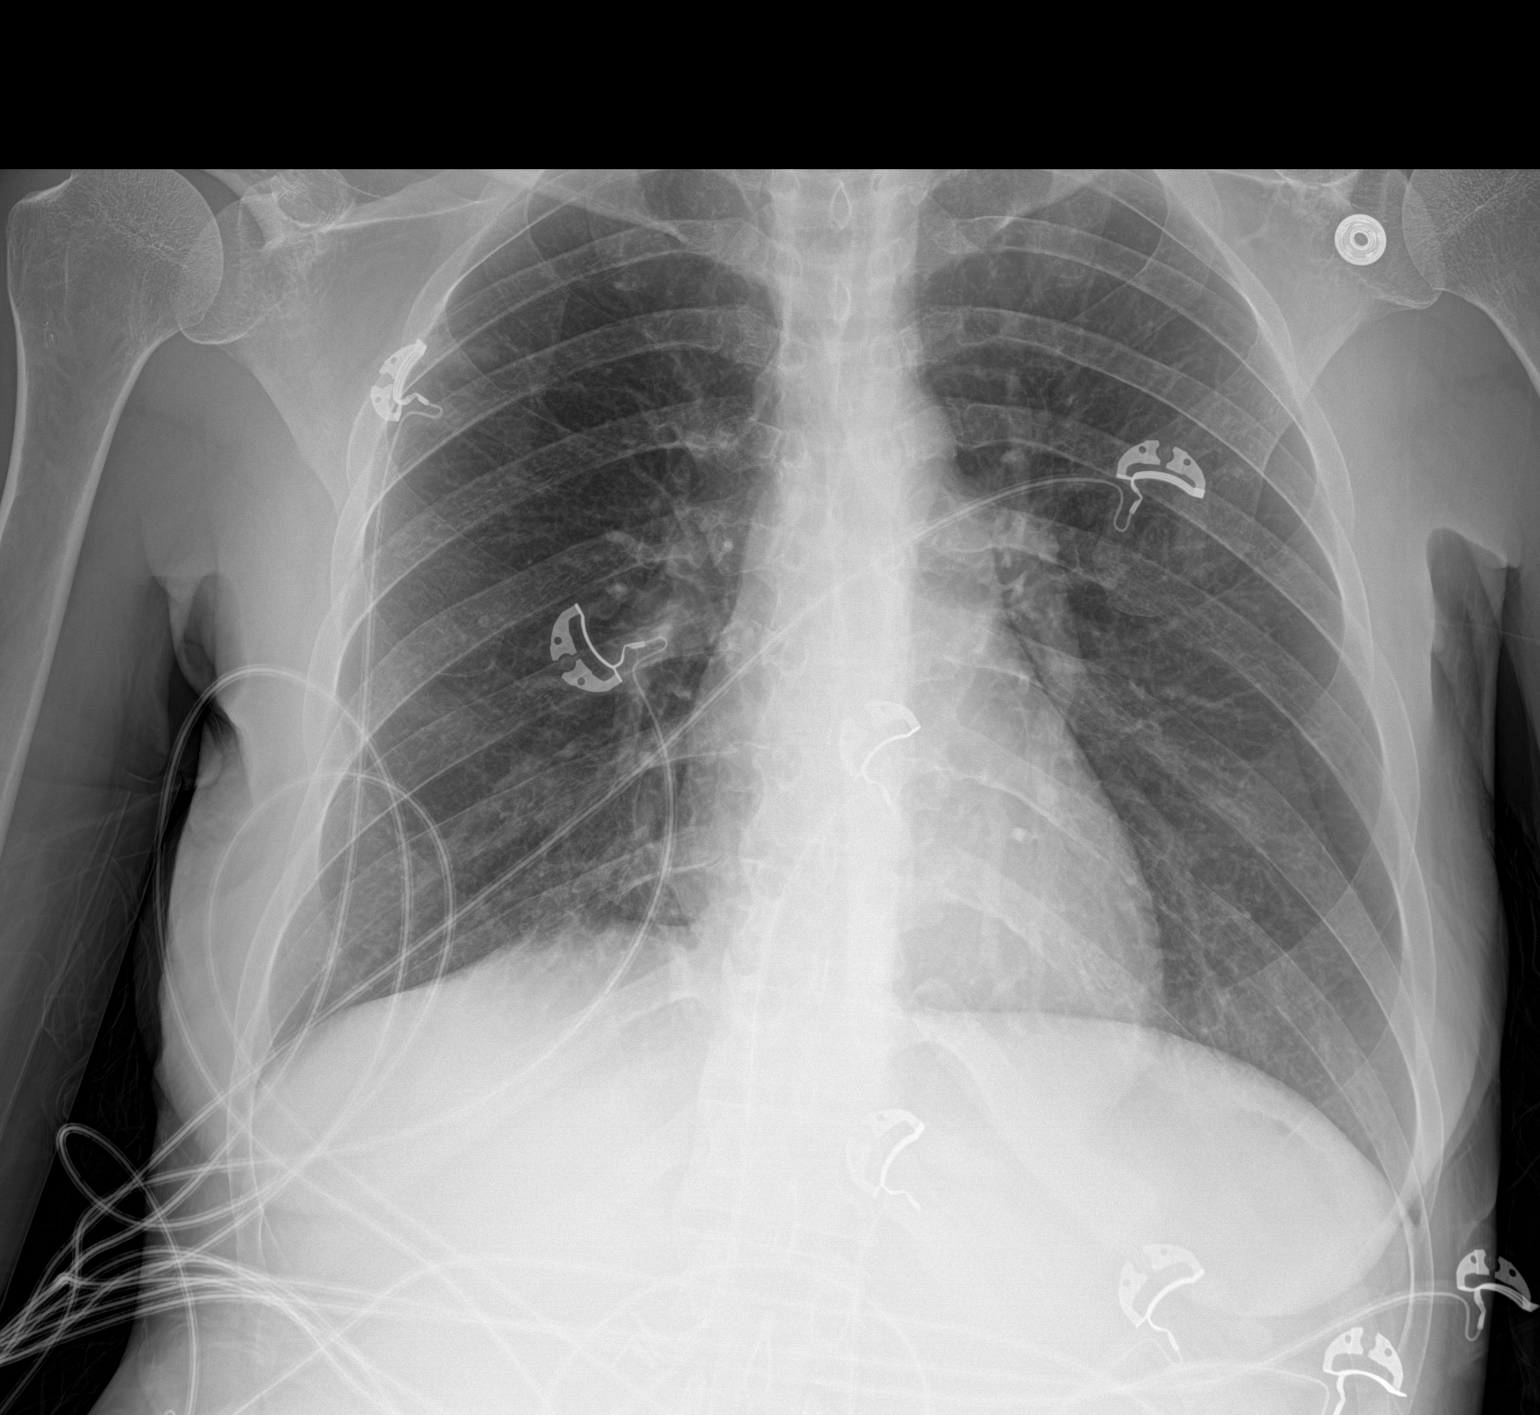

[1 of 1 positions shown; findings below may reference images not displayed]

FINDINGS: The heart size and mediastinal contours are within normal limits.
Both lungs are clear. The visualized skeletal structures are
unremarkable.
IMPRESSION: No active disease.

## 2018-09-18 MED ORDER — DEXTROSE-NACL 5-0.45 % IV SOLN
INTRAVENOUS | Status: DC
  Administered 2018-09-18 – 2018-09-19 (×2): via INTRAVENOUS

## 2018-09-18 MED ORDER — PANTOPRAZOLE SODIUM 40 MG PO TBEC
40.0000 mg | DELAYED_RELEASE_TABLET | Freq: Two times a day (BID) | ORAL | Status: DC
  Administered 2018-09-19 – 2018-09-21 (×5): 40 mg via ORAL
  Filled 2018-09-18 (×5): qty 1

## 2018-09-18 MED ORDER — METOCLOPRAMIDE HCL 5 MG/ML IJ SOLN
10.0000 mg | Freq: Once | INTRAMUSCULAR | Status: AC
  Administered 2018-09-18: 10 mg via INTRAVENOUS
  Filled 2018-09-18: qty 2

## 2018-09-18 MED ORDER — ACETAMINOPHEN 325 MG PO TABS: 650.0000 mg | ORAL_TABLET | Freq: Four times a day (QID) | ORAL | Status: DC | PRN

## 2018-09-18 MED ORDER — SODIUM CHLORIDE 0.9 % IV BOLUS
1000.0000 mL | Freq: Once | INTRAVENOUS | Status: AC
  Administered 2018-09-18: 1000 mL via INTRAVENOUS

## 2018-09-18 MED ORDER — SODIUM CHLORIDE 0.9 % IV SOLN
INTRAVENOUS | Status: DC
  Administered 2018-09-18: 19:00:00 via INTRAVENOUS

## 2018-09-18 MED ORDER — POTASSIUM CHLORIDE 10 MEQ/100ML IV SOLN
10.0000 meq | INTRAVENOUS | Status: AC
  Administered 2018-09-18 (×2): 10 meq via INTRAVENOUS
  Filled 2018-09-18 (×2): qty 100

## 2018-09-18 MED ORDER — ONDANSETRON HCL 4 MG/2ML IJ SOLN
4.0000 mg | Freq: Four times a day (QID) | INTRAMUSCULAR | Status: DC | PRN
  Administered 2018-09-19: 4 mg via INTRAVENOUS
  Filled 2018-09-18 (×2): qty 2

## 2018-09-18 MED ORDER — SODIUM CHLORIDE 0.9% FLUSH
3.0000 mL | Freq: Two times a day (BID) | INTRAVENOUS | Status: DC
  Administered 2018-09-18 – 2018-09-20 (×4): 3 mL via INTRAVENOUS

## 2018-09-18 MED ORDER — ONDANSETRON HCL 4 MG PO TABS: 4.0000 mg | ORAL_TABLET | Freq: Four times a day (QID) | ORAL | Status: DC | PRN

## 2018-09-18 MED ORDER — INSULIN REGULAR(HUMAN) IN NACL 100-0.9 UT/100ML-% IV SOLN
INTRAVENOUS | Status: DC
  Administered 2018-09-18: 3.4 [IU]/h via INTRAVENOUS
  Filled 2018-09-18: qty 100

## 2018-09-18 MED ORDER — ALUM & MAG HYDROXIDE-SIMETH 200-200-20 MG/5ML PO SUSP
30.0000 mL | Freq: Once | ORAL | Status: AC
  Administered 2018-09-18: 30 mL via ORAL
  Filled 2018-09-18: qty 30

## 2018-09-18 MED ORDER — PROMETHAZINE HCL 25 MG/ML IJ SOLN
12.5000 mg | Freq: Four times a day (QID) | INTRAMUSCULAR | Status: DC | PRN
  Administered 2018-09-18: 12.5 mg via INTRAVENOUS
  Filled 2018-09-18: qty 1

## 2018-09-18 MED ORDER — ACETAMINOPHEN 650 MG RE SUPP: 650.0000 mg | Freq: Four times a day (QID) | RECTAL | Status: DC | PRN

## 2018-09-18 MED ORDER — FENTANYL CITRATE (PF) 100 MCG/2ML IJ SOLN
50.0000 ug | Freq: Once | INTRAMUSCULAR | Status: AC
  Administered 2018-09-18: 50 ug via INTRAVENOUS
  Filled 2018-09-18: qty 2

## 2018-09-18 MED ORDER — METOCLOPRAMIDE HCL 5 MG/ML IJ SOLN
10.0000 mg | Freq: Three times a day (TID) | INTRAMUSCULAR | Status: DC
  Administered 2018-09-19 – 2018-09-21 (×8): 10 mg via INTRAVENOUS
  Filled 2018-09-18 (×8): qty 2

## 2018-09-18 MED ORDER — LORAZEPAM 1 MG PO TABS: 0.5000 mg | ORAL_TABLET | Freq: Two times a day (BID) | ORAL | Status: DC | PRN

## 2018-09-18 MED ORDER — DEXTROSE 50 % IV SOLN
INTRAVENOUS | Status: AC
  Administered 2018-09-19: 50 mL via INTRAVENOUS
  Filled 2018-09-18: qty 50

## 2018-09-18 MED ORDER — MIRTAZAPINE 15 MG PO TABS
30.0000 mg | ORAL_TABLET | Freq: Every day | ORAL | Status: DC
  Administered 2018-09-18 – 2018-09-20 (×3): 30 mg via ORAL
  Filled 2018-09-18 (×3): qty 2

## 2018-09-18 MED ORDER — SODIUM CHLORIDE 0.9 % IV SOLN
INTRAVENOUS | Status: DC
  Administered 2018-09-18: 21:00:00 via INTRAVENOUS

## 2018-09-18 MED ORDER — DEXTROSE 50 % IV SOLN
25.0000 g | INTRAVENOUS | Status: AC
  Administered 2018-09-19: 50 mL via INTRAVENOUS
  Filled 2018-09-18: qty 50

## 2018-09-18 MED ORDER — ENOXAPARIN SODIUM 40 MG/0.4ML ~~LOC~~ SOLN
40.0000 mg | SUBCUTANEOUS | Status: DC
  Administered 2018-09-18 – 2018-09-19 (×2): 40 mg via SUBCUTANEOUS
  Filled 2018-09-18 (×2): qty 0.4

## 2018-09-18 MED ORDER — PROMETHAZINE HCL 12.5 MG RE SUPP: 12.5000 mg | Freq: Three times a day (TID) | RECTAL | 2 refills | Status: DC | PRN

## 2018-09-18 MED ORDER — LIDOCAINE 5 % EX PTCH: 2.0000 | MEDICATED_PATCH | CUTANEOUS | Status: DC

## 2018-09-18 MED ORDER — HYOSCYAMINE SULFATE 0.5 MG/ML IJ SOLN
0.5000 mg | Freq: Once | INTRAMUSCULAR | Status: AC
  Administered 2018-09-19: 0.5 mg via INTRAVENOUS
  Filled 2018-09-18: qty 1

## 2018-09-18 MED ORDER — FENTANYL CITRATE (PF) 100 MCG/2ML IJ SOLN
25.0000 ug | INTRAMUSCULAR | Status: DC | PRN
  Administered 2018-09-18: 25 ug via INTRAVENOUS
  Administered 2018-09-18 – 2018-09-19 (×5): 50 ug via INTRAVENOUS
  Filled 2018-09-18 (×6): qty 2

## 2018-09-18 MED ORDER — INSULIN REGULAR(HUMAN) IN NACL 100-0.9 UT/100ML-% IV SOLN: INTRAVENOUS | Status: DC

## 2018-09-18 NOTE — ED Notes (Signed)
Attempted report 

## 2018-09-18 NOTE — ED Provider Notes (Signed)
28 yo F with a significant past medical history of poorly controlled diabetes frequent episodes of diabetic ketoacidosis and gastroparesis and came with a chief complaint of hyperglycemia and vomiting.  I received the patient in signout from Dr. Gilford Raid.  Patient is tachycardic and hyperglycemic without diabetic ketoacidosis plan is for IV fluids and nausea medicine oral trial and reassess.  Patient is able to tolerate by mouth here in the ED.  Heart rate now is down below 100 on my exam.  Will discharge home.   Deno Etienne, DO 09/18/18 0130

## 2018-09-18 NOTE — ED Notes (Signed)
Pt provided with water for PO challenge. Pt unable to tolerate fluids without vomiting.

## 2018-09-18 NOTE — ED Notes (Signed)
Pt c/o upper back pain and asks for pain meds.

## 2018-09-18 NOTE — H&P (Signed)
History and Physical    [name] ION:[id number] DOB: 01-14-1991 DOA: 09/18/2018  PCP: [name], MD   Patient coming from: Home   Chief Complaint: nausea, vomiting, pain   HPI: [name] is a 28 y.o. female with medical history significant for type 1 diabetes mellitus, diabetic gastroparesis, chronic pain, depression, and GERD, now presenting to the emergency department for evaluation of abdominal pain, nausea, and vomiting.  Patient was seen in the emergency department for the same complaints, was hyperglycemic but not in DKA at the time, was able to tolerate oral intake after treatments in the emergency department and she returned home, but has unfortunately worsened again back at home and has been unable to tolerate anything by mouth today. She reports severe abdominal pain similar to her chronic pain with frequent flares. She denies fevers or chills. She has had some loose stools associated with this, but no melena or hematochezia. She denies chest pain per se, but reports some chest discomfort that she attributes to all of her vomiting. She denies cough or SOB.   ED Course: Upon arrival to the ED, patient is found to be afebrile, saturating well on room air, tachycardic to 120, and with stable blood pressure.  EKG features sinus tachycardia with rate 110.  Chest x-ray is negative for acute cardiopulmonary disease.  Chemistry panel notable for glucose of 120 with bicarbonate of 16, anion gap 22, and creatinine 1.46, up from 0.75 yesterday.  CBC is notable for mild normocytic anemia.  Patient was given 2 L normal saline, IV potassium, fentanyl, Reglan, and started on insulin infusion in the ED.  Hospitalists are asked to admit for DKA with abdominal pain and intractable nausea and vomiting.   Review of Systems:  All other systems reviewed and apart from HPI, are negative.  Past Medical History:  Diagnosis Date  . Allergy   . Anemia   . Anxiety   . Blood transfusion without  reported diagnosis    Dec 2018  . Cataract    right eye  . Depression   . Diabetes type 1, uncontrolled ([place]) 11/14/2011   Since age 4  . Fibromyalgia   . Gastroparesis   . GERD (gastroesophageal reflux disease)   . Hypertension   . Infection    UTI April 2016    Past Surgical History:  Procedure Laterality Date  . ANKLE SURGERY    . CHOLECYSTECTOMY  11/15/2011   Procedure: LAPAROSCOPIC CHOLECYSTECTOMY WITH INTRAOPERATIVE CHOLANGIOGRAM;  Surgeon: [name], MD;  Location: [facility];  Service: General;  Laterality: N/A;  . COLONOSCOPY    . COLONOSCOPY WITH PROPOFOL N/A 06/27/2017   Procedure: COLONOSCOPY WITH PROPOFOL;  Surgeon: Milus Banister, MD;  Location: [facility];  Service: Endoscopy;  Laterality: N/A;  . ESOPHAGOGASTRODUODENOSCOPY  12/03/2011   Procedure: ESOPHAGOGASTRODUODENOSCOPY (EGD);  Surgeon: Beryle Beams, MD;  Location: [facility];  Service: Endoscopy;  Laterality: N/A;  . FLEXIBLE SIGMOIDOSCOPY N/A 03/10/2017   Procedure: FLEXIBLE SIGMOIDOSCOPY;  Surgeon: Carol Ada, MD;  Location: [facility];  Service: Endoscopy;  Laterality: N/A;  . INCISION AND DRAINAGE PERIRECTAL ABSCESS N/A 03/01/2017   Procedure: IRRIGATION AND DEBRIDEMENT PERIRECTAL ABSCESS;  Surgeon: Alphonsa Overall, MD;  Location: [facility];  Service: General;  Laterality: N/A;  . IRRIGATION AND DEBRIDEMENT BUTTOCKS N/A 03/23/2017   Procedure: IRRIGATION AND DEBRIDEMENT BUTTOCKS, SETON PLACEMENT;  Surgeon: [name], MD;  Location: [facility];  Service: General;  Laterality: N/A;  . LAPAROSCOPY  11/23/2011   Procedure:  LAPAROSCOPY DIAGNOSTIC;  Surgeon: Edward Jolly, MD;  Location: [facility];  Service: General;  Laterality: N/A;  . SIGMOIDOSCOPY    . UPPER GASTROINTESTINAL ENDOSCOPY    . WISDOM TOOTH EXTRACTION       reports that she has never smoked. She has never used smokeless tobacco. She reports that she does not drink alcohol or use drugs.  Allergies  Allergen Reactions  . Other  Anaphylaxis    Reaction to [place] nuts   . Lactose Intolerance (Gi) Diarrhea    Family History  Problem Relation Age of Onset  . Diabetes Mother   . Hypertension Father   . Colon cancer Paternal Grandmother        pt thinks PGM was dx in her 56's  . Diabetes Paternal Grandmother   . Diabetes Maternal Grandmother   . Diabetes Maternal Grandfather   . Diabetes Paternal Grandfather   . Diabetes Other   . Esophageal cancer Neg Hx   . Liver cancer Neg Hx   . Pancreatic cancer Neg Hx   . Stomach cancer Neg Hx   . Rectal cancer Neg Hx      Prior to Admission medications   Medication Sig Start Date End Date Taking? Authorizing Provider  benzonatate (TESSALON PERLES) 100 MG capsule Take 1 capsule (100 mg total) by mouth 3 (three) times daily as needed. Patient taking differently: Take 100 mg by mouth 3 (three) times daily as needed for cough.  08/13/18   Hassell Done Mary-Margaret, FNP  Continuous Blood Gluc Sensor (FREESTYLE LIBRE 14 DAY SENSOR) MISC 1 Device by Does not apply route every 14 (fourteen) days. 06/22/18   [name], MD  diclofenac (VOLTAREN) 75 MG EC tablet Take 1 tablet (75 mg total) by mouth 2 (two) times daily. Patient taking differently: Take 75 mg by mouth daily.  08/06/18   [name], MD  dicyclomine (BENTYL) 20 MG tablet Take 1 tablet (20 mg total) by mouth 3 (three) times daily as needed for spasms (abdominal cramping). 06/03/18   Long, Wonda Olds, MD  furosemide (LASIX) 40 MG tablet Take 1 tablet (40 mg total) by mouth daily. 07/20/18   Armbruster, Carlota Raspberry, MD  insulin lispro (HUMALOG) 100 UNIT/ML injection Inject 0.04 mLs (4 Units total) into the skin 3 (three) times daily with meals. Patient taking differently: Inject 0-10 Units into the skin See admin instructions. Sliding scale 06/18/18   Renato Shin, MD  LANTUS 100 UNIT/ML injection Inject 0.05 mLs (5 Units total) into the skin at bedtime. Patient using Vials Patient taking differently: Inject 8 Units into the  skin at bedtime. Patient using Vials 06/18/18   Renato Shin, MD  lidocaine (LIDODERM) 5 % Place 2 patches onto the skin daily. Remove & Discard patch within 12 hours or as directed by MD 08/03/18   Debbe Odea, MD  LORazepam (ATIVAN) 0.5 MG tablet TAKE 1 TABLET BY MOUTH  TWICE A DAY AS NEEDED FOR  ANXIETY Patient taking differently: Take 0.5 mg by mouth 2 (two) times daily as needed for anxiety.  08/19/18   [name], MD  metoCLOPramide (REGLAN) 10 MG tablet Take 1 tablet (10 mg total) by mouth every 6 (six) hours as needed for nausea or vomiting. 02/20/18   Shelly Coss, MD  mirtazapine (REMERON) 30 MG tablet Take 1 tablet (30 mg total) by mouth at bedtime. 05/31/18   [name], MD  ondansetron (ZOFRAN) 4 MG tablet Take 1 tablet (4 mg total) by mouth every 8 (  eight) hours as needed for nausea or vomiting (If necessary, dont combine with Reglan.). 03/25/18   Amin, Jeanella Flattery, MD  pantoprazole (PROTONIX) 40 MG tablet Take 1 tablet (40 mg total) by mouth 2 (two) times daily. 06/10/18   Raiford Noble Latif, DO  promethazine (PHENERGAN) 12.5 MG suppository Place 1 suppository (12.5 mg total) rectally every 8 (eight) hours as needed for nausea or vomiting. Note: Do not take while taking oral phenergan 09/18/18   Deno Etienne, DO  promethazine (PHENERGAN) 12.5 MG tablet TAKE 1 TABLET BY MOUTH  EVERY 8 HOURS AS NEEDED FOR NAUSEA AND VOMITING Patient taking differently: Take 12.5 mg by mouth every 8 (eight) hours as needed for nausea or vomiting.  08/11/18   Yetta Flock, MD    Physical Exam: Vitals:   09/18/18 1730 09/18/18 1745 09/18/18 1815 09/18/18 1845  BP: (!) 170/100 (!) 168/110 (!) 167/99 132/83  Pulse: (!) 113 (!) 114 (!) 114 (!) 119  Resp: 16 18 16 16   Temp:      TempSrc:      SpO2: 100% 100% 100% 98%  Weight:      Height:        Constitutional: NAD, appears uncomfortable  Eyes: PERTLA, lids and conjunctivae normal ENMT: Mucous membranes are moist. Posterior pharynx  clear of any exudate or lesions.   Neck: normal, supple, no masses, no thyromegaly Respiratory: clear to auscultation bilaterally, no wheezing, no crackles. No accessory muscle use.  Cardiovascular: Rate ~120 and regular. Mild LE edema bilaterally.  Abdomen: No distension, soft, generally tender, no rebound pain or guarding. Bowel sounds active.  Musculoskeletal: no clubbing / cyanosis. No joint deformity upper and lower extremities.    Skin: no significant rashes, lesions, ulcers. Warm, dry, well-perfused. Neurologic: CN 2-12 grossly intact. Strength 5/5 in all 4 limbs.  Psychiatric: Alert and oriented x 3. Cooperative.    Labs on Admission: I have personally reviewed following labs and imaging studies  CBC: Recent Labs  Lab 09/17/18 2014 09/17/18 2033 09/18/18 1748  WBC 4.9  --  6.4  NEUTROABS 2.9  --  5.1  HGB 11.3* 11.2* 11.1*  HCT 34.4* 33.0* 34.1*  MCV 83.7  --  84.4  PLT 191  --  683   Basic Metabolic Panel: Recent Labs  Lab 09/17/18 2014 09/17/18 2033 09/18/18 1748  NA 134* 132* 137  K 4.7 4.5 4.0  CL 97*  --  99  CO2 22  --  16*  GLUCOSE 408*  --  420*  BUN 11  --  27*  CREATININE 0.75  --  1.46*  CALCIUM 10.1  --  9.7   GFR: Estimated Creatinine Clearance: 53 mL/min (A) (by C-G formula based on SCr of 1.46 mg/dL (H)). Liver Function Tests: No results for input(s): AST, ALT, ALKPHOS, BILITOT, PROT, ALBUMIN in the last 168 hours. No results for input(s): LIPASE, AMYLASE in the last 168 hours. No results for input(s): AMMONIA in the last 168 hours. Coagulation Profile: No results for input(s): INR, PROTIME in the last 168 hours. Cardiac Enzymes: No results for input(s): CKTOTAL, CKMB, CKMBINDEX, TROPONINI in the last 168 hours. BNP (last 3 results) No results for input(s): PROBNP in the last 8760 hours. HbA1C: No results for input(s): HGBA1C in the last 72 hours. CBG: Recent Labs  Lab 09/17/18 2345 09/18/18 0026 09/18/18 0101 09/18/18 1729 09/18/18  1914  GLUCAP 275* 238* 193* 381* 397*   Lipid Profile: No results for input(s): CHOL, HDL, LDLCALC, TRIG, CHOLHDL, LDLDIRECT  in the last 72 hours. Thyroid Function Tests: No results for input(s): TSH, T4TOTAL, FREET4, T3FREE, THYROIDAB in the last 72 hours. Anemia Panel: No results for input(s): VITAMINB12, FOLATE, FERRITIN, TIBC, IRON, RETICCTPCT in the last 72 hours. Urine analysis:    Component Value Date/Time   COLORURINE YELLOW 09/18/2018 1842   APPEARANCEUR HAZY (A) 09/18/2018 1842   LABSPEC 1.015 09/18/2018 1842   PHURINE 6.0 09/18/2018 1842   GLUCOSEU >=500 (A) 09/18/2018 1842   HGBUR MODERATE (A) 09/18/2018 1842   BILIRUBINUR NEGATIVE 09/18/2018 1842   BILIRUBINUR negative 01/02/2015 1717   KETONESUR 20 (A) 09/18/2018 1842   PROTEINUR >=300 (A) 09/18/2018 1842   UROBILINOGEN 0.2 01/13/2015 0223   NITRITE NEGATIVE 09/18/2018 1842   LEUKOCYTESUR NEGATIVE 09/18/2018 1842   Sepsis Labs: @LABRCNTIP (procalcitonin:4,lacticidven:4) )No results found for this or any previous visit (from the past 240 hour(s)).   Radiological Exams on Admission: Dg Chest Portable 1 View  Result Date: 09/18/2018 CLINICAL DATA:  Chest pain, vomiting EXAM: PORTABLE CHEST 1 VIEW COMPARISON:  09/17/2018, 09/01/2018 FINDINGS: The heart size and mediastinal contours are within normal limits. Both lungs are clear. The visualized skeletal structures are unremarkable. IMPRESSION: No active disease. Electronically Signed   By: Donavan Foil M.D.   On: 09/18/2018 17:51   Dg Chest Portable 1 View  Result Date: 09/17/2018 CLINICAL DATA:  DKA. Nausea and vomiting. EXAM: PORTABLE CHEST 1 VIEW COMPARISON:  09/01/2018 FINDINGS: The cardiomediastinal contours are normal. Unchanged patchy right infrahilar opacity, which on prior frontal and lateral views localized to the right middle lobe. No new airspace disease. Pulmonary vasculature is normal. No pleural effusion or pneumothorax. No acute osseous abnormalities are  seen. IMPRESSION: Patchy right infrahilar opacity is unchanged from radiographs earlier this month. No new abnormality. Electronically Signed   By: Keith Rake M.D.   On: 09/17/2018 20:40    EKG: Independently reviewed. Sinus tachycardia, rate 110, QTc 445 ms.    1. DKA; type I DM  - Patient has type I DM with A1c 10.0% in March 2020, now presenting with acute on chronic abdominal pain, N/V, and is found to be in DKA  - Serum glucose elevated with low bicarb, elevated AG, urine ketones, and elevated beta-hydroxybutyrate  - She was treated in ED with 2 liters NS, potassium, anti-emetics, analgesics, and was started on insulin infusion - Continue insulin infusion with frequent CBG's and serial chem panels, continue IVF hydration, convert back to sq insulin once DKA resolved and she is able to tolerate a diet    2. Intractable N/V; chronic abdominal pain; diabetic gastroparesis  - Patient has poorly-controlled type I DM with gastroparesis, chronic abdominal pain, and frequent exacerbations, now presenting with acute on chronic abdominal pain and N/V, unable to tolerate anything by mouth  - Continue glycemic-control, scheduled Reglan and prn Zofran, pain-control with return to oral medications as soon as tolerated, monitor renal function and electrolytes   3. Acute kidney injury  - SCr is 1.45 in ED, up from 0.75 the day before  - Likely prerenal azotemia in setting of N/V/D and DKA; anticipate rapid improvement with fluid-resuscitation and treatment of DKA   - She received 2 liters NS in ED - Renally-dose medications, hold her NSAID, repeat chem panel in am    4. Depression, anxiety  - Continue Remeron and as-needed Ativan     PPE: Mask, face shield. Patient wearing mask.  DVT prophylaxis: Lovenox  Code Status: Full  Family Communication: Discussed with patient  Consults called:  none Admission status: Observation    Vianne Bulls, MD [facility] Pager [phone]  If  7PM-7AM, please contact night-coverage [email or web address] Password [facility]  09/18/2018, 7:18 PM

## 2018-09-18 NOTE — ED Triage Notes (Signed)
Pt here for N/V/D and central abdominal pain x 2 days. Seen here yesterday, sts unable to eat since discharge. Has not checked CBG today.

## 2018-09-18 NOTE — ED Provider Notes (Signed)
[facility] EMERGENCY DEPARTMENT Provider Note   CSN: [id number] Arrival date & time: 09/18/18  1710     History   Chief Complaint Chief Complaint  Patient presents with  . Emesis  . Abdominal Pain  . Diarrhea    HPI [name] is a 28 y.o. female.     Patient with history of type 1 diabetes, gastroparesis, previous episodes of DKA --presents with ongoing vomiting, chest pain, abdominal pain.  Patient was seen in the emergency department yesterday for the same symptoms.  She did not have an anion gap.  Beta hydroxybutyrate was a bit elevated.  She was treated with IV fluids with improvement in blood sugar.  She states that she was not really feeling better when she was discharged.  Symptoms persisted today.  Symptoms are similar to when she has had these episodes in the past.  She denies any fever, URI symptoms, cough, shortness of breath.  Abdominal pain and chest pain are generalized.  No urinary symptoms.  No skin rashes.  She has not checked her blood sugar today or taken her insulin.  Onset of symptoms acute.  Course is constant.  Nothing makes symptoms better or worse.     Past Medical History:  Diagnosis Date  . Allergy   . Anemia   . Anxiety   . Blood transfusion without reported diagnosis    Dec 2018  . Cataract    right eye  . Depression   . Diabetes type 1, uncontrolled ([place]) 11/14/2011   Since age 22  . Fibromyalgia   . Gastroparesis   . GERD (gastroesophageal reflux disease)   . Hypertension   . Infection    UTI April 2016    Patient Active Problem List   Diagnosis Date Noted  . Pneumonia 08/20/2018  . Contraception management 08/06/2018  . Intractable vomiting with nausea 07/30/2018  . Cyclical vomiting syndrome 06/07/2018  . Diabetic gastroparesis associated with type 1 diabetes mellitus ([place]) 06/04/2018  . Anxiety 05/31/2018  . Peripheral edema 05/31/2018  . Normocytic anemia 07/10/2017  . GERD (gastroesophageal reflux  disease) 06/23/2017  . 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 Diabetes mellitus type 1 ([place]) 04/28/2013  . Protein-calorie malnutrition, severe ([place]) 04/28/2013  . Hypertension associated with diabetes ([place]) 11/14/2011  . Gastroparesis 11/14/2011  . Chronic abdominal pain 09/18/2011    Past Surgical History:  Procedure Laterality Date  . ANKLE SURGERY    . CHOLECYSTECTOMY  11/15/2011   Procedure: LAPAROSCOPIC CHOLECYSTECTOMY WITH INTRAOPERATIVE CHOLANGIOGRAM;  Surgeon: [name], MD;  Location: [facility];  Service: General;  Laterality: N/A;  . COLONOSCOPY    . COLONOSCOPY WITH PROPOFOL N/A 06/27/2017   Procedure: COLONOSCOPY WITH PROPOFOL;  Surgeon: Milus Banister, MD;  Location: [facility];  Service: Endoscopy;  Laterality: N/A;  . ESOPHAGOGASTRODUODENOSCOPY  12/03/2011   Procedure: ESOPHAGOGASTRODUODENOSCOPY (EGD);  Surgeon: Beryle Beams, MD;  Location: [facility];  Service: Endoscopy;  Laterality: N/A;  . FLEXIBLE SIGMOIDOSCOPY N/A 03/10/2017   Procedure: FLEXIBLE SIGMOIDOSCOPY;  Surgeon: Carol Ada, MD;  Location: [facility];  Service: Endoscopy;  Laterality: N/A;  . INCISION AND DRAINAGE PERIRECTAL ABSCESS N/A 03/01/2017   Procedure: IRRIGATION AND DEBRIDEMENT PERIRECTAL ABSCESS;  Surgeon: Alphonsa Overall, MD;  Location: [facility];  Service: General;  Laterality: N/A;  . IRRIGATION AND DEBRIDEMENT BUTTOCKS N/A 03/23/2017   Procedure: IRRIGATION AND DEBRIDEMENT BUTTOCKS, SETON PLACEMENT;  Surgeon: [name], MD;  Location: [facility];  Service: General;  Laterality: N/A;  . LAPAROSCOPY  11/23/2011   Procedure: LAPAROSCOPY DIAGNOSTIC;  Surgeon: Mariella Saa, MD;  Location: [facility];  Service: General;  Laterality: N/A;  . SIGMOIDOSCOPY    . UPPER GASTROINTESTINAL ENDOSCOPY    . WISDOM TOOTH EXTRACTION       OB History    Gravida  1   Para  1   Term      Preterm  1   AB      Living  1     SAB      TAB      Ectopic       Multiple  0   Live Births  1            Home Medications    Prior to Admission medications   Medication Sig Start Date End Date Taking? Authorizing Provider  benzonatate (TESSALON PERLES) 100 MG capsule Take 1 capsule (100 mg total) by mouth 3 (three) times daily as needed. Patient taking differently: Take 100 mg by mouth 3 (three) times daily as needed for cough.  08/13/18   Daphine Deutscher Mary-Margaret, FNP  Continuous Blood Gluc Sensor (FREESTYLE LIBRE 14 DAY SENSOR) MISC 1 Device by Does not apply route every 14 (fourteen) days. 06/22/18   Ardith Dark, MD  diclofenac (VOLTAREN) 75 MG EC tablet Take 1 tablet (75 mg total) by mouth 2 (two) times daily. Patient taking differently: Take 75 mg by mouth daily.  08/06/18   Ardith Dark, MD  dicyclomine (BENTYL) 20 MG tablet Take 1 tablet (20 mg total) by mouth 3 (three) times daily as needed for spasms (abdominal cramping). 06/03/18   Long, Arlyss Repress, MD  furosemide (LASIX) 40 MG tablet Take 1 tablet (40 mg total) by mouth daily. 07/20/18   Armbruster, Willaim Rayas, MD  insulin lispro (HUMALOG) 100 UNIT/ML injection Inject 0.04 mLs (4 Units total) into the skin 3 (three) times daily with meals. Patient taking differently: Inject 0-10 Units into the skin See admin instructions. Sliding scale 06/18/18   Romero Belling, MD  LANTUS 100 UNIT/ML injection Inject 0.05 mLs (5 Units total) into the skin at bedtime. Patient using Vials Patient taking differently: Inject 8 Units into the skin at bedtime. Patient using Vials 06/18/18   Romero Belling, MD  lidocaine (LIDODERM) 5 % Place 2 patches onto the skin daily. Remove & Discard patch within 12 hours or as directed by MD 08/03/18   Calvert Cantor, MD  LORazepam (ATIVAN) 0.5 MG tablet TAKE 1 TABLET BY MOUTH  TWICE A DAY AS NEEDED FOR  ANXIETY Patient taking differently: Take 0.5 mg by mouth 2 (two) times daily as needed for anxiety.  08/19/18   Ardith Dark, MD  metoCLOPramide (REGLAN) 10 MG tablet Take 1  tablet (10 mg total) by mouth every 6 (six) hours as needed for nausea or vomiting. 02/20/18   Burnadette Pop, MD  mirtazapine (REMERON) 30 MG tablet Take 1 tablet (30 mg total) by mouth at bedtime. 05/31/18   Ardith Dark, MD  ondansetron (ZOFRAN) 4 MG tablet Take 1 tablet (4 mg total) by mouth every 8 (eight) hours as needed for nausea or vomiting (If necessary, dont combine with Reglan.). 03/25/18   Amin, Loura Halt, MD  pantoprazole (PROTONIX) 40 MG tablet Take 1 tablet (40 mg total) by mouth 2 (two) times daily. 06/10/18   Marguerita Merles Latif, DO  promethazine (PHENERGAN) 12.5 MG suppository Place 1 suppository (12.5 mg total) rectally every 8 (eight) hours as  needed for nausea or vomiting. Note: Do not take while taking oral phenergan 09/18/18   Melene PlanFloyd, Dan, DO  promethazine (PHENERGAN) 12.5 MG tablet TAKE 1 TABLET BY MOUTH  EVERY 8 HOURS AS NEEDED FOR NAUSEA AND VOMITING Patient taking differently: Take 12.5 mg by mouth every 8 (eight) hours as needed for nausea or vomiting.  08/11/18   Armbruster, Willaim RayasSteven P, MD    Family History Family History  Problem Relation Age of Onset  . Diabetes Mother   . Hypertension Father   . Colon cancer Paternal Grandmother        pt thinks PGM was dx in her 650's  . Diabetes Paternal Grandmother   . Diabetes Maternal Grandmother   . Diabetes Maternal Grandfather   . Diabetes Paternal Grandfather   . Diabetes Other   . Esophageal cancer Neg Hx   . Liver cancer Neg Hx   . Pancreatic cancer Neg Hx   . Stomach cancer Neg Hx   . Rectal cancer Neg Hx     Social History Social History   Tobacco Use  . Smoking status: Never Smoker  . Smokeless tobacco: Never Used  Substance Use Topics  . Alcohol use: No    Alcohol/week: 0.0 standard drinks    Frequency: Never  . Drug use: No     Allergies   Other and Lactose intolerance (gi)   Review of Systems Review of Systems  Constitutional: Negative for fever.  HENT: Negative for rhinorrhea and sore  throat.   Eyes: Negative for redness.  Respiratory: Negative for cough and shortness of breath.   Cardiovascular: Positive for chest pain.  Gastrointestinal: Positive for abdominal pain, nausea and vomiting. Negative for diarrhea.  Genitourinary: Negative for dysuria.  Musculoskeletal: Negative for myalgias.  Skin: Negative for rash.  Neurological: Negative for headaches.     Physical Exam Updated Vital Signs BP (!) 147/114 (BP Location: Right Arm)   Pulse (!) 111   Temp 98.1 F (36.7 C) (Oral)   Resp (!) 22   Ht 5\' 7"  (1.702 m)   Wt 59 kg   LMP 08/19/2018 Comment: pt shielded  SpO2 100%   BMI 20.36 kg/m   Physical Exam Vitals signs and nursing note reviewed.  Constitutional:      General: She is in acute distress (crying).     Appearance: She is well-developed.     Comments: Patient holding an emesis bag that is partially filled.  HENT:     Head: Normocephalic and atraumatic.  Eyes:     General:        Right eye: No discharge.        Left eye: No discharge.     Conjunctiva/sclera: Conjunctivae normal.  Neck:     Musculoskeletal: Normal range of motion and neck supple.  Cardiovascular:     Rate and Rhythm: Normal rate and regular rhythm.     Heart sounds: Normal heart sounds.  Pulmonary:     Effort: Pulmonary effort is normal.     Breath sounds: Normal breath sounds.  Abdominal:     Palpations: Abdomen is soft.     Tenderness: There is generalized abdominal tenderness (mild). There is no guarding or rebound.  Skin:    General: Skin is warm and dry.     Capillary Refill: Capillary refill takes less than 2 seconds.  Neurological:     Mental Status: She is alert.      ED Treatments / Results  Labs (all labs ordered are listed, but  only abnormal results are displayed) Labs Reviewed  CBC WITH DIFFERENTIAL/PLATELET - Abnormal; Notable for the following components:      Result Value   Hemoglobin 11.1 (*)    HCT 34.1 (*)    All other components within normal  limits  BASIC METABOLIC PANEL - Abnormal; Notable for the following components:   CO2 16 (*)    Glucose, Bld 420 (*)    BUN 27 (*)    Creatinine, Ser 1.46 (*)    GFR calc non Af Amer 48 (*)    GFR calc Af Amer 56 (*)    Anion gap 22 (*)    All other components within normal limits  URINALYSIS, ROUTINE W REFLEX MICROSCOPIC - Abnormal; Notable for the following components:   APPearance HAZY (*)    Glucose, UA >=500 (*)    Hgb urine dipstick MODERATE (*)    Ketones, ur 20 (*)    Protein, ur >=300 (*)    Bacteria, UA RARE (*)    All other components within normal limits  CBG MONITORING, ED - Abnormal; Notable for the following components:   Glucose-Capillary 381 (*)    All other components within normal limits  SARS CORONAVIRUS 2 (HOSPITAL ORDER, PERFORMED IN [facility])  URINE CULTURE  BETA-HYDROXYBUTYRIC ACID  I-STAT BETA HCG BLOOD, ED ([facility], [facility], [facility] ONLY)    EKG EKG Interpretation  Date/Time:  Saturday September 18 2018 17:18:52 EDT Ventricular Rate:  110 PR Interval:    QRS Duration: 79 QT Interval:  329 QTC Calculation: 445 R Axis:   72 Text Interpretation:  Sinus tachycardia Right atrial enlargement Baseline wander in lead(s) V3 Partial missing lead(s): V3 No significant change since last tracing Confirmed by Alvira MondaySchlossman, Erin ([id number]) on 09/18/2018 5:44:07 PM   Radiology Dg Chest Portable 1 View  Result Date: 09/17/2018 CLINICAL DATA:  DKA. Nausea and vomiting. EXAM: PORTABLE CHEST 1 VIEW COMPARISON:  09/01/2018 FINDINGS: The cardiomediastinal contours are normal. Unchanged patchy right infrahilar opacity, which on prior frontal and lateral views localized to the right middle lobe. No new airspace disease. Pulmonary vasculature is normal. No pleural effusion or pneumothorax. No acute osseous abnormalities are seen. IMPRESSION: Patchy right infrahilar opacity is unchanged from radiographs earlier this month. No new abnormality. Electronically Signed   By: Narda RutherfordMelanie   San[place] M.D.   On: 09/17/2018 20:40    Procedures Procedures (including critical care time)  Medications Ordered in ED Medications  insulin regular, human (MYXREDLIN) 100 units/ 100 mL infusion (has no administration in time range)  sodium chloride 0.9 % bolus 1,000 mL (1,000 mLs Intravenous New Bag/Given 09/18/18 1905)    And  0.9 %  sodium chloride infusion ( Intravenous New Bag/Given 09/18/18 1907)  potassium chloride 10 mEq in 100 mL IVPB (10 mEq Intravenous New Bag/Given 09/18/18 1907)  sodium chloride 0.9 % bolus 1,000 mL (0 mLs Intravenous Stopped 09/18/18 1904)  fentaNYL (SUBLIMAZE) injection 50 mcg (50 mcg Intravenous Given 09/18/18 1749)  metoCLOPramide (REGLAN) injection 10 mg (10 mg Intravenous Given 09/18/18 1750)     Initial Impression / Assessment and Plan / ED Course  I have reviewed the triage vital signs and the nursing notes.  Pertinent labs & imaging results that were available during my care of the patient were reviewed by me and considered in my medical decision making (see chart for details).        Patient seen and examined. Work-up initiated. Medications ordered.  Suspect that patient will require admission given intractable  symptoms.  Will reassess.  Vital signs reviewed and are as follows: BP (!) 147/114 (BP Location: Right Arm)   Pulse (!) 111   Temp 98.1 F (36.7 C) (Oral)   Resp (!) 22   Ht 5\' 7"  (1.702 m)   Wt 59 kg   LMP 08/19/2018 Comment: pt shielded  SpO2 100%   BMI 20.36 kg/m   EKG reviewed.  No QTC prolongation today.  No evidence of hyperkalemia.  7:10 PM labs are more concerning for DKA today with elevated anion gap to 22.  Patient rechecked and is stable.  Patient placed on glucose stabilizer protocol.  We will continue hydration.  Discussed case with Dr. Antionette Char who will see in the emergency department.   Urine with white blood cells noted.  Culture added.  Defer treatment to inpatient team.  CRITICAL CARE Performed by: Renne Crigler  PA-C Total critical care time: 40 minutes Critical care time was exclusive of separately billable procedures and treating other patients. Critical care was necessary to treat or prevent imminent or life-threatening deterioration. Critical care was time spent personally by me on the following activities: development of treatment plan with patient and/or surrogate as well as nursing, discussions with consultants, evaluation of patient's response to treatment, examination of patient, obtaining history from patient or surrogate, ordering and performing treatments and interventions, ordering and review of laboratory studies, ordering and review of radiographic studies, pulse oximetry and re-evaluation of patient's condition.   Final Clinical Impressions(s) / ED Diagnoses   Final diagnoses:  Type 1 diabetes mellitus with ketoacidosis without coma (HCC)  Acute kidney injury (HCC)  Intractable vomiting with nausea, unspecified vomiting type   Admit.   ED Discharge Orders    None       Renne Crigler, Cordelia Poche 09/18/18 1913    Alvira Monday, MD 09/20/18 [id number]

## 2018-09-18 NOTE — Discharge Instructions (Signed)
Follow up with your endocrinologist and GI doc.

## 2018-09-18 NOTE — ED Notes (Signed)
ED TO INPATIENT HANDOFF REPORT  ED Nurse Name and Phone #: Jess F   S Name/Age/Gender [name] 28 y.o. female Room/Bed: 029C/029C  Code Status   Code Status: Full Code  Home/SNF/Other Home Patient oriented to: self, place, time and situation Is this baseline? Yes   Triage Complete: Triage complete  Chief Complaint VOMITING  Triage Note Pt here for N/V/D and central abdominal pain x 2 days. Seen here yesterday, sts unable to eat since discharge. Has not checked CBG today.    Allergies Allergies  Allergen Reactions  . Other Anaphylaxis    Reaction to [place] nuts   . Lactose Intolerance (Gi) Diarrhea    Level of Care/Admitting Diagnosis ED Disposition    ED Disposition Condition Comment   Admit  Hospital Area: [facility] [[id number]]  Level of Care: Progressive [102]  I expect the patient will be discharged within 24 hours: Yes  LOW acuity---Tx typically complete <24 hrs---ACUTE conditions typically can be evaluated <24 hours---LABS likely to return to acceptable levels <24 hours---IS near functional baseline---EXPECTED to return to current living arrangement---NOT newly hypoxic: Does not meet criteria for 5C-Observation unit  Covid Evaluation: Screening Protocol (No Symptoms)  Diagnosis: DKA, type 1 (HCC) [623762]  Admitting Physician: Briscoe Deutscher [[id number]]  Attending Physician: Briscoe Deutscher [[id number]]  PT Class (Do Not Modify): Observation [104]  PT Acc Code (Do Not Modify): Observation [10022]       B Medical/Surgery History Past Medical History:  Diagnosis Date  . Allergy   . Anemia   . Anxiety   . Blood transfusion without reported diagnosis    Dec 2018  . Cataract    right eye  . Depression   . Diabetes type 1, uncontrolled (HCC) 11/14/2011   Since age 13  . Fibromyalgia   . Gastroparesis   . GERD (gastroesophageal reflux disease)   . Hypertension   . Infection    UTI April 2016   Past Surgical History:  Procedure  Laterality Date  . ANKLE SURGERY    . CHOLECYSTECTOMY  11/15/2011   Procedure: LAPAROSCOPIC CHOLECYSTECTOMY WITH INTRAOPERATIVE CHOLANGIOGRAM;  Surgeon: [name], MD;  Location: [facility];  Service: General;  Laterality: N/A;  . COLONOSCOPY    . COLONOSCOPY WITH PROPOFOL N/A 06/27/2017   Procedure: COLONOSCOPY WITH PROPOFOL;  Surgeon: Rachael Fee, MD;  Location: [facility];  Service: Endoscopy;  Laterality: N/A;  . ESOPHAGOGASTRODUODENOSCOPY  12/03/2011   Procedure: ESOPHAGOGASTRODUODENOSCOPY (EGD);  Surgeon: Theda Belfast, MD;  Location: [facility];  Service: Endoscopy;  Laterality: N/A;  . FLEXIBLE SIGMOIDOSCOPY N/A 03/10/2017   Procedure: FLEXIBLE SIGMOIDOSCOPY;  Surgeon: Jeani Hawking, MD;  Location: [facility];  Service: Endoscopy;  Laterality: N/A;  . INCISION AND DRAINAGE PERIRECTAL ABSCESS N/A 03/01/2017   Procedure: IRRIGATION AND DEBRIDEMENT PERIRECTAL ABSCESS;  Surgeon: Ovidio Kin, MD;  Location: [facility];  Service: General;  Laterality: N/A;  . IRRIGATION AND DEBRIDEMENT BUTTOCKS N/A 03/23/2017   Procedure: IRRIGATION AND DEBRIDEMENT BUTTOCKS, SETON PLACEMENT;  Surgeon: Romie Levee, MD;  Location: [facility];  Service: General;  Laterality: N/A;  . LAPAROSCOPY  11/23/2011   Procedure: LAPAROSCOPY DIAGNOSTIC;  Surgeon: Mariella Saa, MD;  Location: [facility];  Service: General;  Laterality: N/A;  . SIGMOIDOSCOPY    . UPPER GASTROINTESTINAL ENDOSCOPY    . WISDOM TOOTH EXTRACTION       A IV Location/Drains/Wounds Patient Lines/Drains/Airways Status   Active Line/Drains/Airways    Name:   Placement date:  Placement time:   Site:   Days:   Peripheral IV 09/18/18 Right Antecubital   09/18/18    1729    Antecubital   less than 1          Intake/Output Last 24 hours  Intake/Output Summary (Last 24 hours) at 09/18/2018 1954 Last data filed at 09/18/2018 1904 Gross per 24 hour  Intake 1000 ml  Output -  Net 1000 ml    Labs/Imaging Results for orders placed or  performed during the hospital encounter of 09/18/18 (from the past 48 hour(s))  CBG monitoring, ED     Status: Abnormal   Collection Time: 09/18/18  5:29 PM  Result Value Ref Range   Glucose-Capillary 381 (H) 70 - 99 mg/dL  CBC with Differential/Platelet     Status: Abnormal   Collection Time: 09/18/18  5:48 PM  Result Value Ref Range   WBC 6.4 4.0 - 10.5 K/uL   RBC 4.04 3.87 - 5.11 MIL/uL   Hemoglobin 11.1 (L) 12.0 - 15.0 g/dL   HCT 21.334.1 (L) 08.636.0 - 57.846.0 %   MCV 84.4 80.0 - 100.0 fL   MCH 27.5 26.0 - 34.0 pg   MCHC 32.6 30.0 - 36.0 g/dL   RDW 46.913.1 62.911.5 - 52.815.5 %   Platelets 226 150 - 400 K/uL   nRBC 0.0 0.0 - 0.2 %   Neutrophils Relative % 79 %   Neutro Abs 5.1 1.7 - 7.7 K/uL   Lymphocytes Relative 16 %   Lymphs Abs 1.0 0.7 - 4.0 K/uL   Monocytes Relative 4 %   Monocytes Absolute 0.2 0.1 - 1.0 K/uL   Eosinophils Relative 0 %   Eosinophils Absolute 0.0 0.0 - 0.5 K/uL   Basophils Relative 0 %   Basophils Absolute 0.0 0.0 - 0.1 K/uL   Immature Granulocytes 1 %   Abs Immature Granulocytes 0.03 0.00 - 0.07 K/uL    Comment: Performed at [facility], [address] [place], [place] [address]  Basic metabolic panel     Status: Abnormal   Collection Time: 09/18/18  5:48 PM  Result Value Ref Range   Sodium 137 135 - 145 mmol/L   Potassium 4.0 3.5 - 5.1 mmol/L   Chloride 99 98 - 111 mmol/L   CO2 16 (L) 22 - 32 mmol/L   Glucose, Bld 420 (H) 70 - 99 mg/dL   BUN 27 (H) 6 - 20 mg/dL   Creatinine, Ser 4.011.46 (H) 0.44 - 1.00 mg/dL   Calcium 9.7 8.9 - 02.710.3 mg/dL   GFR calc non Af Amer 48 (L) >60 mL/min   GFR calc Af Amer 56 (L) >60 mL/min   Anion gap 22 (H) 5 - 15    Comment: REPEATED TO VERIFY Performed at [facility], [address] [place], [place] [address]   Beta-hydroxybutyric acid     Status: Abnormal   Collection Time: 09/18/18  5:48 PM  Result Value Ref Range   Beta-Hydroxybutyric Acid 7.25 (H) 0.05 - 0.27 mmol/L    Comment: RESULTS CONFIRMED BY MANUAL  DILUTION Performed at [facility], [address] [place], [place] [address]   Urinalysis, Routine w reflex microscopic     Status: Abnormal   Collection Time: 09/18/18  6:42 PM  Result Value Ref Range   Color, Urine YELLOW YELLOW   APPearance HAZY (A) CLEAR   Specific Gravity, Urine 1.015 1.005 - 1.030   pH 6.0 5.0 - 8.0   Glucose, UA >=500 (  A) NEGATIVE mg/dL   Hgb urine dipstick MODERATE (A) NEGATIVE   Bilirubin Urine NEGATIVE NEGATIVE   Ketones, ur 20 (A) NEGATIVE mg/dL   Protein, ur >=300 (A) NEGATIVE mg/dL   Nitrite NEGATIVE NEGATIVE   Leukocytes,Ua NEGATIVE NEGATIVE   RBC / HPF 21-50 0 - 5 RBC/hpf   WBC, UA 21-50 0 - 5 WBC/hpf   Bacteria, UA RARE (A) NONE SEEN   Squamous Epithelial / LPF 6-10 0 - 5   Mucus PRESENT    Hyaline Casts, UA PRESENT     Comment: Performed at [facility], [place] [address], [place], [place] [address]  CBG monitoring, ED     Status: Abnormal   Collection Time: 09/18/18  7:14 PM  Result Value Ref Range   Glucose-Capillary 397 (H) 70 - 99 mg/dL   Comment 1 Notify RN    Comment 2 Document in Chart   I-Stat beta hCG blood, ED     Status: None   Collection Time: 09/18/18  7:18 PM  Result Value Ref Range   I-stat hCG, quantitative <5.0 <5 mIU/mL   Comment 3            Comment:   GEST. AGE      CONC.  (mIU/mL)   <=1 WEEK        5 - 50     2 WEEKS       50 - 500     3 WEEKS       100 - 10,000     4 WEEKS     1,000 - 30,000        FEMALE AND NON-PREGNANT FEMALE:     LESS THAN 5 mIU/mL    Dg Chest Portable 1 View  Result Date: 09/18/2018 CLINICAL DATA:  Chest pain, vomiting EXAM: PORTABLE CHEST 1 VIEW COMPARISON:  09/17/2018, 09/01/2018 FINDINGS: The heart size and mediastinal contours are within normal limits. Both lungs are clear. The visualized skeletal structures are unremarkable. IMPRESSION: No active disease. Electronically Signed   By: Donavan Foil M.D.   On: 09/18/2018 17:51   Dg Chest Portable 1 View  Result Date:  09/17/2018 CLINICAL DATA:  DKA. Nausea and vomiting. EXAM: PORTABLE CHEST 1 VIEW COMPARISON:  09/01/2018 FINDINGS: The cardiomediastinal contours are normal. Unchanged patchy right infrahilar opacity, which on prior frontal and lateral views localized to the right middle lobe. No new airspace disease. Pulmonary vasculature is normal. No pleural effusion or pneumothorax. No acute osseous abnormalities are seen. IMPRESSION: Patchy right infrahilar opacity is unchanged from radiographs earlier this month. No new abnormality. Electronically Signed   By: Keith Rake M.D.   On: 09/17/2018 20:40    Pending Labs Unresulted Labs (From admission, onward)    Start     Ordered   09/18/18 8182  Basic metabolic panel  Now then every 4 hours,   R (with STAT occurrences)     09/18/18 1941   09/18/18 1911  Urine Culture  Add-on,   AD     09/18/18 1910   09/18/18 1851  SARS Coronavirus 2 (CEPHEID - Performed in [place] hospital lab), Hosp Order  (Asymptomatic Patients Labs)  Once,   STAT    Question:  Rule Out  Answer:  Yes   09/18/18 1850   Signed and Held  Creatinine, serum  (enoxaparin (LOVENOX)    CrCl >/= 30 ml/min)  Weekly,   R    Comments: while on enoxaparin therapy    Signed and Held  Signed and Held  CBC WITH DIFFERENTIAL  Tomorrow morning,   R     Signed and Held   Signed and Held  Sodium, urine, random  Add-on,   R     Signed and Held   Signed and Held  Creatinine, urine, random  Add-on,   R     Signed and Held   Signed and Held  Urea nitrogen, urine  Add-on,   R     Signed and Held          Vitals/Pain Today's Vitals   09/18/18 1815 09/18/18 1845 09/18/18 1853 09/18/18 1900  BP: (!) 167/99 132/83  (!) 142/92  Pulse: (!) 114 (!) 119  (!) 118  Resp: 16 16  17   Temp:      TempSrc:      SpO2: 100% 98%  99%  Weight:      Height:      PainSc:   10-Worst pain ever     Isolation Precautions No active isolations  Medications Medications  insulin regular, human (MYXREDLIN)  100 units/ 100 mL infusion (3.4 Units/hr Intravenous New Bag/Given 09/18/18 1917)  potassium chloride 10 mEq in 100 mL IVPB (10 mEq Intravenous New Bag/Given 09/18/18 1907)  LORazepam (ATIVAN) tablet 0.5 mg (has no administration in time range)  0.9 %  sodium chloride infusion (has no administration in time range)  ondansetron (ZOFRAN) tablet 4 mg (has no administration in time range)    Or  ondansetron (ZOFRAN) injection 4 mg (has no administration in time range)  metoCLOPramide (REGLAN) injection 10 mg (has no administration in time range)  fentaNYL (SUBLIMAZE) injection 25-50 mcg (has no administration in time range)  promethazine (PHENERGAN) injection 12.5 mg (has no administration in time range)  sodium chloride 0.9 % bolus 1,000 mL (0 mLs Intravenous Stopped 09/18/18 1904)  fentaNYL (SUBLIMAZE) injection 50 mcg (50 mcg Intravenous Given 09/18/18 1749)  metoCLOPramide (REGLAN) injection 10 mg (10 mg Intravenous Given 09/18/18 1750)  sodium chloride 0.9 % bolus 1,000 mL (1,000 mLs Intravenous New Bag/Given 09/18/18 1905)    Mobility walks Low fall risk   Focused Assessments Hyperglycemia    R Recommendations: See Admitting Provider Note  Report given to:   Additional Notes: n/a

## 2018-09-19 DIAGNOSIS — Z8249 Family history of ischemic heart disease and other diseases of the circulatory system: Secondary | ICD-10-CM | POA: Diagnosis not present

## 2018-09-19 DIAGNOSIS — K529 Noninfective gastroenteritis and colitis, unspecified: Secondary | ICD-10-CM | POA: Diagnosis present

## 2018-09-19 DIAGNOSIS — M797 Fibromyalgia: Secondary | ICD-10-CM | POA: Diagnosis present

## 2018-09-19 DIAGNOSIS — Z888 Allergy status to other drugs, medicaments and biological substances status: Secondary | ICD-10-CM | POA: Diagnosis not present

## 2018-09-19 DIAGNOSIS — E1043 Type 1 diabetes mellitus with diabetic autonomic (poly)neuropathy: Secondary | ICD-10-CM | POA: Diagnosis present

## 2018-09-19 DIAGNOSIS — Z91011 Allergy to milk products: Secondary | ICD-10-CM | POA: Diagnosis not present

## 2018-09-19 DIAGNOSIS — F1290 Cannabis use, unspecified, uncomplicated: Secondary | ICD-10-CM | POA: Diagnosis present

## 2018-09-19 DIAGNOSIS — K760 Fatty (change of) liver, not elsewhere classified: Secondary | ICD-10-CM | POA: Diagnosis present

## 2018-09-19 DIAGNOSIS — R109 Unspecified abdominal pain: Secondary | ICD-10-CM | POA: Diagnosis not present

## 2018-09-19 DIAGNOSIS — G8929 Other chronic pain: Secondary | ICD-10-CM | POA: Diagnosis present

## 2018-09-19 DIAGNOSIS — K3184 Gastroparesis: Secondary | ICD-10-CM | POA: Diagnosis present

## 2018-09-19 DIAGNOSIS — R1010 Upper abdominal pain, unspecified: Secondary | ICD-10-CM | POA: Diagnosis not present

## 2018-09-19 DIAGNOSIS — E1010 Type 1 diabetes mellitus with ketoacidosis without coma: Secondary | ICD-10-CM | POA: Diagnosis present

## 2018-09-19 DIAGNOSIS — R112 Nausea with vomiting, unspecified: Secondary | ICD-10-CM | POA: Diagnosis not present

## 2018-09-19 DIAGNOSIS — Z833 Family history of diabetes mellitus: Secondary | ICD-10-CM | POA: Diagnosis not present

## 2018-09-19 DIAGNOSIS — K219 Gastro-esophageal reflux disease without esophagitis: Secondary | ICD-10-CM | POA: Diagnosis present

## 2018-09-19 DIAGNOSIS — Z79899 Other long term (current) drug therapy: Secondary | ICD-10-CM | POA: Diagnosis not present

## 2018-09-19 DIAGNOSIS — F419 Anxiety disorder, unspecified: Secondary | ICD-10-CM | POA: Diagnosis present

## 2018-09-19 DIAGNOSIS — I152 Hypertension secondary to endocrine disorders: Secondary | ICD-10-CM | POA: Diagnosis present

## 2018-09-19 DIAGNOSIS — E1069 Type 1 diabetes mellitus with other specified complication: Secondary | ICD-10-CM | POA: Diagnosis present

## 2018-09-19 DIAGNOSIS — E876 Hypokalemia: Secondary | ICD-10-CM | POA: Diagnosis present

## 2018-09-19 DIAGNOSIS — Z1159 Encounter for screening for other viral diseases: Secondary | ICD-10-CM | POA: Diagnosis not present

## 2018-09-19 DIAGNOSIS — F329 Major depressive disorder, single episode, unspecified: Secondary | ICD-10-CM | POA: Diagnosis present

## 2018-09-19 DIAGNOSIS — N179 Acute kidney failure, unspecified: Secondary | ICD-10-CM | POA: Diagnosis present

## 2018-09-19 DIAGNOSIS — Z9049 Acquired absence of other specified parts of digestive tract: Secondary | ICD-10-CM | POA: Diagnosis not present

## 2018-09-19 DIAGNOSIS — E860 Dehydration: Secondary | ICD-10-CM | POA: Diagnosis present

## 2018-09-19 LAB — GLUCOSE, CAPILLARY
Glucose-Capillary: 108 mg/dL — ABNORMAL HIGH (ref 70–99)
Glucose-Capillary: 112 mg/dL — ABNORMAL HIGH (ref 70–99)
Glucose-Capillary: 116 mg/dL — ABNORMAL HIGH (ref 70–99)
Glucose-Capillary: 119 mg/dL — ABNORMAL HIGH (ref 70–99)
Glucose-Capillary: 148 mg/dL — ABNORMAL HIGH (ref 70–99)
Glucose-Capillary: 156 mg/dL — ABNORMAL HIGH (ref 70–99)
Glucose-Capillary: 159 mg/dL — ABNORMAL HIGH (ref 70–99)
Glucose-Capillary: 170 mg/dL — ABNORMAL HIGH (ref 70–99)
Glucose-Capillary: 174 mg/dL — ABNORMAL HIGH (ref 70–99)
Glucose-Capillary: 177 mg/dL — ABNORMAL HIGH (ref 70–99)
Glucose-Capillary: 193 mg/dL — ABNORMAL HIGH (ref 70–99)
Glucose-Capillary: 194 mg/dL — ABNORMAL HIGH (ref 70–99)
Glucose-Capillary: 256 mg/dL — ABNORMAL HIGH (ref 70–99)
Glucose-Capillary: 294 mg/dL — ABNORMAL HIGH (ref 70–99)
Glucose-Capillary: 46 mg/dL — ABNORMAL LOW (ref 70–99)
Glucose-Capillary: 68 mg/dL — ABNORMAL LOW (ref 70–99)
Glucose-Capillary: 73 mg/dL (ref 70–99)
Glucose-Capillary: 73 mg/dL (ref 70–99)
Glucose-Capillary: 75 mg/dL (ref 70–99)

## 2018-09-19 LAB — BASIC METABOLIC PANEL
Anion gap: 12 (ref 5–15)
Anion gap: 9 (ref 5–15)
Anion gap: 12 (ref 5–15)
BUN: 24 mg/dL — ABNORMAL HIGH (ref 6–20)
BUN: 24 mg/dL — ABNORMAL HIGH (ref 6–20)
BUN: 20 mg/dL (ref 6–20)
CO2: 22 mmol/L (ref 22–32)
CO2: 22 mmol/L (ref 22–32)
CO2: 21 mmol/L — ABNORMAL LOW (ref 22–32)
Calcium: 8.3 mg/dL — ABNORMAL LOW (ref 8.9–10.3)
Calcium: 8.4 mg/dL — ABNORMAL LOW (ref 8.9–10.3)
Calcium: 8.5 mg/dL — ABNORMAL LOW (ref 8.9–10.3)
Chloride: 108 mmol/L (ref 98–111)
Chloride: 106 mmol/L (ref 98–111)
Chloride: 104 mmol/L (ref 98–111)
Creatinine, Ser: 0.89 mg/dL (ref 0.44–1.00)
Creatinine, Ser: 0.83 mg/dL (ref 0.44–1.00)
Creatinine, Ser: 0.7 mg/dL (ref 0.44–1.00)
GFR calc Af Amer: >60 mL/min (ref >60)
GFR calc Af Amer: >60 mL/min (ref >60)
GFR calc Af Amer: >60 mL/min (ref >60)
GFR calc non Af Amer: >60 mL/min (ref >60)
GFR calc non Af Amer: >60 mL/min (ref >60)
GFR calc non Af Amer: >60 mL/min (ref >60)
Glucose, Bld: 82 mg/dL (ref 70–99)
Glucose, Bld: 201 mg/dL — ABNORMAL HIGH (ref 70–99)
Glucose, Bld: 133 mg/dL — ABNORMAL HIGH (ref 70–99)
Potassium: 3.5 mmol/L (ref 3.5–5.1)
Potassium: 3.5 mmol/L (ref 3.5–5.1)
Potassium: 3.5 mmol/L (ref 3.5–5.1)
Sodium: 142 mmol/L (ref 135–145)
Sodium: 137 mmol/L (ref 135–145)
Sodium: 137 mmol/L (ref 135–145)

## 2018-09-19 LAB — HEPATIC FUNCTION PANEL
ALT: 43 U/L (ref 0–44)
AST: 22 U/L (ref 15–41)
Albumin: 2.8 g/dL — ABNORMAL LOW (ref 3.5–5.0)
Alkaline Phosphatase: 89 U/L (ref 38–126)
Bilirubin, Direct: 0.2 mg/dL (ref 0.0–0.2)
Indirect Bilirubin: 0.6 mg/dL (ref 0.3–0.9)
Total Bilirubin: 0.8 mg/dL (ref 0.3–1.2)
Total Protein: 6.7 g/dL (ref 6.5–8.1)

## 2018-09-19 LAB — CBC WITH DIFFERENTIAL/PLATELET
Abs Immature Granulocytes: 0.04 10*3/uL (ref 0.00–0.07)
Basophils Absolute: 0 10*3/uL (ref 0.0–0.1)
Basophils Relative: 0 %
Eosinophils Absolute: 0 10*3/uL (ref 0.0–0.5)
Eosinophils Relative: 0 %
HCT: 28.8 % — ABNORMAL LOW (ref 36.0–46.0)
Hemoglobin: 9.4 g/dL — ABNORMAL LOW (ref 12.0–15.0)
Immature Granulocytes: 1 %
Lymphocytes Relative: 22 %
Lymphs Abs: 1.4 10*3/uL (ref 0.7–4.0)
MCH: 27.5 pg (ref 26.0–34.0)
MCHC: 32.6 g/dL (ref 30.0–36.0)
MCV: 84.2 fL (ref 80.0–100.0)
Monocytes Absolute: 0.4 10*3/uL (ref 0.1–1.0)
Monocytes Relative: 6 %
Neutro Abs: 4.6 10*3/uL (ref 1.7–7.7)
Neutrophils Relative %: 71 %
Platelets: 200 10*3/uL (ref 150–400)
RBC: 3.42 MIL/uL — ABNORMAL LOW (ref 3.87–5.11)
RDW: 13.2 % (ref 11.5–15.5)
WBC: 6.5 10*3/uL (ref 4.0–10.5)
nRBC: 0 % (ref 0.0–0.2)

## 2018-09-19 LAB — C-REACTIVE PROTEIN: CRP: <0.8 mg/dL (ref <1.0)

## 2018-09-19 LAB — MRSA PCR SCREENING: MRSA by PCR: NEGATIVE

## 2018-09-19 LAB — CREATININE, URINE, RANDOM: Creatinine, Urine: 95.63 mg/dL

## 2018-09-19 LAB — NOVEL CORONAVIRUS, NAA (HOSP ORDER, SEND-OUT TO REF LAB; TAT 18-24 HRS): SARS-CoV-2, NAA: NOT DETECTED

## 2018-09-19 LAB — SODIUM, URINE, RANDOM: Sodium, Ur: 82 mmol/L

## 2018-09-19 LAB — LIPASE, BLOOD: Lipase: 39 U/L (ref 11–51)

## 2018-09-19 MED ORDER — HYDRALAZINE HCL 20 MG/ML IJ SOLN
5.0000 mg | Freq: Four times a day (QID) | INTRAMUSCULAR | Status: DC | PRN
  Administered 2018-09-19 – 2018-09-21 (×4): 5 mg via INTRAVENOUS
  Filled 2018-09-19 (×4): qty 1

## 2018-09-19 MED ORDER — SODIUM CHLORIDE 0.9 % IV SOLN
INTRAVENOUS | Status: DC
  Administered 2018-09-19 (×2): via INTRAVENOUS

## 2018-09-19 MED ORDER — PROMETHAZINE HCL 12.5 MG RE SUPP: 12.5000 mg | Freq: Three times a day (TID) | RECTAL | Status: DC | PRN

## 2018-09-19 MED ORDER — FUROSEMIDE 40 MG PO TABS: 40.0000 mg | ORAL_TABLET | Freq: Every day | ORAL | Status: DC

## 2018-09-19 MED ORDER — DEXTROSE 50 % IV SOLN
25.0000 g | INTRAVENOUS | Status: AC
  Administered 2018-09-19: 25 g via INTRAVENOUS

## 2018-09-19 MED ORDER — INSULIN ASPART 100 UNIT/ML ~~LOC~~ SOLN
0.0000 [IU] | Freq: Three times a day (TID) | SUBCUTANEOUS | Status: DC
  Administered 2018-09-19: 1 [IU] via SUBCUTANEOUS
  Administered 2018-09-19: 5 [IU] via SUBCUTANEOUS
  Administered 2018-09-20: 9 [IU] via SUBCUTANEOUS

## 2018-09-19 MED ORDER — PROMETHAZINE HCL 12.5 MG RE SUPP
12.5000 mg | Freq: Three times a day (TID) | RECTAL | Status: DC | PRN
  Filled 2018-09-19: qty 1

## 2018-09-19 MED ORDER — INSULIN GLARGINE 100 UNIT/ML ~~LOC~~ SOLN
5.0000 [IU] | Freq: Every day | SUBCUTANEOUS | Status: DC
  Administered 2018-09-19 – 2018-09-20 (×2): 5 [IU] via SUBCUTANEOUS
  Filled 2018-09-19 (×2): qty 0.05

## 2018-09-19 MED ORDER — DEXTROSE 50 % IV SOLN
12.5000 g | INTRAVENOUS | Status: AC
  Administered 2018-09-19: 12.5 g via INTRAVENOUS
  Filled 2018-09-19: qty 50

## 2018-09-19 MED ORDER — TRAMADOL HCL 50 MG PO TABS
50.0000 mg | ORAL_TABLET | Freq: Four times a day (QID) | ORAL | Status: DC | PRN
  Administered 2018-09-19 – 2018-09-21 (×6): 50 mg via ORAL
  Filled 2018-09-19 (×7): qty 1

## 2018-09-19 MED ORDER — FUROSEMIDE 40 MG PO TABS
40.0000 mg | ORAL_TABLET | Freq: Every day | ORAL | Status: DC
  Administered 2018-09-19 – 2018-09-20 (×2): 40 mg via ORAL
  Filled 2018-09-19 (×2): qty 1

## 2018-09-19 MED ORDER — KETOROLAC TROMETHAMINE 30 MG/ML IJ SOLN
30.0000 mg | Freq: Four times a day (QID) | INTRAMUSCULAR | Status: DC | PRN
  Administered 2018-09-19 – 2018-09-21 (×5): 30 mg via INTRAVENOUS
  Filled 2018-09-19 (×6): qty 1

## 2018-09-19 MED ORDER — PROMETHAZINE HCL 25 MG PO TABS: 12.5000 mg | ORAL_TABLET | Freq: Three times a day (TID) | ORAL | Status: DC | PRN

## 2018-09-19 MED ORDER — LORAZEPAM 0.5 MG PO TABS
0.5000 mg | ORAL_TABLET | Freq: Two times a day (BID) | ORAL | Status: DC
  Administered 2018-09-19 – 2018-09-21 (×5): 0.5 mg via ORAL
  Filled 2018-09-19 (×5): qty 1

## 2018-09-19 MED ORDER — DICYCLOMINE HCL 20 MG PO TABS
20.0000 mg | ORAL_TABLET | Freq: Three times a day (TID) | ORAL | Status: DC | PRN
  Administered 2018-09-19 – 2018-09-20 (×2): 20 mg via ORAL
  Filled 2018-09-19 (×2): qty 1

## 2018-09-19 NOTE — Progress Notes (Signed)
Pt with  3 hypoglycemia episodes with BG below 70. Pt treated with D 50 per hypoglycemia protocol

## 2018-09-19 NOTE — Consult Note (Addendum)
Referring Provider: Dr. Aline August Primary Care Physician:  [name], MD Primary Gastroenterologist:  Dr. [place] Cellar   Reason for Consultation:  Nausea, Vomiting and Abdominal Pain   HPI: [name] is a 28 y.o. female with a PMH significant for depression, poorly controlled diabetes mellitus type 1, gastroparesis with recurrent nausea and vomiting resulting in DKA and frequent hospitalizations. Cholecystectomy in 2014. She presented to the emergency room 09/01/2018 with nausea, vomiting and high glucose levels but she signed out AMA. Her symptoms somewhat improved until Friday 09/17/2018. She presented to the ED 09/17/2018 with elevated glucose levels, upper abdominal pain and nausea/vomiting. She Received 2 L of normal saline IV. She was not in DKA. She was sent home. She returned back to the ED 09/18/2018 with persistent vomiting despite taking Phenergan suppository and worsening upper abdominal pain. She has chronic diarrhea, describes stool as brown loose to watery with mucous, sometimes black in color. No bright red rectal bleeding. Never has a formed stool. She smokes marijuana occasionally. Denies nasal or IV drug use. No alcohol use.  ED course 09/17/2018: 134.  Potassium 4.7.  Chloride 97.  Glucose 408.  BUN 11.  Creatinine 0.75.  Calcium 10.1.  Anion gap 15.  WBC 4.9.  Hemoglobin 11.3.  Hematocrit 34.4.  MCV 83.7.  Platelet 191.  Labs 09/18/2018: Sodium 137.  Potassium 4.0.  Glucose 120.  BUN 27.  Creatinine 1.46.  Anion gap 22.  WBC 6.4.  Hemoglobin 11.1.  Hematocrit 34.1.  UA with glucose greater than 500, leukocyte negative, hemoglobin moderate. Hepatic panel not done.  Abdominal/Pelvic CT was not done, history of numerous abd/pelvic CTs.  Currently, she complains of severe central upper abdominal and upper back pain with persistent nausea and vomiting, rates pain a 9 on scale of 1 to 10. Tramadol 50mg   Q 6 hrs in not reducing her pain. Nausea currently controlled on  Zofran 4mg  Q 6 hrs.  GI History:   EGD 10/05/2017: Esophagogastric landmarks identified. - Normal esophagus - A medium amount of food (residue) in the stomach consistent with gastroparesis - Normal stomach otherwise, no outlet obstruction, biopsies taken to rule out H pylori - Normal duodenal bulb and second portion of the duodenum.  Colonoscopy 06/27/3017 was normal.  Abdominal/Pelvic CT 06/19/2017:  Mild wall thickening throughout the colon likely mild acute colitis Changes over the subcutaneous region of the right gluteal region with surgical drain without significant change. 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  Abd/pelvic CT 03/23/2018 1. Distended stomach with ingested oral contrast. The possibility of gastroparesis or delayed gastric emptying is a possibility. 2. Mild transmural thickened appearance of the small and large intestine with mesenteric edema, nonspecific but consider inflammatory bowel disease, enterocolitis, hypoproteinemia, unlikely ischemia among some considerations. 3. Mild hepatic steatosis.  CTA abdomen/pelvic 12/23/2017 1. Persistent mild wall thickening suspected in the transverse colon. 2. Marked distention of the urinary bladder. 3. New body wall and mesenteric edema.   Past Medical History:  Diagnosis Date  . Allergy   . Anemia   . Anxiety   . Blood transfusion without reported diagnosis    Dec 2018  . Cataract    right eye  . Depression   . Diabetes type 1, uncontrolled ([place]) 11/14/2011   Since age 45  . Fibromyalgia   . Gastroparesis   . GERD (gastroesophageal reflux disease)   . Hypertension   . Infection    UTI April 2016  Past Surgical History:  Procedure Laterality Date  . ANKLE SURGERY    . CHOLECYSTECTOMY  11/15/2011   Procedure: LAPAROSCOPIC CHOLECYSTECTOMY WITH INTRAOPERATIVE CHOLANGIOGRAM;  Surgeon: [name], MD;  Location: [facility];   Service: General;  Laterality: N/A;  . COLONOSCOPY    . COLONOSCOPY WITH PROPOFOL N/A 06/27/2017   Procedure: COLONOSCOPY WITH PROPOFOL;  Surgeon: Rachael Fee, MD;  Location: [facility];  Service: Endoscopy;  Laterality: N/A;  . ESOPHAGOGASTRODUODENOSCOPY  12/03/2011   Procedure: ESOPHAGOGASTRODUODENOSCOPY (EGD);  Surgeon: Theda Belfast, MD;  Location: [facility];  Service: Endoscopy;  Laterality: N/A;  . FLEXIBLE SIGMOIDOSCOPY N/A 03/10/2017   Procedure: FLEXIBLE SIGMOIDOSCOPY;  Surgeon: Jeani Hawking, MD;  Location: [facility];  Service: Endoscopy;  Laterality: N/A;  . INCISION AND DRAINAGE PERIRECTAL ABSCESS N/A 03/01/2017   Procedure: IRRIGATION AND DEBRIDEMENT PERIRECTAL ABSCESS;  Surgeon: Ovidio Kin, MD;  Location: [facility];  Service: General;  Laterality: N/A;  . IRRIGATION AND DEBRIDEMENT BUTTOCKS N/A 03/23/2017   Procedure: IRRIGATION AND DEBRIDEMENT BUTTOCKS, SETON PLACEMENT;  Surgeon: Romie Levee, MD;  Location: [facility];  Service: General;  Laterality: N/A;  . LAPAROSCOPY  11/23/2011   Procedure: LAPAROSCOPY DIAGNOSTIC;  Surgeon: Mariella Saa, MD;  Location: [facility];  Service: General;  Laterality: N/A;  . SIGMOIDOSCOPY    . UPPER GASTROINTESTINAL ENDOSCOPY    . WISDOM TOOTH EXTRACTION      Prior to Admission medications   Medication Sig Start Date End Date Taking? Authorizing Provider  diclofenac (VOLTAREN) 75 MG EC tablet Take 1 tablet (75 mg total) by mouth 2 (two) times daily. Patient taking differently: Take 75 mg by mouth daily.  08/06/18  Yes Ardith Dark, MD  furosemide (LASIX) 40 MG tablet Take 1 tablet (40 mg total) by mouth daily. 07/20/18  Yes Armbruster, Willaim Rayas, MD  insulin lispro (HUMALOG) 100 UNIT/ML injection Inject 0.04 mLs (4 Units total) into the skin 3 (three) times daily with meals. Patient taking differently: Inject 0-10 Units into the skin See admin instructions. Inject 0-10 units subcutaneously three times daily with meals per sliding  scale 06/18/18  Yes Romero Belling, MD  LANTUS 100 UNIT/ML injection Inject 0.05 mLs (5 Units total) into the skin at bedtime. Patient using Vials Patient taking differently: Inject 8 Units into the skin at bedtime. Patient using Vials 06/18/18  Yes Romero Belling, MD  LORazepam (ATIVAN) 0.5 MG tablet TAKE 1 TABLET BY MOUTH  TWICE A DAY AS NEEDED FOR  ANXIETY Patient taking differently: Take 0.5 mg by mouth 2 (two) times daily as needed for anxiety.  08/19/18  Yes Ardith Dark, MD  metoCLOPramide (REGLAN) 10 MG tablet Take 1 tablet (10 mg total) by mouth every 6 (six) hours as needed for nausea or vomiting. 02/20/18  Yes Burnadette Pop, MD  mirtazapine (REMERON) 30 MG tablet Take 1 tablet (30 mg total) by mouth at bedtime. 05/31/18  Yes Ardith Dark, MD  pantoprazole (PROTONIX) 40 MG tablet Take 1 tablet (40 mg total) by mouth 2 (two) times daily. 06/10/18  Yes Sheikh, Omair Latif, DO  promethazine (PHENERGAN) 12.5 MG suppository Place 1 suppository (12.5 mg total) rectally every 8 (eight) hours as needed for nausea or vomiting. Note: Do not take while taking oral phenergan 09/18/18  Yes Melene Plan, DO  promethazine (PHENERGAN) 12.5 MG tablet TAKE 1 TABLET BY MOUTH  EVERY 8 HOURS AS NEEDED FOR NAUSEA AND VOMITING Patient taking differently: Take 12.5 mg by mouth every 8 (eight) hours as needed  for nausea or vomiting.  08/11/18  Yes Armbruster, Willaim RayasSteven P, MD  benzonatate (TESSALON PERLES) 100 MG capsule Take 1 capsule (100 mg total) by mouth 3 (three) times daily as needed. Patient not taking: Reported on 09/18/2018 08/13/18   Bennie PieriniMartin, Mary-Margaret, FNP  Continuous Blood Gluc Sensor (FREESTYLE LIBRE 14 DAY SENSOR) MISC 1 Device by Does not apply route every 14 (fourteen) days. 06/22/18   Ardith DarkParker, Caleb M, MD  dicyclomine (BENTYL) 20 MG tablet Take 1 tablet (20 mg total) by mouth 3 (three) times daily as needed for spasms (abdominal cramping). Patient not taking: Reported on 09/18/2018 06/03/18   Long, Arlyss RepressJoshua G,  MD  lidocaine (LIDODERM) 5 % Place 2 patches onto the skin daily. Remove & Discard patch within 12 hours or as directed by MD Patient not taking: Reported on 09/18/2018 08/03/18   Calvert Cantorizwan, Saima, MD  ondansetron (ZOFRAN) 4 MG tablet Take 1 tablet (4 mg total) by mouth every 8 (eight) hours as needed for nausea or vomiting (If necessary, dont combine with Reglan.). Patient not taking: Reported on 09/18/2018 03/25/18   Dimple NanasAmin, Ankit Chirag, MD    Current Facility-Administered Medications  Medication Dose Route Frequency Provider Last Rate Last Dose  . 0.9 %  sodium chloride infusion   Intravenous Continuous Opyd, Lavone Neriimothy S, MD   Stopped at 09/19/18 0041  . acetaminophen (TYLENOL) tablet 650 mg  650 mg Oral Q6H PRN Opyd, Lavone Neriimothy S, MD       Or  . acetaminophen (TYLENOL) suppository 650 mg  650 mg Rectal Q6H PRN Opyd, Lavone Neriimothy S, MD      . dextrose 5 %-0.45 % sodium chloride infusion   Intravenous Continuous Blount, Xenia T, NP 100 mL/hr at 09/19/18 1253    . dicyclomine (BENTYL) tablet 20 mg  20 mg Oral TID PRN Glade LloydAlekh, Kshitiz, MD   20 mg at 09/19/18 1044  . enoxaparin (LOVENOX) injection 40 mg  40 mg Subcutaneous Q24H Opyd, Lavone Neriimothy S, MD   40 mg at 09/18/18 0300  . furosemide (LASIX) tablet 40 mg  40 mg Oral Daily Hanley BenAlekh, Kshitiz, MD   40 mg at 09/19/18 1248  . hydrALAZINE (APRESOLINE) injection 5 mg  5 mg Intravenous Q6H PRN Alekh, Kshitiz, MD      . insulin aspart (novoLOG) injection 0-9 Units  0-9 Units Subcutaneous TID WC Glade LloydAlekh, Kshitiz, MD   1 Units at 09/19/18 1223  . insulin glargine (LANTUS) injection 5 Units  5 Units Subcutaneous Daily Glade LloydAlekh, Kshitiz, MD   5 Units at 09/19/18 1021  . insulin regular, human (MYXREDLIN) 100 units/ 100 mL infusion   Intravenous Continuous Opyd, Lavone Neriimothy S, MD   Stopped at 09/19/18 1024  . ketorolac (TORADOL) 30 MG/ML injection 30 mg  30 mg Intravenous Q6H PRN Glade LloydAlekh, Kshitiz, MD   30 mg at 09/19/18 1044  . LORazepam (ATIVAN) tablet 0.5 mg  0.5 mg Oral BID Glade LloydAlekh,  Kshitiz, MD   0.5 mg at 09/19/18 1042  . metoCLOPramide (REGLAN) injection 10 mg  10 mg Intravenous Q8H Opyd, Lavone Neriimothy S, MD   10 mg at 09/19/18 1223  . mirtazapine (REMERON) tablet 30 mg  30 mg Oral QHS Opyd, Lavone Neriimothy S, MD   30 mg at 09/18/18 2303  . ondansetron (ZOFRAN) tablet 4 mg  4 mg Oral Q6H PRN Opyd, Lavone Neriimothy S, MD       Or  . ondansetron (ZOFRAN) injection 4 mg  4 mg Intravenous Q6H PRN Opyd, Lavone Neriimothy S, MD   4 mg at 09/19/18 1250  .  pantoprazole (PROTONIX) EC tablet 40 mg  40 mg Oral BID Opyd, Lavone Neri, MD   40 mg at 09/19/18 0802  . promethazine (PHENERGAN) tablet 12.5 mg  12.5 mg Oral Q8H PRN Glade Lloyd, MD       Or  . promethazine (PHENERGAN) suppository 12.5 mg  12.5 mg Rectal Q8H PRN Alekh, Kshitiz, MD      . sodium chloride flush (NS) 0.9 % injection 3 mL  3 mL Intravenous Q12H Opyd, Lavone Neri, MD   3 mL at 09/19/18 0916  . traMADol (ULTRAM) tablet 50 mg  50 mg Oral Q6H PRN Glade Lloyd, MD   50 mg at 09/19/18 1250    Allergies as of 09/18/2018 - Review Complete 09/18/2018  Allergen Reaction Noted  . Other Anaphylaxis 12/13/2011  . Lactose intolerance (gi) Diarrhea 01/22/2015    Family History  Problem Relation Age of Onset  . Diabetes Mother   . Hypertension Father   . Colon cancer Paternal Grandmother        pt thinks PGM was dx in her 67's  . Diabetes Paternal Grandmother   . Diabetes Maternal Grandmother   . Diabetes Maternal Grandfather   . Diabetes Paternal Grandfather   . Diabetes Other   . Esophageal cancer Neg Hx   . Liver cancer Neg Hx   . Pancreatic cancer Neg Hx   . Stomach cancer Neg Hx   . Rectal cancer Neg Hx     Social History   Socioeconomic History  . Marital status: Married    Spouse name: sergio  . Number of children: 0  . Years of education: college  . Highest education level: Not on file  Occupational History  . Occupation: Equities trader call center    Employer: [employer]  Social Needs  . Financial resource strain:  Not on file  . Food insecurity    Worry: Not on file    Inability: Not on file  . Transportation needs    Medical: Not on file    Non-medical: Not on file  Tobacco Use  . Smoking status: Never Smoker  . Smokeless tobacco: Never Used  Substance and Sexual Activity  . Alcohol use: No    Alcohol/week: 0.0 standard drinks    Frequency: Never  . Drug use: No  . Sexual activity: Not Currently    Partners: Male    Birth control/protection: None  Lifestyle  . Physical activity    Days per week: Not on file    Minutes per session: Not on file  . Stress: Not on file  Relationships  . Social Musician on phone: Not on file    Gets together: Not on file    Attends religious service: Not on file    Active member of club or organization: Not on file    Attends meetings of clubs or organizations: Not on file    Relationship status: Not on file  . Intimate partner violence    Fear of current or ex partner: Not on file    Emotionally abused: Not on file    Physically abused: Not on file    Forced sexual activity: Not on file  Other Topics Concern  . Not on file  Social History Narrative  . Not on file    See HPI, all other systems reviewed were negative.  Physical Exam: Vital signs in last 24 hours: Temp:  [98.1 F (36.7 C)-99.1 F (37.3 C)] 99.1 F (37.3 C) (06/21 1225)  Pulse Rate:  [92-121] 105 (06/21 1225) Resp:  [13-22] 17 (06/21 0352) BP: (98-175)/(51-114) 175/103 (06/21 1225) SpO2:  [97 %-100 %] 100 % (06/21 1225) Weight:  [58.6 kg-59 kg] 58.6 kg (06/21 0500) Last BM Date: 09/18/18 General: alert, anxious in moderate pain Head:  Normocephalic and atraumatic. Eyes:  Sclera clear, no icterus. Conjunctiva pink. Ears:  Normal auditory acuity. Nose:  No deformity, discharge or lesions. Mouth: No deformity or lesions.   Neck:  Supple. Lungs: Clear throughout. Heart:  Tachycardic,  no murmur. Abdomen: Soft, moderate tenderness epigastric and RUQ without  rebound or guarding, now lower abdominal tenderness, + BS x 4 quads.  Rectal: Scant amount of green brown liquid stool in the rectum.  Msk:  Symmetrical without gross deformities. . Pulses:  Normal pulses noted. Extremities:  Without clubbing or edema. Neurologic:  Alert and  oriented x4;  grossly normal neurologically. Skin:  Intact without significant lesions or rashes.. Psych:  Alert and cooperative. Normal mood and affect.  Intake/Output from previous day: 06/20 0701 - 06/21 0700 In: 1953.7 [I.V.:853.7; IV Piggyback:1100] Out: -  Intake/Output this shift: Total I/O In: 480 [P.O.:480] Out: -   Lab Results: Recent Labs    09/17/18 2014 09/17/18 2033 09/18/18 1748 09/19/18 0641  WBC 4.9  --  6.4 6.5  HGB 11.3* 11.2* 11.1* 9.4*  HCT 34.4* 33.0* 34.1* 28.8*  PLT 191  --  226 200   BMET Recent Labs    09/19/18 0222 09/19/18 0641 09/19/18 1132  NA 142 137 137  K 3.5 3.5 3.5  CL 108 106 104  CO2 22 22 21*  GLUCOSE 82 201* 133*  BUN 24* 24* 20  CREATININE 0.89 0.83 0.70  CALCIUM 8.3* 8.4* 8.5*    Studies/Results: Dg Chest Portable 1 View  Result Date: 09/18/2018 CLINICAL DATA:  Chest pain, vomiting EXAM: PORTABLE CHEST 1 VIEW COMPARISON:  09/17/2018, 09/01/2018 FINDINGS: The heart size and mediastinal contours are within normal limits. Both lungs are clear. The visualized skeletal structures are unremarkable. IMPRESSION: No active disease. Electronically Signed   By: Jasmine PangKim  Fujinaga M.D.   On: 09/18/2018 17:51   Dg Chest Portable 1 View  Result Date: 09/17/2018 CLINICAL DATA:  DKA. Nausea and vomiting. EXAM: PORTABLE CHEST 1 VIEW COMPARISON:  09/01/2018 FINDINGS: The cardiomediastinal contours are normal. Unchanged patchy right infrahilar opacity, which on prior frontal and lateral views localized to the right middle lobe. No new airspace disease. Pulmonary vasculature is normal. No pleural effusion or pneumothorax. No acute osseous abnormalities are seen. IMPRESSION:  Patchy right infrahilar opacity is unchanged from radiographs earlier this month. No new abnormality. Electronically Signed   By: Narda RutherfordMelanie  Sanford M.D.   On: 09/17/2018 20:40    IMPRESSION/PLAN:  1. 28 y.o. female with DM I, gastroparesis with N/V, upper abdominal and upper back pain. -NPO -Zofran/Phenergan as ordered -consider abdominal imaging, Abdominal MRI to include small bowel MRI to evaluate the pancreas and rule out small bowel Crohn's vs CTAP, further recommendations per Dr. Russella DarStark. -add CRP, hepatic panel and lipase to labs drawn earlier today -FOBT -appreciate hospitalist's evaluation for pain management -DM management per hospitalist  2. AKI improving, Admission Cr. 1.46. Cr today 0.70.  -NS IV @ 100cc/hr  3. Tachcardia secondary to pain   Arnaldo NatalColleen M Kennedy-Smith NP 09/19/2018, 1:57 PM     Attending Physician Note   I have taken a history, examined the patient and reviewed the chart. I agree with the Advanced Practitioner's note, impression and recommendations.  *  Gastroparesis with upper abdominal pain, upper back pain, N/V. RN notes that the patient is comfortable, resting, sleeping in between staff and provider visits. [facility] mgmt of gastroparesis. Check LFTs, lipase today. Consider imaging studies as outlined above if symptoms persists.   * S/P cholecystectomy  * DM  * AKI resolved   Dr. Chales AbrahamsGupta covering [facility] hospital service starting Monday.   Claudette HeadMalcolm Sheyanne Munley, MD FACG [phone]

## 2018-09-19 NOTE — Progress Notes (Signed)
Patient's BP 175/103; HR 105. Dr. Starla Link paged and orders received. Will re-assess in an hour.   Patient has had a few episodes of vomitting; scheduled Reglan and PRN Zofran administered. Patient continues to be crying out in pain; PRN Tramadol also administered at this time. Will continue to monitor.   Hiram Comber, RN 09/19/2018 12:59 PM

## 2018-09-19 NOTE — Progress Notes (Addendum)
   09/19/18 2109  Vitals  Temp 98.6 F (37 C)  Temp Source Oral  BP (!) 101/55  MAP (mmHg) 69  BP Location Right Arm  BP Method Automatic  Patient Position (if appropriate) Lying  Pulse Rate (!) 120  Pulse Rate Source Monitor  Resp 18  Oxygen Therapy  SpO2 100 %  O2 Device Room Air  MEWS Score  MEWS RR 0  MEWS Pulse 2  MEWS Systolic 0  MEWS LOC 0  MEWS Temp 0  MEWS Score 2  MEWS Score Color Yellow    Patient  Crying while stating she feel she may pass out . She reports severe  back pain,  10/10 on pain scale. VSS. PRN Tramadol given @ 1944 and pt notified that IV Toradol will be given when it is due. Education on relaxation exercises given

## 2018-09-19 NOTE — Progress Notes (Signed)
CRITICAL VALUE ALERT  Critical Value:   BG 22  Date & Time Notied:  09/19/18  Provider Notified: Jeannette Corpus  Orders Received/Actions taken: 1 amp D 50 given

## 2018-09-19 NOTE — Progress Notes (Signed)
Hospitalist  On call paged with latest BMP results

## 2018-09-19 NOTE — Progress Notes (Signed)
Patient resting comfortably in between care. Upon entering room, patient states she's in 10/10 pain & starts crying uncontrollably. Continue to re-educate our current pain & nausea regimen. After exiting room, patient returns to sleep. Will continue to monitor.   Hiram Comber, RN 09/19/2018 2:12 PM

## 2018-09-19 NOTE — Progress Notes (Signed)
Patient screaming out in pain. Dr. Starla Link to bedside to assess patient. PRN Bentyl & Toradol as well as scheduled 0.5mg  Ativan administered. Two heat packs applied to back. Lights dimmed. Rest encouraged. Will continue to monitor.  Hiram Comber, RN 09/19/2018 10:56 AM

## 2018-09-19 NOTE — Progress Notes (Signed)
   09/18/18 2310  Vitals  Temp 98.8 F (37.1 C)  Temp Source Oral  BP 135/88  MAP (mmHg) 102  BP Location Right Arm  BP Method Automatic  Patient Position (if appropriate) Lying  Pulse Rate 98  Pulse Rate Source Monitor  Resp 18  Oxygen Therapy  SpO2 99 %  O2 Device Room Air  Pain Assessment  Pain Scale 0-10  Pain Score 10  Pain Type Chronic pain  Pain Location Back  Pain Orientation Lower  Pain Descriptors / Indicators Sharp  Pain Frequency Intermittent  Patients Stated Pain Goal 2  Pain Intervention(s) Medication (See eMAR);Heat applied  Height and Weight  Height 5\' 7"  (1.702 m)  Weight 58.6 kg  Type of Scale Used Standing  BSA (Calculated - sq m) 1.66 sq meters  BMI (Calculated) 20.23  Weight in (lb) to have BMI = 25 159.3  MEWS Score  MEWS RR 0  MEWS Pulse 0  MEWS Systolic 0  MEWS LOC 0  MEWS Temp 0  MEWS Score 0  MEWS Score Color Green   Assumed care of  Pt. Pt admitted  to 5W and oriented to unit and how to call for assistance. Fall education completed.  Insulin gtt was infusing @ 5.7 units /hr upon arrival at the unit.  Pt able to verbalize understanding to risks associated with falls.  Skin  Assessed with Lexine Baton RN and following skin concerns were noted:  -Stage 2 pressure ulcer( from previous fall) to L elbow.  - surgical scars to lower back possibly from abscess  To monitor and  treat  Pt per MD and Nursing orders

## 2018-09-19 NOTE — Progress Notes (Addendum)
Inpatient Diabetes Program Recommendations  AACE/ADA: New Consensus Statement on Inpatient Glycemic Control (2015)  Target Ranges:  Prepandial:   less than 140 mg/dL      Peak postprandial:   less than 180 mg/dL (1-2 hours)      Critically ill patients:  140 - 180 mg/dL   Lab Results  Component Value Date   GLUCAP 112 (H) 09/19/2018   HGBA1C 10.0 (H) 06/09/2018    Review of Glycemic Control Results for [name], [name] (MRN [id number]) as of 09/19/2018 09:30  Ref. Range 09/19/2018 07:07 09/19/2018 08:01 09/19/2018 09:19  Glucose-Capillary Latest Ref Range: 70 - 99 mg/dL 177 (H) 159 (H) 112 (H)   Diabetes history: DM 1 Outpatient Diabetes medications: Lantus 5 units, Humalog 4 units tid Current orders for Inpatient glycemic control: Lantus 5 units, Novolog 0-9 units tid   Inpatient Diabetes Program Recommendations:   Noted consult, was recently seen by DM coordinator on 08/20/2018.  Consider repeating A1C?   Patient is transitioning off glucostabilizer. Also, noted severe hypoglycemia while on Iv insulin. In agreement with current orders. Please ensure patient remains on glucostabilizer x 2 hours following basal dose before discontinuing Iv insulin. Will plan to see patient in AM on 6/22.   Per note from 5/21: "Patient sees Dr. [name], Endocrinologist outpatient for glucose control and last saw him in March. Patient reports having a sick day plan. Patient reports she is very sensitive to insulin and reports that most of the time when she feels sick she does take a portion of her long acting insulin."  Thanks, Bronson Curb, MSN, RNC-OB Diabetes Coordinator [phone] (8a-5p)

## 2018-09-19 NOTE — Progress Notes (Signed)
Patient ID: [name], female   DOB: 1990/09/23, 28 y.o.   MRN: [id number]  PROGRESS NOTE    [name]  NOM:[id number] DOB: May 14, 1990 DOA: 09/18/2018 PCP: Kathryn Barrack, MD   Brief Narrative:  28 year old female with history of diabetes mellitus type 1, diabetic gastroparesis, chronic pain, depression, GERD presented on 09/18/2018 with nausea, vomiting and abdominal pain.  In the ED, she was found to have DKA with acute kidney injury and started on IV fluids and insulin drip.  Assessment & Plan:   Principal Problem:   DKA, type 1 ([place]) Active Problems:   Chronic abdominal pain   Hypertension associated with diabetes ([place])   MDD (major depressive disorder), recurrent episode, mild ([place])   Acute kidney injury ([place])   Diabetic gastroparesis associated with type 1 diabetes mellitus ([place])   Intractable vomiting   Intractable vomiting with nausea  DKA in a patient with type 1 diabetes mellitus -A1c 10 in March 2020  -started on insulin drip on presentation.  Anion gap is closed.  We will switch to long-acting insulin and subsequently discontinue insulin drip.  Diabetes coordinator consult  -Try and advance diet if tolerated  Intractable nausea, vomiting with abdominal pain in a patient with chronic abdominal pain and diabetic gastroparesis -Patient has poorly controlled type 1 diabetes with gastroparesis.  She is supposed to have gastric pacer placed at [facility] at some point. -Patient has had issues with chronic pain with intermittent admissions to the hospital for the same and states that she is nontolerant to tramadol or Toradol and requests for Dilaudid.  She is currently on fentanyl.  GI in the past has recommended to avoid opiates including Dilaudid. -DC fentanyl.  Use Toradol for severe pain and tramadol for moderate pain. -Continue Reglan, Zofran and Phenergan suppository if needed. -Advance diet as tolerated. -I have requested GI evaluation.  Acute kidney  injury -From dehydration and DKA.  Improved.  Depression/anxiety -Continue Remeron and Ativan.  Outpatient follow-up   DVT prophylaxis: Lovenox Code Status: Full Family Communication: None Disposition Plan: Home in 1 to 2 days once clinically improved and cleared by GI  Consultants: GI  Procedures: None  Antimicrobials: None   Subjective: Patient seen and examined at bedside.  She is crying in pain.  She states that Toradol does not work.  She wants to at least keep her fentanyl for pain.  Still complains of nausea.  No overnight fever.  Objective: Vitals:   09/19/18 0352 09/19/18 0500 09/19/18 0803 09/19/18 1225  BP: (!) 166/95  (!) 149/87 (!) 175/103  Pulse: (!) 112  (!) 104 (!) 105  Resp: 17     Temp: 99.1 F (37.3 C)  98.8 F (37.1 C) 99.1 F (37.3 C)  TempSrc: Oral  Oral Oral  SpO2: 100%  98% 100%  Weight:  58.6 kg    Height:        Intake/Output Summary (Last 24 hours) at 09/19/2018 1400 Last data filed at 09/19/2018 1249 Gross per 24 hour  Intake 2433.65 ml  Output -  Net 2433.65 ml   Filed Weights   09/18/18 1717 09/18/18 2310 09/19/18 0500  Weight: 59 kg 58.6 kg 58.6 kg    Examination:  General exam: Appears mild to moderate distress secondary pain.  Looks older than stated age. Respiratory system: Bilateral decreased breath sounds at bases Cardiovascular system: S1 & S2 heard, tachycardic Gastrointestinal system: Abdomen is nondistended, soft and nontender. Normal bowel sounds heard. Extremities: No cyanosis, clubbing,  edema  Data Reviewed: I have personally reviewed following labs and imaging studies  CBC: Recent Labs  Lab 09/17/18 2014 09/17/18 2033 09/18/18 1748 09/19/18 0641  WBC 4.9  --  6.4 6.5  NEUTROABS 2.9  --  5.1 4.6  HGB 11.3* 11.2* 11.1* 9.4*  HCT 34.4* 33.0* 34.1* 28.8*  MCV 83.7  --  84.4 84.2  PLT 191  --  226 937   Basic Metabolic Panel: Recent Labs  Lab 09/18/18 1748 09/18/18 2143 09/19/18 0222 09/19/18 0641  09/19/18 1132  NA 137 140 142 137 137  K 4.0 3.8 3.5 3.5 3.5  CL 99 105 108 106 104  CO2 16* 20* 22 22 21*  GLUCOSE 420* 239* 82 201* 133*  BUN 27* 25* 24* 24* 20  CREATININE 1.46* 1.29* 0.89 0.83 0.70  CALCIUM 9.7 8.9 8.3* 8.4* 8.5*   GFR: Estimated Creatinine Clearance: 96 mL/min (by C-G formula based on SCr of 0.7 mg/dL). Liver Function Tests: No results for input(s): AST, ALT, ALKPHOS, BILITOT, PROT, ALBUMIN in the last 168 hours. No results for input(s): LIPASE, AMYLASE in the last 168 hours. No results for input(s): AMMONIA in the last 168 hours. Coagulation Profile: No results for input(s): INR, PROTIME in the last 168 hours. Cardiac Enzymes: No results for input(s): CKTOTAL, CKMB, CKMBINDEX, TROPONINI in the last 168 hours. BNP (last 3 results) No results for input(s): PROBNP in the last 8760 hours. HbA1C: No results for input(s): HGBA1C in the last 72 hours. CBG: Recent Labs  Lab 09/19/18 0801 09/19/18 0919 09/19/18 1021 09/19/18 1139 09/19/18 1218  GLUCAP 159* 112* 116* 119* 148*   Lipid Profile: No results for input(s): CHOL, HDL, LDLCALC, TRIG, CHOLHDL, LDLDIRECT in the last 72 hours. Thyroid Function Tests: No results for input(s): TSH, T4TOTAL, FREET4, T3FREE, THYROIDAB in the last 72 hours. Anemia Panel: No results for input(s): VITAMINB12, FOLATE, FERRITIN, TIBC, IRON, RETICCTPCT in the last 72 hours. Sepsis Labs: No results for input(s): PROCALCITON, LATICACIDVEN in the last 168 hours.  Recent Results (from the past 240 hour(s))  SARS Coronavirus 2 (CEPHEID - Performed in [place] hospital lab), Hosp Order     Status: None   Collection Time: 09/18/18  7:08 PM   Specimen: Nasopharyngeal Swab  Result Value Ref Range Status   SARS Coronavirus 2 NEGATIVE NEGATIVE Final    Comment: (NOTE) If result is NEGATIVE SARS-CoV-2 target nucleic acids are NOT DETECTED. The SARS-CoV-2 RNA is generally detectable in upper and lower  respiratory specimens  during the acute phase of infection. The lowest  concentration of SARS-CoV-2 viral copies this assay can detect is 250  copies / mL. A negative result does not preclude SARS-CoV-2 infection  and should not be used as the sole basis for treatment or other  patient management decisions.  A negative result may occur with  improper specimen collection / handling, submission of specimen other  than nasopharyngeal swab, presence of viral mutation(s) within the  areas targeted by this assay, and inadequate number of viral copies  (<250 copies / mL). A negative result must be combined with clinical  observations, patient history, and epidemiological information. If result is POSITIVE SARS-CoV-2 target nucleic acids are DETECTED. The SARS-CoV-2 RNA is generally detectable in upper and lower  respiratory specimens dur ing the acute phase of infection.  Positive  results are indicative of active infection with SARS-CoV-2.  Clinical  correlation with patient history and other diagnostic information is  necessary to determine patient infection status.  Positive  results do  not rule out bacterial infection or co-infection with other viruses. If result is PRESUMPTIVE POSTIVE SARS-CoV-2 nucleic acids MAY BE PRESENT.   A presumptive positive result was obtained on the submitted specimen  and confirmed on repeat testing.  While 2019 novel coronavirus  (SARS-CoV-2) nucleic acids may be present in the submitted sample  additional confirmatory testing may be necessary for epidemiological  and / or clinical management purposes  to differentiate between  SARS-CoV-2 and other Sarbecovirus currently known to infect humans.  If clinically indicated additional testing with an alternate test  methodology [phone]) is advised. The SARS-CoV-2 RNA is generally  detectable in upper and lower respiratory sp ecimens during the acute  phase of infection. The expected result is Negative. Fact Sheet for Patients:   StrictlyIdeas.no Fact Sheet for Healthcare Providers: BankingDealers.co.za This test is not yet approved or cleared by the [place] FDA and has been authorized for detection and/or diagnosis of SARS-CoV-2 by FDA under an Emergency Use Authorization (EUA).  This EUA will remain in effect (meaning this test can be used) for the duration of the COVID-19 declaration under Section 564(b)(1) of the Act, 21 U.S.C. section 360bbb-3(b)(1), unless the authorization is terminated or revoked sooner. Performed at [facility], [place] [address], [place], [place] [address]   MRSA PCR Screening     Status: None   Collection Time: 09/19/18  9:26 AM   Specimen: Nasal Mucosa; Nasopharyngeal  Result Value Ref Range Status   MRSA by PCR NEGATIVE NEGATIVE Final    Comment:        The GeneXpert MRSA Assay (FDA approved for NASAL specimens only), is one component of a comprehensive MRSA colonization surveillance program. It is not intended to diagnose MRSA infection nor to guide or monitor treatment for MRSA infections. Performed at [facility], [place] [address] [place], [place] [address]          Radiology Studies: Dg Chest Portable 1 View  Result Date: 09/18/2018 CLINICAL DATA:  Chest pain, vomiting EXAM: PORTABLE CHEST 1 VIEW COMPARISON:  09/17/2018, 09/01/2018 FINDINGS: The heart size and mediastinal contours are within normal limits. Both lungs are clear. The visualized skeletal structures are unremarkable. IMPRESSION: No active disease. Electronically Signed   By: Donavan Foil M.D.   On: 09/18/2018 17:51   Dg Chest Portable 1 View  Result Date: 09/17/2018 CLINICAL DATA:  DKA. Nausea and vomiting. EXAM: PORTABLE CHEST 1 VIEW COMPARISON:  09/01/2018 FINDINGS: The cardiomediastinal contours are normal. Unchanged patchy right infrahilar opacity, which on prior frontal and lateral views localized to the right middle lobe. No new  airspace disease. Pulmonary vasculature is normal. No pleural effusion or pneumothorax. No acute osseous abnormalities are seen. IMPRESSION: Patchy right infrahilar opacity is unchanged from radiographs earlier this month. No new abnormality. Electronically Signed   By: Keith Rake M.D.   On: 09/17/2018 20:40        Scheduled Meds: . enoxaparin (LOVENOX) injection  40 mg Subcutaneous Q24H  . furosemide  40 mg Oral Daily  . insulin aspart  0-9 Units Subcutaneous TID WC  . insulin glargine  5 Units Subcutaneous Daily  . LORazepam  0.5 mg Oral BID  . metoCLOPramide (REGLAN) injection  10 mg Intravenous Q8H  . mirtazapine  30 mg Oral QHS  . pantoprazole  40 mg Oral BID  . sodium chloride flush  3 mL Intravenous Q12H   Continuous Infusions: . sodium chloride Stopped (09/19/18 0041)  . dextrose 5 %  and 0.45% NaCl 100 mL/hr at 09/19/18 1253  . insulin Stopped (09/19/18 1024)     LOS: 0 days        Aline August, MD [facility] 09/19/2018, 2:00 PM

## 2018-09-19 NOTE — Progress Notes (Signed)
Dr. Starla Link made aware of latest BMP results; orders for lantus 5 units. Will administer when medication available & turn off insulin drip two hours after administration. Patient's diet advanced to carb modified and patient made aware. Per patient, she is not hungry & in too much pain to eat right now. Will continue to monitor.   Hiram Comber, RN 09/19/2018 9:58 AM

## 2018-09-19 NOTE — Progress Notes (Signed)
Patient's BP remains elevated; 175/109. PRN hydralazine administered. Will continue to monitor.  Hiram Comber, RN 09/19/2018 2:09 PM

## 2018-09-20 ENCOUNTER — Telehealth: Payer: Self-pay | Admitting: Gastroenterology

## 2018-09-20 DIAGNOSIS — G8929 Other chronic pain: Secondary | ICD-10-CM

## 2018-09-20 DIAGNOSIS — E1043 Type 1 diabetes mellitus with diabetic autonomic (poly)neuropathy: Secondary | ICD-10-CM

## 2018-09-20 DIAGNOSIS — E1010 Type 1 diabetes mellitus with ketoacidosis without coma: Principal | ICD-10-CM

## 2018-09-20 DIAGNOSIS — K3184 Gastroparesis: Secondary | ICD-10-CM

## 2018-09-20 DIAGNOSIS — R109 Unspecified abdominal pain: Secondary | ICD-10-CM

## 2018-09-20 DIAGNOSIS — R112 Nausea with vomiting, unspecified: Secondary | ICD-10-CM

## 2018-09-20 LAB — CBC WITH DIFFERENTIAL/PLATELET
Abs Immature Granulocytes: 0.02 10*3/uL (ref 0.00–0.07)
Basophils Absolute: 0 10*3/uL (ref 0.0–0.1)
Basophils Relative: 0 %
Eosinophils Absolute: 0 10*3/uL (ref 0.0–0.5)
Eosinophils Relative: 0 %
HCT: 31.6 % — ABNORMAL LOW (ref 36.0–46.0)
Hemoglobin: 10.2 g/dL — ABNORMAL LOW (ref 12.0–15.0)
Immature Granulocytes: 0 %
Lymphocytes Relative: 20 %
Lymphs Abs: 1 10*3/uL (ref 0.7–4.0)
MCH: 27.2 pg (ref 26.0–34.0)
MCHC: 32.3 g/dL (ref 30.0–36.0)
MCV: 84.3 fL (ref 80.0–100.0)
Monocytes Absolute: 0.2 10*3/uL (ref 0.1–1.0)
Monocytes Relative: 4 %
Neutro Abs: 3.7 10*3/uL (ref 1.7–7.7)
Neutrophils Relative %: 76 %
Platelets: 213 10*3/uL (ref 150–400)
RBC: 3.75 MIL/uL — ABNORMAL LOW (ref 3.87–5.11)
RDW: 13.2 % (ref 11.5–15.5)
WBC: 4.9 10*3/uL (ref 4.0–10.5)
nRBC: 0 % (ref 0.0–0.2)

## 2018-09-20 LAB — BASIC METABOLIC PANEL
Anion gap: 13 (ref 5–15)
BUN: 26 mg/dL — ABNORMAL HIGH (ref 6–20)
CO2: 22 mmol/L (ref 22–32)
Calcium: 8.4 mg/dL — ABNORMAL LOW (ref 8.9–10.3)
Chloride: 101 mmol/L (ref 98–111)
Creatinine, Ser: 1.48 mg/dL — ABNORMAL HIGH (ref 0.44–1.00)
GFR calc Af Amer: 55 mL/min — ABNORMAL LOW (ref >60)
GFR calc non Af Amer: 47 mL/min — ABNORMAL LOW (ref >60)
Glucose, Bld: 340 mg/dL — ABNORMAL HIGH (ref 70–99)
Potassium: 3 mmol/L — ABNORMAL LOW (ref 3.5–5.1)
Sodium: 136 mmol/L (ref 135–145)

## 2018-09-20 LAB — GLUCOSE, CAPILLARY
Glucose-Capillary: 385 mg/dL — ABNORMAL HIGH (ref 70–99)
Glucose-Capillary: 167 mg/dL — ABNORMAL HIGH (ref 70–99)
Glucose-Capillary: 134 mg/dL — ABNORMAL HIGH (ref 70–99)
Glucose-Capillary: 98 mg/dL (ref 70–99)

## 2018-09-20 LAB — UREA NITROGEN, URINE: Urea Nitrogen, Ur: 244 mg/dL

## 2018-09-20 LAB — URINE CULTURE: Culture: 60000 — AB

## 2018-09-20 LAB — MAGNESIUM: Magnesium: 1.5 mg/dL — ABNORMAL LOW (ref 1.7–2.4)

## 2018-09-20 MED ORDER — INSULIN GLARGINE 100 UNIT/ML ~~LOC~~ SOLN
5.0000 [IU] | Freq: Once | SUBCUTANEOUS | Status: DC
  Filled 2018-09-20: qty 0.05

## 2018-09-20 MED ORDER — INSULIN GLARGINE 100 UNIT/ML ~~LOC~~ SOLN
8.0000 [IU] | Freq: Every day | SUBCUTANEOUS | Status: DC
  Filled 2018-09-20: qty 0.08

## 2018-09-20 MED ORDER — SODIUM CHLORIDE 0.9% FLUSH: 10.0000 mL | INTRAVENOUS | Status: DC | PRN

## 2018-09-20 MED ORDER — POTASSIUM CHLORIDE IN NACL 40-0.9 MEQ/L-% IV SOLN
INTRAVENOUS | Status: DC
  Administered 2018-09-20 (×2): 100 mL/h via INTRAVENOUS
  Filled 2018-09-20 (×4): qty 1000

## 2018-09-20 MED ORDER — MAGNESIUM SULFATE 2 GM/50ML IV SOLN
2.0000 g | Freq: Once | INTRAVENOUS | Status: AC
  Administered 2018-09-20: 2 g via INTRAVENOUS
  Filled 2018-09-20: qty 50

## 2018-09-20 MED ORDER — SODIUM CHLORIDE 0.9% FLUSH: 10.0000 mL | Freq: Two times a day (BID) | INTRAVENOUS | Status: DC

## 2018-09-20 MED ORDER — INSULIN ASPART 100 UNIT/ML ~~LOC~~ SOLN
0.0000 [IU] | SUBCUTANEOUS | Status: DC
  Administered 2018-09-20: 2 [IU] via SUBCUTANEOUS
  Administered 2018-09-20: 1 [IU] via SUBCUTANEOUS
  Administered 2018-09-21: 2 [IU] via SUBCUTANEOUS
  Administered 2018-09-21: 1 [IU] via SUBCUTANEOUS

## 2018-09-20 MED ORDER — ENOXAPARIN SODIUM 30 MG/0.3ML ~~LOC~~ SOLN
30.0000 mg | SUBCUTANEOUS | Status: DC
  Administered 2018-09-20: 30 mg via SUBCUTANEOUS
  Filled 2018-09-20: qty 0.3

## 2018-09-20 NOTE — Progress Notes (Addendum)
Patient ID: [name], female   DOB: January 15, 1991, 28 y.o.   MRN: [id number]  PROGRESS NOTE    [name]  MBT:[id number] DOB: Jul 22, 1990 DOA: 09/18/2018 PCP: [name], MD   Brief Narrative:  28 year old female with history of diabetes mellitus type 1, diabetic gastroparesis, chronic pain, depression, GERD presented on 09/18/2018 with nausea, vomiting and abdominal pain.  In the ED, she was found to have DKA with acute kidney injury and started on IV fluids and insulin drip.  Assessment & Plan:   Principal Problem:   DKA, type 1 ([place]) Active Problems:   Chronic abdominal pain   Hypertension associated with diabetes ([place])   MDD (major depressive disorder), recurrent episode, mild ([place])   Acute kidney injury ([place])   Diabetic gastroparesis associated with type 1 diabetes mellitus ([place])   Intractable vomiting   Intractable vomiting with nausea  DKA in a patient with type 1 diabetes mellitus -A1c 10 in March 2020  -started on insulin drip on presentation.  After anion gap closed, subsequently switched to long-acting insulin.   -Diabetes coronary following.  Follow hemoglobin A1c. -Blood sugars elevated this morning.  Increase Lantus to 8 units subcutaneous daily.  Continue CBGs with SSI. -Oral intake still poor.  Advance diet as tolerated.  Intractable nausea, vomiting with abdominal pain in a patient with chronic abdominal pain and diabetic gastroparesis -Patient has poorly controlled type 1 diabetes with gastroparesis.  She is supposed to have gastric pacer placed at [facility] at some point. -Patient has had issues with chronic pain with intermittent admissions to the hospital for the same and states that she is nontolerant to tramadol or Toradol and requests for Dilaudid/fentanyl.  Fentanyl was discontinued on 09/20/2018.  GI in the past has recommended to avoid opiates including Dilaudid. -Use Toradol for severe pain and tramadol for moderate pain. -Continue  Reglan, Zofran and Phenergan suppository if needed. -Advance diet as tolerated. -GI evaluation appreciated. -Patient was trying again this morning and kept asking me why she is not getting fentanyl or Dilaudid and she stated that she usually gets these while she is in the hospital. -As per the nursing staff, if someone is not in the room, patient is mostly sleeping without any distress but as soon as someone walks in the room, she starts crying. -Spoke to GI/Dr. Silverio Decamp on phone and at this point, will request palliative care consultation for help in managing her chronic pain.  Would still like to avoid Dilaudid/narcotics as much as possible.  If pain still does not improve, then only we will consider repeat imaging.  Acute kidney injury -From dehydration and DKA.  Creatinine has worsened today.  Her oral intake is still poor.  We will put her back on IV fluids.  Hypokalemia -replace with IV fluids  Hypomagnesemia-replace.  Repeat a.m. labs  Depression/anxiety -Continue Remeron and Ativan.  Outpatient follow-up   DVT prophylaxis: Lovenox Code Status: Full Family Communication: None Disposition Plan: Home in 1 to 2 days once clinically improved and cleared by GI  Consultants: GI  Procedures: None  Antimicrobials: None   Subjective: Patient seen and examined at bedside.  She is again crying in pain and asking for Dilaudid/fentanyl and states that she cannot tolerate it anymore.  Her oral intake is poor as per nursing staff.  No overnight fever.  As per nursing staff, she was mostly sleepy during the day yesterday when no one was in the room and as soon as someone walked in  the room, she would start crying in pain. Objective: Vitals:   09/20/18 0433 09/20/18 0632 09/20/18 0901 09/20/18 1130  BP: (!) 174/105 (!) 114/56 118/65 (!) 161/96  Pulse: (!) 120 (!) 126 (!) 124 (!) 120  Resp: 20 18    Temp: 98.7 F (37.1 C) 98.7 F (37.1 C)    TempSrc: Oral Oral    SpO2: 100% 99%     Weight:      Height:        Intake/Output Summary (Last 24 hours) at 09/20/2018 1153 Last data filed at 09/20/2018 0901 Gross per 24 hour  Intake 3652.75 ml  Output -  Net 3652.75 ml   Filed Weights   09/18/18 1717 09/18/18 2310 09/19/18 0500  Weight: 59 kg 58.6 kg 58.6 kg    Examination:  General exam: Appears mild to moderate distress secondary to pain.  Very poor historian.  Looks older than stated age.   Respiratory system: Bilateral decreased breath sounds at bases, no wheezing Cardiovascular system: Tachycardic, S1-S2 heard Gastrointestinal system: Abdomen is nondistended, soft and nontender. Normal bowel sounds heard. Extremities: No cyanosis,  edema  Data Reviewed: I have personally reviewed following labs and imaging studies  CBC: Recent Labs  Lab 09/17/18 2014 09/17/18 2033 09/18/18 1748 09/19/18 0641 09/20/18 0942  WBC 4.9  --  6.4 6.5 4.9  NEUTROABS 2.9  --  5.1 4.6 3.7  HGB 11.3* 11.2* 11.1* 9.4* 10.2*  HCT 34.4* 33.0* 34.1* 28.8* 31.6*  MCV 83.7  --  84.4 84.2 84.3  PLT 191  --  226 200 024   Basic Metabolic Panel: Recent Labs  Lab 09/18/18 2143 09/19/18 0222 09/19/18 0641 09/19/18 1132 09/20/18 0942  NA 140 142 137 137 136  K 3.8 3.5 3.5 3.5 3.0*  CL 105 108 106 104 101  CO2 20* 22 22 21* 22  GLUCOSE 239* 82 201* 133* 340*  BUN 25* 24* 24* 20 26*  CREATININE 1.29* 0.89 0.83 0.70 1.48*  CALCIUM 8.9 8.3* 8.4* 8.5* 8.4*  MG  --   --   --   --  1.5*   GFR: Estimated Creatinine Clearance: 51.9 mL/min (A) (by C-G formula based on SCr of 1.48 mg/dL (H)). Liver Function Tests: Recent Labs  Lab 09/19/18 1558  AST 22  ALT 43  ALKPHOS 89  BILITOT 0.8  PROT 6.7  ALBUMIN 2.8*   Recent Labs  Lab 09/19/18 1558  LIPASE 39   No results for input(s): AMMONIA in the last 168 hours. Coagulation Profile: No results for input(s): INR, PROTIME in the last 168 hours. Cardiac Enzymes: No results for input(s): CKTOTAL, CKMB, CKMBINDEX, TROPONINI in  the last 168 hours. BNP (last 3 results) No results for input(s): PROBNP in the last 8760 hours. HbA1C: No results for input(s): HGBA1C in the last 72 hours. CBG: Recent Labs  Lab 09/19/18 1218 09/19/18 1624 09/19/18 2117 09/20/18 0837 09/20/18 1132  GLUCAP 148* 256* 294* 385* 167*   Lipid Profile: No results for input(s): CHOL, HDL, LDLCALC, TRIG, CHOLHDL, LDLDIRECT in the last 72 hours. Thyroid Function Tests: No results for input(s): TSH, T4TOTAL, FREET4, T3FREE, THYROIDAB in the last 72 hours. Anemia Panel: No results for input(s): VITAMINB12, FOLATE, FERRITIN, TIBC, IRON, RETICCTPCT in the last 72 hours. Sepsis Labs: No results for input(s): PROCALCITON, LATICACIDVEN in the last 168 hours.  Recent Results (from the past 240 hour(s))  Novel Coronavirus,NAA,(SEND-OUT TO REF LAB - TAT 24-48 hrs); Hosp Order     Status: None  Collection Time: 09/17/18  9:50 PM   Specimen: Nasopharyngeal Swab; Respiratory  Result Value Ref Range Status   SARS-CoV-2, NAA NOT DETECTED NOT DETECTED Final    Comment: (NOTE) This test was developed and its performance characteristics determined by [employer]. This test has not been FDA cleared or approved. This test has been authorized by FDA under an Emergency Use Authorization (EUA). This test is only authorized for the duration of time the declaration that circumstances exist justifying the authorization of the emergency use of in vitro diagnostic tests for detection of SARS-CoV-2 virus and/or diagnosis of COVID-19 infection under section 564(b)(1) of the Act, 21 U.S.C. 458KDX-8(P)(3), unless the authorization is terminated or revoked sooner. When diagnostic testing is negative, the possibility of a false negative result should be considered in the context of a patient's recent exposures and the presence of clinical signs and symptoms consistent with COVID-19. An individual without symptoms of COVID-19 and who is not shedding  SARS-CoV-2 virus would expect to have a negative (not detected) result in this assay. Performed  At: [facility] [address] [place], [place] [id number] Rush Farmer MD BH:[id number]    [place]  Final    Comment: Performed at [facility], [place] [address] [place], [place] [address]  SARS Coronavirus 2 (CEPHEID - Performed in [place] hospital lab), Hosp Order     Status: None   Collection Time: 09/18/18  7:08 PM   Specimen: Nasopharyngeal Swab  Result Value Ref Range Status   SARS Coronavirus 2 NEGATIVE NEGATIVE Final    Comment: (NOTE) If result is NEGATIVE SARS-CoV-2 target nucleic acids are NOT DETECTED. The SARS-CoV-2 RNA is generally detectable in upper and lower  respiratory specimens during the acute phase of infection. The lowest  concentration of SARS-CoV-2 viral copies this assay can detect is 250  copies / mL. A negative result does not preclude SARS-CoV-2 infection  and should not be used as the sole basis for treatment or other  patient management decisions.  A negative result may occur with  improper specimen collection / handling, submission of specimen other  than nasopharyngeal swab, presence of viral mutation(s) within the  areas targeted by this assay, and inadequate number of viral copies  (<250 copies / mL). A negative result must be combined with clinical  observations, patient history, and epidemiological information. If result is POSITIVE SARS-CoV-2 target nucleic acids are DETECTED. The SARS-CoV-2 RNA is generally detectable in upper and lower  respiratory specimens dur ing the acute phase of infection.  Positive  results are indicative of active infection with SARS-CoV-2.  Clinical  correlation with patient history and other diagnostic information is  necessary to determine patient infection status.  Positive results do  not rule out bacterial infection or co-infection with other viruses. If result is  PRESUMPTIVE POSTIVE SARS-CoV-2 nucleic acids MAY BE PRESENT.   A presumptive positive result was obtained on the submitted specimen  and confirmed on repeat testing.  While 2019 novel coronavirus  (SARS-CoV-2) nucleic acids may be present in the submitted sample  additional confirmatory testing may be necessary for epidemiological  and / or clinical management purposes  to differentiate between  SARS-CoV-2 and other Sarbecovirus currently known to infect humans.  If clinically indicated additional testing with an alternate test  methodology [phone] is advised. The SARS-CoV-2 RNA is generally  detectable in upper and lower respiratory sp ecimens during the acute  phase of infection. The expected result is Negative. Fact Sheet  for Patients:  StrictlyIdeas.no Fact Sheet for Healthcare Providers: BankingDealers.co.za This test is not yet approved or cleared by the [place] FDA and has been authorized for detection and/or diagnosis of SARS-CoV-2 by FDA under an Emergency Use Authorization (EUA).  This EUA will remain in effect (meaning this test can be used) for the duration of the COVID-19 declaration under Section 564(b)(1) of the Act, 21 U.S.C. section 360bbb-3(b)(1), unless the authorization is terminated or revoked sooner. Performed at [facility], [place] [address] [place], [place] [address]   MRSA PCR Screening     Status: None   Collection Time: 09/19/18  9:26 AM   Specimen: Nasal Mucosa; Nasopharyngeal  Result Value Ref Range Status   MRSA by PCR NEGATIVE NEGATIVE Final    Comment:        The GeneXpert MRSA Assay (FDA approved for NASAL specimens only), is one component of a comprehensive MRSA colonization surveillance program. It is not intended to diagnose MRSA infection nor to guide or monitor treatment for MRSA infections. Performed at [facility], [place] [address] [place],  [address]           Radiology Studies: Dg Chest Portable 1 View  Result Date: 09/18/2018 CLINICAL DATA:  Chest pain, vomiting EXAM: PORTABLE CHEST 1 VIEW COMPARISON:  09/17/2018, 09/01/2018 FINDINGS: The heart size and mediastinal contours are within normal limits. Both lungs are clear. The visualized skeletal structures are unremarkable. IMPRESSION: No active disease. Electronically Signed   By: Donavan Foil M.D.   On: 09/18/2018 17:51        Scheduled Meds: . enoxaparin (LOVENOX) injection  40 mg Subcutaneous Q24H  . furosemide  40 mg Oral Daily  . insulin aspart  0-9 Units Subcutaneous Q4H  . insulin glargine  5 Units Subcutaneous Once  . [START ON 09/21/2018] insulin glargine  8 Units Subcutaneous Daily  . LORazepam  0.5 mg Oral BID  . metoCLOPramide (REGLAN) injection  10 mg Intravenous Q8H  . mirtazapine  30 mg Oral QHS  . pantoprazole  40 mg Oral BID  . sodium chloride flush  3 mL Intravenous Q12H   Continuous Infusions: . 0.9 % NaCl with KCl 40 mEq / L    . magnesium sulfate bolus IVPB 2 g (09/20/18 1130)     LOS: 1 day        Aline August, MD [facility] 09/20/2018, 11:53 AM

## 2018-09-20 NOTE — Progress Notes (Addendum)
Daily Rounding Note  09/20/2018, 12:17 PM  LOS: 1 day   SUBJECTIVE:   Chief complaint: Abdominal and back pain.  Severe pain.  Getting Tramadol and Toradol.  Pt crying with pain when RN enters room but "resting comfortably in between care". Pt called GI office to obtain Rx for pain meds.   The only thing that seems to help is Dilaudid and Ativan. She is getting scheduled IV Reglan. Last time she vomited was the wee hours this morning, small amount, not bloody.  Not currently nauseated just having the pain. Stools are loose.  OBJECTIVE:         Vital signs in last 24 hours:    Temp:  [98.1 F (36.7 C)-99.1 F (37.3 C)] 98.7 F (37.1 C) (06/22 7353) Pulse Rate:  [103-126] 120 (06/22 1130) Resp:  [17-20] 18 (06/22 0632) BP: (101-175)/(55-109) 161/96 (06/22 1130) SpO2:  [99 %-100 %] 99 % (06/22 0632) Last BM Date: 09/20/18 Filed Weights   09/18/18 1717 09/18/18 2310 09/19/18 0500  Weight: 59 kg 58.6 kg 58.6 kg   General: Looks somewhat chronically ill and uncomfortable. Heart: RRR. Chest: Clear bilaterally.  No labored breathing or cough. Abdomen: Not particularly tender.  Bowel sounds hypoactive.  No distention. Extremities: Slight lower extremity edema without pitting on the feet. Neuro/Psych: Alert and oriented x3.  Moaning, rocking back and forth and in a curled up position with her pain.  Intake/Output from previous day: 06/21 0701 - 06/22 0700 In: 2692.8 [P.O.:480; I.V.:2212.8] Out: -   Intake/Output this shift: Total I/O In: 960 [P.O.:960] Out: -   Lab Results: Recent Labs    09/18/18 1748 09/19/18 0641 09/20/18 0942  WBC 6.4 6.5 4.9  HGB 11.1* 9.4* 10.2*  HCT 34.1* 28.8* 31.6*  PLT 226 200 213   BMET Recent Labs    09/19/18 0641 09/19/18 1132 09/20/18 0942  NA 137 137 136  K 3.5 3.5 3.0*  CL 106 104 101  CO2 22 21* 22  GLUCOSE 201* 133* 340*  BUN 24* 20 26*  CREATININE 0.83 0.70  1.48*  CALCIUM 8.4* 8.5* 8.4*   LFT Recent Labs    09/19/18 1558  PROT 6.7  ALBUMIN 2.8*  AST 22  ALT 43  ALKPHOS 89  BILITOT 0.8  BILIDIR 0.2  IBILI 0.6   PT/INR No results for input(s): LABPROT, INR in the last 72 hours. Hepatitis Panel No results for input(s): HEPBSAG, HCVAB, HEPAIGM, HEPBIGM in the last 72 hours.  Studies/Results: Dg Chest Portable 1 View  Result Date: 09/18/2018 CLINICAL DATA:  Chest pain, vomiting EXAM: PORTABLE CHEST 1 VIEW COMPARISON:  09/17/2018, 09/01/2018 FINDINGS: The heart size and mediastinal contours are within normal limits. Both lungs are clear. The visualized skeletal structures are unremarkable. IMPRESSION: No active disease. Electronically Signed   By: Donavan Foil M.D.   On: 09/18/2018 17:51    ASSESMENT:   *   Acute flare of long standing diabetic gastroparesis, dates to at least 2013.  .  Nausea, vomiting, upper abdominal and pain. Takes Voltaren 75 mg/day at home.   Bilirubin, transaminases, alkaline phosphatase, lipase normal. 09/2017 EGD for chronic diarrhea, chronic nausea vomiting, suspected gastroparesis.  Retined food in stomach c/w gastroparesis.  Gastric and duodenal mucosa grossly normal, biopsies obtained. Gastric biopsies showed chronic, inactive gastritis, no H. pylori.  S/P biopsies normal.  No villous atrophy. 05/2017 Colonoscopy: For evaluation of possible colitis seen on CT.  At the time of the  colonoscopy she had recently been discharged from Dr. Collene Mares and [facility] practice.  She had been in the ED 95 times in the past 6 to 7 years with GI symptoms, 31 visits within the past couple of years.  Distal ileum normal.  Entire colon normal.  Random colon biopsies obtained were all normal, no evidence of microscopic colitis. Met with Dr Jerene Pitch at [facility] in 06/21/2018 re gastric pacemaker.  Planned electrogastrogram, but inusurance coverage for this needs to be obtained.  Mentioned but not pursued was repeat nuc med gastric emptying study.   Plan was to discuss case with Dr Derrill Kay.    *    Hypoalbuminemia, suspect some element of protein malnutrition.  *    Normocytic anemia.  *    DKA in type I diabetic.  *  SB and colonic thickening on CT.  Thickened colon on CTs in 05/2017 and 11/2017.  Normal colonoscopy in 05/2017.      PLAN   *   Generally avoid narcotics, so no narcotics prescriptions when she does discharge.  However consider limited dilaudid, ativan while inpt.   Continue IV Reglan for now along with twice daily oral Protonix.  *  Needs to get back to gastroparesis specialist at [facility], testing and office visits had been on hold due to Covid.      Azucena Freed  09/20/2018, 12:17 PM Phone [phone]   Attending physician's note   I have taken an interval history, reviewed the chart and examined the patient. I agree with the Advanced Practitioner's note, impression and recommendations.   Patient was sleeping earlier, was difficult to arouse with verbal command. When I went in later along with Sarah, was crying complaining of severe pain. Intermittent vomiting, says it has been a while since she threw up.  She is not taking much p.o. intake.  She is requesting IV Dilaudid and Ativan, states "she usually feels much better after she gets Dilaudid, is able to eat and get discharged" Okay to use low-dose Ativan as needed, will help with nausea.  Continue to limit use of narcotics. Slowly advance diet as tolerated Avoid hyper or hypoglycemic episodes, try to maintain tighter glycemic control Follow-up on discharge at [facility], in the process of being evaluated for possible gastric stimulator placement. Continue supportive care Repeat imaging likely will be low yield, if continues to have persistent abdominal pain with no improvement despite supportive care, can consider repeat CT abdomen pelvis  K. Denzil Magnuson , MD [phone]

## 2018-09-20 NOTE — Progress Notes (Signed)
Offered to update patient's husband about plan of care; patient declined. Stated she will update as needed. Will continue to monitor.   Hiram Comber, RN 09/20/2018 12:59 PM

## 2018-09-20 NOTE — Telephone Encounter (Signed)
Patient called said that she is at the hosp and they told her to call our office to get approval from Dr. Havery Moros so that she can get consent to get pain medication.

## 2018-09-20 NOTE — Progress Notes (Signed)
Responded to PIV consult. Midline placed per protocol. Current infiltration in L arm per RN. Informed RN if any future additional access needed, possible CVAD placement should be discussed with MD due to limited veinous access with multiple PIV's this admission.

## 2018-09-20 NOTE — Telephone Encounter (Signed)
Called patient back, she is in the hospital, and wanted Dr. Havery Moros to order pain med.for her. I told her while she was in the hospital it would be her Dr. At the hospital that would order any necessary pain med. She should let her nurse know if she is in pain.

## 2018-09-20 NOTE — Progress Notes (Addendum)
Inpatient Diabetes Program Recommendations  AACE/ADA: New Consensus Statement on Inpatient Glycemic Control (2015)  Target Ranges:  Prepandial:   less than 140 mg/dL      Peak postprandial:   less than 180 mg/dL (1-2 hours)      Critically ill patients:  140 - 180 mg/dL   Lab Results  Component Value Date   GLUCAP 385 (H) 09/20/2018   HGBA1C 10.0 (H) 06/09/2018    Review of Glycemic Control Results for [name], [name] (MRN [id number]) as of 09/20/2018 09:39  Ref. Range 09/19/2018 16:24 09/19/2018 21:17 09/20/2018 08:37  Glucose-Capillary Latest Ref Range: 70 - 99 mg/dL 256 (H) 294 (H) 385 (H)   Diabetes history:DM 1 Outpatient Diabetes medications:Lantus 5 units, Humalog 4 units tid Current orders for Inpatient glycemic control:Lantus 5 units, Novolog 0-9 units tid   Inpatient Diabetes Program Recommendations: Noted consult, was recently seen by DM coordinator on 08/20/2018.   Trends are significantly increased with FSBG of 385 mg/dL this AM. Consider: - Adding a BMET  - Increasing Lantus to 8 units QD - Since patient continues with poor oral intake switch correction to Novolog 0-9 units Q4H. - Repeating A1C?   Addendum: Spoke with patient regarding outpatient diabetes control. Patient is seen by Dr [name], endocrinology. Patient verifies home regimen, however, on sick days alters her dosages in fears of low. Per Dr Cordelia Pen previous note, patient is sensitive to insulin and high risk of having hypoglycemia. Patient reports being alone a lot and is scared. On sick days patient confirms administering basal but only a max of 2 units of Lantus and then may not correct due to fear of lows.  Reviewed patient's previous A1c of 10.0% and told another lab was pending. Explained what a A1c is and what it measures. Also reviewed goal A1c with patient, importance of good glucose control @ home, and blood sugar goals. Reviewed patho of DM, need for insulin, DKA, watching for urine ketones,  vascular changes, and commobidities.  Patient has a meter and supplies. Reports checking 3 times per day and was checking Q4 hours on sick days. Encouraged to take basal insulin dose even on sick days, continue checking CBGs Q4H using meter, and reach out to endo for additional changes specific to sick days (additional correction per SSI or addition to sick day plan). Patient has no further questions at this time.  Thanks, Bronson Curb, MSN, RNC-OB Diabetes Coordinator [phone] (8a-5p)

## 2018-09-21 LAB — GLUCOSE, CAPILLARY
Glucose-Capillary: 104 mg/dL — ABNORMAL HIGH (ref 70–99)
Glucose-Capillary: 126 mg/dL — ABNORMAL HIGH (ref 70–99)
Glucose-Capillary: 157 mg/dL — ABNORMAL HIGH (ref 70–99)
Glucose-Capillary: 82 mg/dL (ref 70–99)

## 2018-09-21 LAB — BASIC METABOLIC PANEL
Anion gap: 7 (ref 5–15)
BUN: 20 mg/dL (ref 6–20)
CO2: 25 mmol/L (ref 22–32)
Calcium: 8.2 mg/dL — ABNORMAL LOW (ref 8.9–10.3)
Chloride: 101 mmol/L (ref 98–111)
Creatinine, Ser: 0.85 mg/dL (ref 0.44–1.00)
GFR calc Af Amer: >60 mL/min (ref >60)
GFR calc non Af Amer: >60 mL/min (ref >60)
Glucose, Bld: 92 mg/dL (ref 70–99)
Potassium: 3.2 mmol/L — ABNORMAL LOW (ref 3.5–5.1)
Sodium: 133 mmol/L — ABNORMAL LOW (ref 135–145)

## 2018-09-21 LAB — HEMOGLOBIN A1C
Hgb A1c MFr Bld: 9.6 % — ABNORMAL HIGH (ref 4.8–5.6)
Mean Plasma Glucose: 229 mg/dL

## 2018-09-21 MED ORDER — DICYCLOMINE HCL 20 MG PO TABS
20.0000 mg | ORAL_TABLET | Freq: Three times a day (TID) | ORAL | Status: DC
  Administered 2018-09-21: 20 mg via ORAL
  Filled 2018-09-21: qty 1

## 2018-09-21 MED ORDER — ONDANSETRON HCL 4 MG PO TABS: 4.0000 mg | ORAL_TABLET | Freq: Three times a day (TID) | ORAL | 0 refills | Status: DC | PRN

## 2018-09-21 MED ORDER — INSULIN GLARGINE 100 UNIT/ML ~~LOC~~ SOLN
5.0000 [IU] | Freq: Every day | SUBCUTANEOUS | Status: DC
  Administered 2018-09-21: 5 [IU] via SUBCUTANEOUS
  Filled 2018-09-21: qty 0.05

## 2018-09-21 MED ORDER — INSULIN LISPRO 100 UNIT/ML ~~LOC~~ SOLN: 0.0000 [IU] | SUBCUTANEOUS | Status: DC

## 2018-09-21 MED ORDER — DICLOFENAC SODIUM 75 MG PO TBEC: 75.0000 mg | DELAYED_RELEASE_TABLET | Freq: Every day | ORAL | Status: DC

## 2018-09-21 MED ORDER — LANTUS 100 UNIT/ML ~~LOC~~ SOLN: 8.0000 [IU] | Freq: Every day | SUBCUTANEOUS | Status: DC

## 2018-09-21 MED ORDER — POTASSIUM CHLORIDE CRYS ER 20 MEQ PO TBCR
40.0000 meq | EXTENDED_RELEASE_TABLET | Freq: Once | ORAL | Status: AC
  Administered 2018-09-21: 40 meq via ORAL
  Filled 2018-09-21: qty 2

## 2018-09-21 MED ORDER — DICYCLOMINE HCL 20 MG PO TABS: 20.0000 mg | ORAL_TABLET | Freq: Three times a day (TID) | ORAL | 0 refills | Status: DC

## 2018-09-21 MED ORDER — TRAMADOL HCL 50 MG PO TABS: 50.0000 mg | ORAL_TABLET | Freq: Four times a day (QID) | ORAL | 0 refills | Status: DC | PRN

## 2018-09-21 NOTE — Discharge Summary (Signed)
Physician Discharge Summary  [name] OHF:[id number] DOB: Jun 27, 1990 DOA: 09/18/2018  PCP: [name], MD  Admit date: 09/18/2018 Discharge date: 09/21/2018  Admitted From: Home Disposition: Home  Recommendations for Outpatient Follow-up:  1. Follow up with PCP in 1 week with repeat CBC/BMP 2. Outpatient follow-up with GI.  Patient should follow-up with [facility] for gastric pacer placement. 3. Patient will need outpatient evaluation and follow-up by pain management. 4. Follow up in ED if symptoms worsen or new appear   Home Health: No Equipment/Devices: None  Discharge Condition: Guarded CODE STATUS: Full Diet recommendation: Heart healthy/carb modified  Brief/Interim Summary: 28 year old female with history of diabetes mellitus type 1, diabetic gastroparesis, chronic pain, depression, GERD presented on 09/18/2018 with nausea, vomiting and abdominal pain.  In the ED, she was found to have DKA with acute kidney injury and started on IV fluids and insulin drip.  After anion gap closed, she was transitioned to long-acting insulin.  She is complaining of severe abdominal pain during the hospitalization and persistently requested for Dilaudid/fentanyl.  She was treated with intravenous Toradol and oral tramadol as needed.  Her condition is improved.  She will be discharged home.  She will need outpatient follow-up with [facility] for gastric pacemaker placement.  She will also need outpatient evaluation and follow-up by pain management.  Discharge Diagnoses:  Principal Problem:   DKA, type 1 ([place]) Active Problems:   Chronic abdominal pain   Hypertension associated with diabetes ([place])   MDD (major depressive disorder), recurrent episode, mild ([facility])   Intractable nausea and vomiting   Acute kidney injury ([place])   Diabetic gastroparesis associated with type 1 diabetes mellitus ([place])   Intractable vomiting   Intractable vomiting with nausea  DKA in a patient  with type 1 diabetes mellitus -A1c 10 in March 2020  -started on insulin drip on presentation.  After anion gap closed, subsequently switched to long-acting insulin.   -Diabetes coordinator following.  Hemoglobin A1c 9.6 during this admission. -Carb modified diet.  Continue current home regimen of insulin on discharge.  Outpatient follow-up with PCP. -Currently tolerating diet.  Intractable nausea, vomiting with abdominal pain in a patient with chronic abdominal pain and diabetic gastroparesis -Patient has poorly controlled type 1 diabetes with gastroparesis.  She is supposed to have gastric pacer placed at [facility] at some point. -Patient has had issues with chronic pain with intermittent admissions to the hospital for the same and states that she is nontolerant to tramadol or Toradol and requests for Dilaudid/fentanyl.  Fentanyl was discontinued on 09/20/2018.  GI in the past has recommended to avoid opiates including Dilaudid. -Toradol and tramadol were used during the hospitalization. -Continue Reglan, Zofran and Phenergan suppository if needed. -GI evaluation and follow-up appreciated. -During the hospitalization, patient was intermittently crying on evaluation by medical providers including nursing [name] but would then fall back to sleep once providers would leave the room.  She has pain medication seeking behavior. -Spoke to GI/Dr. Silverio Decamp on phone and at this point, will request palliative care consultation for help in managing her chronic pain.  Would still like to avoid Dilaudid/narcotics as much as possible. -Patient feels much better this morning and wants to go home.  Will discharge home on oral tramadol for few days.  She will need outpatient pain management evaluation.  Acute kidney injury -From dehydration and DKA.   Creatinine is improved today.  DC IV fluids.  Hypokalemia -replace.  Depression/anxiety -Continue Remeron and  Ativan.  Outpatient  follow-up  Discharge Instructions  Discharge Instructions    Ambulatory referral to Gastroenterology   Complete by: As directed    Diet - low sodium heart healthy   Complete by: As directed    Diet Carb Modified   Complete by: As directed    Increase activity slowly   Complete by: As directed      Allergies as of 09/21/2018      Reactions   Other Anaphylaxis   Reaction to [place] nuts   Lactose Intolerance (gi) Diarrhea      Medication List    STOP taking these medications   benzonatate 100 MG capsule Commonly known as: Tessalon Perles   furosemide 40 MG tablet Commonly known as: Lasix   lidocaine 5 % Commonly known as: LIDODERM     TAKE these medications   diclofenac 75 MG EC tablet Commonly known as: VOLTAREN Take 1 tablet (75 mg total) by mouth daily.   dicyclomine 20 MG tablet Commonly known as: BENTYL Take 1 tablet (20 mg total) by mouth 4 (four) times daily -  before meals and at bedtime. What changed:   when to take this  reasons to take this   FreeStyle Libre 14 Day Sensor Misc 1 Device by Does not apply route every 14 (fourteen) days.   insulin lispro 100 UNIT/ML injection Commonly known as: HUMALOG Inject 0-0.1 mLs (0-10 Units total) into the skin See admin instructions. Inject 0-10 units subcutaneously three times daily with meals per sliding scale   Lantus 100 UNIT/ML injection Generic drug: insulin glargine Inject 0.08 mLs (8 Units total) into the skin at bedtime. Patient using Vials   LORazepam 0.5 MG tablet Commonly known as: ATIVAN TAKE 1 TABLET BY MOUTH  TWICE A DAY AS NEEDED FOR  ANXIETY What changed:   how much to take  how to take this  when to take this  reasons to take this  additional instructions   metoCLOPramide 10 MG tablet Commonly known as: REGLAN Take 1 tablet (10 mg total) by mouth every 6 (six) hours as needed for nausea or vomiting.   mirtazapine 30 MG tablet Commonly known as: Remeron Take 1 tablet (30 mg  total) by mouth at bedtime.   ondansetron 4 MG tablet Commonly known as: Zofran Take 1 tablet (4 mg total) by mouth every 8 (eight) hours as needed for nausea or vomiting (If necessary, dont combine with Reglan.).   pantoprazole 40 MG tablet Commonly known as: PROTONIX Take 1 tablet (40 mg total) by mouth 2 (two) times daily.   promethazine 12.5 MG tablet Commonly known as: PHENERGAN TAKE 1 TABLET BY MOUTH  EVERY 8 HOURS AS NEEDED FOR NAUSEA AND VOMITING What changed: See the new instructions.   promethazine 12.5 MG suppository Commonly known as: Phenergan Place 1 suppository (12.5 mg total) rectally every 8 (eight) hours as needed for nausea or vomiting. Note: Do not take while taking oral phenergan What changed: Another medication with the same name was changed. Make sure you understand how and when to take each.   traMADol 50 MG tablet Commonly known as: ULTRAM Take 1 tablet (50 mg total) by mouth every 6 (six) hours as needed for moderate pain or severe pain.      Follow-up Information    [name], MD. Schedule an appointment as soon as possible for a visit in 1 week(s).   Specialty: Family Medicine Why: With repeat CBC/BMP Contact information: [address] [place] [address]  [phone]        Yetta Flock, MD. Schedule an appointment as soon as possible for a visit in 1 week(s).   Specialty: Gastroenterology Contact information: [place] [address] [place] [address] [phone]        pain management Follow up.   Why: At earliest convenience         Allergies  Allergen Reactions  . Other Anaphylaxis    Reaction to [place] nuts   . Lactose Intolerance (Gi) Diarrhea    Consultations:  GI   Procedures/Studies: Dg Chest 2 View  Result Date: 09/01/2018 CLINICAL DATA:  History of pneumonia.  Productive cough. EXAM: CHEST - 2 VIEW COMPARISON:  08/20/2018 FINDINGS: The cardiac silhouette, mediastinal and hilar contours are  within normal limits and stable. There is some persistent streaky airspace opacity in the right middle lobe suggesting persistent pneumonia. The left lung is clear. No pleural effusions. The bony thorax is intact. IMPRESSION: Persistent right middle lobe infiltrate. Electronically Signed   By: Marijo Sanes M.D.   On: 09/01/2018 17:33   Dg Chest Portable 1 View  Result Date: 09/18/2018 CLINICAL DATA:  Chest pain, vomiting EXAM: PORTABLE CHEST 1 VIEW COMPARISON:  09/17/2018, 09/01/2018 FINDINGS: The heart size and mediastinal contours are within normal limits. Both lungs are clear. The visualized skeletal structures are unremarkable. IMPRESSION: No active disease. Electronically Signed   By: Donavan Foil M.D.   On: 09/18/2018 17:51   Dg Chest Portable 1 View  Result Date: 09/17/2018 CLINICAL DATA:  DKA. Nausea and vomiting. EXAM: PORTABLE CHEST 1 VIEW COMPARISON:  09/01/2018 FINDINGS: The cardiomediastinal contours are normal. Unchanged patchy right infrahilar opacity, which on prior frontal and lateral views localized to the right middle lobe. No new airspace disease. Pulmonary vasculature is normal. No pleural effusion or pneumothorax. No acute osseous abnormalities are seen. IMPRESSION: Patchy right infrahilar opacity is unchanged from radiographs earlier this month. No new abnormality. Electronically Signed   By: Keith Rake M.D.   On: 09/17/2018 20:40       Subjective: Patient seen and examined at bedside.  She feels better and is tolerating diet.  She wants to go home.  He was early this morning, she had complained of 10 out of 10 pain.  No overnight fever or vomiting.  Discharge Exam: Vitals:   09/21/18 0904 09/21/18 0912  BP: (!) 107/58 (!) 152/108  Pulse: 75 (!) 106  Resp: 18 16  Temp: 97.7 F (36.5 C) 98.6 F (37 C)  SpO2: 96% 100%    General: Pt is alert, awake, not in acute distress.  Poor historian Cardiovascular: Slightly tachycardic, S1/S2 + Respiratory: bilateral  decreased breath sounds at bases Abdominal: Soft, NT, ND, bowel sounds + Extremities: no edema, no cyanosis    The results of significant diagnostics from this hospitalization (including imaging, microbiology, ancillary and laboratory) are listed below for reference.     Microbiology: Recent Results (from the past 240 hour(s))  Novel Coronavirus,NAA,(SEND-OUT TO REF LAB - TAT 24-48 hrs); Hosp Order     Status: None   Collection Time: 09/17/18  9:50 PM   Specimen: Nasopharyngeal Swab; Respiratory  Result Value Ref Range Status   SARS-CoV-2, NAA NOT DETECTED NOT DETECTED Final    Comment: (NOTE) This test was developed and its performance characteristics determined by [employer]. This test has not been FDA cleared or approved. This test has been authorized by FDA under an Emergency Use Authorization (EUA). This test is only authorized  for the duration of time the declaration that circumstances exist justifying the authorization of the emergency use of in vitro diagnostic tests for detection of SARS-CoV-2 virus and/or diagnosis of COVID-19 infection under section 564(b)(1) of the Act, 21 U.S.C. 016WFU-9(N)(2), unless the authorization is terminated or revoked sooner. When diagnostic testing is negative, the possibility of a false negative result should be considered in the context of a patient's recent exposures and the presence of clinical signs and symptoms consistent with COVID-19. An individual without symptoms of COVID-19 and who is not shedding SARS-CoV-2 virus would expect to have a negative (not detected) result in this assay. Performed  At: [facility] [address] [place], [place] [id number] Rush Farmer MD RK:[id number]    [place]  Final    Comment: Performed at [facility], [place] [address], [place], [place] [address]  SARS Coronavirus 2 (CEPHEID - Performed in [facility] hospital lab), Hosp Order     Status: None    Collection Time: 09/18/18  7:08 PM   Specimen: Nasopharyngeal Swab  Result Value Ref Range Status   SARS Coronavirus 2 NEGATIVE NEGATIVE Final    Comment: (NOTE) If result is NEGATIVE SARS-CoV-2 target nucleic acids are NOT DETECTED. The SARS-CoV-2 RNA is generally detectable in upper and lower  respiratory specimens during the acute phase of infection. The lowest  concentration of SARS-CoV-2 viral copies this assay can detect is 250  copies / mL. A negative result does not preclude SARS-CoV-2 infection  and should not be used as the sole basis for treatment or other  patient management decisions.  A negative result may occur with  improper specimen collection / handling, submission of specimen other  than nasopharyngeal swab, presence of viral mutation(s) within the  areas targeted by this assay, and inadequate number of viral copies  (<250 copies / mL). A negative result must be combined with clinical  observations, patient history, and epidemiological information. If result is POSITIVE SARS-CoV-2 target nucleic acids are DETECTED. The SARS-CoV-2 RNA is generally detectable in upper and lower  respiratory specimens dur ing the acute phase of infection.  Positive  results are indicative of active infection with SARS-CoV-2.  Clinical  correlation with patient history and other diagnostic information is  necessary to determine patient infection status.  Positive results do  not rule out bacterial infection or co-infection with other viruses. If result is PRESUMPTIVE POSTIVE SARS-CoV-2 nucleic acids MAY BE PRESENT.   A presumptive positive result was obtained on the submitted specimen  and confirmed on repeat testing.  While 2019 novel coronavirus  (SARS-CoV-2) nucleic acids may be present in the submitted sample  additional confirmatory testing may be necessary for epidemiological  and / or clinical management purposes  to differentiate between  SARS-CoV-2 and other Sarbecovirus  currently known to infect humans.  If clinically indicated additional testing with an alternate test  methodology [phone]) is advised. The SARS-CoV-2 RNA is generally  detectable in upper and lower respiratory sp ecimens during the acute  phase of infection. The expected result is Negative. Fact Sheet for Patients:  StrictlyIdeas.no Fact Sheet for Healthcare Providers: BankingDealers.co.za This test is not yet approved or cleared by the [place] FDA and has been authorized for detection and/or diagnosis of SARS-CoV-2 by FDA under an Emergency Use Authorization (EUA).  This EUA will remain in effect (meaning this test can be used) for the duration of the COVID-19 declaration under Section 564(b)(1) of the Act, 21 U.S.C. section 360bbb-3(b)(1),  unless the authorization is terminated or revoked sooner. Performed at [facility], [place] [address], [place], [place] [address]   Urine Culture     Status: Abnormal   Collection Time: 09/18/18  7:11 PM   Specimen: Urine, Random  Result Value Ref Range Status   Specimen Description URINE, RANDOM  Final   Special Requests   Final    NONE Performed at [facility], [place] [address], [place], [place] [address]    Culture 60,000 COLONIES/mL LACTOBACILLUS SPECIES (A)  Final   Report Status 09/20/2018 FINAL  Final  MRSA PCR Screening     Status: None   Collection Time: 09/19/18  9:26 AM   Specimen: Nasal Mucosa; Nasopharyngeal  Result Value Ref Range Status   MRSA by PCR NEGATIVE NEGATIVE Final    Comment:        The GeneXpert MRSA Assay (FDA approved for NASAL specimens only), is one component of a comprehensive MRSA colonization surveillance program. It is not intended to diagnose MRSA infection nor to guide or monitor treatment for MRSA infections. Performed at [facility], [place] [address] [place], [place] [address]      Labs: BNP (last 3 results) No results for  input(s): BNP in the last 8760 hours. Basic Metabolic Panel: Recent Labs  Lab 09/19/18 0222 09/19/18 0641 09/19/18 1132 09/20/18 0942 09/21/18 0425  NA 142 137 137 136 133*  K 3.5 3.5 3.5 3.0* 3.2*  CL 108 106 104 101 101  CO2 22 22 21* 22 25  GLUCOSE 82 201* 133* 340* 92  BUN 24* 24* 20 26* 20  CREATININE 0.89 0.83 0.70 1.48* 0.85  CALCIUM 8.3* 8.4* 8.5* 8.4* 8.2*  MG  --   --   --  1.5*  --    Liver Function Tests: Recent Labs  Lab 09/19/18 1558  AST 22  ALT 43  ALKPHOS 89  BILITOT 0.8  PROT 6.7  ALBUMIN 2.8*   Recent Labs  Lab 09/19/18 1558  LIPASE 39   No results for input(s): AMMONIA in the last 168 hours. CBC: Recent Labs  Lab 09/17/18 2014 09/17/18 2033 09/18/18 1748 09/19/18 0641 09/20/18 0942  WBC 4.9  --  6.4 6.5 4.9  NEUTROABS 2.9  --  5.1 4.6 3.7  HGB 11.3* 11.2* 11.1* 9.4* 10.2*  HCT 34.4* 33.0* 34.1* 28.8* 31.6*  MCV 83.7  --  84.4 84.2 84.3  PLT 191  --  226 200 213   Cardiac Enzymes: No results for input(s): CKTOTAL, CKMB, CKMBINDEX, TROPONINI in the last 168 hours. BNP: Invalid input(s): POCBNP CBG: Recent Labs  Lab 09/20/18 1742 09/20/18 1955 09/21/18 0016 09/21/18 0419 09/21/18 0846  GLUCAP 134* 98 157* 82 126*   D-Dimer No results for input(s): DDIMER in the last 72 hours. Hgb A1c Recent Labs    09/20/18 0942  HGBA1C 9.6*   Lipid Profile No results for input(s): CHOL, HDL, LDLCALC, TRIG, CHOLHDL, LDLDIRECT in the last 72 hours. Thyroid function studies No results for input(s): TSH, T4TOTAL, T3FREE, THYROIDAB in the last 72 hours.  Invalid input(s): FREET3 Anemia work up No results for input(s): VITAMINB12, FOLATE, FERRITIN, TIBC, IRON, RETICCTPCT in the last 72 hours. Urinalysis    Component Value Date/Time   COLORURINE YELLOW 09/18/2018 1842   APPEARANCEUR HAZY (A) 09/18/2018 1842   LABSPEC 1.015 09/18/2018 1842   PHURINE 6.0 09/18/2018 1842   GLUCOSEU >=500 (A) 09/18/2018 1842   HGBUR MODERATE (A)  09/18/2018 1842  BILIRUBINUR NEGATIVE 09/18/2018 1842   BILIRUBINUR negative 01/02/2015 1717   KETONESUR 20 (A) 09/18/2018 1842   PROTEINUR >=300 (A) 09/18/2018 1842   UROBILINOGEN 0.2 01/13/2015 0223   NITRITE NEGATIVE 09/18/2018 1842   LEUKOCYTESUR NEGATIVE 09/18/2018 1842   Sepsis Labs Invalid input(s): PROCALCITONIN,  WBC,  LACTICIDVEN Microbiology Recent Results (from the past 240 hour(s))  Novel Coronavirus,NAA,(SEND-OUT TO REF LAB - TAT 24-48 hrs); Hosp Order     Status: None   Collection Time: 09/17/18  9:50 PM   Specimen: Nasopharyngeal Swab; Respiratory  Result Value Ref Range Status   SARS-CoV-2, NAA NOT DETECTED NOT DETECTED Final    Comment: (NOTE) This test was developed and its performance characteristics determined by [employer]. This test has not been FDA cleared or approved. This test has been authorized by FDA under an Emergency Use Authorization (EUA). This test is only authorized for the duration of time the declaration that circumstances exist justifying the authorization of the emergency use of in vitro diagnostic tests for detection of SARS-CoV-2 virus and/or diagnosis of COVID-19 infection under section 564(b)(1) of the Act, 21 U.S.C. 361WER-1(V)(4), unless the authorization is terminated or revoked sooner. When diagnostic testing is negative, the possibility of a false negative result should be considered in the context of a patient's recent exposures and the presence of clinical signs and symptoms consistent with COVID-19. An individual without symptoms of COVID-19 and who is not shedding SARS-CoV-2 virus would expect to have a negative (not detected) result in this assay. Performed  At: [facility] [address] [place], [place] [id number] Rush Farmer MD KD:[id number]    [place]  Final    Comment: Performed at [facility], [place] [address] [place], [place] [address]  SARS Coronavirus 2  (CEPHEID - Performed in [facility] hospital lab), Hosp Order     Status: None   Collection Time: 09/18/18  7:08 PM   Specimen: Nasopharyngeal Swab  Result Value Ref Range Status   SARS Coronavirus 2 NEGATIVE NEGATIVE Final    Comment: (NOTE) If result is NEGATIVE SARS-CoV-2 target nucleic acids are NOT DETECTED. The SARS-CoV-2 RNA is generally detectable in upper and lower  respiratory specimens during the acute phase of infection. The lowest  concentration of SARS-CoV-2 viral copies this assay can detect is 250  copies / mL. A negative result does not preclude SARS-CoV-2 infection  and should not be used as the sole basis for treatment or other  patient management decisions.  A negative result may occur with  improper specimen collection / handling, submission of specimen other  than nasopharyngeal swab, presence of viral mutation(s) within the  areas targeted by this assay, and inadequate number of viral copies  (<250 copies / mL). A negative result must be combined with clinical  observations, patient history, and epidemiological information. If result is POSITIVE SARS-CoV-2 target nucleic acids are DETECTED. The SARS-CoV-2 RNA is generally detectable in upper and lower  respiratory specimens dur ing the acute phase of infection.  Positive  results are indicative of active infection with SARS-CoV-2.  Clinical  correlation with patient history and other diagnostic information is  necessary to determine patient infection status.  Positive results do  not rule out bacterial infection or co-infection with other viruses. If result is PRESUMPTIVE POSTIVE SARS-CoV-2 nucleic acids MAY BE PRESENT.   A presumptive positive result was obtained on the submitted specimen  and confirmed on repeat testing.  While 2019 novel coronavirus  (SARS-CoV-2) nucleic acids  may be present in the submitted sample  additional confirmatory testing may be necessary for epidemiological  and / or clinical  management purposes  to differentiate between  SARS-CoV-2 and other Sarbecovirus currently known to infect humans.  If clinically indicated additional testing with an alternate test  methodology [phone]) is advised. The SARS-CoV-2 RNA is generally  detectable in upper and lower respiratory sp ecimens during the acute  phase of infection. The expected result is Negative. Fact Sheet for Patients:  StrictlyIdeas.no Fact Sheet for Healthcare Providers: BankingDealers.co.za This test is not yet approved or cleared by the [place] FDA and has been authorized for detection and/or diagnosis of SARS-CoV-2 by FDA under an Emergency Use Authorization (EUA).  This EUA will remain in effect (meaning this test can be used) for the duration of the COVID-19 declaration under Section 564(b)(1) of the Act, 21 U.S.C. section 360bbb-3(b)(1), unless the authorization is terminated or revoked sooner. Performed at [facility], [place] [address] [place], [place] [address]   Urine Culture     Status: Abnormal   Collection Time: 09/18/18  7:11 PM   Specimen: Urine, Random  Result Value Ref Range Status   Specimen Description URINE, RANDOM  Final   Special Requests   Final    NONE Performed at [facility], [place] [address] [place], [place] [address]    Culture 60,000 COLONIES/mL LACTOBACILLUS SPECIES (A)  Final   Report Status 09/20/2018 FINAL  Final  MRSA PCR Screening     Status: None   Collection Time: 09/19/18  9:26 AM   Specimen: Nasal Mucosa; Nasopharyngeal  Result Value Ref Range Status   MRSA by PCR NEGATIVE NEGATIVE Final    Comment:        The GeneXpert MRSA Assay (FDA approved for NASAL specimens only), is one component of a comprehensive MRSA colonization surveillance program. It is not intended to diagnose MRSA infection nor to guide or monitor treatment for MRSA infections. Performed at [facility], [place]  [address] [place], [place] [address]      Time coordinating discharge: 35 minutes  SIGNED:   Aline August, MD  [facility] 09/21/2018, 11:28 AM

## 2018-09-21 NOTE — Plan of Care (Signed)
  Problem: Education: Goal: Ability to describe self-care measures that may prevent or decrease complications (Diabetes Survival Skills Education) will improve Outcome: Adequate for Discharge   Problem: Cardiac: Goal: Ability to maintain an adequate cardiac output will improve Outcome: Adequate for Discharge   Problem: Health Behavior/Discharge Planning: Goal: Ability to identify and utilize available resources and services will improve Outcome: Adequate for Discharge Goal: Ability to manage health-related needs will improve Outcome: Adequate for Discharge   Problem: Fluid Volume: Goal: Ability to achieve a balanced intake and output will improve Outcome: Adequate for Discharge   Problem: Metabolic: Goal: Ability to maintain appropriate glucose levels will improve Outcome: Adequate for Discharge   Problem: Nutritional: Goal: Maintenance of adequate nutrition will improve Outcome: Adequate for Discharge Goal: Maintenance of adequate weight for body size and type will improve Outcome: Adequate for Discharge

## 2018-09-21 NOTE — Progress Notes (Addendum)
Pt requested a doctor to be page. Pt stated pain is 10/10 and pain meds are not working. Page was sent, no response at this time.

## 2018-09-21 NOTE — Progress Notes (Signed)
Patient is discharged home on self, discharge  Instruction given to patient, medication education done, patient verbalizes understanding. Patient awaiting her ride at this time.

## 2018-09-21 NOTE — Plan of Care (Signed)
  Problem: Education: Goal: Ability to describe self-care measures that may prevent or decrease complications (Diabetes Survival Skills Education) will improve 09/21/2018 1220 by Linton Flemings, RN Outcome: Completed/Met 09/21/2018 1219 by Linton Flemings, RN Outcome: Adequate for Discharge Goal: Individualized Educational Video(s) Outcome: Completed/Met   Problem: Cardiac: Goal: Ability to maintain an adequate cardiac output will improve 09/21/2018 1220 by Linton Flemings, RN Outcome: Completed/Met 09/21/2018 1219 by Linton Flemings, RN Outcome: Adequate for Discharge   Problem: Health Behavior/Discharge Planning: Goal: Ability to identify and utilize available resources and services will improve 09/21/2018 1220 by Linton Flemings, RN Outcome: Completed/Met 09/21/2018 1219 by Linton Flemings, RN Outcome: Adequate for Discharge Goal: Ability to manage health-related needs will improve 09/21/2018 1220 by Linton Flemings, RN Outcome: Completed/Met 09/21/2018 1219 by Linton Flemings, RN Outcome: Adequate for Discharge   Problem: Fluid Volume: Goal: Ability to achieve a balanced intake and output will improve 09/21/2018 1220 by Linton Flemings, RN Outcome: Completed/Met 09/21/2018 1219 by Linton Flemings, RN Outcome: Adequate for Discharge   Problem: Metabolic: Goal: Ability to maintain appropriate glucose levels will improve 09/21/2018 1220 by Linton Flemings, RN Outcome: Completed/Met 09/21/2018 1219 by Linton Flemings, RN Outcome: Adequate for Discharge   Problem: Nutritional: Goal: Maintenance of adequate nutrition will improve 09/21/2018 1220 by Linton Flemings, RN Outcome: Completed/Met 09/21/2018 1219 by Linton Flemings, RN Outcome: Adequate for Discharge Goal: Maintenance of adequate weight for body size and type will improve 09/21/2018 1220 by Linton Flemings, RN Outcome: Completed/Met 09/21/2018 1219 by Linton Flemings, RN Outcome: Adequate for Discharge   Problem:  Urinary Elimination: Goal: Ability to achieve and maintain adequate renal perfusion and functioning will improve Outcome: Completed/Met

## 2018-09-22 ENCOUNTER — Telehealth: Payer: Self-pay

## 2018-09-22 NOTE — Telephone Encounter (Signed)
LM for patient to return call for TCM. 

## 2018-09-23 ENCOUNTER — Telehealth: Payer: Self-pay

## 2018-09-23 ENCOUNTER — Encounter: Payer: Self-pay | Admitting: Family Medicine

## 2018-09-23 NOTE — Telephone Encounter (Signed)
LM for patient to return call for TCM. 

## 2018-09-29 ENCOUNTER — Telehealth: Payer: Self-pay | Admitting: Endocrinology

## 2018-09-29 NOTE — Telephone Encounter (Signed)
[facility] called to confirm that we received a fax request for office visit notes for [name] MRN [id number]. Please advise [phone] est [phone]

## 2018-09-29 NOTE — Telephone Encounter (Signed)
Blanket fax, back fax, inboxes and already faxed items reviewed for documents. Nothing received.

## 2018-10-05 ENCOUNTER — Telehealth: Payer: Self-pay | Admitting: Family Medicine

## 2018-10-05 NOTE — Telephone Encounter (Signed)
See note

## 2018-10-05 NOTE — Telephone Encounter (Signed)
Pt needs to show up to appointments and be compliant with medications. Do not feel the need to call her insurance company to discuss further.  Algis Greenhouse. Jerline Pain, MD 10/05/2018 4:43 PM

## 2018-10-05 NOTE — Telephone Encounter (Signed)
Dr Jackquline Berlin is reaching out to dr Jerline Pain concerning this patient who has had multi-able hospital admission. Dr Jackquline Berlin would like to discuss with dr Jerline Pain how they may assist in helping this patient and would like dr Jerline Pain to call her back

## 2018-10-05 NOTE — Telephone Encounter (Signed)
Dr Elsworth Soho calling back . Please call.   SP#[phone]

## 2018-10-06 ENCOUNTER — Telehealth: Payer: Self-pay | Admitting: Physical Therapy

## 2018-10-06 NOTE — Telephone Encounter (Signed)
See note, please contact as soon as possible

## 2018-10-06 NOTE — Telephone Encounter (Signed)
Copied from [facility] [id number]. Topic: General - Other >> Oct 06, 2018  9:53 AM Ivar Drape wrote: Reason for CRM:   Dr. Venita Sheffield w/[facility], [phone], a branch of [facility] would like a call back to discuss patient.  She will be there until 6:00pm

## 2018-10-06 NOTE — Telephone Encounter (Signed)
Dr Elsworth Soho called back to speak to Dr Dimas Chyle asking for a call back at Ph# [phone] states that it very important to speak with Dr Jerline Pain. Please advise

## 2018-10-07 NOTE — Telephone Encounter (Signed)
Spoke with Dr. Elsworth Soho.  Relayed message.  She verbalized understanding.

## 2018-10-07 NOTE — Telephone Encounter (Signed)
Pt no longer in the hospital - not sure why this was sent to [place].  Please pass message along to Dr Elsworth Soho (who works with [facility] and is not a physician that is part of patients care team) that pt needs to be compliant with meds and office visits to reduce hospitalizations. I do not feel the need to personally talk to patient's insurance company about this.

## 2018-10-07 NOTE — Telephone Encounter (Signed)
Forwarding to Dr. Jerline Pain.

## 2018-10-13 ENCOUNTER — Emergency Department (HOSPITAL_COMMUNITY)
Admission: EM | Admit: 2018-10-13 | Discharge: 2018-10-13 | Disposition: A | Discharge status: Home / Self Care | Payer: 59 | Attending: Emergency Medicine | Admitting: Emergency Medicine

## 2018-10-13 ENCOUNTER — Other Ambulatory Visit: Payer: Self-pay

## 2018-10-13 ENCOUNTER — Encounter (HOSPITAL_COMMUNITY): Payer: Self-pay

## 2018-10-13 DIAGNOSIS — R1013 Epigastric pain: Secondary | ICD-10-CM | POA: Diagnosis present

## 2018-10-13 DIAGNOSIS — I10 Essential (primary) hypertension: Secondary | ICD-10-CM | POA: Insufficient documentation

## 2018-10-13 DIAGNOSIS — E1065 Type 1 diabetes mellitus with hyperglycemia: Secondary | ICD-10-CM | POA: Insufficient documentation

## 2018-10-13 DIAGNOSIS — Z79899 Other long term (current) drug therapy: Secondary | ICD-10-CM | POA: Diagnosis not present

## 2018-10-13 DIAGNOSIS — Z20828 Contact with and (suspected) exposure to other viral communicable diseases: Secondary | ICD-10-CM | POA: Insufficient documentation

## 2018-10-13 DIAGNOSIS — G8929 Other chronic pain: Secondary | ICD-10-CM | POA: Diagnosis not present

## 2018-10-13 DIAGNOSIS — R739 Hyperglycemia, unspecified: Secondary | ICD-10-CM

## 2018-10-13 LAB — CBC WITH DIFFERENTIAL/PLATELET
Abs Immature Granulocytes: 0.01 10*3/uL (ref 0.00–0.07)
Basophils Absolute: 0 10*3/uL (ref 0.0–0.1)
Basophils Relative: 1 %
Eosinophils Absolute: 0.1 10*3/uL (ref 0.0–0.5)
Eosinophils Relative: 1 %
HCT: 36.6 % (ref 36.0–46.0)
Hemoglobin: 11.8 g/dL — ABNORMAL LOW (ref 12.0–15.0)
Immature Granulocytes: 0 %
Lymphocytes Relative: 24 %
Lymphs Abs: 1.2 10*3/uL (ref 0.7–4.0)
MCH: 27.6 pg (ref 26.0–34.0)
MCHC: 32.2 g/dL (ref 30.0–36.0)
MCV: 85.7 fL (ref 80.0–100.0)
Monocytes Absolute: 0.3 10*3/uL (ref 0.1–1.0)
Monocytes Relative: 6 %
Neutro Abs: 3.2 10*3/uL (ref 1.7–7.7)
Neutrophils Relative %: 68 %
Platelets: 201 10*3/uL (ref 150–400)
RBC: 4.27 MIL/uL (ref 3.87–5.11)
RDW: 12.9 % (ref 11.5–15.5)
WBC: 4.7 10*3/uL (ref 4.0–10.5)
nRBC: 0 % (ref 0.0–0.2)

## 2018-10-13 LAB — COMPREHENSIVE METABOLIC PANEL
ALT: 149 U/L — ABNORMAL HIGH (ref 0–44)
AST: 159 U/L — ABNORMAL HIGH (ref 15–41)
Albumin: 4.3 g/dL (ref 3.5–5.0)
Alkaline Phosphatase: 116 U/L (ref 38–126)
Anion gap: 15 (ref 5–15)
BUN: 11 mg/dL (ref 6–20)
CO2: 28 mmol/L (ref 22–32)
Calcium: 9.8 mg/dL (ref 8.9–10.3)
Chloride: 97 mmol/L — ABNORMAL LOW (ref 98–111)
Creatinine, Ser: 0.68 mg/dL (ref 0.44–1.00)
GFR calc Af Amer: >60 mL/min (ref >60)
GFR calc non Af Amer: >60 mL/min (ref >60)
Glucose, Bld: 327 mg/dL — ABNORMAL HIGH (ref 70–99)
Potassium: 3.2 mmol/L — ABNORMAL LOW (ref 3.5–5.1)
Sodium: 140 mmol/L (ref 135–145)
Total Bilirubin: 0.5 mg/dL (ref 0.3–1.2)
Total Protein: 9 g/dL — ABNORMAL HIGH (ref 6.5–8.1)

## 2018-10-13 LAB — RAPID URINE DRUG SCREEN, HOSP PERFORMED
Amphetamines: NOT DETECTED
Barbiturates: NOT DETECTED
Benzodiazepines: NOT DETECTED
Cocaine: NOT DETECTED
Opiates: NOT DETECTED
Tetrahydrocannabinol: POSITIVE — AB

## 2018-10-13 LAB — URINALYSIS, ROUTINE W REFLEX MICROSCOPIC
Bacteria, UA: NONE SEEN
Bilirubin Urine: NEGATIVE
Glucose, UA: >=500 mg/dL — AB
Ketones, ur: 5 mg/dL — AB
Leukocytes,Ua: NEGATIVE
Nitrite: NEGATIVE
Protein, ur: 100 mg/dL — AB
Specific Gravity, Urine: 1.007 (ref 1.005–1.030)
pH: 9 — ABNORMAL HIGH (ref 5.0–8.0)

## 2018-10-13 LAB — CBG MONITORING, ED: Glucose-Capillary: 306 mg/dL — ABNORMAL HIGH (ref 70–99)

## 2018-10-13 LAB — I-STAT BETA HCG BLOOD, ED (MC, WL, AP ONLY): I-stat hCG, quantitative: <5 m[IU]/mL (ref <5)

## 2018-10-13 LAB — SARS CORONAVIRUS 2 BY RT PCR (HOSPITAL ORDER, PERFORMED IN ~~LOC~~ HOSPITAL LAB): SARS Coronavirus 2: NEGATIVE

## 2018-10-13 LAB — LIPASE, BLOOD: Lipase: 25 U/L (ref 11–51)

## 2018-10-13 MED ORDER — SODIUM CHLORIDE 0.9 % IV BOLUS
2000.0000 mL | Freq: Once | INTRAVENOUS | Status: AC
  Administered 2018-10-13: 2000 mL via INTRAVENOUS

## 2018-10-13 MED ORDER — METOCLOPRAMIDE HCL 5 MG/ML IJ SOLN
10.0000 mg | Freq: Once | INTRAMUSCULAR | Status: AC
  Administered 2018-10-13: 10 mg via INTRAVENOUS
  Filled 2018-10-13: qty 2

## 2018-10-13 MED ORDER — INSULIN ASPART 100 UNIT/ML ~~LOC~~ SOLN
6.0000 [IU] | Freq: Once | SUBCUTANEOUS | Status: AC
  Administered 2018-10-13: 6 [IU] via SUBCUTANEOUS
  Filled 2018-10-13: qty 0.06

## 2018-10-13 MED ORDER — MORPHINE SULFATE (PF) 4 MG/ML IV SOLN: 4.0000 mg | Freq: Once | INTRAVENOUS | Status: DC

## 2018-10-13 MED ORDER — LORAZEPAM 2 MG/ML IJ SOLN
2.0000 mg | Freq: Once | INTRAMUSCULAR | Status: AC
  Administered 2018-10-13: 2 mg via INTRAVENOUS
  Filled 2018-10-13: qty 1

## 2018-10-13 MED ORDER — LORAZEPAM 2 MG/ML IJ SOLN: 1.0000 mg | Freq: Once | INTRAMUSCULAR | Status: DC

## 2018-10-13 MED ORDER — SODIUM CHLORIDE 0.9 % IV SOLN: INTRAVENOUS | Status: DC

## 2018-10-13 NOTE — ED Provider Notes (Signed)
[facility] DEPT Provider Note   CSN: [id number] Arrival date & time: 10/13/18  1554     History   Chief Complaint Chief Complaint  Patient presents with  . Abdominal Pain    HPI [name] is a 28 y.o. female.     28 year old female with history of type 1 diabetes presents with abdominal pain times several days with emesis consistent with her gastroparesis.  Review of her old chart shows that patient has history of chronic abdominal pain along with drug-seeking behavior.  Patient denies any fever or chills.  No vaginal bleeding or discharge.  Does endorse some polyuria.  Pain is localized to her mid epigastric area and does radiate to her back.  Has been unable to keep her usual medications down at home.     Past Medical History:  Diagnosis Date  . Allergy   . Anemia   . Anxiety   . Blood transfusion without reported diagnosis    Dec 2018  . Cataract    right eye  . Depression   . Diabetes type 1, uncontrolled ([place]) 11/14/2011   Since age 61  . Fibromyalgia   . Gastroparesis   . GERD (gastroesophageal reflux disease)   . Hypertension   . Infection    UTI April 2016    Patient Active Problem List   Diagnosis Date Noted  . DKA, type 1 ([place]) 09/18/2018  . Pneumonia 08/20/2018  . Contraception management 08/06/2018  . Intractable vomiting with nausea 07/30/2018  . Intractable vomiting   . Cyclical vomiting syndrome 06/07/2018  . Diabetic gastroparesis associated with type 1 diabetes mellitus ([place]) 06/04/2018  . Anxiety 05/31/2018  . Peripheral edema 05/31/2018  . Acute kidney injury ([place]) 03/22/2018  . Normocytic anemia 07/10/2017  . GERD (gastroesophageal reflux disease) 06/23/2017  . Intractable nausea and vomiting 04/11/2017  . MDD (major depressive disorder), recurrent episode, mild ([place]) 02/28/2017  . Diabetes mellitus type 1 ([place]) 04/28/2013  . Protein-calorie malnutrition, severe ([place]) 04/28/2013  . Hypertension  associated with diabetes ([place]) 11/14/2011  . Chronic abdominal pain 09/18/2011    Past Surgical History:  Procedure Laterality Date  . ANKLE SURGERY    . CHOLECYSTECTOMY  11/15/2011   Procedure: LAPAROSCOPIC CHOLECYSTECTOMY WITH INTRAOPERATIVE CHOLANGIOGRAM;  Surgeon: [name], MD;  Location: [facility];  Service: General;  Laterality: N/A;  . COLONOSCOPY    . COLONOSCOPY WITH PROPOFOL N/A 06/27/2017   Procedure: COLONOSCOPY WITH PROPOFOL;  Surgeon: Milus Banister, MD;  Location: [facility];  Service: Endoscopy;  Laterality: N/A;  . ESOPHAGOGASTRODUODENOSCOPY  12/03/2011   Procedure: ESOPHAGOGASTRODUODENOSCOPY (EGD);  Surgeon: Beryle Beams, MD;  Location: [facility];  Service: Endoscopy;  Laterality: N/A;  . FLEXIBLE SIGMOIDOSCOPY N/A 03/10/2017   Procedure: FLEXIBLE SIGMOIDOSCOPY;  Surgeon: Carol Ada, MD;  Location: [facility];  Service: Endoscopy;  Laterality: N/A;  . INCISION AND DRAINAGE PERIRECTAL ABSCESS N/A 03/01/2017   Procedure: IRRIGATION AND DEBRIDEMENT PERIRECTAL ABSCESS;  Surgeon: Alphonsa Overall, MD;  Location: [facility];  Service: General;  Laterality: N/A;  . IRRIGATION AND DEBRIDEMENT BUTTOCKS N/A 03/23/2017   Procedure: IRRIGATION AND DEBRIDEMENT BUTTOCKS, SETON PLACEMENT;  Surgeon: [name], MD;  Location: [facility];  Service: General;  Laterality: N/A;  . LAPAROSCOPY  11/23/2011   Procedure: LAPAROSCOPY DIAGNOSTIC;  Surgeon: Edward Jolly, MD;  Location: [facility];  Service: General;  Laterality: N/A;  . SIGMOIDOSCOPY    . UPPER GASTROINTESTINAL ENDOSCOPY    . WISDOM TOOTH EXTRACTION  OB History    Gravida  1   Para  1   Term      Preterm  1   AB      Living  1     SAB      TAB      Ectopic      Multiple  0   Live Births  1            Home Medications    Prior to Admission medications   Medication Sig Start Date End Date Taking? Authorizing Provider  Continuous Blood Gluc Sensor (FREESTYLE LIBRE 14 DAY SENSOR) MISC 1  Device by Does not apply route every 14 (fourteen) days. 06/22/18   [name], MD  diclofenac (VOLTAREN) 75 MG EC tablet Take 1 tablet (75 mg total) by mouth daily. 09/21/18   Aline August, MD  dicyclomine (BENTYL) 20 MG tablet Take 1 tablet (20 mg total) by mouth 4 (four) times daily -  before meals and at bedtime. 09/21/18   Aline August, MD  insulin lispro (HUMALOG) 100 UNIT/ML injection Inject 0-0.1 mLs (0-10 Units total) into the skin See admin instructions. Inject 0-10 units subcutaneously three times daily with meals per sliding scale 09/21/18   Aline August, MD  LANTUS 100 UNIT/ML injection Inject 0.08 mLs (8 Units total) into the skin at bedtime. Patient using Vials 09/21/18   Aline August, MD  LORazepam (ATIVAN) 0.5 MG tablet TAKE 1 TABLET BY MOUTH  TWICE A DAY AS NEEDED FOR  ANXIETY Patient taking differently: Take 0.5 mg by mouth 2 (two) times daily as needed for anxiety.  08/19/18   [name], MD  metoCLOPramide (REGLAN) 10 MG tablet Take 1 tablet (10 mg total) by mouth every 6 (six) hours as needed for nausea or vomiting. 02/20/18   Shelly Coss, MD  mirtazapine (REMERON) 30 MG tablet Take 1 tablet (30 mg total) by mouth at bedtime. 05/31/18   [name], MD  ondansetron (ZOFRAN) 4 MG tablet Take 1 tablet (4 mg total) by mouth every 8 (eight) hours as needed for nausea or vomiting (If necessary, dont combine with Reglan.). 09/21/18   Aline August, MD  pantoprazole (PROTONIX) 40 MG tablet Take 1 tablet (40 mg total) by mouth 2 (two) times daily. 06/10/18   Raiford Noble Latif, DO  promethazine (PHENERGAN) 12.5 MG suppository Place 1 suppository (12.5 mg total) rectally every 8 (eight) hours as needed for nausea or vomiting. Note: Do not take while taking oral phenergan 09/18/18   Deno Etienne, DO  promethazine (PHENERGAN) 12.5 MG tablet TAKE 1 TABLET BY MOUTH  EVERY 8 HOURS AS NEEDED FOR NAUSEA AND VOMITING Patient taking differently: Take 12.5 mg by mouth every 8 (eight)  hours as needed for nausea or vomiting.  08/11/18   Armbruster, Carlota Raspberry, MD  traMADol (ULTRAM) 50 MG tablet Take 1 tablet (50 mg total) by mouth every 6 (six) hours as needed for moderate pain or severe pain. 09/21/18   Aline August, MD    Family History Family History  Problem Relation Age of Onset  . Diabetes Mother   . Hypertension Father   . Colon cancer Paternal Grandmother        pt thinks PGM was dx in her 65's  . Diabetes Paternal Grandmother   . Diabetes Maternal Grandmother   . Diabetes Maternal Grandfather   . Diabetes Paternal Grandfather   . Diabetes Other   . Esophageal cancer Neg Hx   . Liver  cancer Neg Hx   . Pancreatic cancer Neg Hx   . Stomach cancer Neg Hx   . Rectal cancer Neg Hx     Social History Social History   Tobacco Use  . Smoking status: Never Smoker  . Smokeless tobacco: Never Used  Substance Use Topics  . Alcohol use: No    Alcohol/week: 0.0 standard drinks    Frequency: Never  . Drug use: No     Allergies   Other and Lactose intolerance (gi)   Review of Systems Review of Systems  All other systems reviewed and are negative.    Physical Exam Updated Vital Signs BP (!) 172/108 (BP Location: Right Arm)   Pulse (!) 108   Temp 97.7 F (36.5 C) (Oral)   Resp 20   Wt 59 kg   SpO2 100%   BMI 20.37 kg/m   Physical Exam Vitals signs and nursing note reviewed.  Constitutional:      General: She is not in acute distress.    Appearance: Normal appearance. She is well-developed. She is not toxic-appearing.  HENT:     Head: Normocephalic and atraumatic.  Eyes:     General: Lids are normal.     Conjunctiva/sclera: Conjunctivae normal.     Pupils: Pupils are equal, round, and reactive to light.  Neck:     Musculoskeletal: Normal range of motion and neck supple.     Thyroid: No thyroid mass.     Trachea: No tracheal deviation.  Cardiovascular:     Rate and Rhythm: Normal rate and regular rhythm.     Heart sounds: Normal heart  sounds. No murmur. No gallop.   Pulmonary:     Effort: Pulmonary effort is normal. No respiratory distress.     Breath sounds: Normal breath sounds. No stridor. No decreased breath sounds, wheezing, rhonchi or rales.  Abdominal:     General: Bowel sounds are normal. There is no distension.     Palpations: Abdomen is soft.     Tenderness: There is abdominal tenderness in the epigastric area. There is no guarding or rebound.    Musculoskeletal: Normal range of motion.        General: No tenderness.  Skin:    General: Skin is warm and dry.     Findings: No abrasion or rash.  Neurological:     Mental Status: She is alert and oriented to person, place, and time.     GCS: GCS eye subscore is 4. GCS verbal subscore is 5. GCS motor subscore is 6.     Cranial Nerves: No cranial nerve deficit.     Sensory: No sensory deficit.  Psychiatric:        Mood and Affect: Mood is anxious.        Speech: Speech normal.        Behavior: Behavior normal.      ED Treatments / Results  Labs (all labs ordered are listed, but only abnormal results are displayed) Labs Reviewed  CBG MONITORING, ED - Abnormal; Notable for the following components:      Result Value   Glucose-Capillary 306 (*)    All other components within normal limits  CBC WITH DIFFERENTIAL/PLATELET  COMPREHENSIVE METABOLIC PANEL  LIPASE, BLOOD  URINALYSIS, ROUTINE W REFLEX MICROSCOPIC  BLOOD GAS, VENOUS  RAPID URINE DRUG SCREEN, HOSP PERFORMED  I-STAT BETA HCG BLOOD, ED ([facility], [facility], [facility] ONLY)    EKG None  Radiology No results found.  Procedures Procedures (including critical care time)  Medications Ordered in ED Medications  sodium chloride 0.9 % bolus 2,000 mL (has no administration in time range)  0.9 %  sodium chloride infusion (has no administration in time range)  metoCLOPramide (REGLAN) injection 10 mg (has no administration in time range)  LORazepam (ATIVAN) injection 2 mg (has no administration in time range)      Initial Impression / Assessment and Plan / ED Course  I have reviewed the triage vital signs and the nursing notes.  Pertinent labs & imaging results that were available during my care of the patient were reviewed by me and considered in my medical decision making (see chart for details).        Patient has no signs of DKA here.  UDS is positive THC.  Medicated with Reglan as well as Ativan.  Patient with mild hypokalemia at 3.2 but this appears to be her baseline.  She does have some hyperglycemia which was treated with insulin and patient hydrated with 2 L of saline here.  She is requesting narcotic analgesia at this time.  I have told her that I do not think that this is in her best interest but have offered her other therapies which she has refused at this time.  I told her that we could probably have her admitted to the hospital for further management she states she is rather go home and manages herself.  She was encouraged to return to the hospital if she should change her mind  Final Clinical Impressions(s) / ED Diagnoses   Final diagnoses:  None    ED Discharge Orders    None       Lacretia Leigh, MD 10/13/18 2002

## 2018-10-13 NOTE — ED Notes (Signed)
Unsuccessful IV attempt x2.  

## 2018-10-13 NOTE — ED Triage Notes (Signed)
Pt c/o abd pain x 2 days. Pt states pain is generalize.  Pt pretended to pass out in triage. Pt clearly was not. Pt crying throughout triage.

## 2018-10-17 ENCOUNTER — Other Ambulatory Visit: Payer: Self-pay

## 2018-10-17 ENCOUNTER — Encounter (HOSPITAL_COMMUNITY): Payer: Self-pay

## 2018-10-17 ENCOUNTER — Emergency Department (HOSPITAL_COMMUNITY)
Admission: EM | Admit: 2018-10-17 | Discharge: 2018-10-17 | Disposition: A | Discharge status: Home / Self Care | Payer: 59 | Source: Home / Self Care | Attending: Emergency Medicine | Admitting: Emergency Medicine

## 2018-10-17 DIAGNOSIS — E876 Hypokalemia: Secondary | ICD-10-CM | POA: Insufficient documentation

## 2018-10-17 DIAGNOSIS — K3184 Gastroparesis: Secondary | ICD-10-CM

## 2018-10-17 DIAGNOSIS — E1143 Type 2 diabetes mellitus with diabetic autonomic (poly)neuropathy: Secondary | ICD-10-CM

## 2018-10-17 DIAGNOSIS — R0602 Shortness of breath: Secondary | ICD-10-CM | POA: Diagnosis not present

## 2018-10-17 DIAGNOSIS — E1043 Type 1 diabetes mellitus with diabetic autonomic (poly)neuropathy: Secondary | ICD-10-CM | POA: Diagnosis not present

## 2018-10-17 LAB — COMPREHENSIVE METABOLIC PANEL
ALT: 49 U/L — ABNORMAL HIGH (ref 0–44)
AST: 24 U/L (ref 15–41)
Albumin: 4.1 g/dL (ref 3.5–5.0)
Alkaline Phosphatase: 97 U/L (ref 38–126)
Anion gap: 17 — ABNORMAL HIGH (ref 5–15)
BUN: 18 mg/dL (ref 6–20)
CO2: 33 mmol/L — ABNORMAL HIGH (ref 22–32)
Calcium: 8.7 mg/dL — ABNORMAL LOW (ref 8.9–10.3)
Chloride: 85 mmol/L — ABNORMAL LOW (ref 98–111)
Creatinine, Ser: 0.88 mg/dL (ref 0.44–1.00)
GFR calc Af Amer: >60 mL/min (ref >60)
GFR calc non Af Amer: >60 mL/min (ref >60)
Glucose, Bld: 369 mg/dL — ABNORMAL HIGH (ref 70–99)
Potassium: 2.4 mmol/L — CL (ref 3.5–5.1)
Sodium: 135 mmol/L (ref 135–145)
Total Bilirubin: 0.9 mg/dL (ref 0.3–1.2)
Total Protein: 8.2 g/dL — ABNORMAL HIGH (ref 6.5–8.1)

## 2018-10-17 LAB — CBC WITH DIFFERENTIAL/PLATELET
Abs Immature Granulocytes: 0.01 10*3/uL (ref 0.00–0.07)
Basophils Absolute: 0 10*3/uL (ref 0.0–0.1)
Basophils Relative: 0 %
Eosinophils Absolute: 0 10*3/uL (ref 0.0–0.5)
Eosinophils Relative: 0 %
HCT: 35.5 % — ABNORMAL LOW (ref 36.0–46.0)
Hemoglobin: 11.7 g/dL — ABNORMAL LOW (ref 12.0–15.0)
Immature Granulocytes: 0 %
Lymphocytes Relative: 38 %
Lymphs Abs: 2.1 10*3/uL (ref 0.7–4.0)
MCH: 27.6 pg (ref 26.0–34.0)
MCHC: 33 g/dL (ref 30.0–36.0)
MCV: 83.7 fL (ref 80.0–100.0)
Monocytes Absolute: 0.5 10*3/uL (ref 0.1–1.0)
Monocytes Relative: 8 %
Neutro Abs: 3 10*3/uL (ref 1.7–7.7)
Neutrophils Relative %: 54 %
Platelets: 238 10*3/uL (ref 150–400)
RBC: 4.24 MIL/uL (ref 3.87–5.11)
RDW: 12.9 % (ref 11.5–15.5)
WBC: 5.6 10*3/uL (ref 4.0–10.5)
nRBC: 0 % (ref 0.0–0.2)

## 2018-10-17 LAB — CBG MONITORING, ED: Glucose-Capillary: 311 mg/dL — ABNORMAL HIGH (ref 70–99)

## 2018-10-17 MED ORDER — MAGNESIUM SULFATE 2 GM/50ML IV SOLN
2.0000 g | Freq: Once | INTRAVENOUS | Status: AC
  Administered 2018-10-17: 2 g via INTRAVENOUS
  Filled 2018-10-17: qty 50

## 2018-10-17 MED ORDER — SODIUM CHLORIDE 0.9 % IV SOLN
INTRAVENOUS | Status: DC | PRN
  Administered 2018-10-17: 16:00:00 via INTRAVENOUS

## 2018-10-17 MED ORDER — POTASSIUM CHLORIDE 10 MEQ/100ML IV SOLN
10.0000 meq | INTRAVENOUS | Status: AC
  Administered 2018-10-17 (×2): 10 meq via INTRAVENOUS
  Filled 2018-10-17 (×2): qty 100

## 2018-10-17 MED ORDER — POTASSIUM CHLORIDE ER 20 MEQ PO TBCR: 20.0000 meq | EXTENDED_RELEASE_TABLET | Freq: Two times a day (BID) | ORAL | 0 refills | Status: DC

## 2018-10-17 MED ORDER — POTASSIUM CHLORIDE CRYS ER 20 MEQ PO TBCR
40.0000 meq | EXTENDED_RELEASE_TABLET | Freq: Once | ORAL | Status: AC
  Administered 2018-10-17: 40 meq via ORAL
  Filled 2018-10-17: qty 2

## 2018-10-17 MED ORDER — LORAZEPAM 2 MG/ML IJ SOLN
0.5000 mg | Freq: Once | INTRAMUSCULAR | Status: AC
  Administered 2018-10-17: 0.5 mg via INTRAVENOUS
  Filled 2018-10-17: qty 1

## 2018-10-17 MED ORDER — SODIUM CHLORIDE 0.9 % IV BOLUS
2000.0000 mL | Freq: Once | INTRAVENOUS | Status: AC
  Administered 2018-10-17: 2000 mL via INTRAVENOUS

## 2018-10-17 MED ORDER — HYDROMORPHONE HCL 1 MG/ML IJ SOLN
1.0000 mg | Freq: Once | INTRAMUSCULAR | Status: AC
  Administered 2018-10-17: 1 mg via INTRAMUSCULAR
  Filled 2018-10-17: qty 1

## 2018-10-17 MED ORDER — PROMETHAZINE HCL 25 MG RE SUPP: 25.0000 mg | Freq: Three times a day (TID) | RECTAL | 0 refills | Status: DC | PRN

## 2018-10-17 MED ORDER — PROMETHAZINE HCL 25 MG/ML IJ SOLN
25.0000 mg | Freq: Once | INTRAMUSCULAR | Status: AC
  Administered 2018-10-17: 25 mg via INTRAMUSCULAR
  Filled 2018-10-17: qty 1

## 2018-10-17 NOTE — ED Provider Notes (Signed)
Pt signed out by Dr. Roderic Palau.  We wanted her to stay due to the hypokalemia, but she is feeling much better and wants to go home.  She has been able to tolerate some Sprite while here.  She knows that she can return at any time.  F/u with pcp.   Isla Pence, MD 10/17/18 1725

## 2018-10-17 NOTE — ED Triage Notes (Signed)
She states "I have been vomiting whenever I try to eat--I'm no better than when I was here before". She is alert and in no distress.

## 2018-10-17 NOTE — ED Provider Notes (Signed)
[name] Provider Note   CSN: [id number] Arrival date & time: 10/17/18  1138     History   Chief Complaint Chief Complaint  Patient presents with  . Emesis    HPI [name] is a 28 y.o. female.     Patient has a history of diabetes and gastroparesis.  She states she is been throwing up for a few days.  And has some abdominal discomfort.  The history is provided by the patient. No language interpreter was used.  Emesis Severity:  Moderate Timing:  Constant Quality:  Bilious material Able to tolerate:  Liquids Progression:  Worsening Chronicity:  Recurrent Recent urination:  Normal Context: not post-tussive   Relieved by:  Nothing Worsened by:  Nothing Associated symptoms: no abdominal pain, no cough, no diarrhea and no headaches     Past Medical History:  Diagnosis Date  . Allergy   . Anemia   . Anxiety   . Blood transfusion without reported diagnosis    Dec 2018  . Cataract    right eye  . Depression   . Diabetes type 1, uncontrolled (HCC) 11/14/2011   Since age 71  . Fibromyalgia   . Gastroparesis   . GERD (gastroesophageal reflux disease)   . Hypertension   . Infection    UTI April 2016    Patient Active Problem List   Diagnosis Date Noted  . DKA, type 1 (HCC) 09/18/2018  . Pneumonia 08/20/2018  . Contraception management 08/06/2018  . Intractable vomiting with nausea 07/30/2018  . Intractable vomiting   . Cyclical vomiting syndrome 06/07/2018  . Diabetic gastroparesis associated with type 1 diabetes mellitus (HCC) 06/04/2018  . Anxiety 05/31/2018  . Peripheral edema 05/31/2018  . Acute kidney injury (HCC) 03/22/2018  . Normocytic anemia 07/10/2017  . GERD (gastroesophageal reflux disease) 06/23/2017  . Intractable nausea and vomiting 04/11/2017  . 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 Diabetes mellitus type 1 (HCC) 04/28/2013  . Protein-calorie malnutrition,  severe (HCC) 04/28/2013  . Hypertension associated with diabetes (HCC) 11/14/2011  . Chronic abdominal pain 09/18/2011    Past Surgical History:  Procedure Laterality Date  . ANKLE SURGERY    . CHOLECYSTECTOMY  11/15/2011   Procedure: LAPAROSCOPIC CHOLECYSTECTOMY WITH INTRAOPERATIVE CHOLANGIOGRAM;  Surgeon: [name], MD;  Location: [facility];  Service: General;  Laterality: N/A;  . COLONOSCOPY    . COLONOSCOPY WITH PROPOFOL N/A 06/27/2017   Procedure: COLONOSCOPY WITH PROPOFOL;  Surgeon: Rachael Fee, MD;  Location: [facility];  Service: Endoscopy;  Laterality: N/A;  . ESOPHAGOGASTRODUODENOSCOPY  12/03/2011   Procedure: ESOPHAGOGASTRODUODENOSCOPY (EGD);  Surgeon: Theda Belfast, MD;  Location: [facility];  Service: Endoscopy;  Laterality: N/A;  . FLEXIBLE SIGMOIDOSCOPY N/A 03/10/2017   Procedure: FLEXIBLE SIGMOIDOSCOPY;  Surgeon: Jeani Hawking, MD;  [facility];  Service: Endoscopy;  Laterality: N/A;  . INCISION AND DRAINAGE PERIRECTAL ABSCESS N/A 03/01/2017   Procedure: IRRIGATION AND DEBRIDEMENT PERIRECTAL ABSCESS;  Surgeon: Ovidio Kin, MD;  Location: [facility];  Service: General;  Laterality: N/A;  . IRRIGATION AND DEBRIDEMENT BUTTOCKS N/A 03/23/2017   Procedure: IRRIGATION AND DEBRIDEMENT BUTTOCKS, SETON PLACEMENT;  Surgeon: Romie Levee, MD;  Location: [facility];  Service: General;  Laterality: N/A;  . LAPAROSCOPY  11/23/2011   Procedure: LAPAROSCOPY DIAGNOSTIC;  Surgeon: Mariella Saa, MD;  Location: [facility];  Service: General;  Laterality: N/A;  . SIGMOIDOSCOPY    . UPPER GASTROINTESTINAL ENDOSCOPY    . WISDOM  TOOTH EXTRACTION       OB History    Gravida  1   Para  1   Term      Preterm  1   AB      Living  1     SAB      TAB      Ectopic      Multiple  0   Live Births  1            Home Medications    Prior to Admission medications   Medication Sig Start Date End Date Taking? Authorizing Provider  Continuous Blood Gluc Sensor  (FREESTYLE LIBRE 14 DAY SENSOR) MISC 1 Device by Does not apply route every 14 (fourteen) days. 06/22/18  Yes Ardith DarkParker, Caleb M, MD  diclofenac (VOLTAREN) 75 MG EC tablet Take 1 tablet (75 mg total) by mouth daily. 09/21/18  Yes Glade LloydAlekh, Kshitiz, MD  dicyclomine (BENTYL) 20 MG tablet Take 1 tablet (20 mg total) by mouth 4 (four) times daily -  before meals and at bedtime. 09/21/18  Yes Glade LloydAlekh, Kshitiz, MD  insulin lispro (HUMALOG) 100 UNIT/ML injection Inject 0-0.1 mLs (0-10 Units total) into the skin See admin instructions. Inject 0-10 units subcutaneously three times daily with meals per sliding scale 09/21/18  Yes Alekh, Kshitiz, MD  LANTUS 100 UNIT/ML injection Inject 0.08 mLs (8 Units total) into the skin at bedtime. Patient using Vials 09/21/18  Yes Glade LloydAlekh, Kshitiz, MD  LORazepam (ATIVAN) 0.5 MG tablet TAKE 1 TABLET BY MOUTH  TWICE A DAY AS NEEDED FOR  ANXIETY Patient taking differently: Take 0.5 mg by mouth 2 (two) times daily as needed for anxiety.  08/19/18  Yes Ardith DarkParker, Caleb M, MD  metoCLOPramide (REGLAN) 10 MG tablet Take 1 tablet (10 mg total) by mouth every 6 (six) hours as needed for nausea or vomiting. 02/20/18  Yes Burnadette PopAdhikari, Amrit, MD  mirtazapine (REMERON) 30 MG tablet Take 1 tablet (30 mg total) by mouth at bedtime. 05/31/18  Yes Ardith DarkParker, Caleb M, MD  ondansetron (ZOFRAN) 4 MG tablet Take 1 tablet (4 mg total) by mouth every 8 (eight) hours as needed for nausea or vomiting (If necessary, dont combine with Reglan.). 09/21/18  Yes Glade LloydAlekh, Kshitiz, MD  pantoprazole (PROTONIX) 40 MG tablet Take 1 tablet (40 mg total) by mouth 2 (two) times daily. 06/10/18  Yes Sheikh, Omair Latif, DO  promethazine (PHENERGAN) 12.5 MG suppository Place 1 suppository (12.5 mg total) rectally every 8 (eight) hours as needed for nausea or vomiting. Note: Do not take while taking oral phenergan 09/18/18  Yes Melene PlanFloyd, Dan, DO  promethazine (PHENERGAN) 12.5 MG tablet TAKE 1 TABLET BY MOUTH  EVERY 8 HOURS AS NEEDED FOR NAUSEA AND  VOMITING Patient taking differently: Take 12.5 mg by mouth every 8 (eight) hours as needed for nausea or vomiting.  08/11/18  Yes Armbruster, Willaim RayasSteven P, MD  traMADol (ULTRAM) 50 MG tablet Take 1 tablet (50 mg total) by mouth every 6 (six) hours as needed for moderate pain or severe pain. Patient not taking: Reported on 10/17/2018 09/21/18   Glade LloydAlekh, Kshitiz, MD    Family History Family History  Problem Relation Age of Onset  . Diabetes Mother   . Hypertension Father   . Colon cancer Paternal Grandmother        pt thinks PGM was dx in her 9050's  . Diabetes Paternal Grandmother   . Diabetes Maternal Grandmother   . Diabetes Maternal Grandfather   . Diabetes Paternal Grandfather   .  Diabetes Other   . Esophageal cancer Neg Hx   . Liver cancer Neg Hx   . Pancreatic cancer Neg Hx   . Stomach cancer Neg Hx   . Rectal cancer Neg Hx     Social History Social History   Tobacco Use  . Smoking status: Never Smoker  . Smokeless tobacco: Never Used  Substance Use Topics  . Alcohol use: No    Alcohol/week: 0.0 standard drinks    Frequency: Never  . Drug use: No     Allergies   Other and Lactose intolerance (gi)   Review of Systems Review of Systems  Constitutional: Negative for appetite change and fatigue.  HENT: Negative for congestion, ear discharge and sinus pressure.   Eyes: Negative for discharge.  Respiratory: Negative for cough.   Cardiovascular: Negative for chest pain.  Gastrointestinal: Positive for vomiting. Negative for abdominal pain and diarrhea.  Genitourinary: Negative for frequency and hematuria.  Musculoskeletal: Negative for back pain.  Skin: Negative for rash.  Neurological: Negative for seizures and headaches.  Psychiatric/Behavioral: Negative for hallucinations.     Physical Exam Updated Vital Signs BP (!) 189/105   Pulse (!) 112   Temp 99.1 F (37.3 C) (Oral)   Resp 20   SpO2 100%   Physical Exam Vitals signs and nursing note reviewed.   Constitutional:      Appearance: She is well-developed.  HENT:     Head: Normocephalic.     Nose: Nose normal.  Eyes:     General: No scleral icterus.    Conjunctiva/sclera: Conjunctivae normal.  Neck:     Musculoskeletal: Neck supple.     Thyroid: No thyromegaly.  Cardiovascular:     Rate and Rhythm: Normal rate and regular rhythm.     Heart sounds: No murmur. No friction rub. No gallop.   Pulmonary:     Breath sounds: No stridor. No wheezing or rales.  Chest:     Chest wall: No tenderness.  Abdominal:     General: There is no distension.     Tenderness: There is no abdominal tenderness. There is no rebound.  Musculoskeletal: Normal range of motion.  Lymphadenopathy:     Cervical: No cervical adenopathy.  Skin:    Findings: No erythema or rash.  Neurological:     Mental Status: She is alert and oriented to person, place, and time.     Motor: No abnormal muscle tone.     Coordination: Coordination normal.  Psychiatric:        Behavior: Behavior normal.      ED Treatments / Results  Labs (all labs ordered are listed, but only abnormal results are displayed) Labs Reviewed  CBC WITH DIFFERENTIAL/PLATELET - Abnormal; Notable for the following components:      Result Value   Hemoglobin 11.7 (*)    HCT 35.5 (*)    All other components within normal limits  CBG MONITORING, ED - Abnormal; Notable for the following components:   Glucose-Capillary 311 (*)    All other components within normal limits  COMPREHENSIVE METABOLIC PANEL    EKG None  Radiology No results found.  Procedures Procedures (including critical care time)  Medications Ordered in ED Medications  sodium chloride 0.9 % bolus 2,000 mL (2,000 mLs Intravenous New Bag/Given 10/17/18 1432)  LORazepam (ATIVAN) injection 0.5 mg (0.5 mg Intravenous Given 10/17/18 1434)  promethazine (PHENERGAN) injection 25 mg (25 mg Intramuscular Given 10/17/18 1436)  HYDROmorphone (DILAUDID) injection 1 mg (1 mg  Intramuscular Given 10/17/18  1435)     Initial Impression / Assessment and Plan / ED Course  I have reviewed the triage vital signs and the nursing notes.  Pertinent labs & imaging results that were available during my care of the patient were reviewed by me and considered in my medical decision making (see chart for details).        Patient with diabetes and gastroparesis.  And improved with some pain medicine nausea medicine and Ativan and fluids.  CBC unremarkable chemistries pending Patient with gastroparesis and hypokalemia Final Clinical Impressions(s) / ED Diagnoses   Final diagnoses:  None    ED Discharge Orders    None       Milton Ferguson, MD 10/20/18 [phone]

## 2018-10-17 NOTE — ED Notes (Signed)
CRITICAL VALUE STICKER  CRITICAL VALUE: K 2.4  RECEIVER (on-site recipient of call): Epifanio Lesches  DATE & TIME NOTIFIED: 10/17/2018 1526  MESSENGER (representative from lab): Vince  MD NOTIFIED: Roderic Palau  TIME OF NOTIFICATION: 1729  RESPONSE: Verbal order for 2 runs of 10 mEq potassium

## 2018-10-19 ENCOUNTER — Other Ambulatory Visit: Payer: Self-pay

## 2018-10-19 ENCOUNTER — Encounter (HOSPITAL_COMMUNITY): Payer: Self-pay

## 2018-10-19 ENCOUNTER — Inpatient Hospital Stay (HOSPITAL_COMMUNITY)
Admission: EM | Admit: 2018-10-19 | Discharge: 2018-10-23 | DRG: 074 | Disposition: A | Discharge status: Home / Self Care | Payer: 59 | Attending: Internal Medicine | Admitting: Internal Medicine

## 2018-10-19 ENCOUNTER — Emergency Department (HOSPITAL_COMMUNITY): Payer: 59

## 2018-10-19 DIAGNOSIS — E876 Hypokalemia: Secondary | ICD-10-CM | POA: Diagnosis not present

## 2018-10-19 DIAGNOSIS — E739 Lactose intolerance, unspecified: Secondary | ICD-10-CM | POA: Diagnosis present

## 2018-10-19 DIAGNOSIS — Z888 Allergy status to other drugs, medicaments and biological substances status: Secondary | ICD-10-CM

## 2018-10-19 DIAGNOSIS — Z833 Family history of diabetes mellitus: Secondary | ICD-10-CM

## 2018-10-19 DIAGNOSIS — K5900 Constipation, unspecified: Secondary | ICD-10-CM

## 2018-10-19 DIAGNOSIS — E1159 Type 2 diabetes mellitus with other circulatory complications: Secondary | ICD-10-CM | POA: Diagnosis present

## 2018-10-19 DIAGNOSIS — E1069 Type 1 diabetes mellitus with other specified complication: Secondary | ICD-10-CM | POA: Diagnosis not present

## 2018-10-19 DIAGNOSIS — I10 Essential (primary) hypertension: Secondary | ICD-10-CM

## 2018-10-19 DIAGNOSIS — Z79891 Long term (current) use of opiate analgesic: Secondary | ICD-10-CM

## 2018-10-19 DIAGNOSIS — R112 Nausea with vomiting, unspecified: Secondary | ICD-10-CM

## 2018-10-19 DIAGNOSIS — D649 Anemia, unspecified: Secondary | ICD-10-CM | POA: Diagnosis present

## 2018-10-19 DIAGNOSIS — K3184 Gastroparesis: Secondary | ICD-10-CM

## 2018-10-19 DIAGNOSIS — E109 Type 1 diabetes mellitus without complications: Secondary | ICD-10-CM | POA: Diagnosis present

## 2018-10-19 DIAGNOSIS — E1043 Type 1 diabetes mellitus with diabetic autonomic (poly)neuropathy: Secondary | ICD-10-CM | POA: Diagnosis not present

## 2018-10-19 DIAGNOSIS — Z9049 Acquired absence of other specified parts of digestive tract: Secondary | ICD-10-CM

## 2018-10-19 DIAGNOSIS — Z79899 Other long term (current) drug therapy: Secondary | ICD-10-CM

## 2018-10-19 DIAGNOSIS — Z800 Family history of malignant neoplasm of digestive organs: Secondary | ICD-10-CM

## 2018-10-19 DIAGNOSIS — Z794 Long term (current) use of insulin: Secondary | ICD-10-CM

## 2018-10-19 DIAGNOSIS — Z8249 Family history of ischemic heart disease and other diseases of the circulatory system: Secondary | ICD-10-CM

## 2018-10-19 DIAGNOSIS — F329 Major depressive disorder, single episode, unspecified: Secondary | ICD-10-CM | POA: Diagnosis present

## 2018-10-19 DIAGNOSIS — I152 Hypertension secondary to endocrine disorders: Secondary | ICD-10-CM | POA: Diagnosis present

## 2018-10-19 DIAGNOSIS — Z20828 Contact with and (suspected) exposure to other viral communicable diseases: Secondary | ICD-10-CM | POA: Diagnosis present

## 2018-10-19 DIAGNOSIS — M797 Fibromyalgia: Secondary | ICD-10-CM | POA: Diagnosis present

## 2018-10-19 DIAGNOSIS — F419 Anxiety disorder, unspecified: Secondary | ICD-10-CM | POA: Diagnosis present

## 2018-10-19 DIAGNOSIS — Z789 Other specified health status: Secondary | ICD-10-CM

## 2018-10-19 DIAGNOSIS — K219 Gastro-esophageal reflux disease without esophagitis: Secondary | ICD-10-CM | POA: Diagnosis present

## 2018-10-19 DIAGNOSIS — E1143 Type 2 diabetes mellitus with diabetic autonomic (poly)neuropathy: Secondary | ICD-10-CM | POA: Diagnosis present

## 2018-10-19 DIAGNOSIS — E10649 Type 1 diabetes mellitus with hypoglycemia without coma: Secondary | ICD-10-CM | POA: Diagnosis not present

## 2018-10-19 DIAGNOSIS — G8929 Other chronic pain: Secondary | ICD-10-CM | POA: Diagnosis present

## 2018-10-19 LAB — COMPREHENSIVE METABOLIC PANEL
ALT: 31 U/L (ref 0–44)
AST: 23 U/L (ref 15–41)
Albumin: 3.5 g/dL (ref 3.5–5.0)
Alkaline Phosphatase: 76 U/L (ref 38–126)
Anion gap: 12 (ref 5–15)
BUN: 16 mg/dL (ref 6–20)
CO2: 35 mmol/L — ABNORMAL HIGH (ref 22–32)
Calcium: 8.9 mg/dL (ref 8.9–10.3)
Chloride: 91 mmol/L — ABNORMAL LOW (ref 98–111)
Creatinine, Ser: 1.28 mg/dL — ABNORMAL HIGH (ref 0.44–1.00)
GFR calc Af Amer: >60 mL/min (ref >60)
GFR calc non Af Amer: 56 mL/min — ABNORMAL LOW (ref >60)
Glucose, Bld: 142 mg/dL — ABNORMAL HIGH (ref 70–99)
Potassium: 2.7 mmol/L — CL (ref 3.5–5.1)
Sodium: 138 mmol/L (ref 135–145)
Total Bilirubin: 0.8 mg/dL (ref 0.3–1.2)
Total Protein: 7 g/dL (ref 6.5–8.1)

## 2018-10-19 LAB — URINALYSIS, ROUTINE W REFLEX MICROSCOPIC
Bacteria, UA: NONE SEEN
Bilirubin Urine: NEGATIVE
Glucose, UA: NEGATIVE mg/dL
Ketones, ur: 5 mg/dL — AB
Leukocytes,Ua: NEGATIVE
Nitrite: NEGATIVE
Protein, ur: 100 mg/dL — AB
Specific Gravity, Urine: 1.01 (ref 1.005–1.030)
pH: 6 (ref 5.0–8.0)

## 2018-10-19 LAB — LIPASE, BLOOD: Lipase: 24 U/L (ref 11–51)

## 2018-10-19 LAB — I-STAT BETA HCG BLOOD, ED (NOT ORDERABLE): I-stat hCG, quantitative: <5 m[IU]/mL (ref <5)

## 2018-10-19 LAB — TROPONIN I (HIGH SENSITIVITY): Troponin I (High Sensitivity): 7 ng/L (ref <18)

## 2018-10-19 LAB — CBC
HCT: 34.2 % — ABNORMAL LOW (ref 36.0–46.0)
Hemoglobin: 11.1 g/dL — ABNORMAL LOW (ref 12.0–15.0)
MCH: 27.7 pg (ref 26.0–34.0)
MCHC: 32.5 g/dL (ref 30.0–36.0)
MCV: 85.3 fL (ref 80.0–100.0)
Platelets: 232 10*3/uL (ref 150–400)
RBC: 4.01 MIL/uL (ref 3.87–5.11)
RDW: 13 % (ref 11.5–15.5)
WBC: 4.5 10*3/uL (ref 4.0–10.5)
nRBC: 0 % (ref 0.0–0.2)

## 2018-10-19 LAB — PHOSPHORUS: Phosphorus: 4.4 mg/dL (ref 2.5–4.6)

## 2018-10-19 LAB — CBG MONITORING, ED: Glucose-Capillary: 153 mg/dL — ABNORMAL HIGH (ref 70–99)

## 2018-10-19 LAB — SARS CORONAVIRUS 2 BY RT PCR (HOSPITAL ORDER, PERFORMED IN ~~LOC~~ HOSPITAL LAB): SARS Coronavirus 2: NEGATIVE

## 2018-10-19 LAB — MAGNESIUM: Magnesium: 1.9 mg/dL (ref 1.7–2.4)

## 2018-10-19 IMAGING — CR CHEST - 2 VIEW
2 series · 2 of 2 positions shown · non-contrast
Comparison: [DATE]

CLINICAL DATA: Chest pain, history hypertension, diabetes mellitus

EXAM:
CHEST - 2 VIEW

[w chest lat]
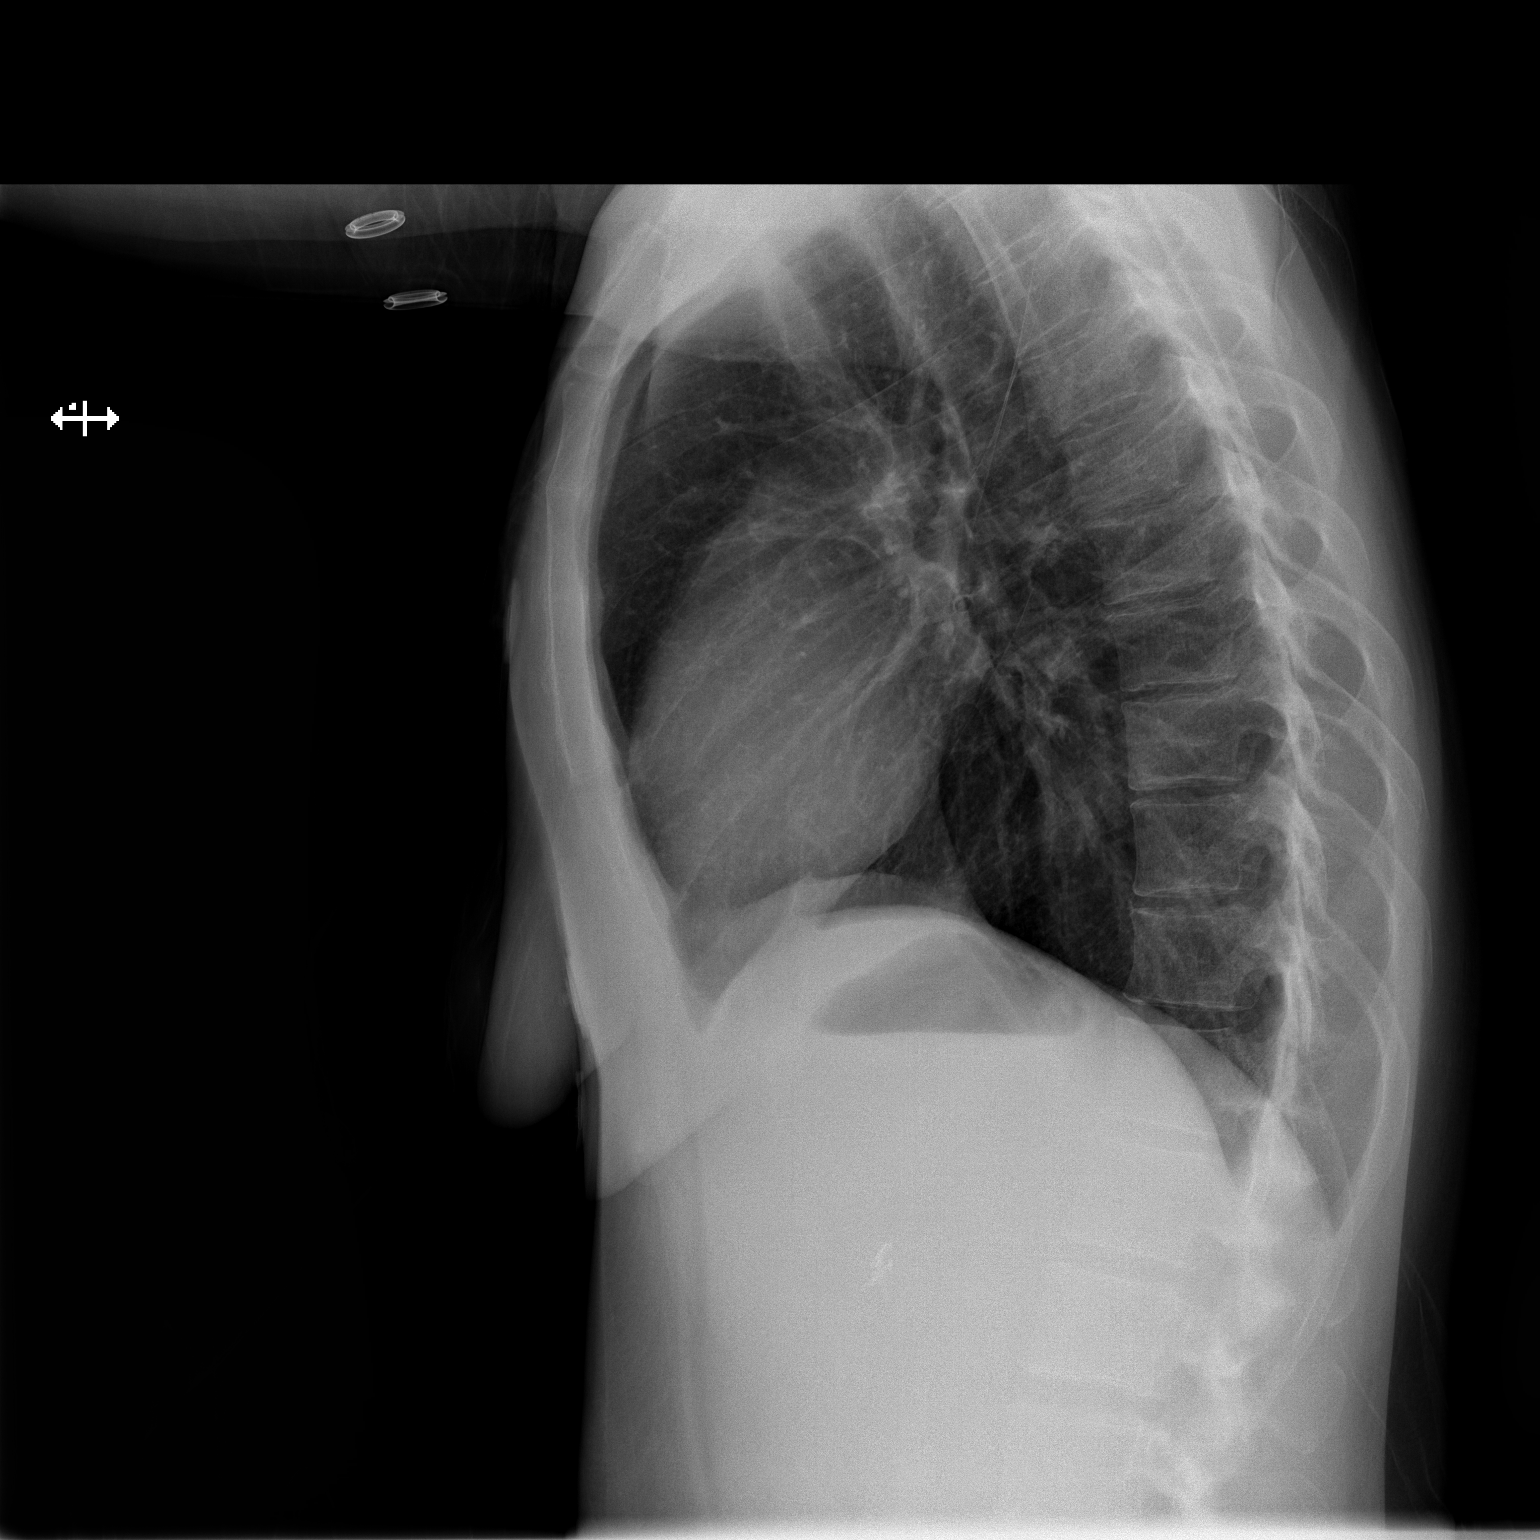

[w chest pa]
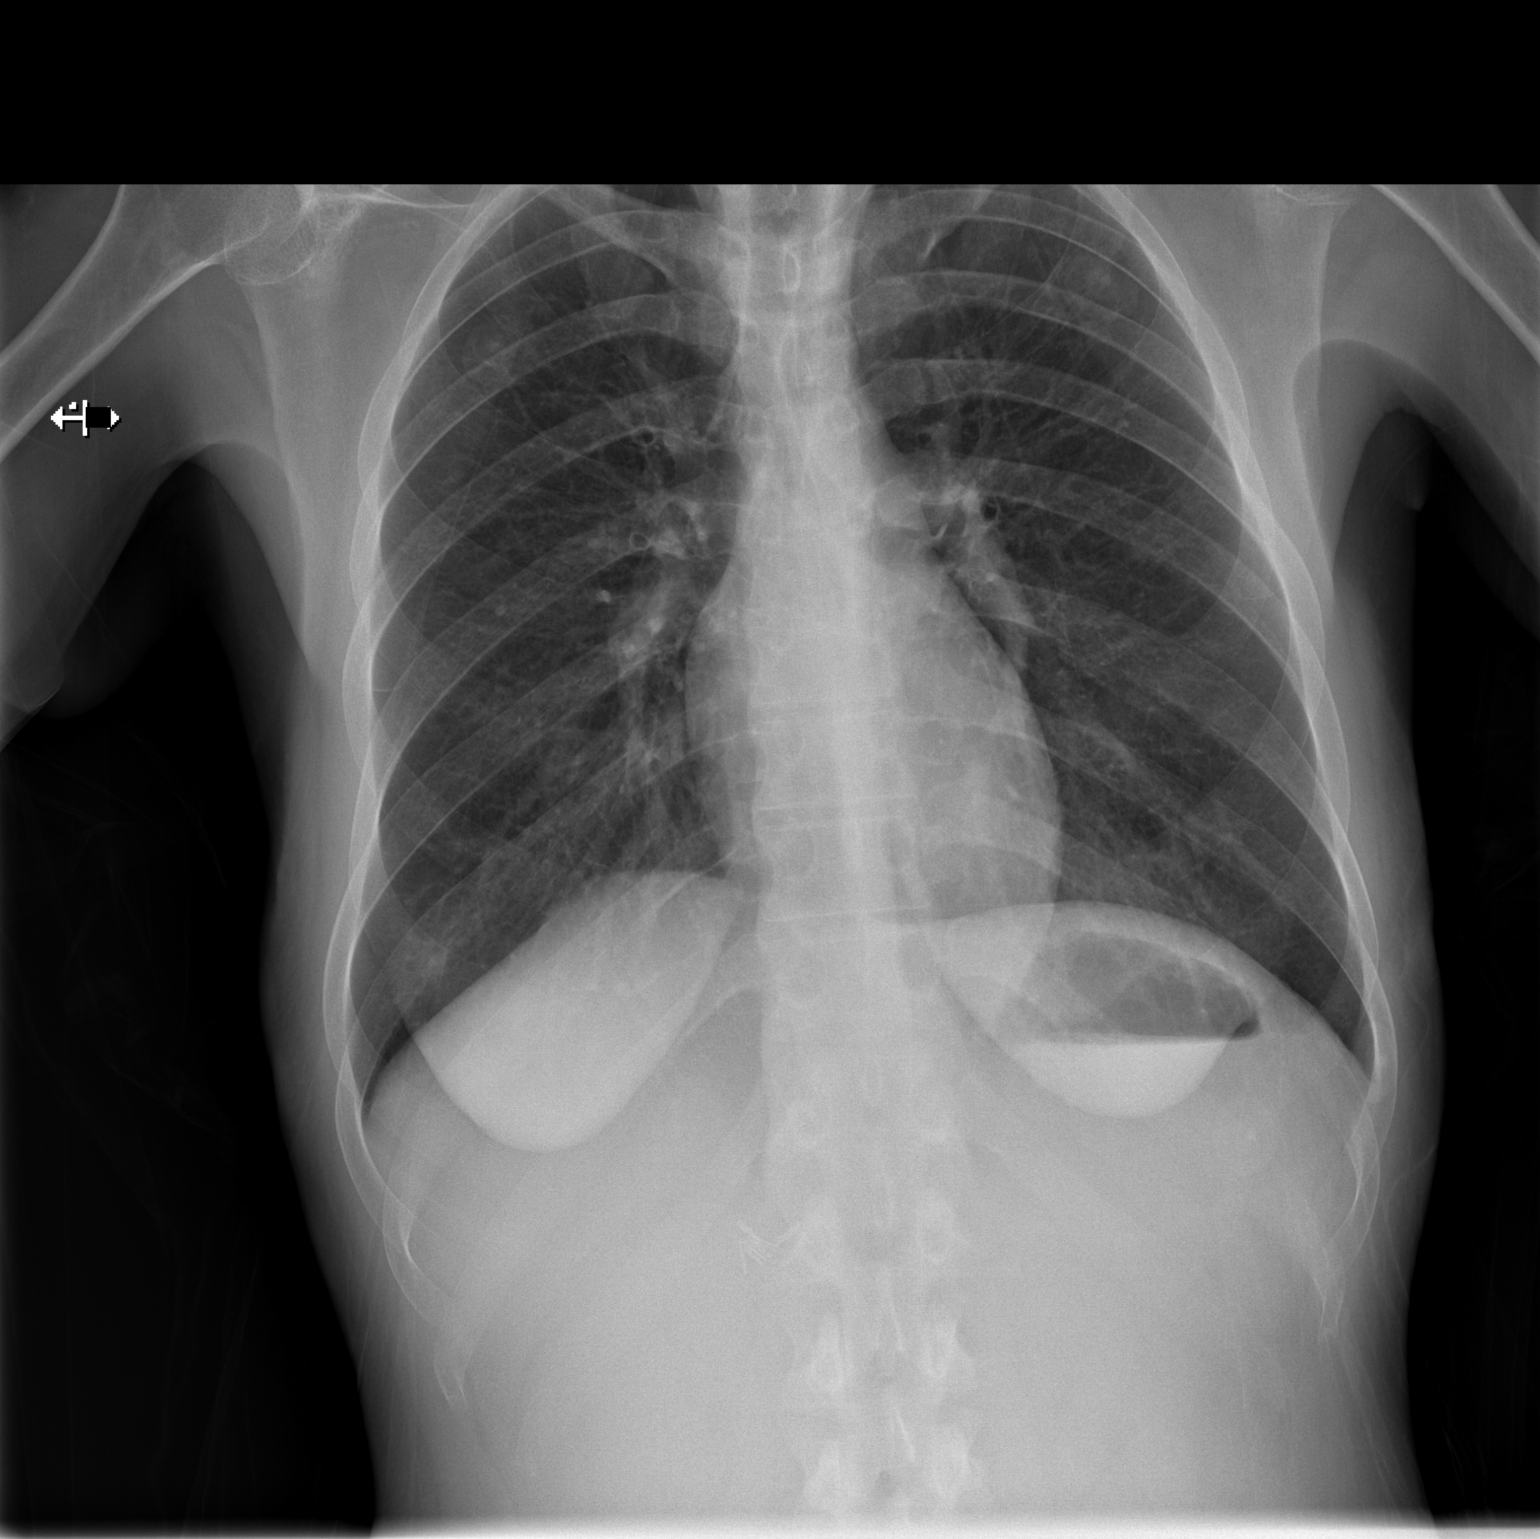

[2 of 2 positions shown; findings below may reference images not displayed]

FINDINGS: EKG leads project over the upper lobes bilaterally.

Normal heart size, mediastinal contours, and pulmonary vascularity.

Mild chronic bronchitic changes.

Lungs otherwise clear.

No pleural effusion or pneumothorax.

Bones unremarkable.
IMPRESSION: Chronic bronchitic changes without infiltrate.

## 2018-10-19 MED ORDER — SODIUM CHLORIDE 0.9 % IV BOLUS
1000.0000 mL | Freq: Once | INTRAVENOUS | Status: AC
  Administered 2018-10-19: 1000 mL via INTRAVENOUS

## 2018-10-19 MED ORDER — POTASSIUM CHLORIDE IN NACL 40-0.9 MEQ/L-% IV SOLN
INTRAVENOUS | Status: AC
  Administered 2018-10-19: 250 mL/h via INTRAVENOUS
  Filled 2018-10-19: qty 1000

## 2018-10-19 MED ORDER — SODIUM CHLORIDE 0.9% FLUSH
3.0000 mL | Freq: Once | INTRAVENOUS | Status: AC
  Administered 2018-10-19: 3 mL via INTRAVENOUS

## 2018-10-19 MED ORDER — ENOXAPARIN SODIUM 40 MG/0.4ML ~~LOC~~ SOLN
40.0000 mg | SUBCUTANEOUS | Status: DC
  Administered 2018-10-20 – 2018-10-23 (×2): 40 mg via SUBCUTANEOUS
  Filled 2018-10-19 (×4): qty 0.4

## 2018-10-19 MED ORDER — LORAZEPAM 2 MG/ML IJ SOLN
1.0000 mg | Freq: Once | INTRAMUSCULAR | Status: AC
  Administered 2018-10-19: 1 mg via INTRAVENOUS
  Filled 2018-10-19: qty 1

## 2018-10-19 MED ORDER — HYDROMORPHONE HCL 1 MG/ML IJ SOLN
1.0000 mg | Freq: Once | INTRAMUSCULAR | Status: AC
  Administered 2018-10-19: 1 mg via INTRAVENOUS
  Filled 2018-10-19: qty 1

## 2018-10-19 MED ORDER — PROMETHAZINE HCL 25 MG/ML IJ SOLN
25.0000 mg | Freq: Once | INTRAMUSCULAR | Status: AC
  Administered 2018-10-19: 25 mg via INTRAVENOUS
  Filled 2018-10-19: qty 1

## 2018-10-19 MED ORDER — POTASSIUM CHLORIDE 10 MEQ/100ML IV SOLN
10.0000 meq | Freq: Once | INTRAVENOUS | Status: AC
  Administered 2018-10-19: 10 meq via INTRAVENOUS
  Filled 2018-10-19: qty 100

## 2018-10-19 MED ORDER — MAGNESIUM SULFATE 2 GM/50ML IV SOLN
2.0000 g | Freq: Once | INTRAVENOUS | Status: AC
  Administered 2018-10-20: 2 g via INTRAVENOUS
  Filled 2018-10-19: qty 50

## 2018-10-19 MED ORDER — SODIUM CHLORIDE 0.9% FLUSH
10.0000 mL | INTRAVENOUS | Status: DC | PRN
  Administered 2018-10-22: 10 mL
  Filled 2018-10-19: qty 40

## 2018-10-19 NOTE — ED Provider Notes (Signed)
[facility] DEPT Provider Note   CSN: [id number] Arrival date & time: 10/19/18  1556    History   Chief Complaint Chief Complaint  Patient presents with  . Abdominal Pain  . Shortness of Breath  . Emesis  . Abnormal Lab    K+    HPI [name] is a 28 y.o. female.     Pt presents to the ED today with n/v and abdominal pain.  Pt has a hx of diabetic gastroparesis.  The pt was seen here on 7/19.  She was hypokalemic and was given 2 runs of IV K and oral K.  I saw her that day.  She said she was feeling much better and wanted to try to go home.  She said she has been only able to keep down 1 yogurt since she was d/c.  She denies any f/c.     Past Medical History:  Diagnosis Date  . Allergy   . Anemia   . Anxiety   . Blood transfusion without reported diagnosis    Dec 2018  . Cataract    right eye  . Depression   . Diabetes type 1, uncontrolled ([place]) 11/14/2011   Since age 73  . Fibromyalgia   . Gastroparesis   . GERD (gastroesophageal reflux disease)   . Hypertension   . Infection    UTI April 2016    Patient Active Problem List   Diagnosis Date Noted  . Diabetic gastroparesis ([place]) 10/19/2018  . DKA, type 1 ([place]) 09/18/2018  . Pneumonia 08/20/2018  . Contraception management 08/06/2018  . Intractable vomiting with nausea 07/30/2018  . Intractable vomiting   . Cyclical vomiting syndrome 06/07/2018  . Diabetic gastroparesis associated with type 1 diabetes mellitus ([place]) 06/04/2018  . Anxiety 05/31/2018  . Peripheral edema 05/31/2018  . Acute kidney injury ([place]) 03/22/2018  . Normocytic anemia 07/10/2017  . GERD (gastroesophageal reflux disease) 06/23/2017  . Intractable nausea and vomiting 04/11/2017  . MDD (major depressive disorder), recurrent episode, mild ([place]) 02/28/2017  . Diabetes mellitus type 1 ([place]) 04/28/2013  . Protein-calorie malnutrition, severe ([place]) 04/28/2013  . Hypertension associated with diabetes  ([place]) 11/14/2011  . Chronic abdominal pain 09/18/2011    Past Surgical History:  Procedure Laterality Date  . ANKLE SURGERY    . CHOLECYSTECTOMY  11/15/2011   Procedure: LAPAROSCOPIC CHOLECYSTECTOMY WITH INTRAOPERATIVE CHOLANGIOGRAM;  Surgeon: [name], MD;  Location: [facility];  Service: General;  Laterality: N/A;  . COLONOSCOPY    . COLONOSCOPY WITH PROPOFOL N/A 06/27/2017   Procedure: COLONOSCOPY WITH PROPOFOL;  Surgeon: Milus Banister, MD;  [facility];  Service: Endoscopy;  Laterality: N/A;  . ESOPHAGOGASTRODUODENOSCOPY  12/03/2011   Procedure: ESOPHAGOGASTRODUODENOSCOPY (EGD);  Surgeon: Beryle Beams, MD;  Location: [facility];  Service: Endoscopy;  Laterality: N/A;  . FLEXIBLE SIGMOIDOSCOPY N/A 03/10/2017   Procedure: FLEXIBLE SIGMOIDOSCOPY;  Surgeon: Carol Ada, MD;  [facility];  Service: Endoscopy;  Laterality: N/A;  . INCISION AND DRAINAGE PERIRECTAL ABSCESS N/A 03/01/2017   Procedure: IRRIGATION AND DEBRIDEMENT PERIRECTAL ABSCESS;  Surgeon: Alphonsa Overall, MD;  Location: [facility];  Service: General;  Laterality: N/A;  . IRRIGATION AND DEBRIDEMENT BUTTOCKS N/A 03/23/2017   Procedure: IRRIGATION AND DEBRIDEMENT BUTTOCKS, SETON PLACEMENT;  Surgeon: [name], MD;  Location: [facility];  Service: General;  Laterality: N/A;  . LAPAROSCOPY  11/23/2011   Procedure: LAPAROSCOPY DIAGNOSTIC;  Surgeon: Edward Jolly, MD;  [facility];  Service: General;  Laterality:  N/A;  . SIGMOIDOSCOPY    . UPPER GASTROINTESTINAL ENDOSCOPY    . WISDOM TOOTH EXTRACTION       OB History    Gravida  1   Para  1   Term      Preterm  1   AB      Living  1     SAB      TAB      Ectopic      Multiple  0   Live Births  1            Home Medications    Prior to Admission medications   Medication Sig Start Date End Date Taking? Authorizing Provider  diclofenac (VOLTAREN) 75 MG EC tablet Take 1 tablet (75 mg total) by mouth daily. 09/21/18  Yes  Aline August, MD  dicyclomine (BENTYL) 20 MG tablet Take 1 tablet (20 mg total) by mouth 4 (four) times daily -  before meals and at bedtime. 09/21/18  Yes Aline August, MD  insulin lispro (HUMALOG) 100 UNIT/ML injection Inject 0-0.1 mLs (0-10 Units total) into the skin See admin instructions. Inject 0-10 units subcutaneously three times daily with meals per sliding scale 09/21/18  Yes Alekh, Kshitiz, MD  LANTUS 100 UNIT/ML injection Inject 0.08 mLs (8 Units total) into the skin at bedtime. Patient using Vials 09/21/18  Yes Aline August, MD  LORazepam (ATIVAN) 0.5 MG tablet TAKE 1 TABLET BY MOUTH  TWICE A DAY AS NEEDED FOR  ANXIETY Patient taking differently: Take 0.5 mg by mouth 2 (two) times daily as needed for anxiety.  08/19/18  Yes [name], MD  metoCLOPramide (REGLAN) 10 MG tablet Take 1 tablet (10 mg total) by mouth every 6 (six) hours as needed for nausea or vomiting. 02/20/18  Yes Shelly Coss, MD  mirtazapine (REMERON) 30 MG tablet Take 1 tablet (30 mg total) by mouth at bedtime. 05/31/18  Yes [name], MD  ondansetron (ZOFRAN) 4 MG tablet Take 1 tablet (4 mg total) by mouth every 8 (eight) hours as needed for nausea or vomiting (If necessary, dont combine with Reglan.). 09/21/18  Yes Aline August, MD  pantoprazole (PROTONIX) 40 MG tablet Take 1 tablet (40 mg total) by mouth 2 (two) times daily. 06/10/18  Yes Sheikh, Omair Latif, DO  potassium chloride 20 MEQ TBCR Take 20 mEq by mouth 2 (two) times daily. 10/17/18  Yes Isla Pence, MD  promethazine (PHENERGAN) 12.5 MG tablet TAKE 1 TABLET BY MOUTH  EVERY 8 HOURS AS NEEDED FOR NAUSEA AND VOMITING Patient taking differently: Take 12.5 mg by mouth every 8 (eight) hours as needed for nausea or vomiting.  08/11/18  Yes Armbruster, Carlota Raspberry, MD  promethazine (PHENERGAN) 25 MG suppository Place 1 suppository (25 mg total) rectally every 8 (eight) hours as needed for nausea or vomiting. 10/17/18  Yes Isla Pence, MD  traMADol  (ULTRAM) 50 MG tablet Take 1 tablet (50 mg total) by mouth every 6 (six) hours as needed for moderate pain or severe pain. 09/21/18  Yes Aline August, MD  Continuous Blood Gluc Sensor (FREESTYLE LIBRE 14 DAY SENSOR) MISC 1 Device by Does not apply route every 14 (fourteen) days. 06/22/18   [name], MD    Family History Family History  Problem Relation Age of Onset  . Diabetes Mother   . Hypertension Father   . Colon cancer Paternal Grandmother        pt thinks PGM was dx in her 29's  .  Diabetes Paternal Grandmother   . Diabetes Maternal Grandmother   . Diabetes Maternal Grandfather   . Diabetes Paternal Grandfather   . Diabetes Other   . Esophageal cancer Neg Hx   . Liver cancer Neg Hx   . Pancreatic cancer Neg Hx   . Stomach cancer Neg Hx   . Rectal cancer Neg Hx     Social History Social History   Tobacco Use  . Smoking status: Never Smoker  . Smokeless tobacco: Never Used  Substance Use Topics  . Alcohol use: No    Alcohol/week: 0.0 standard drinks    Frequency: Never  . Drug use: No     Allergies   Other and Lactose intolerance (gi)   Review of Systems Review of Systems  Gastrointestinal: Positive for abdominal pain, nausea and vomiting.  All other systems reviewed and are negative.    Physical Exam Updated Vital Signs BP (!) 177/119   Pulse (!) 106   Temp 99.4 F (37.4 C) (Oral)   Resp 13   Ht 5' 7"  (1.702 m)   Wt 59 kg   SpO2 99%   BMI 20.36 kg/m   Physical Exam Vitals signs and nursing note reviewed.  Constitutional:      Appearance: She is well-developed.  HENT:     Head: Normocephalic and atraumatic.     Mouth/Throat:     Mouth: Mucous membranes are dry.  Eyes:     Extraocular Movements: Extraocular movements intact.     Pupils: Pupils are equal, round, and reactive to light.  Cardiovascular:     Rate and Rhythm: Normal rate and regular rhythm.     Heart sounds: Normal heart sounds.  Pulmonary:     Effort: Pulmonary effort  is normal.     Breath sounds: Normal breath sounds.  Abdominal:     General: Abdomen is flat. Bowel sounds are normal.     Palpations: Abdomen is soft.     Tenderness: There is generalized abdominal tenderness.  Skin:    General: Skin is warm.     Capillary Refill: Capillary refill takes less than 2 seconds.  Neurological:     General: No focal deficit present.     Mental Status: She is alert and oriented to person, place, and time.  Psychiatric:        Mood and Affect: Mood normal.        Behavior: Behavior normal.      ED Treatments / Results  Labs (all labs ordered are listed, but only abnormal results are displayed) Labs Reviewed  COMPREHENSIVE METABOLIC PANEL - Abnormal; Notable for the following components:      Result Value   Potassium 2.7 (*)    Chloride 91 (*)    CO2 35 (*)    Glucose, Bld 142 (*)    Creatinine, Ser 1.28 (*)    GFR calc non Af Amer 56 (*)    All other components within normal limits  CBC - Abnormal; Notable for the following components:   Hemoglobin 11.1 (*)    HCT 34.2 (*)    All other components within normal limits  URINALYSIS, ROUTINE W REFLEX MICROSCOPIC - Abnormal; Notable for the following components:   APPearance HAZY (*)    Hgb urine dipstick SMALL (*)    Ketones, ur 5 (*)    Protein, ur 100 (*)    All other components within normal limits  CBG MONITORING, ED - Abnormal; Notable for the following components:   Glucose-Capillary 153 (*)  All other components within normal limits  SARS CORONAVIRUS 2 (HOSPITAL ORDER, [facility])  LIPASE, BLOOD  MAGNESIUM  MAGNESIUM  PHOSPHORUS  I-STAT BETA HCG BLOOD, ED ([facility], [facility], [facility] ONLY)  I-STAT BETA HCG BLOOD, ED (NOT ORDERABLE)  TROPONIN I (HIGH SENSITIVITY)  TROPONIN I (HIGH SENSITIVITY)    EKG None  Radiology Dg Chest 2 View  Result Date: 10/19/2018 CLINICAL DATA:  Chest pain, history hypertension, diabetes mellitus EXAM: CHEST - 2 VIEW COMPARISON:   09/18/2018 FINDINGS: EKG leads project over the upper lobes bilaterally. Normal heart size, mediastinal contours, and pulmonary vascularity. Mild chronic bronchitic changes. Lungs otherwise clear. No pleural effusion or pneumothorax. Bones unremarkable. IMPRESSION: Chronic bronchitic changes without infiltrate. Electronically Signed   By: Lavonia Dana M.D.   On: 10/19/2018 17:57    Procedures Procedures (including critical care time)  Medications Ordered in ED Medications  sodium chloride flush (NS) 0.9 % injection 3 mL (has no administration in time range)  HYDROmorphone (DILAUDID) injection 1 mg (has no administration in time range)  promethazine (PHENERGAN) injection 25 mg (has no administration in time range)  LORazepam (ATIVAN) injection 1 mg (has no administration in time range)  sodium chloride 0.9 % bolus 1,000 mL (has no administration in time range)  potassium chloride 10 mEq in 100 mL IVPB (has no administration in time range)     Initial Impression / Assessment and Plan / ED Course  I have reviewed the triage vital signs and the nursing notes.  Pertinent labs & imaging results that were available during my care of the patient were reviewed by me and considered in my medical decision making (see chart for details).      Pt has failed outpatient treatment.  She has been here 7/15, 17, 19, and today.  Dr. Olevia Bowens ([facility]) will admit.   Final Clinical Impressions(s) / ED Diagnoses   Final diagnoses:  Intractable nausea and vomiting  Hypokalemia  Diabetic gastroparesis ([place])  Failure of outpatient treatment    ED Discharge Orders    None       Isla Pence, MD 10/19/18 2145

## 2018-10-19 NOTE — ED Triage Notes (Addendum)
Patient c/o chest tightness, abdominal pain, and emesis x 5-6 days ago. Patient was seen for the same 2 days ago.  CBG-153.

## 2018-10-19 NOTE — ED Notes (Signed)
Pt crying hysterically while I was in the room collecting urine sample

## 2018-10-19 NOTE — ED Notes (Signed)
Attempted to obtain IV access in right AC twice. Failed. Requesting assistance from another RN.

## 2018-10-19 NOTE — ED Notes (Signed)
Pt said she is unable to provide a urine specimen at this time 

## 2018-10-19 NOTE — ED Notes (Signed)
Lab called critical K+ -2.7.

## 2018-10-19 NOTE — ED Notes (Addendum)
Called lab regarding troponin and mag. Lab said they are in process.

## 2018-10-19 NOTE — H&P (Signed)
History and Physical    [name] FHL:[id number] DOB: September 04, 1990 DOA: 10/19/2018  PCP: [name], MD   Patient coming from: Home.  I have personally briefly reviewed patient's old medical records in [facility]  Chief Complaint: Abdominal pain, nausea and vomiting.  HPI: [name] is a 28 y.o. female with medical history significant of allergy, anemia, anxiety, right eye cataract, depression, type 2 diabetes, fibromyalgia, gastroparesis, GERD, hypertension who is returning to the emergency department for the second time in the last 3 days due to abdominal pain with persistent nausea and emesis.  She states that she has been having numerous episodes of emesis a day for about 6 to 7 days and was only able to eat some yogurt yesterday.  She has also been having about 10 episodes of loose stools daily.  She mentions that her urine looks very dark, but no dysuria, frequency or hematuria..  She had a low-grade and some night sweats recently, chills, sore throat, productive cough, wheezing or hemoptysis.  No chest pain, palpitations, dizziness diaphoresis.  Denies polyuria, polydipsia, polyphagia or blurred vision.  ED Course: Initial vital signs temperature 99.4 F, pulse 92, respirations 14, blood pressure 96/68 mmHg and O2 sat 97% on room air.  The patient received IV fluids, 1 mg of hydromorphone and 1 mg of lorazepam.  UA was hazy in appearance, with small hemoglobinuria, ketonuria of 5 and proteinuria of 100 mg/dL.  The rest of the results were unremarkable.  CBC had a white count of 4.5, hemoglobin 11.1 g/dL and platelets 232.  Her chest radiograph showed chronic bronchitic changes without consolidation.  Review of Systems: As per HPI otherwise 10 point review of systems negative.   Past Medical History:  Diagnosis Date  . Allergy   . Anemia   . Anxiety   . Blood transfusion without reported diagnosis    Dec 2018  . Cataract    right eye  . Depression   .  Diabetes type 1, uncontrolled ([place]) 11/14/2011   Since age 21  . Fibromyalgia   . Gastroparesis   . GERD (gastroesophageal reflux disease)   . Hypertension   . Infection    UTI April 2016    Past Surgical History:  Procedure Laterality Date  . ANKLE SURGERY    . CHOLECYSTECTOMY  11/15/2011   Procedure: LAPAROSCOPIC CHOLECYSTECTOMY WITH INTRAOPERATIVE CHOLANGIOGRAM;  Surgeon: [name], MD;  Location: [facility];  Service: General;  Laterality: N/A;  . COLONOSCOPY    . COLONOSCOPY WITH PROPOFOL N/A 06/27/2017   Procedure: COLONOSCOPY WITH PROPOFOL;  Surgeon: Milus Banister, MD;  Location: [facility];  Service: Endoscopy;  Laterality: N/A;  . ESOPHAGOGASTRODUODENOSCOPY  12/03/2011   Procedure: ESOPHAGOGASTRODUODENOSCOPY (EGD);  Surgeon: Beryle Beams, MD;  Location: [facility];  Service: Endoscopy;  Laterality: N/A;  . FLEXIBLE SIGMOIDOSCOPY N/A 03/10/2017   Procedure: FLEXIBLE SIGMOIDOSCOPY;  Surgeon: Carol Ada, MD;  Location: [facility];  Service: Endoscopy;  Laterality: N/A;  . INCISION AND DRAINAGE PERIRECTAL ABSCESS N/A 03/01/2017   Procedure: IRRIGATION AND DEBRIDEMENT PERIRECTAL ABSCESS;  Surgeon: Alphonsa Overall, MD;  Location: [facility];  Service: General;  Laterality: N/A;  . IRRIGATION AND DEBRIDEMENT BUTTOCKS N/A 03/23/2017   Procedure: IRRIGATION AND DEBRIDEMENT BUTTOCKS, SETON PLACEMENT;  Surgeon: [name], MD;  Location: [facility];  Service: General;  Laterality: N/A;  . LAPAROSCOPY  11/23/2011   Procedure: LAPAROSCOPY DIAGNOSTIC;  Surgeon: Edward Jolly, MD;  Location:  [facility];  Service: General;  Laterality: N/A;  . SIGMOIDOSCOPY    . UPPER GASTROINTESTINAL ENDOSCOPY    . WISDOM TOOTH EXTRACTION       reports that she has never smoked. She has never used smokeless tobacco. She reports that she does not drink alcohol or use drugs.  Allergies  Allergen Reactions  . Other Anaphylaxis    Reaction to [place] nuts   . Lactose Intolerance (Gi) Diarrhea     Family History  Problem Relation Age of Onset  . Diabetes Mother   . Hypertension Father   . Colon cancer Paternal Grandmother        pt thinks PGM was dx in her 30's  . Diabetes Paternal Grandmother   . Diabetes Maternal Grandmother   . Diabetes Maternal Grandfather   . Diabetes Paternal Grandfather   . Diabetes Other   . Esophageal cancer Neg Hx   . Liver cancer Neg Hx   . Pancreatic cancer Neg Hx   . Stomach cancer Neg Hx   . Rectal cancer Neg Hx    Prior to Admission medications   Medication Sig Start Date End Date Taking? Authorizing Provider  diclofenac (VOLTAREN) 75 MG EC tablet Take 1 tablet (75 mg total) by mouth daily. 09/21/18  Yes Aline August, MD  dicyclomine (BENTYL) 20 MG tablet Take 1 tablet (20 mg total) by mouth 4 (four) times daily -  before meals and at bedtime. 09/21/18  Yes Aline August, MD  insulin lispro (HUMALOG) 100 UNIT/ML injection Inject 0-0.1 mLs (0-10 Units total) into the skin See admin instructions. Inject 0-10 units subcutaneously three times daily with meals per sliding scale 09/21/18  Yes Alekh, Kshitiz, MD  LANTUS 100 UNIT/ML injection Inject 0.08 mLs (8 Units total) into the skin at bedtime. Patient using Vials 09/21/18  Yes Aline August, MD  LORazepam (ATIVAN) 0.5 MG tablet TAKE 1 TABLET BY MOUTH  TWICE A DAY AS NEEDED FOR  ANXIETY Patient taking differently: Take 0.5 mg by mouth 2 (two) times daily as needed for anxiety.  08/19/18  Yes [name], MD  metoCLOPramide (REGLAN) 10 MG tablet Take 1 tablet (10 mg total) by mouth every 6 (six) hours as needed for nausea or vomiting. 02/20/18  Yes Shelly Coss, MD  mirtazapine (REMERON) 30 MG tablet Take 1 tablet (30 mg total) by mouth at bedtime. 05/31/18  Yes [name], MD  ondansetron (ZOFRAN) 4 MG tablet Take 1 tablet (4 mg total) by mouth every 8 (eight) hours as needed for nausea or vomiting (If necessary, dont combine with Reglan.). 09/21/18  Yes Aline August, MD  pantoprazole  (PROTONIX) 40 MG tablet Take 1 tablet (40 mg total) by mouth 2 (two) times daily. 06/10/18  Yes Sheikh, Omair Latif, DO  potassium chloride 20 MEQ TBCR Take 20 mEq by mouth 2 (two) times daily. 10/17/18  Yes Isla Pence, MD  promethazine (PHENERGAN) 12.5 MG tablet TAKE 1 TABLET BY MOUTH  EVERY 8 HOURS AS NEEDED FOR NAUSEA AND VOMITING Patient taking differently: Take 12.5 mg by mouth every 8 (eight) hours as needed for nausea or vomiting.  08/11/18  Yes Armbruster, Carlota Raspberry, MD  promethazine (PHENERGAN) 25 MG suppository Place 1 suppository (25 mg total) rectally every 8 (eight) hours as needed for nausea or vomiting. 10/17/18  Yes Isla Pence, MD  traMADol (ULTRAM) 50 MG tablet Take 1 tablet (50 mg total) by mouth every 6 (six) hours as needed for moderate pain or severe pain. 09/21/18  Yes Aline August, MD  Continuous Blood Gluc Sensor (FREESTYLE LIBRE 14 DAY SENSOR) MISC 1 Device by Does not apply route every 14 (fourteen) days. 06/22/18   [name], MD    Physical Exam: Vitals:   10/19/18 1930 10/19/18 2000 10/19/18 2030 10/19/18 2247  BP: (!) 177/119 (!) 182/125 (!) 209/145 (!) 159/108  Pulse: (!) 106 (!) 105    Resp: 13 17  13   Temp:      TempSrc:      SpO2: 99% 98%    Weight:      Height:        Constitutional: NAD, calm, comfortable Eyes: PERRL, lids and conjunctivae normal ENMT: Mucous membranes are mildly dry.  Posterior pharynx clear of any exudate or lesions. Neck: normal, supple, no masses, no thyromegaly Respiratory: Decreased breath sounds on bases, otherwise clear to auscultation bilaterally, no wheezing, no crackles. Normal respiratory effort. No accessory muscle use.  Cardiovascular: Tachycardic at 103 bpm, no murmurs / rubs / gallops. No extremity edema. 2+ pedal pulses. No carotid bruits.  Abdomen: Bowel sounds positive. Soft, positive for gastric tenderness, no masses palpated. No hepatosplenomegaly.  Musculoskeletal: no clubbing / cyanosis.  Good ROM, no  contractures. Normal muscle tone.  Skin: no rashes, lesions, ulcers on very limited dermatological examination. Neurologic: CN 2-12 grossly intact. Sensation intact, DTR normal. Strength 5/5 in all 4.  Psychiatric: Normal judgment and insight. Alert and oriented x 3. Normal mood.   Labs on Admission: I have personally reviewed following labs and imaging studies  CBC: Recent Labs  Lab 10/13/18 1738 10/17/18 1400 10/19/18 1738  WBC 4.7 5.6 4.5  NEUTROABS 3.2 3.0  --   HGB 11.8* 11.7* 11.1*  HCT 36.6 35.5* 34.2*  MCV 85.7 83.7 85.3  PLT 201 238 383   Basic Metabolic Panel: Recent Labs  Lab 10/13/18 1738 10/17/18 1400 10/19/18 1738 10/19/18 2200  NA 140 135 138  --   K 3.2* 2.4* 2.7*  --   CL 97* 85* 91*  --   CO2 28 33* 35*  --   GLUCOSE 327* 369* 142*  --   BUN 11 18 16   --   CREATININE 0.68 0.88 1.28*  --   CALCIUM 9.8 8.7* 8.9  --   MG  --   --   --  1.9  PHOS  --   --   --  4.4   GFR: Estimated Creatinine Clearance: 60.4 mL/min (A) (by C-G formula based on SCr of 1.28 mg/dL (H)). Liver Function Tests: Recent Labs  Lab 10/13/18 1738 10/17/18 1400 10/19/18 1738  AST 159* 24 23  ALT 149* 49* 31  ALKPHOS 116 97 76  BILITOT 0.5 0.9 0.8  PROT 9.0* 8.2* 7.0  ALBUMIN 4.3 4.1 3.5   Recent Labs  Lab 10/13/18 1738 10/19/18 1738  LIPASE 25 24   No results for input(s): AMMONIA in the last 168 hours. Coagulation Profile: No results for input(s): INR, PROTIME in the last 168 hours. Cardiac Enzymes: No results for input(s): CKTOTAL, CKMB, CKMBINDEX, TROPONINI in the last 168 hours. BNP (last 3 results) No results for input(s): PROBNP in the last 8760 hours. HbA1C: No results for input(s): HGBA1C in the last 72 hours. CBG: Recent Labs  Lab 10/13/18 1607 10/17/18 1154 10/19/18 1651  GLUCAP 306* 311* 153*   Lipid Profile: No results for input(s): CHOL, HDL, LDLCALC, TRIG, CHOLHDL, LDLDIRECT in the last 72 hours. Thyroid Function Tests: No results for  input(s): TSH, T4TOTAL, FREET4, T3FREE,  THYROIDAB in the last 72 hours. Anemia Panel: No results for input(s): VITAMINB12, FOLATE, FERRITIN, TIBC, IRON, RETICCTPCT in the last 72 hours. Urine analysis:    Component Value Date/Time   COLORURINE YELLOW 10/19/2018 1946   APPEARANCEUR HAZY (A) 10/19/2018 1946   LABSPEC 1.010 10/19/2018 1946   PHURINE 6.0 10/19/2018 1946   GLUCOSEU NEGATIVE 10/19/2018 1946   HGBUR SMALL (A) 10/19/2018 1946   BILIRUBINUR NEGATIVE 10/19/2018 1946   BILIRUBINUR negative 01/02/2015 1717   KETONESUR 5 (A) 10/19/2018 1946   PROTEINUR 100 (A) 10/19/2018 1946   UROBILINOGEN 0.2 01/13/2015 0223   NITRITE NEGATIVE 10/19/2018 1946   LEUKOCYTESUR NEGATIVE 10/19/2018 1946    Radiological Exams on Admission: Dg Chest 2 View  Result Date: 10/19/2018 CLINICAL DATA:  Chest pain, history hypertension, diabetes mellitus EXAM: CHEST - 2 VIEW COMPARISON:  09/18/2018 FINDINGS: EKG leads project over the upper lobes bilaterally. Normal heart size, mediastinal contours, and pulmonary vascularity. Mild chronic bronchitic changes. Lungs otherwise clear. No pleural effusion or pneumothorax. Bones unremarkable. IMPRESSION: Chronic bronchitic changes without infiltrate. Electronically Signed   By: Lavonia Dana M.D.   On: 10/19/2018 17:57    EKG: Independently reviewed.  Vent. rate 90 BPM PR interval * ms QRS duration 91 ms QT/QTc 443/543 ms P-R-T axes 99 103 102 Sinus rhythm Borderline right axis deviation Borderline Q waves in lateral leads Nonspecific T abnrm, anterolateral leads Prolonged QT interval  Assessment/Plan Principal Problem:   Diabetic gastroparesis  associated with type 1 diabetes mellitus ([place]) Observation/telemetry. Keep n.p.o. Continue IV fluids. Continue electrolyte replacement. Pantoprazole every 24 hours. Analgesics as needed. Antiemetics as needed. Anxiolytic as needed.  Active Problems:   Chronic abdominal pain Analgesics as needed.  Antiemetics as needed    Hypertension associated with diabetes ([place]) Metoprolol 5 mg IVP x1. Then metoprolol 5 mg IVP every 6 hours PRN.    Diabetes mellitus type 1 (HCC) Continue Lantus 8 units at bedtime. CBG monitoring with regular insulin sliding scale.    Hypokalemia Replacing. Follow-up potassium level.    GERD (gastroesophageal reflux disease) Protonix 40 mg IVPB every 24 hours. Switch to oral form once tolerating diet.    Normocytic anemia Monitor H&H.    DVT prophylaxis: SCDs. Code Status: Full code. Family Communication: Disposition Plan: Observation for IV hydration and electrolyte replacement. Consults called: Admission status: Observation/telemetry.   Reubin Milan MD [facility]  If 7PM-7AM, please contact night-coverage [email or web address]  10/19/2018, 11:42 PM   This document was prepared using Dragon voice recognition software and may contain some unintended transcription errors.

## 2018-10-19 NOTE — ED Notes (Signed)
IV Team at bedside 

## 2018-10-20 DIAGNOSIS — E876 Hypokalemia: Secondary | ICD-10-CM | POA: Diagnosis present

## 2018-10-20 DIAGNOSIS — E1069 Type 1 diabetes mellitus with other specified complication: Secondary | ICD-10-CM | POA: Diagnosis present

## 2018-10-20 DIAGNOSIS — E1143 Type 2 diabetes mellitus with diabetic autonomic (poly)neuropathy: Secondary | ICD-10-CM

## 2018-10-20 DIAGNOSIS — K219 Gastro-esophageal reflux disease without esophagitis: Secondary | ICD-10-CM | POA: Diagnosis present

## 2018-10-20 DIAGNOSIS — K3184 Gastroparesis: Secondary | ICD-10-CM | POA: Diagnosis present

## 2018-10-20 DIAGNOSIS — D649 Anemia, unspecified: Secondary | ICD-10-CM | POA: Diagnosis present

## 2018-10-20 DIAGNOSIS — Z8249 Family history of ischemic heart disease and other diseases of the circulatory system: Secondary | ICD-10-CM | POA: Diagnosis not present

## 2018-10-20 DIAGNOSIS — E1043 Type 1 diabetes mellitus with diabetic autonomic (poly)neuropathy: Secondary | ICD-10-CM | POA: Diagnosis present

## 2018-10-20 DIAGNOSIS — R109 Unspecified abdominal pain: Secondary | ICD-10-CM | POA: Diagnosis not present

## 2018-10-20 DIAGNOSIS — Z9049 Acquired absence of other specified parts of digestive tract: Secondary | ICD-10-CM | POA: Diagnosis not present

## 2018-10-20 DIAGNOSIS — Z20828 Contact with and (suspected) exposure to other viral communicable diseases: Secondary | ICD-10-CM | POA: Diagnosis present

## 2018-10-20 DIAGNOSIS — Z800 Family history of malignant neoplasm of digestive organs: Secondary | ICD-10-CM | POA: Diagnosis not present

## 2018-10-20 DIAGNOSIS — G8929 Other chronic pain: Secondary | ICD-10-CM

## 2018-10-20 DIAGNOSIS — R0602 Shortness of breath: Secondary | ICD-10-CM | POA: Diagnosis present

## 2018-10-20 DIAGNOSIS — Z79891 Long term (current) use of opiate analgesic: Secondary | ICD-10-CM | POA: Diagnosis not present

## 2018-10-20 DIAGNOSIS — I152 Hypertension secondary to endocrine disorders: Secondary | ICD-10-CM | POA: Diagnosis present

## 2018-10-20 DIAGNOSIS — Z833 Family history of diabetes mellitus: Secondary | ICD-10-CM | POA: Diagnosis not present

## 2018-10-20 DIAGNOSIS — F419 Anxiety disorder, unspecified: Secondary | ICD-10-CM | POA: Diagnosis present

## 2018-10-20 DIAGNOSIS — E739 Lactose intolerance, unspecified: Secondary | ICD-10-CM | POA: Diagnosis present

## 2018-10-20 DIAGNOSIS — Z888 Allergy status to other drugs, medicaments and biological substances status: Secondary | ICD-10-CM | POA: Diagnosis not present

## 2018-10-20 DIAGNOSIS — Z79899 Other long term (current) drug therapy: Secondary | ICD-10-CM | POA: Diagnosis not present

## 2018-10-20 DIAGNOSIS — Z794 Long term (current) use of insulin: Secondary | ICD-10-CM | POA: Diagnosis not present

## 2018-10-20 DIAGNOSIS — M797 Fibromyalgia: Secondary | ICD-10-CM | POA: Diagnosis present

## 2018-10-20 DIAGNOSIS — E10649 Type 1 diabetes mellitus with hypoglycemia without coma: Secondary | ICD-10-CM | POA: Diagnosis not present

## 2018-10-20 DIAGNOSIS — F329 Major depressive disorder, single episode, unspecified: Secondary | ICD-10-CM | POA: Diagnosis present

## 2018-10-20 DIAGNOSIS — K210 Gastro-esophageal reflux disease with esophagitis: Secondary | ICD-10-CM

## 2018-10-20 LAB — POTASSIUM: Potassium: 2.9 mmol/L — ABNORMAL LOW (ref 3.5–5.1)

## 2018-10-20 LAB — GLUCOSE, CAPILLARY
Glucose-Capillary: 101 mg/dL — ABNORMAL HIGH (ref 70–99)
Glucose-Capillary: 115 mg/dL — ABNORMAL HIGH (ref 70–99)
Glucose-Capillary: 125 mg/dL — ABNORMAL HIGH (ref 70–99)
Glucose-Capillary: 224 mg/dL — ABNORMAL HIGH (ref 70–99)
Glucose-Capillary: 263 mg/dL — ABNORMAL HIGH (ref 70–99)
Glucose-Capillary: 59 mg/dL — ABNORMAL LOW (ref 70–99)
Glucose-Capillary: 60 mg/dL — ABNORMAL LOW (ref 70–99)
Glucose-Capillary: 68 mg/dL — ABNORMAL LOW (ref 70–99)
Glucose-Capillary: 77 mg/dL (ref 70–99)

## 2018-10-20 LAB — BASIC METABOLIC PANEL
Anion gap: 14 (ref 5–15)
BUN: 13 mg/dL (ref 6–20)
CO2: 29 mmol/L (ref 22–32)
Calcium: 8.4 mg/dL — ABNORMAL LOW (ref 8.9–10.3)
Chloride: 94 mmol/L — ABNORMAL LOW (ref 98–111)
Creatinine, Ser: 0.69 mg/dL (ref 0.44–1.00)
GFR calc Af Amer: >60 mL/min (ref >60)
GFR calc non Af Amer: >60 mL/min (ref >60)
Glucose, Bld: 70 mg/dL (ref 70–99)
Potassium: 2.9 mmol/L — ABNORMAL LOW (ref 3.5–5.1)
Sodium: 137 mmol/L (ref 135–145)

## 2018-10-20 LAB — TROPONIN I (HIGH SENSITIVITY): Troponin I (High Sensitivity): 6 ng/L (ref <18)

## 2018-10-20 MED ORDER — DEXTROSE 5 % IV SOLN
INTRAVENOUS | Status: DC
  Administered 2018-10-20 – 2018-10-23 (×5): via INTRAVENOUS

## 2018-10-20 MED ORDER — POTASSIUM CHLORIDE 10 MEQ/100ML IV SOLN
10.0000 meq | INTRAVENOUS | Status: AC
  Administered 2018-10-20 (×3): 10 meq via INTRAVENOUS
  Filled 2018-10-20 (×4): qty 100

## 2018-10-20 MED ORDER — LORAZEPAM 2 MG/ML IJ SOLN
0.7500 mg | Freq: Once | INTRAMUSCULAR | Status: AC
  Administered 2018-10-20: 0.75 mg via INTRAVENOUS
  Filled 2018-10-20: qty 1

## 2018-10-20 MED ORDER — HYDROMORPHONE HCL 1 MG/ML IJ SOLN
1.0000 mg | Freq: Once | INTRAMUSCULAR | Status: AC
  Administered 2018-10-20: 1 mg via INTRAVENOUS
  Filled 2018-10-20: qty 1

## 2018-10-20 MED ORDER — INSULIN ASPART 100 UNIT/ML ~~LOC~~ SOLN
0.0000 [IU] | SUBCUTANEOUS | Status: DC
  Administered 2018-10-20: 5 [IU] via SUBCUTANEOUS
  Administered 2018-10-21 (×2): 2 [IU] via SUBCUTANEOUS
  Administered 2018-10-21: 1 [IU] via SUBCUTANEOUS
  Administered 2018-10-22: 2 [IU] via SUBCUTANEOUS
  Administered 2018-10-22: 1 [IU] via SUBCUTANEOUS
  Administered 2018-10-22: 2 [IU] via SUBCUTANEOUS
  Administered 2018-10-22: 1 [IU] via SUBCUTANEOUS

## 2018-10-20 MED ORDER — INSULIN GLARGINE 100 UNIT/ML ~~LOC~~ SOLN
4.0000 [IU] | Freq: Every day | SUBCUTANEOUS | Status: DC
  Administered 2018-10-21 – 2018-10-22 (×2): 4 [IU] via SUBCUTANEOUS
  Filled 2018-10-20 (×3): qty 0.04

## 2018-10-20 MED ORDER — INSULIN ASPART 100 UNIT/ML ~~LOC~~ SOLN
0.0000 [IU] | Freq: Three times a day (TID) | SUBCUTANEOUS | Status: DC
  Filled 2018-10-20: qty 0.09

## 2018-10-20 MED ORDER — METOCLOPRAMIDE HCL 5 MG/ML IJ SOLN
10.0000 mg | Freq: Four times a day (QID) | INTRAMUSCULAR | Status: DC
  Administered 2018-10-20 – 2018-10-22 (×10): 10 mg via INTRAVENOUS
  Filled 2018-10-20 (×10): qty 2

## 2018-10-20 MED ORDER — PANTOPRAZOLE SODIUM 40 MG IV SOLR
40.0000 mg | Freq: Every day | INTRAVENOUS | Status: DC
  Administered 2018-10-20 – 2018-10-23 (×4): 40 mg via INTRAVENOUS
  Filled 2018-10-20 (×4): qty 40

## 2018-10-20 MED ORDER — METOPROLOL TARTRATE 5 MG/5ML IV SOLN
5.0000 mg | Freq: Four times a day (QID) | INTRAVENOUS | Status: DC | PRN
  Administered 2018-10-20 – 2018-10-22 (×3): 5 mg via INTRAVENOUS
  Filled 2018-10-20 (×4): qty 5

## 2018-10-20 MED ORDER — MAGNESIUM SULFATE 2 GM/50ML IV SOLN
2.0000 g | Freq: Once | INTRAVENOUS | Status: AC
  Administered 2018-10-20: 2 g via INTRAVENOUS
  Filled 2018-10-20: qty 50

## 2018-10-20 MED ORDER — DEXTROSE 50 % IV SOLN
1.0000 | Freq: Once | INTRAVENOUS | Status: AC
  Administered 2018-10-20: 50 mL via INTRAVENOUS
  Filled 2018-10-20: qty 50

## 2018-10-20 MED ORDER — METOPROLOL TARTRATE 5 MG/5ML IV SOLN
5.0000 mg | Freq: Once | INTRAVENOUS | Status: AC
  Administered 2018-10-20: 5 mg via INTRAVENOUS
  Filled 2018-10-20: qty 5

## 2018-10-20 MED ORDER — HYDRALAZINE HCL 25 MG PO TABS
25.0000 mg | ORAL_TABLET | Freq: Four times a day (QID) | ORAL | Status: DC
  Administered 2018-10-20 – 2018-10-21 (×3): 25 mg via ORAL
  Filled 2018-10-20 (×3): qty 1

## 2018-10-20 MED ORDER — HYDRALAZINE HCL 20 MG/ML IJ SOLN
10.0000 mg | Freq: Once | INTRAMUSCULAR | Status: AC
  Administered 2018-10-20: 10 mg via INTRAVENOUS
  Filled 2018-10-20: qty 1

## 2018-10-20 MED ORDER — METOPROLOL TARTRATE 25 MG PO TABS
25.0000 mg | ORAL_TABLET | Freq: Two times a day (BID) | ORAL | Status: DC
  Administered 2018-10-20 – 2018-10-23 (×5): 25 mg via ORAL
  Filled 2018-10-20 (×6): qty 1

## 2018-10-20 MED ORDER — POTASSIUM CHLORIDE 10 MEQ/100ML IV SOLN
10.0000 meq | INTRAVENOUS | Status: AC
  Administered 2018-10-20: 18:00:00 10 meq via INTRAVENOUS

## 2018-10-20 MED ORDER — INSULIN GLARGINE 100 UNIT/ML ~~LOC~~ SOLN
8.0000 [IU] | Freq: Every day | SUBCUTANEOUS | Status: DC
  Administered 2018-10-20: 8 [IU] via SUBCUTANEOUS
  Filled 2018-10-20 (×2): qty 0.08

## 2018-10-20 MED ORDER — HYDROMORPHONE HCL 1 MG/ML IJ SOLN
1.0000 mg | INTRAMUSCULAR | Status: DC | PRN
  Administered 2018-10-20 – 2018-10-23 (×18): 1 mg via INTRAVENOUS
  Filled 2018-10-20 (×21): qty 1

## 2018-10-20 MED ORDER — ENSURE ENLIVE PO LIQD: 237.0000 mL | Freq: Two times a day (BID) | ORAL | Status: DC

## 2018-10-20 MED ORDER — ONDANSETRON HCL 4 MG/2ML IJ SOLN
4.0000 mg | Freq: Four times a day (QID) | INTRAMUSCULAR | Status: DC | PRN
  Administered 2018-10-20 – 2018-10-22 (×4): 4 mg via INTRAVENOUS
  Filled 2018-10-20 (×4): qty 2

## 2018-10-20 NOTE — ED Notes (Signed)
Released signed and held orders

## 2018-10-20 NOTE — ED Notes (Signed)
Transporting pt to floor  

## 2018-10-20 NOTE — Progress Notes (Signed)
PROGRESS NOTE  [name] DPO:[id number] DOB: March 28, 1991 DOA: 10/19/2018 PCP: [name], MD  Brief History   [name] is a 28 y.o. female with medical history significant of allergy, anemia, anxiety, right eye cataract, depression, type 2 diabetes, fibromyalgia, gastroparesis, GERD, hypertension who is returning to the emergency department for the second time in the last 3 days due to abdominal pain with persistent nausea and emesis.  She states that she has been having numerous episodes of emesis a day for about 6 to 7 days and was only able to eat some yogurt yesterday.  She has also been having about 10 episodes of loose stools daily.  She mentions that her urine looks very dark, but no dysuria, frequency or hematuria..  She had a low-grade and some night sweats recently, chills, sore throat, productive cough, wheezing or hemoptysis.  No chest pain, palpitations, dizziness diaphoresis.  Denies polyuria, polydipsia, polyphagia or blurred vision.  ED Course: Initial vital signs temperature 99.4 F, pulse 92, respirations 14, blood pressure 96/68 mmHg and O2 sat 97% on room air.  The patient received IV fluids, 1 mg of hydromorphone and 1 mg of lorazepam.  UA was hazy in appearance, with small hemoglobinuria, ketonuria of 5 and proteinuria of 100 mg/dL.  The rest of the results were unremarkable.  CBC had a white count of 4.5, hemoglobin 11.1 g/dL and platelets 232.  Her chest radiograph showed chronic bronchitic changes without consolidation.  [facility] were consulted to admit the patient for further evaluation and treatment.  Consultants  . None  Procedures  . None  Antibiotics   Anti-infectives (From admission, onward)   None    .  Marland Kitchen   Subjective  The patient is sitting up in bed. She states that she has been having loose bowel movements. She states that she is nauseated still, but that she wants to try clear liquids.  Objective   Vitals:  Vitals:    10/20/18 1628 10/20/18 1730  BP: (!) 179/109 (!) 179/112  Pulse: 92   Resp: 16   Temp: 98.6 F (37 C)   SpO2: 99%     Exam:  Constitutional:  . The patient is awake, alert, and oriented x 3. No acute distress. Respiratory:  . CTA bilaterally, no w/r/r.  . Respiratory effort normal. No retractions or accessory muscle use Cardiovascular:  . RRR, no m/r/g . No LE extremity edema   . Normal pedal pulses Abdomen:  . Abdomen is distended and diffusely tender.  . No hernias, masses, or organomegaly is appreciated. Marland Kitchen Hypoactive bowel sounds.  Musculoskeletal:  . No cyanosis, clubbing, or edema. Skin:  . No rashes, lesions, ulcers . palpation of skin: no induration or nodules Neurologic:  . CN 2-12 intact . Sensation all 4 extremities intact   I have personally reviewed the following:   Today's Data  . Glucoses, BMP and vitals.  Scheduled Meds: . enoxaparin (LOVENOX) injection  40 mg Subcutaneous Q24H  . feeding supplement (ENSURE ENLIVE)  237 mL Oral BID BM  . hydrALAZINE  25 mg Oral Q6H  . insulin aspart  0-9 Units Subcutaneous Q4H  . insulin glargine  4 Units Subcutaneous QHS  . metoCLOPramide (REGLAN) injection  10 mg Intravenous Q6H  . metoprolol tartrate  25 mg Oral BID  . pantoprazole (PROTONIX) IV  40 mg Intravenous Daily   Continuous Infusions: . dextrose 75 mL/hr at 10/20/18 1425    Active Problems:   Chronic abdominal pain  Hypertension associated with diabetes (HCC)   Diabetes mellitus type 1 (HCC)   Hypokalemia   GERD (gastroesophageal reflux disease)   Normocytic anemia   Diabetic gastroparesis associated with type 1 diabetes mellitus (HCC)   Diabetic gastroparesis (HCC)   LOS: 0 days   A & P  Diabetic gastroparesis  associated with type 1 diabetes mellitus [facility]): Per patient improved. She want to try clear liquids. Continue to monitor electrolytes and blood sugars closely. I have discussed with the patient the adverse effects of the use of  narcotics for pain control with her gastroparesis. She states that she understands and will try to reduce use. She is receiving protonix daily, antiemetics as needed, IV dilaudid every 4 hours as needed, and anxiolytics.   Chronic abdominal pain: Noted. Analgesics as needed. Antiemetics as needed.  Hypertension associated with diabetes [facility]): Oral antihypertensives have been restarted. Monitor.  Hypoglycemia: Resolved with 1 amd of D 50 and starting D5 at 50 cc/hr. Monitor.   Diabetes mellitus type 1 (HCC): Reduce Lantus to 4 units at bedtime due to hypoglycemia.  CBG monitoring with regular insulin sliding scale.  Hypokalemia: Supplement and monitor.  GERD (gastroesophageal reflux disease): Protonix 40 mg IVPB every 24 hours. Switch to oral form once tolerating diet.  Normocytic anemia: Monitor H&H.  I have seen and examined this patient myself. I have spent 35 minute in her evaluation and care.  DVT prophylaxis: SCDs. Code Status: Full code. Family Communication:None available. Disposition Plan: Observation for IV hydration and electrolyte replacement.  Catha Ontko, DO [facility] Direct contact: see [email or web address]  7PM-7AM contact night coverage as above 10/20/2018, 9:02 PM  LOS: 0 days

## 2018-10-20 NOTE — Progress Notes (Signed)
Hypoglycemic Event  CBG:  68 Treatment: 4 oz juice/soda 4 oz reg soda  Symptoms: None none  Follow-up CBG: Time:1255 CBG Result:60  Possible Reasons for Event: Inadequate meal intake limited po intake  Comments/MD notified: [place], [place]

## 2018-10-20 NOTE — ED Notes (Signed)
ED TO INPATIENT HANDOFF REPORT  ED Nurse Name and Phone #: [name] Name/Age/Gender [name] 28 y.o. female Room/Bed: WA22/WA22  Code Status   Code Status: Full Code  Home/SNF/Other Home Patient oriented to: AOx4 Is this baseline? Yes   Triage Complete: Triage complete  Chief Complaint Abdominal Pain; Emesis  Triage Note Patient c/o chest tightness, abdominal pain, and emesis x 5-6 days ago. Patient was seen for the same 2 days ago.  CBG-153.   Allergies Allergies  Allergen Reactions  . Other Anaphylaxis    Reaction to [place] nuts   . Lactose Intolerance (Gi) Diarrhea    Level of Care/Admitting Diagnosis ED Disposition    ED Disposition Condition Comment   Admit  Hospital Area: [facility] [[id number]]  Level of Care: Telemetry [5]  Admit to tele based on following criteria: Other see comments  Comments: Hypokalemia  Covid Evaluation: Confirmed COVID Negative  Diagnosis: Diabetic gastroparesis [facility]) [[id number]]  Admitting Physician: Bobette Mo [[id number]]  Attending Physician: Bobette Mo [[id number]]  PT Class (Do Not Modify): Observation [104]  PT Acc Code (Do Not Modify): Observation [10022]       B Medical/Surgery History Past Medical History:  Diagnosis Date  . Allergy   . Anemia   . Anxiety   . Blood transfusion without reported diagnosis    Dec 2018  . Cataract    right eye  . Depression   . Diabetes type 1, uncontrolled (HCC) 11/14/2011   Since age 60  . Fibromyalgia   . Gastroparesis   . GERD (gastroesophageal reflux disease)   . Hypertension   . Infection    UTI April 2016   Past Surgical History:  Procedure Laterality Date  . ANKLE SURGERY    . CHOLECYSTECTOMY  11/15/2011   Procedure: LAPAROSCOPIC CHOLECYSTECTOMY WITH INTRAOPERATIVE CHOLANGIOGRAM;  Surgeon: [name], MD;  Location: [facility];  Service: General;  Laterality: N/A;  . COLONOSCOPY    . COLONOSCOPY WITH PROPOFOL N/A 06/27/2017   Procedure: COLONOSCOPY WITH PROPOFOL;  Surgeon: Rachael Fee, MD;  Location: [facility];  Service: Endoscopy;  Laterality: N/A;  . ESOPHAGOGASTRODUODENOSCOPY  12/03/2011   Procedure: ESOPHAGOGASTRODUODENOSCOPY (EGD);  Surgeon: Theda Belfast, MD;  Location: [facility];  Service: Endoscopy;  Laterality: N/A;  . FLEXIBLE SIGMOIDOSCOPY N/A 03/10/2017   Procedure: FLEXIBLE SIGMOIDOSCOPY;  Surgeon: Jeani Hawking, MD;  Location: [facility];  Service: Endoscopy;  Laterality: N/A;  . INCISION AND DRAINAGE PERIRECTAL ABSCESS N/A 03/01/2017   Procedure: IRRIGATION AND DEBRIDEMENT PERIRECTAL ABSCESS;  Surgeon: Ovidio Kin, MD;  Location: [facility];  Service: General;  Laterality: N/A;  . IRRIGATION AND DEBRIDEMENT BUTTOCKS N/A 03/23/2017   Procedure: IRRIGATION AND DEBRIDEMENT BUTTOCKS, SETON PLACEMENT;  Surgeon: Romie Levee, MD;  Location: [facility];  Service: General;  Laterality: N/A;  . LAPAROSCOPY  11/23/2011   Procedure: LAPAROSCOPY DIAGNOSTIC;  Surgeon: Mariella Saa, MD;  Location: [facility];  Service: General;  Laterality: N/A;  . SIGMOIDOSCOPY    . UPPER GASTROINTESTINAL ENDOSCOPY    . WISDOM TOOTH EXTRACTION       A IV Location/Drains/Wounds Patient Lines/Drains/Airways Status   Active Line/Drains/Airways    Name:   Placement date:   Placement time:   Site:   Days:   Peripheral IV 10/19/18 Right Arm   10/19/18    2218    Arm   1   Midline Single Lumen 10/19/18 Midline Left Basilic 8 cm 0 cm   10/19/18  2313    Basilic   1          Intake/Output Last 24 hours  Intake/Output Summary (Last 24 hours) at 10/20/2018 0400 Last data filed at 10/20/2018 0202 Gross per 24 hour  Intake 2036 ml  Output -  Net 2036 ml    Labs/Imaging Results for orders placed or performed during the hospital encounter of 10/19/18 (from the past 48 hour(s))  CBG monitoring, ED     Status: Abnormal   Collection Time: 10/19/18  4:51 PM  Result Value Ref Range   Glucose-Capillary 153 (H) 70 - 99  mg/dL  Lipase, blood     Status: None   Collection Time: 10/19/18  5:38 PM  Result Value Ref Range   Lipase 24 11 - 51 U/L    Comment: Performed at [facility], [address] [place], [place] [address]  Comprehensive metabolic panel     Status: Abnormal   Collection Time: 10/19/18  5:38 PM  Result Value Ref Range   Sodium 138 135 - 145 mmol/L   Potassium 2.7 (LL) 3.5 - 5.1 mmol/L    Comment: CRITICAL RESULT CALLED TO, READ BACK BY AND VERIFIED WITH: S.BINGHAM AT 1826 ON 10/19/18 BY N.THOMPSON    Chloride 91 (L) 98 - 111 mmol/L   CO2 35 (H) 22 - 32 mmol/L   Glucose, Bld 142 (H) 70 - 99 mg/dL   BUN 16 6 - 20 mg/dL   Creatinine, Ser 6.041.28 (H) 0.44 - 1.00 mg/dL   Calcium 8.9 8.9 - 54.010.3 mg/dL   Total Protein 7.0 6.5 - 8.1 g/dL   Albumin 3.5 3.5 - 5.0 g/dL   AST 23 15 - 41 U/L   ALT 31 0 - 44 U/L   Alkaline Phosphatase 76 38 - 126 U/L   Total Bilirubin 0.8 0.3 - 1.2 mg/dL   GFR calc non Af Amer 56 (L) >60 mL/min   GFR calc Af Amer >60 >60 mL/min   Anion gap 12 5 - 15    Comment: Performed at [facility], [address] [place] [address] [place], [place] [address]  CBC     Status: Abnormal   Collection Time: 10/19/18  5:38 PM  Result Value Ref Range   WBC 4.5 4.0 - 10.5 K/uL   RBC 4.01 3.87 - 5.11 MIL/uL   Hemoglobin 11.1 (L) 12.0 - 15.0 g/dL   HCT 14.734.2 (L) 82.936.0 - 56.246.0 %   MCV 85.3 80.0 - 100.0 fL   MCH 27.7 26.0 - 34.0 pg   MCHC 32.5 30.0 - 36.0 g/dL   RDW 13.013.0 86.511.5 - 78.415.5 %   Platelets 232 150 - 400 K/uL   nRBC 0.0 0.0 - 0.2 %    Comment: Performed at [facility], [address] [place], [place] [address]  Troponin I (High Sensitivity)     Status: None   Collection Time: 10/19/18  5:38 PM  Result Value Ref Range   Troponin I (High Sensitivity) 7 <18 ng/L    Comment: (NOTE) Elevated high sensitivity troponin I (hsTnI) values and significant  changes across serial measurements may suggest ACS but many other  chronic and acute  conditions are known to elevate hsTnI results.  Refer to the Links section for chest pain algorithms and additional  guidance. Performed at [facility], [address] [place], [place] [address]   I-Stat beta hCG blood, ED     Status: None   Collection Time: 10/19/18  5:45 PM  Result Value Ref Range   I-stat hCG, quantitative <5.0 <5 mIU/mL   Comment 3            Comment:   GEST. AGE      CONC.  (mIU/mL)   <=1 WEEK        5 - 50     2 WEEKS       50 - 500     3 WEEKS       100 - 10,000     4 WEEKS     1,000 - 30,000        FEMALE AND NON-PREGNANT FEMALE:     LESS THAN 5 mIU/mL   Troponin I (High Sensitivity)     Status: None   Collection Time: 10/19/18  6:57 PM  Result Value Ref Range   Troponin I (High Sensitivity) 6.0 <18 ng/L    Comment: (NOTE) Elevated high sensitivity troponin I (hsTnI) values and significant  changes across serial measurements may suggest ACS but many other  chronic and acute conditions are known to elevate hsTnI results.  Refer to the "Links" section for chest pain algorithms and additional  guidance. Performed at [facility], [place] [address], [place], [place] [address]   SARS Coronavirus 2 (CEPHEID - Performed in [place] hospital lab), Hosp Order     Status: None   Collection Time: 10/19/18  7:26 PM   Specimen: Nasopharyngeal Swab  Result Value Ref Range   SARS Coronavirus 2 NEGATIVE NEGATIVE    Comment: (NOTE) If result is NEGATIVE SARS-CoV-2 target nucleic acids are NOT DETECTED. The SARS-CoV-2 RNA is generally detectable in upper and lower  respiratory specimens during the acute phase of infection. The lowest  concentration of SARS-CoV-2 viral copies this assay can detect is 250  copies / mL. A negative result does not preclude SARS-CoV-2 infection  and should not be used as the sole basis for treatment or other  patient management decisions.  A negative result may occur with  improper specimen  collection / handling, submission of specimen other  than nasopharyngeal swab, presence of viral mutation(s) within the  areas targeted by this assay, and inadequate number of viral copies  (<250 copies / mL). A negative result must be combined with clinical  observations, patient history, and epidemiological information. If result is POSITIVE SARS-CoV-2 target nucleic acids are DETECTED. The SARS-CoV-2 RNA is generally detectable in upper and lower  respiratory specimens dur ing the acute phase of infection.  Positive  results are indicative of active infection with SARS-CoV-2.  Clinical  correlation with patient history and other diagnostic information is  necessary to determine patient infection status.  Positive results do  not rule out bacterial infection or co-infection with other viruses. If result is PRESUMPTIVE POSTIVE SARS-CoV-2 nucleic acids MAY BE PRESENT.   A presumptive positive result was obtained on the submitted specimen  and confirmed on repeat testing.  While 2019 novel coronavirus  (SARS-CoV-2) nucleic acids may be present in the submitted sample  additional confirmatory testing may be necessary for epidemiological  and / or clinical management purposes  to differentiate between  SARS-CoV-2 and other Sarbecovirus currently known to infect humans.  If clinically indicated additional testing with an alternate test  methodology [phone]) is advised. The SARS-CoV-2 RNA is generally  detectable in upper and lower respiratory sp ecimens during the acute  phase of infection. The expected result is Negative. Fact Sheet for Patients:  StrictlyIdeas.no Fact Sheet  for Healthcare Providers: https://pope.com/ This test is not yet approved or cleared by the [place] and has been authorized for detection and/or diagnosis of SARS-CoV-2 by FDA under an Emergency Use Authorization (EUA).  This EUA will remain in effect  (meaning this test can be used) for the duration of the COVID-19 declaration under Section 564(b)(1) of the Act, 21 U.S.C. section 360bbb-3(b)(1), unless the authorization is terminated or revoked sooner. Performed at [facility], [address], [place], [place] [address]   Urinalysis, Routine w reflex microscopic     Status: Abnormal   Collection Time: 10/19/18  7:46 PM  Result Value Ref Range   Color, Urine YELLOW YELLOW   APPearance HAZY (A) CLEAR   Specific Gravity, Urine 1.010 1.005 - 1.030   pH 6.0 5.0 - 8.0   Glucose, UA NEGATIVE NEGATIVE mg/dL   Hgb urine dipstick SMALL (A) NEGATIVE   Bilirubin Urine NEGATIVE NEGATIVE   Ketones, ur 5 (A) NEGATIVE mg/dL   Protein, ur 947 (A) NEGATIVE mg/dL   Nitrite NEGATIVE NEGATIVE   Leukocytes,Ua NEGATIVE NEGATIVE   RBC / HPF 6-10 0 - 5 RBC/hpf   WBC, UA 6-10 0 - 5 WBC/hpf   Bacteria, UA NONE SEEN NONE SEEN   Squamous Epithelial / LPF 0-5 0 - 5   Mucus PRESENT    Hyaline Casts, UA PRESENT     Comment: Performed at [facility], [address] [place], [place] [address]  Magnesium     Status: None   Collection Time: 10/19/18 10:00 PM  Result Value Ref Range   Magnesium 1.9 1.7 - 2.4 mg/dL    Comment: Performed at [facility], [address], [place], [place] [address]  Phosphorus     Status: None   Collection Time: 10/19/18 10:00 PM  Result Value Ref Range   Phosphorus 4.4 2.5 - 4.6 mg/dL    Comment: Performed at [facility], [address], [place], [place] [address]  Glucose, capillary     Status: Abnormal   Collection Time: 10/20/18  3:38 AM  Result Value Ref Range   Glucose-Capillary 101 (H) 70 - 99 mg/dL   Dg Chest 2 View  Result Date: 10/19/2018 CLINICAL DATA:  Chest pain, history hypertension, diabetes mellitus EXAM: CHEST - 2 VIEW COMPARISON:  09/18/2018 FINDINGS: EKG leads project over the upper lobes bilaterally. Normal heart size,  mediastinal contours, and pulmonary vascularity. Mild chronic bronchitic changes. Lungs otherwise clear. No pleural effusion or pneumothorax. Bones unremarkable. IMPRESSION: Chronic bronchitic changes without infiltrate. Electronically Signed   By: Ulyses Southward M.D.   On: 10/19/2018 17:57    Pending Labs Unresulted Labs (From admission, onward)    Start     Ordered   10/26/18 0500  Creatinine, serum  (enoxaparin (LOVENOX)    CrCl >/= 30 ml/min)  Weekly,   R    Comments: while on enoxaparin therapy    10/19/18 2159          Vitals/Pain Today's Vitals   10/20/18 0230 10/20/18 0300 10/20/18 0335 10/20/18 0341  BP: (!) 150/98 (!) 185/115 (!) 186/117   Pulse:  87 91   Resp: 12 (!) 8 16   Temp:      TempSrc:      SpO2:  98% 100%   Weight:      Height:      PainSc:    7     Isolation Precautions No active isolations  Medications Medications  0.9 % NaCl with KCl 40 mEq / L  infusion (250 mL/hr Intravenous New Bag/Given 10/19/18 2347)  enoxaparin (LOVENOX) injection 40 mg (40 mg Subcutaneous Not Given 10/19/18 2300)  insulin glargine (LANTUS) injection 8 Units (8 Units Subcutaneous Given 10/20/18 0341)  magnesium sulfate IVPB 2 g 50 mL (2 g Intravenous New Bag/Given 10/20/18 0321)  sodium chloride flush (NS) 0.9 % injection 10-40 mL (has no administration in time range)  HYDROmorphone (DILAUDID) injection 1 mg (has no administration in time range)  metoCLOPramide (REGLAN) injection 10 mg (10 mg Intravenous Given 10/20/18 0049)  pantoprazole (PROTONIX) injection 40 mg (40 mg Intravenous Given 10/20/18 0315)  metoprolol tartrate (LOPRESSOR) injection 5 mg (has no administration in time range)  insulin aspart (novoLOG) injection 0-9 Units (has no administration in time range)  sodium chloride flush (NS) 0.9 % injection 3 mL (3 mLs Intravenous Given 10/19/18 2229)  HYDROmorphone (DILAUDID) injection 1 mg (1 mg Intravenous Given 10/19/18 2227)  promethazine (PHENERGAN) injection 25 mg (25 mg  Intravenous Given 10/19/18 2226)  LORazepam (ATIVAN) injection 1 mg (1 mg Intravenous Given 10/19/18 2223)  sodium chloride 0.9 % bolus 1,000 mL (0 mLs Intravenous Stopped 10/19/18 2339)  potassium chloride 10 mEq in 100 mL IVPB (0 mEq Intravenous Stopped 10/19/18 2344)  magnesium sulfate IVPB 2 g 50 mL (0 g Intravenous Stopped 10/20/18 0202)  HYDROmorphone (DILAUDID) injection 1 mg (1 mg Intravenous Given 10/20/18 0104)  LORazepam (ATIVAN) injection 0.75 mg (0.75 mg Intravenous Given 10/20/18 0050)  metoprolol tartrate (LOPRESSOR) injection 5 mg (5 mg Intravenous Given 10/20/18 0101)    Mobility walks Low fall risk   Focused Assessments NA   R Recommendations: See Admitting Provider Note  Report given to:   Additional Notes: NA

## 2018-10-20 NOTE — ED Notes (Signed)
Time to call report 03:49

## 2018-10-20 NOTE — Progress Notes (Signed)
Hypoglycemic Event  CBG: 60  Treatment: 4 oz juice/soda  Symptoms: None  Follow-up CBG: Time:1335 CBG Result:59  Possible Reasons for Event: Inadequate meal intake  Comments/MD notified:Swayze, needs orders given     Hulda Humphrey, [employer]

## 2018-10-20 NOTE — Progress Notes (Signed)
Assumed care of the pt from 1500:1900. Pt with continued hypertension. Called placed to MD. Ordered received and initiated. Repeat Bp 148/105. Not within parameters for Lopressor. Cont with  Plan of care

## 2018-10-20 NOTE — Progress Notes (Signed)
Pt BP 163/112 at 1155, 5 mg of Lopressor IV given. BP recheck at 1342 BP 196/111. Swayze MD notified and new orders given.

## 2018-10-21 ENCOUNTER — Inpatient Hospital Stay (HOSPITAL_COMMUNITY): Payer: 59

## 2018-10-21 ENCOUNTER — Other Ambulatory Visit: Payer: Self-pay | Admitting: Gastroenterology

## 2018-10-21 LAB — BASIC METABOLIC PANEL
Anion gap: 8 (ref 5–15)
BUN: 9 mg/dL (ref 6–20)
CO2: 30 mmol/L (ref 22–32)
Calcium: 7.8 mg/dL — ABNORMAL LOW (ref 8.9–10.3)
Chloride: 92 mmol/L — ABNORMAL LOW (ref 98–111)
Creatinine, Ser: 0.62 mg/dL (ref 0.44–1.00)
GFR calc Af Amer: >60 mL/min (ref >60)
GFR calc non Af Amer: >60 mL/min (ref >60)
Glucose, Bld: 140 mg/dL — ABNORMAL HIGH (ref 70–99)
Potassium: 2.5 mmol/L — CL (ref 3.5–5.1)
Sodium: 130 mmol/L — ABNORMAL LOW (ref 135–145)

## 2018-10-21 LAB — CBC WITH DIFFERENTIAL/PLATELET
Abs Immature Granulocytes: 0.01 10*3/uL (ref 0.00–0.07)
Basophils Absolute: 0 10*3/uL (ref 0.0–0.1)
Basophils Relative: 0 %
Eosinophils Absolute: 0.1 10*3/uL (ref 0.0–0.5)
Eosinophils Relative: 2 %
HCT: 28.5 % — ABNORMAL LOW (ref 36.0–46.0)
Hemoglobin: 9.4 g/dL — ABNORMAL LOW (ref 12.0–15.0)
Immature Granulocytes: 0 %
Lymphocytes Relative: 39 %
Lymphs Abs: 2.3 10*3/uL (ref 0.7–4.0)
MCH: 27.8 pg (ref 26.0–34.0)
MCHC: 33 g/dL (ref 30.0–36.0)
MCV: 84.3 fL (ref 80.0–100.0)
Monocytes Absolute: 0.4 10*3/uL (ref 0.1–1.0)
Monocytes Relative: 7 %
Neutro Abs: 2.9 10*3/uL (ref 1.7–7.7)
Neutrophils Relative %: 52 %
Platelets: 200 10*3/uL (ref 150–400)
RBC: 3.38 MIL/uL — ABNORMAL LOW (ref 3.87–5.11)
RDW: 12.9 % (ref 11.5–15.5)
WBC: 5.8 10*3/uL (ref 4.0–10.5)
nRBC: 0 % (ref 0.0–0.2)

## 2018-10-21 LAB — GLUCOSE, CAPILLARY
Glucose-Capillary: 139 mg/dL — ABNORMAL HIGH (ref 70–99)
Glucose-Capillary: 151 mg/dL — ABNORMAL HIGH (ref 70–99)
Glucose-Capillary: 149 mg/dL — ABNORMAL HIGH (ref 70–99)
Glucose-Capillary: 178 mg/dL — ABNORMAL HIGH (ref 70–99)
Glucose-Capillary: 165 mg/dL — ABNORMAL HIGH (ref 70–99)

## 2018-10-21 LAB — POTASSIUM: Potassium: 3.2 mmol/L — ABNORMAL LOW (ref 3.5–5.1)

## 2018-10-21 IMAGING — CT CT ABDOMEN AND PELVIS WITH CONTRAST
2 of 4 series · 15 of 46 positions shown, 17 images · IV contrast (omnipaque)
Comparison: CT [DATE], additional prior exams reviewed.

CLINICAL DATA: Worsening abdominal pain. Nausea and vomiting.
History of gastroparesis.

EXAM:
CT ABDOMEN AND PELVIS WITH CONTRAST
TECHNIQUE: Multidetector CT imaging of the abdomen and pelvis was performed
using the standard protocol following bolus administration of
intravenous contrast.
CONTRAST:  80mL OMNIPAQUE IOHEXOL 300 MG/ML  SOLN

[Series 2: axial st · axial · 0.69mm/px · z∈[-564,-164]mm · 12 of 92 slices shown, 14 images]
[im 6/92  soft-tissue]
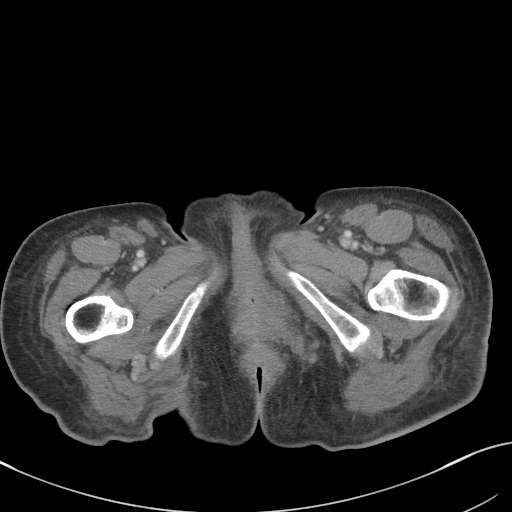
[im 6/92  bone]
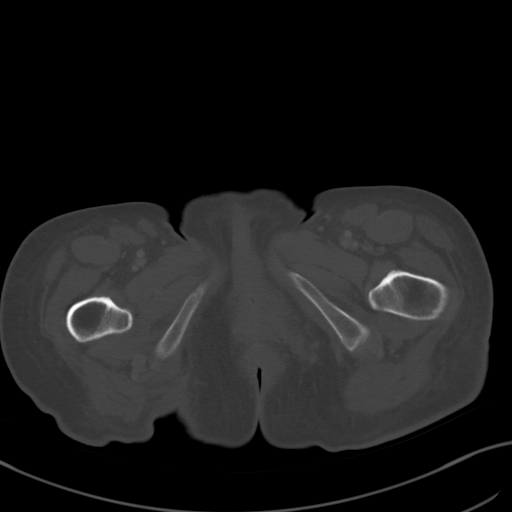
[im 17/92  soft-tissue]
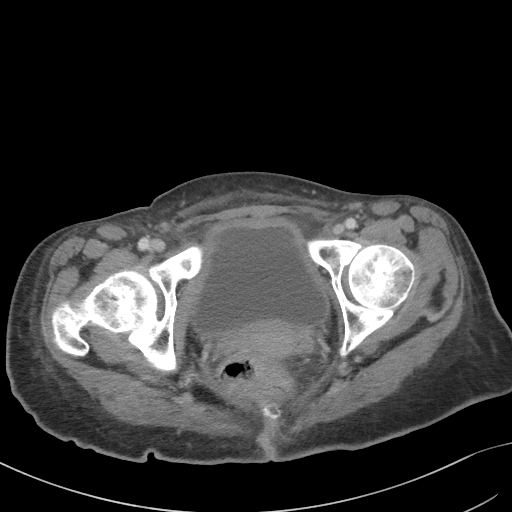
[im 22/92  soft-tissue]
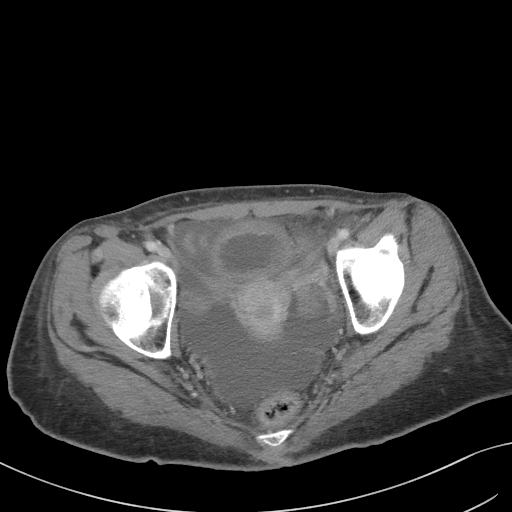
[im 27/92  soft-tissue]
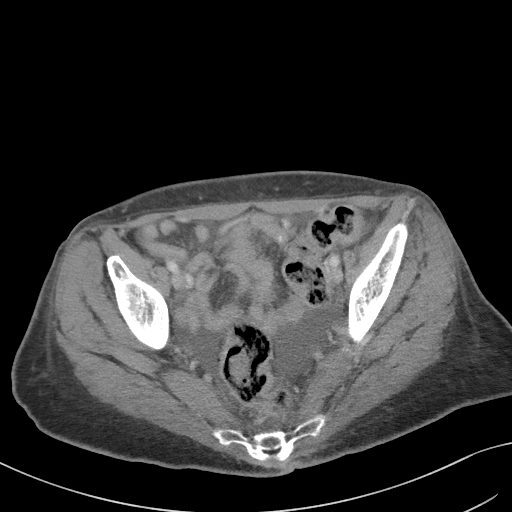
[im 38/92  soft-tissue]
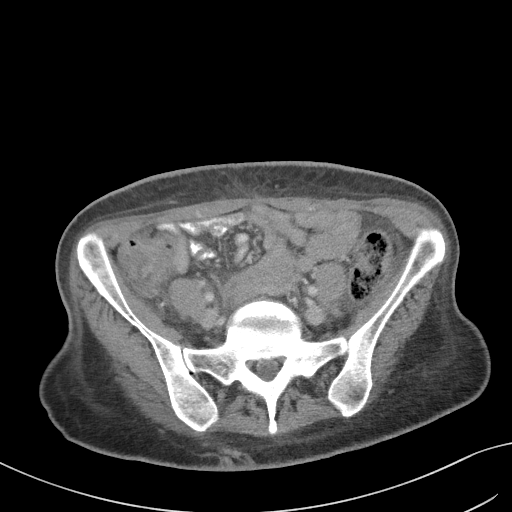
[im 43/92  soft-tissue]
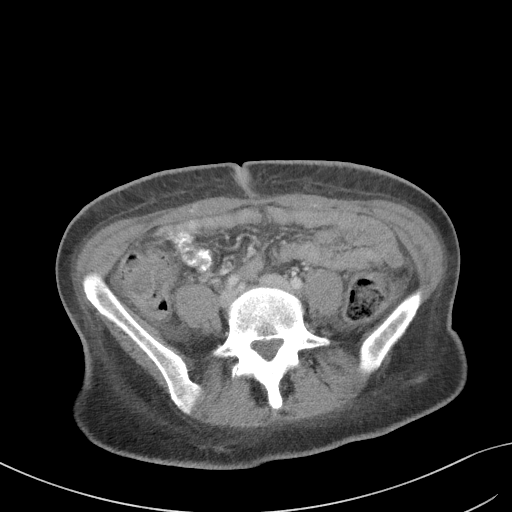
[im 49/92  soft-tissue]
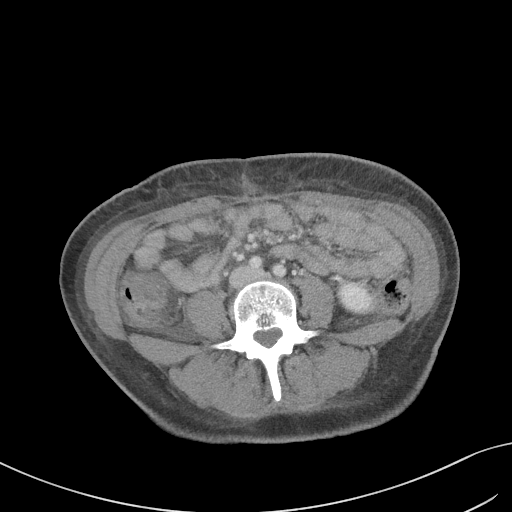
[im 59/92  soft-tissue]
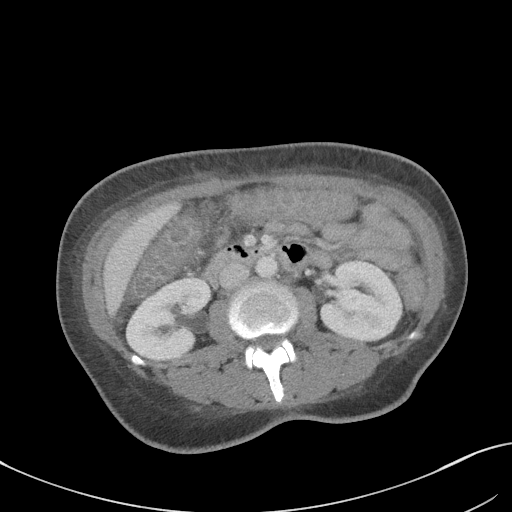
[im 65/92  soft-tissue]
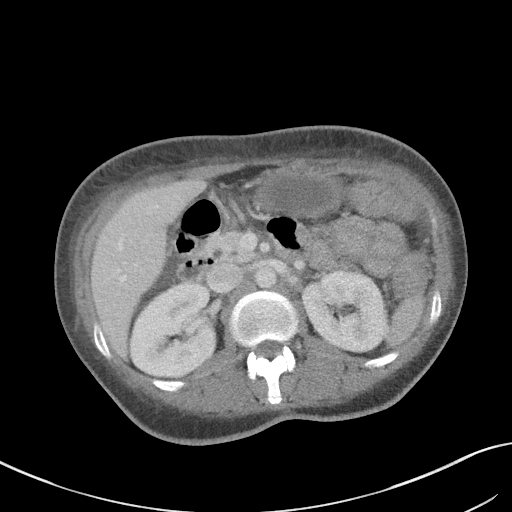
[im 65/92  bone]
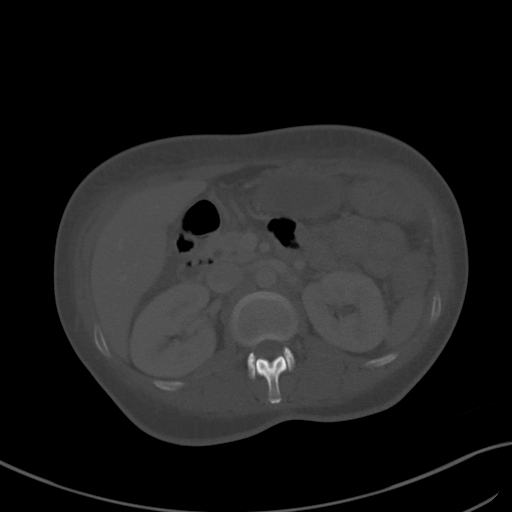
[im 70/92  soft-tissue]
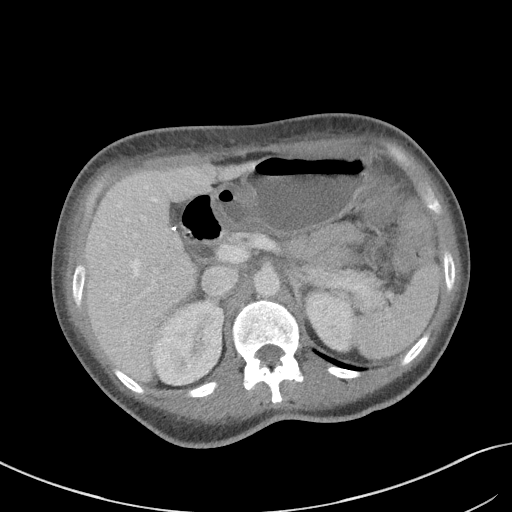
[im 81/92  soft-tissue]
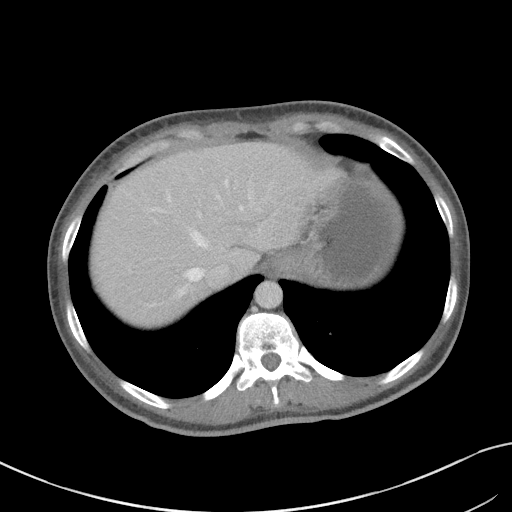
[im 86/92  soft-tissue]
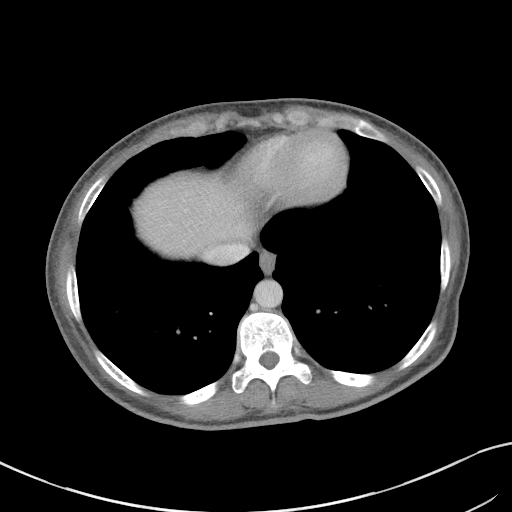

[Series 5: coronal st · coronal · 0.64mm/px · 3 of 87 slices shown]
[im 29/87  soft-tissue]
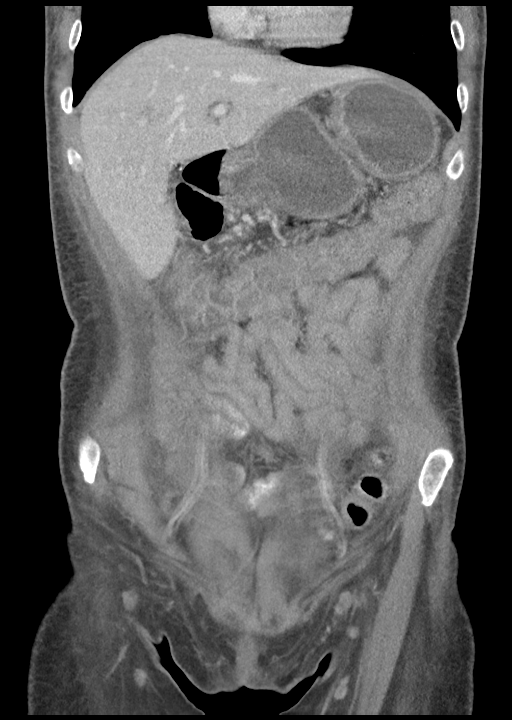
[im 39/87  soft-tissue]
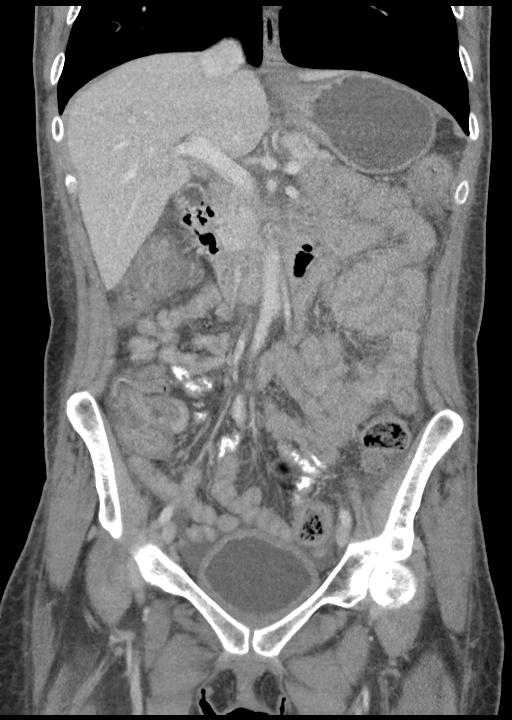
[im 48/87  soft-tissue]
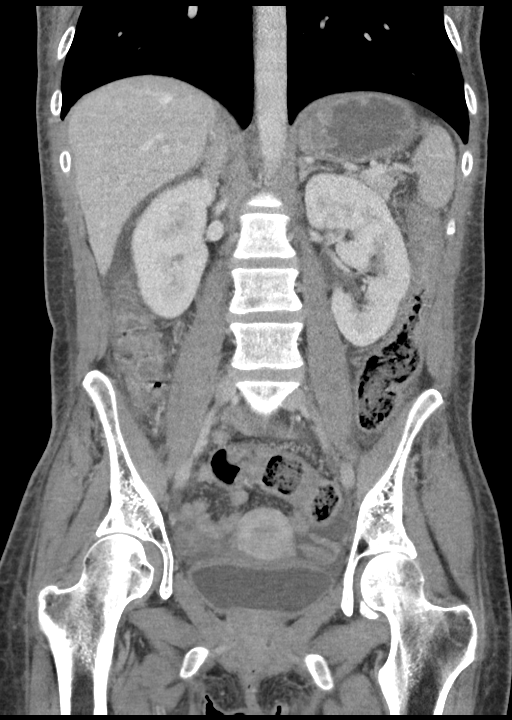

[15 of 46 positions shown; findings below may reference images not displayed]

FINDINGS: Lower chest: Lung bases are clear. No consolidation or pleural
fluid.

Hepatobiliary: Diminished hepatic steatosis from prior exam. Focal
fatty infiltration adjacent with falciform ligament as before. Clips
in the gallbladder fossa postcholecystectomy. No biliary dilatation.

Pancreas: Mild parenchymal atrophy. No ductal dilatation or
inflammation.

Spleen: No splenic injury or perisplenic hematoma.

Adrenals/Urinary Tract: Normal adrenal glands. No hydronephrosis or
perinephric edema. Homogeneous renal enhancement. Urinary bladder is
partially distended, mild bladder wall thickening.

Stomach/Bowel: Stomach distended, similar to prior exam. No
significant small bowel inflammation. No bowel obstruction. Small
amount of enteric contrast is in distal small bowel. Low-density
wall thickening of the ascending, transverse, and proximal
descending colon. Associated pericolonic and mesenteric edema. Small
volume of stool in the more distal colon. Appendix tentatively but
not definitively identified. No evidence of appendicitis.

Vascular/Lymphatic: Normal caliber abdominal aorta. The portal vein
is patent. No bulky abdominopelvic adenopathy.

Reproductive: Uterus and bilateral adnexa are unremarkable.

Other: Moderate free fluid in the pelvis that appears simple. Mild
generalized mesenteric and body wall edema. No free air. No
intra-abdominal abscess.

Musculoskeletal: There are no acute or suspicious osseous
abnormalities.
IMPRESSION: 1. Colonic wall thickening of the ascending, transverse, and
proximal descending colon with pericolonic and mesenteric edema.
Findings are suspicious for colitis, similar findings seen on prior
exams.
2. Moderate free fluid in the pelvis, likely reactive. Generalized
body wall edema.
3. Mild bladder wall thickening.
4. Gastric distension consistent with gastroparesis.

## 2018-10-21 MED ORDER — DICYCLOMINE HCL 10 MG/ML IM SOLN
20.0000 mg | Freq: Four times a day (QID) | INTRAMUSCULAR | Status: DC
  Filled 2018-10-21 (×3): qty 2

## 2018-10-21 MED ORDER — LORAZEPAM 2 MG/ML IJ SOLN
1.0000 mg | Freq: Once | INTRAMUSCULAR | Status: AC
  Filled 2018-10-21: qty 1

## 2018-10-21 MED ORDER — POTASSIUM CHLORIDE 10 MEQ/100ML IV SOLN
10.0000 meq | INTRAVENOUS | Status: AC
  Administered 2018-10-21: 10 meq via INTRAVENOUS
  Filled 2018-10-21: qty 100

## 2018-10-21 MED ORDER — IOHEXOL 300 MG/ML  SOLN: 30.0000 mL | Freq: Once | INTRAMUSCULAR | Status: DC | PRN

## 2018-10-21 MED ORDER — IOHEXOL 300 MG/ML  SOLN
100.0000 mL | Freq: Once | INTRAMUSCULAR | Status: AC | PRN
  Administered 2018-10-21: 80 mL via INTRAVENOUS

## 2018-10-21 MED ORDER — DICYCLOMINE HCL 20 MG PO TABS
20.0000 mg | ORAL_TABLET | Freq: Three times a day (TID) | ORAL | Status: DC
  Administered 2018-10-21 – 2018-10-23 (×7): 20 mg via ORAL
  Filled 2018-10-21 (×9): qty 1

## 2018-10-21 MED ORDER — HYDRALAZINE HCL 50 MG PO TABS
50.0000 mg | ORAL_TABLET | Freq: Four times a day (QID) | ORAL | Status: DC
  Administered 2018-10-21 – 2018-10-23 (×6): 50 mg via ORAL
  Filled 2018-10-21 (×6): qty 1

## 2018-10-21 MED ORDER — LORAZEPAM 2 MG/ML IJ SOLN
1.0000 mg | Freq: Once | INTRAMUSCULAR | Status: DC
  Filled 2018-10-21: qty 1

## 2018-10-21 NOTE — Discharge Instructions (Signed)
Gastroparesis diet The following suggestions can help minimize symptoms: 1. Drink enough fluids to prevent dehydration. Dehydration can increase symptoms of nausea. Sip liquids steadily throughout the day; dont gulp. For most adults, fluid needs are 6-10 cups or 1500-2400 ml per day. Liquids can pass through the stomach more easily and quickly than solids. Liquid nutritional supplements such as Ensure or Boost may help you achieve adequate calories and protein. 2. Eat small, frequent meals. Many people find that frequent small meals (5-6 or more per day) produce fewer symptoms than large meals. 3. Eat nutritious foods first before filling up on snacks or empty calories. Some people find they tolerate solids better earlier in the day. Start with solids earlier in day and finish with light or liquid meal in the evening. 4. Reduce fat intake. Fat naturally slows stomach emptying. Consuming foods labeled low fat, nonfat, or fat-free may help with symptoms. Avoid all high fat, fried or greasy foods. Liquid fat in beverages, however, is often tolerated and is encouraged if you are experiencing unintentional weight loss. Liquid fat = fats and oils that remain liquid even when refrigerated, such as vegetable oils, cream, non-dairy cream, half-and-half. 5. Reduce fiber intake. Fiber slows stomach emptying. High-fiber foods should be avoided because they may remain in your stomach or may cause bezoar formation. A bezoar is a mixture of food fibers that may cause a blockage in your stomach and prevent it from being able to empty well. A bezoar is similar to a hairball in a cat. Refer to the table below on foods to avoid. 6. Chew foods well. Chew all food to a mashed potato or pudding consistency. Solid foods such as meat may be tolerated if ground or pureed. If you need to puree your food, many foods can be liquefied in a blender or food processor, but solid foods will need to be cut in pieces  and thinned with some type of liquid. Here are some suggestions:  Meats, fish, poultry: Blend with broths, water, milk, vegetables or vegetable juice, tomato sauce, gravies. Continued  Starches (potatoes, pasta): Blend with water, tomato juice, broths, or strained baby vegetables.  Fruits: Blend with their own juices, other fruit juices, or strained baby fruits.  Mixed dishes (such as lasagna, macaroni and cheese, spaghetti, chili, chop suey): add adequate liquid of your choice, blend well, and strain if necessary.  If you do not have a blender, strained baby foods will work and can be thinned down as needed with milk, soy milk, rice milk, broth, etc.  Always clean your blender after each meal: take apart, unscrew blender base and blade, and wash, as food will accumulate.  At meals, take pureed foods and liquid supplements before coffee, tea, or carbonated beverages. 7. Sit up while eating and for at least 1 hour after finishing your meal; dont lay down. 8. If you have diabetes, keep your blood sugar under control. Call your doctor with any questions or concerns and work with your registered dietitian to follow a carbohydrate budget for the day. Keeping your blood sugars in goal ranges (before and after meals) may decrease gastroparesis problems. High blood sugars directly interfere with normal stomach emptying. 9. Alcohol should be avoided, since it can also impair gastric emptying. 10. Exercise has been shown to increase stomach emptying in healthy individuals and might improve symptoms. Walking after meals is recommended.  -From [facility]

## 2018-10-21 NOTE — Progress Notes (Signed)
PROGRESS NOTE  TRULA FREDE RXV:[id number] DOB: Nov 18, 1990 DOA: 10/19/2018 PCP: [name], MD  Brief History   [name] is a 28 y.o. female with medical history significant of allergy, anemia, anxiety, right eye cataract, depression, type 2 diabetes, fibromyalgia, gastroparesis, GERD, hypertension who is returning to the emergency department for the second time in the last 3 days due to abdominal pain with persistent nausea and emesis.  She states that she has been having numerous episodes of emesis a day for about 6 to 7 days and was only able to eat some yogurt yesterday.  She has also been having about 10 episodes of loose stools daily.  She mentions that her urine looks very dark, but no dysuria, frequency or hematuria..  She had a low-grade and some night sweats recently, chills, sore throat, productive cough, wheezing or hemoptysis.  No chest pain, palpitations, dizziness diaphoresis.  Denies polyuria, polydipsia, polyphagia or blurred vision.  ED Course: Initial vital signs temperature 99.4 F, pulse 92, respirations 14, blood pressure 96/68 mmHg and O2 sat 97% on room air.  The patient received IV fluids, 1 mg of hydromorphone and 1 mg of lorazepam.  UA was hazy in appearance, with small hemoglobinuria, ketonuria of 5 and proteinuria of 100 mg/dL.  The rest of the results were unremarkable.  CBC had a white count of 4.5, hemoglobin 11.1 g/dL and platelets 232.  Her chest radiograph showed chronic bronchitic changes without consolidation.  [facility] were consulted to admit the patient for further evaluation and treatment.  Consultants  . None  Procedures  . None  Antibiotics   Anti-infectives (From admission, onward)   None     .   Subjective  The patient is lying on her side in bed as I enter the room. She quickly becomes tearful as she tells me that her abdominal pain is worse. She states that she has vomited multiple times today.  Objective   Vitals:   Vitals:   10/21/18 1412 10/21/18 1413  BP: (!) 187/173 (!) 156/108  Pulse: (!) 101 (!) 101  Resp: 20   Temp: 98.3 F (36.8 C)   SpO2: 96% 100%    Exam:  Constitutional:  . The patient is awake, alert, and oriented x 3. Moderate distress from abdominal pain. Respiratory:  . There is no increased work of breathing. . There is no wheezes, rales, or rhonchi. . No tactile fremitus. Cardiovascular:  . Regular rate and rhythm. . No murmurs, ectopy, or gallups are appreciated. . No lateral PMI. No thrills. Abdomen:  . Abdomen is distended and diffusely tender.  . No hernias, masses, or organomegaly is appreciated. Marland Kitchen Hypoactive bowel sounds.  Musculoskeletal:  . No cyanosis, clubbing, or edema. Skin:  . No rashes, lesions, ulcers . palpation of skin: no induration or nodules Neurologic:  . CN 2-12 intact . Sensation all 4 extremities intact  I have personally reviewed the following:   Today's Data  . Glucoses, BMP and vitals.  Scheduled Meds: . dicyclomine  20 mg Oral TID AC & HS  . enoxaparin (LOVENOX) injection  40 mg Subcutaneous Q24H  . feeding supplement (ENSURE ENLIVE)  237 mL Oral BID BM  . hydrALAZINE  50 mg Oral Q6H  . insulin aspart  0-9 Units Subcutaneous Q4H  . insulin glargine  4 Units Subcutaneous QHS  . metoCLOPramide (REGLAN) injection  10 mg Intravenous Q6H  . metoprolol tartrate  25 mg Oral BID  . pantoprazole (PROTONIX) IV  40  mg Intravenous Daily   Continuous Infusions: . dextrose 75 mL/hr at 10/21/18 0408    Active Problems:   Chronic abdominal pain   Hypertension associated with diabetes (HCC)   Diabetes mellitus type 1 (HCC)   Hypokalemia   GERD (gastroesophageal reflux disease)   Normocytic anemia   Diabetic gastroparesis associated with type 1 diabetes mellitus (HCC)   Diabetic gastroparesis (HCC)   LOS: 1 day   A & P  Diabetic gastroparesis  associated with type 1 diabetes mellitus [facility]): Per patient improved. She want to try clear  liquids. Continue to monitor electrolytes and blood sugars closely. I have discussed with the patient the adverse effects of the use of narcotics for pain control with her gastroparesis. She states that she understands and will try to reduce use. She is receiving protonix daily, antiemetics as needed, IV dilaudid every 4 hours as needed, and anxiolytics. I have added Bentyl as needed for GI cramping. I have also ordered a CT abdomen due to worsening pain.  Chronic abdominal pain: Worsening. Analgesics as needed. Antiemetics as needed.  Hypertension associated with diabetes [facility]): Oral antihypertensives have been restarted. Monitor.  Hypoglycemia: Resolved with 1 amd of D 50 and starting D5 at 50 cc/hr. Monitor.   Diabetes mellitus type 1 (HCC): Reduce Lantus to 4 units at bedtime due to hypoglycemia.  CBG monitoring with regular insulin sliding scale.  Hypokalemia: Supplement and monitor.  GERD (gastroesophageal reflux disease): Protonix 40 mg IVPB every 24 hours. Switch to oral form once tolerating diet.  Normocytic anemia: Monitor H&H.  I have seen and examined this patient myself. I have spent 32 minute in her evaluation and care.  DVT prophylaxis: SCDs. Code Status: Full code. Family Communication:None available. Disposition Plan: Observation for IV hydration and electrolyte replacement.  Praveen Coia, DO [facility] Direct contact: see [email or web address]  7PM-7AM contact night coverage as above 10/21/2018, 5:26 PM  LOS: 0 days

## 2018-10-21 NOTE — Progress Notes (Signed)
Initial Nutrition Assessment  INTERVENTION:   -Continue Ensure Enlive po BID, each supplement provides 350 kcal and 20 grams of protein -Provided Gastroparesis diet information in discharge instructions  NUTRITION DIAGNOSIS:   Inadequate oral intake related to nausea, vomiting, diarrhea as evidenced by per patient/family report.  GOAL:   Patient will meet greater than or equal to 90% of their needs  MONITOR:   PO intake, Supplement acceptance, Weight trends, Labs, I & O's  REASON FOR ASSESSMENT:   Malnutrition Screening Tool    ASSESSMENT:   28 y.o. female with medical history significant of allergy, anemia, anxiety, right eye cataract, depression, type 2 diabetes, fibromyalgia, gastroparesis, GERD, hypertension who is returning to the emergency department for the second time in the last 3 days due to abdominal pain with persistent nausea and emesis.  **RD working remotely**  Patient with history of admissions d/t gastroparesis. Last assessed by nutrition team in May 2020. Per chart review, pt has been having N/V and diarrhea for ~3 days PTA. Pt now on clear liquids but is still having some nausea and did have vomiting yesterday. Ensure supplements have been ordered but not given since on clears. Will continue order when pt is able to tolerate.   Per weight records, pt's weight tends to fluctuate between 124-142 lbs.   Medications: IV Reglan every 6 hours, D5 solution, IV KCl, IV Zofran PRN  Labs reviewed: CBGs: 139-151 Low Na, K   NUTRITION - FOCUSED PHYSICAL EXAM:  Unable to perform -working remotely.  Diet Order:   Diet Order            Diet clear liquid Room service appropriate? Yes; Fluid consistency: Thin  Diet effective now              EDUCATION NEEDS:   No education needs have been identified at this time  Skin:  Skin Assessment: Reviewed RN Assessment  Last BM:  7/22  Height:   Ht Readings from Last 1 Encounters:  10/19/18 5\' 7"  (1.702 m)     Weight:   Wt Readings from Last 1 Encounters:  10/19/18 59 kg    Ideal Body Weight:  61.3 kg  BMI:  Body mass index is 20.36 kg/m.  Estimated Nutritional Needs:   Kcal:  1500-1700  Protein:  70-80g  Fluid:  1.7L/day  Clayton Bibles, MS, RD, LDN [place] Dietitian Pager: [phone] After Hours Pager: [phone]

## 2018-10-21 NOTE — Progress Notes (Signed)
CRITICAL VALUE ALERT  Critical Value:  K+ - 2.5  Date & Time Notied:  10/21/2018 @ 6:45am  Provider Notified: Lamar Blinks   Orders Received/Actions taken: pending

## 2018-10-22 ENCOUNTER — Inpatient Hospital Stay (HOSPITAL_COMMUNITY): Payer: 59

## 2018-10-22 LAB — CBC WITH DIFFERENTIAL/PLATELET
Abs Immature Granulocytes: 0.01 10*3/uL (ref 0.00–0.07)
Basophils Absolute: 0 10*3/uL (ref 0.0–0.1)
Basophils Relative: 0 %
Eosinophils Absolute: 0.2 10*3/uL (ref 0.0–0.5)
Eosinophils Relative: 3 %
HCT: 27.5 % — ABNORMAL LOW (ref 36.0–46.0)
Hemoglobin: 9.2 g/dL — ABNORMAL LOW (ref 12.0–15.0)
Immature Granulocytes: 0 %
Lymphocytes Relative: 55 %
Lymphs Abs: 2.6 10*3/uL (ref 0.7–4.0)
MCH: 28 pg (ref 26.0–34.0)
MCHC: 33.5 g/dL (ref 30.0–36.0)
MCV: 83.8 fL (ref 80.0–100.0)
Monocytes Absolute: 0.3 10*3/uL (ref 0.1–1.0)
Monocytes Relative: 6 %
Neutro Abs: 1.7 10*3/uL (ref 1.7–7.7)
Neutrophils Relative %: 36 %
Platelets: 190 10*3/uL (ref 150–400)
RBC: 3.28 MIL/uL — ABNORMAL LOW (ref 3.87–5.11)
RDW: 13.2 % (ref 11.5–15.5)
WBC: 4.8 10*3/uL (ref 4.0–10.5)
nRBC: 0 % (ref 0.0–0.2)

## 2018-10-22 LAB — COMPREHENSIVE METABOLIC PANEL
ALT: 19 U/L (ref 0–44)
AST: 16 U/L (ref 15–41)
Albumin: 2.8 g/dL — ABNORMAL LOW (ref 3.5–5.0)
Alkaline Phosphatase: 64 U/L (ref 38–126)
Anion gap: 9 (ref 5–15)
BUN: 6 mg/dL (ref 6–20)
CO2: 29 mmol/L (ref 22–32)
Calcium: 7.9 mg/dL — ABNORMAL LOW (ref 8.9–10.3)
Chloride: 92 mmol/L — ABNORMAL LOW (ref 98–111)
Creatinine, Ser: 0.53 mg/dL (ref 0.44–1.00)
GFR calc Af Amer: >60 mL/min (ref >60)
GFR calc non Af Amer: >60 mL/min (ref >60)
Glucose, Bld: 153 mg/dL — ABNORMAL HIGH (ref 70–99)
Potassium: 2.9 mmol/L — ABNORMAL LOW (ref 3.5–5.1)
Sodium: 130 mmol/L — ABNORMAL LOW (ref 135–145)
Total Bilirubin: 0.5 mg/dL (ref 0.3–1.2)
Total Protein: 5.5 g/dL — ABNORMAL LOW (ref 6.5–8.1)

## 2018-10-22 LAB — GLUCOSE, CAPILLARY
Glucose-Capillary: 147 mg/dL — ABNORMAL HIGH (ref 70–99)
Glucose-Capillary: 158 mg/dL — ABNORMAL HIGH (ref 70–99)
Glucose-Capillary: 147 mg/dL — ABNORMAL HIGH (ref 70–99)
Glucose-Capillary: 128 mg/dL — ABNORMAL HIGH (ref 70–99)
Glucose-Capillary: 157 mg/dL — ABNORMAL HIGH (ref 70–99)
Glucose-Capillary: 171 mg/dL — ABNORMAL HIGH (ref 70–99)
Glucose-Capillary: 193 mg/dL — ABNORMAL HIGH (ref 70–99)

## 2018-10-22 IMAGING — DX ABDOMEN - 1 VIEW
2 series · 2 of 2 positions shown · non-contrast
Comparison: Body CT [DATE]

CLINICAL DATA: Constipation, abdominal pain.

EXAM:
ABDOMEN - 1 VIEW

[abdomen kub (1 of 2)]
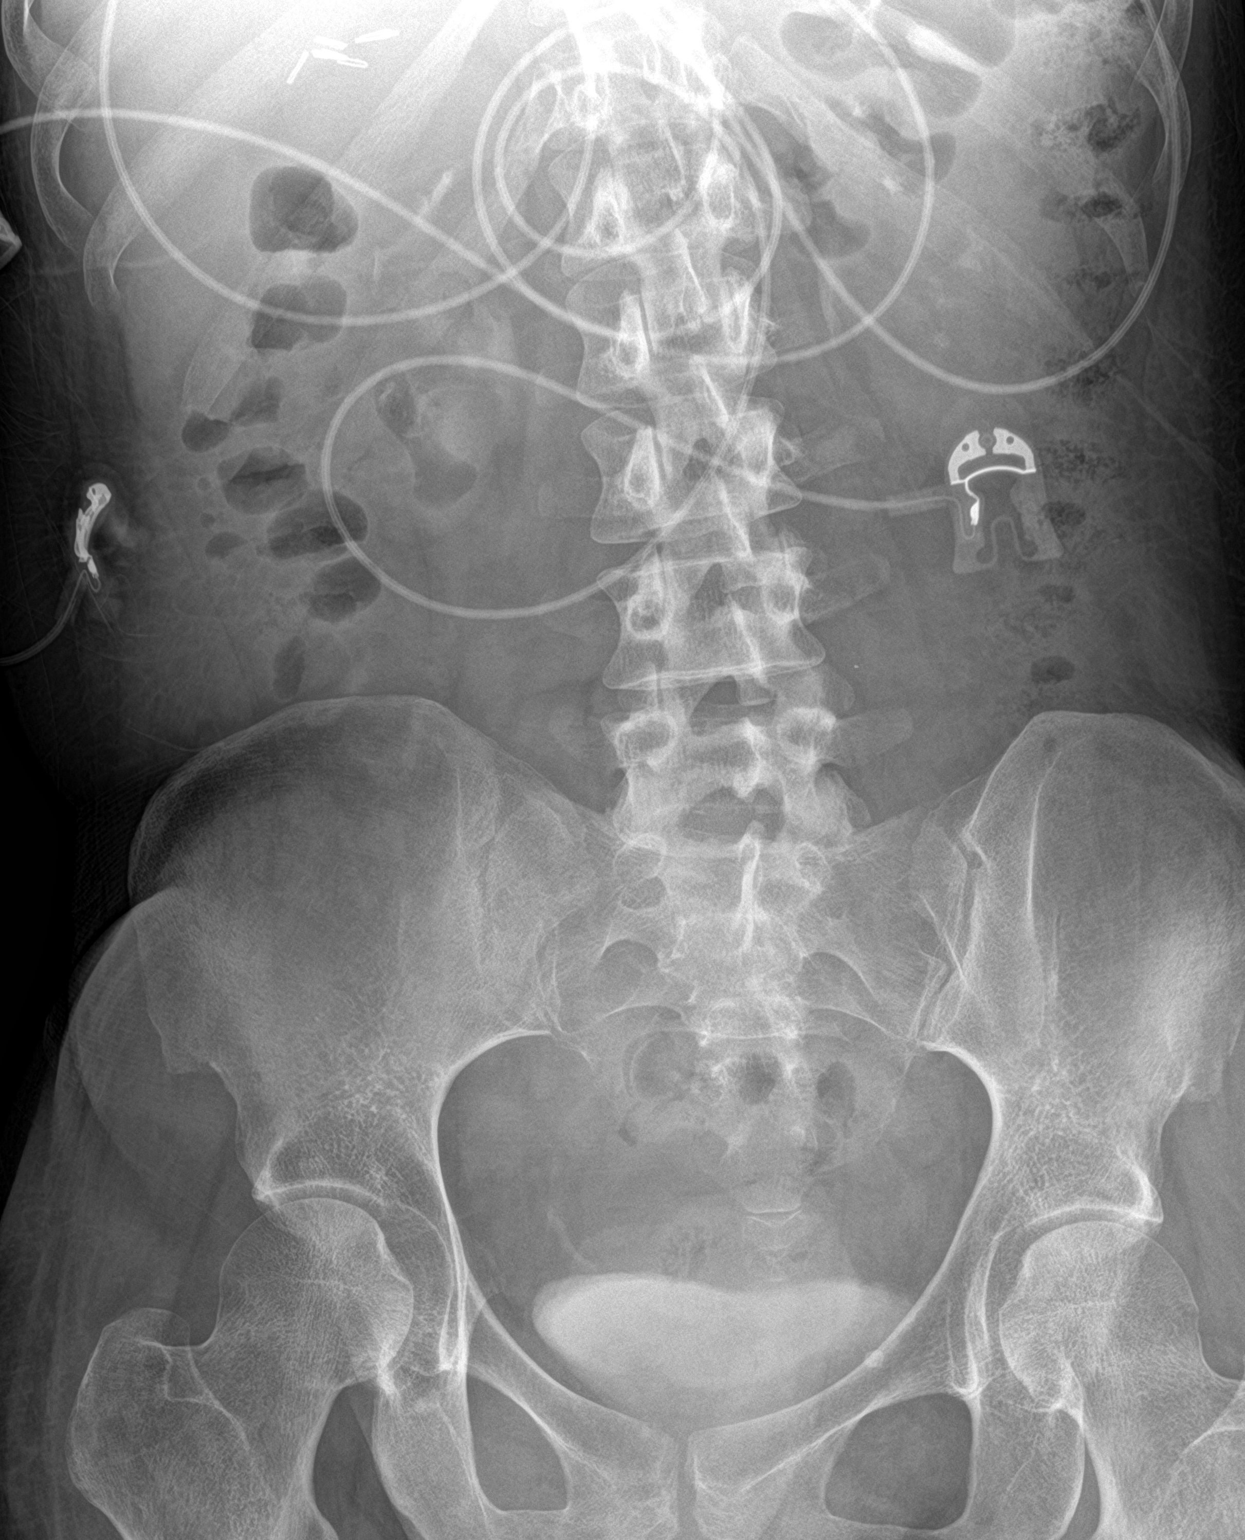

[abdomen kub (2 of 2)]
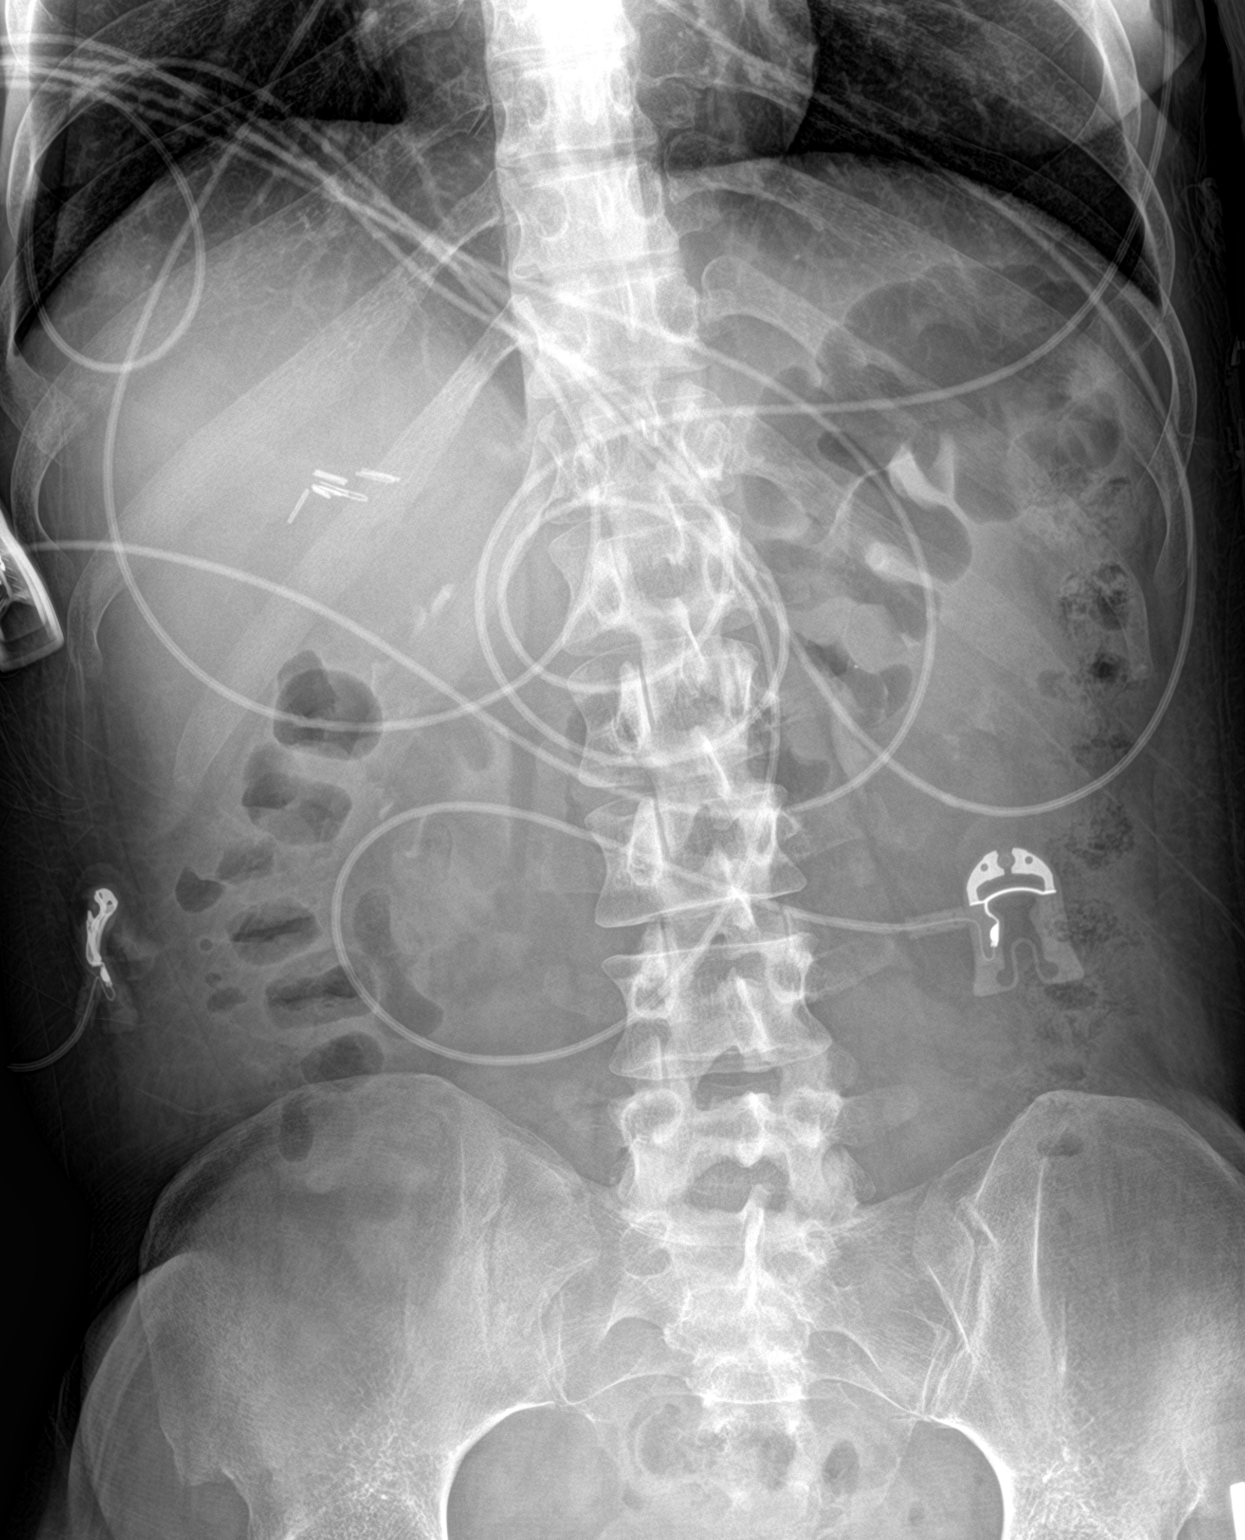

[2 of 2 positions shown; findings below may reference images not displayed]

FINDINGS: The bowel gas pattern is normal. Moderate amount of formed stool
throughout the colon.

Residual excreted contrast within the collecting systems of the
kidneys and urinary bladder.
IMPRESSION: 1. Nonobstructive bowel gas pattern.
2. Constipation.

## 2018-10-22 MED ORDER — POTASSIUM CHLORIDE 10 MEQ/100ML IV SOLN
10.0000 meq | INTRAVENOUS | Status: AC
  Administered 2018-10-22 (×4): 10 meq via INTRAVENOUS
  Filled 2018-10-22: qty 100

## 2018-10-22 MED ORDER — METOCLOPRAMIDE HCL 5 MG/ML IJ SOLN
20.0000 mg | Freq: Four times a day (QID) | INTRAVENOUS | Status: DC
  Administered 2018-10-22 – 2018-10-23 (×5): 20 mg via INTRAVENOUS
  Filled 2018-10-22 (×6): qty 4

## 2018-10-22 MED ORDER — LORAZEPAM 1 MG PO TABS
1.0000 mg | ORAL_TABLET | Freq: Every day | ORAL | Status: DC
  Administered 2018-10-22: 1 mg via ORAL
  Filled 2018-10-22: qty 1

## 2018-10-22 NOTE — Consult Note (Signed)
   [facility] CM Inpatient Consult   04/08/3233  [place] [birth year] [phone]  Patient screened for potential [facility] Care Management services due to unplanned readmission risk score of 42%, extreme, 30 day readmission, and multiple hospitalizations.   Spoke with Ms. Vasek by telephone. HIPAA verified. Explained [facility] Care Management (CM) services and her eligibility under [facility] insurance plan. Endorses that primary physician is Dr. Jerline Pain. Explained that this a benefit of her insurance plan with no additional cost incurred and that it is a voluntary program; she may consent and withdraw at any time. Inquired about community needs for medication assistance, transportation, and assistance with disease management. Patient states that she could not speak for a long time right now but does consent to receive community follow up with a [facility] care manager.   Will continue to follow for progression and disposition plans. Will place [facility] CM referral for community follow up if patient transitions to home.  [facility] Care Management services does not replace or interfere with any services that are arranged by inpatient case management or social work.  Netta Cedars, MSN, [facility] Liaison Nurse Mobile Phone [phone]  Toll free office [phone]

## 2018-10-22 NOTE — Progress Notes (Signed)
PROGRESS NOTE  [name] QQI:[id number] DOB: July 13, 1990 DOA: 10/19/2018 PCP: Ardith Dark, MD  Brief History   [name] is a 28 y.o. female with medical history significant of allergy, anemia, anxiety, right eye cataract, depression, type 2 diabetes, fibromyalgia, gastroparesis, GERD, hypertension who is returning to the emergency department for the second time in the last 3 days due to abdominal pain with persistent nausea and emesis.  She states that she has been having numerous episodes of emesis a day for about 6 to 7 days and was only able to eat some yogurt yesterday.  She has also been having about 10 episodes of loose stools daily.  She mentions that her urine looks very dark, but no dysuria, frequency or hematuria..  She had a low-grade and some night sweats recently, chills, sore throat, productive cough, wheezing or hemoptysis.  No chest pain, palpitations, dizziness diaphoresis.  Denies polyuria, polydipsia, polyphagia or blurred vision.  ED Course: Initial vital signs temperature 99.4 F, pulse 92, respirations 14, blood pressure 96/68 mmHg and O2 sat 97% on room air.  The patient received IV fluids, 1 mg of hydromorphone and 1 mg of lorazepam.  UA was hazy in appearance, with small hemoglobinuria, ketonuria of 5 and proteinuria of 100 mg/dL.  The rest of the results were unremarkable.  CBC had a white count of 4.5, hemoglobin 11.1 g/dL and platelets 941.  Her chest radiograph showed chronic bronchitic changes without consolidation.  [facility] were consulted to admit the patient for further evaluation and treatment. Repeat CT abdomen was performed due to the patient's failure to progress on 10/21/2018. It demonstrated only a distended stomach and the same mild thickening of the colon noted on previous scans.  Consultants  . None  Procedures  . None  Antibiotics   Anti-infectives (From admission, onward)   None     .   Subjective  The patient is sitting  up at bedside today. She is in much better spirits. She states that she is feeling better and wants to advance her diet to full liquids.   Objective   Vitals:  Vitals:   10/22/18 0551 10/22/18 1300  BP: 119/83 99/66  Pulse: (!) 114 82  Resp: 18 17  Temp: 98.5 F (36.9 C) 98.7 F (37.1 C)  SpO2: 100% 100%    Exam:  Constitutional:  The patient is awake, alert, and oriented x 3. No acute distress. Respiratory:  . There is no increased work of breathing. . There is no wheezes, rales, or rhonchi. . No tactile fremitus. Cardiovascular:  . Regular rate and rhythm. . No murmurs, ectopy, or gallups are appreciated. . No lateral PMI. No thrills. Abdomen:  . Abdomen is less distended today, but remains diffusely tender.  . No hernias, masses, or organomegaly is appreciated. Marland Kitchen Hypoactive bowel sounds.  Musculoskeletal:  . No cyanosis, clubbing, or edema. Skin:  . No rashes, lesions, ulcers . palpation of skin: no induration or nodules Neurologic:  . CN 2-12 intact . Sensation all 4 extremities intact  I have personally reviewed the following:   Today's Data  . Glucoses, BMP and vitals.  Scheduled Meds: . dicyclomine  20 mg Oral TID AC & HS  . enoxaparin (LOVENOX) injection  40 mg Subcutaneous Q24H  . feeding supplement (ENSURE ENLIVE)  237 mL Oral BID BM  . hydrALAZINE  50 mg Oral Q6H  . insulin aspart  0-9 Units Subcutaneous Q4H  . insulin glargine  4 Units Subcutaneous QHS  .  LORazepam  1 mg Oral Daily  . metoprolol tartrate  25 mg Oral BID  . pantoprazole (PROTONIX) IV  40 mg Intravenous Daily   Continuous Infusions: . dextrose 75 mL/hr at 10/22/18 0121  . metoCLOPramide (REGLAN) injection      Active Problems:   Chronic abdominal pain   Hypertension associated with diabetes ([place])   Diabetes mellitus type 1 (HCC)   Hypokalemia   GERD (gastroesophageal reflux disease)   Normocytic anemia   Diabetic gastroparesis associated with type 1 diabetes mellitus  ([place])   Diabetic gastroparesis (HCC)   LOS: 2 days   A & P  Diabetic gastroparesis  associated with type 1 diabetes mellitus [facility]): Per patient improved. She want to try clear liquids. Continue to monitor electrolytes and blood sugars closely. I have discussed with the patient the adverse effects of the use of narcotics for pain control with her gastroparesis. She states that she understands and will try to reduce use. She is receiving protonix daily, antiemetics as needed, IV dilaudid every 4 hours as needed, and anxiolytics. I have added Bentyl as needed for GI cramping. Repeat CT abdomen was performed due to the patient's failure to progress on 10/21/2018. It demonstrated only a distended stomach and the same mild thickening of the colon noted on previous scans. She is feeling better today. Reglan has been increased to 20 mg qid. At her request the patient's diet has been advanced to full liquids.   Chronic abdominal pain: Patient states that it is better. Analgesics as needed. Antiemetics as needed.  Hypertension associated with diabetes [facility]): Oral antihypertensives have been restarted. Monitor.  Hypoglycemia: Resolved with 1 amp of D 50 and starting D5 at 50 cc/hr. Monitor.   Diabetes mellitus type 1 ([place]): Reduce Lantus to 4 units at bedtime due to hypoglycemia.  CBG monitoring with regular insulin sliding scale.  Hypokalemia: Supplement and monitor.  GERD (gastroesophageal reflux disease): Protonix 40 mg IVPB every 24 hours. Switch to oral form once tolerating diet.  Normocytic anemia: Monitor H&H.  I have seen and examined this patient myself. I have spent 34 minute in her evaluation and care.  DVT prophylaxis: SCDs. Code Status: Full code. Family Communication:None available. Disposition Plan: Observation for IV hydration and electrolyte replacement.  Chelle Cayton, DO [facility] Direct contact: see [email or web address]  7PM-7AM contact night coverage as above  10/22/2018, 6:03 PM  LOS: 0 days

## 2018-10-23 ENCOUNTER — Other Ambulatory Visit: Payer: Self-pay | Admitting: Gastroenterology

## 2018-10-23 ENCOUNTER — Other Ambulatory Visit: Payer: Self-pay | Admitting: Family Medicine

## 2018-10-23 LAB — COMPREHENSIVE METABOLIC PANEL
ALT: 20 U/L (ref 0–44)
AST: 18 U/L (ref 15–41)
Albumin: 3 g/dL — ABNORMAL LOW (ref 3.5–5.0)
Alkaline Phosphatase: 64 U/L (ref 38–126)
Anion gap: 5 (ref 5–15)
BUN: 5 mg/dL — ABNORMAL LOW (ref 6–20)
CO2: 30 mmol/L (ref 22–32)
Calcium: 8.3 mg/dL — ABNORMAL LOW (ref 8.9–10.3)
Chloride: 98 mmol/L (ref 98–111)
Creatinine, Ser: 0.63 mg/dL (ref 0.44–1.00)
GFR calc Af Amer: >60 mL/min (ref >60)
GFR calc non Af Amer: >60 mL/min (ref >60)
Glucose, Bld: 167 mg/dL — ABNORMAL HIGH (ref 70–99)
Potassium: 4 mmol/L (ref 3.5–5.1)
Sodium: 133 mmol/L — ABNORMAL LOW (ref 135–145)
Total Bilirubin: 0.4 mg/dL (ref 0.3–1.2)
Total Protein: 5.9 g/dL — ABNORMAL LOW (ref 6.5–8.1)

## 2018-10-23 LAB — GLUCOSE, CAPILLARY
Glucose-Capillary: 87 mg/dL (ref 70–99)
Glucose-Capillary: 68 mg/dL — ABNORMAL LOW (ref 70–99)
Glucose-Capillary: 77 mg/dL (ref 70–99)
Glucose-Capillary: 117 mg/dL — ABNORMAL HIGH (ref 70–99)
Glucose-Capillary: 84 mg/dL (ref 70–99)

## 2018-10-23 MED ORDER — HYDRALAZINE HCL 50 MG PO TABS: 50.0000 mg | ORAL_TABLET | Freq: Four times a day (QID) | ORAL | 0 refills | Status: DC

## 2018-10-23 MED ORDER — LORAZEPAM 0.5 MG PO TABS: 0.5000 mg | ORAL_TABLET | Freq: Two times a day (BID) | ORAL | Status: DC

## 2018-10-23 MED ORDER — METOPROLOL TARTRATE 25 MG PO TABS: 25.0000 mg | ORAL_TABLET | Freq: Two times a day (BID) | ORAL | 0 refills | Status: DC

## 2018-10-23 MED ORDER — LORAZEPAM 0.5 MG PO TABS
0.5000 mg | ORAL_TABLET | Freq: Two times a day (BID) | ORAL | Status: DC
  Administered 2018-10-23 (×2): 0.5 mg via ORAL
  Filled 2018-10-23 (×2): qty 1

## 2018-10-23 NOTE — Discharge Summary (Signed)
Physician Discharge Summary  [name] RJJ:[id number] DOB: 02-17-1991 DOA: 10/19/2018  PCP: [name], MD  Admit date: 10/19/2018 Discharge date: 10/23/2018  Recommendations for Outpatient Follow-up:  Follow up with PCP in 7-10 days.  Discharge Diagnoses: Principal diagnosis is #1 1. Intractable nausea and vomiting. 2. Diabetic gastroparesis 3. DM II 4. Anxiety, Depression 5. GERD 6. Hypertension.  Discharge Condition: Fair Disposition: Home  Diet recommendation: Carbohydrate modified.  Filed Weights   10/19/18 1641 10/22/18 0551  Weight: 59 kg 53.5 kg    History of present illness:  [name] is a 28 y.o. female with medical history significant of allergy, anemia, anxiety, right eye cataract, depression, type 2 diabetes, fibromyalgia, gastroparesis, GERD, hypertension who is returning to the emergency department for the second time in the last 3 days due to abdominal pain with persistent nausea and emesis.  She states that she has been having numerous episodes of emesis a day for about 6 to 7 days and was only able to eat some yogurt yesterday.  She has also been having about 10 episodes of loose stools daily.  She mentions that her urine looks very dark, but no dysuria, frequency or hematuria..  She had a low-grade and some night sweats recently, chills, sore throat, productive cough, wheezing or hemoptysis.  No chest pain, palpitations, dizziness diaphoresis.  Denies polyuria, polydipsia, polyphagia or blurred vision.  ED Course: Initial vital signs temperature 99.4 F, pulse 92, respirations 14, blood pressure 96/68 mmHg and O2 sat 97% on room air.  The patient received IV fluids, 1 mg of hydromorphone and 1 mg of lorazepam.  UA was hazy in appearance, with small hemoglobinuria, ketonuria of 5 and proteinuria of 100 mg/dL.  The rest of the results were unremarkable.  CBC had a white count of 4.5, hemoglobin 11.1 g/dL and platelets 232.  Her chest radiograph showed  chronic bronchitic changes without consolidation  Hospital Course:  [facility] were consulted to admit the patient for further evaluation and treatment. Repeat CT abdomen was performed due to the patient's failure to progress on 10/21/2018. It demonstrated only a distended stomach and the same mild thickening of the colon noted on previous scans. Use of narcotic pain control was minimized and her diet was gradually increased. Today she is tolerating a regular diabetic diet. She states that she feels well enough to go home.  Today's assessment: S: The patient is sitting up on the side of the bed. No new complaints. O: Vitals:  Vitals:   10/23/18 0520 10/23/18 1406  BP: (!) 137/94 (!) 133/103  Pulse: 86 79  Resp: 15   Temp: 98.1 F (36.7 C) 98 F (36.7 C)  SpO2: 100% 100%   Constitutional:  The patient is awake, alert, and oriented x 3. No acute distress. Respiratory:   There is no increased work of breathing.  There is no wheezes, rales, or rhonchi.  No tactile fremitus. Cardiovascular:   Regular rate and rhythm.  No murmurs, ectopy, or gallups are appreciated.  No lateral PMI. No thrills. Abdomen:   Abdomen is less distended today, but remains diffusely tender.   No hernias, masses, or organomegaly is appreciated.  Hypoactive bowel sounds.  Musculoskeletal:   No cyanosis, clubbing, or edema. Skin:   No rashes, lesions, ulcers  palpation of skin: no induration or nodules Neurologic:   CN 2-12 intact  Sensation all 4 extremities intact   Discharge Instructions  Discharge Instructions    Activity as tolerated - No restrictions  Complete by: As directed    Call MD for:  persistant nausea and vomiting   Complete by: As directed    Call MD for:  severe uncontrolled pain   Complete by: As directed    Diet Carb Modified   Complete by: As directed    Discharge instructions   Complete by: As directed    Follow up with PCP in 7-10 days.   Increase  activity slowly   Complete by: As directed      Allergies as of 10/23/2018      Reactions   Other Anaphylaxis   Reaction to [place] nuts   Lactose Intolerance (gi) Diarrhea      Medication List    TAKE these medications   diclofenac 75 MG EC tablet Commonly known as: VOLTAREN Take 1 tablet (75 mg total) by mouth daily.   dicyclomine 20 MG tablet Commonly known as: BENTYL Take 1 tablet (20 mg total) by mouth 4 (four) times daily -  before meals and at bedtime.   FreeStyle Libre 14 Day Sensor Misc 1 Device by Does not apply route every 14 (fourteen) days.   hydrALAZINE 50 MG tablet Commonly known as: APRESOLINE Take 1 tablet (50 mg total) by mouth every 6 (six) hours.   insulin lispro 100 UNIT/ML injection Commonly known as: HUMALOG Inject 0-0.1 mLs (0-10 Units total) into the skin See admin instructions. Inject 0-10 units subcutaneously three times daily with meals per sliding scale   Lantus 100 UNIT/ML injection Generic drug: insulin glargine Inject 0.08 mLs (8 Units total) into the skin at bedtime. Patient using Vials   LORazepam 0.5 MG tablet Commonly known as: ATIVAN TAKE 1 TABLET BY MOUTH  TWICE A DAY AS NEEDED FOR  ANXIETY What changed:   how much to take  how to take this  when to take this  reasons to take this  additional instructions   metoCLOPramide 10 MG tablet Commonly known as: REGLAN Take 1 tablet (10 mg total) by mouth every 6 (six) hours as needed for nausea or vomiting.   metoprolol tartrate 25 MG tablet Commonly known as: LOPRESSOR Take 1 tablet (25 mg total) by mouth 2 (two) times daily.   mirtazapine 30 MG tablet Commonly known as: Remeron Take 1 tablet (30 mg total) by mouth at bedtime.   ondansetron 4 MG tablet Commonly known as: Zofran Take 1 tablet (4 mg total) by mouth every 8 (eight) hours as needed for nausea or vomiting (If necessary, dont combine with Reglan.).   pantoprazole 40 MG tablet Commonly known as: PROTONIX TAKE  1 TABLET BY MOUTH  DAILY What changed: when to take this   Potassium Chloride ER 20 MEQ Tbcr Take 20 mEq by mouth 2 (two) times daily.   promethazine 12.5 MG tablet Commonly known as: PHENERGAN TAKE 1 TABLET BY MOUTH  EVERY 8 HOURS AS NEEDED FOR NAUSEA AND VOMITING What changed: See the new instructions.   promethazine 25 MG suppository Commonly known as: Phenergan Place 1 suppository (25 mg total) rectally every 8 (eight) hours as needed for nausea or vomiting. What changed: Another medication with the same name was changed. Make sure you understand how and when to take each.   traMADol 50 MG tablet Commonly known as: ULTRAM Take 1 tablet (50 mg total) by mouth every 6 (six) hours as needed for moderate pain or severe pain.      Allergies  Allergen Reactions   Other Anaphylaxis    Reaction to [place] nuts  Lactose Intolerance (Gi) Diarrhea    The results of significant diagnostics from this hospitalization (including imaging, microbiology, ancillary and laboratory) are listed below for reference.    Significant Diagnostic Studies: Dg Chest 2 View  Result Date: 10/19/2018 CLINICAL DATA:  Chest pain, history hypertension, diabetes mellitus EXAM: CHEST - 2 VIEW COMPARISON:  09/18/2018 FINDINGS: EKG leads project over the upper lobes bilaterally. Normal heart size, mediastinal contours, and pulmonary vascularity. Mild chronic bronchitic changes. Lungs otherwise clear. No pleural effusion or pneumothorax. Bones unremarkable. IMPRESSION: Chronic bronchitic changes without infiltrate. Electronically Signed   By: Lavonia Dana M.D.   On: 10/19/2018 17:57   Dg Abd 1 View  Result Date: 10/22/2018 CLINICAL DATA:  Constipation, abdominal pain. EXAM: ABDOMEN - 1 VIEW COMPARISON:  Body CT October 21, 2018 FINDINGS: The bowel gas pattern is normal. Moderate amount of formed stool throughout the colon. Residual excreted contrast within the collecting systems of the kidneys and urinary bladder.  IMPRESSION: 1. Nonobstructive bowel gas pattern. 2. Constipation. Electronically Signed   By: Fidela Salisbury M.D.   On: 10/22/2018 10:29   Ct Abdomen Pelvis W Contrast  Result Date: 10/21/2018 CLINICAL DATA:  Worsening abdominal pain. Nausea and vomiting. History of gastroparesis. EXAM: CT ABDOMEN AND PELVIS WITH CONTRAST TECHNIQUE: Multidetector CT imaging of the abdomen and pelvis was performed using the standard protocol following bolus administration of intravenous contrast. CONTRAST:  28m OMNIPAQUE IOHEXOL 300 MG/ML  SOLN COMPARISON:  CT 03/23/2018, additional prior exams reviewed. FINDINGS: Lower chest: Lung bases are clear. No consolidation or pleural fluid. Hepatobiliary: Diminished hepatic steatosis from prior exam. Focal fatty infiltration adjacent with falciform ligament as before. Clips in the gallbladder fossa postcholecystectomy. No biliary dilatation. Pancreas: Mild parenchymal atrophy. No ductal dilatation or inflammation. Spleen: No splenic injury or perisplenic hematoma. Adrenals/Urinary Tract: Normal adrenal glands. No hydronephrosis or perinephric edema. Homogeneous renal enhancement. Urinary bladder is partially distended, mild bladder wall thickening. Stomach/Bowel: Stomach distended, similar to prior exam. No significant small bowel inflammation. No bowel obstruction. Small amount of enteric contrast is in distal small bowel. Low-density wall thickening of the ascending, transverse, and proximal descending colon. Associated pericolonic and mesenteric edema. Small volume of stool in the more distal colon. Appendix tentatively but not definitively identified. No evidence of appendicitis. Vascular/Lymphatic: Normal caliber abdominal aorta. The portal vein is patent. No bulky abdominopelvic adenopathy. Reproductive: Uterus and bilateral adnexa are unremarkable. Other: Moderate free fluid in the pelvis that appears simple. Mild generalized mesenteric and body wall edema. No free air. No  intra-abdominal abscess. Musculoskeletal: There are no acute or suspicious osseous abnormalities. IMPRESSION: 1. Colonic wall thickening of the ascending, transverse, and proximal descending colon with pericolonic and mesenteric edema. Findings are suspicious for colitis, similar findings seen on prior exams. 2. Moderate free fluid in the pelvis, likely reactive. Generalized body wall edema. 3. Mild bladder wall thickening. 4. Gastric distension consistent with gastroparesis. Electronically Signed   By: MKeith RakeM.D.   On: 10/21/2018 21:35    Microbiology: Recent Results (from the past 240 hour(s))  SARS Coronavirus 2 ([facility] - [facility] Hosp Order     Status: None   Collection Time: 10/13/18  6:25 PM   Specimen: Nasopharyngeal Swab  Result Value Ref Range Status   SARS Coronavirus 2 NEGATIVE NEGATIVE Final    Comment: (NOTE) If result is NEGATIVE SARS-CoV-2 target nucleic acids are NOT DETECTED. The SARS-CoV-2 RNA is generally detectable in upper and lower  respiratory specimens during the  acute phase of infection. The lowest  concentration of SARS-CoV-2 viral copies this assay can detect is 250  copies / mL. A negative result does not preclude SARS-CoV-2 infection  and should not be used as the sole basis for treatment or other  patient management decisions.  A negative result may occur with  improper specimen collection / handling, submission of specimen other  than nasopharyngeal swab, presence of viral mutation(s) within the  areas targeted by this assay, and inadequate number of viral copies  (<250 copies / mL). A negative result must be combined with clinical  observations, patient history, and epidemiological information. If result is POSITIVE SARS-CoV-2 target nucleic acids are DETECTED. The SARS-CoV-2 RNA is generally detectable in upper and lower  respiratory specimens dur ing the acute phase of infection.  Positive  results are  indicative of active infection with SARS-CoV-2.  Clinical  correlation with patient history and other diagnostic information is  necessary to determine patient infection status.  Positive results do  not rule out bacterial infection or co-infection with other viruses. If result is PRESUMPTIVE POSTIVE SARS-CoV-2 nucleic acids MAY BE PRESENT.   A presumptive positive result was obtained on the submitted specimen  and confirmed on repeat testing.  While 2019 novel coronavirus  (SARS-CoV-2) nucleic acids may be present in the submitted sample  additional confirmatory testing may be necessary for epidemiological  and / or clinical management purposes  to differentiate between  SARS-CoV-2 and other Sarbecovirus currently known to infect humans.  If clinically indicated additional testing with an alternate test  methodology [phone]) is advised. The SARS-CoV-2 RNA is generally  detectable in upper and lower respiratory sp ecimens during the acute  phase of infection. The expected result is Negative. Fact Sheet for Patients:  StrictlyIdeas.no Fact Sheet for Healthcare Providers: BankingDealers.co.za This test is not yet approved or cleared by the [place] FDA and has been authorized for detection and/or diagnosis of SARS-CoV-2 by FDA under an Emergency Use Authorization (EUA).  This EUA will remain in effect (meaning this test can be used) for the duration of the COVID-19 declaration under Section 564(b)(1) of the Act, 21 U.S.C. section 360bbb-3(b)(1), unless the authorization is terminated or revoked sooner. Performed at [facility], [place] [address], [place], [place] [address]   SARS Coronavirus 2 (CEPHEID - Performed in [place] hospital lab), Hosp Order     Status: None   Collection Time: 10/19/18  7:26 PM   Specimen: Nasopharyngeal Swab  Result Value Ref Range Status   SARS Coronavirus 2 NEGATIVE NEGATIVE Final     Comment: (NOTE) If result is NEGATIVE SARS-CoV-2 target nucleic acids are NOT DETECTED. The SARS-CoV-2 RNA is generally detectable in upper and lower  respiratory specimens during the acute phase of infection. The lowest  concentration of SARS-CoV-2 viral copies this assay can detect is 250  copies / mL. A negative result does not preclude SARS-CoV-2 infection  and should not be used as the sole basis for treatment or other  patient management decisions.  A negative result may occur with  improper specimen collection / handling, submission of specimen other  than nasopharyngeal swab, presence of viral mutation(s) within the  areas targeted by this assay, and inadequate number of viral copies  (<250 copies / mL). A negative result must be combined with clinical  observations, patient history, and epidemiological information. If result is POSITIVE SARS-CoV-2 target nucleic acids are DETECTED. The SARS-CoV-2 RNA is generally detectable in upper and lower  respiratory specimens dur ing the acute phase of infection.  Positive  results are indicative of active infection with SARS-CoV-2.  Clinical  correlation with patient history and other diagnostic information is  necessary to determine patient infection status.  Positive results do  not rule out bacterial infection or co-infection with other viruses. If result is PRESUMPTIVE POSTIVE SARS-CoV-2 nucleic acids MAY BE PRESENT.   A presumptive positive result was obtained on the submitted specimen  and confirmed on repeat testing.  While 2019 novel coronavirus  (SARS-CoV-2) nucleic acids may be present in the submitted sample  additional confirmatory testing may be necessary for epidemiological  and / or clinical management purposes  to differentiate between  SARS-CoV-2 and other Sarbecovirus currently known to infect humans.  If clinically indicated additional testing with an alternate test  methodology [phone]) is advised. The SARS-CoV-2  RNA is generally  detectable in upper and lower respiratory sp ecimens during the acute  phase of infection. The expected result is Negative. Fact Sheet for Patients:  StrictlyIdeas.no Fact Sheet for Healthcare Providers: BankingDealers.co.za This test is not yet approved or cleared by the [place] FDA and has been authorized for detection and/or diagnosis of SARS-CoV-2 by FDA under an Emergency Use Authorization (EUA).  This EUA will remain in effect (meaning this test can be used) for the duration of the COVID-19 declaration under Section 564(b)(1) of the Act, 21 U.S.C. section 360bbb-3(b)(1), unless the authorization is terminated or revoked sooner. Performed at [facility], [place] [address] [place], [place] [address]      Labs: Basic Metabolic Panel: Recent Labs  Lab 10/19/18 1738 10/19/18 2200 10/20/18 1227 10/21/18 0429 10/21/18 1842 10/22/18 0536 10/22/18 2330  NA 138  --  137 130*  --  130* 133*  K 2.7*  --  2.9*   2.9* 2.5* 3.2* 2.9* 4.0  CL 91*  --  94* 92*  --  92* 98  CO2 35*  --  29 30  --  29 30  GLUCOSE 142*  --  70 140*  --  153* 167*  BUN 16  --  13 9  --  6 5*  CREATININE 1.28*  --  0.69 0.62  --  0.53 0.63  CALCIUM 8.9  --  8.4* 7.8*  --  7.9* 8.3*  MG  --  1.9  --   --   --   --   --   PHOS  --  4.4  --   --   --   --   --    Liver Function Tests: Recent Labs  Lab 10/17/18 1400 10/19/18 1738 10/22/18 0536 10/22/18 2330  AST 24 23 16 18   ALT 49* 31 19 20   ALKPHOS 97 76 64 64  BILITOT 0.9 0.8 0.5 0.4  PROT 8.2* 7.0 5.5* 5.9*  ALBUMIN 4.1 3.5 2.8* 3.0*   Recent Labs  Lab 10/19/18 1738  LIPASE 24   No results for input(s): AMMONIA in the last 168 hours. CBC: Recent Labs  Lab 10/17/18 1400 10/19/18 1738 10/21/18 0429 10/22/18 0536  WBC 5.6 4.5 5.8 4.8  NEUTROABS 3.0  --  2.9 1.7  HGB 11.7* 11.1* 9.4* 9.2*  HCT 35.5* 34.2* 28.5* 27.5*  MCV 83.7 85.3 84.3 83.8    PLT 238 232 200 190   Cardiac Enzymes: No results for input(s): CKTOTAL, CKMB, CKMBINDEX, TROPONINI in the last 168 hours. BNP: BNP (last 3 results) No results for input(s): BNP in the last 8760 hours.  ProBNP (last 3 results) No results for input(s): PROBNP in the last 8760 hours.  CBG: Recent Labs  Lab 10/23/18 0349 10/23/18 0757 10/23/18 0829 10/23/18 1033 10/23/18 1229  GLUCAP 87 77 68* 117* 84    Active Problems:   Chronic abdominal pain   Hypertension associated with diabetes ([place])   Diabetes mellitus type 1 (H[facility])   Hypokalemia   GERD (gastroesophageal reflux disease)   Normocytic anemia   Diabetic gastroparesis associated with type 1 diabetes mellitus ([place])   Diabetic gastroparesis ([place])   Time coordinating discharge: 38 minutes.  Signed:        Emiliana Blaize, DO [facility]  10/23/2018, 3:32 PM

## 2018-10-24 NOTE — Progress Notes (Signed)
Patient discharged to home with family, discharge instructions reviewed with patient who verbalized understanding. RX's sent electronically to pharmacy.

## 2018-10-25 ENCOUNTER — Other Ambulatory Visit: Payer: Self-pay

## 2018-10-25 ENCOUNTER — Telehealth: Payer: Self-pay

## 2018-10-25 ENCOUNTER — Encounter: Payer: Self-pay | Admitting: Internal Medicine

## 2018-10-25 ENCOUNTER — Other Ambulatory Visit: Payer: Self-pay | Admitting: *Deleted

## 2018-10-25 DIAGNOSIS — E1143 Type 2 diabetes mellitus with diabetic autonomic (poly)neuropathy: Secondary | ICD-10-CM

## 2018-10-25 NOTE — Telephone Encounter (Signed)
LM for patient to return call for TCM. 

## 2018-10-25 NOTE — Patient Outreach (Signed)
[facility]) Care Management  7/41/4239  [name] [birth year] [id number]    Referral received 10/25/2018 Initial Outreach 10/25/2018 Transition of care to be completed by provider's office  Telephone Assessment  RN attempted the initial outreach however unsuccessful. RN able to leave a HIPAA approved voice message requesting a call back.   Will further engage at that time for pending [facility] services. Will scheduled another follow call within 4 business days.  Raina Mina, RN Care Management Coordinator [place] Office [phone]

## 2018-10-26 ENCOUNTER — Ambulatory Visit: Payer: 59 | Admitting: Family Medicine

## 2018-10-26 DIAGNOSIS — Z0289 Encounter for other administrative examinations: Secondary | ICD-10-CM

## 2018-10-26 NOTE — Progress Notes (Deleted)
[facility] [address] [place], [place] [address] Phone: [phone] Subjective:    I'm seeing this patient by the request  of:    CC:   [id number]  [name] is a 28 y.o. female coming in with complaint of ***  Onset-  Location Duration-  Character- Aggravating factors- Reliving factors-  Therapies tried-  Severity-     Past Medical History:  Diagnosis Date  . Allergy   . Anemia   . Anxiety   . Blood transfusion without reported diagnosis    Dec 2018  . Cataract    right eye  . Depression   . Diabetes type 1, uncontrolled ([place]) 11/14/2011   Since age 23  . Fibromyalgia   . Gastroparesis   . GERD (gastroesophageal reflux disease)   . Hypertension   . Infection    UTI April 2016   Past Surgical History:  Procedure Laterality Date  . ANKLE SURGERY    . CHOLECYSTECTOMY  11/15/2011   Procedure: LAPAROSCOPIC CHOLECYSTECTOMY WITH INTRAOPERATIVE CHOLANGIOGRAM;  Surgeon: [name], MD;  Location: [facility];  Service: General;  Laterality: N/A;  . COLONOSCOPY    . COLONOSCOPY WITH PROPOFOL N/A 06/27/2017   Procedure: COLONOSCOPY WITH PROPOFOL;  Surgeon: Milus Banister, MD;  Location: [facility];  Service: Endoscopy;  Laterality: N/A;  . ESOPHAGOGASTRODUODENOSCOPY  12/03/2011   Procedure: ESOPHAGOGASTRODUODENOSCOPY (EGD);  Surgeon: Beryle Beams, MD;  Location: [facility];  Service: Endoscopy;  Laterality: N/A;  . FLEXIBLE SIGMOIDOSCOPY N/A 03/10/2017   Procedure: FLEXIBLE SIGMOIDOSCOPY;  Surgeon: Carol Ada, MD;  Location: [facility];  Service: Endoscopy;  Laterality: N/A;  . INCISION AND DRAINAGE PERIRECTAL ABSCESS N/A 03/01/2017   Procedure: IRRIGATION AND DEBRIDEMENT PERIRECTAL ABSCESS;  Surgeon: Alphonsa Overall, MD;  Location: [facility];  Service: General;  Laterality: N/A;  . IRRIGATION AND DEBRIDEMENT BUTTOCKS N/A 03/23/2017   Procedure: IRRIGATION AND DEBRIDEMENT BUTTOCKS, SETON PLACEMENT;  Surgeon: [name],  MD;  Location: [facility];  Service: General;  Laterality: N/A;  . LAPAROSCOPY  11/23/2011   Procedure: LAPAROSCOPY DIAGNOSTIC;  Surgeon: Edward Jolly, MD;  Location: [facility];  Service: General;  Laterality: N/A;  . SIGMOIDOSCOPY    . UPPER GASTROINTESTINAL ENDOSCOPY    . WISDOM TOOTH EXTRACTION     Social History   Socioeconomic History  . Marital status: Married    Spouse name: sergio  . Number of children: 0  . Years of education: college  . Highest education level: Not on file  Occupational History  . Occupation: Publishing rights manager call center    Employer: [employer]  . Financial resource strain: Not on file  . Food insecurity    Worry: Not on file    Inability: Not on file  . Transportation needs    Medical: Not on file    Non-medical: Not on file  Tobacco Use  . Smoking status: Never Smoker  . Smokeless tobacco: Never Used  Substance and Sexual Activity  . Alcohol use: No    Alcohol/week: 0.0 standard drinks    Frequency: Never  . Drug use: No  . Sexual activity: Not Currently    Partners: Male    Birth control/protection: None  Lifestyle  . Physical activity    Days per week: Not on file    Minutes per session: Not on file  . Stress: Not on file  Relationships  . Social connections    Talks on phone: Not on file  Gets together: Not on file    Attends religious service: Not on file    Active member of club or organization: Not on file    Attends meetings of clubs or organizations: Not on file    Relationship status: Not on file  Other Topics Concern  . Not on file  Social History Narrative  . Not on file   Allergies  Allergen Reactions  . Other Anaphylaxis    Reaction to [place] nuts   . Lactose Intolerance (Gi) Diarrhea   Family History  Problem Relation Age of Onset  . Diabetes Mother   . Hypertension Father   . Colon cancer Paternal Grandmother        pt thinks PGM was dx in her 18's  . Diabetes Paternal Grandmother   .  Diabetes Maternal Grandmother   . Diabetes Maternal Grandfather   . Diabetes Paternal Grandfather   . Diabetes Other   . Esophageal cancer Neg Hx   . Liver cancer Neg Hx   . Pancreatic cancer Neg Hx   . Stomach cancer Neg Hx   . Rectal cancer Neg Hx     Current Outpatient Medications (Endocrine & Metabolic):  .  insulin lispro (HUMALOG) 100 UNIT/ML injection, Inject 0-0.1 mLs (0-10 Units total) into the skin See admin instructions. Inject 0-10 units subcutaneously three times daily with meals per sliding scale .  LANTUS 100 UNIT/ML injection, Inject 0.08 mLs (8 Units total) into the skin at bedtime. Patient using Vials  Current Outpatient Medications (Cardiovascular):  .  hydrALAZINE (APRESOLINE) 50 MG tablet, Take 1 tablet (50 mg total) by mouth every 6 (six) hours. .  metoprolol tartrate (LOPRESSOR) 25 MG tablet, Take 1 tablet (25 mg total) by mouth 2 (two) times daily.  Current Outpatient Medications (Respiratory):  .  promethazine (PHENERGAN) 12.5 MG tablet, TAKE 1 TABLET BY MOUTH  EVERY 8 HOURS AS NEEDED FOR NAUSEA AND VOMITING (Patient taking differently: Take 12.5 mg by mouth every 8 (eight) hours as needed for nausea or vomiting. ) .  promethazine (PHENERGAN) 25 MG suppository, Place 1 suppository (25 mg total) rectally every 8 (eight) hours as needed for nausea or vomiting.  Current Outpatient Medications (Analgesics):  .  diclofenac (VOLTAREN) 75 MG EC tablet, Take 1 tablet (75 mg total) by mouth daily. .  traMADol (ULTRAM) 50 MG tablet, Take 1 tablet (50 mg total) by mouth every 6 (six) hours as needed for moderate pain or severe pain.   Current Outpatient Medications (Other):  Marland Kitchen  Continuous Blood Gluc Sensor (FREESTYLE LIBRE 14 DAY SENSOR) MISC, 1 Device by Does not apply route every 14 (fourteen) days. Marland Kitchen  dicyclomine (BENTYL) 20 MG tablet, Take 1 tablet (20 mg total) by mouth 4 (four) times daily -  before meals and at bedtime. Marland Kitchen  LORazepam (ATIVAN) 0.5 MG tablet, TAKE 1  TABLET BY MOUTH  TWICE A DAY AS NEEDED FOR  ANXIETY (Patient taking differently: Take 0.5 mg by mouth 2 (two) times daily as needed for anxiety. ) .  metoCLOPramide (REGLAN) 10 MG tablet, Take 1 tablet (10 mg total) by mouth every 6 (six) hours as needed for nausea or vomiting. .  mirtazapine (REMERON) 30 MG tablet, Take 1 tablet (30 mg total) by mouth at bedtime. .  ondansetron (ZOFRAN) 4 MG tablet, Take 1 tablet (4 mg total) by mouth every 8 (eight) hours as needed for nausea or vomiting (If necessary, dont combine with Reglan.). Marland Kitchen  pantoprazole (PROTONIX) 40 MG tablet, TAKE 1 TABLET  BY MOUTH  DAILY .  potassium chloride 20 MEQ TBCR, Take 20 mEq by mouth 2 (two) times daily.    Past medical history, social, surgical and family history all reviewed in electronic medical record.  No pertanent information unless stated regarding to the chief complaint.   Review of Systems:  No headache, visual changes, nausea, vomiting, diarrhea, constipation, dizziness, abdominal pain, skin rash, fevers, chills, night sweats, weight loss, swollen lymph nodes, body aches, joint swelling, muscle aches, chest pain, shortness of breath, mood changes.   Objective  There were no vitals taken for this visit. Systems examined below as of    General: No apparent distress alert and oriented x3 mood and affect normal, dressed appropriately.  HEENT: Pupils equal, extraocular movements intact  Respiratory: Patient's speak in full sentences and does not appear short of breath  Cardiovascular: No lower extremity edema, non tender, no erythema  Skin: Warm dry intact with no signs of infection or rash on extremities or on axial skeleton.  Abdomen: Soft nontender  Neuro: Cranial nerves II through XII are intact, neurovascularly intact in all extremities with 2+ DTRs and 2+ pulses.  Lymph: No lymphadenopathy of posterior or anterior cervical chain or axillae bilaterally.  Gait normal with good balance and coordination.  MSK:   Non tender with full range of motion and good stability and symmetric strength and tone of shoulders, elbows, wrist, hip, knee and ankles bilaterally.     Impression and Recommendations:     This case required medical decision making of moderate complexity. The above documentation has been reviewed and is accurate and complete Lyndal Pulley, DO       Note: This dictation was prepared with Dragon dictation along with smaller phrase technology. Any transcriptional errors that result from this process are unintentional.

## 2018-10-27 NOTE — Telephone Encounter (Signed)
Rx request 

## 2018-10-27 NOTE — Telephone Encounter (Signed)
rx request 

## 2018-10-28 ENCOUNTER — Telehealth: Payer: Self-pay

## 2018-10-28 ENCOUNTER — Other Ambulatory Visit: Payer: Self-pay

## 2018-10-28 ENCOUNTER — Telehealth: Payer: Self-pay | Admitting: Endocrinology

## 2018-10-28 DIAGNOSIS — E1069 Type 1 diabetes mellitus with other specified complication: Secondary | ICD-10-CM

## 2018-10-28 MED ORDER — DEXCOM G6 SENSOR MISC: 1.0000 | 1 refills | Status: DC

## 2018-10-28 MED ORDER — DEXCOM G6 TRANSMITTER MISC: 1.0000 | 3 refills | Status: DC

## 2018-10-28 MED ORDER — DEXCOM G6 RECEIVER DEVI: 1.0000 | 0 refills | Status: DC

## 2018-10-28 NOTE — Telephone Encounter (Signed)
LOV 06/18/18. Per office note, pt was advised to follow up in 1-2 weeks. Received notification from pt stating insurance would no longer cover Freestyle but would cover Dexcom. Dr. [name] reviewed request and has advised pt would need an appt to further discuss. This message routed to front desk for scheduling purposes.

## 2018-10-28 NOTE — Telephone Encounter (Signed)
Patient has been scheduled for appointment on Friday 10/29/18 at 8:45 a.m.

## 2018-10-28 NOTE — Telephone Encounter (Signed)
E-Prescribing Status: Receipt confirmed by pharmacy (10/28/2018 4:25 AM EDT)  Uncertain if this will be covered by [facility] as this Rx generally needs to be sent to [facility] or [facility]. Will await their response and discuss with pt should [facility] decide NOT to cover.

## 2018-10-28 NOTE — Telephone Encounter (Signed)
[facility] called to inform that Freestyle is no longer covered under patients insurance but Dexcom is.  Please Advise, Thanks   Reference # [id number]

## 2018-10-29 ENCOUNTER — Ambulatory Visit: Payer: 59 | Admitting: Endocrinology

## 2018-10-29 ENCOUNTER — Other Ambulatory Visit: Payer: Self-pay | Admitting: *Deleted

## 2018-10-29 NOTE — Patient Outreach (Signed)
[facility]) Care Management  8/33/5825  DERRISHA FOOS [birth year] [phone]  Telephone Assessment  Provider to completed transition of care d/c 7/25  RN attempted a 2nd outreach call however remains unsuccessful. Will rescheduled another outreach within the next 4 business days for pending [facility] services.  Note outreach letter was sent earlier this week with no response at this time.   Raina Mina, RN Care Management Coordinator [place] Office [phone]

## 2018-11-01 ENCOUNTER — Other Ambulatory Visit: Payer: Self-pay

## 2018-11-01 ENCOUNTER — Emergency Department (HOSPITAL_COMMUNITY): Payer: 59

## 2018-11-01 ENCOUNTER — Encounter (HOSPITAL_COMMUNITY): Payer: Self-pay | Admitting: Emergency Medicine

## 2018-11-01 ENCOUNTER — Emergency Department (HOSPITAL_COMMUNITY)
Admission: EM | Admit: 2018-11-01 | Discharge: 2018-11-01 | Disposition: A | Discharge status: Home / Self Care | Payer: 59 | Attending: Emergency Medicine | Admitting: Emergency Medicine

## 2018-11-01 DIAGNOSIS — R197 Diarrhea, unspecified: Secondary | ICD-10-CM | POA: Insufficient documentation

## 2018-11-01 DIAGNOSIS — Z794 Long term (current) use of insulin: Secondary | ICD-10-CM | POA: Diagnosis not present

## 2018-11-01 DIAGNOSIS — R0789 Other chest pain: Secondary | ICD-10-CM | POA: Insufficient documentation

## 2018-11-01 DIAGNOSIS — I10 Essential (primary) hypertension: Secondary | ICD-10-CM | POA: Insufficient documentation

## 2018-11-01 DIAGNOSIS — Y92232 Corridor of hospital as the place of occurrence of the external cause: Secondary | ICD-10-CM | POA: Insufficient documentation

## 2018-11-01 DIAGNOSIS — R112 Nausea with vomiting, unspecified: Secondary | ICD-10-CM | POA: Insufficient documentation

## 2018-11-01 DIAGNOSIS — G8929 Other chronic pain: Secondary | ICD-10-CM

## 2018-11-01 DIAGNOSIS — Y999 Unspecified external cause status: Secondary | ICD-10-CM | POA: Diagnosis not present

## 2018-11-01 DIAGNOSIS — S098XXA Other specified injuries of head, initial encounter: Secondary | ICD-10-CM | POA: Insufficient documentation

## 2018-11-01 DIAGNOSIS — W19XXXA Unspecified fall, initial encounter: Secondary | ICD-10-CM | POA: Diagnosis not present

## 2018-11-01 DIAGNOSIS — Z79899 Other long term (current) drug therapy: Secondary | ICD-10-CM | POA: Insufficient documentation

## 2018-11-01 DIAGNOSIS — E109 Type 1 diabetes mellitus without complications: Secondary | ICD-10-CM | POA: Insufficient documentation

## 2018-11-01 DIAGNOSIS — Y9301 Activity, walking, marching and hiking: Secondary | ICD-10-CM | POA: Insufficient documentation

## 2018-11-01 LAB — URINALYSIS, ROUTINE W REFLEX MICROSCOPIC
Bilirubin Urine: NEGATIVE
Glucose, UA: >=500 mg/dL — AB
Ketones, ur: 5 mg/dL — AB
Nitrite: NEGATIVE
Protein, ur: 30 mg/dL — AB
Specific Gravity, Urine: 1.009 (ref 1.005–1.030)
pH: 8 (ref 5.0–8.0)

## 2018-11-01 LAB — BASIC METABOLIC PANEL
Anion gap: 15 (ref 5–15)
BUN: 16 mg/dL (ref 6–20)
CO2: 31 mmol/L (ref 22–32)
Calcium: 9.8 mg/dL (ref 8.9–10.3)
Chloride: 90 mmol/L — ABNORMAL LOW (ref 98–111)
Creatinine, Ser: 0.99 mg/dL (ref 0.44–1.00)
GFR calc Af Amer: >60 mL/min (ref >60)
GFR calc non Af Amer: >60 mL/min (ref >60)
Glucose, Bld: 454 mg/dL — ABNORMAL HIGH (ref 70–99)
Potassium: 4.2 mmol/L (ref 3.5–5.1)
Sodium: 136 mmol/L (ref 135–145)

## 2018-11-01 LAB — CBC
HCT: 34 % — ABNORMAL LOW (ref 36.0–46.0)
Hemoglobin: 10.9 g/dL — ABNORMAL LOW (ref 12.0–15.0)
MCH: 27.9 pg (ref 26.0–34.0)
MCHC: 32.1 g/dL (ref 30.0–36.0)
MCV: 87 fL (ref 80.0–100.0)
Platelets: 164 10*3/uL (ref 150–400)
RBC: 3.91 MIL/uL (ref 3.87–5.11)
RDW: 13.2 % (ref 11.5–15.5)
WBC: 3.9 10*3/uL — ABNORMAL LOW (ref 4.0–10.5)
nRBC: 0 % (ref 0.0–0.2)

## 2018-11-01 LAB — CBG MONITORING, ED: Glucose-Capillary: 436 mg/dL — ABNORMAL HIGH (ref 70–99)

## 2018-11-01 LAB — LIPASE, BLOOD: Lipase: 22 U/L (ref 11–51)

## 2018-11-01 LAB — I-STAT BETA HCG BLOOD, ED (MC, WL, AP ONLY): I-stat hCG, quantitative: <5 m[IU]/mL (ref <5)

## 2018-11-01 LAB — TROPONIN I (HIGH SENSITIVITY): Troponin I (High Sensitivity): <2 ng/L (ref <18)

## 2018-11-01 IMAGING — CT CT HEAD WITHOUT CONTRAST
3 series · 15 of 47 positions shown, 18 images · non-contrast
Comparison: None.

CLINICAL DATA: Head trauma

EXAM:
CT HEAD WITHOUT CONTRAST
TECHNIQUE: Contiguous axial images were obtained from the base of the skull
through the vertex without intravenous contrast.

[Series 2: head wo · axial · 0.45mm/px · z∈[-151,-26]mm · 9 of 31 slices shown, 12 images]
[im 3/31  brain]
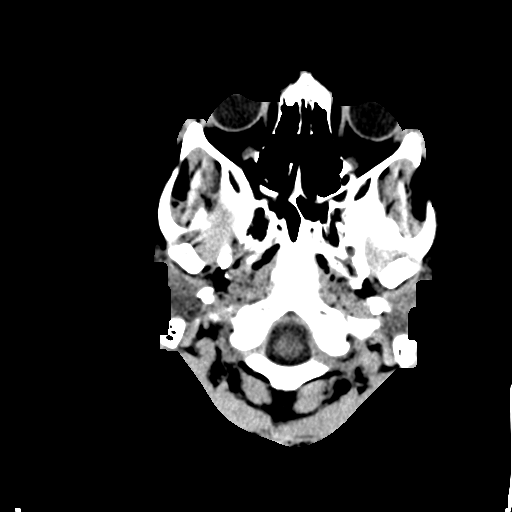
[im 3/31  bone]
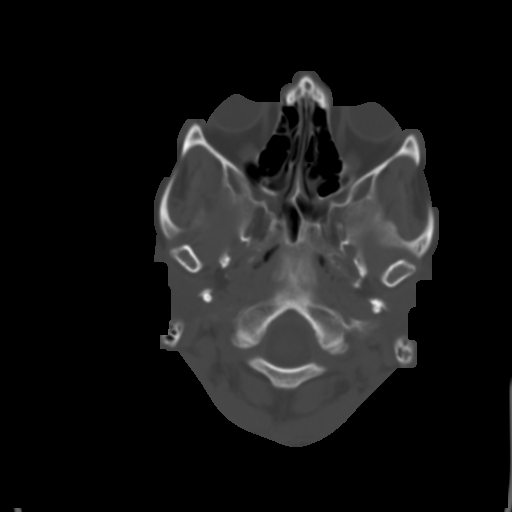
[im 6/31  brain]
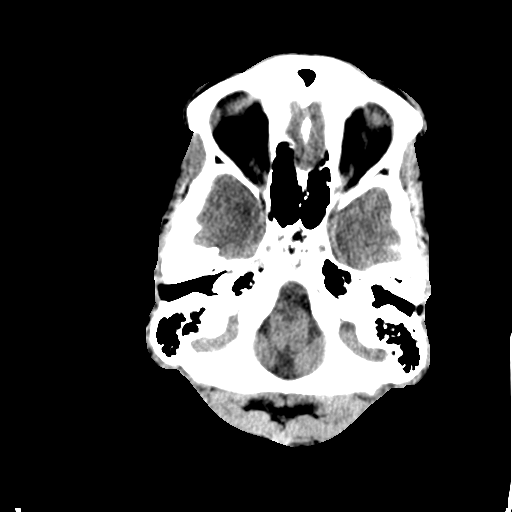
[im 9/31  brain]
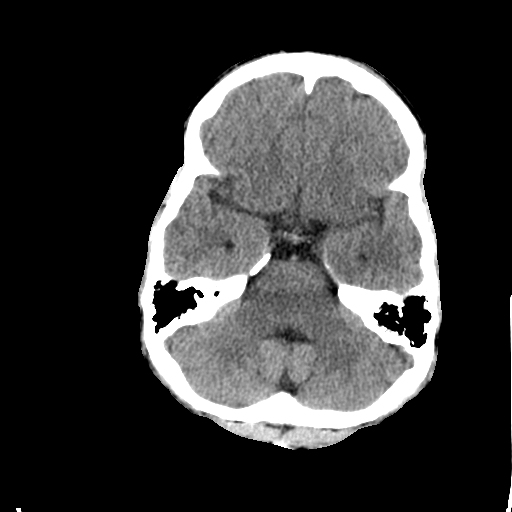
[im 12/31  brain]
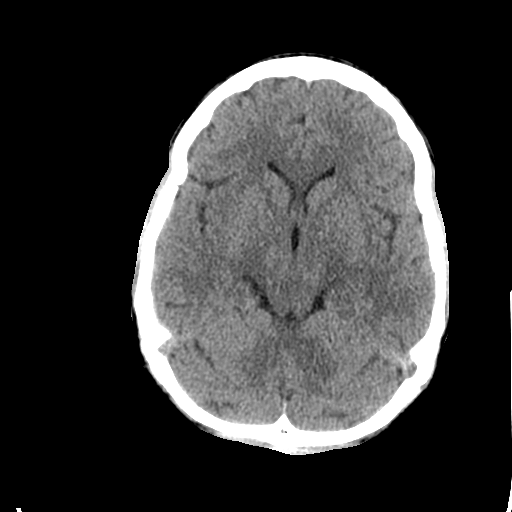
[im 16/31  brain]
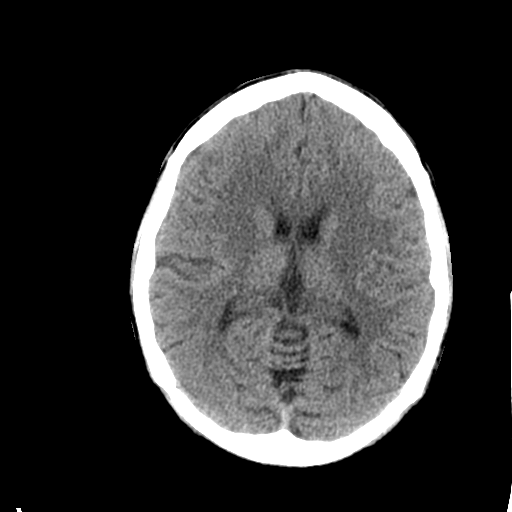
[im 16/31  bone]
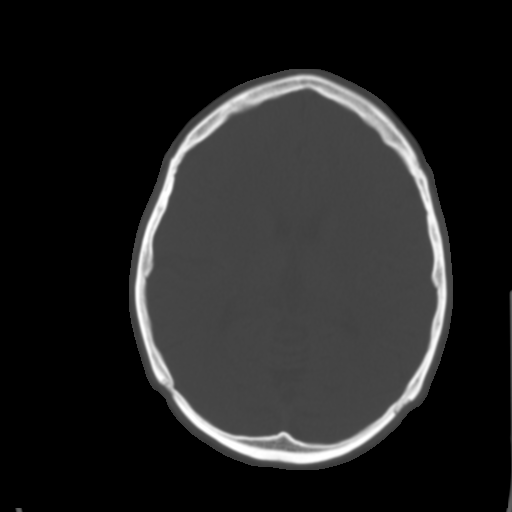
[im 19/31  brain]
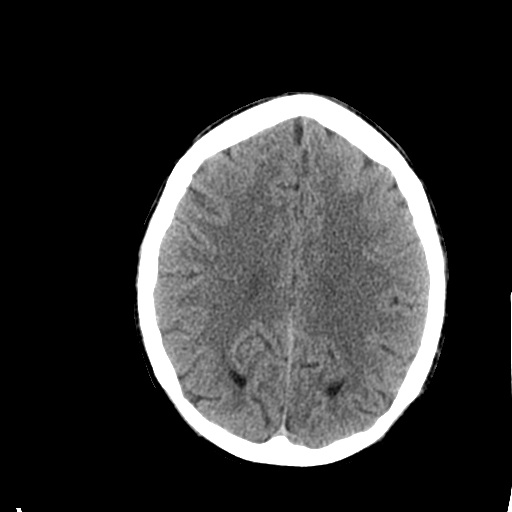
[im 22/31  brain]
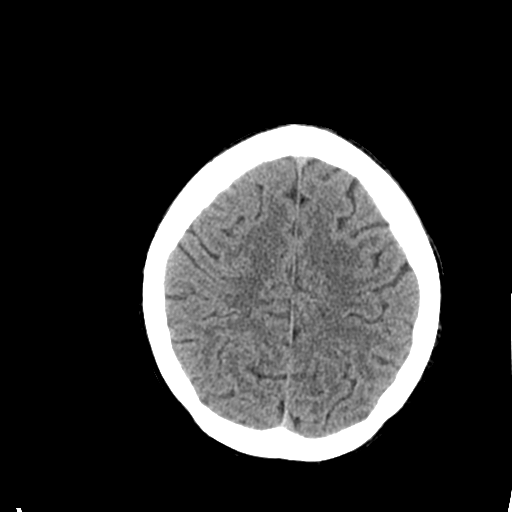
[im 25/31  brain]
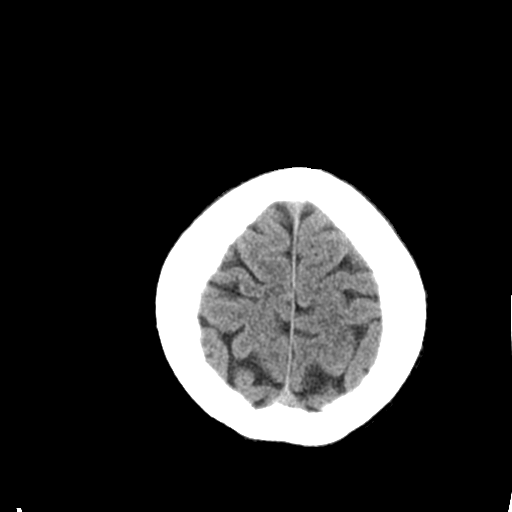
[im 28/31  brain]
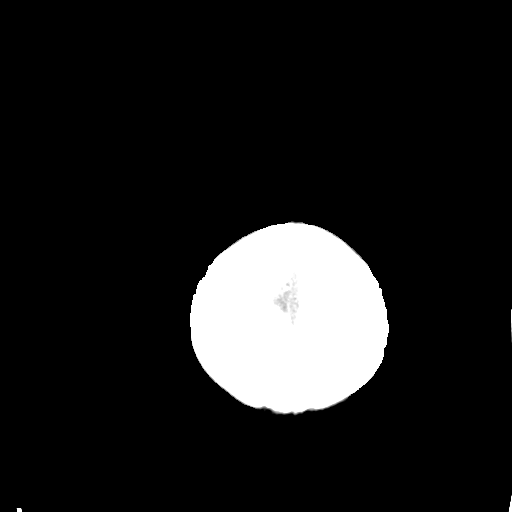
[im 28/31  bone]
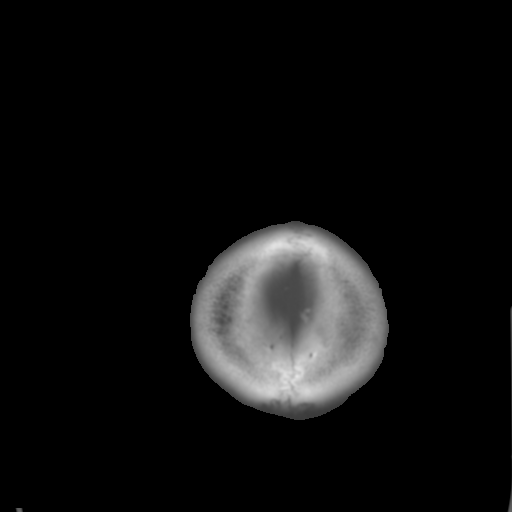

[Series 4: coronal soft tissue · coronal · 0.31mm/px · 3 of 68 slices shown]
[im 23/68  brain]
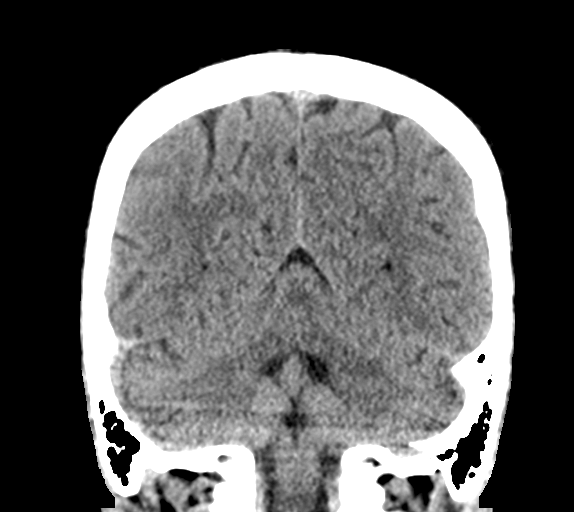
[im 30/68  brain]
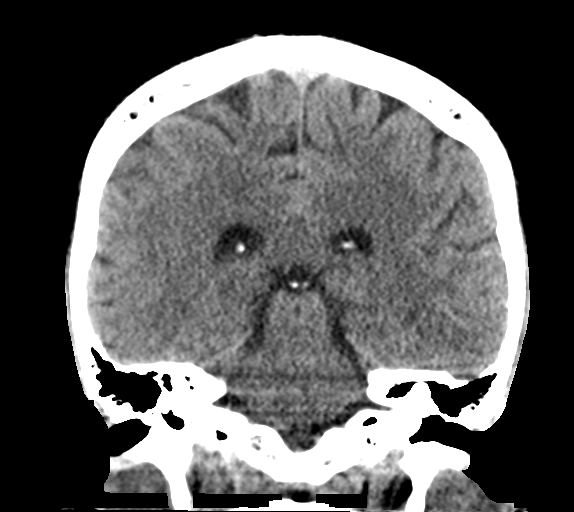
[im 38/68  brain]
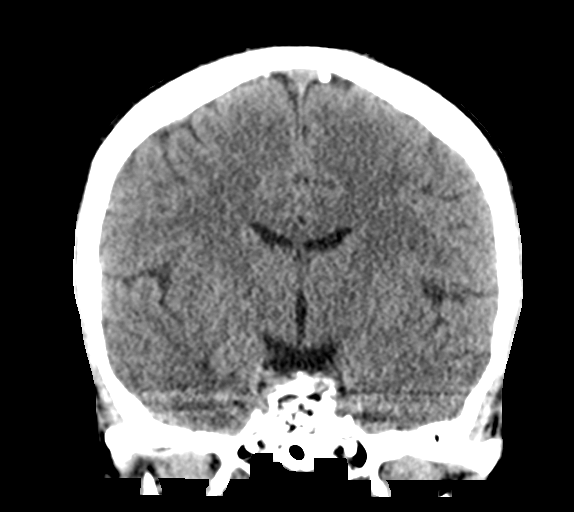

[Series 5: sagittal soft tissue · sagittal · 0.32mm/px · 3 of 57 slices shown]
[im 19/57  brain]
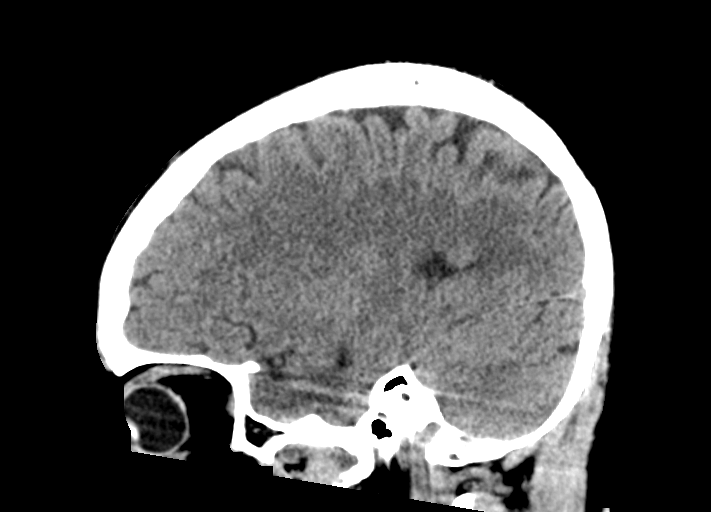
[im 29/57  brain]
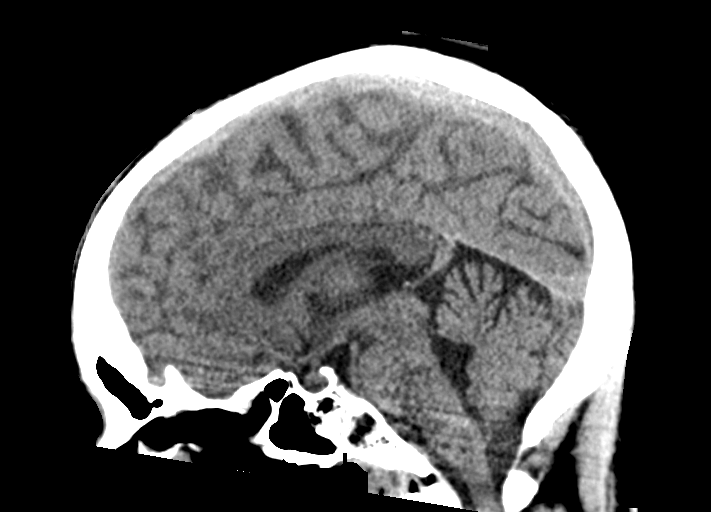
[im 38/57  brain]
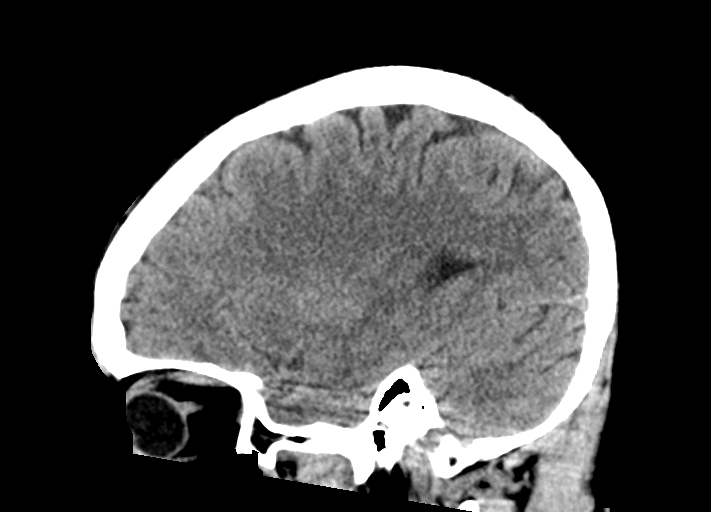

[15 of 47 positions shown; findings below may reference images not displayed]

FINDINGS: Brain: No evidence of acute territorial infarction, hemorrhage,
hydrocephalus,extra-axial collection or mass lesion/mass effect.
Normal gray-white differentiation. Ventricles are normal in size and
contour.

Vascular: No hyperdense vessel or unexpected calcification.

Skull: The skull is intact. No fracture or focal lesion identified.

Sinuses/Orbits: The visualized paranasal sinuses and mastoid air
cells are clear. The orbits and globes intact.

Other: None
IMPRESSION: No acute intracranial abnormality.

## 2018-11-01 IMAGING — CR CHEST - 2 VIEW
2 series · 2 of 2 positions shown · non-contrast
Comparison: [DATE]

CLINICAL DATA: Chest pain

EXAM:
CHEST - 2 VIEW

[w chest pa]
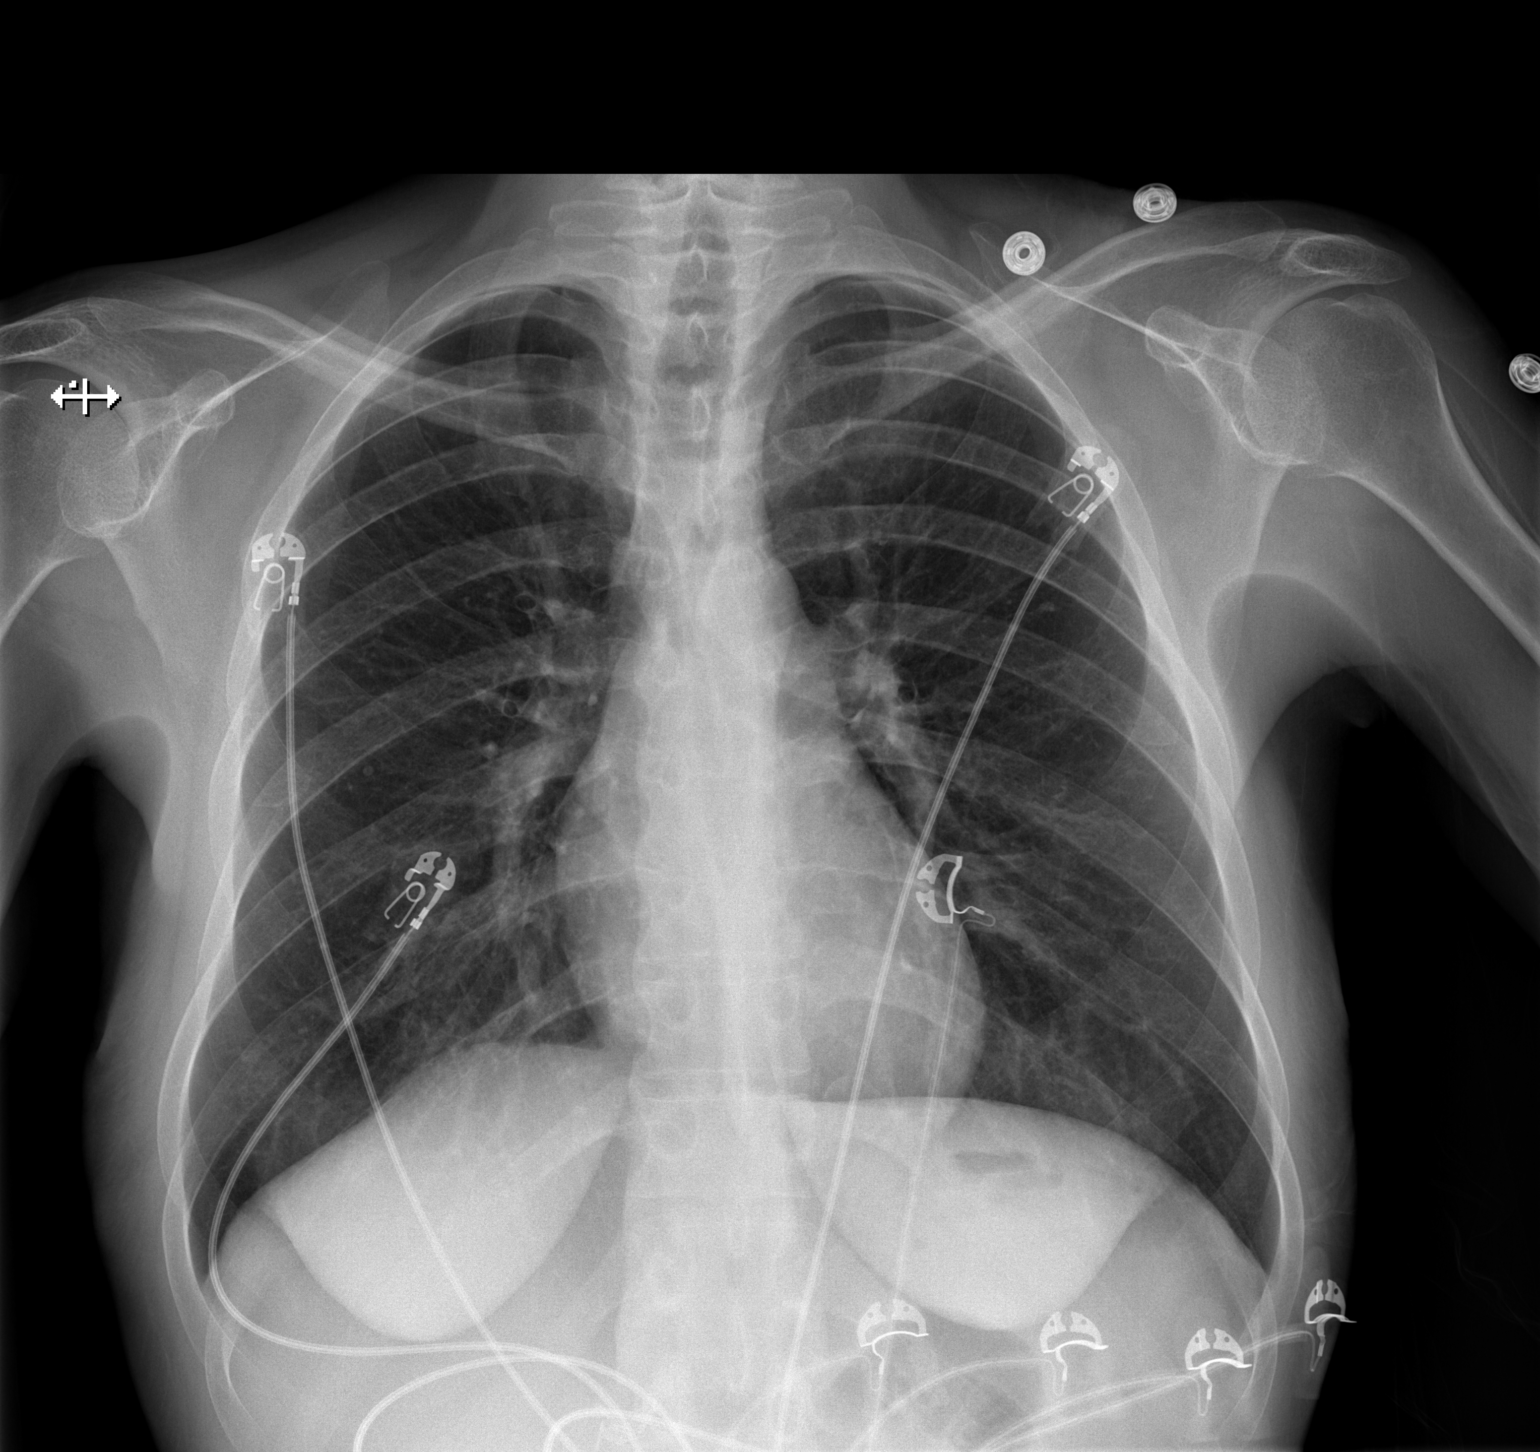

[w chest lat]
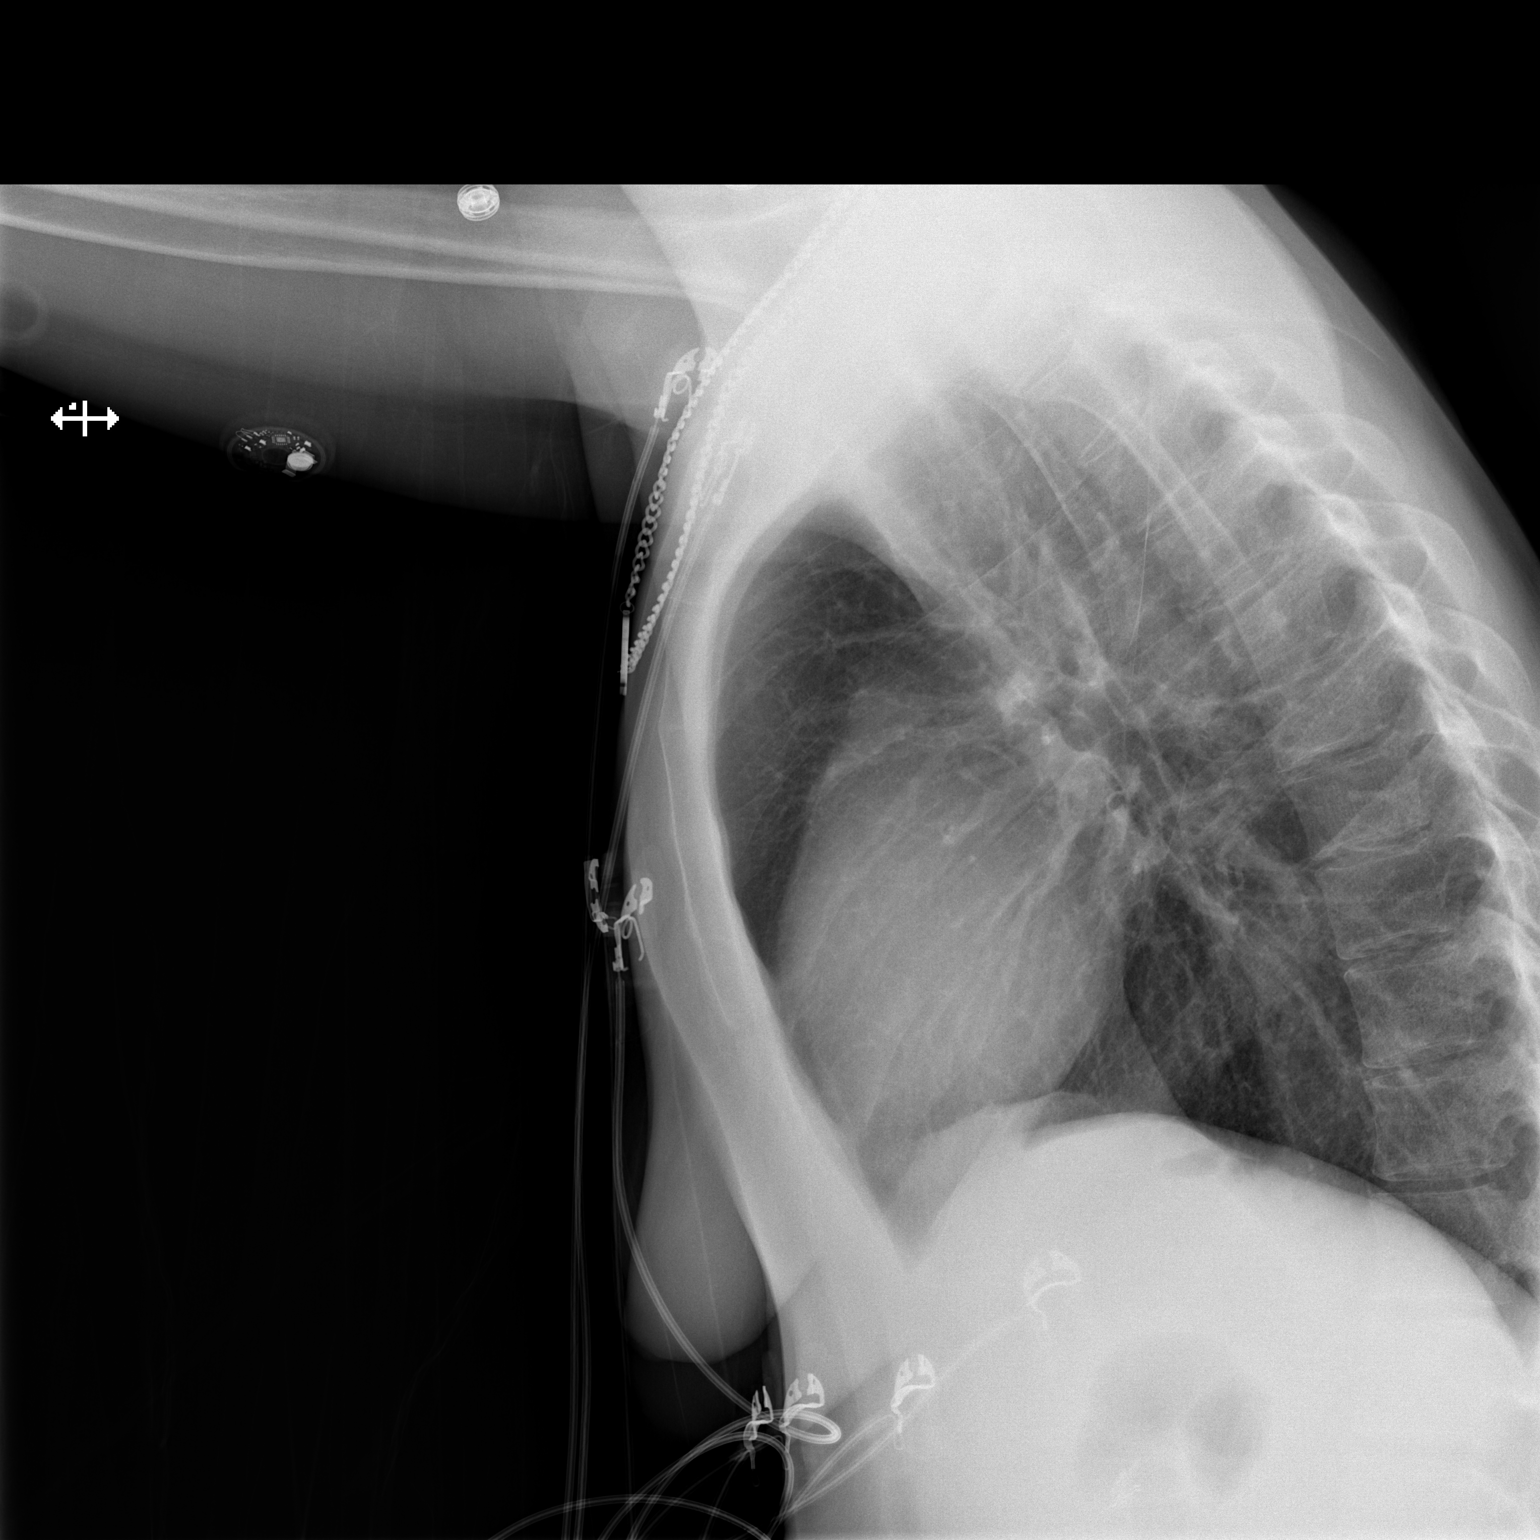

[2 of 2 positions shown; findings below may reference images not displayed]

FINDINGS: Lungs are clear. Heart size and pulmonary vascularity are normal. No
adenopathy. No pneumothorax. No bone lesions.
IMPRESSION: No edema or consolidation.

## 2018-11-01 MED ORDER — LORAZEPAM 2 MG/ML IJ SOLN
1.0000 mg | Freq: Once | INTRAMUSCULAR | Status: AC
  Administered 2018-11-01: 14:00:00 1 mg via INTRAVENOUS
  Filled 2018-11-01: qty 1

## 2018-11-01 MED ORDER — SODIUM CHLORIDE 0.9 % IV BOLUS
2000.0000 mL | Freq: Once | INTRAVENOUS | Status: AC
  Administered 2018-11-01: 14:00:00 2000 mL via INTRAVENOUS

## 2018-11-01 MED ORDER — NALOXONE HCL 2 MG/2ML IJ SOSY
PREFILLED_SYRINGE | INTRAMUSCULAR | Status: AC
  Administered 2018-11-01: 2 mg
  Filled 2018-11-01: qty 2

## 2018-11-01 MED ORDER — NALOXONE HCL 0.4 MG/ML IJ SOLN
0.4000 mg | Freq: Once | INTRAMUSCULAR | Status: AC
  Administered 2018-11-01: 0.4 mg via INTRAVENOUS

## 2018-11-01 MED ORDER — SODIUM CHLORIDE 0.9% FLUSH: 3.0000 mL | Freq: Once | INTRAVENOUS | Status: DC

## 2018-11-01 MED ORDER — PROMETHAZINE HCL 25 MG/ML IJ SOLN
25.0000 mg | Freq: Once | INTRAMUSCULAR | Status: AC
  Administered 2018-11-01: 25 mg via INTRAVENOUS
  Filled 2018-11-01: qty 1

## 2018-11-01 MED ORDER — FENTANYL CITRATE (PF) 100 MCG/2ML IJ SOLN
100.0000 ug | Freq: Once | INTRAMUSCULAR | Status: AC
  Administered 2018-11-01: 100 ug via INTRAVENOUS
  Filled 2018-11-01: qty 2

## 2018-11-01 MED ORDER — SODIUM CHLORIDE 0.9% FLUSH
3.0000 mL | Freq: Once | INTRAVENOUS | Status: AC
  Administered 2018-11-01: 3 mL via INTRAVENOUS

## 2018-11-01 NOTE — Discharge Instructions (Addendum)
Return here as needed.  You will need to follow-up with your primary doctor for a recheck.  Increase your fluid intake.

## 2018-11-01 NOTE — ED Provider Notes (Signed)
[facility]-EMERGENCY DEPT Provider Note   CSN: [id number] Arrival date & time: 11/01/18  1220     History   Chief Complaint Chief Complaint  Patient presents with  . Multiple Complaints    HPI [name] is a 28 y.o. female.     HPI Patient presents to the emergency department with pain in her chest abdomen and back.  The patient states this is a chronic problem for her.  The patient states that she always gets treated poorly at the hospital because she asked for pain medicines for her symptoms.  Patient states that nothing seems to make the condition better or worse.  The patient states that she has this pain frequently.  She is also complaining of nausea vomiting and diarrhea.  She states these are chronic problems for her as well.  The patient denies  shortness of breath, headache,blurred vision, neck pain, fever, cough, weakness, numbness, dizziness, anorexia, edema, rash, dysuria, hematemesis, bloody stool, near syncope, or syncope. Past Medical History:  Diagnosis Date  . Allergy   . Anemia   . Anxiety   . Blood transfusion without reported diagnosis    Dec 2018  . Cataract    right eye  . Depression   . Diabetes type 1, uncontrolled (HCC) 11/14/2011   Since age 28  . Fibromyalgia   . Gastroparesis   . GERD (gastroesophageal reflux disease)   . Hypertension   . Infection    UTI April 2016    Patient Active Problem List   Diagnosis Date Noted  . Diabetic gastroparesis (HCC) 10/19/2018  . DKA, type 1 (HCC) 09/18/2018  . Pneumonia 08/20/2018  . Contraception management 08/06/2018  . Intractable vomiting with nausea 07/30/2018  . Intractable vomiting   . Cyclical vomiting syndrome 06/07/2018  . Diabetic gastroparesis associated with type 1 diabetes mellitus (HCC) 06/04/2018  . Anxiety 05/31/2018  . Peripheral edema 05/31/2018  . Acute kidney injury (HCC) 03/22/2018  . Normocytic anemia 07/10/2017  . Hypokalemia 06/23/2017  . GERD  (gastroesophageal reflux disease) 06/23/2017  . Intractable nausea and vomiting 04/11/2017  . 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 Diabetes mellitus type 1 (HCC) 04/28/2013  . Protein-calorie malnutrition, severe (HCC) 04/28/2013  . Hypertension associated with diabetes (HCC) 11/14/2011  . Chronic abdominal pain 09/18/2011    Past Surgical History:  Procedure Laterality Date  . ANKLE SURGERY    . CHOLECYSTECTOMY  11/15/2011   Procedure: LAPAROSCOPIC CHOLECYSTECTOMY WITH INTRAOPERATIVE CHOLANGIOGRAM;  Surgeon: Ardeth SportsmanSteven C. Gross, MD;  Location: [facility];  Service: General;  Laterality: N/A;  . COLONOSCOPY    . COLONOSCOPY WITH PROPOFOL N/A 06/27/2017   Procedure: COLONOSCOPY WITH PROPOFOL;  Surgeon: Rachael FeeJacobs, Daniel P, MD;  Location: [facility];  Service: Endoscopy;  Laterality: N/A;  . ESOPHAGOGASTRODUODENOSCOPY  12/03/2011   Procedure: ESOPHAGOGASTRODUODENOSCOPY (EGD);  Surgeon: Theda BelfastPatrick D Hung, MD;  [facility];  Service: Endoscopy;  Laterality: N/A;  . FLEXIBLE SIGMOIDOSCOPY N/A 03/10/2017   Procedure: FLEXIBLE SIGMOIDOSCOPY;  Surgeon: Jeani HawkingHung, Patrick, MD;  Location: [facility];  Service: Endoscopy;  Laterality: N/A;  . INCISION AND DRAINAGE PERIRECTAL ABSCESS N/A 03/01/2017   Procedure: IRRIGATION AND DEBRIDEMENT PERIRECTAL ABSCESS;  Surgeon: Ovidio KinNewman, David, MD;  Location: [facility];  Service: General;  Laterality: N/A;  . IRRIGATION AND DEBRIDEMENT BUTTOCKS N/A 03/23/2017   Procedure: IRRIGATION AND DEBRIDEMENT BUTTOCKS, SETON PLACEMENT;  Surgeon: Romie Leveehomas, Isaiah, MD;  Location: [facility];  Service: General;  Laterality: N/A;  . LAPAROSCOPY  11/23/2011  Procedure: LAPAROSCOPY DIAGNOSTIC;  Surgeon: Mariella Saa, MD;  Location: [facility];  Service: General;  Laterality: N/A;  . SIGMOIDOSCOPY    . UPPER GASTROINTESTINAL ENDOSCOPY    . WISDOM TOOTH EXTRACTION       OB History    Gravida  1   Para  1   Term      Preterm  1   AB       Living  1     SAB      TAB      Ectopic      Multiple  0   Live Births  1            Home Medications    Prior to Admission medications   Medication Sig Start Date End Date Taking? Authorizing Provider  diclofenac (VOLTAREN) 75 MG EC tablet Take 1 tablet (75 mg total) by mouth daily. 09/21/18   Glade Lloyd, MD  dicyclomine (BENTYL) 20 MG tablet Take 1 tablet (20 mg total) by mouth 4 (four) times daily -  before meals and at bedtime. 09/21/18   Glade Lloyd, MD  hydrALAZINE (APRESOLINE) 50 MG tablet Take 1 tablet (50 mg total) by mouth every 6 (six) hours. 10/23/18   Swayze, Ava, DO  insulin lispro (HUMALOG) 100 UNIT/ML injection Inject 0-0.1 mLs (0-10 Units total) into the skin See admin instructions. Inject 0-10 units subcutaneously three times daily with meals per sliding scale 09/21/18   Glade Lloyd, MD  LANTUS 100 UNIT/ML injection Inject 0.08 mLs (8 Units total) into the skin at bedtime. Patient using Vials 09/21/18   Glade Lloyd, MD  LORazepam (ATIVAN) 0.5 MG tablet Take 1 tablet (0.5 mg total) by mouth 2 (two) times daily as needed for anxiety. 10/28/18   Ardith Dark, MD  metoCLOPramide (REGLAN) 10 MG tablet Take 1 tablet (10 mg total) by mouth every 6 (six) hours as needed for nausea or vomiting. 02/20/18   Burnadette Pop, MD  metoprolol tartrate (LOPRESSOR) 25 MG tablet Take 1 tablet (25 mg total) by mouth 2 (two) times daily. 10/23/18   Swayze, Ava, DO  mirtazapine (REMERON) 30 MG tablet Take 1 tablet (30 mg total) by mouth at bedtime. 05/31/18   Ardith Dark, MD  ondansetron (ZOFRAN) 4 MG tablet Take 1 tablet (4 mg total) by mouth every 8 (eight) hours as needed for nausea or vomiting (If necessary, dont combine with Reglan.). 09/21/18   Glade Lloyd, MD  pantoprazole (PROTONIX) 40 MG tablet TAKE 1 TABLET BY MOUTH  DAILY 10/22/18   Armbruster, Willaim Rayas, MD  potassium chloride 20 MEQ TBCR Take 20 mEq by mouth 2 (two) times daily. 10/17/18   Jacalyn Lefevre, MD   promethazine (PHENERGAN) 12.5 MG tablet TAKE 1 TABLET BY MOUTH  EVERY 8 HOURS AS NEEDED FOR NAUSEA AND VOMITING Patient taking differently: Take 12.5 mg by mouth every 8 (eight) hours as needed for nausea or vomiting.  08/11/18   Armbruster, Willaim Rayas, MD  promethazine (PHENERGAN) 25 MG suppository Place 1 suppository (25 mg total) rectally every 8 (eight) hours as needed for nausea or vomiting. 10/17/18   Jacalyn Lefevre, MD  traMADol (ULTRAM) 50 MG tablet Take 1 tablet (50 mg total) by mouth every 6 (six) hours as needed for moderate pain or severe pain. 09/21/18   Glade Lloyd, MD    Family History Family History  Problem Relation Age of Onset  . Diabetes Mother   . Hypertension Father   . Colon cancer Paternal  Grandmother        pt thinks PGM was dx in her 66's  . Diabetes Paternal Grandmother   . Diabetes Maternal Grandmother   . Diabetes Maternal Grandfather   . Diabetes Paternal Grandfather   . Diabetes Other   . Esophageal cancer Neg Hx   . Liver cancer Neg Hx   . Pancreatic cancer Neg Hx   . Stomach cancer Neg Hx   . Rectal cancer Neg Hx     Social History Social History   Tobacco Use  . Smoking status: Never Smoker  . Smokeless tobacco: Never Used  Substance Use Topics  . Alcohol use: No    Alcohol/week: 0.0 standard drinks    Frequency: Never  . Drug use: No     Allergies   Other and Lactose intolerance (gi)   Review of Systems Review of Systems All other systems negative except as documented in the HPI. All pertinent positives and negatives as reviewed in the HPI.  Physical Exam Updated Vital Signs BP (!) 168/119   Pulse (!) 113   Temp (!) 97.5 F (36.4 C) (Oral)   Resp 14   Ht 5\' 7"  (1.702 m)   Wt 59 kg   SpO2 100%   BMI 20.36 kg/m   Physical Exam Vitals signs and nursing note reviewed.  Constitutional:      General: She is not in acute distress.    Appearance: She is well-developed.  HENT:     Head: Normocephalic and atraumatic.  Eyes:      Pupils: Pupils are equal, round, and reactive to light.  Neck:     Musculoskeletal: Normal range of motion and neck supple.  Cardiovascular:     Rate and Rhythm: Normal rate and regular rhythm.     Heart sounds: Normal heart sounds. No murmur. No friction rub. No gallop.   Pulmonary:     Effort: Pulmonary effort is normal. No respiratory distress.     Breath sounds: Normal breath sounds. No wheezing.  Abdominal:     General: Bowel sounds are normal. There is no distension.     Palpations: Abdomen is soft.     Tenderness: There is abdominal tenderness.  Skin:    General: Skin is warm and dry.     Capillary Refill: Capillary refill takes less than 2 seconds.     Findings: No erythema or rash.  Neurological:     Mental Status: She is alert and oriented to person, place, and time.     Motor: No abnormal muscle tone.     Coordination: Coordination normal.  Psychiatric:        Behavior: Behavior normal.      ED Treatments / Results  Labs (all labs ordered are listed, but only abnormal results are displayed) Labs Reviewed  BASIC METABOLIC PANEL - Abnormal; Notable for the following components:      Result Value   Chloride 90 (*)    Glucose, Bld 454 (*)    All other components within normal limits  CBC - Abnormal; Notable for the following components:   WBC 3.9 (*)    Hemoglobin 10.9 (*)    HCT 34.0 (*)    All other components within normal limits  URINALYSIS, ROUTINE W REFLEX MICROSCOPIC - Abnormal; Notable for the following components:   Color, Urine STRAW (*)    Glucose, UA >=500 (*)    Hgb urine dipstick SMALL (*)    Ketones, ur 5 (*)    Protein, ur 30 (*)  Leukocytes,Ua TRACE (*)    Bacteria, UA RARE (*)    All other components within normal limits  CBG MONITORING, ED - Abnormal; Notable for the following components:   Glucose-Capillary 436 (*)    All other components within normal limits  LIPASE, BLOOD  I-STAT BETA HCG BLOOD, ED ([facility], [facility], [facility] ONLY)  TROPONIN I  (HIGH SENSITIVITY)  TROPONIN I (HIGH SENSITIVITY)    EKG None  Radiology Dg Chest 2 View  Result Date: 11/01/2018 CLINICAL DATA:  Chest pain EXAM: CHEST - 2 VIEW COMPARISON:  October 19, 2018 FINDINGS: Lungs are clear. Heart size and pulmonary vascularity are normal. No adenopathy. No pneumothorax. No bone lesions. IMPRESSION: No edema or consolidation. Electronically Signed   By: Bretta Bang III M.D.   On: 11/01/2018 13:48   Ct Head Wo Contrast  Result Date: 11/01/2018 CLINICAL DATA:  Head trauma EXAM: CT HEAD WITHOUT CONTRAST TECHNIQUE: Contiguous axial images were obtained from the base of the skull through the vertex without intravenous contrast. COMPARISON:  None. FINDINGS: Brain: No evidence of acute territorial infarction, hemorrhage, hydrocephalus,extra-axial collection or mass lesion/mass effect. Normal gray-white differentiation. Ventricles are normal in size and contour. Vascular: No hyperdense vessel or unexpected calcification. Skull: The skull is intact. No fracture or focal lesion identified. Sinuses/Orbits: The visualized paranasal sinuses and mastoid air cells are clear. The orbits and globes intact. Other: None IMPRESSION: No acute intracranial abnormality. Electronically Signed   By: Jonna Clark M.D.   On: 11/01/2018 16:49    Procedures Procedures (including critical care time)  Medications Ordered in ED Medications  sodium chloride flush (NS) 0.9 % injection 3 mL (3 mLs Intravenous Given 11/01/18 1248)  promethazine (PHENERGAN) injection 25 mg (25 mg Intravenous Given 11/01/18 1424)  sodium chloride 0.9 % bolus 2,000 mL (2,000 mLs Intravenous New Bag/Given 11/01/18 1428)  LORazepam (ATIVAN) injection 1 mg (1 mg Intravenous Given 11/01/18 1424)  fentaNYL (SUBLIMAZE) injection 100 mcg (100 mcg Intravenous Given 11/01/18 1424)  naloxone (NARCAN) injection 0.4 mg (0.4 mg Intravenous Given 11/01/18 1448)  naloxone (NARCAN) 2 MG/2ML injection (2 mg  Given 11/01/18 1447)     Initial  Impression / Assessment and Plan / ED Course  I have reviewed the triage vital signs and the nursing notes.  Pertinent labs & imaging results that were available during my care of the patient were reviewed by me and considered in my medical decision making (see chart for details).        Patient had an episode where she was found on the floor and her breathing seemed to be diminished.  I observe the patient 5 minutes for this walking to the bathroom with no difficulties.  The patient was given Narcan and she immediately awoke woke and was breathing normally.  The patient has been stable and ambulating without difficulty since that time.  I feel that there may have been some other factors involved in this episode.  The medicines we gave I do not feel would cause this type of diminished condition based on the fact the patient has been here many times in the past and received multiple medicines at stronger levels than what was given today.  The patient responded very quickly to the Narcan which indicates that this was some sort of opiate that was ingested in addition to our medications I feel.  The patient again has been alert and oriented x3 since the episode and I feel that she will be safe to be discharged home at this  time. Final Clinical Impressions(s) / ED Diagnoses   Final diagnoses:  None    ED Discharge Orders    None       Charlestine NightLawyer, Mariya Mottley, PA-C 11/01/18 1704    Milagros Lollykstra, Richard S, MD 11/01/18 [phone]

## 2018-11-01 NOTE — ED Notes (Addendum)
Post medication administration and prior to finding patient on the floor, Tech and PA saw the patient walking from the bathroom back to her room with not difficulty or in no distress.

## 2018-11-01 NOTE — ED Notes (Signed)
   11/01/18 1532  What Happened  Was fall witnessed? No  Was patient injured? Unsure  Patient found on floor  Found by Staff-comment  Stated prior activity other (comment) (unknown)  Follow Up  MD notified Lawyer, [place]  Time MD notified [phone]  Additional tests Yes-comment  Progress note created (see row info) Yes  Adult Fall Risk Assessment  Risk Factor Category (scoring not indicated) High fall risk per protocol (document High fall risk)  Age 28  Fall History: Fall within 6 months prior to admission 0  Elimination; Bowel and/or Urine Incontinence 0  Elimination; Bowel and/or Urine Urgency/Frequency 0  Medications: includes PCA/Opiates, Anti-convulsants, Anti-hypertensives, Diuretics, Hypnotics, Laxatives, Sedatives, and Psychotropics 5  Patient Care Equipment 1  Mobility-Assistance 0  Mobility-Gait 0  Mobility-Sensory Deficit 0  Altered awareness of immediate physical environment 0  Impulsiveness 0  Lack of understanding of one's physical/cognitive limitations 0  Total Score 6  Patient Fall Risk Level High fall risk  Adult Fall Risk Interventions  Required Bundle Interventions *See Row Information* High fall risk - low, moderate, and high requirements implemented  Additional Interventions Use of appropriate toileting equipment (bedpan, BSC, etc.)  Pain Assessment  Pain Score 0  Neurological  Neuro (WDL) WDL  Musculoskeletal  Musculoskeletal (WDL) WDL  Integumentary  Integumentary (WDL) WDL

## 2018-11-01 NOTE — ED Notes (Signed)
Patient was found by a staff member on the floor at 1440. Staff member noticed she had decreased saturations. Upon checking on the patient she was the patient was laying in the floor grunting with a pulse. More staff members including provider, came in to assess the situation. The patient was not responding to voice or sternal rub.  Patient was given narcan 1447 and is now alert and oriented x4 at 1448. Patient denies pain at present.

## 2018-11-01 NOTE — ED Triage Notes (Signed)
Pt complaint of n/v/d and chest/back pain consistent with her DM I/hyperglycemia.

## 2018-11-01 NOTE — ED Notes (Signed)
RN gave patient warm blankets and explained to patient that the provider would be with her shortly

## 2018-11-02 ENCOUNTER — Other Ambulatory Visit: Payer: 59 | Admitting: *Deleted

## 2018-11-02 NOTE — Patient Outreach (Signed)
[place] [facility]) Care Management  09/01/1495  [name] [birth year] [phone]  3rd outreach call-unsuccessful  RN attempted to reach pt post ED visit however this is the 3rd outreach over the last week with a outreach letter sent. No response however was able to leave a HIPAA approved voice message requesting a call back. Will further engage at that time.   Will allow time for pt to respond to the outreach calls and letter prior to closing this case per policy next week.  Raina Mina, RN Care Management Coordinator [place] Office [phone]

## 2018-11-03 ENCOUNTER — Other Ambulatory Visit: Payer: Self-pay

## 2018-11-03 ENCOUNTER — Emergency Department (HOSPITAL_COMMUNITY): Payer: 59

## 2018-11-03 ENCOUNTER — Encounter (HOSPITAL_COMMUNITY): Payer: Self-pay | Admitting: Emergency Medicine

## 2018-11-03 ENCOUNTER — Emergency Department (HOSPITAL_COMMUNITY)
Admission: EM | Admit: 2018-11-03 | Discharge: 2018-11-03 | Disposition: A | Discharge status: Home / Self Care | Payer: 59 | Attending: Emergency Medicine | Admitting: Emergency Medicine

## 2018-11-03 DIAGNOSIS — R079 Chest pain, unspecified: Secondary | ICD-10-CM | POA: Insufficient documentation

## 2018-11-03 DIAGNOSIS — M549 Dorsalgia, unspecified: Secondary | ICD-10-CM | POA: Insufficient documentation

## 2018-11-03 DIAGNOSIS — R112 Nausea with vomiting, unspecified: Secondary | ICD-10-CM | POA: Insufficient documentation

## 2018-11-03 DIAGNOSIS — R197 Diarrhea, unspecified: Secondary | ICD-10-CM | POA: Insufficient documentation

## 2018-11-03 DIAGNOSIS — E876 Hypokalemia: Secondary | ICD-10-CM

## 2018-11-03 DIAGNOSIS — G8929 Other chronic pain: Secondary | ICD-10-CM | POA: Diagnosis not present

## 2018-11-03 DIAGNOSIS — Z79899 Other long term (current) drug therapy: Secondary | ICD-10-CM | POA: Insufficient documentation

## 2018-11-03 DIAGNOSIS — I10 Essential (primary) hypertension: Secondary | ICD-10-CM | POA: Diagnosis not present

## 2018-11-03 DIAGNOSIS — Z794 Long term (current) use of insulin: Secondary | ICD-10-CM | POA: Diagnosis not present

## 2018-11-03 DIAGNOSIS — R109 Unspecified abdominal pain: Secondary | ICD-10-CM | POA: Diagnosis not present

## 2018-11-03 DIAGNOSIS — E109 Type 1 diabetes mellitus without complications: Secondary | ICD-10-CM | POA: Diagnosis not present

## 2018-11-03 LAB — CBC
HCT: 29.4 % — ABNORMAL LOW (ref 36.0–46.0)
Hemoglobin: 9.6 g/dL — ABNORMAL LOW (ref 12.0–15.0)
MCH: 28.3 pg (ref 26.0–34.0)
MCHC: 32.7 g/dL (ref 30.0–36.0)
MCV: 86.7 fL (ref 80.0–100.0)
Platelets: 145 10*3/uL — ABNORMAL LOW (ref 150–400)
RBC: 3.39 MIL/uL — ABNORMAL LOW (ref 3.87–5.11)
RDW: 12.9 % (ref 11.5–15.5)
WBC: 4.2 10*3/uL (ref 4.0–10.5)
nRBC: 0 % (ref 0.0–0.2)

## 2018-11-03 LAB — URINALYSIS, ROUTINE W REFLEX MICROSCOPIC
Bilirubin Urine: NEGATIVE
Glucose, UA: 50 mg/dL — AB
Hgb urine dipstick: NEGATIVE
Ketones, ur: NEGATIVE mg/dL
Nitrite: NEGATIVE
Protein, ur: 100 mg/dL — AB
Specific Gravity, Urine: 1.008 (ref 1.005–1.030)
pH: 7 (ref 5.0–8.0)

## 2018-11-03 LAB — RAPID URINE DRUG SCREEN, HOSP PERFORMED
Amphetamines: NOT DETECTED
Barbiturates: NOT DETECTED
Benzodiazepines: NOT DETECTED
Cocaine: NOT DETECTED
Opiates: NOT DETECTED
Tetrahydrocannabinol: POSITIVE — AB

## 2018-11-03 LAB — LIPASE, BLOOD: Lipase: 19 U/L (ref 11–51)

## 2018-11-03 LAB — HEPATIC FUNCTION PANEL
ALT: 25 U/L (ref 0–44)
AST: 31 U/L (ref 15–41)
Albumin: 3.3 g/dL — ABNORMAL LOW (ref 3.5–5.0)
Alkaline Phosphatase: 59 U/L (ref 38–126)
Bilirubin, Direct: 0.1 mg/dL (ref 0.0–0.2)
Indirect Bilirubin: 0.2 mg/dL — ABNORMAL LOW (ref 0.3–0.9)
Total Bilirubin: 0.3 mg/dL (ref 0.3–1.2)
Total Protein: 6.5 g/dL (ref 6.5–8.1)

## 2018-11-03 LAB — BASIC METABOLIC PANEL
Anion gap: 16 — ABNORMAL HIGH (ref 5–15)
BUN: 20 mg/dL (ref 6–20)
CO2: 29 mmol/L (ref 22–32)
Calcium: 8.5 mg/dL — ABNORMAL LOW (ref 8.9–10.3)
Chloride: 94 mmol/L — ABNORMAL LOW (ref 98–111)
Creatinine, Ser: 0.88 mg/dL (ref 0.44–1.00)
GFR calc Af Amer: >60 mL/min (ref >60)
GFR calc non Af Amer: >60 mL/min (ref >60)
Glucose, Bld: 213 mg/dL — ABNORMAL HIGH (ref 70–99)
Potassium: 2.6 mmol/L — CL (ref 3.5–5.1)
Sodium: 139 mmol/L (ref 135–145)

## 2018-11-03 LAB — GLUCOSE, CAPILLARY: Glucose-Capillary: 215 mg/dL — ABNORMAL HIGH (ref 70–99)

## 2018-11-03 LAB — HCG, QUANTITATIVE, PREGNANCY: hCG, Beta Chain, Quant, S: <1 m[IU]/mL (ref <5)

## 2018-11-03 LAB — TROPONIN I (HIGH SENSITIVITY): Troponin I (High Sensitivity): <2 ng/L (ref <18)

## 2018-11-03 IMAGING — CR CHEST - 2 VIEW
2 series · 2 of 2 positions shown · non-contrast
Comparison: [DATE]

CLINICAL DATA: Chest pain

EXAM:
CHEST - 2 VIEW

[w chest pa]
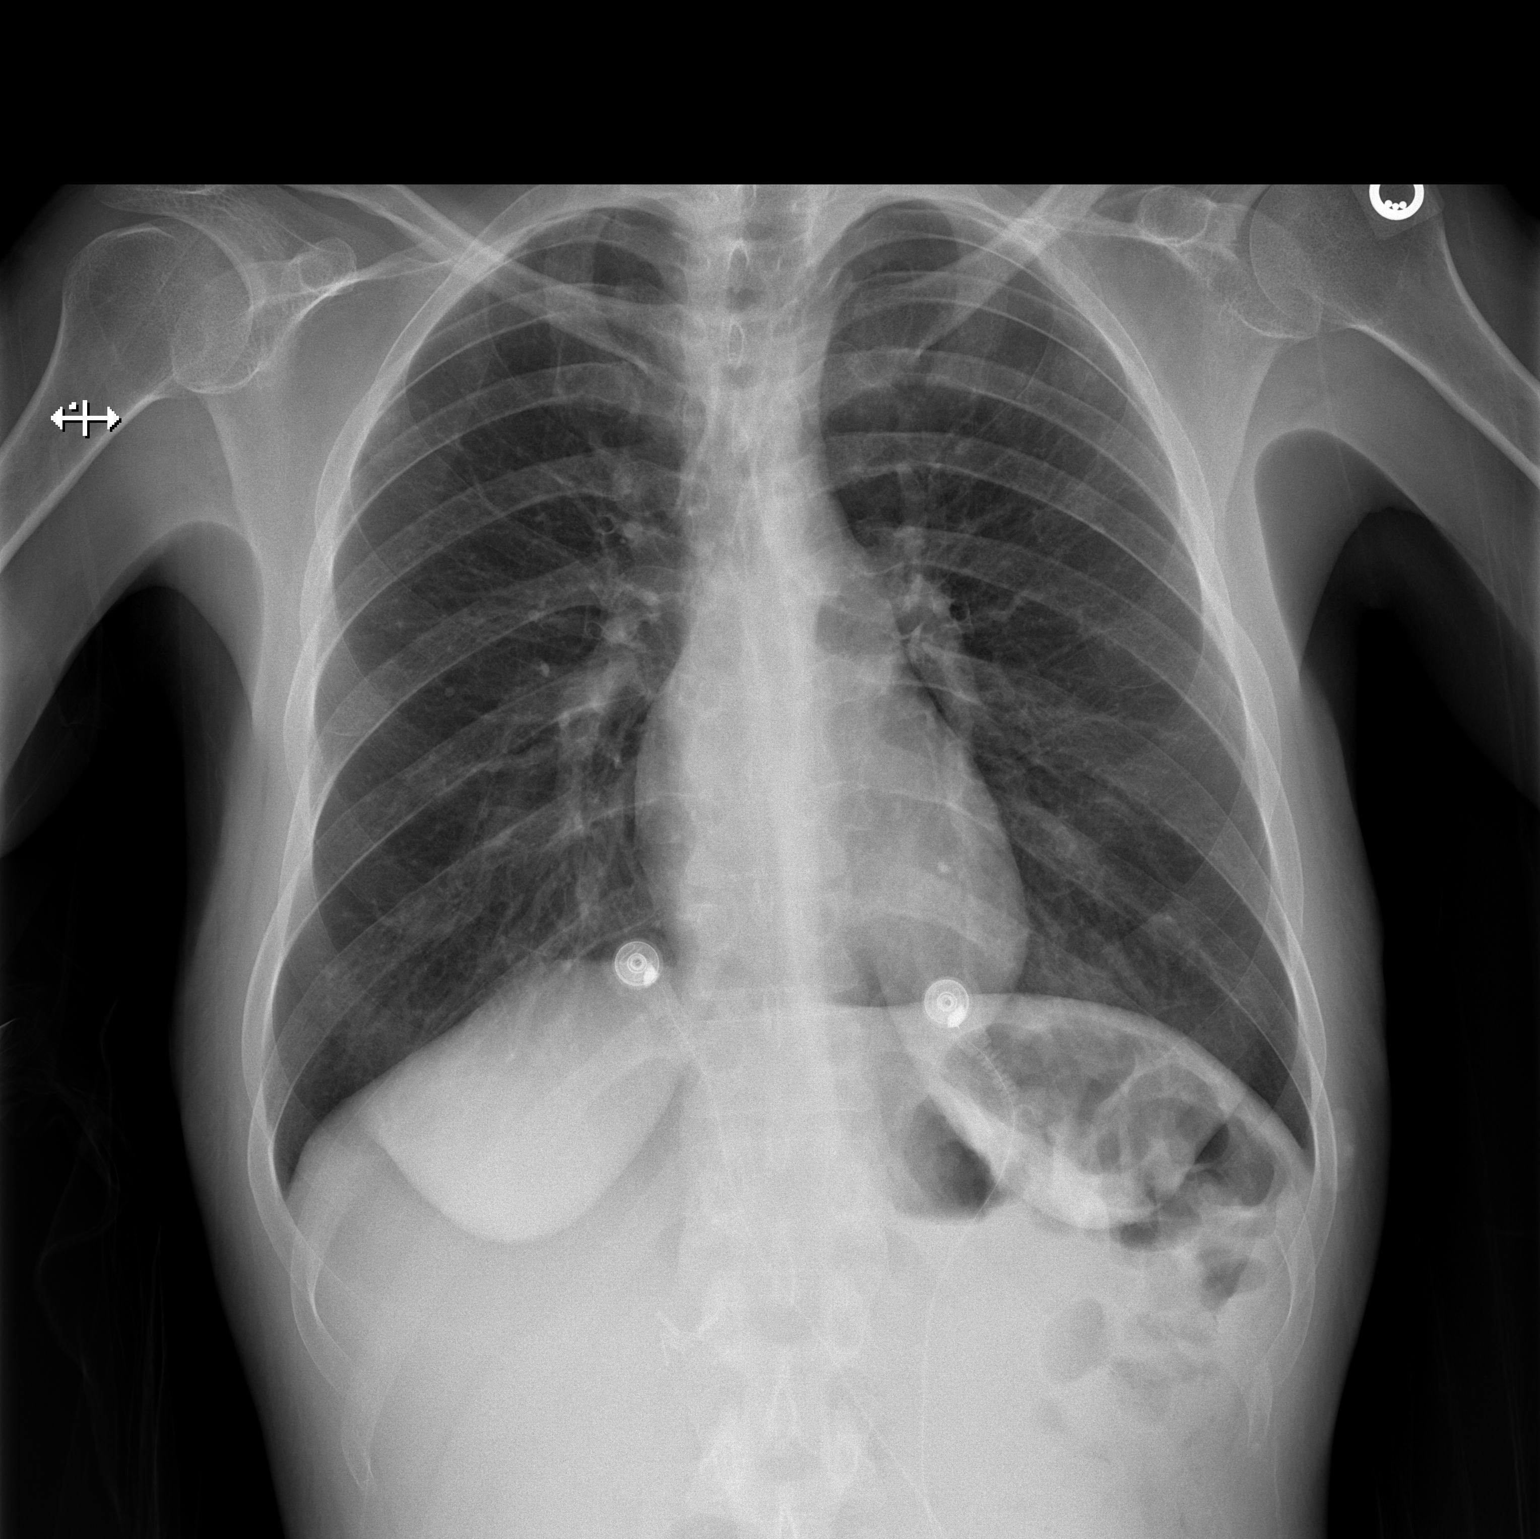

[w chest lat]
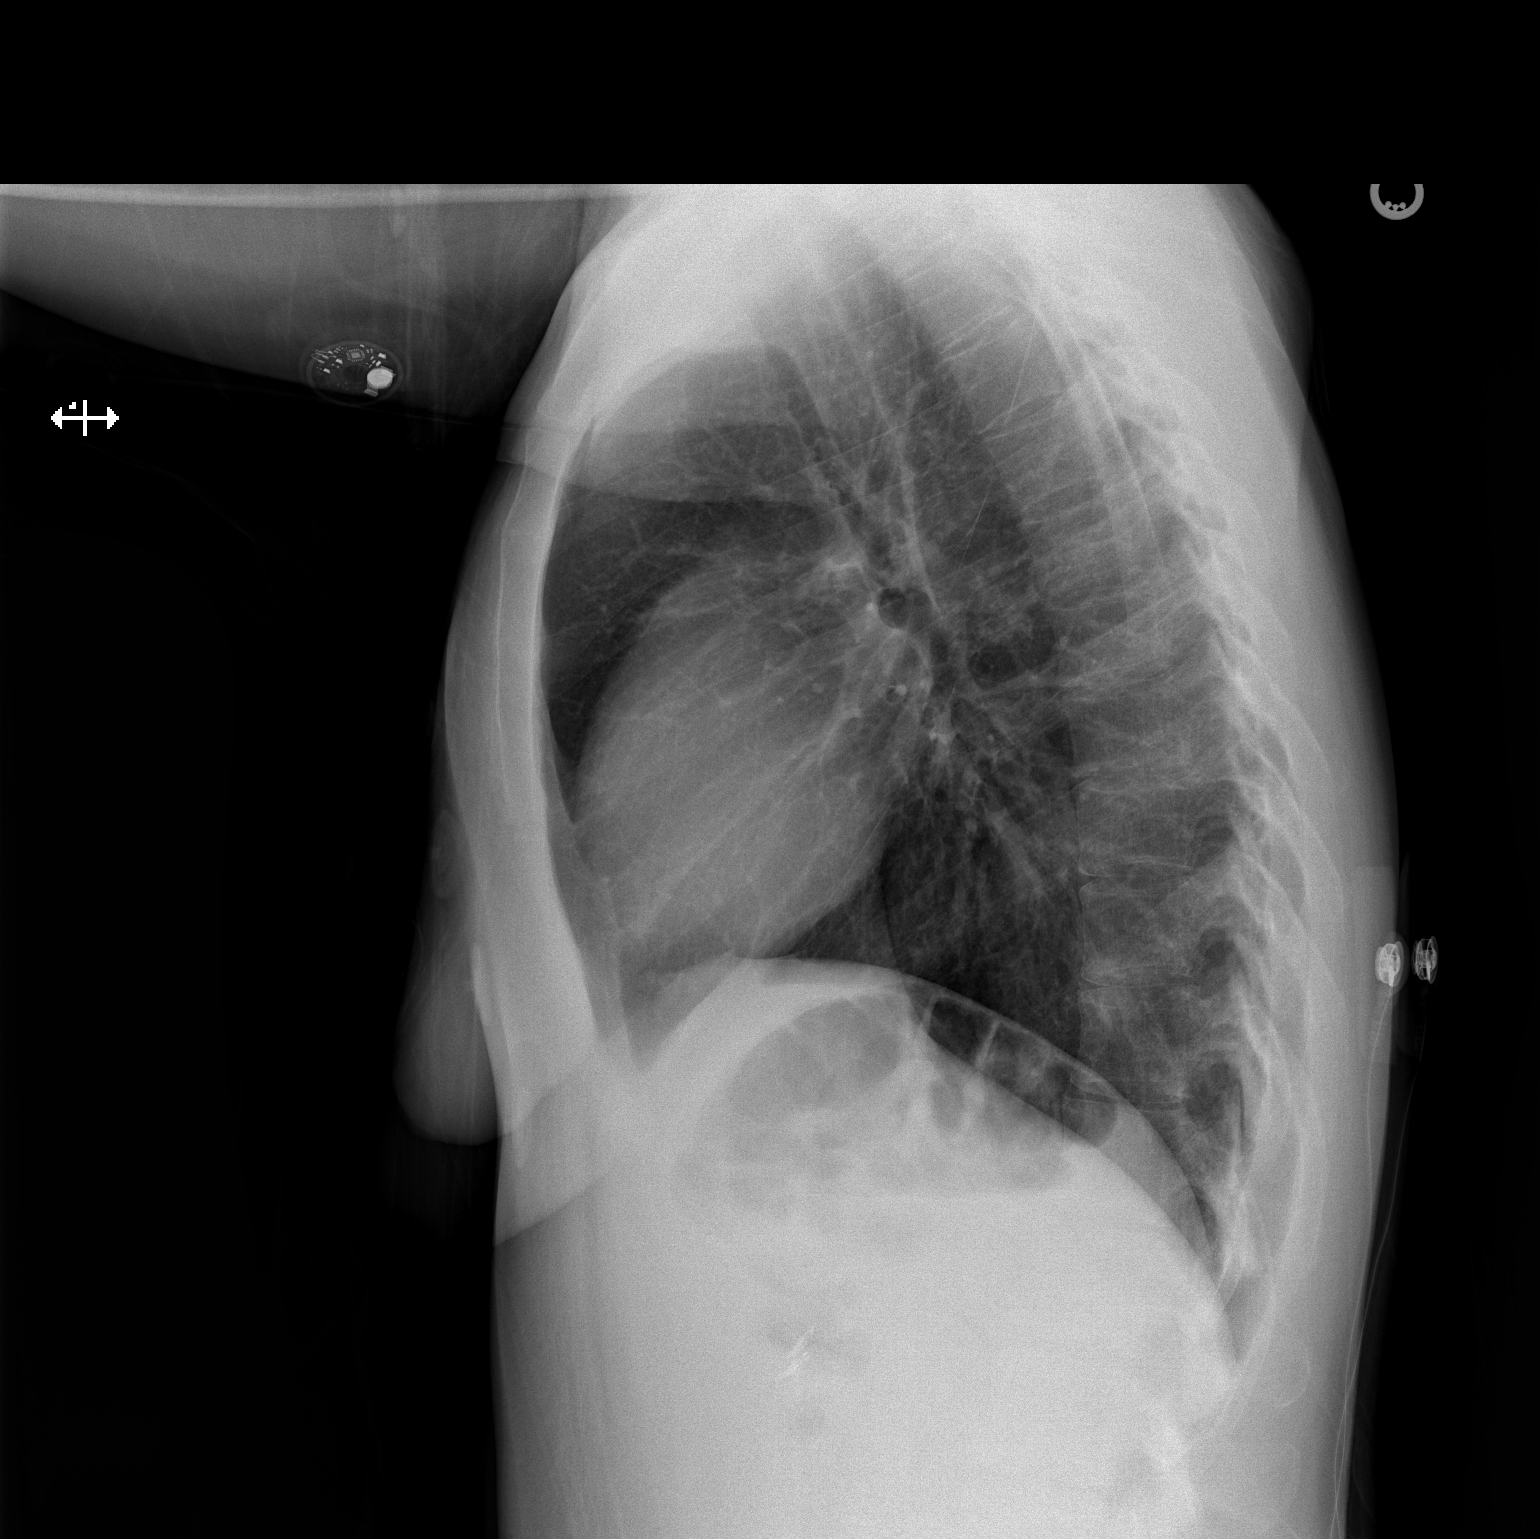

[2 of 2 positions shown; findings below may reference images not displayed]

FINDINGS: The heart size and mediastinal contours are within normal limits.
Both lungs are clear. The visualized skeletal structures are
unremarkable.
IMPRESSION: Clear lungs

## 2018-11-03 MED ORDER — HALOPERIDOL LACTATE 5 MG/ML IJ SOLN
2.0000 mg | Freq: Once | INTRAMUSCULAR | Status: AC
  Administered 2018-11-03: 2 mg via INTRAVENOUS
  Filled 2018-11-03: qty 1

## 2018-11-03 MED ORDER — METOPROLOL TARTRATE 25 MG PO TABS
25.0000 mg | ORAL_TABLET | Freq: Once | ORAL | Status: AC
  Administered 2018-11-03: 25 mg via ORAL
  Filled 2018-11-03: qty 1

## 2018-11-03 MED ORDER — SODIUM CHLORIDE 0.9% FLUSH
3.0000 mL | Freq: Once | INTRAVENOUS | Status: AC
  Administered 2018-11-03: 3 mL via INTRAVENOUS

## 2018-11-03 MED ORDER — LORAZEPAM 2 MG/ML IJ SOLN: 1.0000 mg | Freq: Once | INTRAMUSCULAR | Status: DC

## 2018-11-03 MED ORDER — LORAZEPAM 2 MG/ML IJ SOLN
1.0000 mg | Freq: Once | INTRAMUSCULAR | Status: AC
  Administered 2018-11-03: 1 mg via INTRAVENOUS
  Filled 2018-11-03: qty 1

## 2018-11-03 MED ORDER — SODIUM CHLORIDE 0.9 % IV BOLUS (SEPSIS)
1000.0000 mL | Freq: Once | INTRAVENOUS | Status: AC
  Administered 2018-11-03: 1000 mL via INTRAVENOUS

## 2018-11-03 MED ORDER — POTASSIUM CHLORIDE CRYS ER 20 MEQ PO TBCR
40.0000 meq | EXTENDED_RELEASE_TABLET | Freq: Once | ORAL | Status: AC
  Administered 2018-11-03: 40 meq via ORAL
  Filled 2018-11-03: qty 2

## 2018-11-03 MED ORDER — HYDRALAZINE HCL 50 MG PO TABS
50.0000 mg | ORAL_TABLET | Freq: Once | ORAL | Status: AC
  Administered 2018-11-03: 50 mg via ORAL
  Filled 2018-11-03: qty 1

## 2018-11-03 NOTE — ED Notes (Signed)
Pt to the nurse's station asking about IV team Explained to pt that there is a wait because the IV team RN is on the floor Pt asked if there is someone that can make an attempt at drawing labs and starting IV Informed pt that I would let Jenel Lucks, RN know

## 2018-11-03 NOTE — ED Notes (Signed)
Called Phlebotomy for blood draws. Several unsuccessful attempts prior, IV team also unsuccessful.

## 2018-11-03 NOTE — ED Triage Notes (Signed)
Patient here from home with complaints of chest pain and back pain with nausea, vomiting. Hx of same. Pain 10/10. Patient crying in triage.

## 2018-11-03 NOTE — ED Provider Notes (Signed)
Patient's potassium came in a critically low level.  But she has had no further vomiting.  She is up and about and very functional.  She wants to go home.  Earlier patient given 40 mg of potassium p.o. which was she was able to keep down.  Patient had repeat potassium 40 mEq here prior to discharge.  She has potassium at home.  Patient did not want IV potassium.  Patient also had marginal IV.  Patient has oral potassium at home she is known to have low potassium.  Recommending 20 mEq potassium daily for the next 7 days and then follow-up with her doctors.  She will return for any new or worse symptoms.  Patient currently nontoxic no acute distress.   Fredia Sorrow, MD 11/03/18 1318

## 2018-11-03 NOTE — ED Notes (Signed)
Pt in room pacing back and forth Unable to keep pt on the monitor

## 2018-11-03 NOTE — ED Notes (Signed)
Order: Saline Lock, clicked off as completed in error by this Probation officer

## 2018-11-03 NOTE — Discharge Instructions (Addendum)
Take your potassium at home for the next several days.  Would recommend 20 mg once a day for the next 7 days.  Return for any new or worse symptoms.

## 2018-11-03 NOTE — ED Provider Notes (Signed)
TIME SEEN: 3:59 AM  CHIEF COMPLAINT: Abdominal pain, chest pain, back pain, nausea, vomiting, diarrhea  HPI: Patient is a 28 year old female with history of fibromyalgia, gastroparesis, type 1 diabetes, hypertension who presents to the emergency department today complaining of several days of chest pain, back pain, diffuse abdominal pain, nausea, vomiting and diarrhea.  Unable to describe the pain other than stating "it hurts".  Patient is a very poor historian.  Denies fevers, cough.  No dysuria, vaginal bleeding or discharge.  Has irregular menstrual cycles.  Requesting IV Dilaudid and Ativan.  States her blood sugars have been "high".  She states she is not taking her medication because she cannot eat.  ROS: See HPI Constitutional: no fever  Eyes: no drainage  ENT: no runny nose   Cardiovascular:   chest pain  Resp: no SOB  GI: Diarrhea and vomiting GU: no dysuria Integumentary: no rash  Allergy: no hives  Musculoskeletal: no leg swelling  Neurological: no slurred speech ROS otherwise negative  PAST MEDICAL HISTORY/PAST SURGICAL HISTORY:  Past Medical History:  Diagnosis Date  . Allergy   . Anemia   . Anxiety   . Blood transfusion without reported diagnosis    Dec 2018  . Cataract    right eye  . Depression   . Diabetes type 1, uncontrolled ([place]) 11/14/2011   Since age 27  . Fibromyalgia   . Gastroparesis   . GERD (gastroesophageal reflux disease)   . Hypertension   . Infection    UTI April 2016    MEDICATIONS:  Prior to Admission medications   Medication Sig Start Date End Date Taking? Authorizing Provider  diclofenac (VOLTAREN) 75 MG EC tablet Take 1 tablet (75 mg total) by mouth daily. 09/21/18   Aline August, MD  dicyclomine (BENTYL) 20 MG tablet Take 1 tablet (20 mg total) by mouth 4 (four) times daily -  before meals and at bedtime. 09/21/18   Aline August, MD  hydrALAZINE (APRESOLINE) 50 MG tablet Take 1 tablet (50 mg total) by mouth every 6 (six) hours.  10/23/18   Swayze, Ava, DO  insulin lispro (HUMALOG) 100 UNIT/ML injection Inject 0-0.1 mLs (0-10 Units total) into the skin See admin instructions. Inject 0-10 units subcutaneously three times daily with meals per sliding scale 09/21/18   Aline August, MD  LANTUS 100 UNIT/ML injection Inject 0.08 mLs (8 Units total) into the skin at bedtime. Patient using Vials 09/21/18   Aline August, MD  LORazepam (ATIVAN) 0.5 MG tablet Take 1 tablet (0.5 mg total) by mouth 2 (two) times daily as needed for anxiety. 10/28/18   [name], MD  metoCLOPramide (REGLAN) 10 MG tablet Take 1 tablet (10 mg total) by mouth every 6 (six) hours as needed for nausea or vomiting. 02/20/18   Shelly Coss, MD  metoprolol tartrate (LOPRESSOR) 25 MG tablet Take 1 tablet (25 mg total) by mouth 2 (two) times daily. 10/23/18   Swayze, Ava, DO  mirtazapine (REMERON) 30 MG tablet Take 1 tablet (30 mg total) by mouth at bedtime. 05/31/18   [name], MD  ondansetron (ZOFRAN) 4 MG tablet Take 1 tablet (4 mg total) by mouth every 8 (eight) hours as needed for nausea or vomiting (If necessary, dont combine with Reglan.). 09/21/18   Aline August, MD  pantoprazole (PROTONIX) 40 MG tablet TAKE 1 TABLET BY MOUTH  DAILY 10/22/18   Armbruster, Carlota Raspberry, MD  potassium chloride 20 MEQ TBCR Take 20 mEq by mouth 2 (two) times daily. 10/17/18  Isla Pence, MD  promethazine (PHENERGAN) 12.5 MG tablet TAKE 1 TABLET BY MOUTH  EVERY 8 HOURS AS NEEDED FOR NAUSEA AND VOMITING Patient taking differently: Take 12.5 mg by mouth every 8 (eight) hours as needed for nausea or vomiting.  08/11/18   Armbruster, Carlota Raspberry, MD  promethazine (PHENERGAN) 25 MG suppository Place 1 suppository (25 mg total) rectally every 8 (eight) hours as needed for nausea or vomiting. 10/17/18   Isla Pence, MD  traMADol (ULTRAM) 50 MG tablet Take 1 tablet (50 mg total) by mouth every 6 (six) hours as needed for moderate pain or severe pain. 09/21/18   Aline August, MD     ALLERGIES:  Allergies  Allergen Reactions  . Other Anaphylaxis    Reaction to [place] nuts   . Lactose Intolerance (Gi) Diarrhea    SOCIAL HISTORY:  Social History   Tobacco Use  . Smoking status: Never Smoker  . Smokeless tobacco: Never Used  Substance Use Topics  . Alcohol use: No    Alcohol/week: 0.0 standard drinks    Frequency: Never    FAMILY HISTORY: Family History  Problem Relation Age of Onset  . Diabetes Mother   . Hypertension Father   . Colon cancer Paternal Grandmother        pt thinks PGM was dx in her 65's  . Diabetes Paternal Grandmother   . Diabetes Maternal Grandmother   . Diabetes Maternal Grandfather   . Diabetes Paternal Grandfather   . Diabetes Other   . Esophageal cancer Neg Hx   . Liver cancer Neg Hx   . Pancreatic cancer Neg Hx   . Stomach cancer Neg Hx   . Rectal cancer Neg Hx     EXAM: BP (!) 159/121 (BP Location: Right Arm)   Pulse (!) 101   Temp 98.2 F (36.8 C) (Oral)   Resp (!) 23   Ht 5' 7"  (1.702 m)   Wt 54.4 kg   SpO2 100%   BMI 18.79 kg/m  CONSTITUTIONAL: Alert and oriented and responds appropriately to questions.  Chronically ill-appearing, thin HEAD: Normocephalic EYES: Conjunctivae clear, pupils appear equal, EOMI ENT: normal nose; moist mucous membranes NECK: Supple, no meningismus, no nuchal rigidity, no LAD  CARD: Regular and tachycardic; S1 and S2 appreciated; no murmurs, no clicks, no rubs, no gallops RESP: Normal chest excursion without splinting or tachypnea; breath sounds clear and equal bilaterally; no wheezes, no rhonchi, no rales, no hypoxia or respiratory distress, speaking full sentences ABD/GI: Normal bowel sounds; non-distended; soft, diffusely tender throughout the abdomen without guarding or rebound BACK:  The back appears normal and is non-tender to palpation, there is no CVA tenderness EXT: Normal ROM in all joints; non-tender to palpation; no edema; normal capillary refill; no cyanosis, no calf  tenderness or swelling    SKIN: Normal color for age and race; warm; no rash NEURO: Moves all extremities equally PSYCH: The patient's mood and manner are appropriate. Grooming and personal hygiene are appropriate.  MEDICAL DECISION MAKING: Patient here with complaints of chest pain, back pain, abdominal pain, nausea, vomiting and diarrhea.  She reports not taking her insulin or blood pressure medications because she is unable to eat.  She states her blood sugars have been running high.  Blood sugar here is in the 200s.  EKG shows no ischemic abnormality.  Chest x-ray clear.  Urine shows no ketones and no sign of infection.  Her drug screen is positive for marijuana.  Symptoms may be related to gastroparesis versus  cyclic vomiting from marijuana use.  I do not feel she needs imaging of her abdomen today.  I do not think that she is in DKA.  Will check labs.  Will give home blood pressure medications, IV fluids, Haldol, Ativan for symptomatic relief.  Low suspicion for any surgical process in her abdomen including appendicitis, cholecystitis, bowel obstruction, perforation.  ED PROGRESS: Patient still awaiting IV team for IV access at this time.  She has not had any vomiting witnessed since arriving to the emergency department.  Signed out to oncoming ED physician to follow-up on patient's labs.  I reviewed all nursing notes, vitals, pertinent previous records, EKGs, lab and urine results, imaging (as available).   EKG Interpretation  Date/Time:  Wednesday November 03 2018 02:00:05 EDT Ventricular Rate:  100 PR Interval:    QRS Duration: 80 QT Interval:  337 QTC Calculation: 435 R Axis:   78 Text Interpretation:  Sinus tachycardia No significant change since last tracing Confirmed by Fernandez Kenley, Cyril Mourning [phone]) on 11/03/2018 3:58:39 AM         Elisama Thissen, Delice Bison, DO 11/03/18 [id number]

## 2018-11-03 NOTE — ED Notes (Signed)
Pt CBG 215

## 2018-11-04 ENCOUNTER — Telehealth: Payer: Self-pay | Admitting: Gastroenterology

## 2018-11-04 ENCOUNTER — Other Ambulatory Visit: Payer: Self-pay | Admitting: *Deleted

## 2018-11-04 MED ORDER — PROMETHAZINE HCL 12.5 MG PO TABS: 12.5000 mg | ORAL_TABLET | Freq: Three times a day (TID) | ORAL | 1 refills | Status: DC | PRN

## 2018-11-04 NOTE — Telephone Encounter (Signed)
Script sent to [employer]. Pt last seen 06-2018

## 2018-11-04 NOTE — Patient Outreach (Signed)
[facility]) Care Management  0/05/1115  [name] [birth year] [id number]   Telephone Assessment-post ED visit on 8/3-Declined [facility]  RN spoke with pt and introduced the [facility] program and services. RN explained the purpose for today's call and inquired further on her recent ED visit and medical issues. Pt indicates she has gastroparesis and verifies she has a GI doctor. States she has been on medication for this issues for a long time but will contact her GI doctor and schedule an appointment to address this matter further. Pt also states she has a pending appointment with her primary provider. RN verified transportation as pt currently she has transportation to the upcoming appointments but may need further resources. RN discussed possible resources and with [employer] bus along with public transportation if she does not meet the criteria for the the other resources discussed ([employer]). Pt does not want to take a bus ride and declined the referral. States she will speak further with her spouse on the available resources. Note pt has transportation for her primary appointment coming up.   RN offered [facility] services for ongoing follow up calls and possible education on prevention measures however pt opt to decline with available support from her spouse. RN offered to send out [facility] information and to contact her provider with an update on her disposition with [facility] services at this time (pt receptive). No other resources needed at this time as case will be closed from further contact at this time. Pt grateful for the call.   Raina Mina, RN Care Management Coordinator [place] Office [phone]

## 2018-11-05 ENCOUNTER — Encounter: Payer: Self-pay | Admitting: Physician Assistant

## 2018-11-05 ENCOUNTER — Ambulatory Visit (INDEPENDENT_AMBULATORY_CARE_PROVIDER_SITE_OTHER): Payer: 59 | Admitting: Physician Assistant

## 2018-11-05 DIAGNOSIS — R1084 Generalized abdominal pain: Secondary | ICD-10-CM | POA: Diagnosis not present

## 2018-11-05 DIAGNOSIS — R42 Dizziness and giddiness: Secondary | ICD-10-CM

## 2018-11-05 DIAGNOSIS — E876 Hypokalemia: Secondary | ICD-10-CM | POA: Diagnosis not present

## 2018-11-05 NOTE — Progress Notes (Signed)
Virtual Visit via Video   I connected with [name] on 11/05/18 at  2:20 PM EDT by a video enabled telemedicine application and verified that I am speaking with the correct person using two identifiers. Location patient: Home Location provider: [facility], Office Persons participating in the virtual visit: MCKINZEY STURCH, [employer].  I discussed the limitations of evaluation and management by telemedicine and the availability of in person appointments. The patient expressed understanding and agreed to proceed.   Subjective:   HPI:   ED follow up Pt was seen in the ED 8/5 for epigastric pain, chest pain, back pain, n/v/d, and inability to take her insulin and BP meds. EKG and CXR were normal. She did have a critical potassium level but was given 2 x 40 mEq prior to discharge. She was told to take 20 mEq potassium daily for a week, she states that she is taking it as directed.   Overall, her pain is improved. She currently rates her symptoms as 5/10 on pain scale. Pt having nausea and vomiting 2-3 times a day. Drinking 16 ounces of water a day. Eating some soft food.   Blood sugar was 20 yesterday -- and she states that she is unsure how this happened and that she was home alone when it did. She states compliance with her insulin.  She has scheduled follow-up with GI in Sep.  ?Syncopal Event She is concerned because during her other ED visit on 11/01/18 she has had lightheadedness since she had a "seizure". I don't have any other documentation about this event other than a note that says the patient was found on the ER floor and breathing diminished for unknown amount of time, was given Narcan and she immediately responded. I did discuss this with her and said that if she took any narcotics it would have been what they provided in the ER.    ROS: See pertinent positives and negatives per HPI.  Patient Active Problem List   Diagnosis Date Noted  . Diabetic  gastroparesis (HCC) 10/19/2018  . DKA, type 1 (HCC) 09/18/2018  . Pneumonia 08/20/2018  . Contraception management 08/06/2018  . Intractable vomiting with nausea 07/30/2018  . Intractable vomiting   . Cyclical vomiting syndrome 06/07/2018  . Diabetic gastroparesis associated with type 1 diabetes mellitus (HCC) 06/04/2018  . Anxiety 05/31/2018  . Peripheral edema 05/31/2018  . Acute kidney injury (HCC) 03/22/2018  . Normocytic anemia 07/10/2017  . Hypokalemia 06/23/2017  . GERD (gastroesophageal reflux disease) 06/23/2017  . Intractable nausea and vomiting 04/11/2017  . 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 Diabetes mellitus type 1 (HCC) 04/28/2013  . Protein-calorie malnutrition, severe (HCC) 04/28/2013  . Hypertension associated with diabetes (HCC) 11/14/2011  . Chronic abdominal pain 09/18/2011    Social History   Tobacco Use  . Smoking status: Never Smoker  . Smokeless tobacco: Never Used  Substance Use Topics  . Alcohol use: No    Alcohol/week: 0.0 standard drinks    Frequency: Never    Current Outpatient Medications:  .  Continuous Blood Gluc Receiver (DEXCOM G6 RECEIVER) DEVI, USE AS DIRECTED TO MONITOR GLUCOSE LEVELS, Disp: , Rfl:  .  Continuous Blood Gluc Transmit (DEXCOM G6 TRANSMITTER) MISC, USE AS DIRECTED TO MONITOR GLUCOSE LEVELS, Disp: , Rfl:  .  diclofenac (VOLTAREN) 75 MG EC tablet, Take 1 tablet (75 mg total) by mouth daily., Disp: , Rfl:  .  dicyclomine (BENTYL) 20 MG tablet, Take  1 tablet (20 mg total) by mouth 4 (four) times daily -  before meals and at bedtime., Disp: 60 tablet, Rfl: 0 .  furosemide (LASIX) 40 MG tablet, Take 40 mg by mouth daily. , Disp: , Rfl:  .  hydrALAZINE (APRESOLINE) 50 MG tablet, Take 1 tablet (50 mg total) by mouth every 6 (six) hours., Disp: 120 tablet, Rfl: 0 .  insulin lispro (HUMALOG) 100 UNIT/ML injection, Inject 0-0.1 mLs (0-10 Units total) into the skin See admin instructions. Inject  0-10 units subcutaneously three times daily with meals per sliding scale (Patient taking differently: Inject 0-10 Units into the skin See admin instructions. Inject 5 units subcutaneously three times daily with meals per sliding scale), Disp: , Rfl:  .  LANTUS 100 UNIT/ML injection, Inject 0.08 mLs (8 Units total) into the skin at bedtime. Patient using Vials, Disp: , Rfl:  .  LORazepam (ATIVAN) 0.5 MG tablet, Take 1 tablet (0.5 mg total) by mouth 2 (two) times daily as needed for anxiety., Disp: 60 tablet, Rfl: 3 .  metoCLOPramide (REGLAN) 10 MG tablet, Take 1 tablet (10 mg total) by mouth every 6 (six) hours as needed for nausea or vomiting., Disp: 20 tablet, Rfl: 0 .  metoprolol tartrate (LOPRESSOR) 25 MG tablet, Take 1 tablet (25 mg total) by mouth 2 (two) times daily., Disp: 60 tablet, Rfl: 0 .  mirtazapine (REMERON) 30 MG tablet, Take 1 tablet (30 mg total) by mouth at bedtime., Disp: 30 tablet, Rfl: 5 .  ondansetron (ZOFRAN) 4 MG tablet, Take 1 tablet (4 mg total) by mouth every 8 (eight) hours as needed for nausea or vomiting (If necessary, dont combine with Reglan.)., Disp: 20 tablet, Rfl: 0 .  pantoprazole (PROTONIX) 40 MG tablet, TAKE 1 TABLET BY MOUTH  DAILY, Disp: 90 tablet, Rfl: 3 .  potassium chloride 20 MEQ TBCR, Take 20 mEq by mouth 2 (two) times daily., Disp: 14 tablet, Rfl: 0 .  promethazine (PHENERGAN) 12.5 MG tablet, Take 1 tablet (12.5 mg total) by mouth every 8 (eight) hours as needed for nausea., Disp: 60 tablet, Rfl: 1 .  promethazine (PHENERGAN) 25 MG suppository, Place 1 suppository (25 mg total) rectally every 8 (eight) hours as needed for nausea or vomiting., Disp: 10 suppository, Rfl: 0 .  traMADol (ULTRAM) 50 MG tablet, Take 1 tablet (50 mg total) by mouth every 6 (six) hours as needed for moderate pain or severe pain. (Patient not taking: Reported on 11/05/2018), Disp: 14 tablet, Rfl: 0  Allergies  Allergen Reactions  . Other Anaphylaxis    Reaction to [place] nuts   .  Lactose Intolerance (Gi) Diarrhea    Objective:   VITALS: Per patient if applicable, see vitals. GENERAL: Alert, appears well and in no acute distress. HEENT: Atraumatic, conjunctiva clear, no obvious abnormalities on inspection of external nose and ears. NECK: Normal movements of the head and neck. CARDIOPULMONARY: No increased WOB. Speaking in clear sentences. I:E ratio WNL.  MS: Moves all visible extremities without noticeable abnormality. PSYCH: Pleasant and cooperative, well-groomed. Speech normal rate and rhythm. Affect is appropriate. Insight and judgement are appropriate. Attention is focused, linear, and appropriate.  NEURO: CN grossly intact. Oriented as arrived to appointment on time with no prompting. Moves both UE equally.  SKIN: No obvious lesions, wounds, erythema, or cyanosis noted on face or hands.  Assessment and Plan:   Syleena was seen today for follow-up.  Diagnoses and all orders for this visit:  Generalized abdominal pain Mostly improved. Has GI follow-up  next month. She knows when to go to the ER in the interim. I encouraged medication compliance, especially with her DM regimen.  Hypokalemia Continue potassium supplementation as prescribed. We did make her an appointment to see Dr. Jerline Pain on 8/11 so we can recheck her potassium at that time, as well as whatever else labs may be needed.  Episodic lightheadedness Unclear etiology. I do think that her labile blood sugars are contributing to this. She would like to understand why she had this "seizure" in the hospital. I offered neuro eval vs in-office visit with PCP for thorough neuro eval and she opted for the latter. ER precautions advised in the interim.  . Reviewed expectations re: course of current medical issues. . Discussed self-management of symptoms. . Outlined signs and symptoms indicating need for more acute intervention. . Patient verbalized understanding and all questions were answered. [facility]  Maintenance issues including appropriate healthy diet, exercise, and smoking avoidance were discussed with patient. . See orders for this visit as documented in the electronic medical record.  I discussed the assessment and treatment plan with the patient. The patient was provided an opportunity to ask questions and all were answered. The patient agreed with the plan and demonstrated an understanding of the instructions.   The patient was advised to call back or seek an in-person evaluation if the symptoms worsen or if the condition fails to improve as anticipated.   CMA or LPN served as scribe during this visit. History, Physical, and Plan performed by medical provider. The above documentation has been reviewed and is accurate and complete.   [place], [place] 11/05/2018

## 2018-11-09 ENCOUNTER — Ambulatory Visit: Payer: 59 | Admitting: Family Medicine

## 2018-11-09 ENCOUNTER — Other Ambulatory Visit: Payer: Self-pay | Admitting: Gastroenterology

## 2018-11-10 ENCOUNTER — Telehealth: Payer: Self-pay

## 2018-11-10 NOTE — Telephone Encounter (Signed)
Ok to refill 

## 2018-11-10 NOTE — Telephone Encounter (Signed)
Route to SA

## 2018-11-10 NOTE — Telephone Encounter (Signed)
Spoke to pt and she is taking lasix 40 mg Daily and says it helps. She indicated that when she  was recently in the hospital she has a seizure due to a combination of meds they gave her: Ativan was one but she is not sure of what else. Her blood pressure has been fine since she has been out of the hospital. She is requesting a 90 day supply of lasix with refill to [facility]. She indicates that she also needs a new script for Pantoprazole 40mg  twice a day. Ok to give 90 day with refill?

## 2018-11-10 NOTE — Telephone Encounter (Signed)
Pt called our after hours answering service and requested Rx's for Dexcom receiver, transmitter and sensors. LOV 06/18/18. Canceled 10/29/18 appt d/t illness. NOV 11/25/18. On 10/28/18, Dr. [name] stated the following re: request for Dexcom: please schedule pt for an appt to discuss Dexcom. At this time, per Dr. Cordelia Pen direction, I will not be able to process pt request for Rx until she speaks with Dr. [name] on 11/25/18.

## 2018-11-10 NOTE — Telephone Encounter (Signed)
[name] is the patient calling this in or the pharmacy? Just curious how often she takes this. If she is using it daily and it provides benefit for her we can refill it for her, but she has had episodes of syncope, not sure if this is affecting her BP. Can you let me know? Thanks

## 2018-11-11 NOTE — Telephone Encounter (Signed)
Yes fine to refill lasix. It looks like her PCP put her on K supplement to take with it given low K previously. She should have her labs monitored by primary care in regards to K supplementation and if that needs to be changed over time. Thanks

## 2018-11-22 ENCOUNTER — Telehealth: Payer: Self-pay | Admitting: Gastroenterology

## 2018-11-22 ENCOUNTER — Other Ambulatory Visit: Payer: Self-pay

## 2018-11-22 MED ORDER — PROMETHAZINE HCL 12.5 MG PO TABS: 12.5000 mg | ORAL_TABLET | Freq: Three times a day (TID) | ORAL | 1 refills | Status: DC | PRN

## 2018-11-22 NOTE — Progress Notes (Signed)
Script sent  

## 2018-11-23 ENCOUNTER — Telehealth: Payer: Self-pay | Admitting: Gastroenterology

## 2018-11-23 MED ORDER — PROMETHAZINE HCL 25 MG RE SUPP: 25.0000 mg | Freq: Three times a day (TID) | RECTAL | 0 refills | Status: DC | PRN

## 2018-11-23 NOTE — Telephone Encounter (Signed)
Phenergan suppositories sent to [facility] on [address] since the other [facility] was out of stock

## 2018-11-24 ENCOUNTER — Emergency Department (HOSPITAL_COMMUNITY)
Admission: EM | Admit: 2018-11-24 | Discharge: 2018-11-24 | Disposition: A | Discharge status: Home / Self Care | Payer: 59 | Attending: Emergency Medicine | Admitting: Emergency Medicine

## 2018-11-24 ENCOUNTER — Emergency Department (HOSPITAL_COMMUNITY): Payer: 59

## 2018-11-24 ENCOUNTER — Other Ambulatory Visit: Payer: Self-pay

## 2018-11-24 DIAGNOSIS — E109 Type 1 diabetes mellitus without complications: Secondary | ICD-10-CM | POA: Diagnosis not present

## 2018-11-24 DIAGNOSIS — R1013 Epigastric pain: Secondary | ICD-10-CM | POA: Diagnosis not present

## 2018-11-24 DIAGNOSIS — R112 Nausea with vomiting, unspecified: Secondary | ICD-10-CM | POA: Insufficient documentation

## 2018-11-24 DIAGNOSIS — R197 Diarrhea, unspecified: Secondary | ICD-10-CM | POA: Insufficient documentation

## 2018-11-24 DIAGNOSIS — R1110 Vomiting, unspecified: Secondary | ICD-10-CM

## 2018-11-24 DIAGNOSIS — R0789 Other chest pain: Secondary | ICD-10-CM | POA: Diagnosis not present

## 2018-11-24 DIAGNOSIS — Z79899 Other long term (current) drug therapy: Secondary | ICD-10-CM | POA: Insufficient documentation

## 2018-11-24 DIAGNOSIS — I10 Essential (primary) hypertension: Secondary | ICD-10-CM | POA: Diagnosis not present

## 2018-11-24 LAB — I-STAT BETA HCG BLOOD, ED (MC, WL, AP ONLY): I-stat hCG, quantitative: <5 m[IU]/mL (ref <5)

## 2018-11-24 LAB — COMPREHENSIVE METABOLIC PANEL
ALT: 32 U/L (ref 0–44)
AST: 26 U/L (ref 15–41)
Albumin: 3.7 g/dL (ref 3.5–5.0)
Alkaline Phosphatase: 73 U/L (ref 38–126)
Anion gap: 13 (ref 5–15)
BUN: 31 mg/dL — ABNORMAL HIGH (ref 6–20)
CO2: 32 mmol/L (ref 22–32)
Calcium: 9.3 mg/dL (ref 8.9–10.3)
Chloride: 96 mmol/L — ABNORMAL LOW (ref 98–111)
Creatinine, Ser: 1.29 mg/dL — ABNORMAL HIGH (ref 0.44–1.00)
GFR calc Af Amer: >60 mL/min (ref >60)
GFR calc non Af Amer: 56 mL/min — ABNORMAL LOW (ref >60)
Glucose, Bld: 237 mg/dL — ABNORMAL HIGH (ref 70–99)
Potassium: 3.2 mmol/L — ABNORMAL LOW (ref 3.5–5.1)
Sodium: 141 mmol/L (ref 135–145)
Total Bilirubin: 0.7 mg/dL (ref 0.3–1.2)
Total Protein: 7.1 g/dL (ref 6.5–8.1)

## 2018-11-24 LAB — CBC
HCT: 33.9 % — ABNORMAL LOW (ref 36.0–46.0)
Hemoglobin: 11 g/dL — ABNORMAL LOW (ref 12.0–15.0)
MCH: 28.4 pg (ref 26.0–34.0)
MCHC: 32.4 g/dL (ref 30.0–36.0)
MCV: 87.4 fL (ref 80.0–100.0)
Platelets: 208 10*3/uL (ref 150–400)
RBC: 3.88 MIL/uL (ref 3.87–5.11)
RDW: 12.9 % (ref 11.5–15.5)
WBC: 4.5 10*3/uL (ref 4.0–10.5)
nRBC: 0 % (ref 0.0–0.2)

## 2018-11-24 LAB — TROPONIN I (HIGH SENSITIVITY)
Troponin I (High Sensitivity): 3 ng/L (ref <18)
Troponin I (High Sensitivity): 3 ng/L (ref <18)

## 2018-11-24 LAB — LIPASE, BLOOD: Lipase: 20 U/L (ref 11–51)

## 2018-11-24 IMAGING — DX CHEST - 2 VIEW
2 series · 2 of 2 positions shown · non-contrast
Comparison: [DATE]

CLINICAL DATA: Chest pain

EXAM:
CHEST - 2 VIEW

[chest lat]
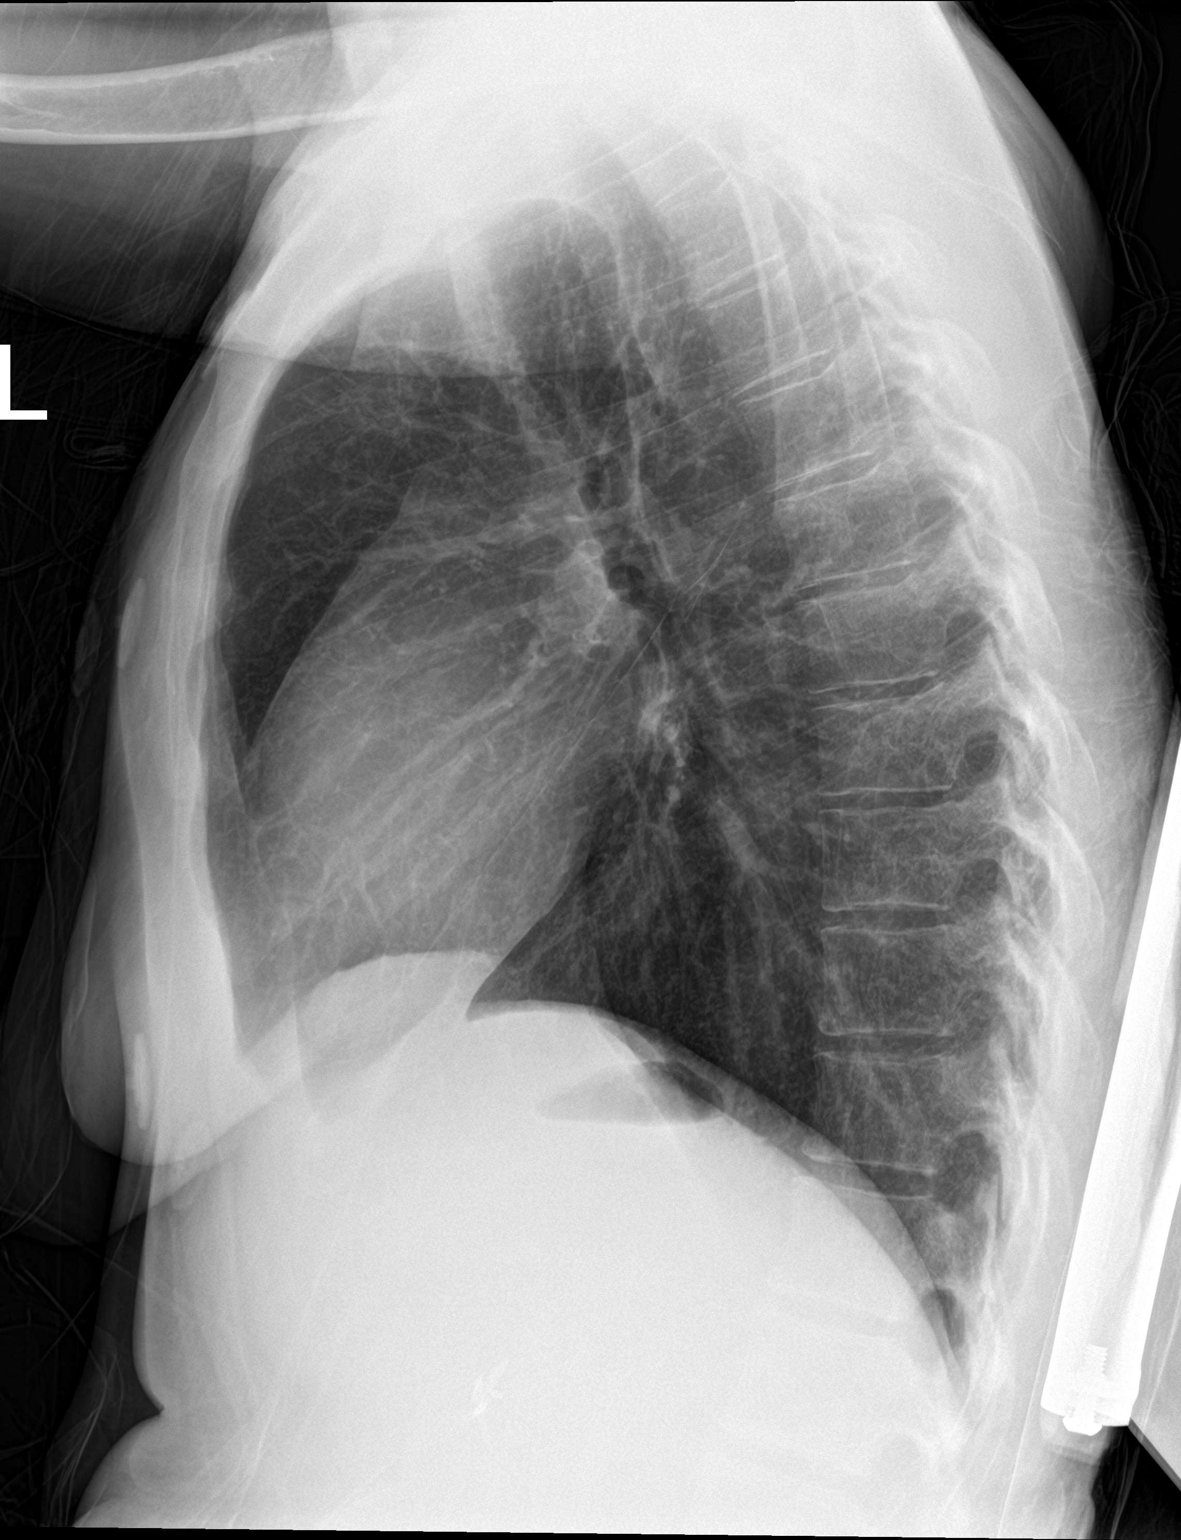

[chest ap]
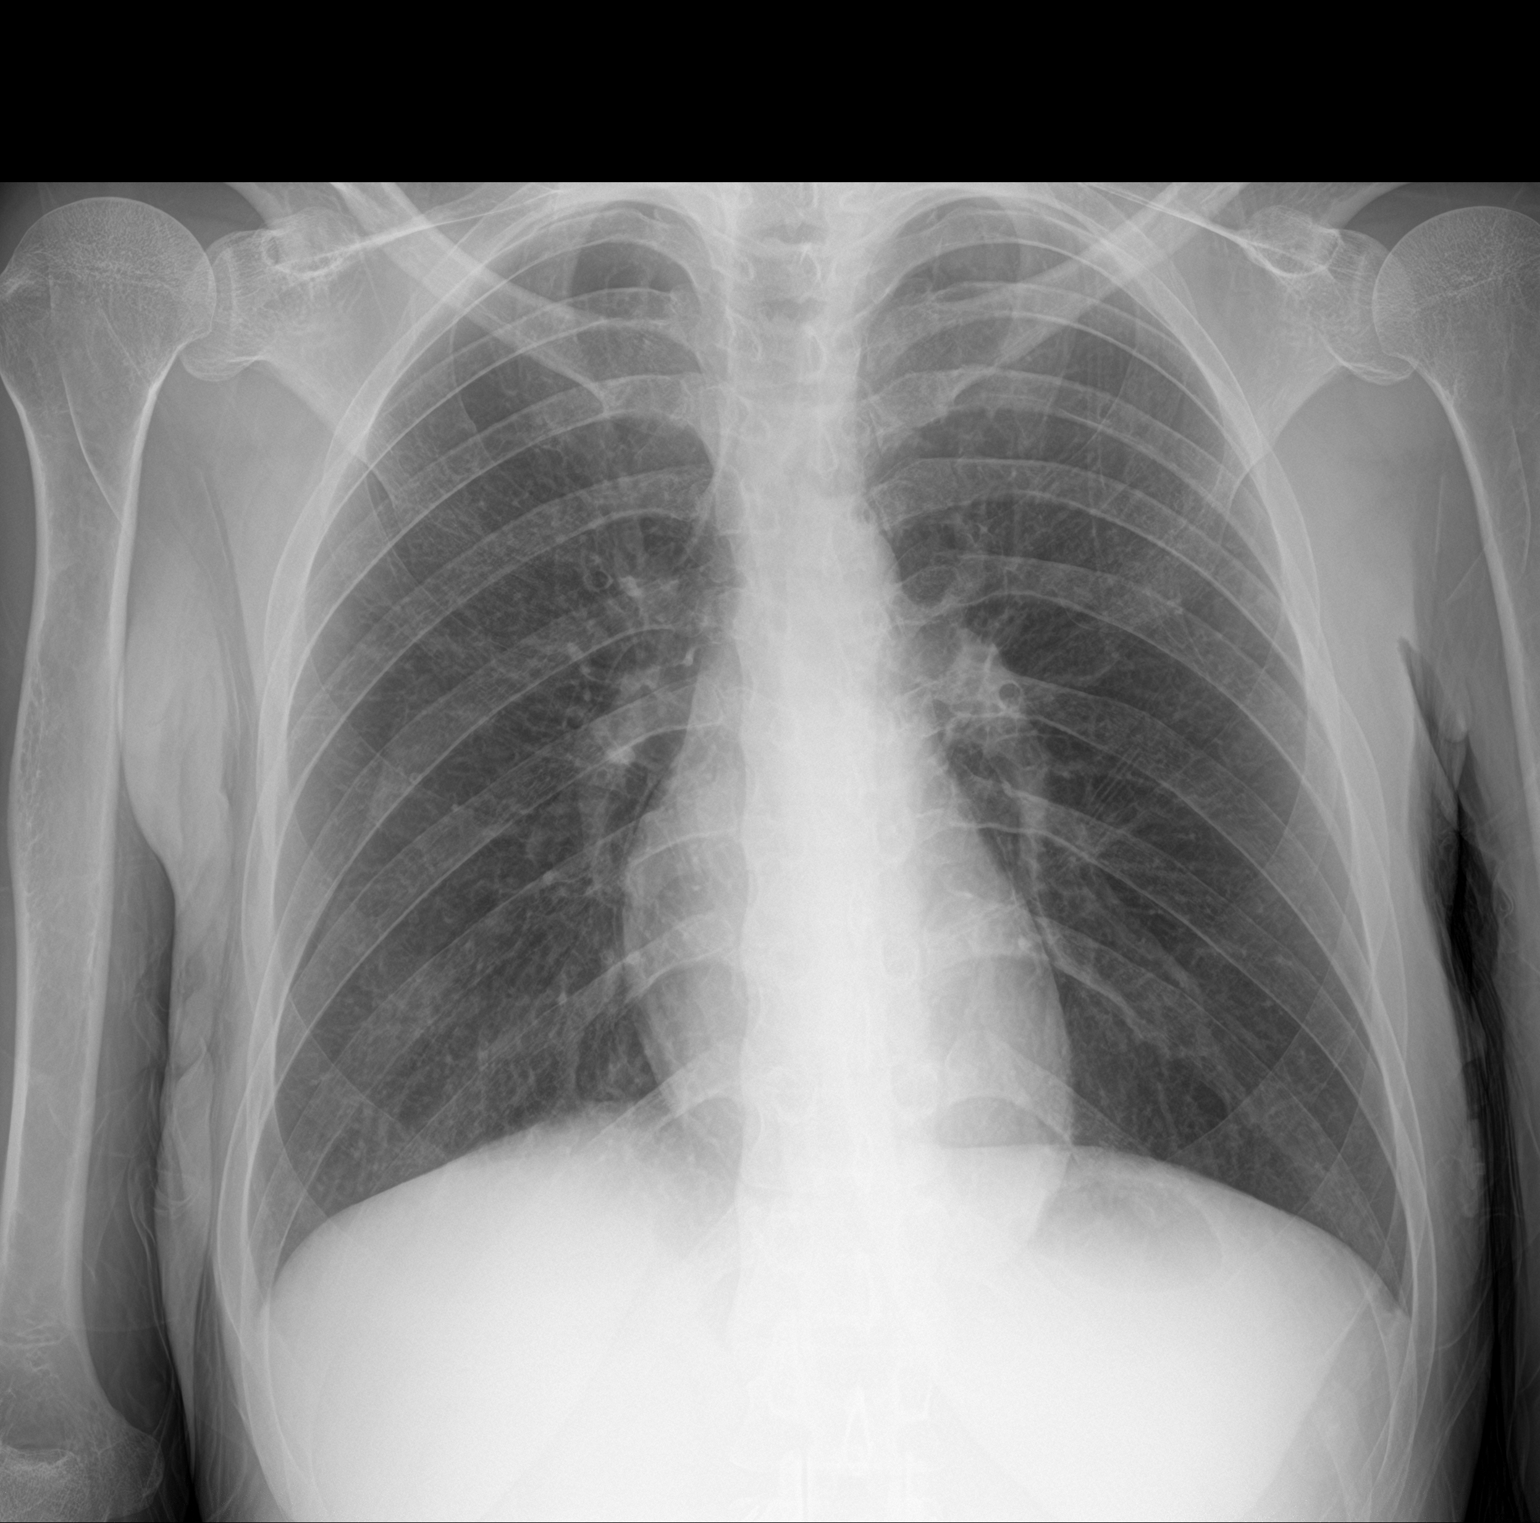

[2 of 2 positions shown; findings below may reference images not displayed]

FINDINGS: Normal heart size and mediastinal contours. No acute infiltrate or
edema. No effusion or pneumothorax. No acute osseous findings.
Cholecystectomy clips.
IMPRESSION: Negative chest.

## 2018-11-24 MED ORDER — SODIUM CHLORIDE 0.9% FLUSH: 3.0000 mL | Freq: Once | INTRAVENOUS | Status: DC

## 2018-11-24 MED ORDER — HALOPERIDOL LACTATE 5 MG/ML IJ SOLN
5.0000 mg | Freq: Once | INTRAMUSCULAR | Status: AC
  Administered 2018-11-24: 5 mg via INTRAMUSCULAR
  Filled 2018-11-24: qty 1

## 2018-11-24 MED ORDER — LORAZEPAM 2 MG/ML IJ SOLN
1.0000 mg | Freq: Once | INTRAMUSCULAR | Status: AC
  Administered 2018-11-24: 1 mg via INTRAMUSCULAR
  Filled 2018-11-24: qty 1

## 2018-11-24 NOTE — ED Provider Notes (Signed)
[facility] EMERGENCY DEPARTMENT Provider Note   CSN: [id number] Arrival date & time: 11/24/18  0405     History   Chief Complaint Chief Complaint  Patient presents with  . Abdominal Pain  . Chest Pain    HPI [name] is a 28 y.o. female.     HPI Patient has history of type 1 diabetes, chronic abdominal pain with gastroparesis and gastroesophageal reflux disease.  Presents with abdominal pain, vomiting and diarrhea which started 4 days ago.  States that the abdominal pain is located in the upper part of her abdomen and radiates into her chest.  Denies any blood in the vomit or stool.  She is had no fever or chills.  She has had difficulty tolerating oral intake.  States she has tried taking her Phenergan suppositories with little improvement.  She has called her gastroenterologist Dr. Adela Lank.  No known sick contacts.  No shortness of breath or cough. Past Medical History:  Diagnosis Date  . Allergy   . Anemia   . Anxiety   . Blood transfusion without reported diagnosis    Dec 2018  . Cataract    right eye  . Depression   . Diabetes type 1, uncontrolled (HCC) 11/14/2011   Since age 47  . Fibromyalgia   . Gastroparesis   . GERD (gastroesophageal reflux disease)   . Hypertension   . Infection    UTI April 2016    Patient Active Problem List   Diagnosis Date Noted  . Diabetic gastroparesis (HCC) 10/19/2018  . DKA, type 1 (HCC) 09/18/2018  . Pneumonia 08/20/2018  . Contraception management 08/06/2018  . Intractable vomiting with nausea 07/30/2018  . Intractable vomiting   . Cyclical vomiting syndrome 06/07/2018  . Diabetic gastroparesis associated with type 1 diabetes mellitus (HCC) 06/04/2018  . Anxiety 05/31/2018  . Peripheral edema 05/31/2018  . Acute kidney injury (HCC) 03/22/2018  . Normocytic anemia 07/10/2017  . Hypokalemia 06/23/2017  . GERD (gastroesophageal reflux disease) 06/23/2017  . Intractable nausea and vomiting  04/11/2017  . 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 Diabetes mellitus type 1 (HCC) 04/28/2013  . Protein-calorie malnutrition, severe (HCC) 04/28/2013  . Hypertension associated with diabetes (HCC) 11/14/2011  . Chronic abdominal pain 09/18/2011    Past Surgical History:  Procedure Laterality Date  . ANKLE SURGERY    . CHOLECYSTECTOMY  11/15/2011   Procedure: LAPAROSCOPIC CHOLECYSTECTOMY WITH INTRAOPERATIVE CHOLANGIOGRAM;  Surgeon: [name], MD;  Location: [facility];  Service: General;  Laterality: N/A;  . COLONOSCOPY    . COLONOSCOPY WITH PROPOFOL N/A 06/27/2017   Procedure: COLONOSCOPY WITH PROPOFOL;  Surgeon: Rachael Fee, MD;  Location: [facility];  Service: Endoscopy;  Laterality: N/A;  . ESOPHAGOGASTRODUODENOSCOPY  12/03/2011   Procedure: ESOPHAGOGASTRODUODENOSCOPY (EGD);  Surgeon: Theda Belfast, MD;  Location: [facility];  Service: Endoscopy;  Laterality: N/A;  . FLEXIBLE SIGMOIDOSCOPY N/A 03/10/2017   Procedure: FLEXIBLE SIGMOIDOSCOPY;  Surgeon: Jeani Hawking, MD;  Location: [facility];  Service: Endoscopy;  Laterality: N/A;  . INCISION AND DRAINAGE PERIRECTAL ABSCESS N/A 03/01/2017   Procedure: IRRIGATION AND DEBRIDEMENT PERIRECTAL ABSCESS;  Surgeon: Ovidio Kin, MD;  Location: [facility];  Service: General;  Laterality: N/A;  . IRRIGATION AND DEBRIDEMENT BUTTOCKS N/A 03/23/2017   Procedure: IRRIGATION AND DEBRIDEMENT BUTTOCKS, SETON PLACEMENT;  Surgeon: Romie Levee, MD;  Location: [facility];  Service: General;  Laterality: N/A;  . LAPAROSCOPY  11/23/2011   Procedure: LAPAROSCOPY DIAGNOSTIC;  Surgeon: Sharlet Salina  Lurlean Leyden, MD;  Location: [facility];  Service: General;  Laterality: N/A;  . SIGMOIDOSCOPY    . UPPER GASTROINTESTINAL ENDOSCOPY    . WISDOM TOOTH EXTRACTION       OB History    Gravida  1   Para  1   Term      Preterm  1   AB      Living  1     SAB      TAB      Ectopic      Multiple  0   Live Births  1             Home Medications    Prior to Admission medications   Medication Sig Start Date End Date Taking? Authorizing Provider  Continuous Blood Gluc Receiver (DEXCOM G6 RECEIVER) DEVI USE AS DIRECTED TO MONITOR GLUCOSE LEVELS 10/29/18   [provider]  Continuous Blood Gluc Transmit (DEXCOM G6 TRANSMITTER) MISC USE AS DIRECTED TO MONITOR GLUCOSE LEVELS 10/29/18   [provider]  diclofenac (VOLTAREN) 75 MG EC tablet Take 1 tablet (75 mg total) by mouth daily. 09/21/18   Glade Lloyd, MD  dicyclomine (BENTYL) 20 MG tablet Take 1 tablet (20 mg total) by mouth 4 (four) times daily -  before meals and at bedtime. 09/21/18   Glade Lloyd, MD  furosemide (LASIX) 40 MG tablet TAKE 1 TABLET BY MOUTH  DAILY 11/11/18   Armbruster, Willaim Rayas, MD  hydrALAZINE (APRESOLINE) 50 MG tablet Take 1 tablet (50 mg total) by mouth every 6 (six) hours. 10/23/18   Swayze, Ava, DO  insulin lispro (HUMALOG) 100 UNIT/ML injection Inject 0-0.1 mLs (0-10 Units total) into the skin See admin instructions. Inject 0-10 units subcutaneously three times daily with meals per sliding scale Patient taking differently: Inject 0-10 Units into the skin See admin instructions. Inject 5 units subcutaneously three times daily with meals per sliding scale 09/21/18   Glade Lloyd, MD  LANTUS 100 UNIT/ML injection Inject 0.08 mLs (8 Units total) into the skin at bedtime. Patient using Vials 09/21/18   Glade Lloyd, MD  LORazepam (ATIVAN) 0.5 MG tablet Take 1 tablet (0.5 mg total) by mouth 2 (two) times daily as needed for anxiety. 10/28/18   Ardith Dark, MD  metoCLOPramide (REGLAN) 10 MG tablet Take 1 tablet (10 mg total) by mouth every 6 (six) hours as needed for nausea or vomiting. 02/20/18   Burnadette Pop, MD  metoprolol tartrate (LOPRESSOR) 25 MG tablet Take 1 tablet (25 mg total) by mouth 2 (two) times daily. 10/23/18   Swayze, Ava, DO  mirtazapine (REMERON) 30 MG tablet Take 1 tablet (30 mg total) by mouth at  bedtime. 05/31/18   Ardith Dark, MD  ondansetron (ZOFRAN) 4 MG tablet Take 1 tablet (4 mg total) by mouth every 8 (eight) hours as needed for nausea or vomiting (If necessary, dont combine with Reglan.). 09/21/18   Glade Lloyd, MD  pantoprazole (PROTONIX) 40 MG tablet TAKE 1 TABLET BY MOUTH  DAILY 10/22/18   Armbruster, Willaim Rayas, MD  potassium chloride 20 MEQ TBCR Take 20 mEq by mouth 2 (two) times daily. 10/17/18   Jacalyn Lefevre, MD  promethazine (PHENERGAN) 12.5 MG tablet Take 1 tablet (12.5 mg total) by mouth every 8 (eight) hours as needed for nausea. 11/22/18   Armbruster, Willaim Rayas, MD  promethazine (PHENERGAN) 25 MG suppository Place 1 suppository (25 mg total) rectally every 8 (eight) hours as needed for nausea or vomiting. 11/23/18  Benancio DeedsArmbruster, Steven P, MD  traMADol (ULTRAM) 50 MG tablet Take 1 tablet (50 mg total) by mouth every 6 (six) hours as needed for moderate pain or severe pain. Patient not taking: Reported on 11/05/2018 09/21/18   Glade LloydAlekh, Kshitiz, MD    Family History Family History  Problem Relation Age of Onset  . Diabetes Mother   . Hypertension Father   . Colon cancer Paternal Grandmother        pt thinks PGM was dx in her 250's  . Diabetes Paternal Grandmother   . Diabetes Maternal Grandmother   . Diabetes Maternal Grandfather   . Diabetes Paternal Grandfather   . Diabetes Other   . Esophageal cancer Neg Hx   . Liver cancer Neg Hx   . Pancreatic cancer Neg Hx   . Stomach cancer Neg Hx   . Rectal cancer Neg Hx     Social History Social History   Tobacco Use  . Smoking status: Never Smoker  . Smokeless tobacco: Never Used  Substance Use Topics  . Alcohol use: No    Alcohol/week: 0.0 standard drinks    Frequency: Never  . Drug use: No     Allergies   Other and Lactose intolerance (gi)   Review of Systems Review of Systems  Constitutional: Negative for chills and fever.  HENT: Negative for sore throat and trouble swallowing.   Eyes: Negative for  visual disturbance.  Respiratory: Negative for cough and shortness of breath.   Cardiovascular: Positive for chest pain.  Gastrointestinal: Positive for abdominal pain, diarrhea, nausea and vomiting. Negative for blood in stool.  Genitourinary: Negative for dysuria, flank pain and frequency.  Musculoskeletal: Positive for back pain and myalgias. Negative for neck pain and neck stiffness.  Skin: Negative for rash and wound.  Neurological: Negative for dizziness, weakness, light-headedness, numbness and headaches.  Psychiatric/Behavioral: The patient is nervous/anxious.   All other systems reviewed and are negative.    Physical Exam Updated Vital Signs BP 117/88 (BP Location: Left Arm)   Pulse (!) 111   Temp 98.3 F (36.8 C) (Oral)   Resp 17   SpO2 98%   Physical Exam Vitals signs and nursing note reviewed.  Constitutional:      Appearance: She is well-developed.     Comments: Patient writhing in bed  HENT:     Head: Normocephalic and atraumatic.     Nose: Nose normal.     Mouth/Throat:     Mouth: Mucous membranes are moist.     Pharynx: No oropharyngeal exudate or posterior oropharyngeal erythema.  Eyes:     Extraocular Movements: Extraocular movements intact.     Pupils: Pupils are equal, round, and reactive to light.  Neck:     Musculoskeletal: Normal range of motion and neck supple. No neck rigidity or muscular tenderness.     Vascular: No carotid bruit.  Cardiovascular:     Rate and Rhythm: Regular rhythm. Tachycardia present.     Heart sounds: No murmur. No friction rub. No gallop.   Pulmonary:     Effort: Pulmonary effort is normal. No respiratory distress.     Breath sounds: Normal breath sounds. No stridor. No wheezing, rhonchi or rales.  Chest:     Chest wall: No tenderness.  Abdominal:     General: Bowel sounds are normal.     Palpations: Abdomen is soft.     Tenderness: There is abdominal tenderness. There is no right CVA tenderness, left CVA tenderness,  guarding or rebound.  Comments: Epigastric tenderness to palpation.  No rebound or guarding.  Musculoskeletal: Normal range of motion.        General: No swelling, tenderness, deformity or signs of injury.     Right lower leg: No edema.     Left lower leg: No edema.     Comments: No midline thoracic or lumbar tenderness.  No CVA tenderness.  Lymphadenopathy:     Cervical: No cervical adenopathy.  Skin:    General: Skin is warm and dry.     Findings: No erythema or rash.  Neurological:     General: No focal deficit present.     Mental Status: She is alert and oriented to person, place, and time.     Comments: Moving all extremities without focal deficit.  Sensation fully intact.  Psychiatric:     Comments: Very anxious appearing      ED Treatments / Results  Labs (all labs ordered are listed, but only abnormal results are displayed) Labs Reviewed  COMPREHENSIVE METABOLIC PANEL - Abnormal; Notable for the following components:      Result Value   Potassium 3.2 (*)    Chloride 96 (*)    Glucose, Bld 237 (*)    BUN 31 (*)    Creatinine, Ser 1.29 (*)    GFR calc non Af Amer 56 (*)    All other components within normal limits  CBC - Abnormal; Notable for the following components:   Hemoglobin 11.0 (*)    HCT 33.9 (*)    All other components within normal limits  LIPASE, BLOOD  URINALYSIS, ROUTINE W REFLEX MICROSCOPIC  I-STAT BETA HCG BLOOD, ED ([facility], [facility], [facility] ONLY)  TROPONIN I (HIGH SENSITIVITY)  TROPONIN I (HIGH SENSITIVITY)    EKG EKG Interpretation  Date/Time:  Wednesday November 24 2018 04:24:44 EDT Ventricular Rate:  108 PR Interval:  132 QRS Duration: 84 QT Interval:  338 QTC Calculation: 452 R Axis:   73 Text Interpretation:  Sinus tachycardia Right atrial enlargement Borderline ECG Confirmed by Julianne Rice [phone]) on 11/24/2018 8:13:55 AM   Radiology Dg Chest 2 View  Result Date: 11/24/2018 CLINICAL DATA:  Chest pain EXAM: CHEST - 2 VIEW COMPARISON:   11/03/2018 FINDINGS: Normal heart size and mediastinal contours. No acute infiltrate or edema. No effusion or pneumothorax. No acute osseous findings. Cholecystectomy clips. IMPRESSION: Negative chest. Electronically Signed   By: Monte Fantasia M.D.   On: 11/24/2018 05:32    Procedures Procedures (including critical care time)  Medications Ordered in ED Medications  sodium chloride flush (NS) 0.9 % injection 3 mL (3 mLs Intravenous Not Given 11/24/18 0511)  sodium chloride flush (NS) 0.9 % injection 3 mL (has no administration in time range)  LORazepam (ATIVAN) injection 1 mg (1 mg Intramuscular Given 11/24/18 0812)  haloperidol lactate (HALDOL) injection 5 mg (5 mg Intramuscular Given 11/24/18 0813)     Initial Impression / Assessment and Plan / ED Course  I have reviewed the triage vital signs and the nursing notes.  Pertinent labs & imaging results that were available during my care of the patient were reviewed by me and considered in my medical decision making (see chart for details).        Patient states she is feeling much better after medication.  Tolerating oral intake.  Vital signs remained stable.  Advised to follow-up with her gastroenterologist.  Final Clinical Impressions(s) / ED Diagnoses   Final diagnoses:  Epigastric pain  Vomiting and diarrhea  Atypical chest pain  ED Discharge Orders    None       Loren RacerYelverton, Lorayne Getchell, MD 11/24/18 1052

## 2018-11-24 NOTE — ED Notes (Signed)
Patient called out requesting pain medication and the doctor again. Advised that phys will be I as soon as possible and that pain meds cannot be given without a dr's order.

## 2018-11-24 NOTE — ED Notes (Signed)
Pt is in [place] saying she can't breath and hurting. Pt stats are at 97 % upon triage and ekg was done.

## 2018-11-24 NOTE — ED Notes (Signed)
[name] [phone] husband would like a call for updates

## 2018-11-24 NOTE — ED Notes (Signed)
Patient witnessed sticking her fingers down her throat and making herself vomit.

## 2018-11-24 NOTE — ED Notes (Signed)
Patient cries and writhes in the bed. States she needs an ice pack for her chest, she needs pain medicine and some phenergan.

## 2018-11-24 NOTE — ED Triage Notes (Signed)
Per pt she has been having abdominal pain, with n/v since Sunday.She has been also having chest pain with SOB, that is in the center. Pt says the pain is a 7/10. Pain does not radiate stays in the center

## 2018-11-24 NOTE — ED Notes (Signed)
Pt able to drink fluids and keep it down

## 2018-11-24 NOTE — ED Notes (Signed)
Patient continues to cry and is inconsolable; vomited x1. Will continue to monitor.

## 2018-11-25 ENCOUNTER — Ambulatory Visit: Payer: 59 | Admitting: Endocrinology

## 2018-11-27 ENCOUNTER — Encounter (HOSPITAL_COMMUNITY): Payer: Self-pay | Admitting: Emergency Medicine

## 2018-11-27 ENCOUNTER — Other Ambulatory Visit: Payer: Self-pay

## 2018-11-27 ENCOUNTER — Emergency Department (HOSPITAL_COMMUNITY)
Admission: EM | Admit: 2018-11-27 | Discharge: 2018-11-27 | Disposition: A | Discharge status: Home / Self Care | Payer: 59 | Attending: Emergency Medicine | Admitting: Emergency Medicine

## 2018-11-27 DIAGNOSIS — N289 Disorder of kidney and ureter, unspecified: Secondary | ICD-10-CM | POA: Diagnosis not present

## 2018-11-27 DIAGNOSIS — E876 Hypokalemia: Secondary | ICD-10-CM | POA: Insufficient documentation

## 2018-11-27 DIAGNOSIS — F1210 Cannabis abuse, uncomplicated: Secondary | ICD-10-CM | POA: Diagnosis not present

## 2018-11-27 DIAGNOSIS — I10 Essential (primary) hypertension: Secondary | ICD-10-CM | POA: Insufficient documentation

## 2018-11-27 DIAGNOSIS — E109 Type 1 diabetes mellitus without complications: Secondary | ICD-10-CM | POA: Insufficient documentation

## 2018-11-27 DIAGNOSIS — Z79899 Other long term (current) drug therapy: Secondary | ICD-10-CM | POA: Diagnosis not present

## 2018-11-27 DIAGNOSIS — K3184 Gastroparesis: Secondary | ICD-10-CM | POA: Insufficient documentation

## 2018-11-27 DIAGNOSIS — R112 Nausea with vomiting, unspecified: Secondary | ICD-10-CM | POA: Diagnosis present

## 2018-11-27 LAB — URINALYSIS, ROUTINE W REFLEX MICROSCOPIC
Bacteria, UA: NONE SEEN
Bilirubin Urine: NEGATIVE
Glucose, UA: NEGATIVE mg/dL
Hgb urine dipstick: NEGATIVE
Ketones, ur: NEGATIVE mg/dL
Nitrite: NEGATIVE
Protein, ur: NEGATIVE mg/dL
Specific Gravity, Urine: 1.009 (ref 1.005–1.030)
pH: 5 (ref 5.0–8.0)

## 2018-11-27 LAB — COMPREHENSIVE METABOLIC PANEL
ALT: 27 U/L (ref 0–44)
AST: 23 U/L (ref 15–41)
Albumin: 3.8 g/dL (ref 3.5–5.0)
Alkaline Phosphatase: 69 U/L (ref 38–126)
Anion gap: 14 (ref 5–15)
BUN: 34 mg/dL — ABNORMAL HIGH (ref 6–20)
CO2: 32 mmol/L (ref 22–32)
Calcium: 9.1 mg/dL (ref 8.9–10.3)
Chloride: 93 mmol/L — ABNORMAL LOW (ref 98–111)
Creatinine, Ser: 1.25 mg/dL — ABNORMAL HIGH (ref 0.44–1.00)
GFR calc Af Amer: >60 mL/min (ref >60)
GFR calc non Af Amer: 58 mL/min — ABNORMAL LOW (ref >60)
Glucose, Bld: 200 mg/dL — ABNORMAL HIGH (ref 70–99)
Potassium: 2.7 mmol/L — CL (ref 3.5–5.1)
Sodium: 139 mmol/L (ref 135–145)
Total Bilirubin: 0.7 mg/dL (ref 0.3–1.2)
Total Protein: 7.3 g/dL (ref 6.5–8.1)

## 2018-11-27 LAB — I-STAT BETA HCG BLOOD, ED (MC, WL, AP ONLY): I-stat hCG, quantitative: <5 m[IU]/mL (ref <5)

## 2018-11-27 LAB — CBC
HCT: 31.1 % — ABNORMAL LOW (ref 36.0–46.0)
Hemoglobin: 10.4 g/dL — ABNORMAL LOW (ref 12.0–15.0)
MCH: 28.7 pg (ref 26.0–34.0)
MCHC: 33.4 g/dL (ref 30.0–36.0)
MCV: 85.9 fL (ref 80.0–100.0)
Platelets: 190 10*3/uL (ref 150–400)
RBC: 3.62 MIL/uL — ABNORMAL LOW (ref 3.87–5.11)
RDW: 12.5 % (ref 11.5–15.5)
WBC: 5.4 10*3/uL (ref 4.0–10.5)
nRBC: 0 % (ref 0.0–0.2)

## 2018-11-27 LAB — LIPASE, BLOOD: Lipase: 25 U/L (ref 11–51)

## 2018-11-27 LAB — CBG MONITORING, ED: Glucose-Capillary: 192 mg/dL — ABNORMAL HIGH (ref 70–99)

## 2018-11-27 MED ORDER — ONDANSETRON 4 MG PO TBDP
4.0000 mg | ORAL_TABLET | Freq: Once | ORAL | Status: AC
  Administered 2018-11-27: 23:00:00 4 mg via ORAL
  Filled 2018-11-27: qty 1

## 2018-11-27 MED ORDER — HALOPERIDOL LACTATE 5 MG/ML IJ SOLN
2.0000 mg | Freq: Once | INTRAMUSCULAR | Status: AC
  Administered 2018-11-27: 2 mg via INTRAMUSCULAR
  Filled 2018-11-27: qty 1

## 2018-11-27 MED ORDER — CAPSAICIN 0.075 % EX CREA
TOPICAL_CREAM | Freq: Two times a day (BID) | CUTANEOUS | Status: DC
  Administered 2018-11-27: 23:00:00 via TOPICAL
  Filled 2018-11-27: qty 60

## 2018-11-27 MED ORDER — POTASSIUM CHLORIDE CRYS ER 20 MEQ PO TBCR
40.0000 meq | EXTENDED_RELEASE_TABLET | Freq: Once | ORAL | Status: DC
  Filled 2018-11-27: qty 2

## 2018-11-27 MED ORDER — SODIUM CHLORIDE 0.9% FLUSH: 3.0000 mL | Freq: Once | INTRAVENOUS | Status: DC

## 2018-11-27 MED ORDER — ONDANSETRON 4 MG PO TBDP
4.0000 mg | ORAL_TABLET | Freq: Once | ORAL | Status: AC | PRN
  Administered 2018-11-27: 21:00:00 4 mg via ORAL
  Filled 2018-11-27: qty 1

## 2018-11-27 NOTE — ED Notes (Signed)
Date and time results received: 11/27/18 2150 (use smartphrase ".now" to insert current time)  Test: K+ Critical Value: 2.7  Name of Provider Notified: PA Mickel Baas  Orders Received? Or Actions Taken?: Orders Received - See Orders for details

## 2018-11-27 NOTE — ED Notes (Signed)
Pt was assisted to the bathroom, upon return the pt began crying again and stating that she was having a panic attack and would like to leave. Pt again asked if she could leave. I explained to the pt that we could not keep her here if she did not want to be. I explained the risk of leaving with her potassium being so low. I asked again if the pt would like to leave. PT nodded in agreement, and signed AMA.

## 2018-11-27 NOTE — ED Triage Notes (Signed)
Pt reports having nausea and vomiting along with weakness for the last week. Pt was seen at [facility] on 11/24/2018 for same.

## 2018-11-27 NOTE — ED Notes (Signed)
Urine culture sent to the lab. 

## 2018-11-27 NOTE — ED Provider Notes (Signed)
[facility]-EMERGENCY DEPT Provider Note   CSN: [id number] Arrival date & time: 11/27/18  2022     History   Chief Complaint Chief Complaint  Patient presents with  . Abdominal Pain  . Emesis    HPI [name] is a 28 y.o. female.     HPI   Patient with prior diagnosis of gastroparesis and chronic THC use, presents for evaluation of nausea and vomiting for a week.  She was seen for same 2 days ago at an affiliated emergency department.  She also has insulin-dependent diabetes.  She did not take any insulin, today.  She states that there is a bout of vomiting and pain have been present for 1 week despite treatment.  She describes the pain is in her epigastric area.  She does not currently have any blood in her emesis.  She has not had a recent bowel movement.  She denies fever, chills, cough, shortness of breath, focal weakness or paresthesia.  She states that she has had a gastric emptying study, in 2014 but none since.  She saw her GI doctor by virtual visit, about 45 days ago.  She talked on the phone to a provider at the office several days ago.  There are no other known modifying factors.  Past Medical History:  Diagnosis Date  . Allergy   . Anemia   . Anxiety   . Blood transfusion without reported diagnosis    Dec 2018  . Cataract    right eye  . Depression   . Diabetes type 1, uncontrolled (HCC) 11/14/2011   Since age 28  . Fibromyalgia   . Gastroparesis   . GERD (gastroesophageal reflux disease)   . Hypertension   . Infection    UTI April 2016    Patient Active Problem List   Diagnosis Date Noted  . Diabetic gastroparesis (HCC) 10/19/2018  . DKA, type 1 (HCC) 09/18/2018  . Pneumonia 08/20/2018  . Contraception management 08/06/2018  . Intractable vomiting with nausea 07/30/2018  . Intractable vomiting   . Cyclical vomiting syndrome 06/07/2018  . Diabetic gastroparesis associated with type 1 diabetes mellitus (HCC) 06/04/2018  .  Anxiety 05/31/2018  . Peripheral edema 05/31/2018  . Acute kidney injury (HCC) 03/22/2018  . Normocytic anemia 07/10/2017  . Hypokalemia 06/23/2017  . GERD (gastroesophageal reflux disease) 06/23/2017  . Intractable nausea and vomiting 04/11/2017  . 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 Diabetes mellitus type 1 (HCC) 04/28/2013  . Protein-calorie malnutrition, severe (HCC) 04/28/2013  . Hypertension associated with diabetes (HCC) 11/14/2011  . Chronic abdominal pain 09/18/2011    Past Surgical History:  Procedure Laterality Date  . ANKLE SURGERY    . CHOLECYSTECTOMY  11/15/2011   Procedure: LAPAROSCOPIC CHOLECYSTECTOMY WITH INTRAOPERATIVE CHOLANGIOGRAM;  Surgeon: Ardeth SportsmanSteven C. Gross, MD;  Location: [facility];  Service: General;  Laterality: N/A;  . COLONOSCOPY    . COLONOSCOPY WITH PROPOFOL N/A 06/27/2017   Procedure: COLONOSCOPY WITH PROPOFOL;  Surgeon: Rachael FeeJacobs, Daniel P, MD;  Location: [facility];  Service: Endoscopy;  Laterality: N/A;  . ESOPHAGOGASTRODUODENOSCOPY  12/03/2011   Procedure: ESOPHAGOGASTRODUODENOSCOPY (EGD);  Surgeon: Theda BelfastPatrick D Hung, MD;  [facility];  Service: Endoscopy;  Laterality: N/A;  . FLEXIBLE SIGMOIDOSCOPY N/A 03/10/2017   Procedure: FLEXIBLE SIGMOIDOSCOPY;  Surgeon: Jeani HawkingHung, Patrick, MD;  Location: [facility];  Service: Endoscopy;  Laterality: N/A;  . INCISION AND DRAINAGE PERIRECTAL ABSCESS N/A 03/01/2017   Procedure: IRRIGATION AND DEBRIDEMENT PERIRECTAL ABSCESS;  Surgeon:  Ovidio KinNewman, David, MD;  Location: [facility];  Service: General;  Laterality: N/A;  . IRRIGATION AND DEBRIDEMENT BUTTOCKS N/A 03/23/2017   Procedure: IRRIGATION AND DEBRIDEMENT BUTTOCKS, SETON PLACEMENT;  Surgeon: Romie Leveehomas, Mayu, MD;  Location: [facility];  Service: General;  Laterality: N/A;  . LAPAROSCOPY  11/23/2011   Procedure: LAPAROSCOPY DIAGNOSTIC;  Surgeon: Mariella SaaBenjamin T Hoxworth, MD;  Location: [facility];  Service: General;  Laterality: N/A;  . SIGMOIDOSCOPY     . UPPER GASTROINTESTINAL ENDOSCOPY    . WISDOM TOOTH EXTRACTION       OB History    Gravida  1   Para  1   Term      Preterm  1   AB      Living  1     SAB      TAB      Ectopic      Multiple  0   Live Births  1            Home Medications    Prior to Admission medications   Medication Sig Start Date End Date Taking? Authorizing Provider  furosemide (LASIX) 40 MG tablet TAKE 1 TABLET BY MOUTH  DAILY Patient taking differently: Take 40 mg by mouth daily.  11/11/18  Yes Armbruster, Willaim RayasSteven P, MD  hydrALAZINE (APRESOLINE) 50 MG tablet Take 1 tablet (50 mg total) by mouth every 6 (six) hours. 10/23/18  Yes Swayze, Ava, DO  insulin lispro (HUMALOG) 100 UNIT/ML injection Inject 0-0.1 mLs (0-10 Units total) into the skin See admin instructions. Inject 0-10 units subcutaneously three times daily with meals per sliding scale Patient taking differently: Inject 0-10 Units into the skin See admin instructions. Inject 5 units subcutaneously three times daily with meals per sliding scale 09/21/18  Yes Alekh, Kshitiz, MD  LANTUS 100 UNIT/ML injection Inject 0.08 mLs (8 Units total) into the skin at bedtime. Patient using Vials 09/21/18  Yes Glade LloydAlekh, Kshitiz, MD  LORazepam (ATIVAN) 0.5 MG tablet Take 1 tablet (0.5 mg total) by mouth 2 (two) times daily as needed for anxiety. 10/28/18  Yes Ardith DarkParker, Caleb M, MD  metoCLOPramide (REGLAN) 10 MG tablet Take 1 tablet (10 mg total) by mouth every 6 (six) hours as needed for nausea or vomiting. 02/20/18  Yes Burnadette PopAdhikari, Amrit, MD  metoprolol tartrate (LOPRESSOR) 25 MG tablet Take 1 tablet (25 mg total) by mouth 2 (two) times daily. 10/23/18  Yes Swayze, Ava, DO  mirtazapine (REMERON) 30 MG tablet Take 1 tablet (30 mg total) by mouth at bedtime. 05/31/18  Yes Ardith DarkParker, Caleb M, MD  ondansetron (ZOFRAN) 4 MG tablet Take 1 tablet (4 mg total) by mouth every 8 (eight) hours as needed for nausea or vomiting (If necessary, dont combine with Reglan.). 09/21/18  Yes  Glade LloydAlekh, Kshitiz, MD  pantoprazole (PROTONIX) 40 MG tablet TAKE 1 TABLET BY MOUTH  DAILY Patient taking differently: Take 40 mg by mouth daily.  10/22/18  Yes Armbruster, Willaim RayasSteven P, MD  potassium chloride 20 MEQ TBCR Take 20 mEq by mouth 2 (two) times daily. 10/17/18  Yes Jacalyn LefevreHaviland, Julie, MD  promethazine (PHENERGAN) 12.5 MG tablet Take 1 tablet (12.5 mg total) by mouth every 8 (eight) hours as needed for nausea. 11/22/18  Yes Armbruster, Willaim RayasSteven P, MD  promethazine (PHENERGAN) 25 MG suppository Place 1 suppository (25 mg total) rectally every 8 (eight) hours as needed for nausea or vomiting. 11/23/18  Yes Armbruster, Willaim RayasSteven P, MD  Continuous Blood Gluc Receiver (DEXCOM G6 RECEIVER) DEVI USE AS DIRECTED TO MONITOR  GLUCOSE LEVELS 10/29/18   [provider]  Continuous Blood Gluc Transmit (DEXCOM G6 TRANSMITTER) MISC USE AS DIRECTED TO MONITOR GLUCOSE LEVELS 10/29/18   [provider]  diclofenac (VOLTAREN) 75 MG EC tablet Take 1 tablet (75 mg total) by mouth daily. Patient not taking: Reported on 11/27/2018 09/21/18   Glade Lloyd, MD  dicyclomine (BENTYL) 20 MG tablet Take 1 tablet (20 mg total) by mouth 4 (four) times daily -  before meals and at bedtime. Patient not taking: Reported on 11/27/2018 09/21/18   Glade Lloyd, MD  traMADol (ULTRAM) 50 MG tablet Take 1 tablet (50 mg total) by mouth every 6 (six) hours as needed for moderate pain or severe pain. Patient not taking: Reported on 11/05/2018 09/21/18   Glade Lloyd, MD    Family History Family History  Problem Relation Age of Onset  . Diabetes Mother   . Hypertension Father   . Colon cancer Paternal Grandmother        pt thinks PGM was dx in her 16's  . Diabetes Paternal Grandmother   . Diabetes Maternal Grandmother   . Diabetes Maternal Grandfather   . Diabetes Paternal Grandfather   . Diabetes Other   . Esophageal cancer Neg Hx   . Liver cancer Neg Hx   . Pancreatic cancer Neg Hx   . Stomach cancer Neg Hx   . Rectal  cancer Neg Hx     Social History Social History   Tobacco Use  . Smoking status: Never Smoker  . Smokeless tobacco: Never Used  Substance Use Topics  . Alcohol use: No    Alcohol/week: 0.0 standard drinks    Frequency: Never  . Drug use: No     Allergies   Other and Lactose intolerance (gi)   Review of Systems Review of Systems  All other systems reviewed and are negative.    Physical Exam Updated Vital Signs BP 114/75   Pulse 99   Temp 98.8 F (37.1 C) (Oral)   Resp 20   Ht 5\' 7"  (1.702 m)   Wt 56.7 kg   SpO2 100%   BMI 19.58 kg/m   Physical Exam Vitals signs and nursing note reviewed.  Constitutional:      General: She is in acute distress (Tearful).     Appearance: She is well-developed. She is not ill-appearing, toxic-appearing or diaphoretic.  HENT:     Head: Normocephalic and atraumatic.     Right Ear: External ear normal.     Left Ear: External ear normal.     Mouth/Throat:     Mouth: Mucous membranes are moist.     Pharynx: No oropharyngeal exudate or posterior oropharyngeal erythema.  Eyes:     Conjunctiva/sclera: Conjunctivae normal.     Pupils: Pupils are equal, round, and reactive to light.  Neck:     Musculoskeletal: Normal range of motion and neck supple.     Trachea: Phonation normal.  Cardiovascular:     Rate and Rhythm: Normal rate.  Pulmonary:     Effort: Pulmonary effort is normal.  Abdominal:     Palpations: Abdomen is soft. There is no mass.     Tenderness: There is abdominal tenderness (Epigastric, mild). There is no guarding.     Hernia: No hernia is present.  Musculoskeletal: Normal range of motion.        General: No swelling or tenderness.  Skin:    General: Skin is warm and dry.     Coloration: Skin is not jaundiced  or pale.  Neurological:     Mental Status: She is alert and oriented to person, place, and time.     Cranial Nerves: No cranial nerve deficit.     Sensory: No sensory deficit.     Motor: No abnormal  muscle tone.     Coordination: Coordination normal.  Psychiatric:        Behavior: Behavior normal.        Thought Content: Thought content normal.        Judgment: Judgment normal.     Comments: Tearful      ED Treatments / Results  Labs (all labs ordered are listed, but only abnormal results are displayed) Labs Reviewed  COMPREHENSIVE METABOLIC PANEL - Abnormal; Notable for the following components:      Result Value   Potassium 2.7 (*)    Chloride 93 (*)    Glucose, Bld 200 (*)    BUN 34 (*)    Creatinine, Ser 1.25 (*)    GFR calc non Af Amer 58 (*)    All other components within normal limits  CBC - Abnormal; Notable for the following components:   RBC 3.62 (*)    Hemoglobin 10.4 (*)    HCT 31.1 (*)    All other components within normal limits  URINALYSIS, ROUTINE W REFLEX MICROSCOPIC - Abnormal; Notable for the following components:   Leukocytes,Ua TRACE (*)    All other components within normal limits  CBG MONITORING, ED - Abnormal; Notable for the following components:   Glucose-Capillary 192 (*)    All other components within normal limits  LIPASE, BLOOD  I-STAT BETA HCG BLOOD, ED ([facility], [facility], [facility] ONLY)    EKG None  Radiology No results found.  Procedures Procedures (including critical care time)  Medications Ordered in ED Medications  sodium chloride flush (NS) 0.9 % injection 3 mL (0 mLs Intravenous Hold 11/27/18 2243)  capsicum (ZOSTRIX) 0.075 % cream ( Topical Given 11/27/18 2256)  potassium chloride SA (K-DUR) CR tablet 40 mEq (has no administration in time range)  ondansetron (ZOFRAN-ODT) disintegrating tablet 4 mg (4 mg Oral Given 11/27/18 2106)  haloperidol lactate (HALDOL) injection 2 mg (2 mg Intramuscular Given 11/27/18 2258)  ondansetron (ZOFRAN-ODT) disintegrating tablet 4 mg (4 mg Oral Given 11/27/18 2255)     Initial Impression / Assessment and Plan / ED Course  I have reviewed the triage vital signs and the nursing notes.  Pertinent labs &  imaging results that were available during my care of the patient were reviewed by me and considered in my medical decision making (see chart for details).  Clinical Course as of Nov 27 2311  Sat Nov 27, 2018  2311 Normal  I-Stat beta hCG blood, ED [EW]  2311 Normal  Lipase, blood [EW]  2312 Normal, note specific gravity normal  Urinalysis, Routine w reflex microscopic(!) [EW]  2312 Normal except potassium low, chloride low, glucose high, BUN high, creatinine high  Comprehensive metabolic panel(!!) [EW]  2312 Normal except hemoglobin low  CBC(!) [EW]    Clinical Course User Index [EW] Mancel Bale, MD        Patient Vitals for the past 24 hrs:  BP Temp Temp src Pulse Resp SpO2 Height Weight  11/27/18 2300 114/75 - - 99 20 100 % - -  11/27/18 2230 106/85 - - - 16 - - -  11/27/18 2200 133/82 - - - 17 - - -  11/27/18 2106 - - - - - - 5\' 7"  (1.702  m) 56.7 kg  11/27/18 2029 111/78 98.8 F (37.1 C) Oral 95 16 100 % - 54.4 kg    Discussed care and treatment plan with oncoming provider team who assumes care at 11:10 PM  Medical Decision Making: Recurrent abdominal pain with vomiting and hypokalemia.  Recent evaluation, several days ago.  CRITICAL CARE-no Performed by: Daleen Bo    Final Clinical Impressions(s) / ED Diagnoses   Final diagnoses:  Gastroparesis  Hypokalemia  Renal insufficiency  Marijuana abuse    ED Discharge Orders    None       Daleen Bo, MD 11/27/18 2314

## 2018-11-27 NOTE — ED Triage Notes (Signed)
Pt also reports not taking insulin since yesterday.

## 2018-11-27 NOTE — ED Notes (Signed)
Pt crying at this time in room. I informed the pt of her low potassium, and the reasoning why we would be placing her on the tele monitor.

## 2018-12-11 ENCOUNTER — Emergency Department (HOSPITAL_COMMUNITY)
Admission: EM | Admit: 2018-12-11 | Discharge: 2018-12-11 | Disposition: A | Discharge status: Home / Self Care | Payer: 59 | Attending: Emergency Medicine | Admitting: Emergency Medicine

## 2018-12-11 ENCOUNTER — Encounter (HOSPITAL_COMMUNITY): Payer: Self-pay | Admitting: *Deleted

## 2018-12-11 ENCOUNTER — Other Ambulatory Visit: Payer: Self-pay

## 2018-12-11 DIAGNOSIS — I10 Essential (primary) hypertension: Secondary | ICD-10-CM | POA: Diagnosis not present

## 2018-12-11 DIAGNOSIS — R1013 Epigastric pain: Secondary | ICD-10-CM | POA: Diagnosis not present

## 2018-12-11 DIAGNOSIS — E1065 Type 1 diabetes mellitus with hyperglycemia: Secondary | ICD-10-CM | POA: Insufficient documentation

## 2018-12-11 DIAGNOSIS — R112 Nausea with vomiting, unspecified: Secondary | ICD-10-CM

## 2018-12-11 DIAGNOSIS — Z79899 Other long term (current) drug therapy: Secondary | ICD-10-CM | POA: Diagnosis not present

## 2018-12-11 DIAGNOSIS — R739 Hyperglycemia, unspecified: Secondary | ICD-10-CM

## 2018-12-11 LAB — CBC
HCT: 33.7 % — ABNORMAL LOW (ref 36.0–46.0)
Hemoglobin: 11.2 g/dL — ABNORMAL LOW (ref 12.0–15.0)
MCH: 28.6 pg (ref 26.0–34.0)
MCHC: 33.2 g/dL (ref 30.0–36.0)
MCV: 86 fL (ref 80.0–100.0)
Platelets: 192 10*3/uL (ref 150–400)
RBC: 3.92 MIL/uL (ref 3.87–5.11)
RDW: 12.4 % (ref 11.5–15.5)
WBC: 4.8 10*3/uL (ref 4.0–10.5)
nRBC: 0 % (ref 0.0–0.2)

## 2018-12-11 LAB — COMPREHENSIVE METABOLIC PANEL
ALT: 16 U/L (ref 0–44)
AST: 19 U/L (ref 15–41)
Albumin: 4.1 g/dL (ref 3.5–5.0)
Alkaline Phosphatase: 89 U/L (ref 38–126)
Anion gap: 16 — ABNORMAL HIGH (ref 5–15)
BUN: 28 mg/dL — ABNORMAL HIGH (ref 6–20)
CO2: 26 mmol/L (ref 22–32)
Calcium: 10 mg/dL (ref 8.9–10.3)
Chloride: 96 mmol/L — ABNORMAL LOW (ref 98–111)
Creatinine, Ser: 0.91 mg/dL (ref 0.44–1.00)
GFR calc Af Amer: >60 mL/min (ref >60)
GFR calc non Af Amer: >60 mL/min (ref >60)
Glucose, Bld: 427 mg/dL — ABNORMAL HIGH (ref 70–99)
Potassium: 4.2 mmol/L (ref 3.5–5.1)
Sodium: 138 mmol/L (ref 135–145)
Total Bilirubin: 0.6 mg/dL (ref 0.3–1.2)
Total Protein: 8.3 g/dL — ABNORMAL HIGH (ref 6.5–8.1)

## 2018-12-11 LAB — HCG, QUANTITATIVE, PREGNANCY: hCG, Beta Chain, Quant, S: <1 m[IU]/mL (ref <5)

## 2018-12-11 LAB — URINALYSIS, ROUTINE W REFLEX MICROSCOPIC
Bacteria, UA: NONE SEEN
Bilirubin Urine: NEGATIVE
Glucose, UA: >=500 mg/dL — AB
Ketones, ur: 20 mg/dL — AB
Leukocytes,Ua: NEGATIVE
Nitrite: NEGATIVE
Protein, ur: >=300 mg/dL — AB
Specific Gravity, Urine: 1.023 (ref 1.005–1.030)
pH: 5 (ref 5.0–8.0)

## 2018-12-11 LAB — LIPASE, BLOOD: Lipase: 24 U/L (ref 11–51)

## 2018-12-11 LAB — CBG MONITORING, ED
Glucose-Capillary: 434 mg/dL — ABNORMAL HIGH (ref 70–99)
Glucose-Capillary: 372 mg/dL — ABNORMAL HIGH (ref 70–99)

## 2018-12-11 MED ORDER — HALOPERIDOL LACTATE 5 MG/ML IJ SOLN
5.0000 mg | Freq: Once | INTRAMUSCULAR | Status: AC
  Administered 2018-12-11: 5 mg via INTRAVENOUS
  Filled 2018-12-11: qty 1

## 2018-12-11 MED ORDER — SODIUM CHLORIDE 0.9 % IV BOLUS
1000.0000 mL | Freq: Once | INTRAVENOUS | Status: AC
  Administered 2018-12-11: 1000 mL via INTRAVENOUS

## 2018-12-11 MED ORDER — SODIUM CHLORIDE 0.9% FLUSH: 3.0000 mL | Freq: Once | INTRAVENOUS | Status: DC

## 2018-12-11 NOTE — ED Notes (Signed)
Pt able to tolerate fluids 

## 2018-12-11 NOTE — ED Notes (Signed)
Pt provided with 2 warm blankets.  

## 2018-12-11 NOTE — ED Triage Notes (Signed)
Pt says that she has had upper abdominal pain, vomiting and diarrhea, hx of gastroparesis. Took phenergan 1 hour ago without relief.

## 2018-12-11 NOTE — ED Provider Notes (Signed)
[facility]-EMERGENCY DEPT Provider Note  CSN: [id number] Arrival date & time: 12/11/18 9678  Chief Complaint(s) Emesis  HPI [name] is a 28 y.o. female with a past medical history listed below including diabetes, gastroparesis, THC use who presents to the emergency department with 2 days of gradually worsening epigastric abdominal pain with nausea and nonbloody nonbilious emesis.  Similar to prior episodes.  Endorses chronic diarrhea.  No chest pain or shortness of breath.  Pain worse with emesis.  No alleviating factors.  Denies any recent fevers or infections.  No urinary symptoms.  No other physical complaints.  HPI  Past Medical History Past Medical History:  Diagnosis Date  . Allergy   . Anemia   . Anxiety   . Blood transfusion without reported diagnosis    Dec 2018  . Cataract    right eye  . Depression   . Diabetes type 1, uncontrolled (HCC) 11/14/2011   Since age 75  . Fibromyalgia   . Gastroparesis   . GERD (gastroesophageal reflux disease)   . Hypertension   . Infection    UTI April 2016   Patient Active Problem List   Diagnosis Date Noted  . Diabetic gastroparesis (HCC) 10/19/2018  . DKA, type 1 (HCC) 09/18/2018  . Pneumonia 08/20/2018  . Contraception management 08/06/2018  . Intractable vomiting with nausea 07/30/2018  . Intractable vomiting   . Cyclical vomiting syndrome 06/07/2018  . Diabetic gastroparesis associated with type 1 diabetes mellitus (HCC) 06/04/2018  . Anxiety 05/31/2018  . Peripheral edema 05/31/2018  . Acute kidney injury (HCC) 03/22/2018  . Normocytic anemia 07/10/2017  . Hypokalemia 06/23/2017  . GERD (gastroesophageal reflux disease) 06/23/2017  . Intractable nausea and vomiting 04/11/2017  . 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 Diabetes mellitus type 1 (HCC) 04/28/2013  . Protein-calorie malnutrition, severe (HCC) 04/28/2013  . Hypertension associated with  diabetes (HCC) 11/14/2011  . Chronic abdominal pain 09/18/2011   Home Medication(s) Prior to Admission medications   Medication Sig Start Date End Date Taking? Authorizing Provider  Continuous Blood Gluc Receiver (DEXCOM G6 RECEIVER) DEVI USE AS DIRECTED TO MONITOR GLUCOSE LEVELS 10/29/18   [provider]  Continuous Blood Gluc Transmit (DEXCOM G6 TRANSMITTER) MISC USE AS DIRECTED TO MONITOR GLUCOSE LEVELS 10/29/18   [provider]  diclofenac (VOLTAREN) 75 MG EC tablet Take 1 tablet (75 mg total) by mouth daily. Patient not taking: Reported on 11/27/2018 09/21/18   Glade Lloyd, MD  dicyclomine (BENTYL) 20 MG tablet Take 1 tablet (20 mg total) by mouth 4 (four) times daily -  before meals and at bedtime. Patient not taking: Reported on 11/27/2018 09/21/18   Glade Lloyd, MD  furosemide (LASIX) 40 MG tablet TAKE 1 TABLET BY MOUTH  DAILY Patient taking differently: Take 40 mg by mouth daily.  11/11/18   Armbruster, Willaim Rayas, MD  hydrALAZINE (APRESOLINE) 50 MG tablet Take 1 tablet (50 mg total) by mouth every 6 (six) hours. 10/23/18   Swayze, Ava, DO  insulin lispro (HUMALOG) 100 UNIT/ML injection Inject 0-0.1 mLs (0-10 Units total) into the skin See admin instructions. Inject 0-10 units subcutaneously three times daily with meals per sliding scale Patient taking differently: Inject 0-10 Units into the skin See admin instructions. Inject 5 units subcutaneously three times daily with meals per sliding scale 09/21/18   Glade Lloyd, MD  LANTUS 100 UNIT/ML injection Inject 0.08 mLs (8 Units total) into the skin at bedtime. Patient using Vials 09/21/18  Glade LloydAlekh, Kshitiz, MD  LORazepam (ATIVAN) 0.5 MG tablet Take 1 tablet (0.5 mg total) by mouth 2 (two) times daily as needed for anxiety. 10/28/18   Ardith DarkParker, Caleb M, MD  metoCLOPramide (REGLAN) 10 MG tablet Take 1 tablet (10 mg total) by mouth every 6 (six) hours as needed for nausea or vomiting. 02/20/18   Burnadette PopAdhikari, Amrit, MD  metoprolol  tartrate (LOPRESSOR) 25 MG tablet Take 1 tablet (25 mg total) by mouth 2 (two) times daily. 10/23/18   Swayze, Ava, DO  mirtazapine (REMERON) 30 MG tablet Take 1 tablet (30 mg total) by mouth at bedtime. 05/31/18   Ardith DarkParker, Caleb M, MD  ondansetron (ZOFRAN) 4 MG tablet Take 1 tablet (4 mg total) by mouth every 8 (eight) hours as needed for nausea or vomiting (If necessary, dont combine with Reglan.). 09/21/18   Glade LloydAlekh, Kshitiz, MD  pantoprazole (PROTONIX) 40 MG tablet TAKE 1 TABLET BY MOUTH  DAILY Patient taking differently: Take 40 mg by mouth daily.  10/22/18   Armbruster, Willaim RayasSteven P, MD  potassium chloride 20 MEQ TBCR Take 20 mEq by mouth 2 (two) times daily. 10/17/18   Jacalyn LefevreHaviland, Julie, MD  promethazine (PHENERGAN) 12.5 MG tablet Take 1 tablet (12.5 mg total) by mouth every 8 (eight) hours as needed for nausea. 11/22/18   Armbruster, Willaim RayasSteven P, MD  promethazine (PHENERGAN) 25 MG suppository Place 1 suppository (25 mg total) rectally every 8 (eight) hours as needed for nausea or vomiting. 11/23/18   Armbruster, Willaim RayasSteven P, MD  traMADol (ULTRAM) 50 MG tablet Take 1 tablet (50 mg total) by mouth every 6 (six) hours as needed for moderate pain or severe pain. Patient not taking: Reported on 11/05/2018 09/21/18   Glade LloydAlekh, Kshitiz, MD                                                                                                                                    Past Surgical History Past Surgical History:  Procedure Laterality Date  . ANKLE SURGERY    . CHOLECYSTECTOMY  11/15/2011   Procedure: LAPAROSCOPIC CHOLECYSTECTOMY WITH INTRAOPERATIVE CHOLANGIOGRAM;  Surgeon: Ardeth SportsmanSteven C. Gross, MD;  Location: [facility];  Service: General;  Laterality: N/A;  . COLONOSCOPY    . COLONOSCOPY WITH PROPOFOL N/A 06/27/2017   Procedure: COLONOSCOPY WITH PROPOFOL;  Surgeon: Rachael FeeJacobs, Daniel P, MD;  Location: [facility];  Service: Endoscopy;  Laterality: N/A;  . ESOPHAGOGASTRODUODENOSCOPY  12/03/2011   Procedure: ESOPHAGOGASTRODUODENOSCOPY  (EGD);  Surgeon: Theda BelfastPatrick D Hung, MD;  [facility];  Service: Endoscopy;  Laterality: N/A;  . FLEXIBLE SIGMOIDOSCOPY N/A 03/10/2017   Procedure: FLEXIBLE SIGMOIDOSCOPY;  Surgeon: Jeani HawkingHung, Patrick, MD;  Location: [facility];  Service: Endoscopy;  Laterality: N/A;  . INCISION AND DRAINAGE PERIRECTAL ABSCESS N/A 03/01/2017   Procedure: IRRIGATION AND DEBRIDEMENT PERIRECTAL ABSCESS;  Surgeon: Ovidio KinNewman, David, MD;  Location: [facility];  Service: General;  Laterality: N/A;  . IRRIGATION AND DEBRIDEMENT BUTTOCKS N/A 03/23/2017  Procedure: IRRIGATION AND DEBRIDEMENT BUTTOCKS, SETON PLACEMENT;  Surgeon: Romie Levee, MD;  Location: [facility];  Service: General;  Laterality: N/A;  . LAPAROSCOPY  11/23/2011   Procedure: LAPAROSCOPY DIAGNOSTIC;  Surgeon: Mariella Saa, MD;  Location: [facility];  Service: General;  Laterality: N/A;  . SIGMOIDOSCOPY    . UPPER GASTROINTESTINAL ENDOSCOPY    . WISDOM TOOTH EXTRACTION     Family History Family History  Problem Relation Age of Onset  . Diabetes Mother   . Hypertension Father   . Colon cancer Paternal Grandmother        pt thinks PGM was dx in her 8's  . Diabetes Paternal Grandmother   . Diabetes Maternal Grandmother   . Diabetes Maternal Grandfather   . Diabetes Paternal Grandfather   . Diabetes Other   . Esophageal cancer Neg Hx   . Liver cancer Neg Hx   . Pancreatic cancer Neg Hx   . Stomach cancer Neg Hx   . Rectal cancer Neg Hx     Social History Social History   Tobacco Use  . Smoking status: Never Smoker  . Smokeless tobacco: Never Used  Substance Use Topics  . Alcohol use: No    Alcohol/week: 0.0 standard drinks    Frequency: Never  . Drug use: No   Allergies Other and Lactose intolerance (gi)  Review of Systems Review of Systems All other systems are reviewed and are negative for acute change except as noted in the HPI  Physical Exam Vital Signs  I have reviewed the triage vital signs BP (!) 140/109 (BP Location:  Right Arm)   Pulse (!) 119   Temp 98.9 F (37.2 C) (Oral)   Resp (!) 22   SpO2 100%   Physical Exam Vitals signs reviewed.  Constitutional:      General: She is not in acute distress.    Appearance: She is well-developed. She is not diaphoretic.  HENT:     Head: Normocephalic and atraumatic.     Right Ear: External ear normal.     Left Ear: External ear normal.     Nose: Nose normal.  Eyes:     General: No scleral icterus.    Conjunctiva/sclera: Conjunctivae normal.  Neck:     Musculoskeletal: Normal range of motion.     Trachea: Phonation normal.  Cardiovascular:     Rate and Rhythm: Normal rate and regular rhythm.  Pulmonary:     Effort: Pulmonary effort is normal. No respiratory distress.     Breath sounds: No stridor.  Abdominal:     General: There is no distension.     Tenderness: There is abdominal tenderness (mild) in the epigastric area.  Musculoskeletal: Normal range of motion.  Neurological:     Mental Status: She is alert and oriented to person, place, and time.  Psychiatric:        Behavior: Behavior normal.     ED Results and Treatments Labs (all labs ordered are listed, but only abnormal results are displayed) Labs Reviewed  COMPREHENSIVE METABOLIC PANEL - Abnormal; Notable for the following components:      Result Value   Chloride 96 (*)    Glucose, Bld 427 (*)    BUN 28 (*)    Total Protein 8.3 (*)    Anion gap 16 (*)    All other components within normal limits  CBC - Abnormal; Notable for the following components:   Hemoglobin 11.2 (*)    HCT 33.7 (*)  All other components within normal limits  URINALYSIS, ROUTINE W REFLEX MICROSCOPIC - Abnormal; Notable for the following components:   Glucose, UA >=500 (*)    Hgb urine dipstick SMALL (*)    Ketones, ur 20 (*)    Protein, ur >=300 (*)    All other components within normal limits  CBG MONITORING, ED - Abnormal; Notable for the following components:   Glucose-Capillary 434 (*)    All  other components within normal limits  CBG MONITORING, ED - Abnormal; Notable for the following components:   Glucose-Capillary 372 (*)    All other components within normal limits  LIPASE, BLOOD  HCG, QUANTITATIVE, PREGNANCY  I-STAT BETA HCG BLOOD, ED ([facility], [facility], [facility] ONLY)                                                                                                                         EKG  EKG Interpretation  Date/Time:    Ventricular Rate:    PR Interval:    QRS Duration:   QT Interval:    QTC Calculation:   R Axis:     Text Interpretation:        Radiology No results found.  Pertinent labs & imaging results that were available during my care of the patient were reviewed by me and considered in my medical decision making (see chart for details).  Medications Ordered in ED Medications  sodium chloride flush (NS) 0.9 % injection 3 mL (0 mLs Intravenous Hold 12/11/18 0429)  sodium chloride 0.9 % bolus 1,000 mL (1,000 mLs Intravenous New Bag/Given 12/11/18 0506)  haloperidol lactate (HALDOL) injection 5 mg (5 mg Intravenous Given 12/11/18 0504)                                                                                                                                    Procedures Procedures  (including critical care time)  Medical Decision Making / ED Course I have reviewed the nursing notes for this encounter and the patient's prior records (if available in EHR or on provided paperwork).   [name] was evaluated in Emergency Department on 12/11/2018 for the symptoms described in the history of present illness. She was evaluated in the context of the global COVID-19 pandemic, which necessitated consideration that the patient might be at risk for infection with the SARS-CoV-2 virus that causes COVID-19. Institutional protocols and algorithms that pertain  to the evaluation of patients at risk for COVID-19 are in a state of rapid change based on information released by  regulatory bodies including the CDC and federal and state organizations. These policies and algorithms were followed during the patient's care in the ED.  Epigastric abdominal discomfort with mild tenderness but no signs of peritonitis.  Similar to previous episodes.  Labs with hyperglycemia but not consistent with DKA.  CBG negative.  Patient provided with IV fluids and Haldol resulting in complete resolution.  Able to tolerate oral intake.  States she feels good enough to go home.  Low suspicion for serious intra-abdominal inflammatory/infectious process or bowel obstruction.  The patient appears reasonably screened and/or stabilized for discharge and I doubt any other medical condition or other [facility] requiring further screening, evaluation, or treatment in the ED at this time prior to discharge.  The patient is safe for discharge with strict return precautions.       Final Clinical Impression(s) / ED Diagnoses Final diagnoses:  Nausea and vomiting in adult  Epigastric abdominal pain  Hyperglycemia    The patient appears reasonably screened and/or stabilized for discharge and I doubt any other medical condition or other [facility] requiring further screening, evaluation, or treatment in the ED at this time prior to discharge.  Disposition: Discharge  Condition: Good  I have discussed the results, Dx and Tx plan with the patient who expressed understanding and agree(s) with the plan. Discharge instructions discussed at great length. The patient was given strict return precautions who verbalized understanding of the instructions. No further questions at time of discharge.    ED Discharge Orders    None      Follow Up: Ardith DarkParker, Caleb M, MD [address] [place] [address] [phone]  Schedule an appointment as soon as possible for a visit  As needed     This chart was dictated using voice recognition software.  Despite best efforts to proofread,  errors can occur which can  change the documentation meaning.   Nira Connardama, Zaylee Cornia Eduardo, MD 12/11/18 [phone]

## 2018-12-11 NOTE — ED Notes (Signed)
Urine culture sent to the lab. 

## 2018-12-11 NOTE — ED Notes (Signed)
Pt stated being unable to provide a urine specimen at this time. 

## 2018-12-11 NOTE — ED Notes (Signed)
Pt provided with Ginger Ale.  °

## 2018-12-11 NOTE — ED Notes (Signed)
Pt provided with 2 heat packs

## 2018-12-14 ENCOUNTER — Ambulatory Visit: Payer: 59 | Admitting: Gastroenterology

## 2018-12-17 ENCOUNTER — Telehealth: Payer: Self-pay

## 2018-12-17 ENCOUNTER — Other Ambulatory Visit: Payer: Self-pay | Admitting: Gastroenterology

## 2018-12-17 MED ORDER — PROMETHAZINE HCL 25 MG RE SUPP: 25.0000 mg | Freq: Three times a day (TID) | RECTAL | 0 refills | Status: DC | PRN

## 2018-12-17 NOTE — Telephone Encounter (Signed)
-----   Message from Yetta Flock, MD sent at 12/17/2018  1:23 PM EDT ----- Yes that's fine. Can refill and give her 30 with a refill. She no showed the other day in clinic by the way.  Thanks! ----- Message ----- From: Audrea Muscat, CMA Sent: 12/17/2018  10:59 AM EDT To: Yetta Flock, MD  Patient requesting a refill of Phenergan suppositories.  Last office visit was 06/30/18.  Last refill of #10 was 11/23/2018.  Ok to refill?

## 2018-12-17 NOTE — Telephone Encounter (Signed)
Pt needs rf for promethazine sent to [facility] on  [address].

## 2018-12-21 ENCOUNTER — Other Ambulatory Visit: Payer: Self-pay | Admitting: Endocrinology

## 2018-12-21 ENCOUNTER — Telehealth: Payer: Self-pay | Admitting: Endocrinology

## 2018-12-21 DIAGNOSIS — E1010 Type 1 diabetes mellitus with ketoacidosis without coma: Secondary | ICD-10-CM

## 2018-12-21 NOTE — Telephone Encounter (Signed)
Pt and stated she received the [facility] receiver, transmitter and sensors.  She did not however receive the Dexcom applicator.  She is requesting that this be sent to her [place] on [address]

## 2018-12-21 NOTE — Telephone Encounter (Signed)
I am not familiar with this type of request. Please advise

## 2018-12-21 NOTE — Telephone Encounter (Signed)
Patient is thinking that she needs something to insert the sensor.  I tried to explain that she does not need anything, so I set an appointment for tomorrow for training on how to insert the sensor.

## 2018-12-22 ENCOUNTER — Other Ambulatory Visit: Payer: Self-pay

## 2018-12-22 ENCOUNTER — Encounter (HOSPITAL_COMMUNITY): Payer: Self-pay

## 2018-12-22 ENCOUNTER — Emergency Department (HOSPITAL_COMMUNITY)
Admission: EM | Admit: 2018-12-22 | Discharge: 2018-12-22 | Discharge status: Against Medical Advice | Payer: 59 | Source: Home / Self Care

## 2018-12-22 ENCOUNTER — Encounter: Payer: 59 | Attending: Nutrition | Admitting: Nutrition

## 2018-12-22 ENCOUNTER — Telehealth: Payer: Self-pay

## 2018-12-22 DIAGNOSIS — E1043 Type 1 diabetes mellitus with diabetic autonomic (poly)neuropathy: Secondary | ICD-10-CM | POA: Diagnosis not present

## 2018-12-22 DIAGNOSIS — R109 Unspecified abdominal pain: Secondary | ICD-10-CM | POA: Diagnosis not present

## 2018-12-22 DIAGNOSIS — Z5321 Procedure and treatment not carried out due to patient leaving prior to being seen by health care provider: Secondary | ICD-10-CM | POA: Insufficient documentation

## 2018-12-22 LAB — COMPREHENSIVE METABOLIC PANEL
ALT: 13 U/L (ref 0–44)
AST: 13 U/L — ABNORMAL LOW (ref 15–41)
Albumin: 3.5 g/dL (ref 3.5–5.0)
Alkaline Phosphatase: 91 U/L (ref 38–126)
Anion gap: 19 — ABNORMAL HIGH (ref 5–15)
BUN: 21 mg/dL — ABNORMAL HIGH (ref 6–20)
CO2: 26 mmol/L (ref 22–32)
Calcium: 9.4 mg/dL (ref 8.9–10.3)
Chloride: 88 mmol/L — ABNORMAL LOW (ref 98–111)
Creatinine, Ser: 1.46 mg/dL — ABNORMAL HIGH (ref 0.44–1.00)
GFR calc Af Amer: 56 mL/min — ABNORMAL LOW (ref >60)
GFR calc non Af Amer: 48 mL/min — ABNORMAL LOW (ref >60)
Glucose, Bld: 612 mg/dL (ref 70–99)
Potassium: 3.2 mmol/L — ABNORMAL LOW (ref 3.5–5.1)
Sodium: 133 mmol/L — ABNORMAL LOW (ref 135–145)
Total Bilirubin: 0.5 mg/dL (ref 0.3–1.2)
Total Protein: 7 g/dL (ref 6.5–8.1)

## 2018-12-22 LAB — CBC
HCT: 31.3 % — ABNORMAL LOW (ref 36.0–46.0)
Hemoglobin: 10.3 g/dL — ABNORMAL LOW (ref 12.0–15.0)
MCH: 28 pg (ref 26.0–34.0)
MCHC: 32.9 g/dL (ref 30.0–36.0)
MCV: 85.1 fL (ref 80.0–100.0)
Platelets: 271 10*3/uL (ref 150–400)
RBC: 3.68 MIL/uL — ABNORMAL LOW (ref 3.87–5.11)
RDW: 12.1 % (ref 11.5–15.5)
WBC: 3.9 10*3/uL — ABNORMAL LOW (ref 4.0–10.5)
nRBC: 0 % (ref 0.0–0.2)

## 2018-12-22 LAB — I-STAT BETA HCG BLOOD, ED (MC, WL, AP ONLY): I-stat hCG, quantitative: <5 m[IU]/mL (ref <5)

## 2018-12-22 LAB — LIPASE, BLOOD: Lipase: 21 U/L (ref 11–51)

## 2018-12-22 MED ORDER — SODIUM CHLORIDE 0.9% FLUSH: 3.0000 mL | Freq: Once | INTRAVENOUS | Status: DC

## 2018-12-22 NOTE — ED Triage Notes (Signed)
Patient complains of ongoing abdominal pain with vomiting and SOB. Patient hyperventilating on arrival. Speaking complete sentences, tearful. Also reports mild diarrhea with same

## 2018-12-22 NOTE — ED Notes (Signed)
Pt LWBS 

## 2018-12-22 NOTE — Telephone Encounter (Signed)
Patient is scheduled for appointment on 12/31/18 at 3:15 p.m.

## 2018-12-22 NOTE — ED Notes (Signed)
Pt reported to have left my registration. Labels found in empty chair.

## 2018-12-22 NOTE — Telephone Encounter (Signed)
Following message received from Dr. [name]:  please contact patient: ov is due   Routing to front desk for scheduling purposes.

## 2018-12-23 ENCOUNTER — Emergency Department (HOSPITAL_COMMUNITY): Payer: 59

## 2018-12-23 ENCOUNTER — Encounter (HOSPITAL_COMMUNITY): Payer: Self-pay | Admitting: *Deleted

## 2018-12-23 ENCOUNTER — Observation Stay (HOSPITAL_COMMUNITY): Payer: 59

## 2018-12-23 ENCOUNTER — Inpatient Hospital Stay (HOSPITAL_COMMUNITY)
Admission: EM | Admit: 2018-12-23 | Discharge: 2018-12-25 | DRG: 074 | Disposition: A | Discharge status: Home / Self Care | Payer: 59 | Attending: Internal Medicine | Admitting: Internal Medicine

## 2018-12-23 ENCOUNTER — Other Ambulatory Visit: Payer: Self-pay

## 2018-12-23 DIAGNOSIS — R112 Nausea with vomiting, unspecified: Secondary | ICD-10-CM

## 2018-12-23 DIAGNOSIS — E1043 Type 1 diabetes mellitus with diabetic autonomic (poly)neuropathy: Principal | ICD-10-CM | POA: Diagnosis present

## 2018-12-23 DIAGNOSIS — Z833 Family history of diabetes mellitus: Secondary | ICD-10-CM

## 2018-12-23 DIAGNOSIS — E739 Lactose intolerance, unspecified: Secondary | ICD-10-CM | POA: Diagnosis present

## 2018-12-23 DIAGNOSIS — M797 Fibromyalgia: Secondary | ICD-10-CM | POA: Diagnosis present

## 2018-12-23 DIAGNOSIS — Z794 Long term (current) use of insulin: Secondary | ICD-10-CM

## 2018-12-23 DIAGNOSIS — N179 Acute kidney failure, unspecified: Secondary | ICD-10-CM | POA: Diagnosis present

## 2018-12-23 DIAGNOSIS — Z79899 Other long term (current) drug therapy: Secondary | ICD-10-CM

## 2018-12-23 DIAGNOSIS — F1290 Cannabis use, unspecified, uncomplicated: Secondary | ICD-10-CM | POA: Diagnosis present

## 2018-12-23 DIAGNOSIS — D631 Anemia in chronic kidney disease: Secondary | ICD-10-CM | POA: Diagnosis present

## 2018-12-23 DIAGNOSIS — E1010 Type 1 diabetes mellitus with ketoacidosis without coma: Secondary | ICD-10-CM | POA: Diagnosis present

## 2018-12-23 DIAGNOSIS — Z8249 Family history of ischemic heart disease and other diseases of the circulatory system: Secondary | ICD-10-CM

## 2018-12-23 DIAGNOSIS — E1310 Other specified diabetes mellitus with ketoacidosis without coma: Secondary | ICD-10-CM

## 2018-12-23 DIAGNOSIS — Z91018 Allergy to other foods: Secondary | ICD-10-CM

## 2018-12-23 DIAGNOSIS — E1069 Type 1 diabetes mellitus with other specified complication: Secondary | ICD-10-CM

## 2018-12-23 DIAGNOSIS — E1143 Type 2 diabetes mellitus with diabetic autonomic (poly)neuropathy: Secondary | ICD-10-CM

## 2018-12-23 DIAGNOSIS — Z20828 Contact with and (suspected) exposure to other viral communicable diseases: Secondary | ICD-10-CM | POA: Diagnosis present

## 2018-12-23 DIAGNOSIS — Z800 Family history of malignant neoplasm of digestive organs: Secondary | ICD-10-CM

## 2018-12-23 DIAGNOSIS — T383X6A Underdosing of insulin and oral hypoglycemic [antidiabetic] drugs, initial encounter: Secondary | ICD-10-CM | POA: Diagnosis present

## 2018-12-23 DIAGNOSIS — K3184 Gastroparesis: Secondary | ICD-10-CM | POA: Diagnosis present

## 2018-12-23 DIAGNOSIS — R1013 Epigastric pain: Secondary | ICD-10-CM

## 2018-12-23 DIAGNOSIS — F419 Anxiety disorder, unspecified: Secondary | ICD-10-CM

## 2018-12-23 DIAGNOSIS — Z9049 Acquired absence of other specified parts of digestive tract: Secondary | ICD-10-CM

## 2018-12-23 DIAGNOSIS — R109 Unspecified abdominal pain: Secondary | ICD-10-CM

## 2018-12-23 DIAGNOSIS — K219 Gastro-esophageal reflux disease without esophagitis: Secondary | ICD-10-CM | POA: Diagnosis present

## 2018-12-23 DIAGNOSIS — K210 Gastro-esophageal reflux disease with esophagitis: Secondary | ICD-10-CM

## 2018-12-23 DIAGNOSIS — E109 Type 1 diabetes mellitus without complications: Secondary | ICD-10-CM | POA: Diagnosis present

## 2018-12-23 DIAGNOSIS — F329 Major depressive disorder, single episode, unspecified: Secondary | ICD-10-CM | POA: Diagnosis present

## 2018-12-23 DIAGNOSIS — Z79891 Long term (current) use of opiate analgesic: Secondary | ICD-10-CM

## 2018-12-23 DIAGNOSIS — I10 Essential (primary) hypertension: Secondary | ICD-10-CM | POA: Diagnosis present

## 2018-12-23 LAB — COMPREHENSIVE METABOLIC PANEL
ALT: 14 U/L (ref 0–44)
AST: 16 U/L (ref 15–41)
Albumin: 3.7 g/dL (ref 3.5–5.0)
Alkaline Phosphatase: 96 U/L (ref 38–126)
Anion gap: 18 — ABNORMAL HIGH (ref 5–15)
BUN: 22 mg/dL — ABNORMAL HIGH (ref 6–20)
CO2: 31 mmol/L (ref 22–32)
Calcium: 9.5 mg/dL (ref 8.9–10.3)
Chloride: 83 mmol/L — ABNORMAL LOW (ref 98–111)
Creatinine, Ser: 1.46 mg/dL — ABNORMAL HIGH (ref 0.44–1.00)
GFR calc Af Amer: 56 mL/min — ABNORMAL LOW (ref >60)
GFR calc non Af Amer: 48 mL/min — ABNORMAL LOW (ref >60)
Glucose, Bld: 679 mg/dL (ref 70–99)
Potassium: 3.6 mmol/L (ref 3.5–5.1)
Sodium: 132 mmol/L — ABNORMAL LOW (ref 135–145)
Total Bilirubin: 0.7 mg/dL (ref 0.3–1.2)
Total Protein: 7.5 g/dL (ref 6.5–8.1)

## 2018-12-23 LAB — CBC
HCT: 31.5 % — ABNORMAL LOW (ref 36.0–46.0)
Hemoglobin: 10.9 g/dL — ABNORMAL LOW (ref 12.0–15.0)
MCH: 28.8 pg (ref 26.0–34.0)
MCHC: 34.6 g/dL (ref 30.0–36.0)
MCV: 83.3 fL (ref 80.0–100.0)
Platelets: 281 10*3/uL (ref 150–400)
RBC: 3.78 MIL/uL — ABNORMAL LOW (ref 3.87–5.11)
RDW: 12.1 % (ref 11.5–15.5)
WBC: 4.2 10*3/uL (ref 4.0–10.5)
nRBC: 0 % (ref 0.0–0.2)

## 2018-12-23 LAB — BASIC METABOLIC PANEL
Anion gap: 12 (ref 5–15)
BUN: 20 mg/dL (ref 6–20)
CO2: 28 mmol/L (ref 22–32)
Calcium: 8.6 mg/dL — ABNORMAL LOW (ref 8.9–10.3)
Chloride: 97 mmol/L — ABNORMAL LOW (ref 98–111)
Creatinine, Ser: 1.16 mg/dL — ABNORMAL HIGH (ref 0.44–1.00)
GFR calc Af Amer: >60 mL/min (ref >60)
GFR calc non Af Amer: >60 mL/min (ref >60)
Glucose, Bld: 268 mg/dL — ABNORMAL HIGH (ref 70–99)
Potassium: 4.3 mmol/L (ref 3.5–5.1)
Sodium: 137 mmol/L (ref 135–145)

## 2018-12-23 LAB — URINALYSIS, ROUTINE W REFLEX MICROSCOPIC
Bacteria, UA: NONE SEEN
Bilirubin Urine: NEGATIVE
Glucose, UA: >=500 mg/dL — AB
Ketones, ur: 5 mg/dL — AB
Leukocytes,Ua: NEGATIVE
Nitrite: NEGATIVE
Protein, ur: 30 mg/dL — AB
Specific Gravity, Urine: 1.021 (ref 1.005–1.030)
pH: 6 (ref 5.0–8.0)

## 2018-12-23 LAB — I-STAT BETA HCG BLOOD, ED (MC, WL, AP ONLY): I-stat hCG, quantitative: <5 m[IU]/mL (ref <5)

## 2018-12-23 LAB — CBG MONITORING, ED: Glucose-Capillary: 314 mg/dL — ABNORMAL HIGH (ref 70–99)

## 2018-12-23 LAB — LIPASE, BLOOD: Lipase: 24 U/L (ref 11–51)

## 2018-12-23 IMAGING — DX DG CHEST 1V PORT
1 series · 1 of 1 positions shown · non-contrast
Comparison: [DATE]

CLINICAL DATA: Chest pain and weakness for 3 days.

EXAM:
PORTABLE CHEST 1 VIEW

[chest ap]
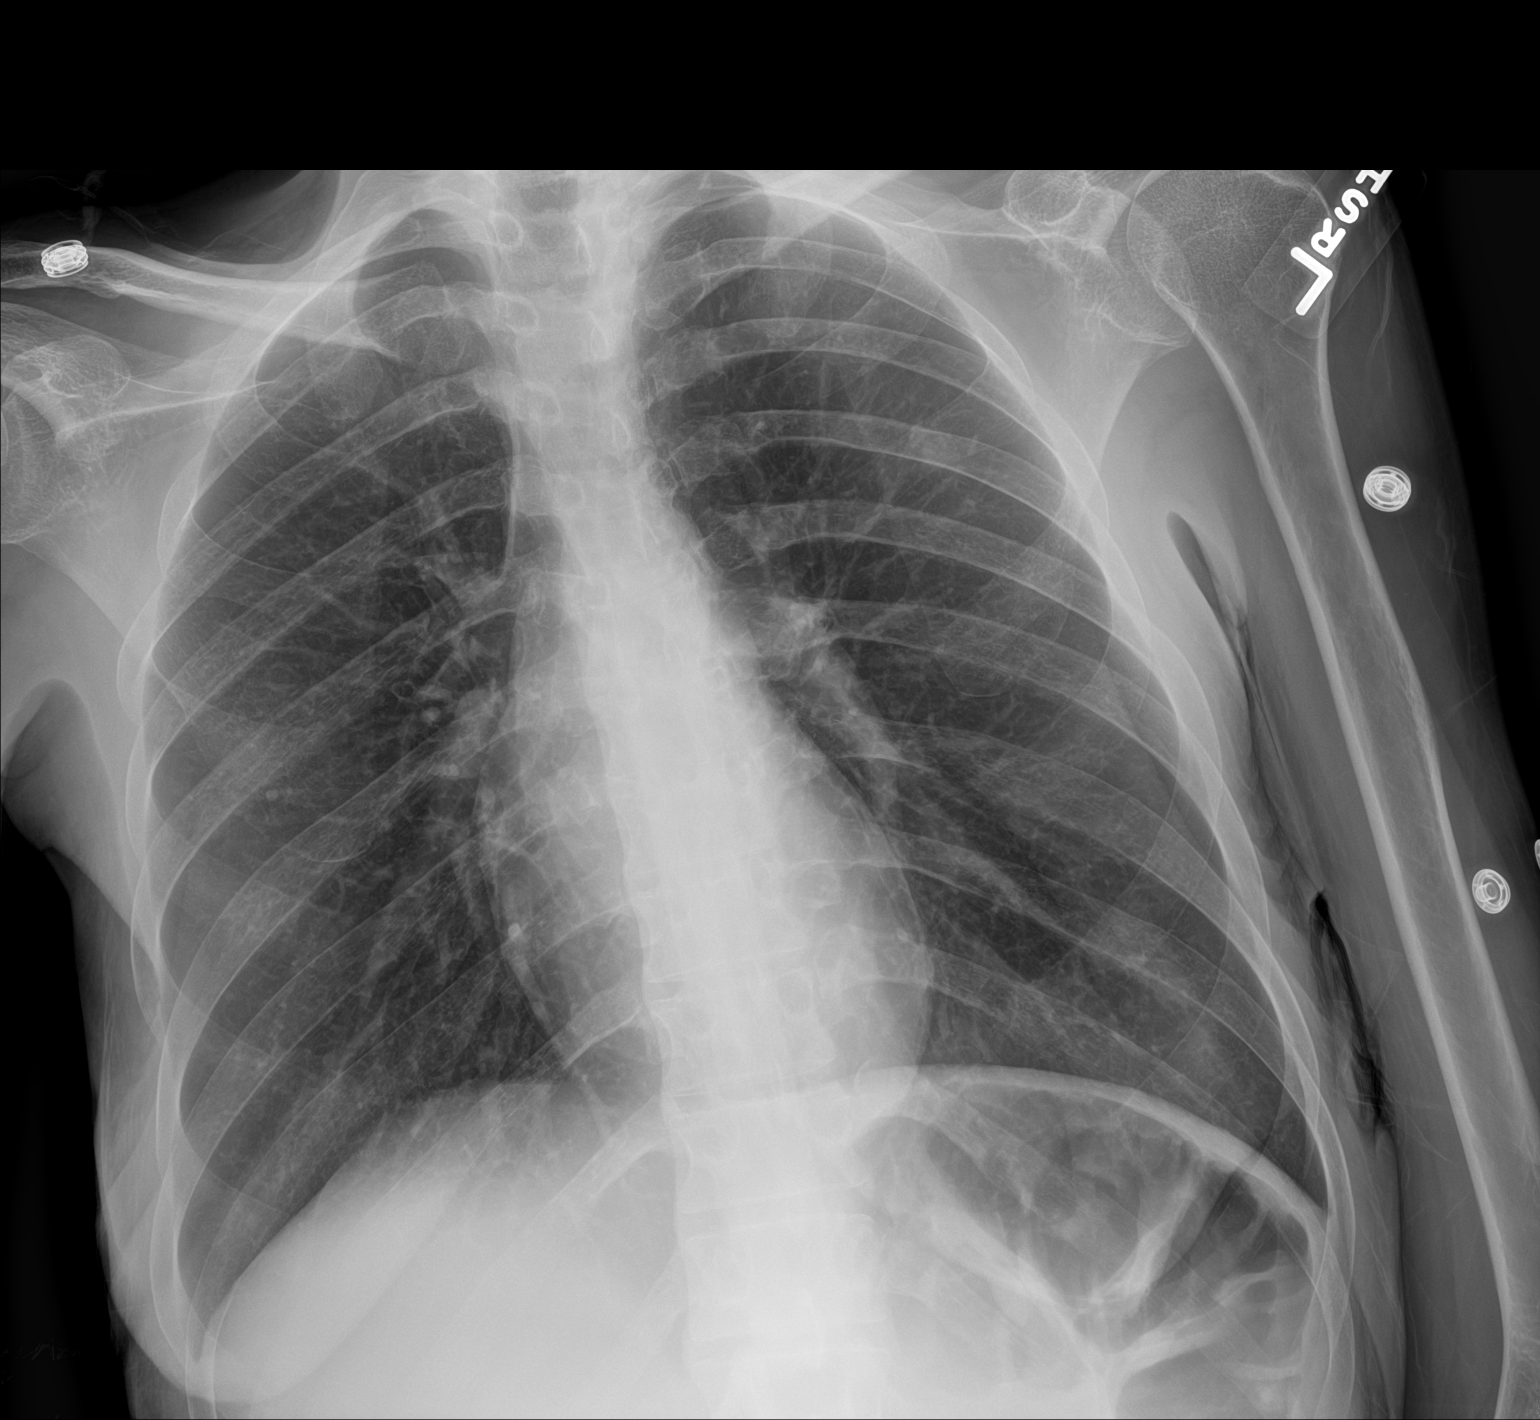

[1 of 1 positions shown; findings below may reference images not displayed]

FINDINGS: The heart size and mediastinal contours are within normal limits.
Both lungs are clear. The visualized skeletal structures are
unremarkable.
IMPRESSION: No active disease.

## 2018-12-23 IMAGING — CR DG ABDOMEN 2V
2 series · 2 of 2 positions shown · non-contrast
Comparison: [DATE]

CLINICAL DATA: Abdominal pain, vomiting x1 week. Hx of
hypertension, diabetes, gastroparesis, GERD, sigmoidoscopy([6Z]),
cholecystectomy([6Z]). Nonsmoker.

EXAM:
ABDOMEN - 2 VIEW

[abdomen erect]
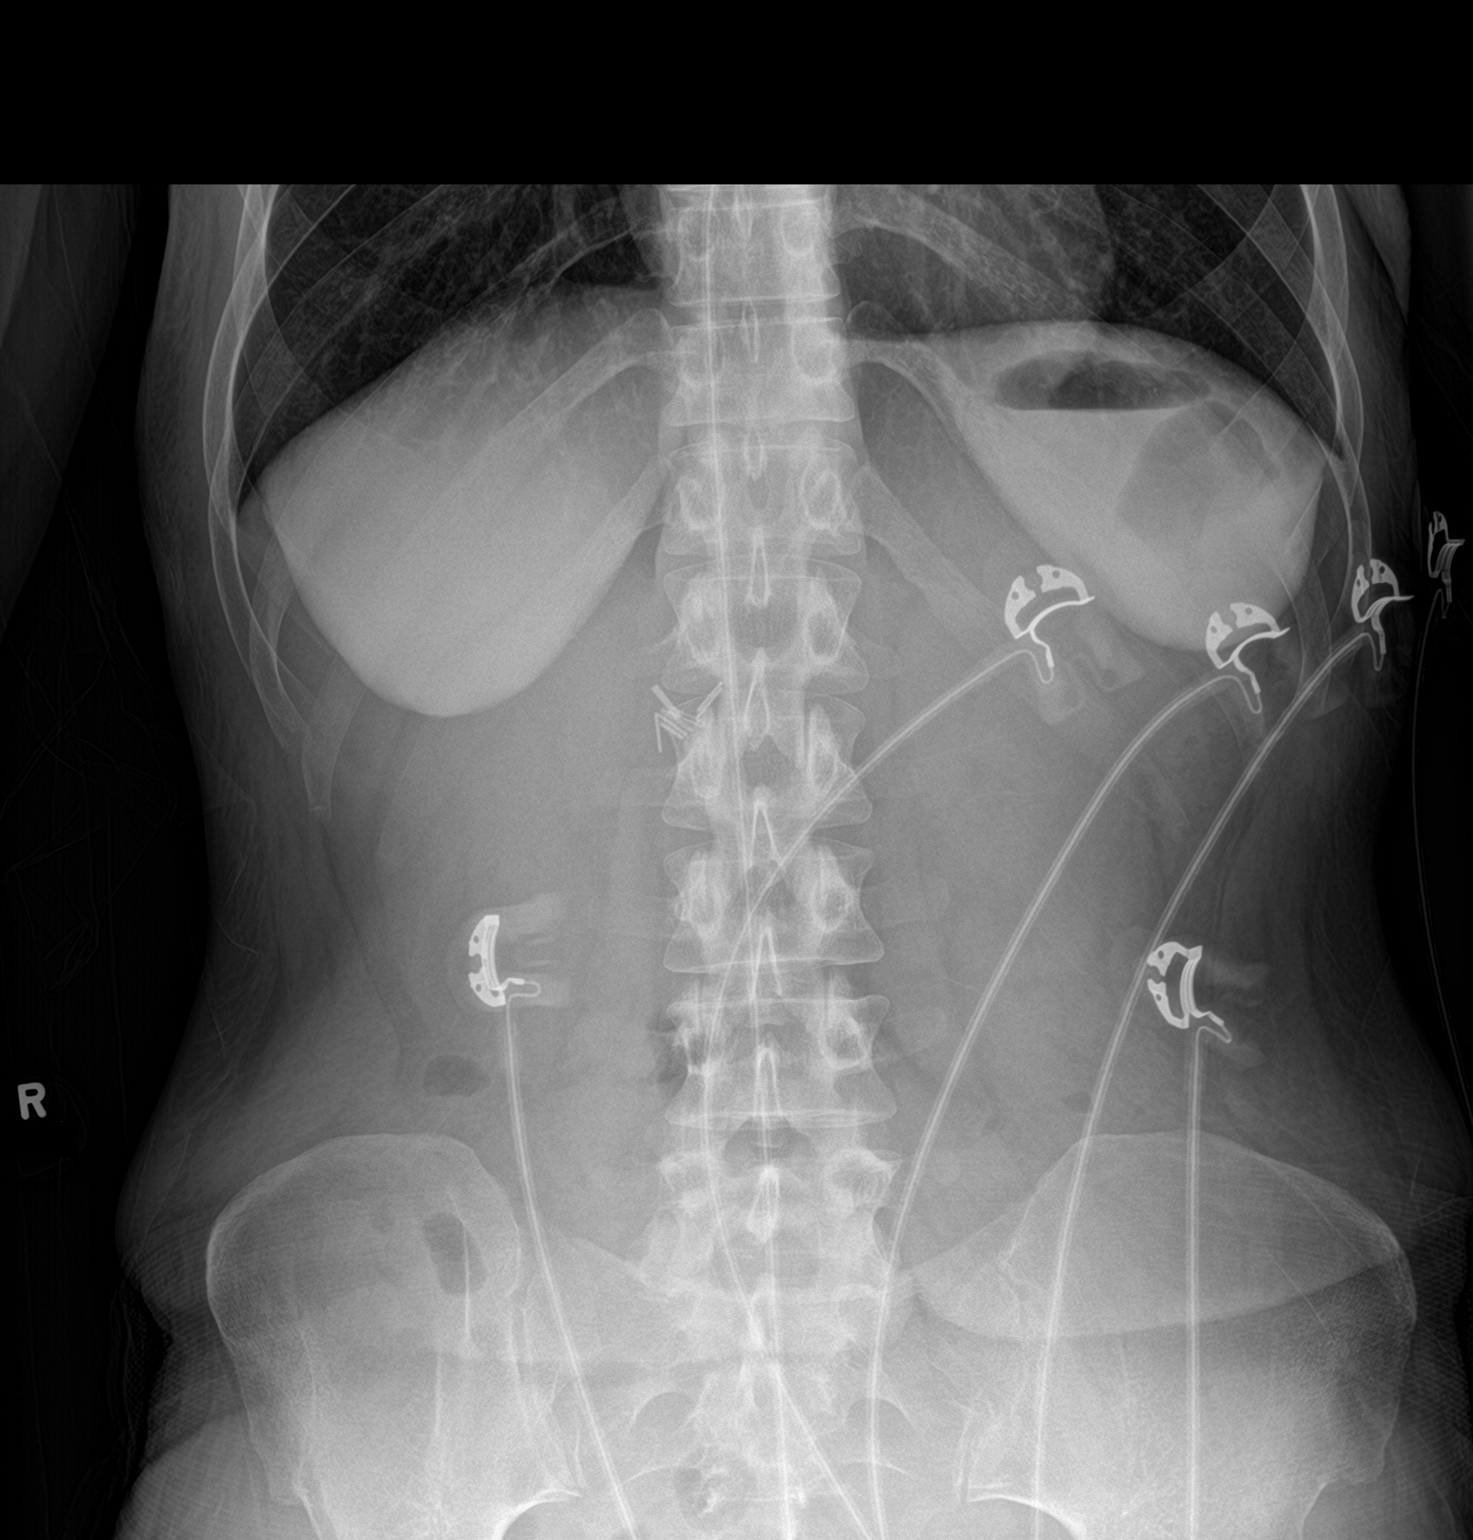

[abdomen supine]
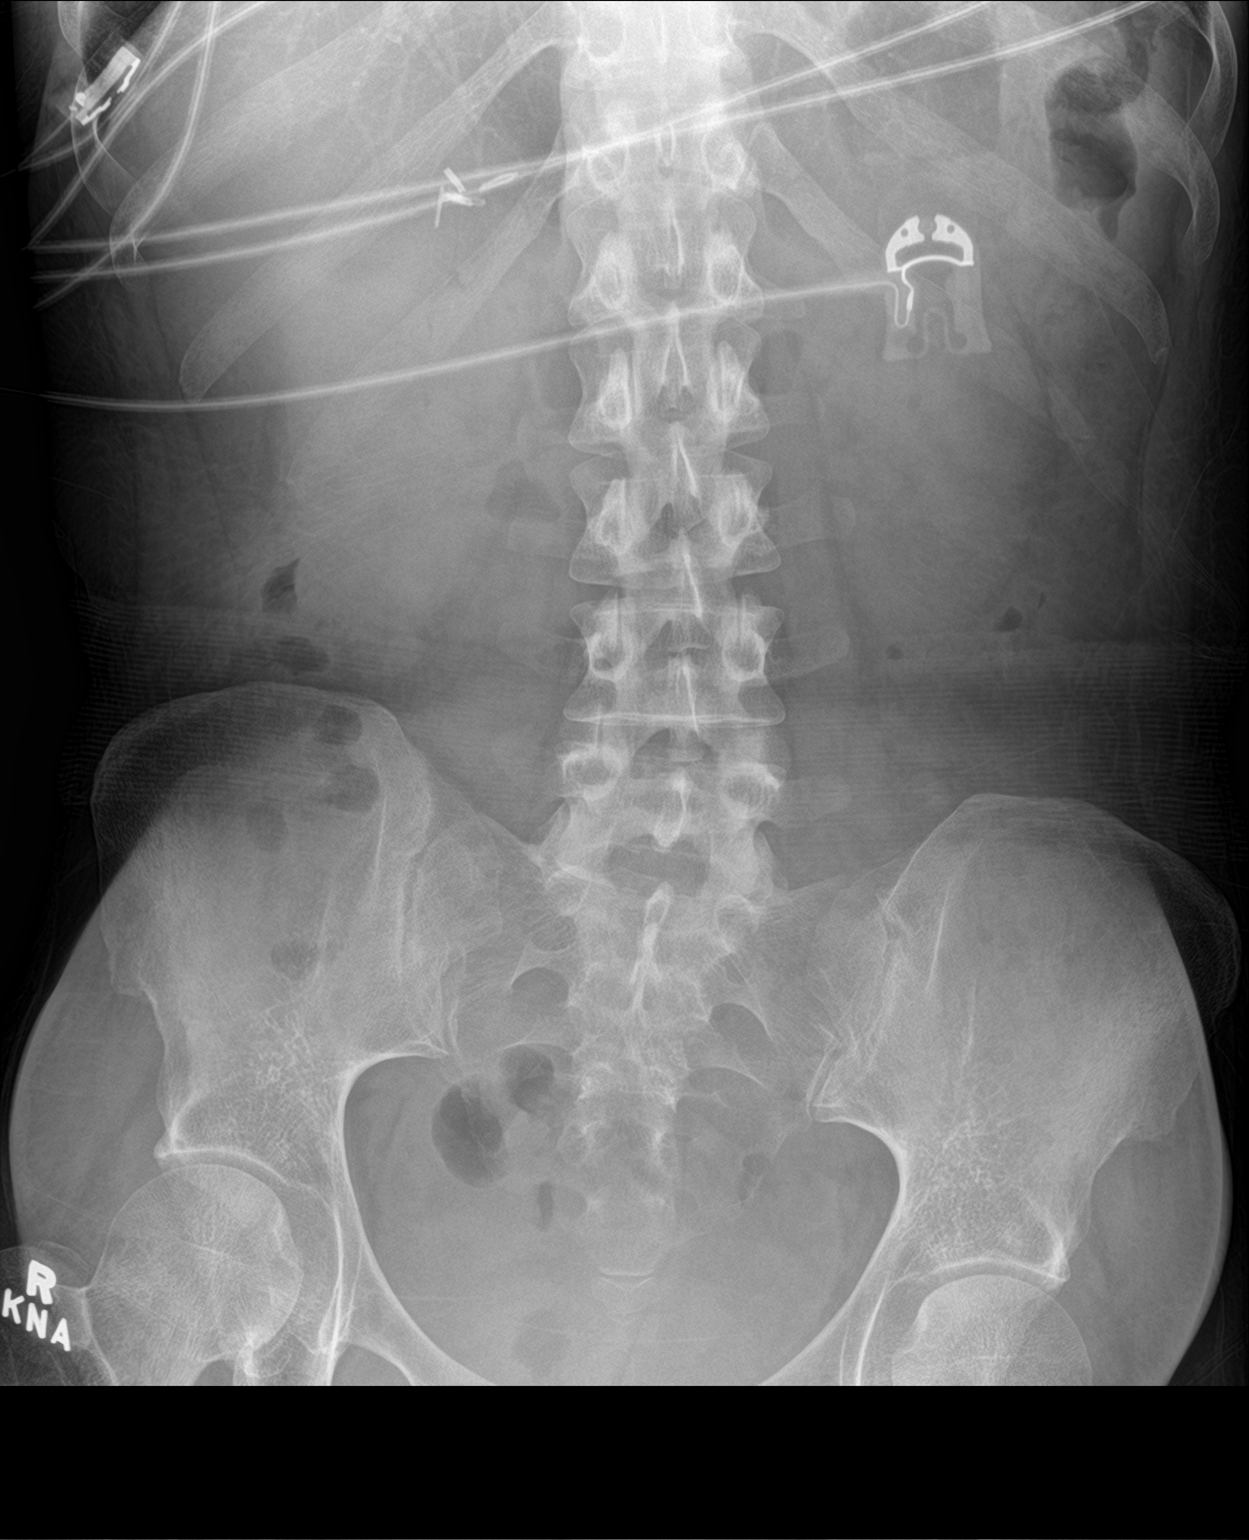

[2 of 2 positions shown; findings below may reference images not displayed]

FINDINGS: There is no bowel dilation to suggest obstruction. No air-fluid
levels and no free air. Relative paucity of bowel gas.

No evidence of renal or ureteral stones. Soft tissues are
unremarkable other than vascular clips from a prior cholecystectomy.

Skeletal structures are within normal limits.
IMPRESSION: 1. No acute findings. No evidence of bowel obstruction, generalized
adynamic ileus or free air.

## 2018-12-23 MED ORDER — METOCLOPRAMIDE HCL 5 MG/ML IJ SOLN
10.0000 mg | Freq: Three times a day (TID) | INTRAMUSCULAR | Status: DC
  Administered 2018-12-23 – 2018-12-25 (×5): 10 mg via INTRAVENOUS
  Filled 2018-12-23 (×5): qty 2

## 2018-12-23 MED ORDER — ACETAMINOPHEN 650 MG RE SUPP: 650.0000 mg | Freq: Four times a day (QID) | RECTAL | Status: DC | PRN

## 2018-12-23 MED ORDER — ACETAMINOPHEN 325 MG PO TABS: 650.0000 mg | ORAL_TABLET | Freq: Four times a day (QID) | ORAL | Status: DC | PRN

## 2018-12-23 MED ORDER — MORPHINE SULFATE (PF) 2 MG/ML IV SOLN
2.0000 mg | Freq: Once | INTRAVENOUS | Status: AC
  Administered 2018-12-23: 2 mg via INTRAVENOUS
  Filled 2018-12-23: qty 1

## 2018-12-23 MED ORDER — SODIUM CHLORIDE 0.9 % IV BOLUS (SEPSIS)
500.0000 mL | Freq: Once | INTRAVENOUS | Status: AC
  Administered 2018-12-23: 500 mL via INTRAVENOUS

## 2018-12-23 MED ORDER — DIPHENHYDRAMINE HCL 50 MG/ML IJ SOLN
25.0000 mg | Freq: Once | INTRAMUSCULAR | Status: AC
  Administered 2018-12-23: 25 mg via INTRAVENOUS
  Filled 2018-12-23: qty 1

## 2018-12-23 MED ORDER — POTASSIUM CHLORIDE 10 MEQ/100ML IV SOLN
10.0000 meq | INTRAVENOUS | Status: AC
  Administered 2018-12-23 (×2): 10 meq via INTRAVENOUS
  Filled 2018-12-23 (×3): qty 100

## 2018-12-23 MED ORDER — SODIUM CHLORIDE 0.9% FLUSH: 3.0000 mL | Freq: Once | INTRAVENOUS | Status: DC

## 2018-12-23 MED ORDER — BOOST / RESOURCE BREEZE PO LIQD CUSTOM: 1.0000 | Freq: Three times a day (TID) | ORAL | Status: DC

## 2018-12-23 MED ORDER — LORAZEPAM 2 MG/ML IJ SOLN
0.5000 mg | Freq: Two times a day (BID) | INTRAMUSCULAR | Status: DC | PRN
  Administered 2018-12-23 – 2018-12-25 (×3): 0.5 mg via INTRAVENOUS
  Filled 2018-12-23 (×3): qty 1

## 2018-12-23 MED ORDER — PANTOPRAZOLE SODIUM 40 MG IV SOLR
40.0000 mg | Freq: Two times a day (BID) | INTRAVENOUS | Status: DC
  Administered 2018-12-23 – 2018-12-25 (×4): 40 mg via INTRAVENOUS
  Filled 2018-12-23 (×4): qty 40

## 2018-12-23 MED ORDER — ENOXAPARIN SODIUM 40 MG/0.4ML ~~LOC~~ SOLN
40.0000 mg | Freq: Every day | SUBCUTANEOUS | Status: DC
  Administered 2018-12-23 – 2018-12-24 (×2): 40 mg via SUBCUTANEOUS
  Filled 2018-12-23 (×2): qty 0.4

## 2018-12-23 MED ORDER — METOPROLOL TARTRATE 25 MG PO TABS
25.0000 mg | ORAL_TABLET | Freq: Two times a day (BID) | ORAL | Status: DC
  Administered 2018-12-23 – 2018-12-25 (×4): 25 mg via ORAL
  Filled 2018-12-23 (×4): qty 1

## 2018-12-23 MED ORDER — INSULIN GLARGINE 100 UNIT/ML ~~LOC~~ SOLN
12.0000 [IU] | Freq: Every day | SUBCUTANEOUS | Status: DC
  Administered 2018-12-23 – 2018-12-24 (×2): 12 [IU] via SUBCUTANEOUS
  Filled 2018-12-23 (×4): qty 0.12

## 2018-12-23 MED ORDER — INSULIN ASPART 100 UNIT/ML ~~LOC~~ SOLN
0.0000 [IU] | Freq: Four times a day (QID) | SUBCUTANEOUS | Status: DC
  Administered 2018-12-23: 7 [IU] via SUBCUTANEOUS
  Administered 2018-12-24 (×3): 2 [IU] via SUBCUTANEOUS
  Administered 2018-12-25: 3 [IU] via SUBCUTANEOUS
  Administered 2018-12-25: 2 [IU] via SUBCUTANEOUS

## 2018-12-23 MED ORDER — ALBUTEROL SULFATE (2.5 MG/3ML) 0.083% IN NEBU: 2.5000 mg | INHALATION_SOLUTION | Freq: Four times a day (QID) | RESPIRATORY_TRACT | Status: DC | PRN

## 2018-12-23 MED ORDER — HYDROMORPHONE HCL 1 MG/ML IJ SOLN
1.0000 mg | Freq: Once | INTRAMUSCULAR | Status: AC
  Administered 2018-12-23: 1 mg via INTRAVENOUS
  Filled 2018-12-23: qty 1

## 2018-12-23 MED ORDER — ONDANSETRON HCL 4 MG PO TABS: 4.0000 mg | ORAL_TABLET | Freq: Four times a day (QID) | ORAL | Status: DC | PRN

## 2018-12-23 MED ORDER — ONDANSETRON HCL 4 MG/2ML IJ SOLN
4.0000 mg | Freq: Four times a day (QID) | INTRAMUSCULAR | Status: DC | PRN
  Administered 2018-12-23: 4 mg via INTRAVENOUS
  Filled 2018-12-23: qty 2

## 2018-12-23 MED ORDER — HYDROMORPHONE HCL 1 MG/ML IJ SOLN: 1.0000 mg | Freq: Once | INTRAMUSCULAR | Status: DC

## 2018-12-23 MED ORDER — SODIUM CHLORIDE 0.9 % IV BOLUS: 500.0000 mL | Freq: Once | INTRAVENOUS | Status: DC

## 2018-12-23 MED ORDER — HALOPERIDOL LACTATE 5 MG/ML IJ SOLN
2.0000 mg | Freq: Once | INTRAMUSCULAR | Status: AC
  Administered 2018-12-23: 2 mg via INTRAVENOUS
  Filled 2018-12-23: qty 1

## 2018-12-23 MED ORDER — SODIUM CHLORIDE 0.9 % IV SOLN
Freq: Once | INTRAVENOUS | Status: AC
  Administered 2018-12-23: 17:00:00 via INTRAVENOUS

## 2018-12-23 MED ORDER — ENOXAPARIN SODIUM 40 MG/0.4ML ~~LOC~~ SOLN: 40.0000 mg | SUBCUTANEOUS | Status: DC

## 2018-12-23 MED ORDER — SODIUM CHLORIDE 0.9 % IV BOLUS
2000.0000 mL | Freq: Once | INTRAVENOUS | Status: AC
  Administered 2018-12-23: 2000 mL via INTRAVENOUS

## 2018-12-23 MED ORDER — MORPHINE SULFATE (PF) 2 MG/ML IV SOLN
2.0000 mg | INTRAVENOUS | Status: DC | PRN
  Administered 2018-12-23 – 2018-12-24 (×4): 2 mg via INTRAVENOUS
  Filled 2018-12-23 (×4): qty 1

## 2018-12-23 MED ORDER — ONDANSETRON HCL 4 MG/2ML IJ SOLN
4.0000 mg | Freq: Once | INTRAMUSCULAR | Status: AC
  Administered 2018-12-23: 4 mg via INTRAVENOUS
  Filled 2018-12-23: qty 2

## 2018-12-23 MED ORDER — LABETALOL HCL 5 MG/ML IV SOLN
5.0000 mg | INTRAVENOUS | Status: DC | PRN
  Administered 2018-12-23: 5 mg via INTRAVENOUS
  Filled 2018-12-23: qty 4

## 2018-12-23 MED ORDER — INSULIN ASPART 100 UNIT/ML ~~LOC~~ SOLN
10.0000 [IU] | Freq: Once | SUBCUTANEOUS | Status: AC
  Administered 2018-12-23: 10 [IU] via INTRAVENOUS

## 2018-12-23 MED ORDER — HYDRALAZINE HCL 50 MG PO TABS
50.0000 mg | ORAL_TABLET | Freq: Four times a day (QID) | ORAL | Status: DC
  Administered 2018-12-23 – 2018-12-25 (×8): 50 mg via ORAL
  Filled 2018-12-23 (×7): qty 1
  Filled 2018-12-23: qty 2

## 2018-12-23 MED ORDER — PROMETHAZINE HCL 25 MG/ML IJ SOLN
12.5000 mg | Freq: Four times a day (QID) | INTRAMUSCULAR | Status: DC | PRN
  Administered 2018-12-23 – 2018-12-25 (×3): 12.5 mg via INTRAVENOUS
  Filled 2018-12-23 (×3): qty 1

## 2018-12-23 MED ORDER — MIRTAZAPINE 15 MG PO TABS
30.0000 mg | ORAL_TABLET | Freq: Every day | ORAL | Status: DC
  Administered 2018-12-23 – 2018-12-24 (×2): 30 mg via ORAL
  Filled 2018-12-23: qty 1
  Filled 2018-12-23 (×2): qty 2

## 2018-12-23 MED ORDER — DICYCLOMINE HCL 20 MG PO TABS
20.0000 mg | ORAL_TABLET | Freq: Three times a day (TID) | ORAL | Status: DC
  Administered 2018-12-23 – 2018-12-25 (×7): 20 mg via ORAL
  Filled 2018-12-23 (×7): qty 1

## 2018-12-23 MED ORDER — SODIUM CHLORIDE 0.9 % IV SOLN
INTRAVENOUS | Status: DC
  Administered 2018-12-23 – 2018-12-24 (×2): via INTRAVENOUS

## 2018-12-23 NOTE — ED Triage Notes (Signed)
C/o nausea and vomiting with diarrhea , back pain onset 1 week ago

## 2018-12-23 NOTE — H&P (Addendum)
History and Physical    [name] EGB:[id number] DOB: Apr 26, 1990 DOA: 12/23/2018  Referring MD/NP/PA: Alvester Chou, MD PCP: Ardith Dark, MD  Patient coming from: home  Chief Complaint: Abdominal pain with nausea and vomiting  I have personally briefly reviewed patient's old medical records in [facility]   HPI: [name] is a 28 y.o. female with medical history significant of insulin-dependent diabetes mellitus with gastroparesis, hypertension, anxiety, depression, fibromyalgia, GERD; who presents with complaints of 1 week of epigastric abdominal pain with nausea and vomiting.  Pain is sharp and constant.  Emesis has been brown in color, but she denies any reports of blood.  She has been unable to tolerate any food or liquids.  She has not been taking her insulin like usual due to inability to eat.  Associated symptoms include sharp lower back pain, some chest discomfort, and diarrhea.  Denies any recent fall/trauma, significant fever, cough, shortness of breath.  Tried home medications of tramadol without relief of pain symptoms.  Patient has had similar admissions like this in the past and reports usually requiring IV pain medication and Phenergan to relieve symptoms.  Last admitted from 7/21 through 7/25 patient had CT scan which showed mild thickening of the colon as noted on previous scans.  She was not treated with antibiotics and improved with symptomatic treatments.  ED Course: Upon admission to the emergency department patient was seen to be afebrile, pulse 10 1-120, respiration 20-34, and all other vital signs maintained.  Initial labs revealed WBC 4.2, hemoglobin 10.9, sodium 132, BUN 22, creatinine 1.46, glucose 679, lipase 24, and anion gap 18.  Patient was given 2.5 L of normal saline IV fluids, 10 units of insulin, Benadryl, antiemetics, and 20 mEq of potassium chloride.  Repeat labs revealed sodium 137, BUN 20, creatinine 1.16, glucose 268, and anion gap 12.   Patient continued to have persistent nausea and vomiting.  TRH called to admit.  Review of Systems  Constitutional: Positive for malaise/fatigue. Negative for chills and fever.  HENT: Negative for congestion and sinus pain.   Eyes: Negative for double vision and photophobia.  Respiratory: Negative for cough, sputum production and shortness of breath.   Cardiovascular: Positive for chest pain. Negative for claudication and leg swelling.  Gastrointestinal: Positive for abdominal pain, diarrhea, nausea and vomiting.  Genitourinary: Negative for dysuria and hematuria.  Musculoskeletal: Positive for back pain. Negative for falls.  Skin: Negative for rash.  Neurological: Negative for focal weakness and loss of consciousness.  Psychiatric/Behavioral: Negative for substance abuse and suicidal ideas.    Past Medical History:  Diagnosis Date  . Allergy   . Anemia   . Anxiety   . Blood transfusion without reported diagnosis    Dec 2018  . Cataract    right eye  . Depression   . Diabetes type 1, uncontrolled (HCC) 11/14/2011   Since age 39  . Fibromyalgia   . Gastroparesis   . GERD (gastroesophageal reflux disease)   . Hypertension   . Infection    UTI April 2016    Past Surgical History:  Procedure Laterality Date  . ANKLE SURGERY    . CHOLECYSTECTOMY  11/15/2011   Procedure: LAPAROSCOPIC CHOLECYSTECTOMY WITH INTRAOPERATIVE CHOLANGIOGRAM;  Surgeon: [name], MD;  Location: [facility];  Service: General;  Laterality: N/A;  . COLONOSCOPY    . COLONOSCOPY WITH PROPOFOL N/A 06/27/2017   Procedure: COLONOSCOPY WITH PROPOFOL;  Surgeon: Rachael Fee, MD;  Location: [facility];  Service: Endoscopy;  Laterality: N/A;  . ESOPHAGOGASTRODUODENOSCOPY  12/03/2011   Procedure: ESOPHAGOGASTRODUODENOSCOPY (EGD);  Surgeon: Theda Belfast, MD;  Location: [facility];  Service: Endoscopy;  Laterality: N/A;  . FLEXIBLE SIGMOIDOSCOPY N/A 03/10/2017   Procedure: FLEXIBLE SIGMOIDOSCOPY;  Surgeon:  Jeani Hawking, MD;  Location: [facility];  Service: Endoscopy;  Laterality: N/A;  . INCISION AND DRAINAGE PERIRECTAL ABSCESS N/A 03/01/2017   Procedure: IRRIGATION AND DEBRIDEMENT PERIRECTAL ABSCESS;  Surgeon: Ovidio Kin, MD;  Location: [facility];  Service: General;  Laterality: N/A;  . IRRIGATION AND DEBRIDEMENT BUTTOCKS N/A 03/23/2017   Procedure: IRRIGATION AND DEBRIDEMENT BUTTOCKS, SETON PLACEMENT;  Surgeon: Romie Levee, MD;  Location: [facility];  Service: General;  Laterality: N/A;  . LAPAROSCOPY  11/23/2011   Procedure: LAPAROSCOPY DIAGNOSTIC;  Surgeon: Mariella Saa, MD;  Location: [facility];  Service: General;  Laterality: N/A;  . SIGMOIDOSCOPY    . UPPER GASTROINTESTINAL ENDOSCOPY    . WISDOM TOOTH EXTRACTION       reports that she has never smoked. She has never used smokeless tobacco. She reports that she does not drink alcohol or use drugs.  Allergies  Allergen Reactions  . Other Anaphylaxis    Reaction to [place] nuts   . Lactose Intolerance (Gi) Diarrhea    Family History  Problem Relation Age of Onset  . Diabetes Mother   . Hypertension Father   . Colon cancer Paternal Grandmother        pt thinks PGM was dx in her 45's  . Diabetes Paternal Grandmother   . Diabetes Maternal Grandmother   . Diabetes Maternal Grandfather   . Diabetes Paternal Grandfather   . Diabetes Other   . Esophageal cancer Neg Hx   . Liver cancer Neg Hx   . Pancreatic cancer Neg Hx   . Stomach cancer Neg Hx   . Rectal cancer Neg Hx     Prior to Admission medications   Medication Sig Start Date End Date Taking? Authorizing Provider  dicyclomine (BENTYL) 20 MG tablet Take 1 tablet (20 mg total) by mouth 4 (four) times daily -  before meals and at bedtime. 09/21/18  Yes Glade Lloyd, MD  furosemide (LASIX) 40 MG tablet TAKE 1 TABLET BY MOUTH  DAILY Patient taking differently: Take 40 mg by mouth daily.  11/11/18  Yes Armbruster, Willaim Rayas, MD  hydrALAZINE (APRESOLINE) 50 MG tablet Take 1  tablet (50 mg total) by mouth every 6 (six) hours. 10/23/18  Yes Swayze, Ava, DO  insulin lispro (HUMALOG) 100 UNIT/ML injection Inject 0-0.1 mLs (0-10 Units total) into the skin See admin instructions. Inject 0-10 units subcutaneously three times daily with meals per sliding scale Patient taking differently: Inject 0-10 Units into the skin See admin instructions. Inject 5 units subcutaneously three times daily with meals per sliding scale 09/21/18  Yes Alekh, Kshitiz, MD  LANTUS 100 UNIT/ML injection Inject 0.08 mLs (8 Units total) into the skin at bedtime. Patient using Vials Patient taking differently: Inject 12 Units into the skin at bedtime. Patient using Vials 09/21/18  Yes Glade Lloyd, MD  LORazepam (ATIVAN) 0.5 MG tablet Take 1 tablet (0.5 mg total) by mouth 2 (two) times daily as needed for anxiety. 10/28/18  Yes Ardith Dark, MD  metoCLOPramide (REGLAN) 10 MG tablet Take 1 tablet (10 mg total) by mouth every 6 (six) hours as needed for nausea or vomiting. 02/20/18  Yes Burnadette Pop, MD  metoprolol tartrate (LOPRESSOR) 25 MG tablet Take 1 tablet (25 mg total) by  mouth 2 (two) times daily. 10/23/18  Yes Swayze, Ava, DO  mirtazapine (REMERON) 30 MG tablet Take 1 tablet (30 mg total) by mouth at bedtime. 05/31/18  Yes Ardith DarkParker, Caleb M, MD  ondansetron (ZOFRAN) 4 MG tablet Take 1 tablet (4 mg total) by mouth every 8 (eight) hours as needed for nausea or vomiting (If necessary, dont combine with Reglan.). 09/21/18  Yes Glade LloydAlekh, Kshitiz, MD  pantoprazole (PROTONIX) 40 MG tablet TAKE 1 TABLET BY MOUTH  DAILY Patient taking differently: Take 40 mg by mouth daily.  10/22/18  Yes Armbruster, Willaim RayasSteven P, MD  potassium chloride 20 MEQ TBCR Take 20 mEq by mouth 2 (two) times daily. 10/17/18  Yes Jacalyn LefevreHaviland, Julie, MD  promethazine (PHENERGAN) 12.5 MG tablet Take 1 tablet (12.5 mg total) by mouth every 8 (eight) hours as needed for nausea. 11/22/18  Yes Armbruster, Willaim RayasSteven P, MD  promethazine (PHENERGAN) 25 MG  suppository Place 1 suppository (25 mg total) rectally every 8 (eight) hours as needed for nausea or vomiting. 12/17/18  Yes Armbruster, Willaim RayasSteven P, MD  traMADol (ULTRAM) 50 MG tablet Take 1 tablet (50 mg total) by mouth every 6 (six) hours as needed for moderate pain or severe pain. 09/21/18  Yes Glade LloydAlekh, Kshitiz, MD  Continuous Blood Gluc Receiver (DEXCOM G6 RECEIVER) DEVI USE AS DIRECTED TO MONITOR GLUCOSE LEVELS 10/29/18   [provider]  Continuous Blood Gluc Transmit (DEXCOM G6 TRANSMITTER) MISC USE AS DIRECTED TO MONITOR GLUCOSE LEVELS 10/29/18   [provider]  diclofenac (VOLTAREN) 75 MG EC tablet Take 1 tablet (75 mg total) by mouth daily. Patient not taking: Reported on 11/27/2018 09/21/18   Glade LloydAlekh, Kshitiz, MD    Physical Exam:  Constitutional: Young female who appears to be in moderate distress.  Emesis present with brown color and food content. Vitals:   12/23/18 1312 12/23/18 1355 12/23/18 1400 12/23/18 1430  BP:  (!) 151/111 (!) 156/109 (!) 154/103  Pulse: (!) 111     Resp: (!) 24 18 19  (!) 27  Temp:      TempSrc:      SpO2: 99%     Weight:      Height:       Eyes: PERRL, lids and conjunctivae normal ENMT: Mucous membranes are dry. Posterior pharynx clear of any exudate or lesions.  Neck: normal, supple, no masses, no thyromegaly Respiratory: clear to auscultation bilaterally, no wheezing, no crackles. Normal respiratory effort. No accessory muscle use.  Cardiovascular: Tachycardic, no murmurs / rubs / gallops. No extremity edema. 2+ pedal pulses. No carotid bruits.  Abdomen: Epigastric tenderness is present Musculoskeletal: no clubbing / cyanosis. No joint deformity upper and lower extremities. Good ROM, no contractures. Normal muscle tone.  Skin: no rashes, lesions, ulcers. No induration Neurologic: CN 2-12 grossly intact. Sensation intact, DTR normal. Strength 5/5 in all 4.  Psychiatric: Normal judgment and insight. Alert and oriented x 3. Normal mood.      Labs on Admission: I have personally reviewed following labs and imaging studies  CBC: Recent Labs  Lab 12/22/18 1759 12/23/18 0516  WBC 3.9* 4.2  HGB 10.3* 10.9*  HCT 31.3* 31.5*  MCV 85.1 83.3  PLT 271 281   Basic Metabolic Panel: Recent Labs  Lab 12/22/18 1759 12/23/18 0516 12/23/18 1134  NA 133* 132* 137  K 3.2* 3.6 4.3  CL 88* 83* 97*  CO2 26 31 28   GLUCOSE 612* 679* 268*  BUN 21* 22* 20  CREATININE 1.46* 1.46* 1.16*  CALCIUM 9.4 9.5 8.6*  GFR: Estimated Creatinine Clearance: 56.4 mL/min (A) (by C-G formula based on SCr of 1.16 mg/dL (H)). Liver Function Tests: Recent Labs  Lab 12/22/18 1759 12/23/18 0516  AST 13* 16  ALT 13 14  ALKPHOS 91 96  BILITOT 0.5 0.7  PROT 7.0 7.5  ALBUMIN 3.5 3.7   Recent Labs  Lab 12/22/18 1759 12/23/18 0516  LIPASE 21 24   No results for input(s): AMMONIA in the last 168 hours. Coagulation Profile: No results for input(s): INR, PROTIME in the last 168 hours. Cardiac Enzymes: No results for input(s): CKTOTAL, CKMB, CKMBINDEX, TROPONINI in the last 168 hours. BNP (last 3 results) No results for input(s): PROBNP in the last 8760 hours. HbA1C: No results for input(s): HGBA1C in the last 72 hours. CBG: No results for input(s): GLUCAP in the last 168 hours. Lipid Profile: No results for input(s): CHOL, HDL, LDLCALC, TRIG, CHOLHDL, LDLDIRECT in the last 72 hours. Thyroid Function Tests: No results for input(s): TSH, T4TOTAL, FREET4, T3FREE, THYROIDAB in the last 72 hours. Anemia Panel: No results for input(s): VITAMINB12, FOLATE, FERRITIN, TIBC, IRON, RETICCTPCT in the last 72 hours. Urine analysis:    Component Value Date/Time   COLORURINE YELLOW 12/23/2018 1309   APPEARANCEUR CLEAR 12/23/2018 1309   LABSPEC 1.021 12/23/2018 1309   PHURINE 6.0 12/23/2018 1309   GLUCOSEU >=500 (A) 12/23/2018 1309   HGBUR SMALL (A) 12/23/2018 1309   BILIRUBINUR NEGATIVE 12/23/2018 1309   BILIRUBINUR negative 01/02/2015 1717    KETONESUR 5 (A) 12/23/2018 1309   PROTEINUR 30 (A) 12/23/2018 1309   UROBILINOGEN 0.2 01/13/2015 0223   NITRITE NEGATIVE 12/23/2018 1309   LEUKOCYTESUR NEGATIVE 12/23/2018 1309   Sepsis Labs: No results found for this or any previous visit (from the past 240 hour(s)).   Radiological Exams on Admission: Dg Chest Portable 1 View  Result Date: 12/23/2018 CLINICAL DATA:  Chest pain and weakness for 3 days. EXAM: PORTABLE CHEST 1 VIEW COMPARISON:  11/24/2018 FINDINGS: The heart size and mediastinal contours are within normal limits. Both lungs are clear. The visualized skeletal structures are unremarkable. IMPRESSION: No active disease. Electronically Signed   By: Danae Orleans M.D.   On: 12/23/2018 08:06    EKG: Independently reviewed.  103 bpm with QTc 449.  Assessment/Plan Nausea and vomiting suspect secondary to diabetic gastroparesis: Acute on chronic.  Patient presents with 5 days of nausea, vomiting, and epigastric abdominal pain. Suspect secondary to history of gastroparesis similar presentation previously in the past. -Admit to a medical telemetry bed -Antiemetics as needed -Reglan 10 mg IV 3 times daily -Normal saline at 100 ml/hr -Check abdominal x-ray  DKA type I: Resolved.  Patient initially presented in DKA, but had reportedly not been taking insulin as advised with nausea and vomiting symptoms.  Initial glucose 600 with anion gap 18.  Last hemoglobin A1c 9.6 on 09/20/2018. -Hypoglycemic protocol -Continue home Lantus 12 units nightly -CBGs every 6 hours with sensitive SSI -Adjust insulin regimen as needed -Likely to benefit from continued diabetic education  Diarrhea: Patient ports having episodes of diarrhea as well. -Strict intake and output -May warrant CT imaging of the abdomen and pelvis  Essential hypertension: Blood pressures elevated up to 170/18 on admission. -Continue home medications of hydralazine and metoprolol -Labetalol IV as needed  Acute kidney injury:  Resolving.  On admission patient's creatinine initially elevated up to 1.46 with BUN 22.  Baseline creatinine previously noted to be within normal limits.  Given reports of nausea, vomiting, and diarrhea suspect prerenal in  nature. -Continue IV fluids -Recheck creatinine in a.m.  Normocytic normochromic anemia: Chronic.  Hemoglobin 10.9 g/dL on admission.  Baseline hemoglobin appears to range from 10-11. -Continue to monitor  Lumbar back pain, history of fibromyalgia: Patient denies any reports of any recent trauma or fall.  Normally on tramadol pain at home. -IV morphine as needed for pain overnight given intractable nausea and vomiting  Anxiety -Changed Ativan to IV as needed  GERD -Changed Protonix to IV BID  COVID-19 screening pending  DVT prophylaxis: lovenox Code Status: Full  Family Communication: No family present at bedside. Disposition Plan: likely discharge home in 2-3 days Consults called: none  Admission status: observation  Norval Morton MD [facility] Pager [phone]   If 7PM-7AM, please contact night-coverage [email or web address] Password [facility]  12/23/2018, 4:15 PM

## 2018-12-23 NOTE — ED Notes (Signed)
Pt continues to have nausea. She has been provided 3 cups of ice to wet her mouth. She spits the contents into a basin. She c/o pain in her upper back r/t vomiting. New orders have been verified.

## 2018-12-23 NOTE — ED Provider Notes (Signed)
[facility] EMERGENCY DEPARTMENT Provider Note   CSN: [id number] Arrival date & time: 12/23/18  0446     History   Chief Complaint Chief Complaint  Patient presents with  . Abdominal Pain    HPI [name] is a 28 y.o. female w/ pmhx of IDDM presenting to the ED with n/v and abdominal pain.  She reports onset of symptoms about 1 week ago.  States she has a hx of gastroparesis and this feels like her usual symptoms.  Unable to tolerate PO fluids or food for the past week.  She has not taken her insulin last week because she hasn't eaten.  Typically takes 12 units lantus at night, 5 units short-acting insulin TID before meals  No allergies  Denies dysuria, cough, fevers, chills, sore throat, sick contacts at home.     HPI  Past Medical History:  Diagnosis Date  . Allergy   . Anemia   . Anxiety   . Blood transfusion without reported diagnosis    Dec 2018  . Cataract    right eye  . Depression   . Diabetes type 1, uncontrolled (HCC) 11/14/2011   Since age 30  . Fibromyalgia   . Gastroparesis   . GERD (gastroesophageal reflux disease)   . Hypertension   . Infection    UTI April 2016    Patient Active Problem List   Diagnosis Date Noted  . Diabetic gastroparesis (HCC) 10/19/2018  . DKA, type 1 (HCC) 09/18/2018  . Pneumonia 08/20/2018  . Contraception management 08/06/2018  . Intractable vomiting with nausea 07/30/2018  . Intractable vomiting   . Cyclical vomiting syndrome 06/07/2018  . Diabetic gastroparesis associated with type 1 diabetes mellitus (HCC) 06/04/2018  . Anxiety 05/31/2018  . Peripheral edema 05/31/2018  . Acute kidney injury (HCC) 03/22/2018  . Normocytic anemia 07/10/2017  . Hypokalemia 06/23/2017  . GERD (gastroesophageal reflux disease) 06/23/2017  . Intractable nausea and vomiting 04/11/2017  . 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 Diabetes mellitus type 1 (HCC) 04/28/2013  .  Protein-calorie malnutrition, severe (HCC) 04/28/2013  . Hypertension associated with diabetes (HCC) 11/14/2011  . Chronic abdominal pain 09/18/2011    Past Surgical History:  Procedure Laterality Date  . ANKLE SURGERY    . CHOLECYSTECTOMY  11/15/2011   Procedure: LAPAROSCOPIC CHOLECYSTECTOMY WITH INTRAOPERATIVE CHOLANGIOGRAM;  Surgeon: [name], MD;  Location: [facility];  Service: General;  Laterality: N/A;  . COLONOSCOPY    . COLONOSCOPY WITH PROPOFOL N/A 06/27/2017   Procedure: COLONOSCOPY WITH PROPOFOL;  Surgeon: Rachael Fee, MD;  Location: [facility];  Service: Endoscopy;  Laterality: N/A;  . ESOPHAGOGASTRODUODENOSCOPY  12/03/2011   Procedure: ESOPHAGOGASTRODUODENOSCOPY (EGD);  Surgeon: Theda Belfast, MD;  Location: [facility];  Service: Endoscopy;  Laterality: N/A;  . FLEXIBLE SIGMOIDOSCOPY N/A 03/10/2017   Procedure: FLEXIBLE SIGMOIDOSCOPY;  Surgeon: Jeani Hawking, MD;  Location: [facility];  Service: Endoscopy;  Laterality: N/A;  . INCISION AND DRAINAGE PERIRECTAL ABSCESS N/A 03/01/2017   Procedure: IRRIGATION AND DEBRIDEMENT PERIRECTAL ABSCESS;  Surgeon: Ovidio Kin, MD;  Location: [facility];  Service: General;  Laterality: N/A;  . IRRIGATION AND DEBRIDEMENT BUTTOCKS N/A 03/23/2017   Procedure: IRRIGATION AND DEBRIDEMENT BUTTOCKS, SETON PLACEMENT;  Surgeon: Romie Levee, MD;  Location: [facility];  Service: General;  Laterality: N/A;  . LAPAROSCOPY  11/23/2011   Procedure: LAPAROSCOPY DIAGNOSTIC;  Surgeon: Mariella Saa, MD;  Location: [facility];  Service: General;  Laterality: N/A;  . SIGMOIDOSCOPY    .  UPPER GASTROINTESTINAL ENDOSCOPY    . WISDOM TOOTH EXTRACTION       OB History    Gravida  1   Para  1   Term      Preterm  1   AB      Living  1     SAB      TAB      Ectopic      Multiple  0   Live Births  1            Home Medications    Prior to Admission medications   Medication Sig Start Date End Date Taking? Authorizing Provider   dicyclomine (BENTYL) 20 MG tablet Take 1 tablet (20 mg total) by mouth 4 (four) times daily -  before meals and at bedtime. 09/21/18  Yes Glade Lloyd, MD  furosemide (LASIX) 40 MG tablet TAKE 1 TABLET BY MOUTH  DAILY Patient taking differently: Take 40 mg by mouth daily.  11/11/18  Yes Armbruster, Willaim Rayas, MD  hydrALAZINE (APRESOLINE) 50 MG tablet Take 1 tablet (50 mg total) by mouth every 6 (six) hours. 10/23/18  Yes Swayze, Ava, DO  insulin lispro (HUMALOG) 100 UNIT/ML injection Inject 0-0.1 mLs (0-10 Units total) into the skin See admin instructions. Inject 0-10 units subcutaneously three times daily with meals per sliding scale Patient taking differently: Inject 0-10 Units into the skin See admin instructions. Inject 5 units subcutaneously three times daily with meals per sliding scale 09/21/18  Yes Alekh, Kshitiz, MD  LANTUS 100 UNIT/ML injection Inject 0.08 mLs (8 Units total) into the skin at bedtime. Patient using Vials Patient taking differently: Inject 12 Units into the skin at bedtime. Patient using Vials 09/21/18  Yes Glade Lloyd, MD  LORazepam (ATIVAN) 0.5 MG tablet Take 1 tablet (0.5 mg total) by mouth 2 (two) times daily as needed for anxiety. 10/28/18  Yes Ardith Dark, MD  metoCLOPramide (REGLAN) 10 MG tablet Take 1 tablet (10 mg total) by mouth every 6 (six) hours as needed for nausea or vomiting. 02/20/18  Yes Burnadette Pop, MD  metoprolol tartrate (LOPRESSOR) 25 MG tablet Take 1 tablet (25 mg total) by mouth 2 (two) times daily. 10/23/18  Yes Swayze, Ava, DO  mirtazapine (REMERON) 30 MG tablet Take 1 tablet (30 mg total) by mouth at bedtime. 05/31/18  Yes Ardith Dark, MD  ondansetron (ZOFRAN) 4 MG tablet Take 1 tablet (4 mg total) by mouth every 8 (eight) hours as needed for nausea or vomiting (If necessary, dont combine with Reglan.). 09/21/18  Yes Glade Lloyd, MD  pantoprazole (PROTONIX) 40 MG tablet TAKE 1 TABLET BY MOUTH  DAILY Patient taking differently: Take 40 mg by  mouth daily.  10/22/18  Yes Armbruster, Willaim Rayas, MD  potassium chloride 20 MEQ TBCR Take 20 mEq by mouth 2 (two) times daily. 10/17/18  Yes Jacalyn Lefevre, MD  promethazine (PHENERGAN) 12.5 MG tablet Take 1 tablet (12.5 mg total) by mouth every 8 (eight) hours as needed for nausea. 11/22/18  Yes Armbruster, Willaim Rayas, MD  promethazine (PHENERGAN) 25 MG suppository Place 1 suppository (25 mg total) rectally every 8 (eight) hours as needed for nausea or vomiting. 12/17/18  Yes Armbruster, Willaim Rayas, MD  traMADol (ULTRAM) 50 MG tablet Take 1 tablet (50 mg total) by mouth every 6 (six) hours as needed for moderate pain or severe pain. 09/21/18  Yes Glade Lloyd, MD  Continuous Blood Gluc Receiver (DEXCOM G6 RECEIVER) DEVI USE AS DIRECTED TO MONITOR GLUCOSE  LEVELS 10/29/18   [provider]  Continuous Blood Gluc Transmit (DEXCOM G6 TRANSMITTER) MISC USE AS DIRECTED TO MONITOR GLUCOSE LEVELS 10/29/18   [provider]  diclofenac (VOLTAREN) 75 MG EC tablet Take 1 tablet (75 mg total) by mouth daily. Patient not taking: Reported on 11/27/2018 09/21/18   Aline August, MD    Family History Family History  Problem Relation Age of Onset  . Diabetes Mother   . Hypertension Father   . Colon cancer Paternal Grandmother        pt thinks PGM was dx in her 47's  . Diabetes Paternal Grandmother   . Diabetes Maternal Grandmother   . Diabetes Maternal Grandfather   . Diabetes Paternal Grandfather   . Diabetes Other   . Esophageal cancer Neg Hx   . Liver cancer Neg Hx   . Pancreatic cancer Neg Hx   . Stomach cancer Neg Hx   . Rectal cancer Neg Hx     Social History Social History   Tobacco Use  . Smoking status: Never Smoker  . Smokeless tobacco: Never Used  Substance Use Topics  . Alcohol use: No    Alcohol/week: 0.0 standard drinks    Frequency: Never  . Drug use: No     Allergies   Other and Lactose intolerance (gi)   Review of Systems Review of Systems  Constitutional:  Positive for fatigue. Negative for chills and fever.  Respiratory: Negative for cough and shortness of breath.   Cardiovascular: Negative for chest pain and palpitations.  Gastrointestinal: Positive for abdominal distention, nausea and vomiting. Negative for blood in stool, constipation and diarrhea.  Skin: Negative for pallor and rash.  Neurological: Positive for light-headedness. Negative for seizures and syncope.  All other systems reviewed and are negative.    Physical Exam Updated Vital Signs BP (!) 154/103   Pulse (!) 111   Temp 98.8 F (37.1 C) (Oral)   Resp (!) 27   Ht 5\' 7"  (1.702 m)   Wt 49.9 kg   SpO2 99%   BMI 17.23 kg/m   Physical Exam Vitals signs and nursing note reviewed.  Constitutional:      General: She is in acute distress.     Comments: Thin female  HENT:     Head: Normocephalic and atraumatic.  Eyes:     Conjunctiva/sclera: Conjunctivae normal.  Neck:     Musculoskeletal: Neck supple.  Cardiovascular:     Rate and Rhythm: Regular rhythm. Tachycardia present.     Heart sounds: Normal heart sounds. No murmur.  Pulmonary:     Effort: Pulmonary effort is normal. No respiratory distress.     Breath sounds: Normal breath sounds.  Abdominal:     Palpations: Abdomen is soft.     Tenderness: There is abdominal tenderness in the epigastric area. There is no right CVA tenderness, left CVA tenderness, guarding or rebound. Negative signs include Murphy's sign and Rovsing's sign.  Skin:    General: Skin is warm and dry.  Neurological:     General: No focal deficit present.     Mental Status: She is alert and oriented to person, place, and time.      ED Treatments / Results  Labs (all labs ordered are listed, but only abnormal results are displayed) Labs Reviewed  COMPREHENSIVE METABOLIC PANEL - Abnormal; Notable for the following components:      Result Value   Sodium 132 (*)    Chloride 83 (*)    Glucose, Bld 679 (*)  BUN 22 (*)    Creatinine,  Ser 1.46 (*)    GFR calc non Af Amer 48 (*)    GFR calc Af Amer 56 (*)    Anion gap 18 (*)    All other components within normal limits  CBC - Abnormal; Notable for the following components:   RBC 3.78 (*)    Hemoglobin 10.9 (*)    HCT 31.5 (*)    All other components within normal limits  URINALYSIS, ROUTINE W REFLEX MICROSCOPIC - Abnormal; Notable for the following components:   Glucose, UA >=500 (*)    Hgb urine dipstick SMALL (*)    Ketones, ur 5 (*)    Protein, ur 30 (*)    All other components within normal limits  BASIC METABOLIC PANEL - Abnormal; Notable for the following components:   Chloride 97 (*)    Glucose, Bld 268 (*)    Creatinine, Ser 1.16 (*)    Calcium 8.6 (*)    All other components within normal limits  LIPASE, BLOOD  I-STAT BETA HCG BLOOD, ED ([facility], [facility], [facility] ONLY)    EKG EKG Interpretation  Date/Time:  Thursday December 23 2018 08:17:04 EDT Ventricular Rate:  103 PR Interval:    QRS Duration: 87 QT Interval:  343 QTC Calculation: 449 R Axis:   69 Text Interpretation:  Sinus tachycardia Baseline wander in lead(s) II No STEMI  Confirmed by Alvester Chourifan, Renley Gutman [phone]([id number]) on 12/23/2018 10:13:26 AM   Radiology Dg Chest Portable 1 View  Result Date: 12/23/2018 CLINICAL DATA:  Chest pain and weakness for 3 days. EXAM: PORTABLE CHEST 1 VIEW COMPARISON:  11/24/2018 FINDINGS: The heart size and mediastinal contours are within normal limits. Both lungs are clear. The visualized skeletal structures are unremarkable. IMPRESSION: No active disease. Electronically Signed   By: Danae OrleansJohn A Stahl M.D.   On: 12/23/2018 08:06    Procedures Procedures (including critical care time)  Medications Ordered in ED Medications  sodium chloride flush (NS) 0.9 % injection 3 mL (has no administration in time range)  sodium chloride 0.9 % bolus 2,000 mL (0 mLs Intravenous Stopped 12/23/18 1132)  haloperidol lactate (HALDOL) injection 2 mg (2 mg Intravenous Given 12/23/18 0853)   diphenhydrAMINE (BENADRYL) injection 25 mg (25 mg Intravenous Given 12/23/18 0852)  morphine 2 MG/ML injection 2 mg (2 mg Intravenous Given 12/23/18 0850)  potassium chloride 10 mEq in 100 mL IVPB (0 mEq Intravenous Stopped 12/23/18 1233)  insulin aspart (novoLOG) injection 10 Units (10 Units Intravenous Given 12/23/18 1027)  HYDROmorphone (DILAUDID) injection 1 mg (1 mg Intravenous Given 12/23/18 1431)  sodium chloride 0.9 % bolus 500 mL (500 mLs Intravenous New Bag/Given 12/23/18 1431)  0.9 %  sodium chloride infusion ( Intravenous New Bag/Given 12/23/18 1632)  ondansetron (ZOFRAN) injection 4 mg (4 mg Intravenous Given 12/23/18 1630)     Initial Impression / Assessment and Plan / ED Course  I have reviewed the triage vital signs and the nursing notes.  Pertinent labs & imaging results that were available during my care of the patient were reviewed by me and considered in my medical decision making (see chart for details).  28 year old female with a history of gastroparesis and insulin-dependent diabetes presenting to the emergency department with poor p.o. intake for several days.  Describes consistent abdominal pain with nausea and vomiting.  She reports this pain is very consistent with her gastroparesis.  On exam she does appear to be in distress and is crying and doubled over on the bed  holding her abdomen.  She has some focal epigastric tenderness.  She has no guarding or rebound or abdominal distention.  I suspect this is her gastroparesis which has exacerbated her diabetes and led to DKA.  She reports has not been taking insulin this week because she has not been eating.  I have a lower suspicion for acute infection given she has no localizing symptoms.  She is afebrile here.  I similarly have a low suspicion for acute infectious intra-abdominal process or inflammatory process including cholecystitis or appendicitis.  I do not believe she needs a stat CT scan at this time.  We will give IV  fluid boluses, IV Haldol and pain medications.  Will attempt to close her anion gap.  She may need a bolus of insulin.  Clinical Course as of Dec 22 1704  Thu Dec 23, 2018  0826 FINDINGS: The heart size and mediastinal contours are within normal limits. Both lungs are clear. The visualized skeletal structures are unremarkable.   [MT]  1434 On my reassessment the patient has improved pain but still complaining of significant epigastric pain.  She was able tolerate a few sips of Sprite at the bedside.  Her anion gap is closed with IV fluids and a dose of insulin.  I will give an extra 500 cc of fluids.  Also give another dose of pain medicine.  She prefers to try to manage this at home, and believes that she will be able to keep down fluids if her pain is under control.   [MT]  1435 I told her be willing to write her for a few days of pain medications.   [MT]  1536 Pt vomited again, now in significant pain.  Unable to tolerate further PO fluids.  Concern for worsening DKA if we discharge her home at this time.  Will need admission observation status   [MT]  1633 Signout given to Dr. Katrinka Blazing hospitalist   [MT]    Clinical Course User Index [MT] Terald Sleeper, MD     Final Clinical Impressions(s) / ED Diagnoses   Final diagnoses:  Gastroparesis  Diabetic ketoacidosis without coma associated with other specified diabetes mellitus (HCC)  Epigastric pain  Intractable vomiting with nausea, unspecified vomiting type    ED Discharge Orders    None       Terald Sleeper, MD 12/23/18 1706

## 2018-12-24 DIAGNOSIS — Z833 Family history of diabetes mellitus: Secondary | ICD-10-CM | POA: Diagnosis not present

## 2018-12-24 DIAGNOSIS — Z91018 Allergy to other foods: Secondary | ICD-10-CM | POA: Diagnosis not present

## 2018-12-24 DIAGNOSIS — E1010 Type 1 diabetes mellitus with ketoacidosis without coma: Secondary | ICD-10-CM | POA: Diagnosis present

## 2018-12-24 DIAGNOSIS — M797 Fibromyalgia: Secondary | ICD-10-CM | POA: Diagnosis present

## 2018-12-24 DIAGNOSIS — Z20828 Contact with and (suspected) exposure to other viral communicable diseases: Secondary | ICD-10-CM | POA: Diagnosis present

## 2018-12-24 DIAGNOSIS — Z794 Long term (current) use of insulin: Secondary | ICD-10-CM | POA: Diagnosis not present

## 2018-12-24 DIAGNOSIS — T383X6A Underdosing of insulin and oral hypoglycemic [antidiabetic] drugs, initial encounter: Secondary | ICD-10-CM | POA: Diagnosis present

## 2018-12-24 DIAGNOSIS — F329 Major depressive disorder, single episode, unspecified: Secondary | ICD-10-CM | POA: Diagnosis present

## 2018-12-24 DIAGNOSIS — N179 Acute kidney failure, unspecified: Secondary | ICD-10-CM | POA: Diagnosis present

## 2018-12-24 DIAGNOSIS — I10 Essential (primary) hypertension: Secondary | ICD-10-CM | POA: Diagnosis present

## 2018-12-24 DIAGNOSIS — F1290 Cannabis use, unspecified, uncomplicated: Secondary | ICD-10-CM | POA: Diagnosis present

## 2018-12-24 DIAGNOSIS — E1043 Type 1 diabetes mellitus with diabetic autonomic (poly)neuropathy: Secondary | ICD-10-CM | POA: Diagnosis present

## 2018-12-24 DIAGNOSIS — K3184 Gastroparesis: Secondary | ICD-10-CM | POA: Diagnosis present

## 2018-12-24 DIAGNOSIS — Z800 Family history of malignant neoplasm of digestive organs: Secondary | ICD-10-CM | POA: Diagnosis not present

## 2018-12-24 DIAGNOSIS — K219 Gastro-esophageal reflux disease without esophagitis: Secondary | ICD-10-CM | POA: Diagnosis present

## 2018-12-24 DIAGNOSIS — R109 Unspecified abdominal pain: Secondary | ICD-10-CM | POA: Diagnosis present

## 2018-12-24 DIAGNOSIS — F419 Anxiety disorder, unspecified: Secondary | ICD-10-CM | POA: Diagnosis present

## 2018-12-24 DIAGNOSIS — Z8249 Family history of ischemic heart disease and other diseases of the circulatory system: Secondary | ICD-10-CM | POA: Diagnosis not present

## 2018-12-24 DIAGNOSIS — E739 Lactose intolerance, unspecified: Secondary | ICD-10-CM | POA: Diagnosis present

## 2018-12-24 DIAGNOSIS — Z9049 Acquired absence of other specified parts of digestive tract: Secondary | ICD-10-CM | POA: Diagnosis not present

## 2018-12-24 DIAGNOSIS — D631 Anemia in chronic kidney disease: Secondary | ICD-10-CM | POA: Diagnosis present

## 2018-12-24 DIAGNOSIS — Z79891 Long term (current) use of opiate analgesic: Secondary | ICD-10-CM | POA: Diagnosis not present

## 2018-12-24 DIAGNOSIS — R112 Nausea with vomiting, unspecified: Secondary | ICD-10-CM

## 2018-12-24 DIAGNOSIS — Z79899 Other long term (current) drug therapy: Secondary | ICD-10-CM | POA: Diagnosis not present

## 2018-12-24 LAB — GLUCOSE, CAPILLARY
Glucose-Capillary: 151 mg/dL — ABNORMAL HIGH (ref 70–99)
Glucose-Capillary: 157 mg/dL — ABNORMAL HIGH (ref 70–99)
Glucose-Capillary: 160 mg/dL — ABNORMAL HIGH (ref 70–99)
Glucose-Capillary: 161 mg/dL — ABNORMAL HIGH (ref 70–99)

## 2018-12-24 LAB — CBC WITH DIFFERENTIAL/PLATELET
Abs Immature Granulocytes: 0.04 10*3/uL (ref 0.00–0.07)
Basophils Absolute: 0 10*3/uL (ref 0.0–0.1)
Basophils Relative: 0 %
Eosinophils Absolute: 0 10*3/uL (ref 0.0–0.5)
Eosinophils Relative: 0 %
HCT: 28.2 % — ABNORMAL LOW (ref 36.0–46.0)
Hemoglobin: 9.7 g/dL — ABNORMAL LOW (ref 12.0–15.0)
Immature Granulocytes: 0 %
Lymphocytes Relative: 26 %
Lymphs Abs: 2.4 10*3/uL (ref 0.7–4.0)
MCH: 29 pg (ref 26.0–34.0)
MCHC: 34.4 g/dL (ref 30.0–36.0)
MCV: 84.2 fL (ref 80.0–100.0)
Monocytes Absolute: 0.6 10*3/uL (ref 0.1–1.0)
Monocytes Relative: 6 %
Neutro Abs: 6.4 10*3/uL (ref 1.7–7.7)
Neutrophils Relative %: 68 %
Platelets: 254 10*3/uL (ref 150–400)
RBC: 3.35 MIL/uL — ABNORMAL LOW (ref 3.87–5.11)
RDW: 12.2 % (ref 11.5–15.5)
WBC: 9.4 10*3/uL (ref 4.0–10.5)
nRBC: 0 % (ref 0.0–0.2)

## 2018-12-24 LAB — BASIC METABOLIC PANEL
Anion gap: 12 (ref 5–15)
BUN: 17 mg/dL (ref 6–20)
CO2: 27 mmol/L (ref 22–32)
Calcium: 8.9 mg/dL (ref 8.9–10.3)
Chloride: 98 mmol/L (ref 98–111)
Creatinine, Ser: 0.84 mg/dL (ref 0.44–1.00)
GFR calc Af Amer: >60 mL/min (ref >60)
GFR calc non Af Amer: >60 mL/min (ref >60)
Glucose, Bld: 192 mg/dL — ABNORMAL HIGH (ref 70–99)
Potassium: 3.3 mmol/L — ABNORMAL LOW (ref 3.5–5.1)
Sodium: 137 mmol/L (ref 135–145)

## 2018-12-24 LAB — SARS CORONAVIRUS 2 (TAT 6-24 HRS): SARS Coronavirus 2: NEGATIVE

## 2018-12-24 LAB — RAPID URINE DRUG SCREEN, HOSP PERFORMED
Amphetamines: NOT DETECTED
Barbiturates: NOT DETECTED
Benzodiazepines: NOT DETECTED
Cocaine: NOT DETECTED
Opiates: POSITIVE — AB
Tetrahydrocannabinol: POSITIVE — AB

## 2018-12-24 MED ORDER — ADULT MULTIVITAMIN W/MINERALS CH
1.0000 | ORAL_TABLET | Freq: Every day | ORAL | Status: DC
  Administered 2018-12-24 – 2018-12-25 (×2): 1 via ORAL
  Filled 2018-12-24 (×2): qty 1

## 2018-12-24 MED ORDER — CAPSAICIN 0.025 % EX CREA
TOPICAL_CREAM | Freq: Two times a day (BID) | CUTANEOUS | Status: DC
  Administered 2018-12-24: 11:00:00 via TOPICAL
  Filled 2018-12-24: qty 60

## 2018-12-24 MED ORDER — ENSURE ENLIVE PO LIQD: 237.0000 mL | Freq: Two times a day (BID) | ORAL | Status: DC

## 2018-12-24 MED ORDER — POTASSIUM CHLORIDE IN NACL 20-0.9 MEQ/L-% IV SOLN
INTRAVENOUS | Status: DC
  Administered 2018-12-24: 13:00:00 via INTRAVENOUS
  Filled 2018-12-24 (×3): qty 1000

## 2018-12-24 MED ORDER — MORPHINE SULFATE (PF) 2 MG/ML IV SOLN
0.5000 mg | Freq: Once | INTRAVENOUS | Status: AC
  Administered 2018-12-24: 0.5 mg via INTRAVENOUS
  Filled 2018-12-24: qty 1

## 2018-12-24 MED ORDER — CALCIUM CARBONATE ANTACID 500 MG PO CHEW: 1.0000 | CHEWABLE_TABLET | Freq: Three times a day (TID) | ORAL | Status: DC | PRN

## 2018-12-24 MED ORDER — HALOPERIDOL LACTATE 5 MG/ML IJ SOLN
2.0000 mg | Freq: Once | INTRAMUSCULAR | Status: AC
  Administered 2018-12-24: 2 mg via INTRAVENOUS
  Filled 2018-12-24: qty 1

## 2018-12-24 NOTE — Progress Notes (Signed)
Initial Nutrition Assessment  DOCUMENTATION CODES:   Underweight  INTERVENTION:   -MVI with minerals daily -D/c Boost Breeze po TID, each supplement provides 250 kcal and 9 grams of protein -Ensure Enlive po BID, each supplement provides 350 kcal and 20 grams of protein  NUTRITION DIAGNOSIS:   Inadequate oral intake related to nausea, vomiting as evidenced by per patient/family report.  GOAL:   Patient will meet greater than or equal to 90% of their needs  MONITOR:   Supplement acceptance, PO intake, Diet advancement, Labs, Weight trends, Skin, I & O's  REASON FOR ASSESSMENT:   Malnutrition Screening Tool    ASSESSMENT:   [name] is a 28 y.o. female with medical history significant of insulin-dependent diabetes mellitus with gastroparesis, hypertension, anxiety, depression, fibromyalgia, GERD; who presents with complaints of 1 week of epigastric abdominal pain with nausea and vomiting.  Pain is sharp and constant.  Emesis has been brown in color, but she denies any reports of blood.  She has been unable to tolerate any food or liquids.  She has not been taking her insulin like usual due to inability to eat.  Associated symptoms include sharp lower back pain, some chest discomfort, and diarrhea.  Denies any recent fall/trauma, significant fever, cough, shortness of breath.  Tried home medications of tramadol without relief of pain symptoms.  Patient has had similar admissions like this in the past and reports usually requiring IV pain medication and Phenergan to relieve symptoms.  Last admitted from 7/21 through 7/25 patient had CT scan which showed mild thickening of the colon as noted on previous scans.  She was not treated with antibiotics and improved with symptomatic treatments.  Pt admitted with nausea and vomiting, likely related to diabetic gastroparesis.   Reviewed I/O's: +3.6 L x 24 hours  Per MD notes, nausea and vomiting likely exacerbated by excessive marijuana  use. Pt has requested to advance diet to full liquids today. Pt refusing Boost Breeze supplements.  No meal completion records available to assess intake.   Reviewed wt hx; noted pt has experienced a 14.8% wt loss over the past 3 months, which is significant for time frame. Suspect some wt loss may be related to dehydration. Given underweight status, pt is at high risk ofr malnutrition.   Pt receiving nursing care at time of visit and unable to speak with pt at this time.   Medications reviewed and include reglan and 0.9% NaCl with KCl 20 mEq/ L infusion @ 75 ml/hr.   Pt with poor oral intake and would benefit from nutrient dense supplement. One Ensure Enlive supplement provides 350 kcals, 20 grams protein, and 44-45 grams of carbohydrate vs one Glucerna shake supplement, which provides 220 kcals, 10 grams of protein, and 26 grams of carbohydrate. Given pt's hx of DM, RD will continue to monitor PO intake, CBGS, and adjust supplement regimen as appropriate.   Lab Results  Component Value Date   HGBA1C 9.6 (H) 09/20/2018   PTA DM medications are 12 units insulin glargine daily and 0-10 units insulin lispro daily .   Labs reviewed: K: 3.3, CBGS: 157-161 (inpatient orders for glycemic control are 0-9 units insulin aspart every 6 hourd and 12 units insulin glargine daily).   Diet Order:   Diet Order            Diet full liquid Room service appropriate? Yes; Fluid consistency: Thin  Diet effective now  EDUCATION NEEDS:   No education needs have been identified at this time  Skin:  Skin Assessment: Reviewed RN Assessment  Last BM:  12/23/18  Height:   Ht Readings from Last 1 Encounters:  12/23/18 5\' 7"  (1.702 m)    Weight:   Wt Readings from Last 1 Encounters:  12/23/18 49.9 kg    Ideal Body Weight:  61.4 kg  BMI:  Body mass index is 17.23 kg/m.  Estimated Nutritional Needs:   Kcal:  1550-1750  Protein:  85-100 grams  Fluid:  > 1.5 L    [name] A.  Jimmye Norman, RD, LDN, [place] Registered Dietitian II Certified Diabetes Care and Education Specialist Pager: [phone] After hours Pager: [phone]

## 2018-12-24 NOTE — Plan of Care (Signed)
  Problem: Education: Goal: Knowledge of General Education information will improve Description: Including pain rating scale, medication(s)/side effects and non-pharmacologic comfort measures Outcome: Progressing   Problem: Pain Managment: Goal: General experience of comfort will improve Outcome: Not Progressing Note:  Patient continues to complain about pain to abd and back. Refuses to use capsicin stating it causes a rash. Patient given kpad. Scheduled bentyl. Refusing tylenol states it causes reflux. Notified team.

## 2018-12-24 NOTE — Progress Notes (Signed)
Progress Note    [name]  ZOX:[id number] DOB: 10-22-1990  DOA: 12/23/2018 PCP: [name], MD    Brief Narrative:     Medical records reviewed and are as summarized below:  [name] is an 28 y.o. female with medical history significant of insulin-dependent diabetes mellitus with gastroparesis, hypertension, anxiety, depression, fibromyalgia, GERD; who presents with complaints of 1 week of epigastric abdominal pain with nausea and vomiting.  Pain is sharp and constant.  Emesis has been brown in color, but she denies any reports of blood.  She has been unable to tolerate any food or liquids.  She has not been taking her insulin like usual due to inability to eat.   Assessment/Plan:   Principal Problem:   Intractable nausea and vomiting Active Problems:   Diabetes mellitus type 1 (HCC)   GERD (gastroesophageal reflux disease)   Acute kidney injury ([place])   Anxiety   DKA, type 1 (HCC)   Diabetic gastroparesis (HCC)   Nausea and vomiting suspect secondary to diabetic gastroparesis: Acute on chronic.  Patient presents with 5 days of nausea, vomiting, and epigastric abdominal pain. Suspect secondary to history of gastroparesis similar presentation previously in the past. -Antiemetics as needed -Reglan 10 mg IV 3 times daily -Normal saline at 100 ml/hr until taking reliable PO -encouraged marijuana cessation-- she admits this worsens her nausea when she uses heavily: HOT showers and capsacin cream for abdominal pain -avoid narcotics  DKA type I: Resolved.  Patient initially presented in DKA, but had reportedly not been taking insulin as advised with nausea and vomiting symptoms.  Initial glucose 600 with anion gap 18.  Last hemoglobin A1c 9.6 on 09/20/2018. -Hypoglycemic protocol -Continue home Lantus 12 units nightly -CBGs every 6 hours with sensitive SSI -Adjust insulin regimen as needed  Essential hypertension: Blood pressures elevated up to 170/18 on  admission. -Continue home medications of hydralazine and metoprolol -Labetalol IV as needed  Acute kidney injury: Resolving.  On admission patient's creatinine initially elevated up to 1.46 with BUN 22.  Baseline creatinine previously noted to be within normal limits.  Given reports of nausea, vomiting, and diarrhea suspect prerenal in nature. -Continue IV fluids -trend with AM BMP  Normocytic normochromic anemia: Chronic.  Hemoglobin 10.9 g/dL on admission.  Baseline hemoglobin appears to range from 10-11. -Continue to monitor  Lumbar back pain, history of fibromyalgia: Patient denies any reports of any recent trauma or fall.  Normally on tramadol pain at home. -avoid narcotics-- can use heat, voltaren gel or lidocaine patched if needed  Anxiety -Changed Ativan to IV PRN until taking in POs more consistantly  GERD -Changed Protonix to IV BID until taking POs more consistantly   Family Communication/Anticipated D/C date and plan/Code Status   DVT prophylaxis: Lovenox ordered. Code Status: Full Code.  Family Communication:  Disposition Plan: home in 24-48 hours   Medical Consultants:    None.    Subjective:   Would like to try full liquids Says her symptoms get worse after smoking marijuana heavily   Objective:    Vitals:   12/23/18 2206 12/24/18 0624 12/24/18 0649 12/24/18 0845  BP: (!) 152/95 (!) 161/103 (!) 160/100 (!) 180/110  Pulse: (!) 108 100  (!) 102  Resp: 18     Temp: 98 F (36.7 C) 99 F (37.2 C)  98.1 F (36.7 C)  TempSrc:      SpO2: 100% 98%  100%  Weight:      Height:  Intake/Output Summary (Last 24 hours) at 12/24/2018 1101 Last data filed at 12/24/2018 0600 Gross per 24 hour  Intake 3571.16 ml  Output -  Net 3571.16 ml   Filed Weights   12/23/18 0457  Weight: 49.9 kg    Exam: In bed, NAD rrr No increased work of breathing Cooperative +BS, soft  Data Reviewed:   I have personally reviewed following labs and imaging  studies:  Labs: Labs show the following:   Basic Metabolic Panel: Recent Labs  Lab 12/22/18 1759 12/23/18 0516 12/23/18 1134 12/24/18 0224  NA 133* 132* 137 137  K 3.2* 3.6 4.3 3.3*  CL 88* 83* 97* 98  CO2 26 31 28 27   GLUCOSE 612* 679* 268* 192*  BUN 21* 22* 20 17  CREATININE 1.46* 1.46* 1.16* 0.84  CALCIUM 9.4 9.5 8.6* 8.9   GFR Estimated Creatinine Clearance: 77.8 mL/min (by C-G formula based on SCr of 0.84 mg/dL). Liver Function Tests: Recent Labs  Lab 12/22/18 1759 12/23/18 0516  AST 13* 16  ALT 13 14  ALKPHOS 91 96  BILITOT 0.5 0.7  PROT 7.0 7.5  ALBUMIN 3.5 3.7   Recent Labs  Lab 12/22/18 1759 12/23/18 0516  LIPASE 21 24   No results for input(s): AMMONIA in the last 168 hours. Coagulation profile No results for input(s): INR, PROTIME in the last 168 hours.  CBC: Recent Labs  Lab 12/22/18 1759 12/23/18 0516 12/24/18 0224  WBC 3.9* 4.2 9.4  NEUTROABS  --   --  6.4  HGB 10.3* 10.9* 9.7*  HCT 31.3* 31.5* 28.2*  MCV 85.1 83.3 84.2  PLT 271 281 254   Cardiac Enzymes: No results for input(s): CKTOTAL, CKMB, CKMBINDEX, TROPONINI in the last 168 hours. BNP (last 3 results) No results for input(s): PROBNP in the last 8760 hours. CBG: Recent Labs  Lab 12/23/18 1935 12/24/18 0017 12/24/18 0623  GLUCAP 314* 157* 161*   D-Dimer: No results for input(s): DDIMER in the last 72 hours. Hgb A1c: No results for input(s): HGBA1C in the last 72 hours. Lipid Profile: No results for input(s): CHOL, HDL, LDLCALC, TRIG, CHOLHDL, LDLDIRECT in the last 72 hours. Thyroid function studies: No results for input(s): TSH, T4TOTAL, T3FREE, THYROIDAB in the last 72 hours.  Invalid input(s): FREET3 Anemia work up: No results for input(s): VITAMINB12, FOLATE, FERRITIN, TIBC, IRON, RETICCTPCT in the last 72 hours. Sepsis Labs: Recent Labs  Lab 12/22/18 1759 12/23/18 0516 12/24/18 0224  WBC 3.9* 4.2 9.4    Microbiology Recent Results (from the past 240  hour(s))  SARS CORONAVIRUS 2 (TAT 6-24 HRS) Nasopharyngeal Nasopharyngeal Swab     Status: None   Collection Time: 12/23/18  7:06 PM   Specimen: Nasopharyngeal Swab  Result Value Ref Range Status   SARS Coronavirus 2 NEGATIVE NEGATIVE Final    Comment: (NOTE) SARS-CoV-2 target nucleic acids are NOT DETECTED. The SARS-CoV-2 RNA is generally detectable in upper and lower respiratory specimens during the acute phase of infection. Negative results do not preclude SARS-CoV-2 infection, do not rule out co-infections with other pathogens, and should not be used as the sole basis for treatment or other patient management decisions. Negative results must be combined with clinical observations, patient history, and epidemiological information. The expected result is Negative. Fact Sheet for Patients: SugarRoll.be Fact Sheet for Healthcare Providers: https://www.woods-mathews.com/ This test is not yet approved or cleared by the [place] FDA and  has been authorized for detection and/or diagnosis of SARS-CoV-2 by FDA under an Emergency Use Authorization (  EUA). This EUA will remain  in effect (meaning this test can be used) for the duration of the COVID-19 declaration under Section 56 4(b)(1) of the Act, 21 U.S.C. section 360bbb-3(b)(1), unless the authorization is terminated or revoked sooner. Performed at [facility], [place] [address] [place], [place] [address]     Procedures and diagnostic studies:  Dg Chest Portable 1 View  Result Date: 12/23/2018 CLINICAL DATA:  Chest pain and weakness for 3 days. EXAM: PORTABLE CHEST 1 VIEW COMPARISON:  11/24/2018 FINDINGS: The heart size and mediastinal contours are within normal limits. Both lungs are clear. The visualized skeletal structures are unremarkable. IMPRESSION: No active disease. Electronically Signed   By: Marlaine Hind M.D.   On: 12/23/2018 08:06   Dg Abd 2 Views  Result Date:  12/23/2018 CLINICAL DATA:  Abdominal pain, vomiting x1 week. Hx of hypertension, diabetes, gastroparesis, GERD, sigmoidoscopy(2018), laparoscopy(2013), esophagogastroduodenoscopy(2013), colonoscopy, cholecystectomy(2013). Nonsmoker. EXAM: ABDOMEN - 2 VIEW COMPARISON:  10/22/2018 FINDINGS: There is no bowel dilation to suggest obstruction. No air-fluid levels and no free air. Relative paucity of bowel gas. No evidence of renal or ureteral stones. Soft tissues are unremarkable other than vascular clips from a prior cholecystectomy. Skeletal structures are within normal limits. IMPRESSION: 1. No acute findings. No evidence of bowel obstruction, generalized adynamic ileus or free air. Electronically Signed   By: Lajean Manes M.D.   On: 12/23/2018 19:37    Medications:   . capsaicin   Topical BID  . dicyclomine  20 mg Oral TID AC & HS  . enoxaparin (LOVENOX) injection  40 mg Subcutaneous QHS  . feeding supplement  1 Container Oral TID BM  . hydrALAZINE  50 mg Oral Q6H  . insulin aspart  0-9 Units Subcutaneous Q6H  . insulin glargine  12 Units Subcutaneous QHS  . metoCLOPramide (REGLAN) injection  10 mg Intravenous Q8H  . metoprolol tartrate  25 mg Oral BID  . mirtazapine  30 mg Oral QHS  . pantoprazole (PROTONIX) IV  40 mg Intravenous Q12H  . sodium chloride flush  3 mL Intravenous Once   Continuous Infusions: . sodium chloride 100 mL/hr at 12/24/18 0842     LOS: 0 days   Geradine Girt  [facility]   How to contact the [facility] Attending or Consulting provider [place] or covering provider during after hours [place], for this patient?  1. Check the care team in [facility] and look for a) attending/consulting [facility] provider listed and b) the [facility] team listed 2. Log into [email or web address] and use [place]'s universal password to access. If you do not have the password, please contact the hospital operator. 3. Locate the [facility] provider you are looking for under [facility] and page to a number  that you can be directly reached. 4. If you still have difficulty reaching the provider, please page the [facility] (Director on Call) for the Hospitalists listed on amion for assistance.  12/24/2018, 11:01 AM

## 2018-12-25 LAB — GLUCOSE, CAPILLARY
Glucose-Capillary: 163 mg/dL — ABNORMAL HIGH (ref 70–99)
Glucose-Capillary: 238 mg/dL — ABNORMAL HIGH (ref 70–99)
Glucose-Capillary: 80 mg/dL (ref 70–99)

## 2018-12-25 LAB — CBC
HCT: 27.1 % — ABNORMAL LOW (ref 36.0–46.0)
Hemoglobin: 9.4 g/dL — ABNORMAL LOW (ref 12.0–15.0)
MCH: 29.6 pg (ref 26.0–34.0)
MCHC: 34.7 g/dL (ref 30.0–36.0)
MCV: 85.2 fL (ref 80.0–100.0)
Platelets: 206 10*3/uL (ref 150–400)
RBC: 3.18 MIL/uL — ABNORMAL LOW (ref 3.87–5.11)
RDW: 12.4 % (ref 11.5–15.5)
WBC: 7.3 10*3/uL (ref 4.0–10.5)
nRBC: 0 % (ref 0.0–0.2)

## 2018-12-25 LAB — BASIC METABOLIC PANEL
Anion gap: 10 (ref 5–15)
BUN: 12 mg/dL (ref 6–20)
CO2: 24 mmol/L (ref 22–32)
Calcium: 8.6 mg/dL — ABNORMAL LOW (ref 8.9–10.3)
Chloride: 103 mmol/L (ref 98–111)
Creatinine, Ser: 0.78 mg/dL (ref 0.44–1.00)
GFR calc Af Amer: >60 mL/min (ref >60)
GFR calc non Af Amer: >60 mL/min (ref >60)
Glucose, Bld: 155 mg/dL — ABNORMAL HIGH (ref 70–99)
Potassium: 4.2 mmol/L (ref 3.5–5.1)
Sodium: 137 mmol/L (ref 135–145)

## 2018-12-25 NOTE — Progress Notes (Signed)
Patient given AVS instructions and discharge education. PIV removed Discharged home with spouse via wheelchair.

## 2018-12-25 NOTE — Discharge Summary (Signed)
Physician Discharge Summary  [name] IOE:[id number] DOB: 07/03/1990 DOA: 12/23/2018  PCP: [name], MD  Admit date: 12/23/2018 Discharge date: 12/25/2018  Admitted From: Home Disposition:  Home  Discharge Condition:Stable CODE STATUS:FULL Diet recommendation: Carb Modified  Brief/Interim Summary:  [name] is an 28 y.o. female with medical history significant ofinsulin-dependent diabetes mellitus with gastroparesis, hypertension, anxiety, depression, fibromyalgia, GERD; who presents with complaints of 1 week of epigastric abdominal pain with nausea and vomiting. Pain is sharp and constant. Emesis has been brown in color, but she denies any reports of blood. She has been unable to tolerate any food or liquids. She has not been taking her insulin like usual due to inability to eat.  Her nausea and vomiting has resolved.  Most likely associated with diabetic gastroparesis, marijuana use.  She remains hemodynamically stable today.  She is stable for discharge, we recommend to continue her insulin at home.  Counseled for cessation of marijuana.  Following problems were addressed during her hospitalization:  Nausea and vomiting suspect secondary to diabetic gastroparesis: Acute on chronic.  Patient presents with 5 days of nausea, vomiting, and epigastric abdominal pain. Suspect secondary to history of gastroparesis similar presentation previously in the past.Resolved.  DKA type I: Resolved.  Patient initially presented in DKA, but had reportedly not been taking insulin as advised with nausea and vomiting symptoms.  Initial glucose 600 with anion gap 18.  Last hemoglobin A1c 9.6 on 09/20/2018. Continue home regimen.  Diarrhea: Resolved  Essential hypertension: Blood pressures elevated up to 170/18 on admission.Now stable Continue home medications of hydralazine and metoprolol   Acute kidney injury: Resolved  Normocytic normochromic anemia: Chronic.  Hemoglobin 10.9  g/dL on admission.  Baseline hemoglobin appears to range from 10-11.  Lumbar back pain, history of fibromyalgia: Patient denies any reports of any recent trauma or fall.  Normally on tramadol pain at home.  GERD Continue PPI    Discharge Diagnoses:  Principal Problem:   Intractable nausea and vomiting Active Problems:   Diabetes mellitus type 1 (HCC)   GERD (gastroesophageal reflux disease)   Acute kidney injury ([place])   Anxiety   DKA, type 1 (HCC)   Diabetic gastroparesis ([place])   Gastroparesis    Discharge Instructions  Discharge Instructions    Diet Carb Modified   Complete by: As directed    Discharge instructions   Complete by: As directed    1)Please stop taking marijuana. 2)Monitor your blood glucose at home.Continue your insulin. 3)Follow up with your PCP in a week.   Increase activity slowly   Complete by: As directed      Allergies as of 12/25/2018      Reactions   Other Anaphylaxis   Reaction to [place] nuts   Lactose Intolerance (gi) Diarrhea      Medication List    TAKE these medications   Dexcom G6 Receiver Devi USE AS DIRECTED TO MONITOR GLUCOSE LEVELS   Dexcom G6 Transmitter Misc USE AS DIRECTED TO MONITOR GLUCOSE LEVELS   diclofenac 75 MG EC tablet Commonly known as: VOLTAREN Take 1 tablet (75 mg total) by mouth daily.   dicyclomine 20 MG tablet Commonly known as: BENTYL Take 1 tablet (20 mg total) by mouth 4 (four) times daily -  before meals and at bedtime.   furosemide 40 MG tablet Commonly known as: LASIX TAKE 1 TABLET BY MOUTH  DAILY   hydrALAZINE 50 MG tablet Commonly known as: APRESOLINE Take 1 tablet (50 mg total) by  mouth every 6 (six) hours.   insulin lispro 100 UNIT/ML injection Commonly known as: HUMALOG Inject 0-0.1 mLs (0-10 Units total) into the skin See admin instructions. Inject 0-10 units subcutaneously three times daily with meals per sliding scale What changed: additional instructions   Lantus 100 UNIT/ML  injection Generic drug: insulin glargine Inject 0.08 mLs (8 Units total) into the skin at bedtime. Patient using Vials What changed: how much to take   LORazepam 0.5 MG tablet Commonly known as: ATIVAN Take 1 tablet (0.5 mg total) by mouth 2 (two) times daily as needed for anxiety.   metoCLOPramide 10 MG tablet Commonly known as: REGLAN Take 1 tablet (10 mg total) by mouth every 6 (six) hours as needed for nausea or vomiting.   metoprolol tartrate 25 MG tablet Commonly known as: LOPRESSOR Take 1 tablet (25 mg total) by mouth 2 (two) times daily.   mirtazapine 30 MG tablet Commonly known as: Remeron Take 1 tablet (30 mg total) by mouth at bedtime.   ondansetron 4 MG tablet Commonly known as: Zofran Take 1 tablet (4 mg total) by mouth every 8 (eight) hours as needed for nausea or vomiting (If necessary, dont combine with Reglan.).   pantoprazole 40 MG tablet Commonly known as: PROTONIX TAKE 1 TABLET BY MOUTH  DAILY   Potassium Chloride ER 20 MEQ Tbcr Take 20 mEq by mouth 2 (two) times daily.   promethazine 12.5 MG tablet Commonly known as: PHENERGAN Take 1 tablet (12.5 mg total) by mouth every 8 (eight) hours as needed for nausea.   promethazine 25 MG suppository Commonly known as: PHENERGAN Place 1 suppository (25 mg total) rectally every 8 (eight) hours as needed for nausea or vomiting.   traMADol 50 MG tablet Commonly known as: ULTRAM Take 1 tablet (50 mg total) by mouth every 6 (six) hours as needed for moderate pain or severe pain.      Follow-up Information    [name], MD. Schedule an appointment as soon as possible for a visit in 1 week(s).   Specialty: Family Medicine Contact information: [place] [id number] [phone]          Allergies  Allergen Reactions  . Other Anaphylaxis    Reaction to [place] nuts   . Lactose Intolerance (Gi) Diarrhea    Consultations: None   Procedures/Studies: Dg Chest Portable 1  View  Result Date: 12/23/2018 CLINICAL DATA:  Chest pain and weakness for 3 days. EXAM: PORTABLE CHEST 1 VIEW COMPARISON:  11/24/2018 FINDINGS: The heart size and mediastinal contours are within normal limits. Both lungs are clear. The visualized skeletal structures are unremarkable. IMPRESSION: No active disease. Electronically Signed   By: Marlaine Hind M.D.   On: 12/23/2018 08:06   Dg Abd 2 Views  Result Date: 12/23/2018 CLINICAL DATA:  Abdominal pain, vomiting x1 week. Hx of hypertension, diabetes, gastroparesis, GERD, sigmoidoscopy(2018), laparoscopy(2013), esophagogastroduodenoscopy(2013), colonoscopy, cholecystectomy(2013). Nonsmoker. EXAM: ABDOMEN - 2 VIEW COMPARISON:  10/22/2018 FINDINGS: There is no bowel dilation to suggest obstruction. No air-fluid levels and no free air. Relative paucity of bowel gas. No evidence of renal or ureteral stones. Soft tissues are unremarkable other than vascular clips from a prior cholecystectomy. Skeletal structures are within normal limits. IMPRESSION: 1. No acute findings. No evidence of bowel obstruction, generalized adynamic ileus or free air. Electronically Signed   By: Lajean Manes M.D.   On: 12/23/2018 19:37       Subjective: Patient seen and examined the bedside this morning.  Hemodynamically stable for discharge.  Discharge Exam: Vitals:   12/25/18 0911 12/25/18 0914  BP: 109/77 109/77  Pulse: 87 70  Resp:  16  Temp:  98.9 F (37.2 C)  SpO2:  100%   Vitals:   12/24/18 2115 12/25/18 0556 12/25/18 0911 12/25/18 0914  BP: 124/89 105/72 109/77 109/77  Pulse: 91 92 87 70  Resp: 18 18  16   Temp: 98.6 F (37 C) 99.8 F (37.7 C)  98.9 F (37.2 C)  TempSrc:      SpO2: 100% 100%  100%  Weight:      Height:        General: Pt is alert, awake, not in acute distress Cardiovascular: RRR, S1/S2 +, no rubs, no gallops Respiratory: CTA bilaterally, no wheezing, no rhonchi Abdominal: Soft, NT, ND, bowel sounds + Extremities: no edema, no  cyanosis    The results of significant diagnostics from this hospitalization (including imaging, microbiology, ancillary and laboratory) are listed below for reference.     Microbiology: Recent Results (from the past 240 hour(s))  SARS CORONAVIRUS 2 (TAT 6-24 HRS) Nasopharyngeal Nasopharyngeal Swab     Status: None   Collection Time: 12/23/18  7:06 PM   Specimen: Nasopharyngeal Swab  Result Value Ref Range Status   SARS Coronavirus 2 NEGATIVE NEGATIVE Final    Comment: (NOTE) SARS-CoV-2 target nucleic acids are NOT DETECTED. The SARS-CoV-2 RNA is generally detectable in upper and lower respiratory specimens during the acute phase of infection. Negative results do not preclude SARS-CoV-2 infection, do not rule out co-infections with other pathogens, and should not be used as the sole basis for treatment or other patient management decisions. Negative results must be combined with clinical observations, patient history, and epidemiological information. The expected result is Negative. Fact Sheet for Patients: SugarRoll.be Fact Sheet for Healthcare Providers: https://www.woods-mathews.com/ This test is not yet approved or cleared by the [place] FDA and  has been authorized for detection and/or diagnosis of SARS-CoV-2 by FDA under an Emergency Use Authorization (EUA). This EUA will remain  in effect (meaning this test can be used) for the duration of the COVID-19 declaration under Section 56 4(b)(1) of the Act, 21 U.S.C. section 360bbb-3(b)(1), unless the authorization is terminated or revoked sooner. Performed at [facility], [place] [address] [place], [place] [address]      Labs: BNP (last 3 results) No results for input(s): BNP in the last 8760 hours. Basic Metabolic Panel: Recent Labs  Lab 12/22/18 1759 12/23/18 0516 12/23/18 1134 12/24/18 0224 12/25/18 0255  NA 133* 132* 137 137 137  K 3.2* 3.6 4.3 3.3* 4.2  CL  88* 83* 97* 98 103  CO2 26 31 28 27 24   GLUCOSE 612* 679* 268* 192* 155*  BUN 21* 22* 20 17 12   CREATININE 1.46* 1.46* 1.16* 0.84 0.78  CALCIUM 9.4 9.5 8.6* 8.9 8.6*   Liver Function Tests: Recent Labs  Lab 12/22/18 1759 12/23/18 0516  AST 13* 16  ALT 13 14  ALKPHOS 91 96  BILITOT 0.5 0.7  PROT 7.0 7.5  ALBUMIN 3.5 3.7   Recent Labs  Lab 12/22/18 1759 12/23/18 0516  LIPASE 21 24   No results for input(s): AMMONIA in the last 168 hours. CBC: Recent Labs  Lab 12/22/18 1759 12/23/18 0516 12/24/18 0224 12/25/18 0255  WBC 3.9* 4.2 9.4 7.3  NEUTROABS  --   --  6.4  --   HGB 10.3* 10.9* 9.7* 9.4*  HCT 31.3* 31.5* 28.2* 27.1*  MCV  85.1 83.3 84.2 85.2  PLT 271 281 254 206   Cardiac Enzymes: No results for input(s): CKTOTAL, CKMB, CKMBINDEX, TROPONINI in the last 168 hours. BNP: Invalid input(s): POCBNP CBG: Recent Labs  Lab 12/24/18 0623 12/24/18 1139 12/24/18 1815 12/25/18 0029 12/25/18 0750  GLUCAP 161* 160* 151* 238* 163*   D-Dimer No results for input(s): DDIMER in the last 72 hours. Hgb A1c No results for input(s): HGBA1C in the last 72 hours. Lipid Profile No results for input(s): CHOL, HDL, LDLCALC, TRIG, CHOLHDL, LDLDIRECT in the last 72 hours. Thyroid function studies No results for input(s): TSH, T4TOTAL, T3FREE, THYROIDAB in the last 72 hours.  Invalid input(s): FREET3 Anemia work up No results for input(s): VITAMINB12, FOLATE, FERRITIN, TIBC, IRON, RETICCTPCT in the last 72 hours. Urinalysis    Component Value Date/Time   COLORURINE YELLOW 12/23/2018 1309   APPEARANCEUR CLEAR 12/23/2018 1309   LABSPEC 1.021 12/23/2018 1309   PHURINE 6.0 12/23/2018 1309   GLUCOSEU >=500 (A) 12/23/2018 1309   HGBUR SMALL (A) 12/23/2018 1309   BILIRUBINUR NEGATIVE 12/23/2018 1309   BILIRUBINUR negative 01/02/2015 1717   KETONESUR 5 (A) 12/23/2018 1309   PROTEINUR 30 (A) 12/23/2018 1309   UROBILINOGEN 0.2 01/13/2015 0223   NITRITE NEGATIVE 12/23/2018 1309    LEUKOCYTESUR NEGATIVE 12/23/2018 1309   Sepsis Labs Invalid input(s): PROCALCITONIN,  WBC,  LACTICIDVEN Microbiology Recent Results (from the past 240 hour(s))  SARS CORONAVIRUS 2 (TAT 6-24 HRS) Nasopharyngeal Nasopharyngeal Swab     Status: None   Collection Time: 12/23/18  7:06 PM   Specimen: Nasopharyngeal Swab  Result Value Ref Range Status   SARS Coronavirus 2 NEGATIVE NEGATIVE Final    Comment: (NOTE) SARS-CoV-2 target nucleic acids are NOT DETECTED. The SARS-CoV-2 RNA is generally detectable in upper and lower respiratory specimens during the acute phase of infection. Negative results do not preclude SARS-CoV-2 infection, do not rule out co-infections with other pathogens, and should not be used as the sole basis for treatment or other patient management decisions. Negative results must be combined with clinical observations, patient history, and epidemiological information. The expected result is Negative. Fact Sheet for Patients: SugarRoll.be Fact Sheet for Healthcare Providers: https://www.woods-mathews.com/ This test is not yet approved or cleared by the [place] FDA and  has been authorized for detection and/or diagnosis of SARS-CoV-2 by FDA under an Emergency Use Authorization (EUA). This EUA will remain  in effect (meaning this test can be used) for the duration of the COVID-19 declaration under Section 56 4(b)(1) of the Act, 21 U.S.C. section 360bbb-3(b)(1), unless the authorization is terminated or revoked sooner. Performed at [facility], [place] [address], [place], [place] [address]     Please note: You were cared for by a hospitalist during your hospital stay. Once you are discharged, your primary care physician will handle any further medical issues. Please note that NO REFILLS for any discharge medications will be authorized once you are discharged, as it is imperative that you return to your primary care  physician (or establish a relationship with a primary care physician if you do not have one) for your post hospital discharge needs so that they can reassess your need for medications and monitor your lab values.    Time coordinating discharge: 40 minutes  SIGNED:   Shelly Coss, MD  [facility] 12/25/2018, 10:14 AM Pager [phone]  If 7PM-7AM, please contact night-coverage [email or web address] Password [email or web address]

## 2018-12-25 NOTE — Plan of Care (Signed)

## 2018-12-27 ENCOUNTER — Telehealth: Payer: Self-pay

## 2018-12-27 NOTE — Telephone Encounter (Signed)
Transition Care Management Follow-up Telephone Call  Date of discharge and from where: 12/25/18   How have you been since you were released from the hospital? Starting to feel better  Any questions or concerns? No   Items Reviewed:  Did the pt receive and understand the discharge instructions provided? Yes   Medications obtained and verified? Yes   Any new allergies since your discharge? No   Dietary orders reviewed? Yes  Do you have support at home? Yes   Other (ie: DME, Home Health, etc) none   Functional Questionnaire: (I = Independent and D = Dependent) ADL's: I  Bathing/Dressing- I   Meal Prep- I  Eating- I  Maintaining continence- I  Transferring/Ambulation- I  Managing Meds- I    Follow up appointments reviewed:    PCP Hospital f/u appt confirmed? Yes  Scheduled to see Dr. Jerline Pain on 01/04/19 @ 11 (virtual visit).  [facility] f/u appt confirmed? Yes  Scheduled to see Dr. [name] on 12/31/18 @ 3:15.  Are transportation arrangements needed? No   If their condition worsens, is the pt aware to call  their PCP or go to the ED? Yes  Was the patient provided with contact information for the PCP's office or ED? Yes  Was the pt encouraged to call back with questions or concerns? Yes

## 2018-12-31 ENCOUNTER — Other Ambulatory Visit: Payer: Self-pay

## 2018-12-31 ENCOUNTER — Encounter: Payer: Self-pay | Admitting: Endocrinology

## 2018-12-31 ENCOUNTER — Ambulatory Visit (INDEPENDENT_AMBULATORY_CARE_PROVIDER_SITE_OTHER): Payer: 59 | Admitting: Endocrinology

## 2018-12-31 VITALS — BP 122/80 | HR 105 | Ht 67.0 in | Wt 146.6 lb

## 2018-12-31 DIAGNOSIS — Z9114 Patient's other noncompliance with medication regimen: Secondary | ICD-10-CM | POA: Diagnosis not present

## 2018-12-31 DIAGNOSIS — E1010 Type 1 diabetes mellitus with ketoacidosis without coma: Secondary | ICD-10-CM

## 2018-12-31 DIAGNOSIS — E1043 Type 1 diabetes mellitus with diabetic autonomic (poly)neuropathy: Secondary | ICD-10-CM | POA: Diagnosis not present

## 2018-12-31 LAB — POCT GLYCOSYLATED HEMOGLOBIN (HGB A1C): Hemoglobin A1C: 9.4 % — AB (ref 4.0–5.6)

## 2018-12-31 MED ORDER — DEXCOM G6 SENSOR MISC: 1.0000 | 2 refills | Status: DC

## 2018-12-31 MED ORDER — DEXCOM G6 SENSOR MISC: 1.0000 | 3 refills | Status: DC

## 2018-12-31 MED ORDER — INSULIN LISPRO (1 UNIT DIAL) 100 UNIT/ML (KWIKPEN): 6.0000 [IU] | PEN_INJECTOR | Freq: Three times a day (TID) | SUBCUTANEOUS | 11 refills | Status: DC

## 2018-12-31 MED ORDER — LANTUS SOLOSTAR 100 UNIT/ML ~~LOC~~ SOPN: 10.0000 [IU] | PEN_INJECTOR | Freq: Every day | SUBCUTANEOUS | 99 refills | Status: DC

## 2018-12-31 NOTE — Patient Instructions (Addendum)
Please change the insulins to the numbers listed below. I have sent a prescription to your pharmacy, for continuous glucose monitor sensors.   Please come back for a follow-up appointment in 2 weeks.

## 2018-12-31 NOTE — Progress Notes (Signed)
Subjective:    Patient ID: [name], female    DOB: [birth year], 28 y.o.   MRN: [id number]  HPI Pt returns for f/u of diabetes mellitus: DM type: 1 Dx'ed: 0086 Complications: gastroparesis Therapy: insulin since dx GDM: never DKA: last episode was in 2018 Severe hypoglycemia: last episode was in 2018.    Pancreatitis: never Pancreatic imaging: normal on 2019 CT.   Other: she takes multiple daily injections.   Interval history: she was back in the hospital, with vomiting and mild DKA.  She feels better now.  She takes lantus 12 units qhs, and novolog, 5 units 3 times a day (just before each meal).  no cbg record, but states cbg's vary from 75-220.  It is in general higher as the day goes on.   Past Medical History:  Diagnosis Date  . Allergy   . Anemia   . Anxiety   . Blood transfusion without reported diagnosis    Dec 2018  . Cataract    right eye  . Depression   . Diabetes type 1, uncontrolled ([place]) 11/14/2011   Since age 89  . Fibromyalgia   . Gastroparesis   . GERD (gastroesophageal reflux disease)   . Hypertension   . Infection    UTI April 2016    Past Surgical History:  Procedure Laterality Date  . ANKLE SURGERY    . CHOLECYSTECTOMY  11/15/2011   Procedure: LAPAROSCOPIC CHOLECYSTECTOMY WITH INTRAOPERATIVE CHOLANGIOGRAM;  Surgeon: [name], MD;  Location: [facility];  Service: General;  Laterality: N/A;  . COLONOSCOPY    . COLONOSCOPY WITH PROPOFOL N/A 06/27/2017   Procedure: COLONOSCOPY WITH PROPOFOL;  Surgeon: Milus Banister, MD;  [facility]: [facility];  Service: Endoscopy;  Laterality: N/A;  . ESOPHAGOGASTRODUODENOSCOPY  12/03/2011   Procedure: ESOPHAGOGASTRODUODENOSCOPY (EGD);  Surgeon: Beryle Beams, MD;  Location: [facility];  Service: Endoscopy;  Laterality: N/A;  . FLEXIBLE SIGMOIDOSCOPY N/A 03/10/2017   Procedure: FLEXIBLE SIGMOIDOSCOPY;  Surgeon: Carol Ada, MD;  [facility];  Service: Endoscopy;  Laterality: N/A;  . INCISION  AND DRAINAGE PERIRECTAL ABSCESS N/A 03/01/2017   Procedure: IRRIGATION AND DEBRIDEMENT PERIRECTAL ABSCESS;  Surgeon: Alphonsa Overall, MD;  [facility]: [facility];  Service: General;  Laterality: N/A;  . IRRIGATION AND DEBRIDEMENT BUTTOCKS N/A 03/23/2017   Procedure: IRRIGATION AND DEBRIDEMENT BUTTOCKS, SETON PLACEMENT;  Surgeon: [name], MD;  Location: [facility];  Service: General;  Laterality: N/A;  . LAPAROSCOPY  11/23/2011   Procedure: LAPAROSCOPY DIAGNOSTIC;  Surgeon: Edward Jolly, MD;  [facility]: [facility];  Service: General;  Laterality: N/A;  . SIGMOIDOSCOPY    . UPPER GASTROINTESTINAL ENDOSCOPY    . WISDOM TOOTH EXTRACTION      Social History   Socioeconomic History  . Marital status: Married    Spouse name: sergio  . Number of children: 0  . Years of education: college  . Highest education level: Not on file  Occupational History  . Occupation: Publishing rights manager call center    Employer: [employer]  . Financial resource strain: Not on file  . Food insecurity    Worry: Not on file    Inability: Not on file  . Transportation needs    Medical: Not on file    Non-medical: Not on file  Tobacco Use  . Smoking status: Never Smoker  . Smokeless tobacco: Never Used  Substance and Sexual Activity  . Alcohol use: No    Alcohol/week: 0.0 standard drinks  Frequency: Never  . Drug use: No  . Sexual activity: Not Currently    Partners: Male    Birth control/protection: None  Lifestyle  . Physical activity    Days per week: Not on file    Minutes per session: Not on file  . Stress: Not on file  Relationships  . Social Musician on phone: Not on file    Gets together: Not on file    Attends religious service: Not on file    Active member of club or organization: Not on file    Attends meetings of clubs or organizations: Not on file    Relationship status: Not on file  . Intimate partner violence    Fear of current or ex partner: Not on file     Emotionally abused: Not on file    Physically abused: Not on file    Forced sexual activity: Not on file  Other Topics Concern  . Not on file  Social History Narrative  . Not on file    Current Outpatient Medications on File Prior to Visit  Medication Sig Dispense Refill  . diclofenac (VOLTAREN) 75 MG EC tablet Take 1 tablet (75 mg total) by mouth daily.    Marland Kitchen dicyclomine (BENTYL) 20 MG tablet Take 1 tablet (20 mg total) by mouth 4 (four) times daily -  before meals and at bedtime. 60 tablet 0  . furosemide (LASIX) 40 MG tablet TAKE 1 TABLET BY MOUTH  DAILY (Patient taking differently: Take 40 mg by mouth daily. ) 90 tablet 3  . hydrALAZINE (APRESOLINE) 50 MG tablet Take 1 tablet (50 mg total) by mouth every 6 (six) hours. 120 tablet 0  . LORazepam (ATIVAN) 0.5 MG tablet Take 1 tablet (0.5 mg total) by mouth 2 (two) times daily as needed for anxiety. 60 tablet 3  . metoCLOPramide (REGLAN) 10 MG tablet Take 1 tablet (10 mg total) by mouth every 6 (six) hours as needed for nausea or vomiting. 20 tablet 0  . metoprolol tartrate (LOPRESSOR) 25 MG tablet Take 1 tablet (25 mg total) by mouth 2 (two) times daily. 60 tablet 0  . mirtazapine (REMERON) 30 MG tablet Take 1 tablet (30 mg total) by mouth at bedtime. 30 tablet 5  . ondansetron (ZOFRAN) 4 MG tablet Take 1 tablet (4 mg total) by mouth every 8 (eight) hours as needed for nausea or vomiting (If necessary, dont combine with Reglan.). 20 tablet 0  . pantoprazole (PROTONIX) 40 MG tablet TAKE 1 TABLET BY MOUTH  DAILY (Patient taking differently: Take 40 mg by mouth daily. ) 90 tablet 3  . potassium chloride 20 MEQ TBCR Take 20 mEq by mouth 2 (two) times daily. 14 tablet 0  . promethazine (PHENERGAN) 12.5 MG tablet Take 1 tablet (12.5 mg total) by mouth every 8 (eight) hours as needed for nausea. 60 tablet 1  . promethazine (PHENERGAN) 25 MG suppository Place 1 suppository (25 mg total) rectally every 8 (eight) hours as needed for nausea or  vomiting. 30 suppository 0  . traMADol (ULTRAM) 50 MG tablet Take 1 tablet (50 mg total) by mouth every 6 (six) hours as needed for moderate pain or severe pain. 14 tablet 0  . Continuous Blood Gluc Receiver (DEXCOM G6 RECEIVER) DEVI USE AS DIRECTED TO MONITOR GLUCOSE LEVELS    . Continuous Blood Gluc Transmit (DEXCOM G6 TRANSMITTER) MISC USE AS DIRECTED TO MONITOR GLUCOSE LEVELS     No current facility-administered medications on file prior  to visit.     Allergies  Allergen Reactions  . Other Anaphylaxis    Reaction to [place] nuts   . Lactose Intolerance (Gi) Diarrhea    Family History  Problem Relation Age of Onset  . Diabetes Mother   . Hypertension Father   . Colon cancer Paternal Grandmother        pt thinks PGM was dx in her 46's  . Diabetes Paternal Grandmother   . Diabetes Maternal Grandmother   . Diabetes Maternal Grandfather   . Diabetes Paternal Grandfather   . Diabetes Other   . Esophageal cancer Neg Hx   . Liver cancer Neg Hx   . Pancreatic cancer Neg Hx   . Stomach cancer Neg Hx   . Rectal cancer Neg Hx     BP 122/80 (BP Location: Left Arm, Patient Position: Sitting, Cuff Size: Normal)   Pulse (!) 105   Ht 5\' 7"  (1.702 m)   Wt 146 lb 9.6 oz (66.5 kg)   SpO2 98%   BMI 22.96 kg/m    Review of Systems She denies hypoglycemia.      Objective:   Physical Exam VITAL SIGNS:  See vs page GENERAL: no distress Pulses: dorsalis pedis intact bilat.   MSK: no deformity of the feet CV: 2+ bilat leg edema Skin:  no ulcer on the feet.  normal color and temp on the feet. Neuro: sensation is intact to touch on the feet Ext: there is bilateral onychomycosis of the toenails.   Lab Results  Component Value Date   HGBA1C 9.4 (A) 12/31/2018   Lab Results  Component Value Date   CREATININE 0.78 12/25/2018   BUN 12 12/25/2018   NA 137 12/25/2018   K 4.2 12/25/2018   CL 103 12/25/2018   CO2 24 12/25/2018        Assessment & Plan:  Type 1 DM, with  gastroparesis Noncompliance with insulin, worse:  I advised pt to change to just 1 qd insulin, but she declines.   Patient Instructions  Please change the insulins to the numbers listed below. I have sent a prescription to your pharmacy, for continuous glucose monitor sensors.   Please come back for a follow-up appointment in 2 weeks.

## 2019-01-04 ENCOUNTER — Encounter: Payer: Self-pay | Admitting: Family Medicine

## 2019-01-04 ENCOUNTER — Ambulatory Visit (INDEPENDENT_AMBULATORY_CARE_PROVIDER_SITE_OTHER): Payer: 59 | Admitting: Family Medicine

## 2019-01-04 DIAGNOSIS — E1069 Type 1 diabetes mellitus with other specified complication: Secondary | ICD-10-CM | POA: Diagnosis not present

## 2019-01-04 DIAGNOSIS — E1159 Type 2 diabetes mellitus with other circulatory complications: Secondary | ICD-10-CM

## 2019-01-04 DIAGNOSIS — F330 Major depressive disorder, recurrent, mild: Secondary | ICD-10-CM

## 2019-01-04 DIAGNOSIS — K2100 Gastro-esophageal reflux disease with esophagitis, without bleeding: Secondary | ICD-10-CM

## 2019-01-04 DIAGNOSIS — E1043 Type 1 diabetes mellitus with diabetic autonomic (poly)neuropathy: Secondary | ICD-10-CM

## 2019-01-04 DIAGNOSIS — M545 Low back pain, unspecified: Secondary | ICD-10-CM

## 2019-01-04 DIAGNOSIS — K3184 Gastroparesis: Secondary | ICD-10-CM

## 2019-01-04 DIAGNOSIS — R1115 Cyclical vomiting syndrome unrelated to migraine: Secondary | ICD-10-CM | POA: Diagnosis not present

## 2019-01-04 DIAGNOSIS — I10 Essential (primary) hypertension: Secondary | ICD-10-CM

## 2019-01-04 DIAGNOSIS — I152 Hypertension secondary to endocrine disorders: Secondary | ICD-10-CM

## 2019-01-04 DIAGNOSIS — E43 Unspecified severe protein-calorie malnutrition: Secondary | ICD-10-CM

## 2019-01-04 MED ORDER — TRAMADOL HCL 50 MG PO TABS: 50.0000 mg | ORAL_TABLET | Freq: Four times a day (QID) | ORAL | 0 refills | Status: DC | PRN

## 2019-01-04 MED ORDER — CYCLOBENZAPRINE HCL 10 MG PO TABS: 10.0000 mg | ORAL_TABLET | Freq: Three times a day (TID) | ORAL | 0 refills | Status: DC | PRN

## 2019-01-04 NOTE — Assessment & Plan Note (Signed)
Doing well currently.  Again discussed importance of good glycemic control.  Hopefully be able to see [facility] for discussion of gastric pacemaker soon.

## 2019-01-04 NOTE — Assessment & Plan Note (Signed)
Continue management per endocrinology.  Stressed importance of good glycemic control.

## 2019-01-04 NOTE — Assessment & Plan Note (Signed)
Doing well.  Continue Remeron 30 mg daily.

## 2019-01-04 NOTE — Progress Notes (Signed)
Chief Complaint:  [name] is a 28 y.o. female who presents today for a VIRTUAL TCM visit.  Assessment/Plan:  Diabetes mellitus type 1 (HCC) Continue management per endocrinology.  Stressed importance of good glycemic control.  Cyclical vomiting syndrome Doing well currently.  Again discussed importance of good glycemic control.  Hopefully be able to see [facility] for discussion of gastric pacemaker soon.  GERD (gastroesophageal reflux disease) Stable.  Continue Protonix 40 mg daily.  Advised patient she could take an extra dose if needed.  MDD (major depressive disorder), recurrent episode, mild (HCC) Doing well.  Continue Remeron 30 mg daily.  Protein-calorie malnutrition, severe (HCC) Continue protein shakes.  Hypertension associated with diabetes (HCC) Continue losartan 25 mg daily.   Back Pain No red flags.  Will refill short supply of tramadol and Flexeril.    Subjective:  HPI:  Summary of Hospital admission: Reason for admission: Gastroparesis Date of admission: 12/23/2018 Date of discharge: 12/25/2018 Date of Interactive contact: 12/27/2018 Summary of Hospital course: Patient presented to the hospital 12/23/2018 with intractable nausea and vomiting and epigastric abdominal pain.  She was also found to be in mild DKA on admission.  Initial blood sugars were in the 600s.  She was given IV insulin and antiemetics.  Her blood sugar gradually improved  her nausea vomiting gradually improved and she was discharged home on 12/25/2018.  Interim history outlined by problem:   #Gastroparesis  -Currently follows with GI and is undergoing evaluation at [facility] for possible GI pacemaker -She currently takes Reglan, Zofran, and Phenergan as needed for nausea.  She also takes hyoscyamine as needed for abdominal cramping. -She is on twice daily ativan which seems to be helping  #Type 1 diabetes -Follows with endocrinology, on basal/bolus regimen  - Will be  following up with them in a couple of weeks again  # Depression/anxiety - On Remeron 30mg  nightly and ativan 0.5mg  twice daily and doing well. - ROS: No reported SI or HI   # GERD -On Protonix 40 mg daily and tolerating well  # Essential Hypertension -On losartan 25 mg daily and tolerating well. -ROS: No reported chest pain or shortness of breath  # Peripheral Edema -On Lasix 40 mg daily.  # Protein Calorie Malnutrition -Uses supplemental shakes 1-2 times daily.  # Back Pain She has had some low back pain since being discharged from the hospital.  This usually occurs after she has a severe episode of intractable nausea and vomiting due to retching.  She has been treated with Flexeril and tramadol in the past and has done well with this.  ROS: Per HPI, otherwise a complete review of systems was negative.   PMH:  The following were reviewed and entered/updated in epic: Past Medical History:  Diagnosis Date  . Allergy   . Anemia   . Anxiety   . Blood transfusion without reported diagnosis    Dec 2018  . Cataract    right eye  . Depression   . Diabetes type 1, uncontrolled (HCC) 11/14/2011   Since age 65  . Fibromyalgia   . Gastroparesis   . GERD (gastroesophageal reflux disease)   . Hypertension   . Infection    UTI April 2016   Patient Active Problem List   Diagnosis Date Noted  . Contraception management 08/06/2018  . Cyclical vomiting syndrome 06/07/2018  . Diabetic gastroparesis associated with type 1 diabetes mellitus (HCC) 06/04/2018  . Anxiety 05/31/2018  . Peripheral edema 05/31/2018  .  Normocytic anemia 07/10/2017  . GERD (gastroesophageal reflux disease) 06/23/2017  . MDD (major depressive disorder), recurrent episode, mild ([place]) 02/28/2017  . Diabetes mellitus type 1 ([place]) 04/28/2013  . Protein-calorie malnutrition, severe ([place]) 04/28/2013  . Hypertension associated with diabetes ([place]) 11/14/2011  . Chronic abdominal pain 09/18/2011   Past  Surgical History:  Procedure Laterality Date  . ANKLE SURGERY    . CHOLECYSTECTOMY  11/15/2011   Procedure: LAPAROSCOPIC CHOLECYSTECTOMY WITH INTRAOPERATIVE CHOLANGIOGRAM;  Surgeon: [name], MD;  Location: [facility];  Service: General;  Laterality: N/A;  . COLONOSCOPY    . COLONOSCOPY WITH PROPOFOL N/A 06/27/2017   Procedure: COLONOSCOPY WITH PROPOFOL;  Surgeon: Milus Banister, MD;  [facility];  Service: Endoscopy;  Laterality: N/A;  . ESOPHAGOGASTRODUODENOSCOPY  12/03/2011   Procedure: ESOPHAGOGASTRODUODENOSCOPY (EGD);  Surgeon: Beryle Beams, MD;  Location: [facility];  Service: Endoscopy;  Laterality: N/A;  . FLEXIBLE SIGMOIDOSCOPY N/A 03/10/2017   Procedure: FLEXIBLE SIGMOIDOSCOPY;  Surgeon: Carol Ada, MD;  [facility];  Service: Endoscopy;  Laterality: N/A;  . INCISION AND DRAINAGE PERIRECTAL ABSCESS N/A 03/01/2017   Procedure: IRRIGATION AND DEBRIDEMENT PERIRECTAL ABSCESS;  Surgeon: Alphonsa Overall, MD;  [facility]: [facility];  Service: General;  Laterality: N/A;  . IRRIGATION AND DEBRIDEMENT BUTTOCKS N/A 03/23/2017   Procedure: IRRIGATION AND DEBRIDEMENT BUTTOCKS, SETON PLACEMENT;  Surgeon: [name], MD;  [facility];  Service: General;  Laterality: N/A;  . LAPAROSCOPY  11/23/2011   Procedure: LAPAROSCOPY DIAGNOSTIC;  Surgeon: Edward Jolly, MD;  [facility];  Service: General;  Laterality: N/A;  . SIGMOIDOSCOPY    . UPPER GASTROINTESTINAL ENDOSCOPY    . WISDOM TOOTH EXTRACTION      Family History  Problem Relation Age of Onset  . Diabetes Mother   . Hypertension Father   . Colon cancer Paternal Grandmother        pt thinks PGM was dx in her 47's  . Diabetes Paternal Grandmother   . Diabetes Maternal Grandmother   . Diabetes Maternal Grandfather   . Diabetes Paternal Grandfather   . Diabetes Other   . Esophageal cancer Neg Hx   . Liver cancer Neg Hx   . Pancreatic cancer Neg Hx   . Stomach cancer Neg Hx   . Rectal cancer Neg  Hx     Medications- Reconciled discharge and current medications in Epic.  Current Outpatient Medications  Medication Sig Dispense Refill  . Continuous Blood Gluc Receiver (DEXCOM G6 RECEIVER) DEVI USE AS DIRECTED TO MONITOR GLUCOSE LEVELS    . Continuous Blood Gluc Sensor (DEXCOM G6 SENSOR) MISC 1 each by Does not apply route See admin instructions. every 10 days. 9 each 3  . Continuous Blood Gluc Transmit (DEXCOM G6 TRANSMITTER) MISC USE AS DIRECTED TO MONITOR GLUCOSE LEVELS    . dicyclomine (BENTYL) 20 MG tablet Take 1 tablet (20 mg total) by mouth 4 (four) times daily -  before meals and at bedtime. 60 tablet 0  . furosemide (LASIX) 40 MG tablet TAKE 1 TABLET BY MOUTH  DAILY (Patient taking differently: Take 40 mg by mouth daily. ) 90 tablet 3  . hydrALAZINE (APRESOLINE) 50 MG tablet Take 1 tablet (50 mg total) by mouth every 6 (six) hours. 120 tablet 0  . Insulin Glargine (LANTUS SOLOSTAR) 100 UNIT/ML Solostar Pen Inject 10 Units into the skin at bedtime. And pen needles 4/day 5 pen PRN  . insulin lispro (HUMALOG KWIKPEN) 100 UNIT/ML KwikPen Inject 0.06 mLs (6 Units total) into  the skin 3 (three) times daily. 5 pen 11  . LORazepam (ATIVAN) 0.5 MG tablet Take 1 tablet (0.5 mg total) by mouth 2 (two) times daily as needed for anxiety. 60 tablet 3  . metoCLOPramide (REGLAN) 10 MG tablet Take 1 tablet (10 mg total) by mouth every 6 (six) hours as needed for nausea or vomiting. 20 tablet 0  . metoprolol tartrate (LOPRESSOR) 25 MG tablet Take 1 tablet (25 mg total) by mouth 2 (two) times daily. 60 tablet 0  . mirtazapine (REMERON) 30 MG tablet Take 1 tablet (30 mg total) by mouth at bedtime. 30 tablet 5  . ondansetron (ZOFRAN) 4 MG tablet Take 1 tablet (4 mg total) by mouth every 8 (eight) hours as needed for nausea or vomiting (If necessary, dont combine with Reglan.). 20 tablet 0  . pantoprazole (PROTONIX) 40 MG tablet TAKE 1 TABLET BY MOUTH  DAILY (Patient taking differently: Take 40 mg by mouth  daily. ) 90 tablet 3  . potassium chloride 20 MEQ TBCR Take 20 mEq by mouth 2 (two) times daily. 14 tablet 0  . promethazine (PHENERGAN) 12.5 MG tablet Take 1 tablet (12.5 mg total) by mouth every 8 (eight) hours as needed for nausea. 60 tablet 1  . promethazine (PHENERGAN) 25 MG suppository Place 1 suppository (25 mg total) rectally every 8 (eight) hours as needed for nausea or vomiting. 30 suppository 0  . traMADol (ULTRAM) 50 MG tablet Take 1 tablet (50 mg total) by mouth every 6 (six) hours as needed for moderate pain or severe pain. 14 tablet 0  . cyclobenzaprine (FLEXERIL) 10 MG tablet Take 1 tablet (10 mg total) by mouth 3 (three) times daily as needed for muscle spasms. 30 tablet 0   No current facility-administered medications for this visit.     Allergies-reviewed and updated Allergies  Allergen Reactions  . Other Anaphylaxis    Reaction to [place] nuts   . Lactose Intolerance (Gi) Diarrhea    Social History   Socioeconomic History  . Marital status: Married    Spouse name: sergio  . Number of children: 0  . Years of education: college  . Highest education level: Not on file  Occupational History  . Occupation: Equities trader call center    Employer: [employer]  Social Needs  . Financial resource strain: Not on file  . Food insecurity    Worry: Not on file    Inability: Not on file  . Transportation needs    Medical: Not on file    Non-medical: Not on file  Tobacco Use  . Smoking status: Never Smoker  . Smokeless tobacco: Never Used  Substance and Sexual Activity  . Alcohol use: No    Alcohol/week: 0.0 standard drinks    Frequency: Never  . Drug use: No  . Sexual activity: Not Currently    Partners: Male    Birth control/protection: None  Lifestyle  . Physical activity    Days per week: Not on file    Minutes per session: Not on file  . Stress: Not on file  Relationships  . Social Musician on phone: Not on file    Gets together: Not  on file    Attends religious service: Not on file    Active member of club or organization: Not on file    Attends meetings of clubs or organizations: Not on file    Relationship status: Not on file  Other Topics Concern  . Not on  file  Social History Narrative  . Not on file        Objective:  Physical Exam: Gen: NAD, resting comfortably Pulm: NWOB Neuro: Grossly normal, moves all extremities Psych: Normal affect and thought content   Virtual Visit via Video   I connected with [name] on 01/04/19 at 11:00 AM EDT by a video enabled telemedicine application and verified that I am speaking with the correct person using two identifiers. I discussed the limitations of evaluation and management by telemedicine and the availability of in person appointments. The patient expressed understanding and agreed to proceed.   Patient location: Home Provider location: [place] [employer] Persons participating in the virtual visit: Myself and Patient      Katina Degree. Jimmey Ralph, MD 01/04/2019 11:57 AM

## 2019-01-04 NOTE — Assessment & Plan Note (Signed)
Stable.  Continue Protonix 40 mg daily.  Advised patient she could take an extra dose if needed.

## 2019-01-04 NOTE — Assessment & Plan Note (Signed)
Continue protein shakes.

## 2019-01-04 NOTE — Assessment & Plan Note (Signed)
Continue losartan 25mg daily

## 2019-01-08 ENCOUNTER — Other Ambulatory Visit: Payer: Self-pay

## 2019-01-08 ENCOUNTER — Emergency Department (HOSPITAL_COMMUNITY)
Admission: EM | Admit: 2019-01-08 | Discharge: 2019-01-08 | Disposition: A | Discharge status: Home / Self Care | Payer: 59 | Attending: Emergency Medicine | Admitting: Emergency Medicine

## 2019-01-08 ENCOUNTER — Encounter (HOSPITAL_COMMUNITY): Payer: Self-pay

## 2019-01-08 DIAGNOSIS — E162 Hypoglycemia, unspecified: Secondary | ICD-10-CM

## 2019-01-08 DIAGNOSIS — Z79899 Other long term (current) drug therapy: Secondary | ICD-10-CM | POA: Insufficient documentation

## 2019-01-08 DIAGNOSIS — E10649 Type 1 diabetes mellitus with hypoglycemia without coma: Secondary | ICD-10-CM | POA: Insufficient documentation

## 2019-01-08 DIAGNOSIS — I10 Essential (primary) hypertension: Secondary | ICD-10-CM | POA: Diagnosis not present

## 2019-01-08 DIAGNOSIS — R4189 Other symptoms and signs involving cognitive functions and awareness: Secondary | ICD-10-CM

## 2019-01-08 DIAGNOSIS — R4182 Altered mental status, unspecified: Secondary | ICD-10-CM | POA: Diagnosis present

## 2019-01-08 LAB — BASIC METABOLIC PANEL
Anion gap: 8 (ref 5–15)
BUN: 24 mg/dL — ABNORMAL HIGH (ref 6–20)
CO2: 25 mmol/L (ref 22–32)
Calcium: 8.9 mg/dL (ref 8.9–10.3)
Chloride: 106 mmol/L (ref 98–111)
Creatinine, Ser: 0.97 mg/dL (ref 0.44–1.00)
GFR calc Af Amer: >60 mL/min (ref >60)
GFR calc non Af Amer: >60 mL/min (ref >60)
Glucose, Bld: 63 mg/dL — ABNORMAL LOW (ref 70–99)
Potassium: 4.3 mmol/L (ref 3.5–5.1)
Sodium: 139 mmol/L (ref 135–145)

## 2019-01-08 LAB — CBC WITH DIFFERENTIAL/PLATELET
Abs Immature Granulocytes: 0.01 10*3/uL (ref 0.00–0.07)
Basophils Absolute: 0 10*3/uL (ref 0.0–0.1)
Basophils Relative: 1 %
Eosinophils Absolute: 0.1 10*3/uL (ref 0.0–0.5)
Eosinophils Relative: 3 %
HCT: 27.6 % — ABNORMAL LOW (ref 36.0–46.0)
Hemoglobin: 8.5 g/dL — ABNORMAL LOW (ref 12.0–15.0)
Immature Granulocytes: 0 %
Lymphocytes Relative: 56 %
Lymphs Abs: 2.1 10*3/uL (ref 0.7–4.0)
MCH: 29.7 pg (ref 26.0–34.0)
MCHC: 30.8 g/dL (ref 30.0–36.0)
MCV: 96.5 fL (ref 80.0–100.0)
Monocytes Absolute: 0.3 10*3/uL (ref 0.1–1.0)
Monocytes Relative: 8 %
Neutro Abs: 1.2 10*3/uL — ABNORMAL LOW (ref 1.7–7.7)
Neutrophils Relative %: 32 %
Platelets: 218 10*3/uL (ref 150–400)
RBC: 2.86 MIL/uL — ABNORMAL LOW (ref 3.87–5.11)
RDW: 14 % (ref 11.5–15.5)
WBC: 3.8 10*3/uL — ABNORMAL LOW (ref 4.0–10.5)
nRBC: 0 % (ref 0.0–0.2)

## 2019-01-08 LAB — CBG MONITORING, ED
Glucose-Capillary: 109 mg/dL — ABNORMAL HIGH (ref 70–99)
Glucose-Capillary: 56 mg/dL — ABNORMAL LOW (ref 70–99)
Glucose-Capillary: 80 mg/dL (ref 70–99)
Glucose-Capillary: 141 mg/dL — ABNORMAL HIGH (ref 70–99)
Glucose-Capillary: 173 mg/dL — ABNORMAL HIGH (ref 70–99)
Glucose-Capillary: 175 mg/dL — ABNORMAL HIGH (ref 70–99)
Glucose-Capillary: 278 mg/dL — ABNORMAL HIGH (ref 70–99)

## 2019-01-08 MED ORDER — DEXTROSE 50 % IV SOLN
INTRAVENOUS | Status: AC
  Filled 2019-01-08: qty 50

## 2019-01-08 MED ORDER — DEXTROSE 50 % IV SOLN
1.0000 | Freq: Once | INTRAVENOUS | Status: AC
  Administered 2019-01-08: 50 mL via INTRAVENOUS

## 2019-01-08 MED ORDER — DEXTROSE 5 % IV SOLN
INTRAVENOUS | Status: DC
  Administered 2019-01-08: 18:00:00 via INTRAVENOUS

## 2019-01-08 MED ORDER — LIDOCAINE HCL (PF) 1 % IJ SOLN
5.0000 mL | Freq: Once | INTRAMUSCULAR | Status: AC
  Administered 2019-01-08: 5 mL

## 2019-01-08 NOTE — ED Provider Notes (Signed)
[facility] DEPT Provider Note   CSN: [id number] Arrival date & time: 01/08/19  1615     History   Chief Complaint No chief complaint on file.   HPI [name] is a 28 y.o. female.     HPI   28 year old female brought in unresponsive.  CBG 14.  Difficult to obtain peripheral IV access.  Right IO was started.  She was given amp of D50.  Alert and responding within a couple minutes after.  She is unsure what led up to coming to the emergency room, but this is not surprising.  She does remember taking her insulin this morning.  She remembers eating a small amount of chicken.  Says her blood sugars have been "up and down."   Past Medical History:  Diagnosis Date  . Allergy   . Anemia   . Anxiety   . Blood transfusion without reported diagnosis    Dec 2018  . Cataract    right eye  . Depression   . Diabetes type 1, uncontrolled ([place]) 11/14/2011   Since age 22  . Fibromyalgia   . Gastroparesis   . GERD (gastroesophageal reflux disease)   . Hypertension   . Infection    UTI April 2016    Patient Active Problem List   Diagnosis Date Noted  . Contraception management 08/06/2018  . Cyclical vomiting syndrome 06/07/2018  . Diabetic gastroparesis associated with type 1 diabetes mellitus ([place]) 06/04/2018  . Anxiety 05/31/2018  . Peripheral edema 05/31/2018  . Normocytic anemia 07/10/2017  . GERD (gastroesophageal reflux disease) 06/23/2017  . 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 Diabetes mellitus type 1 ([place]) 04/28/2013  . Protein-calorie malnutrition, severe ([place]) 04/28/2013  . Hypertension associated with diabetes ([place]) 11/14/2011  . Chronic abdominal pain 09/18/2011    Past Surgical History:  Procedure Laterality Date  . ANKLE SURGERY    . CHOLECYSTECTOMY  11/15/2011   Procedure: LAPAROSCOPIC CHOLECYSTECTOMY WITH INTRAOPERATIVE CHOLANGIOGRAM;  Surgeon: [name], MD;  Location: [facility];   Service: General;  Laterality: N/A;  . COLONOSCOPY    . COLONOSCOPY WITH PROPOFOL N/A 06/27/2017   Procedure: COLONOSCOPY WITH PROPOFOL;  Surgeon: Milus Banister, MD;  Location: [facility];  Service: Endoscopy;  Laterality: N/A;  . ESOPHAGOGASTRODUODENOSCOPY  12/03/2011   Procedure: ESOPHAGOGASTRODUODENOSCOPY (EGD);  Surgeon: Beryle Beams, MD;  Location: [facility];  Service: Endoscopy;  Laterality: N/A;  . FLEXIBLE SIGMOIDOSCOPY N/A 03/10/2017   Procedure: FLEXIBLE SIGMOIDOSCOPY;  Surgeon: Carol Ada, MD;  Location: [facility];  Service: Endoscopy;  Laterality: N/A;  . INCISION AND DRAINAGE PERIRECTAL ABSCESS N/A 03/01/2017   Procedure: IRRIGATION AND DEBRIDEMENT PERIRECTAL ABSCESS;  Surgeon: Alphonsa Overall, MD;  Location: [facility];  Service: General;  Laterality: N/A;  . IRRIGATION AND DEBRIDEMENT BUTTOCKS N/A 03/23/2017   Procedure: IRRIGATION AND DEBRIDEMENT BUTTOCKS, SETON PLACEMENT;  Surgeon: [name], MD;  Location: [facility];  Service: General;  Laterality: N/A;  . LAPAROSCOPY  11/23/2011   Procedure: LAPAROSCOPY DIAGNOSTIC;  Surgeon: Edward Jolly, MD;  Location: [facility];  Service: General;  Laterality: N/A;  . SIGMOIDOSCOPY    . UPPER GASTROINTESTINAL ENDOSCOPY    . WISDOM TOOTH EXTRACTION       OB History    Gravida  1   Para  1   Term      Preterm  1   AB      Living  1     SAB  TAB      Ectopic      Multiple  0   Live Births  1            Home Medications    Prior to Admission medications   Medication Sig Start Date End Date Taking? Authorizing Provider  Continuous Blood Gluc Receiver (DEXCOM G6 RECEIVER) DEVI USE AS DIRECTED TO MONITOR GLUCOSE LEVELS 10/29/18   [provider]  Continuous Blood Gluc Sensor (DEXCOM G6 SENSOR) MISC 1 each by Does not apply route See admin instructions. every 10 days. 12/31/18   Renato Shin, MD  Continuous Blood Gluc Transmit (DEXCOM G6 TRANSMITTER) MISC USE AS DIRECTED TO MONITOR GLUCOSE  LEVELS 10/29/18   [provider]  cyclobenzaprine (FLEXERIL) 10 MG tablet Take 1 tablet (10 mg total) by mouth 3 (three) times daily as needed for muscle spasms. 01/04/19   [name], MD  dicyclomine (BENTYL) 20 MG tablet Take 1 tablet (20 mg total) by mouth 4 (four) times daily -  before meals and at bedtime. 09/21/18   Aline August, MD  furosemide (LASIX) 40 MG tablet TAKE 1 TABLET BY MOUTH  DAILY Patient taking differently: Take 40 mg by mouth daily.  11/11/18   Armbruster, Carlota Raspberry, MD  hydrALAZINE (APRESOLINE) 50 MG tablet Take 1 tablet (50 mg total) by mouth every 6 (six) hours. 10/23/18   Swayze, Ava, DO  Insulin Glargine (LANTUS SOLOSTAR) 100 UNIT/ML Solostar Pen Inject 10 Units into the skin at bedtime. And pen needles 4/day 12/31/18   Renato Shin, MD  insulin lispro (HUMALOG KWIKPEN) 100 UNIT/ML KwikPen Inject 0.06 mLs (6 Units total) into the skin 3 (three) times daily. 12/31/18   Renato Shin, MD  LORazepam (ATIVAN) 0.5 MG tablet Take 1 tablet (0.5 mg total) by mouth 2 (two) times daily as needed for anxiety. 10/28/18   [name], MD  metoCLOPramide (REGLAN) 10 MG tablet Take 1 tablet (10 mg total) by mouth every 6 (six) hours as needed for nausea or vomiting. 02/20/18   Shelly Coss, MD  metoprolol tartrate (LOPRESSOR) 25 MG tablet Take 1 tablet (25 mg total) by mouth 2 (two) times daily. 10/23/18   Swayze, Ava, DO  mirtazapine (REMERON) 30 MG tablet Take 1 tablet (30 mg total) by mouth at bedtime. 05/31/18   [name], MD  ondansetron (ZOFRAN) 4 MG tablet Take 1 tablet (4 mg total) by mouth every 8 (eight) hours as needed for nausea or vomiting (If necessary, dont combine with Reglan.). 09/21/18   Aline August, MD  pantoprazole (PROTONIX) 40 MG tablet TAKE 1 TABLET BY MOUTH  DAILY Patient taking differently: Take 40 mg by mouth daily.  10/22/18   Armbruster, Carlota Raspberry, MD  potassium chloride 20 MEQ TBCR Take 20 mEq by mouth 2 (two) times daily. 10/17/18   Isla Pence, MD  promethazine (PHENERGAN) 12.5 MG tablet Take 1 tablet (12.5 mg total) by mouth every 8 (eight) hours as needed for nausea. 11/22/18   Armbruster, Carlota Raspberry, MD  promethazine (PHENERGAN) 25 MG suppository Place 1 suppository (25 mg total) rectally every 8 (eight) hours as needed for nausea or vomiting. 12/17/18   Armbruster, Carlota Raspberry, MD  traMADol (ULTRAM) 50 MG tablet Take 1 tablet (50 mg total) by mouth every 6 (six) hours as needed for moderate pain or severe pain. 01/04/19   [name], MD    Family History Family History  Problem Relation Age of Onset  . Diabetes Mother   .  Hypertension Father   . Colon cancer Paternal Grandmother        pt thinks PGM was dx in her 66's  . Diabetes Paternal Grandmother   . Diabetes Maternal Grandmother   . Diabetes Maternal Grandfather   . Diabetes Paternal Grandfather   . Diabetes Other   . Esophageal cancer Neg Hx   . Liver cancer Neg Hx   . Pancreatic cancer Neg Hx   . Stomach cancer Neg Hx   . Rectal cancer Neg Hx     Social History Social History   Tobacco Use  . Smoking status: Never Smoker  . Smokeless tobacco: Never Used  Substance Use Topics  . Alcohol use: No    Alcohol/week: 0.0 standard drinks    Frequency: Never  . Drug use: No     Allergies   Other and Lactose intolerance (gi)   Review of Systems Review of Systems  All systems reviewed and negative, other than as noted in HPI.  Physical Exam Updated Vital Signs There were no vitals taken for this visit.  Physical Exam Vitals signs and nursing note reviewed.  Constitutional:      General: She is not in acute distress.    Appearance: She is well-developed.  HENT:     Head: Normocephalic and atraumatic.  Eyes:     General:        Right eye: No discharge.        Left eye: No discharge.     Conjunctiva/sclera: Conjunctivae normal.  Neck:     Musculoskeletal: Neck supple.  Cardiovascular:     Rate and Rhythm: Normal rate and regular rhythm.      Heart sounds: Normal heart sounds. No murmur. No friction rub. No gallop.   Pulmonary:     Effort: Pulmonary effort is normal. No respiratory distress.     Breath sounds: Normal breath sounds.  Abdominal:     General: There is no distension.     Palpations: Abdomen is soft.     Tenderness: There is no abdominal tenderness.  Musculoskeletal:        General: No tenderness.  Skin:    General: Skin is warm and dry.  Neurological:     Mental Status: She is alert.  Psychiatric:        Behavior: Behavior normal.        Thought Content: Thought content normal.      ED Treatments / Results  Labs (all labs ordered are listed, but only abnormal results are displayed) Labs Reviewed  CBC WITH DIFFERENTIAL/PLATELET - Abnormal; Notable for the following components:      Result Value   WBC 3.8 (*)    RBC 2.86 (*)    Hemoglobin 8.5 (*)    HCT 27.6 (*)    Neutro Abs 1.2 (*)    All other components within normal limits  BASIC METABOLIC PANEL - Abnormal; Notable for the following components:   Glucose, Bld 63 (*)    BUN 24 (*)    All other components within normal limits  CBG MONITORING, ED - Abnormal; Notable for the following components:   Glucose-Capillary 109 (*)    All other components within normal limits  CBG MONITORING, ED - Abnormal; Notable for the following components:   Glucose-Capillary 56 (*)    All other components within normal limits  CBG MONITORING, ED - Abnormal; Notable for the following components:   Glucose-Capillary 173 (*)    All other components within normal limits  CBG  MONITORING, ED - Abnormal; Notable for the following components:   Glucose-Capillary 141 (*)    All other components within normal limits  CBG MONITORING, ED - Abnormal; Notable for the following components:   Glucose-Capillary 175 (*)    All other components within normal limits  CBG MONITORING, ED - Abnormal; Notable for the following components:   Glucose-Capillary 278 (*)    All other  components within normal limits  CBG MONITORING, ED    EKG None  Radiology No results found.  Procedures Procedures (including critical care time)  CRITICAL CARE Performed by: Virgel Manifold Total critical care time: 35 minutes Critical care time was exclusive of separately billable procedures and treating other patients. Critical care was necessary to treat or prevent imminent or life-threatening deterioration. Critical care was time spent personally by me on the following activities: development of treatment plan with patient and/or surrogate as well as nursing, discussions with consultants, evaluation of patient's response to treatment, examination of patient, obtaining history from patient or surrogate, ordering and performing treatments and interventions, ordering and review of laboratory studies, ordering and review of radiographic studies, pulse oximetry and re-evaluation of patient's condition.   Medications Ordered in ED Medications - No data to display   Initial Impression / Assessment and Plan / ED Course  I have reviewed the triage vital signs and the nursing notes.  Pertinent labs & imaging results that were available during my care of the patient were reviewed by me and considered in my medical decision making (see chart for details).    28 year old female brought in poorly responsive.  Clinical picture consistent with profound hypoglycemia.  Expected clinical response with administration of dextrose.  Is probably likely from taking her insulin but poor p.o. intake today.  We will continue to observe her.  Serial CBGs.  Repeat hypoglycemia.  Had to be briefly put on a dextrose infusion.  This was turned off.  Several serial glucoses in normal to high range.  She is tolerating p.o.  I think she is stable/appropriate for discharge at this time.  Final Clinical Impressions(s) / ED Diagnoses   Final diagnoses:  Hypoglycemia  Unresponsive episode    ED Discharge  Orders    None       Virgel Manifold, MD 01/09/19 1209

## 2019-01-08 NOTE — ED Notes (Signed)
Dicussed wit pt calibrating CBG machine to prevent further incidents.

## 2019-01-08 NOTE — ED Triage Notes (Addendum)
Pt arrived unresponsive with husband. Husband stated pt had extremely low blood sugar. Initial CBG at 16.

## 2019-01-10 LAB — CBG MONITORING, ED: Glucose-Capillary: 16 mg/dL — CL (ref 70–99)

## 2019-01-14 ENCOUNTER — Ambulatory Visit: Payer: 59 | Admitting: Endocrinology

## 2019-01-14 DIAGNOSIS — Z0289 Encounter for other administrative examinations: Secondary | ICD-10-CM

## 2019-01-18 ENCOUNTER — Ambulatory Visit: Payer: 59 | Admitting: Nutrition

## 2019-02-07 ENCOUNTER — Other Ambulatory Visit: Payer: Self-pay | Admitting: Family Medicine

## 2019-02-09 NOTE — Telephone Encounter (Signed)
Last OV 01/04/19 Last refill Cyclobenzaprine 01/04/19 #30/0                 Lorazepam 10/28/18 #60/3                 Tramadol 01/04/19 #14/0 Next OV not scheduled  Forwarding to Dr. Jerline Pain

## 2019-02-15 ENCOUNTER — Other Ambulatory Visit: Payer: Self-pay | Admitting: Family Medicine

## 2019-02-15 ENCOUNTER — Other Ambulatory Visit: Payer: Self-pay | Admitting: Gastroenterology

## 2019-02-16 ENCOUNTER — Emergency Department (HOSPITAL_COMMUNITY)
Admission: EM | Admit: 2019-02-16 | Discharge: 2019-02-16 | Discharge status: Against Medical Advice | Payer: 59 | Source: Home / Self Care | Attending: Emergency Medicine | Admitting: Emergency Medicine

## 2019-02-16 ENCOUNTER — Emergency Department (HOSPITAL_COMMUNITY)
Admission: EM | Admit: 2019-02-16 | Discharge: 2019-02-16 | Discharge status: Against Medical Advice | Payer: 59 | Attending: Emergency Medicine | Admitting: Emergency Medicine

## 2019-02-16 ENCOUNTER — Encounter (HOSPITAL_COMMUNITY): Payer: Self-pay

## 2019-02-16 ENCOUNTER — Other Ambulatory Visit: Payer: Self-pay

## 2019-02-16 DIAGNOSIS — R1013 Epigastric pain: Secondary | ICD-10-CM | POA: Insufficient documentation

## 2019-02-16 DIAGNOSIS — Z794 Long term (current) use of insulin: Secondary | ICD-10-CM | POA: Insufficient documentation

## 2019-02-16 DIAGNOSIS — E109 Type 1 diabetes mellitus without complications: Secondary | ICD-10-CM | POA: Diagnosis present

## 2019-02-16 DIAGNOSIS — Z79899 Other long term (current) drug therapy: Secondary | ICD-10-CM | POA: Diagnosis not present

## 2019-02-16 DIAGNOSIS — R112 Nausea with vomiting, unspecified: Secondary | ICD-10-CM | POA: Insufficient documentation

## 2019-02-16 DIAGNOSIS — R739 Hyperglycemia, unspecified: Secondary | ICD-10-CM

## 2019-02-16 DIAGNOSIS — I10 Essential (primary) hypertension: Secondary | ICD-10-CM | POA: Insufficient documentation

## 2019-02-16 DIAGNOSIS — R062 Wheezing: Secondary | ICD-10-CM | POA: Insufficient documentation

## 2019-02-16 DIAGNOSIS — R197 Diarrhea, unspecified: Secondary | ICD-10-CM

## 2019-02-16 DIAGNOSIS — E739 Lactose intolerance, unspecified: Secondary | ICD-10-CM | POA: Insufficient documentation

## 2019-02-16 LAB — BLOOD GAS, VENOUS
Acid-Base Excess: 0.2 mmol/L (ref 0.0–2.0)
Bicarbonate: 25.5 mmol/L (ref 20.0–28.0)
O2 Saturation: 68.3 %
Patient temperature: 98.6
pCO2, Ven: 47.4 mmHg (ref 44.0–60.0)
pH, Ven: 7.352 (ref 7.250–7.430)
pO2, Ven: 38.2 mmHg (ref 32.0–45.0)

## 2019-02-16 LAB — CBG MONITORING, ED
Glucose-Capillary: 346 mg/dL — ABNORMAL HIGH (ref 70–99)
Glucose-Capillary: 325 mg/dL — ABNORMAL HIGH (ref 70–99)

## 2019-02-16 LAB — CBC WITH DIFFERENTIAL/PLATELET
Abs Immature Granulocytes: 0.04 10*3/uL (ref 0.00–0.07)
Basophils Absolute: 0 10*3/uL (ref 0.0–0.1)
Basophils Relative: 0 %
Eosinophils Absolute: 0 10*3/uL (ref 0.0–0.5)
Eosinophils Relative: 0 %
HCT: 34.7 % — ABNORMAL LOW (ref 36.0–46.0)
Hemoglobin: 11.2 g/dL — ABNORMAL LOW (ref 12.0–15.0)
Immature Granulocytes: 1 %
Lymphocytes Relative: 12 %
Lymphs Abs: 0.8 10*3/uL (ref 0.7–4.0)
MCH: 28 pg (ref 26.0–34.0)
MCHC: 32.3 g/dL (ref 30.0–36.0)
MCV: 86.8 fL (ref 80.0–100.0)
Monocytes Absolute: 0.1 10*3/uL (ref 0.1–1.0)
Monocytes Relative: 2 %
Neutro Abs: 6.1 10*3/uL (ref 1.7–7.7)
Neutrophils Relative %: 85 %
Platelets: 166 10*3/uL (ref 150–400)
RBC: 4 MIL/uL (ref 3.87–5.11)
RDW: 12 % (ref 11.5–15.5)
WBC: 7.1 10*3/uL (ref 4.0–10.5)
nRBC: 0 % (ref 0.0–0.2)

## 2019-02-16 LAB — COMPREHENSIVE METABOLIC PANEL
ALT: 47 U/L — ABNORMAL HIGH (ref 0–44)
ALT: 52 U/L — ABNORMAL HIGH (ref 0–44)
AST: 26 U/L (ref 15–41)
AST: 33 U/L (ref 15–41)
Albumin: 3.6 g/dL (ref 3.5–5.0)
Albumin: 4.6 g/dL (ref 3.5–5.0)
Alkaline Phosphatase: 88 U/L (ref 38–126)
Alkaline Phosphatase: 105 U/L (ref 38–126)
Anion gap: 12 (ref 5–15)
Anion gap: 17 — ABNORMAL HIGH (ref 5–15)
BUN: 24 mg/dL — ABNORMAL HIGH (ref 6–20)
BUN: 41 mg/dL — ABNORMAL HIGH (ref 6–20)
CO2: 22 mmol/L (ref 22–32)
CO2: 23 mmol/L (ref 22–32)
Calcium: 9.3 mg/dL (ref 8.9–10.3)
Calcium: 9.5 mg/dL (ref 8.9–10.3)
Chloride: 105 mmol/L (ref 98–111)
Chloride: 99 mmol/L (ref 98–111)
Creatinine, Ser: 0.94 mg/dL (ref 0.44–1.00)
Creatinine, Ser: 1.16 mg/dL — ABNORMAL HIGH (ref 0.44–1.00)
GFR calc Af Amer: >60 mL/min (ref >60)
GFR calc Af Amer: >60 mL/min (ref >60)
GFR calc non Af Amer: >60 mL/min (ref >60)
GFR calc non Af Amer: >60 mL/min (ref >60)
Glucose, Bld: 239 mg/dL — ABNORMAL HIGH (ref 70–99)
Glucose, Bld: 369 mg/dL — ABNORMAL HIGH (ref 70–99)
Potassium: 4.1 mmol/L (ref 3.5–5.1)
Potassium: 4.4 mmol/L (ref 3.5–5.1)
Sodium: 139 mmol/L (ref 135–145)
Sodium: 139 mmol/L (ref 135–145)
Total Bilirubin: 0.3 mg/dL (ref 0.3–1.2)
Total Bilirubin: 1 mg/dL (ref 0.3–1.2)
Total Protein: 7.5 g/dL (ref 6.5–8.1)
Total Protein: 9.1 g/dL — ABNORMAL HIGH (ref 6.5–8.1)

## 2019-02-16 LAB — URINALYSIS, ROUTINE W REFLEX MICROSCOPIC
Bacteria, UA: NONE SEEN
Bilirubin Urine: NEGATIVE
Bilirubin Urine: NEGATIVE
Glucose, UA: >=500 mg/dL — AB
Glucose, UA: >=500 mg/dL — AB
Ketones, ur: 5 mg/dL — AB
Ketones, ur: NEGATIVE mg/dL
Leukocytes,Ua: NEGATIVE
Leukocytes,Ua: NEGATIVE
Nitrite: NEGATIVE
Nitrite: NEGATIVE
Protein, ur: 100 mg/dL — AB
Protein, ur: >=300 mg/dL — AB
Specific Gravity, Urine: 1.014 (ref 1.005–1.030)
Specific Gravity, Urine: 1.017 (ref 1.005–1.030)
pH: 5 (ref 5.0–8.0)
pH: 5 (ref 5.0–8.0)

## 2019-02-16 LAB — I-STAT BETA HCG BLOOD, ED (MC, WL, AP ONLY)
I-stat hCG, quantitative: <5 m[IU]/mL (ref <5)
I-stat hCG, quantitative: <5 m[IU]/mL (ref <5)

## 2019-02-16 LAB — CBC
HCT: 33.7 % — ABNORMAL LOW (ref 36.0–46.0)
Hemoglobin: 10.9 g/dL — ABNORMAL LOW (ref 12.0–15.0)
MCH: 28.2 pg (ref 26.0–34.0)
MCHC: 32.3 g/dL (ref 30.0–36.0)
MCV: 87.3 fL (ref 80.0–100.0)
Platelets: 218 10*3/uL (ref 150–400)
RBC: 3.86 MIL/uL — ABNORMAL LOW (ref 3.87–5.11)
RDW: 11.9 % (ref 11.5–15.5)
WBC: 4.6 10*3/uL (ref 4.0–10.5)
nRBC: 0 % (ref 0.0–0.2)

## 2019-02-16 LAB — LIPASE, BLOOD
Lipase: 24 U/L (ref 11–51)
Lipase: 20 U/L (ref 11–51)

## 2019-02-16 MED ORDER — HALOPERIDOL LACTATE 5 MG/ML IJ SOLN
2.5000 mg | Freq: Once | INTRAMUSCULAR | Status: AC
  Administered 2019-02-16: 18:00:00 2.5 mg via INTRAVENOUS
  Filled 2019-02-16: qty 1

## 2019-02-16 MED ORDER — SODIUM CHLORIDE 0.9% FLUSH: 3.0000 mL | Freq: Once | INTRAVENOUS | Status: DC

## 2019-02-16 MED ORDER — SODIUM CHLORIDE 0.9 % IV BOLUS
1000.0000 mL | Freq: Once | INTRAVENOUS | Status: AC
  Administered 2019-02-16: 16:00:00 1000 mL via INTRAVENOUS

## 2019-02-16 MED ORDER — SODIUM CHLORIDE 0.9 % IV BOLUS
1000.0000 mL | Freq: Once | INTRAVENOUS | Status: AC
  Administered 2019-02-16: 1000 mL via INTRAVENOUS

## 2019-02-16 MED ORDER — HYDROMORPHONE HCL 1 MG/ML IJ SOLN
0.5000 mg | Freq: Once | INTRAMUSCULAR | Status: AC
  Administered 2019-02-16: 0.5 mg via INTRAVENOUS
  Filled 2019-02-16: qty 1

## 2019-02-16 MED ORDER — ONDANSETRON 4 MG PO TBDP
4.0000 mg | ORAL_TABLET | Freq: Once | ORAL | Status: AC | PRN
  Administered 2019-02-16: 4 mg via ORAL
  Filled 2019-02-16: qty 1

## 2019-02-16 MED ORDER — LIDOCAINE VISCOUS HCL 2 % MT SOLN: 15.0000 mL | Freq: Once | OROMUCOSAL | Status: DC

## 2019-02-16 MED ORDER — ALUM & MAG HYDROXIDE-SIMETH 200-200-20 MG/5ML PO SUSP: 30.0000 mL | Freq: Once | ORAL | Status: DC

## 2019-02-16 NOTE — ED Notes (Signed)
ED Provider at bedside. 

## 2019-02-16 NOTE — ED Notes (Signed)
Patient states "I'm getting ready to leave." Asked pt was she sure because we advise her to stay. She states yes

## 2019-02-16 NOTE — ED Notes (Signed)
Lab made aware VBG was sent.

## 2019-02-16 NOTE — ED Notes (Signed)
Pt ambulated independently with steady gait to restroom

## 2019-02-16 NOTE — ED Triage Notes (Signed)
Pt c.o n.v, abd pain since last night, pt tearful in triage.

## 2019-02-16 NOTE — ED Notes (Signed)
Patient provided with water.

## 2019-02-16 NOTE — ED Notes (Signed)
Ronalee Belts RN asked to attempt [place] IV as other infiltrated.

## 2019-02-16 NOTE — ED Provider Notes (Signed)
[facility]-EMERGENCY DEPT Provider Note   CSN: [id number] Arrival date & time: 02/16/19  1527     History   Chief Complaint Chief Complaint  Patient presents with  . Hyperglycemia  . Abdominal Pain  . Nausea    HPI [name] is a 28 y.o. female with history of type 1 diabetes mellitus, fibromyalgia, gastroparesis, GERD, hypertension, anxiety presents for evaluation of acute onset, persistent epigastric abdominal pain with associated nausea vomiting and diarrhea since yesterday.  Reports that her sugars usually run around the 1-2 100s but have been in the 300s since yesterday.  Has had multiple episodes of nonbloody nonbilious emesis and watery nonbloody diarrhea since yesterday.  Endorses constant burning epigastric abdominal pain which worsens "with doing anything".  Reports this is consistent with her usual gastroparesis flares.  Has taken Phenergan at home without relief of symptoms.  Has not been taking her insulin at home due to her symptoms.  Denies fevers, urinary symptoms, chest pain or shortness of breath.  She denies alcohol abuse.  Tells me that she smokes marijuana daily which "typically helps, but sometimes it doesn't".  She checked into [facility] ED earlier today but left prior to evaluation by ED provider per chart review.     The history is provided by the patient.    Past Medical History:  Diagnosis Date  . Allergy   . Anemia   . Anxiety   . Blood transfusion without reported diagnosis    Dec 2018  . Cataract    right eye  . Depression   . Diabetes type 1, uncontrolled (HCC) 11/14/2011   Since age 37  . Fibromyalgia   . Gastroparesis   . GERD (gastroesophageal reflux disease)   . Hypertension   . Infection    UTI April 2016    Patient Active Problem List   Diagnosis Date Noted  . Contraception management 08/06/2018  . Cyclical vomiting syndrome 06/07/2018  . Diabetic gastroparesis associated with type 1 diabetes mellitus  (HCC) 06/04/2018  . Anxiety 05/31/2018  . Peripheral edema 05/31/2018  . Normocytic anemia 07/10/2017  . GERD (gastroesophageal reflux disease) 06/23/2017  . 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 Diabetes mellitus type 1 (HCC) 04/28/2013  . Protein-calorie malnutrition, severe (HCC) 04/28/2013  . Hypertension associated with diabetes (HCC) 11/14/2011  . Chronic abdominal pain 09/18/2011    Past Surgical History:  Procedure Laterality Date  . ANKLE SURGERY    . CHOLECYSTECTOMY  11/15/2011   Procedure: LAPAROSCOPIC CHOLECYSTECTOMY WITH INTRAOPERATIVE CHOLANGIOGRAM;  Surgeon: [name], MD;  Location: [facility];  Service: General;  Laterality: N/A;  . COLONOSCOPY    . COLONOSCOPY WITH PROPOFOL N/A 06/27/2017   Procedure: COLONOSCOPY WITH PROPOFOL;  Surgeon: Rachael Fee, MD;  Location: [facility];  Service: Endoscopy;  Laterality: N/A;  . ESOPHAGOGASTRODUODENOSCOPY  12/03/2011   Procedure: ESOPHAGOGASTRODUODENOSCOPY (EGD);  Surgeon: Theda Belfast, MD;  Location: [facility];  Service: Endoscopy;  Laterality: N/A;  . FLEXIBLE SIGMOIDOSCOPY N/A 03/10/2017   Procedure: FLEXIBLE SIGMOIDOSCOPY;  Surgeon: Jeani Hawking, MD;  Location: [facility];  Service: Endoscopy;  Laterality: N/A;  . INCISION AND DRAINAGE PERIRECTAL ABSCESS N/A 03/01/2017   Procedure: IRRIGATION AND DEBRIDEMENT PERIRECTAL ABSCESS;  Surgeon: Ovidio Kin, MD;  Location: [facility];  Service: General;  Laterality: N/A;  . IRRIGATION AND DEBRIDEMENT BUTTOCKS N/A 03/23/2017   Procedure: IRRIGATION AND DEBRIDEMENT BUTTOCKS, SETON PLACEMENT;  Surgeon: Romie Levee, MD;  Location: [facility];  Service:  General;  Laterality: N/A;  . LAPAROSCOPY  11/23/2011   Procedure: LAPAROSCOPY DIAGNOSTIC;  Surgeon: Edward Jolly, MD;  Location: [facility];  Service: General;  Laterality: N/A;  . SIGMOIDOSCOPY    . UPPER GASTROINTESTINAL ENDOSCOPY    . WISDOM TOOTH EXTRACTION       OB History     Gravida  1   Para  1   Term      Preterm  1   AB      Living  1     SAB      TAB      Ectopic      Multiple  0   Live Births  1            Home Medications    Prior to Admission medications   Medication Sig Start Date End Date Taking? Authorizing Provider  cyclobenzaprine (FLEXERIL) 10 MG tablet TAKE 1 TABLET(10 MG) BY MOUTH THREE TIMES DAILY AS NEEDED FOR MUSCLE SPASMS Patient taking differently: Take 10 mg by mouth 3 (three) times daily as needed for muscle spasms.  02/10/19  Yes [name], MD  dicyclomine (BENTYL) 20 MG tablet Take 1 tablet (20 mg total) by mouth 4 (four) times daily -  before meals and at bedtime. 09/21/18  Yes Aline August, MD  furosemide (LASIX) 40 MG tablet TAKE 1 TABLET BY MOUTH  DAILY Patient taking differently: Take 40 mg by mouth daily.  11/11/18  Yes Armbruster, Carlota Raspberry, MD  hydrALAZINE (APRESOLINE) 50 MG tablet Take 1 tablet (50 mg total) by mouth every 6 (six) hours. 10/23/18  Yes Swayze, Ava, DO  Insulin Glargine (LANTUS SOLOSTAR) 100 UNIT/ML Solostar Pen Inject 10 Units into the skin at bedtime. And pen needles 4/day Patient taking differently: Inject 12 Units into the skin at bedtime. And pen needles 4/day 12/31/18  Yes Renato Shin, MD  insulin lispro (HUMALOG KWIKPEN) 100 UNIT/ML KwikPen Inject 0.06 mLs (6 Units total) into the skin 3 (three) times daily. Patient taking differently: Inject 5 Units into the skin 3 (three) times daily.  12/31/18  Yes Renato Shin, MD  LORazepam (ATIVAN) 0.5 MG tablet TAKE 1 TABLET BY MOUTH TWICE DAILY AS NEEDED FOR ANXIETY Patient taking differently: Take 0.5 mg by mouth 2 (two) times daily as needed for anxiety.  02/10/19  Yes [name], MD  metoCLOPramide (REGLAN) 10 MG tablet Take 1 tablet (10 mg total) by mouth every 6 (six) hours as needed for nausea or vomiting. 02/20/18  Yes Shelly Coss, MD  metoprolol tartrate (LOPRESSOR) 25 MG tablet Take 1 tablet (25 mg total) by mouth 2 (two)  times daily. 10/23/18  Yes Swayze, Ava, DO  mirtazapine (REMERON) 30 MG tablet TAKE 1 TABLET BY MOUTH AT  BEDTIME Patient taking differently: Take 30 mg by mouth at bedtime.  02/16/19  Yes [name], MD  ondansetron (ZOFRAN) 4 MG tablet Take 1 tablet (4 mg total) by mouth every 8 (eight) hours as needed for nausea or vomiting (If necessary, dont combine with Reglan.). 09/21/18  Yes Aline August, MD  pantoprazole (PROTONIX) 40 MG tablet TAKE 1 TABLET BY MOUTH  DAILY Patient taking differently: Take 40 mg by mouth daily.  10/22/18  Yes Armbruster, Carlota Raspberry, MD  potassium chloride 20 MEQ TBCR Take 20 mEq by mouth 2 (two) times daily. 10/17/18  Yes Isla Pence, MD  promethazine (PHENERGAN) 12.5 MG tablet TAKE 1 TABLET BY MOUTH  EVERY 8 HOURS AS NEEDED FOR NAUSEA AND  VOMITING Patient taking differently: Take 12.5 mg by mouth every 8 (eight) hours as needed for nausea or vomiting.  02/15/19  Yes Armbruster, Willaim Rayas, MD  promethazine (PHENERGAN) 25 MG suppository Place 1 suppository (25 mg total) rectally every 8 (eight) hours as needed for nausea or vomiting. 12/17/18  Yes Armbruster, Willaim Rayas, MD  traMADol (ULTRAM) 50 MG tablet TAKE 1 TABLET(50 MG) BY MOUTH EVERY 6 HOURS AS NEEDED FOR MODERATE PAIN OR SEVERE PAIN Patient taking differently: Take 50 mg by mouth every 6 (six) hours as needed for moderate pain or severe pain.  02/10/19  Yes Ardith Dark, MD  Continuous Blood Gluc Receiver (DEXCOM G6 RECEIVER) DEVI USE AS DIRECTED TO MONITOR GLUCOSE LEVELS 10/29/18   [provider]  Continuous Blood Gluc Sensor (DEXCOM G6 SENSOR) MISC 1 each by Does not apply route See admin instructions. every 10 days. 12/31/18   Romero Belling, MD  Continuous Blood Gluc Transmit (DEXCOM G6 TRANSMITTER) MISC USE AS DIRECTED TO MONITOR GLUCOSE LEVELS 10/29/18   [provider]    Family History Family History  Problem Relation Age of Onset  . Diabetes Mother   . Hypertension Father   . Colon  cancer Paternal Grandmother        pt thinks PGM was dx in her 70's  . Diabetes Paternal Grandmother   . Diabetes Maternal Grandmother   . Diabetes Maternal Grandfather   . Diabetes Paternal Grandfather   . Diabetes Other   . Esophageal cancer Neg Hx   . Liver cancer Neg Hx   . Pancreatic cancer Neg Hx   . Stomach cancer Neg Hx   . Rectal cancer Neg Hx     Social History Social History   Tobacco Use  . Smoking status: Never Smoker  . Smokeless tobacco: Never Used  Substance Use Topics  . Alcohol use: No    Alcohol/week: 0.0 standard drinks    Frequency: Never  . Drug use: No     Allergies   Other and Lactose intolerance (gi)   Review of Systems Review of Systems  Constitutional: Negative for chills and fever.  Respiratory: Negative for shortness of breath.   Cardiovascular: Negative for chest pain.  Gastrointestinal: Positive for abdominal pain, diarrhea, nausea and vomiting.  All other systems reviewed and are negative.    Physical Exam Updated Vital Signs BP 123/80   Pulse (!) 106   Temp (!) 97.5 F (36.4 C) (Oral)   Resp 11   SpO2 99%   Physical Exam Vitals signs and nursing note reviewed.  Constitutional:      General: She is not in acute distress.    Appearance: She is well-developed.     Comments: Appears uncomfortable, frequently references her pain  HENT:     Head: Normocephalic and atraumatic.  Eyes:     General:        Right eye: No discharge.        Left eye: No discharge.     Conjunctiva/sclera: Conjunctivae normal.  Neck:     Vascular: No JVD.     Trachea: No tracheal deviation.  Cardiovascular:     Rate and Rhythm: Regular rhythm. Tachycardia present.  Pulmonary:     Effort: Pulmonary effort is normal.     Breath sounds: Wheezing present.     Comments: Soft expiratory wheezes, speaking in full sentences without difficulty, SPO2 saturations 100% on room air Abdominal:     General: Abdomen is flat. There is no distension.  Palpations: Abdomen is soft.     Tenderness: There is abdominal tenderness in the epigastric area. There is no right CVA tenderness, left CVA tenderness, guarding or rebound.  Skin:    General: Skin is warm and dry.     Findings: No erythema.  Neurological:     Mental Status: She is alert.  Psychiatric:        Behavior: Behavior normal.     ED Treatments / Results  Labs (all labs ordered are listed, but only abnormal results are displayed) Labs Reviewed  CBC WITH DIFFERENTIAL/PLATELET - Abnormal; Notable for the following components:      Result Value   Hemoglobin 11.2 (*)    HCT 34.7 (*)    All other components within normal limits  COMPREHENSIVE METABOLIC PANEL - Abnormal; Notable for the following components:   Glucose, Bld 369 (*)    BUN 41 (*)    Creatinine, Ser 1.16 (*)    Total Protein 9.1 (*)    ALT 52 (*)    Anion gap 17 (*)    All other components within normal limits  URINALYSIS, ROUTINE W REFLEX MICROSCOPIC - Abnormal; Notable for the following components:   Glucose, UA >=500 (*)    Hgb urine dipstick MODERATE (*)    Ketones, ur 5 (*)    Protein, ur >=300 (*)    Bacteria, UA RARE (*)    All other components within normal limits  CBG MONITORING, ED - Abnormal; Notable for the following components:   Glucose-Capillary 346 (*)    All other components within normal limits  CBG MONITORING, ED - Abnormal; Notable for the following components:   Glucose-Capillary 325 (*)    All other components within normal limits  LIPASE, BLOOD  BLOOD GAS, VENOUS  I-STAT BETA HCG BLOOD, ED ([facility], [facility], [facility] ONLY)    EKG None  Radiology No results found.  Procedures Procedures (including critical care time)  Medications Ordered in ED Medications  alum & mag hydroxide-simeth (MAALOX/MYLANTA) 200-200-20 MG/5ML suspension 30 mL (has no administration in time range)    And  lidocaine (XYLOCAINE) 2 % viscous mouth solution 15 mL (has no administration in time range)  sodium  chloride 0.9 % bolus 1,000 mL (0 mLs Intravenous Stopped 02/16/19 1839)  HYDROmorphone (DILAUDID) injection 0.5 mg (0.5 mg Intravenous Given 02/16/19 1656)  haloperidol lactate (HALDOL) injection 2.5 mg (2.5 mg Intravenous Given 02/16/19 1740)  sodium chloride 0.9 % bolus 1,000 mL (0 mLs Intravenous Stopped 02/16/19 2131)     Initial Impression / Assessment and Plan / ED Course  I have reviewed the triage vital signs and the nursing notes.  Pertinent labs & imaging results that were available during my care of the patient were reviewed by me and considered in my medical decision making (see chart for details).         Patient returns to the ED for evaluation of epigastric abdominal pain with associated nausea vomiting diarrhea.  She is afebrile, persistently tachycardic in the ED.  Seen frequently for similar symptoms.  Known to be noncompliant with her diabetes medications and has a history of diabetic gastroparesis.  Also smokes marijuana daily.  EKG shows sinus tachycardia with normal QTC.  Will give IV fluids, pain medicine, Haldol for nausea/vomiting.  No peritoneal signs on examination of the abdomen, doubt acute surgical abdominal pathology.  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  Do not feel she requires emergent imaging at  this time.  However I do feel that she does require blood work to rule out DKA as she is hyperglycemic in the ED.  Lab reviewed by me shows no leukocytosis, stable anemia.   Creatinine and BUN further elevated compared to bloodwork that was drawn at midnight today, likely reflecting dehydration in the setting of persistent vomiting.  Her anion gap is elevated however bicarb is within normal limits and VBG shows normal blood pH so she is likely not in DKA.  Lipase within normal limits.  She has mild ketonuria, did not have any ketones in her urine earlier today.  She received 2 L IV  fluids with improvement in her blood sugars.   8:47PM Patient's RN informed me that she left AMA as her significant other came to pick her up.  Per nursing the patient had been able to tolerate p.o. fluids.  Was unable to reassess the patient prior to her eloping from the ED.   Final Clinical Impressions(s) / ED Diagnoses   Final diagnoses:  Epigastric abdominal pain  Nausea vomiting and diarrhea  Hyperglycemia    ED Discharge Orders    None       Jeanie SewerFawze, Lewis Grivas A, PA-C 02/17/19 0003    Milagros Lollykstra, Richard S, MD 02/17/19 1510

## 2019-02-16 NOTE — ED Triage Notes (Addendum)
Patient reports having high blood sugar.   C/o abdominal pain and nausea    A/ox4 Ambulatory in triage.   [id number]

## 2019-02-17 ENCOUNTER — Other Ambulatory Visit: Payer: Self-pay

## 2019-02-17 ENCOUNTER — Emergency Department (HOSPITAL_COMMUNITY)
Admission: EM | Admit: 2019-02-17 | Discharge: 2019-02-18 | Disposition: A | Discharge status: Home / Self Care | Payer: 59 | Attending: Emergency Medicine | Admitting: Emergency Medicine

## 2019-02-17 ENCOUNTER — Encounter (HOSPITAL_COMMUNITY): Payer: Self-pay | Admitting: Emergency Medicine

## 2019-02-17 DIAGNOSIS — I10 Essential (primary) hypertension: Secondary | ICD-10-CM | POA: Insufficient documentation

## 2019-02-17 DIAGNOSIS — R1013 Epigastric pain: Secondary | ICD-10-CM | POA: Insufficient documentation

## 2019-02-17 DIAGNOSIS — E1043 Type 1 diabetes mellitus with diabetic autonomic (poly)neuropathy: Secondary | ICD-10-CM | POA: Insufficient documentation

## 2019-02-17 DIAGNOSIS — R112 Nausea with vomiting, unspecified: Secondary | ICD-10-CM

## 2019-02-17 DIAGNOSIS — Z794 Long term (current) use of insulin: Secondary | ICD-10-CM | POA: Insufficient documentation

## 2019-02-17 DIAGNOSIS — Z79899 Other long term (current) drug therapy: Secondary | ICD-10-CM | POA: Diagnosis not present

## 2019-02-17 DIAGNOSIS — R109 Unspecified abdominal pain: Secondary | ICD-10-CM | POA: Diagnosis present

## 2019-02-17 DIAGNOSIS — K3184 Gastroparesis: Secondary | ICD-10-CM | POA: Insufficient documentation

## 2019-02-17 LAB — CBC
HCT: 33.7 % — ABNORMAL LOW (ref 36.0–46.0)
Hemoglobin: 11.1 g/dL — ABNORMAL LOW (ref 12.0–15.0)
MCH: 28.7 pg (ref 26.0–34.0)
MCHC: 32.9 g/dL (ref 30.0–36.0)
MCV: 87.1 fL (ref 80.0–100.0)
Platelets: 227 10*3/uL (ref 150–400)
RBC: 3.87 MIL/uL (ref 3.87–5.11)
RDW: 11.9 % (ref 11.5–15.5)
WBC: 7.2 10*3/uL (ref 4.0–10.5)
nRBC: 0 % (ref 0.0–0.2)

## 2019-02-17 LAB — I-STAT BETA HCG BLOOD, ED (MC, WL, AP ONLY): I-stat hCG, quantitative: <5 m[IU]/mL (ref <5)

## 2019-02-17 NOTE — ED Provider Notes (Signed)
[facility] DEPT Provider Note   CSN: [id number] Arrival date & time: 02/17/19  2121     History   Chief Complaint Chief Complaint  Patient presents with   Abdominal Pain   Emesis    HPI [name] is a 28 y.o. female.     Patient returns to the emergency department for the 3rd time in 3 days for chronic/recurrent epigastric abdominal pain, nausea, vomiting and diarrhea. No fever, cough, chest pain. She continues to make urine despite being unable to tolerate fluids and denies dysuria or hematuria. History of T1DM, gastroparesis, GERD, HTN, chronic abdominal pain. She has not been able to control symptoms with usual medications.   The history is provided by the patient. No language interpreter was used.  Abdominal Pain Associated symptoms: vomiting   Associated symptoms: no chest pain, no chills, no cough, no dysuria, no fever and no shortness of breath   Emesis Associated symptoms: abdominal pain   Associated symptoms: no chills, no cough and no fever     Past Medical History:  Diagnosis Date   Allergy    Anemia    Anxiety    Blood transfusion without reported diagnosis    Dec 2018   Cataract    right eye   Depression    Diabetes type 1, uncontrolled ([place]) 11/14/2011   Since age 44   Fibromyalgia    Gastroparesis    GERD (gastroesophageal reflux disease)    Hypertension    Infection    UTI April 2016    Patient Active Problem List   Diagnosis Date Noted   Contraception management 37/12/6267   Cyclical vomiting syndrome 06/07/2018   Diabetic gastroparesis associated with type 1 diabetes mellitus ([place]) 06/04/2018   Anxiety 05/31/2018   Peripheral edema 05/31/2018   Normocytic anemia 07/10/2017   GERD (gastroesophageal reflux disease) 06/23/2017   MDD (major depressive disorder), recurrent episode, mild ([place]) 02/28/2017   Diabetes mellitus type 1 ([place]) 04/28/2013   Protein-calorie malnutrition,  severe ([place]) 04/28/2013   Hypertension associated with diabetes ([place]) 11/14/2011   Chronic abdominal pain 09/18/2011    Past Surgical History:  Procedure Laterality Date   ANKLE SURGERY     CHOLECYSTECTOMY  11/15/2011   Procedure: LAPAROSCOPIC CHOLECYSTECTOMY WITH INTRAOPERATIVE CHOLANGIOGRAM;  Surgeon: [name], MD;  [facility]: [facility];  Service: General;  Laterality: N/A;   COLONOSCOPY     COLONOSCOPY WITH PROPOFOL N/A 06/27/2017   Procedure: COLONOSCOPY WITH PROPOFOL;  Surgeon: Milus Banister, MD;  [facility];  Service: Endoscopy;  Laterality: N/A;   ESOPHAGOGASTRODUODENOSCOPY  12/03/2011   Procedure: ESOPHAGOGASTRODUODENOSCOPY (EGD);  Surgeon: Beryle Beams, MD;  Location: [facility];  Service: Endoscopy;  Laterality: N/A;   FLEXIBLE SIGMOIDOSCOPY N/A 03/10/2017   Procedure: FLEXIBLE SIGMOIDOSCOPY;  Surgeon: Carol Ada, MD;  [facility];  Service: Endoscopy;  Laterality: N/A;   INCISION AND DRAINAGE PERIRECTAL ABSCESS N/A 03/01/2017   Procedure: IRRIGATION AND DEBRIDEMENT PERIRECTAL ABSCESS;  Surgeon: Alphonsa Overall, MD;  [facility];  Service: General;  Laterality: N/A;   IRRIGATION AND DEBRIDEMENT BUTTOCKS N/A 03/23/2017   Procedure: IRRIGATION AND DEBRIDEMENT BUTTOCKS, SETON PLACEMENT;  Surgeon: [name], MD;  [facility];  Service: General;  Laterality: N/A;   LAPAROSCOPY  11/23/2011   Procedure: LAPAROSCOPY DIAGNOSTIC;  Surgeon: Edward Jolly, MD;  [facility];  Service: General;  Laterality: N/A;   SIGMOIDOSCOPY     UPPER GASTROINTESTINAL ENDOSCOPY     WISDOM TOOTH EXTRACTION  OB History    Gravida  1   Para  1   Term      Preterm  1   AB      Living  1     SAB      TAB      Ectopic      Multiple  0   Live Births  1            Home Medications    Prior to Admission medications   Medication Sig Start Date End Date Taking? Authorizing Provider  Continuous Blood Gluc  Receiver (DEXCOM G6 RECEIVER) DEVI USE AS DIRECTED TO MONITOR GLUCOSE LEVELS 10/29/18   [provider]  Continuous Blood Gluc Sensor (DEXCOM G6 SENSOR) MISC 1 each by Does not apply route See admin instructions. every 10 days. 12/31/18   Renato Shin, MD  Continuous Blood Gluc Transmit (DEXCOM G6 TRANSMITTER) MISC USE AS DIRECTED TO MONITOR GLUCOSE LEVELS 10/29/18   [provider]  cyclobenzaprine (FLEXERIL) 10 MG tablet TAKE 1 TABLET(10 MG) BY MOUTH THREE TIMES DAILY AS NEEDED FOR MUSCLE SPASMS Patient taking differently: Take 10 mg by mouth 3 (three) times daily as needed for muscle spasms.  02/10/19   [name], MD  dicyclomine (BENTYL) 20 MG tablet Take 1 tablet (20 mg total) by mouth 4 (four) times daily -  before meals and at bedtime. 09/21/18   Aline August, MD  furosemide (LASIX) 40 MG tablet TAKE 1 TABLET BY MOUTH  DAILY Patient taking differently: Take 40 mg by mouth daily.  11/11/18   Armbruster, Carlota Raspberry, MD  hydrALAZINE (APRESOLINE) 50 MG tablet Take 1 tablet (50 mg total) by mouth every 6 (six) hours. 10/23/18   Swayze, Ava, DO  Insulin Glargine (LANTUS SOLOSTAR) 100 UNIT/ML Solostar Pen Inject 10 Units into the skin at bedtime. And pen needles 4/day Patient taking differently: Inject 12 Units into the skin at bedtime. And pen needles 4/day 12/31/18   Renato Shin, MD  insulin lispro (HUMALOG KWIKPEN) 100 UNIT/ML KwikPen Inject 0.06 mLs (6 Units total) into the skin 3 (three) times daily. Patient taking differently: Inject 5 Units into the skin 3 (three) times daily.  12/31/18   Renato Shin, MD  LORazepam (ATIVAN) 0.5 MG tablet TAKE 1 TABLET BY MOUTH TWICE DAILY AS NEEDED FOR ANXIETY Patient taking differently: Take 0.5 mg by mouth 2 (two) times daily as needed for anxiety.  02/10/19   [name], MD  metoCLOPramide (REGLAN) 10 MG tablet Take 1 tablet (10 mg total) by mouth every 6 (six) hours as needed for nausea or vomiting. 02/20/18   Shelly Coss, MD    metoprolol tartrate (LOPRESSOR) 25 MG tablet Take 1 tablet (25 mg total) by mouth 2 (two) times daily. 10/23/18   Swayze, Ava, DO  mirtazapine (REMERON) 30 MG tablet TAKE 1 TABLET BY MOUTH AT  BEDTIME Patient taking differently: Take 30 mg by mouth at bedtime.  02/16/19   [name], MD  ondansetron (ZOFRAN) 4 MG tablet Take 1 tablet (4 mg total) by mouth every 8 (eight) hours as needed for nausea or vomiting (If necessary, dont combine with Reglan.). 09/21/18   Aline August, MD  pantoprazole (PROTONIX) 40 MG tablet TAKE 1 TABLET BY MOUTH  DAILY Patient taking differently: Take 40 mg by mouth daily.  10/22/18   Armbruster, Carlota Raspberry, MD  potassium chloride 20 MEQ TBCR Take 20 mEq by mouth 2 (two) times daily. 10/17/18   Isla Pence, MD  promethazine (PHENERGAN) 12.5 MG tablet TAKE 1 TABLET BY MOUTH  EVERY 8 HOURS AS NEEDED FOR NAUSEA AND VOMITING Patient taking differently: Take 12.5 mg by mouth every 8 (eight) hours as needed for nausea or vomiting.  02/15/19   Armbruster, Carlota Raspberry, MD  promethazine (PHENERGAN) 25 MG suppository Place 1 suppository (25 mg total) rectally every 8 (eight) hours as needed for nausea or vomiting. 12/17/18   Armbruster, Carlota Raspberry, MD  traMADol (ULTRAM) 50 MG tablet TAKE 1 TABLET(50 MG) BY MOUTH EVERY 6 HOURS AS NEEDED FOR MODERATE PAIN OR SEVERE PAIN Patient taking differently: Take 50 mg by mouth every 6 (six) hours as needed for moderate pain or severe pain.  02/10/19   [name], MD    Family History Family History  Problem Relation Age of Onset   Diabetes Mother    Hypertension Father    Colon cancer Paternal Grandmother        pt thinks PGM was dx in her 105's   Diabetes Paternal Grandmother    Diabetes Maternal Grandmother    Diabetes Maternal Grandfather    Diabetes Paternal Grandfather    Diabetes Other    Esophageal cancer Neg Hx    Liver cancer Neg Hx    Pancreatic cancer Neg Hx    Stomach cancer Neg Hx    Rectal cancer Neg  Hx     Social History Social History   Tobacco Use   Smoking status: Never Smoker   Smokeless tobacco: Never Used  Substance Use Topics   Alcohol use: No    Alcohol/week: 0.0 standard drinks    Frequency: Never   Drug use: No     Allergies   Other and Lactose intolerance (gi)   Review of Systems Review of Systems  Constitutional: Negative for chills and fever.  HENT: Negative.   Respiratory: Negative.  Negative for cough and shortness of breath.   Cardiovascular: Negative.  Negative for chest pain.  Gastrointestinal: Positive for abdominal pain and vomiting.  Genitourinary: Negative for decreased urine volume and dysuria.  Musculoskeletal: Negative.   Skin: Negative.   Neurological: Negative.  Negative for syncope.     Physical Exam Updated Vital Signs BP (!) 125/95 (BP Location: Left Arm)    Pulse (!) 113    Temp (!) 97.5 F (36.4 C)    Resp (!) 21    SpO2 100%   Physical Exam Vitals signs and nursing note reviewed.  Constitutional:      General: She is not in acute distress.    Appearance: She is well-developed.     Comments: Uncomfortable appearing.   HENT:     Head: Normocephalic.  Neck:     Musculoskeletal: Normal range of motion and neck supple.  Cardiovascular:     Rate and Rhythm: Regular rhythm. Tachycardia present.     Heart sounds: No murmur.  Pulmonary:     Effort: Pulmonary effort is normal.     Breath sounds: Normal breath sounds.  Abdominal:     General: Bowel sounds are normal.     Palpations: Abdomen is soft.     Tenderness: There is abdominal tenderness in the epigastric area. There is guarding. There is no rebound.  Musculoskeletal: Normal range of motion.  Skin:    General: Skin is warm and dry.     Findings: No rash.  Neurological:     Mental Status: She is alert.     Cranial Nerves: No cranial nerve deficit.  ED Treatments / Results  Labs (all labs ordered are listed, but only abnormal results are displayed) Labs  Reviewed  CBC - Abnormal; Notable for the following components:      Result Value   Hemoglobin 11.1 (*)    HCT 33.7 (*)    All other components within normal limits  COMPREHENSIVE METABOLIC PANEL  URINALYSIS, ROUTINE W REFLEX MICROSCOPIC  I-STAT BETA HCG BLOOD, ED ([facility], [facility], [facility] ONLY)    EKG None  Radiology No results found.  Procedures Procedures (including critical care time)  Medications Ordered in ED Medications - No data to display   Initial Impression / Assessment and Plan / ED Course  I have reviewed the triage vital signs and the nursing notes.  Pertinent labs & imaging results that were available during my care of the patient were reviewed by me and considered in my medical decision making (see chart for details).        Patient returns to ED with persistent N, V, abdominal pain. She was seen yesterday for same, left AMA because her ride came to the hospital, but states she felt no better at discharge at that time. She did not attempt to contact her doctor today for further care.   Chart reviewed. Last note from GI 09/20/18 office visit, confirms diagnosis of gastroparesis, r/o colitis by random biopsies obtained during colonoscopy. Patient repeatedly asking for Dilaudid and Ativan - recommended limiting narcotics, continue Ativan for nausea.   2:10 - She has been given Levsin IV, Ativan, Reglan and benadryl but states her pain is no better. Will given Fentanyl and recheck. No vomiting.   Patient is feeling much better on recheck. No further vomiting. She is tolerating PO fluids. She states she is ready for discharge home.   Final Clinical Impressions(s) / ED Diagnoses   Final diagnoses:  None   1. Abdominal pain 2. Nausea and vomiting 3. Gastroparesis  ED Discharge Orders    None       Charlann Lange, PA-C 02/18/19 [place], April, MD 02/18/19 [phone]

## 2019-02-17 NOTE — ED Triage Notes (Signed)
Patient c/o abdominal pain with N/V today. Seen yesterday for same.

## 2019-02-17 NOTE — ED Notes (Signed)
Pt asked to go to BR, pt ambulated to BR and attempted to get urine specimen.

## 2019-02-18 ENCOUNTER — Emergency Department (HOSPITAL_COMMUNITY)
Admission: EM | Admit: 2019-02-18 | Discharge: 2019-02-19 | Disposition: A | Discharge status: Home / Self Care | Payer: 59 | Source: Home / Self Care | Attending: Emergency Medicine | Admitting: Emergency Medicine

## 2019-02-18 ENCOUNTER — Other Ambulatory Visit: Payer: Self-pay

## 2019-02-18 ENCOUNTER — Encounter (HOSPITAL_COMMUNITY): Payer: Self-pay

## 2019-02-18 DIAGNOSIS — R112 Nausea with vomiting, unspecified: Secondary | ICD-10-CM

## 2019-02-18 DIAGNOSIS — I10 Essential (primary) hypertension: Secondary | ICD-10-CM | POA: Insufficient documentation

## 2019-02-18 DIAGNOSIS — R1084 Generalized abdominal pain: Secondary | ICD-10-CM | POA: Insufficient documentation

## 2019-02-18 DIAGNOSIS — E1043 Type 1 diabetes mellitus with diabetic autonomic (poly)neuropathy: Secondary | ICD-10-CM | POA: Insufficient documentation

## 2019-02-18 LAB — COMPREHENSIVE METABOLIC PANEL
ALT: 33 U/L (ref 0–44)
ALT: 31 U/L (ref 0–44)
AST: 19 U/L (ref 15–41)
AST: 19 U/L (ref 15–41)
Albumin: 3.7 g/dL (ref 3.5–5.0)
Albumin: 4 g/dL (ref 3.5–5.0)
Alkaline Phosphatase: 92 U/L (ref 38–126)
Alkaline Phosphatase: 92 U/L (ref 38–126)
Anion gap: 13 (ref 5–15)
Anion gap: 14 (ref 5–15)
BUN: 41 mg/dL — ABNORMAL HIGH (ref 6–20)
BUN: 43 mg/dL — ABNORMAL HIGH (ref 6–20)
CO2: 31 mmol/L (ref 22–32)
CO2: 33 mmol/L — ABNORMAL HIGH (ref 22–32)
Calcium: 9.3 mg/dL (ref 8.9–10.3)
Calcium: 9 mg/dL (ref 8.9–10.3)
Chloride: 98 mmol/L (ref 98–111)
Chloride: 96 mmol/L — ABNORMAL LOW (ref 98–111)
Creatinine, Ser: 1.09 mg/dL — ABNORMAL HIGH (ref 0.44–1.00)
Creatinine, Ser: 1 mg/dL (ref 0.44–1.00)
GFR calc Af Amer: >60 mL/min (ref >60)
GFR calc Af Amer: >60 mL/min (ref >60)
GFR calc non Af Amer: >60 mL/min (ref >60)
GFR calc non Af Amer: >60 mL/min (ref >60)
Glucose, Bld: 175 mg/dL — ABNORMAL HIGH (ref 70–99)
Glucose, Bld: 235 mg/dL — ABNORMAL HIGH (ref 70–99)
Potassium: 3.4 mmol/L — ABNORMAL LOW (ref 3.5–5.1)
Potassium: 3.3 mmol/L — ABNORMAL LOW (ref 3.5–5.1)
Sodium: 142 mmol/L (ref 135–145)
Sodium: 143 mmol/L (ref 135–145)
Total Bilirubin: 0.5 mg/dL (ref 0.3–1.2)
Total Bilirubin: 1 mg/dL (ref 0.3–1.2)
Total Protein: 7.6 g/dL (ref 6.5–8.1)
Total Protein: 7.8 g/dL (ref 6.5–8.1)

## 2019-02-18 LAB — URINALYSIS, ROUTINE W REFLEX MICROSCOPIC
Bilirubin Urine: NEGATIVE
Bilirubin Urine: NEGATIVE
Glucose, UA: >=500 mg/dL — AB
Glucose, UA: 150 mg/dL — AB
Ketones, ur: NEGATIVE mg/dL
Ketones, ur: 5 mg/dL — AB
Leukocytes,Ua: NEGATIVE
Leukocytes,Ua: NEGATIVE
Nitrite: NEGATIVE
Nitrite: NEGATIVE
Protein, ur: >=300 mg/dL — AB
Protein, ur: >=300 mg/dL — AB
Specific Gravity, Urine: 1.014 (ref 1.005–1.030)
Specific Gravity, Urine: 1.017 (ref 1.005–1.030)
pH: 5 (ref 5.0–8.0)
pH: 5 (ref 5.0–8.0)

## 2019-02-18 LAB — CBC
HCT: 33.9 % — ABNORMAL LOW (ref 36.0–46.0)
Hemoglobin: 11 g/dL — ABNORMAL LOW (ref 12.0–15.0)
MCH: 28.8 pg (ref 26.0–34.0)
MCHC: 32.4 g/dL (ref 30.0–36.0)
MCV: 88.7 fL (ref 80.0–100.0)
Platelets: 225 10*3/uL (ref 150–400)
RBC: 3.82 MIL/uL — ABNORMAL LOW (ref 3.87–5.11)
RDW: 11.9 % (ref 11.5–15.5)
WBC: 6.5 10*3/uL (ref 4.0–10.5)
nRBC: 0 % (ref 0.0–0.2)

## 2019-02-18 LAB — I-STAT BETA HCG BLOOD, ED (MC, WL, AP ONLY): I-stat hCG, quantitative: <5 m[IU]/mL (ref <5)

## 2019-02-18 LAB — BLOOD GAS, VENOUS
Acid-Base Excess: 10.6 mmol/L — ABNORMAL HIGH (ref 0.0–2.0)
Acid-Base Excess: 8.4 mmol/L — ABNORMAL HIGH (ref 0.0–2.0)
Bicarbonate: 34.7 mmol/L — ABNORMAL HIGH (ref 20.0–28.0)
Bicarbonate: 34.8 mmol/L — ABNORMAL HIGH (ref 20.0–28.0)
O2 Saturation: 45.3 %
O2 Saturation: 83.8 %
Patient temperature: 98.6
Patient temperature: 98.6
pCO2, Ven: 44.1 mmHg (ref 44.0–60.0)
pCO2, Ven: 54.7 mmHg (ref 44.0–60.0)
pH, Ven: 7.507 — ABNORMAL HIGH (ref 7.250–7.430)
pH, Ven: 7.42 (ref 7.250–7.430)
pO2, Ven: 52.9 mmHg — ABNORMAL HIGH (ref 32.0–45.0)

## 2019-02-18 LAB — CBG MONITORING, ED: Glucose-Capillary: 208 mg/dL — ABNORMAL HIGH (ref 70–99)

## 2019-02-18 LAB — LIPASE, BLOOD: Lipase: 20 U/L (ref 11–51)

## 2019-02-18 MED ORDER — FENTANYL CITRATE (PF) 100 MCG/2ML IJ SOLN
50.0000 ug | Freq: Once | INTRAMUSCULAR | Status: AC
  Administered 2019-02-18: 50 ug via INTRAVENOUS
  Filled 2019-02-18: qty 2

## 2019-02-18 MED ORDER — DICYCLOMINE HCL 20 MG PO TABS: 20.0000 mg | ORAL_TABLET | Freq: Three times a day (TID) | ORAL | 0 refills | Status: DC | PRN

## 2019-02-18 MED ORDER — SODIUM CHLORIDE 0.9% FLUSH
3.0000 mL | Freq: Once | INTRAVENOUS | Status: AC
  Administered 2019-02-18: 3 mL via INTRAVENOUS

## 2019-02-18 MED ORDER — FENTANYL CITRATE (PF) 100 MCG/2ML IJ SOLN
100.0000 ug | Freq: Once | INTRAMUSCULAR | Status: AC
  Administered 2019-02-18: 100 ug via INTRAVENOUS
  Filled 2019-02-18: qty 2

## 2019-02-18 MED ORDER — DIPHENHYDRAMINE HCL 50 MG/ML IJ SOLN
12.5000 mg | Freq: Once | INTRAMUSCULAR | Status: AC
  Administered 2019-02-18: 12.5 mg via INTRAVENOUS
  Filled 2019-02-18: qty 1

## 2019-02-18 MED ORDER — SODIUM CHLORIDE 0.9 % IV BOLUS
1000.0000 mL | Freq: Once | INTRAVENOUS | Status: AC
  Administered 2019-02-18: 1000 mL via INTRAVENOUS

## 2019-02-18 MED ORDER — PANTOPRAZOLE SODIUM 40 MG IV SOLR
40.0000 mg | Freq: Once | INTRAVENOUS | Status: AC
  Administered 2019-02-18: 40 mg via INTRAVENOUS
  Filled 2019-02-18: qty 40

## 2019-02-18 MED ORDER — HALOPERIDOL LACTATE 5 MG/ML IJ SOLN
2.0000 mg | Freq: Once | INTRAMUSCULAR | Status: AC
  Administered 2019-02-18: 2 mg via INTRAVENOUS
  Filled 2019-02-18: qty 1

## 2019-02-18 MED ORDER — METOCLOPRAMIDE HCL 5 MG/ML IJ SOLN
10.0000 mg | Freq: Once | INTRAMUSCULAR | Status: AC
  Administered 2019-02-18: 10 mg via INTRAVENOUS
  Filled 2019-02-18: qty 2

## 2019-02-18 MED ORDER — HYOSCYAMINE SULFATE 0.5 MG/ML IJ SOLN
0.2500 mg | Freq: Once | INTRAMUSCULAR | Status: AC
  Administered 2019-02-18: 0.25 mg via INTRAVENOUS
  Filled 2019-02-18: qty 0.5

## 2019-02-18 NOTE — ED Notes (Signed)
URINE COLLECTED IN TRIAGE 

## 2019-02-18 NOTE — Discharge Instructions (Signed)
Please call your gastroenterologist on Monday. I have refilled your Bentyl. Return to the ED with any new or worsening symptoms.

## 2019-02-18 NOTE — ED Triage Notes (Signed)
Patient c/o N/V and abdominal pain.   Patient seen here the past 3 days for same.   A/O per baseline.   Hx. gastroparesis and DM   [id number]

## 2019-02-18 NOTE — ED Notes (Signed)
OBSERVED NO EMESIS. PT FILLING CLEAR LIQUID INTO BOTTLE AND PUTTING ICE FROM ICE PACK. OBSERVED TWICE. UNSURE OF HOW MUCH PT HAS INGESTED. GAIT STEADY TO BATHROOM. REQUESTED URINE. PT UNABLE TO OBTAIN

## 2019-02-18 NOTE — Discharge Instructions (Signed)
Continue your regular medications at home and call your doctor (Dr. Ardis Hughs) to schedule an appointment for recheck of your symptoms of gastroparesis.

## 2019-02-18 NOTE — ED Notes (Signed)
OBSERVED DRINKING BOTTLE WATER

## 2019-02-18 NOTE — ED Provider Notes (Signed)
Emergency Department Provider Note   I have reviewed the triage vital signs and the nursing notes.   HISTORY  Chief Complaint Abdominal Pain and Nausea   HPI [name] is a 28 y.o. female with insulin-dependent diabetes and chronic abdominal pain with nausea and vomiting returns to the emergency department with abdominal pain and vomiting today.  She was seen in the emergency department yesterday evening with similar.  After treatment she felt somewhat better and returned home but states that since returning home she has had return of vomiting and abdominal pain.  She does have history of gastroparesis.  She has been trying her Phenergan at home with no relief.  No fevers or chills.  No chest pain or shortness of breath.  States that her blood sugars have been running in the 200s at home. No radiation of symptoms or modifying factors.   Past Medical History:  Diagnosis Date  . Allergy   . Anemia   . Anxiety   . Blood transfusion without reported diagnosis    Dec 2018  . Cataract    right eye  . Depression   . Diabetes type 1, uncontrolled ([place]) 11/14/2011   Since age 62  . Fibromyalgia   . Gastroparesis   . GERD (gastroesophageal reflux disease)   . Hypertension   . Infection    UTI April 2016    Patient Active Problem List   Diagnosis Date Noted  . Contraception management 08/06/2018  . Cyclical vomiting syndrome 06/07/2018  . Diabetic gastroparesis associated with type 1 diabetes mellitus ([place]) 06/04/2018  . Anxiety 05/31/2018  . Peripheral edema 05/31/2018  . Normocytic anemia 07/10/2017  . GERD (gastroesophageal reflux disease) 06/23/2017  . MDD (major depressive disorder), recurrent episode, mild ([place]) 02/28/2017  . Diabetes mellitus type 1 ([place]) 04/28/2013  . Protein-calorie malnutrition, severe ([place]) 04/28/2013  . Hypertension associated with diabetes ([place]) 11/14/2011  . Chronic abdominal pain 09/18/2011    Past Surgical History:  Procedure  Laterality Date  . ANKLE SURGERY    . CHOLECYSTECTOMY  11/15/2011   Procedure: LAPAROSCOPIC CHOLECYSTECTOMY WITH INTRAOPERATIVE CHOLANGIOGRAM;  Surgeon: [name], MD;  [facility];  Service: General;  Laterality: N/A;  . COLONOSCOPY    . COLONOSCOPY WITH PROPOFOL N/A 06/27/2017   Procedure: COLONOSCOPY WITH PROPOFOL;  Surgeon: Milus Banister, MD;  [facility];  Service: Endoscopy;  Laterality: N/A;  . ESOPHAGOGASTRODUODENOSCOPY  12/03/2011   Procedure: ESOPHAGOGASTRODUODENOSCOPY (EGD);  Surgeon: Beryle Beams, MD;  Location: [facility];  Service: Endoscopy;  Laterality: N/A;  . FLEXIBLE SIGMOIDOSCOPY N/A 03/10/2017   Procedure: FLEXIBLE SIGMOIDOSCOPY;  Surgeon: Carol Ada, MD;  [facility];  Service: Endoscopy;  Laterality: N/A;  . INCISION AND DRAINAGE PERIRECTAL ABSCESS N/A 03/01/2017   Procedure: IRRIGATION AND DEBRIDEMENT PERIRECTAL ABSCESS;  Surgeon: Alphonsa Overall, MD;  [facility];  Service: General;  Laterality: N/A;  . IRRIGATION AND DEBRIDEMENT BUTTOCKS N/A 03/23/2017   Procedure: IRRIGATION AND DEBRIDEMENT BUTTOCKS, SETON PLACEMENT;  Surgeon: [name], MD;  [facility];  Service: General;  Laterality: N/A;  . LAPAROSCOPY  11/23/2011   Procedure: LAPAROSCOPY DIAGNOSTIC;  Surgeon: Edward Jolly, MD;  [facility];  Service: General;  Laterality: N/A;  . SIGMOIDOSCOPY    . UPPER GASTROINTESTINAL ENDOSCOPY    . WISDOM TOOTH EXTRACTION      Allergies Other and Lactose intolerance (gi)  Family History  Problem Relation Age of Onset  . Diabetes Mother   . Hypertension Father   .  Colon cancer Paternal Grandmother        pt thinks PGM was dx in her 48's  . Diabetes Paternal Grandmother   . Diabetes Maternal Grandmother   . Diabetes Maternal Grandfather   . Diabetes Paternal Grandfather   . Diabetes Other   . Esophageal cancer Neg Hx   . Liver cancer Neg Hx   . Pancreatic cancer Neg Hx   . Stomach cancer Neg Hx   .  Rectal cancer Neg Hx     Social History Social History   Tobacco Use  . Smoking status: Never Smoker  . Smokeless tobacco: Never Used  Substance Use Topics  . Alcohol use: No    Alcohol/week: 0.0 standard drinks    Frequency: Never  . Drug use: No    Review of Systems  Constitutional: No fever/chills Eyes: No visual changes. ENT: No sore throat. Cardiovascular: Denies chest pain. Respiratory: Denies shortness of breath. Gastrointestinal: Positive abdominal pain. Positive nausea and vomiting.  No diarrhea.  No constipation. Genitourinary: Negative for dysuria. Musculoskeletal: Negative for back pain. Skin: Negative for rash. Neurological: Negative for headaches, focal weakness or numbness.  10-point ROS otherwise negative.  ____________________________________________   PHYSICAL EXAM:  VITAL SIGNS: ED Triage Vitals  Enc Vitals Group     BP 02/18/19 1928 (!) 156/120     Pulse Rate 02/18/19 1437 (!) 110     Resp 02/18/19 1437 18     Temp 02/18/19 1437 98.2 F (36.8 C)     Temp Source 02/18/19 1437 Oral     SpO2 02/18/19 1437 100 %   Constitutional: Alert and initially standing at bedside. She is crying and holding her abdomen.  Eyes: Conjunctivae are normal. Head: Atraumatic. Nose: No congestion/rhinnorhea. Mouth/Throat: Mucous membranes are moist.  Neck: No stridor.   Cardiovascular: Tachycardia. Good peripheral circulation. Grossly normal heart sounds.   Respiratory: Normal respiratory effort.  No retractions. Lungs CTAB. Gastrointestinal: Soft with mild diffuse tenderness. No distention.  Musculoskeletal:  No gross deformities of extremities. Neurologic:  Normal speech and language.  Skin:  Skin is warm, dry and intact. No rash noted.   ____________________________________________   LABS (all labs ordered are listed, but only abnormal results are displayed)  Labs Reviewed  COMPREHENSIVE METABOLIC PANEL - Abnormal; Notable for the following  components:      Result Value   Potassium 3.3 (*)    Chloride 96 (*)    CO2 33 (*)    Glucose, Bld 235 (*)    BUN 43 (*)    All other components within normal limits  CBC - Abnormal; Notable for the following components:   RBC 3.82 (*)    Hemoglobin 11.0 (*)    HCT 33.9 (*)    All other components within normal limits  URINALYSIS, ROUTINE W REFLEX MICROSCOPIC - Abnormal; Notable for the following components:   Glucose, UA 150 (*)    Hgb urine dipstick MODERATE (*)    Ketones, ur 5 (*)    Protein, ur >=300 (*)    Bacteria, UA RARE (*)    All other components within normal limits  BLOOD GAS, VENOUS - Abnormal; Notable for the following components:   pO2, Ven 52.9 (*)    Bicarbonate 34.8 (*)    Acid-Base Excess 8.4 (*)    All other components within normal limits  CBG MONITORING, ED - Abnormal; Notable for the following components:   Glucose-Capillary 208 (*)    All other components within normal limits  LIPASE, BLOOD  I-STAT BETA HCG BLOOD, ED ([facility], [facility], [facility] ONLY)   ____________________________________________  RADIOLOGY  None ____________________________________________   PROCEDURES  Procedure(s) performed:   Procedures  None ____________________________________________   INITIAL IMPRESSION / ASSESSMENT AND PLAN / ED COURSE  Pertinent labs & imaging results that were available during my care of the patient were reviewed by me and considered in my medical decision making (see chart for details).   Patient returns to the emergency department for evaluation of vomiting.  No evidence of DKA on labs.  Patient very insistent on receiving opiates and benzodiazepines. Plan for Protonix, Haldol, and agreed to Fentanyl 50 mcg initially to control pain but no opiate afterwards due to concern for rebound pain.   EJ placed by nursing under my direct supervision. No complications. Patient feeling better after above medications. No additional opiates given other than initial  Fentanyl. No benzo given. Patient reports feeling ready for discharge and appears more comfortable. Discussed PCP and GI follow up plan.  ____________________________________________  FINAL CLINICAL IMPRESSION(S) / ED DIAGNOSES  Final diagnoses:  Generalized abdominal pain  Non-intractable vomiting with nausea, unspecified vomiting type     MEDICATIONS GIVEN DURING THIS VISIT:  Medications  sodium chloride flush (NS) 0.9 % injection 3 mL (3 mLs Intravenous Given 02/18/19 2140)  sodium chloride 0.9 % bolus 1,000 mL (0 mLs Intravenous Stopped 02/19/19 0005)  pantoprazole (PROTONIX) injection 40 mg (40 mg Intravenous Given 02/18/19 2139)  fentaNYL (SUBLIMAZE) injection 50 mcg (50 mcg Intravenous Given 02/18/19 2136)  haloperidol lactate (HALDOL) injection 2 mg (2 mg Intravenous Given 02/18/19 2135)    Note:  This document was prepared using Dragon voice recognition software and may include unintentional dictation errors.  Nanda Quinton, MD, [facility]    Long, [facility], MD 02/19/19 1013

## 2019-02-19 NOTE — ED Notes (Signed)
EJ placed under the observation of Dr. Laverta Baltimore.

## 2019-02-21 ENCOUNTER — Other Ambulatory Visit: Payer: Self-pay | Admitting: Family Medicine

## 2019-02-28 ENCOUNTER — Other Ambulatory Visit: Payer: Self-pay | Admitting: Family Medicine

## 2019-03-11 ENCOUNTER — Encounter (HOSPITAL_COMMUNITY): Payer: Self-pay | Admitting: Emergency Medicine

## 2019-03-11 ENCOUNTER — Other Ambulatory Visit: Payer: Self-pay

## 2019-03-11 ENCOUNTER — Emergency Department (HOSPITAL_COMMUNITY)
Admission: EM | Admit: 2019-03-11 | Discharge: 2019-03-12 | Disposition: A | Discharge status: Home / Self Care | Payer: 59 | Attending: Emergency Medicine | Admitting: Emergency Medicine

## 2019-03-11 DIAGNOSIS — J45909 Unspecified asthma, uncomplicated: Secondary | ICD-10-CM | POA: Diagnosis not present

## 2019-03-11 DIAGNOSIS — I10 Essential (primary) hypertension: Secondary | ICD-10-CM | POA: Insufficient documentation

## 2019-03-11 DIAGNOSIS — Z79899 Other long term (current) drug therapy: Secondary | ICD-10-CM | POA: Diagnosis not present

## 2019-03-11 DIAGNOSIS — E109 Type 1 diabetes mellitus without complications: Secondary | ICD-10-CM | POA: Diagnosis not present

## 2019-03-11 DIAGNOSIS — R112 Nausea with vomiting, unspecified: Secondary | ICD-10-CM | POA: Insufficient documentation

## 2019-03-11 DIAGNOSIS — Z794 Long term (current) use of insulin: Secondary | ICD-10-CM | POA: Insufficient documentation

## 2019-03-11 LAB — URINALYSIS, ROUTINE W REFLEX MICROSCOPIC
Bacteria, UA: NONE SEEN
Bilirubin Urine: NEGATIVE
Glucose, UA: 50 mg/dL — AB
Ketones, ur: 5 mg/dL — AB
Leukocytes,Ua: NEGATIVE
Nitrite: NEGATIVE
Protein, ur: >=300 mg/dL — AB
Specific Gravity, Urine: 1.012 (ref 1.005–1.030)
pH: 5 (ref 5.0–8.0)

## 2019-03-11 LAB — COMPREHENSIVE METABOLIC PANEL
ALT: 57 U/L — ABNORMAL HIGH (ref 0–44)
AST: 35 U/L (ref 15–41)
Albumin: 3.8 g/dL (ref 3.5–5.0)
Alkaline Phosphatase: 87 U/L (ref 38–126)
Anion gap: 16 — ABNORMAL HIGH (ref 5–15)
BUN: 23 mg/dL — ABNORMAL HIGH (ref 6–20)
CO2: 25 mmol/L (ref 22–32)
Calcium: 9.6 mg/dL (ref 8.9–10.3)
Chloride: 102 mmol/L (ref 98–111)
Creatinine, Ser: 1.05 mg/dL — ABNORMAL HIGH (ref 0.44–1.00)
GFR calc Af Amer: >60 mL/min (ref >60)
GFR calc non Af Amer: >60 mL/min (ref >60)
Glucose, Bld: 201 mg/dL — ABNORMAL HIGH (ref 70–99)
Potassium: 3.7 mmol/L (ref 3.5–5.1)
Sodium: 143 mmol/L (ref 135–145)
Total Bilirubin: 0.8 mg/dL (ref 0.3–1.2)
Total Protein: 8.1 g/dL (ref 6.5–8.1)

## 2019-03-11 LAB — CBC
HCT: 31 % — ABNORMAL LOW (ref 36.0–46.0)
Hemoglobin: 10 g/dL — ABNORMAL LOW (ref 12.0–15.0)
MCH: 27.9 pg (ref 26.0–34.0)
MCHC: 32.3 g/dL (ref 30.0–36.0)
MCV: 86.6 fL (ref 80.0–100.0)
Platelets: 231 10*3/uL (ref 150–400)
RBC: 3.58 MIL/uL — ABNORMAL LOW (ref 3.87–5.11)
RDW: 11.8 % (ref 11.5–15.5)
WBC: 4.8 10*3/uL (ref 4.0–10.5)
nRBC: 0 % (ref 0.0–0.2)

## 2019-03-11 LAB — LIPASE, BLOOD: Lipase: 18 U/L (ref 11–51)

## 2019-03-11 LAB — CBG MONITORING, ED: Glucose-Capillary: 169 mg/dL — ABNORMAL HIGH (ref 70–99)

## 2019-03-11 MED ORDER — SODIUM CHLORIDE 0.9 % IV BOLUS
1000.0000 mL | Freq: Once | INTRAVENOUS | Status: AC
  Administered 2019-03-11: 1000 mL via INTRAVENOUS

## 2019-03-11 MED ORDER — PANTOPRAZOLE SODIUM 40 MG IV SOLR
40.0000 mg | Freq: Once | INTRAVENOUS | Status: AC
  Administered 2019-03-11: 40 mg via INTRAVENOUS
  Filled 2019-03-11: qty 40

## 2019-03-11 MED ORDER — HALOPERIDOL LACTATE 5 MG/ML IJ SOLN
2.0000 mg | Freq: Once | INTRAMUSCULAR | Status: AC
  Administered 2019-03-11: 2 mg via INTRAMUSCULAR
  Filled 2019-03-11: qty 1

## 2019-03-11 MED ORDER — SODIUM CHLORIDE 0.9% FLUSH: 3.0000 mL | Freq: Once | INTRAVENOUS | Status: DC

## 2019-03-11 MED ORDER — ONDANSETRON HCL 4 MG/2ML IJ SOLN
4.0000 mg | Freq: Once | INTRAMUSCULAR | Status: AC
  Administered 2019-03-11: 4 mg via INTRAVENOUS
  Filled 2019-03-11: qty 2

## 2019-03-11 NOTE — ED Triage Notes (Signed)
Patient c/o abdominal pain with N/V x2 days.

## 2019-03-11 NOTE — ED Provider Notes (Signed)
[facility] DEPT Provider Note   CSN: [id number] Arrival date & time: 03/11/19  2214     History Chief Complaint  Patient presents with  . Emesis  . Abdominal Pain    [name] is a 28 y.o. female.  28 year old female with prior medical history detailed below presents for evaluation of nausea and vomiting.  Patient reports onset of nausea and vomiting today.  She does have a prior history significant for gastroparesis and insulin-dependent diabetes.  She reports worsening nausea and vomiting over the course of today.  She denies fever.  Symptoms today are consistent with an similar to prior presentations for gastroparesis related nausea and vomiting.  The history is provided by the patient and medical records.  Emesis Severity:  Mild Duration:  1 day Timing:  Constant Progression:  Worsening Chronicity:  New Recent urination:  Normal Relieved by:  Nothing Worsened by:  Nothing Ineffective treatments:  Antiemetics      Past Medical History:  Diagnosis Date  . Allergy   . Anemia   . Anxiety   . Blood transfusion without reported diagnosis    Dec 2018  . Cataract    right eye  . Depression   . Diabetes type 1, uncontrolled ([place]) 11/14/2011   Since age 32  . Fibromyalgia   . Gastroparesis   . GERD (gastroesophageal reflux disease)   . Hypertension   . Infection    UTI April 2016    Patient Active Problem List   Diagnosis Date Noted  . Contraception management 08/06/2018  . Cyclical vomiting syndrome 06/07/2018  . Diabetic gastroparesis associated with type 1 diabetes mellitus ([place]) 06/04/2018  . Anxiety 05/31/2018  . Peripheral edema 05/31/2018  . Normocytic anemia 07/10/2017  . GERD (gastroesophageal reflux disease) 06/23/2017  . 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 Diabetes mellitus type 1 ([place]) 04/28/2013  . Protein-calorie malnutrition, severe ([place]) 04/28/2013  . Hypertension  associated with diabetes ([place]) 11/14/2011  . Chronic abdominal pain 09/18/2011    Past Surgical History:  Procedure Laterality Date  . ANKLE SURGERY    . CHOLECYSTECTOMY  11/15/2011   Procedure: LAPAROSCOPIC CHOLECYSTECTOMY WITH INTRAOPERATIVE CHOLANGIOGRAM;  Surgeon: [name], MD;  Location: [facility];  Service: General;  Laterality: N/A;  . COLONOSCOPY    . COLONOSCOPY WITH PROPOFOL N/A 06/27/2017   Procedure: COLONOSCOPY WITH PROPOFOL;  Surgeon: Milus Banister, MD;  Location: [facility];  Service: Endoscopy;  Laterality: N/A;  . ESOPHAGOGASTRODUODENOSCOPY  12/03/2011   Procedure: ESOPHAGOGASTRODUODENOSCOPY (EGD);  Surgeon: Beryle Beams, MD;  Location: [facility];  Service: Endoscopy;  Laterality: N/A;  . FLEXIBLE SIGMOIDOSCOPY N/A 03/10/2017   Procedure: FLEXIBLE SIGMOIDOSCOPY;  Surgeon: Carol Ada, MD;  Location: [facility];  Service: Endoscopy;  Laterality: N/A;  . INCISION AND DRAINAGE PERIRECTAL ABSCESS N/A 03/01/2017   Procedure: IRRIGATION AND DEBRIDEMENT PERIRECTAL ABSCESS;  Surgeon: Alphonsa Overall, MD;  Location: [facility];  Service: General;  Laterality: N/A;  . IRRIGATION AND DEBRIDEMENT BUTTOCKS N/A 03/23/2017   Procedure: IRRIGATION AND DEBRIDEMENT BUTTOCKS, SETON PLACEMENT;  Surgeon: [name], MD;  Location: [facility];  Service: General;  Laterality: N/A;  . LAPAROSCOPY  11/23/2011   Procedure: LAPAROSCOPY DIAGNOSTIC;  Surgeon: Edward Jolly, MD;  Location: [facility];  Service: General;  Laterality: N/A;  . SIGMOIDOSCOPY    . UPPER GASTROINTESTINAL ENDOSCOPY    . WISDOM TOOTH EXTRACTION       OB History    Gravida  1  Para  1   Term      Preterm  1   AB      Living  1     SAB      TAB      Ectopic      Multiple  0   Live Births  1           Family History  Problem Relation Age of Onset  . Diabetes Mother   . Hypertension Father   . Colon cancer Paternal Grandmother        pt thinks PGM was dx in her 87's  . Diabetes Paternal  Grandmother   . Diabetes Maternal Grandmother   . Diabetes Maternal Grandfather   . Diabetes Paternal Grandfather   . Diabetes Other   . Esophageal cancer Neg Hx   . Liver cancer Neg Hx   . Pancreatic cancer Neg Hx   . Stomach cancer Neg Hx   . Rectal cancer Neg Hx     Social History   Tobacco Use  . Smoking status: Never Smoker  . Smokeless tobacco: Never Used  Substance Use Topics  . Alcohol use: No    Alcohol/week: 0.0 standard drinks  . Drug use: No    Home Medications Prior to Admission medications   Medication Sig Start Date End Date Taking? Authorizing Provider  Continuous Blood Gluc Receiver (DEXCOM G6 RECEIVER) DEVI USE AS DIRECTED TO MONITOR GLUCOSE LEVELS 10/29/18   [provider]  Continuous Blood Gluc Sensor (DEXCOM G6 SENSOR) MISC 1 each by Does not apply route See admin instructions. every 10 days. 12/31/18   Renato Shin, MD  Continuous Blood Gluc Transmit (DEXCOM G6 TRANSMITTER) MISC USE AS DIRECTED TO MONITOR GLUCOSE LEVELS 10/29/18   [provider]  cyclobenzaprine (FLEXERIL) 10 MG tablet TAKE 1 TABLET(10 MG) BY MOUTH THREE TIMES DAILY AS NEEDED FOR MUSCLE SPASMS 02/21/19   [name], MD  dicyclomine (BENTYL) 20 MG tablet Take 1 tablet (20 mg total) by mouth 3 (three) times daily as needed for spasms. 02/18/19   Long, Wonda Olds, MD  furosemide (LASIX) 40 MG tablet TAKE 1 TABLET BY MOUTH  DAILY Patient taking differently: Take 40 mg by mouth daily.  11/11/18   Armbruster, Carlota Raspberry, MD  hydrALAZINE (APRESOLINE) 50 MG tablet Take 1 tablet (50 mg total) by mouth every 6 (six) hours. 10/23/18   Swayze, Ava, DO  Insulin Glargine (LANTUS SOLOSTAR) 100 UNIT/ML Solostar Pen Inject 10 Units into the skin at bedtime. And pen needles 4/day Patient taking differently: Inject 12 Units into the skin at bedtime. And pen needles 4/day 12/31/18   Renato Shin, MD  insulin lispro (HUMALOG KWIKPEN) 100 UNIT/ML KwikPen Inject 0.06 mLs (6 Units total) into the  skin 3 (three) times daily. Patient taking differently: Inject 5 Units into the skin 3 (three) times daily.  12/31/18   Renato Shin, MD  LORazepam (ATIVAN) 0.5 MG tablet TAKE 1 TABLET BY MOUTH TWICE DAILY AS NEEDED FOR ANXIETY Patient taking differently: Take 0.5 mg by mouth 2 (two) times daily as needed for anxiety.  02/10/19   [name], MD  metoCLOPramide (REGLAN) 10 MG tablet Take 1 tablet (10 mg total) by mouth every 6 (six) hours as needed for nausea or vomiting. 02/20/18   Shelly Coss, MD  metoprolol tartrate (LOPRESSOR) 25 MG tablet Take 1 tablet (25 mg total) by mouth 2 (two) times daily. 10/23/18   Swayze, Ava, DO  mirtazapine (REMERON) 30 MG tablet  TAKE 1 TABLET BY MOUTH AT  BEDTIME Patient taking differently: Take 30 mg by mouth at bedtime.  02/16/19   [name], MD  ondansetron (ZOFRAN) 4 MG tablet Take 1 tablet (4 mg total) by mouth every 8 (eight) hours as needed for nausea or vomiting (If necessary, dont combine with Reglan.). 09/21/18   Aline August, MD  pantoprazole (PROTONIX) 40 MG tablet TAKE 1 TABLET BY MOUTH  DAILY Patient taking differently: Take 40 mg by mouth daily.  10/22/18   Armbruster, Carlota Raspberry, MD  potassium chloride 20 MEQ TBCR Take 20 mEq by mouth 2 (two) times daily. 10/17/18   Isla Pence, MD  promethazine (PHENERGAN) 12.5 MG tablet TAKE 1 TABLET BY MOUTH  EVERY 8 HOURS AS NEEDED FOR NAUSEA AND VOMITING Patient taking differently: Take 12.5 mg by mouth every 8 (eight) hours as needed for nausea or vomiting.  02/15/19   Armbruster, Carlota Raspberry, MD  promethazine (PHENERGAN) 25 MG suppository Place 1 suppository (25 mg total) rectally every 8 (eight) hours as needed for nausea or vomiting. 12/17/18   Armbruster, Carlota Raspberry, MD  traMADol (ULTRAM) 50 MG tablet TAKE 1 TABLET(50 MG) BY MOUTH EVERY 6 HOURS AS NEEDED FOR MODERATE PAIN OR SEVERE PAIN Patient taking differently: Take 50 mg by mouth every 6 (six) hours as needed for moderate pain or severe pain.   02/10/19   [name], MD    Allergies    Other and Lactose intolerance (gi)  Review of Systems   Review of Systems  Gastrointestinal: Positive for vomiting.  All other systems reviewed and are negative.   Physical Exam Updated Vital Signs BP (!) 142/112 (BP Location: Left Arm)   Pulse (!) 101   Temp 98 F (36.7 C) (Oral)   SpO2 95%   Physical Exam Vitals and nursing note reviewed.  Constitutional:      General: She is not in acute distress.    Appearance: She is well-developed.  HENT:     Head: Normocephalic and atraumatic.  Eyes:     Conjunctiva/sclera: Conjunctivae normal.     Pupils: Pupils are equal, round, and reactive to light.  Cardiovascular:     Rate and Rhythm: Normal rate and regular rhythm.     Heart sounds: Normal heart sounds.  Pulmonary:     Effort: Pulmonary effort is normal. No respiratory distress.     Breath sounds: Normal breath sounds.  Abdominal:     General: Abdomen is flat. There is no distension.     Palpations: Abdomen is soft.     Tenderness: There is no abdominal tenderness.  Musculoskeletal:        General: No deformity. Normal range of motion.     Cervical back: Normal range of motion and neck supple.  Skin:    General: Skin is warm and dry.  Neurological:     General: No focal deficit present.     Mental Status: She is alert and oriented to person, place, and time.     ED Results / Procedures / Treatments   Labs (all labs ordered are listed, but only abnormal results are displayed) Labs Reviewed  COMPREHENSIVE METABOLIC PANEL - Abnormal; Notable for the following components:      Result Value   Glucose, Bld 201 (*)    BUN 23 (*)    Creatinine, Ser 1.05 (*)    ALT 57 (*)    Anion gap 16 (*)    All other components within normal limits  CBC - Abnormal; Notable for the following components:   RBC 3.58 (*)    Hemoglobin 10.0 (*)    HCT 31.0 (*)    All other components within normal limits  URINALYSIS, ROUTINE W  REFLEX MICROSCOPIC - Abnormal; Notable for the following components:   Glucose, UA 50 (*)    Hgb urine dipstick MODERATE (*)    Ketones, ur 5 (*)    Protein, ur >=300 (*)    All other components within normal limits  CBG MONITORING, ED - Abnormal; Notable for the following components:   Glucose-Capillary 169 (*)    All other components within normal limits  LIPASE, BLOOD  I-STAT BETA HCG BLOOD, ED ([facility], [facility], [facility] ONLY)    EKG None  Radiology No results found.  Procedures Procedures (including critical care time)  Medications Ordered in ED Medications  sodium chloride flush (NS) 0.9 % injection 3 mL (0 mLs Intravenous Hold 03/11/19 2302)  sodium chloride 0.9 % bolus 1,000 mL (1,000 mLs Intravenous New Bag/Given 03/11/19 2353)  pantoprazole (PROTONIX) injection 40 mg (40 mg Intravenous Given 03/11/19 2353)  haloperidol lactate (HALDOL) injection 2 mg (2 mg Intramuscular Given 03/11/19 2353)  ondansetron (ZOFRAN) injection 4 mg (4 mg Intravenous Given 03/11/19 2353)    ED Course  I have reviewed the triage vital signs and the nursing notes.  Pertinent labs & imaging results that were available during my care of the patient were reviewed by me and considered in my medical decision making (see chart for details).    MDM Rules/Calculators/A&P  MDM  Screen complete  [name] was evaluated in Emergency Department on 03/11/2019 for the symptoms described in the history of present illness. She was evaluated in the context of the global COVID-19 pandemic, which necessitated consideration that the patient might be at risk for infection with the SARS-CoV-2 virus that causes COVID-19. Institutional protocols and algorithms that pertain to the evaluation of patients at risk for COVID-19 are in a state of rapid change based on information released by regulatory bodies including the CDC and federal and state organizations. These policies and algorithms were followed during the patient's  care in the ED.  Patient is presenting for evaluation of reported nausea and vomiting.  She has longstanding history of gastroparesis. Symptoms today appear to most likely be secondary to gastroparesis.   Screening labs did not suggest significant pathology such as DKA (BG 201, CO2 25, AGAP 16).   Following her initial ED treatment she does appear to be improved.   Patient did request narcotics at several times for treatment of abdominal cramping.  She was advised that narcotics would likely worsen her gastroparesis symptoms.  Following her IV fluids patient feels improved and appears to be prepping for discharge.  Strict return precautions given understood.  Importance of close follow-up is stressed.   Final Clinical Impression(s) / ED Diagnoses Final diagnoses:  Nausea and vomiting, intractability of vomiting not specified, unspecified vomiting type    Rx / DC Orders ED Discharge Orders    None       Valarie Merino, MD 03/12/19 0045

## 2019-03-12 NOTE — Discharge Instructions (Addendum)
Please return for any problem.  Follow-up with your regular care provider as instructed. °

## 2019-03-13 ENCOUNTER — Other Ambulatory Visit: Payer: Self-pay | Admitting: Family Medicine

## 2019-03-13 ENCOUNTER — Emergency Department (HOSPITAL_COMMUNITY)
Admission: EM | Admit: 2019-03-13 | Discharge: 2019-03-13 | Discharge status: Against Medical Advice | Payer: 59 | Attending: Emergency Medicine | Admitting: Emergency Medicine

## 2019-03-13 ENCOUNTER — Encounter (HOSPITAL_COMMUNITY): Payer: Self-pay

## 2019-03-13 ENCOUNTER — Emergency Department (HOSPITAL_COMMUNITY): Payer: 59

## 2019-03-13 ENCOUNTER — Other Ambulatory Visit: Payer: Self-pay

## 2019-03-13 DIAGNOSIS — Z79899 Other long term (current) drug therapy: Secondary | ICD-10-CM | POA: Diagnosis not present

## 2019-03-13 DIAGNOSIS — E109 Type 1 diabetes mellitus without complications: Secondary | ICD-10-CM | POA: Diagnosis not present

## 2019-03-13 DIAGNOSIS — M549 Dorsalgia, unspecified: Secondary | ICD-10-CM | POA: Diagnosis present

## 2019-03-13 DIAGNOSIS — Z794 Long term (current) use of insulin: Secondary | ICD-10-CM | POA: Insufficient documentation

## 2019-03-13 DIAGNOSIS — R112 Nausea with vomiting, unspecified: Secondary | ICD-10-CM

## 2019-03-13 DIAGNOSIS — I10 Essential (primary) hypertension: Secondary | ICD-10-CM | POA: Diagnosis not present

## 2019-03-13 DIAGNOSIS — M797 Fibromyalgia: Secondary | ICD-10-CM | POA: Diagnosis not present

## 2019-03-13 DIAGNOSIS — R1084 Generalized abdominal pain: Secondary | ICD-10-CM

## 2019-03-13 LAB — CBC
HCT: 31 % — ABNORMAL LOW (ref 36.0–46.0)
Hemoglobin: 9.9 g/dL — ABNORMAL LOW (ref 12.0–15.0)
MCH: 27 pg (ref 26.0–34.0)
MCHC: 31.9 g/dL (ref 30.0–36.0)
MCV: 84.7 fL (ref 80.0–100.0)
Platelets: 260 10*3/uL (ref 150–400)
RBC: 3.66 MIL/uL — ABNORMAL LOW (ref 3.87–5.11)
RDW: 11.8 % (ref 11.5–15.5)
WBC: 5.6 10*3/uL (ref 4.0–10.5)
nRBC: 0 % (ref 0.0–0.2)

## 2019-03-13 LAB — BASIC METABOLIC PANEL
Anion gap: 5 (ref 5–15)
BUN: 25 mg/dL — ABNORMAL HIGH (ref 6–20)
CO2: 31 mmol/L (ref 22–32)
Calcium: 9.3 mg/dL (ref 8.9–10.3)
Chloride: 95 mmol/L — ABNORMAL LOW (ref 98–111)
Creatinine, Ser: 0.87 mg/dL (ref 0.44–1.00)
GFR calc Af Amer: >60 mL/min (ref >60)
GFR calc non Af Amer: >60 mL/min (ref >60)
Glucose, Bld: 189 mg/dL — ABNORMAL HIGH (ref 70–99)
Potassium: 3.3 mmol/L — ABNORMAL LOW (ref 3.5–5.1)
Sodium: 131 mmol/L — ABNORMAL LOW (ref 135–145)

## 2019-03-13 LAB — I-STAT BETA HCG BLOOD, ED (MC, WL, AP ONLY): I-stat hCG, quantitative: <5 m[IU]/mL (ref <5)

## 2019-03-13 LAB — TROPONIN I (HIGH SENSITIVITY): Troponin I (High Sensitivity): <2 ng/L (ref <18)

## 2019-03-13 IMAGING — CR DG CHEST 2V
2 series · 2 of 2 positions shown · non-contrast
Comparison: Chest radiograph [DATE], [DATE]

CLINICAL DATA: Nausea, vomiting, chest pain, SOB, cough

EXAM:
CHEST - 2 VIEW

[w chest pa]
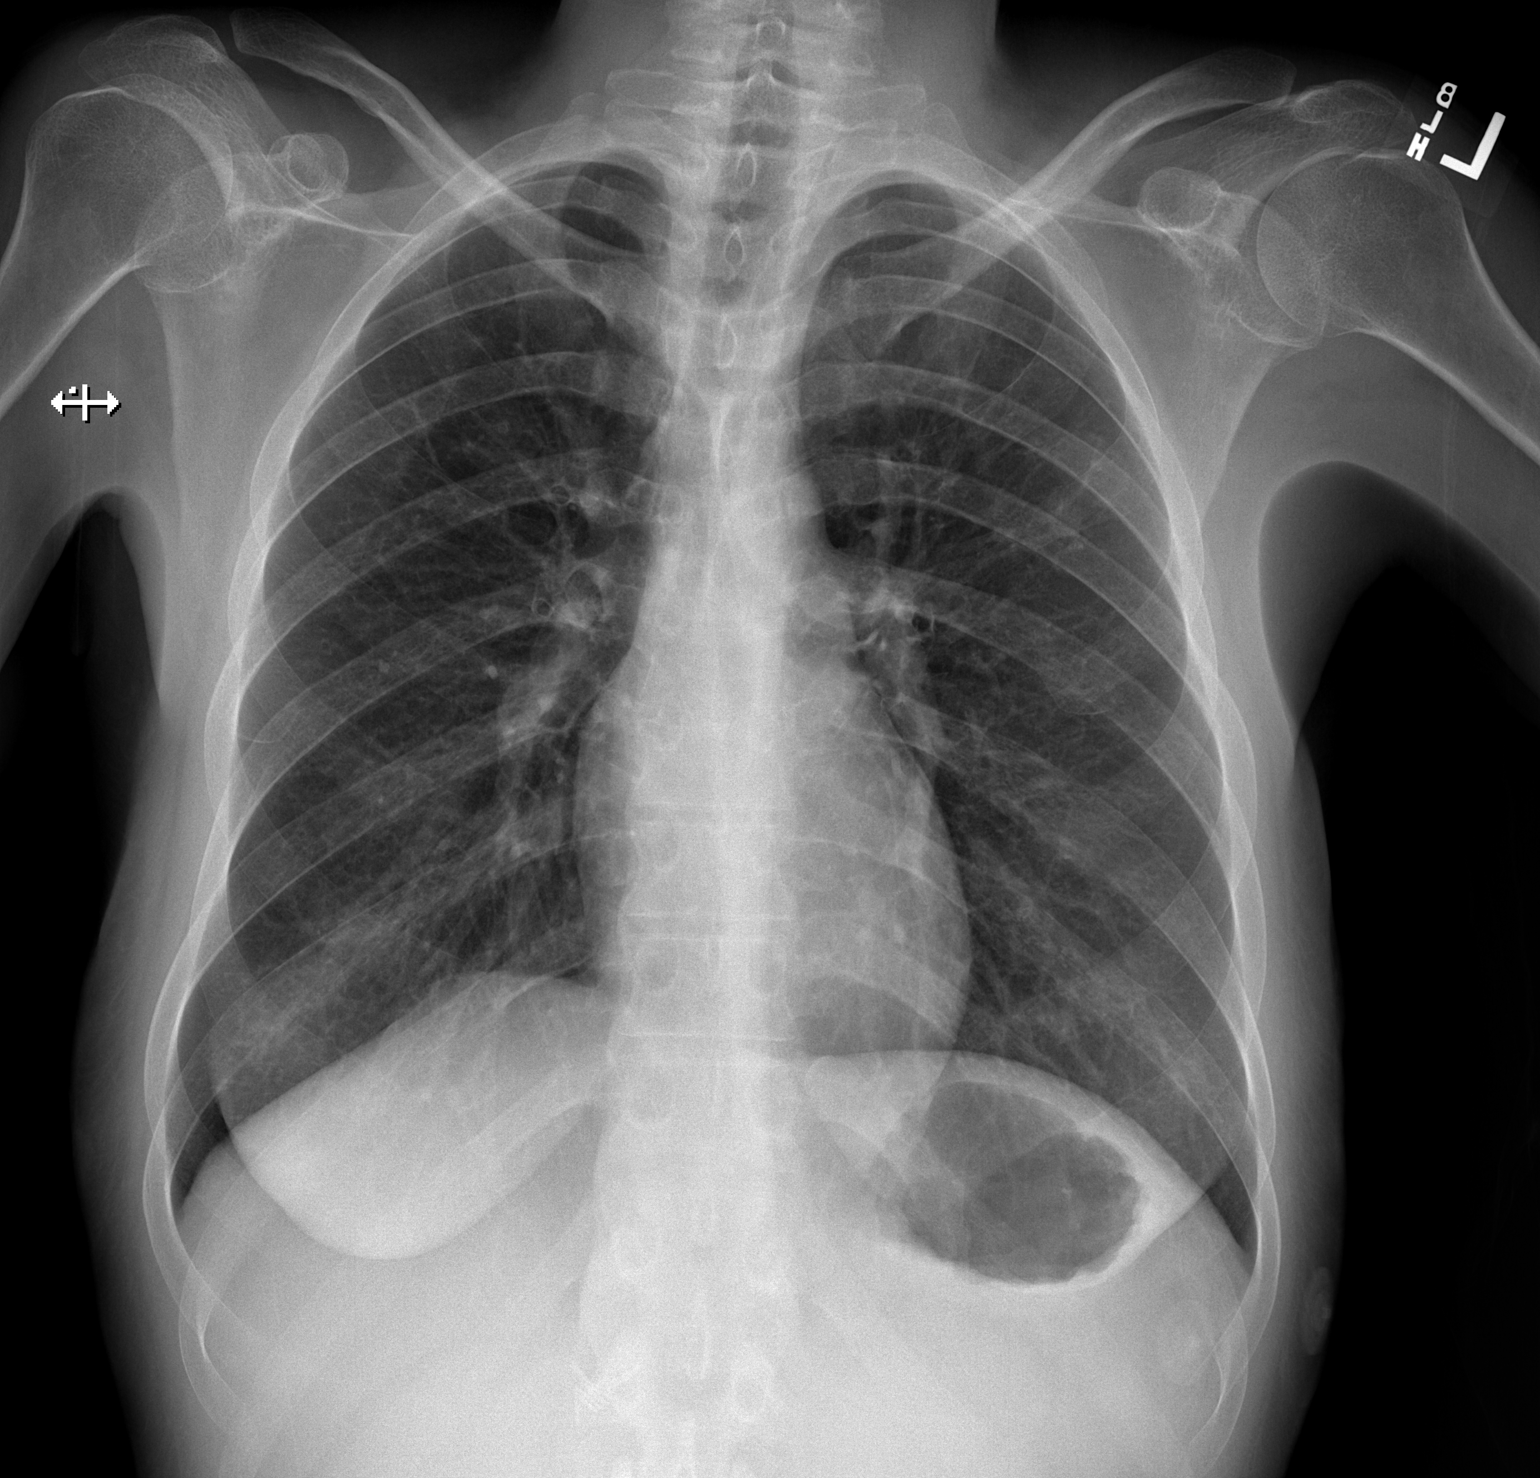

[w chest lat]
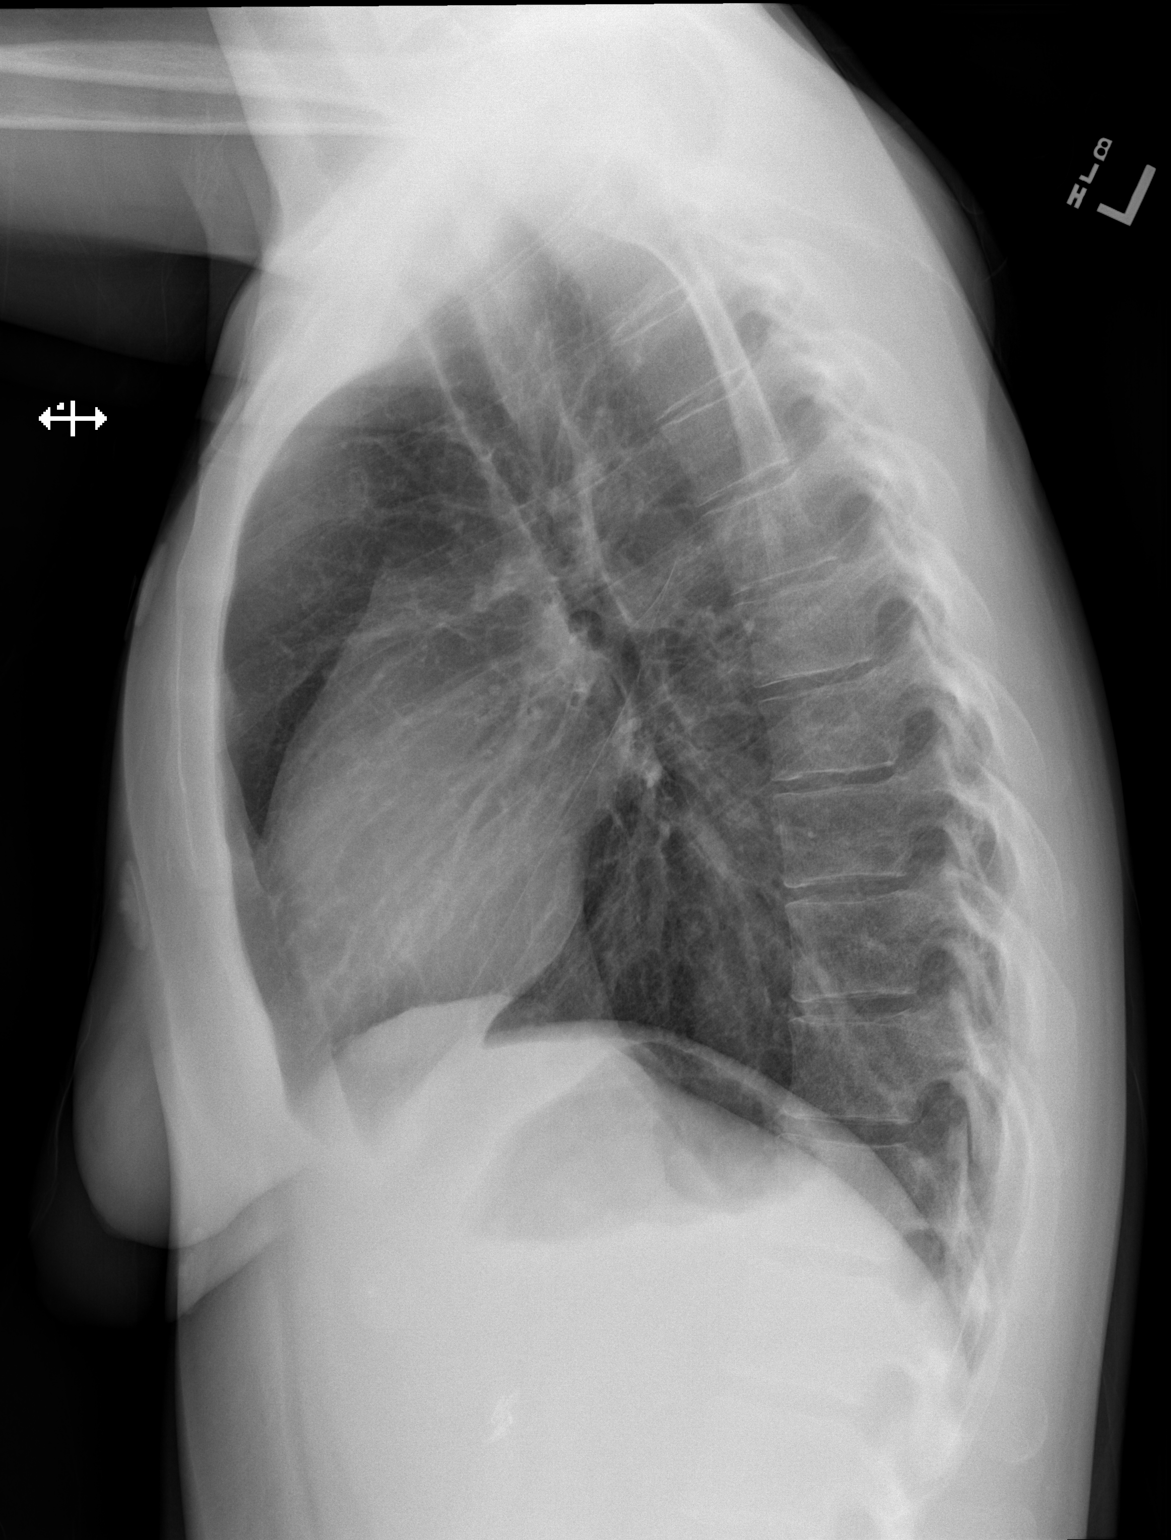

[2 of 2 positions shown; findings below may reference images not displayed]

FINDINGS: The heart size and mediastinal contours are within normal limits.
The lungs are clear. No pneumothorax or pleural effusion. The
visualized skeletal structures are unremarkable.
IMPRESSION: No evidence of active disease in the chest.

## 2019-03-13 MED ORDER — SODIUM CHLORIDE 0.9% FLUSH: 3.0000 mL | Freq: Once | INTRAVENOUS | Status: DC

## 2019-03-13 MED ORDER — CAPSAICIN 0.025 % EX CREA
TOPICAL_CREAM | Freq: Two times a day (BID) | CUTANEOUS | Status: DC
  Filled 2019-03-13: qty 60

## 2019-03-13 MED ORDER — HALOPERIDOL LACTATE 5 MG/ML IJ SOLN
2.0000 mg | Freq: Once | INTRAMUSCULAR | Status: AC
  Administered 2019-03-13: 2 mg via INTRAVENOUS
  Filled 2019-03-13: qty 1

## 2019-03-13 MED ORDER — METOCLOPRAMIDE HCL 5 MG/ML IJ SOLN
10.0000 mg | Freq: Once | INTRAMUSCULAR | Status: AC
  Administered 2019-03-13: 10 mg via INTRAVENOUS
  Filled 2019-03-13: qty 2

## 2019-03-13 MED ORDER — SODIUM CHLORIDE 0.9 % IV BOLUS
1000.0000 mL | Freq: Once | INTRAVENOUS | Status: AC
  Administered 2019-03-13: 1000 mL via INTRAVENOUS

## 2019-03-13 NOTE — ED Provider Notes (Signed)
[facility] DEPT Provider Note   CSN: [id number] Arrival date & time: 03/13/19  1227     History Chief Complaint  Patient presents with  . Back Pain  . Chest Pain  . Emesis    [name] is a 28 y.o. female.  HPI   She presents for ongoing nausea, vomiting, back pain and abdominal pain present for 3 to 4 days.  Previously evaluated for same, 2 days ago in the ED here.  The patient has frequent and sometimes sequential visits to the ED for similar problems.  She states she has not seen her GI doctor recently.  Patient attributes her vomiting and pain to gastroparesis.  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  She denies blood in emesis.  She denies fever, chills, cough, shortness of breath, focal weakness or paresthesia.  She is taking her usual medicines including insulin, without relief.  She states that she is out of her tramadol.  There are no other known modifying factors.  Past Medical History:  Diagnosis Date  . Allergy   . Anemia   . Anxiety   . Blood transfusion without reported diagnosis    Dec 2018  . Cataract    right eye  . Depression   . Diabetes type 1, uncontrolled ([place]) 11/14/2011   Since age 64  . Fibromyalgia   . Gastroparesis   . GERD (gastroesophageal reflux disease)   . Hypertension   . Infection    UTI April 2016    Patient Active Problem List   Diagnosis Date Noted  . Contraception management 08/06/2018  . Cyclical vomiting syndrome 06/07/2018  . Diabetic gastroparesis associated with type 1 diabetes mellitus ([place]) 06/04/2018  . Anxiety 05/31/2018  . Peripheral edema 05/31/2018  . Normocytic anemia 07/10/2017  . GERD (gastroesophageal reflux disease) 06/23/2017  . MDD (major depressive disorder), recurrent episode, mild ([place]) 02/28/2017  . Diabetes mellitus type 1 ([place]) 04/28/2013  . Protein-calorie malnutrition, severe ([place]) 04/28/2013  .  Hypertension associated with diabetes ([place]) 11/14/2011  . Chronic abdominal pain 09/18/2011    Past Surgical History:  Procedure Laterality Date  . ANKLE SURGERY    . CHOLECYSTECTOMY  11/15/2011   Procedure: LAPAROSCOPIC CHOLECYSTECTOMY WITH INTRAOPERATIVE CHOLANGIOGRAM;  Surgeon: [name], MD;  Location: [facility];  Service: General;  Laterality: N/A;  . COLONOSCOPY    . COLONOSCOPY WITH PROPOFOL N/A 06/27/2017   Procedure: COLONOSCOPY WITH PROPOFOL;  Surgeon: Milus Banister, MD;  Location: [facility];  Service: Endoscopy;  Laterality: N/A;  . ESOPHAGOGASTRODUODENOSCOPY  12/03/2011   Procedure: ESOPHAGOGASTRODUODENOSCOPY (EGD);  Surgeon: Beryle Beams, MD;  Location: [facility];  Service: Endoscopy;  Laterality: N/A;  . FLEXIBLE SIGMOIDOSCOPY N/A 03/10/2017   Procedure: FLEXIBLE SIGMOIDOSCOPY;  Surgeon: Carol Ada, MD;  Location: [facility];  Service: Endoscopy;  Laterality: N/A;  . INCISION AND DRAINAGE PERIRECTAL ABSCESS N/A 03/01/2017   Procedure: IRRIGATION AND DEBRIDEMENT PERIRECTAL ABSCESS;  Surgeon: Alphonsa Overall, MD;  Location: [facility];  Service: General;  Laterality: N/A;  . IRRIGATION AND DEBRIDEMENT BUTTOCKS N/A 03/23/2017   Procedure: IRRIGATION AND DEBRIDEMENT BUTTOCKS, SETON PLACEMENT;  Surgeon: [name], MD;  Location: [facility];  Service: General;  Laterality: N/A;  . LAPAROSCOPY  11/23/2011   Procedure: LAPAROSCOPY DIAGNOSTIC;  Surgeon: Edward Jolly, MD;  Location: [facility];  Service: General;  Laterality: N/A;  . SIGMOIDOSCOPY    . UPPER GASTROINTESTINAL ENDOSCOPY    . WISDOM TOOTH  EXTRACTION       OB History    Gravida  1   Para  1   Term      Preterm  1   AB      Living  1     SAB      TAB      Ectopic      Multiple  0   Live Births  1           Family History  Problem Relation Age of Onset  . Diabetes Mother   . Hypertension Father   . Colon cancer Paternal Grandmother        pt thinks PGM was dx in her 34's  .  Diabetes Paternal Grandmother   . Diabetes Maternal Grandmother   . Diabetes Maternal Grandfather   . Diabetes Paternal Grandfather   . Diabetes Other   . Esophageal cancer Neg Hx   . Liver cancer Neg Hx   . Pancreatic cancer Neg Hx   . Stomach cancer Neg Hx   . Rectal cancer Neg Hx     Social History   Tobacco Use  . Smoking status: Never Smoker  . Smokeless tobacco: Never Used  Substance Use Topics  . Alcohol use: No    Alcohol/week: 0.0 standard drinks  . Drug use: Yes    Types: Marijuana    Home Medications Prior to Admission medications   Medication Sig Start Date End Date Taking? Authorizing Provider  Continuous Blood Gluc Receiver (DEXCOM G6 RECEIVER) DEVI USE AS DIRECTED TO MONITOR GLUCOSE LEVELS 10/29/18  Yes [provider]  Continuous Blood Gluc Sensor (DEXCOM G6 SENSOR) MISC 1 each by Does not apply route See admin instructions. every 10 days. 12/31/18  Yes Renato Shin, MD  Continuous Blood Gluc Transmit (DEXCOM G6 TRANSMITTER) MISC USE AS DIRECTED TO MONITOR GLUCOSE LEVELS 10/29/18  Yes [provider]  cyclobenzaprine (FLEXERIL) 10 MG tablet TAKE 1 TABLET(10 MG) BY MOUTH THREE TIMES DAILY AS NEEDED FOR MUSCLE SPASMS Patient taking differently: Take 10 mg by mouth 3 (three) times daily as needed for muscle spasms.  02/21/19  Yes [name], MD  dicyclomine (BENTYL) 20 MG tablet Take 1 tablet (20 mg total) by mouth 3 (three) times daily as needed for spasms. 02/18/19  Yes Long, Wonda Olds, MD  furosemide (LASIX) 40 MG tablet TAKE 1 TABLET BY MOUTH  DAILY Patient taking differently: Take 40 mg by mouth daily.  11/11/18  Yes Armbruster, Carlota Raspberry, MD  hydrALAZINE (APRESOLINE) 50 MG tablet Take 1 tablet (50 mg total) by mouth every 6 (six) hours. 10/23/18  Yes Swayze, Ava, DO  Insulin Glargine (LANTUS SOLOSTAR) 100 UNIT/ML Solostar Pen Inject 10 Units into the skin at bedtime. And pen needles 4/day Patient taking differently: Inject 12 Units into the  skin at bedtime. And pen needles 4/day 12/31/18  Yes Renato Shin, MD  insulin lispro (HUMALOG KWIKPEN) 100 UNIT/ML KwikPen Inject 0.06 mLs (6 Units total) into the skin 3 (three) times daily. Patient taking differently: Inject 5 Units into the skin 3 (three) times daily.  12/31/18  Yes Renato Shin, MD  LORazepam (ATIVAN) 0.5 MG tablet TAKE 1 TABLET BY MOUTH TWICE DAILY AS NEEDED FOR ANXIETY Patient taking differently: Take 0.5 mg by mouth 2 (two) times daily as needed for anxiety.  02/10/19  Yes [name], MD  metoCLOPramide (REGLAN) 10 MG tablet Take 1 tablet (10 mg total) by mouth every 6 (six) hours as needed for  nausea or vomiting. 02/20/18  Yes Shelly Coss, MD  metoprolol tartrate (LOPRESSOR) 25 MG tablet Take 1 tablet (25 mg total) by mouth 2 (two) times daily. 10/23/18  Yes Swayze, Ava, DO  mirtazapine (REMERON) 30 MG tablet TAKE 1 TABLET BY MOUTH AT  BEDTIME Patient taking differently: Take 30 mg by mouth at bedtime.  02/16/19  Yes [name], MD  pantoprazole (PROTONIX) 40 MG tablet TAKE 1 TABLET BY MOUTH  DAILY Patient taking differently: Take 40 mg by mouth daily.  10/22/18  Yes Armbruster, Carlota Raspberry, MD  promethazine (PHENERGAN) 12.5 MG tablet TAKE 1 TABLET BY MOUTH  EVERY 8 HOURS AS NEEDED FOR NAUSEA AND VOMITING Patient taking differently: Take 12.5 mg by mouth every 8 (eight) hours as needed for nausea or vomiting.  02/15/19  Yes Armbruster, Carlota Raspberry, MD  ondansetron (ZOFRAN) 4 MG tablet Take 1 tablet (4 mg total) by mouth every 8 (eight) hours as needed for nausea or vomiting (If necessary, dont combine with Reglan.). Patient not taking: Reported on 03/13/2019 09/21/18   Aline August, MD  potassium chloride 20 MEQ TBCR Take 20 mEq by mouth 2 (two) times daily. Patient not taking: Reported on 03/13/2019 10/17/18   Isla Pence, MD  promethazine (PHENERGAN) 25 MG suppository Place 1 suppository (25 mg total) rectally every 8 (eight) hours as needed for nausea or  vomiting. Patient not taking: Reported on 03/13/2019 12/17/18   Armbruster, Carlota Raspberry, MD  traMADol (ULTRAM) 50 MG tablet TAKE 1 TABLET(50 MG) BY MOUTH EVERY 6 HOURS AS NEEDED FOR MODERATE PAIN OR SEVERE PAIN 03/14/19   [name], MD    Allergies    Other and Lactose intolerance (gi)  Review of Systems   Review of Systems  All other systems reviewed and are negative.   Physical Exam Updated Vital Signs BP (!) 166/102   Pulse (!) 119   Temp 97.6 F (36.4 C)   Resp 12   Ht 5' 7"  (1.702 m)   Wt 63.5 kg   LMP 02/28/2019 (Approximate)   SpO2 99%   BMI 21.93 kg/m   Physical Exam Vitals and nursing note reviewed.  Constitutional:      General: She is not in acute distress.    Appearance: She is well-developed. She is ill-appearing. She is not toxic-appearing or diaphoretic.     Comments: Underweight  HENT:     Head: Normocephalic and atraumatic.     Right Ear: External ear normal.     Left Ear: External ear normal.     Nose:     Comments: Mucous membranes are moist Eyes:     Conjunctiva/sclera: Conjunctivae normal.     Pupils: Pupils are equal, round, and reactive to light.     Comments: No scleral icterus  Neck:     Trachea: Phonation normal.  Cardiovascular:     Rate and Rhythm: Normal rate and regular rhythm.     Heart sounds: Normal heart sounds.  Pulmonary:     Effort: Pulmonary effort is normal.     Breath sounds: Normal breath sounds. No stridor.  Abdominal:     General: There is no distension.     Palpations: Abdomen is soft.     Tenderness: There is no abdominal tenderness.     Comments: The abdomen is soft and nontender to palpation.  There is no palpable mass.  Musculoskeletal:        General: Normal range of motion.     Cervical back: Normal range  of motion and neck supple.  Skin:    General: Skin is warm and dry.  Neurological:     Mental Status: She is alert and oriented to person, place, and time.     Cranial Nerves: No cranial nerve deficit.      Sensory: No sensory deficit.     Motor: No abnormal muscle tone.     Coordination: Coordination normal.  Psychiatric:        Behavior: Behavior normal.        Thought Content: Thought content normal.        Judgment: Judgment normal.     Comments: Anxious     ED Results / Procedures / Treatments   Labs (all labs ordered are listed, but only abnormal results are displayed) Labs Reviewed  BASIC METABOLIC PANEL - Abnormal; Notable for the following components:      Result Value   Sodium 131 (*)    Potassium 3.3 (*)    Chloride 95 (*)    Glucose, Bld 189 (*)    BUN 25 (*)    All other components within normal limits  CBC - Abnormal; Notable for the following components:   RBC 3.66 (*)    Hemoglobin 9.9 (*)    HCT 31.0 (*)    All other components within normal limits  I-STAT BETA HCG BLOOD, ED ([facility], [facility], [facility] ONLY)  TROPONIN I (HIGH SENSITIVITY)  TROPONIN I (HIGH SENSITIVITY)    EKG EKG Interpretation  Date/Time:  Sunday March 13 2019 12:38:14 EST Ventricular Rate:  101 PR Interval:    QRS Duration: 81 QT Interval:  334 QTC Calculation: 433 R Axis:   81 Text Interpretation: Sinus tachycardia Probable left atrial enlargement Borderline T wave abnormalities Baseline wander in lead(s) V3 since last tracing no significant change Confirmed by Daleen Bo [phone]) on 03/13/2019 3:40:41 PM   Radiology DG Chest 2 View  Result Date: 03/13/2019 CLINICAL DATA:  Nausea, vomiting, chest pain, SOB, cough EXAM: CHEST - 2 VIEW COMPARISON:  Chest radiograph 12/23/2018, 11/24/2018 FINDINGS: The heart size and mediastinal contours are within normal limits. The lungs are clear. No pneumothorax or pleural effusion. The visualized skeletal structures are unremarkable. IMPRESSION: No evidence of active disease in the chest. Electronically Signed   By: Audie Pinto M.D.   On: 03/13/2019 13:40   DG Abd Acute W/Chest  Result Date: 03/14/2019 CLINICAL DATA:  Abdominal pain vomiting.  EXAM: DG ABDOMEN ACUTE W/ 1V CHEST COMPARISON:  12/23/2018 FINDINGS: Cardiomediastinal contours are normal. Lungs are clear. No free air beneath either right or left hemidiaphragm. Bowel gas pattern is remarkable only for relative lack of visualized small bowel gas. Signs of cholecystectomy. No acute bone finding. IMPRESSION: 1. No active cardiopulmonary disease. 2. Negative abdominal radiographs. No free air or bowel obstruction. Electronically Signed   By: Zetta Bills M.D.   On: 03/14/2019 09:46    Procedures Procedures (including critical care time)  Medications Ordered in ED Medications  sodium chloride 0.9 % bolus 1,000 mL (0 mLs Intravenous Stopped 03/13/19 1813)  haloperidol lactate (HALDOL) injection 2 mg (2 mg Intravenous Given 03/13/19 1621)  metoCLOPramide (REGLAN) injection 10 mg (10 mg Intravenous Given 03/13/19 1619)    ED Course  I have reviewed the triage vital signs and the nursing notes.  Pertinent labs & imaging results that were available during my care of the patient were reviewed by me and considered in my medical decision making (see chart for details).  Clinical Course as of Dec  [place] Mar 14, 2019  1310 Normal  I-Stat beta hCG blood, ED [EW]  1311 Normal except sodium low, potassium low, chloride low, glucose high, BUN high  Basic metabolic panel(!) [EW]  9784 Normal  Troponin I (High Sensitivity) [EW]  1311 Normal except hemoglobin low  CBC(!) [EW]  1312 No infiltrate, CHF or air under the diaphragms, interpreted by me  DG Chest 2 View [EW]    Clinical Course User Index [EW] Daleen Bo, MD   MDM Rules/Calculators/A&P     CHA2DS2/VAS Stroke Risk Points      N/A >= 2 Points: High Risk  1 - 1.99 Points: Medium Risk  0 Points: Low Risk    A final score could not be computed because of missing components.: Last  Change: N/A     This score determines the patient's risk of having a stroke if the  patient has atrial fibrillation.      This  score is not applicable to this patient. Components are not  calculated.                    Patient Vitals for the past 24 hrs:  BP Pulse Resp SpO2  03/13/19 1700 (!) 166/102 (!) 119 12 99 %  03/13/19 1600 (!) 138/103 (!) 105 20 99 %      Medical Decision Making: Patient with recurrent nausea and vomiting associated with abdominal pain.  She states that she has a diagnosis of gastroparesis.  There is no recent  gastric emptying study.  She does not see gastroenterology regularly.  She is a frequent emergency department utilizer.  Her visits frequently have an overlay of pain presentation.  The patient is clearly anxious, during evaluation today.  Documentation by her gastroenterologist in April 2020, indicates that she has previously received benefit from erythromycin and Ativan during periods of discomfort with nausea, vomiting and pain.  She has various GI complaints that have been treated symptomatically.  Her situation is compromised by ongoing use of marijuana.  Patient chose not to stay for ongoing management of her symptoms, and left AGAINST MEDICAL ADVICE.  CRITICAL CARE- No Performed by: Daleen Bo  Nursing Notes Reviewed/ Care Coordinated Applicable Imaging Reviewed Interpretation of Laboratory Data incorporated into ED treatment  [place] prior to completion of treatment.  Final Clinical Impression(s) / ED Diagnoses Final diagnoses:  Generalized abdominal pain  Nausea and vomiting, intractability of vomiting not specified, unspecified vomiting type    Rx / DC Orders ED Discharge Orders    None       Daleen Bo, MD 03/14/19 1315

## 2019-03-13 NOTE — ED Notes (Signed)
IV access lost, Charge RN has been made aware and will attempt IV ultrasound as soon as possible.

## 2019-03-13 NOTE — ED Triage Notes (Signed)
Patient c/o N/V, upper back pain, and intermittent mid chest pain since yesterday. Patient was seen in the ED yesterday.

## 2019-03-13 NOTE — ED Notes (Signed)
Patient is requesting to leave. ED Provider made aware. Patient is leaving AMA.

## 2019-03-14 ENCOUNTER — Encounter (HOSPITAL_COMMUNITY): Payer: Self-pay | Admitting: Emergency Medicine

## 2019-03-14 ENCOUNTER — Emergency Department (HOSPITAL_COMMUNITY)
Admission: EM | Admit: 2019-03-14 | Discharge: 2019-03-14 | Disposition: A | Discharge status: Home / Self Care | Payer: 59 | Attending: Emergency Medicine | Admitting: Emergency Medicine

## 2019-03-14 ENCOUNTER — Other Ambulatory Visit: Payer: Self-pay

## 2019-03-14 ENCOUNTER — Telehealth: Payer: Self-pay | Admitting: Family Medicine

## 2019-03-14 ENCOUNTER — Emergency Department (HOSPITAL_COMMUNITY): Payer: 59

## 2019-03-14 DIAGNOSIS — I10 Essential (primary) hypertension: Secondary | ICD-10-CM | POA: Diagnosis not present

## 2019-03-14 DIAGNOSIS — R112 Nausea with vomiting, unspecified: Secondary | ICD-10-CM | POA: Diagnosis not present

## 2019-03-14 DIAGNOSIS — E109 Type 1 diabetes mellitus without complications: Secondary | ICD-10-CM | POA: Diagnosis not present

## 2019-03-14 DIAGNOSIS — Z794 Long term (current) use of insulin: Secondary | ICD-10-CM | POA: Diagnosis not present

## 2019-03-14 DIAGNOSIS — Z79899 Other long term (current) drug therapy: Secondary | ICD-10-CM | POA: Diagnosis not present

## 2019-03-14 DIAGNOSIS — E876 Hypokalemia: Secondary | ICD-10-CM | POA: Diagnosis not present

## 2019-03-14 DIAGNOSIS — R0789 Other chest pain: Secondary | ICD-10-CM | POA: Diagnosis not present

## 2019-03-14 LAB — COMPREHENSIVE METABOLIC PANEL
ALT: 37 U/L (ref 0–44)
AST: 32 U/L (ref 15–41)
Albumin: 3.6 g/dL (ref 3.5–5.0)
Alkaline Phosphatase: 78 U/L (ref 38–126)
Anion gap: 14 (ref 5–15)
BUN: 21 mg/dL — ABNORMAL HIGH (ref 6–20)
CO2: 28 mmol/L (ref 22–32)
Calcium: 8.7 mg/dL — ABNORMAL LOW (ref 8.9–10.3)
Chloride: 95 mmol/L — ABNORMAL LOW (ref 98–111)
Creatinine, Ser: 1.25 mg/dL — ABNORMAL HIGH (ref 0.44–1.00)
GFR calc Af Amer: >60 mL/min (ref >60)
GFR calc non Af Amer: 58 mL/min — ABNORMAL LOW (ref >60)
Glucose, Bld: 288 mg/dL — ABNORMAL HIGH (ref 70–99)
Potassium: 2.9 mmol/L — ABNORMAL LOW (ref 3.5–5.1)
Sodium: 137 mmol/L (ref 135–145)
Total Bilirubin: 1.1 mg/dL (ref 0.3–1.2)
Total Protein: 7 g/dL (ref 6.5–8.1)

## 2019-03-14 LAB — CBC WITH DIFFERENTIAL/PLATELET
Abs Immature Granulocytes: 0.02 10*3/uL (ref 0.00–0.07)
Basophils Absolute: 0 10*3/uL (ref 0.0–0.1)
Basophils Relative: 1 %
Eosinophils Absolute: 0 10*3/uL (ref 0.0–0.5)
Eosinophils Relative: 0 %
HCT: 29.5 % — ABNORMAL LOW (ref 36.0–46.0)
Hemoglobin: 9.7 g/dL — ABNORMAL LOW (ref 12.0–15.0)
Immature Granulocytes: 1 %
Lymphocytes Relative: 29 %
Lymphs Abs: 1.1 10*3/uL (ref 0.7–4.0)
MCH: 27.6 pg (ref 26.0–34.0)
MCHC: 32.9 g/dL (ref 30.0–36.0)
MCV: 84 fL (ref 80.0–100.0)
Monocytes Absolute: 0.2 10*3/uL (ref 0.1–1.0)
Monocytes Relative: 4 %
Neutro Abs: 2.6 10*3/uL (ref 1.7–7.7)
Neutrophils Relative %: 65 %
Platelets: 225 10*3/uL (ref 150–400)
RBC: 3.51 MIL/uL — ABNORMAL LOW (ref 3.87–5.11)
RDW: 11.9 % (ref 11.5–15.5)
WBC: 4 10*3/uL (ref 4.0–10.5)
nRBC: 0 % (ref 0.0–0.2)

## 2019-03-14 LAB — I-STAT BETA HCG BLOOD, ED (MC, WL, AP ONLY): I-stat hCG, quantitative: <5 m[IU]/mL (ref <5)

## 2019-03-14 LAB — LIPASE, BLOOD: Lipase: 21 U/L (ref 11–51)

## 2019-03-14 LAB — CBG MONITORING, ED
Glucose-Capillary: 260 mg/dL — ABNORMAL HIGH (ref 70–99)
Glucose-Capillary: 262 mg/dL — ABNORMAL HIGH (ref 70–99)
Glucose-Capillary: 202 mg/dL — ABNORMAL HIGH (ref 70–99)

## 2019-03-14 LAB — TROPONIN I (HIGH SENSITIVITY): Troponin I (High Sensitivity): 3 ng/L (ref <18)

## 2019-03-14 IMAGING — DX DG ABDOMEN ACUTE W/ 1V CHEST
4 series · 4 of 4 positions shown · non-contrast
Comparison: [DATE]

CLINICAL DATA: Abdominal pain vomiting.

EXAM:
DG ABDOMEN ACUTE W/ 1V CHEST

[chest pa]
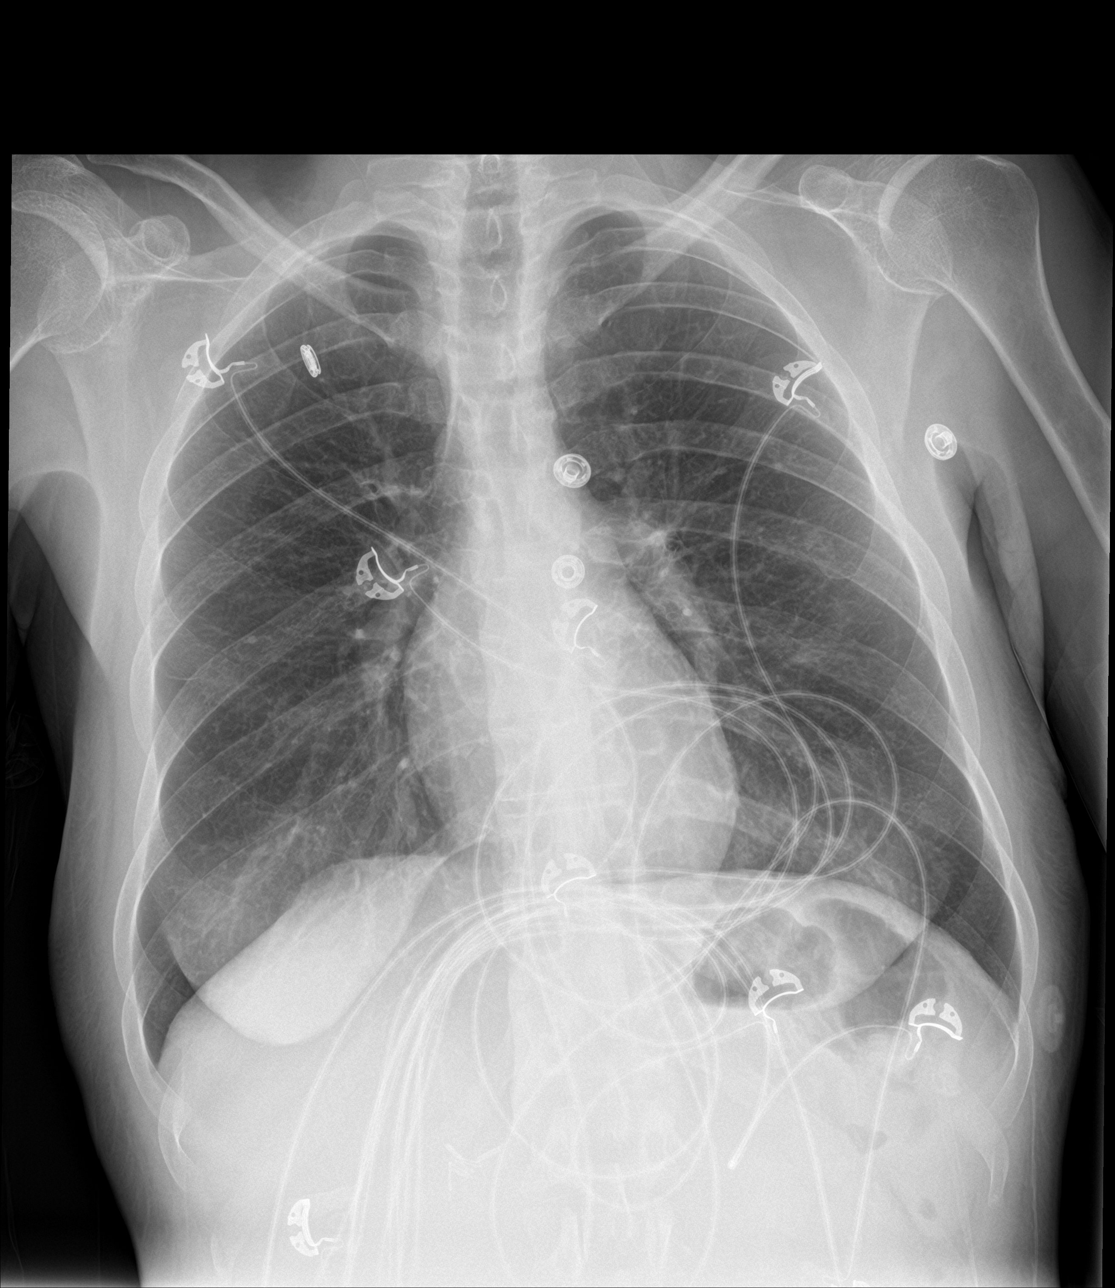

[abdomen erect]
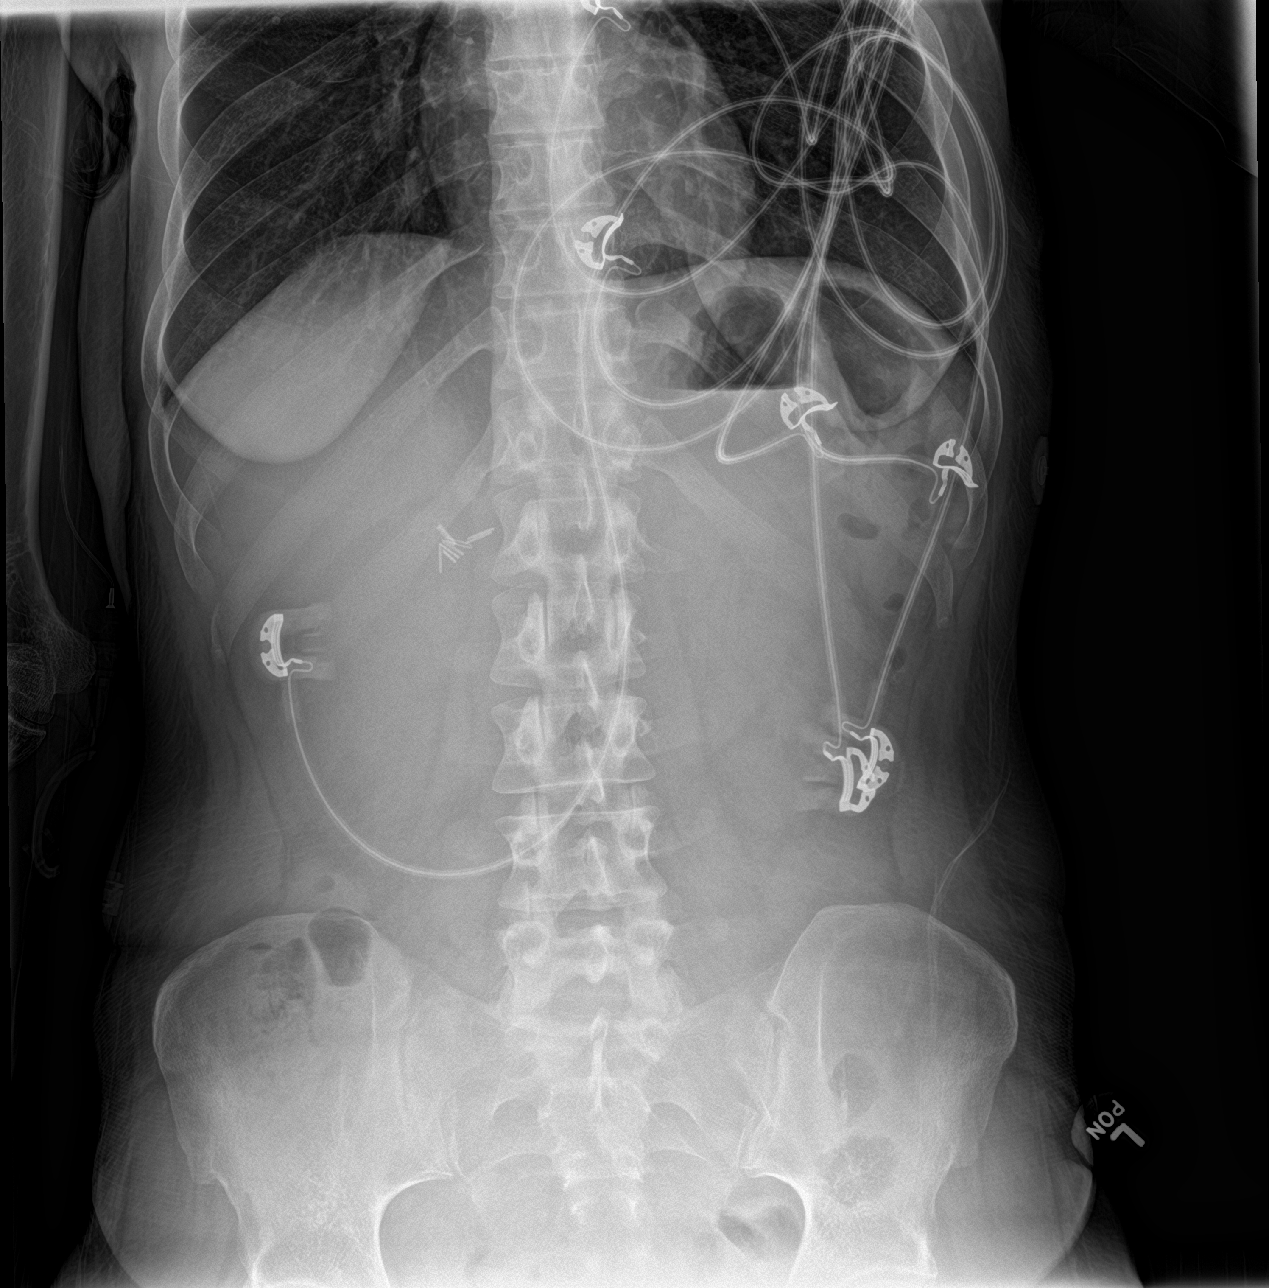

[abdomen supine (1 of 2)]
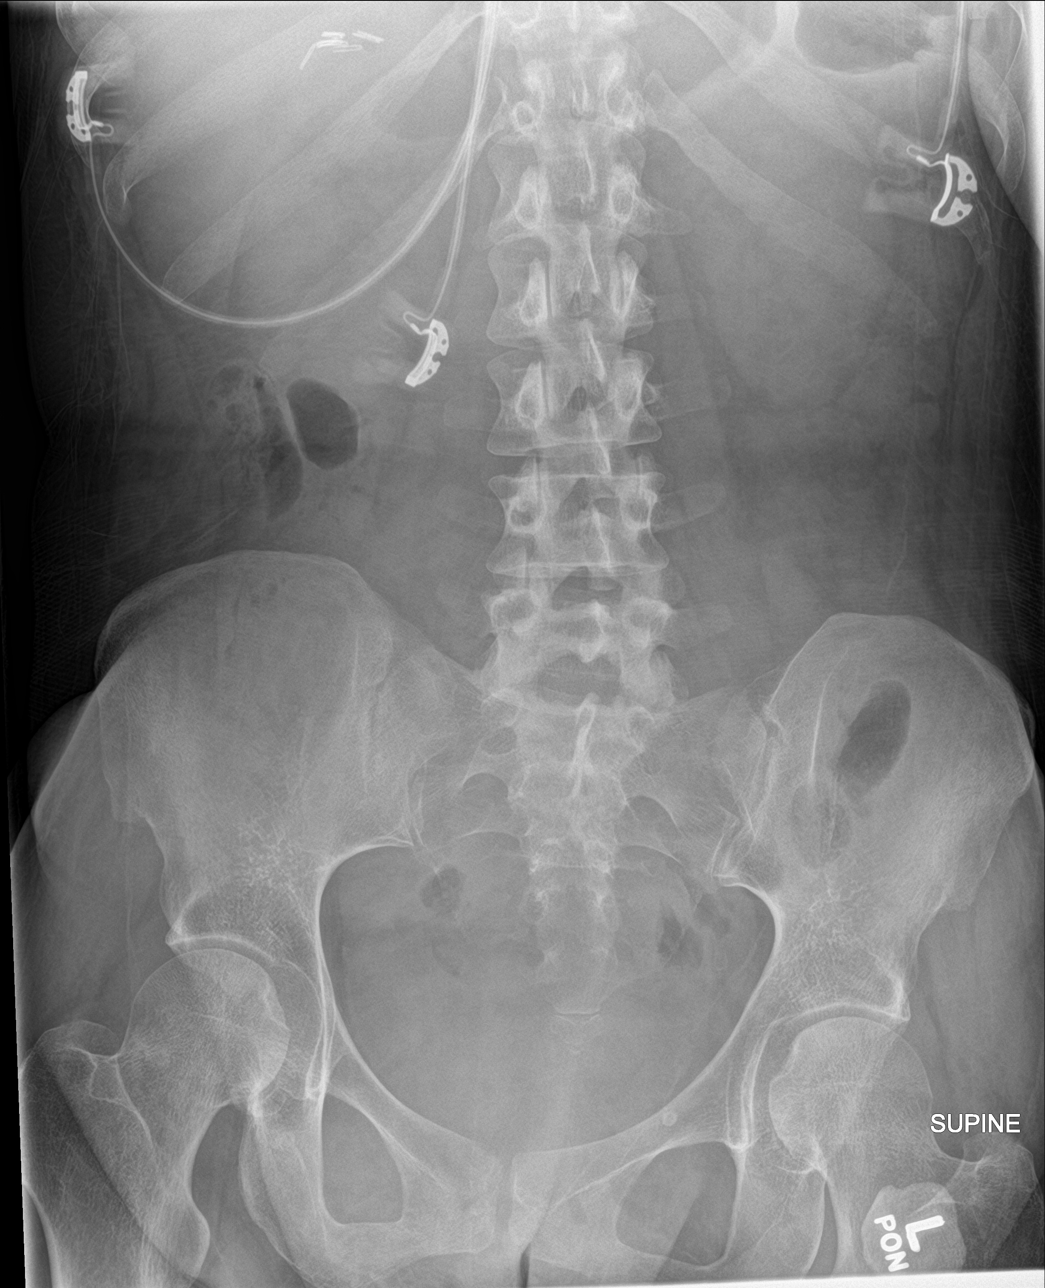

[abdomen supine (2 of 2)]
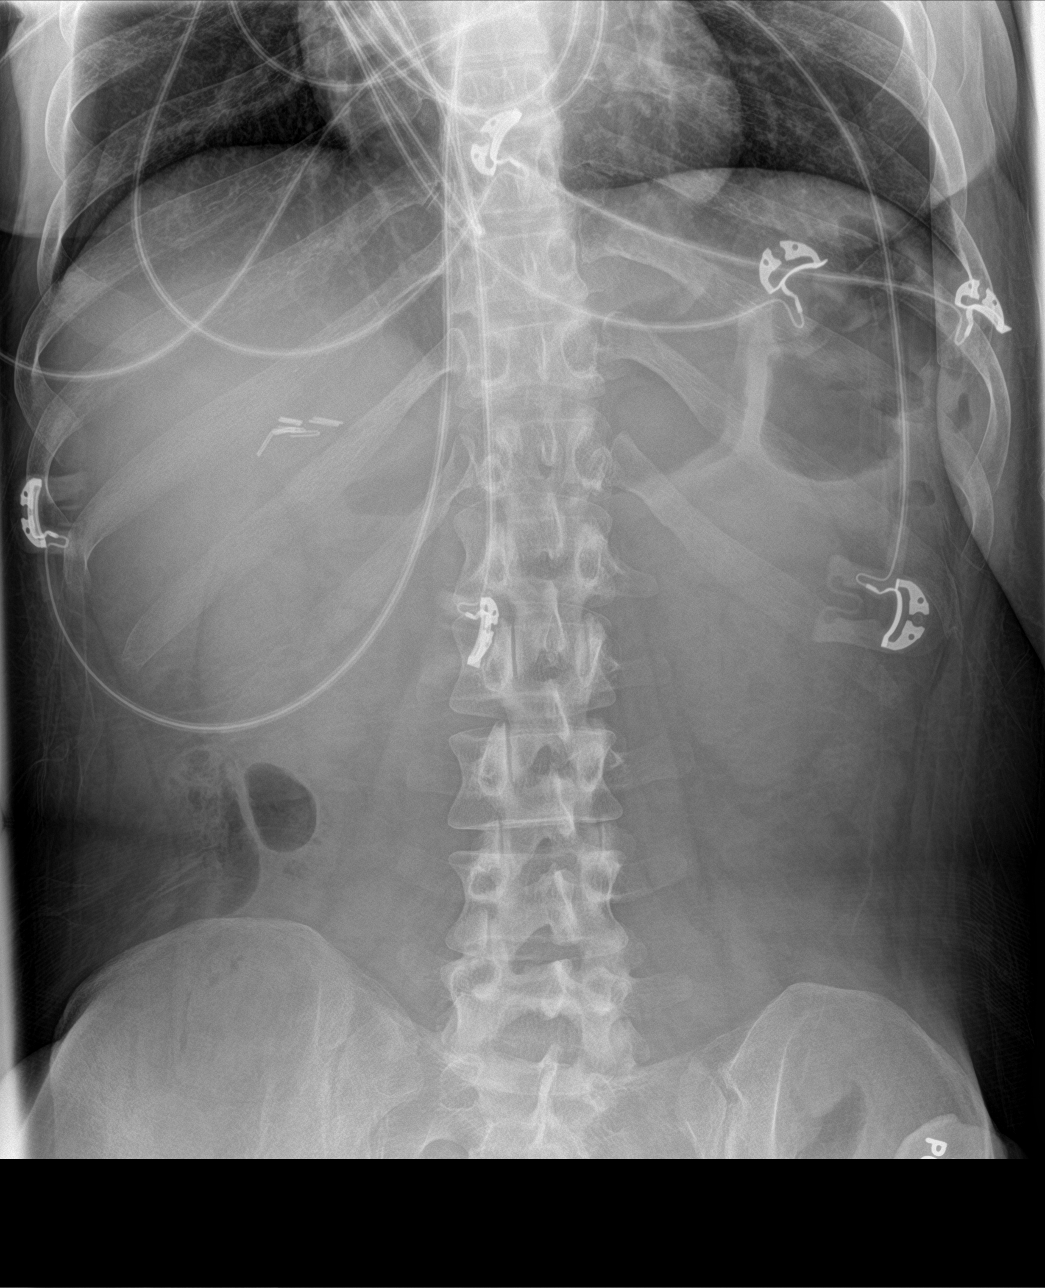

[4 of 4 positions shown; findings below may reference images not displayed]

FINDINGS: Cardiomediastinal contours are normal.

Lungs are clear.

No free air beneath either right or left hemidiaphragm.

Bowel gas pattern is remarkable only for relative lack of visualized
small bowel gas.

Signs of cholecystectomy.

No acute bone finding.
IMPRESSION: 1. No active cardiopulmonary disease.
2. Negative abdominal radiographs. No free air or bowel obstruction.

## 2019-03-14 MED ORDER — MORPHINE SULFATE (PF) 4 MG/ML IV SOLN
4.0000 mg | Freq: Once | INTRAVENOUS | Status: AC
  Administered 2019-03-14: 4 mg via INTRAVENOUS
  Filled 2019-03-14: qty 1

## 2019-03-14 MED ORDER — PANTOPRAZOLE SODIUM 40 MG IV SOLR
40.0000 mg | Freq: Once | INTRAVENOUS | Status: AC
  Administered 2019-03-14: 40 mg via INTRAVENOUS
  Filled 2019-03-14: qty 40

## 2019-03-14 MED ORDER — PROMETHAZINE HCL 25 MG/ML IJ SOLN
25.0000 mg | Freq: Once | INTRAMUSCULAR | Status: AC
  Administered 2019-03-14: 25 mg via INTRAVENOUS
  Filled 2019-03-14: qty 1

## 2019-03-14 MED ORDER — HALOPERIDOL LACTATE 5 MG/ML IJ SOLN
5.0000 mg | Freq: Once | INTRAMUSCULAR | Status: AC
  Administered 2019-03-14: 5 mg via INTRAVENOUS
  Filled 2019-03-14: qty 1

## 2019-03-14 MED ORDER — SODIUM CHLORIDE 0.9 % IV BOLUS
1000.0000 mL | Freq: Once | INTRAVENOUS | Status: AC
  Administered 2019-03-14: 1000 mL via INTRAVENOUS

## 2019-03-14 MED ORDER — INSULIN ASPART 100 UNIT/ML ~~LOC~~ SOLN
2.0000 [IU] | Freq: Once | SUBCUTANEOUS | Status: AC
  Administered 2019-03-14: 2 [IU] via SUBCUTANEOUS

## 2019-03-14 MED ORDER — POTASSIUM CHLORIDE CRYS ER 20 MEQ PO TBCR
40.0000 meq | EXTENDED_RELEASE_TABLET | Freq: Once | ORAL | Status: DC
  Filled 2019-03-14: qty 2

## 2019-03-14 MED ORDER — POTASSIUM CHLORIDE CRYS ER 20 MEQ PO TBCR: 20.0000 meq | EXTENDED_RELEASE_TABLET | Freq: Every day | ORAL | 0 refills | Status: DC

## 2019-03-14 MED ORDER — PROMETHAZINE HCL 25 MG PO TABS: 25.0000 mg | ORAL_TABLET | Freq: Four times a day (QID) | ORAL | 0 refills | Status: DC | PRN

## 2019-03-14 MED ORDER — POTASSIUM CHLORIDE 10 MEQ/100ML IV SOLN
10.0000 meq | Freq: Once | INTRAVENOUS | Status: AC
  Administered 2019-03-14: 10 meq via INTRAVENOUS
  Filled 2019-03-14: qty 100

## 2019-03-14 MED ORDER — ONDANSETRON HCL 4 MG/2ML IJ SOLN
4.0000 mg | Freq: Once | INTRAMUSCULAR | Status: AC
  Administered 2019-03-14: 4 mg via INTRAVENOUS
  Filled 2019-03-14: qty 2

## 2019-03-14 NOTE — Discharge Instructions (Addendum)
Take phenergan for nausea.   Stay hydrated   Take potassium daily. Repeat potassium level in a week with your doctor   See your doctor and GI doctor   Return to ER if you have worse abdominal pain, vomiting, fever.

## 2019-03-14 NOTE — Telephone Encounter (Signed)
Rx request 

## 2019-03-14 NOTE — Telephone Encounter (Signed)
rx refill dicyclomine (BENTYL) 20 MG  [facility]Y [id number] [address], [address] AT [place] Phone:  [phone]

## 2019-03-14 NOTE — ED Triage Notes (Signed)
Reports nausea and vomiting with severe upper mid back pain x 4 days.  Denies injury.  Additionally endorses chest pain and shortness of breath.  Denies dysuria, hematuria, fevers, chills.

## 2019-03-14 NOTE — ED Notes (Signed)
Pt asking for pain meds. Informed Dr. Darl Householder.

## 2019-03-14 NOTE — ED Notes (Signed)
Patient verbalizes understanding of discharge instructions. Opportunity for questioning and answers were provided. Armband removed by staff, pt discharged from ED.  

## 2019-03-14 NOTE — ED Provider Notes (Signed)
[facility] EMERGENCY DEPARTMENT Provider Note   CSN: [id number] Arrival date & time: 03/14/19  0820     History Chief Complaint  Patient presents with  . Emesis  . Back Pain    [name] is a 28 y.o. 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  Patient has persistent symptoms for the last several days.  Patient did use some marijuana 3 days ago and subsequently came to the ED.  Patient was given antinausea medicine and eventually felt better and went home.  Patient also came in yesterday and since she was a difficult access, she eventually left and signed out [place] .  Patient states that she had persistent nausea vomiting and chest pain that is burning sensation.  She has persistent back pain but denies any fevers.   The history is provided by the patient.       Past Medical History:  Diagnosis Date  . Allergy   . Anemia   . Anxiety   . Blood transfusion without reported diagnosis    Dec 2018  . Cataract    right eye  . Depression   . Diabetes type 1, uncontrolled ([place]) 11/14/2011   Since age 8  . Fibromyalgia   . Gastroparesis   . GERD (gastroesophageal reflux disease)   . Hypertension   . Infection    UTI April 2016    Patient Active Problem List   Diagnosis Date Noted  . Contraception management 08/06/2018  . Cyclical vomiting syndrome 06/07/2018  . Diabetic gastroparesis associated with type 1 diabetes mellitus ([place]) 06/04/2018  . Anxiety 05/31/2018  . Peripheral edema 05/31/2018  . Normocytic anemia 07/10/2017  . GERD (gastroesophageal reflux disease) 06/23/2017  . MDD (major depressive disorder), recurrent episode, mild ([place]) 02/28/2017  . Diabetes mellitus type 1 ([place]) 04/28/2013  . Protein-calorie malnutrition, severe ([place]) 04/28/2013  . Hypertension associated with diabetes ([place]) 11/14/2011  . Chronic abdominal pain 09/18/2011    Past Surgical History:   Procedure Laterality Date  . ANKLE SURGERY    . CHOLECYSTECTOMY  11/15/2011   Procedure: LAPAROSCOPIC CHOLECYSTECTOMY WITH INTRAOPERATIVE CHOLANGIOGRAM;  Surgeon: [name], MD;  Location: [facility];  Service: General;  Laterality: N/A;  . COLONOSCOPY    . COLONOSCOPY WITH PROPOFOL N/A 06/27/2017   Procedure: COLONOSCOPY WITH PROPOFOL;  Surgeon: Milus Banister, MD;  Location: [facility];  Service: Endoscopy;  Laterality: N/A;  . ESOPHAGOGASTRODUODENOSCOPY  12/03/2011   Procedure: ESOPHAGOGASTRODUODENOSCOPY (EGD);  Surgeon: Beryle Beams, MD;  Location: [facility];  Service: Endoscopy;  Laterality: N/A;  . FLEXIBLE SIGMOIDOSCOPY N/A 03/10/2017   Procedure: FLEXIBLE SIGMOIDOSCOPY;  Surgeon: Carol Ada, MD;  Location: [facility];  Service: Endoscopy;  Laterality: N/A;  . INCISION AND DRAINAGE PERIRECTAL ABSCESS N/A 03/01/2017   Procedure: IRRIGATION AND DEBRIDEMENT PERIRECTAL ABSCESS;  Surgeon: Alphonsa Overall, MD;  Location: [facility];  Service: General;  Laterality: N/A;  . IRRIGATION AND DEBRIDEMENT BUTTOCKS N/A 03/23/2017   Procedure: IRRIGATION AND DEBRIDEMENT BUTTOCKS, SETON PLACEMENT;  Surgeon: [name], MD;  Location: [facility];  Service: General;  Laterality: N/A;  . LAPAROSCOPY  11/23/2011   Procedure: LAPAROSCOPY DIAGNOSTIC;  Surgeon: Edward Jolly, MD;  Location: [facility];  Service: General;  Laterality: N/A;  . SIGMOIDOSCOPY    . UPPER GASTROINTESTINAL ENDOSCOPY    . WISDOM TOOTH EXTRACTION       OB History    Gravida  1  Para  1   Term      Preterm  1   AB      Living  1     SAB      TAB      Ectopic      Multiple  0   Live Births  1           Family History  Problem Relation Age of Onset  . Diabetes Mother   . Hypertension Father   . Colon cancer Paternal Grandmother        pt thinks PGM was dx in her 24's  . Diabetes Paternal Grandmother   . Diabetes Maternal Grandmother   . Diabetes Maternal Grandfather   . Diabetes Paternal  Grandfather   . Diabetes Other   . Esophageal cancer Neg Hx   . Liver cancer Neg Hx   . Pancreatic cancer Neg Hx   . Stomach cancer Neg Hx   . Rectal cancer Neg Hx     Social History   Tobacco Use  . Smoking status: Never Smoker  . Smokeless tobacco: Never Used  Substance Use Topics  . Alcohol use: No    Alcohol/week: 0.0 standard drinks  . Drug use: Yes    Types: Marijuana    Home Medications Prior to Admission medications   Medication Sig Start Date End Date Taking? Authorizing Provider  Continuous Blood Gluc Receiver (DEXCOM G6 RECEIVER) DEVI USE AS DIRECTED TO MONITOR GLUCOSE LEVELS 10/29/18   [provider]  Continuous Blood Gluc Sensor (DEXCOM G6 SENSOR) MISC 1 each by Does not apply route See admin instructions. every 10 days. 12/31/18   Renato Shin, MD  Continuous Blood Gluc Transmit (DEXCOM G6 TRANSMITTER) MISC USE AS DIRECTED TO MONITOR GLUCOSE LEVELS 10/29/18   [provider]  cyclobenzaprine (FLEXERIL) 10 MG tablet TAKE 1 TABLET(10 MG) BY MOUTH THREE TIMES DAILY AS NEEDED FOR MUSCLE SPASMS Patient taking differently: Take 10 mg by mouth 3 (three) times daily as needed for muscle spasms.  02/21/19   [name], MD  dicyclomine (BENTYL) 20 MG tablet Take 1 tablet (20 mg total) by mouth 3 (three) times daily as needed for spasms. 02/18/19   Long, Wonda Olds, MD  furosemide (LASIX) 40 MG tablet TAKE 1 TABLET BY MOUTH  DAILY Patient taking differently: Take 40 mg by mouth daily.  11/11/18   Armbruster, Carlota Raspberry, MD  hydrALAZINE (APRESOLINE) 50 MG tablet Take 1 tablet (50 mg total) by mouth every 6 (six) hours. 10/23/18   Swayze, Ava, DO  Insulin Glargine (LANTUS SOLOSTAR) 100 UNIT/ML Solostar Pen Inject 10 Units into the skin at bedtime. And pen needles 4/day Patient taking differently: Inject 12 Units into the skin at bedtime. And pen needles 4/day 12/31/18   Renato Shin, MD  insulin lispro (HUMALOG KWIKPEN) 100 UNIT/ML KwikPen Inject 0.06 mLs (6 Units  total) into the skin 3 (three) times daily. Patient taking differently: Inject 5 Units into the skin 3 (three) times daily.  12/31/18   Renato Shin, MD  LORazepam (ATIVAN) 0.5 MG tablet TAKE 1 TABLET BY MOUTH TWICE DAILY AS NEEDED FOR ANXIETY Patient taking differently: Take 0.5 mg by mouth 2 (two) times daily as needed for anxiety.  02/10/19   [name], MD  metoCLOPramide (REGLAN) 10 MG tablet Take 1 tablet (10 mg total) by mouth every 6 (six) hours as needed for nausea or vomiting. 02/20/18   Shelly Coss, MD  metoprolol tartrate (LOPRESSOR) 25 MG tablet Take  1 tablet (25 mg total) by mouth 2 (two) times daily. 10/23/18   Swayze, Ava, DO  mirtazapine (REMERON) 30 MG tablet TAKE 1 TABLET BY MOUTH AT  BEDTIME Patient taking differently: Take 30 mg by mouth at bedtime.  02/16/19   [name], MD  ondansetron (ZOFRAN) 4 MG tablet Take 1 tablet (4 mg total) by mouth every 8 (eight) hours as needed for nausea or vomiting (If necessary, dont combine with Reglan.). Patient not taking: Reported on 03/13/2019 09/21/18   Aline August, MD  pantoprazole (PROTONIX) 40 MG tablet TAKE 1 TABLET BY MOUTH  DAILY Patient taking differently: Take 40 mg by mouth daily.  10/22/18   Armbruster, Carlota Raspberry, MD  potassium chloride 20 MEQ TBCR Take 20 mEq by mouth 2 (two) times daily. Patient not taking: Reported on 03/13/2019 10/17/18   Isla Pence, MD  promethazine (PHENERGAN) 12.5 MG tablet TAKE 1 TABLET BY MOUTH  EVERY 8 HOURS AS NEEDED FOR NAUSEA AND VOMITING Patient taking differently: Take 12.5 mg by mouth every 8 (eight) hours as needed for nausea or vomiting.  02/15/19   Armbruster, Carlota Raspberry, MD  promethazine (PHENERGAN) 25 MG suppository Place 1 suppository (25 mg total) rectally every 8 (eight) hours as needed for nausea or vomiting. Patient not taking: Reported on 03/13/2019 12/17/18   Armbruster, Carlota Raspberry, MD  traMADol (ULTRAM) 50 MG tablet TAKE 1 TABLET(50 MG) BY MOUTH EVERY 6 HOURS AS NEEDED  FOR MODERATE PAIN OR SEVERE PAIN Patient taking differently: Take 50 mg by mouth every 6 (six) hours as needed for moderate pain or severe pain.  02/10/19   [name], MD    Allergies    Other and Lactose intolerance (gi)  Review of Systems   Review of Systems  Gastrointestinal: Positive for vomiting.  Musculoskeletal: Positive for back pain.  All other systems reviewed and are negative.   Physical Exam Updated Vital Signs BP (!) 191/119 (BP Location: Right Arm)   Pulse (!) 123   Temp 98.4 F (36.9 C) (Oral)   Resp 20   LMP 02/28/2019 (Approximate)   SpO2 100%   Physical Exam Vitals and nursing note reviewed.  Constitutional:      Comments: Uncomfortable, tearful, crying   HENT:     Head: Normocephalic.     Nose: Nose normal.     Mouth/Throat:     Mouth: Mucous membranes are dry.  Eyes:     Extraocular Movements: Extraocular movements intact.     Pupils: Pupils are equal, round, and reactive to light.  Cardiovascular:     Rate and Rhythm: Normal rate and regular rhythm.     Pulses: Normal pulses.     Heart sounds: Normal heart sounds.  Pulmonary:     Effort: Pulmonary effort is normal.     Breath sounds: Normal breath sounds.  Abdominal:     General: Abdomen is flat.     Comments: Mild epigastric tenderness, no rebound   Musculoskeletal:        General: Normal range of motion.     Cervical back: Normal range of motion.  Skin:    General: Skin is warm.     Capillary Refill: Capillary refill takes less than 2 seconds.  Neurological:     General: No focal deficit present.     Mental Status: She is oriented to person, place, and time.  Psychiatric:        Mood and Affect: Mood normal.     ED Results /  Procedures / Treatments   Labs (all labs ordered are listed, but only abnormal results are displayed) Labs Reviewed  CBG MONITORING, ED - Abnormal; Notable for the following components:      Result Value   Glucose-Capillary 260 (*)    All other  components within normal limits  CBC WITH DIFFERENTIAL/PLATELET  COMPREHENSIVE METABOLIC PANEL  LIPASE, BLOOD  URINALYSIS, ROUTINE W REFLEX MICROSCOPIC  I-STAT BETA HCG BLOOD, ED ([facility], [facility], [facility] ONLY)  TROPONIN I (HIGH SENSITIVITY)    EKG None  Radiology DG Chest 2 View  Result Date: 03/13/2019 CLINICAL DATA:  Nausea, vomiting, chest pain, SOB, cough EXAM: CHEST - 2 VIEW COMPARISON:  Chest radiograph 12/23/2018, 11/24/2018 FINDINGS: The heart size and mediastinal contours are within normal limits. The lungs are clear. No pneumothorax or pleural effusion. The visualized skeletal structures are unremarkable. IMPRESSION: No evidence of active disease in the chest. Electronically Signed   By: Audie Pinto M.D.   On: 03/13/2019 13:40    Procedures Procedures (including critical care time)  Angiocath insertion Performed by: Wandra Arthurs  Consent: Verbal consent obtained. Risks and benefits: risks, benefits and alternatives were discussed Time out: Immediately prior to procedure a "time out" was called to verify the correct patient, procedure, equipment, support staff and site/side marked as required.  Preparation: Patient was prepped and draped in the usual sterile fashion.  Vein Location: R brachial  Ultrasound Guided  Gauge: 20 long   Normal blood return and flush without difficulty Patient tolerance: Patient tolerated the procedure well with no immediate complications.     Medications Ordered in ED Medications  sodium chloride 0.9 % bolus 1,000 mL (has no administration in time range)  pantoprazole (PROTONIX) injection 40 mg (has no administration in time range)  ondansetron (ZOFRAN) injection 4 mg (has no administration in time range)  haloperidol lactate (HALDOL) injection 5 mg (has no administration in time range)  promethazine (PHENERGAN) injection 25 mg (has no administration in time range)  morphine 4 MG/ML injection 4 mg (has no administration in time range)     ED Course  I have reviewed the triage vital signs and the nursing notes.  Pertinent labs & imaging results that were available during my care of the patient were reviewed by me and considered in my medical decision making (see chart for details).    MDM Rules/Calculators/A&P                   RAKEYA GLAB is a 28 y.o. female who presented with epigastric pain and vomiting.  This is a chronic problem and patient has gastroparesis secondary to diabetes as well as marijuana use.  Patient came in yesterday but lost IV access of left.  Was able to place IV ultrasound and we will check some labs as well as give antiemetics.  3:28 PM CBG was 260 then dec to 200 after IVF and insulin. No vomiting after antiemetics. Labs showed K 2.9, supplemented. Will dc home with phenergan, potassium. Recommend recheck potassium with PCP in a week   Final Clinical Impression(s) / ED Diagnoses Final diagnoses:  None    Rx / DC Orders ED Discharge Orders    None       Drenda Freeze, MD 03/14/19 1528

## 2019-03-14 NOTE — Telephone Encounter (Signed)
See note

## 2019-03-15 ENCOUNTER — Encounter (HOSPITAL_COMMUNITY): Payer: Self-pay | Admitting: Emergency Medicine

## 2019-03-15 ENCOUNTER — Emergency Department (HOSPITAL_COMMUNITY)
Admission: EM | Admit: 2019-03-15 | Discharge: 2019-03-15 | Disposition: A | Discharge status: Home / Self Care | Payer: 59 | Attending: Emergency Medicine | Admitting: Emergency Medicine

## 2019-03-15 ENCOUNTER — Other Ambulatory Visit: Payer: Self-pay

## 2019-03-15 DIAGNOSIS — E1051 Type 1 diabetes mellitus with diabetic peripheral angiopathy without gangrene: Secondary | ICD-10-CM | POA: Insufficient documentation

## 2019-03-15 DIAGNOSIS — Z79899 Other long term (current) drug therapy: Secondary | ICD-10-CM | POA: Diagnosis not present

## 2019-03-15 DIAGNOSIS — Z794 Long term (current) use of insulin: Secondary | ICD-10-CM | POA: Diagnosis not present

## 2019-03-15 DIAGNOSIS — I10 Essential (primary) hypertension: Secondary | ICD-10-CM | POA: Insufficient documentation

## 2019-03-15 DIAGNOSIS — E1043 Type 1 diabetes mellitus with diabetic autonomic (poly)neuropathy: Secondary | ICD-10-CM | POA: Insufficient documentation

## 2019-03-15 DIAGNOSIS — M549 Dorsalgia, unspecified: Secondary | ICD-10-CM | POA: Diagnosis present

## 2019-03-15 DIAGNOSIS — K3184 Gastroparesis: Secondary | ICD-10-CM

## 2019-03-15 LAB — CBC WITH DIFFERENTIAL/PLATELET
Abs Immature Granulocytes: 0.02 10*3/uL (ref 0.00–0.07)
Basophils Absolute: 0 10*3/uL (ref 0.0–0.1)
Basophils Relative: 1 %
Eosinophils Absolute: 0.1 10*3/uL (ref 0.0–0.5)
Eosinophils Relative: 1 %
HCT: 36.3 % (ref 36.0–46.0)
Hemoglobin: 11.8 g/dL — ABNORMAL LOW (ref 12.0–15.0)
Immature Granulocytes: 0 %
Lymphocytes Relative: 39 %
Lymphs Abs: 2.4 10*3/uL (ref 0.7–4.0)
MCH: 27.7 pg (ref 26.0–34.0)
MCHC: 32.5 g/dL (ref 30.0–36.0)
MCV: 85.2 fL (ref 80.0–100.0)
Monocytes Absolute: 0.4 10*3/uL (ref 0.1–1.0)
Monocytes Relative: 6 %
Neutro Abs: 3.3 10*3/uL (ref 1.7–7.7)
Neutrophils Relative %: 53 %
Platelets: 231 10*3/uL (ref 150–400)
RBC: 4.26 MIL/uL (ref 3.87–5.11)
RDW: 11.9 % (ref 11.5–15.5)
WBC: 6.2 10*3/uL (ref 4.0–10.5)
nRBC: 0 % (ref 0.0–0.2)

## 2019-03-15 LAB — COMPREHENSIVE METABOLIC PANEL
ALT: 36 U/L (ref 0–44)
AST: 26 U/L (ref 15–41)
Albumin: 4.1 g/dL (ref 3.5–5.0)
Alkaline Phosphatase: 80 U/L (ref 38–126)
Anion gap: 17 — ABNORMAL HIGH (ref 5–15)
BUN: 16 mg/dL (ref 6–20)
CO2: 25 mmol/L (ref 22–32)
Calcium: 8.9 mg/dL (ref 8.9–10.3)
Chloride: 93 mmol/L — ABNORMAL LOW (ref 98–111)
Creatinine, Ser: 0.86 mg/dL (ref 0.44–1.00)
GFR calc Af Amer: >60 mL/min (ref >60)
GFR calc non Af Amer: >60 mL/min (ref >60)
Glucose, Bld: 162 mg/dL — ABNORMAL HIGH (ref 70–99)
Potassium: 2.8 mmol/L — ABNORMAL LOW (ref 3.5–5.1)
Sodium: 135 mmol/L (ref 135–145)
Total Bilirubin: 1 mg/dL (ref 0.3–1.2)
Total Protein: 8.3 g/dL — ABNORMAL HIGH (ref 6.5–8.1)

## 2019-03-15 MED ORDER — MORPHINE SULFATE (PF) 4 MG/ML IV SOLN
4.0000 mg | Freq: Once | INTRAVENOUS | Status: AC
  Administered 2019-03-15: 4 mg via INTRAVENOUS
  Filled 2019-03-15: qty 1

## 2019-03-15 MED ORDER — FAMOTIDINE IN NACL 20-0.9 MG/50ML-% IV SOLN
20.0000 mg | Freq: Once | INTRAVENOUS | Status: AC
  Administered 2019-03-15: 20 mg via INTRAVENOUS
  Filled 2019-03-15: qty 50

## 2019-03-15 MED ORDER — DROPERIDOL 2.5 MG/ML IJ SOLN
2.5000 mg | Freq: Once | INTRAMUSCULAR | Status: AC
  Administered 2019-03-15: 2.5 mg via INTRAVENOUS
  Filled 2019-03-15: qty 2

## 2019-03-15 MED ORDER — ALUM & MAG HYDROXIDE-SIMETH 200-200-20 MG/5ML PO SUSP
30.0000 mL | Freq: Once | ORAL | Status: AC
  Administered 2019-03-15: 30 mL via ORAL
  Filled 2019-03-15: qty 30

## 2019-03-15 MED ORDER — DIPHENHYDRAMINE HCL 50 MG/ML IJ SOLN
25.0000 mg | Freq: Once | INTRAMUSCULAR | Status: AC
  Administered 2019-03-15: 25 mg via INTRAVENOUS
  Filled 2019-03-15: qty 1

## 2019-03-15 MED ORDER — SODIUM CHLORIDE 0.9 % IV BOLUS
1000.0000 mL | Freq: Once | INTRAVENOUS | Status: AC
  Administered 2019-03-15: 1000 mL via INTRAVENOUS

## 2019-03-15 NOTE — ED Provider Notes (Signed)
[facility] DEPT Provider Note   CSN: [id number] Arrival date & time: 03/15/19  0030     History Chief Complaint  Patient presents with  . Emesis  . Back Pain    [name] is a 28 y.o. female.  28 yo F with a chief complaint of epigastric abdominal pain that radiates to her back nausea and vomiting.  Is a chronic problem for her.  Going on for the past week.  This is her fourth or fifth visit this week for the same.  Was seen yesterday and had mild hypokalemia and hyperglycemia.  Improved with IV fluids and supplementation.  She was able to tolerate p.o. and the plan was for her to follow-up with her doctor in the office.  Patient felt like her pain is worsened and she is continued to vomit.  No fevers.  Feels the same as it typically does.  The history is provided by the patient.  Emesis Severity:  Moderate Duration:  1 week Timing:  Constant Quality:  Stomach contents Progression:  Worsening Chronicity:  Chronic Recent urination:  Normal Relieved by:  Nothing Worsened by:  Nothing Ineffective treatments:  None tried Associated symptoms: no arthralgias, no chills, no fever, no headaches and no myalgias   Back Pain Associated symptoms: no chest pain, no dysuria, no fever and no headaches        Past Medical History:  Diagnosis Date  . Allergy   . Anemia   . Anxiety   . Blood transfusion without reported diagnosis    Dec 2018  . Cataract    right eye  . Depression   . Diabetes type 1, uncontrolled ([place]) 11/14/2011   Since age 82  . Fibromyalgia   . Gastroparesis   . GERD (gastroesophageal reflux disease)   . Hypertension   . Infection    UTI April 2016    Patient Active Problem List   Diagnosis Date Noted  . Contraception management 08/06/2018  . Cyclical vomiting syndrome 06/07/2018  . Diabetic gastroparesis associated with type 1 diabetes mellitus ([place]) 06/04/2018  . Anxiety 05/31/2018  . Peripheral edema 05/31/2018    . Normocytic anemia 07/10/2017  . GERD (gastroesophageal reflux disease) 06/23/2017  . 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 Diabetes mellitus type 1 ([place]) 04/28/2013  . Protein-calorie malnutrition, severe ([place]) 04/28/2013  . Hypertension associated with diabetes ([place]) 11/14/2011  . Chronic abdominal pain 09/18/2011    Past Surgical History:  Procedure Laterality Date  . ANKLE SURGERY    . CHOLECYSTECTOMY  11/15/2011   Procedure: LAPAROSCOPIC CHOLECYSTECTOMY WITH INTRAOPERATIVE CHOLANGIOGRAM;  Surgeon: [name], MD;  Location: [facility];  Service: General;  Laterality: N/A;  . COLONOSCOPY    . COLONOSCOPY WITH PROPOFOL N/A 06/27/2017   Procedure: COLONOSCOPY WITH PROPOFOL;  Surgeon: Milus Banister, MD;  Location: [facility];  Service: Endoscopy;  Laterality: N/A;  . ESOPHAGOGASTRODUODENOSCOPY  12/03/2011   Procedure: ESOPHAGOGASTRODUODENOSCOPY (EGD);  Surgeon: Beryle Beams, MD;  Location: [facility];  Service: Endoscopy;  Laterality: N/A;  . FLEXIBLE SIGMOIDOSCOPY N/A 03/10/2017   Procedure: FLEXIBLE SIGMOIDOSCOPY;  Surgeon: Carol Ada, MD;  Location: [facility];  Service: Endoscopy;  Laterality: N/A;  . INCISION AND DRAINAGE PERIRECTAL ABSCESS N/A 03/01/2017   Procedure: IRRIGATION AND DEBRIDEMENT PERIRECTAL ABSCESS;  Surgeon: Alphonsa Overall, MD;  Location: [facility];  Service: General;  Laterality: N/A;  . IRRIGATION AND DEBRIDEMENT BUTTOCKS N/A 03/23/2017   Procedure: IRRIGATION AND DEBRIDEMENT BUTTOCKS, SETON  PLACEMENT;  Surgeon: [name], MD;  Location: [facility];  Service: General;  Laterality: N/A;  . LAPAROSCOPY  11/23/2011   Procedure: LAPAROSCOPY DIAGNOSTIC;  Surgeon: Edward Jolly, MD;  Location: [facility];  Service: General;  Laterality: N/A;  . SIGMOIDOSCOPY    . UPPER GASTROINTESTINAL ENDOSCOPY    . WISDOM TOOTH EXTRACTION       OB History    Gravida  1   Para  1   Term      Preterm  1   AB      Living  1      SAB      TAB      Ectopic      Multiple  0   Live Births  1           Family History  Problem Relation Age of Onset  . Diabetes Mother   . Hypertension Father   . Colon cancer Paternal Grandmother        pt thinks PGM was dx in her 83's  . Diabetes Paternal Grandmother   . Diabetes Maternal Grandmother   . Diabetes Maternal Grandfather   . Diabetes Paternal Grandfather   . Diabetes Other   . Esophageal cancer Neg Hx   . Liver cancer Neg Hx   . Pancreatic cancer Neg Hx   . Stomach cancer Neg Hx   . Rectal cancer Neg Hx     Social History   Tobacco Use  . Smoking status: Never Smoker  . Smokeless tobacco: Never Used  Substance Use Topics  . Alcohol use: No    Alcohol/week: 0.0 standard drinks  . Drug use: Yes    Types: Marijuana    Home Medications Prior to Admission medications   Medication Sig Start Date End Date Taking? Authorizing Provider  Continuous Blood Gluc Receiver (DEXCOM G6 RECEIVER) DEVI USE AS DIRECTED TO MONITOR GLUCOSE LEVELS 10/29/18  Yes [provider]  Continuous Blood Gluc Sensor (DEXCOM G6 SENSOR) MISC 1 each by Does not apply route See admin instructions. every 10 days. 12/31/18  Yes Renato Shin, MD  Continuous Blood Gluc Transmit (DEXCOM G6 TRANSMITTER) MISC USE AS DIRECTED TO MONITOR GLUCOSE LEVELS 10/29/18  Yes [provider]  cyclobenzaprine (FLEXERIL) 10 MG tablet TAKE 1 TABLET(10 MG) BY MOUTH THREE TIMES DAILY AS NEEDED FOR MUSCLE SPASMS Patient taking differently: Take 10 mg by mouth 3 (three) times daily as needed for muscle spasms.  02/21/19  Yes [name], MD  dicyclomine (BENTYL) 20 MG tablet Take 1 tablet (20 mg total) by mouth 3 (three) times daily as needed for spasms. 02/18/19  Yes Long, Wonda Olds, MD  furosemide (LASIX) 40 MG tablet TAKE 1 TABLET BY MOUTH  DAILY Patient taking differently: Take 40 mg by mouth daily.  11/11/18  Yes Armbruster, Carlota Raspberry, MD  hydrALAZINE (APRESOLINE) 50 MG tablet  Take 1 tablet (50 mg total) by mouth every 6 (six) hours. 10/23/18  Yes Swayze, Ava, DO  Insulin Glargine (LANTUS SOLOSTAR) 100 UNIT/ML Solostar Pen Inject 10 Units into the skin at bedtime. And pen needles 4/day Patient taking differently: Inject 12 Units into the skin at bedtime. And pen needles 4/day 12/31/18  Yes Renato Shin, MD  insulin lispro (HUMALOG KWIKPEN) 100 UNIT/ML KwikPen Inject 0.06 mLs (6 Units total) into the skin 3 (three) times daily. Patient taking differently: Inject 5 Units into the skin 3 (three) times daily.  12/31/18  Yes Renato Shin, MD  LORazepam (ATIVAN) 0.5  MG tablet TAKE 1 TABLET BY MOUTH TWICE DAILY AS NEEDED FOR ANXIETY Patient taking differently: Take 0.5 mg by mouth 2 (two) times daily as needed for anxiety.  02/10/19  Yes [name], MD  metoCLOPramide (REGLAN) 10 MG tablet Take 1 tablet (10 mg total) by mouth every 6 (six) hours as needed for nausea or vomiting. 02/20/18  Yes Shelly Coss, MD  metoprolol tartrate (LOPRESSOR) 25 MG tablet Take 1 tablet (25 mg total) by mouth 2 (two) times daily. 10/23/18  Yes Swayze, Ava, DO  mirtazapine (REMERON) 30 MG tablet TAKE 1 TABLET BY MOUTH AT  BEDTIME Patient taking differently: Take 30 mg by mouth at bedtime.  02/16/19  Yes [name], MD  pantoprazole (PROTONIX) 40 MG tablet TAKE 1 TABLET BY MOUTH  DAILY Patient taking differently: Take 40 mg by mouth daily.  10/22/18  Yes Armbruster, Carlota Raspberry, MD  potassium chloride SA (KLOR-CON) 20 MEQ tablet Take 1 tablet (20 mEq total) by mouth daily. 03/14/19  Yes Drenda Freeze, MD  promethazine (PHENERGAN) 25 MG tablet Take 1 tablet (25 mg total) by mouth every 6 (six) hours as needed for nausea or vomiting. 03/14/19  Yes Drenda Freeze, MD  PROMETHEGAN 12.5 MG suppository Place 12.5 mg rectally every 8 (eight) hours as needed for nausea or vomiting.  03/14/19  Yes [provider]  traMADol (ULTRAM) 50 MG tablet TAKE 1 TABLET(50 MG) BY MOUTH EVERY 6  HOURS AS NEEDED FOR MODERATE PAIN OR SEVERE PAIN Patient taking differently: Take 50 mg by mouth every 6 (six) hours as needed for moderate pain or severe pain.  03/14/19  Yes [name], MD  ondansetron (ZOFRAN) 4 MG tablet Take 1 tablet (4 mg total) by mouth every 8 (eight) hours as needed for nausea or vomiting (If necessary, dont combine with Reglan.). Patient not taking: Reported on 03/13/2019 09/21/18   Aline August, MD  potassium chloride 20 MEQ TBCR Take 20 mEq by mouth 2 (two) times daily. Patient not taking: Reported on 03/13/2019 10/17/18   Isla Pence, MD    Allergies    Other and Lactose intolerance (gi)  Review of Systems   Review of Systems  Constitutional: Negative for chills and fever.  HENT: Negative for congestion and rhinorrhea.   Eyes: Negative for redness and visual disturbance.  Respiratory: Negative for shortness of breath and wheezing.   Cardiovascular: Negative for chest pain and palpitations.  Gastrointestinal: Positive for vomiting. Negative for nausea.  Genitourinary: Negative for dysuria and urgency.  Musculoskeletal: Positive for back pain. Negative for arthralgias and myalgias.  Skin: Negative for pallor and wound.  Neurological: Negative for dizziness and headaches.  28 yo F  Physical Exam Updated Vital Signs BP (!) 152/105   Pulse (!) 110   Temp 97.6 F (36.4 C) (Oral)   Resp 17   Ht 5' 7"  (1.702 m)   Wt 63.1 kg   LMP 02/28/2019 (Approximate)   SpO2 99%   BMI 21.77 kg/m   Physical Exam Vitals and nursing note reviewed.  Constitutional:      General: She is not in acute distress.    Appearance: She is well-developed. She is not diaphoretic.  HENT:     Head: Normocephalic and atraumatic.  Eyes:     Pupils: Pupils are equal, round, and reactive to light.  Cardiovascular:     Rate and Rhythm: Normal rate and regular rhythm.     Heart sounds: No murmur. No friction rub. No gallop.  Pulmonary:     Effort: Pulmonary effort is  normal.     Breath sounds: No wheezing or rales.  Abdominal:     General: There is no distension.     Palpations: Abdomen is soft.     Tenderness: There is no abdominal tenderness.  Musculoskeletal:        General: No tenderness.     Cervical back: Normal range of motion and neck supple.  Skin:    General: Skin is warm and dry.  Neurological:     Mental Status: She is alert and oriented to person, place, and time.  Psychiatric:        Behavior: Behavior normal.     ED Results / Procedures / Treatments   Labs (all labs ordered are listed, but only abnormal results are displayed) Labs Reviewed  CBC WITH DIFFERENTIAL/PLATELET - Abnormal; Notable for the following components:      Result Value   Hemoglobin 11.8 (*)    All other components within normal limits  COMPREHENSIVE METABOLIC PANEL - Abnormal; Notable for the following components:   Potassium 2.8 (*)    Chloride 93 (*)    Glucose, Bld 162 (*)    Total Protein 8.3 (*)    Anion gap 17 (*)    All other components within normal limits    EKG None  Radiology DG Chest 2 View  Result Date: 03/13/2019 CLINICAL DATA:  Nausea, vomiting, chest pain, SOB, cough EXAM: CHEST - 2 VIEW COMPARISON:  Chest radiograph 12/23/2018, 11/24/2018 FINDINGS: The heart size and mediastinal contours are within normal limits. The lungs are clear. No pneumothorax or pleural effusion. The visualized skeletal structures are unremarkable. IMPRESSION: No evidence of active disease in the chest. Electronically Signed   By: Audie Pinto M.D.   On: 03/13/2019 13:40   DG Abd Acute W/Chest  Result Date: 03/14/2019 CLINICAL DATA:  Abdominal pain vomiting. EXAM: DG ABDOMEN ACUTE W/ 1V CHEST COMPARISON:  12/23/2018 FINDINGS: Cardiomediastinal contours are normal. Lungs are clear. No free air beneath either right or left hemidiaphragm. Bowel gas pattern is remarkable only for relative lack of visualized small bowel gas. Signs of cholecystectomy. No acute  bone finding. IMPRESSION: 1. No active cardiopulmonary disease. 2. Negative abdominal radiographs. No free air or bowel obstruction. Electronically Signed   By: Zetta Bills M.D.   On: 03/14/2019 09:46    Procedures Procedures (including critical care time) Procedure note: Ultrasound Guided Peripheral IV Ultrasound guided peripheral 1.88 inch angiocath IV placement performed by me. Indications: Nursing unable to place IV. Details: The antecubital fossa and upper arm were evaluated with a multifrequency linear probe. Patent brachial veins were noted. 1 attempt was made to cannulate a vein under realtime US guidance with successful cannulation of the vein and catheter placement. There is return of non-pulsatile dark red blood. The patient tolerated the procedure well without complications. Images archived electronically.  CPT codes: [phone] and [phone]  Medications Ordered in ED Medications  droperidol (INAPSINE) 2.5 MG/ML injection 2.5 mg (2.5 mg Intravenous Given 03/15/19 0401)  diphenhydrAMINE (BENADRYL) injection 25 mg (25 mg Intravenous Given 03/15/19 0354)  sodium chloride 0.9 % bolus 1,000 mL (1,000 mLs Intravenous New Bag/Given 03/15/19 0352)  morphine 4 MG/ML injection 4 mg (4 mg Intravenous Given 03/15/19 0354)  famotidine (PEPCID) IVPB 20 mg premix (20 mg Intravenous New Bag/Given 03/15/19 0354)  alum & mag hydroxide-simeth (MAALOX/MYLANTA) 200-200-20 MG/5ML suspension 30 mL (30 mLs Oral Given 03/15/19 7253)    ED Course  I  have reviewed the triage vital signs and the nursing notes.  Pertinent labs & imaging results that were available during my care of the patient were reviewed by me and considered in my medical decision making (see chart for details).    MDM Rules/Calculators/A&P                      28 yo F with chief complaint of epigastric abdominal pain, nausea and vomiting.   K about the same as yesterday.  Feeling better, tolerating PO.  D/c home.   5:26 AM:  I have  discussed the diagnosis/risks/treatment options with the patient and believe the pt to be eligible for discharge home to follow-up with PCP. We also discussed returning to the ED immediately if new or worsening sx occur. We discussed the sx which are most concerning (e.g., sudden worsening pain, fever, inability to tolerate by mouth) that necessitate immediate return. Medications administered to the patient during their visit and any new prescriptions provided to the patient are listed below.  Medications given during this visit Medications  droperidol (INAPSINE) 2.5 MG/ML injection 2.5 mg (2.5 mg Intravenous Given 03/15/19 0401)  diphenhydrAMINE (BENADRYL) injection 25 mg (25 mg Intravenous Given 03/15/19 0354)  sodium chloride 0.9 % bolus 1,000 mL (1,000 mLs Intravenous New Bag/Given 03/15/19 0352)  morphine 4 MG/ML injection 4 mg (4 mg Intravenous Given 03/15/19 0354)  famotidine (PEPCID) IVPB 20 mg premix (20 mg Intravenous New Bag/Given 03/15/19 0354)  alum & mag hydroxide-simeth (MAALOX/MYLANTA) 200-200-20 MG/5ML suspension 30 mL (30 mLs Oral Given 03/15/19 0438)     The patient appears reasonably screen and/or stabilized for discharge and I doubt any other medical condition or other [facility] requiring further screening, evaluation, or treatment in the ED at this time prior to discharge.    Final Clinical Impression(s) / ED Diagnoses Final diagnoses:  Gastroparesis    Rx / DC Orders ED Discharge Orders    None       Deno Etienne, DO 03/15/19 2458

## 2019-03-15 NOTE — ED Triage Notes (Signed)
Patient complaining of back pain and vomiting. Patient states it is the same thing she always come for.

## 2019-03-15 NOTE — Discharge Instructions (Signed)
Return for worsening pain, inability to eat or drink

## 2019-03-15 NOTE — Telephone Encounter (Signed)
Rx request not prescribed by Dr.Parker

## 2019-03-16 ENCOUNTER — Other Ambulatory Visit: Payer: Self-pay

## 2019-03-16 MED ORDER — DICYCLOMINE HCL 20 MG PO TABS: 20.0000 mg | ORAL_TABLET | Freq: Three times a day (TID) | ORAL | 0 refills | Status: DC | PRN

## 2019-03-16 NOTE — Telephone Encounter (Signed)
Left voice message to patient Rx sent

## 2019-03-16 NOTE — Telephone Encounter (Signed)
Ok to refill but please make sure she did not mean to send to GI.  Algis Greenhouse. Jerline Pain, MD 03/16/2019 8:01 AM

## 2019-03-20 ENCOUNTER — Other Ambulatory Visit: Payer: Self-pay | Admitting: Endocrinology

## 2019-03-31 ENCOUNTER — Other Ambulatory Visit: Payer: Self-pay

## 2019-03-31 ENCOUNTER — Emergency Department (HOSPITAL_COMMUNITY)
Admission: EM | Admit: 2019-03-31 | Discharge: 2019-03-31 | Disposition: A | Discharge status: Home / Self Care | Payer: 59 | Attending: Emergency Medicine | Admitting: Emergency Medicine

## 2019-03-31 ENCOUNTER — Encounter (HOSPITAL_COMMUNITY): Payer: Self-pay | Admitting: Emergency Medicine

## 2019-03-31 DIAGNOSIS — K3184 Gastroparesis: Secondary | ICD-10-CM

## 2019-03-31 DIAGNOSIS — Z79899 Other long term (current) drug therapy: Secondary | ICD-10-CM | POA: Diagnosis not present

## 2019-03-31 DIAGNOSIS — R109 Unspecified abdominal pain: Secondary | ICD-10-CM | POA: Diagnosis present

## 2019-03-31 DIAGNOSIS — E109 Type 1 diabetes mellitus without complications: Secondary | ICD-10-CM | POA: Insufficient documentation

## 2019-03-31 DIAGNOSIS — Z794 Long term (current) use of insulin: Secondary | ICD-10-CM | POA: Diagnosis not present

## 2019-03-31 DIAGNOSIS — I10 Essential (primary) hypertension: Secondary | ICD-10-CM | POA: Diagnosis not present

## 2019-03-31 LAB — URINALYSIS, ROUTINE W REFLEX MICROSCOPIC
Bacteria, UA: NONE SEEN
Bilirubin Urine: NEGATIVE
Glucose, UA: >=500 mg/dL — AB
Ketones, ur: 5 mg/dL — AB
Leukocytes,Ua: NEGATIVE
Nitrite: NEGATIVE
Protein, ur: 100 mg/dL — AB
Specific Gravity, Urine: 1.009 (ref 1.005–1.030)
pH: 8 (ref 5.0–8.0)

## 2019-03-31 LAB — COMPREHENSIVE METABOLIC PANEL
ALT: 33 U/L (ref 0–44)
AST: 36 U/L (ref 15–41)
Albumin: 3.3 g/dL — ABNORMAL LOW (ref 3.5–5.0)
Alkaline Phosphatase: 71 U/L (ref 38–126)
Anion gap: 12 (ref 5–15)
BUN: 13 mg/dL (ref 6–20)
CO2: 30 mmol/L (ref 22–32)
Calcium: 8.8 mg/dL — ABNORMAL LOW (ref 8.9–10.3)
Chloride: 99 mmol/L (ref 98–111)
Creatinine, Ser: 0.69 mg/dL (ref 0.44–1.00)
GFR calc Af Amer: >60 mL/min (ref >60)
GFR calc non Af Amer: >60 mL/min (ref >60)
Glucose, Bld: 266 mg/dL — ABNORMAL HIGH (ref 70–99)
Potassium: 3.3 mmol/L — ABNORMAL LOW (ref 3.5–5.1)
Sodium: 141 mmol/L (ref 135–145)
Total Bilirubin: 0.7 mg/dL (ref 0.3–1.2)
Total Protein: 6.9 g/dL (ref 6.5–8.1)

## 2019-03-31 LAB — CBC
HCT: 29.3 % — ABNORMAL LOW (ref 36.0–46.0)
Hemoglobin: 9.1 g/dL — ABNORMAL LOW (ref 12.0–15.0)
MCH: 27.6 pg (ref 26.0–34.0)
MCHC: 31.1 g/dL (ref 30.0–36.0)
MCV: 88.8 fL (ref 80.0–100.0)
Platelets: 184 10*3/uL (ref 150–400)
RBC: 3.3 MIL/uL — ABNORMAL LOW (ref 3.87–5.11)
RDW: 13.1 % (ref 11.5–15.5)
WBC: 4.8 10*3/uL (ref 4.0–10.5)
nRBC: 0 % (ref 0.0–0.2)

## 2019-03-31 LAB — LIPASE, BLOOD: Lipase: 18 U/L (ref 11–51)

## 2019-03-31 LAB — I-STAT BETA HCG BLOOD, ED (MC, WL, AP ONLY): I-stat hCG, quantitative: <5 m[IU]/mL (ref <5)

## 2019-03-31 MED ORDER — METOCLOPRAMIDE HCL 5 MG/ML IJ SOLN
10.0000 mg | Freq: Once | INTRAMUSCULAR | Status: AC
  Administered 2019-03-31: 10 mg via INTRAVENOUS
  Filled 2019-03-31: qty 2

## 2019-03-31 MED ORDER — FAMOTIDINE IN NACL 20-0.9 MG/50ML-% IV SOLN
20.0000 mg | Freq: Once | INTRAVENOUS | Status: AC
  Administered 2019-03-31: 20 mg via INTRAVENOUS
  Filled 2019-03-31: qty 50

## 2019-03-31 MED ORDER — MORPHINE SULFATE (PF) 2 MG/ML IV SOLN
2.0000 mg | Freq: Once | INTRAVENOUS | Status: AC
  Administered 2019-03-31: 2 mg via INTRAVENOUS
  Filled 2019-03-31: qty 1

## 2019-03-31 MED ORDER — HYDROMORPHONE HCL 1 MG/ML IJ SOLN
0.5000 mg | Freq: Once | INTRAMUSCULAR | Status: DC
  Filled 2019-03-31: qty 1

## 2019-03-31 MED ORDER — HYDROMORPHONE HCL 1 MG/ML IJ SOLN
0.5000 mg | Freq: Once | INTRAMUSCULAR | Status: AC
  Administered 2019-03-31: 0.5 mg via INTRAVENOUS
  Filled 2019-03-31: qty 1

## 2019-03-31 MED ORDER — DIPHENHYDRAMINE HCL 50 MG/ML IJ SOLN
25.0000 mg | Freq: Once | INTRAMUSCULAR | Status: AC
  Administered 2019-03-31: 25 mg via INTRAVENOUS
  Filled 2019-03-31: qty 1

## 2019-03-31 MED ORDER — DROPERIDOL 2.5 MG/ML IJ SOLN
2.5000 mg | Freq: Once | INTRAMUSCULAR | Status: AC
  Administered 2019-03-31: 2.5 mg via INTRAVENOUS
  Filled 2019-03-31: qty 2

## 2019-03-31 MED ORDER — SODIUM CHLORIDE 0.9 % IV BOLUS
1000.0000 mL | Freq: Once | INTRAVENOUS | Status: AC
  Administered 2019-03-31: 1000 mL via INTRAVENOUS

## 2019-03-31 NOTE — ED Provider Notes (Signed)
[facility] DEPT Provider Note   CSN: [id number] Arrival date & time: 03/31/19  1434     History Chief Complaint  Patient presents with  . Abdominal Pain  . Emesis  . Diarrhea  . Back Pain    WILHEMENIA CAMBA is a 28 y.o. female presenting for evaluation nausea, vomiting, abdominal pain.  Patient states her symptoms began today.  She reports acute onset abdominal pain, nausea, vomiting, diarrhea.  Pain radiates to her back.  This is typical of her gastroparesis symptoms.  Patient states she has not been able to take her home medicines as she continues to throw up.  She denies hematemesis.  She denies fevers, chills, chest pain, shortness of breath.  Patient states she has had decreased urination due to vomiting and dehydration.  History of diabetes and gastroparesis, no other medical problems.  Additional history obtained from chart review.  Patient with a history of diabetes, hypertension, gastroparesis.  This is the patient's 5th visit this month for the same.  HPI     Past Medical History:  Diagnosis Date  . Allergy   . Anemia   . Anxiety   . Blood transfusion without reported diagnosis    Dec 2018  . Cataract    right eye  . Depression   . Diabetes type 1, uncontrolled ([place]) 11/14/2011   Since age 41  . Fibromyalgia   . Gastroparesis   . GERD (gastroesophageal reflux disease)   . Hypertension   . Infection    UTI April 2016    Patient Active Problem List   Diagnosis Date Noted  . Contraception management 08/06/2018  . Cyclical vomiting syndrome 06/07/2018  . Diabetic gastroparesis associated with type 1 diabetes mellitus ([place]) 06/04/2018  . Anxiety 05/31/2018  . Peripheral edema 05/31/2018  . Normocytic anemia 07/10/2017  . GERD (gastroesophageal reflux disease) 06/23/2017  . MDD (major depressive disorder), recurrent episode, mild ([place]) 02/28/2017  . Diabetes mellitus type 1 ([place]) 04/28/2013  . Protein-calorie malnutrition,  severe ([place]) 04/28/2013  . Hypertension associated with diabetes ([place]) 11/14/2011  . Chronic abdominal pain 09/18/2011    Past Surgical History:  Procedure Laterality Date  . ANKLE SURGERY    . CHOLECYSTECTOMY  11/15/2011   Procedure: LAPAROSCOPIC CHOLECYSTECTOMY WITH INTRAOPERATIVE CHOLANGIOGRAM;  Surgeon: [name], MD;  Location: [facility];  Service: General;  Laterality: N/A;  . COLONOSCOPY    . COLONOSCOPY WITH PROPOFOL N/A 06/27/2017   Procedure: COLONOSCOPY WITH PROPOFOL;  Surgeon: Milus Banister, MD;  Location: [facility];  Service: Endoscopy;  Laterality: N/A;  . ESOPHAGOGASTRODUODENOSCOPY  12/03/2011   Procedure: ESOPHAGOGASTRODUODENOSCOPY (EGD);  Surgeon: Beryle Beams, MD;  Location: [facility];  Service: Endoscopy;  Laterality: N/A;  . FLEXIBLE SIGMOIDOSCOPY N/A 03/10/2017   Procedure: FLEXIBLE SIGMOIDOSCOPY;  Surgeon: Carol Ada, MD;  Location: [facility];  Service: Endoscopy;  Laterality: N/A;  . INCISION AND DRAINAGE PERIRECTAL ABSCESS N/A 03/01/2017   Procedure: IRRIGATION AND DEBRIDEMENT PERIRECTAL ABSCESS;  Surgeon: Alphonsa Overall, MD;  Location: [facility];  Service: General;  Laterality: N/A;  . IRRIGATION AND DEBRIDEMENT BUTTOCKS N/A 03/23/2017   Procedure: IRRIGATION AND DEBRIDEMENT BUTTOCKS, SETON PLACEMENT;  Surgeon: [name], MD;  Location: [facility];  Service: General;  Laterality: N/A;  . LAPAROSCOPY  11/23/2011   Procedure: LAPAROSCOPY DIAGNOSTIC;  Surgeon: Edward Jolly, MD;  Location: [facility];  Service: General;  Laterality: N/A;  . SIGMOIDOSCOPY    . UPPER GASTROINTESTINAL ENDOSCOPY    . WISDOM TOOTH EXTRACTION  OB History    Gravida  1   Para  1   Term      Preterm  1   AB      Living  1     SAB      TAB      Ectopic      Multiple  0   Live Births  1           Family History  Problem Relation Age of Onset  . Diabetes Mother   . Hypertension Father   . Colon cancer Paternal Grandmother        pt thinks PGM  was dx in her 57's  . Diabetes Paternal Grandmother   . Diabetes Maternal Grandmother   . Diabetes Maternal Grandfather   . Diabetes Paternal Grandfather   . Diabetes Other   . Esophageal cancer Neg Hx   . Liver cancer Neg Hx   . Pancreatic cancer Neg Hx   . Stomach cancer Neg Hx   . Rectal cancer Neg Hx     Social History   Tobacco Use  . Smoking status: Never Smoker  . Smokeless tobacco: Never Used  Substance Use Topics  . Alcohol use: No    Alcohol/week: 0.0 standard drinks  . Drug use: Yes    Types: Marijuana    Home Medications Prior to Admission medications   Medication Sig Start Date End Date Taking? Authorizing Provider  cyclobenzaprine (FLEXERIL) 10 MG tablet TAKE 1 TABLET(10 MG) BY MOUTH THREE TIMES DAILY AS NEEDED FOR MUSCLE SPASMS Patient taking differently: Take 10 mg by mouth 3 (three) times daily as needed for muscle spasms.  02/21/19  Yes [name], MD  dicyclomine (BENTYL) 20 MG tablet Take 1 tablet (20 mg total) by mouth 3 (three) times daily as needed for spasms. 03/16/19  Yes [name], MD  furosemide (LASIX) 40 MG tablet TAKE 1 TABLET BY MOUTH  DAILY Patient taking differently: Take 40 mg by mouth daily.  11/11/18  Yes Armbruster, Carlota Raspberry, MD  hydrALAZINE (APRESOLINE) 50 MG tablet Take 1 tablet (50 mg total) by mouth every 6 (six) hours. 10/23/18  Yes Swayze, Ava, DO  Insulin Glargine (LANTUS SOLOSTAR) 100 UNIT/ML Solostar Pen Inject 10 Units into the skin at bedtime. And pen needles 4/day Patient taking differently: Inject 12 Units into the skin at bedtime. And pen needles 4/day 12/31/18  Yes Renato Shin, MD  insulin lispro (HUMALOG KWIKPEN) 100 UNIT/ML KwikPen Inject 0.06 mLs (6 Units total) into the skin 3 (three) times daily. Patient taking differently: Inject 5 Units into the skin 3 (three) times daily.  12/31/18  Yes Renato Shin, MD  LORazepam (ATIVAN) 0.5 MG tablet TAKE 1 TABLET BY MOUTH TWICE DAILY AS NEEDED FOR ANXIETY Patient taking  differently: Take 0.5 mg by mouth 2 (two) times daily as needed for anxiety.  02/10/19  Yes [name], MD  metoCLOPramide (REGLAN) 10 MG tablet Take 1 tablet (10 mg total) by mouth every 6 (six) hours as needed for nausea or vomiting. 02/20/18  Yes Shelly Coss, MD  metoprolol tartrate (LOPRESSOR) 25 MG tablet Take 1 tablet (25 mg total) by mouth 2 (two) times daily. 10/23/18  Yes Swayze, Ava, DO  mirtazapine (REMERON) 30 MG tablet TAKE 1 TABLET BY MOUTH AT  BEDTIME Patient taking differently: Take 30 mg by mouth at bedtime.  02/16/19  Yes [name], MD  ondansetron (ZOFRAN) 4 MG tablet Take 1 tablet (4 mg total) by  mouth every 8 (eight) hours as needed for nausea or vomiting (If necessary, dont combine with Reglan.). 09/21/18  Yes Aline August, MD  pantoprazole (PROTONIX) 40 MG tablet TAKE 1 TABLET BY MOUTH  DAILY Patient taking differently: Take 40 mg by mouth daily.  10/22/18  Yes Armbruster, Carlota Raspberry, MD  promethazine (PHENERGAN) 25 MG tablet Take 1 tablet (25 mg total) by mouth every 6 (six) hours as needed for nausea or vomiting. 03/14/19  Yes Drenda Freeze, MD  PROMETHEGAN 12.5 MG suppository Place 12.5 mg rectally every 8 (eight) hours as needed for nausea or vomiting.  03/14/19  Yes [provider]  traMADol (ULTRAM) 50 MG tablet TAKE 1 TABLET(50 MG) BY MOUTH EVERY 6 HOURS AS NEEDED FOR MODERATE PAIN OR SEVERE PAIN Patient taking differently: Take 50 mg by mouth every 6 (six) hours as needed for moderate pain or severe pain.  03/14/19  Yes [name], MD  Continuous Blood Gluc Receiver (DEXCOM G6 RECEIVER) DEVI USE AS DIRECTED TO MONITOR GLUCOSE LEVELS 10/29/18   [provider]  Continuous Blood Gluc Sensor (DEXCOM G6 SENSOR) MISC 1 each by Does not apply route See admin instructions. every 10 days. 12/31/18   Renato Shin, MD  Continuous Blood Gluc Transmit (DEXCOM G6 TRANSMITTER) MISC USE AS DIRECTED TO MONITOR GLUCOSE LEVELS 10/29/18   [provider]  potassium chloride 20 MEQ TBCR Take 20 mEq by mouth 2 (two) times daily. Patient not taking: Reported on 03/31/2019 10/17/18   Isla Pence, MD  potassium chloride SA (KLOR-CON) 20 MEQ tablet Take 1 tablet (20 mEq total) by mouth daily. Patient not taking: Reported on 03/31/2019 03/14/19   Drenda Freeze, MD    Allergies    Other and Lactose intolerance (gi)  Review of Systems   Review of Systems  Gastrointestinal: Positive for abdominal pain, diarrhea, nausea and vomiting.  All other systems reviewed and are negative.   Physical Exam Updated Vital Signs BP (!) 183/110   Pulse (!) 116   Temp 98.2 F (36.8 C) (Oral)   Resp (!) 22   Ht 5' 7"  (1.702 m)   Wt 59 kg   LMP 03/17/2019   SpO2 100%   BMI 20.36 kg/m   Physical Exam Vitals and nursing note reviewed.  Constitutional:      General: She is not in acute distress.    Appearance: She is well-developed.     Comments: Walking around the room and unable to sit still.  Nontoxic in appearance  HENT:     Head: Normocephalic and atraumatic.  Eyes:     Extraocular Movements: Extraocular movements intact.     Conjunctiva/sclera: Conjunctivae normal.     Pupils: Pupils are equal, round, and reactive to light.  Cardiovascular:     Rate and Rhythm: Regular rhythm. Tachycardia present.     Pulses: Normal pulses.     Comments: Tachycardic around 115 Pulmonary:     Effort: Pulmonary effort is normal. No respiratory distress.     Breath sounds: Normal breath sounds. No wheezing.  Abdominal:     General: There is no distension.     Palpations: Abdomen is soft. There is no mass.     Tenderness: There is no abdominal tenderness. There is no guarding or rebound.     Comments: No tenderness to palpation.  Soft with rigidity, guarding, distention. BGL monitor noted on L abd  Musculoskeletal:        General: Normal range of motion.  Cervical back: Normal range of motion and neck supple.  Skin:    General:  Skin is warm and dry.     Capillary Refill: Capillary refill takes less than 2 seconds.  Neurological:     Mental Status: She is alert and oriented to person, place, and time.     ED Results / Procedures / Treatments   Labs (all labs ordered are listed, but only abnormal results are displayed) Labs Reviewed  COMPREHENSIVE METABOLIC PANEL - Abnormal; Notable for the following components:      Result Value   Potassium 3.3 (*)    Glucose, Bld 266 (*)    Calcium 8.8 (*)    Albumin 3.3 (*)    All other components within normal limits  CBC - Abnormal; Notable for the following components:   RBC 3.30 (*)    Hemoglobin 9.1 (*)    HCT 29.3 (*)    All other components within normal limits  LIPASE, BLOOD  URINALYSIS, ROUTINE W REFLEX MICROSCOPIC  I-STAT BETA HCG BLOOD, ED ([facility], [facility], [facility] ONLY)    EKG None  Radiology No results found.  Procedures Procedures (including critical care time)  Medications Ordered in ED Medications  metoCLOPramide (REGLAN) injection 10 mg (has no administration in time range)  diphenhydrAMINE (BENADRYL) injection 25 mg (has no administration in time range)  famotidine (PEPCID) IVPB 20 mg premix (has no administration in time range)  morphine 2 MG/ML injection 2 mg (2 mg Intravenous Given 03/31/19 1901)  droperidol (INAPSINE) 2.5 MG/ML injection 2.5 mg (2.5 mg Intravenous Given 03/31/19 1856)  sodium chloride 0.9 % bolus 1,000 mL (0 mLs Intravenous Stopped 03/31/19 1957)    ED Course  I have reviewed the triage vital signs and the nursing notes.  Pertinent labs & imaging results that were available during my care of the patient were reviewed by me and considered in my medical decision making (see chart for details).    MDM Rules/Calculators/A&P                      Patient presenting for evaluation nausea, vomiting, abd pain.  History and symptoms are consistent with gastroparesis.  No fevers, chills or tenderness palpation the abdomen to indicate  intra-abdominal infection.  I do not believe she needs emergent CT scan.  Labs obtained from triage are reassuring.  No leukocytosis.  Hemoglobin low at 9.1, this appears patient's baseline.  Potassium 3.3, this is improved from previous.  Slightly hyperglycemic at 266.  Will attempt symptomatic control and reassess.  Will give fluids, droperidol, and morphine.  On reassessment, patient reports little to no improvement.  She reports 2 further episodes of emesis since medicines were given and continued abdominal pain.  Will give another round of symptom control with Benadryl, Reglan, Pepcid. ekg with reassuring qtc.   Pt signed out to Charlsie Quest, NP for reassessment after 2nd round of medicine. If improved, pt can likely be d/c.   Final Clinical Impression(s) / ED Diagnoses Final diagnoses:  None    Rx / DC Orders ED Discharge Orders    None       Franchot Heidelberg, PA-C 03/31/19 2105    Lucrezia Starch, MD 04/01/19 [phone]

## 2019-03-31 NOTE — ED Provider Notes (Signed)
Patient received in sign out from S. Caccavale, PA. Patient with history of diabetic gastroparesis. Episode of abominal pain, nausea, vomiting and diarrhea began today. Patient has had multiple ED visits this month for same.   Symptoms have improved after the second round of medication. Patient states she is ready to return home.  Patient is nontoxic, nonseptic appearing, in no apparent distress.  Patient's pain and other symptoms adequately managed in emergency department.  Fluid bolus given.  Patient does not meet the SIRS or Sepsis criteria.  Patient does not have a surgical abdomin and there are no peritoneal signs.  Patient discharged home with symptomatic treatment and given strict instructions for follow-up with their primary care physician.  I have also discussed reasons to return immediately to the ER.  Patient expresses understanding and agrees with plan.     Etta Quill, NP 03/31/19 [place], [place], DO 04/05/19 6060

## 2019-03-31 NOTE — ED Notes (Signed)
IV Team at patient bedside.

## 2019-03-31 NOTE — ED Triage Notes (Signed)
Pt c/o abd pains with n/v/d that started today as well as back pains.

## 2019-04-01 ENCOUNTER — Other Ambulatory Visit: Payer: Self-pay | Admitting: Family Medicine

## 2019-04-05 ENCOUNTER — Other Ambulatory Visit: Payer: Self-pay | Admitting: Endocrinology

## 2019-04-05 NOTE — Telephone Encounter (Signed)
Rx request 

## 2019-04-09 ENCOUNTER — Other Ambulatory Visit: Payer: Self-pay | Admitting: Family Medicine

## 2019-04-17 ENCOUNTER — Other Ambulatory Visit: Payer: Self-pay | Admitting: Family Medicine

## 2019-04-18 ENCOUNTER — Other Ambulatory Visit: Payer: Self-pay | Admitting: Family Medicine

## 2019-04-23 ENCOUNTER — Other Ambulatory Visit: Payer: Self-pay

## 2019-04-23 ENCOUNTER — Emergency Department (HOSPITAL_COMMUNITY)
Admission: EM | Admit: 2019-04-23 | Discharge: 2019-04-23 | Disposition: A | Discharge status: Home / Self Care | Payer: 59 | Attending: Emergency Medicine | Admitting: Emergency Medicine

## 2019-04-23 DIAGNOSIS — I10 Essential (primary) hypertension: Secondary | ICD-10-CM | POA: Insufficient documentation

## 2019-04-23 DIAGNOSIS — R112 Nausea with vomiting, unspecified: Secondary | ICD-10-CM | POA: Insufficient documentation

## 2019-04-23 DIAGNOSIS — R1111 Vomiting without nausea: Secondary | ICD-10-CM

## 2019-04-23 DIAGNOSIS — Z79899 Other long term (current) drug therapy: Secondary | ICD-10-CM | POA: Insufficient documentation

## 2019-04-23 DIAGNOSIS — E109 Type 1 diabetes mellitus without complications: Secondary | ICD-10-CM | POA: Diagnosis not present

## 2019-04-23 LAB — COMPREHENSIVE METABOLIC PANEL
ALT: 40 U/L (ref 0–44)
AST: 24 U/L (ref 15–41)
Albumin: 3.7 g/dL (ref 3.5–5.0)
Alkaline Phosphatase: 105 U/L (ref 38–126)
Anion gap: 17 — ABNORMAL HIGH (ref 5–15)
BUN: 14 mg/dL (ref 6–20)
CO2: 27 mmol/L (ref 22–32)
Calcium: 9.4 mg/dL (ref 8.9–10.3)
Chloride: 92 mmol/L — ABNORMAL LOW (ref 98–111)
Creatinine, Ser: 0.9 mg/dL (ref 0.44–1.00)
GFR calc Af Amer: >60 mL/min (ref >60)
GFR calc non Af Amer: >60 mL/min (ref >60)
Glucose, Bld: 308 mg/dL — ABNORMAL HIGH (ref 70–99)
Potassium: 3.2 mmol/L — ABNORMAL LOW (ref 3.5–5.1)
Sodium: 136 mmol/L (ref 135–145)
Total Bilirubin: 0.8 mg/dL (ref 0.3–1.2)
Total Protein: 8.2 g/dL — ABNORMAL HIGH (ref 6.5–8.1)

## 2019-04-23 LAB — URINALYSIS, ROUTINE W REFLEX MICROSCOPIC
Bacteria, UA: NONE SEEN
Bilirubin Urine: NEGATIVE
Glucose, UA: >=500 mg/dL — AB
Ketones, ur: 20 mg/dL — AB
Leukocytes,Ua: NEGATIVE
Nitrite: NEGATIVE
Protein, ur: >=300 mg/dL — AB
RBC / HPF: >50 RBC/hpf — ABNORMAL HIGH (ref 0–5)
Specific Gravity, Urine: 1.015 (ref 1.005–1.030)
pH: 8 (ref 5.0–8.0)

## 2019-04-23 LAB — CBC WITH DIFFERENTIAL/PLATELET
Abs Immature Granulocytes: 0.02 10*3/uL (ref 0.00–0.07)
Basophils Absolute: 0 10*3/uL (ref 0.0–0.1)
Basophils Relative: 1 %
Eosinophils Absolute: 0 10*3/uL (ref 0.0–0.5)
Eosinophils Relative: 1 %
HCT: 32.4 % — ABNORMAL LOW (ref 36.0–46.0)
Hemoglobin: 10.7 g/dL — ABNORMAL LOW (ref 12.0–15.0)
Immature Granulocytes: 0 %
Lymphocytes Relative: 18 %
Lymphs Abs: 1.1 10*3/uL (ref 0.7–4.0)
MCH: 27.2 pg (ref 26.0–34.0)
MCHC: 33 g/dL (ref 30.0–36.0)
MCV: 82.4 fL (ref 80.0–100.0)
Monocytes Absolute: 0.2 10*3/uL (ref 0.1–1.0)
Monocytes Relative: 3 %
Neutro Abs: 4.5 10*3/uL (ref 1.7–7.7)
Neutrophils Relative %: 77 %
Platelets: 222 10*3/uL (ref 150–400)
RBC: 3.93 MIL/uL (ref 3.87–5.11)
RDW: 12.1 % (ref 11.5–15.5)
WBC: 5.9 10*3/uL (ref 4.0–10.5)
nRBC: 0 % (ref 0.0–0.2)

## 2019-04-23 LAB — CBG MONITORING, ED
Glucose-Capillary: 337 mg/dL — ABNORMAL HIGH (ref 70–99)
Glucose-Capillary: 293 mg/dL — ABNORMAL HIGH (ref 70–99)

## 2019-04-23 LAB — PREGNANCY, URINE: Preg Test, Ur: NEGATIVE

## 2019-04-23 MED ORDER — DROPERIDOL 2.5 MG/ML IJ SOLN
2.5000 mg | Freq: Once | INTRAMUSCULAR | Status: AC
  Administered 2019-04-23: 2.5 mg via INTRAVENOUS
  Filled 2019-04-23: qty 2

## 2019-04-23 MED ORDER — SODIUM CHLORIDE 0.9 % IV BOLUS
1000.0000 mL | Freq: Once | INTRAVENOUS | Status: AC
  Administered 2019-04-23: 1000 mL via INTRAVENOUS

## 2019-04-23 MED ORDER — POTASSIUM CHLORIDE CRYS ER 20 MEQ PO TBCR
40.0000 meq | EXTENDED_RELEASE_TABLET | Freq: Once | ORAL | Status: AC
  Administered 2019-04-23: 40 meq via ORAL
  Filled 2019-04-23: qty 2

## 2019-04-23 NOTE — ED Notes (Signed)
Patient tolerating oral fluids  

## 2019-04-23 NOTE — ED Notes (Addendum)
Patient ambulated to the restroom without assistance

## 2019-04-23 NOTE — ED Provider Notes (Signed)
[facility] DEPT Provider Note   CSN: [id number] Arrival date & time: 04/23/19  0623     History Chief Complaint  Patient presents with  . Back Pain  . Nausea    [name] is a 29 y.o. female.  The history is provided by the patient. No language interpreter was used.  Abdominal Pain Pain location:  Generalized Pain quality: aching   Pain radiates to:  Does not radiate Pain severity:  Moderate Onset quality:  Gradual Timing:  Constant Progression:  Worsening Chronicity:  Recurrent Relieved by:  Nothing Worsened by:  Nothing Ineffective treatments:  None tried Associated symptoms: anorexia, nausea and vomiting    Pt reports she has diabetic gastroparesis.  Pt reports she can not keep fluids down.  Pt reports she responds best to droperidol.  Pt report nausea medications usually do not work Pt has been taking phenergan at home     Past Medical History:  Diagnosis Date  . Allergy   . Anemia   . Anxiety   . Blood transfusion without reported diagnosis    Dec 2018  . Cataract    right eye  . Depression   . Diabetes type 1, uncontrolled ([place]) 11/14/2011   Since age 71  . Fibromyalgia   . Gastroparesis   . GERD (gastroesophageal reflux disease)   . Hypertension   . Infection    UTI April 2016    Patient Active Problem List   Diagnosis Date Noted  . Contraception management 08/06/2018  . Cyclical vomiting syndrome 06/07/2018  . Diabetic gastroparesis associated with type 1 diabetes mellitus ([place]) 06/04/2018  . Anxiety 05/31/2018  . Peripheral edema 05/31/2018  . Normocytic anemia 07/10/2017  . GERD (gastroesophageal reflux disease) 06/23/2017  . 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 Diabetes mellitus type 1 ([place]) 04/28/2013  . Protein-calorie malnutrition, severe ([place]) 04/28/2013  . Hypertension associated with diabetes ([place]) 11/14/2011  . Chronic abdominal pain 09/18/2011    Past  Surgical History:  Procedure Laterality Date  . ANKLE SURGERY    . CHOLECYSTECTOMY  11/15/2011   Procedure: LAPAROSCOPIC CHOLECYSTECTOMY WITH INTRAOPERATIVE CHOLANGIOGRAM;  Surgeon: [name], MD;  Location: [facility];  Service: General;  Laterality: N/A;  . COLONOSCOPY    . COLONOSCOPY WITH PROPOFOL N/A 06/27/2017   Procedure: COLONOSCOPY WITH PROPOFOL;  Surgeon: Milus Banister, MD;  [facility]: [facility];  Service: Endoscopy;  Laterality: N/A;  . ESOPHAGOGASTRODUODENOSCOPY  12/03/2011   Procedure: ESOPHAGOGASTRODUODENOSCOPY (EGD);  Surgeon: Beryle Beams, MD;  Location: [facility];  Service: Endoscopy;  Laterality: N/A;  . FLEXIBLE SIGMOIDOSCOPY N/A 03/10/2017   Procedure: FLEXIBLE SIGMOIDOSCOPY;  Surgeon: Carol Ada, MD;  [facility];  Service: Endoscopy;  Laterality: N/A;  . INCISION AND DRAINAGE PERIRECTAL ABSCESS N/A 03/01/2017   Procedure: IRRIGATION AND DEBRIDEMENT PERIRECTAL ABSCESS;  Surgeon: Alphonsa Overall, MD;  [facility]: [facility];  Service: General;  Laterality: N/A;  . IRRIGATION AND DEBRIDEMENT BUTTOCKS N/A 03/23/2017   Procedure: IRRIGATION AND DEBRIDEMENT BUTTOCKS, SETON PLACEMENT;  Surgeon: [name], MD;  [facility]: [facility];  Service: General;  Laterality: N/A;  . LAPAROSCOPY  11/23/2011   Procedure: LAPAROSCOPY DIAGNOSTIC;  Surgeon: Edward Jolly, MD;  [facility]: [facility];  Service: General;  Laterality: N/A;  . SIGMOIDOSCOPY    . UPPER GASTROINTESTINAL ENDOSCOPY    . WISDOM TOOTH EXTRACTION       OB History    Gravida  1   Para  1  Term      Preterm  1   AB      Living  1     SAB      TAB      Ectopic      Multiple  0   Live Births  1           Family History  Problem Relation Age of Onset  . Diabetes Mother   . Hypertension Father   . Colon cancer Paternal Grandmother        pt thinks PGM was dx in her 23's  . Diabetes Paternal Grandmother   . Diabetes Maternal Grandmother   . Diabetes Maternal Grandfather   .  Diabetes Paternal Grandfather   . Diabetes Other   . Esophageal cancer Neg Hx   . Liver cancer Neg Hx   . Pancreatic cancer Neg Hx   . Stomach cancer Neg Hx   . Rectal cancer Neg Hx     Social History   Tobacco Use  . Smoking status: Never Smoker  . Smokeless tobacco: Never Used  Substance Use Topics  . Alcohol use: No    Alcohol/week: 0.0 standard drinks  . Drug use: Yes    Types: Marijuana    Home Medications Prior to Admission medications   Medication Sig Start Date End Date Taking? Authorizing Provider  cyclobenzaprine (FLEXERIL) 10 MG tablet TAKE 1 TABLET(10 MG) BY MOUTH THREE TIMES DAILY AS NEEDED FOR MUSCLE SPASMS Patient taking differently: Take 10 mg by mouth 3 (three) times daily as needed for muscle spasms.  04/19/19  Yes [name], MD  dicyclomine (BENTYL) 20 MG tablet Take 1 tablet (20 mg total) by mouth 3 (three) times daily as needed for spasms. 03/16/19  Yes [name], MD  furosemide (LASIX) 40 MG tablet TAKE 1 TABLET BY MOUTH  DAILY Patient taking differently: Take 40 mg by mouth daily.  11/11/18  Yes Armbruster, Carlota Raspberry, MD  hydrALAZINE (APRESOLINE) 50 MG tablet Take 1 tablet (50 mg total) by mouth every 6 (six) hours. 10/23/18  Yes Swayze, Ava, DO  Insulin Glargine (LANTUS SOLOSTAR) 100 UNIT/ML Solostar Pen Inject 10 Units into the skin at bedtime. And pen needles 4/day Patient taking differently: Inject 12 Units into the skin at bedtime. And pen needles 4/day 12/31/18  Yes Renato Shin, MD  insulin lispro (HUMALOG KWIKPEN) 100 UNIT/ML KwikPen Inject 0.06 mLs (6 Units total) into the skin 3 (three) times daily. Patient taking differently: Inject 5 Units into the skin 3 (three) times daily.  12/31/18  Yes Renato Shin, MD  LORazepam (ATIVAN) 0.5 MG tablet Take 1 tablet (0.5 mg total) by mouth 2 (two) times daily as needed for anxiety. 04/05/19  Yes [name], MD  metoprolol tartrate (LOPRESSOR) 25 MG tablet Take 1 tablet (25 mg total) by mouth 2  (two) times daily. 10/23/18  Yes Swayze, Ava, DO  mirtazapine (REMERON) 30 MG tablet TAKE 1 TABLET BY MOUTH AT  BEDTIME Patient taking differently: Take 30 mg by mouth at bedtime.  04/19/19  Yes [name], MD  pantoprazole (PROTONIX) 40 MG tablet TAKE 1 TABLET BY MOUTH  DAILY Patient taking differently: Take 40 mg by mouth daily.  10/22/18  Yes Armbruster, Carlota Raspberry, MD  promethazine (PHENERGAN) 25 MG tablet Take 1 tablet (25 mg total) by mouth every 6 (six) hours as needed for nausea or vomiting. 03/14/19  Yes Drenda Freeze, MD  PROMETHEGAN 12.5 MG suppository Place 12.5 mg rectally  every 8 (eight) hours as needed for nausea or vomiting.  03/14/19  Yes [provider]  traMADol (ULTRAM) 50 MG tablet TAKE 1 TABLET(50 MG) BY MOUTH EVERY 6 HOURS AS NEEDED FOR MODERATE PAIN OR SEVERE PAIN Patient taking differently: Take 50 mg by mouth every 6 (six) hours as needed for moderate pain or severe pain.  03/14/19  Yes [name], MD  Continuous Blood Gluc Receiver (DEXCOM G6 RECEIVER) DEVI USE AS DIRECTED TO MONITOR GLUCOSE LEVELS 10/29/18   [provider]  Continuous Blood Gluc Sensor (DEXCOM G6 SENSOR) MISC 1 each by Does not apply route See admin instructions. every 10 days. 12/31/18   Renato Shin, MD  Continuous Blood Gluc Transmit (DEXCOM G6 TRANSMITTER) MISC USE AS DIRECTED TO MONITOR GLUCOSE LEVELS 10/29/18   [provider]  metoCLOPramide (REGLAN) 10 MG tablet Take 1 tablet (10 mg total) by mouth every 6 (six) hours as needed for nausea or vomiting. Patient not taking: Reported on 04/23/2019 02/20/18   Shelly Coss, MD  ondansetron (ZOFRAN) 4 MG tablet Take 1 tablet (4 mg total) by mouth every 8 (eight) hours as needed for nausea or vomiting (If necessary, dont combine with Reglan.). Patient not taking: Reported on 04/23/2019 09/21/18   Aline August, MD  potassium chloride 20 MEQ TBCR Take 20 mEq by mouth 2 (two) times daily. Patient not taking: Reported on  03/31/2019 10/17/18   Isla Pence, MD  potassium chloride SA (KLOR-CON) 20 MEQ tablet Take 1 tablet (20 mEq total) by mouth daily. Patient not taking: Reported on 03/31/2019 03/14/19   Drenda Freeze, MD    Allergies    Other and Lactose intolerance (gi)  Review of Systems   Review of Systems  Gastrointestinal: Positive for abdominal pain, anorexia, nausea and vomiting.  All other systems reviewed and are negative.   Physical Exam Updated Vital Signs BP (!) 158/104 (BP Location: Left Arm)   Pulse (!) 120   Temp 98.5 F (36.9 C) (Oral)   Resp 16   Ht 5' 7"  (1.702 m)   Wt 58 kg   SpO2 100%   BMI 20.03 kg/m   Physical Exam Vitals and nursing note reviewed.  Constitutional:      Appearance: She is well-developed.  HENT:     Head: Normocephalic.     Mouth/Throat:     Mouth: Mucous membranes are moist.  Cardiovascular:     Rate and Rhythm: Normal rate.  Pulmonary:     Effort: Pulmonary effort is normal.  Abdominal:     General: There is no distension.  Musculoskeletal:        General: Normal range of motion.     Cervical back: Normal range of motion.  Neurological:     Mental Status: She is alert and oriented to person, place, and time.  Psychiatric:        Mood and Affect: Mood normal.     ED Results / Procedures / Treatments   Labs (all labs ordered are listed, but only abnormal results are displayed) Labs Reviewed  CBC WITH DIFFERENTIAL/PLATELET - Abnormal; Notable for the following components:      Result Value   Hemoglobin 10.7 (*)    HCT 32.4 (*)    All other components within normal limits  COMPREHENSIVE METABOLIC PANEL - Abnormal; Notable for the following components:   Potassium 3.2 (*)    Chloride 92 (*)    Glucose, Bld 308 (*)    Total Protein 8.2 (*)  Anion gap 17 (*)    All other components within normal limits  URINALYSIS, ROUTINE W REFLEX MICROSCOPIC - Abnormal; Notable for the following components:   Glucose, UA >=500 (*)    Hgb  urine dipstick MODERATE (*)    Ketones, ur 20 (*)    Protein, ur >=300 (*)    RBC / HPF >50 (*)    All other components within normal limits  PREGNANCY, URINE    EKG None  Radiology No results found.  Procedures Procedures (including critical care time)  Medications Ordered in ED Medications  sodium chloride 0.9 % bolus 1,000 mL (1,000 mLs Intravenous New Bag/Given 04/23/19 0822)  droperidol (INAPSINE) 2.5 MG/ML injection 2.5 mg (2.5 mg Intravenous Given 04/23/19 0819)  potassium chloride SA (KLOR-CON) CR tablet 40 mEq (40 mEq Oral Given 04/23/19 0936)    ED Course  I have reviewed the triage vital signs and the nursing notes.  Pertinent labs & imaging results that were available during my care of the patient were reviewed by me and considered in my medical decision making (see chart for details).    MDM Rules/Calculators/A&P                      PT received IV medication.  Pt given a liter of iv fluids  Pt able to tolerate oral fluids Pt reports she feels better and wants to go home.  Pt has not had am insulin.  Pt reports she will resume home medications.  Final Clinical Impression(s) / ED Diagnoses Final diagnoses:  Vomiting without nausea, intractability of vomiting not specified, unspecified vomiting type    Rx / DC Orders ED Discharge Orders    None    An After Visit Summary was printed and given to the patient.    Fransico Meadow, Hershal Coria 04/23/19 1501    Dorie Rank, MD 04/24/19 [phone]

## 2019-04-23 NOTE — ED Triage Notes (Signed)
Pt c/o back pain and nausea since yesterday.

## 2019-04-23 NOTE — ED Notes (Signed)
Patient given diet ginger ale.

## 2019-04-23 NOTE — Discharge Instructions (Signed)
Drink plenty of fluids.  Take medications as directed,  Return if any problems.

## 2019-04-24 ENCOUNTER — Other Ambulatory Visit: Payer: Self-pay

## 2019-04-24 ENCOUNTER — Encounter (HOSPITAL_COMMUNITY): Payer: Self-pay

## 2019-04-24 ENCOUNTER — Emergency Department (HOSPITAL_COMMUNITY)
Admission: EM | Admit: 2019-04-24 | Discharge: 2019-04-24 | Disposition: A | Discharge status: Home / Self Care | Payer: 59 | Attending: Emergency Medicine | Admitting: Emergency Medicine

## 2019-04-24 DIAGNOSIS — E109 Type 1 diabetes mellitus without complications: Secondary | ICD-10-CM | POA: Insufficient documentation

## 2019-04-24 DIAGNOSIS — Z79899 Other long term (current) drug therapy: Secondary | ICD-10-CM | POA: Diagnosis not present

## 2019-04-24 DIAGNOSIS — I10 Essential (primary) hypertension: Secondary | ICD-10-CM | POA: Insufficient documentation

## 2019-04-24 DIAGNOSIS — R112 Nausea with vomiting, unspecified: Secondary | ICD-10-CM

## 2019-04-24 LAB — CBC WITH DIFFERENTIAL/PLATELET
Abs Immature Granulocytes: 0.03 10*3/uL (ref 0.00–0.07)
Basophils Absolute: 0 10*3/uL (ref 0.0–0.1)
Basophils Relative: 1 %
Eosinophils Absolute: 0 10*3/uL (ref 0.0–0.5)
Eosinophils Relative: 0 %
HCT: 32 % — ABNORMAL LOW (ref 36.0–46.0)
Hemoglobin: 10.7 g/dL — ABNORMAL LOW (ref 12.0–15.0)
Immature Granulocytes: 1 %
Lymphocytes Relative: 19 %
Lymphs Abs: 1.1 10*3/uL (ref 0.7–4.0)
MCH: 27.6 pg (ref 26.0–34.0)
MCHC: 33.4 g/dL (ref 30.0–36.0)
MCV: 82.7 fL (ref 80.0–100.0)
Monocytes Absolute: 0.2 10*3/uL (ref 0.1–1.0)
Monocytes Relative: 3 %
Neutro Abs: 4.7 10*3/uL (ref 1.7–7.7)
Neutrophils Relative %: 76 %
Platelets: 245 10*3/uL (ref 150–400)
RBC: 3.87 MIL/uL (ref 3.87–5.11)
RDW: 12.6 % (ref 11.5–15.5)
WBC: 6.1 10*3/uL (ref 4.0–10.5)
nRBC: 0 % (ref 0.0–0.2)

## 2019-04-24 LAB — COMPREHENSIVE METABOLIC PANEL
ALT: 31 U/L (ref 0–44)
AST: 26 U/L (ref 15–41)
Albumin: 3.9 g/dL (ref 3.5–5.0)
Alkaline Phosphatase: 95 U/L (ref 38–126)
Anion gap: 15 (ref 5–15)
BUN: 17 mg/dL (ref 6–20)
CO2: 28 mmol/L (ref 22–32)
Calcium: 9 mg/dL (ref 8.9–10.3)
Chloride: 91 mmol/L — ABNORMAL LOW (ref 98–111)
Creatinine, Ser: 0.96 mg/dL (ref 0.44–1.00)
GFR calc Af Amer: >60 mL/min (ref >60)
GFR calc non Af Amer: >60 mL/min (ref >60)
Glucose, Bld: 392 mg/dL — ABNORMAL HIGH (ref 70–99)
Potassium: 3.3 mmol/L — ABNORMAL LOW (ref 3.5–5.1)
Sodium: 134 mmol/L — ABNORMAL LOW (ref 135–145)
Total Bilirubin: 1.1 mg/dL (ref 0.3–1.2)
Total Protein: 8.4 g/dL — ABNORMAL HIGH (ref 6.5–8.1)

## 2019-04-24 LAB — LIPASE, BLOOD: Lipase: 19 U/L (ref 11–51)

## 2019-04-24 LAB — CBG MONITORING, ED: Glucose-Capillary: 250 mg/dL — ABNORMAL HIGH (ref 70–99)

## 2019-04-24 MED ORDER — MORPHINE SULFATE (PF) 4 MG/ML IV SOLN
5.0000 mg | Freq: Once | INTRAVENOUS | Status: AC
  Administered 2019-04-24: 5 mg via INTRAVENOUS
  Filled 2019-04-24: qty 2

## 2019-04-24 MED ORDER — SODIUM CHLORIDE 0.9 % IV BOLUS
2000.0000 mL | Freq: Once | INTRAVENOUS | Status: AC
  Administered 2019-04-24: 2000 mL via INTRAVENOUS

## 2019-04-24 MED ORDER — LORAZEPAM 2 MG/ML IJ SOLN
0.5000 mg | Freq: Once | INTRAMUSCULAR | Status: AC
  Administered 2019-04-24: 0.5 mg via INTRAVENOUS
  Filled 2019-04-24: qty 1

## 2019-04-24 MED ORDER — INSULIN ASPART 100 UNIT/ML ~~LOC~~ SOLN
8.0000 [IU] | Freq: Once | SUBCUTANEOUS | Status: AC
  Administered 2019-04-24: 8 [IU] via SUBCUTANEOUS
  Filled 2019-04-24: qty 0.08

## 2019-04-24 MED ORDER — SODIUM CHLORIDE 0.9 % IV SOLN: INTRAVENOUS | Status: DC

## 2019-04-24 MED ORDER — POTASSIUM CHLORIDE CRYS ER 20 MEQ PO TBCR
40.0000 meq | EXTENDED_RELEASE_TABLET | Freq: Once | ORAL | Status: AC
  Administered 2019-04-24: 40 meq via ORAL
  Filled 2019-04-24: qty 2

## 2019-04-24 MED ORDER — DIPHENHYDRAMINE HCL 50 MG/ML IJ SOLN
12.5000 mg | Freq: Once | INTRAMUSCULAR | Status: AC
  Administered 2019-04-24: 12.5 mg via INTRAVENOUS
  Filled 2019-04-24: qty 1

## 2019-04-24 MED ORDER — DROPERIDOL 2.5 MG/ML IJ SOLN
2.5000 mg | Freq: Once | INTRAMUSCULAR | Status: AC
  Administered 2019-04-24: 2.5 mg via INTRAVENOUS
  Filled 2019-04-24: qty 2

## 2019-04-24 NOTE — ED Triage Notes (Addendum)
C/O recurrent N/V and back pain  10/10 pain

## 2019-04-24 NOTE — ED Provider Notes (Signed)
[facility] DEPT Provider Note   CSN: [id number] Arrival date & time: 04/24/19  1232     History Chief Complaint  Patient presents with  . Nausea  . Emesis    [name] is a 29 y.o. female.  29 year old female with history of gastroparesis secondary to diabetes who was seen here yesterday for similar symptoms presents with emesis x7 without fever or chills.  She notes diffuse abdominal cramping going to her back.  This is similar to her prior episodes of gastroparesis.  Has been seen in the ED multiple times for this.  Denies any fever or chills.  Has had some watery diarrhea.  Blood sugar has been above 300 at home.  Has used her home promethazine without relief.        Past Medical History:  Diagnosis Date  . Allergy   . Anemia   . Anxiety   . Blood transfusion without reported diagnosis    Dec 2018  . Cataract    right eye  . Depression   . Diabetes type 1, uncontrolled ([place]) 11/14/2011   Since age 80  . Fibromyalgia   . Gastroparesis   . GERD (gastroesophageal reflux disease)   . Hypertension   . Infection    UTI April 2016    Patient Active Problem List   Diagnosis Date Noted  . Contraception management 08/06/2018  . Cyclical vomiting syndrome 06/07/2018  . Diabetic gastroparesis associated with type 1 diabetes mellitus ([place]) 06/04/2018  . Anxiety 05/31/2018  . Peripheral edema 05/31/2018  . Normocytic anemia 07/10/2017  . GERD (gastroesophageal reflux disease) 06/23/2017  . 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 Diabetes mellitus type 1 ([place]) 04/28/2013  . Protein-calorie malnutrition, severe ([place]) 04/28/2013  . Hypertension associated with diabetes ([place]) 11/14/2011  . Chronic abdominal pain 09/18/2011    Past Surgical History:  Procedure Laterality Date  . ANKLE SURGERY    . CHOLECYSTECTOMY  11/15/2011   Procedure: LAPAROSCOPIC CHOLECYSTECTOMY WITH INTRAOPERATIVE  CHOLANGIOGRAM;  Surgeon: [name], MD;  [facility];  Service: General;  Laterality: N/A;  . COLONOSCOPY    . COLONOSCOPY WITH PROPOFOL N/A 06/27/2017   Procedure: COLONOSCOPY WITH PROPOFOL;  Surgeon: Milus Banister, MD;  [facility];  Service: Endoscopy;  Laterality: N/A;  . ESOPHAGOGASTRODUODENOSCOPY  12/03/2011   Procedure: ESOPHAGOGASTRODUODENOSCOPY (EGD);  Surgeon: Beryle Beams, MD;  Location: [facility];  Service: Endoscopy;  Laterality: N/A;  . FLEXIBLE SIGMOIDOSCOPY N/A 03/10/2017   Procedure: FLEXIBLE SIGMOIDOSCOPY;  Surgeon: Carol Ada, MD;  [facility];  Service: Endoscopy;  Laterality: N/A;  . INCISION AND DRAINAGE PERIRECTAL ABSCESS N/A 03/01/2017   Procedure: IRRIGATION AND DEBRIDEMENT PERIRECTAL ABSCESS;  Surgeon: Alphonsa Overall, MD;  [facility];  Service: General;  Laterality: N/A;  . IRRIGATION AND DEBRIDEMENT BUTTOCKS N/A 03/23/2017   Procedure: IRRIGATION AND DEBRIDEMENT BUTTOCKS, SETON PLACEMENT;  Surgeon: [name], MD;  [facility];  Service: General;  Laterality: N/A;  . LAPAROSCOPY  11/23/2011   Procedure: LAPAROSCOPY DIAGNOSTIC;  Surgeon: Edward Jolly, MD;  [facility];  Service: General;  Laterality: N/A;  . SIGMOIDOSCOPY    . UPPER GASTROINTESTINAL ENDOSCOPY    . WISDOM TOOTH EXTRACTION       OB History    Gravida  1   Para  1   Term      Preterm  1   AB      Living  1  SAB      TAB      Ectopic      Multiple  0   Live Births  1           Family History  Problem Relation Age of Onset  . Diabetes Mother   . Hypertension Father   . Colon cancer Paternal Grandmother        pt thinks PGM was dx in her 38's  . Diabetes Paternal Grandmother   . Diabetes Maternal Grandmother   . Diabetes Maternal Grandfather   . Diabetes Paternal Grandfather   . Diabetes Other   . Esophageal cancer Neg Hx   . Liver cancer Neg Hx   . Pancreatic cancer Neg Hx   . Stomach cancer Neg Hx     . Rectal cancer Neg Hx     Social History   Tobacco Use  . Smoking status: Never Smoker  . Smokeless tobacco: Never Used  Substance Use Topics  . Alcohol use: No    Alcohol/week: 0.0 standard drinks  . Drug use: Yes    Types: Marijuana    Home Medications Prior to Admission medications   Medication Sig Start Date End Date Taking? Authorizing Provider  Continuous Blood Gluc Receiver (DEXCOM G6 RECEIVER) DEVI USE AS DIRECTED TO MONITOR GLUCOSE LEVELS 10/29/18   [provider]  Continuous Blood Gluc Sensor (DEXCOM G6 SENSOR) MISC 1 each by Does not apply route See admin instructions. every 10 days. 12/31/18   Renato Shin, MD  Continuous Blood Gluc Transmit (DEXCOM G6 TRANSMITTER) MISC USE AS DIRECTED TO MONITOR GLUCOSE LEVELS 10/29/18   [provider]  cyclobenzaprine (FLEXERIL) 10 MG tablet TAKE 1 TABLET(10 MG) BY MOUTH THREE TIMES DAILY AS NEEDED FOR MUSCLE SPASMS Patient taking differently: Take 10 mg by mouth 3 (three) times daily as needed for muscle spasms.  04/19/19   [name], MD  dicyclomine (BENTYL) 20 MG tablet Take 1 tablet (20 mg total) by mouth 3 (three) times daily as needed for spasms. 03/16/19   [name], MD  furosemide (LASIX) 40 MG tablet TAKE 1 TABLET BY MOUTH  DAILY Patient taking differently: Take 40 mg by mouth daily.  11/11/18   Armbruster, Carlota Raspberry, MD  hydrALAZINE (APRESOLINE) 50 MG tablet Take 1 tablet (50 mg total) by mouth every 6 (six) hours. 10/23/18   Swayze, Ava, DO  Insulin Glargine (LANTUS SOLOSTAR) 100 UNIT/ML Solostar Pen Inject 10 Units into the skin at bedtime. And pen needles 4/day Patient taking differently: Inject 12 Units into the skin at bedtime. And pen needles 4/day 12/31/18   Renato Shin, MD  insulin lispro (HUMALOG KWIKPEN) 100 UNIT/ML KwikPen Inject 0.06 mLs (6 Units total) into the skin 3 (three) times daily. Patient taking differently: Inject 5 Units into the skin 3 (three) times daily.  12/31/18   Renato Shin, MD  LORazepam (ATIVAN) 0.5 MG tablet Take 1 tablet (0.5 mg total) by mouth 2 (two) times daily as needed for anxiety. 04/05/19   [name], MD  metoprolol tartrate (LOPRESSOR) 25 MG tablet Take 1 tablet (25 mg total) by mouth 2 (two) times daily. 10/23/18   Swayze, Ava, DO  mirtazapine (REMERON) 30 MG tablet TAKE 1 TABLET BY MOUTH AT  BEDTIME Patient taking differently: Take 30 mg by mouth at bedtime.  04/19/19   [name], MD  pantoprazole (PROTONIX) 40 MG tablet TAKE 1 TABLET BY MOUTH  DAILY Patient taking differently: Take 40 mg by mouth  daily.  10/22/18   Armbruster, Carlota Raspberry, MD  promethazine (PHENERGAN) 25 MG tablet Take 1 tablet (25 mg total) by mouth every 6 (six) hours as needed for nausea or vomiting. 03/14/19   Drenda Freeze, MD  PROMETHEGAN 12.5 MG suppository Place 12.5 mg rectally every 8 (eight) hours as needed for nausea or vomiting.  03/14/19   [provider]  traMADol (ULTRAM) 50 MG tablet TAKE 1 TABLET(50 MG) BY MOUTH EVERY 6 HOURS AS NEEDED FOR MODERATE PAIN OR SEVERE PAIN Patient taking differently: Take 50 mg by mouth every 6 (six) hours as needed for moderate pain or severe pain.  03/14/19   [name], MD  metoCLOPramide (REGLAN) 10 MG tablet Take 1 tablet (10 mg total) by mouth every 6 (six) hours as needed for nausea or vomiting. Patient not taking: Reported on 04/23/2019 02/20/18 04/23/19  Shelly Coss, MD  potassium chloride 20 MEQ TBCR Take 20 mEq by mouth 2 (two) times daily. Patient not taking: Reported on 03/31/2019 10/17/18 04/23/19  Isla Pence, MD  potassium chloride SA (KLOR-CON) 20 MEQ tablet Take 1 tablet (20 mEq total) by mouth daily. Patient not taking: Reported on 03/31/2019 03/14/19 04/23/19  Drenda Freeze, MD    Allergies    Other and Lactose intolerance (gi)  Review of Systems   Review of Systems  All other systems reviewed and are negative.   Physical Exam Updated Vital Signs BP (!) 203/121 (BP Location:  Left Arm)   Pulse (!) 109   Temp 97.6 F (36.4 C) (Oral)   Resp 14   SpO2 97%   Physical Exam Vitals and nursing note reviewed.  Constitutional:      General: She is not in acute distress.    Appearance: Normal appearance. She is well-developed. She is not toxic-appearing.  HENT:     Head: Normocephalic and atraumatic.  Eyes:     General: Lids are normal.     Conjunctiva/sclera: Conjunctivae normal.     Pupils: Pupils are equal, round, and reactive to light.  Neck:     Thyroid: No thyroid mass.     Trachea: No tracheal deviation.  Cardiovascular:     Rate and Rhythm: Normal rate and regular rhythm.     Heart sounds: Normal heart sounds. No murmur. No gallop.   Pulmonary:     Effort: Pulmonary effort is normal. No respiratory distress.     Breath sounds: Normal breath sounds. No stridor. No decreased breath sounds, wheezing, rhonchi or rales.  Abdominal:     General: Bowel sounds are normal. There is no distension.     Palpations: Abdomen is soft.     Tenderness: There is generalized abdominal tenderness. There is no rebound.  Musculoskeletal:        General: No tenderness. Normal range of motion.     Cervical back: Normal range of motion and neck supple.  Skin:    General: Skin is warm and dry.     Findings: No abrasion or rash.  Neurological:     Mental Status: She is alert and oriented to person, place, and time.     GCS: GCS eye subscore is 4. GCS verbal subscore is 5. GCS motor subscore is 6.     Cranial Nerves: No cranial nerve deficit.     Sensory: No sensory deficit.  Psychiatric:        Attention and Perception: Attention normal.        Mood and Affect: Mood is anxious.  Speech: Speech normal.        Behavior: Behavior normal.     ED Results / Procedures / Treatments   Labs (all labs ordered are listed, but only abnormal results are displayed) Labs Reviewed  CBC WITH DIFFERENTIAL/PLATELET  COMPREHENSIVE METABOLIC PANEL  LIPASE, BLOOD  BLOOD GAS,  VENOUS    EKG None  Radiology No results found.  Procedures Procedures (including critical care time)  Medications Ordered in ED Medications  sodium chloride 0.9 % bolus 2,000 mL (has no administration in time range)  0.9 %  sodium chloride infusion (has no administration in time range)  droperidol (INAPSINE) 2.5 MG/ML injection 2.5 mg (has no administration in time range)  diphenhydrAMINE (BENADRYL) injection 12.5 mg (has no administration in time range)  LORazepam (ATIVAN) injection 0.5 mg (has no administration in time range)  morphine 4 MG/ML injection 5 mg (has no administration in time range)    ED Course  I have reviewed the triage vital signs and the nursing notes.  Pertinent labs & imaging results that were available during my care of the patient were reviewed by me and considered in my medical decision making (see chart for details).    MDM Rules/Calculators/A&P                      Patient had hyperglycemia up to 392 here that was treated with IV fluids as well as insulin.  Repeat blood sugar was improved to 250.  Mild hypokalemia noted will be treated with oral potassium.  Patient's anion gap is 15.  No signs of ketoacidosis.  Will discharge home Final Clinical Impression(s) / ED Diagnoses Final diagnoses:  None    Rx / DC Orders ED Discharge Orders    None       Lacretia Leigh, MD 04/24/19 1625

## 2019-04-24 NOTE — ED Notes (Signed)
Patient aware that urine sample is needed. Unable to provide sample at this time. Will ask again later.

## 2019-04-25 ENCOUNTER — Emergency Department (HOSPITAL_COMMUNITY)
Admission: EM | Admit: 2019-04-25 | Discharge: 2019-04-25 | Disposition: A | Discharge status: Home / Self Care | Payer: 59 | Attending: Emergency Medicine | Admitting: Emergency Medicine

## 2019-04-25 ENCOUNTER — Encounter (HOSPITAL_COMMUNITY): Payer: Self-pay

## 2019-04-25 ENCOUNTER — Other Ambulatory Visit: Payer: Self-pay

## 2019-04-25 DIAGNOSIS — R112 Nausea with vomiting, unspecified: Secondary | ICD-10-CM | POA: Diagnosis not present

## 2019-04-25 DIAGNOSIS — Z5321 Procedure and treatment not carried out due to patient leaving prior to being seen by health care provider: Secondary | ICD-10-CM | POA: Diagnosis not present

## 2019-04-25 DIAGNOSIS — M546 Pain in thoracic spine: Secondary | ICD-10-CM | POA: Diagnosis not present

## 2019-04-25 LAB — BLOOD GAS, VENOUS
Acid-Base Excess: 3.7 mmol/L — ABNORMAL HIGH (ref 0.0–2.0)
Bicarbonate: 29.4 mmol/L — ABNORMAL HIGH (ref 20.0–28.0)
FIO2: 100
O2 Saturation: 60 %
Patient temperature: 98.6
pCO2, Ven: 52.1 mmHg (ref 44.0–60.0)
pH, Ven: 7.37 (ref 7.250–7.430)
pO2, Ven: 35 mmHg (ref 32.0–45.0)

## 2019-04-25 NOTE — ED Triage Notes (Signed)
Patient arrived stating she has had nausea, vomiting and mid back pain over the last 3-4 days. Reports being seen here yesterday for same. Taking at home medications with little relief.

## 2019-04-27 ENCOUNTER — Other Ambulatory Visit: Payer: Self-pay | Admitting: Family Medicine

## 2019-04-28 NOTE — Telephone Encounter (Signed)
Last OV 01/14/19 Last refill 03/14/19 #14/0 Next OV not scheduled

## 2019-04-29 ENCOUNTER — Other Ambulatory Visit: Payer: Self-pay | Admitting: Family Medicine

## 2019-04-30 ENCOUNTER — Other Ambulatory Visit: Payer: Self-pay

## 2019-04-30 ENCOUNTER — Encounter (HOSPITAL_COMMUNITY): Payer: Self-pay | Admitting: Emergency Medicine

## 2019-04-30 ENCOUNTER — Observation Stay (HOSPITAL_COMMUNITY)
Admission: EM | Admit: 2019-04-30 | Discharge: 2019-05-01 | Disposition: A | Discharge status: Home / Self Care | Payer: 59 | Attending: Family Medicine | Admitting: Family Medicine

## 2019-04-30 DIAGNOSIS — Z20822 Contact with and (suspected) exposure to covid-19: Secondary | ICD-10-CM | POA: Diagnosis not present

## 2019-04-30 DIAGNOSIS — E109 Type 1 diabetes mellitus without complications: Secondary | ICD-10-CM | POA: Diagnosis present

## 2019-04-30 DIAGNOSIS — E876 Hypokalemia: Secondary | ICD-10-CM | POA: Diagnosis not present

## 2019-04-30 DIAGNOSIS — F329 Major depressive disorder, single episode, unspecified: Secondary | ICD-10-CM | POA: Diagnosis not present

## 2019-04-30 DIAGNOSIS — Z794 Long term (current) use of insulin: Secondary | ICD-10-CM | POA: Insufficient documentation

## 2019-04-30 DIAGNOSIS — Z833 Family history of diabetes mellitus: Secondary | ICD-10-CM | POA: Diagnosis not present

## 2019-04-30 DIAGNOSIS — D649 Anemia, unspecified: Secondary | ICD-10-CM | POA: Diagnosis not present

## 2019-04-30 DIAGNOSIS — R112 Nausea with vomiting, unspecified: Secondary | ICD-10-CM | POA: Diagnosis present

## 2019-04-30 DIAGNOSIS — Z79899 Other long term (current) drug therapy: Secondary | ICD-10-CM | POA: Insufficient documentation

## 2019-04-30 DIAGNOSIS — E1043 Type 1 diabetes mellitus with diabetic autonomic (poly)neuropathy: Secondary | ICD-10-CM | POA: Diagnosis not present

## 2019-04-30 DIAGNOSIS — K219 Gastro-esophageal reflux disease without esophagitis: Secondary | ICD-10-CM | POA: Diagnosis not present

## 2019-04-30 DIAGNOSIS — M797 Fibromyalgia: Secondary | ICD-10-CM | POA: Diagnosis not present

## 2019-04-30 DIAGNOSIS — F419 Anxiety disorder, unspecified: Secondary | ICD-10-CM | POA: Insufficient documentation

## 2019-04-30 DIAGNOSIS — M545 Low back pain: Secondary | ICD-10-CM | POA: Diagnosis not present

## 2019-04-30 DIAGNOSIS — R000 Tachycardia, unspecified: Secondary | ICD-10-CM | POA: Insufficient documentation

## 2019-04-30 DIAGNOSIS — K3184 Gastroparesis: Secondary | ICD-10-CM | POA: Insufficient documentation

## 2019-04-30 DIAGNOSIS — I10 Essential (primary) hypertension: Secondary | ICD-10-CM | POA: Diagnosis not present

## 2019-04-30 DIAGNOSIS — G8929 Other chronic pain: Secondary | ICD-10-CM | POA: Diagnosis present

## 2019-04-30 DIAGNOSIS — E1065 Type 1 diabetes mellitus with hyperglycemia: Secondary | ICD-10-CM | POA: Insufficient documentation

## 2019-04-30 DIAGNOSIS — E1143 Type 2 diabetes mellitus with diabetic autonomic (poly)neuropathy: Secondary | ICD-10-CM

## 2019-04-30 DIAGNOSIS — R1110 Vomiting, unspecified: Secondary | ICD-10-CM | POA: Diagnosis present

## 2019-04-30 DIAGNOSIS — F1290 Cannabis use, unspecified, uncomplicated: Secondary | ICD-10-CM | POA: Diagnosis present

## 2019-04-30 DIAGNOSIS — I160 Hypertensive urgency: Secondary | ICD-10-CM

## 2019-04-30 LAB — SARS CORONAVIRUS 2 (TAT 6-24 HRS): SARS Coronavirus 2: NEGATIVE

## 2019-04-30 LAB — POTASSIUM
Potassium: 2.5 mmol/L — CL (ref 3.5–5.1)
Potassium: 3.8 mmol/L (ref 3.5–5.1)

## 2019-04-30 LAB — URINALYSIS, ROUTINE W REFLEX MICROSCOPIC
Bilirubin Urine: NEGATIVE
Glucose, UA: >=500 mg/dL — AB
Ketones, ur: 5 mg/dL — AB
Leukocytes,Ua: NEGATIVE
Nitrite: NEGATIVE
Protein, ur: >=300 mg/dL — AB
Specific Gravity, Urine: 1.017 (ref 1.005–1.030)
pH: 7 (ref 5.0–8.0)

## 2019-04-30 LAB — COMPREHENSIVE METABOLIC PANEL
ALT: 20 U/L (ref 0–44)
AST: 21 U/L (ref 15–41)
Albumin: 3.4 g/dL — ABNORMAL LOW (ref 3.5–5.0)
Alkaline Phosphatase: 78 U/L (ref 38–126)
Anion gap: 12 (ref 5–15)
BUN: 11 mg/dL (ref 6–20)
CO2: 33 mmol/L — ABNORMAL HIGH (ref 22–32)
Calcium: 8.5 mg/dL — ABNORMAL LOW (ref 8.9–10.3)
Chloride: 94 mmol/L — ABNORMAL LOW (ref 98–111)
Creatinine, Ser: 0.86 mg/dL (ref 0.44–1.00)
GFR calc Af Amer: >60 mL/min (ref >60)
GFR calc non Af Amer: >60 mL/min (ref >60)
Glucose, Bld: 244 mg/dL — ABNORMAL HIGH (ref 70–99)
Potassium: 2.6 mmol/L — CL (ref 3.5–5.1)
Sodium: 139 mmol/L (ref 135–145)
Total Bilirubin: 0.8 mg/dL (ref 0.3–1.2)
Total Protein: 7.1 g/dL (ref 6.5–8.1)

## 2019-04-30 LAB — CBC
HCT: 30.3 % — ABNORMAL LOW (ref 36.0–46.0)
Hemoglobin: 9.7 g/dL — ABNORMAL LOW (ref 12.0–15.0)
MCH: 27.1 pg (ref 26.0–34.0)
MCHC: 32 g/dL (ref 30.0–36.0)
MCV: 84.6 fL (ref 80.0–100.0)
Platelets: 284 10*3/uL (ref 150–400)
RBC: 3.58 MIL/uL — ABNORMAL LOW (ref 3.87–5.11)
RDW: 12.6 % (ref 11.5–15.5)
WBC: 5.5 10*3/uL (ref 4.0–10.5)
nRBC: 0 % (ref 0.0–0.2)

## 2019-04-30 LAB — GLUCOSE, CAPILLARY
Glucose-Capillary: 220 mg/dL — ABNORMAL HIGH (ref 70–99)
Glucose-Capillary: 142 mg/dL — ABNORMAL HIGH (ref 70–99)
Glucose-Capillary: 164 mg/dL — ABNORMAL HIGH (ref 70–99)
Glucose-Capillary: 134 mg/dL — ABNORMAL HIGH (ref 70–99)

## 2019-04-30 LAB — I-STAT BETA HCG BLOOD, ED (MC, WL, AP ONLY): I-stat hCG, quantitative: <5 m[IU]/mL (ref <5)

## 2019-04-30 LAB — MAGNESIUM: Magnesium: 1.6 mg/dL — ABNORMAL LOW (ref 1.7–2.4)

## 2019-04-30 LAB — HIV ANTIBODY (ROUTINE TESTING W REFLEX): HIV Screen 4th Generation wRfx: NONREACTIVE

## 2019-04-30 LAB — LIPASE, BLOOD: Lipase: 17 U/L (ref 11–51)

## 2019-04-30 LAB — HEMOGLOBIN A1C
Hgb A1c MFr Bld: 7.8 % — ABNORMAL HIGH (ref 4.8–5.6)
Mean Plasma Glucose: 177.16 mg/dL

## 2019-04-30 MED ORDER — POTASSIUM CHLORIDE CRYS ER 20 MEQ PO TBCR
40.0000 meq | EXTENDED_RELEASE_TABLET | Freq: Once | ORAL | Status: AC
  Administered 2019-04-30: 40 meq via ORAL
  Filled 2019-04-30: qty 2

## 2019-04-30 MED ORDER — MIRTAZAPINE 15 MG PO TABS
30.0000 mg | ORAL_TABLET | Freq: Every day | ORAL | Status: DC
  Administered 2019-04-30: 30 mg via ORAL
  Filled 2019-04-30: qty 2

## 2019-04-30 MED ORDER — SODIUM CHLORIDE 0.9 % IV BOLUS
500.0000 mL | Freq: Once | INTRAVENOUS | Status: AC
  Administered 2019-04-30: 500 mL via INTRAVENOUS

## 2019-04-30 MED ORDER — LORAZEPAM 0.5 MG PO TABS
0.5000 mg | ORAL_TABLET | Freq: Two times a day (BID) | ORAL | Status: DC | PRN
  Administered 2019-04-30: 0.5 mg via ORAL
  Filled 2019-04-30: qty 1

## 2019-04-30 MED ORDER — METOPROLOL TARTRATE 5 MG/5ML IV SOLN
5.0000 mg | Freq: Once | INTRAVENOUS | Status: AC
  Administered 2019-04-30: 5 mg via INTRAVENOUS
  Filled 2019-04-30: qty 5

## 2019-04-30 MED ORDER — LORAZEPAM 2 MG/ML IJ SOLN
1.0000 mg | Freq: Once | INTRAMUSCULAR | Status: AC
  Administered 2019-04-30: 1 mg via INTRAVENOUS
  Filled 2019-04-30: qty 1

## 2019-04-30 MED ORDER — INSULIN ASPART 100 UNIT/ML ~~LOC~~ SOLN: 0.0000 [IU] | Freq: Every day | SUBCUTANEOUS | Status: DC

## 2019-04-30 MED ORDER — PANTOPRAZOLE SODIUM 40 MG PO TBEC
40.0000 mg | DELAYED_RELEASE_TABLET | Freq: Every day | ORAL | Status: DC
  Administered 2019-04-30 – 2019-05-01 (×2): 40 mg via ORAL
  Filled 2019-04-30 (×2): qty 1

## 2019-04-30 MED ORDER — PROMETHAZINE HCL 25 MG/ML IJ SOLN
25.0000 mg | Freq: Once | INTRAMUSCULAR | Status: AC
  Administered 2019-04-30: 25 mg via INTRAVENOUS
  Filled 2019-04-30: qty 1

## 2019-04-30 MED ORDER — POTASSIUM CHLORIDE 10 MEQ/100ML IV SOLN
10.0000 meq | INTRAVENOUS | Status: AC
  Administered 2019-04-30 (×4): 10 meq via INTRAVENOUS
  Filled 2019-04-30 (×4): qty 100

## 2019-04-30 MED ORDER — LABETALOL HCL 5 MG/ML IV SOLN
10.0000 mg | INTRAVENOUS | Status: DC | PRN
  Administered 2019-04-30: 10 mg via INTRAVENOUS
  Filled 2019-04-30: qty 4

## 2019-04-30 MED ORDER — METOPROLOL TARTRATE 25 MG PO TABS
25.0000 mg | ORAL_TABLET | Freq: Two times a day (BID) | ORAL | Status: DC
  Administered 2019-04-30 – 2019-05-01 (×3): 25 mg via ORAL
  Filled 2019-04-30 (×3): qty 1

## 2019-04-30 MED ORDER — INSULIN GLARGINE 100 UNIT/ML ~~LOC~~ SOLN
12.0000 [IU] | Freq: Every day | SUBCUTANEOUS | Status: DC
  Administered 2019-04-30: 12 [IU] via SUBCUTANEOUS
  Filled 2019-04-30 (×2): qty 0.12

## 2019-04-30 MED ORDER — HYDRALAZINE HCL 50 MG PO TABS
50.0000 mg | ORAL_TABLET | Freq: Four times a day (QID) | ORAL | Status: DC
  Administered 2019-04-30 – 2019-05-01 (×5): 50 mg via ORAL
  Filled 2019-04-30 (×2): qty 1
  Filled 2019-04-30: qty 5
  Filled 2019-04-30: qty 1
  Filled 2019-04-30 (×2): qty 5

## 2019-04-30 MED ORDER — POTASSIUM CHLORIDE 10 MEQ/100ML IV SOLN
10.0000 meq | INTRAVENOUS | Status: AC
  Administered 2019-04-30 (×2): 10 meq via INTRAVENOUS
  Filled 2019-04-30 (×2): qty 100

## 2019-04-30 MED ORDER — SODIUM CHLORIDE 0.9 % IV SOLN: INTRAVENOUS | Status: DC

## 2019-04-30 MED ORDER — MAGNESIUM SULFATE 2 GM/50ML IV SOLN
2.0000 g | Freq: Once | INTRAVENOUS | Status: AC
  Administered 2019-04-30: 2 g via INTRAVENOUS
  Filled 2019-04-30: qty 50

## 2019-04-30 MED ORDER — MORPHINE SULFATE (PF) 2 MG/ML IV SOLN
2.0000 mg | INTRAVENOUS | Status: DC | PRN
  Administered 2019-04-30 – 2019-05-01 (×7): 2 mg via INTRAVENOUS
  Filled 2019-04-30 (×7): qty 1

## 2019-04-30 MED ORDER — INSULIN ASPART 100 UNIT/ML ~~LOC~~ SOLN
0.0000 [IU] | Freq: Three times a day (TID) | SUBCUTANEOUS | Status: DC
  Administered 2019-04-30: 2 [IU] via SUBCUTANEOUS
  Administered 2019-04-30: 3 [IU] via SUBCUTANEOUS
  Administered 2019-05-01: 1 [IU] via SUBCUTANEOUS

## 2019-04-30 MED ORDER — PROMETHAZINE HCL 25 MG/ML IJ SOLN: 12.5000 mg | Freq: Four times a day (QID) | INTRAMUSCULAR | Status: DC | PRN

## 2019-04-30 MED ORDER — SODIUM CHLORIDE 0.9% FLUSH
3.0000 mL | Freq: Once | INTRAVENOUS | Status: AC
  Administered 2019-04-30: 3 mL via INTRAVENOUS

## 2019-04-30 MED ORDER — INSULIN LISPRO (1 UNIT DIAL) 100 UNIT/ML (KWIKPEN): 6.0000 [IU] | PEN_INJECTOR | Freq: Three times a day (TID) | SUBCUTANEOUS | Status: DC

## 2019-04-30 MED ORDER — METOCLOPRAMIDE HCL 5 MG/ML IJ SOLN
10.0000 mg | Freq: Four times a day (QID) | INTRAMUSCULAR | Status: DC
  Administered 2019-04-30 – 2019-05-01 (×3): 10 mg via INTRAVENOUS
  Filled 2019-04-30 (×4): qty 2

## 2019-04-30 MED ORDER — ENOXAPARIN SODIUM 40 MG/0.4ML ~~LOC~~ SOLN
40.0000 mg | SUBCUTANEOUS | Status: DC
  Administered 2019-04-30: 40 mg via SUBCUTANEOUS
  Filled 2019-04-30 (×2): qty 0.4

## 2019-04-30 MED ORDER — INSULIN ASPART 100 UNIT/ML ~~LOC~~ SOLN: 6.0000 [IU] | Freq: Three times a day (TID) | SUBCUTANEOUS | Status: DC

## 2019-04-30 NOTE — ED Notes (Signed)
Pt crying while writer obtaining vital signs in triage.

## 2019-04-30 NOTE — ED Provider Notes (Signed)
[facility] DEPT Provider Note   CSN: [id number] Arrival date & time: 04/30/19  7096     History Chief Complaint  Patient presents with  . Back Pain  . Emesis    [name] is a 29 y.o. female with a hx of insulin-dependent diabetes, chronic abdominal pain, hypertension, gastroparesis with cyclic vomiting syndrome presents to the Emergency Department complaining of gradual, persistent, progressively worsening lysed abdominal pain with associated nausea and vomiting onset 5 days ago.  Patient reports intractable vomiting despite usage of her home medications including Phenergan.  She reports the trigger for this episode was likely the onset of her menstrual cycle.  She denies fevers or chills.  No alleviating factors.  Patient additionally complaining of generalized back pain from persistent vomiting.  No numbness, weakness, loss of bowel or bladder control.  The history is provided by the patient and medical records. No language interpreter was used.       Past Medical History:  Diagnosis Date  . Allergy   . Anemia   . Anxiety   . Blood transfusion without reported diagnosis    Dec 2018  . Cataract    right eye  . Depression   . Diabetes type 1, uncontrolled ([place]) 11/14/2011   Since age 64  . Fibromyalgia   . Gastroparesis   . GERD (gastroesophageal reflux disease)   . Hypertension   . Infection    UTI April 2016    Patient Active Problem List   Diagnosis Date Noted  . Nausea & vomiting 04/30/2019  . Contraception management 08/06/2018  . Cyclical vomiting syndrome 06/07/2018  . Diabetic gastroparesis associated with type 1 diabetes mellitus ([place]) 06/04/2018  . Anxiety 05/31/2018  . Peripheral edema 05/31/2018  . Normocytic anemia 07/10/2017  . GERD (gastroesophageal reflux disease) 06/23/2017  . MDD (major depressive disorder), recurrent episode, mild ([place]) 02/28/2017  . Diabetes mellitus type 1 ([place]) 04/28/2013  .  Protein-calorie malnutrition, severe ([place]) 04/28/2013  . Hypertension associated with diabetes ([place]) 11/14/2011  . Chronic abdominal pain 09/18/2011    Past Surgical History:  Procedure Laterality Date  . ANKLE SURGERY    . CHOLECYSTECTOMY  11/15/2011   Procedure: LAPAROSCOPIC CHOLECYSTECTOMY WITH INTRAOPERATIVE CHOLANGIOGRAM;  Surgeon: [name], MD;  Location: [facility];  Service: General;  Laterality: N/A;  . COLONOSCOPY    . COLONOSCOPY WITH PROPOFOL N/A 06/27/2017   Procedure: COLONOSCOPY WITH PROPOFOL;  Surgeon: Milus Banister, MD;  Location: [facility];  Service: Endoscopy;  Laterality: N/A;  . ESOPHAGOGASTRODUODENOSCOPY  12/03/2011   Procedure: ESOPHAGOGASTRODUODENOSCOPY (EGD);  Surgeon: Beryle Beams, MD;  Location: [facility];  Service: Endoscopy;  Laterality: N/A;  . FLEXIBLE SIGMOIDOSCOPY N/A 03/10/2017   Procedure: FLEXIBLE SIGMOIDOSCOPY;  Surgeon: Carol Ada, MD;  Location: [facility];  Service: Endoscopy;  Laterality: N/A;  . INCISION AND DRAINAGE PERIRECTAL ABSCESS N/A 03/01/2017   Procedure: IRRIGATION AND DEBRIDEMENT PERIRECTAL ABSCESS;  Surgeon: Alphonsa Overall, MD;  Location: [facility];  Service: General;  Laterality: N/A;  . IRRIGATION AND DEBRIDEMENT BUTTOCKS N/A 03/23/2017   Procedure: IRRIGATION AND DEBRIDEMENT BUTTOCKS, SETON PLACEMENT;  Surgeon: [name], MD;  Location: [facility];  Service: General;  Laterality: N/A;  . LAPAROSCOPY  11/23/2011   Procedure: LAPAROSCOPY DIAGNOSTIC;  Surgeon: Edward Jolly, MD;  Location: [facility];  Service: General;  Laterality: N/A;  . SIGMOIDOSCOPY    . UPPER GASTROINTESTINAL ENDOSCOPY    . WISDOM TOOTH EXTRACTION       OB History  Gravida  1   Para  1   Term      Preterm  1   AB      Living  1     SAB      TAB      Ectopic      Multiple  0   Live Births  1           Family History  Problem Relation Age of Onset  . Diabetes Mother   . Hypertension Father   . Colon cancer Paternal  Grandmother        pt thinks PGM was dx in her 79's  . Diabetes Paternal Grandmother   . Diabetes Maternal Grandmother   . Diabetes Maternal Grandfather   . Diabetes Paternal Grandfather   . Diabetes Other   . Esophageal cancer Neg Hx   . Liver cancer Neg Hx   . Pancreatic cancer Neg Hx   . Stomach cancer Neg Hx   . Rectal cancer Neg Hx     Social History   Tobacco Use  . Smoking status: Never Smoker  . Smokeless tobacco: Never Used  Substance Use Topics  . Alcohol use: No    Alcohol/week: 0.0 standard drinks  . Drug use: Yes    Types: Marijuana    Home Medications Prior to Admission medications   Medication Sig Start Date End Date Taking? Authorizing Provider  Continuous Blood Gluc Receiver ([place]) [place] See admin instructions. USE AS DIRECTED TO MONITOR GLUCOSE LEVELS 10/29/18  Yes [provider]  Continuous Blood Gluc Sensor (DEXCOM G6 SENSOR) MISC 1 each by Does not apply route See admin instructions. every 10 days. 12/31/18  Yes Renato Shin, MD  Continuous Blood Gluc Transmit (DEXCOM G6 TRANSMITTER) MISC See admin instructions. USE AS DIRECTED TO MONITOR GLUCOSE LEVELS 10/29/18  Yes [provider]  dicyclomine (BENTYL) 20 MG tablet Take 1 tablet (20 mg total) by mouth 3 (three) times daily as needed for spasms. 03/16/19  Yes [name], MD  furosemide (LASIX) 40 MG tablet TAKE 1 TABLET BY MOUTH  DAILY Patient taking differently: Take 40 mg by mouth daily.  11/11/18  Yes Armbruster, Carlota Raspberry, MD  hydrALAZINE (APRESOLINE) 50 MG tablet Take 1 tablet (50 mg total) by mouth every 6 (six) hours. 10/23/18  Yes Swayze, Ava, DO  Insulin Glargine (LANTUS SOLOSTAR) 100 UNIT/ML Solostar Pen Inject 10 Units into the skin at bedtime. And pen needles 4/day Patient taking differently: Inject 12 Units into the skin at bedtime. And pen needles 4/day 12/31/18  Yes Renato Shin, MD  insulin lispro (HUMALOG KWIKPEN) 100 UNIT/ML KwikPen Inject 0.06 mLs (6 Units  total) into the skin 3 (three) times daily. Patient taking differently: Inject 5 Units into the skin 3 (three) times daily.  12/31/18  Yes Renato Shin, MD  LORazepam (ATIVAN) 0.5 MG tablet Take 1 tablet (0.5 mg total) by mouth 2 (two) times daily as needed for anxiety. 04/05/19  Yes [name], MD  metoprolol tartrate (LOPRESSOR) 25 MG tablet Take 1 tablet (25 mg total) by mouth 2 (two) times daily. 10/23/18  Yes Swayze, Ava, DO  mirtazapine (REMERON) 30 MG tablet TAKE 1 TABLET BY MOUTH AT  BEDTIME Patient taking differently: Take 30 mg by mouth at bedtime.  04/19/19  Yes [name], MD  pantoprazole (PROTONIX) 40 MG tablet TAKE 1 TABLET BY MOUTH  DAILY Patient taking differently: Take 40 mg by mouth daily.  10/22/18  Yes Armbruster,  Carlota Raspberry, MD  cyclobenzaprine (FLEXERIL) 10 MG tablet TAKE 1 TABLET(10 MG) BY MOUTH THREE TIMES DAILY AS NEEDED FOR MUSCLE SPASMS 04/29/19   [name], MD  promethazine (PHENERGAN) 25 MG tablet Take 1 tablet (25 mg total) by mouth every 6 (six) hours as needed for nausea or vomiting. 03/14/19   Drenda Freeze, MD  PROMETHEGAN 12.5 MG suppository Place 12.5 mg rectally every 8 (eight) hours as needed for nausea or vomiting.  03/14/19   [provider]  traMADol (ULTRAM) 50 MG tablet TAKE 1 TABLET(50 MG) BY MOUTH EVERY 6 HOURS AS NEEDED FOR MODERATE PAIN OR SEVERE PAIN 04/28/19   [name], MD  metoCLOPramide (REGLAN) 10 MG tablet Take 1 tablet (10 mg total) by mouth every 6 (six) hours as needed for nausea or vomiting. Patient not taking: Reported on 04/23/2019 02/20/18 04/23/19  Shelly Coss, MD  potassium chloride 20 MEQ TBCR Take 20 mEq by mouth 2 (two) times daily. Patient not taking: Reported on 03/31/2019 10/17/18 04/23/19  Isla Pence, MD  potassium chloride SA (KLOR-CON) 20 MEQ tablet Take 1 tablet (20 mEq total) by mouth daily. Patient not taking: Reported on 03/31/2019 03/14/19 04/23/19  Drenda Freeze, MD    Allergies      Other and Lactose intolerance (gi)  Review of Systems   Review of Systems  Constitutional: Negative for appetite change, diaphoresis, fatigue, fever and unexpected weight change.  HENT: Negative for mouth sores.   Eyes: Negative for visual disturbance.  Respiratory: Negative for cough, chest tightness, shortness of breath and wheezing.   Cardiovascular: Negative for chest pain.  Gastrointestinal: Positive for abdominal pain, nausea and vomiting. Negative for constipation and diarrhea.  Endocrine: Negative for polydipsia, polyphagia and polyuria.  Genitourinary: Negative for dysuria, frequency, hematuria and urgency.  Musculoskeletal: Positive for back pain. Negative for neck stiffness.  Skin: Negative for rash.  Allergic/Immunologic: Negative for immunocompromised state.  Neurological: Negative for syncope, light-headedness and headaches.  Hematological: Does not bruise/bleed easily.  Psychiatric/Behavioral: Negative for sleep disturbance. The patient is not nervous/anxious.     Physical Exam Updated Vital Signs BP (!) 193/125 (BP Location: Left Arm)   Pulse (!) 119   Temp 98 F (36.7 C) (Oral)   Resp (!) 24   Ht 5' 7"  (1.702 m)   Wt 63.5 kg   LMP 04/30/2019   SpO2 100%   BMI 21.93 kg/m   Physical Exam Vitals and nursing note reviewed.  Constitutional:      Appearance: She is not diaphoretic.     Comments: Patient sobbing and vomiting.  HENT:     Head: Normocephalic.  Eyes:     General: No scleral icterus.    Conjunctiva/sclera: Conjunctivae normal.  Cardiovascular:     Rate and Rhythm: Regular rhythm. Tachycardia present.     Pulses: Normal pulses.          Radial pulses are 2+ on the right side and 2+ on the left side.  Pulmonary:     Effort: No tachypnea, accessory muscle usage, prolonged expiration, respiratory distress or retractions.     Breath sounds: No stridor.     Comments: Equal chest rise. No increased work of breathing. Abdominal:     General:  Abdomen is flat. There is no distension.     Palpations: Abdomen is soft.     Tenderness: There is generalized abdominal tenderness. There is no right CVA tenderness, left CVA tenderness, guarding or rebound.  Musculoskeletal:  Cervical back: Normal range of motion. Normal range of motion.     Thoracic back: Normal range of motion.     Lumbar back: Normal range of motion.     Comments: Moves all extremities equally and without difficulty.  Skin:    General: Skin is warm and dry.     Capillary Refill: Capillary refill takes less than 2 seconds.  Neurological:     Mental Status: She is alert.     GCS: GCS eye subscore is 4. GCS verbal subscore is 5. GCS motor subscore is 6.     Comments: Speech is clear and goal oriented. Strength 5/5 in the bilateral upper and lower extremities.  Sensation grossly intact.  Ambulatory with steady gait.  Psychiatric:        Mood and Affect: Mood normal.     ED Results / Procedures / Treatments   Labs (all labs ordered are listed, but only abnormal results are displayed) Labs Reviewed  COMPREHENSIVE METABOLIC PANEL - Abnormal; Notable for the following components:      Result Value   Potassium 2.6 (*)    Chloride 94 (*)    CO2 33 (*)    Glucose, Bld 244 (*)    Calcium 8.5 (*)    Albumin 3.4 (*)    All other components within normal limits  CBC - Abnormal; Notable for the following components:   RBC 3.58 (*)    Hemoglobin 9.7 (*)    HCT 30.3 (*)    All other components within normal limits  SARS CORONAVIRUS 2 (TAT 6-24 HRS)  LIPASE, BLOOD  URINALYSIS, ROUTINE W REFLEX MICROSCOPIC  I-STAT BETA HCG BLOOD, ED ([facility], [facility], [facility] ONLY)    EKG EKG Interpretation  Date/Time:  Saturday April 30 2019 06:03:28 EST Ventricular Rate:  116 PR Interval:    QRS Duration: 81 QT Interval:  334 QTC Calculation: 464 R Axis:   83 Text Interpretation: Sinus tachycardia Atrial premature complex RSR' in V1 or V2, probably normal variant Nonspecific T  abnormalities, lateral leads Baseline wander in lead(s) I III aVL aVF No significant change since last tracing Confirmed by Ripley Fraise [phone]) on 04/30/2019 6:07:15 AM   Procedures Procedures (including critical care time)  Medications Ordered in ED Medications  potassium chloride 10 mEq in 100 mL IVPB (10 mEq Intravenous New Bag/Given 04/30/19 0606)  LORazepam (ATIVAN) injection 1 mg (has no administration in time range)  promethazine (PHENERGAN) injection 25 mg (has no administration in time range)  metoprolol tartrate (LOPRESSOR) injection 5 mg (has no administration in time range)  sodium chloride flush (NS) 0.9 % injection 3 mL (3 mLs Intravenous Given 04/30/19 0605)  sodium chloride 0.9 % bolus 500 mL (500 mLs Intravenous New Bag/Given 04/30/19 0608)    ED Course  I have reviewed the triage vital signs and the nursing notes.  Pertinent labs & imaging results that were available during my care of the patient were reviewed by me and considered in my medical decision making (see chart for details).  Clinical Course as of Apr 29 625  Sat Apr 30, 2019  8003 Discussed with Dr. Hal Hope who will admit   [HM]  [phone] Patient hypertensive here in the emergency department.  She has a history of same and reports has been unable to keep her medications down all week.  Lopressor given.  BP(!): 193/125 [HM]    Clinical Course User Index [HM] Karri Kallenbach, Gwenlyn Perking   MDM Rules/Calculators/A&P  Patient with history of gastroparesis presents with intractable vomiting.  Actively vomiting in the room.  Labs with significant hypokalemia at 2.6.  She is tachycardic.  Afebrile.  Glucose elevated at 244 but no evidence of DKA.  Will attempt to control pain and emesis.    Patient hypertensive here in the emergency department.  She has not had any of her medications for a week.  Patient denies chest pain or shortness of breath.  Doubt hypertensive urgency.  EKG  nonischemic.  Will give Lopressor.  Discussed with Dr. Hal Hope for admission.   Final Clinical Impression(s) / ED Diagnoses Final diagnoses:  Intractable vomiting with nausea, unspecified vomiting type  Diabetic gastroparesis ([place])  Hypokalemia  Hypertension, unspecified type    Rx / DC Orders ED Discharge Orders    None       Loni Muse Gwenlyn Perking 04/30/19 1587    Ripley Fraise, MD 04/30/19 [phone]

## 2019-04-30 NOTE — Progress Notes (Signed)
Patient states she is "very sensitive to insulin" and has refused to take scheduled Novolog with each meal. Is using the sliding scale coverage. Tolerating clear liquids thus far. No further diarrhea. Does continue to have severe back pain rating it 8-9/10. Requiring MS fairly regularly. Eulas Post, RN

## 2019-04-30 NOTE — Progress Notes (Signed)
CRITICAL VALUE ALERT  Critical Value: Potassium 2.5  Date & Time Notied:  04/30/19 @ 1300  Provider Notified: yes  Orders Received/Actions taken: Awaiting orders

## 2019-04-30 NOTE — H&P (Signed)
History and Physical    [name] PQA:[id number] DOB: July 23, 1990 DOA: 04/30/2019  PCP: [name], MD   Patient coming from: Home    Chief Complaint: Nausea, vomiting  HPI:  Birda Didonato [name] an 29 y.o.femalewith medical history significant ofinsulin-dependent diabetes mellitus with gastroparesis, hypertension, anxiety, depression, fibromyalgia, GERD; who presents with complaints of 1 week of epigastric abdominal pain with nausea and vomiting.  Symptoms started 5 days ago.  She was admitted with similar complaints on September, 2020.  She was taking Phenergan  for her nausea and vomiting but it did not help.  She was taking insulin as directed.She follows with endocrinologist Dr. [name].  Patient also complains of severe back pain.  She says, she has chronic back pain due to chronic nausea and vomiting. Patient seen and examined the bedside in the emergency department.  She was hypertensive.She was actively vomiting and was complaining of severe back pain. She denies any fever, chills, chest pain, shortness of breath, diarrhea.  She lives with her husband and daughter.  She has not missed her medications and monitors her blood sugars regularly.  ED Course: Tachycardic and hypertensive on presentation.  Elevated glucose at 244.  Found to have potassium of 2.6.  No evidence of DKA.  Decision was made to admit the patient to control pain, nausea.  She was unable to tolerate any food or drink by mouth.  Review of Systems: As per HPI otherwise 10 point review of systems negative.    Past Medical History:  Diagnosis Date  . Allergy   . Anemia   . Anxiety   . Blood transfusion without reported diagnosis    Dec 2018  . Cataract    right eye  . Depression   . Diabetes type 1, uncontrolled ([place]) 11/14/2011   Since age 8  . Fibromyalgia   . Gastroparesis   . GERD (gastroesophageal reflux disease)   . Hypertension   . Infection    UTI April 2016    Past Surgical History:    Procedure Laterality Date  . ANKLE SURGERY    . CHOLECYSTECTOMY  11/15/2011   Procedure: LAPAROSCOPIC CHOLECYSTECTOMY WITH INTRAOPERATIVE CHOLANGIOGRAM;  Surgeon: [name], MD;  Location: [facility];  Service: General;  Laterality: N/A;  . COLONOSCOPY    . COLONOSCOPY WITH PROPOFOL N/A 06/27/2017   Procedure: COLONOSCOPY WITH PROPOFOL;  Surgeon: Milus Banister, MD;  Location: [facility];  Service: Endoscopy;  Laterality: N/A;  . ESOPHAGOGASTRODUODENOSCOPY  12/03/2011   Procedure: ESOPHAGOGASTRODUODENOSCOPY (EGD);  Surgeon: Beryle Beams, MD;  Location: [facility];  Service: Endoscopy;  Laterality: N/A;  . FLEXIBLE SIGMOIDOSCOPY N/A 03/10/2017   Procedure: FLEXIBLE SIGMOIDOSCOPY;  Surgeon: Carol Ada, MD;  Location: [facility];  Service: Endoscopy;  Laterality: N/A;  . INCISION AND DRAINAGE PERIRECTAL ABSCESS N/A 03/01/2017   Procedure: IRRIGATION AND DEBRIDEMENT PERIRECTAL ABSCESS;  Surgeon: Alphonsa Overall, MD;  Location: [facility];  Service: General;  Laterality: N/A;  . IRRIGATION AND DEBRIDEMENT BUTTOCKS N/A 03/23/2017   Procedure: IRRIGATION AND DEBRIDEMENT BUTTOCKS, SETON PLACEMENT;  Surgeon: [name], MD;  Location: [facility];  Service: General;  Laterality: N/A;  . LAPAROSCOPY  11/23/2011   Procedure: LAPAROSCOPY DIAGNOSTIC;  Surgeon: Edward Jolly, MD;  Location: [facility];  Service: General;  Laterality: N/A;  . SIGMOIDOSCOPY    . UPPER GASTROINTESTINAL ENDOSCOPY    . WISDOM TOOTH EXTRACTION       reports that she has never smoked. She has never used smokeless tobacco. She  reports current drug use. Drug: Marijuana. She reports that she does not drink alcohol.  Allergies  Allergen Reactions  . Other Anaphylaxis    Reaction to [place] nuts   . Lactose Intolerance (Gi) Diarrhea    Family History  Problem Relation Age of Onset  . Diabetes Mother   . Hypertension Father   . Colon cancer Paternal Grandmother        pt thinks PGM was dx in her 4's  . Diabetes  Paternal Grandmother   . Diabetes Maternal Grandmother   . Diabetes Maternal Grandfather   . Diabetes Paternal Grandfather   . Diabetes Other   . Esophageal cancer Neg Hx   . Liver cancer Neg Hx   . Pancreatic cancer Neg Hx   . Stomach cancer Neg Hx   . Rectal cancer Neg Hx      Prior to Admission medications   Medication Sig Start Date End Date Taking? Authorizing Provider  Continuous Blood Gluc Receiver ([place]) [place] See admin instructions. USE AS DIRECTED TO MONITOR GLUCOSE LEVELS 10/29/18  Yes [provider]  Continuous Blood Gluc Sensor (DEXCOM G6 SENSOR) MISC 1 each by Does not apply route See admin instructions. every 10 days. 12/31/18  Yes Renato Shin, MD  Continuous Blood Gluc Transmit (DEXCOM G6 TRANSMITTER) MISC See admin instructions. USE AS DIRECTED TO MONITOR GLUCOSE LEVELS 10/29/18  Yes [provider]  dicyclomine (BENTYL) 20 MG tablet Take 1 tablet (20 mg total) by mouth 3 (three) times daily as needed for spasms. 03/16/19  Yes [name], MD  furosemide (LASIX) 40 MG tablet TAKE 1 TABLET BY MOUTH  DAILY Patient taking differently: Take 40 mg by mouth daily.  11/11/18  Yes Armbruster, Carlota Raspberry, MD  hydrALAZINE (APRESOLINE) 50 MG tablet Take 1 tablet (50 mg total) by mouth every 6 (six) hours. 10/23/18  Yes Swayze, Ava, DO  Insulin Glargine (LANTUS SOLOSTAR) 100 UNIT/ML Solostar Pen Inject 10 Units into the skin at bedtime. And pen needles 4/day Patient taking differently: Inject 12 Units into the skin at bedtime. And pen needles 4/day 12/31/18  Yes Renato Shin, MD  insulin lispro (HUMALOG KWIKPEN) 100 UNIT/ML KwikPen Inject 0.06 mLs (6 Units total) into the skin 3 (three) times daily. Patient taking differently: Inject 5 Units into the skin 3 (three) times daily.  12/31/18  Yes Renato Shin, MD  LORazepam (ATIVAN) 0.5 MG tablet Take 1 tablet (0.5 mg total) by mouth 2 (two) times daily as needed for anxiety. 04/05/19  Yes [name], MD    metoprolol tartrate (LOPRESSOR) 25 MG tablet Take 1 tablet (25 mg total) by mouth 2 (two) times daily. 10/23/18  Yes Swayze, Ava, DO  mirtazapine (REMERON) 30 MG tablet TAKE 1 TABLET BY MOUTH AT  BEDTIME Patient taking differently: Take 30 mg by mouth at bedtime.  04/19/19  Yes [name], MD  pantoprazole (PROTONIX) 40 MG tablet TAKE 1 TABLET BY MOUTH  DAILY Patient taking differently: Take 40 mg by mouth daily.  10/22/18  Yes Armbruster, Carlota Raspberry, MD  cyclobenzaprine (FLEXERIL) 10 MG tablet TAKE 1 TABLET(10 MG) BY MOUTH THREE TIMES DAILY AS NEEDED FOR MUSCLE SPASMS 04/29/19   [name], MD  promethazine (PHENERGAN) 25 MG tablet Take 1 tablet (25 mg total) by mouth every 6 (six) hours as needed for nausea or vomiting. 03/14/19   Drenda Freeze, MD  PROMETHEGAN 12.5 MG suppository Place 12.5 mg rectally every 8 (eight) hours as needed for nausea  or vomiting.  03/14/19   [provider]  traMADol (ULTRAM) 50 MG tablet TAKE 1 TABLET(50 MG) BY MOUTH EVERY 6 HOURS AS NEEDED FOR MODERATE PAIN OR SEVERE PAIN 04/28/19   [name], MD  metoCLOPramide (REGLAN) 10 MG tablet Take 1 tablet (10 mg total) by mouth every 6 (six) hours as needed for nausea or vomiting. Patient not taking: Reported on 04/23/2019 02/20/18 04/23/19  Shelly Coss, MD  potassium chloride 20 MEQ TBCR Take 20 mEq by mouth 2 (two) times daily. Patient not taking: Reported on 03/31/2019 10/17/18 04/23/19  Isla Pence, MD  potassium chloride SA (KLOR-CON) 20 MEQ tablet Take 1 tablet (20 mEq total) by mouth daily. Patient not taking: Reported on 03/31/2019 03/14/19 04/23/19  Drenda Freeze, MD    Physical Exam: Vitals:   04/30/19 0235 04/30/19 0534 04/30/19 0538  BP: (!) 170/108 (!) 187/126 (!) 193/125  Pulse: (!) 108 (!) 118 (!) 119  Resp: (!) 24 18 (!) 24  Temp: 98.1 F (36.7 C) 98 F (36.7 C) 98 F (36.7 C)  TempSrc: Oral Oral Oral  SpO2: 100%  100%  Weight: 63.5 kg    Height: 5' 7"  (1.702 m)       Constitutional: In moderate to severe distress due to nausea, vomiting and back pain Vitals:   04/30/19 0235 04/30/19 0534 04/30/19 0538  BP: (!) 170/108 (!) 187/126 (!) 193/125  Pulse: (!) 108 (!) 118 (!) 119  Resp: (!) 24 18 (!) 24  Temp: 98.1 F (36.7 C) 98 F (36.7 C) 98 F (36.7 C)  TempSrc: Oral Oral Oral  SpO2: 100%  100%  Weight: 63.5 kg    Height: 5' 7"  (1.702 m)     Eyes: PERRL, lids and conjunctivae normal. Neck: normal, supple, no masses, no thyromegaly Respiratory: clear to auscultation bilaterally, no wheezing, no crackles. Normal respiratory effort. No accessory muscle use.  Cardiovascular: Regular rate and rhythm, no murmurs / rubs / gallops. No extremity edema.  Abdomen: no tenderness, no masses palpated. No hepatosplenomegaly. Bowel sounds positive.  Musculoskeletal: no clubbing / cyanosis. No joint deformity upper and lower extremities.  Skin: no rashes, lesions, ulcers. No induration Neurologic: CN 2-12 grossly intact. Strength 5/5 in all 4.  Psychiatric: Normal judgment and insight. Alert and oriented x 3.  Foley Catheter:None  Labs on Admission: I have personally reviewed following labs and imaging studies  CBC: Recent Labs  Lab 04/23/19 0806 04/24/19 1310 04/30/19 0257  WBC 5.9 6.1 5.5  NEUTROABS 4.5 4.7  --   HGB 10.7* 10.7* 9.7*  HCT 32.4* 32.0* 30.3*  MCV 82.4 82.7 84.6  PLT 222 245 902   Basic Metabolic Panel: Recent Labs  Lab 04/23/19 0806 04/24/19 1310 04/30/19 0257  NA 136 134* 139  K 3.2* 3.3* 2.6*  CL 92* 91* 94*  CO2 27 28 33*  GLUCOSE 308* 392* 244*  BUN 14 17 11   CREATININE 0.90 0.96 0.86  CALCIUM 9.4 9.0 8.5*   GFR: Estimated Creatinine Clearance: 93.9 mL/min (by C-G formula based on SCr of 0.86 mg/dL). Liver Function Tests: Recent Labs  Lab 04/23/19 0806 04/24/19 1310 04/30/19 0257  AST 24 26 21   ALT 40 31 20  ALKPHOS 105 95 78  BILITOT 0.8 1.1 0.8  PROT 8.2* 8.4* 7.1  ALBUMIN 3.7 3.9 3.4*   Recent Labs   Lab 04/24/19 1310 04/30/19 0257  LIPASE 19 17   No results for input(s): AMMONIA in the last 168 hours. Coagulation Profile: No results  for input(s): INR, PROTIME in the last 168 hours. Cardiac Enzymes: No results for input(s): CKTOTAL, CKMB, CKMBINDEX, TROPONINI in the last 168 hours. BNP (last 3 results) No results for input(s): PROBNP in the last 8760 hours. HbA1C: No results for input(s): HGBA1C in the last 72 hours. CBG: Recent Labs  Lab 04/23/19 1023 04/23/19 1134 04/24/19 1614  GLUCAP 337* 293* 250*   Lipid Profile: No results for input(s): CHOL, HDL, LDLCALC, TRIG, CHOLHDL, LDLDIRECT in the last 72 hours. Thyroid Function Tests: No results for input(s): TSH, T4TOTAL, FREET4, T3FREE, THYROIDAB in the last 72 hours. Anemia Panel: No results for input(s): VITAMINB12, FOLATE, FERRITIN, TIBC, IRON, RETICCTPCT in the last 72 hours. Urine analysis:    Component Value Date/Time   COLORURINE YELLOW 04/30/2019 0258   APPEARANCEUR CLEAR 04/30/2019 0258   LABSPEC 1.017 04/30/2019 0258   PHURINE 7.0 04/30/2019 0258   GLUCOSEU >=500 (A) 04/30/2019 0258   HGBUR SMALL (A) 04/30/2019 0258   BILIRUBINUR NEGATIVE 04/30/2019 0258   BILIRUBINUR negative 01/02/2015 1717   KETONESUR 5 (A) 04/30/2019 0258   PROTEINUR >=300 (A) 04/30/2019 0258   UROBILINOGEN 0.2 01/13/2015 0223   NITRITE NEGATIVE 04/30/2019 0258   LEUKOCYTESUR NEGATIVE 04/30/2019 0258    Radiological Exams on Admission: No results found.   Assessment/Plan Active Problems:   Nausea & vomiting   Intractable nausea and vomitingsuspectsecondarytodiabeticgastroparesis: Acute on chronic.Patient presents with 5 days ofnausea, vomiting, and epigastric abdominal pain.Secondary to  Gastroparesis, similar presentation previously in the past. Started on Reglan.  Takes Phenergan at home. Advised to take small and frequent meals at home.  Needs glycemic control. We will keep her n.p.o. for now.  Gradually advance  the diet as tolerated.  Severe hypokalemia/hypomagnesemia: Secondary to vomiting.  Supplemented.  Continue to monitor the levels  DM type I:Not in DKA.  Started on Lantus and sliding scale insulin.  She follows with her endocrinologist.  Last hemoglobin A1c of 9.4 done in October 2020.  We will check new hemoglobin A1c.  Uncontrolled  hypertension:  Continue PRN meds.  Continue home medications of hydralazine and metoprolol.  She says she has been taking her meds at home.  Normocytic normochromic anemia: Chronic.  Baseline hemoglobin appears to range from 10-11.  Lumbar back pain, history of fibromyalgia: Patient denies any reports of any recent trauma or fall. Normally on tramadol pain at home.  She says her back pain is secondary to chronic vomiting.  GERD: Continue PPI  Anxiety/depression: Continue home meds   Severity of Illness: The appropriate patient status for this patient is OBSERVATION.     DVT prophylaxis: Lovenox Code Status: Full Family Communication: Non present at the bedside Consults called: None     Shelly Coss MD [facility] Pager [phone]  If 7PM-7AM, please contact night-coverage [email or web address] Password [facility]  04/30/2019, 7:57 AM

## 2019-04-30 NOTE — ED Triage Notes (Signed)
Pt reports upper back pain from her throwing up x 3 to 4 days

## 2019-05-01 ENCOUNTER — Other Ambulatory Visit: Payer: Self-pay | Admitting: Gastroenterology

## 2019-05-01 ENCOUNTER — Other Ambulatory Visit: Payer: Self-pay | Admitting: Family Medicine

## 2019-05-01 DIAGNOSIS — R112 Nausea with vomiting, unspecified: Secondary | ICD-10-CM

## 2019-05-01 LAB — CBC
HCT: 27.8 % — ABNORMAL LOW (ref 36.0–46.0)
Hemoglobin: 9 g/dL — ABNORMAL LOW (ref 12.0–15.0)
MCH: 28.3 pg (ref 26.0–34.0)
MCHC: 32.4 g/dL (ref 30.0–36.0)
MCV: 87.4 fL (ref 80.0–100.0)
Platelets: 226 10*3/uL (ref 150–400)
RBC: 3.18 MIL/uL — ABNORMAL LOW (ref 3.87–5.11)
RDW: 12.8 % (ref 11.5–15.5)
WBC: 5.9 10*3/uL (ref 4.0–10.5)
nRBC: 0 % (ref 0.0–0.2)

## 2019-05-01 LAB — BASIC METABOLIC PANEL
Anion gap: 7 (ref 5–15)
BUN: 9 mg/dL (ref 6–20)
CO2: 26 mmol/L (ref 22–32)
Calcium: 7.9 mg/dL — ABNORMAL LOW (ref 8.9–10.3)
Chloride: 105 mmol/L (ref 98–111)
Creatinine, Ser: 0.65 mg/dL (ref 0.44–1.00)
GFR calc Af Amer: >60 mL/min (ref >60)
GFR calc non Af Amer: >60 mL/min (ref >60)
Glucose, Bld: 104 mg/dL — ABNORMAL HIGH (ref 70–99)
Potassium: 3.4 mmol/L — ABNORMAL LOW (ref 3.5–5.1)
Sodium: 138 mmol/L (ref 135–145)

## 2019-05-01 LAB — MAGNESIUM: Magnesium: 1.8 mg/dL (ref 1.7–2.4)

## 2019-05-01 LAB — GLUCOSE, CAPILLARY
Glucose-Capillary: 45 mg/dL — ABNORMAL LOW (ref 70–99)
Glucose-Capillary: 62 mg/dL — ABNORMAL LOW (ref 70–99)
Glucose-Capillary: 93 mg/dL (ref 70–99)
Glucose-Capillary: 144 mg/dL — ABNORMAL HIGH (ref 70–99)
Glucose-Capillary: 151 mg/dL — ABNORMAL HIGH (ref 70–99)

## 2019-05-01 MED ORDER — GLUCOSE 4 G PO CHEW
CHEWABLE_TABLET | ORAL | Status: AC
  Administered 2019-05-01: 4 g
  Filled 2019-05-01: qty 1

## 2019-05-01 MED ORDER — DICYCLOMINE HCL 20 MG PO TABS: 20.0000 mg | ORAL_TABLET | Freq: Three times a day (TID) | ORAL | 1 refills | Status: DC | PRN

## 2019-05-01 MED ORDER — INSULIN ASPART 100 UNIT/ML ~~LOC~~ SOLN: 2.0000 [IU] | Freq: Three times a day (TID) | SUBCUTANEOUS | Status: DC

## 2019-05-01 MED ORDER — GLUCOSE 4 G PO CHEW: 1.0000 | CHEWABLE_TABLET | Freq: Once | ORAL | Status: DC

## 2019-05-01 NOTE — Progress Notes (Signed)
Hypoglycemic Event  CBG: 45  Treatment: 8 oz juice/soda  Symptoms: Shaky and weakness  Follow-up CBG: Time: 0239 CBG Result: 62  Possible Reasons for Event: Inadequate meal intake  Comments/MD notified:standing orders initiated     [name]

## 2019-05-01 NOTE — Progress Notes (Signed)
Hypoglycemic Event  CBG: 62  Treatment: Glucose chewable tablet 4g  Symptoms: Shaky and weakness  Follow-up CBG: Time: 0311 CBG Result: 93  Possible Reasons for Event: Inadequate meal intake  Comments/MD notified: Standing orders initiated   [name]

## 2019-05-01 NOTE — Progress Notes (Signed)
Physician Discharge Summary  [name] TUU:[id number] DOB: 09/21/1990 DOA: 04/30/2019  PCP: [name], MD  Admit date: 04/30/2019 Discharge date: 05/01/2019  Time spent: 25 minutes  Recommendations for Outpatient Follow-up:  1. Patient recommended to get close follow-up in the outpatient setting refill Bentyl this admission 2. Ensure overall compliance with plan of care resuming usual home meds medication on discharge 3. Suggest outpatient nephropathy ophthalmopathy skin screening and vaccinations  Discharge Diagnoses:  Principal Problem:   Nausea & vomiting Active Problems:   Chronic abdominal pain   Diabetes mellitus type 1 (HCC)   Diabetic gastroparesis associated with type 1 diabetes mellitus (HCC)   Hypertensive urgency   Intractable nausea and vomiting   Discharge Condition: Improved  Diet recommendation: Diabetic  Filed Weights   04/30/19 0235  Weight: 63.5 kg    History of present illness:  29 year old black female DM ty 2, prior DKA, anxiety depression, severe gastroparesis with over 13 emergency room visits in the past month Read presented after being seen in the emergency room 1/24 again on 1/30-have been using her Phenergan reports that her triggering and inciting factors are the onset of her menstrual cycle found to be significantly hypokalemic tachycardic   Data Reviewed:  Potassium 3.1 BUN/creatinine 9/0.6 calcium seven Hemoglobin 9.0 WBC 5.9 platelet 226   Diabetic gastroparesis Initially kept on clear liquids advance diet was able to keep it down felt to have met multiple parameters for discharge has all medications including Reglan Phenergan etc. Did refill at her request outpatient dicyclomine 20 mg 3 times daily and gave 1 refill knows to follow-up with regular physicians DM TY two with complication of hypoglycemia alternating with hyperglycemia-A1c 7.8 Usually on Lantus 40, lispro 6 units 3 times daily Blood sugars were low 60-144 on  admission however she can self manage Hypokalemia Received IV pushes of potassium and seems to be normalizing Needs Chem-7 outpatient Anxiety depression Continue Remeron 30 at bedtime, lorazepam 0.5 twice daily Reported fibromyalgia Continue Flexeril 10 mg 3 times daily muscle spasm LVH on echo 02/1817 Does not seem acute-Lasix was held on admission but resumed on discharge Continue metoprolol 25 twice daily    Discharge Exam: Vitals:   05/01/19 0212 05/01/19 0519  BP: (!) 131/99 (!) 151/104  Pulse: 83 90  Resp:  18  Temp:  98.5 F (36.9 C)  SpO2:  99%    General: Awake coherent no distress EOMI NCAT tolerating diet sitting up in bed playing on her iPad Cardiovascular: S1-S2 no murmur rub or gallop sinus rhythm on monitors Respiratory: Clinically clear no added sound Abdomen soft no rebound no guarding Neurologically is intact  Discharge Instructions   Discharge Instructions    Diet - low sodium heart healthy   Complete by: As directed    Discharge instructions   Complete by: As directed    We have refilled your medication B[place] to prevent you from getting recurrences of cyclical vomiting-it is advisable to continue the good work that you are doing with regards to blood glucose control as this will prevent you from getting further episodes of gastroparesis  Your A1c was pretty well controlled compared to prior at 7.8 which indicates that you are making good efforts to try and comply I would recommend that you get I kidney and skin screening because of your longstanding diabetes in the outpatient setting and make sure that you get your usual preventative shots Good luck and continue to do well and try to stay out of  the emergency room-we will get a case manager involved in your care to ensure that you have resources and follow-up   Increase activity slowly   Complete by: As directed      Allergies as of 05/01/2019      Reactions   Other Anaphylaxis   Reaction to [place]  nuts   Lactose Intolerance (gi) Diarrhea      Medication List    TAKE these medications   cyclobenzaprine 10 MG tablet Commonly known as: FLEXERIL TAKE 1 TABLET(10 MG) BY MOUTH THREE TIMES DAILY AS NEEDED FOR MUSCLE SPASMS   Dexcom G6 Receiver [place] See admin instructions. USE AS DIRECTED TO MONITOR GLUCOSE LEVELS   Dexcom G6 Sensor Misc 1 each by Does not apply route See admin instructions. every 10 days.   Dexcom G6 Transmitter Misc See admin instructions. USE AS DIRECTED TO MONITOR GLUCOSE LEVELS   dicyclomine 20 MG tablet Commonly known as: BENTYL Take 1 tablet (20 mg total) by mouth 3 (three) times daily as needed for spasms.   furosemide 40 MG tablet Commonly known as: LASIX TAKE 1 TABLET BY MOUTH  DAILY   hydrALAZINE 50 MG tablet Commonly known as: APRESOLINE Take 1 tablet (50 mg total) by mouth every 6 (six) hours.   insulin lispro 100 UNIT/ML KwikPen Commonly known as: HumaLOG KwikPen Inject 0.06 mLs (6 Units total) into the skin 3 (three) times daily. What changed: how much to take   Lantus SoloStar 100 UNIT/ML Solostar Pen Generic drug: Insulin Glargine Inject 10 Units into the skin at bedtime. And pen needles 4/day What changed: how much to take   LORazepam 0.5 MG tablet Commonly known as: ATIVAN Take 1 tablet (0.5 mg total) by mouth 2 (two) times daily as needed for anxiety.   metoprolol tartrate 25 MG tablet Commonly known as: LOPRESSOR Take 1 tablet (25 mg total) by mouth 2 (two) times daily.   mirtazapine 30 MG tablet Commonly known as: REMERON TAKE 1 TABLET BY MOUTH AT  BEDTIME   pantoprazole 40 MG tablet Commonly known as: PROTONIX TAKE 1 TABLET BY MOUTH  DAILY   promethazine 25 MG tablet Commonly known as: PHENERGAN Take 1 tablet (25 mg total) by mouth every 6 (six) hours as needed for nausea or vomiting.   Promethegan 12.5 MG suppository Generic drug: promethazine Place 12.5 mg rectally every 8 (eight) hours as needed for nausea or  vomiting.   traMADol 50 MG tablet Commonly known as: ULTRAM TAKE 1 TABLET(50 MG) BY MOUTH EVERY 6 HOURS AS NEEDED FOR MODERATE PAIN OR SEVERE PAIN      Allergies  Allergen Reactions  . Other Anaphylaxis    Reaction to [place] nuts   . Lactose Intolerance (Gi) Diarrhea      The results of significant diagnostics from this hospitalization (including imaging, microbiology, ancillary and laboratory) are listed below for reference.    Significant Diagnostic Studies: No results found.  Microbiology: Recent Results (from the past 240 hour(s))  SARS CORONAVIRUS 2 (TAT 6-24 HRS) Nasopharyngeal Nasopharyngeal Swab     Status: None   Collection Time: 04/30/19  5:42 AM   Specimen: Nasopharyngeal Swab  Result Value Ref Range Status   SARS Coronavirus 2 NEGATIVE NEGATIVE Final    Comment: (NOTE) SARS-CoV-2 target nucleic acids are NOT DETECTED. The SARS-CoV-2 RNA is generally detectable in upper and lower respiratory specimens during the acute phase of infection. Negative results do not preclude SARS-CoV-2 infection, do not rule out co-infections with other pathogens, and should not  be used as the sole basis for treatment or other patient management decisions. Negative results must be combined with clinical observations, patient history, and epidemiological information. The expected result is Negative. Fact Sheet for Patients: SugarRoll.be Fact Sheet for Healthcare Providers: https://www.woods-mathews.com/ This test is not yet approved or cleared by the [place] FDA and  has been authorized for detection and/or diagnosis of SARS-CoV-2 by FDA under an Emergency Use Authorization (EUA). This EUA will remain  in effect (meaning this test can be used) for the duration of the COVID-19 declaration under Section 56 4(b)(1) of the Act, 21 U.S.C. section 360bbb-3(b)(1), unless the authorization is terminated or revoked sooner. Performed at [facility], [place] [address], [place], [place] [address]      Labs: Basic Metabolic Panel: Recent Labs  Lab 04/24/19 1310 04/30/19 0257 04/30/19 1225 04/30/19 2039 05/01/19 0323  NA 134* 139  --   --  138  K 3.3* 2.6* 2.5* 3.8 3.4*  CL 91* 94*  --   --  105  CO2 28 33*  --   --  26  GLUCOSE 392* 244*  --   --  104*  BUN 17 11  --   --  9  CREATININE 0.96 0.86  --   --  0.65  CALCIUM 9.0 8.5*  --   --  7.9*  MG  --  1.6*  --   --  1.8   Liver Function Tests: Recent Labs  Lab 04/24/19 1310 04/30/19 0257  AST 26 21  ALT 31 20  ALKPHOS 95 78  BILITOT 1.1 0.8  PROT 8.4* 7.1  ALBUMIN 3.9 3.4*   Recent Labs  Lab 04/24/19 1310 04/30/19 0257  LIPASE 19 17   No results for input(s): AMMONIA in the last 168 hours. CBC: Recent Labs  Lab 04/24/19 1310 04/30/19 0257 05/01/19 0323  WBC 6.1 5.5 5.9  NEUTROABS 4.7  --   --   HGB 10.7* 9.7* 9.0*  HCT 32.0* 30.3* 27.8*  MCV 82.7 84.6 87.4  PLT 245 284 226   Cardiac Enzymes: No results for input(s): CKTOTAL, CKMB, CKMBINDEX, TROPONINI in the last 168 hours. BNP: BNP (last 3 results) No results for input(s): BNP in the last 8760 hours.  ProBNP (last 3 results) No results for input(s): PROBNP in the last 8760 hours.  CBG: Recent Labs  Lab 04/30/19 2032 05/01/19 0208 05/01/19 0239 05/01/19 0311 05/01/19 0744  GLUCAP 134* 45* 62* 93 144*       Signed:  [name] MD   [facility] 05/01/2019, 9:13 AM

## 2019-05-01 NOTE — Plan of Care (Addendum)
Pt's K level was 3.4 this am despite receiving 6 runs of K in the last 24 hrs. On-call notified.   Pt experienced pain  (chronic/back)@ 7/10 and an 8/10 and rec'd 2 mg Morphine @ 7915 and 0433.   Pt experienced hypoglycemic event. Please see above note.  Pt denied nausea this shift and was able to drink several glasses of chicken broth.

## 2019-05-01 NOTE — TOC Initial Note (Addendum)
Transition of Care [facility]) - Initial/Assessment Note    Patient Details  Name: [name] MRN: [id number] Date of Birth: Sep 26, 1990  Transition of Care [facility]) CM/SW Contact:    Norina Buzzard, RN Phone Number: 05/01/2019, 3:32 PM  Clinical Narrative: 29 yo F admitted with N & V. Hx of diabetic gastroparesis. Received referral to ensure that she has a f/u with a physician in 1 to 2 weeks. Spoke with pt. She plans to return home with the support of her husband. Dr. Jerline Pain is her PCP and Dr. Ebony Hail is her endocrinologist. She reports that she keeps up with her appointments. She denies any issues filling her prescriptions. She reports she is having some issues this year with insurance coverage for her continue glucose monitor. Her insurance is not covering the monitor like they used to. Encourage pt to contact her endocrinologist and discuss the issues she is having with her continue glucose monitor coverage. She verbalized understanding. Also discussed with pt the importance to f/u with her PCP after being admitted in the hospital. Encouraged pt to contact her PCP tomorrow morning to make a f/u appt. She verbalized understanding.              Expected Discharge Plan: Home/Self Care Barriers to Discharge: Other (comment)   Patient Goals and CMS Choice Patient states their goals for this hospitalization and ongoing recovery are:: To get better      Expected Discharge Plan and Services Expected Discharge Plan: Home/Self Care   Discharge Planning Services: CM Consult   Living arrangements for the past 2 months: Single Family Home Expected Discharge Date: 05/01/19                                    Prior Living Arrangements/Services Living arrangements for the past 2 months: Single Family Home Lives with:: Spouse Patient language and need for interpreter reviewed:: Yes Do you feel safe going back to the place where you live?: Yes               Activities of  Daily Living Home Assistive Devices/Equipment: Stimulators, Other (Comment)(Continuous Glucose Monitor) ADL Screening (condition at time of admission) Patient's cognitive ability adequate to safely complete daily activities?: Yes Is the patient deaf or have difficulty hearing?: No Does the patient have difficulty seeing, even when wearing glasses/contacts?: No Does the patient have difficulty concentrating, remembering, or making decisions?: No Patient able to express need for assistance with ADLs?: No Does the patient have difficulty dressing or bathing?: No Independently performs ADLs?: Yes (appropriate for developmental age) Does the patient have difficulty walking or climbing stairs?: No Weakness of Legs: None Weakness of Arms/Hands: None  Permission Sought/Granted Permission sought to share information with : Case Manager                Emotional Assessment Appearance:: Appears stated age Attitude/Demeanor/Rapport: Gracious, Engaged Affect (typically observed): Accepting, Calm, Stable, Appropriate Orientation: : Oriented to Self, Oriented to Place, Oriented to  Time, Oriented to Situation      Admission diagnosis:  Hypokalemia [E87.6] Nausea & vomiting [R11.2] Diabetic gastroparesis ([place]) [E11.43, K31.84] Intractable nausea and vomiting [R11.2] Intractable vomiting with nausea, unspecified vomiting type [R11.2] Hypertension, unspecified type [I10] Patient Active Problem List   Diagnosis Date Noted  . Nausea & vomiting 04/30/2019  . Hypertensive urgency 04/30/2019  . Intractable nausea and vomiting 04/30/2019  . Contraception management 08/06/2018  . Cyclical  vomiting syndrome 06/07/2018  . Diabetic gastroparesis associated with type 1 diabetes mellitus ([place]) 06/04/2018  . Anxiety 05/31/2018  . Peripheral edema 05/31/2018  . Normocytic anemia 07/10/2017  . GERD (gastroesophageal reflux disease) 06/23/2017  . MDD (major depressive disorder), recurrent episode, mild  ([place]) 02/28/2017  . Diabetes mellitus type 1 ([place]) 04/28/2013  . Protein-calorie malnutrition, severe ([place]) 04/28/2013  . Hypertension associated with diabetes ([place]) 11/14/2011  . Chronic abdominal pain 09/18/2011   PCP:  [name], MD Pharmacy:   [facility], [place] [address] [place] [address] Phone: [phone] Fax: [place], [place] [address] Phone: [phone] Fax: [phone]     Social Determinants of Health (SDOH) Interventions    Readmission Risk Interventions Readmission Risk Prevention Plan 07/30/2018 06/09/2018  Transportation Screening Complete Complete  PCP or Specialist Appt within 3-5 Days Complete -  HRI or [place] Not Complete -  [place] or Home Care Consult comments NA -  Social Work Consult for [place] Planning/Counseling Not Complete -  SW consult not completed comments NA -  Palliative Care Screening Not Applicable -  Medication Review Press photographer) (No Data) Complete  PCP or Specialist appointment within 3-5 days of discharge - Complete  HRI or [place] - Complete  SW Recovery Care/Counseling Consult - Complete  [place] - Not Applicable  Some recent data might be hidden

## 2019-05-02 NOTE — Telephone Encounter (Signed)
Rx request 

## 2019-05-09 ENCOUNTER — Other Ambulatory Visit: Payer: Self-pay | Admitting: Family Medicine

## 2019-05-12 NOTE — Discharge Summary (Signed)
Kathryn Sells, MD    Kathryn Sells, MD  Physician  Hospitalist     Progress Notes     Signed     Date of Service:  05/01/2019  8:49 AM                  Signed          Expand AllCollapse All            Expand widget buttonCollapse widget button                 customization button                                                                                                          Physician Discharge Summary    [name] NOI:[id number] DOB: 1990/04/08 DOA: 04/30/2019     PCP: [name], MD     Admit date: 04/30/2019  Discharge date: 05/01/2019     Time spent: 25 minutes     Recommendations for Outpatient Follow-up:   1.Patient recommended to get close follow-up in the outpatient setting refill Bentyl this admission   2.Ensure overall compliance with plan of care resuming usual home meds medication on discharge   3.Suggest outpatient nephropathy ophthalmopathy skin screening and vaccinations      Discharge Diagnoses:   Principal Problem:    Nausea & vomiting  Active Problems:    Chronic abdominal pain    Diabetes mellitus type 1 ([place])    Diabetic gastroparesis associated with type 1 diabetes mellitus (H[facility])    Hypertensive urgency    Intractable nausea and vomiting        Discharge Condition: Improved     Diet recommendation: Diabetic         Filed Weights        04/30/19 0235    Weight:   63.5 kg          History of present illness:   29 year old black female DM ty 2, prior DKA, anxiety depression, severe gastroparesis with over 13 emergency room visits in the past month  Read presented after being seen in the emergency room 1/24 again on 1/30-have been using her Phenergan reports that her triggering and inciting  factors are the onset of her menstrual cycle found to be significantly hypokalemic tachycardic     Data Reviewed:   Potassium 3.1 BUN/creatinine 9/0.6 calcium seven  Hemoglobin 9.0 WBC 5.9 platelet 226     Diabetic gastroparesis  Initially kept on clear liquids advance diet was able to keep it down felt to have met multiple parameters for discharge has all medications including Reglan Phenergan etc.  Did refill at her request outpatient dicyclomine 20 mg 3 times daily and gave 1 refill knows to follow-up with regular physicians  DM TY two with complication of hypoglycemia alternating with hyperglycemia-A1c 7.8  Usually on Lantus 40, lispro 6 units 3 times daily  Blood sugars were low 60-144  on admission however she can self manage  Hypokalemia  Received IV pushes of potassium and seems to be normalizing  Needs Chem-7 outpatient  Anxiety depression  Continue Remeron 30 at bedtime, lorazepam 0.5 twice daily  Reported fibromyalgia  Continue Flexeril 10 mg 3 times daily muscle spasm  LVH on echo 02/1817  Does not seem acute-Lasix was held on admission but resumed on discharge  Continue metoprolol 25 twice daily           Discharge Exam:       Vitals:        05/01/19 0212   05/01/19 0519    BP:   (!) 131/99   (!) 151/104    Pulse:   83   90    Resp:       18    Temp:       98.5 F (36.9 C)    SpO2:       99%          General: Awake coherent no distress EOMI NCAT tolerating diet sitting up in bed playing on her iPad  Cardiovascular: S1-S2 no murmur rub or gallop sinus rhythm on monitors  Respiratory: Clinically clear no added sound  Abdomen soft no rebound no guarding  Neurologically is intact     Discharge Instructions             Discharge Instructions          Diet - low sodium heart healthy     Complete by: As directed           Discharge instructions     Complete by: As  directed           We have refilled your medication B[place] to prevent you from getting recurrences of cyclical vomiting-it is advisable to continue the good work that you are doing with regards to blood glucose control as this will prevent you from getting further episodes of gastroparesis   Your A1c was pretty well controlled compared to prior at 7.8 which indicates that you are making good efforts to try and comply  I would recommend that you get I kidney and skin screening because of your longstanding diabetes in the outpatient setting and make sure that you get your usual preventative shots  Good luck and continue to do well and try to stay out of the emergency room-we will get a case manager involved in your care to ensure that you have resources and follow-up        Increase activity slowly     Complete by: As directed                      Allergies as of 05/01/2019                 Reactions         Other   Anaphylaxis        Reaction to [place] nuts        Lactose Intolerance (gi)   Diarrhea                    Medication List          TAKE these medications      cyclobenzaprine 10 MG tablet  Commonly known as: FLEXERIL  TAKE 1 TABLET(10 MG) BY MOUTH THREE TIMES DAILY AS NEEDED FOR MUSCLE SPASMS       Dexcom G6 Receiver Kerrin Mo  See admin instructions. USE  AS DIRECTED TO MONITOR GLUCOSE LEVELS       Dexcom G6 Sensor Misc  1 each by Does not apply route See admin instructions. every 10 days.       Dexcom G6 Transmitter Misc  See admin instructions. USE AS DIRECTED TO MONITOR GLUCOSE LEVELS       dicyclomine 20 MG tablet  Commonly known as: BENTYL  Take 1 tablet (20 mg total) by mouth 3 (three) times daily as needed for spasms.       furosemide 40 MG tablet  Commonly known as: LASIX  TAKE 1 TABLET BY MOUTH  DAILY       hydrALAZINE 50 MG tablet  Commonly known as: APRESOLINE  Take  1 tablet (50 mg total) by mouth every 6 (six) hours.       insulin lispro 100 UNIT/ML KwikPen  Commonly known as: HumaLOG KwikPen  Inject 0.06 mLs (6 Units total) into the skin 3 (three) times daily.  What changed: how much to take       Lantus SoloStar 100 UNIT/ML Solostar Pen  Generic drug: Insulin Glargine  Inject 10 Units into the skin at bedtime. And pen needles 4/day  What changed: how much to take       LORazepam 0.5 MG tablet  Commonly known as: ATIVAN  Take 1 tablet (0.5 mg total) by mouth 2 (two) times daily as needed for anxiety.       metoprolol tartrate 25 MG tablet  Commonly known as: LOPRESSOR  Take 1 tablet (25 mg total) by mouth 2 (two) times daily.       mirtazapine 30 MG tablet  Commonly known as: REMERON  TAKE 1 TABLET BY MOUTH AT  BEDTIME       pantoprazole 40 MG tablet  Commonly known as: PROTONIX  TAKE 1 TABLET BY MOUTH  DAILY       promethazine 25 MG tablet  Commonly known as: PHENERGAN  Take 1 tablet (25 mg total) by mouth every 6 (six) hours as needed for nausea or vomiting.       Promethegan 12.5 MG suppository  Generic drug: promethazine  Place 12.5 mg rectally every 8 (eight) hours as needed for nausea or vomiting.       traMADol 50 MG tablet  Commonly known as: ULTRAM  TAKE 1 TABLET(50 MG) BY MOUTH EVERY 6 HOURS AS NEEDED FOR MODERATE PAIN OR SEVERE PAIN                      Allergies    Allergen   Reactions    .   Other   Anaphylaxis            Reaction to [place] nuts       .   Lactose Intolerance (Gi)   Diarrhea                   The results of significant diagnostics from this hospitalization (including imaging, microbiology, ancillary and laboratory) are listed below for reference.        Significant Diagnostic Studies:   Imaging Results          Microbiology:          Recent Results (from the past 240 hour(s))    SARS  CORONAVIRUS 2 (TAT 6-24 HRS) Nasopharyngeal Nasopharyngeal Swab     Status: None        Collection Time: 04/30/19  5:42 AM        Specimen:  Nasopharyngeal Swab    Result   Value   Ref Range   Status        SARS Coronavirus 2   NEGATIVE   NEGATIVE   Final            Comment:   (NOTE)  SARS-CoV-2 target nucleic acids are NOT DETECTED.  The SARS-CoV-2 RNA is generally detectable in upper and lower  respiratory specimens during the acute phase of infection. Negative  results do not preclude SARS-CoV-2 infection, do not rule out  co-infections with other pathogens, and should not be used as the  sole basis for treatment or other patient management decisions.  Negative results must be combined with clinical observations,  patient history, and epidemiological information. The expected  result is Negative.  Fact Sheet for Patients:  SugarRoll.be  Fact Sheet for Healthcare Providers:  https://www.woods-mathews.com/  This test is not yet approved or cleared by the [place] FDA and   has been authorized for detection and/or diagnosis of SARS-CoV-2 by  FDA under an Emergency Use Authorization (EUA). This EUA will remain   in effect (meaning this test can be used) for the duration of the  COVID-19 declaration under Section 56 4(b)(1) of the Act, 21 U.S.C.  section 360bbb-3(b)(1), unless the authorization is terminated or  revoked sooner.  Performed at [facility], [place] [address] [place], [place]  [address]             Labs:  Basic Metabolic Panel:  Last Labs                                                                                                                                                                                                                               Liver Function Tests:  Last Labs                                                                                             Last Labs  Last Labs        CBC:  Last Labs                                                                                                                     Cardiac Enzymes:   Last Labs        BNP:  BNP (last 3 results)   Recent Labs (within last 365 days)          ProBNP (last 3 results)   Recent Labs (within last 365 days)          CBG:  Last Labs                                                                                  Signed:     [name] MD    [facility]  05/01/2019, 9:13 AM

## 2019-05-21 ENCOUNTER — Observation Stay (HOSPITAL_COMMUNITY)
Admission: EM | Admit: 2019-05-21 | Discharge: 2019-05-22 | Disposition: A | Discharge status: Home / Self Care | Payer: 59 | Attending: Internal Medicine | Admitting: Internal Medicine

## 2019-05-21 ENCOUNTER — Other Ambulatory Visit: Payer: Self-pay

## 2019-05-21 ENCOUNTER — Encounter (HOSPITAL_COMMUNITY): Payer: Self-pay | Admitting: Family Medicine

## 2019-05-21 DIAGNOSIS — E1143 Type 2 diabetes mellitus with diabetic autonomic (poly)neuropathy: Secondary | ICD-10-CM | POA: Diagnosis present

## 2019-05-21 DIAGNOSIS — E739 Lactose intolerance, unspecified: Secondary | ICD-10-CM | POA: Insufficient documentation

## 2019-05-21 DIAGNOSIS — Z8249 Family history of ischemic heart disease and other diseases of the circulatory system: Secondary | ICD-10-CM | POA: Insufficient documentation

## 2019-05-21 DIAGNOSIS — M797 Fibromyalgia: Secondary | ICD-10-CM | POA: Insufficient documentation

## 2019-05-21 DIAGNOSIS — Z91018 Allergy to other foods: Secondary | ICD-10-CM | POA: Insufficient documentation

## 2019-05-21 DIAGNOSIS — I160 Hypertensive urgency: Secondary | ICD-10-CM | POA: Diagnosis present

## 2019-05-21 DIAGNOSIS — R1116 Cannabis hyperemesis syndrome: Secondary | ICD-10-CM | POA: Diagnosis present

## 2019-05-21 DIAGNOSIS — Z79899 Other long term (current) drug therapy: Secondary | ICD-10-CM | POA: Diagnosis not present

## 2019-05-21 DIAGNOSIS — Z794 Long term (current) use of insulin: Secondary | ICD-10-CM | POA: Diagnosis not present

## 2019-05-21 DIAGNOSIS — Z9049 Acquired absence of other specified parts of digestive tract: Secondary | ICD-10-CM | POA: Diagnosis not present

## 2019-05-21 DIAGNOSIS — U071 COVID-19: Principal | ICD-10-CM | POA: Insufficient documentation

## 2019-05-21 DIAGNOSIS — G8929 Other chronic pain: Secondary | ICD-10-CM | POA: Diagnosis not present

## 2019-05-21 DIAGNOSIS — R739 Hyperglycemia, unspecified: Secondary | ICD-10-CM | POA: Diagnosis present

## 2019-05-21 DIAGNOSIS — F419 Anxiety disorder, unspecified: Secondary | ICD-10-CM | POA: Diagnosis not present

## 2019-05-21 DIAGNOSIS — I10 Essential (primary) hypertension: Secondary | ICD-10-CM

## 2019-05-21 DIAGNOSIS — F329 Major depressive disorder, single episode, unspecified: Secondary | ICD-10-CM | POA: Insufficient documentation

## 2019-05-21 DIAGNOSIS — E1065 Type 1 diabetes mellitus with hyperglycemia: Secondary | ICD-10-CM | POA: Insufficient documentation

## 2019-05-21 DIAGNOSIS — K219 Gastro-esophageal reflux disease without esophagitis: Secondary | ICD-10-CM | POA: Diagnosis present

## 2019-05-21 DIAGNOSIS — N179 Acute kidney failure, unspecified: Secondary | ICD-10-CM

## 2019-05-21 DIAGNOSIS — R109 Unspecified abdominal pain: Secondary | ICD-10-CM | POA: Insufficient documentation

## 2019-05-21 DIAGNOSIS — K3184 Gastroparesis: Secondary | ICD-10-CM | POA: Insufficient documentation

## 2019-05-21 DIAGNOSIS — E1043 Type 1 diabetes mellitus with diabetic autonomic (poly)neuropathy: Secondary | ICD-10-CM | POA: Diagnosis present

## 2019-05-21 DIAGNOSIS — Z833 Family history of diabetes mellitus: Secondary | ICD-10-CM | POA: Insufficient documentation

## 2019-05-21 DIAGNOSIS — E1010 Type 1 diabetes mellitus with ketoacidosis without coma: Secondary | ICD-10-CM | POA: Insufficient documentation

## 2019-05-21 DIAGNOSIS — R112 Nausea with vomiting, unspecified: Secondary | ICD-10-CM | POA: Diagnosis present

## 2019-05-21 DIAGNOSIS — E1069 Type 1 diabetes mellitus with other specified complication: Secondary | ICD-10-CM | POA: Diagnosis not present

## 2019-05-21 DIAGNOSIS — E109 Type 1 diabetes mellitus without complications: Secondary | ICD-10-CM | POA: Diagnosis present

## 2019-05-21 LAB — COMPREHENSIVE METABOLIC PANEL
ALT: 56 U/L — ABNORMAL HIGH (ref 0–44)
AST: 25 U/L (ref 15–41)
Albumin: 3.9 g/dL (ref 3.5–5.0)
Alkaline Phosphatase: 103 U/L (ref 38–126)
Anion gap: 15 (ref 5–15)
BUN: 24 mg/dL — ABNORMAL HIGH (ref 6–20)
CO2: 26 mmol/L (ref 22–32)
Calcium: 9.7 mg/dL (ref 8.9–10.3)
Chloride: 94 mmol/L — ABNORMAL LOW (ref 98–111)
Creatinine, Ser: 1.09 mg/dL — ABNORMAL HIGH (ref 0.44–1.00)
GFR calc Af Amer: >60 mL/min (ref >60)
GFR calc non Af Amer: >60 mL/min (ref >60)
Glucose, Bld: 395 mg/dL — ABNORMAL HIGH (ref 70–99)
Potassium: 4.1 mmol/L (ref 3.5–5.1)
Sodium: 135 mmol/L (ref 135–145)
Total Bilirubin: 0.7 mg/dL (ref 0.3–1.2)
Total Protein: 8.7 g/dL — ABNORMAL HIGH (ref 6.5–8.1)

## 2019-05-21 LAB — CBC WITH DIFFERENTIAL/PLATELET
Abs Immature Granulocytes: 0.01 10*3/uL (ref 0.00–0.07)
Basophils Absolute: 0 10*3/uL (ref 0.0–0.1)
Basophils Relative: 1 %
Eosinophils Absolute: 0.2 10*3/uL (ref 0.0–0.5)
Eosinophils Relative: 4 %
HCT: 37.2 % (ref 36.0–46.0)
Hemoglobin: 12.1 g/dL (ref 12.0–15.0)
Immature Granulocytes: 0 %
Lymphocytes Relative: 33 %
Lymphs Abs: 1.5 10*3/uL (ref 0.7–4.0)
MCH: 27.1 pg (ref 26.0–34.0)
MCHC: 32.5 g/dL (ref 30.0–36.0)
MCV: 83.4 fL (ref 80.0–100.0)
Monocytes Absolute: 0.2 10*3/uL (ref 0.1–1.0)
Monocytes Relative: 4 %
Neutro Abs: 2.6 10*3/uL (ref 1.7–7.7)
Neutrophils Relative %: 58 %
Platelets: 224 10*3/uL (ref 150–400)
RBC: 4.46 MIL/uL (ref 3.87–5.11)
RDW: 12.8 % (ref 11.5–15.5)
WBC: 4.5 10*3/uL (ref 4.0–10.5)
nRBC: 0 % (ref 0.0–0.2)

## 2019-05-21 LAB — BLOOD GAS, VENOUS
Acid-Base Excess: 2.5 mmol/L — ABNORMAL HIGH (ref 0.0–2.0)
Bicarbonate: 28.4 mmol/L — ABNORMAL HIGH (ref 20.0–28.0)
O2 Saturation: 74.5 %
Patient temperature: 98.6
pCO2, Ven: 54 mmHg (ref 44.0–60.0)
pH, Ven: 7.34 (ref 7.250–7.430)
pO2, Ven: 44.1 mmHg (ref 32.0–45.0)

## 2019-05-21 LAB — URINALYSIS, ROUTINE W REFLEX MICROSCOPIC
Bacteria, UA: NONE SEEN
Bilirubin Urine: NEGATIVE
Glucose, UA: >=500 mg/dL — AB
Ketones, ur: 20 mg/dL — AB
Leukocytes,Ua: NEGATIVE
Nitrite: NEGATIVE
Protein, ur: 100 mg/dL — AB
Specific Gravity, Urine: 1.017 (ref 1.005–1.030)
pH: 6 (ref 5.0–8.0)

## 2019-05-21 LAB — GLUCOSE, CAPILLARY
Glucose-Capillary: 339 mg/dL — ABNORMAL HIGH (ref 70–99)
Glucose-Capillary: 319 mg/dL — ABNORMAL HIGH (ref 70–99)
Glucose-Capillary: 209 mg/dL — ABNORMAL HIGH (ref 70–99)

## 2019-05-21 LAB — CBG MONITORING, ED: Glucose-Capillary: 350 mg/dL — ABNORMAL HIGH (ref 70–99)

## 2019-05-21 LAB — RAPID URINE DRUG SCREEN, HOSP PERFORMED
Amphetamines: NOT DETECTED
Barbiturates: NOT DETECTED
Benzodiazepines: NOT DETECTED
Cocaine: NOT DETECTED
Opiates: NOT DETECTED
Tetrahydrocannabinol: POSITIVE — AB

## 2019-05-21 LAB — I-STAT BETA HCG BLOOD, ED (MC, WL, AP ONLY): I-stat hCG, quantitative: <5 m[IU]/mL (ref <5)

## 2019-05-21 LAB — LIPASE, BLOOD: Lipase: 22 U/L (ref 11–51)

## 2019-05-21 LAB — SARS CORONAVIRUS 2 (TAT 6-24 HRS): SARS Coronavirus 2: POSITIVE — AB

## 2019-05-21 MED ORDER — PROMETHAZINE HCL 25 MG/ML IJ SOLN: 12.5000 mg | Freq: Four times a day (QID) | INTRAMUSCULAR | Status: DC | PRN

## 2019-05-21 MED ORDER — METOCLOPRAMIDE HCL 5 MG/ML IJ SOLN
10.0000 mg | Freq: Three times a day (TID) | INTRAMUSCULAR | Status: DC
  Administered 2019-05-21 – 2019-05-22 (×4): 10 mg via INTRAVENOUS
  Filled 2019-05-21 (×4): qty 2

## 2019-05-21 MED ORDER — SODIUM CHLORIDE 0.9 % IV BOLUS
1000.0000 mL | Freq: Once | INTRAVENOUS | Status: AC
  Administered 2019-05-21: 1000 mL via INTRAVENOUS

## 2019-05-21 MED ORDER — INSULIN GLARGINE 100 UNIT/ML ~~LOC~~ SOLN
10.0000 [IU] | Freq: Every day | SUBCUTANEOUS | Status: DC
  Filled 2019-05-21: qty 0.1

## 2019-05-21 MED ORDER — ENOXAPARIN SODIUM 40 MG/0.4ML ~~LOC~~ SOLN
40.0000 mg | SUBCUTANEOUS | Status: DC
  Administered 2019-05-21: 40 mg via SUBCUTANEOUS
  Filled 2019-05-21: qty 0.4

## 2019-05-21 MED ORDER — MORPHINE SULFATE (PF) 2 MG/ML IV SOLN: 2.0000 mg | INTRAVENOUS | Status: DC | PRN

## 2019-05-21 MED ORDER — HYDRALAZINE HCL 50 MG PO TABS
50.0000 mg | ORAL_TABLET | Freq: Three times a day (TID) | ORAL | Status: DC
  Administered 2019-05-22 (×2): 50 mg via ORAL
  Filled 2019-05-21 (×2): qty 1

## 2019-05-21 MED ORDER — FENTANYL CITRATE (PF) 100 MCG/2ML IJ SOLN
50.0000 ug | Freq: Once | INTRAMUSCULAR | Status: AC
  Administered 2019-05-21: 50 ug via INTRAVENOUS
  Filled 2019-05-21: qty 2

## 2019-05-21 MED ORDER — HALOPERIDOL LACTATE 5 MG/ML IJ SOLN
5.0000 mg | Freq: Once | INTRAMUSCULAR | Status: AC
  Administered 2019-05-21: 5 mg via INTRAVENOUS
  Filled 2019-05-21: qty 1

## 2019-05-21 MED ORDER — LABETALOL HCL 5 MG/ML IV SOLN
20.0000 mg | Freq: Once | INTRAVENOUS | Status: AC
  Administered 2019-05-21: 20 mg via INTRAVENOUS
  Filled 2019-05-21: qty 4

## 2019-05-21 MED ORDER — INSULIN ASPART 100 UNIT/ML ~~LOC~~ SOLN
0.0000 [IU] | SUBCUTANEOUS | Status: DC
  Administered 2019-05-21: 7 [IU] via SUBCUTANEOUS
  Administered 2019-05-21: 3 [IU] via SUBCUTANEOUS
  Administered 2019-05-21: 7 [IU] via SUBCUTANEOUS
  Filled 2019-05-21: qty 0.09

## 2019-05-21 MED ORDER — METOPROLOL TARTRATE 25 MG PO TABS
25.0000 mg | ORAL_TABLET | Freq: Two times a day (BID) | ORAL | Status: DC
  Administered 2019-05-21 – 2019-05-22 (×3): 25 mg via ORAL
  Filled 2019-05-21 (×3): qty 1

## 2019-05-21 MED ORDER — ACETAMINOPHEN 650 MG RE SUPP: 650.0000 mg | Freq: Four times a day (QID) | RECTAL | Status: DC | PRN

## 2019-05-21 MED ORDER — SODIUM CHLORIDE 0.9 % IV SOLN: INTRAVENOUS | Status: DC

## 2019-05-21 MED ORDER — HYDROCODONE-ACETAMINOPHEN 5-325 MG PO TABS
1.0000 | ORAL_TABLET | Freq: Four times a day (QID) | ORAL | Status: DC | PRN
  Administered 2019-05-21 – 2019-05-22 (×3): 2 via ORAL
  Filled 2019-05-21 (×3): qty 2

## 2019-05-21 MED ORDER — ACETAMINOPHEN 325 MG PO TABS: 650.0000 mg | ORAL_TABLET | Freq: Four times a day (QID) | ORAL | Status: DC | PRN

## 2019-05-21 MED ORDER — INSULIN GLARGINE 100 UNIT/ML ~~LOC~~ SOLN
10.0000 [IU] | Freq: Every day | SUBCUTANEOUS | Status: DC
  Administered 2019-05-21: 10 [IU] via SUBCUTANEOUS
  Filled 2019-05-21 (×2): qty 0.1

## 2019-05-21 MED ORDER — METOPROLOL TARTRATE 5 MG/5ML IV SOLN: 5.0000 mg | Freq: Four times a day (QID) | INTRAVENOUS | Status: DC

## 2019-05-21 MED ORDER — ONDANSETRON HCL 4 MG/2ML IJ SOLN: 4.0000 mg | Freq: Four times a day (QID) | INTRAMUSCULAR | Status: DC | PRN

## 2019-05-21 NOTE — ED Triage Notes (Signed)
Pt presents to ED from home with c/o N/V and back since yesterday morning.

## 2019-05-21 NOTE — ED Notes (Signed)
Patient ambulatory to restroom  ?

## 2019-05-21 NOTE — ED Provider Notes (Addendum)
[facility] DEPT Provider Note: Georgena Spurling, MD, FACEP  CSN: [id number] MRN: [id number] ARRIVAL: 05/21/19 at [place]: [id number]   CHIEF COMPLAINT  Vomiting and Back Pain   HISTORY OF PRESENT ILLNESS  60/73/71 0:62 AM [name] is a 29 y.o. female with a history of type 1 diabetes and diabetic gastroparesis as well as chronic upper back pain for which she wears a TENS unit..  She is here with nausea and vomiting since yesterday morning.  She also has upper back pain which she rates as a 9 out of 10.  She thinks the back pain may have been exacerbated by the movement and force of vomiting.  She is having diffuse abdominal pain which is generalized but less severe than her back pain.  She characterizes the pain is like previous episodes of diabetic gastroparesis.  She has not been able to keep her home medications down.   Past Medical History:  Diagnosis Date  . Allergy   . Anemia   . Anxiety   . Blood transfusion without reported diagnosis    Dec 2018  . Cataract    right eye  . Depression   . Diabetes type 1, uncontrolled ([place]) 11/14/2011   Since age 63  . Fibromyalgia   . Gastroparesis   . GERD (gastroesophageal reflux disease)   . Hypertension   . Infection    UTI April 2016    Past Surgical History:  Procedure Laterality Date  . ANKLE SURGERY    . CHOLECYSTECTOMY  11/15/2011   Procedure: LAPAROSCOPIC CHOLECYSTECTOMY WITH INTRAOPERATIVE CHOLANGIOGRAM;  Surgeon: [name], MD;  Location: [facility];  Service: General;  Laterality: N/A;  . COLONOSCOPY    . COLONOSCOPY WITH PROPOFOL N/A 06/27/2017   Procedure: COLONOSCOPY WITH PROPOFOL;  Surgeon: Milus Banister, MD;  Location: [facility];  Service: Endoscopy;  Laterality: N/A;  . ESOPHAGOGASTRODUODENOSCOPY  12/03/2011   Procedure: ESOPHAGOGASTRODUODENOSCOPY (EGD);  Surgeon: Beryle Beams, MD;  Location: [facility];  Service: Endoscopy;  Laterality: N/A;  . FLEXIBLE SIGMOIDOSCOPY N/A 03/10/2017   Procedure: FLEXIBLE SIGMOIDOSCOPY;  Surgeon: Carol Ada, MD;  Location: [facility];  Service: Endoscopy;  Laterality: N/A;  . INCISION AND DRAINAGE PERIRECTAL ABSCESS N/A 03/01/2017   Procedure: IRRIGATION AND DEBRIDEMENT PERIRECTAL ABSCESS;  Surgeon: Alphonsa Overall, MD;  Location: [facility];  Service: General;  Laterality: N/A;  . IRRIGATION AND DEBRIDEMENT BUTTOCKS N/A 03/23/2017   Procedure: IRRIGATION AND DEBRIDEMENT BUTTOCKS, SETON PLACEMENT;  Surgeon: [name], MD;  Location: [facility];  Service: General;  Laterality: N/A;  . LAPAROSCOPY  11/23/2011   Procedure: LAPAROSCOPY DIAGNOSTIC;  Surgeon: Edward Jolly, MD;  Location: [facility];  Service: General;  Laterality: N/A;  . SIGMOIDOSCOPY    . UPPER GASTROINTESTINAL ENDOSCOPY    . WISDOM TOOTH EXTRACTION      Family History  Problem Relation Age of Onset  . Diabetes Mother   . Hypertension Father   . Colon cancer Paternal Grandmother        pt thinks PGM was dx in her 75's  . Diabetes Paternal Grandmother   . Diabetes Maternal Grandmother   . Diabetes Maternal Grandfather   . Diabetes Paternal Grandfather   . Diabetes Other   . Esophageal cancer Neg Hx   . Liver cancer Neg Hx   . Pancreatic cancer Neg Hx   . Stomach cancer Neg Hx   . Rectal cancer Neg Hx     Social History   Tobacco Use  . Smoking  status: Never Smoker  . Smokeless tobacco: Never Used  Substance Use Topics  . Alcohol use: No    Alcohol/week: 0.0 standard drinks  . Drug use: Yes    Types: Marijuana    Prior to Admission medications   Medication Sig Start Date End Date Taking? Authorizing Provider  Continuous Blood Gluc Receiver ([place]) [place] See admin instructions. USE AS DIRECTED TO MONITOR GLUCOSE LEVELS 10/29/18   [provider]  Continuous Blood Gluc Sensor (DEXCOM G6 SENSOR) MISC 1 each by Does not apply route See admin instructions. every 10 days. 12/31/18   Renato Shin, MD  Continuous Blood Gluc Transmit (DEXCOM G6  TRANSMITTER) MISC See admin instructions. USE AS DIRECTED TO MONITOR GLUCOSE LEVELS 10/29/18   [provider]  cyclobenzaprine (FLEXERIL) 10 MG tablet TAKE 1 TABLET(10 MG) BY MOUTH THREE TIMES DAILY AS NEEDED FOR MUSCLE SPASMS 04/29/19   [name], MD  dicyclomine (BENTYL) 20 MG tablet Take 1 tablet (20 mg total) by mouth 3 (three) times daily as needed for spasms. 05/01/19   [name], MD  furosemide (LASIX) 40 MG tablet TAKE 1 TABLET BY MOUTH  DAILY Patient taking differently: Take 40 mg by mouth daily.  11/11/18   Armbruster, Carlota Raspberry, MD  hydrALAZINE (APRESOLINE) 50 MG tablet Take 1 tablet (50 mg total) by mouth every 6 (six) hours. 10/23/18   Swayze, Ava, DO  Insulin Glargine (LANTUS SOLOSTAR) 100 UNIT/ML Solostar Pen Inject 10 Units into the skin at bedtime. And pen needles 4/day Patient taking differently: Inject 12 Units into the skin at bedtime. And pen needles 4/day 12/31/18   Renato Shin, MD  insulin lispro (HUMALOG KWIKPEN) 100 UNIT/ML KwikPen Inject 0.06 mLs (6 Units total) into the skin 3 (three) times daily. Patient taking differently: Inject 5 Units into the skin 3 (three) times daily.  12/31/18   Renato Shin, MD  LORazepam (ATIVAN) 0.5 MG tablet Take 1 tablet (0.5 mg total) by mouth 2 (two) times daily as needed for anxiety. 04/05/19   [name], MD  metoprolol tartrate (LOPRESSOR) 25 MG tablet Take 1 tablet (25 mg total) by mouth 2 (two) times daily. 10/23/18   Swayze, Ava, DO  mirtazapine (REMERON) 30 MG tablet TAKE 1 TABLET BY MOUTH AT  BEDTIME Patient taking differently: Take 30 mg by mouth at bedtime.  04/19/19   [name], MD  pantoprazole (PROTONIX) 40 MG tablet TAKE 1 TABLET BY MOUTH  DAILY Patient taking differently: Take 40 mg by mouth daily.  10/22/18   Armbruster, Carlota Raspberry, MD  promethazine (PHENERGAN) 12.5 MG tablet Take 1 tablet (12.5 mg total) by mouth every 8 (eight) hours as needed for nausea or vomiting. Please schedule an Office Visit  for further refills. 05/02/19   Armbruster, Carlota Raspberry, MD  promethazine (PHENERGAN) 25 MG tablet Take 1 tablet (25 mg total) by mouth every 6 (six) hours as needed for nausea or vomiting. 03/14/19   Drenda Freeze, MD  PROMETHEGAN 12.5 MG suppository Place 12.5 mg rectally every 8 (eight) hours as needed for nausea or vomiting.  03/14/19   [provider]  traMADol (ULTRAM) 50 MG tablet TAKE 1 TABLET(50 MG) BY MOUTH EVERY 6 HOURS AS NEEDED FOR MODERATE PAIN OR SEVERE PAIN 04/28/19   [name], MD  metoCLOPramide (REGLAN) 10 MG tablet Take 1 tablet (10 mg total) by mouth every 6 (six) hours as needed for nausea or vomiting. Patient not taking: Reported on 04/23/2019 02/20/18 04/23/19  Shelly Coss, MD  potassium chloride 20 MEQ TBCR Take 20 mEq by mouth 2 (two) times daily. Patient not taking: Reported on 03/31/2019 10/17/18 04/23/19  Isla Pence, MD  potassium chloride SA (KLOR-CON) 20 MEQ tablet Take 1 tablet (20 mEq total) by mouth daily. Patient not taking: Reported on 03/31/2019 03/14/19 04/23/19  Drenda Freeze, MD    Allergies Other and Lactose intolerance (gi)   REVIEW OF SYSTEMS  Negative except as noted here or in the History of Present Illness.   PHYSICAL EXAMINATION  Initial Vital Signs Blood pressure (!) 126/91, pulse (!) 110, temperature 97.7 F (36.5 C), temperature source Oral, resp. rate (!) 22, height 5' 7"  (1.702 m), weight 63.5 kg, last menstrual period 04/30/2019, SpO2 99 %.  Examination General: Well-developed, cachectic female in no acute distress; appears much older than age of record HENT: normocephalic; atraumatic; edentulous Eyes: pupils equal, round and reactive to light; extraocular muscles intact Neck: supple Heart: regular rate and rhythm; tachycardia Lungs: clear to auscultation bilaterally Abdomen: soft; nondistended; diffusely tender; no masses or hepatosplenomegaly; bowel sounds hypoactive Back: TENS pads in place Extremities:  No deformity; full range of motion; pulses normal Neurologic: Awake, alert and oriented; motor function intact in all extremities and symmetric; no facial droop Skin: Warm and dry Psychiatric: Tearful   RESULTS  Summary of this visit's results, reviewed and interpreted by myself:   EKG Interpretation  Date/Time:    Ventricular Rate:    PR Interval:    QRS Duration:   QT Interval:    QTC Calculation:   R Axis:     Text Interpretation:        Laboratory Studies: Results for orders placed or performed during the hospital encounter of 05/21/19 (from the past 24 hour(s))  CBC with Differential/Platelet     Status: None   Collection Time: 05/21/19  5:26 AM  Result Value Ref Range   WBC 4.5 4.0 - 10.5 K/uL   RBC 4.46 3.87 - 5.11 MIL/uL   Hemoglobin 12.1 12.0 - 15.0 g/dL   HCT 37.2 36.0 - 46.0 %   MCV 83.4 80.0 - 100.0 fL   MCH 27.1 26.0 - 34.0 pg   MCHC 32.5 30.0 - 36.0 g/dL   RDW 12.8 11.5 - 15.5 %   Platelets 224 150 - 400 K/uL   nRBC 0.0 0.0 - 0.2 %   Neutrophils Relative % 58 %   Neutro Abs 2.6 1.7 - 7.7 K/uL   Lymphocytes Relative 33 %   Lymphs Abs 1.5 0.7 - 4.0 K/uL   Monocytes Relative 4 %   Monocytes Absolute 0.2 0.1 - 1.0 K/uL   Eosinophils Relative 4 %   Eosinophils Absolute 0.2 0.0 - 0.5 K/uL   Basophils Relative 1 %   Basophils Absolute 0.0 0.0 - 0.1 K/uL   Immature Granulocytes 0 %   Abs Immature Granulocytes 0.01 0.00 - 0.07 K/uL  Comprehensive metabolic panel     Status: Abnormal   Collection Time: 05/21/19  5:26 AM  Result Value Ref Range   Sodium 135 135 - 145 mmol/L   Potassium 4.1 3.5 - 5.1 mmol/L   Chloride 94 (L) 98 - 111 mmol/L   CO2 26 22 - 32 mmol/L   Glucose, Bld 395 (H) 70 - 99 mg/dL   BUN 24 (H) 6 - 20 mg/dL   Creatinine, Ser 1.09 (H) 0.44 - 1.00 mg/dL   Calcium 9.7 8.9 - 10.3 mg/dL   Total Protein 8.7 (H) 6.5 - 8.1  g/dL   Albumin 3.9 3.5 - 5.0 g/dL   AST 25 15 - 41 U/L   ALT 56 (H) 0 - 44 U/L   Alkaline Phosphatase 103 38 - 126 U/L     Total Bilirubin 0.7 0.3 - 1.2 mg/dL   GFR calc non Af Amer >60 >60 mL/min   GFR calc Af Amer >60 >60 mL/min   Anion gap 15 5 - 15  Lipase, blood     Status: None   Collection Time: 05/21/19  5:26 AM  Result Value Ref Range   Lipase 22 11 - 51 U/L  CBG monitoring, ED     Status: Abnormal   Collection Time: 05/21/19  6:13 AM  Result Value Ref Range   Glucose-Capillary 350 (H) 70 - 99 mg/dL  I-Stat Beta hCG blood, ED ([facility], [facility], [facility] only)     Status: None   Collection Time: 05/21/19  6:39 AM  Result Value Ref Range   I-stat hCG, quantitative <5.0 <5 mIU/mL   Comment 3           Imaging Studies: No results found.  ED COURSE and MDM  Nursing notes, initial and subsequent vitals signs, including pulse oximetry, reviewed and interpreted by myself.  Vitals:   05/21/19 0517 05/21/19 0518 05/21/19 0658  BP:  (!) 126/91 (!) 182/111  Pulse:  (!) 110 (!) 110  Resp:  (!) 22 18  Temp:  97.7 F (36.5 C)   TempSrc:  Oral   SpO2:  99% 100%  Weight: 63.5 kg    Height: 5' 7"  (1.702 m)     Medications  sodium chloride 0.9 % bolus 1,000 mL (has no administration in time range)  fentaNYL (SUBLIMAZE) injection 50 mcg (has no administration in time range)  sodium chloride 0.9 % bolus 1,000 mL (1,000 mLs Intravenous New Bag/Given 05/21/19 0646)  haloperidol lactate (HALDOL) injection 5 mg (5 mg Intravenous Given 05/21/19 0653)  fentaNYL (SUBLIMAZE) injection 50 mcg (50 mcg Intravenous Given 05/21/19 0654)   7:21 AM Patient given IV fluid bolus, Haldol (patient states this works best for her nausea and vomiting) and fentanyl.  Patient signed out to Dr. Eulis Foster.    PROCEDURES  Procedures   ED DIAGNOSES     ICD-10-CM   1. Diabetic gastroparesis ([place])  E11.43    K31.84   2. Hyperglycemia  R73.9   3. Chronic bilateral thoracic back pain  M54.6    G89.29        Laylonie Marzec, Jenny Reichmann, MD 05/21/19 [id number]    Shanon Rosser, MD 05/21/19 [phone]

## 2019-05-21 NOTE — ED Notes (Signed)
Hospitalist at bedside 

## 2019-05-21 NOTE — ED Provider Notes (Signed)
7:73 AM-she presented for her usual nausea and vomiting.  As of now labs pending for evaluation of metabolic status.  We will continue to observe and treat as needed.  Clinical Course as of May 20 1122  Sat May 21, 2019  0906 Normal except bicarb high, acid-base high  Blood gas, venous (at [facility] and [facility], not at [facility])(!) [EW]  0906 Normal  I-Stat Beta hCG blood, ED ([facility], [facility], [facility] only) [EW]  0906 Normal  CBC with Differential/Platelet [EW]  0906 Elevated  CBG monitoring, ED(!) [EW]  0906 Normal  Lipase, blood [EW]  0906 Normal except chloride low, glucose high, BUN high, creatinine high, total protein high, ALT high  Comprehensive metabolic panel(!) [EW]  2947 She remains tachycardic after initial IV bolus, will give additional liter IV bolus.   [EW]    Clinical Course User Index [EW] Daleen Bo, MD     Patient Vitals for the past 24 hrs:  BP Temp Temp src Pulse Resp SpO2 Height Weight  05/21/19 1123 (!) 167/129 -- -- (!) 114 (!) 22 100 % -- --  05/21/19 1051 (!) 208/125 97.7 F (36.5 C) Oral (!) 114 18 100 % -- --  05/21/19 0800 (!) 161/104 -- -- (!) 105 (!) 21 100 % -- --  05/21/19 0658 (!) 182/111 -- -- (!) 110 18 100 % -- --  05/21/19 0518 (!) 126/91 97.7 F (36.5 C) Oral (!) 110 (!) 22 99 % -- --  05/21/19 0517 -- -- -- -- -- -- 5' 7"  (1.702 m) 63.5 kg    11:13 AM Reevaluation with update and discussion. After initial assessment and treatment, an updated evaluation reveals she complains of back pain and abdominal pain, "when you touch it."  The abdomen is mildly tender.  She has facial grimacing, as she talks about her pain.  No dysarthria or aphasia.  No complaint of headache.  She states she has not been able to tolerate her hydralazine for several days because of the vomiting.  She states she has not seen her GI doctor "for a while."  We will contact hospitalist for admission. Daleen Bo   Medical Decision Making: Recurrent nausea and vomiting, with frequent evaluations  in the ED and hospitalization.  Apparently not seeing GI regularly.  She is felt to have diabetic gastroparesis.  She is not in DKA at this time.  She has mild hyperglycemia.  She has not improved with aggressive ED treatment therefore will require hospitalization.  Blood pressure elevated, likely related to her not being able to tolerate her oral medications.  Doubt hypertensive urgency.  Suspect dehydration secondary to vomiting, requiring IV fluid boluses.  CRITICAL CARE-yes Performed by: Daleen Bo   .Critical Care Performed by: Daleen Bo, MD Authorized by: Daleen Bo, MD   Critical care provider statement:    Critical care time (minutes):  35   Critical care start time:  05/21/2019 7:23 AM   Critical care end time:  05/21/2019 11:21 AM   Critical care time was exclusive of:  Separately billable procedures and treating other patients   Critical care was necessary to treat or prevent imminent or life-threatening deterioration of the following conditions:  Circulatory failure   Critical care was time spent personally by me on the following activities:  Blood draw for specimens, development of treatment plan with patient or surrogate, discussions with consultants, evaluation of patient's response to treatment, examination of patient, obtaining history from patient or surrogate, ordering and performing treatments and interventions, ordering and  review of laboratory studies, pulse oximetry, re-evaluation of patient's condition, review of old charts and ordering and review of radiographic studies   11:21 AM-Consult complete with hospitalist. Patient case explained and discussed.  He agrees to admit patient for further evaluation and treatment. Call ended at 11:42 AM  Nursing Notes Reviewed/ Care Coordinated Applicable Imaging Reviewed Interpretation of Laboratory Data incorporated into ED treatment  Plan: Admit     Daleen Bo, MD 05/21/19 1143

## 2019-05-21 NOTE — H&P (Signed)
History and Physical    [name] MGQ:[id number] DOB: 1990/08/29 DOA: 05/21/2019  PCP: [name], MD Patient coming from: Home  Chief Complaint: Intractable nausea/vomiting  HPI: [name] is a 29 y.o. female with medical history significant of anxiety, depression, diabetes mellitus type 1, gastroparesis, GERD, hypertension.  Patient presents secondary to a few days of nausea and vomiting.  She reports using Phenergan and Bentyl with no help in her symptoms.  She has associated abdominal pain and diarrhea.  She also reports some intermittent dyspnea.  Afebrile, no chills.  She reports using marijuana intermittently because it helps with her abdominal pain and nausea/vomiting.  She has seen Dr. Havery Moros with [facility] and is apparently recommended to follow-up with [facility] but has not gone yet secondary "to COVID-19"  ED Course: Vitals: Temperature of 97.7 F, pulse of 114 > 94, respirations of 18-20, blood pressure of 167/129 > 137/95, SPO2 of 96% on room air Labs: Glucose of 395, creatinine of 1.09, ALT of 56 Imaging: None Medications/Course: Fentanyl, Haldol IV, 3 L normal saline bolus via IV  Review of Systems: Review of Systems  Constitutional: Negative for chills and fever.  Respiratory: Positive for shortness of breath.   Cardiovascular: Negative for chest pain.  Gastrointestinal: Positive for abdominal pain, diarrhea, nausea and vomiting. Negative for constipation.  All other systems reviewed and are negative.   Past Medical History:  Diagnosis Date  . Allergy   . Anemia   . Anxiety   . Blood transfusion without reported diagnosis    Dec 2018  . Cataract    right eye  . Depression   . Diabetes type 1, uncontrolled ([place]) 11/14/2011   Since age 16  . Fibromyalgia   . Gastroparesis   . GERD (gastroesophageal reflux disease)   . Hypertension   . Infection    UTI April 2016    Past Surgical History:  Procedure Laterality Date  . ANKLE SURGERY     . CHOLECYSTECTOMY  11/15/2011   Procedure: LAPAROSCOPIC CHOLECYSTECTOMY WITH INTRAOPERATIVE CHOLANGIOGRAM;  Surgeon: [name], MD;  Location: [facility];  Service: General;  Laterality: N/A;  . COLONOSCOPY    . COLONOSCOPY WITH PROPOFOL N/A 06/27/2017   Procedure: COLONOSCOPY WITH PROPOFOL;  Surgeon: Milus Banister, MD;  Location: [facility];  Service: Endoscopy;  Laterality: N/A;  . ESOPHAGOGASTRODUODENOSCOPY  12/03/2011   Procedure: ESOPHAGOGASTRODUODENOSCOPY (EGD);  Surgeon: Beryle Beams, MD;  Location: [facility];  Service: Endoscopy;  Laterality: N/A;  . FLEXIBLE SIGMOIDOSCOPY N/A 03/10/2017   Procedure: FLEXIBLE SIGMOIDOSCOPY;  Surgeon: Carol Ada, MD;  Location: [facility];  Service: Endoscopy;  Laterality: N/A;  . INCISION AND DRAINAGE PERIRECTAL ABSCESS N/A 03/01/2017   Procedure: IRRIGATION AND DEBRIDEMENT PERIRECTAL ABSCESS;  Surgeon: Alphonsa Overall, MD;  Location: [facility];  Service: General;  Laterality: N/A;  . IRRIGATION AND DEBRIDEMENT BUTTOCKS N/A 03/23/2017   Procedure: IRRIGATION AND DEBRIDEMENT BUTTOCKS, SETON PLACEMENT;  Surgeon: [name], MD;  Location: [facility];  Service: General;  Laterality: N/A;  . LAPAROSCOPY  11/23/2011   Procedure: LAPAROSCOPY DIAGNOSTIC;  Surgeon: Edward Jolly, MD;  Location: [facility];  Service: General;  Laterality: N/A;  . SIGMOIDOSCOPY    . UPPER GASTROINTESTINAL ENDOSCOPY    . WISDOM TOOTH EXTRACTION       reports that she has never smoked. She has never used smokeless tobacco. She reports current drug use. Drug: Marijuana. She reports that she does not drink alcohol.  Allergies  Allergen  Reactions  . Other Anaphylaxis    Reaction to [place] nuts   . Lactose Intolerance (Gi) Diarrhea    Family History  Problem Relation Age of Onset  . Diabetes Mother   . Hypertension Father   . Colon cancer Paternal Grandmother        pt thinks PGM was dx in her 73's  . Diabetes Paternal Grandmother   . Diabetes Maternal  Grandmother   . Diabetes Maternal Grandfather   . Diabetes Paternal Grandfather   . Diabetes Other   . Esophageal cancer Neg Hx   . Liver cancer Neg Hx   . Pancreatic cancer Neg Hx   . Stomach cancer Neg Hx   . Rectal cancer Neg Hx    Prior to Admission medications   Medication Sig Start Date End Date Taking? Authorizing Provider  cyclobenzaprine (FLEXERIL) 10 MG tablet TAKE 1 TABLET(10 MG) BY MOUTH THREE TIMES DAILY AS NEEDED FOR MUSCLE SPASMS Patient taking differently: Take 10 mg by mouth 3 (three) times daily as needed for muscle spasms.  04/29/19  Yes [name], MD  dicyclomine (BENTYL) 20 MG tablet Take 1 tablet (20 mg total) by mouth 3 (three) times daily as needed for spasms. 05/01/19  Yes [name], MD  furosemide (LASIX) 40 MG tablet TAKE 1 TABLET BY MOUTH  DAILY Patient taking differently: Take 40 mg by mouth daily.  11/11/18  Yes Armbruster, Carlota Raspberry, MD  hydrALAZINE (APRESOLINE) 50 MG tablet Take 1 tablet (50 mg total) by mouth every 6 (six) hours. 10/23/18  Yes Swayze, Ava, DO  Insulin Glargine (LANTUS SOLOSTAR) 100 UNIT/ML Solostar Pen Inject 10 Units into the skin at bedtime. And pen needles 4/day Patient taking differently: Inject 12 Units into the skin at bedtime. And pen needles 4/day 12/31/18  Yes Renato Shin, MD  insulin lispro (HUMALOG KWIKPEN) 100 UNIT/ML KwikPen Inject 0.06 mLs (6 Units total) into the skin 3 (three) times daily. Patient taking differently: Inject 5 Units into the skin 3 (three) times daily.  12/31/18  Yes Renato Shin, MD  loperamide (IMODIUM) 2 MG capsule Take 6 mg by mouth as needed for diarrhea or loose stools.   Yes [provider]  LORazepam (ATIVAN) 0.5 MG tablet Take 1 tablet (0.5 mg total) by mouth 2 (two) times daily as needed for anxiety. 04/05/19  Yes [name], MD  metoprolol tartrate (LOPRESSOR) 25 MG tablet Take 1 tablet (25 mg total) by mouth 2 (two) times daily. 10/23/18  Yes Swayze, Ava, DO  mirtazapine  (REMERON) 30 MG tablet TAKE 1 TABLET BY MOUTH AT  BEDTIME Patient taking differently: Take 30 mg by mouth at bedtime.  04/19/19  Yes [name], MD  OVER THE COUNTER MEDICATION Take 2 tablets by mouth daily. 2 chewable gummies called Flo for period   Yes [provider]  pantoprazole (PROTONIX) 40 MG tablet TAKE 1 TABLET BY MOUTH  DAILY Patient taking differently: Take 40 mg by mouth daily.  10/22/18  Yes Armbruster, Carlota Raspberry, MD  promethazine (PHENERGAN) 12.5 MG tablet Take 1 tablet (12.5 mg total) by mouth every 8 (eight) hours as needed for nausea or vomiting. Please schedule an Office Visit for further refills. 05/02/19  Yes Armbruster, Carlota Raspberry, MD  PROMETHEGAN 12.5 MG suppository Place 12.5 mg rectally every 8 (eight) hours as needed for nausea or vomiting.  03/14/19  Yes [provider]  Continuous Blood Gluc Receiver ([place]) [place] See admin instructions. USE AS DIRECTED TO MONITOR  GLUCOSE LEVELS 10/29/18   [provider]  Continuous Blood Gluc Sensor (DEXCOM G6 SENSOR) MISC 1 each by Does not apply route See admin instructions. every 10 days. 12/31/18   Renato Shin, MD  Continuous Blood Gluc Transmit (DEXCOM G6 TRANSMITTER) MISC See admin instructions. USE AS DIRECTED TO MONITOR GLUCOSE LEVELS 10/29/18   [provider]  promethazine (PHENERGAN) 25 MG tablet Take 1 tablet (25 mg total) by mouth every 6 (six) hours as needed for nausea or vomiting. Patient not taking: Reported on 05/21/2019 03/14/19   Drenda Freeze, MD  traMADol (ULTRAM) 50 MG tablet TAKE 1 TABLET(50 MG) BY MOUTH EVERY 6 HOURS AS NEEDED FOR MODERATE PAIN OR SEVERE PAIN Patient not taking: Reported on 05/21/2019 04/28/19   [name], MD  metoCLOPramide (REGLAN) 10 MG tablet Take 1 tablet (10 mg total) by mouth every 6 (six) hours as needed for nausea or vomiting. Patient not taking: Reported on 04/23/2019 02/20/18 04/23/19  Shelly Coss, MD  potassium chloride 20 MEQ  TBCR Take 20 mEq by mouth 2 (two) times daily. Patient not taking: Reported on 03/31/2019 10/17/18 04/23/19  Isla Pence, MD  potassium chloride SA (KLOR-CON) 20 MEQ tablet Take 1 tablet (20 mEq total) by mouth daily. Patient not taking: Reported on 03/31/2019 03/14/19 04/23/19  Drenda Freeze, MD    Physical Exam:  Physical Exam Constitutional:      General: She is not in acute distress.    Appearance: She is well-developed. She is not ill-appearing, toxic-appearing or diaphoretic.  Eyes:     Conjunctiva/sclera: Conjunctivae normal.     Pupils: Pupils are equal, round, and reactive to light.  Cardiovascular:     Rate and Rhythm: Normal rate and regular rhythm.     Heart sounds: Normal heart sounds. No murmur.  Pulmonary:     Effort: Pulmonary effort is normal. No respiratory distress.     Breath sounds: Normal breath sounds. No wheezing or rales.  Abdominal:     General: Bowel sounds are normal. There is no distension.     Palpations: Abdomen is soft.     Tenderness: There is no abdominal tenderness. There is no guarding or rebound.  Musculoskeletal:        General: No tenderness. Normal range of motion.     Cervical back: Normal range of motion.  Lymphadenopathy:     Cervical: No cervical adenopathy.  Skin:    General: Skin is warm and dry.     Comments: Two ulcerations on right shin without surrounding erythema  Neurological:     Mental Status: She is alert and oriented to person, place, and time.  Psychiatric:        Behavior: Behavior is cooperative.     Labs on Admission: I have personally reviewed following labs and imaging studies  CBC: Recent Labs  Lab 05/21/19 0526  WBC 4.5  NEUTROABS 2.6  HGB 12.1  HCT 37.2  MCV 83.4  PLT 497    Basic Metabolic Panel: Recent Labs  Lab 05/21/19 0526  NA 135  K 4.1  CL 94*  CO2 26  GLUCOSE 395*  BUN 24*  CREATININE 1.09*  CALCIUM 9.7    GFR: Estimated Creatinine Clearance: 74.1 mL/min (A) (by C-G  formula based on SCr of 1.09 mg/dL (H)).  Liver Function Tests: Recent Labs  Lab 05/21/19 0526  AST 25  ALT 56*  ALKPHOS 103  BILITOT 0.7  PROT 8.7*  ALBUMIN 3.9   Recent Labs  Lab  05/21/19 0526  LIPASE 22   No results for input(s): AMMONIA in the last 168 hours.  Coagulation Profile: No results for input(s): INR, PROTIME in the last 168 hours.  Cardiac Enzymes: No results for input(s): CKTOTAL, CKMB, CKMBINDEX, TROPONINI in the last 168 hours.  BNP (last 3 results) No results for input(s): PROBNP in the last 8760 hours.  HbA1C: No results for input(s): HGBA1C in the last 72 hours.  CBG: Recent Labs  Lab 05/21/19 0613  GLUCAP 350*    Lipid Profile: No results for input(s): CHOL, HDL, LDLCALC, TRIG, CHOLHDL, LDLDIRECT in the last 72 hours.  Thyroid Function Tests: No results for input(s): TSH, T4TOTAL, FREET4, T3FREE, THYROIDAB in the last 72 hours.  Anemia Panel: No results for input(s): VITAMINB12, FOLATE, FERRITIN, TIBC, IRON, RETICCTPCT in the last 72 hours.  Urine analysis:    Component Value Date/Time   COLORURINE YELLOW 04/30/2019 0258   APPEARANCEUR CLEAR 04/30/2019 0258   LABSPEC 1.017 04/30/2019 0258   PHURINE 7.0 04/30/2019 0258   GLUCOSEU >=500 (A) 04/30/2019 0258   HGBUR SMALL (A) 04/30/2019 0258   BILIRUBINUR NEGATIVE 04/30/2019 0258   BILIRUBINUR negative 01/02/2015 1717   KETONESUR 5 (A) 04/30/2019 0258   PROTEINUR >=300 (A) 04/30/2019 0258   UROBILINOGEN 0.2 01/13/2015 0223   NITRITE NEGATIVE 04/30/2019 0258   LEUKOCYTESUR NEGATIVE 04/30/2019 0258     Radiological Exams on Admission: No results found.  Assessment/Plan Active Problems:   Intractable nausea and vomiting   Intractable nausea and vomiting With a history of gastroparesis in setting of diabetes mellitus.  She also is a chronic marijuana user.  She currently follows with [facility] but has had no improvement in her symptoms.  Urinalysis and UDS are  pending. -Continue Phenergan IV as needed; Zofran as needed if Phenergan is not improving symptoms -Start Reglan IV -N.p.o. until nausea improved  Diabetes mellitus, type I Patient is on Lantus 12 units nightly in addition to Humalog 5 units 3 times daily with meals.  Patient states that she did not take her Lantus last night secondary to nausea vomiting and history of hypoglycemia. -Lantus 10 units nightly -SSI every 4 hours while n.p.o.  Tachycardia Possibly secondary to dehydration but could be rebound secondary to patient being nonadherent with her medications in setting of nausea and vomiting.  Essential hypertension Hypertensive urgency Patient is on hydralazine 50 mg every 6 hours in addition to metoprolol 25 mg twice daily.  Patient with hypertensive urgency in the emergency department which has improved with labetalol IV. -Metoprolol 5 mg IV QID while NPO  GERD Patient is on Protonix as an outpatient.  Will hold while NPO.  Depression Anxiety Patient is on Ativan and Remeron as an outpatient.  Will hold while NPO.  Skin lesions Not infected. No management at this time.   DVT prophylaxis: Lovenox Code Status: Full code Family Communication: None Disposition Plan: Medical floor Consults called: None Admission status: Observation   Cordelia Poche, MD [facility] 05/21/2019, 12:05 PM

## 2019-05-22 DIAGNOSIS — I160 Hypertensive urgency: Secondary | ICD-10-CM

## 2019-05-22 DIAGNOSIS — R112 Nausea with vomiting, unspecified: Secondary | ICD-10-CM | POA: Diagnosis not present

## 2019-05-22 DIAGNOSIS — E1010 Type 1 diabetes mellitus with ketoacidosis without coma: Secondary | ICD-10-CM

## 2019-05-22 DIAGNOSIS — E1069 Type 1 diabetes mellitus with other specified complication: Secondary | ICD-10-CM | POA: Diagnosis not present

## 2019-05-22 DIAGNOSIS — K3184 Gastroparesis: Secondary | ICD-10-CM

## 2019-05-22 DIAGNOSIS — E1043 Type 1 diabetes mellitus with diabetic autonomic (poly)neuropathy: Secondary | ICD-10-CM

## 2019-05-22 DIAGNOSIS — K2100 Gastro-esophageal reflux disease with esophagitis, without bleeding: Secondary | ICD-10-CM | POA: Diagnosis not present

## 2019-05-22 LAB — BASIC METABOLIC PANEL
Anion gap: 5 (ref 5–15)
BUN: 14 mg/dL (ref 6–20)
CO2: 27 mmol/L (ref 22–32)
Calcium: 8.2 mg/dL — ABNORMAL LOW (ref 8.9–10.3)
Chloride: 103 mmol/L (ref 98–111)
Creatinine, Ser: 0.69 mg/dL (ref 0.44–1.00)
GFR calc Af Amer: >60 mL/min (ref >60)
GFR calc non Af Amer: >60 mL/min (ref >60)
Glucose, Bld: 64 mg/dL — ABNORMAL LOW (ref 70–99)
Potassium: 3.7 mmol/L (ref 3.5–5.1)
Sodium: 135 mmol/L (ref 135–145)

## 2019-05-22 LAB — CBC
HCT: 28 % — ABNORMAL LOW (ref 36.0–46.0)
Hemoglobin: 8.9 g/dL — ABNORMAL LOW (ref 12.0–15.0)
MCH: 27.4 pg (ref 26.0–34.0)
MCHC: 31.8 g/dL (ref 30.0–36.0)
MCV: 86.2 fL (ref 80.0–100.0)
Platelets: 176 10*3/uL (ref 150–400)
RBC: 3.25 MIL/uL — ABNORMAL LOW (ref 3.87–5.11)
RDW: 12.8 % (ref 11.5–15.5)
WBC: 3.7 10*3/uL — ABNORMAL LOW (ref 4.0–10.5)
nRBC: 0 % (ref 0.0–0.2)

## 2019-05-22 LAB — GLUCOSE, CAPILLARY
Glucose-Capillary: 117 mg/dL — ABNORMAL HIGH (ref 70–99)
Glucose-Capillary: 64 mg/dL — ABNORMAL LOW (ref 70–99)
Glucose-Capillary: 47 mg/dL — ABNORMAL LOW (ref 70–99)
Glucose-Capillary: 63 mg/dL — ABNORMAL LOW (ref 70–99)
Glucose-Capillary: 118 mg/dL — ABNORMAL HIGH (ref 70–99)
Glucose-Capillary: 124 mg/dL — ABNORMAL HIGH (ref 70–99)

## 2019-05-22 MED ORDER — METOPROLOL TARTRATE 25 MG PO TABS: 25.0000 mg | ORAL_TABLET | Freq: Two times a day (BID) | ORAL | 0 refills | Status: DC

## 2019-05-22 MED ORDER — ONDANSETRON HCL 4 MG PO TABS: 4.0000 mg | ORAL_TABLET | Freq: Three times a day (TID) | ORAL | Status: DC | PRN

## 2019-05-22 MED ORDER — ONDANSETRON HCL 4 MG PO TABS: 4.0000 mg | ORAL_TABLET | Freq: Three times a day (TID) | ORAL | 0 refills | Status: DC | PRN

## 2019-05-22 MED ORDER — DEXTROSE 50 % IV SOLN
INTRAVENOUS | Status: AC
Start: 2019-05-22 — End: 2019-05-22
  Filled 2019-05-22: qty 50

## 2019-05-22 MED ORDER — DEXTROSE 50 % IV SOLN
12.5000 g | Freq: Once | INTRAVENOUS | Status: AC
  Administered 2019-05-22: 12.5 g via INTRAVENOUS

## 2019-05-22 NOTE — Progress Notes (Signed)
Hypoglycemic event  CBG 64. No symptoms. Patient taking POs. Treatment: 2 icecream. F/u CBG 47. Treatment 8oz OJ. F/u CBG 15 min later CBG 63. Notified NP B[place]. Adjustment made per cbg protocol gave 12.5g D50 IV. Will recheck cbg

## 2019-05-22 NOTE — Progress Notes (Signed)
[name] transferred to room 1427 via WC. Personal belongings were packed by patient and on her lap and went with the patient. Discussion and  plan of care were review and  provided to [name]. Mrs, [name] informed this nurse that she had already notified her husband and family of the transfer.

## 2019-05-22 NOTE — Discharge Summary (Signed)
Physician Discharge Summary  [name] RSW:[id number] DOB: 1990-11-24 DOA: 05/21/2019  PCP: [name], MD  Admit date: 05/21/2019 Discharge date: 05/22/2019  Admitted From: Home  Disposition:  Home   Recommendations for Outpatient Follow-up and new medication changes:  1. Follow up with Dr. Jerline Pain in 2 weeks.  2. Continue quarantine for 2 weeks, maintain physical distancing and use a mask in public.  3. Added Zofran as needed for nausea    Home Health: no  Equipment/Devices: no    Discharge Condition: stable CODE STATUS: full  Diet recommendation: heart healthy and diabetic prudent.   Brief/Interim Summary: Patient was admitted with the working diagnoses of intractable nausea and vomiting, in the setting of SARS COVID 19 viral infection.   29 year old who presented with intractable nausea and vomiting.  She does have significant past medical history for anxiety, depression, type 1 diabetic mellitus, gastroparesis, GERD and hypertension.  She reports several days of nausea and vomiting, refractive to antiemetics, associated with abdominal pain and diarrhea.  Intermittent episodes of dyspnea but no fever or chills.  She has been using marijuana to relieve gastrointestinal symptoms.  On her initial physical examination her temperature is 97.7, heart rate 114, respiratory rate 18-20, blood pressure 167/95, oxygen saturation 96% on room air.  Her lungs were clear to auscultation bilaterally, heart S1-S2 present, abdomen soft nontender, no lower extremity edema.  Sodium 135, potassium four 4.1, chloride 94, bicarb 26, glucose 395 bicarb 26, glucose 395, BUN 24, creatinine 1.0, anion gap of 15, white count 4.5, hemoglobin 12.1, hematocrit 37.2, platelets 224.  SARS COVID-19 positive.  Urinalysis more than 500 glucose, 100 protein, specific gravity 1.017, red cells 21-50 white cells.  Patient received supportive medical therapy, IV fluids and antiemetics, along with sq insulin with  improvement of her symptoms.   1.  Intractable nausea and vomiting.  Patient was admitted to the medical ward, she received intravenous fluids and antiemetics with improvement of her gastrointestinal symptoms.  At the time of discharge patient is tolerating regular diet adequately.  Patient will continue taking as needed Zofran at home.  2.  Uncontrolled type 1 diabetes mellitus, hemoglobin K9F 7.8, complicated by diabetes ketoacidosis.  Patient had subcutaneous insulin, basal and sliding scale, close monitoring of her capillary glucose and acid-base balance.  Received intravenous fluids with isotonic saline.  Her discharge anion gap is 5, fasting glucose 64, bicarb 27, potassium 3.7 and sodium 135.  Patient now tolerating p.o. diet adequately, continue home insulin regimen.  3.  SARS COVID-19 infection.  No clinical signs of respiratory involvement, patient has been instructed to quarantine at home for 2 more weeks, be vigilant about her symptoms, if dyspnea occurs she was advised to return to the hospital.  Currently patient is afebrile and normal oximetry on room air.  4.  Hypertension.  Patient will resume her antihypertensive agents at discharge, metoprolol, hydralazine and furosemide.   5.  GERD.  Continue pantoprazole.  6.  Anxiety/depression.  Continue her psychotropics.   Discharge Diagnoses:  Active Problems:   Chronic abdominal pain   Diabetes mellitus type 1 (HCC)   GERD (gastroesophageal reflux disease)   Anxiety   Diabetic gastroparesis associated with type 1 diabetes mellitus (HCC)   Hypertensive urgency   Intractable nausea and vomiting    Discharge Instructions   Allergies as of 05/22/2019      Reactions   Other Anaphylaxis   Reaction to [place] nuts   Lactose Intolerance (gi) Diarrhea  Medication List    STOP taking these medications   promethazine 12.5 MG tablet Commonly known as: PHENERGAN   promethazine 25 MG tablet Commonly known as: PHENERGAN    Promethegan 12.5 MG suppository Generic drug: promethazine   traMADol 50 MG tablet Commonly known as: ULTRAM     TAKE these medications   cyclobenzaprine 10 MG tablet Commonly known as: FLEXERIL TAKE 1 TABLET(10 MG) BY MOUTH THREE TIMES DAILY AS NEEDED FOR MUSCLE SPASMS What changed: See the new instructions.   Dexcom G6 Receiver Kerrin Mo See admin instructions. USE AS DIRECTED TO MONITOR GLUCOSE LEVELS   Dexcom G6 Sensor Misc 1 each by Does not apply route See admin instructions. every 10 days.   Dexcom G6 Transmitter Misc See admin instructions. USE AS DIRECTED TO MONITOR GLUCOSE LEVELS   dicyclomine 20 MG tablet Commonly known as: BENTYL Take 1 tablet (20 mg total) by mouth 3 (three) times daily as needed for spasms.   furosemide 40 MG tablet Commonly known as: LASIX TAKE 1 TABLET BY MOUTH  DAILY   hydrALAZINE 50 MG tablet Commonly known as: APRESOLINE Take 1 tablet (50 mg total) by mouth every 6 (six) hours.   insulin lispro 100 UNIT/ML KwikPen Commonly known as: HumaLOG KwikPen Inject 0.06 mLs (6 Units total) into the skin 3 (three) times daily. What changed: how much to take   Lantus SoloStar 100 UNIT/ML Solostar Pen Generic drug: Insulin Glargine Inject 10 Units into the skin at bedtime. And pen needles 4/day What changed: how much to take   loperamide 2 MG capsule Commonly known as: IMODIUM Take 6 mg by mouth as needed for diarrhea or loose stools.   LORazepam 0.5 MG tablet Commonly known as: ATIVAN Take 1 tablet (0.5 mg total) by mouth 2 (two) times daily as needed for anxiety.   metoprolol tartrate 25 MG tablet Commonly known as: LOPRESSOR Take 1 tablet (25 mg total) by mouth 2 (two) times daily.   mirtazapine 30 MG tablet Commonly known as: REMERON TAKE 1 TABLET BY MOUTH AT  BEDTIME   ondansetron 4 MG tablet Commonly known as: ZOFRAN Take 1 tablet (4 mg total) by mouth every 8 (eight) hours as needed for nausea or vomiting.   OVER THE COUNTER  MEDICATION Take 2 tablets by mouth daily. 2 chewable gummies called Flo for period   pantoprazole 40 MG tablet Commonly known as: PROTONIX TAKE 1 TABLET BY MOUTH  DAILY       Allergies  Allergen Reactions  . Other Anaphylaxis    Reaction to [place] nuts   . Lactose Intolerance (Gi) Diarrhea    Consultations:     Procedures/Studies:  No results found.   Procedures:   Subjective: Patient is feeling well, no dyspnea or chest pain, no further nausea or vomiting,out of bed and ambulating well.   Discharge Exam: Vitals:   05/22/19 0123 05/22/19 0442  BP: 121/83 (!) 144/102  Pulse: 90 93  Resp: 18 13  Temp: 98.2 F (36.8 C) 98.5 F (36.9 C)  SpO2: 99% 100%   Vitals:   05/21/19 2210 05/22/19 0113 05/22/19 0123 05/22/19 0442  BP: (!) 155/95  121/83 (!) 144/102  Pulse: 94 84 90 93  Resp: 18 13 18 13   Temp: 98 F (36.7 C)  98.2 F (36.8 C) 98.5 F (36.9 C)  TempSrc: Oral  Oral Oral  SpO2: 100% 100% 99% 100%  Weight:      Height:        General: Not in pain  or dyspnea.  Neurology: Awake and alert, non focal  E ENT: no pallor, no icterus, oral mucosa moist Cardiovascular: No JVD. S1-S2 present, rhythmic, no gallops, rubs, or murmurs. No lower extremity edema. Pulmonary: vesicular breath sounds bilaterally, adequate air movement, no wheezing, rhonchi or rales. Gastrointestinal. Abdomen with, no organomegaly, non tender, no rebound or guarding Skin. No rashes Musculoskeletal: no joint deformities   The results of significant diagnostics from this hospitalization (including imaging, microbiology, ancillary and laboratory) are listed below for reference.     Microbiology: Recent Results (from the past 240 hour(s))  SARS CORONAVIRUS 2 (TAT 6-24 HRS) Nasopharyngeal Nasopharyngeal Swab     Status: Abnormal   Collection Time: 05/21/19 11:23 AM   Specimen: Nasopharyngeal Swab  Result Value Ref Range Status   SARS Coronavirus 2 POSITIVE (A) NEGATIVE Final     Comment: RESULT CALLED TO, READ BACK BY AND VERIFIED WITH: RN ALIN E ROGERS AT 2246 BY MESSAN HOUEGNIFIO ON 05/21/2019 (NOTE) SARS-CoV-2 target nucleic acids are DETECTED. The SARS-CoV-2 RNA is generally detectable in upper and lower respiratory specimens during the acute phase of infection. Positive results are indicative of the presence of SARS-CoV-2 RNA. Clinical correlation with patient history and other diagnostic information is  necessary to determine patient infection status. Positive results do not rule out bacterial infection or co-infection with other viruses.  The expected result is Negative. Fact Sheet for Patients: SugarRoll.be Fact Sheet for Healthcare Providers: https://www.woods-mathews.com/ This test is not yet approved or cleared by the [place] FDA and  has been authorized for detection and/or diagnosis of SARS-CoV-2 by FDA under an Emergency Use Authorization (EUA). This EUA will remain  in effect (meaning this te st can be used) for the duration of the COVID-19 declaration under Section 564(b)(1) of the Act, 21 U.S.C. section 360bbb-3(b)(1), unless the authorization is terminated or revoked sooner. Performed at [facility], [place] [address] [place], [place] [address]      Labs: BNP (last 3 results) No results for input(s): BNP in the last 8760 hours. Basic Metabolic Panel: Recent Labs  Lab 05/21/19 0526 05/22/19 0353  NA 135 135  K 4.1 3.7  CL 94* 103  CO2 26 27  GLUCOSE 395* 64*  BUN 24* 14  CREATININE 1.09* 0.69  CALCIUM 9.7 8.2*   Liver Function Tests: Recent Labs  Lab 05/21/19 0526  AST 25  ALT 56*  ALKPHOS 103  BILITOT 0.7  PROT 8.7*  ALBUMIN 3.9   Recent Labs  Lab 05/21/19 0526  LIPASE 22   No results for input(s): AMMONIA in the last 168 hours. CBC: Recent Labs  Lab 05/21/19 0526 05/22/19 0353  WBC 4.5 3.7*  NEUTROABS 2.6  --   HGB 12.1 8.9*  HCT 37.2 28.0*  MCV 83.4  86.2  PLT 224 176   Cardiac Enzymes: No results for input(s): CKTOTAL, CKMB, CKMBINDEX, TROPONINI in the last 168 hours. BNP: Invalid input(s): POCBNP CBG: Recent Labs  Lab 05/22/19 0105 05/22/19 0427 05/22/19 0535 05/22/19 0612 05/22/19 0822  GLUCAP 117* 64* 47* 63* 118*   D-Dimer No results for input(s): DDIMER in the last 72 hours. Hgb A1c No results for input(s): HGBA1C in the last 72 hours. Lipid Profile No results for input(s): CHOL, HDL, LDLCALC, TRIG, CHOLHDL, LDLDIRECT in the last 72 hours. Thyroid function studies No results for input(s): TSH, T4TOTAL, T3FREE, THYROIDAB in the last 72 hours.  Invalid input(s): FREET3 Anemia work up No results for input(s): VITAMINB12, FOLATE, FERRITIN, TIBC, IRON,  RETICCTPCT in the last 72 hours. Urinalysis    Component Value Date/Time   COLORURINE YELLOW 05/21/2019 [place] 05/21/2019 1207   LABSPEC 1.017 05/21/2019 1207   PHURINE 6.0 05/21/2019 1207   GLUCOSEU >=500 (A) 05/21/2019 1207   HGBUR SMALL (A) 05/21/2019 1207   [place] 05/21/2019 1207   BILIRUBINUR negative 01/02/2015 1717   KETONESUR 20 (A) 05/21/2019 1207   PROTEINUR 100 (A) 05/21/2019 1207   UROBILINOGEN 0.2 01/13/2015 0223   NITRITE NEGATIVE 05/21/2019 1207   LEUKOCYTESUR NEGATIVE 05/21/2019 1207   Sepsis Labs Invalid input(s): PROCALCITONIN,  WBC,  LACTICIDVEN Microbiology Recent Results (from the past 240 hour(s))  SARS CORONAVIRUS 2 (TAT 6-24 HRS) Nasopharyngeal Nasopharyngeal Swab     Status: Abnormal   Collection Time: 05/21/19 11:23 AM   Specimen: Nasopharyngeal Swab  Result Value Ref Range Status   SARS Coronavirus 2 POSITIVE (A) NEGATIVE Final    Comment: RESULT CALLED TO, READ BACK BY AND VERIFIED WITH: RN ALIN E ROGERS AT 2246 BY MESSAN HOUEGNIFIO ON 05/21/2019 (NOTE) SARS-CoV-2 target nucleic acids are DETECTED. The SARS-CoV-2 RNA is generally detectable in upper and lower respiratory specimens during the  acute phase of infection. Positive results are indicative of the presence of SARS-CoV-2 RNA. Clinical correlation with patient history and other diagnostic information is  necessary to determine patient infection status. Positive results do not rule out bacterial infection or co-infection with other viruses.  The expected result is Negative. Fact Sheet for Patients: SugarRoll.be Fact Sheet for Healthcare Providers: https://www.woods-mathews.com/ This test is not yet approved or cleared by the [place] FDA and  has been authorized for detection and/or diagnosis of SARS-CoV-2 by FDA under an Emergency Use Authorization (EUA). This EUA will remain  in effect (meaning this te st can be used) for the duration of the COVID-19 declaration under Section 564(b)(1) of the Act, 21 U.S.C. section 360bbb-3(b)(1), unless the authorization is terminated or revoked sooner. Performed at [facility], [place] [address] [place], [place] [address]      Time coordinating discharge: 45 minutes  SIGNED:   Tawni Millers, MD  [facility] 05/22/2019, 11:48 AM

## 2019-05-24 ENCOUNTER — Telehealth: Payer: Self-pay

## 2019-05-24 NOTE — Telephone Encounter (Signed)
Called patient to schedule 2 week hospital follow up.  Voicemail full and not accepting messages.  Will try again.

## 2019-05-25 ENCOUNTER — Other Ambulatory Visit: Payer: Self-pay

## 2019-05-25 ENCOUNTER — Encounter (HOSPITAL_COMMUNITY): Payer: Self-pay

## 2019-05-25 ENCOUNTER — Emergency Department (HOSPITAL_COMMUNITY)
Admission: EM | Admit: 2019-05-25 | Discharge: 2019-05-26 | Disposition: A | Discharge status: Home / Self Care | Payer: 59 | Attending: Emergency Medicine | Admitting: Emergency Medicine

## 2019-05-25 ENCOUNTER — Emergency Department (HOSPITAL_COMMUNITY): Payer: 59

## 2019-05-25 DIAGNOSIS — R112 Nausea with vomiting, unspecified: Secondary | ICD-10-CM | POA: Diagnosis not present

## 2019-05-25 DIAGNOSIS — R109 Unspecified abdominal pain: Secondary | ICD-10-CM | POA: Diagnosis not present

## 2019-05-25 DIAGNOSIS — R0789 Other chest pain: Secondary | ICD-10-CM | POA: Insufficient documentation

## 2019-05-25 DIAGNOSIS — Z79899 Other long term (current) drug therapy: Secondary | ICD-10-CM | POA: Insufficient documentation

## 2019-05-25 DIAGNOSIS — E109 Type 1 diabetes mellitus without complications: Secondary | ICD-10-CM | POA: Insufficient documentation

## 2019-05-25 DIAGNOSIS — U071 COVID-19: Secondary | ICD-10-CM | POA: Diagnosis not present

## 2019-05-25 DIAGNOSIS — R0602 Shortness of breath: Secondary | ICD-10-CM | POA: Diagnosis present

## 2019-05-25 LAB — COMPREHENSIVE METABOLIC PANEL
ALT: 33 U/L (ref 0–44)
AST: 37 U/L (ref 15–41)
Albumin: 3.2 g/dL — ABNORMAL LOW (ref 3.5–5.0)
Alkaline Phosphatase: 75 U/L (ref 38–126)
Anion gap: 14 (ref 5–15)
BUN: 25 mg/dL — ABNORMAL HIGH (ref 6–20)
CO2: 28 mmol/L (ref 22–32)
Calcium: 9 mg/dL (ref 8.9–10.3)
Chloride: 96 mmol/L — ABNORMAL LOW (ref 98–111)
Creatinine, Ser: 1.08 mg/dL — ABNORMAL HIGH (ref 0.44–1.00)
GFR calc Af Amer: >60 mL/min (ref >60)
GFR calc non Af Amer: >60 mL/min (ref >60)
Glucose, Bld: 447 mg/dL — ABNORMAL HIGH (ref 70–99)
Potassium: 3.9 mmol/L (ref 3.5–5.1)
Sodium: 138 mmol/L (ref 135–145)
Total Bilirubin: 0.7 mg/dL (ref 0.3–1.2)
Total Protein: 7.4 g/dL (ref 6.5–8.1)

## 2019-05-25 LAB — CBC WITH DIFFERENTIAL/PLATELET
Abs Immature Granulocytes: 0.01 10*3/uL (ref 0.00–0.07)
Basophils Absolute: 0 10*3/uL (ref 0.0–0.1)
Basophils Relative: 0 %
Eosinophils Absolute: 0.1 10*3/uL (ref 0.0–0.5)
Eosinophils Relative: 2 %
HCT: 32.7 % — ABNORMAL LOW (ref 36.0–46.0)
Hemoglobin: 10.3 g/dL — ABNORMAL LOW (ref 12.0–15.0)
Immature Granulocytes: 0 %
Lymphocytes Relative: 30 %
Lymphs Abs: 1.2 10*3/uL (ref 0.7–4.0)
MCH: 27 pg (ref 26.0–34.0)
MCHC: 31.5 g/dL (ref 30.0–36.0)
MCV: 85.8 fL (ref 80.0–100.0)
Monocytes Absolute: 0.4 10*3/uL (ref 0.1–1.0)
Monocytes Relative: 10 %
Neutro Abs: 2.2 10*3/uL (ref 1.7–7.7)
Neutrophils Relative %: 58 %
Platelets: 168 10*3/uL (ref 150–400)
RBC: 3.81 MIL/uL — ABNORMAL LOW (ref 3.87–5.11)
RDW: 12.9 % (ref 11.5–15.5)
WBC: 3.8 10*3/uL — ABNORMAL LOW (ref 4.0–10.5)
nRBC: 0 % (ref 0.0–0.2)

## 2019-05-25 LAB — LACTIC ACID, PLASMA: Lactic Acid, Venous: 1.2 mmol/L (ref 0.5–1.9)

## 2019-05-25 LAB — I-STAT BETA HCG BLOOD, ED (MC, WL, AP ONLY): I-stat hCG, quantitative: <5 m[IU]/mL (ref <5)

## 2019-05-25 LAB — LACTATE DEHYDROGENASE: LDH: 252 U/L — ABNORMAL HIGH (ref 98–192)

## 2019-05-25 LAB — FIBRINOGEN: Fibrinogen: 558 mg/dL — ABNORMAL HIGH (ref 210–475)

## 2019-05-25 IMAGING — DX DG CHEST 1V PORT
1 series · 1 of 1 positions shown · non-contrast
Comparison: Radiograph [DATE]

CLINICAL DATA: Shortness of breath, back pain, diabetic, COVID

EXAM:
PORTABLE CHEST 1 VIEW

[chest ap]
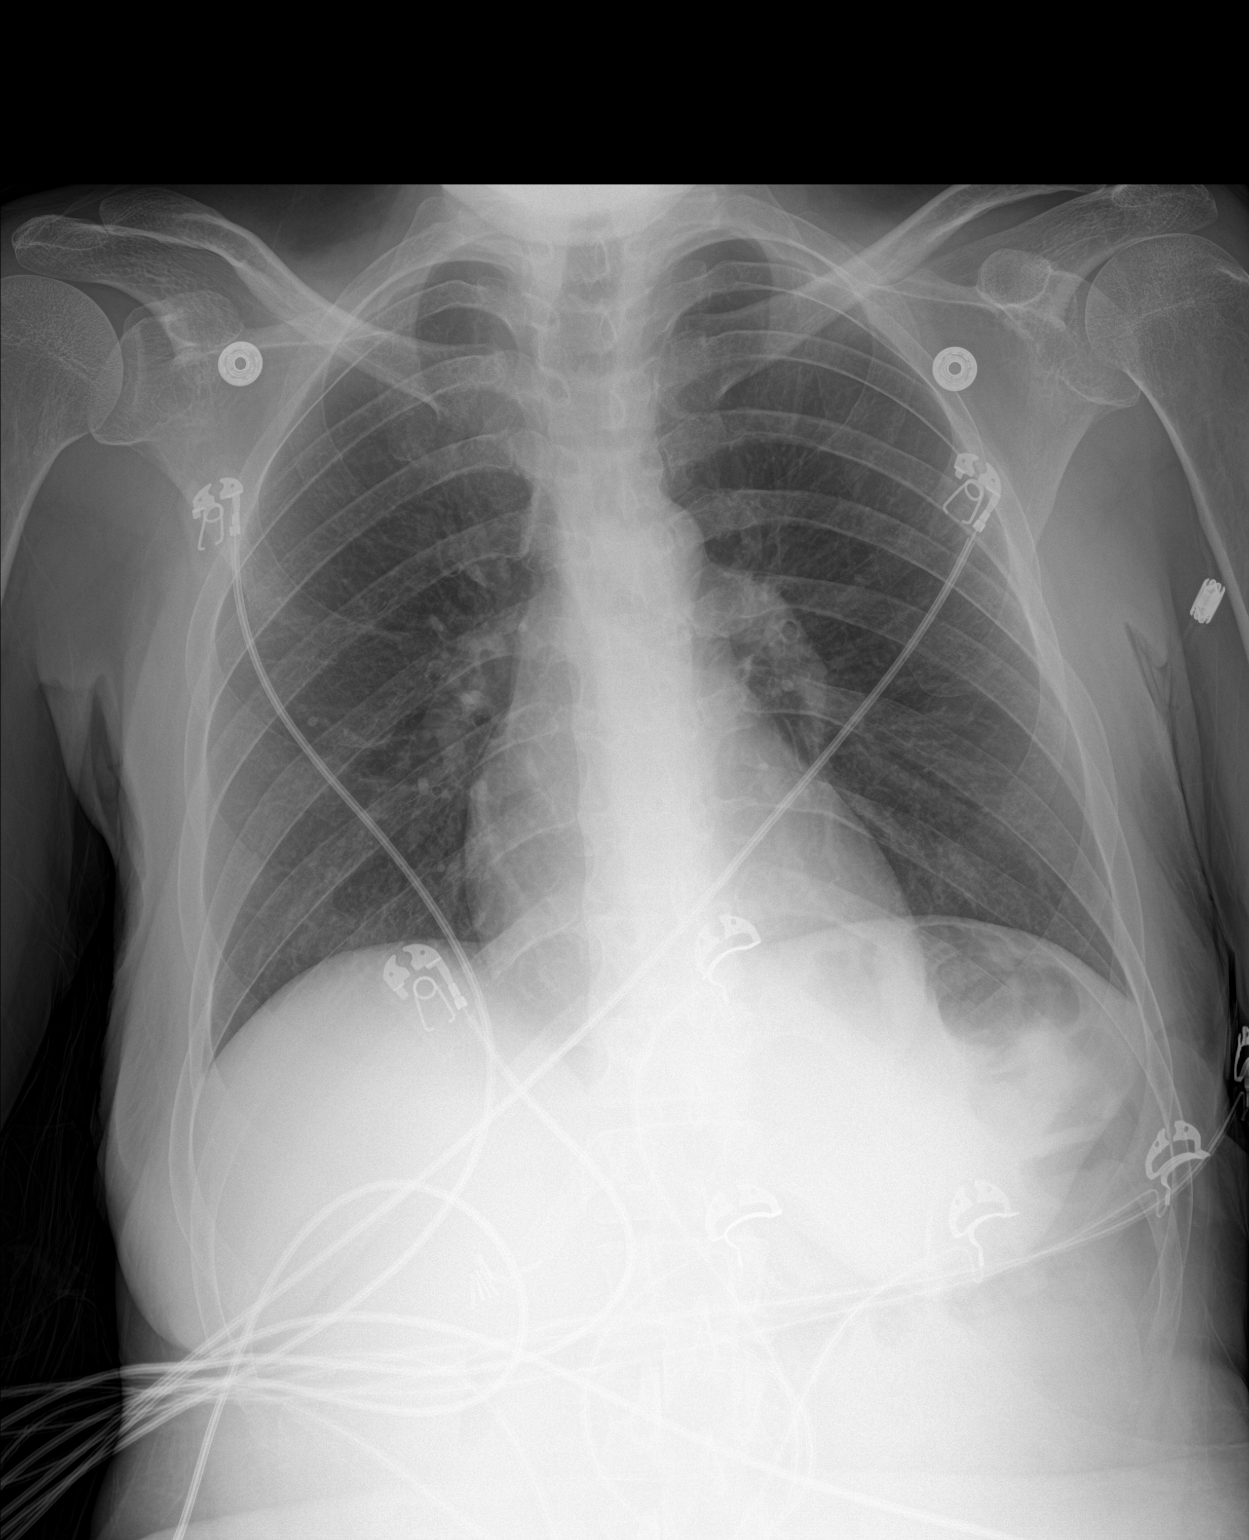

[1 of 1 positions shown; findings below may reference images not displayed]

FINDINGS: No consolidation, features of edema, pneumothorax, or effusion.
Pulmonary vascularity is normally distributed. The cardiomediastinal
contours are unremarkable. No acute osseous or soft tissue
abnormality. Cholecystectomy clips present in the right upper
quadrant.
IMPRESSION: No acute cardiopulmonary abnormality.

## 2019-05-25 MED ORDER — INSULIN ASPART 100 UNIT/ML ~~LOC~~ SOLN
8.0000 [IU] | Freq: Once | SUBCUTANEOUS | Status: AC
  Administered 2019-05-26: 8 [IU] via INTRAVENOUS
  Filled 2019-05-25: qty 0.08

## 2019-05-25 MED ORDER — DROPERIDOL 2.5 MG/ML IJ SOLN
2.5000 mg | Freq: Once | INTRAMUSCULAR | Status: AC
  Administered 2019-05-25: 2.5 mg via INTRAVENOUS
  Filled 2019-05-25: qty 2

## 2019-05-25 MED ORDER — PANTOPRAZOLE SODIUM 40 MG IV SOLR
40.0000 mg | Freq: Once | INTRAVENOUS | Status: AC
  Administered 2019-05-25: 40 mg via INTRAVENOUS
  Filled 2019-05-25: qty 40

## 2019-05-25 MED ORDER — SODIUM CHLORIDE 0.9 % IV BOLUS
1000.0000 mL | Freq: Once | INTRAVENOUS | Status: AC
  Administered 2019-05-25: 1000 mL via INTRAVENOUS

## 2019-05-25 NOTE — ED Provider Notes (Signed)
Emergency Department Provider Note   I have reviewed the triage vital signs and the nursing notes.   HISTORY  Chief Complaint Emesis and Shortness of Breath   HPI [name] is a 29 y.o. female with PMH of uncontrolled DM, gastroparesis, and recent COVID diagnosis presents to the ED with 4 days of vomiting, chest tightness, SOB, and intermittent fever. Patient was discharged 3 days ago with similar symptoms. Reports abdominal cramping but that this is typical of her gastroparesis pain. She tells me that the chest tightness is new. She has tried Zofran at home with no relief.    Past Medical History:  Diagnosis Date  . Allergy   . Anemia   . Anxiety   . Blood transfusion without reported diagnosis    Dec 2018  . Cataract    right eye  . Depression   . Diabetes type 1, uncontrolled ([place]) 11/14/2011   Since age 38  . Fibromyalgia   . Gastroparesis   . GERD (gastroesophageal reflux disease)   . Hypertension   . Infection    UTI April 2016    Patient Active Problem List   Diagnosis Date Noted  . Nausea & vomiting 04/30/2019  . Hypertensive urgency 04/30/2019  . Intractable nausea and vomiting 04/30/2019  . Contraception management 08/06/2018  . Cyclical vomiting syndrome 06/07/2018  . Diabetic gastroparesis associated with type 1 diabetes mellitus ([place]) 06/04/2018  . Anxiety 05/31/2018  . Peripheral edema 05/31/2018  . Normocytic anemia 07/10/2017  . GERD (gastroesophageal reflux disease) 06/23/2017  . 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 Diabetes mellitus type 1 ([place]) 04/28/2013  . Protein-calorie malnutrition, severe ([place]) 04/28/2013  . Hypertension associated with diabetes ([place]) 11/14/2011  . Chronic abdominal pain 09/18/2011    Past Surgical History:  Procedure Laterality Date  . ANKLE SURGERY    . CHOLECYSTECTOMY  11/15/2011   Procedure: LAPAROSCOPIC CHOLECYSTECTOMY WITH INTRAOPERATIVE CHOLANGIOGRAM;  Surgeon:  [name], MD;  Location: [facility];  Service: General;  Laterality: N/A;  . COLONOSCOPY    . COLONOSCOPY WITH PROPOFOL N/A 06/27/2017   Procedure: COLONOSCOPY WITH PROPOFOL;  Surgeon: Milus Banister, MD;  Location: [facility];  Service: Endoscopy;  Laterality: N/A;  . ESOPHAGOGASTRODUODENOSCOPY  12/03/2011   Procedure: ESOPHAGOGASTRODUODENOSCOPY (EGD);  Surgeon: Beryle Beams, MD;  Location: [facility];  Service: Endoscopy;  Laterality: N/A;  . FLEXIBLE SIGMOIDOSCOPY N/A 03/10/2017   Procedure: FLEXIBLE SIGMOIDOSCOPY;  Surgeon: Carol Ada, MD;  Location: [facility];  Service: Endoscopy;  Laterality: N/A;  . INCISION AND DRAINAGE PERIRECTAL ABSCESS N/A 03/01/2017   Procedure: IRRIGATION AND DEBRIDEMENT PERIRECTAL ABSCESS;  Surgeon: Alphonsa Overall, MD;  Location: [facility];  Service: General;  Laterality: N/A;  . IRRIGATION AND DEBRIDEMENT BUTTOCKS N/A 03/23/2017   Procedure: IRRIGATION AND DEBRIDEMENT BUTTOCKS, SETON PLACEMENT;  Surgeon: [name], MD;  Location: [facility];  Service: General;  Laterality: N/A;  . LAPAROSCOPY  11/23/2011   Procedure: LAPAROSCOPY DIAGNOSTIC;  Surgeon: Edward Jolly, MD;  Location: [facility];  Service: General;  Laterality: N/A;  . SIGMOIDOSCOPY    . UPPER GASTROINTESTINAL ENDOSCOPY    . WISDOM TOOTH EXTRACTION      Allergies Other and Lactose intolerance (gi)  Family History  Problem Relation Age of Onset  . Diabetes Mother   . Hypertension Father   . Colon cancer Paternal Grandmother        pt thinks PGM was dx in her 59's  . Diabetes Paternal Grandmother   .  Diabetes Maternal Grandmother   . Diabetes Maternal Grandfather   . Diabetes Paternal Grandfather   . Diabetes Other   . Esophageal cancer Neg Hx   . Liver cancer Neg Hx   . Pancreatic cancer Neg Hx   . Stomach cancer Neg Hx   . Rectal cancer Neg Hx     Social History Social History   Tobacco Use  . Smoking status: Never Smoker  . Smokeless tobacco: Never Used  Substance  Use Topics  . Alcohol use: No    Alcohol/week: 0.0 standard drinks  . Drug use: Yes    Types: Marijuana    Review of Systems  Constitutional: Positive fever (intermittent at home).  Eyes: No visual changes. ENT: No sore throat. Cardiovascular: Positive chest pain. Respiratory: Positive shortness of breath. Gastrointestinal: Positive abdominal pain. Positive nausea and vomiting.  No diarrhea.  No constipation. Genitourinary: Negative for dysuria. Musculoskeletal: Negative for back pain. Skin: Negative for rash. Neurological: Negative for headaches, focal weakness or numbness.  10-point ROS otherwise negative.  ____________________________________________   PHYSICAL EXAM:  VITAL SIGNS: ED Triage Vitals  Enc Vitals Group     BP 05/25/19 2145 139/76     Pulse Rate 05/25/19 2145 96     Resp 05/25/19 2146 17     Temp 05/25/19 2145 98.1 F (36.7 C)     Temp Source 05/25/19 2145 Oral     SpO2 05/25/19 2145 100 %     Weight 05/25/19 2146 130 lb (59 kg)     Height 05/25/19 2146 5' 7"  (1.702 m)   Constitutional: Alert and oriented. Patient is tearful but ambulatory in the room. No acute distress.  Eyes: Conjunctivae are normal.  Head: Atraumatic. Nose: No congestion/rhinnorhea. Mouth/Throat: Mucous membranes are moist. Neck: No stridor.   Cardiovascular: Normal rate, regular rhythm. Good peripheral circulation. Grossly normal heart sounds.   Respiratory: Normal respiratory effort.  No retractions. Lungs CTAB. Gastrointestinal: Soft and nontender. No distention.  Musculoskeletal: No gross deformities of extremities. Neurologic:  Normal speech and language.  Skin:  Skin is warm, dry and intact. No rash noted. ____________________________________________   LABS (all labs ordered are listed, but only abnormal results are displayed)  Labs Reviewed  CBC WITH DIFFERENTIAL/PLATELET - Abnormal; Notable for the following components:      Result Value   WBC 3.8 (*)    RBC 3.81  (*)    Hemoglobin 10.3 (*)    HCT 32.7 (*)    All other components within normal limits  COMPREHENSIVE METABOLIC PANEL - Abnormal; Notable for the following components:   Chloride 96 (*)    Glucose, Bld 447 (*)    BUN 25 (*)    Creatinine, Ser 1.08 (*)    Albumin 3.2 (*)    All other components within normal limits  D-DIMER, QUANTITATIVE (NOT AT [facility]) - Abnormal; Notable for the following components:   D-Dimer, Quant 1.35 (*)    All other components within normal limits  LACTATE DEHYDROGENASE - Abnormal; Notable for the following components:   LDH 252 (*)    All other components within normal limits  TRIGLYCERIDES - Abnormal; Notable for the following components:   Triglycerides 209 (*)    All other components within normal limits  FIBRINOGEN - Abnormal; Notable for the following components:   Fibrinogen 558 (*)    All other components within normal limits  CULTURE, BLOOD (ROUTINE X 2)  CULTURE, BLOOD (ROUTINE X 2)  LACTIC ACID, PLASMA  LACTIC ACID, PLASMA  PROCALCITONIN  FERRITIN  C-REACTIVE PROTEIN  I-STAT BETA HCG BLOOD, ED ([facility], [facility], [facility] ONLY)   ____________________________________________  EKG   EKG Interpretation  Date/Time:  Wednesday May 25 2019 21:49:45 EST Ventricular Rate:  100 PR Interval:    QRS Duration: 78 QT Interval:  333 QTC Calculation: 430 R Axis:   75 Text Interpretation: Sinus tachycardia Baseline wander in lead(s) I No STEMI Confirmed by Nanda Quinton [phone]) on 05/25/2019 9:54:00 PM       ____________________________________________  RADIOLOGY  DG Chest Port 1 View  Result Date: 05/25/2019 CLINICAL DATA:  Shortness of breath, back pain, diabetic, COVID EXAM: PORTABLE CHEST 1 VIEW COMPARISON:  Radiograph 03/13/2019 FINDINGS: No consolidation, features of edema, pneumothorax, or effusion. Pulmonary vascularity is normally distributed. The cardiomediastinal contours are unremarkable. No acute osseous or soft tissue abnormality.  Cholecystectomy clips present in the right upper quadrant. IMPRESSION: No acute cardiopulmonary abnormality. Electronically Signed   By: Lovena Le M.D.   On: 05/25/2019 22:31    ____________________________________________   PROCEDURES  Procedure(s) performed:   Procedures  None  ____________________________________________   INITIAL IMPRESSION / ASSESSMENT AND PLAN / ED COURSE  Pertinent labs & imaging results that were available during my care of the patient were reviewed by me and considered in my medical decision making (see chart for details).   Patient presents to the emergency department for evaluation of nausea, vomiting, abdominal pain. She is developed some chest discomfort in the setting of recent Covid infection. Plan for preadmit Covid labs and reassess after droperidol, IV fluids, Protonix. Will need to r/o DKA.   Lab work reviewed and consistent with COVID-19 infection.  D-dimer is slightly elevated but in line with what I would expect with active COVID-19 infection.  Chest x-ray does not show infiltrate.  Patient is not hypoxemic.  She was given droperidol and symptoms have improved and she is no longer having chest discomfort after her vomiting has stopped.  No evidence of DKA.  I reassessed the patient she states she is feeling better and would like to be discharged. ____________________________________________  FINAL CLINICAL IMPRESSION(S) / ED DIAGNOSES  Final diagnoses:  Non-intractable vomiting with nausea, unspecified vomiting type  COVID-19     MEDICATIONS GIVEN DURING THIS VISIT:  Medications  insulin aspart (novoLOG) injection 8 Units (has no administration in time range)  droperidol (INAPSINE) 2.5 MG/ML injection 2.5 mg (2.5 mg Intravenous Given 05/25/19 2245)  sodium chloride 0.9 % bolus 1,000 mL (1,000 mLs Intravenous New Bag/Given 05/25/19 2244)  pantoprazole (PROTONIX) injection 40 mg (40 mg Intravenous Given 05/25/19 2244)    Note:  This  document was prepared using Dragon voice recognition software and may include unintentional dictation errors.  Nanda Quinton, MD, [facility]    Shylee Durrett, [facility], MD 05/26/19 [phone]

## 2019-05-25 NOTE — ED Triage Notes (Signed)
Pt reports N/V for the last 4 days. Also reports SOB, she tested positive for COVID on 2/20. She denies cough, but says that she has had a fever off and on. No signs of respiratory distress noted. She also reports back pain and chest tenderness.

## 2019-05-26 LAB — D-DIMER, QUANTITATIVE: D-Dimer, Quant: 1.35 ug/mL-FEU — ABNORMAL HIGH (ref 0.00–0.50)

## 2019-05-26 LAB — PROCALCITONIN: Procalcitonin: <0.1 ng/mL

## 2019-05-26 LAB — C-REACTIVE PROTEIN: CRP: <0.5 mg/dL (ref <1.0)

## 2019-05-26 LAB — TRIGLYCERIDES: Triglycerides: 209 mg/dL — ABNORMAL HIGH (ref <150)

## 2019-05-26 LAB — FERRITIN: Ferritin: 207 ng/mL (ref 11–307)

## 2019-05-26 NOTE — Discharge Instructions (Signed)
You were seen in the emergency department today with nausea and vomiting.  Please take your home medications and follow closely with your primary care doctor as well as her gastroenterologist.  Return to the emergency department any new or suddenly worsening symptoms.

## 2019-05-27 ENCOUNTER — Emergency Department (HOSPITAL_COMMUNITY)
Admission: EM | Admit: 2019-05-27 | Discharge: 2019-05-27 | Disposition: A | Discharge status: Home / Self Care | Payer: 59 | Attending: Emergency Medicine | Admitting: Emergency Medicine

## 2019-05-27 ENCOUNTER — Encounter (HOSPITAL_COMMUNITY): Payer: Self-pay | Admitting: Emergency Medicine

## 2019-05-27 ENCOUNTER — Other Ambulatory Visit: Payer: Self-pay

## 2019-05-27 DIAGNOSIS — K3184 Gastroparesis: Secondary | ICD-10-CM | POA: Diagnosis not present

## 2019-05-27 DIAGNOSIS — Z79899 Other long term (current) drug therapy: Secondary | ICD-10-CM | POA: Diagnosis not present

## 2019-05-27 DIAGNOSIS — U071 COVID-19: Secondary | ICD-10-CM | POA: Diagnosis not present

## 2019-05-27 DIAGNOSIS — R1010 Upper abdominal pain, unspecified: Secondary | ICD-10-CM | POA: Diagnosis present

## 2019-05-27 DIAGNOSIS — Z794 Long term (current) use of insulin: Secondary | ICD-10-CM | POA: Diagnosis not present

## 2019-05-27 DIAGNOSIS — R739 Hyperglycemia, unspecified: Secondary | ICD-10-CM

## 2019-05-27 DIAGNOSIS — I10 Essential (primary) hypertension: Secondary | ICD-10-CM | POA: Insufficient documentation

## 2019-05-27 DIAGNOSIS — E1065 Type 1 diabetes mellitus with hyperglycemia: Secondary | ICD-10-CM | POA: Insufficient documentation

## 2019-05-27 LAB — URINALYSIS, ROUTINE W REFLEX MICROSCOPIC
Bilirubin Urine: NEGATIVE
Glucose, UA: >=500 mg/dL — AB
Ketones, ur: 20 mg/dL — AB
Leukocytes,Ua: NEGATIVE
Nitrite: NEGATIVE
Protein, ur: >=300 mg/dL — AB
Specific Gravity, Urine: 1.021 (ref 1.005–1.030)
pH: 6 (ref 5.0–8.0)

## 2019-05-27 LAB — CBC WITH DIFFERENTIAL/PLATELET
Abs Immature Granulocytes: 0.01 10*3/uL (ref 0.00–0.07)
Basophils Absolute: 0 10*3/uL (ref 0.0–0.1)
Basophils Relative: 0 %
Eosinophils Absolute: 0 10*3/uL (ref 0.0–0.5)
Eosinophils Relative: 0 %
HCT: 29 % — ABNORMAL LOW (ref 36.0–46.0)
Hemoglobin: 9.6 g/dL — ABNORMAL LOW (ref 12.0–15.0)
Immature Granulocytes: 0 %
Lymphocytes Relative: 57 %
Lymphs Abs: 1.4 10*3/uL (ref 0.7–4.0)
MCH: 27.8 pg (ref 26.0–34.0)
MCHC: 33.1 g/dL (ref 30.0–36.0)
MCV: 84.1 fL (ref 80.0–100.0)
Monocytes Absolute: 0.2 10*3/uL (ref 0.1–1.0)
Monocytes Relative: 9 %
Neutro Abs: 0.9 10*3/uL — ABNORMAL LOW (ref 1.7–7.7)
Neutrophils Relative %: 34 %
Platelets: 185 10*3/uL (ref 150–400)
RBC: 3.45 MIL/uL — ABNORMAL LOW (ref 3.87–5.11)
RDW: 12.9 % (ref 11.5–15.5)
WBC: 2.5 10*3/uL — ABNORMAL LOW (ref 4.0–10.5)
nRBC: 0 % (ref 0.0–0.2)

## 2019-05-27 LAB — I-STAT BETA HCG BLOOD, ED (MC, WL, AP ONLY): I-stat hCG, quantitative: <5 m[IU]/mL (ref <5)

## 2019-05-27 LAB — COMPREHENSIVE METABOLIC PANEL
ALT: 38 U/L (ref 0–44)
AST: 43 U/L — ABNORMAL HIGH (ref 15–41)
Albumin: 2.9 g/dL — ABNORMAL LOW (ref 3.5–5.0)
Alkaline Phosphatase: 77 U/L (ref 38–126)
Anion gap: 16 — ABNORMAL HIGH (ref 5–15)
BUN: 20 mg/dL (ref 6–20)
CO2: 29 mmol/L (ref 22–32)
Calcium: 8.7 mg/dL — ABNORMAL LOW (ref 8.9–10.3)
Chloride: 93 mmol/L — ABNORMAL LOW (ref 98–111)
Creatinine, Ser: 1.52 mg/dL — ABNORMAL HIGH (ref 0.44–1.00)
GFR calc Af Amer: 53 mL/min — ABNORMAL LOW (ref >60)
GFR calc non Af Amer: 46 mL/min — ABNORMAL LOW (ref >60)
Glucose, Bld: 478 mg/dL — ABNORMAL HIGH (ref 70–99)
Potassium: 3.9 mmol/L (ref 3.5–5.1)
Sodium: 138 mmol/L (ref 135–145)
Total Bilirubin: 0.8 mg/dL (ref 0.3–1.2)
Total Protein: 6.6 g/dL (ref 6.5–8.1)

## 2019-05-27 LAB — LIPASE, BLOOD: Lipase: 17 U/L (ref 11–51)

## 2019-05-27 LAB — CBG MONITORING, ED: Glucose-Capillary: 183 mg/dL — ABNORMAL HIGH (ref 70–99)

## 2019-05-27 MED ORDER — SODIUM CHLORIDE 0.9 % IV BOLUS
1000.0000 mL | Freq: Once | INTRAVENOUS | Status: AC
  Administered 2019-05-27: 1000 mL via INTRAVENOUS

## 2019-05-27 MED ORDER — MORPHINE SULFATE (PF) 4 MG/ML IV SOLN
4.0000 mg | Freq: Once | INTRAVENOUS | Status: DC
  Filled 2019-05-27: qty 1

## 2019-05-27 MED ORDER — DROPERIDOL 2.5 MG/ML IJ SOLN
2.5000 mg | Freq: Once | INTRAMUSCULAR | Status: AC
  Administered 2019-05-27: 2.5 mg via INTRAVENOUS
  Filled 2019-05-27: qty 2

## 2019-05-27 MED ORDER — INSULIN ASPART 100 UNIT/ML ~~LOC~~ SOLN
10.0000 [IU] | Freq: Once | SUBCUTANEOUS | Status: AC
  Administered 2019-05-27: 10 [IU] via INTRAVENOUS

## 2019-05-27 MED ORDER — DIPHENHYDRAMINE HCL 50 MG/ML IJ SOLN
25.0000 mg | Freq: Once | INTRAMUSCULAR | Status: AC
  Administered 2019-05-27: 25 mg via INTRAVENOUS
  Filled 2019-05-27: qty 1

## 2019-05-27 NOTE — ED Notes (Signed)
Pt verbalized understanding of discharge instructions. Follow up care reviewed, pt brought to lobby via wheelchair to wait for ride.

## 2019-05-27 NOTE — ED Provider Notes (Signed)
Patient here with gastroparesis.  She is receiving IV medication.  Blood sugar is elevated and there is usual for her and there is no evidence of DKA. Patient feels improved States taking po and feels ready to go home   Pattricia Boss, MD 05/27/19 1226

## 2019-05-27 NOTE — ED Notes (Signed)
Pt given water and crackers.

## 2019-05-27 NOTE — ED Notes (Signed)
IV attempted, unsuccessful. IV team consult ordered.

## 2019-05-27 NOTE — ED Provider Notes (Addendum)
[facility] EMERGENCY DEPARTMENT Provider Note   CSN: [id number] Arrival date & time: 05/27/19  2330     History Chief Complaint  Patient presents with  . Covid+ / Abdominal Pain/Emesis/Diarrhea    [name] is a 29 y.o. female.  Patient presents to the emergency department for evaluation of abdominal pain with nausea and vomiting.  Patient has a history of gastroparesis/cyclic vomiting syndrome.  She has been seen multiple times in the ER recently.  Patient reports that she has had persistent pain with pain in the upper abdomen and into her back.  No urinary symptoms.  No diarrhea, constipation.  No hematemesis or melanotic stools.        Past Medical History:  Diagnosis Date  . Allergy   . Anemia   . Anxiety   . Blood transfusion without reported diagnosis    Dec 2018  . Cataract    right eye  . COVID-19   . Depression   . Diabetes type 1, uncontrolled ([place]) 11/14/2011   Since age 33  . Fibromyalgia   . Gastroparesis   . GERD (gastroesophageal reflux disease)   . Hypertension   . Infection    UTI April 2016    Patient Active Problem List   Diagnosis Date Noted  . Nausea & vomiting 04/30/2019  . Hypertensive urgency 04/30/2019  . Intractable nausea and vomiting 04/30/2019  . Contraception management 08/06/2018  . Cyclical vomiting syndrome 06/07/2018  . Diabetic gastroparesis associated with type 1 diabetes mellitus ([place]) 06/04/2018  . Anxiety 05/31/2018  . Peripheral edema 05/31/2018  . Normocytic anemia 07/10/2017  . GERD (gastroesophageal reflux disease) 06/23/2017  . 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 Diabetes mellitus type 1 ([place]) 04/28/2013  . Protein-calorie malnutrition, severe ([place]) 04/28/2013  . Hypertension associated with diabetes ([place]) 11/14/2011  . Chronic abdominal pain 09/18/2011    Past Surgical History:  Procedure Laterality Date  . ANKLE SURGERY    . CHOLECYSTECTOMY   11/15/2011   Procedure: LAPAROSCOPIC CHOLECYSTECTOMY WITH INTRAOPERATIVE CHOLANGIOGRAM;  Surgeon: [name], MD;  Location: [facility];  Service: General;  Laterality: N/A;  . COLONOSCOPY    . COLONOSCOPY WITH PROPOFOL N/A 06/27/2017   Procedure: COLONOSCOPY WITH PROPOFOL;  Surgeon: Milus Banister, MD;  Location: [facility] END[facility]COPY;  Service: Endoscopy;  Laterality: N/A;  . ESOPHAGOGASTRODUODENOSCOPY  12/03/2011   Procedure: ESOPHAGOGASTRODUODENOSCOPY (EGD);  Surgeon: Beryle Beams, MD;  Location: [facility];  Service: Endoscopy;  Laterality: N/A;  . FLEXIBLE SIGMOIDOSCOPY N/A 03/10/2017   Procedure: FLEXIBLE SIGMOIDOSCOPY;  Surgeon: Carol Ada, MD;  Location: [facility];  Service: Endoscopy;  Laterality: N/A;  . INCISION AND DRAINAGE PERIRECTAL ABSCESS N/A 03/01/2017   Procedure: IRRIGATION AND DEBRIDEMENT PERIRECTAL ABSCESS;  Surgeon: Alphonsa Overall, MD;  Location: [facility];  Service: General;  Laterality: N/A;  . IRRIGATION AND DEBRIDEMENT BUTTOCKS N/A 03/23/2017   Procedure: IRRIGATION AND DEBRIDEMENT BUTTOCKS, SETON PLACEMENT;  Surgeon: [name], MD;  Location: [facility];  Service: General;  Laterality: N/A;  . LAPAROSCOPY  11/23/2011   Procedure: LAPAROSCOPY DIAGNOSTIC;  Surgeon: Edward Jolly, MD;  Location: [facility];  Service: General;  Laterality: N/A;  . SIGMOIDOSCOPY    . UPPER GASTROINTESTINAL ENDOSCOPY    . WISDOM TOOTH EXTRACTION       OB History    Gravida  1   Para  1   Term      Preterm  1   AB  Living  1     SAB      TAB      Ectopic      Multiple  0   Live Births  1           Family History  Problem Relation Age of Onset  . Diabetes Mother   . Hypertension Father   . Colon cancer Paternal Grandmother        pt thinks PGM was dx in her 50's  . Diabetes Paternal Grandmother   . Diabetes Maternal Grandmother   . Diabetes Maternal Grandfather   . Diabetes Paternal Grandfather   . Diabetes Other   . Esophageal cancer Neg Hx   . Liver  cancer Neg Hx   . Pancreatic cancer Neg Hx   . Stomach cancer Neg Hx   . Rectal cancer Neg Hx     Social History   Tobacco Use  . Smoking status: Never Smoker  . Smokeless tobacco: Never Used  Substance Use Topics  . Alcohol use: No    Alcohol/week: 0.0 standard drinks  . Drug use: Yes    Types: Marijuana    Home Medications Prior to Admission medications   Medication Sig Start Date End Date Taking? Authorizing Provider  Continuous Blood Gluc Receiver ([place]) [place] See admin instructions. USE AS DIRECTED TO MONITOR GLUCOSE LEVELS 10/29/18   [provider]  Continuous Blood Gluc Sensor (DEXCOM G6 SENSOR) MISC 1 each by Does not apply route See admin instructions. every 10 days. 12/31/18   Renato Shin, MD  Continuous Blood Gluc Transmit (DEXCOM G6 TRANSMITTER) MISC See admin instructions. USE AS DIRECTED TO MONITOR GLUCOSE LEVELS 10/29/18   [provider]  cyclobenzaprine (FLEXERIL) 10 MG tablet TAKE 1 TABLET(10 MG) BY MOUTH THREE TIMES DAILY AS NEEDED FOR MUSCLE SPASMS Patient taking differently: Take 10 mg by mouth 3 (three) times daily as needed for muscle spasms.  04/29/19   [name], MD  dicyclomine (BENTYL) 20 MG tablet Take 1 tablet (20 mg total) by mouth 3 (three) times daily as needed for spasms. 05/01/19   [name], MD  furosemide (LASIX) 40 MG tablet TAKE 1 TABLET BY MOUTH  DAILY Patient taking differently: Take 40 mg by mouth daily.  11/11/18   Armbruster, Carlota Raspberry, MD  hydrALAZINE (APRESOLINE) 50 MG tablet Take 1 tablet (50 mg total) by mouth every 6 (six) hours. 10/23/18   Swayze, Ava, DO  Insulin Glargine (LANTUS SOLOSTAR) 100 UNIT/ML Solostar Pen Inject 10 Units into the skin at bedtime. And pen needles 4/day Patient taking differently: Inject 12 Units into the skin at bedtime. And pen needles 4/day 12/31/18   Renato Shin, MD  insulin lispro (HUMALOG KWIKPEN) 100 UNIT/ML KwikPen Inject 0.06 mLs (6 Units total) into the skin  3 (three) times daily. Patient taking differently: Inject 5 Units into the skin 3 (three) times daily.  12/31/18   Renato Shin, MD  loperamide (IMODIUM) 2 MG capsule Take 6 mg by mouth as needed for diarrhea or loose stools.    [provider]  LORazepam (ATIVAN) 0.5 MG tablet Take 1 tablet (0.5 mg total) by mouth 2 (two) times daily as needed for anxiety. 04/05/19   [name], MD  metoprolol tartrate (LOPRESSOR) 25 MG tablet Take 1 tablet (25 mg total) by mouth 2 (two) times daily. 05/22/19 06/21/19  Arrien, Jimmy Picket, MD  mirtazapine (REMERON) 30 MG tablet TAKE 1 TABLET BY MOUTH AT  BEDTIME Patient taking differently:  Take 30 mg by mouth at bedtime.  04/19/19   [name], MD  ondansetron (ZOFRAN) 4 MG tablet Take 1 tablet (4 mg total) by mouth every 8 (eight) hours as needed for nausea or vomiting. 05/22/19   Arrien, Jimmy Picket, MD  OVER THE COUNTER MEDICATION Take 2 tablets by mouth daily. 2 chewable gummies called Flo for period    [provider]  pantoprazole (PROTONIX) 40 MG tablet TAKE 1 TABLET BY MOUTH  DAILY Patient taking differently: Take 40 mg by mouth daily.  10/22/18   Armbruster, Carlota Raspberry, MD  metoCLOPramide (REGLAN) 10 MG tablet Take 1 tablet (10 mg total) by mouth every 6 (six) hours as needed for nausea or vomiting. Patient not taking: Reported on 04/23/2019 02/20/18 04/23/19  Shelly Coss, MD  potassium chloride 20 MEQ TBCR Take 20 mEq by mouth 2 (two) times daily. Patient not taking: Reported on 03/31/2019 10/17/18 04/23/19  Isla Pence, MD  potassium chloride SA (KLOR-CON) 20 MEQ tablet Take 1 tablet (20 mEq total) by mouth daily. Patient not taking: Reported on 03/31/2019 03/14/19 04/23/19  Drenda Freeze, MD    Allergies    Other and Lactose intolerance (gi)  Review of Systems   Review of Systems  Gastrointestinal: Positive for abdominal pain, nausea and vomiting.  All other systems reviewed and are negative.   Physical  Exam Updated Vital Signs BP 114/89   Pulse 95   Temp 98 F (36.7 C) (Oral)   Resp 16   Ht 5' 7"  (1.702 m)   Wt 65 kg   LMP 04/30/2019   SpO2 98%   BMI 22.44 kg/m   Physical Exam Vitals and nursing note reviewed.  Constitutional:      General: She is not in acute distress.    Appearance: Normal appearance. She is well-developed.  HENT:     Head: Normocephalic and atraumatic.     Right Ear: Hearing normal.     Left Ear: Hearing normal.     Nose: Nose normal.  Eyes:     Conjunctiva/sclera: Conjunctivae normal.     Pupils: Pupils are equal, round, and reactive to light.  Cardiovascular:     Rate and Rhythm: Regular rhythm.     Heart sounds: S1 normal and S2 normal. No murmur. No friction rub. No gallop.   Pulmonary:     Effort: Pulmonary effort is normal. No respiratory distress.     Breath sounds: Normal breath sounds.  Chest:     Chest wall: No tenderness.  Abdominal:     General: Bowel sounds are normal.     Palpations: Abdomen is soft.     Tenderness: There is abdominal tenderness in the epigastric area. There is no guarding or rebound. Negative signs include Murphy's sign and McBurney's sign.     Hernia: No hernia is present.  Musculoskeletal:        General: Normal range of motion.     Cervical back: Normal range of motion and neck supple.  Skin:    General: Skin is warm and dry.     Findings: No rash.  Neurological:     Mental Status: She is alert and oriented to person, place, and time.     GCS: GCS eye subscore is 4. GCS verbal subscore is 5. GCS motor subscore is 6.     Cranial Nerves: No cranial nerve deficit.     Sensory: No sensory deficit.     Coordination: Coordination normal.  Psychiatric:  Speech: Speech normal.        Behavior: Behavior normal.        Thought Content: Thought content normal.     ED Results / Procedures / Treatments   Labs (all labs ordered are listed, but only abnormal results are displayed) Labs Reviewed  CBC WITH  DIFFERENTIAL/PLATELET - Abnormal; Notable for the following components:      Result Value   WBC 2.5 (*)    RBC 3.45 (*)    Hemoglobin 9.6 (*)    HCT 29.0 (*)    Neutro Abs 0.9 (*)    All other components within normal limits  COMPREHENSIVE METABOLIC PANEL - Abnormal; Notable for the following components:   Chloride 93 (*)    Glucose, Bld 478 (*)    Creatinine, Ser 1.52 (*)    Calcium 8.7 (*)    Albumin 2.9 (*)    AST 43 (*)    GFR calc non Af Amer 46 (*)    GFR calc Af Amer 53 (*)    Anion gap 16 (*)    All other components within normal limits  URINALYSIS, ROUTINE W REFLEX MICROSCOPIC - Abnormal; Notable for the following components:   Glucose, UA >=500 (*)    Hgb urine dipstick MODERATE (*)    Ketones, ur 20 (*)    Protein, ur >=300 (*)    Bacteria, UA RARE (*)    All other components within normal limits  LIPASE, BLOOD  PATHOLOGIST SMEAR REVIEW  I-STAT BETA HCG BLOOD, ED ([facility], [facility], [facility] ONLY)    EKG None  Radiology DG Chest Port 1 View  Result Date: 05/25/2019 CLINICAL DATA:  Shortness of breath, back pain, diabetic, COVID EXAM: PORTABLE CHEST 1 VIEW COMPARISON:  Radiograph 03/13/2019 FINDINGS: No consolidation, features of edema, pneumothorax, or effusion. Pulmonary vascularity is normally distributed. The cardiomediastinal contours are unremarkable. No acute osseous or soft tissue abnormality. Cholecystectomy clips present in the right upper quadrant. IMPRESSION: No acute cardiopulmonary abnormality. Electronically Signed   By: Lovena Le M.D.   On: 05/25/2019 22:31    Procedures Procedures (including critical care time)  Medications Ordered in ED Medications  sodium chloride 0.9 % bolus 1,000 mL (has no administration in time range)  droperidol (INAPSINE) 2.5 MG/ML injection 2.5 mg (has no administration in time range)  diphenhydrAMINE (BENADRYL) injection 25 mg (has no administration in time range)  insulin aspart (novoLOG) injection 10 Units (has no  administration in time range)    ED Course  I have reviewed the triage vital signs and the nursing notes.  Pertinent labs & imaging results that were available during my care of the patient were reviewed by me and considered in my medical decision making (see chart for details).    MDM Rules/Calculators/A&P                      Patient presents to the emergency department for evaluation of nausea and vomiting.  Patient with multiple similar visits in the past.  She does not have an acute surgical abdomen.  Lab work is unrevealing.  She has elevated glucose but does not appear to be in DKA.  Patient does not appear to have good glucose control historically.  No leukocytosis.  Hemoglobin at baseline.  Hematuria noted but this is chronic.  She was given fluids and IV insulin here in the ER.  I do not see any indication for admission at this time.  Final Clinical Impression(s) / ED Diagnoses  Final diagnoses:  Hyperglycemia  Gastroparesis    Rx / DC Orders ED Discharge Orders    None       Mosi Hannold, Gwenyth Allegra, MD 05/27/19 [id number]    Orpah Greek, MD 05/27/19 [phone]

## 2019-05-27 NOTE — ED Triage Notes (Signed)
Patient tested positive for Covid19 on 05/21/19 , reports generalized abdominal pain with emesis and diarrhea , fatigue / back pain this week .

## 2019-05-28 ENCOUNTER — Encounter (HOSPITAL_COMMUNITY): Payer: Self-pay

## 2019-05-28 ENCOUNTER — Emergency Department (HOSPITAL_COMMUNITY)
Admission: EM | Admit: 2019-05-28 | Discharge: 2019-05-28 | Disposition: A | Discharge status: Home / Self Care | Payer: 59 | Attending: Emergency Medicine | Admitting: Emergency Medicine

## 2019-05-28 ENCOUNTER — Emergency Department (HOSPITAL_COMMUNITY): Payer: 59

## 2019-05-28 ENCOUNTER — Other Ambulatory Visit: Payer: Self-pay

## 2019-05-28 DIAGNOSIS — Z794 Long term (current) use of insulin: Secondary | ICD-10-CM | POA: Insufficient documentation

## 2019-05-28 DIAGNOSIS — Z8616 Personal history of COVID-19: Secondary | ICD-10-CM | POA: Diagnosis not present

## 2019-05-28 DIAGNOSIS — Z79899 Other long term (current) drug therapy: Secondary | ICD-10-CM | POA: Insufficient documentation

## 2019-05-28 DIAGNOSIS — M549 Dorsalgia, unspecified: Secondary | ICD-10-CM | POA: Diagnosis not present

## 2019-05-28 DIAGNOSIS — R1013 Epigastric pain: Secondary | ICD-10-CM | POA: Diagnosis not present

## 2019-05-28 DIAGNOSIS — E1065 Type 1 diabetes mellitus with hyperglycemia: Secondary | ICD-10-CM | POA: Insufficient documentation

## 2019-05-28 DIAGNOSIS — R739 Hyperglycemia, unspecified: Secondary | ICD-10-CM

## 2019-05-28 DIAGNOSIS — R197 Diarrhea, unspecified: Secondary | ICD-10-CM | POA: Diagnosis not present

## 2019-05-28 DIAGNOSIS — R112 Nausea with vomiting, unspecified: Secondary | ICD-10-CM | POA: Diagnosis not present

## 2019-05-28 DIAGNOSIS — I10 Essential (primary) hypertension: Secondary | ICD-10-CM | POA: Diagnosis not present

## 2019-05-28 LAB — URINALYSIS, ROUTINE W REFLEX MICROSCOPIC
Bacteria, UA: NONE SEEN
Bilirubin Urine: NEGATIVE
Glucose, UA: >=500 mg/dL — AB
Ketones, ur: 5 mg/dL — AB
Leukocytes,Ua: NEGATIVE
Nitrite: NEGATIVE
Protein, ur: 100 mg/dL — AB
Specific Gravity, Urine: 1.01 (ref 1.005–1.030)
pH: 6 (ref 5.0–8.0)

## 2019-05-28 LAB — COMPREHENSIVE METABOLIC PANEL
ALT: 45 U/L — ABNORMAL HIGH (ref 0–44)
AST: 38 U/L (ref 15–41)
Albumin: 3.5 g/dL (ref 3.5–5.0)
Alkaline Phosphatase: 83 U/L (ref 38–126)
Anion gap: 14 (ref 5–15)
BUN: 19 mg/dL (ref 6–20)
CO2: 29 mmol/L (ref 22–32)
Calcium: 8.2 mg/dL — ABNORMAL LOW (ref 8.9–10.3)
Chloride: 91 mmol/L — ABNORMAL LOW (ref 98–111)
Creatinine, Ser: 1.24 mg/dL — ABNORMAL HIGH (ref 0.44–1.00)
GFR calc Af Amer: >60 mL/min (ref >60)
GFR calc non Af Amer: 59 mL/min — ABNORMAL LOW (ref >60)
Glucose, Bld: 498 mg/dL — ABNORMAL HIGH (ref 70–99)
Potassium: 3 mmol/L — ABNORMAL LOW (ref 3.5–5.1)
Sodium: 134 mmol/L — ABNORMAL LOW (ref 135–145)
Total Bilirubin: 0.7 mg/dL (ref 0.3–1.2)
Total Protein: 7.9 g/dL (ref 6.5–8.1)

## 2019-05-28 LAB — CBC WITH DIFFERENTIAL/PLATELET
Abs Immature Granulocytes: 0.02 10*3/uL (ref 0.00–0.07)
Basophils Absolute: 0 10*3/uL (ref 0.0–0.1)
Basophils Relative: 1 %
Eosinophils Absolute: 0 10*3/uL (ref 0.0–0.5)
Eosinophils Relative: 1 %
HCT: 30.5 % — ABNORMAL LOW (ref 36.0–46.0)
Hemoglobin: 10.1 g/dL — ABNORMAL LOW (ref 12.0–15.0)
Immature Granulocytes: 1 %
Lymphocytes Relative: 31 %
Lymphs Abs: 1.2 10*3/uL (ref 0.7–4.0)
MCH: 27.8 pg (ref 26.0–34.0)
MCHC: 33.1 g/dL (ref 30.0–36.0)
MCV: 84 fL (ref 80.0–100.0)
Monocytes Absolute: 0.3 10*3/uL (ref 0.1–1.0)
Monocytes Relative: 7 %
Neutro Abs: 2.2 10*3/uL (ref 1.7–7.7)
Neutrophils Relative %: 59 %
Platelets: 220 10*3/uL (ref 150–400)
RBC: 3.63 MIL/uL — ABNORMAL LOW (ref 3.87–5.11)
RDW: 12.8 % (ref 11.5–15.5)
WBC: 3.7 10*3/uL — ABNORMAL LOW (ref 4.0–10.5)
nRBC: 0 % (ref 0.0–0.2)

## 2019-05-28 LAB — LACTIC ACID, PLASMA: Lactic Acid, Venous: 1.2 mmol/L (ref 0.5–1.9)

## 2019-05-28 LAB — CBG MONITORING, ED
Glucose-Capillary: 452 mg/dL — ABNORMAL HIGH (ref 70–99)
Glucose-Capillary: 340 mg/dL — ABNORMAL HIGH (ref 70–99)
Glucose-Capillary: 188 mg/dL — ABNORMAL HIGH (ref 70–99)

## 2019-05-28 LAB — HCG, QUANTITATIVE, PREGNANCY: hCG, Beta Chain, Quant, S: <1 m[IU]/mL (ref <5)

## 2019-05-28 LAB — BLOOD GAS, VENOUS
Acid-Base Excess: 5.2 mmol/L — ABNORMAL HIGH (ref 0.0–2.0)
Bicarbonate: 31 mmol/L — ABNORMAL HIGH (ref 20.0–28.0)
FIO2: 21
O2 Saturation: 46.5 %
Patient temperature: 98.6
pCO2, Ven: 54.6 mmHg (ref 44.0–60.0)
pH, Ven: 7.373 (ref 7.250–7.430)
pO2, Ven: <31 mmHg — CL (ref 32.0–45.0)

## 2019-05-28 LAB — LIPASE, BLOOD: Lipase: 19 U/L (ref 11–51)

## 2019-05-28 IMAGING — DX DG ABDOMEN ACUTE W/ 1V CHEST
3 series · 3 of 3 positions shown · non-contrast
Comparison: [DATE]

CLINICAL DATA: 28-year-old female with abdominal pain and vomiting

EXAM:
DG ABDOMEN ACUTE W/ 1V CHEST

[abdomen kub]
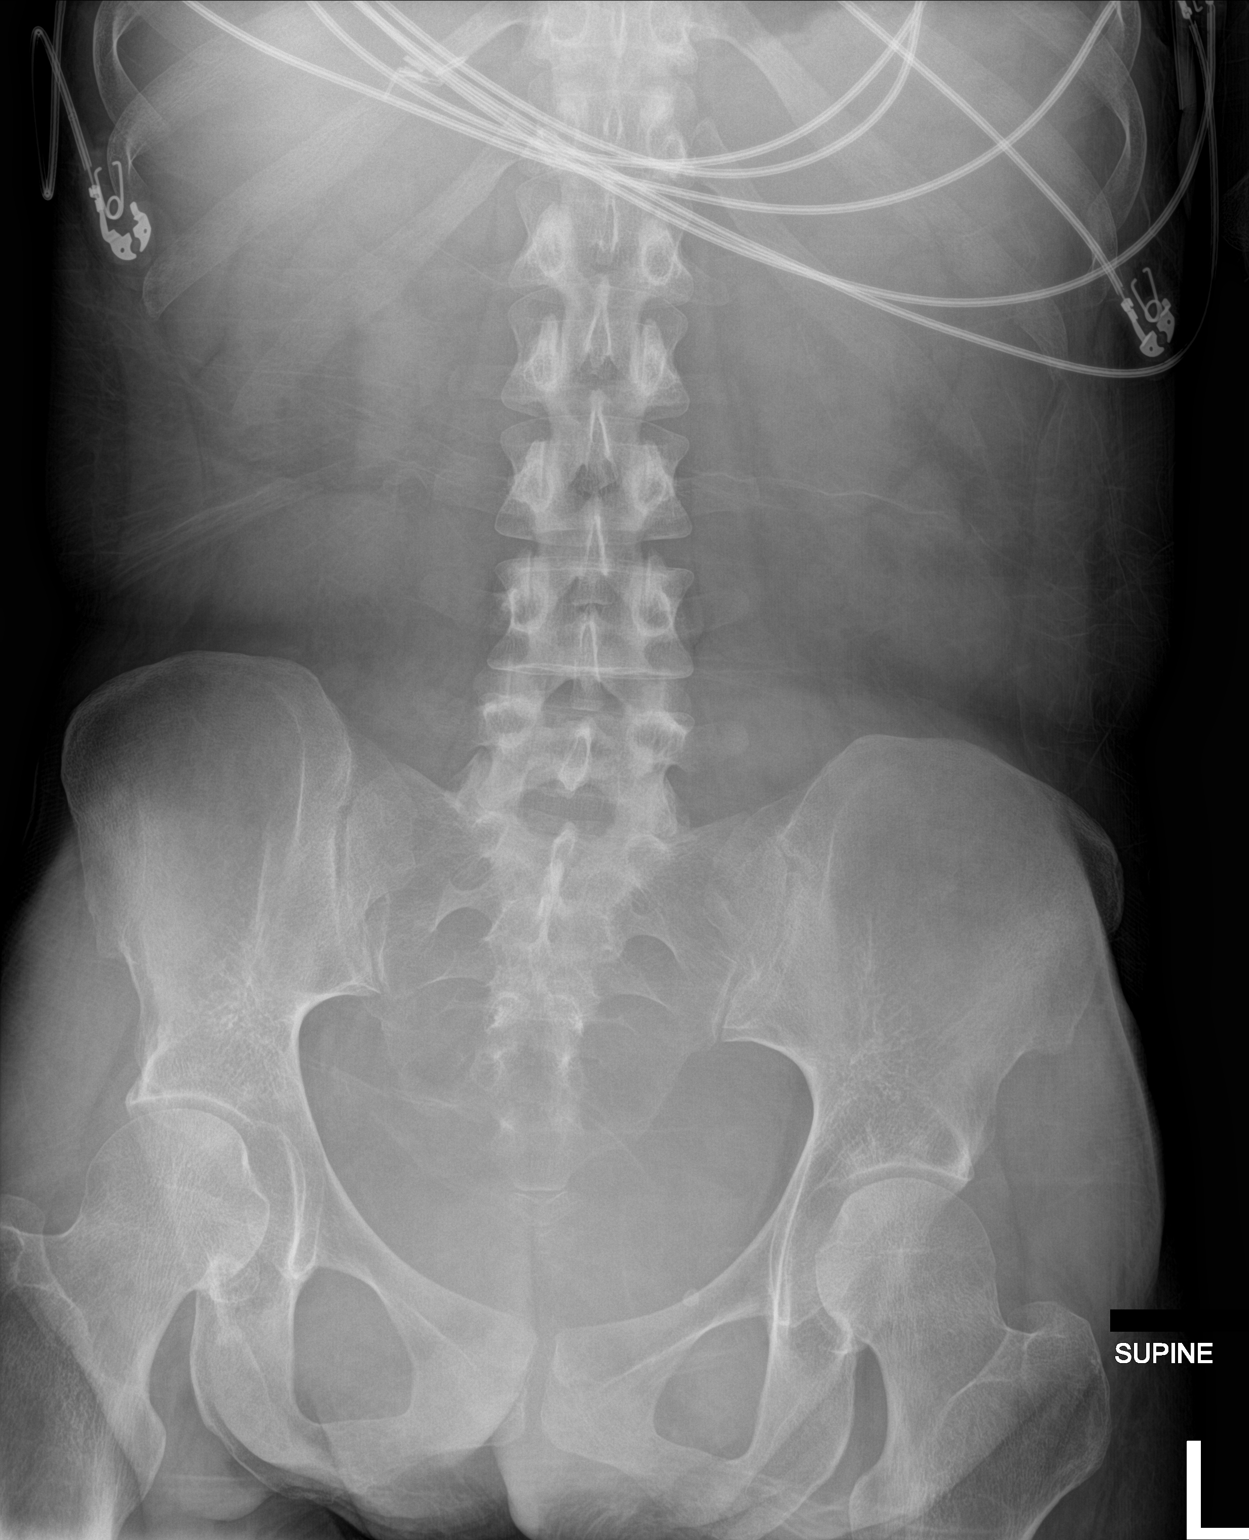

[chest ap]
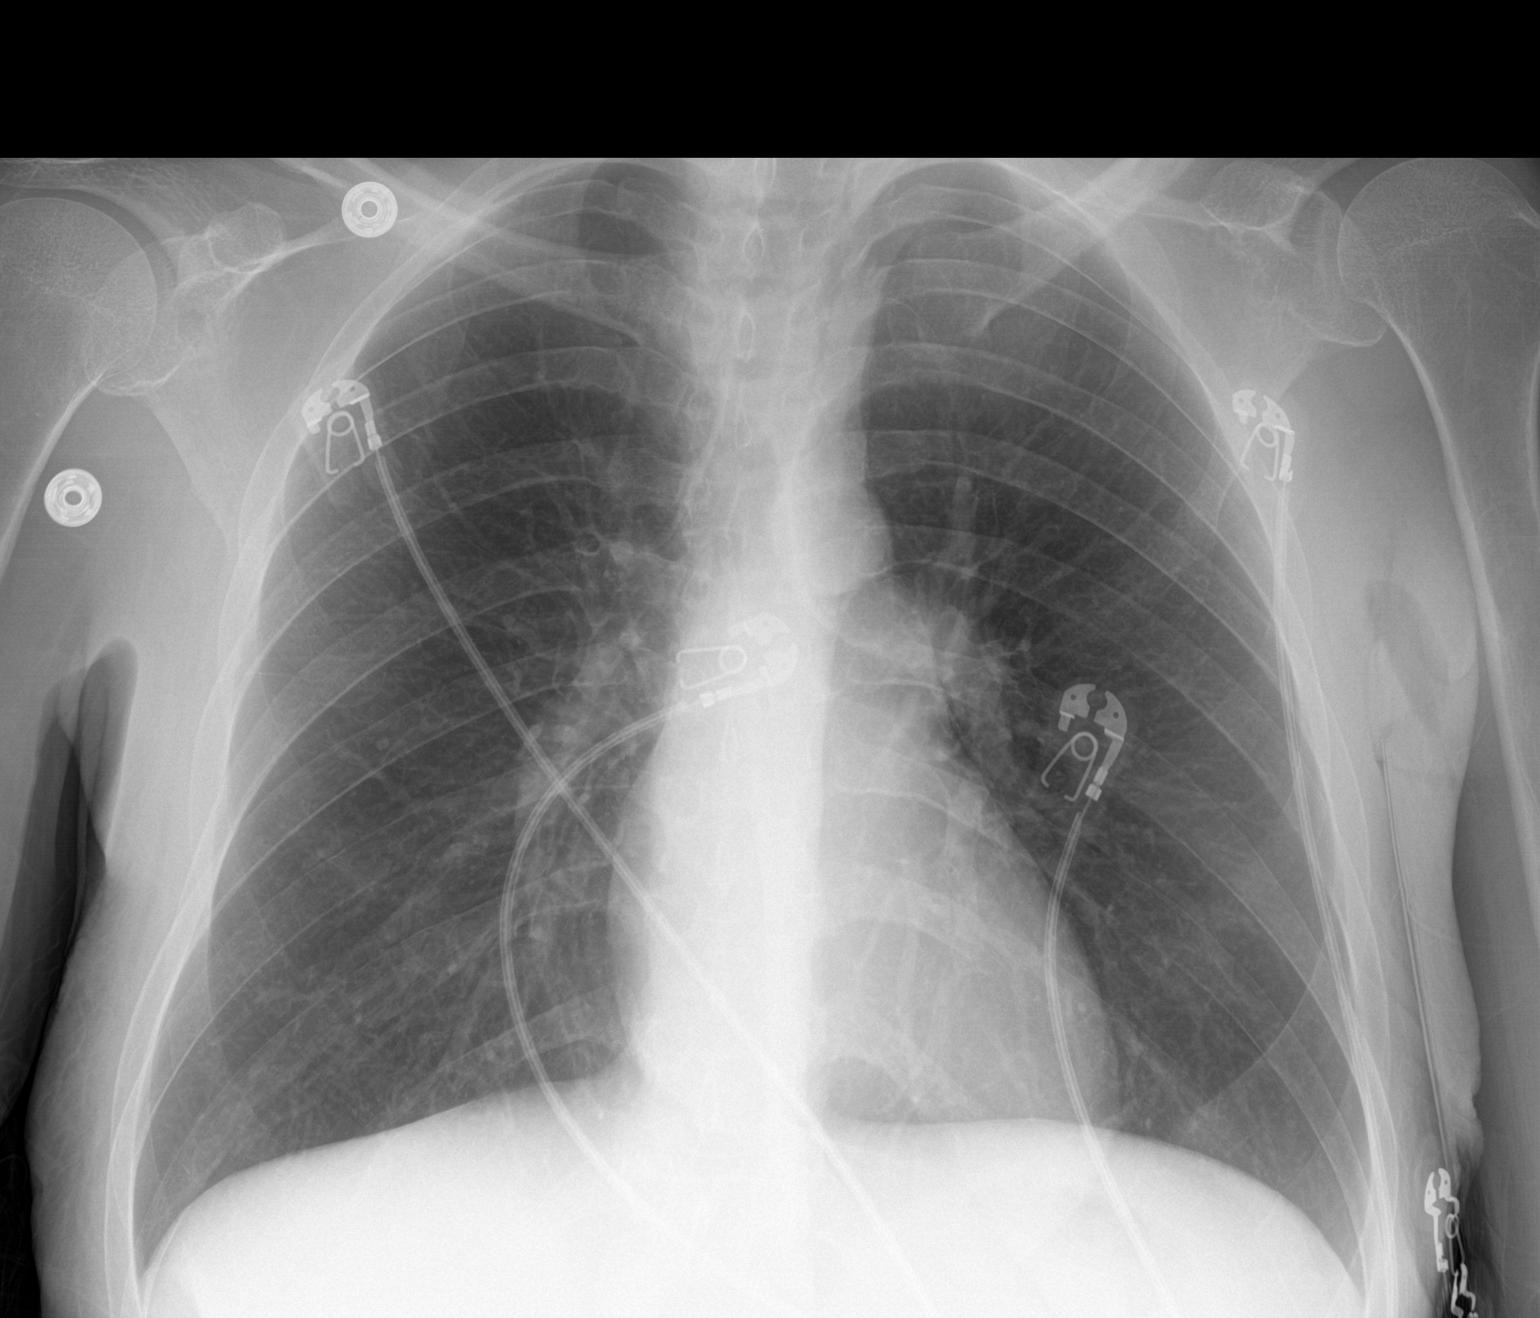

[abdomen erect]
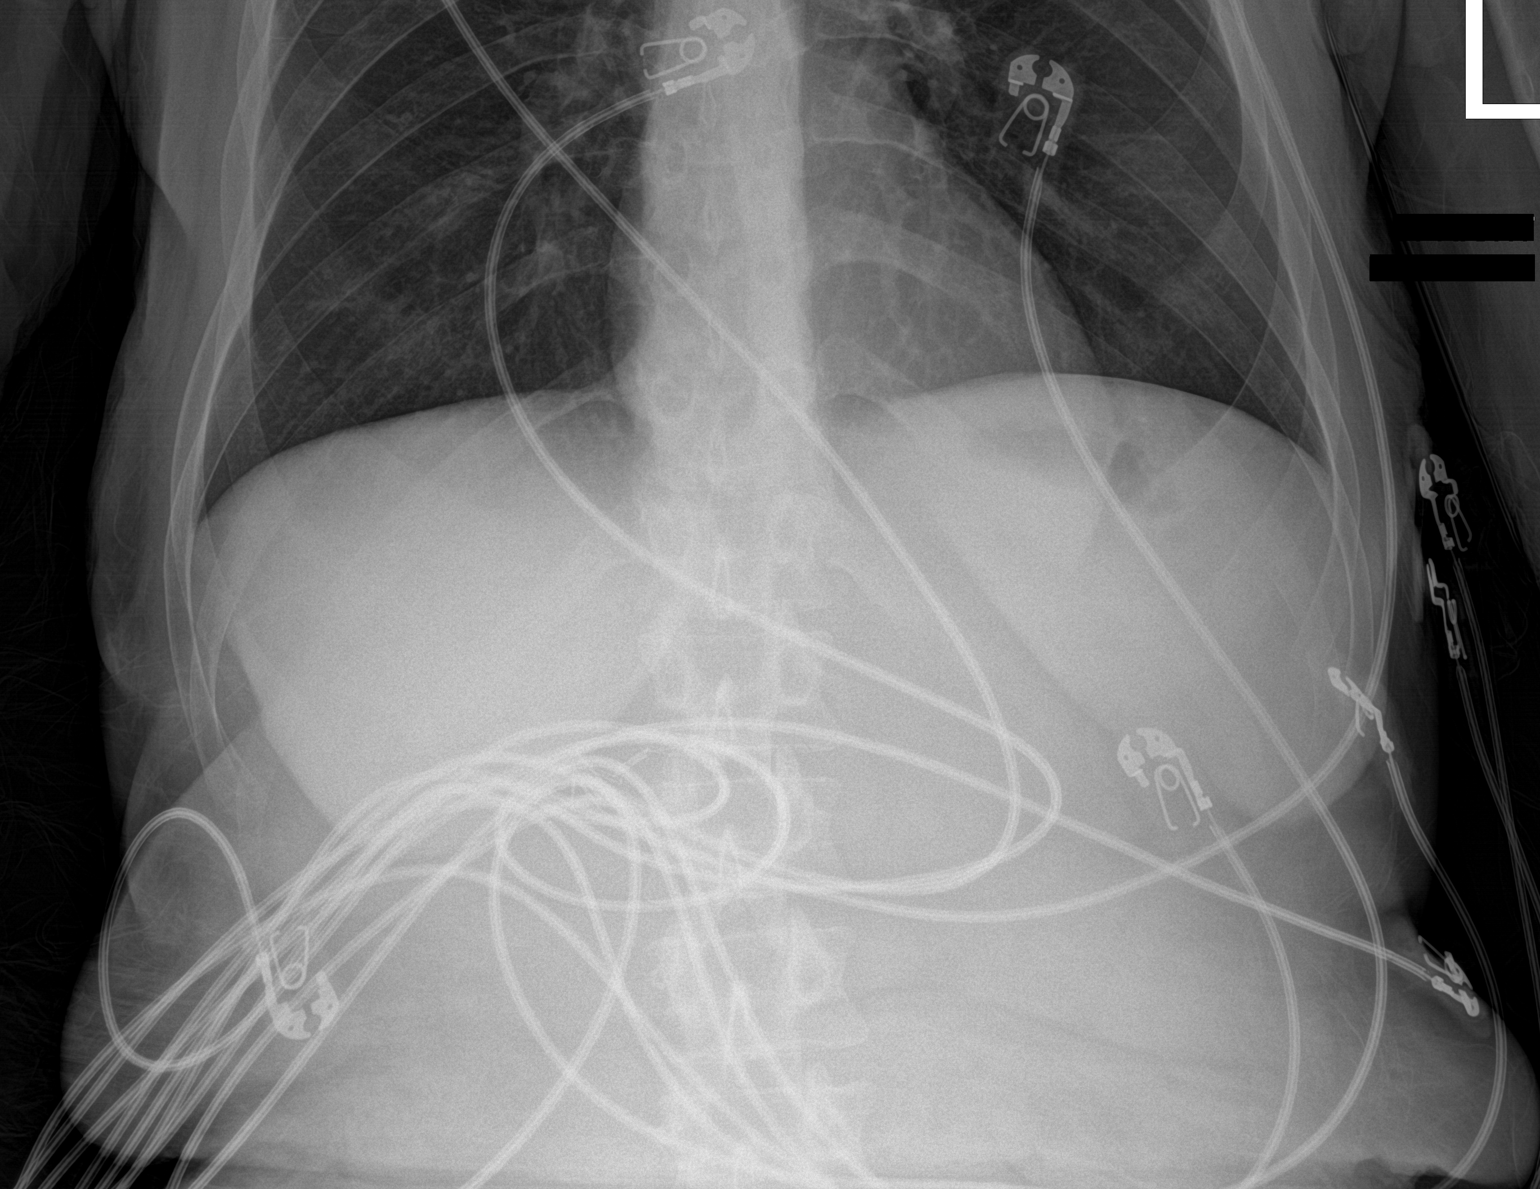

[3 of 3 positions shown; findings below may reference images not displayed]

FINDINGS: Chest:

Cardiomediastinal silhouette within normal limits. No pneumothorax
pleural effusion or confluent airspace disease.

Abdomen:

Paucity of gas within small bowel colon. No distention. No air-fluid
levels.

No unexpected radiopaque foreign body, soft tissue density, or
calcification.

No displaced fracture.
IMPRESSION: Chest:

No radiographic evidence of acute cardiopulmonary disease.

Abdomen:

Nonobstructive bowel gas pattern.

## 2019-05-28 MED ORDER — PROMETHAZINE HCL 25 MG RE SUPP: 25.0000 mg | Freq: Four times a day (QID) | RECTAL | 0 refills | Status: DC | PRN

## 2019-05-28 MED ORDER — LABETALOL HCL 5 MG/ML IV SOLN
20.0000 mg | Freq: Once | INTRAVENOUS | Status: AC
  Administered 2019-05-28: 20 mg via INTRAVENOUS
  Filled 2019-05-28: qty 4

## 2019-05-28 MED ORDER — PROMETHAZINE HCL 25 MG/ML IJ SOLN
25.0000 mg | Freq: Once | INTRAMUSCULAR | Status: AC
  Administered 2019-05-28: 25 mg via INTRAVENOUS
  Filled 2019-05-28: qty 1

## 2019-05-28 MED ORDER — INSULIN ASPART 100 UNIT/ML ~~LOC~~ SOLN
10.0000 [IU] | Freq: Once | SUBCUTANEOUS | Status: AC
  Administered 2019-05-28: 10 [IU] via SUBCUTANEOUS
  Filled 2019-05-28: qty 0.1

## 2019-05-28 MED ORDER — SODIUM CHLORIDE 0.9 % IV BOLUS
1000.0000 mL | Freq: Once | INTRAVENOUS | Status: AC
  Administered 2019-05-28: 1000 mL via INTRAVENOUS

## 2019-05-28 MED ORDER — DROPERIDOL 2.5 MG/ML IJ SOLN
2.5000 mg | Freq: Once | INTRAMUSCULAR | Status: AC
  Administered 2019-05-28: 2.5 mg via INTRAVENOUS
  Filled 2019-05-28: qty 2

## 2019-05-28 MED ORDER — HYDRALAZINE HCL 10 MG PO TABS: 50.0000 mg | ORAL_TABLET | Freq: Once | ORAL | Status: DC

## 2019-05-28 MED ORDER — POTASSIUM CHLORIDE 10 MEQ/100ML IV SOLN
10.0000 meq | INTRAVENOUS | Status: DC
  Administered 2019-05-28 (×2): 10 meq via INTRAVENOUS
  Filled 2019-05-28 (×2): qty 100

## 2019-05-28 MED ORDER — METOPROLOL TARTRATE 25 MG PO TABS
25.0000 mg | ORAL_TABLET | Freq: Once | ORAL | Status: AC
  Administered 2019-05-28: 25 mg via ORAL
  Filled 2019-05-28: qty 1

## 2019-05-28 NOTE — ED Triage Notes (Signed)
Pt reports continuing abdominal, back pain, nausea and vomiting. Seen yesterday for same.

## 2019-05-28 NOTE — ED Provider Notes (Signed)
[place] DEPT Provider Note   CSN: [id number] Arrival date & time: 05/28/19  1316     History Chief Complaint  Patient presents with  . Abdominal Pain  . Nausea  . Emesis  . Back Pain    [name] is a 29 y.o. female.  Patient with a history of diabetes, gastroparesis, cyclic vomiting syndrome, fibromyalgia, recent diagnosis of COVID-19 on February 20 here with 2 days of abdominal pain, back pain, nausea, and vomiting x 3 days. Complains of pain to epigastrium that radiates to midback. Constant. Associated with nausea and vomiting. No bilious and bloody. No blood in stool. No fever. Unable to keep medications down.  She has not seen her abdomen radiates to her back.  It is similar to previous episodes.  No black or bloody stools.  No chest pain or shortness of breath.  Previous cholecystectomy.  Pain and nausea are similar to previous presentations.  The history is provided by the patient. The history is limited by the condition of the patient.  Abdominal Pain Associated symptoms: diarrhea, nausea and vomiting   Associated symptoms: no chest pain, no cough, no dysuria, no fatigue, no fever, no hematuria and no shortness of breath   Emesis Associated symptoms: abdominal pain and diarrhea   Associated symptoms: no cough, no fever and no headaches   Back Pain Associated symptoms: abdominal pain   Associated symptoms: no chest pain, no dysuria, no fever, no headaches and no weakness        Past Medical History:  Diagnosis Date  . Allergy   . Anemia   . Anxiety   . Blood transfusion without reported diagnosis    Dec 2018  . Cataract    right eye  . COVID-19   . Depression   . Diabetes type 1, uncontrolled ([place]) 11/14/2011   Since age 55  . Fibromyalgia   . Gastroparesis   . GERD (gastroesophageal reflux disease)   . Hypertension   . Infection    UTI April 2016    Patient Active Problem List   Diagnosis Date Noted  . Nausea &  vomiting 04/30/2019  . Hypertensive urgency 04/30/2019  . Intractable nausea and vomiting 04/30/2019  . Contraception management 08/06/2018  . Cyclical vomiting syndrome 06/07/2018  . Diabetic gastroparesis associated with type 1 diabetes mellitus ([place]) 06/04/2018  . Anxiety 05/31/2018  . Peripheral edema 05/31/2018  . Normocytic anemia 07/10/2017  . GERD (gastroesophageal reflux disease) 06/23/2017  . MDD (major depressive disorder), recurrent episode, mild ([place]) 02/28/2017  . Diabetes mellitus type 1 ([place]) 04/28/2013  . Protein-calorie malnutrition, severe ([place]) 04/28/2013  . Hypertension associated with diabetes ([place]) 11/14/2011  . Chronic abdominal pain 09/18/2011    Past Surgical History:  Procedure Laterality Date  . ANKLE SURGERY    . CHOLECYSTECTOMY  11/15/2011   Procedure: LAPAROSCOPIC CHOLECYSTECTOMY WITH INTRAOPERATIVE CHOLANGIOGRAM;  Surgeon: [name], MD;  Location: [facility];  Service: General;  Laterality: N/A;  . COLONOSCOPY    . COLONOSCOPY WITH PROPOFOL N/A 06/27/2017   Procedure: COLONOSCOPY WITH PROPOFOL;  Surgeon: Milus Banister, MD;  Location: [facility];  Service: Endoscopy;  Laterality: N/A;  . ESOPHAGOGASTRODUODENOSCOPY  12/03/2011   Procedure: ESOPHAGOGASTRODUODENOSCOPY (EGD);  Surgeon: Beryle Beams, MD;  Location: [facility];  Service: Endoscopy;  Laterality: N/A;  . FLEXIBLE SIGMOIDOSCOPY N/A 03/10/2017   Procedure: FLEXIBLE SIGMOIDOSCOPY;  Surgeon: Carol Ada, MD;  Location: [facility];  Service: Endoscopy;  Laterality: N/A;  . INCISION AND DRAINAGE  PERIRECTAL ABSCESS N/A 03/01/2017   Procedure: IRRIGATION AND DEBRIDEMENT PERIRECTAL ABSCESS;  Surgeon: Alphonsa Overall, MD;  Location: [facility];  Service: General;  Laterality: N/A;  . IRRIGATION AND DEBRIDEMENT BUTTOCKS N/A 03/23/2017   Procedure: IRRIGATION AND DEBRIDEMENT BUTTOCKS, SETON PLACEMENT;  Surgeon: [name], MD;  Location: [facility];  Service: General;  Laterality: N/A;  . LAPAROSCOPY   11/23/2011   Procedure: LAPAROSCOPY DIAGNOSTIC;  Surgeon: Edward Jolly, MD;  Location: [facility];  Service: General;  Laterality: N/A;  . SIGMOIDOSCOPY    . UPPER GASTROINTESTINAL ENDOSCOPY    . WISDOM TOOTH EXTRACTION       OB History    Gravida  1   Para  1   Term      Preterm  1   AB      Living  1     SAB      TAB      Ectopic      Multiple  0   Live Births  1           Family History  Problem Relation Age of Onset  . Diabetes Mother   . Hypertension Father   . Colon cancer Paternal Grandmother        pt thinks PGM was dx in her 40's  . Diabetes Paternal Grandmother   . Diabetes Maternal Grandmother   . Diabetes Maternal Grandfather   . Diabetes Paternal Grandfather   . Diabetes Other   . Esophageal cancer Neg Hx   . Liver cancer Neg Hx   . Pancreatic cancer Neg Hx   . Stomach cancer Neg Hx   . Rectal cancer Neg Hx     Social History   Tobacco Use  . Smoking status: Never Smoker  . Smokeless tobacco: Never Used  Substance Use Topics  . Alcohol use: No    Alcohol/week: 0.0 standard drinks  . Drug use: Yes    Types: Marijuana    Home Medications Prior to Admission medications   Medication Sig Start Date End Date Taking? Authorizing Provider  Continuous Blood Gluc Receiver ([place]) [place] See admin instructions. USE AS DIRECTED TO MONITOR GLUCOSE LEVELS 10/29/18   [provider]  Continuous Blood Gluc Sensor (DEXCOM G6 SENSOR) MISC 1 each by Does not apply route See admin instructions. every 10 days. 12/31/18   Renato Shin, MD  Continuous Blood Gluc Transmit (DEXCOM G6 TRANSMITTER) MISC See admin instructions. USE AS DIRECTED TO MONITOR GLUCOSE LEVELS 10/29/18   [provider]  cyclobenzaprine (FLEXERIL) 10 MG tablet TAKE 1 TABLET(10 MG) BY MOUTH THREE TIMES DAILY AS NEEDED FOR MUSCLE SPASMS Patient taking differently: Take 10 mg by mouth 3 (three) times daily as needed for muscle spasms.  04/29/19   [name], MD  dicyclomine (BENTYL) 20 MG tablet Take 1 tablet (20 mg total) by mouth 3 (three) times daily as needed for spasms. 05/01/19   [name], MD  furosemide (LASIX) 40 MG tablet TAKE 1 TABLET BY MOUTH  DAILY Patient taking differently: Take 40 mg by mouth daily.  11/11/18   Armbruster, Carlota Raspberry, MD  hydrALAZINE (APRESOLINE) 50 MG tablet Take 1 tablet (50 mg total) by mouth every 6 (six) hours. Patient taking differently: Take 50 mg by mouth 2 (two) times daily.  10/23/18   Swayze, Ava, DO  ibuprofen (ADVIL) 200 MG tablet Take 400 mg by mouth every 6 (six) hours as needed for moderate pain.    [provider]  Insulin Glargine (LANTUS SOLOSTAR) 100 UNIT/ML Solostar Pen Inject 10 Units into the skin at bedtime. And pen needles 4/day Patient taking differently: Inject 12 Units into the skin at bedtime. And pen needles 4/day 12/31/18   Renato Shin, MD  insulin lispro (HUMALOG KWIKPEN) 100 UNIT/ML KwikPen Inject 0.06 mLs (6 Units total) into the skin 3 (three) times daily. Patient taking differently: Inject 1-9 Units into the skin 3 (three) times daily. Per sliding scale (not provided) 12/31/18   Renato Shin, MD  loperamide (IMODIUM) 2 MG capsule Take 6 mg by mouth as needed for diarrhea or loose stools.    [provider]  LORazepam (ATIVAN) 0.5 MG tablet Take 1 tablet (0.5 mg total) by mouth 2 (two) times daily as needed for anxiety. 04/05/19   [name], MD  metoprolol tartrate (LOPRESSOR) 25 MG tablet Take 1 tablet (25 mg total) by mouth 2 (two) times daily. 05/22/19 06/21/19  Arrien, Jimmy Picket, MD  mirtazapine (REMERON) 30 MG tablet TAKE 1 TABLET BY MOUTH AT  BEDTIME Patient taking differently: Take 30 mg by mouth at bedtime.  04/19/19   [name], MD  ondansetron (ZOFRAN) 4 MG tablet Take 1 tablet (4 mg total) by mouth every 8 (eight) hours as needed for nausea or vomiting. 05/22/19   Arrien, Jimmy Picket, MD  OVER THE COUNTER MEDICATION Take 2  tablets by mouth daily. 2 chewable gummies called Flo for period    [provider]  pantoprazole (PROTONIX) 40 MG tablet TAKE 1 TABLET BY MOUTH  DAILY Patient taking differently: Take 40 mg by mouth daily.  10/22/18   Armbruster, Carlota Raspberry, MD  metoCLOPramide (REGLAN) 10 MG tablet Take 1 tablet (10 mg total) by mouth every 6 (six) hours as needed for nausea or vomiting. Patient not taking: Reported on 04/23/2019 02/20/18 04/23/19  Shelly Coss, MD  potassium chloride 20 MEQ TBCR Take 20 mEq by mouth 2 (two) times daily. Patient not taking: Reported on 03/31/2019 10/17/18 04/23/19  Isla Pence, MD  potassium chloride SA (KLOR-CON) 20 MEQ tablet Take 1 tablet (20 mEq total) by mouth daily. Patient not taking: Reported on 03/31/2019 03/14/19 04/23/19  Drenda Freeze, MD    Allergies    Other and Lactose intolerance (gi)  Review of Systems   Review of Systems  Constitutional: Negative for activity change, appetite change, fatigue and fever.  HENT: Negative for congestion and rhinorrhea.   Eyes: Negative for visual disturbance.  Respiratory: Negative for cough and shortness of breath.   Cardiovascular: Negative for chest pain.  Gastrointestinal: Positive for abdominal pain, diarrhea, nausea and vomiting.  Genitourinary: Negative for dysuria and hematuria.  Musculoskeletal: Positive for back pain.  Skin: Negative for rash.  Neurological: Negative for weakness and headaches.   all other systems are negative except as noted in the HPI and PMH.    Physical Exam Updated Vital Signs BP (!) 169/118   Pulse (!) 104   Temp 97.7 F (36.5 C)   Resp 20   LMP 04/30/2019   SpO2 100%   Physical Exam Vitals and nursing note reviewed.  Constitutional:      General: She is not in acute distress.    Appearance: She is well-developed. She is ill-appearing.     Comments: Chronically ill-appearing, appears older than stated age.  HENT:     Head: Normocephalic and atraumatic.      Mouth/Throat:     Mouth: Mucous membranes are dry.     Pharynx: No oropharyngeal  exudate.     Comments: Edentulous, dry mucous membranes Eyes:     Conjunctiva/sclera: Conjunctivae normal.     Pupils: Pupils are equal, round, and reactive to light.  Neck:     Comments: No meningismus. Cardiovascular:     Rate and Rhythm: Normal rate and regular rhythm.     Heart sounds: Normal heart sounds. No murmur.  Pulmonary:     Effort: Pulmonary effort is normal. No respiratory distress.     Breath sounds: Normal breath sounds.  Abdominal:     Palpations: Abdomen is soft.     Tenderness: There is abdominal tenderness. There is no guarding or rebound.     Comments: Epigastric tenderness, abdomen soft  Musculoskeletal:        General: Tenderness present. Normal range of motion.     Cervical back: Normal range of motion and neck supple.     Comments: Paraspinal thoracic back tenderness  Skin:    General: Skin is warm.  Neurological:     Mental Status: She is alert and oriented to person, place, and time.     Cranial Nerves: No cranial nerve deficit.     Motor: No abnormal muscle tone.     Coordination: Coordination normal.     Comments: No ataxia on finger to nose bilaterally. No pronator drift. 5/5 strength throughout. CN 2-12 intact.Equal grip strength. Sensation intact.   Psychiatric:        Behavior: Behavior normal.     ED Results / Procedures / Treatments   Labs (all labs ordered are listed, but only abnormal results are displayed) Labs Reviewed  URINALYSIS, ROUTINE W REFLEX MICROSCOPIC - Abnormal; Notable for the following components:      Result Value   Glucose, UA >=500 (*)    Hgb urine dipstick SMALL (*)    Ketones, ur 5 (*)    Protein, ur 100 (*)    All other components within normal limits  CBC WITH DIFFERENTIAL/PLATELET - Abnormal; Notable for the following components:   WBC 3.7 (*)    RBC 3.63 (*)    Hemoglobin 10.1 (*)    HCT 30.5 (*)    All other components within  normal limits  COMPREHENSIVE METABOLIC PANEL - Abnormal; Notable for the following components:   Sodium 134 (*)    Potassium 3.0 (*)    Chloride 91 (*)    Glucose, Bld 498 (*)    Creatinine, Ser 1.24 (*)    Calcium 8.2 (*)    ALT 45 (*)    GFR calc non Af Amer 59 (*)    All other components within normal limits  BLOOD GAS, VENOUS - Abnormal; Notable for the following components:   pO2, Ven <31.0 (*)    Bicarbonate 31.0 (*)    Acid-Base Excess 5.2 (*)    All other components within normal limits  CBG MONITORING, ED - Abnormal; Notable for the following components:   Glucose-Capillary 452 (*)    All other components within normal limits  CBG MONITORING, ED - Abnormal; Notable for the following components:   Glucose-Capillary 340 (*)    All other components within normal limits  CBG MONITORING, ED - Abnormal; Notable for the following components:   Glucose-Capillary 188 (*)    All other components within normal limits  LACTIC ACID, PLASMA  LIPASE, BLOOD  HCG, QUANTITATIVE, PREGNANCY    EKG EKG Interpretation  Date/Time:  Saturday May 28 2019 13:48:51 EST Ventricular Rate:  99 PR Interval:    QRS  Duration: 82 QT Interval:  351 QTC Calculation: 451 R Axis:   83 Text Interpretation: Sinus rhythm Probable left atrial enlargement ST elev, probable normal early repol pattern No significant change was found Confirmed by Ezequiel Essex [phone]) on 05/28/2019 1:58:18 PM   Radiology DG ABD ACUTE 2+V W 1V CHEST  Result Date: 05/28/2019 CLINICAL DATA:  29 year old female with abdominal pain and vomiting EXAM: DG ABDOMEN ACUTE W/ 1V CHEST COMPARISON:  03/14/2019 FINDINGS: Chest: Cardiomediastinal silhouette within normal limits. No pneumothorax pleural effusion or confluent airspace disease. Abdomen: Paucity of gas within small bowel colon. No distention. No air-fluid levels. No unexpected radiopaque foreign body, soft tissue density, or calcification. No displaced fracture.  IMPRESSION: Chest: No radiographic evidence of acute cardiopulmonary disease. Abdomen: Nonobstructive bowel gas pattern. Electronically Signed   By: Corrie Mckusick D.O.   On: 05/28/2019 15:14    Procedures Procedures (including critical care time)  Medications Ordered in ED Medications  sodium chloride 0.9 % bolus 1,000 mL (has no administration in time range)  promethazine (PHENERGAN) injection 25 mg (has no administration in time range)    ED Course  I have reviewed the triage vital signs and the nursing notes.  Pertinent labs & imaging results that were available during my care of the patient were reviewed by me and considered in my medical decision making (see chart for details).    MDM Rules/Calculators/A&P                      Abdominal pain with nausea and vomiting similar to previous episodes of gastroparesis and cyclic vomiting syndrome.  Patient given IV hydration and symptom control.  She has hyperglycemia without evidence of DKA.  Hyperglycemia without DKA.  Hypokalemia is repleted.  Patient given symptom control.  Abdominal series is negative.  Patient with good relief of her symptoms with nausea medications in the ED.  No vomiting.  She is tolerating p.o.  Blood sugar is improved to 320.  She states she has her medications at home but is requesting Phenergan suppositories which were provided.  Blood pressure has improved with receiving her home medications.  Blood sugar has improved.  She is requesting discharge home. Return precautions discussed Final Clinical Impression(s) / ED Diagnoses Final diagnoses:  Non-intractable vomiting with nausea, unspecified vomiting type  Hyperglycemia    Rx / DC Orders ED Discharge Orders    None       Loza Prell, Annie Main, MD 05/28/19 2130

## 2019-05-28 NOTE — Discharge Instructions (Signed)
Take your pain and nausea medications as prescribed.  Take your insulin as prescribed.  Follow-up with your doctor.  Return to the ED if worsening pain, continued vomiting, any other concerns.

## 2019-05-29 ENCOUNTER — Emergency Department (HOSPITAL_COMMUNITY)
Admission: EM | Admit: 2019-05-29 | Discharge: 2019-05-29 | Disposition: A | Discharge status: Home / Self Care | Payer: 59 | Source: Home / Self Care | Attending: Emergency Medicine | Admitting: Emergency Medicine

## 2019-05-29 ENCOUNTER — Other Ambulatory Visit: Payer: Self-pay

## 2019-05-29 ENCOUNTER — Encounter (HOSPITAL_COMMUNITY): Payer: Self-pay | Admitting: Emergency Medicine

## 2019-05-29 DIAGNOSIS — Z79899 Other long term (current) drug therapy: Secondary | ICD-10-CM | POA: Insufficient documentation

## 2019-05-29 DIAGNOSIS — R1084 Generalized abdominal pain: Secondary | ICD-10-CM | POA: Insufficient documentation

## 2019-05-29 DIAGNOSIS — R112 Nausea with vomiting, unspecified: Secondary | ICD-10-CM | POA: Diagnosis not present

## 2019-05-29 DIAGNOSIS — U071 COVID-19: Secondary | ICD-10-CM | POA: Insufficient documentation

## 2019-05-29 DIAGNOSIS — R10812 Left upper quadrant abdominal tenderness: Secondary | ICD-10-CM | POA: Insufficient documentation

## 2019-05-29 DIAGNOSIS — G8929 Other chronic pain: Secondary | ICD-10-CM

## 2019-05-29 DIAGNOSIS — I10 Essential (primary) hypertension: Secondary | ICD-10-CM | POA: Insufficient documentation

## 2019-05-29 DIAGNOSIS — R10816 Epigastric abdominal tenderness: Secondary | ICD-10-CM | POA: Insufficient documentation

## 2019-05-29 DIAGNOSIS — R197 Diarrhea, unspecified: Secondary | ICD-10-CM | POA: Insufficient documentation

## 2019-05-29 DIAGNOSIS — E109 Type 1 diabetes mellitus without complications: Secondary | ICD-10-CM | POA: Insufficient documentation

## 2019-05-29 LAB — URINALYSIS, ROUTINE W REFLEX MICROSCOPIC
Bacteria, UA: NONE SEEN
Bilirubin Urine: NEGATIVE
Glucose, UA: >=500 mg/dL — AB
Ketones, ur: 20 mg/dL — AB
Leukocytes,Ua: NEGATIVE
Nitrite: NEGATIVE
Protein, ur: >=300 mg/dL — AB
Specific Gravity, Urine: 1.015 (ref 1.005–1.030)
pH: 6 (ref 5.0–8.0)

## 2019-05-29 LAB — COMPREHENSIVE METABOLIC PANEL
ALT: 36 U/L (ref 0–44)
AST: 33 U/L (ref 15–41)
Albumin: 2.9 g/dL — ABNORMAL LOW (ref 3.5–5.0)
Alkaline Phosphatase: 68 U/L (ref 38–126)
Anion gap: 15 (ref 5–15)
BUN: 17 mg/dL (ref 6–20)
CO2: 25 mmol/L (ref 22–32)
Calcium: 7.9 mg/dL — ABNORMAL LOW (ref 8.9–10.3)
Chloride: 95 mmol/L — ABNORMAL LOW (ref 98–111)
Creatinine, Ser: 0.92 mg/dL (ref 0.44–1.00)
GFR calc Af Amer: >60 mL/min (ref >60)
GFR calc non Af Amer: >60 mL/min (ref >60)
Glucose, Bld: 346 mg/dL — ABNORMAL HIGH (ref 70–99)
Potassium: 3.1 mmol/L — ABNORMAL LOW (ref 3.5–5.1)
Sodium: 135 mmol/L (ref 135–145)
Total Bilirubin: 0.7 mg/dL (ref 0.3–1.2)
Total Protein: 6.5 g/dL (ref 6.5–8.1)

## 2019-05-29 LAB — CBC
HCT: 27.9 % — ABNORMAL LOW (ref 36.0–46.0)
Hemoglobin: 9.3 g/dL — ABNORMAL LOW (ref 12.0–15.0)
MCH: 27.8 pg (ref 26.0–34.0)
MCHC: 33.3 g/dL (ref 30.0–36.0)
MCV: 83.5 fL (ref 80.0–100.0)
Platelets: 214 10*3/uL (ref 150–400)
RBC: 3.34 MIL/uL — ABNORMAL LOW (ref 3.87–5.11)
RDW: 13.1 % (ref 11.5–15.5)
WBC: 3.3 10*3/uL — ABNORMAL LOW (ref 4.0–10.5)
nRBC: 0 % (ref 0.0–0.2)

## 2019-05-29 LAB — LIPASE, BLOOD: Lipase: 17 U/L (ref 11–51)

## 2019-05-29 LAB — I-STAT BETA HCG BLOOD, ED (MC, WL, AP ONLY): I-stat hCG, quantitative: <5 m[IU]/mL (ref <5)

## 2019-05-29 MED ORDER — DROPERIDOL 2.5 MG/ML IJ SOLN
2.5000 mg | Freq: Once | INTRAMUSCULAR | Status: AC
  Administered 2019-05-29: 2.5 mg via INTRAVENOUS
  Filled 2019-05-29: qty 2

## 2019-05-29 MED ORDER — SODIUM CHLORIDE 0.9% FLUSH: 3.0000 mL | Freq: Once | INTRAVENOUS | Status: DC

## 2019-05-29 MED ORDER — HYOSCYAMINE SULFATE 0.125 MG SL SUBL
0.2500 mg | SUBLINGUAL_TABLET | Freq: Once | SUBLINGUAL | Status: AC
  Administered 2019-05-29: 0.25 mg via SUBLINGUAL
  Filled 2019-05-29: qty 2

## 2019-05-29 MED ORDER — ALUM & MAG HYDROXIDE-SIMETH 200-200-20 MG/5ML PO SUSP
30.0000 mL | Freq: Once | ORAL | Status: AC
  Administered 2019-05-29: 30 mL via ORAL
  Filled 2019-05-29: qty 30

## 2019-05-29 MED ORDER — SODIUM CHLORIDE 0.9 % IV BOLUS (SEPSIS)
1000.0000 mL | Freq: Once | INTRAVENOUS | Status: AC
  Administered 2019-05-29: 1000 mL via INTRAVENOUS

## 2019-05-29 MED ORDER — SODIUM CHLORIDE 0.9 % IV SOLN: 1000.0000 mL | INTRAVENOUS | Status: DC

## 2019-05-29 MED ORDER — PROMETHAZINE HCL 25 MG RE SUPP: 25.0000 mg | Freq: Four times a day (QID) | RECTAL | 0 refills | Status: DC | PRN

## 2019-05-29 NOTE — ED Notes (Signed)
Pt came to the ED per triage complaint. Pt conscious, breathing, and A&Ox4. Pt brought back to [facility] via wheelchair. Pt endorses "I've been to the hospital the past seven days for vomiting and abdominal pain". Chest rise and fall equally with non-labored breathing. Lungs clear apex to base. Abd soft and non-tender. Pt denies chest pain, n/v/d, shortness of breath, and f/c. Pt endorses pain 5 out of 10 pain that's cramp like. PIVC placed on the LAC with a 20G which had positive blood return and flushed without pain or infiltration. Blood collected, labeled, and sent to lab. Bed in lowest position with call light within reach. Pt on continuous blood pressure, pulse ox, and cardiac monitor. Will continue to monitor. Awaiting MD eval. No distress noted.

## 2019-05-29 NOTE — ED Triage Notes (Signed)
Patient reports persistent emesis with generalized abdominal pain and diarrhea onset this week , patient stated positive Covid test last week , no fever or chills .

## 2019-05-29 NOTE — ED Notes (Signed)
Pt was discharged from the ED. Pt read and understood discharge paperwork. Pt had vital signs completed. Pt conscious, breathing, and A&Ox4. No distress noted. Pt speaking in complete sentences. Pt ambulated out of the ED with a smooth and steady gait. E-signature not available.

## 2019-05-29 NOTE — ED Provider Notes (Signed)
[facility] EMERGENCY DEPARTMENT Provider Note  CSN: [id number] Arrival date & time: 05/29/19 0059  Chief Complaint(s) Covid+ / Emesis  HPI [name] is a 29 y.o. female with extensive past medical history listed below including type 1 diabetes, gastroparesis with chronic abdominal pain who was recently diagnosed with COVID-19 1 week ago, presents to the emergency department with exacerbation of her chronic abdominal pain and gastroparesis.  Patient is reporting nausea and nonbloody nonbilious emesis as well as diarrhea.  Patient was seen several times in the last several days for this.  Had reassuring work-ups and treated symptomatically.  No acute changes.  Patient just states that whenever she gets home, the pain returns and she is not able to keep things down.  She states that she has oral Phenergan which she is throwing up. Patient is endorsing intermittent fevers for the past week.  HPI  Past Medical History Past Medical History:  Diagnosis Date   Allergy    Anemia    Anxiety    Blood transfusion without reported diagnosis    Dec 2018   Cataract    right eye   COVID-19    Depression    Diabetes type 1, uncontrolled ([place]) 11/14/2011   Since age 85   Fibromyalgia    Gastroparesis    GERD (gastroesophageal reflux disease)    Hypertension    Infection    UTI April 2016   Patient Active Problem List   Diagnosis Date Noted   Nausea & vomiting 04/30/2019   Hypertensive urgency 04/30/2019   Intractable nausea and vomiting 04/30/2019   Contraception management 47/11/6281   Cyclical vomiting syndrome 06/07/2018   Diabetic gastroparesis associated with type 1 diabetes mellitus ([place]) 06/04/2018   Anxiety 05/31/2018   Peripheral edema 05/31/2018   Normocytic anemia 07/10/2017   GERD (gastroesophageal reflux disease) 06/23/2017   MDD (major depressive disorder), recurrent episode, mild ([place]) 02/28/2017   Diabetes mellitus type 1  ([place]) 04/28/2013   Protein-calorie malnutrition, severe ([place]) 04/28/2013   Hypertension associated with diabetes ([place]) 11/14/2011   Chronic abdominal pain 09/18/2011   Home Medication(s) Prior to Admission medications   Medication Sig Start Date End Date Taking? Authorizing Provider  Continuous Blood Gluc Receiver ([place]) [place] See admin instructions. USE AS DIRECTED TO MONITOR GLUCOSE LEVELS 10/29/18   [provider]  Continuous Blood Gluc Sensor (DEXCOM G6 SENSOR) MISC 1 each by Does not apply route See admin instructions. every 10 days. 12/31/18   Renato Shin, MD  Continuous Blood Gluc Transmit (DEXCOM G6 TRANSMITTER) MISC See admin instructions. USE AS DIRECTED TO MONITOR GLUCOSE LEVELS 10/29/18   [provider]  cyclobenzaprine (FLEXERIL) 10 MG tablet TAKE 1 TABLET(10 MG) BY MOUTH THREE TIMES DAILY AS NEEDED FOR MUSCLE SPASMS Patient taking differently: Take 10 mg by mouth 3 (three) times daily as needed for muscle spasms.  04/29/19   [name], MD  dicyclomine (BENTYL) 20 MG tablet Take 1 tablet (20 mg total) by mouth 3 (three) times daily as needed for spasms. 05/01/19   [name], MD  furosemide (LASIX) 40 MG tablet TAKE 1 TABLET BY MOUTH  DAILY Patient taking differently: Take 40 mg by mouth daily.  11/11/18   Armbruster, Carlota Raspberry, MD  hydrALAZINE (APRESOLINE) 50 MG tablet Take 1 tablet (50 mg total) by mouth every 6 (six) hours. Patient taking differently: Take 50 mg by mouth 2 (two) times daily.  10/23/18   Swayze, Ava, DO  ibuprofen (ADVIL) 200  MG tablet Take 400 mg by mouth every 6 (six) hours as needed for moderate pain.    [provider]  Insulin Glargine (LANTUS SOLOSTAR) 100 UNIT/ML Solostar Pen Inject 10 Units into the skin at bedtime. And pen needles 4/day Patient taking differently: Inject 12 Units into the skin at bedtime. And pen needles 4/day 12/31/18   Renato Shin, MD  insulin lispro (HUMALOG KWIKPEN) 100 UNIT/ML  KwikPen Inject 0.06 mLs (6 Units total) into the skin 3 (three) times daily. Patient taking differently: Inject 1-9 Units into the skin 3 (three) times daily. Per sliding scale 12/31/18   Renato Shin, MD  loperamide (IMODIUM) 2 MG capsule Take 6 mg by mouth as needed for diarrhea or loose stools.    [provider]  LORazepam (ATIVAN) 0.5 MG tablet Take 1 tablet (0.5 mg total) by mouth 2 (two) times daily as needed for anxiety. 04/05/19   [name], MD  metoprolol tartrate (LOPRESSOR) 25 MG tablet Take 1 tablet (25 mg total) by mouth 2 (two) times daily. 05/22/19 06/21/19  Arrien, Jimmy Picket, MD  mirtazapine (REMERON) 30 MG tablet TAKE 1 TABLET BY MOUTH AT  BEDTIME Patient taking differently: Take 30 mg by mouth at bedtime.  04/19/19   [name], MD  ondansetron (ZOFRAN) 4 MG tablet Take 1 tablet (4 mg total) by mouth every 8 (eight) hours as needed for nausea or vomiting. 05/22/19   Arrien, Jimmy Picket, MD  OVER THE COUNTER MEDICATION Take 2 each by mouth daily. Flo Chewable Gummies for period    [provider]  pantoprazole (PROTONIX) 40 MG tablet TAKE 1 TABLET BY MOUTH  DAILY Patient taking differently: Take 40 mg by mouth daily.  10/22/18   Armbruster, Carlota Raspberry, MD  promethazine (PHENERGAN) 25 MG suppository Place 1 suppository (25 mg total) rectally every 6 (six) hours as needed for nausea or vomiting. 05/28/19   Rancour, Annie Main, MD  promethazine (PHENERGAN) 25 MG suppository Place 1 suppository (25 mg total) rectally every 6 (six) hours as needed for nausea or vomiting. 05/29/19   Labarron Durnin, Grayce Sessions, MD  metoCLOPramide (REGLAN) 10 MG tablet Take 1 tablet (10 mg total) by mouth every 6 (six) hours as needed for nausea or vomiting. Patient not taking: Reported on 04/23/2019 02/20/18 04/23/19  Shelly Coss, MD  potassium chloride 20 MEQ TBCR Take 20 mEq by mouth 2 (two) times daily. Patient not taking: Reported on 03/31/2019 10/17/18 04/23/19  Isla Pence,  MD  potassium chloride SA (KLOR-CON) 20 MEQ tablet Take 1 tablet (20 mEq total) by mouth daily. Patient not taking: Reported on 03/31/2019 03/14/19 04/23/19  Drenda Freeze, MD                                                                                                                                    Past Surgical History Past Surgical History:  Procedure Laterality Date   ANKLE SURGERY  CHOLECYSTECTOMY  11/15/2011   Procedure: LAPAROSCOPIC CHOLECYSTECTOMY WITH INTRAOPERATIVE CHOLANGIOGRAM;  Surgeon: [name], MD;  Location: [facility];  Service: General;  Laterality: N/A;   COLONOSCOPY     COLONOSCOPY WITH PROPOFOL N/A 06/27/2017   Procedure: COLONOSCOPY WITH PROPOFOL;  Surgeon: Milus Banister, MD;  Location: [facility];  Service: Endoscopy;  Laterality: N/A;   ESOPHAGOGASTRODUODENOSCOPY  12/03/2011   Procedure: ESOPHAGOGASTRODUODENOSCOPY (EGD);  Surgeon: Beryle Beams, MD;  Location: [facility];  Service: Endoscopy;  Laterality: N/A;   FLEXIBLE SIGMOIDOSCOPY N/A 03/10/2017   Procedure: FLEXIBLE SIGMOIDOSCOPY;  Surgeon: Carol Ada, MD;  Location: [facility];  Service: Endoscopy;  Laterality: N/A;   INCISION AND DRAINAGE PERIRECTAL ABSCESS N/A 03/01/2017   Procedure: IRRIGATION AND DEBRIDEMENT PERIRECTAL ABSCESS;  Surgeon: Alphonsa Overall, MD;  Location: [facility];  Service: General;  Laterality: N/A;   IRRIGATION AND DEBRIDEMENT BUTTOCKS N/A 03/23/2017   Procedure: IRRIGATION AND DEBRIDEMENT BUTTOCKS, SETON PLACEMENT;  Surgeon: [name], MD;  Location: [facility];  Service: General;  Laterality: N/A;   LAPAROSCOPY  11/23/2011   Procedure: LAPAROSCOPY DIAGNOSTIC;  Surgeon: Edward Jolly, MD;  Location: [facility];  Service: General;  Laterality: N/A;   SIGMOIDOSCOPY     UPPER GASTROINTESTINAL ENDOSCOPY     WISDOM TOOTH EXTRACTION     Family History Family History  Problem Relation Age of Onset   Diabetes Mother    Hypertension Father    Colon cancer  Paternal Grandmother        pt thinks PGM was dx in her 70's   Diabetes Paternal Grandmother    Diabetes Maternal Grandmother    Diabetes Maternal Grandfather    Diabetes Paternal Grandfather    Diabetes Other    Esophageal cancer Neg Hx    Liver cancer Neg Hx    Pancreatic cancer Neg Hx    Stomach cancer Neg Hx    Rectal cancer Neg Hx     Social History Social History   Tobacco Use   Smoking status: Never Smoker   Smokeless tobacco: Never Used  Substance Use Topics   Alcohol use: No    Alcohol/week: 0.0 standard drinks   Drug use: Yes    Types: Marijuana   Allergies Other and Lactose intolerance (gi)  Review of Systems Review of Systems All other systems are reviewed and are negative for acute change except as noted in the HPI  Physical Exam Vital Signs  I have reviewed the triage vital signs BP (!) 178/120 (BP Location: Right Arm)    Pulse (!) 101    Temp 98.1 F (36.7 C) (Oral)    Resp 18    Ht 5' 7"  (1.702 m)    Wt 65 kg    LMP 04/30/2019    SpO2 100%    BMI 22.44 kg/m   Physical Exam Vitals reviewed.  Constitutional:      General: She is not in acute distress.    Appearance: She is well-developed. She is not diaphoretic.  HENT:     Head: Normocephalic and atraumatic.     Right Ear: External ear normal.     Left Ear: External ear normal.     Nose: Nose normal.  Eyes:     General: No scleral icterus.    Conjunctiva/sclera: Conjunctivae normal.  Neck:     Trachea: Phonation normal.  Cardiovascular:     Rate and Rhythm: Normal rate and regular rhythm.  Pulmonary:     Effort: Pulmonary effort is normal. No respiratory distress.  Breath sounds: No stridor.  Abdominal:     General: There is no distension.     Tenderness: There is abdominal tenderness in the epigastric area and left upper quadrant.  Musculoskeletal:        General: Normal range of motion.     Cervical back: Normal range of motion.  Neurological:     Mental Status: She  is alert and oriented to person, place, and time.  Psychiatric:        Behavior: Behavior normal.     ED Results and Treatments Labs (all labs ordered are listed, but only abnormal results are displayed) Labs Reviewed  COMPREHENSIVE METABOLIC PANEL - Abnormal; Notable for the following components:      Result Value   Potassium 3.1 (*)    Chloride 95 (*)    Glucose, Bld 346 (*)    Calcium 7.9 (*)    Albumin 2.9 (*)    All other components within normal limits  CBC - Abnormal; Notable for the following components:   WBC 3.3 (*)    RBC 3.34 (*)    Hemoglobin 9.3 (*)    HCT 27.9 (*)    All other components within normal limits  URINALYSIS, ROUTINE W REFLEX MICROSCOPIC - Abnormal; Notable for the following components:   Glucose, UA >=500 (*)    Hgb urine dipstick SMALL (*)    Ketones, ur 20 (*)    Protein, ur >=300 (*)    All other components within normal limits  LIPASE, BLOOD  I-STAT BETA HCG BLOOD, ED ([facility], [facility], [facility] ONLY)                                                                                                                         EKG  EKG Interpretation  Date/Time:    Ventricular Rate:    PR Interval:    QRS Duration:   QT Interval:    QTC Calculation:   R Axis:     Text Interpretation:        Radiology DG ABD ACUTE 2+V W 1V CHEST  Result Date: 05/28/2019 CLINICAL DATA:  29 year old female with abdominal pain and vomiting EXAM: DG ABDOMEN ACUTE W/ 1V CHEST COMPARISON:  03/14/2019 FINDINGS: Chest: Cardiomediastinal silhouette within normal limits. No pneumothorax pleural effusion or confluent airspace disease. Abdomen: Paucity of gas within small bowel colon. No distention. No air-fluid levels. No unexpected radiopaque foreign body, soft tissue density, or calcification. No displaced fracture. IMPRESSION: Chest: No radiographic evidence of acute cardiopulmonary disease. Abdomen: Nonobstructive bowel gas pattern. Electronically Signed   By: Corrie Mckusick D.O.    On: 05/28/2019 15:14    Pertinent labs & imaging results that were available during my care of the patient were reviewed by me and considered in my medical decision making (see chart for details).  Medications Ordered in ED Medications  sodium chloride flush (NS) 0.9 % injection 3 mL (has no administration in time range)  sodium chloride 0.9 % bolus 1,000  mL (0 mLs Intravenous Stopped 05/29/19 0414)    Followed by  0.9 %  sodium chloride infusion (has no administration in time range)  droperidol (INAPSINE) 2.5 MG/ML injection 2.5 mg (2.5 mg Intravenous Given 05/29/19 0403)  alum & mag hydroxide-simeth (MAALOX/MYLANTA) 200-200-20 MG/5ML suspension 30 mL (30 mLs Oral Given 05/29/19 0340)  hyoscyamine (LEVSIN SL) SL tablet 0.25 mg (0.25 mg Sublingual Given 05/29/19 0340)                                                                                                                                    Procedures Procedures  (including critical care time)  Medical Decision Making / ED Course I have reviewed the nursing notes for this encounter and the patient's prior records (if available in EHR or on provided paperwork).   ANAELI CORNWALL was evaluated in Emergency Department on 05/29/2019 for the symptoms described in the history of present illness. She was evaluated in the context of the global COVID-19 pandemic, which necessitated consideration that the patient might be at risk for infection with the SARS-CoV-2 virus that causes COVID-19. Institutional protocols and algorithms that pertain to the evaluation of patients at risk for COVID-19 are in a state of rapid change based on information released by regulatory bodies including the CDC and federal and state organizations. These policies and algorithms were followed during the patient's care in the ED.  Patient has mild epigastric and left upper quadrant discomfort to palpation.  No evidence of peritonitis.  Labs grossly reassuring without  leukocytosis or significant anemia.  No evidence of significant electrolyte derangements or renal sufficiency.  Patient does have hyperglycemia without evidence of DKA.  UA not suspicious for urinary tract infection.  Patient treated symptomatically with droperidol, GI cocktail and IV fluids.  Patient was able to tolerate oral intake and had improved discomfort.  Patient is in no acute respiratory distress satting well on room air.  She is afebrile with stable vital signs.  With regards to her COVID-19 diagnosis she appears to be appropriate for outpatient management.  Prescribed her suppository Phenergan.      Final Clinical Impression(s) / ED Diagnoses Final diagnoses:  Chronic abdominal pain    The patient appears reasonably screened and/or stabilized for discharge and I doubt any other medical condition or other [facility] requiring further screening, evaluation, or treatment in the ED at this time prior to discharge. Safe for discharge with strict return precautions.  Disposition: Discharge  Condition: Good  I have discussed the results, Dx and Tx plan with the patient/family who expressed understanding and agree(s) with the plan. Discharge instructions discussed at length. The patient/family was given strict return precautions who verbalized understanding of the instructions. No further questions at time of discharge.    ED Discharge Orders         Ordered    promethazine (PHENERGAN) 25 MG suppository  Every 6 hours PRN  05/29/19 0871           Follow Up: [name], MD [place] [id number] [phone]  Schedule an appointment as soon as possible for a visit  If symptoms do not improve or  worsen     This chart was dictated using voice recognition software.  Despite best efforts to proofread,  errors can occur which can change the documentation meaning.   Fatima Blank, MD 05/29/19 [phone]

## 2019-05-29 NOTE — ED Notes (Signed)
Medications given and charted per [facility]. Tolerated well.

## 2019-05-30 ENCOUNTER — Other Ambulatory Visit: Payer: Self-pay

## 2019-05-30 ENCOUNTER — Emergency Department (HOSPITAL_COMMUNITY)
Admission: EM | Admit: 2019-05-30 | Discharge: 2019-05-30 | Disposition: A | Discharge status: Home / Self Care | Payer: 59 | Source: Home / Self Care | Attending: Emergency Medicine | Admitting: Emergency Medicine

## 2019-05-30 DIAGNOSIS — K3184 Gastroparesis: Secondary | ICD-10-CM | POA: Insufficient documentation

## 2019-05-30 DIAGNOSIS — I10 Essential (primary) hypertension: Secondary | ICD-10-CM | POA: Insufficient documentation

## 2019-05-30 DIAGNOSIS — G8929 Other chronic pain: Secondary | ICD-10-CM | POA: Insufficient documentation

## 2019-05-30 DIAGNOSIS — R112 Nausea with vomiting, unspecified: Secondary | ICD-10-CM

## 2019-05-30 DIAGNOSIS — E876 Hypokalemia: Secondary | ICD-10-CM | POA: Insufficient documentation

## 2019-05-30 DIAGNOSIS — E1065 Type 1 diabetes mellitus with hyperglycemia: Secondary | ICD-10-CM | POA: Insufficient documentation

## 2019-05-30 DIAGNOSIS — R1013 Epigastric pain: Secondary | ICD-10-CM | POA: Insufficient documentation

## 2019-05-30 LAB — COMPREHENSIVE METABOLIC PANEL
ALT: 40 U/L (ref 0–44)
AST: 37 U/L (ref 15–41)
Albumin: 3.9 g/dL (ref 3.5–5.0)
Alkaline Phosphatase: 94 U/L (ref 38–126)
Anion gap: 21 — ABNORMAL HIGH (ref 5–15)
BUN: 11 mg/dL (ref 6–20)
CO2: 27 mmol/L (ref 22–32)
Calcium: 8.9 mg/dL (ref 8.9–10.3)
Chloride: 95 mmol/L — ABNORMAL LOW (ref 98–111)
Creatinine, Ser: 1.22 mg/dL — ABNORMAL HIGH (ref 0.44–1.00)
GFR calc Af Amer: >60 mL/min (ref >60)
GFR calc non Af Amer: 60 mL/min — ABNORMAL LOW (ref >60)
Glucose, Bld: 353 mg/dL — ABNORMAL HIGH (ref 70–99)
Potassium: 3 mmol/L — ABNORMAL LOW (ref 3.5–5.1)
Sodium: 143 mmol/L (ref 135–145)
Total Bilirubin: 1.1 mg/dL (ref 0.3–1.2)
Total Protein: 8.3 g/dL — ABNORMAL HIGH (ref 6.5–8.1)

## 2019-05-30 LAB — CBC
HCT: 35.9 % — ABNORMAL LOW (ref 36.0–46.0)
Hemoglobin: 11.3 g/dL — ABNORMAL LOW (ref 12.0–15.0)
MCH: 27.2 pg (ref 26.0–34.0)
MCHC: 31.5 g/dL (ref 30.0–36.0)
MCV: 86.5 fL (ref 80.0–100.0)
Platelets: 297 10*3/uL (ref 150–400)
RBC: 4.15 MIL/uL (ref 3.87–5.11)
RDW: 13.2 % (ref 11.5–15.5)
WBC: 3.7 10*3/uL — ABNORMAL LOW (ref 4.0–10.5)
nRBC: 0 % (ref 0.0–0.2)

## 2019-05-30 LAB — PATHOLOGIST SMEAR REVIEW

## 2019-05-30 LAB — LIPASE, BLOOD: Lipase: 18 U/L (ref 11–51)

## 2019-05-30 LAB — MAGNESIUM: Magnesium: 1.8 mg/dL (ref 1.7–2.4)

## 2019-05-30 MED ORDER — SODIUM CHLORIDE 0.9 % IV BOLUS
1000.0000 mL | Freq: Once | INTRAVENOUS | Status: AC
  Administered 2019-05-30: 1000 mL via INTRAVENOUS

## 2019-05-30 MED ORDER — FENTANYL CITRATE (PF) 100 MCG/2ML IJ SOLN
100.0000 ug | INTRAMUSCULAR | Status: DC | PRN
  Administered 2019-05-30: 100 ug via INTRAVENOUS
  Filled 2019-05-30: qty 2

## 2019-05-30 MED ORDER — POTASSIUM CHLORIDE 10 MEQ/100ML IV SOLN
10.0000 meq | INTRAVENOUS | Status: AC
  Administered 2019-05-30 (×2): 10 meq via INTRAVENOUS
  Filled 2019-05-30 (×2): qty 100

## 2019-05-30 MED ORDER — HYOSCYAMINE SULFATE 0.125 MG SL SUBL
0.2500 mg | SUBLINGUAL_TABLET | Freq: Once | SUBLINGUAL | Status: AC
  Administered 2019-05-30: 0.25 mg via SUBLINGUAL
  Filled 2019-05-30: qty 2

## 2019-05-30 MED ORDER — ALUM & MAG HYDROXIDE-SIMETH 200-200-20 MG/5ML PO SUSP
30.0000 mL | Freq: Once | ORAL | Status: AC
  Administered 2019-05-30: 30 mL via ORAL
  Filled 2019-05-30: qty 30

## 2019-05-30 MED ORDER — METOCLOPRAMIDE HCL 5 MG/ML IJ SOLN
10.0000 mg | Freq: Once | INTRAMUSCULAR | Status: AC
  Administered 2019-05-30: 10 mg via INTRAVENOUS
  Filled 2019-05-30: qty 2

## 2019-05-30 NOTE — ED Notes (Signed)
Observed patient drinking 6 oz of ice water. No vomiting observed

## 2019-05-30 NOTE — Discharge Instructions (Addendum)
Follow-up with your doctor as needed for problems.

## 2019-05-30 NOTE — ED Provider Notes (Signed)
[facility]-EMERGENCY DEPT Provider Note   CSN: [id number] Arrival date & time: 05/30/19  1916     History Chief Complaint  Patient presents with  . Emesis  . Back Pain    [name] is a 29 y.o. female.  HPI Patient with chronic abdominal pain, and frequent episodes of nausea vomiting presenting for same.  She was diagnosed with COVID-19 infection on 05/21/2019.  At that time she was essentially asymptomatic, and this was diagnosed based on screening testing for all admitted patients.  It is unknown when she actually became infected.  She does not have any ongoing respiratory symptoms since that date.  She has been seen in the ED 4 times, since that hospitalization; for her usual chronic pain and vomiting.  She has pain that is "like a band around her upper abdomen and mid back.  She denies fever, chills, coughing, shortness of breath, weakness or dizziness.  There are no other known modifying factors.      Past Medical History:  Diagnosis Date  . Allergy   . Anemia   . Anxiety   . Blood transfusion without reported diagnosis    Dec 2018  . Cataract    right eye  . COVID-19   . Depression   . Diabetes type 1, uncontrolled (HCC) 11/14/2011   Since age 21  . Fibromyalgia   . Gastroparesis   . GERD (gastroesophageal reflux disease)   . Hypertension   . Infection    UTI April 2016    Patient Active Problem List   Diagnosis Date Noted  . Nausea & vomiting 04/30/2019  . Hypertensive urgency 04/30/2019  . Intractable nausea and vomiting 04/30/2019  . Contraception management 08/06/2018  . Cyclical vomiting syndrome 06/07/2018  . Diabetic gastroparesis associated with type 1 diabetes mellitus (HCC) 06/04/2018  . Anxiety 05/31/2018  . Peripheral edema 05/31/2018  . Normocytic anemia 07/10/2017  . GERD (gastroesophageal reflux disease) 06/23/2017  . 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 Diabetes mellitus type  1 (HCC) 04/28/2013  . Protein-calorie malnutrition, severe (HCC) 04/28/2013  . Hypertension associated with diabetes (HCC) 11/14/2011  . Chronic abdominal pain 09/18/2011    Past Surgical History:  Procedure Laterality Date  . ANKLE SURGERY    . CHOLECYSTECTOMY  11/15/2011   Procedure: LAPAROSCOPIC CHOLECYSTECTOMY WITH INTRAOPERATIVE CHOLANGIOGRAM;  Surgeon: [name], MD;  Location: [facility];  Service: General;  Laterality: N/A;  . COLONOSCOPY    . COLONOSCOPY WITH PROPOFOL N/A 06/27/2017   Procedure: COLONOSCOPY WITH PROPOFOL;  Surgeon: Rachael Fee, MD;  Location: [facility];  Service: Endoscopy;  Laterality: N/A;  . ESOPHAGOGASTRODUODENOSCOPY  12/03/2011   Procedure: ESOPHAGOGASTRODUODENOSCOPY (EGD);  Surgeon: Theda Belfast, MD;  Location: [facility];  Service: Endoscopy;  Laterality: N/A;  . FLEXIBLE SIGMOIDOSCOPY N/A 03/10/2017   Procedure: FLEXIBLE SIGMOIDOSCOPY;  Surgeon: Jeani Hawking, MD;  Location: [facility];  Service: Endoscopy;  Laterality: N/A;  . INCISION AND DRAINAGE PERIRECTAL ABSCESS N/A 03/01/2017   Procedure: IRRIGATION AND DEBRIDEMENT PERIRECTAL ABSCESS;  Surgeon: Ovidio Kin, MD;  Location: [facility];  Service: General;  Laterality: N/A;  . IRRIGATION AND DEBRIDEMENT BUTTOCKS N/A 03/23/2017   Procedure: IRRIGATION AND DEBRIDEMENT BUTTOCKS, SETON PLACEMENT;  Surgeon: Romie Levee, MD;  Location: [facility];  Service: General;  Laterality: N/A;  . LAPAROSCOPY  11/23/2011   Procedure: LAPAROSCOPY DIAGNOSTIC;  Surgeon: Mariella Saa, MD;  Location: [facility];  Service: General;  Laterality: N/A;  . SIGMOIDOSCOPY    .  UPPER GASTROINTESTINAL ENDOSCOPY    . WISDOM TOOTH EXTRACTION       OB History    Gravida  1   Para  1   Term      Preterm  1   AB      Living  1     SAB      TAB      Ectopic      Multiple  0   Live Births  1           Family History  Problem Relation Age of Onset  . Diabetes Mother   . Hypertension Father   .  Colon cancer Paternal Grandmother        pt thinks PGM was dx in her 42's  . Diabetes Paternal Grandmother   . Diabetes Maternal Grandmother   . Diabetes Maternal Grandfather   . Diabetes Paternal Grandfather   . Diabetes Other   . Esophageal cancer Neg Hx   . Liver cancer Neg Hx   . Pancreatic cancer Neg Hx   . Stomach cancer Neg Hx   . Rectal cancer Neg Hx     Social History   Tobacco Use  . Smoking status: Never Smoker  . Smokeless tobacco: Never Used  Substance Use Topics  . Alcohol use: No    Alcohol/week: 0.0 standard drinks  . Drug use: Yes    Types: Marijuana    Home Medications Prior to Admission medications   Medication Sig Start Date End Date Taking? Authorizing Provider  cyclobenzaprine (FLEXERIL) 10 MG tablet TAKE 1 TABLET(10 MG) BY MOUTH THREE TIMES DAILY AS NEEDED FOR MUSCLE SPASMS Patient taking differently: Take 10 mg by mouth 3 (three) times daily as needed for muscle spasms.  04/29/19  Yes Ardith Dark, MD  dicyclomine (BENTYL) 20 MG tablet Take 1 tablet (20 mg total) by mouth 3 (three) times daily as needed for spasms. 05/01/19  Yes Rhetta Mura, MD  furosemide (LASIX) 40 MG tablet TAKE 1 TABLET BY MOUTH  DAILY Patient taking differently: Take 40 mg by mouth daily.  11/11/18  Yes Armbruster, Willaim Rayas, MD  hydrALAZINE (APRESOLINE) 50 MG tablet Take 1 tablet (50 mg total) by mouth every 6 (six) hours. Patient taking differently: Take 50 mg by mouth 2 (two) times daily.  10/23/18  Yes Swayze, Ava, DO  ibuprofen (ADVIL) 200 MG tablet Take 400 mg by mouth every 6 (six) hours as needed for moderate pain.   Yes [provider]  Insulin Glargine (LANTUS SOLOSTAR) 100 UNIT/ML Solostar Pen Inject 10 Units into the skin at bedtime. And pen needles 4/day Patient taking differently: Inject 12 Units into the skin at bedtime. And pen needles 4/day 12/31/18  Yes Romero Belling, MD  insulin lispro (HUMALOG KWIKPEN) 100 UNIT/ML KwikPen Inject 0.06 mLs (6 Units  total) into the skin 3 (three) times daily. Patient taking differently: Inject 1-9 Units into the skin 3 (three) times daily. Per sliding scale 12/31/18  Yes Romero Belling, MD  loperamide (IMODIUM) 2 MG capsule Take 6 mg by mouth as needed for diarrhea or loose stools.   Yes [provider]  LORazepam (ATIVAN) 0.5 MG tablet Take 1 tablet (0.5 mg total) by mouth 2 (two) times daily as needed for anxiety. 04/05/19  Yes Ardith Dark, MD  metoprolol tartrate (LOPRESSOR) 25 MG tablet Take 1 tablet (25 mg total) by mouth 2 (two) times daily. 05/22/19 06/21/19 Yes Arrien, York Ram, MD  mirtazapine (REMERON) 30 MG tablet  TAKE 1 TABLET BY MOUTH AT  BEDTIME Patient taking differently: Take 30 mg by mouth at bedtime.  04/19/19  Yes [name], MD  ondansetron (ZOFRAN) 4 MG tablet Take 1 tablet (4 mg total) by mouth every 8 (eight) hours as needed for nausea or vomiting. 05/22/19  Yes Arrien, Jimmy Picket, MD  OVER THE COUNTER MEDICATION Take 2 each by mouth daily. Flo Chewable Gummies for period   Yes [provider]  pantoprazole (PROTONIX) 40 MG tablet TAKE 1 TABLET BY MOUTH  DAILY Patient taking differently: Take 40 mg by mouth daily.  10/22/18  Yes Armbruster, Carlota Raspberry, MD  Continuous Blood Gluc Receiver ([place]) Elsa See admin instructions. USE AS DIRECTED TO MONITOR GLUCOSE LEVELS 10/29/18   [provider]  Continuous Blood Gluc Sensor (DEXCOM G6 SENSOR) MISC 1 each by Does not apply route See admin instructions. every 10 days. 12/31/18   Renato Shin, MD  Continuous Blood Gluc Transmit (DEXCOM G6 TRANSMITTER) MISC See admin instructions. USE AS DIRECTED TO MONITOR GLUCOSE LEVELS 10/29/18   [provider]  promethazine (PHENERGAN) 25 MG suppository Place 1 suppository (25 mg total) rectally every 6 (six) hours as needed for nausea or vomiting. 05/28/19   Rancour, Annie Main, MD  promethazine (PHENERGAN) 25 MG suppository Place 1 suppository (25 mg  total) rectally every 6 (six) hours as needed for nausea or vomiting. 05/29/19   Cardama, Grayce Sessions, MD  metoCLOPramide (REGLAN) 10 MG tablet Take 1 tablet (10 mg total) by mouth every 6 (six) hours as needed for nausea or vomiting. Patient not taking: Reported on 04/23/2019 02/20/18 04/23/19  Shelly Coss, MD  potassium chloride 20 MEQ TBCR Take 20 mEq by mouth 2 (two) times daily. Patient not taking: Reported on 03/31/2019 10/17/18 04/23/19  Isla Pence, MD  potassium chloride SA (KLOR-CON) 20 MEQ tablet Take 1 tablet (20 mEq total) by mouth daily. Patient not taking: Reported on 03/31/2019 03/14/19 04/23/19  Drenda Freeze, MD    Allergies    Other and Lactose intolerance (gi)  Review of Systems   Review of Systems  All other systems reviewed and are negative.   Physical Exam Updated Vital Signs BP (!) 190/115   Pulse (!) 118   Temp 97.8 F (36.6 C) (Oral)   Resp (!) 24   Ht 5\' 7"  (1.702 m)   Wt 59 kg   LMP 04/30/2019   SpO2 100%   BMI 20.36 kg/m   Physical Exam Vitals and nursing note reviewed.  Constitutional:      General: She is in acute distress (Uncomfortable).     Appearance: She is well-developed. She is not ill-appearing, toxic-appearing or diaphoretic.  HENT:     Head: Normocephalic and atraumatic.  Eyes:     Conjunctiva/sclera: Conjunctivae normal.     Pupils: Pupils are equal, round, and reactive to light.  Neck:     Trachea: Phonation normal.  Cardiovascular:     Rate and Rhythm: Normal rate and regular rhythm.  Pulmonary:     Effort: Pulmonary effort is normal.     Breath sounds: Normal breath sounds.  Chest:     Chest wall: No tenderness.  Abdominal:     General: There is no distension.     Palpations: Abdomen is soft.     Tenderness: There is abdominal tenderness (Moderate midepigastric and right upper quadrant tenderness). There is no guarding or rebound.     Hernia: No hernia is present.  Musculoskeletal:  General: Normal  range of motion.     Cervical back: Normal range of motion and neck supple.  Skin:    General: Skin is warm and dry.  Neurological:     Mental Status: She is alert and oriented to person, place, and time.     Motor: No abnormal muscle tone.  Psychiatric:        Behavior: Behavior normal.        Thought Content: Thought content normal.        Judgment: Judgment normal.     Comments: Tearful     ED Results / Procedures / Treatments   Labs (all labs ordered are listed, but only abnormal results are displayed) Labs Reviewed  COMPREHENSIVE METABOLIC PANEL - Abnormal; Notable for the following components:      Result Value   Potassium 3.0 (*)    Chloride 95 (*)    Glucose, Bld 353 (*)    Creatinine, Ser 1.22 (*)    Total Protein 8.3 (*)    GFR calc non Af Amer 60 (*)    Anion gap 21 (*)    All other components within normal limits  CBC - Abnormal; Notable for the following components:   WBC 3.7 (*)    Hemoglobin 11.3 (*)    HCT 35.9 (*)    All other components within normal limits  LIPASE, BLOOD  MAGNESIUM  URINALYSIS, ROUTINE W REFLEX MICROSCOPIC    EKG EKG Interpretation  Date/Time:  Monday May 30 2019 21:57:37 EST Ventricular Rate:  111 PR Interval:    QRS Duration: 83 QT Interval:  338 QTC Calculation: 460 R Axis:   81 Text Interpretation: Sinus tachycardia Right atrial enlargement Baseline wander in lead(s) V2 V3 V4 V6 Since last tracing rate faster ,  QTc slightly longer Otherwise no significant change Confirmed by Mancel Bale [phone]) on 05/30/2019 10:24:25 PM   Radiology No results found.  Procedures .Critical Care Performed by: Mancel Bale, MD Authorized by: Mancel Bale, MD   Critical care provider statement:    Critical care time (minutes):  35   Critical care start time:  05/30/2019 7:20 PM   Critical care end time:  05/30/2019 11:29 PM   Critical care time was exclusive of:  Separately billable procedures and treating other patients    Critical care was necessary to treat or prevent imminent or life-threatening deterioration of the following conditions:  Metabolic crisis   Critical care was time spent personally by me on the following activities:  Blood draw for specimens, development of treatment plan with patient or surrogate, discussions with consultants, evaluation of patient's response to treatment, examination of patient, obtaining history from patient or surrogate, ordering and performing treatments and interventions, ordering and review of laboratory studies, pulse oximetry, re-evaluation of patient's condition, review of old charts and ordering and review of radiographic studies   (including critical care time)  Medications Ordered in ED Medications  fentaNYL (SUBLIMAZE) injection 100 mcg (100 mcg Intravenous Given 05/30/19 2204)  sodium chloride 0.9 % bolus 1,000 mL (0 mLs Intravenous Stopped 05/30/19 2241)  alum & mag hydroxide-simeth (MAALOX/MYLANTA) 200-200-20 MG/5ML suspension 30 mL (30 mLs Oral Given 05/30/19 2120)  hyoscyamine (LEVSIN SL) SL tablet 0.25 mg (0.25 mg Sublingual Given 05/30/19 2121)  potassium chloride 10 mEq in 100 mL IVPB (0 mEq Intravenous Stopped 05/30/19 2305)  metoCLOPramide (REGLAN) injection 10 mg (10 mg Intravenous Given 05/30/19 2204)    ED Course  I have reviewed the triage vital signs and the  nursing notes.  Pertinent labs & imaging results that were available during my care of the patient were reviewed by me and considered in my medical decision making (see chart for details).  Clinical Course as of May 29 2324  Mon May 30, 2019  2220 Normal  Magnesium [EW]  2220 Normal except white count low, hemoglobin low  CBC(!) [EW]  2221 Normal except potassium low, chloride low, glucose high, creatinine high, total protein high  Comprehensive metabolic panel(!) [EW]  2221 Normal  Lipase, blood [EW]  2308 Patient states she is still uncomfortable, and vomited when she drank some water that she had.   This was not given by nursing, patient states she had with her.  This was not observed.  I asked nursing to give her a oral fluid challenge and assess her for tolerance.   [EW]    Clinical Course User Index [EW] Mancel Bale, MD   MDM Rules/Calculators/A&P                       Patient Vitals for the past 24 hrs:  BP Temp Temp src Pulse Resp SpO2 Height Weight  05/30/19 2300 (!) 190/115 - - (!) 118 (!) 24 100 % - -  05/30/19 2230 (!) 189/117 - - (!) 111 20 100 % - -  05/30/19 2200 (!) 156/107 - - (!) 113 11 100 % - -  05/30/19 1947 (!) 170/113 97.8 F (36.6 C) Oral (!) 112 20 99 % 5\' 7"  (1.702 m) 59 kg    11:26 PM Reevaluation with update and discussion. After initial assessment and treatment, an updated evaluation reveals she is now tolerating water, and states she has everything she needs at home.  Findings discussed and questions answered.   Medical Decision Making: Chronic abdominal pain, with recurrent nausea and vomiting and history of gastroparesis.  Patient with hypokalemia, and slightly lengthening QT on EKG.  Patient improved after treatment stable for discharge.  Doubt complication from recently diagnosed COVID-19 infection.  JAELEE LAUGHTER was evaluated in Emergency Department on 05/30/2019 for the symptoms described in the history of present illness. She was evaluated in the context of the global COVID-19 pandemic, which necessitated consideration that the patient might be at risk for infection with the SARS-CoV-2 virus that causes COVID-19. Institutional protocols and algorithms that pertain to the evaluation of patients at risk for COVID-19 are in a state of rapid change based on information released by regulatory bodies including the CDC and federal and state organizations. These policies and algorithms were followed during the patient's care in the ED.  CRITICAL CARE-no Performed by: 07/30/2019   Nursing Notes Reviewed/ Care Coordinated Applicable  Imaging Reviewed Interpretation of Laboratory Data incorporated into ED treatment  The patient appears reasonably screened and/or stabilized for discharge and I doubt any other medical condition or other [facility] requiring further screening, evaluation, or treatment in the ED at this time prior to discharge.  Plan: Home Medications-continue usual; Home Treatments-gradual advance diet; return here if the recommended treatment, does not improve the symptoms; Recommended follow up-PCP, as needed    Final Clinical Impression(s) / ED Diagnoses Final diagnoses:  Chronic abdominal pain  Nausea and vomiting, intractability of vomiting not specified, unspecified vomiting type  Hypokalemia  Gastroparesis    Rx / DC Orders ED Discharge Orders    None       [facility], MD 05/30/19 2331

## 2019-05-30 NOTE — ED Notes (Signed)
Pt encouraged to provide urine sample but states she cannot void at this time.

## 2019-05-30 NOTE — ED Notes (Signed)
Pt attempted to use BSC to provide urine specimen but was unable to do so. Pt able to assist herself out of bed and use BSC with no assist.

## 2019-05-31 ENCOUNTER — Inpatient Hospital Stay (HOSPITAL_COMMUNITY)
Admission: EM | Admit: 2019-05-31 | Discharge: 2019-06-03 | DRG: 179 | Disposition: A | Discharge status: Home / Self Care | Payer: 59 | Attending: Internal Medicine | Admitting: Internal Medicine

## 2019-05-31 ENCOUNTER — Encounter (HOSPITAL_COMMUNITY): Payer: Self-pay | Admitting: Family Medicine

## 2019-05-31 ENCOUNTER — Other Ambulatory Visit: Payer: Self-pay

## 2019-05-31 ENCOUNTER — Emergency Department (HOSPITAL_COMMUNITY): Payer: 59

## 2019-05-31 DIAGNOSIS — R1114 Bilious vomiting: Secondary | ICD-10-CM

## 2019-05-31 DIAGNOSIS — E109 Type 1 diabetes mellitus without complications: Secondary | ICD-10-CM | POA: Diagnosis present

## 2019-05-31 DIAGNOSIS — E1043 Type 1 diabetes mellitus with diabetic autonomic (poly)neuropathy: Secondary | ICD-10-CM | POA: Diagnosis present

## 2019-05-31 DIAGNOSIS — Z833 Family history of diabetes mellitus: Secondary | ICD-10-CM

## 2019-05-31 DIAGNOSIS — G8929 Other chronic pain: Secondary | ICD-10-CM | POA: Diagnosis present

## 2019-05-31 DIAGNOSIS — E1069 Type 1 diabetes mellitus with other specified complication: Secondary | ICD-10-CM | POA: Diagnosis present

## 2019-05-31 DIAGNOSIS — R112 Nausea with vomiting, unspecified: Secondary | ICD-10-CM | POA: Diagnosis present

## 2019-05-31 DIAGNOSIS — Z8249 Family history of ischemic heart disease and other diseases of the circulatory system: Secondary | ICD-10-CM

## 2019-05-31 DIAGNOSIS — T407X1A Poisoning by cannabis (derivatives), accidental (unintentional), initial encounter: Secondary | ICD-10-CM | POA: Diagnosis present

## 2019-05-31 DIAGNOSIS — D649 Anemia, unspecified: Secondary | ICD-10-CM | POA: Diagnosis present

## 2019-05-31 DIAGNOSIS — I10 Essential (primary) hypertension: Secondary | ICD-10-CM

## 2019-05-31 DIAGNOSIS — E10649 Type 1 diabetes mellitus with hypoglycemia without coma: Secondary | ICD-10-CM | POA: Diagnosis not present

## 2019-05-31 DIAGNOSIS — K3184 Gastroparesis: Secondary | ICD-10-CM

## 2019-05-31 DIAGNOSIS — K529 Noninfective gastroenteritis and colitis, unspecified: Secondary | ICD-10-CM

## 2019-05-31 DIAGNOSIS — F1290 Cannabis use, unspecified, uncomplicated: Secondary | ICD-10-CM

## 2019-05-31 DIAGNOSIS — U071 COVID-19: Principal | ICD-10-CM | POA: Diagnosis present

## 2019-05-31 DIAGNOSIS — Z79899 Other long term (current) drug therapy: Secondary | ICD-10-CM

## 2019-05-31 DIAGNOSIS — I152 Hypertension secondary to endocrine disorders: Secondary | ICD-10-CM | POA: Diagnosis present

## 2019-05-31 DIAGNOSIS — E876 Hypokalemia: Secondary | ICD-10-CM

## 2019-05-31 DIAGNOSIS — R109 Unspecified abdominal pain: Secondary | ICD-10-CM | POA: Diagnosis not present

## 2019-05-31 DIAGNOSIS — R1115 Cyclical vomiting syndrome unrelated to migraine: Secondary | ICD-10-CM | POA: Diagnosis present

## 2019-05-31 DIAGNOSIS — E1159 Type 2 diabetes mellitus with other circulatory complications: Secondary | ICD-10-CM

## 2019-05-31 DIAGNOSIS — E739 Lactose intolerance, unspecified: Secondary | ICD-10-CM | POA: Diagnosis present

## 2019-05-31 DIAGNOSIS — R1110 Vomiting, unspecified: Secondary | ICD-10-CM | POA: Diagnosis present

## 2019-05-31 DIAGNOSIS — Z91018 Allergy to other foods: Secondary | ICD-10-CM

## 2019-05-31 DIAGNOSIS — E1065 Type 1 diabetes mellitus with hyperglycemia: Secondary | ICD-10-CM | POA: Diagnosis present

## 2019-05-31 DIAGNOSIS — Z794 Long term (current) use of insulin: Secondary | ICD-10-CM

## 2019-05-31 LAB — CBG MONITORING, ED: Glucose-Capillary: 426 mg/dL — ABNORMAL HIGH (ref 70–99)

## 2019-05-31 LAB — URINALYSIS, ROUTINE W REFLEX MICROSCOPIC
Bacteria, UA: NONE SEEN
Bilirubin Urine: NEGATIVE
Glucose, UA: >=500 mg/dL — AB
Ketones, ur: 5 mg/dL — AB
Leukocytes,Ua: NEGATIVE
Nitrite: NEGATIVE
Protein, ur: 100 mg/dL — AB
RBC / HPF: >50 RBC/hpf — ABNORMAL HIGH (ref 0–5)
Specific Gravity, Urine: 1.014 (ref 1.005–1.030)
pH: 6 (ref 5.0–8.0)

## 2019-05-31 LAB — CBC WITH DIFFERENTIAL/PLATELET
Abs Immature Granulocytes: 0.03 10*3/uL (ref 0.00–0.07)
Basophils Absolute: 0 10*3/uL (ref 0.0–0.1)
Basophils Relative: 0 %
Eosinophils Absolute: 0.1 10*3/uL (ref 0.0–0.5)
Eosinophils Relative: 2 %
HCT: 35.1 % — ABNORMAL LOW (ref 36.0–46.0)
Hemoglobin: 11.3 g/dL — ABNORMAL LOW (ref 12.0–15.0)
Immature Granulocytes: 1 %
Lymphocytes Relative: 34 %
Lymphs Abs: 1.7 10*3/uL (ref 0.7–4.0)
MCH: 27.6 pg (ref 26.0–34.0)
MCHC: 32.2 g/dL (ref 30.0–36.0)
MCV: 85.6 fL (ref 80.0–100.0)
Monocytes Absolute: 0.4 10*3/uL (ref 0.1–1.0)
Monocytes Relative: 7 %
Neutro Abs: 2.9 10*3/uL (ref 1.7–7.7)
Neutrophils Relative %: 56 %
Platelets: 333 10*3/uL (ref 150–400)
RBC: 4.1 MIL/uL (ref 3.87–5.11)
RDW: 13.2 % (ref 11.5–15.5)
WBC: 5.2 10*3/uL (ref 4.0–10.5)
nRBC: 0 % (ref 0.0–0.2)

## 2019-05-31 LAB — I-STAT BETA HCG BLOOD, ED (MC, WL, AP ONLY): I-stat hCG, quantitative: <5 m[IU]/mL (ref <5)

## 2019-05-31 LAB — COMPREHENSIVE METABOLIC PANEL
ALT: 33 U/L (ref 0–44)
AST: 21 U/L (ref 15–41)
Albumin: 3.5 g/dL (ref 3.5–5.0)
Alkaline Phosphatase: 84 U/L (ref 38–126)
Anion gap: 11 (ref 5–15)
BUN: 10 mg/dL (ref 6–20)
CO2: 31 mmol/L (ref 22–32)
Calcium: 9 mg/dL (ref 8.9–10.3)
Chloride: 92 mmol/L — ABNORMAL LOW (ref 98–111)
Creatinine, Ser: 0.99 mg/dL (ref 0.44–1.00)
GFR calc Af Amer: >60 mL/min (ref >60)
GFR calc non Af Amer: >60 mL/min (ref >60)
Glucose, Bld: 466 mg/dL — ABNORMAL HIGH (ref 70–99)
Potassium: 3.3 mmol/L — ABNORMAL LOW (ref 3.5–5.1)
Sodium: 134 mmol/L — ABNORMAL LOW (ref 135–145)
Total Bilirubin: 0.5 mg/dL (ref 0.3–1.2)
Total Protein: 7.8 g/dL (ref 6.5–8.1)

## 2019-05-31 LAB — CULTURE, BLOOD (ROUTINE X 2)
Culture: NO GROWTH
Special Requests: ADEQUATE

## 2019-05-31 LAB — LIPASE, BLOOD: Lipase: 23 U/L (ref 11–51)

## 2019-05-31 LAB — ETHANOL: Alcohol, Ethyl (B): <10 mg/dL (ref <10)

## 2019-05-31 IMAGING — CT CT RENAL STONE PROTOCOL
2 of 4 series · 15 of 46 positions shown, 17 images · non-contrast
Comparison: CT of the abdomen pelvis dated [DATE].

CLINICAL DATA: 28-year-old female with nausea vomiting and flank
pain. Kidney stone suspected.

EXAM:
CT ABDOMEN AND PELVIS WITHOUT CONTRAST
TECHNIQUE: Multidetector CT imaging of the abdomen and pelvis was performed
following the standard protocol without IV contrast.

[Series 2: axial st · axial · 0.64mm/px · z∈[-496,-76]mm · 12 of 94 slices shown, 14 images]
[im 5/94  soft-tissue]
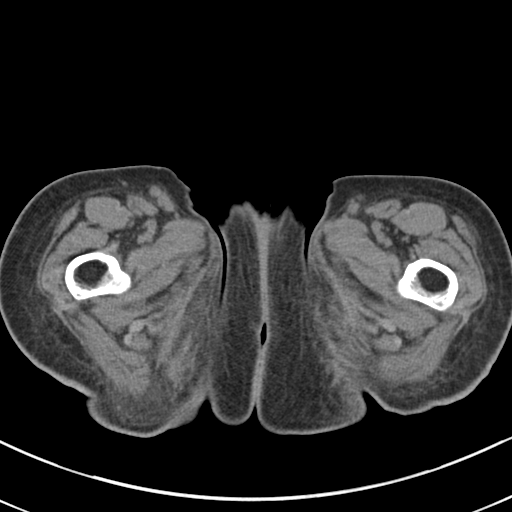
[im 5/94  bone]
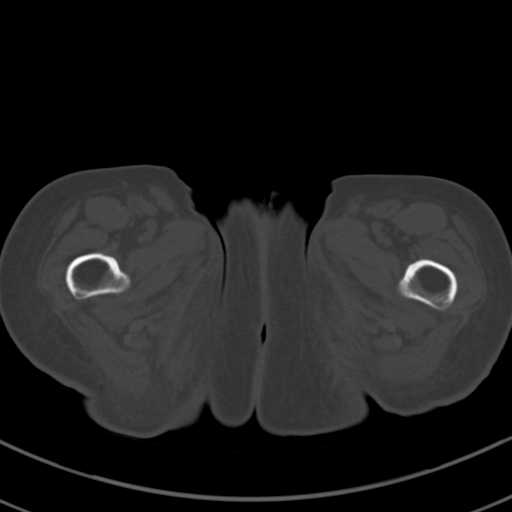
[im 15/94  soft-tissue]
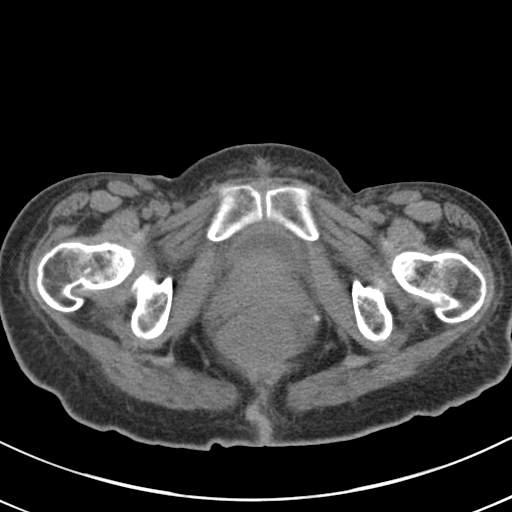
[im 20/94  soft-tissue]
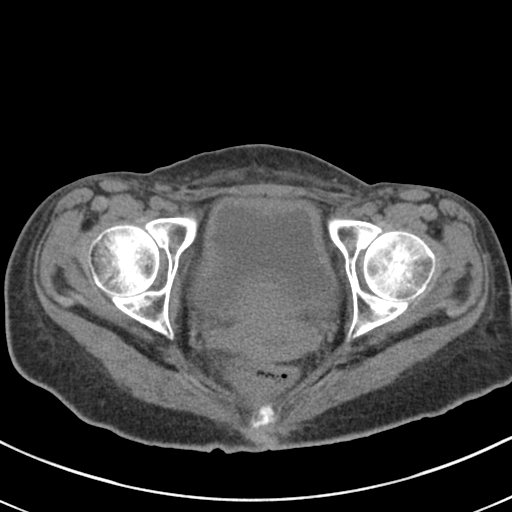
[im 30/94  soft-tissue]
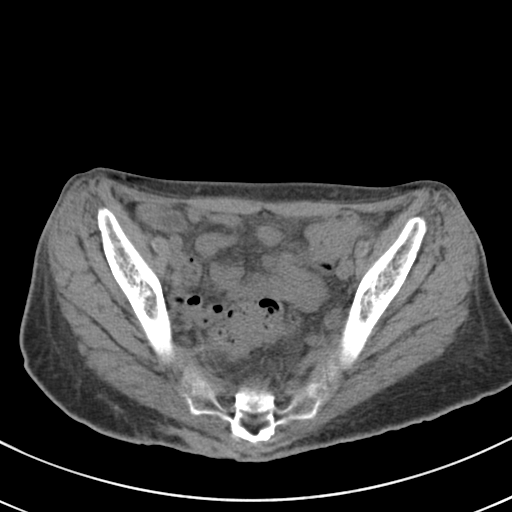
[im 35/94  soft-tissue]
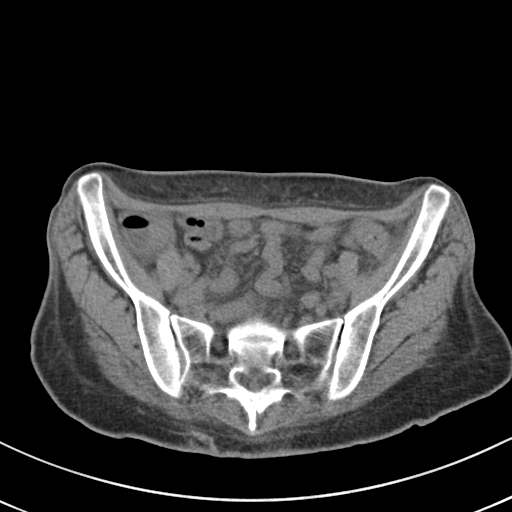
[im 45/94  soft-tissue]
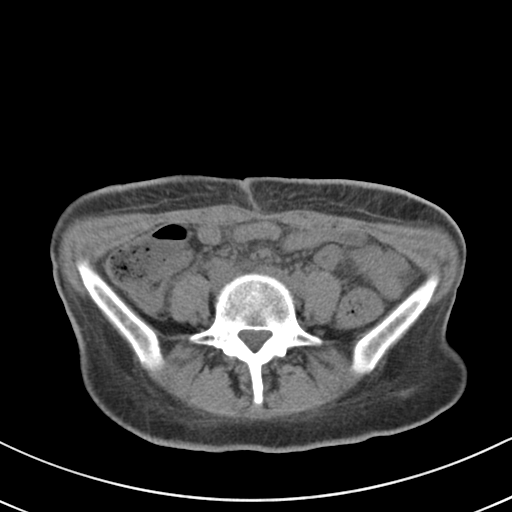
[im 49/94  soft-tissue]
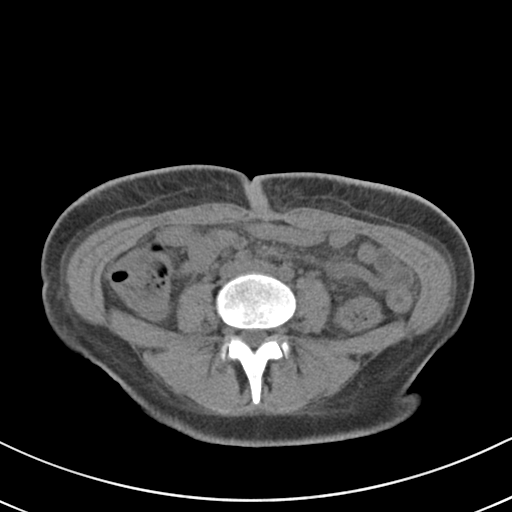
[im 59/94  soft-tissue]
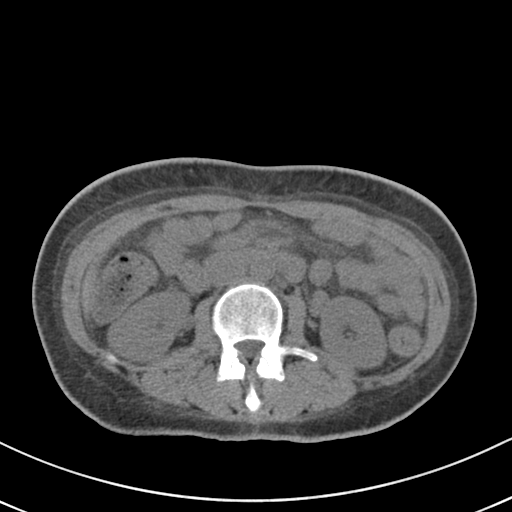
[im 64/94  soft-tissue]
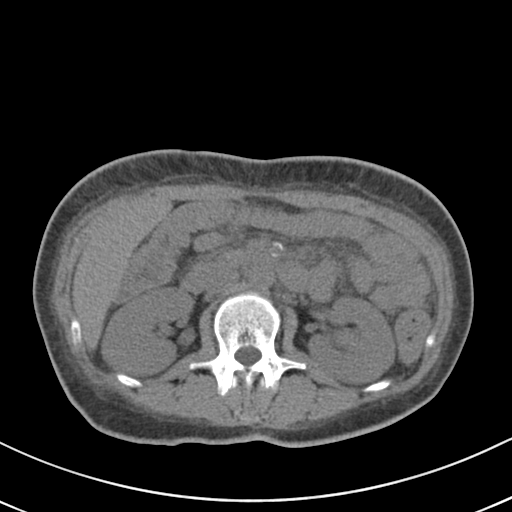
[im 64/94  bone]
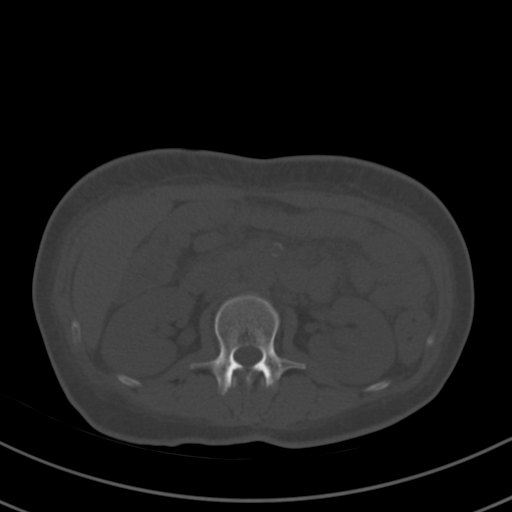
[im 74/94  soft-tissue]
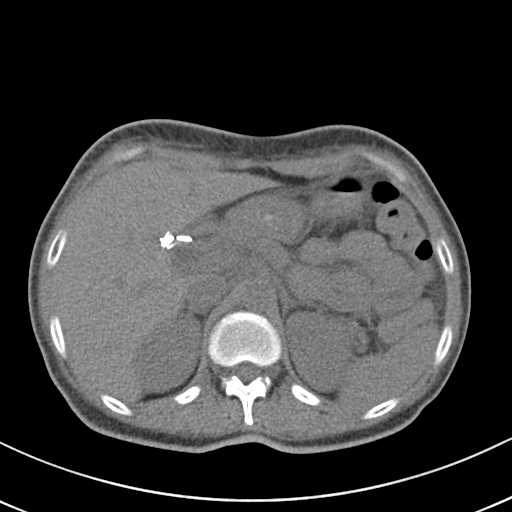
[im 79/94  soft-tissue]
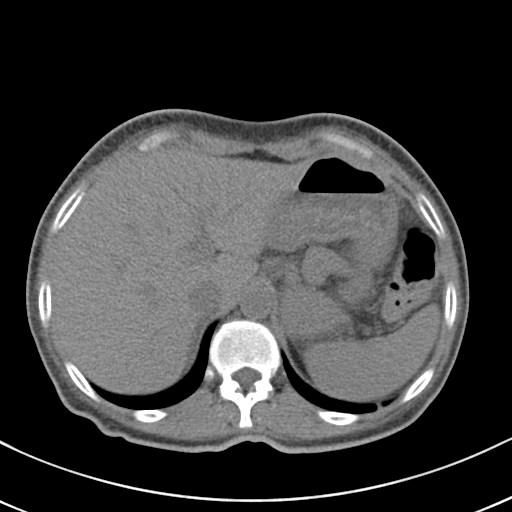
[im 89/94  soft-tissue]
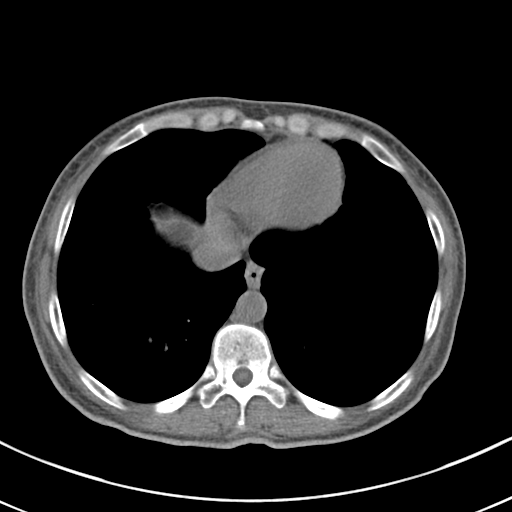

[Series 5: coronal · coronal · 0.60mm/px · 3 of 122 slices shown]
[im 41/122  soft-tissue]
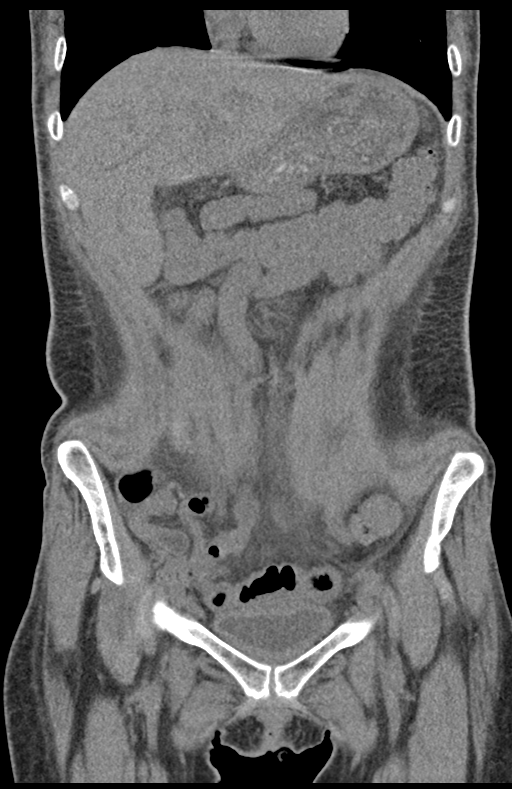
[im 54/122  soft-tissue]
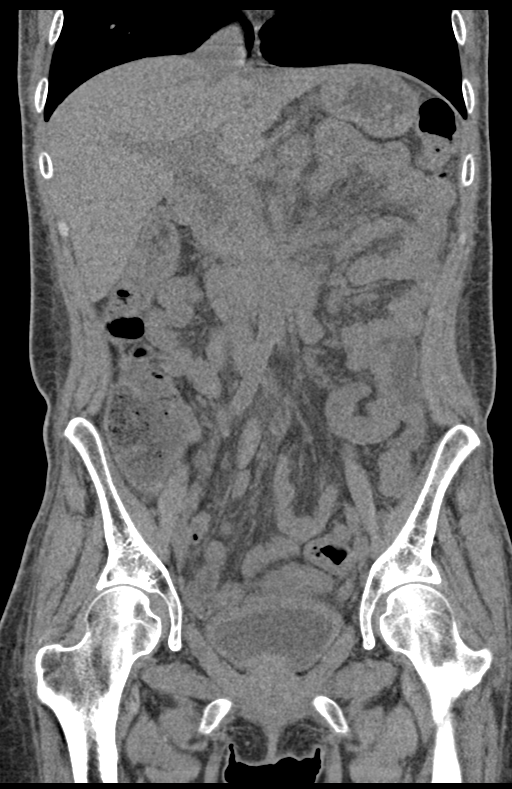
[im 68/122  soft-tissue]
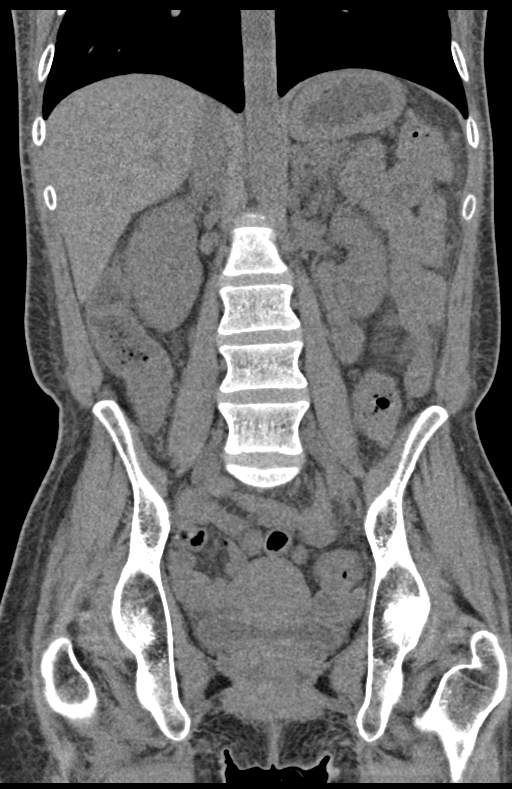

[15 of 46 positions shown; findings below may reference images not displayed]

FINDINGS: Evaluation of this exam is limited in the absence of intravenous
contrast.

Lower chest: The visualized lung bases are clear.

No intra-abdominal free air. There is diffuse mesenteric edema and
small free fluid within the pelvis.

Hepatobiliary: The liver is unremarkable. Cholecystectomy. No
retained calcified stone noted in the central CBD.

Pancreas: Unremarkable. No pancreatic ductal dilatation or
surrounding inflammatory changes.

Spleen: Normal in size without focal abnormality.

Adrenals/Urinary Tract: The adrenal glands are unremarkable. There
is no hydronephrosis or nephrolithiasis on either side. The
visualized ureters and urinary bladder appear unremarkable.

Stomach/Bowel: There is mild thickened appearance of the colon
concerning for mild colitis. Clinical correlation is recommended.
There is no bowel obstruction. The appendix is normal.

Vascular/Lymphatic: The abdominal aorta and IVC are unremarkable. No
portal venous gas. There is no adenopathy.

Reproductive: Mild diffuse subcu scar a stat the uterus and ovaries
are grossly unremarkable.

Other: Mild diffuse subcutaneous edema.

Musculoskeletal: No acute or significant osseous findings.
IMPRESSION: 1. Findings concerning for mild colitis. Clinical correlation is
recommended. No bowel obstruction. Normal appendix.
2. No hydronephrosis or nephrolithiasis.

## 2019-05-31 MED ORDER — SODIUM CHLORIDE 0.9 % IV BOLUS
1000.0000 mL | Freq: Once | INTRAVENOUS | Status: AC
  Administered 2019-05-31: 1000 mL via INTRAVENOUS

## 2019-05-31 MED ORDER — METRONIDAZOLE IN NACL 5-0.79 MG/ML-% IV SOLN: 500.0000 mg | Freq: Once | INTRAVENOUS | Status: DC

## 2019-05-31 MED ORDER — CYCLOBENZAPRINE HCL 10 MG PO TABS
10.0000 mg | ORAL_TABLET | Freq: Three times a day (TID) | ORAL | Status: DC | PRN
  Administered 2019-06-01: 10 mg via ORAL
  Filled 2019-05-31 (×2): qty 1

## 2019-05-31 MED ORDER — LABETALOL HCL 5 MG/ML IV SOLN
10.0000 mg | Freq: Once | INTRAVENOUS | Status: AC
  Administered 2019-05-31: 10 mg via INTRAVENOUS
  Filled 2019-05-31: qty 4

## 2019-05-31 MED ORDER — SODIUM CHLORIDE 0.9 % IV SOLN: 100.0000 mg | Freq: Every day | INTRAVENOUS | Status: DC

## 2019-05-31 MED ORDER — METOPROLOL TARTRATE 25 MG PO TABS
25.0000 mg | ORAL_TABLET | Freq: Two times a day (BID) | ORAL | Status: DC
  Administered 2019-05-31 – 2019-06-03 (×6): 25 mg via ORAL
  Filled 2019-05-31 (×7): qty 1

## 2019-05-31 MED ORDER — PANTOPRAZOLE SODIUM 40 MG PO TBEC
40.0000 mg | DELAYED_RELEASE_TABLET | Freq: Every day | ORAL | Status: DC
  Administered 2019-06-01: 40 mg via ORAL
  Filled 2019-05-31: qty 1

## 2019-05-31 MED ORDER — HYOSCYAMINE SULFATE 0.125 MG PO TABS
0.2500 mg | ORAL_TABLET | Freq: Once | ORAL | Status: DC
  Filled 2019-05-31: qty 2

## 2019-05-31 MED ORDER — SODIUM CHLORIDE 0.9 % IV SOLN
100.0000 mg | INTRAVENOUS | Status: DC
  Filled 2019-05-31 (×2): qty 20

## 2019-05-31 MED ORDER — ONDANSETRON HCL 4 MG PO TABS: 4.0000 mg | ORAL_TABLET | Freq: Four times a day (QID) | ORAL | Status: DC | PRN

## 2019-05-31 MED ORDER — CIPROFLOXACIN IN D5W 400 MG/200ML IV SOLN: 400.0000 mg | Freq: Once | INTRAVENOUS | Status: DC

## 2019-05-31 MED ORDER — INSULIN ASPART 100 UNIT/ML ~~LOC~~ SOLN
9.0000 [IU] | SUBCUTANEOUS | Status: AC
  Administered 2019-05-31: 9 [IU] via SUBCUTANEOUS
  Filled 2019-05-31: qty 0.09

## 2019-05-31 MED ORDER — LORAZEPAM 0.5 MG PO TABS
0.5000 mg | ORAL_TABLET | Freq: Two times a day (BID) | ORAL | Status: DC | PRN
  Administered 2019-06-01 – 2019-06-03 (×2): 0.5 mg via ORAL
  Filled 2019-05-31 (×3): qty 1

## 2019-05-31 MED ORDER — METOCLOPRAMIDE HCL 5 MG/ML IJ SOLN
10.0000 mg | Freq: Three times a day (TID) | INTRAMUSCULAR | Status: DC
  Administered 2019-05-31 – 2019-06-02 (×6): 10 mg via INTRAVENOUS
  Filled 2019-05-31 (×6): qty 2

## 2019-05-31 MED ORDER — KETOROLAC TROMETHAMINE 15 MG/ML IJ SOLN
15.0000 mg | Freq: Once | INTRAMUSCULAR | Status: AC
  Administered 2019-05-31: 15 mg via INTRAVENOUS
  Filled 2019-05-31: qty 1

## 2019-05-31 MED ORDER — ACETAMINOPHEN 325 MG PO TABS: 650.0000 mg | ORAL_TABLET | Freq: Four times a day (QID) | ORAL | Status: DC | PRN

## 2019-05-31 MED ORDER — HYDRALAZINE HCL 50 MG PO TABS
50.0000 mg | ORAL_TABLET | Freq: Two times a day (BID) | ORAL | Status: DC
  Administered 2019-05-31 – 2019-06-03 (×6): 50 mg via ORAL
  Filled 2019-05-31: qty 1
  Filled 2019-05-31: qty 5
  Filled 2019-05-31 (×5): qty 1

## 2019-05-31 MED ORDER — DICYCLOMINE HCL 20 MG PO TABS
20.0000 mg | ORAL_TABLET | Freq: Three times a day (TID) | ORAL | Status: DC | PRN
  Filled 2019-05-31: qty 1

## 2019-05-31 MED ORDER — INSULIN ASPART 100 UNIT/ML ~~LOC~~ SOLN
0.0000 [IU] | SUBCUTANEOUS | Status: DC
  Administered 2019-06-01: 2 [IU] via SUBCUTANEOUS
  Filled 2019-05-31: qty 0.09

## 2019-05-31 MED ORDER — INSULIN GLARGINE 100 UNIT/ML ~~LOC~~ SOLN
10.0000 [IU] | Freq: Every day | SUBCUTANEOUS | Status: DC
  Administered 2019-05-31: 10 [IU] via SUBCUTANEOUS
  Filled 2019-05-31 (×2): qty 0.1

## 2019-05-31 MED ORDER — PROCHLORPERAZINE EDISYLATE 10 MG/2ML IJ SOLN
10.0000 mg | Freq: Once | INTRAMUSCULAR | Status: AC
  Administered 2019-05-31: 10 mg via INTRAVENOUS
  Filled 2019-05-31: qty 2

## 2019-05-31 MED ORDER — HALOPERIDOL LACTATE 5 MG/ML IJ SOLN
2.0000 mg | Freq: Four times a day (QID) | INTRAMUSCULAR | Status: DC | PRN
  Filled 2019-05-31: qty 1

## 2019-05-31 MED ORDER — LACTATED RINGERS IV SOLN: INTRAVENOUS | Status: DC

## 2019-05-31 MED ORDER — ENOXAPARIN SODIUM 40 MG/0.4ML ~~LOC~~ SOLN
40.0000 mg | SUBCUTANEOUS | Status: DC
  Administered 2019-05-31 – 2019-06-02 (×3): 40 mg via SUBCUTANEOUS
  Filled 2019-05-31 (×3): qty 0.4

## 2019-05-31 MED ORDER — POTASSIUM CHLORIDE 10 MEQ/100ML IV SOLN
10.0000 meq | INTRAVENOUS | Status: AC
  Administered 2019-06-01 (×2): 10 meq via INTRAVENOUS
  Filled 2019-05-31 (×2): qty 100

## 2019-05-31 MED ORDER — ONDANSETRON HCL 4 MG/2ML IJ SOLN
4.0000 mg | Freq: Four times a day (QID) | INTRAMUSCULAR | Status: DC | PRN
  Administered 2019-06-01: 4 mg via INTRAVENOUS
  Filled 2019-05-31: qty 2

## 2019-05-31 MED ORDER — HYOSCYAMINE SULFATE 0.125 MG SL SUBL
0.2500 mg | SUBLINGUAL_TABLET | Freq: Once | SUBLINGUAL | Status: AC
  Administered 2019-05-31: 0.25 mg via SUBLINGUAL
  Filled 2019-05-31: qty 2

## 2019-05-31 MED ORDER — SODIUM CHLORIDE 0.9 % IV SOLN
100.0000 mg | Freq: Every day | INTRAVENOUS | Status: DC
  Administered 2019-06-01: 100 mg via INTRAVENOUS

## 2019-05-31 MED ORDER — CAPSAICIN 0.025 % EX CREA
TOPICAL_CREAM | Freq: Two times a day (BID) | CUTANEOUS | Status: DC | PRN
  Filled 2019-05-31: qty 60

## 2019-05-31 MED ORDER — SODIUM CHLORIDE 0.9 % IV SOLN
200.0000 mg | Freq: Once | INTRAVENOUS | Status: DC
  Filled 2019-05-31: qty 40

## 2019-05-31 MED ORDER — MIRTAZAPINE 15 MG PO TABS
30.0000 mg | ORAL_TABLET | Freq: Every day | ORAL | Status: DC
  Administered 2019-05-31 – 2019-06-02 (×3): 30 mg via ORAL
  Filled 2019-05-31: qty 2
  Filled 2019-05-31: qty 1
  Filled 2019-05-31: qty 2

## 2019-05-31 MED ORDER — SODIUM CHLORIDE 0.9 % IV SOLN: INTRAVENOUS | Status: DC

## 2019-05-31 MED ORDER — ACETAMINOPHEN 650 MG RE SUPP: 650.0000 mg | Freq: Four times a day (QID) | RECTAL | Status: DC | PRN

## 2019-05-31 NOTE — H&P (Signed)
History and Physical    [name] AYT:[id number] DOB: November 14, 1990 DOA: 05/31/2019  PCP: [name], MD  Patient coming from: Home  I have personally briefly reviewed patient's old medical records in [place]  Chief Complaint: emesis, abd pain  HPI: [name] is a 29 y.o. female with medical history significant of DM1 uncontrolled, last admitted with DKA ~10 days ago.  Cyclic vomiting syndrome and chronic abd pain either due to diabetic gastroparesis, canabinoid hyperemesis syndrome, or some combination of the two.  Very frequent ED visits with this.  Just in ED yesterday.  Dx with COVID on 05/21/19, no respiratory symptoms, no SIRS, had N/V, abd pain, etc though suspicion is this may just be her chronic abd symptoms (today is her 10th ED visit and 3rd admission this year).  Presents to ED today with ongoing N/V and epigastric pain.  Nothing makes worse.  Hot showers seem to help some.  Quality is band like around upper abdomen and mid back.  She denies fever, chills, coughing, shortness of breath, weakness or dizziness.   ED Course: Symptoms persist despite anti-nausea meds.  Not regular about taking glargine.  CT abd/pelvis ? Of colitis.  WBC nl, no SIRS.   Review of Systems: As per HPI, otherwise all review of systems negative.  Past Medical History:  Diagnosis Date  . Allergy   . Anemia   . Anxiety   . Blood transfusion without reported diagnosis    Dec 2018  . Cataract    right eye  . COVID-19   . Depression   . Diabetes type 1, uncontrolled ([place]) 11/14/2011   Since age 81  . Fibromyalgia   . Gastroparesis   . GERD (gastroesophageal reflux disease)   . Hypertension   . Infection    UTI April 2016    Past Surgical History:  Procedure Laterality Date  . ANKLE SURGERY    . CHOLECYSTECTOMY  11/15/2011   Procedure: LAPAROSCOPIC CHOLECYSTECTOMY WITH INTRAOPERATIVE CHOLANGIOGRAM;  Surgeon: [name], MD;  Location: [facility];  Service:  General;  Laterality: N/A;  . COLONOSCOPY    . COLONOSCOPY WITH PROPOFOL N/A 06/27/2017   Procedure: COLONOSCOPY WITH PROPOFOL;  Surgeon: Milus Banister, MD;  Location: [facility];  Service: Endoscopy;  Laterality: N/A;  . ESOPHAGOGASTRODUODENOSCOPY  12/03/2011   Procedure: ESOPHAGOGASTRODUODENOSCOPY (EGD);  Surgeon: Beryle Beams, MD;  Location: [facility];  Service: Endoscopy;  Laterality: N/A;  . FLEXIBLE SIGMOIDOSCOPY N/A 03/10/2017   Procedure: FLEXIBLE SIGMOIDOSCOPY;  Surgeon: Carol Ada, MD;  Location: [facility];  Service: Endoscopy;  Laterality: N/A;  . INCISION AND DRAINAGE PERIRECTAL ABSCESS N/A 03/01/2017   Procedure: IRRIGATION AND DEBRIDEMENT PERIRECTAL ABSCESS;  Surgeon: Alphonsa Overall, MD;  Location: [facility];  Service: General;  Laterality: N/A;  . IRRIGATION AND DEBRIDEMENT BUTTOCKS N/A 03/23/2017   Procedure: IRRIGATION AND DEBRIDEMENT BUTTOCKS, SETON PLACEMENT;  Surgeon: [name], MD;  Location: [facility];  Service: General;  Laterality: N/A;  . LAPAROSCOPY  11/23/2011   Procedure: LAPAROSCOPY DIAGNOSTIC;  Surgeon: Edward Jolly, MD;  Location: [facility];  Service: General;  Laterality: N/A;  . SIGMOIDOSCOPY    . UPPER GASTROINTESTINAL ENDOSCOPY    . WISDOM TOOTH EXTRACTION       reports that she has never smoked. She has never used smokeless tobacco. She reports current drug use. Drug: Marijuana. She reports that she does not drink alcohol.  Allergies  Allergen Reactions  . Other Anaphylaxis    Reaction to  [place] nuts   . Lactose Intolerance (Gi) Diarrhea    Family History  Problem Relation Age of Onset  . Diabetes Mother   . Hypertension Father   . Colon cancer Paternal Grandmother        pt thinks PGM was dx in her 84's  . Diabetes Paternal Grandmother   . Diabetes Maternal Grandmother   . Diabetes Maternal Grandfather   . Diabetes Paternal Grandfather   . Diabetes Other   . Esophageal cancer Neg Hx   . Liver cancer Neg Hx   . Pancreatic  cancer Neg Hx   . Stomach cancer Neg Hx   . Rectal cancer Neg Hx      Prior to Admission medications   Medication Sig Start Date End Date Taking? Authorizing Provider  cyclobenzaprine (FLEXERIL) 10 MG tablet TAKE 1 TABLET(10 MG) BY MOUTH THREE TIMES DAILY AS NEEDED FOR MUSCLE SPASMS Patient taking differently: Take 10 mg by mouth 3 (three) times daily as needed for muscle spasms.  04/29/19  Yes [name], MD  dicyclomine (BENTYL) 20 MG tablet Take 1 tablet (20 mg total) by mouth 3 (three) times daily as needed for spasms. 05/01/19  Yes [name], MD  furosemide (LASIX) 40 MG tablet TAKE 1 TABLET BY MOUTH  DAILY Patient taking differently: Take 40 mg by mouth daily.  11/11/18  Yes Armbruster, Carlota Raspberry, MD  hydrALAZINE (APRESOLINE) 50 MG tablet Take 1 tablet (50 mg total) by mouth every 6 (six) hours. Patient taking differently: Take 50 mg by mouth 2 (two) times daily.  10/23/18  Yes Swayze, Ava, DO  ibuprofen (ADVIL) 200 MG tablet Take 400 mg by mouth every 6 (six) hours as needed for moderate pain.   Yes [provider]  Insulin Glargine (LANTUS SOLOSTAR) 100 UNIT/ML Solostar Pen Inject 10 Units into the skin at bedtime. And pen needles 4/day Patient taking differently: Inject 12 Units into the skin at bedtime. And pen needles 4/day 12/31/18  Yes Renato Shin, MD  insulin lispro (HUMALOG KWIKPEN) 100 UNIT/ML KwikPen Inject 0.06 mLs (6 Units total) into the skin 3 (three) times daily. Patient taking differently: Inject 1-9 Units into the skin 3 (three) times daily. Per sliding scale 12/31/18  Yes Renato Shin, MD  loperamide (IMODIUM) 2 MG capsule Take 6 mg by mouth as needed for diarrhea or loose stools.   Yes [provider]  LORazepam (ATIVAN) 0.5 MG tablet Take 1 tablet (0.5 mg total) by mouth 2 (two) times daily as needed for anxiety. 04/05/19  Yes [name], MD  metoprolol tartrate (LOPRESSOR) 25 MG tablet Take 1 tablet (25 mg total) by mouth 2 (two)  times daily. 05/22/19 06/21/19 Yes Arrien, Jimmy Picket, MD  mirtazapine (REMERON) 30 MG tablet TAKE 1 TABLET BY MOUTH AT  BEDTIME Patient taking differently: Take 30 mg by mouth at bedtime.  04/19/19  Yes [name], MD  ondansetron (ZOFRAN) 4 MG tablet Take 1 tablet (4 mg total) by mouth every 8 (eight) hours as needed for nausea or vomiting. 05/22/19  Yes Arrien, Jimmy Picket, MD  OVER THE COUNTER MEDICATION Take 2 each by mouth daily. Flo Chewable Gummies for period   Yes [provider]  pantoprazole (PROTONIX) 40 MG tablet TAKE 1 TABLET BY MOUTH  DAILY Patient taking differently: Take 40 mg by mouth daily.  10/22/18  Yes Armbruster, Carlota Raspberry, MD  Continuous Blood Gluc Receiver ([place]) [place] See admin instructions. USE AS DIRECTED TO MONITOR GLUCOSE LEVELS  10/29/18   [provider]  Continuous Blood Gluc Sensor (DEXCOM G6 SENSOR) MISC 1 each by Does not apply route See admin instructions. every 10 days. 12/31/18   Renato Shin, MD  Continuous Blood Gluc Transmit (DEXCOM G6 TRANSMITTER) MISC See admin instructions. USE AS DIRECTED TO MONITOR GLUCOSE LEVELS 10/29/18   [provider]  promethazine (PHENERGAN) 25 MG suppository Place 1 suppository (25 mg total) rectally every 6 (six) hours as needed for nausea or vomiting. 05/28/19   Rancour, Annie Main, MD  promethazine (PHENERGAN) 25 MG suppository Place 1 suppository (25 mg total) rectally every 6 (six) hours as needed for nausea or vomiting. 05/29/19   Cardama, Grayce Sessions, MD  metoCLOPramide (REGLAN) 10 MG tablet Take 1 tablet (10 mg total) by mouth every 6 (six) hours as needed for nausea or vomiting. Patient not taking: Reported on 04/23/2019 02/20/18 04/23/19  Shelly Coss, MD  potassium chloride 20 MEQ TBCR Take 20 mEq by mouth 2 (two) times daily. Patient not taking: Reported on 03/31/2019 10/17/18 04/23/19  Isla Pence, MD  potassium chloride SA (KLOR-CON) 20 MEQ tablet Take 1 tablet (20 mEq  total) by mouth daily. Patient not taking: Reported on 03/31/2019 03/14/19 04/23/19  Drenda Freeze, MD    Physical Exam: Vitals:   05/31/19 1806 05/31/19 2000 05/31/19 2030 05/31/19 2200  BP: (!) 143/100 133/87 138/85   Pulse: 99 90 96   Resp: 15 16  18   Temp: 98.3 F (36.8 C)     TempSrc: Oral     SpO2: 98% 98% 100%     Constitutional: NAD, calm, comfortable Eyes: PERRL, lids and conjunctivae normal ENMT: Mucous membranes are moist. Posterior pharynx clear of any exudate or lesions.Normal dentition.  Neck: normal, supple, no masses, no thyromegaly Respiratory: clear to auscultation bilaterally, no wheezing, no crackles. Normal respiratory effort. No accessory muscle use.  Cardiovascular: Regular rate and rhythm, no murmurs / rubs / gallops. No extremity edema. 2+ pedal pulses. No carotid bruits.  Abdomen: no tenderness, no masses palpated. No hepatosplenomegaly. Bowel sounds positive.  Musculoskeletal: no clubbing / cyanosis. No joint deformity upper and lower extremities. Good ROM, no contractures. Normal muscle tone.  Skin: no rashes, lesions, ulcers. No induration Neurologic: CN 2-12 grossly intact. Sensation intact, DTR normal. Strength 5/5 in all 4.  Psychiatric: Normal judgment and insight. Alert and oriented x 3. Normal mood.    Labs on Admission: I have personally reviewed following labs and imaging studies  CBC: Recent Labs  Lab 05/25/19 2158 05/25/19 2158 05/27/19 0522 05/28/19 1339 05/29/19 0136 05/30/19 2010 05/31/19 1922  WBC 3.8*   < > 2.5* 3.7* 3.3* 3.7* 5.2  NEUTROABS 2.2  --  0.9* 2.2  --   --  2.9  HGB 10.3*   < > 9.6* 10.1* 9.3* 11.3* 11.3*  HCT 32.7*   < > 29.0* 30.5* 27.9* 35.9* 35.1*  MCV 85.8   < > 84.1 84.0 83.5 86.5 85.6  PLT 168   < > 185 220 214 297 333   < > = values in this interval not displayed.   Basic Metabolic Panel: Recent Labs  Lab 05/27/19 0522 05/28/19 1339 05/29/19 0136 05/30/19 2010 05/31/19 1922  NA 138 134* 135  143 134*  K 3.9 3.0* 3.1* 3.0* 3.3*  CL 93* 91* 95* 95* 92*  CO2 29 29 25 27 31   GLUCOSE 478* 498* 346* 353* 466*  BUN 20 19 17 11 10   CREATININE 1.52* 1.24* 0.92 1.22* 0.99  CALCIUM 8.7*  8.2* 7.9* 8.9 9.0  MG  --   --   --  1.8  --    GFR: Estimated Creatinine Clearance: 78.1 mL/min (by C-G formula based on SCr of 0.99 mg/dL). Liver Function Tests: Recent Labs  Lab 05/27/19 0522 05/28/19 1339 05/29/19 0136 05/30/19 2010 05/31/19 1922  AST 43* 38 33 37 21  ALT 38 45* 36 40 33  ALKPHOS 77 83 68 94 84  BILITOT 0.8 0.7 0.7 1.1 0.5  PROT 6.6 7.9 6.5 8.3* 7.8  ALBUMIN 2.9* 3.5 2.9* 3.9 3.5   Recent Labs  Lab 05/27/19 0522 05/28/19 1339 05/29/19 0136 05/30/19 2010 05/31/19 1922  LIPASE 17 19 17 18 23    No results for input(s): AMMONIA in the last 168 hours. Coagulation Profile: No results for input(s): INR, PROTIME in the last 168 hours. Cardiac Enzymes: No results for input(s): CKTOTAL, CKMB, CKMBINDEX, TROPONINI in the last 168 hours. BNP (last 3 results) No results for input(s): PROBNP in the last 8760 hours. HbA1C: No results for input(s): HGBA1C in the last 72 hours. CBG: Recent Labs  Lab 05/27/19 0932 05/28/19 1347 05/28/19 1551 05/28/19 1815  GLUCAP 183* 452* 340* 188*   Lipid Profile: No results for input(s): CHOL, HDL, LDLCALC, TRIG, CHOLHDL, LDLDIRECT in the last 72 hours. Thyroid Function Tests: No results for input(s): TSH, T4TOTAL, FREET4, T3FREE, THYROIDAB in the last 72 hours. Anemia Panel: No results for input(s): VITAMINB12, FOLATE, FERRITIN, TIBC, IRON, RETICCTPCT in the last 72 hours. Urine analysis:    Component Value Date/Time   COLORURINE YELLOW 05/31/2019 1922   APPEARANCEUR CLEAR 05/31/2019 1922   LABSPEC 1.014 05/31/2019 1922   PHURINE 6.0 05/31/2019 1922   GLUCOSEU >=500 (A) 05/31/2019 1922   HGBUR MODERATE (A) 05/31/2019 1922   BILIRUBINUR NEGATIVE 05/31/2019 1922   BILIRUBINUR negative 01/02/2015 1717   KETONESUR 5 (A)  05/31/2019 1922   PROTEINUR 100 (A) 05/31/2019 1922   UROBILINOGEN 0.2 01/13/2015 0223   NITRITE NEGATIVE 05/31/2019 1922   LEUKOCYTESUR NEGATIVE 05/31/2019 1922    Radiological Exams on Admission: CT Renal Stone Study  Result Date: 05/31/2019 CLINICAL DATA:  29 year old female with nausea vomiting and flank pain. Kidney stone suspected. EXAM: CT ABDOMEN AND PELVIS WITHOUT CONTRAST TECHNIQUE: Multidetector CT imaging of the abdomen and pelvis was performed following the standard protocol without IV contrast. COMPARISON:  CT of the abdomen pelvis dated 10/21/2018. FINDINGS: Evaluation of this exam is limited in the absence of intravenous contrast. Lower chest: The visualized lung bases are clear. No intra-abdominal free air. There is diffuse mesenteric edema and small free fluid within the pelvis. Hepatobiliary: The liver is unremarkable. Cholecystectomy. No retained calcified stone noted in the central CBD. Pancreas: Unremarkable. No pancreatic ductal dilatation or surrounding inflammatory changes. Spleen: Normal in size without focal abnormality. Adrenals/Urinary Tract: The adrenal glands are unremarkable. There is no hydronephrosis or nephrolithiasis on either side. The visualized ureters and urinary bladder appear unremarkable. Stomach/Bowel: There is mild thickened appearance of the colon concerning for mild colitis. Clinical correlation is recommended. There is no bowel obstruction. The appendix is normal. Vascular/Lymphatic: The abdominal aorta and IVC are unremarkable. No portal venous gas. There is no adenopathy. Reproductive: Mild diffuse subcu scar a stat the uterus and ovaries are grossly unremarkable. Other: Mild diffuse subcutaneous edema. Musculoskeletal: No acute or significant osseous findings. IMPRESSION: 1. Findings concerning for mild colitis. Clinical correlation is recommended. No bowel obstruction. Normal appendix. 2. No hydronephrosis or nephrolithiasis. Electronically Signed   By:  Anner Crete  M.D.   On: 05/31/2019 21:18    EKG: Independently reviewed.  Assessment/Plan Principal Problem:   Intractable nausea and vomiting Active Problems:   Chronic abdominal pain   Hypertension associated with diabetes ([place])   Diabetes mellitus type 1 ([place])   Diabetic gastroparesis associated with type 1 diabetes mellitus ([facility])   Cyclical vomiting syndrome   Cannabinoid hyperemesis syndrome   COVID-19 virus infection    1. Intractable N/V - 1. Due to combination of cannabinoid hyperemesis syndrome and/or diabetic gastroparesis. 2. COVID-19 may also be contributing 3. Scheduled reglan 4. PRN haldol for N/V 1. Tele monitor 5. PRN zofran if haldol doesn't work 6. Discussed need to stop smoking cannabis 7. NPO until symptoms improve 8. capsacin PRN abd pain 9. Avoid narcotics as these will just further delay gastric transit. 10. Repeat labs in AM 11. IVF: LR at 100 2. COVID-19 1. Remdesivir per pharm 2. No respiratory symptoms 3. DM1 - 1. Resume Lantus, will put on 10u QHS 2. Novolog 9u NOW for CBG 466 3. No anion gap today and only 5 keytones in urine currently thankfully (had anion gap in ED yesterday). 4. Sensitive SSI Q4H 4. HTN - 1. Cont home BP meds 2. But will hold diuretics due to N/V 5. Hypokalemia - 1. Replace K  DVT prophylaxis: Lovenox Code Status: Full Family Communication: No family in room Disposition Plan: Home after admit Consults called: None Admission status: Place in 2    Alajiah Dutkiewicz, [place] [facility]  How to contact the [facility] Attending or Consulting provider [place] or covering provider during after hours [place], for this patient?  1. Check the care team in [facility] and look for a) attending/consulting [facility] provider listed and b) the [facility] team listed 2. Log into [email or web address]  Amion Physician Scheduling and messaging for groups and whole hospitals  On call and physician scheduling software for group practices, residents,  hospitalists and other medical providers for call, clinic, rotation and shift schedules. On[employer] is a hospital-wide system for scheduling doctors and paging doctors on call. EasyPlot is for scientific plotting and data analysis.  [email or web address]  and use [place]'s universal password to access. If you do not have the password, please contact the hospital operator.  3. Locate the [facility] provider you are looking for under [facility] and page to a number that you can be directly reached. 4. If you still have difficulty reaching the provider, please page the [facility] (Director on Call) for the Hospitalists listed on amion for assistance.  05/31/2019, 10:13 PM

## 2019-05-31 NOTE — ED Provider Notes (Signed)
[facility] DEPT Provider Note   CSN: [id number] Arrival date & time: 05/31/19  1733     History Chief Complaint  Patient presents with  . Emesis    [name] is a 29 y.o. female.  HPI     Patient presents with nausea, vomiting, back pain, abdominal discomfort. She is has been seen and evaluated multiple times recently including yesterday.  She notes that she was better on discharge, but today, earlier in the day without clear precipitant she began feeling worse again, in a similar pattern as she has on multiple prior occasions.  She has been unable to tolerate any oral intake for relief, and with progression of discomfort, pain in her lower back described as sore, tight, she presents for evaluation.   Past Medical History:  Diagnosis Date  . Allergy   . Anemia   . Anxiety   . Blood transfusion without reported diagnosis    Dec 2018  . Cataract    right eye  . COVID-19   . Depression   . Diabetes type 1, uncontrolled ([place]) 11/14/2011   Since age 82  . Fibromyalgia   . Gastroparesis   . GERD (gastroesophageal reflux disease)   . Hypertension   . Infection    UTI April 2016    Patient Active Problem List   Diagnosis Date Noted  . Nausea & vomiting 04/30/2019  . Hypertensive urgency 04/30/2019  . Intractable nausea and vomiting 04/30/2019  . Contraception management 08/06/2018  . Cyclical vomiting syndrome 06/07/2018  . Diabetic gastroparesis associated with type 1 diabetes mellitus ([place]) 06/04/2018  . Anxiety 05/31/2018  . Peripheral edema 05/31/2018  . Normocytic anemia 07/10/2017  . GERD (gastroesophageal reflux disease) 06/23/2017  . 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 Diabetes mellitus type 1 ([place]) 04/28/2013  . Protein-calorie malnutrition, severe ([place]) 04/28/2013  . Hypertension associated with diabetes ([place]) 11/14/2011  . Chronic abdominal pain 09/18/2011    Past Surgical  History:  Procedure Laterality Date  . ANKLE SURGERY    . CHOLECYSTECTOMY  11/15/2011   Procedure: LAPAROSCOPIC CHOLECYSTECTOMY WITH INTRAOPERATIVE CHOLANGIOGRAM;  Surgeon: [name], MD;  Location: [facility];  Service: General;  Laterality: N/A;  . COLONOSCOPY    . COLONOSCOPY WITH PROPOFOL N/A 06/27/2017   Procedure: COLONOSCOPY WITH PROPOFOL;  Surgeon: Milus Banister, MD;  Location: [facility];  Service: Endoscopy;  Laterality: N/A;  . ESOPHAGOGASTRODUODENOSCOPY  12/03/2011   Procedure: ESOPHAGOGASTRODUODENOSCOPY (EGD);  Surgeon: Beryle Beams, MD;  Location: [facility];  Service: Endoscopy;  Laterality: N/A;  . FLEXIBLE SIGMOIDOSCOPY N/A 03/10/2017   Procedure: FLEXIBLE SIGMOIDOSCOPY;  Surgeon: Carol Ada, MD;  Location: [facility];  Service: Endoscopy;  Laterality: N/A;  . INCISION AND DRAINAGE PERIRECTAL ABSCESS N/A 03/01/2017   Procedure: IRRIGATION AND DEBRIDEMENT PERIRECTAL ABSCESS;  Surgeon: Alphonsa Overall, MD;  Location: [facility];  Service: General;  Laterality: N/A;  . IRRIGATION AND DEBRIDEMENT BUTTOCKS N/A 03/23/2017   Procedure: IRRIGATION AND DEBRIDEMENT BUTTOCKS, SETON PLACEMENT;  Surgeon: [name], MD;  Location: [facility];  Service: General;  Laterality: N/A;  . LAPAROSCOPY  11/23/2011   Procedure: LAPAROSCOPY DIAGNOSTIC;  Surgeon: Edward Jolly, MD;  Location: [facility];  Service: General;  Laterality: N/A;  . SIGMOIDOSCOPY    . UPPER GASTROINTESTINAL ENDOSCOPY    . WISDOM TOOTH EXTRACTION       OB History    Gravida  1   Para  1   Term  Preterm  1   AB      Living  1     SAB      TAB      Ectopic      Multiple  0   Live Births  1           Family History  Problem Relation Age of Onset  . Diabetes Mother   . Hypertension Father   . Colon cancer Paternal Grandmother        pt thinks PGM was dx in her 37's  . Diabetes Paternal Grandmother   . Diabetes Maternal Grandmother   . Diabetes Maternal Grandfather   . Diabetes  Paternal Grandfather   . Diabetes Other   . Esophageal cancer Neg Hx   . Liver cancer Neg Hx   . Pancreatic cancer Neg Hx   . Stomach cancer Neg Hx   . Rectal cancer Neg Hx     Social History   Tobacco Use  . Smoking status: Never Smoker  . Smokeless tobacco: Never Used  Substance Use Topics  . Alcohol use: No    Alcohol/week: 0.0 standard drinks  . Drug use: Yes    Types: Marijuana    Home Medications Prior to Admission medications   Medication Sig Start Date End Date Taking? Authorizing Provider  cyclobenzaprine (FLEXERIL) 10 MG tablet TAKE 1 TABLET(10 MG) BY MOUTH THREE TIMES DAILY AS NEEDED FOR MUSCLE SPASMS Patient taking differently: Take 10 mg by mouth 3 (three) times daily as needed for muscle spasms.  04/29/19  Yes [name], MD  dicyclomine (BENTYL) 20 MG tablet Take 1 tablet (20 mg total) by mouth 3 (three) times daily as needed for spasms. 05/01/19  Yes [name], MD  furosemide (LASIX) 40 MG tablet TAKE 1 TABLET BY MOUTH  DAILY Patient taking differently: Take 40 mg by mouth daily.  11/11/18  Yes Armbruster, Carlota Raspberry, MD  hydrALAZINE (APRESOLINE) 50 MG tablet Take 1 tablet (50 mg total) by mouth every 6 (six) hours. Patient taking differently: Take 50 mg by mouth 2 (two) times daily.  10/23/18  Yes Swayze, Ava, DO  ibuprofen (ADVIL) 200 MG tablet Take 400 mg by mouth every 6 (six) hours as needed for moderate pain.   Yes [provider]  Insulin Glargine (LANTUS SOLOSTAR) 100 UNIT/ML Solostar Pen Inject 10 Units into the skin at bedtime. And pen needles 4/day Patient taking differently: Inject 12 Units into the skin at bedtime. And pen needles 4/day 12/31/18  Yes Renato Shin, MD  insulin lispro (HUMALOG KWIKPEN) 100 UNIT/ML KwikPen Inject 0.06 mLs (6 Units total) into the skin 3 (three) times daily. Patient taking differently: Inject 1-9 Units into the skin 3 (three) times daily. Per sliding scale 12/31/18  Yes Renato Shin, MD  loperamide  (IMODIUM) 2 MG capsule Take 6 mg by mouth as needed for diarrhea or loose stools.   Yes [provider]  LORazepam (ATIVAN) 0.5 MG tablet Take 1 tablet (0.5 mg total) by mouth 2 (two) times daily as needed for anxiety. 04/05/19  Yes [name], MD  metoprolol tartrate (LOPRESSOR) 25 MG tablet Take 1 tablet (25 mg total) by mouth 2 (two) times daily. 05/22/19 06/21/19 Yes Arrien, Jimmy Picket, MD  mirtazapine (REMERON) 30 MG tablet TAKE 1 TABLET BY MOUTH AT  BEDTIME Patient taking differently: Take 30 mg by mouth at bedtime.  04/19/19  Yes [name], MD  ondansetron (ZOFRAN) 4 MG tablet Take 1 tablet (4 mg  total) by mouth every 8 (eight) hours as needed for nausea or vomiting. 05/22/19  Yes Arrien, Jimmy Picket, MD  OVER THE COUNTER MEDICATION Take 2 each by mouth daily. Flo Chewable Gummies for period   Yes [provider]  pantoprazole (PROTONIX) 40 MG tablet TAKE 1 TABLET BY MOUTH  DAILY Patient taking differently: Take 40 mg by mouth daily.  10/22/18  Yes Armbruster, Carlota Raspberry, MD  Continuous Blood Gluc Receiver ([place]) [place] See admin instructions. USE AS DIRECTED TO MONITOR GLUCOSE LEVELS 10/29/18   [provider]  Continuous Blood Gluc Sensor (DEXCOM G6 SENSOR) MISC 1 each by Does not apply route See admin instructions. every 10 days. 12/31/18   Renato Shin, MD  Continuous Blood Gluc Transmit (DEXCOM G6 TRANSMITTER) MISC See admin instructions. USE AS DIRECTED TO MONITOR GLUCOSE LEVELS 10/29/18   [provider]  promethazine (PHENERGAN) 25 MG suppository Place 1 suppository (25 mg total) rectally every 6 (six) hours as needed for nausea or vomiting. 05/28/19   Rancour, Annie Main, MD  promethazine (PHENERGAN) 25 MG suppository Place 1 suppository (25 mg total) rectally every 6 (six) hours as needed for nausea or vomiting. 05/29/19   Cardama, Grayce Sessions, MD  metoCLOPramide (REGLAN) 10 MG tablet Take 1 tablet (10 mg total) by mouth every 6  (six) hours as needed for nausea or vomiting. Patient not taking: Reported on 04/23/2019 02/20/18 04/23/19  Shelly Coss, MD  potassium chloride 20 MEQ TBCR Take 20 mEq by mouth 2 (two) times daily. Patient not taking: Reported on 03/31/2019 10/17/18 04/23/19  Isla Pence, MD  potassium chloride SA (KLOR-CON) 20 MEQ tablet Take 1 tablet (20 mEq total) by mouth daily. Patient not taking: Reported on 03/31/2019 03/14/19 04/23/19  Drenda Freeze, MD    Allergies    Other and Lactose intolerance (gi)  Review of Systems   Review of Systems  Constitutional:       Per HPI, otherwise negative  HENT:       Per HPI, otherwise negative  Respiratory:       Per HPI, otherwise negative  Cardiovascular:       Per HPI, otherwise negative  Gastrointestinal: Positive for abdominal pain, nausea and vomiting.  Endocrine:       Negative aside from HPI  Genitourinary:       Neg aside from HPI   Musculoskeletal:       Per HPI, otherwise negative  Skin: Negative.   Neurological: Negative for syncope.    Physical Exam Updated Vital Signs BP (!) 143/100   Pulse 99   Temp 98.3 F (36.8 C) (Oral)   Resp 15   SpO2 98%   Physical Exam Vitals and nursing note reviewed.  Constitutional:      Appearance: She is well-developed.     Comments: Uncomfortable appearing thin adult female sitting upright.  HENT:     Head: Normocephalic and atraumatic.  Eyes:     Conjunctiva/sclera: Conjunctivae normal.  Cardiovascular:     Rate and Rhythm: Normal rate and regular rhythm.  Pulmonary:     Effort: Pulmonary effort is normal. No respiratory distress.     Breath sounds: Normal breath sounds. No stridor.  Abdominal:     General: There is no distension.     Tenderness: There is no abdominal tenderness. There is no guarding.  Skin:    General: Skin is warm and dry.  Neurological:     Mental Status: She is alert and oriented to person, place,  and time.     Cranial Nerves: No cranial nerve deficit.      ED Results / Procedures / Treatments   Labs (all labs ordered are listed, but only abnormal results are displayed) Labs Reviewed  COMPREHENSIVE METABOLIC PANEL    Procedures Procedures (including critical care time)  Medications Ordered in ED Medications  sodium chloride 0.9 % bolus 1,000 mL (has no administration in time range)  hyoscyamine (LEVSIN) tablet 0.25 mg (has no administration in time range)  prochlorperazine (COMPAZINE) injection 10 mg (has no administration in time range)  ketorolac (TORADOL) 15 MG/ML injection 15 mg (has no administration in time range)    ED Course  I have reviewed the triage vital signs and the nursing notes.  Pertinent labs & imaging results that were available during my care of the patient were reviewed by me and considered in my medical decision making (see chart for details).   After the initial evaluation I reviewed the patient's chart including documentation from yesterday's visit, following which she felt better after intervention with fluids, antiemetics, analgesics.  Update:, Patient continues to have nausea, vomiting.  Initial labs notable for hematuria.  Chart review notable for no recent evaluation with imaging, she notes that she does not typically have hematuria, though labs suggest this has been the case recently.  CT scan ordered.  9:40 PM Patient remains uncomfortable in appearance, I reviewed the CT images with her, concerning for colitis.  Given her persistent nausea, vomiting, concern for colitis, though she does have noted history of chronic nausea, with her inability to tolerate oral intake as well, patient will start antibiotics, continue antibiotics, fluids, will require admission for for moderate for monitoring, management. Notably, patient had coronavirus diagnosed 2 weeks ago; this is likely contributing to her Sx.   Final Clinical Impression(s) / ED Diagnoses Final diagnoses:  Colitis  Bilious vomiting with nausea      Carmin Muskrat, MD 05/31/19 2142

## 2019-05-31 NOTE — ED Triage Notes (Signed)
Patient is experiencing vomiting that has not been relieved since the last visit on 02/28. She did test positive for COVID on 02/20, however, she is not experiencing any COVID symptoms.

## 2019-05-31 NOTE — ED Notes (Signed)
  Pt transported to ct 

## 2019-06-01 ENCOUNTER — Inpatient Hospital Stay (HOSPITAL_COMMUNITY): Payer: 59

## 2019-06-01 DIAGNOSIS — D649 Anemia, unspecified: Secondary | ICD-10-CM | POA: Diagnosis present

## 2019-06-01 DIAGNOSIS — G8929 Other chronic pain: Secondary | ICD-10-CM | POA: Diagnosis present

## 2019-06-01 DIAGNOSIS — Z8249 Family history of ischemic heart disease and other diseases of the circulatory system: Secondary | ICD-10-CM | POA: Diagnosis not present

## 2019-06-01 DIAGNOSIS — E1069 Type 1 diabetes mellitus with other specified complication: Secondary | ICD-10-CM

## 2019-06-01 DIAGNOSIS — Z91018 Allergy to other foods: Secondary | ICD-10-CM | POA: Diagnosis not present

## 2019-06-01 DIAGNOSIS — U071 COVID-19: Secondary | ICD-10-CM | POA: Diagnosis present

## 2019-06-01 DIAGNOSIS — E876 Hypokalemia: Secondary | ICD-10-CM

## 2019-06-01 DIAGNOSIS — R1115 Cyclical vomiting syndrome unrelated to migraine: Secondary | ICD-10-CM | POA: Diagnosis not present

## 2019-06-01 DIAGNOSIS — Z79899 Other long term (current) drug therapy: Secondary | ICD-10-CM | POA: Diagnosis not present

## 2019-06-01 DIAGNOSIS — E739 Lactose intolerance, unspecified: Secondary | ICD-10-CM | POA: Diagnosis present

## 2019-06-01 DIAGNOSIS — E1043 Type 1 diabetes mellitus with diabetic autonomic (poly)neuropathy: Secondary | ICD-10-CM | POA: Diagnosis present

## 2019-06-01 DIAGNOSIS — R112 Nausea with vomiting, unspecified: Secondary | ICD-10-CM | POA: Diagnosis present

## 2019-06-01 DIAGNOSIS — K3184 Gastroparesis: Secondary | ICD-10-CM | POA: Diagnosis present

## 2019-06-01 DIAGNOSIS — F1290 Cannabis use, unspecified, uncomplicated: Secondary | ICD-10-CM | POA: Diagnosis present

## 2019-06-01 DIAGNOSIS — R109 Unspecified abdominal pain: Secondary | ICD-10-CM | POA: Diagnosis not present

## 2019-06-01 DIAGNOSIS — I152 Hypertension secondary to endocrine disorders: Secondary | ICD-10-CM | POA: Diagnosis present

## 2019-06-01 DIAGNOSIS — Z794 Long term (current) use of insulin: Secondary | ICD-10-CM | POA: Diagnosis not present

## 2019-06-01 DIAGNOSIS — Z833 Family history of diabetes mellitus: Secondary | ICD-10-CM | POA: Diagnosis not present

## 2019-06-01 DIAGNOSIS — E10649 Type 1 diabetes mellitus with hypoglycemia without coma: Secondary | ICD-10-CM | POA: Diagnosis not present

## 2019-06-01 DIAGNOSIS — E1065 Type 1 diabetes mellitus with hyperglycemia: Secondary | ICD-10-CM | POA: Diagnosis present

## 2019-06-01 DIAGNOSIS — T407X1A Poisoning by cannabis (derivatives), accidental (unintentional), initial encounter: Secondary | ICD-10-CM | POA: Diagnosis present

## 2019-06-01 LAB — COMPREHENSIVE METABOLIC PANEL
ALT: 26 U/L (ref 0–44)
AST: 23 U/L (ref 15–41)
Albumin: 2.8 g/dL — ABNORMAL LOW (ref 3.5–5.0)
Alkaline Phosphatase: 66 U/L (ref 38–126)
Anion gap: 10 (ref 5–15)
BUN: 10 mg/dL (ref 6–20)
CO2: 26 mmol/L (ref 22–32)
Calcium: 7.9 mg/dL — ABNORMAL LOW (ref 8.9–10.3)
Chloride: 101 mmol/L (ref 98–111)
Creatinine, Ser: 0.87 mg/dL (ref 0.44–1.00)
GFR calc Af Amer: >60 mL/min (ref >60)
GFR calc non Af Amer: >60 mL/min (ref >60)
Glucose, Bld: 184 mg/dL — ABNORMAL HIGH (ref 70–99)
Potassium: 3.3 mmol/L — ABNORMAL LOW (ref 3.5–5.1)
Sodium: 137 mmol/L (ref 135–145)
Total Bilirubin: 0.7 mg/dL (ref 0.3–1.2)
Total Protein: 6.4 g/dL — ABNORMAL LOW (ref 6.5–8.1)

## 2019-06-01 LAB — CBC
HCT: 30 % — ABNORMAL LOW (ref 36.0–46.0)
Hemoglobin: 9.5 g/dL — ABNORMAL LOW (ref 12.0–15.0)
MCH: 27.5 pg (ref 26.0–34.0)
MCHC: 31.7 g/dL (ref 30.0–36.0)
MCV: 87 fL (ref 80.0–100.0)
Platelets: 241 10*3/uL (ref 150–400)
RBC: 3.45 MIL/uL — ABNORMAL LOW (ref 3.87–5.11)
RDW: 13.2 % (ref 11.5–15.5)
WBC: 6.3 10*3/uL (ref 4.0–10.5)
nRBC: 0 % (ref 0.0–0.2)

## 2019-06-01 LAB — GLUCOSE, CAPILLARY
Glucose-Capillary: 107 mg/dL — ABNORMAL HIGH (ref 70–99)
Glucose-Capillary: 109 mg/dL — ABNORMAL HIGH (ref 70–99)
Glucose-Capillary: 133 mg/dL — ABNORMAL HIGH (ref 70–99)
Glucose-Capillary: 141 mg/dL — ABNORMAL HIGH (ref 70–99)
Glucose-Capillary: 157 mg/dL — ABNORMAL HIGH (ref 70–99)
Glucose-Capillary: 45 mg/dL — ABNORMAL LOW (ref 70–99)
Glucose-Capillary: 62 mg/dL — ABNORMAL LOW (ref 70–99)
Glucose-Capillary: 64 mg/dL — ABNORMAL LOW (ref 70–99)
Glucose-Capillary: 83 mg/dL (ref 70–99)
Glucose-Capillary: 98 mg/dL (ref 70–99)

## 2019-06-01 LAB — CBG MONITORING, ED: Glucose-Capillary: 277 mg/dL — ABNORMAL HIGH (ref 70–99)

## 2019-06-01 IMAGING — DX DG CHEST 1V PORT
1 series · 1 of 1 positions shown · non-contrast
Comparison: Portable exam [KN] hours compared to [DATE]

CLINICAL DATA: [KN] detected

EXAM:
PORTABLE CHEST 1 VIEW

[chest ap]
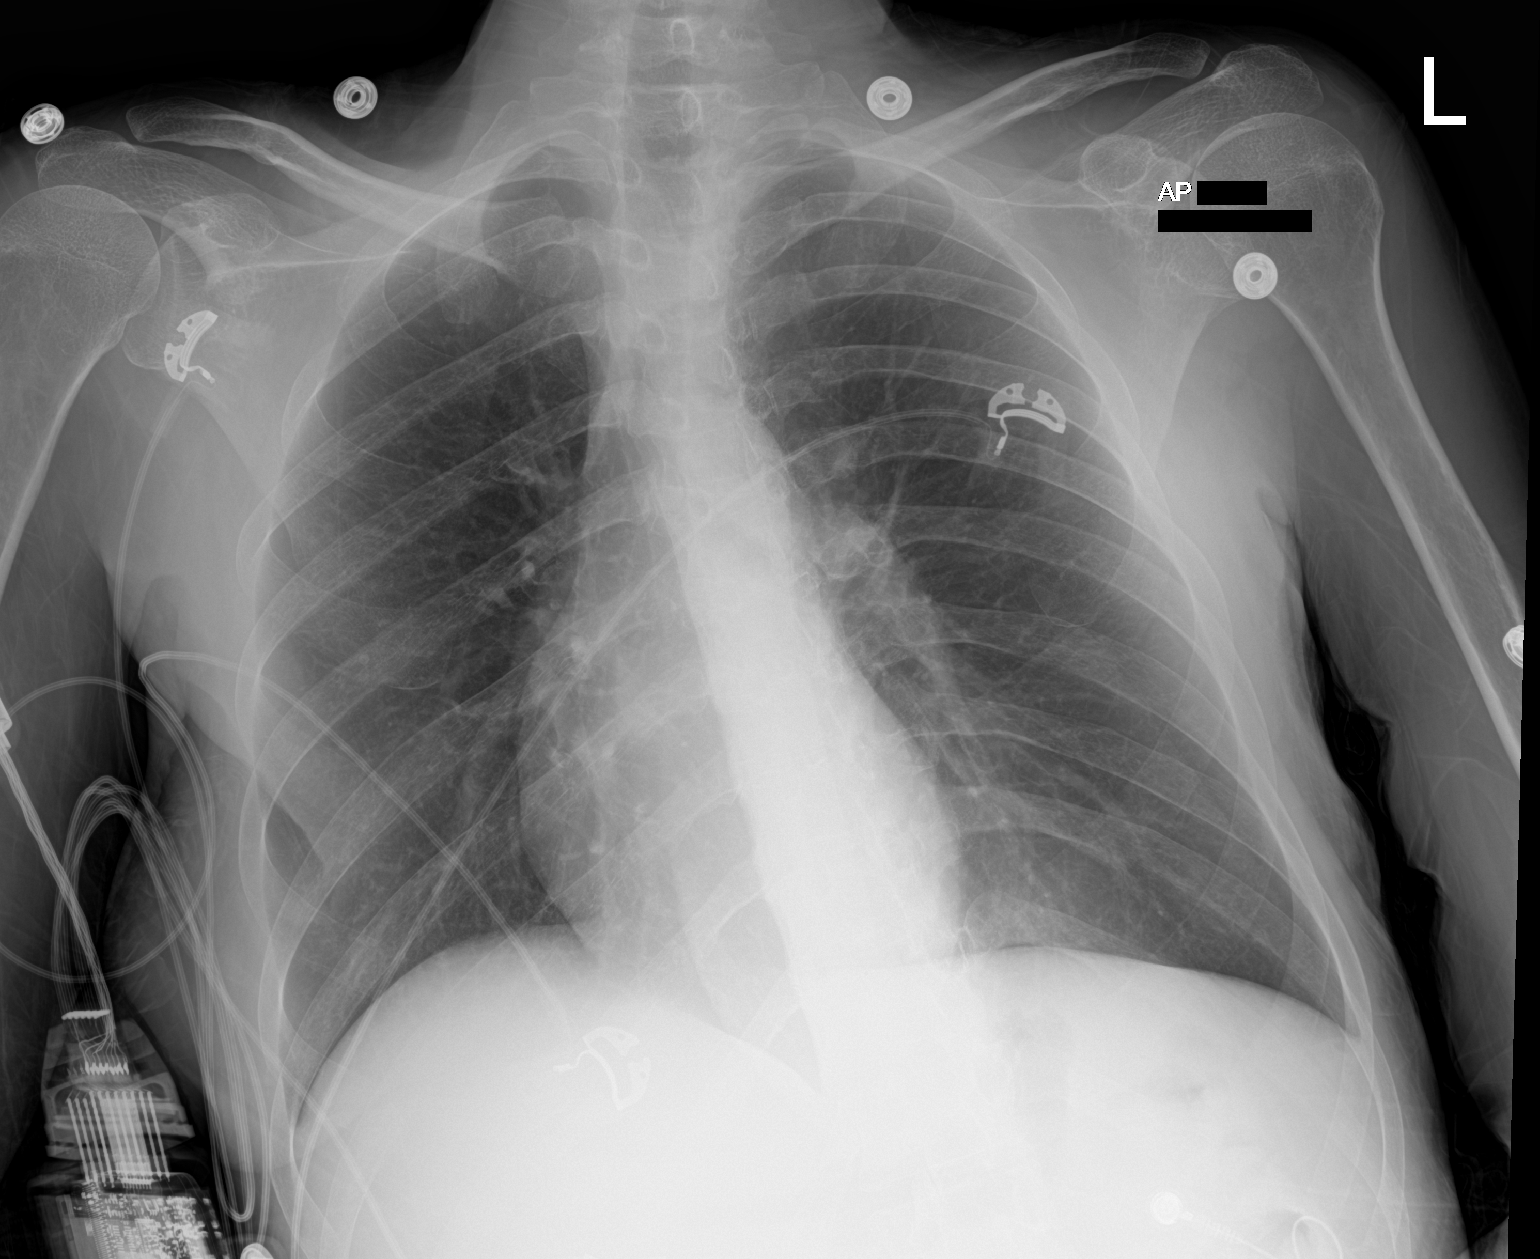

[1 of 1 positions shown; findings below may reference images not displayed]

FINDINGS: Slight rotation to the RIGHT.

Normal heart size, mediastinal contours, and pulmonary vascularity.

Lungs clear.

No pulmonary infiltrate, pleural effusion or pneumothorax.

Osseous structures unremarkable.
IMPRESSION: No acute abnormalities.

## 2019-06-01 MED ORDER — PANTOPRAZOLE SODIUM 40 MG IV SOLR
40.0000 mg | INTRAVENOUS | Status: DC
  Administered 2019-06-01: 40 mg via INTRAVENOUS
  Filled 2019-06-01: qty 40

## 2019-06-01 MED ORDER — INSULIN GLARGINE 100 UNIT/ML ~~LOC~~ SOLN
5.0000 [IU] | Freq: Every day | SUBCUTANEOUS | Status: DC
  Administered 2019-06-01 – 2019-06-02 (×2): 5 [IU] via SUBCUTANEOUS
  Filled 2019-06-01 (×3): qty 0.05

## 2019-06-01 MED ORDER — PROMETHAZINE HCL 25 MG/ML IJ SOLN
12.5000 mg | Freq: Four times a day (QID) | INTRAMUSCULAR | Status: DC
  Administered 2019-06-01 – 2019-06-02 (×5): 12.5 mg via INTRAVENOUS
  Filled 2019-06-01 (×5): qty 1

## 2019-06-01 MED ORDER — KETOROLAC TROMETHAMINE 15 MG/ML IJ SOLN
15.0000 mg | Freq: Once | INTRAMUSCULAR | Status: AC
  Administered 2019-06-01: 15 mg via INTRAVENOUS
  Filled 2019-06-01: qty 1

## 2019-06-01 MED ORDER — KCL IN DEXTROSE-NACL 10-5-0.45 MEQ/L-%-% IV SOLN: INTRAVENOUS | Status: DC

## 2019-06-01 MED ORDER — MORPHINE SULFATE (PF) 2 MG/ML IV SOLN
2.0000 mg | Freq: Once | INTRAVENOUS | Status: AC
  Administered 2019-06-01: 2 mg via INTRAVENOUS
  Filled 2019-06-01: qty 1

## 2019-06-01 MED ORDER — IBUPROFEN 200 MG PO TABS
400.0000 mg | ORAL_TABLET | Freq: Once | ORAL | Status: DC
  Filled 2019-06-01: qty 2

## 2019-06-01 MED ORDER — DEXTROSE 50 % IV SOLN
INTRAVENOUS | Status: AC
  Administered 2019-06-01: 25 mL
  Filled 2019-06-01: qty 50

## 2019-06-01 MED ORDER — DEXTROSE 50 % IV SOLN
INTRAVENOUS | Status: AC
  Administered 2019-06-01 (×2): 25 mL
  Filled 2019-06-01: qty 50

## 2019-06-01 MED ORDER — KCL IN DEXTROSE-NACL 10-5-0.45 MEQ/L-%-% IV SOLN
INTRAVENOUS | Status: DC
  Filled 2019-06-01 (×4): qty 1000

## 2019-06-01 NOTE — Progress Notes (Signed)
[facility]  PROGRESS NOTE  [name] JGG:[id number] DOB: [birth year] DOA: 05/31/2019 PCP: [name], MD Admit date - 05/31/2019   Admitting Physician Desiree Hane, MD  Outpatient Primary MD for the patient is [name], MD  LOS - 0 Brief Narrative   [name] is a 29 y.o. year old female with medical history significant for poorly controlled type 1 diabetes (with previous history of DKA), chronic anasarca/ascites due to malnutrition, severe gastroparesis for several years with recurrent admissions for intractable/cyclical nausea/vomiting who presented on 05/31/2019 with acute on chronic nausea/vomiting and abdominal pain.  Hospital course complicated by persistent hypoglycemic episodes, inability to tolerate oral intake related to persistent vomiting.  Subjective  Today she has no appetite. Vomited this am. Had normal stool this am. Has persistent back pain.   A & P  Intractable cyclical nausea and vomiting in poorly controlled type I diabetic, likely acute on chronic gastroparesis and/or cannabinoid hyperemesis syndrome.  Patient still unable to tolerate any oral intake with persistent nausea/vomiting, and having persistent hypoglycemia episodes requiring D50 ampules for resolution.  Patient has complex, long history of gastroparesis flares that do not typically respond to Reglan and has required scheduled IV promotility agents.   -switch oral PPI to IV PPI line -Continue bowel rest/n.p.o. -Continue topical capsaicin, Flexeril/Bentyl as needed -Daily EKG, monitor QTC -Continue Reglan for now but if no significant improvement will change to scheduled IV erythromycin which has worked well in the past -Schedule IV Phenergan, as needed Zofran -Can consider scheduled IV Ativan in 24 to 48 hours if symptoms continue to worsen Avoid opioids/pain medication due to high potential for exacerbating primary underlying GI issue -Patient counseled on cessation of marijuana  use, admits that she is decreased her frequency if clinically does not improve with supportive measures above warrant GI consultation  Type 1 diabetes, poorly controlled.  A1c (04/2019) 7.8. Takes Lantus 10 units at home.  Having recurrent hypoglycemic episodes requiring multiple D50 ampules - while n.p.o. will decrease Lantus to 5 units, and maintain sliding scale per n.p.o. protocol -Add D5 half-normal saline infusion x16 hours, reassess in a.m., close monitoring of CBGs  Covid -19 infection Diagnosed on 05/21/2019 BS screening test prior to hospitalization at that time. Patient remains afebrile, not hypoxic. Doesn't seem to clinically need the remdesivir started on admission -if CXR is unremarkable and continues to have no hypoxia or respiratory symptoms will discontinue  Hypokalemia, persistent in the setting of recurrent vomiting episodes IV potassium repletion and D5 infusion -Daily BMP  Chronic normocytic anemia  Hemoglobin stable at baseline, no bleeding -Monitor CBC  Hypertension, stable -Hydralazine 50 mg every 6 hours     Family Communication  :  No family update  Code Status :  FULL code  Disposition Plan  :  Patient is from home. Anticipated d/c date: 2 to 3 days. Barriers to d/c and necessity for inpatient status:  Currently unable to tolerate p.o. and had recurrent hypoglycemia in the setting of intractable nausea/vomiting.  Optimizing promotility regimen, with close monitoring, if unable to improve may warrant GI consultation Consults  : None  Procedures  : None   DVT Prophylaxis  :  Lovenox   Lab Results  Component Value Date   PLT 241 06/01/2019    Diet :  Diet Order            Diet NPO time specified Except for: Ice Chips, Sips with Meds  Diet effective now  Inpatient Medications Scheduled Meds: . enoxaparin (LOVENOX) injection  40 mg Subcutaneous Q24H  . hydrALAZINE  50 mg Oral BID  . insulin aspart  0-9 Units Subcutaneous Q4H  .  insulin glargine  10 Units Subcutaneous QHS  . metoCLOPramide (REGLAN) injection  10 mg Intravenous Q8H  . metoprolol tartrate  25 mg Oral BID  . mirtazapine  30 mg Oral QHS  .  morphine injection  2 mg Intravenous Once  . pantoprazole  40 mg Oral Daily   Continuous Infusions: . lactated ringers 100 mL/hr at 06/01/19 0921  . [START ON 06/02/2019] remdesivir 100 mg in NS 100 mL 100 mg (06/01/19 0148)   PRN Meds:.acetaminophen **OR** acetaminophen, capsaicin, cyclobenzaprine, dicyclomine, haloperidol lactate, LORazepam, ondansetron **OR** ondansetron (ZOFRAN) IV  Antibiotics  :   Anti-infectives (From admission, onward)   Start     Dose/Rate Route Frequency Ordered Stop   06/02/19 1000  remdesivir 100 mg in sodium chloride 0.9 % 100 mL IVPB  Status:  Discontinued     100 mg 200 mL/hr over 30 Minutes Intravenous Daily 05/31/19 2159 05/31/19 2201   06/02/19 1000  remdesivir 100 mg in sodium chloride 0.9 % 100 mL IVPB     100 mg 200 mL/hr over 30 Minutes Intravenous Daily 05/31/19 2202 06/06/19 0959   05/31/19 2230  remdesivir 200 mg in sodium chloride 0.9% 250 mL IVPB  Status:  Discontinued     200 mg 580 mL/hr over 30 Minutes Intravenous Once 05/31/19 2159 05/31/19 2201   05/31/19 2230  remdesivir 200 mg in sodium chloride 0.9% 250 mL IVPB     100 mg 540 mL/hr over 30 Minutes Intravenous Every 1 hr x 2 05/31/19 2202 06/01/19 0029   05/31/19 2145  ciprofloxacin (CIPRO) IVPB 400 mg  Status:  Discontinued     400 mg 200 mL/hr over 60 Minutes Intravenous  Once 05/31/19 2139 05/31/19 2147   05/31/19 2145  metroNIDAZOLE (FLAGYL) IVPB 500 mg  Status:  Discontinued     500 mg 100 mL/hr over 60 Minutes Intravenous  Once 05/31/19 2139 05/31/19 2147       Objective   Vitals:   06/01/19 0030 06/01/19 0230 06/01/19 0236 06/01/19 0419  BP: (!) 147/93 (!) 151/104  (!) 137/103  Pulse: 89 86  93  Resp: 12 18  20   Temp:  97.9 F (36.6 C)  97.8 F (36.6 C)  TempSrc:  Oral  Oral  SpO2: 100%  100%  100%  Weight:   56.7 kg   Height:   5' 7"  (1.702 m)     SpO2: 100 %  Wt Readings from Last 3 Encounters:  06/01/19 56.7 kg  05/30/19 59 kg  05/29/19 65 kg     Intake/Output Summary (Last 24 hours) at 06/01/2019 1145 Last data filed at 06/01/2019 0921 Gross per 24 hour  Intake 3880.41 ml  Output 0 ml  Net 3880.41 ml    Physical Exam:  Awake Alert, Oriented X 3, anxious while standing up near bed No new F.N deficits,  [place].AT, Symmetrical Chest wall movement, Good air movement bilaterally on room air CTAB RRR,No Gallops,Rubs or new Murmurs,  Abd Soft, No tenderness,, No rebound, guarding or rigidity. No Cyanosis, Clubbing or edema, No new Rash or bruise     I have personally reviewed the following:   Data Reviewed:  CBC Recent Labs  Lab 05/25/19 2158 05/25/19 2158 05/27/19 0522 05/27/19 0522 05/28/19 1339 05/29/19 0136 05/30/19 2010 05/31/19 1922 06/01/19 0327  WBC 3.8*   < >  2.5*   < > 3.7* 3.3* 3.7* 5.2 6.3  HGB 10.3*   < > 9.6*   < > 10.1* 9.3* 11.3* 11.3* 9.5*  HCT 32.7*   < > 29.0*   < > 30.5* 27.9* 35.9* 35.1* 30.0*  PLT 168   < > 185   < > 220 214 297 333 241  MCV 85.8   < > 84.1   < > 84.0 83.5 86.5 85.6 87.0  MCH 27.0   < > 27.8   < > 27.8 27.8 27.2 27.6 27.5  MCHC 31.5   < > 33.1   < > 33.1 33.3 31.5 32.2 31.7  RDW 12.9   < > 12.9   < > 12.8 13.1 13.2 13.2 13.2  LYMPHSABS 1.2  --  1.4  --  1.2  --   --  1.7  --   MONOABS 0.4  --  0.2  --  0.3  --   --  0.4  --   EOSABS 0.1  --  0.0  --  0.0  --   --  0.1  --   BASOSABS 0.0  --  0.0  --  0.0  --   --  0.0  --    < > = values in this interval not displayed.    Chemistries  Recent Labs  Lab 05/28/19 1339 05/29/19 0136 05/30/19 2010 05/31/19 1922 06/01/19 0327  NA 134* 135 143 134* 137  K 3.0* 3.1* 3.0* 3.3* 3.3*  CL 91* 95* 95* 92* 101  CO2 29 25 27 31 26   GLUCOSE 498* 346* 353* 466* 184*  BUN 19 17 11 10 10   CREATININE 1.24* 0.92 1.22* 0.99 0.87  CALCIUM 8.2* 7.9* 8.9 9.0 7.9*  MG   --   --  1.8  --   --   AST 38 33 37 21 23  ALT 45* 36 40 33 26  ALKPHOS 83 68 94 84 66  BILITOT 0.7 0.7 1.1 0.5 0.7   ------------------------------------------------------------------------------------------------------------------ No results for input(s): CHOL, HDL, LDLCALC, TRIG, CHOLHDL, LDLDIRECT in the last 72 hours.  Lab Results  Component Value Date   HGBA1C 7.8 (H) 04/30/2019   ------------------------------------------------------------------------------------------------------------------ No results for input(s): TSH, T4TOTAL, T3FREE, THYROIDAB in the last 72 hours.  Invalid input(s): FREET3 ------------------------------------------------------------------------------------------------------------------ No results for input(s): VITAMINB12, FOLATE, FERRITIN, TIBC, IRON, RETICCTPCT in the last 72 hours.  Coagulation profile No results for input(s): INR, PROTIME in the last 168 hours.  No results for input(s): DDIMER in the last 72 hours.  Cardiac Enzymes No results for input(s): CKMB, TROPONINI, MYOGLOBIN in the last 168 hours.  Invalid input(s): CK ------------------------------------------------------------------------------------------------------------------ No results found for: BNP  Micro Results Recent Results (from the past 240 hour(s))  Blood Culture (routine x 2)     Status: None   Collection Time: 05/25/19  9:58 PM   Specimen: BLOOD  Result Value Ref Range Status   Specimen Description   Final    BLOOD BLOOD RIGHT FOREARM Performed at [facility] [address] [place], [place] [address]    Special Requests   Final    BOTTLES DRAWN AEROBIC AND ANAEROBIC Blood Culture adequate volume Performed at [facility] [address], [place], [place] [address]    Culture   Final    NO GROWTH 5 DAYS Performed at [facility], [place] [address], [place], [place] [address]    Report Status 05/31/2019 FINAL  Final  Radiology Reports DG Chest Port 1 View  Result Date: 05/25/2019 CLINICAL DATA:  Shortness of breath, back pain, diabetic, COVID EXAM: PORTABLE CHEST 1 VIEW COMPARISON:  Radiograph 03/13/2019 FINDINGS: No consolidation, features of edema, pneumothorax, or effusion. Pulmonary vascularity is normally distributed. The cardiomediastinal contours are unremarkable. No acute osseous or soft tissue abnormality. Cholecystectomy clips present in the right upper quadrant. IMPRESSION: No acute cardiopulmonary abnormality. Electronically Signed   By: Lovena Le M.D.   On: 05/25/2019 22:31   DG ABD ACUTE 2+V W 1V CHEST  Result Date: 05/28/2019 CLINICAL DATA:  30 year old female with abdominal pain and vomiting EXAM: DG ABDOMEN ACUTE W/ 1V CHEST COMPARISON:  03/14/2019 FINDINGS: Chest: Cardiomediastinal silhouette within normal limits. No pneumothorax pleural effusion or confluent airspace disease. Abdomen: Paucity of gas within small bowel colon. No distention. No air-fluid levels. No unexpected radiopaque foreign body, soft tissue density, or calcification. No displaced fracture. IMPRESSION: Chest: No radiographic evidence of acute cardiopulmonary disease. Abdomen: Nonobstructive bowel gas pattern. Electronically Signed   By: Corrie Mckusick D.O.   On: 05/28/2019 15:14   CT Renal Stone Study  Result Date: 05/31/2019 CLINICAL DATA:  29 year old female with nausea vomiting and flank pain. Kidney stone suspected. EXAM: CT ABDOMEN AND PELVIS WITHOUT CONTRAST TECHNIQUE: Multidetector CT imaging of the abdomen and pelvis was performed following the standard protocol without IV contrast. COMPARISON:  CT of the abdomen pelvis dated 10/21/2018. FINDINGS: Evaluation of this exam is limited in the absence of intravenous contrast. Lower chest: The visualized lung bases are clear. No intra-abdominal free air. There is diffuse mesenteric edema and small free fluid within the pelvis. Hepatobiliary: The liver is unremarkable.  Cholecystectomy. No retained calcified stone noted in the central CBD. Pancreas: Unremarkable. No pancreatic ductal dilatation or surrounding inflammatory changes. Spleen: Normal in size without focal abnormality. Adrenals/Urinary Tract: The adrenal glands are unremarkable. There is no hydronephrosis or nephrolithiasis on either side. The visualized ureters and urinary bladder appear unremarkable. Stomach/Bowel: There is mild thickened appearance of the colon concerning for mild colitis. Clinical correlation is recommended. There is no bowel obstruction. The appendix is normal. Vascular/Lymphatic: The abdominal aorta and IVC are unremarkable. No portal venous gas. There is no adenopathy. Reproductive: Mild diffuse subcu scar a stat the uterus and ovaries are grossly unremarkable. Other: Mild diffuse subcutaneous edema. Musculoskeletal: No acute or significant osseous findings. IMPRESSION: 1. Findings concerning for mild colitis. Clinical correlation is recommended. No bowel obstruction. Normal appendix. 2. No hydronephrosis or nephrolithiasis. Electronically Signed   By: Anner Crete M.D.   On: 05/31/2019 21:18     Time Spent in minutes  30     Desiree Hane M.D on 06/01/2019 at 11:45 AM  To page go to [email or web address] - password [facility]

## 2019-06-01 NOTE — Progress Notes (Signed)
Patient c/o back pain and requested something for pain. Pt appears anxious, tearful. Flexerel and Ativan was offered, Pt agreed to take. Later she reported that it didn't helped. Tylenol was offered, Pt refused. She says that irritates her stomach. Floor hospitalist was paged, Advil was ordered. Pt refused to take it. Pt requesting something stronger for her back pain( one time dose). Floor hospitalist was paged again, awaiting for further orders. Meantime hot pack was offered to pt. Pt used just for little bit.

## 2019-06-01 NOTE — Progress Notes (Signed)
Pt blood sugar dropped to 62 given 1/2 ampule of D50 blood sugar rechecked 15 minutes later and was 141. MD notified.

## 2019-06-01 NOTE — Progress Notes (Signed)
Patients blood sugar 64 given 25 ml of D50. Rechecked patients blood sugar 15 minutes later and it is now 131. MD made aware.

## 2019-06-01 NOTE — Progress Notes (Signed)
Pt blood sugar 45 at 0739 pt given 25 ml of D50 and rechecked 15 minutes later and was 98. MD notified.

## 2019-06-01 NOTE — ED Notes (Signed)
Report called for pt transfer to floor 

## 2019-06-01 NOTE — Plan of Care (Signed)

## 2019-06-02 LAB — BASIC METABOLIC PANEL
Anion gap: 10 (ref 5–15)
BUN: 10 mg/dL (ref 6–20)
CO2: 28 mmol/L (ref 22–32)
Calcium: 8.1 mg/dL — ABNORMAL LOW (ref 8.9–10.3)
Chloride: 99 mmol/L (ref 98–111)
Creatinine, Ser: 0.77 mg/dL (ref 0.44–1.00)
GFR calc Af Amer: >60 mL/min (ref >60)
GFR calc non Af Amer: >60 mL/min (ref >60)
Glucose, Bld: 95 mg/dL (ref 70–99)
Potassium: 3.2 mmol/L — ABNORMAL LOW (ref 3.5–5.1)
Sodium: 137 mmol/L (ref 135–145)

## 2019-06-02 LAB — GLUCOSE, CAPILLARY
Glucose-Capillary: 97 mg/dL (ref 70–99)
Glucose-Capillary: 69 mg/dL — ABNORMAL LOW (ref 70–99)
Glucose-Capillary: 144 mg/dL — ABNORMAL HIGH (ref 70–99)
Glucose-Capillary: 196 mg/dL — ABNORMAL HIGH (ref 70–99)
Glucose-Capillary: 102 mg/dL — ABNORMAL HIGH (ref 70–99)

## 2019-06-02 LAB — MAGNESIUM: Magnesium: 1.5 mg/dL — ABNORMAL LOW (ref 1.7–2.4)

## 2019-06-02 MED ORDER — DEXTROSE 50 % IV SOLN
25.0000 mL | Freq: Once | INTRAVENOUS | Status: AC
  Administered 2019-06-02: 25 mL via INTRAVENOUS
  Filled 2019-06-02: qty 50

## 2019-06-02 MED ORDER — PROMETHAZINE HCL 25 MG PO TABS
12.5000 mg | ORAL_TABLET | Freq: Four times a day (QID) | ORAL | Status: DC
  Administered 2019-06-02 – 2019-06-03 (×3): 12.5 mg via ORAL
  Filled 2019-06-02 (×2): qty 1

## 2019-06-02 MED ORDER — PANTOPRAZOLE SODIUM 40 MG PO TBEC
40.0000 mg | DELAYED_RELEASE_TABLET | Freq: Every day | ORAL | Status: DC
  Administered 2019-06-03: 40 mg via ORAL
  Filled 2019-06-02: qty 1

## 2019-06-02 MED ORDER — KCL IN DEXTROSE-NACL 10-5-0.45 MEQ/L-%-% IV SOLN
INTRAVENOUS | Status: DC
  Filled 2019-06-02: qty 1000

## 2019-06-02 MED ORDER — POTASSIUM CHLORIDE 20 MEQ PO PACK
60.0000 meq | PACK | Freq: Once | ORAL | Status: AC
  Administered 2019-06-02: 60 meq via ORAL
  Filled 2019-06-02: qty 3

## 2019-06-02 MED ORDER — METOCLOPRAMIDE HCL 10 MG PO TABS
10.0000 mg | ORAL_TABLET | Freq: Three times a day (TID) | ORAL | Status: DC
  Administered 2019-06-03: 10 mg via ORAL
  Filled 2019-06-02: qty 1

## 2019-06-02 MED ORDER — INSULIN ASPART 100 UNIT/ML ~~LOC~~ SOLN
0.0000 [IU] | Freq: Three times a day (TID) | SUBCUTANEOUS | Status: DC
  Administered 2019-06-02: 2 [IU] via SUBCUTANEOUS
  Administered 2019-06-03: 1 [IU] via SUBCUTANEOUS

## 2019-06-02 MED ORDER — MAGNESIUM SULFATE IN D5W 1-5 GM/100ML-% IV SOLN
1.0000 g | INTRAVENOUS | Status: AC
  Administered 2019-06-02 (×2): 1 g via INTRAVENOUS
  Filled 2019-06-02 (×2): qty 100

## 2019-06-02 NOTE — TOC Initial Note (Signed)
Transition of Care [facility]) - Initial/Assessment Note    Patient Details  Name: [name] MRN: [id number] Date of Birth: 09-Apr-1990  [facility] of [facility]) CM/SW Contact:    Purcell Mouton, RN Phone Number: 06/02/2019, 11:05 AM  Clinical Narrative:                 Pt from home and has a PCP. At present time there are no home health needs.   Expected Discharge Plan: Home/Self Care Barriers to Discharge: No Barriers Identified   Patient Goals and CMS Choice Patient states their goals for this hospitalization and ongoing recovery are:: To get better      Expected Discharge Plan and Services Expected Discharge Plan: Home/Self Care       Living arrangements for the past 2 months: Single Family Home                                      Prior Living Arrangements/Services Living arrangements for the past 2 months: Single Family Home Lives with:: Minor Children, Spouse   Do you feel safe going back to the place where you live?: Yes               Activities of Daily Living Home Assistive Devices/Equipment: CBG Meter ADL Screening (condition at time of admission) Patient's cognitive ability adequate to safely complete daily activities?: Yes Is the patient deaf or have difficulty hearing?: No Does the patient have difficulty seeing, even when wearing glasses/contacts?: No Does the patient have difficulty concentrating, remembering, or making decisions?: No Patient able to express need for assistance with ADLs?: Yes Does the patient have difficulty dressing or bathing?: No Independently performs ADLs?: Yes (appropriate for developmental age) Does the patient have difficulty walking or climbing stairs?: No Weakness of Legs: None Weakness of Arms/Hands: None  Permission Sought/Granted Permission sought to share information with : Case Manager                Emotional Assessment Appearance:: Appears stated age     Orientation: : Oriented to Self,  Oriented to Place, Oriented to  Time, Oriented to Situation      Admission diagnosis:  Colitis [K52.9] Intractable nausea and vomiting [R11.2] Bilious vomiting with nausea [U27.25] Intractable cyclical vomiting with nausea [R11.15] Patient Active Problem List   Diagnosis Date Noted  . Intractable cyclical vomiting with nausea 06/01/2019  . COVID-19 virus infection 05/31/2019  . Nausea & vomiting 04/30/2019  . Hypertensive urgency 04/30/2019  . Cannabinoid hyperemesis syndrome 04/30/2019  . Contraception management 08/06/2018  . Cyclical vomiting syndrome 06/07/2018  . Diabetic gastroparesis associated with type 1 diabetes mellitus ([place]) 06/04/2018  . Anxiety 05/31/2018  . Peripheral edema 05/31/2018  . Normocytic anemia 07/10/2017  . Hypokalemia due to excessive gastrointestinal loss of potassium 06/23/2017  . GERD (gastroesophageal reflux disease) 06/23/2017  . Intractable nausea and vomiting 04/11/2017  . MDD (major depressive disorder), recurrent episode, mild ([place]) 02/28/2017  . Diabetes mellitus type 1 ([place]) 04/28/2013  . Protein-calorie malnutrition, severe ([place]) 04/28/2013  . Hypertension associated with diabetes ([place]) 11/14/2011  . Chronic abdominal pain 09/18/2011   PCP:  [name], MD Pharmacy:   [facility] [place], [place] [address] Phone: [phone] Fax: [phone]     Social Determinants of Health (  SDOH) Interventions    Readmission Risk Interventions Readmission Risk Prevention Plan 07/30/2018 06/09/2018  Transportation Screening Complete Complete  PCP or Specialist Appt within 3-5 Days Complete -  HRI or [place] Not Complete -  HRI or Home Care Consult comments NA -  Social Work Consult for [place] Planning/Counseling Not Complete -  SW consult not completed comments NA -  Palliative Care Screening Not Applicable -   Medication Review [employer]) (No Data) Complete  PCP or Specialist appointment within 3-5 days of discharge - Complete  HRI or [place] - Complete  SW Recovery Care/Counseling Consult - Complete  [place] - Not Applicable  Some recent data might be hidden

## 2019-06-02 NOTE — Progress Notes (Signed)
[facility]  PROGRESS NOTE  [name] JJH:[id number] DOB: 08-28-1990 DOA: 05/31/2019 PCP: [name], MD Admit date - 05/31/2019   Admitting Physician Desiree Hane, MD  Outpatient Primary MD for the patient is [name], MD  LOS - 1 Brief Narrative   [name] is a 29 y.o. year old female with medical history significant for poorly controlled type 1 diabetes (with previous history of DKA), chronic anasarca/ascites due to malnutrition, severe gastroparesis for several years with recurrent admissions for intractable/cyclical nausea/vomiting who presented on 05/31/2019 with acute on chronic nausea/vomiting and abdominal pain.  Hospital course complicated by persistent hypoglycemic episodes, inability to tolerate oral intake related to persistent vomiting.  Subjective  Today she has appetite and ready to try regular diet. Had one additional episode of vomiting yesterday afternoon. Glucose this am of 67.    A & P  Non-Intractable cyclical nausea and vomiting in poorly controlled type I diabetic, likely acute on chronic gastroparesis and/or cannabinoid hyperemesis syndrome.  Vomiting episodes have decreased with last episode on 3/3 in the evening.  Relative hypoglycemia of 67 off D5 infusion, but patient wants to trial diet. -Monitor blood glucose, emesis on trial oral diet -Switch to p.o. PPI -Continue IV pro- motility agents (Reglan) -If continues to improve clinically anticipate discharge 4 hours  -Continue topical capsaicin, Flexeril/Bentyl as needed -Daily EKG, monitor QTC (436, 3/4) -Schedule IV Phenergan, as needed Zofran -Avoid opioids/pain medication due to high potential for exacerbating primary underlying GI issue -Patient counseled on cessation of marijuana use, admits that she is decreased her frequency if clinically does not improve with supportive measures above warrant GI consultation  Type 1 diabetes, poorly controlled.  A1c (04/2019) 7.8. Required  D5 infusion over the last 24 hours.  Will monitor blood glucose on decrease Lantus to 5 units while on oral diet and off D5 infusion -If able to maintain normal glucose levels dissipate discharge next 12 hours -Sliding scale with meals as long as eating greater than 50% of meals  Covid -19 infection Diagnosed on 05/21/2019 BS screening test prior to hospitalization at that time. Patient remains afebrile, not hypoxic, chest x-ray with no infiltrates.  No medical indication for remdesivir -Remdesivir discontinued on 3/3   Hypokalemia, persistent in the setting of hypomagnesemia.  Emesis episodes have decreased -Receiving IV magnesium, p.o. potassium -Repeat BMP in a.m.   Chronic normocytic anemia  Hemoglobin stable at baseline, no bleeding -Monitor CBC  Hypertension, stable -Hydralazine 50 mg every 6 hours     Family Communication  :  No family update  Code Status :  FULL code  Disposition Plan  :  Patient is from home. Anticipated d/c date:  24 to 48 hours barriers to d/c and necessity for inpatient status:  If able to maintain oral diet without hypoglycemia and use of IV promotility agents anticipate discharge, monitoring on oral diet today, 3/4 Consults  : None  Procedures  : None   DVT Prophylaxis  :  Lovenox   Lab Results  Component Value Date   PLT 241 06/01/2019    Diet :  Diet Order            Diet heart healthy/carb modified Room service appropriate? Yes; Fluid consistency: Thin  Diet effective now               Inpatient Medications Scheduled Meds: . enoxaparin (LOVENOX) injection  40 mg Subcutaneous Q24H  . hydrALAZINE  50 mg Oral BID  .  insulin aspart  0-9 Units Subcutaneous Q4H  . insulin glargine  5 Units Subcutaneous QHS  . metoCLOPramide (REGLAN) injection  10 mg Intravenous Q8H  . metoprolol tartrate  25 mg Oral BID  . mirtazapine  30 mg Oral QHS  . pantoprazole (PROTONIX) IV  40 mg Intravenous Q24H  . potassium chloride  60 mEq Oral Once  .  promethazine  12.5 mg Intravenous Q6H   Continuous Infusions: . magnesium sulfate bolus IVPB     PRN Meds:.acetaminophen **OR** acetaminophen, capsaicin, cyclobenzaprine, dicyclomine, haloperidol lactate, LORazepam, ondansetron **OR** ondansetron (ZOFRAN) IV  Antibiotics  :   Anti-infectives (From admission, onward)   Start     Dose/Rate Route Frequency Ordered Stop   06/02/19 1000  remdesivir 100 mg in sodium chloride 0.9 % 100 mL IVPB  Status:  Discontinued     100 mg 200 mL/hr over 30 Minutes Intravenous Daily 05/31/19 2159 05/31/19 2201   06/02/19 1000  remdesivir 100 mg in sodium chloride 0.9 % 100 mL IVPB  Status:  Discontinued     100 mg 200 mL/hr over 30 Minutes Intravenous Daily 05/31/19 2202 06/01/19 2028   05/31/19 2230  remdesivir 200 mg in sodium chloride 0.9% 250 mL IVPB  Status:  Discontinued     200 mg 580 mL/hr over 30 Minutes Intravenous Once 05/31/19 2159 05/31/19 2201   05/31/19 2230  remdesivir 200 mg in sodium chloride 0.9% 250 mL IVPB  Status:  Discontinued     100 mg 540 mL/hr over 30 Minutes Intravenous Every 1 hr x 2 05/31/19 2202 06/01/19 0029   05/31/19 2145  ciprofloxacin (CIPRO) IVPB 400 mg  Status:  Discontinued     400 mg 200 mL/hr over 60 Minutes Intravenous  Once 05/31/19 2139 05/31/19 2147   05/31/19 2145  metroNIDAZOLE (FLAGYL) IVPB 500 mg  Status:  Discontinued     500 mg 100 mL/hr over 60 Minutes Intravenous  Once 05/31/19 2139 05/31/19 2147       Objective   Vitals:   06/01/19 0419 06/01/19 1319 06/01/19 2005 06/02/19 0418  BP: (!) 137/103 131/86 (!) 164/100 (!) 128/94  Pulse: 93 89 (!) 101 90  Resp: 20 18 20 18   Temp: 97.8 F (36.6 C) 98.5 F (36.9 C) 97.7 F (36.5 C) 98.5 F (36.9 C)  TempSrc: Oral Oral Oral Oral  SpO2: 100% 100% 100% 100%  Weight:      Height:        SpO2: 100 %  Wt Readings from Last 3 Encounters:  06/01/19 56.7 kg  05/30/19 59 kg  05/29/19 65 kg     Intake/Output Summary (Last 24 hours) at 06/02/2019  0858 Last data filed at 06/02/2019 0522 Gross per 24 hour  Intake 2897.22 ml  Output 0 ml  Net 2897.22 ml    Physical Exam:  Awake Alert, Oriented X 3, normal affect No new F.N deficits,  [place].AT, Symmetrical Chest wall movement, Good air movement bilaterally on room air CTAB RRR,No Gallops,Rubs or new Murmurs,  Abd Soft, No tenderness,, No rebound, guarding or rigidity. No Cyanosis, Clubbing or edema, No new Rash or bruise     I have personally reviewed the following:   Data Reviewed:  CBC Recent Labs  Lab 05/27/19 0522 05/27/19 0522 05/28/19 1339 05/29/19 0136 05/30/19 2010 05/31/19 1922 06/01/19 0327  WBC 2.5*   < > 3.7* 3.3* 3.7* 5.2 6.3  HGB 9.6*   < > 10.1* 9.3* 11.3* 11.3* 9.5*  HCT 29.0*   < >  30.5* 27.9* 35.9* 35.1* 30.0*  PLT 185   < > 220 214 297 333 241  MCV 84.1   < > 84.0 83.5 86.5 85.6 87.0  MCH 27.8   < > 27.8 27.8 27.2 27.6 27.5  MCHC 33.1   < > 33.1 33.3 31.5 32.2 31.7  RDW 12.9   < > 12.8 13.1 13.2 13.2 13.2  LYMPHSABS 1.4  --  1.2  --   --  1.7  --   MONOABS 0.2  --  0.3  --   --  0.4  --   EOSABS 0.0  --  0.0  --   --  0.1  --   BASOSABS 0.0  --  0.0  --   --  0.0  --    < > = values in this interval not displayed.    Chemistries  Recent Labs  Lab 05/28/19 1339 05/28/19 1339 05/29/19 0136 05/30/19 2010 05/31/19 1922 06/01/19 0327 06/02/19 0319  NA 134*   < > 135 143 134* 137 137  K 3.0*   < > 3.1* 3.0* 3.3* 3.3* 3.2*  CL 91*   < > 95* 95* 92* 101 99  CO2 29   < > 25 27 31 26 28   GLUCOSE 498*   < > 346* 353* 466* 184* 95  BUN 19   < > 17 11 10 10 10   CREATININE 1.24*   < > 0.92 1.22* 0.99 0.87 0.77  CALCIUM 8.2*   < > 7.9* 8.9 9.0 7.9* 8.1*  MG  --   --   --  1.8  --   --  1.5*  AST 38  --  33 37 21 23  --   ALT 45*  --  36 40 33 26  --   ALKPHOS 83  --  68 94 84 66  --   BILITOT 0.7  --  0.7 1.1 0.5 0.7  --    < > = values in this interval not displayed.    ------------------------------------------------------------------------------------------------------------------ No results for input(s): CHOL, HDL, LDLCALC, TRIG, CHOLHDL, LDLDIRECT in the last 72 hours.  Lab Results  Component Value Date   HGBA1C 7.8 (H) 04/30/2019   ------------------------------------------------------------------------------------------------------------------ No results for input(s): TSH, T4TOTAL, T3FREE, THYROIDAB in the last 72 hours.  Invalid input(s): FREET3 ------------------------------------------------------------------------------------------------------------------ No results for input(s): VITAMINB12, FOLATE, FERRITIN, TIBC, IRON, RETICCTPCT in the last 72 hours.  Coagulation profile No results for input(s): INR, PROTIME in the last 168 hours.  No results for input(s): DDIMER in the last 72 hours.  Cardiac Enzymes No results for input(s): CKMB, TROPONINI, MYOGLOBIN in the last 168 hours.  Invalid input(s): CK ------------------------------------------------------------------------------------------------------------------ No results found for: BNP  Micro Results Recent Results (from the past 240 hour(s))  Blood Culture (routine x 2)     Status: None   Collection Time: 05/25/19  9:58 PM   Specimen: BLOOD  Result Value Ref Range Status   Specimen Description   Final    BLOOD BLOOD RIGHT FOREARM Performed at [facility] [address] [place], [place] [address]    Special Requests   Final    BOTTLES DRAWN AEROBIC AND ANAEROBIC Blood Culture adequate volume Performed at [facility] [address], [place], [place] [address]    Culture   Final    NO GROWTH 5 DAYS Performed at [facility], [place] [address] [place], [place] [address]    Report Status 05/31/2019 FINAL  Final  Radiology Reports DG CHEST PORT 1 VIEW  Result Date: 06/01/2019 CLINICAL DATA:  COVID-19 detected EXAM: PORTABLE  CHEST 1 VIEW COMPARISON:  Portable exam 1710 hours compared to 05/25/2019 FINDINGS: Slight rotation to the RIGHT. Normal heart size, mediastinal contours, and pulmonary vascularity. Lungs clear. No pulmonary infiltrate, pleural effusion or pneumothorax. Osseous structures unremarkable. IMPRESSION: No acute abnormalities. Electronically Signed   By: Lavonia Dana M.D.   On: 06/01/2019 17:29   DG Chest Port 1 View  Result Date: 05/25/2019 CLINICAL DATA:  Shortness of breath, back pain, diabetic, COVID EXAM: PORTABLE CHEST 1 VIEW COMPARISON:  Radiograph 03/13/2019 FINDINGS: No consolidation, features of edema, pneumothorax, or effusion. Pulmonary vascularity is normally distributed. The cardiomediastinal contours are unremarkable. No acute osseous or soft tissue abnormality. Cholecystectomy clips present in the right upper quadrant. IMPRESSION: No acute cardiopulmonary abnormality. Electronically Signed   By: Lovena Le M.D.   On: 05/25/2019 22:31   DG ABD ACUTE 2+V W 1V CHEST  Result Date: 05/28/2019 CLINICAL DATA:  29 year old female with abdominal pain and vomiting EXAM: DG ABDOMEN ACUTE W/ 1V CHEST COMPARISON:  03/14/2019 FINDINGS: Chest: Cardiomediastinal silhouette within normal limits. No pneumothorax pleural effusion or confluent airspace disease. Abdomen: Paucity of gas within small bowel colon. No distention. No air-fluid levels. No unexpected radiopaque foreign body, soft tissue density, or calcification. No displaced fracture. IMPRESSION: Chest: No radiographic evidence of acute cardiopulmonary disease. Abdomen: Nonobstructive bowel gas pattern. Electronically Signed   By: Corrie Mckusick D.O.   On: 05/28/2019 15:14   CT Renal Stone Study  Result Date: 05/31/2019 CLINICAL DATA:  29 year old female with nausea vomiting and flank pain. Kidney stone suspected. EXAM: CT ABDOMEN AND PELVIS WITHOUT CONTRAST TECHNIQUE: Multidetector CT imaging of the abdomen and pelvis was performed following the standard  protocol without IV contrast. COMPARISON:  CT of the abdomen pelvis dated 10/21/2018. FINDINGS: Evaluation of this exam is limited in the absence of intravenous contrast. Lower chest: The visualized lung bases are clear. No intra-abdominal free air. There is diffuse mesenteric edema and small free fluid within the pelvis. Hepatobiliary: The liver is unremarkable. Cholecystectomy. No retained calcified stone noted in the central CBD. Pancreas: Unremarkable. No pancreatic ductal dilatation or surrounding inflammatory changes. Spleen: Normal in size without focal abnormality. Adrenals/Urinary Tract: The adrenal glands are unremarkable. There is no hydronephrosis or nephrolithiasis on either side. The visualized ureters and urinary bladder appear unremarkable. Stomach/Bowel: There is mild thickened appearance of the colon concerning for mild colitis. Clinical correlation is recommended. There is no bowel obstruction. The appendix is normal. Vascular/Lymphatic: The abdominal aorta and IVC are unremarkable. No portal venous gas. There is no adenopathy. Reproductive: Mild diffuse subcu scar a stat the uterus and ovaries are grossly unremarkable. Other: Mild diffuse subcutaneous edema. Musculoskeletal: No acute or significant osseous findings. IMPRESSION: 1. Findings concerning for mild colitis. Clinical correlation is recommended. No bowel obstruction. Normal appendix. 2. No hydronephrosis or nephrolithiasis. Electronically Signed   By: Anner Crete M.D.   On: 05/31/2019 21:18     Time Spent in minutes  30     Desiree Hane M.D on 06/02/2019 at 8:58 AM  To page go to [email or web address] - password [facility]

## 2019-06-02 NOTE — Progress Notes (Signed)
Pt expressed interest in trying to eat today.  Pt has had no complains of nausea and no vomiting during this shift

## 2019-06-03 LAB — BASIC METABOLIC PANEL
Anion gap: 6 (ref 5–15)
BUN: 14 mg/dL (ref 6–20)
CO2: 28 mmol/L (ref 22–32)
Calcium: 8.4 mg/dL — ABNORMAL LOW (ref 8.9–10.3)
Chloride: 102 mmol/L (ref 98–111)
Creatinine, Ser: 0.78 mg/dL (ref 0.44–1.00)
GFR calc Af Amer: >60 mL/min (ref >60)
GFR calc non Af Amer: >60 mL/min (ref >60)
Glucose, Bld: 145 mg/dL — ABNORMAL HIGH (ref 70–99)
Potassium: 3.4 mmol/L — ABNORMAL LOW (ref 3.5–5.1)
Sodium: 136 mmol/L (ref 135–145)

## 2019-06-03 LAB — GLUCOSE, CAPILLARY: Glucose-Capillary: 139 mg/dL — ABNORMAL HIGH (ref 70–99)

## 2019-06-03 NOTE — Discharge Summary (Signed)
[name] BWI:[id number] DOB: 09-Jul-1990 DOA: 05/31/2019  PCP: [name], MD  Admit date: 05/31/2019 Discharge date: 06/03/2019  Admitted From: Home Disposition: Home  Recommendations for Outpatient Follow-up:  1. Follow up with PCP in 1-2 weeks 2.   Home Health: None Equipment/Devices: None   Discharge Condition: Stable CODE STATUS: Full code   Brief/Interim Summary: History of present illness:  [name] is a 29 y.o. year old female with medical history significant for poorly controlled type 1 diabetes (with previous history of DKA), chronic anasarca/ascites due to malnutrition, severe gastroparesis for several years with recurrent admissions for intractable/cyclical nausea/vomiting who presented on 05/31/2019 with acute on chronic nausea/vomiting and abdominal pain.  Hospital course complicated by persistent hypoglycemic episodes, inability to tolerate oral intake related to persistent vomiting.   Remaining hospital course addressed in problem based format below:   Hospital Course:  Non-intractable cyclical nausea vomiting and poorly controlled type I diabetic, related to acute on chronic gastroparesis and/or cannabinoid hyperemesis syndrome, improved Patient's vomiting episodes improved with addition of scheduled IV Reglan and scheduled IV Phenergan.  Her course was complicated by relative hypoglycemia 67 that required D5 infusion in setting of n.p.o. status.  With scheduled IV promotility agents, capsaicin cream, and as needed IV Zofran patient was able to tolerate regular diabetic diet with no recurrent emesis episodes on day of discharge she was counseled on discontinuing further marijuana use this is likely exacerbating flares as well.  QTC was monitored and EKG during admission -No new medications on discharge  Type I diabetic, poorly controlled, A1c 7.8 Required D5 infusion briefly during hospitalization given hypoglycemia while n.p.o.  That normalized with patient  was able to eat diet.  Patient will continue home Lantus regimen on discharge  Covid-19 infection Diagnosed on 05/21/2019 as screening test prior to hospitalization at that time. Patient remained afebrile, with no hypoxia, and no infiltrates on chest x-ray.  Patient was given IV remdesivir on admission, this was discontinued on 3/3 given no symptoms and above.  Patient was advised to quarantine on discharge.   Consultations:  None  Procedures/Studies: None Subjective: Feeling well, no abdominal pain, no recurrent episodes of emesis for greater than 24 hours, tolerating diet.  Ready go home. Discharge Exam: Vitals:   06/03/19 0638 06/03/19 0955  BP: (!) 132/97 119/81  Pulse: 92 90  Resp: 17   Temp: 98.6 F (37 C)   SpO2: 100%    Vitals:   06/02/19 1059 06/02/19 2112 06/03/19 0638 06/03/19 0955  BP: 108/82 (!) 150/103 (!) 132/97 119/81  Pulse: 98 91 92 90  Resp: 16 18 17    Temp: 98.3 F (36.8 C) 98 F (36.7 C) 98.6 F (37 C)   TempSrc: Oral Oral Oral   SpO2: 100% 100% 100%   Weight:      Height:        General: sitting up in bed comfortably, no apparent distress Eyes: EOMI, anicteric ENT: Oral Mucosa clear and moist Cardiovascular: regular rate and rhythm, no murmurs, rubs or gallops, no edema, Respiratory: Normal respiratory effort on room air, lungs clear to auscultation bilaterally Abdomen: soft, non-distended, non-tender, normal bowel sounds Skin: No Rash Neurologic: Grossly no focal neuro deficit.Mental status AAOx3, speech normal, Psychiatric:Appropriate affect, and mood  Discharge Diagnoses:  Principal Problem:   Intractable nausea and vomiting Active Problems:   Chronic abdominal pain   Hypertension associated with diabetes ([place])   Diabetes mellitus type 1 (HCC)   Hypokalemia due to excessive gastrointestinal loss  of potassium   Normocytic anemia   Diabetic gastroparesis associated with type 1 diabetes mellitus (HCC)   Cyclical vomiting syndrome    Cannabinoid hyperemesis syndrome   COVID-19 virus infection   Intractable cyclical vomiting with nausea    Discharge Instructions  Discharge Instructions    Diet - low sodium heart healthy   Complete by: As directed    Increase activity slowly   Complete by: As directed      Allergies as of 06/03/2019      Reactions   Other Anaphylaxis   Reaction to [place] nuts   Lactose Intolerance (gi) Diarrhea      Medication List    STOP taking these medications   ibuprofen 200 MG tablet Commonly known as: ADVIL     TAKE these medications   cyclobenzaprine 10 MG tablet Commonly known as: FLEXERIL TAKE 1 TABLET(10 MG) BY MOUTH THREE TIMES DAILY AS NEEDED FOR MUSCLE SPASMS What changed: See the new instructions.   Dexcom G6 Receiver Kerrin Mo See admin instructions. USE AS DIRECTED TO MONITOR GLUCOSE LEVELS   Dexcom G6 Sensor Misc 1 each by Does not apply route See admin instructions. every 10 days.   Dexcom G6 Transmitter Misc See admin instructions. USE AS DIRECTED TO MONITOR GLUCOSE LEVELS   dicyclomine 20 MG tablet Commonly known as: BENTYL Take 1 tablet (20 mg total) by mouth 3 (three) times daily as needed for spasms.   furosemide 40 MG tablet Commonly known as: LASIX TAKE 1 TABLET BY MOUTH  DAILY   hydrALAZINE 50 MG tablet Commonly known as: APRESOLINE Take 1 tablet (50 mg total) by mouth every 6 (six) hours. What changed: when to take this   insulin lispro 100 UNIT/ML KwikPen Commonly known as: HumaLOG KwikPen Inject 0.06 mLs (6 Units total) into the skin 3 (three) times daily. What changed:   how much to take  additional instructions   Lantus SoloStar 100 UNIT/ML Solostar Pen Generic drug: insulin glargine Inject 10 Units into the skin at bedtime. And pen needles 4/day What changed: how much to take   loperamide 2 MG capsule Commonly known as: IMODIUM Take 6 mg by mouth as needed for diarrhea or loose stools.   LORazepam 0.5 MG tablet Commonly known as:  ATIVAN Take 1 tablet (0.5 mg total) by mouth 2 (two) times daily as needed for anxiety.   metoprolol tartrate 25 MG tablet Commonly known as: LOPRESSOR Take 1 tablet (25 mg total) by mouth 2 (two) times daily.   mirtazapine 30 MG tablet Commonly known as: REMERON TAKE 1 TABLET BY MOUTH AT  BEDTIME   ondansetron 4 MG tablet Commonly known as: ZOFRAN Take 1 tablet (4 mg total) by mouth every 8 (eight) hours as needed for nausea or vomiting.   OVER THE COUNTER MEDICATION Take 2 each by mouth daily. Flo Chewable Gummies for period   pantoprazole 40 MG tablet Commonly known as: PROTONIX TAKE 1 TABLET BY MOUTH  DAILY   promethazine 25 MG suppository Commonly known as: PHENERGAN Place 1 suppository (25 mg total) rectally every 6 (six) hours as needed for nausea or vomiting.   promethazine 25 MG suppository Commonly known as: PHENERGAN Place 1 suppository (25 mg total) rectally every 6 (six) hours as needed for nausea or vomiting.       Allergies  Allergen Reactions  . Other Anaphylaxis    Reaction to [place] nuts   . Lactose Intolerance (Gi) Diarrhea        The results of significant  diagnostics from this hospitalization (including imaging, microbiology, ancillary and laboratory) are listed below for reference.     Microbiology: Recent Results (from the past 240 hour(s))  Blood Culture (routine x 2)     Status: None   Collection Time: 05/25/19  9:58 PM   Specimen: BLOOD  Result Value Ref Range Status   Specimen Description   Final    BLOOD BLOOD RIGHT FOREARM Performed at [facility] [address] [place], [place] [address]    Special Requests   Final    BOTTLES DRAWN AEROBIC AND ANAEROBIC Blood Culture adequate volume Performed at [facility] [address], [place], [place] [address]    Culture   Final    NO GROWTH 5 DAYS Performed at [facility], [place] [address], [place], [place] [address]    Report Status  05/31/2019 FINAL  Final     Labs: BNP (last 3 results) No results for input(s): BNP in the last 8760 hours. Basic Metabolic Panel: Recent Labs  Lab 05/30/19 2010 05/31/19 1922 06/01/19 0327 06/02/19 0319 06/03/19 0443  NA 143 134* 137 137 136  K 3.0* 3.3* 3.3* 3.2* 3.4*  CL 95* 92* 101 99 102  CO2 27 31 26 28 28   GLUCOSE 353* 466* 184* 95 145*  BUN 11 10 10 10 14   CREATININE 1.22* 0.99 0.87 0.77 0.78  CALCIUM 8.9 9.0 7.9* 8.1* 8.4*  MG 1.8  --   --  1.5*  --    Liver Function Tests: Recent Labs  Lab 05/28/19 1339 05/29/19 0136 05/30/19 2010 05/31/19 1922 06/01/19 0327  AST 38 33 37 21 23  ALT 45* 36 40 33 26  ALKPHOS 83 68 94 84 66  BILITOT 0.7 0.7 1.1 0.5 0.7  PROT 7.9 6.5 8.3* 7.8 6.4*  ALBUMIN 3.5 2.9* 3.9 3.5 2.8*   Recent Labs  Lab 05/28/19 1339 05/29/19 0136 05/30/19 2010 05/31/19 1922  LIPASE 19 17 18 23    No results for input(s): AMMONIA in the last 168 hours. CBC: Recent Labs  Lab 05/28/19 1339 05/29/19 0136 05/30/19 2010 05/31/19 1922 06/01/19 0327  WBC 3.7* 3.3* 3.7* 5.2 6.3  NEUTROABS 2.2  --   --  2.9  --   HGB 10.1* 9.3* 11.3* 11.3* 9.5*  HCT 30.5* 27.9* 35.9* 35.1* 30.0*  MCV 84.0 83.5 86.5 85.6 87.0  PLT 220 214 297 333 241   Cardiac Enzymes: No results for input(s): CKTOTAL, CKMB, CKMBINDEX, TROPONINI in the last 168 hours. BNP: Invalid input(s): POCBNP CBG: Recent Labs  Lab 06/02/19 0821 06/02/19 1108 06/02/19 1700 06/02/19 2242 06/03/19 0817  GLUCAP 69* 144* 196* 102* 139*   D-Dimer No results for input(s): DDIMER in the last 72 hours. Hgb A1c No results for input(s): HGBA1C in the last 72 hours. Lipid Profile No results for input(s): CHOL, HDL, LDLCALC, TRIG, CHOLHDL, LDLDIRECT in the last 72 hours. Thyroid function studies No results for input(s): TSH, T4TOTAL, T3FREE, THYROIDAB in the last 72 hours.  Invalid input(s): FREET3 Anemia work up No results for input(s): VITAMINB12, FOLATE, FERRITIN, TIBC, IRON,  RETICCTPCT in the last 72 hours. Urinalysis    Component Value Date/Time   COLORURINE YELLOW 05/31/2019 1922   APPEARANCEUR CLEAR 05/31/2019 1922   LABSPEC 1.014 05/31/2019 1922   PHURINE 6.0 05/31/2019 1922   GLUCOSEU >=500 (A) 05/31/2019 1922   HGBUR MODERATE (A) 05/31/2019 1922   BILIRUBINUR NEGATIVE 05/31/2019 1922   BILIRUBINUR negative 01/02/2015 1717   KETONESUR 5 (  A) 05/31/2019 1922   PROTEINUR 100 (A) 05/31/2019 1922   UROBILINOGEN 0.2 01/13/2015 0223   NITRITE NEGATIVE 05/31/2019 1922   LEUKOCYTESUR NEGATIVE 05/31/2019 1922   Sepsis Labs Invalid input(s): PROCALCITONIN,  WBC,  LACTICIDVEN Microbiology Recent Results (from the past 240 hour(s))  Blood Culture (routine x 2)     Status: None   Collection Time: 05/25/19  9:58 PM   Specimen: BLOOD  Result Value Ref Range Status   Specimen Description   Final    BLOOD BLOOD RIGHT FOREARM Performed at [facility], [place] [address], [place], [place] [address]    Special Requests   Final    BOTTLES DRAWN AEROBIC AND ANAEROBIC Blood Culture adequate volume Performed at [facility] [address] [place], [place] [address]    Culture   Final    NO GROWTH 5 DAYS Performed at [facility], [place] [address] [place], [place] [address]    Report Status 05/31/2019 FINAL  Final     Time coordinating discharge: Over 30 minutes  SIGNED:   Desiree Hane, MD  [facility] 06/03/2019, 10:15 AM Pager   If 7PM-7AM, please contact night-coverage [email or web address] Password [email or web address]

## 2019-06-03 NOTE — Progress Notes (Signed)
Discharge instructions provided to patient. Verbalized understanding. IV-dc'd intact. Awaiting patient transportation.

## 2019-06-04 ENCOUNTER — Other Ambulatory Visit: Payer: Self-pay | Admitting: Family Medicine

## 2019-06-06 ENCOUNTER — Telehealth: Payer: Self-pay

## 2019-06-06 NOTE — Telephone Encounter (Signed)
Left message for patient to return call.

## 2019-06-07 ENCOUNTER — Other Ambulatory Visit: Payer: Self-pay | Admitting: *Deleted

## 2019-06-07 ENCOUNTER — Telehealth: Payer: Self-pay

## 2019-06-07 MED ORDER — CYCLOBENZAPRINE HCL 10 MG PO TABS: 10.0000 mg | ORAL_TABLET | Freq: Three times a day (TID) | ORAL | 1 refills | Status: DC | PRN

## 2019-06-07 NOTE — Telephone Encounter (Signed)
LAST APPOINTMENT DATE: 01/04/2019  NEXT APPOINTMENT DATE:N/A   LAST REFILL:  04/29/2019  Rx Cyclobenzaprine 10mg   QTY:#30 0Rf

## 2019-06-07 NOTE — Telephone Encounter (Signed)
2nd attempt at reaching patient post hospital discharge- unsuccessful.   Will send mychart messge for patient to contact the office

## 2019-06-17 ENCOUNTER — Other Ambulatory Visit: Payer: Self-pay | Admitting: Gastroenterology

## 2019-06-21 ENCOUNTER — Emergency Department (HOSPITAL_COMMUNITY)
Admission: EM | Admit: 2019-06-21 | Discharge: 2019-06-21 | Disposition: A | Discharge status: Home / Self Care | Payer: 59 | Attending: Emergency Medicine | Admitting: Emergency Medicine

## 2019-06-21 ENCOUNTER — Other Ambulatory Visit: Payer: Self-pay

## 2019-06-21 ENCOUNTER — Encounter (HOSPITAL_COMMUNITY): Payer: Self-pay | Admitting: Emergency Medicine

## 2019-06-21 DIAGNOSIS — Z79899 Other long term (current) drug therapy: Secondary | ICD-10-CM | POA: Diagnosis not present

## 2019-06-21 DIAGNOSIS — R197 Diarrhea, unspecified: Secondary | ICD-10-CM | POA: Diagnosis not present

## 2019-06-21 DIAGNOSIS — E109 Type 1 diabetes mellitus without complications: Secondary | ICD-10-CM | POA: Insufficient documentation

## 2019-06-21 DIAGNOSIS — R112 Nausea with vomiting, unspecified: Secondary | ICD-10-CM | POA: Diagnosis not present

## 2019-06-21 DIAGNOSIS — Z9049 Acquired absence of other specified parts of digestive tract: Secondary | ICD-10-CM | POA: Diagnosis not present

## 2019-06-21 DIAGNOSIS — Z8616 Personal history of COVID-19: Secondary | ICD-10-CM | POA: Diagnosis not present

## 2019-06-21 DIAGNOSIS — I10 Essential (primary) hypertension: Secondary | ICD-10-CM | POA: Diagnosis not present

## 2019-06-21 LAB — COMPREHENSIVE METABOLIC PANEL
ALT: 51 U/L — ABNORMAL HIGH (ref 0–44)
AST: 51 U/L — ABNORMAL HIGH (ref 15–41)
Albumin: 2.9 g/dL — ABNORMAL LOW (ref 3.5–5.0)
Alkaline Phosphatase: 96 U/L (ref 38–126)
Anion gap: 10 (ref 5–15)
BUN: 25 mg/dL — ABNORMAL HIGH (ref 6–20)
CO2: 30 mmol/L (ref 22–32)
Calcium: 9.4 mg/dL (ref 8.9–10.3)
Chloride: 101 mmol/L (ref 98–111)
Creatinine, Ser: 0.89 mg/dL (ref 0.44–1.00)
GFR calc Af Amer: >60 mL/min (ref >60)
GFR calc non Af Amer: >60 mL/min (ref >60)
Glucose, Bld: 186 mg/dL — ABNORMAL HIGH (ref 70–99)
Potassium: 4.9 mmol/L (ref 3.5–5.1)
Sodium: 141 mmol/L (ref 135–145)
Total Bilirubin: 0.3 mg/dL (ref 0.3–1.2)
Total Protein: 8.3 g/dL — ABNORMAL HIGH (ref 6.5–8.1)

## 2019-06-21 LAB — CBC WITH DIFFERENTIAL/PLATELET
Abs Immature Granulocytes: 0.03 10*3/uL (ref 0.00–0.07)
Basophils Absolute: 0.1 10*3/uL (ref 0.0–0.1)
Basophils Relative: 1 %
Eosinophils Absolute: 0.1 10*3/uL (ref 0.0–0.5)
Eosinophils Relative: 2 %
HCT: 28.1 % — ABNORMAL LOW (ref 36.0–46.0)
Hemoglobin: 8.6 g/dL — ABNORMAL LOW (ref 12.0–15.0)
Immature Granulocytes: 0 %
Lymphocytes Relative: 15 %
Lymphs Abs: 1.1 10*3/uL (ref 0.7–4.0)
MCH: 27.5 pg (ref 26.0–34.0)
MCHC: 30.6 g/dL (ref 30.0–36.0)
MCV: 89.8 fL (ref 80.0–100.0)
Monocytes Absolute: 0.3 10*3/uL (ref 0.1–1.0)
Monocytes Relative: 5 %
Neutro Abs: 5.7 10*3/uL (ref 1.7–7.7)
Neutrophils Relative %: 77 %
Platelets: 350 10*3/uL (ref 150–400)
RBC: 3.13 MIL/uL — ABNORMAL LOW (ref 3.87–5.11)
RDW: 12.9 % (ref 11.5–15.5)
WBC: 7.4 10*3/uL (ref 4.0–10.5)
nRBC: 0 % (ref 0.0–0.2)

## 2019-06-21 LAB — BLOOD GAS, VENOUS
Acid-Base Excess: 4.1 mmol/L — ABNORMAL HIGH (ref 0.0–2.0)
Bicarbonate: 31.3 mmol/L — ABNORMAL HIGH (ref 20.0–28.0)
O2 Saturation: 46.1 %
Patient temperature: 98.6
pCO2, Ven: 60.6 mmHg — ABNORMAL HIGH (ref 44.0–60.0)
pH, Ven: 7.333 (ref 7.250–7.430)

## 2019-06-21 LAB — LIPASE, BLOOD: Lipase: 17 U/L (ref 11–51)

## 2019-06-21 LAB — ETHANOL: Alcohol, Ethyl (B): <10 mg/dL (ref <10)

## 2019-06-21 MED ORDER — SODIUM CHLORIDE 0.9 % IV BOLUS
1000.0000 mL | Freq: Once | INTRAVENOUS | Status: AC
  Administered 2019-06-21: 1000 mL via INTRAVENOUS

## 2019-06-21 MED ORDER — LACTATED RINGERS IV BOLUS
1000.0000 mL | Freq: Once | INTRAVENOUS | Status: AC
  Administered 2019-06-21: 1000 mL via INTRAVENOUS

## 2019-06-21 MED ORDER — DROPERIDOL 2.5 MG/ML IJ SOLN
2.5000 mg | Freq: Once | INTRAMUSCULAR | Status: AC
  Administered 2019-06-21: 2.5 mg via INTRAVENOUS
  Filled 2019-06-21: qty 2

## 2019-06-21 NOTE — ED Triage Notes (Signed)
Patient is complaining of nausea, vomiting, and mid back pain. Patient is crying and stating she is in pain.

## 2019-06-21 NOTE — ED Notes (Signed)
Lab called to report critical lab, PO2 below reportable range. Notified provider.

## 2019-06-21 NOTE — ED Provider Notes (Signed)
[facility] DEPT Provider Note   CSN: [id number] Arrival date & time: 06/21/19  1926     History Chief Complaint  Patient presents with  . Emesis  . Nausea  . Back Pain    [name] is a 29 y.o. female.  HPI   Presents with concern nausea, vomiting, back pain, abdominal discomfort.  Patient has history of cyclic nausea, vomiting, chronic pain.  She was recently admitted to the hospital by myself, and notes that after that hospitalization she was transiently well.  However, over the past day or so she has developed recurrence of her typical symptoms including anorexia, nausea, vomiting, diarrhea.  She was that her pain is more in the back and front, but is severe, not improved with electrical stimulator devices. No fever, no chest pain, no dyspnea. With concern for inability to tolerate medication she presents for evaluation.  Past Medical History:  Diagnosis Date  . Allergy   . Anemia   . Anxiety   . Blood transfusion without reported diagnosis    Dec 2018  . Cataract    right eye  . COVID-19   . Depression   . Diabetes type 1, uncontrolled ([place]) 11/14/2011   Since age 60  . Fibromyalgia   . Gastroparesis   . GERD (gastroesophageal reflux disease)   . Hypertension   . Infection    UTI April 2016    Patient Active Problem List   Diagnosis Date Noted  . Intractable cyclical vomiting with nausea 06/01/2019  . COVID-19 virus infection 05/31/2019  . Nausea & vomiting 04/30/2019  . Hypertensive urgency 04/30/2019  . Cannabinoid hyperemesis syndrome 04/30/2019  . Contraception management 08/06/2018  . Cyclical vomiting syndrome 06/07/2018  . Diabetic gastroparesis associated with type 1 diabetes mellitus ([place]) 06/04/2018  . Anxiety 05/31/2018  . Peripheral edema 05/31/2018  . Normocytic anemia 07/10/2017  . Hypokalemia due to excessive gastrointestinal loss of potassium 06/23/2017  . GERD (gastroesophageal reflux disease)  06/23/2017  . Intractable nausea and vomiting 04/11/2017  . 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 Diabetes mellitus type 1 ([place]) 04/28/2013  . Protein-calorie malnutrition, severe ([place]) 04/28/2013  . Hypertension associated with diabetes ([place]) 11/14/2011  . Chronic abdominal pain 09/18/2011    Past Surgical History:  Procedure Laterality Date  . ANKLE SURGERY    . CHOLECYSTECTOMY  11/15/2011   Procedure: LAPAROSCOPIC CHOLECYSTECTOMY WITH INTRAOPERATIVE CHOLANGIOGRAM;  Surgeon: [name], MD;  Location: [facility];  Service: General;  Laterality: N/A;  . COLONOSCOPY    . COLONOSCOPY WITH PROPOFOL N/A 06/27/2017   Procedure: COLONOSCOPY WITH PROPOFOL;  Surgeon: Milus Banister, MD;  Location: [facility];  Service: Endoscopy;  Laterality: N/A;  . ESOPHAGOGASTRODUODENOSCOPY  12/03/2011   Procedure: ESOPHAGOGASTRODUODENOSCOPY (EGD);  Surgeon: Beryle Beams, MD;  Location: [facility];  Service: Endoscopy;  Laterality: N/A;  . FLEXIBLE SIGMOIDOSCOPY N/A 03/10/2017   Procedure: FLEXIBLE SIGMOIDOSCOPY;  Surgeon: Carol Ada, MD;  Location: [facility];  Service: Endoscopy;  Laterality: N/A;  . INCISION AND DRAINAGE PERIRECTAL ABSCESS N/A 03/01/2017   Procedure: IRRIGATION AND DEBRIDEMENT PERIRECTAL ABSCESS;  Surgeon: Alphonsa Overall, MD;  Location: [facility];  Service: General;  Laterality: N/A;  . IRRIGATION AND DEBRIDEMENT BUTTOCKS N/A 03/23/2017   Procedure: IRRIGATION AND DEBRIDEMENT BUTTOCKS, SETON PLACEMENT;  Surgeon: [name], MD;  Location: [facility];  Service: General;  Laterality: N/A;  . LAPAROSCOPY  11/23/2011   Procedure: LAPAROSCOPY DIAGNOSTIC;  Surgeon: Edward Jolly, MD;  Location: [facility];  Service: General;  Laterality: N/A;  . SIGMOIDOSCOPY    . UPPER GASTROINTESTINAL ENDOSCOPY    . WISDOM TOOTH EXTRACTION       OB History    Gravida  1   Para  1   Term      Preterm  1   AB      Living  1     SAB      TAB       Ectopic      Multiple  0   Live Births  1           Family History  Problem Relation Age of Onset  . Diabetes Mother   . Hypertension Father   . Colon cancer Paternal Grandmother        pt thinks PGM was dx in her 2's  . Diabetes Paternal Grandmother   . Diabetes Maternal Grandmother   . Diabetes Maternal Grandfather   . Diabetes Paternal Grandfather   . Diabetes Other   . Esophageal cancer Neg Hx   . Liver cancer Neg Hx   . Pancreatic cancer Neg Hx   . Stomach cancer Neg Hx   . Rectal cancer Neg Hx     Social History   Tobacco Use  . Smoking status: Never Smoker  . Smokeless tobacco: Never Used  Substance Use Topics  . Alcohol use: No    Alcohol/week: 0.0 standard drinks  . Drug use: Yes    Types: Marijuana    Home Medications Prior to Admission medications   Medication Sig Start Date End Date Taking? Authorizing Provider  cyclobenzaprine (FLEXERIL) 10 MG tablet Take 1 tablet (10 mg total) by mouth 3 (three) times daily as needed for muscle spasms. 06/07/19  Yes [name], MD  dicyclomine (BENTYL) 20 MG tablet Take 1 tablet (20 mg total) by mouth 3 (three) times daily as needed for spasms. 05/01/19  Yes [name], MD  furosemide (LASIX) 40 MG tablet TAKE 1 TABLET BY MOUTH  DAILY Patient taking differently: Take 40 mg by mouth daily.  11/11/18  Yes Armbruster, Carlota Raspberry, MD  hydrALAZINE (APRESOLINE) 50 MG tablet Take 1 tablet (50 mg total) by mouth every 6 (six) hours. Patient taking differently: Take 50 mg by mouth 2 (two) times daily.  10/23/18  Yes Swayze, Ava, DO  Insulin Glargine (LANTUS SOLOSTAR) 100 UNIT/ML Solostar Pen Inject 10 Units into the skin at bedtime. And pen needles 4/day Patient taking differently: Inject 12 Units into the skin at bedtime. And pen needles 4/day 12/31/18  Yes Renato Shin, MD  insulin lispro (HUMALOG KWIKPEN) 100 UNIT/ML KwikPen Inject 0.06 mLs (6 Units total) into the skin 3 (three) times daily. Patient taking  differently: Inject 1-9 Units into the skin 3 (three) times daily. Per sliding scale 12/31/18  Yes Renato Shin, MD  loperamide (IMODIUM) 2 MG capsule Take 6 mg by mouth as needed for diarrhea or loose stools.   Yes [provider]  LORazepam (ATIVAN) 0.5 MG tablet Take 1 tablet (0.5 mg total) by mouth 2 (two) times daily as needed for anxiety. 04/05/19  Yes [name], MD  metoprolol tartrate (LOPRESSOR) 25 MG tablet Take 1 tablet (25 mg total) by mouth 2 (two) times daily. 05/22/19 06/21/19 Yes Arrien, Jimmy Picket, MD  mirtazapine (REMERON) 30 MG tablet TAKE 1 TABLET BY MOUTH AT  BEDTIME Patient taking differently: Take 30 mg by mouth at bedtime.  04/19/19  Yes [name],  MD  ondansetron (ZOFRAN) 4 MG tablet Take 1 tablet (4 mg total) by mouth every 8 (eight) hours as needed for nausea or vomiting. 05/22/19  Yes Arrien, Jimmy Picket, MD  OVER THE COUNTER MEDICATION Take 2 each by mouth daily. Flo Chewable Gummies for period   Yes [provider]  pantoprazole (PROTONIX) 40 MG tablet TAKE 1 TABLET BY MOUTH  DAILY Patient taking differently: Take 40 mg by mouth daily.  10/22/18  Yes Armbruster, Carlota Raspberry, MD  promethazine (PHENERGAN) 25 MG suppository Place 1 suppository (25 mg total) rectally every 6 (six) hours as needed for nausea or vomiting. 05/28/19  Yes Rancour, Annie Main, MD  promethazine (PHENERGAN) 25 MG suppository Place 1 suppository (25 mg total) rectally every 6 (six) hours as needed for nausea or vomiting. 05/29/19  Yes Cardama, Grayce Sessions, MD  Continuous Blood Gluc Receiver ([place]) [place] See admin instructions. USE AS DIRECTED TO MONITOR GLUCOSE LEVELS 10/29/18   [provider]  Continuous Blood Gluc Sensor (DEXCOM G6 SENSOR) MISC 1 each by Does not apply route See admin instructions. every 10 days. 12/31/18   Renato Shin, MD  Continuous Blood Gluc Transmit (DEXCOM G6 TRANSMITTER) MISC See admin instructions. USE AS DIRECTED TO MONITOR  GLUCOSE LEVELS 10/29/18   [provider]  metoCLOPramide (REGLAN) 10 MG tablet Take 1 tablet (10 mg total) by mouth every 6 (six) hours as needed for nausea or vomiting. Patient not taking: Reported on 04/23/2019 02/20/18 04/23/19  Shelly Coss, MD  potassium chloride 20 MEQ TBCR Take 20 mEq by mouth 2 (two) times daily. Patient not taking: Reported on 03/31/2019 10/17/18 04/23/19  Isla Pence, MD  potassium chloride SA (KLOR-CON) 20 MEQ tablet Take 1 tablet (20 mEq total) by mouth daily. Patient not taking: Reported on 03/31/2019 03/14/19 04/23/19  Drenda Freeze, MD    Allergies    Other and Lactose intolerance (gi)  Review of Systems   Review of Systems  Constitutional:       Per HPI, otherwise negative  HENT:       Per HPI, otherwise negative  Respiratory:       Per HPI, otherwise negative  Cardiovascular:       Per HPI, otherwise negative  Gastrointestinal: Positive for abdominal pain, nausea and vomiting.  Endocrine:       Negative aside from HPI  Genitourinary:       Neg aside from HPI   Musculoskeletal:       Per HPI, otherwise negative  Skin: Negative.   Neurological: Positive for weakness. Negative for syncope.    Physical Exam Updated Vital Signs BP (!) 171/119   Pulse (!) 108   Temp 98.1 F (36.7 C) (Oral)   Resp 18   Ht 5' 7"  (1.702 m)   Wt 54.4 kg   LMP 05/21/2019   SpO2 100%   BMI 18.79 kg/m   Physical Exam Vitals and nursing note reviewed.  Constitutional:      Appearance: She is well-developed.     Comments: Uncomfortable appearing thin adult female walking about the room, speaking clearly, though tearfully.  HENT:     Head: Normocephalic and atraumatic.  Eyes:     Conjunctiva/sclera: Conjunctivae normal.  Cardiovascular:     Rate and Rhythm: Normal rate and regular rhythm.  Pulmonary:     Effort: Pulmonary effort is normal. No respiratory distress.     Breath sounds: Normal breath sounds. No stridor.  Abdominal:      General: There  is no distension.     Tenderness: There is no abdominal tenderness. There is no guarding.  Skin:    General: Skin is warm and dry.  Neurological:     Mental Status: She is alert and oriented to person, place, and time.     Cranial Nerves: No cranial nerve deficit.  Psychiatric:        Behavior: Behavior is slowed and withdrawn.     ED Results / Procedures / Treatments   Labs (all labs ordered are listed, but only abnormal results are displayed) Labs Reviewed  COMPREHENSIVE METABOLIC PANEL - Abnormal; Notable for the following components:      Result Value   Glucose, Bld 186 (*)    BUN 25 (*)    Total Protein 8.3 (*)    Albumin 2.9 (*)    AST 51 (*)    ALT 51 (*)    All other components within normal limits  CBC WITH DIFFERENTIAL/PLATELET - Abnormal; Notable for the following components:   RBC 3.13 (*)    Hemoglobin 8.6 (*)    HCT 28.1 (*)    All other components within normal limits  BLOOD GAS, VENOUS - Abnormal; Notable for the following components:   pCO2, Ven 60.6 (*)    Bicarbonate 31.3 (*)    Acid-Base Excess 4.1 (*)    All other components within normal limits  ETHANOL  LIPASE, BLOOD     Procedures Procedures (including critical care time)  Medications Ordered in ED Medications  sodium chloride 0.9 % bolus 1,000 mL (1,000 mLs Intravenous New Bag/Given 06/21/19 2105)  droperidol (INAPSINE) 2.5 MG/ML injection 2.5 mg (2.5 mg Intravenous Given 06/21/19 2114)  lactated ringers bolus 1,000 mL (1,000 mLs Intravenous New Bag/Given 06/21/19 2238)    ED Course  I have reviewed the triage vital signs and the nursing notes.  Pertinent labs & imaging results that were available during my care of the patient were reviewed by me and considered in my medical decision making (see chart for details).    MDM Rules/Calculators/A&P                     Chart review notable for 22 prior visits in the past 6 months including admission earlier this month. 10:52  PM Patient smiling.  Labs reviewed.  Patient has no additional episodes of vomiting, has received fluids, droperidol, has had substantial improvement in her condition.  Given this, and with a known history of cyclic vomiting, suspicion for that etiology contributing to today's presentation, but now with improvement, no other notable findings, patient discharged in stable condition. Final Clinical Impression(s) / ED Diagnoses Final diagnoses:  Nausea vomiting and diarrhea      Carmin Muskrat, MD 06/21/19 2253

## 2019-06-21 NOTE — Discharge Instructions (Addendum)
As discussed, your evaluation today has been largely reassuring.  But, it is important that you monitor your condition carefully, and do not hesitate to return to the ED if you develop new, or concerning changes in your condition. ? ?Otherwise, please follow-up with your physician for appropriate ongoing care. ? ?

## 2019-06-22 ENCOUNTER — Other Ambulatory Visit: Payer: Self-pay

## 2019-06-22 ENCOUNTER — Emergency Department (HOSPITAL_COMMUNITY): Admission: EM | Admit: 2019-06-22 | Discharge: 2019-06-22 | Discharge status: Absent without leave | Payer: 59

## 2019-06-23 ENCOUNTER — Encounter (HOSPITAL_COMMUNITY): Payer: Self-pay | Admitting: Emergency Medicine

## 2019-06-23 ENCOUNTER — Emergency Department (HOSPITAL_COMMUNITY)
Admission: EM | Admit: 2019-06-23 | Discharge: 2019-06-23 | Disposition: A | Discharge status: Home / Self Care | Payer: 59 | Attending: Emergency Medicine | Admitting: Emergency Medicine

## 2019-06-23 ENCOUNTER — Other Ambulatory Visit: Payer: Self-pay

## 2019-06-23 DIAGNOSIS — E109 Type 1 diabetes mellitus without complications: Secondary | ICD-10-CM | POA: Insufficient documentation

## 2019-06-23 DIAGNOSIS — R112 Nausea with vomiting, unspecified: Secondary | ICD-10-CM | POA: Insufficient documentation

## 2019-06-23 DIAGNOSIS — R197 Diarrhea, unspecified: Secondary | ICD-10-CM | POA: Insufficient documentation

## 2019-06-23 DIAGNOSIS — Z79899 Other long term (current) drug therapy: Secondary | ICD-10-CM | POA: Diagnosis not present

## 2019-06-23 DIAGNOSIS — Z794 Long term (current) use of insulin: Secondary | ICD-10-CM | POA: Insufficient documentation

## 2019-06-23 DIAGNOSIS — F1210 Cannabis abuse, uncomplicated: Secondary | ICD-10-CM | POA: Diagnosis not present

## 2019-06-23 DIAGNOSIS — I10 Essential (primary) hypertension: Secondary | ICD-10-CM | POA: Diagnosis not present

## 2019-06-23 LAB — CBC WITH DIFFERENTIAL/PLATELET
Abs Immature Granulocytes: 0.03 10*3/uL (ref 0.00–0.07)
Basophils Absolute: 0 10*3/uL (ref 0.0–0.1)
Basophils Relative: 1 %
Eosinophils Absolute: 0 10*3/uL (ref 0.0–0.5)
Eosinophils Relative: 0 %
HCT: 30.2 % — ABNORMAL LOW (ref 36.0–46.0)
Hemoglobin: 9.1 g/dL — ABNORMAL LOW (ref 12.0–15.0)
Immature Granulocytes: 0 %
Lymphocytes Relative: 16 %
Lymphs Abs: 1.3 10*3/uL (ref 0.7–4.0)
MCH: 27.5 pg (ref 26.0–34.0)
MCHC: 30.1 g/dL (ref 30.0–36.0)
MCV: 91.2 fL (ref 80.0–100.0)
Monocytes Absolute: 0.4 10*3/uL (ref 0.1–1.0)
Monocytes Relative: 5 %
Neutro Abs: 6.3 10*3/uL (ref 1.7–7.7)
Neutrophils Relative %: 78 %
Platelets: 369 10*3/uL (ref 150–400)
RBC: 3.31 MIL/uL — ABNORMAL LOW (ref 3.87–5.11)
RDW: 13.1 % (ref 11.5–15.5)
WBC: 8 10*3/uL (ref 4.0–10.5)
nRBC: 0 % (ref 0.0–0.2)

## 2019-06-23 LAB — URINALYSIS, ROUTINE W REFLEX MICROSCOPIC
Bilirubin Urine: NEGATIVE
Glucose, UA: 50 mg/dL — AB
Ketones, ur: 5 mg/dL — AB
Leukocytes,Ua: NEGATIVE
Nitrite: NEGATIVE
Protein, ur: >=300 mg/dL — AB
Specific Gravity, Urine: 1.02 (ref 1.005–1.030)
pH: 5 (ref 5.0–8.0)

## 2019-06-23 LAB — COMPREHENSIVE METABOLIC PANEL
ALT: 37 U/L (ref 0–44)
AST: 40 U/L (ref 15–41)
Albumin: 3 g/dL — ABNORMAL LOW (ref 3.5–5.0)
Alkaline Phosphatase: 88 U/L (ref 38–126)
Anion gap: 14 (ref 5–15)
BUN: 35 mg/dL — ABNORMAL HIGH (ref 6–20)
CO2: 25 mmol/L (ref 22–32)
Calcium: 9.1 mg/dL (ref 8.9–10.3)
Chloride: 100 mmol/L (ref 98–111)
Creatinine, Ser: 1.27 mg/dL — ABNORMAL HIGH (ref 0.44–1.00)
GFR calc Af Amer: >60 mL/min (ref >60)
GFR calc non Af Amer: 57 mL/min — ABNORMAL LOW (ref >60)
Glucose, Bld: 192 mg/dL — ABNORMAL HIGH (ref 70–99)
Potassium: 4.4 mmol/L (ref 3.5–5.1)
Sodium: 139 mmol/L (ref 135–145)
Total Bilirubin: 0.9 mg/dL (ref 0.3–1.2)
Total Protein: 8.6 g/dL — ABNORMAL HIGH (ref 6.5–8.1)

## 2019-06-23 LAB — PREGNANCY, URINE: Preg Test, Ur: NEGATIVE

## 2019-06-23 MED ORDER — DROPERIDOL 2.5 MG/ML IJ SOLN
2.5000 mg | Freq: Once | INTRAMUSCULAR | Status: AC
  Administered 2019-06-23: 2.5 mg via INTRAVENOUS
  Filled 2019-06-23: qty 2

## 2019-06-23 MED ORDER — SODIUM CHLORIDE 0.9 % IV BOLUS
500.0000 mL | Freq: Once | INTRAVENOUS | Status: AC
  Administered 2019-06-23: 500 mL via INTRAVENOUS

## 2019-06-23 MED ORDER — DIPHENHYDRAMINE HCL 50 MG/ML IJ SOLN
12.5000 mg | Freq: Once | INTRAMUSCULAR | Status: AC
  Administered 2019-06-23: 12.5 mg via INTRAVENOUS
  Filled 2019-06-23: qty 1

## 2019-06-23 MED ORDER — KETOROLAC TROMETHAMINE 30 MG/ML IJ SOLN
15.0000 mg | Freq: Once | INTRAMUSCULAR | Status: AC
  Administered 2019-06-23: 15 mg via INTRAVENOUS
  Filled 2019-06-23: qty 1

## 2019-06-23 MED ORDER — SUCRALFATE 1 GM/10ML PO SUSP: 1.0000 g | Freq: Three times a day (TID) | ORAL | 0 refills | Status: DC

## 2019-06-23 MED ORDER — METOCLOPRAMIDE HCL 10 MG PO TABS: 10.0000 mg | ORAL_TABLET | Freq: Three times a day (TID) | ORAL | 0 refills | Status: DC | PRN

## 2019-06-23 MED ORDER — PROMETHAZINE HCL 25 MG PO TABS: 25.0000 mg | ORAL_TABLET | Freq: Three times a day (TID) | ORAL | 0 refills | Status: DC | PRN

## 2019-06-23 MED ORDER — LIDOCAINE 5 % EX PTCH
1.0000 | MEDICATED_PATCH | CUTANEOUS | Status: DC
  Administered 2019-06-23: 1 via TRANSDERMAL
  Filled 2019-06-23: qty 1

## 2019-06-23 MED ORDER — HYOSCYAMINE SULFATE 0.125 MG SL SUBL
0.2500 mg | SUBLINGUAL_TABLET | Freq: Once | SUBLINGUAL | Status: AC
  Administered 2019-06-23: 0.25 mg via SUBLINGUAL
  Filled 2019-06-23: qty 2

## 2019-06-23 MED ORDER — ALUM & MAG HYDROXIDE-SIMETH 200-200-20 MG/5ML PO SUSP
30.0000 mL | Freq: Once | ORAL | Status: AC
  Administered 2019-06-23: 30 mL via ORAL
  Filled 2019-06-23: qty 30

## 2019-06-23 NOTE — ED Notes (Signed)
Pt ambulatory to bathroom, no assistance needed.

## 2019-06-23 NOTE — ED Notes (Signed)
IV team at bedside 

## 2019-06-23 NOTE — ED Provider Notes (Signed)
Care transferred to me.  Plan is for fluids and treatment with droperidol and Benadryl.  She tolerated GI cocktail okay.  Unfortunately IV team not able to get an IV.  I placed EJ.  Angiocath insertion Performed by: Ephraim Hamburger  Consent: Verbal consent obtained. Risks and benefits: risks, benefits and alternatives were discussed Time out: Immediately prior to procedure a "time out" was called to verify the correct patient, procedure, equipment, support staff and site/side marked as required.  Preparation: Patient was prepped and draped in the usual sterile fashion.  Vein Location: left EJ  Ultrasound Guided  Gauge: 20  Normal blood return and flush without difficulty Patient tolerance: Patient tolerated the procedure well with no immediate complications.  EJ unfortunately blue when the nurse went to start the medicines.  Second IV placed by myself.  Angiocath insertion Performed by: Ephraim Hamburger  Consent: Verbal consent obtained. Risks and benefits: risks, benefits and alternatives were discussed Time out: Immediately prior to procedure a "time out" was called to verify the correct patient, procedure, equipment, support staff and site/side marked as required.  Preparation: Patient was prepped and draped in the usual sterile fashion.  Vein Location: right basilic  Ultrasound Guided  Gauge: 20  Normal blood return and flush without difficulty Patient tolerance: Patient tolerated the procedure well with no immediate complications.   After treatment, patient is now feeling much better and wants to go home.  She remains tachycardic after a 500 cc bolus.  Offered more fluids but she is definitively wanting to go home.  Feels much better.  I doubt emergent abdominal condition.  This is a recurrent issue for her.  Discussed discharge and return precautions.  Will refill her Phenergan.     Sherwood Gambler, MD 06/23/19 1032

## 2019-06-23 NOTE — ED Provider Notes (Addendum)
[facility] DEPT Provider Note   CSN: [id number] Arrival date & time: 06/23/19  0500     History Chief Complaint  Patient presents with  . Nausea  . Emesis    [name] is a 29 y.o. female.  The history is provided by the patient.  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  These are her typical symptoms.  No f/c/r.  No CP, no SOb.  No abdominal pain.       Past Medical History:  Diagnosis Date  . Allergy   . Anemia   . Anxiety   . Blood transfusion without reported diagnosis    Dec 2018  . Cataract    right eye  . COVID-19   . Depression   . Diabetes type 1, uncontrolled ([place]) 11/14/2011   Since age 68  . Fibromyalgia   . Gastroparesis   . GERD (gastroesophageal reflux disease)   . Hypertension   . Infection    [facility]I [place] [place] 2016    Patient Active Problem List   Diagnosis Date Noted  . Intractable cyclical vomiting with nausea 06/01/2019  . COVID-19 virus infection 05/31/2019  . Nausea & vomiting 04/30/2019  . Hypertensive urgency 04/30/2019  . Cannabinoid hyperemesis syndrome 04/30/2019  . Contraception management 08/06/2018  . Cyclical vomiting syndrome 06/07/2018  . Diabetic gastroparesis associated with type 1 diabetes mellitus ([place]) 06/04/2018  . Anxiety 05/31/2018  . Peripheral edema 05/31/2018  . Normocytic anemia 07/10/2017  . Hypokalemia due to excessive gastrointestinal loss of potassium  06/23/2017  . GERD (gastroesophageal reflux disease) 06/23/2017  . Intractable nausea and vomiting 04/11/2017  . MDD (major depressive disorder), recurrent episode, mild ([place]) 02/28/2017  . Diabetes mellitus type 1 ([place]) 04/28/2013  . Protein-calorie malnutrition, severe ([place]) 04/28/2013  . Hypertension associated with diabetes ([place]) 11/14/2011  . Chronic abdominal pain 09/18/2011    Past Surgical History:  Procedure Laterality Date  . ANKLE SURGERY    . CHOLECYSTECTOMY  11/15/2011   Procedure: LAPAROSCOPIC CHOLECYSTECTOMY WITH INTRAOPERATIVE CHOLANGIOGRAM;  Surgeon: [name], MD;  Location: [facility];  Service: General;  Laterality: N/A;  . COLONOSCOPY    . COLONOSCOPY WITH PROPOFOL N/A 06/27/2017   Procedure: COLONOSCOPY WITH PROPOFOL;  Surgeon: Milus Banister, MD;  Location: [facility];  Service: Endoscopy;  Laterality: N/A;  . ESOPHAGOGASTRODUODENOSCOPY  12/03/2011   Procedure: ESOPHAGOGASTRODUODENOSCOPY (EGD);  Surgeon: Beryle Beams, MD;  Location: [facility];  Service: Endoscopy;  Laterality: N/A;  . FLEXIBLE SIGMOIDOSCOPY N/A 03/10/2017   Procedure: FLEXIBLE SIGMOIDOSCOPY;  Surgeon: Carol Ada, MD;  Location: [facility];  Service: Endoscopy;  Laterality: N/A;  . INCISION AND DRAINAGE PERIRECTAL ABSCESS N/A 03/01/2017   Procedure: IRRIGATION AND DEBRIDEMENT PERIRECTAL ABSCESS;  Surgeon: Alphonsa Overall, MD;  Location: [facility];  Service: General;  Laterality: N/A;  . IRRIGATION AND DEBRIDEMENT BUTTOCKS N/A 03/23/2017   Procedure: IRRIGATION AND DEBRIDEMENT BUTTOCKS, SETON PLACEMENT;  Surgeon: [name], MD;  Location: [facility];  Service: General;  Laterality:  N/A;  . LAPAROSCOPY  11/23/2011   Procedure: LAPAROSCOPY DIAGNOSTIC;  Surgeon: Edward Jolly, MD;  Location: [facility];  Service: General;  Laterality: N/A;  . SIGMOIDOSCOPY    . UPPER GASTROINTESTINAL ENDOSCOPY    . WISDOM TOOTH EXTRACTION       OB History    Gravida  1   Para  1   Term      Preterm    1   AB      Living  1     SAB      TAB      Ectopic      Multiple  0   Live Births  1           Family History  Problem Relation Age of Onset  . Diabetes Mother   . Hypertension Father   . Colon cancer Paternal Grandmother        pt thinks PGM was dx in her 29's  . Diabetes Paternal Grandmother   . Diabetes Maternal Grandmother   . Diabetes Maternal Grandfather   . Diabetes Paternal Grandfather   . Diabetes Other   . Esophageal cancer Neg Hx   . Liver cancer Neg Hx   . Pancreatic cancer Neg Hx   . Stomach cancer Neg Hx   . Rectal cancer Neg Hx     Social History   Tobacco Use  . Smoking status: Never Smoker  . Smokeless tobacco: Never Used  Substance Use Topics  . Alcohol use: No    Alcohol/week: 0.0 standard drinks  . Drug use: Yes    Types: Marijuana    Home Medications Prior to Admission medications   Medication Sig Start Date End Date Taking? Authorizing Provider  Continuous Blood Gluc Receiver ([place]) [place] See admin instructions. USE AS DIRECTED TO MONITOR GLUCOSE LEVELS 10/29/18   [provider]  Continuous Blood Gluc Sensor (DEXCOM G6 SENSOR) MISC 1 each by Does not apply route See admin instructions. every 10 days. 12/31/18   Renato Shin, MD  Continuous Blood Gluc Transmit (DEXCOM G6 TRANSMITTER) MISC See admin instructions. USE AS DIRECTED TO MONITOR GLUCOSE LEVELS 10/29/18   [provider]  cyclobenzaprine (FLEXERIL) 10 MG tablet Take 1 tablet (10 mg total) by mouth 3 (three) times daily as needed for muscle spasms. 06/07/19   [name], MD  dicyclomine (BENTYL) 20 MG tablet Take 1 tablet (20 mg total) by mouth 3 (three) times daily as needed for spasms. 05/01/19   [name], MD  furosemide (LASIX) 40 MG tablet TAKE 1 TABLET BY MOUTH  DAILY Patient taking differently: Take 40 mg by mouth daily.  11/11/18   Armbruster, Carlota Raspberry, MD  hydrALAZINE (APRESOLINE) 50 MG tablet Take 1 tablet (50 mg total) by  mouth every 6 (six) hours. Patient taking differently: Take 50 mg by mouth 2 (two) times daily.  10/23/18   Swayze, Ava, DO  Insulin Glargine (LANTUS SOLOSTAR) 100 UNIT/ML Solostar Pen Inject 10 Units into the skin at bedtime. And pen needles 4/day Patient taking differently: Inject 12 Units into the skin at bedtime. And pen needles 4/day 12/31/18   Renato Shin, MD  insulin lispro (HUMALOG KWIKPEN) 100 UNIT/ML KwikPen Inject 0.06 mLs (6 Units total) into the skin 3 (three) times daily. Patient taking differently: Inject 1-9 Units into the skin 3 (three) times daily. Per sliding scale 12/31/18   Renato Shin, MD  loperamide (IMODIUM) 2 MG capsule Take 6 mg by mouth as needed  for diarrhea or loose stools.    [provider]  LORazepam (ATIVAN) 0.5 MG tablet Take 1 tablet (0.5 mg total) by mouth 2 (two) times daily as needed for anxiety. 04/05/19   [name], MD  metoCLOPramide (REGLAN) 10 MG tablet Take 1 tablet (10 mg total) by mouth every 8 (eight) hours as needed for nausea (nausea/headache). 06/23/19   Jaymie Misch, MD  metoprolol tartrate (LOPRESSOR) 25 MG tablet Take 1 tablet (25 mg total) by mouth 2 (two) times daily. 05/22/19 06/21/19  Arrien, Jimmy Picket, MD  mirtazapine (REMERON) 30 MG tablet TAKE 1 TABLET BY MOUTH AT  BEDTIME Patient taking differently: Take 30 mg by mouth at bedtime.  04/19/19   [name], MD  ondansetron (ZOFRAN) 4 MG tablet Take 1 tablet (4 mg total) by mouth every 8 (eight) hours as needed for nausea or vomiting. 05/22/19   Arrien, Jimmy Picket, MD  OVER THE COUNTER MEDICATION Take 2 each by mouth daily. Flo Chewable Gummies for period    [provider]  pantoprazole (PROTONIX) 40 MG tablet TAKE 1 TABLET BY MOUTH  DAILY Patient taking differently: Take 40 mg by mouth daily.  10/22/18   Armbruster, Carlota Raspberry, MD  promethazine (PHENERGAN) 25 MG suppository Place 1 suppository (25 mg total) rectally every 6 (six) hours as needed for nausea or  vomiting. 05/28/19   Rancour, Annie Main, MD  promethazine (PHENERGAN) 25 MG suppository Place 1 suppository (25 mg total) rectally every 6 (six) hours as needed for nausea or vomiting. 05/29/19   Cardama, Grayce Sessions, MD  sucralfate (CARAFATE) 1 GM/10ML suspension Take 10 mLs (1 g total) by mouth 4 (four) times daily -  with meals and at bedtime. 06/23/19   Chazz Philson, MD  potassium chloride 20 MEQ TBCR Take 20 mEq by mouth 2 (two) times daily. Patient not taking: Reported on 03/31/2019 10/17/18 04/23/19  Isla Pence, MD  potassium chloride SA (KLOR-CON) 20 MEQ tablet Take 1 tablet (20 mEq total) by mouth daily. Patient not taking: Reported on 03/31/2019 03/14/19 04/23/19  Drenda Freeze, MD    Allergies    Other and Lactose intolerance (gi)  Review of Systems   Review of Systems  Constitutional: Negative for fever.  HENT: Negative for sore throat.   Eyes: Positive for visual disturbance.  Respiratory: Negative for cough and shortness of breath.   Cardiovascular: Negative for chest pain, palpitations and leg swelling.  Gastrointestinal: Positive for diarrhea, nausea and vomiting. Negative for abdominal pain.  Genitourinary: Negative for difficulty urinating.  Musculoskeletal: Negative for gait problem.  Neurological: Negative for dizziness.  Psychiatric/Behavioral: Negative for agitation.  All other systems reviewed and are negative.   Physical Exam Updated Vital Signs BP (!) 173/116 (BP Location: Right Arm)   Pulse (!) 119   Temp 97.6 F (36.4 C) (Oral)   Resp 18   Ht 5' 7"  (1.702 m)   Wt 54.4 kg   SpO2 100%   BMI 18.79 kg/m   Physical Exam Vitals and nursing note reviewed.  Constitutional:      General: She is not in acute distress.    Appearance: Normal appearance.  HENT:     Head: Normocephalic and atraumatic.     Nose: Nose normal.  Eyes:     Conjunctiva/sclera: Conjunctivae normal.     Pupils: Pupils are equal, round, and reactive to light.    Cardiovascular:     Rate and Rhythm: Regular rhythm. Tachycardia present.     Pulses: Normal  pulses.     Heart sounds: Normal heart sounds.  Pulmonary:     Effort: Pulmonary effort is normal.     Breath sounds: Normal breath sounds.  Abdominal:     General: Abdomen is flat. Bowel sounds are normal.     Palpations: Abdomen is soft.     Tenderness: There is no abdominal tenderness. There is no guarding or rebound.  Musculoskeletal:        General: No tenderness. Normal range of motion.     Cervical back: Normal, normal range of motion and neck supple.     Thoracic back: Normal.     Lumbar back: Normal.     Right lower leg: No edema.     Left lower leg: No edema.  Skin:    General: Skin is warm and dry.     Capillary Refill: Capillary refill takes less than 2 seconds.  Neurological:     General: No focal deficit present.     Mental Status: She is alert and oriented to person, place, and time.     Gait: Gait normal.     Deep Tendon Reflexes: Reflexes normal.  Psychiatric:        Mood and Affect: Affect is labile.     ED Results / Procedures / Treatments   Labs (all labs ordered are listed, but only abnormal results are displayed) Results for orders placed or performed during the hospital encounter of 06/23/19  Comprehensive metabolic panel  Result Value Ref Range   Sodium 139 135 - 145 mmol/L   Potassium 4.4 3.5 - 5.1 mmol/L   Chloride 100 98 - 111 mmol/L   CO2 25 22 - 32 mmol/L   Glucose, Bld 192 (H) 70 - 99 mg/dL   BUN 35 (H) 6 - 20 mg/dL   Creatinine, Ser 1.27 (H) 0.44 - 1.00 mg/dL   Calcium 9.1 8.9 - 10.3 mg/dL   Total Protein 8.6 (H) 6.5 - 8.1 g/dL   Albumin 3.0 (L) 3.5 - 5.0 g/dL   AST 40 15 - 41 U/L   ALT 37 0 - 44 U/L   Alkaline Phosphatase 88 38 - 126 U/L   Total Bilirubin 0.9 0.3 - 1.2 mg/dL   GFR calc non Af Amer 57 (L) >60 mL/min   GFR calc Af Amer >60 >60 mL/min   Anion gap 14 5 - 15  Urinalysis, Routine w reflex microscopic  Result Value Ref Range    Color, Urine YELLOW YELLOW   APPearance HAZY (A) CLEAR   Specific Gravity, Urine 1.020 1.005 - 1.030   pH 5.0 5.0 - 8.0   Glucose, UA 50 (A) NEGATIVE mg/dL   Hgb urine dipstick SMALL (A) NEGATIVE   Bilirubin Urine NEGATIVE NEGATIVE   Ketones, ur 5 (A) NEGATIVE mg/dL   Protein, ur >=300 (A) NEGATIVE mg/dL   Nitrite NEGATIVE NEGATIVE   Leukocytes,Ua NEGATIVE NEGATIVE   RBC / HPF 21-50 0 - 5 RBC/hpf   WBC, UA 6-10 0 - 5 WBC/hpf   Bacteria, UA RARE (A) NONE SEEN   Squamous Epithelial / LPF 0-5 0 - 5   Mucus PRESENT    Hyaline Casts, UA PRESENT   Pregnancy, urine  Result Value Ref Range   Preg Test, Ur NEGATIVE NEGATIVE   DG CHEST PORT 1 VIEW  Result Date: 06/01/2019 CLINICAL DATA:  COVID-19 detected EXAM: PORTABLE CHEST 1 VIEW COMPARISON:  Portable exam 1710 hours compared to 05/25/2019 FINDINGS: Slight rotation to the RIGHT. Normal heart size, mediastinal contours,  and pulmonary vascularity. Lungs clear. No pulmonary infiltrate, pleural effusion or pneumothorax. Osseous structures unremarkable. IMPRESSION: No acute abnormalities. Electronically Signed   By: Lavonia Dana M.D.   On: 06/01/2019 17:29   DG Chest Port 1 View  Result Date: 05/25/2019 CLINICAL DATA:  Shortness of breath, back pain, diabetic, COVID EXAM: PORTABLE CHEST 1 VIEW COMPARISON:  Radiograph 03/13/2019 FINDINGS: No consolidation, features of edema, pneumothorax, or effusion. Pulmonary vascularity is normally distributed. The cardiomediastinal contours are unremarkable. No acute osseous or soft tissue abnormality. Cholecystectomy clips present in the right upper quadrant. IMPRESSION: No acute cardiopulmonary abnormality. Electronically Signed   By: Lovena Le M.D.   On: 05/25/2019 22:31   DG ABD ACUTE 2+V W 1V CHEST  Result Date: 05/28/2019 CLINICAL DATA:  29 year old female with abdominal pain and vomiting EXAM: DG ABDOMEN ACUTE W/ 1V CHEST COMPARISON:  03/14/2019 FINDINGS: Chest: Cardiomediastinal silhouette within  normal limits. No pneumothorax pleural effusion or confluent airspace disease. Abdomen: Paucity of gas within small bowel colon. No distention. No air-fluid levels. No unexpected radiopaque foreign body, soft tissue density, or calcification. No displaced fracture. IMPRESSION: Chest: No radiographic evidence of acute cardiopulmonary disease. Abdomen: Nonobstructive bowel gas pattern. Electronically Signed   By: Corrie Mckusick D.O.   On: 05/28/2019 15:14   CT Renal Stone Study  Result Date: 05/31/2019 CLINICAL DATA:  29 year old female with nausea vomiting and flank pain. Kidney stone suspected. EXAM: CT ABDOMEN AND PELVIS WITHOUT CONTRAST TECHNIQUE: Multidetector CT imaging of the abdomen and pelvis was performed following the standard protocol without IV contrast. COMPARISON:  CT of the abdomen pelvis dated 10/21/2018. FINDINGS: Evaluation of this exam is limited in the absence of intravenous contrast. Lower chest: The visualized lung bases are clear. No intra-abdominal free air. There is diffuse mesenteric edema and small free fluid within the pelvis. Hepatobiliary: The liver is unremarkable. Cholecystectomy. No retained calcified stone noted in the central CBD. Pancreas: Unremarkable. No pancreatic ductal dilatation or surrounding inflammatory changes. Spleen: Normal in size without focal abnormality. Adrenals/Urinary Tract: The adrenal glands are unremarkable. There is no hydronephrosis or nephrolithiasis on either side. The visualized ureters and urinary bladder appear unremarkable. Stomach/Bowel: There is mild thickened appearance of the colon concerning for mild colitis. Clinical correlation is recommended. There is no bowel obstruction. The appendix is normal. Vascular/Lymphatic: The abdominal aorta and IVC are unremarkable. No portal venous gas. There is no adenopathy. Reproductive: Mild diffuse subcu scar a stat the uterus and ovaries are grossly unremarkable. Other: Mild diffuse subcutaneous edema.  Musculoskeletal: No acute or significant osseous findings. IMPRESSION: 1. Findings concerning for mild colitis. Clinical correlation is recommended. No bowel obstruction. Normal appendix. 2. No hydronephrosis or nephrolithiasis. Electronically Signed   By: Anner Crete M.D.   On: 05/31/2019 21:18    EKG  EKG Interpretation  Date/Time:  Thursday June 23 2019 06:48:05 EDT Ventricular Rate:  118 PR Interval:    QRS Duration: 76 QT Interval:  323 QTC Calculation: 453 R Axis:   76 Text Interpretation: Sinus tachycardia Confirmed by Dory Horn) on 06/23/2019 6:50:23 AM       Radiology No results found.  Procedures Procedures (including critical care time)  Medications Ordered in ED Medications  droperidol (INAPSINE) 2.5 MG/ML injection 2.5 mg (has no administration in time range)  sodium chloride 0.9 % bolus 500 mL (has no administration in time range)  hyoscyamine (LEVSIN SL) SL tablet 0.25 mg (has no administration in time range)  alum & mag hydroxide-simeth (MAALOX/MYLANTA) 200-200-20 MG/5ML  suspension 30 mL (has no administration in time range)  ketorolac (TORADOL) 30 MG/ML injection 15 mg (has no administration in time range)  diphenhydrAMINE (BENADRYL) injection 12.5 mg (has no administration in time range)  lidocaine (LIDODERM) 5 % 1 patch (has no administration in time range)    ED Course  I have reviewed the triage vital signs and the nursing notes.  Pertinent labs & imaging results that were available during my care of the patient were reviewed by me and considered in my medical decision making (see chart for details).    Patient has been seen 24 previous times in 6 months for this same thing.  Patient is well appearing, no hypokalemia, no emesis in the ED.  She is pacing in the suspect this is cannabis induced cyclic vomiting.  She is pending IV team consult  and medications, and reassessment  Final Clinical Impression(s) / ED Diagnoses Final diagnoses:    Nausea vomiting and diarrhea   Signed out to Dr. Regenia Skeeter pending medication       Sedalia Greeson, MD 06/23/19 [address], [place], MD 06/23/19 [id number]

## 2019-06-23 NOTE — Discharge Instructions (Addendum)
If you develop worsening, continued, or recurrent abdominal pain, uncontrolled vomiting, fever, chest or back pain, or any other new/concerning symptoms then return to the ER for evaluation.  

## 2019-06-24 ENCOUNTER — Other Ambulatory Visit: Payer: Self-pay

## 2019-06-24 ENCOUNTER — Other Ambulatory Visit: Payer: Self-pay | Admitting: Gastroenterology

## 2019-06-24 ENCOUNTER — Encounter (HOSPITAL_COMMUNITY): Payer: Self-pay | Admitting: *Deleted

## 2019-06-24 ENCOUNTER — Emergency Department (HOSPITAL_COMMUNITY)
Admission: EM | Admit: 2019-06-24 | Discharge: 2019-06-24 | Disposition: A | Discharge status: Home / Self Care | Payer: 59 | Attending: Emergency Medicine | Admitting: Emergency Medicine

## 2019-06-24 DIAGNOSIS — M549 Dorsalgia, unspecified: Secondary | ICD-10-CM | POA: Diagnosis not present

## 2019-06-24 DIAGNOSIS — Z5321 Procedure and treatment not carried out due to patient leaving prior to being seen by health care provider: Secondary | ICD-10-CM | POA: Diagnosis not present

## 2019-06-24 NOTE — ED Triage Notes (Signed)
C/o back pain; denies injury

## 2019-06-24 NOTE — ED Notes (Signed)
Pt did not respond when called for vitals check 

## 2019-06-24 NOTE — ED Notes (Signed)
Pt did not respond when called for final vitals check

## 2019-06-30 ENCOUNTER — Encounter: Payer: Self-pay | Admitting: Family Medicine

## 2019-06-30 ENCOUNTER — Other Ambulatory Visit: Payer: Self-pay

## 2019-06-30 ENCOUNTER — Telehealth (INDEPENDENT_AMBULATORY_CARE_PROVIDER_SITE_OTHER): Payer: 59 | Admitting: Family Medicine

## 2019-06-30 DIAGNOSIS — F419 Anxiety disorder, unspecified: Secondary | ICD-10-CM

## 2019-06-30 DIAGNOSIS — Z309 Encounter for contraceptive management, unspecified: Secondary | ICD-10-CM

## 2019-06-30 DIAGNOSIS — K3184 Gastroparesis: Secondary | ICD-10-CM

## 2019-06-30 DIAGNOSIS — E1043 Type 1 diabetes mellitus with diabetic autonomic (poly)neuropathy: Secondary | ICD-10-CM | POA: Diagnosis not present

## 2019-06-30 DIAGNOSIS — R1115 Cyclical vomiting syndrome unrelated to migraine: Secondary | ICD-10-CM

## 2019-06-30 MED ORDER — NORETHINDRONE 0.35 MG PO TABS: 1.0000 | ORAL_TABLET | Freq: Every day | ORAL | 11 refills | Status: DC

## 2019-06-30 MED ORDER — LORAZEPAM 0.5 MG PO TABS: 0.5000 mg | ORAL_TABLET | Freq: Two times a day (BID) | ORAL | 5 refills | Status: DC | PRN

## 2019-06-30 NOTE — Progress Notes (Signed)
   [name] is a 29 y.o. female who presents today for a virtual office visit.  Assessment/Plan:  Chronic Problems Addressed Today: Cyclical vomiting syndrome Please follow-up with GI.  She has Phenergan to use as needed.  Will refill Ativan today as this is worked well in the past to help control her nausea.  Diabetic gastroparesis associated with type 1 diabetes mellitus (HCC) Likely contributed to recurrent cyclic vomiting syndrome.  Also likely has some cannabinoid use playing a role as well.  We will continue management per GI.  Anxiety Stable.  Database with no red flags.  Will refill Ativan 0.5 mg twice daily.  Contraception management She has not been able to come into our office to start Depo-Provera.  Will start progestin only pill as she thinks that her vomiting gets much worse just prior to her period.  She will check in with me in a couple of weeks.     Subjective:  HPI:  Patient here for ED follow-up.  Since our last office visit about 6 months ago she has had numerous ED visits and hospitalizations for diabetic gastroparesis with intractable nausea and vomiting.  She was last seen in the ED 5 days ago.  She still has some ongoing nausea that is at her baseline.  She does note that symptoms seem to be worsening just prior to starting her menstrual cycle.  We have discussed starting Depo-Provera in the past however she has not had a chance to come to our office to get this done.  She will be following up with GI in a month.  She typically takes Phenergan which helps.  She is on Ativan 0.5 mg twice daily which also helps.       Objective/Observations  Physical Exam: Gen: NAD, resting comfortably Pulm: Normal work of breathing Neuro: Grossly normal, moves all extremities Psych: Normal affect and thought content  Virtual Visit via Video   I connected with [name] on 06/30/19 at 11:00 AM EDT by a video enabled telemedicine application and verified that I am  speaking with the correct person using two identifiers. The limitations of evaluation and management by telemedicine and the availability of in person appointments were discussed. The patient expressed understanding and agreed to proceed.   Patient location: Home Provider location: [place] [employer] Persons participating in the virtual visit: Myself and Patient     Katina Degree. Jimmey Ralph, MD 06/30/2019 11:28 AM

## 2019-06-30 NOTE — Assessment & Plan Note (Signed)
Please follow-up with GI.  She has Phenergan to use as needed.  Will refill Ativan today as this is worked well in the past to help control her nausea.

## 2019-06-30 NOTE — Assessment & Plan Note (Signed)
Stable.  Database with no red flags.  Will refill Ativan 0.5 mg twice daily.

## 2019-06-30 NOTE — Assessment & Plan Note (Signed)
Likely contributed to recurrent cyclic vomiting syndrome.  Also likely has some cannabinoid use playing a role as well.  We will continue management per GI.

## 2019-06-30 NOTE — Assessment & Plan Note (Signed)
She has not been able to come into our office to start Depo-Provera.  Will start progestin only pill as she thinks that her vomiting gets much worse just prior to her period.  She will check in with me in a couple of weeks.

## 2019-07-02 ENCOUNTER — Encounter (HOSPITAL_COMMUNITY): Payer: Self-pay

## 2019-07-02 ENCOUNTER — Other Ambulatory Visit: Payer: Self-pay

## 2019-07-02 ENCOUNTER — Emergency Department (HOSPITAL_COMMUNITY)
Admission: EM | Admit: 2019-07-02 | Discharge: 2019-07-02 | Disposition: A | Discharge status: Home / Self Care | Payer: 59 | Attending: Emergency Medicine | Admitting: Emergency Medicine

## 2019-07-02 ENCOUNTER — Emergency Department (HOSPITAL_COMMUNITY)
Admission: EM | Admit: 2019-07-02 | Discharge: 2019-07-03 | Disposition: A | Discharge status: Home / Self Care | Payer: 59 | Source: Home / Self Care | Attending: Emergency Medicine | Admitting: Emergency Medicine

## 2019-07-02 DIAGNOSIS — Z8616 Personal history of COVID-19: Secondary | ICD-10-CM | POA: Insufficient documentation

## 2019-07-02 DIAGNOSIS — I10 Essential (primary) hypertension: Secondary | ICD-10-CM | POA: Insufficient documentation

## 2019-07-02 DIAGNOSIS — Z794 Long term (current) use of insulin: Secondary | ICD-10-CM | POA: Diagnosis not present

## 2019-07-02 DIAGNOSIS — R197 Diarrhea, unspecified: Secondary | ICD-10-CM | POA: Diagnosis not present

## 2019-07-02 DIAGNOSIS — F1210 Cannabis abuse, uncomplicated: Secondary | ICD-10-CM | POA: Diagnosis not present

## 2019-07-02 DIAGNOSIS — Z79899 Other long term (current) drug therapy: Secondary | ICD-10-CM | POA: Insufficient documentation

## 2019-07-02 DIAGNOSIS — E876 Hypokalemia: Secondary | ICD-10-CM | POA: Insufficient documentation

## 2019-07-02 DIAGNOSIS — Z9049 Acquired absence of other specified parts of digestive tract: Secondary | ICD-10-CM | POA: Insufficient documentation

## 2019-07-02 DIAGNOSIS — E109 Type 1 diabetes mellitus without complications: Secondary | ICD-10-CM | POA: Insufficient documentation

## 2019-07-02 DIAGNOSIS — R112 Nausea with vomiting, unspecified: Secondary | ICD-10-CM | POA: Diagnosis not present

## 2019-07-02 DIAGNOSIS — M549 Dorsalgia, unspecified: Secondary | ICD-10-CM | POA: Diagnosis present

## 2019-07-02 LAB — CBC
HCT: 28 % — ABNORMAL LOW (ref 36.0–46.0)
Hemoglobin: 8.3 g/dL — ABNORMAL LOW (ref 12.0–15.0)
MCH: 25.9 pg — ABNORMAL LOW (ref 26.0–34.0)
MCHC: 29.6 g/dL — ABNORMAL LOW (ref 30.0–36.0)
MCV: 87.5 fL (ref 80.0–100.0)
Platelets: 389 10*3/uL (ref 150–400)
RBC: 3.2 MIL/uL — ABNORMAL LOW (ref 3.87–5.11)
RDW: 12.3 % (ref 11.5–15.5)
WBC: 6.7 10*3/uL (ref 4.0–10.5)
nRBC: 0 % (ref 0.0–0.2)

## 2019-07-02 LAB — COMPREHENSIVE METABOLIC PANEL
ALT: 14 U/L (ref 0–44)
AST: 15 U/L (ref 15–41)
Albumin: 2.3 g/dL — ABNORMAL LOW (ref 3.5–5.0)
Alkaline Phosphatase: 90 U/L (ref 38–126)
Anion gap: 17 — ABNORMAL HIGH (ref 5–15)
BUN: 22 mg/dL — ABNORMAL HIGH (ref 6–20)
CO2: 29 mmol/L (ref 22–32)
Calcium: 9 mg/dL (ref 8.9–10.3)
Chloride: 95 mmol/L — ABNORMAL LOW (ref 98–111)
Creatinine, Ser: 1.44 mg/dL — ABNORMAL HIGH (ref 0.44–1.00)
GFR calc Af Amer: 57 mL/min — ABNORMAL LOW (ref >60)
GFR calc non Af Amer: 49 mL/min — ABNORMAL LOW (ref >60)
Glucose, Bld: 202 mg/dL — ABNORMAL HIGH (ref 70–99)
Potassium: 3.4 mmol/L — ABNORMAL LOW (ref 3.5–5.1)
Sodium: 141 mmol/L (ref 135–145)
Total Bilirubin: 0.7 mg/dL (ref 0.3–1.2)
Total Protein: 8 g/dL (ref 6.5–8.1)

## 2019-07-02 LAB — URINALYSIS, ROUTINE W REFLEX MICROSCOPIC
Bilirubin Urine: NEGATIVE
Glucose, UA: 50 mg/dL — AB
Ketones, ur: 5 mg/dL — AB
Leukocytes,Ua: NEGATIVE
Nitrite: NEGATIVE
Protein, ur: >=300 mg/dL — AB
Specific Gravity, Urine: 1.023 (ref 1.005–1.030)
pH: 5 (ref 5.0–8.0)

## 2019-07-02 LAB — TROPONIN I (HIGH SENSITIVITY)
Troponin I (High Sensitivity): 4 ng/L (ref <18)
Troponin I (High Sensitivity): 6 ng/L (ref <18)

## 2019-07-02 LAB — I-STAT BETA HCG BLOOD, ED (MC, WL, AP ONLY): I-stat hCG, quantitative: <5 m[IU]/mL (ref <5)

## 2019-07-02 LAB — LIPASE, BLOOD: Lipase: 16 U/L (ref 11–51)

## 2019-07-02 MED ORDER — SODIUM CHLORIDE 0.9% FLUSH
3.0000 mL | Freq: Once | INTRAVENOUS | Status: AC
  Administered 2019-07-02: 3 mL via INTRAVENOUS

## 2019-07-02 MED ORDER — SODIUM CHLORIDE 0.9 % IV BOLUS
1000.0000 mL | Freq: Once | INTRAVENOUS | Status: AC
  Administered 2019-07-03: 1000 mL via INTRAVENOUS

## 2019-07-02 MED ORDER — METOCLOPRAMIDE HCL 5 MG/ML IJ SOLN
10.0000 mg | Freq: Once | INTRAMUSCULAR | Status: AC
  Administered 2019-07-03: 10 mg via INTRAVENOUS
  Filled 2019-07-02: qty 2

## 2019-07-02 MED ORDER — SODIUM CHLORIDE 0.9 % IV BOLUS
1000.0000 mL | Freq: Once | INTRAVENOUS | Status: AC
  Administered 2019-07-02: 1000 mL via INTRAVENOUS

## 2019-07-02 MED ORDER — PROMETHAZINE HCL 25 MG/ML IJ SOLN: 12.5000 mg | Freq: Once | INTRAMUSCULAR | Status: DC

## 2019-07-02 MED ORDER — PROMETHAZINE HCL 25 MG/ML IJ SOLN
25.0000 mg | Freq: Once | INTRAMUSCULAR | Status: AC
  Administered 2019-07-02: 25 mg via INTRAVENOUS
  Filled 2019-07-02: qty 1

## 2019-07-02 MED ORDER — LIDOCAINE 5 % EX PTCH
1.0000 | MEDICATED_PATCH | Freq: Once | CUTANEOUS | Status: DC
  Administered 2019-07-02: 1 via TRANSDERMAL
  Filled 2019-07-02: qty 1

## 2019-07-02 MED ORDER — POTASSIUM CHLORIDE ER 10 MEQ PO TBCR: 10.0000 meq | EXTENDED_RELEASE_TABLET | Freq: Every day | ORAL | 0 refills | Status: DC

## 2019-07-02 NOTE — ED Triage Notes (Signed)
Arrived POV from home. Patient recently discharged from [facility] today. Patient reports abdominal pain nausea and vomiting.

## 2019-07-02 NOTE — ED Provider Notes (Signed)
Medical screening examination/treatment/procedure(s) were conducted as a shared visit with non-physician practitioner(s) and myself.  I personally evaluated the patient during the encounter.  EKG Interpretation  Date/Time:  Saturday July 02 2019 08:27:39 EDT Ventricular Rate:  103 PR Interval:    QRS Duration: 86 QT Interval:  338 QTC Calculation: 443 R Axis:   46 Text Interpretation: Sinus tachycardia Abnormal Q suggests anterior infarct Nonspecific T abnormalities, anterior leads similar to previous except inferior q waves more pronounced Confirmed by Charlesetta Shanks [phone]) on 07/02/2019 9:14:27 AM  Angiocath insertion Performed by: Charlesetta Shanks  Consent: Verbal consent obtained. Risks and benefits: risks, benefits and alternatives were discussed Time out: Immediately prior to procedure a "time out" was called to verify the correct patient, procedure, equipment, support staff and site/side marked as required.  Preparation: Patient was prepped and draped in the usual sterile fashion.  Vein Location: Right basilic  Yes ultrasound Guided  Gauge: 20 long  Normal blood return and flush without difficulty Patient tolerance: Patient tolerated the procedure well with no immediate complications.  Patient with multiple medical comorbidities.  Presents with recurrent nausea and vomiting.  This has been a chronic problem.  Home medications are not working.  Patient is alert.  No somnolence.  She is thin and deconditioned.  No respiratory distress.  Mental status clear.  Ambulatory without difficulty.  Movements coordinated and purposeful.  Skin warm and dry.  Agree with plan of management.     Charlesetta Shanks, MD 07/02/19 1017

## 2019-07-02 NOTE — ED Triage Notes (Signed)
Pt arrives to ED A&ox 4 on arrival; pt is tearful at triage and c/o severe back pain with Abdominal pain, n/v/d; pt states she has vomited 10 plus times; pt states she just received 1st vaccine dose on yesterday; pt c/o pain at [facility]

## 2019-07-02 NOTE — Discharge Instructions (Signed)
Follow up with your PCP regarding your ED visit today. Follow up with your gastroenterologist as well. Pick up potassium supplements and take as prescribed. You will need to have your potassium level rechecked in 1-2 weeks.

## 2019-07-02 NOTE — ED Provider Notes (Signed)
[facility] EMERGENCY DEPARTMENT Provider Note   CSN: [id number] Arrival date & time: 07/02/19  2536     History Chief Complaint  Patient presents with  . Back Pain  . Nausea  . Emesis  . Abdominal Pain    CHERINE DRUMGOOLE is a 29 y.o. female with PMHx HTN, GERD, Fibromyalgia, Type 1 Diabetes, Cyclic Vomiting syndrome, Cannibinoid Hyperemesis Syndrome who presents to the ED today complaining of gradual onset, constant, achy, diffuse abdominal pain and diffuse back pain that started yesterday.  Patient also complains of nausea and nonbloody nonbilious emesis.  She states that all of her symptoms feel typical for her.  He states she has taken both oral and suppository Phenergan as well as her home Flexeril Bentyl however has been unable to keep anything down due to the vomiting prompting her to come to the ED today.  Patient reports she received her first dose of her Pfizer Covid vaccine 2 days ago and thinks this may have triggered her symptoms.  Chart review this is patient's 24th visit in the last 6 months.   The history is provided by the patient and medical records.       Past Medical History:  Diagnosis Date  . Allergy   . Anemia   . Anxiety   . Blood transfusion without reported diagnosis    Dec 2018  . Cataract    right eye  . COVID-19   . Depression   . Diabetes type 1, uncontrolled ([place]) 11/14/2011   Since age 89  . Fibromyalgia   . Gastroparesis   . GERD (gastroesophageal reflux disease)   . Hypertension   . Infection    UTI April 2016    Patient Active Problem List   Diagnosis Date Noted  . Nausea & vomiting 04/30/2019  . Hypertensive urgency 04/30/2019  . Cannabinoid hyperemesis syndrome 04/30/2019  . Contraception management 08/06/2018  . Cyclical vomiting syndrome 06/07/2018  . Diabetic gastroparesis associated with type 1 diabetes mellitus ([place]) 06/04/2018  . Anxiety 05/31/2018  . Peripheral edema 05/31/2018  . Normocytic anemia  07/10/2017  . GERD (gastroesophageal reflux disease) 06/23/2017  . Intractable nausea and vomiting 04/11/2017  . MDD (major depressive disorder), recurrent episode, mild ([place]) 02/28/2017  . Diabetes mellitus type 1 ([place]) 04/28/2013  . Protein-calorie malnutrition, severe ([place]) 04/28/2013  . Hypertension associated with diabetes ([place]) 11/14/2011  . Chronic abdominal pain 09/18/2011    Past Surgical History:  Procedure Laterality Date  . ANKLE SURGERY    . CHOLECYSTECTOMY  11/15/2011   Procedure: LAPAROSCOPIC CHOLECYSTECTOMY WITH INTRAOPERATIVE CHOLANGIOGRAM;  Surgeon: [name], MD;  Location: [facility] ORS;  Service: General;  Laterality: N/A;  . COLONOSCOPY    . COLONOSCOPY WITH PROPOFOL N/A 06/27/2017   Procedure: COLONOSCOPY WITH PROPOFOL;  Surgeon: Milus Banister, MD;  Location: [facility] ENDOSCOPY;  Service: Endoscopy;  Laterality: N/A;  . ESOPHAGOGASTRODUODENOSCOPY  12/03/2011   Procedure: ESOPHAGOGASTRODUODENOSCOPY (EGD);  Surgeon: Beryle Beams, MD;  Location: [facility];  Service: Endoscopy;  Laterality: N/A;  . FLEXIBLE SIGMOIDOSCOPY N/A 03/10/2017   Procedure: FLEXIBLE SIGMOIDOSCOPY;  Surgeon: Carol Ada, MD;  Location: [facility];  Service: Endoscopy;  Laterality: N/A;  . INCISION AND DRAINAGE PERIRECTAL ABSCESS N/A 03/01/2017   Procedure: IRRIGATION AND DEBRIDEMENT PERIRECTAL ABSCESS;  Surgeon: Alphonsa Overall, MD;  Location: [facility];  Service: General;  Laterality: N/A;  . IRRIGATION AND DEBRIDEMENT BUTTOCKS N/A 03/23/2017   Procedure: IRRIGATION AND DEBRIDEMENT BUTTOCKS, SETON PLACEMENT;  Surgeon: [name], MD;  Location: [facility];  Service: General;  Laterality: N/A;  . LAPAROSCOPY  11/23/2011   Procedure: LAPAROSCOPY DIAGNOSTIC;  Surgeon: Edward Jolly, MD;  Location: [facility];  Service: General;  Laterality: N/A;  . SIGMOIDOSCOPY    . UPPER GASTROINTESTINAL ENDOSCOPY    . WISDOM TOOTH EXTRACTION       OB History    Gravida  1   Para  1   Term      Preterm    1   AB      Living  1     SAB      TAB      Ectopic      Multiple  0   Live Births  1           Family History  Problem Relation Age of Onset  . Diabetes Mother   . Hypertension Father   . Colon cancer Paternal Grandmother        pt thinks PGM was dx in her 73's  . Diabetes Paternal Grandmother   . Diabetes Maternal Grandmother   . Diabetes Maternal Grandfather   . Diabetes Paternal Grandfather   . Diabetes Other   . Esophageal cancer Neg Hx   . Liver cancer Neg Hx   . Pancreatic cancer Neg Hx   . Stomach cancer Neg Hx   . Rectal cancer Neg Hx     Social History   Tobacco Use  . Smoking status: Never Smoker  . Smokeless tobacco: Never Used  Substance Use Topics  . Alcohol use: No    Alcohol/week: 0.0 standard drinks  . Drug use: Yes    Types: Marijuana    Home Medications Prior to Admission medications   Medication Sig Start Date End Date Taking? Authorizing Provider  Continuous Blood Gluc Receiver ([place]) [place] See admin instructions. USE AS DIRECTED TO MONITOR GLUCOSE LEVELS 10/29/18   [provider]  Continuous Blood Gluc Sensor (DEXCOM G6 SENSOR) MISC 1 each by Does not apply route See admin instructions. every 10 days. 12/31/18   Renato Shin, MD  Continuous Blood Gluc Transmit (DEXCOM G6 TRANSMITTER) MISC See admin instructions. USE AS DIRECTED TO MONITOR GLUCOSE LEVELS 10/29/18   [provider]  cyclobenzaprine (FLEXERIL) 10 MG tablet Take 1 tablet (10 mg total) by mouth 3 (three) times daily as needed for muscle spasms. 06/07/19   [name], MD  dicyclomine (BENTYL) 20 MG tablet Take 1 tablet (20 mg total) by mouth 3 (three) times daily as needed for spasms. 05/01/19   [name], MD  furosemide (LASIX) 40 MG tablet TAKE 1 TABLET BY MOUTH  DAILY Patient taking differently: Take 40 mg by mouth daily.  11/11/18   Armbruster, Carlota Raspberry, MD  hydrALAZINE (APRESOLINE) 50 MG tablet Take 1 tablet (50 mg total) by  mouth every 6 (six) hours. Patient taking differently: Take 50 mg by mouth 2 (two) times daily.  10/23/18   Swayze, Ava, DO  Insulin Glargine (LANTUS SOLOSTAR) 100 UNIT/ML Solostar Pen Inject 10 Units into the skin at bedtime. And pen needles 4/day 12/31/18   Renato Shin, MD  insulin lispro (HUMALOG KWIKPEN) 100 UNIT/ML KwikPen Inject 0.06 mLs (6 Units total) into the skin 3 (three) times daily. Patient taking differently: Inject 1-9 Units into the skin 3 (three) times daily. Per sliding scale 12/31/18   Renato Shin, MD  loperamide (IMODIUM) 2 MG capsule Take 6 mg by mouth as needed for diarrhea or loose stools.  [provider]  LORazepam (ATIVAN) 0.5 MG tablet Take 1 tablet (0.5 mg total) by mouth 2 (two) times daily as needed for anxiety. 06/30/19   [name], MD  metoCLOPramide (REGLAN) 10 MG tablet Take 1 tablet (10 mg total) by mouth every 8 (eight) hours as needed for nausea (nausea/headache). 06/23/19   Palumbo, April, MD  metoprolol tartrate (LOPRESSOR) 25 MG tablet Take 1 tablet (25 mg total) by mouth 2 (two) times daily. 05/22/19 06/23/19  Arrien, Jimmy Picket, MD  mirtazapine (REMERON) 30 MG tablet TAKE 1 TABLET BY MOUTH AT  BEDTIME Patient taking differently: Take 30 mg by mouth at bedtime.  04/19/19   [name], MD  norethindrone (ORTHO MICRONOR) 0.35 MG tablet Take 1 tablet (0.35 mg total) by mouth daily. 06/30/19   [name], MD  OVER THE COUNTER MEDICATION Take 2 each by mouth daily. Flo Chewable Gummies for period    [provider]  pantoprazole (PROTONIX) 40 MG tablet TAKE 1 TABLET BY MOUTH  DAILY Patient taking differently: Take 40 mg by mouth daily.  10/22/18   Armbruster, Carlota Raspberry, MD  potassium chloride (KLOR-CON) 10 MEQ tablet Take 1 tablet (10 mEq total) by mouth daily for 4 days. 07/02/19 07/06/19  Eustaquio Maize, PA-C  promethazine (PHENERGAN) 12.5 MG tablet TAKE 1 TABLET BY MOUTH EVERY 8 HOURS AS NEEDED FOR NAUSEA 06/27/19   Armbruster, Carlota Raspberry, MD  promethazine (PHENERGAN) 25 MG suppository Place 1 suppository (25 mg total) rectally every 6 (six) hours as needed for nausea or vomiting. 05/28/19   Rancour, Annie Main, MD  promethazine (PHENERGAN) 25 MG suppository Place 1 suppository (25 mg total) rectally every 8 (eight) hours as needed for nausea or vomiting. Please schedule and keep an OV for ANY further refills.  DO NOT take oral phenergan and suppositories at the same time. 06/27/19   Armbruster, Carlota Raspberry, MD  promethazine (PHENERGAN) 25 MG tablet Take 1 tablet (25 mg total) by mouth every 8 (eight) hours as needed for nausea or vomiting. Patient not taking: Reported on 06/30/2019 06/23/19   Sherwood Gambler, MD  sucralfate (CARAFATE) 1 GM/10ML suspension Take 10 mLs (1 g total) by mouth 4 (four) times daily -  with meals and at bedtime. 06/23/19   Palumbo, April, MD  potassium chloride SA (KLOR-CON) 20 MEQ tablet Take 1 tablet (20 mEq total) by mouth daily. Patient not taking: Reported on 03/31/2019 03/14/19 04/23/19  Drenda Freeze, MD    Allergies    Other and Lactose intolerance (gi)  Review of Systems   Review of Systems  Constitutional: Negative for chills and fever.  Respiratory: Negative for shortness of breath.   Cardiovascular: Negative for chest pain.  Gastrointestinal: Positive for abdominal pain, nausea and vomiting. Negative for constipation.  Musculoskeletal: Positive for back pain.  All other systems reviewed and are negative.   Physical Exam Updated Vital Signs BP (!) 147/97 (BP Location: Right Arm)   Pulse (!) 111   Temp 97.7 F (36.5 C) (Oral)   Resp 16   Ht 5' 7"  (1.702 m)   Wt 54.4 kg   LMP 05/16/2019 (Approximate)   SpO2 100%   BMI 18.79 kg/m   Physical Exam Vitals and nursing note reviewed.  Constitutional:      Appearance: She is not ill-appearing or diaphoretic.     Comments: Cachectic, tearful  HENT:     Head: Normocephalic and atraumatic.  Eyes:     Conjunctiva/sclera: Conjunctivae  normal.  Cardiovascular:  Rate and Rhythm: Regular rhythm. Tachycardia present.  Pulmonary:     Effort: Pulmonary effort is normal.     Breath sounds: Normal breath sounds. No wheezing, rhonchi or rales.  Abdominal:     Palpations: Abdomen is soft.     Tenderness: There is generalized abdominal tenderness. There is no right CVA tenderness, left CVA tenderness, guarding or rebound.  Musculoskeletal:     Cervical back: Neck supple.  Skin:    General: Skin is warm and dry.  Neurological:     Mental Status: She is alert.     ED Results / Procedures / Treatments   Labs (all labs ordered are listed, but only abnormal results are displayed) Labs Reviewed  COMPREHENSIVE METABOLIC PANEL - Abnormal; Notable for the following components:      Result Value   Potassium 3.4 (*)    Chloride 95 (*)    Glucose, Bld 202 (*)    BUN 22 (*)    Creatinine, Ser 1.44 (*)    Albumin 2.3 (*)    GFR calc non Af Amer 49 (*)    GFR calc Af Amer 57 (*)    Anion gap 17 (*)    All other components within normal limits  CBC - Abnormal; Notable for the following components:   RBC 3.20 (*)    Hemoglobin 8.3 (*)    HCT 28.0 (*)    MCH 25.9 (*)    MCHC 29.6 (*)    All other components within normal limits  URINALYSIS, ROUTINE W REFLEX MICROSCOPIC - Abnormal; Notable for the following components:   APPearance HAZY (*)    Glucose, UA 50 (*)    Hgb urine dipstick SMALL (*)    Ketones, ur 5 (*)    Protein, ur >=300 (*)    Bacteria, UA RARE (*)    Non Squamous Epithelial 0-5 (*)    All other components within normal limits  LIPASE, BLOOD  I-STAT BETA HCG BLOOD, ED ([facility], [facility], [facility] ONLY)  TROPONIN I (HIGH SENSITIVITY)  TROPONIN I (HIGH SENSITIVITY)    EKG EKG Interpretation  Date/Time:  Saturday July 02 2019 08:27:39 EDT Ventricular Rate:  103 PR Interval:    QRS Duration: 86 QT Interval:  338 QTC Calculation: 443 R Axis:   46 Text Interpretation: Sinus tachycardia Abnormal Q suggests anterior  infarct Nonspecific T abnormalities, anterior leads similar to previous except inferior q waves more pronounced Confirmed by Charlesetta Shanks [phone]) on 07/02/2019 9:14:27 AM   Radiology No results found.  Procedures Procedures (including critical care time)  Medications Ordered in ED Medications  lidocaine (LIDODERM) 5 % 1 patch (1 patch Transdermal Patch Applied 07/02/19 1206)  sodium chloride flush (NS) 0.9 % injection 3 mL (3 mLs Intravenous Given 07/02/19 0903)  sodium chloride 0.9 % bolus 1,000 mL (0 mLs Intravenous Stopped 07/02/19 1208)  promethazine (PHENERGAN) injection 25 mg (25 mg Intravenous Given 07/02/19 1016)    ED Course  I have reviewed the triage vital signs and the nursing notes.  Pertinent labs & imaging results that were available during my care of the patient were reviewed by me and considered in my medical decision making (see chart for details).  Clinical Course as of Jul 02 1318  Sat Jul 02, 2019  1046 Troponin I (High Sensitivity): 4 [MV]    Clinical Course User Index [MV] Eustaquio Maize, PA-C   MDM Rules/Calculators/A&P  29 year old female with a history of cyclic vomiting and cannabis hyperemesis syndrome who presents to the ED today complaining of nausea, vomiting, abdominal pain, back pain which is typical for her.  This is her 24th visit in the past 6 months.  Most recently seen in the ED on 3/25.  Left prior to being seen on 3/26.  Had a telemedicine visit with her PCP 2 days ago and was represcribed Ativan.  Arrival to the ED patient is afebrile, tachycardic in the 110s, nontachypneic.  She is tearful on exam.  She has some diffuse abdominal tenderness on upper no peritoneal signs.  Patient states her back and abdominal pain is typical for her cyclic vomiting syndrome.  Lab work was obtained prior to being seen.  Will initiate fluids and antiemetic medication.  Will obtain EKG as it appears patient frequently gets droperidol while in the ED  for symptomatic relief.  Will check for QTC prolongation.   CBC without leukocytosis.  Hemoglobin 8.3 which appears to be somewhat decreased from baseline.   Hemoglobin  Date Value Ref Range Status  07/02/2019 8.3 (L) 12.0 - 15.0 g/dL Final  06/23/2019 9.1 (L) 12.0 - 15.0 g/dL Final  06/21/2019 8.6 (L) 12.0 - 15.0 g/dL Final  06/01/2019 9.5 (L) 12.0 - 15.0 g/dL Final   CMP with creatinine 1.44; appears that pt's kidney function is gradually declining. Glucose 202, bicarb 29, gap of 17 however suspect this is likely from dehydration. Potassium 3.4.   Lab Results  Component Value Date   CREATININE 1.44 (H) 07/02/2019   CREATININE 1.27 (H) 06/23/2019   CREATININE 0.89 06/21/2019   Lipase 16.  Beta hcg negative.   QTC without prolongation. EKG does show some q wave changes; troponin added on by attending physician Dr. Johnney Killian. Pt without complaint of chest pain or SOB. Will start with IVF and phenergan and reevalate. It appears this has worked in the past and other times has not.   Initial troponin of 4 and repeat of 6.  Pt reports improvement in symptoms; has been fluid challenged successfully with full cup of water and gingerale. Feel she is stable for discharge home at this time. Advised to follow up with PCP for further eval. Will discharge home with potassium supplements given K 3.4 today. Pt advised to continue taking home meds as prescribed. Pt in agreement with plan and stable for discharge.   This note was prepared using Dragon voice recognition software and may include unintentional dictation errors due to the inherent limitations of voice recognition software.  Final Clinical Impression(s) / ED Diagnoses Final diagnoses:  Nausea vomiting and diarrhea  Hypokalemia    Rx / DC Orders ED Discharge Orders         Ordered    potassium chloride (KLOR-CON) 10 MEQ tablet  Daily     07/02/19 1319           Discharge Instructions     Follow up with your PCP regarding your  ED visit today. Follow up with your gastroenterologist as well. Pick up potassium supplements and take as prescribed. You will need to have your potassium level rechecked in 1-2 weeks.        Eustaquio Maize, PA-C 07/02/19 [place], MD 07/03/19 [phone]

## 2019-07-03 LAB — CBC WITH DIFFERENTIAL/PLATELET
Abs Immature Granulocytes: 0.04 10*3/uL (ref 0.00–0.07)
Basophils Absolute: 0.1 10*3/uL (ref 0.0–0.1)
Basophils Relative: 1 %
Eosinophils Absolute: 0 10*3/uL (ref 0.0–0.5)
Eosinophils Relative: 0 %
HCT: 28 % — ABNORMAL LOW (ref 36.0–46.0)
Hemoglobin: 8.5 g/dL — ABNORMAL LOW (ref 12.0–15.0)
Immature Granulocytes: 1 %
Lymphocytes Relative: 11 %
Lymphs Abs: 0.8 10*3/uL (ref 0.7–4.0)
MCH: 26.4 pg (ref 26.0–34.0)
MCHC: 30.4 g/dL (ref 30.0–36.0)
MCV: 87 fL (ref 80.0–100.0)
Monocytes Absolute: 0.3 10*3/uL (ref 0.1–1.0)
Monocytes Relative: 4 %
Neutro Abs: 6.6 10*3/uL (ref 1.7–7.7)
Neutrophils Relative %: 83 %
Platelets: 400 10*3/uL (ref 150–400)
RBC: 3.22 MIL/uL — ABNORMAL LOW (ref 3.87–5.11)
RDW: 12.4 % (ref 11.5–15.5)
WBC: 7.8 10*3/uL (ref 4.0–10.5)
nRBC: 0 % (ref 0.0–0.2)

## 2019-07-03 LAB — CBG MONITORING, ED: Glucose-Capillary: 226 mg/dL — ABNORMAL HIGH (ref 70–99)

## 2019-07-03 LAB — COMPREHENSIVE METABOLIC PANEL
ALT: 12 U/L (ref 0–44)
AST: 15 U/L (ref 15–41)
Albumin: 2.7 g/dL — ABNORMAL LOW (ref 3.5–5.0)
Alkaline Phosphatase: 95 U/L (ref 38–126)
Anion gap: 14 (ref 5–15)
BUN: 26 mg/dL — ABNORMAL HIGH (ref 6–20)
CO2: 29 mmol/L (ref 22–32)
Calcium: 8.9 mg/dL (ref 8.9–10.3)
Chloride: 94 mmol/L — ABNORMAL LOW (ref 98–111)
Creatinine, Ser: 1.31 mg/dL — ABNORMAL HIGH (ref 0.44–1.00)
GFR calc Af Amer: >60 mL/min (ref >60)
GFR calc non Af Amer: 55 mL/min — ABNORMAL LOW (ref >60)
Glucose, Bld: 248 mg/dL — ABNORMAL HIGH (ref 70–99)
Potassium: 3.3 mmol/L — ABNORMAL LOW (ref 3.5–5.1)
Sodium: 137 mmol/L (ref 135–145)
Total Bilirubin: 0.6 mg/dL (ref 0.3–1.2)
Total Protein: 8.7 g/dL — ABNORMAL HIGH (ref 6.5–8.1)

## 2019-07-03 LAB — URINALYSIS, ROUTINE W REFLEX MICROSCOPIC
Bacteria, UA: NONE SEEN
Bilirubin Urine: NEGATIVE
Glucose, UA: 50 mg/dL — AB
Ketones, ur: 5 mg/dL — AB
Leukocytes,Ua: NEGATIVE
Nitrite: NEGATIVE
Protein, ur: >=300 mg/dL — AB
Specific Gravity, Urine: 1.018 (ref 1.005–1.030)
pH: 5 (ref 5.0–8.0)

## 2019-07-03 LAB — LIPASE, BLOOD: Lipase: 17 U/L (ref 11–51)

## 2019-07-03 MED ORDER — LIDOCAINE VISCOUS HCL 2 % MT SOLN
15.0000 mL | Freq: Once | OROMUCOSAL | Status: AC
  Administered 2019-07-03: 15 mL via ORAL
  Filled 2019-07-03: qty 15

## 2019-07-03 MED ORDER — ALUM & MAG HYDROXIDE-SIMETH 200-200-20 MG/5ML PO SUSP
30.0000 mL | Freq: Once | ORAL | Status: AC
  Administered 2019-07-03: 30 mL via ORAL
  Filled 2019-07-03: qty 30

## 2019-07-03 NOTE — ED Notes (Signed)
Pt sitting up on the bed awake. NAD noted. Pt denies any needs. Will continue to monitor.

## 2019-07-03 NOTE — ED Notes (Signed)
Pt up walking around the room. Denies any needs. Water given. Will continue to monitor pt.

## 2019-07-03 NOTE — ED Provider Notes (Signed)
[facility] DEPT Provider Note   CSN: [id number] Arrival date & time: 07/02/19  2243     History Chief Complaint  Patient presents with  . Abdominal Pain  . Emesis    MAKEISHA JENTSCH is a 29 y.o. female presenting for evaluation of nausea, vomiting, abdominal pain.  Patient states she was feeling better when she left the [facility] ED earlier today.  She took a nap, but when she woke up she continued to have nausea and vomiting.  Patient states due to this, she continues to have abdominal pain and chest pain.  She used her Phenergan suppository without improvement of symptoms.  She has not taken anything else for her symptoms.  She denies fevers, chills, shortness of breath, cough, urinary symptoms, abnormal bowel movements.  She states she has an appointment with her GI doctor next month.  Last marijuana use was yesterday.  Additional history obtained per chart review.  Patient with a history of anemia, anxiety, depression, diabetes, fibromyalgia, gastroparesis, GERD, hypertension, hyperemesis cannabinoid syndrome.  I reviewed patient's ER visit from earlier today, including notes and labs. This is patient's 25th visit in the ED in the past 6 months.  Majority of visits are due to nausea, vomiting, abdominal pain.  HPI     Past Medical History:  Diagnosis Date  . Allergy   . Anemia   . Anxiety   . Blood transfusion without reported diagnosis    Dec 2018  . Cataract    right eye  . COVID-19   . Depression   . Diabetes type 1, uncontrolled ([place]) 11/14/2011   Since age 26  . Fibromyalgia   . Gastroparesis   . GERD (gastroesophageal reflux disease)   . Hypertension   . Infection    UTI April 2016    Patient Active Problem List   Diagnosis Date Noted  . Nausea & vomiting 04/30/2019  . Hypertensive urgency 04/30/2019  . Cannabinoid hyperemesis syndrome 04/30/2019  . Contraception management 08/06/2018  . Cyclical vomiting syndrome 06/07/2018   . Diabetic gastroparesis associated with type 1 diabetes mellitus ([place]) 06/04/2018  . Anxiety 05/31/2018  . Peripheral edema 05/31/2018  . Normocytic anemia 07/10/2017  . GERD (gastroesophageal reflux disease) 06/23/2017  . Intractable nausea and vomiting 04/11/2017  . 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 Diabetes mellitus type 1 ([place]) 04/28/2013  . Protein-calorie malnutrition, severe ([place]) 04/28/2013  . Hypertension associated with diabetes ([place]) 11/14/2011  . Chronic abdominal pain 09/18/2011    Past Surgical History:  Procedure Laterality Date  . ANKLE SURGERY    . CHOLECYSTECTOMY  11/15/2011   Procedure: LAPAROSCOPIC CHOLECYSTECTOMY WITH INTRAOPERATIVE CHOLANGIOGRAM;  Surgeon: [name], MD;  Location: [facility];  Service: General;  Laterality: N/A;  . COLONOSCOPY    . COLONOSCOPY WITH PROPOFOL N/A 06/27/2017   Procedure: COLONOSCOPY WITH PROPOFOL;  Surgeon: Milus Banister, MD;  Location: [facility];  Service: Endoscopy;  Laterality: N/A;  . ESOPHAGOGASTRODUODENOSCOPY  12/03/2011   Procedure: ESOPHAGOGASTRODUODENOSCOPY (EGD);  Surgeon: Beryle Beams, MD;  Location: [facility];  Service: Endoscopy;  Laterality: N/A;  . FLEXIBLE SIGMOIDOSCOPY N/A 03/10/2017   Procedure: FLEXIBLE SIGMOIDOSCOPY;  Surgeon: Carol Ada, MD;  Location: [facility];  Service: Endoscopy;  Laterality: N/A;  . INCISION AND DRAINAGE PERIRECTAL ABSCESS N/A 03/01/2017   Procedure: IRRIGATION AND DEBRIDEMENT PERIRECTAL ABSCESS;  Surgeon: Alphonsa Overall, MD;  Location: [facility];  Service: General;  Laterality: N/A;  . IRRIGATION AND DEBRIDEMENT BUTTOCKS  N/A 03/23/2017   Procedure: IRRIGATION AND DEBRIDEMENT BUTTOCKS, SETON PLACEMENT;  Surgeon: [name], MD;  Location: [facility];  Service: General;  Laterality: N/A;  . LAPAROSCOPY  11/23/2011   Procedure: LAPAROSCOPY DIAGNOSTIC;  Surgeon: Edward Jolly, MD;  Location: [facility];  Service: General;  Laterality:  N/A;  . SIGMOIDOSCOPY    . UPPER GASTROINTESTINAL ENDOSCOPY    . WISDOM TOOTH EXTRACTION       OB History    Gravida  1   Para  1   Term      Preterm  1   AB      Living  1     SAB      TAB      Ectopic      Multiple  0   Live Births  1           Family History  Problem Relation Age of Onset  . Diabetes Mother   . Hypertension Father   . Colon cancer Paternal Grandmother        pt thinks PGM was dx in her 40's  . Diabetes Paternal Grandmother   . Diabetes Maternal Grandmother   . Diabetes Maternal Grandfather   . Diabetes Paternal Grandfather   . Diabetes Other   . Esophageal cancer Neg Hx   . Liver cancer Neg Hx   . Pancreatic cancer Neg Hx   . Stomach cancer Neg Hx   . Rectal cancer Neg Hx     Social History   Tobacco Use  . Smoking status: Never Smoker  . Smokeless tobacco: Never Used  Substance Use Topics  . Alcohol use: No    Alcohol/week: 0.0 standard drinks  . Drug use: Yes    Types: Marijuana    Home Medications Prior to Admission medications   Medication Sig Start Date End Date Taking? Authorizing Provider  Continuous Blood Gluc Receiver ([place]) [place] See admin instructions. USE AS DIRECTED TO MONITOR GLUCOSE LEVELS 10/29/18  Yes [provider]  Continuous Blood Gluc Sensor (DEXCOM G6 SENSOR) MISC 1 each by Does not apply route See admin instructions. every 10 days. 12/31/18  Yes Renato Shin, MD  Continuous Blood Gluc Transmit (DEXCOM G6 TRANSMITTER) MISC See admin instructions. USE AS DIRECTED TO MONITOR GLUCOSE LEVELS 10/29/18  Yes [provider]  cyclobenzaprine (FLEXERIL) 10 MG tablet Take 1 tablet (10 mg total) by mouth 3 (three) times daily as needed for muscle spasms. 06/07/19  Yes [name], MD  dicyclomine (BENTYL) 20 MG tablet Take 1 tablet (20 mg total) by mouth 3 (three) times daily as needed for spasms. 05/01/19  Yes [name], MD  furosemide (LASIX) 40 MG tablet TAKE 1 TABLET  BY MOUTH  DAILY Patient taking differently: Take 40 mg by mouth daily.  11/11/18  Yes Armbruster, Carlota Raspberry, MD  hydrALAZINE (APRESOLINE) 50 MG tablet Take 1 tablet (50 mg total) by mouth every 6 (six) hours. Patient taking differently: Take 50 mg by mouth 2 (two) times daily.  10/23/18  Yes Swayze, Ava, DO  Insulin Glargine (LANTUS SOLOSTAR) 100 UNIT/ML Solostar Pen Inject 10 Units into the skin at bedtime. And pen needles 4/day 12/31/18  Yes Renato Shin, MD  insulin lispro (HUMALOG KWIKPEN) 100 UNIT/ML KwikPen Inject 0.06 mLs (6 Units total) into the skin 3 (three) times daily. Patient taking differently: Inject 1-9 Units into the skin 3 (three) times daily. Per sliding scale 12/31/18  Yes Renato Shin, MD  sucralfate ([place]) 1  GM/10ML suspension Take 10 mLs (1 g total) by mouth 4 (four) times daily -  with meals and at bedtime. 06/23/19  Yes Palumbo, April, MD  loperamide (IMODIUM) 2 MG capsule Take 6 mg by mouth as needed for diarrhea or loose stools.    [provider]  LORazepam (ATIVAN) 0.5 MG tablet Take 1 tablet (0.5 mg total) by mouth 2 (two) times daily as needed for anxiety. 06/30/19   [name], MD  metoCLOPramide (REGLAN) 10 MG tablet Take 1 tablet (10 mg total) by mouth every 8 (eight) hours as needed for nausea (nausea/headache). 06/23/19   Palumbo, April, MD  metoprolol tartrate (LOPRESSOR) 25 MG tablet Take 1 tablet (25 mg total) by mouth 2 (two) times daily. 05/22/19 06/23/19  Arrien, Jimmy Picket, MD  mirtazapine (REMERON) 30 MG tablet TAKE 1 TABLET BY MOUTH AT  BEDTIME Patient taking differently: Take 30 mg by mouth at bedtime.  04/19/19   [name], MD  norethindrone (ORTHO MICRONOR) 0.35 MG tablet Take 1 tablet (0.35 mg total) by mouth daily. 06/30/19   [name], MD  OVER THE COUNTER MEDICATION Take 2 each by mouth daily. Flo Chewable Gummies for period    [provider]  pantoprazole (PROTONIX) 40 MG tablet TAKE 1 TABLET BY MOUTH   DAILY Patient taking differently: Take 40 mg by mouth daily.  10/22/18   Armbruster, Carlota Raspberry, MD  potassium chloride (KLOR-CON) 10 MEQ tablet Take 1 tablet (10 mEq total) by mouth daily for 4 days. 07/02/19 07/06/19  Eustaquio Maize, PA-C  promethazine (PHENERGAN) 12.5 MG tablet TAKE 1 TABLET BY MOUTH EVERY 8 HOURS AS NEEDED FOR NAUSEA 06/27/19   Armbruster, Carlota Raspberry, MD  promethazine (PHENERGAN) 25 MG suppository Place 1 suppository (25 mg total) rectally every 6 (six) hours as needed for nausea or vomiting. 05/28/19   Rancour, Annie Main, MD  promethazine (PHENERGAN) 25 MG suppository Place 1 suppository (25 mg total) rectally every 8 (eight) hours as needed for nausea or vomiting. Please schedule and keep an OV for ANY further refills.  DO NOT take oral phenergan and suppositories at the same time. 06/27/19   Armbruster, Carlota Raspberry, MD  promethazine (PHENERGAN) 25 MG tablet Take 1 tablet (25 mg total) by mouth every 8 (eight) hours as needed for nausea or vomiting. Patient not taking: Reported on 06/30/2019 06/23/19   Sherwood Gambler, MD  potassium chloride SA (KLOR-CON) 20 MEQ tablet Take 1 tablet (20 mEq total) by mouth daily. Patient not taking: Reported on 03/31/2019 03/14/19 04/23/19  Drenda Freeze, MD    Allergies    Other and Lactose intolerance (gi)  Review of Systems   Review of Systems  Gastrointestinal: Positive for abdominal pain, nausea and vomiting.  All other systems reviewed and are negative.   Physical Exam Updated Vital Signs BP (!) 170/101   Pulse (!) 111   Temp 99 F (37.2 C) (Oral)   Resp 16   SpO2 100%   Physical Exam Vitals and nursing note reviewed.  Constitutional:      General: She is not in acute distress.    Appearance: She is well-developed.     Comments: Appears chronically ill, in no acute distress  HENT:     Head: Normocephalic and atraumatic.  Eyes:     Extraocular Movements: Extraocular movements intact.     Conjunctiva/sclera: Conjunctivae normal.      Pupils: Pupils are equal, round, and reactive to light.  Cardiovascular:     Rate  and Rhythm: Regular rhythm. Tachycardia present.     Pulses: Normal pulses.     Comments: Mildly tachycardic at 110 Pulmonary:     Effort: Pulmonary effort is normal. No respiratory distress.     Breath sounds: Normal breath sounds. No wheezing.  Abdominal:     General: There is no distension.     Palpations: Abdomen is soft. There is no mass.     Tenderness: There is abdominal tenderness. There is no guarding or rebound.     Comments: Generalized tenderness palpation the abdomen.  No rigidity, guarding, distention.  Negative rebound.  No peritonitis.  Musculoskeletal:        General: Normal range of motion.     Cervical back: Normal range of motion and neck supple.  Skin:    General: Skin is warm and dry.     Capillary Refill: Capillary refill takes less than 2 seconds.  Neurological:     Mental Status: She is alert and oriented to person, place, and time.     ED Results / Procedures / Treatments   Labs (all labs ordered are listed, but only abnormal results are displayed) Labs Reviewed  CBC WITH DIFFERENTIAL/PLATELET - Abnormal; Notable for the following components:      Result Value   RBC 3.22 (*)    Hemoglobin 8.5 (*)    HCT 28.0 (*)    All other components within normal limits  COMPREHENSIVE METABOLIC PANEL - Abnormal; Notable for the following components:   Potassium 3.3 (*)    Chloride 94 (*)    Glucose, Bld 248 (*)    BUN 26 (*)    Creatinine, Ser 1.31 (*)    Total Protein 8.7 (*)    Albumin 2.7 (*)    GFR calc non Af Amer 55 (*)    All other components within normal limits  URINALYSIS, ROUTINE W REFLEX MICROSCOPIC - Abnormal; Notable for the following components:   Glucose, UA 50 (*)    Hgb urine dipstick MODERATE (*)    Ketones, ur 5 (*)    Protein, ur >=300 (*)    All other components within normal limits  CBG MONITORING, ED - Abnormal; Notable for the following components:    Glucose-Capillary 226 (*)    All other components within normal limits  LIPASE, BLOOD    EKG None  Radiology No results found.  Procedures Procedures (including critical care time)  Medications Ordered in ED Medications  sodium chloride 0.9 % bolus 1,000 mL (0 mLs Intravenous Stopped 07/03/19 0200)  metoCLOPramide (REGLAN) injection 10 mg (10 mg Intravenous Given 07/03/19 0009)  alum & mag hydroxide-simeth (MAALOX/MYLANTA) 200-200-20 MG/5ML suspension 30 mL (30 mLs Oral Given 07/03/19 0219)    And  lidocaine (XYLOCAINE) 2 % viscous mouth solution 15 mL (15 mLs Oral Given 07/03/19 0219)    ED Course  I have reviewed the triage vital signs and the nursing notes.  Pertinent labs & imaging results that were available during my care of the patient were reviewed by me and considered in my medical decision making (see chart for details).    MDM Rules/Calculators/A&P                      Patient presenting for evaluation of nausea, vomiting, dental pain.  On exam, patient appears nontoxic.  She is mildly tachycardic, although per chart review this is common for her.  As she has symptoms that returned since being discharged in the ER earlier today,  will repeat labs.  Consider early DKA.  Lower suspicion for infection without fever.  Consider metabolic abnormality.  Consider gastroparesis.  Consider hyperemesis cannabinoid syndrome.   Labs interpreted by me, overall reassuring.  Similar, if not slightly improved from earlier today.  No leukocytosis.  Hemoglobin stable.  Creatinine stable.  Labs not consistent with DKA.  On reassessment after Reglan and fluids, patient reports symptoms are improved.  As such, favor gastroparesis as cause.  No further vomiting.  She continues to have pain.  Will give GI cocktail and p.o. challenge.  Patient tolerating p.o. without difficulty.  Discussed follow-up with her GI doctor as scheduled, and follow-up with her primary care doctor as needed for further  evaluation of her symptoms.  Discussed continued use of her home medicines.  At this time, patient appears safe for discharge.  Return precautions given.  Patient states she understands and agrees to plan.  Final Clinical Impression(s) / ED Diagnoses Final diagnoses:  Non-intractable vomiting with nausea, unspecified vomiting type    Rx / DC Orders ED Discharge Orders    None       Franchot Heidelberg, PA-C 07/03/19 0451    Molpus, Jenny Reichmann, MD 07/03/19 [phone]

## 2019-07-03 NOTE — ED Notes (Signed)
Pt up walking around the room. MD ok with VS for d/c. NAD noted. Pt denies any needs and is ready for discharge.

## 2019-07-03 NOTE — Discharge Instructions (Signed)
Continue taking home medications as prescribed.  Start with a liquid diet, and progress to a full diet as tolerated.  Follow-up with your stomach doctor as scheduled for further evaluation of your symptoms. Follow-up with your primary care doctor as needed for further evaluation. Return to the emergency room if you develop high fevers, persistent vomiting, vomiting blood, or any new, worsening, or concerning symptoms.

## 2019-07-04 ENCOUNTER — Emergency Department (HOSPITAL_COMMUNITY)
Admission: EM | Admit: 2019-07-04 | Discharge: 2019-07-04 | Disposition: A | Discharge status: Home / Self Care | Payer: 59 | Attending: Emergency Medicine | Admitting: Emergency Medicine

## 2019-07-04 ENCOUNTER — Other Ambulatory Visit: Payer: Self-pay

## 2019-07-04 ENCOUNTER — Encounter (HOSPITAL_COMMUNITY): Payer: Self-pay | Admitting: Emergency Medicine

## 2019-07-04 DIAGNOSIS — R112 Nausea with vomiting, unspecified: Secondary | ICD-10-CM | POA: Insufficient documentation

## 2019-07-04 DIAGNOSIS — I10 Essential (primary) hypertension: Secondary | ICD-10-CM | POA: Insufficient documentation

## 2019-07-04 DIAGNOSIS — Z794 Long term (current) use of insulin: Secondary | ICD-10-CM | POA: Insufficient documentation

## 2019-07-04 DIAGNOSIS — Z79899 Other long term (current) drug therapy: Secondary | ICD-10-CM | POA: Insufficient documentation

## 2019-07-04 DIAGNOSIS — R1013 Epigastric pain: Secondary | ICD-10-CM | POA: Diagnosis present

## 2019-07-04 DIAGNOSIS — E109 Type 1 diabetes mellitus without complications: Secondary | ICD-10-CM | POA: Diagnosis not present

## 2019-07-04 DIAGNOSIS — Z8616 Personal history of COVID-19: Secondary | ICD-10-CM | POA: Diagnosis not present

## 2019-07-04 LAB — CBC
HCT: 29.7 % — ABNORMAL LOW (ref 36.0–46.0)
Hemoglobin: 8.9 g/dL — ABNORMAL LOW (ref 12.0–15.0)
MCH: 25.9 pg — ABNORMAL LOW (ref 26.0–34.0)
MCHC: 30 g/dL (ref 30.0–36.0)
MCV: 86.3 fL (ref 80.0–100.0)
Platelets: 430 10*3/uL — ABNORMAL HIGH (ref 150–400)
RBC: 3.44 MIL/uL — ABNORMAL LOW (ref 3.87–5.11)
RDW: 12.4 % (ref 11.5–15.5)
WBC: 10.6 10*3/uL — ABNORMAL HIGH (ref 4.0–10.5)
nRBC: 0 % (ref 0.0–0.2)

## 2019-07-04 LAB — URINALYSIS, ROUTINE W REFLEX MICROSCOPIC
Bacteria, UA: NONE SEEN
Bilirubin Urine: NEGATIVE
Glucose, UA: 50 mg/dL — AB
Ketones, ur: NEGATIVE mg/dL
Leukocytes,Ua: NEGATIVE
Nitrite: NEGATIVE
Protein, ur: >=300 mg/dL — AB
Specific Gravity, Urine: 1.013 (ref 1.005–1.030)
pH: 5 (ref 5.0–8.0)

## 2019-07-04 LAB — LIPASE, BLOOD: Lipase: 17 U/L (ref 11–51)

## 2019-07-04 LAB — COMPREHENSIVE METABOLIC PANEL
ALT: 13 U/L (ref 0–44)
AST: 15 U/L (ref 15–41)
Albumin: 2.7 g/dL — ABNORMAL LOW (ref 3.5–5.0)
Alkaline Phosphatase: 89 U/L (ref 38–126)
Anion gap: 11 (ref 5–15)
BUN: 24 mg/dL — ABNORMAL HIGH (ref 6–20)
CO2: 34 mmol/L — ABNORMAL HIGH (ref 22–32)
Calcium: 8.8 mg/dL — ABNORMAL LOW (ref 8.9–10.3)
Chloride: 94 mmol/L — ABNORMAL LOW (ref 98–111)
Creatinine, Ser: 1.29 mg/dL — ABNORMAL HIGH (ref 0.44–1.00)
GFR calc Af Amer: >60 mL/min (ref >60)
GFR calc non Af Amer: 56 mL/min — ABNORMAL LOW (ref >60)
Glucose, Bld: 191 mg/dL — ABNORMAL HIGH (ref 70–99)
Potassium: 2.8 mmol/L — ABNORMAL LOW (ref 3.5–5.1)
Sodium: 139 mmol/L (ref 135–145)
Total Bilirubin: 0.8 mg/dL (ref 0.3–1.2)
Total Protein: 8.4 g/dL — ABNORMAL HIGH (ref 6.5–8.1)

## 2019-07-04 MED ORDER — SODIUM CHLORIDE 0.9% FLUSH: 3.0000 mL | Freq: Once | INTRAVENOUS | Status: DC

## 2019-07-04 MED ORDER — ALUM & MAG HYDROXIDE-SIMETH 200-200-20 MG/5ML PO SUSP
30.0000 mL | Freq: Once | ORAL | Status: AC
  Administered 2019-07-04: 30 mL via ORAL
  Filled 2019-07-04: qty 30

## 2019-07-04 MED ORDER — POTASSIUM CHLORIDE CRYS ER 20 MEQ PO TBCR: 20.0000 meq | EXTENDED_RELEASE_TABLET | Freq: Every day | ORAL | 0 refills | Status: DC

## 2019-07-04 MED ORDER — SODIUM CHLORIDE 0.9 % IV BOLUS
1000.0000 mL | Freq: Once | INTRAVENOUS | Status: AC
  Administered 2019-07-04: 1000 mL via INTRAVENOUS

## 2019-07-04 MED ORDER — POTASSIUM CHLORIDE CRYS ER 20 MEQ PO TBCR
40.0000 meq | EXTENDED_RELEASE_TABLET | Freq: Once | ORAL | Status: AC
  Administered 2019-07-04: 40 meq via ORAL
  Filled 2019-07-04: qty 2

## 2019-07-04 MED ORDER — LIDOCAINE VISCOUS HCL 2 % MT SOLN
15.0000 mL | Freq: Once | OROMUCOSAL | Status: AC
  Administered 2019-07-04: 15 mL via ORAL
  Filled 2019-07-04: qty 15

## 2019-07-04 NOTE — ED Triage Notes (Signed)
Pt c/o abd pains with n/v/d that started again today when she woke up.

## 2019-07-04 NOTE — Discharge Instructions (Addendum)
Your laboratory results were within normal limits aside from potassium, which I have written a prescription for.  Please take 1 tablet daily for the next 5 days, you will need to have your potassium rechecked by your primary care physician.

## 2019-07-04 NOTE — ED Provider Notes (Signed)
[name] Provider Note   CSN: [id number] Arrival date & time: 07/04/19  1602     History Chief Complaint  Patient presents with  . Abdominal Pain  . Emesis  . Diarrhea    [name] is a 29 y.o. female.  29 y.o female with a PMH of recurrent vomiting resents to the ED with a chief complaint of upper epigastric pain bandlike which began yesterday.  Valuated in the ED yesterday, states that she tried to write in a liquid diet.  Has continued to have episodes of emesis.  She reports the pain has become severe.  She has attempted taking some promethazine.  Last THC consumption was 2 days ago.    The history is provided by the patient and medical records.  Abdominal Pain Pain location:  Epigastric Pain quality: burning   Pain radiates to:  Does not radiate Pain severity:  Moderate Onset quality:  Gradual Duration:  1 day Timing:  Constant Progression:  Worsening Chronicity:  Recurrent Context: not diet changes, not previous surgeries and not suspicious food intake   Relieved by:  Nothing Worsened by:  Movement Associated symptoms: diarrhea and vomiting   Associated symptoms: no chest pain, no chills, no constipation, no fever, no hematemesis, no hematuria, no shortness of breath, no vaginal bleeding and no vaginal discharge   Risk factors: no alcohol abuse, has not had multiple surgeries and not pregnant   Emesis Associated symptoms: abdominal pain and diarrhea   Associated symptoms: no chills and no fever   Diarrhea Associated symptoms: abdominal pain and vomiting   Associated symptoms: no chills and no fever        Past Medical History:  Diagnosis Date  . Allergy   . Anemia   . Anxiety   . Blood transfusion without reported diagnosis    Dec 2018  . Cataract    right eye  . COVID-19   . Depression   . Diabetes type 1, uncontrolled ([place]) 11/14/2011   Since age 53  . Fibromyalgia   . Gastroparesis   . GERD  (gastroesophageal reflux disease)   . Hypertension   . Infection    UTI April 2016    Patient Active Problem List   Diagnosis Date Noted  . Nausea & vomiting 04/30/2019  . Hypertensive urgency 04/30/2019  . Cannabinoid hyperemesis syndrome 04/30/2019  . Contraception management 08/06/2018  . Cyclical vomiting syndrome 06/07/2018  . Diabetic gastroparesis associated with type 1 diabetes mellitus ([place]) 06/04/2018  . Anxiety 05/31/2018  . Peripheral edema 05/31/2018  . Normocytic anemia 07/10/2017  . GERD (gastroesophageal reflux disease) 06/23/2017  . Intractable nausea and vomiting 04/11/2017  . MDD (major depressive disorder), recurrent episode, mild ([place]) 02/28/2017  . Diabetes mellitus type 1 ([place]) 04/28/2013  . Protein-calorie malnutrition, severe ([place]) 04/28/2013  . Hypertension associated with diabetes ([place]) 11/14/2011  . Chronic abdominal pain 09/18/2011    Past Surgical History:  Procedure Laterality Date  . ANKLE SURGERY    . CHOLECYSTECTOMY  11/15/2011   Procedure: LAPAROSCOPIC CHOLECYSTECTOMY WITH INTRAOPERATIVE CHOLANGIOGRAM;  Surgeon: [name], MD;  Location: [facility];  Service: General;  Laterality: N/A;  . COLONOSCOPY    . COLONOSCOPY WITH PROPOFOL N/A 06/27/2017   Procedure: COLONOSCOPY WITH PROPOFOL;  Surgeon: Milus Banister, MD;  Location: [facility];  Service: Endoscopy;  Laterality: N/A;  . ESOPHAGOGASTRODUODENOSCOPY  12/03/2011   Procedure: ESOPHAGOGASTRODUODENOSCOPY (EGD);  Surgeon: Beryle Beams, MD;  Location: [facility];  Service: Endoscopy;  Laterality: N/A;  . FLEXIBLE SIGMOIDOSCOPY N/A 03/10/2017   Procedure: FLEXIBLE SIGMOIDOSCOPY;  Surgeon: Carol Ada, MD;  Location: [facility];  Service: Endoscopy;  Laterality: N/A;  . INCISION AND DRAINAGE PERIRECTAL ABSCESS N/A 03/01/2017   Procedure: IRRIGATION AND DEBRIDEMENT PERIRECTAL ABSCESS;  Surgeon: Alphonsa Overall, MD;  Location: [facility];  Service: General;  Laterality: N/A;  . IRRIGATION AND  DEBRIDEMENT BUTTOCKS N/A 03/23/2017   Procedure: IRRIGATION AND DEBRIDEMENT BUTTOCKS, SETON PLACEMENT;  Surgeon: [name], MD;  Location: [facility];  Service: General;  Laterality: N/A;  . LAPAROSCOPY  11/23/2011   Procedure: LAPAROSCOPY DIAGNOSTIC;  Surgeon: Edward Jolly, MD;  Location: [facility];  Service: General;  Laterality: N/A;  . SIGMOIDOSCOPY    . UPPER GASTROINTESTINAL ENDOSCOPY    . WISDOM TOOTH EXTRACTION       OB History    Gravida  1   Para  1   Term      Preterm  1   AB      Living  1     SAB      TAB      Ectopic      Multiple  0   Live Births  1           Family History  Problem Relation Age of Onset  . Diabetes Mother   . Hypertension Father   . Colon cancer Paternal Grandmother        pt thinks PGM was dx in her 9's  . Diabetes Paternal Grandmother   . Diabetes Maternal Grandmother   . Diabetes Maternal Grandfather   . Diabetes Paternal Grandfather   . Diabetes Other   . Esophageal cancer Neg Hx   . Liver cancer Neg Hx   . Pancreatic cancer Neg Hx   . Stomach cancer Neg Hx   . Rectal cancer Neg Hx     Social History   Tobacco Use  . Smoking status: Never Smoker  . Smokeless tobacco: Never Used  Substance Use Topics  . Alcohol use: No    Alcohol/week: 0.0 standard drinks  . Drug use: Yes    Types: Marijuana    Home Medications Prior to Admission medications   Medication Sig Start Date End Date Taking? Authorizing Provider  aluminum-magnesium hydroxide-simethicone (MAALOX) 202-542-70 MG/5ML SUSP Take 30 mLs by mouth 3 (three) times daily as needed (indigestion).   Yes [provider]  cyclobenzaprine (FLEXERIL) 10 MG tablet Take 1 tablet (10 mg total) by mouth 3 (three) times daily as needed for muscle spasms. 06/07/19  Yes [name], MD  dicyclomine (BENTYL) 20 MG tablet Take 1 tablet (20 mg total) by mouth 3 (three) times daily as needed for spasms. 05/01/19  Yes [name], MD  furosemide  (LASIX) 40 MG tablet TAKE 1 TABLET BY MOUTH  DAILY Patient taking differently: Take 40 mg by mouth daily.  11/11/18  Yes Armbruster, Carlota Raspberry, MD  hydrALAZINE (APRESOLINE) 50 MG tablet Take 1 tablet (50 mg total) by mouth every 6 (six) hours. Patient taking differently: Take 50 mg by mouth 2 (two) times daily.  10/23/18  Yes Swayze, Ava, DO  Insulin Glargine (LANTUS SOLOSTAR) 100 UNIT/ML Solostar Pen Inject 10 Units into the skin at bedtime. And pen needles 4/day 12/31/18  Yes Renato Shin, MD  insulin lispro (HUMALOG KWIKPEN) 100 UNIT/ML KwikPen Inject 0.06 mLs (6 Units total) into the skin 3 (three) times daily. Patient taking differently: Inject 1-9 Units into the skin 3 (three) times daily.  Per sliding scale 12/31/18  Yes Renato Shin, MD  loperamide (IMODIUM) 2 MG capsule Take 6 mg by mouth as needed for diarrhea or loose stools.   Yes [provider]  LORazepam (ATIVAN) 0.5 MG tablet Take 1 tablet (0.5 mg total) by mouth 2 (two) times daily as needed for anxiety. 06/30/19  Yes [name], MD  metoCLOPramide (REGLAN) 10 MG tablet Take 1 tablet (10 mg total) by mouth every 8 (eight) hours as needed for nausea (nausea/headache). 06/23/19  Yes Palumbo, April, MD  metoprolol tartrate (LOPRESSOR) 25 MG tablet Take 25 mg by mouth 2 (two) times daily.   Yes [provider]  mirtazapine (REMERON) 30 MG tablet TAKE 1 TABLET BY MOUTH AT  BEDTIME Patient taking differently: Take 30 mg by mouth at bedtime.  04/19/19  Yes [name], MD  OVER THE COUNTER MEDICATION Take 2 each by mouth daily. Flo Chewable Gummies for period   Yes [provider]  pantoprazole (PROTONIX) 40 MG tablet TAKE 1 TABLET BY MOUTH  DAILY Patient taking differently: Take 40 mg by mouth daily.  10/22/18  Yes Armbruster, Carlota Raspberry, MD  promethazine (PHENERGAN) 12.5 MG tablet TAKE 1 TABLET BY MOUTH EVERY 8 HOURS AS NEEDED FOR NAUSEA 06/27/19  Yes Armbruster, Carlota Raspberry, MD  promethazine (PHENERGAN) 25 MG  suppository Place 1 suppository (25 mg total) rectally every 8 (eight) hours as needed for nausea or vomiting. Please schedule and keep an OV for ANY further refills.  DO NOT take oral phenergan and suppositories at the same time. 06/27/19  Yes Armbruster, Carlota Raspberry, MD  sucralfate (CARAFATE) 1 GM/10ML suspension Take 10 mLs (1 g total) by mouth 4 (four) times daily -  with meals and at bedtime. 06/23/19  Yes Palumbo, April, MD  Continuous Blood Gluc Receiver ([place]) [place] See admin instructions. USE AS DIRECTED TO MONITOR GLUCOSE LEVELS 10/29/18   [provider]  Continuous Blood Gluc Sensor (DEXCOM G6 SENSOR) MISC 1 each by Does not apply route See admin instructions. every 10 days. 12/31/18   Renato Shin, MD  Continuous Blood Gluc Transmit (DEXCOM G6 TRANSMITTER) MISC See admin instructions. USE AS DIRECTED TO MONITOR GLUCOSE LEVELS 10/29/18   [provider]  metoprolol tartrate (LOPRESSOR) 25 MG tablet Take 1 tablet (25 mg total) by mouth 2 (two) times daily. 05/22/19 06/23/19  Arrien, Jimmy Picket, MD  norethindrone (ORTHO MICRONOR) 0.35 MG tablet Take 1 tablet (0.35 mg total) by mouth daily. 06/30/19   [name], MD  potassium chloride (KLOR-CON) 10 MEQ tablet Take 1 tablet (10 mEq total) by mouth daily for 4 days. 07/02/19 07/06/19  Alroy Bailiff, Margaux, PA-C  potassium chloride SA (KLOR-CON) 20 MEQ tablet Take 1 tablet (20 mEq total) by mouth daily for 5 doses. 07/04/19 07/09/19  Janeece Fitting, PA-C  promethazine (PHENERGAN) 25 MG suppository Place 1 suppository (25 mg total) rectally every 6 (six) hours as needed for nausea or vomiting. Patient not taking: Reported on 07/04/2019 05/28/19   Ezequiel Essex, MD  promethazine (PHENERGAN) 25 MG tablet Take 1 tablet (25 mg total) by mouth every 8 (eight) hours as needed for nausea or vomiting. Patient not taking: Reported on 06/30/2019 06/23/19   Sherwood Gambler, MD    Allergies    Other and Lactose intolerance (gi)  Review of  Systems   Review of Systems  Constitutional: Negative for chills and fever.  Respiratory: Negative for shortness of breath.   Cardiovascular: Negative for chest pain.  Gastrointestinal:  Positive for abdominal pain, diarrhea and vomiting. Negative for constipation and hematemesis.  Genitourinary: Negative for hematuria, vaginal bleeding and vaginal discharge.  All other systems reviewed and are negative.   Physical Exam Updated Vital Signs BP (!) 144/76   Pulse (!) 104   Temp 97.6 F (36.4 C) (Oral)   Resp 12   SpO2 99%   Physical Exam Vitals and nursing note reviewed.  Constitutional:      Appearance: She is well-developed.  HENT:     Head: Normocephalic and atraumatic.  Cardiovascular:     Rate and Rhythm: Normal rate.  Pulmonary:     Effort: Pulmonary effort is normal.     Breath sounds: No wheezing or rales.  Abdominal:     General: Abdomen is flat.     Palpations: Abdomen is soft.     Tenderness: There is abdominal tenderness in the epigastric area. There is no right CVA tenderness, left CVA tenderness, guarding or rebound. Negative signs include Murphy's sign and McBurney's sign.     Hernia: No hernia is present.  Skin:    General: Skin is warm and dry.  Neurological:     Mental Status: She is alert.     ED Results / Procedures / Treatments   Labs (all labs ordered are listed, but only abnormal results are displayed) Labs Reviewed  COMPREHENSIVE METABOLIC PANEL - Abnormal; Notable for the following components:      Result Value   Potassium 2.8 (*)    Chloride 94 (*)    CO2 34 (*)    Glucose, Bld 191 (*)    BUN 24 (*)    Creatinine, Ser 1.29 (*)    Calcium 8.8 (*)    Total Protein 8.4 (*)    Albumin 2.7 (*)    GFR calc non Af Amer 56 (*)    All other components within normal limits  CBC - Abnormal; Notable for the following components:   WBC 10.6 (*)    RBC 3.44 (*)    Hemoglobin 8.9 (*)    HCT 29.7 (*)    MCH 25.9 (*)    Platelets 430 (*)    All  other components within normal limits  URINALYSIS, ROUTINE W REFLEX MICROSCOPIC - Abnormal; Notable for the following components:   APPearance HAZY (*)    Glucose, UA 50 (*)    Hgb urine dipstick SMALL (*)    Protein, ur >=300 (*)    All other components within normal limits  LIPASE, BLOOD  PREGNANCY, URINE    EKG EKG Interpretation  Date/Time:  Monday July 04 2019 18:24:47 EDT Ventricular Rate:  103 PR Interval:    QRS Duration: 83 QT Interval:  338 QTC Calculation: 443 R Axis:   67 Text Interpretation: Sinus tachycardia Borderline Q waves in inferior leads Confirmed by Dene Gentry [id number]) on 07/04/2019 6:28:03 PM   Radiology No results found.  Procedures Procedures (including critical care time)  Medications Ordered in ED Medications  alum & mag hydroxide-simeth (MAALOX/MYLANTA) 200-200-20 MG/5ML suspension 30 mL (30 mLs Oral Given 07/04/19 1816)    And  lidocaine (XYLOCAINE) 2 % viscous mouth solution 15 mL (15 mLs Oral Given 07/04/19 1816)  sodium chloride 0.9 % bolus 1,000 mL (0 mLs Intravenous Stopped 07/04/19 1848)  potassium chloride SA (KLOR-CON) CR tablet 40 mEq (40 mEq Oral Given 07/04/19 1920)    ED Course  I have reviewed the triage vital signs and the nursing notes.  Pertinent labs & imaging results that  were available during my care of the patient were reviewed by me and considered in my medical decision making (see chart for details).  Clinical Course as of Jul 04 1939  Mon Jul 04, 2019  1931 Orally replaced as she does not have a current IV  Potassium(!): 2.8 [JS]    Clinical Course User Index [JS] Janeece Fitting, PA-C   MDM Rules/Calculators/A&P     Patient with recurrent visits to the ED presents today with vomiting and abdominal pain.  Previously seen 2 days ago for the same complaint, reports vomiting improved after discharge however she now has a recurrent pain along the epigastric region.  She has taken some of her medication at home consisting of  promethazine without improvement in her symptoms.  She is pacing room appears uncomfortable, tenderness to palpation along the epigastric region.  There is no rebound, guarding.  No lower abdominal pain, no gynecological complaints on today's visit.  Patient is a difficult IV access, IV consultation was placed.  Interpretation of her labs revealed a CBC with mild leukocytosis, suspect likely due to vomiting.  Hemoglobin is within normal limits.  CMP remarkable for hypokalemia, worsening than yesterday, creatinine level is elevated.  No anion gap, CO2 is slightly elevated but UA without any ketones, doubt DKA.  He has not had any vomiting episodes while in the ED.  Urine without any nitrites or signs of infection.  Was provided with Maalox and viscous lidocaine which have helped with her symptoms.   Unfortunately, patient's IV infiltrate she was able to receive some fluids partially. Her potassium is low on today's visit at 2.8, I attempted to give patient IV runs of K+ but she does not want a another IV placed as she had to be ultrasound I beat.  She was given oral potassium on today's visit 40 mEq. She is requesting discharge home to as she reports resolution of her symptoms.  We discussed the risks of going home without having IV potassium replacement on today's visit, however patient is adamant that she is ready to be discharged home.I will provide a prescription for K+ supplementation daily for the next 5 days.She is to have this rechecked by PCP, and is agreeable.   After review of patient's records, she has several visits for the same complaint.  Vitals are within normal limits, patient stable for discharge.   Portions of this note were generated with [employer]. Dictation errors may occur despite best attempts at proofreading.  Final Clinical Impression(s) / ED Diagnoses Final diagnoses:  Non-intractable vomiting with nausea, unspecified vomiting type    Rx / DC Orders ED  Discharge Orders         Ordered    potassium chloride SA (KLOR-CON) 20 MEQ tablet  Daily     07/04/19 1940           Janeece Fitting, Hershal Coria 07/04/19 1941    Valarie Merino, MD 07/07/19 [phone]

## 2019-07-04 NOTE — ED Notes (Signed)
2 failed attempts to get blood work

## 2019-07-04 NOTE — ED Notes (Signed)
ED Provider at bedside. 

## 2019-07-29 ENCOUNTER — Emergency Department (HOSPITAL_COMMUNITY)
Admission: EM | Admit: 2019-07-29 | Discharge: 2019-07-30 | Disposition: A | Discharge status: Home / Self Care | Payer: 59 | Attending: Emergency Medicine | Admitting: Emergency Medicine

## 2019-07-29 ENCOUNTER — Encounter (HOSPITAL_COMMUNITY): Payer: Self-pay

## 2019-07-29 ENCOUNTER — Other Ambulatory Visit: Payer: Self-pay

## 2019-07-29 DIAGNOSIS — R112 Nausea with vomiting, unspecified: Secondary | ICD-10-CM | POA: Diagnosis present

## 2019-07-29 DIAGNOSIS — I10 Essential (primary) hypertension: Secondary | ICD-10-CM | POA: Insufficient documentation

## 2019-07-29 DIAGNOSIS — Z794 Long term (current) use of insulin: Secondary | ICD-10-CM | POA: Insufficient documentation

## 2019-07-29 DIAGNOSIS — Z79899 Other long term (current) drug therapy: Secondary | ICD-10-CM | POA: Insufficient documentation

## 2019-07-29 DIAGNOSIS — E109 Type 1 diabetes mellitus without complications: Secondary | ICD-10-CM | POA: Diagnosis not present

## 2019-07-29 DIAGNOSIS — R1115 Cyclical vomiting syndrome unrelated to migraine: Secondary | ICD-10-CM

## 2019-07-29 LAB — CBG MONITORING, ED: Glucose-Capillary: 171 mg/dL — ABNORMAL HIGH (ref 70–99)

## 2019-07-29 MED ORDER — LIDOCAINE VISCOUS HCL 2 % MT SOLN
15.0000 mL | Freq: Once | OROMUCOSAL | Status: AC
  Administered 2019-07-30: 15 mL via ORAL
  Filled 2019-07-29: qty 15

## 2019-07-29 MED ORDER — DROPERIDOL 2.5 MG/ML IJ SOLN
2.5000 mg | Freq: Once | INTRAMUSCULAR | Status: AC
  Administered 2019-07-30: 2.5 mg via INTRAVENOUS
  Filled 2019-07-29: qty 2

## 2019-07-29 MED ORDER — ALUM & MAG HYDROXIDE-SIMETH 200-200-20 MG/5ML PO SUSP
30.0000 mL | Freq: Once | ORAL | Status: AC
  Administered 2019-07-30: 30 mL via ORAL
  Filled 2019-07-29: qty 30

## 2019-07-29 MED ORDER — SODIUM CHLORIDE 0.9 % IV BOLUS (SEPSIS)
1000.0000 mL | Freq: Once | INTRAVENOUS | Status: AC
  Administered 2019-07-30: 1000 mL via INTRAVENOUS

## 2019-07-29 NOTE — ED Provider Notes (Addendum)
TIME SEEN: 11:25 PM  CHIEF COMPLAINT: Nausea, vomiting, upper abdominal pain  HPI: Patient is a 29 year old female with history of type 1 diabetes, gastroparesis, well-known to our emergency department with frequent visits for nausea, vomiting and upper abdominal pain.  Presents today with over 10 episodes of nonbilious, nonbloody vomiting as well as some intermittent nonbloody diarrhea.  Reports epigastric discomfort that goes into her back.  These are her typical symptoms.  She is followed by GI.  There is also component of cannabinoid hyperemesis syndrome.  No fevers.  No chest pain or shortness of breath.  ROS: See HPI Constitutional: no fever  Eyes: no drainage  ENT: no runny nose   Cardiovascular:  no chest pain  Resp: no SOB  GI: Vomiting and diarrhea GU: no dysuria Integumentary: no rash  Allergy: no hives  Musculoskeletal: no leg swelling  Neurological: no slurred speech ROS otherwise negative  PAST MEDICAL HISTORY/PAST SURGICAL HISTORY:  Past Medical History:  Diagnosis Date  . Allergy   . Anemia   . Anxiety   . Blood transfusion without reported diagnosis    Dec 2018  . Cataract    right eye  . COVID-19   . Depression   . Diabetes type 1, uncontrolled ([place]) 11/14/2011   Since age 31  . Fibromyalgia   . Gastroparesis   . GERD (gastroesophageal reflux disease)   . Hypertension   . Infection    UTI April 2016    MEDICATIONS:  Prior to Admission medications   Medication Sig Start Date End Date Taking? Authorizing Provider  aluminum-magnesium hydroxide-simethicone (MAALOX) 182-993-71 MG/5ML SUSP Take 30 mLs by mouth 3 (three) times daily as needed (indigestion).    [provider]  Continuous Blood Gluc Receiver ([place]) [place] See admin instructions. USE AS DIRECTED TO MONITOR GLUCOSE LEVELS 10/29/18   [provider]  Continuous Blood Gluc Sensor (DEXCOM G6 SENSOR) MISC 1 each by Does not apply route See admin instructions. every 10  days. 12/31/18   Renato Shin, MD  Continuous Blood Gluc Transmit (DEXCOM G6 TRANSMITTER) MISC See admin instructions. USE AS DIRECTED TO MONITOR GLUCOSE LEVELS 10/29/18   [provider]  cyclobenzaprine (FLEXERIL) 10 MG tablet Take 1 tablet (10 mg total) by mouth 3 (three) times daily as needed for muscle spasms. 06/07/19   [name], MD  dicyclomine (BENTYL) 20 MG tablet Take 1 tablet (20 mg total) by mouth 3 (three) times daily as needed for spasms. 05/01/19   [name], MD  furosemide (LASIX) 40 MG tablet TAKE 1 TABLET BY MOUTH  DAILY Patient taking differently: Take 40 mg by mouth daily.  11/11/18   Armbruster, Carlota Raspberry, MD  hydrALAZINE (APRESOLINE) 50 MG tablet Take 1 tablet (50 mg total) by mouth every 6 (six) hours. Patient taking differently: Take 50 mg by mouth 2 (two) times daily.  10/23/18   Swayze, Ava, DO  Insulin Glargine (LANTUS SOLOSTAR) 100 UNIT/ML Solostar Pen Inject 10 Units into the skin at bedtime. And pen needles 4/day 12/31/18   Renato Shin, MD  insulin lispro (HUMALOG KWIKPEN) 100 UNIT/ML KwikPen Inject 0.06 mLs (6 Units total) into the skin 3 (three) times daily. Patient taking differently: Inject 1-9 Units into the skin 3 (three) times daily. Per sliding scale 12/31/18   Renato Shin, MD  loperamide (IMODIUM) 2 MG capsule Take 6 mg by mouth as needed for diarrhea or loose stools.    [provider]  LORazepam (ATIVAN) 0.5 MG tablet Take 1  tablet (0.5 mg total) by mouth 2 (two) times daily as needed for anxiety. 06/30/19   [name], MD  metoCLOPramide (REGLAN) 10 MG tablet Take 1 tablet (10 mg total) by mouth every 8 (eight) hours as needed for nausea (nausea/headache). 06/23/19   Palumbo, April, MD  metoprolol tartrate (LOPRESSOR) 25 MG tablet Take 1 tablet (25 mg total) by mouth 2 (two) times daily. 05/22/19 06/23/19  Arrien, Jimmy Picket, MD  metoprolol tartrate (LOPRESSOR) 25 MG tablet Take 25 mg by mouth 2 (two) times daily.     [provider]  mirtazapine (REMERON) 30 MG tablet TAKE 1 TABLET BY MOUTH AT  BEDTIME Patient taking differently: Take 30 mg by mouth at bedtime.  04/19/19   [name], MD  norethindrone (ORTHO MICRONOR) 0.35 MG tablet Take 1 tablet (0.35 mg total) by mouth daily. 06/30/19   [name], MD  OVER THE COUNTER MEDICATION Take 2 each by mouth daily. Flo Chewable Gummies for period    [provider]  pantoprazole (PROTONIX) 40 MG tablet TAKE 1 TABLET BY MOUTH  DAILY Patient taking differently: Take 40 mg by mouth daily.  10/22/18   Armbruster, Carlota Raspberry, MD  potassium chloride (KLOR-CON) 10 MEQ tablet Take 1 tablet (10 mEq total) by mouth daily for 4 days. 07/02/19 07/06/19  Alroy Bailiff, Margaux, PA-C  potassium chloride SA (KLOR-CON) 20 MEQ tablet Take 1 tablet (20 mEq total) by mouth daily for 5 doses. 07/04/19 07/09/19  Janeece Fitting, PA-C  promethazine (PHENERGAN) 12.5 MG tablet TAKE 1 TABLET BY MOUTH EVERY 8 HOURS AS NEEDED FOR NAUSEA 06/27/19   Armbruster, Carlota Raspberry, MD  promethazine (PHENERGAN) 25 MG suppository Place 1 suppository (25 mg total) rectally every 6 (six) hours as needed for nausea or vomiting. Patient not taking: Reported on 07/04/2019 05/28/19   Ezequiel Essex, MD  promethazine (PHENERGAN) 25 MG suppository Place 1 suppository (25 mg total) rectally every 8 (eight) hours as needed for nausea or vomiting. Please schedule and keep an OV for ANY further refills.  DO NOT take oral phenergan and suppositories at the same time. 06/27/19   Armbruster, Carlota Raspberry, MD  promethazine (PHENERGAN) 25 MG tablet Take 1 tablet (25 mg total) by mouth every 8 (eight) hours as needed for nausea or vomiting. Patient not taking: Reported on 06/30/2019 06/23/19   Sherwood Gambler, MD  sucralfate (CARAFATE) 1 GM/10ML suspension Take 10 mLs (1 g total) by mouth 4 (four) times daily -  with meals and at bedtime. 06/23/19   Palumbo, April, MD    ALLERGIES:  Allergies  Allergen Reactions  . Other  Anaphylaxis    Reaction to [place] nuts   . Lactose Intolerance (Gi) Diarrhea    SOCIAL HISTORY:  Social History   Tobacco Use  . Smoking status: Never Smoker  . Smokeless tobacco: Never Used  Substance Use Topics  . Alcohol use: No    Alcohol/week: 0.0 standard drinks    FAMILY HISTORY: Family History  Problem Relation Age of Onset  . Diabetes Mother   . Hypertension Father   . Colon cancer Paternal Grandmother        pt thinks PGM was dx in her 26's  . Diabetes Paternal Grandmother   . Diabetes Maternal Grandmother   . Diabetes Maternal Grandfather   . Diabetes Paternal Grandfather   . Diabetes Other   . Esophageal cancer Neg Hx   . Liver cancer Neg Hx   . Pancreatic cancer Neg Hx   .  Stomach cancer Neg Hx   . Rectal cancer Neg Hx     EXAM: BP (!) 197/122 (BP Location: Left Arm)   Pulse (!) 118   Temp 98.9 F (37.2 C) (Oral)   Resp 18   Ht 5' 7"  (1.702 m)   Wt 49.9 kg   SpO2 100%   BMI 17.23 kg/m  CONSTITUTIONAL: Alert and oriented and responds appropriately to questions.  Chronically ill-appearing, thin, tearful, standing up at the sink and splashing water on her face when I entered the room HEAD: Normocephalic EYES: Conjunctivae clear, pupils appear equal, EOM appear intact ENT: normal nose; moist mucous membranes NECK: Supple, normal ROM CARD: Regular and tachycardic; S1 and S2 appreciated; no murmurs, no clicks, no rubs, no gallops RESP: Normal chest excursion without splinting or tachypnea; breath sounds clear and equal bilaterally; no wheezes, no rhonchi, no rales, no hypoxia or respiratory distress, speaking full sentences ABD/GI: Normal bowel sounds; non-distended; soft, non-tender, no rebound, no guarding, no peritoneal signs, no hepatosplenomegaly, negative Murphy sign, no tenderness at McBurney's point BACK:  The back appears normal EXT: Normal ROM in all joints; no deformity noted, no edema; no cyanosis SKIN: Normal color for age and race; warm;  no rash on exposed skin NEURO: Moves all extremities equally PSYCH: The patient's mood and manner are appropriate.   MEDICAL DECISION MAKING: Patient here with nausea, vomiting, diarrhea, epigastric pain.  Presents similarly to the emergency department frequently.  Has history of diabetes, gastroparesis, cannabinoid hyperemesis syndrome.  It appears she has responded well recently to droperidol, Reglan, Phenergan.  EKG reviewed/interpreted and shows sinus tach, no QT abnormality.  We will proceed with droperidol. Will give GI cocktail for pain. Will give IV fluids given she is tachycardic he reports frequent vomiting today.  Will check labs.  Abdominal exam is benign.  Will check CBG, VBG, urine to evaluate for DKA given her history of diabetes.  ED PROGRESS: 12:35 AM  Labs, urine reviewed/interpreted.  She is anemic and hemoglobin appears stable.  Her electrolytes are normal today.  Glucose in the 170s.  VBG does not suggest DKA.  Urine only shows small ketones without infection.  Pregnancy test is negative.  She reports feeling better after droperidol.  She is now smiling.  Will fluid challenge patient.    Pt's PIV infiltrated prior to receiving IVF.  Given reassuring labs, urine today, will re-hydrate orally.   2:00 AM  Patient continues to be tachycardic and hypertensive but on review of records this is her normal presentation.  HR improved to 109.  She appears markedly improved and is tolerating oral fluids.  She has drank 16 ounces of water here without recurrent vomiting or diarrhea.  I feel she is safe for discharge home.   Refill of Phenergan sent to patient's pharmacy per her request.   At this time, I do not feel there is any life-threatening condition present. I have reviewed, interpreted and discussed all results (EKG, imaging, lab, urine as appropriate) and exam findings with patient/family. I have reviewed nursing notes and appropriate previous records.  I feel the patient is safe to  be discharged home without further emergent workup and can continue workup as an outpatient as needed. Discussed usual and customary return precautions. Patient/family verbalize understanding and are comfortable with this plan.  Outpatient follow-up has been provided as needed. All questions have been answered.     EKG Interpretation  Date/Time:  Friday July 29 2019 23:33:14 EDT Ventricular Rate:  116 PR Interval:  QRS Duration: 77 QT Interval:  331 QTC Calculation: 460 R Axis:   61 Text Interpretation: Sinus tachycardia No significant change since last tracing Confirmed by Pryor Curia [id number]) on 07/29/2019 08:71:99 PM         Glyn Ade Schertzer was evaluated in Emergency Department on 07/29/2019 for the symptoms described in the history of present illness. She was evaluated in the context of the global COVID-19 pandemic, which necessitated consideration that the patient might be at risk for infection with the SARS-CoV-2 virus that causes COVID-19. Institutional protocols and algorithms that pertain to the evaluation of patients at risk for COVID-19 are in a state of rapid change based on information released by regulatory bodies including the CDC and federal and state organizations. These policies and algorithms were followed during the patient's care in the ED.      Offie Pickron, Delice Bison, DO 07/30/19 0200    Noemie Devivo, Delice Bison, DO 07/30/19 0211

## 2019-07-29 NOTE — ED Triage Notes (Addendum)
Pt hard to understand. Sts nausea and vomiting approx 10 episodes and upper back pain.

## 2019-07-30 LAB — URINALYSIS, ROUTINE W REFLEX MICROSCOPIC
Bacteria, UA: NONE SEEN
Bilirubin Urine: NEGATIVE
Glucose, UA: NEGATIVE mg/dL
Ketones, ur: 5 mg/dL — AB
Leukocytes,Ua: NEGATIVE
Nitrite: NEGATIVE
Protein, ur: >=300 mg/dL — AB
RBC / HPF: >50 RBC/hpf — ABNORMAL HIGH (ref 0–5)
Specific Gravity, Urine: 1.013 (ref 1.005–1.030)
pH: 9 — ABNORMAL HIGH (ref 5.0–8.0)

## 2019-07-30 LAB — LIPASE, BLOOD: Lipase: 16 U/L (ref 11–51)

## 2019-07-30 LAB — CBC WITH DIFFERENTIAL/PLATELET
Abs Immature Granulocytes: 0.03 10*3/uL (ref 0.00–0.07)
Basophils Absolute: 0.1 10*3/uL (ref 0.0–0.1)
Basophils Relative: 1 %
Eosinophils Absolute: 0.1 10*3/uL (ref 0.0–0.5)
Eosinophils Relative: 1 %
HCT: 30 % — ABNORMAL LOW (ref 36.0–46.0)
Hemoglobin: 9.3 g/dL — ABNORMAL LOW (ref 12.0–15.0)
Immature Granulocytes: 1 %
Lymphocytes Relative: 15 %
Lymphs Abs: 1 10*3/uL (ref 0.7–4.0)
MCH: 25.7 pg — ABNORMAL LOW (ref 26.0–34.0)
MCHC: 31 g/dL (ref 30.0–36.0)
MCV: 82.9 fL (ref 80.0–100.0)
Monocytes Absolute: 0.3 10*3/uL (ref 0.1–1.0)
Monocytes Relative: 4 %
Neutro Abs: 5.2 10*3/uL (ref 1.7–7.7)
Neutrophils Relative %: 78 %
Platelets: 332 10*3/uL (ref 150–400)
RBC: 3.62 MIL/uL — ABNORMAL LOW (ref 3.87–5.11)
RDW: 13.7 % (ref 11.5–15.5)
WBC: 6.6 10*3/uL (ref 4.0–10.5)
nRBC: 0 % (ref 0.0–0.2)

## 2019-07-30 LAB — BLOOD GAS, VENOUS
Acid-Base Excess: 7 mmol/L — ABNORMAL HIGH (ref 0.0–2.0)
Bicarbonate: 29.6 mmol/L — ABNORMAL HIGH (ref 20.0–28.0)
O2 Saturation: 97.2 %
Patient temperature: 98.6
pCO2, Ven: 33.4 mmHg — ABNORMAL LOW (ref 44.0–60.0)
pH, Ven: 7.555 — ABNORMAL HIGH (ref 7.250–7.430)
pO2, Ven: 83.8 mmHg — ABNORMAL HIGH (ref 32.0–45.0)

## 2019-07-30 LAB — COMPREHENSIVE METABOLIC PANEL
ALT: 15 U/L (ref 0–44)
AST: 23 U/L (ref 15–41)
Albumin: 2.7 g/dL — ABNORMAL LOW (ref 3.5–5.0)
Alkaline Phosphatase: 92 U/L (ref 38–126)
Anion gap: 14 (ref 5–15)
BUN: 20 mg/dL (ref 6–20)
CO2: 27 mmol/L (ref 22–32)
Calcium: 9.1 mg/dL (ref 8.9–10.3)
Chloride: 94 mmol/L — ABNORMAL LOW (ref 98–111)
Creatinine, Ser: 0.93 mg/dL (ref 0.44–1.00)
GFR calc Af Amer: >60 mL/min (ref >60)
GFR calc non Af Amer: >60 mL/min (ref >60)
Glucose, Bld: 170 mg/dL — ABNORMAL HIGH (ref 70–99)
Potassium: 3.9 mmol/L (ref 3.5–5.1)
Sodium: 135 mmol/L (ref 135–145)
Total Bilirubin: 0.8 mg/dL (ref 0.3–1.2)
Total Protein: 9.5 g/dL — ABNORMAL HIGH (ref 6.5–8.1)

## 2019-07-30 LAB — PREGNANCY, URINE: Preg Test, Ur: NEGATIVE

## 2019-07-30 MED ORDER — PROMETHAZINE HCL 12.5 MG PO TABS: 12.5000 mg | ORAL_TABLET | Freq: Three times a day (TID) | ORAL | 0 refills | Status: DC | PRN

## 2019-07-30 NOTE — ED Notes (Addendum)
IV line noted to be infiltrated, will restick.

## 2019-08-07 ENCOUNTER — Emergency Department (HOSPITAL_COMMUNITY)
Admission: EM | Admit: 2019-08-07 | Discharge: 2019-08-08 | Disposition: A | Discharge status: Home / Self Care | Payer: 59 | Attending: Emergency Medicine | Admitting: Emergency Medicine

## 2019-08-07 ENCOUNTER — Encounter (HOSPITAL_COMMUNITY): Payer: Self-pay

## 2019-08-07 ENCOUNTER — Other Ambulatory Visit: Payer: Self-pay

## 2019-08-07 DIAGNOSIS — Z5321 Procedure and treatment not carried out due to patient leaving prior to being seen by health care provider: Secondary | ICD-10-CM | POA: Insufficient documentation

## 2019-08-07 DIAGNOSIS — M549 Dorsalgia, unspecified: Secondary | ICD-10-CM | POA: Insufficient documentation

## 2019-08-07 DIAGNOSIS — R1110 Vomiting, unspecified: Secondary | ICD-10-CM | POA: Insufficient documentation

## 2019-08-07 NOTE — ED Triage Notes (Signed)
Pt reports severe back pain and 10 episodes of vomiting since this morning. Hx of same.

## 2019-08-08 NOTE — ED Notes (Signed)
Called 3X. Eloped from waiting room.

## 2019-08-09 ENCOUNTER — Other Ambulatory Visit: Payer: Self-pay

## 2019-08-09 ENCOUNTER — Emergency Department (HOSPITAL_COMMUNITY): Admission: EM | Admit: 2019-08-09 | Discharge: 2019-08-09 | Discharge status: Against Medical Advice | Payer: 59

## 2019-08-11 ENCOUNTER — Ambulatory Visit (INDEPENDENT_AMBULATORY_CARE_PROVIDER_SITE_OTHER): Payer: 59 | Admitting: Gastroenterology

## 2019-08-11 ENCOUNTER — Encounter: Payer: Self-pay | Admitting: Gastroenterology

## 2019-08-11 ENCOUNTER — Emergency Department (HOSPITAL_COMMUNITY)
Admission: EM | Admit: 2019-08-11 | Discharge: 2019-08-12 | Disposition: A | Discharge status: Home / Self Care | Payer: 59 | Attending: Emergency Medicine | Admitting: Emergency Medicine

## 2019-08-11 ENCOUNTER — Other Ambulatory Visit: Payer: Self-pay

## 2019-08-11 ENCOUNTER — Encounter (HOSPITAL_COMMUNITY): Payer: Self-pay | Admitting: Emergency Medicine

## 2019-08-11 VITALS — BP 188/92 | HR 98 | Temp 97.9°F | Ht 67.0 in | Wt 124.0 lb

## 2019-08-11 DIAGNOSIS — K3184 Gastroparesis: Secondary | ICD-10-CM

## 2019-08-11 DIAGNOSIS — E109 Type 1 diabetes mellitus without complications: Secondary | ICD-10-CM | POA: Insufficient documentation

## 2019-08-11 DIAGNOSIS — Z7901 Long term (current) use of anticoagulants: Secondary | ICD-10-CM | POA: Insufficient documentation

## 2019-08-11 DIAGNOSIS — R1013 Epigastric pain: Secondary | ICD-10-CM | POA: Diagnosis not present

## 2019-08-11 DIAGNOSIS — K529 Noninfective gastroenteritis and colitis, unspecified: Secondary | ICD-10-CM | POA: Diagnosis not present

## 2019-08-11 DIAGNOSIS — R1111 Vomiting without nausea: Secondary | ICD-10-CM | POA: Diagnosis present

## 2019-08-11 DIAGNOSIS — Z79899 Other long term (current) drug therapy: Secondary | ICD-10-CM | POA: Diagnosis not present

## 2019-08-11 DIAGNOSIS — R1115 Cyclical vomiting syndrome unrelated to migraine: Secondary | ICD-10-CM | POA: Diagnosis not present

## 2019-08-11 LAB — CBG MONITORING, ED: Glucose-Capillary: 164 mg/dL — ABNORMAL HIGH (ref 70–99)

## 2019-08-11 MED ORDER — METOCLOPRAMIDE HCL 5 MG PO TABS: 5.0000 mg | ORAL_TABLET | Freq: Two times a day (BID) | ORAL | 1 refills | Status: DC

## 2019-08-11 MED ORDER — DROPERIDOL 2.5 MG/ML IJ SOLN
2.5000 mg | Freq: Once | INTRAMUSCULAR | Status: AC
  Administered 2019-08-12: 2.5 mg via INTRAVENOUS
  Filled 2019-08-11: qty 2

## 2019-08-11 MED ORDER — PROMETHAZINE HCL 25 MG RE SUPP: 25.0000 mg | Freq: Two times a day (BID) | RECTAL | 1 refills | Status: DC

## 2019-08-11 MED ORDER — PROMETHAZINE HCL 12.5 MG PO TABS: 12.5000 mg | ORAL_TABLET | Freq: Three times a day (TID) | ORAL | 1 refills | Status: DC | PRN

## 2019-08-11 MED ORDER — SODIUM CHLORIDE 0.9 % IV BOLUS
1000.0000 mL | Freq: Once | INTRAVENOUS | Status: AC
  Administered 2019-08-12: 1000 mL via INTRAVENOUS

## 2019-08-11 NOTE — ED Triage Notes (Signed)
Pt report abd pain onset x1 week with accompanying N/V that has lead to back pain. Denies known COVID exposure denies preg last period March.

## 2019-08-11 NOTE — Patient Instructions (Addendum)
If you are age 30 or older, your body mass index should be between 23-30. Your Body mass index is 19.42 kg/m. If this is out of the aforementioned range listed, please consider follow up with your Primary Care Provider.  If you are age 57 or younger, your body mass index should be between 19-25. Your Body mass index is 19.42 kg/m. If this is out of the aformentioned range listed, please consider follow up with your Primary Care Provider.    You have been scheduled for a gastric emptying scan at [facility] on Tuesday, 08-30-19 at 7:30am. Please arrive at least 15 minutes prior to your appointment for registration. Please make certain not to have anything to eat or drink after midnight the night before your test. Hold all stomach medications (ex: Zofran, phenergan, Reglan and Bentyl) 8 hours prior to your test. If you need to reschedule your appointment, please contact radiology scheduling at [phone]. _____________________________________________________________________ A gastric-emptying study measures how long it takes for food to move through your stomach. There are several ways to measure stomach emptying. In the most common test, you eat food that contains a small amount of radioactive material. A scanner that detects the movement of the radioactive material is placed over your abdomen to monitor the rate at which food leaves your stomach. This test normally takes about 4 hours to complete. _____________________________________________________________________   We have sent the following medications to your pharmacy for you to pick up at your convenience: Reglan 5 mg: Take twice a day (do not take Reglan 10 mg tablets)  Phenergan 25 mg Suppositories: Take twice a day   Please follow up with [facility] regarding Gastric Pacemaker.   Thank you for entrusting me with your care and for choosing [facility], Dr. Ileene Patrick

## 2019-08-11 NOTE — ED Provider Notes (Signed)
[facility] DEPT Provider Note   CSN: [id number] Arrival date & time: 08/11/19  2105     History Chief Complaint  Patient presents with  . Abdominal Pain  . Back Pain    r/t emesis    [name] is a 29 y.o. female with PMHx type 1 Diabetes, chronic abdominal pain, HTN, cyclic vomiting syndrome, GERD, gastroparesis, cannabinoid hyperemesis syndrome who presents to the ED today with complaints of gradual onset, constant, epigastric abdominal pain x 1 week with associated N/V/D. Pt reports the diarrhea is her typical chronic diarrhea. She has been taking medications at home including bentyl, maalox, carafate, and phenergan without relief. She does mention seeing GI today with plan for gastric emptying study June 1 (see note below). Pt denies fevers, chills, blood in stool, hematochezia, urinary sx, or any other associated symptoms.   The history is provided by the patient and medical records.    GI visit today with Dr. Havery Moros ASSESSMENT AND PLAN: 29 year old female here for reassessment of the following:  Severe gastroparesis / type I DM- refractory gastroparesis leading to multiple admissions and has precipitated DKA.  She has had numerous ED visits in the past month. On Remeron, phenergen TID, and protonix BID.  On and off Reglan over the years, this can provide some benefit but does worsen her diarrhea.  She has not been a candidate for domperidone given prolonged QT in the past.  I referred her to [facility] previously for gastric pacemaker and they thought she was a good candidate for this but needed a current gastric emptying study to confirm findings prior to proceeding.  Unfortunately due to the Covid pandemic she was not able to follow through with that, now with been over a year since her last visit.  Discussed options with her.  This is a very difficult situation and I do think gastric pacemaker is probably her best option.  Recommend that we repeat  a gastric emptying study to confirm gastroparesis and will have her follow-up with [facility] to see if they can proceed with gastric pacemaker there.  I will refill her Phenergan, she will continue the Remeron as she does find that it helps her appetite.  She is asking to resume Reglan and to keep her out of the ED.  We will use a very low dose, 5 mg twice daily given reports of this that has some potential to prolong QT although evidence behind this is not good and overall thought to be low risk especially at low doses.  She states she is been on it for years and feels that benefits outweigh risks, she understands risks to include tardive dyskinesia, arrhythmia etc..  I will give her a short course of this.  Chronic diarrhea - not a candidate for prucalopride due to this. Colonoscopy without abnormalities and biopsies normal. Infectious workup negative. Have ordered fecal elastase a few times, this remains pending, otherwise continue immodium.    Past Medical History:  Diagnosis Date  . Allergy   . Anemia   . Anxiety   . Blood transfusion without reported diagnosis    Dec 2018  . Cataract    right eye  . COVID-19   . Depression   . Diabetes type 1, uncontrolled ([place]) 11/14/2011   Since age 40  . Fibromyalgia   . Gastroparesis   . GERD (gastroesophageal reflux disease)   . Hypertension   . Infection    UTI April 2016    Patient Active  Problem List   Diagnosis Date Noted  . Nausea & vomiting 04/30/2019  . Hypertensive urgency 04/30/2019  . Cannabinoid hyperemesis syndrome 04/30/2019  . Contraception management 08/06/2018  . Cyclical vomiting syndrome 06/07/2018  . Diabetic gastroparesis associated with type 1 diabetes mellitus ([place]) 06/04/2018  . Anxiety 05/31/2018  . Peripheral edema 05/31/2018  . Normocytic anemia 07/10/2017  . GERD (gastroesophageal reflux disease) 06/23/2017  . Intractable nausea and vomiting 04/11/2017  . MDD (major depressive disorder), recurrent  episode, mild ([place]) 02/28/2017  . Diabetes mellitus type 1 ([place]) 04/28/2013  . Protein-calorie malnutrition, severe ([place]) 04/28/2013  . Hypertension associated with diabetes ([place]) 11/14/2011  . Chronic abdominal pain 09/18/2011    Past Surgical History:  Procedure Laterality Date  . ANKLE SURGERY    . CHOLECYSTECTOMY  11/15/2011   Procedure: LAPAROSCOPIC CHOLECYSTECTOMY WITH INTRAOPERATIVE CHOLANGIOGRAM;  Surgeon: [name], MD;  Location: [facility];  Service: General;  Laterality: N/A;  . COLONOSCOPY    . COLONOSCOPY WITH PROPOFOL N/A 06/27/2017   Procedure: COLONOSCOPY WITH PROPOFOL;  Surgeon: Milus Banister, MD;  Location: [facility] END[facility];  Service: Endoscopy;  Laterality: N/A;  . ESOPHAGOGASTRODUODENOSCOPY  12/03/2011   Procedure: ESOPHAGOGASTRODUODENOSCOPY (EGD);  Surgeon: Beryle Beams, MD;  Location: [facility];  Service: Endoscopy;  Laterality: N/A;  . FLEXIBLE SIGMOIDOSCOPY N/A 03/10/2017   Procedure: FLEXIBLE SIGMOIDOSCOPY;  Surgeon: Carol Ada, MD;  Location: [facility];  Service: Endoscopy;  Laterality: N/A;  . INCISION AND DRAINAGE PERIRECTAL ABSCESS N/A 03/01/2017   Procedure: IRRIGATION AND DEBRIDEMENT PERIRECTAL ABSCESS;  Surgeon: Alphonsa Overall, MD;  Location: [facility];  Service: General;  Laterality: N/A;  . IRRIGATION AND DEBRIDEMENT BUTTOCKS N/A 03/23/2017   Procedure: IRRIGATION AND DEBRIDEMENT BUTTOCKS, SETON PLACEMENT;  Surgeon: [name], MD;  Location: [facility];  Service: General;  Laterality: N/A;  . LAPAROSCOPY  11/23/2011   Procedure: LAPAROSCOPY DIAGNOSTIC;  Surgeon: Edward Jolly, MD;  Location: [facility];  Service: General;  Laterality: N/A;  . SIGMOIDOSCOPY    . UPPER GASTROINTESTINAL ENDOSCOPY    . WISDOM TOOTH EXTRACTION       OB History    Gravida  1   Para  1   Term      Preterm  1   AB      Living  1     SAB      TAB      Ectopic      Multiple  0   Live Births  1           Family History  Problem Relation Age of  Onset  . Diabetes Mother   . Hypertension Father   . Colon cancer Paternal Grandmother        pt thinks PGM was dx in her 42's  . Diabetes Paternal Grandmother   . Diabetes Maternal Grandmother   . Diabetes Maternal Grandfather   . Diabetes Paternal Grandfather   . Diabetes Other   . Esophageal cancer Neg Hx   . Liver cancer Neg Hx   . Pancreatic cancer Neg Hx   . Stomach cancer Neg Hx   . Rectal cancer Neg Hx     Social History   Tobacco Use  . Smoking status: Never Smoker  . Smokeless tobacco: Never Used  Substance Use Topics  . Alcohol use: No    Alcohol/week: 0.0 standard drinks  . Drug use: Yes    Types: Marijuana    Home Medications Prior to Admission medications   Medication Sig  Start Date End Date Taking? Authorizing Provider  aluminum-magnesium hydroxide-simethicone (MAALOX) 324-401-02 MG/5ML SUSP Take 30 mLs by mouth 3 (three) times daily as needed (indigestion).    [provider]  Continuous Blood Gluc Receiver ([place]) [place] See admin instructions. USE AS DIRECTED TO MONITOR GLUCOSE LEVELS 10/29/18   [provider]  Continuous Blood Gluc Sensor (DEXCOM G6 SENSOR) MISC 1 each by Does not apply route See admin instructions. every 10 days. 12/31/18   Renato Shin, MD  Continuous Blood Gluc Transmit (DEXCOM G6 TRANSMITTER) MISC See admin instructions. USE AS DIRECTED TO MONITOR GLUCOSE LEVELS 10/29/18   [provider]  cyclobenzaprine (FLEXERIL) 10 MG tablet Take 1 tablet (10 mg total) by mouth 3 (three) times daily as needed for muscle spasms. 06/07/19   [name], MD  dicyclomine (BENTYL) 20 MG tablet Take 1 tablet (20 mg total) by mouth 3 (three) times daily as needed for spasms. 05/01/19   [name], MD  furosemide (LASIX) 40 MG tablet TAKE 1 TABLET BY MOUTH  DAILY Patient taking differently: Take 40 mg by mouth daily.  11/11/18   Armbruster, Carlota Raspberry, MD  hydrALAZINE (APRESOLINE) 50 MG tablet Take 1 tablet (50 mg  total) by mouth every 6 (six) hours. Patient taking differently: Take 50 mg by mouth 2 (two) times daily.  10/23/18   Swayze, Ava, DO  Insulin Glargine (LANTUS SOLOSTAR) 100 UNIT/ML Solostar Pen Inject 10 Units into the skin at bedtime. And pen needles 4/day 12/31/18   Renato Shin, MD  insulin lispro (HUMALOG KWIKPEN) 100 UNIT/ML KwikPen Inject 0.06 mLs (6 Units total) into the skin 3 (three) times daily. Patient taking differently: Inject 1-9 Units into the skin 3 (three) times daily. Per sliding scale 12/31/18   Renato Shin, MD  loperamide (IMODIUM) 2 MG capsule Take 6 mg by mouth as needed for diarrhea or loose stools.    [provider]  LORazepam (ATIVAN) 0.5 MG tablet Take 1 tablet (0.5 mg total) by mouth 2 (two) times daily as needed for anxiety. 06/30/19   [name], MD  metoCLOPramide (REGLAN) 5 MG tablet Take 1 tablet (5 mg total) by mouth in the morning and at bedtime. 08/11/19   Armbruster, Carlota Raspberry, MD  metoprolol tartrate (LOPRESSOR) 25 MG tablet Take 1 tablet (25 mg total) by mouth 2 (two) times daily. 05/22/19 07/30/19  Arrien, Jimmy Picket, MD  mirtazapine (REMERON) 30 MG tablet TAKE 1 TABLET BY MOUTH AT  BEDTIME Patient taking differently: Take 30 mg by mouth at bedtime.  04/19/19   [name], MD  norethindrone (ORTHO MICRONOR) 0.35 MG tablet Take 1 tablet (0.35 mg total) by mouth daily. 06/30/19   [name], MD  OVER THE COUNTER MEDICATION Take 2 each by mouth daily. Flo Chewable Gummies for period    [provider]  pantoprazole (PROTONIX) 40 MG tablet TAKE 1 TABLET BY MOUTH  DAILY Patient taking differently: Take 40 mg by mouth daily.  10/22/18   Armbruster, Carlota Raspberry, MD  potassium chloride (KLOR-CON) 10 MEQ tablet Take 1 tablet (10 mEq total) by mouth daily for 5 days. 08/12/19 08/17/19  Eustaquio Maize, PA-C  promethazine (PHENERGAN) 12.5 MG tablet Take 1 tablet (12.5 mg total) by mouth every 8 (eight) hours as needed for nausea or vomiting. 08/11/19    Armbruster, Carlota Raspberry, MD  promethazine (PHENERGAN) 25 MG suppository Place 1 suppository (25 mg total) rectally in the morning and at bedtime. DO NOT take oral phenergan  and suppositories at the same time. 08/11/19   Armbruster, Carlota Raspberry, MD  sucralfate (CARAFATE) 1 GM/10ML suspension Take 10 mLs (1 g total) by mouth 4 (four) times daily -  with meals and at bedtime. 06/23/19   Palumbo, April, MD  potassium chloride SA (KLOR-CON) 20 MEQ tablet Take 1 tablet (20 mEq total) by mouth daily for 5 doses. 07/04/19 07/30/19  Janeece Fitting, PA-C    Allergies    Other and Lactose intolerance (gi)  Review of Systems   Review of Systems  Constitutional: Negative for chills and fever.  Respiratory: Negative for shortness of breath.   Cardiovascular: Negative for chest pain.  Gastrointestinal: Positive for abdominal pain, diarrhea, nausea and vomiting. Negative for blood in stool and constipation.  All other systems reviewed and are negative.   Physical Exam Updated Vital Signs BP (!) 191/126 (BP Location: Left Arm)   Pulse (!) 103   Temp 98.2 F (36.8 C) (Oral)   Resp 20   LMP 06/11/2019   SpO2 98%   Physical Exam Vitals and nursing note reviewed.  Constitutional:      Appearance: She is not ill-appearing or diaphoretic.  HENT:     Head: Normocephalic and atraumatic.  Eyes:     Conjunctiva/sclera: Conjunctivae normal.  Cardiovascular:     Rate and Rhythm: Normal rate and regular rhythm.     Heart sounds: Normal heart sounds.  Pulmonary:     Effort: Pulmonary effort is normal.     Breath sounds: Normal breath sounds. No wheezing, rhonchi or rales.  Abdominal:     Palpations: Abdomen is soft.     Tenderness: There is abdominal tenderness in the epigastric area. There is no guarding or rebound.  Musculoskeletal:     Cervical back: Neck supple.  Skin:    General: Skin is warm and dry.  Neurological:     Mental Status: She is alert.     ED Results / Procedures / Treatments    Labs (all labs ordered are listed, but only abnormal results are displayed) Labs Reviewed  COMPREHENSIVE METABOLIC PANEL - Abnormal; Notable for the following components:      Result Value   Potassium 2.7 (*)    Chloride 93 (*)    Glucose, Bld 172 (*)    BUN 21 (*)    Calcium 8.3 (*)    Albumin 2.6 (*)    All other components within normal limits  CBC WITH DIFFERENTIAL/PLATELET - Abnormal; Notable for the following components:   WBC 3.9 (*)    RBC 3.84 (*)    Hemoglobin 9.6 (*)    HCT 31.5 (*)    MCH 25.0 (*)    All other components within normal limits  URINALYSIS, ROUTINE W REFLEX MICROSCOPIC - Abnormal; Notable for the following components:   Glucose, UA 50 (*)    Hgb urine dipstick SMALL (*)    Ketones, ur 20 (*)    Protein, ur >=300 (*)    RBC / HPF >50 (*)    Bacteria, UA RARE (*)    All other components within normal limits  CBG MONITORING, ED - Abnormal; Notable for the following components:   Glucose-Capillary 164 (*)    All other components within normal limits  URINE CULTURE  URINE CULTURE  LIPASE, BLOOD  PREGNANCY, URINE    EKG EKG Interpretation  Date/Time:  Thursday Aug 11 2019 23:25:48 EDT Ventricular Rate:  104 PR Interval:    QRS Duration: 84 QT Interval:  348 QTC  Calculation: 458 R Axis:   69 Text Interpretation: Sinus tachycardia Consider left atrial enlargement Confirmed by Molpus, John [phone]) on 08/12/2019 12:20:54 AM   Radiology No results found.  Procedures Procedures (including critical care time)  Medications Ordered in ED Medications  sodium chloride flush (NS) 0.9 % injection 10-40 mL (has no administration in time range)  sodium chloride 0.9 % bolus 1,000 mL (1,000 mLs Intravenous New Bag/Given 08/12/19 0110)  droperidol (INAPSINE) 2.5 MG/ML injection 2.5 mg (2.5 mg Intravenous Given 08/12/19 0106)  potassium chloride 10 mEq in 100 mL IVPB (0 mEq Intravenous Stopped 08/12/19 0213)    ED Course  I have reviewed the triage vital  signs and the nursing notes.  Pertinent labs & imaging results that were available during my care of the patient were reviewed by me and considered in my medical decision making (see chart for details).  Clinical Course as of Aug 12 239  Thu Aug 11, 2019  2314 Potassium(!!): 2.7 [MV]    Clinical Course User Index [MV] Eustaquio Maize, [place]   MDM Rules/Calculators/A&P                      29 year old female with a past medical history of cyclical vomiting syndrome who presents to the ED today complaining of [place] pain, nausea, vomiting, diarrhea.  Unable to control at home with medications.  Please see above, saw GI today.  Plan for gastric emptying study in the near future.  On arrival to the ED patient is afebrile nontachypneic.  She is tachycardic in the low 100s which is typical for her.  Blood pressure is elevated 191/126 which is also typical for her.  Patient has some epigastric abdominal tenderness on exam however no peritoneal signs.  Doubt acute abdomen at this time.  Will obtain EKG to assess for QT prolongation, it does appear that droperidol works well for patient in the past.  If no prolongation will provide droperidol and IV fluids.  Will work-up for abdominal pain at this time.   EKG reassuring. Droperidol ordered.   CBC without leukocytosis. Hgb stable.  CMP with potassium 2.7. Will replete in the ED. Creatinine stable 0.95. BUN mildly elevated 21. No gap.  Lipase 18.  U/A with > 50 RBCs, 6-10 WBCs, and rare bacteria. No leuks or nitrites. Pt without urinary symptoms. Will send for culture but hold off on treatment at this time.   On reassessment pt reports improvement in symptoms. She is no longer tachy. Will fluid challenge at this time.   Pt able to tolerate fluids without difficulty. She reports she is ready to go home. Will discharge at this time with close GI follow up. Discharged with Rx [facility]. Advised to have potassium rechecked in 1 week. Strict return  precautions discussed. Pt is in agreement with plan and stable for discharge home.   This note was prepared using Dragon voice recognition software and may include unintentional dictation errors due to the inherent limitations of voice recognition software.  Final Clinical Impression(s) / ED Diagnoses Final diagnoses:  Cyclic vomiting syndrome    Rx / DC Orders ED Discharge Orders         Ordered    potassium chloride (KLOR-CON) 10 MEQ tablet  Daily     08/12/19 0237           Discharge Instructions     Your potassium was low today (2.7). We gave you some potassium through the IV. I have sent you home  with a prescription for oral potassium as well. Please have your potassium level rechecked in 1 weeks time.  Please continue taking your home meds as indicated. Follow up with your gastroenterologist as indicated. Keep appointment for your gastric emptying study.  Return to the ED for any worsening symptoms We have sent your urine for culture and will call in 2-3 days if it grow bacteria that needs to be covered with antibiotics. Given you do not have any urinary symptoms currently we will hold off on treatment.        Eustaquio Maize, PA-C 08/12/19 0241    Shanon Rosser, MD 08/12/19 [id number]

## 2019-08-11 NOTE — Progress Notes (Signed)
HPI :  29 year old female here for a follow-up visit for gastroparesis.  She has longstanding type 1 diabetes and has had prior abnormal gastric emptying studies, last done over 3 years ago, and normal EGDs.  She has had CT scans without any concerning findings.  Historically she has not had a lot of benefit from Reglan.  Her QTC has fluctuated and sometimes prolonged which has prohibited the use of domperidone.  She has had chronic diarrhea so we have not use prucalopride.  More recently she has been maintained on Remeron 30 mg nightly, Phenergan 12 and half milligrams every 8 hours as needed p.o., and Protonix, as well as Bentyl.  If the symptoms are severe she cannot tolerate p.o. too well she uses a Phenergan suppository.  I have not seen her in over a year.  At that time I had previously referred her to [facility] for consideration of gastric pacemaker.  She saw them last March, she was supposed to have a follow-up gastric emptying study with them and then proceed with gastric pacemaker if the gastric emptying study confirmed ongoing problems with gastroparesis.  Unfortunately due to the Covid pandemic she has not been able to follow-up with them.  She states along the way at some point over the past year she had resumed the Reglan and states it does provide some benefit for her.  She is ran out of it about 2 to 3 months ago.  Her symptoms have persisted and she is bothered daily by her stomach.  She has chronic nausea all the time.  She vomits usually at least once in the morning and sometimes more later in the day.  She does not sleep well.  Weight has fluctuated but has been relatively stable.  She has chronic loose stools for which she uses Imodium daily.  She is had prior colonoscopy with negative biopsies.  Prior CT scans have shown some evidence of colitis but this has not been seen on colonoscopy.  She has had empiric trials of cholestyramine and Creon in the past, none of which have really  helped.  We have previously ordered pancreatic fecal elastase but she has not been able submit the sample.  Reglan has made diarrhea worse in the past as well.  Her EKGs in the past several months have mostly shown a QTC of 440, most recently it was 460.  This is fluctuated over time.  She has not been using Reglan again in recent months.  She has had numerous visits to the ED for IV fluids and hydration in April.  She is asking to resume Reglan because she thinks it keeps her out of the emergency room.   Prior evaluation: Abnormal GES in the past c/w gastroparesis Colonoscopy 06/27/2017 - normal colon and ileum- normal random biopsies Flex sig 03/10/2017 - poor prep, normal colon with normal biopsies EGD 12/03/2011 - not completed due to retained food EGD 10/05/2017 - retained gastric fluid which limited some views of body / fundus, otherwise normal, no outlet obstruction   CT scan 03/23/2018 - distended stomach, mild transmural thickening of small and large bowel, mild hepatic steatosis CT angio scan 12/23/2017 - mild wall thickening of transverse colon, marked distension of bladder, body wall and mesenteric edema  CT renal stone protocol 05/31/19 - IMPRESSION: 1. Findings concerning for mild colitis. Clinical correlation is recommended. No bowel obstruction. Normal appendix. 2. No hydronephrosis or nephrolithiasis.   Severe gastroparesis / type I DM / GERD - refractory  gastroparesis leading to multiple admissions and has precipitated DKA. On Remeron, phenergen TID, and protonix BID. Reglan does not seem to help and worsens diarrhea, had not been a candidate for domperidone given prolonged QT in the past. Seen by [facility] recently for assessment for her candidacy for gastric pacemaker, they feel she is a good candidate if GES shows gastroparesis which it has in the past, repeat exam pending in a few weeks. Continue regimen for now. I asked her to go to the lab for CMET - want to make sure renal  function / electrolytes are stable on lasix and that albumin is improved. Will wean down lasix if possible. She agreed. Continue regimen for now.   Chronic diarrhea - not a candidate for prucalopride due to this. Colonoscopy without abnormalities and biopsies normal. Infectious workup negative. Have ordered fecal elastase a few times, this remains pending, will ask her to submit if she can, and check TTg, IgA, otherwise continue immodium.    Past Medical History:  Diagnosis Date  . Allergy   . Anemia   . Anxiety   . Blood transfusion without reported diagnosis    Dec 2018  . Cataract    right eye  . COVID-19   . Depression   . Diabetes type 1, uncontrolled ([place]) 11/14/2011   Since age 23  . Fibromyalgia   . Gastroparesis   . GERD (gastroesophageal reflux disease)   . Hypertension   . Infection    UTI April 2016     Past Surgical History:  Procedure Laterality Date  . ANKLE SURGERY    . CHOLECYSTECTOMY  11/15/2011   Procedure: LAPAROSCOPIC CHOLECYSTECTOMY WITH INTRAOPERATIVE CHOLANGIOGRAM;  Surgeon: [name], MD;  Location: [facility];  Service: General;  Laterality: N/A;  . COLONOSCOPY    . COLONOSCOPY WITH PROPOFOL N/A 06/27/2017   Procedure: COLONOSCOPY WITH PROPOFOL;  Surgeon: Milus Banister, MD;  Location: [facility];  Service: Endoscopy;  Laterality: N/A;  . ESOPHAGOGASTRODUODENOSCOPY  12/03/2011   Procedure: ESOPHAGOGASTRODUODENOSCOPY (EGD);  Surgeon: Beryle Beams, MD;  Location: [facility];  Service: Endoscopy;  Laterality: N/A;  . FLEXIBLE SIGMOIDOSCOPY N/A 03/10/2017   Procedure: FLEXIBLE SIGMOIDOSCOPY;  Surgeon: Carol Ada, MD;  Location: [facility];  Service: Endoscopy;  Laterality: N/A;  . INCISION AND DRAINAGE PERIRECTAL ABSCESS N/A 03/01/2017   Procedure: IRRIGATION AND DEBRIDEMENT PERIRECTAL ABSCESS;  Surgeon: Alphonsa Overall, MD;  Location: [facility];  Service: General;  Laterality: N/A;  . IRRIGATION AND DEBRIDEMENT BUTTOCKS N/A 03/23/2017    Procedure: IRRIGATION AND DEBRIDEMENT BUTTOCKS, SETON PLACEMENT;  Surgeon: [name], MD;  Location: [facility];  Service: General;  Laterality: N/A;  . LAPAROSCOPY  11/23/2011   Procedure: LAPAROSCOPY DIAGNOSTIC;  Surgeon: Edward Jolly, MD;  Location: [facility];  Service: General;  Laterality: N/A;  . SIGMOIDOSCOPY    . UPPER GASTROINTESTINAL ENDOSCOPY    . WISDOM TOOTH EXTRACTION     Family History  Problem Relation Age of Onset  . Diabetes Mother   . Hypertension Father   . Colon cancer Paternal Grandmother        pt thinks PGM was dx in her 24's  . Diabetes Paternal Grandmother   . Diabetes Maternal Grandmother   . Diabetes Maternal Grandfather   . Diabetes Paternal Grandfather   . Diabetes Other   . Esophageal cancer Neg Hx   . Liver cancer Neg Hx   . Pancreatic cancer Neg Hx   . Stomach cancer Neg Hx   . Rectal cancer  Neg Hx    Social History   Tobacco Use  . Smoking status: Never Smoker  . Smokeless tobacco: Never Used  Substance Use Topics  . Alcohol use: No    Alcohol/week: 0.0 standard drinks  . Drug use: Yes    Types: Marijuana   Current Outpatient Medications  Medication Sig Dispense Refill  . aluminum-magnesium hydroxide-simethicone (MAALOX) [phone] MG/5ML SUSP Take 30 mLs by mouth 3 (three) times daily as needed (indigestion).    . Continuous Blood Gluc Receiver (DEXCOM G6 RECEIVER) [place] See admin instructions. USE AS DIRECTED TO MONITOR GLUCOSE LEVELS    . Continuous Blood Gluc Sensor (DEXCOM G6 SENSOR) MISC 1 each by Does not apply route See admin instructions. every 10 days. 9 each 3  . Continuous Blood Gluc Transmit (DEXCOM G6 TRANSMITTER) MISC See admin instructions. USE AS DIRECTED TO MONITOR GLUCOSE LEVELS    . cyclobenzaprine (FLEXERIL) 10 MG tablet Take 1 tablet (10 mg total) by mouth 3 (three) times daily as needed for muscle spasms. 30 tablet 1  . dicyclomine (BENTYL) 20 MG tablet Take 1 tablet (20 mg total) by mouth 3 (three) times daily as  needed for spasms. 90 tablet 1  . furosemide (LASIX) 40 MG tablet TAKE 1 TABLET BY MOUTH  DAILY (Patient taking differently: Take 40 mg by mouth daily. ) 90 tablet 3  . hydrALAZINE (APRESOLINE) 50 MG tablet Take 1 tablet (50 mg total) by mouth every 6 (six) hours. (Patient taking differently: Take 50 mg by mouth 2 (two) times daily. ) 120 tablet 0  . Insulin Glargine (LANTUS SOLOSTAR) 100 UNIT/ML Solostar Pen Inject 10 Units into the skin at bedtime. And pen needles 4/day 5 pen PRN  . insulin lispro (HUMALOG KWIKPEN) 100 UNIT/ML KwikPen Inject 0.06 mLs (6 Units total) into the skin 3 (three) times daily. (Patient taking differently: Inject 1-9 Units into the skin 3 (three) times daily. Per sliding scale) 5 pen 11  . loperamide (IMODIUM) 2 MG capsule Take 6 mg by mouth as needed for diarrhea or loose stools.    Marland Kitchen LORazepam (ATIVAN) 0.5 MG tablet Take 1 tablet (0.5 mg total) by mouth 2 (two) times daily as needed for anxiety. 60 tablet 5  . metoCLOPramide (REGLAN) 10 MG tablet Take 1 tablet (10 mg total) by mouth every 8 (eight) hours as needed for nausea (nausea/headache). 6 tablet 0  . mirtazapine (REMERON) 30 MG tablet TAKE 1 TABLET BY MOUTH AT  BEDTIME (Patient taking differently: Take 30 mg by mouth at bedtime. ) 30 tablet 11  . norethindrone (ORTHO MICRONOR) 0.35 MG tablet Take 1 tablet (0.35 mg total) by mouth daily. 1 Package 11  . OVER THE COUNTER MEDICATION Take 2 each by mouth daily. Flo Chewable Gummies for period    . pantoprazole (PROTONIX) 40 MG tablet TAKE 1 TABLET BY MOUTH  DAILY (Patient taking differently: Take 40 mg by mouth daily. ) 90 tablet 3  . promethazine (PHENERGAN) 12.5 MG tablet Take 1 tablet (12.5 mg total) by mouth every 8 (eight) hours as needed for nausea or vomiting. 15 tablet 0  . promethazine (PHENERGAN) 25 MG suppository Place 1 suppository (25 mg total) rectally every 8 (eight) hours as needed for nausea or vomiting. Please schedule and keep an OV for ANY further  refills.  DO NOT take oral phenergan and suppositories at the same time. 15 each 0  . sucralfate (CARAFATE) 1 GM/10ML suspension Take 10 mLs (1 g total) by mouth 4 (four) times daily -  with meals and at bedtime. 420 mL 0  . metoprolol tartrate (LOPRESSOR) 25 MG tablet Take 1 tablet (25 mg total) by mouth 2 (two) times daily. 60 tablet 0   No current facility-administered medications for this visit.   Allergies  Allergen Reactions  . Other Anaphylaxis    Reaction to [place] nuts   . Lactose Intolerance (Gi) Diarrhea     Review of Systems: All systems reviewed and negative except where noted in HPI.    No results found.  Physical Exam: BP (!) 188/92   Pulse 98   Temp 97.9 F (36.6 C)   Ht 5' 7"  (1.702 m)   Wt 124 lb (56.2 kg)   BMI 19.42 kg/m  Constitutional: Pleasant female in no acute distress. HEENT: Normocephalic and atraumatic. Conjunctivae are normal. No scleral icterus. Neck supple.  Cardiovascular: Normal rate, regular rhythm.  Pulmonary/chest: Effort normal and breath sounds normal. No wheezing, rales or rhonchi. Abdominal: Soft, nondistended, nontender. There are no masses palpable. No hepatomegaly. Extremities: no edema Lymphadenopathy: No cervical adenopathy noted. Neurological: Alert and oriented to person place and time. Skin: Skin is warm and dry. No rashes noted. Psychiatric: Normal mood and affect. Behavior is normal.   ASSESSMENT AND PLAN: 29 year old female here for reassessment of the following:  Severe gastroparesis / type I DM - refractory gastroparesis leading to multiple admissions and has precipitated DKA.  She has had numerous ED visits in the past month. On Remeron, phenergen TID, and protonix BID.  On and off Reglan over the years, this can provide some benefit but does worsen her diarrhea.  She has not been a candidate for domperidone given prolonged QT in the past.  I referred her to [facility] previously for gastric pacemaker and they thought she  was a good candidate for this but needed a current gastric emptying study to confirm findings prior to proceeding.  Unfortunately due to the Covid pandemic she was not able to follow through with that, now with been over a year since her last visit.  Discussed options with her.  This is a very difficult situation and I do think gastric pacemaker is probably her best option.  Recommend that we repeat a gastric emptying study to confirm gastroparesis and will have her follow-up with [facility] to see if they can proceed with gastric pacemaker there.  I will refill her Phenergan, she will continue the Remeron as she does find that it helps her appetite.  She is asking to resume Reglan and to keep her out of the ED.  We will use a very low dose, 5 mg twice daily given reports of this that has some potential to prolong QT although evidence behind this is not good and overall thought to be low risk especially at low doses.  She states she is been on it for years and feels that benefits outweigh risks, she understands risks to include tardive dyskinesia, arrhythmia etc..  I will give her a short course of this.  Chronic diarrhea - not a candidate for prucalopride due to this. Colonoscopy without abnormalities and biopsies normal. Infectious workup negative. Have ordered fecal elastase a few times, this remains pending, otherwise continue immodium.  Sherwood Cellar, MD [facility]

## 2019-08-12 LAB — URINALYSIS, ROUTINE W REFLEX MICROSCOPIC
Bilirubin Urine: NEGATIVE
Glucose, UA: 50 mg/dL — AB
Ketones, ur: 20 mg/dL — AB
Leukocytes,Ua: NEGATIVE
Nitrite: NEGATIVE
Protein, ur: >=300 mg/dL — AB
RBC / HPF: >50 RBC/hpf — ABNORMAL HIGH (ref 0–5)
Specific Gravity, Urine: 1.019 (ref 1.005–1.030)
pH: 6 (ref 5.0–8.0)

## 2019-08-12 LAB — COMPREHENSIVE METABOLIC PANEL
ALT: 18 U/L (ref 0–44)
AST: 21 U/L (ref 15–41)
Albumin: 2.6 g/dL — ABNORMAL LOW (ref 3.5–5.0)
Alkaline Phosphatase: 60 U/L (ref 38–126)
Anion gap: 15 (ref 5–15)
BUN: 21 mg/dL — ABNORMAL HIGH (ref 6–20)
CO2: 27 mmol/L (ref 22–32)
Calcium: 8.3 mg/dL — ABNORMAL LOW (ref 8.9–10.3)
Chloride: 93 mmol/L — ABNORMAL LOW (ref 98–111)
Creatinine, Ser: 0.95 mg/dL (ref 0.44–1.00)
GFR calc Af Amer: >60 mL/min (ref >60)
GFR calc non Af Amer: >60 mL/min (ref >60)
Glucose, Bld: 172 mg/dL — ABNORMAL HIGH (ref 70–99)
Potassium: 2.7 mmol/L — CL (ref 3.5–5.1)
Sodium: 135 mmol/L (ref 135–145)
Total Bilirubin: 0.9 mg/dL (ref 0.3–1.2)
Total Protein: 7.2 g/dL (ref 6.5–8.1)

## 2019-08-12 LAB — CBC WITH DIFFERENTIAL/PLATELET
Abs Immature Granulocytes: 0.01 10*3/uL (ref 0.00–0.07)
Basophils Absolute: 0 10*3/uL (ref 0.0–0.1)
Basophils Relative: 1 %
Eosinophils Absolute: 0.1 10*3/uL (ref 0.0–0.5)
Eosinophils Relative: 2 %
HCT: 31.5 % — ABNORMAL LOW (ref 36.0–46.0)
Hemoglobin: 9.6 g/dL — ABNORMAL LOW (ref 12.0–15.0)
Immature Granulocytes: 0 %
Lymphocytes Relative: 37 %
Lymphs Abs: 1.5 10*3/uL (ref 0.7–4.0)
MCH: 25 pg — ABNORMAL LOW (ref 26.0–34.0)
MCHC: 30.5 g/dL (ref 30.0–36.0)
MCV: 82 fL (ref 80.0–100.0)
Monocytes Absolute: 0.3 10*3/uL (ref 0.1–1.0)
Monocytes Relative: 7 %
Neutro Abs: 2.1 10*3/uL (ref 1.7–7.7)
Neutrophils Relative %: 53 %
Platelets: 229 10*3/uL (ref 150–400)
RBC: 3.84 MIL/uL — ABNORMAL LOW (ref 3.87–5.11)
RDW: 13.5 % (ref 11.5–15.5)
WBC: 3.9 10*3/uL — ABNORMAL LOW (ref 4.0–10.5)
nRBC: 0 % (ref 0.0–0.2)

## 2019-08-12 LAB — PREGNANCY, URINE: Preg Test, Ur: NEGATIVE

## 2019-08-12 LAB — LIPASE, BLOOD: Lipase: 18 U/L (ref 11–51)

## 2019-08-12 MED ORDER — POTASSIUM CHLORIDE 10 MEQ/100ML IV SOLN
10.0000 meq | Freq: Once | INTRAVENOUS | Status: AC
  Administered 2019-08-12: 10 meq via INTRAVENOUS
  Filled 2019-08-12: qty 100

## 2019-08-12 MED ORDER — POTASSIUM CHLORIDE ER 10 MEQ PO TBCR
10.0000 meq | EXTENDED_RELEASE_TABLET | Freq: Every day | ORAL | 0 refills | Status: DC
Start: 2019-08-12 — End: 2019-09-30

## 2019-08-12 MED ORDER — SODIUM CHLORIDE 0.9% FLUSH: 10.0000 mL | INTRAVENOUS | Status: DC | PRN

## 2019-08-12 NOTE — Discharge Instructions (Signed)
Your potassium was low today (2.7). We gave you some potassium through the IV. I have sent you home with a prescription for oral potassium as well. Please have your potassium level rechecked in 1 weeks time.  Please continue taking your home meds as indicated. Follow up with your gastroenterologist as indicated. Keep appointment for your gastric emptying study.  Return to the ED for any worsening symptoms We have sent your urine for culture and will call in 2-3 days if it grow bacteria that needs to be covered with antibiotics. Given you do not have any urinary symptoms currently we will hold off on treatment.

## 2019-08-12 NOTE — ED Notes (Signed)
Pt able to tolerate PO liquid without N/V

## 2019-08-12 NOTE — ED Notes (Addendum)
Pt given water at this time for PO/fluid challenge.

## 2019-08-13 LAB — URINE CULTURE: Culture: 1000 — AB

## 2019-08-25 ENCOUNTER — Emergency Department (HOSPITAL_COMMUNITY)
Admission: EM | Admit: 2019-08-25 | Discharge: 2019-08-25 | Disposition: A | Discharge status: Home / Self Care | Payer: 59 | Attending: Emergency Medicine | Admitting: Emergency Medicine

## 2019-08-25 ENCOUNTER — Other Ambulatory Visit: Payer: Self-pay

## 2019-08-25 ENCOUNTER — Encounter (HOSPITAL_COMMUNITY): Payer: Self-pay

## 2019-08-25 DIAGNOSIS — Z5321 Procedure and treatment not carried out due to patient leaving prior to being seen by health care provider: Secondary | ICD-10-CM | POA: Diagnosis not present

## 2019-08-25 DIAGNOSIS — R112 Nausea with vomiting, unspecified: Secondary | ICD-10-CM | POA: Insufficient documentation

## 2019-08-25 NOTE — ED Notes (Signed)
Pt eloped from waiting area. Called [facility] for room placement. No distress.

## 2019-08-25 NOTE — ED Triage Notes (Signed)
Pt sts she is having nausea and vomiting. Hx of same.

## 2019-08-30 ENCOUNTER — Other Ambulatory Visit: Payer: Self-pay

## 2019-08-30 ENCOUNTER — Encounter (HOSPITAL_COMMUNITY): Payer: Self-pay

## 2019-08-30 ENCOUNTER — Telehealth: Payer: Self-pay | Admitting: Gastroenterology

## 2019-08-30 ENCOUNTER — Ambulatory Visit (HOSPITAL_COMMUNITY)
Admission: RE | Admit: 2019-08-30 | Discharge: 2019-08-30 | Disposition: A | Discharge status: Home / Self Care | Payer: 59 | Source: Ambulatory Visit | Attending: Gastroenterology | Admitting: Gastroenterology

## 2019-08-30 DIAGNOSIS — K3184 Gastroparesis: Secondary | ICD-10-CM | POA: Insufficient documentation

## 2019-08-30 DIAGNOSIS — K529 Noninfective gastroenteritis and colitis, unspecified: Secondary | ICD-10-CM

## 2019-08-30 NOTE — Telephone Encounter (Signed)
That is unfortunate. [facility] needs this exam done prior to consideration for gastric pace maker. Can try next week, perhaps they can use the oatmeal for this rather than the standard meal of eggs? If she can't tolerate it, she will need to follow up with [facility] regardless and they can discuss if they need to have it prior to the pacemaker evaluation. Thanks

## 2019-08-30 NOTE — Telephone Encounter (Signed)
Patient notified of the recommendations and new appt dates and times

## 2019-08-30 NOTE — Telephone Encounter (Signed)
[facility] called to advise they were only able to do one image and the patient began to vomit they are not able to complete the test. Patient will need to be rescheduled

## 2019-08-30 NOTE — Telephone Encounter (Signed)
Patient went for GES this am.  Vomited and the test had to be cancelled.  Can be rescheduled to next Tuesday, but NM said she reported not keeping down solid food at all.  Please advise  Rescheduled right now for 09/06/19 10:15 arrival

## 2019-09-06 ENCOUNTER — Ambulatory Visit (HOSPITAL_COMMUNITY)
Admission: RE | Admit: 2019-09-06 | Discharge: 2019-09-06 | Disposition: A | Discharge status: Home / Self Care | Payer: 59 | Source: Ambulatory Visit | Attending: Gastroenterology | Admitting: Gastroenterology

## 2019-09-06 ENCOUNTER — Other Ambulatory Visit: Payer: Self-pay

## 2019-09-06 DIAGNOSIS — K3184 Gastroparesis: Secondary | ICD-10-CM | POA: Diagnosis not present

## 2019-09-06 DIAGNOSIS — K529 Noninfective gastroenteritis and colitis, unspecified: Secondary | ICD-10-CM | POA: Insufficient documentation

## 2019-09-06 MED ORDER — TECHNETIUM TC 99M SULFUR COLLOID
1.8000 | Freq: Once | INTRAVENOUS | Status: AC | PRN
  Administered 2019-09-06: 1.8 via INTRAVENOUS

## 2019-09-15 ENCOUNTER — Other Ambulatory Visit: Payer: Self-pay | Admitting: Gastroenterology

## 2019-09-24 ENCOUNTER — Other Ambulatory Visit: Payer: Self-pay

## 2019-09-24 ENCOUNTER — Encounter (HOSPITAL_COMMUNITY): Payer: Self-pay

## 2019-09-24 ENCOUNTER — Emergency Department (HOSPITAL_COMMUNITY)
Admission: EM | Admit: 2019-09-24 | Discharge: 2019-09-24 | Disposition: A | Discharge status: Home / Self Care | Payer: 59 | Source: Home / Self Care

## 2019-09-24 ENCOUNTER — Inpatient Hospital Stay (HOSPITAL_COMMUNITY)
Admission: AD | Admit: 2019-09-24 | Discharge: 2019-09-30 | DRG: 682 | Disposition: A | Discharge status: Home / Self Care | Payer: 59 | Attending: Internal Medicine | Admitting: Internal Medicine

## 2019-09-24 DIAGNOSIS — R109 Unspecified abdominal pain: Secondary | ICD-10-CM

## 2019-09-24 DIAGNOSIS — Z20822 Contact with and (suspected) exposure to covid-19: Secondary | ICD-10-CM | POA: Diagnosis present

## 2019-09-24 DIAGNOSIS — Z681 Body mass index (BMI) 19 or less, adult: Secondary | ICD-10-CM

## 2019-09-24 DIAGNOSIS — Z8249 Family history of ischemic heart disease and other diseases of the circulatory system: Secondary | ICD-10-CM

## 2019-09-24 DIAGNOSIS — K2970 Gastritis, unspecified, without bleeding: Secondary | ICD-10-CM | POA: Diagnosis present

## 2019-09-24 DIAGNOSIS — R1116 Cannabis hyperemesis syndrome: Secondary | ICD-10-CM | POA: Diagnosis present

## 2019-09-24 DIAGNOSIS — K219 Gastro-esophageal reflux disease without esophagitis: Secondary | ICD-10-CM | POA: Diagnosis present

## 2019-09-24 DIAGNOSIS — E1069 Type 1 diabetes mellitus with other specified complication: Secondary | ICD-10-CM | POA: Diagnosis present

## 2019-09-24 DIAGNOSIS — M545 Low back pain: Secondary | ICD-10-CM | POA: Diagnosis present

## 2019-09-24 DIAGNOSIS — Z833 Family history of diabetes mellitus: Secondary | ICD-10-CM | POA: Diagnosis not present

## 2019-09-24 DIAGNOSIS — E43 Unspecified severe protein-calorie malnutrition: Secondary | ICD-10-CM | POA: Diagnosis present

## 2019-09-24 DIAGNOSIS — Z800 Family history of malignant neoplasm of digestive organs: Secondary | ICD-10-CM | POA: Diagnosis not present

## 2019-09-24 DIAGNOSIS — U071 COVID-19: Secondary | ICD-10-CM | POA: Diagnosis present

## 2019-09-24 DIAGNOSIS — E1010 Type 1 diabetes mellitus with ketoacidosis without coma: Secondary | ICD-10-CM | POA: Diagnosis present

## 2019-09-24 DIAGNOSIS — N179 Acute kidney failure, unspecified: Secondary | ICD-10-CM | POA: Diagnosis present

## 2019-09-24 DIAGNOSIS — E860 Dehydration: Secondary | ICD-10-CM | POA: Diagnosis present

## 2019-09-24 DIAGNOSIS — E875 Hyperkalemia: Secondary | ICD-10-CM | POA: Diagnosis present

## 2019-09-24 DIAGNOSIS — Z794 Long term (current) use of insulin: Secondary | ICD-10-CM | POA: Diagnosis not present

## 2019-09-24 DIAGNOSIS — R778 Other specified abnormalities of plasma proteins: Secondary | ICD-10-CM | POA: Diagnosis not present

## 2019-09-24 DIAGNOSIS — E1043 Type 1 diabetes mellitus with diabetic autonomic (poly)neuropathy: Secondary | ICD-10-CM | POA: Diagnosis present

## 2019-09-24 DIAGNOSIS — R112 Nausea with vomiting, unspecified: Secondary | ICD-10-CM | POA: Diagnosis present

## 2019-09-24 DIAGNOSIS — G8929 Other chronic pain: Secondary | ICD-10-CM | POA: Diagnosis present

## 2019-09-24 DIAGNOSIS — F330 Major depressive disorder, recurrent, mild: Secondary | ICD-10-CM | POA: Diagnosis present

## 2019-09-24 DIAGNOSIS — E1159 Type 2 diabetes mellitus with other circulatory complications: Secondary | ICD-10-CM | POA: Diagnosis present

## 2019-09-24 DIAGNOSIS — Z5321 Procedure and treatment not carried out due to patient leaving prior to being seen by health care provider: Secondary | ICD-10-CM | POA: Insufficient documentation

## 2019-09-24 DIAGNOSIS — I152 Hypertension secondary to endocrine disorders: Secondary | ICD-10-CM | POA: Diagnosis present

## 2019-09-24 DIAGNOSIS — D649 Anemia, unspecified: Secondary | ICD-10-CM | POA: Diagnosis not present

## 2019-09-24 DIAGNOSIS — R1110 Vomiting, unspecified: Secondary | ICD-10-CM | POA: Diagnosis present

## 2019-09-24 DIAGNOSIS — R1115 Cyclical vomiting syndrome unrelated to migraine: Secondary | ICD-10-CM | POA: Diagnosis present

## 2019-09-24 DIAGNOSIS — F1290 Cannabis use, unspecified, uncomplicated: Secondary | ICD-10-CM | POA: Diagnosis present

## 2019-09-24 DIAGNOSIS — G43909 Migraine, unspecified, not intractable, without status migrainosus: Secondary | ICD-10-CM | POA: Diagnosis present

## 2019-09-24 DIAGNOSIS — Z79899 Other long term (current) drug therapy: Secondary | ICD-10-CM

## 2019-09-24 DIAGNOSIS — K3184 Gastroparesis: Secondary | ICD-10-CM | POA: Diagnosis present

## 2019-09-24 DIAGNOSIS — E109 Type 1 diabetes mellitus without complications: Secondary | ICD-10-CM | POA: Diagnosis present

## 2019-09-24 DIAGNOSIS — E10649 Type 1 diabetes mellitus with hypoglycemia without coma: Secondary | ICD-10-CM | POA: Diagnosis not present

## 2019-09-24 DIAGNOSIS — Z9049 Acquired absence of other specified parts of digestive tract: Secondary | ICD-10-CM

## 2019-09-24 DIAGNOSIS — E739 Lactose intolerance, unspecified: Secondary | ICD-10-CM | POA: Diagnosis present

## 2019-09-24 DIAGNOSIS — R52 Pain, unspecified: Secondary | ICD-10-CM

## 2019-09-24 LAB — URINALYSIS, ROUTINE W REFLEX MICROSCOPIC
Bilirubin Urine: NEGATIVE
Glucose, UA: >=500 mg/dL — AB
Ketones, ur: 5 mg/dL — AB
Leukocytes,Ua: NEGATIVE
Nitrite: NEGATIVE
Protein, ur: >=300 mg/dL — AB
Specific Gravity, Urine: 1.018 (ref 1.005–1.030)
pH: 5 (ref 5.0–8.0)

## 2019-09-24 LAB — CBC WITH DIFFERENTIAL/PLATELET
Abs Immature Granulocytes: 0.01 10*3/uL (ref 0.00–0.07)
Basophils Absolute: 0 10*3/uL (ref 0.0–0.1)
Basophils Relative: 1 %
Eosinophils Absolute: 0 10*3/uL (ref 0.0–0.5)
Eosinophils Relative: 0 %
HCT: 31 % — ABNORMAL LOW (ref 36.0–46.0)
Hemoglobin: 9.6 g/dL — ABNORMAL LOW (ref 12.0–15.0)
Immature Granulocytes: 0 %
Lymphocytes Relative: 22 %
Lymphs Abs: 1.3 10*3/uL (ref 0.7–4.0)
MCH: 25.5 pg — ABNORMAL LOW (ref 26.0–34.0)
MCHC: 31 g/dL (ref 30.0–36.0)
MCV: 82.2 fL (ref 80.0–100.0)
Monocytes Absolute: 0.2 10*3/uL (ref 0.1–1.0)
Monocytes Relative: 4 %
Neutro Abs: 4.3 10*3/uL (ref 1.7–7.7)
Neutrophils Relative %: 73 %
Platelets: 231 10*3/uL (ref 150–400)
RBC: 3.77 MIL/uL — ABNORMAL LOW (ref 3.87–5.11)
RDW: 14.5 % (ref 11.5–15.5)
WBC: 5.9 10*3/uL (ref 4.0–10.5)
nRBC: 0 % (ref 0.0–0.2)

## 2019-09-24 LAB — COMPREHENSIVE METABOLIC PANEL
ALT: 37 U/L (ref 0–44)
ALT: 35 U/L (ref 0–44)
AST: 33 U/L (ref 15–41)
AST: 25 U/L (ref 15–41)
Albumin: 3.2 g/dL — ABNORMAL LOW (ref 3.5–5.0)
Albumin: 3.3 g/dL — ABNORMAL LOW (ref 3.5–5.0)
Alkaline Phosphatase: 97 U/L (ref 38–126)
Alkaline Phosphatase: 101 U/L (ref 38–126)
Anion gap: 15 (ref 5–15)
Anion gap: 16 — ABNORMAL HIGH (ref 5–15)
BUN: 26 mg/dL — ABNORMAL HIGH (ref 6–20)
BUN: 37 mg/dL — ABNORMAL HIGH (ref 6–20)
CO2: 20 mmol/L — ABNORMAL LOW (ref 22–32)
CO2: 26 mmol/L (ref 22–32)
Calcium: 9.3 mg/dL (ref 8.9–10.3)
Calcium: 9 mg/dL (ref 8.9–10.3)
Chloride: 102 mmol/L (ref 98–111)
Chloride: 94 mmol/L — ABNORMAL LOW (ref 98–111)
Creatinine, Ser: 1.21 mg/dL — ABNORMAL HIGH (ref 0.44–1.00)
Creatinine, Ser: 1.94 mg/dL — ABNORMAL HIGH (ref 0.44–1.00)
GFR calc Af Amer: >60 mL/min (ref >60)
GFR calc Af Amer: 39 mL/min — ABNORMAL LOW (ref >60)
GFR calc non Af Amer: 60 mL/min — ABNORMAL LOW (ref >60)
GFR calc non Af Amer: 34 mL/min — ABNORMAL LOW (ref >60)
Glucose, Bld: 239 mg/dL — ABNORMAL HIGH (ref 70–99)
Glucose, Bld: 400 mg/dL — ABNORMAL HIGH (ref 70–99)
Potassium: 4.2 mmol/L (ref 3.5–5.1)
Potassium: 4 mmol/L (ref 3.5–5.1)
Sodium: 137 mmol/L (ref 135–145)
Sodium: 136 mmol/L (ref 135–145)
Total Bilirubin: 0.7 mg/dL (ref 0.3–1.2)
Total Bilirubin: 0.6 mg/dL (ref 0.3–1.2)
Total Protein: 8.5 g/dL — ABNORMAL HIGH (ref 6.5–8.1)
Total Protein: 8.9 g/dL — ABNORMAL HIGH (ref 6.5–8.1)

## 2019-09-24 LAB — RAPID URINE DRUG SCREEN, HOSP PERFORMED
Amphetamines: NOT DETECTED
Barbiturates: NOT DETECTED
Benzodiazepines: NOT DETECTED
Cocaine: NOT DETECTED
Opiates: NOT DETECTED
Tetrahydrocannabinol: POSITIVE — AB

## 2019-09-24 LAB — I-STAT BETA HCG BLOOD, ED (MC, WL, AP ONLY)
I-stat hCG, quantitative: <5 m[IU]/mL (ref <5)
I-stat hCG, quantitative: <5 m[IU]/mL (ref <5)

## 2019-09-24 LAB — CBC
HCT: 31.9 % — ABNORMAL LOW (ref 36.0–46.0)
Hemoglobin: 9.9 g/dL — ABNORMAL LOW (ref 12.0–15.0)
MCH: 25.9 pg — ABNORMAL LOW (ref 26.0–34.0)
MCHC: 31 g/dL (ref 30.0–36.0)
MCV: 83.5 fL (ref 80.0–100.0)
Platelets: 224 10*3/uL (ref 150–400)
RBC: 3.82 MIL/uL — ABNORMAL LOW (ref 3.87–5.11)
RDW: 14.2 % (ref 11.5–15.5)
WBC: 4.6 10*3/uL (ref 4.0–10.5)
nRBC: 0 % (ref 0.0–0.2)

## 2019-09-24 LAB — LIPASE, BLOOD
Lipase: 23 U/L (ref 11–51)
Lipase: 21 U/L (ref 11–51)

## 2019-09-24 LAB — CBG MONITORING, ED: Glucose-Capillary: 359 mg/dL — ABNORMAL HIGH (ref 70–99)

## 2019-09-24 LAB — SARS CORONAVIRUS 2 BY RT PCR (HOSPITAL ORDER, PERFORMED IN ~~LOC~~ HOSPITAL LAB): SARS Coronavirus 2: POSITIVE — AB

## 2019-09-24 MED ORDER — LORAZEPAM 0.5 MG PO TABS
0.5000 mg | ORAL_TABLET | Freq: Two times a day (BID) | ORAL | Status: DC | PRN
  Administered 2019-09-24 – 2019-09-29 (×2): 0.5 mg via ORAL
  Filled 2019-09-24 (×2): qty 1

## 2019-09-24 MED ORDER — METOCLOPRAMIDE HCL 5 MG/ML IJ SOLN
5.0000 mg | Freq: Once | INTRAMUSCULAR | Status: AC
  Administered 2019-09-24: 5 mg via INTRAVENOUS
  Filled 2019-09-24: qty 2

## 2019-09-24 MED ORDER — HYDROMORPHONE HCL 1 MG/ML IJ SOLN
1.0000 mg | Freq: Once | INTRAMUSCULAR | Status: AC
  Administered 2019-09-24: 1 mg via INTRAVENOUS
  Filled 2019-09-24: qty 1

## 2019-09-24 MED ORDER — SODIUM CHLORIDE 0.9 % IV BOLUS
1000.0000 mL | Freq: Once | INTRAVENOUS | Status: AC
  Administered 2019-09-24: 1000 mL via INTRAVENOUS

## 2019-09-24 MED ORDER — ONDANSETRON HCL 4 MG/2ML IJ SOLN
4.0000 mg | Freq: Once | INTRAMUSCULAR | Status: AC
  Administered 2019-09-24: 4 mg via INTRAVENOUS
  Filled 2019-09-24: qty 2

## 2019-09-24 MED ORDER — PANTOPRAZOLE SODIUM 40 MG IV SOLR
40.0000 mg | Freq: Once | INTRAVENOUS | Status: AC
  Administered 2019-09-24: 40 mg via INTRAVENOUS
  Filled 2019-09-24: qty 40

## 2019-09-24 MED ORDER — CYCLOBENZAPRINE HCL 10 MG PO TABS
10.0000 mg | ORAL_TABLET | Freq: Three times a day (TID) | ORAL | Status: DC | PRN
  Administered 2019-09-24: 10 mg via ORAL
  Filled 2019-09-24: qty 1

## 2019-09-24 MED ORDER — PANTOPRAZOLE SODIUM 40 MG IV SOLR
40.0000 mg | Freq: Two times a day (BID) | INTRAVENOUS | Status: DC
  Administered 2019-09-24 – 2019-09-30 (×12): 40 mg via INTRAVENOUS
  Filled 2019-09-24 (×12): qty 40

## 2019-09-24 MED ORDER — CAPSAICIN 0.025 % EX CREA
TOPICAL_CREAM | Freq: Two times a day (BID) | CUTANEOUS | Status: DC
  Filled 2019-09-24: qty 60

## 2019-09-24 NOTE — ED Notes (Signed)
Patient called 3x, not in lobby. Registration stated patient left.

## 2019-09-24 NOTE — ED Provider Notes (Signed)
[facility] DEPT Provider Note   CSN: [id number] Arrival date & time: 09/24/19  1256     History Chief Complaint  Patient presents with  . Emesis    MAILYNN EVERLY is a 29 y.o. female with PMHx type 1 Diabetes, chronic abdominal pain, HTN, cyclic vomiting syndrome, GERD, gastroparesis, cannabinoid hyperemesis syndrome   Presents to ER with concern for back pain, abdominal pain, vomiting.  Patient states symptoms since yesterday, similar to prior flares, gastroparesis.  Pain is severe sharp, stabbing, no associated numbness, weakness, bladder or bowel incontinence.  No falls.  Feels somewhat nauseous, multiple episodes of nonbloody nonbilious emesis.    HPI     Past Medical History:  Diagnosis Date  . Allergy   . Anemia   . Anxiety   . Blood transfusion without reported diagnosis    Dec 2018  . Cataract    right eye  . COVID-19   . Depression   . Diabetes type 1, uncontrolled ([place]) 11/14/2011   Since age 46  . Fibromyalgia   . Gastroparesis   . GERD (gastroesophageal reflux disease)   . Hypertension   . Infection    UTI April 2016    Patient Active Problem List   Diagnosis Date Noted  . Nausea & vomiting 04/30/2019  . Hypertensive urgency 04/30/2019  . Cannabinoid hyperemesis syndrome 04/30/2019  . Contraception management 08/06/2018  . Cyclical vomiting syndrome 06/07/2018  . Diabetic gastroparesis associated with type 1 diabetes mellitus ([place]) 06/04/2018  . Anxiety 05/31/2018  . Peripheral edema 05/31/2018  . Normocytic anemia 07/10/2017  . GERD (gastroesophageal reflux disease) 06/23/2017  . Intractable nausea and vomiting 04/11/2017  . MDD (major depressive disorder), recurrent episode, mild ([place]) 02/28/2017  . Diabetes mellitus type 1 ([place]) 04/28/2013  . Protein-calorie malnutrition, severe ([place]) 04/28/2013  . Hypertension associated with diabetes ([place]) 11/14/2011  . Chronic abdominal pain 09/18/2011    Past Surgical  History:  Procedure Laterality Date  . ANKLE SURGERY    . CHOLECYSTECTOMY  11/15/2011   Procedure: LAPAROSCOPIC CHOLECYSTECTOMY WITH INTRAOPERATIVE CHOLANGIOGRAM;  Surgeon: [name], MD;  Location: [facility];  Service: General;  Laterality: N/A;  . COLONOSCOPY    . COLONOSCOPY WITH PROPOFOL N/A 06/27/2017   Procedure: COLONOSCOPY WITH PROPOFOL;  Surgeon: Milus Banister, MD;  Location: [facility];  Service: Endoscopy;  Laterality: N/A;  . ESOPHAGOGASTRODUODENOSCOPY  12/03/2011   Procedure: ESOPHAGOGASTRODUODENOSCOPY (EGD);  Surgeon: Beryle Beams, MD;  Location: [facility];  Service: Endoscopy;  Laterality: N/A;  . FLEXIBLE SIGMOIDOSCOPY N/A 03/10/2017   Procedure: FLEXIBLE SIGMOIDOSCOPY;  Surgeon: Carol Ada, MD;  Location: [facility];  Service: Endoscopy;  Laterality: N/A;  . INCISION AND DRAINAGE PERIRECTAL ABSCESS N/A 03/01/2017   Procedure: IRRIGATION AND DEBRIDEMENT PERIRECTAL ABSCESS;  Surgeon: Alphonsa Overall, MD;  Location: [facility];  Service: General;  Laterality: N/A;  . IRRIGATION AND DEBRIDEMENT BUTTOCKS N/A 03/23/2017   Procedure: IRRIGATION AND DEBRIDEMENT BUTTOCKS, SETON PLACEMENT;  Surgeon: [name], MD;  Location: [facility];  Service: General;  Laterality: N/A;  . LAPAROSCOPY  11/23/2011   Procedure: LAPAROSCOPY DIAGNOSTIC;  Surgeon: Edward Jolly, MD;  Location: [facility];  Service: General;  Laterality: N/A;  . SIGMOIDOSCOPY    . UPPER GASTROINTESTINAL ENDOSCOPY    . WISDOM TOOTH EXTRACTION       OB History    Gravida  1   Para  1   Term      Preterm  1   AB  Living  1     SAB      TAB      Ectopic      Multiple  0   Live Births  1           Family History  Problem Relation Age of Onset  . Diabetes Mother   . Hypertension Father   . Colon cancer Paternal Grandmother        pt thinks PGM was dx in her 27's  . Diabetes Paternal Grandmother   . Diabetes Maternal Grandmother   . Diabetes Maternal Grandfather   . Diabetes  Paternal Grandfather   . Diabetes Other   . Esophageal cancer Neg Hx   . Liver cancer Neg Hx   . Pancreatic cancer Neg Hx   . Stomach cancer Neg Hx   . Rectal cancer Neg Hx     Social History   Tobacco Use  . Smoking status: Never Smoker  . Smokeless tobacco: Never Used  Vaping Use  . Vaping Use: Never used  Substance Use Topics  . Alcohol use: No    Alcohol/week: 0.0 standard drinks  . Drug use: Yes    Types: Marijuana    Home Medications Prior to Admission medications   Medication Sig Start Date End Date Taking? Authorizing Provider  aluminum-magnesium hydroxide-simethicone (MAALOX) [phone] MG/5ML SUSP Take 30 mLs by mouth 3 (three) times daily as needed (indigestion).    [provider]  Continuous Blood Gluc Receiver ([place]) [place] See admin instructions. USE AS DIRECTED TO MONITOR GLUCOSE LEVELS 10/29/18   [provider]  Continuous Blood Gluc Sensor (DEXCOM G6 SENSOR) MISC 1 each by Does not apply route See admin instructions. every 10 days. 12/31/18   Renato Shin, MD  Continuous Blood Gluc Transmit (DEXCOM G6 TRANSMITTER) MISC See admin instructions. USE AS DIRECTED TO MONITOR GLUCOSE LEVELS 10/29/18   [provider]  cyclobenzaprine (FLEXERIL) 10 MG tablet Take 1 tablet (10 mg total) by mouth 3 (three) times daily as needed for muscle spasms. 06/07/19   [name], MD  dicyclomine (BENTYL) 20 MG tablet Take 1 tablet (20 mg total) by mouth 3 (three) times daily as needed for spasms. 05/01/19   [name], MD  furosemide (LASIX) 40 MG tablet TAKE 1 TABLET BY MOUTH  DAILY Patient taking differently: Take 40 mg by mouth daily.  11/11/18   Armbruster, Carlota Raspberry, MD  hydrALAZINE (APRESOLINE) 50 MG tablet Take 1 tablet (50 mg total) by mouth every 6 (six) hours. Patient taking differently: Take 50 mg by mouth 2 (two) times daily.  10/23/18   Swayze, Ava, DO  Insulin Glargine (LANTUS SOLOSTAR) 100 UNIT/ML Solostar Pen Inject 10  Units into the skin at bedtime. And pen needles 4/day 12/31/18   Renato Shin, MD  insulin lispro (HUMALOG KWIKPEN) 100 UNIT/ML KwikPen Inject 0.06 mLs (6 Units total) into the skin 3 (three) times daily. Patient taking differently: Inject 1-9 Units into the skin 3 (three) times daily. Per sliding scale 12/31/18   Renato Shin, MD  loperamide (IMODIUM) 2 MG capsule Take 6 mg by mouth as needed for diarrhea or loose stools.    [provider]  LORazepam (ATIVAN) 0.5 MG tablet Take 1 tablet (0.5 mg total) by mouth 2 (two) times daily as needed for anxiety. 06/30/19   [name], MD  metoCLOPramide (REGLAN) 5 MG tablet Take 1 tablet (5 mg total) by mouth in the morning and at bedtime. 08/11/19   Armbruster,  Carlota Raspberry, MD  metoprolol tartrate (LOPRESSOR) 25 MG tablet Take 1 tablet (25 mg total) by mouth 2 (two) times daily. 05/22/19 07/30/19  Arrien, Jimmy Picket, MD  mirtazapine (REMERON) 30 MG tablet TAKE 1 TABLET BY MOUTH AT  BEDTIME Patient taking differently: Take 30 mg by mouth at bedtime.  04/19/19   [name], MD  norethindrone (ORTHO MICRONOR) 0.35 MG tablet Take 1 tablet (0.35 mg total) by mouth daily. 06/30/19   [name], MD  OVER THE COUNTER MEDICATION Take 2 each by mouth daily. Flo Chewable Gummies for period    [provider]  pantoprazole (PROTONIX) 40 MG tablet Take 1 tablet (40 mg total) by mouth daily. 09/16/19   Armbruster, Carlota Raspberry, MD  potassium chloride (KLOR-CON) 10 MEQ tablet Take 1 tablet (10 mEq total) by mouth daily for 5 days. 08/12/19 08/17/19  Eustaquio Maize, PA-C  promethazine (PHENERGAN) 12.5 MG tablet Take 1 tablet (12.5 mg total) by mouth every 8 (eight) hours as needed for nausea or vomiting. 08/11/19   Armbruster, Carlota Raspberry, MD  promethazine (PHENERGAN) 25 MG suppository Place 1 suppository (25 mg total) rectally in the morning and at bedtime. DO NOT take oral phenergan and suppositories at the same time. 08/11/19   Armbruster, Carlota Raspberry, MD    sucralfate (CARAFATE) 1 GM/10ML suspension Take 10 mLs (1 g total) by mouth 4 (four) times daily -  with meals and at bedtime. 06/23/19   Palumbo, April, MD  potassium chloride SA (KLOR-CON) 20 MEQ tablet Take 1 tablet (20 mEq total) by mouth daily for 5 doses. 07/04/19 07/30/19  Janeece Fitting, PA-C    Allergies    Other and Lactose intolerance (gi)  Review of Systems   Review of Systems  Constitutional: Negative for chills and fever.  HENT: Negative for ear pain and sore throat.   Eyes: Negative for pain and visual disturbance.  Respiratory: Negative for cough and shortness of breath.   Cardiovascular: Negative for chest pain and palpitations.  Gastrointestinal: Positive for abdominal pain, nausea and vomiting.  Genitourinary: Negative for dysuria and hematuria.  Musculoskeletal: Positive for back pain. Negative for arthralgias.  Skin: Negative for color change and rash.  Neurological: Negative for seizures and syncope.  All other systems reviewed and are negative.   Physical Exam Updated Vital Signs BP (!) 158/112 (BP Location: Right Arm)   Pulse (!) 118   Temp 98.3 F (36.8 C) (Oral)   Resp (!) 21   SpO2 100%   Physical Exam Vitals and nursing note reviewed.  Constitutional:      General: She is not in acute distress.    Appearance: She is well-developed.  HENT:     Head: Normocephalic and atraumatic.  Eyes:     Conjunctiva/sclera: Conjunctivae normal.  Cardiovascular:     Rate and Rhythm: Regular rhythm. Tachycardia present.     Heart sounds: No murmur heard.   Pulmonary:     Effort: Pulmonary effort is normal. No respiratory distress.     Breath sounds: Normal breath sounds.  Abdominal:     Palpations: Abdomen is soft.     Tenderness: There is abdominal tenderness.     Comments: abd soft, non distended, there is generalized TTP, no rebound or guarding  Musculoskeletal:        General: No deformity or signs of injury.     Cervical back: Neck supple.  Skin:     General: Skin is warm and dry.  Neurological:  General: No focal deficit present.     Mental Status: She is alert and oriented to person, place, and time.     ED Results / Procedures / Treatments   Labs (all labs ordered are listed, but only abnormal results are displayed) Labs Reviewed  CBC WITH DIFFERENTIAL/PLATELET - Abnormal; Notable for the following components:      Result Value   RBC 3.77 (*)    Hemoglobin 9.6 (*)    HCT 31.0 (*)    MCH 25.5 (*)    All other components within normal limits  COMPREHENSIVE METABOLIC PANEL - Abnormal; Notable for the following components:   Chloride 94 (*)    Glucose, Bld 400 (*)    BUN 37 (*)    Creatinine, Ser 1.94 (*)    Total Protein 8.9 (*)    Albumin 3.3 (*)    GFR calc non Af Amer 34 (*)    GFR calc Af Amer 39 (*)    Anion gap 16 (*)    All other components within normal limits  URINALYSIS, ROUTINE W REFLEX MICROSCOPIC - Abnormal; Notable for the following components:   APPearance HAZY (*)    Glucose, UA >=500 (*)    Hgb urine dipstick MODERATE (*)    Ketones, ur 5 (*)    Protein, ur >=300 (*)    Bacteria, UA RARE (*)    All other components within normal limits  RAPID URINE DRUG SCREEN, HOSP PERFORMED - Abnormal; Notable for the following components:   Tetrahydrocannabinol POSITIVE (*)    All other components within normal limits  CBG MONITORING, ED - Abnormal; Notable for the following components:   Glucose-Capillary 359 (*)    All other components within normal limits  SARS CORONAVIRUS 2 BY RT PCR (HOSPITAL ORDER, [facility])  LIPASE, BLOOD  I-STAT BETA HCG BLOOD, ED ([facility], [facility], [facility] ONLY)    EKG None  Radiology No results found.  Procedures Procedures (including critical care time)  Medications Ordered in ED Medications  pantoprazole (PROTONIX) injection 40 mg (has no administration in time range)  metoCLOPramide (REGLAN) injection 5 mg (has no administration in time range)  sodium  chloride 0.9 % bolus 1,000 mL (has no administration in time range)  sodium chloride 0.9 % bolus 1,000 mL (1,000 mLs Intravenous New Bag/Given 09/24/19 1637)  ondansetron (ZOFRAN) injection 4 mg (4 mg Intravenous Given 09/24/19 1637)  HYDROmorphone (DILAUDID) injection 1 mg (1 mg Intravenous Given 09/24/19 1638)    ED Course  I have reviewed the triage vital signs and the nursing notes.  Pertinent labs & imaging results that were available during my care of the patient were reviewed by me and considered in my medical decision making (see chart for details).    MDM Rules/Calculators/A&P                          OTHA RICKLES is a 29 y.o. female with PMHx type 1 Diabetes, chronic abdominal pain, HTN, cyclic vomiting syndrome, GERD, gastroparesis, cannabinoid hyperemesis syndrome presented to ER with acute on chronic abdominal pain/back pain/vomiting.  Abdomen soft, no focal tenderness.  Symptoms similar to prior.  Lab work concerning for dehydration, AKI.  Given current soft abdominal exam and symptoms similar to prior, do not feel patient needs emergent CT imaging today. Given symptoms, AKI, believe patient would benefit from admission for IV hydration and symptomatic control.    Will consult hospitalist for admission.  Final Clinical Impression(s) / ED Diagnoses Final diagnoses:  Intractable vomiting with nausea, unspecified vomiting type  AKI (acute kidney injury) [facility])    Rx / DC Orders ED Discharge Orders    None       Lucrezia Starch, MD 09/24/19 1751

## 2019-09-24 NOTE — ED Triage Notes (Signed)
Patient reports vomiting and back pain since yesterday. Patient states she was here earlier but left because the wait was too long. Patient now back with same complaints. Back pain reported is in mid back. Pain 10/10

## 2019-09-24 NOTE — H&P (Addendum)
History and Physical    [name] DOB: 1990-07-21 DOA: 09/24/2019  PCP: [name], MD  Patient coming from: home  I have personally briefly reviewed patient's old medical records in [place]  Chief Complaint: abdominal pain  HPI: [name] is a 29 y.o. female with medical history significant of Type 1 DM x 20 years, hypertension with multiple ED visits, admissions for cyclic nausea and vomiting with possible gastroparesis, though had nml gastric emptying study on 09/06/19, which made her not a candidate for a gastric pacemaker, which had been the plan with [facility]. There is possibly a component of cannabinoid hyperemesis syndrome. Her present symptoms started 2 days ago.  Her pain is severe sharp, stabbing, with multiple episodes of nonbloody emesis.  She has tried her usual home meds to no avail. Her usual medications include Bentyl, Maalox, Carafate, Phenergan, Protonix, and Reglan.  She has a history of Reglan worsening her diarrhea and so she is on low-dose of 5 mg twice daily. ED Course: In the ED she was given IV fluids IV Phenergan IV Reglan and her abdominal pain and vomiting continued.  She was noted to have a doubling of her serum creatinine and anion gap of 16.  Her CBG was 400 with a normal lipase.  Her BPs are noted to be markedly elevated which she associates with ongoing pain.  We are asked to admit her for dehydration with increased anion gap and ongoing vomiting issues.  Review of Systems: As per HPI otherwise 10 point review of systems negative.   Past Medical History:  Diagnosis Date  . Allergy   . Anemia   . Anxiety   . Blood transfusion without reported diagnosis    Dec 2018  . Cataract    right eye  . COVID-19   . Depression   . Diabetes type 1, uncontrolled ([place]) 11/14/2011   Since age 73  . Fibromyalgia   . Gastroparesis   . GERD (gastroesophageal reflux disease)   . Hypertension   . Infection    UTI April 2016     Past Surgical History:  Procedure Laterality Date  . ANKLE SURGERY    . CHOLECYSTECTOMY  11/15/2011   Procedure: LAPAROSCOPIC CHOLECYSTECTOMY WITH INTRAOPERATIVE CHOLANGIOGRAM;  Surgeon: [name], MD;  Location: [facility];  Service: General;  Laterality: N/A;  . COLONOSCOPY    . COLONOSCOPY WITH PROPOFOL N/A 06/27/2017   Procedure: COLONOSCOPY WITH PROPOFOL;  Surgeon: Milus Banister, MD;  Location: [facility];  Service: Endoscopy;  Laterality: N/A;  . ESOPHAGOGASTRODUODENOSCOPY  12/03/2011   Procedure: ESOPHAGOGASTRODUODENOSCOPY (EGD);  Surgeon: Beryle Beams, MD;  Location: [facility];  Service: Endoscopy;  Laterality: N/A;  . FLEXIBLE SIGMOIDOSCOPY N/A 03/10/2017   Procedure: FLEXIBLE SIGMOIDOSCOPY;  Surgeon: Carol Ada, MD;  Location: [facility];  Service: Endoscopy;  Laterality: N/A;  . INCISION AND DRAINAGE PERIRECTAL ABSCESS N/A 03/01/2017   Procedure: IRRIGATION AND DEBRIDEMENT PERIRECTAL ABSCESS;  Surgeon: Alphonsa Overall, MD;  Location: [facility];  Service: General;  Laterality: N/A;  . IRRIGATION AND DEBRIDEMENT BUTTOCKS N/A 03/23/2017   Procedure: IRRIGATION AND DEBRIDEMENT BUTTOCKS, SETON PLACEMENT;  Surgeon: [name], MD;  Location: [facility];  Service: General;  Laterality: N/A;  . LAPAROSCOPY  11/23/2011   Procedure: LAPAROSCOPY DIAGNOSTIC;  Surgeon: Edward Jolly, MD;  Location: [facility];  Service: General;  Laterality: N/A;  . SIGMOIDOSCOPY    . UPPER GASTROINTESTINAL ENDOSCOPY    . WISDOM TOOTH EXTRACTION  reports that she has never smoked. She has never used smokeless tobacco. She reports current drug use. Drug: Marijuana. She reports that she does not drink alcohol.  Allergies  Allergen Reactions  . Other Anaphylaxis    Reaction to [place] nuts   . Lactose Intolerance (Gi) Diarrhea    Family History  Problem Relation Age of Onset  . Diabetes Mother   . Hypertension Father   . Colon cancer Paternal Grandmother        pt thinks PGM was dx in  her 68's  . Diabetes Paternal Grandmother   . Diabetes Maternal Grandmother   . Diabetes Maternal Grandfather   . Diabetes Paternal Grandfather   . Diabetes Other   . Esophageal cancer Neg Hx   . Liver cancer Neg Hx   . Pancreatic cancer Neg Hx   . Stomach cancer Neg Hx   . Rectal cancer Neg Hx     Prior to Admission medications   Medication Sig Start Date End Date Taking? Authorizing Provider  aluminum-magnesium hydroxide-simethicone (MAALOX) 010-272-53 MG/5ML SUSP Take 30 mLs by mouth 3 (three) times daily as needed (indigestion).   Yes [provider]  cyclobenzaprine (FLEXERIL) 10 MG tablet Take 1 tablet (10 mg total) by mouth 3 (three) times daily as needed for muscle spasms. 06/07/19  Yes [name], MD  dicyclomine (BENTYL) 20 MG tablet Take 1 tablet (20 mg total) by mouth 3 (three) times daily as needed for spasms. 05/01/19  Yes [name], MD  furosemide (LASIX) 40 MG tablet TAKE 1 TABLET BY MOUTH  DAILY Patient taking differently: Take 40 mg by mouth daily.  11/11/18  Yes Armbruster, Carlota Raspberry, MD  hydrALAZINE (APRESOLINE) 50 MG tablet Take 1 tablet (50 mg total) by mouth every 6 (six) hours. Patient taking differently: Take 50 mg by mouth 2 (two) times daily.  10/23/18  Yes Swayze, Ava, DO  Insulin Glargine (LANTUS SOLOSTAR) 100 UNIT/ML Solostar Pen Inject 10 Units into the skin at bedtime. And pen needles 4/day 12/31/18  Yes Renato Shin, MD  insulin lispro (HUMALOG KWIKPEN) 100 UNIT/ML KwikPen Inject 0.06 mLs (6 Units total) into the skin 3 (three) times daily. Patient taking differently: Inject 1-9 Units into the skin 3 (three) times daily. Per sliding scale 12/31/18  Yes Renato Shin, MD  loperamide (IMODIUM) 2 MG capsule Take 6 mg by mouth daily as needed for diarrhea or loose stools.    Yes [provider]  LORazepam (ATIVAN) 0.5 MG tablet Take 1 tablet (0.5 mg total) by mouth 2 (two) times daily as needed for anxiety. 06/30/19  Yes [name],  MD  metoCLOPramide (REGLAN) 5 MG tablet Take 1 tablet (5 mg total) by mouth in the morning and at bedtime. 08/11/19  Yes Armbruster, Carlota Raspberry, MD  metoprolol tartrate (LOPRESSOR) 25 MG tablet Take 25 mg by mouth 2 (two) times daily.   Yes [provider]  mirtazapine (REMERON) 30 MG tablet TAKE 1 TABLET BY MOUTH AT  BEDTIME Patient taking differently: Take 30 mg by mouth at bedtime.  04/19/19  Yes [name], MD  mirtazapine (REMERON) 30 MG tablet Take 30 mg by mouth at bedtime.   Yes [provider]  norethindrone (ORTHO MICRONOR) 0.35 MG tablet Take 1 tablet (0.35 mg total) by mouth daily. 06/30/19  Yes [name], MD  OVER THE COUNTER MEDICATION Take 2 each by mouth daily. Flo Chewable Gummies for period   Yes [provider]  pantoprazole (PROTONIX) 40  MG tablet Take 1 tablet (40 mg total) by mouth daily. 09/16/19  Yes Armbruster, Carlota Raspberry, MD  promethazine (PHENERGAN) 12.5 MG tablet Take 1 tablet (12.5 mg total) by mouth every 8 (eight) hours as needed for nausea or vomiting. 08/11/19  Yes Armbruster, Carlota Raspberry, MD  promethazine (PHENERGAN) 25 MG suppository Place 1 suppository (25 mg total) rectally in the morning and at bedtime. DO NOT take oral phenergan and suppositories at the same time. 08/11/19  Yes Armbruster, Carlota Raspberry, MD  sucralfate (CARAFATE) 1 GM/10ML suspension Take 10 mLs (1 g total) by mouth 4 (four) times daily -  with meals and at bedtime. 06/23/19  Yes Palumbo, April, MD  Continuous Blood Gluc Receiver ([place]) [place] See admin instructions. USE AS DIRECTED TO MONITOR GLUCOSE LEVELS 10/29/18   [provider]  Continuous Blood Gluc Sensor (DEXCOM G6 SENSOR) MISC 1 each by Does not apply route See admin instructions. every 10 days. 12/31/18   Renato Shin, MD  Continuous Blood Gluc Transmit (DEXCOM G6 TRANSMITTER) MISC See admin instructions. USE AS DIRECTED TO MONITOR GLUCOSE LEVELS 10/29/18   [provider]  metoprolol  tartrate (LOPRESSOR) 25 MG tablet Take 1 tablet (25 mg total) by mouth 2 (two) times daily. 05/22/19 07/30/19  Arrien, Jimmy Picket, MD  potassium chloride (KLOR-CON) 10 MEQ tablet Take 1 tablet (10 mEq total) by mouth daily for 5 days. 08/12/19 08/17/19  Alroy Bailiff, Margaux, PA-C  potassium chloride SA (KLOR-CON) 20 MEQ tablet Take 1 tablet (20 mEq total) by mouth daily for 5 doses. 07/04/19 07/30/19  Janeece Fitting, PA-C    Physical Exam: Vitals:   09/24/19 1759 09/24/19 1814 09/24/19 1845 09/24/19 1900  BP: (!) 207/132  (!) 156/97 (!) 174/96  Pulse: (!) 120 (!) 108 99 (!) 103  Resp: (!) 21 12 (!) 0 (!) 21  Temp:      TempSrc:      SpO2: 100% 100% (!) 28% 97%    Constitutional: NAD, calm, uncomfortable Eyes: PERRL, lids and conjunctivae normal ENMT: Mucous membranes are moist. Posterior pharynx clear of any exudate or lesions.  Neck: normal, supple, no masses, no thyromegaly Respiratory: clear to auscultation bilaterally, no wheezing, no crackles. Normal respiratory effort. No accessory muscle use.  Cardiovascular: Tachycardic rate and rhythm, no murmurs / rubs / gallops. No extremity edema. 2+ pedal pulses. No carotid bruits.  Abdomen: no tenderness, no masses palpated. No hepatosplenomegaly. Bowel sounds positive.  Musculoskeletal: no clubbing / cyanosis. No joint deformity upper and lower extremities. Good ROM, no contractures. Normal muscle tone.  Skin: no rashes, lesions, ulcers. No induration Neurologic: CN 2-12 grossly intact. Sensation intact, DTR normal. Strength 5/5 in all 4.  Psychiatric: Normal judgment and insight. Alert and oriented x 3. Normal mood.   Labs on Admission: I have personally reviewed following labs and imaging studies  CBC: Recent Labs  Lab 09/24/19 0141 09/24/19 1630  WBC 4.6 5.9  NEUTROABS  --  4.3  HGB 9.9* 9.6*  HCT 31.9* 31.0*  MCV 83.5 82.2  PLT 224 829   Basic Metabolic Panel: Recent Labs  Lab 09/24/19 0141 09/24/19 1630  NA 137 136  K 4.2 4.0   CL 102 94*  CO2 20* 26  GLUCOSE 239* 400*  BUN 26* 37*  CREATININE 1.21* 1.94*  CALCIUM 9.3 9.0   GFR: Estimated Creatinine Clearance: 36.4 mL/min (A) (by C-G formula based on SCr of 1.94 mg/dL (H)). Liver Function Tests: Recent Labs  Lab 09/24/19 0141 09/24/19 1630  AST 33 25  ALT 37 35  ALKPHOS 97 101  BILITOT 0.7 0.6  PROT 8.5* 8.9*  ALBUMIN 3.2* 3.3*   Recent Labs  Lab 09/24/19 0141 09/24/19 1630  LIPASE 23 21   CBG: Recent Labs  Lab 09/24/19 1619  GLUCAP 359*   Urine analysis:    Component Value Date/Time   COLORURINE YELLOW 09/24/2019 1630   APPEARANCEUR HAZY (A) 09/24/2019 1630   LABSPEC 1.018 09/24/2019 1630   PHURINE 5.0 09/24/2019 1630   GLUCOSEU >=500 (A) 09/24/2019 1630   HGBUR MODERATE (A) 09/24/2019 1630   BILIRUBINUR NEGATIVE 09/24/2019 1630   BILIRUBINUR negative 01/02/2015 1717   KETONESUR 5 (A) 09/24/2019 1630   PROTEINUR >=300 (A) 09/24/2019 1630   UROBILINOGEN 0.2 01/13/2015 0223   NITRITE NEGATIVE 09/24/2019 [place] 09/24/2019 1630    Radiological Exams on Admission: No results found.  EKG: Independently reviewed.   Assessment/Plan Active Problems:   Chronic abdominal pain   Hypertension associated with diabetes ([place])   Diabetes mellitus type 1 (H[facility])   Protein-calorie malnutrition, severe (H[facility])   MDD (major depressive disorder), recurrent episode, mild (HCC)   Intractable nausea and vomiting   GERD (gastroesophageal reflux disease)   Normocytic anemia   Cyclical vomiting syndrome   Cannabinoid hyperemesis syndrome   COVID-19 virus infection   Cyclic vomiting syndrome  Cyclic vomiting syndrome Question related to ongoing cannabis use, type 1 diabetes with gastroparesis.  Her combination of both.  Reports last use of cannabis approximately 2 days ago and ongoing use every other day.  We will resume her Phenergan, Reglan, Bentyl, Protonix, Carafate.  Change to IV and with the addition of Zofran as needed.  Difficult IV access  Chronic abdominal pain She received Dilaudid 1 mg in the ED we will hold further narcotics as this will further delay gastric emptying.  Flexeril and capsaicin as needed. After approximately 7 hours of ongoing screaming in the ED, I have written her for Dilaudid 1 to 2 mg every 4 hours as needed for pain.  Diabetes mellitus type 1 Will place on Endo tool given anion gap and blood sugar over 400. Has endocrinologist, Dr. [name].  Usually on Lantus and Humalog although considering going back on insulin pump.  Repeat hemoglobin A1c, last 7.8 in January 2021  COVID-19 positive Patient has a history of previous infection in February 2021.  It has been more than 90 days but it is unclear to me if this is new infection.  She is without symptoms  Diabetic nephropathy Has greater than 300 of protein on urinalysis going back to September 2020  Poorly controlled hypertension Continue her Lopressor twice daily If pain is under control and BP remians elevated, consider increasing BP med +/- addition of ARB or ACE-1, once kidney function improves  Anion Gap 16, with elevated CBG and normal HCO3. Suspect dehydration. IVF hydration.  Acute renal insufficiency Baseline creatinine was 0.93 and April of this year, 1.94 today.  Suspect dehydration Avoid nephrotoxic agents IV fluid hydration.  Depression and anxiety Continue Remeron and Ativan as needed  Elevated total protein This has been elevated before we will trend may need to consider SPEP, UPEP if continues to be an issue.  Anemia with hemoglobin of 9.6 Previous work-up done to include normal ferritin 207 and February 2021, normal iron studies, normal B12, normal folate in March 2020  DVT prophylaxis: Lovenox SQ Code Status: Full code  Family Communication: family at bedside  Disposition Plan: home  Consults  called: none Admission status: Observation   Donnamae Jude MD [facility]  If 7PM-7AM, please  contact night-coverage 09/24/2019, 9:17 PM

## 2019-09-24 NOTE — ED Triage Notes (Signed)
Pt sts nausea and vomiting starting earlier today.

## 2019-09-24 NOTE — ED Notes (Signed)
Per patient placement, patient will have to be assigned a new bed due to COVID positive status.

## 2019-09-25 ENCOUNTER — Encounter (HOSPITAL_COMMUNITY): Payer: Self-pay | Admitting: Internal Medicine

## 2019-09-25 DIAGNOSIS — R778 Other specified abnormalities of plasma proteins: Secondary | ICD-10-CM

## 2019-09-25 DIAGNOSIS — E1069 Type 1 diabetes mellitus with other specified complication: Secondary | ICD-10-CM

## 2019-09-25 LAB — CBG MONITORING, ED
Glucose-Capillary: 121 mg/dL — ABNORMAL HIGH (ref 70–99)
Glucose-Capillary: 161 mg/dL — ABNORMAL HIGH (ref 70–99)
Glucose-Capillary: 208 mg/dL — ABNORMAL HIGH (ref 70–99)
Glucose-Capillary: 365 mg/dL — ABNORMAL HIGH (ref 70–99)
Glucose-Capillary: 378 mg/dL — ABNORMAL HIGH (ref 70–99)

## 2019-09-25 LAB — CBC
HCT: 27.3 % — ABNORMAL LOW (ref 36.0–46.0)
Hemoglobin: 8.6 g/dL — ABNORMAL LOW (ref 12.0–15.0)
MCH: 25.7 pg — ABNORMAL LOW (ref 26.0–34.0)
MCHC: 31.5 g/dL (ref 30.0–36.0)
MCV: 81.7 fL (ref 80.0–100.0)
Platelets: 214 10*3/uL (ref 150–400)
RBC: 3.34 MIL/uL — ABNORMAL LOW (ref 3.87–5.11)
RDW: 14.5 % (ref 11.5–15.5)
WBC: 5.9 10*3/uL (ref 4.0–10.5)
nRBC: 0 % (ref 0.0–0.2)

## 2019-09-25 LAB — COMPREHENSIVE METABOLIC PANEL
ALT: 31 U/L (ref 0–44)
AST: 28 U/L (ref 15–41)
Albumin: 2.9 g/dL — ABNORMAL LOW (ref 3.5–5.0)
Alkaline Phosphatase: 87 U/L (ref 38–126)
Anion gap: 14 (ref 5–15)
BUN: 37 mg/dL — ABNORMAL HIGH (ref 6–20)
CO2: 24 mmol/L (ref 22–32)
Calcium: 8.5 mg/dL — ABNORMAL LOW (ref 8.9–10.3)
Chloride: 100 mmol/L (ref 98–111)
Creatinine, Ser: 1.49 mg/dL — ABNORMAL HIGH (ref 0.44–1.00)
GFR calc Af Amer: 54 mL/min — ABNORMAL LOW (ref >60)
GFR calc non Af Amer: 47 mL/min — ABNORMAL LOW (ref >60)
Glucose, Bld: 93 mg/dL (ref 70–99)
Potassium: 3.8 mmol/L (ref 3.5–5.1)
Sodium: 138 mmol/L (ref 135–145)
Total Bilirubin: 0.4 mg/dL (ref 0.3–1.2)
Total Protein: 8 g/dL (ref 6.5–8.1)

## 2019-09-25 LAB — HEMOGLOBIN A1C
Hgb A1c MFr Bld: 8.3 % — ABNORMAL HIGH (ref 4.8–5.6)
Mean Plasma Glucose: 191.51 mg/dL

## 2019-09-25 LAB — GLUCOSE, CAPILLARY
Glucose-Capillary: 273 mg/dL — ABNORMAL HIGH (ref 70–99)
Glucose-Capillary: 75 mg/dL (ref 70–99)
Glucose-Capillary: 167 mg/dL — ABNORMAL HIGH (ref 70–99)

## 2019-09-25 MED ORDER — LOPERAMIDE HCL 2 MG PO CAPS: 6.0000 mg | ORAL_CAPSULE | Freq: Every day | ORAL | Status: DC | PRN

## 2019-09-25 MED ORDER — INSULIN GLARGINE 100 UNIT/ML ~~LOC~~ SOLN
6.0000 [IU] | Freq: Every day | SUBCUTANEOUS | Status: DC
  Administered 2019-09-25 – 2019-09-26 (×2): 6 [IU] via SUBCUTANEOUS
  Filled 2019-09-25 (×2): qty 0.06

## 2019-09-25 MED ORDER — METOPROLOL TARTRATE 5 MG/5ML IV SOLN: 5.0000 mg | INTRAVENOUS | Status: DC | PRN

## 2019-09-25 MED ORDER — DEXCOM G6 SENSOR MISC: 1.0000 | Status: DC

## 2019-09-25 MED ORDER — POTASSIUM CHLORIDE CRYS ER 10 MEQ PO TBCR
10.0000 meq | EXTENDED_RELEASE_TABLET | Freq: Every day | ORAL | Status: DC
  Administered 2019-09-25 – 2019-09-28 (×4): 10 meq via ORAL
  Filled 2019-09-25 (×4): qty 1

## 2019-09-25 MED ORDER — DICYCLOMINE HCL 20 MG PO TABS
20.0000 mg | ORAL_TABLET | Freq: Three times a day (TID) | ORAL | Status: DC | PRN
  Filled 2019-09-25: qty 1

## 2019-09-25 MED ORDER — METOPROLOL TARTRATE 5 MG/5ML IV SOLN
2.5000 mg | Freq: Once | INTRAVENOUS | Status: AC
  Administered 2019-09-25: 2.5 mg via INTRAVENOUS
  Filled 2019-09-25: qty 5

## 2019-09-25 MED ORDER — METOCLOPRAMIDE HCL 5 MG/ML IJ SOLN
10.0000 mg | Freq: Three times a day (TID) | INTRAMUSCULAR | Status: DC
  Administered 2019-09-25 – 2019-09-29 (×13): 10 mg via INTRAVENOUS
  Filled 2019-09-25 (×14): qty 2

## 2019-09-25 MED ORDER — MIRTAZAPINE 15 MG PO TABS
30.0000 mg | ORAL_TABLET | Freq: Every day | ORAL | Status: DC
  Administered 2019-09-25 – 2019-09-29 (×6): 30 mg via ORAL
  Filled 2019-09-25: qty 1
  Filled 2019-09-25 (×5): qty 2

## 2019-09-25 MED ORDER — FUROSEMIDE 40 MG PO TABS
40.0000 mg | ORAL_TABLET | Freq: Every day | ORAL | Status: DC
  Administered 2019-09-25 – 2019-09-28 (×4): 40 mg via ORAL
  Filled 2019-09-25 (×4): qty 1

## 2019-09-25 MED ORDER — SODIUM CHLORIDE 0.9 % IV SOLN: INTRAVENOUS | Status: DC

## 2019-09-25 MED ORDER — DEXTROSE 50 % IV SOLN: 0.0000 mL | INTRAVENOUS | Status: DC | PRN

## 2019-09-25 MED ORDER — INSULIN REGULAR(HUMAN) IN NACL 100-0.9 UT/100ML-% IV SOLN
INTRAVENOUS | Status: DC
  Administered 2019-09-25: 5.5 [IU]/h via INTRAVENOUS
  Filled 2019-09-25: qty 100

## 2019-09-25 MED ORDER — ALUM & MAG HYDROXIDE-SIMETH 200-200-20 MG/5ML PO SUSP: 30.0000 mL | Freq: Three times a day (TID) | ORAL | Status: DC | PRN

## 2019-09-25 MED ORDER — CYCLOBENZAPRINE HCL 10 MG PO TABS
10.0000 mg | ORAL_TABLET | Freq: Three times a day (TID) | ORAL | Status: DC | PRN
  Administered 2019-09-27 – 2019-09-30 (×2): 10 mg via ORAL
  Filled 2019-09-25 (×2): qty 1

## 2019-09-25 MED ORDER — ONDANSETRON HCL 4 MG/2ML IJ SOLN
4.0000 mg | Freq: Four times a day (QID) | INTRAMUSCULAR | Status: DC
  Administered 2019-09-25 – 2019-09-29 (×18): 4 mg via INTRAVENOUS
  Filled 2019-09-25 (×18): qty 2

## 2019-09-25 MED ORDER — HYDROMORPHONE HCL 1 MG/ML IJ SOLN: 1.0000 mg | INTRAMUSCULAR | Status: DC | PRN

## 2019-09-25 MED ORDER — ENOXAPARIN SODIUM 40 MG/0.4ML ~~LOC~~ SOLN
40.0000 mg | SUBCUTANEOUS | Status: DC
  Administered 2019-09-25 – 2019-09-30 (×6): 40 mg via SUBCUTANEOUS
  Filled 2019-09-25 (×6): qty 0.4

## 2019-09-25 MED ORDER — HYDROMORPHONE HCL 1 MG/ML IJ SOLN
0.5000 mg | INTRAMUSCULAR | Status: DC | PRN
  Administered 2019-09-25: 2 mg via INTRAVENOUS
  Administered 2019-09-25 – 2019-09-27 (×13): 1 mg via INTRAVENOUS
  Filled 2019-09-25 (×14): qty 1

## 2019-09-25 MED ORDER — INSULIN ASPART 100 UNIT/ML ~~LOC~~ SOLN
0.0000 [IU] | Freq: Three times a day (TID) | SUBCUTANEOUS | Status: DC
  Administered 2019-09-26: 4 [IU] via SUBCUTANEOUS
  Administered 2019-09-27: 1 [IU] via SUBCUTANEOUS
  Administered 2019-09-27: 3 [IU] via SUBCUTANEOUS
  Administered 2019-09-28: 2 [IU] via SUBCUTANEOUS
  Administered 2019-09-29: 1 [IU] via SUBCUTANEOUS
  Administered 2019-09-30: 3 [IU] via SUBCUTANEOUS

## 2019-09-25 MED ORDER — DEXTROSE-NACL 5-0.45 % IV SOLN: INTRAVENOUS | Status: DC

## 2019-09-25 MED ORDER — SODIUM CHLORIDE 0.9% FLUSH
10.0000 mL | Freq: Two times a day (BID) | INTRAVENOUS | Status: DC
  Administered 2019-09-25 – 2019-09-28 (×6): 10 mL

## 2019-09-25 MED ORDER — SODIUM CHLORIDE 0.9% FLUSH
10.0000 mL | INTRAVENOUS | Status: DC | PRN
  Administered 2019-09-27: 10 mL

## 2019-09-25 MED ORDER — HYDRALAZINE HCL 50 MG PO TABS
50.0000 mg | ORAL_TABLET | Freq: Four times a day (QID) | ORAL | Status: DC
  Administered 2019-09-25 – 2019-09-30 (×22): 50 mg via ORAL
  Filled 2019-09-25 (×2): qty 1
  Filled 2019-09-25: qty 2
  Filled 2019-09-25 (×13): qty 1
  Filled 2019-09-25: qty 2
  Filled 2019-09-25 (×6): qty 1

## 2019-09-25 MED ORDER — HYDROMORPHONE HCL 2 MG/ML IJ SOLN
INTRAMUSCULAR | Status: AC
  Filled 2019-09-25: qty 1

## 2019-09-25 MED ORDER — METOPROLOL TARTRATE 25 MG PO TABS
25.0000 mg | ORAL_TABLET | Freq: Two times a day (BID) | ORAL | Status: DC
  Administered 2019-09-25 – 2019-09-30 (×12): 25 mg via ORAL
  Filled 2019-09-25 (×14): qty 1

## 2019-09-25 MED ORDER — PROMETHAZINE HCL 25 MG/ML IJ SOLN
12.5000 mg | Freq: Four times a day (QID) | INTRAMUSCULAR | Status: DC
  Administered 2019-09-25 – 2019-09-30 (×22): 12.5 mg via INTRAVENOUS
  Filled 2019-09-25 (×22): qty 1

## 2019-09-25 MED ORDER — NORETHINDRONE 0.35 MG PO TABS: 1.0000 | ORAL_TABLET | Freq: Every day | ORAL | Status: DC

## 2019-09-25 MED ORDER — INSULIN ASPART 100 UNIT/ML ~~LOC~~ SOLN
5.0000 [IU] | Freq: Once | SUBCUTANEOUS | Status: AC
  Administered 2019-09-25: 5 [IU] via SUBCUTANEOUS

## 2019-09-25 MED ORDER — SUCRALFATE 1 GM/10ML PO SUSP
1.0000 g | Freq: Three times a day (TID) | ORAL | Status: DC
  Administered 2019-09-25 – 2019-09-30 (×21): 1 g via ORAL
  Filled 2019-09-25 (×21): qty 10

## 2019-09-25 NOTE — Progress Notes (Signed)
Inpatient Diabetes Program Recommendations  AACE/ADA: New Consensus Statement on Inpatient Glycemic Control (2015)  Target Ranges:  Prepandial:   less than 140 mg/dL      Peak postprandial:   less than 180 mg/dL (1-2 hours)      Critically ill patients:  140 - 180 mg/dL   Lab Results  Component Value Date   GLUCAP 161 (H) 09/25/2019   HGBA1C 8.3 (H) 09/25/2019    Review of Glycemic Control Results for [name], [name] (MRN [id number]) as of 09/25/2019 11:35  Ref. Range 09/25/2019 01:58 09/25/2019 03:16 09/25/2019 04:21 09/25/2019 08:04  Glucose-Capillary Latest Ref Range: 70 - 99 mg/dL 191 (H) 660 (H) 600 (H) 161 (H)   Diabetes history: DM 1 Outpatient Diabetes medications:  Lantus 10 units q HS, Humalog 1-9 units tid with meals Current orders for Inpatient glycemic control:  IV insulin-currently off  Inpatient Diabetes Program Recommendations:    It appears that insulin drip was turned off and basal insulin was not given.  Please add Lantus 6 units now and Novolog "very Sensitive" correction q 4 hours.   Thanks  Beryl Meager, RN, BC-ADM Inpatient Diabetes Coordinator Pager [phone] (8a-5p)

## 2019-09-25 NOTE — ED Notes (Signed)
I have just phoned report to [place], Therapist, [facility] on [address]. Will transport now.

## 2019-09-25 NOTE — Progress Notes (Signed)
COVID 19 positive PCR with recent positive test in Feb 2021. Asymptomatic from a COVID standpoint and with a cycle threshold of 41.8 indicating this is likely a recovered prior infection.  - Can discontinue airborne precautions  Kathryn Panda, DO [facility]

## 2019-09-25 NOTE — Progress Notes (Addendum)
PROGRESS NOTE    [name]    Code Status: Full Code  PZW:[id number] DOB: November 27, 1990 DOA: 09/24/2019 LOS: 1 days  PCP: [name], MD CC:  Chief Complaint  Patient presents with  . Emesis       Hospital Summary   This is a 29 year old female with a history of type 1 diabetes,/P cholecystectomy, hypertension, multiple ED visits and hospitalizations for cyclic nausea and vomiting with severe gastroparesis and positive gastric emptying studies in the past however had normal gastric emptying study in 09/06/2019 which made her not a candidate for a gastric pacemaker (initial plan from [place]) with concern for possible cannabinoid hyperemesis syndrome who presented with abdominal pain similar symptoms x2 days prior to admission.  Takes Bentyl, Maalox, Carafate, Phenergan, Protonix and Reglan at home. ED Course: In the ED she was given IV fluids IV Phenergan IV Reglan and her abdominal pain and vomiting continued.  She was noted to have a doubling of her serum creatinine and anion gap of 16.  Her CBG was 400 with a normal lipase.  Her BPs are noted to be markedly elevated which she associates with ongoing pain.  Also placed on insulin drip for hyperglycemia  Prior evaluation: Abnormal GES in the past c/w gastroparesis Colonoscopy 06/27/2017 - normal colon and ileum- normal random biopsies Flex sig 03/10/2017 - poor prep, normal colon with normal biopsies EGD 12/03/2011 - not completed due to retained food EGD 10/05/2017 - retained gastric fluid which limited some views of body / fundus, otherwise normal, no outlet obstruction  CT scan 03/23/2018 - distended stomach, mild transmural thickening of small and large bowel, mild hepatic steatosis CT angio scan 12/23/2017 - mild wall thickening of transverse colon, marked distension of bladder, body wall and mesenteric edema  CT renal stone protocol 05/31/19 - IMPRESSION: 1. Findings concerning for mild colitis. Clinical correlation is  recommended. No bowel obstruction. Normal appendix. 2. No hydronephrosis or nephrolithiasis.  09/06/2019 gastric emptying study: Negative  A & P   Active Problems:   Chronic abdominal pain   Hypertension associated with diabetes ([place])   Diabetes mellitus type 1 (HCC)   Protein-calorie malnutrition, severe (HCC)   MDD (major depressive disorder), recurrent episode, mild (HCC)   Intractable nausea and vomiting   GERD (gastroesophageal reflux disease)   Normocytic anemia   Cyclical vomiting syndrome   Cannabinoid hyperemesis syndrome   COVID-19 virus infection   Cyclic vomiting syndrome   1. Acute on chronic abdominal pain  Nausea/Vomiting, Unknown etiology with extensive work-up in the past.  a. DDx: cannabinoid hyperemesis syndrome vs type 1 diabetes gastroparesis vs. Abdominal Migraine? b. See above work-up for abdominal pain and Dr. Doyne Keel note from 08/11/2019 c. Has a history of cranial migraines in the past and states these resolved after starting her current medical regimen d. Vomiting gets worse when she smokes cannabis, though symptoms get better in hot showers e. If persistent symptoms will consult GI in a.m. f. Continue current regimen  2. Hyperglycemia with type 1 diabetes a. HA1C 8.3 b. Was briefly on insulin drip but did not receive subcu Lantus at discontinuation c. Had recurrent hypoglycemia which resolved with NovoLog 5 units d. Lantus 6 units daily with sensitive sliding scale  3. COVID-19 positive test a. COVID 19 positive PCR with recent positive test in Feb 2021. Asymptomatic from a COVID standpoint and with a cycle threshold of 41.8 indicating this is likely a recovered prior infection b. Can discontinue airborne precautions  4.  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 When AKI resolves she should start on ACE inhibitor b. Workup as below  5. AKI, improving a. Continue IV fluids  6. Chronically elevated total protein with  chronically low albumin, normocytic anemia and borderline elevated calcium corrected for albumin on recurrent labs as well as nephropathy a. Agree with SPEP/UPEP and free light chains - ordered  7. Normocytic anemia a. Prior unremarkable work-up b. SPEP/UPEP and free light chains as above  DVT prophylaxis: enoxaparin (LOVENOX) injection 40 mg Start: 09/25/19 1000   Family Communication: No family at bedside  Disposition Plan:  Status is: Inpatient  Remains inpatient appropriate because:IV treatments appropriate due to intensity of illness or inability to take PO and Inpatient level of care appropriate due to severity of illness   Dispo: The patient is from: Home              Anticipated d/c is to: Home              Anticipated d/c date is: 2 days              Patient currently is not medically stable to d/c.          Pressure injury documentation    None  Consultants  None  Procedures  None  Antibiotics   Anti-infectives (From admission, onward)   None        Subjective   Patient examined at bedside no acute distress resting comfortably.  States that she does smoke cannabis but when she is having abdominal pain and nausea and vomiting this makes her symptoms worse, however they do improve when she is in a hot shower.  States that she eats small meals throughout the day and is tolerating her soft diet currently.  Follows with Dr. Adela Lank with our GI.  Currently starting to get abdominal pain.  No other issues or overnight events.  Objective   Vitals:   09/25/19 0719 09/25/19 1013 09/25/19 1043 09/25/19 1405  BP:  126/84 (!) 162/106 (!) 162/97  Pulse: 85 99 99 91  Resp:  18 18 17   Temp:  98.5 F (36.9 C) 98.4 F (36.9 C) 98.1 F (36.7 C)  TempSrc:  Oral Oral   SpO2: 100% 100% 100% 100%    Intake/Output Summary (Last 24 hours) at 09/25/2019 1715 Last data filed at 09/25/2019 1540 Gross per 24 hour  Intake 2058.88 ml  Output -  Net 2058.88 ml    There were no vitals filed for this visit.  Examination:  Physical Exam Vitals and nursing note reviewed.  Constitutional:      Appearance: Normal appearance.  HENT:     Head: Normocephalic and atraumatic.  Eyes:     Conjunctiva/sclera: Conjunctivae normal.  Cardiovascular:     Rate and Rhythm: Normal rate and regular rhythm.  Pulmonary:     Effort: Pulmonary effort is normal.     Breath sounds: Normal breath sounds.  Abdominal:     General: Abdomen is flat.     Tenderness: There is abdominal tenderness.  Musculoskeletal:        General: No swelling or tenderness.  Skin:    Coloration: Skin is not jaundiced or pale.  Neurological:     Mental Status: She is alert. Mental status is at baseline.  Psychiatric:        Mood and Affect: Mood normal.        Behavior: Behavior normal.     Data Reviewed: I have personally reviewed  following labs and imaging studies  CBC: Recent Labs  Lab 09/24/19 0141 09/24/19 1630 09/25/19 0438  WBC 4.6 5.9 5.9  NEUTROABS  --  4.3  --   HGB 9.9* 9.6* 8.6*  HCT 31.9* 31.0* 27.3*  MCV 83.5 82.2 81.7  PLT 224 231 214   Basic Metabolic Panel: Recent Labs  Lab 09/24/19 0141 09/24/19 1630 09/25/19 0428  NA 137 136 138  K 4.2 4.0 3.8  CL 102 94* 100  CO2 20* 26 24  GLUCOSE 239* 400* 93  BUN 26* 37* 37*  CREATININE 1.21* 1.94* 1.49*  CALCIUM 9.3 9.0 8.5*   GFR: Estimated Creatinine Clearance: 47.4 mL/min (A) (by C-G formula based on SCr of 1.49 mg/dL (H)). Liver Function Tests: Recent Labs  Lab 09/24/19 0141 09/24/19 1630 09/25/19 0428  AST 33 25 28  ALT 37 35 31  ALKPHOS 97 101 87  BILITOT 0.7 0.6 0.4  PROT 8.5* 8.9* 8.0  ALBUMIN 3.2* 3.3* 2.9*   Recent Labs  Lab 09/24/19 0141 09/24/19 1630  LIPASE 23 21   No results for input(s): AMMONIA in the last 168 hours. Coagulation Profile: No results for input(s): INR, PROTIME in the last 168 hours. Cardiac Enzymes: No results for input(s): CKTOTAL, CKMB, CKMBINDEX,  TROPONINI in the last 168 hours. BNP (last 3 results) No results for input(s): PROBNP in the last 8760 hours. HbA1C: Recent Labs    09/25/19 0438  HGBA1C 8.3*   CBG: Recent Labs  Lab 09/25/19 0316 09/25/19 0421 09/25/19 0804 09/25/19 1218 09/25/19 1642  GLUCAP 208* 121* 161* 273* 75   Lipid Profile: No results for input(s): CHOL, HDL, LDLCALC, TRIG, CHOLHDL, LDLDIRECT in the last 72 hours. Thyroid Function Tests: No results for input(s): TSH, T4TOTAL, FREET4, T3FREE, THYROIDAB in the last 72 hours. Anemia Panel: No results for input(s): VITAMINB12, FOLATE, FERRITIN, TIBC, IRON, RETICCTPCT in the last 72 hours. Sepsis Labs: No results for input(s): PROCALCITON, LATICACIDVEN in the last 168 hours.  Recent Results (from the past 240 hour(s))  SARS Coronavirus 2 by RT PCR (hospital order, performed in [facility] hospital lab) Nasopharyngeal Nasopharyngeal Swab     Status: Abnormal   Collection Time: 09/24/19  6:18 PM   Specimen: Nasopharyngeal Swab  Result Value Ref Range Status   SARS Coronavirus 2 POSITIVE (A) NEGATIVE Final    Comment: RESULT CALLED TO, READ BACK BY AND VERIFIED WITH: J.Princella Ion [id number] @2030  BY V.WILKINS (NOTE) SARS-CoV-2 target nucleic acids are DETECTED  SARS-CoV-2 RNA is generally detectable in upper respiratory specimens  during the acute phase of infection.  Positive results are indicative  of the presence of the identified virus, but do not rule out bacterial infection or co-infection with other pathogens not detected by the test.  Clinical correlation with patient history and  other diagnostic information is necessary to determine patient infection status.  The expected result is negative.  Fact Sheet for Patients:     Fact Sheet for Healthcare Providers:   BoilerBrush.com.cy    This test is not yet approved or cleared by the https://pope.com/ FDA and  has been authorized  for detection and/or diagnosis of SARS-CoV-2 by FDA under an Emergency Use Authorization (EUA).  This EUA will remain in effect (meaning  this test can be used) for the duration of  the COVID-19 declaration under Section 564(b)(1) of the Act, 21 U.S.C. section 360-bbb-3(b)(1), unless the authorization is terminated or revoked sooner.  Performed at [facility], [address] [place],  [place] [address]          Radiology Studies: No results found.      Scheduled Meds: . capsaicin   Topical BID  . enoxaparin (LOVENOX) injection  40 mg Subcutaneous Q24H  . furosemide  40 mg Oral Daily  . hydrALAZINE  50 mg Oral Q6H  . insulin aspart  0-6 Units Subcutaneous TID WC  . insulin glargine  6 Units Subcutaneous Daily  . metoCLOPramide (REGLAN) injection  10 mg Intravenous Q8H  . metoprolol tartrate  25 mg Oral BID  . mirtazapine  30 mg Oral QHS  . norethindrone  1 tablet Oral Daily  . ondansetron (ZOFRAN) IV  4 mg Intravenous Q6H  . pantoprazole (PROTONIX) IV  40 mg Intravenous Q12H  . potassium chloride  10 mEq Oral Daily  . promethazine  12.5 mg Intravenous Q6H  . sodium chloride flush  10-40 mL Intracatheter Q12H  . sucralfate  1 g Oral TID WC & HS   Continuous Infusions: . sodium chloride Stopped (09/25/19 0319)  . dextrose 5 % and 0.45% NaCl 75 mL/hr at 09/25/19 1540  . insulin Stopped (09/25/19 0425)     Time spent: 33 minutes with over 50% of the time coordinating the patient's care    Jae Dire, DO [facility] Pager [phone]  Call night coverage person covering after 7pm

## 2019-09-26 DIAGNOSIS — R1115 Cyclical vomiting syndrome unrelated to migraine: Secondary | ICD-10-CM

## 2019-09-26 LAB — MAGNESIUM: Magnesium: 1.6 mg/dL — ABNORMAL LOW (ref 1.7–2.4)

## 2019-09-26 LAB — PROTEIN ELECTROPHORESIS, SERUM
A/G Ratio: 0.7 (ref 0.7–1.7)
Albumin ELP: 2.9 g/dL (ref 2.9–4.4)
Alpha-1-Globulin: 0.3 g/dL (ref 0.0–0.4)
Alpha-2-Globulin: 1.1 g/dL — ABNORMAL HIGH (ref 0.4–1.0)
Beta Globulin: 1.3 g/dL (ref 0.7–1.3)
Gamma Globulin: 1.7 g/dL (ref 0.4–1.8)
Globulin, Total: 4.3 g/dL — ABNORMAL HIGH (ref 2.2–3.9)
Total Protein ELP: 7.2 g/dL (ref 6.0–8.5)

## 2019-09-26 LAB — BASIC METABOLIC PANEL
Anion gap: 13 (ref 5–15)
BUN: 48 mg/dL — ABNORMAL HIGH (ref 6–20)
CO2: 22 mmol/L (ref 22–32)
Calcium: 8.3 mg/dL — ABNORMAL LOW (ref 8.9–10.3)
Chloride: 96 mmol/L — ABNORMAL LOW (ref 98–111)
Creatinine, Ser: 2.02 mg/dL — ABNORMAL HIGH (ref 0.44–1.00)
GFR calc Af Amer: 37 mL/min — ABNORMAL LOW (ref >60)
GFR calc non Af Amer: 32 mL/min — ABNORMAL LOW (ref >60)
Glucose, Bld: 308 mg/dL — ABNORMAL HIGH (ref 70–99)
Potassium: 3.6 mmol/L (ref 3.5–5.1)
Sodium: 131 mmol/L — ABNORMAL LOW (ref 135–145)

## 2019-09-26 LAB — CBC
HCT: 27.2 % — ABNORMAL LOW (ref 36.0–46.0)
Hemoglobin: 8.4 g/dL — ABNORMAL LOW (ref 12.0–15.0)
MCH: 26 pg (ref 26.0–34.0)
MCHC: 30.9 g/dL (ref 30.0–36.0)
MCV: 84.2 fL (ref 80.0–100.0)
Platelets: 222 10*3/uL (ref 150–400)
RBC: 3.23 MIL/uL — ABNORMAL LOW (ref 3.87–5.11)
RDW: 14.6 % (ref 11.5–15.5)
WBC: 8.3 10*3/uL (ref 4.0–10.5)
nRBC: 0 % (ref 0.0–0.2)

## 2019-09-26 LAB — GLUCOSE, CAPILLARY
Glucose-Capillary: 318 mg/dL — ABNORMAL HIGH (ref 70–99)
Glucose-Capillary: 89 mg/dL (ref 70–99)
Glucose-Capillary: 80 mg/dL (ref 70–99)
Glucose-Capillary: 98 mg/dL (ref 70–99)

## 2019-09-26 MED ORDER — INSULIN ASPART 100 UNIT/ML ~~LOC~~ SOLN
2.0000 [IU] | Freq: Three times a day (TID) | SUBCUTANEOUS | Status: DC
  Administered 2019-09-27 (×2): 2 [IU] via SUBCUTANEOUS

## 2019-09-26 MED ORDER — SUMATRIPTAN SUCCINATE 25 MG PO TABS
25.0000 mg | ORAL_TABLET | Freq: Once | ORAL | Status: AC
  Administered 2019-09-26: 25 mg via ORAL
  Filled 2019-09-26: qty 1

## 2019-09-26 MED ORDER — AMLODIPINE BESYLATE 5 MG PO TABS
5.0000 mg | ORAL_TABLET | Freq: Every day | ORAL | Status: DC
  Administered 2019-09-26 – 2019-09-30 (×5): 5 mg via ORAL
  Filled 2019-09-26 (×5): qty 1

## 2019-09-26 MED ORDER — LACTATED RINGERS IV SOLN: INTRAVENOUS | Status: AC

## 2019-09-26 MED ORDER — INSULIN GLARGINE 100 UNIT/ML ~~LOC~~ SOLN
8.0000 [IU] | Freq: Every day | SUBCUTANEOUS | Status: DC
  Administered 2019-09-27 – 2019-09-30 (×4): 8 [IU] via SUBCUTANEOUS
  Filled 2019-09-26 (×4): qty 0.08

## 2019-09-26 MED ORDER — SUMATRIPTAN SUCCINATE 25 MG PO TABS: 25.0000 mg | ORAL_TABLET | Freq: Once | ORAL | Status: DC

## 2019-09-26 MED ORDER — MAGNESIUM SULFATE 2 GM/50ML IV SOLN
2.0000 g | Freq: Once | INTRAVENOUS | Status: AC
  Administered 2019-09-26: 2 g via INTRAVENOUS
  Filled 2019-09-26: qty 50

## 2019-09-26 MED ORDER — SODIUM CHLORIDE 0.9 % IV SOLN
INTRAVENOUS | Status: DC | PRN
  Administered 2019-09-26: 500 mL via INTRAVENOUS
  Administered 2019-09-28: 1000 mL via INTRAVENOUS

## 2019-09-26 NOTE — Progress Notes (Addendum)
PROGRESS NOTE    [name]    Code Status: Full Code  MBE:[id number] DOB: August 29, 1990 DOA: 09/24/2019 LOS: 2 days  PCP: [name], MD CC:  Chief Complaint  Patient presents with  . Emesis       Hospital Summary   This is a 29 year old female with a history of type 1 diabetes,/P cholecystectomy, hypertension, multiple ED visits and hospitalizations for cyclic nausea and vomiting with severe gastroparesis and positive gastric emptying studies in the past however had normal gastric emptying study in 09/06/2019 which made her not a candidate for a gastric pacemaker (initial plan from [place]) with concern for possible cannabinoid hyperemesis syndrome who presented with abdominal pain similar symptoms x2 days prior to admission.  Takes Bentyl, Maalox, Carafate, Phenergan, Protonix and Reglan at home. ED Course: In the ED she was given IV fluids IV Phenergan IV Reglan and her abdominal pain and vomiting continued.  She was noted to have a doubling of her serum creatinine and anion gap of 16.  Her CBG was 400 with a normal lipase.  Her BPs are noted to be markedly elevated which she associates with ongoing pain.  Also placed on insulin drip for hyperglycemia  Prior evaluation:  Abnormal GES in the past c/w gastroparesis  Colonoscopy 06/27/2017 - normal colon and ileum- normal random biopsies  Flex sig 03/10/2017 - poor prep, normal colon with normal biopsies  EGD 12/03/2011 - not completed due to retained food  EGD 10/05/2017 - retained gastric fluid which limited some views of body / fundus, otherwise normal, no outlet obstruction  CT scan 03/23/2018 - distended stomach, mild transmural thickening of small and large bowel, mild hepatic steatosis  CT angio scan 12/23/2017 - mild wall thickening of transverse colon, marked distension of bladder, body wall and mesenteric edema  CT renal stone protocol 05/31/19 - IMPRESSION:  1. Findings concerning for mild colitis. Clinical correlation  is  recommended. No bowel obstruction. Normal appendix.  2. No hydronephrosis or nephrolithiasis.  09/06/2019 gastric emptying study: Negative  6/28: Sumatriptan 25 mg x 1 trial, no improvement   A & P   Active Problems:   Chronic abdominal pain   Hypertension associated with diabetes ([place])   Diabetes mellitus type 1 (HCC)   Protein-calorie malnutrition, severe (HCC)   MDD (major depressive disorder), recurrent episode, mild (HCC)   Intractable nausea and vomiting   GERD (gastroesophageal reflux disease)   Normocytic anemia   Cyclical vomiting syndrome   Cannabinoid hyperemesis syndrome   COVID-19 virus infection   Cyclic vomiting syndrome   1. Acute on chronic abdominal pain  Nausea/Vomiting, Unknown etiology with extensive work-up in the past.  a. DDx: cannabinoid hyperemesis syndrome vs type 1 diabetes gastroparesis vs. Abdominal Migraine? b. See above work-up for abdominal pain and Dr. Doyne Keel note from 08/11/2019 c. Has a history of cranial migraines in the past and states these resolved after starting her current medical regimen - discussed with GI over the phone today d. Vomiting gets worse when she smokes cannabis, though symptoms get better in hot showers e. Trial of low-dose sumatriptan 25 mg x 1 today did not improve symptoms f. If persistent symptoms will consult GI in a.m.  2. Hyperglycemia with type 1 diabetes a. HA1C 8.3 b. Was briefly on insulin drip but did not receive subcu Lantus at discontinuation c. Had recurrent hypoglycemia which resolved with NovoLog 5 units d. Lantus 8 units daily, add NovoLog 2 units 3 times daily with meals and  continue with sensitive sliding scale per diabetic coordinator recommendations  3. COVID-19 positive test a. COVID 19 positive PCR with recent positive test in Feb 2021. Asymptomatic from a COVID standpoint and with a cycle threshold of 41.8 indicating this is likely a recovered prior infection b. Off airborne  precautions  4. Thoracolumbar Back pain a. Point tenderness around T6/T7 and L1 spinous proceses as well as paraspinal tenderness to palpation b. If no improvement could consider thoracolumbar x-ray  5. 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 When AKI resolves she should start on ACE inhibitor b. Workup as below  6. Hypertension a. Continue metoprolol b. Add amlodipine  7. AKI,  a. Worse today, probably from nausea and vomiting b. Continue IV fluids  8. Chronically elevated total protein with chronically low albumin, normocytic anemia and borderline elevated calcium corrected for albumin on recurrent labs as well as nephropathy a. Agree with SPEP/UPEP and free light chains - ordered  9. Normocytic anemia a. Prior unremarkable work-up b. SPEP/UPEP and free light chains as above  DVT prophylaxis: enoxaparin (LOVENOX) injection 40 mg Start: 09/25/19 1000   Family Communication: No family at bedside  Disposition Plan: she has to tolerate p.o. intake as well as improved AKI Status is: Inpatient  Remains inpatient appropriate because:IV treatments appropriate due to intensity of illness or inability to take PO and Inpatient level of care appropriate due to severity of illness   Dispo: The patient is from: Home              Anticipated d/c is to: Home              Anticipated d/c date is: 2 days              Patient currently is not medically stable to d/c.          Pressure injury documentation    None  Consultants  None  Procedures  None  Antibiotics   Anti-infectives (From admission, onward)   None        Subjective   Still with abdominal pain.  No history of stroke or cardiovascular disease, never taken medication for migraine.  Discussed with pharmacy regarding contraindications to triptan therapy.  Started on low-dose triptan this a.m. also admits to some back pain.  Denies any known family history of bone malignancy, multiple  myeloma or amyloidosis.  I followed up with the patient later this afternoon was sleeping.  When asked if she still had abdominal pain she admitted to continued abdominal pain  Objective   Vitals:   09/25/19 2122 09/26/19 0120 09/26/19 0453 09/26/19 1427  BP: (!) 138/104 (!) 142/105 (!) 136/91 (!) 148/105  Pulse: 87 86 86 82  Resp: 16 16 18 15   Temp: 98.1 F (36.7 C) 98 F (36.7 C) 98 F (36.7 C) 97.9 F (36.6 C)  TempSrc:  Oral Oral   SpO2: 100% 100% 100% 100%  Weight:      Height:        Intake/Output Summary (Last 24 hours) at 09/26/2019 1533 Last data filed at 09/26/2019 0600 Gross per 24 hour  Intake 1919.7 ml  Output 1000 ml  Net 919.7 ml   Filed Weights   09/25/19 1732  Weight: 54.4 kg    Examination:  Physical Exam Vitals and nursing note reviewed.  Constitutional:      Appearance: Normal appearance.     Comments: Does not appear to be in much pain  HENT:     Head: Normocephalic  and atraumatic.  Eyes:     Conjunctiva/sclera: Conjunctivae normal.  Cardiovascular:     Rate and Rhythm: Normal rate and regular rhythm.  Pulmonary:     Effort: Pulmonary effort is normal.     Breath sounds: Normal breath sounds.  Abdominal:     General: Abdomen is flat.     Palpations: Abdomen is soft.     Tenderness: There is generalized abdominal tenderness.  Musculoskeletal:        General: No swelling or tenderness.       Arms:  Skin:    Coloration: Skin is not jaundiced or pale.  Neurological:     Mental Status: She is alert. Mental status is at baseline.  Psychiatric:        Mood and Affect: Mood normal.        Behavior: Behavior normal.     Data Reviewed: I have personally reviewed following labs and imaging studies  CBC: Recent Labs  Lab 09/24/19 0141 09/24/19 1630 09/25/19 0438 09/26/19 0335  WBC 4.6 5.9 5.9 8.3  NEUTROABS  --  4.3  --   --   HGB 9.9* 9.6* 8.6* 8.4*  HCT 31.9* 31.0* 27.3* 27.2*  MCV 83.5 82.2 81.7 84.2  PLT 224 231 214 094    Basic Metabolic Panel: Recent Labs  Lab 09/24/19 0141 09/24/19 1630 09/25/19 0428 09/26/19 0335  NA 137 136 138 131*  K 4.2 4.0 3.8 3.6  CL 102 94* 100 96*  CO2 20* 26 24 22   GLUCOSE 239* 400* 93 308*  BUN 26* 37* 37* 48*  CREATININE 1.21* 1.94* 1.49* 2.02*  CALCIUM 9.3 9.0 8.5* 8.3*  MG  --   --   --  1.6*   GFR: Estimated Creatinine Clearance: 35 mL/min (A) (by C-G formula based on SCr of 2.02 mg/dL (H)). Liver Function Tests: Recent Labs  Lab 09/24/19 0141 09/24/19 1630 09/25/19 0428  AST 33 25 28  ALT 37 35 31  ALKPHOS 97 101 87  BILITOT 0.7 0.6 0.4  PROT 8.5* 8.9* 8.0  ALBUMIN 3.2* 3.3* 2.9*   Recent Labs  Lab 09/24/19 0141 09/24/19 1630  LIPASE 23 21   No results for input(s): AMMONIA in the last 168 hours. Coagulation Profile: No results for input(s): INR, PROTIME in the last 168 hours. Cardiac Enzymes: No results for input(s): CKTOTAL, CKMB, CKMBINDEX, TROPONINI in the last 168 hours. BNP (last 3 results) No results for input(s): PROBNP in the last 8760 hours. HbA1C: Recent Labs    09/25/19 0438  HGBA1C 8.3*   CBG: Recent Labs  Lab 09/25/19 1218 09/25/19 1642 09/25/19 2205 09/26/19 0745 09/26/19 1150  GLUCAP 273* 75 167* 318* 89   Lipid Profile: No results for input(s): CHOL, HDL, LDLCALC, TRIG, CHOLHDL, LDLDIRECT in the last 72 hours. Thyroid Function Tests: No results for input(s): TSH, T4TOTAL, FREET4, T3FREE, THYROIDAB in the last 72 hours. Anemia Panel: No results for input(s): VITAMINB12, FOLATE, FERRITIN, TIBC, IRON, RETICCTPCT in the last 72 hours. Sepsis Labs: No results for input(s): PROCALCITON, LATICACIDVEN in the last 168 hours.  Recent Results (from the past 240 hour(s))  SARS Coronavirus 2 by RT PCR (hospital order, performed in [facility] hospital lab) Nasopharyngeal Nasopharyngeal Swab     Status: Abnormal   Collection Time: 09/24/19  6:18 PM   Specimen: Nasopharyngeal Swab  Result Value Ref Range Status   SARS  Coronavirus 2 POSITIVE (A) NEGATIVE Final    Comment: RESULT CALLED TO, READ BACK BY AND VERIFIED WITH:  J.Everlean Patterson [id number] @2030  BY V.WILKINS (NOTE) SARS-CoV-2 target nucleic acids are DETECTED  SARS-CoV-2 RNA is generally detectable in upper respiratory specimens  during the acute phase of infection.  Positive results are indicative  of the presence of the identified virus, but do not rule out bacterial infection or co-infection with other pathogens not detected by the test.  Clinical correlation with patient history and  other diagnostic information is necessary to determine patient infection status.  The expected result is negative.  Fact Sheet for Patients:   StrictlyIdeas.no   Fact Sheet for Healthcare Providers:   BankingDealers.co.za    This test is not yet approved or cleared by the [place] FDA and  has been authorized for detection and/or diagnosis of SARS-CoV-2 by FDA under an Emergency Use Authorization (EUA).  This EUA will remain in effect (meaning  this test can be used) for the duration of  the COVID-19 declaration under Section 564(b)(1) of the Act, 21 U.S.C. section 360-bbb-3(b)(1), unless the authorization is terminated or revoked sooner.  Performed at [facility], [place] [address] [place],  [address]          Radiology Studies: No results found.      Scheduled Meds: . capsaicin   Topical BID  . enoxaparin (LOVENOX) injection  40 mg Subcutaneous Q24H  . furosemide  40 mg Oral Daily  . hydrALAZINE  50 mg Oral Q6H  . insulin aspart  0-6 Units Subcutaneous TID WC  . insulin glargine  6 Units Subcutaneous Daily  . metoCLOPramide (REGLAN) injection  10 mg Intravenous Q8H  . metoprolol tartrate  25 mg Oral BID  . mirtazapine  30 mg Oral QHS  . norethindrone  1 tablet Oral Daily  . ondansetron (ZOFRAN) IV  4 mg Intravenous Q6H  . pantoprazole (PROTONIX) IV  40 mg  Intravenous Q12H  . potassium chloride  10 mEq Oral Daily  . promethazine  12.5 mg Intravenous Q6H  . sodium chloride flush  10-40 mL Intracatheter Q12H  . sucralfate  1 g Oral TID WC & HS   Continuous Infusions: . sodium chloride Stopped (09/25/19 0319)  . dextrose 5 % and 0.45% NaCl Stopped (09/26/19 0901)  . insulin Stopped (09/25/19 0425)  . lactated ringers 100 mL/hr at 09/26/19 0902     Time spent: 36 minutes with over 50% of the time coordinating the patient's care    Harold Hedge, DO [facility] Pager [phone]  Call night coverage person covering after 7pm

## 2019-09-26 NOTE — TOC Initial Note (Addendum)
Transition of Care [facility]) - Initial/Assessment Note    Patient Details  Name: [name] MRN: [id number] Date of Birth: October 31, 1990  Transition of Care [facility]) CM/SW Contact:    Lia Hopping, [place] Phone Number: 09/26/2019, 3:08 PM  Clinical Narrative:                 Readmission Assessment completed. Patient has a PCP-Dr. Dimas Chyle. She has an appointment with her physician every 2-3 months. Patient reports she plans to make a follow up appointment at [facility] for a "gastro pacemaker." Patient reports she lives at home with her spouse and minor daughter who are supportive.  Patient reports she does not have transportation during the day, but takes an Melburn Popper to her scheduled appointments. Patient has no concerns with affording her medications co-pays.  No TOC needs identified at this time.    Expected Discharge Plan: Home/Self Care Barriers to Discharge: No Barriers Identified   Patient Goals and CMS Choice     Choice offered to / list presented to : NA  Expected Discharge Plan and Services Expected Discharge Plan: Home/Self Care In-house Referral: NA Discharge Planning Services: NA Post Acute Care Choice: NA Living arrangements for the past 2 months: Single Family Home                 DME Arranged: N/A DME Agency: NA       HH Arranged: NA [place] Agency: NA        Prior Living Arrangements/Services Living arrangements for the past 2 months: Single Family Home Lives with:: Spouse, Minor Children Patient language and need for interpreter reviewed:: No Do you feel safe going back to the place where you live?: Yes      Need for Family Participation in Patient Care: Yes (Comment) Care giver support system in place?: Yes (comment)   Criminal Activity/Legal Involvement Pertinent to Current Situation/Hospitalization: No - Comment as needed  Activities of Daily Living Home Assistive Devices/Equipment: Dentures (specify type) (full upper & lower) ADL Screening (condition  at time of admission) Patient's cognitive ability adequate to safely complete daily activities?: Yes Is the patient deaf or have difficulty hearing?: No Does the patient have difficulty seeing, even when wearing glasses/contacts?: No Does the patient have difficulty concentrating, remembering, or making decisions?: No Patient able to express need for assistance with ADLs?: Yes Does the patient have difficulty dressing or bathing?: No Independently performs ADLs?: Yes (appropriate for developmental age) Does the patient have difficulty walking or climbing stairs?: No Weakness of Legs: None Weakness of Arms/Hands: None  Permission Sought/Granted   Permission granted to share information with : Yes, Verbal Permission Granted              Emotional Assessment Appearance:: Appears stated age Attitude/Demeanor/Rapport: Engaged Affect (typically observed): Accepting Orientation: : Oriented to Self, Oriented to Place, Oriented to  Time, Oriented to Situation Alcohol / Substance Use: Not Applicable Psych Involvement: No (comment)  Admission diagnosis:  Cyclic vomiting syndrome [R11.15] AKI (acute kidney injury) ([place]) [N17.9] Intractable vomiting with nausea, unspecified vomiting type [R11.2] Patient Active Problem List   Diagnosis Date Noted  . Cyclic vomiting syndrome 09/24/2019  . COVID-19 virus infection 05/31/2019  . Hypertensive urgency 04/30/2019  . Cannabinoid hyperemesis syndrome 04/30/2019  . Contraception management 08/06/2018  . Cyclical vomiting syndrome 06/07/2018  . Diabetic gastroparesis associated with type 1 diabetes mellitus ([place]) 06/04/2018  . Anxiety 05/31/2018  . Peripheral edema 05/31/2018  . Normocytic anemia 07/10/2017  . GERD (  gastroesophageal reflux disease) 06/23/2017  . Intractable nausea and vomiting 04/11/2017  . MDD (major depressive disorder), recurrent episode, mild ([place]) 02/28/2017  . Diabetes mellitus type 1 ([place]) 04/28/2013  . Protein-calorie  malnutrition, severe ([place]) 04/28/2013  . Hypertension associated with diabetes ([place]) 11/14/2011  . Chronic abdominal pain 09/18/2011   PCP:  [name], MD Pharmacy:   [facility] [place], [address] [place] [address] Phone: [phone] Fax: [phone]  [place], [address], [address] [place], [place] [address] Phone: [phone] Fax: [place], [address]. [place]. [place] [address] Phone: [phone] Fax: [phone]     Social Determinants of Health (SDOH) Interventions    Readmission Risk Interventions Readmission Risk Prevention Plan 09/26/2019 07/30/2018 06/09/2018  Transportation Screening Complete Complete Complete  PCP or Specialist Appt within 3-5 Days - Complete -  HRI or [place] - Not Complete -  HRI or Home Care Consult comments - NA -  Social Work Consult for [place] Planning/Counseling - Not Complete -  SW consult not completed comments - NA -  Palliative Care Screening - Not Applicable -  Medication Review Press photographer) - (No Data) Complete  PCP or Specialist appointment within 3-5 days of discharge Complete - Complete  HRI or Home Care Consult Complete - Complete  SW Recovery Care/Counseling Consult Complete - Complete  Palliative Care Screening Not Applicable - Not [place] Not Applicable - Not Applicable  Some recent data might be hidden

## 2019-09-26 NOTE — Evaluation (Signed)
Physical Therapy Evaluation Only Patient Details Name: [name] MRN: [id number] DOB: 12/05/1990 Today's Date: 09/26/2019   History of Present Illness  29 year old female with a history of type 1 diabetes,/P cholecystectomy, hypertension, multiple ED visits and hospitalizations for cyclic nausea and vomiting with severe gastroparesis and positive gastric emptying studies in the past however had normal gastric emptying study in 09/06/2019 which made her not a candidate for a gastric pacemaker (initial plan from [facility]) with concern for possible cannabinoid hyperemesis syndrome who presented with abdominal pain similar symptoms x2 days prior to admission.  Clinical Impression  Physical therapy evaluation completed, patient is at baseline and no further PT services recommended at this time. Pt reports previous success in OPPT after last hospitalization and is interested in strengthening again at [place] clinic after d/c. Patient discharged to care of nursing, may amb with nursing and family daily as tolerated for length of stay.     Follow Up Recommendations Outpatient PT (previous success at [place] [facility])    Equipment Recommendations  None recommended by PT    Recommendations for Other Services       Precautions / Restrictions Precautions Precautions: None Restrictions Weight Bearing Restrictions: No      Mobility  Bed Mobility  General bed mobility comments: seated at EOB upon arrival  Transfers Overall transfer level: Independent Equipment used: None  General transfer comment: rises without UE assisting, good steadiness  Ambulation/Gait Ambulation/Gait assistance: Independent Gait Distance (Feet): 360 Feet Assistive device: None Gait Pattern/deviations: WFL(Within Functional Limits) Gait velocity: WFL   General Gait Details: pt ambulates around room and in hallway without deficits, passes obstacles at safe distance, turns and changes  speeds without deficits, no unsteadiness or LOB  Stairs            Wheelchair Mobility    Modified Rankin (Stroke Patients Only)       Balance Overall balance assessment: Independent                Pertinent Vitals/Pain Pain Assessment: 0-10 Pain Score: 7  Pain Location: back apin Pain Descriptors / Indicators: Aching;Sore Pain Intervention(s): Limited activity within patient's tolerance;Monitored during session    [place] expects to be discharged to:: Private residence Living Arrangements: Children;Spouse/significant other Available Help at Discharge: Family;Available 24 hours/day Type of Home: House Home Access: Level entry     Home Layout: One level Home Equipment: None      Prior Function Level of Independence: Independent         Comments: Pt is Ind with ADLs, household chores, drives, 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     Hand Dominance   Dominant Hand: Right    Extremity/Trunk Assessment   Upper Extremity Assessment Upper Extremity Assessment: Overall WFL for tasks assessed    Lower Extremity Assessment Lower Extremity Assessment: Overall WFL for tasks assessed (AROM WNL, strength 4+/5)    Cervical / Trunk Assessment Cervical / Trunk Assessment: Normal  Communication   Communication: No difficulties  Cognition Arousal/Alertness: Awake/alert Behavior During Therapy: WFL for tasks assessed/performed Overall Cognitive Status: Within Functional Limits for tasks assessed       General Comments      Exercises     Assessment/Plan    PT Assessment All further PT needs can be met in the next venue of care  PT Problem List Decreased activity tolerance  PT Treatment Interventions      PT Goals (Current goals can be found in the Care Plan section)  Acute Rehab PT Goals Patient Stated Goal: "some outpatient therapy" PT Goal Formulation: With  patient Time For Goal Achievement: 10/03/19 Potential to Achieve Goals: Good    Frequency     Barriers to discharge        Co-evaluation               AM-PAC PT "6 Clicks" Mobility  Outcome Measure Help needed turning from your back to your side while in a flat bed without using bedrails?: None Help needed moving from lying on your back to sitting on the side of a flat bed without using bedrails?: None Help needed moving to and from a bed to a chair (including a wheelchair)?: None Help needed standing up from a chair using your arms (e.g., wheelchair or bedside chair)?: None Help needed to walk in hospital room?: None Help needed climbing 3-5 steps with a railing? : None 6 Click Score: 24    End of Session   Activity Tolerance: Patient tolerated treatment well Patient left: in bed;with call bell/phone within reach Nurse Communication: Mobility status PT Visit Diagnosis: Other abnormalities of gait and mobility (R26.89)    Time: 7471-8550 PT Time Calculation (min) (ACUTE ONLY): 12 min   Charges:   PT Evaluation $PT Eval Low Complexity: 1 Low           [name] PT, DPT 09/26/19, 9:44 AM

## 2019-09-26 NOTE — Progress Notes (Signed)
Inpatient Diabetes Program Recommendations  AACE/ADA: New Consensus Statement on Inpatient Glycemic Control (2015)  Target Ranges:  Prepandial:   less than 140 mg/dL      Peak postprandial:   less than 180 mg/dL (1-2 hours)      Critically ill patients:  140 - 180 mg/dL   Lab Results  Component Value Date   GLUCAP 89 09/26/2019   HGBA1C 8.3 (H) 09/25/2019    Review of Glycemic Control  Diabetes history: Type 1 Outpatient Diabetes medications:  Current orders for Inpatient glycemic control: Lantus 6 units QD, Novolog 0-6 units tidwc  Pt previously had insulin pump and it malfunctioned. Has Endo appt with Dr [name] on July 9th and is hoping to get back on the pump.   HgbA1C - 8.3%  Inpatient Diabetes Program Recommendations:     Increase Lantus to 8 units QD When eating regular meal, will need Novolog 2 units tidwc for meal coverage since she's Type 1 and makes no insulin.  Spoke with pt at bedside regarding her diabetes control. Discussed HgbA1C of 8.3%. Pt states she is hoping to get back on insulin pump when she sees Endo on 10/07/19. Pt states she gets scared her blood sugars will drop when she is vomiting, so does not take insulin. Long discussion about need for basal insulin and use continuous glucose monitor to closely watch her glucose trends.   Will f/u in am.  Thank you. Lorenda Peck, RD, LDN, CDE Inpatient Diabetes Coordinator [phone]

## 2019-09-27 ENCOUNTER — Inpatient Hospital Stay (HOSPITAL_COMMUNITY): Payer: 59

## 2019-09-27 LAB — CBC
HCT: 25.8 % — ABNORMAL LOW (ref 36.0–46.0)
Hemoglobin: 8.2 g/dL — ABNORMAL LOW (ref 12.0–15.0)
MCH: 25.9 pg — ABNORMAL LOW (ref 26.0–34.0)
MCHC: 31.8 g/dL (ref 30.0–36.0)
MCV: 81.6 fL (ref 80.0–100.0)
Platelets: 189 10*3/uL (ref 150–400)
RBC: 3.16 MIL/uL — ABNORMAL LOW (ref 3.87–5.11)
RDW: 14.3 % (ref 11.5–15.5)
WBC: 6.2 10*3/uL (ref 4.0–10.5)
nRBC: 0 % (ref 0.0–0.2)

## 2019-09-27 LAB — BASIC METABOLIC PANEL
Anion gap: 8 (ref 5–15)
BUN: 44 mg/dL — ABNORMAL HIGH (ref 6–20)
CO2: 26 mmol/L (ref 22–32)
Calcium: 8.3 mg/dL — ABNORMAL LOW (ref 8.9–10.3)
Chloride: 99 mmol/L (ref 98–111)
Creatinine, Ser: 1.73 mg/dL — ABNORMAL HIGH (ref 0.44–1.00)
GFR calc Af Amer: 45 mL/min — ABNORMAL LOW (ref >60)
GFR calc non Af Amer: 39 mL/min — ABNORMAL LOW (ref >60)
Glucose, Bld: 154 mg/dL — ABNORMAL HIGH (ref 70–99)
Potassium: 3.6 mmol/L (ref 3.5–5.1)
Sodium: 133 mmol/L — ABNORMAL LOW (ref 135–145)

## 2019-09-27 LAB — GLUCOSE, CAPILLARY
Glucose-Capillary: 172 mg/dL — ABNORMAL HIGH (ref 70–99)
Glucose-Capillary: 113 mg/dL — ABNORMAL HIGH (ref 70–99)
Glucose-Capillary: 59 mg/dL — ABNORMAL LOW (ref 70–99)
Glucose-Capillary: 279 mg/dL — ABNORMAL HIGH (ref 70–99)
Glucose-Capillary: 143 mg/dL — ABNORMAL HIGH (ref 70–99)

## 2019-09-27 IMAGING — DX DG THORACOLUMBAR SPINE 2V
2 series · 2 of 2 positions shown · non-contrast
Comparison: None.

CLINICAL DATA: Back pain

EXAM:
THORACOLUMBAR SPINE 1V

[tl-spine ap]
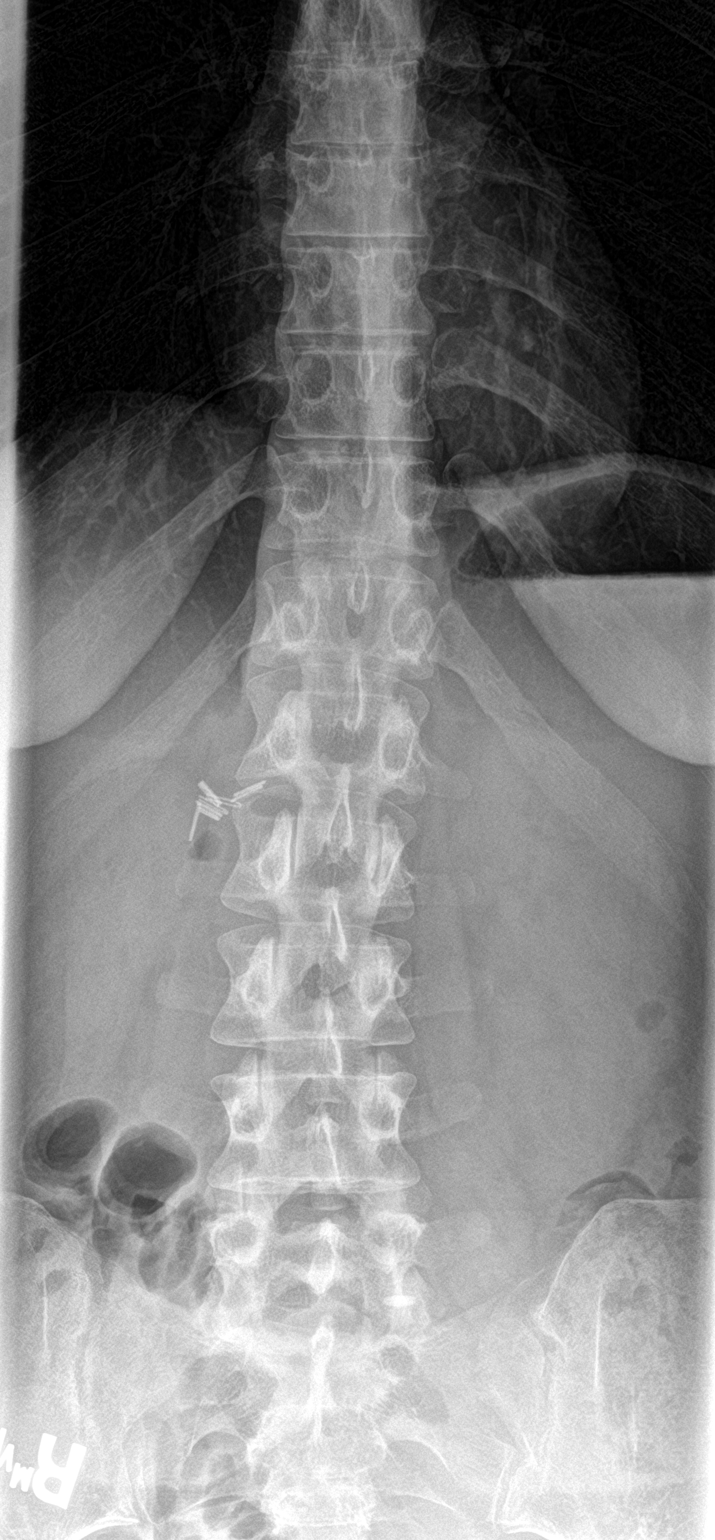

[tl-spine lat]
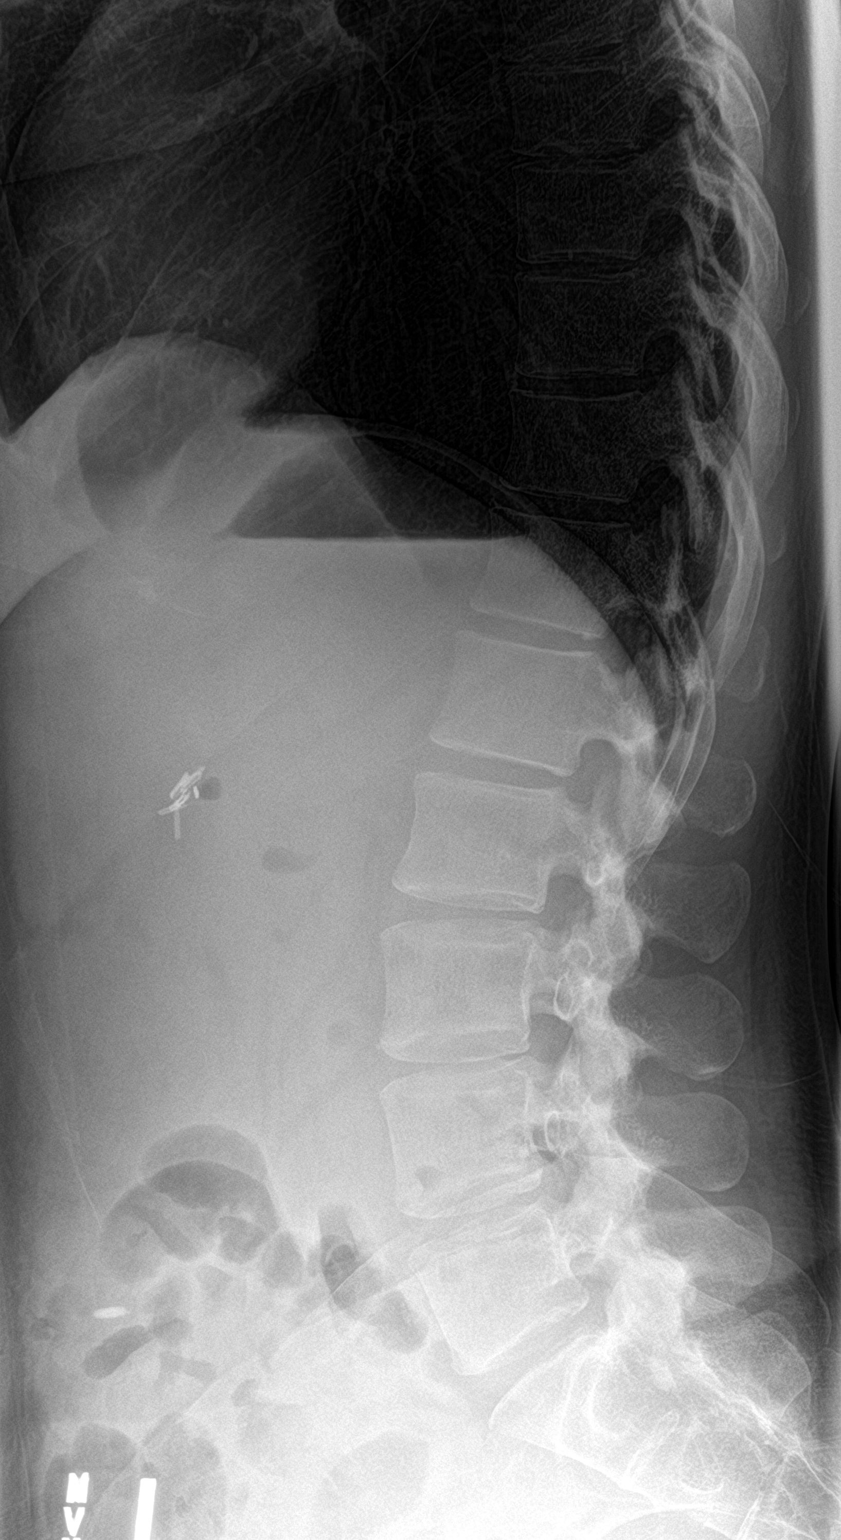

[2 of 2 positions shown; findings below may reference images not displayed]

FINDINGS: Degenerative facet disease in the lower lumbar spine. Disc spaces
maintained. Normal alignment. No fracture. Prior cholecystectomy.
IMPRESSION: Degenerative facet disease in the lower lumbar spine. No acute
findings.

## 2019-09-27 MED ORDER — LACTATED RINGERS IV SOLN: INTRAVENOUS | Status: AC

## 2019-09-27 MED ORDER — MELATONIN 3 MG PO TABS
3.0000 mg | ORAL_TABLET | Freq: Every day | ORAL | Status: DC
  Administered 2019-09-27 – 2019-09-29 (×3): 3 mg via ORAL
  Filled 2019-09-27 (×4): qty 1

## 2019-09-27 MED ORDER — HYDROMORPHONE HCL 1 MG/ML IJ SOLN
0.5000 mg | Freq: Four times a day (QID) | INTRAMUSCULAR | Status: DC | PRN
  Administered 2019-09-27 – 2019-09-30 (×11): 1 mg via INTRAVENOUS
  Filled 2019-09-27 (×11): qty 1

## 2019-09-27 NOTE — Progress Notes (Signed)
PROGRESS NOTE    [name]    Code Status: Full Code  KNL:[id number] DOB: 04-14-1990 DOA: 09/24/2019 LOS: 3 days  PCP: [name], MD CC:  Chief Complaint  Patient presents with  . Emesis       Hospital Summary   This is a 29 year old female with a history of type 1 diabetes,/P cholecystectomy, hypertension, multiple ED visits and hospitalizations for cyclic nausea and vomiting with severe gastroparesis and positive gastric emptying studies in the past however had normal gastric emptying study in 09/06/2019 which made her not a candidate for a gastric pacemaker (initial plan from [facility]) with concern for possible cannabinoid hyperemesis syndrome who presented with abdominal pain similar symptoms x2 days prior to admission.  Takes Bentyl, Maalox, Carafate, Phenergan, Protonix and Reglan at home. ED Course: In the ED she was given IV fluids IV Phenergan IV Reglan and her abdominal pain and vomiting continued.  She was noted to have a doubling of her serum creatinine and anion gap of 16.  Her CBG was 400 with a normal lipase.  Her BPs are noted to be markedly elevated which she associates with ongoing pain.  Also placed on insulin drip for hyperglycemia  Prior evaluation:  Abnormal GES in the past c/w gastroparesis  Colonoscopy 06/27/2017 - normal colon and ileum- normal random biopsies  Flex sig 03/10/2017 - poor prep, normal colon with normal biopsies  EGD 12/03/2011 - not completed due to retained food  EGD 10/05/2017 - retained gastric fluid which limited some views of body / fundus, otherwise normal, no outlet obstruction  CT scan 03/23/2018 - distended stomach, mild transmural thickening of small and large bowel, mild hepatic steatosis  CT angio scan 12/23/2017 - mild wall thickening of transverse colon, marked distension of bladder, body wall and mesenteric edema  CT renal stone protocol 05/31/19 - IMPRESSION:  1. Findings concerning for mild colitis. Clinical correlation  is  recommended. No bowel obstruction. Normal appendix.  2. No hydronephrosis or nephrolithiasis.  09/06/2019 gastric emptying study: Negative  6/28: Sumatriptan 25 mg x 1 trial, no improvement 6/29: Still with abdominal pain though slightly improved.  Diet downgraded to full liquids   A & P   Active Problems:   Chronic abdominal pain   Hypertension associated with diabetes ([place])   Diabetes mellitus type 1 (HCC)   Protein-calorie malnutrition, severe (HCC)   MDD (major depressive disorder), recurrent episode, mild (HCC)   Intractable nausea and vomiting   GERD (gastroesophageal reflux disease)   Normocytic anemia   Cyclical vomiting syndrome   Cannabinoid hyperemesis syndrome   COVID-19 virus infection   Cyclic vomiting syndrome   1. Acute on chronic abdominal pain  Nausea/Vomiting, Unknown etiology with extensive work-up in the past.  a. DDx: cannabinoid hyperemesis syndrome vs type 1 diabetes gastroparesis vs. Abdominal Migraine? b. See above work-up for abdominal pain and Dr. Doyne Keel note from 08/11/2019 c. Has a history of cranial migraines in the past and states these resolved after starting her current medical regimen d. Vomiting gets worse when she smokes cannabis, though symptoms get better in hot showers e. Trial of low-dose sumatriptan 25 mg x 1 did not improve symptoms, consider increasing dose tomorrow if still with pain f. Since she has had some improvement in symptoms GI was not consulted though she reported vomiting last night g. Downgrade diet to full liquids for 24 hours h. Decrease pain medication frequency  2. Hyperglycemia with type 1 diabetes a. HA1C 8.3 b. Was  briefly on insulin drip on admission c. Hypoglycemic episode today d. Lantus 8 units daily, discontinue NovoLog 2 units 3 times daily with meals and continue with sensitive sliding scale  3. COVID-19 positive test a. COVID 19 positive PCR with recent positive test in Feb 2021. Asymptomatic  from a COVID standpoint and with a cycle threshold of 41.8 indicating this is likely a recovered prior infection b. Off airborne precautions  4. Thoracolumbar Back pain a. Point tenderness around T6/T7 and L1 spinous proceses as well as paraspinal tenderness to palpation b. will get thoracolumbar x-ray  5. 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 When AKI resolves she could start on ACE inhibitor b. Workup as below  6. Hypertension a. Continue metoprolol b. Added amlodipine  7. AKI Improved today a. Continue IV fluids  8. Chronically elevated total protein with chronically low albumin, normocytic anemia and borderline elevated calcium corrected for albumin on recurrent labs as well as nephropathy a. Myeloma panel ordered  9. Normocytic anemia a. Prior unremarkable work-up b. Myeloma panel ordered   DVT prophylaxis: enoxaparin (LOVENOX) injection 40 mg Start: 09/25/19 1000   Family Communication: No family at bedside  Disposition Plan: she has to tolerate p.o. intake as well as improved AKI and abdominal pain, probably 24-48 hours Status is: Inpatient  Remains inpatient appropriate because:IV treatments appropriate due to intensity of illness or inability to take PO and Inpatient level of care appropriate due to severity of illness   Dispo: The patient is from: Home              Anticipated d/c is to: Home              Anticipated d/c date is: 2 days              Patient currently is not medically stable to d/c.          Pressure injury documentation    None  Consultants  None  Procedures  None  Antibiotics   Anti-infectives (From admission, onward)   None        Subjective   Still with abdominal pain though she reports her pain has improved, vomited last night but not today.  Has been tolerating some p.o. intake but not eating much.  Also still having persistent back pain.  Did not have much improvement with triptan yesterday.   Otherwise no issues  Objective   Vitals:   09/26/19 2002 09/26/19 2336 09/27/19 0542 09/27/19 1345  BP: (!) 164/110 (!) 138/91 130/85 (!) 151/100  Pulse: 92 85 93 97  Resp: 16 16 18 14   Temp: 97.6 F (36.4 C) 99.1 F (37.3 C) 98.5 F (36.9 C) (!) 97.2 F (36.2 C)  TempSrc: Oral Oral Oral Axillary  SpO2: 100% 100% 99% 100%  Weight:      Height:        Intake/Output Summary (Last 24 hours) at 09/27/2019 1645 Last data filed at 09/27/2019 1530 Gross per 24 hour  Intake 2196.37 ml  Output --  Net 2196.37 ml   Filed Weights   09/25/19 1732  Weight: 54.4 kg    Examination:  Physical Exam Vitals and nursing note reviewed.  Constitutional:      Appearance: Normal appearance.     Comments: Does not appear to be in much pain compared to reported symptoms  HENT:     Head: Normocephalic and atraumatic.  Eyes:     Conjunctiva/sclera: Conjunctivae normal.  Cardiovascular:     Rate and Rhythm:  Normal rate and regular rhythm.  Pulmonary:     Effort: Pulmonary effort is normal.     Breath sounds: Normal breath sounds.  Abdominal:     General: Abdomen is flat.     Palpations: Abdomen is soft.     Comments: Some abdominal tenderness  Musculoskeletal:        General: No swelling or tenderness.     Comments: Tenderness to palpation around T7 and L1  Skin:    Coloration: Skin is not jaundiced or pale.  Neurological:     Mental Status: She is alert. Mental status is at baseline.  Psychiatric:        Mood and Affect: Mood normal.        Behavior: Behavior normal.     Data Reviewed: I have personally reviewed following labs and imaging studies  CBC: Recent Labs  Lab 09/24/19 0141 09/24/19 1630 09/25/19 0438 09/26/19 0335 09/27/19 0424  WBC 4.6 5.9 5.9 8.3 6.2  NEUTROABS  --  4.3  --   --   --   HGB 9.9* 9.6* 8.6* 8.4* 8.2*  HCT 31.9* 31.0* 27.3* 27.2* 25.8*  MCV 83.5 82.2 81.7 84.2 81.6  PLT 224 231 214 222 465   Basic Metabolic Panel: Recent Labs  Lab  09/24/19 0141 09/24/19 1630 09/25/19 0428 09/26/19 0335 09/27/19 0424  NA 137 136 138 131* 133*  K 4.2 4.0 3.8 3.6 3.6  CL 102 94* 100 96* 99  CO2 20* 26 24 22 26   GLUCOSE 239* 400* 93 308* 154*  BUN 26* 37* 37* 48* 44*  CREATININE 1.21* 1.94* 1.49* 2.02* 1.73*  CALCIUM 9.3 9.0 8.5* 8.3* 8.3*  MG  --   --   --  1.6*  --    GFR: Estimated Creatinine Clearance: 40.8 mL/min (A) (by C-G formula based on SCr of 1.73 mg/dL (H)). Liver Function Tests: Recent Labs  Lab 09/24/19 0141 09/24/19 1630 09/25/19 0428  AST 33 25 28  ALT 37 35 31  ALKPHOS 97 101 87  BILITOT 0.7 0.6 0.4  PROT 8.5* 8.9* 8.0  ALBUMIN 3.2* 3.3* 2.9*   Recent Labs  Lab 09/24/19 0141 09/24/19 1630  LIPASE 23 21   No results for input(s): AMMONIA in the last 168 hours. Coagulation Profile: No results for input(s): INR, PROTIME in the last 168 hours. Cardiac Enzymes: No results for input(s): CKTOTAL, CKMB, CKMBINDEX, TROPONINI in the last 168 hours. BNP (last 3 results) No results for input(s): PROBNP in the last 8760 hours. HbA1C: Recent Labs    09/25/19 0438  HGBA1C 8.3*   CBG: Recent Labs  Lab 09/26/19 1656 09/26/19 2308 09/27/19 0722 09/27/19 1200 09/27/19 1305  GLUCAP 80 98 172* 113* 59*   Lipid Profile: No results for input(s): CHOL, HDL, LDLCALC, TRIG, CHOLHDL, LDLDIRECT in the last 72 hours. Thyroid Function Tests: No results for input(s): TSH, T4TOTAL, FREET4, T3FREE, THYROIDAB in the last 72 hours. Anemia Panel: No results for input(s): VITAMINB12, FOLATE, FERRITIN, TIBC, IRON, RETICCTPCT in the last 72 hours. Sepsis Labs: No results for input(s): PROCALCITON, LATICACIDVEN in the last 168 hours.  Recent Results (from the past 240 hour(s))  SARS Coronavirus 2 by RT PCR (hospital order, performed in [facility] hospital lab) Nasopharyngeal Nasopharyngeal Swab     Status: Abnormal   Collection Time: 09/24/19  6:18 PM   Specimen: Nasopharyngeal Swab  Result Value Ref Range Status    SARS Coronavirus 2 POSITIVE (A) NEGATIVE Final    Comment: RESULT CALLED TO,  READ BACK BY AND VERIFIED WITH: J.Everlean Patterson [id number] @2030  BY V.WILKINS (NOTE) SARS-CoV-2 target nucleic acids are DETECTED  SARS-CoV-2 RNA is generally detectable in upper respiratory specimens  during the acute phase of infection.  Positive results are indicative  of the presence of the identified virus, but do not rule out bacterial infection or co-infection with other pathogens not detected by the test.  Clinical correlation with patient history and  other diagnostic information is necessary to determine patient infection status.  The expected result is negative.  Fact Sheet for Patients:   StrictlyIdeas.no   Fact Sheet for Healthcare Providers:   BankingDealers.co.za    This test is not yet approved or cleared by the [place] FDA and  has been authorized for detection and/or diagnosis of SARS-CoV-2 by FDA under an Emergency Use Authorization (EUA).  This EUA will remain in effect (meaning  this test can be used) for the duration of  the COVID-19 declaration under Section 564(b)(1) of the Act, 21 U.S.C. section 360-bbb-3(b)(1), unless the authorization is terminated or revoked sooner.  Performed at [facility], [place] [address], [place], [place] [address]          Radiology Studies: No results found.      Scheduled Meds: . amLODipine  5 mg Oral Daily  . capsaicin   Topical BID  . enoxaparin (LOVENOX) injection  40 mg Subcutaneous Q24H  . furosemide  40 mg Oral Daily  . hydrALAZINE  50 mg Oral Q6H  . insulin aspart  0-6 Units Subcutaneous TID WC  . insulin aspart  2 Units Subcutaneous TID WC  . insulin glargine  8 Units Subcutaneous Daily  . melatonin  3 mg Oral QHS  . metoCLOPramide (REGLAN) injection  10 mg Intravenous Q8H  . metoprolol tartrate  25 mg Oral BID  . mirtazapine  30 mg Oral QHS  .  norethindrone  1 tablet Oral Daily  . ondansetron (ZOFRAN) IV  4 mg Intravenous Q6H  . pantoprazole (PROTONIX) IV  40 mg Intravenous Q12H  . potassium chloride  10 mEq Oral Daily  . promethazine  12.5 mg Intravenous Q6H  . sodium chloride flush  10-40 mL Intracatheter Q12H  . sucralfate  1 g Oral TID WC & HS   Continuous Infusions: . sodium chloride 10 mL/hr at 09/27/19 0200     Time spent: 26 minutes with over 50% of the time coordinating the patient's care    Harold Hedge, DO [facility] Pager [phone]  Call night coverage person covering after 7pm

## 2019-09-28 LAB — CBC
HCT: 27.6 % — ABNORMAL LOW (ref 36.0–46.0)
Hemoglobin: 8.5 g/dL — ABNORMAL LOW (ref 12.0–15.0)
MCH: 25.5 pg — ABNORMAL LOW (ref 26.0–34.0)
MCHC: 30.8 g/dL (ref 30.0–36.0)
MCV: 82.9 fL (ref 80.0–100.0)
Platelets: 201 10*3/uL (ref 150–400)
RBC: 3.33 MIL/uL — ABNORMAL LOW (ref 3.87–5.11)
RDW: 14.2 % (ref 11.5–15.5)
WBC: 4.6 10*3/uL (ref 4.0–10.5)
nRBC: 0 % (ref 0.0–0.2)

## 2019-09-28 LAB — GLUCOSE, CAPILLARY
Glucose-Capillary: 204 mg/dL — ABNORMAL HIGH (ref 70–99)
Glucose-Capillary: 71 mg/dL (ref 70–99)
Glucose-Capillary: 150 mg/dL — ABNORMAL HIGH (ref 70–99)
Glucose-Capillary: 141 mg/dL — ABNORMAL HIGH (ref 70–99)

## 2019-09-28 LAB — BASIC METABOLIC PANEL
Anion gap: 7 (ref 5–15)
BUN: 35 mg/dL — ABNORMAL HIGH (ref 6–20)
CO2: 27 mmol/L (ref 22–32)
Calcium: 8.8 mg/dL — ABNORMAL LOW (ref 8.9–10.3)
Chloride: 101 mmol/L (ref 98–111)
Creatinine, Ser: 1.5 mg/dL — ABNORMAL HIGH (ref 0.44–1.00)
GFR calc Af Amer: 54 mL/min — ABNORMAL LOW (ref >60)
GFR calc non Af Amer: 46 mL/min — ABNORMAL LOW (ref >60)
Glucose, Bld: 168 mg/dL — ABNORMAL HIGH (ref 70–99)
Potassium: 3.8 mmol/L (ref 3.5–5.1)
Sodium: 135 mmol/L (ref 135–145)

## 2019-09-28 NOTE — Progress Notes (Signed)
PROGRESS NOTE    [name]  ZOX:[id number] DOB: [birth year] DOA: 09/24/2019 PCP: [name], MD    Brief Narrative:  Patient was admitted to the hospital for planned diagnosis of intractable nausea vomiting.  29 year old female with past medical history for type 1 diabetes mellitus, and hypertension.  Presents with intractable nausea and vomiting.  She has diagnosis of gastroparesis, and cannabinoid use.  On her initial physical examination blood pressure 156/97, heart rate 120, respiratory 21, oxygen saturation 97%, lungs clear to auscultation bilaterally, heart S1-S2 present with me, abdomen soft nontender, no lower extremity edema. Sodium 137, potassium 4.2, chloride 92, bicarb 20, glucose 239, BUN 26, creatinine 1.21, anion gap 15.   Assessment & Plan:   Active Problems:   Chronic abdominal pain   Hypertension associated with diabetes ([place])   Diabetes mellitus type 1 (HCC)   Protein-calorie malnutrition, severe (HCC)   MDD (major depressive disorder), recurrent episode, mild (HCC)   Intractable nausea and vomiting   GERD (gastroesophageal reflux disease)   Normocytic anemia   Cyclical vomiting syndrome   Cannabinoid hyperemesis syndrome   COVID-19 virus infection   Cyclic vomiting syndrome   1. Intractable nausea and vomiting. Improved symptoms today, but not yet back to baseline.   Continue as needed antiemetics, and analgesics. On antiacid therapy. Diet advanced to soft with good toleration, plan for possible dc in am.  2. HTN. Continue blood pressure control with hydralazine, amlodipine,,and metoprolol.   3. Type 1 DM, uncontrolled. Patient had brief episode of DKA improved with IV insulin. Fasting glucose this am is 168.  Continue insulin therapy with 8 units lantus, pre-meal insulin 2 units and insulin sliding scale.   4. Chronic back pain and depression. Continue pain control with hydromorphone, continue with mirtazapine,   5. AKI with hypokalemia Renal  function with serum cr at 1.50 with K at 3,8 and serum bicarbonate at 27. Will hold on furosemide for now, along with K supplements.   Will advance diet and will follow renal panel in am  6. Normocytic anemia. Stable Hgb and Hct    Status is: Inpatient  Remains inpatient appropriate because:Inpatient level of care appropriate due to severity of illness   Dispo: The patient is from: Home              Anticipated d/c is to: Home              Anticipated d/c date is: 1 day              Patient currently is not medically stable to d/c.   DVT prophylaxis: Enoxaparin   Code Status:   full  Family Communication:  No family at the bedside     Subjective: Patient is feeling better, no dyspnea or chest pain, nausea and vomiting have improved.   Objective: Vitals:   09/27/19 1345 09/27/19 2153 09/28/19 0536 09/28/19 1407  BP: (!) 151/100 113/78 (!) 136/93 97/63  Pulse: 97 94 91 84  Resp: 14 17 19 17   Temp: (!) 97.2 F (36.2 C) 98.4 F (36.9 C) 98.4 F (36.9 C) 97.9 F (36.6 C)  TempSrc: Axillary   Oral  SpO2: 100% 100% 98% 100%  Weight:      Height:        Intake/Output Summary (Last 24 hours) at 09/28/2019 1706 Last data filed at 09/28/2019 1500 Gross per 24 hour  Intake 2711.99 ml  Output --  Net 2711.99 ml   [employer]   09/25/19  1732  Weight: 54.4 kg    Examination:   General: Not in pain or dyspnea, deconditioned  Neurology: Awake and alert, non focal  E ENT: no pallor, no icterus, oral mucosa moist Cardiovascular: No JVD. S1-S2 present, rhythmic, no gallops, rubs, or murmurs. No lower extremity edema. Pulmonary: positive breath sounds bilaterally, adequate air movement, no wheezing, rhonchi or rales. Gastrointestinal. Abdomen soft with no organomegaly, non tender, no rebound or guarding Skin. No rashes Musculoskeletal: no joint deformities     Data Reviewed: I have personally reviewed following labs and imaging studies  CBC: Recent Labs  Lab  09/24/19 1630 09/25/19 0438 09/26/19 0335 09/27/19 0424 09/28/19 0433  WBC 5.9 5.9 8.3 6.2 4.6  NEUTROABS 4.3  --   --   --   --   HGB 9.6* 8.6* 8.4* 8.2* 8.5*  HCT 31.0* 27.3* 27.2* 25.8* 27.6*  MCV 82.2 81.7 84.2 81.6 82.9  PLT 231 214 222 189 742   Basic Metabolic Panel: Recent Labs  Lab 09/24/19 1630 09/25/19 0428 09/26/19 0335 09/27/19 0424 09/28/19 0433  NA 136 138 131* 133* 135  K 4.0 3.8 3.6 3.6 3.8  CL 94* 100 96* 99 101  CO2 26 24 22 26 27   GLUCOSE 400* 93 308* 154* 168*  BUN 37* 37* 48* 44* 35*  CREATININE 1.94* 1.49* 2.02* 1.73* 1.50*  CALCIUM 9.0 8.5* 8.3* 8.3* 8.8*  MG  --   --  1.6*  --   --    GFR: Estimated Creatinine Clearance: 47.1 mL/min (A) (by C-G formula based on SCr of 1.5 mg/dL (H)). Liver Function Tests: Recent Labs  Lab 09/24/19 0141 09/24/19 1630 09/25/19 0428  AST 33 25 28  ALT 37 35 31  ALKPHOS 97 101 87  BILITOT 0.7 0.6 0.4  PROT 8.5* 8.9* 8.0  ALBUMIN 3.2* 3.3* 2.9*   Recent Labs  Lab 09/24/19 0141 09/24/19 1630  LIPASE 23 21   No results for input(s): AMMONIA in the last 168 hours. Coagulation Profile: No results for input(s): INR, PROTIME in the last 168 hours. Cardiac Enzymes: No results for input(s): CKTOTAL, CKMB, CKMBINDEX, TROPONINI in the last 168 hours. BNP (last 3 results) No results for input(s): PROBNP in the last 8760 hours. HbA1C: No results for input(s): HGBA1C in the last 72 hours. CBG: Recent Labs  Lab 09/27/19 1654 09/27/19 2155 09/28/19 0738 09/28/19 1143 09/28/19 1651  GLUCAP 279* 143* 204* 71 150*   Lipid Profile: No results for input(s): CHOL, HDL, LDLCALC, TRIG, CHOLHDL, LDLDIRECT in the last 72 hours. Thyroid Function Tests: No results for input(s): TSH, T4TOTAL, FREET4, T3FREE, THYROIDAB in the last 72 hours. Anemia Panel: No results for input(s): VITAMINB12, FOLATE, FERRITIN, TIBC, IRON, RETICCTPCT in the last 72 hours.    Radiology Studies: I have reviewed all of the imaging  during this hospital visit personally     Scheduled Meds: . amLODipine  5 mg Oral Daily  . capsaicin   Topical BID  . enoxaparin (LOVENOX) injection  40 mg Subcutaneous Q24H  . furosemide  40 mg Oral Daily  . hydrALAZINE  50 mg Oral Q6H  . insulin aspart  0-6 Units Subcutaneous TID WC  . insulin glargine  8 Units Subcutaneous Daily  . melatonin  3 mg Oral QHS  . metoCLOPramide (REGLAN) injection  10 mg Intravenous Q8H  . metoprolol tartrate  25 mg Oral BID  . mirtazapine  30 mg Oral QHS  . norethindrone  1 tablet Oral Daily  . ondansetron (ZOFRAN)  IV  4 mg Intravenous Q6H  . pantoprazole (PROTONIX) IV  40 mg Intravenous Q12H  . potassium chloride  10 mEq Oral Daily  . promethazine  12.5 mg Intravenous Q6H  . sodium chloride flush  10-40 mL Intracatheter Q12H  . sucralfate  1 g Oral TID WC & HS   Continuous Infusions: . sodium chloride 10 mL/hr at 09/28/19 1500     LOS: 4 days        [name] Gerome Apley, MD

## 2019-09-29 DIAGNOSIS — E43 Unspecified severe protein-calorie malnutrition: Secondary | ICD-10-CM

## 2019-09-29 DIAGNOSIS — D649 Anemia, unspecified: Secondary | ICD-10-CM

## 2019-09-29 DIAGNOSIS — N179 Acute kidney failure, unspecified: Secondary | ICD-10-CM

## 2019-09-29 LAB — BASIC METABOLIC PANEL
Anion gap: 6 (ref 5–15)
BUN: 34 mg/dL — ABNORMAL HIGH (ref 6–20)
CO2: 27 mmol/L (ref 22–32)
Calcium: 9 mg/dL (ref 8.9–10.3)
Chloride: 101 mmol/L (ref 98–111)
Creatinine, Ser: 1.78 mg/dL — ABNORMAL HIGH (ref 0.44–1.00)
GFR calc Af Amer: 44 mL/min — ABNORMAL LOW (ref >60)
GFR calc non Af Amer: 38 mL/min — ABNORMAL LOW (ref >60)
Glucose, Bld: 160 mg/dL — ABNORMAL HIGH (ref 70–99)
Potassium: 3.6 mmol/L (ref 3.5–5.1)
Sodium: 134 mmol/L — ABNORMAL LOW (ref 135–145)

## 2019-09-29 LAB — GLUCOSE, CAPILLARY
Glucose-Capillary: 196 mg/dL — ABNORMAL HIGH (ref 70–99)
Glucose-Capillary: 60 mg/dL — ABNORMAL LOW (ref 70–99)
Glucose-Capillary: 53 mg/dL — ABNORMAL LOW (ref 70–99)
Glucose-Capillary: 134 mg/dL — ABNORMAL HIGH (ref 70–99)
Glucose-Capillary: 171 mg/dL — ABNORMAL HIGH (ref 70–99)

## 2019-09-29 MED ORDER — OXYCODONE-ACETAMINOPHEN 5-325 MG PO TABS: 1.0000 | ORAL_TABLET | ORAL | Status: DC | PRN

## 2019-09-29 MED ORDER — PROMETHAZINE HCL 25 MG/ML IJ SOLN
12.5000 mg | INTRAMUSCULAR | Status: DC | PRN
  Filled 2019-09-29: qty 1

## 2019-09-29 MED ORDER — ONDANSETRON HCL 4 MG/2ML IJ SOLN
4.0000 mg | Freq: Three times a day (TID) | INTRAMUSCULAR | Status: DC
  Administered 2019-09-29 – 2019-09-30 (×2): 4 mg via INTRAVENOUS
  Filled 2019-09-29 (×2): qty 2

## 2019-09-29 NOTE — Discharge Summary (Signed)
Physician Discharge Summary  [name] ZOX:[id number] DOB: Oct 16, 1990 DOA: 09/24/2019  PCP: [name], MD  Admit date: 09/24/2019 Discharge date: 09/29/2019  Admitted From: Home  Disposition:  Home   Recommendations for Outpatient Follow-up and new medication changes:  1. Follow up  Dr. Jerline Pain in 7 days. 2. Continue antiacid therapy and as needed antiemetics.  3. Will hold on furosemide and potassium supplementation for now to prevent renal dysfunction and electrolyte abnormalities.   Home Health: no   Equipment/Devices: no    Discharge Condition: stable  CODE STATUS: full  Diet recommendation: heart healthy and diabetic prudent.   Brief/Interim Summary: Patient was admitted to the hospital for planned diagnosis of intractable nausea vomiting complicated with DKA..  29 year old female with past medical history for type 1 diabetes mellitus, and hypertension.  Presents with intractable nausea and vomiting.  She has diagnosis of gastroparesis, and cannabinoid use.  On her initial physical examination blood pressure 156/97, heart rate 120, respiratory 21, oxygen saturation 97%, lungs clear to auscultation bilaterally, heart S1-S2 present and rhythmic, her abdomen was soft nontender, no lower extremity edema. Sodium 137, potassium 4.2, chloride 92, bicarb 20, glucose 239, BUN 26, creatinine 1.21, anion gap 15. 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 Urine analysis negative for infection.  Tox screen positive for tetrahydrocannabinol.  1.  Intractable nausea and vomiting due to gastritis/ tetrahydrocannabinol use.  The patient was admitted to the medical ward she received supportive medical therapy with intravenous fluids, antiacids and as needed antiemetics - analgesics.  Responded well to medical therapy, her diet was advanced with good toleration.  She was advised about avoiding tetrahydrocannabinol. Continue with pantoprazole and sucralfate.   2.  Type 1 diabetes mellitus, uncontrolled  Hgb A1c 8,3.  DKA.  She received IV insulin with correction of anion gap and acidosis, she was transitioned to subcutaneous insulin with good toleration.  At discharge she will resume her home regimen of insulin with close follow-up as an outpatient.  3.  Hypertension.  Continue blood pressure control will metoprolol and hydralazine.   4.  Chronic back pain, depression.  Patient received analgesics with good toleration, at discharge continue mirtazapine.  5.  Acute kidney injury with hyperkalemia/hypomagnesemia.  Patient received intravenous fluids, her diet has been advanced with good toleration, her electrolytes were corrected.  Her discharge creatinine is 1.78 from a peak creatinine of 2.0.  Her potassium is 3.6 and serum bicarbonate of 27.  We will hold on further diuretic therapy to prevent electrolyte disturbances and kidney injury.  6.  Normocytic anemia, her hemoglobin and hematocrit remained stable.  Follow-up as an outpatient. Serum protein electrophoresis negative.   7. Sever protein calori malnutrition. Continue with nutritional supplements, will need close follow up as outpatient.    Discharge Diagnoses:  Principal Problem:   Intractable nausea and vomiting Active Problems:   Chronic abdominal pain   Hypertension associated with diabetes ([place])   Diabetes mellitus type 1 (HCC)   Protein-calorie malnutrition, severe (HCC)   MDD (major depressive disorder), recurrent episode, mild (HCC)   GERD (gastroesophageal reflux disease)   Normocytic anemia   Cyclical vomiting syndrome   Cannabinoid hyperemesis syndrome   Cyclic vomiting syndrome   AKI (acute kidney injury) ([place])    Discharge Instructions   Allergies as of 09/29/2019      Reactions   Other Anaphylaxis   Reaction to [place] nuts   Lactose Intolerance (gi) Diarrhea      Medication List    STOP taking these  medications   furosemide 40 MG tablet Commonly known as: LASIX   potassium chloride 10 MEQ  tablet Commonly known as: KLOR-CON     TAKE these medications   aluminum-magnesium hydroxide-simethicone 798-921-19 MG/5ML Susp Commonly known as: MAALOX Take 30 mLs by mouth 3 (three) times daily as needed (indigestion).   cyclobenzaprine 10 MG tablet Commonly known as: FLEXERIL Take 1 tablet (10 mg total) by mouth 3 (three) times daily as needed for muscle spasms.   Dexcom G6 Sensor Misc 1 each by Does not apply route See admin instructions. every 10 days.   dicyclomine 20 MG tablet Commonly known as: BENTYL Take 1 tablet (20 mg total) by mouth 3 (three) times daily as needed for spasms.   hydrALAZINE 50 MG tablet Commonly known as: APRESOLINE Take 1 tablet (50 mg total) by mouth every 6 (six) hours. What changed: when to take this   insulin lispro 100 UNIT/ML KwikPen Commonly known as: HumaLOG KwikPen Inject 0.06 mLs (6 Units total) into the skin 3 (three) times daily. What changed:   how much to take  additional instructions   Lantus SoloStar 100 UNIT/ML Solostar Pen Generic drug: insulin glargine Inject 10 Units into the skin at bedtime. And pen needles 4/day   loperamide 2 MG capsule Commonly known as: IMODIUM Take 6 mg by mouth daily as needed for diarrhea or loose stools.   LORazepam 0.5 MG tablet Commonly known as: ATIVAN Take 1 tablet (0.5 mg total) by mouth 2 (two) times daily as needed for anxiety.   metoCLOPramide 5 MG tablet Commonly known as: REGLAN Take 1 tablet (5 mg total) by mouth in the morning and at bedtime.   metoprolol tartrate 25 MG tablet Commonly known as: LOPRESSOR Take 1 tablet (25 mg total) by mouth 2 (two) times daily.   mirtazapine 30 MG tablet Commonly known as: REMERON TAKE 1 TABLET BY MOUTH AT  BEDTIME   norethindrone 0.35 MG tablet Commonly known as: Ortho Micronor Take 1 tablet (0.35 mg total) by mouth daily.   OVER THE COUNTER MEDICATION Take 2 each by mouth daily. Flo Chewable Gummies for period   pantoprazole 40  MG tablet Commonly known as: PROTONIX Take 1 tablet (40 mg total) by mouth daily.   promethazine 25 MG suppository Commonly known as: PHENERGAN Place 1 suppository (25 mg total) rectally in the morning and at bedtime. DO NOT take oral phenergan and suppositories at the same time.   promethazine 12.5 MG tablet Commonly known as: PHENERGAN Take 1 tablet (12.5 mg total) by mouth every 8 (eight) hours as needed for nausea or vomiting.   sucralfate 1 GM/10ML suspension Commonly known as: Carafate Take 10 mLs (1 g total) by mouth 4 (four) times daily -  with meals and at bedtime.       Allergies  Allergen Reactions  . Other Anaphylaxis    Reaction to [place] nuts   . Lactose Intolerance (Gi) Diarrhea        Procedures/Studies: DG THORACOLUMABAR SPINE  Result Date: 09/27/2019 CLINICAL DATA:  Back pain EXAM: THORACOLUMBAR SPINE 1V COMPARISON:  None. FINDINGS: Degenerative facet disease in the lower lumbar spine. Disc spaces maintained. Normal alignment. No fracture. Prior cholecystectomy. IMPRESSION: Degenerative facet disease in the lower lumbar spine. No acute findings. Electronically Signed   By: Rolm Baptise M.D.   On: 09/27/2019 19:22   NM Gastric Emptying  Result Date: 09/06/2019 CLINICAL DATA:  Chronic diarrhea, nausea and vomiting for 2 years, gastric paresis EXAM: NUCLEAR MEDICINE GASTRIC  EMPTYING SCAN TECHNIQUE: After oral ingestion of radiolabeled meal, sequential abdominal images were obtained for 4 hours. Percentage of activity emptying the stomach was calculated at 1 hour, 2 hour, 3 hour, and 4 hours. RADIOPHARMACEUTICALS:  1.8 mCi Tc-65msulfur colloid in standardized meal COMPARISON:  12/05/2011 FINDINGS: Expected location of the stomach in the left upper quadrant. Ingested meal empties the stomach gradually over the course of the study. 63% emptied at 1 hr ( normal >= 10%) 90% emptied at 2 hr ( normal >= 40%) 95% emptied at 3 hr ( normal >= 70%) N/A% emptied at 4 hr (  normal >= 90%) (imaging at 4 hours was not required) IMPRESSION: Normal exam. Electronically Signed   By: MLavonia DanaM.D.   On: 09/06/2019 17:02        Subjective: Patient is feeling better this am, tolerating po well.   Discharge Exam: Vitals:   09/28/19 2238 09/29/19 0546  BP: 121/89 (!) 148/88  Pulse: 100 97  Resp: 18 18  Temp: 98.2 F (36.8 C) 98.7 F (37.1 C)  SpO2: 100% 100%   Vitals:   09/28/19 1407 09/28/19 1709 09/28/19 2238 09/29/19 0546  BP: 97/63 128/82 121/89 (!) 148/88  Pulse: 84 92 100 97  Resp: 17  18 18   Temp: 97.9 F (36.6 C)  98.2 F (36.8 C) 98.7 F (37.1 C)  TempSrc: Oral  Oral Oral  SpO2: 100%  100% 100%  Weight:      Height:        General: Not in pain or dyspnea.  Neurology: Awake and alert, non focal  E ENT: no pallor, no icterus, oral mucosa moist Cardiovascular: No JVD. S1-S2 present, rhythmic, no gallops, rubs, or murmurs. No lower extremity edema. Pulmonary: positive breath sounds bilaterally, adequate air movement, no wheezing, rhonchi or rales. Gastrointestinal. Abdomen soft with no organomegaly, non tender, no rebound or guarding Skin. No rashes Musculoskeletal: no joint deformities   The results of significant diagnostics from this hospitalization (including imaging, microbiology, ancillary and laboratory) are listed below for reference.     Microbiology: Recent Results (from the past 240 hour(s))  SARS Coronavirus 2 by RT PCR ([facility] Nasopharyngeal Nasopharyngeal Swab     Status: Abnormal   Collection Time: 09/24/19  6:18 PM   Specimen: Nasopharyngeal Swab  Result Value Ref Range Status   SARS Coronavirus 2 POSITIVE (A) NEGATIVE Final    Comment: RESULT CALLED TO, READ BACK BY AND VERIFIED WITH: J.TEverlean [id number]@2030  BY V.WILKINS (NOTE) SARS-CoV-2 target nucleic acids are DETECTED  SARS-CoV-2 RNA is generally detectable in upper respiratory specimens  during the  acute phase of infection.  Positive results are indicative  of the presence of the identified virus, but do not rule out bacterial infection or co-infection with other pathogens not detected by the test.  Clinical correlation with patient history and  other diagnostic information is necessary to determine patient infection status.  The expected result is negative.  Fact Sheet for Patients:   hStrictlyIdeas.no  Fact Sheet for Healthcare Providers:   hBankingDealers.co.za   This test is not yet approved or cleared by the [place] and  has been authorized for detection and/or diagnosis of SARS-CoV-2 by FDA under an Emergency Use Authorization (EUA).  This EUA will remain in effect (meaning  this test can be used) for the duration of  the COVID-19 declaration under Section 564(b)(1) of the Act, 21 U.S.C. section 360-bbb-3(b)(1), unless the  authorization is terminated or revoked sooner.  Performed at [facility], [place] [address] [place], [place] [address]      Labs: BNP (last 3 results) No results for input(s): BNP in the last 8760 hours. Basic Metabolic Panel: Recent Labs  Lab 09/25/19 0428 09/26/19 0335 09/27/19 0424 09/28/19 0433 09/29/19 0155  NA 138 131* 133* 135 134*  K 3.8 3.6 3.6 3.8 3.6  CL 100 96* 99 101 101  CO2 24 22 26 27 27   GLUCOSE 93 308* 154* 168* 160*  BUN 37* 48* 44* 35* 34*  CREATININE 1.49* 2.02* 1.73* 1.50* 1.78*  CALCIUM 8.5* 8.3* 8.3* 8.8* 9.0  MG  --  1.6*  --   --   --    Liver Function Tests: Recent Labs  Lab 09/24/19 0141 09/24/19 1630 09/25/19 0428  AST 33 25 28  ALT 37 35 31  ALKPHOS 97 101 87  BILITOT 0.7 0.6 0.4  PROT 8.5* 8.9* 8.0  ALBUMIN 3.2* 3.3* 2.9*   Recent Labs  Lab 09/24/19 0141 09/24/19 1630  LIPASE 23 21   No results for input(s): AMMONIA in the last 168 hours. CBC: Recent Labs  Lab 09/24/19 1630 09/25/19 0438 09/26/19 0335  09/27/19 0424 09/28/19 0433  WBC 5.9 5.9 8.3 6.2 4.6  NEUTROABS 4.3  --   --   --   --   HGB 9.6* 8.6* 8.4* 8.2* 8.5*  HCT 31.0* 27.3* 27.2* 25.8* 27.6*  MCV 82.2 81.7 84.2 81.6 82.9  PLT 231 214 222 189 201   Cardiac Enzymes: No results for input(s): CKTOTAL, CKMB, CKMBINDEX, TROPONINI in the last 168 hours. BNP: Invalid input(s): POCBNP CBG: Recent Labs  Lab 09/28/19 0738 09/28/19 1143 09/28/19 1651 09/28/19 2226 09/29/19 0753  GLUCAP 204* 71 150* 141* 196*   D-Dimer No results for input(s): DDIMER in the last 72 hours. Hgb A1c No results for input(s): HGBA1C in the last 72 hours. Lipid Profile No results for input(s): CHOL, HDL, LDLCALC, TRIG, CHOLHDL, LDLDIRECT in the last 72 hours. Thyroid function studies No results for input(s): TSH, T4TOTAL, T3FREE, THYROIDAB in the last 72 hours.  Invalid input(s): FREET3 Anemia work up No results for input(s): VITAMINB12, FOLATE, FERRITIN, TIBC, IRON, RETICCTPCT in the last 72 hours. Urinalysis    Component Value Date/Time   COLORURINE YELLOW 09/24/2019 1630   APPEARANCEUR HAZY (A) 09/24/2019 1630   LABSPEC 1.018 09/24/2019 1630   PHURINE 5.0 09/24/2019 1630   GLUCOSEU >=500 (A) 09/24/2019 1630   HGBUR MODERATE (A) 09/24/2019 1630   BILIRUBINUR NEGATIVE 09/24/2019 1630   BILIRUBINUR negative 01/02/2015 1717   KETONESUR 5 (A) 09/24/2019 1630   PROTEINUR >=300 (A) 09/24/2019 1630   UROBILINOGEN 0.2 01/13/2015 0223   NITRITE NEGATIVE 09/24/2019 1630   LEUKOCYTESUR NEGATIVE 09/24/2019 1630   Sepsis Labs Invalid input(s): PROCALCITONIN,  WBC,  LACTICIDVEN Microbiology Recent Results (from the past 240 hour(s))  SARS Coronavirus 2 by RT PCR (hospital order, performed in [place] hospital lab) Nasopharyngeal Nasopharyngeal Swab     Status: Abnormal   Collection Time: 09/24/19  6:18 PM   Specimen: Nasopharyngeal Swab  Result Value Ref Range Status   SARS Coronavirus 2 POSITIVE (A) NEGATIVE Final    Comment: RESULT  CALLED TO, READ BACK BY AND VERIFIED WITH: J.Everlean Patterson [id number] @2030  BY V.WILKINS (NOTE) SARS-CoV-2 target nucleic acids are DETECTED  SARS-CoV-2 RNA is generally detectable in upper respiratory specimens  during the acute phase of infection.  Positive results are indicative  of the  presence of the identified virus, but do not rule out bacterial infection or co-infection with other pathogens not detected by the test.  Clinical correlation with patient history and  other diagnostic information is necessary to determine patient infection status.  The expected result is negative.  Fact Sheet for Patients:   StrictlyIdeas.no   Fact Sheet for Healthcare Providers:   BankingDealers.co.za    This test is not yet approved or cleared by the [place] FDA and  has been authorized for detection and/or diagnosis of SARS-CoV-2 by FDA under an Emergency Use Authorization (EUA).  This EUA will remain in effect (meaning  this test can be used) for the duration of  the COVID-19 declaration under Section 564(b)(1) of the Act, 21 U.S.C. section 360-bbb-3(b)(1), unless the authorization is terminated or revoked sooner.  Performed at [facility], [place] [address] [place], [place] [address]      Time coordinating discharge: 45 minutes  SIGNED:   Tawni Millers, MD  [facility] 09/29/2019, 9:24 AM

## 2019-09-29 NOTE — Plan of Care (Signed)
  Problem: Health Behavior/Discharge Planning: Goal: Ability to manage health-related needs will improve Outcome: Progressing   Problem: Clinical Measurements: Goal: Ability to maintain clinical measurements within normal limits will improve Outcome: Progressing   Problem: Nutrition: Goal: Adequate nutrition will be maintained Outcome: Progressing   Problem: Elimination: Goal: Will not experience complications related to bowel motility Outcome: Progressing   Problem: Pain Managment: Goal: General experience of comfort will improve Outcome: Progressing

## 2019-09-30 LAB — MULTIPLE MYELOMA PANEL, SERUM
Albumin SerPl Elph-Mcnc: 2.9 g/dL (ref 2.9–4.4)
Albumin/Glob SerPl: 0.8 (ref 0.7–1.7)
Alpha 1: 0.2 g/dL (ref 0.0–0.4)
Alpha2 Glob SerPl Elph-Mcnc: 0.9 g/dL (ref 0.4–1.0)
B-Globulin SerPl Elph-Mcnc: 1.2 g/dL (ref 0.7–1.3)
Gamma Glob SerPl Elph-Mcnc: 1.6 g/dL (ref 0.4–1.8)
Globulin, Total: 4 g/dL — ABNORMAL HIGH (ref 2.2–3.9)
IgA: 335 mg/dL (ref 87–352)
IgG (Immunoglobin G), Serum: 1732 mg/dL — ABNORMAL HIGH (ref 586–1602)
IgM (Immunoglobulin M), Srm: 210 mg/dL (ref 26–217)
Total Protein ELP: 6.9 g/dL (ref 6.0–8.5)

## 2019-09-30 LAB — GLUCOSE, CAPILLARY: Glucose-Capillary: 299 mg/dL — ABNORMAL HIGH (ref 70–99)

## 2019-09-30 NOTE — Plan of Care (Signed)
  Problem: Health Behavior/Discharge Planning: Goal: Ability to manage health-related needs will improve 09/30/2019 1320 by Hubert Azure, RN Outcome: Adequate for Discharge 09/30/2019 1320 by Hubert Azure, RN Outcome: Adequate for Discharge   Problem: Clinical Measurements: Goal: Ability to maintain clinical measurements within normal limits will improve 09/30/2019 1320 by Hubert Azure, RN Outcome: Adequate for Discharge 09/30/2019 1320 by Hubert Azure, RN Outcome: Adequate for Discharge   Problem: Nutrition: Goal: Adequate nutrition will be maintained 09/30/2019 1320 by Hubert Azure, RN Outcome: Adequate for Discharge 09/30/2019 1320 by Hubert Azure, RN Outcome: Adequate for Discharge   Problem: Elimination: Goal: Will not experience complications related to bowel motility 09/30/2019 1320 by Hubert Azure, RN Outcome: Adequate for Discharge 09/30/2019 1320 by Hubert Azure, RN Outcome: Adequate for Discharge   Problem: Pain Managment: Goal: General experience of comfort will improve 09/30/2019 1320 by Hubert Azure, RN Outcome: Adequate for Discharge 09/30/2019 1320 by Hubert Azure, RN Outcome: Adequate for Discharge

## 2019-09-30 NOTE — Progress Notes (Signed)
Patient's discharge was canceled yesterday due to recurrent abdominal pain and vomiting.  It was continued supportive medical therapy, with analgesics and antiemetics.  This morning her symptoms have improved and she is tolerating p.o. diet adequately.  Temperature 98.2, blood pressure 128/83, heart rate 91, respiratory 16, oxygen saturation 100%. Her abdomen is soft nontender.  Plan: Discharge patient home today, follow-up as an outpatient.

## 2019-09-30 NOTE — Plan of Care (Signed)
  Problem: Health Behavior/Discharge Planning: Goal: Ability to manage health-related needs will improve Outcome: Adequate for Discharge   Problem: Clinical Measurements: Goal: Ability to maintain clinical measurements within normal limits will improve Outcome: Adequate for Discharge   Problem: Nutrition: Goal: Adequate nutrition will be maintained Outcome: Adequate for Discharge   Problem: Elimination: Goal: Will not experience complications related to bowel motility Outcome: Adequate for Discharge   Problem: Pain Managment: Goal: General experience of comfort will improve Outcome: Adequate for Discharge

## 2019-10-04 ENCOUNTER — Other Ambulatory Visit: Payer: Self-pay

## 2019-10-04 MED ORDER — METOCLOPRAMIDE HCL 5 MG PO TABS: 5.0000 mg | ORAL_TABLET | Freq: Two times a day (BID) | ORAL | 1 refills | Status: DC

## 2019-10-07 ENCOUNTER — Encounter: Payer: Self-pay | Admitting: Endocrinology

## 2019-10-07 ENCOUNTER — Telehealth: Payer: Self-pay

## 2019-10-07 ENCOUNTER — Other Ambulatory Visit: Payer: Self-pay | Admitting: Gastroenterology

## 2019-10-07 ENCOUNTER — Ambulatory Visit: Payer: 59 | Admitting: Endocrinology

## 2019-10-07 ENCOUNTER — Other Ambulatory Visit: Payer: Self-pay | Admitting: Family Medicine

## 2019-10-07 NOTE — Telephone Encounter (Signed)
Please refer to Dr. Cordelia Pen request below

## 2019-10-07 NOTE — Telephone Encounter (Signed)
Appt today was canceled today d/t being COVID+. Pt was offered VV, however pt stated she preferred to be seen in person since she was wanting a pump. LOV 12/31/18 and before then was 06/18/18. Pt has had the following scheduling history:   10/07/19 - canceled d/t COVID+ but declined VV  01/14/19 - NS  11/25/18 - NS  10/29/18 - canceled  06/17/17 - NS  Routing message to Dr. [name] to determine if he would like to send warning letter d/t scheduling history.

## 2019-10-07 NOTE — Telephone Encounter (Signed)
Warning letter has been mailed

## 2019-10-07 NOTE — Telephone Encounter (Signed)
warning letter please

## 2019-10-09 ENCOUNTER — Emergency Department (HOSPITAL_COMMUNITY): Admission: EM | Admit: 2019-10-09 | Discharge: 2019-10-10 | Discharge status: Against Medical Advice | Payer: 59

## 2019-10-09 ENCOUNTER — Emergency Department (HOSPITAL_COMMUNITY): Admission: EM | Admit: 2019-10-09 | Discharge: 2019-10-09 | Discharge status: Against Medical Advice | Payer: 59

## 2019-10-09 ENCOUNTER — Other Ambulatory Visit: Payer: Self-pay

## 2019-10-09 NOTE — ED Notes (Signed)
Pt called x2 for triage room! No answer x2!

## 2019-10-10 ENCOUNTER — Other Ambulatory Visit: Payer: Self-pay

## 2019-10-10 ENCOUNTER — Encounter (HOSPITAL_COMMUNITY): Payer: Self-pay

## 2019-10-10 ENCOUNTER — Emergency Department (HOSPITAL_COMMUNITY)
Admission: EM | Admit: 2019-10-10 | Discharge: 2019-10-10 | Disposition: A | Discharge status: Home / Self Care | Payer: 59 | Attending: Emergency Medicine | Admitting: Emergency Medicine

## 2019-10-10 DIAGNOSIS — I10 Essential (primary) hypertension: Secondary | ICD-10-CM | POA: Diagnosis not present

## 2019-10-10 DIAGNOSIS — Z794 Long term (current) use of insulin: Secondary | ICD-10-CM | POA: Diagnosis not present

## 2019-10-10 DIAGNOSIS — R000 Tachycardia, unspecified: Secondary | ICD-10-CM | POA: Diagnosis not present

## 2019-10-10 DIAGNOSIS — F12188 Cannabis abuse with other cannabis-induced disorder: Secondary | ICD-10-CM

## 2019-10-10 DIAGNOSIS — Z79899 Other long term (current) drug therapy: Secondary | ICD-10-CM | POA: Diagnosis not present

## 2019-10-10 DIAGNOSIS — Z8616 Personal history of COVID-19: Secondary | ICD-10-CM | POA: Insufficient documentation

## 2019-10-10 DIAGNOSIS — R112 Nausea with vomiting, unspecified: Secondary | ICD-10-CM | POA: Diagnosis present

## 2019-10-10 DIAGNOSIS — E1143 Type 2 diabetes mellitus with diabetic autonomic (poly)neuropathy: Secondary | ICD-10-CM | POA: Diagnosis not present

## 2019-10-10 LAB — CBC
HCT: 31 % — ABNORMAL LOW (ref 36.0–46.0)
Hemoglobin: 9.6 g/dL — ABNORMAL LOW (ref 12.0–15.0)
MCH: 26.4 pg (ref 26.0–34.0)
MCHC: 31 g/dL (ref 30.0–36.0)
MCV: 85.2 fL (ref 80.0–100.0)
Platelets: 214 10*3/uL (ref 150–400)
RBC: 3.64 MIL/uL — ABNORMAL LOW (ref 3.87–5.11)
RDW: 14.5 % (ref 11.5–15.5)
WBC: 5.2 10*3/uL (ref 4.0–10.5)
nRBC: 0 % (ref 0.0–0.2)

## 2019-10-10 LAB — URINALYSIS, ROUTINE W REFLEX MICROSCOPIC
Bilirubin Urine: NEGATIVE
Glucose, UA: 50 mg/dL — AB
Ketones, ur: 5 mg/dL — AB
Nitrite: NEGATIVE
Protein, ur: >=300 mg/dL — AB
RBC / HPF: >50 RBC/hpf — ABNORMAL HIGH (ref 0–5)
Specific Gravity, Urine: 1.017 (ref 1.005–1.030)
pH: 6 (ref 5.0–8.0)

## 2019-10-10 LAB — BETA-HYDROXYBUTYRIC ACID: Beta-Hydroxybutyric Acid: 1.54 mmol/L — ABNORMAL HIGH (ref 0.05–0.27)

## 2019-10-10 LAB — COMPREHENSIVE METABOLIC PANEL
ALT: 39 U/L (ref 0–44)
AST: 29 U/L (ref 15–41)
Albumin: 3.2 g/dL — ABNORMAL LOW (ref 3.5–5.0)
Alkaline Phosphatase: 85 U/L (ref 38–126)
Anion gap: 14 (ref 5–15)
BUN: 31 mg/dL — ABNORMAL HIGH (ref 6–20)
CO2: 22 mmol/L (ref 22–32)
Calcium: 9.2 mg/dL (ref 8.9–10.3)
Chloride: 102 mmol/L (ref 98–111)
Creatinine, Ser: 1.21 mg/dL — ABNORMAL HIGH (ref 0.44–1.00)
GFR calc Af Amer: >60 mL/min (ref >60)
GFR calc non Af Amer: 60 mL/min — ABNORMAL LOW (ref >60)
Glucose, Bld: 246 mg/dL — ABNORMAL HIGH (ref 70–99)
Potassium: 4.5 mmol/L (ref 3.5–5.1)
Sodium: 138 mmol/L (ref 135–145)
Total Bilirubin: 0.6 mg/dL (ref 0.3–1.2)
Total Protein: 7.9 g/dL (ref 6.5–8.1)

## 2019-10-10 LAB — I-STAT BETA HCG BLOOD, ED (MC, WL, AP ONLY): I-stat hCG, quantitative: <5 m[IU]/mL (ref <5)

## 2019-10-10 LAB — MAGNESIUM: Magnesium: 2 mg/dL (ref 1.7–2.4)

## 2019-10-10 LAB — LIPASE, BLOOD: Lipase: 19 U/L (ref 11–51)

## 2019-10-10 MED ORDER — HALOPERIDOL LACTATE 5 MG/ML IJ SOLN
5.0000 mg | Freq: Once | INTRAMUSCULAR | Status: DC
  Filled 2019-10-10: qty 1

## 2019-10-10 MED ORDER — HALOPERIDOL LACTATE 5 MG/ML IJ SOLN
5.0000 mg | Freq: Once | INTRAMUSCULAR | Status: AC
  Administered 2019-10-10: 5 mg via INTRAVENOUS

## 2019-10-10 MED ORDER — FENTANYL CITRATE (PF) 100 MCG/2ML IJ SOLN
50.0000 ug | Freq: Once | INTRAMUSCULAR | Status: AC
  Administered 2019-10-10: 50 ug via INTRAVENOUS

## 2019-10-10 MED ORDER — FENTANYL CITRATE (PF) 100 MCG/2ML IJ SOLN
25.0000 ug | Freq: Once | INTRAMUSCULAR | Status: DC
  Filled 2019-10-10: qty 2

## 2019-10-10 MED ORDER — SODIUM CHLORIDE 0.9% FLUSH
3.0000 mL | Freq: Once | INTRAVENOUS | Status: AC
  Administered 2019-10-10: 3 mL via INTRAVENOUS

## 2019-10-10 MED ORDER — SODIUM CHLORIDE 0.9 % IV BOLUS
1000.0000 mL | Freq: Once | INTRAVENOUS | Status: AC
  Administered 2019-10-10: 1000 mL via INTRAVENOUS

## 2019-10-10 NOTE — Discharge Instructions (Addendum)
Please read the attachment on cannabinoid induced hyperemesis syndrome.  It is imperative that you stop smoking marijuana.  I believe that that is contributing to your symptoms of cyclic vomiting.  You were diagnosed with this condition during your last hospital admission, as well.  Please take your at home Zofran ODT as needed for symptoms of nausea.  Be sure to drink plenty of fluids to avoid dehydration.  Now that your nausea is under control, I encourage you to eat small meals.  Follow-up with your primary care provider regarding today's encounter and for ongoing evaluation and management.  Return to the ED or seek immediate medical attention should you experience any new or worsening symptoms.

## 2019-10-10 NOTE — ED Provider Notes (Signed)
[place] DEPT Provider Note   CSN: [id number] Arrival date & time: 10/10/19  3267     History Chief Complaint  Patient presents with  . Nausea    [name] is a 29 y.o. female with past medical history significant for type I DM, HTN, gastroparesis, and cannabinoid-induced hyperemesis syndrome who presents to the ED with a 3-day history of intractable nausea, vomiting, and upper back pain worse with emesis.  She was recently admitted to the hospital on 09/24/2019 and discharged home on 09/29/2019 for a similar episode of intractable vomiting.  She was instructed to avoid any tetrahydrocannabinol use.  On my exam, she endorses recent cannabis use.  She denies any fevers or chills, chest pain, difficulty breathing, abdominal pain, melena or hematochezia, dysuria, or other urinary symptoms.  She denies any IVDA or history of malignancy.    HPI     Past Medical History:  Diagnosis Date  . Allergy   . Anemia   . Anxiety   . Blood transfusion without reported diagnosis    Dec 2018  . Cataract    right eye  . COVID-19   . Depression   . Diabetes type 1, uncontrolled ([place]) 11/14/2011   Since age 42  . Fibromyalgia   . Gastroparesis   . GERD (gastroesophageal reflux disease)   . Hypertension   . Infection    UTI April 2016    Patient Active Problem List   Diagnosis Date Noted  . AKI (acute kidney injury) ([place]) 09/29/2019  . Cyclic vomiting syndrome 09/24/2019  . COVID-19 virus infection 05/31/2019  . Hypertensive urgency 04/30/2019  . Cannabinoid hyperemesis syndrome 04/30/2019  . Contraception management 08/06/2018  . Cyclical vomiting syndrome 06/07/2018  . Diabetic gastroparesis associated with type 1 diabetes mellitus ([place]) 06/04/2018  . Anxiety 05/31/2018  . Peripheral edema 05/31/2018  . Normocytic anemia 07/10/2017  . GERD (gastroesophageal reflux disease) 06/23/2017  . Intractable nausea and vomiting 04/11/2017  . MDD (major  depressive disorder), recurrent episode, mild ([place]) 02/28/2017  . Diabetes mellitus type 1 ([place]) 04/28/2013  . Protein-calorie malnutrition, severe ([place]) 04/28/2013  . Hypertension associated with diabetes ([place]) 11/14/2011  . Chronic abdominal pain 09/18/2011    Past Surgical History:  Procedure Laterality Date  . ANKLE SURGERY    . CHOLECYSTECTOMY  11/15/2011   Procedure: LAPAROSCOPIC CHOLECYSTECTOMY WITH INTRAOPERATIVE CHOLANGIOGRAM;  Surgeon: [name], MD;  Location: [facility];  Service: General;  Laterality: N/A;  . COLONOSCOPY    . COLONOSCOPY WITH PROPOFOL N/A 06/27/2017   Procedure: COLONOSCOPY WITH PROPOFOL;  Surgeon: Milus Banister, MD;  Location: [facility];  Service: Endoscopy;  Laterality: N/A;  . ESOPHAGOGASTRODUODENOSCOPY  12/03/2011   Procedure: ESOPHAGOGASTRODUODENOSCOPY (EGD);  Surgeon: Beryle Beams, MD;  Location: [facility];  Service: Endoscopy;  Laterality: N/A;  . FLEXIBLE SIGMOIDOSCOPY N/A 03/10/2017   Procedure: FLEXIBLE SIGMOIDOSCOPY;  Surgeon: Carol Ada, MD;  Location: [facility];  Service: Endoscopy;  Laterality: N/A;  . INCISION AND DRAINAGE PERIRECTAL ABSCESS N/A 03/01/2017   Procedure: IRRIGATION AND DEBRIDEMENT PERIRECTAL ABSCESS;  Surgeon: Alphonsa Overall, MD;  Location: [facility];  Service: General;  Laterality: N/A;  . IRRIGATION AND DEBRIDEMENT BUTTOCKS N/A 03/23/2017   Procedure: IRRIGATION AND DEBRIDEMENT BUTTOCKS, SETON PLACEMENT;  Surgeon: [name], MD;  Location: [facility];  Service: General;  Laterality: N/A;  . LAPAROSCOPY  11/23/2011   Procedure: LAPAROSCOPY DIAGNOSTIC;  Surgeon: Edward Jolly, MD;  Location: [facility];  Service: General;  Laterality: N/A;  .  SIGMOIDOSCOPY    . UPPER GASTROINTESTINAL ENDOSCOPY    . WISDOM TOOTH EXTRACTION       OB History    Gravida  1   Para  1   Term      Preterm  1   AB      Living  1     SAB      TAB      Ectopic      Multiple  0   Live Births  1           Family  History  Problem Relation Age of Onset  . Diabetes Mother   . Hypertension Father   . Colon cancer Paternal Grandmother        pt thinks PGM was dx in her 44's  . Diabetes Paternal Grandmother   . Diabetes Maternal Grandmother   . Diabetes Maternal Grandfather   . Diabetes Paternal Grandfather   . Diabetes Other   . Esophageal cancer Neg Hx   . Liver cancer Neg Hx   . Pancreatic cancer Neg Hx   . Stomach cancer Neg Hx   . Rectal cancer Neg Hx     Social History   Tobacco Use  . Smoking status: Never Smoker  . Smokeless tobacco: Never Used  Vaping Use  . Vaping Use: Never used  Substance Use Topics  . Alcohol use: No    Alcohol/week: 0.0 standard drinks  . Drug use: Yes    Types: Marijuana    Home Medications Prior to Admission medications   Medication Sig Start Date End Date Taking? Authorizing Provider  aluminum-magnesium hydroxide-simethicone (MAALOX) 627-035-00 MG/5ML SUSP Take 30 mLs by mouth 3 (three) times daily as needed (indigestion).   Yes [provider]  cyclobenzaprine (FLEXERIL) 10 MG tablet TAKE 1 TABLET(10 MG) BY MOUTH THREE TIMES DAILY AS NEEDED FOR MUSCLE SPASMS Patient taking differently: Take 10 mg by mouth 3 (three) times daily as needed for muscle spasms.  10/07/19  Yes [name], MD  dicyclomine (BENTYL) 20 MG tablet Take 1 tablet (20 mg total) by mouth 3 (three) times daily as needed for spasms. 05/01/19  Yes [name], MD  hydrALAZINE (APRESOLINE) 50 MG tablet Take 1 tablet (50 mg total) by mouth every 6 (six) hours. Patient taking differently: Take 50 mg by mouth 2 (two) times daily.  10/23/18  Yes Swayze, Ava, DO  Insulin Glargine (LANTUS SOLOSTAR) 100 UNIT/ML Solostar Pen Inject 10 Units into the skin at bedtime. And pen needles 4/day 12/31/18  Yes Renato Shin, MD  insulin lispro (HUMALOG KWIKPEN) 100 UNIT/ML KwikPen Inject 0.06 mLs (6 Units total) into the skin 3 (three) times daily. Patient taking differently: Inject 1-9  Units into the skin 3 (three) times daily. Per sliding scale 12/31/18  Yes Renato Shin, MD  loperamide (IMODIUM) 2 MG capsule Take 6 mg by mouth daily as needed for diarrhea or loose stools.    Yes [provider]  LORazepam (ATIVAN) 0.5 MG tablet Take 1 tablet (0.5 mg total) by mouth 2 (two) times daily as needed for anxiety. 06/30/19  Yes [name], MD  metoCLOPramide (REGLAN) 5 MG tablet Take 1 tablet (5 mg total) by mouth in the morning and at bedtime. 10/04/19  Yes Armbruster, Carlota Raspberry, MD  metoprolol tartrate (LOPRESSOR) 25 MG tablet Take 1 tablet (25 mg total) by mouth 2 (two) times daily. 05/22/19 10/10/19 Yes Arrien, Jimmy Picket, MD  mirtazapine (REMERON) 30 MG tablet TAKE 1 TABLET  BY MOUTH AT  BEDTIME Patient taking differently: Take 30 mg by mouth at bedtime.  04/19/19  Yes [name], MD  OVER THE COUNTER MEDICATION Take 2 each by mouth daily. Flo Chewable Gummies for period   Yes [provider]  pantoprazole (PROTONIX) 40 MG tablet Take 1 tablet (40 mg total) by mouth daily. 09/16/19  Yes Armbruster, Carlota Raspberry, MD  promethazine (PHENERGAN) 12.5 MG tablet TAKE 1 TABLET(12.5 MG) BY MOUTH EVERY 8 HOURS AS NEEDED FOR NAUSEA OR VOMITING Patient taking differently: Take 12.5 mg by mouth every 8 (eight) hours as needed for nausea or vomiting.  10/07/19  Yes Armbruster, Carlota Raspberry, MD  promethazine (PHENERGAN) 25 MG suppository Place 1 suppository (25 mg total) rectally in the morning and at bedtime. DO NOT take oral phenergan and suppositories at the same time. 08/11/19  Yes Armbruster, Carlota Raspberry, MD  sucralfate (CARAFATE) 1 GM/10ML suspension Take 10 mLs (1 g total) by mouth 4 (four) times daily -  with meals and at bedtime. 06/23/19  Yes Palumbo, April, MD  Continuous Blood Gluc Sensor (DEXCOM G6 SENSOR) MISC 1 each by Does not apply route See admin instructions. every 10 days. 12/31/18   Renato Shin, MD  norethindrone (ORTHO MICRONOR) 0.35 MG tablet Take 1 tablet (0.35 mg  total) by mouth daily. Patient not taking: Reported on 10/10/2019 06/30/19   [name], MD  potassium chloride SA (KLOR-CON) 20 MEQ tablet Take 1 tablet (20 mEq total) by mouth daily for 5 doses. 07/04/19 07/30/19  Janeece Fitting, PA-C    Allergies    Other and Lactose intolerance (gi)  Review of Systems   Review of Systems  All other systems reviewed and are negative.   Physical Exam Updated Vital Signs BP (!) 178/104   Pulse (!) 122   Temp 97.6 F (36.4 C) (Oral)   Resp 17   Ht 5' 7"  (1.702 m)   Wt 54.4 kg   SpO2 100%   BMI 18.79 kg/m   Physical Exam Vitals and nursing note reviewed. Exam conducted with a chaperone present.  Constitutional:      Comments: Actively dry heaving, tearful.  HENT:     Head: Normocephalic and atraumatic.  Eyes:     General: No scleral icterus.    Conjunctiva/sclera: Conjunctivae normal.  Cardiovascular:     Rate and Rhythm: Regular rhythm. Tachycardia present.     Pulses: Normal pulses.     Heart sounds: Normal heart sounds.  Pulmonary:     Effort: Pulmonary effort is normal. No respiratory distress.     Breath sounds: Normal breath sounds.  Abdominal:     Comments: Soft, nondistended.  No areas of TTP.  No guarding.  No black skin changes.  No CVAT.  Musculoskeletal:     Cervical back: Normal range of motion.     Right lower leg: No edema.     Left lower leg: No edema.     Comments: Mild thoracic region TTP bilaterally.  No midline TTP.  No overlying skin changes.  No erythema, warmth, or swelling appreciated.  Skin:    General: Skin is dry.     Capillary Refill: Capillary refill takes less than 2 seconds.  Neurological:     Mental Status: She is alert and oriented to person, place, and time.     GCS: GCS eye subscore is 4. GCS verbal subscore is 5. GCS motor subscore is 6.  Psychiatric:        Mood and  Affect: Mood normal.        Behavior: Behavior normal.        Thought Content: Thought content normal.     ED Results /  Procedures / Treatments   Labs (all labs ordered are listed, but only abnormal results are displayed) Labs Reviewed  COMPREHENSIVE METABOLIC PANEL - Abnormal; Notable for the following components:      Result Value   Glucose, Bld 246 (*)    BUN 31 (*)    Creatinine, Ser 1.21 (*)    Albumin 3.2 (*)    GFR calc non Af Amer 60 (*)    All other components within normal limits  CBC - Abnormal; Notable for the following components:   RBC 3.64 (*)    Hemoglobin 9.6 (*)    HCT 31.0 (*)    All other components within normal limits  URINALYSIS, ROUTINE W REFLEX MICROSCOPIC - Abnormal; Notable for the following components:   Glucose, UA 50 (*)    Hgb urine dipstick MODERATE (*)    Ketones, ur 5 (*)    Protein, ur >=300 (*)    Leukocytes,Ua MODERATE (*)    RBC / HPF >50 (*)    Bacteria, UA RARE (*)    All other components within normal limits  BETA-HYDROXYBUTYRIC ACID - Abnormal; Notable for the following components:   Beta-Hydroxybutyric Acid 1.54 (*)    All other components within normal limits  LIPASE, BLOOD  MAGNESIUM  I-STAT BETA HCG BLOOD, ED ([facility], [facility], [facility] ONLY)    EKG None  Radiology No results found.  Procedures Procedures (including critical care time)  Medications Ordered in ED Medications  sodium chloride flush (NS) 0.9 % injection 3 mL (3 mLs Intravenous Given 10/10/19 1037)  haloperidol lactate (HALDOL) injection 5 mg (5 mg Intravenous Given 10/10/19 0851)  fentaNYL (SUBLIMAZE) injection 50 mcg (50 mcg Intravenous Given 10/10/19 0851)  sodium chloride 0.9 % bolus 1,000 mL (1,000 mLs Intravenous New Bag/Given 10/10/19 1037)    ED Course  I have reviewed the triage vital signs and the nursing notes.  Pertinent labs & imaging results that were available during my care of the patient were reviewed by me and considered in my medical decision making (see chart for details).    MDM Rules/Calculators/A&P                          On subsequent evaluation, patient is  feeling entirely improved after receiving 5 mg Haldol IV for her intractable nausea and vomiting.  She states that her symptoms are improved with warm showers at home and she admits to the recent cannabis use.  Her history of recurrent intractable nausea and vomiting in the setting of marijuana suggests a cannabinoid-induced hyperemesis syndrome.  Emphasized the importance of marijuana cessation to avoid future recurrence.  Labs CBC: Mild anemia with a hemoglobin of 9.6, however improved when compared to baseline. CMP: Hyperglycemia to 246, BUN and creatinine elevated but improved from baseline. Beta hydroxybutyric acid: Mildly elevated at 1.54. Magnesium: WNL. Lipase: WNL. I-STAT hCG: Negative for pregnancy. UA: Hematuria with greater than 50 RBCs and moderate leukocytes.    Patient is currently on her menses and I have low suspicion for infection.  She denies any dysuria, increased frequency, foul odor, or flank pain.  She also denies any fevers or suprapubic discomfort.  Patient was given 1 L IV NS for her tachycardia.  She is likely dehydrated, supported by her history, mild  ketones in urine, and mildly elevated BUN.  Patient's thoracic region back discomfort is subsequent to nausea and vomiting, suspect musculoskeletal etiology.  Reproducible with emesis.  Denies IVDA, fevers, or malignancy.  On subsequent evaluation, patient is feeling sniffily improved after 5 mg of IV Haldol and 50 mcg IV fentanyl.  She states that her pain symptoms have entirely resolved and she is no longer nauseated.  She was p.o. challenged here successfully in the ED.  She feels.  For discharge.  She did receive 1 L IV NS which has improved her elevated heart rate, but she is still mildly tachycardic.  On final examination, she was tachycardic to 102 bpm.  However, she feels prepared for discharge I feel as though that is reasonable.  She does have at home Zofran which she states improves her symptoms.  Plan is for patient  to follow-up with her PCP.  All of the evaluation and work-up results were discussed with the patient and any family at bedside.  Patient and/or family were informed that while patient is appropriate for discharge at this time, some medical emergencies may only develop or become detectable after a period of time.  I specifically instructed patient and/or family to return to return to the ED or seek immediate medical attention for any new or worsening symptoms.  They were provided opportunity to ask any additional questions and have none at this time.  Prior to discharge patient is feeling well, agreeable with plan for discharge home.  They have expressed understanding of verbal discharge instructions as well as return precautions and are agreeable to the plan.    Final Clinical Impression(s) / ED Diagnoses Final diagnoses:  Cannabis hyperemesis syndrome concurrent with and due to cannabis abuse [facility])    Rx / DC Orders ED Discharge Orders    None       Corena Herter, PA-C 10/10/19 1241    Valarie Merino, MD 10/10/19 1443

## 2019-10-10 NOTE — ED Triage Notes (Signed)
Pt reports continues vomiting and back pain. She states that it started about 3 days ago. Patient is tearful in triage. States that she has been unable to keep any medication down.

## 2019-10-11 ENCOUNTER — Encounter (HOSPITAL_COMMUNITY): Payer: Self-pay

## 2019-10-11 ENCOUNTER — Emergency Department (HOSPITAL_COMMUNITY)
Admission: EM | Admit: 2019-10-11 | Discharge: 2019-10-11 | Disposition: A | Discharge status: Home / Self Care | Payer: 59 | Attending: Emergency Medicine | Admitting: Emergency Medicine

## 2019-10-11 ENCOUNTER — Other Ambulatory Visit: Payer: Self-pay

## 2019-10-11 DIAGNOSIS — M549 Dorsalgia, unspecified: Secondary | ICD-10-CM | POA: Insufficient documentation

## 2019-10-11 DIAGNOSIS — Z5321 Procedure and treatment not carried out due to patient leaving prior to being seen by health care provider: Secondary | ICD-10-CM | POA: Diagnosis not present

## 2019-10-11 DIAGNOSIS — R109 Unspecified abdominal pain: Secondary | ICD-10-CM | POA: Insufficient documentation

## 2019-10-11 DIAGNOSIS — R1110 Vomiting, unspecified: Secondary | ICD-10-CM | POA: Diagnosis not present

## 2019-10-11 MED ORDER — SODIUM CHLORIDE 0.9% FLUSH: 3.0000 mL | Freq: Once | INTRAVENOUS | Status: DC

## 2019-10-11 NOTE — ED Triage Notes (Signed)
patient c/o emesis, back and abdominal pain since waking this AM. Patient crying in triage.

## 2019-10-12 ENCOUNTER — Emergency Department (HOSPITAL_COMMUNITY)
Admission: EM | Admit: 2019-10-12 | Discharge: 2019-10-13 | Disposition: A | Discharge status: Home / Self Care | Payer: 59 | Attending: Orthopedic Surgery | Admitting: Orthopedic Surgery

## 2019-10-12 ENCOUNTER — Telehealth: Payer: Self-pay

## 2019-10-12 ENCOUNTER — Other Ambulatory Visit: Payer: Self-pay

## 2019-10-12 ENCOUNTER — Encounter (HOSPITAL_COMMUNITY): Payer: Self-pay | Admitting: Emergency Medicine

## 2019-10-12 ENCOUNTER — Emergency Department (HOSPITAL_COMMUNITY): Admission: EM | Admit: 2019-10-12 | Discharge: 2019-10-12 | Discharge status: Against Medical Advice | Payer: 59

## 2019-10-12 DIAGNOSIS — Z5321 Procedure and treatment not carried out due to patient leaving prior to being seen by health care provider: Secondary | ICD-10-CM | POA: Insufficient documentation

## 2019-10-12 DIAGNOSIS — R112 Nausea with vomiting, unspecified: Secondary | ICD-10-CM | POA: Insufficient documentation

## 2019-10-12 DIAGNOSIS — M545 Low back pain: Secondary | ICD-10-CM | POA: Insufficient documentation

## 2019-10-12 LAB — CBC
HCT: 27.3 % — ABNORMAL LOW (ref 36.0–46.0)
Hemoglobin: 8.8 g/dL — ABNORMAL LOW (ref 12.0–15.0)
MCH: 26.7 pg (ref 26.0–34.0)
MCHC: 32.2 g/dL (ref 30.0–36.0)
MCV: 83 fL (ref 80.0–100.0)
Platelets: 216 10*3/uL (ref 150–400)
RBC: 3.29 MIL/uL — ABNORMAL LOW (ref 3.87–5.11)
RDW: 13.9 % (ref 11.5–15.5)
WBC: 4.4 10*3/uL (ref 4.0–10.5)
nRBC: 0 % (ref 0.0–0.2)

## 2019-10-12 LAB — COMPREHENSIVE METABOLIC PANEL
ALT: 25 U/L (ref 0–44)
AST: 16 U/L (ref 15–41)
Albumin: 2.9 g/dL — ABNORMAL LOW (ref 3.5–5.0)
Alkaline Phosphatase: 77 U/L (ref 38–126)
Anion gap: 9 (ref 5–15)
BUN: 27 mg/dL — ABNORMAL HIGH (ref 6–20)
CO2: 26 mmol/L (ref 22–32)
Calcium: 8.5 mg/dL — ABNORMAL LOW (ref 8.9–10.3)
Chloride: 98 mmol/L (ref 98–111)
Creatinine, Ser: 1.08 mg/dL — ABNORMAL HIGH (ref 0.44–1.00)
GFR calc Af Amer: >60 mL/min (ref >60)
GFR calc non Af Amer: >60 mL/min (ref >60)
Glucose, Bld: 220 mg/dL — ABNORMAL HIGH (ref 70–99)
Potassium: 3.5 mmol/L (ref 3.5–5.1)
Sodium: 133 mmol/L — ABNORMAL LOW (ref 135–145)
Total Bilirubin: 0.5 mg/dL (ref 0.3–1.2)
Total Protein: 7.4 g/dL (ref 6.5–8.1)

## 2019-10-12 LAB — I-STAT BETA HCG BLOOD, ED (MC, WL, AP ONLY): I-stat hCG, quantitative: <5 m[IU]/mL (ref <5)

## 2019-10-12 LAB — LIPASE, BLOOD: Lipase: 20 U/L (ref 11–51)

## 2019-10-12 MED ORDER — SODIUM CHLORIDE 0.9% FLUSH: 3.0000 mL | Freq: Once | INTRAVENOUS | Status: DC

## 2019-10-12 MED ORDER — ONDANSETRON 4 MG PO TBDP: 4.0000 mg | ORAL_TABLET | Freq: Once | ORAL | Status: DC | PRN

## 2019-10-12 NOTE — ED Triage Notes (Signed)
Patient complaining of nausea, vomiting, and back pain since Monday. Patient is crying.

## 2019-10-12 NOTE — Telephone Encounter (Signed)
LMTCB to reschedule 10/31/19 appointment MD will be out of the office

## 2019-10-16 ENCOUNTER — Other Ambulatory Visit: Payer: Self-pay

## 2019-10-16 ENCOUNTER — Emergency Department (HOSPITAL_COMMUNITY)
Admission: EM | Admit: 2019-10-16 | Discharge: 2019-10-16 | Disposition: A | Discharge status: Home / Self Care | Payer: 59 | Attending: Emergency Medicine | Admitting: Emergency Medicine

## 2019-10-16 DIAGNOSIS — I10 Essential (primary) hypertension: Secondary | ICD-10-CM | POA: Insufficient documentation

## 2019-10-16 DIAGNOSIS — R109 Unspecified abdominal pain: Secondary | ICD-10-CM | POA: Diagnosis not present

## 2019-10-16 DIAGNOSIS — F1290 Cannabis use, unspecified, uncomplicated: Secondary | ICD-10-CM | POA: Insufficient documentation

## 2019-10-16 DIAGNOSIS — K219 Gastro-esophageal reflux disease without esophagitis: Secondary | ICD-10-CM | POA: Insufficient documentation

## 2019-10-16 DIAGNOSIS — Z7984 Long term (current) use of oral hypoglycemic drugs: Secondary | ICD-10-CM | POA: Insufficient documentation

## 2019-10-16 DIAGNOSIS — R112 Nausea with vomiting, unspecified: Secondary | ICD-10-CM | POA: Diagnosis not present

## 2019-10-16 DIAGNOSIS — E1043 Type 1 diabetes mellitus with diabetic autonomic (poly)neuropathy: Secondary | ICD-10-CM | POA: Insufficient documentation

## 2019-10-16 LAB — COMPREHENSIVE METABOLIC PANEL
ALT: 29 U/L (ref 0–44)
AST: 34 U/L (ref 15–41)
Albumin: 3.8 g/dL (ref 3.5–5.0)
Alkaline Phosphatase: 92 U/L (ref 38–126)
Anion gap: 12 (ref 5–15)
BUN: 29 mg/dL — ABNORMAL HIGH (ref 6–20)
CO2: 28 mmol/L (ref 22–32)
Calcium: 9.1 mg/dL (ref 8.9–10.3)
Chloride: 95 mmol/L — ABNORMAL LOW (ref 98–111)
Creatinine, Ser: 1.42 mg/dL — ABNORMAL HIGH (ref 0.44–1.00)
GFR calc Af Amer: 57 mL/min — ABNORMAL LOW (ref >60)
GFR calc non Af Amer: 49 mL/min — ABNORMAL LOW (ref >60)
Glucose, Bld: 250 mg/dL — ABNORMAL HIGH (ref 70–99)
Potassium: 4.4 mmol/L (ref 3.5–5.1)
Sodium: 135 mmol/L (ref 135–145)
Total Bilirubin: 0.9 mg/dL (ref 0.3–1.2)
Total Protein: 9.3 g/dL — ABNORMAL HIGH (ref 6.5–8.1)

## 2019-10-16 LAB — CBC WITH DIFFERENTIAL/PLATELET
Abs Immature Granulocytes: 0.01 10*3/uL (ref 0.00–0.07)
Basophils Absolute: 0.1 10*3/uL (ref 0.0–0.1)
Basophils Relative: 1 %
Eosinophils Absolute: 0.1 10*3/uL (ref 0.0–0.5)
Eosinophils Relative: 2 %
HCT: 32.6 % — ABNORMAL LOW (ref 36.0–46.0)
Hemoglobin: 10.2 g/dL — ABNORMAL LOW (ref 12.0–15.0)
Immature Granulocytes: 0 %
Lymphocytes Relative: 44 %
Lymphs Abs: 2.6 10*3/uL (ref 0.7–4.0)
MCH: 26.5 pg (ref 26.0–34.0)
MCHC: 31.3 g/dL (ref 30.0–36.0)
MCV: 84.7 fL (ref 80.0–100.0)
Monocytes Absolute: 0.3 10*3/uL (ref 0.1–1.0)
Monocytes Relative: 5 %
Neutro Abs: 2.8 10*3/uL (ref 1.7–7.7)
Neutrophils Relative %: 48 %
Platelets: 305 10*3/uL (ref 150–400)
RBC: 3.85 MIL/uL — ABNORMAL LOW (ref 3.87–5.11)
RDW: 13.8 % (ref 11.5–15.5)
WBC: 5.8 10*3/uL (ref 4.0–10.5)
nRBC: 0 % (ref 0.0–0.2)

## 2019-10-16 LAB — I-STAT CHEM 8, ED
BUN: 28 mg/dL — ABNORMAL HIGH (ref 6–20)
Calcium, Ion: 1.13 mmol/L — ABNORMAL LOW (ref 1.15–1.40)
Chloride: 94 mmol/L — ABNORMAL LOW (ref 98–111)
Creatinine, Ser: 1.5 mg/dL — ABNORMAL HIGH (ref 0.44–1.00)
Glucose, Bld: 261 mg/dL — ABNORMAL HIGH (ref 70–99)
HCT: 33 % — ABNORMAL LOW (ref 36.0–46.0)
Hemoglobin: 11.2 g/dL — ABNORMAL LOW (ref 12.0–15.0)
Potassium: 3.4 mmol/L — ABNORMAL LOW (ref 3.5–5.1)
Sodium: 135 mmol/L (ref 135–145)
TCO2: 28 mmol/L (ref 22–32)

## 2019-10-16 LAB — I-STAT BETA HCG BLOOD, ED (MC, WL, AP ONLY): I-stat hCG, quantitative: <5 m[IU]/mL (ref <5)

## 2019-10-16 LAB — LIPASE, BLOOD: Lipase: 23 U/L (ref 11–51)

## 2019-10-16 MED ORDER — METOCLOPRAMIDE HCL 5 MG/ML IJ SOLN
10.0000 mg | Freq: Once | INTRAMUSCULAR | Status: AC
  Administered 2019-10-16: 10 mg via INTRAVENOUS
  Filled 2019-10-16: qty 2

## 2019-10-16 MED ORDER — DIPHENHYDRAMINE HCL 50 MG/ML IJ SOLN
25.0000 mg | Freq: Once | INTRAMUSCULAR | Status: AC
  Administered 2019-10-16: 25 mg via INTRAVENOUS
  Filled 2019-10-16: qty 1

## 2019-10-16 MED ORDER — KETOROLAC TROMETHAMINE 30 MG/ML IJ SOLN
30.0000 mg | Freq: Once | INTRAMUSCULAR | Status: AC
  Administered 2019-10-16: 30 mg via INTRAVENOUS
  Filled 2019-10-16: qty 1

## 2019-10-16 MED ORDER — PROMETHAZINE HCL 25 MG/ML IJ SOLN
25.0000 mg | Freq: Once | INTRAMUSCULAR | Status: AC
  Administered 2019-10-16: 25 mg via INTRAVENOUS

## 2019-10-16 MED ORDER — SODIUM CHLORIDE 0.9 % IV BOLUS
1000.0000 mL | Freq: Once | INTRAVENOUS | Status: AC
  Administered 2019-10-16: 1000 mL via INTRAVENOUS

## 2019-10-16 NOTE — ED Triage Notes (Signed)
Patient reports nausea and vomiting starting 2 days ago.

## 2019-10-16 NOTE — ED Provider Notes (Signed)
[facility] DEPT Provider Note   CSN: [id number] Arrival date & time: 10/16/19  0962     History Chief Complaint  Patient presents with  . Nausea  . Emesis    [name] is a 29 y.o. female history of type 1 diabetes, fibromyalgia, gastroparesis, who presented with vomiting.  Patient states that she was unable to keep anything down for the last 2 to 3 days.  She did admit to using marijuana several days ago.  Patient does have a history of cannabis hyperemesis.  Patient went to the ED several times last week for vomiting and improved with IV fluids and nausea medicine.   The history is provided by the patient.       Past Medical History:  Diagnosis Date  . Allergy   . Anemia   . Anxiety   . Blood transfusion without reported diagnosis    Dec 2018  . Cataract    right eye  . COVID-19   . Depression   . Diabetes type 1, uncontrolled ([place]) 11/14/2011   Since age 36  . Fibromyalgia   . Gastroparesis   . GERD (gastroesophageal reflux disease)   . Hypertension   . Infection    UTI April 2016    Patient Active Problem List   Diagnosis Date Noted  . AKI (acute kidney injury) ([place]) 09/29/2019  . Cyclic vomiting syndrome 09/24/2019  . COVID-19 virus infection 05/31/2019  . Hypertensive urgency 04/30/2019  . Cannabinoid hyperemesis syndrome 04/30/2019  . Contraception management 08/06/2018  . Cyclical vomiting syndrome 06/07/2018  . Diabetic gastroparesis associated with type 1 diabetes mellitus ([place]) 06/04/2018  . Anxiety 05/31/2018  . Peripheral edema 05/31/2018  . Normocytic anemia 07/10/2017  . GERD (gastroesophageal reflux disease) 06/23/2017  . Intractable nausea and vomiting 04/11/2017  . MDD (major depressive disorder), recurrent episode, mild ([place]) 02/28/2017  . Diabetes mellitus type 1 ([place]) 04/28/2013  . Protein-calorie malnutrition, severe ([place]) 04/28/2013  . Hypertension associated with diabetes ([place]) 11/14/2011  .  Chronic abdominal pain 09/18/2011    Past Surgical History:  Procedure Laterality Date  . ANKLE SURGERY    . CHOLECYSTECTOMY  11/15/2011   Procedure: LAPAROSCOPIC CHOLECYSTECTOMY WITH INTRAOPERATIVE CHOLANGIOGRAM;  Surgeon: [name], MD;  Location: [facility];  Service: General;  Laterality: N/A;  . COLONOSCOPY    . COLONOSCOPY WITH PROPOFOL N/A 06/27/2017   Procedure: COLONOSCOPY WITH PROPOFOL;  Surgeon: Milus Banister, MD;  Location: [facility];  Service: Endoscopy;  Laterality: N/A;  . ESOPHAGOGASTRODUODENOSCOPY  12/03/2011   Procedure: ESOPHAGOGASTRODUODENOSCOPY (EGD);  Surgeon: Beryle Beams, MD;  Location: [facility];  Service: Endoscopy;  Laterality: N/A;  . FLEXIBLE SIGMOIDOSCOPY N/A 03/10/2017   Procedure: FLEXIBLE SIGMOIDOSCOPY;  Surgeon: Carol Ada, MD;  Location: [facility];  Service: Endoscopy;  Laterality: N/A;  . INCISION AND DRAINAGE PERIRECTAL ABSCESS N/A 03/01/2017   Procedure: IRRIGATION AND DEBRIDEMENT PERIRECTAL ABSCESS;  Surgeon: Alphonsa Overall, MD;  Location: [facility];  Service: General;  Laterality: N/A;  . IRRIGATION AND DEBRIDEMENT BUTTOCKS N/A 03/23/2017   Procedure: IRRIGATION AND DEBRIDEMENT BUTTOCKS, SETON PLACEMENT;  Surgeon: [name], MD;  Location: [facility];  Service: General;  Laterality: N/A;  . LAPAROSCOPY  11/23/2011   Procedure: LAPAROSCOPY DIAGNOSTIC;  Surgeon: Edward Jolly, MD;  Location: [facility];  Service: General;  Laterality: N/A;  . SIGMOIDOSCOPY    . UPPER GASTROINTESTINAL ENDOSCOPY    . WISDOM TOOTH EXTRACTION       OB History  Gravida  1   Para  1   Term      Preterm  1   AB      Living  1     SAB      TAB      Ectopic      Multiple  0   Live Births  1           Family History  Problem Relation Age of Onset  . Diabetes Mother   . Hypertension Father   . Colon cancer Paternal Grandmother        pt thinks PGM was dx in her 29's  . Diabetes Paternal Grandmother   . Diabetes Maternal Grandmother    . Diabetes Maternal Grandfather   . Diabetes Paternal Grandfather   . Diabetes Other   . Esophageal cancer Neg Hx   . Liver cancer Neg Hx   . Pancreatic cancer Neg Hx   . Stomach cancer Neg Hx   . Rectal cancer Neg Hx     Social History   Tobacco Use  . Smoking status: Never Smoker  . Smokeless tobacco: Never Used  Vaping Use  . Vaping Use: Never used  Substance Use Topics  . Alcohol use: No    Alcohol/week: 0.0 standard drinks  . Drug use: Yes    Types: Marijuana    Home Medications Prior to Admission medications   Medication Sig Start Date End Date Taking? Authorizing Provider  aluminum-magnesium hydroxide-simethicone (MAALOX) 109-323-55 MG/5ML SUSP Take 30 mLs by mouth 3 (three) times daily as needed (indigestion).    [provider]  Continuous Blood Gluc Sensor (DEXCOM G6 SENSOR) MISC 1 each by Does not apply route See admin instructions. every 10 days. 12/31/18   Renato Shin, MD  cyclobenzaprine (FLEXERIL) 10 MG tablet TAKE 1 TABLET(10 MG) BY MOUTH THREE TIMES DAILY AS NEEDED FOR MUSCLE SPASMS Patient taking differently: Take 10 mg by mouth 3 (three) times daily as needed for muscle spasms.  10/07/19   [name], MD  dicyclomine (BENTYL) 20 MG tablet Take 1 tablet (20 mg total) by mouth 3 (three) times daily as needed for spasms. 05/01/19   [name], MD  hydrALAZINE (APRESOLINE) 50 MG tablet Take 1 tablet (50 mg total) by mouth every 6 (six) hours. Patient taking differently: Take 50 mg by mouth 2 (two) times daily.  10/23/18   Swayze, Ava, DO  Insulin Glargine (LANTUS SOLOSTAR) 100 UNIT/ML Solostar Pen Inject 10 Units into the skin at bedtime. And pen needles 4/day 12/31/18   Renato Shin, MD  insulin lispro (HUMALOG KWIKPEN) 100 UNIT/ML KwikPen Inject 0.06 mLs (6 Units total) into the skin 3 (three) times daily. Patient taking differently: Inject 1-9 Units into the skin 3 (three) times daily. Per sliding scale 12/31/18   Renato Shin, MD    loperamide (IMODIUM) 2 MG capsule Take 6 mg by mouth daily as needed for diarrhea or loose stools.     [provider]  LORazepam (ATIVAN) 0.5 MG tablet Take 1 tablet (0.5 mg total) by mouth 2 (two) times daily as needed for anxiety. 06/30/19   [name], MD  metoCLOPramide (REGLAN) 5 MG tablet Take 1 tablet (5 mg total) by mouth in the morning and at bedtime. 10/04/19   Armbruster, Carlota Raspberry, MD  metoprolol tartrate (LOPRESSOR) 25 MG tablet Take 1 tablet (25 mg total) by mouth 2 (two) times daily. 05/22/19 10/10/19  Arrien, Jimmy Picket, MD  mirtazapine (REMERON) 30 MG tablet  TAKE 1 TABLET BY MOUTH AT  BEDTIME Patient taking differently: Take 30 mg by mouth at bedtime.  04/19/19   [name], MD  norethindrone (ORTHO MICRONOR) 0.35 MG tablet Take 1 tablet (0.35 mg total) by mouth daily. Patient not taking: Reported on 10/10/2019 06/30/19   [name], MD  OVER THE COUNTER MEDICATION Take 2 each by mouth daily. Flo Chewable Gummies for period    [provider]  pantoprazole (PROTONIX) 40 MG tablet Take 1 tablet (40 mg total) by mouth daily. 09/16/19   Armbruster, Carlota Raspberry, MD  promethazine (PHENERGAN) 12.5 MG tablet TAKE 1 TABLET(12.5 MG) BY MOUTH EVERY 8 HOURS AS NEEDED FOR NAUSEA OR VOMITING Patient taking differently: Take 12.5 mg by mouth every 8 (eight) hours as needed for nausea or vomiting.  10/07/19   Armbruster, Carlota Raspberry, MD  promethazine (PHENERGAN) 25 MG suppository Place 1 suppository (25 mg total) rectally in the morning and at bedtime. DO NOT take oral phenergan and suppositories at the same time. 08/11/19   Armbruster, Carlota Raspberry, MD  sucralfate (CARAFATE) 1 GM/10ML suspension Take 10 mLs (1 g total) by mouth 4 (four) times daily -  with meals and at bedtime. 06/23/19   Palumbo, April, MD  potassium chloride SA (KLOR-CON) 20 MEQ tablet Take 1 tablet (20 mEq total) by mouth daily for 5 doses. 07/04/19 07/30/19  Janeece Fitting, PA-C    Allergies    Other and Lactose  intolerance (gi)  Review of Systems   Review of Systems  Gastrointestinal: Positive for abdominal pain and vomiting.  All other systems reviewed and are negative.   Physical Exam Updated Vital Signs BP (!) 185/113   Pulse (!) 105   Resp 16   Ht 5' 7"  (1.702 m)   Wt 54.4 kg   SpO2 100%   BMI 18.79 kg/m   Physical Exam Vitals and nursing note reviewed.  HENT:     Head: Normocephalic.     Nose: Nose normal.     Mouth/Throat:     Mouth: Mucous membranes are dry.  Eyes:     Extraocular Movements: Extraocular movements intact.     Pupils: Pupils are equal, round, and reactive to light.  Cardiovascular:     Rate and Rhythm: Normal rate and regular rhythm.     Pulses: Normal pulses.     Heart sounds: Normal heart sounds.  Pulmonary:     Effort: Pulmonary effort is normal.     Breath sounds: Normal breath sounds.  Abdominal:     General: Abdomen is flat.     Palpations: Abdomen is soft.  Musculoskeletal:        General: Normal range of motion.     Cervical back: Normal range of motion.  Skin:    General: Skin is warm.     Capillary Refill: Capillary refill takes less than 2 seconds.  Neurological:     General: No focal deficit present.     Mental Status: She is alert and oriented to person, place, and time.  Psychiatric:        Mood and Affect: Mood normal.        Behavior: Behavior normal.     ED Results / Procedures / Treatments   Labs (all labs ordered are listed, but only abnormal results are displayed) Labs Reviewed  CBC WITH DIFFERENTIAL/PLATELET - Abnormal; Notable for the following components:      Result Value   RBC 3.85 (*)    Hemoglobin 10.2 (*)  HCT 32.6 (*)    All other components within normal limits  COMPREHENSIVE METABOLIC PANEL - Abnormal; Notable for the following components:   Chloride 95 (*)    Glucose, Bld 250 (*)    BUN 29 (*)    Creatinine, Ser 1.42 (*)    Total Protein 9.3 (*)    GFR calc non Af Amer 49 (*)    GFR calc Af Amer 57  (*)    All other components within normal limits  I-STAT CHEM 8, ED - Abnormal; Notable for the following components:   Potassium 3.4 (*)    Chloride 94 (*)    BUN 28 (*)    Creatinine, Ser 1.50 (*)    Glucose, Bld 261 (*)    Calcium, Ion 1.13 (*)    Hemoglobin 11.2 (*)    HCT 33.0 (*)    All other components within normal limits  LIPASE, BLOOD  URINALYSIS, ROUTINE W REFLEX MICROSCOPIC  I-STAT BETA HCG BLOOD, ED ([facility], [facility], [facility] ONLY)    EKG None  Radiology No results found.  Procedures Procedures (including critical care time)  Medications Ordered in ED Medications  promethazine (PHENERGAN) injection 25 mg (25 mg Intravenous Given 10/16/19 1721)  sodium chloride 0.9 % bolus 1,000 mL (0 mLs Intravenous Stopped 10/16/19 1821)  metoCLOPramide (REGLAN) injection 10 mg (10 mg Intravenous Given 10/16/19 1721)  diphenhydrAMINE (BENADRYL) injection 25 mg (25 mg Intravenous Given 10/16/19 1720)  ketorolac (TORADOL) 30 MG/ML injection 30 mg (30 mg Intravenous Given 10/16/19 1721)    ED Course  I have reviewed the triage vital signs and the nursing notes.  Pertinent labs & imaging results that were available during my care of the patient were reviewed by me and considered in my medical decision making (see chart for details).    MDM Rules/Calculators/A&P                          FALYNN AILEY is a 29 y.o. female here with abdominal pain and vomiting.  Likely from gastroparesis or cannabis hyperemesis.  Plan to get CBC, CMP, lipase.  Will give IV fluid and antiemetics and reassess.  6:51 PM Patient has mild AKI with creatinine 4.2.  Patient given IV fluids in the ED and antiemetics.  Felt better in the ED and no vomiting.  Patient is hypertensive but has a history of hypertension.  Patient's glucose is 250 but normal anion gap.  Patient states that she has antiemetics at home.  Stable for discharge.  Final Clinical Impression(s) / ED Diagnoses Final diagnoses:  None    Rx / DC  Orders ED Discharge Orders    None       Drenda Freeze, MD 10/16/19 [phone]

## 2019-10-16 NOTE — Discharge Instructions (Signed)
Stay hydrated   Continue taking your allergy medicine at home.  See your doctor   Return to ER if you have worse abdominal pain, vomiting, fevers.

## 2019-10-21 ENCOUNTER — Other Ambulatory Visit: Payer: Self-pay | Admitting: *Deleted

## 2019-10-21 NOTE — Patient Outreach (Signed)
[facility]) Care Management  11/17/6013  [name] [birth year] [id number]   Subjective: Telephone call to patient's home  / mobile number, no answer, left HIPAA compliant voicemail message, and requested call back.   Objective: Per KPN (Knowledge Performance Now, point of care tool) and chart review, patient has had 72 ED visits (unknown timeframe), with last visit on 10/16/2019 for gastroparesis or cannabis hyperemesis.   Patient has had 2 hospitalizations ( (unknown timeframe).   Patient hospitalized  09/24/2019 - 09/29/2019 for intractable nausea vomiting complicated with DKA and hospitalized 05/31/2019 - 06/03/2019 for acute on chronic nausea/vomiting (Intractable), abdominal pain.  Patient also has a history of hypertension, diabetes, Diabetic gastroparesis, Cyclical vomiting syndrome,  COVID-19 virus infection, Intractable cyclical vomiting with nausea, chronic back pain, depression, Acute kidney injury, Severe protein calorie malnutrition, cataract right eye, Fibromyalgia, and MDD (major depressive disorder).      Assessment: Received [employer] referral on 10/12/2019.  Referral reason: 42 ED visits.   Screening  follow up pending patient contact.       Plan: RNCM will send unsuccessful outreach  letter, [facility] pamphlet, will call patient for 2nd telephone outreach attempt within 4 business days, screening follow up, and will proceed with case closure, after 4th unsuccessful outreach call.      [name], BSN, [place] [facility] Telephonic CM Phone: [phone] Fax: [phone]

## 2019-10-24 ENCOUNTER — Ambulatory Visit: Payer: Self-pay | Admitting: *Deleted

## 2019-10-31 ENCOUNTER — Ambulatory Visit: Payer: 59 | Admitting: Endocrinology

## 2019-10-31 ENCOUNTER — Ambulatory Visit: Payer: Self-pay | Admitting: *Deleted

## 2019-11-06 ENCOUNTER — Other Ambulatory Visit: Payer: Self-pay | Admitting: Gastroenterology

## 2019-11-08 ENCOUNTER — Telehealth: Payer: Self-pay

## 2019-11-08 NOTE — Telephone Encounter (Signed)
..   LAST APPOINTMENT DATE: 10/07/2019   NEXT APPOINTMENT DATE:@Visit  date not found  MEDICATION:dicyclomine (BENTYL) 20 MG tablet   PHARMACY:[id number] - [place], [place] - [address] AT [address]   Pt is requesting a referral to a psychiatrist because she believes she is having problems with ADHD.

## 2019-11-09 ENCOUNTER — Other Ambulatory Visit: Payer: Self-pay

## 2019-11-09 MED ORDER — DICYCLOMINE HCL 20 MG PO TABS: 20.0000 mg | ORAL_TABLET | Freq: Three times a day (TID) | ORAL | 0 refills | Status: DC | PRN

## 2019-11-09 NOTE — Telephone Encounter (Signed)
Rx sent in

## 2019-11-10 ENCOUNTER — Ambulatory Visit: Payer: Self-pay | Admitting: *Deleted

## 2019-11-17 ENCOUNTER — Ambulatory Visit: Payer: 59 | Admitting: Family Medicine

## 2019-11-17 DIAGNOSIS — Z0289 Encounter for other administrative examinations: Secondary | ICD-10-CM

## 2019-11-18 ENCOUNTER — Telehealth (INDEPENDENT_AMBULATORY_CARE_PROVIDER_SITE_OTHER): Payer: 59 | Admitting: Family Medicine

## 2019-11-18 ENCOUNTER — Emergency Department (HOSPITAL_COMMUNITY): Payer: 59

## 2019-11-18 ENCOUNTER — Emergency Department (HOSPITAL_BASED_OUTPATIENT_CLINIC_OR_DEPARTMENT_OTHER): Admit: 2019-11-18 | Discharge: 2019-11-18 | Disposition: A | Discharge status: Home / Self Care | Payer: 59

## 2019-11-18 ENCOUNTER — Encounter: Payer: Self-pay | Admitting: Family Medicine

## 2019-11-18 ENCOUNTER — Encounter (HOSPITAL_COMMUNITY): Payer: Self-pay | Admitting: Emergency Medicine

## 2019-11-18 ENCOUNTER — Emergency Department (HOSPITAL_COMMUNITY)
Admission: EM | Admit: 2019-11-18 | Discharge: 2019-11-18 | Disposition: A | Discharge status: Home / Self Care | Payer: 59 | Attending: Emergency Medicine | Admitting: Emergency Medicine

## 2019-11-18 VITALS — Temp 98.8°F | Ht 67.0 in | Wt 130.0 lb

## 2019-11-18 DIAGNOSIS — D649 Anemia, unspecified: Secondary | ICD-10-CM | POA: Diagnosis not present

## 2019-11-18 DIAGNOSIS — F330 Major depressive disorder, recurrent, mild: Secondary | ICD-10-CM

## 2019-11-18 DIAGNOSIS — R609 Edema, unspecified: Secondary | ICD-10-CM | POA: Diagnosis not present

## 2019-11-18 DIAGNOSIS — Z794 Long term (current) use of insulin: Secondary | ICD-10-CM | POA: Diagnosis not present

## 2019-11-18 DIAGNOSIS — E875 Hyperkalemia: Secondary | ICD-10-CM | POA: Diagnosis not present

## 2019-11-18 DIAGNOSIS — M25562 Pain in left knee: Secondary | ICD-10-CM | POA: Diagnosis not present

## 2019-11-18 DIAGNOSIS — M79605 Pain in left leg: Secondary | ICD-10-CM | POA: Diagnosis present

## 2019-11-18 DIAGNOSIS — F419 Anxiety disorder, unspecified: Secondary | ICD-10-CM

## 2019-11-18 DIAGNOSIS — I10 Essential (primary) hypertension: Secondary | ICD-10-CM | POA: Insufficient documentation

## 2019-11-18 DIAGNOSIS — E109 Type 1 diabetes mellitus without complications: Secondary | ICD-10-CM | POA: Insufficient documentation

## 2019-11-18 DIAGNOSIS — Z79899 Other long term (current) drug therapy: Secondary | ICD-10-CM | POA: Diagnosis not present

## 2019-11-18 DIAGNOSIS — R2243 Localized swelling, mass and lump, lower limb, bilateral: Secondary | ICD-10-CM | POA: Insufficient documentation

## 2019-11-18 DIAGNOSIS — M7989 Other specified soft tissue disorders: Secondary | ICD-10-CM

## 2019-11-18 DIAGNOSIS — R41840 Attention and concentration deficit: Secondary | ICD-10-CM

## 2019-11-18 LAB — COMPREHENSIVE METABOLIC PANEL
ALT: 14 U/L (ref 0–44)
AST: 17 U/L (ref 15–41)
Albumin: 2.8 g/dL — ABNORMAL LOW (ref 3.5–5.0)
Alkaline Phosphatase: 118 U/L (ref 38–126)
Anion gap: 8 (ref 5–15)
BUN: 26 mg/dL — ABNORMAL HIGH (ref 6–20)
CO2: 23 mmol/L (ref 22–32)
Calcium: 8.6 mg/dL — ABNORMAL LOW (ref 8.9–10.3)
Chloride: 102 mmol/L (ref 98–111)
Creatinine, Ser: 1.04 mg/dL — ABNORMAL HIGH (ref 0.44–1.00)
GFR calc Af Amer: >60 mL/min (ref >60)
GFR calc non Af Amer: >60 mL/min (ref >60)
Glucose, Bld: 128 mg/dL — ABNORMAL HIGH (ref 70–99)
Potassium: 5.8 mmol/L — ABNORMAL HIGH (ref 3.5–5.1)
Sodium: 133 mmol/L — ABNORMAL LOW (ref 135–145)
Total Bilirubin: 0.2 mg/dL — ABNORMAL LOW (ref 0.3–1.2)
Total Protein: 7.7 g/dL (ref 6.5–8.1)

## 2019-11-18 LAB — POCT I-STAT, CHEM 8
BUN: 25 mg/dL — ABNORMAL HIGH (ref 6–20)
Calcium, Ion: 1.21 mmol/L (ref 1.15–1.40)
Chloride: 101 mmol/L (ref 98–111)
Creatinine, Ser: 1 mg/dL (ref 0.44–1.00)
Glucose, Bld: 133 mg/dL — ABNORMAL HIGH (ref 70–99)
HCT: 25 % — ABNORMAL LOW (ref 36.0–46.0)
Hemoglobin: 8.5 g/dL — ABNORMAL LOW (ref 12.0–15.0)
Potassium: 5.6 mmol/L — ABNORMAL HIGH (ref 3.5–5.1)
Sodium: 135 mmol/L (ref 135–145)
TCO2: 25 mmol/L (ref 22–32)

## 2019-11-18 LAB — POC OCCULT BLOOD, ED: Fecal Occult Bld: NEGATIVE

## 2019-11-18 LAB — CBC WITH DIFFERENTIAL/PLATELET
Abs Immature Granulocytes: 0.01 10*3/uL (ref 0.00–0.07)
Basophils Absolute: 0 10*3/uL (ref 0.0–0.1)
Basophils Relative: 1 %
Eosinophils Absolute: 0.3 10*3/uL (ref 0.0–0.5)
Eosinophils Relative: 6 %
HCT: 26.3 % — ABNORMAL LOW (ref 36.0–46.0)
Hemoglobin: 8.1 g/dL — ABNORMAL LOW (ref 12.0–15.0)
Immature Granulocytes: 0 %
Lymphocytes Relative: 30 %
Lymphs Abs: 1.6 10*3/uL (ref 0.7–4.0)
MCH: 26.7 pg (ref 26.0–34.0)
MCHC: 30.8 g/dL (ref 30.0–36.0)
MCV: 86.8 fL (ref 80.0–100.0)
Monocytes Absolute: 0.4 10*3/uL (ref 0.1–1.0)
Monocytes Relative: 8 %
Neutro Abs: 3 10*3/uL (ref 1.7–7.7)
Neutrophils Relative %: 55 %
Platelets: 217 10*3/uL (ref 150–400)
RBC: 3.03 MIL/uL — ABNORMAL LOW (ref 3.87–5.11)
RDW: 12.9 % (ref 11.5–15.5)
WBC: 5.3 10*3/uL (ref 4.0–10.5)
nRBC: 0 % (ref 0.0–0.2)

## 2019-11-18 LAB — HCG, QUANTITATIVE, PREGNANCY: hCG, Beta Chain, Quant, S: <1 m[IU]/mL (ref <5)

## 2019-11-18 IMAGING — CR DG KNEE COMPLETE 4+V*L*
4 series · 4 of 4 positions shown · non-contrast
Comparison: [DATE].

CLINICAL DATA: Leg pain and swelling beginning last night.

EXAM:
LEFT KNEE - COMPLETE 4+ VIEW

[t knee ap left]
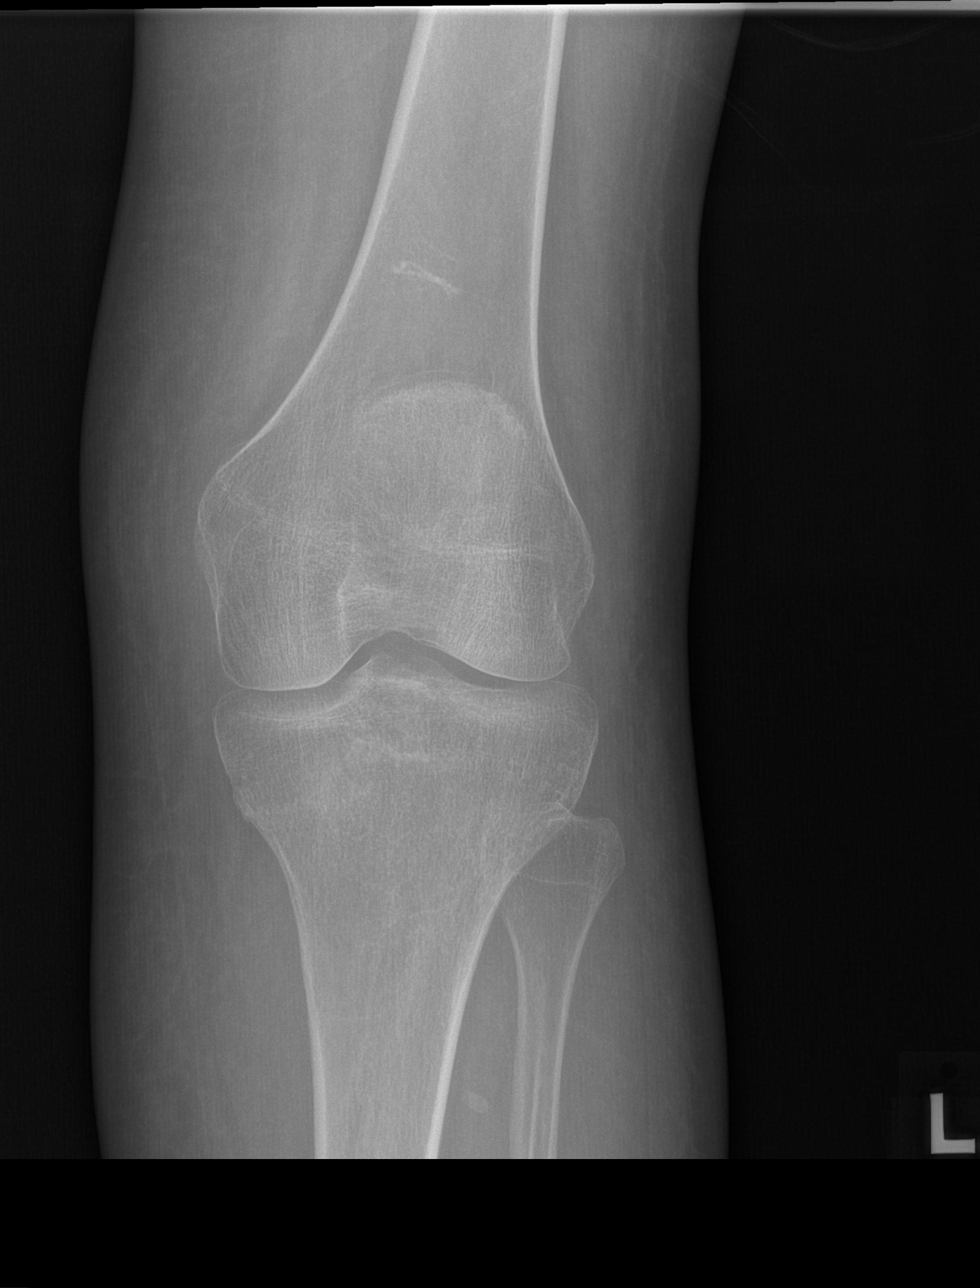

[t knee obl left (1 of 2)]
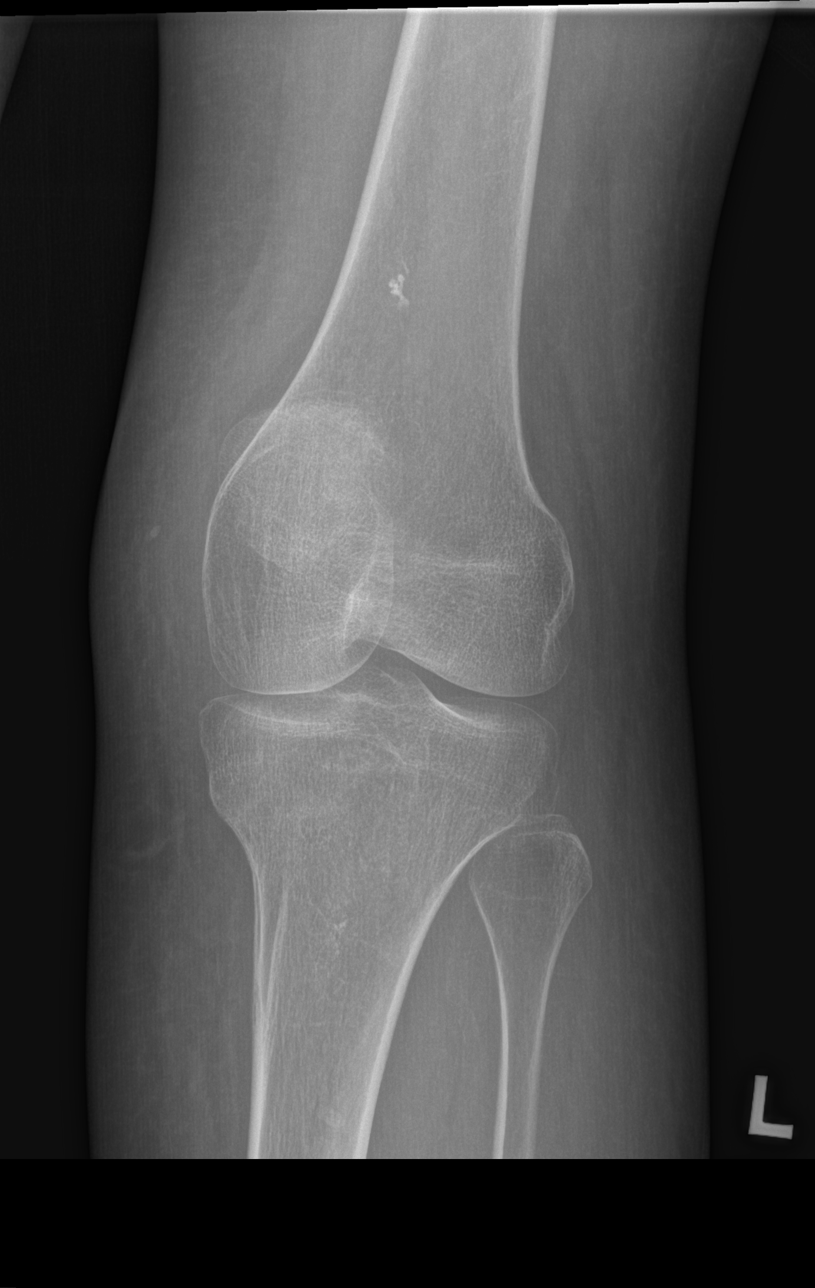

[t knee obl left (2 of 2)]
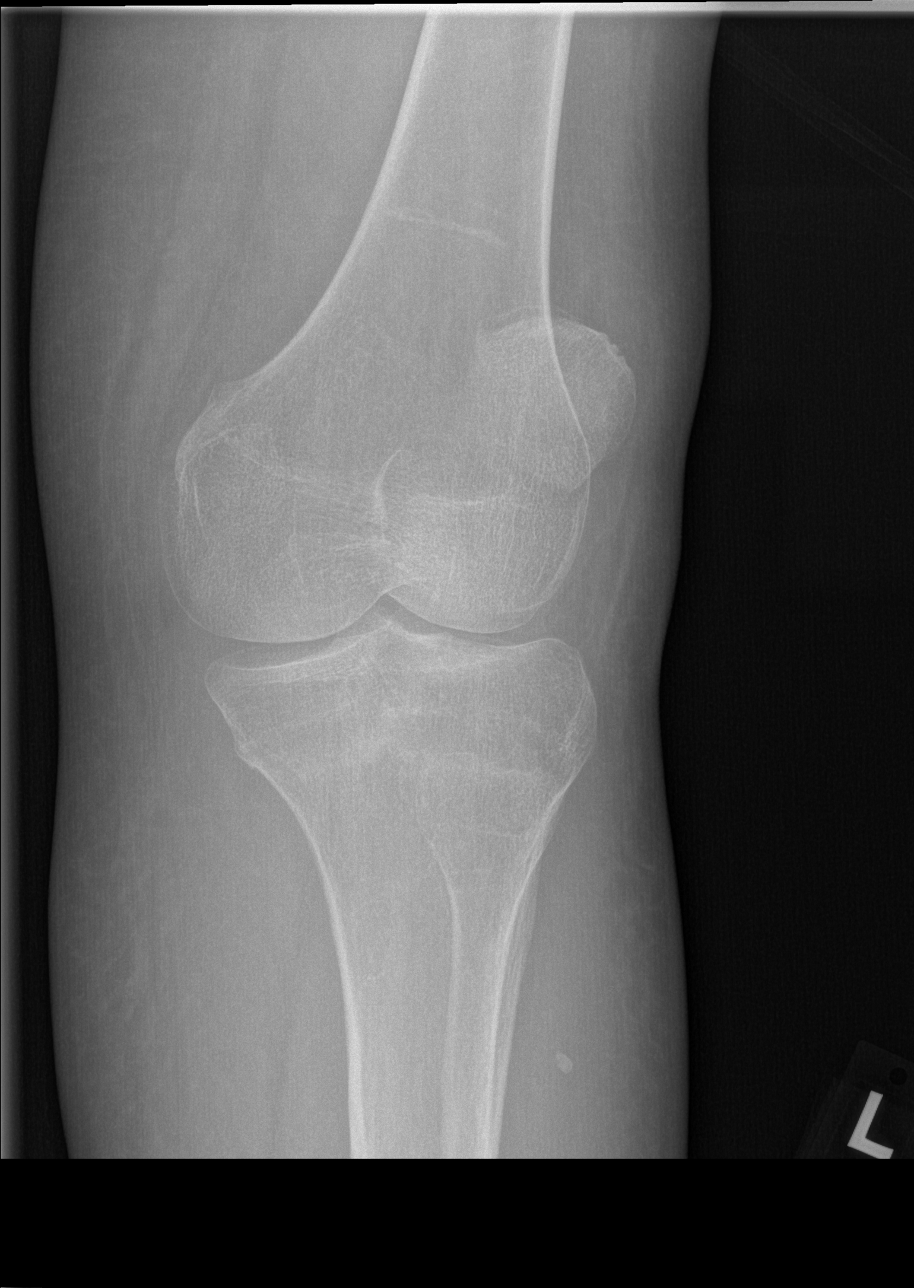

[t knee lat left]
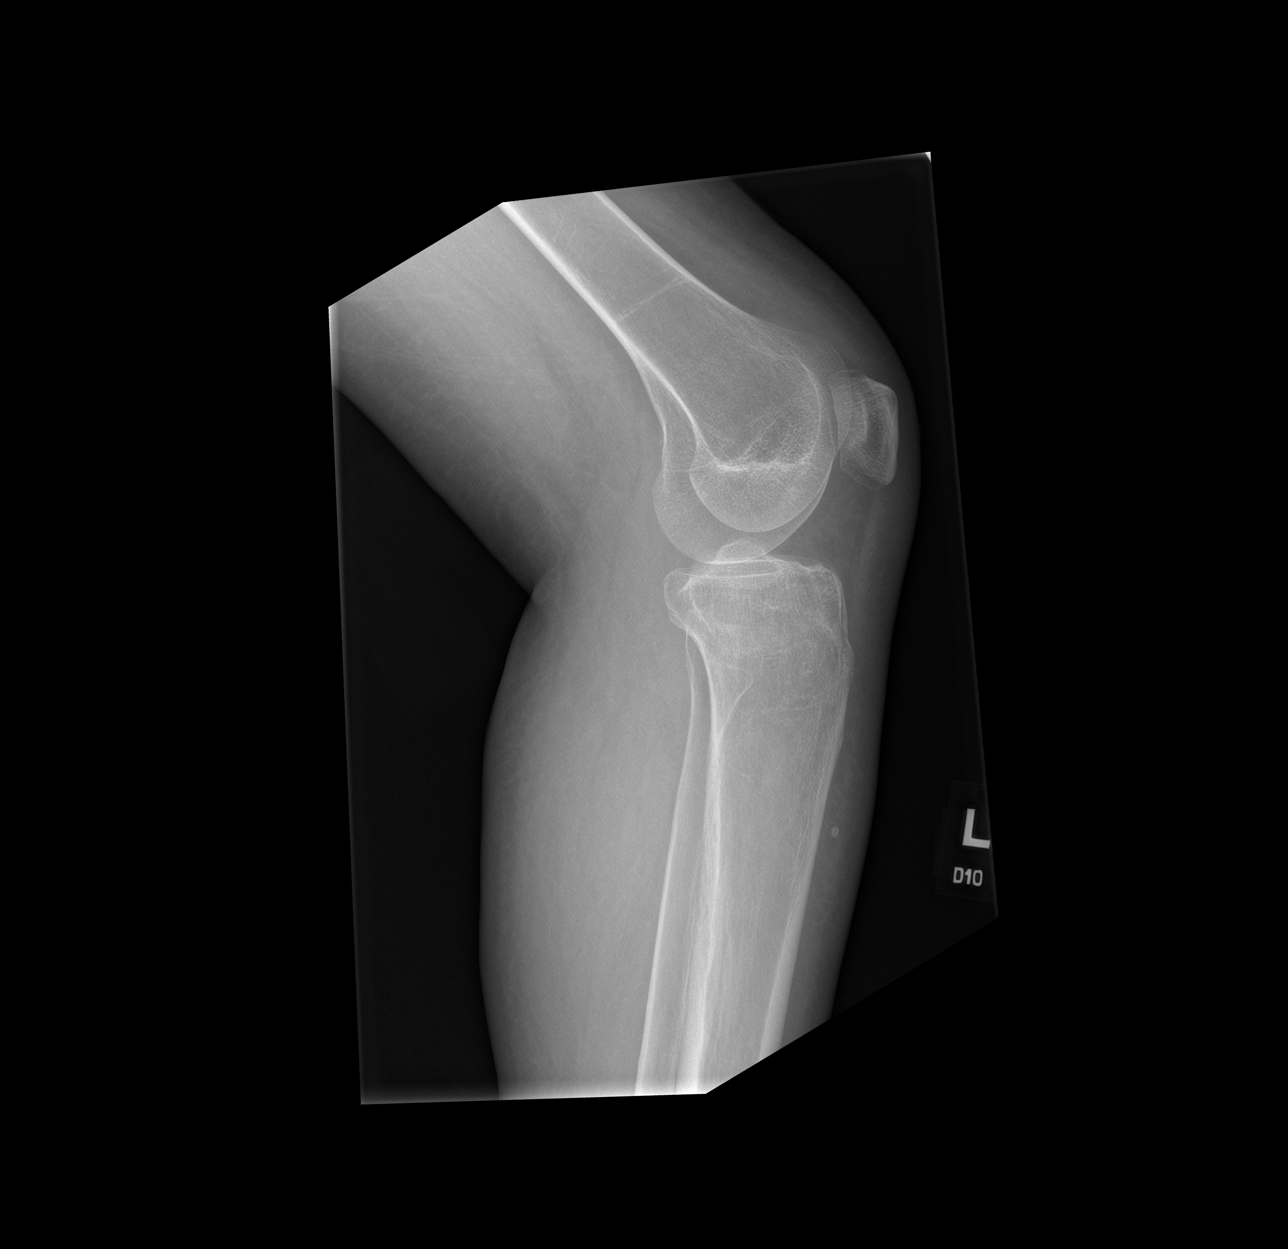

[4 of 4 positions shown; findings below may reference images not displayed]

FINDINGS: No evidence of fracture, dislocation, or joint effusion. No evidence
of arthropathy or other focal bone abnormality. Soft tissues are
unremarkable.
IMPRESSION: Negative.

## 2019-11-18 MED ORDER — SODIUM ZIRCONIUM CYCLOSILICATE 5 G PO PACK
5.0000 g | PACK | Freq: Once | ORAL | Status: AC
  Administered 2019-11-18: 5 g via ORAL
  Filled 2019-11-18: qty 1

## 2019-11-18 NOTE — Assessment & Plan Note (Signed)
Stable.  Continue Remeron 30 mg daily.

## 2019-11-18 NOTE — ED Provider Notes (Signed)
[facility] DEPT Provider Note   CSN: [id number] Arrival date & time: 11/18/19  1321     History Chief Complaint  Patient presents with  . Leg Swelling  . Leg Pain    [name] is a 29 y.o. female.  The history is provided by the patient and medical records. No language interpreter was used.  Leg Pain    29 year old female significant history of type 1 diabetes, fibromyalgias, hypertension, anemia, anxiety, previously diagnosed with COVID-19 presenting complaining of left leg pain and swelling.  Patient report 2 weeks ago she fell landed on her left knee.  She has some pain initially that seems to be improving.  Does have intermittent sharp pain to her anterior knee worse with ambulating.  For the past 2 days she has noticed increased swelling about left lower extremity.  She described it as a tightness sensation with tenderness primarily to the anterior aspect of her lower extremity.  She denies any chest pain or shortness of breath no productive cough no hemoptysis she denies back pain or hip pain.  She denies any new injuries.  She denies any numbness.  No specific treatment tried.  No prior history of PE or DVT no recent surgery or prolonged bedrest no active cancer.  Patient Covid infection back in March and May.  Past Medical History:  Diagnosis Date  . Allergy   . Anemia   . Anxiety   . Blood transfusion without reported diagnosis    Dec 2018  . Cataract    right eye  . COVID-19   . Depression   . Diabetes type 1, uncontrolled ([place]) 11/14/2011   Since age 25  . Fibromyalgia   . Gastroparesis   . GERD (gastroesophageal reflux disease)   . Hypertension   . Infection    UTI April 2016    Patient Active Problem List   Diagnosis Date Noted  . Hypertensive urgency 04/30/2019  . Cannabinoid hyperemesis syndrome 04/30/2019  . Contraception management 08/06/2018  . Cyclical vomiting syndrome 06/07/2018  . Diabetic gastroparesis  associated with type 1 diabetes mellitus ([place]) 06/04/2018  . Anxiety 05/31/2018  . Peripheral edema 05/31/2018  . Normocytic anemia 07/10/2017  . GERD (gastroesophageal reflux disease) 06/23/2017  . Intractable nausea and vomiting 04/11/2017  . MDD (major depressive disorder), recurrent episode, mild ([place]) 02/28/2017  . Diabetes mellitus type 1 ([place]) 04/28/2013  . Protein-calorie malnutrition, severe ([place]) 04/28/2013  . Hypertension associated with diabetes ([place]) 11/14/2011  . Chronic abdominal pain 09/18/2011    Past Surgical History:  Procedure Laterality Date  . ANKLE SURGERY    . CHOLECYSTECTOMY  11/15/2011   Procedure: LAPAROSCOPIC CHOLECYSTECTOMY WITH INTRAOPERATIVE CHOLANGIOGRAM;  Surgeon: [name], MD;  Location: [facility];  Service: General;  Laterality: N/A;  . COLONOSCOPY    . COLONOSCOPY WITH PROPOFOL N/A 06/27/2017   Procedure: COLONOSCOPY WITH PROPOFOL;  Surgeon: Milus Banister, MD;  Location: [facility];  Service: Endoscopy;  Laterality: N/A;  . ESOPHAGOGASTRODUODENOSCOPY  12/03/2011   Procedure: ESOPHAGOGASTRODUODENOSCOPY (EGD);  Surgeon: Beryle Beams, MD;  Location: [facility];  Service: Endoscopy;  Laterality: N/A;  . FLEXIBLE SIGMOIDOSCOPY N/A 03/10/2017   Procedure: FLEXIBLE SIGMOIDOSCOPY;  Surgeon: Carol Ada, MD;  Location: [facility];  Service: Endoscopy;  Laterality: N/A;  . INCISION AND DRAINAGE PERIRECTAL ABSCESS N/A 03/01/2017   Procedure: IRRIGATION AND DEBRIDEMENT PERIRECTAL ABSCESS;  Surgeon: Alphonsa Overall, MD;  Location: [facility];  Service: General;  Laterality: N/A;  . IRRIGATION AND DEBRIDEMENT  BUTTOCKS N/A 03/23/2017   Procedure: IRRIGATION AND DEBRIDEMENT BUTTOCKS, SETON PLACEMENT;  Surgeon: [name], MD;  Location: [facility];  Service: General;  Laterality: N/A;  . LAPAROSCOPY  11/23/2011   Procedure: LAPAROSCOPY DIAGNOSTIC;  Surgeon: Edward Jolly, MD;  Location: [facility];  Service: General;  Laterality: N/A;  . SIGMOIDOSCOPY    .  UPPER GASTROINTESTINAL ENDOSCOPY    . WISDOM TOOTH EXTRACTION       OB History    Gravida  1   Para  1   Term      Preterm  1   AB      Living  1     SAB      TAB      Ectopic      Multiple  0   Live Births  1           Family History  Problem Relation Age of Onset  . Diabetes Mother   . Hypertension Father   . Colon cancer Paternal Grandmother        pt thinks PGM was dx in her 16's  . Diabetes Paternal Grandmother   . Diabetes Maternal Grandmother   . Diabetes Maternal Grandfather   . Diabetes Paternal Grandfather   . Diabetes Other   . Esophageal cancer Neg Hx   . Liver cancer Neg Hx   . Pancreatic cancer Neg Hx   . Stomach cancer Neg Hx   . Rectal cancer Neg Hx     Social History   Tobacco Use  . Smoking status: Never Smoker  . Smokeless tobacco: Never Used  Vaping Use  . Vaping Use: Never used  Substance Use Topics  . Alcohol use: No    Alcohol/week: 0.0 standard drinks  . Drug use: Yes    Types: Marijuana    Home Medications Prior to Admission medications   Medication Sig Start Date End Date Taking? Authorizing Provider  aluminum-magnesium hydroxide-simethicone (MAALOX) 026-378-58 MG/5ML SUSP Take 30 mLs by mouth 3 (three) times daily as needed (indigestion).    [provider]  Continuous Blood Gluc Sensor (DEXCOM G6 SENSOR) MISC 1 each by Does not apply route See admin instructions. every 10 days. 12/31/18   Renato Shin, MD  cyclobenzaprine (FLEXERIL) 10 MG tablet TAKE 1 TABLET(10 MG) BY MOUTH THREE TIMES DAILY AS NEEDED FOR MUSCLE SPASMS Patient taking differently: Take 10 mg by mouth 3 (three) times daily as needed for muscle spasms.  10/07/19   [name], MD  dicyclomine (BENTYL) 20 MG tablet Take 1 tablet (20 mg total) by mouth 3 (three) times daily as needed for spasms. 11/09/19   [name], MD  hydrALAZINE (APRESOLINE) 50 MG tablet Take 1 tablet (50 mg total) by mouth every 6 (six) hours. Patient taking  differently: Take 50 mg by mouth 2 (two) times daily.  10/23/18   Swayze, Ava, DO  Insulin Glargine (LANTUS SOLOSTAR) 100 UNIT/ML Solostar Pen Inject 10 Units into the skin at bedtime. And pen needles 4/day 12/31/18   Renato Shin, MD  insulin lispro (HUMALOG KWIKPEN) 100 UNIT/ML KwikPen Inject 0.06 mLs (6 Units total) into the skin 3 (three) times daily. Patient taking differently: Inject 1-9 Units into the skin 3 (three) times daily. Per sliding scale 12/31/18   Renato Shin, MD  loperamide (IMODIUM) 2 MG capsule Take 6 mg by mouth daily as needed for diarrhea or loose stools.     [provider]  LORazepam (ATIVAN) 0.5 MG tablet Take  1 tablet (0.5 mg total) by mouth 2 (two) times daily as needed for anxiety. 06/30/19   [name], MD  metoCLOPramide (REGLAN) 5 MG tablet Take 1 tablet (5 mg total) by mouth in the morning and at bedtime. 10/04/19   Armbruster, Carlota Raspberry, MD  metoprolol tartrate (LOPRESSOR) 25 MG tablet Take 1 tablet (25 mg total) by mouth 2 (two) times daily. 05/22/19 10/10/19  Arrien, Jimmy Picket, MD  mirtazapine (REMERON) 30 MG tablet TAKE 1 TABLET BY MOUTH AT  BEDTIME Patient taking differently: Take 30 mg by mouth at bedtime.  04/19/19   [name], MD  pantoprazole (PROTONIX) 40 MG tablet Take 1 tablet (40 mg total) by mouth daily. 09/16/19   Armbruster, Carlota Raspberry, MD  promethazine (PHENERGAN) 12.5 MG tablet TAKE 1 TABLET(12.5 MG) BY MOUTH EVERY 8 HOURS AS NEEDED FOR NAUSEA OR VOMITING Patient taking differently: Take 12.5 mg by mouth every 8 (eight) hours as needed for nausea or vomiting.  10/07/19   Armbruster, Carlota Raspberry, MD  sucralfate (CARAFATE) 1 GM/10ML suspension Take 10 mLs (1 g total) by mouth 4 (four) times daily -  with meals and at bedtime. 06/23/19   Palumbo, April, MD  potassium chloride SA (KLOR-CON) 20 MEQ tablet Take 1 tablet (20 mEq total) by mouth daily for 5 doses. 07/04/19 07/30/19  Janeece Fitting, PA-C    Allergies    Other and Lactose intolerance  (gi)  Review of Systems   Review of Systems  All other systems reviewed and are negative.   Physical Exam Updated Vital Signs BP (!) 145/98 (BP Location: Left Arm)   Pulse (!) 116   Temp 98.5 F (36.9 C) (Oral)   Resp 16   SpO2 97%   Physical Exam Vitals and nursing note reviewed.  Constitutional:      General: She is not in acute distress.    Appearance: She is well-developed.  HENT:     Head: Atraumatic.  Eyes:     Conjunctiva/sclera: Conjunctivae normal.  Cardiovascular:     Rate and Rhythm: Normal rate and regular rhythm.     Pulses: Normal pulses.     Heart sounds: Normal heart sounds.  Pulmonary:     Effort: Pulmonary effort is normal.     Breath sounds: No wheezing, rhonchi or rales.  Abdominal:     Palpations: Abdomen is soft.  Musculoskeletal:        General: Swelling (LLE: 2+ pitting edema extending from foot to knee.  point tenderness to L tibial tuberosity, intact DP+.  no erythema about the leg) and tenderness present.     Cervical back: Neck supple.  Skin:    Findings: No rash.  Neurological:     Mental Status: She is alert and oriented to person, place, and time.  Psychiatric:        Mood and Affect: Mood normal.     ED Results / Procedures / Treatments   Labs (all labs ordered are listed, but only abnormal results are displayed) Labs Reviewed  CBC WITH DIFFERENTIAL/PLATELET - Abnormal; Notable for the following components:      Result Value   RBC 3.03 (*)    Hemoglobin 8.1 (*)    HCT 26.3 (*)    All other components within normal limits  COMPREHENSIVE METABOLIC PANEL - Abnormal; Notable for the following components:   Sodium 133 (*)    Potassium 5.8 (*)    Glucose, Bld 128 (*)    BUN 26 (*)  Creatinine, Ser 1.04 (*)    Calcium 8.6 (*)    Albumin 2.8 (*)    Total Bilirubin 0.2 (*)    All other components within normal limits  HCG, QUANTITATIVE, PREGNANCY  I-STAT CHEM 8, ED  POC OCCULT BLOOD, ED    EKG None  Radiology DG Knee  Complete 4 Views Left  Result Date: 11/18/2019 CLINICAL DATA:  Leg pain and swelling beginning last night. EXAM: LEFT KNEE - COMPLETE 4+ VIEW COMPARISON:  12/13/2011. FINDINGS: No evidence of fracture, dislocation, or joint effusion. No evidence of arthropathy or other focal bone abnormality. Soft tissues are unremarkable. IMPRESSION: Negative. Electronically Signed   By: Nelson Chimes M.D.   On: 11/18/2019 16:52   VAS [place] LOWER EXTREMITY VENOUS (DVT) ([facility] 7a-7p)  Result Date: 11/18/2019  Lower Venous DVTStudy Indications: Edema.  Risk Factors: None identified. Limitations: Poor ultrasound/tissue interface. Comparison Study: No prior studies. Performing Technologist: Oliver Hum RVT  Examination Guidelines: A complete evaluation includes B-mode imaging, spectral Doppler, color Doppler, and power Doppler as needed of all accessible portions of each vessel. Bilateral testing is considered an integral part of a complete examination. Limited examinations for reoccurring indications may be performed as noted. The reflux portion of the exam is performed with the patient in reverse Trendelenburg.  +-----+---------------+---------+-----------+----------+--------------+ RIGHTCompressibilityPhasicitySpontaneityPropertiesThrombus Aging +-----+---------------+---------+-----------+----------+--------------+ CFV  Full           Yes      Yes                                 +-----+---------------+---------+-----------+----------+--------------+   +---------+---------------+---------+-----------+----------+--------------+ LEFT     CompressibilityPhasicitySpontaneityPropertiesThrombus Aging +---------+---------------+---------+-----------+----------+--------------+ CFV      Full           Yes      Yes                                 +---------+---------------+---------+-----------+----------+--------------+ SFJ      Full                                                         +---------+---------------+---------+-----------+----------+--------------+ FV Prox  Full                                                        +---------+---------------+---------+-----------+----------+--------------+ FV Mid   Full                                                        +---------+---------------+---------+-----------+----------+--------------+ FV DistalFull                                                        +---------+---------------+---------+-----------+----------+--------------+ PFV      Full                                                        +---------+---------------+---------+-----------+----------+--------------+  POP      Full           Yes      Yes                                 +---------+---------------+---------+-----------+----------+--------------+ PTV      Full                                                        +---------+---------------+---------+-----------+----------+--------------+ PERO     Full                                                        +---------+---------------+---------+-----------+----------+--------------+     Summary: RIGHT: - No evidence of common femoral vein obstruction.  LEFT: - There is no evidence of deep vein thrombosis in the lower extremity. However, portions of this examination were limited- see technologist comments above.  - No cystic structure found in the popliteal fossa.  *See table(s) above for measurements and observations.    Preliminary     Procedures Procedures (including critical care time)  Medications Ordered in ED Medications  sodium zirconium cyclosilicate (LOKELMA) packet 5 g (5 g Oral Given 11/18/19 2135)    ED Course  I have reviewed the triage vital signs and the nursing notes.  Pertinent labs & imaging results that were available during my care of the patient were reviewed by me and considered in my medical decision making (see chart for details).    MDM  Rules/Calculators/A&P                          BP (!) 158/112   Pulse (!) 103   Temp 98.5 F (36.9 C) (Oral)   Resp 16   SpO2 100%    Final Clinical Impression(s) / ED Diagnoses Final diagnoses:  Leg swelling  Anemia, unspecified type  Hyperkalemia    Rx / DC Orders ED Discharge Orders    None     4:32 PM Pt here with swelling of LLE.  Did fell on her L knee 2 weeks ago and does have mild tenderness to L tibial tuberosity.  Will obtain xray of L knee.  A DVT study of LLE obtained and is negative for acute DVT. No evidence of infectious finding.   8:37 PM X-ray of left knee is unremarkable.  Patient has evidence of anemia with a hemoglobin of 8.1.  A month ago it was 11.2.  However more than a month ago her hemoglobin is 8.8.  Fecal occult blood test is negative.  I encourage patient to follow-up with her primary care provider for further evaluation of her new anemia.  Potassium level is elevated today at 5.6.  Will give Lokelma.  Otherwise, will discharge home with compressive hosing, outpt f/u with PCP for further evaluation and to recheck her Hgb.   Pt with CP or SOB.  Pt was tachycardic but on recheck it has improved. Pt understand to return if her sxs worsen.     Domenic Moras, PA-C 11/18/19 2139    Zenia Resides,  Elberta Fortis, MD 11/22/19 [phone]

## 2019-11-18 NOTE — ED Notes (Signed)
Hospital phlebotomy called at this time to draw blood

## 2019-11-18 NOTE — Progress Notes (Signed)
Left lower extremity venous duplex has been completed. Preliminary results can be found in CV Proc through chart review.  Results were given to Domenic Moras PA.  11/18/19 4:27 PM [name] RVT

## 2019-11-18 NOTE — ED Notes (Signed)
ISTAT Chem8  Was done and resulted, will not cross over in system

## 2019-11-18 NOTE — Discharge Instructions (Addendum)
We have evaluate for your left leg swelling.  Fortunately no evidence of any blood clot in your leg nor are there evidence of any broken bone in your knee.  Please wear compressive stocking, keep leg elevated, and follow-up closely with your doctor for further evaluation.  Your potassium level is high today, please have that rechecked within a week.  Your hemoglobin is 8.1.  This is much lower than your last hemoglobin level.  Have this rechecked by your doctor as well.  Return if you have any concern.

## 2019-11-18 NOTE — ED Triage Notes (Signed)
Per pt, states left leg pain and swelling from knee down since last night-no injury

## 2019-11-18 NOTE — Assessment & Plan Note (Addendum)
Stable.  Continue ativan.  Will place referral to psychiatry.

## 2019-11-18 NOTE — Progress Notes (Signed)
   [name] is a 28 y.o. female who presents today for a virtual office visit.  Assessment/Plan:  Chronic Problems Addressed Today: Anxiety Stable.  Continue ativan.  Will place referral to psychiatry.  MDD (major depressive disorder), recurrent episode, mild ([facility]) Stable.  Continue Remeron 30 mg daily.  Inattention Will place referral to psychiatry.     Subjective:  HPI:  Patient here for virtual visit.  Requests referral to psychiatry.  She has longstanding history of anxiety and depression is currently being treated with Remeron and Ativan.  She also is concerned about possible ADHD symptoms.  She has noticed increasing difficulty with having a hard time staying on task.  She will often forget to take medications.  This is impacting her overall physical health as well as her ability to do things around the house.       Objective/Observations  Physical Exam: Gen: NAD, resting comfortably Pulm: Normal work of breathing Neuro: Grossly normal, moves all extremities Psych: Normal affect and thought content  Virtual Visit via Video   I connected with [name] on 40/98/11 at  3:40 PM EDT by a video enabled telemedicine application and verified that I am speaking with the correct person using two identifiers. The limitations of evaluation and management by telemedicine and the availability of in person appointments were discussed. The patient expressed understanding and agreed to proceed.   Patient location: Home Provider location: [place] participating in the virtual visit: Myself and Patient     Algis Greenhouse. Jerline Pain, MD 11/18/2019 9:47 AM

## 2019-11-18 NOTE — ED Notes (Signed)
Patient transported to X-ray 

## 2019-11-22 ENCOUNTER — Ambulatory Visit: Payer: Self-pay | Admitting: *Deleted

## 2019-11-22 ENCOUNTER — Other Ambulatory Visit: Payer: Self-pay | Admitting: Gastroenterology

## 2019-11-23 ENCOUNTER — Emergency Department (HOSPITAL_COMMUNITY): Payer: 59

## 2019-11-23 ENCOUNTER — Emergency Department (HOSPITAL_COMMUNITY)
Admission: EM | Admit: 2019-11-23 | Discharge: 2019-11-23 | Discharge status: Against Medical Advice | Payer: 59 | Attending: Emergency Medicine | Admitting: Emergency Medicine

## 2019-11-23 ENCOUNTER — Encounter (HOSPITAL_COMMUNITY): Payer: Self-pay

## 2019-11-23 ENCOUNTER — Emergency Department (HOSPITAL_COMMUNITY)
Admission: EM | Admit: 2019-11-23 | Discharge: 2019-11-23 | Disposition: A | Discharge status: Home / Self Care | Payer: 59 | Source: Home / Self Care | Attending: Emergency Medicine | Admitting: Emergency Medicine

## 2019-11-23 DIAGNOSIS — Z8616 Personal history of COVID-19: Secondary | ICD-10-CM | POA: Insufficient documentation

## 2019-11-23 DIAGNOSIS — R1111 Vomiting without nausea: Secondary | ICD-10-CM | POA: Diagnosis not present

## 2019-11-23 DIAGNOSIS — Z5321 Procedure and treatment not carried out due to patient leaving prior to being seen by health care provider: Secondary | ICD-10-CM | POA: Insufficient documentation

## 2019-11-23 DIAGNOSIS — I10 Essential (primary) hypertension: Secondary | ICD-10-CM | POA: Insufficient documentation

## 2019-11-23 DIAGNOSIS — Z794 Long term (current) use of insulin: Secondary | ICD-10-CM | POA: Insufficient documentation

## 2019-11-23 DIAGNOSIS — E1043 Type 1 diabetes mellitus with diabetic autonomic (poly)neuropathy: Secondary | ICD-10-CM | POA: Insufficient documentation

## 2019-11-23 DIAGNOSIS — Z79899 Other long term (current) drug therapy: Secondary | ICD-10-CM | POA: Insufficient documentation

## 2019-11-23 DIAGNOSIS — R112 Nausea with vomiting, unspecified: Secondary | ICD-10-CM | POA: Insufficient documentation

## 2019-11-23 DIAGNOSIS — R1084 Generalized abdominal pain: Secondary | ICD-10-CM | POA: Insufficient documentation

## 2019-11-23 LAB — COMPREHENSIVE METABOLIC PANEL
ALT: 15 U/L (ref 0–44)
AST: 18 U/L (ref 15–41)
Albumin: 2.6 g/dL — ABNORMAL LOW (ref 3.5–5.0)
Alkaline Phosphatase: 117 U/L (ref 38–126)
Anion gap: 14 (ref 5–15)
BUN: 29 mg/dL — ABNORMAL HIGH (ref 6–20)
CO2: 23 mmol/L (ref 22–32)
Calcium: 8.4 mg/dL — ABNORMAL LOW (ref 8.9–10.3)
Chloride: 102 mmol/L (ref 98–111)
Creatinine, Ser: 1.27 mg/dL — ABNORMAL HIGH (ref 0.44–1.00)
GFR calc Af Amer: >60 mL/min (ref >60)
GFR calc non Af Amer: 57 mL/min — ABNORMAL LOW (ref >60)
Glucose, Bld: 256 mg/dL — ABNORMAL HIGH (ref 70–99)
Potassium: 4.4 mmol/L (ref 3.5–5.1)
Sodium: 139 mmol/L (ref 135–145)
Total Bilirubin: 0.5 mg/dL (ref 0.3–1.2)
Total Protein: 7.6 g/dL (ref 6.5–8.1)

## 2019-11-23 LAB — CBC WITH DIFFERENTIAL/PLATELET
Abs Immature Granulocytes: 0.02 10*3/uL (ref 0.00–0.07)
Basophils Absolute: 0 10*3/uL (ref 0.0–0.1)
Basophils Relative: 1 %
Eosinophils Absolute: 0.2 10*3/uL (ref 0.0–0.5)
Eosinophils Relative: 3 %
HCT: 29.3 % — ABNORMAL LOW (ref 36.0–46.0)
Hemoglobin: 9.3 g/dL — ABNORMAL LOW (ref 12.0–15.0)
Immature Granulocytes: 0 %
Lymphocytes Relative: 26 %
Lymphs Abs: 1.4 10*3/uL (ref 0.7–4.0)
MCH: 27.2 pg (ref 26.0–34.0)
MCHC: 31.7 g/dL (ref 30.0–36.0)
MCV: 85.7 fL (ref 80.0–100.0)
Monocytes Absolute: 0.2 10*3/uL (ref 0.1–1.0)
Monocytes Relative: 4 %
Neutro Abs: 3.7 10*3/uL (ref 1.7–7.7)
Neutrophils Relative %: 66 %
Platelets: 276 10*3/uL (ref 150–400)
RBC: 3.42 MIL/uL — ABNORMAL LOW (ref 3.87–5.11)
RDW: 12.9 % (ref 11.5–15.5)
WBC: 5.6 10*3/uL (ref 4.0–10.5)
nRBC: 0 % (ref 0.0–0.2)

## 2019-11-23 LAB — URINALYSIS, ROUTINE W REFLEX MICROSCOPIC
Bilirubin Urine: NEGATIVE
Glucose, UA: >=500 mg/dL — AB
Ketones, ur: 20 mg/dL — AB
Leukocytes,Ua: NEGATIVE
Nitrite: NEGATIVE
Protein, ur: >=300 mg/dL — AB
RBC / HPF: >50 RBC/hpf — ABNORMAL HIGH (ref 0–5)
Specific Gravity, Urine: 1.016 (ref 1.005–1.030)
pH: 7 (ref 5.0–8.0)

## 2019-11-23 LAB — I-STAT BETA HCG BLOOD, ED (MC, WL, AP ONLY): I-stat hCG, quantitative: <5 m[IU]/mL (ref <5)

## 2019-11-23 LAB — RAPID URINE DRUG SCREEN, HOSP PERFORMED
Amphetamines: NOT DETECTED
Barbiturates: NOT DETECTED
Benzodiazepines: POSITIVE — AB
Cocaine: NOT DETECTED
Opiates: NOT DETECTED
Tetrahydrocannabinol: POSITIVE — AB

## 2019-11-23 LAB — LIPASE, BLOOD: Lipase: 18 U/L (ref 11–51)

## 2019-11-23 IMAGING — CT CT ABD-PELV W/O CM
2 of 4 series · 16 of 46 positions shown, 18 images · non-contrast
Comparison: [DATE].

CLINICAL DATA: Acute generalized abdominal pain, vomiting.

EXAM:
CT ABDOMEN AND PELVIS WITHOUT CONTRAST
TECHNIQUE: Multidetector CT imaging of the abdomen and pelvis was performed
following the standard protocol without IV contrast.

[Series 2: axial st · axial · 0.94mm/px · z∈[+1006,+1416]mm · 13 of 94 slices shown, 15 images]
[im 6/94  soft-tissue]
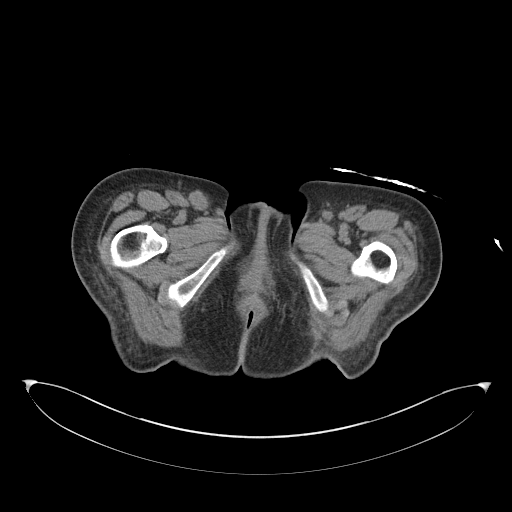
[im 6/94  bone]
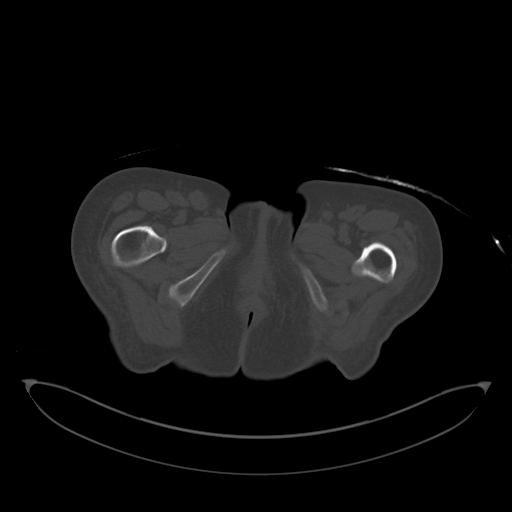
[im 11/94  soft-tissue]
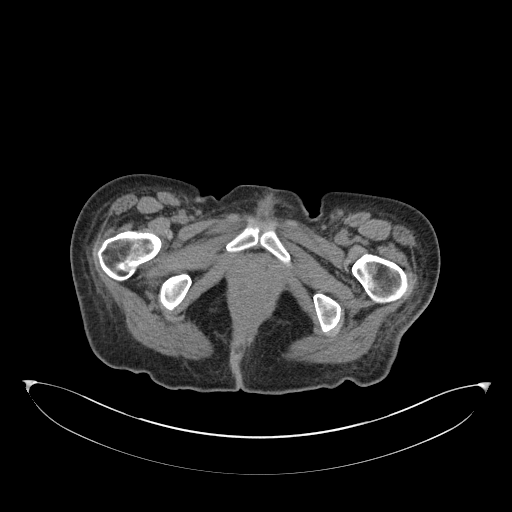
[im 22/94  soft-tissue]
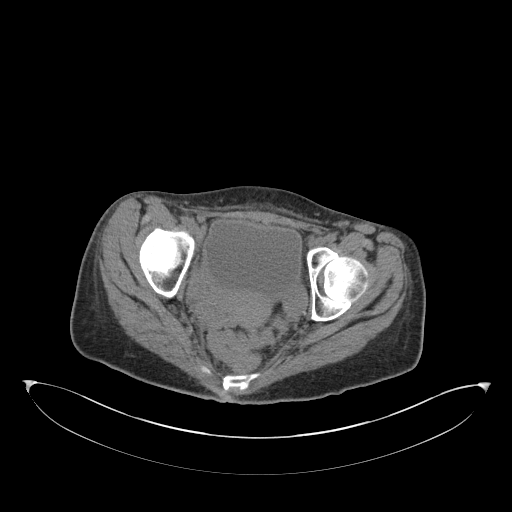
[im 28/94  soft-tissue]
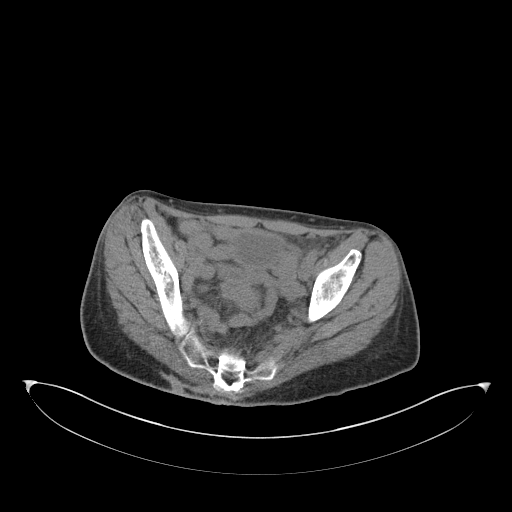
[im 33/94  soft-tissue]
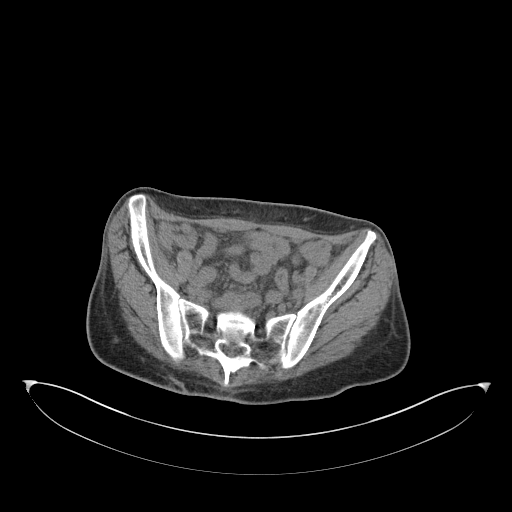
[im 39/94  soft-tissue]
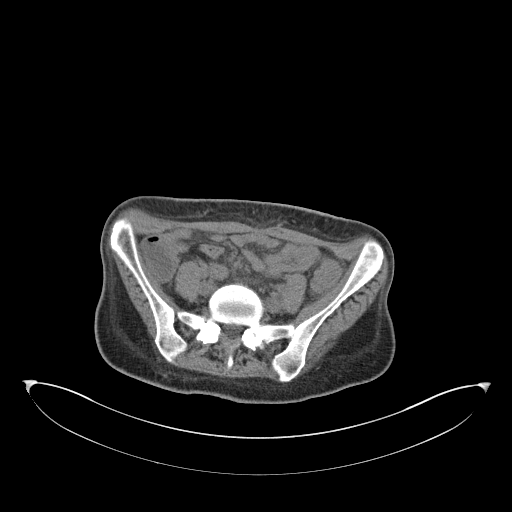
[im 50/94  soft-tissue]
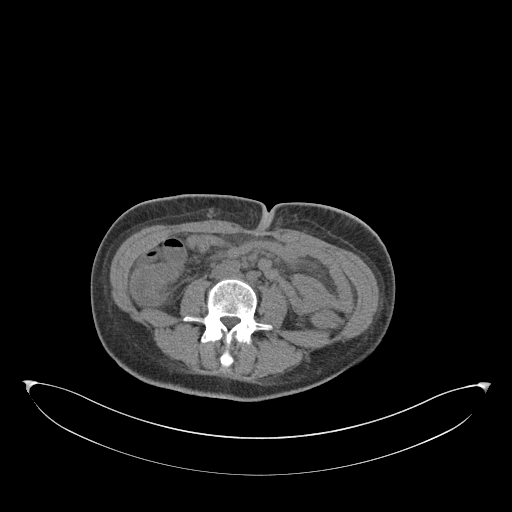
[im 55/94  soft-tissue]
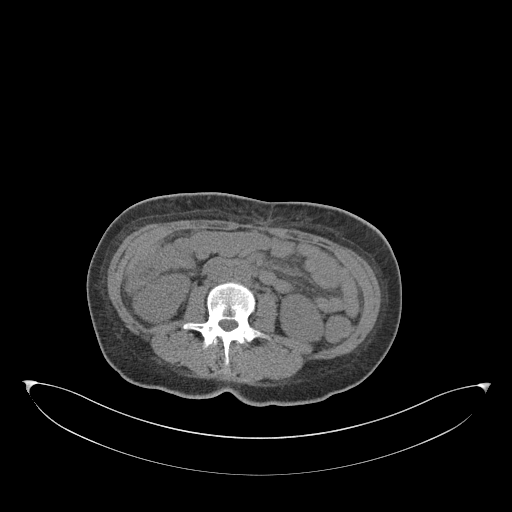
[im 61/94  soft-tissue]
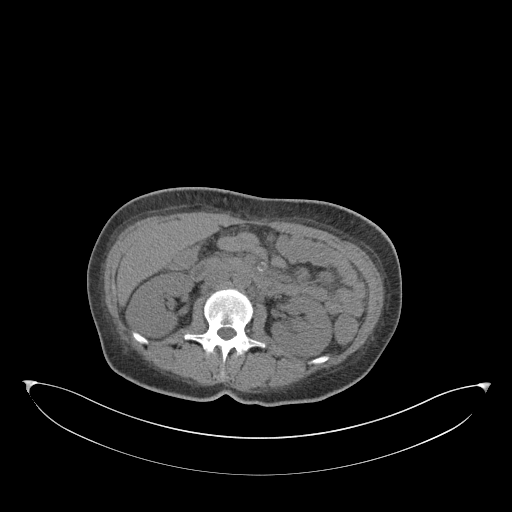
[im 61/94  bone]
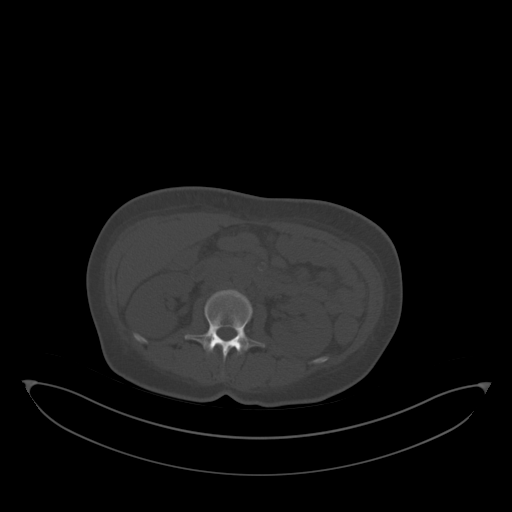
[im 66/94  soft-tissue]
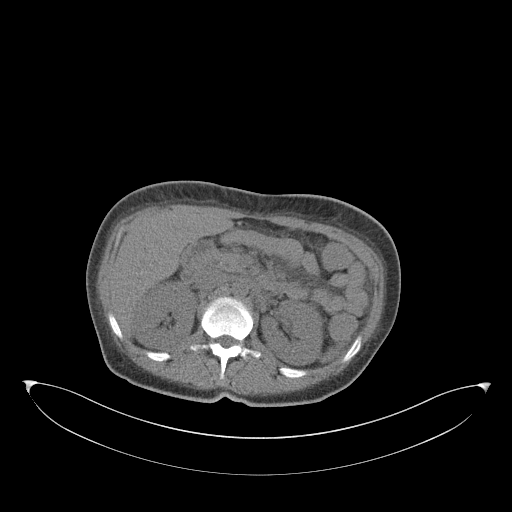
[im 72/94  soft-tissue]
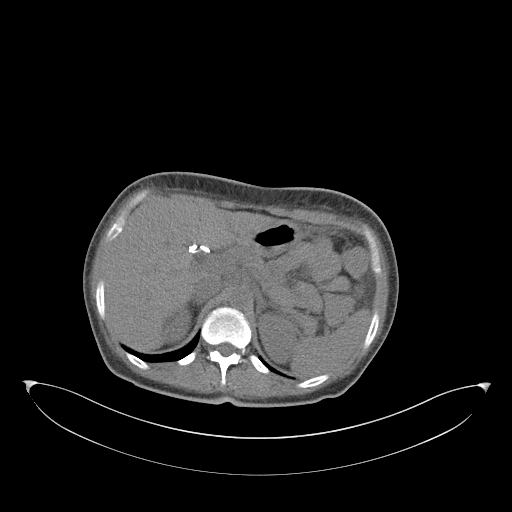
[im 83/94  soft-tissue]
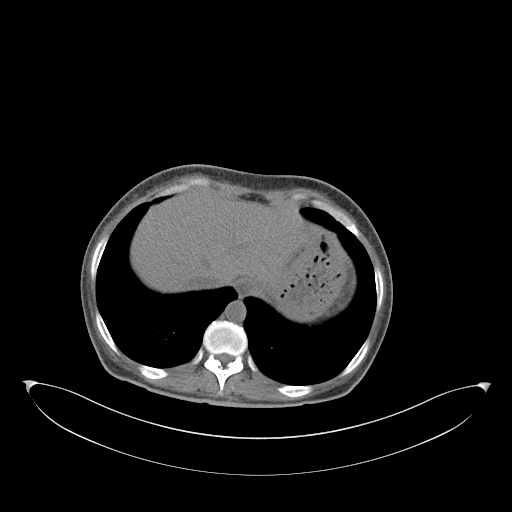
[im 88/94  soft-tissue]
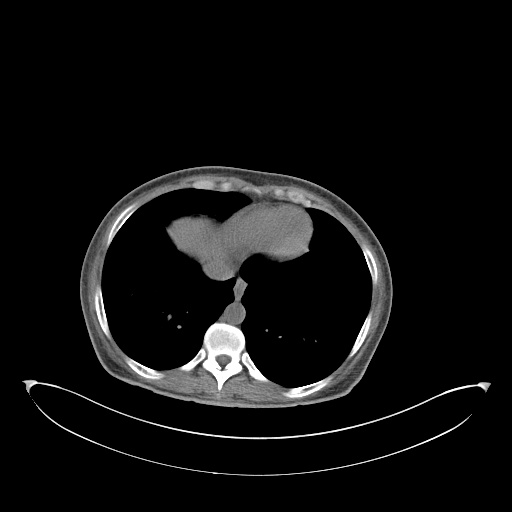

[Series 5: coronal st · coronal · 0.71mm/px · 3 of 142 slices shown]
[im 48/142  soft-tissue]
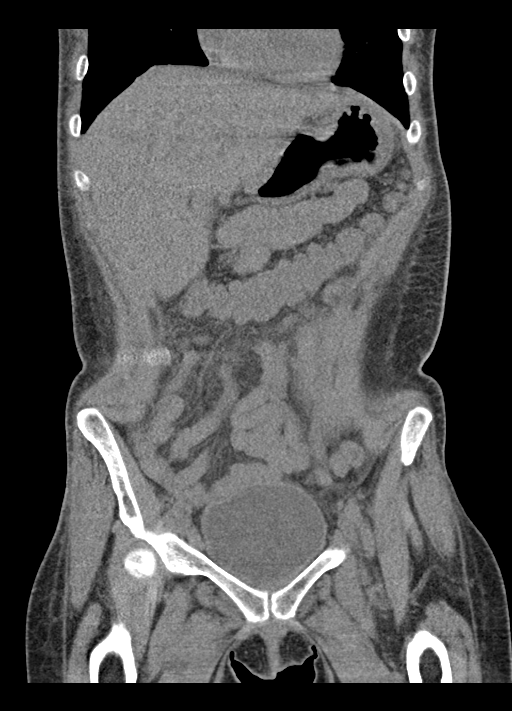
[im 63/142  soft-tissue]
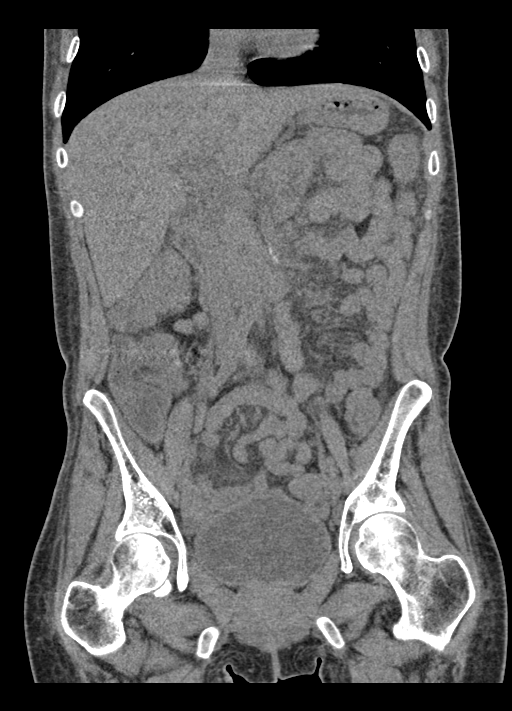
[im 79/142  soft-tissue]
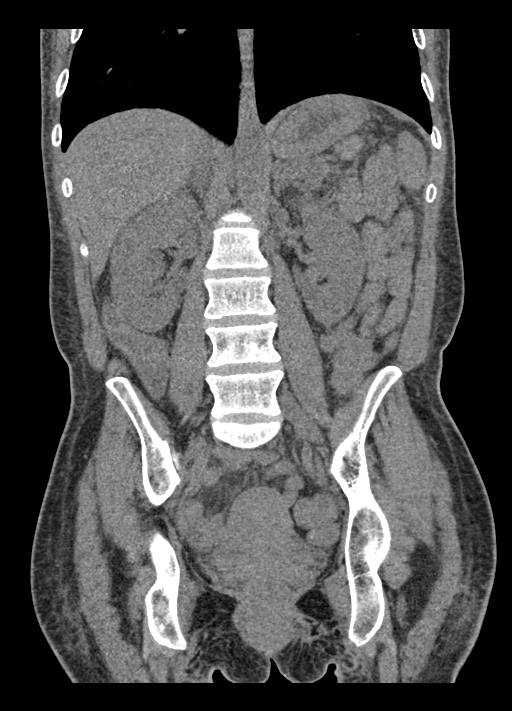

[16 of 46 positions shown; findings below may reference images not displayed]

FINDINGS: Lower chest: No acute abnormality.

Hepatobiliary: No focal liver abnormality is seen. Status post
cholecystectomy. No biliary dilatation.

Pancreas: Unremarkable. No pancreatic ductal dilatation or
surrounding inflammatory changes.

Spleen: Normal in size without focal abnormality.

Adrenals/Urinary Tract: Adrenal glands are unremarkable. Kidneys are
normal, without renal calculi, focal lesion, or hydronephrosis.
Bladder is unremarkable.

Stomach/Bowel: Stomach is within normal limits. Appendix appears
normal. No evidence of bowel wall thickening, distention, or
inflammatory changes.

Vascular/Lymphatic: No significant vascular findings are present. No
enlarged abdominal or pelvic lymph nodes.

Reproductive: Uterus and bilateral adnexa are unremarkable.

Other: No abdominal wall hernia or abnormality. No abdominopelvic
ascites.

Musculoskeletal: No acute or significant osseous findings.
IMPRESSION: No acute abnormality seen in the abdomen or pelvis.

## 2019-11-23 MED ORDER — PROMETHAZINE HCL 25 MG/ML IJ SOLN
25.0000 mg | Freq: Once | INTRAMUSCULAR | Status: AC
  Administered 2019-11-23: 25 mg via INTRAVENOUS
  Filled 2019-11-23: qty 1

## 2019-11-23 MED ORDER — DIPHENHYDRAMINE HCL 50 MG/ML IJ SOLN
25.0000 mg | Freq: Once | INTRAMUSCULAR | Status: AC
  Administered 2019-11-23: 25 mg via INTRAVENOUS
  Filled 2019-11-23: qty 1

## 2019-11-23 MED ORDER — SODIUM CHLORIDE 0.9 % IV BOLUS
1000.0000 mL | Freq: Once | INTRAVENOUS | Status: AC
  Administered 2019-11-23: 1000 mL via INTRAVENOUS

## 2019-11-23 MED ORDER — HALOPERIDOL LACTATE 5 MG/ML IJ SOLN
5.0000 mg | Freq: Once | INTRAMUSCULAR | Status: AC
  Administered 2019-11-23: 5 mg via INTRAVENOUS
  Filled 2019-11-23: qty 1

## 2019-11-23 MED ORDER — ONDANSETRON HCL 4 MG PO TABS: 4.0000 mg | ORAL_TABLET | Freq: Three times a day (TID) | ORAL | 0 refills | Status: DC | PRN

## 2019-11-23 MED ORDER — SODIUM CHLORIDE 0.9 % IV BOLUS
500.0000 mL | Freq: Once | INTRAVENOUS | Status: AC
  Administered 2019-11-23: 500 mL via INTRAVENOUS

## 2019-11-23 MED ORDER — ONDANSETRON HCL 4 MG/2ML IJ SOLN
4.0000 mg | Freq: Once | INTRAMUSCULAR | Status: AC
  Administered 2019-11-23: 4 mg via INTRAVENOUS
  Filled 2019-11-23: qty 2

## 2019-11-23 MED ORDER — FENTANYL CITRATE (PF) 100 MCG/2ML IJ SOLN
50.0000 ug | Freq: Once | INTRAMUSCULAR | Status: AC
  Administered 2019-11-23: 50 ug via INTRAVENOUS
  Filled 2019-11-23: qty 2

## 2019-11-23 NOTE — ED Notes (Signed)
Patient attempted to provide urine sample x 1 w/o success.

## 2019-11-23 NOTE — ED Triage Notes (Signed)
Pt complains of vomiting since yesterday, denies diarrhea

## 2019-11-23 NOTE — Telephone Encounter (Signed)
Pt requesting 1 yr refill on lasix 40 mg once daily. She was last seen in 07-2019. She is currently admitted and had a CMET today.  Please advise.

## 2019-11-23 NOTE — Telephone Encounter (Signed)
I may have refilled this in the past but I think her PCP should be managing this for her long term in light of her other conditions. Would send to PCP for refill. Thanks

## 2019-11-23 NOTE — ED Provider Notes (Signed)
[facility] DEPT Provider Note   CSN: [id number] Arrival date & time: 11/23/19  0515     History Chief Complaint  Patient presents with  . Emesis  . Abdominal Pain    [name] is a 29 y.o. female.  The history is provided by the patient.  Abdominal Pain Pain location:  Generalized Pain quality: aching   Pain radiates to:  Does not radiate Pain severity:  Moderate Onset quality:  Gradual Timing:  Intermittent Progression:  Waxing and waning Chronicity:  New Relieved by:  Nothing Worsened by:  Nothing Associated symptoms: nausea and vomiting   Associated symptoms: no chest pain, no chills, no cough, no dysuria, no fever, no hematuria, no shortness of breath and no sore throat   Risk factors: has not had multiple surgeries   Risk factors comment:  DM, gastroparesis, hyperemesis      Past Medical History:  Diagnosis Date  . Allergy   . Anemia   . Anxiety   . Blood transfusion without reported diagnosis    Dec 2018  . Cataract    right eye  . COVID-19   . Depression   . Diabetes type 1, uncontrolled ([place]) 11/14/2011   Since age 83  . Fibromyalgia   . Gastroparesis   . GERD (gastroesophageal reflux disease)   . Hypertension   . Infection    UTI April 2016    Patient Active Problem List   Diagnosis Date Noted  . Hypertensive urgency 04/30/2019  . Cannabinoid hyperemesis syndrome 04/30/2019  . Contraception management 08/06/2018  . Cyclical vomiting syndrome 06/07/2018  . Diabetic gastroparesis associated with type 1 diabetes mellitus ([place]) 06/04/2018  . Anxiety 05/31/2018  . Peripheral edema 05/31/2018  . Normocytic anemia 07/10/2017  . GERD (gastroesophageal reflux disease) 06/23/2017  . Intractable nausea and vomiting 04/11/2017  . MDD (major depressive disorder), recurrent episode, mild ([place]) 02/28/2017  . Diabetes mellitus type 1 ([place]) 04/28/2013  . Protein-calorie malnutrition, severe ([place]) 04/28/2013  .  Hypertension associated with diabetes ([place]) 11/14/2011  . Chronic abdominal pain 09/18/2011    Past Surgical History:  Procedure Laterality Date  . ANKLE SURGERY    . CHOLECYSTECTOMY  11/15/2011   Procedure: LAPAROSCOPIC CHOLECYSTECTOMY WITH INTRAOPERATIVE CHOLANGIOGRAM;  Surgeon: [name], MD;  Location: [facility];  Service: General;  Laterality: N/A;  . COLONOSCOPY    . COLONOSCOPY WITH PROPOFOL N/A 06/27/2017   Procedure: COLONOSCOPY WITH PROPOFOL;  Surgeon: Milus Banister, MD;  Location: [facility];  Service: Endoscopy;  Laterality: N/A;  . ESOPHAGOGASTRODUODENOSCOPY  12/03/2011   Procedure: ESOPHAGOGASTRODUODENOSCOPY (EGD);  Surgeon: Beryle Beams, MD;  Location: [facility];  Service: Endoscopy;  Laterality: N/A;  . FLEXIBLE SIGMOIDOSCOPY N/A 03/10/2017   Procedure: FLEXIBLE SIGMOIDOSCOPY;  Surgeon: Carol Ada, MD;  Location: [facility];  Service: Endoscopy;  Laterality: N/A;  . INCISION AND DRAINAGE PERIRECTAL ABSCESS N/A 03/01/2017   Procedure: IRRIGATION AND DEBRIDEMENT PERIRECTAL ABSCESS;  Surgeon: Alphonsa Overall, MD;  Location: [facility];  Service: General;  Laterality: N/A;  . IRRIGATION AND DEBRIDEMENT BUTTOCKS N/A 03/23/2017   Procedure: IRRIGATION AND DEBRIDEMENT BUTTOCKS, SETON PLACEMENT;  Surgeon: [name], MD;  Location: [facility];  Service: General;  Laterality: N/A;  . LAPAROSCOPY  11/23/2011   Procedure: LAPAROSCOPY DIAGNOSTIC;  Surgeon: Edward Jolly, MD;  Location: [facility];  Service: General;  Laterality: N/A;  . SIGMOIDOSCOPY    . UPPER GASTROINTESTINAL ENDOSCOPY    . WISDOM TOOTH EXTRACTION  OB History    Gravida  1   Para  1   Term      Preterm  1   AB      Living  1     SAB      TAB      Ectopic      Multiple  0   Live Births  1           Family History  Problem Relation Age of Onset  . Diabetes Mother   . Hypertension Father   . Colon cancer Paternal Grandmother        pt thinks PGM was dx in her 79's  .  Diabetes Paternal Grandmother   . Diabetes Maternal Grandmother   . Diabetes Maternal Grandfather   . Diabetes Paternal Grandfather   . Diabetes Other   . Esophageal cancer Neg Hx   . Liver cancer Neg Hx   . Pancreatic cancer Neg Hx   . Stomach cancer Neg Hx   . Rectal cancer Neg Hx     Social History   Tobacco Use  . Smoking status: Never Smoker  . Smokeless tobacco: Never Used  Vaping Use  . Vaping Use: Never used  Substance Use Topics  . Alcohol use: No    Alcohol/week: 0.0 standard drinks  . Drug use: Yes    Types: Marijuana    Home Medications Prior to Admission medications   Medication Sig Start Date End Date Taking? Authorizing Provider  aluminum-magnesium hydroxide-simethicone (MAALOX) [phone] MG/5ML SUSP Take 30 mLs by mouth 3 (three) times daily as needed (indigestion).   Yes [provider]  Continuous Blood Gluc Sensor (DEXCOM G6 SENSOR) MISC 1 each by Does not apply route See admin instructions. every 10 days. 12/31/18  Yes Renato Shin, MD  cyclobenzaprine (FLEXERIL) 10 MG tablet TAKE 1 TABLET(10 MG) BY MOUTH THREE TIMES DAILY AS NEEDED FOR MUSCLE SPASMS Patient taking differently: Take 10 mg by mouth 3 (three) times daily as needed for muscle spasms.  10/07/19  Yes [name], MD  dicyclomine (BENTYL) 20 MG tablet Take 1 tablet (20 mg total) by mouth 3 (three) times daily as needed for spasms. 11/09/19  Yes [name], MD  hydrALAZINE (APRESOLINE) 50 MG tablet Take 1 tablet (50 mg total) by mouth every 6 (six) hours. Patient taking differently: Take 50 mg by mouth 2 (two) times daily.  10/23/18  Yes Swayze, Ava, DO  Insulin Glargine (LANTUS SOLOSTAR) 100 UNIT/ML Solostar Pen Inject 10 Units into the skin at bedtime. And pen needles 4/day 12/31/18  Yes Renato Shin, MD  insulin lispro (HUMALOG KWIKPEN) 100 UNIT/ML KwikPen Inject 0.06 mLs (6 Units total) into the skin 3 (three) times daily. Patient taking differently: Inject 1-9 Units into the skin  3 (three) times daily. Per sliding scale 12/31/18  Yes Renato Shin, MD  loperamide (IMODIUM) 2 MG capsule Take 6 mg by mouth daily as needed for diarrhea or loose stools.    Yes [provider]  LORazepam (ATIVAN) 0.5 MG tablet Take 1 tablet (0.5 mg total) by mouth 2 (two) times daily as needed for anxiety. 06/30/19  Yes [name], MD  metoCLOPramide (REGLAN) 5 MG tablet Take 1 tablet (5 mg total) by mouth in the morning and at bedtime. 10/04/19  Yes Armbruster, Carlota Raspberry, MD  metoprolol tartrate (LOPRESSOR) 25 MG tablet Take 1 tablet (25 mg total) by mouth 2 (two) times daily. 05/22/19 11/23/19 Yes Arrien, Jimmy Picket, MD  mirtazapine (REMERON) 30 MG tablet TAKE 1 TABLET BY MOUTH AT  BEDTIME Patient taking differently: Take 30 mg by mouth at bedtime.  04/19/19  Yes [name], MD  pantoprazole (PROTONIX) 40 MG tablet Take 1 tablet (40 mg total) by mouth daily. 09/16/19  Yes Armbruster, Carlota Raspberry, MD  promethazine (PHENERGAN) 12.5 MG tablet TAKE 1 TABLET(12.5 MG) BY MOUTH EVERY 8 HOURS AS NEEDED FOR NAUSEA OR VOMITING Patient taking differently: Take 12.5 mg by mouth every 8 (eight) hours as needed for nausea or vomiting.  10/07/19  Yes Armbruster, Carlota Raspberry, MD  sucralfate (CARAFATE) 1 GM/10ML suspension Take 10 mLs (1 g total) by mouth 4 (four) times daily -  with meals and at bedtime. 06/23/19  Yes Palumbo, April, MD  ondansetron (ZOFRAN) 4 MG tablet Take 1 tablet (4 mg total) by mouth every 8 (eight) hours as needed for up to 20 doses for nausea or vomiting. 11/23/19   Jalesa Thien, DO  potassium chloride SA (KLOR-CON) 20 MEQ tablet Take 1 tablet (20 mEq total) by mouth daily for 5 doses. 07/04/19 07/30/19  Janeece Fitting, PA-C    Allergies    Other and Lactose intolerance (gi)  Review of Systems   Review of Systems  Constitutional: Negative for chills and fever.  HENT: Negative for ear pain and sore throat.   Eyes: Negative for pain and visual disturbance.  Respiratory: Negative for  cough and shortness of breath.   Cardiovascular: Negative for chest pain and palpitations.  Gastrointestinal: Positive for abdominal pain, nausea and vomiting.  Genitourinary: Negative for dysuria and hematuria.  Musculoskeletal: Negative for arthralgias and back pain.  Skin: Negative for color change and rash.  Neurological: Negative for seizures and syncope.  All other systems reviewed and are negative.   Physical Exam Updated Vital Signs  ED Triage Vitals  Enc Vitals Group     BP 11/23/19 0527 (!) 136/100     Pulse Rate 11/23/19 0527 (!) 110     Resp 11/23/19 0527 18     Temp 11/23/19 0527 98.7 F (37.1 C)     Temp Source 11/23/19 0527 Oral     SpO2 11/23/19 0527 98 %     Weight 11/23/19 0527 130 lb (59 kg)     Height 11/23/19 0527 5' 7"  (1.702 m)     Head Circumference --      Peak Flow --      Pain Score 11/23/19 0550 10     Pain Loc --      Pain Edu? --      Excl. in [place]? --     Physical Exam Vitals and nursing note reviewed.  Constitutional:      General: She is not in acute distress.    Appearance: She is well-developed. She is ill-appearing.  HENT:     Head: Normocephalic and atraumatic.     Mouth/Throat:     Mouth: Mucous membranes are dry.  Eyes:     Extraocular Movements: Extraocular movements intact.     Conjunctiva/sclera: Conjunctivae normal.     Pupils: Pupils are equal, round, and reactive to light.  Cardiovascular:     Rate and Rhythm: Normal rate and regular rhythm.     Pulses: Normal pulses.     Heart sounds: Normal heart sounds. No murmur heard.   Pulmonary:     Effort: Pulmonary effort is normal. No respiratory distress.     Breath sounds: Normal breath sounds.  Abdominal:     Palpations:  Abdomen is soft.     Tenderness: There is abdominal tenderness.  Musculoskeletal:     Cervical back: Normal range of motion and neck supple.  Skin:    General: Skin is warm and dry.     Capillary Refill: Capillary refill takes less than 2 seconds.    Neurological:     General: No focal deficit present.     Mental Status: She is alert.  Psychiatric:        Mood and Affect: Mood normal.     ED Results / Procedures / Treatments   Labs (all labs ordered are listed, but only abnormal results are displayed) Labs Reviewed  CBC WITH DIFFERENTIAL/PLATELET - Abnormal; Notable for the following components:      Result Value   RBC 3.42 (*)    Hemoglobin 9.3 (*)    HCT 29.3 (*)    All other components within normal limits  URINALYSIS, ROUTINE W REFLEX MICROSCOPIC - Abnormal; Notable for the following components:   Glucose, UA >=500 (*)    Hgb urine dipstick MODERATE (*)    Ketones, ur 20 (*)    Protein, ur >=300 (*)    RBC / HPF >50 (*)    Bacteria, UA RARE (*)    All other components within normal limits  RAPID URINE DRUG SCREEN, HOSP PERFORMED - Abnormal; Notable for the following components:   Benzodiazepines POSITIVE (*)    Tetrahydrocannabinol POSITIVE (*)    All other components within normal limits  COMPREHENSIVE METABOLIC PANEL - Abnormal; Notable for the following components:   Glucose, Bld 256 (*)    BUN 29 (*)    Creatinine, Ser 1.27 (*)    Calcium 8.4 (*)    Albumin 2.6 (*)    GFR calc non Af Amer 57 (*)    All other components within normal limits  LIPASE, BLOOD  I-STAT BETA HCG BLOOD, ED ([facility], [facility], [facility] ONLY)    EKG None  Radiology CT ABDOMEN PELVIS WO CONTRAST  Result Date: 11/23/2019 CLINICAL DATA:  Acute generalized abdominal pain, vomiting. EXAM: CT ABDOMEN AND PELVIS WITHOUT CONTRAST TECHNIQUE: Multidetector CT imaging of the abdomen and pelvis was performed following the standard protocol without IV contrast. COMPARISON:  May 31, 2019. FINDINGS: Lower chest: No acute abnormality. Hepatobiliary: No focal liver abnormality is seen. Status post cholecystectomy. No biliary dilatation. Pancreas: Unremarkable. No pancreatic ductal dilatation or surrounding inflammatory changes. Spleen: Normal in size without focal  abnormality. Adrenals/Urinary Tract: Adrenal glands are unremarkable. Kidneys are normal, without renal calculi, focal lesion, or hydronephrosis. Bladder is unremarkable. Stomach/Bowel: Stomach is within normal limits. Appendix appears normal. No evidence of bowel wall thickening, distention, or inflammatory changes. Vascular/Lymphatic: No significant vascular findings are present. No enlarged abdominal or pelvic lymph nodes. Reproductive: Uterus and bilateral adnexa are unremarkable. Other: No abdominal wall hernia or abnormality. No abdominopelvic ascites. Musculoskeletal: No acute or significant osseous findings. IMPRESSION: No acute abnormality seen in the abdomen or pelvis. Electronically Signed   By: Marijo Conception M.D.   On: 11/23/2019 09:07    Procedures Procedures (including critical care time)  Medications Ordered in ED Medications  haloperidol lactate (HALDOL) injection 5 mg (5 mg Intravenous Given 11/23/19 0729)  diphenhydrAMINE (BENADRYL) injection 25 mg (25 mg Intravenous Given 11/23/19 0730)  sodium chloride 0.9 % bolus 1,000 mL (0 mLs Intravenous Stopped 11/23/19 1237)  promethazine (PHENERGAN) injection 25 mg (25 mg Intravenous Given 11/23/19 0902)  sodium chloride 0.9 % bolus 500 mL (500 mLs Intravenous New  Bag/Given 11/23/19 1125)  fentaNYL (SUBLIMAZE) injection 50 mcg (50 mcg Intravenous Given 11/23/19 1127)  ondansetron (ZOFRAN) injection 4 mg (4 mg Intravenous Given 11/23/19 1126)    ED Course  I have reviewed the triage vital signs and the nursing notes.  Pertinent labs & imaging results that were available during my care of the patient were reviewed by me and considered in my medical decision making (see chart for details).    MDM Rules/Calculators/A&P                          [name] is a 29 year old female with history of diabetes, gastroparesis who presents to the ED with nausea, vomiting, abdominal pain.  History of the same.  Normal vitals.  No fever.  Has not  been able to tolerate p.o. despite Phenergan suppositories at home.  Diffuse tenderness on exam of her abdomen.  Overall she is uncomfortable.  Suspect gastroparesis versus hyperemesis from marijuana use but could be intra-abdominal process and will get a CT scan and labs.  Patient given normal saline bolus, IV Haldol, IV Benadryl.  Lab work overall shows mildly elevated creatinine at 1.27.  Blood sugar is elevated to 256 but bicarb and anion gap are normal.  Patient is not in DKA.  Drug screen is positive for benzos and marijuana.  Urinalysis overall unremarkable.  CT scan showed no bowel obstruction or other acute intra-abdominal process.  Patient feeling slightly better but will give another round of antiemetics with fentanyl, Compazine.  Will give additional IV fluids.  Will p.o. challenge as patient is afraid to go home because she feels like she is in a keep throwing up.  Patient reevaluated and given Zofran.  Will attempt p.o. challenge at this time.  Patient able to tolerate p.o.  Would like discharged home.  Discharged in good condition.  Given prescription for Zofran.  Understands return precautions.  This chart was dictated using voice recognition software.  Despite best efforts to proofread,  errors can occur which can change the documentation meaning.    Final Clinical Impression(s) / ED Diagnoses Final diagnoses:  Vomiting without nausea, intractability of vomiting not specified, unspecified vomiting type    Rx / DC Orders ED Discharge Orders         Ordered    ondansetron (ZOFRAN) 4 MG tablet  Every 8 hours PRN        11/23/19 1310           Rexanne Inocencio, DO 11/23/19 1310

## 2019-11-23 NOTE — ED Notes (Signed)
Patient is tolerating PO intake (ginger ale) w/ no complaints or difficulties.

## 2019-11-24 ENCOUNTER — Telehealth: Payer: Self-pay | Admitting: Gastroenterology

## 2019-11-24 MED ORDER — SUCRALFATE 1 GM/10ML PO SUSP: 1.0000 g | Freq: Four times a day (QID) | ORAL | 1 refills | Status: DC | PRN

## 2019-11-24 NOTE — Addendum Note (Signed)
Addended by: Roetta Sessions on: 11/24/2019 06:02 PM   Modules accepted: Orders

## 2019-11-24 NOTE — Telephone Encounter (Signed)
Script sent with 1 refill

## 2019-11-24 NOTE — Telephone Encounter (Signed)
I don't see that you have prescribed Carafate for this pt. She was just in the hospital yesterday for vomiting and is asking [place] for refill on Carafate which was last sent in March by another provider. You saw her last in 07-2019. Would you like me to refer her back to her PCP?  Thanks

## 2019-11-24 NOTE — Telephone Encounter (Signed)
We can refill that for her. Thanks

## 2019-11-25 ENCOUNTER — Other Ambulatory Visit: Payer: Self-pay

## 2019-11-25 ENCOUNTER — Emergency Department (HOSPITAL_COMMUNITY)
Admission: EM | Admit: 2019-11-25 | Discharge: 2019-11-25 | Disposition: A | Discharge status: Home / Self Care | Payer: 59 | Attending: Emergency Medicine | Admitting: Emergency Medicine

## 2019-11-25 ENCOUNTER — Encounter (HOSPITAL_COMMUNITY): Payer: Self-pay | Admitting: Emergency Medicine

## 2019-11-25 ENCOUNTER — Telehealth: Payer: Self-pay | Admitting: Gastroenterology

## 2019-11-25 ENCOUNTER — Encounter (HOSPITAL_COMMUNITY): Payer: Self-pay | Admitting: *Deleted

## 2019-11-25 ENCOUNTER — Emergency Department (HOSPITAL_COMMUNITY)
Admission: EM | Admit: 2019-11-25 | Discharge: 2019-11-25 | Disposition: A | Discharge status: Home / Self Care | Payer: 59 | Source: Home / Self Care | Attending: Emergency Medicine | Admitting: Emergency Medicine

## 2019-11-25 DIAGNOSIS — R2242 Localized swelling, mass and lump, left lower limb: Secondary | ICD-10-CM | POA: Insufficient documentation

## 2019-11-25 DIAGNOSIS — R000 Tachycardia, unspecified: Secondary | ICD-10-CM | POA: Insufficient documentation

## 2019-11-25 DIAGNOSIS — E119 Type 2 diabetes mellitus without complications: Secondary | ICD-10-CM | POA: Insufficient documentation

## 2019-11-25 DIAGNOSIS — Z794 Long term (current) use of insulin: Secondary | ICD-10-CM | POA: Insufficient documentation

## 2019-11-25 DIAGNOSIS — R112 Nausea with vomiting, unspecified: Secondary | ICD-10-CM | POA: Insufficient documentation

## 2019-11-25 DIAGNOSIS — Z79899 Other long term (current) drug therapy: Secondary | ICD-10-CM | POA: Insufficient documentation

## 2019-11-25 DIAGNOSIS — R109 Unspecified abdominal pain: Secondary | ICD-10-CM | POA: Insufficient documentation

## 2019-11-25 DIAGNOSIS — I10 Essential (primary) hypertension: Secondary | ICD-10-CM

## 2019-11-25 DIAGNOSIS — R7989 Other specified abnormal findings of blood chemistry: Secondary | ICD-10-CM

## 2019-11-25 DIAGNOSIS — E860 Dehydration: Secondary | ICD-10-CM | POA: Insufficient documentation

## 2019-11-25 DIAGNOSIS — Z5321 Procedure and treatment not carried out due to patient leaving prior to being seen by health care provider: Secondary | ICD-10-CM | POA: Diagnosis not present

## 2019-11-25 LAB — URINALYSIS, ROUTINE W REFLEX MICROSCOPIC
Bacteria, UA: NONE SEEN
Bilirubin Urine: NEGATIVE
Bilirubin Urine: NEGATIVE
Glucose, UA: 50 mg/dL — AB
Glucose, UA: >=500 mg/dL — AB
Ketones, ur: 5 mg/dL — AB
Ketones, ur: 20 mg/dL — AB
Leukocytes,Ua: NEGATIVE
Leukocytes,Ua: NEGATIVE
Nitrite: NEGATIVE
Nitrite: NEGATIVE
Protein, ur: 100 mg/dL — AB
Protein, ur: >=300 mg/dL — AB
RBC / HPF: >50 RBC/hpf — ABNORMAL HIGH (ref 0–5)
RBC / HPF: >50 RBC/hpf — ABNORMAL HIGH (ref 0–5)
Specific Gravity, Urine: 1.016 (ref 1.005–1.030)
Specific Gravity, Urine: 1.021 (ref 1.005–1.030)
pH: 6 (ref 5.0–8.0)
pH: 5 (ref 5.0–8.0)

## 2019-11-25 LAB — CBC WITH DIFFERENTIAL/PLATELET
Abs Immature Granulocytes: 0.02 10*3/uL (ref 0.00–0.07)
Basophils Absolute: 0 10*3/uL (ref 0.0–0.1)
Basophils Relative: 1 %
Eosinophils Absolute: 0 10*3/uL (ref 0.0–0.5)
Eosinophils Relative: 0 %
HCT: 23.8 % — ABNORMAL LOW (ref 36.0–46.0)
Hemoglobin: 7.4 g/dL — ABNORMAL LOW (ref 12.0–15.0)
Immature Granulocytes: 1 %
Lymphocytes Relative: 26 %
Lymphs Abs: 1 10*3/uL (ref 0.7–4.0)
MCH: 26.8 pg (ref 26.0–34.0)
MCHC: 31.1 g/dL (ref 30.0–36.0)
MCV: 86.2 fL (ref 80.0–100.0)
Monocytes Absolute: 0.3 10*3/uL (ref 0.1–1.0)
Monocytes Relative: 8 %
Neutro Abs: 2.6 10*3/uL (ref 1.7–7.7)
Neutrophils Relative %: 64 %
Platelets: 213 10*3/uL (ref 150–400)
RBC: 2.76 MIL/uL — ABNORMAL LOW (ref 3.87–5.11)
RDW: 13.1 % (ref 11.5–15.5)
WBC: 3.9 10*3/uL — ABNORMAL LOW (ref 4.0–10.5)
nRBC: 0 % (ref 0.0–0.2)

## 2019-11-25 LAB — COMPREHENSIVE METABOLIC PANEL
ALT: 15 U/L (ref 0–44)
ALT: 14 U/L (ref 0–44)
AST: 19 U/L (ref 15–41)
AST: 20 U/L (ref 15–41)
Albumin: 2.9 g/dL — ABNORMAL LOW (ref 3.5–5.0)
Albumin: 3.2 g/dL — ABNORMAL LOW (ref 3.5–5.0)
Alkaline Phosphatase: 142 U/L — ABNORMAL HIGH (ref 38–126)
Alkaline Phosphatase: 145 U/L — ABNORMAL HIGH (ref 38–126)
Anion gap: 10 (ref 5–15)
Anion gap: 16 — ABNORMAL HIGH (ref 5–15)
BUN: 26 mg/dL — ABNORMAL HIGH (ref 6–20)
BUN: 33 mg/dL — ABNORMAL HIGH (ref 6–20)
CO2: 23 mmol/L (ref 22–32)
CO2: 24 mmol/L (ref 22–32)
Calcium: 8.7 mg/dL — ABNORMAL LOW (ref 8.9–10.3)
Calcium: 8.9 mg/dL (ref 8.9–10.3)
Chloride: 101 mmol/L (ref 98–111)
Chloride: 96 mmol/L — ABNORMAL LOW (ref 98–111)
Creatinine, Ser: 0.97 mg/dL (ref 0.44–1.00)
Creatinine, Ser: 1.42 mg/dL — ABNORMAL HIGH (ref 0.44–1.00)
GFR calc Af Amer: >60 mL/min (ref >60)
GFR calc Af Amer: 57 mL/min — ABNORMAL LOW (ref >60)
GFR calc non Af Amer: >60 mL/min (ref >60)
GFR calc non Af Amer: 49 mL/min — ABNORMAL LOW (ref >60)
Glucose, Bld: 232 mg/dL — ABNORMAL HIGH (ref 70–99)
Glucose, Bld: 266 mg/dL — ABNORMAL HIGH (ref 70–99)
Potassium: 4.3 mmol/L (ref 3.5–5.1)
Potassium: 3.5 mmol/L (ref 3.5–5.1)
Sodium: 134 mmol/L — ABNORMAL LOW (ref 135–145)
Sodium: 136 mmol/L (ref 135–145)
Total Bilirubin: 0.6 mg/dL (ref 0.3–1.2)
Total Bilirubin: 0.7 mg/dL (ref 0.3–1.2)
Total Protein: 8.2 g/dL — ABNORMAL HIGH (ref 6.5–8.1)
Total Protein: 8.6 g/dL — ABNORMAL HIGH (ref 6.5–8.1)

## 2019-11-25 LAB — I-STAT BETA HCG BLOOD, ED (MC, WL, AP ONLY)
I-stat hCG, quantitative: <5 m[IU]/mL (ref <5)
I-stat hCG, quantitative: <5 m[IU]/mL (ref <5)

## 2019-11-25 LAB — LIPASE, BLOOD
Lipase: 18 U/L (ref 11–51)
Lipase: 21 U/L (ref 11–51)

## 2019-11-25 LAB — CBC
HCT: 28.6 % — ABNORMAL LOW (ref 36.0–46.0)
Hemoglobin: 8.9 g/dL — ABNORMAL LOW (ref 12.0–15.0)
MCH: 26.8 pg (ref 26.0–34.0)
MCHC: 31.1 g/dL (ref 30.0–36.0)
MCV: 86.1 fL (ref 80.0–100.0)
Platelets: 250 10*3/uL (ref 150–400)
RBC: 3.32 MIL/uL — ABNORMAL LOW (ref 3.87–5.11)
RDW: 12.9 % (ref 11.5–15.5)
WBC: 4.7 10*3/uL (ref 4.0–10.5)
nRBC: 0 % (ref 0.0–0.2)

## 2019-11-25 MED ORDER — PROMETHAZINE HCL 12.5 MG PO TABS: ORAL_TABLET | ORAL | 1 refills | Status: DC

## 2019-11-25 MED ORDER — SODIUM CHLORIDE 0.9 % IV BOLUS
1000.0000 mL | Freq: Once | INTRAVENOUS | Status: AC
  Administered 2019-11-25: 1000 mL via INTRAVENOUS

## 2019-11-25 MED ORDER — DICYCLOMINE HCL 10 MG PO CAPS
20.0000 mg | ORAL_CAPSULE | Freq: Once | ORAL | Status: AC
  Administered 2019-11-25: 20 mg via ORAL
  Filled 2019-11-25: qty 2

## 2019-11-25 MED ORDER — HALOPERIDOL LACTATE 5 MG/ML IJ SOLN
5.0000 mg | Freq: Once | INTRAMUSCULAR | Status: AC
  Administered 2019-11-25: 5 mg via INTRAVENOUS
  Filled 2019-11-25: qty 1

## 2019-11-25 MED ORDER — LORAZEPAM 2 MG/ML IJ SOLN
1.0000 mg | Freq: Once | INTRAMUSCULAR | Status: AC
  Administered 2019-11-25: 1 mg via INTRAVENOUS
  Filled 2019-11-25: qty 1

## 2019-11-25 MED ORDER — ALUM & MAG HYDROXIDE-SIMETH 200-200-20 MG/5ML PO SUSP: 30.0000 mL | Freq: Once | ORAL | Status: DC

## 2019-11-25 MED ORDER — PANTOPRAZOLE SODIUM 40 MG IV SOLR
40.0000 mg | Freq: Once | INTRAVENOUS | Status: AC
  Administered 2019-11-25: 40 mg via INTRAVENOUS
  Filled 2019-11-25: qty 40

## 2019-11-25 MED ORDER — FERROUS SULFATE 325 (65 FE) MG PO TABS: 325.0000 mg | ORAL_TABLET | Freq: Every day | ORAL | 0 refills | Status: DC

## 2019-11-25 MED ORDER — DICYCLOMINE HCL 20 MG PO TABS: 20.0000 mg | ORAL_TABLET | Freq: Three times a day (TID) | ORAL | 0 refills | Status: AC | PRN

## 2019-11-25 NOTE — Telephone Encounter (Signed)
Pt states her Phonergan was denied by her pharmacy, pt would like to know what is going on with this medication

## 2019-11-25 NOTE — ED Provider Notes (Addendum)
[facility] DEPT Provider Note   CSN: [id number] Arrival date & time: 11/25/19  0086     History Chief Complaint  Patient presents with  . Abdominal Pain    Kathryn HENANDEZ is a 29 y.o. female with a history of T1DM, gastroparesis, fibromyalgia, hypertension, GERD, anemia, cannabinoid hyperemesis syndrome, and prior cholecystectomy who presents to the emergency department with complaints of nausea and vomiting as well as abdominal pain that began yesterday.  Patient states that since 4 AM yesterday she has had 7 episodes of emesis, following arrival to the emergency department she has had one during her wait here.  She has associated upper abdominal/back pain that feels like a tightness that is constant.  She started her menstrual cycle & has been having some diarrhea as well.  Overall her symptoms feel very similar to prior ER visits, no acute change in her pain.  She denies fever, chills, hematemesis, melena, dysuria, or syncope.  Denies recent travel or antibiotics.   HPI     Past Medical History:  Diagnosis Date  . Allergy   . Anemia   . Anxiety   . Blood transfusion without reported diagnosis    Dec 2018  . Cataract    right eye  . COVID-19   . Depression   . Diabetes type 1, uncontrolled ([place]) 11/14/2011   Since age 10  . Fibromyalgia   . Gastroparesis   . GERD (gastroesophageal reflux disease)   . Hypertension   . Infection    UTI April 2016    Patient Active Problem List   Diagnosis Date Noted  . Hypertensive urgency 04/30/2019  . Cannabinoid hyperemesis syndrome 04/30/2019  . Contraception management 08/06/2018  . Cyclical vomiting syndrome 06/07/2018  . Diabetic gastroparesis associated with type 1 diabetes mellitus ([place]) 06/04/2018  . Anxiety 05/31/2018  . Peripheral edema 05/31/2018  . Normocytic anemia 07/10/2017  . GERD (gastroesophageal reflux disease) 06/23/2017  . Intractable nausea and vomiting 04/11/2017  . MDD  (major depressive disorder), recurrent episode, mild ([place]) 02/28/2017  . Diabetes mellitus type 1 ([place]) 04/28/2013  . Protein-calorie malnutrition, severe ([place]) 04/28/2013  . Hypertension associated with diabetes ([place]) 11/14/2011  . Chronic abdominal pain 09/18/2011    Past Surgical History:  Procedure Laterality Date  . ANKLE SURGERY    . CHOLECYSTECTOMY  11/15/2011   Procedure: LAPAROSCOPIC CHOLECYSTECTOMY WITH INTRAOPERATIVE CHOLANGIOGRAM;  Surgeon: [name], MD;  Location: [facility];  Service: General;  Laterality: N/A;  . COLONOSCOPY    . COLONOSCOPY WITH PROPOFOL N/A 06/27/2017   Procedure: COLONOSCOPY WITH PROPOFOL;  Surgeon: Milus Banister, MD;  Location: [facility];  Service: Endoscopy;  Laterality: N/A;  . ESOPHAGOGASTRODUODENOSCOPY  12/03/2011   Procedure: ESOPHAGOGASTRODUODENOSCOPY (EGD);  Surgeon: Beryle Beams, MD;  Location: [facility];  Service: Endoscopy;  Laterality: N/A;  . FLEXIBLE SIGMOIDOSCOPY N/A 03/10/2017   Procedure: FLEXIBLE SIGMOIDOSCOPY;  Surgeon: Carol Ada, MD;  Location: [facility];  Service: Endoscopy;  Laterality: N/A;  . INCISION AND DRAINAGE PERIRECTAL ABSCESS N/A 03/01/2017   Procedure: IRRIGATION AND DEBRIDEMENT PERIRECTAL ABSCESS;  Surgeon: Alphonsa Overall, MD;  Location: [facility];  Service: General;  Laterality: N/A;  . IRRIGATION AND DEBRIDEMENT BUTTOCKS N/A 03/23/2017   Procedure: IRRIGATION AND DEBRIDEMENT BUTTOCKS, SETON PLACEMENT;  Surgeon: [name], MD;  Location: [facility];  Service: General;  Laterality: N/A;  . LAPAROSCOPY  11/23/2011   Procedure: LAPAROSCOPY DIAGNOSTIC;  Surgeon: Edward Jolly, MD;  Location: [facility];  Service: General;  Laterality:  N/A;  . SIGMOIDOSCOPY    . UPPER GASTROINTESTINAL ENDOSCOPY    . WISDOM TOOTH EXTRACTION       OB History    Gravida  1   Para  1   Term      Preterm  1   AB      Living  1     SAB      TAB      Ectopic      Multiple  0   Live Births  1            Family History  Problem Relation Age of Onset  . Diabetes Mother   . Hypertension Father   . Colon cancer Paternal Grandmother        pt thinks PGM was dx in her 82's  . Diabetes Paternal Grandmother   . Diabetes Maternal Grandmother   . Diabetes Maternal Grandfather   . Diabetes Paternal Grandfather   . Diabetes Other   . Esophageal cancer Neg Hx   . Liver cancer Neg Hx   . Pancreatic cancer Neg Hx   . Stomach cancer Neg Hx   . Rectal cancer Neg Hx     Social History   Tobacco Use  . Smoking status: Never Smoker  . Smokeless tobacco: Never Used  Vaping Use  . Vaping Use: Never used  Substance Use Topics  . Alcohol use: No    Alcohol/week: 0.0 standard drinks  . Drug use: Yes    Types: Marijuana    Home Medications Prior to Admission medications   Medication Sig Start Date End Date Taking? Authorizing Provider  aluminum-magnesium hydroxide-simethicone (MAALOX) [phone] MG/5ML SUSP Take 30 mLs by mouth 3 (three) times daily as needed (indigestion).    [provider]  Continuous Blood Gluc Sensor (DEXCOM G6 SENSOR) MISC 1 each by Does not apply route See admin instructions. every 10 days. 12/31/18   Renato Shin, MD  cyclobenzaprine (FLEXERIL) 10 MG tablet TAKE 1 TABLET(10 MG) BY MOUTH THREE TIMES DAILY AS NEEDED FOR MUSCLE SPASMS Patient taking differently: Take 10 mg by mouth 3 (three) times daily as needed for muscle spasms.  10/07/19   [name], MD  dicyclomine (BENTYL) 20 MG tablet Take 1 tablet (20 mg total) by mouth 3 (three) times daily as needed for spasms. 11/09/19   [name], MD  hydrALAZINE (APRESOLINE) 50 MG tablet Take 1 tablet (50 mg total) by mouth every 6 (six) hours. Patient taking differently: Take 50 mg by mouth 2 (two) times daily.  10/23/18   Swayze, Ava, DO  Insulin Glargine (LANTUS SOLOSTAR) 100 UNIT/ML Solostar Pen Inject 10 Units into the skin at bedtime. And pen needles 4/day 12/31/18   Renato Shin, MD  insulin lispro  (HUMALOG KWIKPEN) 100 UNIT/ML KwikPen Inject 0.06 mLs (6 Units total) into the skin 3 (three) times daily. Patient taking differently: Inject 1-9 Units into the skin 3 (three) times daily. Per sliding scale 12/31/18   Renato Shin, MD  loperamide (IMODIUM) 2 MG capsule Take 6 mg by mouth daily as needed for diarrhea or loose stools.     [provider]  LORazepam (ATIVAN) 0.5 MG tablet Take 1 tablet (0.5 mg total) by mouth 2 (two) times daily as needed for anxiety. 06/30/19   [name], MD  metoCLOPramide (REGLAN) 5 MG tablet Take 1 tablet (5 mg total) by mouth in the morning and at bedtime. 10/04/19   Armbruster, Carlota Raspberry, MD  metoprolol tartrate (  LOPRESSOR) 25 MG tablet Take 1 tablet (25 mg total) by mouth 2 (two) times daily. 05/22/19 11/23/19  Arrien, Jimmy Picket, MD  mirtazapine (REMERON) 30 MG tablet TAKE 1 TABLET BY MOUTH AT  BEDTIME Patient taking differently: Take 30 mg by mouth at bedtime.  04/19/19   [name], MD  ondansetron (ZOFRAN) 4 MG tablet Take 1 tablet (4 mg total) by mouth every 8 (eight) hours as needed for up to 20 doses for nausea or vomiting. 11/23/19   Curatolo, Adam, DO  pantoprazole (PROTONIX) 40 MG tablet Take 1 tablet (40 mg total) by mouth daily. 09/16/19   Armbruster, Carlota Raspberry, MD  promethazine (PHENERGAN) 12.5 MG tablet TAKE 1 TABLET(12.5 MG) BY MOUTH EVERY 8 HOURS AS NEEDED FOR NAUSEA OR VOMITING Patient taking differently: Take 12.5 mg by mouth every 8 (eight) hours as needed for nausea or vomiting.  10/07/19   Armbruster, Carlota Raspberry, MD  sucralfate (CARAFATE) 1 GM/10ML suspension Take 10 mLs (1 g total) by mouth every 6 (six) hours as needed. 11/24/19   Armbruster, Carlota Raspberry, MD  potassium chloride SA (KLOR-CON) 20 MEQ tablet Take 1 tablet (20 mEq total) by mouth daily for 5 doses. 07/04/19 07/30/19  Janeece Fitting, PA-C    Allergies    Other and Lactose intolerance (gi)  Review of Systems   Review of Systems  Constitutional: Negative for chills and fever.   Respiratory: Negative for shortness of breath.   Cardiovascular: Negative for chest pain and leg swelling.  Gastrointestinal: Positive for abdominal pain, diarrhea, nausea and vomiting. Negative for blood in stool.  Genitourinary: Positive for vaginal bleeding (menses). Negative for dysuria.  Musculoskeletal: Positive for back pain.  Neurological: Negative for syncope, weakness (Focal) and numbness.  All other systems reviewed and are negative.  Physical Exam Updated Vital Signs BP (!) 176/120 (BP Location: Right Arm)   Pulse (S) (!) 120 Comment: patient unable to stay still  Temp 98.7 F (37.1 C) (Oral)   Resp 20   SpO2 100%   Physical Exam Vitals and nursing note reviewed.  Constitutional:      Appearance: She is well-developed. She is not toxic-appearing.     Comments: Patient is tearful and pacing throughout exam room.  HENT:     Head: Normocephalic and atraumatic.     Mouth/Throat:     Comments: mucous membranes are dry. Eyes:     General:        Right eye: No discharge.        Left eye: No discharge.     Conjunctiva/sclera: Conjunctivae normal.  Cardiovascular:     Rate and Rhythm: Regular rhythm. Tachycardia present.  Pulmonary:     Effort: Pulmonary effort is normal. No respiratory distress.     Breath sounds: Normal breath sounds. No wheezing, rhonchi or rales.  Abdominal:     General: There is no distension.     Palpations: Abdomen is soft.     Tenderness: There is no abdominal tenderness. There is no right CVA tenderness, left CVA tenderness, guarding or rebound. Negative signs include McBurney's sign.  Musculoskeletal:     Cervical back: Neck supple.  Skin:    General: Skin is warm and dry.     Findings: No rash.  Neurological:     Mental Status: She is alert.     Comments: Clear speech.   Psychiatric:        Behavior: Behavior normal.     ED Results / Procedures / Treatments  Labs (all labs ordered are listed, but only abnormal results are  displayed) Labs Reviewed  COMPREHENSIVE METABOLIC PANEL - Abnormal; Notable for the following components:      Result Value   Chloride 96 (*)    Glucose, Bld 266 (*)    BUN 33 (*)    Creatinine, Ser 1.42 (*)    Total Protein 8.6 (*)    Albumin 3.2 (*)    Alkaline Phosphatase 145 (*)    GFR calc non Af Amer 49 (*)    GFR calc Af Amer 57 (*)    Anion gap 16 (*)    All other components within normal limits  URINALYSIS, ROUTINE W REFLEX MICROSCOPIC - Abnormal; Notable for the following components:   APPearance HAZY (*)    Glucose, UA >=500 (*)    Hgb urine dipstick MODERATE (*)    Ketones, ur 20 (*)    Protein, ur >=300 (*)    RBC / HPF >50 (*)    Bacteria, UA RARE (*)    All other components within normal limits  CBC WITH DIFFERENTIAL/PLATELET - Abnormal; Notable for the following components:   WBC 3.9 (*)    RBC 2.76 (*)    Hemoglobin 7.4 (*)    HCT 23.8 (*)    All other components within normal limits  LIPASE, BLOOD  CBC WITH DIFFERENTIAL/PLATELET  I-STAT BETA HCG BLOOD, ED ([facility], [facility], [facility] ONLY)  CBG MONITORING, ED    EKG EKG Interpretation  Date/Time:  Friday November 25 2019 14:19:17 EDT Ventricular Rate:  121 PR Interval:    QRS Duration: 83 QT Interval:  328 QTC Calculation: 466 R Axis:   74 Text Interpretation: Sinus tachycardia Confirmed by Dene Gentry [phone]) on 11/25/2019 3:09:24 PM   Radiology No results found.  Procedures Procedures (including critical care time)  Medications Ordered in ED Medications  sodium chloride 0.9 % bolus 1,000 mL (1,000 mLs Intravenous New Bag/Given 11/25/19 1546)  sodium chloride 0.9 % bolus 1,000 mL (1,000 mLs Intravenous New Bag/Given 11/25/19 1633)  haloperidol lactate (HALDOL) injection 5 mg (5 mg Intravenous Given 11/25/19 1541)  pantoprazole (PROTONIX) injection 40 mg (40 mg Intravenous Given 11/25/19 1545)  LORazepam (ATIVAN) injection 1 mg (1 mg Intravenous Given 11/25/19 1631)  dicyclomine (BENTYL) capsule 20 mg (20 mg  Oral Given 11/25/19 1632)    ED Course  I have reviewed the triage vital signs and the nursing notes.  Pertinent labs & imaging results that were available during my care of the patient were reviewed by me and considered in my medical decision making (see chart for details).    MDM Rules/Calculators/A&P                         Patient presents to the ED with complaints of N/V & abdominal pain. Patient is pacing in the room, but is nontoxic, she is notably tachycardic & hypertensive- hx of similar in the past. Abdominal exam is completely benign, no focal tenderness or peritoneal signs. Plan for labs, fluids, check EKG, likely give haldol.  Additional history obtained:  Additional history obtained from nursing note & chart review. Previous records obtained and reviewed:  Patient with visit for similar 11/23/19, had CT imaging performed at that time- IMPRESSION: No acute abnormality seen in the abdomen or pelvis. EKG: QTc 466 ,sinus tachycardia.  Lab Tests:  I Ordered, reviewed, and interpreted labs, which included:  Pregnancy test: Negative CBC: Mild worsened anemia- no reports of hematemesis/melena,  menstruating so would be difficult to perform fecal occult.  CMP: Mild AKI with BUN/creatinine 33/1.42 today and most recently 26/0.97 respectively. Hyperglycemia without acidosis, minimally elevated anion gap @ 16. T bili & LFTs WNL. Lipase: WNL UA: consistent w/ menses & dehydration.   ED Course:  16:00: RE-EVAL: Patient remains up and pacing, she was encouraged to lay down she states her nausea is improved, will give 1 mg of Ativan IV and trial oral challenge with bentyl.   17:10: RE-EVAL: Patient is feeling much better, she is tolerating PO & she would like to be discharged. Her repeat abdominal exam is reassuring, no tenderness/peritoneal signs, has had similar presentation with reassuring CT scan 2 days ago, doubt acute surgical process. Her hgb is downtrending, no reports of GI bleeding,  she is menstruating making fecal occult potentially inaccurate, no heavier than normal bleeding reported, will start iron supplement PCP follow up. She does have abnormalities on her metabolic panel however she has received fluids in the ED with improvement in her HR (currently 110 as she walks through exam room, previously 120, does have tachycardia at multiple prior visits) and I anticipate the fluids have assisted with metabolic abnormalities and she does not wish to stay longer therefore will discharge at this time with supportive care & strict return precautions & good follow up. I discussed results, treatment plan, need for follow-up, and return precautions with the patient. Provided opportunity for questions, patient confirmed understanding and is in agreement with plan.   Findings and plan of care discussed with supervising physician Dr. Francia Greaves who is in agreement.   Portions of this note were generated with [employer]. Dictation errors may occur despite best attempts at proofreading.  Final Clinical Impression(s) / ED Diagnoses Final diagnoses:  Non-intractable vomiting with nausea, unspecified vomiting type  Elevated serum creatinine  Dehydration  Hypertension, unspecified type    Rx / DC Orders ED Discharge Orders         Ordered    ferrous sulfate 325 (65 FE) MG tablet  Daily        11/25/19 1751    dicyclomine (BENTYL) 20 MG tablet  Every 8 hours PRN        11/25/19 1751           Danaya Geddis, Glynda Jaeger, PA-C 11/25/19 1754    Amaryllis Dyke, PA-C 11/25/19 1756    Valarie Merino, MD 11/25/19 2251

## 2019-11-25 NOTE — ED Triage Notes (Signed)
Pt reports n/v and abdominal cramping. She is also concerned about left lower leg swelling.

## 2019-11-25 NOTE — Telephone Encounter (Signed)
Follow up scheduled for 01-20-2020.  Refills have been given

## 2019-11-25 NOTE — ED Triage Notes (Signed)
Per pt, states abdominal pain.N/V for 2 days-states she took some phenergan a few hours ago-states she has not had any of her other meds

## 2019-11-25 NOTE — Discharge Instructions (Addendum)
You were seen in the ER today for nausea, vomiting, and abdominal pain.  Your blood pressure and kidney function were elevated, your hemoglobin was a bit lower than usual, and your blood sugar was elevated. Drink plenty of fluids to stay hydrated and help your kidney function. Please follow up with your primary care provider within 3 days for a recheck of each of these.   Please continue to take your Phenergan as needed.  We are sending you with Bentyl to take every 8 hours as needed for abdominal discomfort. WE re also sending you home with an iron supplement to take once per day to help with your lower hemoglobin, if you begin to experience constipation with this medicine you can take an over the counter laxative such as miralax.   We have prescribed you new medication(s) today. Discuss the medications prescribed today with your pharmacist as they can have adverse effects and interactions with your other medicines including over the counter and prescribed medications. Seek medical evaluation if you start to experience new or abnormal symptoms after taking one of these medicines, seek care immediately if you start to experience difficulty breathing, feeling of your throat closing, facial swelling, or rash as these could be indications of a more serious allergic reaction   Please follow-up within 3 days as discussed above.  Return to the ER for new or worsening symptoms including but not limited to inability to keep fluids down, blood in your vomit or stool, increased pain, dizziness, passing out, fever, increased thirst/urination, or any other concerns.

## 2019-11-27 ENCOUNTER — Other Ambulatory Visit: Payer: Self-pay | Admitting: Family Medicine

## 2019-11-27 ENCOUNTER — Emergency Department (HOSPITAL_COMMUNITY)
Admission: EM | Admit: 2019-11-27 | Discharge: 2019-11-27 | Disposition: A | Discharge status: Home / Self Care | Payer: 59 | Attending: Emergency Medicine | Admitting: Emergency Medicine

## 2019-11-27 ENCOUNTER — Encounter (HOSPITAL_COMMUNITY): Payer: Self-pay

## 2019-11-27 ENCOUNTER — Other Ambulatory Visit: Payer: Self-pay

## 2019-11-27 DIAGNOSIS — R109 Unspecified abdominal pain: Secondary | ICD-10-CM | POA: Insufficient documentation

## 2019-11-27 DIAGNOSIS — R112 Nausea with vomiting, unspecified: Secondary | ICD-10-CM | POA: Insufficient documentation

## 2019-11-27 DIAGNOSIS — Z5321 Procedure and treatment not carried out due to patient leaving prior to being seen by health care provider: Secondary | ICD-10-CM | POA: Insufficient documentation

## 2019-11-27 LAB — COMPREHENSIVE METABOLIC PANEL
ALT: 16 U/L (ref 0–44)
AST: 19 U/L (ref 15–41)
Albumin: 2.9 g/dL — ABNORMAL LOW (ref 3.5–5.0)
Alkaline Phosphatase: 134 U/L — ABNORMAL HIGH (ref 38–126)
Anion gap: 11 (ref 5–15)
BUN: 32 mg/dL — ABNORMAL HIGH (ref 6–20)
CO2: 24 mmol/L (ref 22–32)
Calcium: 8.7 mg/dL — ABNORMAL LOW (ref 8.9–10.3)
Chloride: 102 mmol/L (ref 98–111)
Creatinine, Ser: 1.12 mg/dL — ABNORMAL HIGH (ref 0.44–1.00)
GFR calc Af Amer: >60 mL/min (ref >60)
GFR calc non Af Amer: >60 mL/min (ref >60)
Glucose, Bld: 251 mg/dL — ABNORMAL HIGH (ref 70–99)
Potassium: 3.1 mmol/L — ABNORMAL LOW (ref 3.5–5.1)
Sodium: 137 mmol/L (ref 135–145)
Total Bilirubin: 0.5 mg/dL (ref 0.3–1.2)
Total Protein: 8.1 g/dL (ref 6.5–8.1)

## 2019-11-27 LAB — I-STAT BETA HCG BLOOD, ED (MC, WL, AP ONLY): I-stat hCG, quantitative: <5 m[IU]/mL (ref <5)

## 2019-11-27 LAB — URINALYSIS, ROUTINE W REFLEX MICROSCOPIC
Bacteria, UA: NONE SEEN
Bilirubin Urine: NEGATIVE
Glucose, UA: >=500 mg/dL — AB
Ketones, ur: NEGATIVE mg/dL
Leukocytes,Ua: NEGATIVE
Nitrite: NEGATIVE
Protein, ur: >=300 mg/dL — AB
Specific Gravity, Urine: 1.014 (ref 1.005–1.030)
pH: 5 (ref 5.0–8.0)

## 2019-11-27 LAB — LIPASE, BLOOD: Lipase: 20 U/L (ref 11–51)

## 2019-11-27 LAB — CBC
HCT: 26.2 % — ABNORMAL LOW (ref 36.0–46.0)
Hemoglobin: 8.4 g/dL — ABNORMAL LOW (ref 12.0–15.0)
MCH: 27.3 pg (ref 26.0–34.0)
MCHC: 32.1 g/dL (ref 30.0–36.0)
MCV: 85.1 fL (ref 80.0–100.0)
Platelets: 280 10*3/uL (ref 150–400)
RBC: 3.08 MIL/uL — ABNORMAL LOW (ref 3.87–5.11)
RDW: 13 % (ref 11.5–15.5)
WBC: 5.1 10*3/uL (ref 4.0–10.5)
nRBC: 0 % (ref 0.0–0.2)

## 2019-11-27 LAB — CBG MONITORING, ED: Glucose-Capillary: 238 mg/dL — ABNORMAL HIGH (ref 70–99)

## 2019-11-27 NOTE — ED Triage Notes (Signed)
Arrived POV. Patient reports nausea, vomiting, and abdominal pain X 3 days.

## 2019-11-27 NOTE — ED Provider Notes (Signed)
ECG interpretation: Sinus tachycardia 106 bpm.  Right atrial enlargement.  Normal axis.  Normal intervals.  Normal ST-T wave.  When compared with ECG of 11/25/2019, heart rate has decreased slightly.   Delora Fuel, MD 83/38/25 [phone]

## 2019-12-01 ENCOUNTER — Other Ambulatory Visit: Payer: Self-pay | Admitting: *Deleted

## 2019-12-01 NOTE — Patient Outreach (Signed)
[facility]) Care Management  08/30/2546  MYKELL GENAO [birth year] [id number]   Referral Date: 9/1 Referral Source: Insurance Referral Reason: multiple ED visits - 26 in the last 6 months Insurance: [employer] attempt #1, unsuccessful, HIPAA compliant voice message left.  Plan: RN CM will send unsuccessful outreach letter and follow up within the next 3-4 business days.  Valente David, [place], MSN [place] [phone]

## 2019-12-02 ENCOUNTER — Encounter (HOSPITAL_COMMUNITY): Payer: Self-pay | Admitting: Emergency Medicine

## 2019-12-02 ENCOUNTER — Other Ambulatory Visit: Payer: Self-pay

## 2019-12-02 ENCOUNTER — Emergency Department (HOSPITAL_COMMUNITY)
Admission: EM | Admit: 2019-12-02 | Discharge: 2019-12-02 | Disposition: A | Discharge status: Home / Self Care | Payer: 59 | Attending: Emergency Medicine | Admitting: Emergency Medicine

## 2019-12-02 DIAGNOSIS — Z79899 Other long term (current) drug therapy: Secondary | ICD-10-CM | POA: Insufficient documentation

## 2019-12-02 DIAGNOSIS — Z794 Long term (current) use of insulin: Secondary | ICD-10-CM | POA: Insufficient documentation

## 2019-12-02 DIAGNOSIS — F1290 Cannabis use, unspecified, uncomplicated: Secondary | ICD-10-CM | POA: Insufficient documentation

## 2019-12-02 DIAGNOSIS — R197 Diarrhea, unspecified: Secondary | ICD-10-CM | POA: Insufficient documentation

## 2019-12-02 DIAGNOSIS — R112 Nausea with vomiting, unspecified: Secondary | ICD-10-CM | POA: Insufficient documentation

## 2019-12-02 DIAGNOSIS — E1043 Type 1 diabetes mellitus with diabetic autonomic (poly)neuropathy: Secondary | ICD-10-CM | POA: Insufficient documentation

## 2019-12-02 DIAGNOSIS — R000 Tachycardia, unspecified: Secondary | ICD-10-CM | POA: Insufficient documentation

## 2019-12-02 DIAGNOSIS — Z8616 Personal history of COVID-19: Secondary | ICD-10-CM | POA: Insufficient documentation

## 2019-12-02 DIAGNOSIS — I10 Essential (primary) hypertension: Secondary | ICD-10-CM | POA: Insufficient documentation

## 2019-12-02 DIAGNOSIS — G8929 Other chronic pain: Secondary | ICD-10-CM | POA: Insufficient documentation

## 2019-12-02 DIAGNOSIS — R109 Unspecified abdominal pain: Secondary | ICD-10-CM | POA: Insufficient documentation

## 2019-12-02 NOTE — ED Triage Notes (Signed)
Per pt, states she is having nausea, vomiting and abdominal pain-chronic symptoms-has an appointment with GI in October

## 2019-12-03 ENCOUNTER — Emergency Department (HOSPITAL_COMMUNITY)
Admission: EM | Admit: 2019-12-03 | Discharge: 2019-12-03 | Disposition: A | Discharge status: Home / Self Care | Payer: 59 | Source: Home / Self Care | Attending: Emergency Medicine | Admitting: Emergency Medicine

## 2019-12-03 ENCOUNTER — Encounter (HOSPITAL_COMMUNITY): Payer: Self-pay

## 2019-12-03 DIAGNOSIS — F1290 Cannabis use, unspecified, uncomplicated: Secondary | ICD-10-CM

## 2019-12-03 DIAGNOSIS — K3184 Gastroparesis: Secondary | ICD-10-CM

## 2019-12-03 DIAGNOSIS — R112 Nausea with vomiting, unspecified: Secondary | ICD-10-CM

## 2019-12-03 DIAGNOSIS — G8929 Other chronic pain: Secondary | ICD-10-CM

## 2019-12-03 DIAGNOSIS — E1043 Type 1 diabetes mellitus with diabetic autonomic (poly)neuropathy: Secondary | ICD-10-CM

## 2019-12-03 DIAGNOSIS — R109 Unspecified abdominal pain: Secondary | ICD-10-CM

## 2019-12-03 LAB — CBC WITH DIFFERENTIAL/PLATELET
Abs Immature Granulocytes: 0.03 10*3/uL (ref 0.00–0.07)
Basophils Absolute: 0 10*3/uL (ref 0.0–0.1)
Basophils Relative: 0 %
Eosinophils Absolute: 0 10*3/uL (ref 0.0–0.5)
Eosinophils Relative: 0 %
HCT: 29.1 % — ABNORMAL LOW (ref 36.0–46.0)
Hemoglobin: 9.2 g/dL — ABNORMAL LOW (ref 12.0–15.0)
Immature Granulocytes: 1 %
Lymphocytes Relative: 14 %
Lymphs Abs: 0.9 10*3/uL (ref 0.7–4.0)
MCH: 26.7 pg (ref 26.0–34.0)
MCHC: 31.6 g/dL (ref 30.0–36.0)
MCV: 84.3 fL (ref 80.0–100.0)
Monocytes Absolute: 0.2 10*3/uL (ref 0.1–1.0)
Monocytes Relative: 3 %
Neutro Abs: 5 10*3/uL (ref 1.7–7.7)
Neutrophils Relative %: 82 %
Platelets: 336 10*3/uL (ref 150–400)
RBC: 3.45 MIL/uL — ABNORMAL LOW (ref 3.87–5.11)
RDW: 13.4 % (ref 11.5–15.5)
WBC: 6.2 10*3/uL (ref 4.0–10.5)
nRBC: 0 % (ref 0.0–0.2)

## 2019-12-03 LAB — COMPREHENSIVE METABOLIC PANEL
ALT: 40 U/L (ref 0–44)
AST: 33 U/L (ref 15–41)
Albumin: 3.4 g/dL — ABNORMAL LOW (ref 3.5–5.0)
Alkaline Phosphatase: 161 U/L — ABNORMAL HIGH (ref 38–126)
Anion gap: 20 — ABNORMAL HIGH (ref 5–15)
BUN: 41 mg/dL — ABNORMAL HIGH (ref 6–20)
CO2: 24 mmol/L (ref 22–32)
Calcium: 9.8 mg/dL (ref 8.9–10.3)
Chloride: 96 mmol/L — ABNORMAL LOW (ref 98–111)
Creatinine, Ser: 1.38 mg/dL — ABNORMAL HIGH (ref 0.44–1.00)
GFR calc Af Amer: 59 mL/min — ABNORMAL LOW (ref >60)
GFR calc non Af Amer: 51 mL/min — ABNORMAL LOW (ref >60)
Glucose, Bld: 457 mg/dL — ABNORMAL HIGH (ref 70–99)
Potassium: 4.6 mmol/L (ref 3.5–5.1)
Sodium: 140 mmol/L (ref 135–145)
Total Bilirubin: 0.8 mg/dL (ref 0.3–1.2)
Total Protein: 8.8 g/dL — ABNORMAL HIGH (ref 6.5–8.1)

## 2019-12-03 LAB — URINALYSIS, ROUTINE W REFLEX MICROSCOPIC
Bacteria, UA: NONE SEEN
Bilirubin Urine: NEGATIVE
Glucose, UA: >=500 mg/dL — AB
Ketones, ur: 5 mg/dL — AB
Leukocytes,Ua: NEGATIVE
Nitrite: NEGATIVE
Protein, ur: >=300 mg/dL — AB
RBC / HPF: >50 RBC/hpf — ABNORMAL HIGH (ref 0–5)
Specific Gravity, Urine: 1.022 (ref 1.005–1.030)
pH: 6 (ref 5.0–8.0)

## 2019-12-03 LAB — BLOOD GAS, VENOUS
Acid-Base Excess: 0.2 mmol/L (ref 0.0–2.0)
Bicarbonate: 25 mmol/L (ref 20.0–28.0)
FIO2: 21
O2 Saturation: 27.4 %
Patient temperature: 98.6
pCO2, Ven: 44.1 mmHg (ref 44.0–60.0)
pH, Ven: 7.372 (ref 7.250–7.430)
pO2, Ven: <31 mmHg — CL (ref 32.0–45.0)

## 2019-12-03 LAB — RAPID URINE DRUG SCREEN, HOSP PERFORMED
Amphetamines: NOT DETECTED
Barbiturates: NOT DETECTED
Benzodiazepines: NOT DETECTED
Cocaine: NOT DETECTED
Opiates: NOT DETECTED
Tetrahydrocannabinol: POSITIVE — AB

## 2019-12-03 LAB — LACTIC ACID, PLASMA: Lactic Acid, Venous: 3 mmol/L (ref 0.5–1.9)

## 2019-12-03 LAB — CBG MONITORING, ED: Glucose-Capillary: 350 mg/dL — ABNORMAL HIGH (ref 70–99)

## 2019-12-03 LAB — I-STAT BETA HCG BLOOD, ED (MC, WL, AP ONLY): I-stat hCG, quantitative: <5 m[IU]/mL (ref <5)

## 2019-12-03 LAB — ETHANOL: Alcohol, Ethyl (B): <10 mg/dL (ref <10)

## 2019-12-03 LAB — LIPASE, BLOOD: Lipase: 22 U/L (ref 11–51)

## 2019-12-03 MED ORDER — HALOPERIDOL LACTATE 5 MG/ML IJ SOLN
5.0000 mg | Freq: Once | INTRAMUSCULAR | Status: AC
  Administered 2019-12-03: 5 mg via INTRAVENOUS
  Filled 2019-12-03: qty 1

## 2019-12-03 MED ORDER — FAMOTIDINE IN NACL 20-0.9 MG/50ML-% IV SOLN
20.0000 mg | Freq: Once | INTRAVENOUS | Status: AC
  Administered 2019-12-03: 20 mg via INTRAVENOUS
  Filled 2019-12-03: qty 50

## 2019-12-03 MED ORDER — FENTANYL CITRATE (PF) 100 MCG/2ML IJ SOLN
50.0000 ug | Freq: Once | INTRAMUSCULAR | Status: AC
  Administered 2019-12-03: 50 ug via INTRAVENOUS
  Filled 2019-12-03: qty 2

## 2019-12-03 MED ORDER — SODIUM CHLORIDE 0.9 % IV BOLUS
1000.0000 mL | Freq: Once | INTRAVENOUS | Status: AC
  Administered 2019-12-03: 1000 mL via INTRAVENOUS

## 2019-12-03 MED ORDER — INSULIN ASPART 100 UNIT/ML ~~LOC~~ SOLN
5.0000 [IU] | Freq: Once | SUBCUTANEOUS | Status: AC
  Administered 2019-12-03: 5 [IU] via SUBCUTANEOUS
  Filled 2019-12-03: qty 0.05

## 2019-12-03 MED ORDER — SODIUM CHLORIDE 0.9 % IV BOLUS (SEPSIS)
1000.0000 mL | Freq: Once | INTRAVENOUS | Status: AC
  Administered 2019-12-03: 1000 mL via INTRAVENOUS

## 2019-12-03 MED ORDER — SODIUM CHLORIDE 0.9 % IV SOLN
1000.0000 mL | INTRAVENOUS | Status: DC
  Administered 2019-12-03: 1000 mL via INTRAVENOUS

## 2019-12-03 NOTE — ED Provider Notes (Signed)
[facility] DEPT Provider Note   CSN: [id number] Arrival date & time: 12/02/19  2253     History Chief Complaint  Patient presents with  . Emesis  . Abdominal Pain    RAMONA RUARK is a 29 y.o. female.  HPI Patient has history of T1DM, gastroparesis, fibromyalgia, hypertension, GERD, anemia, cannabinoid hyperemesis syndrome, and prior cholecystectomy who presents to the emergency department with complaints of nausea and vomiting as well as abdominal pain that began yesterday.  Patient reports that she started vomiting last night and had multiple episodes.  She reports she has not taken her insulin since day before yesterday because she was not feeling good.  No fevers.  She reports she has had some associated diarrhea.  She has diffuse upper and central abdominal pain cramping and achy in nature.  No abnormal vaginal discharge or bleeding.  She reports she has seen some blood in her vomit.    Past Medical History:  Diagnosis Date  . Allergy   . Anemia   . Anxiety   . Blood transfusion without reported diagnosis    Dec 2018  . Cataract    right eye  . COVID-19   . Depression   . Diabetes type 1, uncontrolled ([place]) 11/14/2011   Since age 41  . Fibromyalgia   . Gastroparesis   . GERD (gastroesophageal reflux disease)   . Hypertension   . Infection    UTI April 2016    Patient Active Problem List   Diagnosis Date Noted  . Hypertensive urgency 04/30/2019  . Cannabinoid hyperemesis syndrome 04/30/2019  . Contraception management 08/06/2018  . Cyclical vomiting syndrome 06/07/2018  . Diabetic gastroparesis associated with type 1 diabetes mellitus ([place]) 06/04/2018  . Anxiety 05/31/2018  . Peripheral edema 05/31/2018  . Normocytic anemia 07/10/2017  . GERD (gastroesophageal reflux disease) 06/23/2017  . Intractable nausea and vomiting 04/11/2017  . MDD (major depressive disorder), recurrent episode, mild ([place]) 02/28/2017  . Diabetes mellitus  type 1 ([place]) 04/28/2013  . Protein-calorie malnutrition, severe ([place]) 04/28/2013  . Hypertension associated with diabetes ([place]) 11/14/2011  . Chronic abdominal pain 09/18/2011    Past Surgical History:  Procedure Laterality Date  . ANKLE SURGERY    . CHOLECYSTECTOMY  11/15/2011   Procedure: LAPAROSCOPIC CHOLECYSTECTOMY WITH INTRAOPERATIVE CHOLANGIOGRAM;  Surgeon: [name], MD;  Location: [facility];  Service: General;  Laterality: N/A;  . COLONOSCOPY    . COLONOSCOPY WITH PROPOFOL N/A 06/27/2017   Procedure: COLONOSCOPY WITH PROPOFOL;  Surgeon: Milus Banister, MD;  Location: [facility];  Service: Endoscopy;  Laterality: N/A;  . ESOPHAGOGASTRODUODENOSCOPY  12/03/2011   Procedure: ESOPHAGOGASTRODUODENOSCOPY (EGD);  Surgeon: Beryle Beams, MD;  Location: [facility];  Service: Endoscopy;  Laterality: N/A;  . FLEXIBLE SIGMOIDOSCOPY N/A 03/10/2017   Procedure: FLEXIBLE SIGMOIDOSCOPY;  Surgeon: Carol Ada, MD;  Location: [facility];  Service: Endoscopy;  Laterality: N/A;  . INCISION AND DRAINAGE PERIRECTAL ABSCESS N/A 03/01/2017   Procedure: IRRIGATION AND DEBRIDEMENT PERIRECTAL ABSCESS;  Surgeon: Alphonsa Overall, MD;  Location: [facility];  Service: General;  Laterality: N/A;  . IRRIGATION AND DEBRIDEMENT BUTTOCKS N/A 03/23/2017   Procedure: IRRIGATION AND DEBRIDEMENT BUTTOCKS, SETON PLACEMENT;  Surgeon: [name], MD;  Location: [facility];  Service: General;  Laterality: N/A;  . LAPAROSCOPY  11/23/2011   Procedure: LAPAROSCOPY DIAGNOSTIC;  Surgeon: Edward Jolly, MD;  Location: [facility];  Service: General;  Laterality: N/A;  . SIGMOIDOSCOPY    . UPPER GASTROINTESTINAL ENDOSCOPY    .  WISDOM TOOTH EXTRACTION       OB History    Gravida  1   Para  1   Term      Preterm  1   AB      Living  1     SAB      TAB      Ectopic      Multiple  0   Live Births  1           Family History  Problem Relation Age of Onset  . Diabetes Mother   . Hypertension Father     . Colon cancer Paternal Grandmother        pt thinks PGM was dx in her 68's  . Diabetes Paternal Grandmother   . Diabetes Maternal Grandmother   . Diabetes Maternal Grandfather   . Diabetes Paternal Grandfather   . Diabetes Other   . Esophageal cancer Neg Hx   . Liver cancer Neg Hx   . Pancreatic cancer Neg Hx   . Stomach cancer Neg Hx   . Rectal cancer Neg Hx     Social History   Tobacco Use  . Smoking status: Never Smoker  . Smokeless tobacco: Never Used  Vaping Use  . Vaping Use: Never used  Substance Use Topics  . Alcohol use: No    Alcohol/week: 0.0 standard drinks  . Drug use: Yes    Types: Marijuana    Home Medications Prior to Admission medications   Medication Sig Start Date End Date Taking? Authorizing Provider  aluminum-magnesium hydroxide-simethicone (MAALOX) 761-470-92 MG/5ML SUSP Take 30 mLs by mouth 3 (three) times daily as needed (indigestion).    [provider]  Continuous Blood Gluc Sensor (DEXCOM G6 SENSOR) MISC 1 each by Does not apply route See admin instructions. every 10 days. 12/31/18   Renato Shin, MD  cyclobenzaprine (FLEXERIL) 10 MG tablet TAKE 1 TABLET(10 MG) BY MOUTH THREE TIMES DAILY AS NEEDED FOR MUSCLE SPASMS 11/28/19   [name], MD  dicyclomine (BENTYL) 20 MG tablet Take 1 tablet (20 mg total) by mouth every 8 (eight) hours as needed for spasms. 11/25/19   Petrucelli, Samantha R, PA-C  ferrous sulfate 325 (65 FE) MG tablet Take 1 tablet (325 mg total) by mouth daily. 11/25/19   Petrucelli, Samantha R, PA-C  hydrALAZINE (APRESOLINE) 50 MG tablet Take 1 tablet (50 mg total) by mouth every 6 (six) hours. Patient taking differently: Take 50 mg by mouth 2 (two) times daily.  10/23/18   Swayze, Ava, DO  Insulin Glargine (LANTUS SOLOSTAR) 100 UNIT/ML Solostar Pen Inject 10 Units into the skin at bedtime. And pen needles 4/day 12/31/18   Renato Shin, MD  insulin lispro (HUMALOG KWIKPEN) 100 UNIT/ML KwikPen Inject 0.06 mLs (6 Units  total) into the skin 3 (three) times daily. Patient taking differently: Inject 1-9 Units into the skin 3 (three) times daily. Per sliding scale 12/31/18   Renato Shin, MD  loperamide (IMODIUM) 2 MG capsule Take 6 mg by mouth daily as needed for diarrhea or loose stools.     [provider]  LORazepam (ATIVAN) 0.5 MG tablet Take 1 tablet (0.5 mg total) by mouth 2 (two) times daily as needed for anxiety. 06/30/19   [name], MD  metoCLOPramide (REGLAN) 5 MG tablet Take 1 tablet (5 mg total) by mouth in the morning and at bedtime. 10/04/19   Armbruster, Carlota Raspberry, MD  metoprolol tartrate (LOPRESSOR) 25 MG tablet Take 1 tablet (25  mg total) by mouth 2 (two) times daily. 05/22/19 11/23/19  Arrien, Jimmy Picket, MD  mirtazapine (REMERON) 30 MG tablet TAKE 1 TABLET BY MOUTH AT  BEDTIME Patient taking differently: Take 30 mg by mouth at bedtime.  04/19/19   [name], MD  ondansetron (ZOFRAN) 4 MG tablet Take 1 tablet (4 mg total) by mouth every 8 (eight) hours as needed for up to 20 doses for nausea or vomiting. 11/23/19   Curatolo, Adam, DO  pantoprazole (PROTONIX) 40 MG tablet Take 1 tablet (40 mg total) by mouth daily. 09/16/19   Armbruster, Carlota Raspberry, MD  promethazine (PHENERGAN) 12.5 MG tablet TAKE 1 TABLET(12.5 MG) BY MOUTH EVERY 8 HOURS AS NEEDED FOR NAUSEA OR VOMITING 11/25/19   Armbruster, Carlota Raspberry, MD  sucralfate (CARAFATE) 1 GM/10ML suspension Take 10 mLs (1 g total) by mouth every 6 (six) hours as needed. 11/24/19   Armbruster, Carlota Raspberry, MD  potassium chloride SA (KLOR-CON) 20 MEQ tablet Take 1 tablet (20 mEq total) by mouth daily for 5 doses. 07/04/19 07/30/19  Janeece Fitting, PA-C    Allergies    Other and Lactose intolerance (gi)  Review of Systems   Review of Systems 10 systems reviewed and negative except as per HPI Physical Exam Updated Vital Signs BP (!) 158/88 (BP Location: Right Arm)   Pulse (!) 105   Temp 98 F (36.7 C) (Oral)   Resp 16   Ht 5' 7"  (1.702 m)   Wt 59  kg   SpO2 100%   BMI 20.36 kg/m   Physical Exam Constitutional:      Comments: Patient is alert and nontoxic.  No respiratory distress at rest.  Uncomfortable appearance.  HENT:     Head: Normocephalic and atraumatic.     Mouth/Throat:     Pharynx: Oropharynx is clear.  Cardiovascular:     Comments: Borderline tachycardia. Pulmonary:     Effort: Pulmonary effort is normal.     Breath sounds: Normal breath sounds.  Abdominal:     Comments: Abdomen is soft.  Patient endorses discomfort diffusely without any guarding.  Musculoskeletal:        General: No swelling or tenderness. Normal range of motion.     Cervical back: Neck supple.     Right lower leg: No edema.     Left lower leg: No edema.  Skin:    General: Skin is warm and dry.  Neurological:     General: No focal deficit present.     Mental Status: She is oriented to person, place, and time.     Coordination: Coordination normal.     ED Results / Procedures / Treatments   Labs (all labs ordered are listed, but only abnormal results are displayed) Labs Reviewed  COMPREHENSIVE METABOLIC PANEL - Abnormal; Notable for the following components:      Result Value   Chloride 96 (*)    Glucose, Bld 457 (*)    BUN 41 (*)    Creatinine, Ser 1.38 (*)    Total Protein 8.8 (*)    Albumin 3.4 (*)    Alkaline Phosphatase 161 (*)    GFR calc non Af Amer 51 (*)    GFR calc Af Amer 59 (*)    Anion gap 20 (*)    All other components within normal limits  LACTIC ACID, PLASMA - Abnormal; Notable for the following components:   Lactic Acid, Venous 3.0 (*)    All other components within normal limits  CBC  WITH DIFFERENTIAL/PLATELET - Abnormal; Notable for the following components:   RBC 3.45 (*)    Hemoglobin 9.2 (*)    HCT 29.1 (*)    All other components within normal limits  URINALYSIS, ROUTINE W REFLEX MICROSCOPIC - Abnormal; Notable for the following components:   Glucose, UA >=500 (*)    Hgb urine dipstick MODERATE (*)     Ketones, ur 5 (*)    Protein, ur >=300 (*)    RBC / HPF >50 (*)    All other components within normal limits  RAPID URINE DRUG SCREEN, HOSP PERFORMED - Abnormal; Notable for the following components:   Tetrahydrocannabinol POSITIVE (*)    All other components within normal limits  BLOOD GAS, VENOUS - Abnormal; Notable for the following components:   pO2, Ven <31.0 (*)    All other components within normal limits  CBG MONITORING, ED - Abnormal; Notable for the following components:   Glucose-Capillary 350 (*)    All other components within normal limits  ETHANOL  LIPASE, BLOOD  LACTIC ACID, PLASMA  I-STAT BETA HCG BLOOD, ED ([facility], [facility], [facility] ONLY)    EKG None  Radiology No results found.  Procedures Procedures (including critical care time)  Medications Ordered in ED Medications  sodium chloride 0.9 % bolus 1,000 mL (1,000 mLs Intravenous New Bag/Given 12/03/19 1145)    Followed by  0.9 %  sodium chloride infusion (has no administration in time range)  sodium chloride 0.9 % bolus 1,000 mL (1,000 mLs Intravenous New Bag/Given 12/03/19 0906)  famotidine (PEPCID) IVPB 20 mg premix (20 mg Intravenous New Bag/Given 12/03/19 0908)  haloperidol lactate (HALDOL) injection 5 mg (5 mg Intravenous Given 12/03/19 0907)  insulin aspart (novoLOG) injection 5 Units (5 Units Subcutaneous Given 12/03/19 1146)  fentaNYL (SUBLIMAZE) injection 50 mcg (50 mcg Intravenous Given 12/03/19 1146)    ED Course  I have reviewed the triage vital signs and the nursing notes.  Pertinent labs & imaging results that were available during my care of the patient were reviewed by me and considered in my medical decision making (see chart for details).    MDM Rules/Calculators/A&P                         Patient presents with a rebound of similar symptoms of pain and recurrent vomiting.  This is happened on multiple prior occasions.  Patient's abdominal exam is nonsurgical.  I have low suspicion for acute abdomen.   Patient's blood sugar is elevated however she has not taken her insulin for greater than 24 hours and no sign of DKA.  Fluid resuscitation initiated.  Patient also given a dose of Haldol, Pepcid, subcutaneous regular insulin and fentanyl.  Symptoms very much improved.  At this time, patient stable to continue her home management regimen. Final Clinical Impression(s) / ED Diagnoses Final diagnoses:  Cannabinoid hyperemesis syndrome  Diabetic gastroparesis associated with type 1 diabetes mellitus ([place])  Chronic abdominal pain    Rx / DC Orders ED Discharge Orders    None       Charlesetta Shanks, MD 12/03/19 1319

## 2019-12-03 NOTE — Discharge Instructions (Addendum)
Be sure to take your Protonix daily as prescribed by Dr. Havery Moros. You must control your blood sugars even when you are sick.  Measure your blood glucose and dose your insulin accordingly. See your doctor for recheck as soon as possible. Marijuana can make gastroparesis worse and even by itself create problems with severe episodes of abdominal pain and vomiting.  In order to improve your symptoms, you will have to quit using any marijuana products.

## 2019-12-03 NOTE — ED Triage Notes (Signed)
Arrived POV from home. Patient reports she was here in ED earlier and left to go home to eat. Patient states she returned because she cannot control abdominal pain.

## 2019-12-04 ENCOUNTER — Emergency Department (HOSPITAL_COMMUNITY)
Admission: EM | Admit: 2019-12-04 | Discharge: 2019-12-04 | Disposition: A | Discharge status: Home / Self Care | Payer: 59 | Attending: Emergency Medicine | Admitting: Emergency Medicine

## 2019-12-04 ENCOUNTER — Other Ambulatory Visit: Payer: Self-pay

## 2019-12-04 DIAGNOSIS — M549 Dorsalgia, unspecified: Secondary | ICD-10-CM | POA: Insufficient documentation

## 2019-12-04 DIAGNOSIS — R109 Unspecified abdominal pain: Secondary | ICD-10-CM | POA: Diagnosis not present

## 2019-12-04 DIAGNOSIS — R0602 Shortness of breath: Secondary | ICD-10-CM | POA: Insufficient documentation

## 2019-12-04 DIAGNOSIS — Z5321 Procedure and treatment not carried out due to patient leaving prior to being seen by health care provider: Secondary | ICD-10-CM | POA: Insufficient documentation

## 2019-12-04 DIAGNOSIS — R110 Nausea: Secondary | ICD-10-CM | POA: Diagnosis not present

## 2019-12-04 MED ORDER — ONDANSETRON 4 MG PO TBDP: 4.0000 mg | ORAL_TABLET | Freq: Once | ORAL | Status: DC | PRN

## 2019-12-04 NOTE — ED Notes (Signed)
Pt called in parking lot lobby and restroom  No answer

## 2019-12-05 ENCOUNTER — Other Ambulatory Visit: Payer: Self-pay | Admitting: Gastroenterology

## 2019-12-06 ENCOUNTER — Other Ambulatory Visit: Payer: Self-pay | Admitting: *Deleted

## 2019-12-06 NOTE — Patient Outreach (Signed)
[facility]) Care Management  12/04/452  RINIYAH SPEICH [birth year] [id number]    Referral Date: 9/1 Referral Source: Insurance Referral Reason: multiple ED visits - 26 in the last 6 months Insurance: [place] attempt #2, unsuccessful, HIPAA compliant voice message left.  Per chart, member has had 3 additional ED visits since initial outreach on 9/2.  Plan: RN CM will follow up within the next 3-4 business days.  Valente David, [place], MSN [place] [phone]

## 2019-12-07 ENCOUNTER — Telehealth: Payer: Self-pay

## 2019-12-07 NOTE — Telephone Encounter (Signed)
.   LAST APPOINTMENT DATE: 11/27/2019   NEXT APPOINTMENT DATE:@Visit  date not found  MEDICATION:metoprolol tartrate (LOPRESSOR) 25 MG tablet  hydrALAZINE (APRESOLINE) 50 MG tablet    PHARMACY:

## 2019-12-08 MED ORDER — HYDRALAZINE HCL 50 MG PO TABS
50.0000 mg | ORAL_TABLET | Freq: Four times a day (QID) | ORAL | 0 refills | Status: DC
Start: 2019-12-08 — End: 2020-01-04

## 2019-12-08 MED ORDER — METOPROLOL TARTRATE 25 MG PO TABS: 25.0000 mg | ORAL_TABLET | Freq: Two times a day (BID) | ORAL | 0 refills | Status: DC

## 2019-12-08 NOTE — Telephone Encounter (Signed)
Pt requesting Rx refills  Last refills done by Swayze sean

## 2019-12-08 NOTE — Telephone Encounter (Signed)
Medications sent in.

## 2019-12-08 NOTE — Telephone Encounter (Signed)
Ok with me. Please place any necessary orders. 

## 2019-12-12 ENCOUNTER — Other Ambulatory Visit: Payer: Self-pay | Admitting: *Deleted

## 2019-12-12 NOTE — Patient Outreach (Signed)
[facility]) Care Management  3/97/9536  [name] [birth year] [id number]   Referral Date:9/1 Referral Source:Insurance Referral Reason:multiple ED visits - 25 in the last 6 months Insurance:[employer]   Outreach attempt #3, successful however member report this is not a good time to talk, request to call this care manager back later.  Will await call back, if no call back will follow up with 4th and final attempt within the next 3 weeks.  Valente David, [place], MSN [place] [phone]

## 2019-12-27 ENCOUNTER — Emergency Department (HOSPITAL_COMMUNITY): Admission: EM | Admit: 2019-12-27 | Discharge: 2019-12-27 | Discharge status: Against Medical Advice | Payer: 59

## 2019-12-27 ENCOUNTER — Emergency Department (HOSPITAL_COMMUNITY)
Admission: EM | Admit: 2019-12-27 | Discharge: 2019-12-27 | Disposition: A | Discharge status: Home / Self Care | Payer: 59 | Attending: Emergency Medicine | Admitting: Emergency Medicine

## 2019-12-27 ENCOUNTER — Other Ambulatory Visit: Payer: Self-pay

## 2019-12-27 ENCOUNTER — Encounter (HOSPITAL_COMMUNITY): Payer: Self-pay | Admitting: Emergency Medicine

## 2019-12-27 DIAGNOSIS — Z5321 Procedure and treatment not carried out due to patient leaving prior to being seen by health care provider: Secondary | ICD-10-CM | POA: Diagnosis not present

## 2019-12-27 DIAGNOSIS — R112 Nausea with vomiting, unspecified: Secondary | ICD-10-CM | POA: Diagnosis not present

## 2019-12-27 DIAGNOSIS — M549 Dorsalgia, unspecified: Secondary | ICD-10-CM | POA: Diagnosis not present

## 2019-12-27 LAB — CBC
HCT: 31.9 % — ABNORMAL LOW (ref 36.0–46.0)
Hemoglobin: 10.1 g/dL — ABNORMAL LOW (ref 12.0–15.0)
MCH: 27.6 pg (ref 26.0–34.0)
MCHC: 31.7 g/dL (ref 30.0–36.0)
MCV: 87.2 fL (ref 80.0–100.0)
Platelets: 235 10*3/uL (ref 150–400)
RBC: 3.66 MIL/uL — ABNORMAL LOW (ref 3.87–5.11)
RDW: 12.5 % (ref 11.5–15.5)
WBC: 5.2 10*3/uL (ref 4.0–10.5)
nRBC: 0 % (ref 0.0–0.2)

## 2019-12-27 LAB — COMPREHENSIVE METABOLIC PANEL
ALT: 23 U/L (ref 0–44)
AST: 21 U/L (ref 15–41)
Albumin: 2.8 g/dL — ABNORMAL LOW (ref 3.5–5.0)
Alkaline Phosphatase: 99 U/L (ref 38–126)
Anion gap: 13 (ref 5–15)
BUN: 26 mg/dL — ABNORMAL HIGH (ref 6–20)
CO2: 22 mmol/L (ref 22–32)
Calcium: 8.9 mg/dL (ref 8.9–10.3)
Chloride: 100 mmol/L (ref 98–111)
Creatinine, Ser: 1.29 mg/dL — ABNORMAL HIGH (ref 0.44–1.00)
GFR calc Af Amer: >60 mL/min (ref >60)
GFR calc non Af Amer: 56 mL/min — ABNORMAL LOW (ref >60)
Glucose, Bld: 233 mg/dL — ABNORMAL HIGH (ref 70–99)
Potassium: 4.6 mmol/L (ref 3.5–5.1)
Sodium: 135 mmol/L (ref 135–145)
Total Bilirubin: 0.4 mg/dL (ref 0.3–1.2)
Total Protein: 7.4 g/dL (ref 6.5–8.1)

## 2019-12-27 LAB — LIPASE, BLOOD: Lipase: 20 U/L (ref 11–51)

## 2019-12-27 NOTE — ED Triage Notes (Signed)
Patient is complaining of nausea, vomiting, and back pain that started yesterday.

## 2019-12-28 NOTE — Progress Notes (Addendum)
12/28/2019 548 pm  TOC CM referral to follow up with patient to assist with arranging follow up appts and resources in the community that may assist pt.   [place], [place] ED TOC CM [phone]

## 2020-01-03 ENCOUNTER — Other Ambulatory Visit: Payer: Self-pay | Admitting: *Deleted

## 2020-01-03 NOTE — Patient Outreach (Signed)
[facility]) Care Management  01/03/2020  [name] [facility] [birth year] [id number]   Outreach attempt #4, unsuccessful.  HIPAA compliant voice message left.  Calls also placed to member's husband and mother without success, voice messages left.  Noted per chart that member is currently admitted to outside hospital.  Will plan to contact within the next week.    Update:  Incoming call received from member's mother, she state she is waiting to receive update from member.  Confirms that she remains in the hospital since 9/29, possible discharge today.  She will have member contact this care manager once she is home.  Will await call back, if no call will follow up within the next week as planned.  Kemper Durie, [place], MSN [facility] Care Management  [facility] Manager [phone]

## 2020-01-04 ENCOUNTER — Other Ambulatory Visit: Payer: Self-pay | Admitting: Family Medicine

## 2020-01-05 ENCOUNTER — Other Ambulatory Visit: Payer: Self-pay | Admitting: *Deleted

## 2020-01-05 NOTE — Patient Outreach (Signed)
[facility]) Care Management  71/08/9676  Reegan Mctighe [facility] [birth year] [id number]   Outreach attempt #5, unsuccessful.  Noted that member was discharged home from [facility] on 10/5.  Call placed, no answer, HIPAA complaint voice message left.  Will discuss case with [facility] leadership as this care manager has been unable to reach member despite multiple call attempts to member, husband, and mother.  Valente David, [place], MSN [place] [phone]

## 2020-01-12 ENCOUNTER — Other Ambulatory Visit: Payer: Self-pay | Admitting: Family Medicine

## 2020-01-16 ENCOUNTER — Other Ambulatory Visit: Payer: Self-pay | Admitting: *Deleted

## 2020-01-16 MED ORDER — MIRTAZAPINE 30 MG PO TABS
30.0000 mg | ORAL_TABLET | Freq: Every day | ORAL | 11 refills | Status: DC
Start: 2020-01-16 — End: 2020-01-17

## 2020-01-17 ENCOUNTER — Other Ambulatory Visit: Payer: Self-pay | Admitting: Family Medicine

## 2020-01-18 ENCOUNTER — Encounter (HOSPITAL_COMMUNITY): Payer: Self-pay

## 2020-01-18 ENCOUNTER — Other Ambulatory Visit: Payer: Self-pay

## 2020-01-18 DIAGNOSIS — Z794 Long term (current) use of insulin: Secondary | ICD-10-CM | POA: Diagnosis not present

## 2020-01-18 DIAGNOSIS — E119 Type 2 diabetes mellitus without complications: Secondary | ICD-10-CM | POA: Insufficient documentation

## 2020-01-18 DIAGNOSIS — Z8616 Personal history of COVID-19: Secondary | ICD-10-CM | POA: Insufficient documentation

## 2020-01-18 DIAGNOSIS — M546 Pain in thoracic spine: Secondary | ICD-10-CM | POA: Diagnosis not present

## 2020-01-18 DIAGNOSIS — R112 Nausea with vomiting, unspecified: Secondary | ICD-10-CM | POA: Diagnosis not present

## 2020-01-18 DIAGNOSIS — I10 Essential (primary) hypertension: Secondary | ICD-10-CM | POA: Diagnosis not present

## 2020-01-18 DIAGNOSIS — Z79899 Other long term (current) drug therapy: Secondary | ICD-10-CM | POA: Insufficient documentation

## 2020-01-18 MED ORDER — ONDANSETRON 4 MG PO TBDP
4.0000 mg | ORAL_TABLET | Freq: Once | ORAL | Status: AC | PRN
  Administered 2020-01-18: 4 mg via ORAL
  Filled 2020-01-18: qty 1

## 2020-01-18 NOTE — ED Triage Notes (Signed)
Patient arrived with complaints of mid back pain and N/V that started yesterday.

## 2020-01-19 ENCOUNTER — Inpatient Hospital Stay: Payer: 59 | Admitting: Family Medicine

## 2020-01-19 ENCOUNTER — Encounter (HOSPITAL_COMMUNITY): Payer: Self-pay

## 2020-01-19 ENCOUNTER — Emergency Department (HOSPITAL_COMMUNITY)
Admission: EM | Admit: 2020-01-19 | Discharge: 2020-01-19 | Disposition: A | Discharge status: Home / Self Care | Payer: 59 | Attending: Emergency Medicine | Admitting: Emergency Medicine

## 2020-01-19 ENCOUNTER — Other Ambulatory Visit: Payer: Self-pay

## 2020-01-19 ENCOUNTER — Other Ambulatory Visit: Payer: Self-pay | Admitting: Family Medicine

## 2020-01-19 ENCOUNTER — Emergency Department (HOSPITAL_COMMUNITY): Payer: 59

## 2020-01-19 ENCOUNTER — Other Ambulatory Visit: Payer: Self-pay | Admitting: Endocrinology

## 2020-01-19 DIAGNOSIS — R112 Nausea with vomiting, unspecified: Secondary | ICD-10-CM

## 2020-01-19 DIAGNOSIS — M545 Low back pain, unspecified: Secondary | ICD-10-CM | POA: Insufficient documentation

## 2020-01-19 DIAGNOSIS — M546 Pain in thoracic spine: Secondary | ICD-10-CM

## 2020-01-19 DIAGNOSIS — Z5321 Procedure and treatment not carried out due to patient leaving prior to being seen by health care provider: Secondary | ICD-10-CM | POA: Insufficient documentation

## 2020-01-19 DIAGNOSIS — E1069 Type 1 diabetes mellitus with other specified complication: Secondary | ICD-10-CM

## 2020-01-19 LAB — COMPREHENSIVE METABOLIC PANEL
ALT: 24 U/L (ref 0–44)
AST: 31 U/L (ref 15–41)
Albumin: 2.5 g/dL — ABNORMAL LOW (ref 3.5–5.0)
Alkaline Phosphatase: 66 U/L (ref 38–126)
Anion gap: 9 (ref 5–15)
BUN: 28 mg/dL — ABNORMAL HIGH (ref 6–20)
CO2: 22 mmol/L (ref 22–32)
Calcium: 7.2 mg/dL — ABNORMAL LOW (ref 8.9–10.3)
Chloride: 105 mmol/L (ref 98–111)
Creatinine, Ser: 1.22 mg/dL — ABNORMAL HIGH (ref 0.44–1.00)
GFR, Estimated: 59 mL/min — ABNORMAL LOW (ref >60)
Glucose, Bld: 70 mg/dL (ref 70–99)
Potassium: 3.7 mmol/L (ref 3.5–5.1)
Sodium: 136 mmol/L (ref 135–145)
Total Bilirubin: 0.6 mg/dL (ref 0.3–1.2)
Total Protein: 6.4 g/dL — ABNORMAL LOW (ref 6.5–8.1)

## 2020-01-19 LAB — URINALYSIS, ROUTINE W REFLEX MICROSCOPIC
Bilirubin Urine: NEGATIVE
Glucose, UA: NEGATIVE mg/dL
Ketones, ur: NEGATIVE mg/dL
Leukocytes,Ua: NEGATIVE
Nitrite: NEGATIVE
Protein, ur: 100 mg/dL — AB
Specific Gravity, Urine: >1.03 — ABNORMAL HIGH (ref 1.005–1.030)
pH: 6 (ref 5.0–8.0)

## 2020-01-19 LAB — CBC WITH DIFFERENTIAL/PLATELET
Abs Immature Granulocytes: 0.02 10*3/uL (ref 0.00–0.07)
Basophils Absolute: 0 10*3/uL (ref 0.0–0.1)
Basophils Relative: 1 %
Eosinophils Absolute: 0.3 10*3/uL (ref 0.0–0.5)
Eosinophils Relative: 5 %
HCT: 29.2 % — ABNORMAL LOW (ref 36.0–46.0)
Hemoglobin: 9.1 g/dL — ABNORMAL LOW (ref 12.0–15.0)
Immature Granulocytes: 0 %
Lymphocytes Relative: 33 %
Lymphs Abs: 1.8 10*3/uL (ref 0.7–4.0)
MCH: 28 pg (ref 26.0–34.0)
MCHC: 31.2 g/dL (ref 30.0–36.0)
MCV: 89.8 fL (ref 80.0–100.0)
Monocytes Absolute: 0.5 10*3/uL (ref 0.1–1.0)
Monocytes Relative: 9 %
Neutro Abs: 2.9 10*3/uL (ref 1.7–7.7)
Neutrophils Relative %: 52 %
Platelets: 241 10*3/uL (ref 150–400)
RBC: 3.25 MIL/uL — ABNORMAL LOW (ref 3.87–5.11)
RDW: 12.8 % (ref 11.5–15.5)
WBC: 5.5 10*3/uL (ref 4.0–10.5)
nRBC: 0 % (ref 0.0–0.2)

## 2020-01-19 LAB — LIPASE, BLOOD: Lipase: 20 U/L (ref 11–51)

## 2020-01-19 LAB — D-DIMER, QUANTITATIVE: D-Dimer, Quant: 1.06 ug/mL-FEU — ABNORMAL HIGH (ref 0.00–0.50)

## 2020-01-19 LAB — I-STAT BETA HCG BLOOD, ED (MC, WL, AP ONLY): I-stat hCG, quantitative: <5 m[IU]/mL (ref <5)

## 2020-01-19 LAB — URINALYSIS, MICROSCOPIC (REFLEX)

## 2020-01-19 IMAGING — CR DG CHEST 2V
2 series · 2 of 2 positions shown · non-contrast
Comparison: [DATE]

CLINICAL DATA: Mid upper back pain with nausea and vomiting since
yesterday.

EXAM:
CHEST - 2 VIEW

[w chest pa]
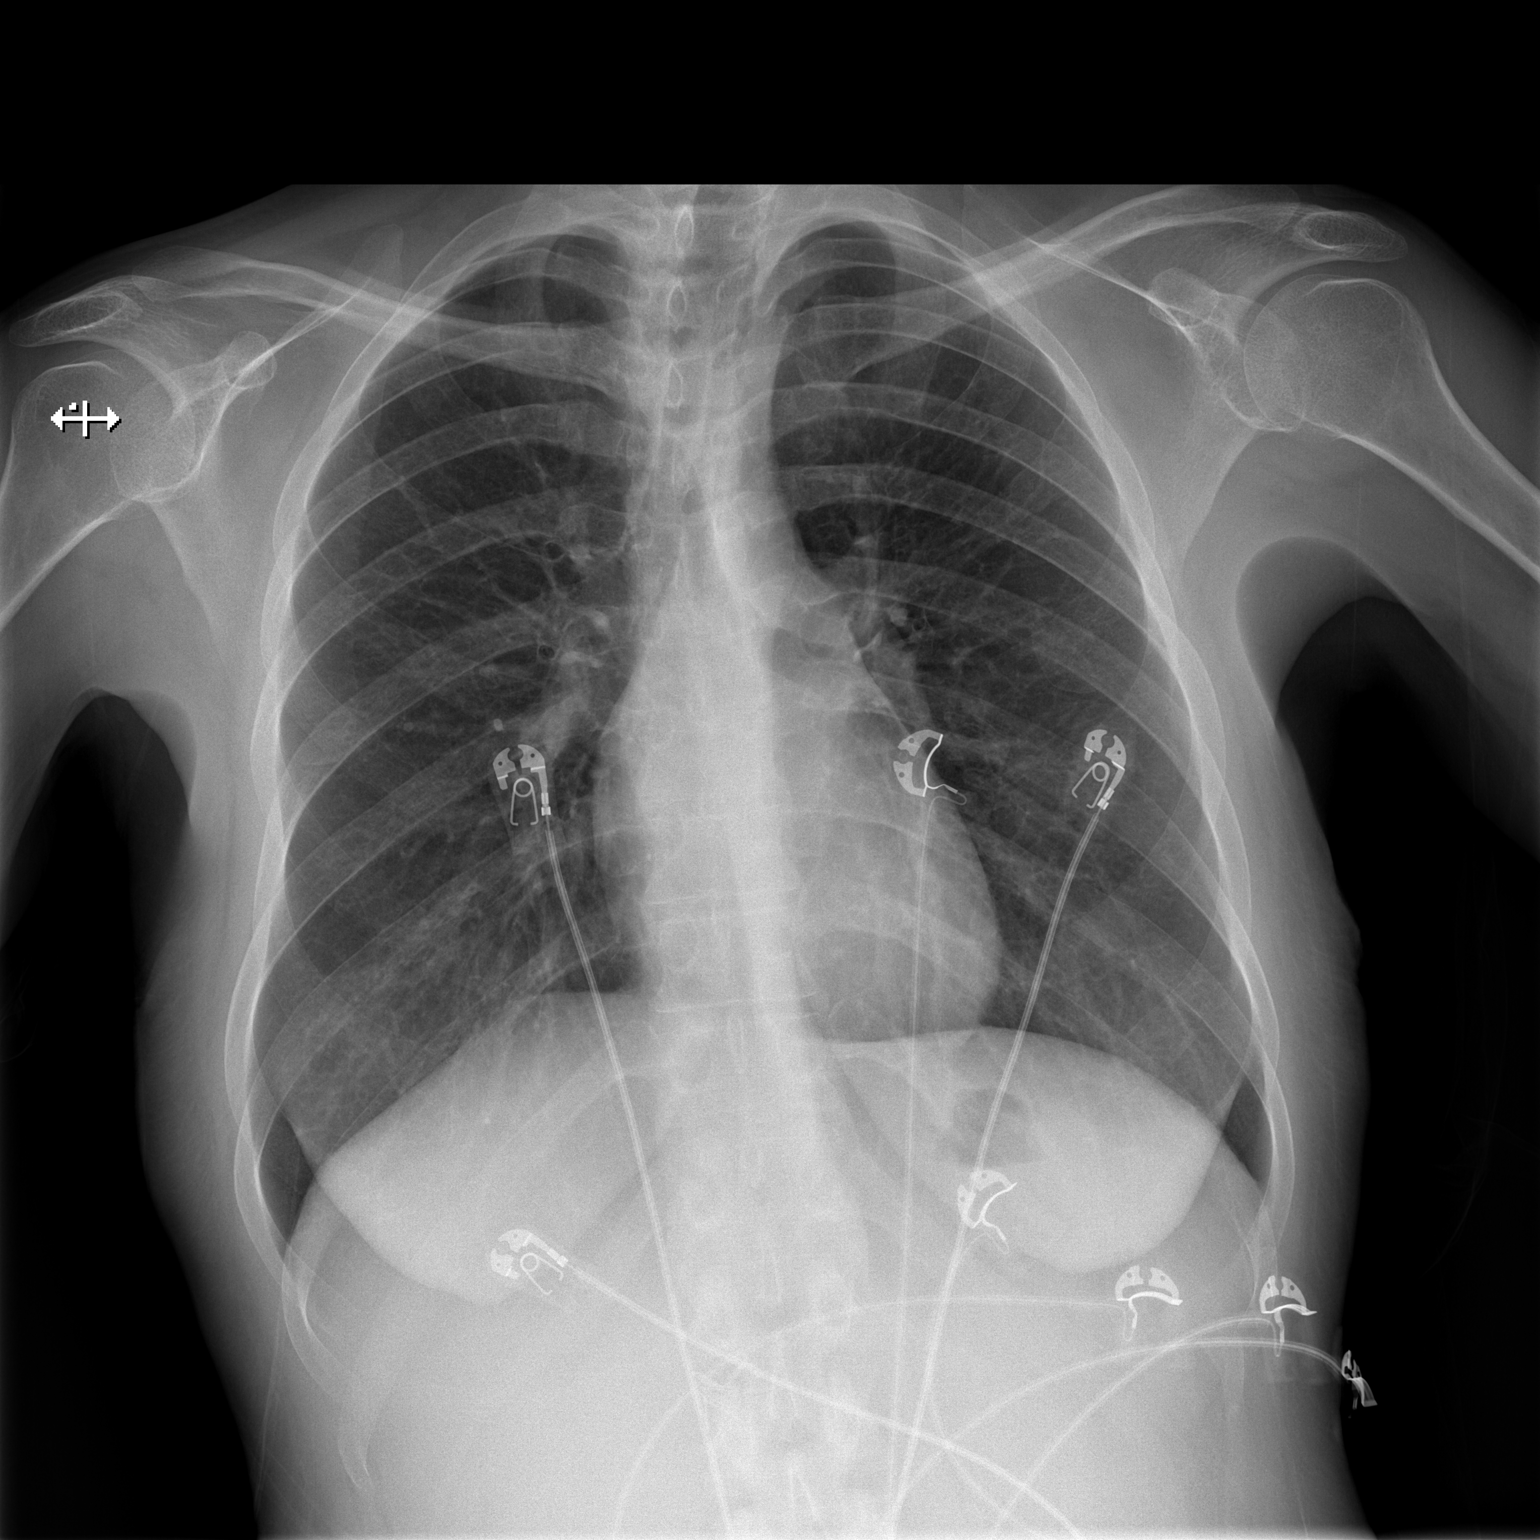

[w chest lat]
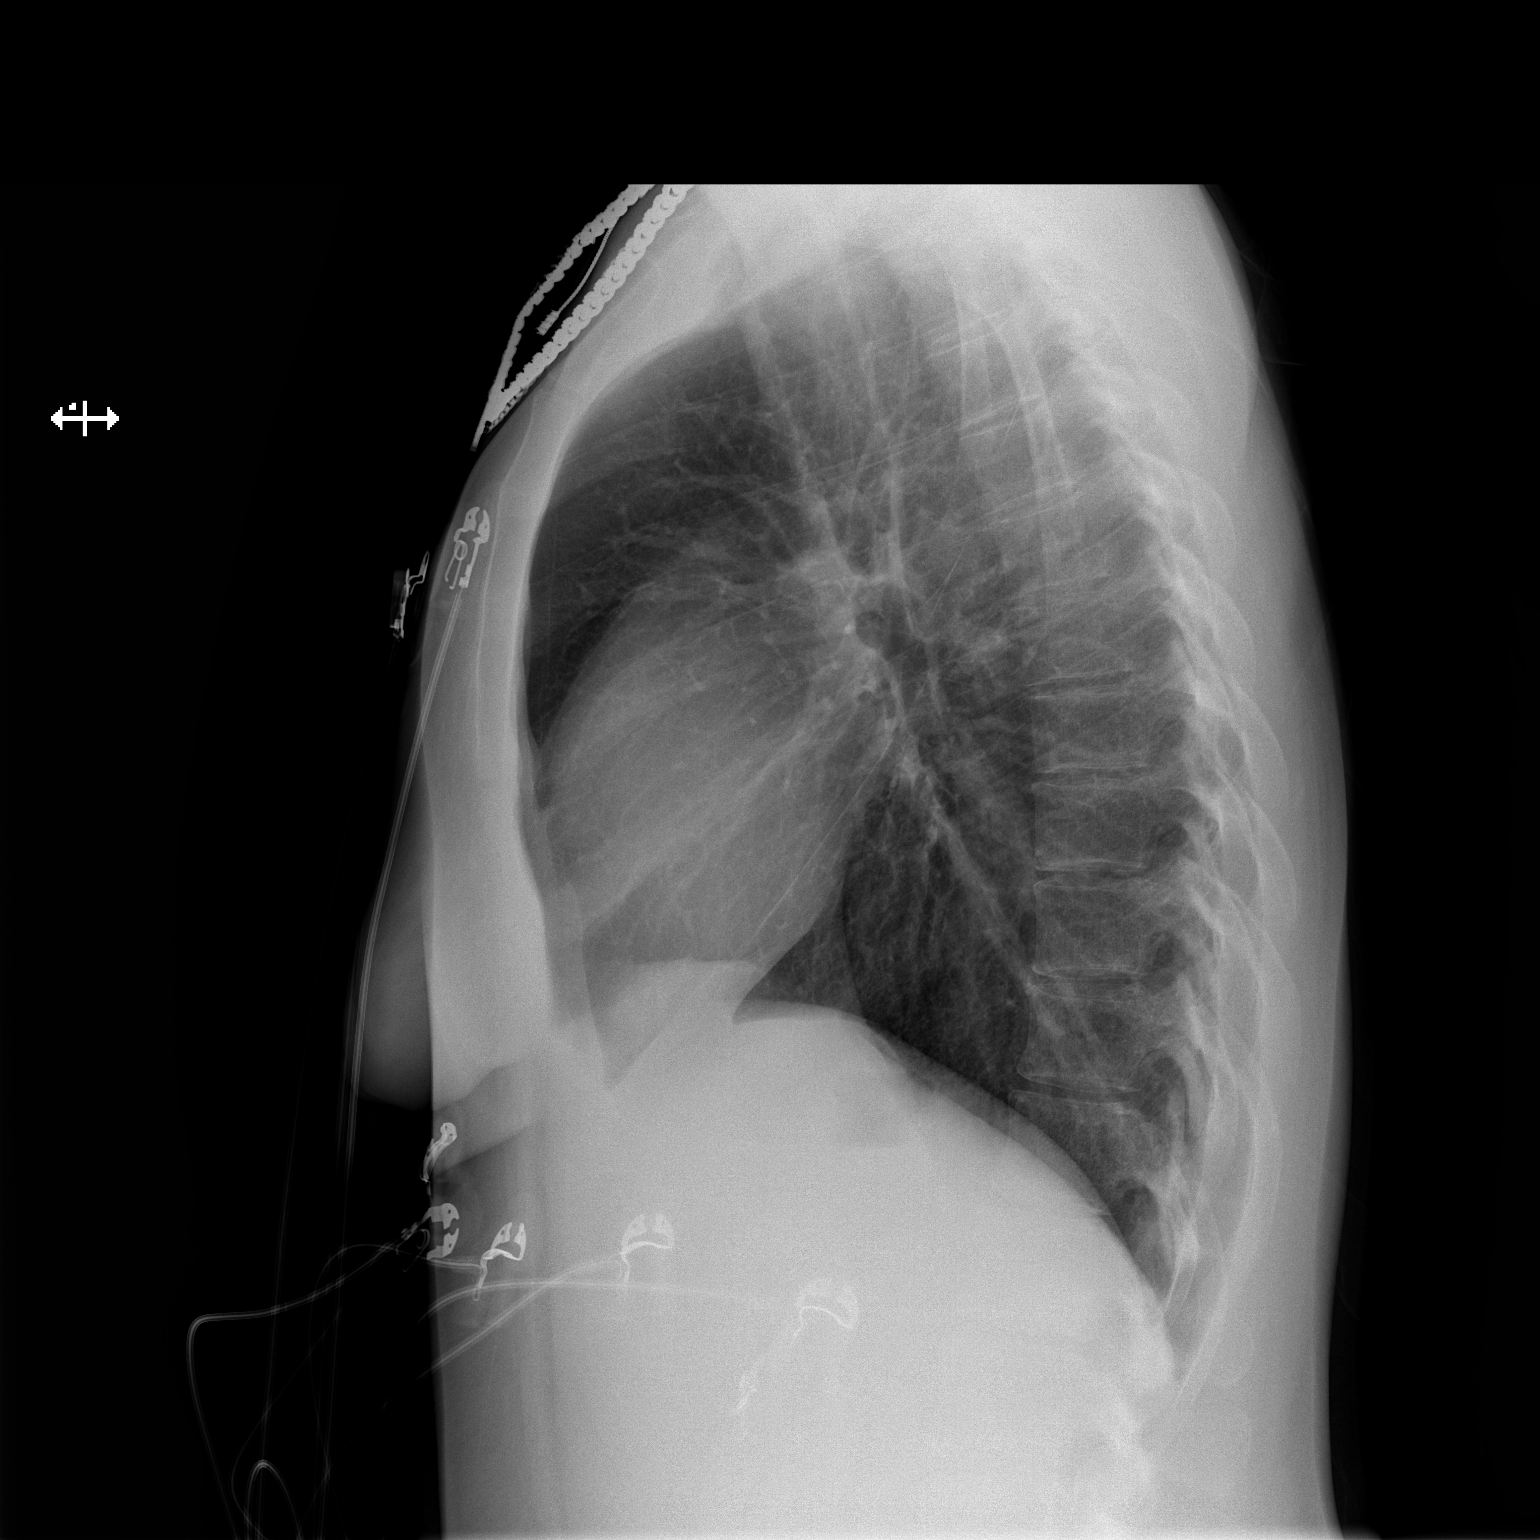

[2 of 2 positions shown; findings below may reference images not displayed]

FINDINGS: The heart size and mediastinal contours are within normal limits.
Both lungs are clear. The visualized skeletal structures are
unremarkable.
IMPRESSION: No active cardiopulmonary disease.

## 2020-01-19 MED ORDER — FENTANYL CITRATE (PF) 100 MCG/2ML IJ SOLN
100.0000 ug | Freq: Once | INTRAMUSCULAR | Status: AC
  Administered 2020-01-19: 100 ug via INTRAVENOUS
  Filled 2020-01-19: qty 2

## 2020-01-19 MED ORDER — FENTANYL CITRATE (PF) 100 MCG/2ML IJ SOLN
100.0000 ug | Freq: Once | INTRAMUSCULAR | Status: AC
  Administered 2020-01-19: 100 ug via INTRAMUSCULAR
  Filled 2020-01-19: qty 2

## 2020-01-19 MED ORDER — ONDANSETRON HCL 4 MG/2ML IJ SOLN
4.0000 mg | Freq: Once | INTRAMUSCULAR | Status: AC
  Administered 2020-01-19: 4 mg via INTRAVENOUS
  Filled 2020-01-19: qty 2

## 2020-01-19 MED ORDER — PANTOPRAZOLE SODIUM 40 MG IV SOLR
40.0000 mg | Freq: Once | INTRAVENOUS | Status: AC
  Administered 2020-01-19: 40 mg via INTRAVENOUS
  Filled 2020-01-19: qty 40

## 2020-01-19 MED ORDER — FENTANYL CITRATE (PF) 100 MCG/2ML IJ SOLN: 50.0000 ug | Freq: Once | INTRAMUSCULAR | Status: DC

## 2020-01-19 MED ORDER — ONDANSETRON 4 MG PO TBDP
4.0000 mg | ORAL_TABLET | Freq: Once | ORAL | Status: AC | PRN
  Administered 2020-01-19: 4 mg via ORAL
  Filled 2020-01-19: qty 1

## 2020-01-19 MED ORDER — KETOROLAC TROMETHAMINE 15 MG/ML IJ SOLN
15.0000 mg | Freq: Once | INTRAMUSCULAR | Status: AC
  Administered 2020-01-19: 15 mg via INTRAVENOUS
  Filled 2020-01-19: qty 1

## 2020-01-19 NOTE — ED Provider Notes (Signed)
[facility] DEPT Provider Note: Georgena Spurling, MD, FACEP  CSN: [id number] MRN: [id number] ARRIVAL: 01/18/20 at 2149 ROOM: [facility]  Back Pain and Vomiting   HISTORY OF PRESENT ILLNESS  70/62/37 6:28 AM [name] is a 29 y.o. 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  She is here with mid upper back pain that began yesterday morning.  The pain has both burning and sharp components.  She rates it as a 9 out of 10.  It is only mildly changed with movement or palpation and not increased with breathing.  She has had associated nausea and vomiting as well.  She denies abdominal pain or diarrhea.  She has not had a fever with this.  She was given 4 mg of Zofran ODT in triage with improvement in her nausea.    Past Medical History:  Diagnosis Date  . Allergy   . Anemia   . Anxiety   . Blood transfusion without reported diagnosis    Dec 2018  . Cataract    right eye  . COVID-19   . Depression   . Diabetes type 1, uncontrolled ([place]) 11/14/2011   Since age 20  . Fibromyalgia   . Gastroparesis   . GERD (gastroesophageal reflux disease)   . Hypertension   . Infection    UTI April 2016    Past Surgical History:  Procedure Laterality Date  . ANKLE SURGERY    . CHOLECYSTECTOMY  11/15/2011   Procedure: LAPAROSCOPIC CHOLECYSTECTOMY WITH INTRAOPERATIVE CHOLANGIOGRAM;  Surgeon: [name], MD;  Location: [facility];  Service: General;  Laterality: N/A;  . COLONOSCOPY    . COLONOSCOPY WITH PROPOFOL N/A 06/27/2017   Procedure: COLONOSCOPY WITH PROPOFOL;  Surgeon: Milus Banister, MD;  Location: [facility];  Service: Endoscopy;  Laterality: N/A;  . ESOPHAGOGASTRODUODENOSCOPY  12/03/2011   Procedure: ESOPHAGOGASTRODUODENOSCOPY (EGD);  Surgeon: Beryle Beams, MD;  Location: [facility];  Service: Endoscopy;  Laterality: N/A;  . FLEXIBLE SIGMOIDOSCOPY N/A 03/10/2017   Procedure: FLEXIBLE SIGMOIDOSCOPY;   Surgeon: Carol Ada, MD;  Location: [facility];  Service: Endoscopy;  Laterality: N/A;  . INCISION AND DRAINAGE PERIRECTAL ABSCESS N/A 03/01/2017   Procedure: IRRIGATION AND DEBRIDEMENT PERIRECTAL ABSCESS;  Surgeon: Alphonsa Overall, MD;  Location: [facility];  Service: General;  Laterality: N/A;  . IRRIGATION AND DEBRIDEMENT BUTTOCKS N/A 03/23/2017   Procedure: IRRIGATION AND DEBRIDEMENT BUTTOCKS, SETON PLACEMENT;  Surgeon: [name], MD;  Location: [facility];  Service: General;  Laterality: N/A;  . LAPAROSCOPY  11/23/2011   Procedure: LAPAROSCOPY DIAGNOSTIC;  Surgeon: Edward Jolly, MD;  Location: [facility];  Service: General;  Laterality: N/A;  . SIGMOIDOSCOPY    . UPPER GASTROINTESTINAL ENDOSCOPY    . WISDOM TOOTH EXTRACTION      Family History  Problem Relation Age of Onset  . Diabetes Mother   . Hypertension Father   . Colon cancer Paternal Grandmother        pt thinks PGM was dx in her 18's  . Diabetes Paternal Grandmother   . Diabetes Maternal Grandmother   . Diabetes Maternal Grandfather   . Diabetes Paternal Grandfather   . Diabetes Other   . Esophageal cancer Neg Hx   . Liver cancer Neg Hx   . Pancreatic cancer Neg Hx   . Stomach cancer Neg Hx   . Rectal cancer Neg Hx     Social History   Tobacco Use  . Smoking status: Never Smoker  .  Smokeless tobacco: Never Used  Vaping Use  . Vaping Use: Never used  Substance Use Topics  . Alcohol use: No    Alcohol/week: 0.0 standard drinks  . Drug use: Yes    Types: Marijuana    Prior to Admission medications   Medication Sig Start Date End Date Taking? Authorizing Provider  aluminum-magnesium hydroxide-simethicone (MAALOX) [phone] MG/5ML SUSP Take 30 mLs by mouth 3 (three) times daily as needed (indigestion).   Yes [provider]  cyclobenzaprine (FLEXERIL) 10 MG tablet TAKE 1 TABLET(10 MG) BY MOUTH THREE TIMES DAILY AS NEEDED FOR MUSCLE SPASMS Patient taking differently: Take 10 mg by mouth 3 (three)  times daily as needed for muscle spasms.  11/28/19  Yes [name], MD  dicyclomine (BENTYL) 20 MG tablet Take 1 tablet (20 mg total) by mouth every 8 (eight) hours as needed for spasms. 11/25/19  Yes Petrucelli, Samantha R, PA-C  ferrous sulfate 325 (65 FE) MG tablet Take 1 tablet (325 mg total) by mouth daily. 11/25/19  Yes Petrucelli, Samantha R, PA-C  hydrALAZINE (APRESOLINE) 50 MG tablet TAKE 1 TABLET(50 MG) BY MOUTH EVERY 6 HOURS Patient taking differently: Take 50 mg by mouth every 6 (six) hours.  01/04/20  Yes [name], MD  Insulin Glargine (LANTUS SOLOSTAR) 100 UNIT/ML Solostar Pen Inject 10 Units into the skin at bedtime. And pen needles 4/day 12/31/18  Yes Renato Shin, MD  insulin lispro (HUMALOG KWIKPEN) 100 UNIT/ML KwikPen Inject 0.06 mLs (6 Units total) into the skin 3 (three) times daily. Patient taking differently: Inject 1-9 Units into the skin 3 (three) times daily. Per sliding scale 12/31/18  Yes Renato Shin, MD  isosorbide mononitrate (IMDUR) 30 MG 24 hr tablet Take 30 mg by mouth daily. 01/03/20  Yes [provider]  loperamide (IMODIUM) 2 MG capsule Take 6 mg by mouth daily as needed for diarrhea or loose stools.    Yes [provider]  LORazepam (ATIVAN) 0.5 MG tablet Take 1 tablet (0.5 mg total) by mouth 2 (two) times daily as needed for anxiety. 06/30/19  Yes [name], MD  metoCLOPramide (REGLAN) 5 MG tablet Take 1 tablet (5 mg total) by mouth in the morning and at bedtime. 10/04/19  Yes Armbruster, Carlota Raspberry, MD  metoprolol tartrate (LOPRESSOR) 25 MG tablet TAKE 1 TABLET(25 MG) BY MOUTH TWICE DAILY Patient taking differently: Take 25 mg by mouth 2 (two) times daily.  01/04/20  Yes [name], MD  mirtazapine (REMERON) 30 MG tablet TAKE 1 TABLET BY MOUTH AT  BEDTIME Patient taking differently: Take 30 mg by mouth at bedtime.  01/17/20  Yes [name], MD  ondansetron (ZOFRAN) 4 MG tablet Take 1 tablet (4 mg total) by mouth every 8 (eight)  hours as needed for up to 20 doses for nausea or vomiting. 11/23/19  Yes Curatolo, Adam, DO  pantoprazole (PROTONIX) 40 MG tablet Take 1 tablet (40 mg total) by mouth 2 (two) times daily. 12/06/19  Yes Armbruster, Carlota Raspberry, MD  promethazine (PHENERGAN) 12.5 MG tablet TAKE 1 TABLET(12.5 MG) BY MOUTH EVERY 8 HOURS AS NEEDED FOR NAUSEA OR VOMITING Patient taking differently: Take 12.5 mg by mouth every 8 (eight) hours as needed for nausea or vomiting.  11/25/19  Yes Armbruster, Carlota Raspberry, MD  sucralfate (CARAFATE) 1 GM/10ML suspension Take 10 mLs (1 g total) by mouth every 6 (six) hours as needed. Patient taking differently: Take 1 g by mouth every 6 (six) hours as needed (stomach).  11/24/19  Yes Armbruster, Carlota Raspberry, MD  Continuous Blood Gluc Sensor (DEXCOM G6 SENSOR) MISC 1 each by Does not apply route See admin instructions. every 10 days. 12/31/18   Renato Shin, MD  potassium chloride SA (KLOR-CON) 20 MEQ tablet Take 1 tablet (20 mEq total) by mouth daily for 5 doses. 07/04/19 07/30/19  Janeece Fitting, PA-C    Allergies Other and Lactose intolerance (gi)   REVIEW OF SYSTEMS  Negative except as noted here or in the History of Present Illness.   PHYSICAL EXAMINATION  Initial Vital Signs Blood pressure (!) 142/105, pulse (!) 103, temperature 98.3 F (36.8 C), temperature source Oral, resp. rate 16, last menstrual period 11/18/2019, SpO2 98 %.  Examination General: Well-developed, well-nourished female in no acute distress; pacing; appearance consistent with age of record HENT: normocephalic; atraumatic Eyes: pupils equal, round and reactive to light; extraocular muscles intact Neck: supple Heart: regular rate and rhythm Lungs: clear to auscultation bilaterally Abdomen: soft; nondistended; nontender;  bowel sounds present Back: Minimal mid upper back tenderness Extremities: No deformity; full range of motion; pulses normal Neurologic: Awake, alert and oriented; motor function intact in all  extremities and symmetric; no facial droop Skin: Warm and dry Psychiatric: Tearful   RESULTS  Summary of this visit's results, reviewed and interpreted by myself:   EKG Interpretation  Date/Time:  Thursday January 19 2020 04:20:55 EDT Ventricular Rate:  106 PR Interval:    QRS Duration: 80 QT Interval:  341 QTC Calculation: 453 R Axis:   72 Text Interpretation: Sinus tachycardia Ventricular premature complex Aberrant complex ST elev, probable normal early repol pattern Baseline wander in lead(s) II No significant change was found Confirmed by Shanon Rosser [phone]) on 01/19/2020 4:54:17 AM      Laboratory Studies: Results for orders placed or performed during the hospital encounter of 01/19/20 (from the past 24 hour(s))  Urinalysis, Routine w reflex microscopic     Status: Abnormal   Collection Time: 01/19/20  2:30 AM  Result Value Ref Range   Color, Urine YELLOW YELLOW   APPearance CLEAR CLEAR   Specific Gravity, Urine >1.030 (H) 1.005 - 1.030   pH 6.0 5.0 - 8.0   Glucose, UA NEGATIVE NEGATIVE mg/dL   Hgb urine dipstick MODERATE (A) NEGATIVE   Bilirubin Urine NEGATIVE NEGATIVE   Ketones, ur NEGATIVE NEGATIVE mg/dL   Protein, ur 100 (A) NEGATIVE mg/dL   Nitrite NEGATIVE NEGATIVE   Leukocytes,Ua NEGATIVE NEGATIVE  Urinalysis, Microscopic (reflex)     Status: Abnormal   Collection Time: 01/19/20  2:30 AM  Result Value Ref Range   RBC / HPF 6-10 0 - 5 RBC/hpf   WBC, UA 0-5 0 - 5 WBC/hpf   Bacteria, UA FEW (A) NONE SEEN   Squamous Epithelial / LPF 11-20 0 - 5   Mucus PRESENT    Hyaline Casts, UA PRESENT   Lipase, blood     Status: None   Collection Time: 01/19/20  3:15 AM  Result Value Ref Range   Lipase 20 11 - 51 U/L  Comprehensive metabolic panel     Status: Abnormal   Collection Time: 01/19/20  3:15 AM  Result Value Ref Range   Sodium 136 135 - 145 mmol/L   Potassium 3.7 3.5 - 5.1 mmol/L   Chloride 105 98 - 111 mmol/L   CO2 22 22 - 32 mmol/L   Glucose, Bld 70 70  - 99 mg/dL   BUN 28 (H) 6 - 20 mg/dL   Creatinine, Ser 1.22 (  H) 0.44 - 1.00 mg/dL   Calcium 7.2 (L) 8.9 - 10.3 mg/dL   Total Protein 6.4 (L) 6.5 - 8.1 g/dL   Albumin 2.5 (L) 3.5 - 5.0 g/dL   AST 31 15 - 41 U/L   ALT 24 0 - 44 U/L   Alkaline Phosphatase 66 38 - 126 U/L   Total Bilirubin 0.6 0.3 - 1.2 mg/dL   GFR, Estimated 59 (L) >60 mL/min   Anion gap 9 5 - 15  CBC with Differential/Platelet     Status: Abnormal   Collection Time: 01/19/20  3:15 AM  Result Value Ref Range   WBC 5.5 4.0 - 10.5 K/uL   RBC 3.25 (L) 3.87 - 5.11 MIL/uL   Hemoglobin 9.1 (L) 12.0 - 15.0 g/dL   HCT 29.2 (L) 36 - 46 %   MCV 89.8 80.0 - 100.0 fL   MCH 28.0 26.0 - 34.0 pg   MCHC 31.2 30.0 - 36.0 g/dL   RDW 12.8 11.5 - 15.5 %   Platelets 241 150 - 400 K/uL   nRBC 0.0 0.0 - 0.2 %   Neutrophils Relative % 52 %   Neutro Abs 2.9 1.7 - 7.7 K/uL   Lymphocytes Relative 33 %   Lymphs Abs 1.8 0.7 - 4.0 K/uL   Monocytes Relative 9 %   Monocytes Absolute 0.5 0.1 - 1.0 K/uL   Eosinophils Relative 5 %   Eosinophils Absolute 0.3 0.0 - 0.5 K/uL   Basophils Relative 1 %   Basophils Absolute 0.0 0.0 - 0.1 K/uL   Immature Granulocytes 0 %   Abs Immature Granulocytes 0.02 0.00 - 0.07 K/uL  I-Stat beta hCG blood, ED     Status: None   Collection Time: 01/19/20  3:28 AM  Result Value Ref Range   I-stat hCG, quantitative <5.0 <5 mIU/mL   Comment 3          D-dimer, quantitative (not at [facility])     Status: Abnormal   Collection Time: 01/19/20  4:46 AM  Result Value Ref Range   D-Dimer, Quant 1.06 (H) 0.00 - 0.50 ug/mL-FEU   Imaging Studies: DG Chest 2 View  Result Date: 01/19/2020 CLINICAL DATA:  Mid upper back pain with nausea and vomiting since yesterday. EXAM: CHEST - 2 VIEW COMPARISON:  12/28/2019 FINDINGS: The heart size and mediastinal contours are within normal limits. Both lungs are clear. The visualized skeletal structures are unremarkable. IMPRESSION: No active cardiopulmonary disease. Electronically Signed    By: Lucienne Capers M.D.   On: 01/19/2020 06:46    ED COURSE and MDM  Nursing notes, initial and subsequent vitals signs, including pulse oximetry, reviewed and interpreted by myself.  Vitals:   01/19/20 0100 01/19/20 0145 01/19/20 0330 01/19/20 0545  BP: (!) 133/98 113/82 (!) 159/112 (!) 152/111  Pulse: (!) 106 (!) 107 (!) 109 (!) 112  Resp: 18 20 16 18   Temp:      TempSrc:      SpO2: 99% 100% 100% 100%   Medications  ketorolac (TORADOL) 15 MG/ML injection 15 mg (has no administration in time range)  ondansetron (ZOFRAN-ODT) disintegrating tablet 4 mg (4 mg Oral Given 01/18/20 2159)  fentaNYL (SUBLIMAZE) injection 100 mcg (100 mcg Intramuscular Given 01/19/20 0129)  ondansetron (ZOFRAN) injection 4 mg (4 mg Intravenous Given 01/19/20 0354)  fentaNYL (SUBLIMAZE) injection 100 mcg (100 mcg Intravenous Given 01/19/20 0356)  pantoprazole (PROTONIX) injection 40 mg (40 mg Intravenous Given 01/19/20 0616)   Patient has not been observed to vomit  while in the ED.  Her BUN and creatinine are mildly elevated, likely due to dehydration.  She was given IV fluids in the ED.  The cause of her back pain is unclear but her chest x-ray is unremarkable.  Her oxygen saturation is 100% and I have a low index of suspicion of PE or acute coronary syndrome.  Given her cannabis abuse I do not wish to treat her with a narcotic.  PROCEDURES  Procedures   ED DIAGNOSES     ICD-10-CM   1. Nausea and vomiting in adult  R11.2   2. Acute midline thoracic back pain  M54.6        Denajah Farias, Jenny Reichmann, MD 01/19/20 [phone]

## 2020-01-19 NOTE — ED Triage Notes (Signed)
Patient returns to ED stating that after discharge yesterday she went home and the NV returned. Also has complaints of back pain.

## 2020-01-19 NOTE — Progress Notes (Signed)
IV Team consult note:  Spoke with RN. Stated IV needed with DBIV. Patient may need IV PRN meds. Placed G22 to RAFA ,with blood return upon access but unable to obtain enough blood back for lab work.RN notified.

## 2020-01-19 NOTE — ED Notes (Signed)
Pt attempted to give urine sample, says she is unable to urinate at this time but she will let me know when she is able to try

## 2020-01-20 ENCOUNTER — Emergency Department (HOSPITAL_COMMUNITY)
Admission: EM | Admit: 2020-01-20 | Discharge: 2020-01-20 | Disposition: A | Discharge status: Home / Self Care | Payer: 59 | Source: Home / Self Care

## 2020-01-20 ENCOUNTER — Ambulatory Visit: Payer: 59 | Admitting: Gastroenterology

## 2020-01-20 NOTE — Telephone Encounter (Signed)
LAST APPOINTMENT DATE: 01/17/2020   NEXT APPOINTMENT DATE: 01/27/2020    LAST REFILL: 07/20/2019  QTY: 60 Ref 5

## 2020-01-22 LAB — BASIC METABOLIC PANEL
BUN: 48 — AB (ref 4–21)
BUN: 48 — AB (ref 4–21)
CO2: 25 — AB (ref 13–22)
Chloride: 99 (ref 99–108)
Creatinine: 3.3 — AB (ref 0.5–1.1)
Glucose: 212
Potassium: 3.2 — AB (ref 3.4–5.3)
Sodium: 137 (ref 137–147)

## 2020-01-22 LAB — COMPREHENSIVE METABOLIC PANEL
Albumin: 3.4 — AB (ref 3.5–5.0)
Calcium: 8.6 — AB (ref 8.7–10.7)
GFR calc Af Amer: 21

## 2020-01-22 LAB — CBC AND DIFFERENTIAL
HCT: 31 — AB (ref 36–46)
Hemoglobin: 10.4 — AB (ref 12.0–16.0)
Neutrophils Absolute: 3.7
Platelets: 273 (ref 150–399)
WBC: 57

## 2020-01-22 LAB — HEPATIC FUNCTION PANEL
ALT: 17 (ref 7–35)
AST: 17 (ref 13–35)
Alkaline Phosphatase: 91 (ref 25–125)
Bilirubin, Total: 0.4

## 2020-01-22 LAB — CBC: RBC: 3.78 — AB (ref 3.87–5.11)

## 2020-01-23 LAB — BASIC METABOLIC PANEL
BUN: 54 — AB (ref 4–21)
CO2: 26 — AB (ref 13–22)
Chloride: 101 (ref 99–108)
Creatinine: 3.4 — AB (ref 0.5–1.1)
Glucose: 118
Potassium: 3.5 (ref 3.4–5.3)
Sodium: 137 (ref 137–147)

## 2020-01-23 LAB — CBC AND DIFFERENTIAL
HCT: 29 — AB (ref 36–46)
Hemoglobin: 9.5 — AB (ref 12.0–16.0)
Platelets: 279 (ref 150–399)
WBC: 6.4

## 2020-01-23 LAB — CBC: RBC: 3.49 — AB (ref 3.87–5.11)

## 2020-01-23 LAB — HEPATIC FUNCTION PANEL
ALT: 16 (ref 7–35)
AST: 15 (ref 13–35)
Alkaline Phosphatase: 81 (ref 25–125)
Bilirubin, Total: 0.3

## 2020-01-23 LAB — COMPREHENSIVE METABOLIC PANEL
Albumin: 3.3 — AB (ref 3.5–5.0)
Calcium: 8.5 — AB (ref 8.7–10.7)
GFR calc Af Amer: 20

## 2020-01-24 LAB — BASIC METABOLIC PANEL
BUN: 50 — AB (ref 4–21)
CO2: 28 — AB (ref 13–22)
Chloride: 97 — AB (ref 99–108)
Creatinine: 2.9 — AB (ref 0.5–1.1)
Glucose: 281
Potassium: 3.7 (ref 3.4–5.3)
Sodium: 131 — AB (ref 137–147)

## 2020-01-24 LAB — CBC AND DIFFERENTIAL
HCT: 27 — AB (ref 36–46)
Hemoglobin: 9.3 — AB (ref 12.0–16.0)
Platelets: 262 (ref 150–399)
WBC: 5.7

## 2020-01-24 LAB — CBC: RBC: 3.317 — AB (ref 3.87–5.11)

## 2020-01-24 LAB — COMPREHENSIVE METABOLIC PANEL
Calcium: 8.1 — AB (ref 8.7–10.7)
GFR calc Af Amer: 24

## 2020-01-25 ENCOUNTER — Telehealth: Payer: Self-pay

## 2020-01-25 LAB — COMPREHENSIVE METABOLIC PANEL
Calcium: 8.6 — AB (ref 8.7–10.7)
GFR calc Af Amer: 41

## 2020-01-25 LAB — BASIC METABOLIC PANEL
BUN: 42 — AB (ref 4–21)
CO2: 25 — AB (ref 13–22)
Chloride: 98 — AB (ref 99–108)
Creatinine: 1.9 — AB (ref 0.5–1.1)
Glucose: 171
Potassium: 3.4 (ref 3.4–5.3)
Sodium: 133 — AB (ref 137–147)

## 2020-01-25 LAB — CBC AND DIFFERENTIAL
HCT: 28 — AB (ref 36–46)
Hemoglobin: 9.5 — AB (ref 12.0–16.0)
Platelets: 276 (ref 150–399)
WBC: 4.9

## 2020-01-25 LAB — CBC: RBC: 3.36 — AB (ref 3.87–5.11)

## 2020-01-25 NOTE — Telephone Encounter (Signed)
I have spoken with patient in regard to cancellation/ no show policy.    I informed patient that we wanted to work with her and asked her to at least call the office as soon as it opens if she is ever unable to make an appointment.

## 2020-01-27 ENCOUNTER — Inpatient Hospital Stay: Payer: 59 | Admitting: Family Medicine

## 2020-01-28 ENCOUNTER — Other Ambulatory Visit: Payer: Self-pay | Admitting: Family Medicine

## 2020-01-28 ENCOUNTER — Other Ambulatory Visit: Payer: Self-pay | Admitting: Gastroenterology

## 2020-02-02 ENCOUNTER — Other Ambulatory Visit: Payer: Self-pay | Admitting: *Deleted

## 2020-02-02 ENCOUNTER — Encounter: Payer: Self-pay | Admitting: Family Medicine

## 2020-02-02 MED ORDER — CYCLOBENZAPRINE HCL 10 MG PO TABS: 10.0000 mg | ORAL_TABLET | Freq: Three times a day (TID) | ORAL | 0 refills | Status: DC | PRN

## 2020-02-02 NOTE — Telephone Encounter (Signed)
LAST APPOINTMENT DATE: 01/04/2019   NEXT APPOINTMENT DATE: 02/03/2020    LAST REFILL: 08/302021  QTY: 30

## 2020-02-02 NOTE — Patient Outreach (Signed)
[name] [facility]) Care Management  [birth year]  [name] [facility] [birth year] [phone]   Outreach attempt #6, unsuccessful, HIPAA compliant voice message left.  No response from member after multiple unsuccessful outreach attempts and letter sent.  Will close case at this time due to inability to maintain contact.  Will notify member and primary MD of case closure.  Valente David, [place], MSN [place] [phone]

## 2020-02-03 ENCOUNTER — Encounter: Payer: Self-pay | Admitting: Family Medicine

## 2020-02-03 ENCOUNTER — Ambulatory Visit (INDEPENDENT_AMBULATORY_CARE_PROVIDER_SITE_OTHER): Payer: 59 | Admitting: Family Medicine

## 2020-02-03 ENCOUNTER — Other Ambulatory Visit: Payer: Self-pay

## 2020-02-03 VITALS — BP 108/69 | HR 100 | Temp 98.3°F | Ht 67.0 in | Wt 150.4 lb

## 2020-02-03 DIAGNOSIS — K3184 Gastroparesis: Secondary | ICD-10-CM

## 2020-02-03 DIAGNOSIS — E1043 Type 1 diabetes mellitus with diabetic autonomic (poly)neuropathy: Secondary | ICD-10-CM

## 2020-02-03 DIAGNOSIS — N183 Chronic kidney disease, stage 3 unspecified: Secondary | ICD-10-CM | POA: Insufficient documentation

## 2020-02-03 DIAGNOSIS — E1059 Type 1 diabetes mellitus with other circulatory complications: Secondary | ICD-10-CM | POA: Diagnosis not present

## 2020-02-03 DIAGNOSIS — R9341 Abnormal radiologic findings on diagnostic imaging of renal pelvis, ureter, or bladder: Secondary | ICD-10-CM

## 2020-02-03 DIAGNOSIS — E1022 Type 1 diabetes mellitus with diabetic chronic kidney disease: Secondary | ICD-10-CM

## 2020-02-03 DIAGNOSIS — I152 Hypertension secondary to endocrine disorders: Secondary | ICD-10-CM

## 2020-02-03 DIAGNOSIS — E1069 Type 1 diabetes mellitus with other specified complication: Secondary | ICD-10-CM

## 2020-02-03 DIAGNOSIS — E1159 Type 2 diabetes mellitus with other circulatory complications: Secondary | ICD-10-CM

## 2020-02-03 DIAGNOSIS — F419 Anxiety disorder, unspecified: Secondary | ICD-10-CM

## 2020-02-03 MED ORDER — BUSPIRONE HCL 5 MG PO TABS: 5.0000 mg | ORAL_TABLET | Freq: Three times a day (TID) | ORAL | 5 refills | Status: AC

## 2020-02-03 NOTE — Assessment & Plan Note (Signed)
At goal.  Continue Coreg 6.25 mg twice daily, hydralazine 50 mg 3 times daily.

## 2020-02-03 NOTE — Assessment & Plan Note (Signed)
Stable.  Will be following up with GI later this month.  She will continue course of erythromycin and scopolamine patch.

## 2020-02-03 NOTE — Assessment & Plan Note (Signed)
Doing well with BuSpar.  We will continue 5 mg 3 times daily.  Also place referral for counseling per patient request.

## 2020-02-03 NOTE — Patient Instructions (Signed)
It was very nice to see you today!  I am glad that you are feeling better.  I will refill your anxiety medication.  Also place referral for you to see a therapist.  I will also place a referral for you to see a urologist.  Please see Dr. Havery Moros and Dr. [name] soon.  I will see you back in 6 months.  Please come back to see me sooner if needed.  Take care, Dr Jerline Pain  Please try these tips to maintain a healthy lifestyle:   Eat at least 3 REAL meals and 1-2 snacks per day.  Aim for no more than 5 hours between eating.  If you eat breakfast, please do so within one hour of getting up.    Each meal should contain half fruits/vegetables, one quarter protein, and one quarter carbs (no bigger than a computer mouse)   Cut down on sweet beverages. This includes juice, soda, and sweet tea.     Drink at least 1 glass of water with each meal and aim for at least 8 glasses per day   Exercise at least 150 minutes every week.

## 2020-02-03 NOTE — Assessment & Plan Note (Signed)
Reported sugars have been at goal.  She is due to see endocrinology.  Last A1c 8.3.

## 2020-02-03 NOTE — Progress Notes (Signed)
   [name] is a 29 y.o. female who presents today for an office visit.  Assessment/Plan:  New/Acute Problems: Abnormal bladder ultrasound Found to have bladder wall thickening/frond-like structure concerning for bladder wall mass.  Radiology recommended cystoscopy.  Will place referral to urology.   Chronic Problems Addressed Today: Diabetes mellitus type 1 ([place]) Reported sugars have been at goal.  She is due to see endocrinology.  Last A1c 8.3.  Hypertension associated with diabetes ([place]) At goal.  Continue Coreg 6.25 mg twice daily, hydralazine 50 mg 3 times daily.  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 Will be following up with them again in a few weeks.   Diabetic gastroparesis associated with type 1 diabetes mellitus ([facility]) Stable.  Will be following up with GI later this month.  She will continue course of erythromycin and scopolamine patch.  Anxiety Doing well with BuSpar.  We will continue 5 mg 3 times daily.  Also place referral for counseling per patient request.     Subjective:  HPI:  Patient here for hospitalization follow-up.  She has been admitted 2 times in the past month for diabetic gastroparesis flare.  Most recently was admitted on 01/22/2020.  She was also found to have AKI.  She was given IV antiemetics.  I was consulted and added on scopolamine patch.  Symptoms improved and she was discharged home with a course of erythromycin.  She has followed up with nephrology since being discharged.  Thought that her symptoms are mostly likely due to dehydration and possibly mild contrast-induced nephropathy.  She will be following up with nephrology again in few weeks.  Overall nausea vomiting has been much better controlled since being discharged from the hospital.  She still has occasional vomiting but it is much better than what happened a few weeks ago.  She was also seen by psychiatry during her latest hospitalization.  BuSpar was  added onto her medication regimen.  She is now taking 5 mg 3 times daily and seems to be doing well with this.  Her antihypertensive regimen was also changed.  Her Lopressor was changed to carvedilol.  She has done well with this change.       Objective:  Physical Exam: BP 108/69   Pulse 100   Temp 98.3 F (36.8 C) (Temporal)   Ht 5' 7"  (1.702 m)   Wt 150 lb 6.4 oz (68.2 kg)   SpO2 100%   BMI 23.56 kg/m   Gen: No acute distress, resting comfortably CV: Regular rate and rhythm with no murmurs appreciated Pulm: Normal work of breathing, clear to auscultation bilaterally with no crackles, wheezes, or rhonchi Neuro: Grossly normal, moves all extremities Psych: Normal affect and thought content  Time Spent: 55 minutes of total time was spent on the date of the encounter performing the following actions: chart review prior to seeing the patient including recent hospitalizations, obtaining history, performing a medically necessary exam, counseling on the treatment plan, placing orders, and documenting in our EHR.        Algis Greenhouse. Jerline Pain, MD 02/03/2020 4:08 PM

## 2020-02-03 NOTE — Assessment & Plan Note (Signed)
Seeing nephrology. Will be following up with them again in a few weeks.

## 2020-02-11 ENCOUNTER — Telehealth: Payer: Self-pay

## 2020-02-11 NOTE — Telephone Encounter (Signed)
PA for Pantoprazole 40 mg BID submitted via CoverMyMeds.

## 2020-02-13 NOTE — Telephone Encounter (Signed)
(  Key: [id number]) - [id number] Pantoprazole Sodium 40MG  dr tablets     Status: PA Response - Approved

## 2020-02-16 LAB — HEPATIC FUNCTION PANEL
ALT: 28 (ref 7–35)
AST: 23 (ref 13–35)
Alkaline Phosphatase: 104 (ref 25–125)
Bilirubin, Total: 0.4

## 2020-02-16 LAB — CBC AND DIFFERENTIAL
HCT: 30 — AB (ref 36–46)
Hemoglobin: 10.1 — AB (ref 12.0–16.0)
Neutrophils Absolute: 2.7
Platelets: 300 (ref 150–399)
WBC: 48

## 2020-02-16 LAB — COMPREHENSIVE METABOLIC PANEL
Albumin: 3.5 (ref 3.5–5.0)
Calcium: 8.7 (ref 8.7–10.7)
GFR calc Af Amer: 32

## 2020-02-16 LAB — BASIC METABOLIC PANEL
BUN: 47 — AB (ref 4–21)
CO2: 33 — AB (ref 13–22)
Chloride: 91 — AB (ref 99–108)
Creatinine: 2.2 — AB (ref 0.5–1.1)
Glucose: 408
Potassium: 3.4 (ref 3.4–5.3)
Sodium: 135 — AB (ref 137–147)

## 2020-02-16 LAB — CBC: RBC: 3.63 — AB (ref 3.87–5.11)

## 2020-02-17 LAB — BASIC METABOLIC PANEL
BUN: 45 — AB (ref 4–21)
CO2: 31 — AB (ref 13–22)
Chloride: 94 — AB (ref 99–108)
Creatinine: 2.2 — AB (ref 0.5–1.1)
Glucose: 185
Potassium: 32 — AB (ref 3.4–5.3)
Sodium: 134 — AB (ref 137–147)

## 2020-02-17 LAB — COMPREHENSIVE METABOLIC PANEL
Calcium: 8.5 — AB (ref 8.7–10.7)
GFR calc Af Amer: 34

## 2020-02-17 LAB — CBC: RBC: 3.21 — AB (ref 3.87–5.11)

## 2020-02-17 LAB — CBC AND DIFFERENTIAL
HCT: 26 — AB (ref 36–46)
Hemoglobin: 88 — AB (ref 12.0–16.0)
Platelets: 262 (ref 150–399)
WBC: 6.2

## 2020-02-20 ENCOUNTER — Other Ambulatory Visit: Payer: Self-pay

## 2020-02-20 ENCOUNTER — Emergency Department (HOSPITAL_COMMUNITY)
Admission: EM | Admit: 2020-02-20 | Discharge: 2020-02-21 | Disposition: A | Discharge status: Home / Self Care | Payer: 59 | Attending: Emergency Medicine | Admitting: Emergency Medicine

## 2020-02-20 ENCOUNTER — Encounter (HOSPITAL_COMMUNITY): Payer: Self-pay | Admitting: Emergency Medicine

## 2020-02-20 DIAGNOSIS — Z5321 Procedure and treatment not carried out due to patient leaving prior to being seen by health care provider: Secondary | ICD-10-CM | POA: Insufficient documentation

## 2020-02-20 DIAGNOSIS — M546 Pain in thoracic spine: Secondary | ICD-10-CM | POA: Diagnosis present

## 2020-02-20 DIAGNOSIS — R110 Nausea: Secondary | ICD-10-CM | POA: Insufficient documentation

## 2020-02-20 LAB — CBC WITH DIFFERENTIAL/PLATELET
Abs Immature Granulocytes: 0.06 K/uL (ref 0.00–0.07)
Basophils Absolute: 0.1 K/uL (ref 0.0–0.1)
Basophils Relative: 1 %
Eosinophils Absolute: 0.3 K/uL (ref 0.0–0.5)
Eosinophils Relative: 3 %
HCT: 29.3 % — ABNORMAL LOW (ref 36.0–46.0)
Hemoglobin: 9.3 g/dL — ABNORMAL LOW (ref 12.0–15.0)
Immature Granulocytes: 1 %
Lymphocytes Relative: 18 %
Lymphs Abs: 1.8 K/uL (ref 0.7–4.0)
MCH: 27.5 pg (ref 26.0–34.0)
MCHC: 31.7 g/dL (ref 30.0–36.0)
MCV: 86.7 fL (ref 80.0–100.0)
Monocytes Absolute: 0.5 K/uL (ref 0.1–1.0)
Monocytes Relative: 5 %
Neutro Abs: 7.1 K/uL (ref 1.7–7.7)
Neutrophils Relative %: 72 %
Platelets: 291 K/uL (ref 150–400)
RBC: 3.38 MIL/uL — ABNORMAL LOW (ref 3.87–5.11)
RDW: 12.8 % (ref 11.5–15.5)
WBC: 9.8 K/uL (ref 4.0–10.5)
nRBC: 0 % (ref 0.0–0.2)

## 2020-02-20 LAB — I-STAT BETA HCG BLOOD, ED (MC, WL, AP ONLY): I-stat hCG, quantitative: <5 m[IU]/mL (ref <5)

## 2020-02-20 MED ORDER — OXYCODONE-ACETAMINOPHEN 5-325 MG PO TABS
1.0000 | ORAL_TABLET | Freq: Once | ORAL | Status: AC
  Administered 2020-02-20: 1 via ORAL
  Filled 2020-02-20: qty 1

## 2020-02-20 NOTE — ED Triage Notes (Signed)
Patient reports mid back pain this morning , denies injury , no hematuria or dysuria .

## 2020-02-21 LAB — URINALYSIS, ROUTINE W REFLEX MICROSCOPIC
Bacteria, UA: NONE SEEN
Bilirubin Urine: NEGATIVE
Glucose, UA: >=500 mg/dL — AB
Ketones, ur: NEGATIVE mg/dL
Leukocytes,Ua: NEGATIVE
Nitrite: NEGATIVE
Protein, ur: >=300 mg/dL — AB
Specific Gravity, Urine: 1.015 (ref 1.005–1.030)
pH: 6 (ref 5.0–8.0)

## 2020-02-21 LAB — BASIC METABOLIC PANEL
Anion gap: 12 (ref 5–15)
BUN: 21 mg/dL — ABNORMAL HIGH (ref 6–20)
CO2: 27 mmol/L (ref 22–32)
Calcium: 9.1 mg/dL (ref 8.9–10.3)
Chloride: 97 mmol/L — ABNORMAL LOW (ref 98–111)
Creatinine, Ser: 1.17 mg/dL — ABNORMAL HIGH (ref 0.44–1.00)
GFR, Estimated: >60 mL/min (ref >60)
Glucose, Bld: 224 mg/dL — ABNORMAL HIGH (ref 70–99)
Potassium: 3.6 mmol/L (ref 3.5–5.1)
Sodium: 136 mmol/L (ref 135–145)

## 2020-02-21 NOTE — ED Notes (Signed)
Pt did not want to wait and left. 

## 2020-02-24 ENCOUNTER — Other Ambulatory Visit: Payer: Self-pay | Admitting: Endocrinology

## 2020-02-24 DIAGNOSIS — E1010 Type 1 diabetes mellitus with ketoacidosis without coma: Secondary | ICD-10-CM

## 2020-02-24 NOTE — Telephone Encounter (Signed)
1.  Please schedule f/u appt 2.  Then please refill x 2 mos, pending that appt.  

## 2020-02-27 ENCOUNTER — Telehealth: Payer: Self-pay | Admitting: Endocrinology

## 2020-02-27 ENCOUNTER — Other Ambulatory Visit: Payer: Self-pay

## 2020-02-27 DIAGNOSIS — E1010 Type 1 diabetes mellitus with ketoacidosis without coma: Secondary | ICD-10-CM

## 2020-02-27 MED ORDER — DEXCOM G6 SENSOR MISC: 1 refills | Status: DC

## 2020-02-27 NOTE — Telephone Encounter (Signed)
Can we please schedule 2 months.  Thank you!  Can you help me with that, while I do the refill? Thank you!

## 2020-02-27 NOTE — Telephone Encounter (Signed)
1.  Please schedule f/u appt 2.  Then please refill x 2 mos, pending that appt.  

## 2020-02-27 NOTE — Telephone Encounter (Signed)
Please advise 

## 2020-02-27 NOTE — Telephone Encounter (Signed)
Patient called re: Patient's PHARM told patient they are unable to fill the RX for Continuous Blood Gluc Sensor (DEXCOM G6 SENSOR) MISC that PHARM received this morning due to RX not being approved by Dr. Lonzo Candy. Patient requests to be called at ph# [phone] re: status of the above RX.

## 2020-02-28 NOTE — Telephone Encounter (Signed)
LVM and sent mychart to schedule follow up.

## 2020-02-28 NOTE — Telephone Encounter (Signed)
Noted. Thanks.

## 2020-02-29 ENCOUNTER — Other Ambulatory Visit: Payer: Self-pay

## 2020-02-29 ENCOUNTER — Encounter: Payer: Self-pay | Admitting: Endocrinology

## 2020-02-29 ENCOUNTER — Ambulatory Visit (INDEPENDENT_AMBULATORY_CARE_PROVIDER_SITE_OTHER): Payer: 59 | Admitting: Endocrinology

## 2020-02-29 VITALS — BP 118/62 | HR 94 | Ht 67.0 in | Wt 146.0 lb

## 2020-02-29 DIAGNOSIS — E1143 Type 2 diabetes mellitus with diabetic autonomic (poly)neuropathy: Secondary | ICD-10-CM

## 2020-02-29 DIAGNOSIS — E1010 Type 1 diabetes mellitus with ketoacidosis without coma: Secondary | ICD-10-CM

## 2020-02-29 LAB — POCT GLYCOSYLATED HEMOGLOBIN (HGB A1C): Hemoglobin A1C: 9 % — AB (ref 4.0–5.6)

## 2020-02-29 MED ORDER — LEVEMIR FLEXTOUCH 100 UNIT/ML ~~LOC~~ SOPN: 35.0000 [IU] | PEN_INJECTOR | SUBCUTANEOUS | 11 refills | Status: DC

## 2020-02-29 NOTE — Patient Instructions (Addendum)
Please change both current insulins to "Levemir," 35 units each morning.  I have sent a prescription to your pharmacy.   On this type of insulin schedule, you should eat meals on a regular schedule.  If a meal is missed or significantly delayed, your blood sugar could go low.   check your blood sugar twice a day.  vary the time of day when you check, between before the 3 meals, and at bedtime.  also check if you have symptoms of your blood sugar being too high or too low.  please keep a record of the readings and bring it to your next appointment here (or you can bring the meter itself).  You can write it on any piece of paper.  please call us sooner if your blood sugar goes below 70, or if you have a lot of readings over 200.   Please come back for a follow-up appointment in 2 weeks.

## 2020-02-29 NOTE — Progress Notes (Signed)
Subjective:    Patient ID: [name], female    DOB: [birth year], 29 y.o.   MRN: [id number]  HPI Pt returns for f/u of diabetes mellitus: DM type: 1 Dx'ed: 3009 Complications: gastroparesis Therapy: insulin since dx GDM: never DKA: last episode was in 2018 Severe hypoglycemia: last episode was in 2018.    Pancreatitis: never Pancreatic imaging: normal on 2019 CT.   Other: she takes multiple daily injections.   Interval history: I reviewed continuous glucose monitor data.   Glucose varies from 90-500.  It is in general highest from MN to 9AM, and lowest in then afternoon.  Pt says she sometimes misses the insulin.  no cbg record, but states cbg's are mildly low, approx twice per day.  This usually happens fasting.  Nausea persists.   Past Medical History:  Diagnosis Date  . Allergy   . Anemia   . Anxiety   . Blood transfusion without reported diagnosis    Dec 2018  . Cataract    right eye  . COVID-19   . Depression   . Diabetes type 1, uncontrolled ([place]) 11/14/2011   Since age 48  . Fibromyalgia   . Gastroparesis   . GERD (gastroesophageal reflux disease)   . Hypertension   . Infection    UTI April 2016    Past Surgical History:  Procedure Laterality Date  . ANKLE SURGERY    . CHOLECYSTECTOMY  11/15/2011   Procedure: LAPAROSCOPIC CHOLECYSTECTOMY WITH INTRAOPERATIVE CHOLANGIOGRAM;  Surgeon: [name], MD;  Location: [facility];  Service: General;  Laterality: N/A;  . COLONOSCOPY    . COLONOSCOPY WITH PROPOFOL N/A 06/27/2017   Procedure: COLONOSCOPY WITH PROPOFOL;  Surgeon: Milus Banister, MD;  Location: [facility];  Service: Endoscopy;  Laterality: N/A;  . ESOPHAGOGASTRODUODENOSCOPY  12/03/2011   Procedure: ESOPHAGOGASTRODUODENOSCOPY (EGD);  Surgeon: Beryle Beams, MD;  Location: [facility];  Service: Endoscopy;  Laterality: N/A;  . FLEXIBLE SIGMOIDOSCOPY N/A 03/10/2017   Procedure: FLEXIBLE SIGMOIDOSCOPY;  Surgeon: Carol Ada, MD;  Location: [facility];   Service: Endoscopy;  Laterality: N/A;  . INCISION AND DRAINAGE PERIRECTAL ABSCESS N/A 03/01/2017   Procedure: IRRIGATION AND DEBRIDEMENT PERIRECTAL ABSCESS;  Surgeon: Alphonsa Overall, MD;  Location: [facility];  Service: General;  Laterality: N/A;  . IRRIGATION AND DEBRIDEMENT BUTTOCKS N/A 03/23/2017   Procedure: IRRIGATION AND DEBRIDEMENT BUTTOCKS, SETON PLACEMENT;  Surgeon: [name], MD;  Location: [facility];  Service: General;  Laterality: N/A;  . LAPAROSCOPY  11/23/2011   Procedure: LAPAROSCOPY DIAGNOSTIC;  Surgeon: Edward Jolly, MD;  Location: [facility];  Service: General;  Laterality: N/A;  . SIGMOIDOSCOPY    . UPPER GASTROINTESTINAL ENDOSCOPY    . WISDOM TOOTH EXTRACTION      Social History   Socioeconomic History  . Marital status: Married    Spouse name: sergio  . Number of children: 0  . Years of education: college  . Highest education level: Not on file  Occupational History  . Occupation: [employer] call center    Employer: [employer]  Tobacco Use  . Smoking status: Never Smoker  . Smokeless tobacco: Never Used  Vaping Use  . Vaping Use: Never used  Substance and Sexual Activity  . Alcohol use: No    Alcohol/week: 0.0 standard drinks  . Drug use: Yes    Types: Marijuana  . Sexual activity: Not Currently    Partners: Male    Birth control/protection: None  Other Topics Concern  . Not on file  Social History Narrative  . Not on file   Social Determinants of Health   Financial Resource Strain:   . Difficulty of Paying Living Expenses: Not on file  Food Insecurity:   . Worried About Charity fundraiser in the Last Year: Not on file  . Ran Out of Food in the Last Year: Not on file  Transportation Needs:   . Lack of Transportation (Medical): Not on file  . Lack of Transportation (Non-Medical): Not on file  Physical Activity:   . Days of Exercise per Week: Not on file  . Minutes of Exercise per Session: Not on file  Stress:   . Feeling of Stress :  Not on file  Social Connections:   . Frequency of Communication with Friends and Family: Not on file  . Frequency of Social Gatherings with Friends and Family: Not on file  . Attends Religious Services: Not on file  . Active Member of Clubs or Organizations: Not on file  . Attends Archivist Meetings: Not on file  . Marital Status: Not on file  Intimate Partner Violence:   . Fear of Current or Ex-Partner: Not on file  . Emotionally Abused: Not on file  . Physically Abused: Not on file  . Sexually Abused: Not on file    Current Outpatient Medications on File Prior to Visit  Medication Sig Dispense Refill  . aluminum-magnesium hydroxide-simethicone (MAALOX) [phone] MG/5ML SUSP Take 30 mLs by mouth 3 (three) times daily as needed (indigestion).    . busPIRone (BUSPAR) 5 MG tablet Take 1 tablet (5 mg total) by mouth 3 (three) times daily. 90 tablet 5  . carvedilol (COREG) 6.25 MG tablet Take 6.25 mg by mouth 2 (two) times daily with a meal.    . Continuous Blood Gluc Sensor (DEXCOM G6 SENSOR) MISC USE AS DIRECTED WITH CONTINUOUS BLOOD GLUCOSE MONITOR. REPLACE EVERY 10 DAYS 3 each 1  . Continuous Blood Gluc Transmit (DEXCOM G6 TRANSMITTER) MISC USE AS DIRECTED TO  MONITOR  GLUCOSE  LEVELS 1 each 0  . cyclobenzaprine (FLEXERIL) 10 MG tablet Take 1 tablet (10 mg total) by mouth 3 (three) times daily as needed for muscle spasms. 30 tablet 0  . dicyclomine (BENTYL) 20 MG tablet Take 1 tablet (20 mg total) by mouth every 8 (eight) hours as needed for spasms. 15 tablet 0  . hydrALAZINE (APRESOLINE) 50 MG tablet TAKE 1 TABLET(50 MG) BY MOUTH EVERY 6 HOURS 120 tablet 0  . isosorbide mononitrate (IMDUR) 30 MG 24 hr tablet Take 30 mg by mouth daily.    Marland Kitchen loperamide (IMODIUM) 2 MG capsule Take 6 mg by mouth daily as needed for diarrhea or loose stools.     Marland Kitchen LORazepam (ATIVAN) 0.5 MG tablet TAKE 1 TABLET(0.5 MG) BY MOUTH TWICE DAILY AS NEEDED FOR ANXIETY 60 tablet 0  . metoCLOPramide  (REGLAN) 5 MG tablet Take 1 tablet (5 mg total) by mouth in the morning and at bedtime. Please schedule an Office visit for further refills. Thank you 60 tablet 1  . mirtazapine (REMERON) 30 MG tablet TAKE 1 TABLET BY MOUTH AT  BEDTIME (Patient taking differently: Take 30 mg by mouth at bedtime. ) 30 tablet 11  . ondansetron (ZOFRAN) 4 MG tablet Take 1 tablet (4 mg total) by mouth every 8 (eight) hours as needed for up to 20 doses for nausea or vomiting. 20 tablet 0  . pantoprazole (PROTONIX) 40 MG tablet Take 1 tablet (40 mg total) by mouth 2 (two)  times daily. 180 tablet 2  . scopolamine (TRANSDERM-SCOP) 1 MG/3DAYS Place 1 patch onto the skin every 3 (three) days.    . sucralfate (CARAFATE) 1 GM/10ML suspension Take 10 mLs (1 g total) by mouth every 6 (six) hours as needed. (Patient taking differently: Take 1 g by mouth every 6 (six) hours as needed (stomach). ) 420 mL 1  . [DISCONTINUED] potassium chloride SA (KLOR-CON) 20 MEQ tablet Take 1 tablet (20 mEq total) by mouth daily for 5 doses. 5 tablet 0   No current facility-administered medications on file prior to visit.    Allergies  Allergen Reactions  . Other Anaphylaxis    Reaction to [place] nuts   . Lactose Intolerance (Gi) Diarrhea    Family History  Problem Relation Age of Onset  . Diabetes Mother   . Hypertension Father   . Colon cancer Paternal Grandmother        pt thinks PGM was dx in her 46's  . Diabetes Paternal Grandmother   . Diabetes Maternal Grandmother   . Diabetes Maternal Grandfather   . Diabetes Paternal Grandfather   . Diabetes Other   . Esophageal cancer Neg Hx   . Liver cancer Neg Hx   . Pancreatic cancer Neg Hx   . Stomach cancer Neg Hx   . Rectal cancer Neg Hx     BP 118/62   Pulse 94   Ht 5' 7"  (1.702 m)   Wt 146 lb (66.2 kg)   SpO2 98%   BMI 22.87 kg/m    Review of Systems Denies LOC    Objective:   Physical Exam VITAL SIGNS:  See vs page GENERAL: no distress Pulses: dorsalis pedis  intact bilat.   MSK: no deformity of the feet CV: no leg edema Skin:  no ulcer on the feet.  normal color and temp on the feet.  Neuro: sensation is intact to touch on the feet  A1c=9.0%  Lab Results  Component Value Date   CREATININE 1.17 (H) 02/20/2020   BUN 21 (H) 02/20/2020   NA 136 02/20/2020   K 3.6 02/20/2020   CL 97 (L) 02/20/2020   CO2 27 02/20/2020      Assessment & Plan:  Type 1 DM, with GP: uncontrolled Hypoglycemia, due to insulin: this limits aggressiveness of glycemic control Noncompliance with insulin.  She should change to QD insulin  Patient Instructions  Please change both current insulins to "Levemir," 35 units each morning.  I have sent a prescription to your pharmacy.   On this type of insulin schedule, you should eat meals on a regular schedule.  If a meal is missed or significantly delayed, your blood sugar could go low.   check your blood sugar twice a day.  vary the time of day when you check, between before the 3 meals, and at bedtime.  also check if you have symptoms of your blood sugar being too high or too low.  please keep a record of the readings and bring it to your next appointment here (or you can bring the meter itself).  You can write it on any piece of paper.  please call us sooner if your blood sugar goes below 70, or if you have a lot of readings over 200.   Please come back for a follow-up appointment in 2 weeks.

## 2020-03-01 ENCOUNTER — Telehealth: Payer: Self-pay | Admitting: Endocrinology

## 2020-03-01 ENCOUNTER — Encounter: Payer: Self-pay | Admitting: Endocrinology

## 2020-03-01 MED ORDER — NOVOLIN N FLEXPEN 100 UNIT/ML ~~LOC~~ SUPN: 20.0000 [IU] | PEN_INJECTOR | SUBCUTANEOUS | 11 refills | Status: DC

## 2020-03-01 NOTE — Telephone Encounter (Signed)
Please advise. Thank you

## 2020-03-01 NOTE — Telephone Encounter (Signed)
I have sent a prescription to your pharmacy, to change to NPH, just 20 units qam On this type of insulin schedule, you should eat meals on a regular schedule (especially lunch).  If a meal is missed or significantly delayed, your blood sugar could go low. I'll see you next time.

## 2020-03-01 NOTE — Telephone Encounter (Signed)
Sent via my chart message.

## 2020-03-01 NOTE — Telephone Encounter (Signed)
Patient states her insurance does not cover Novolin either - she said they did not provide any other alternatives that they do cover and patient is unsure what to do. She asked for a call back to discuss at [phone]

## 2020-03-01 NOTE — Telephone Encounter (Signed)
Pt called states LEVIMIR FLEXTOUCH NONCOVEERED by insurance.  Please sent something different to [place].

## 2020-03-02 ENCOUNTER — Other Ambulatory Visit: Payer: Self-pay

## 2020-03-02 MED ORDER — PROMETHAZINE HCL 12.5 MG PO TABS
ORAL_TABLET | ORAL | 0 refills | Status: DC
Start: 2020-03-02 — End: 2020-03-20

## 2020-03-02 MED ORDER — HUMULIN N KWIKPEN 100 UNIT/ML ~~LOC~~ SUPN: 20.0000 [IU] | PEN_INJECTOR | SUBCUTANEOUS | 11 refills | Status: DC

## 2020-03-05 ENCOUNTER — Telehealth: Payer: Self-pay | Admitting: *Deleted

## 2020-03-05 NOTE — Telephone Encounter (Signed)
Any NPH is fine

## 2020-03-05 NOTE — Telephone Encounter (Signed)
Patient called stating the "novalen" came back that it is not covered by insurance.

## 2020-03-05 NOTE — Telephone Encounter (Signed)
Please see below.

## 2020-03-06 ENCOUNTER — Other Ambulatory Visit: Payer: Self-pay | Admitting: Family Medicine

## 2020-03-06 NOTE — Telephone Encounter (Signed)
LAST APPOINTMENT DATE: 02/03/2020   NEXT APPOINTMENT DATE: Visit date not found   Rx Lorazepam  LAST REFILL: 01/23/2020  QTY: 60  REF:0  Rx Flexeril LAST REFILL: 02/02/2020 QTY:    30     REF: 0

## 2020-03-06 NOTE — Telephone Encounter (Signed)
Sent mychart message- advising patient to [employer] and ask them which they cover and prefer- and let us know.

## 2020-03-07 ENCOUNTER — Encounter: Payer: Self-pay | Admitting: *Deleted

## 2020-03-07 ENCOUNTER — Encounter: Payer: Self-pay | Admitting: Family Medicine

## 2020-03-13 ENCOUNTER — Emergency Department (HOSPITAL_COMMUNITY)
Admission: EM | Admit: 2020-03-13 | Discharge: 2020-03-14 | Disposition: A | Discharge status: Home / Self Care | Payer: 59 | Source: Home / Self Care | Attending: Emergency Medicine | Admitting: Emergency Medicine

## 2020-03-13 ENCOUNTER — Encounter (HOSPITAL_COMMUNITY): Payer: Self-pay

## 2020-03-13 ENCOUNTER — Emergency Department (HOSPITAL_COMMUNITY)
Admission: EM | Admit: 2020-03-13 | Discharge: 2020-03-13 | Disposition: A | Discharge status: Home / Self Care | Payer: 59 | Attending: Emergency Medicine | Admitting: Emergency Medicine

## 2020-03-13 ENCOUNTER — Other Ambulatory Visit: Payer: Self-pay

## 2020-03-13 DIAGNOSIS — I129 Hypertensive chronic kidney disease with stage 1 through stage 4 chronic kidney disease, or unspecified chronic kidney disease: Secondary | ICD-10-CM | POA: Insufficient documentation

## 2020-03-13 DIAGNOSIS — Z5321 Procedure and treatment not carried out due to patient leaving prior to being seen by health care provider: Secondary | ICD-10-CM | POA: Diagnosis not present

## 2020-03-13 DIAGNOSIS — R531 Weakness: Secondary | ICD-10-CM | POA: Insufficient documentation

## 2020-03-13 DIAGNOSIS — E162 Hypoglycemia, unspecified: Secondary | ICD-10-CM | POA: Diagnosis present

## 2020-03-13 DIAGNOSIS — N183 Chronic kidney disease, stage 3 unspecified: Secondary | ICD-10-CM | POA: Insufficient documentation

## 2020-03-13 DIAGNOSIS — Z8616 Personal history of COVID-19: Secondary | ICD-10-CM | POA: Insufficient documentation

## 2020-03-13 DIAGNOSIS — E1022 Type 1 diabetes mellitus with diabetic chronic kidney disease: Secondary | ICD-10-CM | POA: Insufficient documentation

## 2020-03-13 DIAGNOSIS — R42 Dizziness and giddiness: Secondary | ICD-10-CM | POA: Diagnosis not present

## 2020-03-13 DIAGNOSIS — Z79899 Other long term (current) drug therapy: Secondary | ICD-10-CM | POA: Insufficient documentation

## 2020-03-13 DIAGNOSIS — Z794 Long term (current) use of insulin: Secondary | ICD-10-CM | POA: Insufficient documentation

## 2020-03-13 DIAGNOSIS — E10649 Type 1 diabetes mellitus with hypoglycemia without coma: Secondary | ICD-10-CM | POA: Insufficient documentation

## 2020-03-13 LAB — CBG MONITORING, ED
Glucose-Capillary: 74 mg/dL (ref 70–99)
Glucose-Capillary: 90 mg/dL (ref 70–99)

## 2020-03-13 LAB — CBC WITH DIFFERENTIAL/PLATELET
Abs Immature Granulocytes: 0.02 10*3/uL (ref 0.00–0.07)
Basophils Absolute: 0 10*3/uL (ref 0.0–0.1)
Basophils Relative: 1 %
Eosinophils Absolute: 0.3 10*3/uL (ref 0.0–0.5)
Eosinophils Relative: 7 %
HCT: 26.4 % — ABNORMAL LOW (ref 36.0–46.0)
Hemoglobin: 8.3 g/dL — ABNORMAL LOW (ref 12.0–15.0)
Immature Granulocytes: 0 %
Lymphocytes Relative: 34 %
Lymphs Abs: 1.6 10*3/uL (ref 0.7–4.0)
MCH: 27.5 pg (ref 26.0–34.0)
MCHC: 31.4 g/dL (ref 30.0–36.0)
MCV: 87.4 fL (ref 80.0–100.0)
Monocytes Absolute: 0.4 10*3/uL (ref 0.1–1.0)
Monocytes Relative: 9 %
Neutro Abs: 2.3 10*3/uL (ref 1.7–7.7)
Neutrophils Relative %: 49 %
Platelets: 222 10*3/uL (ref 150–400)
RBC: 3.02 MIL/uL — ABNORMAL LOW (ref 3.87–5.11)
RDW: 13.2 % (ref 11.5–15.5)
WBC: 4.8 10*3/uL (ref 4.0–10.5)
nRBC: 0 % (ref 0.0–0.2)

## 2020-03-13 LAB — COMPREHENSIVE METABOLIC PANEL
ALT: 24 U/L (ref 0–44)
AST: 30 U/L (ref 15–41)
Albumin: 3.1 g/dL — ABNORMAL LOW (ref 3.5–5.0)
Alkaline Phosphatase: 79 U/L (ref 38–126)
Anion gap: 8 (ref 5–15)
BUN: 34 mg/dL — ABNORMAL HIGH (ref 6–20)
CO2: 25 mmol/L (ref 22–32)
Calcium: 8.7 mg/dL — ABNORMAL LOW (ref 8.9–10.3)
Chloride: 102 mmol/L (ref 98–111)
Creatinine, Ser: 1.29 mg/dL — ABNORMAL HIGH (ref 0.44–1.00)
GFR, Estimated: 57 mL/min — ABNORMAL LOW (ref >60)
Glucose, Bld: 156 mg/dL — ABNORMAL HIGH (ref 70–99)
Potassium: 3.3 mmol/L — ABNORMAL LOW (ref 3.5–5.1)
Sodium: 135 mmol/L (ref 135–145)
Total Bilirubin: 0.4 mg/dL (ref 0.3–1.2)
Total Protein: 7.4 g/dL (ref 6.5–8.1)

## 2020-03-13 LAB — LIPASE, BLOOD: Lipase: 17 U/L (ref 11–51)

## 2020-03-13 LAB — ETHANOL: Alcohol, Ethyl (B): <10 mg/dL (ref <10)

## 2020-03-13 MED ORDER — SODIUM CHLORIDE 0.9 % IV BOLUS
1000.0000 mL | Freq: Once | INTRAVENOUS | Status: AC
  Administered 2020-03-13: 1000 mL via INTRAVENOUS

## 2020-03-13 MED ORDER — GLUCOSE 40 % PO GEL
ORAL | Status: AC
  Filled 2020-03-13: qty 1

## 2020-03-13 MED ORDER — GLUCOSE 4 G PO CHEW
CHEWABLE_TABLET | ORAL | Status: AC
  Filled 2020-03-13: qty 1

## 2020-03-13 MED ORDER — DEXTROSE 50 % IV SOLN
INTRAVENOUS | Status: AC
  Filled 2020-03-13: qty 100

## 2020-03-13 MED ORDER — GLUCAGON HCL RDNA (DIAGNOSTIC) 1 MG IJ SOLR
INTRAMUSCULAR | Status: AC
  Filled 2020-03-13: qty 1

## 2020-03-13 NOTE — ED Triage Notes (Signed)
Pt was here earlier and left shortly after triage, pt was brought back by her husband with a blood sugar of 19

## 2020-03-13 NOTE — ED Provider Notes (Signed)
[facility] DEPT Provider Note   CSN: [id number] Arrival date & time: 03/13/20  2213     History No chief complaint on file.   [name] is a 29 y.o. female.  HPI Patient presents with concern of weakness.    Patient has multiple medical issues, but states that until the past 2 days or so she was in her usual state of health. Now, over the past days that she has had nausea, anorexia, multiple episodes of vomiting. In spite of this patient has been taking her medication regularly, including her insulin. She had recent change of her insulin dosing, but now within the past 48 hours. Today, just prior to ED arrival patient was walking in a store, when she felt weak.  No complete syncope, no fall no pain, chest or abdominal. With concern for weakness she was brought here for evaluation. Nursing staff notes that the patient was weak on arrival, found to have a glucose of 19, provided dextrose, 50, x2 and brought to the resuscitation bay.  Patient notes that she feels somewhat better after that intervention.  Chart review notable for multiple recent visits, and I have seen the patient 6 times in the past.   Past Medical History:  Diagnosis Date  . Allergy   . Anemia   . Anxiety   . Blood transfusion without reported diagnosis    Dec 2018  . Cataract    right eye  . COVID-19   . Depression   . Diabetes type 1, uncontrolled ([place]) 11/14/2011   Since age 89  . Fibromyalgia   . Gastroparesis   . GERD (gastroesophageal reflux disease)   . Hypertension   . Infection    UTI April 2016    Patient Active Problem List   Diagnosis Date Noted  . 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 Hypertensive urgency 04/30/2019  . Cannabinoid hyperemesis syndrome 04/30/2019  . Contraception management 08/06/2018  . Cyclical vomiting syndrome 06/07/2018  . Diabetic gastroparesis associated with type 1 diabetes mellitus ([place])  06/04/2018  . Anxiety 05/31/2018  . Peripheral edema 05/31/2018  . Normocytic anemia 07/10/2017  . GERD (gastroesophageal reflux disease) 06/23/2017  . Intractable nausea and vomiting 04/11/2017  . MDD (major depressive disorder), recurrent episode, mild ([place]) 02/28/2017  . Diabetes mellitus type 1 ([place]) 04/28/2013  . Protein-calorie malnutrition, severe ([place]) 04/28/2013  . Hypertension associated with diabetes ([place]) 11/14/2011  . Chronic abdominal pain 09/18/2011    Past Surgical History:  Procedure Laterality Date  . ANKLE SURGERY    . CHOLECYSTECTOMY  11/15/2011   Procedure: LAPAROSCOPIC CHOLECYSTECTOMY WITH INTRAOPERATIVE CHOLANGIOGRAM;  Surgeon: [name], MD;  Location: [facility];  Service: General;  Laterality: N/A;  . COLONOSCOPY    . COLONOSCOPY WITH PROPOFOL N/A 06/27/2017   Procedure: COLONOSCOPY WITH PROPOFOL;  Surgeon: Milus Banister, MD;  Location: [facility];  Service: Endoscopy;  Laterality: N/A;  . ESOPHAGOGASTRODUODENOSCOPY  12/03/2011   Procedure: ESOPHAGOGASTRODUODENOSCOPY (EGD);  Surgeon: Beryle Beams, MD;  Location: [facility];  Service: Endoscopy;  Laterality: N/A;  . FLEXIBLE SIGMOIDOSCOPY N/A 03/10/2017   Procedure: FLEXIBLE SIGMOIDOSCOPY;  Surgeon: Carol Ada, MD;  [facility];  Service: Endoscopy;  Laterality: N/A;  . INCISION AND DRAINAGE PERIRECTAL ABSCESS N/A 03/01/2017   Procedure: IRRIGATION AND DEBRIDEMENT PERIRECTAL ABSCESS;  Surgeon: Alphonsa Overall, MD;  Location: [facility];  Service: General;  Laterality: N/A;  . IRRIGATION AND DEBRIDEMENT BUTTOCKS N/A 03/23/2017  Procedure: IRRIGATION AND DEBRIDEMENT BUTTOCKS, SETON PLACEMENT;  Surgeon: [name], MD;  Location: [facility];  Service: General;  Laterality: N/A;  . LAPAROSCOPY  11/23/2011   Procedure: LAPAROSCOPY DIAGNOSTIC;  Surgeon: Edward Jolly, MD;  [facility]: [facility];  Service: General;  Laterality: N/A;  . SIGMOIDOSCOPY    . UPPER GASTROINTESTINAL ENDOSCOPY    . WISDOM  TOOTH EXTRACTION       OB History    Gravida  1   Para  1   Term      Preterm  1   AB      Living  1     SAB      IAB      Ectopic      Multiple  0   Live Births  1           Family History  Problem Relation Age of Onset  . Diabetes Mother   . Hypertension Father   . Colon cancer Paternal Grandmother        pt thinks PGM was dx in her 88's  . Diabetes Paternal Grandmother   . Diabetes Maternal Grandmother   . Diabetes Maternal Grandfather   . Diabetes Paternal Grandfather   . Diabetes Other   . Breast cancer Paternal Aunt   . Esophageal cancer Neg Hx   . Liver cancer Neg Hx   . Pancreatic cancer Neg Hx   . Stomach cancer Neg Hx   . Rectal cancer Neg Hx     Social History   Tobacco Use  . Smoking status: Never Smoker  . Smokeless tobacco: Never Used  Vaping Use  . Vaping Use: Never used  Substance Use Topics  . Alcohol use: No    Alcohol/week: 0.0 standard drinks  . Drug use: Yes    Types: Marijuana    Home Medications Prior to Admission medications   Medication Sig Start Date End Date Taking? Authorizing Provider  aluminum-magnesium hydroxide-simethicone (MAALOX) [phone] MG/5ML SUSP Take 30 mLs by mouth 3 (three) times daily as needed (indigestion).    [provider]  busPIRone (BUSPAR) 5 MG tablet Take 1 tablet (5 mg total) by mouth 3 (three) times daily. 02/03/20   [name], MD  carvedilol (COREG) 6.25 MG tablet Take 6.25 mg by mouth 2 (two) times daily with a meal.    [provider]  Continuous Blood Gluc Sensor (DEXCOM G6 SENSOR) MISC USE AS DIRECTED WITH CONTINUOUS BLOOD GLUCOSE MONITOR. REPLACE EVERY 10 DAYS 02/27/20   Renato Shin, MD  Continuous Blood Gluc Transmit (DEXCOM G6 TRANSMITTER) MISC USE AS DIRECTED TO  MONITOR  GLUCOSE  LEVELS 01/19/20   Renato Shin, MD  cyclobenzaprine (FLEXERIL) 10 MG tablet TAKE 1 TABLET(10 MG) BY MOUTH THREE TIMES DAILY AS NEEDED FOR MUSCLE SPASMS 03/07/20   [name],  MD  dicyclomine (BENTYL) 20 MG tablet Take 1 tablet (20 mg total) by mouth every 8 (eight) hours as needed for spasms. 11/25/19   Petrucelli, Samantha R, PA-C  hydrALAZINE (APRESOLINE) 50 MG tablet TAKE 1 TABLET(50 MG) BY MOUTH EVERY 6 HOURS 01/30/20   [name], MD  Insulin NPH, Human,, Isophane, (HUMULIN N KWIKPEN) 100 UNIT/ML Kiwkpen Inject 20 Units into the skin every morning. 03/02/20   Renato Shin, MD  isosorbide mononitrate (IMDUR) 30 MG 24 hr tablet Take 30 mg by mouth daily. 01/03/20   [provider]  loperamide (IMODIUM) 2 MG capsule Take 6 mg by mouth daily as needed for  diarrhea or loose stools.     [provider]  LORazepam (ATIVAN) 0.5 MG tablet TAKE 1 TABLET(0.5 MG) BY MOUTH TWICE DAILY AS NEEDED FOR ANXIETY 03/07/20   [name], MD  metoCLOPramide (REGLAN) 5 MG tablet Take 1 tablet (5 mg total) by mouth in the morning and at bedtime. Please schedule an Office visit for further refills. Thank you 01/30/20   Armbruster, Carlota Raspberry, MD  mirtazapine (REMERON) 30 MG tablet TAKE 1 TABLET BY MOUTH AT  BEDTIME Patient taking differently: Take 30 mg by mouth at bedtime.  01/17/20   [name], MD  ondansetron (ZOFRAN) 4 MG tablet Take 1 tablet (4 mg total) by mouth every 8 (eight) hours as needed for up to 20 doses for nausea or vomiting. 11/23/19   Curatolo, Adam, DO  pantoprazole (PROTONIX) 40 MG tablet Take 1 tablet (40 mg total) by mouth 2 (two) times daily. 12/06/19   Armbruster, Carlota Raspberry, MD  promethazine (PHENERGAN) 12.5 MG tablet TAKE 1 TABLET(12.5 MG) BY MOUTH EVERY 8 HOURS AS NEEDED FOR NAUSEA OR VOMITING 03/02/20   Armbruster, Carlota Raspberry, MD  scopolamine (TRANSDERM-SCOP) 1 MG/3DAYS Place 1 patch onto the skin every 3 (three) days.    [provider]  sucralfate (CARAFATE) 1 GM/10ML suspension Take 10 mLs (1 g total) by mouth every 6 (six) hours as needed. Patient taking differently: Take 1 g by mouth every 6 (six) hours as needed (stomach).  11/24/19    Armbruster, Carlota Raspberry, MD  potassium chloride SA (KLOR-CON) 20 MEQ tablet Take 1 tablet (20 mEq total) by mouth daily for 5 doses. 07/04/19 07/30/19  Janeece Fitting, PA-C    Allergies    Other and Lactose intolerance (gi)  Review of Systems   Review of Systems  Constitutional:       Per HPI, otherwise negative  HENT:       Per HPI, otherwise negative  Respiratory:       Per HPI, otherwise negative  Cardiovascular:       Per HPI, otherwise negative  Gastrointestinal: Positive for nausea and vomiting. Negative for abdominal pain.  Endocrine:       Negative aside from HPI  Genitourinary:       Neg aside from HPI   Musculoskeletal:       Per HPI, otherwise negative  Skin: Negative.   Neurological: Positive for weakness and light-headedness. Negative for syncope.    Physical Exam Updated Vital Signs BP (!) 218/135   Pulse 84   Resp 17   SpO2 100%   Physical Exam Vitals and nursing note reviewed.  Constitutional:      Appearance: She is well-developed and well-nourished.     Comments: Deconditioned appearing adult female withdrawn, but interactive when spoken to directly.  HENT:     Head: Normocephalic and atraumatic.  Eyes:     Extraocular Movements: EOM normal.     Conjunctiva/sclera: Conjunctivae normal.  Cardiovascular:     Rate and Rhythm: Normal rate and regular rhythm.  Pulmonary:     Effort: Pulmonary effort is normal. No respiratory distress.     Breath sounds: Normal breath sounds. No stridor.  Abdominal:     General: There is no distension.     Tenderness: There is no abdominal tenderness. There is no guarding.  Musculoskeletal:        General: No edema.  Skin:    General: Skin is warm and dry.  Neurological:     General: No focal deficit  present.     Mental Status: She is oriented to person, place, and time.     Cranial Nerves: No cranial nerve deficit.     Motor: Atrophy present.  Psychiatric:        Mood and Affect: Mood and affect normal.         Behavior: Behavior is slowed and withdrawn.     ED Results / Procedures / Treatments   Labs (all labs ordered are listed, but only abnormal results are displayed) Labs Reviewed  COMPREHENSIVE METABOLIC PANEL - Abnormal; Notable for the following components:      Result Value   Potassium 3.3 (*)    Glucose, Bld 156 (*)    BUN 34 (*)    Creatinine, Ser 1.29 (*)    Calcium 8.7 (*)    Albumin 3.1 (*)    GFR, Estimated 57 (*)    All other components within normal limits  CBC WITH DIFFERENTIAL/PLATELET - Abnormal; Notable for the following components:   RBC 3.02 (*)    Hemoglobin 8.3 (*)    HCT 26.4 (*)    All other components within normal limits  ETHANOL  LIPASE, BLOOD  URINALYSIS, ROUTINE W REFLEX MICROSCOPIC  CBG MONITORING, ED    On cardiac monitor patient has heart rate 80s, sinus, unremarkable. Pulse oximetry 99% room air normal  Radiology No results found.  Procedures Procedures (including critical care time)  Medications Ordered in ED Medications  dextrose (GLUTOSE) 40 % oral gel (  Given 03/13/20 2220)  sodium chloride 0.9 % bolus 1,000 mL (1,000 mLs Intravenous New Bag/Given 03/13/20 2329)    ED Course  I have reviewed the triage vital signs and the nursing notes.  Pertinent labs & imaging results that were available during my care of the patient were reviewed by me and considered in my medical decision making (see chart for details).     With consideration of the patient's hyperglycemia, differential including iatrogenic, infectious, metabolic considered.  Patient placed on continuous monitor, cardiac, pulse oximetry, labs, x-ray ordered.  MDM Rules/Calculators/A&P                          11:41 PM Patient regular sitting upright speaking clearly. We reviewed findings, including CHEM panel notable for glucose 156, and she has had no decompensation over her time in the ED.  She has received fluid resuscitation. She had a discussed possibilities for  episode of hypoglycemia including ongoing nausea, vomiting, consistent with her history of gastroparesis, and echogenic hypoglycemia. Here with no fever, no dysuria, no cough, no shortness of breath, no hypoxia, low suspicion for occult infection processes. Patient's labs generally reassuring, patient states that she is comfortable with going home, will follow-up with her endocrinologist/primary care physician tomorrow, will not take insulin until after eating breakfast.  Final Clinical Impression(s) / ED Diagnoses Final diagnoses:  Hypoglycemia     Carmin Muskrat, MD 03/13/20 2343

## 2020-03-13 NOTE — Discharge Instructions (Addendum)
As discussed, your evaluation today has been largely reassuring.  But, it is important that you monitor your condition carefully, and do not hesitate to return to the ED if you develop new, or concerning changes in your condition.  Please do not take your insulin tomorrow until your nausea and vomiting have improved and you are able to tolerate breakfast.  Otherwise, please follow-up with your physician for appropriate ongoing care.

## 2020-03-13 NOTE — ED Triage Notes (Signed)
Patient arrives from home with complaint of feeling as though her blood sugar is low. She reports weakness, lightheadedness, and feeling flushed since around 2030. Denies any other symptoms of complaints. No medications taken.

## 2020-03-13 NOTE — ED Notes (Signed)
Patient returned to ED lethargic, husband states she just left ED with a BS in the 70s and did not want to be seen. CBG taken upon arrival and reading 19.

## 2020-03-14 ENCOUNTER — Ambulatory Visit (INDEPENDENT_AMBULATORY_CARE_PROVIDER_SITE_OTHER): Payer: 59 | Admitting: Endocrinology

## 2020-03-14 DIAGNOSIS — E1069 Type 1 diabetes mellitus with other specified complication: Secondary | ICD-10-CM

## 2020-03-14 NOTE — ED Notes (Signed)
Pt found with fsbs of 19, pt unresponsive with drool coming from her mouth. Pt was given d 34. Pt starting to become more alert

## 2020-03-17 NOTE — Progress Notes (Signed)
No show

## 2020-03-19 ENCOUNTER — Other Ambulatory Visit: Payer: Self-pay

## 2020-03-19 ENCOUNTER — Emergency Department (HOSPITAL_COMMUNITY)
Admission: EM | Admit: 2020-03-19 | Discharge: 2020-03-20 | Disposition: A | Discharge status: Home / Self Care | Payer: 59 | Source: Home / Self Care | Attending: Emergency Medicine | Admitting: Emergency Medicine

## 2020-03-19 ENCOUNTER — Encounter (HOSPITAL_COMMUNITY): Payer: Self-pay

## 2020-03-19 DIAGNOSIS — R112 Nausea with vomiting, unspecified: Secondary | ICD-10-CM

## 2020-03-19 DIAGNOSIS — Z794 Long term (current) use of insulin: Secondary | ICD-10-CM | POA: Insufficient documentation

## 2020-03-19 DIAGNOSIS — Z8616 Personal history of COVID-19: Secondary | ICD-10-CM | POA: Insufficient documentation

## 2020-03-19 DIAGNOSIS — E1010 Type 1 diabetes mellitus with ketoacidosis without coma: Secondary | ICD-10-CM | POA: Diagnosis not present

## 2020-03-19 DIAGNOSIS — I129 Hypertensive chronic kidney disease with stage 1 through stage 4 chronic kidney disease, or unspecified chronic kidney disease: Secondary | ICD-10-CM | POA: Insufficient documentation

## 2020-03-19 DIAGNOSIS — E1022 Type 1 diabetes mellitus with diabetic chronic kidney disease: Secondary | ICD-10-CM | POA: Insufficient documentation

## 2020-03-19 DIAGNOSIS — M546 Pain in thoracic spine: Secondary | ICD-10-CM | POA: Insufficient documentation

## 2020-03-19 DIAGNOSIS — Z79899 Other long term (current) drug therapy: Secondary | ICD-10-CM | POA: Insufficient documentation

## 2020-03-19 DIAGNOSIS — N183 Chronic kidney disease, stage 3 unspecified: Secondary | ICD-10-CM | POA: Insufficient documentation

## 2020-03-19 DIAGNOSIS — E1043 Type 1 diabetes mellitus with diabetic autonomic (poly)neuropathy: Secondary | ICD-10-CM | POA: Insufficient documentation

## 2020-03-19 LAB — CBC
HCT: 32.5 % — ABNORMAL LOW (ref 36.0–46.0)
Hemoglobin: 10.4 g/dL — ABNORMAL LOW (ref 12.0–15.0)
MCH: 27.9 pg (ref 26.0–34.0)
MCHC: 32 g/dL (ref 30.0–36.0)
MCV: 87.1 fL (ref 80.0–100.0)
Platelets: 233 10*3/uL (ref 150–400)
RBC: 3.73 MIL/uL — ABNORMAL LOW (ref 3.87–5.11)
RDW: 12.5 % (ref 11.5–15.5)
WBC: 5.7 10*3/uL (ref 4.0–10.5)
nRBC: 0 % (ref 0.0–0.2)

## 2020-03-19 LAB — COMPREHENSIVE METABOLIC PANEL
ALT: 23 U/L (ref 0–44)
AST: 17 U/L (ref 15–41)
Albumin: 3.3 g/dL — ABNORMAL LOW (ref 3.5–5.0)
Alkaline Phosphatase: 100 U/L (ref 38–126)
Anion gap: 14 (ref 5–15)
BUN: 17 mg/dL (ref 6–20)
CO2: 23 mmol/L (ref 22–32)
Calcium: 9.5 mg/dL (ref 8.9–10.3)
Chloride: 99 mmol/L (ref 98–111)
Creatinine, Ser: 1.25 mg/dL — ABNORMAL HIGH (ref 0.44–1.00)
GFR, Estimated: 59 mL/min — ABNORMAL LOW (ref >60)
Glucose, Bld: 305 mg/dL — ABNORMAL HIGH (ref 70–99)
Potassium: 4.1 mmol/L (ref 3.5–5.1)
Sodium: 136 mmol/L (ref 135–145)
Total Bilirubin: 0.9 mg/dL (ref 0.3–1.2)
Total Protein: 8.3 g/dL — ABNORMAL HIGH (ref 6.5–8.1)

## 2020-03-19 LAB — URINALYSIS, ROUTINE W REFLEX MICROSCOPIC
Bilirubin Urine: NEGATIVE
Glucose, UA: >=500 mg/dL — AB
Ketones, ur: 20 mg/dL — AB
Leukocytes,Ua: NEGATIVE
Nitrite: NEGATIVE
Protein, ur: >=300 mg/dL — AB
RBC / HPF: >50 RBC/hpf — ABNORMAL HIGH (ref 0–5)
Specific Gravity, Urine: 1.017 (ref 1.005–1.030)
pH: 6 (ref 5.0–8.0)

## 2020-03-19 LAB — I-STAT BETA HCG BLOOD, ED (MC, WL, AP ONLY): I-stat hCG, quantitative: <5 m[IU]/mL (ref <5)

## 2020-03-19 LAB — LIPASE, BLOOD: Lipase: 16 U/L (ref 11–51)

## 2020-03-19 LAB — CBG MONITORING, ED: Glucose-Capillary: 281 mg/dL — ABNORMAL HIGH (ref 70–99)

## 2020-03-19 MED ORDER — ACETAMINOPHEN 500 MG PO TABS
1000.0000 mg | ORAL_TABLET | Freq: Once | ORAL | Status: AC
  Administered 2020-03-19: 1000 mg via ORAL
  Filled 2020-03-19: qty 2

## 2020-03-19 MED ORDER — IBUPROFEN 100 MG/5ML PO SUSP
600.0000 mg | Freq: Once | ORAL | Status: DC
  Filled 2020-03-19: qty 30

## 2020-03-19 MED ORDER — PROMETHAZINE HCL 25 MG/ML IJ SOLN
25.0000 mg | Freq: Once | INTRAMUSCULAR | Status: AC
  Administered 2020-03-19: 25 mg via INTRAMUSCULAR
  Filled 2020-03-19: qty 1

## 2020-03-19 MED ORDER — FENTANYL CITRATE (PF) 100 MCG/2ML IJ SOLN
100.0000 ug | Freq: Once | INTRAMUSCULAR | Status: AC
Start: 2020-03-20 — End: 2020-03-20
  Administered 2020-03-20: 100 ug via INTRAVENOUS
  Filled 2020-03-19: qty 2

## 2020-03-19 NOTE — ED Notes (Signed)
Pt states that she cannot give a urine sample, and that she cannot sit still for the blood pressure cuff.

## 2020-03-19 NOTE — ED Triage Notes (Signed)
Patient arrived with complaints of mid back pain, nausea and vomiting that started this morning.

## 2020-03-20 ENCOUNTER — Emergency Department (HOSPITAL_COMMUNITY): Payer: 59

## 2020-03-20 ENCOUNTER — Encounter (HOSPITAL_COMMUNITY): Payer: Self-pay

## 2020-03-20 LAB — CBG MONITORING, ED: Glucose-Capillary: 335 mg/dL — ABNORMAL HIGH (ref 70–99)

## 2020-03-20 IMAGING — CT CT ANGIO CHEST-ABD-PELV FOR DISSECTION W/ AND WO/W CM
2 of 7 series · 14 of 46 positions shown, 16 images · non-contrast
Comparison: [DATE]

CLINICAL DATA: Mid back pain.

EXAM:
CT ANGIOGRAPHY CHEST, ABDOMEN AND PELVIS
TECHNIQUE: Non-contrast CT of the chest was initially obtained.

[Series 7: axial arterial · axial · arterial · 0.68mm/px · z∈[+1164,+1716]mm · 11 of 212 slices shown, 13 images]
[im 14/212  soft-tissue]
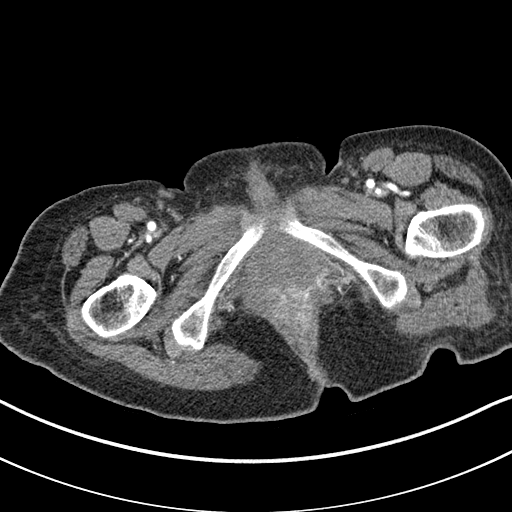
[im 14/212  bone]
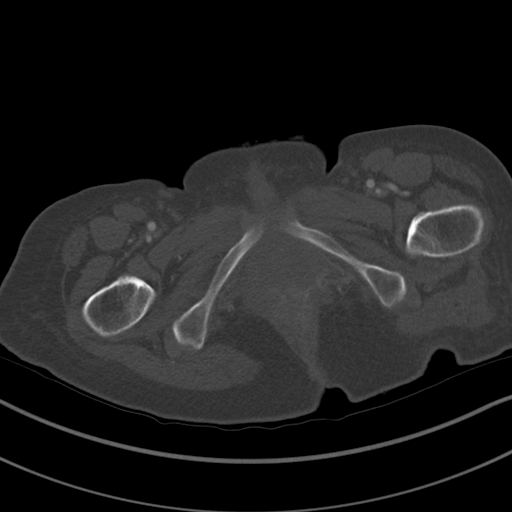
[im 40/212  soft-tissue]
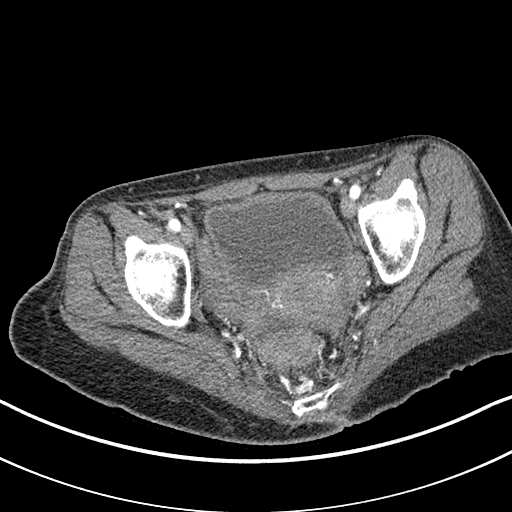
[im 53/212  soft-tissue]
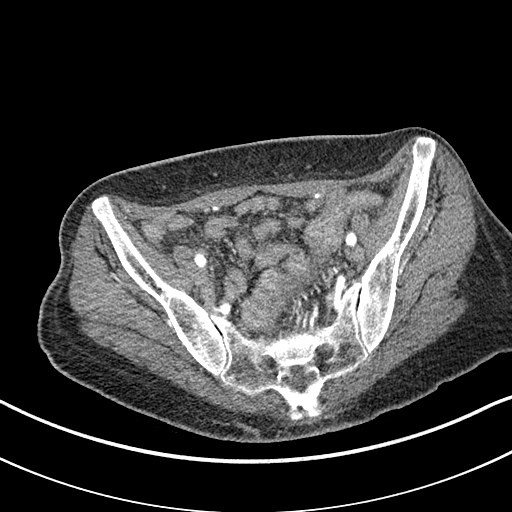
[im 66/212  soft-tissue]
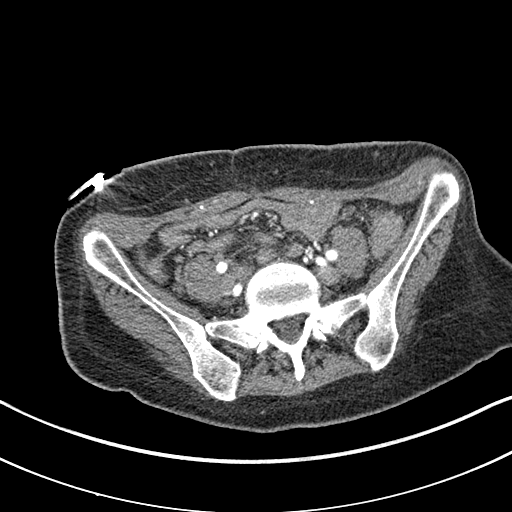
[im 93/212  soft-tissue]
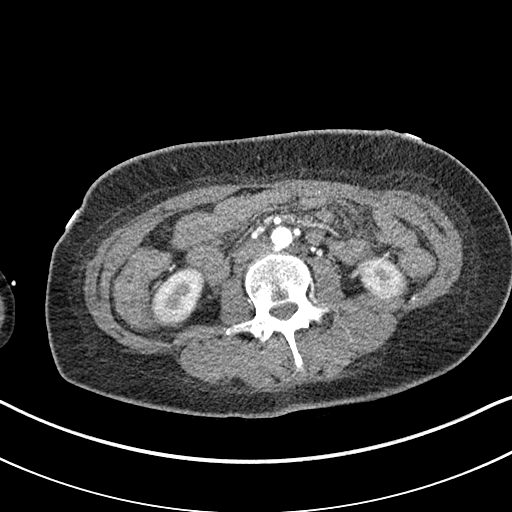
[im 106/212  soft-tissue]
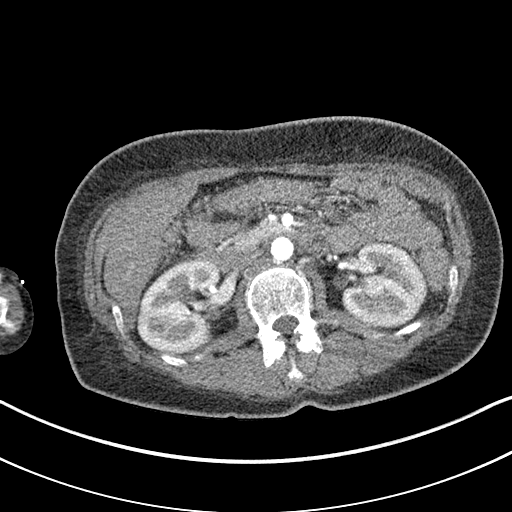
[im 119/212  soft-tissue]
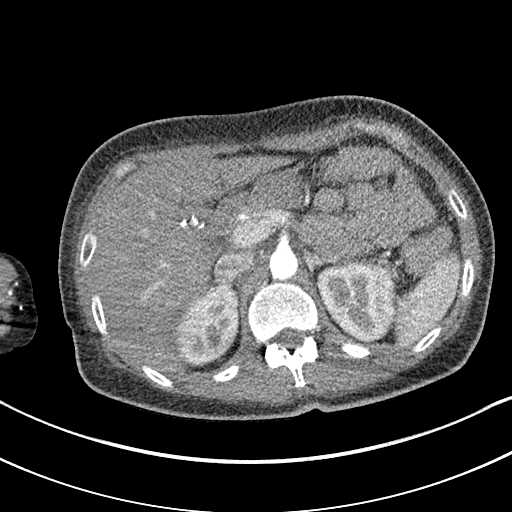
[im 146/212  soft-tissue]
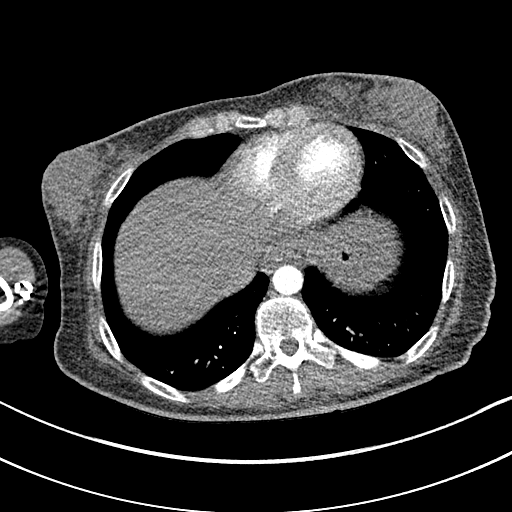
[im 159/212  soft-tissue]
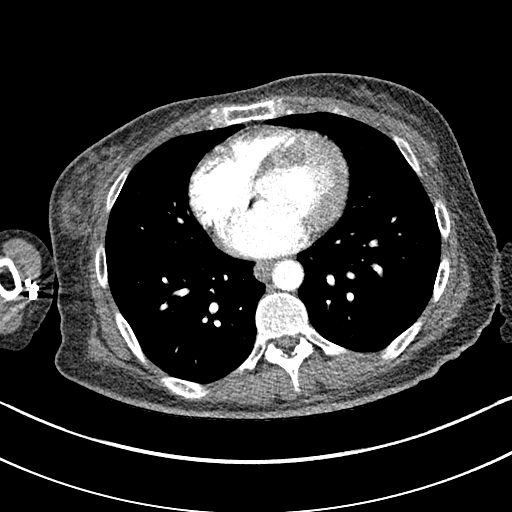
[im 159/212  bone]
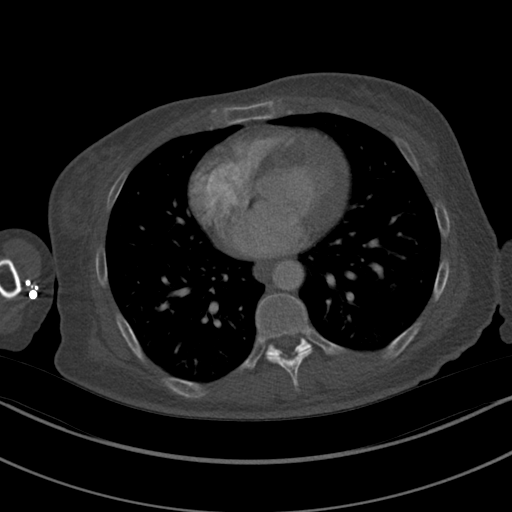
[im 172/212  soft-tissue]
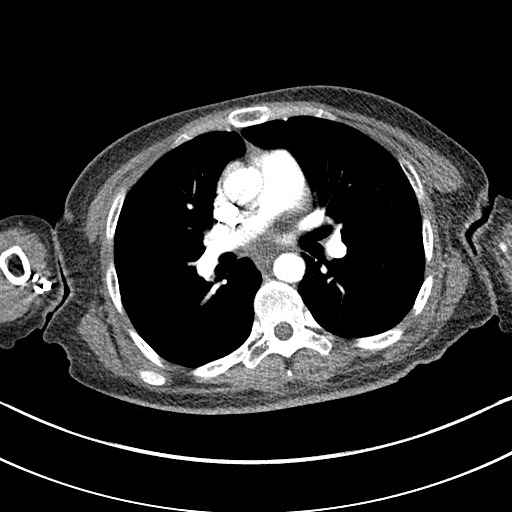
[im 198/212  soft-tissue]
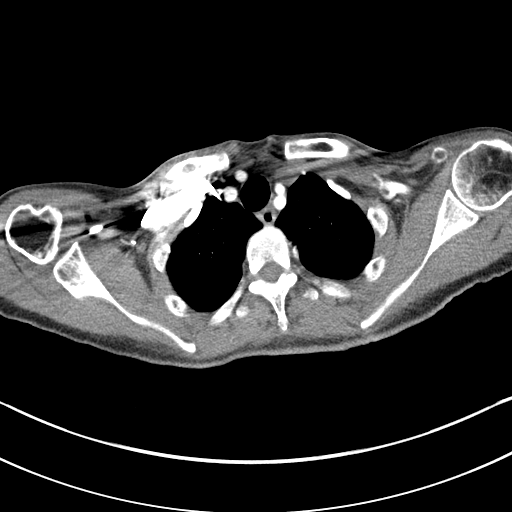

[Series 11: coronals · coronal · 0.71mm/px · 3 of 112 slices shown]
[im 28/112  soft-tissue]
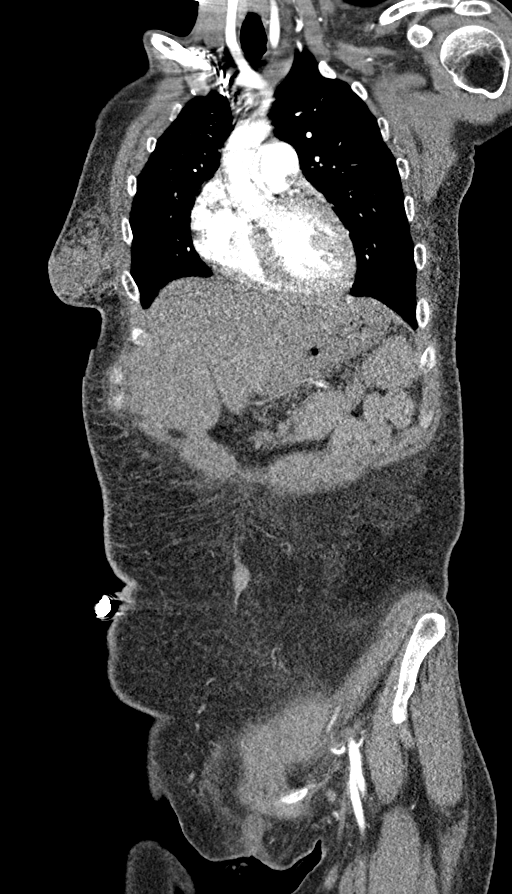
[im 56/112  soft-tissue]
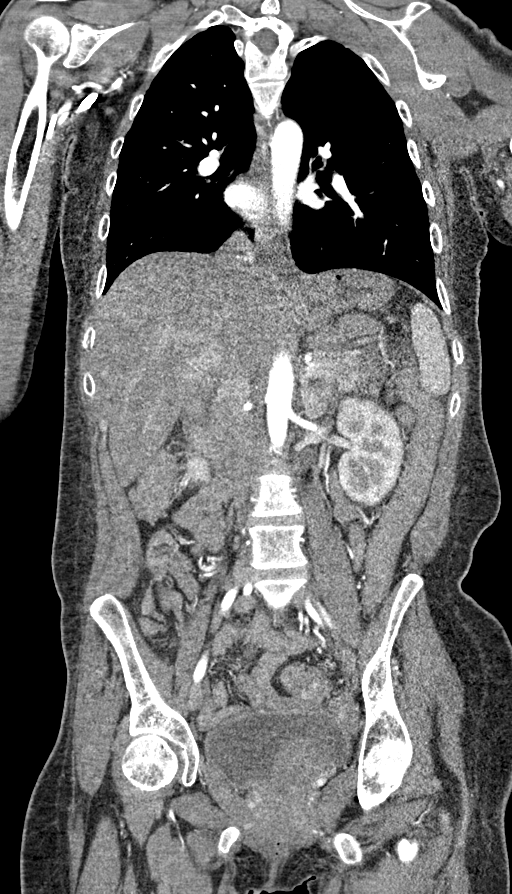
[im 84/112  soft-tissue]
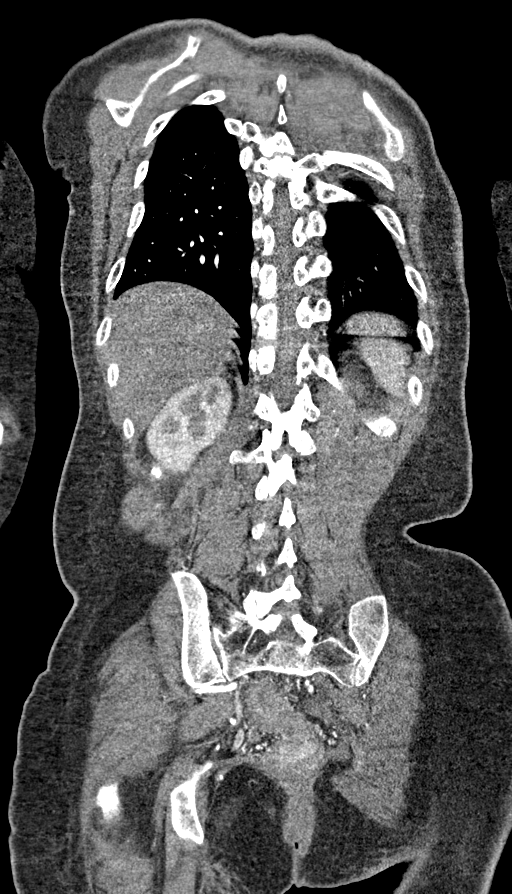

[14 of 46 positions shown; findings below may reference images not displayed]

Multidetector CT imaging through the chest, abdomen and pelvis was
performed using the standard protocol during bolus administration of
intravenous contrast. Multiplanar reconstructed images and MIPs were
obtained and reviewed to evaluate the vascular anatomy.

CONTRAST:  80mL OMNIPAQUE IOHEXOL 350 MG/ML SOLN
FINDINGS: CTA CHEST FINDINGS

Cardiovascular: Satisfactory opacification of the pulmonary arteries
to the segmental level. No evidence of pulmonary embolism. Normal
heart size. No pericardial effusion.

Mediastinum/Nodes: No enlarged mediastinal, hilar, or axillary lymph
nodes. Thyroid gland, trachea, and esophagus demonstrate no
significant findings.

Lungs/Pleura: A 3 mm calcified lung nodule is seen within the right
upper lobe.

There is no evidence of acute infiltrate, pleural effusion or
pneumothorax.

Musculoskeletal: No chest wall abnormality. No acute or significant
osseous findings.

Review of the MIP images confirms the above findings.

CTA ABDOMEN AND PELVIS FINDINGS

VASCULAR

Aorta: Normal caliber aorta without aneurysm, dissection, vasculitis
or significant stenosis.

Celiac: Patent without evidence of aneurysm, dissection, vasculitis
or significant stenosis.

SMA: Patent without evidence of aneurysm, dissection, vasculitis or
significant stenosis.

Renals: Both renal arteries are patent without evidence of aneurysm,
dissection, vasculitis, fibromuscular dysplasia or significant
stenosis.

IMA: Patent without evidence of aneurysm, dissection, vasculitis or
significant stenosis.

Inflow: Patent without evidence of aneurysm, dissection, vasculitis
or significant stenosis.

Veins: No obvious venous abnormality within the limitations of this
arterial phase study.

Review of the MIP images confirms the above findings.

NON-VASCULAR

Hepatobiliary: There is diffuse fatty infiltration of the liver
parenchyma. No focal liver abnormality is seen. Status post
cholecystectomy. No biliary dilatation.

Pancreas: Unremarkable. No pancreatic ductal dilatation or
surrounding inflammatory changes.

Spleen: Normal in size without focal abnormality.

Adrenals/Urinary Tract: Adrenal glands are unremarkable. Kidneys are
normal, without renal calculi, focal lesion, or hydronephrosis.
Bladder is unremarkable.

Stomach/Bowel: There is a small hiatal hernia. Appendix appears
normal. No evidence of bowel dilatation. Moderate to marked severity
diffusely thickened and inflamed large bowel is seen.

Lymphatic: No abnormal abdominal or pelvic lymph nodes are
identified.

Reproductive: Uterus and bilateral adnexa are unremarkable.

Other: No abdominal wall hernia or abnormality. No abdominopelvic
ascites.

Musculoskeletal: No acute or significant osseous findings.

Review of the MIP images confirms the above findings.
IMPRESSION: 1. Moderate to marked severity diffuse colitis.
2. Fatty liver.
3. Evidence of prior cholecystectomy.

## 2020-03-20 MED ORDER — HYDROMORPHONE HCL 1 MG/ML IJ SOLN
1.0000 mg | Freq: Once | INTRAMUSCULAR | Status: AC
  Administered 2020-03-20: 1 mg via INTRAVENOUS
  Filled 2020-03-20: qty 1

## 2020-03-20 MED ORDER — CIPROFLOXACIN HCL 500 MG PO TABS: 500.0000 mg | ORAL_TABLET | Freq: Two times a day (BID) | ORAL | 0 refills | Status: DC

## 2020-03-20 MED ORDER — PROCHLORPERAZINE EDISYLATE 10 MG/2ML IJ SOLN
10.0000 mg | Freq: Once | INTRAMUSCULAR | Status: AC
  Administered 2020-03-20: 10 mg via INTRAVENOUS
  Filled 2020-03-20: qty 2

## 2020-03-20 MED ORDER — DICYCLOMINE HCL 10 MG PO CAPS
10.0000 mg | ORAL_CAPSULE | Freq: Once | ORAL | Status: AC
  Administered 2020-03-20: 10 mg via ORAL
  Filled 2020-03-20: qty 1

## 2020-03-20 MED ORDER — HYDRALAZINE HCL 25 MG PO TABS
50.0000 mg | ORAL_TABLET | Freq: Once | ORAL | Status: AC
  Administered 2020-03-20: 50 mg via ORAL
  Filled 2020-03-20: qty 2

## 2020-03-20 MED ORDER — PROMETHAZINE HCL 25 MG PO TABS: 25.0000 mg | ORAL_TABLET | Freq: Four times a day (QID) | ORAL | 0 refills | Status: DC | PRN

## 2020-03-20 MED ORDER — LACTATED RINGERS IV BOLUS
1000.0000 mL | Freq: Once | INTRAVENOUS | Status: AC
  Administered 2020-03-20: 1000 mL via INTRAVENOUS

## 2020-03-20 MED ORDER — LABETALOL HCL 5 MG/ML IV SOLN
5.0000 mg | Freq: Once | INTRAVENOUS | Status: AC
  Administered 2020-03-20: 5 mg via INTRAVENOUS
  Filled 2020-03-20: qty 4

## 2020-03-20 MED ORDER — PROMETHAZINE HCL 25 MG RE SUPP: 25.0000 mg | Freq: Four times a day (QID) | RECTAL | 0 refills | Status: DC | PRN

## 2020-03-20 MED ORDER — CIPROFLOXACIN HCL 500 MG PO TABS
500.0000 mg | ORAL_TABLET | Freq: Once | ORAL | Status: AC
  Administered 2020-03-20: 500 mg via ORAL
  Filled 2020-03-20: qty 1

## 2020-03-20 MED ORDER — OXYCODONE-ACETAMINOPHEN 5-325 MG PO TABS
2.0000 | ORAL_TABLET | Freq: Once | ORAL | Status: AC
  Administered 2020-03-20: 2 via ORAL
  Filled 2020-03-20: qty 2

## 2020-03-20 MED ORDER — INSULIN ASPART 100 UNIT/ML ~~LOC~~ SOLN
5.0000 [IU] | Freq: Once | SUBCUTANEOUS | Status: AC
  Administered 2020-03-20: 5 [IU] via INTRAVENOUS
  Filled 2020-03-20: qty 0.05

## 2020-03-20 MED ORDER — IOHEXOL 350 MG/ML SOLN
100.0000 mL | Freq: Once | INTRAVENOUS | Status: AC | PRN
  Administered 2020-03-20: 80 mL via INTRAVENOUS

## 2020-03-20 NOTE — ED Notes (Addendum)
Pt not in her room at this time; unable to get IV started

## 2020-03-20 NOTE — ED Provider Notes (Signed)
[facility] DEPT Provider Note   CSN: [id number] Arrival date & time: 03/19/20  1911     History Chief Complaint  Patient presents with  . Back Pain  . Emesis    [name] is a 29 y.o. female.  29 year old female who presents the emerge from today with back pain.  Patient states she has severe upper back pain.  She states this started when she woke up today.  Been associated with emesis.  She did not try to take any thing at home because she was throwing up too much.  No fevers.  No sick contacts.  No history of the same.  States the pain radiates upwards and downwards and sometimes towards her chest.  Patient stable lead history from as she is throwing herself all over the place and laying face down on the bed with her knees on the ground.  States no trauma.  She doesn't feel like it is from muscle.  No constipation.   Back Pain Emesis      Past Medical History:  Diagnosis Date  . Allergy   . Anemia   . Anxiety   . Blood transfusion without reported diagnosis    Dec 2018  . Cataract    right eye  . COVID-19   . Depression   . Diabetes type 1, uncontrolled ([place]) 11/14/2011   Since age 15  . Fibromyalgia   . Gastroparesis   . GERD (gastroesophageal reflux disease)   . Hypertension   . Infection    UTI April 2016    Patient Active Problem List   Diagnosis Date Noted  . 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 Hypertensive urgency 04/30/2019  . Cannabinoid hyperemesis syndrome 04/30/2019  . Contraception management 08/06/2018  . Cyclical vomiting syndrome 06/07/2018  . Diabetic gastroparesis associated with type 1 diabetes mellitus ([place]) 06/04/2018  . Anxiety 05/31/2018  . Peripheral edema 05/31/2018  . Normocytic anemia 07/10/2017  . GERD (gastroesophageal reflux disease) 06/23/2017  . Intractable nausea and vomiting 04/11/2017  . MDD (major depressive disorder), recurrent episode, mild ([place])  02/28/2017  . Diabetes mellitus type 1 ([place]) 04/28/2013  . Protein-calorie malnutrition, severe ([place]) 04/28/2013  . Hypertension associated with diabetes ([place]) 11/14/2011  . Chronic abdominal pain 09/18/2011    Past Surgical History:  Procedure Laterality Date  . ANKLE SURGERY    . CHOLECYSTECTOMY  11/15/2011   Procedure: LAPAROSCOPIC CHOLECYSTECTOMY WITH INTRAOPERATIVE CHOLANGIOGRAM;  Surgeon: [name], MD;  Location: [facility];  Service: General;  Laterality: N/A;  . COLONOSCOPY    . COLONOSCOPY WITH PROPOFOL N/A 06/27/2017   Procedure: COLONOSCOPY WITH PROPOFOL;  Surgeon: Milus Banister, MD;  Location: [facility];  Service: Endoscopy;  Laterality: N/A;  . ESOPHAGOGASTRODUODENOSCOPY  12/03/2011   Procedure: ESOPHAGOGASTRODUODENOSCOPY (EGD);  Surgeon: Beryle Beams, MD;  Location: [facility];  Service: Endoscopy;  Laterality: N/A;  . FLEXIBLE SIGMOIDOSCOPY N/A 03/10/2017   Procedure: FLEXIBLE SIGMOIDOSCOPY;  Surgeon: Carol Ada, MD;  Location: [facility];  Service: Endoscopy;  Laterality: N/A;  . INCISION AND DRAINAGE PERIRECTAL ABSCESS N/A 03/01/2017   Procedure: IRRIGATION AND DEBRIDEMENT PERIRECTAL ABSCESS;  Surgeon: Alphonsa Overall, MD;  Location: [facility];  Service: General;  Laterality: N/A;  . IRRIGATION AND DEBRIDEMENT BUTTOCKS N/A 03/23/2017   Procedure: IRRIGATION AND DEBRIDEMENT BUTTOCKS, SETON PLACEMENT;  Surgeon: [name], MD;  Location: [facility];  Service: General;  Laterality: N/A;  . LAPAROSCOPY  11/23/2011   Procedure: LAPAROSCOPY  DIAGNOSTIC;  Surgeon: Edward Jolly, MD;  Location: [facility];  Service: General;  Laterality: N/A;  . SIGMOIDOSCOPY    . UPPER GASTROINTESTINAL ENDOSCOPY    . WISDOM TOOTH EXTRACTION       OB History    Gravida  1   Para  1   Term      Preterm  1   AB      Living  1     SAB      IAB      Ectopic      Multiple  0   Live Births  1           Family History  Problem Relation Age of Onset  . Diabetes  Mother   . Hypertension Father   . Colon cancer Paternal Grandmother        pt thinks PGM was dx in her 47's  . Diabetes Paternal Grandmother   . Diabetes Maternal Grandmother   . Diabetes Maternal Grandfather   . Diabetes Paternal Grandfather   . Diabetes Other   . Breast cancer Paternal Aunt   . Esophageal cancer Neg Hx   . Liver cancer Neg Hx   . Pancreatic cancer Neg Hx   . Stomach cancer Neg Hx   . Rectal cancer Neg Hx     Social History   Tobacco Use  . Smoking status: Never Smoker  . Smokeless tobacco: Never Used  Vaping Use  . Vaping Use: Never used  Substance Use Topics  . Alcohol use: No    Alcohol/week: 0.0 standard drinks  . Drug use: Yes    Types: Marijuana    Home Medications Prior to Admission medications   Medication Sig Start Date End Date Taking? Authorizing Provider  aluminum-magnesium hydroxide-simethicone (MAALOX) 166-063-01 MG/5ML SUSP Take 30 mLs by mouth 3 (three) times daily as needed (indigestion).   Yes [provider]  busPIRone (BUSPAR) 5 MG tablet Take 1 tablet (5 mg total) by mouth 3 (three) times daily. 02/03/20  Yes [name], MD  carvedilol (COREG) 6.25 MG tablet Take 6.25 mg by mouth 2 (two) times daily with a meal.   Yes [provider]  cyclobenzaprine (FLEXERIL) 10 MG tablet TAKE 1 TABLET(10 MG) BY MOUTH THREE TIMES DAILY AS NEEDED FOR MUSCLE SPASMS Patient taking differently: Take 10 mg by mouth 3 (three) times daily as needed for muscle spasms. 03/07/20  Yes [name], MD  dicyclomine (BENTYL) 20 MG tablet Take 1 tablet (20 mg total) by mouth every 8 (eight) hours as needed for spasms. 11/25/19  Yes Petrucelli, Samantha R, PA-C  hydrALAZINE (APRESOLINE) 50 MG tablet TAKE 1 TABLET(50 MG) BY MOUTH EVERY 6 HOURS Patient taking differently: Take 50 mg by mouth every 6 (six) hours. 01/30/20  Yes [name], MD  Insulin NPH, Human,, Isophane, (HUMULIN N KWIKPEN) 100 UNIT/ML Kiwkpen Inject 20 Units into the skin  every morning. 03/02/20  Yes Renato Shin, MD  isosorbide mononitrate (IMDUR) 30 MG 24 hr tablet Take 30 mg by mouth daily. 01/03/20  Yes [provider]  loperamide (IMODIUM) 2 MG capsule Take 6 mg by mouth daily as needed for diarrhea or loose stools.    Yes [provider]  LORazepam (ATIVAN) 0.5 MG tablet TAKE 1 TABLET(0.5 MG) BY MOUTH TWICE DAILY AS NEEDED FOR ANXIETY Patient taking differently: Take 0.5 mg by mouth 2 (two) times daily as needed for anxiety. 03/07/20  Yes [name], MD  metoCLOPramide (  REGLAN) 5 MG tablet Take 1 tablet (5 mg total) by mouth in the morning and at bedtime. Please schedule an Office visit for further refills. Thank you 01/30/20  Yes Armbruster, Carlota Raspberry, MD  mirtazapine (REMERON) 30 MG tablet TAKE 1 TABLET BY MOUTH AT  BEDTIME Patient taking differently: Take 30 mg by mouth at bedtime. 01/17/20  Yes [name], MD  ondansetron (ZOFRAN) 4 MG tablet Take 1 tablet (4 mg total) by mouth every 8 (eight) hours as needed for up to 20 doses for nausea or vomiting. 11/23/19  Yes Curatolo, Adam, DO  pantoprazole (PROTONIX) 40 MG tablet Take 1 tablet (40 mg total) by mouth 2 (two) times daily. 12/06/19  Yes Armbruster, Carlota Raspberry, MD  scopolamine (TRANSDERM-SCOP) 1 MG/3DAYS Place 1 patch onto the skin every 3 (three) days.   Yes [provider]  sucralfate (CARAFATE) 1 GM/10ML suspension Take 10 mLs (1 g total) by mouth every 6 (six) hours as needed. Patient taking differently: Take 1 g by mouth every 6 (six) hours as needed (stomach). 11/24/19  Yes Armbruster, Carlota Raspberry, MD  ciprofloxacin (CIPRO) 500 MG tablet Take 1 tablet (500 mg total) by mouth 2 (two) times daily. 03/20/20   Kaysin Brock, Corene Cornea, MD  Continuous Blood Gluc Sensor (DEXCOM G6 SENSOR) MISC USE AS DIRECTED WITH CONTINUOUS BLOOD GLUCOSE MONITOR. REPLACE EVERY 10 DAYS 02/27/20   Renato Shin, MD  Continuous Blood Gluc Transmit (DEXCOM G6 TRANSMITTER) MISC USE AS DIRECTED TO  MONITOR   GLUCOSE  LEVELS 01/19/20   Renato Shin, MD  promethazine (PHENERGAN) 25 MG suppository Place 1 suppository (25 mg total) rectally every 6 (six) hours as needed for nausea or vomiting. 03/20/20   Courteney Alderete, Corene Cornea, MD  promethazine (PHENERGAN) 25 MG tablet Take 1 tablet (25 mg total) by mouth every 6 (six) hours as needed for nausea or vomiting. 03/20/20   Kellan Boehlke, Corene Cornea, MD  potassium chloride SA (KLOR-CON) 20 MEQ tablet Take 1 tablet (20 mEq total) by mouth daily for 5 doses. 07/04/19 07/30/19  Janeece Fitting, PA-C    Allergies    Other and Lactose intolerance (gi)  Review of Systems   Review of Systems  Gastrointestinal: Positive for vomiting.  Musculoskeletal: Positive for back pain.  All other systems reviewed and are negative.   Physical Exam Updated Vital Signs BP (!) 126/93   Pulse 72   Temp 97.8 F (36.6 C) (Oral)   Resp 16   SpO2 99%   Physical Exam Vitals and nursing note reviewed.  Constitutional:      Appearance: She is well-developed and well-nourished.  HENT:     Head: Normocephalic and atraumatic.     Nose: No congestion or rhinorrhea.     Mouth/Throat:     Mouth: Mucous membranes are moist.     Pharynx: Oropharynx is clear.  Eyes:     Pupils: Pupils are equal, round, and reactive to light.  Cardiovascular:     Rate and Rhythm: Normal rate and regular rhythm.  Pulmonary:     Effort: No respiratory distress.     Breath sounds: No stridor.  Abdominal:     General: There is no distension.  Musculoskeletal:        General: No swelling or tenderness. Normal range of motion.     Cervical back: Normal range of motion.  Skin:    General: Skin is warm and dry.  Neurological:     General: No focal deficit present.     Mental Status: She is  alert.     ED Results / Procedures / Treatments   Labs (all labs ordered are listed, but only abnormal results are displayed) Labs Reviewed  COMPREHENSIVE METABOLIC PANEL - Abnormal; Notable for the following components:       Result Value   Glucose, Bld 305 (*)    Creatinine, Ser 1.25 (*)    Total Protein 8.3 (*)    Albumin 3.3 (*)    GFR, Estimated 59 (*)    All other components within normal limits  CBC - Abnormal; Notable for the following components:   RBC 3.73 (*)    Hemoglobin 10.4 (*)    HCT 32.5 (*)    All other components within normal limits  URINALYSIS, ROUTINE W REFLEX MICROSCOPIC - Abnormal; Notable for the following components:   Glucose, UA >=500 (*)    Hgb urine dipstick MODERATE (*)    Ketones, ur 20 (*)    Protein, ur >=300 (*)    RBC / HPF >50 (*)    Bacteria, UA RARE (*)    All other components within normal limits  CBG MONITORING, ED - Abnormal; Notable for the following components:   Glucose-Capillary 281 (*)    All other components within normal limits  CBG MONITORING, ED - Abnormal; Notable for the following components:   Glucose-Capillary 335 (*)    All other components within normal limits  LIPASE, BLOOD  I-STAT BETA HCG BLOOD, ED ([facility], [facility], [facility] ONLY)    EKG None  Radiology CT Angio Chest/Abd/Pel for Dissection W and/or Wo Contrast  Result Date: 03/20/2020 CLINICAL DATA:  Mid back pain. EXAM: CT ANGIOGRAPHY CHEST, ABDOMEN AND PELVIS TECHNIQUE: Non-contrast CT of the chest was initially obtained. Multidetector CT imaging through the chest, abdomen and pelvis was performed using the standard protocol during bolus administration of intravenous contrast. Multiplanar reconstructed images and MIPs were obtained and reviewed to evaluate the vascular anatomy. CONTRAST:  48m OMNIPAQUE IOHEXOL 350 MG/ML SOLN COMPARISON:  February 13, 2020 FINDINGS: CTA CHEST FINDINGS Cardiovascular: Satisfactory opacification of the pulmonary arteries to the segmental level. No evidence of pulmonary embolism. Normal heart size. No pericardial effusion. Mediastinum/Nodes: No enlarged mediastinal, hilar, or axillary lymph nodes. Thyroid gland, trachea, and esophagus demonstrate no significant findings.  Lungs/Pleura: A 3 mm calcified lung nodule is seen within the right upper lobe. There is no evidence of acute infiltrate, pleural effusion or pneumothorax. Musculoskeletal: No chest wall abnormality. No acute or significant osseous findings. Review of the MIP images confirms the above findings. CTA ABDOMEN AND PELVIS FINDINGS VASCULAR Aorta: Normal caliber aorta without aneurysm, dissection, vasculitis or significant stenosis. Celiac: Patent without evidence of aneurysm, dissection, vasculitis or significant stenosis. SMA: Patent without evidence of aneurysm, dissection, vasculitis or significant stenosis. Renals: Both renal arteries are patent without evidence of aneurysm, dissection, vasculitis, fibromuscular dysplasia or significant stenosis. IMA: Patent without evidence of aneurysm, dissection, vasculitis or significant stenosis. Inflow: Patent without evidence of aneurysm, dissection, vasculitis or significant stenosis. Veins: No obvious venous abnormality within the limitations of this arterial phase study. Review of the MIP images confirms the above findings. NON-VASCULAR Hepatobiliary: There is diffuse fatty infiltration of the liver parenchyma. No focal liver abnormality is seen. Status post cholecystectomy. No biliary dilatation. Pancreas: Unremarkable. No pancreatic ductal dilatation or surrounding inflammatory changes. Spleen: Normal in size without focal abnormality. Adrenals/Urinary Tract: Adrenal glands are unremarkable. Kidneys are normal, without renal calculi, focal lesion, or hydronephrosis. Bladder is unremarkable. Stomach/Bowel: There is a small hiatal hernia. Appendix appears normal.  No evidence of bowel dilatation. Moderate to marked severity diffusely thickened and inflamed large bowel is seen. Lymphatic: No abnormal abdominal or pelvic lymph nodes are identified. Reproductive: Uterus and bilateral adnexa are unremarkable. Other: No abdominal wall hernia or abnormality. No abdominopelvic  ascites. Musculoskeletal: No acute or significant osseous findings. Review of the MIP images confirms the above findings. IMPRESSION: 1. Moderate to marked severity diffuse colitis. 2. Fatty liver. 3. Evidence of prior cholecystectomy. Electronically Signed   By: Virgina Norfolk M.D.   On: 03/20/2020 02:32    Procedures Procedures (including critical care time)  Medications Ordered in ED Medications  ibuprofen (ADVIL) 100 MG/5ML suspension 600 mg (600 mg Oral Patient Refused/Not Given 03/20/20 0104)  promethazine (PHENERGAN) injection 25 mg (25 mg Intramuscular Given 03/19/20 2242)  acetaminophen (TYLENOL) tablet 1,000 mg (1,000 mg Oral Given 03/19/20 2241)  fentaNYL (SUBLIMAZE) injection 100 mcg (100 mcg Intravenous Given 03/20/20 0104)  iohexol (OMNIPAQUE) 350 MG/ML injection 100 mL (80 mLs Intravenous Contrast Given 03/20/20 0153)  prochlorperazine (COMPAZINE) injection 10 mg (10 mg Intravenous Given 03/20/20 0249)  lactated ringers bolus 1,000 mL (0 mLs Intravenous Stopped 03/20/20 0344)  HYDROmorphone (DILAUDID) injection 1 mg (1 mg Intravenous Given 03/20/20 0248)  hydrALAZINE (APRESOLINE) tablet 50 mg (50 mg Oral Given 03/20/20 0250)  dicyclomine (BENTYL) capsule 10 mg (10 mg Oral Given 03/20/20 0250)  labetalol (NORMODYNE) injection 5 mg (5 mg Intravenous Given 03/20/20 0248)  insulin aspart (novoLOG) injection 5 Units (5 Units Intravenous Given 03/20/20 0507)  oxyCODONE-acetaminophen (PERCOCET/ROXICET) 5-325 MG per tablet 2 tablet (2 tablets Oral Given 03/20/20 0503)  ciprofloxacin (CIPRO) tablet 500 mg (500 mg Oral Given 03/20/20 0502)    ED Course  I have reviewed the triage vital signs and the nursing notes.  Pertinent labs & imaging results that were available during my care of the patient were reviewed by me and considered in my medical decision making (see chart for details).    MDM Rules/Calculators/A&P                         Lab work returns no obvious cause for  her pain.  Specifically I am worried about aortic dissection with her hypertension (longstanding) and her back pain is sometimes radiates towards her chest is otherwise nondescript and described as severe.  I do see that she has a long history of being emergency room for pain related symptoms however it is difficult to exclude this diagnosis.  We'll going give a dose of fentanyl and order dissection study. Pain resolved.  Patient tolerating p.o.  Her blood pressure is also significantly improved I suspect is related to the pain improvement, improved nausea but also the medications given here in the emergency room.  Will discharge on antiemetics.  Will follow up with her doctors.  Colitis on CT scan likely the cause for emesis. Apparently has been worked up for UC/Crohn's/etc.  Will discharge on Cipro.  Final Clinical Impression(s) / ED Diagnoses Final diagnoses:  Non-intractable vomiting with nausea, unspecified vomiting type  Nausea and vomiting, intractability of vomiting not specified, unspecified vomiting type    Rx / DC Orders ED Discharge Orders         Ordered    ciprofloxacin (CIPRO) 500 MG tablet  2 times daily        03/20/20 0446    promethazine (PHENERGAN) 25 MG tablet  Every 6 hours PRN        03/20/20 0446  promethazine (PHENERGAN) 25 MG suppository  Every 6 hours PRN        03/20/20 0446           Besse Miron, Corene Cornea, MD 03/20/20 0700

## 2020-03-20 NOTE — ED Notes (Signed)
Attempted to update pt's vital signs but pt was not in room at this time or when I checked previously.

## 2020-03-20 NOTE — ED Notes (Signed)
Attempted to get pt's vitals, pt is not in her room at this time

## 2020-03-21 ENCOUNTER — Other Ambulatory Visit: Payer: Self-pay

## 2020-03-21 ENCOUNTER — Encounter (HOSPITAL_COMMUNITY): Payer: Self-pay | Admitting: Emergency Medicine

## 2020-03-21 ENCOUNTER — Inpatient Hospital Stay (HOSPITAL_COMMUNITY)
Admission: EM | Admit: 2020-03-21 | Discharge: 2020-03-24 | DRG: 637 | Disposition: A | Discharge status: Home / Self Care | Payer: 59 | Attending: Internal Medicine | Admitting: Internal Medicine

## 2020-03-21 DIAGNOSIS — M797 Fibromyalgia: Secondary | ICD-10-CM | POA: Diagnosis present

## 2020-03-21 DIAGNOSIS — K219 Gastro-esophageal reflux disease without esophagitis: Secondary | ICD-10-CM | POA: Diagnosis present

## 2020-03-21 DIAGNOSIS — K3184 Gastroparesis: Secondary | ICD-10-CM | POA: Diagnosis present

## 2020-03-21 DIAGNOSIS — I152 Hypertension secondary to endocrine disorders: Secondary | ICD-10-CM | POA: Diagnosis present

## 2020-03-21 DIAGNOSIS — R1110 Vomiting, unspecified: Secondary | ICD-10-CM | POA: Diagnosis present

## 2020-03-21 DIAGNOSIS — Z6820 Body mass index (BMI) 20.0-20.9, adult: Secondary | ICD-10-CM

## 2020-03-21 DIAGNOSIS — F419 Anxiety disorder, unspecified: Secondary | ICD-10-CM | POA: Diagnosis present

## 2020-03-21 DIAGNOSIS — R112 Nausea with vomiting, unspecified: Secondary | ICD-10-CM | POA: Diagnosis present

## 2020-03-21 DIAGNOSIS — Z79899 Other long term (current) drug therapy: Secondary | ICD-10-CM

## 2020-03-21 DIAGNOSIS — Z8249 Family history of ischemic heart disease and other diseases of the circulatory system: Secondary | ICD-10-CM

## 2020-03-21 DIAGNOSIS — Z803 Family history of malignant neoplasm of breast: Secondary | ICD-10-CM

## 2020-03-21 DIAGNOSIS — N179 Acute kidney failure, unspecified: Secondary | ICD-10-CM | POA: Diagnosis present

## 2020-03-21 DIAGNOSIS — Z20822 Contact with and (suspected) exposure to covid-19: Secondary | ICD-10-CM | POA: Diagnosis present

## 2020-03-21 DIAGNOSIS — E860 Dehydration: Secondary | ICD-10-CM | POA: Diagnosis present

## 2020-03-21 DIAGNOSIS — D649 Anemia, unspecified: Secondary | ICD-10-CM | POA: Diagnosis present

## 2020-03-21 DIAGNOSIS — E1043 Type 1 diabetes mellitus with diabetic autonomic (poly)neuropathy: Secondary | ICD-10-CM | POA: Diagnosis present

## 2020-03-21 DIAGNOSIS — Z833 Family history of diabetes mellitus: Secondary | ICD-10-CM

## 2020-03-21 DIAGNOSIS — I10 Essential (primary) hypertension: Secondary | ICD-10-CM | POA: Diagnosis present

## 2020-03-21 DIAGNOSIS — E739 Lactose intolerance, unspecified: Secondary | ICD-10-CM | POA: Diagnosis present

## 2020-03-21 DIAGNOSIS — F330 Major depressive disorder, recurrent, mild: Secondary | ICD-10-CM | POA: Diagnosis present

## 2020-03-21 DIAGNOSIS — Z794 Long term (current) use of insulin: Secondary | ICD-10-CM

## 2020-03-21 DIAGNOSIS — E1110 Type 2 diabetes mellitus with ketoacidosis without coma: Secondary | ICD-10-CM | POA: Diagnosis present

## 2020-03-21 DIAGNOSIS — E1159 Type 2 diabetes mellitus with other circulatory complications: Secondary | ICD-10-CM | POA: Diagnosis present

## 2020-03-21 DIAGNOSIS — N183 Chronic kidney disease, stage 3 unspecified: Secondary | ICD-10-CM | POA: Diagnosis present

## 2020-03-21 DIAGNOSIS — R1115 Cyclical vomiting syndrome unrelated to migraine: Secondary | ICD-10-CM | POA: Diagnosis present

## 2020-03-21 DIAGNOSIS — E43 Unspecified severe protein-calorie malnutrition: Secondary | ICD-10-CM | POA: Diagnosis present

## 2020-03-21 DIAGNOSIS — E1010 Type 1 diabetes mellitus with ketoacidosis without coma: Secondary | ICD-10-CM

## 2020-03-21 DIAGNOSIS — Z8616 Personal history of COVID-19: Secondary | ICD-10-CM

## 2020-03-21 DIAGNOSIS — M546 Pain in thoracic spine: Secondary | ICD-10-CM | POA: Diagnosis present

## 2020-03-21 DIAGNOSIS — E1022 Type 1 diabetes mellitus with diabetic chronic kidney disease: Secondary | ICD-10-CM | POA: Diagnosis present

## 2020-03-21 LAB — CBC
HCT: 31.5 % — ABNORMAL LOW (ref 36.0–46.0)
Hemoglobin: 10.3 g/dL — ABNORMAL LOW (ref 12.0–15.0)
MCH: 28 pg (ref 26.0–34.0)
MCHC: 32.7 g/dL (ref 30.0–36.0)
MCV: 85.6 fL (ref 80.0–100.0)
Platelets: 277 10*3/uL (ref 150–400)
RBC: 3.68 MIL/uL — ABNORMAL LOW (ref 3.87–5.11)
RDW: 12.9 % (ref 11.5–15.5)
WBC: 6.9 10*3/uL (ref 4.0–10.5)
nRBC: 0 % (ref 0.0–0.2)

## 2020-03-21 LAB — BLOOD GAS, VENOUS
Acid-Base Excess: 7.3 mmol/L — ABNORMAL HIGH (ref 0.0–2.0)
Bicarbonate: 32.9 mmol/L — ABNORMAL HIGH (ref 20.0–28.0)
O2 Saturation: 67.2 %
Patient temperature: 98.6
pCO2, Ven: 55.5 mmHg (ref 44.0–60.0)
pH, Ven: 7.391 (ref 7.250–7.430)
pO2, Ven: 40.3 mmHg (ref 32.0–45.0)

## 2020-03-21 LAB — CBG MONITORING, ED
Glucose-Capillary: 562 mg/dL (ref 70–99)
Glucose-Capillary: 561 mg/dL (ref 70–99)
Glucose-Capillary: 359 mg/dL — ABNORMAL HIGH (ref 70–99)
Glucose-Capillary: 463 mg/dL — ABNORMAL HIGH (ref 70–99)

## 2020-03-21 LAB — COMPREHENSIVE METABOLIC PANEL
ALT: 26 U/L (ref 0–44)
AST: 26 U/L (ref 15–41)
Albumin: 3.8 g/dL (ref 3.5–5.0)
Alkaline Phosphatase: 106 U/L (ref 38–126)
Anion gap: 22 — ABNORMAL HIGH (ref 5–15)
BUN: 58 mg/dL — ABNORMAL HIGH (ref 6–20)
CO2: 32 mmol/L (ref 22–32)
Calcium: 8.4 mg/dL — ABNORMAL LOW (ref 8.9–10.3)
Chloride: 81 mmol/L — ABNORMAL LOW (ref 98–111)
Creatinine, Ser: 3.95 mg/dL — ABNORMAL HIGH (ref 0.44–1.00)
GFR, Estimated: 15 mL/min — ABNORMAL LOW (ref >60)
Glucose, Bld: 653 mg/dL (ref 70–99)
Potassium: 3.6 mmol/L (ref 3.5–5.1)
Sodium: 135 mmol/L (ref 135–145)
Total Bilirubin: 1 mg/dL (ref 0.3–1.2)
Total Protein: 8.9 g/dL — ABNORMAL HIGH (ref 6.5–8.1)

## 2020-03-21 LAB — LIPASE, BLOOD: Lipase: 24 U/L (ref 11–51)

## 2020-03-21 LAB — I-STAT BETA HCG BLOOD, ED (MC, WL, AP ONLY): I-stat hCG, quantitative: <5 m[IU]/mL (ref <5)

## 2020-03-21 LAB — BETA-HYDROXYBUTYRIC ACID: Beta-Hydroxybutyric Acid: 4.91 mmol/L — ABNORMAL HIGH (ref 0.05–0.27)

## 2020-03-21 MED ORDER — POTASSIUM CHLORIDE 10 MEQ/100ML IV SOLN
10.0000 meq | INTRAVENOUS | Status: AC
  Administered 2020-03-21 (×2): 10 meq via INTRAVENOUS
  Filled 2020-03-21 (×2): qty 100

## 2020-03-21 MED ORDER — DEXTROSE 50 % IV SOLN: 0.0000 mL | INTRAVENOUS | Status: DC | PRN

## 2020-03-21 MED ORDER — FENTANYL CITRATE (PF) 100 MCG/2ML IJ SOLN
50.0000 ug | Freq: Once | INTRAMUSCULAR | Status: AC
Start: 2020-03-21 — End: 2020-03-21
  Administered 2020-03-21: 50 ug via INTRAVENOUS
  Filled 2020-03-21: qty 2

## 2020-03-21 MED ORDER — FENTANYL CITRATE (PF) 100 MCG/2ML IJ SOLN
50.0000 ug | Freq: Once | INTRAMUSCULAR | Status: AC
  Administered 2020-03-21: 50 ug via INTRAVENOUS
  Filled 2020-03-21: qty 2

## 2020-03-21 MED ORDER — DEXTROSE IN LACTATED RINGERS 5 % IV SOLN: INTRAVENOUS | Status: DC

## 2020-03-21 MED ORDER — LACTATED RINGERS IV BOLUS
20.0000 mL/kg | Freq: Once | INTRAVENOUS | Status: AC
  Administered 2020-03-21: 1180 mL via INTRAVENOUS

## 2020-03-21 MED ORDER — LACTATED RINGERS IV SOLN: INTRAVENOUS | Status: DC

## 2020-03-21 MED ORDER — INSULIN REGULAR(HUMAN) IN NACL 100-0.9 UT/100ML-% IV SOLN
INTRAVENOUS | Status: DC
  Administered 2020-03-21: 5.5 [IU]/h via INTRAVENOUS
  Filled 2020-03-21: qty 100

## 2020-03-21 MED ORDER — ONDANSETRON HCL 4 MG/2ML IJ SOLN
4.0000 mg | Freq: Once | INTRAMUSCULAR | Status: AC
  Administered 2020-03-21: 4 mg via INTRAVENOUS
  Filled 2020-03-21: qty 2

## 2020-03-21 NOTE — ED Triage Notes (Signed)
Patient has been having emesis since yesterday, unable to keep anything down. Reports not being able to keep down her medication and phenergan suppositories have not been helping. Back pain as well.

## 2020-03-21 NOTE — ED Provider Notes (Signed)
[facility] DEPT Provider Note   CSN: [id number] Arrival date & time: 03/21/20  1818     History Chief Complaint  Patient presents with  . Hyperglycemia  . Emesis    [name] is a 29 y.o. female past medical history of type 1 diabetes, hypertension, cannabinoid hyperemesis, chronic abdominal pain, fibromyalgia, gastroparesis, presenting to the emergency department with complaint of nausea and vomiting that began yesterday.  She is also having persistent chronic mid back pain which she was evaluated for 2 days ago in the ED with negative CT imaging, per chart.  She is treated her vomiting with PR Phenergan and oral Phenergan without relief.  She has not taken insulin today due to the vomiting, states she is sensitive to the insulin and this can cause hypoglycemia, similar to her recent ED visit.  No fevers.  She states she is unable to recognize when she is in DKA and is unsure if she is today.  The history is provided by the patient and medical records.       Past Medical History:  Diagnosis Date  . Allergy   . Anemia   . Anxiety   . Blood transfusion without reported diagnosis    Dec 2018  . Cataract    right eye  . COVID-19   . Depression   . Diabetes type 1, uncontrolled ([place]) 11/14/2011   Since age 37  . Fibromyalgia   . Gastroparesis   . GERD (gastroesophageal reflux disease)   . Hypertension   . Infection    UTI April 2016    Patient Active Problem List   Diagnosis Date Noted  . 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 Hypertensive urgency 04/30/2019  . Cannabinoid hyperemesis syndrome 04/30/2019  . Contraception management 08/06/2018  . Cyclical vomiting syndrome 06/07/2018  . Diabetic gastroparesis associated with type 1 diabetes mellitus ([place]) 06/04/2018  . Anxiety 05/31/2018  . Peripheral edema 05/31/2018  . Normocytic anemia 07/10/2017  . GERD (gastroesophageal reflux disease) 06/23/2017  .  Intractable nausea and vomiting 04/11/2017  . MDD (major depressive disorder), recurrent episode, mild ([place]) 02/28/2017  . Diabetes mellitus type 1 ([place]) 04/28/2013  . Protein-calorie malnutrition, severe ([place]) 04/28/2013  . Hypertension associated with diabetes ([place]) 11/14/2011  . Chronic abdominal pain 09/18/2011    Past Surgical History:  Procedure Laterality Date  . ANKLE SURGERY    . CHOLECYSTECTOMY  11/15/2011   Procedure: LAPAROSCOPIC CHOLECYSTECTOMY WITH INTRAOPERATIVE CHOLANGIOGRAM;  Surgeon: [name], MD;  Location: [facility];  Service: General;  Laterality: N/A;  . COLONOSCOPY    . COLONOSCOPY WITH PROPOFOL N/A 06/27/2017   Procedure: COLONOSCOPY WITH PROPOFOL;  Surgeon: Milus Banister, MD;  Location: [facility];  Service: Endoscopy;  Laterality: N/A;  . ESOPHAGOGASTRODUODENOSCOPY  12/03/2011   Procedure: ESOPHAGOGASTRODUODENOSCOPY (EGD);  Surgeon: Beryle Beams, MD;  Location: [facility];  Service: Endoscopy;  Laterality: N/A;  . FLEXIBLE SIGMOIDOSCOPY N/A 03/10/2017   Procedure: FLEXIBLE SIGMOIDOSCOPY;  Surgeon: Carol Ada, MD;  Location: [facility];  Service: Endoscopy;  Laterality: N/A;  . INCISION AND DRAINAGE PERIRECTAL ABSCESS N/A 03/01/2017   Procedure: IRRIGATION AND DEBRIDEMENT PERIRECTAL ABSCESS;  Surgeon: Alphonsa Overall, MD;  Location: [facility];  Service: General;  Laterality: N/A;  . IRRIGATION AND DEBRIDEMENT BUTTOCKS N/A 03/23/2017   Procedure: IRRIGATION AND DEBRIDEMENT BUTTOCKS, SETON PLACEMENT;  Surgeon: [name], MD;  Location: [facility];  Service: General;  Laterality: N/A;  . LAPAROSCOPY  11/23/2011  Procedure: LAPAROSCOPY DIAGNOSTIC;  Surgeon: Edward Jolly, MD;  Location: [facility];  Service: General;  Laterality: N/A;  . SIGMOIDOSCOPY    . UPPER GASTROINTESTINAL ENDOSCOPY    . WISDOM TOOTH EXTRACTION       OB History    Gravida  1   Para  1   Term      Preterm  1   AB      Living  1     SAB      IAB      Ectopic       Multiple  0   Live Births  1           Family History  Problem Relation Age of Onset  . Diabetes Mother   . Hypertension Father   . Colon cancer Paternal Grandmother        pt thinks PGM was dx in her 79's  . Diabetes Paternal Grandmother   . Diabetes Maternal Grandmother   . Diabetes Maternal Grandfather   . Diabetes Paternal Grandfather   . Diabetes Other   . Breast cancer Paternal Aunt   . Esophageal cancer Neg Hx   . Liver cancer Neg Hx   . Pancreatic cancer Neg Hx   . Stomach cancer Neg Hx   . Rectal cancer Neg Hx     Social History   Tobacco Use  . Smoking status: Never Smoker  . Smokeless tobacco: Never Used  Vaping Use  . Vaping Use: Never used  Substance Use Topics  . Alcohol use: No    Alcohol/week: 0.0 standard drinks  . Drug use: Yes    Types: Marijuana    Home Medications Prior to Admission medications   Medication Sig Start Date End Date Taking? Authorizing Provider  aluminum-magnesium hydroxide-simethicone (MAALOX) 097-353-29 MG/5ML SUSP Take 30 mLs by mouth 3 (three) times daily as needed (indigestion).   Yes [provider]  busPIRone (BUSPAR) 5 MG tablet Take 1 tablet (5 mg total) by mouth 3 (three) times daily. 02/03/20  Yes [name], MD  carvedilol (COREG) 6.25 MG tablet Take 6.25 mg by mouth 2 (two) times daily with a meal.   Yes [provider]  ciprofloxacin (CIPRO) 500 MG tablet Take 1 tablet (500 mg total) by mouth 2 (two) times daily. 03/20/20  Yes Mesner, Corene Cornea, MD  cyclobenzaprine (FLEXERIL) 10 MG tablet TAKE 1 TABLET(10 MG) BY MOUTH THREE TIMES DAILY AS NEEDED FOR MUSCLE SPASMS Patient taking differently: Take 10 mg by mouth 3 (three) times daily as needed for muscle spasms. 03/07/20  Yes [name], MD  dicyclomine (BENTYL) 20 MG tablet Take 1 tablet (20 mg total) by mouth every 8 (eight) hours as needed for spasms. 11/25/19  Yes Petrucelli, Samantha R, PA-C  hydrALAZINE (APRESOLINE) 50 MG tablet TAKE 1  TABLET(50 MG) BY MOUTH EVERY 6 HOURS Patient taking differently: Take 50 mg by mouth every 6 (six) hours. 01/30/20  Yes [name], MD  Insulin NPH, Human,, Isophane, (HUMULIN N KWIKPEN) 100 UNIT/ML Kiwkpen Inject 20 Units into the skin every morning. 03/02/20  Yes Renato Shin, MD  isosorbide mononitrate (IMDUR) 30 MG 24 hr tablet Take 30 mg by mouth daily. 01/03/20  Yes [provider]  loperamide (IMODIUM) 2 MG capsule Take 6 mg by mouth daily as needed for diarrhea or loose stools.    Yes [provider]  LORazepam (ATIVAN) 0.5 MG tablet TAKE 1 TABLET(0.5 MG) BY MOUTH TWICE DAILY AS NEEDED FOR  ANXIETY Patient taking differently: Take 0.5 mg by mouth 2 (two) times daily as needed for anxiety. 03/07/20  Yes [name], MD  metoCLOPramide (REGLAN) 5 MG tablet Take 1 tablet (5 mg total) by mouth in the morning and at bedtime. Please schedule an Office visit for further refills. Thank you 01/30/20  Yes Armbruster, Carlota Raspberry, MD  mirtazapine (REMERON) 30 MG tablet TAKE 1 TABLET BY MOUTH AT  BEDTIME Patient taking differently: Take 30 mg by mouth at bedtime. 01/17/20  Yes [name], MD  ondansetron (ZOFRAN) 4 MG tablet Take 1 tablet (4 mg total) by mouth every 8 (eight) hours as needed for up to 20 doses for nausea or vomiting. 11/23/19  Yes Curatolo, Adam, DO  pantoprazole (PROTONIX) 40 MG tablet Take 1 tablet (40 mg total) by mouth 2 (two) times daily. 12/06/19  Yes Armbruster, Carlota Raspberry, MD  promethazine (PHENERGAN) 25 MG suppository Place 1 suppository (25 mg total) rectally every 6 (six) hours as needed for nausea or vomiting. 03/20/20  Yes Mesner, Corene Cornea, MD  promethazine (PHENERGAN) 25 MG tablet Take 1 tablet (25 mg total) by mouth every 6 (six) hours as needed for nausea or vomiting. 03/20/20  Yes Mesner, Corene Cornea, MD  scopolamine (TRANSDERM-SCOP) 1 MG/3DAYS Place 1 patch onto the skin every 3 (three) days.   Yes [provider]  sucralfate (CARAFATE) 1 GM/10ML  suspension Take 10 mLs (1 g total) by mouth every 6 (six) hours as needed. Patient taking differently: Take 1 g by mouth every 6 (six) hours as needed (stomach). 11/24/19  Yes Armbruster, Carlota Raspberry, MD  Continuous Blood Gluc Sensor (DEXCOM G6 SENSOR) MISC USE AS DIRECTED WITH CONTINUOUS BLOOD GLUCOSE MONITOR. REPLACE EVERY 10 DAYS 02/27/20   Renato Shin, MD  Continuous Blood Gluc Transmit (DEXCOM G6 TRANSMITTER) MISC USE AS DIRECTED TO  MONITOR  GLUCOSE  LEVELS 01/19/20   Renato Shin, MD  potassium chloride SA (KLOR-CON) 20 MEQ tablet Take 1 tablet (20 mEq total) by mouth daily for 5 doses. 07/04/19 07/30/19  Janeece Fitting, PA-C    Allergies    Other and Lactose intolerance (gi)  Review of Systems   Review of Systems  Gastrointestinal: Positive for nausea and vomiting.  Musculoskeletal: Positive for back pain.  All other systems reviewed and are negative.   Physical Exam Updated Vital Signs BP (!) 184/102   Pulse (!) 107   Temp 99 F (37.2 C) (Oral)   Resp 16   Ht 5' 7"  (1.702 m)   Wt 59 kg   SpO2 100%   BMI 20.36 kg/m   Physical Exam Vitals and nursing note reviewed.  Constitutional:      General: She is in acute distress.     Appearance: She is well-developed and well-nourished.  HENT:     Head: Normocephalic and atraumatic.  Eyes:     Conjunctiva/sclera: Conjunctivae normal.  Cardiovascular:     Rate and Rhythm: Regular rhythm. Tachycardia present.  Pulmonary:     Effort: Pulmonary effort is normal. No respiratory distress.     Breath sounds: Normal breath sounds.  Abdominal:     General: Bowel sounds are normal.     Palpations: Abdomen is soft.     Tenderness: There is abdominal tenderness in the epigastric area. There is no guarding or rebound.  Skin:    General: Skin is warm.  Neurological:     Mental Status: She is alert.  Psychiatric:        Mood and Affect:  Mood is anxious. Affect is tearful.        Behavior: Behavior is cooperative.     ED Results /  Procedures / Treatments   Labs (all labs ordered are listed, but only abnormal results are displayed) Labs Reviewed  CBC - Abnormal; Notable for the following components:      Result Value   RBC 3.68 (*)    Hemoglobin 10.3 (*)    HCT 31.5 (*)    All other components within normal limits  COMPREHENSIVE METABOLIC PANEL - Abnormal; Notable for the following components:   Chloride 81 (*)    Glucose, Bld 653 (*)    BUN 58 (*)    Creatinine, Ser 3.95 (*)    Calcium 8.4 (*)    Total Protein 8.9 (*)    GFR, Estimated 15 (*)    Anion gap 22 (*)    All other components within normal limits  BETA-HYDROXYBUTYRIC ACID - Abnormal; Notable for the following components:   Beta-Hydroxybutyric Acid 4.91 (*)    All other components within normal limits  BLOOD GAS, VENOUS - Abnormal; Notable for the following components:   Bicarbonate 32.9 (*)    Acid-Base Excess 7.3 (*)    All other components within normal limits  CBG MONITORING, ED - Abnormal; Notable for the following components:   Glucose-Capillary 562 (*)    All other components within normal limits  CBG MONITORING, ED - Abnormal; Notable for the following components:   Glucose-Capillary 561 (*)    All other components within normal limits  CBG MONITORING, ED - Abnormal; Notable for the following components:   Glucose-Capillary 463 (*)    All other components within normal limits  CBG MONITORING, ED - Abnormal; Notable for the following components:   Glucose-Capillary 359 (*)    All other components within normal limits  RESP PANEL BY RT-PCR (FLU A&B, COVID) ARPGX2  LIPASE, BLOOD  URINALYSIS, ROUTINE W REFLEX MICROSCOPIC  OSMOLALITY  I-STAT BETA HCG BLOOD, ED ([facility], [facility], [facility] ONLY)    EKG None  Radiology CT Angio Chest/Abd/Pel for Dissection W and/or Wo Contrast  Result Date: 03/20/2020 CLINICAL DATA:  Mid back pain. EXAM: CT ANGIOGRAPHY CHEST, ABDOMEN AND PELVIS TECHNIQUE: Non-contrast CT of the chest was initially obtained.  Multidetector CT imaging through the chest, abdomen and pelvis was performed using the standard protocol during bolus administration of intravenous contrast. Multiplanar reconstructed images and MIPs were obtained and reviewed to evaluate the vascular anatomy. CONTRAST:  74m OMNIPAQUE IOHEXOL 350 MG/ML SOLN COMPARISON:  February 13, 2020 FINDINGS: CTA CHEST FINDINGS Cardiovascular: Satisfactory opacification of the pulmonary arteries to the segmental level. No evidence of pulmonary embolism. Normal heart size. No pericardial effusion. Mediastinum/Nodes: No enlarged mediastinal, hilar, or axillary lymph nodes. Thyroid gland, trachea, and esophagus demonstrate no significant findings. Lungs/Pleura: A 3 mm calcified lung nodule is seen within the right upper lobe. There is no evidence of acute infiltrate, pleural effusion or pneumothorax. Musculoskeletal: No chest wall abnormality. No acute or significant osseous findings. Review of the MIP images confirms the above findings. CTA ABDOMEN AND PELVIS FINDINGS VASCULAR Aorta: Normal caliber aorta without aneurysm, dissection, vasculitis or significant stenosis. Celiac: Patent without evidence of aneurysm, dissection, vasculitis or significant stenosis. SMA: Patent without evidence of aneurysm, dissection, vasculitis or significant stenosis. Renals: Both renal arteries are patent without evidence of aneurysm, dissection, vasculitis, fibromuscular dysplasia or significant stenosis. IMA: Patent without evidence of aneurysm, dissection, vasculitis or significant stenosis. Inflow: Patent without evidence of aneurysm, dissection, vasculitis  or significant stenosis. Veins: No obvious venous abnormality within the limitations of this arterial phase study. Review of the MIP images confirms the above findings. NON-VASCULAR Hepatobiliary: There is diffuse fatty infiltration of the liver parenchyma. No focal liver abnormality is seen. Status post cholecystectomy. No biliary  dilatation. Pancreas: Unremarkable. No pancreatic ductal dilatation or surrounding inflammatory changes. Spleen: Normal in size without focal abnormality. Adrenals/Urinary Tract: Adrenal glands are unremarkable. Kidneys are normal, without renal calculi, focal lesion, or hydronephrosis. Bladder is unremarkable. Stomach/Bowel: There is a small hiatal hernia. Appendix appears normal. No evidence of bowel dilatation. Moderate to marked severity diffusely thickened and inflamed large bowel is seen. Lymphatic: No abnormal abdominal or pelvic lymph nodes are identified. Reproductive: Uterus and bilateral adnexa are unremarkable. Other: No abdominal wall hernia or abnormality. No abdominopelvic ascites. Musculoskeletal: No acute or significant osseous findings. Review of the MIP images confirms the above findings. IMPRESSION: 1. Moderate to marked severity diffuse colitis. 2. Fatty liver. 3. Evidence of prior cholecystectomy. Electronically Signed   By: Virgina Norfolk M.D.   On: 03/20/2020 02:32    Procedures .Critical Care Performed by: Shia Delaine, [place] N, PA-C Authorized by: Tezra Mahr, [place] N, PA-C   Critical care provider statement:    Critical care time (minutes):  45   Critical care time was exclusive of:  Separately billable procedures and treating other patients and teaching time   Critical care was necessary to treat or prevent imminent or life-threatening deterioration of the following conditions:  Endocrine crisis   Critical care was time spent personally by me on the following activities:  Discussions with consultants, evaluation of patient's response to treatment, examination of patient, ordering and performing treatments and interventions, ordering and review of laboratory studies, ordering and review of radiographic studies, pulse oximetry, re-evaluation of patient's condition, obtaining history from patient or surrogate and review of old charts   I assumed direction of critical care for this  patient from another provider in my specialty: no     (including critical care time)  Medications Ordered in ED Medications  insulin regular, human (MYXREDLIN) 100 units/ 100 mL infusion (5.5 Units/hr Intravenous New Bag/Given 03/21/20 2205)  lactated ringers infusion ( Intravenous New Bag/Given 03/21/20 2246)  dextrose 5 % in lactated ringers infusion (has no administration in time range)  dextrose 50 % solution 0-50 mL (has no administration in time range)  potassium chloride 10 mEq in 100 mL IVPB (10 mEq Intravenous New Bag/Given 03/21/20 2327)  lactated ringers bolus 1,180 mL (0 mL/kg  59 kg Intravenous Stopped 03/21/20 2323)  ondansetron (ZOFRAN) injection 4 mg (4 mg Intravenous Given 03/21/20 2133)  fentaNYL (SUBLIMAZE) injection 50 mcg (50 mcg Intravenous Given 03/21/20 2133)  fentaNYL (SUBLIMAZE) injection 50 mcg (50 mcg Intravenous Given 03/21/20 2253)    ED Course  I have reviewed the triage vital signs and the nursing notes.  Pertinent labs & imaging results that were available during my care of the patient were reviewed by me and considered in my medical decision making (see chart for details).    MDM Rules/Calculators/A&P                          Patient is a type I diabetic, presenting with nausea and vomiting began yesterday.  On arrival she is tachycardic and hypertensive.  On evaluation she is anxious and tearful.  She has some epigastric abdominal tenderness without guarding or rebound.  Her laboratory work-up reveals acute kidney injury with  creatinine of 3.95, up from 1.25 two days ago with a BUN of 58.  Blood glucose level on metabolic panel is 460, potassium 3.5.  She is also noted to have bicarb of 32 with elevated gap at 22.  pH on VBG is 7.39.  Beta hydroxybutyric acid is up at 4.9.  Believe her multiple episodes of vomiting since yesterday may have some effect on her higher pH.  She is treated with close stabilizer for DKA; insulin infusion, IV fluids,  antiemetics, pain medication.  She is admitted to the hospital service for further management of DKA.  Dr. Jonelle Sidle accepting admission.  Final Clinical Impression(s) / ED Diagnoses Final diagnoses:  Diabetic ketoacidosis without coma associated with type 1 diabetes mellitus ([place])  AKI (acute kidney injury) [facility])    Rx / DC Orders ED Discharge Orders    None       Eleftheria Taborn, [place] N, PA-C 03/22/20 0002    Lorelle Gibbs, DO 03/22/20 1602

## 2020-03-21 NOTE — ED Notes (Signed)
Pt IV came out while using the restroom.  Pt pulled the call bell in the restroom and there was a pool of blood and fluids on the ground.  Pt reports, "It just fell out".  Will continue to monitor.

## 2020-03-21 NOTE — ED Notes (Signed)
Date and time results received: 03/21/20 2059 (use smartphrase ".now" to insert current time)  Test: glucose  Critical Value: 653  Name of Provider Notified: Horton, MD   Orders Received? Or Actions Taken?: Actions Taken: Provider notified.

## 2020-03-22 ENCOUNTER — Encounter (HOSPITAL_COMMUNITY): Payer: Self-pay | Admitting: Internal Medicine

## 2020-03-22 DIAGNOSIS — F330 Major depressive disorder, recurrent, mild: Secondary | ICD-10-CM | POA: Diagnosis present

## 2020-03-22 DIAGNOSIS — F419 Anxiety disorder, unspecified: Secondary | ICD-10-CM

## 2020-03-22 DIAGNOSIS — E1110 Type 2 diabetes mellitus with ketoacidosis without coma: Secondary | ICD-10-CM | POA: Diagnosis present

## 2020-03-22 DIAGNOSIS — Z833 Family history of diabetes mellitus: Secondary | ICD-10-CM | POA: Diagnosis not present

## 2020-03-22 DIAGNOSIS — E43 Unspecified severe protein-calorie malnutrition: Secondary | ICD-10-CM | POA: Diagnosis present

## 2020-03-22 DIAGNOSIS — R1115 Cyclical vomiting syndrome unrelated to migraine: Secondary | ICD-10-CM | POA: Diagnosis not present

## 2020-03-22 DIAGNOSIS — K3184 Gastroparesis: Secondary | ICD-10-CM | POA: Diagnosis present

## 2020-03-22 DIAGNOSIS — Z79899 Other long term (current) drug therapy: Secondary | ICD-10-CM | POA: Diagnosis not present

## 2020-03-22 DIAGNOSIS — N183 Chronic kidney disease, stage 3 unspecified: Secondary | ICD-10-CM

## 2020-03-22 DIAGNOSIS — D649 Anemia, unspecified: Secondary | ICD-10-CM | POA: Diagnosis present

## 2020-03-22 DIAGNOSIS — Z794 Long term (current) use of insulin: Secondary | ICD-10-CM | POA: Diagnosis not present

## 2020-03-22 DIAGNOSIS — E1022 Type 1 diabetes mellitus with diabetic chronic kidney disease: Secondary | ICD-10-CM

## 2020-03-22 DIAGNOSIS — E1159 Type 2 diabetes mellitus with other circulatory complications: Secondary | ICD-10-CM | POA: Diagnosis not present

## 2020-03-22 DIAGNOSIS — E1010 Type 1 diabetes mellitus with ketoacidosis without coma: Secondary | ICD-10-CM | POA: Diagnosis present

## 2020-03-22 DIAGNOSIS — M797 Fibromyalgia: Secondary | ICD-10-CM | POA: Diagnosis present

## 2020-03-22 DIAGNOSIS — K219 Gastro-esophageal reflux disease without esophagitis: Secondary | ICD-10-CM | POA: Diagnosis present

## 2020-03-22 DIAGNOSIS — Z20822 Contact with and (suspected) exposure to covid-19: Secondary | ICD-10-CM | POA: Diagnosis present

## 2020-03-22 DIAGNOSIS — M546 Pain in thoracic spine: Secondary | ICD-10-CM | POA: Diagnosis present

## 2020-03-22 DIAGNOSIS — Z803 Family history of malignant neoplasm of breast: Secondary | ICD-10-CM | POA: Diagnosis not present

## 2020-03-22 DIAGNOSIS — Z6820 Body mass index (BMI) 20.0-20.9, adult: Secondary | ICD-10-CM | POA: Diagnosis not present

## 2020-03-22 DIAGNOSIS — N179 Acute kidney failure, unspecified: Secondary | ICD-10-CM | POA: Diagnosis present

## 2020-03-22 DIAGNOSIS — Z8616 Personal history of COVID-19: Secondary | ICD-10-CM | POA: Diagnosis not present

## 2020-03-22 DIAGNOSIS — I10 Essential (primary) hypertension: Secondary | ICD-10-CM | POA: Diagnosis present

## 2020-03-22 DIAGNOSIS — Z8249 Family history of ischemic heart disease and other diseases of the circulatory system: Secondary | ICD-10-CM | POA: Diagnosis not present

## 2020-03-22 DIAGNOSIS — E1043 Type 1 diabetes mellitus with diabetic autonomic (poly)neuropathy: Secondary | ICD-10-CM | POA: Diagnosis present

## 2020-03-22 DIAGNOSIS — E860 Dehydration: Secondary | ICD-10-CM | POA: Diagnosis present

## 2020-03-22 DIAGNOSIS — E739 Lactose intolerance, unspecified: Secondary | ICD-10-CM | POA: Diagnosis present

## 2020-03-22 LAB — CBC
HCT: 26.5 % — ABNORMAL LOW (ref 36.0–46.0)
Hemoglobin: 8.9 g/dL — ABNORMAL LOW (ref 12.0–15.0)
MCH: 27.8 pg (ref 26.0–34.0)
MCHC: 33.6 g/dL (ref 30.0–36.0)
MCV: 82.8 fL (ref 80.0–100.0)
Platelets: 254 10*3/uL (ref 150–400)
RBC: 3.2 MIL/uL — ABNORMAL LOW (ref 3.87–5.11)
RDW: 12.6 % (ref 11.5–15.5)
WBC: 8.4 10*3/uL (ref 4.0–10.5)
nRBC: 0 % (ref 0.0–0.2)

## 2020-03-22 LAB — BASIC METABOLIC PANEL
Anion gap: 17 — ABNORMAL HIGH (ref 5–15)
Anion gap: 17 — ABNORMAL HIGH (ref 5–15)
Anion gap: 20 — ABNORMAL HIGH (ref 5–15)
Anion gap: 14 (ref 5–15)
Anion gap: 14 (ref 5–15)
Anion gap: 12 (ref 5–15)
BUN: 61 mg/dL — ABNORMAL HIGH (ref 6–20)
BUN: 59 mg/dL — ABNORMAL HIGH (ref 6–20)
BUN: 55 mg/dL — ABNORMAL HIGH (ref 6–20)
BUN: 57 mg/dL — ABNORMAL HIGH (ref 6–20)
BUN: 54 mg/dL — ABNORMAL HIGH (ref 6–20)
BUN: 50 mg/dL — ABNORMAL HIGH (ref 6–20)
CO2: 36 mmol/L — ABNORMAL HIGH (ref 22–32)
CO2: 36 mmol/L — ABNORMAL HIGH (ref 22–32)
CO2: 31 mmol/L (ref 22–32)
CO2: 34 mmol/L — ABNORMAL HIGH (ref 22–32)
CO2: 31 mmol/L (ref 22–32)
CO2: 30 mmol/L (ref 22–32)
Calcium: 8 mg/dL — ABNORMAL LOW (ref 8.9–10.3)
Calcium: 8 mg/dL — ABNORMAL LOW (ref 8.9–10.3)
Calcium: 7.7 mg/dL — ABNORMAL LOW (ref 8.9–10.3)
Calcium: 7.6 mg/dL — ABNORMAL LOW (ref 8.9–10.3)
Calcium: 7.4 mg/dL — ABNORMAL LOW (ref 8.9–10.3)
Calcium: 7.3 mg/dL — ABNORMAL LOW (ref 8.9–10.3)
Chloride: 86 mmol/L — ABNORMAL LOW (ref 98–111)
Chloride: 86 mmol/L — ABNORMAL LOW (ref 98–111)
Chloride: 85 mmol/L — ABNORMAL LOW (ref 98–111)
Chloride: 88 mmol/L — ABNORMAL LOW (ref 98–111)
Chloride: 89 mmol/L — ABNORMAL LOW (ref 98–111)
Chloride: 90 mmol/L — ABNORMAL LOW (ref 98–111)
Creatinine, Ser: 4.05 mg/dL — ABNORMAL HIGH (ref 0.44–1.00)
Creatinine, Ser: 4.15 mg/dL — ABNORMAL HIGH (ref 0.44–1.00)
Creatinine, Ser: 3.75 mg/dL — ABNORMAL HIGH (ref 0.44–1.00)
Creatinine, Ser: 3.72 mg/dL — ABNORMAL HIGH (ref 0.44–1.00)
Creatinine, Ser: 4.09 mg/dL — ABNORMAL HIGH (ref 0.44–1.00)
Creatinine, Ser: 3.69 mg/dL — ABNORMAL HIGH (ref 0.44–1.00)
GFR, Estimated: 15 mL/min — ABNORMAL LOW (ref >60)
GFR, Estimated: 14 mL/min — ABNORMAL LOW (ref >60)
GFR, Estimated: 16 mL/min — ABNORMAL LOW (ref >60)
GFR, Estimated: 16 mL/min — ABNORMAL LOW (ref >60)
GFR, Estimated: 14 mL/min — ABNORMAL LOW (ref >60)
GFR, Estimated: 16 mL/min — ABNORMAL LOW (ref >60)
Glucose, Bld: 201 mg/dL — ABNORMAL HIGH (ref 70–99)
Glucose, Bld: 204 mg/dL — ABNORMAL HIGH (ref 70–99)
Glucose, Bld: 232 mg/dL — ABNORMAL HIGH (ref 70–99)
Glucose, Bld: 233 mg/dL — ABNORMAL HIGH (ref 70–99)
Glucose, Bld: 297 mg/dL — ABNORMAL HIGH (ref 70–99)
Glucose, Bld: 236 mg/dL — ABNORMAL HIGH (ref 70–99)
Potassium: 3.1 mmol/L — ABNORMAL LOW (ref 3.5–5.1)
Potassium: 3.1 mmol/L — ABNORMAL LOW (ref 3.5–5.1)
Potassium: 3.1 mmol/L — ABNORMAL LOW (ref 3.5–5.1)
Potassium: 2.9 mmol/L — ABNORMAL LOW (ref 3.5–5.1)
Potassium: 3.9 mmol/L (ref 3.5–5.1)
Potassium: 4.1 mmol/L (ref 3.5–5.1)
Sodium: 139 mmol/L (ref 135–145)
Sodium: 139 mmol/L (ref 135–145)
Sodium: 136 mmol/L (ref 135–145)
Sodium: 136 mmol/L (ref 135–145)
Sodium: 134 mmol/L — ABNORMAL LOW (ref 135–145)
Sodium: 132 mmol/L — ABNORMAL LOW (ref 135–145)

## 2020-03-22 LAB — GLUCOSE, CAPILLARY
Glucose-Capillary: 153 mg/dL — ABNORMAL HIGH (ref 70–99)
Glucose-Capillary: 205 mg/dL — ABNORMAL HIGH (ref 70–99)
Glucose-Capillary: 188 mg/dL — ABNORMAL HIGH (ref 70–99)
Glucose-Capillary: 207 mg/dL — ABNORMAL HIGH (ref 70–99)
Glucose-Capillary: 230 mg/dL — ABNORMAL HIGH (ref 70–99)
Glucose-Capillary: 200 mg/dL — ABNORMAL HIGH (ref 70–99)
Glucose-Capillary: 218 mg/dL — ABNORMAL HIGH (ref 70–99)
Glucose-Capillary: 230 mg/dL — ABNORMAL HIGH (ref 70–99)
Glucose-Capillary: 241 mg/dL — ABNORMAL HIGH (ref 70–99)
Glucose-Capillary: 280 mg/dL — ABNORMAL HIGH (ref 70–99)
Glucose-Capillary: 279 mg/dL — ABNORMAL HIGH (ref 70–99)
Glucose-Capillary: 304 mg/dL — ABNORMAL HIGH (ref 70–99)
Glucose-Capillary: 250 mg/dL — ABNORMAL HIGH (ref 70–99)
Glucose-Capillary: 220 mg/dL — ABNORMAL HIGH (ref 70–99)
Glucose-Capillary: 181 mg/dL — ABNORMAL HIGH (ref 70–99)
Glucose-Capillary: 154 mg/dL — ABNORMAL HIGH (ref 70–99)

## 2020-03-22 LAB — URINALYSIS, ROUTINE W REFLEX MICROSCOPIC
Bilirubin Urine: NEGATIVE
Glucose, UA: >=500 mg/dL — AB
Ketones, ur: 5 mg/dL — AB
Leukocytes,Ua: NEGATIVE
Nitrite: NEGATIVE
Protein, ur: >=300 mg/dL — AB
Specific Gravity, Urine: 1.022 (ref 1.005–1.030)
pH: 6 (ref 5.0–8.0)

## 2020-03-22 LAB — RAPID URINE DRUG SCREEN, HOSP PERFORMED
Amphetamines: NOT DETECTED
Barbiturates: NOT DETECTED
Benzodiazepines: NOT DETECTED
Cocaine: NOT DETECTED
Opiates: NOT DETECTED
Tetrahydrocannabinol: POSITIVE — AB

## 2020-03-22 LAB — BETA-HYDROXYBUTYRIC ACID
Beta-Hydroxybutyric Acid: 0.54 mmol/L — ABNORMAL HIGH (ref 0.05–0.27)
Beta-Hydroxybutyric Acid: 3.11 mmol/L — ABNORMAL HIGH (ref 0.05–0.27)
Beta-Hydroxybutyric Acid: 0.09 mmol/L (ref 0.05–0.27)

## 2020-03-22 LAB — MRSA PCR SCREENING: MRSA by PCR: NEGATIVE

## 2020-03-22 LAB — MAGNESIUM: Magnesium: 1.8 mg/dL (ref 1.7–2.4)

## 2020-03-22 LAB — RESP PANEL BY RT-PCR (FLU A&B, COVID) ARPGX2
Influenza A by PCR: NEGATIVE
Influenza B by PCR: NEGATIVE
SARS Coronavirus 2 by RT PCR: NEGATIVE

## 2020-03-22 LAB — CBG MONITORING, ED
Glucose-Capillary: 188 mg/dL — ABNORMAL HIGH (ref 70–99)
Glucose-Capillary: 188 mg/dL — ABNORMAL HIGH (ref 70–99)
Glucose-Capillary: 195 mg/dL — ABNORMAL HIGH (ref 70–99)
Glucose-Capillary: 201 mg/dL — ABNORMAL HIGH (ref 70–99)
Glucose-Capillary: 221 mg/dL — ABNORMAL HIGH (ref 70–99)

## 2020-03-22 LAB — PHOSPHORUS: Phosphorus: 4.8 mg/dL — ABNORMAL HIGH (ref 2.5–4.6)

## 2020-03-22 LAB — OSMOLALITY: Osmolality: 340 mOsm/kg (ref 275–295)

## 2020-03-22 MED ORDER — HYDRALAZINE HCL 50 MG PO TABS
50.0000 mg | ORAL_TABLET | Freq: Four times a day (QID) | ORAL | Status: DC
  Administered 2020-03-22 – 2020-03-24 (×8): 50 mg via ORAL
  Filled 2020-03-22 (×9): qty 1

## 2020-03-22 MED ORDER — CHLORHEXIDINE GLUCONATE 0.12 % MT SOLN
15.0000 mL | Freq: Two times a day (BID) | OROMUCOSAL | Status: DC
  Administered 2020-03-22 – 2020-03-24 (×3): 15 mL via OROMUCOSAL
  Filled 2020-03-22 (×4): qty 15

## 2020-03-22 MED ORDER — ACETAMINOPHEN 325 MG PO TABS
650.0000 mg | ORAL_TABLET | Freq: Four times a day (QID) | ORAL | Status: DC | PRN
  Administered 2020-03-22: 650 mg via ORAL
  Filled 2020-03-22: qty 2

## 2020-03-22 MED ORDER — LACTATED RINGERS IV BOLUS: 20.0000 mL/kg | Freq: Once | INTRAVENOUS | Status: DC

## 2020-03-22 MED ORDER — ORAL CARE MOUTH RINSE
15.0000 mL | Freq: Two times a day (BID) | OROMUCOSAL | Status: DC
  Administered 2020-03-22 – 2020-03-23 (×3): 15 mL via OROMUCOSAL

## 2020-03-22 MED ORDER — CARVEDILOL 6.25 MG PO TABS
6.2500 mg | ORAL_TABLET | Freq: Two times a day (BID) | ORAL | Status: DC
  Administered 2020-03-22 – 2020-03-24 (×4): 6.25 mg via ORAL
  Filled 2020-03-22 (×5): qty 1

## 2020-03-22 MED ORDER — ISOSORBIDE MONONITRATE ER 30 MG PO TB24
30.0000 mg | ORAL_TABLET | Freq: Every day | ORAL | Status: DC
  Administered 2020-03-22 – 2020-03-24 (×3): 30 mg via ORAL
  Filled 2020-03-22 (×3): qty 1

## 2020-03-22 MED ORDER — KCL IN DEXTROSE-NACL 40-5-0.9 MEQ/L-%-% IV SOLN
INTRAVENOUS | Status: DC
  Filled 2020-03-22: qty 1000

## 2020-03-22 MED ORDER — METOCLOPRAMIDE HCL 5 MG/ML IJ SOLN
5.0000 mg | Freq: Three times a day (TID) | INTRAMUSCULAR | Status: DC
  Administered 2020-03-22 – 2020-03-23 (×4): 5 mg via INTRAVENOUS
  Filled 2020-03-22 (×4): qty 2

## 2020-03-22 MED ORDER — LIDOCAINE 5 % EX PTCH
1.0000 | MEDICATED_PATCH | CUTANEOUS | Status: DC
  Administered 2020-03-22 – 2020-03-23 (×2): 1 via TRANSDERMAL
  Filled 2020-03-22 (×3): qty 1

## 2020-03-22 MED ORDER — INSULIN REGULAR(HUMAN) IN NACL 100-0.9 UT/100ML-% IV SOLN: INTRAVENOUS | Status: DC

## 2020-03-22 MED ORDER — ONDANSETRON HCL 4 MG/2ML IJ SOLN
4.0000 mg | Freq: Four times a day (QID) | INTRAMUSCULAR | Status: DC | PRN
  Administered 2020-03-22: 4 mg via INTRAVENOUS
  Filled 2020-03-22: qty 2

## 2020-03-22 MED ORDER — DICLOFENAC SODIUM 1 % EX GEL
2.0000 g | Freq: Four times a day (QID) | CUTANEOUS | Status: DC
  Administered 2020-03-22 – 2020-03-23 (×7): 2 g via TOPICAL
  Filled 2020-03-22: qty 100

## 2020-03-22 MED ORDER — LACTATED RINGERS IV SOLN: INTRAVENOUS | Status: DC

## 2020-03-22 MED ORDER — DEXTROSE 50 % IV SOLN: 0.0000 mL | INTRAVENOUS | Status: DC | PRN

## 2020-03-22 MED ORDER — POTASSIUM CHLORIDE IN NACL 40-0.9 MEQ/L-% IV SOLN
INTRAVENOUS | Status: DC
  Filled 2020-03-22 (×3): qty 1000

## 2020-03-22 MED ORDER — POTASSIUM CHLORIDE 10 MEQ/100ML IV SOLN: 10.0000 meq | INTRAVENOUS | Status: AC

## 2020-03-22 MED ORDER — CHLORHEXIDINE GLUCONATE CLOTH 2 % EX PADS
6.0000 | MEDICATED_PAD | Freq: Every day | CUTANEOUS | Status: DC
  Administered 2020-03-23 – 2020-03-24 (×2): 6 via TOPICAL

## 2020-03-22 MED ORDER — ENOXAPARIN SODIUM 30 MG/0.3ML ~~LOC~~ SOLN
30.0000 mg | SUBCUTANEOUS | Status: DC
  Administered 2020-03-23 – 2020-03-24 (×2): 30 mg via SUBCUTANEOUS
  Filled 2020-03-22 (×2): qty 0.3

## 2020-03-22 MED ORDER — METOPROLOL TARTRATE 5 MG/5ML IV SOLN
5.0000 mg | Freq: Four times a day (QID) | INTRAVENOUS | Status: DC | PRN
  Administered 2020-03-22: 5 mg via INTRAVENOUS
  Filled 2020-03-22: qty 5

## 2020-03-22 MED ORDER — KETOROLAC TROMETHAMINE 15 MG/ML IJ SOLN
15.0000 mg | Freq: Four times a day (QID) | INTRAMUSCULAR | Status: DC | PRN
  Administered 2020-03-22: 15 mg via INTRAVENOUS
  Filled 2020-03-22: qty 1

## 2020-03-22 MED ORDER — PANTOPRAZOLE SODIUM 40 MG IV SOLR
40.0000 mg | Freq: Two times a day (BID) | INTRAVENOUS | Status: AC
  Administered 2020-03-22 – 2020-03-23 (×3): 40 mg via INTRAVENOUS
  Filled 2020-03-22 (×2): qty 40

## 2020-03-22 MED ORDER — DEXTROSE IN LACTATED RINGERS 5 % IV SOLN: INTRAVENOUS | Status: DC

## 2020-03-22 MED ORDER — FENTANYL CITRATE (PF) 100 MCG/2ML IJ SOLN: 12.5000 ug | INTRAMUSCULAR | Status: DC | PRN

## 2020-03-22 MED ORDER — POTASSIUM CHLORIDE 10 MEQ/100ML IV SOLN
10.0000 meq | INTRAVENOUS | Status: AC
  Administered 2020-03-22 (×4): 10 meq via INTRAVENOUS
  Filled 2020-03-22 (×4): qty 100

## 2020-03-22 MED ORDER — INSULIN GLARGINE 100 UNIT/ML ~~LOC~~ SOLN
10.0000 [IU] | Freq: Every day | SUBCUTANEOUS | Status: DC
  Administered 2020-03-23 (×2): 10 [IU] via SUBCUTANEOUS
  Filled 2020-03-22 (×2): qty 0.1

## 2020-03-22 MED ORDER — INSULIN ASPART 100 UNIT/ML ~~LOC~~ SOLN: 0.0000 [IU] | Freq: Three times a day (TID) | SUBCUTANEOUS | Status: DC

## 2020-03-22 MED ORDER — POTASSIUM CHLORIDE 10 MEQ/100ML IV SOLN
10.0000 meq | INTRAVENOUS | Status: AC
Start: 2020-03-22 — End: 2020-03-22
  Administered 2020-03-22 (×6): 10 meq via INTRAVENOUS
  Filled 2020-03-22 (×6): qty 100

## 2020-03-22 MED ORDER — POTASSIUM CHLORIDE 2 MEQ/ML IV SOLN
INTRAVENOUS | Status: DC
  Filled 2020-03-22: qty 1000

## 2020-03-22 NOTE — TOC Initial Note (Addendum)
Transition of Care [facility]) - Initial/Assessment Note    Patient Details  Name: [name] MRN: [id number] Date of Birth: 1990/07/04  Transition of Care [facility]) CM/SW Contact:    Dessa Phi, RN Phone Number: 03/22/2020, 2:36 PM  Clinical Narrative: d/c plan home.continue to follow-high risk.                   Expected Discharge Plan: Home/Self Care Barriers to Discharge: Continued Medical Work up   Patient Goals and CMS Choice Patient states their goals for this hospitalization and ongoing recovery are:: go home      Expected Discharge Plan and Services Expected Discharge Plan: Home/Self Care   Discharge Planning Services: CM Consult   Living arrangements for the past 2 months: Single Family Home                                      Prior Living Arrangements/Services Living arrangements for the past 2 months: Single Family Home Lives with:: Spouse Patient language and need for interpreter reviewed:: Yes Do you feel safe going back to the place where you live?: Yes      Need for Family Participation in Patient Care: No (Comment) Care giver support system in place?: Yes (comment)   Criminal Activity/Legal Involvement Pertinent to Current Situation/Hospitalization: No - Comment as needed  Activities of Daily Living Home Assistive Devices/Equipment: Dentures (specify type),CBG Meter (upper/lower dentures) ADL Screening (condition at time of admission) Patient's cognitive ability adequate to safely complete daily activities?: Yes Is the patient deaf or have difficulty hearing?: No Does the patient have difficulty seeing, even when wearing glasses/contacts?: No Does the patient have difficulty concentrating, remembering, or making decisions?: No Patient able to express need for assistance with ADLs?: Yes Does the patient have difficulty dressing or bathing?: No Independently performs ADLs?: Yes (appropriate for developmental age) Does the patient have  difficulty walking or climbing stairs?: Yes (secondary to back pain) Weakness of Legs: Both Weakness of Arms/Hands: None  Permission Sought/Granted Permission sought to share information with : Case Manager Permission granted to share information with : Yes, Verbal Permission Granted  Share Information with NAME: Case Manager           Emotional Assessment Appearance:: Appears stated age Attitude/Demeanor/Rapport: Gracious Affect (typically observed): Accepting Orientation: : Oriented to Self,Oriented to Place,Oriented to  Time,Oriented to Situation Alcohol / Substance Use: Illicit Drugs Psych Involvement: No (comment)  Admission diagnosis:  DKA (diabetic ketoacidosis) ([place]) [E11.10] AKI (acute kidney injury) ([place]) [N17.9] Diabetic ketoacidosis without coma associated with type 1 diabetes mellitus ([place]) [E10.10] Patient Active Problem List   Diagnosis Date Noted  . DKA (diabetic ketoacidosis) ([place]) 03/22/2020  . 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 Hypertensive urgency 04/30/2019  . Cannabinoid hyperemesis syndrome 04/30/2019  . Contraception management 08/06/2018  . Cyclical vomiting syndrome 06/07/2018  . Diabetic gastroparesis associated with type 1 diabetes mellitus ([place]) 06/04/2018  . Anxiety 05/31/2018  . Peripheral edema 05/31/2018  . Normocytic anemia 07/10/2017  . GERD (gastroesophageal reflux disease) 06/23/2017  . Intractable nausea and vomiting 04/11/2017  . MDD (major depressive disorder), recurrent episode, mild ([place]) 02/28/2017  . Diabetes mellitus type 1 ([place]) 04/28/2013  . Protein-calorie malnutrition, severe ([place]) 04/28/2013  . Hypertension associated with diabetes ([place]) 11/14/2011  . Chronic abdominal pain 09/18/2011   PCP:  [name], MD Pharmacy:   [id number] [address] [place] [address] Phone: [phone]  Fax: [phone]  [place], [address], [address] [place], [place] [address] Phone: [phone] Fax: [place], [address] [place] [place] [address] Phone: [phone] Fax: [phone]     Social Determinants of Health (SDOH) Interventions    Readmission Risk Interventions Readmission Risk Prevention Plan 03/22/2020 09/26/2019 07/30/2018  Transportation Screening Complete Complete Complete  PCP or Specialist Appt within 3-5 Days - - Complete  HRI or Home Care Consult - - Not Complete  HRI or Home Care Consult comments - - NA  Social Work Consult for [place] Planning/Counseling - - Not Complete  SW consult not completed comments - - NA  Palliative Care Screening - - Not Applicable  Medication Review Press photographer) Complete - (No Data)  PCP or Specialist appointment within 3-5 days of discharge Complete Complete -  [place] or Home Care Consult Complete Complete -  SW Recovery Care/Counseling Consult Complete Complete -  Palliative Care Screening Not Applicable Not Applicable -  [place] Not Applicable Not Applicable -  Some recent data might be hidden

## 2020-03-22 NOTE — Progress Notes (Addendum)
Inpatient Diabetes Program Recommendations  AACE/ADA: New Consensus Statement on Inpatient Glycemic Control (2015)  Target Ranges:  Prepandial:   less than 140 mg/dL      Peak postprandial:   less than 180 mg/dL (1-2 hours)      Critically ill patients:  140 - 180 mg/dL   Lab Results  Component Value Date   GLUCAP 205 (H) 03/22/2020   HGBA1C 9.0 (A) 02/29/2020    Review of Glycemic Control Results for [name], [name] (MRN [id number]) as of 03/22/2020 10:03  Ref. Range 03/22/2020 04:53 03/22/2020 05:55 03/22/2020 07:00 03/22/2020 08:01  Glucose-Capillary Latest Ref Range: 70 - 99 mg/dL 188 (H) 188 (H) 153 (H) 205 (H)   Diabetes history: DM 1 Outpatient Diabetes medications:  NPH 20 units q AM Current orders for Inpatient glycemic control:  IV insulin-DKA order set Inpatient Diabetes Program Recommendations:   Once acidosis cleared, consider Lantus 6 units 2 hours prior to d/c of insulin drip.  Also consider adding Novolog very sensitive q 4 hours.    Thanks,  Adah Perl, RN, BC-ADM Inpatient Diabetes Coordinator Pager [phone] (8a-5p)  [phone]- Attempted to call patient. Of note patient has had 13 ED visits since September and 3 hospital admits.  Currently only ordered to be on one shot a day of NPH by endocrinology.  See recs. Above- may be safer due to tendency toward N&V and patients variable intake.  Patient was a no show to her endocrinology visit on 03/14/20.

## 2020-03-22 NOTE — H&P (Signed)
History and Physical   [name]:[id number] DOB: 1990-05-21 DOA: 03/21/2020  Referring MD/NP/PA: Dr. Roslynn Amble  PCP: [name], MD   Outpatient Specialists: None  Patient coming from: Home  Chief Complaint: Nausea vomiting abdominal pain  HPI: [name] is a 29 y.o. female with medical history significant of type 1 diabetes, fibromyalgia, GERD, hypertension, anemia, gastroparesis, cyclical vomiting syndrome, severe protein calorie malnutrition, who presents to the ER with intractable nausea vomiting. Has not been using her insulin lately. Patient seen and evaluated. She appears to have significant hyperglycemia. She also has some gap acidosis with an elevated beta hydroxy acid. Her pH is showing normal with suspected mixed acidosis and alkalosis from persistent vomiting. Patient appears to have DKA based on clinical criteria. She has severe protein calorie malnutrition and has cyclical vomiting syndrome is persistent. She'll be admitted for management of DKA as well as cyclical vomiting syndrome leading to compensated alkalosis..  ED Course: Temperature is 99 blood pressure 190/111 pulse 150 respirate of 21 oxygen sat 97% on room air. pH is 7.391 venous. Sodium 135 potassium 3.6 chloride 81 CO2 32 glucose 653. BUN of 58 creatinine 3.95. Her gap is 22. CBC largely within normal except for hemoglobin 10.3. Respiratory panel is negative. Urinalysis showed moderate glucosuria and hemoglobin. Ketones positive at 20. Beta hydroxybutyrate is 4.91. Patient will be admitted with diagnosis of DKA and also metabolic alkalosis secondary to persistent vomiting.  Review of Systems: As per HPI otherwise 10 point review of systems negative.    Past Medical History:  Diagnosis Date  . Allergy   . Anemia   . Anxiety   . Blood transfusion without reported diagnosis    Dec 2018  . Cataract    right eye  . COVID-19   . Depression   . Diabetes type 1, uncontrolled ([place]) 11/14/2011   Since  age 25  . Fibromyalgia   . Gastroparesis   . GERD (gastroesophageal reflux disease)   . Hypertension   . Infection    UTI April 2016    Past Surgical History:  Procedure Laterality Date  . ANKLE SURGERY    . CHOLECYSTECTOMY  11/15/2011   Procedure: LAPAROSCOPIC CHOLECYSTECTOMY WITH INTRAOPERATIVE CHOLANGIOGRAM;  Surgeon: [name], MD;  Location: [facility];  Service: General;  Laterality: N/A;  . COLONOSCOPY    . COLONOSCOPY WITH PROPOFOL N/A 06/27/2017   Procedure: COLONOSCOPY WITH PROPOFOL;  Surgeon: Milus Banister, MD;  Location: [facility];  Service: Endoscopy;  Laterality: N/A;  . ESOPHAGOGASTRODUODENOSCOPY  12/03/2011   Procedure: ESOPHAGOGASTRODUODENOSCOPY (EGD);  Surgeon: Beryle Beams, MD;  Location: [facility];  Service: Endoscopy;  Laterality: N/A;  . FLEXIBLE SIGMOIDOSCOPY N/A 03/10/2017   Procedure: FLEXIBLE SIGMOIDOSCOPY;  Surgeon: Carol Ada, MD;  Location: [facility];  Service: Endoscopy;  Laterality: N/A;  . INCISION AND DRAINAGE PERIRECTAL ABSCESS N/A 03/01/2017   Procedure: IRRIGATION AND DEBRIDEMENT PERIRECTAL ABSCESS;  Surgeon: Alphonsa Overall, MD;  Location: [facility];  Service: General;  Laterality: N/A;  . IRRIGATION AND DEBRIDEMENT BUTTOCKS N/A 03/23/2017   Procedure: IRRIGATION AND DEBRIDEMENT BUTTOCKS, SETON PLACEMENT;  Surgeon: [name], MD;  Location: [facility];  Service: General;  Laterality: N/A;  . LAPAROSCOPY  11/23/2011   Procedure: LAPAROSCOPY DIAGNOSTIC;  Surgeon: Edward Jolly, MD;  Location: [facility];  Service: General;  Laterality: N/A;  . SIGMOIDOSCOPY    . UPPER GASTROINTESTINAL ENDOSCOPY    . WISDOM TOOTH EXTRACTION       reports that she has  never smoked. She has never used smokeless tobacco. She reports current drug use. Drug: Marijuana. She reports that she does not drink alcohol.  Allergies  Allergen Reactions  . Other Anaphylaxis    Reaction to [place] nuts   . Lactose Intolerance (Gi) Diarrhea    Family History   Problem Relation Age of Onset  . Diabetes Mother   . Hypertension Father   . Colon cancer Paternal Grandmother        pt thinks PGM was dx in her 50's  . Diabetes Paternal Grandmother   . Diabetes Maternal Grandmother   . Diabetes Maternal Grandfather   . Diabetes Paternal Grandfather   . Diabetes Other   . Breast cancer Paternal Aunt   . Esophageal cancer Neg Hx   . Liver cancer Neg Hx   . Pancreatic cancer Neg Hx   . Stomach cancer Neg Hx   . Rectal cancer Neg Hx      Prior to Admission medications   Medication Sig Start Date End Date Taking? Authorizing Provider  aluminum-magnesium hydroxide-simethicone (MAALOX) [phone] MG/5ML SUSP Take 30 mLs by mouth 3 (three) times daily as needed (indigestion).   Yes [provider]  busPIRone (BUSPAR) 5 MG tablet Take 1 tablet (5 mg total) by mouth 3 (three) times daily. 02/03/20  Yes [name], MD  carvedilol (COREG) 6.25 MG tablet Take 6.25 mg by mouth 2 (two) times daily with a meal.   Yes [provider]  ciprofloxacin (CIPRO) 500 MG tablet Take 1 tablet (500 mg total) by mouth 2 (two) times daily. 03/20/20  Yes Mesner, Corene Cornea, MD  cyclobenzaprine (FLEXERIL) 10 MG tablet TAKE 1 TABLET(10 MG) BY MOUTH THREE TIMES DAILY AS NEEDED FOR MUSCLE SPASMS Patient taking differently: Take 10 mg by mouth 3 (three) times daily as needed for muscle spasms. 03/07/20  Yes [name], MD  dicyclomine (BENTYL) 20 MG tablet Take 1 tablet (20 mg total) by mouth every 8 (eight) hours as needed for spasms. 11/25/19  Yes Petrucelli, Samantha R, PA-C  hydrALAZINE (APRESOLINE) 50 MG tablet TAKE 1 TABLET(50 MG) BY MOUTH EVERY 6 HOURS Patient taking differently: Take 50 mg by mouth every 6 (six) hours. 01/30/20  Yes [name], MD  Insulin NPH, Human,, Isophane, (HUMULIN N KWIKPEN) 100 UNIT/ML Kiwkpen Inject 20 Units into the skin every morning. 03/02/20  Yes Renato Shin, MD  isosorbide mononitrate (IMDUR) 30 MG 24 hr tablet Take 30  mg by mouth daily. 01/03/20  Yes [provider]  loperamide (IMODIUM) 2 MG capsule Take 6 mg by mouth daily as needed for diarrhea or loose stools.    Yes [provider]  LORazepam (ATIVAN) 0.5 MG tablet TAKE 1 TABLET(0.5 MG) BY MOUTH TWICE DAILY AS NEEDED FOR ANXIETY Patient taking differently: Take 0.5 mg by mouth 2 (two) times daily as needed for anxiety. 03/07/20  Yes [name], MD  metoCLOPramide (REGLAN) 5 MG tablet Take 1 tablet (5 mg total) by mouth in the morning and at bedtime. Please schedule an Office visit for further refills. Thank you 01/30/20  Yes Armbruster, Carlota Raspberry, MD  mirtazapine (REMERON) 30 MG tablet TAKE 1 TABLET BY MOUTH AT  BEDTIME Patient taking differently: Take 30 mg by mouth at bedtime. 01/17/20  Yes [name], MD  ondansetron (ZOFRAN) 4 MG tablet Take 1 tablet (4 mg total) by mouth every 8 (eight) hours as needed for up to 20 doses for nausea or vomiting. 11/23/19  Yes Lennice Sites,  DO  pantoprazole (PROTONIX) 40 MG tablet Take 1 tablet (40 mg total) by mouth 2 (two) times daily. 12/06/19  Yes Armbruster, Carlota Raspberry, MD  promethazine (PHENERGAN) 25 MG suppository Place 1 suppository (25 mg total) rectally every 6 (six) hours as needed for nausea or vomiting. 03/20/20  Yes Mesner, Corene Cornea, MD  promethazine (PHENERGAN) 25 MG tablet Take 1 tablet (25 mg total) by mouth every 6 (six) hours as needed for nausea or vomiting. 03/20/20  Yes Mesner, Corene Cornea, MD  scopolamine (TRANSDERM-SCOP) 1 MG/3DAYS Place 1 patch onto the skin every 3 (three) days.   Yes [provider]  sucralfate (CARAFATE) 1 GM/10ML suspension Take 10 mLs (1 g total) by mouth every 6 (six) hours as needed. Patient taking differently: Take 1 g by mouth every 6 (six) hours as needed (stomach). 11/24/19  Yes Armbruster, Carlota Raspberry, MD  Continuous Blood Gluc Sensor (DEXCOM G6 SENSOR) MISC USE AS DIRECTED WITH CONTINUOUS BLOOD GLUCOSE MONITOR. REPLACE EVERY 10 DAYS 02/27/20   Renato Shin, MD  Continuous Blood Gluc Transmit (DEXCOM G6 TRANSMITTER) MISC USE AS DIRECTED TO  MONITOR  GLUCOSE  LEVELS 01/19/20   Renato Shin, MD  potassium chloride SA (KLOR-CON) 20 MEQ tablet Take 1 tablet (20 mEq total) by mouth daily for 5 doses. 07/04/19 07/30/19  Janeece Fitting, PA-C    Physical Exam: Vitals:   03/21/20 2130 03/21/20 2200 03/21/20 2300 03/21/20 2330  BP: (!) 183/120 (!) 190/103 (!) 178/94 (!) 184/102  Pulse: (!) 112 (!) 109 (!) 105 (!) 107  Resp: (!) 21 17 11 16   Temp:      TempSrc:      SpO2: 100% 100% 99% 100%  Weight:      Height:          Constitutional: Chronically ill looking, cachectic, in acute distress Vitals:   03/21/20 2130 03/21/20 2200 03/21/20 2300 03/21/20 2330  BP: (!) 183/120 (!) 190/103 (!) 178/94 (!) 184/102  Pulse: (!) 112 (!) 109 (!) 105 (!) 107  Resp: (!) 21 17 11 16   Temp:      TempSrc:      SpO2: 100% 100% 99% 100%  Weight:      Height:       Eyes: PERRL, lids and conjunctivae normal ENMT: Mucous membranes are dry. Posterior pharynx clear of any exudate or lesions.Normal dentition.  Neck: normal, supple, no masses, no thyromegaly Respiratory: clear to auscultation bilaterally, no wheezing, no crackles. Normal respiratory effort. No accessory muscle use.  Cardiovascular: Sinus tachycardia, no murmurs / rubs / gallops. No extremity edema. 2+ pedal pulses. No carotid bruits.  Abdomen: no tenderness, no masses palpated. No hepatosplenomegaly. Bowel sounds positive.  Musculoskeletal: no clubbing / cyanosis. No joint deformity upper and lower extremities. Good ROM, no contractures. Marked muscle wasting skin: no rashes, lesions, ulcers. No induration Neurologic: CN 2-12 grossly intact. Sensation intact, DTR normal. Strength 5/5 in all 4.  Psychiatric: Normal judgment and insight. Alert and oriented x 3. Depressed mood.     Labs on Admission: I have personally reviewed following labs and imaging studies  CBC: Recent Labs  Lab  03/19/20 1959 03/21/20 1950  WBC 5.7 6.9  HGB 10.4* 10.3*  HCT 32.5* 31.5*  MCV 87.1 85.6  PLT 233 818   Basic Metabolic Panel: Recent Labs  Lab 03/19/20 1959 03/21/20 1950  NA 136 135  K 4.1 3.6  CL 99 81*  CO2 23 32  GLUCOSE 305* 653*  BUN 17 58*  CREATININE 1.25* 3.95*  CALCIUM 9.5 8.4*   GFR: Estimated Creatinine Clearance: 19.4 mL/min (A) (by C-G formula based on SCr of 3.95 mg/dL (H)). Liver Function Tests: Recent Labs  Lab 03/19/20 1959 03/21/20 1950  AST 17 26  ALT 23 26  ALKPHOS 100 106  BILITOT 0.9 1.0  PROT 8.3* 8.9*  ALBUMIN 3.3* 3.8   Recent Labs  Lab 03/19/20 1959 03/21/20 1950  LIPASE 16 24   No results for input(s): AMMONIA in the last 168 hours. Coagulation Profile: No results for input(s): INR, PROTIME in the last 168 hours. Cardiac Enzymes: No results for input(s): CKTOTAL, CKMB, CKMBINDEX, TROPONINI in the last 168 hours. BNP (last 3 results) No results for input(s): PROBNP in the last 8760 hours. HbA1C: No results for input(s): HGBA1C in the last 72 hours. CBG: Recent Labs  Lab 03/20/20 0512 03/21/20 1833 03/21/20 2156 03/21/20 2243 03/21/20 2321  GLUCAP 335* 562* 561* 463* 359*   Lipid Profile: No results for input(s): CHOL, HDL, LDLCALC, TRIG, CHOLHDL, LDLDIRECT in the last 72 hours. Thyroid Function Tests: No results for input(s): TSH, T4TOTAL, FREET4, T3FREE, THYROIDAB in the last 72 hours. Anemia Panel: No results for input(s): VITAMINB12, FOLATE, FERRITIN, TIBC, IRON, RETICCTPCT in the last 72 hours. Urine analysis:    Component Value Date/Time   COLORURINE YELLOW 03/19/2020 2255   APPEARANCEUR CLEAR 03/19/2020 2255   LABSPEC 1.017 03/19/2020 2255   PHURINE 6.0 03/19/2020 2255   GLUCOSEU >=500 (A) 03/19/2020 2255   HGBUR MODERATE (A) 03/19/2020 2255   BILIRUBINUR NEGATIVE 03/19/2020 2255   BILIRUBINUR negative 01/02/2015 1717   KETONESUR 20 (A) 03/19/2020 2255   PROTEINUR >=300 (A) 03/19/2020 2255    UROBILINOGEN 0.2 01/13/2015 0223   NITRITE NEGATIVE 03/19/2020 2255   LEUKOCYTESUR NEGATIVE 03/19/2020 2255   Sepsis Labs: @LABRCNTIP (procalcitonin:4,lacticidven:4) ) Recent Results (from the past 240 hour(s))  Resp Panel by RT-PCR (Flu A&B, Covid) Nasopharyngeal Swab     Status: None   Collection Time: 03/21/20 10:07 PM   Specimen: Nasopharyngeal Swab; Nasopharyngeal(NP) swabs in vial transport medium  Result Value Ref Range Status   SARS Coronavirus 2 by RT PCR NEGATIVE NEGATIVE Final    Comment: (NOTE) SARS-CoV-2 target nucleic acids are NOT DETECTED.  The SARS-CoV-2 RNA is generally detectable in upper respiratory specimens during the acute phase of infection. The lowest concentration of SARS-CoV-2 viral copies this assay can detect is 138 copies/mL. A negative result does not preclude SARS-Cov-2 infection and should not be used as the sole basis for treatment or other patient management decisions. A negative result may occur with  improper specimen collection/handling, submission of specimen other than nasopharyngeal swab, presence of viral mutation(s) within the areas targeted by this assay, and inadequate number of viral copies(<138 copies/mL). A negative result must be combined with clinical observations, patient history, and epidemiological information. The expected result is Negative.  Fact Sheet for Patients:  EntrepreneurPulse.com.au  Fact Sheet for Healthcare Providers:  IncredibleEmployment.be  This test is no t yet approved or cleared by the [place] FDA and  has been authorized for detection and/or diagnosis of SARS-CoV-2 by FDA under an Emergency Use Authorization (EUA). This EUA will remain  in effect (meaning this test can be used) for the duration of the COVID-19 declaration under Section 564(b)(1) of the Act, 21 U.S.C.section 360bbb-3(b)(1), unless the authorization is terminated  or revoked sooner.        Influenza A by PCR NEGATIVE NEGATIVE Final   Influenza B by PCR NEGATIVE NEGATIVE Final  Comment: (NOTE) The Xpert Xpress SARS-CoV-2/FLU/RSV plus assay is intended as an aid in the diagnosis of influenza from Nasopharyngeal swab specimens and should not be used as a sole basis for treatment. Nasal washings and aspirates are unacceptable for Xpert Xpress SARS-CoV-2/FLU/RSV testing.  Fact Sheet for Patients: EntrepreneurPulse.com.au  Fact Sheet for Healthcare Providers: IncredibleEmployment.be  This test is not yet approved or cleared by the [place] FDA and has been authorized for detection and/or diagnosis of SARS-CoV-2 by FDA under an Emergency Use Authorization (EUA). This EUA will remain in effect (meaning this test can be used) for the duration of the COVID-19 declaration under Section 564(b)(1) of the Act, 21 U.S.C. section 360bbb-3(b)(1), unless the authorization is terminated or revoked.  Performed at [facility], [place] [address] [place], [place] [address]      Radiological Exams on Admission: CT Angio Chest/Abd/Pel for Dissection W and/or Wo Contrast  Result Date: 03/20/2020 CLINICAL DATA:  Mid back pain. EXAM: CT ANGIOGRAPHY CHEST, ABDOMEN AND PELVIS TECHNIQUE: Non-contrast CT of the chest was initially obtained. Multidetector CT imaging through the chest, abdomen and pelvis was performed using the standard protocol during bolus administration of intravenous contrast. Multiplanar reconstructed images and MIPs were obtained and reviewed to evaluate the vascular anatomy. CONTRAST:  57m OMNIPAQUE IOHEXOL 350 MG/ML SOLN COMPARISON:  February 13, 2020 FINDINGS: CTA CHEST FINDINGS Cardiovascular: Satisfactory opacification of the pulmonary arteries to the segmental level. No evidence of pulmonary embolism. Normal heart size. No pericardial effusion. Mediastinum/Nodes: No enlarged mediastinal, hilar, or axillary lymph  nodes. Thyroid gland, trachea, and esophagus demonstrate no significant findings. Lungs/Pleura: A 3 mm calcified lung nodule is seen within the right upper lobe. There is no evidence of acute infiltrate, pleural effusion or pneumothorax. Musculoskeletal: No chest wall abnormality. No acute or significant osseous findings. Review of the MIP images confirms the above findings. CTA ABDOMEN AND PELVIS FINDINGS VASCULAR Aorta: Normal caliber aorta without aneurysm, dissection, vasculitis or significant stenosis. Celiac: Patent without evidence of aneurysm, dissection, vasculitis or significant stenosis. SMA: Patent without evidence of aneurysm, dissection, vasculitis or significant stenosis. Renals: Both renal arteries are patent without evidence of aneurysm, dissection, vasculitis, fibromuscular dysplasia or significant stenosis. IMA: Patent without evidence of aneurysm, dissection, vasculitis or significant stenosis. Inflow: Patent without evidence of aneurysm, dissection, vasculitis or significant stenosis. Veins: No obvious venous abnormality within the limitations of this arterial phase study. Review of the MIP images confirms the above findings. NON-VASCULAR Hepatobiliary: There is diffuse fatty infiltration of the liver parenchyma. No focal liver abnormality is seen. Status post cholecystectomy. No biliary dilatation. Pancreas: Unremarkable. No pancreatic ductal dilatation or surrounding inflammatory changes. Spleen: Normal in size without focal abnormality. Adrenals/Urinary Tract: Adrenal glands are unremarkable. Kidneys are normal, without renal calculi, focal lesion, or hydronephrosis. Bladder is unremarkable. Stomach/Bowel: There is a small hiatal hernia. Appendix appears normal. No evidence of bowel dilatation. Moderate to marked severity diffusely thickened and inflamed large bowel is seen. Lymphatic: No abnormal abdominal or pelvic lymph nodes are identified. Reproductive: Uterus and bilateral adnexa are  unremarkable. Other: No abdominal wall hernia or abnormality. No abdominopelvic ascites. Musculoskeletal: No acute or significant osseous findings. Review of the MIP images confirms the above findings. IMPRESSION: 1. Moderate to marked severity diffuse colitis. 2. Fatty liver. 3. Evidence of prior cholecystectomy. Electronically Signed   By: TVirgina NorfolkM.D.   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 IV insulin and supportive care. We'll continue aggressive hydration. Control her nausea with vomiting.  #2 cyclical vomiting syndrome: Partly worsened with the DKA. Treat the DKA. Symptomatic treatment for her vomiting.  #3 severe protein calorie malnutrition: Dietary counseling prior to discharge.  #4 chronic kidney disease stage III with acute worsening: Hydrate and follow renal function.  #5 essential hypertension: Continue home regimen.   DVT prophylaxis: Lovenox Code Status: Full code Family Communication: No family at bedside Disposition Plan: Home Consults called: None Admission status: Inpatient  Severity of Illness: The appropriate patient status for this patient is INPATIENT. Inpatient status is judged to be reasonable and necessary in order to provide the required intensity of service to ensure the patient's safety. The patient's presenting symptoms, physical exam findings, and initial radiographic and laboratory data in the context of their chronic comorbidities is felt to place them at high risk for further clinical deterioration. Furthermore, it is not anticipated  that the patient will be medically stable for discharge from the hospital within 2 midnights of admission. The following factors support the patient status of inpatient.   " The patient's presenting symptoms include persistent nausea vomiting abdominal pain. " The worrisome physical exam findings include abdominal tenderness. " The initial radiographic and laboratory data are worrisome because of evidence of DKA. " The chronic co-morbidities include cyclical vomiting syndrome.   * I certify that at the point of admission it is my clinical judgment that the patient will require inpatient hospital care spanning beyond 2 midnights from the point of admission due to high intensity of service, high risk for further deterioration and high frequency of surveillance required.Barbette Merino MD [facility] Pager [phone]  If 7PM-7AM, please contact night-coverage [email or web address] Password [facility]  03/22/2020, 12:03 AM

## 2020-03-22 NOTE — Progress Notes (Signed)
Patient ID: [name], female   DOB: 12/26/1990, 29 y.o.   MRN: [id number] Patient admitted early this morning for nausea, vomiting and abdominal pain and was found to have DKA and started on IV fluids and insulin drip.  Patient seen and examined at bedside and plan of care discussed with her.  I have reviewed patient's medical records including this morning H&P, current vitals, labs, medications myself.  Latest BMP shows potassium of 4.1 and anion gap of 20.  Continue potassium replacement and continue with IV fluids and insulin drip.  Patient complains of severe pain all over, mostly in the back.  Will use IV analgesics as needed.  Continue insulin drip and follow BMP every 4 hours and transition to long-acting insulin once anion gap closes.  Diabetes coordinator consult.

## 2020-03-22 NOTE — ED Notes (Signed)
Report given to [facility], [place].  Pt SBAR information covered at this time.  No additional information requested by receiving nurse.  Pt standing at bedside at this time.  NADN.

## 2020-03-22 NOTE — Plan of Care (Signed)

## 2020-03-23 DIAGNOSIS — I152 Hypertension secondary to endocrine disorders: Secondary | ICD-10-CM

## 2020-03-23 DIAGNOSIS — E1010 Type 1 diabetes mellitus with ketoacidosis without coma: Secondary | ICD-10-CM | POA: Diagnosis not present

## 2020-03-23 DIAGNOSIS — E43 Unspecified severe protein-calorie malnutrition: Secondary | ICD-10-CM

## 2020-03-23 DIAGNOSIS — E1159 Type 2 diabetes mellitus with other circulatory complications: Secondary | ICD-10-CM

## 2020-03-23 DIAGNOSIS — N179 Acute kidney failure, unspecified: Secondary | ICD-10-CM | POA: Diagnosis not present

## 2020-03-23 LAB — GLUCOSE, CAPILLARY
Glucose-Capillary: 107 mg/dL — ABNORMAL HIGH (ref 70–99)
Glucose-Capillary: 132 mg/dL — ABNORMAL HIGH (ref 70–99)
Glucose-Capillary: 135 mg/dL — ABNORMAL HIGH (ref 70–99)
Glucose-Capillary: 147 mg/dL — ABNORMAL HIGH (ref 70–99)
Glucose-Capillary: 149 mg/dL — ABNORMAL HIGH (ref 70–99)
Glucose-Capillary: 230 mg/dL — ABNORMAL HIGH (ref 70–99)
Glucose-Capillary: 231 mg/dL — ABNORMAL HIGH (ref 70–99)

## 2020-03-23 LAB — BASIC METABOLIC PANEL
Anion gap: 12 (ref 5–15)
Anion gap: 9 (ref 5–15)
BUN: 48 mg/dL — ABNORMAL HIGH (ref 6–20)
BUN: 43 mg/dL — ABNORMAL HIGH (ref 6–20)
CO2: 29 mmol/L (ref 22–32)
CO2: 30 mmol/L (ref 22–32)
Calcium: 7.5 mg/dL — ABNORMAL LOW (ref 8.9–10.3)
Calcium: 7.6 mg/dL — ABNORMAL LOW (ref 8.9–10.3)
Chloride: 92 mmol/L — ABNORMAL LOW (ref 98–111)
Chloride: 97 mmol/L — ABNORMAL LOW (ref 98–111)
Creatinine, Ser: 3.76 mg/dL — ABNORMAL HIGH (ref 0.44–1.00)
Creatinine, Ser: 3.69 mg/dL — ABNORMAL HIGH (ref 0.44–1.00)
GFR, Estimated: 16 mL/min — ABNORMAL LOW (ref >60)
GFR, Estimated: 16 mL/min — ABNORMAL LOW (ref >60)
Glucose, Bld: 157 mg/dL — ABNORMAL HIGH (ref 70–99)
Glucose, Bld: 108 mg/dL — ABNORMAL HIGH (ref 70–99)
Potassium: 3.9 mmol/L (ref 3.5–5.1)
Potassium: 4.1 mmol/L (ref 3.5–5.1)
Sodium: 133 mmol/L — ABNORMAL LOW (ref 135–145)
Sodium: 136 mmol/L (ref 135–145)

## 2020-03-23 LAB — MAGNESIUM: Magnesium: 1.6 mg/dL — ABNORMAL LOW (ref 1.7–2.4)

## 2020-03-23 MED ORDER — INSULIN ASPART 100 UNIT/ML ~~LOC~~ SOLN
0.0000 [IU] | Freq: Three times a day (TID) | SUBCUTANEOUS | Status: DC
  Administered 2020-03-23: 2 [IU] via SUBCUTANEOUS

## 2020-03-23 MED ORDER — INSULIN ASPART 100 UNIT/ML ~~LOC~~ SOLN
0.0000 [IU] | Freq: Every day | SUBCUTANEOUS | Status: DC
  Administered 2020-03-23: 2 [IU] via SUBCUTANEOUS

## 2020-03-23 MED ORDER — PANTOPRAZOLE SODIUM 40 MG PO TBEC
40.0000 mg | DELAYED_RELEASE_TABLET | Freq: Two times a day (BID) | ORAL | Status: DC
Start: 2020-03-23 — End: 2020-03-24
  Administered 2020-03-23 – 2020-03-24 (×2): 40 mg via ORAL
  Filled 2020-03-23 (×2): qty 1

## 2020-03-23 MED ORDER — SODIUM CHLORIDE 0.9 % IV SOLN: INTRAVENOUS | Status: DC

## 2020-03-23 NOTE — Progress Notes (Signed)
Patient ID: [name], female   DOB: 12-16-1990, 29 y.o.   MRN: [id number]  PROGRESS NOTE    Malli Falotico Lerner  QMV:[id number] DOB: 1990-12-31 DOA: 03/21/2020 PCP: [name], MD   Brief Narrative:  29 y.o. female with medical history significant of type 1 diabetes, fibromyalgia, GERD, hypertension, anemia, gastroparesis, cyclical vomiting syndrome, severe protein calorie malnutrition presented intractable nausea, vomiting and abdominal pain.  On presentation, she was found to have DKA with anion gap of 22 and creatinine of 3.95.  She was started on IV fluids and insulin drip.  Assessment & Plan:   DKA in a patient with diabetes mellitus type 1 and diabetic gastroparesis Intractable nausea, vomiting and abdominal pain: Most likely from above -Initially presented with anion gap of 22 and hypoglycemia and treated with IV fluids and insulin drip with serial BMPs.  She has been transitioned to long-acting insulin after her anion gap closed overnight.  Currently blood sugars improving. -Tolerating diet.  Diabetes coordinator following. -Continue CBGs with SSI.  Outpatient follow-up with endocrinology  Acute renal failure -Most likely secondary to dehydration.  Creatinine 3.76 today improving.  Switch IV fluids to normal saline at 125 cc an hour.  Repeat a.m. labs  Hypomagnesemia -Replace.  Repeat a.m. labs  Severe protein calorie malnutrition -Follow nutrition recommendations  Essential hypertension -Blood pressure improving.  Continue Coreg, hydralazine and Imdur.   DVT prophylaxis: Lovenox Code Status: Full Family Communication: None at bedside Disposition Plan: Status is: Inpatient  Remains inpatient appropriate because:Inpatient level of care appropriate due to severity of illness   Dispo: The patient is from: Home              Anticipated d/c is to: Home              Anticipated d/c date is: 1 day              Patient currently is not medically stable to  d/c.   Consultants: None  Procedures: None  Antimicrobials: None   Subjective: Patient seen and examined at bedside.  She feels better with improving nausea and vomiting.  Tolerating diet.  No overnight fever or vomiting reported.  No chest pain or worsening shortness of breath.  Objective: Vitals:   03/23/20 0400 03/23/20 0405 03/23/20 0755 03/23/20 0800  BP:  (!) 167/100  (!) 108/54  Pulse: 96 96  88  Resp: 11 19    Temp: 98.5 F (36.9 C)  97.9 F (36.6 C)   TempSrc: Oral  Axillary   SpO2: 100% 98%  99%  Weight:      Height:        Intake/Output Summary (Last 24 hours) at 03/23/2020 1016 Last data filed at 03/23/2020 0511 Gross per 24 hour  Intake 4778.98 ml  Output 200 ml  Net 4578.98 ml   Filed Weights   03/21/20 1835  Weight: 59 kg    Examination:  General exam: Appears calm and comfortable.  Looks older than stated age Respiratory system: Bilateral decreased breath sounds at bases Cardiovascular system: S1 & S2 heard, Rate controlled Gastrointestinal system: Abdomen is nondistended, soft and nontender. Normal bowel sounds heard. Extremities: No cyanosis, clubbing, edema  Central nervous system: Alert and oriented. No focal neurological deficits. Moving extremities Skin: No rashes, lesions or ulcers Psychiatry: Judgement and insight appear normal. Mood & affect appropriate.     Data Reviewed: I have personally reviewed following labs and imaging studies  CBC: Recent Labs  Lab 03/19/20 1959  03/21/20 1950 03/22/20 0042  WBC 5.7 6.9 8.4  HGB 10.4* 10.3* 8.9*  HCT 32.5* 31.5* 26.5*  MCV 87.1 85.6 82.8  PLT 233 277 595   Basic Metabolic Panel: Recent Labs  Lab 03/22/20 0337 03/22/20 0846 03/22/20 1232 03/22/20 1643 03/22/20 2039 03/23/20 0109 03/23/20 0819  NA 139   < > 136 134* 132* 133* 136  K 3.1*   < > 2.9* 3.9 4.1 3.9 4.1  CL 86*   < > 88* 89* 90* 92* 97*  CO2 36*   < > 34* 31 30 29 30   GLUCOSE 204*   < > 233* 297* 236* 157* 108*   BUN 59*   < > 57* 54* 50* 48* 43*  CREATININE 4.15*   < > 3.72* 4.09* 3.69* 3.76* 3.69*  CALCIUM 8.0*   < > 7.6* 7.4* 7.3* 7.5* 7.6*  MG 1.8  --   --   --   --  1.6*  --   PHOS 4.8*  --   --   --   --   --   --    < > = values in this interval not displayed.   GFR: Estimated Creatinine Clearance: 20.8 mL/min (A) (by C-G formula based on SCr of 3.69 mg/dL (H)). Liver Function Tests: Recent Labs  Lab 03/19/20 1959 03/21/20 1950  AST 17 26  ALT 23 26  ALKPHOS 100 106  BILITOT 0.9 1.0  PROT 8.3* 8.9*  ALBUMIN 3.3* 3.8   Recent Labs  Lab 03/19/20 1959 03/21/20 1950  LIPASE 16 24   No results for input(s): AMMONIA in the last 168 hours. Coagulation Profile: No results for input(s): INR, PROTIME in the last 168 hours. Cardiac Enzymes: No results for input(s): CKTOTAL, CKMB, CKMBINDEX, TROPONINI in the last 168 hours. BNP (last 3 results) No results for input(s): PROBNP in the last 8760 hours. HbA1C: No results for input(s): HGBA1C in the last 72 hours. CBG: Recent Labs  Lab 03/22/20 2312 03/23/20 0055 03/23/20 0153 03/23/20 0304 03/23/20 0743  GLUCAP 154* 132* 149* 147* 107*   Lipid Profile: No results for input(s): CHOL, HDL, LDLCALC, TRIG, CHOLHDL, LDLDIRECT in the last 72 hours. Thyroid Function Tests: No results for input(s): TSH, T4TOTAL, FREET4, T3FREE, THYROIDAB in the last 72 hours. Anemia Panel: No results for input(s): VITAMINB12, FOLATE, FERRITIN, TIBC, IRON, RETICCTPCT in the last 72 hours. Sepsis Labs: No results for input(s): PROCALCITON, LATICACIDVEN in the last 168 hours.  Recent Results (from the past 240 hour(s))  Resp Panel by RT-PCR (Flu A&B, Covid) Nasopharyngeal Swab     Status: None   Collection Time: 03/21/20 10:07 PM   Specimen: Nasopharyngeal Swab; Nasopharyngeal(NP) swabs in vial transport medium  Result Value Ref Range Status   SARS Coronavirus 2 by RT PCR NEGATIVE NEGATIVE Final    Comment: (NOTE) SARS-CoV-2 target nucleic acids  are NOT DETECTED.  The SARS-CoV-2 RNA is generally detectable in upper respiratory specimens during the acute phase of infection. The lowest concentration of SARS-CoV-2 viral copies this assay can detect is 138 copies/mL. A negative result does not preclude SARS-Cov-2 infection and should not be used as the sole basis for treatment or other patient management decisions. A negative result may occur with  improper specimen collection/handling, submission of specimen other than nasopharyngeal swab, presence of viral mutation(s) within the areas targeted by this assay, and inadequate number of viral copies(<138 copies/mL). A negative result must be combined with clinical observations, patient history, and epidemiological information. The expected result  is Negative.  Fact Sheet for Patients:  EntrepreneurPulse.com.au  Fact Sheet for Healthcare Providers:  IncredibleEmployment.be  This test is no t yet approved or cleared by the [place] FDA and  has been authorized for detection and/or diagnosis of SARS-CoV-2 by FDA under an Emergency Use Authorization (EUA). This EUA will remain  in effect (meaning this test can be used) for the duration of the COVID-19 declaration under Section 564(b)(1) of the Act, 21 U.S.C.section 360bbb-3(b)(1), unless the authorization is terminated  or revoked sooner.       Influenza A by PCR NEGATIVE NEGATIVE Final   Influenza B by PCR NEGATIVE NEGATIVE Final    Comment: (NOTE) The Xpert Xpress SARS-CoV-2/FLU/RSV plus assay is intended as an aid in the diagnosis of influenza from Nasopharyngeal swab specimens and should not be used as a sole basis for treatment. Nasal washings and aspirates are unacceptable for Xpert Xpress SARS-CoV-2/FLU/RSV testing.  Fact Sheet for Patients: EntrepreneurPulse.com.au  Fact Sheet for Healthcare Providers: IncredibleEmployment.be  This test is  not yet approved or cleared by the [place] FDA and has been authorized for detection and/or diagnosis of SARS-CoV-2 by FDA under an Emergency Use Authorization (EUA). This EUA will remain in effect (meaning this test can be used) for the duration of the COVID-19 declaration under Section 564(b)(1) of the Act, 21 U.S.C. section 360bbb-3(b)(1), unless the authorization is terminated or revoked.  Performed at [facility], [place] [address] [place], [place] [address]   MRSA PCR Screening     Status: None   Collection Time: 03/22/20  6:38 AM   Specimen: Nasal Mucosa; Nasopharyngeal  Result Value Ref Range Status   MRSA by PCR NEGATIVE NEGATIVE Final    Comment:        The GeneXpert MRSA Assay (FDA approved for NASAL specimens only), is one component of a comprehensive MRSA colonization surveillance program. It is not intended to diagnose MRSA infection nor to guide or monitor treatment for MRSA infections. Performed at [facility], [place] [address] [place],  [address]          Radiology Studies: No results found.      Scheduled Meds: . carvedilol  6.25 mg Oral BID WC  . chlorhexidine  15 mL Mouth Rinse BID  . Chlorhexidine Gluconate Cloth  6 each Topical Daily  . diclofenac Sodium  2 g Topical QID  . enoxaparin (LOVENOX) injection  30 mg Subcutaneous Q24H  . hydrALAZINE  50 mg Oral Q6H  . insulin aspart  0-15 Units Subcutaneous TID AC & HS  . insulin glargine  10 Units Subcutaneous QHS  . isosorbide mononitrate  30 mg Oral Daily  . lidocaine  1 patch Transdermal Q24H  . mouth rinse  15 mL Mouth Rinse q12n4p  . metoCLOPramide (REGLAN) injection  5 mg Intravenous Q8H  . pantoprazole  40 mg Oral BID AC  . pantoprazole (PROTONIX) IV  40 mg Intravenous Q12H   Continuous Infusions: . 0.9 % NaCl with KCl 40 mEq / L 125 mL/hr at 03/23/20 0511  . insulin 1.8 Units/hr (03/22/20 1441)  . lactated ringers    . lactated  ringers 100 mL/hr at 03/23/20 0511          Aline August, MD [facility] 03/23/2020, 10:16 AM

## 2020-03-23 NOTE — Progress Notes (Signed)
Pt was transferred to [address]. Pt was stable. Pt was brought up in a wheelchair, off tele monitor. Pt was was set up in new room on the 4th floor with tech. Nurse was made aware of pt's arrival and was in her room eating.

## 2020-03-23 NOTE — Progress Notes (Signed)
Inpatient Diabetes Program Recommendations  AACE/ADA: New Consensus Statement on Inpatient Glycemic Control (2015)  Target Ranges:  Prepandial:   less than 140 mg/dL      Peak postprandial:   less than 180 mg/dL (1-2 hours)      Critically ill patients:  140 - 180 mg/dL   Lab Results  Component Value Date   GLUCAP 107 (H) 03/23/2020   HGBA1C 9.0 (A) 02/29/2020    Review of Glycemic Control Results for [name], [name] (MRN [id number]) as of 03/23/2020 08:01  Ref. Range 03/23/2020 00:55 03/23/2020 01:53 03/23/2020 03:04 03/23/2020 07:43  Glucose-Capillary Latest Ref Range: 70 - 99 mg/dL 132 (H) 149 (H) 147 (H) 107 (H)   Diabetes history: DM 1 Outpatient Diabetes medications:  NPH 20 units q AM Current orders for Inpatient glycemic control:  Lantus 10 units q HS, Novolog moderate tid with meals and HS  Inpatient Diabetes Program Recommendations:    Note patient sensitive to insulin doses.  Please reduce Novolog correction to "very Sensitive".  May consider d/c home on Lantus/Novolog regimen for safety in case patient is unable to eat?  Thanks,  Adah Perl, RN, BC-ADM Inpatient Diabetes Coordinator Pager [phone] (8a-5p)

## 2020-03-23 NOTE — Progress Notes (Signed)
PHARMACIST - PHYSICIAN COMMUNICATION  DR:   Starla Link  CONCERNING: IV to Oral Route Change Policy  RECOMMENDATION: This patient is receiving Protonix by the intravenous route.  Based on criteria approved by the Pharmacy and Therapeutics Committee, the intravenous medication(s) is/are being converted to the equivalent oral dose form(s).   DESCRIPTION: These criteria include:  The patient is eating (either orally or via tube) and/or has been taking other orally administered medications for a least 24 hours  The patient has no evidence of active gastrointestinal bleeding or impaired GI absorption (gastrectomy, short bowel, patient on TNA or NPO).  If you have questions about this conversion, please contact the [facility], PharmD, [place]: [phone] 03/23/2020 9:08 AM

## 2020-03-24 DIAGNOSIS — E1010 Type 1 diabetes mellitus with ketoacidosis without coma: Secondary | ICD-10-CM | POA: Diagnosis not present

## 2020-03-24 DIAGNOSIS — E1159 Type 2 diabetes mellitus with other circulatory complications: Secondary | ICD-10-CM | POA: Diagnosis not present

## 2020-03-24 DIAGNOSIS — N179 Acute kidney failure, unspecified: Secondary | ICD-10-CM | POA: Diagnosis not present

## 2020-03-24 DIAGNOSIS — E43 Unspecified severe protein-calorie malnutrition: Secondary | ICD-10-CM | POA: Diagnosis not present

## 2020-03-24 LAB — BASIC METABOLIC PANEL
Anion gap: 9 (ref 5–15)
BUN: 38 mg/dL — ABNORMAL HIGH (ref 6–20)
CO2: 26 mmol/L (ref 22–32)
Calcium: 7.9 mg/dL — ABNORMAL LOW (ref 8.9–10.3)
Chloride: 98 mmol/L (ref 98–111)
Creatinine, Ser: 2.81 mg/dL — ABNORMAL HIGH (ref 0.44–1.00)
GFR, Estimated: 22 mL/min — ABNORMAL LOW (ref >60)
Glucose, Bld: 173 mg/dL — ABNORMAL HIGH (ref 70–99)
Potassium: 3.8 mmol/L (ref 3.5–5.1)
Sodium: 133 mmol/L — ABNORMAL LOW (ref 135–145)

## 2020-03-24 LAB — GLUCOSE, CAPILLARY
Glucose-Capillary: 189 mg/dL — ABNORMAL HIGH (ref 70–99)
Glucose-Capillary: 116 mg/dL — ABNORMAL HIGH (ref 70–99)
Glucose-Capillary: 124 mg/dL — ABNORMAL HIGH (ref 70–99)

## 2020-03-24 LAB — MAGNESIUM: Magnesium: 1.7 mg/dL (ref 1.7–2.4)

## 2020-03-24 MED ORDER — ONDANSETRON HCL 4 MG PO TABS: 4.0000 mg | ORAL_TABLET | Freq: Three times a day (TID) | ORAL | 0 refills | Status: DC | PRN

## 2020-03-24 NOTE — Discharge Summary (Signed)
Physician Discharge Summary  AYANE DELANCEY MIW:[id number] DOB: Apr 01, 1990 DOA: 03/21/2020  PCP: [name], MD  Admit date: 03/21/2020 Discharge date: 03/24/2020  Admitted From: Home Disposition: Home  Recommendations for Outpatient Follow-up:  1. Follow up with PCP in 1 week with repeat CBC/BMP 2. Outpatient follow-up with GI and endocrinology 3. Follow up in ED if symptoms worsen or new appear   Home Health: No Equipment/Devices: None  Discharge Condition: Stable CODE STATUS: Full Diet recommendation: Heart healthy/carb modified  Brief/Interim Summary: 29 y.o.femalewith medical history significant oftype 1 diabetes, fibromyalgia, GERD, hypertension, anemia, gastroparesis, severe protein calorie malnutrition presented intractable nausea, vomiting and abdominal pain.  On presentation, she was found to have DKA with anion gap of 22 and creatinine of 3.95.  She was started on IV fluids and insulin drip. During the hospitalization, she was transitioned to long-acting insulin. Subsequently, she has tolerated her diet and her symptoms are improving. Her creatinine is still elevated but patient is really adamant about going home. She will be discharged home with close outpatient follow-up with PCP/endocrinology and GI.  Discharge Diagnoses:   DKA in a patient with diabetes mellitus type 1 and diabetic gastroparesis Intractable nausea, vomiting and abdominal pain: Most likely from above -Initially presented with anion gap of 22 and hypoglycemia and treated with IV fluids and insulin drip with serial BMPs.  She has been transitioned to long-acting insulin after her anion gap closed overnight.  Currently blood sugars improving. -Tolerating diet.  -Carb modified diet. Resume home regimen on discharge. Close Outpatient follow-up with endocrinology  Acute renal failure -Most likely secondary to dehydration.  Creatinine 2.81 today; improving.   Currently on IV fluids. Patient is making  urine. -patient is really adamant about going home. She will be discharged home today with close outpatient follow-up with PCP with repeat BMP within a week. Patient was advised to continue oral hydration upon discharge.  -Hypomagnesemia -Improved  Severe protein calorie malnutrition -Follow nutrition recommendations  Essential hypertension -Blood pressure improving.  Continue Coreg, hydralazine and Imdur. Outpatient follow-up  Discharge Instructions  Discharge Instructions    Diet Carb Modified   Complete by: As directed    Increase activity slowly   Complete by: As directed      Allergies as of 03/24/2020      Reactions   Other Anaphylaxis   Reaction to [place] nuts   Lactose Intolerance (gi) Diarrhea      Medication List    STOP taking these medications   ciprofloxacin 500 MG tablet Commonly known as: CIPRO     TAKE these medications   aluminum-magnesium hydroxide-simethicone 250-037-04 MG/5ML Susp Commonly known as: MAALOX Take 30 mLs by mouth 3 (three) times daily as needed (indigestion).   busPIRone 5 MG tablet Commonly known as: BUSPAR Take 1 tablet (5 mg total) by mouth 3 (three) times daily.   carvedilol 6.25 MG tablet Commonly known as: COREG Take 6.25 mg by mouth 2 (two) times daily with a meal.   cyclobenzaprine 10 MG tablet Commonly known as: FLEXERIL TAKE 1 TABLET(10 MG) BY MOUTH THREE TIMES DAILY AS NEEDED FOR MUSCLE SPASMS What changed: See the new instructions.   Dexcom G6 Sensor Misc USE AS DIRECTED WITH CONTINUOUS BLOOD GLUCOSE MONITOR. REPLACE EVERY 10 DAYS   Dexcom G6 Transmitter Misc USE AS DIRECTED TO  MONITOR  GLUCOSE  LEVELS   dicyclomine 20 MG tablet Commonly known as: BENTYL Take 1 tablet (20 mg total) by mouth every 8 (eight) hours as needed for  spasms.   HumuLIN N KwikPen 100 UNIT/ML Kiwkpen Generic drug: Insulin NPH (Human) (Isophane) Inject 20 Units into the skin every morning.   hydrALAZINE 50 MG tablet Commonly known  as: APRESOLINE TAKE 1 TABLET(50 MG) BY MOUTH EVERY 6 HOURS What changed: See the new instructions.   isosorbide mononitrate 30 MG 24 hr tablet Commonly known as: IMDUR Take 30 mg by mouth daily.   loperamide 2 MG capsule Commonly known as: IMODIUM Take 6 mg by mouth daily as needed for diarrhea or loose stools.   LORazepam 0.5 MG tablet Commonly known as: ATIVAN TAKE 1 TABLET(0.5 MG) BY MOUTH TWICE DAILY AS NEEDED FOR ANXIETY What changed: See the new instructions.   metoCLOPramide 5 MG tablet Commonly known as: REGLAN Take 1 tablet (5 mg total) by mouth in the morning and at bedtime. Please schedule an Office visit for further refills. Thank you   mirtazapine 30 MG tablet Commonly known as: REMERON TAKE 1 TABLET BY MOUTH AT  BEDTIME   ondansetron 4 MG tablet Commonly known as: ZOFRAN Take 1 tablet (4 mg total) by mouth every 8 (eight) hours as needed for nausea or vomiting.   pantoprazole 40 MG tablet Commonly known as: PROTONIX Take 1 tablet (40 mg total) by mouth 2 (two) times daily.   promethazine 25 MG tablet Commonly known as: PHENERGAN Take 1 tablet (25 mg total) by mouth every 6 (six) hours as needed for nausea or vomiting.   promethazine 25 MG suppository Commonly known as: PHENERGAN Place 1 suppository (25 mg total) rectally every 6 (six) hours as needed for nausea or vomiting.   scopolamine 1 MG/3DAYS Commonly known as: TRANSDERM-SCOP Place 1 patch onto the skin every 3 (three) days.   sucralfate 1 GM/10ML suspension Commonly known as: Carafate Take 10 mLs (1 g total) by mouth every 6 (six) hours as needed. What changed: reasons to take this       Follow-up Information    [name], MD. Schedule an appointment as soon as possible for a visit in 1 week(s).   Specialty: Family Medicine Why: with repeat cbc/bmp Contact information: [place] [address] [phone]              Allergies  Allergen Reactions  . Other  Anaphylaxis    Reaction to [place] nuts   . Lactose Intolerance (Gi) Diarrhea    Consultations:  None   Procedures/Studies: CT Angio Chest/Abd/Pel for Dissection W and/or Wo Contrast  Result Date: 03/20/2020 CLINICAL DATA:  Mid back pain. EXAM: CT ANGIOGRAPHY CHEST, ABDOMEN AND PELVIS TECHNIQUE: Non-contrast CT of the chest was initially obtained. Multidetector CT imaging through the chest, abdomen and pelvis was performed using the standard protocol during bolus administration of intravenous contrast. Multiplanar reconstructed images and MIPs were obtained and reviewed to evaluate the vascular anatomy. CONTRAST:  82m OMNIPAQUE IOHEXOL 350 MG/ML SOLN COMPARISON:  February 13, 2020 FINDINGS: CTA CHEST FINDINGS Cardiovascular: Satisfactory opacification of the pulmonary arteries to the segmental level. No evidence of pulmonary embolism. Normal heart size. No pericardial effusion. Mediastinum/Nodes: No enlarged mediastinal, hilar, or axillary lymph nodes. Thyroid gland, trachea, and esophagus demonstrate no significant findings. Lungs/Pleura: A 3 mm calcified lung nodule is seen within the right upper lobe. There is no evidence of acute infiltrate, pleural effusion or pneumothorax. Musculoskeletal: No chest wall abnormality. No acute or significant osseous findings. Review of the MIP images confirms the above findings. CTA ABDOMEN AND PELVIS FINDINGS VASCULAR Aorta: Normal caliber aorta without aneurysm,  dissection, vasculitis or significant stenosis. Celiac: Patent without evidence of aneurysm, dissection, vasculitis or significant stenosis. SMA: Patent without evidence of aneurysm, dissection, vasculitis or significant stenosis. Renals: Both renal arteries are patent without evidence of aneurysm, dissection, vasculitis, fibromuscular dysplasia or significant stenosis. IMA: Patent without evidence of aneurysm, dissection, vasculitis or significant stenosis. Inflow: Patent without evidence of aneurysm,  dissection, vasculitis or significant stenosis. Veins: No obvious venous abnormality within the limitations of this arterial phase study. Review of the MIP images confirms the above findings. NON-VASCULAR Hepatobiliary: There is diffuse fatty infiltration of the liver parenchyma. No focal liver abnormality is seen. Status post cholecystectomy. No biliary dilatation. Pancreas: Unremarkable. No pancreatic ductal dilatation or surrounding inflammatory changes. Spleen: Normal in size without focal abnormality. Adrenals/Urinary Tract: Adrenal glands are unremarkable. Kidneys are normal, without renal calculi, focal lesion, or hydronephrosis. Bladder is unremarkable. Stomach/Bowel: There is a small hiatal hernia. Appendix appears normal. No evidence of bowel dilatation. Moderate to marked severity diffusely thickened and inflamed large bowel is seen. Lymphatic: No abnormal abdominal or pelvic lymph nodes are identified. Reproductive: Uterus and bilateral adnexa are unremarkable. Other: No abdominal wall hernia or abnormality. No abdominopelvic ascites. Musculoskeletal: No acute or significant osseous findings. Review of the MIP images confirms the above findings. IMPRESSION: 1. Moderate to marked severity diffuse colitis. 2. Fatty liver. 3. Evidence of prior cholecystectomy. Electronically Signed   By: Virgina Norfolk M.D.   On: 03/20/2020 02:32       Subjective: Patient seen and examined at bedside. She is adamant that she wants to go home today and intermittently even cries asking for the same. Tolerating diet. Denies current nausea, vomiting or overnight fever.  Discharge Exam: Vitals:   03/24/20 0245 03/24/20 0609  BP: 126/79 (!) 154/95  Pulse: 92 98  Resp: 18 17  Temp: 98.1 F (36.7 C) 98 F (36.7 C)  SpO2: 98% 97%    General: Pt is alert, awake, not in acute distress. Chronically ill looking female. Looks anxious and intermittently crying Cardiovascular: rate controlled, S1/S2 + Respiratory:  bilateral decreased breath sounds at bases Abdominal: Soft, NT, ND, bowel sounds + Extremities: no edema, no cyanosis    The results of significant diagnostics from this hospitalization (including imaging, microbiology, ancillary and laboratory) are listed below for reference.     Microbiology: Recent Results (from the past 240 hour(s))  Resp Panel by RT-PCR (Flu A&B, Covid) Nasopharyngeal Swab     Status: None   Collection Time: 03/21/20 10:07 PM   Specimen: Nasopharyngeal Swab; Nasopharyngeal(NP) swabs in vial transport medium  Result Value Ref Range Status   SARS Coronavirus 2 by RT PCR NEGATIVE NEGATIVE Final    Comment: (NOTE) SARS-CoV-2 target nucleic acids are NOT DETECTED.  The SARS-CoV-2 RNA is generally detectable in upper respiratory specimens during the acute phase of infection. The lowest concentration of SARS-CoV-2 viral copies this assay can detect is 138 copies/mL. A negative result does not preclude SARS-Cov-2 infection and should not be used as the sole basis for treatment or other patient management decisions. A negative result may occur with  improper specimen collection/handling, submission of specimen other than nasopharyngeal swab, presence of viral mutation(s) within the areas targeted by this assay, and inadequate number of viral copies(<138 copies/mL). A negative result must be combined with clinical observations, patient history, and epidemiological information. The expected result is Negative.  Fact Sheet for Patients:  EntrepreneurPulse.com.au  Fact Sheet for Healthcare Providers:  IncredibleEmployment.be  This test is no t yet approved  or cleared by the [place] and  has been authorized for detection and/or diagnosis of SARS-CoV-2 by FDA under an Emergency Use Authorization (EUA). This EUA will remain  in effect (meaning this test can be used) for the duration of the COVID-19 declaration under Section  564(b)(1) of the Act, 21 U.S.C.section 360bbb-3(b)(1), unless the authorization is terminated  or revoked sooner.       Influenza A by PCR NEGATIVE NEGATIVE Final   Influenza B by PCR NEGATIVE NEGATIVE Final    Comment: (NOTE) The Xpert Xpress SARS-CoV-2/FLU/RSV plus assay is intended as an aid in the diagnosis of influenza from Nasopharyngeal swab specimens and should not be used as a sole basis for treatment. Nasal washings and aspirates are unacceptable for Xpert Xpress SARS-CoV-2/FLU/RSV testing.  Fact Sheet for Patients: EntrepreneurPulse.com.au  Fact Sheet for Healthcare Providers: IncredibleEmployment.be  This test is not yet approved or cleared by the [place] FDA and has been authorized for detection and/or diagnosis of SARS-CoV-2 by FDA under an Emergency Use Authorization (EUA). This EUA will remain in effect (meaning this test can be used) for the duration of the COVID-19 declaration under Section 564(b)(1) of the Act, 21 U.S.C. section 360bbb-3(b)(1), unless the authorization is terminated or revoked.  Performed at [facility], [place] [address] [place], [place] [address]   MRSA PCR Screening     Status: None   Collection Time: 03/22/20  6:38 AM   Specimen: Nasal Mucosa; Nasopharyngeal  Result Value Ref Range Status   MRSA by PCR NEGATIVE NEGATIVE Final    Comment:        The GeneXpert MRSA Assay (FDA approved for NASAL specimens only), is one component of a comprehensive MRSA colonization surveillance program. It is not intended to diagnose MRSA infection nor to guide or monitor treatment for MRSA infections. Performed at [facility], [place] [address], [place], [place] [address]      Labs: BNP (last 3 results) No results for input(s): BNP in the last 8760 hours. Basic Metabolic Panel: Recent Labs  Lab 03/22/20 0337 03/22/20 0846 03/22/20 1643 03/22/20 2039  03/23/20 0109 03/23/20 0819 03/24/20 0558  NA 139   < > 134* 132* 133* 136 133*  K 3.1*   < > 3.9 4.1 3.9 4.1 3.8  CL 86*   < > 89* 90* 92* 97* 98  CO2 36*   < > 31 30 29 30 26   GLUCOSE 204*   < > 297* 236* 157* 108* 173*  BUN 59*   < > 54* 50* 48* 43* 38*  CREATININE 4.15*   < > 4.09* 3.69* 3.76* 3.69* 2.81*  CALCIUM 8.0*   < > 7.4* 7.3* 7.5* 7.6* 7.9*  MG 1.8  --   --   --  1.6*  --  1.7  PHOS 4.8*  --   --   --   --   --   --    < > = values in this interval not displayed.   Liver Function Tests: Recent Labs  Lab 03/19/20 1959 03/21/20 1950  AST 17 26  ALT 23 26  ALKPHOS 100 106  BILITOT 0.9 1.0  PROT 8.3* 8.9*  ALBUMIN 3.3* 3.8   Recent Labs  Lab 03/19/20 1959 03/21/20 1950  LIPASE 16 24   No results for input(s): AMMONIA in the last 168 hours. CBC: Recent Labs  Lab 03/19/20 1959 03/21/20 1950 03/22/20 0042  WBC 5.7 6.9 8.4  HGB 10.4* 10.3* 8.9*  HCT 32.5* 31.5* 26.5*  MCV 87.1 85.6 82.8  PLT 233 277 254   Cardiac Enzymes: No results for input(s): CKTOTAL, CKMB, CKMBINDEX, TROPONINI in the last 168 hours. BNP: Invalid input(s): POCBNP CBG: Recent Labs  Lab 03/23/20 1135 03/23/20 1544 03/23/20 2146 03/24/20 0254 03/24/20 0745  GLUCAP 135* 231* 230* 189* 116*   D-Dimer No results for input(s): DDIMER in the last 72 hours. Hgb A1c No results for input(s): HGBA1C in the last 72 hours. Lipid Profile No results for input(s): CHOL, HDL, LDLCALC, TRIG, CHOLHDL, LDLDIRECT in the last 72 hours. Thyroid function studies No results for input(s): TSH, T4TOTAL, T3FREE, THYROIDAB in the last 72 hours.  Invalid input(s): FREET3 Anemia work up No results for input(s): VITAMINB12, FOLATE, FERRITIN, TIBC, IRON, RETICCTPCT in the last 72 hours. Urinalysis    Component Value Date/Time   COLORURINE YELLOW 03/22/2020 0427   APPEARANCEUR HAZY (A) 03/22/2020 0427   LABSPEC 1.022 03/22/2020 0427   PHURINE 6.0 03/22/2020 0427   GLUCOSEU >=500 (A) 03/22/2020  0427   HGBUR MODERATE (A) 03/22/2020 0427   BILIRUBINUR NEGATIVE 03/22/2020 0427   BILIRUBINUR negative 01/02/2015 1717   KETONESUR 5 (A) 03/22/2020 0427   PROTEINUR >=300 (A) 03/22/2020 0427   UROBILINOGEN 0.2 01/13/2015 0223   NITRITE NEGATIVE 03/22/2020 0427   LEUKOCYTESUR NEGATIVE 03/22/2020 0427   Sepsis Labs Invalid input(s): PROCALCITONIN,  WBC,  LACTICIDVEN Microbiology Recent Results (from the past 240 hour(s))  Resp Panel by RT-PCR (Flu A&B, Covid) Nasopharyngeal Swab     Status: None   Collection Time: 03/21/20 10:07 PM   Specimen: Nasopharyngeal Swab; Nasopharyngeal(NP) swabs in vial transport medium  Result Value Ref Range Status   SARS Coronavirus 2 by RT PCR NEGATIVE NEGATIVE Final    Comment: (NOTE) SARS-CoV-2 target nucleic acids are NOT DETECTED.  The SARS-CoV-2 RNA is generally detectable in upper respiratory specimens during the acute phase of infection. The lowest concentration of SARS-CoV-2 viral copies this assay can detect is 138 copies/mL. A negative result does not preclude SARS-Cov-2 infection and should not be used as the sole basis for treatment or other patient management decisions. A negative result may occur with  improper specimen collection/handling, submission of specimen other than nasopharyngeal swab, presence of viral mutation(s) within the areas targeted by this assay, and inadequate number of viral copies(<138 copies/mL). A negative result must be combined with clinical observations, patient history, and epidemiological information. The expected result is Negative.  Fact Sheet for Patients:  EntrepreneurPulse.com.au  Fact Sheet for Healthcare Providers:  IncredibleEmployment.be  This test is no t yet approved or cleared by the [place] FDA and  has been authorized for detection and/or diagnosis of SARS-CoV-2 by FDA under an Emergency Use Authorization (EUA). This EUA will remain  in effect  (meaning this test can be used) for the duration of the COVID-19 declaration under Section 564(b)(1) of the Act, 21 U.S.C.section 360bbb-3(b)(1), unless the authorization is terminated  or revoked sooner.       Influenza A by PCR NEGATIVE NEGATIVE Final   Influenza B by PCR NEGATIVE NEGATIVE Final    Comment: (NOTE) The Xpert Xpress SARS-CoV-2/FLU/RSV plus assay is intended as an aid in the diagnosis of influenza from Nasopharyngeal swab specimens and should not be used as a sole basis for treatment. Nasal washings and aspirates are unacceptable for Xpert Xpress SARS-CoV-2/FLU/RSV testing.  Fact Sheet for Patients: EntrepreneurPulse.com.au  Fact Sheet for Healthcare Providers: IncredibleEmployment.be  This test is not yet approved or cleared by the  [place] Architectural technologist and has been authorized for detection and/or diagnosis of SARS-CoV-2 by FDA under an Print production planner (EUA). This EUA will remain in effect (meaning this test can be used) for the duration of the COVID-19 declaration under Section 564(b)(1) of the Act, 21 U.S.C. section 360bbb-3(b)(1), unless the authorization is terminated or revoked.  Performed at [facility], [place] [address], [place], [place] [address]   MRSA PCR Screening     Status: None   Collection Time: 03/22/20  6:38 AM   Specimen: Nasal Mucosa; Nasopharyngeal  Result Value Ref Range Status   MRSA by PCR NEGATIVE NEGATIVE Final    Comment:        The GeneXpert MRSA Assay (FDA approved for NASAL specimens only), is one component of a comprehensive MRSA colonization surveillance program. It is not intended to diagnose MRSA infection nor to guide or monitor treatment for MRSA infections. Performed at [facility], [place] [address], [place], [place] [address]      Time coordinating discharge: 35 minutes  SIGNED:   Aline August, MD  [facility] 03/24/2020, 10:21 AM

## 2020-03-26 ENCOUNTER — Telehealth: Payer: Self-pay

## 2020-03-26 LAB — GLUCOSE, CAPILLARY: Glucose-Capillary: 19 mg/dL — CL (ref 70–99)

## 2020-03-26 NOTE — Telephone Encounter (Signed)
Transition Care Management Follow-up Telephone Call  Date of discharge and from where: [facility] on 03/24/20.  How have you been since you were released from the hospital? Doing well, BS readings have been good. Readings are ranging from 100-150. Pt has been watching what she eats and is trying to stick with the carb modified diabetic diet. Pt is still having nausea and vomiting. Last time she vomited was this morning but it was not a lot. Pt is taking Phenergan as needed for the nausea and states it helps after taking it. Pt is still having mid back pain. Current pain level is a 6. Pt is taking OTC Ibuprofen 400 mg as needed for pain. Pt declined increasing dose due to stomach sensitivity. Pt is having soft lettuce like stools 1-2 xs a day but states that is normal for her. She is unsure of the cause. Pt declines pain elsewhere, SOB or weakness.   Any questions or concerns? No   Items Reviewed:  Did the pt receive and understand the discharge instructions provided? Yes   Medications obtained and verified? No, pt declined reviewing medications due to no changes. Did verify that Cipro was stopped.   Any new allergies since your discharge? No   Dietary orders reviewed? Yes  Do you have support at home? Yes   Other (ie: DME, Home Health, etc): N/A  Functional Questionnaire: (I = Independent and D = Dependent)  Bathing/Dressing- I   Meal Prep- I  Eating- I  Maintaining continence- Has some loss of control with BMs due to diarrhea.  Transferring/Ambulation- I  Managing Meds- I   Follow up appointments reviewed:    PCP Hospital f/u appt confirmed? Yes  scheduled to see Dr Jerline Pain on 04/02/20 @ 3:40 PM.  [facility] f/u appt confirmed? Yes    Are transportation arrangements needed? No   If their condition worsens, is the pt aware to call  their PCP or go to the ED? Yes  Was the patient provided with contact information for the PCP's office or ED? Yes  Was the pt encouraged  to call back with questions or concerns? Yes

## 2020-03-28 ENCOUNTER — Other Ambulatory Visit: Payer: Self-pay

## 2020-03-28 MED ORDER — METOCLOPRAMIDE HCL 5 MG PO TABS: 5.0000 mg | ORAL_TABLET | Freq: Two times a day (BID) | ORAL | 0 refills | Status: DC

## 2020-03-31 DIAGNOSIS — U071 COVID-19: Secondary | ICD-10-CM | POA: Insufficient documentation

## 2020-03-31 HISTORY — DX: COVID-19: U07.1

## 2020-04-02 ENCOUNTER — Telehealth (INDEPENDENT_AMBULATORY_CARE_PROVIDER_SITE_OTHER): Payer: 59 | Admitting: Family Medicine

## 2020-04-02 ENCOUNTER — Encounter: Payer: Self-pay | Admitting: Family Medicine

## 2020-04-02 VITALS — Ht 67.0 in | Wt 130.0 lb

## 2020-04-02 DIAGNOSIS — R1115 Cyclical vomiting syndrome unrelated to migraine: Secondary | ICD-10-CM | POA: Diagnosis not present

## 2020-04-02 DIAGNOSIS — F330 Major depressive disorder, recurrent, mild: Secondary | ICD-10-CM | POA: Diagnosis not present

## 2020-04-02 DIAGNOSIS — E1069 Type 1 diabetes mellitus with other specified complication: Secondary | ICD-10-CM | POA: Diagnosis not present

## 2020-04-02 MED ORDER — CYCLOBENZAPRINE HCL 10 MG PO TABS: ORAL_TABLET | ORAL | 0 refills | Status: DC

## 2020-04-02 NOTE — Assessment & Plan Note (Signed)
Reports sugars have been at goal.  She will follow-up with endocrinology soon.  A1c 9.0 while in hospital.  She has been compliant with insulin since being discharged.

## 2020-04-02 NOTE — Assessment & Plan Note (Signed)
Has follow up with GI soon. Does not need refill.

## 2020-04-02 NOTE — Progress Notes (Signed)
Chief Complaint:  [name] is a 30 y.o. female who presents today for a TCM visit.  Assessment/Plan:  New/Acute Problems: AKI Not able check labs today due to being virtual visit.  She will follow-up with GI later this week.  Will need to recheck be met soon.  Encourage good oral hydration.  Chronic Problems Addressed Today: Diabetes mellitus type 1 ([place]) Reports sugars have been at goal.  She will follow-up with endocrinology soon.  A1c 9.0 while in hospital.  She has been compliant with insulin since being discharged.  MDD (major depressive disorder), recurrent episode, mild ([place]) Will place referral to behavioral health.   Cyclical vomiting syndrome Has follow up with GI soon. Does not need refill.    Subjective:  HPI:  Summary of Hospital admission: Reason for admission: DKA Date of admission: 03/21/2020 Date of discharge: 03/24/2020 Date of Interactive contact: 03/26/2020 Summary of Hospital course: Patient presented to the ED with intractable nausea vomiting and abdominal pain.  Found to be in DKA.  Admitted for IV fluids and insulin.  She was able to be transitioned to subcu insulin regimen.  She was discharged home in stable condition.  Interim history: She has done well since being home.  Sugars have been mostly in the low 100s.  She has had some nausea that has been managed with her home medications.  She will be following up with GI next week.  She would like to have referral to see a psychiatrist as we discussed her previous office visits.  ROS: , otherwise a complete review of systems was negative.   PMH:  The following were reviewed and entered/updated in epic: Past Medical History:  Diagnosis Date  . Allergy   . Anemia   . Anxiety   . Blood transfusion without reported diagnosis    Dec 2018  . Cataract    right eye  . COVID-19   . Depression   . Diabetes type 1, uncontrolled ([place]) 11/14/2011   Since age 38  . Fibromyalgia   . Gastroparesis    . GERD (gastroesophageal reflux disease)   . Hypertension   . Infection    UTI April 2016   Patient Active Problem List   Diagnosis Date Noted  . DKA (diabetic ketoacidosis) ([place]) 03/22/2020  . 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 Hypertensive urgency 04/30/2019  . Cannabinoid hyperemesis syndrome 04/30/2019  . Contraception management 08/06/2018  . Cyclical vomiting syndrome 06/07/2018  . Diabetic gastroparesis associated with type 1 diabetes mellitus ([place]) 06/04/2018  . Anxiety 05/31/2018  . Peripheral edema 05/31/2018  . Normocytic anemia 07/10/2017  . GERD (gastroesophageal reflux disease) 06/23/2017  . Intractable nausea and vomiting 04/11/2017  . MDD (major depressive disorder), recurrent episode, mild ([place]) 02/28/2017  . Diabetes mellitus type 1 ([place]) 04/28/2013  . Protein-calorie malnutrition, severe ([place]) 04/28/2013  . Hypertension associated with diabetes ([place]) 11/14/2011  . Chronic abdominal pain 09/18/2011   Past Surgical History:  Procedure Laterality Date  . ANKLE SURGERY    . CHOLECYSTECTOMY  11/15/2011   Procedure: LAPAROSCOPIC CHOLECYSTECTOMY WITH INTRAOPERATIVE CHOLANGIOGRAM;  Surgeon: [name], MD;  Location: [facility];  Service: General;  Laterality: N/A;  . COLONOSCOPY    . COLONOSCOPY WITH PROPOFOL N/A 06/27/2017   Procedure: COLONOSCOPY WITH PROPOFOL;  Surgeon: Milus Banister, MD;  [facility];  Service: Endoscopy;  Laterality: N/A;  . ESOPHAGOGASTRODUODENOSCOPY  12/03/2011   Procedure: ESOPHAGOGASTRODUODENOSCOPY (EGD);  Surgeon: Tory Emerald  Benson [name], MD;  Location: [facility];  Service: Endoscopy;  Laterality: N/A;  . FLEXIBLE SIGMOIDOSCOPY N/A 03/10/2017   Procedure: FLEXIBLE SIGMOIDOSCOPY;  Surgeon: Carol Ada, MD;  [facility];  Service: Endoscopy;  Laterality: N/A;  . INCISION AND DRAINAGE PERIRECTAL ABSCESS N/A 03/01/2017   Procedure: IRRIGATION AND DEBRIDEMENT PERIRECTAL ABSCESS;  Surgeon: Alphonsa Overall, MD;  [facility];  Service: General;  Laterality: N/A;  . IRRIGATION AND DEBRIDEMENT BUTTOCKS N/A 03/23/2017   Procedure: IRRIGATION AND DEBRIDEMENT BUTTOCKS, SETON PLACEMENT;  Surgeon: [name], MD;  [facility];  Service: General;  Laterality: N/A;  . LAPAROSCOPY  11/23/2011   Procedure: LAPAROSCOPY DIAGNOSTIC;  Surgeon: Edward Jolly, MD;  [facility];  Service: General;  Laterality: N/A;  . SIGMOIDOSCOPY    . UPPER GASTROINTESTINAL ENDOSCOPY    . WISDOM TOOTH EXTRACTION      Family History  Problem Relation Age of Onset  . Diabetes Mother   . Hypertension Father   . Colon cancer Paternal Grandmother        pt thinks PGM was dx in her 59's  . Diabetes Paternal Grandmother   . Diabetes Maternal Grandmother   . Diabetes Maternal Grandfather   . Diabetes Paternal Grandfather   . Diabetes Other   . Breast cancer Paternal Aunt   . Esophageal cancer Neg Hx   . Liver cancer Neg Hx   . Pancreatic cancer Neg Hx   . Stomach cancer Neg Hx   . Rectal cancer Neg Hx     Medications- Reconciled discharge and current medications in Epic.  Current Outpatient Medications  Medication Sig Dispense Refill  . aluminum-magnesium hydroxide-simethicone (MAALOX) [phone] MG/5ML SUSP Take 30 mLs by mouth 3 (three) times daily as needed (indigestion).    . busPIRone (BUSPAR) 5 MG tablet Take 1 tablet (5 mg total) by mouth 3 (three) times daily. 90 tablet 5  . carvedilol (COREG) 6.25 MG tablet Take 6.25 mg by mouth 2 (two) times daily with a meal.    . Continuous Blood Gluc Sensor (DEXCOM G6 SENSOR) MISC USE AS DIRECTED WITH CONTINUOUS BLOOD GLUCOSE MONITOR. REPLACE EVERY 10 DAYS 3 each 1  . Continuous Blood Gluc Transmit (DEXCOM G6 TRANSMITTER) MISC USE AS DIRECTED TO  MONITOR  GLUCOSE  LEVELS 1 each 0  . dicyclomine (BENTYL) 20 MG tablet Take 1 tablet (20 mg total) by mouth every 8 (eight) hours as needed for spasms. 15 tablet 0  . hydrALAZINE (APRESOLINE) 50 MG  tablet TAKE 1 TABLET(50 MG) BY MOUTH EVERY 6 HOURS (Patient taking differently: Take 50 mg by mouth every 6 (six) hours.) 120 tablet 0  . Insulin NPH, Human,, Isophane, (HUMULIN N KWIKPEN) 100 UNIT/ML Kiwkpen Inject 20 Units into the skin every morning. 30 mL 11  . isosorbide mononitrate (IMDUR) 30 MG 24 hr tablet Take 30 mg by mouth daily.    Marland Kitchen loperamide (IMODIUM) 2 MG capsule Take 6 mg by mouth daily as needed for diarrhea or loose stools.     Marland Kitchen LORazepam (ATIVAN) 0.5 MG tablet TAKE 1 TABLET(0.5 MG) BY MOUTH TWICE DAILY AS NEEDED FOR ANXIETY (Patient taking differently: Take 0.5 mg by mouth 2 (two) times daily as needed for anxiety.) 60 tablet 1  . metoCLOPramide (REGLAN) 5 MG tablet Take 1 tablet (5 mg total) by mouth in the morning and at bedtime. Please keep your January appt for further refills. Thank you 60 tablet 0  . mirtazapine (REMERON) 30 MG tablet TAKE 1 TABLET BY MOUTH  AT  BEDTIME (Patient taking differently: Take 30 mg by mouth at bedtime.) 30 tablet 11  . ondansetron (ZOFRAN) 4 MG tablet Take 1 tablet (4 mg total) by mouth every 8 (eight) hours as needed for nausea or vomiting. 20 tablet 0  . pantoprazole (PROTONIX) 40 MG tablet Take 1 tablet (40 mg total) by mouth 2 (two) times daily. 180 tablet 2  . promethazine (PHENERGAN) 25 MG suppository Place 1 suppository (25 mg total) rectally every 6 (six) hours as needed for nausea or vomiting. 12 each 0  . promethazine (PHENERGAN) 25 MG tablet Take 1 tablet (25 mg total) by mouth every 6 (six) hours as needed for nausea or vomiting. 30 tablet 0  . scopolamine (TRANSDERM-SCOP) 1 MG/3DAYS Place 1 patch onto the skin every 3 (three) days.    . sucralfate (CARAFATE) 1 GM/10ML suspension Take 10 mLs (1 g total) by mouth every 6 (six) hours as needed. (Patient taking differently: Take 1 g by mouth every 6 (six) hours as needed (stomach).) 420 mL 1  . cyclobenzaprine (FLEXERIL) 10 MG tablet TAKE 1 TABLET(10 MG) BY MOUTH THREE TIMES DAILY AS NEEDED  FOR MUSCLE SPASMS 30 tablet 0   No current facility-administered medications for this visit.    Allergies-reviewed and updated Allergies  Allergen Reactions  . Other Anaphylaxis    Reaction to [place] nuts   . Lactose Intolerance ([place]) Diarrhea    Social History   Socioeconomic History  . Marital status: Married    Spouse name: sergio  . Number of children: 0  . Years of education: college  . Highest education level: Not on file  Occupational History  . Occupation: [employer] call center    Employer: [employer]  Tobacco Use  . Smoking status: Never Smoker  . Smokeless tobacco: Never Used  Vaping Use  . Vaping Use: Never used  Substance and Sexual Activity  . Alcohol use: No    Alcohol/week: 0.0 standard drinks  . Drug use: Yes    Types: Marijuana  . Sexual activity: Not Currently    Partners: Male    Birth control/protection: None  Other Topics Concern  . Not on file  Social History Narrative  . Not on file   Social Determinants of Health   Financial Resource Strain: Not on file  Food Insecurity: Not on file  Transportation Needs: Not on file  Physical Activity: Not on file  Stress: Not on file  Social Connections: Not on file        Objective:  Physical Exam: Ht _0  (1.702 m)   Wt 130 lb (59 kg)   BMI 20.36 kg/m   Gen: NAD, resting comfortably Neuro: Grossly normal, moves all extremities Psych: Normal affect and thought content  Virtual Visit via Video   I connected with [name] on [birth year] at  3:40 PM EST by a video enabled telemedicine application and verified that I am speaking with the correct person using two identifiers. The limitations of evaluation and management by telemedicine and the availability of in person appointments were discussed. The patient expressed understanding and agreed to proceed.   Patient location: Home Provider location: [place] participating in the virtual visit:  Myself and Patient     Algis [place]. Jerline Pain, MD 04/02/2020 3:51 PM

## 2020-04-02 NOTE — Assessment & Plan Note (Signed)
Will place referral to behavioral health.

## 2020-04-03 ENCOUNTER — Telehealth: Payer: Self-pay

## 2020-04-03 NOTE — Telephone Encounter (Signed)
[facility]  States the received a referral for pt for an abnormal ultrasound, but they received no images.   Fax [phone]

## 2020-04-04 NOTE — Telephone Encounter (Addendum)
Dr. Jerline Pain was able to find this [place] in [place] and print it for me-faxed to [facility] using the number given

## 2020-04-04 NOTE — Telephone Encounter (Signed)
See below

## 2020-04-05 ENCOUNTER — Encounter (HOSPITAL_COMMUNITY): Payer: Self-pay

## 2020-04-05 ENCOUNTER — Other Ambulatory Visit: Payer: Self-pay

## 2020-04-05 DIAGNOSIS — L84 Corns and callosities: Secondary | ICD-10-CM | POA: Insufficient documentation

## 2020-04-05 DIAGNOSIS — U071 COVID-19: Secondary | ICD-10-CM | POA: Insufficient documentation

## 2020-04-05 DIAGNOSIS — Z8616 Personal history of COVID-19: Secondary | ICD-10-CM | POA: Insufficient documentation

## 2020-04-05 DIAGNOSIS — Z5321 Procedure and treatment not carried out due to patient leaving prior to being seen by health care provider: Secondary | ICD-10-CM | POA: Diagnosis not present

## 2020-04-05 DIAGNOSIS — R112 Nausea with vomiting, unspecified: Secondary | ICD-10-CM | POA: Diagnosis present

## 2020-04-05 NOTE — ED Triage Notes (Signed)
Pt reports husband tested positive for covid 2 days ago. She reports n/v. Pt also c/o callous on right foot that she suspects is infected.

## 2020-04-06 ENCOUNTER — Emergency Department (HOSPITAL_COMMUNITY)
Admission: EM | Admit: 2020-04-06 | Discharge: 2020-04-06 | Disposition: A | Discharge status: Home / Self Care | Payer: 59 | Attending: Emergency Medicine | Admitting: Emergency Medicine

## 2020-04-06 ENCOUNTER — Ambulatory Visit: Payer: 59 | Admitting: Gastroenterology

## 2020-04-06 ENCOUNTER — Telehealth (INDEPENDENT_AMBULATORY_CARE_PROVIDER_SITE_OTHER): Payer: 59 | Admitting: Family Medicine

## 2020-04-06 DIAGNOSIS — U071 COVID-19: Secondary | ICD-10-CM

## 2020-04-06 LAB — SARS CORONAVIRUS 2 (TAT 6-24 HRS): SARS Coronavirus 2: POSITIVE — AB

## 2020-04-06 MED ORDER — ONDANSETRON 4 MG PO TBDP: 4.0000 mg | ORAL_TABLET | Freq: Three times a day (TID) | ORAL | 0 refills | Status: DC | PRN

## 2020-04-06 NOTE — Patient Instructions (Addendum)
  HOME CARE TIPS:  -seek in person care promptly, particularly if you are unable to tolerate oral intake or keep down fluids, you are having difficulty breathing, dizziness, chest pain or your symptoms are worsening or not improving despite the treatment  -I sent the medication(s) we discussed to your pharmacy: Meds ordered this encounter  Medications  . ondansetron (ZOFRAN ODT) 4 MG disintegrating tablet    Sig: Take 1 tablet (4 mg total) by mouth every 8 (eight) hours as needed for nausea or vomiting.    Dispense:  10 tablet    Refill:  0    -drink fluids with electrolytes   -can use tylenol or aleve if needed for fevers, aches and pains per instructions  -can use nasal saline a few times per day if nasal congestion, sometime a short course of Afrin nasal spray for 3 days can help as well  -stay hydrated, drink plenty of fluids and eat small healthy meals - avoid dairy  -check out the [facility] website for more information on home care, transmission and treatment for COVID19  -follow up with your doctor in 2-3 days unless improving and feeling better  -stay home while sick, except to seek medical care, stay home for a full 10 days since the onset of symptoms PLUS one day of no fever and feeling better.  It was nice to meet you today, and I really hope you are feeling better soon. I help [place] out with telemedicine visits on Tuesdays and Thursdays and am available for visits on those days. If you have any concerns or questions following this visit please schedule a follow up visit with your Primary Care doctor or seek care at a local urgent care clinic to avoid delays in care.    Seek in person care promptly if your symptoms worsen, new concerns arise or you are not improving with treatment. Call 911 and/or seek emergency care if you symptoms are severe or life threatening.

## 2020-04-06 NOTE — ED Notes (Signed)
Pt called 3x for room placement. Eloped from waiting area.

## 2020-04-06 NOTE — Progress Notes (Signed)
Virtual Visit via Video Note  I connected with [name]  on [birth year] at  2:40 PM EST by a video enabled telemedicine application and verified that I am speaking with the correct person using two identifiers.  Location patient: home, [place] Location provider:work or home office Persons participating in the virtual visit: patient, provider, [name] and [name]  I discussed the limitations of evaluation and management by telemedicine and the availability of in person appointments. The patient expressed understanding and agreed to proceed.   HPI:  Acute telemedicine visit for covid exposure and covid like symptoms: -[name] tested positive for covid on the 4th -Onset:symptoms onset the 5th -Symptoms include: nausea, vomiting, reports can't keep fluids down, headache, fever high of 100.5, mild sob and CP intermittently she feels is more related to anxiety than to the illness -she and [name] have been driving around to emergency rooms and urgent cares, however many urgent cares will not see Kathryn because she has Covid and the emergency room wait wasso long that they left -Denies: difficulty breathing, diarrhea, dizziness, inability to get out of bed, severe ha -Has tried:can't keep anything down -Pertinent past medical history: diabetes - sugars running in the low 200s -Pertinent medication allergies: nkda -COVID-19 vaccine status: fully vaccinated with pfizer -fdlmp 2 weeks ago, denies any chance of pregnancy  ROS: See pertinent positives and negatives per HPI.  Past Medical History:  Diagnosis Date  . Allergy   . Anemia   . Anxiety   . Blood transfusion without reported diagnosis    Dec 2018  . Cataract    right eye  . COVID-19   . Depression   . Diabetes type 1, uncontrolled ([place]) 11/14/2011   Since age 67  . Fibromyalgia   . Gastroparesis   . GERD (gastroesophageal reflux disease)   . Hypertension   . Infection    UTI April 2016    Past Surgical History:  Procedure Laterality  Date  . ANKLE SURGERY    . CHOLECYSTECTOMY  11/15/2011   Procedure: LAPAROSCOPIC CHOLECYSTECTOMY WITH INTRAOPERATIVE CHOLANGIOGRAM;  Surgeon: [name], MD;  [facility];  Service: General;  Laterality: N/A;  . COLONOSCOPY    . COLONOSCOPY WITH PROPOFOL N/A 06/27/2017   Procedure: COLONOSCOPY WITH PROPOFOL;  Surgeon: Milus Banister, MD;  [facility];  Service: Endoscopy;  Laterality: N/A;  . ESOPHAGOGASTRODUODENOSCOPY  12/03/2011   Procedure: ESOPHAGOGASTRODUODENOSCOPY (EGD);  Surgeon: Beryle Beams, MD;  [facility]: [facility];  Service: Endoscopy;  Laterality: N/A;  . FLEXIBLE SIGMOIDOSCOPY N/A 03/10/2017   Procedure: FLEXIBLE SIGMOIDOSCOPY;  Surgeon: Carol Ada, MD;  [facility];  Service: Endoscopy;  Laterality: N/A;  . INCISION AND DRAINAGE PERIRECTAL ABSCESS N/A 03/01/2017   Procedure: IRRIGATION AND DEBRIDEMENT PERIRECTAL ABSCESS;  Surgeon: Alphonsa Overall, MD;  [facility];  Service: General;  Laterality: N/A;  . IRRIGATION AND DEBRIDEMENT BUTTOCKS N/A 03/23/2017   Procedure: IRRIGATION AND DEBRIDEMENT BUTTOCKS, SETON PLACEMENT;  Surgeon: [name], MD;  [facility];  Service: General;  Laterality: N/A;  . LAPAROSCOPY  11/23/2011   Procedure: LAPAROSCOPY DIAGNOSTIC;  Surgeon: Edward Jolly, MD;  [facility];  Service: General;  Laterality: N/A;  . SIGMOIDOSCOPY    . UPPER GASTROINTESTINAL ENDOSCOPY    . WISDOM TOOTH EXTRACTION       Current Outpatient Medications:  .  ondansetron (ZOFRAN ODT) 4 MG disintegrating tablet, Take 1 tablet (4 mg total) by mouth every 8 (eight) hours as needed for nausea or vomiting., Disp: 10  tablet, Rfl: 0 .  aluminum-magnesium hydroxide-simethicone (MAALOX) 161-096-04 MG/5ML SUSP, Take 30 mLs by mouth 3 (three) times daily as needed (indigestion)., Disp: , Rfl:  .  busPIRone (BUSPAR) 5 MG tablet, Take 1 tablet (5 mg total) by mouth 3 (three) times daily., Disp: 90 tablet, Rfl: 5 .  carvedilol  (COREG) 6.25 MG tablet, Take 6.25 mg by mouth 2 (two) times daily with a meal., Disp: , Rfl:  .  Continuous Blood Gluc Sensor (DEXCOM G6 SENSOR) MISC, USE AS DIRECTED WITH CONTINUOUS BLOOD GLUCOSE MONITOR. REPLACE EVERY 10 DAYS, Disp: 3 each, Rfl: 1 .  Continuous Blood Gluc Transmit (DEXCOM G6 TRANSMITTER) MISC, USE AS DIRECTED TO  MONITOR  GLUCOSE  LEVELS, Disp: 1 each, Rfl: 0 .  cyclobenzaprine (FLEXERIL) 10 MG tablet, TAKE 1 TABLET(10 MG) BY MOUTH THREE TIMES DAILY AS NEEDED FOR MUSCLE SPASMS, Disp: 30 tablet, Rfl: 0 .  dicyclomine (BENTYL) 20 MG tablet, Take 1 tablet (20 mg total) by mouth every 8 (eight) hours as needed for spasms., Disp: 15 tablet, Rfl: 0 .  hydrALAZINE (APRESOLINE) 50 MG tablet, TAKE 1 TABLET(50 MG) BY MOUTH EVERY 6 HOURS (Patient taking differently: Take 50 mg by mouth every 6 (six) hours.), Disp: 120 tablet, Rfl: 0 .  Insulin NPH, Human,, Isophane, (HUMULIN N KWIKPEN) 100 UNIT/ML Kiwkpen, Inject 20 Units into the skin every morning., Disp: 30 mL, Rfl: 11 .  isosorbide mononitrate (IMDUR) 30 MG 24 hr tablet, Take 30 mg by mouth daily., Disp: , Rfl:  .  loperamide (IMODIUM) 2 MG capsule, Take 6 mg by mouth daily as needed for diarrhea or loose stools. , Disp: , Rfl:  .  LORazepam (ATIVAN) 0.5 MG tablet, TAKE 1 TABLET(0.5 MG) BY MOUTH TWICE DAILY AS NEEDED FOR ANXIETY (Patient taking differently: Take 0.5 mg by mouth 2 (two) times daily as needed for anxiety.), Disp: 60 tablet, Rfl: 1 .  metoCLOPramide (REGLAN) 5 MG tablet, Take 1 tablet (5 mg total) by mouth in the morning and at bedtime. Please keep your January appt for further refills. Thank you, Disp: 60 tablet, Rfl: 0 .  mirtazapine (REMERON) 30 MG tablet, TAKE 1 TABLET BY MOUTH AT  BEDTIME (Patient taking differently: Take 30 mg by mouth at bedtime.), Disp: 30 tablet, Rfl: 11 .  pantoprazole (PROTONIX) 40 MG tablet, Take 1 tablet (40 mg total) by mouth 2 (two) times daily., Disp: 180 tablet, Rfl: 2 .  promethazine  (PHENERGAN) 25 MG suppository, Place 1 suppository (25 mg total) rectally every 6 (six) hours as needed for nausea or vomiting., Disp: 12 each, Rfl: 0 .  promethazine (PHENERGAN) 25 MG tablet, Take 1 tablet (25 mg total) by mouth every 6 (six) hours as needed for nausea or vomiting., Disp: 30 tablet, Rfl: 0 .  scopolamine (TRANSDERM-SCOP) 1 MG/3DAYS, Place 1 patch onto the skin every 3 (three) days., Disp: , Rfl:  .  sucralfate (CARAFATE) 1 GM/10ML suspension, Take 10 mLs (1 g total) by mouth every 6 (six) hours as needed. (Patient taking differently: Take 1 g by mouth every 6 (six) hours as needed (stomach).), Disp: 420 mL, Rfl: 1  EXAM:  VITALS per patient if applicable: None  GENERAL: alert, oriented, appears well and in no acute distress initially, she is riding in the car, she does start vomiting towards the end of this visit  HEENT: atraumatic, conjunttiva clear, no obvious abnormalities on inspection of external nose and ears  NECK: normal movements of the head and neck  LUNGS: on inspection  no signs of respiratory distress, breathing rate appears normal, no obvious gross SOB, gasping or wheezing  CV: no obvious cyanosis  MS: moves all visible extremities without noticeable abnormality, is able to ambulate per report  PSYCH/NEURO: pleasant and cooperative, no obvious depression or anxiety, speech and thought processing grossly intact  ASSESSMENT AND PLAN:  Discussed the following assessment and plan:  COVID-19  -we discussed possible serious and likely etiologies, options for evaluation and workup, limitations of telemedicine visit vs in person visit, treatment, treatment risks and precautions. Pt prefers to treat via telemedicine empirically rather than in person at this moment.  Given the vomiting, difficulty tolerating oral intake and a report of mild chest discomfort with mild shortness of breath, did advise in person evaluation.  However due to the current state of the  pandemic, she and [name] report they have been driving around trying to find an urgent care that we will see Kathryn, but she has been refused.  She reports ports they also tried to go to an emergency room, but the wait was so long and she was throwing up so left.  They plan to continue to seek an option for in person care, discussed many, in the interim we will send in Zofran ODT to pick up now and take so that she can try to get some fluids with electrolytes in that will stay down.  Reports she has been sipping fluids, but they will come back up.  She feels like if she could take some Zofran, that perhaps she could wait for a visit today in person at [facility].  Discussed if she starts to feel worse, is having difficulty breathing, worsening chest pain, dizziness, or severe symptoms she may need to seek emergency care or call 911.  Declines EMS or emergency transport currently. Discussed treatment options for covid, currently limitations on supply, potential complications, isolation and precuations. Advised close follow up with PCP office.  Scheduled follow up with PCP offered: Sent message to schedulers to assist and advised patient to contact PCP office to schedule if does not receive call back in next 24 hours. Advised to seek prompt in person care if worsening, new symptoms arise, or if is not improving with treatment. Discussed options for inperson care if PCP office not available. Did let this patient know that I only do telemedicine on Tuesdays and Thursdays for [place]. Advised to schedule follow up visit with PCP or [facility] if any further questions or concerns to avoid delays in care.   I discussed the assessment and treatment plan with the patient. The patient was provided an opportunity to ask questions and all were answered. The patient agreed with the plan and demonstrated an understanding of the instructions.     Lucretia Kern, DO

## 2020-04-08 ENCOUNTER — Encounter (HOSPITAL_COMMUNITY): Payer: Self-pay

## 2020-04-08 ENCOUNTER — Emergency Department (HOSPITAL_COMMUNITY)
Admission: EM | Admit: 2020-04-08 | Discharge: 2020-04-08 | Disposition: A | Discharge status: Home / Self Care | Payer: 59 | Source: Home / Self Care

## 2020-04-08 ENCOUNTER — Encounter: Payer: Self-pay | Admitting: Nurse Practitioner

## 2020-04-08 ENCOUNTER — Other Ambulatory Visit: Payer: Self-pay

## 2020-04-08 DIAGNOSIS — Z5321 Procedure and treatment not carried out due to patient leaving prior to being seen by health care provider: Secondary | ICD-10-CM | POA: Insufficient documentation

## 2020-04-08 DIAGNOSIS — I10 Essential (primary) hypertension: Secondary | ICD-10-CM | POA: Insufficient documentation

## 2020-04-08 DIAGNOSIS — L84 Corns and callosities: Secondary | ICD-10-CM | POA: Insufficient documentation

## 2020-04-08 DIAGNOSIS — E10621 Type 1 diabetes mellitus with foot ulcer: Secondary | ICD-10-CM | POA: Diagnosis not present

## 2020-04-08 NOTE — ED Triage Notes (Signed)
Patient states she is Covid positive. Patient also reports a callus on her right foot. Patient has swelling and a broken skin area on her right shin.

## 2020-04-09 ENCOUNTER — Other Ambulatory Visit: Payer: Self-pay

## 2020-04-09 ENCOUNTER — Encounter (HOSPITAL_BASED_OUTPATIENT_CLINIC_OR_DEPARTMENT_OTHER): Payer: Self-pay | Admitting: Emergency Medicine

## 2020-04-09 ENCOUNTER — Telehealth: Payer: Self-pay

## 2020-04-09 ENCOUNTER — Inpatient Hospital Stay (HOSPITAL_BASED_OUTPATIENT_CLINIC_OR_DEPARTMENT_OTHER)
Admission: EM | Admit: 2020-04-09 | Discharge: 2020-04-16 | DRG: 616 | Disposition: A | Discharge status: Home / Self Care | Payer: 59 | Attending: Internal Medicine | Admitting: Internal Medicine

## 2020-04-09 ENCOUNTER — Emergency Department (HOSPITAL_BASED_OUTPATIENT_CLINIC_OR_DEPARTMENT_OTHER): Payer: 59

## 2020-04-09 DIAGNOSIS — R109 Unspecified abdominal pain: Secondary | ICD-10-CM

## 2020-04-09 DIAGNOSIS — E871 Hypo-osmolality and hyponatremia: Secondary | ICD-10-CM | POA: Diagnosis present

## 2020-04-09 DIAGNOSIS — Z79899 Other long term (current) drug therapy: Secondary | ICD-10-CM | POA: Diagnosis not present

## 2020-04-09 DIAGNOSIS — Z833 Family history of diabetes mellitus: Secondary | ICD-10-CM | POA: Diagnosis not present

## 2020-04-09 DIAGNOSIS — Z91018 Allergy to other foods: Secondary | ICD-10-CM | POA: Diagnosis not present

## 2020-04-09 DIAGNOSIS — Z91011 Allergy to milk products: Secondary | ICD-10-CM

## 2020-04-09 DIAGNOSIS — Z794 Long term (current) use of insulin: Secondary | ICD-10-CM | POA: Diagnosis not present

## 2020-04-09 DIAGNOSIS — I129 Hypertensive chronic kidney disease with stage 1 through stage 4 chronic kidney disease, or unspecified chronic kidney disease: Secondary | ICD-10-CM | POA: Diagnosis present

## 2020-04-09 DIAGNOSIS — K3184 Gastroparesis: Secondary | ICD-10-CM | POA: Diagnosis not present

## 2020-04-09 DIAGNOSIS — I160 Hypertensive urgency: Secondary | ICD-10-CM | POA: Diagnosis present

## 2020-04-09 DIAGNOSIS — T148XXA Other injury of unspecified body region, initial encounter: Secondary | ICD-10-CM

## 2020-04-09 DIAGNOSIS — F419 Anxiety disorder, unspecified: Secondary | ICD-10-CM | POA: Diagnosis not present

## 2020-04-09 DIAGNOSIS — K219 Gastro-esophageal reflux disease without esophagitis: Secondary | ICD-10-CM | POA: Diagnosis present

## 2020-04-09 DIAGNOSIS — F064 Anxiety disorder due to known physiological condition: Secondary | ICD-10-CM | POA: Diagnosis present

## 2020-04-09 DIAGNOSIS — E10621 Type 1 diabetes mellitus with foot ulcer: Secondary | ICD-10-CM | POA: Diagnosis present

## 2020-04-09 DIAGNOSIS — L03119 Cellulitis of unspecified part of limb: Secondary | ICD-10-CM | POA: Diagnosis present

## 2020-04-09 DIAGNOSIS — G4700 Insomnia, unspecified: Secondary | ICD-10-CM | POA: Diagnosis present

## 2020-04-09 DIAGNOSIS — U071 COVID-19: Secondary | ICD-10-CM

## 2020-04-09 DIAGNOSIS — E1069 Type 1 diabetes mellitus with other specified complication: Secondary | ICD-10-CM | POA: Diagnosis not present

## 2020-04-09 DIAGNOSIS — D649 Anemia, unspecified: Secondary | ICD-10-CM | POA: Diagnosis present

## 2020-04-09 DIAGNOSIS — M868X7 Other osteomyelitis, ankle and foot: Secondary | ICD-10-CM | POA: Diagnosis not present

## 2020-04-09 DIAGNOSIS — F32A Depression, unspecified: Secondary | ICD-10-CM | POA: Diagnosis not present

## 2020-04-09 DIAGNOSIS — E1043 Type 1 diabetes mellitus with diabetic autonomic (poly)neuropathy: Secondary | ICD-10-CM | POA: Diagnosis present

## 2020-04-09 DIAGNOSIS — Z6820 Body mass index (BMI) 20.0-20.9, adult: Secondary | ICD-10-CM

## 2020-04-09 DIAGNOSIS — N1831 Chronic kidney disease, stage 3a: Secondary | ICD-10-CM | POA: Diagnosis not present

## 2020-04-09 DIAGNOSIS — E11621 Type 2 diabetes mellitus with foot ulcer: Secondary | ICD-10-CM

## 2020-04-09 DIAGNOSIS — L03115 Cellulitis of right lower limb: Secondary | ICD-10-CM | POA: Diagnosis present

## 2020-04-09 DIAGNOSIS — E1022 Type 1 diabetes mellitus with diabetic chronic kidney disease: Secondary | ICD-10-CM | POA: Diagnosis present

## 2020-04-09 DIAGNOSIS — E43 Unspecified severe protein-calorie malnutrition: Secondary | ICD-10-CM | POA: Diagnosis not present

## 2020-04-09 DIAGNOSIS — L97509 Non-pressure chronic ulcer of other part of unspecified foot with unspecified severity: Secondary | ICD-10-CM | POA: Diagnosis present

## 2020-04-09 DIAGNOSIS — M797 Fibromyalgia: Secondary | ICD-10-CM | POA: Diagnosis not present

## 2020-04-09 DIAGNOSIS — E611 Iron deficiency: Secondary | ICD-10-CM | POA: Diagnosis present

## 2020-04-09 DIAGNOSIS — L97519 Non-pressure chronic ulcer of other part of right foot with unspecified severity: Secondary | ICD-10-CM | POA: Diagnosis present

## 2020-04-09 DIAGNOSIS — L089 Local infection of the skin and subcutaneous tissue, unspecified: Secondary | ICD-10-CM

## 2020-04-09 DIAGNOSIS — L02611 Cutaneous abscess of right foot: Secondary | ICD-10-CM | POA: Diagnosis not present

## 2020-04-09 DIAGNOSIS — D509 Iron deficiency anemia, unspecified: Secondary | ICD-10-CM | POA: Diagnosis present

## 2020-04-09 DIAGNOSIS — N183 Chronic kidney disease, stage 3 unspecified: Secondary | ICD-10-CM | POA: Diagnosis present

## 2020-04-09 DIAGNOSIS — E1065 Type 1 diabetes mellitus with hyperglycemia: Secondary | ICD-10-CM

## 2020-04-09 DIAGNOSIS — E109 Type 1 diabetes mellitus without complications: Secondary | ICD-10-CM | POA: Diagnosis present

## 2020-04-09 DIAGNOSIS — M869 Osteomyelitis, unspecified: Secondary | ICD-10-CM

## 2020-04-09 DIAGNOSIS — L02619 Cutaneous abscess of unspecified foot: Secondary | ICD-10-CM | POA: Diagnosis present

## 2020-04-09 DIAGNOSIS — Z8249 Family history of ischemic heart disease and other diseases of the circulatory system: Secondary | ICD-10-CM

## 2020-04-09 LAB — COMPREHENSIVE METABOLIC PANEL
ALT: 12 U/L (ref 0–44)
AST: 15 U/L (ref 15–41)
Albumin: 2.4 g/dL — ABNORMAL LOW (ref 3.5–5.0)
Alkaline Phosphatase: 75 U/L (ref 38–126)
Anion gap: 8 (ref 5–15)
BUN: 23 mg/dL — ABNORMAL HIGH (ref 6–20)
CO2: 25 mmol/L (ref 22–32)
Calcium: 8.4 mg/dL — ABNORMAL LOW (ref 8.9–10.3)
Chloride: 98 mmol/L (ref 98–111)
Creatinine, Ser: 1.28 mg/dL — ABNORMAL HIGH (ref 0.44–1.00)
GFR, Estimated: 58 mL/min — ABNORMAL LOW (ref >60)
Glucose, Bld: 94 mg/dL (ref 70–99)
Potassium: 4.7 mmol/L (ref 3.5–5.1)
Sodium: 131 mmol/L — ABNORMAL LOW (ref 135–145)
Total Bilirubin: 0.6 mg/dL (ref 0.3–1.2)
Total Protein: 7.8 g/dL (ref 6.5–8.1)

## 2020-04-09 LAB — C-REACTIVE PROTEIN: CRP: 12.6 mg/dL — ABNORMAL HIGH (ref <1.0)

## 2020-04-09 LAB — CBC WITH DIFFERENTIAL/PLATELET
Abs Immature Granulocytes: 0.04 10*3/uL (ref 0.00–0.07)
Basophils Absolute: 0 10*3/uL (ref 0.0–0.1)
Basophils Relative: 0 %
Eosinophils Absolute: 0.1 10*3/uL (ref 0.0–0.5)
Eosinophils Relative: 2 %
HCT: 22.5 % — ABNORMAL LOW (ref 36.0–46.0)
Hemoglobin: 7.1 g/dL — ABNORMAL LOW (ref 12.0–15.0)
Immature Granulocytes: 1 %
Lymphocytes Relative: 19 %
Lymphs Abs: 1.4 10*3/uL (ref 0.7–4.0)
MCH: 27.6 pg (ref 26.0–34.0)
MCHC: 31.6 g/dL (ref 30.0–36.0)
MCV: 87.5 fL (ref 80.0–100.0)
Monocytes Absolute: 0.7 10*3/uL (ref 0.1–1.0)
Monocytes Relative: 9 %
Neutro Abs: 5.1 10*3/uL (ref 1.7–7.7)
Neutrophils Relative %: 69 %
Platelets: 239 10*3/uL (ref 150–400)
RBC: 2.57 MIL/uL — ABNORMAL LOW (ref 3.87–5.11)
RDW: 13 % (ref 11.5–15.5)
WBC: 7.3 10*3/uL (ref 4.0–10.5)
nRBC: 0 % (ref 0.0–0.2)

## 2020-04-09 LAB — CBG MONITORING, ED: Glucose-Capillary: 88 mg/dL (ref 70–99)

## 2020-04-09 LAB — LACTIC ACID, PLASMA
Lactic Acid, Venous: 0.6 mmol/L (ref 0.5–1.9)
Lactic Acid, Venous: 0.5 mmol/L (ref 0.5–1.9)

## 2020-04-09 LAB — SEDIMENTATION RATE: Sed Rate: >140 mm/hr — ABNORMAL HIGH (ref 0–22)

## 2020-04-09 IMAGING — DX DG FOOT COMPLETE 3+V*R*
3 series · 3 of 3 positions shown · non-contrast
Comparison: None.

CLINICAL DATA: Wound to right great toe

EXAM:
RIGHT FOOT COMPLETE - 3+ VIEW

[foot ap]
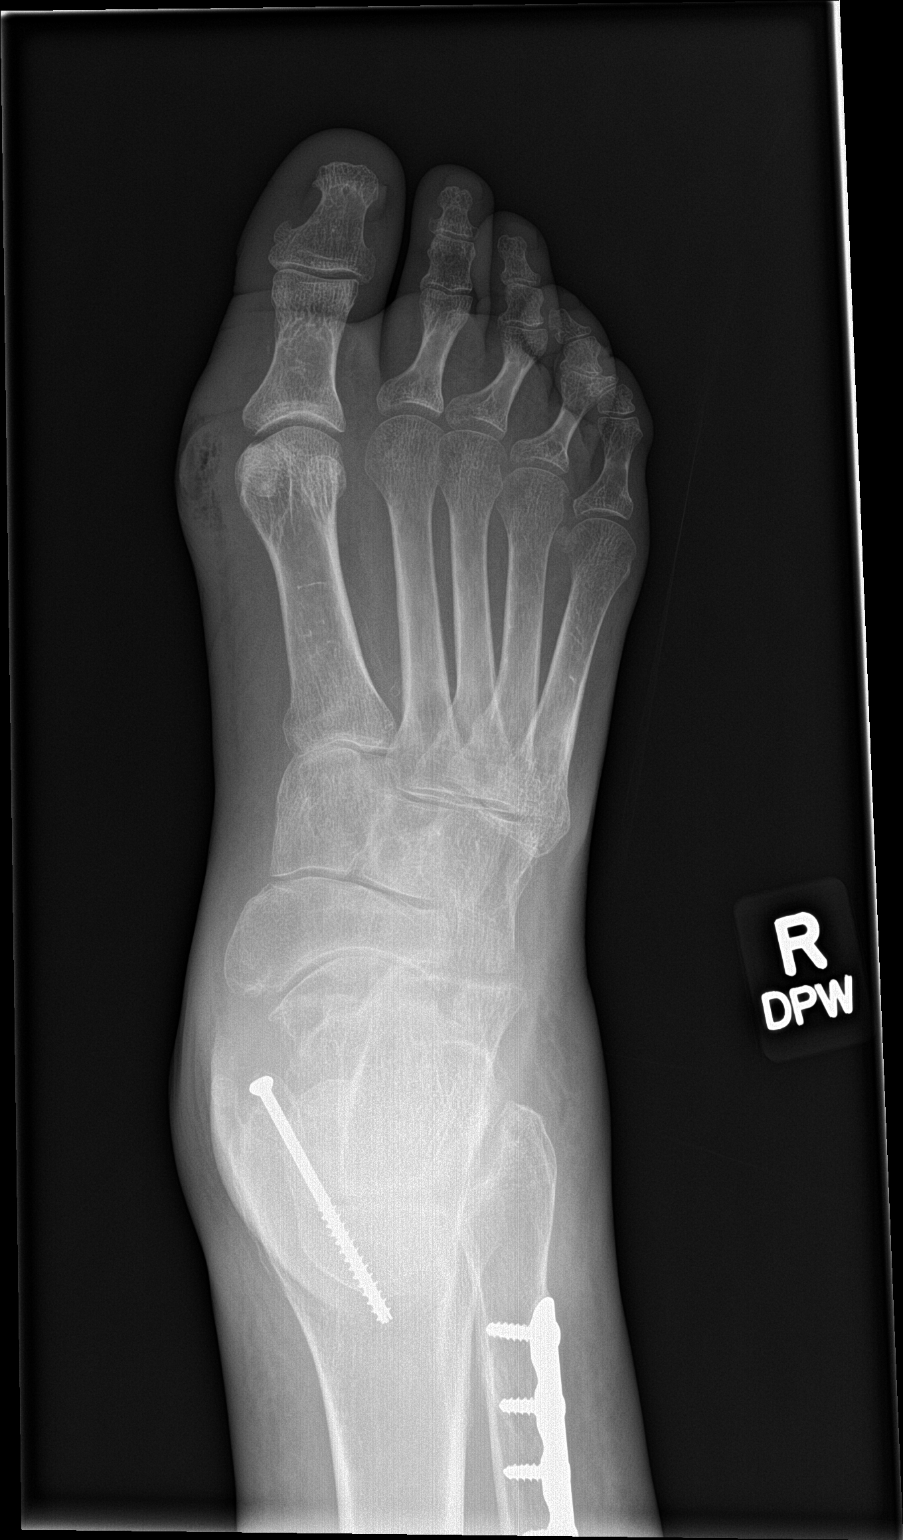

[foot obl]
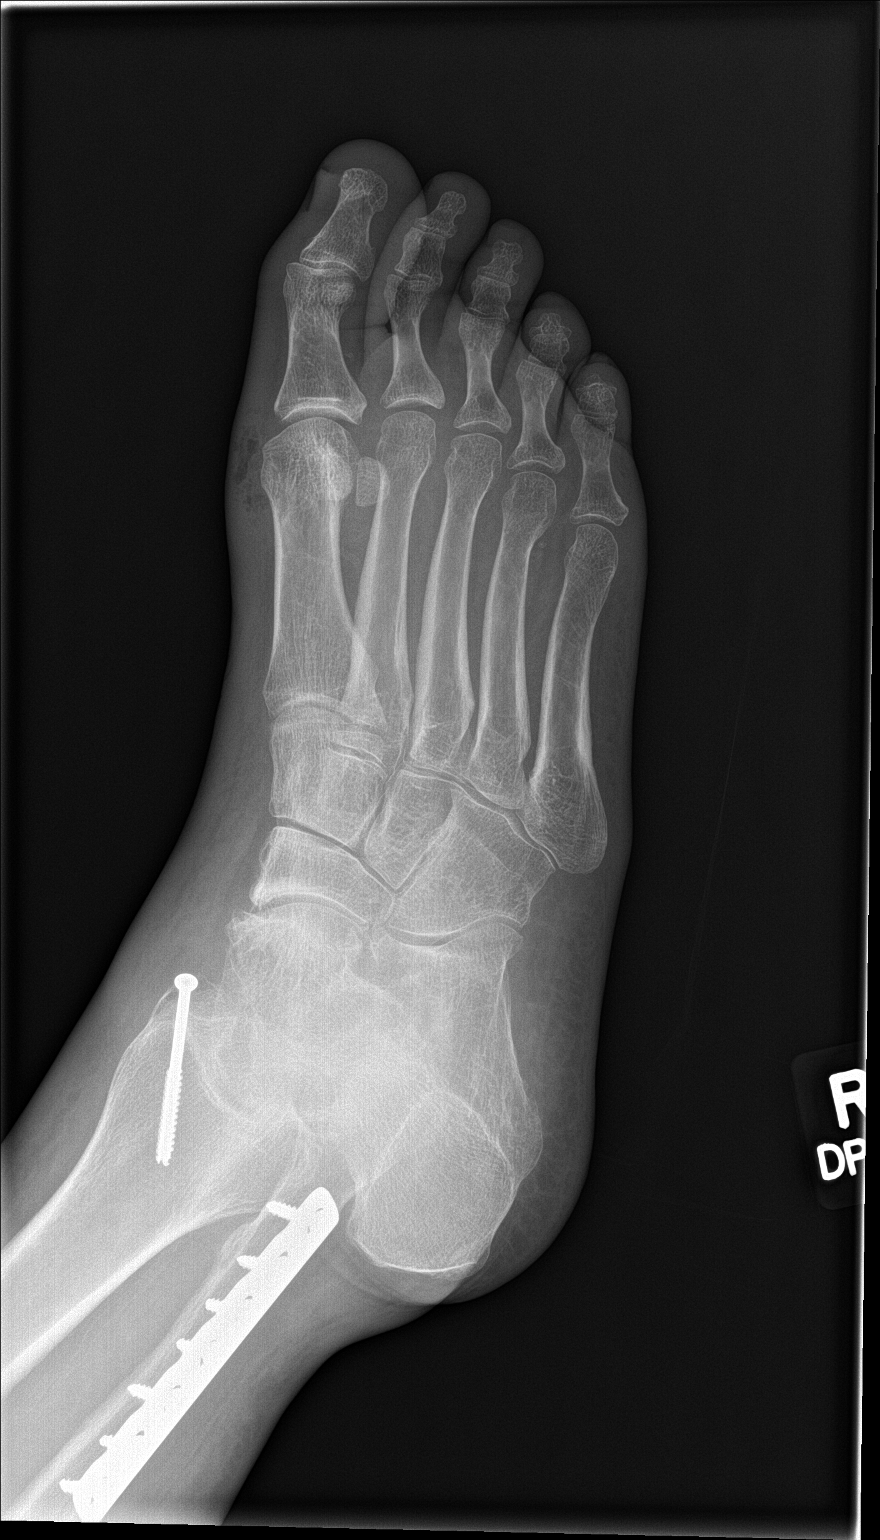

[foot lat]
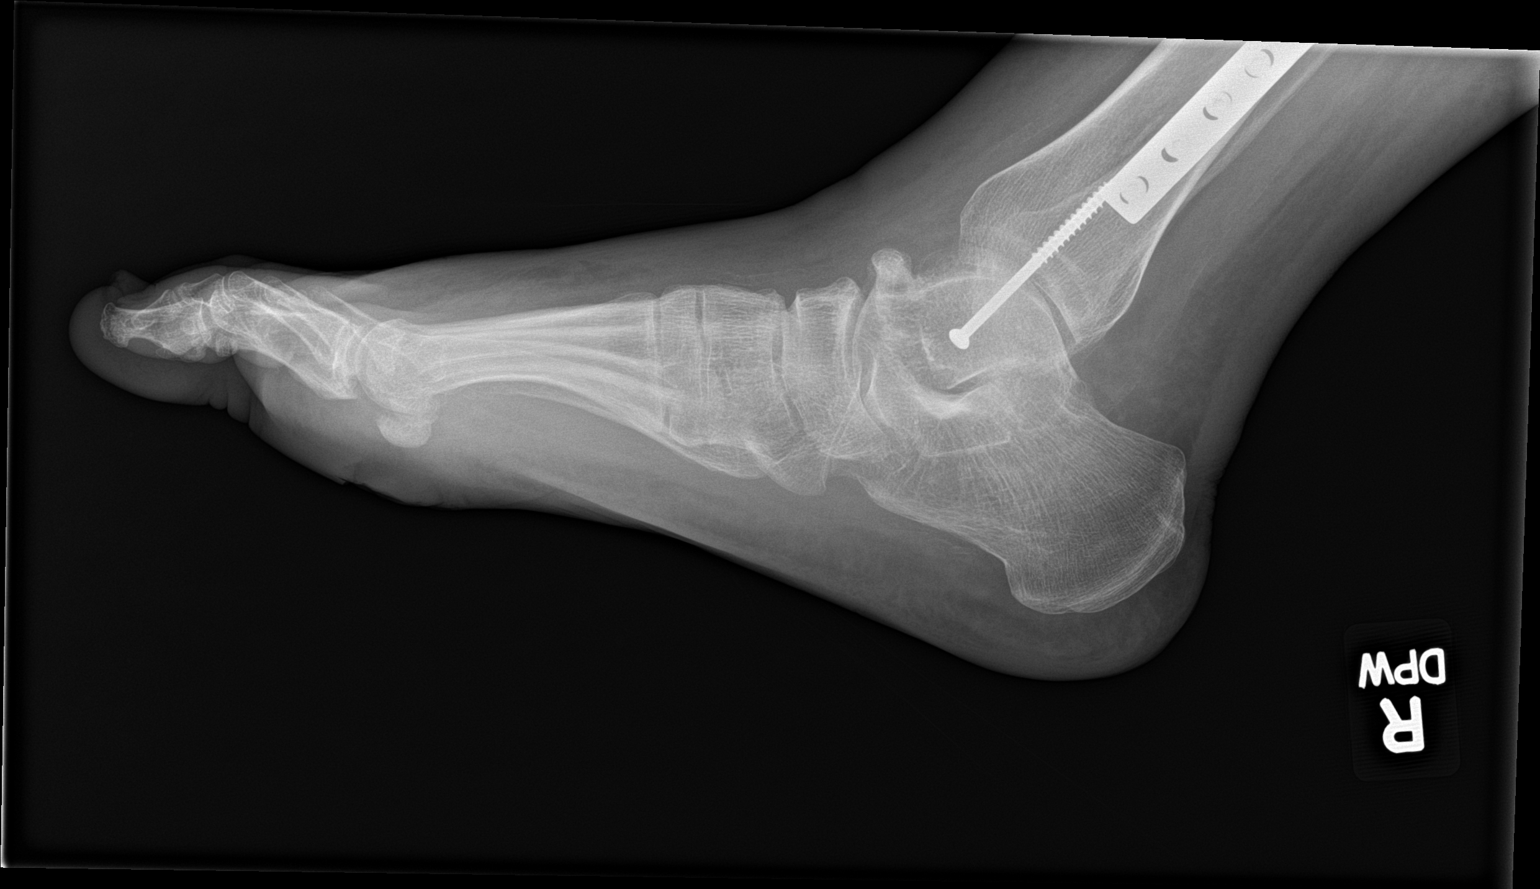

[3 of 3 positions shown; findings below may reference images not displayed]

FINDINGS: Soft tissue swelling with underlying soft tissue gas along the
medial aspect of the 1st metatarsal head. No underlying cortical
irregularity.

No fracture or dislocation is seen.

The joint spaces are preserved.

Postsurgical changes involving the distal tibia and fibula.
IMPRESSION: Soft tissue swelling with underlying soft tissue gas along the
medial aspect of the 1st metatarsal head.

No radiographic findings to suggests acute osteomyelitis.

## 2020-04-09 IMAGING — DX DG CHEST 1V PORT
1 series · 1 of 1 positions shown · non-contrast
Comparison: Multiple priors including CTA chest abdomen pelvis
[DATE] and chest radiograph [DATE].

CLINICAL DATA: COVID positive with wound infection of the foot.

EXAM:
PORTABLE CHEST 1 VIEW

[chest ap]
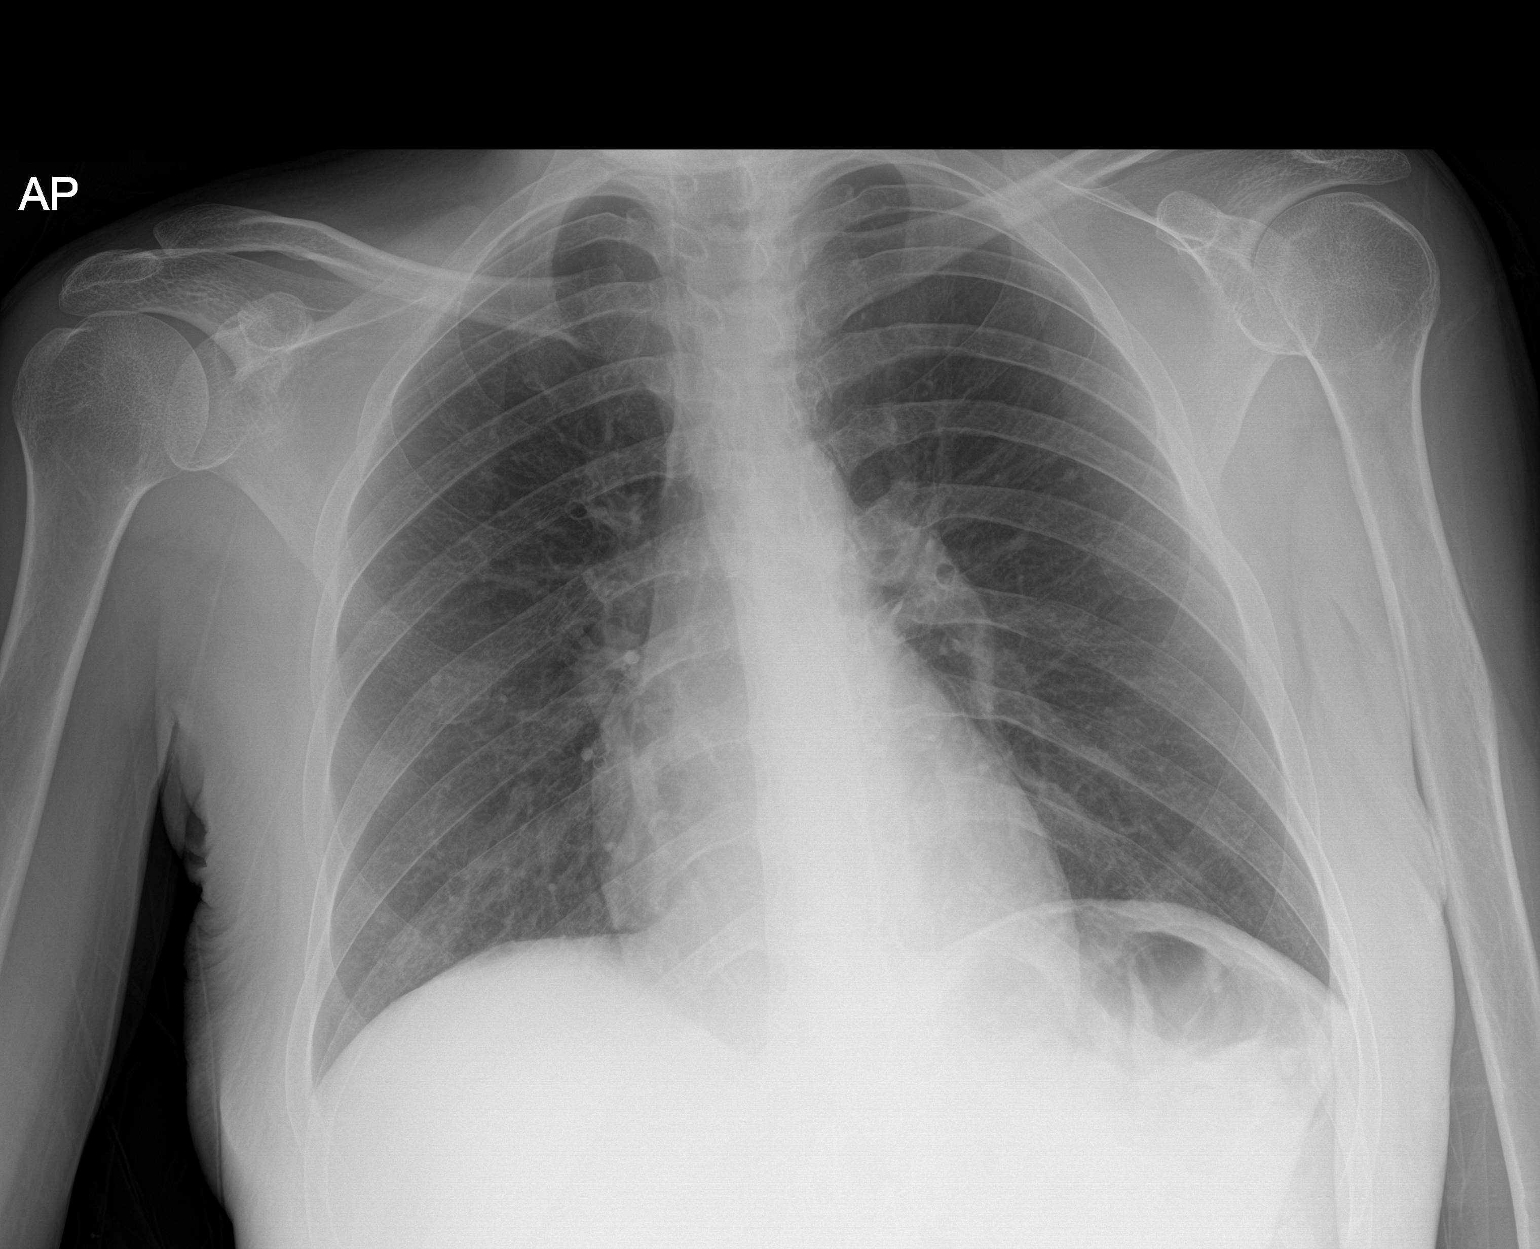

[1 of 1 positions shown; findings below may reference images not displayed]

FINDINGS: The heart size and mediastinal contours are within normal limits.
Both lungs are clear. The visualized skeletal structures are
unremarkable.
IMPRESSION: No active disease.

## 2020-04-09 MED ORDER — VANCOMYCIN HCL IN DEXTROSE 1-5 GM/200ML-% IV SOLN
1000.0000 mg | Freq: Once | INTRAVENOUS | Status: AC
  Administered 2020-04-09: 1000 mg via INTRAVENOUS
  Filled 2020-04-09: qty 200

## 2020-04-09 MED ORDER — SODIUM CHLORIDE 0.9 % IV SOLN
INTRAVENOUS | Status: DC | PRN
  Administered 2020-04-09: 100 mL via INTRAVENOUS
  Administered 2020-04-12: 1000 mL via INTRAVENOUS

## 2020-04-09 MED ORDER — OXYCODONE-ACETAMINOPHEN 5-325 MG PO TABS
1.0000 | ORAL_TABLET | Freq: Once | ORAL | Status: AC
  Administered 2020-04-09: 1 via ORAL
  Filled 2020-04-09: qty 1

## 2020-04-09 MED ORDER — PIPERACILLIN-TAZOBACTAM 3.375 G IVPB 30 MIN
3.3750 g | Freq: Once | INTRAVENOUS | Status: AC
  Administered 2020-04-09: 3.375 g via INTRAVENOUS
  Filled 2020-04-09: qty 50

## 2020-04-09 NOTE — Telephone Encounter (Signed)
Nurse Assessment Nurse: Hassell Done, RN, Melanie Date/Time Eilene Ghazi Time): 04/08/2020 8:22:52 PM Confirm and document reason for call. If symptomatic, describe symptoms. ---Caller states she has been diagnosed with covid and has a blister or callous on the bottom of her foot close to her big toe. Was diagnosed with covid 3-4 days ago. She went to the Dr on Wednesday and might be at the end of her quarantine. Went to UC and was told to go to ER. Does the patient have any new or worsening symptoms? ---Yes Will a triage be completed? ---Yes Related visit to physician within the last 2 weeks? ---Yes Does the PT have any chronic conditions? (i.e. diabetes, asthma, this includes High risk factors for pregnancy, etc.) ---Yes List chronic conditions. ---diabetes Is the patient pregnant or possibly pregnant? (Ask all females between the ages of 62-55) ---No Is this a behavioral health or substance abuse call? ---No Guidelines Guideline Title Affirmed Question Affirmed Notes Nurse Date/Time Eilene Ghazi Time) Diabetes - Foot Problems and Questions Severe pain Hassell Done RN, Threasa Beards 04/08/2020 8:27:02 PM Disp. Time Eilene Ghazi Time) Disposition Final User 04/08/2020 8:28:25 PM Go to ED Now Yes Hassell Done, RN, Melanie PLEASE NOTE: All timestamps contained within this report are represented as [place] Standard Time. CONFIDENTIALTY NOTICE: This fax transmission is intended only for the addressee. It contains information that is legally privileged, confidential or otherwise protected from use or disclosure. If you are not the intended recipient, you are strictly prohibited from reviewing, disclosing, copying using or disseminating any of this information or taking any action in reliance on or regarding this information. If you have received this fax in error, please notify us immediately by telephone so that we can arrange for its return to [place]. Phone: [phone], Toll-Free: [phone], Fax: [phone] Page: 2 of 2 Call  Id: [id number] [place] Disagree/Comply Comply Caller Understands Yes PreDisposition Call Doctor Care Advice Given Per Guideline GO TO ED NOW: * You need to be seen in the Emergency Department. * Go to the ED at ___________ [place]: * For pain relief, you can take either acetaminophen, ibuprofen, or naproxen. * They are over-the-counter (OTC) pain drugs. You can buy them at the drugstore. CARE ADVICE given per Diabetes - Foot Problems and Questions (Adult) guideline. Referrals GO TO FACILITY UNDECIDE

## 2020-04-09 NOTE — Telephone Encounter (Signed)
Claudia from [facility] for a Tourist information centre manager for patient. She is asking that when we see patient again to encourage her to call claudia. Rosemarie Ax is a case Freight forwarder who helps patients she there conditions under control, she is someone who helps patients follow drs.orders. it is free of cost with insurance

## 2020-04-09 NOTE — Telephone Encounter (Signed)
LVM to call back and get scheduled.

## 2020-04-09 NOTE — Telephone Encounter (Signed)
Patient called back in and told her to try to go to the [facility] so she can be evaluated for her callous that is infected.

## 2020-04-09 NOTE — ED Notes (Signed)
Ulceration to right shin, right bottom of foot below great toe, open wound, no drainage

## 2020-04-09 NOTE — Telephone Encounter (Signed)
Please call and schedule virtual for the blister.

## 2020-04-09 NOTE — Telephone Encounter (Signed)
FYI

## 2020-04-09 NOTE — ED Triage Notes (Signed)
Pt is positive for covid.  Pt has wound to right great toe on the side that she believes may be infected.  Pt is diabetic.

## 2020-04-09 NOTE — ED Provider Notes (Signed)
[facility]GENC[facility] DEPARTMENT Provider Note   CSN: [id number] Arrival date & time: 04/09/20  1231     History Chief Complaint  Patient presents with  . Wound Infection    Covid +    TRIVIA HEFFELFINGER is a 30 y.o. female.  JANISSA BERTRAM is a 29 y.o. female with a history of type 1 diabetes, gastroparesis, hypertension, depression, anxiety, with current COVID infection, who presents to the ED for evaluation of wound to the right foot.  Patient reports that she had a callus in this area over the ball of the foot just proximal to the great toe, but about a week ago noted that the skin seems to be breaking down in this area over the past week it has become increasingly painful, red and swollen with some purulent drainage, and she has become very concerned that it is infected.  She reports pain is started to radiate up her leg and her foot has become more swollen.  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 She has had some previous wound infections but has never had any amputations, is a type I diabetic states that her sugars have been doing okay recently, but she has had frequent admissions for DKA in the past.  She denies fevers, reports some intermittent chills but also currently has a COVID infection, seems to be having mild symptoms of low-grade fevers and body aches, and has been vaccinated.  Tested positive for COVID and has been having symptoms for the past 4 days.  No chest pain or shortness of breath.        Past Medical History:  Diagnosis Date  . Allergy   . Anemia   . Anxiety   . Blood transfusion without reported diagnosis    Dec 2018  . Cataract    right eye  . COVID-19 03/2020  . Depression   . Diabetes type 1, uncontrolled ([place]) 11/14/2011   Since age 71  . Fibromyalgia   . Gastroparesis   . GERD (gastroesophageal reflux disease)   . Hypertension   . Infection    UTI April 2016    Patient Active Problem List   Diagnosis Date Noted  .  Hypertension   . COVID-19 03/2020  . DKA (diabetic ketoacidosis) ([place]) 03/22/2020  . 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 Hypertensive urgency 04/30/2019  . Cannabinoid hyperemesis syndrome 04/30/2019  . Contraception management 08/06/2018  . Cyclical vomiting syndrome 06/07/2018  . Diabetic gastroparesis associated with type 1 diabetes mellitus ([place]) 06/04/2018  . Anxiety 05/31/2018  . Peripheral edema 05/31/2018  . Normocytic anemia 07/10/2017  . GERD (gastroesophageal reflux disease) 06/23/2017  . Intractable nausea and vomiting 04/11/2017  . MDD (major depressive disorder), recurrent episode, mild ([place]) 02/28/2017  . Diabetes mellitus type 1 ([place]) 04/28/2013  . Protein-calorie malnutrition, severe ([place]) 04/28/2013  . Hypertension associated with diabetes ([place]) 11/14/2011  . Diabetes type 1, uncontrolled ([place]) 11/14/2011  . Chronic abdominal pain 09/18/2011    Past Surgical History:  Procedure Laterality Date  . ANKLE SURGERY    . CHOLECYSTECTOMY  11/15/2011   Procedure: LAPAROSCOPIC CHOLECYSTECTOMY WITH INTRAOPERATIVE CHOLANGIOGRAM;  Surgeon: [name], MD;  Location: [facility];  Service: General;  Laterality: N/A;  . COLONOSCOPY    . COLONOSCOPY WITH PROPOFOL N/A 06/27/2017   Procedure: COLONOSCOPY WITH PROPOFOL;  Surgeon: Milus Banister, MD;  Location: [facility];  Service: Endoscopy;  Laterality: N/A;  . ESOPHAGOGASTRODUODENOSCOPY  12/03/2011   Procedure: ESOPHAGOGASTRODUODENOSCOPY (EGD);  Surgeon: Beryle Beams, MD;  Location: [facility];  Service: Endoscopy;  Laterality: N/A;  . FLEXIBLE SIGMOIDOSCOPY N/A 03/10/2017   Procedure: FLEXIBLE SIGMOIDOSCOPY;  Surgeon: Carol Ada, MD;  Location: [facility];  Service: Endoscopy;  Laterality: N/A;  . INCISION AND DRAINAGE PERIRECTAL ABSCESS N/A 03/01/2017   Procedure: IRRIGATION AND DEBRIDEMENT PERIRECTAL ABSCESS;  Surgeon: Alphonsa Overall, MD;  Location: [facility];  Service: General;  Laterality:  N/A;  . IRRIGATION AND DEBRIDEMENT BUTTOCKS N/A 03/23/2017   Procedure: IRRIGATION AND DEBRIDEMENT BUTTOCKS, SETON PLACEMENT;  Surgeon: [name], MD;  Location: [facility];  Service: General;  Laterality: N/A;  . LAPAROSCOPY  11/23/2011   Procedure: LAPAROSCOPY DIAGNOSTIC;  Surgeon: Edward Jolly, MD;  Location: [facility];  Service: General;  Laterality: N/A;  . SIGMOIDOSCOPY    . UPPER GASTROINTESTINAL ENDOSCOPY    . WISDOM TOOTH EXTRACTION       OB History    Gravida  1   Para  1   Term      Preterm  1   AB      Living  1     SAB      IAB      Ectopic      Multiple  0   Live Births  1           Family History  Problem Relation Age of Onset  . Diabetes Mother   . Hypertension Father   . Colon cancer Paternal Grandmother        pt thinks PGM was dx in her 16's  . Diabetes Paternal Grandmother   . Diabetes Maternal Grandmother   . Diabetes Maternal Grandfather   . Diabetes Paternal Grandfather   . Diabetes Other   . Breast cancer Paternal Aunt   . Esophageal cancer Neg Hx   . Liver cancer Neg Hx   . Pancreatic cancer Neg Hx   . Stomach cancer Neg Hx   . Rectal cancer Neg Hx     Social History   Tobacco Use  . Smoking status: Never Smoker  . Smokeless tobacco: Never Used  Vaping Use  . Vaping Use: Never used  Substance Use Topics  . Alcohol use: No    Alcohol/week: 0.0 standard drinks  . Drug use: Yes    Types: Marijuana    Home Medications Prior to Admission medications   Medication Sig Start Date End Date Taking? Authorizing Provider  aluminum-magnesium hydroxide-simethicone (MAALOX) 662-947-65 MG/5ML SUSP Take 30 mLs by mouth 3 (three) times daily as needed (indigestion).    [provider]  busPIRone (BUSPAR) 5 MG tablet Take 1 tablet (5 mg total) by mouth 3 (three) times daily. 02/03/20   [name], MD  carvedilol (COREG) 6.25 MG tablet Take 6.25 mg by mouth 2 (two) times daily with a meal.    [provider]   Continuous Blood Gluc Sensor (DEXCOM G6 SENSOR) MISC USE AS DIRECTED WITH CONTINUOUS BLOOD GLUCOSE MONITOR. REPLACE EVERY 10 DAYS 02/27/20   Renato Shin, MD  Continuous Blood Gluc Transmit (DEXCOM G6 TRANSMITTER) MISC USE AS DIRECTED TO  MONITOR  GLUCOSE  LEVELS 01/19/20   Renato Shin, MD  cyclobenzaprine (FLEXERIL) 10 MG tablet TAKE 1 TABLET(10 MG) BY MOUTH THREE TIMES DAILY AS NEEDED FOR MUSCLE SPASMS 04/02/20   [name], MD  dicyclomine (BENTYL) 20 MG tablet Take 1 tablet (20 mg total) by mouth every 8 (eight) hours as needed for spasms.  11/25/19   Petrucelli, Samantha R, PA-C  hydrALAZINE (APRESOLINE) 50 MG tablet TAKE 1 TABLET(50 MG) BY MOUTH EVERY 6 HOURS Patient taking differently: Take 50 mg by mouth every 6 (six) hours. 01/30/20   [name], MD  Insulin NPH, Human,, Isophane, (HUMULIN N KWIKPEN) 100 UNIT/ML Kiwkpen Inject 20 Units into the skin every morning. 03/02/20   Renato Shin, MD  isosorbide mononitrate (IMDUR) 30 MG 24 hr tablet Take 30 mg by mouth daily. 01/03/20   [provider]  loperamide (IMODIUM) 2 MG capsule Take 6 mg by mouth daily as needed for diarrhea or loose stools.     [provider]  LORazepam (ATIVAN) 0.5 MG tablet TAKE 1 TABLET(0.5 MG) BY MOUTH TWICE DAILY AS NEEDED FOR ANXIETY Patient taking differently: Take 0.5 mg by mouth 2 (two) times daily as needed for anxiety. 03/07/20   [name], MD  metoCLOPramide (REGLAN) 5 MG tablet Take 1 tablet (5 mg total) by mouth in the morning and at bedtime. Please keep your January appt for further refills. Thank you 03/28/20   Armbruster, Carlota Raspberry, MD  mirtazapine (REMERON) 30 MG tablet TAKE 1 TABLET BY MOUTH AT  BEDTIME Patient taking differently: Take 30 mg by mouth at bedtime. 01/17/20   [name], MD  ondansetron (ZOFRAN ODT) 4 MG disintegrating tablet Take 1 tablet (4 mg total) by mouth every 8 (eight) hours as needed for nausea or vomiting. 04/06/20   Lucretia Kern, DO   pantoprazole (PROTONIX) 40 MG tablet Take 1 tablet (40 mg total) by mouth 2 (two) times daily. 12/06/19   Armbruster, Carlota Raspberry, MD  promethazine (PHENERGAN) 25 MG suppository Place 1 suppository (25 mg total) rectally every 6 (six) hours as needed for nausea or vomiting. 03/20/20   Mesner, Corene Cornea, MD  promethazine (PHENERGAN) 25 MG tablet Take 1 tablet (25 mg total) by mouth every 6 (six) hours as needed for nausea or vomiting. 03/20/20   Mesner, Corene Cornea, MD  scopolamine (TRANSDERM-SCOP) 1 MG/3DAYS Place 1 patch onto the skin every 3 (three) days.    [provider]  sucralfate (CARAFATE) 1 GM/10ML suspension Take 10 mLs (1 g total) by mouth every 6 (six) hours as needed. Patient taking differently: Take 1 g by mouth every 6 (six) hours as needed (stomach). 11/24/19   Armbruster, Carlota Raspberry, MD  potassium chloride SA (KLOR-CON) 20 MEQ tablet Take 1 tablet (20 mEq total) by mouth daily for 5 doses. 07/04/19 07/30/19  Janeece Fitting, PA-C    Allergies    Other and Lactose intolerance (gi)  Review of Systems   Review of Systems  Constitutional: Positive for chills and fever.  HENT: Negative for congestion, rhinorrhea and sore throat.   Respiratory: Negative for cough and shortness of breath.   Cardiovascular: Negative for chest pain.  Gastrointestinal: Negative for abdominal pain, nausea and vomiting.  Musculoskeletal: Positive for arthralgias and myalgias.  Skin: Positive for color change and wound.  Neurological: Negative for dizziness, syncope and light-headedness.  All other systems reviewed and are negative.   Physical Exam Updated Vital Signs BP (!) 148/93 (BP Location: Right Arm)   Pulse 70   Temp 99.6 F (37.6 C) (Oral)   Resp 19   Ht 5' 7"  (1.702 m)   Wt 60 kg   LMP 03/24/2020 (Approximate)   SpO2 97%   BMI 20.71 kg/m   Physical Exam Vitals and nursing note reviewed.  Constitutional:      General: She is not in  acute distress.    Appearance: Normal appearance. She is  well-developed, normal weight and well-nourished. She is not ill-appearing or diaphoretic.  HENT:     Head: Normocephalic and atraumatic.     Nose: No congestion or rhinorrhea.     Mouth/Throat:     Mouth: Mucous membranes are moist.     Pharynx: Oropharynx is clear.  Eyes:     General:        Right eye: No discharge.        Left eye: No discharge.  Cardiovascular:     Rate and Rhythm: Normal rate and regular rhythm.     Heart sounds: Normal heart sounds. No murmur heard. No gallop.   Pulmonary:     Effort: Pulmonary effort is normal. No respiratory distress.     Breath sounds: Normal breath sounds.     Comments: Respirations equal and unlabored, patient able to speak in full sentences, lungs clear to auscultation bilaterally  Abdominal:     General: Bowel sounds are normal. There is no distension.     Palpations: Abdomen is soft. There is no mass.     Tenderness: There is no abdominal tenderness. There is no guarding.  Musculoskeletal:        General: Tenderness present.     Comments: As pictured below there is a wound over the ball of the right foot just proximal to the great toe, there is apparent skin breakdown and purulent drainage with the surrounding erythema and swelling, there is a visible pocket of pus and when pressure is applied to this area there is drainage and bubbling from the wound base, palpable crepitus surrounding the wound. Patient with some swelling extending up towards the ankle.  She has a chronic appearing ulceration to the anterior ankle that is not tender and without erythema, swelling or drainage. Distal pulses 2+, normal sensation throughout the foot.  Skin:    General: Skin is warm and dry.  Neurological:     Mental Status: She is alert and oriented to person, place, and time.     Coordination: Coordination normal.  Psychiatric:        Mood and Affect: Mood and affect normal.        Behavior: Behavior normal.            ED Results / Procedures  / Treatments   Labs (all labs ordered are listed, but only abnormal results are displayed) Labs Reviewed  COMPREHENSIVE METABOLIC PANEL - Abnormal; Notable for the following components:      Result Value   Sodium 131 (*)    BUN 23 (*)    Creatinine, Ser 1.28 (*)    Calcium 8.4 (*)    Albumin 2.4 (*)    GFR, Estimated 58 (*)    All other components within normal limits  CBC WITH DIFFERENTIAL/PLATELET - Abnormal; Notable for the following components:   RBC 2.57 (*)    Hemoglobin 7.1 (*)    HCT 22.5 (*)    All other components within normal limits  CULTURE, BLOOD (ROUTINE X 2)  CULTURE, BLOOD (ROUTINE X 2)  LACTIC ACID, PLASMA  LACTIC ACID, PLASMA  CBG MONITORING, ED    EKG None  Radiology DG Foot Complete Right  Result Date: 04/09/2020 CLINICAL DATA:  Wound to right great toe EXAM: RIGHT FOOT COMPLETE - 3+ VIEW COMPARISON:  None. FINDINGS: Soft tissue swelling with underlying soft tissue gas along the medial aspect of the 1st metatarsal head. No underlying cortical irregularity.  No fracture or dislocation is seen. The joint spaces are preserved. Postsurgical changes involving the distal tibia and fibula. IMPRESSION: Soft tissue swelling with underlying soft tissue gas along the medial aspect of the 1st metatarsal head. No radiographic findings to suggests acute osteomyelitis. Electronically Signed   By: Julian Hy M.D.   On: 04/09/2020 15:37    Procedures Procedures (including critical care time)  Medications Ordered in ED Medications  vancomycin (VANCOCIN) IVPB 1000 mg/200 mL premix (1,000 mg Intravenous New Bag/Given 04/09/20 1624)  0.9 %  sodium chloride infusion (100 mLs Intravenous New Bag/Given 04/09/20 1545)  piperacillin-tazobactam (ZOSYN) IVPB 3.375 g (0 g Intravenous Stopped 04/09/20 1622)    ED Course  I have reviewed the triage vital signs and the nursing notes.  Pertinent labs & imaging results that were available during my care of the patient were  reviewed by me and considered in my medical decision making (see chart for details).    MDM Rules/Calculators/A&P                         30 year old female presents with wound to the right foot that started a week ago from what she thought was a callus it has become increasingly painful and swollen with purulent drainage, there is a palpable pocket of purulence above this with crepitus. Patient reports pain and swelling are now extending up the leg. She is a type I diabetic. She is also currently COVID-positive, has had 4 days of mild symptoms, is vaccinated and aches experiencing primarily fever and myalgias. No cough or respiratory symptoms fortunately at this time. Severe wound as pictured above. Will check basic labs, lactic acid, blood cultures and x-ray to assess for associated osteomyelitis or soft tissue gas.   I have independently ordered, reviewed and interpreted all labs and imaging: CBC: No leukocytosis, hemoglobin of 7.1 CMP: Mild hyponatremia, glucose normal today fortunately, creatinine at 1.28, significantly improved from prior. Lactic acid: Not elevated at 0.5  blood cultures: Pending  X-ray of the foot shows soft tissue swelling with underlying soft tissue gas along the medial aspect of the 1st metatarsal head, no clear radiographic findings of osteomyelitis on x-ray.  Patient with diabetic foot wound, started on IV Vanco and Zosyn. Will consult with orthopedics and patient will require admission.  Case discussed with Dr. Alvan Dame with orthopedics who recommends admitting patient via hospitalist to [facility] and having hospitalist team consult Dr. [name] in the morning for further evaluation. In the meantime agrees with IV antibiotics and recommends MRI of the foot for further evaluation given soft tissue gas and concern for underlying osteomyelitis.  Case discussed with Dr. Bonner Puna with [facility] hospitalist who accepts patient for transfer and admission, requests CRP and sed rate and  chest x-ray given current COVID status. Will have admitting provider consult Dr. [name] in the morning.   Final Clinical Impression(s) / ED Diagnoses Final diagnoses:  Wound infection  COVID-19 virus infection    Rx / DC Orders ED Discharge Orders    None       Janet Berlin 04/10/20 0026    Margette Fast, MD 04/12/20 1025

## 2020-04-10 ENCOUNTER — Encounter (HOSPITAL_COMMUNITY): Payer: Self-pay | Admitting: Family Medicine

## 2020-04-10 ENCOUNTER — Observation Stay (HOSPITAL_COMMUNITY): Payer: 59

## 2020-04-10 DIAGNOSIS — N183 Chronic kidney disease, stage 3 unspecified: Secondary | ICD-10-CM

## 2020-04-10 DIAGNOSIS — Z79899 Other long term (current) drug therapy: Secondary | ICD-10-CM | POA: Diagnosis not present

## 2020-04-10 DIAGNOSIS — I160 Hypertensive urgency: Secondary | ICD-10-CM | POA: Diagnosis present

## 2020-04-10 DIAGNOSIS — E43 Unspecified severe protein-calorie malnutrition: Secondary | ICD-10-CM

## 2020-04-10 DIAGNOSIS — U071 COVID-19: Secondary | ICD-10-CM

## 2020-04-10 DIAGNOSIS — T148XXA Other injury of unspecified body region, initial encounter: Secondary | ICD-10-CM

## 2020-04-10 DIAGNOSIS — E1069 Type 1 diabetes mellitus with other specified complication: Secondary | ICD-10-CM | POA: Diagnosis present

## 2020-04-10 DIAGNOSIS — E10621 Type 1 diabetes mellitus with foot ulcer: Secondary | ICD-10-CM | POA: Diagnosis present

## 2020-04-10 DIAGNOSIS — N1831 Chronic kidney disease, stage 3a: Secondary | ICD-10-CM | POA: Diagnosis present

## 2020-04-10 DIAGNOSIS — L03119 Cellulitis of unspecified part of limb: Secondary | ICD-10-CM

## 2020-04-10 DIAGNOSIS — M869 Osteomyelitis, unspecified: Secondary | ICD-10-CM | POA: Diagnosis not present

## 2020-04-10 DIAGNOSIS — D649 Anemia, unspecified: Secondary | ICD-10-CM

## 2020-04-10 DIAGNOSIS — L03115 Cellulitis of right lower limb: Secondary | ICD-10-CM | POA: Diagnosis present

## 2020-04-10 DIAGNOSIS — M868X7 Other osteomyelitis, ankle and foot: Secondary | ICD-10-CM | POA: Diagnosis present

## 2020-04-10 DIAGNOSIS — L97524 Non-pressure chronic ulcer of other part of left foot with necrosis of bone: Secondary | ICD-10-CM | POA: Diagnosis not present

## 2020-04-10 DIAGNOSIS — L089 Local infection of the skin and subcutaneous tissue, unspecified: Secondary | ICD-10-CM

## 2020-04-10 DIAGNOSIS — E1022 Type 1 diabetes mellitus with diabetic chronic kidney disease: Secondary | ICD-10-CM

## 2020-04-10 DIAGNOSIS — E871 Hypo-osmolality and hyponatremia: Secondary | ICD-10-CM

## 2020-04-10 DIAGNOSIS — J80 Acute respiratory distress syndrome: Secondary | ICD-10-CM | POA: Diagnosis not present

## 2020-04-10 DIAGNOSIS — M797 Fibromyalgia: Secondary | ICD-10-CM | POA: Diagnosis present

## 2020-04-10 DIAGNOSIS — L97519 Non-pressure chronic ulcer of other part of right foot with unspecified severity: Secondary | ICD-10-CM | POA: Diagnosis present

## 2020-04-10 DIAGNOSIS — Z91011 Allergy to milk products: Secondary | ICD-10-CM | POA: Diagnosis not present

## 2020-04-10 DIAGNOSIS — L02619 Cutaneous abscess of unspecified foot: Secondary | ICD-10-CM | POA: Diagnosis present

## 2020-04-10 DIAGNOSIS — E1043 Type 1 diabetes mellitus with diabetic autonomic (poly)neuropathy: Secondary | ICD-10-CM | POA: Diagnosis present

## 2020-04-10 DIAGNOSIS — K219 Gastro-esophageal reflux disease without esophagitis: Secondary | ICD-10-CM | POA: Diagnosis present

## 2020-04-10 DIAGNOSIS — F419 Anxiety disorder, unspecified: Secondary | ICD-10-CM | POA: Diagnosis present

## 2020-04-10 DIAGNOSIS — F32A Depression, unspecified: Secondary | ICD-10-CM | POA: Diagnosis present

## 2020-04-10 DIAGNOSIS — L97429 Non-pressure chronic ulcer of left heel and midfoot with unspecified severity: Secondary | ICD-10-CM

## 2020-04-10 DIAGNOSIS — Z833 Family history of diabetes mellitus: Secondary | ICD-10-CM | POA: Diagnosis not present

## 2020-04-10 DIAGNOSIS — K3184 Gastroparesis: Secondary | ICD-10-CM | POA: Diagnosis present

## 2020-04-10 DIAGNOSIS — L02611 Cutaneous abscess of right foot: Secondary | ICD-10-CM | POA: Diagnosis present

## 2020-04-10 DIAGNOSIS — G4700 Insomnia, unspecified: Secondary | ICD-10-CM | POA: Diagnosis present

## 2020-04-10 DIAGNOSIS — Z91018 Allergy to other foods: Secondary | ICD-10-CM | POA: Diagnosis not present

## 2020-04-10 DIAGNOSIS — L0390 Cellulitis, unspecified: Secondary | ICD-10-CM | POA: Diagnosis not present

## 2020-04-10 DIAGNOSIS — J9601 Acute respiratory failure with hypoxia: Secondary | ICD-10-CM | POA: Diagnosis not present

## 2020-04-10 DIAGNOSIS — M86271 Subacute osteomyelitis, right ankle and foot: Secondary | ICD-10-CM

## 2020-04-10 DIAGNOSIS — Z794 Long term (current) use of insulin: Secondary | ICD-10-CM | POA: Diagnosis not present

## 2020-04-10 LAB — GLUCOSE, CAPILLARY
Glucose-Capillary: 254 mg/dL — ABNORMAL HIGH (ref 70–99)
Glucose-Capillary: 228 mg/dL — ABNORMAL HIGH (ref 70–99)
Glucose-Capillary: 119 mg/dL — ABNORMAL HIGH (ref 70–99)
Glucose-Capillary: 60 mg/dL — ABNORMAL LOW (ref 70–99)
Glucose-Capillary: 127 mg/dL — ABNORMAL HIGH (ref 70–99)
Glucose-Capillary: 49 mg/dL — ABNORMAL LOW (ref 70–99)
Glucose-Capillary: 50 mg/dL — ABNORMAL LOW (ref 70–99)
Glucose-Capillary: 125 mg/dL — ABNORMAL HIGH (ref 70–99)
Glucose-Capillary: 76 mg/dL (ref 70–99)

## 2020-04-10 LAB — CBC
HCT: 19.8 % — ABNORMAL LOW (ref 36.0–46.0)
Hemoglobin: 6.3 g/dL — CL (ref 12.0–15.0)
MCH: 27.4 pg (ref 26.0–34.0)
MCHC: 31.8 g/dL (ref 30.0–36.0)
MCV: 86.1 fL (ref 80.0–100.0)
Platelets: 242 10*3/uL (ref 150–400)
RBC: 2.3 MIL/uL — ABNORMAL LOW (ref 3.87–5.11)
RDW: 12.9 % (ref 11.5–15.5)
WBC: 8.1 10*3/uL (ref 4.0–10.5)
nRBC: 0 % (ref 0.0–0.2)

## 2020-04-10 LAB — IRON AND TIBC
Iron: 8 ug/dL — ABNORMAL LOW (ref 28–170)
Saturation Ratios: 4 % — ABNORMAL LOW (ref 10.4–31.8)
TIBC: 195 ug/dL — ABNORMAL LOW (ref 250–450)
UIBC: 187 ug/dL

## 2020-04-10 LAB — RETICULOCYTES
Immature Retic Fract: 9.4 % (ref 2.3–15.9)
RBC.: 2.32 MIL/uL — ABNORMAL LOW (ref 3.87–5.11)
Retic Count, Absolute: 20.9 10*3/uL (ref 19.0–186.0)
Retic Ct Pct: 0.9 % (ref 0.4–3.1)

## 2020-04-10 LAB — CREATININE, SERUM
Creatinine, Ser: 1.25 mg/dL — ABNORMAL HIGH (ref 0.44–1.00)
GFR, Estimated: 59 mL/min — ABNORMAL LOW (ref >60)

## 2020-04-10 LAB — PREPARE RBC (CROSSMATCH)

## 2020-04-10 LAB — HEMOGLOBIN A1C
Hgb A1c MFr Bld: 8.6 % — ABNORMAL HIGH (ref 4.8–5.6)
Mean Plasma Glucose: 200.12 mg/dL

## 2020-04-10 LAB — FOLATE: Folate: 34.4 ng/mL (ref >5.9)

## 2020-04-10 LAB — HEMOGLOBIN AND HEMATOCRIT, BLOOD
HCT: 27.5 % — ABNORMAL LOW (ref 36.0–46.0)
Hemoglobin: 8.7 g/dL — ABNORMAL LOW (ref 12.0–15.0)

## 2020-04-10 LAB — FERRITIN: Ferritin: 128 ng/mL (ref 11–307)

## 2020-04-10 LAB — VITAMIN B12: Vitamin B-12: 366 pg/mL (ref 180–914)

## 2020-04-10 IMAGING — MR MR FOOT*R* W/O CM
4 of 5 series · 18 of 40 positions shown · non-contrast
Comparison: X-ray [DATE]

CLINICAL DATA: Diabetic foot ulcer adjacent to the first MTP joint.
A

EXAM:
MRI OF THE RIGHT FOREFOOT WITHOUT CONTRAST
TECHNIQUE: Multiplanar, multisequence MR imaging of the right forefoot was
performed. No intravenous contrast was administered.

[Series 3: T1 · coronal · 3.0mm · 0.29mm/px · 3 of 46 slices shown (1 of 2)]
[im 5/46]
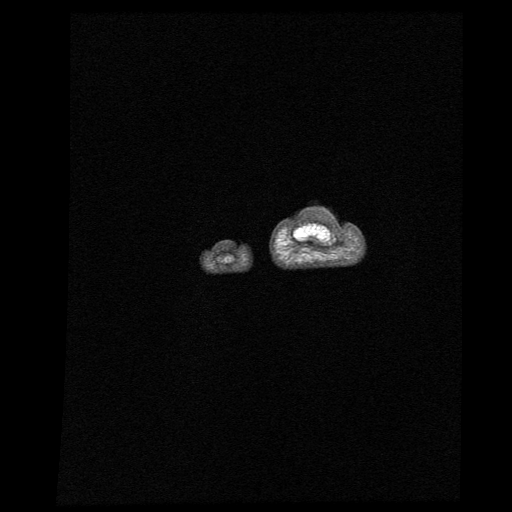
[im 23/46]
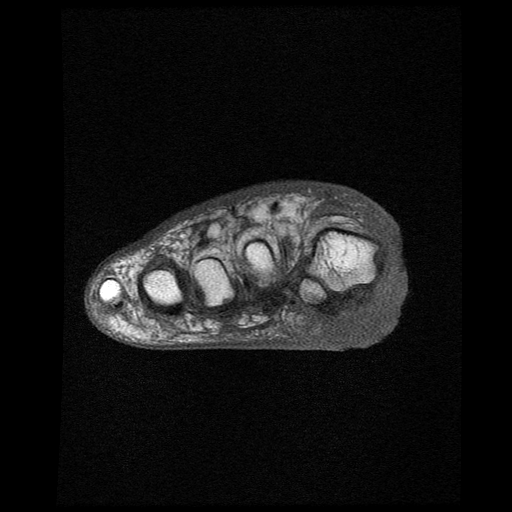
[im 41/46]
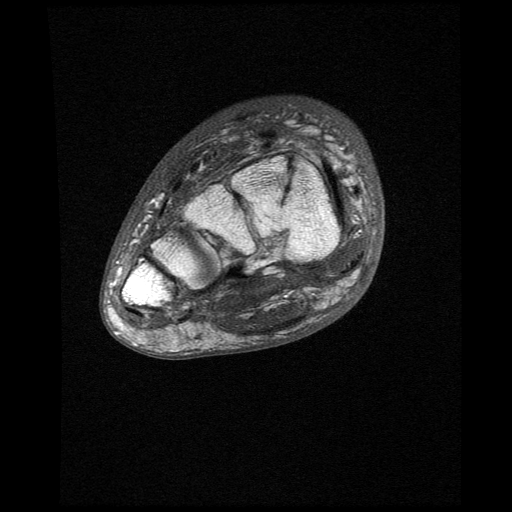

[Series 4: T2 fat-sat · coronal · 3.0mm · 0.29mm/px · 9 of 46 slices shown (1 of 2)]
[im 1/46]
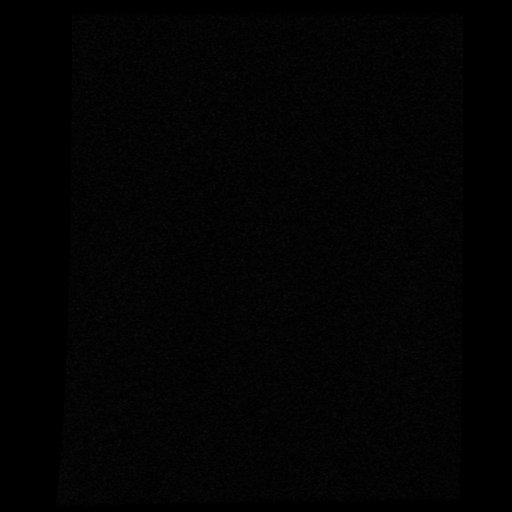
[im 9/46]
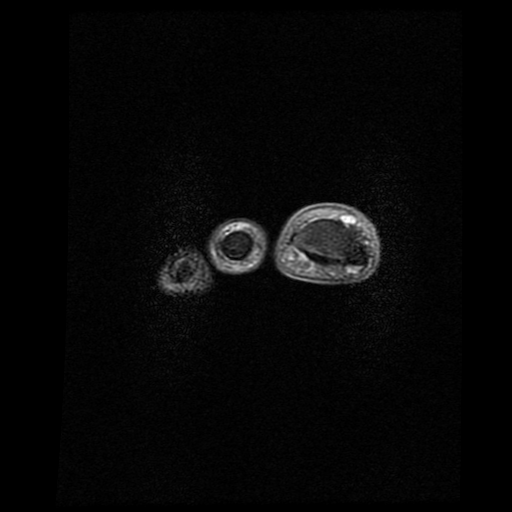
[im 13/46]
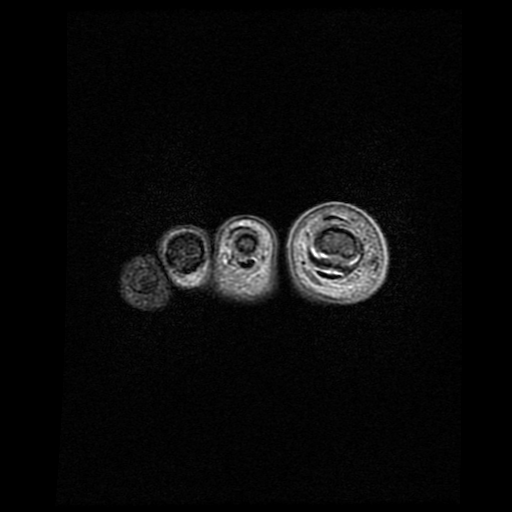
[im 21/46]
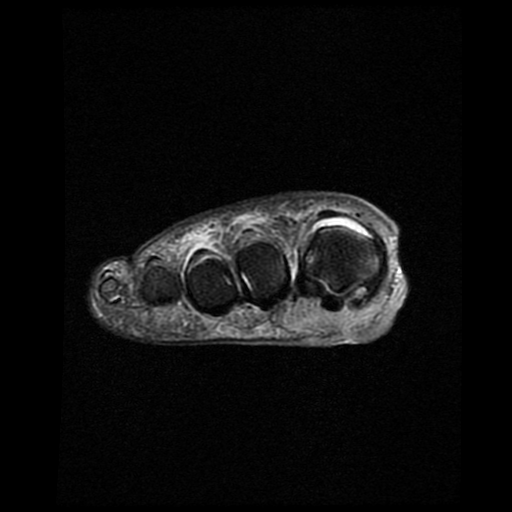
[im 25/46]
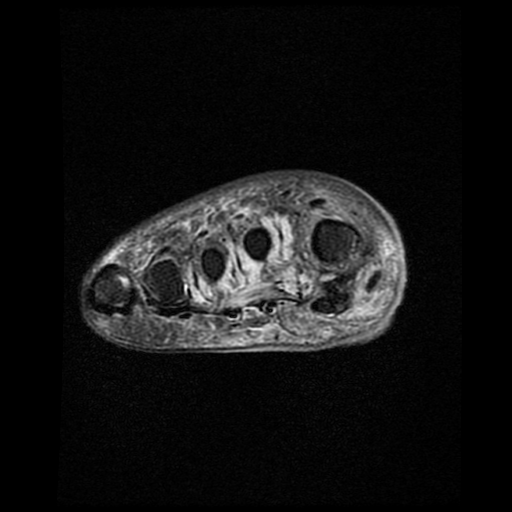
[im 33/46]
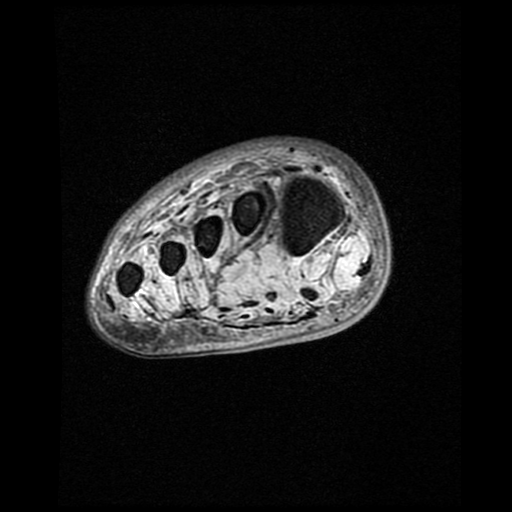
[im 37/46]
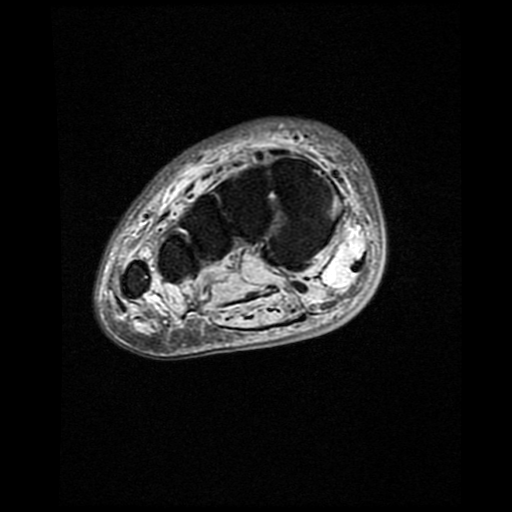
[im 41/46]
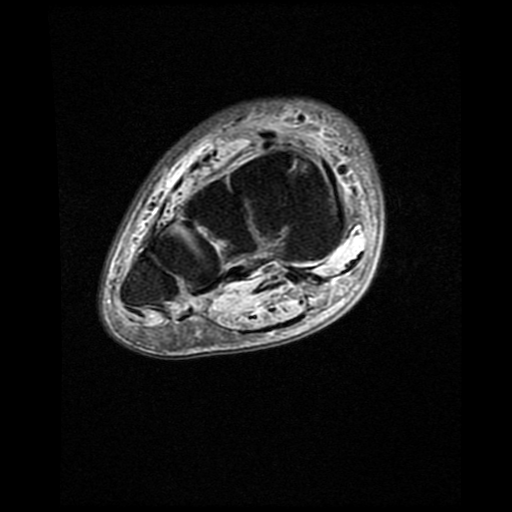
[im 46/46]
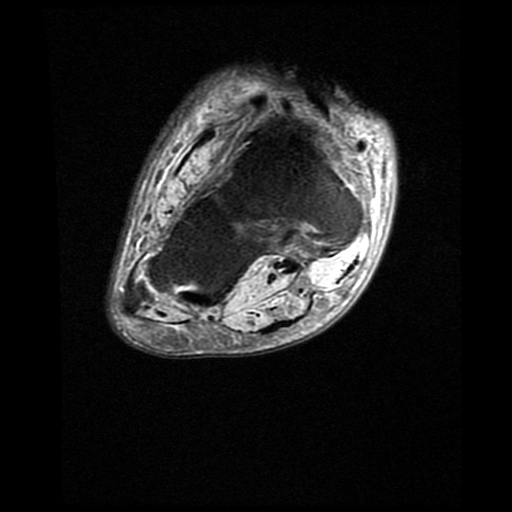

[Series 5: T1 · oblique · 3.0mm · 0.35mm/px · 3 of 20 slices shown (2 of 2)]
[im 1/20]
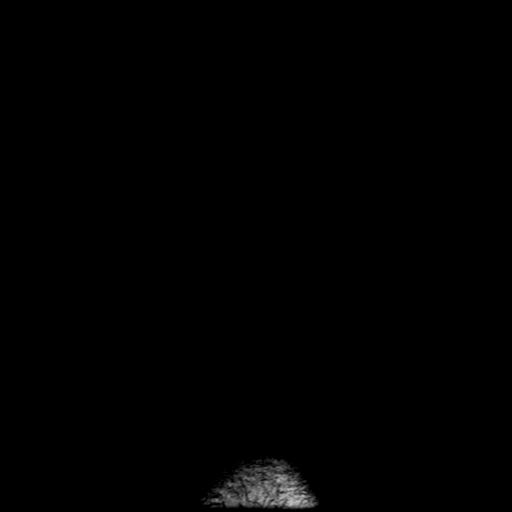
[im 10/20]
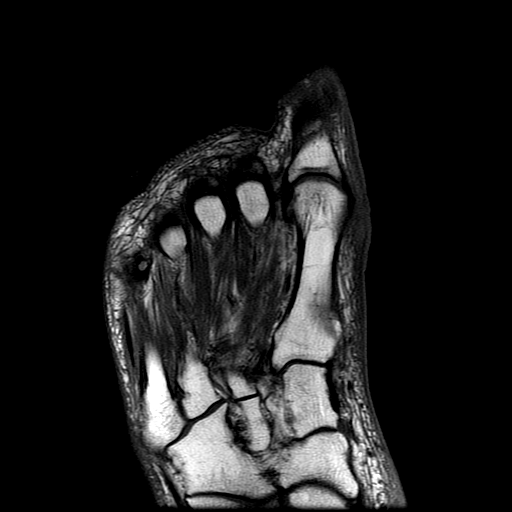
[im 20/20]
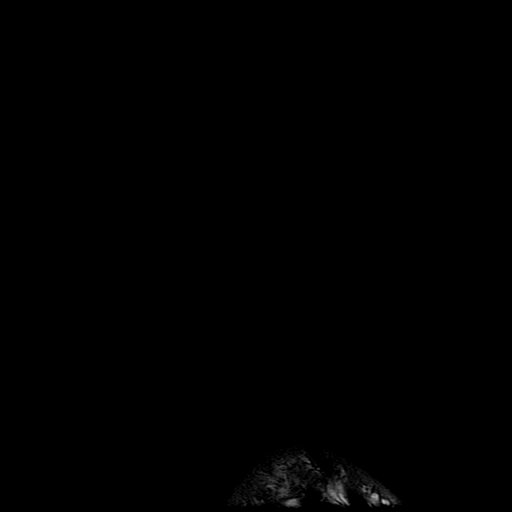

[Series 6: T2 fat-sat · axial · 3.0mm · 0.35mm/px · z∈[-75,-14]mm · 3 of 20 slices shown (2 of 2)]
[im 1/20]
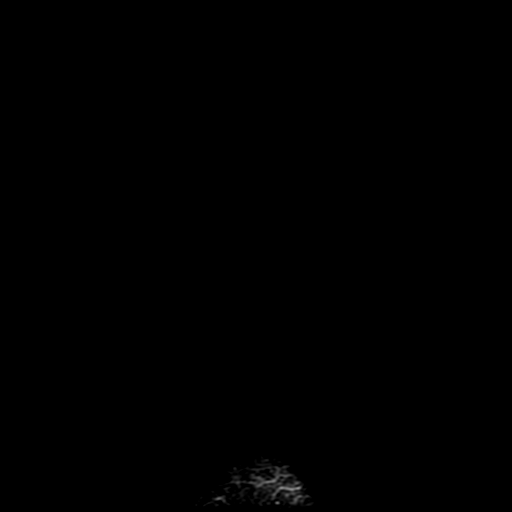
[im 10/20]
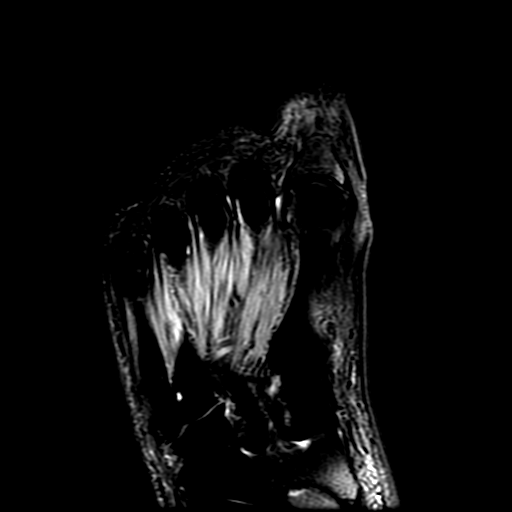
[im 20/20]
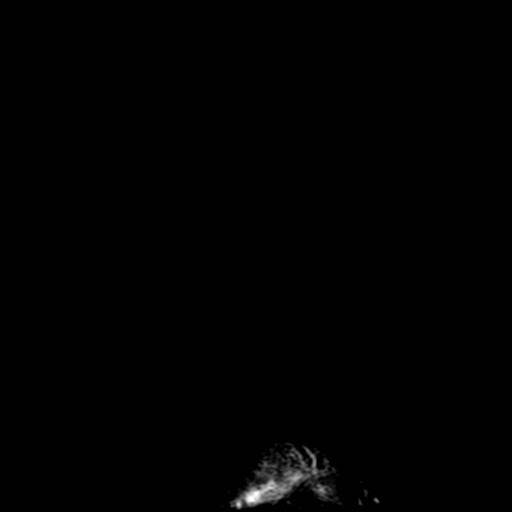

[18 of 40 positions shown; findings below may reference images not displayed]

FINDINGS: Bones/Joint/Cartilage

No acute fracture. No malalignment. There is subtle marrow edema
within the medial hallux sesamoid (series 4, image 22) with patchy
low T1 marrow signal (series 3, image 22). Elsewhere, no additional
sites of bone marrow edema within the right forefoot. No bony
erosion or periostitis. Small first MTP joint effusion.

Ligaments

Intact Lisfranc ligament. Collateral ligaments of the forefoot
intact.

Muscles and Tendons

Mild atrophy of the intrinsic foot musculature suggesting chronic
denervation changes. Intact flexor and extensor tendons. Trace
tenosynovial fluid within the flexor hallucis longus tendon sheath
just proximal to the first MTP joint.

Soft tissues

Soft tissue ulceration overlying the lateral aspect of the forefoot
at the level of the first metatarsal head. There is mild soft tissue
edema surrounding. No organized fluid collection. There is
nonspecific subcutaneous edema throughout the dorsum of the
forefoot.
IMPRESSION: 1. Soft tissue ulceration overlying the lateral aspect of the
forefoot at the level of the first metatarsal head with adjacent
cellulitis. No organized fluid collection/abscess.
2. Subtle marrow edema within the medial hallux sesamoid with patchy
low T1 marrow signal. Findings are favored to represent acute
osteomyelitis given the proximity of adjacent soft tissue infection.
Sequela of chronic sesamoiditis could have a similar appearance,
although is felt to be less likely.
3. Small first MTP joint effusion, which may be reactive or reflect
septic arthritis.
4. Trace tenosynovial fluid within the flexor hallucis longus tendon
sheath just proximal to the first MTP joint, suggesting a mild
tenosynovitis which could be reactive or infectious.

## 2020-04-10 MED ORDER — DEXTROSE 50 % IV SOLN
INTRAVENOUS | Status: AC
  Administered 2020-04-10: 12.5 g
  Filled 2020-04-10: qty 50

## 2020-04-10 MED ORDER — OXYCODONE-ACETAMINOPHEN 5-325 MG PO TABS
1.0000 | ORAL_TABLET | Freq: Once | ORAL | Status: AC
  Administered 2020-04-10: 1 via ORAL
  Filled 2020-04-10: qty 1

## 2020-04-10 MED ORDER — OXYCODONE HCL 5 MG PO TABS
5.0000 mg | ORAL_TABLET | Freq: Four times a day (QID) | ORAL | Status: DC | PRN
Start: 2020-04-10 — End: 2020-04-16
  Administered 2020-04-10 – 2020-04-15 (×11): 5 mg via ORAL
  Filled 2020-04-10 (×11): qty 1

## 2020-04-10 MED ORDER — METOCLOPRAMIDE HCL 5 MG PO TABS
5.0000 mg | ORAL_TABLET | Freq: Three times a day (TID) | ORAL | Status: DC | PRN
  Administered 2020-04-11: 5 mg via ORAL
  Filled 2020-04-10: qty 1

## 2020-04-10 MED ORDER — SODIUM CHLORIDE 0.9 % IV SOLN
2.0000 g | Freq: Once | INTRAVENOUS | Status: AC
  Administered 2020-04-10: 2 g via INTRAVENOUS
  Filled 2020-04-10: qty 2

## 2020-04-10 MED ORDER — HYDRALAZINE HCL 50 MG PO TABS
50.0000 mg | ORAL_TABLET | Freq: Four times a day (QID) | ORAL | Status: DC
  Administered 2020-04-10 – 2020-04-16 (×23): 50 mg via ORAL
  Filled 2020-04-10 (×24): qty 1

## 2020-04-10 MED ORDER — VANCOMYCIN HCL IN DEXTROSE 1-5 GM/200ML-% IV SOLN
1000.0000 mg | Freq: Once | INTRAVENOUS | Status: DC
  Filled 2020-04-10: qty 200

## 2020-04-10 MED ORDER — SODIUM CHLORIDE 0.9 % IV SOLN
2.0000 g | Freq: Two times a day (BID) | INTRAVENOUS | Status: DC
  Administered 2020-04-10 – 2020-04-11 (×2): 2 g via INTRAVENOUS
  Filled 2020-04-10 (×2): qty 2

## 2020-04-10 MED ORDER — INSULIN ASPART 100 UNIT/ML ~~LOC~~ SOLN
0.0000 [IU] | Freq: Three times a day (TID) | SUBCUTANEOUS | Status: DC
  Administered 2020-04-11: 1 [IU] via SUBCUTANEOUS
  Administered 2020-04-12 (×3): 2 [IU] via SUBCUTANEOUS
  Administered 2020-04-13 – 2020-04-14 (×3): 1 [IU] via SUBCUTANEOUS
  Administered 2020-04-15: 3 [IU] via SUBCUTANEOUS
  Administered 2020-04-15 – 2020-04-16 (×2): 1 [IU] via SUBCUTANEOUS

## 2020-04-10 MED ORDER — SUCRALFATE 1 GM/10ML PO SUSP
1.0000 g | Freq: Four times a day (QID) | ORAL | Status: DC | PRN
  Filled 2020-04-10: qty 10

## 2020-04-10 MED ORDER — CARVEDILOL 6.25 MG PO TABS
6.2500 mg | ORAL_TABLET | Freq: Two times a day (BID) | ORAL | Status: DC
  Administered 2020-04-10 – 2020-04-14 (×6): 6.25 mg via ORAL
  Filled 2020-04-10 (×7): qty 1

## 2020-04-10 MED ORDER — INSULIN ASPART 100 UNIT/ML ~~LOC~~ SOLN
0.0000 [IU] | Freq: Three times a day (TID) | SUBCUTANEOUS | Status: DC
  Administered 2020-04-10: 3 [IU] via SUBCUTANEOUS

## 2020-04-10 MED ORDER — HEPARIN SODIUM (PORCINE) 5000 UNIT/ML IJ SOLN
5000.0000 [IU] | Freq: Three times a day (TID) | INTRAMUSCULAR | Status: DC
  Administered 2020-04-10 – 2020-04-16 (×18): 5000 [IU] via SUBCUTANEOUS
  Filled 2020-04-10 (×19): qty 1

## 2020-04-10 MED ORDER — SODIUM CHLORIDE 0.9% IV SOLUTION: Freq: Once | INTRAVENOUS | Status: DC

## 2020-04-10 MED ORDER — SCOPOLAMINE 1 MG/3DAYS TD PT72
1.0000 | MEDICATED_PATCH | TRANSDERMAL | Status: DC
  Administered 2020-04-10 – 2020-04-16 (×3): 1.5 mg via TRANSDERMAL
  Filled 2020-04-10 (×3): qty 1

## 2020-04-10 MED ORDER — CYCLOBENZAPRINE HCL 5 MG PO TABS
5.0000 mg | ORAL_TABLET | Freq: Three times a day (TID) | ORAL | Status: DC | PRN
  Administered 2020-04-10 – 2020-04-13 (×5): 5 mg via ORAL
  Filled 2020-04-10 (×5): qty 1

## 2020-04-10 MED ORDER — LORAZEPAM 0.5 MG PO TABS
0.5000 mg | ORAL_TABLET | Freq: Two times a day (BID) | ORAL | Status: DC | PRN
  Administered 2020-04-10: 0.5 mg via ORAL
  Filled 2020-04-10: qty 1

## 2020-04-10 MED ORDER — DEXTROSE 50 % IV SOLN
INTRAVENOUS | Status: AC
  Administered 2020-04-10: 25 mL
  Filled 2020-04-10: qty 50

## 2020-04-10 MED ORDER — VANCOMYCIN HCL 1250 MG/250ML IV SOLN
1250.0000 mg | INTRAVENOUS | Status: DC
  Administered 2020-04-10: 1250 mg via INTRAVENOUS
  Filled 2020-04-10 (×2): qty 250

## 2020-04-10 MED ORDER — ISOSORBIDE MONONITRATE ER 30 MG PO TB24
30.0000 mg | ORAL_TABLET | Freq: Every day | ORAL | Status: DC
Start: 2020-04-10 — End: 2020-04-16
  Administered 2020-04-10 – 2020-04-16 (×5): 30 mg via ORAL
  Filled 2020-04-10 (×7): qty 1

## 2020-04-10 MED ORDER — INSULIN GLARGINE 100 UNIT/ML ~~LOC~~ SOLN: 10.0000 [IU] | Freq: Every day | SUBCUTANEOUS | Status: DC

## 2020-04-10 MED ORDER — PANTOPRAZOLE SODIUM 40 MG PO TBEC
40.0000 mg | DELAYED_RELEASE_TABLET | Freq: Two times a day (BID) | ORAL | Status: DC
  Administered 2020-04-10 – 2020-04-16 (×11): 40 mg via ORAL
  Filled 2020-04-10 (×12): qty 1

## 2020-04-10 MED ORDER — BUSPIRONE HCL 5 MG PO TABS
5.0000 mg | ORAL_TABLET | Freq: Three times a day (TID) | ORAL | Status: DC
  Administered 2020-04-10 – 2020-04-16 (×16): 5 mg via ORAL
  Filled 2020-04-10 (×17): qty 1

## 2020-04-10 MED ORDER — ASCORBIC ACID 500 MG PO TABS
500.0000 mg | ORAL_TABLET | Freq: Every day | ORAL | Status: DC
  Administered 2020-04-10 – 2020-04-16 (×5): 500 mg via ORAL
  Filled 2020-04-10 (×6): qty 1

## 2020-04-10 MED ORDER — SODIUM CHLORIDE 0.9 % IV SOLN: INTRAVENOUS | Status: AC

## 2020-04-10 MED ORDER — DICYCLOMINE HCL 20 MG PO TABS
20.0000 mg | ORAL_TABLET | Freq: Three times a day (TID) | ORAL | Status: DC | PRN
  Filled 2020-04-10: qty 1

## 2020-04-10 MED ORDER — HYDRALAZINE HCL 20 MG/ML IJ SOLN
10.0000 mg | INTRAMUSCULAR | Status: DC | PRN
  Administered 2020-04-11: 10 mg via INTRAVENOUS
  Filled 2020-04-10: qty 1

## 2020-04-10 MED ORDER — INSULIN NPH (HUMAN) (ISOPHANE) 100 UNIT/ML ~~LOC~~ SUSP
20.0000 [IU] | SUBCUTANEOUS | Status: DC
  Administered 2020-04-10: 20 [IU] via SUBCUTANEOUS
  Filled 2020-04-10: qty 10

## 2020-04-10 MED ORDER — ACETAMINOPHEN 650 MG RE SUPP: 650.0000 mg | Freq: Four times a day (QID) | RECTAL | Status: DC | PRN

## 2020-04-10 MED ORDER — SODIUM CHLORIDE 0.9 % IV SOLN
100.0000 mg | Freq: Every day | INTRAVENOUS | Status: AC
  Administered 2020-04-11 – 2020-04-12 (×2): 100 mg via INTRAVENOUS
  Filled 2020-04-10 (×2): qty 20

## 2020-04-10 MED ORDER — GLUCOSE 40 % PO GEL
ORAL | Status: AC
  Administered 2020-04-10: 37.5 g
  Filled 2020-04-10: qty 1

## 2020-04-10 MED ORDER — SODIUM CHLORIDE 0.9 % IV SOLN
200.0000 mg | Freq: Once | INTRAVENOUS | Status: AC
  Administered 2020-04-10: 200 mg via INTRAVENOUS
  Filled 2020-04-10: qty 40

## 2020-04-10 MED ORDER — DEXTROSE-NACL 5-0.9 % IV SOLN: INTRAVENOUS | Status: DC

## 2020-04-10 MED ORDER — ACETAMINOPHEN 325 MG PO TABS
650.0000 mg | ORAL_TABLET | Freq: Four times a day (QID) | ORAL | Status: DC | PRN
  Administered 2020-04-10 (×3): 650 mg via ORAL
  Filled 2020-04-10 (×3): qty 2

## 2020-04-10 MED ORDER — COLLAGENASE 250 UNIT/GM EX OINT
TOPICAL_OINTMENT | Freq: Every day | CUTANEOUS | Status: DC
  Filled 2020-04-10: qty 30

## 2020-04-10 MED ORDER — MIRTAZAPINE 15 MG PO TABS
30.0000 mg | ORAL_TABLET | Freq: Every day | ORAL | Status: DC
  Administered 2020-04-10 – 2020-04-15 (×6): 30 mg via ORAL
  Filled 2020-04-10 (×6): qty 2

## 2020-04-10 MED ORDER — ZINC SULFATE 220 (50 ZN) MG PO CAPS
220.0000 mg | ORAL_CAPSULE | Freq: Every day | ORAL | Status: DC
  Administered 2020-04-10 – 2020-04-16 (×5): 220 mg via ORAL
  Filled 2020-04-10 (×6): qty 1

## 2020-04-10 MED ORDER — MORPHINE SULFATE (PF) 2 MG/ML IV SOLN
1.0000 mg | INTRAVENOUS | Status: DC | PRN
  Administered 2020-04-11 – 2020-04-14 (×16): 1 mg via INTRAVENOUS
  Filled 2020-04-10 (×16): qty 1

## 2020-04-10 NOTE — Consult Note (Signed)
Reason for Consult:Left foot ulcer Referring Physician: E [place] Time called: 0920 Time at bedside: [id number]   [name] is an 30 y.o. female.  HPI: [name] was admitted yesterday with a right foot infection stemming from an ulceration that began about a week ago. She first noticed pain and then the callus on her foot began to get soft and then ulcerate. She denies prior e/o foot ulcers but admits to fevers and chills.  Past Medical History:  Diagnosis Date  . Allergy   . Anemia   . Anxiety   . Blood transfusion without reported diagnosis    Dec 2018  . Cataract    right eye  . COVID-19 03/2020  . Depression   . Diabetes type 1, uncontrolled ([place]) 11/14/2011   Since age 8  . Fibromyalgia   . Gastroparesis   . GERD (gastroesophageal reflux disease)   . Hypertension   . Infection    UTI April 2016    Past Surgical History:  Procedure Laterality Date  . ANKLE SURGERY    . CHOLECYSTECTOMY  11/15/2011   Procedure: LAPAROSCOPIC CHOLECYSTECTOMY WITH INTRAOPERATIVE CHOLANGIOGRAM;  Surgeon: [name], MD;  Location: [facility];  Service: General;  Laterality: N/A;  . COLONOSCOPY    . COLONOSCOPY WITH PROPOFOL N/A 06/27/2017   Procedure: COLONOSCOPY WITH PROPOFOL;  Surgeon: Milus Banister, MD;  Location: [facility];  Service: Endoscopy;  Laterality: N/A;  . ESOPHAGOGASTRODUODENOSCOPY  12/03/2011   Procedure: ESOPHAGOGASTRODUODENOSCOPY (EGD);  Surgeon: Beryle Beams, MD;  Location: [facility];  Service: Endoscopy;  Laterality: N/A;  . FLEXIBLE SIGMOIDOSCOPY N/A 03/10/2017   Procedure: FLEXIBLE SIGMOIDOSCOPY;  Surgeon: Carol Ada, MD;  Location: [facility];  Service: Endoscopy;  Laterality: N/A;  . INCISION AND DRAINAGE PERIRECTAL ABSCESS N/A 03/01/2017   Procedure: IRRIGATION AND DEBRIDEMENT PERIRECTAL ABSCESS;  Surgeon: Alphonsa Overall, MD;  Location: [facility];  Service: General;  Laterality: N/A;  . IRRIGATION AND DEBRIDEMENT BUTTOCKS N/A 03/23/2017   Procedure: IRRIGATION  AND DEBRIDEMENT BUTTOCKS, SETON PLACEMENT;  Surgeon: [name], MD;  Location: [facility];  Service: General;  Laterality: N/A;  . LAPAROSCOPY  11/23/2011   Procedure: LAPAROSCOPY DIAGNOSTIC;  Surgeon: Edward Jolly, MD;  Location: [facility];  Service: General;  Laterality: N/A;  . SIGMOIDOSCOPY    . UPPER GASTROINTESTINAL ENDOSCOPY    . WISDOM TOOTH EXTRACTION      Family History  Problem Relation Age of Onset  . Diabetes Mother   . Hypertension Father   . Colon cancer Paternal Grandmother        pt thinks PGM was dx in her 53's  . Diabetes Paternal Grandmother   . Diabetes Maternal Grandmother   . Diabetes Maternal Grandfather   . Diabetes Paternal Grandfather   . Diabetes Other   . Breast cancer Paternal Aunt   . Esophageal cancer Neg Hx   . Liver cancer Neg Hx   . Pancreatic cancer Neg Hx   . Stomach cancer Neg Hx   . Rectal cancer Neg Hx     Social History:  reports that she has never smoked. She has never used smokeless tobacco. She reports current drug use. Drug: Marijuana. She reports that she does not drink alcohol.  Allergies:  Allergies  Allergen Reactions  . Other Anaphylaxis    Reaction to [place] nuts   . Lactose Intolerance (Gi) Diarrhea    Medications: I have reviewed the patient's current medications.  Results for orders placed or performed during the hospital encounter of  04/09/20 (from the past 48 hour(s))  Comprehensive metabolic panel     Status: Abnormal   Collection Time: 04/09/20  3:22 PM  Result Value Ref Range   Sodium 131 (L) 135 - 145 mmol/L   Potassium 4.7 3.5 - 5.1 mmol/L   Chloride 98 98 - 111 mmol/L   CO2 25 22 - 32 mmol/L   Glucose, Bld 94 70 - 99 mg/dL    Comment: Glucose reference range applies only to samples taken after fasting for at least 8 hours.   BUN 23 (H) 6 - 20 mg/dL   Creatinine, Ser 1.28 (H) 0.44 - 1.00 mg/dL   Calcium 8.4 (L) 8.9 - 10.3 mg/dL   Total Protein 7.8 6.5 - 8.1 g/dL   Albumin 2.4 (L) 3.5 - 5.0 g/dL    AST 15 15 - 41 U/L   ALT 12 0 - 44 U/L   Alkaline Phosphatase 75 38 - 126 U/L   Total Bilirubin 0.6 0.3 - 1.2 mg/dL   GFR, Estimated 58 (L) >60 mL/min    Comment: (NOTE) Calculated using the CKD-EPI Creatinine Equation (2021)    Anion gap 8 5 - 15    Comment: Performed at [facility], [address] [place], [place] [address]  Lactic acid, plasma     Status: None   Collection Time: 04/09/20  3:22 PM  Result Value Ref Range   Lactic Acid, Venous 0.6 0.5 - 1.9 mmol/L    Comment: Performed at [facility], [address] [place], [place] [address]  CBC with Differential     Status: Abnormal   Collection Time: 04/09/20  3:22 PM  Result Value Ref Range   WBC 7.3 4.0 - 10.5 K/uL   RBC 2.57 (L) 3.87 - 5.11 MIL/uL   Hemoglobin 7.1 (L) 12.0 - 15.0 g/dL   HCT 22.5 (L) 36.0 - 46.0 %   MCV 87.5 80.0 - 100.0 fL   MCH 27.6 26.0 - 34.0 pg   MCHC 31.6 30.0 - 36.0 g/dL   RDW 13.0 11.5 - 15.5 %   Platelets 239 150 - 400 K/uL   nRBC 0.0 0.0 - 0.2 %   Neutrophils Relative % 69 %   Neutro Abs 5.1 1.7 - 7.7 K/uL   Lymphocytes Relative 19 %   Lymphs Abs 1.4 0.7 - 4.0 K/uL   Monocytes Relative 9 %   Monocytes Absolute 0.7 0.1 - 1.0 K/uL   Eosinophils Relative 2 %   Eosinophils Absolute 0.1 0.0 - 0.5 K/uL   Basophils Relative 0 %   Basophils Absolute 0.0 0.0 - 0.1 K/uL   Immature Granulocytes 1 %   Abs Immature Granulocytes 0.04 0.00 - 0.07 K/uL    Comment: Performed at [facility], [address] [place], [place] [address]  Culture, blood (routine x 2)     Status: None (Preliminary result)   Collection Time: 04/09/20  3:27 PM   Specimen: BLOOD  Result Value Ref Range   Specimen Description      BLOOD LEFT ANTECUBITAL Performed at [facility], [place] [address] [place], [place] [address]    Special Requests      BOTTLES DRAWN AEROBIC AND ANAEROBIC Blood Culture adequate volume Performed at [facility], [address] [place],  [place] [address]    Culture      NO GROWTH < 24 HOURS Performed at [facility], [place],  [place] [address]    Report Status PENDING   CBG monitoring, ED     Status: None   Collection Time: 04/09/20  3:55 PM  Result Value Ref Range   Glucose-Capillary 88 70 - 99 mg/dL    Comment: Glucose reference range applies only to samples taken after fasting for at least 8 hours.  Culture, blood (routine x 2)     Status: None (Preliminary result)   Collection Time: 04/09/20  4:07 PM   Specimen: BLOOD  Result Value Ref Range   Specimen Description      BLOOD RIGHT ANTECUBITAL Performed at [facility], [address] [place], [place] [address]    Special Requests      BOTTLES DRAWN AEROBIC AND ANAEROBIC Blood Culture adequate volume Performed at [facility], [address] [place], [place] [address]    Culture      NO GROWTH < 12 HOURS Performed at [facility], [place] [address] [place], [place] [address]    Report Status PENDING   Lactic acid, plasma     Status: None   Collection Time: 04/09/20  5:11 PM  Result Value Ref Range   Lactic Acid, Venous 0.5 0.5 - 1.9 mmol/L    Comment: Performed at [facility], [address] [place], [place] [address]  Sedimentation rate     Status: Abnormal   Collection Time: 04/09/20  5:11 PM  Result Value Ref Range   Sed Rate >140 (H) 0 - 22 mm/hr    Comment: Performed at [facility], [address] [place], [place] [address]  C-reactive protein     Status: Abnormal   Collection Time: 04/09/20  5:11 PM  Result Value Ref Range   CRP 12.6 (H) <1.0 mg/dL    Comment: Performed at [facility], [place] [address], [place], [place] [address]  Glucose, capillary     Status: Abnormal   Collection Time: 04/10/20  6:57 AM  Result Value Ref Range   Glucose-Capillary 254 (H) 70 - 99 mg/dL    Comment: Glucose reference range applies only to samples taken after fasting for at least 8 hours.   CBC     Status: Abnormal   Collection Time: 04/10/20  7:14 AM  Result Value Ref Range   WBC 8.1 4.0 - 10.5 K/uL   RBC 2.30 (L) 3.87 - 5.11 MIL/uL   Hemoglobin 6.3 (LL) 12.0 - 15.0 g/dL    Comment: REPEATED TO VERIFY THIS CRITICAL RESULT HAS VERIFIED AND BEEN CALLED TO B.SHERER,RN BY BONNIE DAVIS ON 01 11 2022 AT 0920, AND HAS BEEN READ BACK.     HCT 19.8 (L) 36.0 - 46.0 %   MCV 86.1 80.0 - 100.0 fL   MCH 27.4 26.0 - 34.0 pg   MCHC 31.8 30.0 - 36.0 g/dL   RDW 12.9 11.5 - 15.5 %   Platelets 242 150 - 400 K/uL   nRBC 0.0 0.0 - 0.2 %    Comment: Performed at [place] [address], [place], [place] [address]  Creatinine, serum     Status: Abnormal   Collection Time: 04/10/20  7:14 AM  Result Value Ref Range   Creatinine, Ser 1.25 (H) 0.44 - 1.00 mg/dL   GFR, Estimated 59 (L) >60 mL/min    Comment: (NOTE) Calculated using the CKD-EPI Creatinine Equation (2021) Performed at [facility] [place] [address] [place], [place] [address]  Glucose, capillary     Status: Abnormal   Collection Time: 04/10/20  9:27 AM  Result Value Ref Range   Glucose-Capillary 228 (H) 70 - 99 mg/dL    Comment: Glucose reference range applies only to samples taken after fasting for at least 8 hours.    MR FOOT RIGHT WO CONTRAST  Result Date: 04/10/2020 CLINICAL DATA:  Diabetic foot ulcer adjacent to the first MTP joint. A EXAM: MRI OF THE RIGHT FOREFOOT WITHOUT CONTRAST TECHNIQUE: Multiplanar, multisequence MR imaging of the right forefoot was performed. No intravenous contrast was administered. COMPARISON:  X-ray 04/09/2020 FINDINGS: Bones/Joint/Cartilage No acute fracture. No malalignment. There is subtle marrow edema within the medial hallux sesamoid (series 4, image 22) with patchy low T1 marrow signal (series 3, image 22). Elsewhere, no additional sites of bone marrow edema within the right forefoot. No bony erosion or periostitis. Small first MTP joint effusion. Ligaments Intact Lisfranc  ligament. Collateral ligaments of the forefoot intact. Muscles and Tendons Mild atrophy of the intrinsic foot musculature suggesting chronic denervation changes. Intact flexor and extensor tendons. Trace tenosynovial fluid within the flexor hallucis longus tendon sheath just proximal to the first MTP joint. Soft tissues Soft tissue ulceration overlying the lateral aspect of the forefoot at the level of the first metatarsal head. There is mild soft tissue edema surrounding. No organized fluid collection. There is nonspecific subcutaneous edema throughout the dorsum of the forefoot. IMPRESSION: 1. Soft tissue ulceration overlying the lateral aspect of the forefoot at the level of the first metatarsal head with adjacent cellulitis. No organized fluid collection/abscess. 2. Subtle marrow edema within the medial hallux sesamoid with patchy low T1 marrow signal. Findings are favored to represent acute osteomyelitis given the proximity of adjacent soft tissue infection. Sequela of chronic sesamoiditis could have a similar appearance, although is felt to be less likely. 3. Small first MTP joint effusion, which may be reactive or reflect septic arthritis. 4. Trace tenosynovial fluid within the flexor hallucis longus tendon sheath just proximal to the first MTP joint, suggesting a mild tenosynovitis which could be reactive or infectious. Electronically Signed   By: Davina Poke D.O.   On: 04/10/2020 09:46   DG Chest Portable 1 View  Result Date: 04/09/2020 CLINICAL DATA:  COVID positive with wound infection of the foot. EXAM: PORTABLE CHEST 1 VIEW COMPARISON:  Multiple priors including CTA chest abdomen pelvis March 20, 2020 and chest radiograph January 19, 2020. FINDINGS: The heart size and mediastinal contours are within normal limits. Both lungs are clear. The visualized skeletal structures are unremarkable. IMPRESSION: No active disease. Electronically Signed   By: Dahlia Bailiff MD   On: 04/09/2020 17:16    DG Foot Complete Right  Result Date: 04/09/2020 CLINICAL DATA:  Wound to right great toe EXAM: RIGHT FOOT COMPLETE - 3+ VIEW COMPARISON:  None. FINDINGS: Soft tissue swelling with underlying soft tissue gas along the medial aspect of the 1st metatarsal head. No underlying cortical irregularity. No fracture or dislocation is seen. The joint spaces are preserved. Postsurgical changes involving the distal tibia and fibula. IMPRESSION: Soft tissue swelling with underlying soft tissue gas along the medial aspect of the 1st metatarsal head. No radiographic findings to suggests acute osteomyelitis. Electronically Signed   By: Julian Hy M.D.   On: 04/09/2020 15:37    Review of Systems  Constitutional: Positive for chills and fever. Negative for diaphoresis.  HENT: Negative for ear discharge, ear pain, hearing loss and tinnitus.   Eyes: Negative for photophobia  and pain.  Respiratory: Negative for cough and shortness of breath.   Cardiovascular: Negative for chest pain.  Gastrointestinal: Negative for abdominal pain, nausea and vomiting.  Genitourinary: Negative for dysuria, flank pain, frequency and urgency.  Musculoskeletal: Positive for arthralgias (Right foot). Negative for back pain, myalgias and neck pain.  Neurological: Negative for dizziness and headaches.  Hematological: Does not bruise/bleed easily.  Psychiatric/Behavioral: The patient is not nervous/anxious.    Blood pressure 138/89, pulse 96, temperature 98.6 F (37 C), temperature source Oral, resp. rate 16, height 5' 7"  (1.702 m), weight 60 kg, last menstrual period 03/24/2020, SpO2 99 %. Physical Exam Constitutional:      General: She is not in acute distress.    Appearance: She is well-developed and well-nourished. She is not diaphoretic.  HENT:     Head: Normocephalic and atraumatic.  Eyes:     General: No scleral icterus.       Right eye: No discharge.        Left eye: No discharge.     Conjunctiva/sclera:  Conjunctivae normal.  Cardiovascular:     Rate and Rhythm: Normal rate and regular rhythm.  Pulmonary:     Effort: Pulmonary effort is normal. No respiratory distress.  Musculoskeletal:     Cervical back: Normal range of motion.     Comments: RLE No traumatic wounds, ecchymosis, or rash  Foot diffusely mod TTP  No knee or ankle effusion  Knee stable to varus/ valgus and anterior/posterior stress  Sens DPN, SPN, TN intact  Motor EHL, ext, flex, evers 5/5  DP 1+, PT 0, 2+ NP edema, ulceration over medial 1st MTP joint  Skin:    General: Skin is warm and dry.  Neurological:     Mental Status: She is alert.  Psychiatric:        Mood and Affect: Mood and affect normal.        Behavior: Behavior normal.     Assessment/Plan: Right foot ulcer -- Will check ABI's, may need vascular intervention. Dr. [name] to evaluate later today or in AM. Multiple medical problems including type 1 diabetes mellitus, essential hypertension, and depression/anxiety -- per primary service    Lisette Abu, PA-C Orthopedic Surgery [phone] 04/10/2020, 10:21 AM

## 2020-04-10 NOTE — H&P (Signed)
History and Physical    ALANEE TING DQQ:[id number] DOB: March 04, 1991 DOA: 04/09/2020  PCP: [name], MD  Patient coming from: Home.  Chief Complaint: Right foot swelling.  HPI: [name] is a 30 y.o. female with known history of diabetes mellitus type 1, hypertension and depression and anxiety presents to the ER with complaints of increasing swelling and discharge from the right foot.  Patient states the symptoms started about a week ago when patient's callus on the right foot plantar aspect started having a small ulcer with discharge which gradually worsened with swelling over the entire foot.  Patient also had subjective feeling of fever chills.  Had some upper respiratory tract symptoms at the same time.  Patient's husband also was positive for COVID at that time.  ED Course: In the ER x-rays show right foot swelling and some gas with ulceration.  No definite evidence of osteomyelitis.  Blood pressure was markedly elevated.  Sed rate is markedly elevated.  Blood cultures were obtained and empiric antibiotic started for cellulitis.  Patient's COVID test came back positive chest x-ray was unremarkable with no infiltrates and patient was not hypoxic.  Patient's hemoglobin is 7.1 with creatinine of 1.2 sodium 131.  CRP is 12.6.  Review of Systems: As per HPI, rest all negative.   Past Medical History:  Diagnosis Date  . Allergy   . Anemia   . Anxiety   . Blood transfusion without reported diagnosis    Dec 2018  . Cataract    right eye  . COVID-19 03/2020  . Depression   . Diabetes type 1, uncontrolled ([place]) 11/14/2011   Since age 43  . Fibromyalgia   . Gastroparesis   . GERD (gastroesophageal reflux disease)   . Hypertension   . Infection    UTI April 2016    Past Surgical History:  Procedure Laterality Date  . ANKLE SURGERY    . CHOLECYSTECTOMY  11/15/2011   Procedure: LAPAROSCOPIC CHOLECYSTECTOMY WITH INTRAOPERATIVE CHOLANGIOGRAM;  Surgeon: [name], MD;   Location: [facility];  Service: General;  Laterality: N/A;  . COLONOSCOPY    . COLONOSCOPY WITH PROPOFOL N/A 06/27/2017   Procedure: COLONOSCOPY WITH PROPOFOL;  Surgeon: Milus Banister, MD;  Location: [facility];  Service: Endoscopy;  Laterality: N/A;  . ESOPHAGOGASTRODUODENOSCOPY  12/03/2011   Procedure: ESOPHAGOGASTRODUODENOSCOPY (EGD);  Surgeon: Beryle Beams, MD;  Location: [facility];  Service: Endoscopy;  Laterality: N/A;  . FLEXIBLE SIGMOIDOSCOPY N/A 03/10/2017   Procedure: FLEXIBLE SIGMOIDOSCOPY;  Surgeon: Carol Ada, MD;  Location: [facility];  Service: Endoscopy;  Laterality: N/A;  . INCISION AND DRAINAGE PERIRECTAL ABSCESS N/A 03/01/2017   Procedure: IRRIGATION AND DEBRIDEMENT PERIRECTAL ABSCESS;  Surgeon: Alphonsa Overall, MD;  Location: [facility];  Service: General;  Laterality: N/A;  . IRRIGATION AND DEBRIDEMENT BUTTOCKS N/A 03/23/2017   Procedure: IRRIGATION AND DEBRIDEMENT BUTTOCKS, SETON PLACEMENT;  Surgeon: [name], MD;  Location: [facility];  Service: General;  Laterality: N/A;  . LAPAROSCOPY  11/23/2011   Procedure: LAPAROSCOPY DIAGNOSTIC;  Surgeon: Edward Jolly, MD;  Location: [facility];  Service: General;  Laterality: N/A;  . SIGMOIDOSCOPY    . UPPER GASTROINTESTINAL ENDOSCOPY    . WISDOM TOOTH EXTRACTION       reports that she has never smoked. She has never used smokeless tobacco. She reports current drug use. Drug: Marijuana. She reports that she does not drink alcohol.  Allergies  Allergen Reactions  . Other Anaphylaxis    Reaction to  [place] nuts   . Lactose Intolerance (Gi) Diarrhea    Family History  Problem Relation Age of Onset  . Diabetes Mother   . Hypertension Father   . Colon cancer Paternal Grandmother        pt thinks PGM was dx in her 67's  . Diabetes Paternal Grandmother   . Diabetes Maternal Grandmother   . Diabetes Maternal Grandfather   . Diabetes Paternal Grandfather   . Diabetes Other   . Breast cancer Paternal Aunt   .  Esophageal cancer Neg Hx   . Liver cancer Neg Hx   . Pancreatic cancer Neg Hx   . Stomach cancer Neg Hx   . Rectal cancer Neg Hx     Prior to Admission medications   Medication Sig Start Date End Date Taking? Authorizing Provider  aluminum-magnesium hydroxide-simethicone (MAALOX) 008-676-19 MG/5ML SUSP Take 30 mLs by mouth 3 (three) times daily as needed (indigestion).    [provider]  busPIRone (BUSPAR) 5 MG tablet Take 1 tablet (5 mg total) by mouth 3 (three) times daily. 02/03/20   [name], MD  carvedilol (COREG) 6.25 MG tablet Take 6.25 mg by mouth 2 (two) times daily with a meal.    [provider]  Continuous Blood Gluc Sensor (DEXCOM G6 SENSOR) MISC USE AS DIRECTED WITH CONTINUOUS BLOOD GLUCOSE MONITOR. REPLACE EVERY 10 DAYS 02/27/20   Renato Shin, MD  Continuous Blood Gluc Transmit (DEXCOM G6 TRANSMITTER) MISC USE AS DIRECTED TO  MONITOR  GLUCOSE  LEVELS 01/19/20   Renato Shin, MD  cyclobenzaprine (FLEXERIL) 10 MG tablet TAKE 1 TABLET(10 MG) BY MOUTH THREE TIMES DAILY AS NEEDED FOR MUSCLE SPASMS 04/02/20   [name], MD  dicyclomine (BENTYL) 20 MG tablet Take 1 tablet (20 mg total) by mouth every 8 (eight) hours as needed for spasms. 11/25/19   Petrucelli, Samantha R, PA-C  hydrALAZINE (APRESOLINE) 50 MG tablet TAKE 1 TABLET(50 MG) BY MOUTH EVERY 6 HOURS Patient taking differently: Take 50 mg by mouth every 6 (six) hours. 01/30/20   [name], MD  Insulin NPH, Human,, Isophane, (HUMULIN N KWIKPEN) 100 UNIT/ML Kiwkpen Inject 20 Units into the skin every morning. 03/02/20   Renato Shin, MD  isosorbide mononitrate (IMDUR) 30 MG 24 hr tablet Take 30 mg by mouth daily. 01/03/20   [provider]  loperamide (IMODIUM) 2 MG capsule Take 6 mg by mouth daily as needed for diarrhea or loose stools.     [provider]  LORazepam (ATIVAN) 0.5 MG tablet TAKE 1 TABLET(0.5 MG) BY MOUTH TWICE DAILY AS NEEDED FOR ANXIETY Patient taking  differently: Take 0.5 mg by mouth 2 (two) times daily as needed for anxiety. 03/07/20   [name], MD  metoCLOPramide (REGLAN) 5 MG tablet Take 1 tablet (5 mg total) by mouth in the morning and at bedtime. Please keep your January appt for further refills. Thank you 03/28/20   Armbruster, Carlota Raspberry, MD  mirtazapine (REMERON) 30 MG tablet TAKE 1 TABLET BY MOUTH AT  BEDTIME Patient taking differently: Take 30 mg by mouth at bedtime. 01/17/20   [name], MD  ondansetron (ZOFRAN ODT) 4 MG disintegrating tablet Take 1 tablet (4 mg total) by mouth every 8 (eight) hours as needed for nausea or vomiting. 04/06/20   Lucretia Kern, DO  pantoprazole (PROTONIX) 40 MG tablet Take 1 tablet (40 mg total) by mouth 2 (two) times daily. 12/06/19   Armbruster, Carlota Raspberry, MD  promethazine [facility])  25 MG suppository Place 1 suppository (25 mg total) rectally every 6 (six) hours as needed for nausea or vomiting. 03/20/20   Mesner, Corene Cornea, MD  promethazine (PHENERGAN) 25 MG tablet Take 1 tablet (25 mg total) by mouth every 6 (six) hours as needed for nausea or vomiting. 03/20/20   Mesner, Corene Cornea, MD  scopolamine (TRANSDERM-SCOP) 1 MG/3DAYS Place 1 patch onto the skin every 3 (three) days.    [provider]  sucralfate (CARAFATE) 1 GM/10ML suspension Take 10 mLs (1 g total) by mouth every 6 (six) hours as needed. Patient taking differently: Take 1 g by mouth every 6 (six) hours as needed (stomach). 11/24/19   Armbruster, Carlota Raspberry, MD  potassium chloride SA (KLOR-CON) 20 MEQ tablet Take 1 tablet (20 mEq total) by mouth daily for 5 doses. 07/04/19 07/30/19  Janeece Fitting, PA-C    Physical Exam: Constitutional: Moderately built and nourished. Vitals:   04/09/20 1540 04/09/20 1738 04/09/20 2149 04/10/20 0100  BP: (!) 169/90 (!) 160/95 122/87 (!) 166/94  Pulse: (!) 101 (!) 102 92 92  Resp: 18 18 20 18   Temp:   98.5 F (36.9 C) 99.7 F (37.6 C)  TempSrc:   Oral Oral  SpO2: 98% 100% 100% 100%  Weight:       Height:       Eyes: Anicteric no pallor. ENMT: No discharge from the ears eyes nose and mouth. Neck: No mass felt.  No neck rigidity. Respiratory: No rhonchi or crepitations. Cardiovascular: S1-S2 heard. Abdomen: Soft nontender bowel sounds present. Musculoskeletal: Swelling of the right foot with ulceration on the plantar aspect around the first metatarsal head. Skin: Ulceration on the right foot first metatarsal head on the plantar aspect with erythema involving the right foot extending up to the distal part of the right leg and there is a small ulceration on the anterior shin of the right leg. Neurologic: Alert awake oriented to time place and person.  Moves all extremities. Psychiatric: Appears normal.  Normal affect.   Labs on Admission: I have personally reviewed following labs and imaging studies  CBC: Recent Labs  Lab 04/09/20 1522  WBC 7.3  NEUTROABS 5.1  HGB 7.1*  HCT 22.5*  MCV 87.5  PLT 768   Basic Metabolic Panel: Recent Labs  Lab 04/09/20 1522  NA 131*  K 4.7  CL 98  CO2 25  GLUCOSE 94  BUN 23*  CREATININE 1.28*  CALCIUM 8.4*   GFR: Estimated Creatinine Clearance: 60.9 mL/min (A) (by C-G formula based on SCr of 1.28 mg/dL (H)). Liver Function Tests: Recent Labs  Lab 04/09/20 1522  AST 15  ALT 12  ALKPHOS 75  BILITOT 0.6  PROT 7.8  ALBUMIN 2.4*   No results for input(s): LIPASE, AMYLASE in the last 168 hours. No results for input(s): AMMONIA in the last 168 hours. Coagulation Profile: No results for input(s): INR, PROTIME in the last 168 hours. Cardiac Enzymes: No results for input(s): CKTOTAL, CKMB, CKMBINDEX, TROPONINI in the last 168 hours. BNP (last 3 results) No results for input(s): PROBNP in the last 8760 hours. HbA1C: No results for input(s): HGBA1C in the last 72 hours. CBG: Recent Labs  Lab 04/09/20 1555  GLUCAP 88   Lipid Profile: No results for input(s): CHOL, HDL, LDLCALC, TRIG, CHOLHDL, LDLDIRECT in the last 72  hours. Thyroid Function Tests: No results for input(s): TSH, T4TOTAL, FREET4, T3FREE, THYROIDAB in the last 72 hours. Anemia Panel: No results for input(s): VITAMINB12, FOLATE, FERRITIN, TIBC, IRON, RETICCTPCT in  the last 72 hours. Urine analysis:    Component Value Date/Time   COLORURINE YELLOW 03/22/2020 0427   APPEARANCEUR HAZY (A) 03/22/2020 0427   LABSPEC 1.022 03/22/2020 0427   PHURINE 6.0 03/22/2020 0427   GLUCOSEU >=500 (A) 03/22/2020 0427   HGBUR MODERATE (A) 03/22/2020 0427   BILIRUBINUR NEGATIVE 03/22/2020 0427   BILIRUBINUR negative 01/02/2015 1717   KETONESUR 5 (A) 03/22/2020 0427   PROTEINUR >=300 (A) 03/22/2020 0427   UROBILINOGEN 0.2 01/13/2015 0223   NITRITE NEGATIVE 03/22/2020 0427   LEUKOCYTESUR NEGATIVE 03/22/2020 0427   Sepsis Labs: @LABRCNTIP (procalcitonin:4,lacticidven:4) ) Recent Results (from the past 240 hour(s))  SARS CORONAVIRUS 2 (TAT 6-24 HRS) Nasopharyngeal Nasopharyngeal Swab     Status: Abnormal   Collection Time: 04/05/20 11:05 PM   Specimen: Nasopharyngeal Swab  Result Value Ref Range Status   SARS Coronavirus 2 POSITIVE (A) NEGATIVE Final    Comment: (NOTE) SARS-CoV-2 target nucleic acids are DETECTED.  The SARS-CoV-2 RNA is generally detectable in upper and lower respiratory specimens during the acute phase of infection. Positive results are indicative of the presence of SARS-CoV-2 RNA. Clinical correlation with patient history and other diagnostic information is  necessary to determine patient infection status. Positive results do not rule out bacterial infection or co-infection with other viruses.  The expected result is Negative.  Fact Sheet for Patients: SugarRoll.be  Fact Sheet for Healthcare Providers: https://www.woods-mathews.com/  This test is not yet approved or cleared by the [place] FDA and  has been authorized for detection and/or diagnosis of SARS-CoV-2 by FDA under an  Emergency Use Authorization (EUA). This EUA will remain  in effect (meaning this test can be used) for the duration of the COVID-19 declaration under Section 564(b)(1) of the Act, 21 U. S.C. section 360bbb-3(b)(1), unless the authorization is terminated or revoked sooner.   Performed at [facility], [place] [address], [place], [place] [address]      Radiological Exams on Admission: DG Chest Portable 1 View  Result Date: 04/09/2020 CLINICAL DATA:  COVID positive with wound infection of the foot. EXAM: PORTABLE CHEST 1 VIEW COMPARISON:  Multiple priors including CTA chest abdomen pelvis March 20, 2020 and chest radiograph January 19, 2020. FINDINGS: The heart size and mediastinal contours are within normal limits. Both lungs are clear. The visualized skeletal structures are unremarkable. IMPRESSION: No active disease. Electronically Signed   By: Dahlia Bailiff MD   On: 04/09/2020 17:16   DG Foot Complete Right  Result Date: 04/09/2020 CLINICAL DATA:  Wound to right great toe EXAM: RIGHT FOOT COMPLETE - 3+ VIEW COMPARISON:  None. FINDINGS: Soft tissue swelling with underlying soft tissue gas along the medial aspect of the 1st metatarsal head. No underlying cortical irregularity. No fracture or dislocation is seen. The joint spaces are preserved. Postsurgical changes involving the distal tibia and fibula. IMPRESSION: Soft tissue swelling with underlying soft tissue gas along the medial aspect of the 1st metatarsal head. No radiographic findings to suggests acute osteomyelitis. Electronically Signed   By: Julian Hy M.D.   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 Right foot cellulitis with  diabetic foot ulcer presently on empiric antibiotics follow-up cultures getting MRI of the right foot to further assess if there is any deep abscess or osteomyelitis.  Based on which will have further plans. 2. Hypertensive urgency not sure if patient was compliant with her medications.  I have restarted patient's home medications including hydralazine and Coreg and as needed IV hydralazine along with Imdur. 3. Diabetes mellitus type 2 patient states he takes Novolin N with sliding scale coverage which has been ordered. 4. Chronic anemia we will follow CBC. 5. History of anxiety depression we will continue home medications. 6. Recently admitted for DKA.  Since patient has cellulitis with uncontrolled hypertension will need further work-up and close monitoring inpatient status.   DVT prophylaxis: Heparin. Code Status: Full code. Family Communication: Discussed with patient. Disposition Plan: Home. Consults called: None. Admission status: Inpatient.   Rise Patience MD [facility] Pager [phone].  If 7PM-7AM, please contact night-coverage [email or web address] Password [facility]  04/10/2020, 5:27 AM

## 2020-04-10 NOTE — Progress Notes (Signed)
PROGRESS NOTE    [name]  EOF:[id number] DOB: [birth year] DOA: 04/09/2020 PCP: [name], MD    Brief Narrative:  [name] is a 30 year old female with past medical history significant for type 1 diabetes mellitus, essential hypertension, depression/anxiety who presented to the ED with progressive right foot pain, swelling and discharge.  Patient reports onset roughly 1 week ago when patient's callus on right foot plantar aspect started having a small ulcer now with purulent discharge.  Patient also reports subjective fevers and chills.  In the ED, temperature 99.6, HR 70, RR 19, BP 148/93, SPO2 97% on room air.  Sodium 131, potassium 4.7, chloride 98, CO2 25, glucose 94, BUN 23, creatinine 1.28, lactic acid 0.6.  WBC 7.3, hemoglobin 7.1, MCV 87.5, platelets 239.  Blood cultures x2 ordered.  Chest x-ray with no acute cardiopulmonary disease process.  Right foot x-ray with soft tissue swelling with underlying soft tissue gas along the medial aspect of the first metatarsal head, no findings to suggest acute osteomyelitis.    Recent COVID-19 PCR positive 04/05/2020. + exposure Hx with husband positive with COVID.  Patient is vaccinated against Covid-19 with [place], last dose 07/28/2019.  Hospital service consulted for further evaluation and management of diabetic foot ulcer.   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  Onset 1 week ago.  Associated with fever/chills.  Patient with low-grade temperature of 99.6 on arrival.  WBC count within  normal limits, lactic acid 0.6.  Elevated CRP of 12.6.  X-ray with soft tissue swelling with soft tissue gas along the medial aspect of first metatarsal head, no findings suggestive of acute osteomyelitis. --MR right foot: Pending --Orthopedics consulted for consideration of operative need --Continue empiric antibiotics with vancomycin and cefepime --wound care consult  Anemia, normocytic --Hemoglobin 7.1>6.3 --anemia panel --transfuse 1 unit pRBC --H/H following tranfusion  Acute Covid-19 viral infection during the ongoing Covid 19 Pandemic - POA Patient with positive COVID-19 PCR on 04/05/2020.  Positive exposure with husband.  Patient vaccinated against Cova-19 with [place], last dose reported 07/28/2019 --CRP 12.6 --ddimer 1.06 --Remdesivir, plan 3-day course (Day #1/3) --Continue supplemental oxygen, titrate to maintain SPO2 greater than 92%, currently oxygenating well on room air --Continue supportive care with albuterol MDI prn, vitamin C, zinc, Tylenol, antitussives (benzonatate/ Mucinex/Tussionex) --Follow CBC, CMP, D-dimer, ferritin, and CRP daily --Continue airborne/contact isolation precautions for 10 days from the day of diagnosis  The treatment plan and use of medications and known side effects were discussed with patient/family. Some of the medications used are based on case reports/anecdotal data.  All other medications being used in the management of COVID-19 based on limited study data.  Complete risks and long-term side effects are unknown, however in the best clinical judgment they seem to be of some benefit.  Patient wanted to proceed with treatment options provided.  Hyponatremia Sodium 131 on admission, suspect volume depletion with decreased oral intake prior to presentation. --NS at 64m/hr x 1 day --follow BMP daily  Type 1 diabetes mellitus Hemoglobin A1c  9.0 on 02/29/2020, poorly controlled.  Home regimen includes NPH 20 units every morning. --update Hemoglobin  A1C --Diabetic educator consult --continue home NPH 20u [place] qAM --SSI for further coverage --CBGs qAC/HS  Essential hypertension -- Carvedilol 6.25 mg p.o. twice daily -- Hydralazine 50 mg PO q6h -- Isosorbide mononitrate 30 mg p.o. daily  GERD: Continue Protonix 40 mg p.o. twice daily and Carafate 1 g p.o. q6h prn  Depression/anxiety:  --BuSpar 5 mg p.o. 3 times daily -- Lorazepam 0.5 mg p.o. twice daily as needed  Insomnia: Mirtazapine 30 mg p.o. nightly   DVT prophylaxis: Heparin Code Status: Full code Family Communication: Updated patient extensively at bedside  Disposition Plan:  Status is: Observation  The patient remains OBS appropriate and will d/c before 2 midnights.  Dispo: The patient is from: Home              Anticipated d/c is to: Home              Anticipated d/c date is: 2 days              Patient currently is not medically stable to d/c.   Consultants:   Orthopedics  Procedures:   None  Antimicrobials:   Zosyn 1/10 - 1/10  Vancomycin 1/10>>  Cefepime 1/11>>  Remdesivir 1/11>>    Subjective: Patient seen and examined at bedside, resting comfortably.  Complains of pain and swelling with continued discharge from right great toe.  About to go down to MRI.  Reports onset roughly 1 week ago which continues to slowly progress.  No other complaints or concerns at this time.  Currently denies headache, no fever/chills/night sweats, no nausea/vomiting/diarrhea, no chest pain, no palpitations, no shortness of breath, no abdominal pain, no weakness, no fatigue, no paresthesias.  No acute events overnight per nursing staff.  Objective: Vitals:   04/09/20 1738 04/09/20 2149 04/10/20 0100 04/10/20 0930  BP: (!) 160/95 122/87 (!) 166/94 138/89  Pulse: (!) 102 92 92 96  Resp: 18 20 18 16   Temp:  98.5 F (36.9 C) 99.7 F (37.6 C) 98.6 F (37 C)  TempSrc:  Oral Oral Oral  SpO2: 100% 100% 100% 99%  Weight:      Height:        Intake/Output  Summary (Last 24 hours) at 04/10/2020 0931 Last data filed at 04/09/2020 1818 Gross per 24 hour  Intake 252.34 ml  Output --  Net 252.34 ml   Filed Weights   04/09/20 1246  Weight: 60 kg    Examination:  General exam: Appears calm and comfortable  Respiratory system: Clear to auscultation. Respiratory effort normal. Cardiovascular system: S1 & S2 heard, RRR. No JVD, murmurs, rubs, gallops or clicks. No pedal edema. Gastrointestinal system: Abdomen is nondistended, soft and nontender. No organomegaly or masses felt. Normal bowel sounds heard. Central nervous system: Alert and oriented. No focal neurological deficits. Extremities: Symmetric 5 x 5 power. Skin: Ulcer noted to lateral and plantar surface right great toe, malodorous with purulence Psychiatry: Judgement and insight appear normal. Mood & affect appropriate.           Data Reviewed: I have personally reviewed following labs and imaging studies  CBC: Recent Labs  Lab 04/09/20 1522 04/10/20 0714  WBC 7.3 8.1  NEUTROABS 5.1  --   HGB 7.1* 6.3*  HCT 22.5* 19.8*  MCV 87.5 86.1  PLT 239 948   Basic Metabolic Panel: Recent Labs  Lab 04/09/20 1522 04/10/20 0714  NA 131*  --  K 4.7  --   CL 98  --   CO2 25  --   GLUCOSE 94  --   BUN 23*  --   CREATININE 1.28* 1.25*  CALCIUM 8.4*  --    GFR: Estimated Creatinine Clearance: 62.3 mL/min (A) (by C-G formula based on SCr of 1.25 mg/dL (H)). Liver Function Tests: Recent Labs  Lab 04/09/20 1522  AST 15  ALT 12  ALKPHOS 75  BILITOT 0.6  PROT 7.8  ALBUMIN 2.4*   No results for input(s): LIPASE, AMYLASE in the last 168 hours. No results for input(s): AMMONIA in the last 168 hours. Coagulation Profile: No results for input(s): INR, PROTIME in the last 168 hours. Cardiac Enzymes: No results for input(s): CKTOTAL, CKMB, CKMBINDEX, TROPONINI in the last 168 hours. BNP (last 3 results) No results for input(s): PROBNP in the last 8760 hours. HbA1C: No  results for input(s): HGBA1C in the last 72 hours. CBG: Recent Labs  Lab 04/09/20 1555 04/10/20 0657 04/10/20 0927  GLUCAP 88 254* 228*   Lipid Profile: No results for input(s): CHOL, HDL, LDLCALC, TRIG, CHOLHDL, LDLDIRECT in the last 72 hours. Thyroid Function Tests: No results for input(s): TSH, T4TOTAL, FREET4, T3FREE, THYROIDAB in the last 72 hours. Anemia Panel: No results for input(s): VITAMINB12, FOLATE, FERRITIN, TIBC, IRON, RETICCTPCT in the last 72 hours. Sepsis Labs: Recent Labs  Lab 04/09/20 1522 04/09/20 1711  LATICACIDVEN 0.6 0.5    Recent Results (from the past 240 hour(s))  SARS CORONAVIRUS 2 (TAT 6-24 HRS) Nasopharyngeal Nasopharyngeal Swab     Status: Abnormal   Collection Time: 04/05/20 11:05 PM   Specimen: Nasopharyngeal Swab  Result Value Ref Range Status   SARS Coronavirus 2 POSITIVE (A) NEGATIVE Final    Comment: (NOTE) SARS-CoV-2 target nucleic acids are DETECTED.  The SARS-CoV-2 RNA is generally detectable in upper and lower respiratory specimens during the acute phase of infection. Positive results are indicative of the presence of SARS-CoV-2 RNA. Clinical correlation with patient history and other diagnostic information is  necessary to determine patient infection status. Positive results do not rule out bacterial infection or co-infection with other viruses.  The expected result is Negative.  Fact Sheet for Patients: SugarRoll.be  Fact Sheet for Healthcare Providers: https://www.woods-mathews.com/  This test is not yet approved or cleared by the [place] FDA and  has been authorized for detection and/or diagnosis of SARS-CoV-2 by FDA under an Emergency Use Authorization (EUA). This EUA will remain  in effect (meaning this test can be used) for the duration of the COVID-19 declaration under Section 564(b)(1) of the Act, 21 U. S.C. section 360bbb-3(b)(1), unless the authorization is terminated  or revoked sooner.   Performed at [facility], [place] [address] [place], [place] [address]   Culture, blood (routine x 2)     Status: None (Preliminary result)   Collection Time: 04/09/20  3:27 PM   Specimen: BLOOD  Result Value Ref Range Status   Specimen Description   Final    BLOOD LEFT ANTECUBITAL Performed at [facility], [place] [address] [place], [place] [address]    Special Requests   Final    BOTTLES DRAWN AEROBIC AND ANAEROBIC Blood Culture adequate volume Performed at [facility], [place] [place], [place] [address]    Culture   Final    NO GROWTH < 24 HOURS Performed at [facility], [place] [address], [place],  [address]    Report  Status PENDING  Incomplete  Culture, blood (routine x 2)     Status: None (Preliminary result)   Collection Time: 04/09/20  4:07 PM   Specimen: BLOOD  Result Value Ref Range Status   Specimen Description   Final    BLOOD RIGHT ANTECUBITAL Performed at [facility], [address] [place], [place] [address]    Special Requests   Final    BOTTLES DRAWN AEROBIC AND ANAEROBIC Blood Culture adequate volume Performed at [facility], [address] [place], [place] [address]    Culture   Final    NO GROWTH < 12 HOURS Performed at [facility], [place] [address] [place], [place] [address]    Report Status PENDING  Incomplete         Radiology Studies: DG Chest Portable 1 View  Result Date: 04/09/2020 CLINICAL DATA:  COVID positive with wound infection of the foot. EXAM: PORTABLE CHEST 1 VIEW COMPARISON:  Multiple priors including CTA chest abdomen pelvis March 20, 2020 and chest radiograph January 19, 2020. FINDINGS: The heart size and mediastinal contours are within normal limits. Both lungs are clear. The visualized skeletal structures are unremarkable. IMPRESSION: No active disease. Electronically Signed   By: Dahlia Bailiff MD   On: 04/09/2020 17:16   DG Foot  Complete Right  Result Date: 04/09/2020 CLINICAL DATA:  Wound to right great toe EXAM: RIGHT FOOT COMPLETE - 3+ VIEW COMPARISON:  None. FINDINGS: Soft tissue swelling with underlying soft tissue gas along the medial aspect of the 1st metatarsal head. No underlying cortical irregularity. No fracture or dislocation is seen. The joint spaces are preserved. Postsurgical changes involving the distal tibia and fibula. IMPRESSION: Soft tissue swelling with underlying soft tissue gas along the medial aspect of the 1st metatarsal head. No radiographic findings to suggests acute osteomyelitis. Electronically Signed   By: Julian Hy M.D.   On: 04/09/2020 15:37        Scheduled Meds: . sodium chloride   Intravenous Once  . vitamin C  500 mg Oral Daily  . busPIRone  5 mg Oral TID  . carvedilol  6.25 mg Oral BID WC  . heparin  5,000 Units Subcutaneous Q8H  . hydrALAZINE  50 mg Oral Q6H  . insulin aspart  0-9 Units Subcutaneous TID WC  . insulin NPH Human  20 Units Subcutaneous [id number]  . isosorbide mononitrate  30 mg Oral Daily  . mirtazapine  30 mg Oral QHS  . pantoprazole  40 mg Oral BID  . scopolamine  1 patch Transdermal Q72H  . zinc sulfate  220 mg Oral Daily   Continuous Infusions: . sodium chloride Stopped (04/09/20 1818)  . ceFEPime (MAXIPIME) IV    . remdesivir 200 mg in sodium chloride 0.9% 250 mL IVPB     Followed by  . [START ON 04/11/2020] remdesivir 100 mg in NS 100 mL    . vancomycin       LOS: 0 days    Time spent: 40 minutes spent on chart review, discussion with nursing staff, consultants, updating family and interview/physical exam; more than 50% of that time was spent in counseling and/or coordination of care.    Reizel Calzada J [place] DO [facility] Available via Epic secure chat 7am-7pm After these hours, please refer to coverage provider listed on [email or web address] 04/10/2020, 9:31 AM

## 2020-04-10 NOTE — Progress Notes (Signed)
Critical Lab Value: Hgb- 6.3. MD paged.

## 2020-04-10 NOTE — Progress Notes (Signed)
Came in pt room to complete blood transfusion. Pt blood sugar was 60. Gave her some juice and a bottle of glutose gel. Blood sugar was 49 after rechecking 15 minutes later. Gave pt 50% dextrose injection. Blood sugar was 127 after checking 15 minutes later At 1630.  Vitals stable. Dr [place] was notified.

## 2020-04-10 NOTE — Consult Note (Signed)
ORTHOPAEDIC CONSULTATION  REQUESTING PHYSICIAN: [place] Eric J, DO  Chief Complaint: Ulceration right great hallux.  HPI: [name] is a 30 y.o. female who presents with ulceration right great toe.  Patient is a type I diabetic COVID positive with uncontrolled diabetes with severe protein caloric malnutrition.  She also has stage III renal disease.  Past Medical History:  Diagnosis Date  . Allergy   . Anemia   . Anxiety   . Blood transfusion without reported diagnosis    Dec 2018  . Cataract    right eye  . COVID-19 03/2020  . Depression   . Diabetes type 1, uncontrolled ([place]) 11/14/2011   Since age 23  . Fibromyalgia   . Gastroparesis   . GERD (gastroesophageal reflux disease)   . Hypertension   . Infection    UTI April 2016   Past Surgical History:  Procedure Laterality Date  . ANKLE SURGERY    . CHOLECYSTECTOMY  11/15/2011   Procedure: LAPAROSCOPIC CHOLECYSTECTOMY WITH INTRAOPERATIVE CHOLANGIOGRAM;  Surgeon: [name], MD;  Location: [facility];  Service: General;  Laterality: N/A;  . COLONOSCOPY    . COLONOSCOPY WITH PROPOFOL N/A 06/27/2017   Procedure: COLONOSCOPY WITH PROPOFOL;  Surgeon: Milus Banister, MD;  Location: [facility];  Service: Endoscopy;  Laterality: N/A;  . ESOPHAGOGASTRODUODENOSCOPY  12/03/2011   Procedure: ESOPHAGOGASTRODUODENOSCOPY (EGD);  Surgeon: Beryle Beams, MD;  Location: [facility];  Service: Endoscopy;  Laterality: N/A;  . FLEXIBLE SIGMOIDOSCOPY N/A 03/10/2017   Procedure: FLEXIBLE SIGMOIDOSCOPY;  Surgeon: Carol Ada, MD;  Location: [facility];  Service: Endoscopy;  Laterality: N/A;  . INCISION AND DRAINAGE PERIRECTAL ABSCESS N/A 03/01/2017   Procedure: IRRIGATION AND DEBRIDEMENT PERIRECTAL ABSCESS;  Surgeon: Alphonsa Overall, MD;  Location: [facility];  Service: General;  Laterality: N/A;  . IRRIGATION AND DEBRIDEMENT BUTTOCKS N/A 03/23/2017   Procedure: IRRIGATION AND DEBRIDEMENT BUTTOCKS, SETON PLACEMENT;  Surgeon: [name], MD;  Location: [facility];  Service: General;  Laterality: N/A;  . LAPAROSCOPY  11/23/2011   Procedure: LAPAROSCOPY DIAGNOSTIC;  Surgeon: Edward Jolly, MD;  Location: [facility];  Service: General;  Laterality: N/A;  . SIGMOIDOSCOPY    . UPPER GASTROINTESTINAL ENDOSCOPY    . WISDOM TOOTH EXTRACTION     Social History   Socioeconomic History  . Marital status: Married    Spouse name: sergio  . Number of children: 0  . Years of education: college  . Highest education level: Not on file  Occupational History  . Occupation: [employer] call center    Employer: [employer]  Tobacco Use  . Smoking status: Never Smoker  . Smokeless tobacco: Never Used  Vaping Use  . Vaping Use: Never used  Substance and Sexual Activity  . Alcohol use: No    Alcohol/week: 0.0 standard drinks  . Drug use: Yes    Types: Marijuana  . Sexual activity: Not Currently    Partners: Male    Birth control/protection: None  Other Topics Concern  . Not on file  Social History Narrative  . Not on file   Social Determinants of Health   Financial Resource Strain: Not on file  Food Insecurity: Not on file  Transportation Needs: Not on file  Physical Activity: Not on file  Stress: Not on file  Social Connections: Not on file   Family History  Problem Relation Age of Onset  . Diabetes Mother   . Hypertension Father   . Colon cancer Paternal Grandmother  pt thinks PGM was dx in her 66's  . Diabetes Paternal Grandmother   . Diabetes Maternal Grandmother   . Diabetes Maternal Grandfather   . Diabetes Paternal Grandfather   . Diabetes Other   . Breast cancer Paternal Aunt   . Esophageal cancer Neg Hx   . Liver cancer Neg Hx   . Pancreatic cancer Neg Hx   . Stomach cancer Neg Hx   . Rectal cancer Neg Hx    - negative except otherwise stated in the family history section Allergies  Allergen Reactions  . Other Anaphylaxis    Reaction to [place] nuts   . Lactose Intolerance (Gi)  Diarrhea   Prior to Admission medications   Medication Sig Start Date End Date Taking? Authorizing Provider  aluminum-magnesium hydroxide-simethicone (MAALOX) [phone] MG/5ML SUSP Take 30 mLs by mouth 3 (three) times daily as needed (indigestion).   Yes [provider]  carvedilol (COREG) 6.25 MG tablet Take 6.25 mg by mouth 2 (two) times daily with a meal.   Yes [provider]  cyclobenzaprine (FLEXERIL) 10 MG tablet TAKE 1 TABLET(10 MG) BY MOUTH THREE TIMES DAILY AS NEEDED FOR MUSCLE SPASMS 04/02/20  Yes [name], MD  dicyclomine (BENTYL) 20 MG tablet Take 1 tablet (20 mg total) by mouth every 8 (eight) hours as needed for spasms. 11/25/19  Yes Petrucelli, Samantha R, PA-C  hydrALAZINE (APRESOLINE) 50 MG tablet TAKE 1 TABLET(50 MG) BY MOUTH EVERY 6 HOURS Patient taking differently: Take 50 mg by mouth every 6 (six) hours. 01/30/20  Yes [name], MD  Insulin NPH, Human,, Isophane, (HUMULIN N KWIKPEN) 100 UNIT/ML Kiwkpen Inject 20 Units into the skin every morning. 03/02/20  Yes Renato Shin, MD  loperamide (IMODIUM) 2 MG capsule Take 6 mg by mouth daily as needed for diarrhea or loose stools.    Yes [provider]  LORazepam (ATIVAN) 0.5 MG tablet TAKE 1 TABLET(0.5 MG) BY MOUTH TWICE DAILY AS NEEDED FOR ANXIETY Patient taking differently: Take 0.5 mg by mouth 2 (two) times daily as needed for anxiety. 03/07/20  Yes [name], MD  metoCLOPramide (REGLAN) 5 MG tablet Take 1 tablet (5 mg total) by mouth in the morning and at bedtime. Please keep your January appt for further refills. Thank you 03/28/20  Yes Armbruster, Carlota Raspberry, MD  mirtazapine (REMERON) 30 MG tablet TAKE 1 TABLET BY MOUTH AT  BEDTIME Patient taking differently: Take 30 mg by mouth at bedtime. 01/17/20  Yes [name], MD  ondansetron (ZOFRAN ODT) 4 MG disintegrating tablet Take 1 tablet (4 mg total) by mouth every 8 (eight) hours as needed for nausea or vomiting. 04/06/20  Yes Colin [name] R, DO  pantoprazole (PROTONIX) 40 MG tablet Take 1 tablet (40 mg total) by mouth 2 (two) times daily. 12/06/19  Yes Armbruster, Carlota Raspberry, MD  Phenyleph-Doxylamine-DM-APAP (NYQUIL SEVERE COLD/FLU) 5-6.25-10-325 MG/15ML LIQD Take 15 mLs by mouth every 4 (four) hours as needed (cold symptoms & fever).   Yes [provider]  promethazine (PHENERGAN) 25 MG suppository Place 1 suppository (25 mg total) rectally every 6 (six) hours as needed for nausea or vomiting. 03/20/20  Yes Mesner, Corene Cornea, MD  promethazine (PHENERGAN) 25 MG tablet Take 1 tablet (25 mg total) by mouth every 6 (six) hours as needed for nausea or vomiting. 03/20/20  Yes Mesner, Corene Cornea, MD  scopolamine (TRANSDERM-SCOP) 1 MG/3DAYS Place 1 patch onto the skin every 3 (three) days.   Yes [provider]  busPIRone (  BUSPAR) 5 MG tablet Take 1 tablet (5 mg total) by mouth 3 (three) times daily. Patient not taking: Reported on 04/10/2020 02/03/20   [name], MD  Continuous Blood Gluc Sensor (DEXCOM G6 SENSOR) MISC USE AS DIRECTED WITH CONTINUOUS BLOOD GLUCOSE MONITOR. REPLACE EVERY 10 DAYS 02/27/20   Renato Shin, MD  Continuous Blood Gluc Transmit (DEXCOM G6 TRANSMITTER) MISC USE AS DIRECTED TO  MONITOR  GLUCOSE  LEVELS 01/19/20   Renato Shin, MD  isosorbide mononitrate (IMDUR) 30 MG 24 hr tablet Take 30 mg by mouth daily. Patient not taking: Reported on 04/10/2020 01/03/20   [provider]  sucralfate (CARAFATE) 1 GM/10ML suspension Take 10 mLs (1 g total) by mouth every 6 (six) hours as needed. Patient not taking: Reported on 04/10/2020 11/24/19   Yetta Flock, MD  potassium chloride SA (KLOR-CON) 20 MEQ tablet Take 1 tablet (20 mEq total) by mouth daily for 5 doses. 07/04/19 07/30/19  Janeece Fitting, PA-C   MR FOOT RIGHT WO CONTRAST  Result Date: 04/10/2020 CLINICAL DATA:  Diabetic foot ulcer adjacent to the first MTP joint. A EXAM: MRI OF THE RIGHT FOREFOOT WITHOUT CONTRAST TECHNIQUE: Multiplanar,  multisequence MR imaging of the right forefoot was performed. No intravenous contrast was administered. COMPARISON:  X-ray 04/09/2020 FINDINGS: Bones/Joint/Cartilage No acute fracture. No malalignment. There is subtle marrow edema within the medial hallux sesamoid (series 4, image 22) with patchy low T1 marrow signal (series 3, image 22). Elsewhere, no additional sites of bone marrow edema within the right forefoot. No bony erosion or periostitis. Small first MTP joint effusion. Ligaments Intact Lisfranc ligament. Collateral ligaments of the forefoot intact. Muscles and Tendons Mild atrophy of the intrinsic foot musculature suggesting chronic denervation changes. Intact flexor and extensor tendons. Trace tenosynovial fluid within the flexor hallucis longus tendon sheath just proximal to the first MTP joint. Soft tissues Soft tissue ulceration overlying the lateral aspect of the forefoot at the level of the first metatarsal head. There is mild soft tissue edema surrounding. No organized fluid collection. There is nonspecific subcutaneous edema throughout the dorsum of the forefoot. IMPRESSION: 1. Soft tissue ulceration overlying the lateral aspect of the forefoot at the level of the first metatarsal head with adjacent cellulitis. No organized fluid collection/abscess. 2. Subtle marrow edema within the medial hallux sesamoid with patchy low T1 marrow signal. Findings are favored to represent acute osteomyelitis given the proximity of adjacent soft tissue infection. Sequela of chronic sesamoiditis could have a similar appearance, although is felt to be less likely. 3. Small first MTP joint effusion, which may be reactive or reflect septic arthritis. 4. Trace tenosynovial fluid within the flexor hallucis longus tendon sheath just proximal to the first MTP joint, suggesting a mild tenosynovitis which could be reactive or infectious. Electronically Signed   By: Davina Poke D.O.   On: 04/10/2020 09:46   DG Chest  Portable 1 View  Result Date: 04/09/2020 CLINICAL DATA:  COVID positive with wound infection of the foot. EXAM: PORTABLE CHEST 1 VIEW COMPARISON:  Multiple priors including CTA chest abdomen pelvis March 20, 2020 and chest radiograph January 19, 2020. FINDINGS: The heart size and mediastinal contours are within normal limits. Both lungs are clear. The visualized skeletal structures are unremarkable. IMPRESSION: No active disease. Electronically Signed   By: Dahlia Bailiff MD   On: 04/09/2020 17:16   DG Foot Complete Right  Result Date: 04/09/2020 CLINICAL DATA:  Wound to right great toe EXAM: RIGHT FOOT COMPLETE - 3+ VIEW  COMPARISON:  None. FINDINGS: Soft tissue swelling with underlying soft tissue gas along the medial aspect of the 1st metatarsal head. No underlying cortical irregularity. No fracture or dislocation is seen. The joint spaces are preserved. Postsurgical changes involving the distal tibia and fibula. IMPRESSION: Soft tissue swelling with underlying soft tissue gas along the medial aspect of the 1st metatarsal head. No radiographic findings to suggests acute osteomyelitis. Electronically Signed   By: Julian Hy M.D.   On: 04/09/2020 15:37   - pertinent xrays, CT, MRI studies were reviewed and independently interpreted  Positive ROS: All other systems have been reviewed and were otherwise negative with the exception of those mentioned in the HPI and as above.  Physical Exam: General: Alert, no acute distress Psychiatric: Patient is competent for consent with normal mood and affect Lymphatic: No axillary or cervical lymphadenopathy Cardiovascular: No pedal edema Respiratory: No cyanosis, no use of accessory musculature GI: No organomegaly, abdomen is soft and non-tender    Images:  @ENCIMAGES @  Labs:  Lab Results  Component Value Date   HGBA1C 8.6 (H) 04/10/2020   HGBA1C 9.0 (A) 02/29/2020   HGBA1C 8.3 (H) 09/25/2019   ESRSEDRATE >140 (H) 04/09/2020    ESRSEDRATE 125 (H) 03/23/2017   ESRSEDRATE 67 (H) 11/26/2013   CRP 12.6 (H) 04/09/2020   CRP <0.5 05/25/2019   CRP <0.8 09/19/2018   LABURIC 6.8 (H) 01/23/2015   REPTSTATUS PENDING 04/09/2020   GRAMSTAIN  07/09/2017    FEW WBC PRESENT, PREDOMINANTLY PMN MODERATE GRAM POSITIVE RODS FEW GRAM POSITIVE COCCI RARE GRAM NEGATIVE RODS Performed at [facility], [place] [address] [place], [place] [address]    CULT  04/09/2020    NO GROWTH < 12 HOURS Performed at [facility] [address] [place], [place] [address]    LABORGA KLEBSIELLA PNEUMONIAE 03/01/2017    Lab Results  Component Value Date   ALBUMIN 2.4 (L) 04/09/2020   ALBUMIN 3.8 03/21/2020   ALBUMIN 3.3 (L) 03/19/2020   PREALBUMIN 17.9 (L) 06/09/2018   PREALBUMIN 21.0 02/18/2018   PREALBUMIN 18.1 03/18/2017   LABURIC 6.8 (H) 01/23/2015    Neurologic: Patient does not have protective sensation bilateral lower extremities.   MUSCULOSKELETAL:   Skin: Examination patient has swelling and cellulitis of the right lower extremity there is no swelling or cellulitis in the left lower extremity.  Patient has blistering ulcers on the medial column of the right foot she has a necrotic ulcer on the plantar aspect of the great toe MTP joint as well as a necrotic ulcer over the medial border of the MTP joint.  Patient does not have palpable pulses in either foot.  Albumin 2.4 hemoglobin A1c 8.6.  Radiographs of the right foot shows air in the soft tissue surrounding the first metatarsal head with out any destructive bony changes.  There is calcification of the arteries in her foot.  Assessment: Assessment: Uncontrolled type I diabetic with ulceration abscess right great toe MTP joint with peripheral vascular disease and most likely osteomyelitis.  With severe protein caloric malnutrition.  Plan: Plan: Ankle-brachial indices and MRI scan has been ordered.  Patient may require vascular intervention.  Surgical  intervention amputation of the first ray will be dependent on patient's vascular status.  Thank you for the consult and the opportunity to see Ms. [place], MD [place] [phone] 1:13 PM

## 2020-04-10 NOTE — Progress Notes (Signed)
Inpatient Diabetes Program Recommendations  AACE/ADA: New Consensus Statement on Inpatient Glycemic Control (2015)  Target Ranges:  Prepandial:   less than 140 mg/dL      Peak postprandial:   less than 180 mg/dL (1-2 hours)      Critically ill patients:  140 - 180 mg/dL   Lab Results  Component Value Date   GLUCAP 228 (H) 04/10/2020   HGBA1C 9.0 (A) 02/29/2020    Review of Glycemic Control Results for [name], [name] (MRN [id number]) as of 04/10/2020 11:29  Ref. Range 04/09/2020 15:55 04/10/2020 06:57 04/10/2020 09:27  Glucose-Capillary Latest Ref Range: 70 - 99 mg/dL 88 791 (H) 505 (H)  Diabetes history: DM 1 Outpatient Diabetes medications: NPH 20 units q AM Current orders for Inpatient glycemic control: Novolog sensitive tid with meals NPH 20 units q AM Inpatient Diabetes Program Recommendations:    Recommend d/c of NPH. Consider adding Lantus 10 units daily (starting tomorrow 04/11/20). Also consider reducing Novolog correction to very sensitive (0-6 units) tid with meals.    Thanks,  Beryl Meager, RN, BC-ADM Inpatient Diabetes Coordinator Pager [phone]

## 2020-04-10 NOTE — Progress Notes (Signed)
Pharmacy Antibiotic Note  [name] is a 30 y.o. female admitted on 04/09/2020 with cellulitis with diabetic foot ulcer.  Pharmacy has been consulted for vancomycin and cefepime dosing.  Plan: Rec'd vanc 1g last pm in ED. Vancomycin 1250mg  IV Q24H. Goal AUC 400-550.  Expected AUC: 515.  SCr used 1.28.  Cefepime 2g IV Q12H.  Height: 5\' 7"  (170.2 cm) Weight: 60 kg (132 lb 3.3 oz) IBW/kg (Calculated) : 61.6  Temp (24hrs), Avg:99.3 F (37.4 C), Min:98.5 F (36.9 C), Max:99.7 F (37.6 C)  Recent Labs  Lab 04/09/20 1522 04/09/20 1711  WBC 7.3  --   CREATININE 1.28*  --   LATICACIDVEN 0.6 0.5    Estimated Creatinine Clearance: 60.9 mL/min (A) (by C-G formula based on SCr of 1.28 mg/dL (H)).    Allergies  Allergen Reactions  . Other Anaphylaxis    Reaction to 06/07/20 nuts   . Lactose Intolerance (Gi) Diarrhea     Thank you for allowing pharmacy to be a part of this patient's care.  06/07/20, PharmD, BCPS  04/10/2020 6:14 AM

## 2020-04-10 NOTE — Consult Note (Signed)
[facility] Nurse Consult Note: Patient receiving care in [facility].  Consult completed remotely after review of record, including images. Reason for Consult: right foot wound Wound type: neuropathic with underlying osteomyelitis. Dr. [name] is involved in the planning of care for this patient.   Pressure Injury POA: Yes/No/NA Measurement: To be provided by the bedside RN in the flowsheet section Wound bed: see photo images Drainage (amount, consistency, odor)  Periwound: peeling Dressing procedure/placement/frequency: Apply Santyl to in a nickel thick layer. Cover with a saline moistened gauze, then dry gauze or ABD pad.  Change daily. Thank you for the consult. [facility] nurse will not follow at this time.  Please re-consult the [facility] team if needed.  Val Riles, RN, MSN, CWOCN, CNS-BC, pager [phone]

## 2020-04-11 ENCOUNTER — Inpatient Hospital Stay (HOSPITAL_COMMUNITY): Payer: 59

## 2020-04-11 DIAGNOSIS — D509 Iron deficiency anemia, unspecified: Secondary | ICD-10-CM

## 2020-04-11 DIAGNOSIS — L0390 Cellulitis, unspecified: Secondary | ICD-10-CM

## 2020-04-11 DIAGNOSIS — E611 Iron deficiency: Secondary | ICD-10-CM | POA: Diagnosis present

## 2020-04-11 LAB — COMPREHENSIVE METABOLIC PANEL
ALT: 11 U/L (ref 0–44)
AST: 16 U/L (ref 15–41)
Albumin: 2.1 g/dL — ABNORMAL LOW (ref 3.5–5.0)
Alkaline Phosphatase: 78 U/L (ref 38–126)
Anion gap: 11 (ref 5–15)
BUN: 19 mg/dL (ref 6–20)
CO2: 23 mmol/L (ref 22–32)
Calcium: 8.5 mg/dL — ABNORMAL LOW (ref 8.9–10.3)
Chloride: 100 mmol/L (ref 98–111)
Creatinine, Ser: 1.19 mg/dL — ABNORMAL HIGH (ref 0.44–1.00)
GFR, Estimated: >60 mL/min (ref >60)
Glucose, Bld: 87 mg/dL (ref 70–99)
Potassium: 4.2 mmol/L (ref 3.5–5.1)
Sodium: 134 mmol/L — ABNORMAL LOW (ref 135–145)
Total Bilirubin: 0.2 mg/dL — ABNORMAL LOW (ref 0.3–1.2)
Total Protein: 7.7 g/dL (ref 6.5–8.1)

## 2020-04-11 LAB — BPAM RBC
Blood Product Expiration Date: 202202122359
ISSUE DATE / TIME: 202201111254
Unit Type and Rh: 5100

## 2020-04-11 LAB — GLUCOSE, CAPILLARY
Glucose-Capillary: 113 mg/dL — ABNORMAL HIGH (ref 70–99)
Glucose-Capillary: 76 mg/dL (ref 70–99)
Glucose-Capillary: 114 mg/dL — ABNORMAL HIGH (ref 70–99)
Glucose-Capillary: 160 mg/dL — ABNORMAL HIGH (ref 70–99)
Glucose-Capillary: 149 mg/dL — ABNORMAL HIGH (ref 70–99)

## 2020-04-11 LAB — CBC WITH DIFFERENTIAL/PLATELET
Abs Immature Granulocytes: 0.06 10*3/uL (ref 0.00–0.07)
Basophils Absolute: 0 10*3/uL (ref 0.0–0.1)
Basophils Relative: 0 %
Eosinophils Absolute: 0 10*3/uL (ref 0.0–0.5)
Eosinophils Relative: 0 %
HCT: 24.5 % — ABNORMAL LOW (ref 36.0–46.0)
Hemoglobin: 8.3 g/dL — ABNORMAL LOW (ref 12.0–15.0)
Immature Granulocytes: 1 %
Lymphocytes Relative: 8 %
Lymphs Abs: 0.8 10*3/uL (ref 0.7–4.0)
MCH: 28.9 pg (ref 26.0–34.0)
MCHC: 33.9 g/dL (ref 30.0–36.0)
MCV: 85.4 fL (ref 80.0–100.0)
Monocytes Absolute: 0.7 10*3/uL (ref 0.1–1.0)
Monocytes Relative: 7 %
Neutro Abs: 8.4 10*3/uL — ABNORMAL HIGH (ref 1.7–7.7)
Neutrophils Relative %: 84 %
Platelets: 282 10*3/uL (ref 150–400)
RBC: 2.87 MIL/uL — ABNORMAL LOW (ref 3.87–5.11)
RDW: 12.8 % (ref 11.5–15.5)
WBC: 10.1 10*3/uL (ref 4.0–10.5)
nRBC: 0 % (ref 0.0–0.2)

## 2020-04-11 LAB — TYPE AND SCREEN
ABO/RH(D): O POS
Antibody Screen: NEGATIVE
Unit division: 0

## 2020-04-11 LAB — D-DIMER, QUANTITATIVE: D-Dimer, Quant: 3.31 ug/mL-FEU — ABNORMAL HIGH (ref 0.00–0.50)

## 2020-04-11 LAB — C-REACTIVE PROTEIN: CRP: 17.6 mg/dL — ABNORMAL HIGH (ref <1.0)

## 2020-04-11 IMAGING — DX DG ABD PORTABLE 1V
1 series · 1 of 1 positions shown · non-contrast
Comparison: [DATE]

CLINICAL DATA: Abdominal pain low back pain. Recent diagnosis COVID
infection.

EXAM:
PORTABLE ABDOMEN - 1 VIEW

[abdomen]
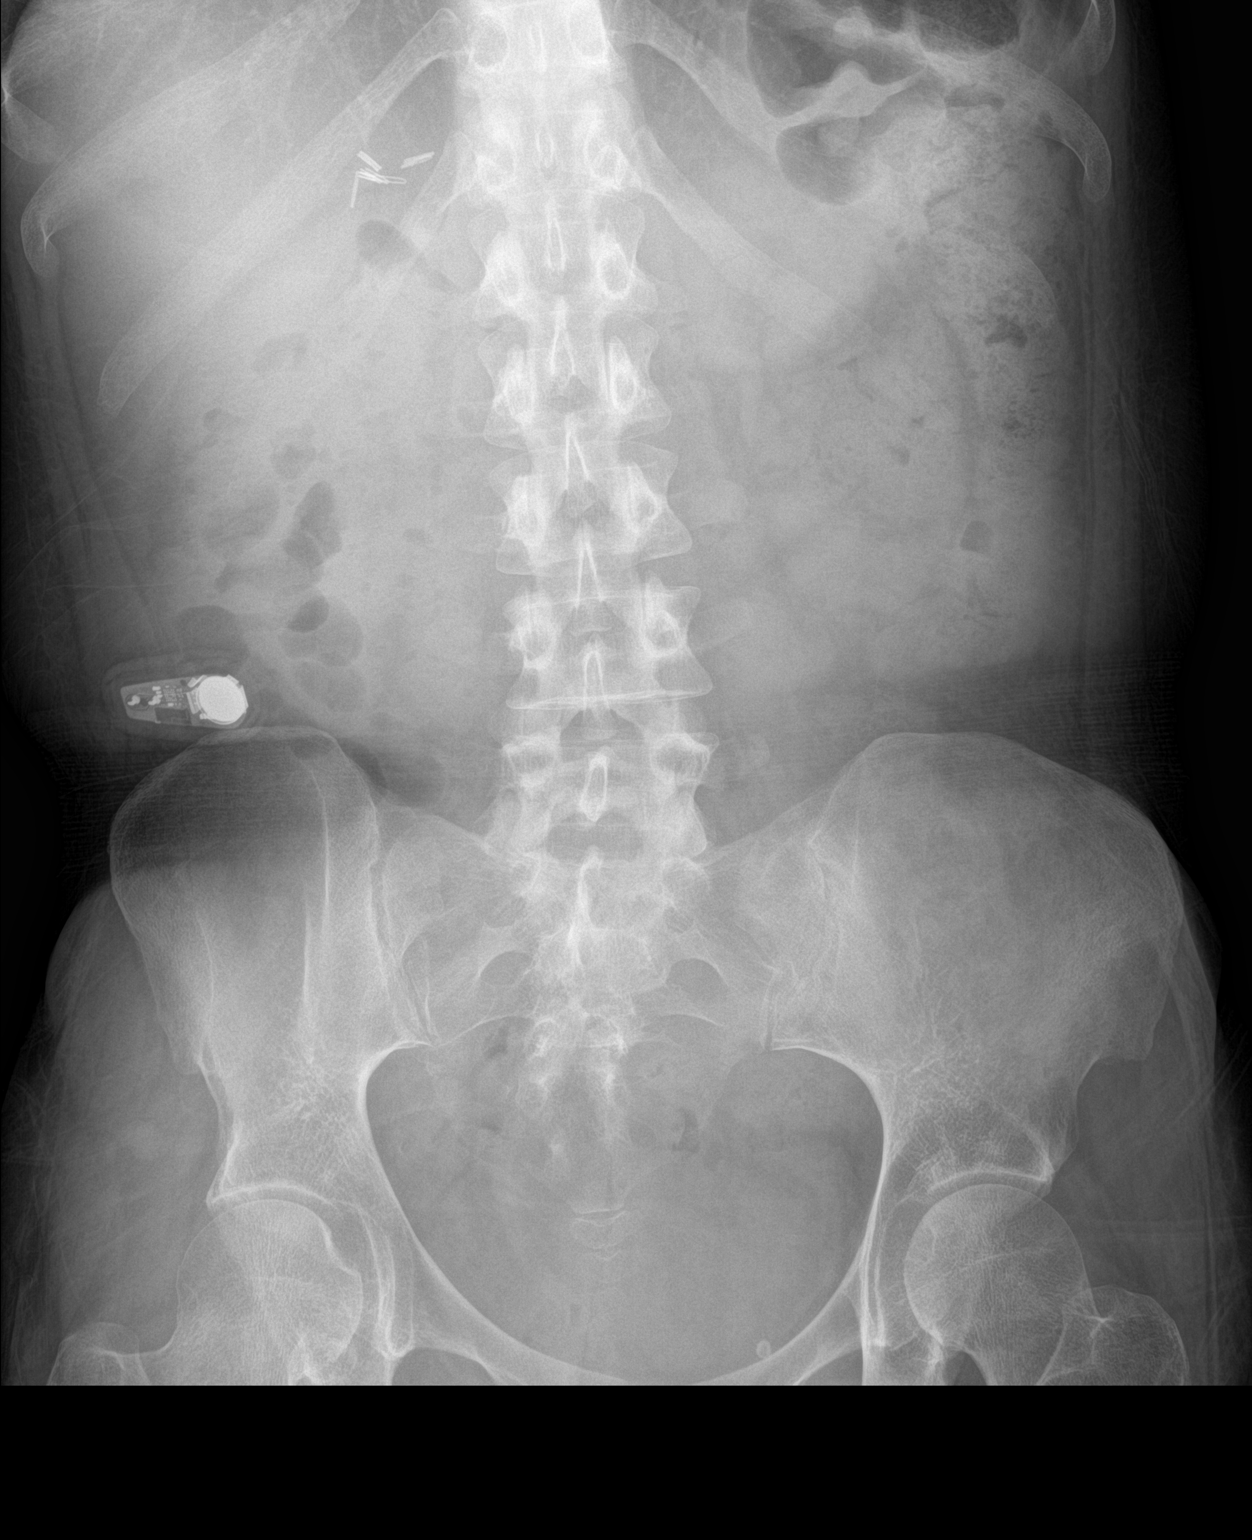

[1 of 1 positions shown; findings below may reference images not displayed]

FINDINGS: Right upper quadrant clips compatible with cholecystectomy. A small
electronic device projects over the right abdomen, possibly the
patient's Dexcom G6 device.

Bowel gas pattern is unremarkable. No significant abnormal
calcifications are identified. A cause for the patient's abdominal
pain is not observed.
IMPRESSION: 1. Unremarkable bowel gas pattern.
2. Prior cholecystectomy.
3. Small electronic device projects over the right abdomen, possibly
the patient's Dexcom G6 device.

## 2020-04-11 MED ORDER — SODIUM CHLORIDE 0.9 % IV SOLN: INTRAVENOUS | Status: DC

## 2020-04-11 MED ORDER — VANCOMYCIN HCL IN DEXTROSE 1-5 GM/200ML-% IV SOLN
1000.0000 mg | Freq: Two times a day (BID) | INTRAVENOUS | Status: DC
  Administered 2020-04-11 – 2020-04-13 (×4): 1000 mg via INTRAVENOUS
  Filled 2020-04-11 (×5): qty 200

## 2020-04-11 MED ORDER — SODIUM CHLORIDE 0.9 % IV SOLN
510.0000 mg | Freq: Once | INTRAVENOUS | Status: AC
  Administered 2020-04-11: 510 mg via INTRAVENOUS
  Filled 2020-04-11: qty 17

## 2020-04-11 MED ORDER — SODIUM CHLORIDE 0.9 % IV SOLN
2.0000 g | Freq: Three times a day (TID) | INTRAVENOUS | Status: DC
  Administered 2020-04-11 – 2020-04-14 (×7): 2 g via INTRAVENOUS
  Filled 2020-04-11 (×9): qty 2

## 2020-04-11 MED ORDER — METOCLOPRAMIDE HCL 5 MG/ML IJ SOLN
10.0000 mg | Freq: Three times a day (TID) | INTRAMUSCULAR | Status: DC
  Administered 2020-04-11 – 2020-04-16 (×12): 10 mg via INTRAVENOUS
  Filled 2020-04-11 (×14): qty 2

## 2020-04-11 MED ORDER — METOCLOPRAMIDE HCL 5 MG/ML IJ SOLN
5.0000 mg | Freq: Three times a day (TID) | INTRAMUSCULAR | Status: DC
  Administered 2020-04-11 (×2): 5 mg via INTRAVENOUS
  Filled 2020-04-11 (×2): qty 2

## 2020-04-11 MED ORDER — LORAZEPAM 2 MG/ML IJ SOLN
1.0000 mg | Freq: Four times a day (QID) | INTRAMUSCULAR | Status: DC | PRN
  Administered 2020-04-11 – 2020-04-12 (×2): 1 mg via INTRAVENOUS
  Filled 2020-04-11 (×2): qty 1

## 2020-04-11 MED ORDER — PROMETHAZINE HCL 25 MG/ML IJ SOLN
12.5000 mg | Freq: Four times a day (QID) | INTRAMUSCULAR | Status: DC | PRN
  Administered 2020-04-11 (×3): 12.5 mg via INTRAVENOUS
  Filled 2020-04-11 (×4): qty 1

## 2020-04-11 MED ORDER — LABETALOL HCL 5 MG/ML IV SOLN
10.0000 mg | INTRAVENOUS | Status: DC | PRN
  Administered 2020-04-11 – 2020-04-12 (×2): 10 mg via INTRAVENOUS
  Filled 2020-04-11 (×2): qty 4

## 2020-04-11 NOTE — Progress Notes (Signed)
Inpatient Diabetes Program Recommendations  AACE/ADA: New Consensus Statement on Inpatient Glycemic Control (2015)  Target Ranges:  Prepandial:   less than 140 mg/dL      Peak postprandial:   less than 180 mg/dL (1-2 hours)      Critically ill patients:  140 - 180 mg/dL   Lab Results  Component Value Date   GLUCAP 76 04/11/2020   HGBA1C 8.6 (H) 04/10/2020    Review of Glycemic Control Results for DELVA, DERDEN (MRN [id number]) as of 04/11/2020 11:22  Ref. Range 04/10/2020 11:42 04/10/2020 15:22 04/10/2020 15:54 04/10/2020 16:07 04/10/2020 16:29 04/10/2020 17:56 04/10/2020 19:47 04/11/2020 01:32 04/11/2020 07:53  Glucose-Capillary Latest Ref Range: 70 - 99 mg/dL 308 (H) 60 (L) 50 (L) 49 (L) 127 (H) 125 (H) 76 113 (H) 76  Outpatient Diabetes medications: NPH 20 units q AM Current orders for Inpatient glycemic control: Novolog very sensitive tid with meals  Inpatient Diabetes Program Recommendations:    Note hypoglycemia with NPH yesterday.  At d/c, do not recommend restart of NPH regimen due to hypoglycemia risk.   Consider restarting basal insulin Lantus 6 units daily?   Thanks  Beryl Meager, RN, BC-ADM Inpatient Diabetes Coordinator Pager [phone] (8a-5p)

## 2020-04-11 NOTE — Progress Notes (Signed)
ABI w/ TBI study completed.  Preliminary results discussed with [place], Therapist, sports.   See CV Proc for preliminary results report.   Darlin Coco, RDMS

## 2020-04-11 NOTE — Progress Notes (Signed)
LOW BLOOD SUGAR EVENT  Pt indicated blood sugar to be 40, as per personal monitor. 12.5g Dextrose given.

## 2020-04-11 NOTE — Progress Notes (Addendum)
PROGRESS NOTE    [name]  ZYS:[id number] DOB: 03/27/1991 DOA: 04/09/2020 PCP: [name], MD    Brief Narrative:  [name] is a 30 year old female with past medical history significant for type 1 diabetes mellitus, essential hypertension, CKD stage IIIa, depression/anxiety who presented to the ED with progressive right foot pain, swelling and discharge.  Patient reports onset roughly 1 week ago when patient's callus on right foot plantar aspect started having a small ulcer now with purulent discharge.  Patient also reports subjective fevers and chills.  In the ED, temperature 99.6, HR 70, RR 19, BP 148/93, SPO2 97% on room air.  Sodium 131, potassium 4.7, chloride 98, CO2 25, glucose 94, BUN 23, creatinine 1.28, lactic acid 0.6.  WBC 7.3, hemoglobin 7.1, MCV 87.5, platelets 239.  Blood cultures x2 ordered.  Chest x-ray with no acute cardiopulmonary disease process.  Right foot x-ray with soft tissue swelling with underlying soft tissue gas along the medial aspect of the first metatarsal head, no findings to suggest acute osteomyelitis.    Recent COVID-19 PCR positive 04/05/2020. + exposure Hx with husband positive with COVID.  Patient is vaccinated against Covid-19 with [place], last dose 07/28/2019.  Hospital service consulted for further evaluation and management of diabetic foot ulcer.   Assessment & Plan:   Principal Problem:   Diabetic foot ulcer ([place]) Active Problems:   Diabetes mellitus type 1 (HCC)   Protein-calorie malnutrition, severe (HCC)   Normocytic anemia   Hypertensive urgency   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 (HCC)   Wound infection   COVID-19 virus infection   Cellulitis and abscess of foot   Osteomyelitis (HCC)   Right foot cellulitis/osteomyelitis Diabetic foot ulcer Patient presenting to the ED with progressive swelling, pain and discharge from right great toe.  Onset 1 week ago.  Associated with fever/chills.  Patient with low-grade  temperature of 99.6 on arrival.  WBC count within normal limits, lactic acid 0.6.  Elevated CRP of 12.6.  X-ray with soft tissue swelling with soft tissue gas along the medial aspect of first metatarsal head, no findings suggestive of acute osteomyelitis.  MR right foot without contrast with soft tissue ulceration lateral aspect forefoot at level of first metatarsal head with adjacent cellulitis, no fluid collection/abscess; but notable patchy T1 marrow signal adjacent to the soft tissue infection consistent with acute osteomyelitis. --Orthopedics following, appreciate assistance --Blood culture x 2: no growth x 2 days --Pending ABI; if positive will likely need vascular surgery evaluation --Continue empiric antibiotics with vancomycin and cefepime -- Dressing changes daily; Santyl with saline moistened gauze covered by ABD pad/dry gauze  Anemia, iron deficiency Anemia panel with iron 8, TIBC 195, ferritin 128, folate 34.4.  Transfuse 1 unit PRBC on 04/10/2020. --Hemoglobin 7.1>6.3>8.7>8.3 --IV Feraheme x1 today -- CBC daily  Acute Covid-19 viral infection during the ongoing Covid 19 Pandemic - POA Patient with positive COVID-19 PCR on 04/05/2020.  Positive exposure with husband.  Patient vaccinated against Covid-19 with [place], last dose reported 07/28/2019 --CRP 12.6>17.6 --ddimer 1.06>3.31 --Remdesivir, plan 3-day course (Day #2/3) --Continue supplemental oxygen, titrate to maintain SPO2 greater than 92%, currently oxygenating well on room air --Continue supportive care with albuterol MDI prn, vitamin C, zinc, Tylenol, antitussives (benzonatate/ Mucinex/Tussionex) --Follow CBC, CMP, D-dimer, and CRP daily --Continue airborne/contact isolation precautions for 10 days from the day of diagnosis  The treatment plan and use of medications and known side effects were discussed with patient/family. Some of the  medications used are based on case reports/anecdotal data.  All other medications being used  in the management of COVID-19 based on limited study data.  Complete risks and long-term side effects are unknown, however in the best clinical judgment they seem to be of some benefit.  Patient wanted to proceed with treatment options provided.  Hyponatremia Sodium 131 on admission, suspect volume depletion with decreased oral intake prior to presentation. --Na 131>134 --continue IVF w/ D5NS at 33m/hr  --follow BMP daily  Nausea Abdominal Pain KUB 04/11/2020 with unremarkable bowel gas pattern, prior cholecystectomy with no acute findings.  Back to etiology from underlying anxiety due to her uncertainty of treatment in regards to her foot infection. --continue IVF hydration --Reglan 5 mg IV every 8 hours --Phenergan 12.5 mg IV q6h prn refractory nausea/vomiting -- Supportive care  CKD stage IIIa Baseline GFR 58-59. --Avoid nephrotoxins, renal dose all medications --Monitor renal function closely daily  Anxiety Suspect symptoms of anxiety related to her abdominal discomfort and nausea, given uncertainty about what will be required for treatment of her active foot infection. -- Lorazepam 1 mg IV every 6 hours as needed anxiety  Type 1 diabetes mellitus Hemoglobin A1c 8.6 on 04/10/2020, poorly controlled.  Home regimen includes NPH 20 units every morning.  --Diabetic educator following; appreciate assistance --Holding basal insulin currently given hypoglycemia and erratic oral intake --SSI for further coverage --CBGs qAC/HS  Essential hypertension -- Carvedilol 6.25 mg p.o. twice daily -- Hydralazine 50 mg PO q6h -- Isosorbide mononitrate 30 mg p.o. daily  GERD: Continue Protonix 40 mg p.o. twice daily and Carafate 1 g p.o. q6h prn  Depression/anxiety:  --BuSpar 5 mg p.o. 3 times daily --Lorazepam 1 mg IV q6h prn  Insomnia: Mirtazapine 30 mg p.o. nightly   DVT prophylaxis: Heparin Code Status: Full code Family Communication: Updated patient extensively at  bedside  Disposition Plan:  Status is: Inpatient  Remains inpatient appropriate because:Ongoing active pain requiring inpatient pain management, Ongoing diagnostic testing needed not appropriate for outpatient work up, Unsafe d/c plan, IV treatments appropriate due to intensity of illness or inability to take PO and Inpatient level of care appropriate due to severity of illness   Dispo: The patient is from: Home              Anticipated d/c is to: Home              Anticipated d/c date is: 3 days              Patient currently is not medically stable to d/c.   Consultants:  Orthopedics  Procedures:  None  Antimicrobials:  Zosyn 1/10 - 1/10 Vancomycin 1/10>> Cefepime 1/11>> Remdesivir 1/11>>    Subjective: Patient seen and examined at bedside, lying in bed.  Nursing present.  Actively crying, complaining of nausea and abdominal discomfort.  Highly anxious.  Concerned about her foot.  Discussed with her findings of bone infection and impending ABIs to assess blood flow; which has yet to be completed although ordered yesterday at 10:30 AM.   No other complaints or concerns at this time.  Currently denies headache, no fever/chills/night sweats, no nausea/vomiting/diarrhea, no chest pain, no palpitations, no shortness of breath, no abdominal pain, no weakness, no fatigue, no paresthesias.  No acute events overnight per nursing staff.  Objective: Vitals:   04/10/20 1557 04/10/20 1648 04/10/20 1949 04/11/20 0756  BP: 110/70 110/71 122/77 (!) 167/90  Pulse: 82 78 90 (!) 107  Resp: 16 14 16  17  Temp: 98.4 F (36.9 C) 98.2 F (36.8 C) (!) 97.5 F (36.4 C) 99.3 F (37.4 C)  TempSrc: Oral Oral Oral Oral  SpO2: 100% 100% 100% 100%  Weight:      Height:        Intake/Output Summary (Last 24 hours) at 04/11/2020 1220 Last data filed at 04/11/2020 0500 Gross per 24 hour  Intake 1323.4 ml  Output --  Net 1323.4 ml   Filed Weights   04/09/20 1246  Weight: 60 kg     Examination:  General exam: Mild distress, crying, complaining of abdominal pain Respiratory system: Clear to auscultation. Respiratory effort normal. Cardiovascular system: S1 & S2 heard, RRR. No JVD, murmurs, rubs, gallops or clicks. No pedal edema. Gastrointestinal system: Abdomen is nondistended, soft, mild diffuse TTP, No organomegaly or masses felt. Normal bowel sounds heard. Central nervous system: Alert and oriented. No focal neurological deficits. Extremities: Symmetric 5 x 5 power. Skin: Ulcer noted to lateral and plantar surface right great toe, malodorous with purulence Psychiatry: Judgement and insight appear normal. Mood & affect appropriate.           Data Reviewed: I have personally reviewed following labs and imaging studies  CBC: Recent Labs  Lab 04/09/20 1522 04/10/20 0714 04/10/20 1828 04/11/20 0249  WBC 7.3 8.1  --  10.1  NEUTROABS 5.1  --   --  8.4*  HGB 7.1* 6.3* 8.7* 8.3*  HCT 22.5* 19.8* 27.5* 24.5*  MCV 87.5 86.1  --  85.4  PLT 239 242  --  166   Basic Metabolic Panel: Recent Labs  Lab 04/09/20 1522 04/10/20 0714 04/11/20 0249  NA 131*  --  134*  K 4.7  --  4.2  CL 98  --  100  CO2 25  --  23  GLUCOSE 94  --  87  BUN 23*  --  19  CREATININE 1.28* 1.25* 1.19*  CALCIUM 8.4*  --  8.5*   GFR: Estimated Creatinine Clearance: 65.5 mL/min (A) (by C-G formula based on SCr of 1.19 mg/dL (H)). Liver Function Tests: Recent Labs  Lab 04/09/20 1522 04/11/20 0249  AST 15 16  ALT 12 11  ALKPHOS 75 78  BILITOT 0.6 0.2*  PROT 7.8 7.7  ALBUMIN 2.4* 2.1*   No results for input(s): LIPASE, AMYLASE in the last 168 hours. No results for input(s): AMMONIA in the last 168 hours. Coagulation Profile: No results for input(s): INR, PROTIME in the last 168 hours. Cardiac Enzymes: No results for input(s): CKTOTAL, CKMB, CKMBINDEX, TROPONINI in the last 168 hours. BNP (last 3 results) No results for input(s): PROBNP in the last 8760  hours. HbA1C: Recent Labs    04/10/20 1122  HGBA1C 8.6*   CBG: Recent Labs  Lab 04/10/20 1756 04/10/20 1947 04/11/20 0132 04/11/20 0753 04/11/20 1129  GLUCAP 125* 76 113* 76 114*   Lipid Profile: No results for input(s): CHOL, HDL, LDLCALC, TRIG, CHOLHDL, LDLDIRECT in the last 72 hours. Thyroid Function Tests: No results for input(s): TSH, T4TOTAL, FREET4, T3FREE, THYROIDAB in the last 72 hours. Anemia Panel: Recent Labs    04/10/20 1122  VITAMINB12 366  FOLATE 34.4  FERRITIN 128  TIBC 195*  IRON 8*  RETICCTPCT 0.9   Sepsis Labs: Recent Labs  Lab 04/09/20 1522 04/09/20 1711  LATICACIDVEN 0.6 0.5    Recent Results (from the past 240 hour(s))  SARS CORONAVIRUS 2 (TAT 6-24 HRS) Nasopharyngeal Nasopharyngeal Swab     Status: Abnormal   Collection Time: 04/05/20 11:05  PM   Specimen: Nasopharyngeal Swab  Result Value Ref Range Status   SARS Coronavirus 2 POSITIVE (A) NEGATIVE Final    Comment: (NOTE) SARS-CoV-2 target nucleic acids are DETECTED.  The SARS-CoV-2 RNA is generally detectable in upper and lower respiratory specimens during the acute phase of infection. Positive results are indicative of the presence of SARS-CoV-2 RNA. Clinical correlation with patient history and other diagnostic information is  necessary to determine patient infection status. Positive results do not rule out bacterial infection or co-infection with other viruses.  The expected result is Negative.  Fact Sheet for Patients: SugarRoll.be  Fact Sheet for Healthcare Providers: https://www.woods-mathews.com/  This test is not yet approved or cleared by the [place] FDA and  has been authorized for detection and/or diagnosis of SARS-CoV-2 by FDA under an Emergency Use Authorization (EUA). This EUA will remain  in effect (meaning this test can be used) for the duration of the COVID-19 declaration under Section 564(b)(1) of the Act, 21 U.  S.C. section 360bbb-3(b)(1), unless the authorization is terminated or revoked sooner.   Performed at [facility], [place] [address], [place], [place] [address]   Culture, blood (routine x 2)     Status: None (Preliminary result)   Collection Time: 04/09/20  3:27 PM   Specimen: BLOOD  Result Value Ref Range Status   Specimen Description   Final    BLOOD LEFT ANTECUBITAL Performed at [facility], [place] [address], [place], [place] [address]    Special Requests   Final    BOTTLES DRAWN AEROBIC AND ANAEROBIC Blood Culture adequate volume Performed at [facility], [address] [place], [place] [address]    Culture   Final    NO GROWTH 2 DAYS Performed at [facility], [place] [address], [place], [place] [address]    Report Status PENDING  Incomplete  Culture, blood (routine x 2)     Status: None (Preliminary result)   Collection Time: 04/09/20  4:07 PM   Specimen: BLOOD  Result Value Ref Range Status   Specimen Description   Final    BLOOD RIGHT ANTECUBITAL Performed at [facility], [address] [place], [place] [address]    Special Requests   Final    BOTTLES DRAWN AEROBIC AND ANAEROBIC Blood Culture adequate volume Performed at [name], [address] [place], [place] [address]    Culture   Final    NO GROWTH 2 DAYS Performed at [facility], [place] [address] [place], [place] [address]    Report Status PENDING  Incomplete         Radiology Studies: MR FOOT RIGHT WO CONTRAST  Result Date: 04/10/2020 CLINICAL DATA:  Diabetic foot ulcer adjacent to the first MTP joint. A EXAM: MRI OF THE RIGHT FOREFOOT WITHOUT CONTRAST TECHNIQUE: Multiplanar, multisequence MR imaging of the right forefoot was performed. No intravenous contrast was administered. COMPARISON:  X-ray 04/09/2020 FINDINGS: Bones/Joint/Cartilage No acute fracture. No malalignment. There is subtle marrow edema within the medial hallux sesamoid  (series 4, image 22) with patchy low T1 marrow signal (series 3, image 22). Elsewhere, no additional sites of bone marrow edema within the right forefoot. No bony erosion or periostitis. Small first MTP joint effusion. Ligaments Intact Lisfranc ligament. Collateral ligaments of the forefoot intact. Muscles and Tendons Mild atrophy of the intrinsic foot musculature suggesting chronic denervation changes. Intact flexor and extensor tendons. Trace tenosynovial fluid  within the flexor hallucis longus tendon sheath just proximal to the first MTP joint. Soft tissues Soft tissue ulceration overlying the lateral aspect of the forefoot at the level of the first metatarsal head. There is mild soft tissue edema surrounding. No organized fluid collection. There is nonspecific subcutaneous edema throughout the dorsum of the forefoot. IMPRESSION: 1. Soft tissue ulceration overlying the lateral aspect of the forefoot at the level of the first metatarsal head with adjacent cellulitis. No organized fluid collection/abscess. 2. Subtle marrow edema within the medial hallux sesamoid with patchy low T1 marrow signal. Findings are favored to represent acute osteomyelitis given the proximity of adjacent soft tissue infection. Sequela of chronic sesamoiditis could have a similar appearance, although is felt to be less likely. 3. Small first MTP joint effusion, which may be reactive or reflect septic arthritis. 4. Trace tenosynovial fluid within the flexor hallucis longus tendon sheath just proximal to the first MTP joint, suggesting a mild tenosynovitis which could be reactive or infectious. Electronically Signed   By: Davina Poke D.O.   On: 04/10/2020 09:46   DG Chest Portable 1 View  Result Date: 04/09/2020 CLINICAL DATA:  COVID positive with wound infection of the foot. EXAM: PORTABLE CHEST 1 VIEW COMPARISON:  Multiple priors including CTA chest abdomen pelvis March 20, 2020 and chest radiograph January 19, 2020. FINDINGS:  The heart size and mediastinal contours are within normal limits. Both lungs are clear. The visualized skeletal structures are unremarkable. IMPRESSION: No active disease. Electronically Signed   By: Dahlia Bailiff MD   On: 04/09/2020 17:16   DG Abd Portable 1V  Result Date: 04/11/2020 CLINICAL DATA:  Abdominal pain low back pain. Recent diagnosis COVID infection. EXAM: PORTABLE ABDOMEN - 1 VIEW COMPARISON:  05/28/2019 FINDINGS: Right upper quadrant clips compatible with cholecystectomy. A small electronic device projects over the right abdomen, possibly the patient's Dexcom G6 device. Bowel gas pattern is unremarkable. No significant abnormal calcifications are identified. A cause for the patient's abdominal pain is not observed. IMPRESSION: 1. Unremarkable bowel gas pattern. 2. Prior cholecystectomy. 3. Small electronic device projects over the right abdomen, possibly the patient's Dexcom G6 device. Electronically Signed   By: Van Clines M.D.   On: 04/11/2020 09:12   DG Foot Complete Right  Result Date: 04/09/2020 CLINICAL DATA:  Wound to right great toe EXAM: RIGHT FOOT COMPLETE - 3+ VIEW COMPARISON:  None. FINDINGS: Soft tissue swelling with underlying soft tissue gas along the medial aspect of the 1st metatarsal head. No underlying cortical irregularity. No fracture or dislocation is seen. The joint spaces are preserved. Postsurgical changes involving the distal tibia and fibula. IMPRESSION: Soft tissue swelling with underlying soft tissue gas along the medial aspect of the 1st metatarsal head. No radiographic findings to suggests acute osteomyelitis. Electronically Signed   By: Julian Hy M.D.   On: 04/09/2020 15:37        Scheduled Meds:  sodium chloride   Intravenous Once   vitamin C  500 mg Oral Daily   busPIRone  5 mg Oral TID   carvedilol  6.25 mg Oral BID WC   collagenase   Topical Daily   heparin  5,000 Units Subcutaneous Q8H   hydrALAZINE  50 mg Oral Q6H   insulin  aspart  0-6 Units Subcutaneous TID WC   isosorbide mononitrate  30 mg Oral Daily   metoCLOPramide (REGLAN) injection  5 mg Intravenous Q8H   mirtazapine  30 mg Oral QHS   pantoprazole  40 mg  Oral BID   scopolamine  1 patch Transdermal Q72H   zinc sulfate  220 mg Oral Daily   Continuous Infusions:  sodium chloride Stopped (04/09/20 1818)   ceFEPime (MAXIPIME) IV     dextrose 5 % and 0.9% NaCl 75 mL/hr at 04/10/20 1640   remdesivir 100 mg in NS 100 mL 100 mg (04/11/20 0828)   vancomycin 1,000 mg (04/11/20 1210)     LOS: 1 day    Time spent: 41 minutes spent on chart review, discussion with nursing staff, consultants, updating family and interview/physical exam; more than 50% of that time was spent in counseling and/or coordination of care.    Shanvi Moyd J [place] DO [facility] Available via Epic secure chat 7am-7pm After these hours, please refer to coverage provider listed on [email or web address] 04/11/2020, 12:20 PM

## 2020-04-11 NOTE — Progress Notes (Signed)
   04/11/20 1559  Vitals  Temp 98.1 F (36.7 C)  Temp Source Oral  BP (!) 169/98  MAP (mmHg) 118  BP Location Right Arm  BP Method Automatic  Patient Position (if appropriate) Lying  Pulse Rate 100  Pulse Rate Source Dinamap  Resp 16  Level of Consciousness  Level of Consciousness Alert  MEWS COLOR  MEWS Score Color Green  Oxygen Therapy  SpO2 100 %  O2 Device Room Air  MEWS Score  MEWS Temp 0  MEWS Systolic 0  MEWS Pulse 0  MEWS RR 0  MEWS LOC 0  MEWS Score 0   Pt BP elevated 169/98. Pt did not take her oral meds for me d/t N/V. IV hydralazine was given. BP came down to 164/89 15 minutes later. Dr. [place] was notified.

## 2020-04-11 NOTE — Progress Notes (Signed)
Pharmacy Antibiotic Note  [name] is a 30 y.o. female admitted on 04/09/2020 with cellulitis with diabetic foot ulcer.  Pharmacy has been consulted for vancomycin and cefepime dosing.  MRI showed likely osteo. Plan for [facility] consult. Possible ray amputation per ortho. Her renal function improved a little so we will adjust vanc/cefepime today.   Scr down 1.19 CrCl~65 ml/min Plan: Change vanc to 1g IV q12 AUC 515, scr 1.19 Change cefepime 2g IV q8  Height: 5' 7"  (170.2 cm) Weight: 60 kg (132 lb 3.3 oz) IBW/kg (Calculated) : 61.6  Temp (24hrs), Avg:98.3 F (36.8 C), Min:97.5 F (36.4 C), Max:99.3 F (37.4 C)  Recent Labs  Lab 04/09/20 1522 04/09/20 1711 04/10/20 0714 04/11/20 0249  WBC 7.3  --  8.1 10.1  CREATININE 1.28*  --  1.25* 1.19*  LATICACIDVEN 0.6 0.5  --   --     Estimated Creatinine Clearance: 65.5 mL/min (A) (by C-G formula based on SCr of 1.19 mg/dL (H)).    Allergies  Allergen Reactions  . Other Anaphylaxis    Reaction to [place] nuts   . Lactose Intolerance (Gi) Diarrhea   Vanc 1/10 >> Zosyn x1 1/10 Cefepime 1/11 >>  Remdesivir 1/11 >> 1/13  1/10 BCx - NGTD  Onnie Boer, PharmD, BCIDP, AAHIVP, CPP Infectious Disease Pharmacist 04/11/2020 10:10 AM

## 2020-04-12 ENCOUNTER — Other Ambulatory Visit: Payer: Self-pay | Admitting: Physician Assistant

## 2020-04-12 DIAGNOSIS — E611 Iron deficiency: Secondary | ICD-10-CM

## 2020-04-12 LAB — COMPREHENSIVE METABOLIC PANEL
ALT: 11 U/L (ref 0–44)
AST: 15 U/L (ref 15–41)
Albumin: 2 g/dL — ABNORMAL LOW (ref 3.5–5.0)
Alkaline Phosphatase: 76 U/L (ref 38–126)
Anion gap: 12 (ref 5–15)
BUN: 18 mg/dL (ref 6–20)
CO2: 20 mmol/L — ABNORMAL LOW (ref 22–32)
Calcium: 8.7 mg/dL — ABNORMAL LOW (ref 8.9–10.3)
Chloride: 101 mmol/L (ref 98–111)
Creatinine, Ser: 1.11 mg/dL — ABNORMAL HIGH (ref 0.44–1.00)
GFR, Estimated: >60 mL/min (ref >60)
Glucose, Bld: 240 mg/dL — ABNORMAL HIGH (ref 70–99)
Potassium: 3.8 mmol/L (ref 3.5–5.1)
Sodium: 133 mmol/L — ABNORMAL LOW (ref 135–145)
Total Bilirubin: 0.8 mg/dL (ref 0.3–1.2)
Total Protein: 8.1 g/dL (ref 6.5–8.1)

## 2020-04-12 LAB — GLUCOSE, CAPILLARY
Glucose-Capillary: 227 mg/dL — ABNORMAL HIGH (ref 70–99)
Glucose-Capillary: 219 mg/dL — ABNORMAL HIGH (ref 70–99)
Glucose-Capillary: 137 mg/dL — ABNORMAL HIGH (ref 70–99)

## 2020-04-12 LAB — CBC WITH DIFFERENTIAL/PLATELET
Abs Immature Granulocytes: 0.06 10*3/uL (ref 0.00–0.07)
Basophils Absolute: 0 10*3/uL (ref 0.0–0.1)
Basophils Relative: 0 %
Eosinophils Absolute: 0 10*3/uL (ref 0.0–0.5)
Eosinophils Relative: 0 %
HCT: 28.4 % — ABNORMAL LOW (ref 36.0–46.0)
Hemoglobin: 9 g/dL — ABNORMAL LOW (ref 12.0–15.0)
Immature Granulocytes: 1 %
Lymphocytes Relative: 16 %
Lymphs Abs: 1.4 10*3/uL (ref 0.7–4.0)
MCH: 27.7 pg (ref 26.0–34.0)
MCHC: 31.7 g/dL (ref 30.0–36.0)
MCV: 87.4 fL (ref 80.0–100.0)
Monocytes Absolute: 0.6 10*3/uL (ref 0.1–1.0)
Monocytes Relative: 6 %
Neutro Abs: 6.7 10*3/uL (ref 1.7–7.7)
Neutrophils Relative %: 77 %
Platelets: 307 10*3/uL (ref 150–400)
RBC: 3.25 MIL/uL — ABNORMAL LOW (ref 3.87–5.11)
RDW: 12.8 % (ref 11.5–15.5)
WBC: 8.8 10*3/uL (ref 4.0–10.5)
nRBC: 0 % (ref 0.0–0.2)

## 2020-04-12 LAB — D-DIMER, QUANTITATIVE: D-Dimer, Quant: 2.67 ug/mL-FEU — ABNORMAL HIGH (ref 0.00–0.50)

## 2020-04-12 LAB — C-REACTIVE PROTEIN: CRP: 17.2 mg/dL — ABNORMAL HIGH (ref <1.0)

## 2020-04-12 MED ORDER — INSULIN GLARGINE 100 UNIT/ML ~~LOC~~ SOLN
6.0000 [IU] | Freq: Every day | SUBCUTANEOUS | Status: DC
  Administered 2020-04-12 – 2020-04-16 (×4): 6 [IU] via SUBCUTANEOUS
  Filled 2020-04-12 (×5): qty 0.06

## 2020-04-12 MED ORDER — CEFAZOLIN SODIUM-DEXTROSE 2-4 GM/100ML-% IV SOLN
2.0000 g | INTRAVENOUS | Status: AC
  Administered 2020-04-13: 2 g via INTRAVENOUS

## 2020-04-12 NOTE — Progress Notes (Signed)
PROGRESS NOTE    [name]  FHQ:[id number] DOB: 05/08/1990 DOA: 04/09/2020 PCP: [name], MD    Brief Narrative:  [name] is a 30 year old female with past medical history significant for type 1 diabetes mellitus, essential hypertension, CKD stage IIIa, depression/anxiety who presented to the ED with progressive right foot pain, swelling and discharge.  Patient reports onset roughly 1 week ago when patient's callus on right foot plantar aspect started having a small ulcer now with purulent discharge.  Patient also reports subjective fevers and chills.  In the ED, temperature 99.6, HR 70, RR 19, BP 148/93, SPO2 97% on room air.  Sodium 131, potassium 4.7, chloride 98, CO2 25, glucose 94, BUN 23, creatinine 1.28, lactic acid 0.6.  WBC 7.3, hemoglobin 7.1, MCV 87.5, platelets 239.  Blood cultures x2 ordered.  Chest x-ray with no acute cardiopulmonary disease process.  Right foot x-ray with soft tissue swelling with underlying soft tissue gas along the medial aspect of the first metatarsal head, no findings to suggest acute osteomyelitis.    Recent COVID-19 PCR positive 04/05/2020. + exposure Hx with husband positive with COVID.  Patient is vaccinated against Covid-19 with [place], last dose 07/28/2019.  Hospital service consulted for further evaluation and management of diabetic foot ulcer.   Assessment & Plan:   Principal Problem:   Diabetic foot ulcer ([place]) Active Problems:   Diabetes mellitus type 1 (HCC)   Protein-calorie malnutrition, severe (HCC)   Normocytic anemia   Hypertensive urgency   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 (HCC)   Wound infection   COVID-19 virus infection   Cellulitis and abscess of foot   Osteomyelitis (HCC)   Iron deficiency   Right foot cellulitis/osteomyelitis Diabetic foot ulcer Patient presenting to the ED with progressive swelling, pain and discharge from right great toe.  Onset 1 week ago.  Associated with fever/chills.   Patient with low-grade temperature of 99.6 on arrival.  WBC count within normal limits, lactic acid 0.6.  Elevated CRP of 12.6.  X-ray with soft tissue swelling with soft tissue gas along the medial aspect of first metatarsal head, no findings suggestive of acute osteomyelitis.  MR right foot without contrast with soft tissue ulceration lateral aspect forefoot at level of first metatarsal head with adjacent cellulitis, no fluid collection/abscess; but notable patchy T1 marrow signal adjacent to the soft tissue infection consistent with acute osteomyelitis.  ABIs inconclusive due to arterial calcification, but per Ortho has good inline flow. --Orthopedics following, appreciate assistance --Blood culture x 2: no growth x 3 days --Continue empiric antibiotics with vancomycin and cefepime --Dressing changes daily; Santyl with saline moistened gauze covered by ABD pad/dry gauze --Dr [name] plans for first ray amputation right foot on 04/13/2020 -- N.p.o. after midnight  Anemia, iron deficiency Anemia panel with iron 8, TIBC 195, ferritin 128, folate 34.4.  Transfuse 1 unit PRBC on 04/10/2020 and IV Feraheme on 04/11/2020 --Hemoglobin 7.1>6.3>8.7>8.3>9.0 --CBC daily  Acute Covid-19 viral infection during the ongoing Covid 19 Pandemic - POA Patient with positive COVID-19 PCR on 04/05/2020.  Positive exposure with husband.  Patient vaccinated against Covid-19 with [place], last dose reported 07/28/2019 --CRP 12.6>17.6>17.2 --ddimer 1.06>3.31>2.67 --Remdesivir, plan 3-day course (Day #3/3) --Continue supplemental oxygen, titrate to maintain SPO2 greater than 92%, currently oxygenating well on room air --Continue supportive care with albuterol MDI prn, vitamin C, zinc, Tylenol, antitussives (benzonatate/ Mucinex/Tussionex) --Follow CBC, CMP, D-dimer, and CRP daily --Continue airborne/contact isolation precautions for 10 days from the  day of diagnosis  The treatment plan and use of medications and known side  effects were discussed with patient/family. Some of the medications used are based on case reports/anecdotal data.  All other medications being used in the management of COVID-19 based on limited study data.  Complete risks and long-term side effects are unknown, however in the best clinical judgment they seem to be of some benefit.  Patient wanted to proceed with treatment options provided.  Hyponatremia Sodium 131 on admission, suspect volume depletion with decreased oral intake prior to presentation. --Na 696>295>284 --Discontinue IV fluids today --follow BMP daily  Nausea Abdominal Pain KUB 04/11/2020 with unremarkable bowel gas pattern, prior cholecystectomy with no acute findings.  Back to etiology from underlying anxiety due to her uncertainty of treatment in regards to her foot infection.  Patient was also noted by nursing staff to be self inducing vomiting by sticking her finger down her throat. --Reglan 10 mg IV every 8 hours --Phenergan 12.5 mg IV q6h prn refractory nausea/vomiting --Supportive care --Nursing to observe all medication administration  CKD stage IIIa Baseline GFR 58-59. --Avoid nephrotoxins, renal dose all medications --Monitor renal function closely daily  Anxiety Suspect symptoms of anxiety related to her abdominal discomfort and nausea, given uncertainty about what will be required for treatment of her active foot infection. --Lorazepam 1 mg IV every 6 hours as needed anxiety  Type 1 diabetes mellitus Hemoglobin A1c 8.6 on 04/10/2020, poorly controlled.  Home regimen includes NPH 20 units every morning.  --Diabetic educator following; appreciate assistance --Lantus 6 unit subcutaneously daily --SSI for further coverage --CBGs qAC/HS  Essential hypertension --Carvedilol 6.25 mg p.o. twice daily --Hydralazine 50 mg PO q6h --Isosorbide mononitrate 30 mg p.o. daily  GERD: Continue Protonix 40 mg p.o. twice daily and Carafate 1 g p.o. q6h  prn  Depression/anxiety:  --BuSpar 5 mg p.o. 3 times daily --Lorazepam 1 mg IV q6h prn  Insomnia: Mirtazapine 30 mg p.o. nightly   DVT prophylaxis: Heparin Code Status: Full code Family Communication: Updated patient extensively at bedside  Disposition Plan:  Status is: Inpatient  Remains inpatient appropriate because:Ongoing active pain requiring inpatient pain management, Ongoing diagnostic testing needed not appropriate for outpatient work up, Unsafe d/c plan, IV treatments appropriate due to intensity of illness or inability to take PO and Inpatient level of care appropriate due to severity of illness   Dispo: The patient is from: Home              Anticipated d/c is to: Home              Anticipated d/c date is: 2 days              Patient currently is not medically stable to d/c.   Consultants:   Orthopedics, Dr. [name]  Procedures:   None  Antimicrobials:   Zosyn 1/10 - 1/10  Vancomycin 1/10>>  Cefepime 1/11>>  Remdesivir 1/11>>    Subjective: Patient seen and examined at bedside, lying in bed; appears comfortable scrolling through her phone.  Seen by orthopedics this morning with plan surgical intervention for her osteomyelitis tomorrow morning.  Patient was noted to be self inducing vomiting episodes last night by nursing; as patient was sticking her finger down her throat.  Patient with no other complaints or concerns at this time.  Currently denies headache, no fever/chills/night sweats, no nausea/vomiting/diarrhea, no chest pain, no palpitations, no shortness of breath, no abdominal pain, no weakness, no fatigue, no paresthesias.  No other  acute events overnight per nursing staff.  Objective: Vitals:   04/11/20 1956 04/12/20 0514 04/12/20 0650 04/12/20 0929  BP: (!) 145/82 (!) 187/100 136/78 (!) 176/98  Pulse: 98 (!) 104 (!) 106 (!) 104  Resp: 15 16  18   Temp: 98.6 F (37 C) 98.6 F (37 C)  98.8 F (37.1 C)  TempSrc: Oral Oral  Oral  SpO2: 100%  100%  100%  Weight:      Height:        Intake/Output Summary (Last 24 hours) at 04/12/2020 1033 Last data filed at 04/12/2020 0700 Gross per 24 hour  Intake 2109.23 ml  Output --  Net 2109.23 ml   Filed Weights   04/09/20 1246  Weight: 60 kg    Examination:  General exam: Mild distress, crying, complaining of abdominal pain Respiratory system: Clear to auscultation. Respiratory effort normal. Cardiovascular system: S1 & S2 heard, RRR. No JVD, murmurs, rubs, gallops or clicks. No pedal edema. Gastrointestinal system: Abdomen is nondistended, soft, mild diffuse TTP, No organomegaly or masses felt. Normal bowel sounds heard. Central nervous system: Alert and oriented. No focal neurological deficits. Extremities: Symmetric 5 x 5 power. Skin: Ulcer noted to lateral and plantar surface right great toe, malodorous with purulence Psychiatry: Judgement and insight appear normal. Mood & affect appropriate.           Data Reviewed: I have personally reviewed following labs and imaging studies  CBC: Recent Labs  Lab 04/09/20 1522 04/10/20 0714 04/10/20 1828 04/11/20 0249 04/12/20 0212  WBC 7.3 8.1  --  10.1 8.8  NEUTROABS 5.1  --   --  8.4* 6.7  HGB 7.1* 6.3* 8.7* 8.3* 9.0*  HCT 22.5* 19.8* 27.5* 24.5* 28.4*  MCV 87.5 86.1  --  85.4 87.4  PLT 239 242  --  282 832   Basic Metabolic Panel: Recent Labs  Lab 04/09/20 1522 04/10/20 0714 04/11/20 0249 04/12/20 0212  NA 131*  --  134* 133*  K 4.7  --  4.2 3.8  CL 98  --  100 101  CO2 25  --  23 20*  GLUCOSE 94  --  87 240*  BUN 23*  --  19 18  CREATININE 1.28* 1.25* 1.19* 1.11*  CALCIUM 8.4*  --  8.5* 8.7*   GFR: Estimated Creatinine Clearance: 70.2 mL/min (A) (by C-G formula based on SCr of 1.11 mg/dL (H)). Liver Function Tests: Recent Labs  Lab 04/09/20 1522 04/11/20 0249 04/12/20 0212  AST 15 16 15   ALT 12 11 11   ALKPHOS 75 78 76  BILITOT 0.6 0.2* 0.8  PROT 7.8 7.7 8.1  ALBUMIN 2.4* 2.1* 2.0*   No  results for input(s): LIPASE, AMYLASE in the last 168 hours. No results for input(s): AMMONIA in the last 168 hours. Coagulation Profile: No results for input(s): INR, PROTIME in the last 168 hours. Cardiac Enzymes: No results for input(s): CKTOTAL, CKMB, CKMBINDEX, TROPONINI in the last 168 hours. BNP (last 3 results) No results for input(s): PROBNP in the last 8760 hours. HbA1C: Recent Labs    04/10/20 1122  HGBA1C 8.6*   CBG: Recent Labs  Lab 04/11/20 0753 04/11/20 1129 04/11/20 1748 04/11/20 2042 04/12/20 0507  GLUCAP 76 114* 160* 149* 227*   Lipid Profile: No results for input(s): CHOL, HDL, LDLCALC, TRIG, CHOLHDL, LDLDIRECT in the last 72 hours. Thyroid Function Tests: No results for input(s): TSH, T4TOTAL, FREET4, T3FREE, THYROIDAB in the last 72 hours. Anemia Panel: Recent Labs    04/10/20 1122  VITAMINB12 366  FOLATE 34.4  FERRITIN 128  TIBC 195*  IRON 8*  RETICCTPCT 0.9   Sepsis Labs: Recent Labs  Lab 04/09/20 1522 04/09/20 1711  LATICACIDVEN 0.6 0.5    Recent Results (from the past 240 hour(s))  SARS CORONAVIRUS 2 (TAT 6-24 HRS) Nasopharyngeal Nasopharyngeal Swab     Status: Abnormal   Collection Time: 04/05/20 11:05 PM   Specimen: Nasopharyngeal Swab  Result Value Ref Range Status   SARS Coronavirus 2 POSITIVE (A) NEGATIVE Final    Comment: (NOTE) SARS-CoV-2 target nucleic acids are DETECTED.  The SARS-CoV-2 RNA is generally detectable in upper and lower respiratory specimens during the acute phase of infection. Positive results are indicative of the presence of SARS-CoV-2 RNA. Clinical correlation with patient history and other diagnostic information is  necessary to determine patient infection status. Positive results do not rule out bacterial infection or co-infection with other viruses.  The expected result is Negative.  Fact Sheet for Patients: SugarRoll.be  Fact Sheet for Healthcare  Providers: https://www.woods-mathews.com/  This test is not yet approved or cleared by the [place] FDA and  has been authorized for detection and/or diagnosis of SARS-CoV-2 by FDA under an Emergency Use Authorization (EUA). This EUA will remain  in effect (meaning this test can be used) for the duration of the COVID-19 declaration under Section 564(b)(1) of the Act, 21 U. S.C. section 360bbb-3(b)(1), unless the authorization is terminated or revoked sooner.   Performed at [facility], [place] [address] [place], [place] [address]   Culture, blood (routine x 2)     Status: None (Preliminary result)   Collection Time: 04/09/20  3:27 PM   Specimen: BLOOD  Result Value Ref Range Status   Specimen Description   Final    BLOOD LEFT ANTECUBITAL Performed at [facility], [place] [address], [place], [place] [address]    Special Requests   Final    BOTTLES DRAWN AEROBIC AND ANAEROBIC Blood Culture adequate volume Performed at [facility], [address] [place], [place] [address]    Culture   Final    NO GROWTH 3 DAYS Performed at [facility], [place] [address], [place], [place] [address]    Report Status PENDING  Incomplete  Culture, blood (routine x 2)     Status: None (Preliminary result)   Collection Time: 04/09/20  4:07 PM   Specimen: BLOOD  Result Value Ref Range Status   Specimen Description   Final    BLOOD RIGHT ANTECUBITAL Performed at [facility], [address] [place], [place] [address]    Special Requests   Final    BOTTLES DRAWN AEROBIC AND ANAEROBIC Blood Culture adequate volume Performed at [facility], [address] [place], [place] [address]    Culture   Final    NO GROWTH 3 DAYS Performed at [facility], [place] [address] [place], [place] [address]    Report Status PENDING  Incomplete         Radiology Studies: DG Abd Portable 1V  Result Date: 04/11/2020 CLINICAL DATA:   Abdominal pain low back pain. Recent diagnosis COVID infection. EXAM: PORTABLE ABDOMEN - 1 VIEW COMPARISON:  05/28/2019 FINDINGS: Right upper quadrant clips compatible with cholecystectomy. A small electronic device projects over the right abdomen, possibly the patient's Dexcom G6 device. Bowel gas pattern is unremarkable. No significant abnormal calcifications are identified. A cause for the patient's abdominal pain  is not observed. IMPRESSION: 1. Unremarkable bowel gas pattern. 2. Prior cholecystectomy. 3. Small electronic device projects over the right abdomen, possibly the patient's Dexcom G6 device. Electronically Signed   By: Van Clines M.D.   On: 04/11/2020 09:12   VAS [place] ABI WITH/WO TBI  Result Date: 04/11/2020 LOWER EXTREMITY DOPPLER STUDY Indications: RT foot ulceration. High Risk Factors: Hypertension, Diabetes Type 1 .  Comparison Study: 04-11-2019 MRI of right forefoot without contrast study                   findings suggested acute osteomyelitis and cellulitis adjacent                   to ulceration. No prior ultrasound studies. Performing Technologist: Darlin Coco RDMS  Examination Guidelines: A complete evaluation includes at minimum, Doppler waveform signals and systolic blood pressure reading at the level of bilateral brachial, anterior tibial, and posterior tibial arteries, when vessel segments are accessible. Bilateral testing is considered an integral part of a complete examination. Photoelectric Plethysmograph (PPG) waveforms and toe systolic pressure readings are included as required and additional duplex testing as needed. Limited examinations for reoccurring indications may be performed as noted.  ABI Findings: +---------+------------------+-----+----------+--------+ Right    Rt Pressure (mmHg)IndexWaveform  Comment  +---------+------------------+-----+----------+--------+ Brachial 195                    triphasic           +---------+------------------+-----+----------+--------+ PTA      255               1.31 biphasic           +---------+------------------+-----+----------+--------+ DP       255               1.31 monophasic         +---------+------------------+-----+----------+--------+ Great Toe152               0.78 Normal             +---------+------------------+-----+----------+--------+ +---------+------------------+-----+---------+-------+ Left     Lt Pressure (mmHg)IndexWaveform Comment +---------+------------------+-----+---------+-------+ Brachial 191                                     +---------+------------------+-----+---------+-------+ PTA      255               1.31 triphasic        +---------+------------------+-----+---------+-------+ DP       255               1.31 triphasic        +---------+------------------+-----+---------+-------+ Great Toe198               1.02 Normal           +---------+------------------+-----+---------+-------+ +-------+-----------+-----------+------------+------------+ ABI/TBIToday's ABIToday's TBIPrevious ABIPrevious TBI +-------+-----------+-----------+------------+------------+ Right  [place]         0.78                                +-------+-----------+-----------+------------+------------+ Left   [place]         1.02                                +-------+-----------+-----------+------------+------------+ Exercise: +----+--------+-------+--------+ TimeLocationSymptomComments +----+--------+-------+--------+  Arterial wall calcification precludes  accurate ankle pressures and ABIs.  Summary: Right: Resting right ankle-brachial index indicates noncompressible right lower extremity arteries. The right toe-brachial index is normal. ABIs are unreliable. Left: Resting left ankle-brachial index indicates noncompressible left lower extremity arteries. The left toe-brachial index is normal. ABIs are unreliable.  *See table(s)  above for measurements and observations.     Preliminary         Scheduled Meds: . sodium chloride   Intravenous Once  . vitamin C  500 mg Oral Daily  . busPIRone  5 mg Oral TID  . carvedilol  6.25 mg Oral BID WC  . collagenase   Topical Daily  . heparin  5,000 Units Subcutaneous Q8H  . hydrALAZINE  50 mg Oral Q6H  . insulin aspart  0-6 Units Subcutaneous TID WC  . insulin glargine  6 Units Subcutaneous Daily  . isosorbide mononitrate  30 mg Oral Daily  . metoCLOPramide (REGLAN) injection  10 mg Intravenous Q8H  . mirtazapine  30 mg Oral QHS  . pantoprazole  40 mg Oral BID  . scopolamine  1 patch Transdermal Q72H  . zinc sulfate  220 mg Oral Daily   Continuous Infusions: . sodium chloride Stopped (04/09/20 1818)  . ceFEPime (MAXIPIME) IV 2 g (04/12/20 0552)  . vancomycin 1,000 mg (04/11/20 2340)     LOS: 2 days    Time spent: 38 minutes spent on chart review, discussion with nursing staff, consultants, updating family and interview/physical exam; more than 50% of that time was spent in counseling and/or coordination of care.    Rachana Malesky J [place] DO [facility] Available via Epic secure chat 7am-7pm After these hours, please refer to coverage provider listed on [email or web address] 04/12/2020, 10:33 AM

## 2020-04-12 NOTE — Progress Notes (Signed)
The patient appears to be sleeping most of the time when checked frequently.  As soon as she hears someone come to the room, she wakes up from sleep and will ask for pain medication and falls right back to sleep.

## 2020-04-12 NOTE — H&P (View-Only) (Signed)
Patient ID: [name], female   DOB: Aug 28, 1990, 30 y.o.   MRN: [id number] Patient's ABIs show significant calcified disease however there is inline flow to both lower extremities.  ABIs are falsely elevated.  We will plan for a first ray amputation tomorrow right foot and if there are wound healing problems discussed with patient we will consult vascular vein surgery for vascular evaluation.  At this point I feel its priority to remove the abscess infected soft tissue and bone.

## 2020-04-12 NOTE — Plan of Care (Signed)
  Problem: Education: Goal: Knowledge of General Education information will improve Description: Including pain rating scale, medication(s)/side effects and non-pharmacologic comfort measures 04/12/2020 0837 by Madaline Brilliant, RN Outcome: Progressing 04/12/2020 0836 by Madaline Brilliant, RN Outcome: Progressing   Problem: Health Behavior/Discharge Planning: Goal: Ability to manage health-related needs will improve 04/12/2020 0837 by Madaline Brilliant, RN Outcome: Progressing 04/12/2020 0836 by Madaline Brilliant, RN Outcome: Progressing   Problem: Clinical Measurements: Goal: Ability to maintain clinical measurements within normal limits will improve 04/12/2020 0837 by Madaline Brilliant, RN Outcome: Progressing 04/12/2020 0836 by Madaline Brilliant, RN Outcome: Progressing Goal: Will remain free from infection 04/12/2020 0837 by Madaline Brilliant, RN Outcome: Progressing 04/12/2020 0836 by Madaline Brilliant, RN Outcome: Progressing Goal: Diagnostic test results will improve 04/12/2020 0837 by Madaline Brilliant, RN Outcome: Progressing 04/12/2020 0836 by Madaline Brilliant, RN Outcome: Progressing Goal: Respiratory complications will improve 04/12/2020 0837 by Madaline Brilliant, RN Outcome: Progressing 04/12/2020 0836 by Madaline Brilliant, RN Outcome: Progressing Goal: Cardiovascular complication will be avoided 04/12/2020 0837 by Madaline Brilliant, RN Outcome: Progressing 04/12/2020 0836 by Madaline Brilliant, RN Outcome: Progressing   Problem: Activity: Goal: Risk for activity intolerance will decrease 04/12/2020 0837 by Madaline Brilliant, RN Outcome: Progressing 04/12/2020 0836 by Madaline Brilliant, RN Outcome: Progressing   Problem: Nutrition: Goal: Adequate nutrition will be maintained 04/12/2020 0837 by Madaline Brilliant, RN Outcome: Progressing 04/12/2020 0836 by Madaline Brilliant, RN Outcome: Progressing   Problem: Coping: Goal: Level of anxiety will decrease 04/12/2020 0837 by Madaline Brilliant, RN Outcome: Progressing 04/12/2020 0836 by Madaline Brilliant, RN Outcome: Progressing   Problem: Elimination: Goal: Will not experience complications related to bowel motility 04/12/2020 0837 by Madaline Brilliant, RN Outcome: Progressing 04/12/2020 0836 by Madaline Brilliant, RN Outcome: Progressing Goal: Will not experience complications related to urinary retention 04/12/2020 0837 by Madaline Brilliant, RN Outcome: Progressing 04/12/2020 0836 by Madaline Brilliant, RN Outcome: Progressing   Problem: Pain Managment: Goal: General experience of comfort will improve 04/12/2020 0837 by Madaline Brilliant, RN Outcome: Progressing 04/12/2020 0836 by Madaline Brilliant, RN Outcome: Progressing   Problem: Safety: Goal: Ability to remain free from injury will improve 04/12/2020 0837 by Madaline Brilliant, RN Outcome: Progressing 04/12/2020 0836 by Madaline Brilliant, RN Outcome: Progressing   Problem: Skin Integrity: Goal: Risk for impaired skin integrity will decrease 04/12/2020 0837 by Madaline Brilliant, RN Outcome: Progressing 04/12/2020 0836 by Madaline Brilliant, RN Outcome: Progressing

## 2020-04-12 NOTE — Progress Notes (Signed)
I was informed that patient was requesting pain medicine, when I told her that I had Oxi and flexeril she started crying saying that she needed the morphine because she will throw up. I informed her that she was anticipating something that hasn't happened yet and that I have her scheduled Reglan to help with nausea. I informed her that Morphine can also cause nausea and that I will remain in the room with her to see how she reacts to medication she had taken. She was sitting on side of bed with her back towards me and within minutes she started to dry heave. I could see her reflection in the window and it appeared that she was sticking her finger down her throat I told her I can see you she then turned to the side and I told her I still see you then she bent over and I told her she needed to sit up due to blood rushing to her her head. She stopped crying and just sat on the bed. I remained with her for 1 hr. I then brought her her Morphine and Phenergan with her PO Hydralizine no vomiting seen. Will continue to monitor. Arthor Captain LPN

## 2020-04-12 NOTE — Plan of Care (Signed)

## 2020-04-12 NOTE — Progress Notes (Signed)
Patient ID: [name], female   DOB: 1991/02/18, 30 y.o.   MRN: [id number] Patient's ABIs show significant calcified disease however there is inline flow to both lower extremities.  ABIs are falsely elevated.  We will plan for a first ray amputation tomorrow right foot and if there are wound healing problems discussed with patient we will consult vascular vein surgery for vascular evaluation.  At this point I feel its priority to remove the abscess infected soft tissue and bone.

## 2020-04-13 ENCOUNTER — Inpatient Hospital Stay (HOSPITAL_COMMUNITY): Payer: 59 | Admitting: Anesthesiology

## 2020-04-13 ENCOUNTER — Encounter (HOSPITAL_COMMUNITY): Payer: Self-pay | Admitting: Internal Medicine

## 2020-04-13 ENCOUNTER — Encounter (HOSPITAL_COMMUNITY)
Admission: EM | Disposition: A | Discharge status: Home / Self Care | Payer: Self-pay | Source: Home / Self Care | Attending: Internal Medicine

## 2020-04-13 DIAGNOSIS — E1069 Type 1 diabetes mellitus with other specified complication: Secondary | ICD-10-CM | POA: Diagnosis not present

## 2020-04-13 DIAGNOSIS — M869 Osteomyelitis, unspecified: Secondary | ICD-10-CM

## 2020-04-13 DIAGNOSIS — L97524 Non-pressure chronic ulcer of other part of left foot with necrosis of bone: Secondary | ICD-10-CM

## 2020-04-13 DIAGNOSIS — E10621 Type 1 diabetes mellitus with foot ulcer: Secondary | ICD-10-CM | POA: Diagnosis not present

## 2020-04-13 DIAGNOSIS — E43 Unspecified severe protein-calorie malnutrition: Secondary | ICD-10-CM | POA: Diagnosis not present

## 2020-04-13 HISTORY — PX: AMPUTATION: SHX166

## 2020-04-13 LAB — CBC WITH DIFFERENTIAL/PLATELET
Abs Immature Granulocytes: 0.08 10*3/uL — ABNORMAL HIGH (ref 0.00–0.07)
Basophils Absolute: 0 10*3/uL (ref 0.0–0.1)
Basophils Relative: 0 %
Eosinophils Absolute: 0.1 10*3/uL (ref 0.0–0.5)
Eosinophils Relative: 1 %
HCT: 24.3 % — ABNORMAL LOW (ref 36.0–46.0)
Hemoglobin: 7.9 g/dL — ABNORMAL LOW (ref 12.0–15.0)
Immature Granulocytes: 1 %
Lymphocytes Relative: 24 %
Lymphs Abs: 2.2 10*3/uL (ref 0.7–4.0)
MCH: 28.3 pg (ref 26.0–34.0)
MCHC: 32.5 g/dL (ref 30.0–36.0)
MCV: 87.1 fL (ref 80.0–100.0)
Monocytes Absolute: 0.8 10*3/uL (ref 0.1–1.0)
Monocytes Relative: 9 %
Neutro Abs: 6 10*3/uL (ref 1.7–7.7)
Neutrophils Relative %: 65 %
Platelets: 356 10*3/uL (ref 150–400)
RBC: 2.79 MIL/uL — ABNORMAL LOW (ref 3.87–5.11)
RDW: 12.8 % (ref 11.5–15.5)
WBC: 9.2 10*3/uL (ref 4.0–10.5)
nRBC: 0 % (ref 0.0–0.2)

## 2020-04-13 LAB — COMPREHENSIVE METABOLIC PANEL
ALT: 11 U/L (ref 0–44)
AST: 15 U/L (ref 15–41)
Albumin: 2 g/dL — ABNORMAL LOW (ref 3.5–5.0)
Alkaline Phosphatase: 73 U/L (ref 38–126)
Anion gap: 10 (ref 5–15)
BUN: 16 mg/dL (ref 6–20)
CO2: 21 mmol/L — ABNORMAL LOW (ref 22–32)
Calcium: 8.6 mg/dL — ABNORMAL LOW (ref 8.9–10.3)
Chloride: 102 mmol/L (ref 98–111)
Creatinine, Ser: 1.23 mg/dL — ABNORMAL HIGH (ref 0.44–1.00)
GFR, Estimated: >60 mL/min (ref >60)
Glucose, Bld: 210 mg/dL — ABNORMAL HIGH (ref 70–99)
Potassium: 3.7 mmol/L (ref 3.5–5.1)
Sodium: 133 mmol/L — ABNORMAL LOW (ref 135–145)
Total Bilirubin: 0.8 mg/dL (ref 0.3–1.2)
Total Protein: 7.7 g/dL (ref 6.5–8.1)

## 2020-04-13 LAB — SURGICAL PCR SCREEN
MRSA, PCR: NEGATIVE
Staphylococcus aureus: POSITIVE — AB

## 2020-04-13 LAB — D-DIMER, QUANTITATIVE: D-Dimer, Quant: 2.72 ug/mL-FEU — ABNORMAL HIGH (ref 0.00–0.50)

## 2020-04-13 LAB — C-REACTIVE PROTEIN: CRP: 10.2 mg/dL — ABNORMAL HIGH (ref <1.0)

## 2020-04-13 LAB — GLUCOSE, CAPILLARY
Glucose-Capillary: 70 mg/dL (ref 70–99)
Glucose-Capillary: 137 mg/dL — ABNORMAL HIGH (ref 70–99)
Glucose-Capillary: 168 mg/dL — ABNORMAL HIGH (ref 70–99)
Glucose-Capillary: 121 mg/dL — ABNORMAL HIGH (ref 70–99)
Glucose-Capillary: 150 mg/dL — ABNORMAL HIGH (ref 70–99)
Glucose-Capillary: 137 mg/dL — ABNORMAL HIGH (ref 70–99)
Glucose-Capillary: 156 mg/dL — ABNORMAL HIGH (ref 70–99)

## 2020-04-13 SURGERY — AMPUTATION, FOOT, RAY
Anesthesia: Monitor Anesthesia Care | Laterality: Right

## 2020-04-13 MED ORDER — ROPIVACAINE HCL 5 MG/ML IJ SOLN
INTRAMUSCULAR | Status: DC | PRN
  Administered 2020-04-13: 20 mL via PERINEURAL

## 2020-04-13 MED ORDER — HYDROMORPHONE HCL 1 MG/ML IJ SOLN
0.2500 mg | INTRAMUSCULAR | Status: DC | PRN
Start: 2020-04-13 — End: 2020-04-13

## 2020-04-13 MED ORDER — ONDANSETRON HCL 4 MG/2ML IJ SOLN
INTRAMUSCULAR | Status: DC | PRN
  Administered 2020-04-13: 4 mg via INTRAVENOUS

## 2020-04-13 MED ORDER — SODIUM CHLORIDE 0.9 % IV SOLN: INTRAVENOUS | Status: DC

## 2020-04-13 MED ORDER — MIDAZOLAM HCL 2 MG/2ML IJ SOLN
INTRAMUSCULAR | Status: AC
  Filled 2020-04-13: qty 2

## 2020-04-13 MED ORDER — PROMETHAZINE HCL 25 MG/ML IJ SOLN
INTRAMUSCULAR | Status: AC
  Filled 2020-04-13: qty 1

## 2020-04-13 MED ORDER — VANCOMYCIN HCL 1250 MG/250ML IV SOLN
1250.0000 mg | INTRAVENOUS | Status: DC
  Administered 2020-04-14: 1250 mg via INTRAVENOUS
  Filled 2020-04-13 (×2): qty 250

## 2020-04-13 MED ORDER — OXYCODONE HCL 5 MG PO TABS: 5.0000 mg | ORAL_TABLET | Freq: Once | ORAL | Status: DC | PRN

## 2020-04-13 MED ORDER — FENTANYL CITRATE (PF) 250 MCG/5ML IJ SOLN
INTRAMUSCULAR | Status: AC
  Filled 2020-04-13: qty 5

## 2020-04-13 MED ORDER — LIDOCAINE HCL (PF) 1 % IJ SOLN
INTRAMUSCULAR | Status: DC | PRN
  Administered 2020-04-13: 20 mL

## 2020-04-13 MED ORDER — HYDROMORPHONE HCL 1 MG/ML IJ SOLN
INTRAMUSCULAR | Status: AC
  Filled 2020-04-13: qty 1

## 2020-04-13 MED ORDER — OXYCODONE HCL 5 MG/5ML PO SOLN: 5.0000 mg | Freq: Once | ORAL | Status: DC | PRN

## 2020-04-13 MED ORDER — PROPOFOL 10 MG/ML IV BOLUS
INTRAVENOUS | Status: DC | PRN
  Administered 2020-04-13: 20 mg via INTRAVENOUS

## 2020-04-13 MED ORDER — PROMETHAZINE HCL 25 MG/ML IJ SOLN: 6.2500 mg | INTRAMUSCULAR | Status: DC | PRN

## 2020-04-13 MED ORDER — 0.9 % SODIUM CHLORIDE (POUR BTL) OPTIME
TOPICAL | Status: DC | PRN
  Administered 2020-04-13: 1000 mL

## 2020-04-13 MED ORDER — MEPERIDINE HCL 25 MG/ML IJ SOLN: 6.2500 mg | INTRAMUSCULAR | Status: DC | PRN

## 2020-04-13 MED ORDER — MIDAZOLAM HCL 2 MG/2ML IJ SOLN
INTRAMUSCULAR | Status: DC | PRN
  Administered 2020-04-13 (×2): 1 mg via INTRAVENOUS

## 2020-04-13 MED ORDER — FENTANYL CITRATE (PF) 250 MCG/5ML IJ SOLN
INTRAMUSCULAR | Status: DC | PRN
  Administered 2020-04-13: 50 ug via INTRAVENOUS

## 2020-04-13 MED ORDER — PROPOFOL 500 MG/50ML IV EMUL
INTRAVENOUS | Status: DC | PRN
  Administered 2020-04-13: 100 ug/kg/min via INTRAVENOUS

## 2020-04-13 MED ORDER — DEXAMETHASONE SODIUM PHOSPHATE 10 MG/ML IJ SOLN
INTRAMUSCULAR | Status: DC | PRN
  Administered 2020-04-13: 10 mg via INTRAVENOUS

## 2020-04-13 SURGICAL SUPPLY — 33 items
BLADE SAW SGTL MED 73X18.5 STR (BLADE) IMPLANT
BLADE SURG 21 STRL SS (BLADE) ×1 IMPLANT
BNDG COHESIVE 4X5 TAN STRL (GAUZE/BANDAGES/DRESSINGS) ×2 IMPLANT
BNDG GAUZE ELAST 4 BULKY (GAUZE/BANDAGES/DRESSINGS) ×2 IMPLANT
COVER SURGICAL LIGHT HANDLE (MISCELLANEOUS) ×4 IMPLANT
COVER WAND RF STERILE (DRAPES) ×1 IMPLANT
DRAPE DERMATAC (DRAPES) ×1 IMPLANT
DRAPE U-SHAPE 47X51 STRL (DRAPES) ×4 IMPLANT
DRSG ADAPTIC 3X8 NADH LF (GAUZE/BANDAGES/DRESSINGS) ×2 IMPLANT
DRSG PAD ABDOMINAL 8X10 ST (GAUZE/BANDAGES/DRESSINGS) ×2 IMPLANT
DURAPREP 26ML APPLICATOR (WOUND CARE) ×2 IMPLANT
ELECT REM PT RETURN 9FT ADLT (ELECTROSURGICAL) ×2
ELECTRODE REM PT RTRN 9FT ADLT (ELECTROSURGICAL) ×1 IMPLANT
FILTER NEPTUNE SMOKE EVACUATOR (MISCELLANEOUS) ×1 IMPLANT
GAUZE SPONGE 4X4 12PLY STRL (GAUZE/BANDAGES/DRESSINGS) ×2 IMPLANT
GLOVE BIOGEL PI IND STRL 9 (GLOVE) ×1 IMPLANT
GLOVE BIOGEL PI INDICATOR 9 (GLOVE) ×1
GLOVE SURG ORTHO 9.0 STRL STRW (GLOVE) ×2 IMPLANT
GOWN STRL REUS W/ TWL XL LVL3 (GOWN DISPOSABLE) ×2 IMPLANT
GOWN STRL REUS W/TWL XL LVL3 (GOWN DISPOSABLE) ×4
KIT BASIN OR (CUSTOM PROCEDURE TRAY) ×2 IMPLANT
KIT DRSG PREVENA PLUS 7DAY 125 (MISCELLANEOUS) ×1 IMPLANT
KIT PREVENA INCISION MGT 13 (CANNISTER) ×1 IMPLANT
KIT TURNOVER KIT B (KITS) ×2 IMPLANT
NS IRRIG 1000ML POUR BTL (IV SOLUTION) ×2 IMPLANT
PACK ORTHO EXTREMITY (CUSTOM PROCEDURE TRAY) ×2 IMPLANT
PAD ARMBOARD 7.5X6 YLW CONV (MISCELLANEOUS) ×4 IMPLANT
SPONGE LAP 18X18 X RAY DECT (DISPOSABLE) ×1 IMPLANT
STOCKINETTE IMPERVIOUS LG (DRAPES) IMPLANT
SUT ETHILON 2 0 PSLX (SUTURE) ×4 IMPLANT
TOWEL GREEN STERILE (TOWEL DISPOSABLE) ×2 IMPLANT
TUBE CONNECTING 12X1/4 (SUCTIONS) ×2 IMPLANT
YANKAUER SUCT BULB TIP NO VENT (SUCTIONS) ×2 IMPLANT

## 2020-04-13 NOTE — Progress Notes (Addendum)
Pharmacy Antibiotic Note  [name] is a 30 y.o. female admitted on 04/09/2020 with cellulitis with diabetic foot ulcer.  Pharmacy has been consulted for vancomycin and cefepime dosing. MRI findings suggestive of R great toe osteomyelitis and small 1st MTP joint effusion would could present septic arthritis. Patient has been receiving vancomycin 107m q12h and cefepime 2g q8h. Today, Scr 1.23 with current CrCl ~63 ml/min and UOP not documented. Plan for 1st toe amputation today with Dr. DSharol Given   Plan: Adjust vancomycin to [id number] based on current renal function Continue cefepime 2g IV q8h Monitor renal function, plan for antibiotics after amputation, and vancomycin levels as appropriate  Height: 5' 7"  (170.2 cm) Weight: 60 kg (132 lb 3.3 oz) IBW/kg (Calculated) : 61.6  Temp (24hrs), Avg:98.5 F (36.9 C), Min:97.7 F (36.5 C), Max:99.7 F (37.6 C)  Recent Labs  Lab 04/09/20 1522 04/09/20 1711 04/10/20 0714 04/11/20 0249 04/12/20 0212 04/13/20 0233  WBC 7.3  --  8.1 10.1 8.8 9.2  CREATININE 1.28*  --  1.25* 1.19* 1.11* 1.23*  LATICACIDVEN 0.6 0.5  --   --   --   --     Estimated Creatinine Clearance: 63.3 mL/min (A) (by C-G formula based on SCr of 1.23 mg/dL (H)).    Allergies  Allergen Reactions  . Other Anaphylaxis    Reaction to brBoliviauts   . Lactose Intolerance (Gi) Diarrhea   Vanc 1/10 >> Zosyn x1 1/10 Cefepime 1/11 >>  Remdesivir 1/11 >> 1/13  1/10 BCx - NGTD  GrCristela FeltPharmD Clinical Pharmacist  04/13/2020 8:54 AM

## 2020-04-13 NOTE — Progress Notes (Signed)
Patient's current iv is not working.  Tried to give scheduled iv abx and scheduled Reglan iv but was unable.  Patient is an extremely hard stick.  This is her second iv loss in less than 24 hours.  Placed iv team consult and day shift nurse will follow up with 0600 iv meds that were not given.

## 2020-04-13 NOTE — Transfer of Care (Signed)
Immediate Anesthesia Transfer of Care Note  Patient: Gabreille Dardis Sutcliffe  Procedure(s) Performed: 1ST RAY AMPUTATION RIGHT FOOT (Right )  Patient Location: PACU  Anesthesia Type:MAC combined with regional for post-op pain  Level of Consciousness: awake, alert  and patient cooperative  Airway & Oxygen Therapy: Patient Spontanous Breathing and Patient connected to face mask oxygen  Post-op Assessment: Report given to RN and Post -op Vital signs reviewed and stable  Post vital signs: Reviewed and stable  Last Vitals:  Vitals Value Taken Time  BP 169/101   Temp    Pulse 85   Resp 20   SpO2 100     Last Pain:  Vitals:   04/13/20 0845  TempSrc: Oral  PainSc:       Patients Stated Pain Goal: 3 (94/58/59 2924)  Complications: No complications documented.

## 2020-04-13 NOTE — Anesthesia Procedure Notes (Signed)
Anesthesia Regional Block: Adductor canal block   Pre-Anesthetic Checklist: ,, timeout performed, Correct Patient, Correct Site, Correct Laterality, Correct Procedure, Correct Position, site marked, Risks and benefits discussed,  Surgical consent,  Pre-op evaluation,  At surgeon's request and post-op pain management  Laterality: Right  Prep: Maximum Sterile Barrier Precautions used, chloraprep       Needles:  Injection technique: Single-shot  Needle Type: Echogenic Stimulator Needle     Needle Length: 9cm  Needle Gauge: 22     Additional Needles:   Procedures:,,,, ultrasound used (permanent image in chart),,,,  Narrative:  Start time: 04/13/2020 9:55 AM End time: 04/13/2020 10:00 AM Injection made incrementally with aspirations every 5 mL.  Performed by: Personally  Anesthesiologist: Pervis Hocking, DO  Additional Notes: Monitors applied. No increased pain on injection. No increased resistance to injection. Injection made in 5cc increments. Good needle visualization. Patient tolerated procedure well.

## 2020-04-13 NOTE — Progress Notes (Signed)
IV team at the bedside. 

## 2020-04-13 NOTE — Anesthesia Preprocedure Evaluation (Addendum)
Anesthesia Evaluation  Patient identified by MRN, date of birth, ID band Patient awake    Reviewed: Allergy & Precautions, NPO status , Patient's Chart, lab work & pertinent test results, reviewed documented beta blocker date and time   Airway Mallampati: I  TM Distance: >3 FB Neck ROM: Full    Dental  (+) Edentulous Upper, Edentulous Lower   Pulmonary pneumonia (COVID + 04/05/20), unresolved,    Pulmonary exam normal breath sounds clear to auscultation       Cardiovascular hypertension, Pt. on medications and Pt. 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  Patient reports onset roughly 1 week ago when patient's callus on right foot plantar aspect started having a small ulcer now with purulent discharge.  Patient also reports subjective fevers and chills.  a1c 8.6  Renal/GU CRF and Renal InsufficiencyRenal diseaseCKD 3, Cr 1.23  negative genitourinary   Musculoskeletal  (+) Fibromyalgia -Osteo abscess right great toe   Abdominal   Peds  Hematology  (+) Blood dyscrasia, anemia , Hb 7.9/24.3   Anesthesia Other Findings   Reproductive/Obstetrics negative OB ROS                            Anesthesia Physical Anesthesia Plan  ASA: IV  Anesthesia Plan: MAC and Regional   Post-op Pain Management:    Induction:   PONV Risk Score and Plan: 2 and Propofol infusion and TIVA  Airway Management Planned: Natural Airway  and Simple Face Mask  Additional Equipment: None  Intra-op Plan:   Post-operative Plan:   Informed Consent: I have reviewed the patients History and Physical, chart, labs and discussed the procedure including the risks, benefits and alternatives for the proposed anesthesia with the patient or authorized representative who has indicated his/her understanding and acceptance.       Plan Discussed with: CRNA  Anesthesia Plan Comments:         Anesthesia Quick Evaluation

## 2020-04-13 NOTE — Interval H&P Note (Signed)
History and Physical Interval Note:  4/83/0159 9:68 AM  [name]  has presented today for surgery, with the diagnosis of Osteomyelitis Abscess Right Great Toe.  The various methods of treatment have been discussed with the patient and family. After consideration of risks, benefits and other options for treatment, the patient has consented to  Procedure(s): 1ST RAY AMPUTATION RIGHT FOOT (Right) as a surgical intervention.  The patient's history has been reviewed, patient examined, no change in status, stable for surgery.  I have reviewed the patient's chart and labs.  Questions were answered to the patient's satisfaction.     Newt Minion

## 2020-04-13 NOTE — Progress Notes (Signed)
PROGRESS NOTE  [name]  DOB: [birth year]  PCP: [name], MD XFG:[id number]  DOA: 04/09/2020  LOS: 3 days   Chief Complaint  Patient presents with  . Wound Infection    Covid +   Brief narrative: [name] is a 30 year old female with PMH significant for DM 1, HTN, CKD 3A, depression/anxiety.   Patient presented to the ED on 1/10 with complaint of progressive right foot pain, swelling and discharge.  Her symptoms started roughly a week ago when a callus on her right foot plantar aspect started having a small ulcer and draining purulent discharge.    Right foot x-ray showed soft tissue swelling with underlying soft tissue gas along the medial aspect of the first metatarsal head, no findings to suggest acute osteomyelitis.   Of note, patient is vaccinated but she got exposed to her husband who had COVID.   On 1/6, which is 4 days prior presentation, patient also tested positive for COVID.   Patient was admitted to hospitalist service. Orthopedic consultation was obtained. MR right foot was obtained which showed soft tissue ulceration lateral aspect forefoot at level of first metatarsal head with adjacent cellulitis, no fluid collection/abscess; but notable patchy T1 marrow signal adjacent to the soft tissue infection consistent with acute osteomyelitis. 1/14, patient underwent first ray amputation of the right foot with local tissue rearrangement for wound closure.  Subjective: Patient was seen and examined this afternoon. Complains of postop pain.  Right foot has a bandage on and he is attached to wound VAC.  Assessment/Plan: Right foot cellulitis/osteomyelitis Diabetic foot ulcer -Presented with low-grade fever, pain, swelling and discharge from right foot great toe.   -MRI right foot findings suggestive of acute osteomyelitis.   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 [name].   -Continue pain management.  COVID 19 infection -Not in  respite distress.  Completed 3-day course of IV remdesivir.   -Currently respiratory status is stable on room air.    Type 1 diabetes mellitus -A1c 8.6 on 1/11. -Currently Lantus 60 units daily along with sliding scale insulin and Accu-Cheks. Recent Labs  Lab 04/12/20 1739 04/12/20 1948 04/13/20 0619 04/13/20 1032 04/13/20 1139  GLUCAP 137* 70 168* 121* 150*   Acute on chronic anemia -Baseline hemoglobin was 10.3 from a year ago. -Presented with low hemoglobin of 6.3.  1 unit PRBC transfused.  Hemoglobin today is at 7.9.  No other active bleeding noted. -Continue to monitor. Recent Labs    05/25/19 2158 05/27/19 0522 04/10/20 0714 04/10/20 1122 04/10/20 1828 04/11/20 0249 04/12/20 0212 04/13/20 0233  HGB 10.3*   < > 6.3*  --  8.7* 8.3* 9.0* 7.9*  MCV 85.8   < > 86.1  --   --  85.4 87.4 87.1  VITAMINB12  --   --   --  366  --   --   --   --   FOLATE  --   --   --  34.4  --   --   --   --   FERRITIN 207  --   --  128  --   --   --   --   TIBC  --   --   --  195*  --   --   --   --   IRON  --   --   --  8*  --   --   --   --   RETICCTPCT  --   --   --  0.9  --   --   --   --    < > = values in this interval not displayed.   Acute hyponatremia -Sodium running in the range of 130 to 135.  Continue to monitor -Currently on normal saline at 75 mill per hour. Recent Labs  Lab 04/09/20 1522 04/11/20 0249 04/12/20 0212 04/13/20 0233  NA 131* 134* 133* 133*   CKD 3a -Creatinine looks better than at baseline. Recent Labs    03/22/20 1232 03/22/20 1643 03/22/20 2039 03/23/20 0109 03/23/20 0819 03/24/20 0558 04/09/20 1522 04/10/20 0714 04/11/20 0249 04/12/20 0212 04/13/20 0233  BUN 57* 54* 50* 48* 43* 38* 23*  --  19 18 16   CREATININE 3.72* 4.09* 3.69* 3.76* 3.69* 2.81* 1.28* 1.25* 1.19* 1.11* 1.23*   Nausea/ Abdominal Pain -KUB 04/11/2020 with unremarkable bowel gas pattern, prior cholecystectomy with no acute findings.  -Patient was also noted by nursing staff  to be self inducing vomiting by sticking her finger down her throat. -Currently on Reglan and Phenergan as needed. -Continue to monitor.  Anxiety -As needed lorazepam 1 mg IV every 6 hours  Essential hypertension -Currently on carvedilol 6.25 mg p.o. twice daily, Hydralazine 50 mg PO q6h, Isosorbide mononitrate 30 mg p.o. daily. -Blood pressure in last 24 hours as gone up to 160s.  Probably also due to pain.  Continue to monitor for now. May need further adjustment of blood pressure medicine.  GERD: Continue Protonix 40 mg p.o. twice daily and Carafate 1 g p.o. q6h prn  Depression/anxiety:  -Continue BuSpar 5 mg p.o. 3 times daily and Lorazepam PRN 1 mg IV q6h  Insomnia: Mirtazapine 30 mg p.o. nightly  Mobility: Encourage ambulation.  PT eval may be needed. Code Status:   Code Status: Full Code  Nutritional status: Body mass index is 20.71 kg/m.     Diet Order            Diet Carb Modified Fluid consistency: Thin; Room service appropriate? Yes  Diet effective now                 DVT prophylaxis: SCDs Start: 04/13/20 1106 heparin injection 5,000 Units Start: 04/10/20 8416   Antimicrobials:  IV cefepime, IV vancomycin Fluid: Normal saline at 75 mill per hour Consultants: Orthopedics Family Communication:  None at bedside  Status is: Inpatient   Remains inpatient appropriate because: Underwent surgery today.  Continue to monitor  Dispo: The patient is from: Home              Anticipated d/c is to: Home              Anticipated d/c date is: 3 days              Patient currently is not medically stable to d/c.       Infusions:  . sodium chloride 1,000 mL (04/12/20 1837)  . sodium chloride 75 mL/hr at 04/13/20 1110  . sodium chloride 75 mL/hr at 04/13/20 1110  . ceFEPime (MAXIPIME) IV 2 g (04/13/20 1248)  . vancomycin      Scheduled Meds: . sodium chloride   Intravenous Once  . vitamin C  500 mg Oral Daily  . busPIRone  5 mg Oral TID  . carvedilol   6.25 mg Oral BID WC  . collagenase   Topical Daily  . heparin  5,000 Units Subcutaneous Q8H  . hydrALAZINE  50 mg Oral Q6H  . insulin aspart  0-6 Units Subcutaneous TID WC  .  insulin glargine  6 Units Subcutaneous Daily  . isosorbide mononitrate  30 mg Oral Daily  . metoCLOPramide (REGLAN) injection  10 mg Intravenous Q8H  . mirtazapine  30 mg Oral QHS  . pantoprazole  40 mg Oral BID  . scopolamine  1 patch Transdermal Q72H  . zinc sulfate  220 mg Oral Daily    Antimicrobials: Anti-infectives (From admission, onward)   Start     Dose/Rate Route Frequency Ordered Stop   04/13/20 1000  vancomycin (VANCOREADY) IVPB 1250 mg/250 mL        1,250 mg 166.7 mL/hr over 90 Minutes Intravenous Every 24 hours 04/13/20 0906     04/13/20 0800  ceFAZolin (ANCEF) IVPB 2g/100 mL premix        2 g 200 mL/hr over 30 Minutes Intravenous To [facility] 04/12/20 2232 04/13/20 0930   04/11/20 1400  ceFEPIme (MAXIPIME) 2 g in sodium chloride 0.9 % 100 mL IVPB        2 g 200 mL/hr over 30 Minutes Intravenous Every 8 hours 04/11/20 1012     04/11/20 1100  vancomycin (VANCOCIN) IVPB 1000 mg/200 mL premix  Status:  Discontinued        1,000 mg 200 mL/hr over 60 Minutes Intravenous Every 12 hours 04/11/20 1012 04/13/20 0906   04/11/20 1000  remdesivir 100 mg in sodium chloride 0.9 % 100 mL IVPB       "Followed by" Linked Group Details   100 mg 200 mL/hr over 30 Minutes Intravenous Daily 04/10/20 0526 04/12/20 1001   04/10/20 1800  ceFEPIme (MAXIPIME) 2 g in sodium chloride 0.9 % 100 mL IVPB  Status:  Discontinued        2 g 200 mL/hr over 30 Minutes Intravenous Every 12 hours 04/10/20 0613 04/11/20 1012   04/10/20 0800  vancomycin (VANCOREADY) IVPB 1250 mg/250 mL  Status:  Discontinued        1,250 mg 166.7 mL/hr over 90 Minutes Intravenous Every 24 hours 04/10/20 0613 04/11/20 1012   04/10/20 0615  remdesivir 200 mg in sodium chloride 0.9% 250 mL IVPB       "Followed by" Linked Group Details   200  mg 580 mL/hr over 30 Minutes Intravenous Once 04/10/20 0526 04/10/20 1043   04/10/20 0615  vancomycin (VANCOCIN) IVPB 1000 mg/200 mL premix  Status:  Discontinued        1,000 mg 200 mL/hr over 60 Minutes Intravenous  Once 04/10/20 0526 04/10/20 0613   04/10/20 0615  ceFEPIme (MAXIPIME) 2 g in sodium chloride 0.9 % 100 mL IVPB        2 g 200 mL/hr over 30 Minutes Intravenous  Once 04/10/20 0526 04/10/20 0711   04/09/20 1515  vancomycin (VANCOCIN) IVPB 1000 mg/200 mL premix        1,000 mg 200 mL/hr over 60 Minutes Intravenous  Once 04/09/20 1500 04/09/20 1818   04/09/20 1515  piperacillin-tazobactam (ZOSYN) IVPB 3.375 g        3.375 g 100 mL/hr over 30 Minutes Intravenous  Once 04/09/20 1500 04/09/20 1622      PRN meds: sodium chloride, acetaminophen **OR** acetaminophen, cyclobenzaprine, dicyclomine, hydrALAZINE, labetalol, LORazepam, morphine injection, oxyCODONE, promethazine, sucralfate   Objective: Vitals:   04/13/20 1306 04/13/20 1408  BP: (!) 154/93 134/85  Pulse: 87 80  Resp: 17 16  Temp: 97.6 F (36.4 C) 98.3 F (36.8 C)  SpO2: 100% 99%    Intake/Output Summary (Last 24 hours) at 04/13/2020 1437 Last data filed at 04/13/2020  1045 Gross per 24 hour  Intake 500 ml  Output 20 ml  Net 480 ml   Filed Weights   04/09/20 1246  Weight: 60 kg   Weight change:  Body mass index is 20.71 kg/m.   Physical Exam: General exam: Young, African-American female Skin: No rashes, lesions or ulcers. HEENT: Atraumatic, normocephalic, no obvious bleeding Lungs: Clear to auscultation bilaterally CVS: Regular rate and rhythm, no murmur GI/Abd soft, nontender, nondistended, bowel sound present CNS: Drowsy because of the effect of pain medicine and anesthesia at the time of surgery. Psychiatry: Mood appropriate Extremities: Right foot has bandage, wound attached to wound bed.  Data Review: I have personally reviewed the laboratory data and studies available.  Recent Labs  Lab  04/09/20 1522 04/10/20 0714 04/10/20 1828 04/11/20 0249 04/12/20 0212 04/13/20 0233  WBC 7.3 8.1  --  10.1 8.8 9.2  NEUTROABS 5.1  --   --  8.4* 6.7 6.0  HGB 7.1* 6.3* 8.7* 8.3* 9.0* 7.9*  HCT 22.5* 19.8* 27.5* 24.5* 28.4* 24.3*  MCV 87.5 86.1  --  85.4 87.4 87.1  PLT 239 242  --  282 307 356   Recent Labs  Lab 04/09/20 1522 04/10/20 0714 04/11/20 0249 04/12/20 0212 04/13/20 0233  NA 131*  --  134* 133* 133*  K 4.7  --  4.2 3.8 3.7  CL 98  --  100 101 102  CO2 25  --  23 20* 21*  GLUCOSE 94  --  87 240* 210*  BUN 23*  --  19 18 16   CREATININE 1.28* 1.25* 1.19* 1.11* 1.23*  CALCIUM 8.4*  --  8.5* 8.7* 8.6*    F/u labs ordered.  Signed, Terrilee Croak, MD [facility] 04/13/2020

## 2020-04-13 NOTE — Plan of Care (Signed)

## 2020-04-13 NOTE — Anesthesia Procedure Notes (Signed)
Anesthesia Regional Block: Popliteal block   Pre-Anesthetic Checklist: ,, timeout performed, Correct Patient, Correct Site, Correct Laterality, Correct Procedure, Correct Position, site marked, Risks and benefits discussed,  Surgical consent,  Pre-op evaluation,  At surgeon's request and post-op pain management  Laterality: Right  Prep: Maximum Sterile Barrier Precautions used, chloraprep       Needles:  Injection technique: Single-shot  Needle Type: Echogenic Stimulator Needle     Needle Length: 9cm  Needle Gauge: 22     Additional Needles:   Procedures:,,,, ultrasound used (permanent image in chart),,,,  Narrative:  Start time: 04/13/2020 9:20 AM End time: 04/13/2020 9:25 AM Injection made incrementally with aspirations every 5 mL.  Performed by: Personally  Anesthesiologist: Pervis Hocking, DO  Additional Notes: Monitors applied. No increased pain on injection. No increased resistance to injection. Injection made in 5cc increments. Good needle visualization. Patient tolerated procedure well.

## 2020-04-13 NOTE — Anesthesia Postprocedure Evaluation (Signed)
Anesthesia Post Note  Patient: [name]  Procedure(s) Performed: 1ST RAY AMPUTATION RIGHT FOOT (Right )     Patient location during evaluation: PACU Anesthesia Type: Regional and MAC Level of consciousness: awake and alert Pain management: pain level controlled Vital Signs Assessment: post-procedure vital signs reviewed and stable Respiratory status: spontaneous breathing, nonlabored ventilation and respiratory function stable Cardiovascular status: blood pressure returned to baseline and stable Postop Assessment: no apparent nausea or vomiting Anesthetic complications: no   No complications documented.  Last Vitals:  Vitals:   04/13/20 1243 04/13/20 1306  BP: (!) 149/88 (!) 154/93  Pulse:  87  Resp:  17  Temp:  36.4 C  SpO2:  100%    Last Pain:  Vitals:   04/13/20 1306  TempSrc: Oral  PainSc:                  Pervis Hocking

## 2020-04-13 NOTE — Op Note (Signed)
04/13/2020  07:68 AM  PATIENT:  [name]    PRE-OPERATIVE DIAGNOSIS:  Osteomyelitis Abscess Right Great Toe  POST-OPERATIVE DIAGNOSIS:  Same  PROCEDURE:  1ST RAY AMPUTATION RIGHT FOOT Local tissue rearrangement for wound closure 5 x 11 cm. Application of Prevena wound VAC 13 cm.   SURGEON:  Newt Minion, MD  PHYSICIAN ASSISTANT:None ANESTHESIA:   General  PREOPERATIVE INDICATIONS:  [name] is a  30 y.o. female with a diagnosis of Osteomyelitis Abscess Right Great Toe who failed conservative measures and elected for surgical management.    The risks benefits and alternatives were discussed with the patient preoperatively including but not limited to the risks of infection, bleeding, nerve injury, cardiopulmonary complications, the need for revision surgery, among others, and the patient was willing to proceed.  OPERATIVE IMPLANTS: pravena vac 13 cm  @ENCIMAGES @  OPERATIVE FINDINGS: Patient had a large abscess that required extensive soft tissue excision leaving a wound 5 x 11 cm.  OPERATIVE PROCEDURE: Patient was brought the operating room and underwent a regional anesthetic.  After adequate levels anesthesia were obtained patient's right lower extremity was prepped using DuraPrep draped into a sterile field a timeout was called.  A elliptical incision was made along the medial column right foot to excise the 2 ulcers the ulcerative tissue abscess and first ray were resected in 1 block of tissue through the base of the first metatarsal.  The wound margins were clear electrocautery was used for hemostasis the wound was irrigated with normal saline local tissue rearrangement was used to close the wound 5 x 11 cm.  A Prevena wound VAC was applied this had a good suction fit this was outlined with derma tack and Covan for compression patient was taken the PACU in stable condition   DISCHARGE PLANNING:  Antibiotic duration: IV antibiotics for 24 hours  postoperatively  Weightbearing: Nonweightbearing on the right  Pain medication: Opioid pathway  Dressing care/ Wound VAC: Continue Prevena wound VAC for 1 week  Ambulatory devices: Walker  Discharge to: Anticipate discharge to home.  Follow-up: In the office 1 week post operative.

## 2020-04-14 LAB — CBC WITH DIFFERENTIAL/PLATELET
Abs Immature Granulocytes: 0.08 10*3/uL — ABNORMAL HIGH (ref 0.00–0.07)
Basophils Absolute: 0 10*3/uL (ref 0.0–0.1)
Basophils Relative: 0 %
Eosinophils Absolute: 0 10*3/uL (ref 0.0–0.5)
Eosinophils Relative: 0 %
HCT: 23.8 % — ABNORMAL LOW (ref 36.0–46.0)
Hemoglobin: 7.8 g/dL — ABNORMAL LOW (ref 12.0–15.0)
Immature Granulocytes: 1 %
Lymphocytes Relative: 15 %
Lymphs Abs: 1.4 10*3/uL (ref 0.7–4.0)
MCH: 28.4 pg (ref 26.0–34.0)
MCHC: 32.8 g/dL (ref 30.0–36.0)
MCV: 86.5 fL (ref 80.0–100.0)
Monocytes Absolute: 0.8 10*3/uL (ref 0.1–1.0)
Monocytes Relative: 8 %
Neutro Abs: 7.3 10*3/uL (ref 1.7–7.7)
Neutrophils Relative %: 76 %
Platelets: 401 10*3/uL — ABNORMAL HIGH (ref 150–400)
RBC: 2.75 MIL/uL — ABNORMAL LOW (ref 3.87–5.11)
RDW: 12.8 % (ref 11.5–15.5)
WBC: 9.6 10*3/uL (ref 4.0–10.5)
nRBC: 0 % (ref 0.0–0.2)

## 2020-04-14 LAB — GLUCOSE, CAPILLARY
Glucose-Capillary: 129 mg/dL — ABNORMAL HIGH (ref 70–99)
Glucose-Capillary: 126 mg/dL — ABNORMAL HIGH (ref 70–99)
Glucose-Capillary: 154 mg/dL — ABNORMAL HIGH (ref 70–99)
Glucose-Capillary: 122 mg/dL — ABNORMAL HIGH (ref 70–99)

## 2020-04-14 LAB — CULTURE, BLOOD (ROUTINE X 2)
Culture: NO GROWTH
Culture: NO GROWTH
Special Requests: ADEQUATE
Special Requests: ADEQUATE

## 2020-04-14 LAB — COMPREHENSIVE METABOLIC PANEL
ALT: 11 U/L (ref 0–44)
AST: 15 U/L (ref 15–41)
Albumin: 2.1 g/dL — ABNORMAL LOW (ref 3.5–5.0)
Alkaline Phosphatase: 75 U/L (ref 38–126)
Anion gap: 13 (ref 5–15)
BUN: 21 mg/dL — ABNORMAL HIGH (ref 6–20)
CO2: 18 mmol/L — ABNORMAL LOW (ref 22–32)
Calcium: 8.7 mg/dL — ABNORMAL LOW (ref 8.9–10.3)
Chloride: 104 mmol/L (ref 98–111)
Creatinine, Ser: 1.49 mg/dL — ABNORMAL HIGH (ref 0.44–1.00)
GFR, Estimated: 48 mL/min — ABNORMAL LOW (ref >60)
Glucose, Bld: 147 mg/dL — ABNORMAL HIGH (ref 70–99)
Potassium: 3.6 mmol/L (ref 3.5–5.1)
Sodium: 135 mmol/L (ref 135–145)
Total Bilirubin: 1.1 mg/dL (ref 0.3–1.2)
Total Protein: 7.7 g/dL (ref 6.5–8.1)

## 2020-04-14 LAB — C-REACTIVE PROTEIN: CRP: 7.1 mg/dL — ABNORMAL HIGH (ref <1.0)

## 2020-04-14 LAB — D-DIMER, QUANTITATIVE: D-Dimer, Quant: 2.07 ug/mL-FEU — ABNORMAL HIGH (ref 0.00–0.50)

## 2020-04-14 MED ORDER — CARVEDILOL 12.5 MG PO TABS
12.5000 mg | ORAL_TABLET | Freq: Two times a day (BID) | ORAL | Status: DC
  Administered 2020-04-14 – 2020-04-16 (×4): 12.5 mg via ORAL
  Filled 2020-04-14 (×4): qty 1

## 2020-04-14 MED ORDER — ALUM & MAG HYDROXIDE-SIMETH 200-200-20 MG/5ML PO SUSP
30.0000 mL | ORAL | Status: DC | PRN
  Administered 2020-04-14: 30 mL via ORAL
  Filled 2020-04-14: qty 30

## 2020-04-14 MED ORDER — MORPHINE SULFATE (PF) 2 MG/ML IV SOLN
1.0000 mg | INTRAVENOUS | Status: DC | PRN
  Administered 2020-04-14 – 2020-04-15 (×4): 1 mg via INTRAVENOUS
  Filled 2020-04-14 (×4): qty 1

## 2020-04-14 NOTE — Progress Notes (Signed)
Subjective: 1 Day Post-Op Procedure(s) (LRB): 1ST RAY AMPUTATION RIGHT FOOT (Right) Patient reports pain as mild.  Issues with wound vac overnight.  Supplies ordered and nurse currently changing.  Objective: Vital signs in last 24 hours: Temp:  [97 F (36.1 C)-99 F (37.2 C)] 99 F (37.2 C) (01/15 0142) Pulse Rate:  [80-106] 106 (01/15 0142) Resp:  [16-18] 18 (01/15 0142) BP: (134-186)/(85-103) 185/98 (01/15 0142) SpO2:  [98 %-100 %] 100 % (01/15 0142)  Intake/Output from previous day: 01/14 0701 - 01/15 0700 In: 1789 [I.V.:1389; IV Piggyback:300] Out: 25 [Drains:5; Blood:20] Intake/Output this shift: No intake/output data recorded.  Recent Labs    04/12/20 0212 04/13/20 0233 04/14/20 0200  HGB 9.0* 7.9* 7.8*   Recent Labs    04/13/20 0233 04/14/20 0200  WBC 9.2 9.6  RBC 2.79* 2.75*  HCT 24.3* 23.8*  PLT 356 401*   Recent Labs    04/13/20 0233 04/14/20 0200  NA 133* 135  K 3.7 3.6  CL 102 104  CO2 21* 18*  BUN 16 21*  CREATININE 1.23* 1.49*  GLUCOSE 210* 147*  CALCIUM 8.6* 8.7*   No results for input(s): LABPT, INR in the last 72 hours.  Neurologically intact Neurovascular intact Sensation intact distally Intact pulses distally Compartment soft  Wound vac currently being changed   Assessment/Plan: 1 Day Post-Op Procedure(s) (LRB): 1ST RAY AMPUTATION RIGHT FOOT (Right) Up with therapy  NWB RLE Continue with wound vac F/u with Dr.Duda one week post-op      Aundra Dubin 04/14/2020, 8:16 AM

## 2020-04-14 NOTE — Progress Notes (Signed)
Preevena VAC alarming that there is a leak. RN changed vac dressing which had a large blood clot along sponge, and hooked back up to preevena and was still alarming that there was a leak. RN changed wound vac to large hospital one which showed good seal. RN reinforced to pt not to put any weight on foot. She verbalized understanding.

## 2020-04-14 NOTE — Progress Notes (Signed)
Charge nurse ordered supplies for total dressing change for Provena that is beeping.  Still waiting.  Will have day shift to follow up.

## 2020-04-14 NOTE — Progress Notes (Signed)
Orthopedic Tech Progress Note Patient Details:  [name] [birth year] [id number]  Ortho Devices Type of Ortho Device: Postop shoe/boot Ortho Device/Splint Location: Right Lower Extremity Ortho Device/Splint Interventions: Ordered   Post Interventions Instructions Provided: Adjustment of device,Care of device,Poper ambulation with device   Tammy Sours 04/14/2020, 7:25 AM

## 2020-04-14 NOTE — Plan of Care (Signed)
  Problem: Activity: Goal: Risk for activity intolerance will decrease Outcome: Progressing   Problem: Pain Managment: Goal: General experience of comfort will improve Outcome: Progressing   Problem: Safety: Goal: Ability to remain free from injury will improve Outcome: Progressing   

## 2020-04-14 NOTE — Evaluation (Signed)
Physical Therapy Evaluation Patient Details Name: [name] MRN: [id number] DOB: [birth year] Today's Date: 04/14/2020   History of Present Illness  Patient is a 30 y/o female with PMH significant for DM type 1, HTN, depression, anxiety. Patient presented to ED with swelling and discharge from R foot. 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 Patient also diagnosed with COVID-19.  Clinical Impression  PTA, patient lives with husband and child and was independent. Patient requires min guard for safety with OOB mobility with RW. Patient able to maintain NWB on R throughout ambulation. Patient presents with generalized weakness, impaired balance, decreased activity tolerance, and impaired functional mobility. Patient will benefit from skilled PT services during acute stay to address listed deficits. No PT follow up recommended at this time.     Follow Up Recommendations No PT follow up    Equipment Recommendations  Rolling Cybil [place] with 5" wheels;Wheelchair (measurements PT);Wheelchair cushion (measurements PT)    Recommendations for Other Services       Precautions / Restrictions Precautions Precautions: Fall Precaution Comments: wound vac Restrictions Weight Bearing Restrictions: Yes RLE Weight Bearing: Non weight bearing      Mobility  Bed Mobility               General bed mobility comments: sitting EOB on arrival, reports she performed independently    Transfers Overall transfer level: Needs assistance Equipment used: Rolling Simrin Vegh (2 wheeled) Transfers: Sit to/from Stand Sit to Stand: Min guard         General transfer comment: min guard for safety  Ambulation/Gait Ambulation/Gait assistance: Min guard Gait Distance (Feet): 30 Feet Assistive device: Rolling Talon Regala (2 wheeled) Gait Pattern/deviations:  (hop to)     General Gait Details: Patient able to maintain NWB on R throughout ambulation. Min guard for safety but no  physical assist required.  Stairs            Wheelchair Mobility    Modified Rankin (Stroke Patients Only)       Balance Overall balance assessment: Mild deficits observed, not formally tested                                           Pertinent Vitals/Pain Pain Assessment: 0-10 Pain Score: 8  Pain Location: R foot Pain Descriptors / Indicators: Aching Pain Intervention(s): Monitored during session    Home Living Family/patient expects to be discharged to:: Private residence Living Arrangements: Spouse/significant other;Children Available Help at Discharge: Friend(s);Available PRN/intermittently Type of Home: House Home Access: Stairs to enter Entrance Stairs-Rails: Can reach both Entrance Stairs-Number of Steps: 5 Home Layout: One level Home Equipment: Shower seat;Grab bars - tub/shower      Prior Function Level of Independence: Independent               Hand Dominance   Dominant Hand: Right    Extremity/Trunk Assessment   Upper Extremity Assessment Upper Extremity Assessment: Defer to OT evaluation    Lower Extremity Assessment Lower Extremity Assessment: Generalized weakness (B hip flexion 3+/5)       Communication   Communication: No difficulties  Cognition Arousal/Alertness: Awake/alert Behavior During Therapy: WFL for tasks assessed/performed Overall Cognitive Status: Within Functional Limits for tasks assessed  General Comments      Exercises     Assessment/Plan    PT Assessment Patient needs continued PT services  PT Problem List Decreased strength;Decreased activity tolerance;Decreased balance;Decreased mobility       PT Treatment Interventions DME instruction;Gait training;Stair training;Functional mobility training;Therapeutic activities;Therapeutic exercise;Balance training;Patient/family education    PT Goals (Current goals can be found in the Care  Plan section)  Acute Rehab PT Goals Patient Stated Goal: to go home PT Goal Formulation: With patient Time For Goal Achievement: 04/28/20 Potential to Achieve Goals: Good    Frequency Min 3X/week   Barriers to discharge        Co-evaluation               AM-PAC PT "6 Clicks" Mobility  Outcome Measure Help needed turning from your back to your side while in a flat bed without using bedrails?: None Help needed moving from lying on your back to sitting on the side of a flat bed without using bedrails?: None Help needed moving to and from a bed to a chair (including a wheelchair)?: A Little Help needed standing up from a chair using your arms (e.g., wheelchair or bedside chair)?: A Little Help needed to walk in hospital room?: A Little Help needed climbing 3-5 steps with a railing? : A Little 6 Click Score: 20    End of Session Equipment Utilized During Treatment: Gait belt Activity Tolerance: Patient tolerated treatment well Patient left: in chair;with call bell/phone within reach Nurse Communication: Mobility status PT Visit Diagnosis: Unsteadiness on feet (R26.81);Other abnormalities of gait and mobility (R26.89);Muscle weakness (generalized) (M62.81)    Time: 8295-6213 PT Time Calculation (min) (ACUTE ONLY): 17 min   Charges:   PT Evaluation $PT Eval Low Complexity: 1 Low          Abeni Finchum A. Dan Humphreys PT, DPT Acute Rehabilitation Services Pager [phone] Office [phone]   Elissa Lovett 04/14/2020, 12:37 PM

## 2020-04-14 NOTE — Progress Notes (Signed)
PROGRESS NOTE  [name]  DOB: [birth year]  PCP: [name], MD YBO:[id number]  DOA: 04/09/2020  LOS: 4 days   Chief Complaint  Patient presents with  . Wound Infection    Covid +   Brief narrative: [name] is a 30 year old female with PMH significant for DM 1, HTN, CKD 3A, depression/anxiety.   Patient presented to the ED on 1/10 with complaint of progressive right foot pain, swelling and discharge.  Her symptoms started roughly a week ago when a callus on her right foot plantar aspect started having a small ulcer and draining purulent discharge.    Right foot x-ray showed soft tissue swelling with underlying soft tissue gas along the medial aspect of the first metatarsal head, no findings to suggest acute osteomyelitis.   Of note, patient is vaccinated but she got exposed to her husband who had COVID.   On 1/6, which is 4 days prior presentation, patient also tested positive for COVID.   Patient was admitted to hospitalist service. Orthopedic consultation was obtained. MR right foot was obtained which showed soft tissue ulceration lateral aspect forefoot at level of first metatarsal head with adjacent cellulitis, no fluid collection/abscess; but notable patchy T1 marrow signal adjacent to the soft tissue infection consistent with acute osteomyelitis. 1/14, patient underwent first ray amputation of the right foot with local tissue rearrangement for wound closure.  Subjective: Patient was seen and examined this morning.  Sitting up in bed.  Pain controlled. Wound VAC in process of being changed. No respiratory symptoms. Blood pressure running elevated in 180s this morning.  Creatinine up to 1.49 today.  CRP improving.  Hemoglobin low but stable.  Assessment/Plan: Right foot cellulitis/osteomyelitis Diabetic foot ulcer -Presented with low-grade fever, pain, swelling and discharge from right foot great toe.   -MRI right foot findings suggestive of acute osteomyelitis.    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 [name].   -Continue pain management.  Currently getting as needed oxycodone, IV morphine, Flexeril..  Minimize IV pain medicine use.  COVID 19 infection -Not in respite distress.  Completed 3-day course of IV remdesivir.   -Currently respiratory status is stable on room air.   -CRP improving.  Essential hypertension -Currently on carvedilol 6.25 mg p.o. twice daily, Hydralazine 50 mg PO q6h, Isosorbide mononitrate 30 mg p.o. daily. -Blood pressure in last 24 hours as gone up to 180s.  Probably also due to pain.   -I increased Coreg dose to 12.5 mg twice daily today.  Continue to monitor.  May need further adjustment of blood pressure medicine.  Type 1 diabetes mellitus -A1c 8.6 on 1/11. -Currently on Lantus 60 units daily along with sliding scale insulin and Accu-Cheks.  Blood sugar level consistently in target range.  Continue to monitor. Recent Labs  Lab 04/13/20 1032 04/13/20 1139 04/13/20 1644 04/13/20 2018 04/14/20 0708  GLUCAP 121* 150* 137* 156* 129*   Acute on chronic anemia -Baseline hemoglobin was 10.3 from a year ago. -Presented with low hemoglobin of 6.3.  1 unit PRBC transfused.  Hemoglobin today is stable at 7.8.  No other active bleeding noted. -Continue to monitor. Recent Labs    05/25/19 2158 05/27/19 0522 04/10/20 1122 04/10/20 1828 04/11/20 0249 04/12/20 0212 04/13/20 0233 04/14/20 0200  HGB 10.3*   < >  --  8.7* 8.3* 9.0* 7.9* 7.8*  MCV 85.8   < >  --   --  85.4 87.4 87.1 86.5  VITAMINB12  --   --  366  --   --   --   --   --   FOLATE  --   --  34.4  --   --   --   --   --   FERRITIN 207  --  128  --   --   --   --   --   TIBC  --   --  195*  --   --   --   --   --   IRON  --   --  8*  --   --   --   --   --   RETICCTPCT  --   --  0.9  --   --   --   --   --    < > = values in this interval not displayed.   Acute hyponatremia -Sodium running in the range of 130 to 135.   Continue to monitor -Currently on normal saline at 75 mill per hour. Recent Labs  Lab 04/09/20 1522 04/11/20 0249 04/12/20 0212 04/13/20 0233 04/14/20 0200  NA 131* 134* 133* 133* 135   AKI on CKD 3a -Creatinine at baseline from December 2021 was less than 1.3. -Creatinine elevated to 1.49 today.  She is currently on normal saline at 75 mill per hour.  I will continue the same. Recent Labs    03/22/20 1643 03/22/20 2039 03/23/20 0109 03/23/20 0819 03/24/20 0558 04/09/20 1522 04/10/20 0714 04/11/20 0249 04/12/20 0212 04/13/20 0233 04/14/20 0200  BUN 54* 50* 48* 43* 38* 23*  --  19 18 16  21*  CREATININE 4.09* 3.69* 3.76* 3.69* 2.81* 1.28* 1.25* 1.19* 1.11* 1.23* 1.49*   Nausea/ Abdominal Pain -KUB 04/11/2020 with unremarkable bowel gas pattern, prior cholecystectomy with no acute findings.  -Patient was also noted by nursing staff to be self inducing vomiting by sticking her finger down her throat. -Currently on Reglan and Phenergan as needed. -Continue to monitor.  GERD: Continue Protonix 40 mg p.o. twice daily and Carafate 1 g p.o. q6h prn  Depression/anxiety:  -Continue BuSpar 5 mg p.o. 3 times daily and Lorazepam PRN 1 mg IV q6h  Insomnia: Mirtazapine 30 mg p.o. nightly  Mobility: Encourage ambulation.  PT eval pending Code Status:   Code Status: Full Code  Nutritional status: Body mass index is 20.71 kg/m.     Diet Order            Diet Carb Modified Fluid consistency: Thin; Room service appropriate? Yes  Diet effective now                 DVT prophylaxis: SCDs Start: 04/13/20 1106 heparin injection 5,000 Units Start: 04/10/20 2595   Antimicrobials:  IV cefepime, IV vancomycin Fluid: Normal saline at 75 mill per hour Consultants: Orthopedics Family Communication:  None at bedside  Status is: Inpatient   Remains inpatient appropriate because: Underwent surgery today.  Continue to monitor  Dispo: The patient is from: Home               Anticipated d/c is to: Home              Anticipated d/c date is: 3 days              Patient currently is not medically stable to d/c.       Infusions:  . sodium chloride 1,000 mL (04/12/20 1837)  . sodium chloride 75 mL/hr at 04/14/20 0222  . sodium chloride 75 mL/hr at 04/13/20  1110  . ceFEPime (MAXIPIME) IV 2 g (04/14/20 5176)  . vancomycin 1,250 mg (04/14/20 0910)    Scheduled Meds: . sodium chloride   Intravenous Once  . vitamin C  500 mg Oral Daily  . busPIRone  5 mg Oral TID  . carvedilol  12.5 mg Oral BID WC  . collagenase   Topical Daily  . heparin  5,000 Units Subcutaneous Q8H  . hydrALAZINE  50 mg Oral Q6H  . insulin aspart  0-6 Units Subcutaneous TID WC  . insulin glargine  6 Units Subcutaneous Daily  . isosorbide mononitrate  30 mg Oral Daily  . metoCLOPramide (REGLAN) injection  10 mg Intravenous Q8H  . mirtazapine  30 mg Oral QHS  . pantoprazole  40 mg Oral BID  . scopolamine  1 patch Transdermal Q72H  . zinc sulfate  220 mg Oral Daily    Antimicrobials: Anti-infectives (From admission, onward)   Start     Dose/Rate Route Frequency Ordered Stop   04/13/20 1000  vancomycin (VANCOREADY) IVPB 1250 mg/250 mL        1,250 mg 166.7 mL/hr over 90 Minutes Intravenous Every 24 hours 04/13/20 0906     04/13/20 0800  ceFAZolin (ANCEF) IVPB 2g/100 mL premix        2 g 200 mL/hr over 30 Minutes Intravenous To [facility] 04/12/20 2232 04/13/20 0930   04/11/20 1400  ceFEPIme (MAXIPIME) 2 g in sodium chloride 0.9 % 100 mL IVPB        2 g 200 mL/hr over 30 Minutes Intravenous Every 8 hours 04/11/20 1012     04/11/20 1100  vancomycin (VANCOCIN) IVPB 1000 mg/200 mL premix  Status:  Discontinued        1,000 mg 200 mL/hr over 60 Minutes Intravenous Every 12 hours 04/11/20 1012 04/13/20 0906   04/11/20 1000  remdesivir 100 mg in sodium chloride 0.9 % 100 mL IVPB       "Followed by" Linked Group Details   100 mg 200 mL/hr over 30 Minutes Intravenous Daily  04/10/20 0526 04/12/20 1001   04/10/20 1800  ceFEPIme (MAXIPIME) 2 g in sodium chloride 0.9 % 100 mL IVPB  Status:  Discontinued        2 g 200 mL/hr over 30 Minutes Intravenous Every 12 hours 04/10/20 0613 04/11/20 1012   04/10/20 0800  vancomycin (VANCOREADY) IVPB 1250 mg/250 mL  Status:  Discontinued        1,250 mg 166.7 mL/hr over 90 Minutes Intravenous Every 24 hours 04/10/20 0613 04/11/20 1012   04/10/20 0615  remdesivir 200 mg in sodium chloride 0.9% 250 mL IVPB       "Followed by" Linked Group Details   200 mg 580 mL/hr over 30 Minutes Intravenous Once 04/10/20 0526 04/10/20 1043   04/10/20 0615  vancomycin (VANCOCIN) IVPB 1000 mg/200 mL premix  Status:  Discontinued        1,000 mg 200 mL/hr over 60 Minutes Intravenous  Once 04/10/20 0526 04/10/20 0613   04/10/20 0615  ceFEPIme (MAXIPIME) 2 g in sodium chloride 0.9 % 100 mL IVPB        2 g 200 mL/hr over 30 Minutes Intravenous  Once 04/10/20 0526 04/10/20 0711   04/09/20 1515  vancomycin (VANCOCIN) IVPB 1000 mg/200 mL premix        1,000 mg 200 mL/hr over 60 Minutes Intravenous  Once 04/09/20 1500 04/09/20 1818   04/09/20 1515  piperacillin-tazobactam (ZOSYN) IVPB 3.375 g  3.375 g 100 mL/hr over 30 Minutes Intravenous  Once 04/09/20 1500 04/09/20 1622      PRN meds: sodium chloride, acetaminophen **OR** acetaminophen, cyclobenzaprine, dicyclomine, hydrALAZINE, labetalol, LORazepam, morphine injection, oxyCODONE, promethazine, sucralfate   Objective: Vitals:   04/14/20 0142 04/14/20 0913  BP: (!) 185/98 (!) 157/96  Pulse: (!) 106 (!) 101  Resp: 18 18  Temp: 99 F (37.2 C) 98 F (36.7 C)  SpO2: 100% 98%    Intake/Output Summary (Last 24 hours) at 04/14/2020 1009 Last data filed at 04/14/2020 0656 Gross per 24 hour  Intake 1788.98 ml  Output 5 ml  Net 1783.98 ml   Filed Weights   04/09/20 1246  Weight: 60 kg   Weight change:  Body mass index is 20.71 kg/m.   Physical Exam: General exam: Young,  African-American female.  Not in physical distress Skin: No rashes, lesions or ulcers. HEENT: Atraumatic, normocephalic, no obvious bleeding Lungs: Clear to auscultation bilaterally CVS: Regular rate and rhythm, no murmur GI/Abd soft, nontender, nondistended, bowel sound present CNS: Alert, awake, oriented x3.   Psychiatry: Mood appropriate Extremities: Right foot has bandage, wound attached to wound bed.  Data Review: I have personally reviewed the laboratory data and studies available.  Recent Labs  Lab 04/09/20 1522 04/10/20 0714 04/10/20 1828 04/11/20 0249 04/12/20 0212 04/13/20 0233 04/14/20 0200  WBC 7.3 8.1  --  10.1 8.8 9.2 9.6  NEUTROABS 5.1  --   --  8.4* 6.7 6.0 7.3  HGB 7.1* 6.3* 8.7* 8.3* 9.0* 7.9* 7.8*  HCT 22.5* 19.8* 27.5* 24.5* 28.4* 24.3* 23.8*  MCV 87.5 86.1  --  85.4 87.4 87.1 86.5  PLT 239 242  --  282 307 356 401*   Recent Labs  Lab 04/09/20 1522 04/10/20 0714 04/11/20 0249 04/12/20 0212 04/13/20 0233 04/14/20 0200  NA 131*  --  134* 133* 133* 135  K 4.7  --  4.2 3.8 3.7 3.6  CL 98  --  100 101 102 104  CO2 25  --  23 20* 21* 18*  GLUCOSE 94  --  87 240* 210* 147*  BUN 23*  --  19 18 16  21*  CREATININE 1.28* 1.25* 1.19* 1.11* 1.23* 1.49*  CALCIUM 8.4*  --  8.5* 8.7* 8.6* 8.7*    F/u labs ordered.  Signed, Terrilee Croak, MD [facility] 04/14/2020

## 2020-04-14 NOTE — Progress Notes (Signed)
Occupational Therapy Evaluation Patient Details Name: [name] MRN: [id number] DOB: April 28, 1990 Today's Date: 04/14/2020    History of Present Illness Patient is a 30 y/o female with PMH significant for DM type 1, HTN, depression, anxiety. Patient presented to ED with swelling and discharge from R foot. 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 Patient also diagnosed with COVID-19.   Clinical Impression   Prior to hospitalization, pt was independent with ADLs/ADL mobility/IADLs without mobility device. Pt was taking care of her 37 year old child. Pt lives with her spouse and child in a 1 level house with 5 STE. Today, pt received sitting EOB, pt agreeable to OT eval. Currently, pt requires min guard for functional mobility/transfers within room using RW for support, min guard for LB self-care, and setup-mod I for UB self-care. Pt required assistance with line management, however no LOB noted. Educated pt on NWB RLE precautions, RW management, AE/DME, activity pacing, d/c planning, and safety awareness. Pt's husband can provide 24/7 S/A initially upon d/c home. Will continue to follow pt in-house. Pt would benefit from continued skilled acute care OT services to maximize independence with ADLs/ADL mobility and safety. As of now, pt will not require post-acute OT services.     Follow Up Recommendations  No OT follow up;Supervision - Intermittent    Equipment Recommendations  Other (comment) (will continue to assess, RW most likely)    Recommendations for Other Services Other (comment) (None)     Precautions / Restrictions Precautions Precautions: Fall Precaution Comments: wound vac, NWB RLE Restrictions Weight Bearing Restrictions: Yes RLE Weight Bearing: Non weight bearing Other Position/Activity Restrictions: NWB RLE      Mobility Bed Mobility Overal bed mobility: Modified Independent     General bed mobility comments: Mod I to transition to EOB     Transfers Overall transfer level: Needs assistance Equipment used: Rolling walker (2 wheeled) Transfers: Sit to/from Stand Sit to Stand: Min guard    General transfer comment: min guard for safety using RW for support    Balance Overall balance assessment: Mild deficits observed, not formally tested       ADL either performed or assessed with clinical judgement   ADL Overall ADL's : Needs assistance/impaired Eating/Feeding: Independent   Grooming: Wash/dry hands;Wash/dry face;Min guard;Standing Grooming Details (indicate cue type and reason): using RW at sink with min guard Upper Body Bathing: Modified independent   Lower Body Bathing: Min guard;Sitting/lateral leans;Sit to/from stand   Upper Body Dressing : Modified independent   Lower Body Dressing: Min guard;Sitting/lateral leans;Sit to/from stand;Supervision/safety   Toilet Transfer: Min guard;Ambulation;Comfort height toilet;Grab bars;RW   Toileting- Clothing Manipulation and Hygiene: Modified independent;Sitting/lateral lean   Tub/ Banker:  (not assessed)   Functional mobility during ADLs: Min guard;Rolling walker General ADL Comments: no LOB, assistance with line management     Vision Baseline Vision/History: Wears glasses Wears Glasses: Reading only Patient Visual Report: No change from baseline Vision Assessment?: No apparent visual deficits     Perception Perception Perception Tested?: No   Praxis Praxis Praxis tested?: Not tested    Pertinent Vitals/Pain Pain Assessment: 0-10 Pain Score: 8  Pain Location: R foot Pain Descriptors / Indicators: Aching Pain Intervention(s): Monitored during session;Repositioned;Patient requesting pain meds-RN notified     Hand Dominance Right   Extremity/Trunk Assessment Upper Extremity Assessment Upper Extremity Assessment: Overall WFL for tasks assessed   Lower Extremity Assessment Lower Extremity Assessment: Defer to PT evaluation   Cervical /  Trunk  Assessment Cervical / Trunk Assessment: Normal   Communication Communication Communication: No difficulties   Cognition Arousal/Alertness: Awake/alert Behavior During Therapy: WFL for tasks assessed/performed Overall Cognitive Status: Within Functional Limits for tasks assessed     General Comments  dressing in place to RLE, wound vac in place, skin intact otherwise, difficult to assess edema 2/2 dressings      Home Living Family/patient expects to be discharged to:: Private residence Living Arrangements: Spouse/significant other;Children Available Help at Discharge: Friend(s);Available PRN/intermittently Type of Home: House Home Access: Stairs to enter [employer] of Steps: 5 Entrance Stairs-Rails: Can reach both Home Layout: One level     Bathroom Shower/Tub: Tub/shower unit;Walk-in shower (prefers walk in shower)   Bathroom Toilet: Standard Bathroom Accessibility: Yes How Accessible: Accessible via walker Home Equipment: Shower seat;Grab bars - tub/shower          Prior Functioning/Environment Level of Independence: Independent        Comments: Pt is Ind with ADLs, household chores, drives, 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 Presenter, broadcasting is 67YO and in-laws are close by to babysit when needed.        OT Problem List: Decreased activity tolerance;Impaired balance (sitting and/or standing);Pain      OT Treatment/Interventions: Self-care/ADL training;Therapeutic exercise;Neuromuscular education;Energy conservation;DME and/or AE instruction;Therapeutic activities;Patient/family education    OT Goals(Current goals can be found in the care plan section) Acute Rehab OT Goals Patient Stated Goal: to go home OT Goal Formulation: With patient Time For Goal Achievement: 04/28/20 Potential to Achieve Goals: Good  OT Frequency: Min 2X/week    AM-PAC OT "6 Clicks" Daily Activity     Outcome Measure Help from another person eating meals?: None Help from another  person taking care of personal grooming?: A Little Help from another person toileting, which includes using toliet, bedpan, or urinal?: A Little Help from another person bathing (including washing, rinsing, drying)?: A Little Help from another person to put on and taking off regular upper body clothing?: None Help from another person to put on and taking off regular lower body clothing?: A Little 6 Click Score: 20   End of Session Equipment Utilized During Treatment: Gait belt;Rolling walker Nurse Communication: Mobility status  Activity Tolerance: Patient tolerated treatment well Patient left: in chair;with call bell/phone within reach  OT Visit Diagnosis: Unsteadiness on feet (R26.81);Muscle weakness (generalized) (M62.81);Pain Pain - Right/Left: Right Pain - part of body: Ankle and joints of foot                Time: 1610-9604 OT Time Calculation (min): 17 min Charges:  OT General Charges $OT Visit: 1 Visit OT Evaluation $OT Eval Moderate Complexity: 1 Mod  Michel Bickers, OTR/L [facility] [phone]   Francesca Jewett 04/14/2020, 6:04 PM

## 2020-04-14 NOTE — Progress Notes (Signed)
After going to the bathroom, [employer] started to beep.  It has been plugged in and is fully charged.  Tried to reinforce dressing but still beeping.    Contacted the on call.  PA would like a full dressing change.  Charge nurse to order supplies.  Awaiting delivery of material to do a full dressing change.

## 2020-04-15 LAB — GLUCOSE, CAPILLARY
Glucose-Capillary: 169 mg/dL — ABNORMAL HIGH (ref 70–99)
Glucose-Capillary: 180 mg/dL — ABNORMAL HIGH (ref 70–99)
Glucose-Capillary: 153 mg/dL — ABNORMAL HIGH (ref 70–99)
Glucose-Capillary: 51 mg/dL — ABNORMAL LOW (ref 70–99)
Glucose-Capillary: 127 mg/dL — ABNORMAL HIGH (ref 70–99)

## 2020-04-15 LAB — CBC WITH DIFFERENTIAL/PLATELET
Abs Immature Granulocytes: 0.09 10*3/uL — ABNORMAL HIGH (ref 0.00–0.07)
Basophils Absolute: 0 10*3/uL (ref 0.0–0.1)
Basophils Relative: 0 %
Eosinophils Absolute: 0.1 10*3/uL (ref 0.0–0.5)
Eosinophils Relative: 1 %
HCT: 21.6 % — ABNORMAL LOW (ref 36.0–46.0)
Hemoglobin: 7.1 g/dL — ABNORMAL LOW (ref 12.0–15.0)
Immature Granulocytes: 1 %
Lymphocytes Relative: 35 %
Lymphs Abs: 2.5 10*3/uL (ref 0.7–4.0)
MCH: 28.4 pg (ref 26.0–34.0)
MCHC: 32.9 g/dL (ref 30.0–36.0)
MCV: 86.4 fL (ref 80.0–100.0)
Monocytes Absolute: 0.7 10*3/uL (ref 0.1–1.0)
Monocytes Relative: 9 %
Neutro Abs: 3.9 10*3/uL (ref 1.7–7.7)
Neutrophils Relative %: 54 %
Platelets: 368 10*3/uL (ref 150–400)
RBC: 2.5 MIL/uL — ABNORMAL LOW (ref 3.87–5.11)
RDW: 12.7 % (ref 11.5–15.5)
WBC: 7.3 10*3/uL (ref 4.0–10.5)
nRBC: 0 % (ref 0.0–0.2)

## 2020-04-15 LAB — COMPREHENSIVE METABOLIC PANEL
ALT: 9 U/L (ref 0–44)
AST: 16 U/L (ref 15–41)
Albumin: 1.8 g/dL — ABNORMAL LOW (ref 3.5–5.0)
Alkaline Phosphatase: 52 U/L (ref 38–126)
Anion gap: 10 (ref 5–15)
BUN: 17 mg/dL (ref 6–20)
CO2: 19 mmol/L — ABNORMAL LOW (ref 22–32)
Calcium: 8.3 mg/dL — ABNORMAL LOW (ref 8.9–10.3)
Chloride: 106 mmol/L (ref 98–111)
Creatinine, Ser: 1.35 mg/dL — ABNORMAL HIGH (ref 0.44–1.00)
GFR, Estimated: 54 mL/min — ABNORMAL LOW (ref >60)
Glucose, Bld: 68 mg/dL — ABNORMAL LOW (ref 70–99)
Potassium: 3.4 mmol/L — ABNORMAL LOW (ref 3.5–5.1)
Sodium: 135 mmol/L (ref 135–145)
Total Bilirubin: 0.6 mg/dL (ref 0.3–1.2)
Total Protein: 6.9 g/dL (ref 6.5–8.1)

## 2020-04-15 LAB — C-REACTIVE PROTEIN: CRP: 4.2 mg/dL — ABNORMAL HIGH (ref <1.0)

## 2020-04-15 LAB — D-DIMER, QUANTITATIVE: D-Dimer, Quant: 2.39 ug/mL-FEU — ABNORMAL HIGH (ref 0.00–0.50)

## 2020-04-15 MED ORDER — MORPHINE SULFATE (PF) 2 MG/ML IV SOLN
1.0000 mg | Freq: Three times a day (TID) | INTRAVENOUS | Status: DC | PRN
  Administered 2020-04-15 – 2020-04-16 (×2): 1 mg via INTRAVENOUS
  Filled 2020-04-15 (×2): qty 1

## 2020-04-15 MED ORDER — DEXTROSE 50 % IV SOLN
INTRAVENOUS | Status: AC
  Filled 2020-04-15: qty 50

## 2020-04-15 MED ORDER — DEXTROSE 50 % IV SOLN
25.0000 g | INTRAVENOUS | Status: AC
  Administered 2020-04-15: 25 g via INTRAVENOUS

## 2020-04-15 NOTE — Plan of Care (Signed)

## 2020-04-15 NOTE — Plan of Care (Signed)
  Problem: Education: Goal: Knowledge of General Education information will improve Description Including pain rating scale, medication(s)/side effects and non-pharmacologic comfort measures Outcome: Progressing   Problem: Health Behavior/Discharge Planning: Goal: Ability to manage health-related needs will improve Outcome: Progressing   

## 2020-04-15 NOTE — Progress Notes (Signed)
PROGRESS NOTE  [name]  DOB: [birth year]  PCP: [name], MD CBS:[phone]  DOA: 04/09/2020  LOS: 5 days   Chief Complaint  Patient presents with  . Wound Infection    Covid +   Brief narrative: [name] is a 30 year old female with PMH significant for DM 1, HTN, CKD 3A, depression/anxiety.   Patient presented to the ED on 1/10 with complaint of progressive right foot pain, swelling and discharge.  Her symptoms started roughly a week ago when a callus on her right foot plantar aspect started having a small ulcer and draining purulent discharge.    Right foot x-ray showed soft tissue swelling with underlying soft tissue gas along the medial aspect of the first metatarsal head, no findings to suggest acute osteomyelitis.   Of note, patient is vaccinated but she got exposed to her husband who had COVID.   On 1/6, which is 4 days prior presentation, patient also tested positive for COVID.   Patient was admitted to hospitalist service. Orthopedic consultation was obtained. MR right foot was obtained which showed soft tissue ulceration lateral aspect forefoot at level of first metatarsal head with adjacent cellulitis, no fluid collection/abscess; but notable patchy T1 marrow signal adjacent to the soft tissue infection consistent with acute osteomyelitis. 1/14, patient underwent first ray amputation of the right foot with local tissue rearrangement for wound closure.  Subjective: Patient was seen and examined this morning.  Sitting up in bed.  Not in distress.  Pain controlled.   New wound VAC in place.  Functional.   Continues to have no respiratory symptoms. Blood pressure improving compared to previous 24 hours creatinine and CRP improving as well.  Assessment/Plan: Right foot cellulitis/osteomyelitis Diabetic foot ulcer -Presented with low-grade fever, pain, swelling and discharge from right foot great toe.   -MRI right foot findings suggestive of acute  osteomyelitis.   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 [name].   -Currently remains on broad-spectrum antibiotics.  It is not clear from orthopedics already patient needs to continue antibiotics.  We will clarify with orthopedics. -Continue pain management.  Currently getting as needed oxycodone, IV morphine, Flexeril..  Minimize IV pain medicine use.  Change morphine 1 mg IV to every 8 hours PRN.  COVID 19 infection -Not in respiratory distress.  Completed 3-day course of IV remdesivir.   -Currently respiratory status is stable on room air.   -CRP improving. Recent Labs  Lab  0000 04/09/20 1522 04/09/20 1711 04/10/20 0714 04/10/20 1122 04/11/20 0249 04/12/20 0212 04/13/20 0233 04/14/20 0200 04/15/20 0154  WBC  --  7.3  --    < >  --  10.1 8.8 9.2 9.6 7.3  LATICACIDVEN  --  0.6 0.5  --   --   --   --   --   --   --   DDIMER  --   --   --   --   --  3.31* 2.67* 2.72* 2.07* 2.39*  FERRITIN  --   --   --   --  128  --   --   --   --   --   CRP   < >  --  12.6*  --   --  17.6* 17.2* 10.2* 7.1* 4.2*  ALT  --  12  --   --   --  11 11 11 11 9    < > = values in this interval not displayed.  Essential hypertension -Currently blood pressure is controlled on carvedilol 12.5 mg p.o. twice daily, Hydralazine 50 mg PO q6h, Isosorbide mononitrate 30 mg p.o. daily.  Type 1 diabetes mellitus -A1c 8.6 on 1/11. -Uses Humulin at home. -Currently blood sugar is controlled on Lantus 6 units daily along with sliding scale insulin and Accu-Cheks.   Recent Labs  Lab 04/14/20 0708 04/14/20 1112 04/14/20 1710 04/14/20 2015 04/15/20 0648  GLUCAP 129* 126* 154* 122* 169*   Acute on chronic anemia -Baseline hemoglobin was 10.3 from a year ago. -Presented with low hemoglobin of 6.3.  1 unit PRBC transfused.  Hemoglobin today is down at 7.1 compared to 7.8 yesterday.  No active bleeding noted. -Continue to monitor.  Transfuse if less than seven. Recent Labs     05/25/19 2158 05/27/19 0522 04/10/20 1122 04/10/20 1828 04/11/20 0249 04/12/20 0212 04/13/20 0233 04/14/20 0200 04/15/20 0154  HGB 10.3*   < >  --    < > 8.3* 9.0* 7.9* 7.8* 7.1*  MCV 85.8   < >  --   --  85.4 87.4 87.1 86.5 86.4  VITAMINB12  --   --  366  --   --   --   --   --   --   FOLATE  --   --  34.4  --   --   --   --   --   --   FERRITIN 207  --  128  --   --   --   --   --   --   TIBC  --   --  195*  --   --   --   --   --   --   IRON  --   --  8*  --   --   --   --   --   --   RETICCTPCT  --   --  0.9  --   --   --   --   --   --    < > = values in this interval not displayed.   Acute hyponatremia -Sodium running in the range of 130 to 135.  Continue to monitor -Currently on normal saline at 75 mill per hour. Recent Labs  Lab 04/09/20 1522 04/11/20 0249 04/12/20 0212 04/13/20 0233 04/14/20 0200 04/15/20 0154  NA 131* 134* 133* 133* 135 135   AKI on CKD 3a -Creatinine at baseline from December 2021 was less than 1.3. -Creatinine elevated was 1.49 on 1/15.  Started on normal saline at 75 mill per hour creatinine improving, 1.35 today.  Continue to monitor. Recent Labs    03/22/20 2039 03/23/20 0109 03/23/20 0819 03/24/20 0558 04/09/20 1522 04/10/20 0714 04/11/20 0249 04/12/20 0212 04/13/20 0233 04/14/20 0200 04/15/20 0154  BUN 50* 48* 43* 38* 23*  --  19 18 16  21* 17  CREATININE 3.69* 3.76* 3.69* 2.81* 1.28* 1.25* 1.19* 1.11* 1.23* 1.49* 1.35*   Nausea/ Abdominal Pain -KUB 04/11/2020 with unremarkable bowel gas pattern, prior cholecystectomy with no acute findings.  -Patient was noted at one time by nursing staff to be self inducing vomiting by sticking her finger down her throat. -Currently on Reglan and Phenergan as needed. -Continue to monitor.  GERD: Continue Protonix 40 mg p.o. twice daily and Carafate 1 g p.o. q6h prn  Depression/anxiety:  -Continue BuSpar 5 mg p.o. 3 times daily.  I will stop IV Ativan.  Insomnia: Mirtazapine 30 mg p.o.  nightly  Mobility: Encourage ambulation.  PT eval pending Code Status:   Code Status: Full Code  Nutritional status: Body mass index is 20.71 kg/m.     Diet Order            Diet Carb Modified Fluid consistency: Thin; Room service appropriate? Yes  Diet effective now                 DVT prophylaxis: SCDs Start: 04/13/20 1106 heparin injection 5,000 Units Start: 04/10/20 5681   Antimicrobials:  IV cefepime, IV vancomycin Fluid: Normal saline at 75 mill per hour Consultants: Orthopedics Family Communication:  None at bedside  Status is: Inpatient   Remains inpatient appropriate because -low hemoglobin.  Pending orthopedic final recommendations regarding wound care antibiotics.  Dispo: The patient is from: Home              Anticipated d/c is to: Home              Anticipated d/c date is: 1 to 2 days              Patient currently is not medically stable to d/c.  Infusions:  . sodium chloride 1,000 mL (04/12/20 1837)  . sodium chloride 75 mL/hr at 04/14/20 0222  . sodium chloride 75 mL/hr at 04/13/20 1110    Scheduled Meds: . sodium chloride   Intravenous Once  . vitamin C  500 mg Oral Daily  . busPIRone  5 mg Oral TID  . carvedilol  12.5 mg Oral BID WC  . collagenase   Topical Daily  . heparin  5,000 Units Subcutaneous Q8H  . hydrALAZINE  50 mg Oral Q6H  . insulin aspart  0-6 Units Subcutaneous TID WC  . insulin glargine  6 Units Subcutaneous Daily  . isosorbide mononitrate  30 mg Oral Daily  . metoCLOPramide (REGLAN) injection  10 mg Intravenous Q8H  . mirtazapine  30 mg Oral QHS  . pantoprazole  40 mg Oral BID  . scopolamine  1 patch Transdermal Q72H  . zinc sulfate  220 mg Oral Daily    Antimicrobials: Anti-infectives (From admission, onward)   Start     Dose/Rate Route Frequency Ordered Stop   04/13/20 1000  vancomycin (VANCOREADY) IVPB 1250 mg/250 mL  Status:  Discontinued        1,250 mg 166.7 mL/hr over 90 Minutes Intravenous Every 24 hours  04/13/20 0906 04/14/20 1035   04/13/20 0800  ceFAZolin (ANCEF) IVPB 2g/100 mL premix        2 g 200 mL/hr over 30 Minutes Intravenous To [facility] 04/12/20 2232 04/13/20 0930   04/11/20 1400  ceFEPIme (MAXIPIME) 2 g in sodium chloride 0.9 % 100 mL IVPB  Status:  Discontinued        2 g 200 mL/hr over 30 Minutes Intravenous Every 8 hours 04/11/20 1012 04/14/20 1035   04/11/20 1100  vancomycin (VANCOCIN) IVPB 1000 mg/200 mL premix  Status:  Discontinued        1,000 mg 200 mL/hr over 60 Minutes Intravenous Every 12 hours 04/11/20 1012 04/13/20 0906   04/11/20 1000  remdesivir 100 mg in sodium chloride 0.9 % 100 mL IVPB       "Followed by" Linked Group Details   100 mg 200 mL/hr over 30 Minutes Intravenous Daily 04/10/20 0526 04/12/20 1001   04/10/20 1800  ceFEPIme (MAXIPIME) 2 g in sodium chloride 0.9 % 100 mL IVPB  Status:  Discontinued        2 g 200 mL/hr over 30  Minutes Intravenous Every 12 hours 04/10/20 0613 04/11/20 1012   04/10/20 0800  vancomycin (VANCOREADY) IVPB 1250 mg/250 mL  Status:  Discontinued        1,250 mg 166.7 mL/hr over 90 Minutes Intravenous Every 24 hours 04/10/20 0613 04/11/20 1012   04/10/20 0615  remdesivir 200 mg in sodium chloride 0.9% 250 mL IVPB       "Followed by" Linked Group Details   200 mg 580 mL/hr over 30 Minutes Intravenous Once 04/10/20 0526 04/10/20 1043   04/10/20 0615  vancomycin (VANCOCIN) IVPB 1000 mg/200 mL premix  Status:  Discontinued        1,000 mg 200 mL/hr over 60 Minutes Intravenous  Once 04/10/20 0526 04/10/20 0613   04/10/20 0615  ceFEPIme (MAXIPIME) 2 g in sodium chloride 0.9 % 100 mL IVPB        2 g 200 mL/hr over 30 Minutes Intravenous  Once 04/10/20 0526 04/10/20 0711   04/09/20 1515  vancomycin (VANCOCIN) IVPB 1000 mg/200 mL premix        1,000 mg 200 mL/hr over 60 Minutes Intravenous  Once 04/09/20 1500 04/09/20 1818   04/09/20 1515  piperacillin-tazobactam (ZOSYN) IVPB 3.375 g        3.375 g 100 mL/hr over 30  Minutes Intravenous  Once 04/09/20 1500 04/09/20 1622      PRN meds: sodium chloride, acetaminophen **OR** acetaminophen, alum & mag hydroxide-simeth, cyclobenzaprine, dicyclomine, hydrALAZINE, labetalol, morphine injection, oxyCODONE, promethazine, sucralfate   Objective: Vitals:   04/15/20 0650 04/15/20 0932  BP:  (!) 148/82  Pulse:  89  Resp:  16  Temp: 99.5 F (37.5 C) 98.5 F (36.9 C)  SpO2:  98%    Intake/Output Summary (Last 24 hours) at 04/15/2020 1037 Last data filed at 04/15/2020 0508 Gross per 24 hour  Intake 4025.17 ml  Output --  Net 4025.17 ml   Filed Weights   04/09/20 1246  Weight: 60 kg   Weight change:  Body mass index is 20.71 kg/m.   Physical Exam: General exam: Young, African-American female.  Not in physical distress Skin: No rashes, lesions or ulcers. HEENT: Atraumatic, normocephalic, no obvious bleeding Lungs: Clear to auscultation bilaterally.  Not on supplemental oxygen CVS: Regular rate and rhythm, no murmur GI/Abd soft, nontender, nondistended, bowel sound present CNS: Alert, awake, oriented x3.   Psychiatry: Mood appropriate Extremities: Right foot has bandage, wound attached to wound vac.  Data Review: I have personally reviewed the laboratory data and studies available.  Recent Labs  Lab 04/11/20 0249 04/12/20 0212 04/13/20 0233 04/14/20 0200 04/15/20 0154  WBC 10.1 8.8 9.2 9.6 7.3  NEUTROABS 8.4* 6.7 6.0 7.3 3.9  HGB 8.3* 9.0* 7.9* 7.8* 7.1*  HCT 24.5* 28.4* 24.3* 23.8* 21.6*  MCV 85.4 87.4 87.1 86.5 86.4  PLT 282 307 356 401* 368   Recent Labs  Lab 04/11/20 0249 04/12/20 0212 04/13/20 0233 04/14/20 0200 04/15/20 0154  NA 134* 133* 133* 135 135  K 4.2 3.8 3.7 3.6 3.4*  CL 100 101 102 104 106  CO2 23 20* 21* 18* 19*  GLUCOSE 87 240* 210* 147* 68*  BUN 19 18 16  21* 17  CREATININE 1.19* 1.11* 1.23* 1.49* 1.35*  CALCIUM 8.5* 8.7* 8.6* 8.7* 8.3*    F/u labs ordered.  Signed, Terrilee Croak, MD [facility] 04/15/2020

## 2020-04-16 LAB — CBC WITH DIFFERENTIAL/PLATELET
Abs Immature Granulocytes: 0.12 10*3/uL — ABNORMAL HIGH (ref 0.00–0.07)
Basophils Absolute: 0 10*3/uL (ref 0.0–0.1)
Basophils Relative: 0 %
Eosinophils Absolute: 0.1 10*3/uL (ref 0.0–0.5)
Eosinophils Relative: 1 %
HCT: 22.2 % — ABNORMAL LOW (ref 36.0–46.0)
Hemoglobin: 7 g/dL — ABNORMAL LOW (ref 12.0–15.0)
Immature Granulocytes: 2 %
Lymphocytes Relative: 24 %
Lymphs Abs: 2 10*3/uL (ref 0.7–4.0)
MCH: 27.6 pg (ref 26.0–34.0)
MCHC: 31.5 g/dL (ref 30.0–36.0)
MCV: 87.4 fL (ref 80.0–100.0)
Monocytes Absolute: 0.7 10*3/uL (ref 0.1–1.0)
Monocytes Relative: 8 %
Neutro Abs: 5.2 10*3/uL (ref 1.7–7.7)
Neutrophils Relative %: 65 %
Platelets: 369 10*3/uL (ref 150–400)
RBC: 2.54 MIL/uL — ABNORMAL LOW (ref 3.87–5.11)
RDW: 12.5 % (ref 11.5–15.5)
WBC: 8.1 10*3/uL (ref 4.0–10.5)
nRBC: 0 % (ref 0.0–0.2)

## 2020-04-16 LAB — BASIC METABOLIC PANEL
Anion gap: 8 (ref 5–15)
BUN: 11 mg/dL (ref 6–20)
CO2: 22 mmol/L (ref 22–32)
Calcium: 8.2 mg/dL — ABNORMAL LOW (ref 8.9–10.3)
Chloride: 104 mmol/L (ref 98–111)
Creatinine, Ser: 1.27 mg/dL — ABNORMAL HIGH (ref 0.44–1.00)
GFR, Estimated: 58 mL/min — ABNORMAL LOW (ref >60)
Glucose, Bld: 172 mg/dL — ABNORMAL HIGH (ref 70–99)
Potassium: 3.4 mmol/L — ABNORMAL LOW (ref 3.5–5.1)
Sodium: 134 mmol/L — ABNORMAL LOW (ref 135–145)

## 2020-04-16 LAB — GLUCOSE, CAPILLARY
Glucose-Capillary: 156 mg/dL — ABNORMAL HIGH (ref 70–99)
Glucose-Capillary: 135 mg/dL — ABNORMAL HIGH (ref 70–99)
Glucose-Capillary: 85 mg/dL (ref 70–99)

## 2020-04-16 LAB — PREPARE RBC (CROSSMATCH)

## 2020-04-16 MED ORDER — CARVEDILOL 12.5 MG PO TABS
12.5000 mg | ORAL_TABLET | Freq: Two times a day (BID) | ORAL | 2 refills | Status: DC
Start: 2020-04-16 — End: 2020-12-26

## 2020-04-16 MED ORDER — SODIUM CHLORIDE 0.9% IV SOLUTION: Freq: Once | INTRAVENOUS | Status: AC

## 2020-04-16 MED ORDER — POTASSIUM CHLORIDE CRYS ER 20 MEQ PO TBCR
40.0000 meq | EXTENDED_RELEASE_TABLET | Freq: Once | ORAL | Status: AC
  Administered 2020-04-16: 40 meq via ORAL
  Filled 2020-04-16: qty 2

## 2020-04-16 MED ORDER — OXYCODONE HCL 5 MG PO TABS: 5.0000 mg | ORAL_TABLET | Freq: Three times a day (TID) | ORAL | 0 refills | Status: AC | PRN

## 2020-04-16 NOTE — Plan of Care (Signed)
  Problem: Clinical Measurements: Goal: Ability to maintain clinical measurements within normal limits will improve Outcome: Progressing   Problem: Activity: Goal: Risk for activity intolerance will decrease Outcome: Progressing   Problem: Nutrition: Goal: Adequate nutrition will be maintained Outcome: Progressing   Problem: Coping: Goal: Level of anxiety will decrease Outcome: Progressing   Problem: Pain Managment: Goal: General experience of comfort will improve Outcome: Progressing   Problem: Safety: Goal: Ability to remain free from injury will improve Outcome: Progressing   Problem: Skin Integrity: Goal: Risk for impaired skin integrity will decrease Outcome: Progressing   

## 2020-04-16 NOTE — Progress Notes (Addendum)
Provided discharge education/instructions, all questions and concerns addressed, Pt not in distress, switched to Prevena wound vac, no error noted, dressing clean;dry and intact. Per CM, wheelchair will be delivered to her house, waiting on RW to be delivered to room prior to discharge.   [place] delivered to room.  Pt not in distress, discharged home with belongings.

## 2020-04-16 NOTE — Consult Note (Signed)
   [facility] CM Inpatient Consult   04/16/2020  DEVI HOPMAN 1990-10-09 [id number]   [facility] [[facility]]  Accountable Care Organization [ACO] Patient: Art therapist care:  Post hospital follow up  Patient was assessed for [facility] [employer] [[facility]] Care Management for [employer]. Patient was previously active with [facility] Care Management.  Spoke with patient at bedside phone regarding being restarted with [facility] services. Patient is agreeable for [facility] Telephonic RN Care Management follow up.  She is hopeful for home health follow up with wound VAC. PCP: Jacquiline Doe, MD, this provider is listed to do the [facility] calls and appointments. Patient endorses him as PCP.  Plan:  Assign to [facility] RN [facility]. Alerted inpatient TOC RNCM of Care Management Community follow up plan.  Of note, [facility] Care Management services does not replace or interfere with any services that are arranged by inpatient [facility] care management team.   For additional questions or referrals please contact:  Charlesetta Shanks, RN BSN CCM [facility]  [phone] business mobile phone Toll free office [phone]  Fax number: [phone] [email or web address]

## 2020-04-16 NOTE — Progress Notes (Cosign Needed)
    Durable Medical Equipment  (From admission, onward)         Start     Ordered   04/16/20 1433  For home use only DME lightweight manual wheelchair with seat cushion  Once       Comments: Patient suffers from  R Ist ray foot amputation which impairs their ability to perform daily activities lwalking in the home.  A rolling walker will not resolve  issue with performing activities of daily living. A wheelchair will allow patient to safely perform daily activities. Patient is not able to propel themselves in the home using a standard weight wheelchair due to weakness. Patient can self propel in the lightweight wheelchair. Length of need 3 months. Accessories: elevating leg rests (ELRs), wheel locks, extensions and anti-tippers.   04/16/20 1435

## 2020-04-16 NOTE — Progress Notes (Signed)
Physical Therapy Treatment Patient Details Name: [name] MRN: [id number] DOB: 04-16-1990 Today's Date: 04/16/2020    History of Present Illness Patient is a 30 y/o female with PMH significant for DM type 1, HTN, depression, anxiety. Patient presented to ED with swelling and discharge from R foot. 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 Patient also diagnosed with COVID-19.    PT Comments    Pt supine in bed on arrival.  Pt able to move to edge of bed and into standing with supervision.  Focused on stair training in room utilizing curb step.  Issued hand out for stairs at home as unable to simulate in stair trainer as pt is covid positive.  Pt has support from her husband at d/c.  Mild LOB on stairs but able to maintain balance with support.     Follow Up Recommendations  No PT follow up     Equipment Recommendations  Rolling walker with 5" wheels;Wheelchair (measurements PT);Wheelchair cushion (measurements PT)    Recommendations for Other Services       Precautions / Restrictions Precautions Precautions: Fall Precaution Comments: wound vac, NWB RLE Restrictions Weight Bearing Restrictions: Yes RLE Weight Bearing: Non weight bearing Other Position/Activity Restrictions: NWB RLE    Mobility  Bed Mobility Overal bed mobility: Modified Independent             General bed mobility comments: Mod I to transition to EOB  Transfers Overall transfer level: Needs assistance Equipment used: Rolling walker (2 wheeled) Transfers: Sit to/from Stand Sit to Stand: Supervision         General transfer comment: Supervision for safety  Ambulation/Gait Ambulation/Gait assistance: Supervision Gait Distance (Feet): 4 Feet (x2 away from bed and back to bed for stair training.) Assistive device: Rolling walker (2 wheeled) Gait Pattern/deviations:  (hop to pattern.)     General Gait Details: Patient able to maintain NWB on R throughout  ambulation. Min guard for safety but no physical assist required.   Stairs Stairs: Yes Stairs assistance: Min assist Stair Management: No rails;With walker;Backwards Number of Stairs: 4 General stair comments: Performed hop to backward to ascend x 4 stairs.  Practiced on curb and issued hand out to navigate multiple stairs at home in a row.  Pt demonstrated LOB during 3 attempt required min assistance to maintain balance.   Wheelchair Mobility    Modified Rankin (Stroke Patients Only)       Balance Overall balance assessment: Mild deficits observed, not formally tested                                          Cognition Arousal/Alertness: Awake/alert Behavior During Therapy: WFL for tasks assessed/performed Overall Cognitive Status: Within Functional Limits for tasks assessed                                        Exercises      General Comments        Pertinent Vitals/Pain Pain Assessment: 0-10 Pain Score: 8  Pain Location: R foot Pain Descriptors / Indicators: Aching Pain Intervention(s): Monitored during session;Repositioned    Home Living                      Prior Function  PT Goals (current goals can now be found in the care plan section) Acute Rehab PT Goals Patient Stated Goal: to go home Potential to Achieve Goals: Good Progress towards PT goals: Progressing toward goals    Frequency    Min 3X/week      PT Plan Current plan remains appropriate    Co-evaluation              AM-PAC PT "6 Clicks" Mobility   Outcome Measure  Help needed turning from your back to your side while in a flat bed without using bedrails?: None Help needed moving from lying on your back to sitting on the side of a flat bed without using bedrails?: None Help needed moving to and from a bed to a chair (including a wheelchair)?: A Little Help needed standing up from a chair using your arms (e.g., wheelchair  or bedside chair)?: A Little Help needed to walk in hospital room?: A Little Help needed climbing 3-5 steps with a railing? : A Little 6 Click Score: 20    End of Session Equipment Utilized During Treatment: Gait belt Activity Tolerance: Patient tolerated treatment well Patient left: in chair;with call bell/phone within reach Nurse Communication: Mobility status PT Visit Diagnosis: Unsteadiness on feet (R26.81);Other abnormalities of gait and mobility (R26.89);Muscle weakness (generalized) (M62.81)     Time: 5176-1607 PT Time Calculation (min) (ACUTE ONLY): 24 min  Charges:  $Gait Training: 8-22 mins $Therapeutic Activity: 8-22 mins                     [name] , PTA Acute Rehabilitation Services Pager [phone] Office [phone]     [name] [name] 04/16/2020, 3:16 PM

## 2020-04-16 NOTE — Discharge Summary (Signed)
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 Wound Infection    Covid +   Brief narrative: [name] is a 30 year old female with PMH significant for DM 1, HTN, CKD 3A, depression/anxiety.   Patient presented to the ED on 1/10 with complaint of progressive right foot pain, swelling and discharge.  Her symptoms started roughly a week ago when a callus on her right foot plantar aspect started having a small ulcer and draining purulent discharge.    Right foot x-ray showed soft tissue swelling with underlying soft tissue gas along the medial aspect of the first metatarsal head, no findings to suggest acute osteomyelitis.   Of note, patient is vaccinated but she got exposed to her husband who had COVID.   On 1/6, which is 4 days prior presentation, patient also tested positive for COVID.   Patient was admitted to hospitalist service. Orthopedic consultation was obtained. MR right foot was obtained which showed soft tissue ulceration lateral aspect forefoot at level of first metatarsal head with adjacent cellulitis, no fluid collection/abscess; but notable patchy T1 marrow signal adjacent to the soft tissue infection consistent with acute osteomyelitis. 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  Subjective: Patient was seen and examined this morning.  Sitting up in bed.  Not in distress.  Pain controlled.   New wound VAC in place.  Functional.   Feels ready to go home today. Hemoglobin low at 7 today.  Hospital course: Right foot cellulitis/osteomyelitis Diabetic foot ulcer -Presented with low-grade fever, pain, swelling and discharge from right foot great toe.   -MRI right foot findings suggestive of acute osteomyelitis.  She was started on broad-spectrum IV antibiotics. 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 [name].   -Per orthopedics, no antibiotics needed at discharge as he had clear margins per Intra-Op note. -Pain control with oxycodone as needed.  Prescription given for limited supply.  COVID 19 infection -Not in respiratory distress.  Completed 3-day course of IV remdesivir.   -Currently respiratory status is stable on room air.   -CRP improving. Recent Labs  Lab  0000 04/09/20 1522 04/09/20 1711 04/10/20 0714 04/10/20 1122 04/11/20 0249 04/12/20 0212 04/13/20 0233 04/14/20 0200 04/15/20 0154 04/16/20 0157  WBC  --  7.3  --    < >  --  10.1 8.8 9.2 9.6 7.3 8.1  LATICACIDVEN  --  0.6 0.5  --   --   --   --   --   --   --   --   DDIMER  --   --   --   --   --  3.31* 2.67* 2.72* 2.07*  2.39*  --   FERRITIN  --   --   --   --  128  --   --   --   --   --   --   CRP   < >  --  12.6*  --   --  17.6* 17.2* 10.2* 7.1* 4.2*  --   ALT  --  12  --   --   --  11 11 11 11 9   --    < > = values in this interval not displayed.   Essential hypertension -Currently blood pressure is controlled on carvedilol 12.5 mg p.o. twice daily, Hydralazine 50 mg PO q6h, Isosorbide mononitrate 30 mg p.o. daily.  Type 1 diabetes mellitus -A1c 8.6 on 1/11. -Uses Humulin at home. -Resume home regimen at discharge. -Currently blood sugar is  controlled on Lantus 6 units daily along with sliding scale insulin and Accu-Cheks.   Recent Labs  Lab 04/15/20 1720 04/15/20 2124 04/15/20 2153 04/16/20 1040 04/16/20 1210  GLUCAP 153* 51* 127* 156* 135*   Acute on chronic anemia -Baseline hemoglobin was 10.3 from a year ago. -Presented with low hemoglobin of 6.3.  1 unit PRBC transfused.  Hemoglobin improved but still remains low at 7 today.  Will transfuse 1 more unit today.   Recent Labs    05/25/19 2158 05/27/19 0522 04/10/20 1122 04/10/20 1828 04/12/20 0212 04/13/20 0233 04/14/20 0200 04/15/20 0154 04/16/20 0157  HGB 10.3*   < >  --    < > 9.0* 7.9* 7.8* 7.1* 7.0*  MCV 85.8   < >  --    < > 87.4 87.1 86.5 86.4 87.4  VITAMINB12  --   --  366  --   --   --   --   --   --   FOLATE  --   --  34.4  --   --   --   --   --   --   FERRITIN 207  --  128  --   --   --   --   --   --   TIBC  --   --  195*  --   --   --   --   --   --   IRON  --   --  8*  --   --   --   --   --   --   RETICCTPCT  --   --  0.9  --   --   --   --   --   --    < > = values in this interval not displayed.   Acute hyponatremia -Sodium running in the range of 130 to 135.  Continue to monitor -Currently on normal saline at 75 mill per hour. Recent Labs  Lab 04/09/20 1522 04/11/20 0249 04/12/20 0212 04/13/20 0233 04/14/20 0200 04/15/20 0154 04/16/20 0157  NA 131* 134* 133* 133* 135 135 134*   AKI on CKD 3a -Creatinine at baseline from December 2021 was less than 1.3. -Creatinine elevated was 1.49 on 1/15.  Improving with IV fluid. Recent Labs    03/23/20 0109 03/23/20 0819 03/24/20 0558 04/09/20 1522 04/10/20 0714 04/11/20 0249 04/12/20 0212 04/13/20 0233 04/14/20 0200 04/15/20 0154 04/16/20 0157  BUN 48* 43* 38* 23*  --  19 18 16  21* 17 11  CREATININE 3.76* 3.69* 2.81* 1.28* 1.25* 1.19* 1.11* 1.23* 1.49* 1.35* 1.27*   Nausea/ Abdominal Pain -KUB 04/11/2020 with  unremarkable bowel gas pattern, prior cholecystectomy with no acute  findings.  -Patient was noted at one time by nursing staff to be self inducing vomiting by sticking her finger down her throat. -Currently on Reglan and Phenergan as needed. -Continue to monitor.  GERD: Continue Protonix 40 mg p.o. twice daily and Carafate 1 g p.o. q6h prn  Depression/anxiety:  -Continue BuSpar 5 mg p.o. 3 times daily.   Insomnia: Mirtazapine 30 mg p.o. nightly  Stable for discharge to home with wound VAC   Wound care: Wound / Incision (Open or Dehisced) 04/10/20 Diabetic ulcer Foot Right;Posterior (Active)  Date First Assessed/Time First Assessed: 04/10/20 1025   Wound Type: Diabetic ulcer  Location: Foot  Location Orientation: Right;Posterior  Present on Admission: Yes    Assessments 04/10/2020  1:00 AM 04/16/2020 12:00 PM  Dressing Type Gauze (Comment);Moist to dry Negative pressure wound therapy  Dressing Changed Changed --  Dressing Status Clean;Dry;Intact Clean;Dry;Intact  Site / Wound Assessment Pink;Yellow;Painful Dressing in place / Unable to assess  Wound Length (cm) 1.5 cm --  Wound Width (cm) 3 cm --  Wound Depth (cm) 0 cm --  Wound Volume (cm^3) 0 cm^3 --  Wound Surface Area (cm^2) 4.5 cm^2 --  Tunneling (cm) 0 --  Undermining (cm) 0 --  Margins Unattached edges (unapproximated) --  Drainage Amount Scant None  Drainage Description Purulent --     No Linked orders to display     Negative Pressure Wound Therapy Foot Right (Active)  Placement Date/Time: 04/13/20 0928   Wound Type: Surgical (Open wound)  Location: Foot  Location Orientation: Right    Assessments 04/13/2020 10:23 AM 04/16/2020 12:00 PM  Site / Wound Assessment Dressing in place / Unable to assess Dressing in place / Unable to assess  Cycle On;Continuous Continuous  Target Pressure (mmHg) 125 125  Canister Changed -- No  Dressing Status Intact Intact  Drainage Amount None None  Output (mL) 0 mL --     No Linked orders to display    Discharge Exam:   Vitals:   04/16/20 0858  04/16/20 1124 04/16/20 1158 04/16/20 1225  BP: (!) 164/98 (!) 147/84 (!) 142/87 (!) 141/88  Pulse: 93 87 85 86  Resp: 17 17 16 16   Temp: 98.8 F (37.1 C) 98.7 F (37.1 C) 98.4 F (36.9 C) 97.9 F (36.6 C)  TempSrc: Oral Oral Oral Oral  SpO2:  98% 100% 100%  Weight:      Height:        Body mass index is 20.71 kg/m.  General exam: Pleasant young African-American female.  Not in distress Skin: No rashes, lesions or ulcers. HEENT: Atraumatic, normocephalic, no obvious bleeding Lungs: Clear to auscultation bilaterally CVS: Regular rate and rhythm, no murmur GI/Abd soft, nontender, nondistended, bowel sound present CNS: Alert, awake, oriented x3 Psychiatry: Mood appropriate Extremities: No pedal edema, no calf tenderness, right foot with bandage, attached to wound VAC.  Follow ups:   Discharge Instructions    Diet Carb Modified   Complete by: As directed    Discharge wound care:   Complete by: As directed    Wound VAC   Increase activity slowly   Complete by: As directed    Negative Pressure Wound Therapy - Incisional   Complete by: As directed    Show patient how to attach prevena vac. Should call office if alarms      Follow-up Information    Persons, Bevely Palmer, [place] In 1 week.   Specialty: Orthopedic Surgery  Contact information: [place] [address] [phone]        [name], MD Follow up.   Specialty: Family Medicine Contact information: [address] [place] [address] [phone]               Recommendations for Outpatient Follow-Up:   1. Follow-up with PCP as an outpatient 2. Follow-up with orthopedics as an outpatient  Discharge Instructions:  Follow with Primary MD [name], MD in 7 days   Get CBC/BMP checked in next visit within 1 week by PCP or SNF MD ( we routinely change or add medications that can affect your baseline labs and fluid status, therefore we recommend that you get the mentioned basic  workup next visit with your PCP, your PCP may decide not to get them or add new tests based on their clinical decision)  On your next visit with your PCP, please Get Medicines reviewed and adjusted.  Please request your PCP  to go over all Hospital Tests and Procedure/Radiological results at the follow up, please get all Hospital records sent to your Prim MD by signing hospital release before you go home.  Activity: As tolerated with Full fall precautions use walker/cane & assistance as needed  For Heart failure patients - Check your Weight same time everyday, if you gain over 2 pounds, or you develop in leg swelling, experience more shortness of breath or chest pain, call your Primary MD immediately. Follow Cardiac Low Salt Diet and 1.5 lit/day fluid restriction.  If you have smoked or chewed Tobacco in the last 2 yrs please stop smoking, stop any regular Alcohol  and or any Recreational drug use.  If you experience worsening of your admission symptoms, develop shortness of breath, life threatening emergency, suicidal or homicidal thoughts you must seek medical attention immediately by calling 911 or calling your MD immediately  if symptoms less severe.  You Must read complete instructions/literature along with all the possible adverse reactions/side effects for all the Medicines you take and that have been prescribed to you. Take any new Medicines after you have completely understood and accpet all the possible adverse reactions/side effects.   Do not drive, operate heavy machinery, perform activities at heights, swimming or participation in water activities or provide baby sitting services if your were admitted for syncope or siezures until you have seen by Primary MD or a Neurologist and advised to do so again.  Do not drive when taking Pain medications.  Do not take more than prescribed Pain, Sleep and Anxiety Medications  Wear Seat belts while driving.   Please note You were cared for  by a hospitalist during your hospital stay. If you have any questions about your discharge medications or the care you received while you were in the hospital after you are discharged, you can call the unit and asked to speak with the hospitalist on call if the hospitalist that took care of you is not available. Once you are discharged, your primary care physician will handle any further medical issues. Please note that NO REFILLS for any discharge medications will be authorized once you are discharged, as it is imperative that you return to your primary care physician (or establish a relationship with a primary care physician if you do not have one) for your aftercare needs so that they can reassess your need for medications and monitor your lab values.    Allergies as of 04/16/2020      Reactions  Other Anaphylaxis   Reaction to [place] nuts   Lactose Intolerance (gi) Diarrhea      Medication List    TAKE these medications   aluminum-magnesium hydroxide-simethicone 300-762-26 MG/5ML Susp Commonly known as: MAALOX Take 30 mLs by mouth 3 (three) times daily as needed (indigestion).   busPIRone 5 MG tablet Commonly known as: BUSPAR Take 1 tablet (5 mg total) by mouth 3 (three) times daily.   carvedilol 12.5 MG tablet Commonly known as: COREG Take 1 tablet (12.5 mg total) by mouth 2 (two) times daily with a meal. What changed:   medication strength  how much to take   cyclobenzaprine 10 MG tablet Commonly known as: FLEXERIL TAKE 1 TABLET(10 MG) BY MOUTH THREE TIMES DAILY AS NEEDED FOR MUSCLE SPASMS   Dexcom G6 Sensor Misc USE AS DIRECTED WITH CONTINUOUS BLOOD GLUCOSE MONITOR. REPLACE EVERY 10 DAYS   Dexcom G6 Transmitter Misc USE AS DIRECTED TO  MONITOR  GLUCOSE  LEVELS   dicyclomine 20 MG tablet Commonly known as: BENTYL Take 1 tablet (20 mg total) by mouth every 8 (eight) hours as needed for spasms.   HumuLIN N KwikPen 100 UNIT/ML Kiwkpen Generic drug: Insulin NPH (Human)  (Isophane) Inject 20 Units into the skin every morning.   hydrALAZINE 50 MG tablet Commonly known as: APRESOLINE TAKE 1 TABLET(50 MG) BY MOUTH EVERY 6 HOURS What changed: See the new instructions.   isosorbide mononitrate 30 MG 24 hr tablet Commonly known as: IMDUR Take 30 mg by mouth daily.   loperamide 2 MG capsule Commonly known as: IMODIUM Take 6 mg by mouth daily as needed for diarrhea or loose stools.   LORazepam 0.5 MG tablet Commonly known as: ATIVAN TAKE 1 TABLET(0.5 MG) BY MOUTH TWICE DAILY AS NEEDED FOR ANXIETY What changed: See the new instructions.   metoCLOPramide 5 MG tablet Commonly known as: REGLAN Take 1 tablet (5 mg total) by mouth in the morning and at bedtime. Please keep your January appt for further refills. Thank you   mirtazapine 30 MG tablet Commonly known as: REMERON TAKE 1 TABLET BY MOUTH AT  BEDTIME   NyQuil Severe Cold/Flu 5-6.25-10-325 MG/15ML Liqd Generic drug: Phenyleph-Doxylamine-DM-APAP Take 15 mLs by mouth every 4 (four) hours as needed (cold symptoms & fever).   ondansetron 4 MG disintegrating tablet Commonly known as: Zofran ODT Take 1 tablet (4 mg total) by mouth every 8 (eight) hours as needed for nausea or vomiting.   oxyCODONE 5 MG immediate release tablet Commonly known as: Oxy IR/ROXICODONE Take 1 tablet (5 mg total) by mouth every 8 (eight) hours as needed for up to 5 days for moderate pain.   pantoprazole 40 MG tablet Commonly known as: PROTONIX Take 1 tablet (40 mg total) by mouth 2 (two) times daily.   promethazine 25 MG tablet Commonly known as: PHENERGAN Take 1 tablet (25 mg total) by mouth every 6 (six) hours as needed for nausea or vomiting.   promethazine 25 MG suppository Commonly known as: PHENERGAN Place 1 suppository (25 mg total) rectally every 6 (six) hours as needed for nausea or vomiting.   scopolamine 1 MG/3DAYS Commonly known as: TRANSDERM-SCOP Place 1 patch onto the skin every 3 (three) days.    sucralfate 1 GM/10ML suspension Commonly known as: Carafate Take 10 mLs (1 g total) by mouth every 6 (six) hours as needed.            Discharge Care Instructions  (From admission, onward)  Start     Ordered   04/16/20 0000  Discharge wound care:       Comments: Wound VAC   04/16/20 1319          Time coordinating discharge: 35 minutes  The results of significant diagnostics from this hospitalization (including imaging, microbiology, ancillary and laboratory) are listed below for reference.    Procedures and Diagnostic Studies:   MR FOOT RIGHT WO CONTRAST  Result Date: 04/10/2020 CLINICAL DATA:  Diabetic foot ulcer adjacent to the first MTP joint. A EXAM: MRI OF THE RIGHT FOREFOOT WITHOUT CONTRAST TECHNIQUE: Multiplanar, multisequence MR imaging of the right forefoot was performed. No intravenous contrast was administered. COMPARISON:  X-ray 04/09/2020 FINDINGS: Bones/Joint/Cartilage No acute fracture. No malalignment. There is subtle marrow edema within the medial hallux sesamoid (series 4, image 22) with patchy low T1 marrow signal (series 3, image 22). Elsewhere, no additional sites of bone marrow edema within the right forefoot. No bony erosion or periostitis. Small first MTP joint effusion. Ligaments Intact Lisfranc ligament. Collateral ligaments of the forefoot intact. Muscles and Tendons Mild atrophy of the intrinsic foot musculature suggesting chronic denervation changes. Intact flexor and extensor tendons. Trace tenosynovial fluid within the flexor hallucis longus tendon sheath just proximal to the first MTP joint. Soft tissues Soft tissue ulceration overlying the lateral aspect of the forefoot at the level of the first metatarsal head. There is mild soft tissue edema surrounding. No organized fluid collection. There is nonspecific subcutaneous edema throughout the dorsum of the forefoot. IMPRESSION: 1. Soft tissue ulceration overlying the lateral aspect of the  forefoot at the level of the first metatarsal head with adjacent cellulitis. No organized fluid collection/abscess. 2. Subtle marrow edema within the medial hallux sesamoid with patchy low T1 marrow signal. Findings are favored to represent acute osteomyelitis given the proximity of adjacent soft tissue infection. Sequela of chronic sesamoiditis could have a similar appearance, although is felt to be less likely. 3. Small first MTP joint effusion, which may be reactive or reflect septic arthritis. 4. Trace tenosynovial fluid within the flexor hallucis longus tendon sheath just proximal to the first MTP joint, suggesting a mild tenosynovitis which could be reactive or infectious. Electronically Signed   By: Davina Poke D.O.   On: 04/10/2020 09:46   DG Chest Portable 1 View  Result Date: 04/09/2020 CLINICAL DATA:  COVID positive with wound infection of the foot. EXAM: PORTABLE CHEST 1 VIEW COMPARISON:  Multiple priors including CTA chest abdomen pelvis March 20, 2020 and chest radiograph January 19, 2020. FINDINGS: The heart size and mediastinal contours are within normal limits. Both lungs are clear. The visualized skeletal structures are unremarkable. IMPRESSION: No active disease. Electronically Signed   By: Dahlia Bailiff MD   On: 04/09/2020 17:16   DG Foot Complete Right  Result Date: 04/09/2020 CLINICAL DATA:  Wound to right great toe EXAM: RIGHT FOOT COMPLETE - 3+ VIEW COMPARISON:  None. FINDINGS: Soft tissue swelling with underlying soft tissue gas along the medial aspect of the 1st metatarsal head. No underlying cortical irregularity. No fracture or dislocation is seen. The joint spaces are preserved. Postsurgical changes involving the distal tibia and fibula. IMPRESSION: Soft tissue swelling with underlying soft tissue gas along the medial aspect of the 1st metatarsal head. No radiographic findings to suggests acute osteomyelitis. Electronically Signed   By: Julian Hy M.D.   On:  04/09/2020 15:37     Labs:   Basic Metabolic Panel: Recent Labs  Lab 04/12/20 [phone]  04/13/20 0233 04/14/20 0200 04/15/20 0154 04/16/20 0157  NA 133* 133* 135 135 134*  K 3.8 3.7 3.6 3.4* 3.4*  CL 101 102 104 106 104  CO2 20* 21* 18* 19* 22  GLUCOSE 240* 210* 147* 68* 172*  BUN 18 16 21* 17 11  CREATININE 1.11* 1.23* 1.49* 1.35* 1.27*  CALCIUM 8.7* 8.6* 8.7* 8.3* 8.2*   GFR Estimated Creatinine Clearance: 61.4 mL/min (A) (by C-G formula based on SCr of 1.27 mg/dL (H)). Liver Function Tests: Recent Labs  Lab 04/11/20 0249 04/12/20 0212 04/13/20 0233 04/14/20 0200 04/15/20 0154  AST 16 15 15 15 16   ALT 11 11 11 11 9   ALKPHOS 78 76 73 75 52  BILITOT 0.2* 0.8 0.8 1.1 0.6  PROT 7.7 8.1 7.7 7.7 6.9  ALBUMIN 2.1* 2.0* 2.0* 2.1* 1.8*   No results for input(s): LIPASE, AMYLASE in the last 168 hours. No results for input(s): AMMONIA in the last 168 hours. Coagulation profile No results for input(s): INR, PROTIME in the last 168 hours.  CBC: Recent Labs  Lab 04/12/20 0212 04/13/20 0233 04/14/20 0200 04/15/20 0154 04/16/20 0157  WBC 8.8 9.2 9.6 7.3 8.1  NEUTROABS 6.7 6.0 7.3 3.9 5.2  HGB 9.0* 7.9* 7.8* 7.1* 7.0*  HCT 28.4* 24.3* 23.8* 21.6* 22.2*  MCV 87.4 87.1 86.5 86.4 87.4  PLT 307 356 401* 368 369   Cardiac Enzymes: No results for input(s): CKTOTAL, CKMB, CKMBINDEX, TROPONINI in the last 168 hours. BNP: Invalid input(s): POCBNP CBG: Recent Labs  Lab 04/15/20 1720 04/15/20 2124 04/15/20 2153 04/16/20 1040 04/16/20 1210  GLUCAP 153* 51* 127* 156* 135*   D-Dimer Recent Labs    04/14/20 0200 04/15/20 0154  DDIMER 2.07* 2.39*   Hgb A1c No results for input(s): HGBA1C in the last 72 hours. Lipid Profile No results for input(s): CHOL, HDL, LDLCALC, TRIG, CHOLHDL, LDLDIRECT in the last 72 hours. Thyroid function studies No results for input(s): TSH, T4TOTAL, T3FREE, THYROIDAB in the last 72 hours.  Invalid input(s): FREET3 Anemia work up No  results for input(s): VITAMINB12, FOLATE, FERRITIN, TIBC, IRON, RETICCTPCT in the last 72 hours. Microbiology Recent Results (from the past 240 hour(s))  Culture, blood (routine x 2)     Status: None   Collection Time: 04/09/20  3:27 PM   Specimen: BLOOD  Result Value Ref Range Status   Specimen Description   Final    BLOOD LEFT ANTECUBITAL Performed at [facility], [address] [place], [place] [address]    Special Requests   Final    BOTTLES DRAWN AEROBIC AND ANAEROBIC Blood Culture adequate volume Performed at [facility], [address] [place], [place] [address]    Culture   Final    NO GROWTH 5 DAYS Performed at [facility], [place] [address] [place], [place] [address]    Report Status 04/14/2020 FINAL  Final  Culture, blood (routine x 2)     Status: None   Collection Time: 04/09/20  4:07 PM   Specimen: BLOOD  Result Value Ref Range Status   Specimen Description   Final    BLOOD RIGHT ANTECUBITAL Performed at [facility], [address] [place], [place] [address]    Special Requests   Final    BOTTLES DRAWN AEROBIC AND ANAEROBIC Blood Culture adequate volume Performed at [facility], [address], [place], [place] [address]    Culture   Final  NO GROWTH 5 DAYS Performed at [facility], [place] [address] [place], [place] [address]    Report Status 04/14/2020 FINAL  Final  Surgical pcr screen     Status: Abnormal   Collection Time: 04/12/20 10:56 PM   Specimen: Nasal Mucosa; Nasal Swab  Result Value Ref Range Status   MRSA, PCR NEGATIVE NEGATIVE Final   Staphylococcus aureus POSITIVE (A) NEGATIVE Final    Comment: (NOTE) The Xpert SA Assay (FDA approved for NASAL specimens in patients 74 years of age and older), is one component of a comprehensive surveillance program. It is not intended to diagnose infection nor to guide or monitor treatment. Performed at [facility], [place] [address],  [place], [place] [address]      Signed: Terrilee Croak  [facility] 04/16/2020, 1:21 PM

## 2020-04-16 NOTE — TOC Transition Note (Addendum)
Transition of Care [facility]) - CM/SW Discharge Note   Patient Details  Name: [name] MRN: [id number] Date of Birth: May 16, 1990  Transition of Care [facility]) CM/SW Contact:  Sharin Mons, RN Phone Number: 04/16/2020, 1:47 PM   Clinical Narrative:    Patient will DC to: home  Anticipated DC date: 04/16/2020 Family notified: yes Transport by: car  Admitted with R foot diabetic ulcer. Hx of  diabetes mellitus type 1, hypertension and depression and anxiety. Recent admit 1/22-1-25 for DKA. From with husband.        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 RN, patient, and patient's husband notified of DC. Pt with high readmission score.... [facility] RN to follow up telephonically.  Pt states PTA independent with ADL's, no dme usage. DME needs noted per therapist : rolling walker, W/C .... orders noted.  Referral made with [employer]. Rolling walker  will be delivered to bedside prior to d/c. W/C will be delivered to pt's home by [employer] once in stock, NCM made pt aware.  Pt without Rx med concerns or affordability issues. Post hospital f/u noted on AVS...    RNCM will sign off for now as intervention is no longer needed. Please consult [place] again if new needs arise.      Barriers to Discharge: Continued Medical Work up   Patient Goals and CMS Choice        Discharge Placement                       Discharge Plan and Services   Discharge Planning Services: CM Consult            DME Arranged: N/A DME Agency: NA         HH Agency: NA        Social Determinants of Health (SDOH) Interventions     Readmission Risk Interventions Readmission Risk Prevention Plan 03/22/2020 09/26/2019 07/30/2018  Transportation Screening Complete Complete Complete  PCP or Specialist Appt within 3-5 Days - - Complete  HRI or Home Care Consult - - Not Complete  HRI or Home Care Consult comments - - NA  Social Work  Consult for [place] Planning/Counseling - - Not Complete  SW consult not completed comments - - NA  Palliative Care Screening - - Not Applicable  Medication Review Press photographer) Complete - (No Data)  PCP or Specialist appointment within 3-5 days of discharge Complete Complete -  [place] or Home Care Consult Complete Complete -  SW Recovery Care/Counseling Consult Complete Complete -  Palliative Care Screening Not Applicable Not Applicable -  [place] Not Applicable Not Applicable -  Some recent data might be hidden

## 2020-04-16 NOTE — Plan of Care (Signed)

## 2020-04-17 ENCOUNTER — Other Ambulatory Visit: Payer: Self-pay | Admitting: *Deleted

## 2020-04-17 ENCOUNTER — Other Ambulatory Visit: Payer: Self-pay

## 2020-04-17 ENCOUNTER — Telehealth: Payer: Self-pay

## 2020-04-17 ENCOUNTER — Encounter (HOSPITAL_COMMUNITY): Payer: Self-pay | Admitting: Orthopedic Surgery

## 2020-04-17 DIAGNOSIS — E1059 Type 1 diabetes mellitus with other circulatory complications: Secondary | ICD-10-CM

## 2020-04-17 LAB — TYPE AND SCREEN
ABO/RH(D): O POS
Antibody Screen: NEGATIVE
Unit division: 0
Unit division: 0

## 2020-04-17 LAB — BPAM RBC
Blood Product Expiration Date: 202202162359
Blood Product Expiration Date: 202202162359
ISSUE DATE / TIME: 202201171109
ISSUE DATE / TIME: 202201171141
Unit Type and Rh: 5100
Unit Type and Rh: 5100

## 2020-04-17 NOTE — Telephone Encounter (Signed)
I called and lm on vm for Monica to advise that the pt had a 1st ray amputation 04/13/20 and that she had a disposable vac applied and this will stay intact until the appt on 04/23/20. This will be removed at visit and will not be reapplied. To call with any questions.

## 2020-04-17 NOTE — Patient Outreach (Signed)
[facility]) Care Management  [facility] Care Manager  04/18/2020   EVONDA ENGE 1990-06-16 [id number]  Referral Date: 1/18 Referral Source: hospital liaison Referral Reason: post hospital discharge for toe amputation Insurance: [facility]   Outreach attempt #1, successful.  Identity verified.  This care manager introduced self and stated purpose of call.  [facility] care management services explained.    Social: Lives with husband and daughter.  She typically was independent until recent surgery.  She is now unable to bear weight on her right foot, making it difficult to ambulate using walker.  A wheelchair was ordered, conference call placed to [employer], will be delivered tomorrow.  Husband and other family are working to help with ADL's and IAD's, including meal preparations.  State she does not eat out much, usually try to adhere to diabetic diet.    Admits that she and husband at times find it difficulty to pay bills, living paycheck to paycheck.  She has not been able to hold a job due to her medical conditions.  Not on disability but interested in applying for it.    Conditions: Per chart, has history of HTN, DM ( A1C - 8.6), gastroparesis, GERD, cannabinoid hyperemesis syndrome, CKD, and osteomyelitis status post right great toe amputation.  She has wound vac in place, does not have home health ordered.  Advised per ortho office that wound vac is disposable, no dressing changes needed.  Monitors blood sugars multiple times a day, state it is usually in the low 100's.  Denies any hyperglycemic episodes.  She is aware of importance glucose management is in wound healing.  Medications: Reviewed with member, state she is taking as instructed with the exception of her Humulin.  State there are times when she does not eat due to her gastroparesis.  Due to this, she is afraid to take the Humulin, fearful it will drop her blood sugar to quick.  During these times, she will take Lantus instead.  She  does not see endocrinologist, will discuss with PCP this week.  Appointments: Follow up appointments scheduled with PCP on 1/21 and ortho on 1/24.  Husband will provide transportation to appointments.   Advance Directives: Does not have advanced directive or POA, receptive to paperwork to complete.   Consent: Agrees to [facility] program.  Denies any urgent concerns, encouraged to contact this care manager with questions.    Encounter Medications:  Outpatient Encounter Medications as of 04/17/2020  Medication Sig  . aluminum-magnesium hydroxide-simethicone (MAALOX) 200-200-20 MG/5ML SUSP Take 30 mLs by mouth 3 (three) times daily as needed (indigestion).  . busPIRone (BUSPAR) 5 MG tablet Take 1 tablet (5 mg total) by mouth 3 (three) times daily.  . carvedilol (COREG) 12.5 MG tablet Take 1 tablet (12.5 mg total) by mouth 2 (two) times daily with a meal.  . Continuous Blood Gluc Sensor (DEXCOM G6 SENSOR) MISC USE AS DIRECTED WITH CONTINUOUS BLOOD GLUCOSE MONITOR. REPLACE EVERY 10 DAYS  . Continuous Blood Gluc Transmit (DEXCOM G6 TRANSMITTER) MISC USE AS DIRECTED TO  MONITOR  GLUCOSE  LEVELS  . cyclobenzaprine (FLEXERIL) 10 MG tablet TAKE 1 TABLET(10 MG) BY MOUTH THREE TIMES DAILY AS NEEDED FOR MUSCLE SPASMS  . dicyclomine (BENTYL) 20 MG tablet Take 1 tablet (20 mg total) by mouth every 8 (eight) hours as needed for spasms.  . hydrALAZINE (APRESOLINE) 50 MG tablet TAKE 1 TABLET(50 MG) BY MOUTH EVERY 6 HOURS (Patient taking differently: Take 50 mg by mouth every 6 (six) hours.)  .  Insulin NPH, Human,, Isophane, (HUMULIN N KWIKPEN) 100 UNIT/ML Kiwkpen Inject 20 Units into the skin every morning.  . isosorbide mononitrate (IMDUR) 30 MG 24 hr tablet Take 30 mg by mouth daily.  Marland Kitchen loperamide (IMODIUM) 2 MG capsule Take 6 mg by mouth daily as needed for diarrhea or loose stools.   Marland Kitchen LORazepam (ATIVAN) 0.5 MG tablet TAKE 1 TABLET(0.5 MG) BY MOUTH TWICE DAILY AS NEEDED FOR ANXIETY (Patient taking differently:  Take 0.5 mg by mouth 2 (two) times daily as needed for anxiety.)  . metoCLOPramide (REGLAN) 5 MG tablet Take 1 tablet (5 mg total) by mouth in the morning and at bedtime. Please keep your January appt for further refills. Thank you  . mirtazapine (REMERON) 30 MG tablet TAKE 1 TABLET BY MOUTH AT  BEDTIME (Patient taking differently: Take 30 mg by mouth at bedtime.)  . ondansetron (ZOFRAN ODT) 4 MG disintegrating tablet Take 1 tablet (4 mg total) by mouth every 8 (eight) hours as needed for nausea or vomiting.  Marland Kitchen oxyCODONE (OXY IR/ROXICODONE) 5 MG immediate release tablet Take 1 tablet (5 mg total) by mouth every 8 (eight) hours as needed for up to 5 days for moderate pain.  . pantoprazole (PROTONIX) 40 MG tablet Take 1 tablet (40 mg total) by mouth 2 (two) times daily.  Marland Kitchen Phenyleph-Doxylamine-DM-APAP (NYQUIL SEVERE COLD/FLU) 5-6.25-10-325 MG/15ML LIQD Take 15 mLs by mouth every 4 (four) hours as needed (cold symptoms & fever).  . promethazine (PHENERGAN) 25 MG suppository Place 1 suppository (25 mg total) rectally every 6 (six) hours as needed for nausea or vomiting.  . promethazine (PHENERGAN) 25 MG tablet Take 1 tablet (25 mg total) by mouth every 6 (six) hours as needed for nausea or vomiting.  Marland Kitchen scopolamine (TRANSDERM-SCOP) 1 MG/3DAYS Place 1 patch onto the skin every 3 (three) days.  . sucralfate (CARAFATE) 1 GM/10ML suspension Take 10 mLs (1 g total) by mouth every 6 (six) hours as needed.  . [DISCONTINUED] potassium chloride SA (KLOR-CON) 20 MEQ tablet Take 1 tablet (20 mEq total) by mouth daily for 5 doses.   No facility-administered encounter medications on file as of 04/17/2020.    Functional Status:  In your present state of health, do you have any difficulty performing the following activities: 04/10/2020 03/22/2020  Hearing? N N  Vision? N N  Difficulty concentrating or making decisions? N N  Walking or climbing stairs? Y Y  Comment - secondary to back pain  Dressing or bathing? N N   Doing errands, shopping? N N  Some recent data might be hidden    Fall/Depression Screening: Fall Risk  01/16/2015 01/12/2015 01/09/2015  Falls in the past year? No No No   PHQ 2/9 Scores 04/17/2020 02/03/2020 05/31/2018 04/03/2017 01/16/2015 01/09/2015 01/05/2015  PHQ - 2 Score 5 2 2 3  0 0 0  PHQ- 9 Score 19 5 16 10  - - -  Exception Documentation (No Data) - - - - - -    Assessment:  Goals Addressed            This Visit's Progress   . [facility] - Monitor and Manage My Blood Sugar-Diabetes Type 1       Timeframe:  Short-Term Goal Priority:  High Start Date:       04/17/2020                      Expected End Date:       05/18/2020  Follow Up Date 04/24/2020   - check blood sugar at prescribed times - check blood sugar before and after exercise - check blood sugar if I feel it is too high or too low - enter blood sugar readings and medication or insulin into daily log - take the blood sugar log to all doctor visits    Why is this important?    Checking your blood sugar at home helps to keep it from getting very high or very low.   Writing the results in a diary or log helps the doctor know how to care for you.   Your blood sugar log should have the time, the date and the results.   Also, write down the amount of insulin or other medicine you take.   Other information like what you ate, exercise done and how you were feeling will also be helpful..     Notes:     . [facility] - Perform Foot Care-Diabetes Type 1       Timeframe:  Short-Term Goal Priority:  High Start Date:          04/17/2020                   Expected End Date:    05/18/2020                   Follow Up Date 04/24/2020   - check feet daily for cuts, sores or redness - keep feet up while sitting - trim toenails straight across - wash and dry feet carefully every day - wear comfortable, cotton socks    Why is this important?    Good foot care is very important when you have diabetes.   There are  many things you can do to keep your feet healthy and catch a problem early.    Notes:     . [facility] - Set My Target A1C       Timeframe:  Long-Range Goal Priority:  Medium Start Date:       04/17/2020                      Expected End Date:    06/15/2020                   Follow Up Date 04/24/2020   - set target A1C 7   Why is this important?    Your target A1C is decided together by you and your doctor.   It is based on several things like your age and other health issues.    Notes:        Plan:  Follow-up:  Patient agrees to Care Plan and Follow-up.  Will notify PCP of involvement.  Will send diabetic and wound education.  Will place referral for Care Guide and assistance applying for Disability.  Will send Advanced Directive information  Will follow up within the next week.  Kemper Durie, [place], MSN [facility] Care Management  [facility] Manager [phone]

## 2020-04-17 NOTE — Telephone Encounter (Signed)
Monica from [facility] called she wants to know if wound vac needs to be apart of pt and if so she is requesting a order to be sent for wound vacs PER patient she doesn't have a preference for home care [phone]

## 2020-04-18 ENCOUNTER — Telehealth: Payer: Self-pay

## 2020-04-18 NOTE — Patient Instructions (Signed)
Goals Addressed            This Visit's Progress   . [facility] - Monitor and Manage My Blood Sugar-Diabetes Type 1       Timeframe:  Short-Term Goal Priority:  High Start Date:       04/17/2020                      Expected End Date:       05/18/2020                Follow Up Date 04/24/2020   - check blood sugar at prescribed times - check blood sugar before and after exercise - check blood sugar if I feel it is too high or too low - enter blood sugar readings and medication or insulin into daily log - take the blood sugar log to all doctor visits    Why is this important?    Checking your blood sugar at home helps to keep it from getting very high or very low.   Writing the results in a diary or log helps the doctor know how to care for you.   Your blood sugar log should have the time, the date and the results.   Also, write down the amount of insulin or other medicine you take.   Other information like what you ate, exercise done and how you were feeling will also be helpful..     Notes:     . [facility] - Perform Foot Care-Diabetes Type 1       Timeframe:  Short-Term Goal Priority:  High Start Date:          04/17/2020                   Expected End Date:    05/18/2020                   Follow Up Date 04/24/2020   - check feet daily for cuts, sores or redness - keep feet up while sitting - trim toenails straight across - wash and dry feet carefully every day - wear comfortable, cotton socks    Why is this important?    Good foot care is very important when you have diabetes.   There are many things you can do to keep your feet healthy and catch a problem early.    Notes:     . [facility] - Set My Target A1C       Timeframe:  Long-Range Goal Priority:  Medium Start Date:       04/17/2020                      Expected End Date:    06/15/2020                   Follow Up Date 04/24/2020   - set target A1C 7   Why is this important?    Your target A1C is decided together by you  and your doctor.   It is based on several things like your age and other health issues.    Notes:        Critical care medicine: Principles of diagnosis and management in the adult (4th ed., pp. 4174-0814). Saunders."> Miller's anesthesia (8th ed., pp. 232-250). Saunders.">  Advance Directive  Advance directives are legal documents that allow you to make decisions about your health care and medical treatment in case you  become unable to communicate for yourself. Advance directives let your wishes be known to family, friends, and health care providers. Discussing and writing advance directives should happen over time rather than all at once. Advance directives can be changed and updated at any time. There are different types of advance directives, such as:  Medical power of attorney.  Living will.  Do not resuscitate (DNR) order or do not attempt resuscitation (DNAR) order. Health care proxy and medical power of attorney A health care proxy is also called a health care agent. This person is appointed to make medical decisions for you when you are unable to make decisions for yourself. Generally, people ask a trusted friend or family member to act as their proxy and represent their preferences. Make sure you have an agreement with your trusted person to act as your proxy. A proxy may have to make a medical decision on your behalf if your wishes are not known. A medical power of attorney, also called a durable power of attorney for health care, is a legal document that names your health care proxy. Depending on the laws in your state, the document may need to be:  Signed.  Notarized.  Dated.  Copied.  Witnessed.  Incorporated into your medical record. You may also want to appoint a trusted person to manage your money in the event you are unable to do so. This is called a durable power of attorney for finances. It is a separate legal document from the durable power of attorney for  health care. You may choose your health care proxy or someone different to act as your agent in money matters. If you do not appoint a proxy, or there is a concern that the proxy is not acting in your best interest, a court may appoint a guardian to act on your behalf. Living will A living will is a set of instructions that state your wishes about medical care when you cannot express them yourself. Health care providers should keep a copy of your living will in your medical record. You may want to give a copy to family members or friends. To alert caregivers in case of an emergency, you can place a card in your wallet to let them know that you have a living will and where they can find it. A living will is used if you become:  Terminally ill.  Disabled.  Unable to communicate or make decisions. The following decisions should be included in your living will:  To use or not to use life support equipment, such as dialysis machines and breathing machines (ventilators).  Whether you want a DNR or DNAR order. This tells health care providers not to use cardiopulmonary resuscitation (CPR) if breathing or heartbeat stops.  To use or not to use tube feeding.  To be given or not to be given food and fluids.  Whether you want comfort (palliative) care when the goal becomes comfort rather than a cure.  Whether you want to donate your organs and tissues. A living will does not give instructions for distributing your money and property if you should pass away. DNR or DNAR A DNR or DNAR order is a request not to have CPR in the event that your heart stops beating or you stop breathing. If a DNR or DNAR order has not been made and shared, a health care provider will try to help any patient whose heart has stopped or who has stopped breathing. If you plan to have surgery,  talk with your health care provider about how your DNR or DNAR order will be followed if problems occur. What if I do not have an advance  directive? Some states assign family decision makers to act on your behalf if you do not have an advance directive. Each state has its own laws about advance directives. You may want to check with your health care provider, attorney, or state representative about the laws in your state. Summary  Advance directives are legal documents that allow you to make decisions about your health care and medical treatment in case you become unable to communicate for yourself.  The process of discussing and writing advance directives should happen over time. You can change and update advance directives at any time.  Advance directives may include a medical power of attorney, a living will, and a DNR or DNAR order. This information is not intended to replace advice given to you by your health care provider. Make sure you discuss any questions you have with your health care provider. Document Revised: 12/20/2019 Document Reviewed: 12/20/2019 Elsevier Patient Education  2021 [employer].

## 2020-04-18 NOTE — Telephone Encounter (Cosign Needed Addendum)
Transition Care Management Follow-up Telephone Call  Date of discharge and from where: 04/16/20 [facility]  How have you been since you were released from the hospital? [facility] but having pain today  Any questions or concerns? No  Items Reviewed:  Did the pt receive and understand the discharge instructions provided? Yes   Medications obtained and verified? Yes   Other? No   Any new allergies since your discharge? No   Dietary orders reviewed? Yes  Do you have support at home? Yes   Home Care and Equipment/Supplies: Were home health services ordered? yes If so, what is the name of the agency?   Has the agency set up a time to come to the patient's home?  Were any new equipment or medical supplies ordered?  Yes: wound vac What is the name of the medical supply agency?  Were you able to get the supplies/equipment?  Do you have any questions related to the use of the equipment or supplies?   Functional Questionnaire: (I = Independent and D = Dependent) ADLs: D  Bathing/Dressing- D  Meal Prep- D  Eating- I  Maintaining continence- I  Transferring/Ambulation- D  Managing Meds- I pt stated she has home support  Follow up appointments reviewed:   PCP Hospital f/u appt confirmed? Yes  Scheduled to see Dr Jimmey Ralph on 04/20/20 @ 1:20.  Specialist Hospital f/u appt confirmed? Yes  Scheduled to see Dr Berna Spare duda on 04/23/20 @ 9:30.  Are transportation arrangements needed? No   If their condition worsens, is the pt aware to call PCP or go to the Emergency Dept.? Yes  Was the patient provided with contact information for the PCP's office or ED? Yes  Was to pt encouraged to call back with questions or concerns? Yes

## 2020-04-20 ENCOUNTER — Other Ambulatory Visit: Payer: Self-pay

## 2020-04-20 ENCOUNTER — Ambulatory Visit: Payer: 59 | Admitting: Physician Assistant

## 2020-04-20 ENCOUNTER — Telehealth: Payer: Self-pay

## 2020-04-20 ENCOUNTER — Emergency Department (HOSPITAL_COMMUNITY)
Admission: EM | Admit: 2020-04-20 | Discharge: 2020-04-20 | Disposition: A | Discharge status: Home / Self Care | Payer: 59 | Attending: Emergency Medicine | Admitting: Emergency Medicine

## 2020-04-20 ENCOUNTER — Ambulatory Visit (INDEPENDENT_AMBULATORY_CARE_PROVIDER_SITE_OTHER): Payer: 59 | Admitting: Family Medicine

## 2020-04-20 ENCOUNTER — Encounter (HOSPITAL_COMMUNITY): Payer: Self-pay | Admitting: Emergency Medicine

## 2020-04-20 ENCOUNTER — Encounter: Payer: Self-pay | Admitting: Family Medicine

## 2020-04-20 VITALS — BP 196/97 | HR 109 | Temp 98.8°F | Ht 67.0 in | Wt 157.0 lb

## 2020-04-20 DIAGNOSIS — E1065 Type 1 diabetes mellitus with hyperglycemia: Secondary | ICD-10-CM | POA: Insufficient documentation

## 2020-04-20 DIAGNOSIS — I129 Hypertensive chronic kidney disease with stage 1 through stage 4 chronic kidney disease, or unspecified chronic kidney disease: Secondary | ICD-10-CM | POA: Insufficient documentation

## 2020-04-20 DIAGNOSIS — E1043 Type 1 diabetes mellitus with diabetic autonomic (poly)neuropathy: Secondary | ICD-10-CM | POA: Insufficient documentation

## 2020-04-20 DIAGNOSIS — E1069 Type 1 diabetes mellitus with other specified complication: Secondary | ICD-10-CM

## 2020-04-20 DIAGNOSIS — E1159 Type 2 diabetes mellitus with other circulatory complications: Secondary | ICD-10-CM | POA: Diagnosis not present

## 2020-04-20 DIAGNOSIS — E43 Unspecified severe protein-calorie malnutrition: Secondary | ICD-10-CM | POA: Diagnosis not present

## 2020-04-20 DIAGNOSIS — I152 Hypertension secondary to endocrine disorders: Secondary | ICD-10-CM | POA: Diagnosis not present

## 2020-04-20 DIAGNOSIS — Z79899 Other long term (current) drug therapy: Secondary | ICD-10-CM | POA: Diagnosis not present

## 2020-04-20 DIAGNOSIS — E1022 Type 1 diabetes mellitus with diabetic chronic kidney disease: Secondary | ICD-10-CM | POA: Insufficient documentation

## 2020-04-20 DIAGNOSIS — L97509 Non-pressure chronic ulcer of other part of unspecified foot with unspecified severity: Secondary | ICD-10-CM | POA: Insufficient documentation

## 2020-04-20 DIAGNOSIS — E10621 Type 1 diabetes mellitus with foot ulcer: Secondary | ICD-10-CM | POA: Insufficient documentation

## 2020-04-20 DIAGNOSIS — N183 Chronic kidney disease, stage 3 unspecified: Secondary | ICD-10-CM | POA: Diagnosis not present

## 2020-04-20 DIAGNOSIS — Z794 Long term (current) use of insulin: Secondary | ICD-10-CM | POA: Insufficient documentation

## 2020-04-20 DIAGNOSIS — E162 Hypoglycemia, unspecified: Secondary | ICD-10-CM

## 2020-04-20 DIAGNOSIS — Z8616 Personal history of COVID-19: Secondary | ICD-10-CM | POA: Diagnosis not present

## 2020-04-20 LAB — CBG MONITORING, ED
Glucose-Capillary: 27 mg/dL — CL (ref 70–99)
Glucose-Capillary: 40 mg/dL — CL (ref 70–99)
Glucose-Capillary: 118 mg/dL — ABNORMAL HIGH (ref 70–99)

## 2020-04-20 LAB — CBC WITH DIFFERENTIAL/PLATELET
Abs Immature Granulocytes: 0.06 10*3/uL (ref 0.00–0.07)
Basophils Absolute: 0 10*3/uL (ref 0.0–0.1)
Basophils Relative: 0 %
Eosinophils Absolute: 0.1 10*3/uL (ref 0.0–0.5)
Eosinophils Relative: 1 %
HCT: 28.9 % — ABNORMAL LOW (ref 36.0–46.0)
Hemoglobin: 8.7 g/dL — ABNORMAL LOW (ref 12.0–15.0)
Immature Granulocytes: 1 %
Lymphocytes Relative: 8 %
Lymphs Abs: 0.9 10*3/uL (ref 0.7–4.0)
MCH: 27.9 pg (ref 26.0–34.0)
MCHC: 30.1 g/dL (ref 30.0–36.0)
MCV: 92.6 fL (ref 80.0–100.0)
Monocytes Absolute: 0.6 10*3/uL (ref 0.1–1.0)
Monocytes Relative: 6 %
Neutro Abs: 9.7 10*3/uL — ABNORMAL HIGH (ref 1.7–7.7)
Neutrophils Relative %: 84 %
Platelets: 247 10*3/uL (ref 150–400)
RBC: 3.12 MIL/uL — ABNORMAL LOW (ref 3.87–5.11)
RDW: 13.8 % (ref 11.5–15.5)
WBC: 11.4 10*3/uL — ABNORMAL HIGH (ref 4.0–10.5)
nRBC: 0 % (ref 0.0–0.2)

## 2020-04-20 LAB — COMPREHENSIVE METABOLIC PANEL
ALT: 14 U/L (ref 0–44)
AST: 21 U/L (ref 15–41)
Albumin: 2 g/dL — ABNORMAL LOW (ref 3.5–5.0)
Alkaline Phosphatase: 74 U/L (ref 38–126)
Anion gap: 10 (ref 5–15)
BUN: 15 mg/dL (ref 6–20)
CO2: 19 mmol/L — ABNORMAL LOW (ref 22–32)
Calcium: 8.8 mg/dL — ABNORMAL LOW (ref 8.9–10.3)
Chloride: 108 mmol/L (ref 98–111)
Creatinine, Ser: 1.19 mg/dL — ABNORMAL HIGH (ref 0.44–1.00)
GFR, Estimated: >60 mL/min (ref >60)
Glucose, Bld: 130 mg/dL — ABNORMAL HIGH (ref 70–99)
Potassium: 4 mmol/L (ref 3.5–5.1)
Sodium: 137 mmol/L (ref 135–145)
Total Bilirubin: 0.3 mg/dL (ref 0.3–1.2)
Total Protein: 7.6 g/dL (ref 6.5–8.1)

## 2020-04-20 LAB — LIPASE, BLOOD: Lipase: 17 U/L (ref 11–51)

## 2020-04-20 LAB — I-STAT BETA HCG BLOOD, ED (MC, WL, AP ONLY): I-stat hCG, quantitative: <5 m[IU]/mL (ref <5)

## 2020-04-20 MED ORDER — DEXTROSE 50 % IV SOLN
INTRAVENOUS | Status: AC
  Filled 2020-04-20: qty 50

## 2020-04-20 MED ORDER — GLUCOSE 40 % PO GEL
ORAL | Status: AC
  Filled 2020-04-20: qty 1

## 2020-04-20 MED ORDER — ONDANSETRON 4 MG PO TBDP: 4.0000 mg | ORAL_TABLET | Freq: Once | ORAL | Status: DC

## 2020-04-20 MED ORDER — GABAPENTIN 300 MG PO CAPS: 300.0000 mg | ORAL_CAPSULE | Freq: Every day | ORAL | 3 refills | Status: DC

## 2020-04-20 MED ORDER — GLUCOSE 40 % PO GEL: 1.0000 | Freq: Once | ORAL | Status: DC

## 2020-04-20 NOTE — ED Notes (Signed)
Multiple failed attempts to establish IV access by multiple RN's and MD. [place] PA notified. Stat IV team consult placed.

## 2020-04-20 NOTE — Progress Notes (Signed)
Chief Complaint:  [name] is a 30 y.o. female who presents today for a TCM visit.  Assessment/Plan:  New/Acute Problems: Diabetic Foot Ulcer Status post first ray indication.  She will be having wound check later today and following up with orthopedics next week.  She is having some phantom limb pain.  We will start gabapentin.  She has done all this in the past.  She will check with orthopedics about getting a refill on her oxycodone.  Chronic Problems Addressed Today: Protein-calorie malnutrition, severe (HCC) albumin 1.8 while hospitalized.  Likely contributing to poor wound healing and issues of infection.  Will place referral to nutritionist.  Discussed high-protein food sources.  Diabetes mellitus type 1 (HCC) A1c 8.6 while hospitalized.  Continue current regimen per endocrinology.  Hypertension associated with diabetes ([place]) Above goal in setting of acute pain.  Typically well controlled.  Continue home regimen Coreg 6.25 mg twice daily and hydralazine 50 mg 3 times daily.  Continue home monitoring with goal 140/90 or lower.    Subjective:  HPI:  Summary of Hospital admission: Reason for admission: Sepsis secondary to osteomyelitis Date of admission: 04/09/2020 Date of discharge: 04/16/2020 Date of Interactive contact: 04/18/2020 Summary of Hospital course: Patient presented to the ED with foot pain, swelling, and discharge.  X-ray concerning for osteomyelitis.  She was admitted.  Orthopedics was consulted.  Underwent first ray amputation on the right foot.  She was given IV antibiotics during admission but did not need any antibiotics at the time of discharge.  Interim History: She has done well since being home.  She still has some pain in her right foot.  Feels more like a phantom pain.  She has prescribed oxycodone and has been taking as directed.  She will be running out of medication later today.  Overall her sugars have been relatively well controlled.  She is  having some issues with her blood sugars when she does not eat or has issues with cyclic vomiting.  She is trying to get more protein in her diet but it is difficult due to her cyclic vomiting and allergies.  ROS: , otherwise a complete review of systems was negative.   PMH:  The following were reviewed and entered/updated in epic: Past Medical History:  Diagnosis Date  . Allergy   . Anemia   . Anxiety   . Blood transfusion without reported diagnosis    Dec 2018  . Cataract    right eye  . COVID-19 03/2020  . Depression   . Diabetes type 1, uncontrolled ([place]) 11/14/2011   Since age 39  . Fibromyalgia   . Gastroparesis   . GERD (gastroesophageal reflux disease)   . Hypertension   . Infection    UTI April 2016   Patient Active Problem List   Diagnosis Date Noted  . Iron deficiency 04/11/2020  . Diabetic foot ulcer ([place]) 04/09/2020  . 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 Cannabinoid hyperemesis syndrome 04/30/2019  . Contraception management 08/06/2018  . Cyclical vomiting syndrome 06/07/2018  . Diabetic gastroparesis associated with type 1 diabetes mellitus ([place]) 06/04/2018  . Anxiety 05/31/2018  . Peripheral edema 05/31/2018  . Normocytic anemia 07/10/2017  . GERD (gastroesophageal reflux disease) 06/23/2017  . Intractable nausea and vomiting 04/11/2017  . MDD (major depressive disorder), recurrent episode, mild ([place]) 02/28/2017  . Diabetes mellitus type 1 ([place]) 04/28/2013  . Protein-calorie malnutrition, severe ([place]) 04/28/2013  . Hypertension associated with diabetes (  [place]) 11/14/2011  . Chronic abdominal pain 09/18/2011   Past Surgical History:  Procedure Laterality Date  . AMPUTATION Right 04/13/2020   Procedure: 1ST RAY AMPUTATION RIGHT FOOT;  Surgeon: Newt Minion, MD;  Location: [facility];  Service: Orthopedics;  Laterality: Right;  . ANKLE SURGERY    . CHOLECYSTECTOMY  11/15/2011   Procedure: LAPAROSCOPIC CHOLECYSTECTOMY WITH  INTRAOPERATIVE CHOLANGIOGRAM;  Surgeon: [name], MD;  [facility];  Service: General;  Laterality: N/A;  . COLONOSCOPY    . COLONOSCOPY WITH PROPOFOL N/A 06/27/2017   Procedure: COLONOSCOPY WITH PROPOFOL;  Surgeon: Milus Banister, MD;  [facility];  Service: Endoscopy;  Laterality: N/A;  . ESOPHAGOGASTRODUODENOSCOPY  12/03/2011   Procedure: ESOPHAGOGASTRODUODENOSCOPY (EGD);  Surgeon: Beryle Beams, MD;  Location[facility];  Service: Endoscopy;  Laterality: N/A;  . FLEXIBLE SIGMOIDOSCOPY N/A 03/10/2017   Procedure: FLEXIBLE SIGMOIDOSCOPY;  Surgeon: Carol Ada, MD;  [facility];  Service: Endoscopy;  Laterality: N/A;  . INCISION AND DRAINAGE PERIRECTAL ABSCESS N/A 03/01/2017   Procedure: IRRIGATION AND DEBRIDEMENT PERIRECTAL ABSCESS;  Surgeon: Alphonsa Overall, MD;  [facility];  Service: General;  Laterality: N/A;  . IRRIGATION AND DEBRIDEMENT BUTTOCKS N/A 03/23/2017   Procedure: IRRIGATION AND DEBRIDEMENT BUTTOCKS, SETON PLACEMENT;  Surgeon: [name], MD;  [facility];  Service: General;  Laterality: N/A;  . LAPAROSCOPY  11/23/2011   Procedure: LAPAROSCOPY DIAGNOSTIC;  Surgeon: Edward Jolly, MD;  [facility];  Service: General;  Laterality: N/A;  . SIGMOIDOSCOPY    . UPPER GASTROINTESTINAL ENDOSCOPY    . WISDOM TOOTH EXTRACTION      Family History  Problem Relation Age of Onset  . Diabetes Mother   . Hypertension Father   . Colon cancer Paternal Grandmother        pt thinks PGM was dx in her 33's  . Diabetes Paternal Grandmother   . Diabetes Maternal Grandmother   . Diabetes Maternal Grandfather   . Diabetes Paternal Grandfather   . Diabetes Other   . Breast cancer Paternal Aunt   . Esophageal cancer Neg Hx   . Liver cancer Neg Hx   . Pancreatic cancer Neg Hx   . Stomach cancer Neg Hx   . Rectal cancer Neg Hx     Medications- Reconciled discharge and current medications in Epic.  Current Outpatient Medications   Medication Sig Dispense Refill  . aluminum-magnesium hydroxide-simethicone (MAALOX) 664-403-47 MG/5ML SUSP Take 30 mLs by mouth 3 (three) times daily as needed (indigestion).    . busPIRone (BUSPAR) 5 MG tablet Take 1 tablet (5 mg total) by mouth 3 (three) times daily. 90 tablet 5  . carvedilol (COREG) 12.5 MG tablet Take 1 tablet (12.5 mg total) by mouth 2 (two) times daily with a meal. 60 tablet 2  . Continuous Blood Gluc Sensor (DEXCOM G6 SENSOR) MISC USE AS DIRECTED WITH CONTINUOUS BLOOD GLUCOSE MONITOR. REPLACE EVERY 10 DAYS 3 each 1  . Continuous Blood Gluc Transmit (DEXCOM G6 TRANSMITTER) MISC USE AS DIRECTED TO  MONITOR  GLUCOSE  LEVELS 1 each 0  . cyclobenzaprine (FLEXERIL) 10 MG tablet TAKE 1 TABLET(10 MG) BY MOUTH THREE TIMES DAILY AS NEEDED FOR MUSCLE SPASMS 30 tablet 0  . dicyclomine (BENTYL) 20 MG tablet Take 1 tablet (20 mg total) by mouth every 8 (eight) hours as needed for spasms. 15 tablet 0  . gabapentin (NEURONTIN) 300 MG capsule Take 1 capsule (300 mg total) by mouth at bedtime. 90 capsule 3  .  hydrALAZINE (APRESOLINE) 50 MG tablet TAKE 1 TABLET(50 MG) BY MOUTH EVERY 6 HOURS (Patient taking differently: Take 50 mg by mouth every 6 (six) hours.) 120 tablet 0  . Insulin NPH, Human,, Isophane, (HUMULIN N KWIKPEN) 100 UNIT/ML Kiwkpen Inject 20 Units into the skin every morning. 30 mL 11  . isosorbide mononitrate (IMDUR) 30 MG 24 hr tablet Take 30 mg by mouth daily.    Marland Kitchen loperamide (IMODIUM) 2 MG capsule Take 6 mg by mouth daily as needed for diarrhea or loose stools.     Marland Kitchen LORazepam (ATIVAN) 0.5 MG tablet TAKE 1 TABLET(0.5 MG) BY MOUTH TWICE DAILY AS NEEDED FOR ANXIETY (Patient taking differently: Take 0.5 mg by mouth 2 (two) times daily as needed for anxiety.) 60 tablet 1  . metoCLOPramide (REGLAN) 5 MG tablet Take 1 tablet (5 mg total) by mouth in the morning and at bedtime. Please keep your January appt for further refills. Thank you 60 tablet 0  . mirtazapine (REMERON) 30 MG  tablet TAKE 1 TABLET BY MOUTH AT  BEDTIME (Patient taking differently: Take 30 mg by mouth at bedtime.) 30 tablet 11  . ondansetron (ZOFRAN ODT) 4 MG disintegrating tablet Take 1 tablet (4 mg total) by mouth every 8 (eight) hours as needed for nausea or vomiting. 10 tablet 0  . oxyCODONE (OXY IR/ROXICODONE) 5 MG immediate release tablet Take 1 tablet (5 mg total) by mouth every 8 (eight) hours as needed for up to 5 days for moderate pain. 15 tablet 0  . pantoprazole (PROTONIX) 40 MG tablet Take 1 tablet (40 mg total) by mouth 2 (two) times daily. 180 tablet 2  . promethazine (PHENERGAN) 25 MG suppository Place 1 suppository (25 mg total) rectally every 6 (six) hours as needed for nausea or vomiting. 12 each 0  . promethazine (PHENERGAN) 25 MG tablet Take 1 tablet (25 mg total) by mouth every 6 (six) hours as needed for nausea or vomiting. 30 tablet 0  . scopolamine (TRANSDERM-SCOP) 1 MG/3DAYS Place 1 patch onto the skin every 3 (three) days.    . sucralfate (CARAFATE) 1 GM/10ML suspension Take 10 mLs (1 g total) by mouth every 6 (six) hours as needed. 420 mL 1   No current facility-administered medications for this visit.    Allergies-reviewed and updated Allergies  Allergen Reactions  . Other Anaphylaxis    Reaction to [place] nuts   . Lactose Intolerance (Gi) Diarrhea    Social History   Socioeconomic History  . Marital status: Married    Spouse name: sergio  . Number of children: 0  . Years of education: college  . Highest education level: Not on file  Occupational History  . Occupation: [employer] call center    Employer: [employer]  Tobacco Use  . Smoking status: Never Smoker  . Smokeless tobacco: Never Used  Vaping Use  . Vaping Use: Never used  Substance and Sexual Activity  . Alcohol use: No    Alcohol/week: 0.0 standard drinks  . Drug use: Yes    Types: Marijuana  . Sexual activity: Not Currently    Partners: Male    Birth control/protection: None   Other Topics Concern  . Not on file  Social History Narrative  . Not on file   Social Determinants of Health   Financial Resource Strain: Not on file  Food Insecurity: Food Insecurity Present  . Worried About Charity fundraiser in the Last Year: Sometimes true  . Ran Out of Food in the  Last Year: Sometimes true  Transportation Needs: No Transportation Needs  . Lack of Transportation (Medical): No  . Lack of Transportation (Non-Medical): No  Physical Activity: Not on file  Stress: Not on file  Social Connections: Not on file        Objective:  Physical Exam: BP (!) 196/97   Pulse (!) 109   Temp 98.8 F (37.1 C) (Temporal)   Ht 5' 7"  (1.702 m)   Wt 157 lb (71.2 kg)   LMP 03/24/2020 (Approximate)   SpO2 100%   BMI 24.59 kg/m   Gen: NAD, resting comfortably Skin: Warm, dry Neuro: Grossly normal, moves all extremities Psych: Normal affect and thought content  Time Spent: 45 minutes of total time was spent on the date of the encounter performing the following actions: chart review prior to seeing the patient including reviewing recent hospitalization, obtaining history, performing a medically necessary exam, counseling on the treatment plan, placing orders, and documenting in our EHR.       Algis Greenhouse. Jerline Pain, MD 04/20/2020 2:23 PM

## 2020-04-20 NOTE — Assessment & Plan Note (Signed)
albumin 1.8 while hospitalized.  Likely contributing to poor wound healing and issues of infection.  Will place referral to nutritionist.  Discussed high-protein food sources.

## 2020-04-20 NOTE — ED Triage Notes (Signed)
Patient here because she said her blood sugar was low, CBG 27 in triage. Patient alert and drinking orange juice.

## 2020-04-20 NOTE — Telephone Encounter (Signed)
See note  Patient has appointment With Orthopedic Surgery 04/23/2020 at 9:30 AM

## 2020-04-20 NOTE — ED Provider Notes (Signed)
[facility] EMERGENCY DEPARTMENT Provider Note   CSN: [id number] Arrival date & time: 04/20/20  1447     History Chief Complaint  Patient presents with  . Hypoglycemia    [name] is a 30 y.o. female with history of type 1 diabetes, CKD, fibromyalgia, hypertension, gastroparesis presenting to the ED for hypoglycemia that began this morning.  She states her blood sugars have been running in the 100s over the last few days.  This morning it was low around 50 treated with 2 glasses of apple juice.  She was at her PCP office earlier today for follow-up after recent hospital discharge on 04/16/2020 where she had amputation of the right great toe due to osteomyelitis.  She states she was also planning to go to her follow-up appointment Dr. [name] today to have a wound VAC removed.  She has been having pain, treating with oxycodone.  She has not had any fevers or significant change in her leg swelling.  She has been having some nausea and vomiting though this is not any worse than her recurrent nausea vomiting. She took her Humalog this morning, 3 units.  The history is provided by the patient.       Past Medical History:  Diagnosis Date  . Allergy   . Anemia   . Anxiety   . Blood transfusion without reported diagnosis    Dec 2018  . Cataract    right eye  . COVID-19 03/2020  . Depression   . Diabetes type 1, uncontrolled ([place]) 11/14/2011   Since age 48  . Fibromyalgia   . Gastroparesis   . GERD (gastroesophageal reflux disease)   . Hypertension   . Infection    UTI April 2016    Patient Active Problem List   Diagnosis Date Noted  . Iron deficiency 04/11/2020  . Diabetic foot ulcer ([place]) 04/09/2020  . 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 Cannabinoid hyperemesis syndrome 04/30/2019  . Contraception management 08/06/2018  . Cyclical vomiting syndrome 06/07/2018  . Diabetic gastroparesis associated with type 1 diabetes mellitus  ([place]) 06/04/2018  . Anxiety 05/31/2018  . Peripheral edema 05/31/2018  . Normocytic anemia 07/10/2017  . GERD (gastroesophageal reflux disease) 06/23/2017  . Intractable nausea and vomiting 04/11/2017  . MDD (major depressive disorder), recurrent episode, mild ([place]) 02/28/2017  . Diabetes mellitus type 1 ([place]) 04/28/2013  . Protein-calorie malnutrition, severe ([place]) 04/28/2013  . Hypertension associated with diabetes ([place]) 11/14/2011  . Chronic abdominal pain 09/18/2011    Past Surgical History:  Procedure Laterality Date  . AMPUTATION Right 04/13/2020   Procedure: 1ST RAY AMPUTATION RIGHT FOOT;  Surgeon: Newt Minion, MD;  Location: [facility];  Service: Orthopedics;  Laterality: Right;  . ANKLE SURGERY    . CHOLECYSTECTOMY  11/15/2011   Procedure: LAPAROSCOPIC CHOLECYSTECTOMY WITH INTRAOPERATIVE CHOLANGIOGRAM;  Surgeon: [name], MD;  [facility];  Service: General;  Laterality: N/A;  . COLONOSCOPY    . COLONOSCOPY WITH PROPOFOL N/A 06/27/2017   Procedure: COLONOSCOPY WITH PROPOFOL;  Surgeon: Milus Banister, MD;  [facility];  Service: Endoscopy;  Laterality: N/A;  . ESOPHAGOGASTRODUODENOSCOPY  12/03/2011   Procedure: ESOPHAGOGASTRODUODENOSCOPY (EGD);  Surgeon: Beryle Beams, MD;  Location[facility];  Service: Endoscopy;  Laterality: N/A;  . FLEXIBLE SIGMOIDOSCOPY N/A 03/10/2017   Procedure: FLEXIBLE SIGMOIDOSCOPY;  Surgeon: Carol Ada, MD;  [facility];  Service: Endoscopy;  Laterality: N/A;  . INCISION AND DRAINAGE PERIRECTAL  ABSCESS N/A 03/01/2017   Procedure: IRRIGATION AND DEBRIDEMENT PERIRECTAL ABSCESS;  Surgeon: Alphonsa Overall, MD;  [facility];  Service: General;  Laterality: N/A;  . IRRIGATION AND DEBRIDEMENT BUTTOCKS N/A 03/23/2017   Procedure: IRRIGATION AND DEBRIDEMENT BUTTOCKS, SETON PLACEMENT;  Surgeon: [name], MD;  [facility];  Service: General;  Laterality: N/A;  . LAPAROSCOPY  11/23/2011   Procedure: LAPAROSCOPY  DIAGNOSTIC;  Surgeon: Edward Jolly, MD;  [facility];  Service: General;  Laterality: N/A;  . SIGMOIDOSCOPY    . UPPER GASTROINTESTINAL ENDOSCOPY    . WISDOM TOOTH EXTRACTION       OB History    Gravida  1   Para  1   Term      Preterm  1   AB      Living  1     SAB      IAB      Ectopic      Multiple  0   Live Births  1           Family History  Problem Relation Age of Onset  . Diabetes Mother   . Hypertension Father   . Colon cancer Paternal Grandmother        pt thinks PGM was dx in her 10's  . Diabetes Paternal Grandmother   . Diabetes Maternal Grandmother   . Diabetes Maternal Grandfather   . Diabetes Paternal Grandfather   . Diabetes Other   . Breast cancer Paternal Aunt   . Esophageal cancer Neg Hx   . Liver cancer Neg Hx   . Pancreatic cancer Neg Hx   . Stomach cancer Neg Hx   . Rectal cancer Neg Hx     Social History   Tobacco Use  . Smoking status: Never Smoker  . Smokeless tobacco: Never Used  Vaping Use  . Vaping Use: Never used  Substance Use Topics  . Alcohol use: No    Alcohol/week: 0.0 standard drinks  . Drug use: Yes    Types: Marijuana    Home Medications Prior to Admission medications   Medication Sig Start Date End Date Taking? Authorizing Provider  aluminum-magnesium hydroxide-simethicone (MAALOX) 500-938-18 MG/5ML SUSP Take 30 mLs by mouth 3 (three) times daily as needed (indigestion).    [provider]  busPIRone (BUSPAR) 5 MG tablet Take 1 tablet (5 mg total) by mouth 3 (three) times daily. 02/03/20   [name], MD  carvedilol (COREG) 12.5 MG tablet Take 1 tablet (12.5 mg total) by mouth 2 (two) times daily with a meal. 04/16/20 07/15/20  Dahal, Marlowe Aschoff, MD  Continuous Blood Gluc Sensor (DEXCOM G6 SENSOR) MISC USE AS DIRECTED WITH CONTINUOUS BLOOD GLUCOSE MONITOR. REPLACE EVERY 10 DAYS 02/27/20   Renato Shin, MD  Continuous Blood Gluc Transmit (DEXCOM G6 TRANSMITTER) MISC USE AS DIRECTED TO   MONITOR  GLUCOSE  LEVELS 01/19/20   Renato Shin, MD  cyclobenzaprine (FLEXERIL) 10 MG tablet TAKE 1 TABLET(10 MG) BY MOUTH THREE TIMES DAILY AS NEEDED FOR MUSCLE SPASMS 04/02/20   [name], MD  dicyclomine (BENTYL) 20 MG tablet Take 1 tablet (20 mg total) by mouth every 8 (eight) hours as needed for spasms. 11/25/19   Petrucelli, Samantha R, PA-C  gabapentin (NEURONTIN) 300 MG capsule Take 1 capsule (300 mg total) by mouth at bedtime. 04/20/20   [name], MD  hydrALAZINE (APRESOLINE) 50 MG tablet TAKE 1 TABLET(50 MG) BY MOUTH EVERY 6 HOURS Patient taking differently: Take 50 mg  by mouth every 6 (six) hours. 01/30/20   [name], MD  Insulin NPH, Human,, Isophane, (HUMULIN N KWIKPEN) 100 UNIT/ML Kiwkpen Inject 20 Units into the skin every morning. 03/02/20   Renato Shin, MD  isosorbide mononitrate (IMDUR) 30 MG 24 hr tablet Take 30 mg by mouth daily. 01/03/20   [provider]  loperamide (IMODIUM) 2 MG capsule Take 6 mg by mouth daily as needed for diarrhea or loose stools.     [provider]  LORazepam (ATIVAN) 0.5 MG tablet TAKE 1 TABLET(0.5 MG) BY MOUTH TWICE DAILY AS NEEDED FOR ANXIETY Patient taking differently: Take 0.5 mg by mouth 2 (two) times daily as needed for anxiety. 03/07/20   [name], MD  metoCLOPramide (REGLAN) 5 MG tablet Take 1 tablet (5 mg total) by mouth in the morning and at bedtime. Please keep your January appt for further refills. Thank you 03/28/20   Armbruster, Carlota Raspberry, MD  mirtazapine (REMERON) 30 MG tablet TAKE 1 TABLET BY MOUTH AT  BEDTIME Patient taking differently: Take 30 mg by mouth at bedtime. 01/17/20   [name], MD  ondansetron (ZOFRAN ODT) 4 MG disintegrating tablet Take 1 tablet (4 mg total) by mouth every 8 (eight) hours as needed for nausea or vomiting. 04/06/20   Lucretia Kern, DO  oxyCODONE (OXY IR/ROXICODONE) 5 MG immediate release tablet Take 1 tablet (5 mg total) by mouth every 8 (eight) hours as needed for  up to 5 days for moderate pain. 04/16/20 04/21/20  Terrilee Croak, MD  pantoprazole (PROTONIX) 40 MG tablet Take 1 tablet (40 mg total) by mouth 2 (two) times daily. 12/06/19   Armbruster, Carlota Raspberry, MD  promethazine (PHENERGAN) 25 MG suppository Place 1 suppository (25 mg total) rectally every 6 (six) hours as needed for nausea or vomiting. 03/20/20   Mesner, Corene Cornea, MD  promethazine (PHENERGAN) 25 MG tablet Take 1 tablet (25 mg total) by mouth every 6 (six) hours as needed for nausea or vomiting. 03/20/20   Mesner, Corene Cornea, MD  scopolamine (TRANSDERM-SCOP) 1 MG/3DAYS Place 1 patch onto the skin every 3 (three) days.    [provider]  sucralfate (CARAFATE) 1 GM/10ML suspension Take 10 mLs (1 g total) by mouth every 6 (six) hours as needed. 11/24/19   Armbruster, Carlota Raspberry, MD  potassium chloride SA (KLOR-CON) 20 MEQ tablet Take 1 tablet (20 mEq total) by mouth daily for 5 doses. 07/04/19 07/30/19  Janeece Fitting, PA-C    Allergies    Other and Lactose intolerance (gi)  Review of Systems   Review of Systems  All other systems reviewed and are negative.   Physical Exam Updated Vital Signs BP (!) 189/109   Pulse 100   Temp 97.8 F (36.6 C) (Oral)   Resp 18   LMP 03/24/2020 (Approximate)   SpO2 100%   Physical Exam Vitals and nursing note reviewed.  Constitutional:      General: She is not in acute distress.    Appearance: She is well-developed and well-nourished.  HENT:     Head: Normocephalic and atraumatic.  Eyes:     Conjunctiva/sclera: Conjunctivae normal.  Cardiovascular:     Rate and Rhythm: Normal rate and regular rhythm.  Pulmonary:     Effort: Pulmonary effort is normal. No respiratory distress.     Breath sounds: Normal breath sounds.  Abdominal:     General: Bowel sounds are normal.     Palpations: Abdomen is soft.     Tenderness: There  is no abdominal tenderness. There is no guarding or rebound.  Musculoskeletal:     Comments: Bilateral lower extremity edema, right  greater than left.  Right foot has postop shoe and Ace wrap with wound VAC to the distal foot.  Skin:    General: Skin is warm.  Neurological:     Mental Status: She is alert and oriented to person, place, and time.  Psychiatric:        Mood and Affect: Mood and affect normal.        Behavior: Behavior normal.     ED Results / Procedures / Treatments   Labs (all labs ordered are listed, but only abnormal results are displayed) Labs Reviewed  CBG MONITORING, ED - Abnormal; Notable for the following components:      Result Value   Glucose-Capillary 27 (*)    All other components within normal limits  CBG MONITORING, ED - Abnormal; Notable for the following components:   Glucose-Capillary 40 (*)    All other components within normal limits  CBG MONITORING, ED - Abnormal; Notable for the following components:   Glucose-Capillary 118 (*)    All other components within normal limits  COMPREHENSIVE METABOLIC PANEL  LIPASE, BLOOD  CBC WITH DIFFERENTIAL/PLATELET  I-STAT BETA HCG BLOOD, ED ([facility], [facility], [facility] ONLY)    EKG None  Radiology No results found.  Procedures Procedures (including critical care time)  Medications Ordered in ED Medications  dextrose 50 % solution (has no administration in time range)  dextrose (GLUTOSE) 40 % oral gel (has no administration in time range)  ondansetron (ZOFRAN-ODT) disintegrating tablet 4 mg (has no administration in time range)  dextrose (GLUTOSE) 40 % oral gel 37.5 g (has no administration in time range)    ED Course  I have reviewed the triage vital signs and the nursing notes.  Pertinent labs & imaging results that were available during my care of the patient were reviewed by me and considered in my medical decision making (see chart for details).    MDM Rules/Calculators/A&P                          Patient is a type I diabetic, here for hypoglycemia.  She treated at home with apple juice with minimal provement.  CBG on arrival is 15,  she is given apple juice with improvement to 40.  It was very difficult to obtain IV access or blood work, after multiple nursing attempts and ultrasound-guided IV attempts by attending.  Recheck CBG is 117.  Patient states she really wants to go home and no longer wants to stay in the ED.  Discussed risks of recurrent hypoglycemia and potential complications including life-threatening complications related to hypoglycemia.  Patient verbalized understanding of risk and leaves AGAINST MEDICAL ADVICE.  Attending Dr. Ralene Bathe made aware.  Final Clinical Impression(s) / ED Diagnoses Final diagnoses:  Hypoglycemia    Rx / DC Orders ED Discharge Orders    None       Laquanda Bick, [place] N, PA-C 04/20/20 1609    Quintella Reichert, MD 04/20/20 1914

## 2020-04-20 NOTE — ED Notes (Signed)
Pt wishes to leave AMA now that CBG has increased. Pt stated " I have an appointment that I really need to get to at 3 p.m.". [place] PA notified and education was given on the risks of leaving AMA. Pt still verbalized wish to leave and verbalized understanding.

## 2020-04-20 NOTE — Assessment & Plan Note (Signed)
A1c 8.6 while hospitalized.  Continue current regimen per endocrinology.

## 2020-04-20 NOTE — Telephone Encounter (Signed)
Patient called in stating that she wasn't able to get in with her orthopedic, wanting to know if she can get the pain medicine.

## 2020-04-20 NOTE — Assessment & Plan Note (Signed)
Above goal in setting of acute pain.  Typically well controlled.  Continue home regimen Coreg 6.25 mg twice daily and hydralazine 50 mg 3 times daily.  Continue home monitoring with goal 140/90 or lower.

## 2020-04-20 NOTE — Patient Instructions (Signed)
It was very nice to see you today!  I will place a referral for you to see nutritionist make sure that you are getting plenty of protein in your diet.  Please try the gabapentin to help with your pain  Send me a message in a few weeks to let me know how you are doing.  Take care, Dr Jerline Pain  Please try these tips to maintain a healthy lifestyle:   Eat at least 3 REAL meals and 1-2 snacks per day.  Aim for no more than 5 hours between eating.  If you eat breakfast, please do so within one hour of getting up.    Each meal should contain half fruits/vegetables, one quarter protein, and one quarter carbs (no bigger than a computer mouse)   Cut down on sweet beverages. This includes juice, soda, and sweet tea.     Drink at least 1 glass of water with each meal and aim for at least 8 glasses per day   Exercise at least 150 minutes every week.

## 2020-04-20 NOTE — Progress Notes (Signed)
Inpatient Diabetes Program Recommendations  AACE/ADA: New Consensus Statement on Inpatient Glycemic Control (2015)  Target Ranges:  Prepandial:   less than 140 mg/dL      Peak postprandial:   less than 180 mg/dL (1-2 hours)      Critically ill patients:  140 - 180 mg/dL   Lab Results  Component Value Date   GLUCAP 40 (LL) 04/20/2020   HGBA1C 8.6 (H) 04/10/2020    Review of Glycemic Control Results for [name], [name] (MRN [id number]) as of 04/20/2020 15:06  Ref. Range 04/20/2020 14:53 04/20/2020 15:04  Glucose-Capillary Latest Ref Range: 70 - 99 mg/dL 27 (LL) 40 (LL)   Outpatient Diabetes medications: NPH 20 units q AM Current orders for Inpatient glycemic control: none  Inpatient Diabetes Program Recommendations:    Noted hypoglycemia on presentation of 27 mg/dL. Of note, patient was admitted 04/12/2020-04/16/2020. During that admission patient had lows on NPH 20 units and was switched to Lantus 6 units QD.   Per DM coordinator note from 04/12/2019:  "At d/c, do not recommend restart of NPH regimen due to hypoglycemia risk."   Consider restarting basal insulin Lantus 6 units daily?   Thanks, Bronson Curb, MSN, RNC-OB Diabetes Coordinator [phone] (8a-5p)

## 2020-04-23 ENCOUNTER — Ambulatory Visit (INDEPENDENT_AMBULATORY_CARE_PROVIDER_SITE_OTHER): Payer: 59 | Admitting: Physician Assistant

## 2020-04-23 ENCOUNTER — Encounter: Payer: Self-pay | Admitting: Orthopedic Surgery

## 2020-04-23 ENCOUNTER — Ambulatory Visit (INDEPENDENT_AMBULATORY_CARE_PROVIDER_SITE_OTHER): Payer: 59 | Admitting: Psychology

## 2020-04-23 ENCOUNTER — Telehealth: Payer: Self-pay

## 2020-04-23 DIAGNOSIS — F411 Generalized anxiety disorder: Secondary | ICD-10-CM

## 2020-04-23 DIAGNOSIS — Z89411 Acquired absence of right great toe: Secondary | ICD-10-CM

## 2020-04-23 DIAGNOSIS — F330 Major depressive disorder, recurrent, mild: Secondary | ICD-10-CM | POA: Diagnosis not present

## 2020-04-23 MED ORDER — OXYCODONE-ACETAMINOPHEN 5-325 MG PO TABS: 1.0000 | ORAL_TABLET | ORAL | 0 refills | Status: DC | PRN

## 2020-04-23 NOTE — Telephone Encounter (Signed)
   Telephone encounter was:  Successful.  04/23/2020 Name: [name] MRN: [id number] DOB: [birth year]  [name] is a 30 y.o. year old female who is a primary care patient of Jimmey Ralph, Katina Degree, MD . The community resource team was consulted for assistance with disability information about filing  Care guide performed the following interventions: Patient provided with information about care guide support team and interviewed to confirm resource needs Investigation of community resources performed Emailed information about [facility] and [facility] Social Security Office per patient request.  Follow Up Plan:  Care guide will follow up with patient by phone over the next 7 days.  Kearston Putman, AAS Paralegal, [facility] Care Guide . Embedded Care Coordination [facility]  Care Management  [address] [place], [place] [address] ??[email or web address]  ?? [phone]   [email or web address]

## 2020-04-23 NOTE — Telephone Encounter (Signed)
Looks like she saw ortho today and they filled painmeds.  Algis Greenhouse. Jerline Pain, MD 04/23/2020 12:19 PM

## 2020-04-23 NOTE — Progress Notes (Signed)
Office Visit Note   Patient: [name]           Date of Birth: 21-Dec-1990           MRN: [id number] Visit Date: 04/23/2020              Requested by: [name], MD [address] [place],  [place] [address] PCP: [name], MD  Chief Complaint  Patient presents with  . 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  She was supposed to be seen on Friday as her back and stopped functioning.  She missed the appointment because she was at the hospital for hypoglycemia.  She is having some pain and requesting pain pill  Assessment & Plan: Visit Diagnoses: No diagnosis found.  Plan: Emphasized the importance of remaining off her foot.  Also will begin daily Dial cleansing.  Follow-up in 1 week  Follow-Up Instructions: No follow-ups on file.   Ortho Exam  Patient is alert, oriented, no adenopathy, well-dressed, normal affect, normal respiratory effort. Patient is noted to be full weightbearing with her walker.  Incision has maceration some superficial dehiscence approximately.  She does have a palpable dorsalis pedis pulse.  No foul odor no cellulitis no necrotic areas  Imaging: No results found. No images are attached to the encounter.  Labs: Lab Results  Component Value Date   HGBA1C 8.6 (H) 04/10/2020   HGBA1C 9.0 (A) 02/29/2020   HGBA1C 8.3 (H) 09/25/2019   ESRSEDRATE >140 (H) 04/09/2020   ESRSEDRATE 125 (H) 03/23/2017   ESRSEDRATE 67 (H) 11/26/2013   CRP 4.2 (H) 04/15/2020   CRP 7.1 (H) 04/14/2020   CRP 10.2 (H) 04/13/2020   LABURIC 6.8 (H) 01/23/2015   REPTSTATUS 04/14/2020 FINAL 04/09/2020   GRAMSTAIN  07/09/2017    FEW WBC PRESENT, PREDOMINANTLY PMN MODERATE GRAM POSITIVE RODS FEW GRAM POSITIVE COCCI RARE GRAM NEGATIVE RODS Performed at [facility], [place] [address], [place], [place] [address]    CULT  04/09/2020    NO GROWTH 5 DAYS Performed at [facility] [address] [place], [place] [address]    LABORGA KLEBSIELLA PNEUMONIAE 03/01/2017     Lab Results  Component Value Date   ALBUMIN 2.0 (L) 04/20/2020   ALBUMIN 1.8 (L) 04/15/2020   ALBUMIN 2.1 (L) 04/14/2020   PREALBUMIN 17.9 (L) 06/09/2018   PREALBUMIN 21.0 02/18/2018   PREALBUMIN 18.1 03/18/2017   LABURIC 6.8 (H) 01/23/2015    Lab Results  Component Value Date   MG 1.7 03/24/2020   MG 1.6 (L) 03/23/2020   MG 1.8 03/22/2020   Lab Results  Component Value Date   VD25OH 14 (L) 07/05/2014    Lab Results  Component Value Date   PREALBUMIN 17.9 (L) 06/09/2018   PREALBUMIN 21.0 02/18/2018   PREALBUMIN 18.1 03/18/2017   CBC EXTENDED Latest Ref Rng & Units 04/20/2020 04/16/2020 04/15/2020  WBC 4.0 - 10.5 K/uL 11.4(H) 8.1 7.3  RBC 3.87 - 5.11 MIL/uL 3.12(L) 2.54(L) 2.50(L)  HGB 12.0 - 15.0 g/dL 8.7(L) 7.0(L) 7.1(L)  HCT 36.0 - 46.0 % 28.9(L) 22.2(L) 21.6(L)  PLT 150 - 400 K/uL 247 369 368  NEUTROABS 1.7 - 7.7 K/uL 9.7(H) 5.2 3.9  LYMPHSABS 0.7 - 4.0 K/uL 0.9 2.0 2.5     There is no height or weight on file to calculate BMI.  Orders:  No orders of the defined types were placed in this encounter.  Meds ordered this encounter  Medications  . oxyCODONE-acetaminophen (PERCOCET/ROXICET) 5-325 MG tablet    Sig: Take 1 tablet by mouth every 4 (four) hours as needed for severe pain.    Dispense:  30 tablet    Refill:  0     Procedures: No procedures performed  Clinical Data: No additional findings.  ROS:  All other systems negative, except as noted in the HPI. Review of Systems  Objective: Vital Signs: LMP 03/24/2020 (Approximate)   Specialty Comments:  No specialty comments available.  PMFS History: Patient Active Problem List   Diagnosis Date Noted  . Iron deficiency 04/11/2020  . Diabetic foot ulcer ([address]) 04/09/2020  . 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 Cannabinoid hyperemesis syndrome 04/30/2019  .  Contraception management 08/06/2018  . Cyclical vomiting syndrome 06/07/2018  . Diabetic gastroparesis associated with type 1 diabetes mellitus ([place]) 06/04/2018  . Anxiety 05/31/2018  . Peripheral edema 05/31/2018  . Normocytic anemia 07/10/2017  . GERD (gastroesophageal reflux disease) 06/23/2017  . Intractable nausea and vomiting 04/11/2017  . MDD (major depressive disorder), recurrent episode, mild ([place]) 02/28/2017  . Diabetes mellitus type 1 ([place]) 04/28/2013  . Protein-calorie malnutrition, severe ([place]) 04/28/2013  . Hypertension associated with diabetes ([place]) 11/14/2011  . Chronic abdominal pain 09/18/2011   Past Medical History:  Diagnosis Date  . Allergy   . Anemia   . Anxiety   . Blood transfusion without reported diagnosis    Dec 2018  . Cataract    right eye  . COVID-19 03/2020  . Depression   . Diabetes type 1, uncontrolled ([place]) 11/14/2011   Since age 31  . Fibromyalgia   . Gastroparesis   . GERD (gastroesophageal reflux disease)   . Hypertension   . Infection    UTI April 2016    Family History  Problem Relation Age of Onset  . Diabetes Mother   . Hypertension Father   . Colon cancer Paternal Grandmother        pt thinks PGM was dx in her 18's  . Diabetes Paternal Grandmother   . Diabetes Maternal Grandmother   . Diabetes Maternal Grandfather   . Diabetes Paternal Grandfather   . Diabetes Other   . Breast cancer Paternal Aunt   . Esophageal cancer Neg Hx   . Liver cancer Neg Hx   . Pancreatic cancer Neg Hx   . Stomach cancer Neg Hx   . Rectal cancer Neg Hx     Past Surgical History:  Procedure Laterality Date  . AMPUTATION Right 04/13/2020   Procedure: 1ST RAY AMPUTATION RIGHT FOOT;  Surgeon: Newt Minion, MD;  Location: [facility];  Service: Orthopedics;  Laterality: Right;  . ANKLE SURGERY    . CHOLECYSTECTOMY  11/15/2011   Procedure: LAPAROSCOPIC CHOLECYSTECTOMY WITH INTRAOPERATIVE CHOLANGIOGRAM;  Surgeon: [name], MD;  Location[facility];   Service: General;  Laterality: N/A;  . COLONOSCOPY    . COLONOSCOPY WITH PROPOFOL N/A 06/27/2017   Procedure: COLONOSCOPY WITH PROPOFOL;  Surgeon: Milus Banister, MD;  [facility];  Service: Endoscopy;  Laterality: N/A;  . ESOPHAGOGASTRODUODENOSCOPY  12/03/2011   Procedure: ESOPHAGOGASTRODUODENOSCOPY (EGD);  Surgeon: Beryle Beams, MD;  [facility];  Service: Endoscopy;  Laterality: N/A;  . FLEXIBLE SIGMOIDOSCOPY N/A 03/10/2017   Procedure: FLEXIBLE SIGMOIDOSCOPY;  Surgeon: Carol Ada, MD;  [facility];  Service: Endoscopy;  Laterality: N/A;  . INCISION AND DRAINAGE PERIRECTAL ABSCESS N/A 03/01/2017   Procedure: IRRIGATION AND DEBRIDEMENT PERIRECTAL ABSCESS;  Surgeon: Alphonsa Overall, MD;  [facility];  Service: General;  Laterality: N/A;  . IRRIGATION AND DEBRIDEMENT BUTTOCKS N/A 03/23/2017   Procedure: IRRIGATION AND DEBRIDEMENT BUTTOCKS, SETON PLACEMENT;  Surgeon: [name], MD;  [facility];  Service: General;  Laterality: N/A;  . LAPAROSCOPY  11/23/2011   Procedure: LAPAROSCOPY DIAGNOSTIC;  Surgeon: Edward Jolly, MD;  [facility];  Service: General;  Laterality: N/A;  . SIGMOIDOSCOPY    . UPPER GASTROINTESTINAL ENDOSCOPY    . WISDOM TOOTH EXTRACTION     Social History   Occupational History  . Occupation: [employer] call center    Employer: [employer]  Tobacco Use  . Smoking status: Never Smoker  . Smokeless tobacco: Never Used  Vaping Use  . Vaping Use: Never used  Substance and Sexual Activity  . Alcohol use: No    Alcohol/week: 0.0 standard drinks  . Drug use: Yes    Types: Marijuana  . Sexual activity: Not Currently    Partners: Male    Birth control/protection: None

## 2020-04-23 NOTE — Telephone Encounter (Signed)
While pt was in the office today she advised that a wheelchair had been ordered fro her after her surgery and she has not heard back about it. I spoke  Thedore Mins with [employer] and he advised that due to supply chain shortages that they have the pt at the top of the list for a wheelchair but do not have any in stock right now and not sure when they will have them in stock. I advised the pt who is using a walker for ambulation currently voiced understanding and will call with any questions.

## 2020-04-24 ENCOUNTER — Other Ambulatory Visit: Payer: Self-pay | Admitting: *Deleted

## 2020-04-24 NOTE — Patient Outreach (Signed)
[facility]) Care Management  04/24/2020  [name] 26-Nov-1990 [id number]   Call placed to member to follow up on recent ED visit and to complete initial assessment, no answer, HIPAA compliant voice message left.  Will follow up within the next 3-4 business days.  Kemper Durie, [place], MSN [facility] Care Management  [facility] Manager [phone]

## 2020-04-30 ENCOUNTER — Other Ambulatory Visit: Payer: Self-pay | Admitting: *Deleted

## 2020-04-30 NOTE — Patient Outreach (Signed)
[facility]) Care Management  7/34/1938  [name] [birth year] [id number]   Member scheduled for outreach today, noted that she has been admitted to [facility] since 1/28.  Will follow up within the next 3-4 business days if discharged.    Valente David, [place], MSN [place] [phone]

## 2020-05-01 ENCOUNTER — Telehealth: Payer: Self-pay

## 2020-05-01 NOTE — Telephone Encounter (Signed)
[id number]. DEXCOM G6 MIS SENSOR is approved through 10/28/2020.

## 2020-05-01 NOTE — Telephone Encounter (Signed)
   Telephone encounter was:  Successful.  03/31/6433 Name: [name] MRN: [id number] DOB: [birth year]  [name] is a 30 y.o. year old female who is a primary care patient of Jerline Pain, Algis Greenhouse, MD . The community resource team was consulted for assistance with disability information  Care guide performed the following interventions: Follow up call placed to the patient to discuss status of referral Patient received emailed information about disability. She did not have any questions and does not need further assistance at this time..  Follow Up Plan:  No further follow up planned at this time. The patient has been provided with needed resources.  [name], AAS Paralegal, [place] . Embedded Care Coordination [facility] Health  Care Management  [address], [place] [address] ??[email or web address]  ?? [phone]   [email or web address]

## 2020-05-02 ENCOUNTER — Ambulatory Visit: Payer: 59 | Admitting: Physician Assistant

## 2020-05-03 ENCOUNTER — Other Ambulatory Visit: Payer: Self-pay | Admitting: Physician Assistant

## 2020-05-03 ENCOUNTER — Ambulatory Visit: Payer: Self-pay | Admitting: *Deleted

## 2020-05-04 ENCOUNTER — Telehealth: Payer: Self-pay | Admitting: Orthopedic Surgery

## 2020-05-04 NOTE — Telephone Encounter (Signed)
-----   Message from Pamella Pert, [place] sent at 05/04/2020  1:28 PM EST ----- Regarding: appt This patient was supposed to follow up in the office yesterday for post op appt. Do you mind calling the pt and seeing if she wants to come in next week and let me know what you find out? thanks

## 2020-05-04 NOTE — Telephone Encounter (Signed)
Called pt and she states she's in the hospital still and she will call once she's been discharged to R/S

## 2020-05-09 ENCOUNTER — Other Ambulatory Visit: Payer: Self-pay | Admitting: *Deleted

## 2020-05-09 ENCOUNTER — Ambulatory Visit: Payer: 59 | Admitting: Dietician

## 2020-05-09 ENCOUNTER — Other Ambulatory Visit: Payer: Self-pay | Admitting: Endocrinology

## 2020-05-09 DIAGNOSIS — E1069 Type 1 diabetes mellitus with other specified complication: Secondary | ICD-10-CM

## 2020-05-09 NOTE — Patient Outreach (Signed)
[facility]) Care Management  07/04/5033  [name] [birth year] [id number]   Member has been hospitalized for greater than 10 days [facility]).  Will close case at this time, will anticipate new referral if needed post discharge.  Valente David, [place], MSN [place] [phone]

## 2020-05-09 NOTE — Telephone Encounter (Signed)
Can you follow up and reach out again to pt for post op appt. Thanks!

## 2020-05-10 ENCOUNTER — Ambulatory Visit: Payer: Self-pay | Admitting: *Deleted

## 2020-05-12 ENCOUNTER — Emergency Department (HOSPITAL_COMMUNITY): Payer: 59

## 2020-05-12 ENCOUNTER — Other Ambulatory Visit: Payer: Self-pay

## 2020-05-12 ENCOUNTER — Encounter (HOSPITAL_COMMUNITY): Payer: Self-pay | Admitting: Emergency Medicine

## 2020-05-12 ENCOUNTER — Inpatient Hospital Stay (HOSPITAL_COMMUNITY)
Admission: EM | Admit: 2020-05-12 | Discharge: 2020-05-24 | DRG: 208 | Disposition: A | Discharge status: Home Health | Payer: 59 | Attending: Student | Admitting: Student

## 2020-05-12 DIAGNOSIS — R0602 Shortness of breath: Secondary | ICD-10-CM | POA: Diagnosis not present

## 2020-05-12 DIAGNOSIS — Z89421 Acquired absence of other right toe(s): Secondary | ICD-10-CM | POA: Diagnosis not present

## 2020-05-12 DIAGNOSIS — E43 Unspecified severe protein-calorie malnutrition: Secondary | ICD-10-CM | POA: Diagnosis present

## 2020-05-12 DIAGNOSIS — M869 Osteomyelitis, unspecified: Secondary | ICD-10-CM | POA: Diagnosis present

## 2020-05-12 DIAGNOSIS — R899 Unspecified abnormal finding in specimens from other organs, systems and tissues: Secondary | ICD-10-CM | POA: Insufficient documentation

## 2020-05-12 DIAGNOSIS — K922 Gastrointestinal hemorrhage, unspecified: Secondary | ICD-10-CM | POA: Diagnosis present

## 2020-05-12 DIAGNOSIS — D649 Anemia, unspecified: Secondary | ICD-10-CM | POA: Diagnosis present

## 2020-05-12 DIAGNOSIS — L97516 Non-pressure chronic ulcer of other part of right foot with bone involvement without evidence of necrosis: Secondary | ICD-10-CM | POA: Diagnosis not present

## 2020-05-12 DIAGNOSIS — J1282 Pneumonia due to coronavirus disease 2019: Secondary | ICD-10-CM | POA: Diagnosis present

## 2020-05-12 DIAGNOSIS — J9690 Respiratory failure, unspecified, unspecified whether with hypoxia or hypercapnia: Secondary | ICD-10-CM | POA: Diagnosis present

## 2020-05-12 DIAGNOSIS — E1065 Type 1 diabetes mellitus with hyperglycemia: Secondary | ICD-10-CM | POA: Diagnosis present

## 2020-05-12 DIAGNOSIS — Z79899 Other long term (current) drug therapy: Secondary | ICD-10-CM

## 2020-05-12 DIAGNOSIS — J80 Acute respiratory distress syndrome: Principal | ICD-10-CM

## 2020-05-12 DIAGNOSIS — Z9049 Acquired absence of other specified parts of digestive tract: Secondary | ICD-10-CM

## 2020-05-12 DIAGNOSIS — Y95 Nosocomial condition: Secondary | ICD-10-CM | POA: Diagnosis not present

## 2020-05-12 DIAGNOSIS — Z8249 Family history of ischemic heart disease and other diseases of the circulatory system: Secondary | ICD-10-CM

## 2020-05-12 DIAGNOSIS — J9621 Acute and chronic respiratory failure with hypoxia: Secondary | ICD-10-CM | POA: Diagnosis present

## 2020-05-12 DIAGNOSIS — N170 Acute kidney failure with tubular necrosis: Secondary | ICD-10-CM | POA: Diagnosis present

## 2020-05-12 DIAGNOSIS — E1051 Type 1 diabetes mellitus with diabetic peripheral angiopathy without gangrene: Secondary | ICD-10-CM | POA: Diagnosis present

## 2020-05-12 DIAGNOSIS — R5381 Other malaise: Secondary | ICD-10-CM | POA: Diagnosis not present

## 2020-05-12 DIAGNOSIS — T884XXA Failed or difficult intubation, initial encounter: Secondary | ICD-10-CM | POA: Diagnosis not present

## 2020-05-12 DIAGNOSIS — Z0184 Encounter for antibody response examination: Secondary | ICD-10-CM

## 2020-05-12 DIAGNOSIS — Z9889 Other specified postprocedural states: Secondary | ICD-10-CM | POA: Diagnosis not present

## 2020-05-12 DIAGNOSIS — M86271 Subacute osteomyelitis, right ankle and foot: Secondary | ICD-10-CM

## 2020-05-12 DIAGNOSIS — E440 Moderate protein-calorie malnutrition: Secondary | ICD-10-CM | POA: Insufficient documentation

## 2020-05-12 DIAGNOSIS — L97524 Non-pressure chronic ulcer of other part of left foot with necrosis of bone: Secondary | ICD-10-CM

## 2020-05-12 DIAGNOSIS — R069 Unspecified abnormalities of breathing: Secondary | ICD-10-CM

## 2020-05-12 DIAGNOSIS — Z9989 Dependence on other enabling machines and devices: Secondary | ICD-10-CM | POA: Diagnosis not present

## 2020-05-12 DIAGNOSIS — Z803 Family history of malignant neoplasm of breast: Secondary | ICD-10-CM

## 2020-05-12 DIAGNOSIS — E109 Type 1 diabetes mellitus without complications: Secondary | ICD-10-CM | POA: Diagnosis present

## 2020-05-12 DIAGNOSIS — I152 Hypertension secondary to endocrine disorders: Secondary | ICD-10-CM | POA: Diagnosis not present

## 2020-05-12 DIAGNOSIS — Z978 Presence of other specified devices: Secondary | ICD-10-CM

## 2020-05-12 DIAGNOSIS — E1159 Type 2 diabetes mellitus with other circulatory complications: Secondary | ICD-10-CM | POA: Diagnosis not present

## 2020-05-12 DIAGNOSIS — Z8616 Personal history of COVID-19: Secondary | ICD-10-CM

## 2020-05-12 DIAGNOSIS — E860 Dehydration: Secondary | ICD-10-CM | POA: Diagnosis present

## 2020-05-12 DIAGNOSIS — I96 Gangrene, not elsewhere classified: Secondary | ICD-10-CM

## 2020-05-12 DIAGNOSIS — R4182 Altered mental status, unspecified: Secondary | ICD-10-CM | POA: Diagnosis present

## 2020-05-12 DIAGNOSIS — E10621 Type 1 diabetes mellitus with foot ulcer: Secondary | ICD-10-CM

## 2020-05-12 DIAGNOSIS — E10649 Type 1 diabetes mellitus with hypoglycemia without coma: Secondary | ICD-10-CM | POA: Diagnosis not present

## 2020-05-12 DIAGNOSIS — I5031 Acute diastolic (congestive) heart failure: Secondary | ICD-10-CM | POA: Diagnosis not present

## 2020-05-12 DIAGNOSIS — T8781 Dehiscence of amputation stump: Secondary | ICD-10-CM | POA: Diagnosis not present

## 2020-05-12 DIAGNOSIS — K52839 Microscopic colitis, unspecified: Secondary | ICD-10-CM | POA: Diagnosis present

## 2020-05-12 DIAGNOSIS — E8770 Fluid overload, unspecified: Secondary | ICD-10-CM | POA: Diagnosis present

## 2020-05-12 DIAGNOSIS — E739 Lactose intolerance, unspecified: Secondary | ICD-10-CM | POA: Diagnosis present

## 2020-05-12 DIAGNOSIS — R509 Fever, unspecified: Secondary | ICD-10-CM | POA: Diagnosis not present

## 2020-05-12 DIAGNOSIS — M797 Fibromyalgia: Secondary | ICD-10-CM | POA: Diagnosis present

## 2020-05-12 DIAGNOSIS — Z89411 Acquired absence of right great toe: Secondary | ICD-10-CM

## 2020-05-12 DIAGNOSIS — T8130XA Disruption of wound, unspecified, initial encounter: Secondary | ICD-10-CM | POA: Diagnosis present

## 2020-05-12 DIAGNOSIS — K3184 Gastroparesis: Secondary | ICD-10-CM | POA: Diagnosis present

## 2020-05-12 DIAGNOSIS — F419 Anxiety disorder, unspecified: Secondary | ICD-10-CM | POA: Diagnosis present

## 2020-05-12 DIAGNOSIS — E1069 Type 1 diabetes mellitus with other specified complication: Secondary | ICD-10-CM | POA: Diagnosis present

## 2020-05-12 DIAGNOSIS — J9601 Acute respiratory failure with hypoxia: Secondary | ICD-10-CM | POA: Diagnosis not present

## 2020-05-12 DIAGNOSIS — N179 Acute kidney failure, unspecified: Secondary | ICD-10-CM

## 2020-05-12 DIAGNOSIS — N1831 Chronic kidney disease, stage 3a: Secondary | ICD-10-CM | POA: Diagnosis present

## 2020-05-12 DIAGNOSIS — R197 Diarrhea, unspecified: Secondary | ICD-10-CM | POA: Diagnosis present

## 2020-05-12 DIAGNOSIS — E1022 Type 1 diabetes mellitus with diabetic chronic kidney disease: Secondary | ICD-10-CM | POA: Diagnosis present

## 2020-05-12 DIAGNOSIS — T8131XD Disruption of external operation (surgical) wound, not elsewhere classified, subsequent encounter: Secondary | ICD-10-CM | POA: Diagnosis not present

## 2020-05-12 DIAGNOSIS — G9341 Metabolic encephalopathy: Secondary | ICD-10-CM | POA: Diagnosis present

## 2020-05-12 DIAGNOSIS — I129 Hypertensive chronic kidney disease with stage 1 through stage 4 chronic kidney disease, or unspecified chronic kidney disease: Secondary | ICD-10-CM | POA: Diagnosis present

## 2020-05-12 DIAGNOSIS — E1043 Type 1 diabetes mellitus with diabetic autonomic (poly)neuropathy: Secondary | ICD-10-CM | POA: Diagnosis present

## 2020-05-12 DIAGNOSIS — Z800 Family history of malignant neoplasm of digestive organs: Secondary | ICD-10-CM | POA: Diagnosis not present

## 2020-05-12 DIAGNOSIS — R410 Disorientation, unspecified: Secondary | ICD-10-CM

## 2020-05-12 DIAGNOSIS — M86171 Other acute osteomyelitis, right ankle and foot: Secondary | ICD-10-CM | POA: Diagnosis not present

## 2020-05-12 DIAGNOSIS — Z833 Family history of diabetes mellitus: Secondary | ICD-10-CM | POA: Diagnosis not present

## 2020-05-12 DIAGNOSIS — Z9911 Dependence on respirator [ventilator] status: Secondary | ICD-10-CM | POA: Diagnosis not present

## 2020-05-12 DIAGNOSIS — Z4659 Encounter for fitting and adjustment of other gastrointestinal appliance and device: Secondary | ICD-10-CM

## 2020-05-12 DIAGNOSIS — J189 Pneumonia, unspecified organism: Secondary | ICD-10-CM | POA: Diagnosis not present

## 2020-05-12 DIAGNOSIS — J690 Pneumonitis due to inhalation of food and vomit: Secondary | ICD-10-CM | POA: Diagnosis present

## 2020-05-12 DIAGNOSIS — Z794 Long term (current) use of insulin: Secondary | ICD-10-CM

## 2020-05-12 DIAGNOSIS — R0902 Hypoxemia: Secondary | ICD-10-CM

## 2020-05-12 DIAGNOSIS — Z91018 Allergy to other foods: Secondary | ICD-10-CM | POA: Diagnosis not present

## 2020-05-12 DIAGNOSIS — Z01818 Encounter for other preprocedural examination: Secondary | ICD-10-CM

## 2020-05-12 DIAGNOSIS — R0603 Acute respiratory distress: Secondary | ICD-10-CM | POA: Diagnosis not present

## 2020-05-12 DIAGNOSIS — J9602 Acute respiratory failure with hypercapnia: Secondary | ICD-10-CM | POA: Diagnosis not present

## 2020-05-12 DIAGNOSIS — R600 Localized edema: Secondary | ICD-10-CM | POA: Diagnosis not present

## 2020-05-12 LAB — URINALYSIS, ROUTINE W REFLEX MICROSCOPIC
Bilirubin Urine: NEGATIVE
Glucose, UA: >=500 mg/dL — AB
Ketones, ur: NEGATIVE mg/dL
Leukocytes,Ua: NEGATIVE
Nitrite: NEGATIVE
Protein, ur: >=300 mg/dL — AB
Specific Gravity, Urine: 1.012 (ref 1.005–1.030)
pH: 5 (ref 5.0–8.0)

## 2020-05-12 LAB — I-STAT ARTERIAL BLOOD GAS, ED
Acid-Base Excess: 1 mmol/L (ref 0.0–2.0)
Bicarbonate: 25.4 mmol/L (ref 20.0–28.0)
Calcium, Ion: 1.19 mmol/L (ref 1.15–1.40)
HCT: 27 % — ABNORMAL LOW (ref 36.0–46.0)
Hemoglobin: 9.2 g/dL — ABNORMAL LOW (ref 12.0–15.0)
O2 Saturation: 99 %
Potassium: 3.8 mmol/L (ref 3.5–5.1)
Sodium: 135 mmol/L (ref 135–145)
TCO2: 27 mmol/L (ref 22–32)
pCO2 arterial: 37.5 mmHg (ref 32.0–48.0)
pH, Arterial: 7.438 (ref 7.350–7.450)
pO2, Arterial: 115 mmHg — ABNORMAL HIGH (ref 83.0–108.0)

## 2020-05-12 LAB — CBC
HCT: 31.1 % — ABNORMAL LOW (ref 36.0–46.0)
Hemoglobin: 9.9 g/dL — ABNORMAL LOW (ref 12.0–15.0)
MCH: 29.1 pg (ref 26.0–34.0)
MCHC: 31.8 g/dL (ref 30.0–36.0)
MCV: 91.5 fL (ref 80.0–100.0)
Platelets: 154 10*3/uL (ref 150–400)
RBC: 3.4 MIL/uL — ABNORMAL LOW (ref 3.87–5.11)
RDW: 14.5 % (ref 11.5–15.5)
WBC: 6.2 10*3/uL (ref 4.0–10.5)
nRBC: 0 % (ref 0.0–0.2)

## 2020-05-12 LAB — COMPREHENSIVE METABOLIC PANEL
ALT: 85 U/L — ABNORMAL HIGH (ref 0–44)
AST: 100 U/L — ABNORMAL HIGH (ref 15–41)
Albumin: 1.6 g/dL — ABNORMAL LOW (ref 3.5–5.0)
Alkaline Phosphatase: 120 U/L (ref 38–126)
Anion gap: 10 (ref 5–15)
BUN: 27 mg/dL — ABNORMAL HIGH (ref 6–20)
CO2: 21 mmol/L — ABNORMAL LOW (ref 22–32)
Calcium: 7.9 mg/dL — ABNORMAL LOW (ref 8.9–10.3)
Chloride: 104 mmol/L (ref 98–111)
Creatinine, Ser: 1.78 mg/dL — ABNORMAL HIGH (ref 0.44–1.00)
GFR, Estimated: 39 mL/min — ABNORMAL LOW (ref >60)
Glucose, Bld: 270 mg/dL — ABNORMAL HIGH (ref 70–99)
Potassium: 3.9 mmol/L (ref 3.5–5.1)
Sodium: 135 mmol/L (ref 135–145)
Total Bilirubin: 0.5 mg/dL (ref 0.3–1.2)
Total Protein: 5.8 g/dL — ABNORMAL LOW (ref 6.5–8.1)

## 2020-05-12 LAB — CBC WITH DIFFERENTIAL/PLATELET
Abs Immature Granulocytes: 0.07 10*3/uL (ref 0.00–0.07)
Basophils Absolute: 0 10*3/uL (ref 0.0–0.1)
Basophils Relative: 0 %
Eosinophils Absolute: 0 10*3/uL (ref 0.0–0.5)
Eosinophils Relative: 0 %
HCT: 12.3 % — ABNORMAL LOW (ref 36.0–46.0)
Hemoglobin: 3.7 g/dL — CL (ref 12.0–15.0)
Immature Granulocytes: 1 %
Lymphocytes Relative: 6 %
Lymphs Abs: 0.5 10*3/uL — ABNORMAL LOW (ref 0.7–4.0)
MCH: 28.2 pg (ref 26.0–34.0)
MCHC: 30.1 g/dL (ref 30.0–36.0)
MCV: 93.9 fL (ref 80.0–100.0)
Monocytes Absolute: 0.9 10*3/uL (ref 0.1–1.0)
Monocytes Relative: 10 %
Neutro Abs: 7.2 10*3/uL (ref 1.7–7.7)
Neutrophils Relative %: 83 %
Platelets: 147 10*3/uL — ABNORMAL LOW (ref 150–400)
RBC: 1.31 MIL/uL — ABNORMAL LOW (ref 3.87–5.11)
RDW: 14.5 % (ref 11.5–15.5)
WBC: 8.7 10*3/uL (ref 4.0–10.5)
nRBC: 0 % (ref 0.0–0.2)

## 2020-05-12 LAB — RAPID URINE DRUG SCREEN, HOSP PERFORMED
Amphetamines: NOT DETECTED
Barbiturates: NOT DETECTED
Benzodiazepines: NOT DETECTED
Cocaine: NOT DETECTED
Opiates: POSITIVE — AB
Tetrahydrocannabinol: POSITIVE — AB

## 2020-05-12 LAB — GLUCOSE, CAPILLARY: Glucose-Capillary: 142 mg/dL — ABNORMAL HIGH (ref 70–99)

## 2020-05-12 LAB — CBG MONITORING, ED
Glucose-Capillary: 119 mg/dL — ABNORMAL HIGH (ref 70–99)
Glucose-Capillary: 200 mg/dL — ABNORMAL HIGH (ref 70–99)
Glucose-Capillary: 256 mg/dL — ABNORMAL HIGH (ref 70–99)
Glucose-Capillary: 88 mg/dL (ref 70–99)

## 2020-05-12 LAB — HIV ANTIBODY (ROUTINE TESTING W REFLEX): HIV Screen 4th Generation wRfx: NONREACTIVE

## 2020-05-12 LAB — POC OCCULT BLOOD, ED: Fecal Occult Bld: POSITIVE — AB

## 2020-05-12 LAB — PREPARE RBC (CROSSMATCH)

## 2020-05-12 LAB — CREATININE, SERUM
Creatinine, Ser: 1.9 mg/dL — ABNORMAL HIGH (ref 0.44–1.00)
GFR, Estimated: 36 mL/min — ABNORMAL LOW (ref >60)

## 2020-05-12 LAB — BRAIN NATRIURETIC PEPTIDE: B Natriuretic Peptide: 399.9 pg/mL — ABNORMAL HIGH (ref 0.0–100.0)

## 2020-05-12 LAB — ETHANOL: Alcohol, Ethyl (B): <10 mg/dL (ref <10)

## 2020-05-12 LAB — SARS CORONAVIRUS 2 (TAT 6-24 HRS): SARS Coronavirus 2: NEGATIVE

## 2020-05-12 IMAGING — DX DG CHEST 1V PORT
1 series · 1 of 1 positions shown · non-contrast
Comparison: [DATE].

CLINICAL DATA: PICC placement.

EXAM:
PORTABLE CHEST 1 VIEW

[chest ap]
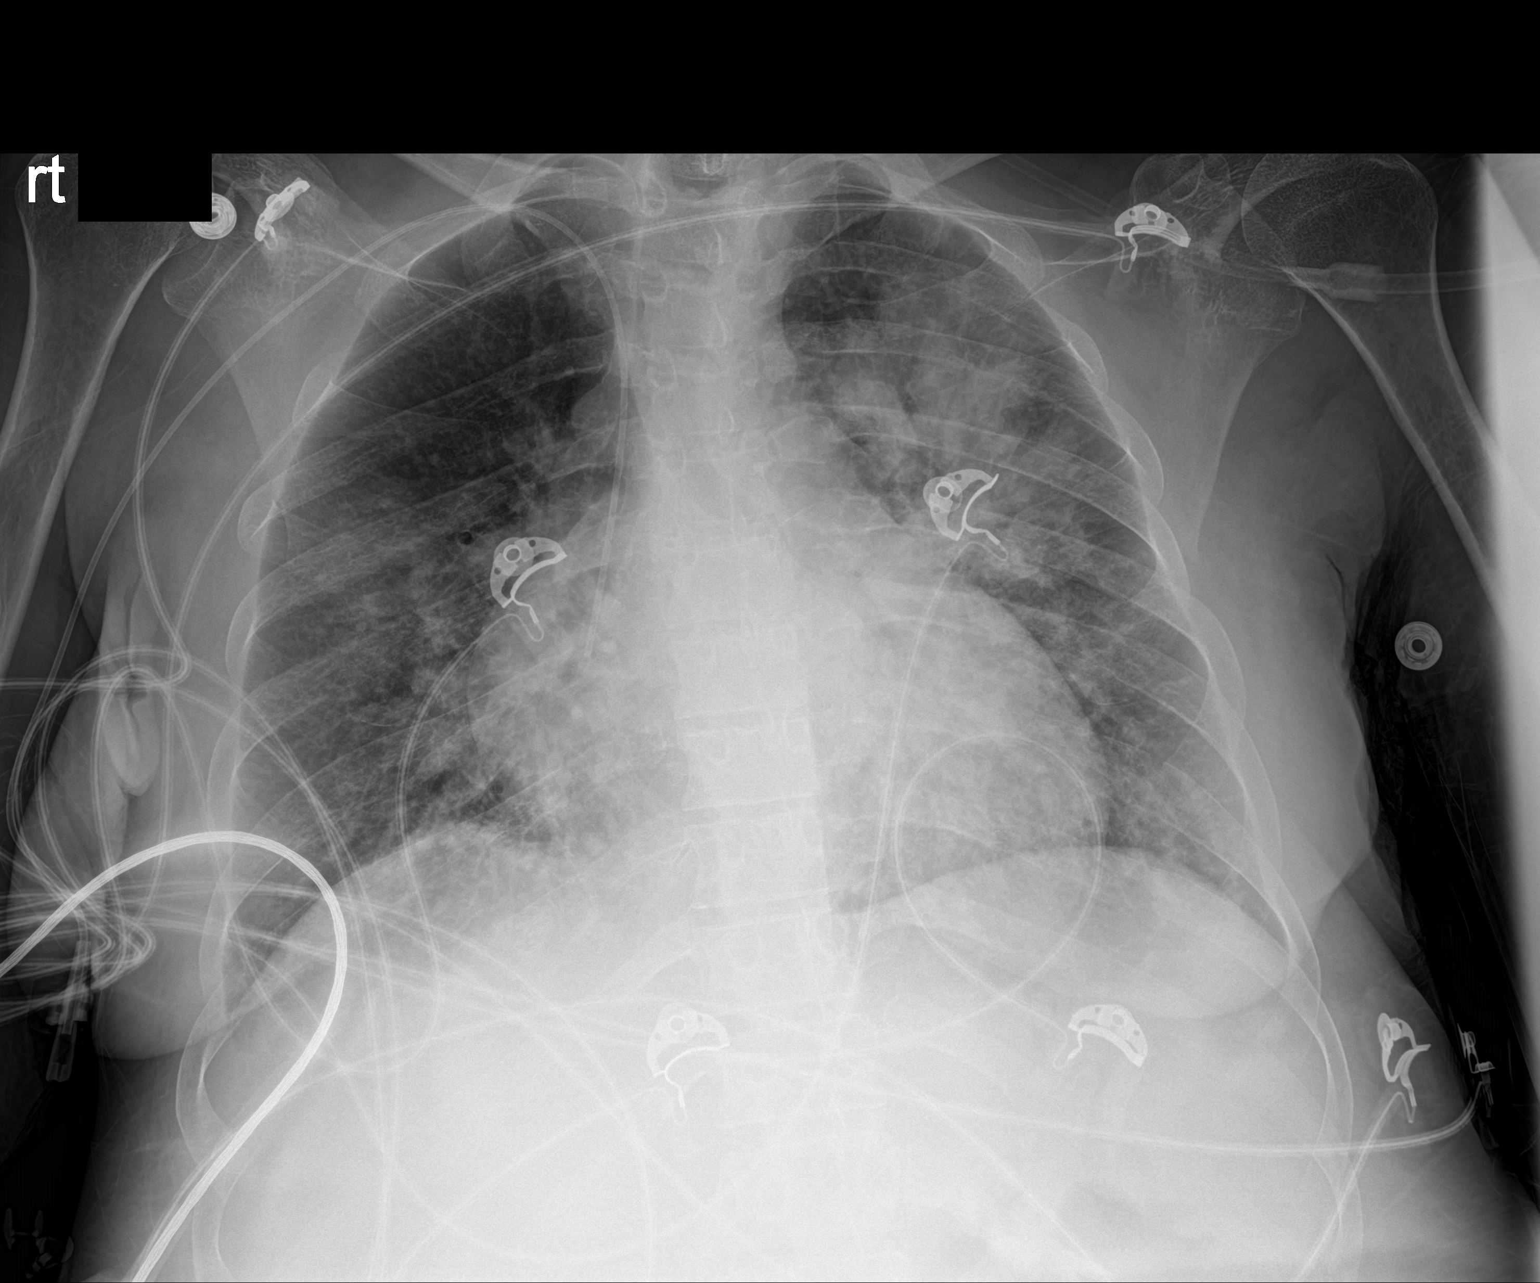

[1 of 1 positions shown; findings below may reference images not displayed]

FINDINGS: Right upper extremity PICC tip at the atrial caval junction.
Increased heart size from prior exam. Patchy heterogeneous bilateral
lung opacities, greatest in the suprahilar region. Possible right
pleural effusion. No pneumothorax.
IMPRESSION: 1. Right upper extremity PICC tip at the atrial caval junction.
2. Patchy heterogeneous bilateral lung opacities which may represent
multifocal pneumonia or pulmonary edema. Increased heart size over
the past 2 weeks, consider echocardiogram to assess for pericardial
effusion.

## 2020-05-12 MED ORDER — ALBUTEROL SULFATE (2.5 MG/3ML) 0.083% IN NEBU
2.5000 mg | INHALATION_SOLUTION | RESPIRATORY_TRACT | Status: DC | PRN
  Administered 2020-05-20: 2.5 mg via RESPIRATORY_TRACT
  Filled 2020-05-12: qty 3

## 2020-05-12 MED ORDER — ACETAMINOPHEN 650 MG RE SUPP: 650.0000 mg | Freq: Four times a day (QID) | RECTAL | Status: DC | PRN

## 2020-05-12 MED ORDER — SODIUM CHLORIDE 0.9% IV SOLUTION: Freq: Once | INTRAVENOUS | Status: AC

## 2020-05-12 MED ORDER — MOMETASONE FURO-FORMOTEROL FUM 100-5 MCG/ACT IN AERO
2.0000 | INHALATION_SPRAY | Freq: Two times a day (BID) | RESPIRATORY_TRACT | Status: DC
  Filled 2020-05-12: qty 8.8

## 2020-05-12 MED ORDER — LACTATED RINGERS IV SOLN: INTRAVENOUS | Status: DC

## 2020-05-12 MED ORDER — ALBUTEROL SULFATE HFA 108 (90 BASE) MCG/ACT IN AERS: 2.0000 | INHALATION_SPRAY | RESPIRATORY_TRACT | Status: DC

## 2020-05-12 MED ORDER — PANTOPRAZOLE SODIUM 40 MG IV SOLR
40.0000 mg | INTRAVENOUS | Status: DC
  Administered 2020-05-12 – 2020-05-15 (×4): 40 mg via INTRAVENOUS
  Filled 2020-05-12 (×4): qty 40

## 2020-05-12 MED ORDER — OXYCODONE-ACETAMINOPHEN 5-325 MG PO TABS
1.0000 | ORAL_TABLET | Freq: Four times a day (QID) | ORAL | Status: DC | PRN
  Administered 2020-05-12: 1 via ORAL
  Filled 2020-05-12: qty 1

## 2020-05-12 MED ORDER — ACETAMINOPHEN 325 MG PO TABS: 650.0000 mg | ORAL_TABLET | Freq: Four times a day (QID) | ORAL | Status: DC | PRN

## 2020-05-12 MED ORDER — OXYCODONE-ACETAMINOPHEN 5-325 MG PO TABS
1.0000 | ORAL_TABLET | ORAL | Status: DC | PRN
  Administered 2020-05-12: 2 via ORAL
  Filled 2020-05-12: qty 2

## 2020-05-12 MED ORDER — GABAPENTIN 300 MG PO CAPS
300.0000 mg | ORAL_CAPSULE | Freq: Every day | ORAL | Status: DC
  Administered 2020-05-13 – 2020-05-14 (×2): 300 mg via ORAL
  Filled 2020-05-12 (×2): qty 1

## 2020-05-12 MED ORDER — ACETAMINOPHEN 10 MG/ML IV SOLN
1000.0000 mg | Freq: Four times a day (QID) | INTRAVENOUS | Status: DC | PRN
  Administered 2020-05-12 – 2020-05-13 (×2): 1000 mg via INTRAVENOUS
  Filled 2020-05-12 (×2): qty 100

## 2020-05-12 MED ORDER — IPRATROPIUM-ALBUTEROL 0.5-2.5 (3) MG/3ML IN SOLN
3.0000 mL | RESPIRATORY_TRACT | Status: DC
  Administered 2020-05-12 – 2020-05-19 (×40): 3 mL via RESPIRATORY_TRACT
  Filled 2020-05-12 (×39): qty 3

## 2020-05-12 MED ORDER — ONDANSETRON HCL 4 MG/2ML IJ SOLN
4.0000 mg | Freq: Four times a day (QID) | INTRAMUSCULAR | Status: DC | PRN
  Administered 2020-05-12: 4 mg via INTRAVENOUS
  Filled 2020-05-12: qty 2

## 2020-05-12 MED ORDER — ONDANSETRON HCL 4 MG PO TABS
4.0000 mg | ORAL_TABLET | Freq: Four times a day (QID) | ORAL | Status: DC | PRN
  Administered 2020-05-12: 4 mg via ORAL
  Filled 2020-05-12: qty 1

## 2020-05-12 MED ORDER — INSULIN ASPART 100 UNIT/ML ~~LOC~~ SOLN
0.0000 [IU] | Freq: Three times a day (TID) | SUBCUTANEOUS | Status: DC
  Administered 2020-05-12: 3 [IU] via SUBCUTANEOUS
  Administered 2020-05-13 (×3): 2 [IU] via SUBCUTANEOUS

## 2020-05-12 MED ORDER — CHLORHEXIDINE GLUCONATE CLOTH 2 % EX PADS
6.0000 | MEDICATED_PAD | Freq: Every day | CUTANEOUS | Status: DC
  Administered 2020-05-13 – 2020-05-24 (×14): 6 via TOPICAL

## 2020-05-12 MED ORDER — HYDRALAZINE HCL 50 MG PO TABS
50.0000 mg | ORAL_TABLET | Freq: Four times a day (QID) | ORAL | Status: DC
  Administered 2020-05-12: 50 mg via ORAL
  Filled 2020-05-12: qty 2

## 2020-05-12 MED ORDER — FUROSEMIDE 10 MG/ML IJ SOLN
60.0000 mg | Freq: Once | INTRAMUSCULAR | Status: AC
  Administered 2020-05-12: 60 mg via INTRAVENOUS
  Filled 2020-05-12: qty 6

## 2020-05-12 MED ORDER — PANTOPRAZOLE SODIUM 40 MG IV SOLR
40.0000 mg | Freq: Once | INTRAVENOUS | Status: AC
  Administered 2020-05-12: 40 mg via INTRAVENOUS
  Filled 2020-05-12: qty 40

## 2020-05-12 MED ORDER — SODIUM CHLORIDE 0.9 % IV SOLN
3.0000 g | Freq: Four times a day (QID) | INTRAVENOUS | Status: DC
  Administered 2020-05-12 – 2020-05-14 (×8): 3 g via INTRAVENOUS
  Filled 2020-05-12 (×10): qty 8

## 2020-05-12 MED ORDER — INSULIN ASPART 100 UNIT/ML ~~LOC~~ SOLN: 0.0000 [IU] | Freq: Every day | SUBCUTANEOUS | Status: DC

## 2020-05-12 MED ORDER — CIPROFLOXACIN HCL 500 MG PO TABS: 500.0000 mg | ORAL_TABLET | Freq: Two times a day (BID) | ORAL | Status: DC

## 2020-05-12 MED ORDER — CARVEDILOL 12.5 MG PO TABS
12.5000 mg | ORAL_TABLET | Freq: Two times a day (BID) | ORAL | Status: DC
  Administered 2020-05-12: 12.5 mg via ORAL
  Filled 2020-05-12: qty 4

## 2020-05-12 MED ORDER — INSULIN NPH (HUMAN) (ISOPHANE) 100 UNIT/ML ~~LOC~~ SUSP
20.0000 [IU] | Freq: Every day | SUBCUTANEOUS | Status: DC
  Filled 2020-05-12 (×2): qty 10

## 2020-05-12 MED ORDER — SODIUM CHLORIDE 0.9 % IV SOLN
500.0000 mg | Freq: Every day | INTRAVENOUS | Status: DC
  Administered 2020-05-12: 500 mg via INTRAVENOUS
  Filled 2020-05-12 (×2): qty 10

## 2020-05-12 MED ORDER — CIPROFLOXACIN IN D5W 400 MG/200ML IV SOLN
400.0000 mg | Freq: Once | INTRAVENOUS | Status: AC
  Administered 2020-05-12: 400 mg via INTRAVENOUS
  Filled 2020-05-12: qty 200

## 2020-05-12 NOTE — Progress Notes (Signed)
Patient arrived to room 2w18 from ED.  Assessment complete, VS obtained, and Admission database began. 

## 2020-05-12 NOTE — ED Notes (Signed)
GI MD at bedside

## 2020-05-12 NOTE — ED Triage Notes (Signed)
Patient from home, was last seen by husband around 830p, found her on the floor when he got home, not responding to him.  She is hyperglycemic of 367, does have oxycodone and etoh on board.  Recent amputation of toe last month.

## 2020-05-12 NOTE — Consult Note (Signed)
ORTHOPAEDIC CONSULTATION  REQUESTING PHYSICIAN: Aline August, MD  Chief Complaint: Painful ischemic changes right foot.  HPI: [name] is a 30 y.o. female who presents with necrotic breakdown first ray amputation.  Patient most recently was in [facility] and she states that Dr. Clarita Leber in podiatry performed a debridement of her foot.  Patient presents at this time with a wound that is open and necrotic.  Past Medical History:  Diagnosis Date  . Allergy   . Anemia   . Anxiety   . Blood transfusion without reported diagnosis    Dec 2018  . Cataract    right eye  . COVID-19 03/2020  . Depression   . Diabetes type 1, uncontrolled ([place]) 11/14/2011   Since age 79  . Fibromyalgia   . Gastroparesis   . GERD (gastroesophageal reflux disease)   . Hypertension   . Infection    UTI April 2016   Past Surgical History:  Procedure Laterality Date  . AMPUTATION Right 04/13/2020   Procedure: 1ST RAY AMPUTATION RIGHT FOOT;  Surgeon: Newt Minion, MD;  Location: [facility];  Service: Orthopedics;  Laterality: Right;  . ANKLE SURGERY    . CHOLECYSTECTOMY  11/15/2011   Procedure: LAPAROSCOPIC CHOLECYSTECTOMY WITH INTRAOPERATIVE CHOLANGIOGRAM;  Surgeon: [name], MD;  Location: [facility];  Service: General;  Laterality: N/A;  . COLONOSCOPY    . COLONOSCOPY WITH PROPOFOL N/A 06/27/2017   Procedure: COLONOSCOPY WITH PROPOFOL;  Surgeon: Milus Banister, MD;  Location: [facility];  Service: Endoscopy;  Laterality: N/A;  . ESOPHAGOGASTRODUODENOSCOPY  12/03/2011   Procedure: ESOPHAGOGASTRODUODENOSCOPY (EGD);  Surgeon: Beryle Beams, MD;  Location: [facility];  Service: Endoscopy;  Laterality: N/A;  . FLEXIBLE SIGMOIDOSCOPY N/A 03/10/2017   Procedure: FLEXIBLE SIGMOIDOSCOPY;  Surgeon: Carol Ada, MD;  Location: [facility];  Service: Endoscopy;  Laterality: N/A;  . INCISION AND DRAINAGE PERIRECTAL ABSCESS N/A 03/01/2017   Procedure: IRRIGATION AND DEBRIDEMENT PERIRECTAL  ABSCESS;  Surgeon: Alphonsa Overall, MD;  Location: [facility];  Service: General;  Laterality: N/A;  . IRRIGATION AND DEBRIDEMENT BUTTOCKS N/A 03/23/2017   Procedure: IRRIGATION AND DEBRIDEMENT BUTTOCKS, SETON PLACEMENT;  Surgeon: [name], MD;  Location: [facility];  Service: General;  Laterality: N/A;  . LAPAROSCOPY  11/23/2011   Procedure: LAPAROSCOPY DIAGNOSTIC;  Surgeon: Edward Jolly, MD;  Location: [facility];  Service: General;  Laterality: N/A;  . SIGMOIDOSCOPY    . UPPER GASTROINTESTINAL ENDOSCOPY    . WISDOM TOOTH EXTRACTION     Social History   Socioeconomic History  . Marital status: Married    Spouse name: sergio  . Number of children: 0  . Years of education: college  . Highest education level: Not on file  Occupational History  . Occupation: [employer] call center    Employer: [employer]  Tobacco Use  . Smoking status: Never Smoker  . Smokeless tobacco: Never Used  Vaping Use  . Vaping Use: Never used  Substance and Sexual Activity  . Alcohol use: No    Alcohol/week: 0.0 standard drinks  . Drug use: Yes    Types: Marijuana  . Sexual activity: Not Currently    Partners: Male    Birth control/protection: None  Other Topics Concern  . Not on file  Social History Narrative  . Not on file   Social Determinants of Health   Financial Resource Strain: Not on file  Food Insecurity: Food Insecurity Present  . Worried About Charity fundraiser in  the Last Year: Sometimes true  . Ran Out of Food in the Last Year: Sometimes true  Transportation Needs: No Transportation Needs  . Lack of Transportation (Medical): No  . Lack of Transportation (Non-Medical): No  Physical Activity: Not on file  Stress: Not on file  Social Connections: Not on file   Family History  Problem Relation Age of Onset  . Diabetes Mother   . Hypertension Father   . Colon cancer Paternal Grandmother        pt thinks PGM was dx in her 40's  . Diabetes Paternal Grandmother   .  Diabetes Maternal Grandmother   . Diabetes Maternal Grandfather   . Diabetes Paternal Grandfather   . Diabetes Other   . Breast cancer Paternal Aunt   . Esophageal cancer Neg Hx   . Liver cancer Neg Hx   . Pancreatic cancer Neg Hx   . Stomach cancer Neg Hx   . Rectal cancer Neg Hx    - negative except otherwise stated in the family history section Allergies  Allergen Reactions  . Other Anaphylaxis    Reaction to [place] nuts   . Lactose Intolerance (Gi) Diarrhea   Prior to Admission medications   Medication Sig Start Date End Date Taking? Authorizing Provider  aluminum-magnesium hydroxide-simethicone (MAALOX) [phone] MG/5ML SUSP Take 30 mLs by mouth 3 (three) times daily as needed (indigestion).    [provider]  busPIRone (BUSPAR) 5 MG tablet Take 1 tablet (5 mg total) by mouth 3 (three) times daily. 02/03/20   [name], MD  carvedilol (COREG) 12.5 MG tablet Take 1 tablet (12.5 mg total) by mouth 2 (two) times daily with a meal. 04/16/20 07/15/20  Dahal, Marlowe Aschoff, MD  Continuous Blood Gluc Sensor (DEXCOM G6 SENSOR) MISC USE AS DIRECTED WITH CONTINUOUS BLOOD GLUCOSE MONITOR. REPLACE EVERY 10 DAYS 02/27/20   Renato Shin, MD  Continuous Blood Gluc Transmit (DEXCOM G6 TRANSMITTER) MISC USE AS DIRECTED TO  MONITOR  GLUCOSE  LEVELS 05/10/20   Renato Shin, MD  cyclobenzaprine (FLEXERIL) 10 MG tablet TAKE 1 TABLET(10 MG) BY MOUTH THREE TIMES DAILY AS NEEDED FOR MUSCLE SPASMS 04/02/20   [name], MD  dicyclomine (BENTYL) 20 MG tablet Take 1 tablet (20 mg total) by mouth every 8 (eight) hours as needed for spasms. 11/25/19   Petrucelli, Samantha R, PA-C  gabapentin (NEURONTIN) 300 MG capsule Take 1 capsule (300 mg total) by mouth at bedtime. 04/20/20   [name], MD  hydrALAZINE (APRESOLINE) 50 MG tablet TAKE 1 TABLET(50 MG) BY MOUTH EVERY 6 HOURS Patient taking differently: Take 50 mg by mouth every 6 (six) hours. 01/30/20   [name], MD  Insulin NPH, Human,,  Isophane, (HUMULIN N KWIKPEN) 100 UNIT/ML Kiwkpen Inject 20 Units into the skin every morning. 03/02/20   Renato Shin, MD  isosorbide mononitrate (IMDUR) 30 MG 24 hr tablet Take 30 mg by mouth daily. 01/03/20   [provider]  loperamide (IMODIUM) 2 MG capsule Take 6 mg by mouth daily as needed for diarrhea or loose stools.     [provider]  LORazepam (ATIVAN) 0.5 MG tablet TAKE 1 TABLET(0.5 MG) BY MOUTH TWICE DAILY AS NEEDED FOR ANXIETY Patient taking differently: Take 0.5 mg by mouth 2 (two) times daily as needed for anxiety. 03/07/20   [name], MD  metoCLOPramide (REGLAN) 5 MG tablet Take 1 tablet (5 mg total) by mouth in the morning and at bedtime. Please keep your January appt for  further refills. Thank you 03/28/20   Armbruster, Carlota Raspberry, MD  mirtazapine (REMERON) 30 MG tablet TAKE 1 TABLET BY MOUTH AT  BEDTIME Patient taking differently: Take 30 mg by mouth at bedtime. 01/17/20   [name], MD  ondansetron (ZOFRAN ODT) 4 MG disintegrating tablet Take 1 tablet (4 mg total) by mouth every 8 (eight) hours as needed for nausea or vomiting. 04/06/20   Lucretia Kern, DO  oxyCODONE-acetaminophen (PERCOCET/ROXICET) 5-325 MG tablet Take 1 tablet by mouth every 4 (four) hours as needed for severe pain. 04/23/20   Persons, Bevely Palmer, PA  pantoprazole (PROTONIX) 40 MG tablet Take 1 tablet (40 mg total) by mouth 2 (two) times daily. 12/06/19   Armbruster, Carlota Raspberry, MD  promethazine (PHENERGAN) 25 MG suppository Place 1 suppository (25 mg total) rectally every 6 (six) hours as needed for nausea or vomiting. 03/20/20   Mesner, Corene Cornea, MD  promethazine (PHENERGAN) 25 MG tablet Take 1 tablet (25 mg total) by mouth every 6 (six) hours as needed for nausea or vomiting. 03/20/20   Mesner, Corene Cornea, MD  scopolamine (TRANSDERM-SCOP) 1 MG/3DAYS Place 1 patch onto the skin every 3 (three) days.    [provider]  sucralfate (CARAFATE) 1 GM/10ML suspension Take 10 mLs (1 g total) by  mouth every 6 (six) hours as needed. 11/24/19   Armbruster, Carlota Raspberry, MD  potassium chloride SA (KLOR-CON) 20 MEQ tablet Take 1 tablet (20 mEq total) by mouth daily for 5 doses. 07/04/19 07/30/19  Janeece Fitting, PA-C   DG Chest Portable 1 View  Result Date: 05/12/2020 CLINICAL DATA:  PICC placement. EXAM: PORTABLE CHEST 1 VIEW COMPARISON:  04/25/2020. FINDINGS: Right upper extremity PICC tip at the atrial caval junction. Increased heart size from prior exam. Patchy heterogeneous bilateral lung opacities, greatest in the suprahilar region. Possible right pleural effusion. No pneumothorax. IMPRESSION: 1. Right upper extremity PICC tip at the atrial caval junction. 2. Patchy heterogeneous bilateral lung opacities which may represent multifocal pneumonia or pulmonary edema. Increased heart size over the past 2 weeks, consider echocardiogram to assess for pericardial effusion. Electronically Signed   By: Keith Rake M.D.   On: 05/12/2020 01:17   - pertinent xrays, CT, MRI studies were reviewed and independently interpreted  Positive ROS: All other systems have been reviewed and were otherwise negative with the exception of those mentioned in the HPI and as above.  Physical Exam: General: Alert, no acute distress Psychiatric: Patient is competent for consent with normal mood and affect Lymphatic: No axillary or cervical lymphadenopathy Cardiovascular: No pedal edema Respiratory: No cyanosis, no use of accessory musculature GI: No organomegaly, abdomen is soft and non-tender    Images:  @ENCIMAGES @  Labs:  Lab Results  Component Value Date   HGBA1C 8.6 (H) 04/10/2020   HGBA1C 9.0 (A) 02/29/2020   HGBA1C 8.3 (H) 09/25/2019   ESRSEDRATE >140 (H) 04/09/2020   ESRSEDRATE 125 (H) 03/23/2017   ESRSEDRATE 67 (H) 11/26/2013   CRP 4.2 (H) 04/15/2020   CRP 7.1 (H) 04/14/2020   CRP 10.2 (H) 04/13/2020   LABURIC 6.8 (H) 01/23/2015   REPTSTATUS 04/14/2020 FINAL 04/09/2020   GRAMSTAIN  07/09/2017     FEW WBC PRESENT, PREDOMINANTLY PMN MODERATE GRAM POSITIVE RODS FEW GRAM POSITIVE COCCI RARE GRAM NEGATIVE RODS Performed at [facility], [place] [address] [place], [place] [address]    CULT  04/09/2020    NO GROWTH 5 DAYS Performed at [facility] [place],  [place] [id number]    LABORGA KLEBSIELLA PNEUMONIAE 03/01/2017    Lab Results  Component Value Date   ALBUMIN 1.6 (L) 05/12/2020   ALBUMIN 2.0 (L) 04/20/2020   ALBUMIN 1.8 (L) 04/15/2020   PREALBUMIN 17.9 (L) 06/09/2018   PREALBUMIN 21.0 02/18/2018   PREALBUMIN 18.1 03/18/2017   LABURIC 6.8 (H) 01/23/2015    Neurologic: Patient does not have protective sensation bilateral lower extremities.   MUSCULOSKELETAL:   Skin: Examination patient has complete wound dehiscence of the first ray amputation right foot there is black necrotic tissue at the base of the wound there is not any evidence of any recent debridement.  Patient has severe protein caloric malnutrition with albumin of 1.6 her hemoglobin upon admission was 3.7 with uncontrolled type 2 diabetes with most recent hemoglobin A1c 8.6.  She does have a palpable dorsalis pedis pulse however looking at her ankle-brachial indices she has calcified noncompressible vessels at the ankle with biphasic posterior tibial pulse monophasic dorsalis pedis posterior surgery and triphasic pulses on the left.  Change her dressing and  Patient has no ascending cellulitis she does have swelling and pain in the foot and leg.  Assessment: Assessment: Type 1 diabetes uncontrolled with peripheral vascular disease and severe protein caloric malnutrition with ischemic ulceration to her first ray amputation.  Plan: Plan: With the extent of the necrotic changes to the first ray amputation I do not feel that revascularization would allow for foot salvage intervention.  I feel her best option would be to proceed with a transtibial amputation of the right.  Will discuss this  with the patient over the next several days and will try to set up for surgery on Wednesday.  This should give time for patient to be stabilized.  Thank you for the consult and the opportunity to see Ms. [place], MD [place] [phone] 9:30 AM

## 2020-05-12 NOTE — ED Notes (Signed)
Pt up using bed side commode due to being unable to use a bed pan and unsuccessful with purewick.

## 2020-05-12 NOTE — Progress Notes (Signed)
Pharmacy Antibiotic Note  [name] is a 30 y.o. female admitted on 05/12/2020 with AMS, h/o RLE osteomyelitis s/p amputation 1/14 and I&D 2/2 . Patient is receiving antibiotics at home until 3/16, and pharmacy has been consulted to continue Daptomycin and Cipro while admitted. Plan now to cover for possible aspiration pneumonia - ciprofloxacin changed to unasyn.   WBC WNL, afebrile. Scr increasing from 1.78 to 1.9 (CrCl 44 mL/min). CXR showing patchy heterogeneous bilateral lung opacities - concerned for either multifocal PNA or pulmonary edema.   Plan: Daptomycin 500 mg IV q24h Unasyn 3 g every 6 hours  Monitor renal fx, cx results, clinical pic  Height: 5\' 7"  (170.2 cm) Weight: 71.2 kg (157 lb) IBW/kg (Calculated) : 61.6  Temp (24hrs), Avg:98.2 F (36.8 C), Min:97.6 F (36.4 C), Max:98.7 F (37.1 C)  Recent Labs  Lab 05/12/20 0041 05/12/20 0935  WBC 8.7 6.2  CREATININE 1.78*  --     Estimated Creatinine Clearance: 44.9 mL/min (A) (by C-G formula based on SCr of 1.78 mg/dL (H)).    Allergies  Allergen Reactions  . Other Anaphylaxis    Reaction to 07/10/20 nuts   . Lactose Intolerance (Gi) Diarrhea    [place], PharmD, BCCCP Clinical Pharmacist  Phone: [phone] 05/12/2020 10:37 AM  Please check AMION for all [facility] phone numbers After 10:00 PM, call Main Pharmacy [phone]

## 2020-05-12 NOTE — ED Provider Notes (Signed)
[facility] EMERGENCY DEPARTMENT Provider Note   CSN: [id number] Arrival date & time: 05/12/20  0022     History Chief Complaint  Patient presents with  . Altered Mental Status    [name] is a 30 y.o. female.  30 yo F here with a chief complaints of acute change in mental status. Has been found her and was very hard to wake her up. Last saw her about 830 this evening. She did endorse some drinking and had taken some pain medicine at home. Bradypnea. Patient without significant complaints. Able to wake up and tell me that she did drink tonight but does not think that is why she is so sleepy. Denies any other complaints.  The history is provided by the patient.  Altered Mental Status Presenting symptoms: confusion   Severity:  Moderate Most recent episode:  Today Episode history:  Continuous Duration:  4 hours Timing:  Constant Progression:  Unchanged Chronicity:  New Context: alcohol use and drug use   Associated symptoms: no fever, no headaches, no nausea, no palpitations and no vomiting        Past Medical History:  Diagnosis Date  . Allergy   . Anemia   . Anxiety   . Blood transfusion without reported diagnosis    Dec 2018  . Cataract    right eye  . COVID-19 03/2020  . Depression   . Diabetes type 1, uncontrolled ([place]) 11/14/2011   Since age 55  . Fibromyalgia   . Gastroparesis   . GERD (gastroesophageal reflux disease)   . Hypertension   . Infection    UTI April 2016    Patient Active Problem List   Diagnosis Date Noted  . Iron deficiency 04/11/2020  . Diabetic foot ulcer ([place]) 04/09/2020  . 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 Cannabinoid hyperemesis syndrome 04/30/2019  . Contraception management 08/06/2018  . Cyclical vomiting syndrome 06/07/2018  . Diabetic gastroparesis associated with type 1 diabetes mellitus ([place]) 06/04/2018  . Anxiety 05/31/2018  . Peripheral edema 05/31/2018  . Normocytic  anemia 07/10/2017  . GERD (gastroesophageal reflux disease) 06/23/2017  . Intractable nausea and vomiting 04/11/2017  . MDD (major depressive disorder), recurrent episode, mild ([place]) 02/28/2017  . Diabetes mellitus type 1 ([place]) 04/28/2013  . Protein-calorie malnutrition, severe ([place]) 04/28/2013  . Hypertension associated with diabetes ([place]) 11/14/2011  . Chronic abdominal pain 09/18/2011    Past Surgical History:  Procedure Laterality Date  . AMPUTATION Right 04/13/2020   Procedure: 1ST RAY AMPUTATION RIGHT FOOT;  Surgeon: Newt Minion, MD;  Location: [facility];  Service: Orthopedics;  Laterality: Right;  . ANKLE SURGERY    . CHOLECYSTECTOMY  11/15/2011   Procedure: LAPAROSCOPIC CHOLECYSTECTOMY WITH INTRAOPERATIVE CHOLANGIOGRAM;  Surgeon: [name], MD;  Location: [facility];  Service: General;  Laterality: N/A;  . COLONOSCOPY    . COLONOSCOPY WITH PROPOFOL N/A 06/27/2017   Procedure: COLONOSCOPY WITH PROPOFOL;  Surgeon: Milus Banister, MD;  Location: [facility];  Service: Endoscopy;  Laterality: N/A;  . ESOPHAGOGASTRODUODENOSCOPY  12/03/2011   Procedure: ESOPHAGOGASTRODUODENOSCOPY (EGD);  Surgeon: Beryle Beams, MD;  Location: [facility];  Service: Endoscopy;  Laterality: N/A;  . FLEXIBLE SIGMOIDOSCOPY N/A 03/10/2017   Procedure: FLEXIBLE SIGMOIDOSCOPY;  Surgeon: Carol Ada, MD;  Location: [facility];  Service: Endoscopy;  Laterality: N/A;  . INCISION AND DRAINAGE PERIRECTAL ABSCESS N/A 03/01/2017   Procedure: IRRIGATION AND DEBRIDEMENT PERIRECTAL ABSCESS;  Surgeon: Alphonsa Overall, MD;  Location: [facility];  Service: General;  Laterality: N/A;  . IRRIGATION AND DEBRIDEMENT BUTTOCKS N/A 03/23/2017   Procedure: IRRIGATION AND DEBRIDEMENT BUTTOCKS, SETON PLACEMENT;  Surgeon: [name], MD;  Location: [facility];  Service: General;  Laterality: N/A;  . LAPAROSCOPY  11/23/2011   Procedure: LAPAROSCOPY DIAGNOSTIC;  Surgeon: Edward Jolly, MD;  Location: [facility];  Service: General;   Laterality: N/A;  . SIGMOIDOSCOPY    . UPPER GASTROINTESTINAL ENDOSCOPY    . WISDOM TOOTH EXTRACTION       OB History    Gravida  1   Para  1   Term      Preterm  1   AB      Living  1     SAB      IAB      Ectopic      Multiple  0   Live Births  1           Family History  Problem Relation Age of Onset  . Diabetes Mother   . Hypertension Father   . Colon cancer Paternal Grandmother        pt thinks PGM was dx in her 106's  . Diabetes Paternal Grandmother   . Diabetes Maternal Grandmother   . Diabetes Maternal Grandfather   . Diabetes Paternal Grandfather   . Diabetes Other   . Breast cancer Paternal Aunt   . Esophageal cancer Neg Hx   . Liver cancer Neg Hx   . Pancreatic cancer Neg Hx   . Stomach cancer Neg Hx   . Rectal cancer Neg Hx     Social History   Tobacco Use  . Smoking status: Never Smoker  . Smokeless tobacco: Never Used  Vaping Use  . Vaping Use: Never used  Substance Use Topics  . Alcohol use: No    Alcohol/week: 0.0 standard drinks  . Drug use: Yes    Types: Marijuana    Home Medications Prior to Admission medications   Medication Sig Start Date End Date Taking? Authorizing Provider  aluminum-magnesium hydroxide-simethicone (MAALOX) 025-852-77 MG/5ML SUSP Take 30 mLs by mouth 3 (three) times daily as needed (indigestion).    [provider]  busPIRone (BUSPAR) 5 MG tablet Take 1 tablet (5 mg total) by mouth 3 (three) times daily. 02/03/20   [name], MD  carvedilol (COREG) 12.5 MG tablet Take 1 tablet (12.5 mg total) by mouth 2 (two) times daily with a meal. 04/16/20 07/15/20  Dahal, Marlowe Aschoff, MD  Continuous Blood Gluc Sensor (DEXCOM G6 SENSOR) MISC USE AS DIRECTED WITH CONTINUOUS BLOOD GLUCOSE MONITOR. REPLACE EVERY 10 DAYS 02/27/20   Renato Shin, MD  Continuous Blood Gluc Transmit (DEXCOM G6 TRANSMITTER) MISC USE AS DIRECTED TO  MONITOR  GLUCOSE  LEVELS 05/10/20   Renato Shin, MD  cyclobenzaprine (FLEXERIL) 10 MG  tablet TAKE 1 TABLET(10 MG) BY MOUTH THREE TIMES DAILY AS NEEDED FOR MUSCLE SPASMS 04/02/20   [name], MD  dicyclomine (BENTYL) 20 MG tablet Take 1 tablet (20 mg total) by mouth every 8 (eight) hours as needed for spasms. 11/25/19   Petrucelli, Samantha R, PA-C  gabapentin (NEURONTIN) 300 MG capsule Take 1 capsule (300 mg total) by mouth at bedtime. 04/20/20   [name], MD  hydrALAZINE (APRESOLINE) 50 MG tablet TAKE 1 TABLET(50 MG) BY MOUTH EVERY 6 HOURS Patient taking differently: Take 50 mg by mouth every 6 (six) hours. 01/30/20   [name], MD  Insulin NPH, Human,,  Isophane, (HUMULIN N KWIKPEN) 100 UNIT/ML Kiwkpen Inject 20 Units into the skin every morning. 03/02/20   Renato Shin, MD  isosorbide mononitrate (IMDUR) 30 MG 24 hr tablet Take 30 mg by mouth daily. 01/03/20   [provider]  loperamide (IMODIUM) 2 MG capsule Take 6 mg by mouth daily as needed for diarrhea or loose stools.     [provider]  LORazepam (ATIVAN) 0.5 MG tablet TAKE 1 TABLET(0.5 MG) BY MOUTH TWICE DAILY AS NEEDED FOR ANXIETY Patient taking differently: Take 0.5 mg by mouth 2 (two) times daily as needed for anxiety. 03/07/20   [name], MD  metoCLOPramide (REGLAN) 5 MG tablet Take 1 tablet (5 mg total) by mouth in the morning and at bedtime. Please keep your January appt for further refills. Thank you 03/28/20   Armbruster, Carlota Raspberry, MD  mirtazapine (REMERON) 30 MG tablet TAKE 1 TABLET BY MOUTH AT  BEDTIME Patient taking differently: Take 30 mg by mouth at bedtime. 01/17/20   [name], MD  ondansetron (ZOFRAN ODT) 4 MG disintegrating tablet Take 1 tablet (4 mg total) by mouth every 8 (eight) hours as needed for nausea or vomiting. 04/06/20   Lucretia Kern, DO  oxyCODONE-acetaminophen (PERCOCET/ROXICET) 5-325 MG tablet Take 1 tablet by mouth every 4 (four) hours as needed for severe pain. 04/23/20   Persons, Bevely Palmer, PA  pantoprazole (PROTONIX) 40 MG tablet Take 1 tablet (40 mg  total) by mouth 2 (two) times daily. 12/06/19   Armbruster, Carlota Raspberry, MD  promethazine (PHENERGAN) 25 MG suppository Place 1 suppository (25 mg total) rectally every 6 (six) hours as needed for nausea or vomiting. 03/20/20   Mesner, Corene Cornea, MD  promethazine (PHENERGAN) 25 MG tablet Take 1 tablet (25 mg total) by mouth every 6 (six) hours as needed for nausea or vomiting. 03/20/20   Mesner, Corene Cornea, MD  scopolamine (TRANSDERM-SCOP) 1 MG/3DAYS Place 1 patch onto the skin every 3 (three) days.    [provider]  sucralfate (CARAFATE) 1 GM/10ML suspension Take 10 mLs (1 g total) by mouth every 6 (six) hours as needed. 11/24/19   Armbruster, Carlota Raspberry, MD  potassium chloride SA (KLOR-CON) 20 MEQ tablet Take 1 tablet (20 mEq total) by mouth daily for 5 doses. 07/04/19 07/30/19  Janeece Fitting, PA-C    Allergies    Other and Lactose intolerance (gi)  Review of Systems   Review of Systems  Constitutional: Negative for chills and fever.  HENT: Negative for congestion and rhinorrhea.   Eyes: Negative for redness and visual disturbance.  Respiratory: Negative for shortness of breath and wheezing.   Cardiovascular: Negative for chest pain and palpitations.  Gastrointestinal: Negative for nausea and vomiting.  Genitourinary: Negative for dysuria and urgency.  Musculoskeletal: Negative for arthralgias and myalgias.  Skin: Negative for pallor and wound.  Neurological: Negative for dizziness and headaches.  Psychiatric/Behavioral: Positive for confusion.    Physical Exam Updated Vital Signs BP 135/88   Pulse 82   Temp 98.7 F (37.1 C) (Temporal)   Resp 15   SpO2 100%   Physical Exam Vitals and nursing note reviewed.  Constitutional:      General: She is not in acute distress.    Appearance: She is well-developed and well-nourished. She is not diaphoretic.  HENT:     Head: Normocephalic and atraumatic.  Eyes:     Extraocular Movements: EOM normal.     Pupils: Pupils are equal, round, and  reactive to light.  Comments: Pupils are 4 mm and reactive. Patient awakens to voice. Can answer some questions but with slurred speech.  Cardiovascular:     Rate and Rhythm: Normal rate and regular rhythm.     Heart sounds: No murmur heard. No friction rub. No gallop.   Pulmonary:     Effort: Pulmonary effort is normal. Bradypnea present.     Breath sounds: No wheezing or rales.  Abdominal:     General: There is no distension.     Palpations: Abdomen is soft.     Tenderness: There is no abdominal tenderness.  Musculoskeletal:        General: No tenderness or edema.     Cervical back: Normal range of motion and neck supple.     Comments: Right medial foot amputation site appears well-healing without any erythema or drainage.  Skin:    General: Skin is warm and dry.  Neurological:     Mental Status: She is alert and oriented to person, place, and time.  Psychiatric:        Mood and Affect: Mood and affect normal.        Behavior: Behavior normal.     ED Results / Procedures / Treatments   Labs (all labs ordered are listed, but only abnormal results are displayed) Labs Reviewed  CBC WITH DIFFERENTIAL/PLATELET - Abnormal; Notable for the following components:      Result Value   RBC 1.31 (*)    Hemoglobin 3.7 (*)    HCT 12.3 (*)    Platelets 147 (*)    Lymphs Abs 0.5 (*)    All other components within normal limits  COMPREHENSIVE METABOLIC PANEL - Abnormal; Notable for the following components:   CO2 21 (*)    Glucose, Bld 270 (*)    BUN 27 (*)    Creatinine, Ser 1.78 (*)    Calcium 7.9 (*)    Total Protein 5.8 (*)    Albumin 1.6 (*)    AST 100 (*)    ALT 85 (*)    GFR, Estimated 39 (*)    All other components within normal limits  URINALYSIS, ROUTINE W REFLEX MICROSCOPIC - Abnormal; Notable for the following components:   Glucose, UA >=500 (*)    Hgb urine dipstick SMALL (*)    Protein, ur >=300 (*)    Bacteria, UA RARE (*)    All other components within  normal limits  RAPID URINE DRUG SCREEN, HOSP PERFORMED - Abnormal; Notable for the following components:   Opiates POSITIVE (*)    Tetrahydrocannabinol POSITIVE (*)    All other components within normal limits  CBG MONITORING, ED - Abnormal; Notable for the following components:   Glucose-Capillary 256 (*)    All other components within normal limits  POC OCCULT BLOOD, ED - Abnormal; Notable for the following components:   Fecal Occult Bld POSITIVE (*)    All other components within normal limits  ETHANOL  PREPARE RBC (CROSSMATCH)  TYPE AND SCREEN    EKG EKG Interpretation  Date/Time:  Saturday May 12 2020 00:33:11 EST Ventricular Rate:  84 PR Interval:    QRS Duration: 78 QT Interval:  357 QTC Calculation: 422 R Axis:   47 Text Interpretation: Sinus rhythm No significant change since last tracing Confirmed by Deno Etienne [id number]) on 05/12/2020 12:57:05 AM   Radiology DG Chest Portable 1 View  Result Date: 05/12/2020 CLINICAL DATA:  PICC placement. EXAM: PORTABLE CHEST 1 VIEW COMPARISON:  04/25/2020. FINDINGS: Right upper extremity  PICC tip at the atrial caval junction. Increased heart size from prior exam. Patchy heterogeneous bilateral lung opacities, greatest in the suprahilar region. Possible right pleural effusion. No pneumothorax. IMPRESSION: 1. Right upper extremity PICC tip at the atrial caval junction. 2. Patchy heterogeneous bilateral lung opacities which may represent multifocal pneumonia or pulmonary edema. Increased heart size over the past 2 weeks, consider echocardiogram to assess for pericardial effusion. Electronically Signed   By: Keith Rake M.D.   On: 05/12/2020 01:17    Procedures Procedures   Medications Ordered in ED Medications  0.9 %  sodium chloride infusion (Manually program via Guardrails IV Fluids) (has no administration in time range)  pantoprazole (PROTONIX) injection 40 mg (40 mg Intravenous Given 05/12/20 0150)    ED Course  I have  reviewed the triage vital signs and the nursing notes.  Pertinent labs & imaging results that were available during my care of the patient were reviewed by me and considered in my medical decision making (see chart for details).    MDM Rules/Calculators/A&P                          30 yo F with a chief complaints of change in mental status. She does endorse drinking to me as well as to the EMS providers. Blood sugar in the 200s. Will obtain laboratory evaluation. Observe in the ED.  Patient found to have a hemoglobin of 3.7.  This is down significantly from her last check.  Rectal exam without any stool or gross blood.  Patient has an area on the chuck that appears bloody after her rectal temperature.  Will start on Protonix.  Message sent to [facility] on-call, Dr. Ardis Hughs, patient placed on their rounding list for the morning.  Discussed with medicine for admission.  CRITICAL CARE Performed by: Cecilio Asper   Total critical care time: 35 minutes  Critical care time was exclusive of separately billable procedures and treating other patients.  Critical care was necessary to treat or prevent imminent or life-threatening deterioration.  Critical care was time spent personally by me on the following activities: development of treatment plan with patient and/or surrogate as well as nursing, discussions with consultants, evaluation of patient's response to treatment, examination of patient, obtaining history from patient or surrogate, ordering and performing treatments and interventions, ordering and review of laboratory studies, ordering and review of radiographic studies, pulse oximetry and re-evaluation of patient's condition.  The patients results and plan were reviewed and discussed.   Any x-rays performed were independently reviewed by myself.   Differential diagnosis were considered with the presenting HPI.  Medications  0.9 %  sodium chloride infusion (Manually program via  Guardrails IV Fluids) (has no administration in time range)  pantoprazole (PROTONIX) injection 40 mg (40 mg Intravenous Given 05/12/20 0150)    Vitals:   05/12/20 0028 05/12/20 0100 05/12/20 0130  BP: 111/74 115/78 135/88  Pulse: 86 82 82  Resp: 10 17 15   Temp: 98.7 F (37.1 C)    TempSrc: Temporal    SpO2: 99% 100% 100%    Final diagnoses:  Disorientation  Acute upper GI bleed  Symptomatic anemia  AKI (acute kidney injury) ([place])    Admission/ observation were discussed with the admitting physician, patient and/or family and they are comfortable with the plan.    Final Clinical Impression(s) / ED Diagnoses Final diagnoses:  Disorientation  Acute upper GI bleed  Symptomatic anemia  Rx / DC Orders ED Discharge Orders    None       Deno Etienne, DO 05/12/20 0201

## 2020-05-12 NOTE — ED Notes (Signed)
Pt agreeable to take oral pain medications now after explaining she needs Tylenol for low grade temp.

## 2020-05-12 NOTE — Progress Notes (Signed)
Patient ID: [name], female   DOB: 06-18-1990, 30 y.o.   MRN: [id number] Patient admitted early this morning for confusion and lethargy and was found to have hemoglobin of 3.7; 2 units packed red cells transfusion was ordered.  GI has been also consulted.  Patient seen and examined at bedside.  She is more awake and appropriate.  I have reviewed patient medical records including this morning's H&P, current vitals, labs and medications myself.  Will check H&H 1 hour after second unit of transfusion and transfuse if hemoglobin is less than 7.  Await GI recommendations.  Continue antibiotics as per prior recommendations at discharge from [facility] on 05/09/2020.  I have consulted Dr. [name] as well.  For some reason, Covid testing was not done on admission.  We will test her for Covid and place her on isolation till then.  Repeat a.m. labs

## 2020-05-12 NOTE — ED Notes (Signed)
Pt given apple juice, cbg 88. Pt is alert and oriented. Pt refuses PRN oral pain medication but rates pain 10/10. She is still on phone waiting for her husband to come and still advises me she is signing out when he comes. I have explained the risk of her leaving, she understands she is on 6L of oxygen and still have lower oxygen around 88-91%. She will have no way to have oxygen if she leaves.

## 2020-05-12 NOTE — ED Notes (Signed)
Pts husband arrived and pt very adamant to sign out AMA. She was made aware of the risk and told her I would remove 02 to see how she does. After 5 minutes pts sats dropped into 30's she become altered and difficult to arouse.  Clearly unsafe for her to sign out ama due to become confused and altered upon removing o2. Husband agrees he is not taking her to [place] due to risks. MD made aware of pt being hypoxic. Currently has 7L [place] with NRB on 15L sats back to 92-93%. Pt is alert now and crying stating she wants to leave. MD aware.

## 2020-05-12 NOTE — ED Notes (Signed)
Pt requesting to sign out AMA. Admitting made aware. Pt is upset due to not receiving IV Pain medications. However pt is aware this is due to her history of altered mental status and low 02 sat's, currently on 6L [place] and remains 88-91%.

## 2020-05-12 NOTE — Consult Note (Signed)
Consultation  Referring Provider: Dr. Hanley BenAlekh     Primary Care Physician:  Ardith DarkParker, Caleb M, MD Primary Gastroenterologist: Dr. Paulo FruitArmrbuster         Reason for Consultation: Anemia             HPI:   [name] is a 30 y.o. 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    Initially history was garnered from patient's husband who stated he saw her at 8:30 PM on 05/11/2020 and when he returned home she was very hard to wake up, apparently had taken some pain medicine drink alcohol last night.  Patient describes she has been getting more tired over the past week with shortness of breath with exertion and was drifting in and out of sleep at time of interview but was aroused to voice.    At time of my interview with the patient she is constantly sobbing and really unable to answer any of my questions.  I asked if she is having any abdominal complaints and she says no.  I asked if she is seeing any blood in her stool and she says no.  Her sole problem right now is pain from her foot.  She tells me though that "they are not going to do anything about it".  Really does not offer any other history.    Denies fever.  ER course: Hemoglobin 3.7, creatinine 1.78, BUN 27, LFTs with an AST of 100, ALT 85, Covid pending  Recent hospitalizations: 1/10-1/17/2022 due to DFU, x-ray normal, MRI showed osteomyelitis, Dr. Due to amputated and did local tissue rearrangement for closure on 1/14 1/28-05/09/[facility] due to intractable nausea and vomiting and right foot pain, MRI showed subcutaneous abscess or phlegmon and signs of residual osteomyelitis, had residual right foot I&D, excised portion of the bone, cultures were negative, started on antibiotics  GI history: 10/05/2017 EGD Dr. Adela LankArmbruster: - Esophagogastric landmarks  identified. - Normal esophagus - A medium amount of food (residue) in the stomach consistent with gastroparesis - Normal stomach otherwise, no outlet obstruction, biopsies taken to rule out H pylori - Normal duodenal bulb and second portion of the duodenum. Biopsied.  06/27/2017 colonoscopy Dr. Christella HartiganJacobs: - The examined portion of the ileum was normal. - The entire examined colon is normal. - Biopsies were taken with a cold forceps from the entire colon for evaluation of microscopic colitis. - She does not have any signs of Crohn's disease or Ulcerative Colitis. I suspect she has non gastrointestinal (chronic) abdominal pains.  Past Medical History:  Diagnosis Date  . Allergy   . Anemia   . Anxiety   . Blood transfusion without reported diagnosis    Dec 2018  . Cataract    right eye  . COVID-19 03/2020  . Depression   . Diabetes type 1, uncontrolled (HCC) 11/14/2011   Since age 30  . Fibromyalgia   . Gastroparesis   . GERD (gastroesophageal reflux disease)   . Hypertension   . Infection    UTI April 2016    Past Surgical History:  Procedure Laterality Date  . AMPUTATION Right 04/13/2020   Procedure: 1ST RAY AMPUTATION RIGHT FOOT;  Surgeon: Nadara Mustarduda, Marcus V, MD;  [facility];  Service: Orthopedics;  Laterality: Right;  . ANKLE SURGERY    . CHOLECYSTECTOMY  11/15/2011  Procedure: LAPAROSCOPIC CHOLECYSTECTOMY WITH INTRAOPERATIVE CHOLANGIOGRAM;  Surgeon: [name], MD;  Location: [facility] ORS;  Service: General;  Laterality: N/A;  . COLONOSCOPY    . COLONOSCOPY WITH PROPOFOL N/A 06/27/2017   Procedure: COLONOSCOPY WITH PROPOFOL;  Surgeon: Rachael Fee, MD;  Location: [facility] ENDOSCOPY;  Service: Endoscopy;  Laterality: N/A;  . ESOPHAGOGASTRODUODENOSCOPY  12/03/2011   Procedure: ESOPHAGOGASTRODUODENOSCOPY (EGD);  Surgeon: Theda Belfast, MD;  Location: [facility];  Service: Endoscopy;  Laterality: N/A;  . FLEXIBLE SIGMOIDOSCOPY N/A 03/10/2017   Procedure: FLEXIBLE SIGMOIDOSCOPY;   Surgeon: Jeani Hawking, MD;  Location: [facility] ENDOSCOPY;  Service: Endoscopy;  Laterality: N/A;  . INCISION AND DRAINAGE PERIRECTAL ABSCESS N/A 03/01/2017   Procedure: IRRIGATION AND DEBRIDEMENT PERIRECTAL ABSCESS;  Surgeon: Ovidio Kin, MD;  Location: [facility] ORS;  Service: General;  Laterality: N/A;  . IRRIGATION AND DEBRIDEMENT BUTTOCKS N/A 03/23/2017   Procedure: IRRIGATION AND DEBRIDEMENT BUTTOCKS, SETON PLACEMENT;  Surgeon: Romie Levee, MD;  Location: [facility] ORS;  Service: General;  Laterality: N/A;  . LAPAROSCOPY  11/23/2011   Procedure: LAPAROSCOPY DIAGNOSTIC;  Surgeon: Mariella Saa, MD;  Location: [facility] ORS;  Service: General;  Laterality: N/A;  . SIGMOIDOSCOPY    . UPPER GASTROINTESTINAL ENDOSCOPY    . WISDOM TOOTH EXTRACTION      Family History  Problem Relation Age of Onset  . Diabetes Mother   . Hypertension Father   . Colon cancer Paternal Grandmother        pt thinks PGM was dx in her 93's  . Diabetes Paternal Grandmother   . Diabetes Maternal Grandmother   . Diabetes Maternal Grandfather   . Diabetes Paternal Grandfather   . Diabetes Other   . Breast cancer Paternal Aunt   . Esophageal cancer Neg Hx   . Liver cancer Neg Hx   . Pancreatic cancer Neg Hx   . Stomach cancer Neg Hx   . Rectal cancer Neg Hx     Social History   Tobacco Use  . Smoking status: Never Smoker  . Smokeless tobacco: Never Used  Vaping Use  . Vaping Use: Never used  Substance Use Topics  . Alcohol use: No    Alcohol/week: 0.0 standard drinks  . Drug use: Yes    Types: Marijuana    Prior to Admission medications   Medication Sig Start Date End Date Taking? Authorizing Provider  aluminum-magnesium hydroxide-simethicone (MAALOX) 200-200-20 MG/5ML SUSP Take 30 mLs by mouth 3 (three) times daily as needed (indigestion).    [provider]  busPIRone (BUSPAR) 5 MG tablet Take 1 tablet (5 mg total) by mouth 3 (three) times daily. 02/03/20   Ardith Dark, MD  carvedilol (COREG) 12.5 MG  tablet Take 1 tablet (12.5 mg total) by mouth 2 (two) times daily with a meal. 04/16/20 07/15/20  Dahal, Melina Schools, MD  Continuous Blood Gluc Sensor (DEXCOM G6 SENSOR) MISC USE AS DIRECTED WITH CONTINUOUS BLOOD GLUCOSE MONITOR. REPLACE EVERY 10 DAYS 02/27/20   Romero Belling, MD  Continuous Blood Gluc Transmit (DEXCOM G6 TRANSMITTER) MISC USE AS DIRECTED TO  MONITOR  GLUCOSE  LEVELS 05/10/20   Romero Belling, MD  cyclobenzaprine (FLEXERIL) 10 MG tablet TAKE 1 TABLET(10 MG) BY MOUTH THREE TIMES DAILY AS NEEDED FOR MUSCLE SPASMS 04/02/20   Ardith Dark, MD  dicyclomine (BENTYL) 20 MG tablet Take 1 tablet (20 mg total) by mouth every 8 (eight) hours as needed for spasms. 11/25/19   Petrucelli, Samantha R, PA-C  gabapentin (NEURONTIN) 300 MG capsule Take 1 capsule (  300 mg total) by mouth at bedtime. 04/20/20   Ardith Dark, MD  hydrALAZINE (APRESOLINE) 50 MG tablet TAKE 1 TABLET(50 MG) BY MOUTH EVERY 6 HOURS Patient taking differently: Take 50 mg by mouth every 6 (six) hours. 01/30/20   Ardith Dark, MD  Insulin NPH, Human,, Isophane, (HUMULIN N KWIKPEN) 100 UNIT/ML Kiwkpen Inject 20 Units into the skin every morning. 03/02/20   Romero Belling, MD  isosorbide mononitrate (IMDUR) 30 MG 24 hr tablet Take 30 mg by mouth daily. 01/03/20   [provider]  loperamide (IMODIUM) 2 MG capsule Take 6 mg by mouth daily as needed for diarrhea or loose stools.     [provider]  LORazepam (ATIVAN) 0.5 MG tablet TAKE 1 TABLET(0.5 MG) BY MOUTH TWICE DAILY AS NEEDED FOR ANXIETY Patient taking differently: Take 0.5 mg by mouth 2 (two) times daily as needed for anxiety. 03/07/20   Ardith Dark, MD  metoCLOPramide (REGLAN) 5 MG tablet Take 1 tablet (5 mg total) by mouth in the morning and at bedtime. Please keep your January appt for further refills. Thank you 03/28/20   Armbruster, Willaim Rayas, MD  mirtazapine (REMERON) 30 MG tablet TAKE 1 TABLET BY MOUTH AT  BEDTIME Patient taking differently: Take 30 mg by  mouth at bedtime. 01/17/20   Ardith Dark, MD  ondansetron (ZOFRAN ODT) 4 MG disintegrating tablet Take 1 tablet (4 mg total) by mouth every 8 (eight) hours as needed for nausea or vomiting. 04/06/20   Terressa Koyanagi, DO  oxyCODONE-acetaminophen (PERCOCET/ROXICET) 5-325 MG tablet Take 1 tablet by mouth every 4 (four) hours as needed for severe pain. 04/23/20   Persons, [place], PA  pantoprazole (PROTONIX) 40 MG tablet Take 1 tablet (40 mg total) by mouth 2 (two) times daily. 12/06/19   Armbruster, Willaim Rayas, MD  promethazine (PHENERGAN) 25 MG suppository Place 1 suppository (25 mg total) rectally every 6 (six) hours as needed for nausea or vomiting. 03/20/20   Mesner, Barbara Cower, MD  promethazine (PHENERGAN) 25 MG tablet Take 1 tablet (25 mg total) by mouth every 6 (six) hours as needed for nausea or vomiting. 03/20/20   Mesner, Barbara Cower, MD  scopolamine (TRANSDERM-SCOP) 1 MG/3DAYS Place 1 patch onto the skin every 3 (three) days.    [provider]  sucralfate (CARAFATE) 1 GM/10ML suspension Take 10 mLs (1 g total) by mouth every 6 (six) hours as needed. 11/24/19   Armbruster, Willaim Rayas, MD  potassium chloride SA (KLOR-CON) 20 MEQ tablet Take 1 tablet (20 mEq total) by mouth daily for 5 doses. 07/04/19 07/30/19  Claude Manges, PA-C    Current Facility-Administered Medications  Medication Dose Route Frequency Provider Last Rate Last Admin  . acetaminophen (TYLENOL) tablet 650 mg  650 mg Oral Q6H PRN Chotiner, Claudean Severance, MD       Or  . acetaminophen (TYLENOL) suppository 650 mg  650 mg Rectal Q6H PRN Chotiner, Claudean Severance, MD      . carvedilol (COREG) tablet 12.5 mg  12.5 mg Oral BID WC Chotiner, Claudean Severance, MD   12.5 mg at 05/12/20 0758  . ciprofloxacin (CIPRO) tablet 500 mg  500 mg Oral BID Chotiner, Claudean Severance, MD      . DAPTOmycin (CUBICIN) 500 mg in sodium chloride 0.9 % IVPB  500 mg Intravenous Q2000 Chotiner, Claudean Severance, MD      . gabapentin (NEURONTIN) capsule 300 mg  300 mg Oral QHS Chotiner, Claudean Severance, MD      .  hydrALAZINE (APRESOLINE) tablet 50 mg  50 mg Oral Q6H Chotiner, Claudean Severance, MD      . insulin aspart (novoLOG) injection 0-15 Units  0-15 Units Subcutaneous TID WC Chotiner, Claudean Severance, MD   3 Units at 05/12/20 [phone]  . insulin aspart (novoLOG) injection 0-5 Units  0-5 Units Subcutaneous QHS Chotiner, Claudean Severance, MD      . insulin NPH Human (NOVOLIN N) injection 20 Units  20 Units Subcutaneous QAC breakfast Chotiner, Claudean Severance, MD      . lactated ringers infusion   Intravenous Continuous Chotiner, Claudean Severance, MD   Held at 05/12/20 [phone]  . ondansetron (ZOFRAN) tablet 4 mg  4 mg Oral Q6H PRN Chotiner, Claudean Severance, MD       Or  . ondansetron [facility]) injection 4 mg  4 mg Intravenous Q6H PRN Chotiner, Claudean Severance, MD   4 mg at 05/12/20 0842  . oxyCODONE-acetaminophen (PERCOCET/ROXICET) 5-325 MG per tablet 1 tablet  1 tablet Oral Q6H PRN Glade Lloyd, MD   1 tablet at 05/12/20 0759  . pantoprazole (PROTONIX) injection 40 mg  40 mg Intravenous Q24H Chotiner, Claudean Severance, MD   40 mg at 05/12/20 0532   Current Outpatient Medications  Medication Sig Dispense Refill  . aluminum-magnesium hydroxide-simethicone (MAALOX) 200-200-20 MG/5ML SUSP Take 30 mLs by mouth 3 (three) times daily as needed (indigestion).    . busPIRone (BUSPAR) 5 MG tablet Take 1 tablet (5 mg total) by mouth 3 (three) times daily. 90 tablet 5  . carvedilol (COREG) 12.5 MG tablet Take 1 tablet (12.5 mg total) by mouth 2 (two) times daily with a meal. 60 tablet 2  . Continuous Blood Gluc Sensor (DEXCOM G6 SENSOR) MISC USE AS DIRECTED WITH CONTINUOUS BLOOD GLUCOSE MONITOR. REPLACE EVERY 10 DAYS 3 each 1  . Continuous Blood Gluc Transmit (DEXCOM G6 TRANSMITTER) MISC USE AS DIRECTED TO  MONITOR  GLUCOSE  LEVELS 1 each 0  . cyclobenzaprine (FLEXERIL) 10 MG tablet TAKE 1 TABLET(10 MG) BY MOUTH THREE TIMES DAILY AS NEEDED FOR MUSCLE SPASMS 30 tablet 0  . dicyclomine (BENTYL) 20 MG tablet Take 1 tablet (20 mg total) by mouth every 8 (eight)  hours as needed for spasms. 15 tablet 0  . gabapentin (NEURONTIN) 300 MG capsule Take 1 capsule (300 mg total) by mouth at bedtime. 90 capsule 3  . hydrALAZINE (APRESOLINE) 50 MG tablet TAKE 1 TABLET(50 MG) BY MOUTH EVERY 6 HOURS (Patient taking differently: Take 50 mg by mouth every 6 (six) hours.) 120 tablet 0  . Insulin NPH, Human,, Isophane, (HUMULIN N KWIKPEN) 100 UNIT/ML Kiwkpen Inject 20 Units into the skin every morning. 30 mL 11  . isosorbide mononitrate (IMDUR) 30 MG 24 hr tablet Take 30 mg by mouth daily.    Marland Kitchen loperamide (IMODIUM) 2 MG capsule Take 6 mg by mouth daily as needed for diarrhea or loose stools.     Marland Kitchen LORazepam (ATIVAN) 0.5 MG tablet TAKE 1 TABLET(0.5 MG) BY MOUTH TWICE DAILY AS NEEDED FOR ANXIETY (Patient taking differently: Take 0.5 mg by mouth 2 (two) times daily as needed for anxiety.) 60 tablet 1  . metoCLOPramide (REGLAN) 5 MG tablet Take 1 tablet (5 mg total) by mouth in the morning and at bedtime. Please keep your January appt for further refills. Thank you 60 tablet 0  . mirtazapine (REMERON) 30 MG tablet TAKE 1 TABLET BY MOUTH AT  BEDTIME (Patient taking differently: Take 30 mg by mouth at bedtime.) 30 tablet 11  . ondansetron (  ZOFRAN ODT) 4 MG disintegrating tablet Take 1 tablet (4 mg total) by mouth every 8 (eight) hours as needed for nausea or vomiting. 10 tablet 0  . oxyCODONE-acetaminophen (PERCOCET/ROXICET) 5-325 MG tablet Take 1 tablet by mouth every 4 (four) hours as needed for severe pain. 30 tablet 0  . pantoprazole (PROTONIX) 40 MG tablet Take 1 tablet (40 mg total) by mouth 2 (two) times daily. 180 tablet 2  . promethazine (PHENERGAN) 25 MG suppository Place 1 suppository (25 mg total) rectally every 6 (six) hours as needed for nausea or vomiting. 12 each 0  . promethazine (PHENERGAN) 25 MG tablet Take 1 tablet (25 mg total) by mouth every 6 (six) hours as needed for nausea or vomiting. 30 tablet 0  . scopolamine (TRANSDERM-SCOP) 1 MG/3DAYS Place 1 patch  onto the skin every 3 (three) days.    . sucralfate (CARAFATE) 1 GM/10ML suspension Take 10 mLs (1 g total) by mouth every 6 (six) hours as needed. 420 mL 1    Allergies as of 05/12/2020 - Review Complete 05/12/2020  Allergen Reaction Noted  . Other Anaphylaxis 12/13/2011  . Lactose intolerance (gi) Diarrhea 01/22/2015     Review of Systems:    Unable to complete- patient tearful and not answering questions   Physical Exam:  Vital signs in last 24 hours: Temp:  [97.6 F (36.4 C)-98.7 F (37.1 C)] 97.6 F (36.4 C) (02/12 0918) Pulse Rate:  [78-89] 88 (02/12 0900) Resp:  [8-20] 20 (02/12 0900) BP: (111-163)/(74-99) 144/94 (02/12 0900) SpO2:  [82 %-100 %] 82 % (02/12 0900)   General:   Distressed, tearful AA female, Well developed, Well nourished, alert  Head:  Normocephalic and atraumatic. Eyes:   PEERL, EOMI. No icterus. Conjunctiva pink. Ears:  Normal auditory acuity. Neck:  Supple Throat: Oral cavity and pharynx without inflammation, swelling or lesion.  Lungs: Respirations even and unlabored. Lungs clear to auscultation bilaterally.   No wheezes, crackles, or rhonchi.  Heart: Normal S1, S2. No MRG. Regular rate and rhythm. No peripheral edema, cyanosis or pallor.  Abdomen:  Soft, nondistended, nontender. No rebound or guarding. Normal bowel sounds. No appreciable masses or hepatomegaly. Rectal:  Not performed.  Msk:  Symmetrical without gross deformities. Peripheral pulses intact.  Extremities:  Rt foot amputation Neurologic:  Alert and  oriented x4;  grossly normal neurologically.  Skin:   Dry and intact without significant lesions or rashes. Psychiatric: Crying   LAB RESULTS: Recent Labs    05/12/20 0041  WBC 8.7  HGB 3.7*  HCT 12.3*  PLT 147*   BMET Recent Labs    05/12/20 0041  NA 135  K 3.9  CL 104  CO2 21*  GLUCOSE 270*  BUN 27*  CREATININE 1.78*  CALCIUM 7.9*   LFT Recent Labs    05/12/20 0041  PROT 5.8*  ALBUMIN 1.6*  AST 100*  ALT 85*   ALKPHOS 120  BILITOT 0.5   STUDIES: DG Chest Portable 1 View  Result Date: 05/12/2020 CLINICAL DATA:  PICC placement. EXAM: PORTABLE CHEST 1 VIEW COMPARISON:  04/25/2020. FINDINGS: Right upper extremity PICC tip at the atrial caval junction. Increased heart size from prior exam. Patchy heterogeneous bilateral lung opacities, greatest in the suprahilar region. Possible right pleural effusion. No pneumothorax. IMPRESSION: 1. Right upper extremity PICC tip at the atrial caval junction. 2. Patchy heterogeneous bilateral lung opacities which may represent multifocal pneumonia or pulmonary edema. Increased heart size over the past 2 weeks, consider echocardiogram to assess for pericardial effusion.  Electronically Signed   By: Narda Rutherford M.D.   On: 05/12/2020 01:17    Impression / Plan:   Impression: 1.  Anemia: Hemoglobin 3.7--> 2 units PRBCs ordered, patient unable to tell me if she has been seeing any blood in her stools; consider most likely GI bleed 2.  Diarrhea: Does not answer any questions about this for me, GI pathogen panel pending 3.  AKI 4.  Osteomyelitis: Plans for transtibial amputation of the right on Wednesday  Plan: 1.  Patient will need EGD and colonoscopy at some point during hospitalization, certainly not today or tomorrow given severe anemia. 2.  Continue to monitor hemoglobin and transfusion as needed less than 7 3.  Please await any further recommendations from Dr. Adela Lank later today.  Thank you for your kind consultation, we will continue to follow.  Violet Baldy Tyrees Chopin  05/12/2020, 10:10 AM

## 2020-05-12 NOTE — Progress Notes (Signed)
Pharmacy Antibiotic Note  [name] is a 30 y.o. female admitted on 05/12/2020 with AMS, h/o RLE osteomyelitis s/p amputation 1/14 and I&D 2/2 . Patient is receiving antibiotics at home until 3/16, and pharmacy has been consulted to continue Daptomycin and Cipro while admitted.   Plan: Daptomycin 500 mg IV q24h Cipro 400 mg IV this morning, then Cipro 500 po BID      Temp (24hrs), Avg:98.7 F (37.1 C), Min:98.7 F (37.1 C), Max:98.7 F (37.1 C)  Recent Labs  Lab 05/12/20 0041  WBC 8.7  CREATININE 1.78*    CrCl cannot be calculated (Unknown ideal weight.).    Allergies  Allergen Reactions  . Other Anaphylaxis    Reaction to [place] nuts   . Lactose Intolerance (Gi) Diarrhea    Caryl Pina 05/12/2020 2:48 AM

## 2020-05-12 NOTE — H&P (Signed)
History and Physical    [name] HYW:[id number] DOB: March 13, 1991 DOA: 05/12/2020  PCP: [name], MD   Patient coming from: Home  Chief Complaint: confusion, lethargy  HPI: [name] is a 30 y.o. female with medical history significant for DMT1, gastroparesis, HTN, fibromyalgia, osteomyelitis of foot, anemia who presents for evaluation of decreased responsiveness, lethargy and confusion. Husband states he saw her at 68 pm on 05/11/20 and when he returned home she was very hard to wake up. She will arouse to voice and tactile stimulation. She states she took pain mediation and drank some alcohol last night. She does not think that is why she is so tired. She states she has been getting more tired over the past week with SOB with exertion. She drifts in and out of sleep but will arouse to voice during interview.  Family is at bedside at this time.  Patient can only give a limited history due to to current condition. Admitted 1/10-1/17/22 at [facility] due to DFU. XR normal. 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 Dr. [name] amputated and did local tissue rearrangement for closure on 1/14. Not discharged on abx as margins were clean.  04/24/20 Saw Dr. [name] had some superficial dehiscence.  Admitted 1/28-05/09/20 at [facility] due to intractable nausea/vomiting and R foot pain. MRI showed subcutaneous abscess or phlegmon and signs of residual osteomyelitis. She had residual R foot I&D. Excised portion of cuneiform bone. Cultures were negative. Started on daptomycin and ciprofloxacin until 06/13/20.    Review of Systems:  General: Reports lethargy.  Denies fever, chills, weight loss, night sweats.  Denies dizziness.  Denies change in appetite HENT: Denies head trauma, headache, denies change in hearing. Denies nasal congestion or bleeding.  Denies sore throat, sores in mouth.  Denies difficulty swallowing Eyes: Denies blurry vision, pain in eye, drainage.  Denies discoloration of eyes. Neck: Denies pain.   Denies swelling.  Denies pain with movement. Cardiovascular: Denies chest pain, palpitations.  Denies edema.  Denies orthopnea Respiratory: Denies shortness of breath, cough.  Denies wheezing.  Denies sputum production Gastrointestinal: Denies abdominal pain, swelling.  Denies nausea, vomiting.  Denies melena.  Denies hematemesis. Musculoskeletal: Denies limitation of movement.  Genitourinary: Denies pelvic pain.  Denies urinary frequency or hesitancy.  Denies dysuria.  Skin: Denies rash.  Denies petechiae, purpura, ecchymosis. Neurological: Denies headache. Denies syncope. Denies seizure activity. Denies slurred speech, drooping face.  Denies visual change. Psychiatric: Denies depression, anxiety. Denies hallucinations.  Past Medical History:  Diagnosis Date  . Allergy   . Anemia   . Anxiety   . Blood transfusion without reported diagnosis    Dec 2018  . Cataract    right eye  . COVID-19 03/2020  . Depression   . Diabetes type 1, uncontrolled ([place]) 11/14/2011   Since age 58  . Fibromyalgia   . Gastroparesis   . GERD (gastroesophageal reflux disease)   . Hypertension   . Infection    UTI April 2016    Past Surgical History:  Procedure Laterality Date  . AMPUTATION Right 04/13/2020   Procedure: 1ST RAY AMPUTATION RIGHT FOOT;  Surgeon: Newt Minion, MD;  Location: [place];  Service: Orthopedics;  Laterality: Right;  . ANKLE SURGERY    . CHOLECYSTECTOMY  11/15/2011   Procedure: LAPAROSCOPIC CHOLECYSTECTOMY WITH INTRAOPERATIVE CHOLANGIOGRAM;  Surgeon: [name], MD;  Location: [facility];  Service: General;  Laterality: N/A;  . COLONOSCOPY    . COLONOSCOPY WITH PROPOFOL N/A 06/27/2017   Procedure:  COLONOSCOPY WITH PROPOFOL;  Surgeon: Milus Banister, MD;  Location: [facility];  Service: Endoscopy;  Laterality: N/A;  . ESOPHAGOGASTRODUODENOSCOPY  12/03/2011   Procedure: ESOPHAGOGASTRODUODENOSCOPY (EGD);  Surgeon: Beryle Beams, MD;  Location: [facility];  Service: Endoscopy;   Laterality: N/A;  . FLEXIBLE SIGMOIDOSCOPY N/A 03/10/2017   Procedure: FLEXIBLE SIGMOIDOSCOPY;  Surgeon: Carol Ada, MD;  Location: [facility];  Service: Endoscopy;  Laterality: N/A;  . INCISION AND DRAINAGE PERIRECTAL ABSCESS N/A 03/01/2017   Procedure: IRRIGATION AND DEBRIDEMENT PERIRECTAL ABSCESS;  Surgeon: Alphonsa Overall, MD;  Location: [facility];  Service: General;  Laterality: N/A;  . IRRIGATION AND DEBRIDEMENT BUTTOCKS N/A 03/23/2017   Procedure: IRRIGATION AND DEBRIDEMENT BUTTOCKS, SETON PLACEMENT;  Surgeon: [name], MD;  Location: [facility];  Service: General;  Laterality: N/A;  . LAPAROSCOPY  11/23/2011   Procedure: LAPAROSCOPY DIAGNOSTIC;  Surgeon: Edward Jolly, MD;  Location: [facility];  Service: General;  Laterality: N/A;  . SIGMOIDOSCOPY    . UPPER GASTROINTESTINAL ENDOSCOPY    . WISDOM TOOTH EXTRACTION      Social History  reports that she has never smoked. She has never used smokeless tobacco. She reports current drug use. Drug: Marijuana. She reports that she does not drink alcohol.  Allergies  Allergen Reactions  . Other Anaphylaxis    Reaction to [place] nuts   . Lactose Intolerance (Gi) Diarrhea    Family History  Problem Relation Age of Onset  . Diabetes Mother   . Hypertension Father   . Colon cancer Paternal Grandmother        pt thinks PGM was dx in her 35's  . Diabetes Paternal Grandmother   . Diabetes Maternal Grandmother   . Diabetes Maternal Grandfather   . Diabetes Paternal Grandfather   . Diabetes Other   . Breast cancer Paternal Aunt   . Esophageal cancer Neg Hx   . Liver cancer Neg Hx   . Pancreatic cancer Neg Hx   . Stomach cancer Neg Hx   . Rectal cancer Neg Hx      Prior to Admission medications   Medication Sig Start Date End Date Taking? Authorizing Provider  aluminum-magnesium hydroxide-simethicone (MAALOX) [phone] MG/5ML SUSP Take 30 mLs by mouth 3 (three) times daily as needed (indigestion).    [provider]   busPIRone (BUSPAR) 5 MG tablet Take 1 tablet (5 mg total) by mouth 3 (three) times daily. 02/03/20   [name], MD  carvedilol (COREG) 12.5 MG tablet Take 1 tablet (12.5 mg total) by mouth 2 (two) times daily with a meal. 04/16/20 07/15/20  Dahal, Marlowe Aschoff, MD  Continuous Blood Gluc Sensor (DEXCOM G6 SENSOR) MISC USE AS DIRECTED WITH CONTINUOUS BLOOD GLUCOSE MONITOR. REPLACE EVERY 10 DAYS 02/27/20   Renato Shin, MD  Continuous Blood Gluc Transmit (DEXCOM G6 TRANSMITTER) MISC USE AS DIRECTED TO  MONITOR  GLUCOSE  LEVELS 05/10/20   Renato Shin, MD  cyclobenzaprine (FLEXERIL) 10 MG tablet TAKE 1 TABLET(10 MG) BY MOUTH THREE TIMES DAILY AS NEEDED FOR MUSCLE SPASMS 04/02/20   [name], MD  dicyclomine (BENTYL) 20 MG tablet Take 1 tablet (20 mg total) by mouth every 8 (eight) hours as needed for spasms. 11/25/19   Petrucelli, Samantha R, PA-C  gabapentin (NEURONTIN) 300 MG capsule Take 1 capsule (300 mg total) by mouth at bedtime. 04/20/20   [name], MD  hydrALAZINE (APRESOLINE) 50 MG tablet TAKE 1 TABLET(50 MG) BY MOUTH EVERY 6 HOURS Patient taking differently: Take 50 mg by  mouth every 6 (six) hours. 01/30/20   [name], MD  Insulin NPH, Human,, Isophane, (HUMULIN N KWIKPEN) 100 UNIT/ML Kiwkpen Inject 20 Units into the skin every morning. 03/02/20   Renato Shin, MD  isosorbide mononitrate (IMDUR) 30 MG 24 hr tablet Take 30 mg by mouth daily. 01/03/20   [provider]  loperamide (IMODIUM) 2 MG capsule Take 6 mg by mouth daily as needed for diarrhea or loose stools.     [provider]  LORazepam (ATIVAN) 0.5 MG tablet TAKE 1 TABLET(0.5 MG) BY MOUTH TWICE DAILY AS NEEDED FOR ANXIETY Patient taking differently: Take 0.5 mg by mouth 2 (two) times daily as needed for anxiety. 03/07/20   [name], MD  metoCLOPramide (REGLAN) 5 MG tablet Take 1 tablet (5 mg total) by mouth in the morning and at bedtime. Please keep your January appt for further refills. Thank you  03/28/20   Armbruster, Carlota Raspberry, MD  mirtazapine (REMERON) 30 MG tablet TAKE 1 TABLET BY MOUTH AT  BEDTIME Patient taking differently: Take 30 mg by mouth at bedtime. 01/17/20   [name], MD  ondansetron (ZOFRAN ODT) 4 MG disintegrating tablet Take 1 tablet (4 mg total) by mouth every 8 (eight) hours as needed for nausea or vomiting. 04/06/20   Lucretia Kern, DO  oxyCODONE-acetaminophen (PERCOCET/ROXICET) 5-325 MG tablet Take 1 tablet by mouth every 4 (four) hours as needed for severe pain. 04/23/20   Persons, Bevely Palmer, PA  pantoprazole (PROTONIX) 40 MG tablet Take 1 tablet (40 mg total) by mouth 2 (two) times daily. 12/06/19   Armbruster, Carlota Raspberry, MD  promethazine (PHENERGAN) 25 MG suppository Place 1 suppository (25 mg total) rectally every 6 (six) hours as needed for nausea or vomiting. 03/20/20   Mesner, Corene Cornea, MD  promethazine (PHENERGAN) 25 MG tablet Take 1 tablet (25 mg total) by mouth every 6 (six) hours as needed for nausea or vomiting. 03/20/20   Mesner, Corene Cornea, MD  scopolamine (TRANSDERM-SCOP) 1 MG/3DAYS Place 1 patch onto the skin every 3 (three) days.    [provider]  sucralfate (CARAFATE) 1 GM/10ML suspension Take 10 mLs (1 g total) by mouth every 6 (six) hours as needed. 11/24/19   Armbruster, Carlota Raspberry, MD  potassium chloride SA (KLOR-CON) 20 MEQ tablet Take 1 tablet (20 mEq total) by mouth daily for 5 doses. 07/04/19 07/30/19  Janeece Fitting, PA-C    Physical Exam: Vitals:   05/12/20 0028 05/12/20 0100 05/12/20 0130  BP: 111/74 115/78 135/88  Pulse: 86 82 82  Resp: 10 17 15   Temp: 98.7 F (37.1 C)    TempSrc: Temporal    SpO2: 99% 100% 100%    Constitutional: NAD, calm, comfortable Vitals:   05/12/20 0028 05/12/20 0100 05/12/20 0130  BP: 111/74 115/78 135/88  Pulse: 86 82 82  Resp: 10 17 15   Temp: 98.7 F (37.1 C)    TempSrc: Temporal    SpO2: 99% 100% 100%   General: WDWN, Alert and oriented x3. Drowsy but awakens to voice Eyes: EOMI, PERRL, conjunctivae  pale.  Sclera nonicteric HENT:  [place]/AT, external ears normal.  Nares patent without epistasis.  Mucous membranes are moist Neck: Soft, normal range of motion, supple, no masses, no thyromegaly. Trachea midline Respiratory: clear to auscultation bilaterally, no wheezing, no crackles. Normal respiratory effort. No accessory muscle use.  Cardiovascular: Regular rate and rhythm, no murmurs / rubs / gallops. Has +1 edema of legs.  Abdomen: Soft, no tenderness, nondistended, no rebound  or guarding.  No masses palpated. Bowel sounds normoactive Musculoskeletal: FROM. no cyanosis. Normal muscle tone. Nail beds are pale.  Right medial foot amputation site healing without erythema or drainage Skin: Warm, dry, intact no rashes, lesions, ulcers. No induration Neurologic: CN 2-12 grossly intact.  Normal speech. Strength 5/5 in all extremities.   Psychiatric: Normal judgment and insight.  Normal mood.    Labs on Admission: I have personally reviewed following labs and imaging studies  CBC: Recent Labs  Lab 05/12/20 0041  WBC 8.7  NEUTROABS 7.2  HGB 3.7*  HCT 12.3*  MCV 93.9  PLT 147*    Basic Metabolic Panel: Recent Labs  Lab 05/12/20 0041  NA 135  K 3.9  CL 104  CO2 21*  GLUCOSE 270*  BUN 27*  CREATININE 1.78*  CALCIUM 7.9*    GFR: CrCl cannot be calculated (Unknown ideal weight.).  Liver Function Tests: Recent Labs  Lab 05/12/20 0041  AST 100*  ALT 85*  ALKPHOS 120  BILITOT 0.5  PROT 5.8*  ALBUMIN 1.6*    Urine analysis:    Component Value Date/Time   COLORURINE YELLOW 05/12/2020 0052   APPEARANCEUR CLEAR 05/12/2020 0052   LABSPEC 1.012 05/12/2020 0052   PHURINE 5.0 05/12/2020 0052   GLUCOSEU >=500 (A) 05/12/2020 0052   HGBUR SMALL (A) 05/12/2020 0052   BILIRUBINUR NEGATIVE 05/12/2020 0052   BILIRUBINUR negative 01/02/2015 [place] 05/12/2020 0052   PROTEINUR >=300 (A) 05/12/2020 0052   UROBILINOGEN 0.2 01/13/2015 0223   NITRITE NEGATIVE  05/12/2020 0052   LEUKOCYTESUR NEGATIVE 05/12/2020 0052    Radiological Exams on Admission: DG Chest Portable 1 View  Result Date: 05/12/2020 CLINICAL DATA:  PICC placement. EXAM: PORTABLE CHEST 1 VIEW COMPARISON:  04/25/2020. FINDINGS: Right upper extremity PICC tip at the atrial caval junction. Increased heart size from prior exam. Patchy heterogeneous bilateral lung opacities, greatest in the suprahilar region. Possible right pleural effusion. No pneumothorax. IMPRESSION: 1. Right upper extremity PICC tip at the atrial caval junction. 2. Patchy heterogeneous bilateral lung opacities which may represent multifocal pneumonia or pulmonary edema. Increased heart size over the past 2 weeks, consider echocardiogram to assess for pericardial effusion. Electronically Signed   By: Keith Rake M.D.   On: 05/12/2020 01:17    EKG: Independently reviewed.  EKG shows normal sinus rhythm with no acute ST elevation or depression.  QTc 422  Assessment/Plan Principal Problem:   Normocytic anemia Ms. Haberman is admitted to medical telemetry floor with profound anemia. Hgb is 3.7.  Patient has been typed and screened and 2 units of blood will be transfused.  We will recheck hemoglobin hematocrit after transfusion and anticipate further transfusions will be required. Patient has had a transfusion in the past and had a colonoscopy within the last 2 months that she states was normal.  Active Problems:   GI bleeding Protonix 40 mg IV daily. [facility] consulted by ER physician for further evaluation    Hypertension associated with diabetes  Home medications of coreg, hydralazine, imdur continued. Monitor BP.     Diabetes mellitus type 1 Continue basal insulin. SSI provided as needed for glycemic control.  Monitor blood sugars with meals and at bedtime. Patient hemoglobin A1c in January 2022 that was 8.6 and would not be repeated at this time    AKI (acute kidney injury)  Patient with AKI on labs.  She  reports diarrhea yesterday. IV fluid hydration with LR Recheck electrolytes and renal function in morning  Diarrhea Patient with profuse diarrhea she reports yesterday.  She states there was no blood in the diarrhea that she could see. The patient having diarrhea, AKI and has been on antibiotics recently we will check C. difficile and if positive will treat accordingly    DVT prophylaxis: SCDs for DVT prophylaxis Code Status:   Full Code  Family Communication:  No family at bedside. Disposition Plan:   Patient is from:  Home  Anticipated DC to:  Home  Anticipated DC date:  Anticipate 2 midnight or more hospitalization to treat acute condition  Anticipated DC barriers: No barriers to discharge identified at this time  Consults called:  GI-consulted by ER physician with secure chat sent to [facility] Admission status:  Inpatient  Yevonne Aline Persia Lintner MD [facility]  How to contact the [facility] Attending or Consulting provider [place] or covering provider during after hours [place], for this patient?   1. Check the care team in [facility] and look for a) attending/consulting [facility] provider listed and b) the [facility] team listed 2. Log into [email or web address] and use [place]'s universal password to access. If you do not have the password, please contact the hospital operator. 3. Locate the [facility] provider you are looking for under [facility] and page to a number that you can be directly reached. 4. If you still have difficulty reaching the provider, please page the [facility] (Director on Call) for the Hospitalists listed on amion for assistance.  05/12/2020, 2:17 AM

## 2020-05-13 ENCOUNTER — Other Ambulatory Visit (HOSPITAL_COMMUNITY): Payer: 59

## 2020-05-13 ENCOUNTER — Inpatient Hospital Stay (HOSPITAL_COMMUNITY): Payer: 59

## 2020-05-13 DIAGNOSIS — E1069 Type 1 diabetes mellitus with other specified complication: Secondary | ICD-10-CM | POA: Diagnosis not present

## 2020-05-13 DIAGNOSIS — J189 Pneumonia, unspecified organism: Secondary | ICD-10-CM

## 2020-05-13 DIAGNOSIS — Z9989 Dependence on other enabling machines and devices: Secondary | ICD-10-CM

## 2020-05-13 DIAGNOSIS — J9601 Acute respiratory failure with hypoxia: Secondary | ICD-10-CM | POA: Diagnosis not present

## 2020-05-13 DIAGNOSIS — Z89421 Acquired absence of other right toe(s): Secondary | ICD-10-CM

## 2020-05-13 DIAGNOSIS — M86171 Other acute osteomyelitis, right ankle and foot: Secondary | ICD-10-CM | POA: Diagnosis not present

## 2020-05-13 DIAGNOSIS — N179 Acute kidney failure, unspecified: Secondary | ICD-10-CM | POA: Diagnosis not present

## 2020-05-13 DIAGNOSIS — D649 Anemia, unspecified: Secondary | ICD-10-CM | POA: Diagnosis not present

## 2020-05-13 DIAGNOSIS — G9341 Metabolic encephalopathy: Secondary | ICD-10-CM

## 2020-05-13 DIAGNOSIS — Z8616 Personal history of COVID-19: Secondary | ICD-10-CM

## 2020-05-13 DIAGNOSIS — J80 Acute respiratory distress syndrome: Principal | ICD-10-CM

## 2020-05-13 LAB — CBC WITH DIFFERENTIAL/PLATELET
Abs Immature Granulocytes: 0.02 10*3/uL (ref 0.00–0.07)
Basophils Absolute: 0 10*3/uL (ref 0.0–0.1)
Basophils Relative: 1 %
Eosinophils Absolute: 0 10*3/uL (ref 0.0–0.5)
Eosinophils Relative: 0 %
HCT: 28.4 % — ABNORMAL LOW (ref 36.0–46.0)
Hemoglobin: 9.6 g/dL — ABNORMAL LOW (ref 12.0–15.0)
Immature Granulocytes: 0 %
Lymphocytes Relative: 10 %
Lymphs Abs: 0.7 10*3/uL (ref 0.7–4.0)
MCH: 29.8 pg (ref 26.0–34.0)
MCHC: 33.8 g/dL (ref 30.0–36.0)
MCV: 88.2 fL (ref 80.0–100.0)
Monocytes Absolute: 0.2 10*3/uL (ref 0.1–1.0)
Monocytes Relative: 3 %
Neutro Abs: 6 10*3/uL (ref 1.7–7.7)
Neutrophils Relative %: 86 %
Platelets: 151 10*3/uL (ref 150–400)
RBC: 3.22 MIL/uL — ABNORMAL LOW (ref 3.87–5.11)
RDW: 14.6 % (ref 11.5–15.5)
WBC Morphology: INCREASED
WBC: 7 10*3/uL (ref 4.0–10.5)
nRBC: 0 % (ref 0.0–0.2)

## 2020-05-13 LAB — COMPREHENSIVE METABOLIC PANEL
ALT: 103 U/L — ABNORMAL HIGH (ref 0–44)
AST: 162 U/L — ABNORMAL HIGH (ref 15–41)
Albumin: 1.4 g/dL — ABNORMAL LOW (ref 3.5–5.0)
Alkaline Phosphatase: 192 U/L — ABNORMAL HIGH (ref 38–126)
Anion gap: 12 (ref 5–15)
BUN: 27 mg/dL — ABNORMAL HIGH (ref 6–20)
CO2: 24 mmol/L (ref 22–32)
Calcium: 8 mg/dL — ABNORMAL LOW (ref 8.9–10.3)
Chloride: 101 mmol/L (ref 98–111)
Creatinine, Ser: 1.9 mg/dL — ABNORMAL HIGH (ref 0.44–1.00)
GFR, Estimated: 36 mL/min — ABNORMAL LOW (ref >60)
Glucose, Bld: 158 mg/dL — ABNORMAL HIGH (ref 70–99)
Potassium: 3.8 mmol/L (ref 3.5–5.1)
Sodium: 137 mmol/L (ref 135–145)
Total Bilirubin: 2.1 mg/dL — ABNORMAL HIGH (ref 0.3–1.2)
Total Protein: 5.9 g/dL — ABNORMAL LOW (ref 6.5–8.1)

## 2020-05-13 LAB — TYPE AND SCREEN
ABO/RH(D): O POS
Antibody Screen: NEGATIVE
Unit division: 0
Unit division: 0

## 2020-05-13 LAB — BLOOD GAS, ARTERIAL
Acid-base deficit: 1.9 mmol/L (ref 0.0–2.0)
Bicarbonate: 23.1 mmol/L (ref 20.0–28.0)
Drawn by: 31996
FIO2: 100
O2 Saturation: 88.6 %
Patient temperature: 36.5
pCO2 arterial: 43.3 mmHg (ref 32.0–48.0)
pH, Arterial: 7.344 — ABNORMAL LOW (ref 7.350–7.450)
pO2, Arterial: 56.5 mmHg — ABNORMAL LOW (ref 83.0–108.0)

## 2020-05-13 LAB — RESPIRATORY PANEL BY PCR

## 2020-05-13 LAB — BODY FLUID CELL COUNT WITH DIFFERENTIAL
Eos, Fluid: 3 %
Lymphs, Fluid: 3 %
Monocyte-Macrophage-Serous Fluid: 10 % — ABNORMAL LOW (ref 50–90)
Neutrophil Count, Fluid: 84 % — ABNORMAL HIGH (ref 0–25)
Total Nucleated Cell Count, Fluid: 980 cu mm (ref 0–1000)

## 2020-05-13 LAB — POCT I-STAT 7, (LYTES, BLD GAS, ICA,H+H)
Acid-Base Excess: 3 mmol/L — ABNORMAL HIGH (ref 0.0–2.0)
Bicarbonate: 28.1 mmol/L — ABNORMAL HIGH (ref 20.0–28.0)
Calcium, Ion: 1.16 mmol/L (ref 1.15–1.40)
HCT: 28 % — ABNORMAL LOW (ref 36.0–46.0)
Hemoglobin: 9.5 g/dL — ABNORMAL LOW (ref 12.0–15.0)
O2 Saturation: 99 %
Patient temperature: 99.6
Potassium: 3.9 mmol/L (ref 3.5–5.1)
Sodium: 137 mmol/L (ref 135–145)
TCO2: 29 mmol/L (ref 22–32)
pCO2 arterial: 43.7 mmHg (ref 32.0–48.0)
pH, Arterial: 7.418 (ref 7.350–7.450)
pO2, Arterial: 147 mmHg — ABNORMAL HIGH (ref 83.0–108.0)

## 2020-05-13 LAB — BPAM RBC
Blood Product Expiration Date: 202203102359
Blood Product Expiration Date: 202203142359
ISSUE DATE / TIME: 202202120316
ISSUE DATE / TIME: 202202120601
Unit Type and Rh: 5100
Unit Type and Rh: 5100

## 2020-05-13 LAB — GLUCOSE, CAPILLARY
Glucose-Capillary: 148 mg/dL — ABNORMAL HIGH (ref 70–99)
Glucose-Capillary: 136 mg/dL — ABNORMAL HIGH (ref 70–99)
Glucose-Capillary: 144 mg/dL — ABNORMAL HIGH (ref 70–99)
Glucose-Capillary: 107 mg/dL — ABNORMAL HIGH (ref 70–99)

## 2020-05-13 LAB — ECHOCARDIOGRAM COMPLETE
Area-P 1/2: 5.27 cm2
Height: 67 in
S' Lateral: 2.7 cm
Weight: 2504.43 oz

## 2020-05-13 LAB — SEDIMENTATION RATE: Sed Rate: 127 mm/hr — ABNORMAL HIGH (ref 0–22)

## 2020-05-13 LAB — C DIFFICILE QUICK SCREEN W PCR REFLEX
C Diff antigen: NEGATIVE
C Diff interpretation: NOT DETECTED
C Diff toxin: NEGATIVE

## 2020-05-13 LAB — MAGNESIUM: Magnesium: 1.5 mg/dL — ABNORMAL LOW (ref 1.7–2.4)

## 2020-05-13 LAB — D-DIMER, QUANTITATIVE: D-Dimer, Quant: 15.09 ug/mL-FEU — ABNORMAL HIGH (ref 0.00–0.50)

## 2020-05-13 LAB — FERRITIN: Ferritin: 1089 ng/mL — ABNORMAL HIGH (ref 11–307)

## 2020-05-13 LAB — C-REACTIVE PROTEIN: CRP: 30.1 mg/dL — ABNORMAL HIGH (ref <1.0)

## 2020-05-13 LAB — PROCALCITONIN: Procalcitonin: 10.74 ng/mL

## 2020-05-13 LAB — STREP PNEUMONIAE URINARY ANTIGEN: Strep Pneumo Urinary Antigen: NEGATIVE

## 2020-05-13 IMAGING — DX DG CHEST 1V PORT
1 series · 1 of 1 positions shown · non-contrast
Comparison: [DATE]

CLINICAL DATA: Abnormal chest x-ray

EXAM:
PORTABLE CHEST 1 VIEW

[chest ap]
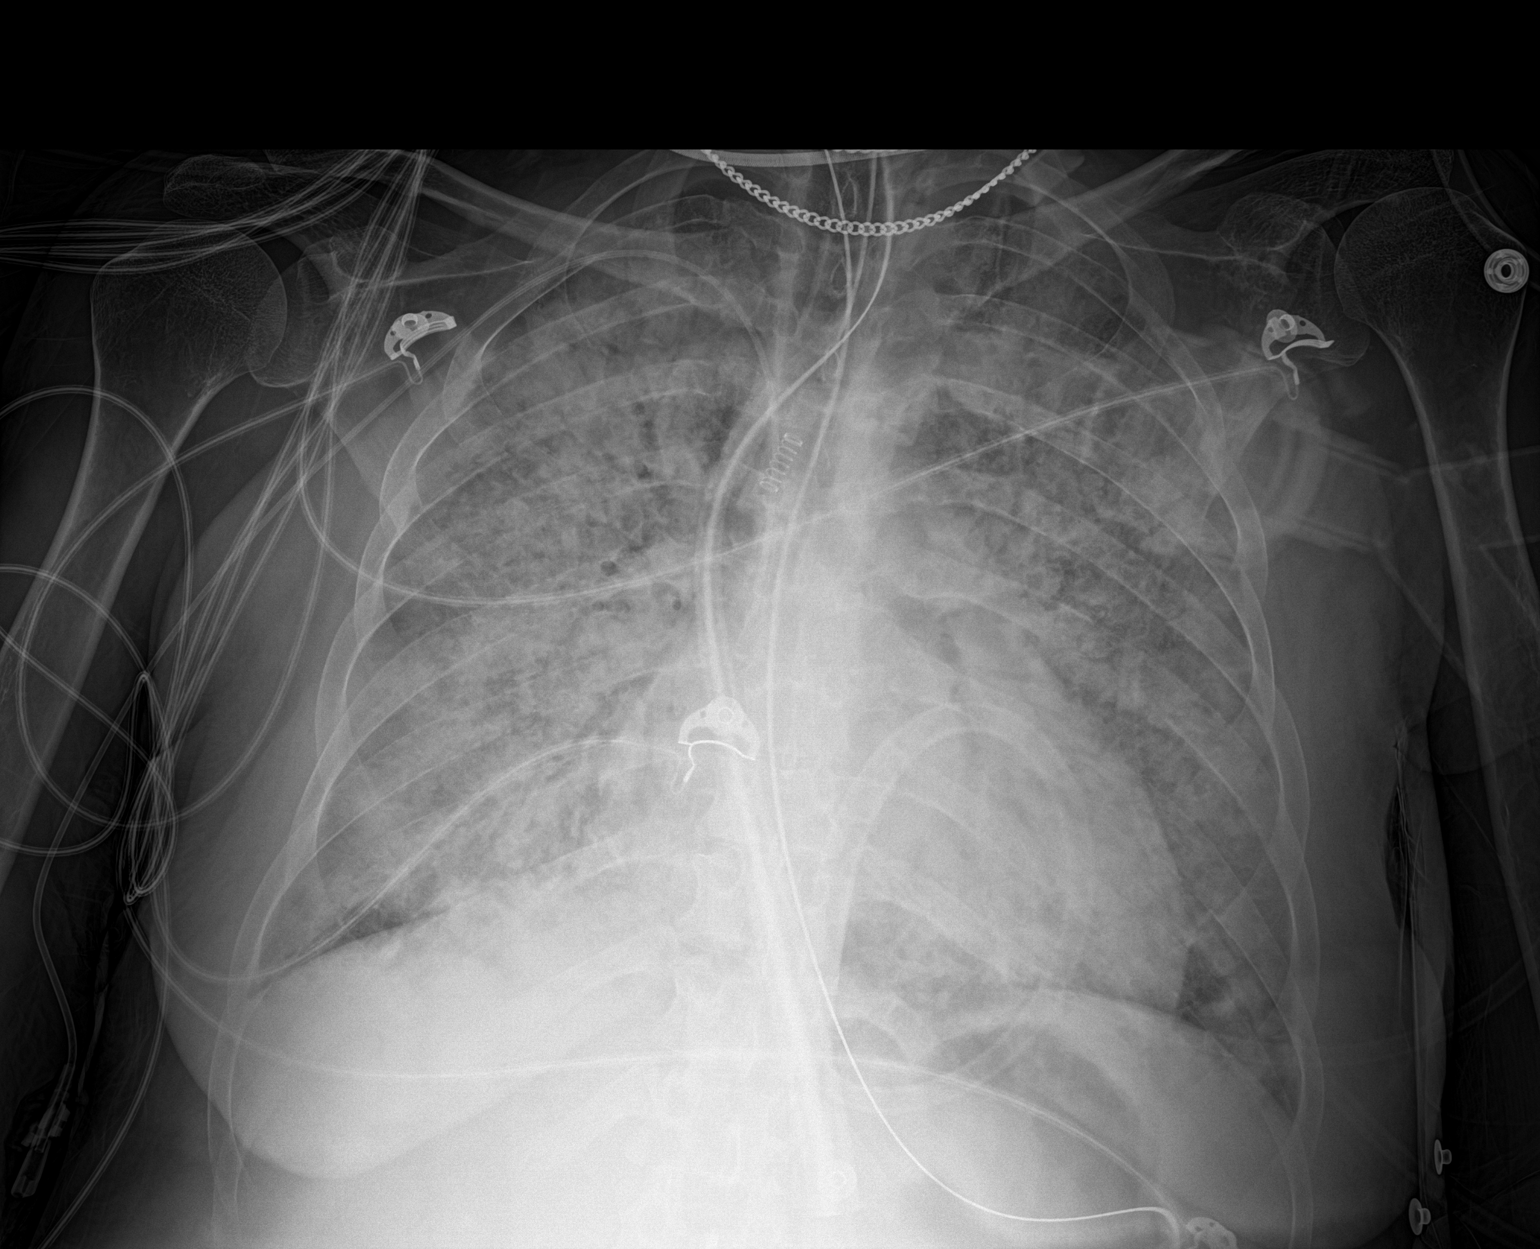

[1 of 1 positions shown; findings below may reference images not displayed]

FINDINGS: The enteric tube extends below the left hemidiaphragm. The
right-sided PICC line is well position. The endotracheal tube
terminates above the carina. There is no pneumothorax. Diffuse hazy
bilateral airspace opacities are again noted and have slightly
worsened since the prior study.
IMPRESSION: 1. Slight interval worsening of the bilateral hazy airspace
opacities.
2. Support lines and tubes as above.

## 2020-05-13 IMAGING — DX DG CHEST 1V PORT
1 series · 1 of 1 positions shown · non-contrast
Comparison: Chest x-ray from yesterday.

CLINICAL DATA: Hypoxia.

EXAM:
PORTABLE CHEST 1 VIEW

[chest ap]
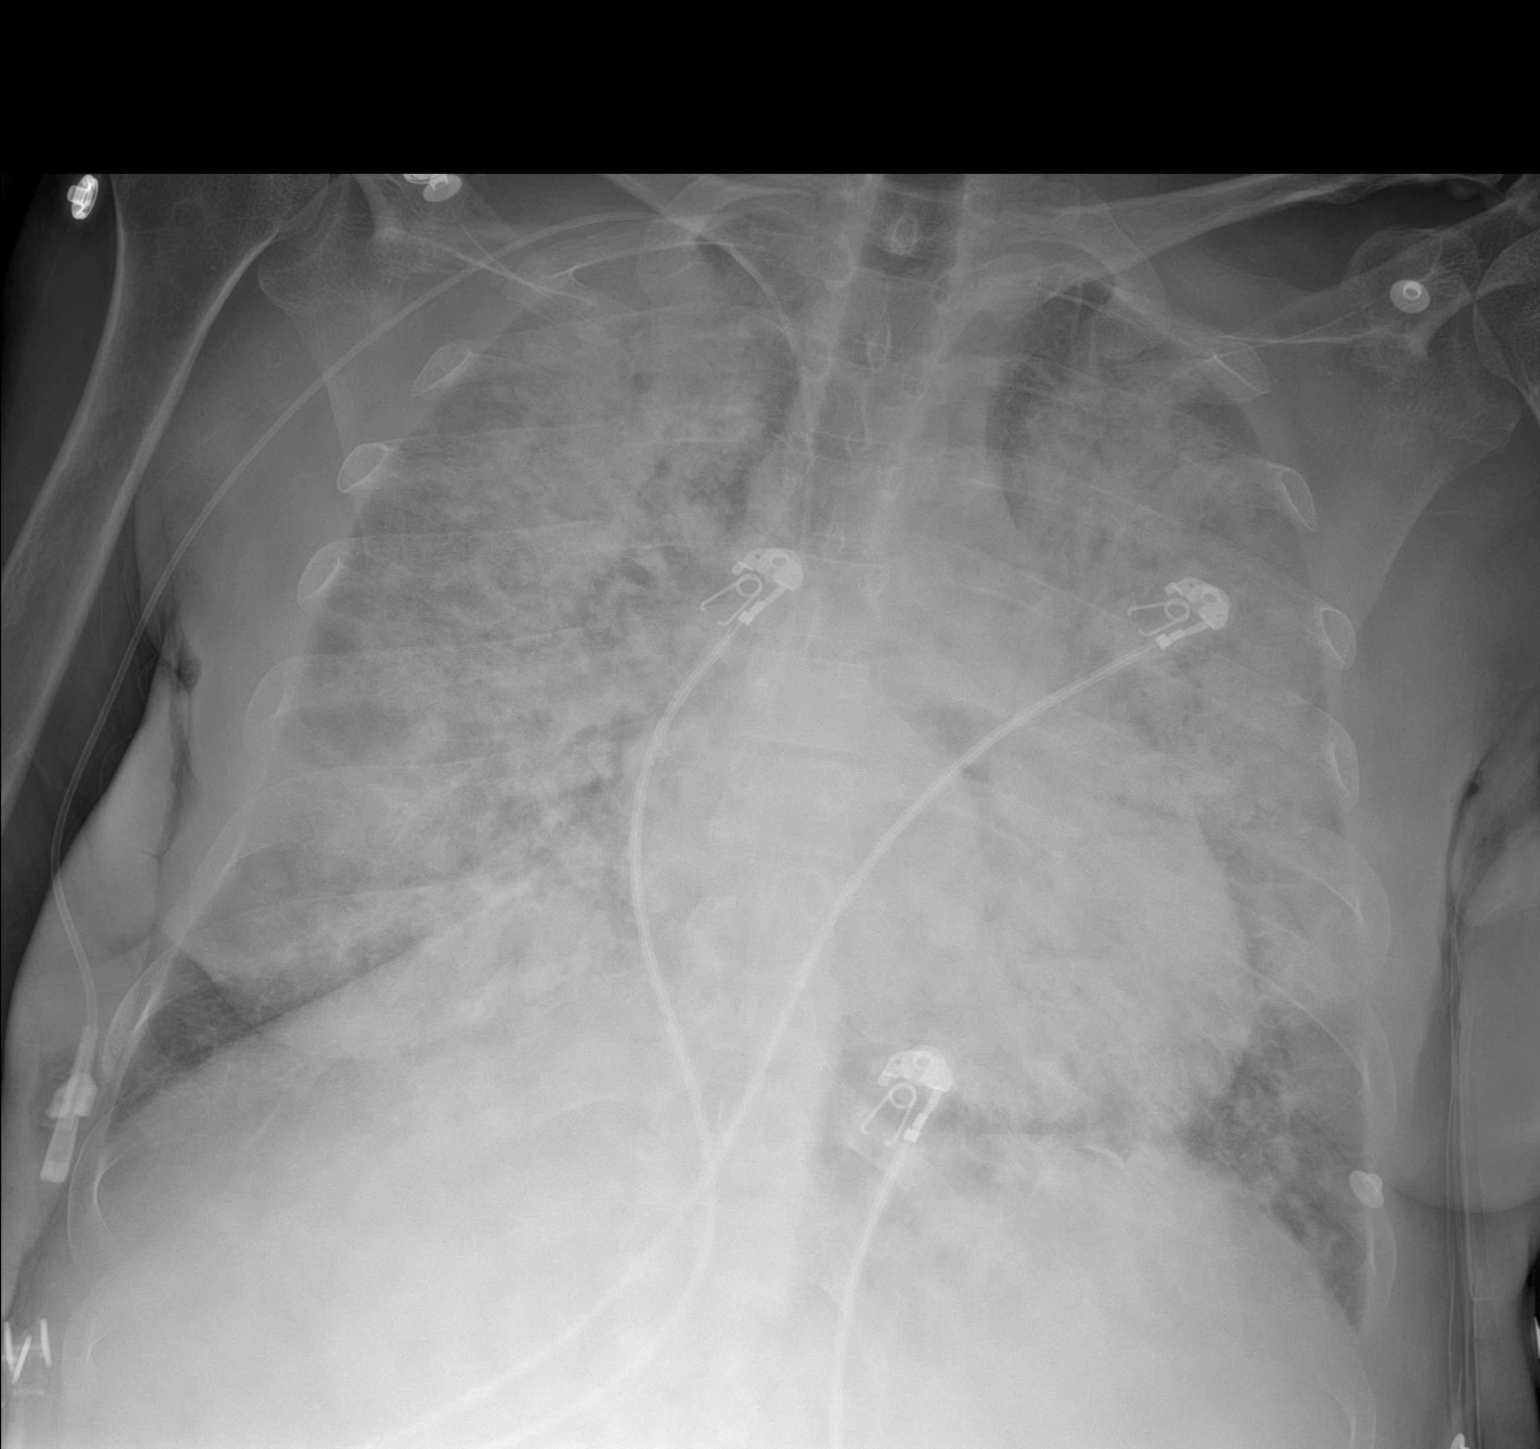

[1 of 1 positions shown; findings below may reference images not displayed]

FINDINGS: Unchanged right upper extremity PICC line with the tip at the
cavoatrial junction. Stable cardiomediastinal silhouette, enlarged
compared to 3 weeks ago. Significantly worsened diffuse bilateral
airspace disease, more confluent in the interval, with relative
sparing of the bases. No pneumothorax or pleural effusion. No acute
osseous abnormality.
IMPRESSION: 1. Significantly worsened diffuse bilateral airspace disease may
reflect pneumonia, edema, or ARDS.
2. Unchanged borderline cardiomegaly.

## 2020-05-13 IMAGING — DX DG ABDOMEN 1V
2 series · 2 of 2 positions shown · non-contrast
Comparison: [DATE]

CLINICAL DATA: Abnormal chest x-ray.  Line placement.

EXAM:
ABDOMEN - 1 VIEW

[abdomen kub (1 of 2)]
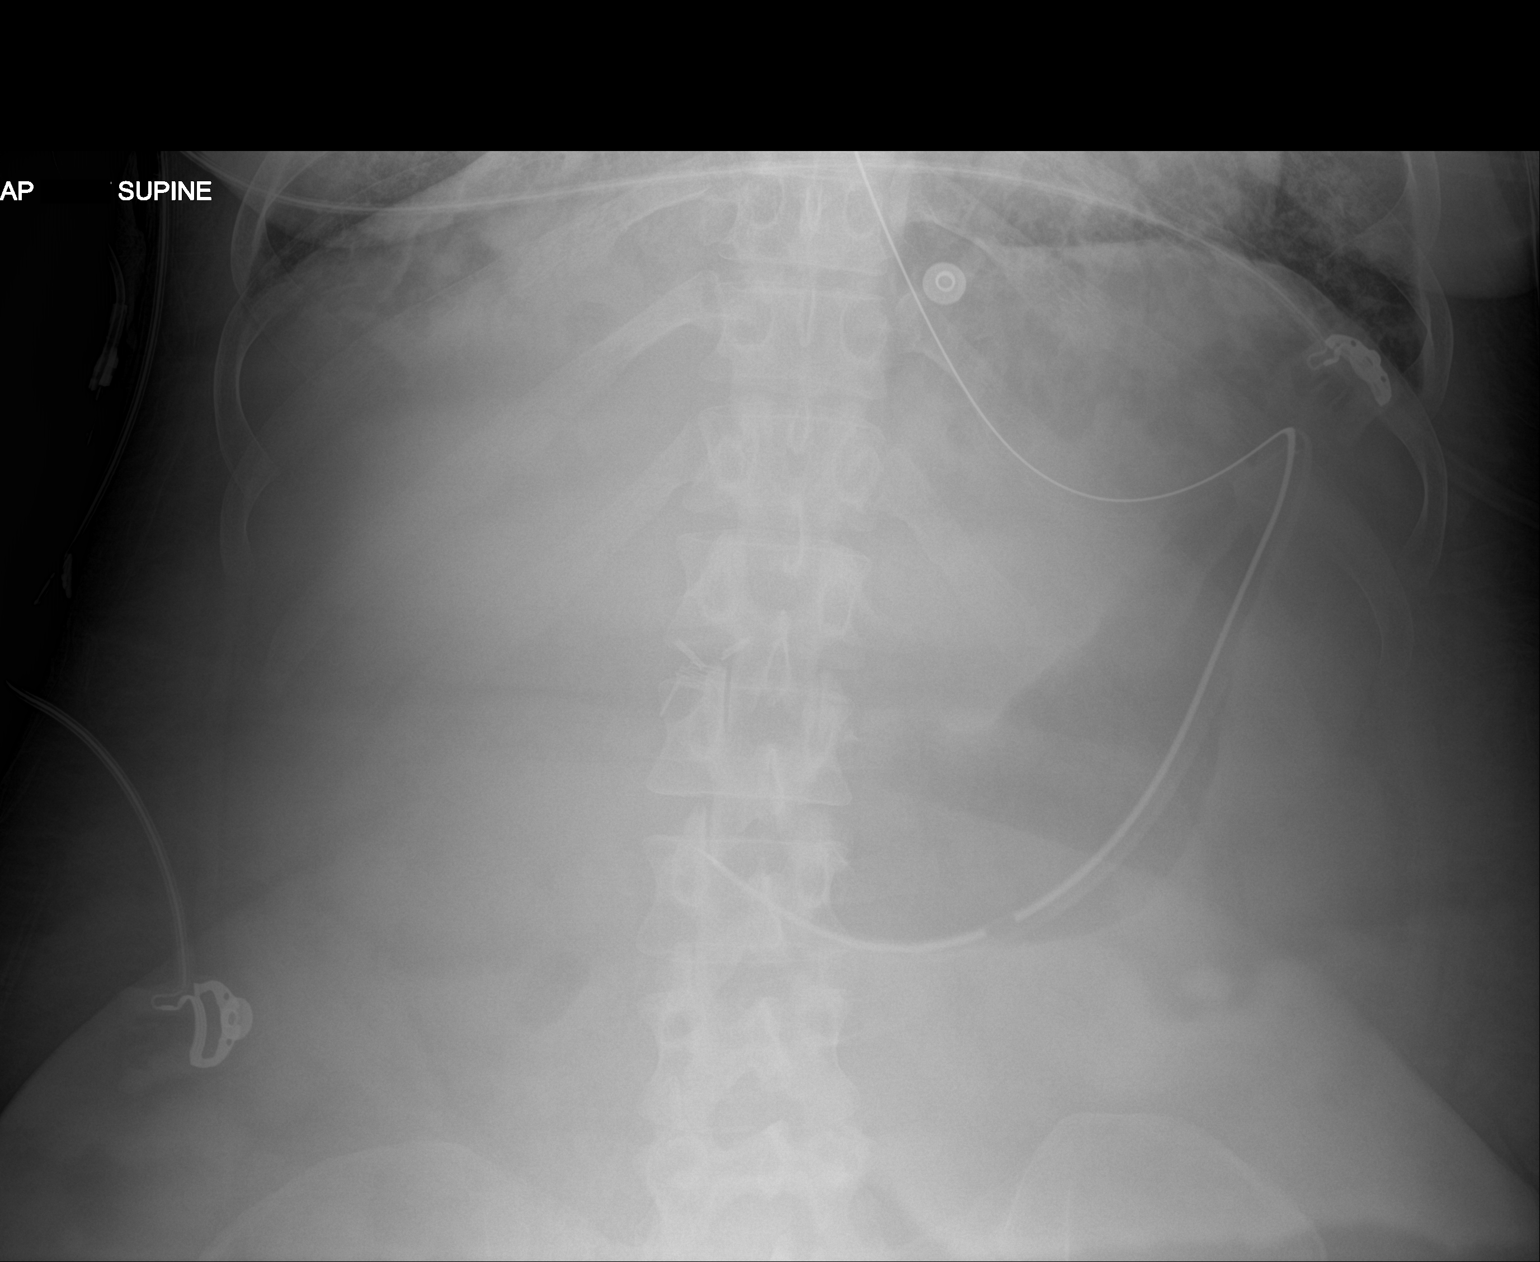

[abdomen kub (2 of 2)]
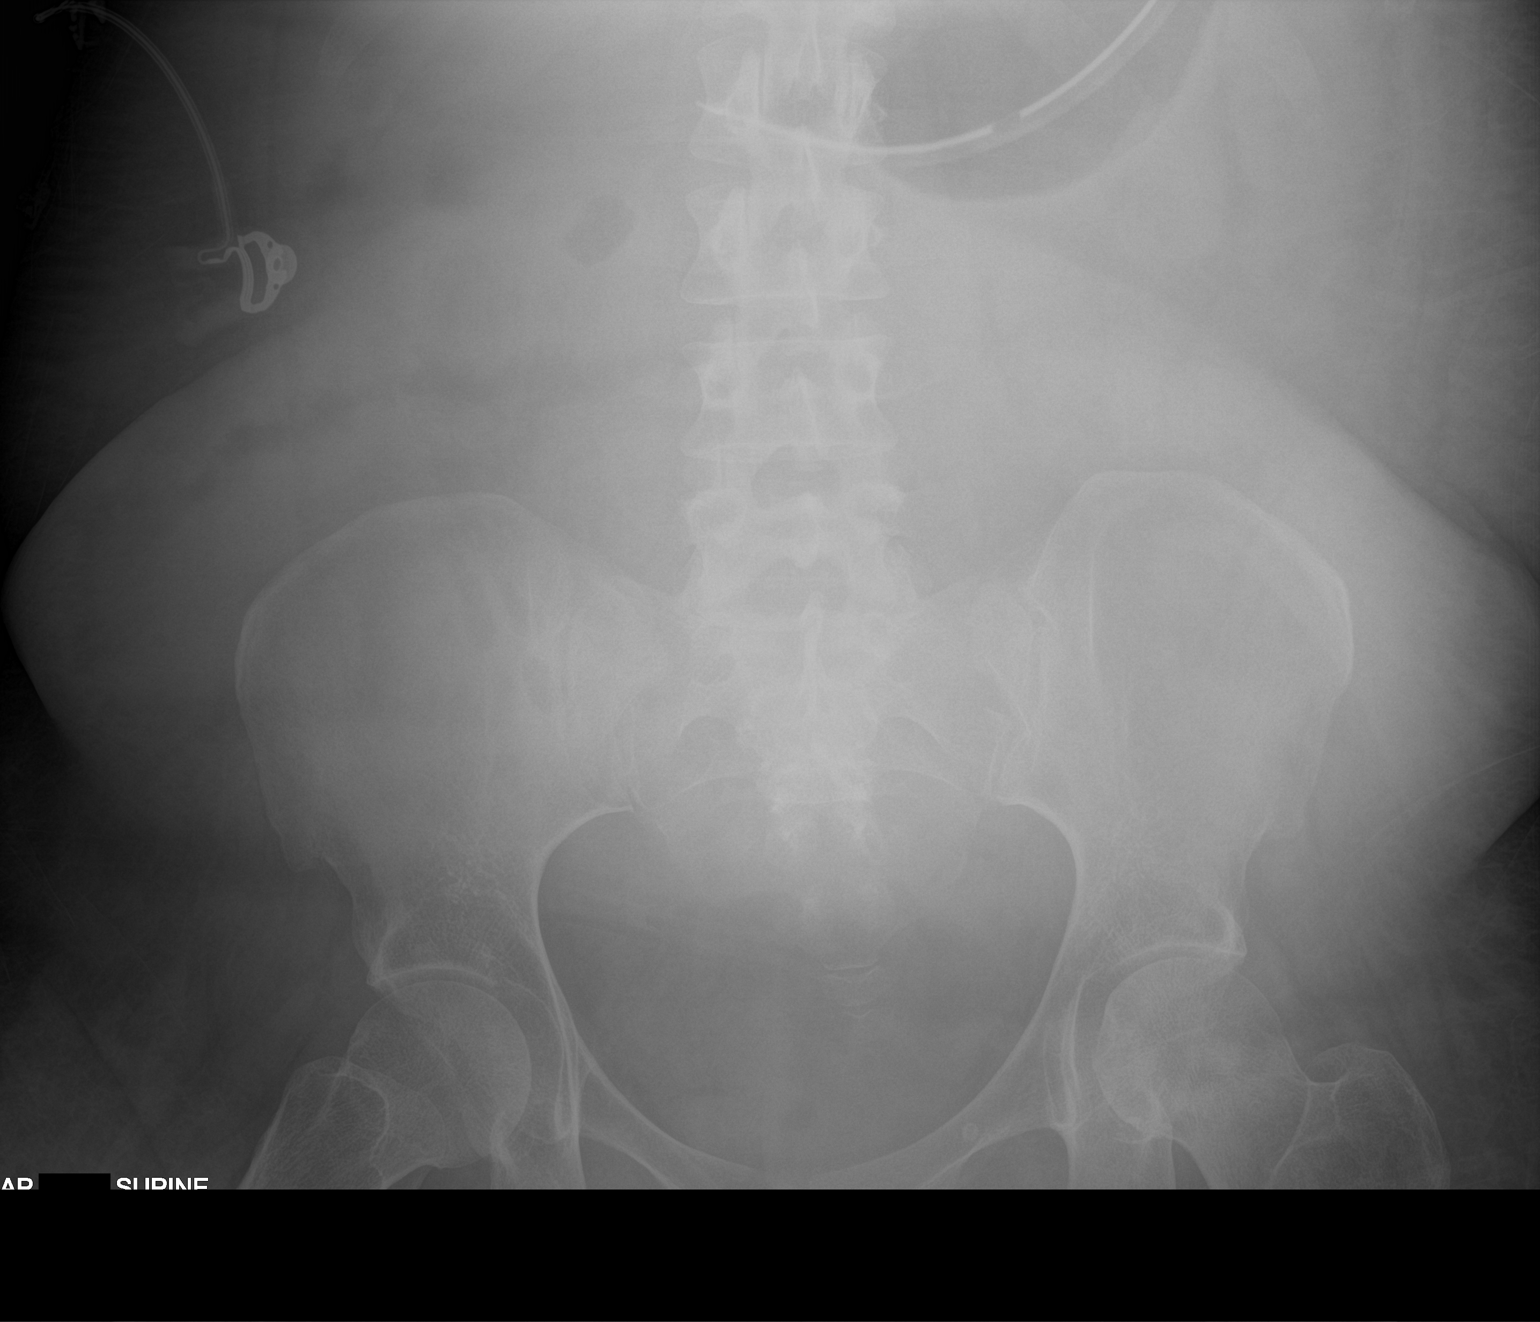

[2 of 2 positions shown; findings below may reference images not displayed]

FINDINGS: The enteric tube projects over the gastric body. The bowel gas
pattern is nonspecific with a relative paucity of bowel gas in the
abdomen.
IMPRESSION: The enteric tube projects over the gastric body.

## 2020-05-13 MED ORDER — PROPOFOL 1000 MG/100ML IV EMUL
0.0000 ug/kg/min | INTRAVENOUS | Status: DC
  Administered 2020-05-13: 5 ug/kg/min via INTRAVENOUS
  Administered 2020-05-14 (×2): 20 ug/kg/min via INTRAVENOUS
  Administered 2020-05-14: 30 ug/kg/min via INTRAVENOUS
  Administered 2020-05-15: 20 ug/kg/min via INTRAVENOUS
  Filled 2020-05-13 (×5): qty 100

## 2020-05-13 MED ORDER — ETOMIDATE 2 MG/ML IV SOLN
20.0000 mg | Freq: Once | INTRAVENOUS | Status: AC
  Administered 2020-05-13: 20 mg via INTRAVENOUS
  Filled 2020-05-13: qty 10

## 2020-05-13 MED ORDER — FENTANYL BOLUS VIA INFUSION
50.0000 ug | INTRAVENOUS | Status: DC | PRN
  Administered 2020-05-13: 50 ug via INTRAVENOUS
  Filled 2020-05-13: qty 50

## 2020-05-13 MED ORDER — MAGNESIUM SULFATE 2 GM/50ML IV SOLN
2.0000 g | Freq: Once | INTRAVENOUS | Status: AC
  Administered 2020-05-13: 2 g via INTRAVENOUS
  Filled 2020-05-13: qty 50

## 2020-05-13 MED ORDER — SODIUM CHLORIDE 0.9% FLUSH: 10.0000 mL | INTRAVENOUS | Status: DC | PRN

## 2020-05-13 MED ORDER — ROCURONIUM BROMIDE 50 MG/5ML IV SOLN
1.0000 mg/kg | Freq: Once | INTRAVENOUS | Status: DC
  Filled 2020-05-13: qty 7.1

## 2020-05-13 MED ORDER — FENTANYL CITRATE (PF) 100 MCG/2ML IJ SOLN
12.5000 ug | Freq: Once | INTRAMUSCULAR | Status: AC
  Administered 2020-05-13: 12.5 ug via INTRAVENOUS
  Filled 2020-05-13: qty 2

## 2020-05-13 MED ORDER — ROCURONIUM BROMIDE 10 MG/ML (PF) SYRINGE
PREFILLED_SYRINGE | INTRAVENOUS | Status: AC
  Administered 2020-05-13: 80 mg
  Filled 2020-05-13: qty 10

## 2020-05-13 MED ORDER — INSULIN ASPART 100 UNIT/ML ~~LOC~~ SOLN
0.0000 [IU] | SUBCUTANEOUS | Status: DC | PRN
  Administered 2020-05-15: 3 [IU] via SUBCUTANEOUS

## 2020-05-13 MED ORDER — DOCUSATE SODIUM 50 MG/5ML PO LIQD
100.0000 mg | Freq: Two times a day (BID) | ORAL | Status: DC
  Administered 2020-05-13: 100 mg
  Filled 2020-05-13 (×2): qty 10

## 2020-05-13 MED ORDER — ORAL CARE MOUTH RINSE
15.0000 mL | OROMUCOSAL | Status: DC
  Administered 2020-05-13 – 2020-05-19 (×57): 15 mL via OROMUCOSAL

## 2020-05-13 MED ORDER — FENTANYL CITRATE (PF) 100 MCG/2ML IJ SOLN
50.0000 ug | Freq: Once | INTRAMUSCULAR | Status: AC
  Administered 2020-05-13: 50 ug via INTRAVENOUS

## 2020-05-13 MED ORDER — LINEZOLID 600 MG/300ML IV SOLN
600.0000 mg | Freq: Two times a day (BID) | INTRAVENOUS | Status: DC
  Administered 2020-05-13 – 2020-05-18 (×12): 600 mg via INTRAVENOUS
  Filled 2020-05-13 (×14): qty 300

## 2020-05-13 MED ORDER — ACETAMINOPHEN 500 MG PO TABS: 500.0000 mg | ORAL_TABLET | Freq: Four times a day (QID) | ORAL | Status: DC | PRN

## 2020-05-13 MED ORDER — CHLORHEXIDINE GLUCONATE 0.12% ORAL RINSE (MEDLINE KIT)
15.0000 mL | Freq: Two times a day (BID) | OROMUCOSAL | Status: DC
  Administered 2020-05-13 – 2020-05-19 (×12): 15 mL via OROMUCOSAL

## 2020-05-13 MED ORDER — MIDAZOLAM HCL 2 MG/2ML IJ SOLN
4.0000 mg | Freq: Once | INTRAMUSCULAR | Status: AC
  Administered 2020-05-13: 4 mg via INTRAVENOUS
  Filled 2020-05-13: qty 4

## 2020-05-13 MED ORDER — POLYETHYLENE GLYCOL 3350 17 G PO PACK
17.0000 g | PACK | Freq: Every day | ORAL | Status: DC
  Administered 2020-05-13: 17 g
  Filled 2020-05-13: qty 1

## 2020-05-13 MED ORDER — FENTANYL 2500MCG IN NS 250ML (10MCG/ML) PREMIX INFUSION
50.0000 ug/h | INTRAVENOUS | Status: DC
  Administered 2020-05-13: 50 ug/h via INTRAVENOUS
  Administered 2020-05-14 (×2): 125 ug/h via INTRAVENOUS
  Filled 2020-05-13 (×4): qty 250

## 2020-05-13 MED ORDER — FENTANYL CITRATE (PF) 100 MCG/2ML IJ SOLN
100.0000 ug | Freq: Once | INTRAMUSCULAR | Status: AC
  Administered 2020-05-13: 100 ug via INTRAVENOUS
  Filled 2020-05-13: qty 2

## 2020-05-13 MED ORDER — SODIUM CHLORIDE 0.9% FLUSH
10.0000 mL | Freq: Two times a day (BID) | INTRAVENOUS | Status: DC
  Administered 2020-05-14 – 2020-05-24 (×18): 10 mL

## 2020-05-13 MED ORDER — FUROSEMIDE 10 MG/ML IJ SOLN
60.0000 mg | Freq: Once | INTRAMUSCULAR | Status: AC
  Administered 2020-05-13: 60 mg via INTRAVENOUS
  Filled 2020-05-13: qty 6

## 2020-05-13 MED ORDER — POTASSIUM CHLORIDE 10 MEQ/100ML IV SOLN
10.0000 meq | INTRAVENOUS | Status: AC
  Administered 2020-05-13 (×2): 10 meq via INTRAVENOUS
  Filled 2020-05-13 (×2): qty 100

## 2020-05-13 NOTE — Plan of Care (Signed)
Vent 20/450/14/90% with Pplat 30, DP 16  Steffanie Dunn, DO 05/13/20 6:34 PM [facility]

## 2020-05-13 NOTE — Progress Notes (Signed)
[facility] Progress Note Patient Name: [name] DOB: [birth year] MRN: [id number]   Date of Service  05/13/2020  HPI/Events of Note  RN has several quetions: 1) Abg is not condusive to proning, the results are back. 2) Do you want her to have OG to LIS or clamp when she is not giving meds, need order?  3) Can you change cbg checks to q 4, now achs and she is on the vent?   Discussed with bed side RN: P/F > 150, improving. In synchrony.  On camera eval.   eICU Interventions  - wean Vt/fio2 as tolerated. Now VT < 8 ml/ibw. Goal to wean down further. - ACHS changed.  KUB: NG in place, Ok to use for meds and ok to clamp. - no prone for now. Watch.       Intervention Category Intermediate Interventions: Diagnostic test evaluation;Hyperglycemia - evaluation and treatment  [facility] Sow 05/13/2020, 8:33 PM

## 2020-05-13 NOTE — Progress Notes (Signed)
An Arterial line was attempted by dayshift and night shift therapists but each attempt was unsuccessful. MD is aware.

## 2020-05-13 NOTE — Progress Notes (Signed)
ET tube advanced 2cm per Dr. Chestine Spore. ET tape securely at 26 at the lip.

## 2020-05-13 NOTE — Progress Notes (Signed)
  Echocardiogram 2D Echocardiogram has been performed.  [name] 05/13/2020, 11:55 AM

## 2020-05-13 NOTE — Procedures (Signed)
Intubation Procedure Note  [name]  [id number]  20-Apr-1990  Date:05/13/20  Time:6:03 PM   Provider Performing:Lashanda Storlie P [name]    Procedure: Intubation (70488)  Indication(s) Respiratory Failure  Consent Risks of the procedure as well as the alternatives and risks of each were explained to the patient and/or caregiver.  Consent for the procedure was obtained and is signed in the bedside chart   Anesthesia Etomidate and Rocuronium   Time Out Verified patient identification, verified procedure, site/side was marked, verified correct patient position, special equipment/implants available, medications/allergies/relevant history reviewed, required imaging and test results available.   Sterile Technique Usual hand hygeine, masks, and gloves were used   Procedure Description Patient positioned in bed supine.  Sedation given as noted above.  Patient was intubated with 8.0 endotracheal tube using Glidescope.  View was Grade 1 full glottis .  Number of attempts was 1.  Colorimetric CO2 detector was consistent with tracheal placement.   Complications/Tolerance desaturation Chest X-ray is ordered to verify placement.   EBL Minimal   Specimen(s) None  Kathryn Hy, DO 05/13/20 6:04 PM [facility]  From 7AM- 7PM if no response to pager, please call [phone]. After hours, 7PM- 7AM, please call [facility]  [phone].

## 2020-05-13 NOTE — Consult Note (Signed)
NAME:  [name], MRN:  [id number], DOB:  06/30/1990, LOS: 1 ADMISSION DATE:  05/12/2020, CONSULTATION DATE: 05/13/2020 REFERRING MD: [facility] CHIEF COMPLAINT: Hypoxia  Brief History:  30 year old type I diabetic recent foot surgery suspected aspiration.  History of Present Illness:  30 year old type I diabetic is had Covid 3 times in the last year he had recent orthopedic surgery on her right foot per Dr. [name] and went to [facility] with pain in right foot where she underwent I&D and VAC placemt.Marland Kitchen She was transferred to [facility] emergency department where she spent the night.  On 05/13/2020 she had increasing FiO2 demands currently is 100% FiO2 on BiPAP with sats of 86% and a PO2 of 56.  She remains alert follows commands able to interact well.  X-ray shows extensive bilateral airspace disease questionable this is a component of her recent Covid infections.  Due to the fact she is on 100% FiO2 and is not saturating properly should be transferred to the intensive care unit where she may need to be intubated.  She also has elevated procalcitonin she reports nausea vomiting questionable aspiration event 05/12/2020 is on broad-spectrum antibiotics including Unasyn and daptomycin.  If intubated sputum culture will be obtained at that time.  Past Medical History:   Past Medical History:  Diagnosis Date  . Allergy   . Anemia   . Anxiety   . Blood transfusion without reported diagnosis    Dec 2018  . Cataract    right eye  . COVID-19 03/2020  . Depression   . Diabetes type 1, uncontrolled ([place]) 11/14/2011   Since age 29  . Fibromyalgia   . Gastroparesis   . GERD (gastroesophageal reflux disease)   . Hypertension   . Infection    UTI April 2016     Significant Hospital Events:  05/13/2020 transferred to intensive care unit  Consults:  05/13/2020 critical care  Procedures:    Significant Diagnostic Tests:    Micro Data:  05/12/2020 blood cultures x2 05/12/2020  Covid negative  Antimicrobials:  05/12/2020 daptomycin 05/12/2020 Unasyn  Interim History / Subjective:  Currently on BiPAP 100% with O2 saturations 86%  Objective   Blood pressure (!) 145/89, pulse 92, temperature 97.8 F (36.6 C), temperature source Axillary, resp. rate 18, height 5' 7"  (1.702 m), weight 71 kg, SpO2 100 %.    Vent Mode: PCV;BIPAP FiO2 (%):  [70 %-100 %] 100 % Set Rate:  [15 bmp] 15 bmp PEEP:  [8 cmH20] 8 cmH20   Intake/Output Summary (Last 24 hours) at 05/13/2020 0946 Last data filed at 05/13/2020 6568 Gross per 24 hour  Intake 725.69 ml  Output 800 ml  Net -74.31 ml   Filed Weights   05/12/20 0918 05/12/20 1035  Weight: 71.2 kg 71 kg    Examination: General: Young female BiPAP is able to communicate well HENT: No JVD or lymphadenopathy is appreciated Lungs: Decreased breath sounds throughout Cardiovascular: Heart sounds are regular Abdomen: Soft nontender positive bowel sounds Extremities: Right foot dressing    Neuro: Grossly intact without focal defect   Resolved Hospital Problem list     Assessment & Plan:  Acute hypoxic respiratory failure in the setting of recent nausea vomiting suspected aspiration and also recent history of 3 Covid positive occurrences,1/6/ 2022, 09/24/2019, 05/21/2019 but negative on 05/12/2020.  Chest x-ray with bilateral airspace disease requiring 100% FiO2 Transferred to the intensive care unit She may need intubation Broad-spectrum antibiotics for suspected aspiration Pulmonary toilet There  may be a component of ARDS from multiple Covid infections.  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 [name] Subsequent I&D at [facility] with wound [facility] application Orthopedics is following Currently on Unasyn started 05/12/2020 in daptomycin started on 02/2021 Monitor culture data  Presumed sepsis with procalcitonin 1074 white count of  7.0 Broad-spectrum antibiotic Monitor blood pressure intensive care Fluid resuscitation as needed   renal insufficiency Lab Results  Component Value Date   CREATININE 1.90 (H) 05/13/2020   CREATININE 1.90 (H) 05/12/2020   CREATININE 1.78 (H) 05/12/2020   CREATININE 0.48 (L) 11/07/2014   CREATININE 0.47 (L) 08/10/2014   CREATININE 0.68 04/15/2012   Fluid resuscitate Nephrotoxic Serial creatinine  Best practice (evaluated daily)  DieNPO Pain/Anxiety/Delirium protocol (if indicated): na VAP protocol (if indicated): na DVT prophylaxis: pas GI prophylaxis: ppi Glucose control: ssi Mobility: bedrest Disposition:tx to icu  Goals of Care:  Last date of multidisciplinary goals of care discussion: Family and staff present:  Summary of discussion:  Follow up goals of care discussion due:  Code Status: full  Labs   CBC: Recent Labs  Lab 05/12/20 0041 05/12/20 0935 05/12/20 1904 05/13/20 0530  WBC 8.7 6.2  --  7.0  NEUTROABS 7.2  --   --  6.0  HGB 3.7* 9.9* 9.2* 9.6*  HCT 12.3* 31.1* 27.0* 28.4*  MCV 93.9 91.5  --  88.2  PLT 147* 154  --  559    Basic Metabolic Panel: Recent Labs  Lab 05/12/20 0041 05/12/20 0935 05/12/20 1904 05/13/20 0530  NA 135  --  135 137  K 3.9  --  3.8 3.8  CL 104  --   --  101  CO2 21*  --   --  24  GLUCOSE 270*  --   --  158*  BUN 27*  --   --  27*  CREATININE 1.78* 1.90*  --  1.90*  CALCIUM 7.9*  --   --  8.0*  MG  --   --   --  1.5*   GFR: Estimated Creatinine Clearance: 42.1 mL/min (A) (by C-G formula based on SCr of 1.9 mg/dL (H)). Recent Labs  Lab 05/12/20 0041 05/12/20 0935 05/13/20 0530  PROCALCITON  --   --  10.74  WBC 8.7 6.2 7.0    Liver Function Tests: Recent Labs  Lab 05/12/20 0041 05/13/20 0530  AST 100* 162*  ALT 85* 103*  ALKPHOS 120 192*  BILITOT 0.5 2.1*  PROT 5.8* 5.9*  ALBUMIN 1.6* 1.4*   No results for input(s): LIPASE, AMYLASE in the last 168 hours. No results for input(s): AMMONIA in the  last 168 hours.  ABG    Component Value Date/Time   PHART 7.344 (L) 05/13/2020 0825   PCO2ART 43.3 05/13/2020 0825   PO2ART 56.5 (L) 05/13/2020 0825   HCO3 23.1 05/13/2020 0825   TCO2 27 05/12/2020 1904   ACIDBASEDEF 1.9 05/13/2020 0825   O2SAT 88.6 05/13/2020 0825     Coagulation Profile: No results for input(s): INR, PROTIME in the last 168 hours.  Cardiac Enzymes: No results for input(s): CKTOTAL, CKMB, CKMBINDEX, TROPONINI in the last 168 hours.  HbA1C: Hemoglobin A1C  Date/Time Value Ref Range Status  02/29/2020 03:58 PM 9.0 (A) 4.0 - 5.6 % Final   Hgb A1c MFr Bld  Date/Time Value Ref Range Status  04/10/2020 11:22 AM 8.6 (H) 4.8 - 5.6 % Final  Comment:    (NOTE) Pre diabetes:          5.7%-6.4%  Diabetes:              >6.4%  Glycemic control for   <7.0% adults with diabetes   09/25/2019 04:38 AM 8.3 (H) 4.8 - 5.6 % Final    Comment:    (NOTE) Pre diabetes:          5.7%-6.4%  Diabetes:              >6.4%  Glycemic control for   <7.0% adults with diabetes     CBG: Recent Labs  Lab 05/12/20 0757 05/12/20 1214 05/12/20 1635 05/12/20 2308 05/13/20 0747  GLUCAP 200* 88 119* 142* 148*    Review of Systems:   na  Past Medical History:  She,  has a past medical history of Allergy, Anemia, Anxiety, Blood transfusion without reported diagnosis, Cataract, COVID-19 (03/2020), Depression, Diabetes type 1, uncontrolled ([place]) (11/14/2011), Fibromyalgia, Gastroparesis, GERD (gastroesophageal reflux disease), Hypertension, and Infection.   Surgical History:   Past Surgical History:  Procedure Laterality Date  . AMPUTATION Right 04/13/2020   Procedure: 1ST RAY AMPUTATION RIGHT FOOT;  Surgeon: Newt Minion, MD;  Location: [place];  Service: Orthopedics;  Laterality: Right;  . ANKLE SURGERY    . CHOLECYSTECTOMY  11/15/2011   Procedure: LAPAROSCOPIC CHOLECYSTECTOMY WITH INTRAOPERATIVE CHOLANGIOGRAM;  Surgeon: [name], MD;  Location: [facility];  Service:  General;  Laterality: N/A;  . COLONOSCOPY    . COLONOSCOPY WITH PROPOFOL N/A 06/27/2017   Procedure: COLONOSCOPY WITH PROPOFOL;  Surgeon: Milus Banister, MD;  Location: [facility];  Service: Endoscopy;  Laterality: N/A;  . ESOPHAGOGASTRODUODENOSCOPY  12/03/2011   Procedure: ESOPHAGOGASTRODUODENOSCOPY (EGD);  Surgeon: Beryle Beams, MD;  Location: [facility];  Service: Endoscopy;  Laterality: N/A;  . FLEXIBLE SIGMOIDOSCOPY N/A 03/10/2017   Procedure: FLEXIBLE SIGMOIDOSCOPY;  Surgeon: Carol Ada, MD;  Location: [facility];  Service: Endoscopy;  Laterality: N/A;  . INCISION AND DRAINAGE PERIRECTAL ABSCESS N/A 03/01/2017   Procedure: IRRIGATION AND DEBRIDEMENT PERIRECTAL ABSCESS;  Surgeon: Alphonsa Overall, MD;  Location: [facility];  Service: General;  Laterality: N/A;  . IRRIGATION AND DEBRIDEMENT BUTTOCKS N/A 03/23/2017   Procedure: IRRIGATION AND DEBRIDEMENT BUTTOCKS, SETON PLACEMENT;  Surgeon: [name], MD;  Location: [facility];  Service: General;  Laterality: N/A;  . LAPAROSCOPY  11/23/2011   Procedure: LAPAROSCOPY DIAGNOSTIC;  Surgeon: Edward Jolly, MD;  Location: [facility];  Service: General;  Laterality: N/A;  . SIGMOIDOSCOPY    . UPPER GASTROINTESTINAL ENDOSCOPY    . WISDOM TOOTH EXTRACTION       Social History:   reports that she has never smoked. She has never used smokeless tobacco. She reports current drug use. Drug: Marijuana. She reports that she does not drink alcohol.   Family History:  Her family history includes Breast cancer in her paternal aunt; Colon cancer in her paternal grandmother; Diabetes in her maternal grandfather, maternal grandmother, mother, paternal grandfather, paternal grandmother, and another family member; Hypertension in her father. There is no history of Esophageal cancer, Liver cancer, Pancreatic cancer, Stomach cancer, or Rectal cancer.   Allergies Allergies  Allergen Reactions  . Other Anaphylaxis    Reaction to [place] nuts   . Lactose  Intolerance (Gi) Diarrhea     Home Medications  Prior to Admission medications   Medication Sig Start Date End Date Taking? Authorizing Provider  aluminum-magnesium hydroxide-simethicone (MAALOX) [phone] MG/5ML SUSP Take 30 mLs by  mouth 3 (three) times daily as needed (indigestion).    [provider]  busPIRone (BUSPAR) 5 MG tablet Take 1 tablet (5 mg total) by mouth 3 (three) times daily. 02/03/20   [name], MD  carvedilol (COREG) 12.5 MG tablet Take 1 tablet (12.5 mg total) by mouth 2 (two) times daily with a meal. 04/16/20 07/15/20  Dahal, Marlowe Aschoff, MD  Continuous Blood Gluc Sensor (DEXCOM G6 SENSOR) MISC USE AS DIRECTED WITH CONTINUOUS BLOOD GLUCOSE MONITOR. REPLACE EVERY 10 DAYS 02/27/20   Renato Shin, MD  Continuous Blood Gluc Transmit (DEXCOM G6 TRANSMITTER) MISC USE AS DIRECTED TO  MONITOR  GLUCOSE  LEVELS 05/10/20   Renato Shin, MD  cyclobenzaprine (FLEXERIL) 10 MG tablet TAKE 1 TABLET(10 MG) BY MOUTH THREE TIMES DAILY AS NEEDED FOR MUSCLE SPASMS 04/02/20   [name], MD  dicyclomine (BENTYL) 20 MG tablet Take 1 tablet (20 mg total) by mouth every 8 (eight) hours as needed for spasms. 11/25/19   Petrucelli, Samantha R, PA-C  gabapentin (NEURONTIN) 300 MG capsule Take 1 capsule (300 mg total) by mouth at bedtime. 04/20/20   [name], MD  hydrALAZINE (APRESOLINE) 50 MG tablet TAKE 1 TABLET(50 MG) BY MOUTH EVERY 6 HOURS Patient taking differently: Take 50 mg by mouth every 6 (six) hours. 01/30/20   [name], MD  Insulin NPH, Human,, Isophane, (HUMULIN N KWIKPEN) 100 UNIT/ML Kiwkpen Inject 20 Units into the skin every morning. 03/02/20   Renato Shin, MD  isosorbide mononitrate (IMDUR) 30 MG 24 hr tablet Take 30 mg by mouth daily. 01/03/20   [provider]  loperamide (IMODIUM) 2 MG capsule Take 6 mg by mouth daily as needed for diarrhea or loose stools.     [provider]  LORazepam (ATIVAN) 0.5 MG tablet TAKE 1 TABLET(0.5 MG) BY MOUTH  TWICE DAILY AS NEEDED FOR ANXIETY Patient taking differently: Take 0.5 mg by mouth 2 (two) times daily as needed for anxiety. 03/07/20   [name], MD  metoCLOPramide (REGLAN) 5 MG tablet Take 1 tablet (5 mg total) by mouth in the morning and at bedtime. Please keep your January appt for further refills. Thank you 03/28/20   Armbruster, Carlota Raspberry, MD  mirtazapine (REMERON) 30 MG tablet TAKE 1 TABLET BY MOUTH AT  BEDTIME Patient taking differently: Take 30 mg by mouth at bedtime. 01/17/20   [name], MD  ondansetron (ZOFRAN ODT) 4 MG disintegrating tablet Take 1 tablet (4 mg total) by mouth every 8 (eight) hours as needed for nausea or vomiting. 04/06/20   Lucretia Kern, DO  oxyCODONE-acetaminophen (PERCOCET/ROXICET) 5-325 MG tablet Take 1 tablet by mouth every 4 (four) hours as needed for severe pain. 04/23/20   Persons, Bevely Palmer, PA  pantoprazole (PROTONIX) 40 MG tablet Take 1 tablet (40 mg total) by mouth 2 (two) times daily. 12/06/19   Armbruster, Carlota Raspberry, MD  promethazine (PHENERGAN) 25 MG suppository Place 1 suppository (25 mg total) rectally every 6 (six) hours as needed for nausea or vomiting. 03/20/20   Mesner, Corene Cornea, MD  promethazine (PHENERGAN) 25 MG tablet Take 1 tablet (25 mg total) by mouth every 6 (six) hours as needed for nausea or vomiting. 03/20/20   Mesner, Corene Cornea, MD  scopolamine (TRANSDERM-SCOP) 1 MG/3DAYS Place 1 patch onto the skin every 3 (three) days.    [provider]  sucralfate (CARAFATE) 1 GM/10ML suspension Take 10 mLs (1 g total) by mouth every 6 (six) hours as needed. 11/24/19  Yetta Flock, MD  potassium chloride SA (KLOR-CON) 20 MEQ tablet Take 1 tablet (20 mEq total) by mouth daily for 5 doses. 07/04/19 07/30/19  Janeece Fitting, PA-C     Critical care time: 90 min  Richardson Landry Shevon Sian ACNP Acute Care Nurse Practitioner [place] Please consult Amion 05/13/2020, 9:46 AM

## 2020-05-13 NOTE — Procedures (Signed)
Bronchoscopy Procedure Note  [name]  [id number]  [birth year]  Date:05/13/20  Time:6:06 PM   Provider Performing:Raeleigh Guinn P [name]   Procedure(s):  Flexible bronchoscopy with bronchial alveolar lavage [phone])  Indication(s) ARDS, unknown etiology  Consent Risks of the procedure as well as the alternatives and risks of each were explained to the patient and/or caregiver.  Consent for the procedure was obtained and is signed in the bedside chart  Anesthesia Per MAR   Time Out Verified patient identification, verified procedure, site/side was marked, verified correct patient position, special equipment/implants available, medications/allergies/relevant history reviewed, required imaging and test results available.   Sterile Technique Usual hand hygiene, masks, gowns, and gloves were used   Procedure Description Bronchoscope advanced through endotracheal tube and into airway.  Airways were examined down to subsegmental level with findings noted below.   Following diagnostic evaluation, BAL(s) performed in RML with normal saline and return of 40cc fluid  Findings: cloudy, pink fluid   Complications/Tolerance transient hypoxia Chest X-ray is needed post procedure.   EBL Minimal   Specimen(s) Respiratory culture, AFB & fungal cultures, cell count with diff, PJP, viral panel testing  Julian Hy, DO 05/13/20 6:09 PM [facility]

## 2020-05-13 NOTE — Consult Note (Signed)
[facility] for Infectious Disease  Total days of antibiotics 2         Reason for Consult: sepsis and respiratory failure    Referring Physician: ramaswamy  Principal Problem:   Chronic anemia Active Problems:   Hypertension associated with diabetes ([place])   Diabetes mellitus type 1 (HCC)   Severe protein-calorie malnutrition (HCC)   Osteomyelitis (HCC)   GI bleeding   AKI (acute kidney injury) ([place])   Diarrhea   GI bleed   Respiratory failure (HCC)    HPI: [name] is a 30 y.o. 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 Also had hx of covid on 04/05/20 ( and previously in early 2021) she was discharged on 1/17 after her amputation, on follow up it was complicated by wound dehiscence noted by dr duda on 1/25. She was admitted on 1/28-2/9 at [facility] due to right foot pain and N/V found to have signs of residual osteo and abscess formation, thus underwent repeat I X D with partial cuneiform bone removal beginning february. She was discharged on daptomycin plus ciprofloxacin to take through 3/16. She now represents on 2/12 with confusion, worsening shortness of breath, and foot pain. On admit, seh was afebrile, normotensive but tachypnia, labs showed wbc of 8K, but had an acute hgb drop of 3.7, aki on ckd, with cr of 1.79. CXR showed patchy bilateral infiltrates. procalcitonin of 10.7. she received 2 unit of RBC but progressively had worsening acute respiratory hypoxia requiring bipap. Her repeat cxr shows worsening bilateral infiltrates. Unclear if aspiration vs. TRALI vs. Pneumonitis from daptomycin. ID asked to weigh in.right foot wound dehiscence likely needs further debridement per dr duda.    Past Medical History:  Diagnosis Date  . Allergy   . Anemia   . Anxiety   . Blood transfusion without reported diagnosis    Dec 2018  . Cataract    right eye  . COVID-19 03/2020  . Depression   . Diabetes type 1,  uncontrolled ([place]) 11/14/2011   Since age 89  . Fibromyalgia   . Gastroparesis   . GERD (gastroesophageal reflux disease)   . Hypertension   . Infection    UTI April 2016    Allergies:  Allergies  Allergen Reactions  . Other Anaphylaxis    Reaction to [place] nuts   . Lactose Intolerance (Gi) Diarrhea   MEDICATIONS: . carvedilol  12.5 mg Oral BID WC  . Chlorhexidine Gluconate Cloth  6 each Topical Daily  . gabapentin  300 mg Oral QHS  . hydrALAZINE  50 mg Oral Q6H  . insulin aspart  0-15 Units Subcutaneous TID WC  . insulin aspart  0-5 Units Subcutaneous QHS  . insulin NPH Human  20 Units Subcutaneous QAC breakfast  . ipratropium-albuterol  3 mL Nebulization Q4H  . mometasone-formoterol  2 puff Inhalation BID  . pantoprazole (PROTONIX) IV  40 mg Intravenous Q24H    Social History   Tobacco Use  . Smoking status: Never Smoker  . Smokeless tobacco: Never Used  Vaping Use  . Vaping Use: Never used  Substance Use Topics  . Alcohol use: No    Alcohol/week: 0.0 standard drinks  . Drug use: Yes    Types: Marijuana    Family History  Problem Relation Age of Onset  . Diabetes Mother   . Hypertension Father   . Colon cancer Paternal Grandmother  pt thinks PGM was dx in her 46's  . Diabetes Paternal Grandmother   . Diabetes Maternal Grandmother   . Diabetes Maternal Grandfather   . Diabetes Paternal Grandfather   . Diabetes Other   . Breast cancer Paternal Aunt   . Esophageal cancer Neg Hx   . Liver cancer Neg Hx   . Pancreatic cancer Neg Hx   . Stomach cancer Neg Hx   . Rectal cancer Neg Hx     Review of Systems - scared, shortness of breath, foot pain, 12 point ros is otherwise negative   OBJECTIVE: Temp:  [97.7 F (36.5 C)-99.9 F (37.7 C)] 97.8 F (36.6 C) (02/13 0750) Pulse Rate:  [88-105] 92 (02/13 0829) Resp:  [12-28] 18 (02/13 0829) BP: (123-160)/(64-92) 145/89 (02/13 0750) SpO2:  [87 %-100 %] 100 % (02/13 1123) FiO2 (%):  [70 %-100 %] 100  % (02/13 1123) Physical Exam  Constitutional:  oriented to person, place, and time. appears well-developed and well-nourished. No distress.  HENT: [place]/AT, PERRLA, no scleral icterus Mouth/Throat: Oropharynx is clear and moist. No oropharyngeal exudate. Wearing bipap Cardiovascular: Normal rate, regular rhythm and normal heart sounds. Exam reveals no gallop and no friction rub.  No murmur heard.  Pulmonary/Chest: tachypneic but not having accessory m use and breath sounds normal.  has no wheezes.  Neck = supple, no nuchal rigidity Abdominal: Soft. Bowel sounds are normal.  exhibits no distension. There is no tenderness.  Lymphadenopathy: no cervical adenopathy. No axillary adenopathy Neurological: alert and oriented to person, place, and time.  Ext: right foot wound bed has 80 % clean some macerated edges on plantar aspect. Hyperpigmentation to 2nd toe. Serous drainage. +1 pitting edema to right leg Psychiatric: tearful,   LABS: Results for orders placed or performed during the hospital encounter of 05/12/20 (from the past 48 hour(s))  CBG monitoring, ED     Status: Abnormal   Collection Time: 05/12/20 12:25 AM  Result Value Ref Range   Glucose-Capillary 256 (H) 70 - 99 mg/dL    Comment: Glucose reference range applies only to samples taken after fasting for at least 8 hours.  CBC with Differential     Status: Abnormal   Collection Time: 05/12/20 12:41 AM  Result Value Ref Range   WBC 8.7 4.0 - 10.5 K/uL   RBC 1.31 (L) 3.87 - 5.11 MIL/uL   Hemoglobin 3.7 (LL) 12.0 - 15.0 g/dL    Comment: REPEATED TO VERIFY THIS CRITICAL RESULT HAS VERIFIED AND BEEN CALLED TO C. STRAUGHAN,RN BY TERRAN TYSOR ON 02 12 2022 AT 0057, AND HAS BEEN READ BACK.     HCT 12.3 (L) 36.0 - 46.0 %   MCV 93.9 80.0 - 100.0 fL   MCH 28.2 26.0 - 34.0 pg   MCHC 30.1 30.0 - 36.0 g/dL   RDW 14.5 11.5 - 15.5 %   Platelets 147 (L) 150 - 400 K/uL   nRBC 0.0 0.0 - 0.2 %   Neutrophils Relative % 83 %   Neutro Abs 7.2 1.7 -  7.7 K/uL   Lymphocytes Relative 6 %   Lymphs Abs 0.5 (L) 0.7 - 4.0 K/uL   Monocytes Relative 10 %   Monocytes Absolute 0.9 0.1 - 1.0 K/uL   Eosinophils Relative 0 %   Eosinophils Absolute 0.0 0.0 - 0.5 K/uL   Basophils Relative 0 %   Basophils Absolute 0.0 0.0 - 0.1 K/uL   Immature Granulocytes 1 %   Abs Immature Granulocytes 0.07 0.00 - 0.07 K/uL  Comment: Performed at [facility], [place] [address], [place], [place] [address]  Comprehensive metabolic panel     Status: Abnormal   Collection Time: 05/12/20 12:41 AM  Result Value Ref Range   Sodium 135 135 - 145 mmol/L   Potassium 3.9 3.5 - 5.1 mmol/L   [place] 104 98 - 111 mmol/L   CO2 21 (L) 22 - 32 mmol/L   Glucose, Bld 270 (H) 70 - 99 mg/dL    Comment: Glucose reference range applies only to samples taken after fasting for at least 8 hours.   BUN 27 (H) 6 - 20 mg/dL   Creatinine, Ser 1.78 (H) 0.44 - 1.00 mg/dL   Calcium 7.9 (L) 8.9 - 10.3 mg/dL   Total Protein 5.8 (L) 6.5 - 8.1 g/dL   Albumin 1.6 (L) 3.5 - 5.0 g/dL   AST 100 (H) 15 - 41 U/L   ALT 85 (H) 0 - 44 U/L   Alkaline Phosphatase 120 38 - 126 U/L   Total Bilirubin 0.5 0.3 - 1.2 mg/dL   GFR, Estimated 39 (L) >60 mL/min    Comment: (NOTE) Calculated using the CKD-EPI Creatinine Equation (2021)    Anion gap 10 5 - 15    Comment: Performed at [facility], [place] [address] [place], [place] [address]  Ethanol     Status: None   Collection Time: 05/12/20 12:41 AM  Result Value Ref Range   Alcohol, Ethyl (B) <10 <10 mg/dL    Comment: (NOTE) Lowest detectable limit for serum alcohol is 10 mg/dL.  For medical purposes only. Performed at [facility], [place] [address], [place], [place] [address]   Brain natriuretic peptide     Status: Abnormal   Collection Time: 05/12/20 12:41 AM  Result Value Ref Range   B Natriuretic Peptide 399.9 (H) 0.0 - 100.0 pg/mL    Comment: Performed at [facility] [address] [place], [place] [address]   Urinalysis, Routine w reflex microscopic Urine, Catheterized     Status: Abnormal   Collection Time: 05/12/20 12:52 AM  Result Value Ref Range   Color, Urine YELLOW YELLOW   APPearance CLEAR CLEAR   Specific Gravity, Urine 1.012 1.005 - 1.030   pH 5.0 5.0 - 8.0   Glucose, UA >=500 (A) NEGATIVE mg/dL   Hgb urine dipstick SMALL (A) NEGATIVE   Bilirubin Urine NEGATIVE NEGATIVE   Ketones, ur NEGATIVE NEGATIVE mg/dL   Protein, ur >=300 (A) NEGATIVE mg/dL   Nitrite NEGATIVE NEGATIVE   Leukocytes,Ua NEGATIVE NEGATIVE   RBC / HPF 21-50 0 - 5 RBC/hpf   WBC, UA 0-5 0 - 5 WBC/hpf   Bacteria, UA RARE (A) NONE SEEN   Squamous Epithelial / LPF 0-5 0 - 5   Mucus PRESENT    Hyaline Casts, UA PRESENT     Comment: Performed at [facility] [address], [place], [place] [address]  Urine rapid drug screen (hosp performed)     Status: Abnormal   Collection Time: 05/12/20 12:52 AM  Result Value Ref Range   Opiates POSITIVE (A) NONE DETECTED   Cocaine NONE DETECTED NONE DETECTED   Benzodiazepines NONE DETECTED NONE DETECTED   Amphetamines NONE DETECTED NONE DETECTED   Tetrahydrocannabinol POSITIVE (A) NONE DETECTED   Barbiturates NONE DETECTED NONE DETECTED    Comment: (NOTE) DRUG SCREEN FOR MEDICAL PURPOSES ONLY.  IF CONFIRMATION IS NEEDED FOR ANY PURPOSE, NOTIFY LAB WITHIN 5 DAYS.  LOWEST DETECTABLE LIMITS  FOR URINE DRUG SCREEN Drug Class                     Cutoff (ng/mL) Amphetamine and metabolites    1000 Barbiturate and metabolites    200 Benzodiazepine                 947 Tricyclics and metabolites     300 Opiates and metabolites        300 Cocaine and metabolites        300 THC                            50 Performed at [facility], [place] [address] [place], [place] [address]   POC occult blood, ED     Status: Abnormal   Collection Time: 05/12/20  1:18 AM  Result Value Ref Range   Fecal Occult Bld POSITIVE (A) NEGATIVE  Type and screen     Status: None  (Preliminary result)   Collection Time: 05/12/20  1:20 AM  Result Value Ref Range   ABO/RH(D) O POS    Antibody Screen NEG    Sample Expiration 05/15/2020,2359    Unit Number [id number]    Blood Component Type RED CELLS,LR    Unit division 00    Status of Unit ISSUED    Transfusion Status OK TO TRANSFUSE    Crossmatch Result Compatible    Unit Number [id number]    Blood Component Type RED CELLS,LR    Unit division 00    Status of Unit ISSUED    Transfusion Status OK TO TRANSFUSE    Crossmatch Result      Compatible Performed at [facility], [place] [address], [place], [place] [address]   Prepare RBC (crossmatch)     Status: None   Collection Time: 05/12/20  2:00 AM  Result Value Ref Range   Order Confirmation      ORDER PROCESSED BY BLOOD BANK Performed at [facility], [place] [address] [place], [place] [address]   CBG monitoring, ED     Status: Abnormal   Collection Time: 05/12/20  7:57 AM  Result Value Ref Range   Glucose-Capillary 200 (H) 70 - 99 mg/dL    Comment: Glucose reference range applies only to samples taken after fasting for at least 8 hours.  SARS CORONAVIRUS 2 (TAT 6-24 HRS) Nasopharyngeal Nasopharyngeal Swab     Status: None   Collection Time: 05/12/20  8:21 AM   Specimen: Nasopharyngeal Swab  Result Value Ref Range   SARS Coronavirus 2 NEGATIVE NEGATIVE    Comment: (NOTE) SARS-CoV-2 target nucleic acids are NOT DETECTED.  The SARS-CoV-2 RNA is generally detectable in upper and lower respiratory specimens during the acute phase of infection. Negative results do not preclude SARS-CoV-2 infection, do not rule out co-infections with other pathogens, and should not be used as the sole basis for treatment or other patient management decisions. Negative results must be combined with clinical observations, patient history, and epidemiological information. The expected result is Negative.  Fact Sheet for  Patients: SugarRoll.be  Fact Sheet for Healthcare Providers: https://www.woods-mathews.com/  This test is not yet approved or cleared by the [place] FDA and  has been authorized for detection and/or diagnosis of SARS-CoV-2 by FDA under an Emergency Use Authorization (EUA). This EUA will remain  in effect (meaning this test can be used) for the duration of the COVID-19 declaration under  Se ction 564(b)(1) of the Act, 21 U.S.C. section 360bbb-3(b)(1), unless the authorization is terminated or revoked sooner.  Performed at [facility], [place] [address], [place], [place] [address]   HIV Antibody (routine testing w rflx)     Status: None   Collection Time: 05/12/20  9:35 AM  Result Value Ref Range   HIV Screen 4th Generation wRfx Non Reactive Non Reactive    Comment: Performed at [facility], [place] [address], [place], [place] [address]  CBC     Status: Abnormal   Collection Time: 05/12/20  9:35 AM  Result Value Ref Range   WBC 6.2 4.0 - 10.5 K/uL   RBC 3.40 (L) 3.87 - 5.11 MIL/uL   Hemoglobin 9.9 (L) 12.0 - 15.0 g/dL    Comment: REPEATED TO VERIFY POST TRANSFUSION SPECIMEN    HCT 31.1 (L) 36.0 - 46.0 %   MCV 91.5 80.0 - 100.0 fL   MCH 29.1 26.0 - 34.0 pg   MCHC 31.8 30.0 - 36.0 g/dL   RDW 14.5 11.5 - 15.5 %   Platelets 154 150 - 400 K/uL   nRBC 0.0 0.0 - 0.2 %    Comment: Performed at [facility], [place] [address], [place], [place] [address]  Creatinine, serum     Status: Abnormal   Collection Time: 05/12/20  9:35 AM  Result Value Ref Range   Creatinine, Ser 1.90 (H) 0.44 - 1.00 mg/dL   GFR, Estimated 36 (L) >60 mL/min    Comment: (NOTE) Calculated using the CKD-EPI Creatinine Equation (2021) Performed at [facility] [address] [place], [place] [address]   CBG monitoring, ED     Status: None   Collection Time: 05/12/20 12:14 PM  Result Value Ref Range   Glucose-Capillary 88 70 - 99 mg/dL     Comment: Glucose reference range applies only to samples taken after fasting for at least 8 hours.  CBG monitoring, ED     Status: Abnormal   Collection Time: 05/12/20  4:35 PM  Result Value Ref Range   Glucose-Capillary 119 (H) 70 - 99 mg/dL    Comment: Glucose reference range applies only to samples taken after fasting for at least 8 hours.  I-Stat arterial blood gas, ED     Status: Abnormal   Collection Time: 05/12/20  7:04 PM  Result Value Ref Range   pH, Arterial 7.438 7.350 - 7.450   pCO2 arterial 37.5 32.0 - 48.0 mmHg   pO2, Arterial 115 (H) 83.0 - 108.0 mmHg   Bicarbonate 25.4 20.0 - 28.0 mmol/L   TCO2 27 22 - 32 mmol/L   O2 Saturation 99.0 %   Acid-Base Excess 1.0 0.0 - 2.0 mmol/L   Sodium 135 135 - 145 mmol/L   Potassium 3.8 3.5 - 5.1 mmol/L   Calcium, Ion 1.19 1.15 - 1.40 mmol/L   HCT 27.0 (L) 36.0 - 46.0 %   Hemoglobin 9.2 (L) 12.0 - 15.0 g/dL   Sample type ARTERIAL   Glucose, capillary     Status: Abnormal   Collection Time: 05/12/20 11:08 PM  Result Value Ref Range   Glucose-Capillary 142 (H) 70 - 99 mg/dL    Comment: Glucose reference range applies only to samples taken after fasting for at least 8 hours.  CBC with Differential/Platelet     Status: Abnormal   Collection Time: 05/13/20  5:30 AM  Result Value Ref Range   WBC 7.0 4.0 - 10.5 K/uL  RBC 3.22 (L) 3.87 - 5.11 MIL/uL   Hemoglobin 9.6 (L) 12.0 - 15.0 g/dL   HCT 28.4 (L) 36.0 - 46.0 %   MCV 88.2 80.0 - 100.0 fL   MCH 29.8 26.0 - 34.0 pg   MCHC 33.8 30.0 - 36.0 g/dL   RDW 14.6 11.5 - 15.5 %   Platelets 151 150 - 400 K/uL   nRBC 0.0 0.0 - 0.2 %   Neutrophils Relative % 86 %   Neutro Abs 6.0 1.7 - 7.7 K/uL   Lymphocytes Relative 10 %   Lymphs Abs 0.7 0.7 - 4.0 K/uL   Monocytes Relative 3 %   Monocytes Absolute 0.2 0.1 - 1.0 K/uL   Eosinophils Relative 0 %   Eosinophils Absolute 0.0 0.0 - 0.5 K/uL   Basophils Relative 1 %   Basophils Absolute 0.0 0.0 - 0.1 K/uL   WBC Morphology INCREASED BANDS (>20%  BANDS)     Comment: VACUOLATED NEUTROPHILS   Immature Granulocytes 0 %   Abs Immature Granulocytes 0.02 0.00 - 0.07 K/uL    Comment: Performed at [facility] [address], [place], [place] [address]  Comprehensive metabolic panel     Status: Abnormal   Collection Time: 05/13/20  5:30 AM  Result Value Ref Range   Sodium 137 135 - 145 mmol/L   Potassium 3.8 3.5 - 5.1 mmol/L   [place] 101 98 - 111 mmol/L   CO2 24 22 - 32 mmol/L   Glucose, Bld 158 (H) 70 - 99 mg/dL    Comment: Glucose reference range applies only to samples taken after fasting for at least 8 hours.   BUN 27 (H) 6 - 20 mg/dL   Creatinine, Ser 1.90 (H) 0.44 - 1.00 mg/dL   Calcium 8.0 (L) 8.9 - 10.3 mg/dL   Total Protein 5.9 (L) 6.5 - 8.1 g/dL   Albumin 1.4 (L) 3.5 - 5.0 g/dL   AST 162 (H) 15 - 41 U/L   ALT 103 (H) 0 - 44 U/L   Alkaline Phosphatase 192 (H) 38 - 126 U/L   Total Bilirubin 2.1 (H) 0.3 - 1.2 mg/dL   GFR, Estimated 36 (L) >60 mL/min    Comment: (NOTE) Calculated using the CKD-EPI Creatinine Equation (2021)    Anion gap 12 5 - 15    Comment: Performed at [facility], [place] [address], [place], [place] [address]  Magnesium     Status: Abnormal   Collection Time: 05/13/20  5:30 AM  Result Value Ref Range   Magnesium 1.5 (L) 1.7 - 2.4 mg/dL    Comment: Performed at [facility] [address] [place], [place] [address]  C-reactive protein     Status: Abnormal   Collection Time: 05/13/20  5:30 AM  Result Value Ref Range   CRP 30.1 (H) <1.0 mg/dL    Comment: Performed at [facility] [address] [place], [place] [address]  Procalcitonin     Status: None   Collection Time: 05/13/20  5:30 AM  Result Value Ref Range   Procalcitonin 10.74 ng/mL    Comment:        Interpretation: PCT >= 10 ng/mL: Important systemic inflammatory response, almost exclusively due to severe bacterial sepsis or septic shock. (NOTE)       Sepsis PCT Algorithm           Lower Respiratory Tract  Infection PCT Algorithm    ----------------------------     ----------------------------         PCT < 0.25 ng/mL                PCT < 0.10 ng/mL          Strongly encourage             Strongly discourage   discontinuation of antibiotics    initiation of antibiotics    ----------------------------     -----------------------------       PCT 0.25 - 0.50 ng/mL            PCT 0.10 - 0.25 ng/mL               OR       >80% decrease in PCT            Discourage initiation of                                            antibiotics      Encourage discontinuation           of antibiotics    ----------------------------     -----------------------------         PCT >= 0.50 ng/mL              PCT 0.26 - 0.50 ng/mL                AND       <80% decrease in PCT             Encourage initiation of                                             antibiotics       Encourage continuation           of antibiotics    ----------------------------     -----------------------------        PCT >= 0.50 ng/mL                  PCT > 0.50 ng/mL               AND         increase in PCT                  Strongly encourage                                      initiation of antibiotics    Strongly encourage escalation           of antibiotics                                     -----------------------------                                           PCT <= 0.25 ng/mL  OR                                        > 80% decrease in PCT                                      Discontinue / Do not initiate                                             antibiotics  Performed at [facility], [place] [address], [place], [place] [address]   Ferritin     Status: Abnormal   Collection Time: 05/13/20  5:30 AM  Result Value Ref Range   Ferritin 1,089 (H) 11 - 307 ng/mL    Comment: Performed at [facility], [place] [address],  [place], [place] [address]  C Difficile Quick Screen w PCR reflex     Status: None   Collection Time: 05/13/20  6:27 AM   Specimen: STOOL  Result Value Ref Range   C Diff antigen NEGATIVE NEGATIVE   C Diff toxin NEGATIVE NEGATIVE   C Diff interpretation No C. difficile detected.     Comment: Performed at [facility], [place] [address] [place], [place] [address]  D-dimer, quantitative (not at [facility])     Status: Abnormal   Collection Time: 05/13/20  6:58 AM  Result Value Ref Range   D-Dimer, Quant 15.09 (H) 0.00 - 0.50 ug/mL-FEU    Comment: (NOTE) At the manufacturer cut-off value of 0.5 g/mL FEU, this assay has a negative predictive value of 95-100%.This assay is intended for use in conjunction with a clinical pretest probability (PTP) assessment model to exclude pulmonary embolism (PE) and deep venous thrombosis (DVT) in outpatients suspected of PE or DVT. Results should be correlated with clinical presentation. Performed at [facility], [place] [address] [place], [place] [address]   Glucose, capillary     Status: Abnormal   Collection Time: 05/13/20  7:47 AM  Result Value Ref Range   Glucose-Capillary 148 (H) 70 - 99 mg/dL    Comment: Glucose reference range applies only to samples taken after fasting for at least 8 hours.  Blood gas, arterial     Status: Abnormal   Collection Time: 05/13/20  8:25 AM  Result Value Ref Range   FIO2 100.00    pH, Arterial 7.344 (L) 7.350 - 7.450   pCO2 arterial 43.3 32.0 - 48.0 mmHg   pO2, Arterial 56.5 (L) 83.0 - 108.0 mmHg   Bicarbonate 23.1 20.0 - 28.0 mmol/L   Acid-base deficit 1.9 0.0 - 2.0 mmol/L   O2 Saturation 88.6 %   Patient temperature 36.5    Collection site RIGHT RADIAL    Drawn by [phone]    Sample type ARTERIAL DRAW    Allens test (pass/fail) PASS PASS    Comment: Performed at [facility], [place] [address], [place], [place] [address]    MICRO:  IMAGING: DG CHEST PORT 1 VIEW  Result Date: 05/13/2020 CLINICAL  DATA:  Hypoxia. EXAM: PORTABLE CHEST 1 VIEW COMPARISON:  Chest x-ray from yesterday. FINDINGS: Unchanged right upper extremity PICC line with the tip at the  cavoatrial junction. Stable cardiomediastinal silhouette, enlarged compared to 3 weeks ago. Significantly worsened diffuse bilateral airspace disease, more confluent in the interval, with relative sparing of the bases. No pneumothorax or pleural effusion. No acute osseous abnormality. IMPRESSION: 1. Significantly worsened diffuse bilateral airspace disease may reflect pneumonia, edema, or ARDS. 2. Unchanged borderline cardiomegaly. Electronically Signed   By: Titus Dubin M.D.   On: 05/13/2020 09:11   DG Chest Portable 1 View  Result Date: 05/12/2020 CLINICAL DATA:  PICC placement. EXAM: PORTABLE CHEST 1 VIEW COMPARISON:  04/25/2020. FINDINGS: Right upper extremity PICC tip at the atrial caval junction. Increased heart size from prior exam. Patchy heterogeneous bilateral lung opacities, greatest in the suprahilar region. Possible right pleural effusion. No pneumothorax. IMPRESSION: 1. Right upper extremity PICC tip at the atrial caval junction. 2. Patchy heterogeneous bilateral lung opacities which may represent multifocal pneumonia or pulmonary edema. Increased heart size over the past 2 weeks, consider echocardiogram to assess for pericardial effusion. Electronically Signed   By: Keith Rake M.D.   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, vs transfusion reaction vs aspiration in addition to wound dehiscence from recent 1st ray amputation to right foot  - recommend to change antibiotics to amp/sub (aspiration coverage) plus linezolid (MRSA coverage) for the time being - avoid vancomycin given aki - if she undergoes bronch, please check for eosinophilia cell count - will add  differential to cbc - consider steroids dosing (solumedrol 31m/kg) to see if any improvement  Drug monitoring = will check plt while on linezolid  Wound dehiscence to previous DFU-osteomyelitis of right foot = continue on amp/sub plus linezolid, would recommend repeat cultures when she is stable for I x D. Has no hx of pseudomonas infection  Recommend q shift dressing change with aquacel plus abd pads. Have wound care consult tomorrow for official recs  Type 1dm = per icu protocol for management of DM

## 2020-05-13 NOTE — Progress Notes (Signed)
[facility] Progress Note Patient Name: [name] DOB: [birth year] MRN: [id number]   Date of Service  05/13/2020  HPI/Events of Note  RN discussed about SBP 120's.   eICU Interventions  Ok to hold scheduled hydralazine now. Has one scheduled back at at 3 AM.       Intervention Category Intermediate Interventions: Other:;Hypertension - evaluation and management  Elmer Sow 05/13/2020, 9:55 PM

## 2020-05-13 NOTE — Progress Notes (Addendum)
Patient ID: [name], female   DOB: Feb 07, 1991, 30 y.o.   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 She presented on 05/12/2020 with confusion and lethargy. She cannot provide any history on presentation. She was found to have hemoglobin of 3.7 for which she was transfused 2 unit packed red cells. GI was consulted. She was also found to have multifocal pneumonia requiring supplemental oxygen   Assessment & Plan:   Acute hypoxic respiratory failure Probable aspiration pneumonia Acute metabolic encephalopathy -Patient presented with altered mental status and lethargy and was found to have multifocal pneumonia requiring supplemental oxygen. Her oxygen requirement has worsened and has been on BiPAP since last night. Chest x-ray this morning shows worsening opacities. ABG this morning shows PO2 in the 50s. She was given 1 dose of Lasix yesterday. Will give 1 more dose of Lasix this morning. Off IV fluids. -Mental status still not back to baseline yet -COVID-19 test negative. Procalcitonin and CRP are elevated but she also has an active right foot wound -Continue Unasyn and daptomycin. -I have spoken to PCCM on phone who will see this patient in consultation -Fall precautions  Right foot osteomyelitis requiring recent right first ray amputation at [facility] followed by I&D at [facility]  requiring wound VAC and discharged home on daptomycin and Cipro -Antibiotic plan as above. Dr. [name] following and recommending right transtibial amputation. Patient undecided yet. Currently not stable to undergo surgery because of respiratory status  Acute on chronic anemia -Questionable cause. Hemoglobin was 3.7 on presentation. She was transfused 2 units packed red cells. Hemoglobin this morning is 9.6. GI following. Continue Protonix. Monitor H&H. No signs of overt GI bleeding  Acute kidney injury -Probably from poor oral intake and dehydration. Treated with IV fluids on presentation. Subsequently IV fluids have been discontinued because of hypoxia. Creatinine 1.9 this morning.  Elevated LFTs -Questionable cause.  Monitor LFTs  Diarrhea -Prior to presentation. No diarrhea since admission hence C. difficile testing has not been performed  Diabetes mellitus type 1 with hyperglycemia and with gastroparesis and multiple admissions with intractable nausea and vomiting -GI following. Continue CBGs with SSI. Hold NPH this morning because of blood sugar being on the lower side and patient being n.p.o.  Hypomagnesemia -Replace. Repeat a.m. labs   DVT prophylaxis: SCDs Code Status: Full Family Communication: None Disposition Plan: Status is: Inpatient  Remains inpatient appropriate because:Inpatient level of care appropriate due to severity of illness   Dispo: The patient is from: Home              Anticipated d/c is to: Home              Anticipated d/c date is: > 3 days              Patient currently is not medically stable to d/c.   Difficult to place patient No  Consultants: GI/orthopedic/PCCM  Procedures: None  Antimicrobials:  Anti-infectives (From admission, onward)   Start  Dose/Rate Route Frequency Ordered Stop   05/12/20 2000  ciprofloxacin (CIPRO) tablet 500 mg  Status:  Discontinued        500 mg Oral 2 times daily 05/12/20 0257 05/12/20 1013   05/12/20 1330   Ampicillin-Sulbactam (UNASYN) 3 g in sodium chloride 0.9 % 100 mL IVPB        3 g 200 mL/hr over 30 Minutes Intravenous Every 6 hours 05/12/20 1323     05/12/20 1200  DAPTOmycin (CUBICIN) 500 mg in sodium chloride 0.9 % IVPB        500 mg 220 mL/hr over 30 Minutes Intravenous Daily 05/12/20 0257     05/12/20 0800  ciprofloxacin (CIPRO) IVPB 400 mg        400 mg 200 mL/hr over 60 Minutes Intravenous  Once 05/12/20 0257 05/12/20 6962       Subjective: Patient seen and examined at bedside. She is currently on BiPAP, wakes up slightly, nods her head to some questions. Poor historian. Nursing staff tried to take her off BiPAP but her saturations dropped to the 80s. No overnight fever or vomiting reported.   Objective: Vitals:   05/12/20 2348 05/13/20 0348 05/13/20 0750 05/13/20 0829  BP: 137/79  (!) 145/89   Pulse: 100 88 93 92  Resp: (!) 23 20 14 18   Temp: 97.7 F (36.5 C)  97.8 F (36.6 C)   TempSrc: Oral  Axillary   SpO2: 95% 99% 100% 100%  Weight:      Height:        Intake/Output Summary (Last 24 hours) at 05/13/2020 0928 Last data filed at 05/13/2020 0558 Gross per 24 hour  Intake 725.69 ml  Output 800 ml  Net -74.31 ml   Filed Weights   05/12/20 0918 05/12/20 1035  Weight: 71.2 kg 71 kg    Examination:  General exam: Looks chronically ill. Poor historian. Nods her head to some questions ENT: Currently on BiPAP Respiratory system: Bilateral decreased breath sounds at bases with scattered crackles Cardiovascular system: S1 & S2 heard, Rate controlled Gastrointestinal system: Abdomen is nondistended, soft and nontender. Normal bowel sounds heard. Extremities: No cyanosis, clubbing; trace lower extremity edema present. Right foot dressing present Central nervous system: Sleepy, wakes up slightly, does not participate in communication much. No focal neurological deficits. Moving extremities Skin: No other ecchymosis/lesions Psychiatry: Could not be assessed because of  mental status    Data Reviewed: I have personally reviewed following labs and imaging studies  CBC: Recent Labs  Lab 05/12/20 0041 05/12/20 0935 05/12/20 1904 05/13/20 0530  WBC 8.7 6.2  --  7.0  NEUTROABS 7.2  --   --  6.0  HGB 3.7* 9.9* 9.2* 9.6*  HCT 12.3* 31.1* 27.0* 28.4*  MCV 93.9 91.5  --  88.2  PLT 147* 154  --  952   Basic Metabolic Panel: Recent Labs  Lab 05/12/20 0041 05/12/20 0935 05/12/20 1904 05/13/20 0530  NA 135  --  135 137  K 3.9  --  3.8 3.8  CL 104  --   --  101  CO2 21*  --   --  24  GLUCOSE 270*  --   --  158*  BUN 27*  --   --  27*  CREATININE 1.78* 1.90*  --  1.90*  CALCIUM 7.9*  --   --  8.0*  MG  --   --   --  1.5*   GFR: Estimated Creatinine Clearance: 42.1 mL/min (A) (by C-G formula based  on SCr of 1.9 mg/dL (H)). Liver Function Tests: Recent Labs  Lab 05/12/20 0041 05/13/20 0530  AST 100* 162*  ALT 85* 103*  ALKPHOS 120 192*  BILITOT 0.5 2.1*  PROT 5.8* 5.9*  ALBUMIN 1.6* 1.4*   No results for input(s): LIPASE, AMYLASE in the last 168 hours. No results for input(s): AMMONIA in the last 168 hours. Coagulation Profile: No results for input(s): INR, PROTIME in the last 168 hours. Cardiac Enzymes: No results for input(s): CKTOTAL, CKMB, CKMBINDEX, TROPONINI in the last 168 hours. BNP (last 3 results) No results for input(s): PROBNP in the last 8760 hours. HbA1C: No results for input(s): HGBA1C in the last 72 hours. CBG: Recent Labs  Lab 05/12/20 0757 05/12/20 1214 05/12/20 1635 05/12/20 2308 05/13/20 0747  GLUCAP 200* 88 119* 142* 148*   Lipid Profile: No results for input(s): CHOL, HDL, LDLCALC, TRIG, CHOLHDL, LDLDIRECT in the last 72 hours. Thyroid Function Tests: No results for input(s): TSH, T4TOTAL, FREET4, T3FREE, THYROIDAB in the last 72 hours. Anemia Panel: Recent Labs    05/13/20 0530  FERRITIN 1,089*   Sepsis Labs: Recent Labs  Lab 05/13/20 0530  PROCALCITON 10.74    Recent Results (from the  past 240 hour(s))  SARS CORONAVIRUS 2 (TAT 6-24 HRS) Nasopharyngeal Nasopharyngeal Swab     Status: None   Collection Time: 05/12/20  8:21 AM   Specimen: Nasopharyngeal Swab  Result Value Ref Range Status   SARS Coronavirus 2 NEGATIVE NEGATIVE Final    Comment: (NOTE) SARS-CoV-2 target nucleic acids are NOT DETECTED.  The SARS-CoV-2 RNA is generally detectable in upper and lower respiratory specimens during the acute phase of infection. Negative results do not preclude SARS-CoV-2 infection, do not rule out co-infections with other pathogens, and should not be used as the sole basis for treatment or other patient management decisions. Negative results must be combined with clinical observations, patient history, and epidemiological information. The expected result is Negative.  Fact Sheet for Patients: SugarRoll.be  Fact Sheet for Healthcare Providers: https://www.woods-mathews.com/  This test is not yet approved or cleared by the [place] FDA and  has been authorized for detection and/or diagnosis of SARS-CoV-2 by FDA under an Emergency Use Authorization (EUA). This EUA will remain  in effect (meaning this test can be used) for the duration of the COVID-19 declaration under Se ction 564(b)(1) of the Act, 21 U.S.C. section 360bbb-3(b)(1), unless the authorization is terminated or revoked sooner.  Performed at [facility], [place] [address] [place], [place] [address]          Radiology Studies: DG CHEST PORT 1 VIEW  Result Date: 05/13/2020 CLINICAL DATA:  Hypoxia. EXAM: PORTABLE CHEST 1 VIEW COMPARISON:  Chest x-ray from yesterday. FINDINGS: Unchanged right upper extremity PICC line with the tip at the cavoatrial junction. Stable cardiomediastinal silhouette, enlarged compared to 3 weeks ago. Significantly worsened diffuse bilateral airspace disease, more confluent in the interval, with relative sparing of the bases. No  pneumothorax or pleural effusion. No acute osseous abnormality. IMPRESSION: 1. Significantly worsened diffuse bilateral airspace disease may reflect pneumonia, edema, or ARDS. 2. Unchanged borderline cardiomegaly. Electronically Signed   By: Titus Dubin M.D.   On: 05/13/2020 09:11   DG Chest Portable 1 View  Result Date: 05/12/2020 CLINICAL DATA:  PICC placement. EXAM: PORTABLE CHEST 1 VIEW COMPARISON:  04/25/2020. FINDINGS: Right upper extremity PICC tip at the atrial caval junction. Increased heart size from prior exam. Patchy heterogeneous bilateral lung opacities, greatest in the suprahilar  region. Possible right pleural effusion. No pneumothorax. IMPRESSION: 1. Right upper extremity PICC tip at the atrial caval junction. 2. Patchy heterogeneous bilateral lung opacities which may represent multifocal pneumonia or pulmonary edema. Increased heart size over the past 2 weeks, consider echocardiogram to assess for pericardial effusion. Electronically Signed   By: Keith Rake M.D.   On: 05/12/2020 01:17        Scheduled Meds: . carvedilol  12.5 mg Oral BID WC  . Chlorhexidine Gluconate Cloth  6 each Topical Daily  . furosemide  60 mg Intravenous Once  . gabapentin  300 mg Oral QHS  . hydrALAZINE  50 mg Oral Q6H  . insulin aspart  0-15 Units Subcutaneous TID WC  . insulin aspart  0-5 Units Subcutaneous QHS  . insulin NPH Human  20 Units Subcutaneous QAC breakfast  . ipratropium-albuterol  3 mL Nebulization Q4H  . mometasone-formoterol  2 puff Inhalation BID  . pantoprazole (PROTONIX) IV  40 mg Intravenous Q24H   Continuous Infusions: . acetaminophen 1,000 mg (05/13/20 0518)  . ampicillin-sulbactam (UNASYN) IV 3 g (05/13/20 0806)  . DAPTOmycin (CUBICIN)  IV Stopped (05/12/20 1617)  . magnesium sulfate bolus IVPB    . potassium chloride            Aline August, MD [facility] 05/13/2020, 9:28 AM

## 2020-05-14 ENCOUNTER — Inpatient Hospital Stay (HOSPITAL_COMMUNITY): Payer: 59

## 2020-05-14 DIAGNOSIS — J9601 Acute respiratory failure with hypoxia: Secondary | ICD-10-CM | POA: Diagnosis not present

## 2020-05-14 DIAGNOSIS — J9602 Acute respiratory failure with hypercapnia: Secondary | ICD-10-CM | POA: Diagnosis not present

## 2020-05-14 DIAGNOSIS — Z9911 Dependence on respirator [ventilator] status: Secondary | ICD-10-CM | POA: Diagnosis not present

## 2020-05-14 DIAGNOSIS — T8781 Dehiscence of amputation stump: Secondary | ICD-10-CM | POA: Diagnosis not present

## 2020-05-14 DIAGNOSIS — J189 Pneumonia, unspecified organism: Secondary | ICD-10-CM | POA: Diagnosis not present

## 2020-05-14 DIAGNOSIS — M86171 Other acute osteomyelitis, right ankle and foot: Secondary | ICD-10-CM | POA: Diagnosis not present

## 2020-05-14 DIAGNOSIS — N179 Acute kidney failure, unspecified: Secondary | ICD-10-CM | POA: Diagnosis not present

## 2020-05-14 LAB — CBC WITH DIFFERENTIAL/PLATELET
Abs Immature Granulocytes: 0 10*3/uL (ref 0.00–0.07)
Basophils Absolute: 0 10*3/uL (ref 0.0–0.1)
Basophils Relative: 0 %
Eosinophils Absolute: 0.2 10*3/uL (ref 0.0–0.5)
Eosinophils Relative: 7 %
HCT: 26.6 % — ABNORMAL LOW (ref 36.0–46.0)
Hemoglobin: 8.9 g/dL — ABNORMAL LOW (ref 12.0–15.0)
Immature Granulocytes: 0 %
Lymphocytes Relative: 17 %
Lymphs Abs: 0.5 10*3/uL — ABNORMAL LOW (ref 0.7–4.0)
MCH: 30 pg (ref 26.0–34.0)
MCHC: 33.5 g/dL (ref 30.0–36.0)
MCV: 89.6 fL (ref 80.0–100.0)
Monocytes Absolute: 0.1 10*3/uL (ref 0.1–1.0)
Monocytes Relative: 3 %
Neutro Abs: 2.1 10*3/uL (ref 1.7–7.7)
Neutrophils Relative %: 73 %
Platelets: 135 10*3/uL — ABNORMAL LOW (ref 150–400)
RBC: 2.97 MIL/uL — ABNORMAL LOW (ref 3.87–5.11)
RDW: 14.7 % (ref 11.5–15.5)
WBC: 2.9 10*3/uL — ABNORMAL LOW (ref 4.0–10.5)
nRBC: 0 % (ref 0.0–0.2)

## 2020-05-14 LAB — PHOSPHORUS
Phosphorus: 4.7 mg/dL — ABNORMAL HIGH (ref 2.5–4.6)
Phosphorus: 5.1 mg/dL — ABNORMAL HIGH (ref 2.5–4.6)

## 2020-05-14 LAB — COMPREHENSIVE METABOLIC PANEL
ALT: 77 U/L — ABNORMAL HIGH (ref 0–44)
AST: 78 U/L — ABNORMAL HIGH (ref 15–41)
Albumin: 1.2 g/dL — ABNORMAL LOW (ref 3.5–5.0)
Alkaline Phosphatase: 134 U/L — ABNORMAL HIGH (ref 38–126)
Anion gap: 12 (ref 5–15)
BUN: 28 mg/dL — ABNORMAL HIGH (ref 6–20)
CO2: 24 mmol/L (ref 22–32)
Calcium: 7.7 mg/dL — ABNORMAL LOW (ref 8.9–10.3)
Chloride: 100 mmol/L (ref 98–111)
Creatinine, Ser: 1.89 mg/dL — ABNORMAL HIGH (ref 0.44–1.00)
GFR, Estimated: 36 mL/min — ABNORMAL LOW (ref >60)
Glucose, Bld: 139 mg/dL — ABNORMAL HIGH (ref 70–99)
Potassium: 3.6 mmol/L (ref 3.5–5.1)
Sodium: 136 mmol/L (ref 135–145)
Total Bilirubin: 1.8 mg/dL — ABNORMAL HIGH (ref 0.3–1.2)
Total Protein: 5.5 g/dL — ABNORMAL LOW (ref 6.5–8.1)

## 2020-05-14 LAB — GLUCOSE, CAPILLARY
Glucose-Capillary: 122 mg/dL — ABNORMAL HIGH (ref 70–99)
Glucose-Capillary: 125 mg/dL — ABNORMAL HIGH (ref 70–99)
Glucose-Capillary: 127 mg/dL — ABNORMAL HIGH (ref 70–99)
Glucose-Capillary: 136 mg/dL — ABNORMAL HIGH (ref 70–99)
Glucose-Capillary: 136 mg/dL — ABNORMAL HIGH (ref 70–99)
Glucose-Capillary: 148 mg/dL — ABNORMAL HIGH (ref 70–99)
Glucose-Capillary: 172 mg/dL — ABNORMAL HIGH (ref 70–99)

## 2020-05-14 LAB — D-DIMER, QUANTITATIVE: D-Dimer, Quant: 10.55 ug/mL-FEU — ABNORMAL HIGH (ref 0.00–0.50)

## 2020-05-14 LAB — PROCALCITONIN: Procalcitonin: 14.62 ng/mL

## 2020-05-14 LAB — MAGNESIUM
Magnesium: 1.8 mg/dL (ref 1.7–2.4)
Magnesium: 1.7 mg/dL (ref 1.7–2.4)

## 2020-05-14 LAB — C-REACTIVE PROTEIN: CRP: 42.4 mg/dL — ABNORMAL HIGH (ref <1.0)

## 2020-05-14 LAB — TRIGLYCERIDES: Triglycerides: 373 mg/dL — ABNORMAL HIGH (ref <150)

## 2020-05-14 LAB — SJOGRENS SYNDROME-B EXTRACTABLE NUCLEAR ANTIBODY: SSB (La) (ENA) Antibody, IgG: <0.2 AI (ref 0.0–0.9)

## 2020-05-14 LAB — SJOGRENS SYNDROME-A EXTRACTABLE NUCLEAR ANTIBODY: SSA (Ro) (ENA) Antibody, IgG: <0.2 AI (ref 0.0–0.9)

## 2020-05-14 LAB — MPO/PR-3 (ANCA) ANTIBODIES
ANCA Proteinase 3: <3.5 U/mL (ref 0.0–3.5)
Myeloperoxidase Abs: <9 U/mL (ref 0.0–9.0)

## 2020-05-14 LAB — SAR COV2 SEROLOGY (COVID19)AB(IGG),IA: SARS-CoV-2 Ab, IgG: REACTIVE — AB

## 2020-05-14 LAB — CYCLIC CITRUL PEPTIDE ANTIBODY, IGG/IGA: CCP Antibodies IgG/IgA: 3 units (ref 0–19)

## 2020-05-14 LAB — RHEUMATOID FACTOR: Rheumatoid fact SerPl-aCnc: 29.2 IU/mL — ABNORMAL HIGH (ref <14.0)

## 2020-05-14 LAB — ANTI-DNA ANTIBODY, DOUBLE-STRANDED: ds DNA Ab: <1 IU/mL (ref 0–9)

## 2020-05-14 LAB — ANTI-SCLERODERMA ANTIBODY: Scleroderma (Scl-70) (ENA) Antibody, IgG: <0.2 AI (ref 0.0–0.9)

## 2020-05-14 IMAGING — DX DG CHEST 1V PORT
1 series · 1 of 1 positions shown · non-contrast
Comparison: Yesterday

CLINICAL DATA: Abnormal respiration

EXAM:
PORTABLE CHEST 1 VIEW

[chest]
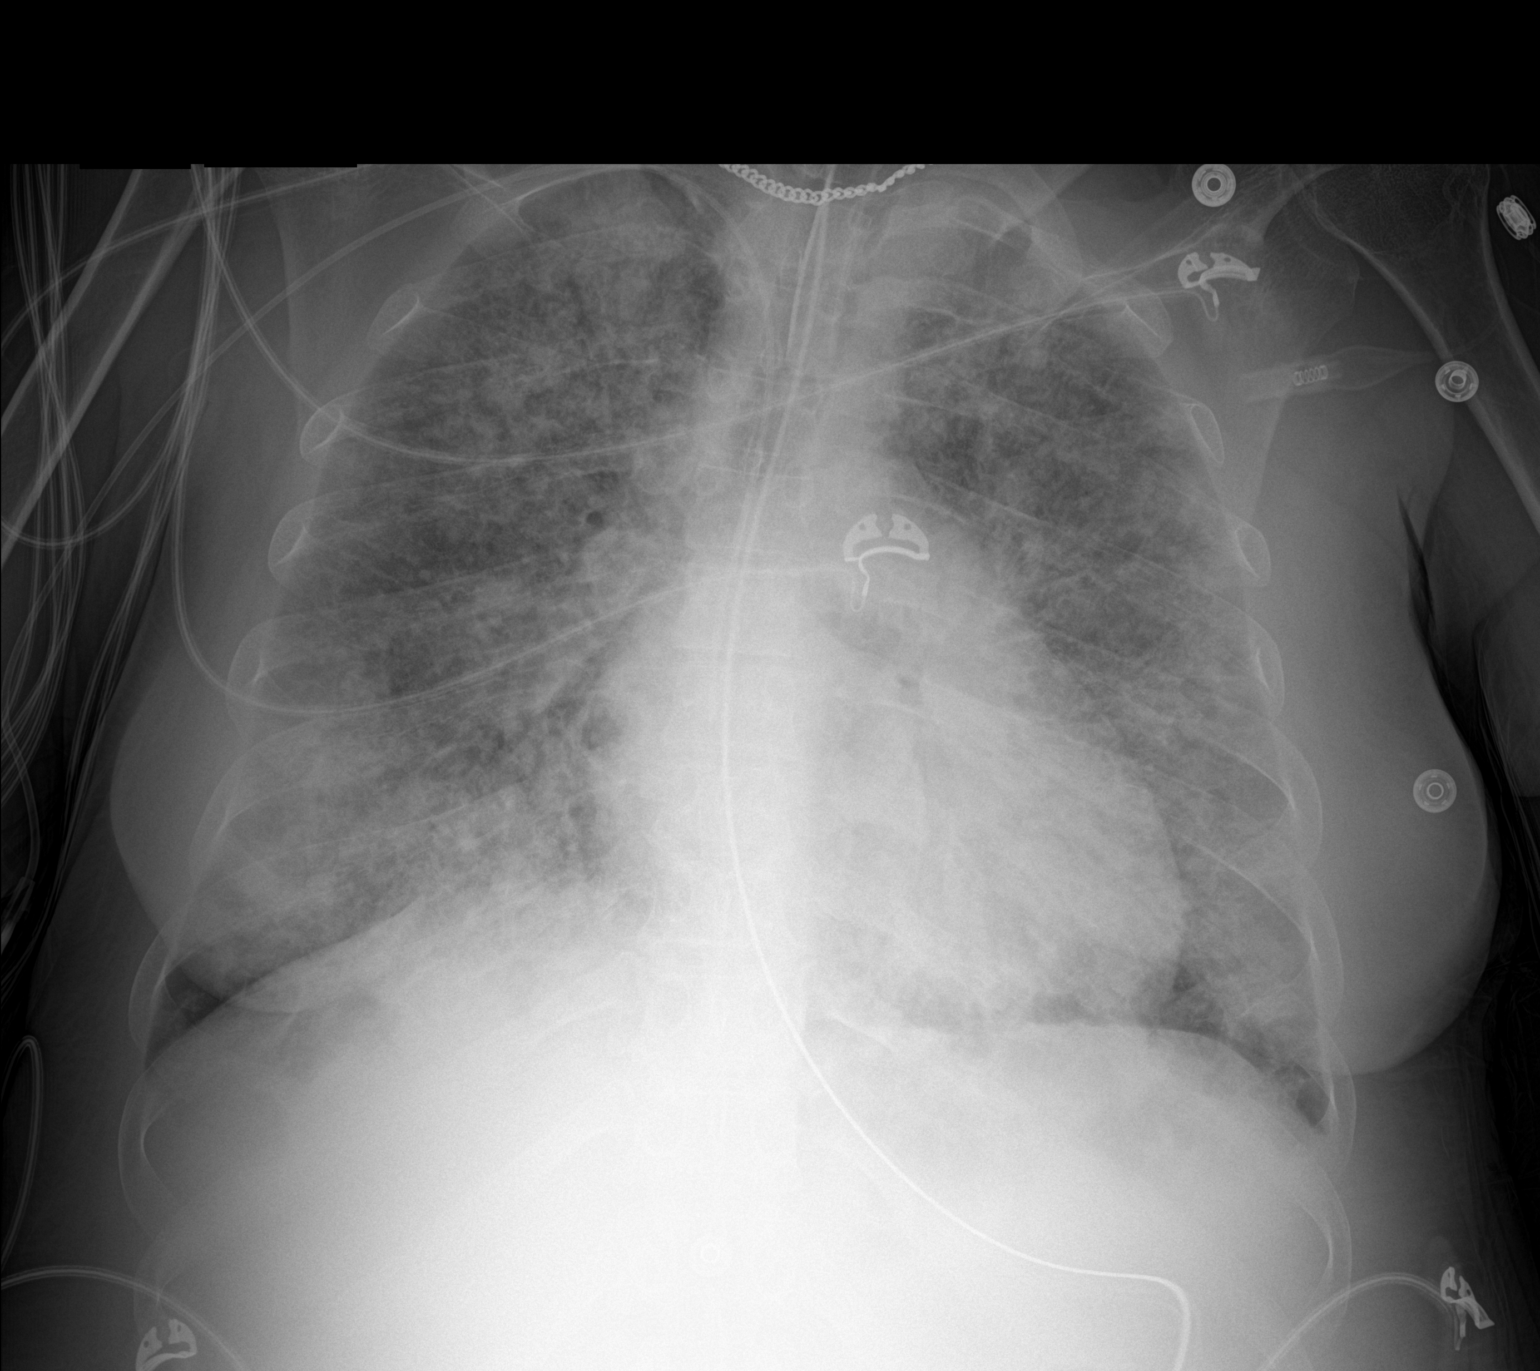

[1 of 1 positions shown; findings below may reference images not displayed]

FINDINGS: Endotracheal tube with tip between the clavicular heads and carina.
Endotracheal tube that at least reaches the stomach. Right PICC with
tip at the SVC.

Confluent bilateral airspace disease. Lung inflation is mildly
improved. Normal heart size and mediastinal contours. No visible
effusion or pneumothorax.
IMPRESSION: 1. Unremarkable hardware.
2. Severe airspace disease with mildly improved lung volumes.

## 2020-05-14 MED ORDER — PIPERACILLIN-TAZOBACTAM 3.375 G IVPB 30 MIN: 3.3750 g | Freq: Three times a day (TID) | INTRAVENOUS | Status: DC

## 2020-05-14 MED ORDER — ADULT MULTIVITAMIN W/MINERALS CH
1.0000 | ORAL_TABLET | Freq: Every day | ORAL | Status: DC
  Administered 2020-05-14 – 2020-05-15 (×2): 1
  Filled 2020-05-14 (×2): qty 1

## 2020-05-14 MED ORDER — PIPERACILLIN-TAZOBACTAM 3.375 G IVPB
3.3750 g | Freq: Three times a day (TID) | INTRAVENOUS | Status: DC
  Administered 2020-05-14 – 2020-05-16 (×6): 3.375 g via INTRAVENOUS
  Filled 2020-05-14 (×7): qty 50

## 2020-05-14 MED ORDER — VITAL AF 1.2 CAL PO LIQD
1000.0000 mL | ORAL | Status: DC
  Administered 2020-05-14: 1000 mL

## 2020-05-14 MED ORDER — HEPARIN SODIUM (PORCINE) 5000 UNIT/ML IJ SOLN
5000.0000 [IU] | Freq: Three times a day (TID) | INTRAMUSCULAR | Status: DC
  Administered 2020-05-14 – 2020-05-16 (×6): 5000 [IU] via SUBCUTANEOUS
  Filled 2020-05-14 (×6): qty 1

## 2020-05-14 NOTE — Progress Notes (Addendum)
Initial Nutrition Assessment  DOCUMENTATION CODES:   Severe malnutrition in context of social or environmental circumstances  INTERVENTION:   Initiate tube feeding via OG tube: Vital AF 1.2 at 60 ml/h (1440 ml per day)  Provides 1728 kcal (2058 kcal total with propofol), 108 gm protein, 1168 ml free water daily  MVI with minerals via tube daily.  Monitor magnesium, potassium, and phosphorus levels, MD to replete as needed, as pt is at risk for refeeding syndrome given severe PCM.   NUTRITION DIAGNOSIS:   Severe Malnutrition related to social / environmental circumstances (difficulty chewing due to poor dentition) as evidenced by mild fat depletion,mild muscle depletion.  GOAL:   Patient will meet greater than or equal to 90% of their needs  MONITOR:   Vent status,TF tolerance,Labs,Skin  REASON FOR ASSESSMENT:   Ventilator,Consult Enteral/tube feeding initiation and management  ASSESSMENT:   30 yo female admitted with suspected aspiration, sepsis r/t foot infection, ARDS. 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   Discussed patient in ICU rounds and with RN today. Received MD Consult for TF initiation and management. OG tube in place.  Spoke with patient's husband at bedside. Patient has recently experienced difficulty chewing due to missing teeth. The only meat she eats is meatballs. She has recently gained ~30 lbs from fluids. Usual weight ~145 lbs per husband. Patient was also able to answer a few questions, communicating by writing on her iPad. She says that she is lactose intolerant and has drank Boost Breeze or similar PO supplement in the past.   Patient is currently intubated on ventilator support MV: 11.2 L/min Temp (24hrs), Avg:99.3 F (37.4 C), Min:98.2 F (36.8 C), Max:100.9 F (38.3 C)  Propofol: 12.5 ml/hr providing 330 kcal from lipids.  Labs  reviewed. Phos 4.7, triglycerides 373 CBG: [id number]  Medications reviewed and include Colace, NPH insulin, protonix, propofol.  Weight history reviewed. 60 kg on 04/09/20, 71 kg on admission, currently 83.3 kg Increased weight related to edema.   NUTRITION - FOCUSED PHYSICAL EXAM:  Flowsheet Row Most Recent Value  Orbital Region Mild depletion  Upper Arm Region No depletion  Thoracic and Lumbar Region Mild depletion  Buccal Region Unable to assess  Temple Region Moderate depletion  Clavicle Bone Region Moderate depletion  Clavicle and Acromion Bone Region Moderate depletion  Scapular Bone Region Mild depletion  Dorsal Hand No depletion  Patellar Region No depletion  Anterior Thigh Region No depletion  Posterior Calf Region No depletion  Edema (RD Assessment) Moderate  Hair Reviewed  Eyes Reviewed  Mouth Unable to assess  Skin Reviewed  Nails Reviewed       Diet Order:   Diet Order            Diet NPO time specified Except for: Sips with Meds  Diet effective now                 EDUCATION NEEDS:   Not appropriate for education at this time  Skin:  Skin Assessment: Skin Integrity Issues: Skin Integrity Issues:: Other (Comment) Other: R foot osteomyelitis  Last BM:  2/14  Height:   Ht Readings from Last 1 Encounters:  05/12/20 5' 7"  (1.702 m)    Weight:   Wt Readings from Last 1 Encounters:  05/14/20 83.3 kg    Ideal Body Weight:  61.4 kg  BMI:  Body mass index is 28.76 kg/m.  Estimated Nutritional Needs:  Kcal:  2020  Protein:  100-120 gm  Fluid:  >/= 2 L    Lucas Mallow, RD, LDN, CNSC Please refer to Amion for contact information.

## 2020-05-14 NOTE — Progress Notes (Signed)
ID Pharmacy Note   Antibiotic change: START Piperacillin/tazobactam (Zosyn) 3.375g Q8H   Zosyn initiated for coverage of Pseudomonas aeroginosa for both aspiration pneumonia and diabetic foot ulcer.    Authorizing Provider: Dr. Montel Culver, MD    Adria Dill, PharmD-Candidate  05/15/2019 Infectious Diseases Consult Service

## 2020-05-14 NOTE — Progress Notes (Signed)
   05/14/20 0720  Airway 8 mm  Placement Date/Time: 05/13/20 1800   Airway Device: Endotracheal Tube  Size (mm): 8 mm  Secured at (cm): 26 cm  Secured at (cm) 26 cm  Measured From Lips  Secured Location Right  Secured By Actuary Repositioned Yes  Prone position No  Cuff Pressure (cm H2O) 30 cm H2O  Site Condition Dry  Adult Ventilator Settings  Vent Type Servo i  Humidity HME  Vent Mode PRVC  Vt Set 450 mL  Set Rate 20 bmp  FiO2 (%) 50 %  I Time 0.8 Sec(s)  PEEP 14 cmH20  Adult Ventilator Measurements  Peak Airway Pressure 32 L/min  Mean Airway Pressure 19 cmH20  Plateau Pressure 30 cmH20  Resp Rate Spontaneous 0 br/min  Resp Rate Total 20 br/min  Exhaled Vt 474 mL  Measured Ve 9.3 mL  I:E Ratio Measured 1:2.7  Auto PEEP 0 cmH20  Total PEEP 14 cmH20  SpO2 99 %  Adult Ventilator Alarms  Alarms On Y  Ve High Alarm 21 L/min  Ve Low Alarm 3 L/min  Resp Rate High Alarm 32 br/min  Resp Rate Low Alarm 5  PEEP Low Alarm 2 cmH2O  Press High Alarm 40 cmH2O  VAP Prevention  Cuff pressure (initial) 30 cm H2O  Daily Weaning Assessment  Daily Assessment of Readiness to Wean Wean protocol criteria not met  Reason not met Fi02 > 40%;PEEP > 8  Breath Sounds  Bilateral Breath Sounds Clear  Airway Suctioning/Secretions  Suction Type ETT  Suction Device  Catheter  Secretion Amount Small  Secretion Color Tan  Secretion Consistency Thick  Suction Tolerance Tolerated well  Suctioning Adverse Effects None

## 2020-05-14 NOTE — Progress Notes (Addendum)
[facility] Infectious Diseases Follow Up Note  Patient Identification: Patient Name: [name] MRN: [id number] [place] Date: 05/12/2020 12:22 AM Age: 30 y.o.Today's Date: 05/14/2020   Reason for Visit: Aspiration PNA, Rt foot wound   Principal Problem:   Chronic anemia Active Problems:   Hypertension associated with diabetes ([place])   Diabetes mellitus type 1 ([place])   Severe protein-calorie malnutrition ([place])   Osteomyelitis ([place])   GI bleeding   AKI (acute kidney injury) ([place])   Diarrhea   GI bleed   Respiratory failure (HCC)   Antibiotics:  Unasyn 2/12- c                     linezolid 2/13- c                     Daptomycin prior to admit - 2/12                     Ciprofloxacin prior to admit - 2/12   Lines/Tubes: picc+  Interval Events: Spiked fever up to101.8, leukopenia of 2.9. Intubated. BAL with 3% eosinophils     Assessment # Acute Hypoxic Respiratory Failure with h/o COVID infection with ARDS with concerns for TRALI/aspiration PNA. 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 Don's think the acute drop ( witjin a day)  is related to Linezolid.   # CKD # DM # PVD   Recommendations Continue Linezolid as is. Will need CBC to be monitored Discussed with ICU team, agree less likely to be a Daptomycin induced Eosinophilic PNA given BAL Eosinophils of 3%. No elevation in serum eosinophils.  Reasonable to switch from Unasyn to Zosyn  for now given critical illness while waiting for respiratory BAL cultures. Fu urine legionella antigen  Monitor CBC and BMP on IV antibiotics  Rest of the management as per the primary team. Thank you for the consult. Please page with pertinent questions or concerns.  ______________________________________________________________________ Subjective patient seen and examined at the bedside. Intubated. On sedation   ROS unavailable as patient is intubated   Vitals BP 114/87   Pulse 88   Temp 98.7 F (37.1 C) (Oral)   Resp 20   Ht _0  (1.702 m)   Wt 83.3 kg   SpO2 98%   BMI 28.76 kg/m     Physical Exam Constitutional:  average built female, intubated     Comments:   Cardiovascular:     Rate and Rhythm: Normal rate and regular rhythm.     Heart sounds: No murmur heard.   Pulmonary:     Effort: Coarse breath sounds     Comments:   Abdominal:     Palpations: Abdomen is soft.     Tenderness:   Musculoskeletal:        General: No swelling in the shoulders, kness, ankles, elbows and wrists bilaterally.  Rt 1st ray amputated site - black necrotic tissues, no erythema/swelling or warmth  Skin:    Comments: no obvious rashes   Neurological:     General: cannot assess currently  Psychiatric:        Mood and Affect: cannot assess   Pertinent Microbiology Results for orders placed or performed during the hospital encounter of 05/12/20  SARS CORONAVIRUS 2 (TAT 6-24 HRS) Nasopharyngeal Nasopharyngeal Swab     Status: None   Collection Time: 05/12/20  8:21 AM   Specimen: Nasopharyngeal Swab  Result Value Ref Range Status   SARS Coronavirus 2 NEGATIVE NEGATIVE Final    Comment: (NOTE) SARS-CoV-2 target nucleic acids are NOT DETECTED.  The SARS-CoV-2 RNA is generally detectable in upper and lower respiratory specimens during the acute phase of infection. Negative results do not preclude SARS-CoV-2 infection, do not rule out co-infections with other pathogens, and  should not be used as the sole basis for treatment or other patient management decisions. Negative results must be combined with clinical observations, patient history, and epidemiological information. The expected result is Negative.  Fact Sheet for Patients: SugarRoll.be  Fact Sheet for Healthcare Providers: https://www.woods-mathews.com/  This test is not yet approved or cleared by the [place] FDA and  has been authorized for detection and/or diagnosis of SARS-CoV-2 by FDA under an Emergency Use Authorization (EUA). This EUA will remain  in effect (meaning this test can be used) for the duration of the COVID-19 declaration under Se ction 564(b)(1) of the Act, 21 U.S.C. section 360bbb-3(b)(1), unless the authorization is terminated or revoked sooner.  Performed at [facility], [place] [address] [place], [place] [address]   C Difficile Quick Screen w PCR reflex     Status: None   Collection Time: 05/13/20  6:27 AM   Specimen: STOOL  Result Value Ref Range Status   C Diff antigen NEGATIVE NEGATIVE Final   C Diff toxin NEGATIVE NEGATIVE Final   C Diff interpretation No C. difficile detected.  Final    Comment: Performed at [facility], [place] [address], [place], [place] [address]  Respiratory (~20 pathogens) panel by PCR     Status: None   Collection Time: 05/13/20  3:45 PM   Specimen: Nasopharyngeal Swab; Respiratory  Result Value Ref Range Status   Adenovirus NOT DETECTED NOT DETECTED Final   Coronavirus 229E NOT DETECTED NOT DETECTED Final    Comment: (NOTE) The Coronavirus on the Respiratory Panel, DOES NOT test for the novel  Coronavirus (2019 nCoV)    Coronavirus HKU1 NOT DETECTED NOT DETECTED Final   Coronavirus NL63 NOT DETECTED NOT DETECTED Final   Coronavirus OC43 NOT DETECTED NOT DETECTED Final   Metapneumovirus NOT DETECTED NOT DETECTED Final   Rhinovirus / Enterovirus NOT DETECTED NOT DETECTED Final    Influenza A NOT DETECTED NOT DETECTED Final   Influenza B NOT DETECTED NOT DETECTED Final   Parainfluenza Virus 1 NOT DETECTED NOT DETECTED Final   Parainfluenza Virus 2 NOT DETECTED NOT DETECTED Final   Parainfluenza Virus 3 NOT DETECTED NOT DETECTED Final   Parainfluenza Virus 4 NOT DETECTED NOT DETECTED Final   Respiratory Syncytial Virus NOT DETECTED NOT DETECTED Final   Bordetella pertussis NOT DETECTED NOT DETECTED Final   Bordetella Parapertussis NOT DETECTED NOT DETECTED Final   Chlamydophila pneumoniae NOT DETECTED NOT DETECTED Final   Mycoplasma pneumoniae NOT DETECTED NOT DETECTED Final    Comment: Performed at [facility], [place]. [address], [place], [place] [address]  Culture, respiratory     Status: None (Preliminary result)  Collection Time: 05/13/20  6:04 PM   Specimen: Bronchoalveolar Lavage; Respiratory  Result Value Ref Range Status   Specimen Description BRONCHIAL ALVEOLAR LAVAGE  Final   Special Requests NONE  Final   Gram Stain   Final    MODERATE WBC PRESENT, PREDOMINANTLY PMN NO ORGANISMS SEEN    Culture   Final    NO GROWTH < 24 HOURS Performed at [facility], [address], [place], [place] [address]    Report Status PENDING  Incomplete     Pertinent Lab. CBC Latest Ref Rng & Units 05/14/2020 05/13/2020 05/13/2020  WBC 4.0 - 10.5 K/uL 2.9(L) - 7.0  Hemoglobin 12.0 - 15.0 g/dL 8.9(L) 9.5(L) 9.6(L)  Hematocrit 36.0 - 46.0 % 26.6(L) 28.0(L) 28.4(L)  Platelets 150 - 400 K/uL 135(L) - 151   CMP Latest Ref Rng & Units 05/14/2020 05/13/2020 05/13/2020  Glucose 70 - 99 mg/dL 139(H) - 158(H)  BUN 6 - 20 mg/dL 28(H) - 27(H)  Creatinine 0.44 - 1.00 mg/dL 1.89(H) - 1.90(H)  Sodium 135 - 145 mmol/L 136 137 137  Potassium 3.5 - 5.1 mmol/L 3.6 3.9 3.8  Chloride 98 - 111 mmol/L 100 - 101  CO2 22 - 32 mmol/L 24 - 24  Calcium 8.9 - 10.3 mg/dL 7.7(L) - 8.0(L)  Total Protein 6.5 - 8.1 g/dL 5.5(L) - 5.9(L)  Total Bilirubin 0.3 - 1.2 mg/dL 1.8(H) - 2.1(H)   Alkaline Phos 38 - 126 U/L 134(H) - 192(H)  AST 15 - 41 U/L 78(H) - 162(H)  ALT 0 - 44 U/L 77(H) - 103(H)     Pertinent Imaging today Plain films and CT images have been personally visualized and interpreted; radiology reports have been reviewed. Decision making incorporated into the Impression / Recommendations.  I have spent approx 30 minutes for this patient encounter including review of prior medical records with greater than 50% of time being face to face and coordination of their care.  Electronically signed by:   [name], MD Infectious Disease Physician [facility] Pager: [phone]

## 2020-05-14 NOTE — Progress Notes (Signed)
NAME:  [name], MRN:  [id number], DOB:  1990-11-03, LOS: 2 ADMISSION DATE:  05/12/2020, CONSULTATION DATE:  05/13/20 REFERRING MD:  [place], CHIEF COMPLAINT:  Hypoxia   Brief History   [name] is a 30 yo female with a pPMH of T1DM, COVID 3x, HTN, CKD (Stage 3), and osteomyelitis s/p Rt ray amputation by Dr. Lajoyce Corners (04/13/20). Subsequently went to [facility] with pain in right foot where she underwent I&D and VAC placement and transferred to [facility]. Clinical course complicated by increased O2 demand on BIPAP resulting in intubation. CXR showed  extensive bilateral airspace disease 2/2 to aspiration vs infection vs medication side effect now s/p bronchoscopy.   Past Medical History   . Allergy   . Anemia   . Anxiety   . Blood transfusion without reported diagnosis    Dec 2018  . Cataract    right eye  . COVID-19 03/2020  . Depression   . Diabetes type 1, uncontrolled (HCC) 11/14/2011   Since age 50  . Fibromyalgia   . Gastroparesis   . GERD (gastroesophageal reflux disease)   . Hypertension   . Infection    UTI April 2016     Significant Hospital Events   05/13/2020 transferred to intensive care unit 05/13/2020 Bronchoscopy  Consults:  PCCM, ID, Ortho  Procedures:  02/13 Intubated   Significant Diagnostic Tests:  02/13 CXR: The enteric tube projects over the gastric body.  02/14 CXR: 1. Unremarkable hardware. 2. Severe airspace disease with mildly improved lung volumes.   2/13 CXR: 1. Slight interval worsening of the bilateral hazy airspace opacities. 2. Support lines and tubes as above.  1. Significantly worsened diffuse bilateral airspace disease may reflect pneumonia, edema, or ARDS. 2. Unchanged borderline cardiomegaly.   02/13 ECHOCARDIOGRAM COMPLETE 1. Left ventricular ejection fraction, by estimation, is 60 to 65%. The left ventricle has normal function. The left ventricle has no regional wall motion abnormalities. There is mild  concentric left ventricular hypertrophy. Left ventricular diastolic parameters are indeterminate.   2. Right ventricular systolic function is normal. The right ventricular size is normal. There is moderately elevated pulmonary artery systolic pressure.   3. The mitral valve is normal in structure. Trivial mitral valve regurgitation.  4. Tricuspid valve regurgitation is mild to moderate.   5. The aortic valve is normal in structure. Aortic valve regurgitation is not visualized. No aortic stenosis is present.   6. The inferior vena cava is normal in size with greater than 50% respiratory variability, suggesting right atrial pressure of 3 mmHg.   Micro Data:  05/12/2020 blood cultures x2 05/12/2020 Covid negative 05/13/18 [place] Alveolar Lavage - moderate WBC mostly PMN, no organisms   Antimicrobials:  05/12/2020 daptomycin 05/12/2020 Unasyn  Previous surgical cultures positive for MRSA.  Interim history/subjective:    Overnight, patient had a fever as high as 101.85F and blood pressure has been soft with SBP in the high 90's.    On evaluation today, patient is resting in bed with ET tube in place. She is sedated with some thick secretions in her ET tube.   Objective   Blood pressure 104/61, pulse 88, temperature 98.2 F (36.8 C), resp. rate 20, height 5\' 7"  (1.702 m), weight 71 kg, SpO2 97 %.    Vent Mode: PRVC FiO2 (%):  [70 %-100 %] 70 % Set Rate:  [20 bmp] 20 bmp Vt Set:  [450 mL] 450 mL PEEP:  [14 cmH20] 14 cmH20 Plateau Pressure:  [29 cmH20-32 cmH20]  30 cmH20   Intake/Output Summary (Last 24 hours) at 05/14/2020 0540 Last data filed at 05/13/2020 2100 Gross per 24 hour  Intake 1272.23 ml  Output 800 ml  Net 472.23 ml   Filed Weights   05/12/20 0918 05/12/20 1035  Weight: 71.2 kg 71 kg    Examination: General: ET tube in place, sedated HENT: pupils are round and reactive  Lungs: decreased breath sounds throughout Cardiovascular: RRR no murmurs rubs or gallops Abdomen:  soft BS+ Extremities: Right chronic shin wound and recent ray amputation with significant edema Neuro: sedated but CN grossly intact.    Resolved Hospital Problem list     Assessment & Plan:   Acute hypoxic respiratory failure, Aspiration vs medication SE vs CAP Hx of COVID-10 pneumonia, 04/05/2020, 09/24/2019, 05/21/2019 Bilateral airspace opacities, suspect ARDS. Patient admitted to ICU yesterday with hypoxic respiratory failure and started on broad spectrum AB. Patient's WBC is WNL, Procal is elevated at 10.74 on arrival, Respiratory PCR negative, Strep pneumo urine Ag negative, Resp and Blood cultures are pending. Patient has positive COVID-19 PCR today. Likely CAP vs Aspiration as bronch did not show significant eosinophilia.  - Cont Broad spectrum AB with daptomycin and unasyn. Considering adding additional coverage for pseudomonas. AB per ID - Cont LTV ventilation - On PPI for GI protection.  - VAP protocol - SAT/SBT when possible - PAD protocol with a RAS goal of -1  - Continue to trend D-dimer, fibrinogen, and CRP  Type I diabetic CBG is 125 this morning on SSI.  - Cont SSI. 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 Lajoyce Corners) Subsequent I&D at [facility] with wound [facility] application Orthopedic surgery consulted for further evaluation and management. On broad spectrum AB.  - Continue antibiotics.   Acute on CKD (Stage 3) Proteinuria:  Patient has an acute elevation in Cr to 1.9 (GFR of 36) from her baseline of 1.3 (GFR of 54) in the setting of pneumonia. Likely pre-renal azotemia. Patient's underlying kidney disease appears 2/2 to poorly controlled diabetic nephropathy.  - Avoid nephrotoxic medication when possible.  - No emergent needs for RRT - Will continue to monitor daily.   Protein caloric malnutrition: Albumin of 1.2 today. Will start tube feeds today and slowly titrate up as tolerated.  - TF start today.   Best  practice (evaluated daily)  Diet: Start TF today Pain/Anxiety/Delirium protocol (if indicated): propofol and fentynal VAP protocol (if indicated): appropriate DVT prophylaxis: Subq heparin  GI prophylaxis: ppi Glucose control: ssi Mobility: bedrest Disposition: icu  Goals of Care:  Last date of multidisciplinary goals of care discussion: 05/14/20 Family and staff present: patient s partner was at bed side. Discussed clinical situation with im today.  Summary of discussion: discuss current treatment plan Follow up goals of care discussion due: end of the week  Code Status: full   Labs   CBC: Recent Labs  Lab 05/12/20 0041 05/12/20 0935 05/12/20 1904 05/13/20 0530 05/13/20 2010  WBC 8.7 6.2  --  7.0  --   NEUTROABS 7.2  --   --  6.0  --   HGB 3.7* 9.9* 9.2* 9.6* 9.5*  HCT 12.3* 31.1* 27.0* 28.4* 28.0*  MCV 93.9 91.5  --  88.2  --   PLT 147* 154  --  151  --     Basic Metabolic Panel: Recent Labs  Lab 05/12/20 0041 05/12/20 0935 05/12/20 1904 05/13/20 0530 05/13/20 2010  NA 135  --  135 137 137  K 3.9  --  3.8 3.8 3.9  CL 104  --   --  101  --   CO2 21*  --   --  24  --   GLUCOSE 270*  --   --  158*  --   BUN 27*  --   --  27*  --   CREATININE 1.78* 1.90*  --  1.90*  --   CALCIUM 7.9*  --   --  8.0*  --   MG  --   --   --  1.5*  --    GFR: Estimated Creatinine Clearance: 42.1 mL/min (A) (by C-G formula based on SCr of 1.9 mg/dL (H)). Recent Labs  Lab 05/12/20 0041 05/12/20 0935 05/13/20 0530  PROCALCITON  --   --  10.74  WBC 8.7 6.2 7.0    Liver Function Tests: Recent Labs  Lab 05/12/20 0041 05/13/20 0530  AST 100* 162*  ALT 85* 103*  ALKPHOS 120 192*  BILITOT 0.5 2.1*  PROT 5.8* 5.9*  ALBUMIN 1.6* 1.4*   No results for input(s): LIPASE, AMYLASE in the last 168 hours. No results for input(s): AMMONIA in the last 168 hours.  ABG    Component Value Date/Time   PHART 7.418 05/13/2020 2010   PCO2ART 43.7 05/13/2020 2010   PO2ART 147 (H)  05/13/2020 2010   HCO3 28.1 (H) 05/13/2020 2010   TCO2 29 05/13/2020 2010   ACIDBASEDEF 1.9 05/13/2020 0825   O2SAT 99.0 05/13/2020 2010     Coagulation Profile: No results for input(s): INR, PROTIME in the last 168 hours.  Cardiac Enzymes: No results for input(s): CKTOTAL, CKMB, CKMBINDEX, TROPONINI in the last 168 hours.  HbA1C: Hemoglobin A1C  Date/Time Value Ref Range Status  02/29/2020 03:58 PM 9.0 (A) 4.0 - 5.6 % Final   Hgb A1c MFr Bld  Date/Time Value Ref Range Status  04/10/2020 11:22 AM 8.6 (H) 4.8 - 5.6 % Final    Comment:    (NOTE) Pre diabetes:          5.7%-6.4%  Diabetes:              >6.4%  Glycemic control for   <7.0% adults with diabetes   09/25/2019 04:38 AM 8.3 (H) 4.8 - 5.6 % Final    Comment:    (NOTE) Pre diabetes:          5.7%-6.4%  Diabetes:              >6.4%  Glycemic control for   <7.0% adults with diabetes     CBG: Recent Labs  Lab 05/13/20 1222 05/13/20 1623 05/13/20 1950 05/14/20 0027 05/14/20 0420  GLUCAP 136* 144* 107* 148* 136*    Critical care time:      Chari Manning, D.O.  Internal Medicine Resident, PGY-2 [facility]  Pager: [phone] 5:40 AM, 05/14/2020

## 2020-05-14 NOTE — Progress Notes (Signed)
Hydralazine held during shift per MD due to blood pressures maintaining on sedation drips/risk of hypotension.

## 2020-05-14 NOTE — Progress Notes (Signed)
[place] Progress Note Patient Name: [name] DOB: [birth year] MRN: [id number]   Date of Service  05/14/2020  HPI/Events of Note  Tele monitor showing ST elevation. Leads changed.  EKG Looks fine.   eICU Interventions  Watch for now.  Discussed with bed side RN.       Intervention Category Intermediate Interventions: Other:  Elmer Sow 05/14/2020, 1:09 AM

## 2020-05-14 NOTE — Progress Notes (Signed)
eLink Physician-Brief Progress Note Patient Name: [name] DOB: [birth year] MRN: [id number]   Date of Service  05/14/2020  HPI/Events of Note  D dimer > 10.  suspected TRALI from massive transfusion for Hg 3.7. Hg stable > 8.5 now. Leukopenia.   eICU Interventions  Would defer any Anti coagulation for now. AM team to decide further work up on this.   Discussed with bed side RN.      Intervention Category Intermediate Interventions: Diagnostic test evaluation  Elmer Sow 05/14/2020, 6:39 AM

## 2020-05-14 NOTE — Progress Notes (Signed)
CRITICAL VALUE ALERT  Critical Value:  D-dimer 10.55, WBC 2.9  Date & Time Notied:  0640  Provider Notified: Dr. Prudencio Burly  Orders Received/Actions taken: No new orders.   Lab has attempted collection of blood cultures. No success at this time.

## 2020-05-15 DIAGNOSIS — N179 Acute kidney failure, unspecified: Secondary | ICD-10-CM | POA: Diagnosis not present

## 2020-05-15 DIAGNOSIS — K922 Gastrointestinal hemorrhage, unspecified: Secondary | ICD-10-CM | POA: Diagnosis not present

## 2020-05-15 DIAGNOSIS — D649 Anemia, unspecified: Secondary | ICD-10-CM | POA: Diagnosis not present

## 2020-05-15 LAB — ACID FAST SMEAR (AFB, MYCOBACTERIA): Acid Fast Smear: NEGATIVE

## 2020-05-15 LAB — GLUCOSE, CAPILLARY
Glucose-Capillary: 201 mg/dL — ABNORMAL HIGH (ref 70–99)
Glucose-Capillary: 186 mg/dL — ABNORMAL HIGH (ref 70–99)
Glucose-Capillary: 185 mg/dL — ABNORMAL HIGH (ref 70–99)
Glucose-Capillary: 191 mg/dL — ABNORMAL HIGH (ref 70–99)
Glucose-Capillary: 165 mg/dL — ABNORMAL HIGH (ref 70–99)
Glucose-Capillary: 299 mg/dL — ABNORMAL HIGH (ref 70–99)
Glucose-Capillary: 154 mg/dL — ABNORMAL HIGH (ref 70–99)
Glucose-Capillary: 132 mg/dL — ABNORMAL HIGH (ref 70–99)

## 2020-05-15 LAB — LEGIONELLA PNEUMOPHILA SEROGP 1 UR AG: L. pneumophila Serogp 1 Ur Ag: NEGATIVE

## 2020-05-15 LAB — BASIC METABOLIC PANEL
Anion gap: 13 (ref 5–15)
BUN: 35 mg/dL — ABNORMAL HIGH (ref 6–20)
CO2: 22 mmol/L (ref 22–32)
Calcium: 7.8 mg/dL — ABNORMAL LOW (ref 8.9–10.3)
Chloride: 102 mmol/L (ref 98–111)
Creatinine, Ser: 2.59 mg/dL — ABNORMAL HIGH (ref 0.44–1.00)
GFR, Estimated: 25 mL/min — ABNORMAL LOW (ref >60)
Glucose, Bld: 204 mg/dL — ABNORMAL HIGH (ref 70–99)
Potassium: 4 mmol/L (ref 3.5–5.1)
Sodium: 137 mmol/L (ref 135–145)

## 2020-05-15 LAB — PHOSPHORUS: Phosphorus: 5.3 mg/dL — ABNORMAL HIGH (ref 2.5–4.6)

## 2020-05-15 LAB — CBC
HCT: 27.6 % — ABNORMAL LOW (ref 36.0–46.0)
Hemoglobin: 8.6 g/dL — ABNORMAL LOW (ref 12.0–15.0)
MCH: 28.4 pg (ref 26.0–34.0)
MCHC: 31.2 g/dL (ref 30.0–36.0)
MCV: 91.1 fL (ref 80.0–100.0)
Platelets: 185 10*3/uL (ref 150–400)
RBC: 3.03 MIL/uL — ABNORMAL LOW (ref 3.87–5.11)
RDW: 14.9 % (ref 11.5–15.5)
WBC: 7 10*3/uL (ref 4.0–10.5)
nRBC: 0 % (ref 0.0–0.2)

## 2020-05-15 LAB — TRIGLYCERIDES: Triglycerides: 197 mg/dL — ABNORMAL HIGH (ref <150)

## 2020-05-15 LAB — MAGNESIUM: Magnesium: 1.9 mg/dL (ref 1.7–2.4)

## 2020-05-15 LAB — ANTINUCLEAR ANTIBODIES, IFA: ANA Ab, IFA: NEGATIVE

## 2020-05-15 LAB — GLOMERULAR BASEMENT MEMBRANE ANTIBODIES: GBM Ab: 3 units (ref 0–20)

## 2020-05-15 MED ORDER — PANTOPRAZOLE SODIUM 40 MG PO TBEC
40.0000 mg | DELAYED_RELEASE_TABLET | Freq: Every day | ORAL | Status: DC
  Administered 2020-05-16: 40 mg via ORAL
  Filled 2020-05-15 (×3): qty 1

## 2020-05-15 MED ORDER — HYDRALAZINE HCL 50 MG PO TABS
50.0000 mg | ORAL_TABLET | Freq: Four times a day (QID) | ORAL | Status: DC
  Filled 2020-05-15: qty 1

## 2020-05-15 MED ORDER — NYSTATIN 100000 UNIT/ML MT SUSP
5.0000 mL | Freq: Four times a day (QID) | OROMUCOSAL | Status: DC
  Administered 2020-05-15: 500000 [IU] via ORAL
  Filled 2020-05-15 (×2): qty 5

## 2020-05-15 MED ORDER — ONDANSETRON HCL 4 MG/2ML IJ SOLN: 4.0000 mg | Freq: Four times a day (QID) | INTRAMUSCULAR | Status: DC | PRN

## 2020-05-15 MED ORDER — FENTANYL CITRATE (PF) 100 MCG/2ML IJ SOLN
25.0000 ug | INTRAMUSCULAR | Status: DC | PRN
  Administered 2020-05-15: 50 ug via INTRAVENOUS
  Administered 2020-05-15 (×2): 25 ug via INTRAVENOUS
  Administered 2020-05-17: 100 ug via INTRAVENOUS
  Filled 2020-05-15 (×3): qty 2

## 2020-05-15 MED ORDER — ONDANSETRON HCL 4 MG PO TABS: 4.0000 mg | ORAL_TABLET | Freq: Four times a day (QID) | ORAL | Status: DC | PRN

## 2020-05-15 MED ORDER — OXYCODONE-ACETAMINOPHEN 5-325 MG PO TABS: 1.0000 | ORAL_TABLET | ORAL | Status: DC | PRN

## 2020-05-15 MED ORDER — PANTOPRAZOLE SODIUM 40 MG PO PACK: 40.0000 mg | PACK | Freq: Every day | ORAL | Status: DC

## 2020-05-15 MED ORDER — ACETAMINOPHEN 500 MG PO TABS
500.0000 mg | ORAL_TABLET | Freq: Four times a day (QID) | ORAL | Status: DC | PRN
  Filled 2020-05-15: qty 1

## 2020-05-15 MED ORDER — GABAPENTIN 300 MG PO CAPS
300.0000 mg | ORAL_CAPSULE | Freq: Every day | ORAL | Status: DC
  Filled 2020-05-15: qty 1

## 2020-05-15 MED ORDER — GABAPENTIN 250 MG/5ML PO SOLN
300.0000 mg | Freq: Every day | ORAL | Status: DC
  Filled 2020-05-15: qty 6

## 2020-05-15 MED ORDER — CARVEDILOL 12.5 MG PO TABS
12.5000 mg | ORAL_TABLET | Freq: Two times a day (BID) | ORAL | Status: DC
  Administered 2020-05-15: 12.5 mg
  Filled 2020-05-15: qty 1

## 2020-05-15 MED ORDER — ACETAMINOPHEN 160 MG/5ML PO SOLN: 500.0000 mg | Freq: Four times a day (QID) | ORAL | Status: DC | PRN

## 2020-05-15 MED ORDER — OXYCODONE-ACETAMINOPHEN 5-325 MG PO TABS
1.0000 | ORAL_TABLET | ORAL | Status: DC | PRN
  Administered 2020-05-16: 2 via ORAL
  Filled 2020-05-15: qty 2

## 2020-05-15 MED ORDER — INSULIN ASPART 100 UNIT/ML ~~LOC~~ SOLN
0.0000 [IU] | SUBCUTANEOUS | Status: DC
  Administered 2020-05-15 (×3): 1 [IU] via SUBCUTANEOUS

## 2020-05-15 MED ORDER — ADULT MULTIVITAMIN W/MINERALS CH
1.0000 | ORAL_TABLET | Freq: Every day | ORAL | Status: DC
  Administered 2020-05-16: 1 via ORAL
  Filled 2020-05-15: qty 1

## 2020-05-15 MED ORDER — FUROSEMIDE 10 MG/ML IJ SOLN
80.0000 mg | Freq: Three times a day (TID) | INTRAMUSCULAR | Status: DC
  Administered 2020-05-15 – 2020-05-19 (×11): 80 mg via INTRAVENOUS
  Filled 2020-05-15 (×11): qty 8

## 2020-05-15 MED ORDER — INSULIN DETEMIR 100 UNIT/ML ~~LOC~~ SOLN
4.0000 [IU] | Freq: Two times a day (BID) | SUBCUTANEOUS | Status: DC
  Administered 2020-05-15 (×2): 4 [IU] via SUBCUTANEOUS
  Filled 2020-05-15 (×3): qty 0.04

## 2020-05-15 MED ORDER — LACTATED RINGERS IV SOLN
INTRAVENOUS | Status: DC
  Administered 2020-05-15: 75 mL/h via INTRAVENOUS

## 2020-05-15 MED ORDER — CARVEDILOL 12.5 MG PO TABS
12.5000 mg | ORAL_TABLET | Freq: Two times a day (BID) | ORAL | Status: DC
  Administered 2020-05-16: 12.5 mg via ORAL
  Filled 2020-05-15: qty 1

## 2020-05-15 NOTE — Progress Notes (Signed)
Placed pt. On bipap per MD order.

## 2020-05-15 NOTE — Progress Notes (Addendum)
I have seen and examined the patient. I have personally reviewed the clinical findings, laboratory findings, microbiological data and imaging studies. The assessment and treatment plan was discussed with the  Advance Practice Provider, Mauricio Po  I agree with her/his recommendations except following additions/corrections.  Fever curve trending down, no leukocytosis, awake and alert today with improving oxygenation Blood cultures and BAL cultures NGTD  She wrote in a white board that she passed out while she had blue jello in her mouth and she woke up in the ED with cough. Highly suspicious for aspiration  Will plan to de-escalate zosyn to Unasyn if cultures continues to be no growth Continue Linezolid for now although MRSA nares is negative pending surgical intervention by Dr [name] for Rt foot Monitor CBC and BMP ( Cr)  on IV antibiotics  [name], MD [place] for [facility] [place] for Infectious Disease  Date of Admission:  05/12/2020     Total days of antibiotics 4         ASSESSMENT:  Ms. Will's temperature curve appears to be improving and she is alert and writing messages today. History supports the likelihood of aspiration pneumonia. Remains on full ventilatory support. Continue Zyvox and Zosyn with anticipated 5-7 day course of treatment following defervescence and pending culture results which remain pending. Continue wound care and surgical interventions of right foot per Dr. [name].   PLAN:  1. Continue Zyvox and Zosyn. 2. Monitor respiratory cultures  3. Ventilatory management per CCM 4. Right foot wound care per Dr. [name]   Principal Problem:   Chronic anemia Active Problems:   Hypertension associated with diabetes ([place])   Diabetes mellitus type 1 ([place])   Severe protein-calorie malnutrition ([place])   Osteomyelitis ([place])   GI bleeding   AKI (acute kidney injury) ([place])   Diarrhea   GI bleed    Respiratory failure ([place])   . carvedilol  12.5 mg Per Tube BID WC  . chlorhexidine gluconate (MEDLINE KIT)  15 mL Mouth Rinse BID  . Chlorhexidine Gluconate Cloth  6 each Topical Daily  . docusate  100 mg Per Tube BID  . gabapentin  300 mg Per Tube QHS  . heparin injection (subcutaneous)  5,000 Units Subcutaneous Q8H  . insulin NPH Human  20 Units Subcutaneous QAC breakfast  . ipratropium-albuterol  3 mL Nebulization Q4H  . mouth rinse  15 mL Mouth Rinse 10 times per day  . multivitamin with minerals  1 tablet Per Tube Daily  . [START ON 05/16/2020] pantoprazole sodium  40 mg Per Tube Daily  . polyethylene glycol  17 g Per Tube Daily  . rocuronium  1 mg/kg Intravenous Once  . sodium chloride flush  10-40 mL Intracatheter Q12H    SUBJECTIVE:  Max temperature of 100.9 in the last 24 hours. Alert on ventilator writing messages. Does not recall having any fevers or cough recently. Passed out with blue jello in her mouth and woke up in the ED coughing. Has had drainage with her right foot and previously had wound vac in place.   Allergies  Allergen Reactions  . Other Anaphylaxis    Reaction to [place] nuts   . Lactose Intolerance (Gi) Diarrhea     Review of Systems: Review of Systems  Constitutional: Negative for chills, fever and weight loss.  Respiratory: Negative for cough, shortness of breath and wheezing.   Cardiovascular: Negative for chest pain and leg swelling.  Gastrointestinal: Negative for abdominal pain, constipation, diarrhea, nausea and vomiting.  Musculoskeletal:       Positive for right foot pain.   Skin: Negative for rash.      OBJECTIVE: Vitals:   05/15/20 0630 05/15/20 0700 05/15/20 0713 05/15/20 0728  BP: 117/71 118/82    Pulse: 83 86    Resp: 20 20    Temp:   98 F (36.7 C)   TempSrc:   Axillary   SpO2: 97% 100%  97%  Weight:      Height:       Body mass index is 28.35 kg/m.  Physical Exam Constitutional:      General: She is not in acute  distress.    Appearance: She is well-developed and well-nourished.     Comments: Lying in bed with head of bed elevated; intubated; writing on dry erase board  Cardiovascular:     Rate and Rhythm: Normal rate and regular rhythm.     Pulses: Intact distal pulses.     Heart sounds: Normal heart sounds.  Pulmonary:     Effort: Pulmonary effort is normal.     Breath sounds: Normal breath sounds.  Skin:    General: Skin is warm and dry.  Neurological:     Mental Status: She is alert and oriented to person, place, and time.  Psychiatric:        Mood and Affect: Mood and affect normal.        Behavior: Behavior normal.        Thought Content: Thought content normal.        Judgment: Judgment normal.     Lab Results Lab Results  Component Value Date   WBC 7.0 05/15/2020   HGB 8.6 (L) 05/15/2020   HCT 27.6 (L) 05/15/2020   MCV 91.1 05/15/2020   PLT 185 05/15/2020    Lab Results  Component Value Date   CREATININE 2.59 (H) 05/15/2020   BUN 35 (H) 05/15/2020   NA 137 05/15/2020   K 4.0 05/15/2020   CL 102 05/15/2020   CO2 22 05/15/2020    Lab Results  Component Value Date   ALT 77 (H) 05/14/2020   AST 78 (H) 05/14/2020   ALKPHOS 134 (H) 05/14/2020   BILITOT 1.8 (H) 05/14/2020     Microbiology: Recent Results (from the past 240 hour(s))  SARS CORONAVIRUS 2 (TAT 6-24 HRS) Nasopharyngeal Nasopharyngeal Swab     Status: None   Collection Time: 05/12/20  8:21 AM   Specimen: Nasopharyngeal Swab  Result Value Ref Range Status   SARS Coronavirus 2 NEGATIVE NEGATIVE Final    Comment: (NOTE) SARS-CoV-2 target nucleic acids are NOT DETECTED.  The SARS-CoV-2 RNA is generally detectable in upper and lower respiratory specimens during the acute phase of infection. Negative results do not preclude SARS-CoV-2 infection, do not rule out co-infections with other pathogens, and should not be used as the sole basis for treatment or other patient management decisions. Negative results  must be combined with clinical observations, patient history, and epidemiological information. The expected result is Negative.  Fact Sheet for Patients: SugarRoll.be  Fact Sheet for Healthcare Providers: https://www.woods-mathews.com/  This test is not yet approved or cleared by the [place] FDA and  has been authorized for detection and/or diagnosis of SARS-CoV-2 by FDA under an Emergency Use Authorization (EUA). This EUA will remain  in effect (meaning this test can be used) for the duration of the COVID-19 declaration under Se ction 564(b)(1) of the Act,  21 U.S.C. section 360bbb-3(b)(1), unless the authorization is terminated or revoked sooner.  Performed at [facility], [place] [address], [place], [place] [address]   C Difficile Quick Screen w PCR reflex     Status: None   Collection Time: 05/13/20  6:27 AM   Specimen: STOOL  Result Value Ref Range Status   C Diff antigen NEGATIVE NEGATIVE Final   C Diff toxin NEGATIVE NEGATIVE Final   C Diff interpretation No C. difficile detected.  Final    Comment: Performed at [facility], [place] [address], [place], [place] [address]  Respiratory (~20 pathogens) panel by PCR     Status: None   Collection Time: 05/13/20  3:45 PM   Specimen: Nasopharyngeal Swab; Respiratory  Result Value Ref Range Status   Adenovirus NOT DETECTED NOT DETECTED Final   Coronavirus 229E NOT DETECTED NOT DETECTED Final    Comment: (NOTE) The Coronavirus on the Respiratory Panel, DOES NOT test for the novel  Coronavirus (2019 nCoV)    Coronavirus HKU1 NOT DETECTED NOT DETECTED Final   Coronavirus NL63 NOT DETECTED NOT DETECTED Final   Coronavirus OC43 NOT DETECTED NOT DETECTED Final   Metapneumovirus NOT DETECTED NOT DETECTED Final   Rhinovirus / Enterovirus NOT DETECTED NOT DETECTED Final   Influenza A NOT DETECTED NOT DETECTED Final   Influenza B NOT DETECTED NOT DETECTED Final   Parainfluenza  Virus 1 NOT DETECTED NOT DETECTED Final   Parainfluenza Virus 2 NOT DETECTED NOT DETECTED Final   Parainfluenza Virus 3 NOT DETECTED NOT DETECTED Final   Parainfluenza Virus 4 NOT DETECTED NOT DETECTED Final   Respiratory Syncytial Virus NOT DETECTED NOT DETECTED Final   Bordetella pertussis NOT DETECTED NOT DETECTED Final   Bordetella Parapertussis NOT DETECTED NOT DETECTED Final   Chlamydophila pneumoniae NOT DETECTED NOT DETECTED Final   Mycoplasma pneumoniae NOT DETECTED NOT DETECTED Final    Comment: Performed at [facility], [place]. [address], [place], [place] [address]  Culture, respiratory     Status: None (Preliminary result)   Collection Time: 05/13/20  6:04 PM   Specimen: Bronchoalveolar Lavage; Respiratory  Result Value Ref Range Status   Specimen Description BRONCHIAL ALVEOLAR LAVAGE  Final   Special Requests NONE  Final   Gram Stain   Final    MODERATE WBC PRESENT, PREDOMINANTLY PMN NO ORGANISMS SEEN    Culture   Final    NO GROWTH 2 DAYS Performed at [facility], [address] [place], [place] [address]    Report Status PENDING  Incomplete     Terri Piedra, [place] for Infectious Disease [facility]  05/15/2020  9:11 AM

## 2020-05-15 NOTE — Progress Notes (Signed)
eLink Physician-Brief Progress Note Patient Name: [name] DOB: [birth year] MRN: [id number]   Date of Service  05/15/2020  HPI/Events of Note  Patient on [place] at 35 L/min with sats in low 90's while awake, however, sats drop into low 80's-high 70's which asleep. CVP = 23. Creatinine = 2.59  eICU Interventions  Plan: 1. Trial of BiPAP. 2. Lasix 80 mg IV now and Q 8 hours.      Intervention Category Major Interventions: Hypoxemia - evaluation and management  Lysle Dingwall 05/15/2020, 10:58 PM

## 2020-05-15 NOTE — Progress Notes (Signed)
   05/15/20 0728  Airway 8 mm  Placement Date/Time: 05/13/20 1800   Airway Device: Endotracheal Tube  Size (mm): 8 mm  Secured at (cm): 26 cm  Secured at (cm) 26 cm  Measured From Lips  Secured Location Right  Secured By Actuary Repositioned Yes  Prone position No  Cuff Pressure (cm H2O) 25 cm H2O  Site Condition Dry  Adult Ventilator Settings  Vent Type Servo i  Humidity HME  Vent Mode PRVC  Vt Set 450 mL  Set Rate 20 bmp  FiO2 (%) 40 %  I Time 0.8 Sec(s)  PEEP 5 cmH20  Adult Ventilator Measurements  Peak Airway Pressure 23 L/min  Mean Airway Pressure 13 cmH20  Plateau Pressure 24 cmH20  Resp Rate Spontaneous 0 br/min  Resp Rate Total 20 br/min  Exhaled Vt 437 mL  Measured Ve 8.9 mL  I:E Ratio Measured 1:2.7  Auto PEEP 0 cmH20  Total PEEP 5 cmH20  SpO2 97 %  Adult Ventilator Alarms  Alarms On Y  Ve High Alarm 21 L/min  Ve Low Alarm 3 L/min  Resp Rate High Alarm 40 br/min  Resp Rate Low Alarm 8  PEEP Low Alarm 2 cmH2O  Press High Alarm 40 cmH2O  VAP Prevention  Cuff pressure (initial) 25 cm H2O  Cuff pressure after change 25 cm H2O  HME changed No  Ventilator changed No  Transported while on vent No  HOB> 30 Degrees Y  Equipment wiped down Yes  Daily Weaning Assessment  Daily Assessment of Readiness to Wean Wean protocol criteria not met  Reason not met Apnea  Breath Sounds  Bilateral Breath Sounds Diminished  Airway Suctioning/Secretions  Suction Type ETT  Suction Device  Catheter  Secretion Amount Small  Secretion Color Tan  Secretion Consistency Thick  Suction Tolerance Tolerated well  Suctioning Adverse Effects None

## 2020-05-15 NOTE — Progress Notes (Addendum)
Inpatient Diabetes Program Recommendations  AACE/ADA: New Consensus Statement on Inpatient Glycemic Control (2015)  Target Ranges:  Prepandial:   less than 140 mg/dL      Peak postprandial:   less than 180 mg/dL (1-2 hours)      Critically ill patients:  140 - 180 mg/dL   Results for [name], [name] (MRN [id number]) as of 05/15/2020 06:57  Ref. Range 05/14/2020 11:15 05/14/2020 15:43 05/14/2020 19:33 05/14/2020 23:18 05/15/2020 03:07  Glucose-Capillary Latest Ref Range: 70 - 99 mg/dL 762 (H) 831 (H) 517 (H) 172 (H) 201 (H)    History: Type 1 Diabetes  Home DM Meds: NPH Insulin 20 units QAM  Current Orders: NPH Insulin 20 units QAM      Novolog Moderate Correction Scale/ SSI (0-15 units) Q4H (PRN)    MD- Note patient has NOT received ANY NPH Insulin since admission on 02/13--Dose has been HELD  Last dose Novolog was given 4pm on 02/13  Currently Novolog SSI only ordered as PRN medication and RNs have not been giving  1. Please change Novolog to the Following:  Novolog Sensitive Correction Scale/ SSI (0-9 units) Q4 hours and remove PRN from the order so that pt will get Novolog on Q4 hour basis  2. May also consider stopping the NPH Insulin for now and Starting Levemir 4 units BID (0.1 units/kg split dose)     --Will follow patient during hospitalization--  Ambrose Finland RN, MSN, CDE Diabetes Coordinator Inpatient Glycemic Control Team Team Pager: [phone] (8a-5p)

## 2020-05-15 NOTE — Procedures (Signed)
Extubation Procedure Note  Patient Details:   Name: [name] DOB: 09/24/1990 MRN: [id number]   Airway Documentation:    Vent end date: 05/15/20 Vent end time: 1225   Evaluation  O2 sats: stable throughout Complications: No apparent complications Patient did tolerate procedure well. Bilateral Breath Sounds: Diminished  Patient able to speak: Yes  [name] Form 05/15/2020, 12:27 PM

## 2020-05-15 NOTE — Progress Notes (Signed)
NAME:  [name], MRN:  [id number], DOB:  12-16-1990, LOS: 3 ADMISSION DATE:  05/12/2020, CONSULTATION DATE:  05/13/20 REFERRING MD:  [place], CHIEF COMPLAINT:  Hypoxia   Brief History   [name] is a 30 yo female with a pPMH of T1DM, COVID 3x, HTN, CKD (Stage 3), and osteomyelitis s/p Rt ray amputation by Dr. [name] (04/13/20). Subsequently went to [facility] with pain in right foot where she underwent I&D and VAC placement and transferred to [facility]. Clinical course complicated by increased O2 demand on BIPAP resulting in intubation. CXR showed  extensive bilateral airspace disease 2/2 to aspiration vs infection vs medication side effect now s/p bronchoscopy.   Past Medical History   . Allergy   . Anemia   . Anxiety   . Blood transfusion without reported diagnosis    Dec 2018  . Cataract    right eye  . COVID-19 03/2020  . Depression   . Diabetes type 1, uncontrolled ([place]) 11/14/2011   Since age 59  . Fibromyalgia   . Gastroparesis   . GERD (gastroesophageal reflux disease)   . Hypertension   . Infection    UTI April 2016     Significant Hospital Events   05/13/2020 transferred to intensive care unit 05/13/2020 Bronchoscopy  Consults:  PCCM, ID, Ortho  Procedures:  02/13 Intubated   Significant Diagnostic Tests:  02/13 CXR: The enteric tube projects over the gastric body.  02/14 CXR: 1. Unremarkable hardware. 2. Severe airspace disease with mildly improved lung volumes.   2/13 CXR: 1. Slight interval worsening of the bilateral hazy airspace opacities. 2. Support lines and tubes as above.  1. Significantly worsened diffuse bilateral airspace disease may reflect pneumonia, edema, or ARDS. 2. Unchanged borderline cardiomegaly.   02/13 ECHOCARDIOGRAM COMPLETE 1. Left ventricular ejection fraction, by estimation, is 60 to 65%. The left ventricle has normal function. The left ventricle has no regional wall motion abnormalities. There is mild  concentric left ventricular hypertrophy. Left ventricular diastolic parameters are indeterminate.   2. Right ventricular systolic function is normal. The right ventricular size is normal. There is moderately elevated pulmonary artery systolic pressure.   3. The mitral valve is normal in structure. Trivial mitral valve regurgitation.  4. Tricuspid valve regurgitation is mild to moderate.   5. The aortic valve is normal in structure. Aortic valve regurgitation is not visualized. No aortic stenosis is present.   6. The inferior vena cava is normal in size with greater than 50% respiratory variability, suggesting right atrial pressure of 3 mmHg.   Micro Data:  05/12/2020 blood cultures x2 05/12/2020 Covid negative 2/13 [place]ch Alveolar Lavage - moderate WBC mostly PMN, no organisms  2/13 S pneumo - negative 2/13 BCID - negative  2/13 CDiff - negative   Antimicrobials:  05/12/2020 daptomycin - 05/14/2020 05/12/2020 Unasyn - 05/14/20 05/13/20 Linezolid >> 05/14/20 Zosyn >>  Previous surgical cultures positive for MRSA.  Interim history/subjective:    No events overnight.   On evaluation today, patient is resting comfortably in bed with ET tube in place. She is alert and calm. She is not in apparent distress.   Objective   Blood pressure 124/78, pulse 84, temperature 99.8 F (37.7 C), temperature source Oral, resp. rate 20, height 5' 7"  (1.702 m), weight 82.1 kg, SpO2 98 %.    Vent Mode: PRVC FiO2 (%):  [40 %-50 %] 40 % Set Rate:  [20 bmp] 20 bmp Vt Set:  [450 mL] 450 mL PEEP:  [  [place] Pressure:  [20 cmH20-30 cmH20] 25 cmH20   Intake/Output Summary (Last 24 hours) at 05/15/2020 0542 Last data filed at 05/15/2020 0400 Gross per 24 hour  Intake 2335.62 ml  Output 131 ml  Net 2204.62 ml   Filed Weights   05/12/20 1035 05/14/20 0700 05/15/20 0500  Weight: 71 kg 83.3 kg 82.1 kg    Examination: General: ET tube in place, alert and calm HENT: pupils are  round and reactive, EOM intact Lungs: decreased breath sounds throughout Cardiovascular: RRR no murmurs rubs or gallops Abdomen: soft BS+ Extremities: Right chronic shin wound and recent ray amputation with significant edema Neuro: sedated but CN grossly intact.    Resolved Hospital Problem list     Assessment & Plan:   Acute hypoxic respiratory failure, pneumonia vs aspiration aspiration  Bilateral airspace opacities, suspect ARDS. Hx of COVID-10 pneumonia, 04/05/2020, 09/24/2019, 05/21/2019 Patient admitted to ICU with hypoxic respiratory failure and started on broad spectrum AB. Patient's WBC remains normal, Procal is elevated at 10.74>14.74, Respiratory PCR negative, Strep pneumo urine Ag negative, Resp and Blood cultures are pending. Her respiratory failure presumed secondary bilateral pneumonia. Antibiotics were broadened to Zosyn and Linezolid for better coverage for resistant organisms.  - continue broad spectrum AB (Day 3 of AB) - Cont LTV ventilation - On PPI for GI protection.  - VAP protocol - SAT/SBT when possible - PAD protocol with a RAS goal of -1/0  Type I diabetic: CBG is 201 this morning. Recently started TF yesterday. She may need additional TF coverage.   - On SSI and Novolin 20 units before breakfast. Will consider adjusting this today.  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 [name]) Subsequent I&D at [facility] with wound [place] application Orthopedic surgery consulted for further evaluation and management. On broad spectrum AB.  - Continue antibiotics.   Acute on CKD (Stage 3), pre-renal Proteinuria: Oliguric:   Patient has had 155 cc output in the last 24 hours and kidney function continues to decline with Cr of 2.59 (GFR 25) from yesterday of 1.9 (GFR of 36). Patient likely has ATN but will monitor her kidney function closely. - Avoid nephrotoxic medication when possible.  - No emergent needs for RRT -  Will continue to monitor daily.   Sepsis - acute pneumonia vs osteomyelitis  Patient presented with signs of sepsis. Sofa scores today have improved.  - BCx pending  - on broad spectrum AB.   Protein caloric malnutrition: Patient started on TF.   Best practice (evaluated daily)  Diet: Start TF today Pain/Anxiety/Delirium protocol (if indicated): propofol and fentynal VAP protocol (if indicated): appropriate DVT prophylaxis: Subq heparin  GI prophylaxis: ppi Glucose control: ssi Mobility: bedrest Disposition: icu  Goals of Care:  Last date of multidisciplinary goals of care discussion: 05/14/20 Family and staff present: patient s partner was at bed side. Discussed clinical situation with im today.  Summary of discussion: discuss current treatment plan Follow up goals of care discussion due: end of the week  Code Status: full   Labs   CBC: Recent Labs  Lab 05/12/20 0041 05/12/20 0935 05/12/20 1904 05/13/20 0530 05/13/20 2010 05/14/20 0530 05/15/20 0306  WBC 8.7 6.2  --  7.0  --  2.9* 7.0  NEUTROABS 7.2  --   --  6.0  --  2.1  --   HGB 3.7* 9.9* 9.2* 9.6* 9.5* 8.9* 8.6*  HCT 12.3*  31.1* 27.0* 28.4* 28.0* 26.6* 27.6*  MCV 93.9 91.5  --  88.2  --  89.6 91.1  PLT 147* 154  --  151  --  135* 914    Basic Metabolic Panel: Recent Labs  Lab 05/12/20 0041 05/12/20 0935 05/12/20 1904 05/13/20 0530 05/13/20 2010 05/14/20 0530 05/14/20 1819 05/15/20 0306  NA 135  --  135 137 137 136  --  137  K 3.9  --  3.8 3.8 3.9 3.6  --  4.0  CL 104  --   --  101  --  100  --  102  CO2 21*  --   --  24  --  24  --  22  GLUCOSE 270*  --   --  158*  --  139*  --  204*  BUN 27*  --   --  27*  --  28*  --  35*  CREATININE 1.78* 1.90*  --  1.90*  --  1.89*  --  2.59*  CALCIUM 7.9*  --   --  8.0*  --  7.7*  --  7.8*  MG  --   --   --  1.5*  --  1.8 1.7 1.9  PHOS  --   --   --   --   --  4.7* 5.1* 5.3*   GFR: Estimated Creatinine Clearance: 35 mL/min (A) (by C-G formula based on  SCr of 2.59 mg/dL (H)). Recent Labs  Lab 05/12/20 0935 05/13/20 0530 05/14/20 0530 05/15/20 0306  PROCALCITON  --  10.74 14.62  --   WBC 6.2 7.0 2.9* 7.0    Liver Function Tests: Recent Labs  Lab 05/12/20 0041 05/13/20 0530 05/14/20 0530  AST 100* 162* 78*  ALT 85* 103* 77*  ALKPHOS 120 192* 134*  BILITOT 0.5 2.1* 1.8*  PROT 5.8* 5.9* 5.5*  ALBUMIN 1.6* 1.4* 1.2*   No results for input(s): LIPASE, AMYLASE in the last 168 hours. No results for input(s): AMMONIA in the last 168 hours.  ABG    Component Value Date/Time   PHART 7.418 05/13/2020 2010   PCO2ART 43.7 05/13/2020 2010   PO2ART 147 (H) 05/13/2020 2010   HCO3 28.1 (H) 05/13/2020 2010   TCO2 29 05/13/2020 2010   ACIDBASEDEF 1.9 05/13/2020 0825   O2SAT 99.0 05/13/2020 2010     Coagulation Profile: No results for input(s): INR, PROTIME in the last 168 hours.  Cardiac Enzymes: No results for input(s): CKTOTAL, CKMB, CKMBINDEX, TROPONINI in the last 168 hours.  HbA1C: Hemoglobin A1C  Date/Time Value Ref Range Status  02/29/2020 03:58 PM 9.0 (A) 4.0 - 5.6 % Final   Hgb A1c MFr Bld  Date/Time Value Ref Range Status  04/10/2020 11:22 AM 8.6 (H) 4.8 - 5.6 % Final    Comment:    (NOTE) Pre diabetes:          5.7%-6.4%  Diabetes:              >6.4%  Glycemic control for   <7.0% adults with diabetes   09/25/2019 04:38 AM 8.3 (H) 4.8 - 5.6 % Final    Comment:    (NOTE) Pre diabetes:          5.7%-6.4%  Diabetes:              >6.4%  Glycemic control for   <7.0% adults with diabetes     CBG: Recent Labs  Lab 05/14/20 1115 05/14/20 1543 05/14/20 1933 05/14/20 2318 05/15/20 7829  GLUCAP 136* [place]    Critical care time:      Lawerance Cruel, D.O.  Internal Medicine Resident, PGY-2 [facility]  Pager: [phone] 5:42 AM, 05/15/2020
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
## 2020-05-16 ENCOUNTER — Inpatient Hospital Stay (HOSPITAL_COMMUNITY): Payer: 59

## 2020-05-16 DIAGNOSIS — R509 Fever, unspecified: Secondary | ICD-10-CM

## 2020-05-16 DIAGNOSIS — J9601 Acute respiratory failure with hypoxia: Secondary | ICD-10-CM | POA: Diagnosis not present

## 2020-05-16 DIAGNOSIS — R0603 Acute respiratory distress: Secondary | ICD-10-CM

## 2020-05-16 DIAGNOSIS — R410 Disorientation, unspecified: Secondary | ICD-10-CM

## 2020-05-16 DIAGNOSIS — T8781 Dehiscence of amputation stump: Secondary | ICD-10-CM | POA: Diagnosis not present

## 2020-05-16 DIAGNOSIS — E10621 Type 1 diabetes mellitus with foot ulcer: Secondary | ICD-10-CM

## 2020-05-16 DIAGNOSIS — J189 Pneumonia, unspecified organism: Secondary | ICD-10-CM | POA: Diagnosis not present

## 2020-05-16 DIAGNOSIS — D649 Anemia, unspecified: Secondary | ICD-10-CM | POA: Diagnosis not present

## 2020-05-16 DIAGNOSIS — N179 Acute kidney failure, unspecified: Secondary | ICD-10-CM | POA: Diagnosis not present

## 2020-05-16 LAB — MAGNESIUM
Magnesium: 1.8 mg/dL (ref 1.7–2.4)
Magnesium: 1.8 mg/dL (ref 1.7–2.4)
Magnesium: 1.7 mg/dL (ref 1.7–2.4)

## 2020-05-16 LAB — PHOSPHORUS
Phosphorus: 4.8 mg/dL — ABNORMAL HIGH (ref 2.5–4.6)
Phosphorus: 5 mg/dL — ABNORMAL HIGH (ref 2.5–4.6)
Phosphorus: 4.3 mg/dL (ref 2.5–4.6)

## 2020-05-16 LAB — CULTURE, RESPIRATORY W GRAM STAIN: Culture: NO GROWTH

## 2020-05-16 LAB — ECHOCARDIOGRAM COMPLETE
Area-P 1/2: 3.31 cm2
Calc EF: 49.7 %
Height: 67 in
S' Lateral: 2.9 cm
Single Plane A2C EF: 49.5 %
Single Plane A4C EF: 51.6 %
Weight: 2832.47 oz

## 2020-05-16 LAB — COMPREHENSIVE METABOLIC PANEL
ALT: 44 U/L (ref 0–44)
AST: 37 U/L (ref 15–41)
Albumin: 1.1 g/dL — ABNORMAL LOW (ref 3.5–5.0)
Alkaline Phosphatase: 129 U/L — ABNORMAL HIGH (ref 38–126)
Anion gap: 14 (ref 5–15)
BUN: 39 mg/dL — ABNORMAL HIGH (ref 6–20)
CO2: 23 mmol/L (ref 22–32)
Calcium: 7.8 mg/dL — ABNORMAL LOW (ref 8.9–10.3)
Chloride: 100 mmol/L (ref 98–111)
Creatinine, Ser: 2.37 mg/dL — ABNORMAL HIGH (ref 0.44–1.00)
GFR, Estimated: 28 mL/min — ABNORMAL LOW (ref >60)
Glucose, Bld: 61 mg/dL — ABNORMAL LOW (ref 70–99)
Potassium: 3.6 mmol/L (ref 3.5–5.1)
Sodium: 137 mmol/L (ref 135–145)
Total Bilirubin: 1.7 mg/dL — ABNORMAL HIGH (ref 0.3–1.2)
Total Protein: 6 g/dL — ABNORMAL LOW (ref 6.5–8.1)

## 2020-05-16 LAB — POCT I-STAT 7, (LYTES, BLD GAS, ICA,H+H)
Acid-Base Excess: 1 mmol/L (ref 0.0–2.0)
Bicarbonate: 25.5 mmol/L (ref 20.0–28.0)
Calcium, Ion: 1.12 mmol/L — ABNORMAL LOW (ref 1.15–1.40)
HCT: 25 % — ABNORMAL LOW (ref 36.0–46.0)
Hemoglobin: 8.5 g/dL — ABNORMAL LOW (ref 12.0–15.0)
O2 Saturation: 96 %
Potassium: 3.7 mmol/L (ref 3.5–5.1)
Sodium: 137 mmol/L (ref 135–145)
TCO2: 27 mmol/L (ref 22–32)
pCO2 arterial: 39 mmHg (ref 32.0–48.0)
pH, Arterial: 7.424 (ref 7.350–7.450)
pO2, Arterial: 80 mmHg — ABNORMAL LOW (ref 83.0–108.0)

## 2020-05-16 LAB — GLUCOSE, CAPILLARY
Glucose-Capillary: 77 mg/dL (ref 70–99)
Glucose-Capillary: 58 mg/dL — ABNORMAL LOW (ref 70–99)
Glucose-Capillary: 73 mg/dL (ref 70–99)
Glucose-Capillary: 425 mg/dL — ABNORMAL HIGH (ref 70–99)
Glucose-Capillary: 65 mg/dL — ABNORMAL LOW (ref 70–99)
Glucose-Capillary: 69 mg/dL — ABNORMAL LOW (ref 70–99)
Glucose-Capillary: 121 mg/dL — ABNORMAL HIGH (ref 70–99)
Glucose-Capillary: 179 mg/dL — ABNORMAL HIGH (ref 70–99)

## 2020-05-16 LAB — D-DIMER, QUANTITATIVE: D-Dimer, Quant: 15.05 ug/mL-FEU — ABNORMAL HIGH (ref 0.00–0.50)

## 2020-05-16 LAB — BRAIN NATRIURETIC PEPTIDE: B Natriuretic Peptide: 757.2 pg/mL — ABNORMAL HIGH (ref 0.0–100.0)

## 2020-05-16 IMAGING — DX DG ABDOMEN 1V
1 series · 1 of 1 positions shown · non-contrast
Comparison: [DATE]

CLINICAL DATA: OG tube placement

EXAM:
ABDOMEN - 1 VIEW

[abdomen kub]
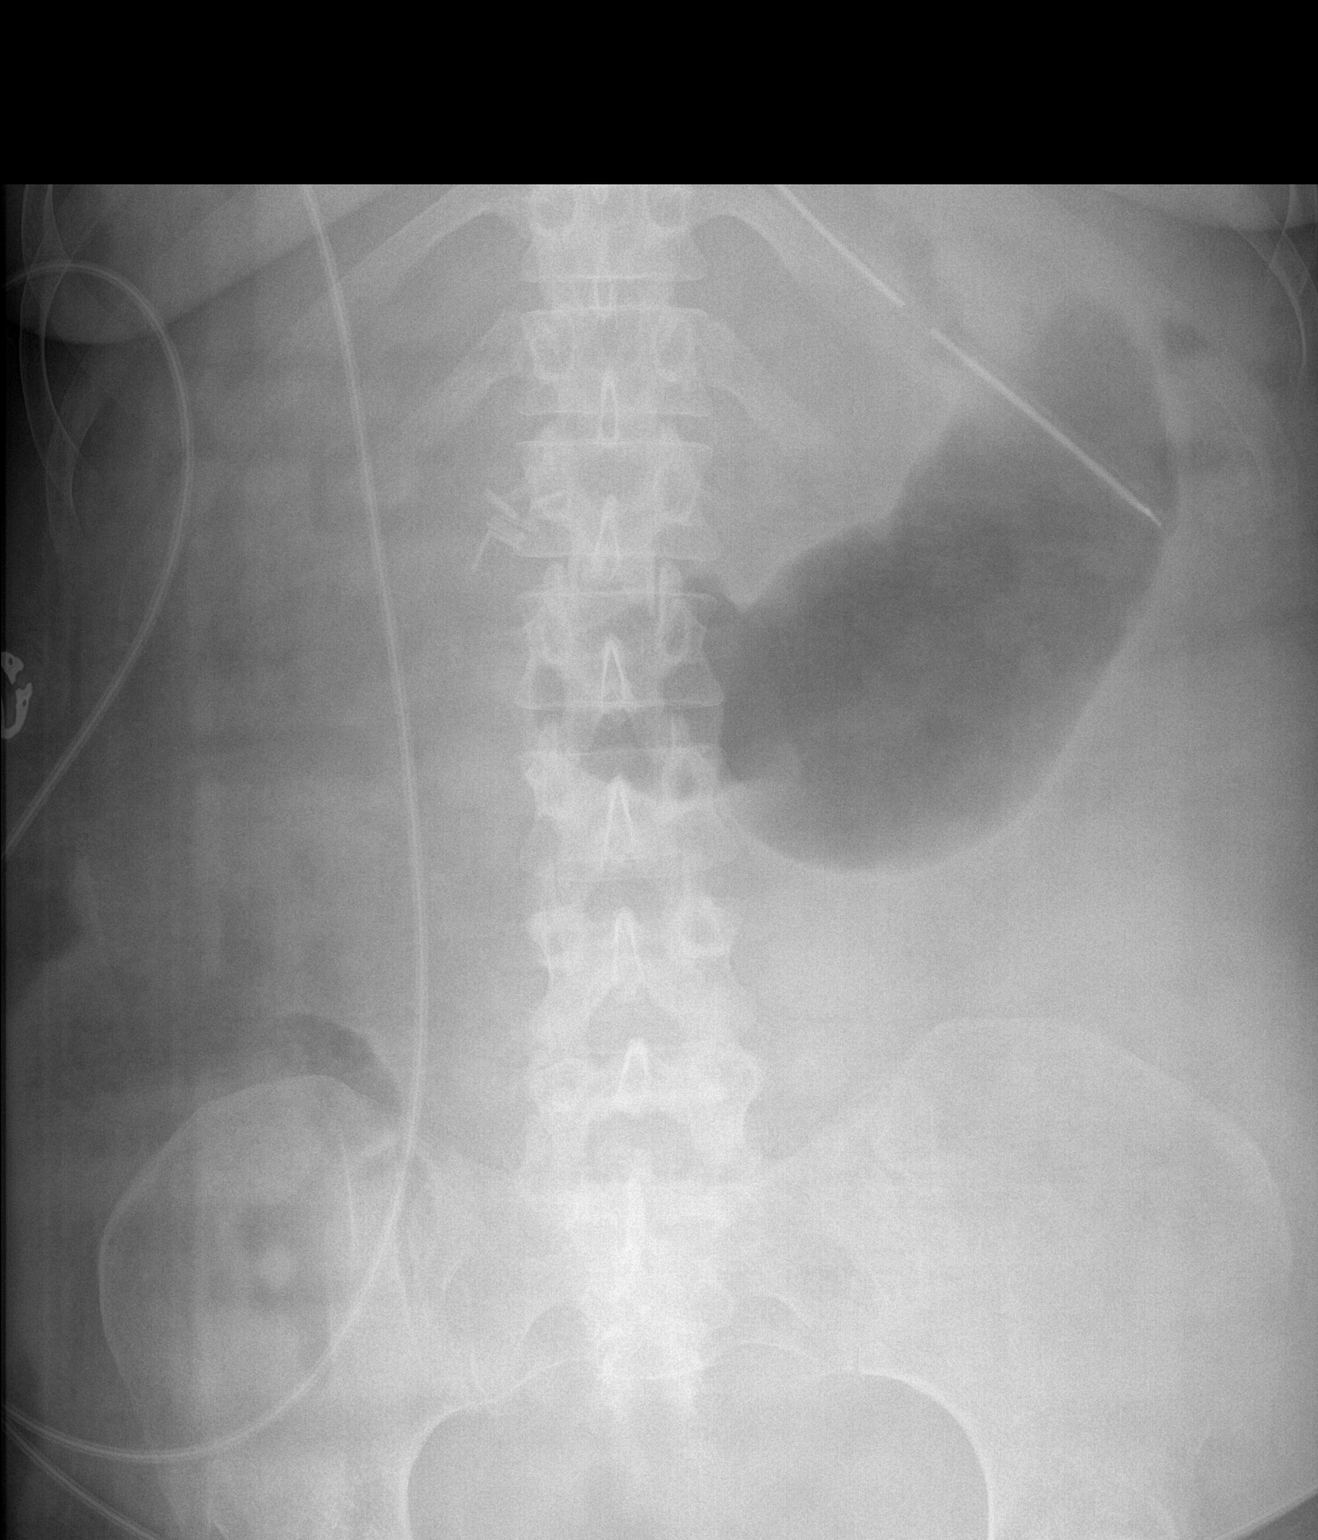

[1 of 1 positions shown; findings below may reference images not displayed]

FINDINGS: OG tube tip is in the proximal to mid stomach. Mild gaseous
distention of the stomach.
IMPRESSION: OG tube tip in the stomach.

## 2020-05-16 IMAGING — DX DG CHEST 1V PORT
1 series · 1 of 1 positions shown · non-contrast
Comparison: [DATE]

CLINICAL DATA: Hypoxia

EXAM:
PORTABLE CHEST 1 VIEW

[chest ap]
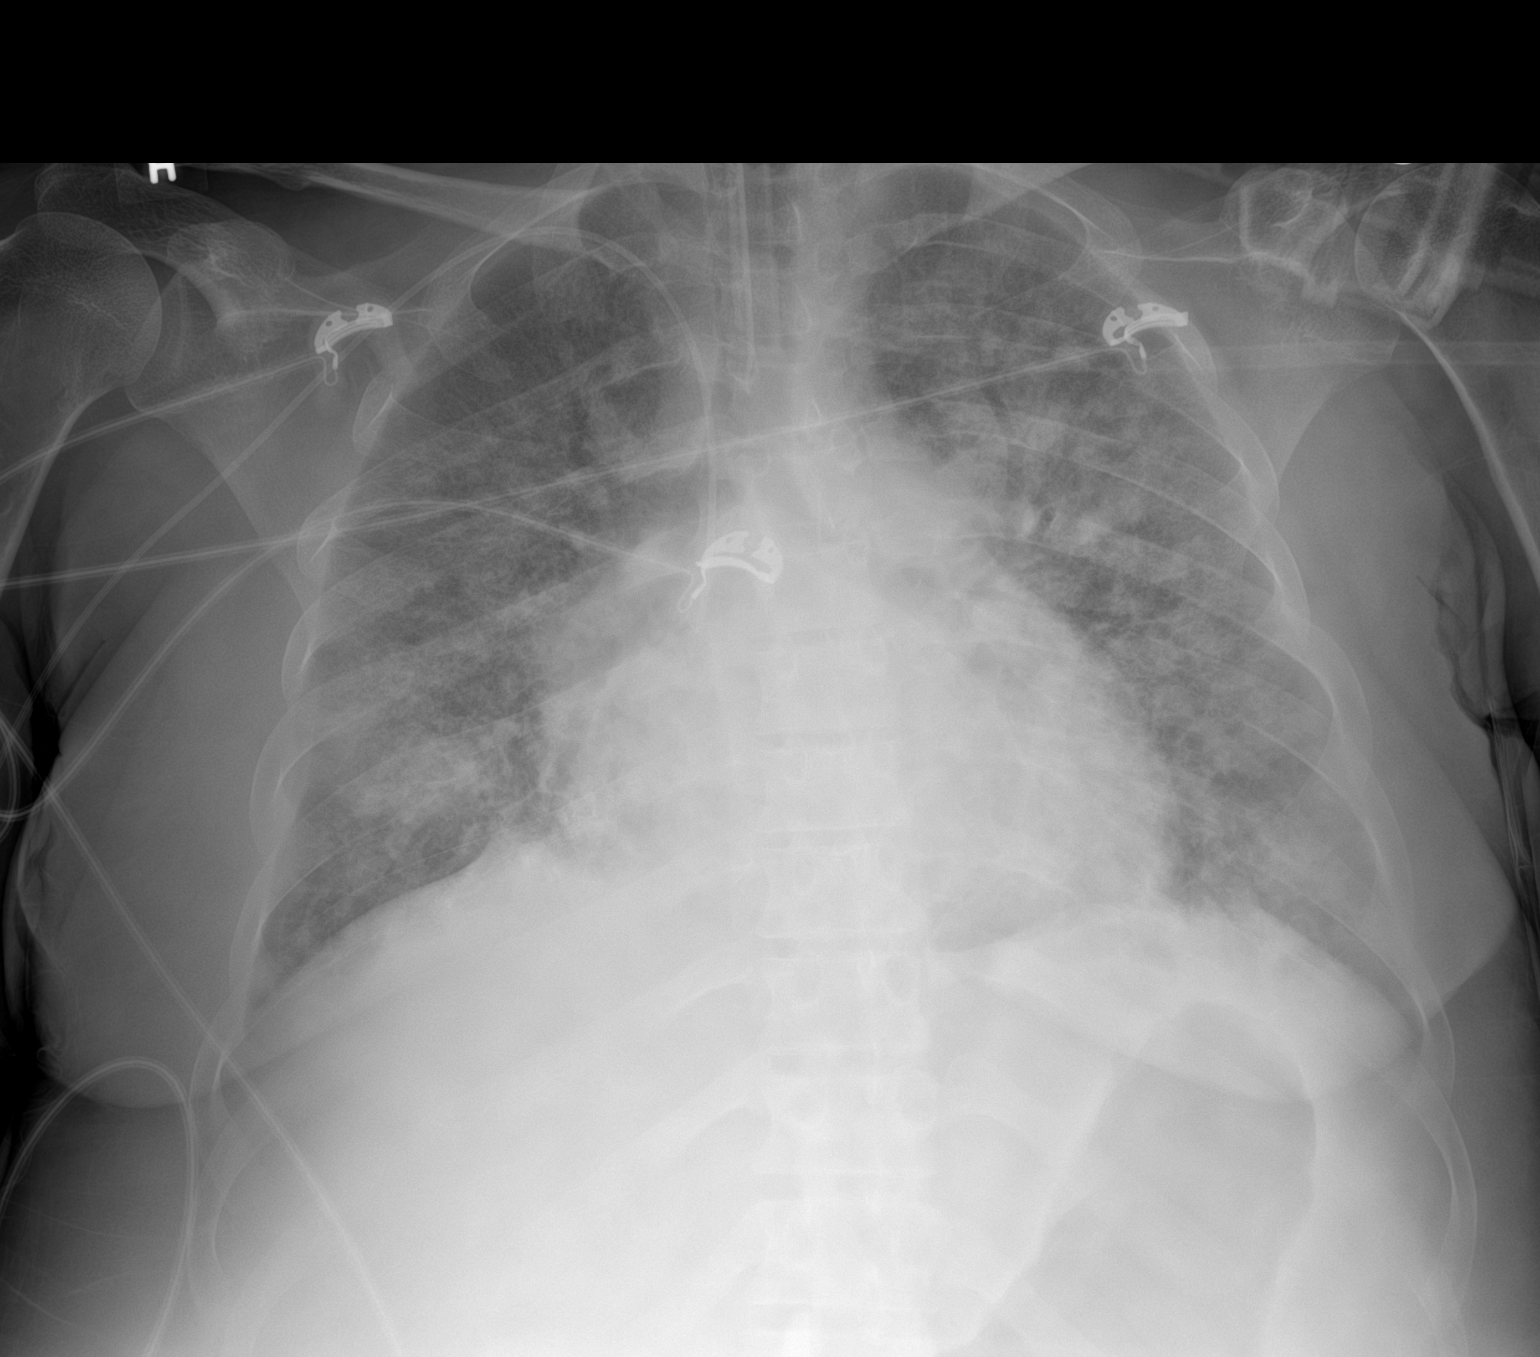

[1 of 1 positions shown; findings below may reference images not displayed]

FINDINGS: Endotracheal tube tip is 2.4 cm above the carina. Nasogastric tube
no longer present. Central catheter tip is in the superior vena cava
near the cavoatrial junction. No pneumothorax. There is multifocal
airspace opacity throughout the lungs bilaterally. There is less
opacity toward the apex on the right compared to 2 days prior.
Multifocal airspace opacity elsewhere appears essentially stable.
Heart is upper normal in size with pulmonary vascularity normal. No
adenopathy. No bone lesions.
IMPRESSION: Tube and catheter positions as described without evident
pneumothorax. Multifocal airspace opacity with less opacity in the
right apex compared to previous study and multifocal airspace
opacity elsewhere essentially stable. No new opacity evident.
Appearance consistent with multifocal pneumonia. Correlation with
[N4] status may be advisable. Note that noncardiogenic edema and
ARDS are differential considerations. More than one of these
entities may present concurrently. Stable cardiac silhouette.

## 2020-05-16 MED ORDER — GABAPENTIN 250 MG/5ML PO SOLN
300.0000 mg | Freq: Every day | ORAL | Status: DC
  Administered 2020-05-16 – 2020-05-19 (×4): 300 mg
  Filled 2020-05-16 (×5): qty 6

## 2020-05-16 MED ORDER — SODIUM CHLORIDE 0.9 % IV SOLN
3.0000 g | Freq: Four times a day (QID) | INTRAVENOUS | Status: DC
  Administered 2020-05-16 – 2020-05-17 (×4): 3 g via INTRAVENOUS
  Filled 2020-05-16 (×4): qty 8

## 2020-05-16 MED ORDER — INSULIN ASPART 100 UNIT/ML ~~LOC~~ SOLN
0.0000 [IU] | SUBCUTANEOUS | Status: DC
  Administered 2020-05-16: 9 [IU] via SUBCUTANEOUS
  Administered 2020-05-16 – 2020-05-17 (×2): 2 [IU] via SUBCUTANEOUS
  Administered 2020-05-17: 1 [IU] via SUBCUTANEOUS
  Administered 2020-05-17: 3 [IU] via SUBCUTANEOUS
  Administered 2020-05-17 (×2): 2 [IU] via SUBCUTANEOUS
  Administered 2020-05-18 (×2): 1 [IU] via SUBCUTANEOUS
  Administered 2020-05-18: 2 [IU] via SUBCUTANEOUS
  Administered 2020-05-18: 1 [IU] via SUBCUTANEOUS
  Administered 2020-05-18 – 2020-05-19 (×2): 3 [IU] via SUBCUTANEOUS
  Administered 2020-05-19: 2 [IU] via SUBCUTANEOUS
  Administered 2020-05-19: 5 [IU] via SUBCUTANEOUS
  Administered 2020-05-19 (×2): 2 [IU] via SUBCUTANEOUS
  Administered 2020-05-19: 3 [IU] via SUBCUTANEOUS
  Administered 2020-05-20 (×2): 2 [IU] via SUBCUTANEOUS

## 2020-05-16 MED ORDER — VITAL AF 1.2 CAL PO LIQD
1000.0000 mL | ORAL | Status: DC
  Administered 2020-05-16: 1000 mL

## 2020-05-16 MED ORDER — ROCURONIUM BROMIDE 10 MG/ML (PF) SYRINGE
PREFILLED_SYRINGE | INTRAVENOUS | Status: AC
  Administered 2020-05-16: 80 mg
  Filled 2020-05-16: qty 10

## 2020-05-16 MED ORDER — VITAL AF 1.2 CAL PO LIQD
1000.0000 mL | ORAL | Status: DC
  Administered 2020-05-16 – 2020-05-18 (×4): 1000 mL

## 2020-05-16 MED ORDER — VITAL HIGH PROTEIN PO LIQD
1000.0000 mL | ORAL | Status: DC
  Administered 2020-05-16: 1000 mL

## 2020-05-16 MED ORDER — DEXTROSE 50 % IV SOLN
INTRAVENOUS | Status: AC
  Filled 2020-05-16: qty 50

## 2020-05-16 MED ORDER — NOREPINEPHRINE 4 MG/250ML-% IV SOLN: 0.0000 ug/min | INTRAVENOUS | Status: DC

## 2020-05-16 MED ORDER — POLYETHYLENE GLYCOL 3350 17 G PO PACK
17.0000 g | PACK | Freq: Every day | ORAL | Status: DC
  Filled 2020-05-16: qty 1

## 2020-05-16 MED ORDER — FENTANYL BOLUS VIA INFUSION
50.0000 ug | INTRAVENOUS | Status: DC | PRN
Start: 2020-05-16 — End: 2020-05-19
  Administered 2020-05-16 – 2020-05-19 (×9): 50 ug via INTRAVENOUS
  Filled 2020-05-16: qty 50

## 2020-05-16 MED ORDER — ETOMIDATE 2 MG/ML IV SOLN
INTRAVENOUS | Status: AC
  Filled 2020-05-16: qty 20

## 2020-05-16 MED ORDER — DEXTROSE 50 % IV SOLN
12.5000 g | Freq: Once | INTRAVENOUS | Status: AC
  Administered 2020-05-16: 12.5 g via INTRAVENOUS

## 2020-05-16 MED ORDER — NYSTATIN 100000 UNIT/ML MT SUSP
5.0000 mL | Freq: Four times a day (QID) | OROMUCOSAL | Status: DC
  Administered 2020-05-16 – 2020-05-17 (×5): 500000 [IU] via OROMUCOSAL
  Filled 2020-05-16 (×5): qty 5

## 2020-05-16 MED ORDER — ONDANSETRON HCL 4 MG/2ML IJ SOLN
4.0000 mg | Freq: Four times a day (QID) | INTRAMUSCULAR | Status: DC | PRN
  Administered 2020-05-19: 4 mg via INTRAVENOUS
  Filled 2020-05-16: qty 2

## 2020-05-16 MED ORDER — MIDAZOLAM HCL 2 MG/2ML IJ SOLN: 2.0000 mg | INTRAMUSCULAR | Status: DC | PRN

## 2020-05-16 MED ORDER — HEPARIN BOLUS VIA INFUSION
4500.0000 [IU] | Freq: Once | INTRAVENOUS | Status: AC
  Administered 2020-05-16: 4500 [IU] via INTRAVENOUS
  Filled 2020-05-16: qty 4500

## 2020-05-16 MED ORDER — DOCUSATE SODIUM 50 MG/5ML PO LIQD: 100.0000 mg | Freq: Two times a day (BID) | ORAL | Status: DC

## 2020-05-16 MED ORDER — CARVEDILOL 12.5 MG PO TABS
12.5000 mg | ORAL_TABLET | Freq: Two times a day (BID) | ORAL | Status: DC
  Administered 2020-05-17 – 2020-05-20 (×6): 12.5 mg
  Filled 2020-05-16 (×6): qty 1

## 2020-05-16 MED ORDER — MIDAZOLAM HCL 2 MG/2ML IJ SOLN
INTRAMUSCULAR | Status: AC
  Filled 2020-05-16: qty 4

## 2020-05-16 MED ORDER — DEXTROSE 50 % IV SOLN
INTRAVENOUS | Status: AC
  Administered 2020-05-16: 25 mL
  Filled 2020-05-16: qty 50

## 2020-05-16 MED ORDER — ETOMIDATE 2 MG/ML IV SOLN
20.0000 mg | Freq: Once | INTRAVENOUS | Status: AC
  Administered 2020-05-16: 20 mg via INTRAVENOUS

## 2020-05-16 MED ORDER — PANTOPRAZOLE SODIUM 40 MG PO PACK
40.0000 mg | PACK | Freq: Every day | ORAL | Status: DC
  Administered 2020-05-17 – 2020-05-20 (×4): 40 mg
  Filled 2020-05-16 (×4): qty 20

## 2020-05-16 MED ORDER — DEXTROSE 50 % IV SOLN: 12.5000 g | INTRAVENOUS | Status: AC

## 2020-05-16 MED ORDER — FENTANYL 2500MCG IN NS 250ML (10MCG/ML) PREMIX INFUSION
50.0000 ug/h | INTRAVENOUS | Status: DC
  Administered 2020-05-16: 50 ug/h via INTRAVENOUS
  Administered 2020-05-17 – 2020-05-18 (×2): 100 ug/h via INTRAVENOUS
  Administered 2020-05-18: 200 ug/h via INTRAVENOUS
  Filled 2020-05-16 (×4): qty 250

## 2020-05-16 MED ORDER — POLYETHYLENE GLYCOL 3350 17 G PO PACK: 17.0000 g | PACK | Freq: Every day | ORAL | Status: DC

## 2020-05-16 MED ORDER — INSULIN ASPART 100 UNIT/ML ~~LOC~~ SOLN: 4.0000 [IU] | Freq: Three times a day (TID) | SUBCUTANEOUS | Status: DC

## 2020-05-16 MED ORDER — ADULT MULTIVITAMIN W/MINERALS CH
1.0000 | ORAL_TABLET | Freq: Every day | ORAL | Status: DC
  Administered 2020-05-17 – 2020-05-20 (×4): 1
  Filled 2020-05-16 (×4): qty 1

## 2020-05-16 MED ORDER — MIDAZOLAM HCL 2 MG/2ML IJ SOLN
4.0000 mg | Freq: Once | INTRAMUSCULAR | Status: AC
  Administered 2020-05-16: 4 mg via INTRAVENOUS

## 2020-05-16 MED ORDER — DOCUSATE SODIUM 50 MG/5ML PO LIQD
100.0000 mg | Freq: Two times a day (BID) | ORAL | Status: DC
  Administered 2020-05-16: 100 mg
  Filled 2020-05-16 (×2): qty 10

## 2020-05-16 MED ORDER — OXYCODONE-ACETAMINOPHEN 5-325 MG PO TABS: 1.0000 | ORAL_TABLET | ORAL | Status: DC | PRN

## 2020-05-16 MED ORDER — FENTANYL CITRATE (PF) 100 MCG/2ML IJ SOLN
50.0000 ug | Freq: Once | INTRAMUSCULAR | Status: AC
  Administered 2020-05-16: 50 ug via INTRAVENOUS

## 2020-05-16 MED ORDER — DEXTROSE 50 % IV SOLN
12.5000 g | INTRAVENOUS | Status: AC
  Administered 2020-05-16: 12.5 g via INTRAVENOUS

## 2020-05-16 MED ORDER — ACETAMINOPHEN 160 MG/5ML PO SOLN: 500.0000 mg | Freq: Four times a day (QID) | ORAL | Status: DC | PRN

## 2020-05-16 MED ORDER — PROPOFOL 1000 MG/100ML IV EMUL
5.0000 ug/kg/min | INTRAVENOUS | Status: DC
  Administered 2020-05-16: 5 ug/kg/min via INTRAVENOUS
  Administered 2020-05-17: 20 ug/kg/min via INTRAVENOUS
  Filled 2020-05-16 (×2): qty 100

## 2020-05-16 MED ORDER — FENTANYL CITRATE (PF) 100 MCG/2ML IJ SOLN
50.0000 ug | Freq: Once | INTRAMUSCULAR | Status: AC
Start: 2020-05-16 — End: 2020-05-16
  Administered 2020-05-16: 50 ug via INTRAVENOUS

## 2020-05-16 MED ORDER — CHLORHEXIDINE GLUCONATE 0.12 % MT SOLN
OROMUCOSAL | Status: AC
  Filled 2020-05-16: qty 15

## 2020-05-16 MED ORDER — POLYETHYLENE GLYCOL 3350 17 G PO PACK
17.0000 g | PACK | Freq: Every day | ORAL | Status: DC
  Administered 2020-05-16: 17 g via ORAL
  Filled 2020-05-16: qty 1

## 2020-05-16 MED ORDER — FENTANYL CITRATE (PF) 100 MCG/2ML IJ SOLN
INTRAMUSCULAR | Status: AC
  Filled 2020-05-16: qty 2

## 2020-05-16 MED ORDER — PROSOURCE TF PO LIQD
45.0000 mL | Freq: Two times a day (BID) | ORAL | Status: DC
  Administered 2020-05-16: 45 mL
  Filled 2020-05-16: qty 45

## 2020-05-16 MED ORDER — HEPARIN (PORCINE) 25000 UT/250ML-% IV SOLN
1400.0000 [IU]/h | INTRAVENOUS | Status: DC
  Administered 2020-05-16: 1100 [IU]/h via INTRAVENOUS
  Administered 2020-05-17: 1400 [IU]/h via INTRAVENOUS
  Filled 2020-05-16 (×2): qty 250

## 2020-05-16 MED ORDER — ONDANSETRON HCL 4 MG PO TABS
4.0000 mg | ORAL_TABLET | Freq: Four times a day (QID) | ORAL | Status: DC | PRN
  Administered 2020-05-19: 4 mg
  Filled 2020-05-16: qty 1

## 2020-05-16 MED ORDER — ROCURONIUM BROMIDE 50 MG/5ML IV SOLN
80.0000 mg | Freq: Once | INTRAVENOUS | Status: AC
  Filled 2020-05-16: qty 8

## 2020-05-16 NOTE — Progress Notes (Signed)
Patient ID: [name], female   DOB: Sep 03, 1990, 30 y.o.   MRN: [id number] Patient is currently on BiPAP.  Her right foot first ray amputation wound has improved there is improved granulation tissue there is no exposed bone or tendon.  We will continue with daily dressing changes and the protective boots anticipate possible surgical intervention after discharge.

## 2020-05-16 NOTE — Procedures (Signed)
Intubation Procedure Note  [name]  [id number]  September 28, 1990  Date:05/16/20  Time:10:31 AM   Provider Performing:Kirk Sampley [place] Payment    Procedure: Intubation (31500)  Indication(s) Respiratory Failure  Consent Unable to obtain consent due to emergent nature of procedure.   Anesthesia Etomidate, Versed, Fentanyl and Rocuronium   Time Out Verified patient identification, verified procedure, site/side was marked, verified correct patient position, special equipment/implants available, medications/allergies/relevant history reviewed, required imaging and test results available.   Sterile Technique Usual hand hygeine, masks, and gloves were used   Procedure Description Patient positioned in bed supine.  Sedation given as noted above.  Patient was intubated with endotracheal tube using Glidescope.  View was Grade 1 full glottis .  Number of attempts was 1.  Colorimetric CO2 detector was consistent with tracheal placement.   Complications/Tolerance None; patient tolerated the procedure well. Chest X-ray is ordered to verify placement.   EBL Minimal   Specimen(s) None   Lawerance Cruel, D.O.  Internal Medicine Resident, PGY-2 [facility]  Pager: [phone] 10:33 AM, 05/16/2020

## 2020-05-16 NOTE — Progress Notes (Signed)
OT Cancellation Note  Patient Details Name: [name] MRN: [id number] DOB: 1990-07-20   Cancelled Treatment:    Reason Eval/Treat Not Completed: Patient not medically ready (RN told me to give her another day due to poor respiratory status and "no reserves" She just switched from Bipap to [place]. OT to continue to follow for OT eval when medically appropriate.)   Jefferey Pica, OTR/L Acute Rehabilitation Services Pager: [phone] Office: [phone]   ALLISON C 05/16/2020, 10:24 AM

## 2020-05-16 NOTE — Progress Notes (Signed)
   05/16/20 1112  Vent Select  Invasive or Noninvasive Invasive  Adult Vent Y  Airway 7.5 mm  Placement Date/Time: 05/16/20 1011   Placed By: ICU physician  Airway Device: Endotracheal Tube  Laryngoscope Blade: 3  ETT Types: Oral  Size (mm): 7.5 mm  Cuffed: Cuffed  Insertion attempts: 1  Airway Equipment: Stylet;Video Laryngoscope  Placement C...  Secured at (cm) 24 cm  Measured From Lips  Secured Location Left  Secured By Actuary Repositioned Yes  Cuff Pressure (cm H2O) 24 cm H2O  Site Condition Dry  Adult Ventilator Settings  Vent Type Servo i  Humidity HME  Vent Mode PRVC  Vt Set 450 mL  Set Rate 20 bmp  FiO2 (%) 60 %  I Time 0.8 Sec(s)  PEEP 10 cmH20  Adult Ventilator Measurements  Peak Airway Pressure 28 L/min  Mean Airway Pressure 14 cmH20  Plateau Pressure 26 cmH20  Resp Rate Spontaneous 1 br/min  Resp Rate Total 21 br/min  Exhaled Vt 442 mL  Measured Ve 9.6 mL  I:E Ratio Measured 1:2.7  Auto PEEP 0 cmH20  Total PEEP 10 cmH20  SpO2 95 %  Adult Ventilator Alarms  Alarms On Y  Ve High Alarm 20 L/min  Ve Low Alarm 4 L/min  Resp Rate High Alarm 35 br/min  Resp Rate Low Alarm 8  PEEP Low Alarm 2 cmH2O  Press High Alarm 45 cmH2O  VAP Prevention  HME changed No  Ventilator changed No  Transported while on vent No  HOB> 30 Degrees Y  Breath Sounds  Bilateral Breath Sounds Diminished

## 2020-05-16 NOTE — Progress Notes (Signed)
  Echocardiogram 2D Echocardiogram has been performed. Dr. Gardiner Rhyme contacted to read STAT echo.  Darlina Sicilian M 05/16/2020, 12:16 PM

## 2020-05-16 NOTE — Progress Notes (Signed)
PT Cancellation Note  Patient Details Name: CARTINA BROUSSEAU MRN: [id number] DOB: December 24, 1990   Cancelled Treatment:    Reason Eval/Treat Not Completed: Medical issues which prohibited therapy   Discussed with RN. Reports she desaturates with any movement and feels she needs one more day prior to initiating mobility.    Arby Barrette, PT Pager [phone]  Rexanne Mano 05/16/2020, 8:48 AM

## 2020-05-16 NOTE — Progress Notes (Signed)
SLP Cancellation Note  Patient Details Name: [name] MRN: [id number] DOB: 1990/11/10   Cancelled treatment:       Reason Eval/Treat Not Completed: Patient not medically ready (Pt is currently on HFNC with SpO2 in the mid to low 80s and and she is currently requesting of the RN that she go back on biPAP. PCCM came to assess pt and decided to re-intubate pt. SLP will sign off at this time and await new orders when clinically indicated.)  Kayn Haymore I. Hardin Negus, [place], [place] Office number [phone] Pager [place] 05/16/2020, 9:40 AM

## 2020-05-16 NOTE — Progress Notes (Signed)
Nutrition Follow-up  DOCUMENTATION CODES:   Non-severe (moderate) malnutrition in context of social or environmental circumstances  INTERVENTION:   Initiate tube feeding via OG tube: Vital AF 1.2 at 65 ml/h (1560 ml per day)  Provides 1872 kcals, 117 gm protein, 1265 ml free water daily  MVI with minerals via tube daily.  NUTRITION DIAGNOSIS:   Moderate Malnutrition related to social / environmental circumstances (difficulty chewing due to poor dentition) as evidenced by mild fat depletion,mild muscle depletion.  Ongoing   GOAL:   Patient will meet greater than or equal to 90% of their needs  Met with TF  MONITOR:   Vent status,TF tolerance,Labs,Skin  REASON FOR ASSESSMENT:   Ventilator,Consult Enteral/tube feeding initiation and management  ASSESSMENT:   30 yo female admitted with suspected aspiration, sepsis r/t foot infection, ARDS. 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   Discussed patient in ICU rounds and with RN today. Extubated 2/15, re-intubated this morning. Received MD Consult to resume TF.  Patient is currently intubated on ventilator support MV: 9.6 L/min Temp (24hrs), Avg:99.6 F (37.6 C), Min:98.5 F (36.9 C), Max:101.3 F (38.5 C)   Labs reviewed. Phos 5 CBG: [phone]  Medications reviewed and include Colace, Lasix, Novolog, MVI with minerals, Protonix, Miralax,   Weight 71 kg on admission, 80.3 kg today Increased weight related to edema. UOP 915 ml x 24 hours I/O +5.9 L   Diet Order:   Diet Order            Diet NPO time specified  Diet effective now                 EDUCATION NEEDS:   Not appropriate for education at this time  Skin:  Skin Assessment: Skin Integrity Issues: Skin Integrity Issues:: Other (Comment) Other: R foot osteomyelitis  Last BM:  2/15  Height:   Ht Readings from Last 1 Encounters:   05/12/20 5' 7"  (1.702 m)    Weight:   Wt Readings from Last 1 Encounters:  05/16/20 80.3 kg    Ideal Body Weight:  61.4 kg  BMI:  Body mass index is 27.73 kg/m.  Estimated Nutritional Needs:   Kcal:  1920  Protein:  100-120 gm  Fluid:  >/= 2 L    Lucas Mallow, RD, LDN, CNSC Please refer to Amion for contact information.

## 2020-05-16 NOTE — Progress Notes (Signed)
Inpatient Diabetes Program Recommendations  AACE/ADA: New Consensus Statement on Inpatient Glycemic Control (2015)  Target Ranges:  Prepandial:   less than 140 mg/dL      Peak postprandial:   less than 180 mg/dL (1-2 hours)      Critically ill patients:  140 - 180 mg/dL   Lab Results  Component Value Date   GLUCAP 73 05/16/2020   HGBA1C 8.6 (H) 04/10/2020    Review of Glycemic Control Results for [name], [name] (MRN [id number]) as of 05/16/2020 09:43  Ref. Range 05/15/2020 23:02 05/16/2020 03:03 05/16/2020 06:00 05/16/2020 06:57  Glucose-Capillary Latest Ref Range: 70 - 99 mg/dL 295 (H) 77 58 (L) 73    History: Type 1 Diabetes  Home DM Meds: NPH Insulin 20 units QAM  Current Orders: Novolog 4 units TID                            Novolog Moderate Correction Scale/ SSI (0-9 units) Q4H   Noted hypoglycemia this AM of 58 mg/dL following BID dosing of Levmemir and discontinuation of tube feeds. Basal insulin discontinued. Patient re-intubated.   Consider: - adding Levemir 5 units QD (to start now) -Changing meal coverage to tube feed coverage now that tube feeds have been restarted: Novolog 2 units Q4H (to be stopped or held in the event tube feeds are stopped).  -With patient being sensitive to insulin, would change correction back to Novolog 0-6 units Q4H to prevent hypoglycemia. Secure chat sent to md.  Thanks, Lujean Rave, MSN, RNC-OB Diabetes Coordinator [phone] (8a-5p)

## 2020-05-16 NOTE — Progress Notes (Signed)
ANTICOAGULATION CONSULT NOTE - Initial Consult  Pharmacy Consult for heparin  Indication: r/o pulmonary embolus  Allergies  Allergen Reactions  . Other Anaphylaxis    Reaction to [place] nuts   . Lactose Intolerance (Gi) Diarrhea    Patient Measurements: Height: 5' 7"  (170.2 cm) Weight: 80.3 kg (177 lb 0.5 oz) IBW/kg (Calculated) : 61.6 Heparin Dosing Weight: 71kg  Vital Signs: Temp: 100.5 F (38.1 C) (02/16 1122) Temp Source: Axillary (02/16 1122) BP: 137/77 (02/16 1000) Pulse Rate: 104 (02/16 1000)  Labs: Recent Labs    05/14/20 0530 05/15/20 0306 05/16/20 0624 05/16/20 1111  HGB 8.9* 8.6*  --  8.5*  HCT 26.6* 27.6*  --  25.0*  PLT 135* 185  --   --   CREATININE 1.89* 2.59* 2.37*  --     Estimated Creatinine Clearance: 37.9 mL/min (A) (by C-G formula based on SCr of 2.37 mg/dL (H)).   Medical History: Past Medical History:  Diagnosis Date  . Allergy   . Anemia   . Anxiety   . Blood transfusion without reported diagnosis    Dec 2018  . Cataract    right eye  . COVID-19 03/2020  . Depression   . Diabetes type 1, uncontrolled ([place]) 11/14/2011   Since age 75  . Fibromyalgia   . Gastroparesis   . GERD (gastroesophageal reflux disease)   . Hypertension   . Infection    UTI April 2016   Assessment: Patient admitted to ICU on 2/13 for AMS. Required intubation and was extubated yesterday; was reintubated this morning due to episode of bradycardia and hypotension for unknown reason. CCM team concerned for PE, so initiating heparin for r/o PE. Will not likely get CTA today given her Scr, but will follow D-dimer (15>10.5 on 2/14).  Will bolus heparin 4500 units followed by 1110 units/hr due to patients age and weight. Will check 8-hour heparin level due to patient's CrCl ~40 ml/min.  Goal of Therapy:  Heparin level 0.3-0.7 units/ml Monitor platelets by anticoagulation protocol: Yes   Plan:  Heparin bolus 4500 units followed by 1100 unit/hr Check 8-hour  heparin level  Alfonse Spruce, PharmD PGY2 ID Pharmacy Resident Phone between 7 am - 3:30 pm: [phone]  Please check AMION for all [place] phone numbers After 10:00 PM, call [place] [phone]  05/16/2020,11:35 AM

## 2020-05-16 NOTE — Progress Notes (Addendum)
[facility] Infectious Diseases Follow Up Note  Patient Identification: Patient Name: [name] MRN: [id number] [place] Date: 05/12/2020 12:22 AM Age: 30 y.o.Today's Date: 05/16/2020   Reason for Visit:  Aspiration PNA and rt foot wound dehiscence   Principal Problem:   Chronic anemia Active Problems:   Hypertension associated with diabetes ([place])   Diabetes mellitus type 1 ([place])   Severe protein-calorie malnutrition ([place])   Osteomyelitis (H[facility])   GI bleeding   AKI (acute kidney injury) ([place])   Diarrhea   Acute upper GI bleed   Respiratory failure (HCC)  Antibiotics:  Unasyn 2/12- c                     linezolid 2/13- c                     Daptomycin prior to admit - 2/12                     Ciprofloxacin prior to admit - 2/12   Lines/Tubes: picc+  Interval Events: Extubated yesterday, on HFNC, febrile , no leukocytosis    Assessment # Fevers  # Acute respiratory failure 2/2 aspiration PNA - extubated  # Wound dehiscence at the site of 1st ray amputation - 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 Blood cx ( peripheral and central) ordered for fevers  Monitor CBC and BMP on IV antibiotics  Will plan to switch antibiotics to daptomycin and ceftriaxone for rt foot wound dehiscence after completing treatment for aspiration PNA to be continued until 3/16 as previously planned. I did not see any cultures positive for pseudomonas.   Rest of the management as per the primary team. Thank you for  the consult. Please page with pertinent questions or concerns.  ______________________________________________________________________ Subjective patient seen and examined at the bedside. On HFNC , fatigued and hard to speak,. Complains of fevers    Vitals BP 118/72   Pulse 98   Temp (!) 101.3 F (38.5 C) (Axillary)   Resp (!) 31   Ht _0  (1.702 m)   Wt 80.3 kg   SpO2 98%   BMI 27.73 kg/m     Physical Exam Constitutional:  tired and fatigued, on HFNC    Comments:   Cardiovascular:     Rate and Rhythm: Normal rate and regular rhythm.     Heart sounds: No murmur heard.   Pulmonary:     Effort: coarse breath sounds     Comments:   Abdominal:     Palpations: Abdomen is soft.     Tenderness: Non tender   Musculoskeletal:        General: No swelling or tenderness.   Skin:    Comments: no obvious rashes   Neurological:     General: No focal deficit present.   Psychiatric:        Mood and Affect:drowsy  Pertinent Microbiology Results for orders placed or performed during the hospital encounter of 05/12/20  SARS  CORONAVIRUS 2 (TAT 6-24 HRS) Nasopharyngeal Nasopharyngeal Swab     Status: None   Collection Time: 05/12/20  8:21 AM   Specimen: Nasopharyngeal Swab  Result Value Ref Range Status   SARS Coronavirus 2 NEGATIVE NEGATIVE Final    Comment: (NOTE) SARS-CoV-2 target nucleic acids are NOT DETECTED.  The SARS-CoV-2 RNA is generally detectable in upper and lower respiratory specimens during the acute phase of infection. Negative results do not preclude SARS-CoV-2 infection, do not rule out co-infections with other pathogens, and should not be used as the sole basis for treatment or other patient management decisions. Negative results must be combined with clinical observations, patient history, and epidemiological information. The expected result is Negative.  Fact Sheet for Patients: SugarRoll.be  Fact Sheet for Healthcare  Providers: https://www.woods-mathews.com/  This test is not yet approved or cleared by the [place] FDA and  has been authorized for detection and/or diagnosis of SARS-CoV-2 by FDA under an Emergency Use Authorization (EUA). This EUA will remain  in effect (meaning this test can be used) for the duration of the COVID-19 declaration under Se ction 564(b)(1) of the Act, 21 U.S.C. section 360bbb-3(b)(1), unless the authorization is terminated or revoked sooner.  Performed at [facility], [place] [address], [place], [place] [address]   C Difficile Quick Screen w PCR reflex     Status: None   Collection Time: 05/13/20  6:27 AM   Specimen: STOOL  Result Value Ref Range Status   C Diff antigen NEGATIVE NEGATIVE Final   C Diff toxin NEGATIVE NEGATIVE Final   C Diff interpretation No C. difficile detected.  Final    Comment: Performed at [facility], [place] [address], [place], [place] [address]  Respiratory (~20 pathogens) panel by PCR     Status: None   Collection Time: 05/13/20  3:45 PM   Specimen: Nasopharyngeal Swab; Respiratory  Result Value Ref Range Status   Adenovirus NOT DETECTED NOT DETECTED Final   Coronavirus 229E NOT DETECTED NOT DETECTED Final    Comment: (NOTE) The Coronavirus on the Respiratory Panel, DOES NOT test for the novel  Coronavirus (2019 nCoV)    Coronavirus HKU1 NOT DETECTED NOT DETECTED Final   Coronavirus NL63 NOT DETECTED NOT DETECTED Final   Coronavirus OC43 NOT DETECTED NOT DETECTED Final   Metapneumovirus NOT DETECTED NOT DETECTED Final   Rhinovirus / Enterovirus NOT DETECTED NOT DETECTED Final   Influenza A NOT DETECTED NOT DETECTED Final   Influenza B NOT DETECTED NOT DETECTED Final   Parainfluenza Virus 1 NOT DETECTED NOT DETECTED Final   Parainfluenza Virus 2 NOT DETECTED NOT DETECTED Final   Parainfluenza Virus 3 NOT DETECTED NOT DETECTED Final   Parainfluenza Virus 4 NOT DETECTED NOT DETECTED Final   Respiratory  Syncytial Virus NOT DETECTED NOT DETECTED Final   Bordetella pertussis NOT DETECTED NOT DETECTED Final   Bordetella Parapertussis NOT DETECTED NOT DETECTED Final   Chlamydophila pneumoniae NOT DETECTED NOT DETECTED Final   Mycoplasma pneumoniae NOT DETECTED NOT DETECTED Final    Comment: Performed at [facility], [place]. [address], [place], [place] [address]  Culture, respiratory     Status: None   Collection Time: 05/13/20  6:04 PM   Specimen: Bronchoalveolar Lavage; Respiratory  Result Value Ref Range Status   Specimen Description BRONCHIAL ALVEOLAR LAVAGE  Final   Special Requests NONE  Final   Gram Stain   Final    MODERATE WBC PRESENT, PREDOMINANTLY PMN NO ORGANISMS SEEN  Culture   Final    NO GROWTH Performed at [facility], [place] [address], [place], [place] [address]    Report Status 05/16/2020 FINAL  Final  Acid Fast Smear (AFB)     Status: None   Collection Time: 05/13/20  6:04 PM   Specimen: Bronchial Alveolar Lavage  Result Value Ref Range Status   AFB Specimen Processing Concentration  Final   Acid Fast Smear Negative  Final    Comment: (NOTE) Performed At: [facility] [name], [place] [id number] Rush Farmer MD PR:[id number]    Source (AFB) BRONCHIAL ALVEOLAR LAVAGE  Final    Comment: Performed at [facility], [place] [address], [place], [place] [address]  Culture, blood (routine x 2)     Status: None (Preliminary result)   Collection Time: 05/14/20  4:06 PM   Specimen: BLOOD  Result Value Ref Range Status   Specimen Description BLOOD LEFT ANTECUBITAL  Final   Special Requests   Final    BOTTLES DRAWN AEROBIC AND ANAEROBIC Blood Culture adequate volume   Culture   Final    NO GROWTH 2 DAYS Performed at [facility], [place] [address] [place], [place] [address]    Report Status PENDING  Incomplete  Culture, blood (routine x 2)     Status: None (Preliminary result)   Collection Time: 05/14/20  4:12 PM   Specimen:  BLOOD LEFT FOREARM  Result Value Ref Range Status   Specimen Description BLOOD LEFT FOREARM  Final   Special Requests   Final    BOTTLES DRAWN AEROBIC ONLY Blood Culture results may not be optimal due to an inadequate volume of blood received in culture bottles   Culture   Final    NO GROWTH 2 DAYS Performed at [facility], [place] [address] [place], [place] [address]    Report Status PENDING  Incomplete    Pertinent Lab. CBC Latest Ref Rng & Units 05/15/2020 05/14/2020 05/13/2020  WBC 4.0 - 10.5 K/uL 7.0 2.9(L) -  Hemoglobin 12.0 - 15.0 g/dL 8.6(L) 8.9(L) 9.5(L)  Hematocrit 36.0 - 46.0 % 27.6(L) 26.6(L) 28.0(L)  Platelets 150 - 400 K/uL 185 135(L) -   CMP Latest Ref Rng & Units 05/16/2020 05/15/2020 05/14/2020  Glucose 70 - 99 mg/dL 61(L) 204(H) 139(H)  BUN 6 - 20 mg/dL 39(H) 35(H) 28(H)  Creatinine 0.44 - 1.00 mg/dL 2.37(H) 2.59(H) 1.89(H)  Sodium 135 - 145 mmol/L 137 137 136  Potassium 3.5 - 5.1 mmol/L 3.6 4.0 3.6  Chloride 98 - 111 mmol/L 100 102 100  CO2 22 - 32 mmol/L _0 Calcium 8.9 - 10.3 mg/dL 7.8(L) 7.8(L) 7.7(L)  Total Protein 6.5 - 8.1 g/dL 6.0(L) - 5.5(L)  Total Bilirubin 0.3 - 1.2 mg/dL 1.7(H) - 1.8(H)  Alkaline Phos 38 - 126 U/L 129(H) - 134(H)  AST 15 - 41 U/L 37 - 78(H)  ALT 0 - 44 U/L 44 - 77(H)     Pertinent Imaging today Plain films and CT images have been personally visualized and interpreted; radiology reports have been reviewed. Decision making incorporated into the Impression / Recommendations.  I have spent approx 30 minutes for this patient encounter including review of prior medical records with greater than 50% of time being face to face and coordination of their care.  Electronically signed by:   [name], MD Infectious Disease Physician [facility] for Infectious Disease Pager: [phone]

## 2020-05-16 NOTE — Progress Notes (Signed)
NAME:  [name], MRN:  [id number], DOB:  1991/03/09, LOS: 4 ADMISSION DATE:  05/12/2020, CONSULTATION DATE:  05/13/20 REFERRING MD:  [facility], CHIEF COMPLAINT:  Hypoxia   Brief History   [name] is a 30 yo female with a pPMH of T1DM, COVID 3x, HTN, CKD (Stage 3), and osteomyelitis s/p Rt ray amputation by Dr. Lajoyce Corners (04/13/20). Subsequently went to [facility] with pain in right foot where she underwent I&D and VAC placement and transferred to [facility]. Clinical course complicated by increased O2 demand on BIPAP resulting in intubation. CXR showed  extensive bilateral airspace disease 2/2 to aspiration vs infection.   Past Medical History   . Allergy   . Anemia   . Anxiety   . Blood transfusion without reported diagnosis    Dec 2018  . Cataract    right eye  . COVID-19 03/2020  . Depression   . Diabetes type 1, uncontrolled (HCC) 11/14/2011   Since age 1  . Fibromyalgia   . Gastroparesis   . GERD (gastroesophageal reflux disease)   . Hypertension   . Infection    UTI April 2016     Significant Hospital Events   05/13/2020 transferred to intensive care unit 05/13/2020 Bronchoscopy  Consults:  PCCM, ID, Ortho  Procedures:  02/13 Intubated>>02/15  Significant Diagnostic Tests:  02/13 CXR: The enteric tube projects over the gastric body.  02/14 CXR: 1. Unremarkable hardware. 2. Severe airspace disease with mildly improved lung volumes.   2/13 CXR: 1. Slight interval worsening of the bilateral hazy airspace opacities. 2. Support lines and tubes as above.  1. Significantly worsened diffuse bilateral airspace disease may reflect pneumonia, edema, or ARDS. 2. Unchanged borderline cardiomegaly.   02/13 ECHOCARDIOGRAM COMPLETE 1. Left ventricular ejection fraction, by estimation, is 60 to 65%. The left ventricle has normal function. The left ventricle has no regional wall motion abnormalities. There is mild concentric left ventricular hypertrophy. Left  ventricular diastolic parameters are indeterminate.   2. Right ventricular systolic function is normal. The right ventricular size is normal. There is moderately elevated pulmonary artery systolic pressure.   3. The mitral valve is normal in structure. Trivial mitral valve regurgitation.  4. Tricuspid valve regurgitation is mild to moderate.   5. The aortic valve is normal in structure. Aortic valve regurgitation is not visualized. No aortic stenosis is present.   6. The inferior vena cava is normal in size with greater than 50% respiratory variability, suggesting right atrial pressure of 3 mmHg.   02/14 CXR: 1. Unremarkable hardware. 2. Severe airspace disease with mildly improved lung volumes.  Micro Data:  05/12/2020 blood cultures x2 05/12/2020 Covid negative 2/13 Broch Alveolar Lavage - moderate WBC mostly PMN, no organisms  2/13 S pneumo - negative 2/13 BCID - negative  2/13 CDiff - negative   Antimicrobials:  05/12/2020 daptomycin - 05/14/2020 05/12/2020 Unasyn - 05/14/20 05/13/20 Linezolid >> 05/14/20 Zosyn >>  Previous surgical cultures positive for MRSA.  Interim history/subjective:    Overnight, patient was placed on BIPAP for hypoxia in the 70's while sleeping. She has a fever as high as 100.9 overnight. She also had an episode of hypoglycemia when tube feeds were discontinued.   On evaluation today, resting in bed. She states that she is having a lot of pain in her right foot. Otherwise denies any new complaints today.   Objective   Blood pressure (!) 107/54, pulse 90, temperature 100.3 F (37.9 C), temperature source Axillary, resp. rate (!) 23,  height 5\' 7"  (1.702 m), weight 80.3 kg, SpO2 95 %. CVP:  [23 mmHg] 23 mmHg  Vent Mode: BIPAP FiO2 (%):  [40 %-100 %] 100 % Set Rate:  [20 bmp] 20 bmp Vt Set:  [450 mL] 450 mL PEEP:  [5 cmH20] 5 cmH20 Pressure Support:  [5 cmH20] 5 cmH20 Plateau Pressure:  [24 cmH20] 24 cmH20   Intake/Output Summary (Last 24 hours) at  05/16/2020 0556 Last data filed at 05/16/2020 0315 Gross per 24 hour  Intake 2848.63 ml  Output 666 ml  Net 2182.63 ml   Filed Weights   05/14/20 0700 05/15/20 0500 05/16/20 0100  Weight: 83.3 kg 82.1 kg 80.3 kg    Examination: General: alert and oriented HENT: pupils are round and reactive, EOM intact Lungs: CTAB Cardiovascular: RRR no murmurs rubs or gallops Abdomen: soft BS+ Extremities: Right chronic shin wound and recent ray amputation with significant edema Neuro: neurovascularly intact   Resolved Hospital Problem list     Assessment & Plan:   Acute hypoxic respiratory failure, HAP vs aspiration  Bilateral airspace opacities, suspect ARDS. Hx of COVID-10 pneumonia, 04/05/2020, 09/24/2019, 05/21/2019 She was extubated yesterday. She required BIPAP overnight, but she has not been in respiratory distress. She spiked a fever overnight as high as 100.60F.  - continue Linezolid and Zosyn  (Day 4 of AB) - Continue BIPAP as needed.   Type I diabetic: Long-acting insulin d/c'd today due to hypoglycemia. Cont SSI as needed. She is NPO but passed her swallow eval.  - Will restart diet as soon as tolerated and then restart basal insulin - Cont SSI as needed. 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 04/15/2020) Subsequent I&D at [facility] with wound [facility] application Orthopedic surgery consulted for further evaluation and management. On broad spectrum AB. Did spike a fever overnight. Will monitor closely. - Continue antibiotics.  - Pain management with Oral Oxy 5 mg and fentanyl for breakthrough.   Acute on CKD (Stage 3), pre-renal Proteinuria: Oliguric:   Patient received IV lasix overnight with ~700 cc out. She has had improvement of her Cr 1.89>2.59>2.37.  - Avoid nephrotoxic medication when possible.  - No emergent needs for RRT - Will continue to monitor daily.   Protein caloric malnutrition: Generalized  weakness: Currently NPO, passed swallow eval. Will start diet today as tolerated. PT/OT consulted.  Best practice (evaluated daily)  Diet: NPO >clears Pain/Anxiety/Delirium protocol (if indicated):n/a VAP protocol (if indicated): n/a DVT prophylaxis: Subq heparin  GI prophylaxis: n/a Glucose control: ssi Mobility: PT/OT Disposition: icu  Goals of Care:  Last date of multidisciplinary goals of care discussion: n/a Family and staff present: patient s partner was at bed side. Discussed clinical situation with im today.  Summary of discussion: discuss current treatment plan Follow up goals of care discussion due: end of the week  Code Status: full   Labs   CBC: Recent Labs  Lab 05/12/20 0041 05/12/20 0935 05/12/20 1904 05/13/20 0530 05/13/20 2010 05/14/20 0530 05/15/20 0306  WBC 8.7 6.2  --  7.0  --  2.9* 7.0  NEUTROABS 7.2  --   --  6.0  --  2.1  --   HGB 3.7* 9.9* 9.2* 9.6* 9.5* 8.9* 8.6*  HCT 12.3* 31.1* 27.0* 28.4* 28.0* 26.6* 27.6*  MCV 93.9 91.5  --  88.2  --  89.6 91.1  PLT 147* 154  --  151  --  135* 185  Basic Metabolic Panel: Recent Labs  Lab 05/12/20 0041 05/12/20 0935 05/12/20 1904 05/13/20 0530 05/13/20 2010 05/14/20 0530 05/14/20 1819 05/15/20 0306  NA 135  --  135 137 137 136  --  137  K 3.9  --  3.8 3.8 3.9 3.6  --  4.0  CL 104  --   --  101  --  100  --  102  CO2 21*  --   --  24  --  24  --  22  GLUCOSE 270*  --   --  158*  --  139*  --  204*  BUN 27*  --   --  27*  --  28*  --  35*  CREATININE 1.78* 1.90*  --  1.90*  --  1.89*  --  2.59*  CALCIUM 7.9*  --   --  8.0*  --  7.7*  --  7.8*  MG  --   --   --  1.5*  --  1.8 1.7 1.9  PHOS  --   --   --   --   --  4.7* 5.1* 5.3*   GFR: Estimated Creatinine Clearance: 34.6 mL/min (A) (by C-G formula based on SCr of 2.59 mg/dL (H)). Recent Labs  Lab 05/12/20 0935 05/13/20 0530 05/14/20 0530 05/15/20 0306  PROCALCITON  --  10.74 14.62  --   WBC 6.2 7.0 2.9* 7.0    Liver Function  Tests: Recent Labs  Lab 05/12/20 0041 05/13/20 0530 05/14/20 0530  AST 100* 162* 78*  ALT 85* 103* 77*  ALKPHOS 120 192* 134*  BILITOT 0.5 2.1* 1.8*  PROT 5.8* 5.9* 5.5*  ALBUMIN 1.6* 1.4* 1.2*   No results for input(s): LIPASE, AMYLASE in the last 168 hours. No results for input(s): AMMONIA in the last 168 hours.  ABG    Component Value Date/Time   PHART 7.418 05/13/2020 2010   PCO2ART 43.7 05/13/2020 2010   PO2ART 147 (H) 05/13/2020 2010   HCO3 28.1 (H) 05/13/2020 2010   TCO2 29 05/13/2020 2010   ACIDBASEDEF 1.9 05/13/2020 0825   O2SAT 99.0 05/13/2020 2010     Coagulation Profile: No results for input(s): INR, PROTIME in the last 168 hours.  Cardiac Enzymes: No results for input(s): CKTOTAL, CKMB, CKMBINDEX, TROPONINI in the last 168 hours.  HbA1C: Hemoglobin A1C  Date/Time Value Ref Range Status  02/29/2020 03:58 PM 9.0 (A) 4.0 - 5.6 % Final   Hgb A1c MFr Bld  Date/Time Value Ref Range Status  04/10/2020 11:22 AM 8.6 (H) 4.8 - 5.6 % Final    Comment:    (NOTE) Pre diabetes:          5.7%-6.4%  Diabetes:              >6.4%  Glycemic control for   <7.0% adults with diabetes   09/25/2019 04:38 AM 8.3 (H) 4.8 - 5.6 % Final    Comment:    (NOTE) Pre diabetes:          5.7%-6.4%  Diabetes:              >6.4%  Glycemic control for   <7.0% adults with diabetes     CBG: Recent Labs  Lab 05/15/20 1900 05/15/20 1906 05/15/20 1951 05/15/20 2302 05/16/20 0303  GLUCAP 165* 299* 154* 132* 77    Critical care time:      Chari Manning, D.O.  Internal Medicine Resident, PGY-2 [facility]  Pager: [phone] 5:56  AM, 05/16/2020

## 2020-05-17 ENCOUNTER — Inpatient Hospital Stay (HOSPITAL_COMMUNITY): Payer: 59

## 2020-05-17 DIAGNOSIS — N179 Acute kidney failure, unspecified: Secondary | ICD-10-CM | POA: Diagnosis not present

## 2020-05-17 DIAGNOSIS — E440 Moderate protein-calorie malnutrition: Secondary | ICD-10-CM | POA: Insufficient documentation

## 2020-05-17 DIAGNOSIS — R0602 Shortness of breath: Secondary | ICD-10-CM | POA: Diagnosis not present

## 2020-05-17 DIAGNOSIS — J9601 Acute respiratory failure with hypoxia: Secondary | ICD-10-CM | POA: Diagnosis not present

## 2020-05-17 DIAGNOSIS — M86271 Subacute osteomyelitis, right ankle and foot: Secondary | ICD-10-CM | POA: Diagnosis not present

## 2020-05-17 LAB — POCT I-STAT 7, (LYTES, BLD GAS, ICA,H+H)
Acid-Base Excess: 2 mmol/L (ref 0.0–2.0)
Bicarbonate: 26.9 mmol/L (ref 20.0–28.0)
Calcium, Ion: 1.15 mmol/L (ref 1.15–1.40)
HCT: 22 % — ABNORMAL LOW (ref 36.0–46.0)
Hemoglobin: 7.5 g/dL — ABNORMAL LOW (ref 12.0–15.0)
O2 Saturation: 97 %
Patient temperature: 99.5
Potassium: 4.3 mmol/L (ref 3.5–5.1)
Sodium: 137 mmol/L (ref 135–145)
TCO2: 28 mmol/L (ref 22–32)
pCO2 arterial: 46.3 mmHg (ref 32.0–48.0)
pH, Arterial: 7.374 (ref 7.350–7.450)
pO2, Arterial: 92 mmHg (ref 83.0–108.0)

## 2020-05-17 LAB — BASIC METABOLIC PANEL
Anion gap: 13 (ref 5–15)
BUN: 41 mg/dL — ABNORMAL HIGH (ref 6–20)
CO2: 23 mmol/L (ref 22–32)
Calcium: 7.6 mg/dL — ABNORMAL LOW (ref 8.9–10.3)
Chloride: 99 mmol/L (ref 98–111)
Creatinine, Ser: 2.21 mg/dL — ABNORMAL HIGH (ref 0.44–1.00)
GFR, Estimated: 30 mL/min — ABNORMAL LOW (ref >60)
Glucose, Bld: 200 mg/dL — ABNORMAL HIGH (ref 70–99)
Potassium: 3.3 mmol/L — ABNORMAL LOW (ref 3.5–5.1)
Sodium: 135 mmol/L (ref 135–145)

## 2020-05-17 LAB — CBC
HCT: 24.5 % — ABNORMAL LOW (ref 36.0–46.0)
Hemoglobin: 7.6 g/dL — ABNORMAL LOW (ref 12.0–15.0)
MCH: 28.6 pg (ref 26.0–34.0)
MCHC: 31 g/dL (ref 30.0–36.0)
MCV: 92.1 fL (ref 80.0–100.0)
Platelets: 196 10*3/uL (ref 150–400)
RBC: 2.66 MIL/uL — ABNORMAL LOW (ref 3.87–5.11)
RDW: 15.2 % (ref 11.5–15.5)
WBC: 6.6 10*3/uL (ref 4.0–10.5)
nRBC: 0 % (ref 0.0–0.2)

## 2020-05-17 LAB — MAGNESIUM
Magnesium: 1.8 mg/dL (ref 1.7–2.4)
Magnesium: 2.1 mg/dL (ref 1.7–2.4)

## 2020-05-17 LAB — GLUCOSE, CAPILLARY
Glucose-Capillary: 189 mg/dL — ABNORMAL HIGH (ref 70–99)
Glucose-Capillary: 202 mg/dL — ABNORMAL HIGH (ref 70–99)
Glucose-Capillary: 183 mg/dL — ABNORMAL HIGH (ref 70–99)
Glucose-Capillary: 167 mg/dL — ABNORMAL HIGH (ref 70–99)
Glucose-Capillary: 135 mg/dL — ABNORMAL HIGH (ref 70–99)
Glucose-Capillary: 206 mg/dL — ABNORMAL HIGH (ref 70–99)

## 2020-05-17 LAB — PHOSPHORUS
Phosphorus: 4.6 mg/dL (ref 2.5–4.6)
Phosphorus: 4.1 mg/dL (ref 2.5–4.6)

## 2020-05-17 LAB — TRIGLYCERIDES: Triglycerides: 570 mg/dL — ABNORMAL HIGH (ref <150)

## 2020-05-17 LAB — HEPARIN LEVEL (UNFRACTIONATED)
Heparin Unfractionated: <0.1 IU/mL — ABNORMAL LOW (ref 0.30–0.70)
Heparin Unfractionated: 0.13 IU/mL — ABNORMAL LOW (ref 0.30–0.70)

## 2020-05-17 MED ORDER — MIDAZOLAM BOLUS VIA INFUSION
1.0000 mg | INTRAVENOUS | Status: DC | PRN
  Filled 2020-05-17: qty 2

## 2020-05-17 MED ORDER — POTASSIUM CHLORIDE 10 MEQ/50ML IV SOLN
10.0000 meq | INTRAVENOUS | Status: AC
  Administered 2020-05-17 (×3): 10 meq via INTRAVENOUS
  Filled 2020-05-17 (×4): qty 50

## 2020-05-17 MED ORDER — MAGNESIUM SULFATE 2 GM/50ML IV SOLN
2.0000 g | Freq: Once | INTRAVENOUS | Status: AC
  Administered 2020-05-17: 2 g via INTRAVENOUS
  Filled 2020-05-17: qty 50

## 2020-05-17 MED ORDER — DOCUSATE SODIUM 50 MG/5ML PO LIQD: 100.0000 mg | Freq: Two times a day (BID) | ORAL | Status: DC | PRN

## 2020-05-17 MED ORDER — POLYETHYLENE GLYCOL 3350 17 G PO PACK: 17.0000 g | PACK | Freq: Every day | ORAL | Status: DC | PRN

## 2020-05-17 MED ORDER — POTASSIUM CHLORIDE 20 MEQ PO PACK
20.0000 meq | PACK | ORAL | Status: AC
  Administered 2020-05-17 (×2): 20 meq
  Filled 2020-05-17 (×2): qty 1

## 2020-05-17 MED ORDER — INSULIN ASPART 100 UNIT/ML ~~LOC~~ SOLN
2.0000 [IU] | SUBCUTANEOUS | Status: DC
  Administered 2020-05-17 – 2020-05-18 (×5): 2 [IU] via SUBCUTANEOUS

## 2020-05-17 MED ORDER — INSULIN ASPART 100 UNIT/ML ~~LOC~~ SOLN: 4.0000 [IU] | SUBCUTANEOUS | Status: DC | PRN

## 2020-05-17 MED ORDER — HEPARIN BOLUS VIA INFUSION
2000.0000 [IU] | Freq: Once | INTRAVENOUS | Status: AC
  Administered 2020-05-17: 2000 [IU] via INTRAVENOUS
  Filled 2020-05-17: qty 2000

## 2020-05-17 MED ORDER — MIDAZOLAM 50MG/50ML (1MG/ML) PREMIX INFUSION
0.0000 mg/h | INTRAVENOUS | Status: DC
  Administered 2020-05-17: 1 mg/h via INTRAVENOUS
  Administered 2020-05-18: 1.5 mg/h via INTRAVENOUS
  Filled 2020-05-17 (×2): qty 50

## 2020-05-17 MED ORDER — HEPARIN SODIUM (PORCINE) 5000 UNIT/ML IJ SOLN
5000.0000 [IU] | Freq: Three times a day (TID) | INTRAMUSCULAR | Status: DC
  Administered 2020-05-17 – 2020-05-24 (×21): 5000 [IU] via SUBCUTANEOUS
  Filled 2020-05-17 (×21): qty 1

## 2020-05-17 MED ORDER — SODIUM CHLORIDE 0.9 % IV SOLN
2.0000 g | INTRAVENOUS | Status: DC
  Administered 2020-05-17 – 2020-05-24 (×8): 2 g via INTRAVENOUS
  Filled 2020-05-17 (×2): qty 2
  Filled 2020-05-17 (×2): qty 20
  Filled 2020-05-17: qty 2
  Filled 2020-05-17 (×3): qty 20

## 2020-05-17 MED ORDER — OXYCODONE HCL 5 MG/5ML PO SOLN
5.0000 mg | ORAL | Status: DC | PRN
  Administered 2020-05-19: 5 mg
  Filled 2020-05-17 (×2): qty 5

## 2020-05-17 MED ORDER — POTASSIUM CHLORIDE 10 MEQ/50ML IV SOLN
10.0000 meq | Freq: Once | INTRAVENOUS | Status: AC
  Administered 2020-05-17: 10 meq via INTRAVENOUS

## 2020-05-17 NOTE — Progress Notes (Signed)
Inpatient Diabetes Program Recommendations  AACE/ADA: New Consensus Statement on Inpatient Glycemic Control (2015)  Target Ranges:  Prepandial:   less than 140 mg/dL      Peak postprandial:   less than 180 mg/dL (1-2 hours)      Critically ill patients:  140 - 180 mg/dL   Lab Results  Component Value Date   GLUCAP 202 (H) 05/17/2020   HGBA1C 8.6 (H) 04/10/2020    Review of Glycemic Control Results for [name], [name] (MRN [id number]) as of 05/17/2020 11:13  Ref. Range 05/16/2020 15:06 05/16/2020 19:02 05/16/2020 19:45 05/16/2020 23:01 05/17/2020 03:02 05/17/2020 07:16  Glucose-Capillary Latest Ref Range: 70 - 99 mg/dL 65 (L) 69 (L) 646 (H) 803 (H) 189 (H) 202 (H)    History:Type 1 Diabetes  Home DM Meds:NPH Insulin 20 units QAM  Current Orders:Novolog 4 units Q4H PRN for tube feed coverage Novolog Moderate Correction Scale/ SSI (0-9 units)Q4H   Consider:  -Changing meal coverage to tube feed coverage now that tube feeds have been restarted: Novolog 2 units Q4H (to be stopped or held in the event tube feeds are stopped).  -With patient being sensitive to insulin, would change correction back to Novolog 0-6 units Q4H to prevent hypoglycemia. Secure chat sent to md. MD is aware of type 1 DM and that she eventually require basal.  Thanks, Lujean Rave, MSN, RNC-OB Diabetes Coordinator [phone] (8a-5p)

## 2020-05-17 NOTE — Progress Notes (Signed)
Upon assessment, patient had unequal pupils. Patient states this is her normal. Elink notified. No new orders at this time.

## 2020-05-17 NOTE — Progress Notes (Signed)
Bilateral lower extremity venous study completed.      Please see CV Proc for preliminary results.   Vonzell Schlatter, RVT

## 2020-05-17 NOTE — Progress Notes (Signed)
OT Cancellation Note  Patient Details Name: [name] MRN: [id number] DOB: 07/19/1990   Cancelled Treatment:    Reason Eval/Treat Not Completed: Patient not medically ready  Burnett Corrente Sakai Wolford, OT/L   Acute OT Clinical Specialist Acute Rehabilitation Services Pager [phone] Office [phone]  05/17/2020, 8:06 AM

## 2020-05-17 NOTE — Progress Notes (Signed)
Pharmacy Electrolyte Replacement  Recent Labs:  Recent Labs    05/17/20 0500  K 3.3*  MG 1.8  PHOS 4.6  CREATININE 2.21*    Plan:  -Mg 1.8, replace with 2g x1 -K 3.3, replace with 80 mEq (per tube and IV thru PICC)  Thank you,  Margarite Gouge, PharmD PGY2 ID Pharmacy Resident Phone between 7 am - 3:30 pm: [phone]  Please check AMION for all [facility] Pharmacy phone numbers After 10:00 PM, call Main Pharmacy [phone]

## 2020-05-17 NOTE — Progress Notes (Signed)
ANTICOAGULATION CONSULT NOTE - Follow Up Consult  Pharmacy Consult for heparin Indication: r/o PE  Labs: Recent Labs    05/14/20 0530 05/15/20 0306 05/16/20 0624 05/16/20 1111 05/17/20 0023  HGB 8.9* 8.6*  --  8.5*  --   HCT 26.6* 27.6*  --  25.0*  --   PLT 135* 185  --   --   --   HEPARINUNFRC  --   --   --   --  <0.10*  CREATININE 1.89* 2.59* 2.37*  --   --     Assessment: 29yo female subtherapeutic on heparin with initial dosing for concern for PE; no gtt issues or signs of bleeding per RN.  Goal of Therapy:  Heparin level 0.3-0.7 units/ml   Plan:  Will rebolus with heparin 2000 units and increase heparin gtt by 4 units/kg/hr to 1400 units/hr and check level in 8 hours.    Wynona Neat, PharmD, BCPS  05/17/2020,2:40 AM

## 2020-05-17 NOTE — Progress Notes (Signed)
PT Cancellation Note  Patient Details Name: [name] MRN: [id number] DOB: 1990-09-25   Cancelled Treatment:    Reason Eval/Treat Not Completed: Patient not medically ready (pt intubated with Fio2 80% and not yet medically appropriate)   Donie Lemelin B Tremond Shimabukuro 05/17/2020, 7:37 AM  Bayard Males, PT Acute Rehabilitation Services Pager: [phone] Office: [phone]

## 2020-05-17 NOTE — Progress Notes (Signed)
[facility] Infectious Diseases Follow Up Note  Patient Identification: Patient Name: [name] MRN: [id number] [place] Date: 05/12/2020 12:22 AM Age: 30 y.o.Today's Date: 05/17/2020   Reason for Visit: aspiration PNA and rt foot wound dehiscence   Principal Problem:   Chronic anemia Active Problems:   Hypertension associated with diabetes ([place])   Diabetes mellitus type 1 ([place])   Severe protein-calorie malnutrition ([place])   Osteomyelitis (HCC)   GI bleeding   AKI (acute kidney injury) ([place])   Diarrhea   Acute upper GI bleed   Respiratory failure ([place])   Malnutrition of moderate degree   Antibiotics:Unasyn 2/12- linezolid 2/13- c Daptomycin prior to admit - 2/12 Ciprofloxacin prior to admit - 2/12   Lines/Tubes:picc+   Interval Events: re-intubated yesterday. Fever curve is trending down. No leukocytosis. TTE with Grade 2 Diastolic dysfunction    Assessment # Acute respiratory failure 2/2 aspiration PNA with bilateral pulmonary infiltrates  - reintubated  #Wound dehiscence at the site of 1st ray amputation -DFU-osteo of 1st MTH of right foot s/p 1st ray amputation on 1/14 with residual Osteo and abscess s/p I and D with partial cuneiform bone removal - on dapto and cipro prior to admit with a plan to be taken until 3/16 -No plans for surgical intervention per Dr [name] -on antibiotics   #Medication monitoring - Cr stable, CBC with stable counts  #CKD # DM # PVD  Recommendations Continue Linezolid for now ( for rt foot wound dehiscence), avoiding Vancomycin due to AKI Will switch Unasyn to ceftriaxone for optimizing volume. Plan to treat for 7 days total ( including IV Unasyn and Zosyn) Fu blood cultures  Monitor CBC and BMP on IV antibiotics   Rest of the management as per the primary team. Thank you for the consult. Please page with  pertinent questions or concerns.  ______________________________________________________________________ Subjective patient seen and examined at the bedside. She is able to follow commands. Has some loose stools and feels fatigued. Husband at the bedside.    Vitals BP 113/65   Pulse 80   Temp 99.3 F (37.4 C) (Oral)   Resp 20   Ht _0  (1.702 m)   Wt 83.5 kg   SpO2 99%   BMI 28.83 kg/m     Physical Exam Intubated, awake and alert, able to follow commands HEENT WNL Chest - coarse breath sounds CVS- Normal s1s2, RRR Abdomen - soft  Extremities - minimal pedal edema, Rt 1st ray amputated site looks dry and necrotic Skin - no obvious rashes   Pertinent Microbiology  Results for orders placed or performed during the hospital encounter of 05/12/20  SARS CORONAVIRUS 2 (TAT 6-24 HRS) Nasopharyngeal Nasopharyngeal Swab     Status: None   Collection Time: 05/12/20  8:21 AM   Specimen: Nasopharyngeal Swab  Result Value Ref Range Status   SARS Coronavirus 2 NEGATIVE NEGATIVE Final    Comment: (NOTE) SARS-CoV-2 target nucleic acids are NOT DETECTED.  The SARS-CoV-2 RNA is generally detectable in upper and lower respiratory specimens during the acute phase of infection. Negative results do not preclude SARS-CoV-2 infection, do not rule out co-infections with other pathogens, and should not be used as the sole basis for treatment or other patient management decisions. Negative results must be combined with clinical observations, patient history, and epidemiological information. The expected result is Negative.  Fact Sheet for Patients: SugarRoll.be  Fact Sheet for Healthcare Providers: https://www.woods-mathews.com/  This test is not yet approved or cleared by the [place]  FDA and  has been authorized for detection and/or diagnosis of SARS-CoV-2 by FDA under an Emergency Use Authorization (EUA). This EUA will remain  in effect  (meaning this test can be used) for the duration of the COVID-19 declaration under Se ction 564(b)(1) of the Act, 21 U.S.C. section 360bbb-3(b)(1), unless the authorization is terminated or revoked sooner.  Performed at [facility], [place] [address], [place], [place] [address]   C Difficile Quick Screen w PCR reflex     Status: None   Collection Time: 05/13/20  6:27 AM   Specimen: STOOL  Result Value Ref Range Status   C Diff antigen NEGATIVE NEGATIVE Final   C Diff toxin NEGATIVE NEGATIVE Final   C Diff interpretation No C. difficile detected.  Final    Comment: Performed at [facility], [place] [address], [place], [place] [address]  Respiratory (~20 pathogens) panel by PCR     Status: None   Collection Time: 05/13/20  3:45 PM   Specimen: Nasopharyngeal Swab; Respiratory  Result Value Ref Range Status   Adenovirus NOT DETECTED NOT DETECTED Final   Coronavirus 229E NOT DETECTED NOT DETECTED Final    Comment: (NOTE) The Coronavirus on the Respiratory Panel, DOES NOT test for the novel  Coronavirus (2019 nCoV)    Coronavirus HKU1 NOT DETECTED NOT DETECTED Final   Coronavirus NL63 NOT DETECTED NOT DETECTED Final   Coronavirus OC43 NOT DETECTED NOT DETECTED Final   Metapneumovirus NOT DETECTED NOT DETECTED Final   Rhinovirus / Enterovirus NOT DETECTED NOT DETECTED Final   Influenza A NOT DETECTED NOT DETECTED Final   Influenza B NOT DETECTED NOT DETECTED Final   Parainfluenza Virus 1 NOT DETECTED NOT DETECTED Final   Parainfluenza Virus 2 NOT DETECTED NOT DETECTED Final   Parainfluenza Virus 3 NOT DETECTED NOT DETECTED Final   Parainfluenza Virus 4 NOT DETECTED NOT DETECTED Final   Respiratory Syncytial Virus NOT DETECTED NOT DETECTED Final   Bordetella pertussis NOT DETECTED NOT DETECTED Final   Bordetella Parapertussis NOT DETECTED NOT DETECTED Final   Chlamydophila pneumoniae NOT DETECTED NOT DETECTED Final   Mycoplasma pneumoniae NOT DETECTED NOT DETECTED Final     Comment: Performed at [facility], [place]. [address] [place], [place] [address]  Culture, respiratory     Status: None   Collection Time: 05/13/20  6:04 PM   Specimen: Bronchoalveolar Lavage; Respiratory  Result Value Ref Range Status   Specimen Description BRONCHIAL ALVEOLAR LAVAGE  Final   Special Requests NONE  Final   Gram Stain   Final    MODERATE WBC PRESENT, PREDOMINANTLY PMN NO ORGANISMS SEEN    Culture   Final    NO GROWTH Performed at [facility], [address] [place],  [address]    Report Status 05/16/2020 FINAL  Final  Acid Fast Smear (AFB)     Status: None   Collection Time: 05/13/20  6:04 PM   Specimen: Bronchial Alveolar Lavage  Result Value Ref Range Status   AFB Specimen Processing Concentration  Final   Acid Fast Smear Negative  Final    Comment: (NOTE) Performed At: [facility] [place], [place] [id number] Rush Farmer MD [id number]    Source (AFB) BRONCHIAL ALVEOLAR LAVAGE  Final    Comment: Performed at [facility], [place] [address], [place],  [address]  Fungus Culture With Stain     Status: None (Preliminary result)   Collection Time: 05/13/20  6:04 PM   Specimen: Bronchial Alveolar Lavage  Result Value Ref Range Status   Fungus Stain Final report  Final    Comment: (NOTE) Performed At: [facility] [place], [place] [id number] Rush Farmer MD WU:[id number]    Fungus (Mycology) Culture PENDING  Incomplete   Fungal Source BRONCHIAL ALVEOLAR LAVAGE  Final    Comment: Performed at [facility], [place] [address], [place], [place] [address]  Fungus Culture Result     Status: None   Collection Time: 05/13/20  6:04 PM  Result Value Ref Range Status   Result 1 Comment  Final    Comment: (NOTE) KOH/Calcofluor preparation:  no fungus observed. Performed At: [facility] [place], [place] [id number] Rush Farmer MD NG:[id number]   Culture,  blood (routine x 2)     Status: None (Preliminary result)   Collection Time: 05/14/20  4:06 PM   Specimen: BLOOD  Result Value Ref Range Status   Specimen Description BLOOD LEFT ANTECUBITAL  Final   Special Requests   Final    BOTTLES DRAWN AEROBIC AND ANAEROBIC Blood Culture adequate volume   Culture   Final    NO GROWTH 3 DAYS Performed at [facility], [address] [place], [place] [address]    Report Status PENDING  Incomplete  Culture, blood (routine x 2)     Status: None (Preliminary result)   Collection Time: 05/14/20  4:12 PM   Specimen: BLOOD LEFT FOREARM  Result Value Ref Range Status   Specimen Description BLOOD LEFT FOREARM  Final   Special Requests   Final    BOTTLES DRAWN AEROBIC ONLY Blood Culture results may not be optimal due to an inadequate volume of blood received in culture bottles   Culture   Final    NO GROWTH 3 DAYS Performed at [facility], [place] [address], [place], [place] [address]    Report Status PENDING  Incomplete    Pertinent Lab. CBC Latest Ref Rng & Units 05/17/2020 05/16/2020 05/15/2020  WBC 4.0 - 10.5 K/uL 6.6 - 7.0  Hemoglobin 12.0 - 15.0 g/dL 7.6(L) 8.5(L) 8.6(L)  Hematocrit 36.0 - 46.0 % 24.5(L) 25.0(L) 27.6(L)  Platelets 150 - 400 K/uL 196 - 185   CMP Latest Ref Rng & Units 05/17/2020 05/16/2020 05/16/2020  Glucose 70 - 99 mg/dL 200(H) - 61(L)  BUN 6 - 20 mg/dL 41(H) - 39(H)  Creatinine 0.44 - 1.00 mg/dL 2.21(H) - 2.37(H)  Sodium 135 - 145 mmol/L 135 137 137  Potassium 3.5 - 5.1 mmol/L 3.3(L) 3.7 3.6  Chloride 98 - 111 mmol/L 99 - 100  CO2 22 - 32 mmol/L 23 - 23  Calcium 8.9 - 10.3 mg/dL 7.6(L) - 7.8(L)  Total Protein 6.5 - 8.1 g/dL - - 6.0(L)  Total Bilirubin 0.3 - 1.2 mg/dL - - 1.7(H)  Alkaline Phos 38 - 126 U/L - - 129(H)  AST 15 - 41 U/L - - 37  ALT 0 - 44 U/L - - 44     Pertinent Imaging today Plain films and CT images have been personally visualized and interpreted; radiology reports have been reviewed. Decision  making incorporated into the Impression / Recommendations.  I have spent approx 30 minutes for this patient encounter including review of prior medical records with greater than 50% of time being face to face and coordination of their care.  Electronically signed by:   [name], MD Infectious Disease Physician [facility]  Center for Infectious Disease Pager: [phone]

## 2020-05-17 NOTE — Progress Notes (Addendum)
NAME:  [name], MRN:  [id number], DOB:  05/08/1990, LOS: 5 ADMISSION DATE:  05/12/2020, CONSULTATION DATE:  05/13/20 REFERRING MD:  Triad, CHIEF COMPLAINT:  Hypoxia   Brief History   [name] is a 30 yo female with a pPMH of T1DM, COVID 3x, HTN, CKD (Stage 3), and osteomyelitis s/p Rt ray amputation by Dr. Lajoyce Corners (04/13/20). Subsequently went to [facility] with pain in right foot where she underwent I&D and VAC placement and transferred to [facility]. Clinical course complicated by increased O2 demand on BIPAP resulting in intubation. CXR showed  extensive bilateral airspace disease 2/2 to aspiration vs infection.   Past Medical History   . Allergy   . Anemia   . Anxiety   . Blood transfusion without reported diagnosis    Dec 2018  . Cataract    right eye  . COVID-19 03/2020  . Depression   . Diabetes type 1, uncontrolled (HCC) 11/14/2011   Since age 71  . Fibromyalgia   . Gastroparesis   . GERD (gastroesophageal reflux disease)   . Hypertension   . Infection    UTI April 2016     Significant Hospital Events   05/13/2020 transferred to intensive care unit 05/13/2020 intubated 05/13/2020 Bronchoscopy 05/25/20 extubated 05/16/20 re-intubated due to hypoxia and bradycardia  Consults:  PCCM, ID, Ortho  Procedures:  02/13 Intubated - 02/15 02/16 Re-intubated>>  Significant Diagnostic Tests:  02/13 CXR: The enteric tube projects over the gastric body.  02/14 CXR: 1. Unremarkable hardware. 2. Severe airspace disease with mildly improved lung volumes.   2/13 CXR: 1. Slight interval worsening of the bilateral hazy airspace opacities. 2. Support lines and tubes as above.  1. Significantly worsened diffuse bilateral airspace disease may reflect pneumonia, edema, or ARDS. 2. Unchanged borderline cardiomegaly.   02/13 ECHOCARDIOGRAM COMPLETE 1. Left ventricular ejection fraction, by estimation, is 60 to 65%. The left ventricle has normal function. The  left ventricle has no regional wall motion abnormalities. There is mild concentric left ventricular hypertrophy. Left ventricular diastolic parameters are indeterminate.   2. Right ventricular systolic function is normal. The right ventricular size is normal. There is moderately elevated pulmonary artery systolic pressure.   3. The mitral valve is normal in structure. Trivial mitral valve regurgitation.  4. Tricuspid valve regurgitation is mild to moderate.   5. The aortic valve is normal in structure. Aortic valve regurgitation is not visualized. No aortic stenosis is present.   6. The inferior vena cava is normal in size with greater than 50% respiratory variability, suggesting right atrial pressure of 3 mmHg.   02/14 CXR: 1. Unremarkable hardware. 2. Severe airspace disease with mildly improved lung volumes.  02/16 CXR: 1. Tube and catheter positions as described without evident pneumothorax.  2. Multifocal airspace opacity with less opacity in the right apex compared to previous study and multifocal airspace opacity elsewhere essentially stable. No new opacity evident. Appearance consistent with multifocal pneumonia. Correlation with COVID-19 status may be advisable.  3. Note that noncardiogenic edema and ARDS are differential considerations. More than one of these entities may present concurrently. Stable cardiac silhouette.  2/16 Abd XR: OG tube tip in the stomach.   Micro Data:  05/12/2020 blood cultures x2 05/12/2020 Covid negative 2/13 Broch Alveolar Lavage - moderate WBC mostly PMN, no organisms  2/13 S pneumo - negative 2/13 BCID - negative  2/13 CDiff - negative  2/14 BCx - NG 2d 2/16 Tracheal aspiration - pending Antimicrobials:  05/12/2020 daptomycin -  05/14/2020 05/12/2020 Unasyn - 05/14/20 05/13/20 Linezolid >> 05/14/20 Zosyn - 2/15 02/17 Unasyn >> Previous surgical cultures positive for MRSA.   Interim history/subjective:    No events overnight. She was  re-intubated yesterday.   On evaluation today, resting in bed with ET tube in place denies any acute pain or complaints. She is able to follow commands and appears sedated.   Objective   Blood pressure 109/64, pulse 82, temperature 98.5 F (36.9 C), temperature source Oral, resp. rate 18, height 5\' 7"  (1.702 m), weight 83.5 kg, SpO2 99 %.    Vent Mode: PRVC FiO2 (%):  [60 %-100 %] 90 % Set Rate:  [20 bmp] 20 bmp Vt Set:  [450 mL] 450 mL PEEP:  [5 cmH20-10 cmH20] 10 cmH20 Plateau Pressure:  [23 cmH20-26 cmH20] 23 cmH20   Intake/Output Summary (Last 24 hours) at 05/17/2020 0556 Last data filed at 05/17/2020 0510 Gross per 24 hour  Intake 3020.46 ml  Output 2300 ml  Net 720.46 ml   Filed Weights   05/15/20 0500 05/16/20 0100 05/17/20 0334  Weight: 82.1 kg 80.3 kg 83.5 kg    Examination: General: alert and oriented, but sedated with ET tube in place HENT: pupils are round and reactive, EOM intact Lungs: Increased rales bilaterally, decreased lung sounds in bases. Increased respiratory secretions  Cardiovascular: RRR no murmurs rubs or gallops Abdomen: soft BS+ Extremities: Right chronic shin wound and recent ray amputation with significant edema. 2+ pitting edema in all extremities.  Neuro: neurovascularly intact   Resolved Hospital Problem list     Assessment & Plan:   Acute hypoxic respiratory failure, HAP vs aspiration  Bilateral airspace opacities, suspect ARDS. Hx of COVID-10 pneumonia, 04/05/2020, 09/24/2019, 05/21/2019 She was extubated yesterday and subsequently developed hypoxia, bradycardia, and altered mental status requiring re-intubation. Yesterday was requiring high FiO2 up to 100% to maintain O2 sats>90%. Sedation was increased and her O2 sats have improved. She was having thick secretions concerning for worsening underlying pneumonia vs new aspiration. Patient was started on heparin fdue to concern for underlying PE. D-dimer was elevated but this could be confounded  by recent COVID infection. Echo was WNL and no sign of right heart strain.  - Continue Linezolid and unasyn for pneumonia (Day 5 AB) - VAP protocol - GI protection with PPI - SBT/SAT when possible - PAD protocol with fentanyl  - Trach aspiration pending  Type I diabetic: Continue current DM management will adjust as needed to maintain goal CBG. - Cont SSI as needed. 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 04/15/2020) Subsequent I&D at [facility] with wound [facility] application Orthopedic surgery consulted for further evaluation and management. On broad spectrum AB.  - Continue antibiotics.  - Continue pain management with fentanyl   Acute on CKD (Stage 3), pre-renal Proteinuria: Oliguric:   Patient received IV lasix overnight with ~ 2000cc out. She has had improvement of her Cr 1.89>2.59>2.37>2.21.  - Avoid nephrotoxic medication when possible.  - No emergent needs for RRT - Will continue to monitor daily.   Hypervolemia: Patient appears hypervolemic on exam. Receiving diuretics with good output, but still net positive in 24 hours. BNP elevated, Echo WNL. - continue lasix  Protein caloric malnutrition: Generalized weakness: Will restart Tube feeds.   Best practice (evaluated daily)  Diet: TF Pain/Anxiety/Delirium protocol (if indicated): indicated VAP protocol (if indicated): indicated DVT prophylaxis: heparin gtt GI prophylaxis: ppi Glucose control: ssi Mobility: PT/OT Disposition:  icu  Goals of Care:  Last date of multidisciplinary goals of care discussion: n/a Family and staff present: patient s partner was at bed side. Discussed clinical situation with im today.  Summary of discussion: discuss current treatment plan Follow up goals of care discussion due: end of the week  Code Status: full   Labs   CBC: Recent Labs  Lab 05/12/20 0041 05/12/20 0935 05/12/20 1904 05/13/20 0530 05/13/20 2010 05/14/20 0530  05/15/20 0306 05/16/20 1111  WBC 8.7 6.2  --  7.0  --  2.9* 7.0  --   NEUTROABS 7.2  --   --  6.0  --  2.1  --   --   HGB 3.7* 9.9*   < > 9.6* 9.5* 8.9* 8.6* 8.5*  HCT 12.3* 31.1*   < > 28.4* 28.0* 26.6* 27.6* 25.0*  MCV 93.9 91.5  --  88.2  --  89.6 91.1  --   PLT 147* 154  --  151  --  135* 185  --    < > = values in this interval not displayed.    Basic Metabolic Panel: Recent Labs  Lab 05/12/20 0041 05/12/20 0935 05/12/20 1904 05/13/20 0530 05/13/20 0530 05/13/20 2010 05/14/20 0530 05/14/20 1819 05/15/20 0306 05/16/20 0624 05/16/20 1111 05/16/20 1131 05/16/20 1509  NA 135  --    < > 137  --  137 136  --  137 137 137  --   --   K 3.9  --    < > 3.8  --  3.9 3.6  --  4.0 3.6 3.7  --   --   CL 104  --   --  101  --   --  100  --  102 100  --   --   --   CO2 21*  --   --  24  --   --  24  --  22 23  --   --   --   GLUCOSE 270*  --   --  158*  --   --  139*  --  204* 61*  --   --   --   BUN 27*  --   --  27*  --   --  28*  --  35* 39*  --   --   --   CREATININE 1.78* 1.90*  --  1.90*  --   --  1.89*  --  2.59* 2.37*  --   --   --   CALCIUM 7.9*  --   --  8.0*  --   --  7.7*  --  7.8* 7.8*  --   --   --   MG  --   --    < > 1.5*  --   --  1.8 1.7 1.9 1.8  --  1.8 1.7  PHOS  --   --   --   --    < >  --  4.7* 5.1* 5.3* 4.8*  --  5.0* 4.3   < > = values in this interval not displayed.   GFR: Estimated Creatinine Clearance: 38.6 mL/min (A) (by C-G formula based on SCr of 2.37 mg/dL (H)). Recent Labs  Lab 05/12/20 0935 05/13/20 0530 05/14/20 0530 05/15/20 0306  PROCALCITON  --  10.74 14.62  --   WBC 6.2 7.0 2.9* 7.0    Liver Function Tests: Recent Labs  Lab 05/12/20 0041 05/13/20 0530 05/14/20 0530 05/16/20 0624  AST 100*  162* 78* 37  ALT 85* 103* 77* 44  ALKPHOS 120 192* 134* 129*  BILITOT 0.5 2.1* 1.8* 1.7*  PROT 5.8* 5.9* 5.5* 6.0*  ALBUMIN 1.6* 1.4* 1.2* 1.1*   No results for input(s): LIPASE, AMYLASE in the last 168 hours. No results for input(s):  AMMONIA in the last 168 hours.  ABG    Component Value Date/Time   PHART 7.424 05/16/2020 1111   PCO2ART 39.0 05/16/2020 1111   PO2ART 80 (L) 05/16/2020 1111   HCO3 25.5 05/16/2020 1111   TCO2 27 05/16/2020 1111   ACIDBASEDEF 1.9 05/13/2020 0825   O2SAT 96.0 05/16/2020 1111     Coagulation Profile: No results for input(s): INR, PROTIME in the last 168 hours.  Cardiac Enzymes: No results for input(s): CKTOTAL, CKMB, CKMBINDEX, TROPONINI in the last 168 hours.  HbA1C: Hemoglobin A1C  Date/Time Value Ref Range Status  02/29/2020 03:58 PM 9.0 (A) 4.0 - 5.6 % Final   Hgb A1c MFr Bld  Date/Time Value Ref Range Status  04/10/2020 11:22 AM 8.6 (H) 4.8 - 5.6 % Final    Comment:    (NOTE) Pre diabetes:          5.7%-6.4%  Diabetes:              >6.4%  Glycemic control for   <7.0% adults with diabetes   09/25/2019 04:38 AM 8.3 (H) 4.8 - 5.6 % Final    Comment:    (NOTE) Pre diabetes:          5.7%-6.4%  Diabetes:              >6.4%  Glycemic control for   <7.0% adults with diabetes     CBG: Recent Labs  Lab 05/16/20 1506 05/16/20 1902 05/16/20 1945 05/16/20 2301 05/17/20 0302  GLUCAP 65* 69* 121* 179* 189*    Critical care time:      Chari Manning, D.O.  Internal Medicine Resident, PGY-2 [facility]  Pager: [phone] 5:56 AM, 05/17/2020

## 2020-05-18 ENCOUNTER — Inpatient Hospital Stay (HOSPITAL_COMMUNITY): Payer: 59

## 2020-05-18 DIAGNOSIS — J80 Acute respiratory distress syndrome: Secondary | ICD-10-CM

## 2020-05-18 DIAGNOSIS — D649 Anemia, unspecified: Secondary | ICD-10-CM | POA: Diagnosis not present

## 2020-05-18 DIAGNOSIS — M86171 Other acute osteomyelitis, right ankle and foot: Secondary | ICD-10-CM | POA: Diagnosis not present

## 2020-05-18 DIAGNOSIS — T8781 Dehiscence of amputation stump: Secondary | ICD-10-CM | POA: Diagnosis not present

## 2020-05-18 DIAGNOSIS — E1069 Type 1 diabetes mellitus with other specified complication: Secondary | ICD-10-CM | POA: Diagnosis not present

## 2020-05-18 DIAGNOSIS — N179 Acute kidney failure, unspecified: Secondary | ICD-10-CM | POA: Diagnosis not present

## 2020-05-18 LAB — CBC
HCT: 24.1 % — ABNORMAL LOW (ref 36.0–46.0)
Hemoglobin: 7.3 g/dL — ABNORMAL LOW (ref 12.0–15.0)
MCH: 28.4 pg (ref 26.0–34.0)
MCHC: 30.3 g/dL (ref 30.0–36.0)
MCV: 93.8 fL (ref 80.0–100.0)
Platelets: 180 10*3/uL (ref 150–400)
RBC: 2.57 MIL/uL — ABNORMAL LOW (ref 3.87–5.11)
RDW: 15.2 % (ref 11.5–15.5)
WBC: 7 10*3/uL (ref 4.0–10.5)
nRBC: 0 % (ref 0.0–0.2)

## 2020-05-18 LAB — BASIC METABOLIC PANEL
Anion gap: 8 (ref 5–15)
BUN: 43 mg/dL — ABNORMAL HIGH (ref 6–20)
CO2: 25 mmol/L (ref 22–32)
Calcium: 7.8 mg/dL — ABNORMAL LOW (ref 8.9–10.3)
Chloride: 105 mmol/L (ref 98–111)
Creatinine, Ser: 1.86 mg/dL — ABNORMAL HIGH (ref 0.44–1.00)
GFR, Estimated: 37 mL/min — ABNORMAL LOW (ref >60)
Glucose, Bld: 107 mg/dL — ABNORMAL HIGH (ref 70–99)
Potassium: 3.5 mmol/L (ref 3.5–5.1)
Sodium: 138 mmol/L (ref 135–145)

## 2020-05-18 LAB — GLUCOSE, CAPILLARY
Glucose-Capillary: 154 mg/dL — ABNORMAL HIGH (ref 70–99)
Glucose-Capillary: 113 mg/dL — ABNORMAL HIGH (ref 70–99)
Glucose-Capillary: 148 mg/dL — ABNORMAL HIGH (ref 70–99)
Glucose-Capillary: 145 mg/dL — ABNORMAL HIGH (ref 70–99)
Glucose-Capillary: 149 mg/dL — ABNORMAL HIGH (ref 70–99)

## 2020-05-18 LAB — PROCALCITONIN: Procalcitonin: 5.67 ng/mL

## 2020-05-18 LAB — C-REACTIVE PROTEIN: CRP: 18.8 mg/dL — ABNORMAL HIGH (ref <1.0)

## 2020-05-18 LAB — MAGNESIUM: Magnesium: 2.1 mg/dL (ref 1.7–2.4)

## 2020-05-18 IMAGING — DX DG CHEST 1V PORT
1 series · 1 of 1 positions shown · non-contrast
Comparison: [DATE]

CLINICAL DATA: Hypoxia

EXAM:
PORTABLE CHEST 1 VIEW

[chest]
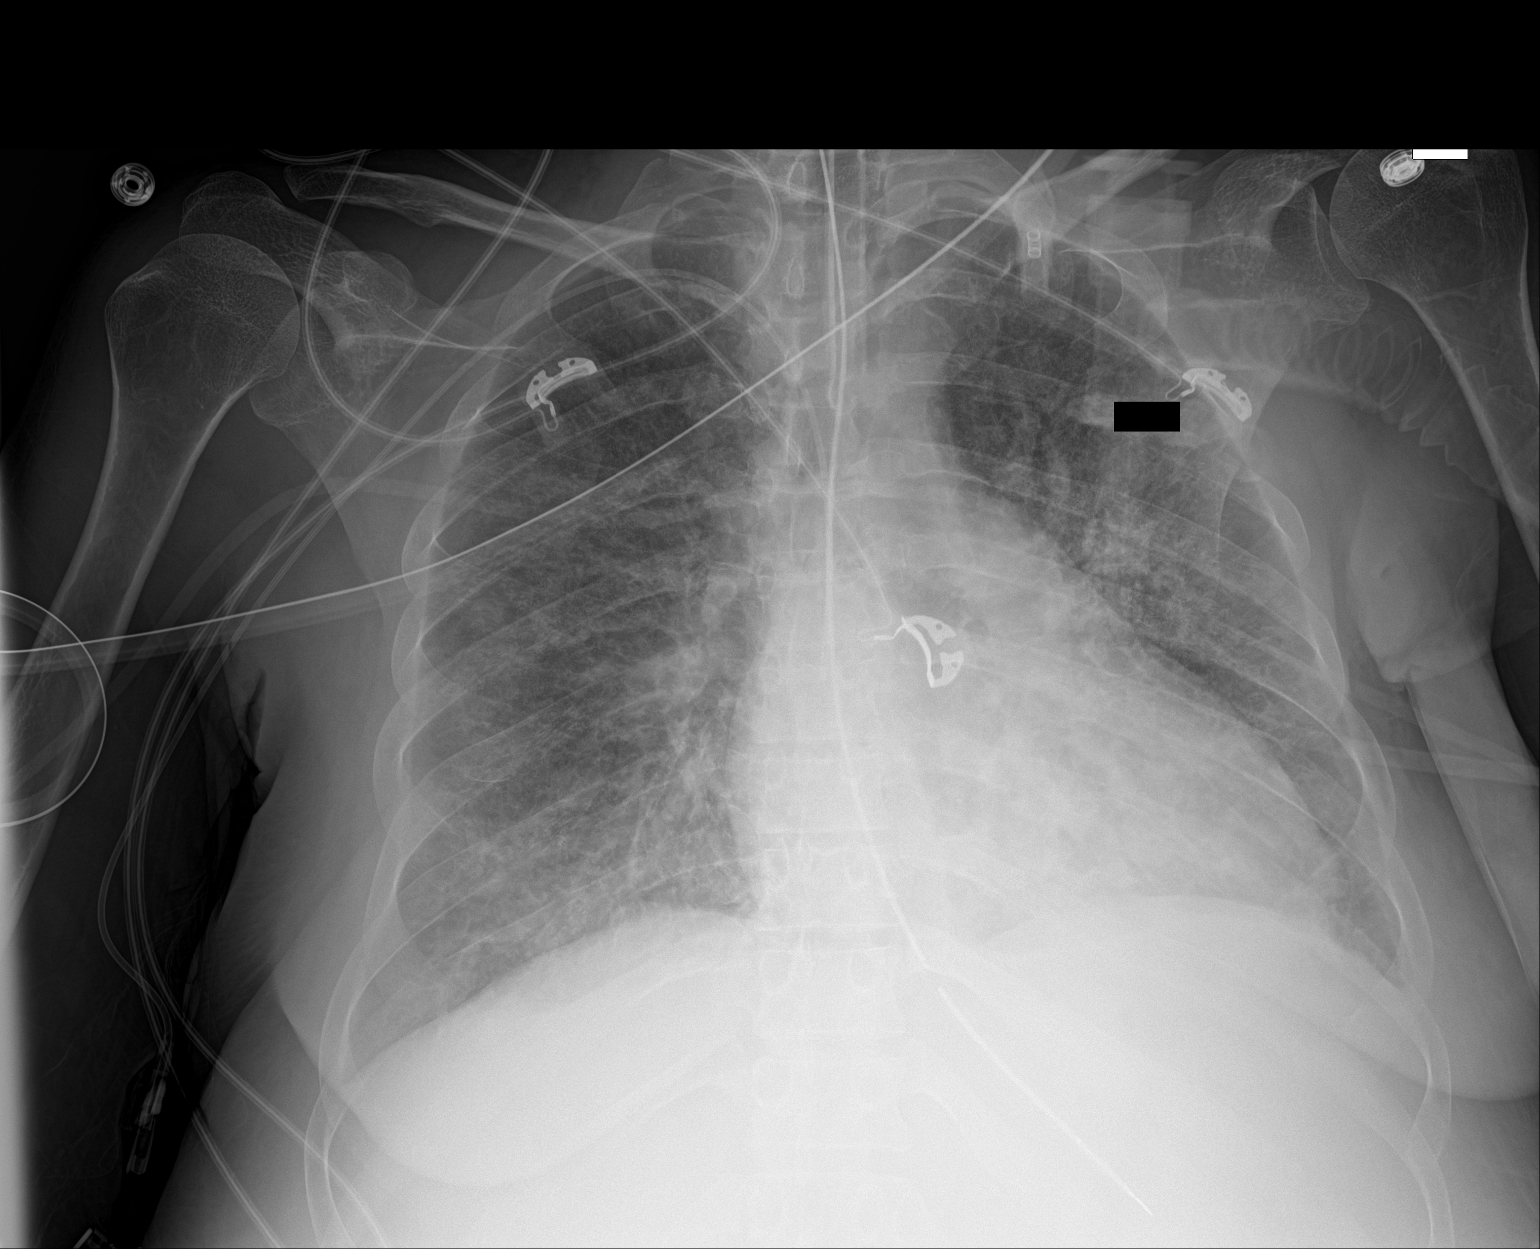

[1 of 1 positions shown; findings below may reference images not displayed]

FINDINGS: Endotracheal tube tip is 3.8 cm above the carina. Nasogastric tube
tip is in the stomach with the side port at the gastroesophageal
junction. Central catheter tip is in the superior vena cava. No
pneumothorax. There is ill-defined airspace opacity throughout the
lungs bilaterally, slightly less apparent compared to 2 days prior.
A slight degree of consolidation is noted in the medial left base.
No consolidation elsewhere heart remains upper normal in size with
pulmonary vascularity within normal limits. No adenopathy. No bone
lesions.
IMPRESSION: Tube and catheter positions as described without pneumothorax. Note
that the nasogastric tube side port is at the gastroesophageal
junction. It may be prudent to consider advancing nasogastric tube
4-5 cm.

Multifocal airspace opacity slightly less prominent than on previous
study. Currently there is mild consolidation in the medial left base
with no consolidation elsewhere. Stable cardiac silhouette.

## 2020-05-18 MED ORDER — ALTEPLASE 2 MG IJ SOLR
2.0000 mg | Freq: Once | INTRAMUSCULAR | Status: AC
  Administered 2020-05-18: 2 mg
  Filled 2020-05-18: qty 2

## 2020-05-18 MED ORDER — POTASSIUM CHLORIDE 20 MEQ PO PACK
40.0000 meq | PACK | Freq: Once | ORAL | Status: AC
  Administered 2020-05-18: 40 meq
  Filled 2020-05-18: qty 2

## 2020-05-18 MED ORDER — INSULIN ASPART 100 UNIT/ML ~~LOC~~ SOLN
3.0000 [IU] | SUBCUTANEOUS | Status: DC
  Administered 2020-05-18 – 2020-05-19 (×7): 3 [IU] via SUBCUTANEOUS

## 2020-05-18 NOTE — Progress Notes (Signed)
Peculiar Progress Note Patient Name: [name] DOB: [birth year] MRN: [id number]   Date of Service  05/18/2020  HPI/Events of Note  Patient has a stage 2 sacral skin wound and frequent, watery stools.  eICU Interventions  Flexiseal ordered.        Lebron Nauert U Liviah Cake 05/18/2020, 1:50 AM

## 2020-05-18 NOTE — Evaluation (Signed)
Physical Therapy Evaluation Patient Details Name: [name] MRN: [id number] DOB: 18-Feb-1991 Today's Date: 05/18/2020   History of Present Illness  30 yo female admitted 2/12 after found unresponsive at home by spouse. Pt intubated 2/13-2/15, reintubated 2/16. PMHx: Pt with recent admit to [facility] 1/28-2/9 for I&D and VAc of right foot. [facility] admit 1/10-1/17 with osteomyelitis and Rt ray amputation by Dr.Duda 1/14. DM, Covid 3x, HTN, CKD, anemia, anxiety  Clinical Impression  Pt pleasant and able to mobilize to EOB. Spouse present to provide home setup and PLOF and pt communicating via ipad. 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 Pt with decreased strength, transfers and function with pt having been unable to ambulate and only transfers since 1/14 amputation. Pt will benefit from acute therapy to maximize mobility, safety and function to decrease burden of care.   CPAP/PS Fio2 40%, peep 5 with RR 20 SpO2 91-97% with activity with brief drop to 82% with initial roll prior to suctioning BP supine 100/53 (68), sitting EOB 111/64 (78)    Follow Up Recommendations Home health PT    Equipment Recommendations  3in1 (PT)    Recommendations for Other Services       Precautions / Restrictions Precautions Precautions: Other (comment) Precaution Comments: sacral wound, ETT, vent, bil prevalon Restrictions RLE Weight Bearing: Non weight bearing      Mobility  Bed Mobility Overal bed mobility: Needs Assistance Bed Mobility: Supine to Sit;Sit to Supine;Rolling Rolling: Min assist   Supine to sit: +2 for safety/equipment;Mod assist Sit to supine: Min assist;+2 for safety/equipment   General bed mobility comments: cues for sequence to bend knee and roll toward left with rail. Initial roll to left pt with desaturation to 82% with RT present to suction and pt returned to supine. 2nd trial pt able to roll to left and transition to EOb with limited assist to move  legs and cues for upper body. Return to bed pt able to control trunk with min assist of bil LE    Transfers Overall transfer level: Needs assistance   Transfers: Lateral/Scoot Transfers          Lateral/Scoot Transfers: Min assist General transfer comment: min assist with RLE blocked into NWB pt able to scoot toward HOB at EOB with assist of pad and cues  Ambulation/Gait                Stairs            Wheelchair Mobility    Modified Rankin (Stroke Patients Only)       Balance Overall balance assessment: Needs assistance   Sitting balance-Leahy Scale: Fair Sitting balance - Comments: without and with UE support EOB 7 min                                     Pertinent Vitals/Pain Pain Assessment: 0-10 Pain Score: 4  Pain Location: sacrum, chest Pain Intervention(s): Limited activity within patient's tolerance;Repositioned    Home Living Family/patient expects to be discharged to:: Private residence Living Arrangements: Spouse/significant other;Children   Type of Home: House Home Access: Stairs to enter   Technical brewer of Steps: 4 Home Layout: One level Home Equipment: Environmental consultant - 2 wheels;Wheelchair - Brewing technologist - built in      Prior Function Level of Independence: Needs assistance   Gait / Transfers Assistance Needed: hopping limited transfers,  WC in house  ADL's / Homemaking Assistance Needed: sponge bathing, dressing on her own, have outdoor seat in shower        Hand Dominance        Extremity/Trunk Assessment   Upper Extremity Assessment Upper Extremity Assessment: Defer to OT evaluation    Lower Extremity Assessment Lower Extremity Assessment: Generalized weakness    Cervical / Trunk Assessment Cervical / Trunk Assessment: Other exceptions Cervical / Trunk Exceptions: rounded shoulders, tendency for neck flexion and left rotation (toward vent)  Communication   Communication: Other (comment)  (ETT, vent)  Cognition Arousal/Alertness: Awake/alert Behavior During Therapy: WFL for tasks assessed/performed Overall Cognitive Status: Impaired/Different from baseline Area of Impairment: Safety/judgement                         Safety/Judgement: Decreased awareness of deficits            General Comments      Exercises General Exercises - Lower Extremity Long Arc Quad: AROM;Both;5 reps;Seated   Assessment/Plan    PT Assessment Patient needs continued PT services  PT Problem List Decreased strength;Decreased mobility;Decreased activity tolerance;Cardiopulmonary status limiting activity       PT Treatment Interventions DME instruction;Therapeutic exercise;Functional mobility training;Therapeutic activities;Patient/family education    PT Goals (Current goals can be found in the Care Plan section)  Acute Rehab PT Goals Patient Stated Goal: return home, draw/art PT Goal Formulation: With patient/family Time For Goal Achievement: 06/01/20 Potential to Achieve Goals: Good    Frequency Min 3X/week   Barriers to discharge        Co-evaluation               AM-PAC PT "6 Clicks" Mobility  Outcome Measure Help needed turning from your back to your side while in a flat bed without using bedrails?: A Little Help needed moving from lying on your back to sitting on the side of a flat bed without using bedrails?: A Lot Help needed moving to and from a bed to a chair (including a wheelchair)?: A Lot Help needed standing up from a chair using your arms (e.g., wheelchair or bedside chair)?: A Lot Help needed to walk in hospital room?: Total Help needed climbing 3-5 steps with a railing? : Total 6 Click Score: 11    End of Session   Activity Tolerance: Patient tolerated treatment well Patient left: in bed;with call bell/phone within reach;with bed alarm set;with family/visitor present Nurse Communication: Mobility status;Precautions PT Visit Diagnosis: Other  abnormalities of gait and mobility (R26.89);Muscle weakness (generalized) (M62.81);Difficulty in walking, not elsewhere classified (R26.2)    Time: 9629-5284 PT Time Calculation (min) (ACUTE ONLY): 33 min   Charges:   PT Evaluation $PT Eval High Complexity: 1 High          Elecia Serafin P, PT Acute Rehabilitation Services Pager: [phone] Office: [phone]   [name] 05/18/2020, 12:56 PM

## 2020-05-18 NOTE — Progress Notes (Signed)
Occupational Therapy Evaluation  PTA pt lives at home with husband who has been assisting with mobility and ADL since her recent ray amputation R foot (per husband pt NWB). Before recent foot surgery, pt independent with ADL and mobility, has a 31 yo child and works as an Tree surgeon. Excellent participation with VSS on PRVC; 40% FiO2; Peep 5. Mod A with bed mobility; max A with LB ADL tasks. Pt hopes to DC home with HHOT. Will follow acutely. Recommend pt be placed in chair position at least 3x/day Encourage work with theraband. Pt communicating using her Ipad.    05/18/20 1400  OT Visit Information  Last OT Received On 05/18/20  Assistance Needed +2  PT/OT/SLP Co-Evaluation/Treatment Yes  Reason for Co-Treatment Complexity of the patient's impairments (multi-system involvement);For patient/therapist safety;To address functional/ADL transfers  OT goals addressed during session ADL's and self-care;Strengthening/ROM  History of Present Illness 31 yo female admitted 2/12 after found unresponsive at home by spouse. Pt intubated 2/13-2/15, reintubated 2/16. PMHx: Pt with recent admit to [facility] 1/28-2/9 for I&D and VAc of right foot. [facility] admit 1/10-1/17 with osteomyelitis and Rt ray amputation by Dr.Duda 1/14. DM, Covid 3x, HTN, CKD, anemia, anxiety  Precautions  Precautions Other (comment)  Precaution Comments sacral wound, ETT, vent, bil prevalon; s/p R ray amputation - is NWB per husband - need to further assess  Restrictions  Weight Bearing Restrictions Yes  RLE Weight Bearing NWB  Home Living  Family/patient expects to be discharged to: Private residence  Living Arrangements Spouse/significant other;Children  Available Help at Discharge Friend(s);Available PRN/intermittently  Type of Home House  Home Access Stairs to enter  Entrance Stairs-Number of Steps 4  Entrance Stairs-Rails Can reach both  Home Layout One level  Bathroom Shower/Tub Tub/shower unit;Walk-in Engineer, manufacturing Yes  Home Equipment Walker - 2 wheels;Wheelchair - Manufacturing systems engineer - built in  Prior Function  Level of Independence Needs assistance  Gait / Transfers Assistance Needed hopping limited transfers, WC in house  ADL's / Homemaking Assistance Needed sponge bathing, dressing on her own, have outdoor seat in shower  Communication / Swallowing Assistance Needed intubated  Comments Pt is Ind with ADLs, household chores, drives, works from home as Tree surgeon. Passenger transport manager is 5YO and in-laws are close by to babysit when needed.  Communication  Communication Other (comment) (Intubated)  Pain Assessment  Pain Assessment Faces  Faces Pain Scale 2  Pain Location sacrum, chest  Pain Descriptors / Indicators Discomfort  Pain Intervention(s) Limited activity within patient's tolerance  Cognition  Arousal/Alertness Awake/alert  Behavior During Therapy WFL for tasks assessed/performed  Overall Cognitive Status Impaired/Different from baseline  Area of Impairment Safety/judgement  Safety/Judgement Decreased awareness of deficits  General Comments will further assess; Good participation; writing on Ipad to communicate  Upper Extremity Assessment  Upper Extremity Assessment Generalized weakness  Lower Extremity Assessment  Lower Extremity Assessment Defer to PT evaluation  Cervical / Trunk Assessment  Cervical / Trunk Assessment Other exceptions  Cervical / Trunk Exceptions rounded shoulders, tendency for neck flexion and left rotation (toward vent)  ADL  Overall ADL's  Needs assistance/impaired  Eating/Feeding NPO  Grooming Minimal assistance  Grooming Details (indicate cue type and reason) able to self suction with yonker  Upper Body Bathing Minimal assistance;Bed level  Lower Body Bathing Bed level;Maximal assistance  Upper Body Dressing  Moderate assistance;Bed level  Lower Body Dressing Total assistance  Functional mobility during ADLs  (+2 mod A with bed  mobility)  Bed Mobility  Overal bed mobility Needs Assistance  Bed Mobility Supine to Sit;Sit to Supine;Rolling  Rolling Min assist  Supine to sit +2 for safety/equipment;Mod assist  Sit to supine Min assist;+2 for safety/equipment  Transfers  General transfer comment not attmepted this date  Balance  Overall balance assessment Needs assistance  Sitting balance-Leahy Scale Fair  Sitting balance - Comments without and with UE support EOB 7 min  General Comments  General comments (skin integrity, edema, etc.) Husband present and very supportive  Exercises  Exercises Other exercises  Other Exercises  Other Exercises encouraged theraband ex ove the weekend adn chari position  OT - End of Session  Equipment Utilized During Treatment Other (comment) (intubated; 40% FiO2; Peep 5; pressure support)  Activity Tolerance Patient tolerated treatment well  Patient left in bed;with call bell/phone within reach;with family/visitor present  Nurse Communication Mobility status  OT Assessment  OT Recommendation/Assessment Patient needs continued OT Services  OT Visit Diagnosis Unsteadiness on feet (R26.81);Other abnormalities of gait and mobility (R26.89);Muscle weakness (generalized) (M62.81);Other symptoms and signs involving cognitive function;Pain  Pain - Right/Left Right  Pain - part of body  (foot; sacraum)  OT Problem List Decreased strength;Decreased activity tolerance;Impaired balance (sitting and/or standing);Decreased safety awareness;Decreased knowledge of use of DME or AE;Decreased cognition;Increased edema;Pain;Cardiopulmonary status limiting activity  OT Plan  OT Frequency (ACUTE ONLY) Min 2X/week  OT Treatment/Interventions (ACUTE ONLY) Self-care/ADL training;Therapeutic exercise;Neuromuscular education;Energy conservation;DME and/or AE instruction;Therapeutic activities;Cognitive remediation/compensation;Patient/family education;Balance training  AM-PAC OT "6 Clicks" Daily Activity  Outcome Measure (Version 2)  Help from another person eating meals? 1  Help from another person taking care of personal grooming? 3  Help from another person toileting, which includes using toliet, bedpan, or urinal? 1  Help from another person bathing (including washing, rinsing, drying)? 2  Help from another person to put on and taking off regular upper body clothing? 2  Help from another person to put on and taking off regular lower body clothing? 1  6 Click Score 10  OT Recommendation  Follow Up Recommendations Home health OT;Supervision/Assistance - 24 hour  OT Equipment 3 in 1 bedside commode  Individuals Consulted  Consulted and Agree with Results and Recommendations Patient;Family member/caregiver  Family Member Consulted husband  Acute Rehab OT Goals  Patient Stated Goal return home, draw/art  OT Goal Formulation With patient/family  Time For Goal Achievement 06/01/20  Potential to Achieve Goals Good  OT Time Calculation  OT Start Time (ACUTE ONLY) 1126  OT Stop Time (ACUTE ONLY) 1155  OT Time Calculation (min) 29 min  OT General Charges  $OT Visit 1 Visit  OT Evaluation  $OT Eval High Complexity 1 High  Jadarrius Maselli, OT/L   Acute OT Clinical Specialist Acute Rehabilitation Services Pager [phone] Office [phone]

## 2020-05-18 NOTE — Progress Notes (Addendum)
NAME:  [name], MRN:  [id number], DOB:  02/20/1991, LOS: 6 ADMISSION DATE:  05/12/2020, CONSULTATION DATE:  05/13/20 REFERRING MD:  Triad, CHIEF COMPLAINT:  Hypoxia   Brief History   Mikel Pyon is a 30 yo female with a pPMH of T1DM, COVID 3x, HTN, CKD (Stage 3), and osteomyelitis s/p Rt ray amputation by Dr. [name] (04/13/20). Subsequently went to [facility] with pain in right foot where she underwent I&D and VAC placement and transferred to [facility]. Clinical course complicated by increased O2 demand on BIPAP resulting in intubation. CXR showed  extensive bilateral airspace disease 2/2 to aspiration vs infection. She has since failed extubation.  Past Medical History   . Allergy   . Anemia   . Anxiety   . Blood transfusion without reported diagnosis    Dec 2018  . Cataract    right eye  . COVID-19 03/2020  . Depression   . Diabetes type 1, uncontrolled ([place]) 11/14/2011   Since age 48  . Fibromyalgia   . Gastroparesis   . GERD (gastroesophageal reflux disease)   . Hypertension   . Infection    UTI April 2016     Significant Hospital Events   05/13/2020 transferred to intensive care unit 05/13/2020 intubated 05/13/2020 Bronchoscopy 05/25/20 extubated 05/16/20 re-intubated due to hypoxia and bradycardia  Consults:  PCCM, ID, Ortho  Procedures:  02/13 Intubated - 02/15 02/16 Re-intubated>>  Significant Diagnostic Tests:  02/13 CXR: The enteric tube projects over the gastric body.  02/14 CXR: 1. Unremarkable hardware. 2. Severe airspace disease with mildly improved lung volumes.   2/13 CXR: 1. Slight interval worsening of the bilateral hazy airspace opacities. 2. Support lines and tubes as above.  1. Significantly worsened diffuse bilateral airspace disease may reflect pneumonia, edema, or ARDS. 2. Unchanged borderline cardiomegaly.   02/13 ECHOCARDIOGRAM COMPLETE 1. Left ventricular ejection fraction, by estimation, is 60 to 65%. The left  ventricle has normal function. The left ventricle has no regional wall motion abnormalities. There is mild concentric left ventricular hypertrophy. Left ventricular diastolic parameters are indeterminate.   2. Right ventricular systolic function is normal. The right ventricular size is normal. There is moderately elevated pulmonary artery systolic pressure.   3. The mitral valve is normal in structure. Trivial mitral valve regurgitation.  4. Tricuspid valve regurgitation is mild to moderate.   5. The aortic valve is normal in structure. Aortic valve regurgitation is not visualized. No aortic stenosis is present.   6. The inferior vena cava is normal in size with greater than 50% respiratory variability, suggesting right atrial pressure of 3 mmHg.   02/14 CXR: 1. Unremarkable hardware. 2. Severe airspace disease with mildly improved lung volumes.  02/16 CXR: 1. Tube and catheter positions as described without evident pneumothorax.  2. Multifocal airspace opacity with less opacity in the right apex compared to previous study and multifocal airspace opacity elsewhere essentially stable. No new opacity evident. Appearance consistent with multifocal pneumonia. Correlation with COVID-19 status may be advisable.  3. Note that noncardiogenic edema and ARDS are differential considerations. More than one of these entities may present concurrently. Stable cardiac silhouette.  2/16 Abd XR: OG tube tip in the stomach.   Micro Data:  05/12/2020 blood cultures x2 05/12/2020 Covid negative 2/13 Broch Alveolar Lavage - moderate WBC mostly PMN, no organisms  2/13 S pneumo - negative 2/13 BCID - negative  2/13 CDiff - negative  2/14 BCx - NG 2d 2/16 Tracheal aspiration - pending  Previous surgical cultures positive for MRSA.  Antimicrobials:  05/12/2020 daptomycin - 05/14/2020 05/12/2020 Unasyn - 05/14/20 05/14/20 Zosyn - 2/15 02/17 Unasyn - 2/16 05/13/20 Linezolid >> 02/18 Ceftriaxone>>    Interim history/subjective:    No events overnight.    On evaluation today, resting in bed with ET tube in place and sleeping comfortably.     Objective   Blood pressure 113/67, pulse 73, temperature 98.8 F (37.1 C), temperature source Oral, resp. rate 18, height 5' 7"  (1.702 m), weight 83.6 kg, SpO2 99 %.    Vent Mode: PRVC FiO2 (%):  [40 %-80 %] 40 % Set Rate:  [20 bmp] 20 bmp Vt Set:  [450 mL] 450 mL PEEP:  [5 cmH20-10 cmH20] 5 cmH20 Plateau Pressure:  [22 cmH20-23 cmH20] 22 cmH20   Intake/Output Summary (Last 24 hours) at 05/18/2020 0601 Last data filed at 05/18/2020 0100 Gross per 24 hour  Intake 2668.4 ml  Output 952 ml  Net 1716.4 ml   Filed Weights   05/16/20 0100 05/17/20 0334 05/18/20 0035  Weight: 80.3 kg 83.5 kg 83.6 kg    Examination: General: alert and oriented, but sedated with ET tube in place HENT: pupils are round and reactive, EOM intact Lungs: rales and rhonchi bilaterally Cardiovascular: RRR no murmurs rubs or gallops Abdomen: soft BS+ Extremities: Right chronic shin wound and recent ray amputation with significant edema. 2+ pitting edema in all extremities. Had some purulent drainage during wound change overnight. Neuro: neurovascularly intact   Resolved Hospital Problem list     Assessment & Plan:   Acute hypoxic respiratory failure, HAP vs aspiration  Bilateral airspace opacities, suspect ARDS. Hx of COVID-10 pneumonia, 04/05/2020, 09/24/2019, 05/21/2019 She was extubated yesterday and subsequently developed hypoxia, bradycardia, and altered mental status requiring re-intubation. Her sedation was increased yesterday which allowed her O2 requirements to decrease.  - Continue Linezolid and ceftriaxone for pneumonia (Day 7 AB). Will DC antibiotics for pneumonia coverage today. - VAP protocol - GI protection with PPI - SBT/SAT when possible. Will wean sedation today and trial of SBT - PAD - versed and fentanyl  - Trach aspiration pending, but  has not been collected.  Type I diabetic: Continue current DM management will adjust as needed to maintain goal CBG. - Cont SSI as needed. 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 [name]) Subsequent I&D at [facility] with wound [facility] application Orthopedic surgery consulted for further evaluation and management. On broad spectrum AB. Patient will switch back to Daptomycin and Ceftriaxone for an end date of 3/16.  - Continue antibiotics.  - Continue pain management with fentanyl   Acute on CKD (Stage 3), pre-renal Proteinuria: Oliguric:   Patient received IV lasix overnight with ~ 1 L out. She had improvement of her Cr 1.89>2.59>2.37>2.21. Morning labs pending. Will follow closely. - Avoid nephrotoxic medication when possible.  - No emergent needs for RRT - Will continue to monitor daily.   Hypervolemia: Patient appears hypervolemic on exam. 1 L out overnight but net positive still. BNP elevated, Echo WNL. - continue lasix 80 mg q 8 - Will try to limit input.   Protein caloric malnutrition: Generalized weakness: Tube feeds restarted yesterday.   Best practice (evaluated daily)  Diet: TF Pain/Anxiety/Delirium protocol (if indicated): indicated VAP protocol (if indicated): indicated DVT prophylaxis: heparin gtt GI prophylaxis: ppi Glucose control: ssi Mobility: PT/OT Disposition: icu  Goals of Care:  Last date of multidisciplinary goals of care  discussion: n/a Family and staff present: patient's partner was at bed side. Summary of discussion: discuss current treatment plan Follow up goals of care discussion due: end of the week  Code Status: full   Labs   CBC: Recent Labs  Lab 05/12/20 0041 05/12/20 0935 05/12/20 1904 05/13/20 0530 05/13/20 2010 05/14/20 0530 05/15/20 0306 05/16/20 1111 05/17/20 0915 05/17/20 1233  WBC 8.7 6.2  --  7.0  --  2.9* 7.0  --  6.6  --   NEUTROABS 7.2  --   --  6.0  --  2.1  --    --   --   --   HGB 3.7* 9.9*   < > 9.6*   < > 8.9* 8.6* 8.5* 7.6* 7.5*  HCT 12.3* 31.1*   < > 28.4*   < > 26.6* 27.6* 25.0* 24.5* 22.0*  MCV 93.9 91.5  --  88.2  --  89.6 91.1  --  92.1  --   PLT 147* 154  --  151  --  135* 185  --  196  --    < > = values in this interval not displayed.    Basic Metabolic Panel: Recent Labs  Lab 05/13/20 0530 05/13/20 2010 05/14/20 0530 05/14/20 1819 05/15/20 0306 05/16/20 0624 05/16/20 1111 05/16/20 1131 05/16/20 1509 05/17/20 0500 05/17/20 1233 05/17/20 1753  NA 137   < > 136  --  137 137 137  --   --  135 137  --   K 3.8   < > 3.6  --  4.0 3.6 3.7  --   --  3.3* 4.3  --   CL 101  --  100  --  102 100  --   --   --  99  --   --   CO2 24  --  24  --  22 23  --   --   --  23  --   --   GLUCOSE 158*  --  139*  --  204* 61*  --   --   --  200*  --   --   BUN 27*  --  28*  --  35* 39*  --   --   --  41*  --   --   CREATININE 1.90*  --  1.89*  --  2.59* 2.37*  --   --   --  2.21*  --   --   CALCIUM 8.0*  --  7.7*  --  7.8* 7.8*  --   --   --  7.6*  --   --   MG 1.5*  --  1.8   < > 1.9 1.8  --  1.8 1.7 1.8  --  2.1  PHOS  --   --  4.7*   < > 5.3* 4.8*  --  5.0* 4.3 4.6  --  4.1   < > = values in this interval not displayed.   GFR: Estimated Creatinine Clearance: 41.4 mL/min (A) (by C-G formula based on SCr of 2.21 mg/dL (H)). Recent Labs  Lab 05/13/20 0530 05/14/20 0530 05/15/20 0306 05/17/20 0915  PROCALCITON 10.74 14.62  --   --   WBC 7.0 2.9* 7.0 6.6    Liver Function Tests: Recent Labs  Lab 05/12/20 0041 05/13/20 0530 05/14/20 0530 05/16/20 0624  AST 100* 162* 78* 37  ALT 85* 103* 77* 44  ALKPHOS 120 192* 134* 129*  BILITOT 0.5 2.1* 1.8* 1.7*  PROT  5.8* 5.9* 5.5* 6.0*  ALBUMIN 1.6* 1.4* 1.2* 1.1*   No results for input(s): LIPASE, AMYLASE in the last 168 hours. No results for input(s): AMMONIA in the last 168 hours.  ABG    Component Value Date/Time   PHART 7.374 05/17/2020 1233   PCO2ART 46.3 05/17/2020 1233    PO2ART 92 05/17/2020 1233   HCO3 26.9 05/17/2020 1233   TCO2 28 05/17/2020 1233   ACIDBASEDEF 1.9 05/13/2020 0825   O2SAT 97.0 05/17/2020 1233     Coagulation Profile: No results for input(s): INR, PROTIME in the last 168 hours.  Cardiac Enzymes: No results for input(s): CKTOTAL, CKMB, CKMBINDEX, TROPONINI in the last 168 hours.  HbA1C: Hemoglobin A1C  Date/Time Value Ref Range Status  02/29/2020 03:58 PM 9.0 (A) 4.0 - 5.6 % Final   Hgb A1c MFr Bld  Date/Time Value Ref Range Status  04/10/2020 11:22 AM 8.6 (H) 4.8 - 5.6 % Final    Comment:    (NOTE) Pre diabetes:          5.7%-6.4%  Diabetes:              >6.4%  Glycemic control for   <7.0% adults with diabetes   09/25/2019 04:38 AM 8.3 (H) 4.8 - 5.6 % Final    Comment:    (NOTE) Pre diabetes:          5.7%-6.4%  Diabetes:              >6.4%  Glycemic control for   <7.0% adults with diabetes     CBG: Recent Labs  Lab 05/17/20 1201 05/17/20 1508 05/17/20 1903 05/17/20 2316 05/18/20 0301  GLUCAP 183* 167* 135* 206* 154*    Critical care time:      Lawerance Cruel, D.O.  Internal Medicine Resident, PGY-2 [facility]  Pager: [phone] 6:01 AM, 05/18/2020

## 2020-05-18 NOTE — Progress Notes (Signed)
[facility] Infectious Diseases Follow Up Note  Patient Identification: Patient Name: [name] MRN: [id number] [place] Date: 05/12/2020 12:22 AM Age: 30 y.o.Today's Date: 05/18/2020   Reason for Visit: PNA and Rt foot dehiscence   Principal Problem:   Chronic anemia Active Problems:   Hypertension associated with diabetes ([place])   Diabetes mellitus type 1 ([place])   Severe protein-calorie malnutrition ([place])   Osteomyelitis (HCC)   GI bleeding   AKI (acute kidney injury) ([place])   Diarrhea   Acute upper GI bleed   Respiratory failure (HCC)   Malnutrition of moderate degree   ARDS (adult respiratory distress syndrome) ([place])  Antibiotics:Unasyn 2/12-2/17                     Ceftriaxone 2/17- linezolid 2/13- c Daptomycin prior to admit - 2/12 Ciprofloxacin prior to admit - 2/12   Lines/Tubes:picc+  Interval Events: remains afebrile, no leukocytosis, planned for  BT  Assessment # Acute respiratory failure 2/2 aspiration PNA with bilateral pulmonary infiltrates - reintubated ) 2/16) - SBT trial planned   #Wound dehiscence at the site of 1st ray amputation -DFU-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  -Will plan to switch to Dapto and ceftriaxone for his Rt foot wound dehiscence once he is -stable from respiratory standpoint -Monitor CBC, CMP -CRP and procalcitonin ( ordered)  -Vent management per CCM   Will  follow peripherally over the weekend.   Rest of the management as per the primary team. Thank you for the consult. Please page with pertinent questions or concerns.  ______________________________________________________________________ Subjective patient seen and examined at the bedside. Intubated. Able to follow verbal commands. Some drainage noted at the rt foot wound per RN   Vitals BP 108/62   Pulse 72   Temp 98.4 F (36.9 C) (Oral)   Resp 19   Ht _0  (1.702 m)   Wt 83.6 kg   SpO2 97%   BMI 28.87 kg/m     Physical Exam Intubated, awake and alert, able to follow commands, writing in the white board for conversation  HEENT WNL Chest - coarse breath sounds CVS- Normal s1s2, RRR Abdomen - soft  Extremities - minimal pedal edema, Rt 1st ray amputated site looks dry and necrotic Skin - no obvious rashes    Pertinent Microbiology Results for orders placed or performed during the hospital encounter of 05/12/20  SARS CORONAVIRUS 2 (TAT 6-24 HRS) Nasopharyngeal Nasopharyngeal Swab     Status: None   Collection Time: 05/12/20  8:21 AM   Specimen: Nasopharyngeal Swab  Result Value Ref Range Status   SARS Coronavirus 2 NEGATIVE NEGATIVE Final    Comment: (NOTE) SARS-CoV-2 target nucleic acids are NOT DETECTED.  The SARS-CoV-2 RNA is generally detectable in upper and lower respiratory specimens during the acute phase of infection. Negative results do not preclude SARS-CoV-2 infection, do not rule out co-infections with other pathogens, and should not be used as the sole basis for treatment or other patient management decisions. Negative  results must be combined with clinical observations, patient history, and epidemiological information. The expected result is Negative.  Fact Sheet for Patients: SugarRoll.be  Fact Sheet for Healthcare Providers: https://www.woods-mathews.com/  This test is not yet approved or cleared by the  [place] FDA and  has been authorized for detection and/or diagnosis of SARS-CoV-2 by FDA under an Emergency Use Authorization (EUA). This EUA will remain  in effect (meaning this test can be used) for the duration of the COVID-19 declaration under Se ction 564(b)(1) of the Act, 21 U.S.C. section 360bbb-3(b)(1), unless the authorization is terminated or revoked sooner.  Performed at [facility], [place] [address] [place], [place] [address]   C Difficile Quick Screen w PCR reflex     Status: None   Collection Time: 05/13/20  6:27 AM   Specimen: STOOL  Result Value Ref Range Status   C Diff antigen NEGATIVE NEGATIVE Final   C Diff toxin NEGATIVE NEGATIVE Final   C Diff interpretation No C. difficile detected.  Final    Comment: Performed at [facility], [place] [address] [place], [place] [address]  Respiratory (~20 pathogens) panel by PCR     Status: None   Collection Time: 05/13/20  3:45 PM   Specimen: Nasopharyngeal Swab; Respiratory  Result Value Ref Range Status   Adenovirus NOT DETECTED NOT DETECTED Final   Coronavirus 229E NOT DETECTED NOT DETECTED Final    Comment: (NOTE) The Coronavirus on the Respiratory Panel, DOES NOT test for the novel  Coronavirus (2019 nCoV)    Coronavirus HKU1 NOT DETECTED NOT DETECTED Final   Coronavirus NL63 NOT DETECTED NOT DETECTED Final   Coronavirus OC43 NOT DETECTED NOT DETECTED Final   Metapneumovirus NOT DETECTED NOT DETECTED Final   Rhinovirus / Enterovirus NOT DETECTED NOT DETECTED Final   Influenza A NOT DETECTED NOT DETECTED Final   Influenza B NOT DETECTED NOT DETECTED Final   Parainfluenza Virus 1 NOT DETECTED NOT DETECTED Final   Parainfluenza Virus 2 NOT DETECTED NOT DETECTED Final   Parainfluenza Virus 3 NOT DETECTED NOT DETECTED Final   Parainfluenza Virus 4 NOT DETECTED NOT DETECTED Final   Respiratory Syncytial Virus NOT DETECTED NOT DETECTED Final   Bordetella pertussis NOT DETECTED NOT DETECTED Final    Bordetella Parapertussis NOT DETECTED NOT DETECTED Final   Chlamydophila pneumoniae NOT DETECTED NOT DETECTED Final   Mycoplasma pneumoniae NOT DETECTED NOT DETECTED Final    Comment: Performed at [facility], [place]. [address], [place], [place] [address]  Culture, respiratory     Status: None   Collection Time: 05/13/20  6:04 PM   Specimen: Bronchoalveolar Lavage; Respiratory  Result Value Ref Range Status   Specimen Description BRONCHIAL ALVEOLAR LAVAGE  Final   Special Requests NONE  Final   Gram Stain   Final    MODERATE WBC PRESENT, PREDOMINANTLY PMN NO ORGANISMS SEEN    Culture   Final    NO GROWTH Performed at [facility], [address] [place], [place] [address]    Report Status 05/16/2020 FINAL  Final  Acid Fast Smear (AFB)     Status: None   Collection Time: 05/13/20  6:04 PM   Specimen: Bronchial Alveolar Lavage  Result Value Ref Range Status   AFB Specimen Processing Concentration  Final   Acid Fast Smear Negative  Final    Comment: (NOTE) Performed At: [facility] [address] [place], [place] [id number] Rush Farmer MD [id number]  Source (AFB) BRONCHIAL ALVEOLAR LAVAGE  Final    Comment: Performed at [facility], [place] [address] [place], [place] [address]  Fungus Culture With Stain     Status: None (Preliminary result)   Collection Time: 05/13/20  6:04 PM   Specimen: Bronchial Alveolar Lavage  Result Value Ref Range Status   Fungus Stain Final report  Final    Comment: (NOTE) Performed At: [facility] [place], [place] [id number] Rush Farmer MD XN:[id number]    Fungus (Mycology) Culture PENDING  Incomplete   Fungal Source BRONCHIAL ALVEOLAR LAVAGE  Final    Comment: Performed at [facility], [place] [address], [place], [place] [address]  Fungus Culture Result     Status: None   Collection Time: 05/13/20  6:04 PM  Result Value Ref Range Status   Result 1 Comment  Final    Comment:  (NOTE) KOH/Calcofluor preparation:  no fungus observed. Performed At: [facility] [place], [place] [id number] Rush Farmer MD DJ:[id number]   Culture, blood (routine x 2)     Status: None (Preliminary result)   Collection Time: 05/14/20  4:06 PM   Specimen: BLOOD  Result Value Ref Range Status   Specimen Description BLOOD LEFT ANTECUBITAL  Final   Special Requests   Final    BOTTLES DRAWN AEROBIC AND ANAEROBIC Blood Culture adequate volume   Culture   Final    NO GROWTH 3 DAYS Performed at [facility], [address], [place], [place] [address]    Report Status PENDING  Incomplete  Culture, blood (routine x 2)     Status: None (Preliminary result)   Collection Time: 05/14/20  4:12 PM   Specimen: BLOOD LEFT FOREARM  Result Value Ref Range Status   Specimen Description BLOOD LEFT FOREARM  Final   Special Requests   Final    BOTTLES DRAWN AEROBIC ONLY Blood Culture results may not be optimal due to an inadequate volume of blood received in culture bottles   Culture   Final    NO GROWTH 3 DAYS Performed at [facility], [place] [address] [place], [place] [address]    Report Status PENDING  Incomplete    Pertinent Lab. CBC Latest Ref Rng & Units 05/18/2020 05/17/2020 05/17/2020  WBC 4.0 - 10.5 K/uL 7.0 - 6.6  Hemoglobin 12.0 - 15.0 g/dL 7.3(L) 7.5(L) 7.6(L)  Hematocrit 36.0 - 46.0 % 24.1(L) 22.0(L) 24.5(L)  Platelets 150 - 400 K/uL 180 - 196   CMP Latest Ref Rng & Units 05/18/2020 05/17/2020 05/17/2020  Glucose 70 - 99 mg/dL 107(H) - 200(H)  BUN 6 - 20 mg/dL 43(H) - 41(H)  Creatinine 0.44 - 1.00 mg/dL 1.86(H) - 2.21(H)  Sodium 135 - 145 mmol/L 138 137 135  Potassium 3.5 - 5.1 mmol/L 3.5 4.3 3.3(L)  Chloride 98 - 111 mmol/L 105 - 99  CO2 22 - 32 mmol/L 25 - 23  Calcium 8.9 - 10.3 mg/dL 7.8(L) - 7.6(L)  Total Protein 6.5 - 8.1 g/dL - - -  Total Bilirubin 0.3 - 1.2 mg/dL - - -  Alkaline Phos 38 - 126 U/L - - -  AST 15 - 41 U/L - - -  ALT 0 -  44 U/L - - -     Pertinent Imaging today Plain films and CT images have been personally visualized and interpreted; radiology reports have been reviewed. Decision making incorporated into the Impression / Recommendations.  I have spent approx  30 minutes for this patient encounter including review of prior medical records with greater than 50% of time being face to face and coordination of their care.  Electronically signed by:   [name], MD Infectious Disease Physician [facility] for Infectious Disease Pager: [phone]

## 2020-05-18 NOTE — Progress Notes (Signed)
Pharmacy Electrolyte Replacement  Recent Labs:  Recent Labs    05/17/20 1753 05/18/20 0614  K  --  3.5  MG 2.1 2.1  PHOS 4.1  --   CREATININE  --  1.86*    Plan:  -K 3.5, 72mq x1 given per tube  Thank you,  AAlfonse Spruce PharmD PGY2 ID Pharmacy Resident Phone between 7 am - 3:30 pm: [phone] Please check AMION for all [place] numbers After 10:00 PM, call MRed Oaks [phone]

## 2020-05-19 ENCOUNTER — Inpatient Hospital Stay (HOSPITAL_COMMUNITY): Payer: 59

## 2020-05-19 DIAGNOSIS — J80 Acute respiratory distress syndrome: Secondary | ICD-10-CM | POA: Diagnosis not present

## 2020-05-19 LAB — GLUCOSE, CAPILLARY
Glucose-Capillary: 156 mg/dL — ABNORMAL HIGH (ref 70–99)
Glucose-Capillary: 166 mg/dL — ABNORMAL HIGH (ref 70–99)
Glucose-Capillary: 183 mg/dL — ABNORMAL HIGH (ref 70–99)
Glucose-Capillary: 217 mg/dL — ABNORMAL HIGH (ref 70–99)
Glucose-Capillary: 236 mg/dL — ABNORMAL HIGH (ref 70–99)
Glucose-Capillary: 276 mg/dL — ABNORMAL HIGH (ref 70–99)
Glucose-Capillary: 60 mg/dL — ABNORMAL LOW (ref 70–99)
Glucose-Capillary: 75 mg/dL (ref 70–99)

## 2020-05-19 LAB — BASIC METABOLIC PANEL
Anion gap: 9 (ref 5–15)
BUN: 43 mg/dL — ABNORMAL HIGH (ref 6–20)
CO2: 27 mmol/L (ref 22–32)
Calcium: 8 mg/dL — ABNORMAL LOW (ref 8.9–10.3)
Chloride: 104 mmol/L (ref 98–111)
Creatinine, Ser: 1.48 mg/dL — ABNORMAL HIGH (ref 0.44–1.00)
GFR, Estimated: 49 mL/min — ABNORMAL LOW (ref >60)
Glucose, Bld: 162 mg/dL — ABNORMAL HIGH (ref 70–99)
Potassium: 3.9 mmol/L (ref 3.5–5.1)
Sodium: 140 mmol/L (ref 135–145)

## 2020-05-19 LAB — MAGNESIUM: Magnesium: 2 mg/dL (ref 1.7–2.4)

## 2020-05-19 LAB — CBC
HCT: 25.2 % — ABNORMAL LOW (ref 36.0–46.0)
Hemoglobin: 8.1 g/dL — ABNORMAL LOW (ref 12.0–15.0)
MCH: 29.6 pg (ref 26.0–34.0)
MCHC: 32.1 g/dL (ref 30.0–36.0)
MCV: 92 fL (ref 80.0–100.0)
Platelets: 203 10*3/uL (ref 150–400)
RBC: 2.74 MIL/uL — ABNORMAL LOW (ref 3.87–5.11)
RDW: 15.1 % (ref 11.5–15.5)
WBC: 7.4 10*3/uL (ref 4.0–10.5)
nRBC: 0 % (ref 0.0–0.2)

## 2020-05-19 LAB — PROCALCITONIN: Procalcitonin: 4.45 ng/mL

## 2020-05-19 LAB — CULTURE, BLOOD (ROUTINE X 2)
Culture: NO GROWTH
Culture: NO GROWTH
Special Requests: ADEQUATE

## 2020-05-19 LAB — CREATININE, SERUM
Creatinine, Ser: 1.75 mg/dL — ABNORMAL HIGH (ref 0.44–1.00)
GFR, Estimated: 40 mL/min — ABNORMAL LOW (ref >60)

## 2020-05-19 IMAGING — DX DG CHEST 1V PORT
1 series · 1 of 1 positions shown · non-contrast
Comparison: [DATE], [DATE], [DATE]

CLINICAL DATA: 29-year-old female with acute respiratory distress.

EXAM:
PORTABLE CHEST 1 VIEW

[chest]
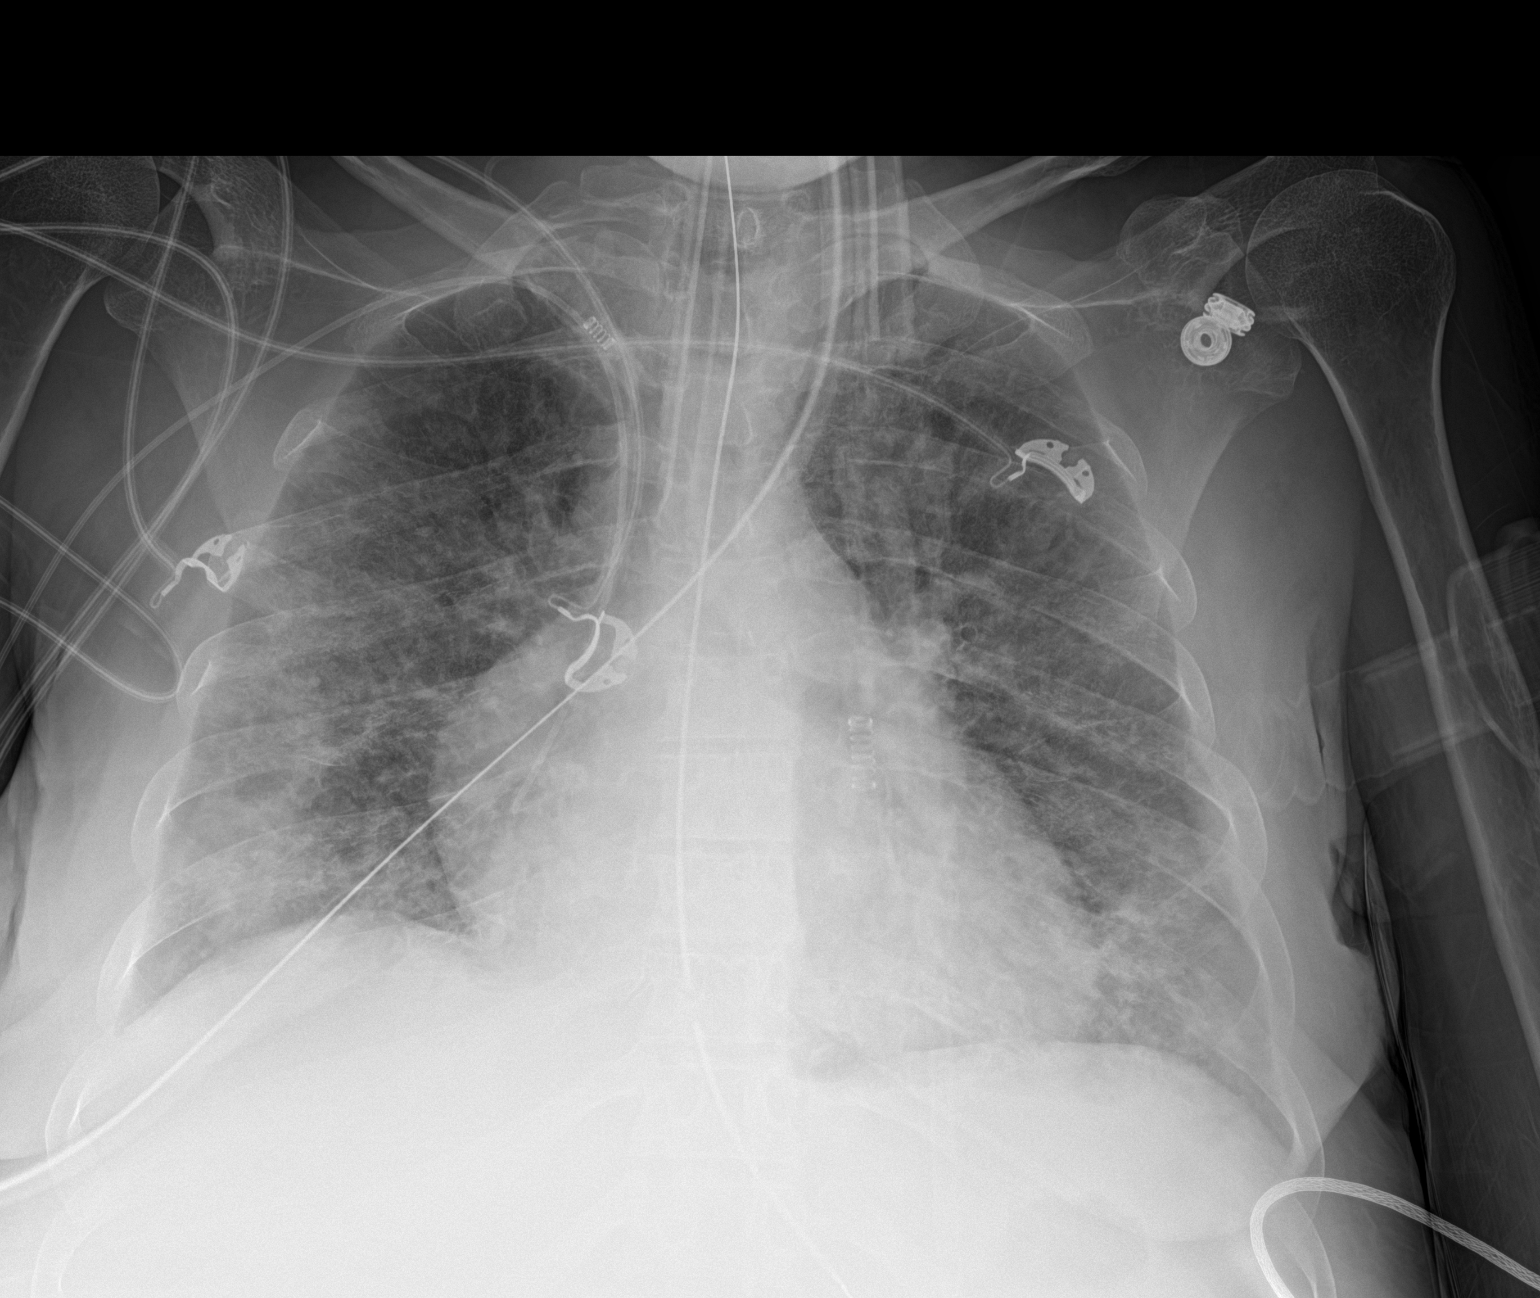

[1 of 1 positions shown; findings below may reference images not displayed]

FINDINGS: Unchanged position of endotracheal tube with the distal tip in the
mid distal thoracic trachea. Right upper extremity PICC in place
with the tip near the cavoatrial junction. The proximal side hole on
the gastric decompression tube remains just superior to the
gastroesophageal junction, the tip terminating off the inferior
aspect of this image. Unchanged cardiomediastinal silhouette.
Overall similar diffuse, peripheral lower lobe predominant hazy
airspace opacities given differences in projection and technique. No
new focal consolidation, pleural effusion, or pneumothorax. The
visualized osseous structures are within normal limits.
IMPRESSION: 1. Similar appearing diffuse airspace opacifications.
2. The gastric decompression tube proximal side hole is again just
superior to the gastroesophageal junction. Consider advancement by
approximately 5 cm for optimal function.

## 2020-05-19 MED ORDER — OXYCODONE HCL 5 MG/5ML PO SOLN
5.0000 mg | ORAL | Status: DC
  Administered 2020-05-19 – 2020-05-20 (×5): 5 mg
  Filled 2020-05-19 (×5): qty 5

## 2020-05-19 MED ORDER — ORAL CARE MOUTH RINSE
15.0000 mL | Freq: Two times a day (BID) | OROMUCOSAL | Status: DC
  Administered 2020-05-19 – 2020-05-24 (×10): 15 mL via OROMUCOSAL

## 2020-05-19 MED ORDER — ALTEPLASE 2 MG IJ SOLR
2.0000 mg | Freq: Once | INTRAMUSCULAR | Status: DC
  Filled 2020-05-19: qty 2

## 2020-05-19 MED ORDER — IPRATROPIUM-ALBUTEROL 0.5-2.5 (3) MG/3ML IN SOLN
3.0000 mL | Freq: Two times a day (BID) | RESPIRATORY_TRACT | Status: DC
  Administered 2020-05-19 – 2020-05-24 (×9): 3 mL via RESPIRATORY_TRACT
  Filled 2020-05-19 (×10): qty 3

## 2020-05-19 MED ORDER — MORPHINE SULFATE (PF) 2 MG/ML IV SOLN: 2.0000 mg | INTRAVENOUS | Status: DC | PRN

## 2020-05-19 MED ORDER — FUROSEMIDE 10 MG/ML IJ SOLN
80.0000 mg | Freq: Two times a day (BID) | INTRAMUSCULAR | Status: DC
  Administered 2020-05-19 – 2020-05-21 (×4): 80 mg via INTRAVENOUS
  Filled 2020-05-19 (×4): qty 8

## 2020-05-19 MED ORDER — SODIUM CHLORIDE 0.9 % IV SOLN
500.0000 mg | Freq: Every day | INTRAVENOUS | Status: DC
  Administered 2020-05-19 – 2020-05-24 (×6): 500 mg via INTRAVENOUS
  Filled 2020-05-19 (×9): qty 10

## 2020-05-19 NOTE — Progress Notes (Signed)
Pharmacy Antibiotic Note  [name] is a 30 y.o. female admitted on 05/12/2020 with altered mental status and subsequent respiratory distress concerning for pneumonia who was changed from Daptomycin and Ceftriaxone regimen for recurrent osteomyelitis to Linezolid and Ceftriaxone to treat pneumonia.  Pharmacy has been consulted to change back to Daptomycin dosing. Plan of care is to treat with Daptomycin and Ceftriaxone through original date of 3/16.   SCr has been improving at 1.75 this AM with CrCl ~ 51 mL/min. PCT is trending down. CRP is trending down as well. Patient is afebrile and WBC is within normal limits.   Plan: Restart Daptomycin 500 mg IV daily.  CK weekly while on therapy.  Monitor renal function and clinical status.   Height: _0  (170.2 cm) Weight: 80.9 kg (178 lb 5.6 oz) IBW/kg (Calculated) : 61.6  Temp (24hrs), Avg:98.2 F (36.8 C), Min:98 F (36.7 C), Max:98.5 F (36.9 C)  Recent Labs  Lab 05/14/20 0530 05/15/20 0306 05/16/20 0624 05/17/20 0500 05/17/20 0915 05/18/20 0614 05/18/20 0630 05/19/20 0240 05/19/20 0405  WBC 2.9* 7.0  --   --  6.6  --  7.0  --  7.4  CREATININE 1.89* 2.59* 2.37* 2.21*  --  1.86*  --  1.75*  --     Estimated Creatinine Clearance: 51.4 mL/min (A) (by C-G formula based on SCr of 1.75 mg/dL (H)).    Allergies  Allergen Reactions  . Other Anaphylaxis    Reaction to [place] nuts   . Lactose Intolerance (Gi) Diarrhea    Antimicrobials this admission: Daptomycin (on PTA)>>2/13; 2/19 >> Cipro (on PTA)>> 2/12 Unasyn 2/12>>2/14, 2/16>>2/17 Zosyn 2/14>>2/16 Linezolid 2/13>>2/18 Ceftriaxone 2/18 >>  Dose adjustments this admission:   Microbiology results: COVID PCR 2/12: negative C diff 2/12: negative Strep pneumo uAg neg 2/13 resp PCR neg 2/13 Resp cx/BAL NGF 2/14 bcx ngtd 2/16 resp cx sent  Thank you for allowing pharmacy to be a part of this patient's care.  Sloan Leiter, PharmD, BCPS, BCCCP Clinical  Pharmacist Please refer to [facility] for [place] numbers 05/19/2020 7:36 AM

## 2020-05-19 NOTE — Progress Notes (Signed)
NAME:  [name], MRN:  [id number], DOB:  06/30/1990, LOS: 7 ADMISSION DATE:  05/12/2020, CONSULTATION DATE:  05/13/20 REFERRING MD:  [place], CHIEF COMPLAINT:  Hypoxia   Brief History   [name] is a 30 yo female with a pPMH of T1DM, COVID 3x, HTN, CKD (Stage 3), and osteomyelitis s/p Rt ray amputation by Dr. [name] (04/13/20). Subsequently went to [facility] with pain in right foot where she underwent I&D and VAC placement and transferred to [facility]. Clinical course complicated by increased O2 demand on BIPAP resulting in intubation. CXR showed  extensive bilateral airspace disease 2/2 to aspiration vs infection. She has since failed extubation.   Significant Hospital Events   05/13/2020 transferred to intensive care unit 05/13/2020 intubated 05/13/2020 Bronchoscopy 05/25/20 extubated 05/16/20 re-intubated due to hypoxia and bradycardia  Consults:  PCCM, ID, Ortho  Procedures:  02/13 Intubated - 02/15 02/16 Re-intubated>>  Significant Diagnostic Tests:  02/13 CXR: The enteric tube projects over the gastric body.  02/14 CXR: 1. Unremarkable hardware. 2. Severe airspace disease with mildly improved lung volumes.   2/13 CXR: 1. Slight interval worsening of the bilateral hazy airspace opacities. 2. Support lines and tubes as above.  1. Significantly worsened diffuse bilateral airspace disease may reflect pneumonia, edema, or ARDS. 2. Unchanged borderline cardiomegaly.   02/13 ECHOCARDIOGRAM COMPLETE 1. Left ventricular ejection fraction, by estimation, is 60 to 65%. The left ventricle has normal function. The left ventricle has no regional wall motion abnormalities. There is mild concentric left ventricular hypertrophy. Left ventricular diastolic parameters are indeterminate.   2. Right ventricular systolic function is normal. The right ventricular size is normal. There is moderately elevated pulmonary artery systolic pressure.   3. The mitral valve is normal in structure.  Trivial mitral valve regurgitation.  4. Tricuspid valve regurgitation is mild to moderate.   5. The aortic valve is normal in structure. Aortic valve regurgitation is not visualized. No aortic stenosis is present.   6. The inferior vena cava is normal in size with greater than 50% respiratory variability, suggesting right atrial pressure of 3 mmHg.   02/14 CXR: 1. Unremarkable hardware. 2. Severe airspace disease with mildly improved lung volumes.  02/16 CXR: 1. Tube and catheter positions as described without evident pneumothorax.  2. Multifocal airspace opacity with less opacity in the right apex compared to previous study and multifocal airspace opacity elsewhere essentially stable. No new opacity evident. Appearance consistent with multifocal pneumonia. Correlation with COVID-19 status may be advisable.  3. Note that noncardiogenic edema and ARDS are differential considerations. More than one of these entities may present concurrently. Stable cardiac silhouette.  2/16 Abd XR: OG tube tip in the stomach.   Micro Data:  05/12/2020 blood cultures x2 05/12/2020 Covid negative 2/13 Broch Alveolar Lavage - moderate WBC mostly PMN, no organisms  2/13 S pneumo - negative 2/13 BCID - negative  2/13 CDiff - negative  2/14 BCx - NG 2d 2/16 Tracheal aspiration - pending Previous surgical cultures positive for MRSA.  Antimicrobials:  05/12/2020 daptomycin - 05/14/2020 05/12/2020 Unasyn - 05/14/20 05/14/20 Zosyn - 2/15 02/17 Unasyn - 2/16 05/13/20 Linezolid >> 2/18 02/18 Ceftriaxone>> Daptomycin 2/19 >>   Interim history/subjective:    Difficulty with PICC flow overnight, catheter ordered Fentanyl 200, midazolam 1 0.40, PEEP 5 I/O+ 4.6 L total  Objective   Blood pressure 129/78, pulse 82, temperature 98 F (36.7 C), resp. rate 16, height 5' 7"  (1.702 m), weight 80.9 kg, SpO2 99 %.  Vent Mode: PRVC FiO2 (%):  [40 %] 40 % Set Rate:  [20 bmp] 20 bmp Vt Set:  [450 mL] 450  mL PEEP:  [5 cmH20-8 cmH20] 5 cmH20 Pressure Support:  [10 cmH20] 10 cmH20 Plateau Pressure:  [18 cmH20-22 cmH20] 18 cmH20   Intake/Output Summary (Last 24 hours) at 05/19/2020 0716 Last data filed at 05/19/2020 0600 Gross per 24 hour  Intake 1832.97 ml  Output 3650 ml  Net -1817.03 ml   Filed Weights   05/17/20 0334 05/18/20 0035 05/19/20 0426  Weight: 83.5 kg 83.6 kg 80.9 kg    Examination: General: Intubated, awake, interacting HENT: ET tube in place, pupils equal Lungs: Few scattered bilateral crackles, tolerating PSV well Cardiovascular: Regular, distant, no murmur Abdomen: Soft, nondistended, positive bowel sounds Extremities: Right foot wound dressed Neuro: Awake, alert, interacting appropriately, follows commands, moderate cough strength   Resolved Hospital Problem list     Assessment & Plan:   Acute hypoxic respiratory failure, HAP vs aspiration  Bilateral airspace opacities, suspect ARDS. Hx of COVID-19 pneumonia, 04/05/2020, 09/24/2019, 05/21/2019 Reintubated 2/16 -Linezolid and ceftriaxone completed 2/19, plan to continue coverage for osteomyelitis as below -VAP prevention orders -Push for volume removal as tolerated -Follow respiratory culture -Work toward spontaneous breathing 2/19  Type I diabetic: Continue current DM management will adjust as needed to maintain goal CBG. 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 [name]) Subsequent I&D at [facility] with wound [facility] application Orthopedic surgery consulted for further evaluation and management. On broad spectrum AB. Patient will switch back to Daptomycin and Ceftriaxone for an end date of 3/16.  -Antibiotics transitioned to ceftriaxone, daptomycin on 2/19 -Pain management, fentanyl ordered  Acute on CKD (Stage 3), pre-renal Proteinuria: Oliguric:   Patient received IV lasix overnight with ~ 1 L out. She had improvement of her Cr  1.89>2.59>2.37>2.21. Morning labs pending. Will follow closely. -Continue to follow serum creatinine, improving and tolerating aggressive diuresis -Decrease Lasix dosing 2/19 -Renal dose medications, avoid nephrotoxins -Follow BMP, urine output  Hypervolemia: Patient appears hypervolemic on exam.  -Decreasing Lasix on 2/19 to 80 mg every 12 -Follow chest x-ray, clinical exam  Protein caloric malnutrition: Generalized weakness: -Tube feeding  Best practice (evaluated daily)  Diet: TF Pain/Anxiety/Delirium protocol (if indicated): Fentanyl Versed VAP protocol (if indicated): indicated DVT prophylaxis: heparin subcutaneous GI prophylaxis: ppi Glucose control: ssi Mobility: PT/OT Disposition: icu  Goals of Care:  Last date of multidisciplinary goals of care discussion: n/a Family and staff present: patient's partner was at bed side. Summary of discussion: discuss current treatment plan Follow up goals of care discussion due: end of the week  Code Status: full   Labs   CBC: Recent Labs  Lab 05/13/20 0530 05/13/20 2010 05/14/20 0530 05/15/20 0306 05/16/20 1111 05/17/20 0915 05/17/20 1233 05/18/20 0630 05/19/20 0405  WBC 7.0  --  2.9* 7.0  --  6.6  --  7.0 7.4  NEUTROABS 6.0  --  2.1  --   --   --   --   --   --   HGB 9.6*   < > 8.9* 8.6* 8.5* 7.6* 7.5* 7.3* 8.1*  HCT 28.4*   < > 26.6* 27.6* 25.0* 24.5* 22.0* 24.1* 25.2*  MCV 88.2  --  89.6 91.1  --  92.1  --  93.8 92.0  PLT 151  --  135* 185  --  196  --  180 203   < > =  values in this interval not displayed.    Basic Metabolic Panel: Recent Labs  Lab 05/14/20 0530 05/14/20 1819 05/15/20 0306 05/16/20 0624 05/16/20 1111 05/16/20 1131 05/16/20 1509 05/17/20 0500 05/17/20 1233 05/17/20 1753 05/18/20 0614 05/19/20 0240  NA 136  --  137 137 137  --   --  135 137  --  138  --   K 3.6  --  4.0 3.6 3.7  --   --  3.3* 4.3  --  3.5  --   CL 100  --  102 100  --   --   --  99  --   --  105  --   CO2 24  --   22 23  --   --   --  23  --   --  25  --   GLUCOSE 139*  --  204* 61*  --   --   --  200*  --   --  107*  --   BUN 28*  --  35* 39*  --   --   --  41*  --   --  43*  --   CREATININE 1.89*  --  2.59* 2.37*  --   --   --  2.21*  --   --  1.86* 1.75*  CALCIUM 7.7*  --  7.8* 7.8*  --   --   --  7.6*  --   --  7.8*  --   MG 1.8   < > 1.9 1.8  --  1.8 1.7 1.8  --  2.1 2.1  --   PHOS 4.7*   < > 5.3* 4.8*  --  5.0* 4.3 4.6  --  4.1  --   --    < > = values in this interval not displayed.   GFR: Estimated Creatinine Clearance: 51.4 mL/min (A) (by C-G formula based on SCr of 1.75 mg/dL (H)). Recent Labs  Lab 05/13/20 0530 05/14/20 0530 05/15/20 0306 05/17/20 0915 05/18/20 0630 05/18/20 1532 05/19/20 0240 05/19/20 0405  PROCALCITON 10.74 14.62  --   --   --  5.67 4.45  --   WBC 7.0 2.9* 7.0 6.6 7.0  --   --  7.4    Liver Function Tests: Recent Labs  Lab 05/13/20 0530 05/14/20 0530 05/16/20 0624  AST 162* 78* 37  ALT 103* 77* 44  ALKPHOS 192* 134* 129*  BILITOT 2.1* 1.8* 1.7*  PROT 5.9* 5.5* 6.0*  ALBUMIN 1.4* 1.2* 1.1*   No results for input(s): LIPASE, AMYLASE in the last 168 hours. No results for input(s): AMMONIA in the last 168 hours.  ABG    Component Value Date/Time   PHART 7.374 05/17/2020 1233   PCO2ART 46.3 05/17/2020 1233   PO2ART 92 05/17/2020 1233   HCO3 26.9 05/17/2020 1233   TCO2 28 05/17/2020 1233   ACIDBASEDEF 1.9 05/13/2020 0825   O2SAT 97.0 05/17/2020 1233     Coagulation Profile: No results for input(s): INR, PROTIME in the last 168 hours.  Cardiac Enzymes: No results for input(s): CKTOTAL, CKMB, CKMBINDEX, TROPONINI in the last 168 hours.  HbA1C: Hemoglobin A1C  Date/Time Value Ref Range Status  02/29/2020 03:58 PM 9.0 (A) 4.0 - 5.6 % Final   Hgb A1c MFr Bld  Date/Time Value Ref Range Status  04/10/2020 11:22 AM 8.6 (H) 4.8 - 5.6 % Final    Comment:    (NOTE) Pre diabetes:  5.7%-6.4%  Diabetes:              >6.4%  Glycemic  control for   <7.0% adults with diabetes   09/25/2019 04:38 AM 8.3 (H) 4.8 - 5.6 % Final    Comment:    (NOTE) Pre diabetes:          5.7%-6.4%  Diabetes:              >6.4%  Glycemic control for   <7.0% adults with diabetes     CBG: Recent Labs  Lab 05/18/20 1103 05/18/20 1528 05/18/20 1913 05/19/20 0009 05/19/20 0343  GLUCAP 148* 145* 149* 217* 183*    Critical care time: 33 min     Baltazar Apo, MD, PhD 05/19/2020, 7:30 AM [facility] [phone] or if no answer before 7:00PM call [phone] For any issues after 7:00PM please call eLink [phone]

## 2020-05-19 NOTE — Progress Notes (Signed)
eLink Physician-Brief Progress Note Patient Name: [name] DOB: [birth year] MRN: [id number]   Date of Service  05/19/2020  HPI/Events of Note  PICC clogged up.  eICU Interventions  Cathflo x 1 ordered.        Kerry Kass [name] 05/19/2020, 1:52 AM

## 2020-05-19 NOTE — Plan of Care (Signed)
  Problem: Education: Goal: Knowledge of General Education information will improve Description: Including pain rating scale, medication(s)/side effects and non-pharmacologic comfort measures Outcome: Progressing   Problem: Clinical Measurements: Goal: Ability to maintain clinical measurements within normal limits will improve Outcome: Progressing Goal: Will remain free from infection Outcome: Progressing Goal: Diagnostic test results will improve Outcome: Progressing Goal: Respiratory complications will improve Outcome: Progressing Goal: Cardiovascular complication will be avoided Outcome: Progressing   Problem: Activity: Goal: Risk for activity intolerance will decrease Outcome: Progressing   Problem: Nutrition: Goal: Adequate nutrition will be maintained Outcome: Progressing   Problem: Coping: Goal: Level of anxiety will decrease Outcome: Progressing   Problem: Elimination: Goal: Will not experience complications related to bowel motility Outcome: Progressing Goal: Will not experience complications related to urinary retention Outcome: Progressing   Problem: Safety: Goal: Ability to remain free from injury will improve Outcome: Progressing   Problem: Skin Integrity: Goal: Risk for impaired skin integrity will decrease Outcome: Progressing   Problem: Activity: Goal: Ability to tolerate increased activity will improve Outcome: Progressing   Problem: Respiratory: Goal: Ability to maintain a clear airway and adequate ventilation will improve Outcome: Progressing   Problem: Role Relationship: Goal: Method of communication will improve Outcome: Progressing

## 2020-05-19 NOTE — Procedures (Signed)
Extubation Procedure Note  Patient Details:   Name: [name] DOB: [birth year] MRN: [id number]   Airway Documentation:    Vent end date: 05/19/20 Vent end time: 1210   Evaluation  O2 sats: stable throughout Complications: No apparent complications Patient did tolerate procedure well. Bilateral Breath Sounds: Rhonchi   Yes  Pt extubated per physician order. Pt suctioned via ETT/orally prior and positive cuff leak. Upon extubation pt able to speak name, give a good cough and no stridor heard at this time. Pt placed on 6L nasal cannula with humidity. IS/flutter at bedside. RT will continue to monitor and be available as needed.  Sharla Kidney 05/19/2020, 12:15 PM

## 2020-05-19 NOTE — Progress Notes (Signed)
Hypoglycemic Event  CBG: 60  Treatment: 8 oz orange juice   Symptoms: None  Follow-up CBG: Time:1609 CBG Result: 75  Possible Reasons for Event: NPO    [name]

## 2020-05-20 DIAGNOSIS — J80 Acute respiratory distress syndrome: Secondary | ICD-10-CM | POA: Diagnosis not present

## 2020-05-20 DIAGNOSIS — E1069 Type 1 diabetes mellitus with other specified complication: Secondary | ICD-10-CM | POA: Diagnosis not present

## 2020-05-20 DIAGNOSIS — L97516 Non-pressure chronic ulcer of other part of right foot with bone involvement without evidence of necrosis: Secondary | ICD-10-CM | POA: Diagnosis not present

## 2020-05-20 DIAGNOSIS — M86271 Subacute osteomyelitis, right ankle and foot: Secondary | ICD-10-CM | POA: Diagnosis not present

## 2020-05-20 DIAGNOSIS — E10621 Type 1 diabetes mellitus with foot ulcer: Secondary | ICD-10-CM | POA: Diagnosis not present

## 2020-05-20 DIAGNOSIS — T8781 Dehiscence of amputation stump: Secondary | ICD-10-CM | POA: Diagnosis not present

## 2020-05-20 DIAGNOSIS — Z9889 Other specified postprocedural states: Secondary | ICD-10-CM

## 2020-05-20 LAB — BASIC METABOLIC PANEL
Anion gap: 12 (ref 5–15)
BUN: 37 mg/dL — ABNORMAL HIGH (ref 6–20)
CO2: 27 mmol/L (ref 22–32)
Calcium: 8.3 mg/dL — ABNORMAL LOW (ref 8.9–10.3)
Chloride: 98 mmol/L (ref 98–111)
Creatinine, Ser: 1.27 mg/dL — ABNORMAL HIGH (ref 0.44–1.00)
GFR, Estimated: 58 mL/min — ABNORMAL LOW (ref >60)
Glucose, Bld: 193 mg/dL — ABNORMAL HIGH (ref 70–99)
Potassium: 4.1 mmol/L (ref 3.5–5.1)
Sodium: 137 mmol/L (ref 135–145)

## 2020-05-20 LAB — CBC
HCT: 25.8 % — ABNORMAL LOW (ref 36.0–46.0)
Hemoglobin: 8.3 g/dL — ABNORMAL LOW (ref 12.0–15.0)
MCH: 29.2 pg (ref 26.0–34.0)
MCHC: 32.2 g/dL (ref 30.0–36.0)
MCV: 90.8 fL (ref 80.0–100.0)
Platelets: 221 10*3/uL (ref 150–400)
RBC: 2.84 MIL/uL — ABNORMAL LOW (ref 3.87–5.11)
RDW: 14.6 % (ref 11.5–15.5)
WBC: 7.6 10*3/uL (ref 4.0–10.5)
nRBC: 0 % (ref 0.0–0.2)

## 2020-05-20 LAB — GLUCOSE, CAPILLARY
Glucose-Capillary: 188 mg/dL — ABNORMAL HIGH (ref 70–99)
Glucose-Capillary: 60 mg/dL — ABNORMAL LOW (ref 70–99)
Glucose-Capillary: 159 mg/dL — ABNORMAL HIGH (ref 70–99)
Glucose-Capillary: 216 mg/dL — ABNORMAL HIGH (ref 70–99)
Glucose-Capillary: 162 mg/dL — ABNORMAL HIGH (ref 70–99)
Glucose-Capillary: 187 mg/dL — ABNORMAL HIGH (ref 70–99)

## 2020-05-20 LAB — PHOSPHORUS: Phosphorus: 3.5 mg/dL (ref 2.5–4.6)

## 2020-05-20 LAB — MAGNESIUM: Magnesium: 1.8 mg/dL (ref 1.7–2.4)

## 2020-05-20 LAB — PROCALCITONIN: Procalcitonin: 2.35 ng/mL

## 2020-05-20 LAB — CK: Total CK: 22 U/L — ABNORMAL LOW (ref 38–234)

## 2020-05-20 MED ORDER — OXYCODONE HCL 5 MG/5ML PO SOLN
5.0000 mg | ORAL | Status: DC
  Administered 2020-05-20: 5 mg via ORAL
  Filled 2020-05-20: qty 5

## 2020-05-20 MED ORDER — ONDANSETRON HCL 4 MG PO TABS: 4.0000 mg | ORAL_TABLET | Freq: Four times a day (QID) | ORAL | Status: DC | PRN

## 2020-05-20 MED ORDER — DOCUSATE SODIUM 50 MG/5ML PO LIQD: 100.0000 mg | Freq: Two times a day (BID) | ORAL | Status: DC | PRN

## 2020-05-20 MED ORDER — INSULIN ASPART 100 UNIT/ML ~~LOC~~ SOLN
0.0000 [IU] | Freq: Every day | SUBCUTANEOUS | Status: DC
  Administered 2020-05-21: 2 [IU] via SUBCUTANEOUS

## 2020-05-20 MED ORDER — CARVEDILOL 12.5 MG PO TABS
12.5000 mg | ORAL_TABLET | Freq: Two times a day (BID) | ORAL | Status: DC
Start: 2020-05-20 — End: 2020-05-24
  Administered 2020-05-20 – 2020-05-24 (×8): 12.5 mg via ORAL
  Filled 2020-05-20 (×8): qty 1

## 2020-05-20 MED ORDER — ADULT MULTIVITAMIN W/MINERALS CH
1.0000 | ORAL_TABLET | Freq: Every day | ORAL | Status: DC
  Administered 2020-05-21 – 2020-05-24 (×4): 1 via ORAL
  Filled 2020-05-20 (×4): qty 1

## 2020-05-20 MED ORDER — PANTOPRAZOLE SODIUM 40 MG PO TBEC
40.0000 mg | DELAYED_RELEASE_TABLET | Freq: Every day | ORAL | Status: DC
  Administered 2020-05-21 – 2020-05-24 (×4): 40 mg via ORAL
  Filled 2020-05-20 (×4): qty 1

## 2020-05-20 MED ORDER — POLYETHYLENE GLYCOL 3350 17 G PO PACK: 17.0000 g | PACK | Freq: Every day | ORAL | Status: DC | PRN

## 2020-05-20 MED ORDER — INSULIN ASPART 100 UNIT/ML ~~LOC~~ SOLN
0.0000 [IU] | Freq: Three times a day (TID) | SUBCUTANEOUS | Status: DC
  Administered 2020-05-20: 3 [IU] via SUBCUTANEOUS
  Administered 2020-05-20: 5 [IU] via SUBCUTANEOUS
  Administered 2020-05-21 (×2): 8 [IU] via SUBCUTANEOUS
  Administered 2020-05-21: 5 [IU] via SUBCUTANEOUS
  Administered 2020-05-22: 3 [IU] via SUBCUTANEOUS
  Administered 2020-05-22: 8 [IU] via SUBCUTANEOUS
  Administered 2020-05-22: 2 [IU] via SUBCUTANEOUS
  Administered 2020-05-23: 8 [IU] via SUBCUTANEOUS

## 2020-05-20 MED ORDER — ONDANSETRON HCL 4 MG/2ML IJ SOLN
4.0000 mg | Freq: Four times a day (QID) | INTRAMUSCULAR | Status: DC | PRN
  Administered 2020-05-22 – 2020-05-24 (×2): 4 mg via INTRAVENOUS
  Filled 2020-05-20 (×2): qty 2

## 2020-05-20 MED ORDER — ACETAMINOPHEN 500 MG PO TABS
500.0000 mg | ORAL_TABLET | Freq: Four times a day (QID) | ORAL | Status: DC | PRN
  Administered 2020-05-23: 500 mg via ORAL
  Filled 2020-05-20: qty 1

## 2020-05-20 MED ORDER — OXYCODONE HCL 5 MG PO TABS
5.0000 mg | ORAL_TABLET | ORAL | Status: DC
  Administered 2020-05-20 – 2020-05-21 (×5): 5 mg via ORAL
  Filled 2020-05-20 (×5): qty 1

## 2020-05-20 MED ORDER — GABAPENTIN 300 MG PO CAPS
300.0000 mg | ORAL_CAPSULE | Freq: Every day | ORAL | Status: DC
  Administered 2020-05-20 – 2020-05-23 (×4): 300 mg via ORAL
  Filled 2020-05-20 (×4): qty 1

## 2020-05-20 NOTE — Plan of Care (Signed)
  Problem: Education: Goal: Knowledge of General Education information will improve Description: Including pain rating scale, medication(s)/side effects and non-pharmacologic comfort measures Outcome: Progressing   Problem: Health Behavior/Discharge Planning: Goal: Ability to manage health-related needs will improve Outcome: Progressing   Problem: Clinical Measurements: Goal: Ability to maintain clinical measurements within normal limits will improve Outcome: Progressing Goal: Will remain free from infection Outcome: Progressing Goal: Diagnostic test results will improve Outcome: Progressing Goal: Respiratory complications will improve Outcome: Progressing Goal: Cardiovascular complication will be avoided Outcome: Progressing   Problem: Activity: Goal: Risk for activity intolerance will decrease Outcome: Progressing   Problem: Nutrition: Goal: Adequate nutrition will be maintained Outcome: Progressing   Problem: Coping: Goal: Level of anxiety will decrease Outcome: Progressing   Problem: Elimination: Goal: Will not experience complications related to bowel motility Outcome: Progressing Goal: Will not experience complications related to urinary retention Outcome: Progressing   Problem: Pain Managment: Goal: General experience of comfort will improve Outcome: Progressing   Problem: Safety: Goal: Ability to remain free from injury will improve Outcome: Progressing   Problem: Skin Integrity: Goal: Risk for impaired skin integrity will decrease Outcome: Progressing   Problem: Activity: Goal: Ability to tolerate increased activity will improve Outcome: Progressing   Problem: Respiratory: Goal: Ability to maintain a clear airway and adequate ventilation will improve Outcome: Progressing   Problem: Role Relationship: Goal: Method of communication will improve Outcome: Progressing

## 2020-05-20 NOTE — Progress Notes (Signed)
NAME:  [name], MRN:  [id number], DOB:  November 12, 1990, LOS: 8 ADMISSION DATE:  05/12/2020, CONSULTATION DATE:  05/13/20 REFERRING MD:  [facility], CHIEF COMPLAINT:  Hypoxia   Brief History   [name] is a 30 yo female with a pPMH of T1DM, COVID 3x, HTN, CKD (Stage 3), and osteomyelitis s/p Rt ray amputation by Dr. [name] (04/13/20). Subsequently went to [facility] with [facility] in right foot where she underwent I&D and VAC placement and transferred to [facility]. Clinical course complicated by increased O2 demand on BIPAP resulting in intubation. CXR showed  extensive bilateral airspace disease 2/2 to aspiration vs infection. She has since failed extubation.   Significant Hospital Events   05/13/2020 transferred to intensive care unit 05/13/2020 intubated 05/13/2020 Bronchoscopy 05/25/20 extubated 05/16/20 re-intubated due to hypoxia and bradycardia  Consults:  PCCM, ID, Ortho  Procedures:  02/13 Intubated - 02/15 02/16 Re-intubated>> 2/19  Significant Diagnostic Tests:  02/13 CXR: The enteric tube projects over the gastric body.  02/14 CXR: 1. Unremarkable hardware. 2. Severe airspace disease with mildly improved lung volumes.   2/13 CXR: 1. Slight interval worsening of the bilateral hazy airspace opacities. 2. Support lines and tubes as above.  1. Significantly worsened diffuse bilateral airspace disease may reflect pneumonia, edema, or ARDS. 2. Unchanged borderline cardiomegaly.   02/13 ECHOCARDIOGRAM COMPLETE 1. Left ventricular ejection fraction, by estimation, is 60 to 65%. The left ventricle has normal function. The left ventricle has no regional wall motion abnormalities. There is mild concentric left ventricular hypertrophy. Left ventricular diastolic parameters are indeterminate.   2. Right ventricular systolic function is normal. The right ventricular size is normal. There is moderately elevated pulmonary artery systolic pressure.   3. The mitral valve is normal in  structure. Trivial mitral valve regurgitation.  4. Tricuspid valve regurgitation is mild to moderate.   5. The aortic valve is normal in structure. Aortic valve regurgitation is not visualized. No aortic stenosis is present.   6. The inferior vena cava is normal in size with greater than 50% respiratory variability, suggesting right atrial pressure of 3 mmHg.   02/14 CXR: 1. Unremarkable hardware. 2. Severe airspace disease with mildly improved lung volumes.  02/16 CXR: 1. Tube and catheter positions as described without evident pneumothorax.  2. Multifocal airspace opacity with less opacity in the right apex compared to previous study and multifocal airspace opacity elsewhere essentially stable. No new opacity evident. Appearance consistent with multifocal pneumonia. Correlation with COVID-19 status may be advisable.  3. Note that noncardiogenic edema and ARDS are differential considerations. More than one of these entities may present concurrently. Stable cardiac silhouette.  2/16 Abd XR: OG tube tip in the stomach.   Micro Data:  05/12/2020 blood cultures x2 05/12/2020 Covid negative 2/13 Broch Alveolar Lavage - moderate WBC mostly PMN, no organisms  2/13 S pneumo - negative 2/13 BCID - negative  2/13 CDiff - negative  2/14 BCx - NG 2d 2/16 Tracheal aspiration - pending Previous surgical cultures positive for MRSA.  Antimicrobials:  05/12/2020 daptomycin - 05/14/2020 05/12/2020 Unasyn - 05/14/20 05/14/20 Zosyn - 2/15 02/17 Unasyn - 2/16 05/13/20 Linezolid >> 2/18 02/18 Ceftriaxone>> Daptomycin 2/19 >>   Interim history/subjective:    Successfully extubated 2/19, remains on 6 L/min, desaturates with conversation I/O+ 2.2 L total, urine output 2900 cc last 24 hours Serum creatinine continues to improve  Objective   Blood pressure 127/64, pulse 92, temperature 99 F (37.2 C), temperature source Oral, resp. rate 18,  height _0  (1.702 m), weight 80.9 kg, SpO2 94 %.     FiO2 (%):  [40 %] 40 %   Intake/Output Summary (Last 24 hours) at 05/20/2020 1696 Last data filed at 05/20/2020 0500 Gross per 24 hour  Intake 1273.52 ml  Output 2870 ml  Net -1596.48 ml   Filed Weights   05/17/20 0334 05/18/20 0035 05/19/20 0426  Weight: 83.5 kg 83.6 kg 80.9 kg    Examination: General: Thin woman, chronically ill, awake, alert, extubated HENT: Oropharynx clear, pupils equal Lungs: Distant, few scattered crackles, no wheeze Cardiovascular: Regular, distant, no murmur Abdomen: Soft, nondistended, positive bowel sounds Extremities: Right foot dressing in place Neuro: Awake, alert, interacts appropriately, follows commands, good cough strength   Resolved Hospital Problem list     Assessment & Plan:   Acute hypoxic respiratory failure, HAP vs aspiration  Bilateral airspace opacities, suspect ARDS. Hx of COVID-19 pneumonia, 04/05/2020, 09/24/2019, 05/21/2019 Reintubated 2/16 -Linezolid and ceftriaxone completed 2/19, continue antibiotics as below -Continue diuresis as she can tolerate -BAL cultures negative -Push pulmonary hygiene -Wean FiO2 as able.  Currently on 6 L and desaturates when speaking. 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 [name]) Subsequent I&D at [facility] with wound [facility] application Orthopedic surgery consulted for further evaluation and management. On broad spectrum AB. Patient will switch back to Daptomycin and Ceftriaxone for an end date of 3/16.  -Antibiotics transitioned to ceftriaxone, daptomycin on 2/19 for lower extremity osteomyelitis -Pain management  Acute on CKD (Stage 3), pre-renal Proteinuria: Oliguric:   Patient received IV lasix overnight with ~ 1 L out. She had improvement of her Cr 1.89>2.59>2.37>2.21. Morning labs pending. Will  follow closely. -Continue to follow serum creatinine, improving and tolerating aggressive diuresis -Continue Lasix dosing as ordered 2/20, dose daily depending on renal function -Renal dose medications, avoid nephrotoxins -Follow urine output, BMP  Hypervolemia, improving -Continue Lasix 80 mg every 12 hours for now, assess daily -Follow clinical exam, chest x-ray   Protein caloric malnutrition: Generalized weakness: -Carb modified diet  Best practice (evaluated daily)  Diet: Carb modified diet Pain/Anxiety/Delirium protocol (if indicated): Fentanyl as needed VAP protocol (if indicated): indicated DVT prophylaxis: heparin subcutaneous GI prophylaxis: ppi Glucose control: ssi Mobility: PT/OT Disposition: icu  Goals of Care:  Last date of multidisciplinary goals of care discussion: n/a Family and staff present: patient's partner was at bed side. Summary of discussion: discuss current treatment plan Follow up goals of care discussion due: end of the week  Code Status: full   Labs   CBC: Recent Labs  Lab 05/14/20 0530 05/15/20 0306 05/16/20 1111 05/17/20 0915 05/17/20 1233 05/18/20 0630 05/19/20 0405 05/20/20 0338  WBC 2.9* 7.0  --  6.6  --  7.0 7.4 7.6  NEUTROABS 2.1  --   --   --   --   --   --   --   HGB 8.9* 8.6*   < > 7.6* 7.5* 7.3* 8.1* 8.3*  HCT 26.6* 27.6*   < > 24.5* 22.0* 24.1* 25.2* 25.8*  MCV 89.6 91.1  --  92.1  --  93.8 92.0 90.8  PLT 135* 185  --  196  --  180 203 221   < > = values in this interval not displayed.    Basic Metabolic Panel: Recent  Labs  Lab 05/16/20 0624 05/16/20 1111 05/16/20 1131 05/16/20 1509 05/17/20 0500 05/17/20 1233 05/17/20 1753 05/18/20 0614 05/19/20 0240 05/19/20 1030 05/20/20 0338  NA 137   < >  --   --  135 137  --  138  --  140 137  K 3.6   < >  --   --  3.3* 4.3  --  3.5  --  3.9 4.1  CL 100  --   --   --  99  --   --  105  --  104 98  CO2 23  --   --   --  23  --   --  25  --  27 27  GLUCOSE 61*  --   --    --  200*  --   --  107*  --  162* 193*  BUN 39*  --   --   --  41*  --   --  43*  --  43* 37*  CREATININE 2.37*  --   --   --  2.21*  --   --  1.86* 1.75* 1.48* 1.27*  CALCIUM 7.8*  --   --   --  7.6*  --   --  7.8*  --  8.0* 8.3*  MG 1.8  --  1.8 1.7 1.8  --  2.1 2.1  --  2.0 1.8  PHOS 4.8*  --  5.0* 4.3 4.6  --  4.1  --   --   --  3.5   < > = values in this interval not displayed.   GFR: Estimated Creatinine Clearance: 70.9 mL/min (A) (by C-G formula based on SCr of 1.27 mg/dL (H)). Recent Labs  Lab 05/14/20 0530 05/15/20 0306 05/17/20 0915 05/18/20 0630 05/18/20 1532 05/19/20 0240 05/19/20 0405 05/20/20 0338  PROCALCITON 14.62  --   --   --  5.67 4.45  --  2.35  WBC 2.9*   < > 6.6 7.0  --   --  7.4 7.6   < > = values in this interval not displayed.    Liver Function Tests: Recent Labs  Lab 05/14/20 0530 05/16/20 0624  AST 78* 37  ALT 77* 44  ALKPHOS 134* 129*  BILITOT 1.8* 1.7*  PROT 5.5* 6.0*  ALBUMIN 1.2* 1.1*   No results for input(s): LIPASE, AMYLASE in the last 168 hours. No results for input(s): AMMONIA in the last 168 hours.  ABG    Component Value Date/Time   PHART 7.374 05/17/2020 1233   PCO2ART 46.3 05/17/2020 1233   PO2ART 92 05/17/2020 1233   HCO3 26.9 05/17/2020 1233   TCO2 28 05/17/2020 1233   ACIDBASEDEF 1.9 05/13/2020 0825   O2SAT 97.0 05/17/2020 1233     Coagulation Profile: No results for input(s): INR, PROTIME in the last 168 hours.  Cardiac Enzymes: Recent Labs  Lab 05/20/20 0338  CKTOTAL 22*    HbA1C: Hemoglobin A1C  Date/Time Value Ref Range Status  02/29/2020 03:58 PM 9.0 (A) 4.0 - 5.6 % Final   Hgb A1c MFr Bld  Date/Time Value Ref Range Status  04/10/2020 11:22 AM 8.6 (H) 4.8 - 5.6 % Final    Comment:    (NOTE) Pre diabetes:          5.7%-6.4%  Diabetes:              >6.4%  Glycemic control for   <7.0% adults with diabetes   09/25/2019 04:38 AM 8.3 (H) 4.8 -  5.6 % Final    Comment:    (NOTE) Pre diabetes:           5.7%-6.4%  Diabetes:              >6.4%  Glycemic control for   <7.0% adults with diabetes     CBG: Recent Labs  Lab 05/19/20 1950 05/19/20 2316 05/20/20 0320 05/20/20 0322 05/20/20 0712  GLUCAP 276* 236* 60* 188* 159*    Critical care time: 31 min     Baltazar Apo, MD, PhD 05/20/2020, 8:28 AM [facility] [phone] or if no answer before 7:00PM call [phone] For any issues after 7:00PM please call eLink [phone]

## 2020-05-20 NOTE — Progress Notes (Signed)
[facility] Infectious Diseases Follow Up Note  Patient Identification: Patient Name: [name] MRN: [id number] [place] Date: 05/12/2020 12:22 AM Age: 30 y.o.Today's Date: 05/20/2020   Reason for Visit: PNA and rt foot dehiscence   Principal Problem:   Chronic anemia Active Problems:   Hypertension associated with diabetes ([place])   Diabetes mellitus type 1 ([place])   Severe protein-calorie malnutrition ([place])   Osteomyelitis ([place])   GI bleeding   AKI (acute kidney injury) ([place])   Diarrhea   Acute upper GI bleed   Respiratory failure ([place])   Malnutrition of moderate degree   ARDS (adult respiratory distress syndrome) ([place])  Antibiotics:Unasyn 2/12-2/17                    linezolid 2/13- 2/18                     Ceftriaxone 2/17-        Daptomycin 2/19- Daptomycin prior to admit - 2/12 Ciprofloxacin prior to admit - 2/12   Lines/Tubes:picc+  Interval Events: extubated, afebrile, no leukocytosis, hemodynamically stable    Assessment #Wound dehiscence at the site of 1st ray amputation -DFU-osteo 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 CPK 22 ESR and CRP weekly  A follow up with [facility] will be made for monitoring IV antibiotics  Will sign off for now   Rest of the management as per the primary team. Thank you for the consult. Please page with pertinent questions or  concerns.  ______________________________________________________________________ Subjective patient seen and examined at the bedside. She is on [place] and is eating. Feels better and no complaints   Vitals BP (!) 143/81   Pulse 88   Temp 99 F (37.2 C) (Oral)   Resp 14   Ht _0  (1.702 m)   Wt 80.9 kg   SpO2 100%   BMI 27.93 kg/m   Physical Exam Appears comfortable, on [place] HEENT WNL Chest - coarse breath sounds CVS- Normal s1s2, RRR Abdomen - soft  Extremities - minimal pedal edema, Rt 1st ray amputated site looks dry and necrotic Skin - no obvious rashes   Pertinent Microbiology Results for orders placed or performed during the hospital encounter of 05/12/20  SARS CORONAVIRUS 2 (TAT 6-24 HRS) Nasopharyngeal Nasopharyngeal Swab     Status: None   Collection Time: 05/12/20  8:21 AM   Specimen: Nasopharyngeal Swab  Result Value Ref Range Status   SARS Coronavirus 2 NEGATIVE NEGATIVE Final    Comment: (NOTE) SARS-CoV-2 target nucleic acids are NOT DETECTED.  The SARS-CoV-2 RNA is generally detectable in upper and lower respiratory specimens during the acute phase of infection. Negative results do not preclude SARS-CoV-2 infection, do not rule out co-infections with other pathogens, and should not be used as the sole basis for treatment or other patient management decisions. Negative results must be combined with clinical observations, patient history, and epidemiological information. The expected result is Negative.  Fact Sheet for  Patients: SugarRoll.be  Fact Sheet for Healthcare Providers: https://www.woods-mathews.com/  This test is not yet approved or cleared by the [place] FDA and  has been authorized for detection and/or diagnosis of SARS-CoV-2 by FDA under an Emergency Use Authorization (EUA). This EUA will remain  in effect (meaning this test can be used) for the duration of the COVID-19 declaration under Se ction  564(b)(1) of the Act, 21 U.S.C. section 360bbb-3(b)(1), unless the authorization is terminated or revoked sooner.  Performed at [facility], [place] [address] [place], [place] [address]   C Difficile Quick Screen w PCR reflex     Status: None   Collection Time: 05/13/20  6:27 AM   Specimen: STOOL  Result Value Ref Range Status   C Diff antigen NEGATIVE NEGATIVE Final   C Diff toxin NEGATIVE NEGATIVE Final   C Diff interpretation No C. difficile detected.  Final    Comment: Performed at [facility], [place] [address], [place], [place] [address]  Respiratory (~20 pathogens) panel by PCR     Status: None   Collection Time: 05/13/20  3:45 PM   Specimen: Nasopharyngeal Swab; Respiratory  Result Value Ref Range Status   Adenovirus NOT DETECTED NOT DETECTED Final   Coronavirus 229E NOT DETECTED NOT DETECTED Final    Comment: (NOTE) The Coronavirus on the Respiratory Panel, DOES NOT test for the novel  Coronavirus (2019 nCoV)    Coronavirus HKU1 NOT DETECTED NOT DETECTED Final   Coronavirus NL63 NOT DETECTED NOT DETECTED Final   Coronavirus OC43 NOT DETECTED NOT DETECTED Final   Metapneumovirus NOT DETECTED NOT DETECTED Final   Rhinovirus / Enterovirus NOT DETECTED NOT DETECTED Final   Influenza A NOT DETECTED NOT DETECTED Final   Influenza B NOT DETECTED NOT DETECTED Final   Parainfluenza Virus 1 NOT DETECTED NOT DETECTED Final   Parainfluenza Virus 2 NOT DETECTED NOT DETECTED Final   Parainfluenza Virus 3 NOT DETECTED NOT DETECTED Final   Parainfluenza Virus 4 NOT DETECTED NOT DETECTED Final   Respiratory Syncytial Virus NOT DETECTED NOT DETECTED Final   Bordetella pertussis NOT DETECTED NOT DETECTED Final   Bordetella Parapertussis NOT DETECTED NOT DETECTED Final   Chlamydophila pneumoniae NOT DETECTED NOT DETECTED Final   Mycoplasma pneumoniae NOT DETECTED NOT DETECTED Final    Comment: Performed at [facility], [place]. [address] [place], [place] [address]   Culture, respiratory     Status: None   Collection Time: 05/13/20  6:04 PM   Specimen: Bronchoalveolar Lavage; Respiratory  Result Value Ref Range Status   Specimen Description BRONCHIAL ALVEOLAR LAVAGE  Final   Special Requests NONE  Final   Gram Stain   Final    MODERATE WBC PRESENT, PREDOMINANTLY PMN NO ORGANISMS SEEN    Culture   Final    NO GROWTH Performed at [facility], [address] [place], [place] [address]    Report Status 05/16/2020 FINAL  Final  Acid Fast Smear (AFB)     Status: None   Collection Time: 05/13/20  6:04 PM   Specimen: Bronchial Alveolar Lavage  Result Value Ref Range Status   AFB Specimen Processing Concentration  Final   Acid Fast Smear Negative  Final    Comment: (NOTE) Performed At: [facility] [place], [place] [id number] Rush Farmer MD YB:[id number]    Source (AFB) BRONCHIAL ALVEOLAR LAVAGE  Final    Comment: Performed at [facility], [place] [address]  [place], [place] [address]  Fungus Culture With Stain     Status: None (Preliminary result)   Collection Time: 05/13/20  6:04 PM   Specimen: Bronchial Alveolar Lavage  Result Value Ref Range Status   Fungus Stain Final report  Final    Comment: (NOTE) Performed At: [facility] [place], [place] [id number] Rush Farmer MD OE:[id number]    Fungus (Mycology) Culture PENDING  Incomplete   Fungal Source BRONCHIAL ALVEOLAR LAVAGE  Final    Comment: Performed at [facility], [place] [address], [place], [place] [address]  Fungus Culture Result     Status: None   Collection Time: 05/13/20  6:04 PM  Result Value Ref Range Status   Result 1 Comment  Final    Comment: (NOTE) KOH/Calcofluor preparation:  no fungus observed. Performed At: [facility] [place], [place] [id number] Rush Farmer MD YB:[id number]   Culture, blood (routine x 2)     Status: None   Collection Time: 05/14/20  4:06 PM   Specimen:  BLOOD  Result Value Ref Range Status   Specimen Description BLOOD LEFT ANTECUBITAL  Final   Special Requests   Final    BOTTLES DRAWN AEROBIC AND ANAEROBIC Blood Culture adequate volume   Culture   Final    NO GROWTH 5 DAYS Performed at [facility], [address], [place], [place] [address]    Report Status 05/19/2020 FINAL  Final  Culture, blood (routine x 2)     Status: None   Collection Time: 05/14/20  4:12 PM   Specimen: BLOOD LEFT FOREARM  Result Value Ref Range Status   Specimen Description BLOOD LEFT FOREARM  Final   Special Requests   Final    BOTTLES DRAWN AEROBIC ONLY Blood Culture results may not be optimal due to an inadequate volume of blood received in culture bottles   Culture   Final    NO GROWTH 5 DAYS Performed at [facility], [place] [address] [place], [place] [address]    Report Status 05/19/2020 FINAL  Final    Pertinent Lab. CBC Latest Ref Rng & Units 05/20/2020 05/19/2020 05/18/2020  WBC 4.0 - 10.5 K/uL 7.6 7.4 7.0  Hemoglobin 12.0 - 15.0 g/dL 8.3(L) 8.1(L) 7.3(L)  Hematocrit 36.0 - 46.0 % 25.8(L) 25.2(L) 24.1(L)  Platelets 150 - 400 K/uL 221 203 180   CMP Latest Ref Rng & Units 05/20/2020 05/19/2020 05/19/2020  Glucose 70 - 99 mg/dL 193(H) 162(H) -  BUN 6 - 20 mg/dL 37(H) 43(H) -  Creatinine 0.44 - 1.00 mg/dL 1.27(H) 1.48(H) 1.75(H)  Sodium 135 - 145 mmol/L 137 140 -  Potassium 3.5 - 5.1 mmol/L 4.1 3.9 -  Chloride 98 - 111 mmol/L 98 104 -  CO2 22 - 32 mmol/L 27 27 -  Calcium 8.9 - 10.3 mg/dL 8.3(L) 8.0(L) -  Total Protein 6.5 - 8.1 g/dL - - -  Total Bilirubin 0.3 - 1.2 mg/dL - - -  Alkaline Phos 38 - 126 U/L - - -  AST 15 - 41 U/L - - -  ALT 0 - 44 U/L - - -    Pertinent Imaging today Plain films and CT images have been personally visualized and interpreted; radiology reports have been reviewed. Decision making incorporated into the Impression / Recommendations.  I have spent approx 30 minutes for this patient encounter including review  of prior medical records with greater than 50% of time being face to face and coordination  of their care.  Electronically signed by:   [name], MD Infectious Disease Physician [facility] for Infectious Disease Pager: [phone]

## 2020-05-21 ENCOUNTER — Inpatient Hospital Stay (HOSPITAL_COMMUNITY): Payer: 59

## 2020-05-21 DIAGNOSIS — D649 Anemia, unspecified: Secondary | ICD-10-CM | POA: Diagnosis not present

## 2020-05-21 LAB — CBC
HCT: 26.9 % — ABNORMAL LOW (ref 36.0–46.0)
Hemoglobin: 8.3 g/dL — ABNORMAL LOW (ref 12.0–15.0)
MCH: 28.2 pg (ref 26.0–34.0)
MCHC: 30.9 g/dL (ref 30.0–36.0)
MCV: 91.5 fL (ref 80.0–100.0)
Platelets: 191 10*3/uL (ref 150–400)
RBC: 2.94 MIL/uL — ABNORMAL LOW (ref 3.87–5.11)
RDW: 14 % (ref 11.5–15.5)
WBC: 9.6 10*3/uL (ref 4.0–10.5)
nRBC: 0 % (ref 0.0–0.2)

## 2020-05-21 LAB — GLUCOSE, CAPILLARY
Glucose-Capillary: 259 mg/dL — ABNORMAL HIGH (ref 70–99)
Glucose-Capillary: 240 mg/dL — ABNORMAL HIGH (ref 70–99)
Glucose-Capillary: 263 mg/dL — ABNORMAL HIGH (ref 70–99)
Glucose-Capillary: 243 mg/dL — ABNORMAL HIGH (ref 70–99)

## 2020-05-21 LAB — BASIC METABOLIC PANEL
Anion gap: 7 (ref 5–15)
BUN: 28 mg/dL — ABNORMAL HIGH (ref 6–20)
CO2: 30 mmol/L (ref 22–32)
Calcium: 8.3 mg/dL — ABNORMAL LOW (ref 8.9–10.3)
Chloride: 95 mmol/L — ABNORMAL LOW (ref 98–111)
Creatinine, Ser: 1.24 mg/dL — ABNORMAL HIGH (ref 0.44–1.00)
GFR, Estimated: >60 mL/min (ref >60)
Glucose, Bld: 275 mg/dL — ABNORMAL HIGH (ref 70–99)
Potassium: 4.2 mmol/L (ref 3.5–5.1)
Sodium: 132 mmol/L — ABNORMAL LOW (ref 135–145)

## 2020-05-21 LAB — MAGNESIUM: Magnesium: 1.8 mg/dL (ref 1.7–2.4)

## 2020-05-21 IMAGING — DX DG CHEST 1V PORT
1 series · 1 of 1 positions shown · non-contrast
Comparison: Two days ago

CLINICAL DATA: Acute on chronic respiratory failure

EXAM:
PORTABLE CHEST 1 VIEW

[chest ap]
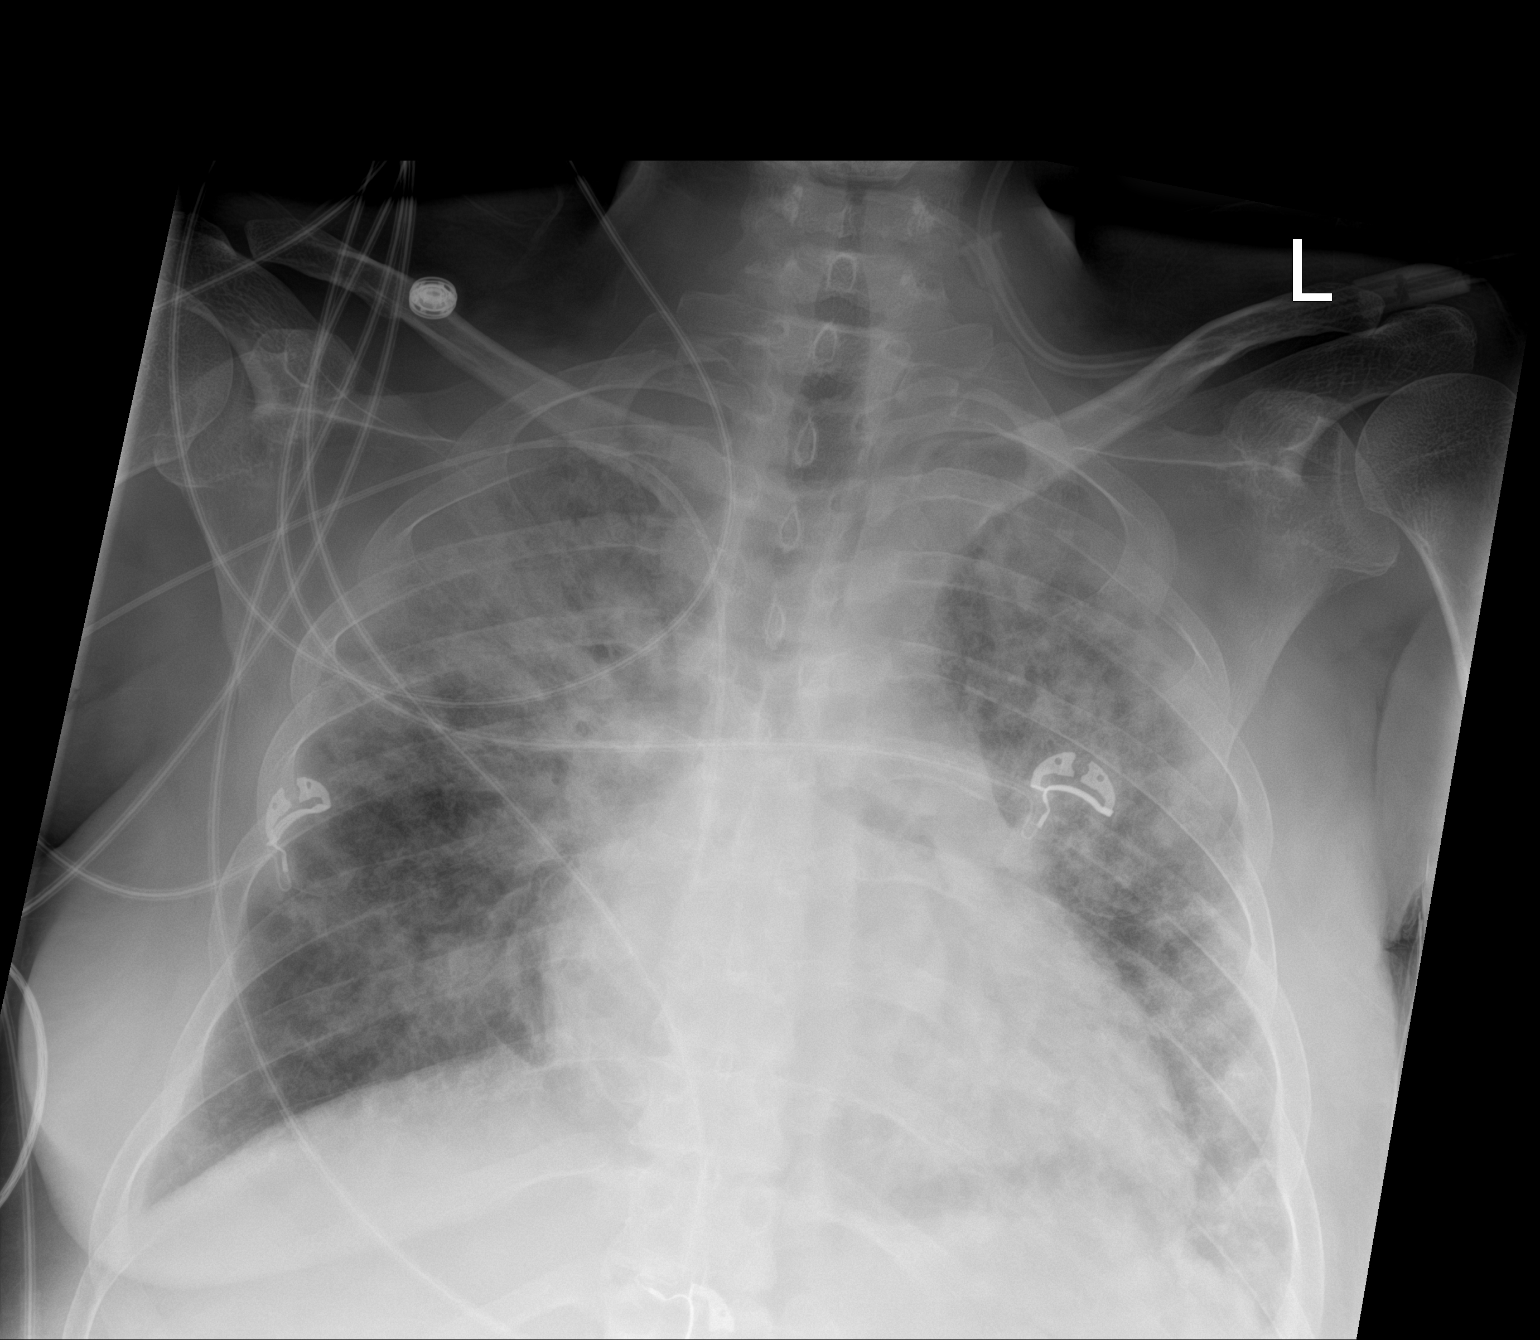

[1 of 1 positions shown; findings below may reference images not displayed]

FINDINGS: Tracheal and esophageal extubation. Pulmonary opacity which is now
greater in the upper lung zones. Mild cardiomegaly. No visible
effusion or pneumothorax. Right PICC with tip at the upper
cavoatrial junction
IMPRESSION: Extubation with similar lung volumes.

Interval change to upper lung zone predominant opacification,
question interval edema.

## 2020-05-21 MED ORDER — BOOST / RESOURCE BREEZE PO LIQD CUSTOM
1.0000 | Freq: Two times a day (BID) | ORAL | Status: DC
  Administered 2020-05-21 – 2020-05-24 (×6): 1 via ORAL

## 2020-05-21 MED ORDER — INSULIN GLARGINE 100 UNIT/ML ~~LOC~~ SOLN
8.0000 [IU] | Freq: Every day | SUBCUTANEOUS | Status: DC
  Administered 2020-05-22: 8 [IU] via SUBCUTANEOUS
  Filled 2020-05-21 (×3): qty 0.08

## 2020-05-21 MED ORDER — OXYCODONE HCL 5 MG PO TABS
5.0000 mg | ORAL_TABLET | Freq: Four times a day (QID) | ORAL | Status: DC
  Administered 2020-05-21 – 2020-05-24 (×12): 5 mg via ORAL
  Filled 2020-05-21 (×12): qty 1

## 2020-05-21 MED ORDER — FUROSEMIDE 10 MG/ML IJ SOLN: 80.0000 mg | Freq: Every day | INTRAMUSCULAR | Status: DC

## 2020-05-21 NOTE — Progress Notes (Addendum)
Nutrition Follow-up  DOCUMENTATION CODES:   Non-severe (moderate) malnutrition in context of social or environmental circumstances  INTERVENTION:    Boost Breeze po BID, each supplement provides 250 kcal and 9 grams of protein  MVI with minerals po daily  NUTRITION DIAGNOSIS:   Moderate Malnutrition related to social / environmental circumstances (difficulty chewing due to poor dentition) as evidenced by mild fat depletion,mild muscle depletion.  Ongoing   GOAL:   Patient will meet greater than or equal to 90% of their needs  Progressing  MONITOR:   PO intake,Supplement acceptance,Skin,Labs  REASON FOR ASSESSMENT:   Ventilator,Consult Enteral/tube feeding initiation and management  ASSESSMENT:   30 yo female admitted with suspected aspiration, sepsis r/t foot infection, ARDS. 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   Discussed patient in ICU rounds and with RN today. Extubated 2/19. Currently on a CHO modified diet. Meal intakes 50-100%  Patient reports good appetite. She ate 100% of breakfast this morning (tray at bedside). She is willing to try PO supplements, likes Boost Breeze, not the milky supplements.   Labs reviewed. Na 132 CBG: 259-240  Medications reviewed and include Novolog SSI QID, Lantus, MVI with minerals, Protonix.   Weight 71 kg on admission, 80.9 kg today Increased weight related to edema. UOP 3150 ml x 24 hours   Diet Order:   Diet Order            Diet Carb Modified Fluid consistency: Thin; Room service appropriate? Yes  Diet effective now                 EDUCATION NEEDS:   Not appropriate for education at this time  Skin:  Skin Assessment: Skin Integrity Issues: Skin Integrity Issues:: Other (Comment) Other: R foot osteomyelitis  Last BM:  2/21 300 ml via rectal tube x 24 hours  Height:   Ht Readings from Last 1  Encounters:  05/12/20 5' 7"  (1.702 m)    Weight:   Wt Readings from Last 1 Encounters:  05/19/20 80.9 kg    Ideal Body Weight:  61.4 kg  BMI:  Body mass index is 27.93 kg/m.  Estimated Nutritional Needs:   Kcal:  1900-2100  Protein:  80-100 gm  Fluid:  1.9-2.1 L    Lucas Mallow, RD, LDN, CNSC Please refer to Amion for contact information.

## 2020-05-21 NOTE — Progress Notes (Addendum)
NAME:  [name], MRN:  [id number], DOB:  1990-09-12, LOS: 9 ADMISSION DATE:  05/12/2020, CONSULTATION DATE:  05/13/20 REFERRING MD:  [place], CHIEF COMPLAINT:  Hypoxia   Brief History   [name] is a 30 yo female with a pPMH of T1DM, COVID 3x, HTN, CKD (Stage 3), and osteomyelitis s/p Rt ray amputation by Dr. [name] (04/13/20). Subsequently went to [facility] with pain in right foot where she underwent I&D and VAC placement and transferred to [facility]. Clinical course complicated by increased O2 demand on BIPAP resulting in intubation. CXR showed  extensive bilateral airspace disease 2/2 to aspiration vs infection. She has since failed extubation.   Significant Hospital Events   05/13/2020 transferred to intensive care unit 05/13/2020 intubated 05/13/2020 Bronchoscopy 05/16/20 extubated 05/16/20 re-intubated due to hypoxia and bradycardia 05/19/20 extubated  Consults:  PCCM, ID, Ortho  Procedures:  02/13 Intubated - 02/15 02/16 Re-intubated>> 2/19  Significant Diagnostic Tests:  02/13 CXR: The enteric tube projects over the gastric body.  02/14 CXR: 1. Unremarkable hardware. 2. Severe airspace disease with mildly improved lung volumes.   2/13 CXR: 1. Slight interval worsening of the bilateral hazy airspace opacities. 2. Support lines and tubes as above.  1. Significantly worsened diffuse bilateral airspace disease may reflect pneumonia, edema, or ARDS. 2. Unchanged borderline cardiomegaly.   02/13 ECHOCARDIOGRAM COMPLETE 1. Left ventricular ejection fraction, by estimation, is 60 to 65%. The left ventricle has normal function. The left ventricle has no regional wall motion abnormalities. There is mild concentric left ventricular hypertrophy. Left ventricular diastolic parameters are indeterminate.   2. Right ventricular systolic function is normal. The right ventricular size is normal. There is moderately elevated pulmonary artery systolic pressure.   3. The mitral valve is  normal in structure. Trivial mitral valve regurgitation.  4. Tricuspid valve regurgitation is mild to moderate.   5. The aortic valve is normal in structure. Aortic valve regurgitation is not visualized. No aortic stenosis is present.   6. The inferior vena cava is normal in size with greater than 50% respiratory variability, suggesting right atrial pressure of 3 mmHg.   02/14 CXR: 1. Unremarkable hardware. 2. Severe airspace disease with mildly improved lung volumes.  02/16 CXR: 1. Tube and catheter positions as described without evident pneumothorax.  2. Multifocal airspace opacity with less opacity in the right apex compared to previous study and multifocal airspace opacity elsewhere essentially stable. No new opacity evident. Appearance consistent with multifocal pneumonia. Correlation with COVID-19 status may be advisable.  3. Note that noncardiogenic edema and ARDS are differential considerations. More than one of these entities may present concurrently. Stable cardiac silhouette.  2/16 Abd XR: OG tube tip in the stomach.  2/21: CXR: Extubation with similar lung volumes.  Interval change to upper lung zone predominant opacification, question interval edema.  Micro Data:  05/12/2020 blood cultures x2 05/12/2020 Covid negative 2/13 Broch Alveolar Lavage - moderate WBC mostly PMN, no organisms  2/13 S pneumo - negative 2/13 BCID - negative  2/13 CDiff - negative  2/14 BCx - NG 2d 2/16 Tracheal aspiration - pending Previous surgical cultures positive for MRSA.   Antimicrobials:  05/12/2020 daptomycin - 05/14/2020 05/12/2020 Unasyn - 05/14/20 05/14/20 Zosyn - 2/15 02/17 Unasyn - 2/16 05/13/20 Linezolid >> 2/18 02/18 Ceftriaxone>> Daptomycin 2/19 >>   Interim history/subjective:    No events overnight.   Awake and alert. She denies any new complaints. Good urine output overnight.   Objective   Blood pressure [place] Kitchen)  144/78, pulse 90, temperature 99.1 F (37.3 C),  temperature source Axillary, resp. rate (!) 21, height _0  (1.702 m), weight 80.9 kg, SpO2 96 %.        Intake/Output Summary (Last 24 hours) at 05/21/2020 0552 Last data filed at 05/21/2020 0445 Gross per 24 hour  Intake 1100 ml  Output 3450 ml  Net -2350 ml   Filed Weights   05/17/20 0334 05/18/20 0035 05/19/20 0426  Weight: 83.5 kg 83.6 kg 80.9 kg    Examination: General: Thin woman, chronically ill, awake, alert, extubated HENT: Oropharynx clear, pupils equal Lungs: Distant, few scattered crackles, no wheeze Cardiovascular: Regular, distant, no murmur Abdomen: Soft, nondistended, positive bowel sounds Extremities: Right foot dressing in place Neuro: Awake, alert, interacts appropriately, follows commands, good cough strength   Resolved Hospital Problem list     Assessment & Plan:   Acute hypoxic respiratory failure, HAP vs aspiration  Bilateral airspace opacities, suspect ARDS. Hx of COVID-19 pneumonia, 04/05/2020, 09/24/2019, 05/21/2019 Extubated on 2/19. On [place] with good saturations. Finished course of AB for pneumonia, but will continue AB for osteo - Linezolid and ceftriaxone completed 2/19, continue antibiotics as below - Appears euvolemic on exam. Will hold lasix this morning. - BAL cultures negative encourage pulmonary hygiene - Wean supplemental O2 as possible.  - CXR shows low lung volumes and upper lung zone opacities  Type I diabetic: - On SSI q 4 hours and q HS coverage with CBGs around 187-275.  - Will start basal insulin with lantus 8 units q HS.  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 [name]) Subsequent I&D at [facility] with wound [facility] application Orthopedic surgery consulted for further evaluation and management. On broad spectrum AB. Patient will switch back to Daptomycin and Ceftriaxone for an end date of 3/16.  -Antibiotics transitioned to ceftriaxone, daptomycin on  2/19 for lower extremity osteomyelitis -Pain management  Acute on CKD (Stage 3), pre-renal Proteinuria: Oliguric:   Patient received IV lasix overnight with ~ 2.3 L out om 24 hours. She had improvement of her Cr 1.89>2.59>2.37>2.21>1.24. Will monitor closely -Continue to follow serum creatinine, improving and tolerating aggressive diuresis -Continue lasix and stop once kidney function at baseline -Renal dose medications, avoid nephrotoxins -Follow urine output, BMP  Hypervolemia, improving -Continue Lasix 80 mg daily and reassess need each day. - CXR shows concern for possible pulm edema. - Appears euvolemic on exam.   Protein caloric malnutrition: Generalized weakness: -Carb modified diet  Best practice (evaluated daily)  Diet: Carb modified diet Pain/Anxiety/Delirium protocol (if indicated): Fentanyl as needed VAP protocol (if indicated): indicated DVT prophylaxis: heparin subcutaneous GI prophylaxis: ppi Glucose control: ssi Mobility: PT/OT Disposition: icu  Goals of Care:  Last date of multidisciplinary goals of care discussion: n/a Family and staff present: patient's partner was at bed side. Summary of discussion: discuss current treatment plan Follow up goals of care discussion due: end of the week  Code Status: full   Labs   CBC: Recent Labs  Lab 05/17/20 0915 05/17/20 1233 05/18/20 0630 05/19/20 0405 05/20/20 0338 05/21/20 0449  WBC 6.6  --  7.0 7.4 7.6 9.6  HGB 7.6* 7.5* 7.3* 8.1* 8.3* 8.3*  HCT 24.5* 22.0* 24.1* 25.2* 25.8* 26.9*  MCV 92.1  --  93.8 92.0 90.8 91.5  PLT 196  --  180 203 221 756    Basic Metabolic Panel: Recent Labs  Lab 05/16/20 1131  05/16/20 1509 05/17/20 0500 05/17/20 1233 05/17/20 1753 05/18/20 0614 05/19/20 0240 05/19/20 1030 05/20/20 0338 05/21/20 0449  NA  --   --  135 137  --  138  --  140 137 132*  K  --   --  3.3* 4.3  --  3.5  --  3.9 4.1 4.2  CL  --   --  99  --   --  105  --  104 98 95*  CO2  --   --  23   --   --  25  --  _0 GLUCOSE  --   --  200*  --   --  107*  --  162* 193* 275*  BUN  --   --  41*  --   --  43*  --  43* 37* 28*  CREATININE  --   --  2.21*  --   --  1.86* 1.75* 1.48* 1.27* 1.24*  CALCIUM  --   --  7.6*  --   --  7.8*  --  8.0* 8.3* 8.3*  MG 1.8 1.7 1.8  --  2.1 2.1  --  2.0 1.8 1.8  PHOS 5.0* 4.3 4.6  --  4.1  --   --   --  3.5  --    GFR: Estimated Creatinine Clearance: 72.6 mL/min (A) (by C-G formula based on SCr of 1.24 mg/dL (H)). Recent Labs  Lab 05/18/20 0630 05/18/20 1532 05/19/20 0240 05/19/20 0405 05/20/20 0338 05/21/20 0449  PROCALCITON  --  5.67 4.45  --  2.35  --   WBC 7.0  --   --  7.4 7.6 9.6    Liver Function Tests: Recent Labs  Lab 05/16/20 0624  AST 37  ALT 44  ALKPHOS 129*  BILITOT 1.7*  PROT 6.0*  ALBUMIN 1.1*   No results for input(s): LIPASE, AMYLASE in the last 168 hours. No results for input(s): AMMONIA in the last 168 hours.  ABG    Component Value Date/Time   PHART 7.374 05/17/2020 1233   PCO2ART 46.3 05/17/2020 1233   PO2ART 92 05/17/2020 1233   HCO3 26.9 05/17/2020 1233   TCO2 28 05/17/2020 1233   ACIDBASEDEF 1.9 05/13/2020 0825   O2SAT 97.0 05/17/2020 1233     Coagulation Profile: No results for input(s): INR, PROTIME in the last 168 hours.  Cardiac Enzymes: Recent Labs  Lab 05/20/20 0338  CKTOTAL 22*    HbA1C: Hemoglobin A1C  Date/Time Value Ref Range Status  02/29/2020 03:58 PM 9.0 (A) 4.0 - 5.6 % Final   Hgb A1c MFr Bld  Date/Time Value Ref Range Status  04/10/2020 11:22 AM 8.6 (H) 4.8 - 5.6 % Final    Comment:    (NOTE) Pre diabetes:          5.7%-6.4%  Diabetes:              >6.4%  Glycemic control for   <7.0% adults with diabetes   09/25/2019 04:38 AM 8.3 (H) 4.8 - 5.6 % Final    Comment:    (NOTE) Pre diabetes:          5.7%-6.4%  Diabetes:              >6.4%  Glycemic control for   <7.0% adults with diabetes     CBG: Recent Labs  Lab 05/20/20 0322 05/20/20 0712  05/20/20 1131 05/20/20 1541 05/20/20 2124  GLUCAP 188* 159* 216* 162* 187*  Critical care time:     Lawerance Cruel, D.O.  Internal Medicine Resident, PGY-2 [facility]  Pager: [phone] 7:23 AM, 05/21/2020

## 2020-05-21 NOTE — Progress Notes (Signed)
Physical Therapy Treatment Patient Details Name: [name] MRN: [id number] DOB: 21-Aug-1990 Today's Date: 05/21/2020    History of Present Illness 30 yo female admitted 2/12 after found unresponsive at home by spouse. Pt intubated 2/13-2/15, reintubated 2/16, extubated 2/19. PMHx: Pt with recent admit to [facility] 1/28-2/9 for I&D and VAc of right foot. [facility] admit 1/10-1/17 with osteomyelitis and Rt ray amputation by Dr.Duda 1/14. DM, Covid 3x, HTN, CKD, anemia, anxiety    PT Comments    Pt alert, pleasant, talkative and able to progress OOB to chair today. Pt with weakness limiting ability to stand from lower surfaces and limited by fatigue with pivot to chair only. Pt stood x 2 trials and needs continued mobility to return to PLOF. Encouraged OOB with nursing throughout the day as well as continued HEP.   SPO2 95% on 3L HR 99 BP 172/99 in chair    Follow Up Recommendations  Home health PT     Equipment Recommendations  3in1 (PT)    Recommendations for Other Services       Precautions / Restrictions Precautions Precautions: Other (comment);Fall Precaution Comments: sacral wound, Rt prevalon; s/p R ray amputation Restrictions RLE Weight Bearing: Non weight bearing    Mobility  Bed Mobility Overal bed mobility: Needs Assistance Bed Mobility: Supine to Sit     Supine to sit: Min guard;HOB elevated     General bed mobility comments: HOB 30 degrees with cues for sequence and assist to manage lines, pt able to pivot to left side of bed without physical assist    Transfers Overall transfer level: Needs assistance   Transfers: Sit to/from Stand;Stand Pivot Transfers Sit to Stand: Min guard;Mod assist;+2 safety/equipment Stand pivot transfers: Min assist       General transfer comment: minguard assist to stand from bed with min assist to pivot NWB on right toward chair with RW. Pt required mod +2 to stand from low surface of recliner with NWB status, cues for hand  placement and assist to rise from surface  Ambulation/Gait             General Gait Details: unable   Stairs             Wheelchair Mobility    Modified Rankin (Stroke Patients Only)       Balance Overall balance assessment: Needs assistance   Sitting balance-Leahy Scale: Good Sitting balance - Comments: EOB without UE support   Standing balance support: Bilateral upper extremity supported Standing balance-Leahy Scale: Poor Standing balance comment: bil UE support to maintain Rt NWB                            Cognition Arousal/Alertness: Awake/alert Behavior During Therapy: WFL for tasks assessed/performed Overall Cognitive Status: Within Functional Limits for tasks assessed                                        Exercises General Exercises - Lower Extremity Long Arc Quad: AROM;Both;Seated;15 reps Hip Flexion/Marching: AROM;Both;Seated;15 reps    General Comments        Pertinent Vitals/Pain Pain Score: 6  Pain Location: right foot Pain Descriptors / Indicators: Aching;Discomfort;Sore Pain Intervention(s): Limited activity within patient's tolerance;Monitored during session;Repositioned;RN gave pain meds during session    Home Living  Prior Function            PT Goals (current goals can now be found in the care plan section) Progress towards PT goals: Progressing toward goals    Frequency    Min 3X/week      PT Plan Current plan remains appropriate    Co-evaluation              AM-PAC PT "6 Clicks" Mobility   Outcome Measure  Help needed turning from your back to your side while in a flat bed without using bedrails?: A Little Help needed moving from lying on your back to sitting on the side of a flat bed without using bedrails?: A Little Help needed moving to and from a bed to a chair (including a wheelchair)?: A Little Help needed standing up from a chair using your  arms (e.g., wheelchair or bedside chair)?: A Lot Help needed to walk in hospital room?: Total Help needed climbing 3-5 steps with a railing? : Total 6 Click Score: 13    End of Session Equipment Utilized During Treatment: Gait belt Activity Tolerance: Patient tolerated treatment well Patient left: in chair;with call bell/phone within reach;with chair alarm set Nurse Communication: Mobility status;Precautions PT Visit Diagnosis: Other abnormalities of gait and mobility (R26.89);Muscle weakness (generalized) (M62.81);Difficulty in walking, not elsewhere classified (R26.2)     Time: 3716-9678 PT Time Calculation (min) (ACUTE ONLY): 28 min  Charges:  $Therapeutic Exercise: 8-22 mins $Therapeutic Activity: 8-22 mins                     Higinio Grow P, PT Acute Rehabilitation Services Pager: [phone] Office: [phone]    Joeph Szatkowski B Khadeem Rockett 05/21/2020, 10:04 AM

## 2020-05-22 DIAGNOSIS — J9621 Acute and chronic respiratory failure with hypoxia: Secondary | ICD-10-CM | POA: Diagnosis not present

## 2020-05-22 DIAGNOSIS — N179 Acute kidney failure, unspecified: Secondary | ICD-10-CM | POA: Diagnosis not present

## 2020-05-22 DIAGNOSIS — E1069 Type 1 diabetes mellitus with other specified complication: Secondary | ICD-10-CM | POA: Diagnosis not present

## 2020-05-22 DIAGNOSIS — M86271 Subacute osteomyelitis, right ankle and foot: Secondary | ICD-10-CM | POA: Diagnosis not present

## 2020-05-22 LAB — GLUCOSE, CAPILLARY
Glucose-Capillary: 259 mg/dL — ABNORMAL HIGH (ref 70–99)
Glucose-Capillary: 159 mg/dL — ABNORMAL HIGH (ref 70–99)
Glucose-Capillary: 132 mg/dL — ABNORMAL HIGH (ref 70–99)
Glucose-Capillary: 73 mg/dL (ref 70–99)
Glucose-Capillary: 182 mg/dL — ABNORMAL HIGH (ref 70–99)

## 2020-05-22 LAB — CBC
HCT: 28 % — ABNORMAL LOW (ref 36.0–46.0)
Hemoglobin: 8.9 g/dL — ABNORMAL LOW (ref 12.0–15.0)
MCH: 28.3 pg (ref 26.0–34.0)
MCHC: 31.8 g/dL (ref 30.0–36.0)
MCV: 88.9 fL (ref 80.0–100.0)
Platelets: 212 10*3/uL (ref 150–400)
RBC: 3.15 MIL/uL — ABNORMAL LOW (ref 3.87–5.11)
RDW: 13.8 % (ref 11.5–15.5)
WBC: 11.7 10*3/uL — ABNORMAL HIGH (ref 4.0–10.5)
nRBC: 0 % (ref 0.0–0.2)

## 2020-05-22 MED ORDER — SILVER SULFADIAZINE 1 % EX CREA
TOPICAL_CREAM | Freq: Every day | CUTANEOUS | Status: DC
  Filled 2020-05-22: qty 85

## 2020-05-22 MED ORDER — INSULIN GLARGINE 100 UNIT/ML ~~LOC~~ SOLN
14.0000 [IU] | Freq: Every day | SUBCUTANEOUS | Status: DC
  Administered 2020-05-23: 14 [IU] via SUBCUTANEOUS
  Filled 2020-05-22 (×3): qty 0.14

## 2020-05-22 NOTE — Progress Notes (Signed)
[facility] PROGRESS NOTE   [name] GHW:[id number] DOB: 05-12-1990 DOA: 05/12/2020  PCP: [name], MD  Brief History/Interval Summary: 30 yo female with a pPMH of T1DM, COVID 3x, HTN, CKD (Stage 3), and osteomyelitis s/p Rt ray amputation by Dr. [name] (04/13/20). Subsequently went to [facility] with pain in right foot where she underwent I&D and VAC placement and transferred to [facility]. Clinical course complicated by increased O2 demand on BIPAP resulting in intubation. CXR showed  extensive bilateral airspace disease 2/2 to aspiration vs infection.  She was admitted to the ICU.  Subsequently extubated on 2/19 after an initial failed intubation.  Transferred to the floor.  Consultants: Critical care medicine.  Orthopedics.  Infectious disease  Procedures:   Bronchoscopy  2/13 ECHOCARDIOGRAM COMPLETE 1. Left ventricular ejection fraction, by estimation, is 60 to 65%. The left ventricle has normal function. The left ventricle has no regional wall motion abnormalities. There is mild concentric left ventricular hypertrophy. Left ventricular diastolic parameters are indeterminate.   2. Right ventricular systolic function is normal. The right ventricular size is normal. There is moderately elevated pulmonary artery systolic pressure.   3. The mitral valve is normal in structure. Trivial mitral valve regurgitation.  4. Tricuspid valve regurgitation is mild to moderate.   5. The aortic valve is normal in structure. Aortic valve regurgitation is not visualized. No aortic stenosis is present.   6. The inferior vena cava is normal in size with greater than 50% respiratory variability, suggesting right atrial pressure of 3 mmHg.   Antibiotics: Anti-infectives (From admission, onward)   Start     Dose/Rate Route Frequency Ordered Stop   05/19/20 1000  DAPTOmycin (CUBICIN) 500 mg in sodium chloride 0.9 % IVPB        500 mg 220 mL/hr over 30 Minutes Intravenous Daily 05/19/20  0744 06/13/20 2359   05/17/20 1200  cefTRIAXone (ROCEPHIN) 2 g in sodium chloride 0.9 % 100 mL IVPB        2 g 200 mL/hr over 30 Minutes Intravenous Every 24 hours 05/17/20 0857 06/13/20 2359   05/16/20 1300  Ampicillin-Sulbactam (UNASYN) 3 g in sodium chloride 0.9 % 100 mL IVPB  Status:  Discontinued        3 g 200 mL/hr over 30 Minutes Intravenous Every 6 hours 05/16/20 0959 05/17/20 0857   05/14/20 1400  piperacillin-tazobactam (ZOSYN) IVPB 3.375 g  Status:  Discontinued        3.375 g 12.5 mL/hr over 240 Minutes Intravenous Every 8 hours 05/14/20 1027 05/16/20 0959   05/14/20 1100  piperacillin-tazobactam (ZOSYN) IVPB 3.375 g  Status:  Discontinued        3.375 g 100 mL/hr over 30 Minutes Intravenous Every 8 hours 05/14/20 1013 05/14/20 1027   05/13/20 1245  linezolid (ZYVOX) IVPB 600 mg  Status:  Discontinued        600 mg 300 mL/hr over 60 Minutes Intravenous Every 12 hours 05/13/20 1153 05/19/20 0724   05/12/20 2000  ciprofloxacin (CIPRO) tablet 500 mg  Status:  Discontinued        500 mg Oral 2 times daily 05/12/20 0257 05/12/20 1013   05/12/20 1330  Ampicillin-Sulbactam (UNASYN) 3 g in sodium chloride 0.9 % 100 mL IVPB  Status:  Discontinued        3 g 200 mL/hr over 30 Minutes Intravenous Every 6 hours 05/12/20 1323 05/14/20 0958   05/12/20 1200  DAPTOmycin (CUBICIN) 500 mg in sodium chloride 0.9 %  IVPB  Status:  Discontinued        500 mg 220 mL/hr over 30 Minutes Intravenous Daily 05/12/20 0257 05/13/20 1153   05/12/20 0800  ciprofloxacin (CIPRO) IVPB 400 mg        400 mg 200 mL/hr over 60 Minutes Intravenous  Once 05/12/20 0257 05/12/20 3151      Subjective/Interval History: Patient mentions that she is feeling better.  Shortness of breath is improving.  Continues to have some pain in her right foot.  Some nausea reported this morning and she had an episode of vomiting.  Denies any abdominal pain.    Assessment/Plan:  Acute respiratory failure with hypoxia/healthcare  associated pneumonia versus aspiration pneumonia concern for ARDS Patient was intubated in the ICU.  Seen by critical care medicine.  She was given furosemide.  She will maintain on antibiotics.  She was stabilized and subsequently extubated.  Seems to be doing well from a respiratory standpoint.  Still remains on oxygen.  Continue to wean down.  Mobilize.  Continue antibiotics as recommended by infectious disease.  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  No plans for surgical intervention.  ID was also following.  Plan is to continue daptomycin and ceftriaxone until 3/16.  Outpatient follow-up with Dr. [name].  Patient has a PICC line in the right upper arm.  Nausea and vomiting Patient had nausea and vomiting this morning.  Abdomen is benign.  Treat symptomatically for now.  If she has recurrent symptoms then may have to do imaging studies.  Diabetes mellitus type 1 On Lantus insulin and SSI.  CBGs noted to be poorly controlled.  HbA1c 8.6 in January.  Adjust dose of Lantus.  Acute on chronic kidney disease stage IIIa Renal function has improved and seems to be close to baseline now.  Monitor urine output.  Recheck labs tomorrow.  Avoid nephrotoxic agents.   Hypervolemia Was given furosemide in the ICU.  Seems to have diuresed well.  Reassess volume status tomorrow.  Check labs tomorrow.  COVID-19 She was positive last year.  The PCR testing was negative on 2/12.  However PCR was positive on 1/6.  Due to unusual situation with multiple positive PCR patient underwent IgG testing which was positive on 2/14.  Her current presentation was thought to be secondary to bacterial process due to significantly elevated procalcitonin.  Normocytic anemia Hemoglobin noted to be stable.  No evidence of overt bleeding.  Moderate malnutrition Nutrition Problem: Moderate Malnutrition Etiology: social /  environmental circumstances (difficulty chewing due to poor dentition)  Signs/Symptoms: mild fat depletion,mild muscle depletion  Interventions: Glucerna shake,Ensure Enlive (each supplement provides 350kcal and 20 grams of protein)   DVT Prophylaxis: Subcutaneous heparin Code Status: Full code Family Communication: Discussed with the patient Disposition Plan: Hopefully return home in 1 to 2 days.  Status is: Inpatient  Remains inpatient appropriate because:IV treatments appropriate due to intensity of illness or inability to take PO and Inpatient level of care appropriate due to severity of illness   Dispo: The patient is from: Home              Anticipated d/c is to: Home              Anticipated d/c date is: 2 days              Patient currently is not medically stable to d/c.   Difficult to place patient No  Medications:  Scheduled: . carvedilol  12.5 mg Oral BID WC  . Chlorhexidine Gluconate Cloth  6 each Topical Daily  . feeding supplement  1 Container Oral BID BM  . gabapentin  300 mg Oral QHS  . heparin  5,000 Units Subcutaneous Q8H  . insulin aspart  0-15 Units Subcutaneous TID WC  . insulin aspart  0-5 Units Subcutaneous QHS  . insulin glargine  8 Units Subcutaneous QHS  . ipratropium-albuterol  3 mL Nebulization BID  . mouth rinse  15 mL Mouth Rinse BID  . multivitamin with minerals  1 tablet Oral Daily  . oxyCODONE  5 mg Oral Q6H  . pantoprazole  40 mg Oral Daily  . sodium chloride flush  10-40 mL Intracatheter Q12H   Continuous: . cefTRIAXone (ROCEPHIN)  IV 200 mL/hr at 05/21/20 1200  . DAPTOmycin (CUBICIN)  IV 500 mg (05/22/20 0048)   ZOX:[id number], albuterol, docusate, ondansetron **OR** ondansetron (ZOFRAN) IV, polyethylene glycol, sodium chloride flush   Objective:  Vital Signs  Vitals:   05/21/20 2043 05/21/20 2250 05/22/20 0334 05/22/20 0854  BP:  (!) 119/58 128/65   Pulse:  87 83   Resp:   20   Temp:  98.6 F (37 C) 98.5 F  (36.9 C)   TempSrc:  Oral Oral   SpO2: 99% 93% 92% 98%  Weight:      Height:        Intake/Output Summary (Last 24 hours) at 05/22/2020 1039 Last data filed at 05/22/2020 0300 Gross per 24 hour  Intake 186.01 ml  Output 200 ml  Net -13.99 ml   Filed Weights   05/17/20 0334 05/18/20 0035 05/19/20 0426  Weight: 83.5 kg 83.6 kg 80.9 kg    General appearance: Awake alert.  In no distress Resp: Diminished air entry at the bases.  No wheezing or rhonchi.  Few crackles. Cardio: S1-S2 is normal regular.  No S3-S4.  No rubs murmurs or bruit GI: Abdomen is soft.  Nontender nondistended.  Bowel sounds are present normal.  No masses organomegaly Extremities: Right foot covered in dressing. Neurologic: Alert and oriented x3.  No focal neurological deficits.    Lab Results:  Data Reviewed: I have personally reviewed following labs and imaging studies  CBC: Recent Labs  Lab 05/18/20 0630 05/19/20 0405 05/20/20 0338 05/21/20 0449 05/22/20 0500  WBC 7.0 7.4 7.6 9.6 11.7*  HGB 7.3* 8.1* 8.3* 8.3* 8.9*  HCT 24.1* 25.2* 25.8* 26.9* 28.0*  MCV 93.8 92.0 90.8 91.5 88.9  PLT 180 203 221 191 147    Basic Metabolic Panel: Recent Labs  Lab 05/16/20 1131 05/16/20 1509 05/17/20 0500 05/17/20 1233 05/17/20 1753 05/18/20 0614 05/19/20 0240 05/19/20 1030 05/20/20 0338 05/21/20 0449  NA  --   --  135 137  --  138  --  140 137 132*  K  --   --  3.3* 4.3  --  3.5  --  3.9 4.1 4.2  CL  --   --  99  --   --  105  --  104 98 95*  CO2  --   --  23  --   --  25  --  _0 GLUCOSE  --   --  200*  --   --  107*  --  162* 193* 275*  BUN  --   --  41*  --   --  43*  --  43* 37* 28*  CREATININE  --   --  2.21*  --   --  1.86* 1.75* 1.48* 1.27* 1.24*  CALCIUM  --   --  7.6*  --   --  7.8*  --  8.0* 8.3* 8.3*  MG 1.8 1.7 1.8  --  2.1 2.1  --  2.0 1.8 1.8  PHOS 5.0* 4.3 4.6  --  4.1  --   --   --  3.5  --     GFR: Estimated Creatinine Clearance: 72.6 mL/min (A) (by C-G formula based on  SCr of 1.24 mg/dL (H)).  Liver Function Tests: Recent Labs  Lab 05/16/20 0624  AST 37  ALT 44  ALKPHOS 129*  BILITOT 1.7*  PROT 6.0*  ALBUMIN 1.1*    Cardiac Enzymes: Recent Labs  Lab 05/20/20 0338  CKTOTAL 22*    CBG: Recent Labs  Lab 05/21/20 0757 05/21/20 1057 05/21/20 1625 05/21/20 2022 05/22/20 0824  GLUCAP 259* 240* 263* 243* 259*     Recent Results (from the past 240 hour(s))  C Difficile Quick Screen w PCR reflex     Status: None   Collection Time: 05/13/20  6:27 AM   Specimen: STOOL  Result Value Ref Range Status   C Diff antigen NEGATIVE NEGATIVE Final   C Diff toxin NEGATIVE NEGATIVE Final   C Diff interpretation No C. difficile detected.  Final    Comment: Performed at [facility], [place] [address], [place], [place] [address]  Respiratory (~20 pathogens) panel by PCR     Status: None   Collection Time: 05/13/20  3:45 PM   Specimen: Nasopharyngeal Swab; Respiratory  Result Value Ref Range Status   Adenovirus NOT DETECTED NOT DETECTED Final   Coronavirus 229E NOT DETECTED NOT DETECTED Final    Comment: (NOTE) The Coronavirus on the Respiratory Panel, DOES NOT test for the novel  Coronavirus (2019 nCoV)    Coronavirus HKU1 NOT DETECTED NOT DETECTED Final   Coronavirus NL63 NOT DETECTED NOT DETECTED Final   Coronavirus OC43 NOT DETECTED NOT DETECTED Final   Metapneumovirus NOT DETECTED NOT DETECTED Final   Rhinovirus / Enterovirus NOT DETECTED NOT DETECTED Final   Influenza A NOT DETECTED NOT DETECTED Final   Influenza B NOT DETECTED NOT DETECTED Final   Parainfluenza Virus 1 NOT DETECTED NOT DETECTED Final   Parainfluenza Virus 2 NOT DETECTED NOT DETECTED Final   Parainfluenza Virus 3 NOT DETECTED NOT DETECTED Final   Parainfluenza Virus 4 NOT DETECTED NOT DETECTED Final   Respiratory Syncytial Virus NOT DETECTED NOT DETECTED Final   Bordetella pertussis NOT DETECTED NOT DETECTED Final   Bordetella Parapertussis NOT DETECTED NOT DETECTED  Final   Chlamydophila pneumoniae NOT DETECTED NOT DETECTED Final   Mycoplasma pneumoniae NOT DETECTED NOT DETECTED Final    Comment: Performed at [facility], [place]. [address], [place], [place] [address]  Culture, respiratory     Status: None   Collection Time: 05/13/20  6:04 PM   Specimen: Bronchoalveolar Lavage; Respiratory  Result Value Ref Range Status   Specimen Description BRONCHIAL ALVEOLAR LAVAGE  Final   Special Requests NONE  Final   Gram Stain   Final    MODERATE WBC PRESENT, PREDOMINANTLY PMN NO ORGANISMS SEEN    Culture   Final    NO GROWTH Performed at [facility], [address] [place], [place] [address]    Report Status 05/16/2020 FINAL  Final  Acid Fast Smear (AFB)     Status: None   Collection Time: 05/13/20  6:04 PM   Specimen: Bronchial Alveolar Lavage  Result Value Ref Range Status   AFB Specimen Processing Concentration  Final   Acid Fast Smear Negative  Final    Comment: (NOTE) Performed At: [facility] [place], [place] [id number] Rush Farmer MD SP:[id number]    Source (AFB) BRONCHIAL ALVEOLAR LAVAGE  Final    Comment: Performed at [facility], [place] [address] [place], [place] [address]  Fungus Culture With Stain     Status: None (Preliminary result)   Collection Time: 05/13/20  6:04 PM   Specimen: Bronchial Alveolar Lavage  Result Value Ref Range Status   Fungus Stain Final report  Final    Comment: (NOTE) Performed At: [facility] [place], [place] [id number] Rush Farmer MD TD:[id number]    Fungus (Mycology) Culture PENDING  Incomplete   Fungal Source BRONCHIAL ALVEOLAR LAVAGE  Final    Comment: Performed at [facility], [place] [address] [place], [place] [address]  Fungus Culture Result     Status: None   Collection Time: 05/13/20  6:04 PM  Result Value Ref Range Status   Result 1 Comment  Final    Comment: (NOTE) KOH/Calcofluor preparation:  no fungus  observed. Performed At: [facility] [place], [place] [id number] Rush Farmer MD GT:[id number]   Culture, blood (routine x 2)     Status: None   Collection Time: 05/14/20  4:06 PM   Specimen: BLOOD  Result Value Ref Range Status   Specimen Description BLOOD LEFT ANTECUBITAL  Final   Special Requests   Final    BOTTLES DRAWN AEROBIC AND ANAEROBIC Blood Culture adequate volume   Culture   Final    NO GROWTH 5 DAYS Performed at [facility], [address] [place], [place] [address]    Report Status 05/19/2020 FINAL  Final  Culture, blood (routine x 2)     Status: None   Collection Time: 05/14/20  4:12 PM   Specimen: BLOOD LEFT FOREARM  Result Value Ref Range Status   Specimen Description BLOOD LEFT FOREARM  Final   Special Requests   Final    BOTTLES DRAWN AEROBIC ONLY Blood Culture results may not be optimal due to an inadequate volume of blood received in culture bottles   Culture   Final    NO GROWTH 5 DAYS Performed at [facility], [place] [address], [place], [place] [address]    Report Status 05/19/2020 FINAL  Final      Radiology Studies: DG Chest Port 1 View  Result Date: 05/21/2020 CLINICAL DATA:  Acute on chronic respiratory failure EXAM: PORTABLE CHEST 1 VIEW COMPARISON:  Two days ago FINDINGS: Tracheal and esophageal extubation. Pulmonary opacity which is now greater in the upper lung zones. Mild cardiomegaly. No visible effusion or pneumothorax. Right PICC with tip at the upper cavoatrial junction IMPRESSION: Extubation with similar lung volumes. Interval change to upper lung zone predominant opacification, question interval edema. Electronically Signed   By: Monte Fantasia M.D.   On: 05/21/2020 04:43       LOS: 10 days   [facility] Pager on [email or web address]  05/22/2020, 10:39 AM

## 2020-05-22 NOTE — Progress Notes (Signed)
Pt stated that she went to restroom three times. I saw her on BSC once @0630 . Pt vomited inside of bucket, so I can't tell if she actually urinated.

## 2020-05-22 NOTE — Consult Note (Signed)
WOC Nurse Consult Note: Patient receiving care in [facility] 2W15. Reason for Consult: "Patient has recent amputation of Rt great toe, with debridement. Per ortho note continue daily dressing changes, but I do not see any dressing change orders or notes" The following order was placed 05/13/20 "Apply aqaucell dressing plus abd pad x 2 with kerlex Qshift" under the Care order/instruction area.  This information was communicated to the primary RN, Raelene Bott. Val Riles, RN, MSN, CWOCN, CNS-BC, pager [phone]

## 2020-05-22 NOTE — Progress Notes (Signed)
Pharmacy Antibiotic Note  [name] is a 30 y.o. female admitted on 05/12/2020 with altered mental status and subsequent respiratory distress concerning for pneumonia who was changed from Daptomycin and Ceftriaxone regimen for recurrent osteomyelitis to Linezolid and Ceftriaxone to treat pneumonia.  Pharmacy has been consulted to change back to Daptomycin dosing. Plan of care is to treat with Daptomycin and Ceftriaxone through original date of 3/16.   SCr is down to 1.24, CrCl~70 ml/min. CK 22 (2/20). Daptomycin dose remains appropriate.   Plan: Continue Daptomycin 500 mg IV daily.  CK weekly while on therapy.  Monitor renal function and clinical status.   Height: _0  (170.2 cm) Weight: 80.9 kg (178 lb 5.6 oz) IBW/kg (Calculated) : 61.6  Temp (24hrs), Avg:98.6 F (37 C), Min:98 F (36.7 C), Max:99.2 F (37.3 C)  Recent Labs  Lab 05/18/20 0614 05/18/20 0630 05/19/20 0240 05/19/20 0405 05/19/20 1030 05/20/20 0338 05/21/20 0449 05/22/20 0500  WBC  --  7.0  --  7.4  --  7.6 9.6 11.7*  CREATININE 1.86*  --  1.75*  --  1.48* 1.27* 1.24*  --     Estimated Creatinine Clearance: 72.6 mL/min (A) (by C-G formula based on SCr of 1.24 mg/dL (H)).    Allergies  Allergen Reactions  . Other Anaphylaxis    Reaction to [place] nuts   . Lactose Intolerance (Gi) Diarrhea    Antimicrobials this admission: Daptomycin (on PTA)>>2/13; 2/19 >> Cipro (on PTA)>> 2/12 Unasyn 2/12>>2/14, 2/16>>2/17 Zosyn 2/14>>2/16 Linezolid 2/13>>2/18 Ceftriaxone 2/18 >>  Dose adjustments this admission:   Microbiology results: COVID PCR 2/12: negative C diff 2/12: negative Strep pneumo uAg neg 2/13 resp PCR neg 2/13 Resp cx/BAL NGF 2/14 bcx ngtd 2/16 resp cx sent  Thank you for allowing pharmacy to be a part of this patient's care.  Alycia Rossetti, PharmD, BCPS Clinical Pharmacist Clinical phone for 05/22/2020: [phone] 05/22/2020 9:03 AM   **Pharmacist phone directory can now be found  on [email or web address] ([email or web address]).  Listed under [place].

## 2020-05-23 DIAGNOSIS — R600 Localized edema: Secondary | ICD-10-CM

## 2020-05-23 DIAGNOSIS — T8131XD Disruption of external operation (surgical) wound, not elsewhere classified, subsequent encounter: Secondary | ICD-10-CM

## 2020-05-23 DIAGNOSIS — E440 Moderate protein-calorie malnutrition: Secondary | ICD-10-CM

## 2020-05-23 DIAGNOSIS — R5381 Other malaise: Secondary | ICD-10-CM

## 2020-05-23 DIAGNOSIS — E1159 Type 2 diabetes mellitus with other circulatory complications: Secondary | ICD-10-CM

## 2020-05-23 DIAGNOSIS — I152 Hypertension secondary to endocrine disorders: Secondary | ICD-10-CM

## 2020-05-23 LAB — BASIC METABOLIC PANEL
Anion gap: 8 (ref 5–15)
BUN: 24 mg/dL — ABNORMAL HIGH (ref 6–20)
CO2: 31 mmol/L (ref 22–32)
Calcium: 8.5 mg/dL — ABNORMAL LOW (ref 8.9–10.3)
Chloride: 96 mmol/L — ABNORMAL LOW (ref 98–111)
Creatinine, Ser: 1.15 mg/dL — ABNORMAL HIGH (ref 0.44–1.00)
GFR, Estimated: >60 mL/min (ref >60)
Glucose, Bld: 295 mg/dL — ABNORMAL HIGH (ref 70–99)
Potassium: 4.2 mmol/L (ref 3.5–5.1)
Sodium: 135 mmol/L (ref 135–145)

## 2020-05-23 LAB — GLUCOSE, CAPILLARY
Glucose-Capillary: 273 mg/dL — ABNORMAL HIGH (ref 70–99)
Glucose-Capillary: 88 mg/dL (ref 70–99)
Glucose-Capillary: 99 mg/dL (ref 70–99)
Glucose-Capillary: 173 mg/dL — ABNORMAL HIGH (ref 70–99)

## 2020-05-23 LAB — CBC
HCT: 26.3 % — ABNORMAL LOW (ref 36.0–46.0)
Hemoglobin: 8.6 g/dL — ABNORMAL LOW (ref 12.0–15.0)
MCH: 28.9 pg (ref 26.0–34.0)
MCHC: 32.7 g/dL (ref 30.0–36.0)
MCV: 88.3 fL (ref 80.0–100.0)
Platelets: 283 10*3/uL (ref 150–400)
RBC: 2.98 MIL/uL — ABNORMAL LOW (ref 3.87–5.11)
RDW: 14.1 % (ref 11.5–15.5)
WBC: 10.6 10*3/uL — ABNORMAL HIGH (ref 4.0–10.5)
nRBC: 0 % (ref 0.0–0.2)

## 2020-05-23 MED ORDER — INSULIN GLARGINE 100 UNIT/ML ~~LOC~~ SOLN
18.0000 [IU] | Freq: Every day | SUBCUTANEOUS | Status: DC
  Administered 2020-05-23: 18 [IU] via SUBCUTANEOUS
  Filled 2020-05-23 (×2): qty 0.18

## 2020-05-23 MED ORDER — FUROSEMIDE 40 MG PO TABS: 40.0000 mg | ORAL_TABLET | Freq: Every day | ORAL | Status: DC

## 2020-05-23 NOTE — Plan of Care (Signed)
  Problem: Education: Goal: Knowledge of General Education information will improve Description: Including pain rating scale, medication(s)/side effects and non-pharmacologic comfort measures Outcome: Progressing   Problem: Health Behavior/Discharge Planning: Goal: Ability to manage health-related needs will improve Outcome: Progressing   Problem: Clinical Measurements: Goal: Ability to maintain clinical measurements within normal limits will improve Outcome: Progressing Goal: Will remain free from infection Outcome: Progressing Goal: Diagnostic test results will improve Outcome: Progressing Goal: Respiratory complications will improve Outcome: Progressing Goal: Cardiovascular complication will be avoided Outcome: Progressing   Problem: Activity: Goal: Risk for activity intolerance will decrease Outcome: Progressing   Problem: Nutrition: Goal: Adequate nutrition will be maintained Outcome: Progressing   Problem: Coping: Goal: Level of anxiety will decrease Outcome: Progressing   Problem: Elimination: Goal: Will not experience complications related to bowel motility Outcome: Progressing Goal: Will not experience complications related to urinary retention Outcome: Progressing   Problem: Pain Managment: Goal: General experience of comfort will improve Outcome: Progressing   Problem: Safety: Goal: Ability to remain free from injury will improve Outcome: Progressing   Problem: Skin Integrity: Goal: Risk for impaired skin integrity will decrease Outcome: Progressing   Problem: Activity: Goal: Ability to tolerate increased activity will improve Outcome: Progressing   Problem: Respiratory: Goal: Ability to maintain a clear airway and adequate ventilation will improve Outcome: Progressing   Problem: Role Relationship: Goal: Method of communication will improve Outcome: Progressing   

## 2020-05-23 NOTE — Progress Notes (Signed)
Patient weaned to room air. Saturation at rest 98% on room air. Patient able to complete transfers without SOB or other signs of distress.

## 2020-05-23 NOTE — Progress Notes (Signed)
PHARMACY CONSULT NOTE FOR:  OUTPATIENT  PARENTERAL ANTIBIOTIC THERAPY (OPAT)  Indication: Wound infection/osteo of right foot Regimen: Daptomycin 500 mg daily + Ceftriaxone 2 gm IV Q 24 hours End date: 06/13/20  IV antibiotic discharge orders are pended. To discharging provider:  please sign these orders via discharge navigator,  Select New Orders & click on the button choice - Manage This Unsigned Work.     Thank you for allowing pharmacy to be a part of this patient's care.   Sharin Mons, PharmD, BCPS, BCIDP Infectious Diseases Clinical Pharmacist Phone: [phone]  05/23/2020, 2:08 PM

## 2020-05-23 NOTE — Progress Notes (Signed)
Physical Therapy Treatment Patient Details Name: [name] MRN: [id number] DOB: November 06, 1990 Today's Date: 05/23/2020    History of Present Illness 30 yo female admitted 2/12 after found unresponsive at home by spouse. Pt intubated 2/13-2/15, reintubated 2/16, extubated 2/19. PMHx: Pt with recent admit to [facility] 1/28-2/9 for I&D and VAc of right foot. [facility] admit 1/10-1/17 with osteomyelitis and Rt ray amputation by Dr.Duda 1/14. DM, Covid 3x, HTN, CKD, anemia, anxiety    PT Comments    Pt was seen for mobility on side of bed, then progressed to knee walker in the room.  Pt is concerned about using crutches for a stair progression as she feels unsteady and concerned about her safety.  Talked with her about one crutch and one stair rail, and was open to the idea.  Follow along with her to get up to stairs as soon as possible and will anticipate HHPT follow along on this as well.   Follow Up Recommendations  Home health PT     Equipment Recommendations  3in1 (PT)    Recommendations for Other Services       Precautions / Restrictions Precautions Precautions: Fall Precaution Comments: sacral wound and prevalon boot R foot Restrictions Weight Bearing Restrictions: Yes RLE Weight Bearing: Non weight bearing    Mobility  Bed Mobility Overal bed mobility: Needs Assistance Bed Mobility: Sit to Supine;Supine to Sit Rolling: Supervision   Supine to sit: Min guard Sit to supine: Min guard        Transfers Overall transfer level: Needs assistance Equipment used: Rolling walker (2 wheeled) (knee walker) Transfers: Sit to/from [facility] Sit to Stand: Min guard Stand pivot transfers: Min guard      Lateral/Scoot Transfers: Min guard    Ambulation/Gait Ambulation/Gait assistance: Counsellor (Feet): 25 Feet Assistive device:  (knee walker)   Gait velocity: variable, reduced   General Gait Details: worked on turning and awareness of seqeunce and  safety on the device.  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[employer] [place] (Stroke Patients Only)       Balance Overall balance assessment: Needs assistance Sitting-balance support: Feet supported Sitting balance-Leahy Scale: Good     Standing balance support: Bilateral upper extremity supported Standing balance-Leahy Scale: Poor                              Cognition Arousal/Alertness: Awake/alert Behavior During Therapy: WFL for tasks assessed/performed Overall Cognitive Status: Within Functional Limits for tasks assessed Area of Impairment: Safety/judgement                         Safety/Judgement: Decreased awareness of deficits     General Comments: will further assess; Good participation; writing on Ipad to communicate      Exercises General Exercises - Lower Extremity Long Arc Quad: Strengthening;10 reps Heel Slides: Strengthening;10 reps Hip ABduction/ADduction: Strengthening;10 reps    General Comments General comments (skin integrity, edema, etc.): pt is up to side of bed and did chair stands.  Went to [employer] and retrieved a knee walker to practice in her room.  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, art for work Progress towards PT goals: Progressing toward goals    Frequency    Min 3X/week      PT Plan Current plan remains appropriate    Co-evaluation              AM-PAC PT "6 Clicks" Mobility   Outcome Measure  Help needed turning from your back to your side while in a flat bed without using bedrails?: A Little Help needed moving from lying on your back to sitting on the side of a  flat bed without using bedrails?: A Little Help needed moving to and from a bed to a chair (including a wheelchair)?: A Little Help needed standing up from a chair using your arms (e.g., wheelchair or bedside chair)?: A Little Help needed to walk in hospital room?: A Lot Help needed climbing 3-5 steps with a railing? : Total 6 Click Score: 15    End of Session Equipment Utilized During Treatment: Gait belt Activity Tolerance: Patient tolerated treatment well Patient left: in bed;with call bell/phone within reach;with family/visitor present Nurse Communication: Mobility status;Precautions PT Visit Diagnosis: Other abnormalities of gait and mobility (R26.89);Muscle weakness (generalized) (M62.81);Difficulty in walking, not elsewhere classified (R26.2)     Time: 9201-0071 PT Time Calculation (min) (ACUTE ONLY): 38 min  Charges:  $Gait Training: 8-22 mins $Therapeutic Exercise: 8-22 mins $Therapeutic Activity: 8-22 mins                    Ramond Dial 05/23/2020, 4:55 PM Mee Hives, PT MS Acute Rehab Dept. Number: [place] [facility] [place]

## 2020-05-23 NOTE — Progress Notes (Signed)
Patient husband at bedside. Educated husband on how to change wound, and necessity of supplies. Husband was able to demonstrate dressing change with minimal guidance. Mr. Stankovic verbalized that he feels confident he can handle changing dressings at home.

## 2020-05-23 NOTE — Progress Notes (Signed)
Occupational Therapy Treatment Patient Details Name: [name] MRN: [id number] DOB: [birth year] Today's Date: 05/23/2020    History of present illness 30 yo female admitted 2/12 after found unresponsive at home by spouse. Pt intubated 2/13-2/15, reintubated 2/16, extubated 2/19. PMHx: Pt with recent admit to [facility] 1/28-2/9 for I&D and VAc of right foot. [facility] admit 1/10-1/17 with osteomyelitis and Rt ray amputation by Dr.Duda 1/14. DM, Covid 3x, HTN, CKD, anemia, anxiety   OT comments  Pt seen for BUE HEP orange theraband x10 reps each Pt seen for activities art related for sitting EOB. Pt reports that her family is very helpful. Pt's   O2 on RA 89-94% on EOB; pt left on 1L O2 as O2 staying at 90%. Pt wanting to stay at EOB and had just used restroom, but was agreeable to HEP. Pt would benefit from continued OT skilled services. OT following acutely.    Follow Up Recommendations  Home health OT;Supervision/Assistance - 24 hour    Equipment Recommendations  3 in 1 bedside commode    Recommendations for Other Services      Precautions / Restrictions Precautions Precautions: Fall Precaution Comments: sacral wound and prevalon boot R foot Restrictions Weight Bearing Restrictions: Yes RLE Weight Bearing: Non weight bearing       Mobility Bed Mobility Overal bed mobility: Needs Assistance Bed Mobility: Sit to Supine;Supine to Sit Rolling: Supervision   Supine to sit: Min guard Sit to supine: Min guard   General bed mobility comments: sitting EOB pre and post session    Transfers Overall transfer level: Needs assistance Equipment used: Rolling walker (2 wheeled) (knee walker) Transfers: Sit to/from [facility] Sit to Stand: Min guard Stand pivot transfers: Min guard      Lateral/Scoot Transfers: Min guard General transfer comment: DNT    Balance Overall balance assessment: Needs assistance Sitting-balance support: Feet supported Sitting balance-Leahy  Scale: Good     Standing balance support: Bilateral upper extremity supported Standing balance-Leahy Scale: Poor                             ADL either performed or assessed with clinical judgement   ADL Overall ADL's : Needs assistance/impaired     Grooming: Set up;Sitting                               Functional mobility during ADLs: Min guard;Rolling walker General ADL Comments: Session's focus on BUE HEP as pt reports that her family is very helpful.     Vision   Vision Assessment?: No apparent visual deficits   Perception     Praxis      Cognition Arousal/Alertness: Awake/alert Behavior During Therapy: WFL for tasks assessed/performed Overall Cognitive Status: Within Functional Limits for tasks assessed Area of Impairment: Safety/judgement                         Safety/Judgement: Decreased awareness of deficits     General Comments: Pt reports that her spouse can assist as needed        Exercises Exercises: General Lower Extremity;Other exercises General Exercises - Lower Extremity Long Arc Quad: Strengthening;10 reps Heel Slides: Strengthening;10 reps Hip ABduction/ADduction: Strengthening;10 reps Other Exercises Other Exercises: orange theraband shoulder FF AROM; shoulder adbuction, elbow flex/ext x10 reps, 1 set   Shoulder Instructions       General Comments  O2 on RA 89-94% on EOB; pt left on 1L O2 as O2 staying at 90%.    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, art for work OT Goal Formulation: With patient/family Time For Goal Achievement:  06/01/20 Potential to Achieve Goals: Good ADL Goals Pt Will Perform Lower Body Bathing: with min assist;with adaptive equipment;sit to/from stand;with caregiver independent in assisting Pt Will Perform Lower Body Dressing: with min assist;with caregiver independent in assisting;sitting/lateral leans;sit to/from stand;with adaptive equipment Pt Will Transfer to Toilet: with min guard assist;bedside commode;squat pivot transfer Pt Will Perform Toileting - Clothing Manipulation and hygiene: with min guard assist;sitting/lateral leans Pt/caregiver will Perform Home Exercise Program: Increased strength;Both right and left upper extremity;Independently;With written HEP provided  Plan Discharge plan remains appropriate    Co-evaluation                 AM-PAC OT "6 Clicks" Daily Activity     Outcome Measure   Help from another person eating meals?: None Help from another person taking care of personal grooming?: A Little Help from another person toileting, which includes using toliet, bedpan, or urinal?: A Lot Help from another person bathing (including washing, rinsing, drying)?: A Lot Help from another person to put on and taking off regular upper body clothing?: A Lot Help from another person to put on and taking off regular lower body clothing?: A Lot 6 Click Score: 15    End of Session Equipment Utilized During Treatment: Oxygen  OT Visit Diagnosis: Unsteadiness on feet (R26.81);Other abnormalities of gait and mobility (R26.89);Muscle weakness (generalized) (M62.81);Other symptoms and signs involving cognitive function;Pain Pain - Right/Left: Right Pain - part of body: Ankle and joints of foot   Activity Tolerance Patient tolerated treatment well   Patient Left in bed;with call bell/phone within reach   Nurse Communication Mobility status        Time: 1105-1120 OT Time Calculation (min): 15 min  Charges: OT General Charges $OT Visit: 1 Visit OT  Treatments $Therapeutic Exercise: 8-22 mins  Jefferey Pica, OTR/L [place] Pager: [phone] Office: [phone]      Gibril Mastro C 05/23/2020, 5:09 PM

## 2020-05-23 NOTE — Progress Notes (Signed)
PROGRESS NOTE  [name] HQP:[id number] DOB: 03-Mar-1991   PCP: [name], MD  Patient is from: Home  DOA: 05/12/2020 LOS: 36  Chief complaints: Right foot pain  Brief Narrative / Interim history: 30 year old F with PMH of DM-1, COVID-19 infection x3, CKD-3A, HTN and osteomyelitis s/p right ray amputation by Dr. [name] on 04/13/2020 returning to [facility] ED with pain in right foot where she underwent I&D and VAC placement and transferred to [facility] for further care.  Hospital course complicated by respiratory failure presumed to be due to aspiration for which she required ICU stay.  She was extubated on 2/19 and transferred to floor.  ID recommended IV antibiotic with daptomycin and ceftriaxone through 06/13/2020.  Plan for discharge home on 05/24/2020 with home health and IV antibiotics  Subjective: Seen and examined earlier this morning.  No major events overnight of this morning.  No complaints.  Denies chest pain, dyspnea, GI or UTI symptoms.  Right foot pain fairly controlled.  Objective: Vitals:   05/23/20 0012 05/23/20 0409 05/23/20 0739 05/23/20 1537  BP: (!) 120/57 (!) 158/72  134/74  Pulse: 93 92  91  Resp: 18 16  18   Temp: 99.4 F (37.4 C) 98.9 F (37.2 C)  98.4 F (36.9 C)  TempSrc: Oral Oral    SpO2: 95% 97% 98% 95%  Weight:      Height:        Intake/Output Summary (Last 24 hours) at 05/23/2020 1816 Last data filed at 05/22/2020 2317 Gross per 24 hour  Intake 240 ml  Output --  Net 240 ml   Filed Weights   05/17/20 0334 05/18/20 0035 05/19/20 0426  Weight: 83.5 kg 83.6 kg 80.9 kg    Examination:  GENERAL: No apparent distress.  Nontoxic. HEENT: MMM.  Vision and hearing grossly intact.  NECK: Supple.  No apparent JVD.  RESP: 98% of [place].  No IWOB.  Fair aeration bilaterally. CVS:  RRR. Heart sounds normal.  ABD/GI/GU: BS+. Abd soft, NTND.  MSK/EXT:  Moves extremities. No apparent deformity.  2+ pitting edema bilaterally. SKIN: Dressing over right foot  DCI. NEURO: Awake, alert and oriented appropriately.  No apparent focal neuro deficit. PSYCH: Calm. Normal affect.  Procedures:  04/13/2020-I&D and wound VAC placement by Dr. [name] ETT 2/13-2/15, and 2/16-2/19  Microbiology summarized: COVID-19 PCR nonreactive. C. difficile negative. Full RVP panel negative. Blood cultures NGTD. Respiratory fungal culture pending. AFB smear negative.  Assessment & Plan: Acute respiratory failure with hypoxia with possible aspiration pneumonia and possible CHF: Intubated and extubated as above.  Culture data as above.  TTE with LVEF of 60 to 65%, mild concentric LVH, indeterminate DD, moderately elevated PASP.  Currently 98% on 2 L.  No respiratory complaints.  However, she has 2+ BLE edema -Wean oxygen to room air -Encourage incentive spirometry/OOB/PT/OT -Start p.o. Lasix in the morning given lower extremity edema-she needs Lasix going forward.  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   -Weekly and biweekly labs per ID. -Clarify wound care/dressing with Dr. [name] prior to discharge.   -Outpatient follow-up Dr. [name] -Home health PT/OT  Nausea and vomiting: Resolved.  Diabetic gastroparesis? -Continue Reglan  Uncontrolled DM-1 with hyperglycemia and osteomyelitis: A1c 8.6% in 03/2020. Recent Labs  Lab 05/22/20 1931 05/22/20 2239 05/23/20 0737 05/23/20 1217 05/23/20 1534  GLUCAP 73 182* 273* 88 99  -Continue current insulin  regimen  AKI on CKD-3A: AKI resolving. Recent Labs    05/13/20 0530 05/14/20 0530 05/15/20 0306 05/16/20 0624 05/17/20 0500 05/18/20 2025 05/19/20 0240 05/19/20 1030 05/20/20 0338 05/21/20 0449 05/23/20 0730  BUN 27* 28* 35* 39* 41* 43*  --  43* 37* 28* 24*  CREATININE 1.90* 1.89* 2.59* 2.37* 2.21* 1.86* 1.75* 1.48* 1.27* 1.24* 1.15*   -Monitor  COVID-19: Multiple positive PCR's at the different times.  No active infection. -No droplet or airborne precaution.  Anemia of renal disease: Stable. Recent Labs    05/15/20 0306 05/16/20 1111 05/17/20 0915 05/17/20 1233 05/18/20 0630 05/19/20 0405 05/20/20 0338 05/21/20 0449 05/22/20 0500 05/23/20 0836  HGB 8.6* 8.5* 7.6* 7.5* 7.3* 8.1* 8.3* 8.3* 8.9* 8.6*  -Monitor intermittently  Moderate malnutrition Body mass index is 27.93 kg/m. Nutrition Problem: Moderate Malnutrition Etiology: social / environmental circumstances (difficulty chewing due to poor dentition) Signs/Symptoms: mild fat depletion,mild muscle depletion Interventions: Glucerna shake,Ensure Enlive (each supplement provides 350kcal and 20 grams of protein)   DVT prophylaxis:  heparin injection 5,000 Units Start: 05/17/20 1400 Place and maintain sequential compression device Start: 05/12/20 4270  Code Status: Full code Family Communication: Patient and/or RN. Available if any question.  Level of care: Progressive Status is: Inpatient  Remains inpatient appropriate because:IV treatments appropriate due to intensity of illness or inability to take PO and Inpatient level of care appropriate due to severity of illness   Dispo: The patient is from: Home              Anticipated d/c is to: Home              Anticipated d/c date is: 1 day              Patient currently is not medically stable to d/c.   Difficult to place patient No       Consultants:  Infectious disease Orthopedic surgery   Sch Meds:  Scheduled Meds: . carvedilol  12.5 mg Oral BID WC  . Chlorhexidine Gluconate Cloth  6 each Topical Daily  . feeding supplement  1 Container Oral BID BM  . gabapentin  300 mg Oral QHS  . heparin  5,000 Units Subcutaneous Q8H  . insulin aspart  0-15 Units Subcutaneous TID WC  . insulin aspart  0-5 Units Subcutaneous QHS  . insulin glargine  18 Units Subcutaneous QHS  .  ipratropium-albuterol  3 mL Nebulization BID  . mouth rinse  15 mL Mouth Rinse BID  . multivitamin with minerals  1 tablet Oral Daily  . oxyCODONE  5 mg Oral Q6H  . pantoprazole  40 mg Oral Daily  . silver sulfADIAZINE   Topical Daily  . sodium chloride flush  10-40 mL Intracatheter Q12H   Continuous Infusions: . cefTRIAXone (ROCEPHIN)  IV 2 g (05/23/20 1306)  . DAPTOmycin (CUBICIN)  IV 500 mg (05/23/20 1649)   PRN Meds:.acetaminophen, albuterol, docusate, ondansetron **OR** ondansetron (ZOFRAN) IV, polyethylene glycol, sodium chloride flush  Antimicrobials: Anti-infectives (From admission, onward)   Start     Dose/Rate Route Frequency Ordered Stop   05/19/20 1000  DAPTOmycin (CUBICIN) 500 mg in sodium chloride 0.9 % IVPB        500 mg 220 mL/hr over 30 Minutes Intravenous Daily 05/19/20 0744 06/14/20 1559   05/17/20 1200  cefTRIAXone (ROCEPHIN) 2 g in sodium chloride 0.9 % 100 mL IVPB        2 g 200 mL/hr over 30 Minutes Intravenous Every 24 hours 05/17/20 0857 06/13/20  2359   05/16/20 1300  Ampicillin-Sulbactam (UNASYN) 3 g in sodium chloride 0.9 % 100 mL IVPB  Status:  Discontinued        3 g 200 mL/hr over 30 Minutes Intravenous Every 6 hours 05/16/20 0959 05/17/20 0857   05/14/20 1400  piperacillin-tazobactam (ZOSYN) IVPB 3.375 g  Status:  Discontinued        3.375 g 12.5 mL/hr over 240 Minutes Intravenous Every 8 hours 05/14/20 1027 05/16/20 0959   05/14/20 1100  piperacillin-tazobactam (ZOSYN) IVPB 3.375 g  Status:  Discontinued        3.375 g 100 mL/hr over 30 Minutes Intravenous Every 8 hours 05/14/20 1013 05/14/20 1027   05/13/20 1245  linezolid (ZYVOX) IVPB 600 mg  Status:  Discontinued        600 mg 300 mL/hr over 60 Minutes Intravenous Every 12 hours 05/13/20 1153 05/19/20 0724   05/12/20 2000  ciprofloxacin (CIPRO) tablet 500 mg  Status:  Discontinued        500 mg Oral 2 times daily 05/12/20 0257 05/12/20 1013   05/12/20 1330  Ampicillin-Sulbactam (UNASYN) 3 g in  sodium chloride 0.9 % 100 mL IVPB  Status:  Discontinued        3 g 200 mL/hr over 30 Minutes Intravenous Every 6 hours 05/12/20 1323 05/14/20 0958   05/12/20 1200  DAPTOmycin (CUBICIN) 500 mg in sodium chloride 0.9 % IVPB  Status:  Discontinued        500 mg 220 mL/hr over 30 Minutes Intravenous Daily 05/12/20 0257 05/13/20 1153   05/12/20 0800  ciprofloxacin (CIPRO) IVPB 400 mg        400 mg 200 mL/hr over 60 Minutes Intravenous  Once 05/12/20 0257 05/12/20 0909       I have personally reviewed the following labs and images: CBC: Recent Labs  Lab 05/19/20 0405 05/20/20 0338 05/21/20 0449 05/22/20 0500 05/23/20 0836  WBC 7.4 7.6 9.6 11.7* 10.6*  HGB 8.1* 8.3* 8.3* 8.9* 8.6*  HCT 25.2* 25.8* 26.9* 28.0* 26.3*  MCV 92.0 90.8 91.5 88.9 88.3  PLT 203 221 191 212 283   BMP &GFR Recent Labs  Lab 05/17/20 0500 05/17/20 1233 05/17/20 1753 05/18/20 0614 05/19/20 0240 05/19/20 1030 05/20/20 0338 05/21/20 0449 05/23/20 0730  NA 135   < >  --  138  --  140 137 132* 135  K 3.3*   < >  --  3.5  --  3.9 4.1 4.2 4.2  CL 99  --   --  105  --  104 98 95* 96*  CO2 23  --   --  25  --  27 27 30 31   GLUCOSE 200*  --   --  107*  --  162* 193* 275* 295*  BUN 41*  --   --  43*  --  43* 37* 28* 24*  CREATININE 2.21*  --   --  1.86* 1.75* 1.48* 1.27* 1.24* 1.15*  CALCIUM 7.6*  --   --  7.8*  --  8.0* 8.3* 8.3* 8.5*  MG 1.8  --  2.1 2.1  --  2.0 1.8 1.8  --   PHOS 4.6  --  4.1  --   --   --  3.5  --   --    < > = values in this interval not displayed.   Estimated Creatinine Clearance: 78.3 mL/min (A) (by C-G formula based on SCr of 1.15 mg/dL (H)). Liver & Pancreas: No results for input(s):  AST, ALT, ALKPHOS, BILITOT, PROT, ALBUMIN in the last 168 hours. No results for input(s): LIPASE, AMYLASE in the last 168 hours. No results for input(s): AMMONIA in the last 168 hours. Diabetic: No results for input(s): HGBA1C in the last 72 hours. Recent Labs  Lab 05/22/20 1931 05/22/20 2239  05/23/20 0737 05/23/20 1217 05/23/20 1534  GLUCAP 73 182* 273* 88 99   Cardiac Enzymes: Recent Labs  Lab 05/20/20 0338  CKTOTAL 22*   No results for input(s): PROBNP in the last 8760 hours. Coagulation Profile: No results for input(s): INR, PROTIME in the last 168 hours. Thyroid Function Tests: No results for input(s): TSH, T4TOTAL, FREET4, T3FREE, THYROIDAB in the last 72 hours. Lipid Profile: No results for input(s): CHOL, HDL, LDLCALC, TRIG, CHOLHDL, LDLDIRECT in the last 72 hours. Anemia Panel: No results for input(s): VITAMINB12, FOLATE, FERRITIN, TIBC, IRON, RETICCTPCT in the last 72 hours. Urine analysis:    Component Value Date/Time   COLORURINE YELLOW 05/12/2020 0052   APPEARANCEUR CLEAR 05/12/2020 0052   LABSPEC 1.012 05/12/2020 0052   PHURINE 5.0 05/12/2020 0052   GLUCOSEU >=500 (A) 05/12/2020 0052   HGBUR SMALL (A) 05/12/2020 0052   BILIRUBINUR NEGATIVE 05/12/2020 0052   BILIRUBINUR negative 01/02/2015 1717   KETONESUR NEGATIVE 05/12/2020 0052   PROTEINUR >=300 (A) 05/12/2020 0052   UROBILINOGEN 0.2 01/13/2015 0223   NITRITE NEGATIVE 05/12/2020 0052   LEUKOCYTESUR NEGATIVE 05/12/2020 0052   Sepsis Labs: Invalid input(s): PROCALCITONIN, [place]  Microbiology: Recent Results (from the past 240 hour(s))  Culture, blood (routine x 2)     Status: None   Collection Time: 05/14/20  4:06 PM   Specimen: BLOOD  Result Value Ref Range Status   Specimen Description BLOOD LEFT ANTECUBITAL  Final   Special Requests   Final    BOTTLES DRAWN AEROBIC AND ANAEROBIC Blood Culture adequate volume   Culture   Final    NO GROWTH 5 DAYS Performed at [facility], [address], [place], [place] [address]    Report Status 05/19/2020 FINAL  Final  Culture, blood (routine x 2)     Status: None   Collection Time: 05/14/20  4:12 PM   Specimen: BLOOD LEFT FOREARM  Result Value Ref Range Status   Specimen Description BLOOD LEFT FOREARM  Final   Special Requests    Final    BOTTLES DRAWN AEROBIC ONLY Blood Culture results may not be optimal due to an inadequate volume of blood received in culture bottles   Culture   Final    NO GROWTH 5 DAYS Performed at [facility], [place] [address], [place], [place] [address]    Report Status 05/19/2020 FINAL  Final    Radiology Studies: No results found.    Travaris Kosh T. [place]  If 7PM-7AM, please contact night-coverage [email or web address] 05/23/2020, 6:16 PM

## 2020-05-23 NOTE — TOC Initial Note (Signed)
Transition of Care [facility]) - Initial/Assessment Note    Patient Details  Name: [name] MRN: [id number] Date of Birth: Jul 31, 1990  Transition of [facility] CM/SW Contact:    Joanne Chars, LCSW Phone Number: 05/23/2020, 4:06 PM  Clinical Narrative:      CSW discussed discharge plan with pt.  Permission given to speak with husband in room.  Pt has [facility] in place. Recommendation for [facility] PT.               Expected Discharge Plan: [place] Barriers to Discharge: Continued Medical Work up   Patient Goals and CMS Choice     Choice offered to / list presented to : Patient  Expected Discharge Plan and Services Expected Discharge Plan: [place]   Discharge Planning Services: CM Consult Post Acute Care Choice: [place] arrangements for the past 2 months: Single Family Home Expected Discharge Date: 05/23/20                         HH Arranged: PT,RN [place] Agency: [facility]        Prior Living Arrangements/Services Living arrangements for the past 2 months: Single Family Home Lives with:: Spouse Patient language and need for interpreter reviewed:: Yes        Need for Family Participation in Patient Care: Yes (Comment) Care giver support system in place?: Yes (comment)      Activities of Daily Living      Permission Sought/Granted                  Emotional Assessment   Attitude/Demeanor/Rapport: Gracious   Orientation: : Oriented to Place,Oriented to Self,Oriented to  Time,Oriented to Situation Alcohol / Substance Use: Not Applicable Psych Involvement: No (comment)  Admission diagnosis:  Respiratory failure ([place]) [J96.90] GI bleed [K92.2] Disorientation [R41.0] Acute upper GI bleed [K92.2] AKI (acute kidney injury) ([place]) [N17.9] Symptomatic anemia [D64.9] Patient Active Problem List   Diagnosis Date Noted  . ARDS (adult respiratory distress syndrome) ([place])   . Malnutrition of  moderate degree 05/17/2020  . GI bleeding 05/12/2020  . AKI (acute kidney injury) ([place]) 05/12/2020  . Diarrhea 05/12/2020  . Acute upper GI bleed 05/12/2020  . Respiratory failure ([place]) 05/12/2020  . Gangrene of right foot ([place])   . Abnormal laboratory test   . Iron deficiency 04/11/2020  . Osteomyelitis ([place]) 04/10/2020  . Diabetic foot ulcer ([place]) 04/09/2020  . 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 Cannabinoid hyperemesis syndrome 04/30/2019  . Contraception management 08/06/2018  . Cyclical vomiting syndrome 06/07/2018  . Diabetic gastroparesis associated with type 1 diabetes mellitus ([place]) 06/04/2018  . Anxiety 05/31/2018  . Peripheral edema 05/31/2018  . Chronic anemia 07/10/2017  . GERD (gastroesophageal reflux disease) 06/23/2017  . Intractable nausea and vomiting 04/11/2017  . MDD (major depressive disorder), recurrent episode, mild ([place]) 02/28/2017  . Diabetes mellitus type 1 ([place]) 04/28/2013  . Severe protein-calorie malnutrition ([place]) 04/28/2013  . Hypertension associated with diabetes ([place]) 11/14/2011  . Chronic abdominal pain 09/18/2011   PCP:  [name], MD Pharmacy:   [facility] [place], [place] [address] Phone: [phone] Fax: [phone]     Social Determinants of Health (SDOH) Interventions    Readmission Risk Interventions Readmission  Risk Prevention Plan 03/22/2020 09/26/2019 07/30/2018  Transportation Screening Complete Complete Complete  PCP or Specialist Appt within 3-5 Days - - Complete  HRI or Home Care Consult - - Not Complete  HRI or Home Care Consult comments - - NA  Social Work Consult for [place] Planning/Counseling - - Not Complete  SW consult not completed comments - - NA  Palliative Care Screening - - Not Applicable  Medication Review Press photographer) Complete - (No Data)  PCP or  Specialist appointment within 3-5 days of discharge Complete Complete -  [place] or Home Care Consult Complete Complete -  SW Recovery Care/Counseling Consult Complete Complete -  Palliative Care Screening Not Applicable Not Applicable -  [place] Not Applicable Not Applicable -  Some recent data might be hidden

## 2020-05-23 NOTE — Progress Notes (Signed)
Pt is sleeping. Will wait on pain meds.

## 2020-05-23 NOTE — Progress Notes (Signed)
Spoke with Dr. Louanne [facility]) who said he would pass along dressing change concerns for post discharge to Dr. [name].

## 2020-05-24 ENCOUNTER — Other Ambulatory Visit: Payer: Self-pay | Admitting: Student

## 2020-05-24 ENCOUNTER — Telehealth: Payer: Self-pay

## 2020-05-24 DIAGNOSIS — I5031 Acute diastolic (congestive) heart failure: Secondary | ICD-10-CM

## 2020-05-24 LAB — CBC
HCT: 24.6 % — ABNORMAL LOW (ref 36.0–46.0)
Hemoglobin: 7.8 g/dL — ABNORMAL LOW (ref 12.0–15.0)
MCH: 28.7 pg (ref 26.0–34.0)
MCHC: 31.7 g/dL (ref 30.0–36.0)
MCV: 90.4 fL (ref 80.0–100.0)
Platelets: 271 10*3/uL (ref 150–400)
RBC: 2.72 MIL/uL — ABNORMAL LOW (ref 3.87–5.11)
RDW: 14.3 % (ref 11.5–15.5)
WBC: 11.9 10*3/uL — ABNORMAL HIGH (ref 4.0–10.5)
nRBC: 0 % (ref 0.0–0.2)

## 2020-05-24 LAB — PNEUMOCYSTIS JIROVECI SMEAR BY DFA: Pneumocystis jiroveci Ag: NEGATIVE

## 2020-05-24 LAB — BASIC METABOLIC PANEL WITH GFR
Anion gap: 8 (ref 5–15)
BUN: 27 mg/dL — ABNORMAL HIGH (ref 6–20)
CO2: 29 mmol/L (ref 22–32)
Calcium: 8.5 mg/dL — ABNORMAL LOW (ref 8.9–10.3)
Chloride: 99 mmol/L (ref 98–111)
Creatinine, Ser: 1.2 mg/dL — ABNORMAL HIGH (ref 0.44–1.00)
GFR, Estimated: >60 mL/min
Glucose, Bld: 133 mg/dL — ABNORMAL HIGH (ref 70–99)
Potassium: 4.6 mmol/L (ref 3.5–5.1)
Sodium: 136 mmol/L (ref 135–145)

## 2020-05-24 LAB — GLUCOSE, CAPILLARY: Glucose-Capillary: 86 mg/dL (ref 70–99)

## 2020-05-24 MED ORDER — DAPTOMYCIN IV (FOR PTA / DISCHARGE USE ONLY): 500.0000 mg | INTRAVENOUS | 0 refills | Status: DC

## 2020-05-24 MED ORDER — ACETAMINOPHEN 500 MG PO TABS: 500.0000 mg | ORAL_TABLET | Freq: Three times a day (TID) | ORAL | Status: AC

## 2020-05-24 MED ORDER — CEFTRIAXONE IV (FOR PTA / DISCHARGE USE ONLY): 2.0000 g | INTRAVENOUS | 0 refills | Status: DC

## 2020-05-24 MED ORDER — HUMULIN N KWIKPEN 100 UNIT/ML ~~LOC~~ SUPN: 10.0000 [IU] | PEN_INJECTOR | Freq: Two times a day (BID) | SUBCUTANEOUS | 1 refills | Status: DC

## 2020-05-24 MED ORDER — SILVER SULFADIAZINE 1 % EX CREA: 1.0000 "application " | TOPICAL_CREAM | Freq: Every day | CUTANEOUS | 0 refills | Status: DC

## 2020-05-24 MED ORDER — FUROSEMIDE 40 MG PO TABS: 40.0000 mg | ORAL_TABLET | Freq: Every day | ORAL | 1 refills | Status: DC

## 2020-05-24 MED FILL — FUROSEMIDE 40 MG TABLET: 40 | 30 days supply | Qty: 30 | Fill #0

## 2020-05-24 MED FILL — HUMULIN N 100 UNITS/ML KWIK: 100 | 30 days supply | Qty: 6 | Fill #0

## 2020-05-24 MED FILL — PENTIPS 32G X 4 MM MISC: 32G X 4 MM | 30 days supply | Qty: 100 | Fill #0

## 2020-05-24 NOTE — Telephone Encounter (Signed)
Appointment schedule

## 2020-05-24 NOTE — Progress Notes (Signed)
Patient husband [name] completed dressing change without requiring additional guidance, education is complete. Patient provided with prescriptions from [facility]. AVS provided to patient and reviewed. Explained to patient that IV antibiotics were given early today in anticipation for discharge and IV nurse should continue home therapy tomorrow.

## 2020-05-24 NOTE — Progress Notes (Signed)
Patient is status post fifth ray amputation with wound dehiscence.  She is discharging home today.  She was seen by Dr. [name] who felt she had improvement granulation tissue and no surgery will be necessary at this time.  I discussed with the patient she will begin daily [place] dressing changes and follow-up in our office in 1 week

## 2020-05-24 NOTE — Discharge Summary (Signed)
Physician Discharge Summary  [name] BJY:[id number] DOB: 1990/08/21 DOA: 05/12/2020  PCP: [name], MD  Admit date: 05/12/2020 Discharge date: 05/24/2020  Admitted From: Home Disposition: Home  Recommendations for Outpatient Follow-up:  1. Follow ups as below. 2. Infectious disease to arrange outpatient follow-up 3. CBC with differential and BMP weekly. CRP and ESR biweekly 4. Please follow up on the following pending results: None  Home Health: Home health PT/OT. RN for IV medication Equipment/Devices: 3 in 1 commode  Discharge Condition: Stable CODE STATUS: Full code   Follow-up Information    Kathryn Minion, MD Follow up in 1 week(s).   Specialty: Orthopedic Surgery Contact information: [name] [address] [phone]        [name], MD. Schedule an appointment as soon as possible for a visit in 1 week(s).   Specialty: Family Medicine Why: The office will contact you with your [name] discharge appointment time. Contact information: [name] [address] [phone]        [facility] Follow up.   Why: [facility] will call to schedule home visits  Contact information: [address],  [place],  [address] P: [phone]       [name]. Go on 05/25/2020.   Why: Please attend your appointment Tomorrow, Friday 05/25/20, at 9:45am.  The clinic will work with you on obtaining wound care supplies.  Contact information: [address] [place], [name] [address] P: [name], [name] - [place] Follow up.   Specialty: Home Health Services Why: [name] will call you for physical therapy appointments. Contact information: [name] [address] [phone]                [name] Course: 30 year old F with PMH of DM-1, COVID-19 infection x3, CKD-3A, HTN and osteomyelitis s/p right ray amputation by Dr.  Sharol [name] on 04/13/2020 returning to [name] ED with pain in right foot where she underwent I&D and VAC placement and transferred to [name] for further care.  [name] course complicated by respiratory failure presumed to be due to aspiration for which she required ICU stay.  She was extubated on 05/19/21 and transferred to floor. Eventually, respiratory failure resolved and she was weaned to room air.  In regards to right foot osteomyelitis, ID recommended IV antibiotic with daptomycin and ceftriaxone through 06/13/2020. Wound care instruction as below. Patient to follow-up at wound clinic as well.   Patient was also started on p.o. Lasix for bilateral lower extremity edema and possible diastolic CHF. Encouraged to elevate her legs as well.  See individual problem list below for more on  [name] course.  Discharge Diagnoses:  Acute respiratory failure with hypoxia with possible aspiration pneumonia and possible acute diastolic CHF: Intubated and extubated as above.  Culture data as above.  TTE with LVEF of 60 to 65%, mild concentric LVH, indeterminate DD, moderately elevated PASP.   No respiratory complaints. On room air. 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 Wound VAC removed. See dressing instruction below. -Continue daptomycin and ceftriaxone until 3/16 per ID.  -Weekly and biweekly labs per ID. -Outpatient follow-up at wound care, [name] and ID -Home health PT/OT and 3 in 1  commode  Nausea and vomiting: Resolved.  Diabetic gastroparesis? -Continue Reglan  Uncontrolled DM-1 with hyperglycemia and osteomyelitis: A1c 8.6% in 03/2020. Recent Labs  Lab 05/23/20 0737 05/23/20 1217 05/23/20 1534 05/23/20 1947 05/24/20 0821  GLUCAP 273* 88 99 173* 86  -Continue Humulin N 10 units twice daily.  She was using 20 units once a day  AKI on CKD-3A: AKI resolving. Recent Labs    05/14/20 0530 05/15/20 0306 05/16/20 0624 05/17/20 0500 05/18/20 0614 05/19/20 0240 05/19/20 1030 05/20/20 0338 05/21/20 0449 05/23/20 0730 05/24/20 0606  BUN 28* 35* 39* 41* 43*  --  43* 37* 28* 24* 27*  CREATININE 1.89* 2.59* 2.37* 2.21* 1.86* 1.75* 1.48* 1.27* 1.24* 1.15* 1.20*  -Recheck at follow-up  COVID-19: Multiple positive PCR's at the different times.  No active infection. -No droplet or airborne precaution.  Anemia of renal disease: Stable. Recent Labs    05/16/20 1111 05/17/20 0915 05/17/20 1233 05/18/20 0630 05/19/20 0405 05/20/20 0338 05/21/20 0449 05/22/20 0500 05/23/20 0836 05/24/20 0606  HGB 8.5* 7.6* 7.5* 7.3* 8.1* 8.3* 8.3* 8.9* 8.6* 7.8*  -Recheck CBC at follow-up  Essential hypertension: Normotensive for most part off her hydralazine and Imdur -Continue Coreg 12.5 mg twice daily -Added Lasix with BLE edema -Discontinued hydralazine and Imdur as she was normotensive for most part prior to discharge  Moderate malnutrition Body mass index is 27.93 kg/m. Nutrition Problem: Moderate Malnutrition Etiology: social / environmental circumstances (difficulty chewing due to poor dentition) Signs/Symptoms: mild fat depletion,mild muscle depletion Interventions: Glucerna shake,Ensure Enlive (each supplement provides 350kcal and 20 grams of protein)      Discharge Exam: Vitals:   05/24/20 0830 05/24/20 0928  BP: (!) 165/84   Pulse: 90   Resp:    Temp:    SpO2: 95% 93%    GENERAL: No apparent distress.  Nontoxic. HEENT: MMM.  Vision and hearing grossly intact.  NECK: Supple.  No apparent JVD.  RESP: On RA. No IWOB.  Fair aeration bilaterally. CVS:  RRR. Heart sounds normal.  ABD/GI/GU: Bowel sounds present. Soft. Non tender.  MSK/EXT:  Moves extremities. No apparent deformity. Bilateral BLE edema SKIN: Dressing over right foot DCI. NEURO: Awake, alert and  oriented appropriately.  No apparent focal neuro deficit. PSYCH: Calm. Normal affect.   Discharge Instructions  Discharge Instructions    [facility] pharmacist to adjust dose for Vancomycin, Aminoglycosides and other anti-infective therapies as requested by physician.   Complete by: As directed    [facility] to provide Cath Flo 2mg    Complete by: As directed    Administer for PICC line occlusion and as ordered by physician for other access device issues.   Anaphylaxis Kit: Provided to treat any anaphylactic reaction to the medication being provided to the patient if First Dose or when requested by physician   Complete by: As directed    Epinephrine 1mg /ml vial / amp: Administer 0.3mg  (0.62ml) subcutaneously once for moderate to severe anaphylaxis, nurse to call physician and pharmacy when reaction occurs and call 911 if needed for immediate care   Diphenhydramine 50mg /ml IV vial: Administer 25-50mg  IV/IM PRN for first dose reaction, rash, itching, mild reaction, nurse to call physician and pharmacy when reaction occurs   Sodium Chloride 0.9% NS 526ml IV: Administer if needed for hypovolemic blood pressure drop or as ordered by physician after call to physician with anaphylactic reaction   Call MD for:  difficulty breathing, headache or visual disturbances   Complete by: As directed  Call MD for:  extreme fatigue   Complete by: As directed    Call MD for:  persistant dizziness or light-headedness   Complete by: As directed    Call MD for:  redness, tenderness, or signs of infection (pain, swelling, redness, odor or green/yellow discharge around incision site)   Complete by: As directed    Call MD for:  severe uncontrolled pain   Complete by: As directed    Call MD for:  temperature >100.4   Complete by: As directed    Change dressing   Complete by: As directed    May wash foot daily with dial soap and water. Apply small amount of silvadene and dry dressing    Change dressing on IV access line weekly and PRN   Complete by: As directed    Diet - low sodium heart healthy   Complete by: As directed    Diet Carb Modified   Complete by: As directed    Discharge instructions   Complete by: As directed    It has been a pleasure taking care of you!  You were hospitalized  due to right foot pain due to right foot infection. You were treated surgically medically with improvement in your symptoms. You also developed pneumonia and respiratory failure for which were treated and improved. We are discharging you more antibiotics to complete treatment course for the foot infection. Please review your new medication list and the directions on your medications before you take them. Please follow-up with your primary care doctor in 1 to 2 weeks. Also follow-up with infectious disease and orthopedic surgeon as previously recommended to you.  We have made some adjustment to your home medications. Please review your new medication list and the directions on your medication before you take them.   Take care,   Discharge wound care:   Complete by: As directed    May wash foot daily with dial soap and water. Apply small amount of silvadene and dry dressing   Flush IV access with Sodium Chloride 0.9% and Heparin 10 units/ml or 100 units/ml   Complete by: As directed    Home infusion instructions - [facility]   Complete by: As directed    Instructions: Flush IV access with Sodium Chloride 0.9% and Heparin 10units/ml or 100units/ml   Change dressing on IV access line: Weekly and PRN   Instructions Cath Flo $Remove'2mg'LLOvmKR$ : Administer for PICC Line occlusion and as ordered by physician for other access device   [facility] pharmacist to adjust dose for: Vancomycin, Aminoglycosides and other anti-infective therapies as requested by physician   Increase activity slowly   Complete by: As directed    Method of administration may be changed at the discretion of home  infusion pharmacist based upon assessment of the patient and/or caregiver's ability to self-administer the medication ordered   Complete by: As directed      Allergies as of 05/24/2020      Reactions   Other Anaphylaxis   Reaction to [place] nuts   Lactose Intolerance (gi) Diarrhea      Medication List    STOP taking these medications   ciprofloxacin 500 MG tablet Commonly known as: CIPRO   DAPTOmycin 500 MG injection Commonly known as: CUBICIN Replaced by: daptomycin  IVPB   hydrALAZINE 50 MG tablet Commonly known as: APRESOLINE   isosorbide mononitrate 30 MG 24 hr tablet Commonly known as: IMDUR   oxyCODONE 5 MG immediate release tablet Commonly known as: Oxy  IR/ROXICODONE   oxyCODONE-acetaminophen 5-325 MG tablet Commonly known as: PERCOCET/ROXICET   promethazine 25 MG suppository Commonly known as: PHENERGAN   promethazine 25 MG tablet Commonly known as: PHENERGAN   scopolamine 1 MG/3DAYS Commonly known as: TRANSDERM-SCOP     TAKE these medications   acetaminophen 500 MG tablet Commonly known as: TYLENOL Take 1 tablet (500 mg total) by mouth every 8 (eight) hours.   aluminum-magnesium hydroxide-simethicone 884-166-06 MG/5ML Susp Commonly known as: MAALOX Take 30 mLs by mouth 3 (three) times daily as needed (indigestion).   busPIRone 5 MG tablet Commonly known as: BUSPAR Take 1 tablet (5 mg total) by mouth 3 (three) times daily.   carvedilol 12.5 MG tablet Commonly known as: COREG Take 1 tablet (12.5 mg total) by mouth 2 (two) times daily with a meal.   cefTRIAXone  IVPB Commonly known as: ROCEPHIN Inject 2 g into the vein daily for 21 days. Indication:  Wound infection/osteo right foot First Dose: Yes Last Day of Therapy:  06/13/20 Labs - Once weekly:  CBC/D and BMP, Labs - Every other week:  ESR and CRP Method of administration: IV Push Method of administration may be changed at the discretion of home infusion pharmacist based upon assessment of  the patient and/or caregiver's ability to self-administer the medication ordered.   cyclobenzaprine 10 MG tablet Commonly known as: FLEXERIL TAKE 1 TABLET(10 MG) BY MOUTH THREE TIMES DAILY AS NEEDED FOR MUSCLE SPASMS   daptomycin  IVPB Commonly known as: CUBICIN Inject 500 mg into the vein daily for 21 days. Indication:  Wound infection/osteo right foot  First Dose: Yes Last Day of Therapy:  06/13/20 Labs - Once weekly:  CBC/D, BMP, and CPK Labs - Every other week:  ESR and CRP Method of administration: IV Push Method of administration may be changed at the discretion of home infusion pharmacist based upon assessment of the patient and/or caregiver's ability to self-administer the medication ordered. Replaces: DAPTOmycin 500 MG injection   Dexcom G6 Sensor Misc USE AS DIRECTED WITH CONTINUOUS BLOOD GLUCOSE MONITOR. REPLACE EVERY 10 DAYS   Dexcom G6 Transmitter Misc USE AS DIRECTED TO  MONITOR  GLUCOSE  LEVELS   dicyclomine 20 MG tablet Commonly known as: BENTYL Take 1 tablet (20 mg total) by mouth every 8 (eight) hours as needed for spasms.   furosemide 40 MG tablet Commonly known as: Lasix Take 1 tablet (40 mg total) by mouth daily.   gabapentin 300 MG capsule Commonly known as: NEURONTIN Take 1 capsule (300 mg total) by mouth at bedtime.   HumuLIN N KwikPen 100 UNIT/ML Kiwkpen Generic drug: Insulin NPH (Human) (Isophane) Inject 10 Units into the skin 2 (two) times daily at 8 am and 10 pm. What changed:   how much to take  when to take this   loperamide 2 MG capsule Commonly known as: IMODIUM Take 6 mg by mouth daily as needed for diarrhea or loose stools.   LORazepam 0.5 MG tablet Commonly known as: ATIVAN TAKE 1 TABLET(0.5 MG) BY MOUTH TWICE DAILY AS NEEDED FOR ANXIETY What changed: See the new instructions.   metoCLOPramide 5 MG tablet Commonly known as: REGLAN Take 1 tablet (5 mg total) by mouth in the morning and at bedtime. Please keep your January appt  for further refills. Thank you   mirtazapine 30 MG tablet Commonly known as: REMERON TAKE 1 TABLET BY MOUTH AT  BEDTIME   ondansetron 4 MG disintegrating tablet Commonly known as: Zofran ODT Take 1 tablet (4 mg  total) by mouth every 8 (eight) hours as needed for nausea or vomiting.   pantoprazole 40 MG tablet Commonly known as: PROTONIX Take 1 tablet (40 mg total) by mouth 2 (two) times daily.   silver sulfADIAZINE 1 % cream Commonly known as: Silvadene Apply 1 application topically daily. Apply small amount to foot wound daily cover with dry dressing   sucralfate 1 GM/10ML suspension Commonly known as: Carafate Take 10 mLs (1 g total) by mouth every 6 (six) hours as needed.            Discharge Care Instructions  (From admission, onward)         Start     Ordered   05/24/20 0000  Change dressing on IV access line weekly and PRN  (Home infusion instructions - [facility] )        05/24/20 0816   05/24/20 0000  Discharge wound care:       Comments: May wash foot daily with dial soap and water. Apply small amount of silvadene and dry dressing   05/24/20 0816   05/24/20 0000  Change dressing       Comments: May wash foot daily with dial soap and water. Apply small amount of silvadene and dry dressing   05/24/20 0745          Consultations:  Orthopedic surgery  Infectious disease  Procedures/Studies: I&D and wound VAC placement by [name] on 04/13/2020 ETT 2/13-2/15, and 2/16-2/19   DG Abd 1 View  Result Date: 05/16/2020 CLINICAL DATA:  OG tube placement EXAM: ABDOMEN - 1 VIEW COMPARISON:  05/13/2020 FINDINGS: OG tube tip is in the proximal to mid stomach. Mild gaseous distention of the stomach. IMPRESSION: OG tube tip in the stomach. Electronically Signed   By: Rolm Baptise M.D.   On: 05/16/2020 12:27   DG Abd 1 View  Result Date: 05/13/2020 CLINICAL DATA:  Abnormal chest x-ray.  Line placement. EXAM: ABDOMEN - 1 VIEW COMPARISON:  April 11, 2020 FINDINGS: The enteric tube projects over the gastric body. The bowel gas pattern is nonspecific with a relative paucity of bowel gas in the abdomen. IMPRESSION: The enteric tube projects over the gastric body. Electronically Signed   By: Constance [name] M.D.   On: 05/13/2020 18:41   DG Chest Port 1 View  Result Date: 05/21/2020 CLINICAL DATA:  Acute on chronic respiratory failure EXAM: PORTABLE CHEST 1 VIEW COMPARISON:  Two days ago FINDINGS: Tracheal and esophageal extubation. Pulmonary opacity which is now greater in the upper lung zones. Mild cardiomegaly. No visible effusion or pneumothorax. Right PICC with tip at the upper cavoatrial junction IMPRESSION: Extubation with similar lung volumes. Interval change to upper lung zone predominant opacification, question interval edema. Electronically Signed   By: Monte Fantasia M.D.   On: 05/21/2020 04:43   DG Chest Port 1 View  Result Date: 05/19/2020 CLINICAL DATA:  30 year old female with acute respiratory distress. EXAM: PORTABLE CHEST 1 VIEW COMPARISON:  05/14/2020, 05/16/2020, 05/18/2020 FINDINGS: Unchanged position of endotracheal tube with the distal tip in the mid distal thoracic trachea. Right upper extremity PICC in place with the tip near the cavoatrial junction. The proximal side hole on the gastric decompression tube remains just superior to the gastroesophageal junction, the tip terminating off the inferior aspect of this image. Unchanged cardiomediastinal silhouette. Overall similar diffuse, peripheral lower lobe predominant hazy airspace opacities [name] differences in projection and technique. No new focal consolidation, pleural effusion, or pneumothorax. The visualized osseous  structures are within normal limits. IMPRESSION: 1. Similar appearing diffuse airspace opacifications. 2. The gastric decompression tube proximal side hole is again just superior to the gastroesophageal junction. Consider advancement by approximately 5 cm for  optimal function. Electronically Signed   By: Ruthann Cancer MD   On: 05/19/2020 11:20   DG Chest Port 1 View  Result Date: 05/18/2020 CLINICAL DATA:  Hypoxia EXAM: PORTABLE CHEST 1 VIEW COMPARISON:  May 16, 2020 FINDINGS: Endotracheal tube tip is 3.8 cm above the carina. Nasogastric tube tip is in the stomach with the side port at the gastroesophageal junction. Central catheter tip is in the superior vena cava. No pneumothorax. There is ill-defined airspace opacity throughout the lungs bilaterally, slightly less apparent compared to 2 days prior. A slight degree of consolidation is noted in the medial left base. No consolidation elsewhere heart remains upper normal in size with pulmonary vascularity within normal limits. No adenopathy. No bone lesions. IMPRESSION: Tube and catheter positions as described without pneumothorax. Note that the nasogastric tube side port is at the gastroesophageal junction. It may be prudent to consider advancing nasogastric tube 4-5 cm. Multifocal airspace opacity slightly less prominent than on previous study. Currently there is mild consolidation in the medial left base with no consolidation elsewhere. Stable cardiac silhouette. Electronically Signed   By: Lowella Grip III M.D.   On: 05/18/2020 08:10   Portable Chest x-ray  Result Date: 05/16/2020 CLINICAL DATA:  Hypoxia EXAM: PORTABLE CHEST 1 VIEW COMPARISON:  May 14, 2020 FINDINGS: Endotracheal tube tip is 2.4 cm above the carina. Nasogastric tube no longer present. Central catheter tip is in the superior vena cava near the cavoatrial junction. No pneumothorax. There is multifocal airspace opacity throughout the lungs bilaterally. There is less opacity toward the apex on the right compared to 2 days prior. Multifocal airspace opacity elsewhere appears essentially stable. Heart is upper normal in size with pulmonary vascularity normal. No adenopathy. No bone lesions. IMPRESSION: Tube and catheter positions as  described without evident pneumothorax. Multifocal airspace opacity with less opacity in the right apex compared to previous study and multifocal airspace opacity elsewhere essentially stable. No new opacity evident. Appearance consistent with multifocal pneumonia. Correlation with COVID-19 status may be advisable. Note that noncardiogenic edema and ARDS are differential considerations. More than one of these entities may present concurrently. Stable cardiac silhouette. Electronically Signed   By: Lowella Grip III M.D.   On: 05/16/2020 10:48   DG Chest Port 1 View  Result Date: 05/14/2020 CLINICAL DATA:  Abnormal respiration EXAM: PORTABLE CHEST 1 VIEW COMPARISON:  Yesterday FINDINGS: Endotracheal tube with tip between the clavicular heads and carina. Endotracheal tube that at least reaches the stomach. Right PICC with tip at the SVC. Confluent bilateral airspace disease. Lung inflation is mildly improved. Normal heart size and mediastinal contours. No visible effusion or pneumothorax. IMPRESSION: 1. Unremarkable hardware. 2. Severe airspace disease with mildly improved lung volumes. Electronically Signed   By: Monte Fantasia M.D.   On: 05/14/2020 04:48   DG Chest Port 1 View  Result Date: 05/13/2020 CLINICAL DATA:  Abnormal chest x-ray EXAM: PORTABLE CHEST 1 VIEW COMPARISON:  05/13/2020 FINDINGS: The enteric tube extends below the left hemidiaphragm. The right-sided PICC line is well position. The endotracheal tube terminates above the carina. There is no pneumothorax. Diffuse hazy bilateral airspace opacities are again noted and have slightly worsened since the prior study. IMPRESSION: 1. Slight interval worsening of the bilateral hazy airspace opacities. 2. Support lines and tubes  as above. Electronically Signed   By: Constance [name] M.D.   On: 05/13/2020 18:40   DG CHEST PORT 1 VIEW  Result Date: 05/13/2020 CLINICAL DATA:  Hypoxia. EXAM: PORTABLE CHEST 1 VIEW COMPARISON:  Chest x-ray from  yesterday. FINDINGS: Unchanged right upper extremity PICC line with the tip at the cavoatrial junction. Stable cardiomediastinal silhouette, enlarged compared to 3 weeks ago. Significantly worsened diffuse bilateral airspace disease, more confluent in the interval, with relative sparing of the bases. No pneumothorax or pleural effusion. No acute osseous abnormality. IMPRESSION: 1. Significantly worsened diffuse bilateral airspace disease may reflect pneumonia, edema, or ARDS. 2. Unchanged borderline cardiomegaly. Electronically Signed   By: Titus Dubin M.D.   On: 05/13/2020 09:11   DG Chest Portable 1 View  Result Date: 05/12/2020 CLINICAL DATA:  PICC placement. EXAM: PORTABLE CHEST 1 VIEW COMPARISON:  04/25/2020. FINDINGS: Right upper extremity PICC tip at the atrial caval junction. Increased heart size from prior exam. Patchy heterogeneous bilateral lung opacities, greatest in the suprahilar region. Possible right pleural effusion. No pneumothorax. IMPRESSION: 1. Right upper extremity PICC tip at the atrial caval junction. 2. Patchy heterogeneous bilateral lung opacities which may represent multifocal pneumonia or pulmonary edema. Increased heart size over the past 2 weeks, consider echocardiogram to assess for pericardial effusion. Electronically Signed   By: Keith [name] M.D.   On: 05/12/2020 01:17   ECHOCARDIOGRAM COMPLETE  Result Date: 05/16/2020    ECHOCARDIOGRAM REPORT   Patient Name:   TANYA CROTHERS Date of Exam: 05/16/2020 Medical Rec #:  [id number]      Height:       67.0 in Accession #:    [id number]     Weight:       177.0 lb Date of Birth:  Apr 17, 1990      BSA:          1.920 m Patient Age:    29 years       BP:           110/60 mmHg Patient Gender: F              HR:           99 bpm. Exam Location:  Inpatient Procedure: 2D Echo, Cardiac Doppler and Color Doppler STAT ECHO  Results communicated to Dr Lake [name] at 12:29pm. Indications:     [place] respiratory distress R06.03  History:          Patient has prior history of Echocardiogram examinations, most                  recent 05/13/2020. PAD; Risk Factors:Hypertension and Diabetes.                  Sepsis. Chronic kidney disease.  Sonographer:     Darlina Sicilian RDCS Referring Phys:  [name] Diagnosing Phys: Oswaldo Milian MD IMPRESSIONS  1. Left ventricular ejection fraction, by estimation, is 55 to 60%. The left ventricle has normal function. The left ventricle has no regional wall motion abnormalities. There is mild left ventricular hypertrophy. Left ventricular diastolic parameters are consistent with Grade II diastolic dysfunction (pseudonormalization). Elevated left atrial pressure.  2. Right ventricular systolic function is normal. The right ventricular size is normal. There is mildly elevated pulmonary artery systolic pressure.  3. The mitral valve is normal in structure. No evidence of mitral valve regurgitation.  4. The aortic valve is tricuspid. Aortic valve regurgitation is not visualized. No aortic stenosis is present. FINDINGS  Left Ventricle: Left ventricular ejection fraction, by estimation, is 55 to 60%. The left ventricle has normal function. The left ventricle has no regional wall motion abnormalities. The left ventricular internal cavity size was normal in size. There is  mild left ventricular hypertrophy. Left ventricular diastolic parameters are consistent with Grade II diastolic dysfunction (pseudonormalization). Elevated left atrial pressure. Right Ventricle: The right ventricular size is normal. No increase in right ventricular wall thickness. Right ventricular systolic function is normal. There is mildly elevated pulmonary artery systolic pressure. The tricuspid regurgitant velocity is 3.09  m/s, and with an assumed right atrial pressure of 8 mmHg, the estimated right ventricular systolic pressure is 91.4 mmHg. Left Atrium: Left atrial size was normal in size. Right Atrium: Right atrial size was normal in  size. Pericardium: Trivial pericardial effusion is present. Mitral Valve: The mitral valve is normal in structure. No evidence of mitral valve regurgitation. Tricuspid Valve: The tricuspid valve is normal in structure. Tricuspid valve regurgitation is trivial. Aortic Valve: The aortic valve is tricuspid. Aortic valve regurgitation is not visualized. No aortic stenosis is present. Pulmonic Valve: The pulmonic valve was grossly normal. Pulmonic valve regurgitation is not visualized. Aorta: The aortic root and ascending aorta are structurally normal, with no evidence of dilitation. IAS/Shunts: The interatrial septum was not well visualized.  LEFT VENTRICLE PLAX 2D LVIDd:         4.20 cm      Diastology LVIDs:         2.90 cm      LV e' medial:    7.51 cm/s LV PW:         1.10 cm      LV E/e' medial:  15.4 LV IVS:        1.30 cm      LV e' lateral:   8.05 cm/s LVOT diam:     1.90 cm      LV E/e' lateral: 14.4 LV SV:         60 LV SV Index:   31 LVOT Area:     2.84 cm  LV Volumes (MOD) LV vol d, MOD A2C: 110.0 ml LV vol d, MOD A4C: 123.0 ml LV vol s, MOD A2C: 55.6 ml LV vol s, MOD A4C: 59.5 ml LV SV MOD A2C:     54.4 ml LV SV MOD A4C:     123.0 ml LV SV MOD BP:      58.6 ml RIGHT VENTRICLE RV S prime:     10.10 cm/s TAPSE (M-mode): 1.9 cm LEFT ATRIUM             Index       RIGHT ATRIUM           Index LA diam:        3.90 cm 2.03 cm/m  RA Area:     11.50 cm LA Vol (A2C):   36.3 ml 18.91 ml/m RA Volume:   24.20 ml  12.60 ml/m LA Vol (A4C):   40.0 ml 20.83 ml/m LA Biplane Vol: 38.4 ml 20.00 ml/m  AORTIC VALVE LVOT Vmax:   140.00 cm/s LVOT Vmean:  111.000 cm/s LVOT VTI:    0.211 m  AORTA Ao Root diam: 2.20 cm Ao Asc diam:  2.20 cm MITRAL VALVE                TRICUSPID VALVE MV Area (PHT): 3.31 cm     TR Peak grad:   38.2 mmHg MV Decel Time: 229 msec  TR Vmax:        309.00 cm/s MV E velocity: 116.00 cm/s MV A velocity: 62.40 cm/s   SHUNTS MV E/A ratio:  1.86         Systemic VTI:  0.21 m                              Systemic Diam: 1.90 cm Epifanio Lesches MD Electronically signed by Epifanio Lesches MD Signature Date/Time: 05/16/2020/12:27:53 PM    Final (Updated)    ECHOCARDIOGRAM COMPLETE  Result Date: 05/13/2020    ECHOCARDIOGRAM REPORT   Patient Name:   QUENESHA DOUGLASS Date of Exam: 05/13/2020 Medical Rec #:  [id number]      Height:       67.0 in Accession #:    [id number]     Weight:       156.5 lb Date of Birth:  [birth year]      BSA:          1.822 m Patient Age:    29 years       BP:           145/89 mmHg Patient Gender: F              HR:           92 bpm. Exam Location:  Inpatient Procedure: 2D Echo, Cardiac Doppler and Color Doppler Indications:    Acute resporatory distress  History:        Patient has prior history of Echocardiogram examinations, most                 recent 03/17/2017. Signs/Symptoms:Shortness of Breath; Risk                 Factors:Hypertension and Diabetes. Resp. failure, sepsis, ARDS.  Sonographer:    Lavenia Atlas Referring Phys: [phone] MURALI RAMASWAMY IMPRESSIONS  1. Left ventricular ejection fraction, by estimation, is 60 to 65%. The left ventricle has normal function. The left ventricle has no regional wall motion abnormalities. There is mild concentric left ventricular hypertrophy. Left ventricular diastolic parameters are indeterminate.  2. Right ventricular systolic function is normal. The right ventricular size is normal. There is moderately elevated pulmonary artery systolic pressure.  3. The mitral valve is normal in structure. Trivial mitral valve regurgitation.  4. Tricuspid valve regurgitation is mild to moderate.  5. The aortic valve is normal in structure. Aortic valve regurgitation is not visualized. No aortic stenosis is present.  6. The inferior vena cava is normal in size with greater than 50% respiratory variability, suggesting right atrial pressure of 3 mmHg. Comparison(s): A prior study was performed on 03/17/2017. No significant change from prior study.  FINDINGS  Left Ventricle: Left ventricular ejection fraction, by estimation, is 60 to 65%. The left ventricle has normal function. The left ventricle has no regional wall motion abnormalities. The left ventricular internal cavity size was normal in size. There is  mild concentric left ventricular hypertrophy. Left ventricular diastolic parameters are indeterminate. Right Ventricle: The right ventricular size is normal. Right vetricular wall thickness was not well visualized. Right ventricular systolic function is normal. There is moderately elevated pulmonary artery systolic pressure. The tricuspid regurgitant velocity is 3.39 m/s, and with an assumed right atrial pressure of 3 mmHg, the estimated right ventricular systolic pressure is 49.0 mmHg. Left Atrium: Left atrial size was normal in size. Right Atrium: Right atrial size was normal in size. Pericardium: Trivial  pericardial effusion is present. Mitral Valve: The mitral valve is normal in structure. Trivial mitral valve regurgitation. Tricuspid Valve: The tricuspid valve is normal in structure. Tricuspid valve regurgitation is mild to moderate. Aortic Valve: The aortic valve is normal in structure. Aortic valve regurgitation is not visualized. No aortic stenosis is present. Pulmonic Valve: The pulmonic valve was normal in structure. Pulmonic valve regurgitation is not visualized. Aorta: The aortic root is normal in size and structure. Venous: The inferior vena cava is normal in size with greater than 50% respiratory variability, suggesting right atrial pressure of 3 mmHg. IAS/Shunts: The atrial septum is grossly normal.  LEFT VENTRICLE PLAX 2D LVIDd:         4.40 cm  Diastology LVIDs:         2.70 cm  LV e' medial:    9.57 cm/s LV PW:         1.10 cm  LV E/e' medial:  14.2 LV IVS:        1.20 cm  LV e' lateral:   9.90 cm/s LVOT diam:     2.00 cm  LV E/e' lateral: 13.7 LV SV:         72 LV SV Index:   39 LVOT Area:     3.14 cm  RIGHT VENTRICLE RV Basal diam:   2.50 cm RV S prime:     12.90 cm/s TAPSE (M-mode): 2.9 cm LEFT ATRIUM           Index LA diam:      4.30 cm 2.36 cm/m LA Vol (A4C): 38.2 ml 20.96 ml/m  AORTIC VALVE LVOT Vmax:   126.00 cm/s LVOT Vmean:  78.500 cm/s LVOT VTI:    0.228 m  AORTA Ao Root diam: 2.50 cm MITRAL VALVE                TRICUSPID VALVE MV Area (PHT): 5.27 cm     TR Peak grad:   46.0 mmHg MV Decel Time: 144 msec     TR Vmax:        339.00 cm/s MV E velocity: 136.00 cm/s MV A velocity: 85.30 cm/s   SHUNTS MV E/A ratio:  1.59         Systemic VTI:  0.23 m                             Systemic Diam: 2.00 cm Rudean Haskell MD Electronically signed by Rudean Haskell MD Signature Date/Time: 05/13/2020/12:41:33 PM    Final    VAS [place] LOWER EXTREMITY VENOUS (DVT)  Result Date: 05/17/2020  Lower Venous DVT Study Indications: SOB, and Pt is on a vent.  Risk Factors: None identified. Comparison Study: Previous 8/21 Negative Performing Technologist: Vonzell Schlatter RVT  Examination Guidelines: A complete evaluation includes B-mode imaging, spectral Doppler, color Doppler, and power Doppler as needed of all accessible portions of each vessel. Bilateral testing is considered an integral part of a complete examination. Limited examinations for reoccurring indications may be performed as noted. The reflux portion of the exam is performed with the patient in reverse Trendelenburg.  +---------+---------------+---------+-----------+----------+--------------+ RIGHT    CompressibilityPhasicitySpontaneityPropertiesThrombus Aging +---------+---------------+---------+-----------+----------+--------------+ CFV      Full           Yes      Yes                                 +---------+---------------+---------+-----------+----------+--------------+  SFJ      Full                                                        +---------+---------------+---------+-----------+----------+--------------+ FV Prox  Full                                                         +---------+---------------+---------+-----------+----------+--------------+ FV Mid   Full                                                        +---------+---------------+---------+-----------+----------+--------------+ FV DistalFull                                                        +---------+---------------+---------+-----------+----------+--------------+ PFV      Full                                                        +---------+---------------+---------+-----------+----------+--------------+ POP      Full           Yes      Yes                                 +---------+---------------+---------+-----------+----------+--------------+ PTV      Full                                                        +---------+---------------+---------+-----------+----------+--------------+ PERO     Full                                                        +---------+---------------+---------+-----------+----------+--------------+   +---------+---------------+---------+-----------+----------+--------------+ LEFT     CompressibilityPhasicitySpontaneityPropertiesThrombus Aging +---------+---------------+---------+-----------+----------+--------------+ CFV      Full           Yes      Yes                                 +---------+---------------+---------+-----------+----------+--------------+ SFJ      Full                                                        +---------+---------------+---------+-----------+----------+--------------+  FV Prox  Full                                                        +---------+---------------+---------+-----------+----------+--------------+ FV Mid   Full                                                        +---------+---------------+---------+-----------+----------+--------------+ FV DistalFull                                                         +---------+---------------+---------+-----------+----------+--------------+ PFV      Full                                                        +---------+---------------+---------+-----------+----------+--------------+ POP      Full           Yes      Yes                                 +---------+---------------+---------+-----------+----------+--------------+ PTV      Full                                                        +---------+---------------+---------+-----------+----------+--------------+ PERO     Full                                                        +---------+---------------+---------+-----------+----------+--------------+  Summary: BILATERAL: - No evidence of deep vein thrombosis seen in the lower extremities, bilaterally. - RIGHT: - A cystic structure is found in the popliteal fossa.   *See table(s) above for measurements and observations. Electronically signed by Monica Martinez MD on 05/17/2020 at 4:31:07 PM.    Final        The results of significant diagnostics from this hospitalization (including imaging, microbiology, ancillary and laboratory) are listed below for reference.     Microbiology: No results found for this or any previous visit (from the past 240 hour(s)).   Labs:  CBC: Recent Labs  Lab 05/20/20 0338 05/21/20 0449 05/22/20 0500 05/23/20 0836 05/24/20 0606  WBC 7.6 9.6 11.7* 10.6* 11.9*  HGB 8.3* 8.3* 8.9* 8.6* 7.8*  HCT 25.8* 26.9* 28.0* 26.3* 24.6*  MCV 90.8 91.5 88.9 88.3 90.4  PLT 221 191 212 283 271   BMP &GFR Recent Labs  Lab  0000 05/17/20 1753 05/18/20 0614 05/19/20 0240 05/19/20 1030 05/20/20 0338 05/21/20 0449 05/23/20 0730  05/24/20 0606  NA   < >  --  138  --  140 137 132* 135 136  K   < >  --  3.5  --  3.9 4.1 4.2 4.2 4.6  CL   < >  --  105  --  104 98 95* 96* 99  CO2   < >  --  25  --  $[name]'27 27 30 31 29  'Vf$ GLUCOSE   < >  --  107*  --  162* 193* 275* 295* 133*  BUN   < >  --  43*  --  43* 37*  28* 24* 27*  CREATININE  --   --  1.86*   < > 1.48* 1.27* 1.24* 1.15* 1.20*  CALCIUM   < >  --  7.8*  --  8.0* 8.3* 8.3* 8.5* 8.5*  MG  --  2.1 2.1  --  2.0 1.8 1.8  --   --   PHOS  --  4.1  --   --   --  3.5  --   --   --    < > = values in this interval not displayed.   Estimated Creatinine Clearance: 75 mL/min (A) (by C-G formula based on SCr of 1.2 mg/dL (H)). Liver & Pancreas: No results for input(s): AST, ALT, ALKPHOS, BILITOT, PROT, ALBUMIN in the last 168 hours. No results for input(s): LIPASE, AMYLASE in the last 168 hours. No results for input(s): AMMONIA in the last 168 hours. Diabetic: No results for input(s): HGBA1C in the last 72 hours. Recent Labs  Lab 05/23/20 0737 05/23/20 1217 05/23/20 1534 05/23/20 1947 05/24/20 0821  GLUCAP 273* 88 99 173* 86   Cardiac Enzymes: Recent Labs  Lab 05/20/20 0338  CKTOTAL 22*   No results for input(s): PROBNP in the last 8760 hours. Coagulation Profile: No results for input(s): INR, PROTIME in the last 168 hours. Thyroid Function Tests: No results for input(s): TSH, T4TOTAL, FREET4, T3FREE, THYROIDAB in the last 72 hours. Lipid Profile: No results for input(s): CHOL, HDL, LDLCALC, TRIG, CHOLHDL, LDLDIRECT in the last 72 hours. Anemia Panel: No results for input(s): VITAMINB12, FOLATE, FERRITIN, TIBC, IRON, RETICCTPCT in the last 72 hours. Urine analysis:    Component Value Date/Time   COLORURINE YELLOW 05/12/2020 0052   APPEARANCEUR CLEAR 05/12/2020 0052   LABSPEC 1.012 05/12/2020 0052   PHURINE 5.0 05/12/2020 0052   GLUCOSEU >=500 (A) 05/12/2020 0052   HGBUR SMALL (A) 05/12/2020 0052   BILIRUBINUR NEGATIVE 05/12/2020 0052   BILIRUBINUR negative 01/02/2015 1717   KETONESUR NEGATIVE 05/12/2020 0052   PROTEINUR >=300 (A) 05/12/2020 0052   UROBILINOGEN 0.2 01/13/2015 0223   NITRITE NEGATIVE 05/12/2020 0052   LEUKOCYTESUR NEGATIVE 05/12/2020 0052   Sepsis Labs: Invalid input(s): PROCALCITONIN, LACTICIDVEN   Time  coordinating discharge: 40 minutes  SIGNED:  Mercy Riding, MD  [facility] 05/24/2020, 5:50 PM  If 7PM-7AM, please contact night-coverage [email or web address]

## 2020-05-24 NOTE — Plan of Care (Signed)
  Problem: Education: Goal: Knowledge of General Education information will improve Description: Including pain rating scale, medication(s)/side effects and non-pharmacologic comfort measures Outcome: Progressing   Problem: Health Behavior/Discharge Planning: Goal: Ability to manage health-related needs will improve Outcome: Progressing   Problem: Clinical Measurements: Goal: Ability to maintain clinical measurements within normal limits will improve Outcome: Progressing Goal: Will remain free from infection Outcome: Progressing Goal: Diagnostic test results will improve Outcome: Progressing Goal: Respiratory complications will improve Outcome: Progressing Goal: Cardiovascular complication will be avoided Outcome: Progressing   Problem: Activity: Goal: Risk for activity intolerance will decrease Outcome: Progressing   Problem: Nutrition: Goal: Adequate nutrition will be maintained Outcome: Progressing   Problem: Coping: Goal: Level of anxiety will decrease Outcome: Progressing   Problem: Elimination: Goal: Will not experience complications related to bowel motility Outcome: Progressing Goal: Will not experience complications related to urinary retention Outcome: Progressing   Problem: Pain Managment: Goal: General experience of comfort will improve Outcome: Progressing   Problem: Safety: Goal: Ability to remain free from injury will improve Outcome: Progressing   Problem: Skin Integrity: Goal: Risk for impaired skin integrity will decrease Outcome: Progressing   Problem: Activity: Goal: Ability to tolerate increased activity will improve Outcome: Progressing   Problem: Respiratory: Goal: Ability to maintain a clear airway and adequate ventilation will improve Outcome: Progressing   Problem: Role Relationship: Goal: Method of communication will improve Outcome: Progressing

## 2020-05-24 NOTE — Telephone Encounter (Signed)
Patient is calling in wanting to schedule a hospital follow up, no open appointments until the end of month, is there a way we can use 2 virtual slots and have patient come in office?

## 2020-05-24 NOTE — TOC Transition Note (Signed)
Transition of Care [facility]) - CM/SW Discharge Note   Patient Details  Name: [name] MRN: [id number] Date of Birth: 1991-02-28  [facility]) CM/SW Contact:  Joanne Chars, LCSW Phone Number: 05/24/2020, 10:15 AM   Clinical Narrative:   Pt discharging home with both [facility] (PT[facility]OT) and [facility].  Pt husband here to transport pt home.  Pt already has 3n1 in the home, which was the only recommended DME.  CSW spoke with [facility] and they were able to schedule pt for appt tomorrow at 945.  Pt confirms she can attend this appt.  [place] reports they will work with the pt on needed wound care supplies.  CSW contacted Dr Parker/PCP office for hospital discharge appt and they reported that the pt also just called about an appt.  They are working on the next available and will reach out to the pt with the appt time.  Carolynn Sayers with [facility] needed prescriptions which are now in epic and [name] confirmed she is all set.  No other needs identified.     Final next level of care: Home w Home Health Services Barriers to Discharge: Barriers Resolved   Patient Goals and CMS Choice     Choice offered to / list presented to : Patient  Discharge Placement                       Discharge Plan and Services   Discharge Planning Services: CM Consult Post Acute Care Choice: Home Health          DME Arranged: N/A (pt has 3n1 already)         HH Arranged: RN [place] Agency: [place] (Also [facility]) Date HH Agency Contacted: 05/24/20 Time [place]: 1008 Representative spoke with at [place]: Tommi Rumps with [facility] and [name] with [facility]  Social Determinants of Health (SDOH) Interventions     Readmission Risk Interventions Readmission Risk Prevention Plan 03/22/2020 09/26/2019 07/30/2018  Transportation Screening Complete Complete Complete  PCP or Specialist Appt within  3-5 Days - - Complete  HRI or [place] - - Not Complete  HRI or Home Care Consult comments - - NA  Social Work Consult for [place] Planning/Counseling - - Not Complete  SW consult not completed comments - - NA  Palliative Care Screening - - Not Applicable  Medication Review Press photographer) Complete - (No Data)  PCP or Specialist appointment within 3-5 days of discharge Complete Complete -  [place] or Home Care Consult Complete Complete -  SW Recovery Care/Counseling Consult Complete Complete -  Palliative Care Screening Not Applicable Not Applicable -  [place] Not Applicable Not Applicable -  Some recent data might be hidden

## 2020-05-25 ENCOUNTER — Ambulatory Visit (INDEPENDENT_AMBULATORY_CARE_PROVIDER_SITE_OTHER): Payer: 59 | Admitting: Psychology

## 2020-05-25 ENCOUNTER — Telehealth: Payer: Self-pay

## 2020-05-25 DIAGNOSIS — F411 Generalized anxiety disorder: Secondary | ICD-10-CM

## 2020-05-25 DIAGNOSIS — F330 Major depressive disorder, recurrent, mild: Secondary | ICD-10-CM | POA: Diagnosis not present

## 2020-05-25 MED ORDER — OXYCODONE HCL 5 MG PO TABS: 5.0000 mg | ORAL_TABLET | ORAL | 0 refills | Status: DC | PRN

## 2020-05-25 NOTE — Addendum Note (Signed)
Addended by: [name] on: 05/25/2020 04:10 PM   Modules accepted: Orders

## 2020-05-25 NOTE — Telephone Encounter (Signed)
Rx request 

## 2020-05-25 NOTE — Telephone Encounter (Signed)
Pt had foot amputated a month ago. States her and Dr. Jerline Pain discussed pain meds. Pt is wanting a refill on her Oxycodone 5 mg.

## 2020-05-28 ENCOUNTER — Ambulatory Visit (INDEPENDENT_AMBULATORY_CARE_PROVIDER_SITE_OTHER): Payer: 59 | Admitting: Family Medicine

## 2020-05-28 ENCOUNTER — Other Ambulatory Visit: Payer: Self-pay

## 2020-05-28 ENCOUNTER — Encounter: Payer: Self-pay | Admitting: Family Medicine

## 2020-05-28 VITALS — BP 110/62 | HR 75 | Temp 98.4°F | Ht 67.0 in | Wt 186.0 lb

## 2020-05-28 DIAGNOSIS — I152 Hypertension secondary to endocrine disorders: Secondary | ICD-10-CM | POA: Diagnosis not present

## 2020-05-28 DIAGNOSIS — M86271 Subacute osteomyelitis, right ankle and foot: Secondary | ICD-10-CM

## 2020-05-28 DIAGNOSIS — F330 Major depressive disorder, recurrent, mild: Secondary | ICD-10-CM | POA: Diagnosis not present

## 2020-05-28 DIAGNOSIS — E1069 Type 1 diabetes mellitus with other specified complication: Secondary | ICD-10-CM | POA: Diagnosis not present

## 2020-05-28 DIAGNOSIS — R55 Syncope and collapse: Secondary | ICD-10-CM

## 2020-05-28 DIAGNOSIS — E1159 Type 2 diabetes mellitus with other circulatory complications: Secondary | ICD-10-CM

## 2020-05-28 MED ORDER — OXYCODONE HCL 5 MG PO TABS: 5.0000 mg | ORAL_TABLET | ORAL | 0 refills | Status: DC | PRN

## 2020-05-28 NOTE — Progress Notes (Addendum)
Chief Complaint:  [name] is a 30 y.o. female who presents today for a TCM visit.  Assessment/Plan:  New/Acute Problems: Syncope Concern for possible cardiac etiology for her syncopal episode given that it occurred without warning.  She did have low hemoglobin while in the ED however this was back to her normal level on recheck and she had no signs of GI bleed.  No signs of seizure-like activity.  She was on cardiac monitoring while in the hospital however I can only see where she had 1 isolated 5 beat run of V. tach.  She had an echocardiogram done which showed ventricular hypertrophy and grade 2 diastolic dysfunction. Will place referral to cardiology for further evaluation.  Encourage good oral hydration.  Aspiration / Respiratory Failure Resolved.   Chronic Problems Addressed Today: Diabetes mellitus type 1 ([place]) Home sugars have been recently well controlled.  Continue current medication management per endocrinology.  Last A1c 8.6.  Hypertension associated with diabetes ([place]) At goal.  Continue Coreg 12.5 mg twice daily.  MDD (major depressive disorder), recurrent episode, mild ([place]) Recently established with a new therapist.  This is going well.  Osteomyelitis ([place]) She is on IV antibiotics per ID and is having management done by orthopedics.  Will refill her oxycodone today use with dressing changes and for severe breakthrough pain.    Subjective:  HPI:  Summary of Hospital admission: Reason for admission: Wound Infection Date of admission: 05/12/2020 Date of discharge: 05/24/2020 Date of Interactive contact: N/A todays visit with within 2 Business days of discharge.  Summary of Hospital course: Patient presented to the ED on 05/12/2020 with changes in mental status.  In the ED there was concern for respiratory failure and she was intubated.  She initially had a hemoglobin of 3.7 and was given red blood cells with improvement back up in the 9 range.  It was felt this  was likely a spurious result.  Was thought she had respiratory failure secondary to aspiration.  She was in the ICU on ventilatory support for 7 days and was extubated on 05/19/2021.  He was able to be weaned to room air over the course of the next few days.  She was continued on IV antibiotics with daptomycin and ceftriaxone for her right foot osteomyelitis.  She was discharged home.  Interim history:  She has done well since being home.  Patient states that prior to EMS being called to her house the last thing that she remembers is sitting on the edge of her bed eating Jell-O.  Her husband then found her passed out on the bed with Jell-O in her mouth.  Patient states that she did not have any warning signs or symptoms prior to her losing consciousness.  No dizziness.  No sensation of warmth or flushing.  No seizure-like activity was noted.   ROS: Per HPI, otherwise a complete review of systems was negative.   PMH:  The following were reviewed and entered/updated in epic: Past Medical History:  Diagnosis Date  . Allergy   . Anemia   . Anxiety   . Blood transfusion without reported diagnosis    Dec 2018  . Cataract    right eye  . COVID-19 03/2020  . Depression   . Diabetes type 1, uncontrolled ([place]) 11/14/2011   Since age 22  . Fibromyalgia   . Gastroparesis   . GERD (gastroesophageal reflux disease)   . Hypertension   . Infection    UTI April 2016  Patient Active Problem List   Diagnosis Date Noted  . GI bleeding 05/12/2020  . Iron deficiency 04/11/2020  . Osteomyelitis ([place]) 04/10/2020  . Diabetic foot ulcer ([place]) 04/09/2020  . 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 Cannabinoid hyperemesis syndrome 04/30/2019  . Contraception management 08/06/2018  . Cyclical vomiting syndrome 06/07/2018  . Diabetic gastroparesis associated with type 1 diabetes mellitus ([place]) 06/04/2018  . Anxiety 05/31/2018  . Peripheral edema 05/31/2018  . Chronic anemia  07/10/2017  . GERD (gastroesophageal reflux disease) 06/23/2017  . Intractable nausea and vomiting 04/11/2017  . MDD (major depressive disorder), recurrent episode, mild ([place]) 02/28/2017  . Diabetes mellitus type 1 ([place]) 04/28/2013  . Severe protein-calorie malnutrition ([place]) 04/28/2013  . Hypertension associated with diabetes ([place]) 11/14/2011  . Chronic abdominal pain 09/18/2011   Past Surgical History:  Procedure Laterality Date  . AMPUTATION Right 04/13/2020   Procedure: 1ST RAY AMPUTATION RIGHT FOOT;  Surgeon: Newt Minion, MD;  Location: [facility];  Service: Orthopedics;  Laterality: Right;  . ANKLE SURGERY    . CHOLECYSTECTOMY  11/15/2011   Procedure: LAPAROSCOPIC CHOLECYSTECTOMY WITH INTRAOPERATIVE CHOLANGIOGRAM;  Surgeon: [name], MD;  [facility];  Service: General;  Laterality: N/A;  . COLONOSCOPY    . COLONOSCOPY WITH PROPOFOL N/A 06/27/2017   Procedure: COLONOSCOPY WITH PROPOFOL;  Surgeon: Milus Banister, MD;  [facility];  Service: Endoscopy;  Laterality: N/A;  . ESOPHAGOGASTRODUODENOSCOPY  12/03/2011   Procedure: ESOPHAGOGASTRODUODENOSCOPY (EGD);  Surgeon: Beryle Beams, MD;  [facility];  Service: Endoscopy;  Laterality: N/A;  . FLEXIBLE SIGMOIDOSCOPY N/A 03/10/2017   Procedure: FLEXIBLE SIGMOIDOSCOPY;  Surgeon: Carol Ada, MD;  [facility];  Service: Endoscopy;  Laterality: N/A;  . INCISION AND DRAINAGE PERIRECTAL ABSCESS N/A 03/01/2017   Procedure: IRRIGATION AND DEBRIDEMENT PERIRECTAL ABSCESS;  Surgeon: Alphonsa Overall, MD;  [facility];  Service: General;  Laterality: N/A;  . IRRIGATION AND DEBRIDEMENT BUTTOCKS N/A 03/23/2017   Procedure: IRRIGATION AND DEBRIDEMENT BUTTOCKS, SETON PLACEMENT;  Surgeon: [name], MD;  [facility];  Service: General;  Laterality: N/A;  . LAPAROSCOPY  11/23/2011   Procedure: LAPAROSCOPY DIAGNOSTIC;  Surgeon: Edward Jolly, MD;  [facility];  Service: General;  Laterality:  N/A;  . SIGMOIDOSCOPY    . UPPER GASTROINTESTINAL ENDOSCOPY    . WISDOM TOOTH EXTRACTION      Family History  Problem Relation Age of Onset  . Diabetes Mother   . Hypertension Father   . Colon cancer Paternal Grandmother        pt thinks PGM was dx in her 32's  . Diabetes Paternal Grandmother   . Diabetes Maternal Grandmother   . Diabetes Maternal Grandfather   . Diabetes Paternal Grandfather   . Diabetes Other   . Breast cancer Paternal Aunt   . Esophageal cancer Neg Hx   . Liver cancer Neg Hx   . Pancreatic cancer Neg Hx   . Stomach cancer Neg Hx   . Rectal cancer Neg Hx     Medications- Reconciled discharge and current medications in Epic.  Current Outpatient Medications  Medication Sig Dispense Refill  . acetaminophen (TYLENOL) 500 MG tablet Take 1 tablet (500 mg total) by mouth every 8 (eight) hours. 60 tablet   . aluminum-magnesium hydroxide-simethicone (MAALOX) 937-169-67 MG/5ML SUSP Take 30 mLs by mouth 3 (three) times daily as needed (indigestion).    . busPIRone (BUSPAR) 5 MG tablet Take 1 tablet (5 mg  total) by mouth 3 (three) times daily. 90 tablet 5  . carvedilol (COREG) 12.5 MG tablet Take 1 tablet (12.5 mg total) by mouth 2 (two) times daily with a meal. 60 tablet 2  . cefTRIAXone (ROCEPHIN) IVPB Inject 2 g into the vein daily for 21 days. Indication:  Wound infection/osteo right foot First Dose: Yes Last Day of Therapy:  06/13/20 Labs - Once weekly:  CBC/D and BMP, Labs - Every other week:  ESR and CRP Method of administration: IV Push Method of administration may be changed at the discretion of home infusion pharmacist based upon assessment of the patient and/or caregiver's ability to self-administer the medication ordered. 21 Units 0  . Continuous Blood Gluc Sensor (DEXCOM G6 SENSOR) MISC USE AS DIRECTED WITH CONTINUOUS BLOOD GLUCOSE MONITOR. REPLACE EVERY 10 DAYS 3 each 1  . Continuous Blood Gluc Transmit (DEXCOM G6 TRANSMITTER) MISC USE AS DIRECTED TO   MONITOR  GLUCOSE  LEVELS 1 each 0  . cyclobenzaprine (FLEXERIL) 10 MG tablet TAKE 1 TABLET(10 MG) BY MOUTH THREE TIMES DAILY AS NEEDED FOR MUSCLE SPASMS 30 tablet 0  . daptomycin (CUBICIN) IVPB Inject 500 mg into the vein daily for 21 days. Indication:  Wound infection/osteo right foot  First Dose: Yes Last Day of Therapy:  06/13/20 Labs - Once weekly:  CBC/D, BMP, and CPK Labs - Every other week:  ESR and CRP Method of administration: IV Push Method of administration may be changed at the discretion of home infusion pharmacist based upon assessment of the patient and/or caregiver's ability to self-administer the medication ordered. 21 Units 0  . dicyclomine (BENTYL) 20 MG tablet Take 1 tablet (20 mg total) by mouth every 8 (eight) hours as needed for spasms. 15 tablet 0  . furosemide (LASIX) 40 MG tablet Take 1 tablet (40 mg total) by mouth daily. 90 tablet 1  . gabapentin (NEURONTIN) 300 MG capsule Take 1 capsule (300 mg total) by mouth at bedtime. 90 capsule 3  . Insulin NPH, Human,, Isophane, (HUMULIN N KWIKPEN) 100 UNIT/ML Kiwkpen Inject 10 Units into the skin 2 (two) times daily at 8 am and 10 pm. 30 mL 1  . loperamide (IMODIUM) 2 MG capsule Take 6 mg by mouth daily as needed for diarrhea or loose stools.     Marland Kitchen LORazepam (ATIVAN) 0.5 MG tablet TAKE 1 TABLET(0.5 MG) BY MOUTH TWICE DAILY AS NEEDED FOR ANXIETY (Patient taking differently: Take 0.5 mg by mouth 2 (two) times daily as needed for anxiety.) 60 tablet 1  . metoCLOPramide (REGLAN) 5 MG tablet Take 1 tablet (5 mg total) by mouth in the morning and at bedtime. Please keep your January appt for further refills. Thank you 60 tablet 0  . mirtazapine (REMERON) 30 MG tablet TAKE 1 TABLET BY MOUTH AT  BEDTIME (Patient taking differently: Take 30 mg by mouth at bedtime.) 30 tablet 11  . ondansetron (ZOFRAN ODT) 4 MG disintegrating tablet Take 1 tablet (4 mg total) by mouth every 8 (eight) hours as needed for nausea or vomiting. 10 tablet 0  .  oxyCODONE (ROXICODONE) 5 MG immediate release tablet Take 1 tablet (5 mg total) by mouth every 4 (four) hours as needed for severe pain. 15 tablet 0  . pantoprazole (PROTONIX) 40 MG tablet Take 1 tablet (40 mg total) by mouth 2 (two) times daily. 180 tablet 2  . silver sulfADIAZINE (SILVADENE) 1 % cream Apply 1 application topically daily. Apply small amount to foot wound daily cover with dry dressing 50  g 0  . sucralfate (CARAFATE) 1 GM/10ML suspension Take 10 mLs (1 g total) by mouth every 6 (six) hours as needed. 420 mL 1   No current facility-administered medications for this visit.    Allergies-reviewed and updated Allergies  Allergen Reactions  . Other Anaphylaxis    Reaction to [place] nuts   . Lactose Intolerance ([place]) Diarrhea    Social History   Socioeconomic History  . Marital status: Married    Spouse name: sergio  . Number of children: 0  . Years of education: college  . Highest education level: Not on file  Occupational History  . Occupation: [employer] call center    Employer: [employer]  Tobacco Use  . Smoking status: Never Smoker  . Smokeless tobacco: Never Used  Vaping Use  . Vaping Use: Never used  Substance and Sexual Activity  . Alcohol use: No    Alcohol/week: 0.0 standard drinks  . Drug use: Yes    Types: Marijuana  . Sexual activity: Not Currently    Partners: Male    Birth control/protection: None  Other Topics Concern  . Not on file  Social History Narrative  . Not on file   Social Determinants of Health   Financial Resource Strain: Not on file  Food Insecurity: Food Insecurity Present  . Worried About Charity fundraiser in the Last Year: Sometimes true  . Ran Out of Food in the Last Year: Sometimes true  Transportation Needs: No Transportation Needs  . Lack of Transportation (Medical): No  . Lack of Transportation (Non-Medical): No  Physical Activity: Not on file  Stress: Not on file  Social Connections: Not on file         Objective:  Physical Exam: BP 110/62   Pulse 75   Temp 98.4 F (36.9 C) (Temporal)   Ht 5' 7"  (1.702 m)   Wt 186 lb (84.4 kg)   SpO2 93%   BMI 29.13 kg/m   Gen: NAD, resting comfortably Neuro: Grossly normal, moves all extremities Psych: Normal affect and thought content  Time Spent: 55 minutes of total time was spent on the date of the encounter performing the following actions: chart review prior to seeing the patient, obtaining history, performing a medically necessary exam, counseling on the treatment plan, placing orders, and documenting in our EHR.       Algis Greenhouse. Jerline Pain, MD 05/28/2020 4:41 PM

## 2020-05-28 NOTE — Assessment & Plan Note (Signed)
Recently established with a new therapist.  This is going well.

## 2020-05-28 NOTE — Assessment & Plan Note (Signed)
Home sugars have been recently well controlled.  Continue current medication management per endocrinology.  Last A1c 8.6.

## 2020-05-28 NOTE — Assessment & Plan Note (Signed)
At goal.  Continue Coreg 12.5 mg twice daily.

## 2020-05-28 NOTE — Patient Instructions (Signed)
It was very nice to see you today!  I will place a referral for you to see a cardiologist.  I will refill your pain medications  Take care, Dr Jerline Pain  Please try these tips to maintain a healthy lifestyle:   Eat at least 3 REAL meals and 1-2 snacks per day.  Aim for no more than 5 hours between eating.  If you eat breakfast, please do so within one hour of getting up.    Each meal should contain half fruits/vegetables, one quarter protein, and one quarter carbs (no bigger than a computer mouse)   Cut down on sweet beverages. This includes juice, soda, and sweet tea.     Drink at least 1 glass of water with each meal and aim for at least 8 glasses per day   Exercise at least 150 minutes every week.

## 2020-05-28 NOTE — Assessment & Plan Note (Signed)
She is on IV antibiotics per ID and is having management done by orthopedics.  Will refill her oxycodone today use with dressing changes and for severe breakthrough pain.

## 2020-05-31 ENCOUNTER — Encounter: Payer: Self-pay | Admitting: Orthopedic Surgery

## 2020-05-31 ENCOUNTER — Ambulatory Visit (INDEPENDENT_AMBULATORY_CARE_PROVIDER_SITE_OTHER): Payer: 59 | Admitting: Physician Assistant

## 2020-05-31 DIAGNOSIS — Z89411 Acquired absence of right great toe: Secondary | ICD-10-CM

## 2020-05-31 MED ORDER — OXYCODONE HCL 5 MG PO TABS: 5.0000 mg | ORAL_TABLET | Freq: Four times a day (QID) | ORAL | 0 refills | Status: DC | PRN

## 2020-05-31 NOTE — Progress Notes (Signed)
Office Visit Note   Patient: [name]           Date of Birth: 06-13-1990           MRN: [id number] Visit Date: 05/31/2020              Requested by: [name], MD [address] [place],  [place] [address] PCP: [name], MD  Chief Complaint  Patient presents with  . Right Foot - Routine Post Op      HPI: Patient presents in follow-up. She is 5 weeks status post first ray amputation on the right foot. She did have a subsequent debridement when she presented to another hospital. She has been struggling with wound dehiscence. She also says she is being seen at the wound center. She is also receiving IV antibiotics.  Assessment & Plan: Visit Diagnoses: No diagnosis found.  Plan: Patient would like to continue with conservative care possible. I have significant concerns that this will not heal. She will follow-up in 1 week with Dr. [name]. Reviewed the importance of good glycemic control. Should continue with Silvadene dressing changes.  Follow-Up Instructions: No follow-ups on file.   Ortho Exam  Patient is alert, oriented, no adenopathy, well-dressed, normal affect, normal respiratory effort. Examination of the foot she has about 75% fibrinous tissue 25% healthy granulation tissue. There is no swelling no foul odor. She does have some swelling in her leg but she said this is improved with compared to how it usually is. No ascending cellulitis noted today.  Imaging: No results found. No images are attached to the encounter.  Labs: Lab Results  Component Value Date   HGBA1C 8.6 (H) 04/10/2020   HGBA1C 9.0 (A) 02/29/2020   HGBA1C 8.3 (H) 09/25/2019   ESRSEDRATE 127 (H) 05/13/2020   ESRSEDRATE >140 (H) 04/09/2020   ESRSEDRATE 125 (H) 03/23/2017   CRP 18.8 (H) 05/18/2020   CRP 42.4 (H) 05/14/2020   CRP 30.1 (H) 05/13/2020   LABURIC 6.8 (H) 01/23/2015   REPTSTATUS 05/19/2020 FINAL 05/14/2020   GRAMSTAIN  05/13/2020    MODERATE WBC PRESENT, PREDOMINANTLY  PMN NO ORGANISMS SEEN    CULT  05/14/2020    NO GROWTH 5 DAYS Performed at [facility], [place] [address] [place], [place] [address]    LABORGA KLEBSIELLA PNEUMONIAE 03/01/2017     Lab Results  Component Value Date   ALBUMIN 1.1 (L) 05/16/2020   ALBUMIN 1.2 (L) 05/14/2020   ALBUMIN 1.4 (L) 05/13/2020   PREALBUMIN 17.9 (L) 06/09/2018   PREALBUMIN 21.0 02/18/2018   PREALBUMIN 18.1 03/18/2017   LABURIC 6.8 (H) 01/23/2015    Lab Results  Component Value Date   MG 1.8 05/21/2020   MG 1.8 05/20/2020   MG 2.0 05/19/2020   Lab Results  Component Value Date   VD25OH 14 (L) 07/05/2014    Lab Results  Component Value Date   PREALBUMIN 17.9 (L) 06/09/2018   PREALBUMIN 21.0 02/18/2018   PREALBUMIN 18.1 03/18/2017   CBC EXTENDED Latest Ref Rng & Units 05/24/2020 05/23/2020 05/22/2020  WBC 4.0 - 10.5 K/uL 11.9(H) 10.6(H) 11.7(H)  RBC 3.87 - 5.11 MIL/uL 2.72(L) 2.98(L) 3.15(L)  HGB 12.0 - 15.0 g/dL 7.8(L) 8.6(L) 8.9(L)  HCT 36.0 - 46.0 % 24.6(L) 26.3(L) 28.0(L)  PLT 150 - 400 K/uL 271 283 212  NEUTROABS 1.7 - 7.7 K/uL - - -  LYMPHSABS 0.7 - 4.0 K/uL - - -     There is no height or weight  on file to calculate BMI.  Orders:  No orders of the defined types were placed in this encounter.  Meds ordered this encounter  Medications  . oxyCODONE (ROXICODONE) 5 MG immediate release tablet    Sig: Take 1 tablet (5 mg total) by mouth every 6 (six) hours as needed for severe pain.    Dispense:  20 tablet    Refill:  0     Procedures: No procedures performed  Clinical Data: No additional findings.  ROS:  All other systems negative, except as noted in the HPI. Review of Systems  Objective: Vital Signs: There were no vitals taken for this visit.  Specialty Comments:  No specialty comments available.  PMFS History: Patient Active Problem List   Diagnosis Date Noted  . GI bleeding 05/12/2020  . Iron deficiency 04/11/2020  . Osteomyelitis ([place]) 04/10/2020  .  Diabetic foot ulcer ([place]) 04/09/2020  . 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 Cannabinoid hyperemesis syndrome 04/30/2019  . Contraception management 08/06/2018  . Cyclical vomiting syndrome 06/07/2018  . Diabetic gastroparesis associated with type 1 diabetes mellitus ([place]) 06/04/2018  . Anxiety 05/31/2018  . Peripheral edema 05/31/2018  . Chronic anemia 07/10/2017  . GERD (gastroesophageal reflux disease) 06/23/2017  . Intractable nausea and vomiting 04/11/2017  . MDD (major depressive disorder), recurrent episode, mild ([place]) 02/28/2017  . Diabetes mellitus type 1 ([place]) 04/28/2013  . Severe protein-calorie malnutrition ([place]) 04/28/2013  . Hypertension associated with diabetes ([place]) 11/14/2011  . Chronic abdominal pain 09/18/2011   Past Medical History:  Diagnosis Date  . Allergy   . Anemia   . Anxiety   . Blood transfusion without reported diagnosis    Dec 2018  . Cataract    right eye  . COVID-19 03/2020  . Depression   . Diabetes type 1, uncontrolled ([place]) 11/14/2011   Since age 59  . Fibromyalgia   . Gastroparesis   . GERD (gastroesophageal reflux disease)   . Hypertension   . Infection    UTI April 2016    Family History  Problem Relation Age of Onset  . Diabetes Mother   . Hypertension Father   . Colon cancer Paternal Grandmother        pt thinks PGM was dx in her 51's  . Diabetes Paternal Grandmother   . Diabetes Maternal Grandmother   . Diabetes Maternal Grandfather   . Diabetes Paternal Grandfather   . Diabetes Other   . Breast cancer Paternal Aunt   . Esophageal cancer Neg Hx   . Liver cancer Neg Hx   . Pancreatic cancer Neg Hx   . Stomach cancer Neg Hx   . Rectal cancer Neg Hx     Past Surgical History:  Procedure Laterality Date  . AMPUTATION Right 04/13/2020   Procedure: 1ST RAY AMPUTATION RIGHT FOOT;  Surgeon: Newt Minion, MD;  Location: [facility];  Service: Orthopedics;  Laterality: Right;  . ANKLE SURGERY    .  CHOLECYSTECTOMY  11/15/2011   Procedure: LAPAROSCOPIC CHOLECYSTECTOMY WITH INTRAOPERATIVE CHOLANGIOGRAM;  Surgeon: [name], MD;  [facility];  Service: General;  Laterality: N/A;  . COLONOSCOPY    . COLONOSCOPY WITH PROPOFOL N/A 06/27/2017   Procedure: COLONOSCOPY WITH PROPOFOL;  Surgeon: Milus Banister, MD;  [facility];  Service: Endoscopy;  Laterality: N/A;  . ESOPHAGOGASTRODUODENOSCOPY  12/03/2011   Procedure: ESOPHAGOGASTRODUODENOSCOPY (EGD);  Surgeon: Beryle Beams, MD;  Location[facility];  Service: Endoscopy;  Laterality:  N/A;  . FLEXIBLE SIGMOIDOSCOPY N/A 03/10/2017   Procedure: FLEXIBLE SIGMOIDOSCOPY;  Surgeon: Carol Ada, MD;  [facility];  Service: Endoscopy;  Laterality: N/A;  . INCISION AND DRAINAGE PERIRECTAL ABSCESS N/A 03/01/2017   Procedure: IRRIGATION AND DEBRIDEMENT PERIRECTAL ABSCESS;  Surgeon: Alphonsa Overall, MD;  [facility];  Service: General;  Laterality: N/A;  . IRRIGATION AND DEBRIDEMENT BUTTOCKS N/A 03/23/2017   Procedure: IRRIGATION AND DEBRIDEMENT BUTTOCKS, SETON PLACEMENT;  Surgeon: [name], MD;  [facility];  Service: General;  Laterality: N/A;  . LAPAROSCOPY  11/23/2011   Procedure: LAPAROSCOPY DIAGNOSTIC;  Surgeon: Edward Jolly, MD;  [facility];  Service: General;  Laterality: N/A;  . SIGMOIDOSCOPY    . UPPER GASTROINTESTINAL ENDOSCOPY    . WISDOM TOOTH EXTRACTION     Social History   Occupational History  . Occupation: [employer] call center    Employer: [employer]  Tobacco Use  . Smoking status: Never Smoker  . Smokeless tobacco: Never Used  Vaping Use  . Vaping Use: Never used  Substance and Sexual Activity  . Alcohol use: No    Alcohol/week: 0.0 standard drinks  . Drug use: Yes    Types: Marijuana  . Sexual activity: Not Currently    Partners: Male    Birth control/protection: None

## 2020-06-01 ENCOUNTER — Telehealth: Payer: Self-pay | Admitting: Infectious Diseases

## 2020-06-01 ENCOUNTER — Telehealth: Payer: Self-pay | Admitting: *Deleted

## 2020-06-01 NOTE — Telephone Encounter (Signed)
-----   Message from [name], MD sent at 06/01/2020  5:09 PM EST ----- Regarding: OPAT Please discontinue ceftiaxone and send in a prescription of ciprofloxacin 500 mg po bid to be taken until 3/16.

## 2020-06-01 NOTE — Telephone Encounter (Signed)
Sounds good. I told her to hold the Ceftriaxone and we will repeat labs Monday.  Will send in Rx for cipro to complete through 3/16 as scheduled.

## 2020-06-01 NOTE — Telephone Encounter (Signed)
Per chart, [name] stopped ceftriaxone, sent in ciprofloxacin, and notified the patient. Landis Gandy, RN

## 2020-06-01 NOTE — Telephone Encounter (Signed)
Thanks for the update.   I would DC the ceftriaxone and put her back on Ciprofloxacin 500 mg PO BID ( assuming her recent renal function is OK). Her recent EKG had qtc around 460s.

## 2020-06-01 NOTE — Telephone Encounter (Signed)
Thank you so much

## 2020-06-01 NOTE — Telephone Encounter (Signed)
OPAT Rounding -   Called to d/w [name] regarding acutely elevated LFTs  2/16 results -  Lab Results  Component Value Date   ALT 44 05/16/2020   AST 37 05/16/2020   ALKPHOS 129 (H) 05/16/2020   BILITOT 1.7 (H) 05/16/2020   3/03 Results -  Alk Phos 627 AST 291 ALT 202 Tbili 0.3  She feels pretty baseline- same quality nausea (due to gastroparesis) and diarrhea like before. No new abdominal pain or symptoms.  Will d/w Dr. [place] B[place] about changing agent for her treatment. Will call [facility] to repeat LFT Monday

## 2020-06-03 ENCOUNTER — Encounter: Payer: Self-pay | Admitting: Family Medicine

## 2020-06-04 ENCOUNTER — Emergency Department (HOSPITAL_COMMUNITY): Payer: 59

## 2020-06-04 ENCOUNTER — Other Ambulatory Visit: Payer: Self-pay

## 2020-06-04 ENCOUNTER — Inpatient Hospital Stay (HOSPITAL_COMMUNITY)
Admission: EM | Admit: 2020-06-04 | Discharge: 2020-06-14 | DRG: 616 | Disposition: A | Discharge status: Home Health | Payer: 59 | Attending: Internal Medicine | Admitting: Internal Medicine

## 2020-06-04 ENCOUNTER — Encounter (HOSPITAL_COMMUNITY): Payer: Self-pay | Admitting: Emergency Medicine

## 2020-06-04 DIAGNOSIS — E1165 Type 2 diabetes mellitus with hyperglycemia: Secondary | ICD-10-CM

## 2020-06-04 DIAGNOSIS — T8130XA Disruption of wound, unspecified, initial encounter: Secondary | ICD-10-CM | POA: Diagnosis present

## 2020-06-04 DIAGNOSIS — E10618 Type 1 diabetes mellitus with other diabetic arthropathy: Secondary | ICD-10-CM | POA: Diagnosis not present

## 2020-06-04 DIAGNOSIS — L089 Local infection of the skin and subcutaneous tissue, unspecified: Secondary | ICD-10-CM | POA: Diagnosis present

## 2020-06-04 DIAGNOSIS — J189 Pneumonia, unspecified organism: Secondary | ICD-10-CM | POA: Diagnosis not present

## 2020-06-04 DIAGNOSIS — E1142 Type 2 diabetes mellitus with diabetic polyneuropathy: Secondary | ICD-10-CM | POA: Diagnosis not present

## 2020-06-04 DIAGNOSIS — Z765 Malingerer [conscious simulation]: Secondary | ICD-10-CM

## 2020-06-04 DIAGNOSIS — Z79899 Other long term (current) drug therapy: Secondary | ICD-10-CM

## 2020-06-04 DIAGNOSIS — R112 Nausea with vomiting, unspecified: Secondary | ICD-10-CM | POA: Diagnosis not present

## 2020-06-04 DIAGNOSIS — D6489 Other specified anemias: Secondary | ICD-10-CM | POA: Diagnosis present

## 2020-06-04 DIAGNOSIS — Z20822 Contact with and (suspected) exposure to covid-19: Secondary | ICD-10-CM | POA: Diagnosis present

## 2020-06-04 DIAGNOSIS — I5030 Unspecified diastolic (congestive) heart failure: Secondary | ICD-10-CM | POA: Diagnosis not present

## 2020-06-04 DIAGNOSIS — Z8616 Personal history of COVID-19: Secondary | ICD-10-CM

## 2020-06-04 DIAGNOSIS — I5033 Acute on chronic diastolic (congestive) heart failure: Secondary | ICD-10-CM | POA: Diagnosis present

## 2020-06-04 DIAGNOSIS — Z6829 Body mass index (BMI) 29.0-29.9, adult: Secondary | ICD-10-CM

## 2020-06-04 DIAGNOSIS — E875 Hyperkalemia: Secondary | ICD-10-CM | POA: Diagnosis present

## 2020-06-04 DIAGNOSIS — Z91018 Allergy to other foods: Secondary | ICD-10-CM

## 2020-06-04 DIAGNOSIS — L03115 Cellulitis of right lower limb: Secondary | ICD-10-CM | POA: Diagnosis present

## 2020-06-04 DIAGNOSIS — K5900 Constipation, unspecified: Secondary | ICD-10-CM

## 2020-06-04 DIAGNOSIS — E1143 Type 2 diabetes mellitus with diabetic autonomic (poly)neuropathy: Secondary | ICD-10-CM

## 2020-06-04 DIAGNOSIS — M86271 Subacute osteomyelitis, right ankle and foot: Secondary | ICD-10-CM | POA: Diagnosis not present

## 2020-06-04 DIAGNOSIS — T8781 Dehiscence of amputation stump: Secondary | ICD-10-CM | POA: Diagnosis present

## 2020-06-04 DIAGNOSIS — J690 Pneumonitis due to inhalation of food and vomit: Secondary | ICD-10-CM | POA: Diagnosis present

## 2020-06-04 DIAGNOSIS — I130 Hypertensive heart and chronic kidney disease with heart failure and stage 1 through stage 4 chronic kidney disease, or unspecified chronic kidney disease: Secondary | ICD-10-CM | POA: Diagnosis present

## 2020-06-04 DIAGNOSIS — M869 Osteomyelitis, unspecified: Secondary | ICD-10-CM | POA: Diagnosis present

## 2020-06-04 DIAGNOSIS — R0602 Shortness of breath: Secondary | ICD-10-CM | POA: Diagnosis not present

## 2020-06-04 DIAGNOSIS — J9621 Acute and chronic respiratory failure with hypoxia: Secondary | ICD-10-CM | POA: Diagnosis present

## 2020-06-04 DIAGNOSIS — E1022 Type 1 diabetes mellitus with diabetic chronic kidney disease: Secondary | ICD-10-CM | POA: Diagnosis present

## 2020-06-04 DIAGNOSIS — K3184 Gastroparesis: Secondary | ICD-10-CM | POA: Diagnosis present

## 2020-06-04 DIAGNOSIS — I110 Hypertensive heart disease with heart failure: Secondary | ICD-10-CM | POA: Diagnosis not present

## 2020-06-04 DIAGNOSIS — E10649 Type 1 diabetes mellitus with hypoglycemia without coma: Secondary | ICD-10-CM | POA: Diagnosis not present

## 2020-06-04 DIAGNOSIS — F419 Anxiety disorder, unspecified: Secondary | ICD-10-CM | POA: Diagnosis present

## 2020-06-04 DIAGNOSIS — K219 Gastro-esophageal reflux disease without esophagitis: Secondary | ICD-10-CM | POA: Diagnosis present

## 2020-06-04 DIAGNOSIS — E1169 Type 2 diabetes mellitus with other specified complication: Secondary | ICD-10-CM | POA: Diagnosis present

## 2020-06-04 DIAGNOSIS — T148XXA Other injury of unspecified body region, initial encounter: Secondary | ICD-10-CM | POA: Diagnosis not present

## 2020-06-04 DIAGNOSIS — J9601 Acute respiratory failure with hypoxia: Secondary | ICD-10-CM | POA: Diagnosis present

## 2020-06-04 DIAGNOSIS — E43 Unspecified severe protein-calorie malnutrition: Secondary | ICD-10-CM | POA: Diagnosis present

## 2020-06-04 DIAGNOSIS — M797 Fibromyalgia: Secondary | ICD-10-CM | POA: Diagnosis present

## 2020-06-04 DIAGNOSIS — E876 Hypokalemia: Secondary | ICD-10-CM | POA: Diagnosis not present

## 2020-06-04 DIAGNOSIS — F32A Depression, unspecified: Secondary | ICD-10-CM | POA: Diagnosis present

## 2020-06-04 DIAGNOSIS — E1043 Type 1 diabetes mellitus with diabetic autonomic (poly)neuropathy: Secondary | ICD-10-CM | POA: Diagnosis present

## 2020-06-04 DIAGNOSIS — N1832 Chronic kidney disease, stage 3b: Secondary | ICD-10-CM | POA: Diagnosis present

## 2020-06-04 DIAGNOSIS — E1069 Type 1 diabetes mellitus with other specified complication: Principal | ICD-10-CM | POA: Diagnosis present

## 2020-06-04 DIAGNOSIS — M86071 Acute hematogenous osteomyelitis, right ankle and foot: Secondary | ICD-10-CM | POA: Diagnosis not present

## 2020-06-04 DIAGNOSIS — Z833 Family history of diabetes mellitus: Secondary | ICD-10-CM

## 2020-06-04 DIAGNOSIS — E739 Lactose intolerance, unspecified: Secondary | ICD-10-CM | POA: Diagnosis present

## 2020-06-04 DIAGNOSIS — Y835 Amputation of limb(s) as the cause of abnormal reaction of the patient, or of later complication, without mention of misadventure at the time of the procedure: Secondary | ICD-10-CM | POA: Diagnosis present

## 2020-06-04 DIAGNOSIS — E8809 Other disorders of plasma-protein metabolism, not elsewhere classified: Secondary | ICD-10-CM | POA: Diagnosis present

## 2020-06-04 DIAGNOSIS — Z9049 Acquired absence of other specified parts of digestive tract: Secondary | ICD-10-CM

## 2020-06-04 DIAGNOSIS — R9389 Abnormal findings on diagnostic imaging of other specified body structures: Secondary | ICD-10-CM

## 2020-06-04 DIAGNOSIS — I10 Essential (primary) hypertension: Secondary | ICD-10-CM | POA: Diagnosis not present

## 2020-06-04 DIAGNOSIS — J9611 Chronic respiratory failure with hypoxia: Secondary | ICD-10-CM

## 2020-06-04 DIAGNOSIS — IMO0002 Reserved for concepts with insufficient information to code with codable children: Secondary | ICD-10-CM

## 2020-06-04 DIAGNOSIS — Z89511 Acquired absence of right leg below knee: Secondary | ICD-10-CM

## 2020-06-04 DIAGNOSIS — Z794 Long term (current) use of insulin: Secondary | ICD-10-CM

## 2020-06-04 LAB — COMPREHENSIVE METABOLIC PANEL
ALT: 58 U/L — ABNORMAL HIGH (ref 0–44)
AST: 34 U/L (ref 15–41)
Albumin: 2 g/dL — ABNORMAL LOW (ref 3.5–5.0)
Alkaline Phosphatase: 253 U/L — ABNORMAL HIGH (ref 38–126)
Anion gap: 4 — ABNORMAL LOW (ref 5–15)
BUN: 33 mg/dL — ABNORMAL HIGH (ref 6–20)
CO2: 22 mmol/L (ref 22–32)
Calcium: 8.3 mg/dL — ABNORMAL LOW (ref 8.9–10.3)
Chloride: 110 mmol/L (ref 98–111)
Creatinine, Ser: 1.3 mg/dL — ABNORMAL HIGH (ref 0.44–1.00)
GFR, Estimated: 57 mL/min — ABNORMAL LOW (ref >60)
Glucose, Bld: 77 mg/dL (ref 70–99)
Potassium: 7 mmol/L (ref 3.5–5.1)
Sodium: 136 mmol/L (ref 135–145)
Total Bilirubin: 0.6 mg/dL (ref 0.3–1.2)
Total Protein: 6.8 g/dL (ref 6.5–8.1)

## 2020-06-04 LAB — URINALYSIS, ROUTINE W REFLEX MICROSCOPIC
Bilirubin Urine: NEGATIVE
Glucose, UA: NEGATIVE mg/dL
Ketones, ur: NEGATIVE mg/dL
Leukocytes,Ua: NEGATIVE
Nitrite: NEGATIVE
Protein, ur: 100 mg/dL — AB
Specific Gravity, Urine: 1.012 (ref 1.005–1.030)
pH: 5 (ref 5.0–8.0)

## 2020-06-04 LAB — BRAIN NATRIURETIC PEPTIDE: B Natriuretic Peptide: 444.7 pg/mL — ABNORMAL HIGH (ref 0.0–100.0)

## 2020-06-04 LAB — CBC WITH DIFFERENTIAL/PLATELET
Abs Immature Granulocytes: 0.05 10*3/uL (ref 0.00–0.07)
Basophils Absolute: 0 10*3/uL (ref 0.0–0.1)
Basophils Relative: 1 %
Eosinophils Absolute: 0.5 10*3/uL (ref 0.0–0.5)
Eosinophils Relative: 6 %
HCT: 26.6 % — ABNORMAL LOW (ref 36.0–46.0)
Hemoglobin: 7.9 g/dL — ABNORMAL LOW (ref 12.0–15.0)
Immature Granulocytes: 1 %
Lymphocytes Relative: 9 %
Lymphs Abs: 0.8 10*3/uL (ref 0.7–4.0)
MCH: 28.7 pg (ref 26.0–34.0)
MCHC: 29.7 g/dL — ABNORMAL LOW (ref 30.0–36.0)
MCV: 96.7 fL (ref 80.0–100.0)
Monocytes Absolute: 1.3 10*3/uL — ABNORMAL HIGH (ref 0.1–1.0)
Monocytes Relative: 15 %
Neutro Abs: 6.2 10*3/uL (ref 1.7–7.7)
Neutrophils Relative %: 68 %
Platelets: 236 10*3/uL (ref 150–400)
RBC: 2.75 MIL/uL — ABNORMAL LOW (ref 3.87–5.11)
RDW: 15.9 % — ABNORMAL HIGH (ref 11.5–15.5)
WBC: 8.9 10*3/uL (ref 4.0–10.5)
nRBC: 0 % (ref 0.0–0.2)

## 2020-06-04 LAB — POTASSIUM: Potassium: 6.3 mmol/L (ref 3.5–5.1)

## 2020-06-04 LAB — RENAL FUNCTION PANEL
Albumin: 1.7 g/dL — ABNORMAL LOW (ref 3.5–5.0)
Anion gap: 8 (ref 5–15)
BUN: 27 mg/dL — ABNORMAL HIGH (ref 6–20)
CO2: 20 mmol/L — ABNORMAL LOW (ref 22–32)
Calcium: 8.3 mg/dL — ABNORMAL LOW (ref 8.9–10.3)
Chloride: 103 mmol/L (ref 98–111)
Creatinine, Ser: 1.4 mg/dL — ABNORMAL HIGH (ref 0.44–1.00)
GFR, Estimated: 52 mL/min — ABNORMAL LOW (ref >60)
Glucose, Bld: 285 mg/dL — ABNORMAL HIGH (ref 70–99)
Phosphorus: 4.4 mg/dL (ref 2.5–4.6)
Potassium: 5.8 mmol/L — ABNORMAL HIGH (ref 3.5–5.1)
Sodium: 131 mmol/L — ABNORMAL LOW (ref 135–145)

## 2020-06-04 LAB — RESP PANEL BY RT-PCR (FLU A&B, COVID) ARPGX2
Influenza A by PCR: NEGATIVE
Influenza B by PCR: NEGATIVE
SARS Coronavirus 2 by RT PCR: NEGATIVE

## 2020-06-04 LAB — CBG MONITORING, ED
Glucose-Capillary: 83 mg/dL (ref 70–99)
Glucose-Capillary: 41 mg/dL — CL (ref 70–99)
Glucose-Capillary: 88 mg/dL (ref 70–99)
Glucose-Capillary: 41 mg/dL — CL (ref 70–99)
Glucose-Capillary: 94 mg/dL (ref 70–99)
Glucose-Capillary: 164 mg/dL — ABNORMAL HIGH (ref 70–99)

## 2020-06-04 LAB — HEMOGLOBIN A1C
Hgb A1c MFr Bld: 6.9 % — ABNORMAL HIGH (ref 4.8–5.6)
Mean Plasma Glucose: 151.33 mg/dL

## 2020-06-04 LAB — BLOOD GAS, VENOUS
Acid-base deficit: 2.7 mmol/L — ABNORMAL HIGH (ref 0.0–2.0)
Bicarbonate: 22.3 mmol/L (ref 20.0–28.0)
O2 Saturation: 95.4 %
Patient temperature: 98.6
pCO2, Ven: 43 mmHg — ABNORMAL LOW (ref 44.0–60.0)
pH, Ven: 7.336 (ref 7.250–7.430)
pO2, Ven: 75.7 mmHg — ABNORMAL HIGH (ref 32.0–45.0)

## 2020-06-04 LAB — PROTIME-INR
INR: 1.2 (ref 0.8–1.2)
Prothrombin Time: 14.9 seconds (ref 11.4–15.2)

## 2020-06-04 LAB — I-STAT BETA HCG BLOOD, ED (MC, WL, AP ONLY): I-stat hCG, quantitative: <5 m[IU]/mL (ref <5)

## 2020-06-04 LAB — APTT: aPTT: 36 seconds (ref 24–36)

## 2020-06-04 LAB — LACTIC ACID, PLASMA: Lactic Acid, Venous: 0.6 mmol/L (ref 0.5–1.9)

## 2020-06-04 LAB — TROPONIN I (HIGH SENSITIVITY): Troponin I (High Sensitivity): 7 ng/L (ref <18)

## 2020-06-04 LAB — GLUCOSE, CAPILLARY: Glucose-Capillary: 283 mg/dL — ABNORMAL HIGH (ref 70–99)

## 2020-06-04 IMAGING — CT CT FOOT*R* W/CM
4 of 5 series · 12 of 28 positions shown, 13 images · IV contrast (omnipaque)
Comparison: X-ray [DATE], MRI [DATE], [DATE]

CLINICAL DATA: 29-year-old female with diabetes and foot swelling

EXAM:
CT OF THE LOWER RIGHT EXTREMITY WITH CONTRAST
TECHNIQUE: Multidetector CT imaging of the lower right extremity was performed
according to the standard protocol following intravenous contrast
administration.
CONTRAST:  75mL OMNIPAQUE IOHEXOL 300 MG/ML SOLN, 75mL OMNIPAQUE
IOHEXOL 350 MG/ML SOLN

[Series 4: axial st · axial · 0.48mm/px · z∈[-1508,-1428]mm · 3 of 82 slices shown, 4 images]
[im 21/82  soft-tissue]
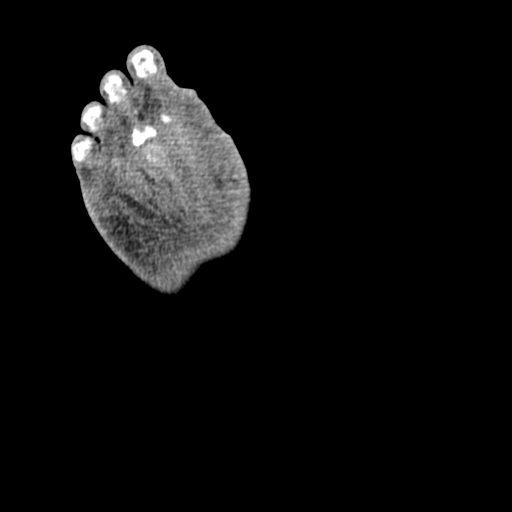
[im 21/82  bone]
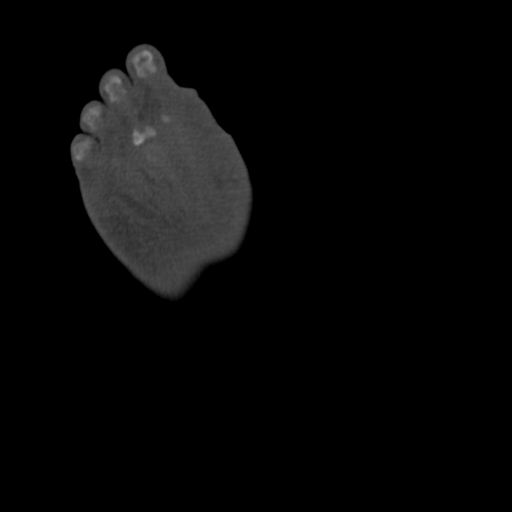
[im 41/82  bone]
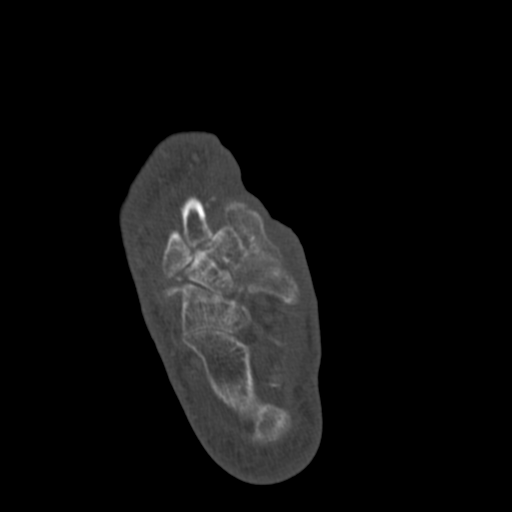
[im 61/82  bone]
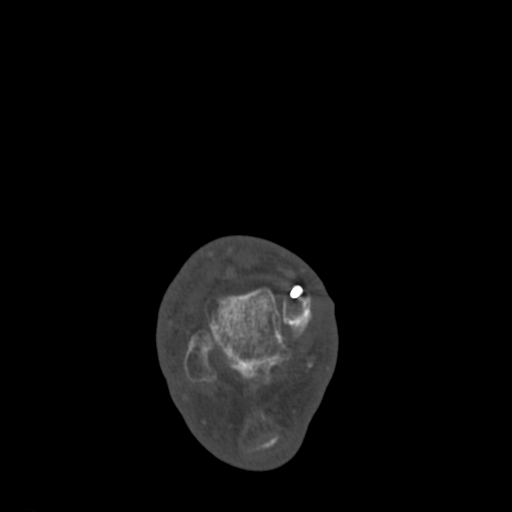

[Series 7: coronal bone · axial · 0.32mm/px · z∈[-1437,-1361]mm · 3 of 87 slices shown]
[im 22/87  bone]
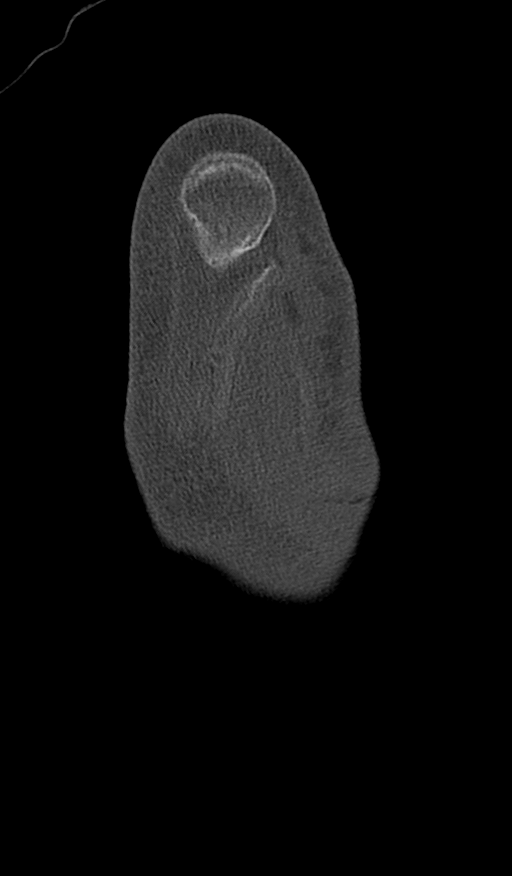
[im 44/87  bone]
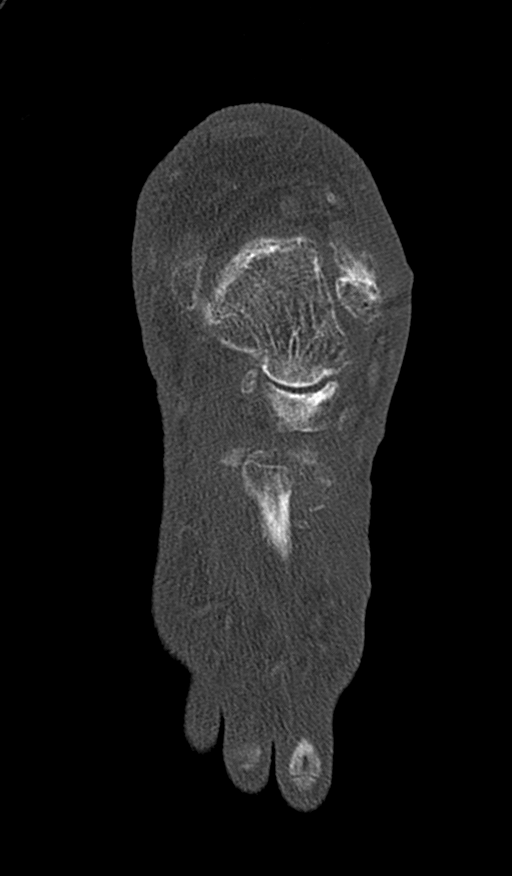
[im 65/87  bone]
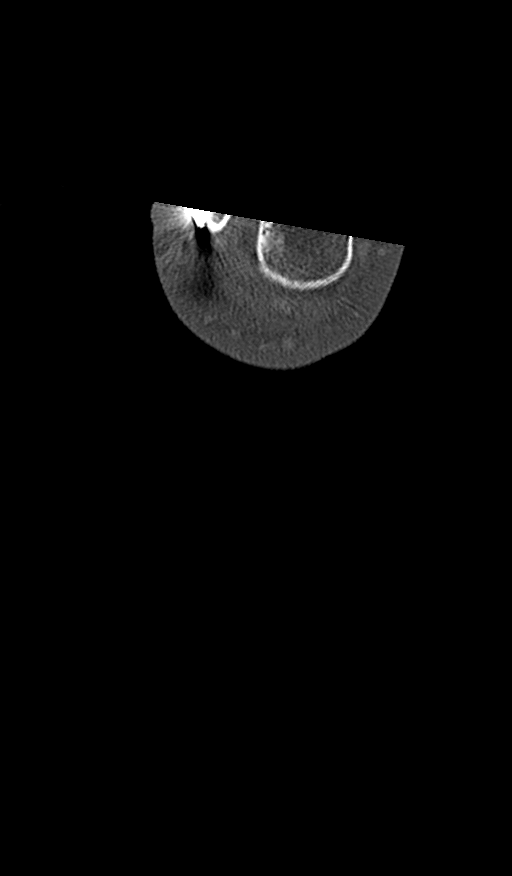

[Series 8: sagittal bone · sagittal · 0.36mm/px · 5 of 78 slices shown]
[im 13/78  bone]
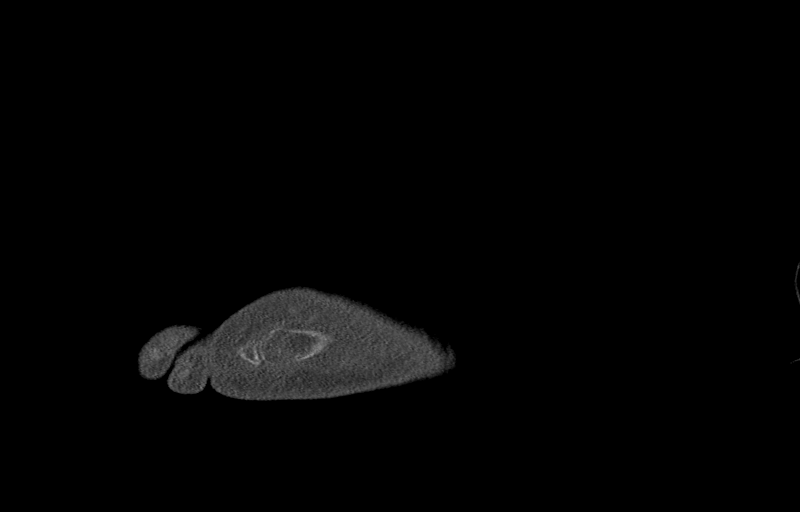
[im 26/78  bone]
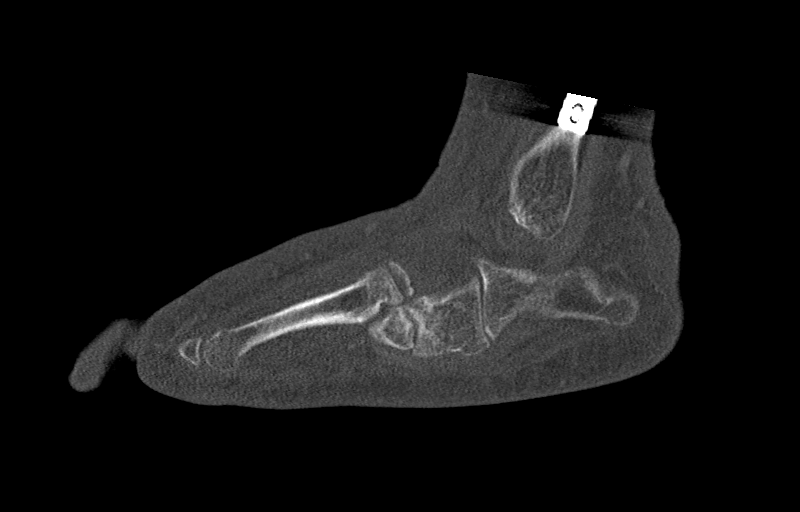
[im 39/78  bone]
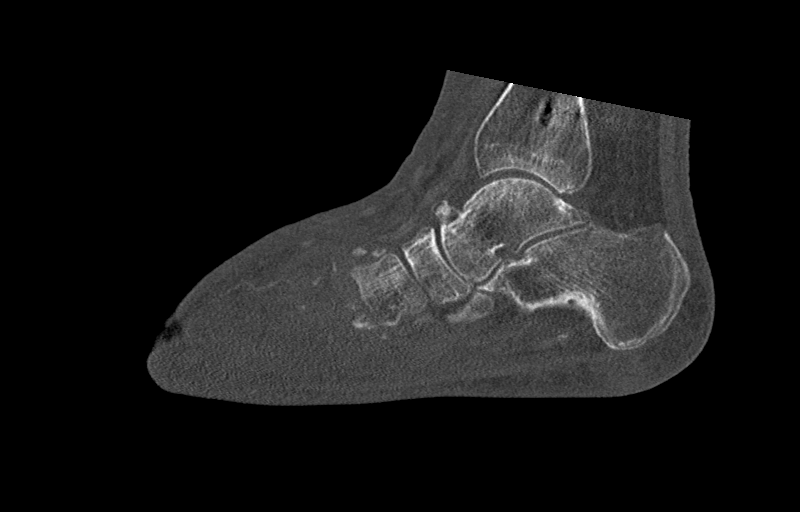
[im 52/78  bone]
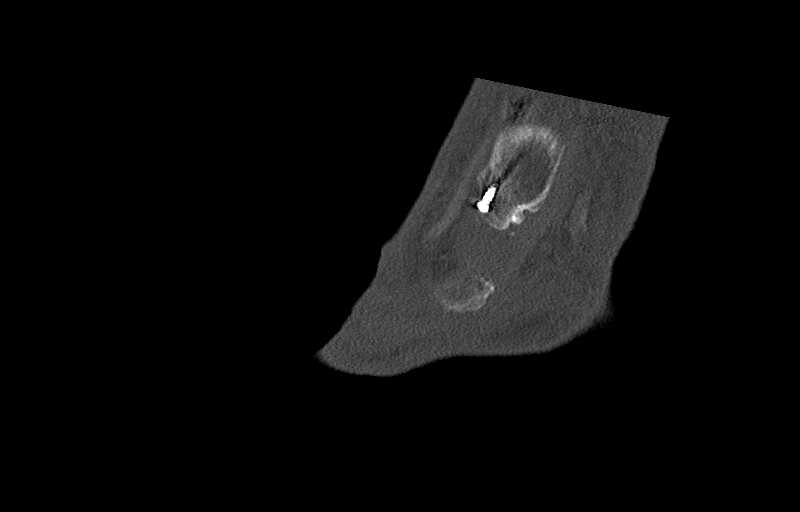
[im 65/78  bone]
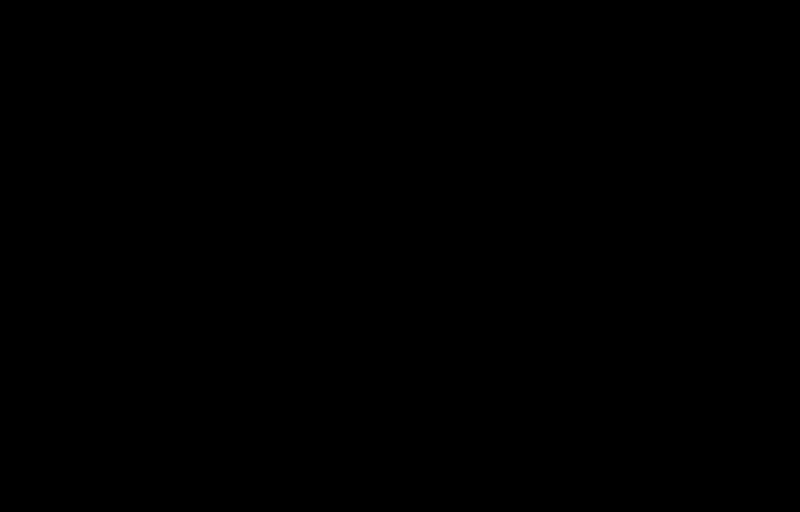

[Series 10: coronal st · axial · 0.32mm/px · 1 of 68 slices shown]
[im 23/68  bone]
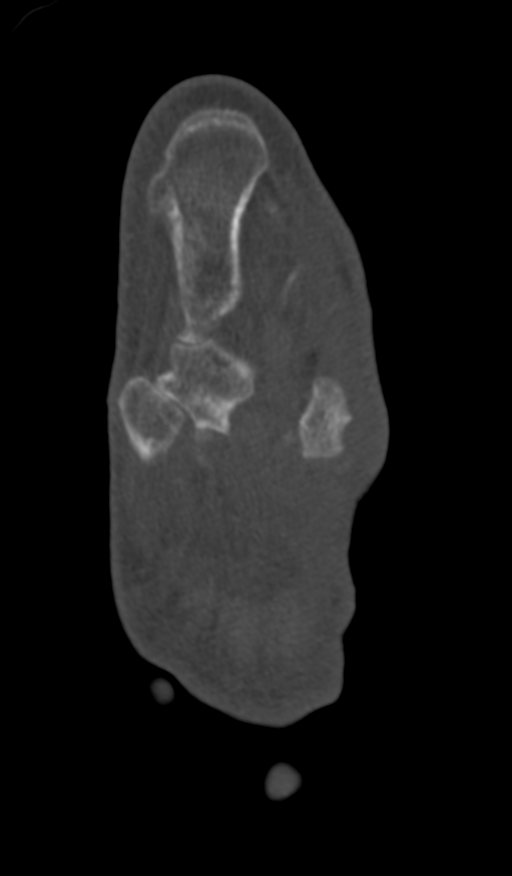

[12 of 28 positions shown; findings below may reference images not displayed]

FINDINGS: Surgical wound at the medial aspect of the forefoot related to
recent first ray resection. Erosive changes within the distal aspect
of the medial cuneiform is highly suspicious for osteomyelitis
(series 3, images 45-48).

Redemonstration of Lisfranc fracture dislocation with dorsal and
lateral dislocation of the second metatarsal base at the second TMT
joint. There is also dorsal subluxation of the third metatarsal base
at the third TMT joint. Numerous small fractures at the TMT joints
including within the second and third metatarsal bases as well as at
the medial and intermediate cuneiform bones. There is also
irregularity at the dorsal aspect of the distal cuboid. Scattered
areas of heterotopic bone formation at the TMT joints. Bones are
demineralized. Partially visualized hardware at the medial malleolus
and distal fibula.

Soft tissue thickening at the medial aspect of the forefoot. No
organized or rim enhancing fluid collection. Denervation changes of
the intrinsic foot musculature. No significant tenosynovial fluid.
IMPRESSION: 1. Surgical wound at the medial aspect of the forefoot related to
recent first ray resection. Erosive changes within the distal aspect
of the medial cuneiform is highly suspicious for osteomyelitis.
2. Redemonstration of Lisfranc fracture-dislocation with dorsal and
lateral dislocation of the second metatarsal base at the second TMT
joint. There is also dorsal subluxation of the third metatarsal base
at the third TMT joint.
3. Numerous fractures at the TMT joints including within the second
and third metatarsal bases as well as at the medial and intermediate
cuneiform bones, and distal cuboid.
4. No organized or rim enhancing fluid collection.

## 2020-06-04 IMAGING — DX DG CHEST 1V PORT
1 series · 1 of 1 positions shown · non-contrast
Comparison: [DATE]

CLINICAL DATA: Sepsis

EXAM:
PORTABLE CHEST 1 VIEW

[chest ap]
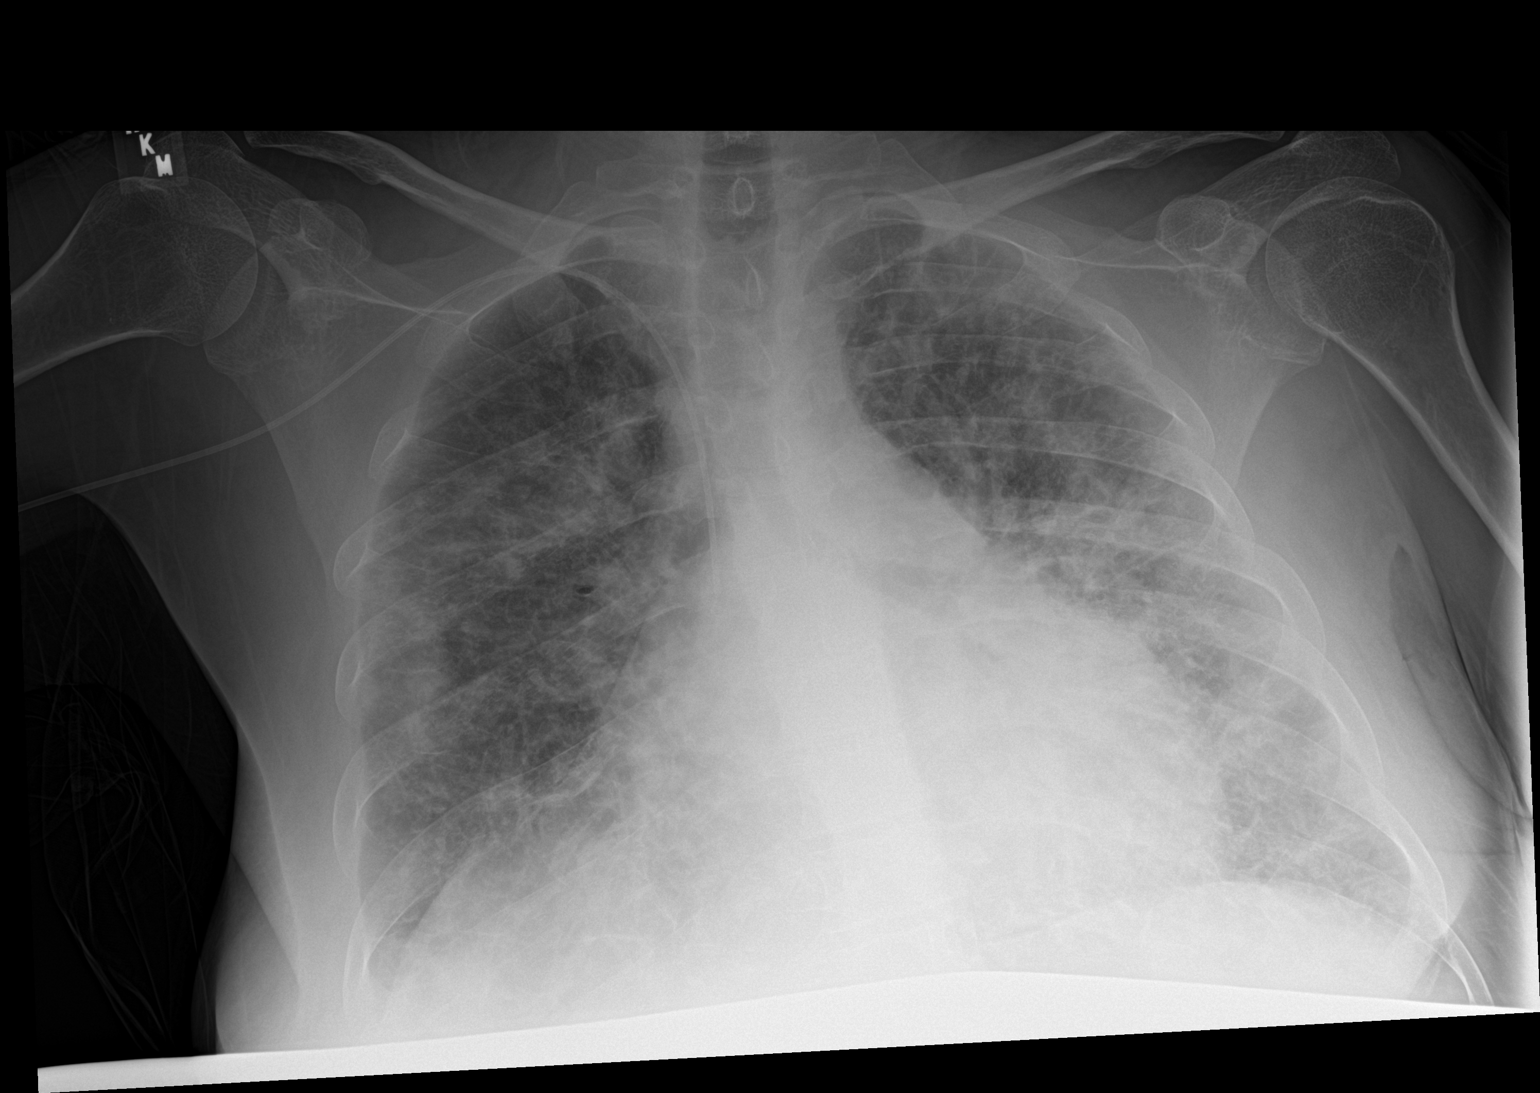

[1 of 1 positions shown; findings below may reference images not displayed]

FINDINGS: Right upper extremity PICC line tip within the superior vena cava.
Diffuse asymmetric airspace infiltrate has improved, particularly
within the right upper lobe, though persists in keeping with
multifocal pneumonia. No pneumothorax or pleural effusion. Interval
development of right lower lobe collapse. Cardiac size is mildly
enlarged, unchanged. No acute bone abnormality.
IMPRESSION: Interval development of right lower lobe collapse.

Improving multifocal pulmonary infiltrates, most in keeping with
multifocal pneumonia.

Stable cardiomegaly

## 2020-06-04 IMAGING — CT CT ANGIO CHEST
2 of 6 series · 17 of 36 positions shown · IV contrast (omnipaque)
Comparison: Chest radiograph-[DATE]; [DATE]; [DATE]

CLINICAL DATA: Chest pain. Shortness of breath. Pleurisy versus
pleural effusion.

EXAM:
CT ANGIOGRAPHY CHEST WITH CONTRAST
TECHNIQUE: Multidetector CT imaging of the chest was performed using the
standard protocol during bolus administration of intravenous
contrast. Multiplanar CT image reconstructions and MIPs were
obtained to evaluate the vascular anatomy.
CONTRAST:  75mL OMNIPAQUE IOHEXOL 300 MG/ML SOLN, 75mL OMNIPAQUE
IOHEXOL 350 MG/ML SOLN

[Series 5: thins · axial · 0.76mm/px · z∈[-301,-68]mm · 16 of 263 slices shown]
[im 15/263  lung]
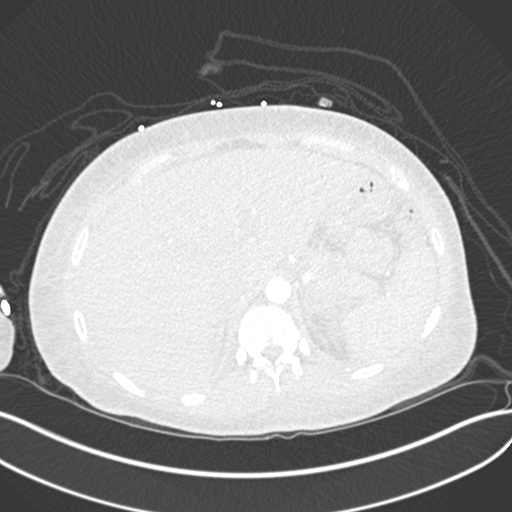
[im 30/263  mediastinal]
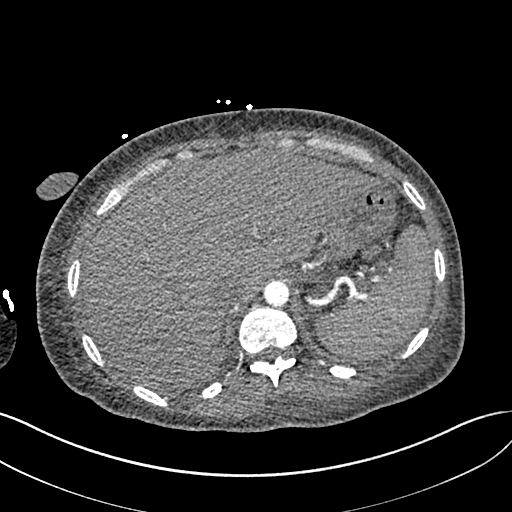
[im 44/263  lung]
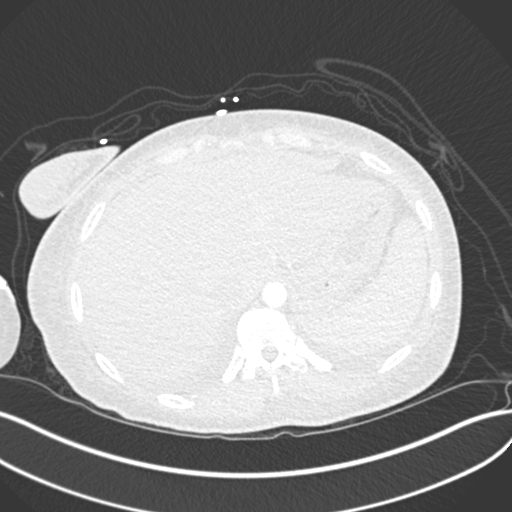
[im 59/263  mediastinal]
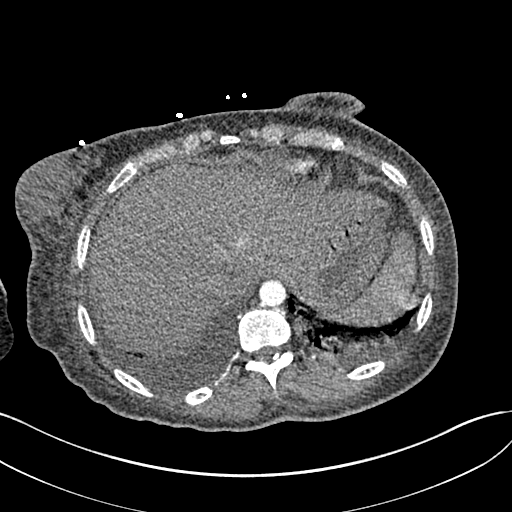
[im 73/263  lung]
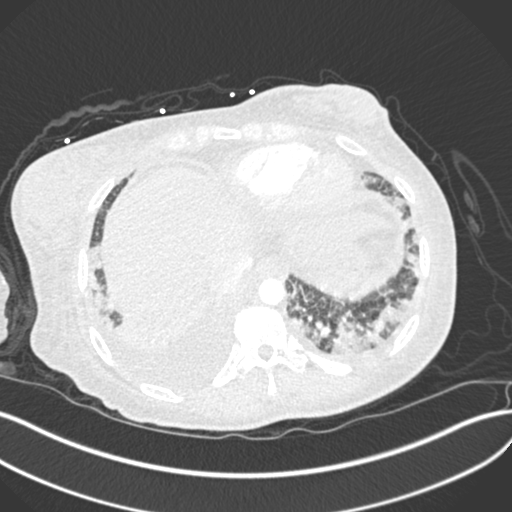
[im 88/263  mediastinal]
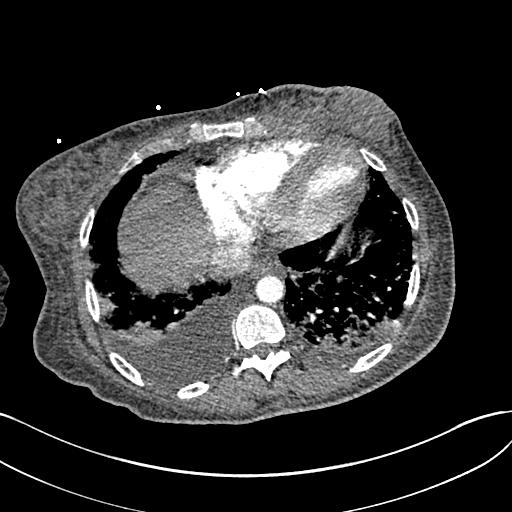
[im 102/263  lung]
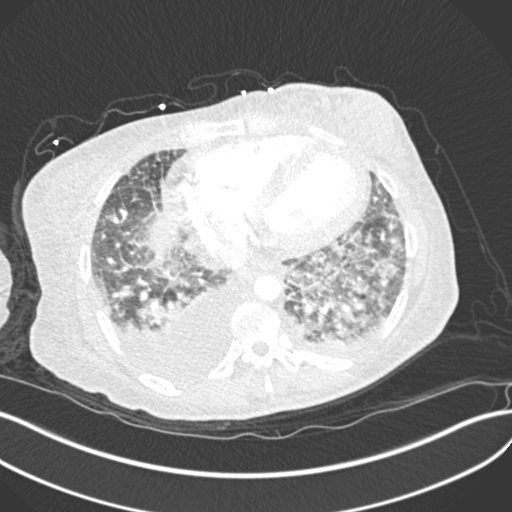
[im 117/263  mediastinal]
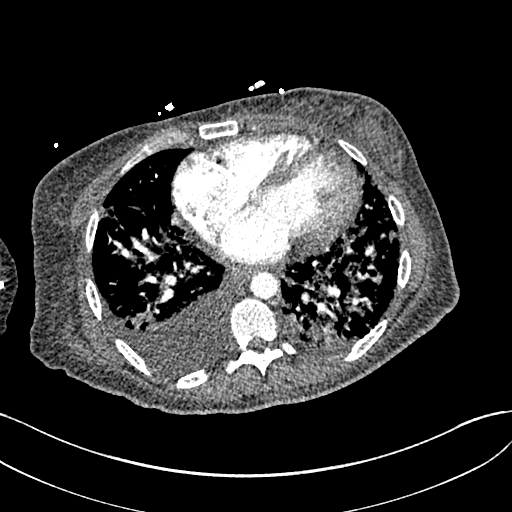
[im 146/263  lung]
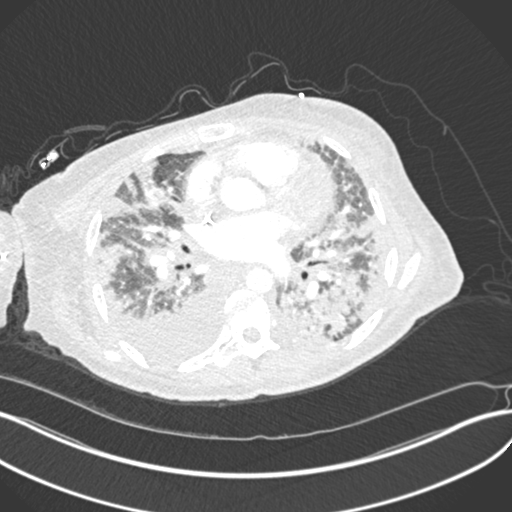
[im 161/263  mediastinal]
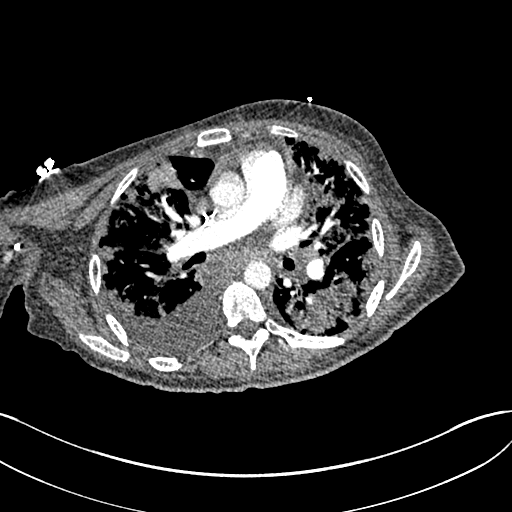
[im 175/263  lung]
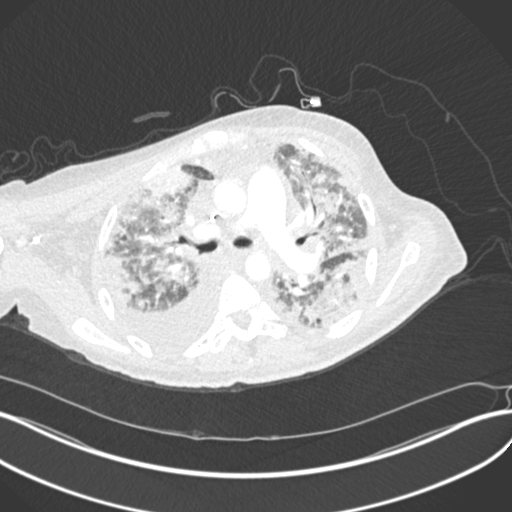
[im 190/263  mediastinal]
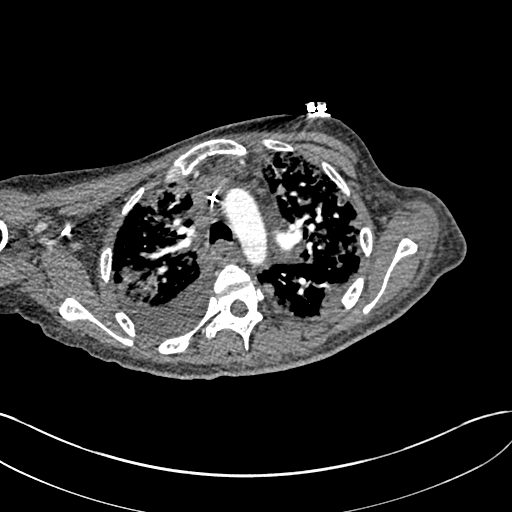
[im 204/263  lung]
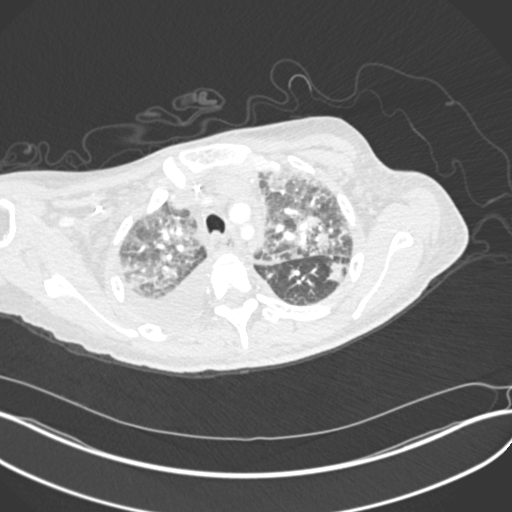
[im 219/263  mediastinal]
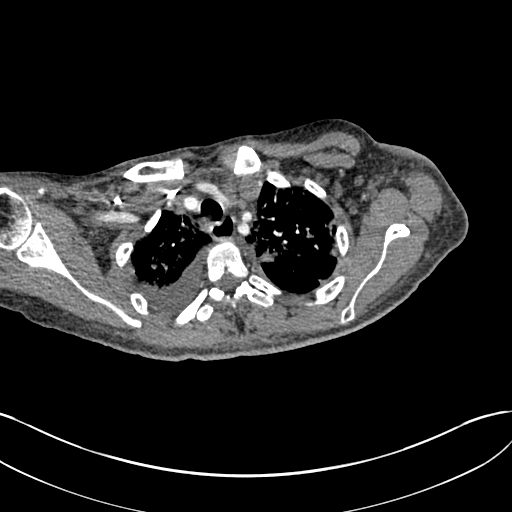
[im 233/263  lung]
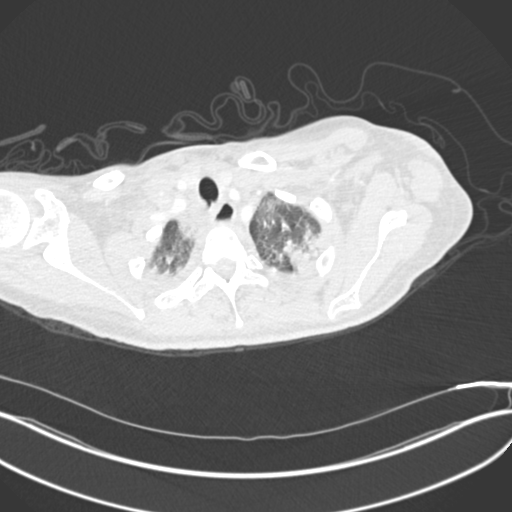
[im 248/263  mediastinal]
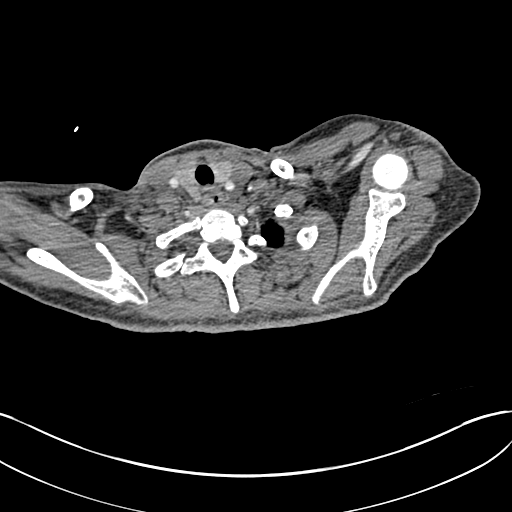

[Series 7: coronal mpr · coronal · 0.57mm/px · 1 of 142 slices shown]
[im 71/142  mediastinal]
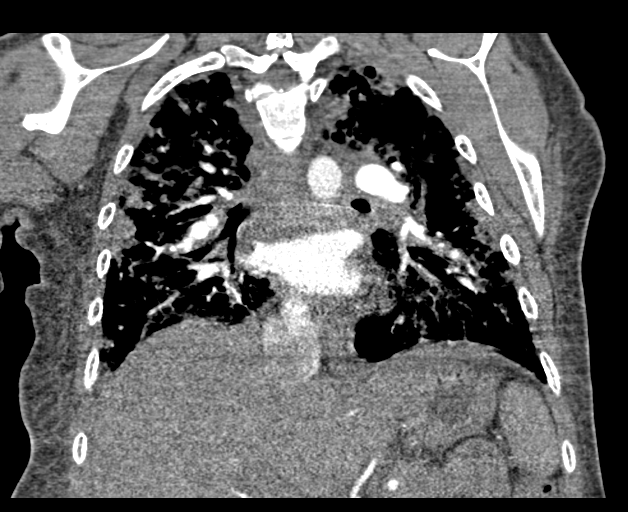

[17 of 36 positions shown; findings below may reference images not displayed]

FINDINGS: Vascular Findings:

There is adequate opacification of the pulmonary arterial system
with the main pulmonary artery measuring 248 Hounsfield units. There
are no discrete filling defects within the pulmonary arterial tree
to the level of the bilateral subsegmental pulmonary arteries.
Evaluation of the distal subsegmental pulmonary arteries is degraded
secondary to patient respiratory artifact. Normal caliber of the
main pulmonary artery.

Borderline cardiomegaly.  No pericardial effusion.

No evidence of thoracic aortic aneurysm or dissection.

Bovine configuration of the aortic arch. The branch vessels of the
aortic arch appear widely patent throughout their imaged courses.
The descending thoracic aorta is of normal caliber and widely patent
without hemodynamically significant narrowing.

Right upper extremity PICC line tip terminates within the superior
cavoatrial junction.

Review of the MIP images confirms the above findings.

----------------------------------------------------------------------------------

Nonvascular Findings:

Mediastinum/Lymph Nodes: Mediastinal, bilateral hilar and axillary
lymph nodes are prominent though individually not enlarged by size
criteria with index left axillary lymph node measuring 0.8 cm in
greatest short axis diameter (image 21, series 4) and index right
axillary lymph node measuring 0.6 cm (image 45), presumably reactive
in etiology.

Lungs/Pleura: Small to moderate sized right-sided pleural effusion
with trace left-sided pleural effusion.

Extensive bilateral slightly nodular heterogeneous and airspace
opacities with diffuse interlobular septal thickening. Bibasilar
subpleural consolidative opacities. Relative areas of confluence are
seen within the superior segment of the left lower lobe (image 50,
series 6), with associated air bronchograms.

The central pulmonary airways appear patent.  No pneumothorax.

Upper abdomen: Limited early arterial phase evaluation of the upper
abdomen is normal.

Musculoskeletal: Diffuse body wall anasarca. No acute or aggressive
osseous abnormalities. There is a punctate bone island within the T6
vertebral body.
IMPRESSION: 1. No evidence of pulmonary embolism.
2. Small to moderate-sized right-sided pleural effusion and trace
left-sided pleural effusion.
3. Extensive bilateral airspace opacities and interstitial
thickening, nonspecific with broad differential considerations
including multi lobar infection (including atypical etiology) and
pulmonary edema.
4. Borderline cardiomegaly.
5. Diffuse body wall anasarca.
6. Prominent though non pathologically enlarged mediastinal,
bilateral hilar and axillary lymph nodes, nonspecific though
presumably reactive in etiology.

## 2020-06-04 IMAGING — DX DG FOOT COMPLETE 3+V*R*
3 series · 3 of 3 positions shown · non-contrast
Comparison: None.

CLINICAL DATA: Sepsis

EXAM:
RIGHT FOOT COMPLETE - 3+ VIEW

[foot ap]
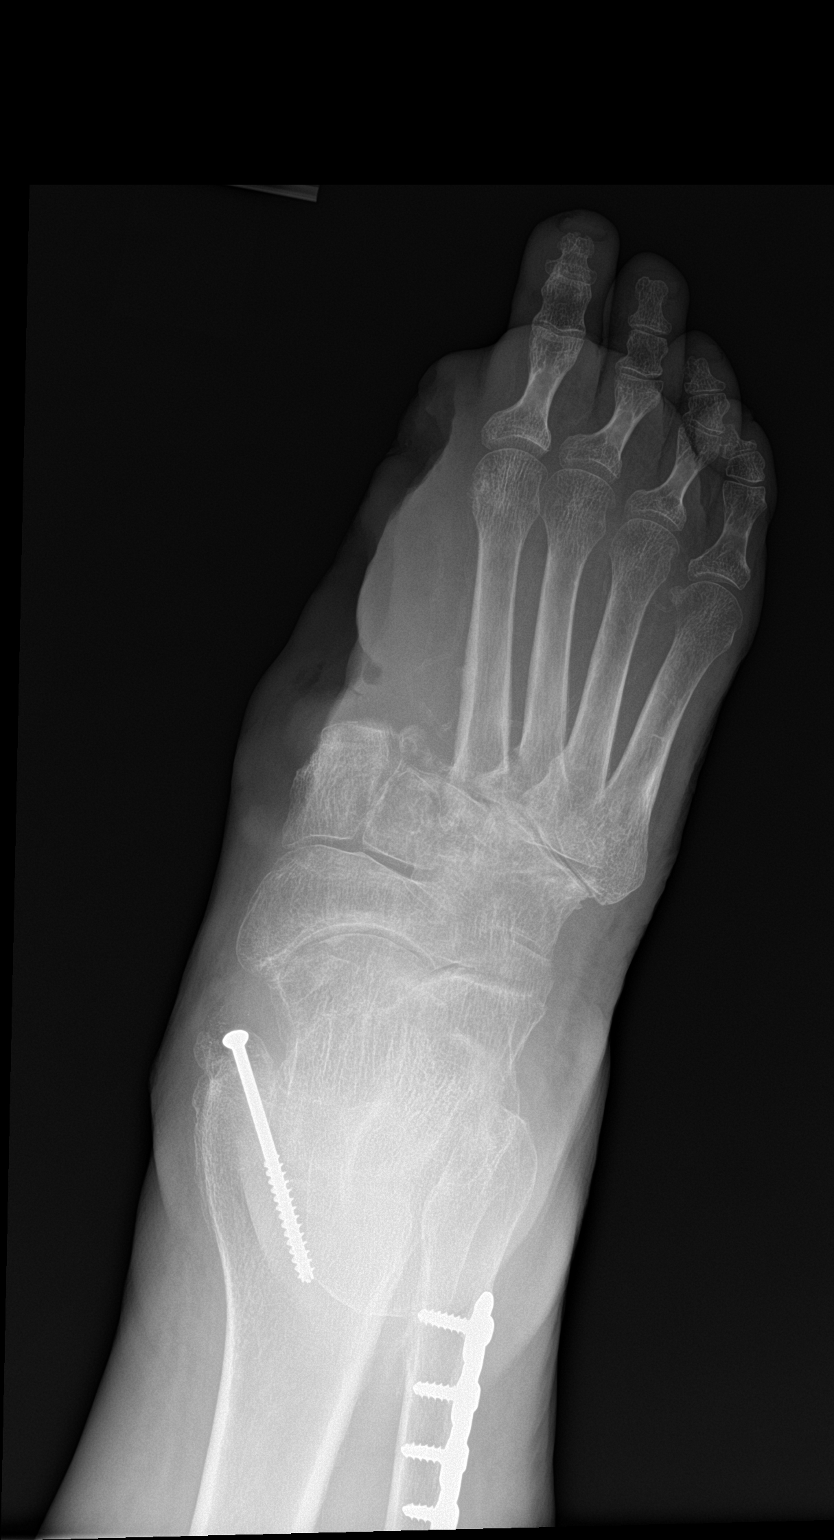

[foot obl]
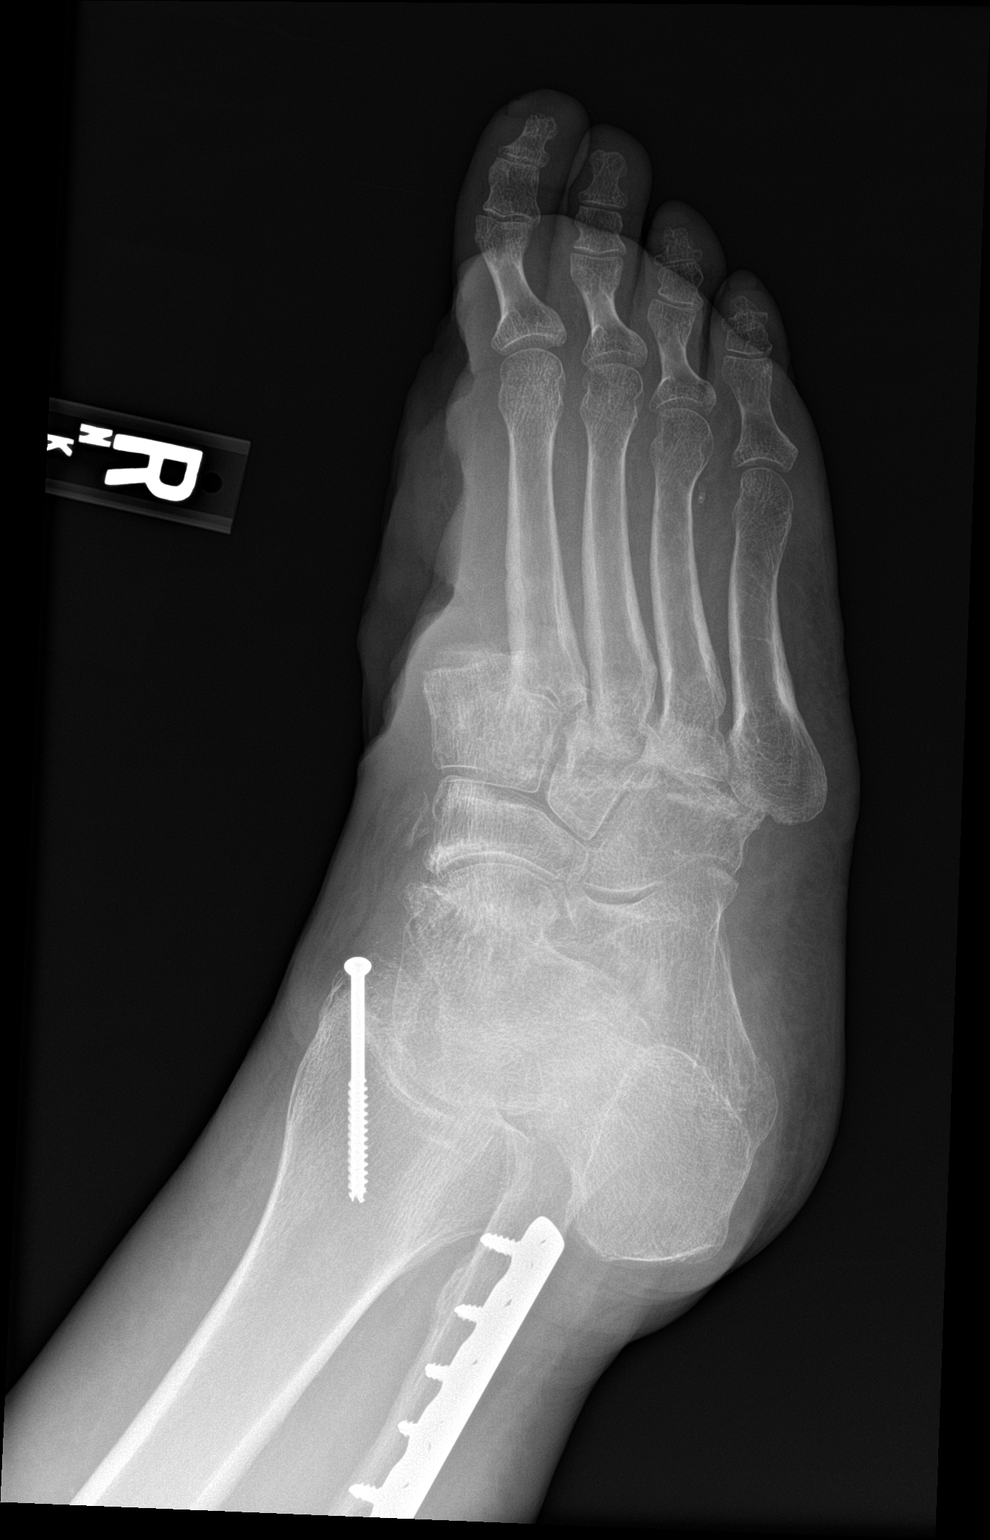

[foot lat]
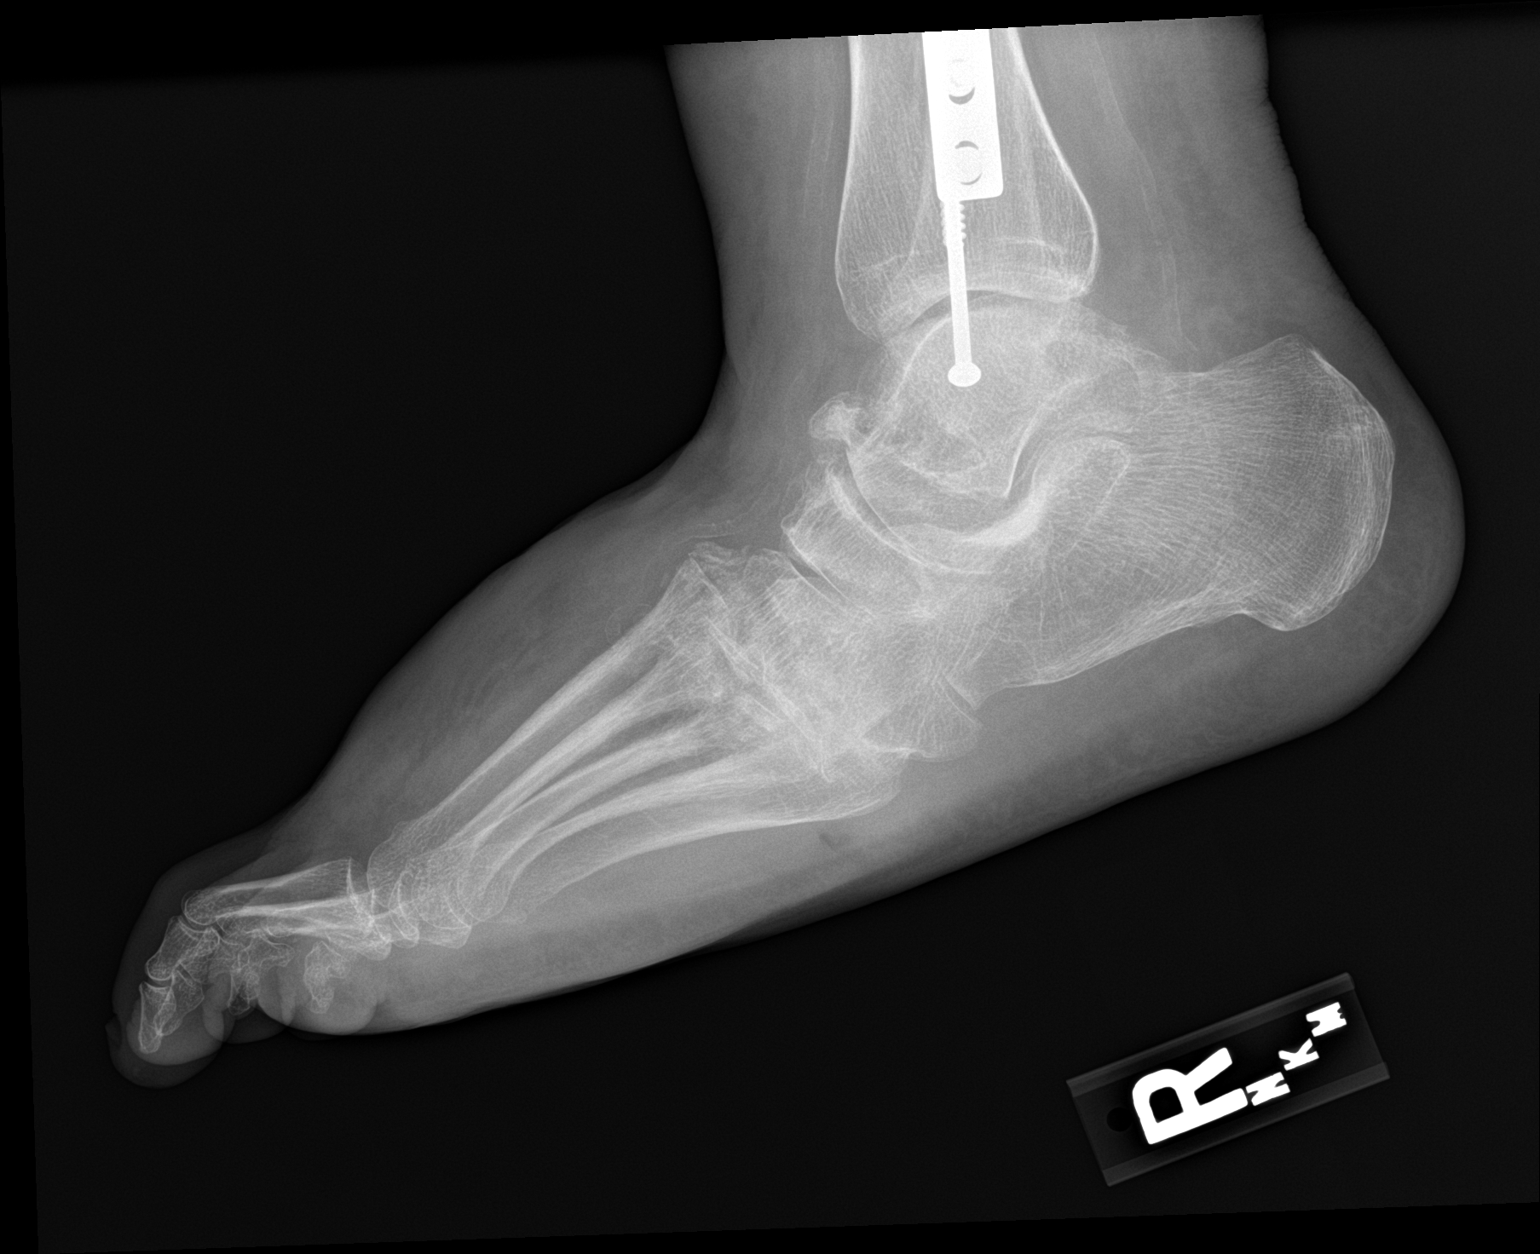

[3 of 3 positions shown; findings below may reference images not displayed]

FINDINGS: Three view radiograph of the right foot demonstrates surgical
changes of first ray amputation distal to the a medial cuneiform.
There is extensive soft tissue swelling of the right forefoot as
well as a large open defect involving the medial aspect of the right
forefoot in keeping with dehiscence of the surgical wound.

Additionally, there has developed a fracture of the base of the
second metatarsal with lateral subluxation of the second and likely
the third and fourth metatarsal shafts in keeping with a Lisfranc
fracture dislocation.

ORIF of the medial malleolus utilizing a single partially threaded
screw and distal fibular ORIF utilizing lateral plate and screws are
partially visualized. Advanced vascular calcifications are seen
within the soft tissues.
IMPRESSION: Open wound involving the site of right first ray amputation with
extensive soft tissue swelling of the right forefoot in keeping with
dehiscence of the surgical wound and probable superimposed
cellulitis.

Interval development of a Lisfranc fracture dislocation with mild
lateral subluxation of the second, third, and fourth metatarsals.
This may be better assessed with CT imaging.

## 2020-06-04 MED ORDER — MIRTAZAPINE 15 MG PO TABS
30.0000 mg | ORAL_TABLET | Freq: Every day | ORAL | Status: DC
  Administered 2020-06-04 – 2020-06-13 (×8): 30 mg via ORAL
  Filled 2020-06-04 (×8): qty 2

## 2020-06-04 MED ORDER — SODIUM BICARBONATE 8.4 % IV SOLN
Freq: Once | INTRAVENOUS | Status: DC
  Filled 2020-06-04: qty 100

## 2020-06-04 MED ORDER — METOCLOPRAMIDE HCL 10 MG PO TABS
5.0000 mg | ORAL_TABLET | Freq: Two times a day (BID) | ORAL | Status: DC
  Administered 2020-06-04 – 2020-06-11 (×13): 5 mg via ORAL
  Filled 2020-06-04 (×15): qty 1

## 2020-06-04 MED ORDER — CYCLOBENZAPRINE HCL 10 MG PO TABS
10.0000 mg | ORAL_TABLET | Freq: Three times a day (TID) | ORAL | Status: DC | PRN
  Administered 2020-06-08 (×2): 10 mg via ORAL
  Filled 2020-06-04 (×2): qty 1

## 2020-06-04 MED ORDER — ACETAMINOPHEN 325 MG PO TABS
650.0000 mg | ORAL_TABLET | Freq: Four times a day (QID) | ORAL | Status: DC | PRN
  Administered 2020-06-04 – 2020-06-07 (×3): 650 mg via ORAL
  Filled 2020-06-04 (×3): qty 2

## 2020-06-04 MED ORDER — AZITHROMYCIN 500 MG PO TABS
500.0000 mg | ORAL_TABLET | Freq: Every day | ORAL | Status: AC
  Administered 2020-06-04 – 2020-06-08 (×5): 500 mg via ORAL
  Filled 2020-06-04: qty 1
  Filled 2020-06-04: qty 2
  Filled 2020-06-04 (×3): qty 1

## 2020-06-04 MED ORDER — IOHEXOL 350 MG/ML SOLN
100.0000 mL | Freq: Once | INTRAVENOUS | Status: AC | PRN
  Administered 2020-06-04: 75 mL via INTRAVENOUS

## 2020-06-04 MED ORDER — LORAZEPAM 0.5 MG PO TABS
0.5000 mg | ORAL_TABLET | Freq: Two times a day (BID) | ORAL | Status: DC | PRN
  Administered 2020-06-04 – 2020-06-05 (×3): 0.5 mg via ORAL
  Filled 2020-06-04 (×3): qty 1

## 2020-06-04 MED ORDER — CHLORHEXIDINE GLUCONATE CLOTH 2 % EX PADS
6.0000 | MEDICATED_PAD | Freq: Every day | CUTANEOUS | Status: DC
  Administered 2020-06-05 – 2020-06-14 (×10): 6 via TOPICAL

## 2020-06-04 MED ORDER — FUROSEMIDE 10 MG/ML IJ SOLN
40.0000 mg | Freq: Once | INTRAMUSCULAR | Status: AC
  Administered 2020-06-04: 40 mg via INTRAVENOUS
  Filled 2020-06-04: qty 4

## 2020-06-04 MED ORDER — PANTOPRAZOLE SODIUM 40 MG PO TBEC
40.0000 mg | DELAYED_RELEASE_TABLET | Freq: Two times a day (BID) | ORAL | Status: DC
  Administered 2020-06-04 – 2020-06-14 (×17): 40 mg via ORAL
  Filled 2020-06-04 (×19): qty 1

## 2020-06-04 MED ORDER — ACETAMINOPHEN 650 MG RE SUPP: 650.0000 mg | Freq: Four times a day (QID) | RECTAL | Status: DC | PRN

## 2020-06-04 MED ORDER — DEXTROSE 50 % IV SOLN
50.0000 mL | Freq: Once | INTRAVENOUS | Status: AC
  Administered 2020-06-04: 50 mL via INTRAVENOUS
  Filled 2020-06-04: qty 50

## 2020-06-04 MED ORDER — CARVEDILOL 12.5 MG PO TABS
12.5000 mg | ORAL_TABLET | Freq: Two times a day (BID) | ORAL | Status: DC
  Administered 2020-06-04 – 2020-06-09 (×11): 12.5 mg via ORAL
  Filled 2020-06-04 (×12): qty 1

## 2020-06-04 MED ORDER — SODIUM CHLORIDE 0.9 % IV BOLUS
500.0000 mL | Freq: Once | INTRAVENOUS | Status: AC
  Administered 2020-06-04: 500 mL via INTRAVENOUS

## 2020-06-04 MED ORDER — ONDANSETRON HCL 4 MG/2ML IJ SOLN
4.0000 mg | Freq: Once | INTRAMUSCULAR | Status: AC
  Administered 2020-06-04: 4 mg via INTRAVENOUS
  Filled 2020-06-04: qty 2

## 2020-06-04 MED ORDER — OXYCODONE HCL 5 MG PO TABS
5.0000 mg | ORAL_TABLET | Freq: Four times a day (QID) | ORAL | Status: DC | PRN
  Administered 2020-06-04 – 2020-06-07 (×7): 5 mg via ORAL
  Filled 2020-06-04 (×7): qty 1

## 2020-06-04 MED ORDER — LABETALOL HCL 5 MG/ML IV SOLN
20.0000 mg | INTRAVENOUS | Status: DC | PRN
  Administered 2020-06-04 – 2020-06-14 (×12): 20 mg via INTRAVENOUS
  Filled 2020-06-04 (×12): qty 4

## 2020-06-04 MED ORDER — FUROSEMIDE 10 MG/ML IJ SOLN
40.0000 mg | Freq: Two times a day (BID) | INTRAMUSCULAR | Status: DC
  Administered 2020-06-04 – 2020-06-06 (×5): 40 mg via INTRAVENOUS
  Filled 2020-06-04 (×5): qty 4

## 2020-06-04 MED ORDER — MORPHINE SULFATE (PF) 4 MG/ML IV SOLN
4.0000 mg | Freq: Once | INTRAVENOUS | Status: AC
Start: 2020-06-04 — End: 2020-06-04
  Administered 2020-06-04: 4 mg via INTRAVENOUS
  Filled 2020-06-04: qty 1

## 2020-06-04 MED ORDER — ACETAMINOPHEN 500 MG PO TABS
1000.0000 mg | ORAL_TABLET | Freq: Once | ORAL | Status: AC
  Administered 2020-06-04: 1000 mg via ORAL
  Filled 2020-06-04: qty 2

## 2020-06-04 MED ORDER — SODIUM CHLORIDE 0.9 % IV SOLN
2.0000 g | INTRAVENOUS | Status: AC
  Administered 2020-06-04 – 2020-06-08 (×5): 2 g via INTRAVENOUS
  Filled 2020-06-04 (×5): qty 20

## 2020-06-04 MED ORDER — SODIUM ZIRCONIUM CYCLOSILICATE 5 G PO PACK
5.0000 g | PACK | Freq: Three times a day (TID) | ORAL | Status: AC
  Administered 2020-06-04 – 2020-06-05 (×5): 5 g via ORAL
  Filled 2020-06-04 (×8): qty 1

## 2020-06-04 MED ORDER — ALBUTEROL SULFATE HFA 108 (90 BASE) MCG/ACT IN AERS
8.0000 | INHALATION_SPRAY | Freq: Once | RESPIRATORY_TRACT | Status: AC
  Administered 2020-06-04: 8 via RESPIRATORY_TRACT
  Filled 2020-06-04: qty 6.7

## 2020-06-04 MED ORDER — IOHEXOL 300 MG/ML  SOLN
75.0000 mL | Freq: Once | INTRAMUSCULAR | Status: AC | PRN
  Administered 2020-06-04: 75 mL via INTRAVENOUS

## 2020-06-04 MED ORDER — INSULIN ASPART 100 UNIT/ML IV SOLN
5.0000 [IU] | Freq: Once | INTRAVENOUS | Status: DC
  Filled 2020-06-04: qty 0.05

## 2020-06-04 MED ORDER — MORPHINE SULFATE (PF) 4 MG/ML IV SOLN
4.0000 mg | Freq: Once | INTRAVENOUS | Status: AC
  Administered 2020-06-04: 4 mg via INTRAVENOUS
  Filled 2020-06-04: qty 1

## 2020-06-04 MED ORDER — INSULIN ASPART 100 UNIT/ML ~~LOC~~ SOLN
0.0000 [IU] | Freq: Three times a day (TID) | SUBCUTANEOUS | Status: DC
  Administered 2020-06-04: 1 [IU] via SUBCUTANEOUS
  Administered 2020-06-05: 3 [IU] via SUBCUTANEOUS
  Administered 2020-06-06: 2 [IU] via SUBCUTANEOUS
  Administered 2020-06-07 – 2020-06-08 (×3): 1 [IU] via SUBCUTANEOUS
  Administered 2020-06-08: 3 [IU] via SUBCUTANEOUS
  Administered 2020-06-08: 2 [IU] via SUBCUTANEOUS
  Administered 2020-06-09: 3 [IU] via SUBCUTANEOUS
  Administered 2020-06-09: 2 [IU] via SUBCUTANEOUS
  Administered 2020-06-10: 1 [IU] via SUBCUTANEOUS
  Administered 2020-06-10: 3 [IU] via SUBCUTANEOUS
  Administered 2020-06-11 (×2): 2 [IU] via SUBCUTANEOUS
  Administered 2020-06-11 – 2020-06-12 (×2): 1 [IU] via SUBCUTANEOUS
  Filled 2020-06-04: qty 0.06

## 2020-06-04 MED ORDER — SODIUM ZIRCONIUM CYCLOSILICATE 10 G PO PACK
10.0000 g | PACK | Freq: Once | ORAL | Status: AC
  Administered 2020-06-04: 10 g via ORAL
  Filled 2020-06-04: qty 1

## 2020-06-04 MED ORDER — MORPHINE SULFATE (PF) 2 MG/ML IV SOLN
2.0000 mg | INTRAVENOUS | Status: DC | PRN
  Administered 2020-06-04 – 2020-06-06 (×7): 2 mg via INTRAVENOUS
  Filled 2020-06-04 (×7): qty 1

## 2020-06-04 MED ORDER — DEXTROSE 50 % IV SOLN
1.0000 | Freq: Once | INTRAVENOUS | Status: AC
  Administered 2020-06-04: 50 mL via INTRAVENOUS
  Filled 2020-06-04: qty 50

## 2020-06-04 MED ORDER — ENOXAPARIN SODIUM 40 MG/0.4ML ~~LOC~~ SOLN
40.0000 mg | Freq: Every day | SUBCUTANEOUS | Status: DC
  Administered 2020-06-04 – 2020-06-14 (×10): 40 mg via SUBCUTANEOUS
  Filled 2020-06-04 (×11): qty 0.4

## 2020-06-04 MED ORDER — BUSPIRONE HCL 5 MG PO TABS
5.0000 mg | ORAL_TABLET | Freq: Three times a day (TID) | ORAL | Status: DC
  Administered 2020-06-04 – 2020-06-14 (×23): 5 mg via ORAL
  Filled 2020-06-04 (×26): qty 1

## 2020-06-04 NOTE — Progress Notes (Signed)
Inpatient Diabetes Program Recommendations  AACE/ADA: New Consensus Statement on Inpatient Glycemic Control (2015)  Target Ranges:  Prepandial:   less than 140 mg/dL      Peak postprandial:   less than 180 mg/dL (1-2 hours)      Critically ill patients:  140 - 180 mg/dL   Lab Results  Component Value Date   GLUCAP 94 06/04/2020   HGBA1C 8.6 (H) 04/10/2020    Review of Glycemic Control  Diabetes history: DM1 Outpatient Diabetes medications: Lantus 10 units bid + Humalog 0-5 units tid Current orders for Inpatient glycemic control: Novolog 0-6 units  Inpatient Diabetes Program Recommendations:   Spoke with patient regarding diabetes management @ home. Patient states she has not noticed lows @ home but had not eaten yesterday pm prior to admission to the hospital. Patient is type 1 and will require basal insulin started after CBG improves. Consider: Lantus 5 bid and adjust as needed  Thank you, Bethena Roys E. Matalie Romberger, RN, MSN, CDE  Diabetes Coordinator Inpatient Glycemic Control Team Team Pager [phone] (8am-5pm) 06/04/2020 1:09 PM

## 2020-06-04 NOTE — ED Notes (Signed)
Pt bgl is 23- MD made aware, gave pt juice and snacks and awaiting further orders

## 2020-06-04 NOTE — ED Provider Notes (Signed)
[facility] DEPT Provider Note   CSN: [id number] Arrival date & time: 06/04/20  0304     History Chief Complaint  Patient presents with  . Toe Pain    [name] is a 30 y.o. 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 Patient reports increased pain to the right foot which is radiating up the entire right lower extremity, it is constant, severe, and worse with palpation. Has had increased drainage from her surgical wound with fever & chills. Also notes some mild chest discomfort, dyspnea, and productive cough.  Currently on daptomycin and Rocephin outpatient, states that recently Rocephin was stopped due to her liver function test being abnormal.  She denies nausea, vomiting, abdominal pain, or recent injury.  She states that the swelling in her bilateral lower legs has been waxing and waning since hospital discharge.Patient states she has been taking all abx as prescribed.   Patient with recent hospital admission 05/12/20-05/24/20 where she presented with increased right foot pain with ultimately admitted to [facility] for further care for osteomyelitis, received IV antibiotics, hospital course was complicated by respiratory failure presumed to be due to aspiration which required an ICU stay with intubation, ultimately was extubated, there was some concern for new onset CHF which was started on diuretics, and plan was for IV antibiotics with daptomycin and ceftriaxone through 06/13/2020 with wound care follow-up.  HPI     Past Medical History:  Diagnosis Date  . Allergy   . Anemia   . Anxiety   . Blood transfusion without reported diagnosis    Dec 2018  . Cataract    right eye  .  COVID-19 03/2020  . Depression   . Diabetes type 1, uncontrolled ([place]) 11/14/2011   Since age 46  . Fibromyalgia   . Gastroparesis   . GERD (gastroesophageal reflux disease)   . Hypertension   . Infection    UTI April 2016    Patient Active Problem List   Diagnosis Date Noted  . GI bleeding 05/12/2020  . Iron deficiency 04/11/2020  . Osteomyelitis ([place]) 04/10/2020  . Diabetic foot ulcer ([place]) 04/09/2020  . 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 Cannabinoid hyperemesis syndrome 04/30/2019  . Contraception management 08/06/2018  . Cyclical vomiting syndrome 06/07/2018  . Diabetic gastroparesis associated with type 1 diabetes mellitus ([place]) 06/04/2018  . Anxiety 05/31/2018  . Peripheral edema 05/31/2018  . Chronic anemia 07/10/2017  . GERD (gastroesophageal reflux disease) 06/23/2017  . Intractable nausea and vomiting 04/11/2017  . MDD (major depressive disorder), recurrent episode, mild ([place]) 02/28/2017  . Diabetes mellitus type 1 ([place]) 04/28/2013  . Severe protein-calorie malnutrition ([place]) 04/28/2013  . Hypertension associated with diabetes ([place]) 11/14/2011  . Chronic abdominal pain 09/18/2011    Past Surgical History:  Procedure Laterality Date  . AMPUTATION Right 04/13/2020   Procedure: 1ST RAY AMPUTATION RIGHT FOOT;  Surgeon: Newt Minion, MD;  Location: [facility];  Service: Orthopedics;  Laterality: Right;  . ANKLE SURGERY    . CHOLECYSTECTOMY  11/15/2011   Procedure: LAPAROSCOPIC CHOLECYSTECTOMY WITH INTRAOPERATIVE CHOLANGIOGRAM;  Surgeon: [name], MD;  Location: [facility];  Service: General;  Laterality: N/A;  . COLONOSCOPY    . COLONOSCOPY WITH PROPOFOL  N/A 06/27/2017   Procedure: COLONOSCOPY WITH PROPOFOL;  Surgeon: Milus Banister, MD;  Location: [facility];  Service: Endoscopy;  Laterality: N/A;  . ESOPHAGOGASTRODUODENOSCOPY  12/03/2011   Procedure: ESOPHAGOGASTRODUODENOSCOPY (EGD);  Surgeon: Beryle Beams, MD;  Location: [facility];   Service: Endoscopy;  Laterality: N/A;  . FLEXIBLE SIGMOIDOSCOPY N/A 03/10/2017   Procedure: FLEXIBLE SIGMOIDOSCOPY;  Surgeon: Carol Ada, MD;  Location: [facility];  Service: Endoscopy;  Laterality: N/A;  . INCISION AND DRAINAGE PERIRECTAL ABSCESS N/A 03/01/2017   Procedure: IRRIGATION AND DEBRIDEMENT PERIRECTAL ABSCESS;  Surgeon: Alphonsa Overall, MD;  Location: [facility];  Service: General;  Laterality: N/A;  . IRRIGATION AND DEBRIDEMENT BUTTOCKS N/A 03/23/2017   Procedure: IRRIGATION AND DEBRIDEMENT BUTTOCKS, SETON PLACEMENT;  Surgeon: [name], MD;  Location: [facility];  Service: General;  Laterality: N/A;  . LAPAROSCOPY  11/23/2011   Procedure: LAPAROSCOPY DIAGNOSTIC;  Surgeon: Edward Jolly, MD;  Location: [facility];  Service: General;  Laterality: N/A;  . SIGMOIDOSCOPY    . UPPER GASTROINTESTINAL ENDOSCOPY    . WISDOM TOOTH EXTRACTION       OB History    Gravida  1   Para  1   Term      Preterm  1   AB      Living  1     SAB      IAB      Ectopic      Multiple  0   Live Births  1           Family History  Problem Relation Age of Onset  . Diabetes Mother   . Hypertension Father   . Colon cancer Paternal Grandmother        pt thinks PGM was dx in her 33's  . Diabetes Paternal Grandmother   . Diabetes Maternal Grandmother   . Diabetes Maternal Grandfather   . Diabetes Paternal Grandfather   . Diabetes Other   . Breast cancer Paternal Aunt   . Esophageal cancer Neg Hx   . Liver cancer Neg Hx   . Pancreatic cancer Neg Hx   . Stomach cancer Neg Hx   . Rectal cancer Neg Hx     Social History   Tobacco Use  . Smoking status: Never Smoker  . Smokeless tobacco: Never Used  Vaping Use  . Vaping Use: Never used  Substance Use Topics  . Alcohol use: No    Alcohol/week: 0.0 standard drinks  . Drug use: Yes    Types: Marijuana    Home Medications Prior to Admission medications   Medication Sig Start Date End Date Taking? Authorizing Provider   acetaminophen (TYLENOL) 500 MG tablet Take 1 tablet (500 mg total) by mouth every 8 (eight) hours. 05/24/20 06/23/20  Mercy Riding, MD  aluminum-magnesium hydroxide-simethicone (MAALOX) 412-878-67 MG/5ML SUSP Take 30 mLs by mouth 3 (three) times daily as needed (indigestion).    [provider]  busPIRone (BUSPAR) 5 MG tablet Take 1 tablet (5 mg total) by mouth 3 (three) times daily. 02/03/20   [name], MD  carvedilol (COREG) 12.5 MG tablet Take 1 tablet (12.5 mg total) by mouth 2 (two) times daily with a meal. 04/16/20 07/15/20  Dahal, Marlowe Aschoff, MD  cefTRIAXone (ROCEPHIN) IVPB Inject 2 g into the vein daily for 21 days. Indication:  Wound infection/osteo right foot First Dose: Yes Last Day of Therapy:  06/13/20 Labs - Once weekly:  CBC/D and BMP, Labs - Every other week:  ESR and  CRP Method of administration: IV Push Method of administration may be changed at the discretion of home infusion pharmacist based upon assessment of the patient and/or caregiver's ability to self-administer the medication ordered. 05/24/20 06/14/20  Mercy Riding, MD  Continuous Blood Gluc Sensor (DEXCOM G6 SENSOR) MISC USE AS DIRECTED WITH CONTINUOUS BLOOD GLUCOSE MONITOR. REPLACE EVERY 10 DAYS 02/27/20   Renato Shin, MD  Continuous Blood Gluc Transmit (DEXCOM G6 TRANSMITTER) MISC USE AS DIRECTED TO  MONITOR  GLUCOSE  LEVELS 05/10/20   Renato Shin, MD  cyclobenzaprine (FLEXERIL) 10 MG tablet TAKE 1 TABLET(10 MG) BY MOUTH THREE TIMES DAILY AS NEEDED FOR MUSCLE SPASMS 04/02/20   [name], MD  daptomycin (CUBICIN) IVPB Inject 500 mg into the vein daily for 21 days. Indication:  Wound infection/osteo right foot  First Dose: Yes Last Day of Therapy:  06/13/20 Labs - Once weekly:  CBC/D, BMP, and CPK Labs - Every other week:  ESR and CRP Method of administration: IV Push Method of administration may be changed at the discretion of home infusion pharmacist based upon assessment of the patient and/or  caregiver's ability to self-administer the medication ordered. 05/24/20 06/14/20  Mercy Riding, MD  dicyclomine (BENTYL) 20 MG tablet Take 1 tablet (20 mg total) by mouth every 8 (eight) hours as needed for spasms. 11/25/19   Jemmie Rhinehart R, PA-C  furosemide (LASIX) 40 MG tablet Take 1 tablet (40 mg total) by mouth daily. 05/24/20 11/20/20  Mercy Riding, MD  gabapentin (NEURONTIN) 300 MG capsule Take 1 capsule (300 mg total) by mouth at bedtime. 04/20/20   [name], MD  Insulin NPH, Human,, Isophane, (HUMULIN N KWIKPEN) 100 UNIT/ML Kiwkpen Inject 10 Units into the skin 2 (two) times daily at 8 am and 10 pm. 05/24/20   Mercy Riding, MD  loperamide (IMODIUM) 2 MG capsule Take 6 mg by mouth daily as needed for diarrhea or loose stools.     [provider]  LORazepam (ATIVAN) 0.5 MG tablet TAKE 1 TABLET(0.5 MG) BY MOUTH TWICE DAILY AS NEEDED FOR ANXIETY Patient taking differently: Take 0.5 mg by mouth 2 (two) times daily as needed for anxiety. 03/07/20   [name], MD  metoCLOPramide (REGLAN) 5 MG tablet Take 1 tablet (5 mg total) by mouth in the morning and at bedtime. Please keep your January appt for further refills. Thank you 03/28/20   Armbruster, Carlota Raspberry, MD  mirtazapine (REMERON) 30 MG tablet TAKE 1 TABLET BY MOUTH AT  BEDTIME Patient taking differently: Take 30 mg by mouth at bedtime. 01/17/20   [name], MD  ondansetron (ZOFRAN ODT) 4 MG disintegrating tablet Take 1 tablet (4 mg total) by mouth every 8 (eight) hours as needed for nausea or vomiting. 04/06/20   Lucretia Kern, DO  oxyCODONE (ROXICODONE) 5 MG immediate release tablet Take 1 tablet (5 mg total) by mouth every 6 (six) hours as needed for severe pain. 05/31/20   Persons, Bevely Palmer, PA  pantoprazole (PROTONIX) 40 MG tablet Take 1 tablet (40 mg total) by mouth 2 (two) times daily. 12/06/19   Armbruster, Carlota Raspberry, MD  silver sulfADIAZINE (SILVADENE) 1 % cream Apply 1 application topically daily. Apply small  amount to foot wound daily cover with dry dressing 05/24/20   Persons, Bevely Palmer, PA  sucralfate (CARAFATE) 1 GM/10ML suspension Take 10 mLs (1 g total) by mouth every 6 (six) hours as needed. 11/24/19   Armbruster, Carlota Raspberry, MD  potassium  chloride SA (KLOR-CON) 20 MEQ tablet Take 1 tablet (20 mEq total) by mouth daily for 5 doses. 07/04/19 07/30/19  Janeece Fitting, PA-C    Allergies    Other and Lactose intolerance (gi)  Review of Systems   Review of Systems  Constitutional: Positive for chills and fever.  Respiratory: Positive for cough and shortness of breath.   Cardiovascular: Positive for chest pain and leg swelling.  Gastrointestinal: Negative for abdominal pain, diarrhea and vomiting.  Musculoskeletal: Positive for arthralgias and myalgias.  Skin: Positive for wound.  Neurological: Negative for syncope.  All other systems reviewed and are negative.   Physical Exam Updated Vital Signs BP (!) 160/92   Pulse 99   Temp 100.2 F (37.9 C) (Oral)   Resp 18   SpO2 91%   Physical Exam Vitals and nursing note reviewed.  Constitutional:      Appearance: She is well-developed. She is not toxic-appearing.     Comments: Hot to the touch.  Appears uncomfortable.  HENT:     Head: Normocephalic and atraumatic.  Eyes:     General:        Right eye: No discharge.        Left eye: No discharge.     Conjunctiva/sclera: Conjunctivae normal.  Cardiovascular:     Rate and Rhythm: Normal rate and regular rhythm.     Comments: 2+ symmetric DP pulses bilaterally. Pulmonary:     Effort: Pulmonary effort is normal. No respiratory distress.     Breath sounds: Normal breath sounds. No wheezing.     Comments: Coarse bibasilar breath sounds noted. Abdominal:     General: There is no distension.     Palpations: Abdomen is soft.     Tenderness: There is no abdominal tenderness. There is no guarding or rebound.  Musculoskeletal:     Cervical back: Neck supple.     Comments: Upper extremities: RUE  catheter in place, in significant surrounding erythema/warmth/drainage.  Lower extremities: Patient has 2-3+ pitting edema to the bilateral lower legs.  She has a surgical site wound to the medial aspect of the right foot with first ray amputation.  Increased warmth around surgical site.  There is some mild yellow drainage noted.  There is a ulceration to the plantar medial foot as well.  The foot is generally tender to palpation.  No other focal areas of tenderness to palpation.  Skin:    General: Skin is warm and dry.     Findings: No rash.  Neurological:     Mental Status: She is alert.     Comments: Clear speech.   Psychiatric:        Behavior: Behavior normal.          ED Results / Procedures / Treatments   Labs (all labs ordered are listed, but only abnormal results are displayed) Labs Reviewed  CBC WITH DIFFERENTIAL/PLATELET - Abnormal; Notable for the following components:      Result Value   RBC 2.75 (*)    Hemoglobin 7.9 (*)    HCT 26.6 (*)    MCHC 29.7 (*)    RDW 15.9 (*)    Monocytes Absolute 1.3 (*)    All other components within normal limits  BRAIN NATRIURETIC PEPTIDE - Abnormal; Notable for the following components:   B Natriuretic Peptide 444.7 (*)    All other components within normal limits  COMPREHENSIVE METABOLIC PANEL - Abnormal; Notable for the following components:   Potassium 7.0 (*)    BUN  33 (*)    Creatinine, Ser 1.30 (*)    Calcium 8.3 (*)    Albumin 2.0 (*)    ALT 58 (*)    Alkaline Phosphatase 253 (*)    GFR, Estimated 57 (*)    Anion gap 4 (*)    All other components within normal limits  URINE CULTURE  CULTURE, BLOOD (ROUTINE X 2)  CULTURE, BLOOD (ROUTINE X 2)  RESP PANEL BY RT-PCR (FLU A&B, COVID) ARPGX2  LACTIC ACID, PLASMA  PROTIME-INR  APTT  LACTIC ACID, PLASMA  URINALYSIS, ROUTINE W REFLEX MICROSCOPIC  I-STAT BETA HCG BLOOD, ED ([facility], [facility], [facility] ONLY)  CBG MONITORING, ED  TROPONIN I (HIGH SENSITIVITY)  TROPONIN I (HIGH  SENSITIVITY)    EKG EKG Interpretation  Date/Time:  Monday June 04 2020 04:29:04 EST Ventricular Rate:  92 PR Interval:    QRS Duration: 80 QT Interval:  318 QTC Calculation: 394 R Axis:   41 Text Interpretation: Sinus rhythm no ischemic changes compared to previous Confirmed by Theotis Burrow [phone]) on 06/04/2020 4:45:50 AM   Radiology DG Chest Port 1 View  Result Date: 06/04/2020 CLINICAL DATA:  Sepsis EXAM: PORTABLE CHEST 1 VIEW COMPARISON:  05/21/2020 FINDINGS: Right upper extremity PICC line tip within the superior vena cava. Diffuse asymmetric airspace infiltrate has improved, particularly within the right upper lobe, though persists in keeping with multifocal pneumonia. No pneumothorax or pleural effusion. Interval development of right lower lobe collapse. Cardiac size is mildly enlarged, unchanged. No acute bone abnormality. IMPRESSION: Interval development of right lower lobe collapse. Improving multifocal pulmonary infiltrates, most in keeping with multifocal pneumonia. Stable cardiomegaly Electronically Signed   By: Fidela Salisbury MD   On: 06/04/2020 04:19   DG Foot Complete Right  Result Date: 06/04/2020 CLINICAL DATA:  Sepsis EXAM: RIGHT FOOT COMPLETE - 3+ VIEW COMPARISON:  None. FINDINGS: Three view radiograph of the right foot demonstrates surgical changes of first ray amputation distal to the a medial cuneiform. There is extensive soft tissue swelling of the right forefoot as well as a large open defect involving the medial aspect of the right forefoot in keeping with dehiscence of the surgical wound. Additionally, there has developed a fracture of the base of the second metatarsal with lateral subluxation of the second and likely the third and fourth metatarsal shafts in keeping with a Lisfranc fracture dislocation. ORIF of the medial malleolus utilizing a single partially threaded screw and distal fibular ORIF utilizing lateral plate and screws are partially visualized.  Advanced vascular calcifications are seen within the soft tissues. IMPRESSION: Open wound involving the site of right first ray amputation with extensive soft tissue swelling of the right forefoot in keeping with dehiscence of the surgical wound and probable superimposed cellulitis. Interval development of a Lisfranc fracture dislocation with mild lateral subluxation of the second, third, and fourth metatarsals. This may be better assessed with CT imaging. Electronically Signed   By: Fidela Salisbury MD   On: 06/04/2020 04:26    Procedures .Critical Care Performed by: Amaryllis Dyke, PA-C Authorized by: Amaryllis Dyke, PA-C    CRITICAL CARE Performed by: Kennith Maes   Total critical care time: 45 minutes  Critical care time was exclusive of separately billable procedures and treating other patients.  Critical care was necessary to treat or prevent imminent or life-threatening deterioration.  Critical care was time spent personally by me on the following activities: development of treatment plan with patient and/or surrogate as well as nursing, discussions with consultants,  evaluation of patient's response to treatment, examination of patient, obtaining history from patient or surrogate, ordering and performing treatments and interventions, ordering and review of laboratory studies, ordering and review of radiographic studies, pulse oximetry and re-evaluation of patient's condition.    Medications Ordered in ED Medications  sodium bicarbonate 100 mEq in dextrose 5 % 1,000 mL infusion (has no administration in time range)  sodium zirconium cyclosilicate (LOKELMA) packet 10 g (has no administration in time range)  acetaminophen (TYLENOL) tablet 1,000 mg (has no administration in time range)  albuterol (VENTOLIN HFA) 108 (90 Base) MCG/ACT inhaler 8 puff (has no administration in time range)  insulin aspart (novoLOG) injection 5 Units (has no administration in time range)     And  dextrose 50 % solution 50 mL (has no administration in time range)  morphine 4 MG/ML injection 4 mg (4 mg Intravenous Given 06/04/20 0413)  ondansetron (ZOFRAN) injection 4 mg (4 mg Intravenous Given 06/04/20 0413)  iohexol (OMNIPAQUE) 350 MG/ML injection 100 mL (75 mLs Intravenous Contrast Given 06/04/20 0654)  iohexol (OMNIPAQUE) 300 MG/ML solution 75 mL (75 mLs Intravenous Contrast Given 06/04/20 0654)    ED Course  I have reviewed the triage vital signs and the nursing notes.  Pertinent labs & imaging results that were available during my care of the patient were reviewed by me and considered in my medical decision making (see chart for details).    MDM Rules/Calculators/A&P                         Patient presents to the ED with complaints of increased right foot pain, also has had continued respiratory symptoms, intermittent chest pain, as well as waxing and waning lower extremity swelling.  She appears uncomfortable.  Vitals notable for fever to 102.8 orally, BP somewhat elevated.  Additional history obtained:  Additional history obtained from chart review & nursing note review.   Lab Tests:  I Ordered, reviewed, and interpreted labs, which included:  CBG: 83 CBC: Anemia similar to prior, no leukocytosis. CMP: Critical hyperkalemia at 7.0 without hemolysis.  Mildly elevated ALT and alkaline phosphatase.  Elevation in creatinine and BUN-slightly increased compared to prior. Lactic acid: Within normal limits PT/INR/APTT: Within normal limits Troponin: Within normal limits BNP: Elevated but improved from most recent on record.  Pregnancy test: Negative  Imaging Studies ordered:  I ordered imaging studies which included CXR & right foot x-ray, I independently reviewed, formal radiology impression shows:   CXR: Interval development of right lower lobe collapse. Improving multifocal pulmonary infiltrates, most in keeping with multifocal pneumonia.  L foot x-ray: Open wound  involving the site of right first ray amputation with extensive soft tissue swelling of the right forefoot in keeping with dehiscence of the surgical wound and probable superimposed cellulitis. Interval development of a Lisfranc fracture dislocation with mild lateral subluxation of the second, third, and fourth metatarsals. This may be better assessed with CT imaging.  Based on x-ray findings CTA chest & CT right foot ordered.   Critical hyperkalemia at 7.0, upon review of EKG T waves do look slightly more peaked compared to prior.  We have ordered Lokelma, albuterol, insulin/dextrose, and bicarb per discussion w/ attending.   07:10: Patient care signed out to Arlean Hopping PA-C at change of shift pending CT imaging & admission.   Patient ultimately will require admission following CT imaging.  Holding off on antibiotics currently as feel that infectious disease input would be beneficial given  complex history- consult placed by oncoming provider.   Findings and plan of care discussed with supervising physician Dr. Rex Kras who has provided guidance & is in agreement.   Portions of this note were generated with [employer]. Dictation errors may occur despite best attempts at proofreading.  Final Clinical Impression(s) / ED Diagnoses Final diagnoses:  Hyperkalemia  Wound infection    Rx / DC Orders ED Discharge Orders    None       Amaryllis Dyke, PA-C 06/04/20 0720    Little, Wenda Overland, MD 06/05/20 [phone]

## 2020-06-04 NOTE — ED Provider Notes (Signed)
[name] is a 30 y.o. female, presenting to the ED primarily complaining of increased right foot pain.  She also noted gray drainage over the last couple of days.  When she was removed from Rocephin on March 4, this was replaced with Cipro. She has had shortness of breath and the cough continued from her most recent admission where she was found to have pneumonia, however, she denies any increase in shortness of breath.  HPI from [place], PA-C: "[name] is a 31 y.o. 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 Patient reports increased pain to the right foot which is radiating up the entire right lower extremity, it is constant, severe, and worse with palpation. Has had increased drainage from her surgical wound with fever & chills. Also notes some mild chest discomfort, dyspnea, and productive cough.  Currently on daptomycin and Rocephin outpatient, states that recently Rocephin was stopped due to her liver function test being abnormal.  She denies nausea, vomiting, abdominal pain, or recent injury.  She states that the swelling in her bilateral lower legs has been waxing and waning since hospital discharge.Patient states she has been taking all abx as prescribed.   Patient with recent hospital admission 05/12/20-05/24/20 where she presented with increased right foot pain with ultimately admitted to [facility] for further care for osteomyelitis, received IV antibiotics, hospital course was complicated by respiratory failure presumed to be due to aspiration which required an ICU stay with intubation, ultimately was extubated, there was some concern for new onset CHF which was started on diuretics, and plan was for IV  antibiotics with daptomycin and ceftriaxone through 06/13/2020 with wound care follow-up."  Past Medical History:  Diagnosis Date  . Allergy   . Anemia   . Anxiety   . Blood transfusion without reported diagnosis    Dec 2018  . Cataract    right eye  . COVID-19 03/2020  . Depression   . Diabetes type 1, uncontrolled (HCC) 11/14/2011   Since age 29  . Fibromyalgia   . Gastroparesis   . GERD (gastroesophageal reflux disease)   . Hypertension   . Infection    UTI April 2016     Physical Exam  BP (!) 168/90   Pulse 89   Temp (!) 102.8 F (39.3 C) (Oral)   Resp 17   SpO2 94%   Physical Exam Vitals and nursing note reviewed.  Constitutional:      General: She is not in acute distress.    Appearance: She is well-developed. She is ill-appearing. She is not diaphoretic.  HENT:     Head: Normocephalic and atraumatic.     Mouth/Throat:     Mouth: Mucous membranes are dry.     Pharynx: Oropharynx is clear.  Eyes:     Conjunctiva/sclera: Conjunctivae normal.  Cardiovascular:     Rate and Rhythm: Normal rate and regular rhythm.     Pulses: Normal pulses.          Radial pulses are 2+ on the right side and 2+ on the left side.       Dorsalis pedis pulses are 2+ on the right side and 2+ on the left side.     Heart sounds: Normal heart sounds.  Comments: Tactile temperature in the extremities appropriate and equal bilaterally. Pulmonary:     Breath sounds: Rhonchi and rales present.     Comments: Reportedly SPO2 in the 80s upon initial assessment, maintaining 97% on 2 L supplemental O2. Abdominal:     Palpations: Abdomen is soft.     Tenderness: There is no abdominal tenderness. There is no guarding.  Musculoskeletal:     Cervical back: Neck supple.     Right lower leg: 2+ Pitting Edema present.     Left lower leg: 2+ Pitting Edema present.  Skin:    General: Skin is warm and dry.  Neurological:     Mental Status: She is alert.  Psychiatric:        Mood and Affect:  Mood and affect normal.        Speech: Speech normal.        Behavior: Behavior normal.           ED Course/Procedures     .Critical Care Performed by: Anselm Pancoast, PA-C Authorized by: Anselm Pancoast, PA-C   Critical care provider statement:    Critical care time (minutes):  60   Critical care time was exclusive of:  Separately billable procedures and treating other patients   Critical care was necessary to treat or prevent imminent or life-threatening deterioration of the following conditions:  Respiratory failure and sepsis   Critical care was time spent personally by me on the following activities:  Ordering and performing treatments and interventions, ordering and review of laboratory studies, ordering and review of radiographic studies, development of treatment plan with patient or surrogate, re-evaluation of patient's condition, review of old charts, obtaining history from patient or surrogate, examination of patient, evaluation of patient's response to treatment and discussions with consultants   I assumed direction of critical care for this patient from another provider in my specialty: yes     Care discussed with: admitting provider       Abnormal Labs Reviewed  CBC WITH DIFFERENTIAL/PLATELET - Abnormal; Notable for the following components:      Result Value   RBC 2.75 (*)    Hemoglobin 7.9 (*)    HCT 26.6 (*)    MCHC 29.7 (*)    RDW 15.9 (*)    Monocytes Absolute 1.3 (*)    All other components within normal limits  BRAIN NATRIURETIC PEPTIDE - Abnormal; Notable for the following components:   B Natriuretic Peptide 444.7 (*)    All other components within normal limits  COMPREHENSIVE METABOLIC PANEL - Abnormal; Notable for the following components:   Potassium 7.0 (*)    BUN 33 (*)    Creatinine, Ser 1.30 (*)    Calcium 8.3 (*)    Albumin 2.0 (*)    ALT 58 (*)    Alkaline Phosphatase 253 (*)    GFR, Estimated 57 (*)    Anion gap 4 (*)    All other  components within normal limits    Hemoglobin  Date Value Ref Range Status  06/04/2020 7.9 (L) 12.0 - 15.0 g/dL Final  07/86/7544 7.8 (L) 12.0 - 15.0 g/dL Final  92/03/69 8.6 (L) 12.0 - 15.0 g/dL Final  21/97/5883 8.9 (L) 12.0 - 15.0 g/dL Final   ALT  Date Value Ref Range Status  06/04/2020 58 (H) 0 - 44 U/L Final  05/16/2020 44 0 - 44 U/L Final  05/14/2020 77 (H) 0 - 44 U/L Final  05/13/2020 103 (H) 0 - 44 U/L  Final    AST  Date Value Ref Range Status  06/04/2020 34 15 - 41 U/L Final  05/16/2020 37 15 - 41 U/L Final  05/14/2020 78 (H) 15 - 41 U/L Final  05/13/2020 162 (H) 15 - 41 U/L Final   CT Angio Chest PE W/Cm &/Or Wo Cm  Result Date: 06/04/2020 CLINICAL DATA:  Chest pain. Shortness of breath. Pleurisy versus pleural effusion. EXAM: CT ANGIOGRAPHY CHEST WITH CONTRAST TECHNIQUE: Multidetector CT imaging of the chest was performed using the standard protocol during bolus administration of intravenous contrast. Multiplanar CT image reconstructions and MIPs were obtained to evaluate the vascular anatomy. CONTRAST:  51mL OMNIPAQUE IOHEXOL 300 MG/ML SOLN, 31mL OMNIPAQUE IOHEXOL 350 MG/ML SOLN COMPARISON:  Chest radiograph-06/04/2020; 05/21/2020; 05/18/2020 FINDINGS: Vascular Findings: There is adequate opacification of the pulmonary arterial system with the main pulmonary artery measuring 248 Hounsfield units. There are no discrete filling defects within the pulmonary arterial tree to the level of the bilateral subsegmental pulmonary arteries. Evaluation of the distal subsegmental pulmonary arteries is degraded secondary to patient respiratory artifact. Normal caliber of the main pulmonary artery. Borderline cardiomegaly.  No pericardial effusion. No evidence of thoracic aortic aneurysm or dissection. Bovine configuration of the aortic arch. The branch vessels of the aortic arch appear widely patent throughout their imaged courses. The descending thoracic aorta is of normal caliber and  widely patent without hemodynamically significant narrowing. Right upper extremity PICC line tip terminates within the superior cavoatrial junction. Review of the MIP images confirms the above findings. ---------------------------------------------------------------------------------- Nonvascular Findings: Mediastinum/Lymph Nodes: Mediastinal, bilateral hilar and axillary lymph nodes are prominent though individually not enlarged by size criteria with index left axillary lymph node measuring 0.8 cm in greatest short axis diameter (image 21, series 4) and index right axillary lymph node measuring 0.6 cm (image 45), presumably reactive in etiology. Lungs/Pleura: Small to moderate sized right-sided pleural effusion with trace left-sided pleural effusion. Extensive bilateral slightly nodular heterogeneous and airspace opacities with diffuse interlobular septal thickening. Bibasilar subpleural consolidative opacities. Relative areas of confluence are seen within the superior segment of the left lower lobe (image 50, series 6), with associated air bronchograms. The central pulmonary airways appear patent.  No pneumothorax. Upper abdomen: Limited early arterial phase evaluation of the upper abdomen is normal. Musculoskeletal: Diffuse body wall anasarca. No acute or aggressive osseous abnormalities. There is a punctate bone island within the T6 vertebral body. IMPRESSION: 1. No evidence of pulmonary embolism. 2. Small to moderate-sized right-sided pleural effusion and trace left-sided pleural effusion. 3. Extensive bilateral airspace opacities and interstitial thickening, nonspecific with broad differential considerations including multi lobar infection (including atypical etiology) and pulmonary edema. 4. Borderline cardiomegaly. 5. Diffuse body wall anasarca. 6. Prominent though non pathologically enlarged mediastinal, bilateral hilar and axillary lymph nodes, nonspecific though presumably reactive in etiology.  Electronically Signed   By: Simonne Come M.D.   On: 06/04/2020 07:50   CT FOOT RIGHT W CONTRAST  Result Date: 06/04/2020 CLINICAL DATA:  30 year old female with diabetes and foot swelling EXAM: CT OF THE LOWER RIGHT EXTREMITY WITH CONTRAST TECHNIQUE: Multidetector CT imaging of the lower right extremity was performed according to the standard protocol following intravenous contrast administration. CONTRAST:  83mL OMNIPAQUE IOHEXOL 300 MG/ML SOLN, 23mL OMNIPAQUE IOHEXOL 350 MG/ML SOLN COMPARISON:  X-ray 06/04/2020, MRI 04/10/2020, 04/30/2020 FINDINGS: Surgical wound at the medial aspect of the forefoot related to recent first ray resection. Erosive changes within the distal aspect of the medial cuneiform is highly suspicious for osteomyelitis (series  3, images 45-48). Redemonstration of Lisfranc fracture dislocation with dorsal and lateral dislocation of the second metatarsal base at the second TMT joint. There is also dorsal subluxation of the third metatarsal base at the third TMT joint. Numerous small fractures at the TMT joints including within the second and third metatarsal bases as well as at the medial and intermediate cuneiform bones. There is also irregularity at the dorsal aspect of the distal cuboid. Scattered areas of heterotopic bone formation at the TMT joints. Bones are demineralized. Partially visualized hardware at the medial malleolus and distal fibula. Soft tissue thickening at the medial aspect of the forefoot. No organized or rim enhancing fluid collection. Denervation changes of the intrinsic foot musculature. No significant tenosynovial fluid. IMPRESSION: 1. Surgical wound at the medial aspect of the forefoot related to recent first ray resection. Erosive changes within the distal aspect of the medial cuneiform is highly suspicious for osteomyelitis. 2. Redemonstration of Lisfranc fracture-dislocation with dorsal and lateral dislocation of the second metatarsal base at the second TMT joint.  There is also dorsal subluxation of the third metatarsal base at the third TMT joint. 3. Numerous fractures at the TMT joints including within the second and third metatarsal bases as well as at the medial and intermediate cuneiform bones, and distal cuboid. 4. No organized or rim enhancing fluid collection. Electronically Signed   By: Duanne GuessNicholas  Plundo D.O.   On: 06/04/2020 08:22   DG Chest Port 1 View  Result Date: 06/04/2020 CLINICAL DATA:  Sepsis EXAM: PORTABLE CHEST 1 VIEW COMPARISON:  05/21/2020 FINDINGS: Right upper extremity PICC line tip within the superior vena cava. Diffuse asymmetric airspace infiltrate has improved, particularly within the right upper lobe, though persists in keeping with multifocal pneumonia. No pneumothorax or pleural effusion. Interval development of right lower lobe collapse. Cardiac size is mildly enlarged, unchanged. No acute bone abnormality. IMPRESSION: Interval development of right lower lobe collapse. Improving multifocal pulmonary infiltrates, most in keeping with multifocal pneumonia. Stable cardiomegaly Electronically Signed   By: Helyn NumbersAshesh  Parikh MD   On: 06/04/2020 04:19   DG Foot Complete Right  Result Date: 06/04/2020 CLINICAL DATA:  Sepsis EXAM: RIGHT FOOT COMPLETE - 3+ VIEW COMPARISON:  None. FINDINGS: Three view radiograph of the right foot demonstrates surgical changes of first ray amputation distal to the a medial cuneiform. There is extensive soft tissue swelling of the right forefoot as well as a large open defect involving the medial aspect of the right forefoot in keeping with dehiscence of the surgical wound. Additionally, there has developed a fracture of the base of the second metatarsal with lateral subluxation of the second and likely the third and fourth metatarsal shafts in keeping with a Lisfranc fracture dislocation. ORIF of the medial malleolus utilizing a single partially threaded screw and distal fibular ORIF utilizing lateral plate and screws  are partially visualized. Advanced vascular calcifications are seen within the soft tissues. IMPRESSION: Open wound involving the site of right first ray amputation with extensive soft tissue swelling of the right forefoot in keeping with dehiscence of the surgical wound and probable superimposed cellulitis. Interval development of a Lisfranc fracture dislocation with mild lateral subluxation of the second, third, and fourth metatarsals. This may be better assessed with CT imaging. Electronically Signed   By: Helyn NumbersAshesh  Parikh MD   On: 06/04/2020 04:26     EKG Interpretation  Date/Time:  Monday June 04 2020 04:29:04 EST Ventricular Rate:  92 PR Interval:    QRS Duration: 80 QT Interval:  318 QTC Calculation: 394 R Axis:   41 Text Interpretation: Sinus rhythm no ischemic changes compared to previous Confirmed by Frederick Peers [phone]) on 06/04/2020 4:45:50 AM       MDM   Clinical Course as of 06/04/20 0945  Mon Jun 04, 2020  0723 RN reports CBG 41.  Requested she administer the previously ordered D50.  Hold insulin and bicarb drip. [SJ]  0740 Spoke with Dr. Luciana Axe, Infectious Disease. Hold on ABX at this time until directed otherwise by orthopedic team.  He will follow along on the patient. [SJ]  0830 No increased work of breathing.  No increased shortness of breath.  She does seem to have rales versus rhonchi in all lung fields.  Slow IV fluid infusion was stopped at this point after 150 cc of normal saline was infused. [SJ]  [id number] Spoke with Dr. Roda Shutters, orthopedic surgeon. Gives me Dr. Audrie Lia information for me to call him directly. [SJ]  3329 Sjpoke with Dr. Lajoyce Corners, orthopedic surgeon.  We discussed the patient's presentation, her recent history, recent changes, as well as imaging studies.  Request we admit to [facility] with initial plan for surgery on Wednesday, March 9. Hold on antibiotics for now. [SJ]  [id number] Spoke with Dr. Ronaldo Miyamoto, hospitalist. Agrees to admit the patient to [facility].  [SJ]   [phone] B Natriuretic Peptide(!): 444.7 Previously 757 on 05/16/20. [SJ]    Clinical Course User Index [SJ] Anselm Pancoast, PA-C    Patient care handoff report received from [facility], PA-C. Plan: Awaiting CT imaging results of the chest and foot.  Patient is ill-appearing with suspected worsening in right foot infection causing increased pain as well as fever.  Febrile, but no leukocytosis, tachycardia, hypotension.  No lactic acidosis.  She does not fit criteria for sepsis at this time.   This patient was quite difficult and there were conflicting findings that did not fit with definite diagnoses with the information available, especially in reference to her respiratory abnormalities. Leading diagnoses for patient's respiratory abnormalities are continued/worsened multifocal pneumonia versus worsened pulmonary edema due to CHF. Patient does have a fever, however, she states she has not worsened in her respiratory presentation since her discharge from the hospital where she was being treated for pneumonia. She reports being compliant with her antibiotics and when she did begin to feel worse, it was with her right foot pain and drainage that accompanied her fever rather than worsening respiratory status.  As far as the possibility for worsening pulmonary edema, she is hypertensive, which favors this diagnosis. She does have pitting lower extremity edema as well. However, she was able to tolerate lying supine for CT. Additionally, her BNP is elevated, but less so than on her previous admission.   Patient was noted to have hyperkalemia.  This was addressed as actively as possible, however, some of this therapy was interrupted with episodes of hypoglycemia.   Findings and plan of care discussed with attending physician, Jacalyn Lefevre, MD. Dr. Particia Nearing personally evaluated and examined this patient.   Vitals:   06/04/20 0900 06/04/20 0915 06/04/20 0918 06/04/20 0930  BP: (!) 161/93 (!)  165/101  (!) 162/96  Pulse: 80   80  Resp: 16 16  18   Temp:   99 F (37.2 C)   TempSrc:   Oral   SpO2: 97%   96%         06/04/20 1229    08/04/20, MD 06/05/20 [phone]

## 2020-06-04 NOTE — ED Notes (Signed)
Brought pt food trey

## 2020-06-04 NOTE — H&P (Addendum)
History and Physical    [name] BOF:[id number] DOB: 1990/05/23 DOA: 06/04/2020  PCP: [name], MD  Patient coming from: Home  Chief Complaint: Right foot pain  HPI: [name] is a 30 y.o. female with medical history significant of DM, DM neuropathy, Osteomyelitis, CKD3. Presenting with worsening right foot pain. The patient had a diabetic foot wound. She underwent right great toe amputation in 03/2020. Since that time she has been on IV abx. She reports that her pain never truly went away during that. It has worsened over the last several days. It's intermittent throbbing w/ increased drainage and swelling from that wound. She has tried APAP and oxycodone to help the pain, but they have not provided relief. When the pain continued through this morning, she became concerned and came to the ED. She denies any other alleviating or aggravating factors.    ED Course: CT obtained was concerning for osteo. EDP spoke with ortho and ID. Recommended holding abx and transferring to [facility] for possible procedure Wednesday. She had some respiratory difficulty when she first arrived. CTA chest was obtained that did not show clot, but did show MF PNA vs pulm edema. She was also noted to be febrile. She was given lasix. TRH was called for admission.   Review of Systems: Denies CP, palpitation, dyspnea, syncopal episodes, N/V. Review of systems is otherwise negative for all not mentioned in HPI.   PMHx Past Medical History:  Diagnosis Date  . Allergy   . Anemia   . Anxiety   . Blood transfusion without reported diagnosis    Dec 2018  . Cataract    right eye  . COVID-19 03/2020  . Depression   . Diabetes type 1, uncontrolled ([place]) 11/14/2011   Since age 30  . Fibromyalgia   . Gastroparesis   . GERD (gastroesophageal reflux disease)   . Hypertension   . Infection    UTI April 2016    PSHx Past Surgical History:  Procedure Laterality Date  . AMPUTATION Right 04/13/2020   Procedure:  1ST RAY AMPUTATION RIGHT FOOT;  Surgeon: Newt Minion, MD;  Location: [place];  Service: Orthopedics;  Laterality: Right;  . ANKLE SURGERY    . CHOLECYSTECTOMY  11/15/2011   Procedure: LAPAROSCOPIC CHOLECYSTECTOMY WITH INTRAOPERATIVE CHOLANGIOGRAM;  Surgeon: [name], MD;  Location: [facility];  Service: General;  Laterality: N/A;  . COLONOSCOPY    . COLONOSCOPY WITH PROPOFOL N/A 06/27/2017   Procedure: COLONOSCOPY WITH PROPOFOL;  Surgeon: Milus Banister, MD;  Location: [facility];  Service: Endoscopy;  Laterality: N/A;  . ESOPHAGOGASTRODUODENOSCOPY  12/03/2011   Procedure: ESOPHAGOGASTRODUODENOSCOPY (EGD);  Surgeon: Beryle Beams, MD;  Location: [facility];  Service: Endoscopy;  Laterality: N/A;  . FLEXIBLE SIGMOIDOSCOPY N/A 03/10/2017   Procedure: FLEXIBLE SIGMOIDOSCOPY;  Surgeon: Carol Ada, MD;  Location: [facility];  Service: Endoscopy;  Laterality: N/A;  . INCISION AND DRAINAGE PERIRECTAL ABSCESS N/A 03/01/2017   Procedure: IRRIGATION AND DEBRIDEMENT PERIRECTAL ABSCESS;  Surgeon: Alphonsa Overall, MD;  Location: [facility];  Service: General;  Laterality: N/A;  . IRRIGATION AND DEBRIDEMENT BUTTOCKS N/A 03/23/2017   Procedure: IRRIGATION AND DEBRIDEMENT BUTTOCKS, SETON PLACEMENT;  Surgeon: [name], MD;  Location: [facility];  Service: General;  Laterality: N/A;  . LAPAROSCOPY  11/23/2011   Procedure: LAPAROSCOPY DIAGNOSTIC;  Surgeon: Edward Jolly, MD;  Location: [facility];  Service: General;  Laterality: N/A;  . SIGMOIDOSCOPY    . UPPER GASTROINTESTINAL ENDOSCOPY    .  WISDOM TOOTH EXTRACTION      SocHx  reports that she has never smoked. She has never used smokeless tobacco. She reports current drug use. Drug: Marijuana. She reports that she does not drink alcohol.  Allergies  Allergen Reactions  . Other Anaphylaxis    Reaction to [place] nuts   . Lactose Intolerance (Gi) Diarrhea    FamHx Family History  Problem Relation Age of Onset  . Diabetes Mother   .  Hypertension Father   . Colon cancer Paternal Grandmother        pt thinks PGM was dx in her 5's  . Diabetes Paternal Grandmother   . Diabetes Maternal Grandmother   . Diabetes Maternal Grandfather   . Diabetes Paternal Grandfather   . Diabetes Other   . Breast cancer Paternal Aunt   . Esophageal cancer Neg Hx   . Liver cancer Neg Hx   . Pancreatic cancer Neg Hx   . Stomach cancer Neg Hx   . Rectal cancer Neg Hx     Prior to Admission medications   Medication Sig Start Date End Date Taking? Authorizing Provider  acetaminophen (TYLENOL) 500 MG tablet Take 1 tablet (500 mg total) by mouth every 8 (eight) hours. 05/24/20 06/23/20 Yes Mercy Riding, MD  busPIRone (BUSPAR) 5 MG tablet Take 1 tablet (5 mg total) by mouth 3 (three) times daily. 02/03/20  Yes [name], MD  carvedilol (COREG) 12.5 MG tablet Take 1 tablet (12.5 mg total) by mouth 2 (two) times daily with a meal. 04/16/20 07/15/20 Yes Dahal, Marlowe Aschoff, MD  cefTRIAXone (ROCEPHIN) IVPB Inject 2 g into the vein daily for 21 days. Indication:  Wound infection/osteo right foot First Dose: Yes Last Day of Therapy:  06/13/20 Labs - Once weekly:  CBC/D and BMP, Labs - Every other week:  ESR and CRP Method of administration: IV Push Method of administration may be changed at the discretion of home infusion pharmacist based upon assessment of the patient and/or caregiver's ability to self-administer the medication ordered. 05/24/20 06/14/20 Yes Mercy Riding, MD  cyclobenzaprine (FLEXERIL) 10 MG tablet TAKE 1 TABLET(10 MG) BY MOUTH THREE TIMES DAILY AS NEEDED FOR MUSCLE SPASMS Patient taking differently: Take 10 mg by mouth 3 (three) times daily as needed for muscle spasms. 04/02/20  Yes [name], MD  daptomycin (CUBICIN) IVPB Inject 500 mg into the vein daily for 21 days. Indication:  Wound infection/osteo right foot  First Dose: Yes Last Day of Therapy:  06/13/20 Labs - Once weekly:  CBC/D, BMP, and CPK Labs - Every other week:  ESR  and CRP Method of administration: IV Push Method of administration may be changed at the discretion of home infusion pharmacist based upon assessment of the patient and/or caregiver's ability to self-administer the medication ordered. 05/24/20 06/14/20 Yes Mercy Riding, MD  dicyclomine (BENTYL) 20 MG tablet Take 1 tablet (20 mg total) by mouth every 8 (eight) hours as needed for spasms. 11/25/19  Yes Petrucelli, Samantha R, PA-C  furosemide (LASIX) 40 MG tablet Take 1 tablet (40 mg total) by mouth daily. 05/24/20 11/20/20 Yes Mercy Riding, MD  insulin glargine (LANTUS) 100 UNIT/ML injection Inject 10 Units into the skin 2 (two) times daily. Morning & bedtime   Yes [provider]  insulin lispro (HUMALOG) 100 UNIT/ML injection Inject 0-5 Units into the skin 3 (three) times daily before meals. Sliding scale   Yes [provider]  loperamide (IMODIUM) 2 MG capsule Take 6 mg  by mouth daily as needed for diarrhea or loose stools.    Yes [provider]  LORazepam (ATIVAN) 0.5 MG tablet TAKE 1 TABLET(0.5 MG) BY MOUTH TWICE DAILY AS NEEDED FOR ANXIETY Patient taking differently: Take 0.5 mg by mouth 2 (two) times daily as needed for anxiety. 03/07/20  Yes [name], MD  metoCLOPramide (REGLAN) 5 MG tablet Take 1 tablet (5 mg total) by mouth in the morning and at bedtime. Please keep your January appt for further refills. Thank you 03/28/20  Yes Armbruster, Carlota Raspberry, MD  mirtazapine (REMERON) 30 MG tablet TAKE 1 TABLET BY MOUTH AT  BEDTIME Patient taking differently: Take 30 mg by mouth at bedtime. 01/17/20  Yes [name], MD  oxyCODONE (ROXICODONE) 5 MG immediate release tablet Take 1 tablet (5 mg total) by mouth every 6 (six) hours as needed for severe pain. 05/31/20  Yes Persons, Bevely Palmer, PA  pantoprazole (PROTONIX) 40 MG tablet Take 1 tablet (40 mg total) by mouth 2 (two) times daily. 12/06/19  Yes Armbruster, Carlota Raspberry, MD  silver sulfADIAZINE (SILVADENE) 1 % cream Apply  1 application topically daily. Apply small amount to foot wound daily cover with dry dressing 05/24/20  Yes Persons, Bevely Palmer, PA  Continuous Blood Gluc Sensor (DEXCOM G6 SENSOR) MISC USE AS DIRECTED WITH CONTINUOUS BLOOD GLUCOSE MONITOR. REPLACE EVERY 10 DAYS 02/27/20   Renato Shin, MD  Continuous Blood Gluc Transmit (DEXCOM G6 TRANSMITTER) MISC USE AS DIRECTED TO  MONITOR  GLUCOSE  LEVELS 05/10/20   Renato Shin, MD  gabapentin (NEURONTIN) 300 MG capsule Take 1 capsule (300 mg total) by mouth at bedtime. Patient not taking: Reported on 06/04/2020 04/20/20   [name], MD  Insulin NPH, Human,, Isophane, (HUMULIN N KWIKPEN) 100 UNIT/ML Kiwkpen Inject 10 Units into the skin 2 (two) times daily at 8 am and 10 pm. Patient not taking: Reported on 06/04/2020 05/24/20   Mercy Riding, MD  ondansetron (ZOFRAN ODT) 4 MG disintegrating tablet Take 1 tablet (4 mg total) by mouth every 8 (eight) hours as needed for nausea or vomiting. 04/06/20   Lucretia Kern, DO  potassium chloride SA (KLOR-CON) 20 MEQ tablet Take 1 tablet (20 mEq total) by mouth daily for 5 doses. 07/04/19 07/30/19  Janeece Fitting, PA-C    Physical Exam: Vitals:   06/04/20 0900 06/04/20 0915 06/04/20 0918 06/04/20 0930  BP: (!) 161/93 (!) 165/101  (!) 162/96  Pulse: 80   80  Resp: _0 Temp:   99 F (37.2 C)   TempSrc:   Oral   SpO2: 97%   96%    General: 30 y.o. female resting in bed in NAD Eyes: PERRL, normal sclera ENMT: Nares patent w/o discharge, orophaynx clear, dentition normal, ears w/o discharge/lesions/ulcers Neck: Supple, trachea midline Cardiovascular: RRR, +S1, S2, no m/g/r, equal pulses throughout Respiratory:  Soft scattered upper field wheeze, she has additional rhonchi in L upper field anteriorly, she had soft rhales at bases, Slightly increased WOB GI: BS+, NDNT, no masses noted, no organomegaly noted MSK: No c/c; RLE foot wound w/ slight beefy appearance, with granulation tissue and some drainage; LLE 2+  pitting edema Skin: No bruises; chronic venous changes of RLE Neuro: A&O x 3, no focal deficits Psyc: Appropriate interaction and affect, calm/cooperative  Labs on Admission: I have personally reviewed following labs and imaging studies  CBC: Recent Labs  Lab 06/04/20 0420  WBC 8.9  NEUTROABS 6.2  HGB 7.9*  HCT 26.6*  MCV 96.7  PLT 366   Basic Metabolic Panel: Recent Labs  Lab 06/04/20 0522 06/04/20 0747  NA 136  --   K 7.0* 6.3*  CL 110  --   CO2 22  --   GLUCOSE 77  --   BUN 33*  --   CREATININE 1.30*  --   CALCIUM 8.3*  --    GFR: Estimated Creatinine Clearance: 70.6 mL/min (A) (by C-G formula based on SCr of 1.3 mg/dL (H)). Liver Function Tests: Recent Labs  Lab 06/04/20 0522  AST 34  ALT 58*  ALKPHOS 253*  BILITOT 0.6  PROT 6.8  ALBUMIN 2.0*   No results for input(s): LIPASE, AMYLASE in the last 168 hours. No results for input(s): AMMONIA in the last 168 hours. Coagulation Profile: Recent Labs  Lab 06/04/20 0420  INR 1.2   Cardiac Enzymes: No results for input(s): CKTOTAL, CKMB, CKMBINDEX, TROPONINI in the last 168 hours. BNP (last 3 results) No results for input(s): PROBNP in the last 8760 hours. HbA1C: No results for input(s): HGBA1C in the last 72 hours. CBG: Recent Labs  Lab 06/04/20 0440 06/04/20 0724 06/04/20 0801  GLUCAP 83 41* 88   Lipid Profile: No results for input(s): CHOL, HDL, LDLCALC, TRIG, CHOLHDL, LDLDIRECT in the last 72 hours. Thyroid Function Tests: No results for input(s): TSH, T4TOTAL, FREET4, T3FREE, THYROIDAB in the last 72 hours. Anemia Panel: No results for input(s): VITAMINB12, FOLATE, FERRITIN, TIBC, IRON, RETICCTPCT in the last 72 hours. Urine analysis:    Component Value Date/Time   COLORURINE YELLOW 06/04/2020 0813   APPEARANCEUR CLEAR 06/04/2020 0813   LABSPEC 1.012 06/04/2020 0813   PHURINE 5.0 06/04/2020 0813   GLUCOSEU NEGATIVE 06/04/2020 0813   HGBUR SMALL (A) 06/04/2020 0813   BILIRUBINUR  NEGATIVE 06/04/2020 0813   BILIRUBINUR negative 01/02/2015 1717   KETONESUR NEGATIVE 06/04/2020 0813   PROTEINUR 100 (A) 06/04/2020 0813   UROBILINOGEN 0.2 01/13/2015 0223   NITRITE NEGATIVE 06/04/2020 0813   LEUKOCYTESUR NEGATIVE 06/04/2020 0813    Radiological Exams on Admission: CT Angio Chest PE W/Cm &/Or Wo Cm  Result Date: 06/04/2020 CLINICAL DATA:  Chest pain. Shortness of breath. Pleurisy versus pleural effusion. EXAM: CT ANGIOGRAPHY CHEST WITH CONTRAST TECHNIQUE: Multidetector CT imaging of the chest was performed using the standard protocol during bolus administration of intravenous contrast. Multiplanar CT image reconstructions and MIPs were obtained to evaluate the vascular anatomy. CONTRAST:  55m OMNIPAQUE IOHEXOL 300 MG/ML SOLN, 752mOMNIPAQUE IOHEXOL 350 MG/ML SOLN COMPARISON:  Chest radiograph-06/04/2020; 05/21/2020; 05/18/2020 FINDINGS: Vascular Findings: There is adequate opacification of the pulmonary arterial system with the main pulmonary artery measuring 248 Hounsfield units. There are no discrete filling defects within the pulmonary arterial tree to the level of the bilateral subsegmental pulmonary arteries. Evaluation of the distal subsegmental pulmonary arteries is degraded secondary to patient respiratory artifact. Normal caliber of the main pulmonary artery. Borderline cardiomegaly.  No pericardial effusion. No evidence of thoracic aortic aneurysm or dissection. Bovine configuration of the aortic arch. The branch vessels of the aortic arch appear widely patent throughout their imaged courses. The descending thoracic aorta is of normal caliber and widely patent without hemodynamically significant narrowing. Right upper extremity PICC line tip terminates within the superior cavoatrial junction. Review of the MIP images confirms the above findings. ---------------------------------------------------------------------------------- Nonvascular Findings: Mediastinum/Lymph Nodes:  Mediastinal, bilateral hilar and axillary lymph nodes are prominent though individually not enlarged by size criteria with index left axillary lymph node measuring 0.8 cm in  greatest short axis diameter (image 21, series 4) and index right axillary lymph node measuring 0.6 cm (image 45), presumably reactive in etiology. Lungs/Pleura: Small to moderate sized right-sided pleural effusion with trace left-sided pleural effusion. Extensive bilateral slightly nodular heterogeneous and airspace opacities with diffuse interlobular septal thickening. Bibasilar subpleural consolidative opacities. Relative areas of confluence are seen within the superior segment of the left lower lobe (image 50, series 6), with associated air bronchograms. The central pulmonary airways appear patent.  No pneumothorax. Upper abdomen: Limited early arterial phase evaluation of the upper abdomen is normal. Musculoskeletal: Diffuse body wall anasarca. No acute or aggressive osseous abnormalities. There is a punctate bone island within the T6 vertebral body. IMPRESSION: 1. No evidence of pulmonary embolism. 2. Small to moderate-sized right-sided pleural effusion and trace left-sided pleural effusion. 3. Extensive bilateral airspace opacities and interstitial thickening, nonspecific with broad differential considerations including multi lobar infection (including atypical etiology) and pulmonary edema. 4. Borderline cardiomegaly. 5. Diffuse body wall anasarca. 6. Prominent though non pathologically enlarged mediastinal, bilateral hilar and axillary lymph nodes, nonspecific though presumably reactive in etiology. Electronically Signed   By: Sandi Mariscal M.D.   On: 06/04/2020 07:50   CT FOOT RIGHT W CONTRAST  Result Date: 06/04/2020 CLINICAL DATA:  30 year old female with diabetes and foot swelling EXAM: CT OF THE LOWER RIGHT EXTREMITY WITH CONTRAST TECHNIQUE: Multidetector CT imaging of the lower right extremity was performed according to the  standard protocol following intravenous contrast administration. CONTRAST:  53m OMNIPAQUE IOHEXOL 300 MG/ML SOLN, 723mOMNIPAQUE IOHEXOL 350 MG/ML SOLN COMPARISON:  X-ray 06/04/2020, MRI 04/10/2020, 04/30/2020 FINDINGS: Surgical wound at the medial aspect of the forefoot related to recent first ray resection. Erosive changes within the distal aspect of the medial cuneiform is highly suspicious for osteomyelitis (series 3, images 45-48). Redemonstration of Lisfranc fracture dislocation with dorsal and lateral dislocation of the second metatarsal base at the second TMT joint. There is also dorsal subluxation of the third metatarsal base at the third TMT joint. Numerous small fractures at the TMT joints including within the second and third metatarsal bases as well as at the medial and intermediate cuneiform bones. There is also irregularity at the dorsal aspect of the distal cuboid. Scattered areas of heterotopic bone formation at the TMT joints. Bones are demineralized. Partially visualized hardware at the medial malleolus and distal fibula. Soft tissue thickening at the medial aspect of the forefoot. No organized or rim enhancing fluid collection. Denervation changes of the intrinsic foot musculature. No significant tenosynovial fluid. IMPRESSION: 1. Surgical wound at the medial aspect of the forefoot related to recent first ray resection. Erosive changes within the distal aspect of the medial cuneiform is highly suspicious for osteomyelitis. 2. Redemonstration of Lisfranc fracture-dislocation with dorsal and lateral dislocation of the second metatarsal base at the second TMT joint. There is also dorsal subluxation of the third metatarsal base at the third TMT joint. 3. Numerous fractures at the TMT joints including within the second and third metatarsal bases as well as at the medial and intermediate cuneiform bones, and distal cuboid. 4. No organized or rim enhancing fluid collection. Electronically Signed   By:  NiDavina Poke.O.   On: 06/04/2020 08:22   DG Chest Port 1 View  Result Date: 06/04/2020 CLINICAL DATA:  Sepsis EXAM: PORTABLE CHEST 1 VIEW COMPARISON:  05/21/2020 FINDINGS: Right upper extremity PICC line tip within the superior vena cava. Diffuse asymmetric airspace infiltrate has improved, particularly within the right upper lobe, though persists  in keeping with multifocal pneumonia. No pneumothorax or pleural effusion. Interval development of right lower lobe collapse. Cardiac size is mildly enlarged, unchanged. No acute bone abnormality. IMPRESSION: Interval development of right lower lobe collapse. Improving multifocal pulmonary infiltrates, most in keeping with multifocal pneumonia. Stable cardiomegaly Electronically Signed   By: Fidela Salisbury MD   On: 06/04/2020 04:19   DG Foot Complete Right  Result Date: 06/04/2020 CLINICAL DATA:  Sepsis EXAM: RIGHT FOOT COMPLETE - 3+ VIEW COMPARISON:  None. FINDINGS: Three view radiograph of the right foot demonstrates surgical changes of first ray amputation distal to the a medial cuneiform. There is extensive soft tissue swelling of the right forefoot as well as a large open defect involving the medial aspect of the right forefoot in keeping with dehiscence of the surgical wound. Additionally, there has developed a fracture of the base of the second metatarsal with lateral subluxation of the second and likely the third and fourth metatarsal shafts in keeping with a Lisfranc fracture dislocation. ORIF of the medial malleolus utilizing a single partially threaded screw and distal fibular ORIF utilizing lateral plate and screws are partially visualized. Advanced vascular calcifications are seen within the soft tissues. IMPRESSION: Open wound involving the site of right first ray amputation with extensive soft tissue swelling of the right forefoot in keeping with dehiscence of the surgical wound and probable superimposed cellulitis. Interval development of a  Lisfranc fracture dislocation with mild lateral subluxation of the second, third, and fourth metatarsals. This may be better assessed with CT imaging. Electronically Signed   By: Fidela Salisbury MD   On: 06/04/2020 04:26    EKG: Independently reviewed. NSR, no ST changes  Assessment/Plan Right diabetic foot wound/osteomyelitis     - admit to inpt, progressive [facility]     - will hold dapto, cipro; will resume rocephin d/t multifocal PNA     - ortho to see (Dr. [name]), possible surgery Wednesday     - ID also onboard (Dr. Linus Salmons); appreciate all consultants assistance   HFpEF exacerbation HTN     - CTA chest w/ pulm edema vs multifocal pna     - BNP is elevated     - start lasix 52m BID; follow daily wts, I&O     - continue coreg  Multifocal PNA     - Spoke with ID, resume rocephin for CAP coverage; zithro     - start IS, anti-tussives     - she is COVID negative  IDDM     - hypoglycemic this morning, will hold home lantus     - SSI, DM diet, glucose checks, A1c  Hyperkalemia     - lokelma, lasix, insulin     - rpt renal fxn panel at 1800  CKD3b     - looks like she's at baseline, watch for nephrotoxins, follow  Depression/Anxiety     - continue buspar, remeron, ativan  DVT prophylaxis: lovenox  Code Status: FULL  Family Communication: None at bedside  Consults called: Ortho, ID  Status is: Inpatient  Remains inpatient appropriate because:Inpatient level of care appropriate due to severity of illness   Dispo: The patient is from: Home              Anticipated d/c is to: TBD              Patient currently is not medically stable to d/c.   Difficult to place patient No  TJonnie FinnerDO [facility]  If  7PM-7AM, please contact night-coverage [email or web address]  06/04/2020, 9:56 AM

## 2020-06-04 NOTE — ED Triage Notes (Addendum)
Pt reports that she had her R big toe amputated at the end of Jan. Reports severe pain at the site. Concerned that it may be infected.

## 2020-06-04 NOTE — ED Notes (Signed)
Patient transported to CT 

## 2020-06-04 NOTE — ED Notes (Signed)
Report called and given to receiving nurse.

## 2020-06-05 ENCOUNTER — Other Ambulatory Visit: Payer: Self-pay | Admitting: Physician Assistant

## 2020-06-05 ENCOUNTER — Telehealth: Payer: Self-pay

## 2020-06-05 DIAGNOSIS — E1169 Type 2 diabetes mellitus with other specified complication: Secondary | ICD-10-CM

## 2020-06-05 DIAGNOSIS — M86271 Subacute osteomyelitis, right ankle and foot: Secondary | ICD-10-CM

## 2020-06-05 DIAGNOSIS — E1142 Type 2 diabetes mellitus with diabetic polyneuropathy: Secondary | ICD-10-CM

## 2020-06-05 DIAGNOSIS — J9611 Chronic respiratory failure with hypoxia: Secondary | ICD-10-CM

## 2020-06-05 DIAGNOSIS — E10618 Type 1 diabetes mellitus with other diabetic arthropathy: Secondary | ICD-10-CM

## 2020-06-05 DIAGNOSIS — T148XXA Other injury of unspecified body region, initial encounter: Secondary | ICD-10-CM

## 2020-06-05 DIAGNOSIS — R9389 Abnormal findings on diagnostic imaging of other specified body structures: Secondary | ICD-10-CM

## 2020-06-05 DIAGNOSIS — I5030 Unspecified diastolic (congestive) heart failure: Secondary | ICD-10-CM

## 2020-06-05 DIAGNOSIS — J189 Pneumonia, unspecified organism: Secondary | ICD-10-CM

## 2020-06-05 DIAGNOSIS — E43 Unspecified severe protein-calorie malnutrition: Secondary | ICD-10-CM

## 2020-06-05 DIAGNOSIS — I110 Hypertensive heart disease with heart failure: Secondary | ICD-10-CM

## 2020-06-05 DIAGNOSIS — E1165 Type 2 diabetes mellitus with hyperglycemia: Secondary | ICD-10-CM

## 2020-06-05 DIAGNOSIS — I10 Essential (primary) hypertension: Secondary | ICD-10-CM

## 2020-06-05 DIAGNOSIS — L089 Local infection of the skin and subcutaneous tissue, unspecified: Secondary | ICD-10-CM

## 2020-06-05 LAB — COMPREHENSIVE METABOLIC PANEL
ALT: 47 U/L — ABNORMAL HIGH (ref 0–44)
AST: 27 U/L (ref 15–41)
Albumin: 1.6 g/dL — ABNORMAL LOW (ref 3.5–5.0)
Alkaline Phosphatase: 234 U/L — ABNORMAL HIGH (ref 38–126)
Anion gap: 8 (ref 5–15)
BUN: 27 mg/dL — ABNORMAL HIGH (ref 6–20)
CO2: 22 mmol/L (ref 22–32)
Calcium: 8.5 mg/dL — ABNORMAL LOW (ref 8.9–10.3)
Chloride: 103 mmol/L (ref 98–111)
Creatinine, Ser: 1.44 mg/dL — ABNORMAL HIGH (ref 0.44–1.00)
GFR, Estimated: 50 mL/min — ABNORMAL LOW (ref >60)
Glucose, Bld: 278 mg/dL — ABNORMAL HIGH (ref 70–99)
Potassium: 5.5 mmol/L — ABNORMAL HIGH (ref 3.5–5.1)
Sodium: 133 mmol/L — ABNORMAL LOW (ref 135–145)
Total Bilirubin: 0.5 mg/dL (ref 0.3–1.2)
Total Protein: 6.2 g/dL — ABNORMAL LOW (ref 6.5–8.1)

## 2020-06-05 LAB — CBC
HCT: 24.7 % — ABNORMAL LOW (ref 36.0–46.0)
Hemoglobin: 7.7 g/dL — ABNORMAL LOW (ref 12.0–15.0)
MCH: 29.2 pg (ref 26.0–34.0)
MCHC: 31.2 g/dL (ref 30.0–36.0)
MCV: 93.6 fL (ref 80.0–100.0)
Platelets: 220 10*3/uL (ref 150–400)
RBC: 2.64 MIL/uL — ABNORMAL LOW (ref 3.87–5.11)
RDW: 15.8 % — ABNORMAL HIGH (ref 11.5–15.5)
WBC: 8.1 10*3/uL (ref 4.0–10.5)
nRBC: 0 % (ref 0.0–0.2)

## 2020-06-05 LAB — GLUCOSE, CAPILLARY
Glucose-Capillary: 142 mg/dL — ABNORMAL HIGH (ref 70–99)
Glucose-Capillary: 142 mg/dL — ABNORMAL HIGH (ref 70–99)
Glucose-Capillary: 241 mg/dL — ABNORMAL HIGH (ref 70–99)

## 2020-06-05 LAB — URINE CULTURE: Culture: NO GROWTH

## 2020-06-05 LAB — HEMOGLOBIN AND HEMATOCRIT, BLOOD
HCT: 27 % — ABNORMAL LOW (ref 36.0–46.0)
Hemoglobin: 8.5 g/dL — ABNORMAL LOW (ref 12.0–15.0)

## 2020-06-05 LAB — MRSA PCR SCREENING: MRSA by PCR: NEGATIVE

## 2020-06-05 LAB — PREPARE RBC (CROSSMATCH)

## 2020-06-05 MED ORDER — INSULIN GLARGINE 100 UNIT/ML ~~LOC~~ SOLN
10.0000 [IU] | Freq: Every day | SUBCUTANEOUS | Status: DC
  Administered 2020-06-05 – 2020-06-06 (×2): 10 [IU] via SUBCUTANEOUS
  Filled 2020-06-05 (×3): qty 0.1

## 2020-06-05 MED ORDER — INSULIN GLARGINE 100 UNIT/ML ~~LOC~~ SOLN
10.0000 [IU] | Freq: Two times a day (BID) | SUBCUTANEOUS | Status: DC
  Filled 2020-06-05 (×2): qty 0.1

## 2020-06-05 MED ORDER — SODIUM ZIRCONIUM CYCLOSILICATE 5 G PO PACK
5.0000 g | PACK | Freq: Once | ORAL | Status: AC
  Administered 2020-06-05: 5 g via ORAL
  Filled 2020-06-05: qty 1

## 2020-06-05 MED ORDER — SODIUM CHLORIDE 0.9% FLUSH: 10.0000 mL | INTRAVENOUS | Status: DC | PRN

## 2020-06-05 MED ORDER — CEFAZOLIN SODIUM-DEXTROSE 2-4 GM/100ML-% IV SOLN
2.0000 g | INTRAVENOUS | Status: DC
  Filled 2020-06-05: qty 100

## 2020-06-05 MED ORDER — SODIUM CHLORIDE 0.9% IV SOLUTION: Freq: Once | INTRAVENOUS | Status: AC

## 2020-06-05 NOTE — Care Management (Addendum)
Confirmed w [facility] liaison that patient is active for home health services.  Patient was also active w [facility] PTA.

## 2020-06-05 NOTE — Consult Note (Signed)
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 Was on dapto/cipro-->dapto/ceftriaxone up to this admission There was no surgical or swab culture of the foot previously Dr [name] has evaluated today and planned a bka  #chest ct bilateral pulm opacity #?CAP #hx HFpEF patient is treated for CAP this admisison. In 05/2020 during the admission, she had hypoxemic resp failure (multiple covid pcr was negative). Chart mention probable aspiration at that time. There was also a concern for dapto associated pulmonary toxicity. She underwent on 2/13 a bronchoscopy (culture/fungal cx/afb cx negative) and cell count with minimal eosinophil. She was continued on dapto at that time. There was no chest ct from then to compare to this admission. She was also treated with diuresis for presumed HFpEF exacerbation as well. Changes are rather persistent and atypical for aspiration. Could benefit from pulmonary input. Doesn't appear to be infectious process  #sepsis Patient had defervesed since admission (for the fever) and didn't have  leukocytosis/hemodynamic disturbance She is not currently on mrsa coverage for the foot or for the lung (has previous mrsa nares culture that is positive) but as above is improving Her admission blood cx is also negative I will defer mrsa coverage at this time   Plan: 1. Reasonable to continue ceftriaxone/azith for 5 days, although I do not suspect an infectious pneumonia process 2. Once bka is done she doesn't need any abx for the diabetic foot infection  3. Will need repeat chest ct in a few weeks. I am not sure if she had had the same chronic process since early 05/2020 but it doesn't appear we have a grasp on her underlying etiology at this time 4. 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 sodium chloride   Intravenous Once  . azithromycin  500 mg Oral Daily  . busPIRone  5 mg Oral TID  . carvedilol  12.5 mg Oral BID WC  . Chlorhexidine Gluconate Cloth  6 each Topical Daily  . enoxaparin (LOVENOX) injection  40 mg Subcutaneous Daily  . furosemide  40 mg Intravenous BID  . insulin aspart  0-6 Units Subcutaneous TID WC  . insulin glargine  10 Units Subcutaneous BID  . metoCLOPramide  5 mg Oral BID  . mirtazapine  30 mg Oral QHS  . pantoprazole  40 mg Oral BID  . sodium zirconium cyclosilicate  5 g Oral TID   Continuous Infusions: . cefTRIAXone (ROCEPHIN)  IV 2 g (06/04/20 1149)  PRN Meds:.acetaminophen **OR** acetaminophen, cyclobenzaprine, labetalol, LORazepam, morphine injection, oxyCODONE, sodium chloride flush  HPI: [name] is a 30 y.o. female dm, right foot ulcer/infection, recent hypoxemic resp failure, here for progressive right foot infection  Patient had issue with the right foot first hallux for a few months. She underwent original partial first ray 1/14. But since then had wound dehiscence  and evidence of ongoing infection despite empiric abx originally dapto/cipro. She had been followed by dr [name]. She had another admission and debridement early 05/2020. Seen by id during that admission. abx changed to dapto/ceftriaxone. Unfortunately, she never had operative cultures.   Middle of February had a hypoxemic event requiring admission/inbutation. She underwent a bronchoscopy at that time with negative fungal/afb cx. This was attributed to aspiration. daptomycin was a suspect as well but cell count without eosinophilia in BAL fluid  She had improved sob since that admission although she doesn't get around much. She had had dry cough and that is better as well  Denies orthopnea  She reports ongoing itnermittent subjective fever chill and ongoing severe pain/discharge (bloody/clear) from the right foot  During this admission, she was noted to have fever on presetnation but since it had resolved. No leukocytosis here She has been requiring 2 ltiers oxygen supplement here Chest ct bilateral severe airspace opacity Foot ct showed ongoing residual OM  Dr [name] will attempt to do bka within the next 24 hours  No n/v/diarrhea/rash  Feels well otherwise besides the foot issue. Again denies sob, chest pain No personal or family hx rheumatologic/autoimmune problem  No new medication recently   Taking ceftriaxone/daptomycin up to the day of discharge  Review of Systems: ROS Other ros negative  Past Medical History:  Diagnosis Date  . Allergy   . Anemia   . Anxiety   . Blood transfusion without reported diagnosis    Dec 2018  . Cataract    right eye  . COVID-19 03/2020  . Depression   . Diabetes type 1, uncontrolled ([place]) 11/14/2011   Since age 89  . Fibromyalgia   . Gastroparesis   . GERD (gastroesophageal reflux disease)   . Hypertension   . Infection    UTI April 2016    Social History   Tobacco Use  . Smoking status: Never Smoker  . Smokeless tobacco: Never Used   Vaping Use  . Vaping Use: Never used  Substance Use Topics  . Alcohol use: No    Alcohol/week: 0.0 standard drinks  . Drug use: Yes    Types: Marijuana    Family History  Problem Relation Age of Onset  . Diabetes Mother   . Hypertension Father   . Colon cancer Paternal Grandmother        pt thinks PGM was dx in her 29's  . Diabetes Paternal Grandmother   . Diabetes Maternal Grandmother   . Diabetes Maternal Grandfather   . Diabetes Paternal Grandfather   . Diabetes Other   . Breast cancer Paternal Aunt   . Esophageal cancer Neg Hx   . Liver cancer Neg Hx   . Pancreatic cancer Neg Hx   . Stomach cancer Neg Hx   . Rectal cancer Neg Hx    Allergies  Allergen Reactions  . Other Anaphylaxis    Reaction to [place] nuts   . Lactose Intolerance (Gi) Diarrhea    OBJECTIVE: Blood pressure (!) 173/83, pulse 95, temperature 98.4 F (36.9 C), temperature source Oral, resp. rate 20, height _0  (1.702 m),  weight 85 kg, SpO2 100 %.  Physical Exam No distress, pleasant, conversant Heent: normocephalic; per; conj clear; eomi; oropharynx clear Neck supple cv rrr no mrg Lungs normal respiratory effort; scattered rhonchi/wheeze abd s/nt Ext swelling RLE distally and foot, no edema left LE Skin no rash msk right foot open wound medially and s/p first ray amputation; clear/serosanguinous discharge; granulating base; tender surrounding swelling Neuro nonfocal Psych alert/oriented   Lab Results Lab Results  Component Value Date   WBC 8.1 06/05/2020   HGB 7.7 (L) 06/05/2020   HCT 24.7 (L) 06/05/2020   MCV 93.6 06/05/2020   PLT 220 06/05/2020    Lab Results  Component Value Date   CREATININE 1.44 (H) 06/05/2020   BUN 27 (H) 06/05/2020   NA 133 (L) 06/05/2020   K 5.5 (H) 06/05/2020   CL 103 06/05/2020   CO2 22 06/05/2020    Lab Results  Component Value Date   ALT 47 (H) 06/05/2020   AST 27 06/05/2020   ALKPHOS 234 (H) 06/05/2020   BILITOT 0.5 06/05/2020      Microbiology: Recent Results (from the past 240 hour(s))  Blood culture (routine x 2)     Status: None (Preliminary result)   Collection Time: 06/04/20  3:47 AM   Specimen: BLOOD  Result Value Ref Range Status   Specimen Description   Final    BLOOD BLOOD LEFT FOREARM Performed at [facility], [address] [place], [place] [address]    Special Requests   Final    BOTTLES DRAWN AEROBIC AND ANAEROBIC Blood Culture adequate volume Performed at [facility] [address] [place], [place] [address]    Culture   Final    NO GROWTH < 12 HOURS Performed at [facility] [address] [place], [place] [address]    Report Status PENDING  Incomplete  Blood culture (routine x 2)     Status: None (Preliminary result)   Collection Time: 06/04/20  5:07 AM   Specimen: BLOOD  Result Value Ref Range Status   Specimen Description   Final    BLOOD BLOOD RIGHT HAND Performed at [facility] [address], [place], [place] [address]    Special Requests   Final    BOTTLES DRAWN AEROBIC AND ANAEROBIC Blood Culture adequate volume Performed at [facility] [address] [place], [place] [address]    Culture   Final    NO GROWTH < 12 HOURS Performed at [facility] [address], [place], [place] [address]    Report Status PENDING  Incomplete  Resp Panel by RT-PCR (Flu A&B, Covid) Nasopharyngeal Swab     Status: None   Collection Time: 06/04/20  7:18 AM   Specimen: Nasopharyngeal Swab; Nasopharyngeal(NP) swabs in vial transport medium  Result Value Ref Range Status   SARS Coronavirus 2 by RT PCR NEGATIVE NEGATIVE Final    Comment: (NOTE) SARS-CoV-2 target nucleic acids are NOT DETECTED.  The SARS-CoV-2 RNA is generally detectable in upper respiratory specimens during the acute phase of infection. The lowest concentration of SARS-CoV-2 viral copies this assay can detect is 138 copies/mL. A  negative result does not preclude SARS-Cov-2 infection and should not be used as the sole basis for treatment or other patient management decisions. A negative result may occur with  improper specimen collection/handling, submission of specimen other than nasopharyngeal swab, presence of viral mutation(s) within the areas targeted by this assay, and inadequate  number of viral copies(<138 copies/mL). A negative result must be combined with clinical observations, patient history, and epidemiological information. The expected result is Negative.  Fact Sheet for Patients:  EntrepreneurPulse.com.au  Fact Sheet for Healthcare Providers:  IncredibleEmployment.be  This test is no t yet approved or cleared by the [place] FDA and  has been authorized for detection and/or diagnosis of SARS-CoV-2 by FDA under an Emergency Use Authorization (EUA). This EUA will remain  in effect (meaning this test can be used) for the duration of the COVID-19 declaration under Section 564(b)(1) of the Act, 21 U.S.C.section 360bbb-3(b)(1), unless the authorization is terminated  or revoked sooner.       Influenza A by PCR NEGATIVE NEGATIVE Final   Influenza B by PCR NEGATIVE NEGATIVE Final    Comment: (NOTE) The Xpert Xpress SARS-CoV-2/FLU/RSV plus assay is intended as an aid in the diagnosis of influenza from Nasopharyngeal swab specimens and should not be used as a sole basis for treatment. Nasal washings and aspirates are unacceptable for Xpert Xpress SARS-CoV-2/FLU/RSV testing.  Fact Sheet for Patients: EntrepreneurPulse.com.au  Fact Sheet for Healthcare Providers: IncredibleEmployment.be  This test is not yet approved or cleared by the [place] FDA and has been authorized for detection and/or diagnosis of SARS-CoV-2 by FDA under an Emergency Use Authorization (EUA). This EUA will remain in effect (meaning this test can  be used) for the duration of the COVID-19 declaration under Section 564(b)(1) of the Act, 21 U.S.C. section 360bbb-3(b)(1), unless the authorization is terminated or revoked.  Performed at [facility], [place] [address] [place], [place] [address]   Urine culture     Status: None   Collection Time: 06/04/20  8:13 AM   Specimen: In/Out Cath Urine  Result Value Ref Range Status   Specimen Description   Final    IN/OUT CATH URINE Performed at [facility] [address] [place], [place] [address]    Special Requests   Final    NONE Performed at [facility], [place] [address] [place], [place] [address]    Culture   Final    NO GROWTH Performed at [facility], [place] [address], [place], [place] [address]    Report Status 06/05/2020 FINAL  Final     Serology:   Imaging: If present, new imagings (plain films, ct scans, and mri) have been personally visualized and interpreted; radiology reports have been reviewed. Decision making incorporated into the Impression / Recommendations.  3/07 foot ct 1. Surgical wound at the medial aspect of the forefoot related to recent first ray resection. Erosive changes within the distal aspect of the medial cuneiform is highly suspicious for osteomyelitis. 2. Redemonstration of Lisfranc fracture-dislocation with dorsal and lateral dislocation of the second metatarsal base at the second TMT joint. There is also dorsal subluxation of the third metatarsal base at the third TMT joint. 3. Numerous fractures at the TMT joints including within the second and third metatarsal bases as well as at the medial and intermediate cuneiform bones, and distal cuboid. 4. No organized or rim enhancing fluid collection  3/07 chest cta 1. No evidence of pulmonary embolism. 2. Small to moderate-sized right-sided pleural effusion and trace left-sided pleural effusion. 3. Extensive bilateral airspace  opacities and interstitial thickening, nonspecific with broad differential considerations including multi lobar infection (including atypical etiology) and pulmonary edema. 4. Borderline cardiomegaly. 5. Diffuse body wall anasarca. 6. Prominent though non pathologically enlarged mediastinal, bilateral hilar and axillary lymph  nodes, nonspecific though presumably reactive in etiology  2/16 echo 1. Left ventricular ejection fraction, by estimation, is 55 to 60%. The  left ventricle has normal function. The left ventricle has no regional  wall motion abnormalities. There is mild left ventricular hypertrophy.  Left ventricular diastolic parameters  are consistent with Grade II diastolic dysfunction (pseudonormalization).  Elevated left atrial pressure.  2. Right ventricular systolic function is normal. The right ventricular  size is normal. There is mildly elevated pulmonary artery systolic  pressure.  3. The mitral valve is normal in structure. No evidence of mitral valve  regurgitation.  4. The aortic valve is tricuspid. Aortic valve regurgitation is not  visualized. No aortic stenosis is present.   Jabier Mutton, [facility] [phone] pager    06/05/2020, 11:08 AM

## 2020-06-05 NOTE — Progress Notes (Addendum)
PROGRESS NOTE        PATIENT DETAILS Name: BRANDY ZUBA Age: 30 y.o. Sex: female Date of Birth: [birth year] Admit Date: 06/04/2020 Admitting Physician Jonnie Finner, DO UXN:ATFTDD, Algis Greenhouse, MD  Brief Narrative: Patient is a 30 y.o. 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  See below for further details  Significant events: 2/12-2/24>> hospitalization for hypoxia due to CHF/aspiration pneumonia, right foot osteo-s/p ray amputation with wound dehiscence-requiring intubation-subsequently discharged on daptomycin/ceftriaxone 3/7>> admit to [facility] for right foot osteomyelitis/diastolic heart failure/aspiration pneumonia  Significant studies: 2/16>> Echo: EF 22-02%, grade 2 diastolic dysfunction 5/4>> chest x-ray: Multifocal pulmonary infiltrates 3/7>> CT right foot with contrast: Suspicious for osteomyelitis at distal cuneiform. 3/7>> CTA chest: No PE, right pleural effusion, extensive bilateral airspace opacities, anasarca  Antimicrobial therapy: Rocephin: 3/7>> Zithromax: 3/7>>  Microbiology data: 2/13>> respiratory virus panel: Negative 2/13>> BAL: Cultures negative 2/13>> BAL FEB/fungal culture: Pending 2/14>> blood cultures: Negative 3/7>> blood cultures: No growth 3/7>> urine culture: No growth  Procedures : 1/14>> right ray amputation by Dr. [name] 2/13>> bronchoscopy 2/13-2/15>> intubation/extubation 2/16-2/19>> reintubated/extubated  Consults: Orthopedics ID  DVT Prophylaxis : enoxaparin (LOVENOX) injection 40 mg Start: 06/04/20 1200   Subjective: Lying comfortably in bed-has significant swelling of lower extremities-on 2 L of oxygen.   Assessment/Plan: Acute osteomyelitis of right midfoot-history of  right first ray amputation on 1/14 with subsequent wound dehiscence: Unfortunately-has developed osteomyelitis in spite of IV antibiotics and wound care-orthopedics following-plans for TMA on 3/9.  Culture data/antibiotics as above.  Sepsis physiology ruled out-do not think she had sepsis on admission.  Acute hypoxic respiratory failure due to HFpEF exacerbation +/-residual pneumonia: Continue Lasix/IV antibiotics-follow culture data/volume status/electrolytes.  Anasarca: Due to a combination of HFpEF, diabetic nephropathy/hypoalbuminemia.  Continue diuretics-follow volume status closely.  CKD stage IIIb: Creatinine close to baseline-follow closely.  Hyperkalemia: Lokelma x2 doses-repeat electrolytes tomorrow  HTN: Stable-continue Coreg  DM-1: Was hypoglycemic yesterday-we will resume Lantus 10 units-daily dosing for now.  Continue SSI and follow.  Recent Labs    06/04/20 1202 06/04/20 1739 06/04/20 2307  GLUCAP 94 164* 283*   Suspected history of gastroparesis: On Reglan  Depression/anxiety: Stable continue BuSpar/Remeron  GERD: Continue PPI  Anemia: Secondary to CKD-follow and transfuse if hemoglobin <7   Diet: Diet Order            Diet Carb Modified Fluid consistency: Thin; Room service appropriate? Yes  Diet effective now                  Code Status: Full code   Family Communication: None at bedside-patient awake/alert-can update family herself-I will reach out to family over the next few days.  Disposition Plan: Status is: Inpatient  Remains inpatient appropriate because:Inpatient level of care appropriate due to severity of illness   Dispo: The patient is from: Home              Anticipated d/c is to: Home              Patient currently is not medically stable to d/c.   Difficult to place patient No   Barriers to Discharge: Right foot osteomyelitis-hypoxia due to PNA-on IV  antibiotics-transmetatarsal amputation scheduled for 3/9  Antimicrobial  agents: Anti-infectives (From admission, onward)   Start     Dose/Rate Route Frequency Ordered Stop   06/04/20 1130  cefTRIAXone (ROCEPHIN) 2 g in sodium chloride 0.9 % 100 mL IVPB        2 g 200 mL/hr over 30 Minutes Intravenous Every 24 hours 06/04/20 1122     06/04/20 1130  azithromycin (ZITHROMAX) tablet 500 mg        500 mg Oral Daily 06/04/20 1122         Time spent: 35 minutes-Greater than 50% of this time was spent in counseling, explanation of diagnosis, planning of further management, and coordination of care.  MEDICATIONS: Scheduled Meds: . sodium chloride   Intravenous Once  . azithromycin  500 mg Oral Daily  . busPIRone  5 mg Oral TID  . carvedilol  12.5 mg Oral BID WC  . Chlorhexidine Gluconate Cloth  6 each Topical Daily  . enoxaparin (LOVENOX) injection  40 mg Subcutaneous Daily  . furosemide  40 mg Intravenous BID  . insulin aspart  0-6 Units Subcutaneous TID WC  . metoCLOPramide  5 mg Oral BID  . mirtazapine  30 mg Oral QHS  . pantoprazole  40 mg Oral BID  . sodium zirconium cyclosilicate  5 g Oral TID   Continuous Infusions: . cefTRIAXone (ROCEPHIN)  IV 2 g (06/04/20 1149)   PRN Meds:.acetaminophen **OR** acetaminophen, cyclobenzaprine, labetalol, LORazepam, morphine injection, oxyCODONE, sodium chloride flush   PHYSICAL EXAM: Vital signs: Vitals:   06/05/20 0000 06/05/20 0400 06/05/20 0500 06/05/20 0755  BP: (!) 151/76 (!) 145/81  (!) 173/83  Pulse: 90 86  95  Resp: _0 Temp: 98 F (36.7 C) 98.1 F (36.7 C)  98.4 F (36.9 C)  TempSrc: Oral Oral  Oral  SpO2: 95% 98%  100%  Weight:   85 kg   Height:       Filed Weights   06/04/20 1930 06/05/20 0500  Weight: 85 kg 85 kg   Body mass index is 29.35 kg/m.   Gen Exam:Alert awake-not in any distress HEENT:atraumatic, normocephalic Chest: B/L clear to auscultation anteriorly CVS:S1S2 regular Abdomen:soft non tender, non distended Extremities:++ edema-right foot in dressing-did not  open Neurology: Non focal Skin: no rash  I have personally reviewed following labs and imaging studies  LABORATORY DATA: CBC: Recent Labs  Lab 06/04/20 0420 06/05/20 0350  WBC 8.9 8.1  NEUTROABS 6.2  --   HGB 7.9* 7.7*  HCT 26.6* 24.7*  MCV 96.7 93.6  PLT 236 416    Basic Metabolic Panel: Recent Labs  Lab 06/04/20 0522 06/04/20 0747 06/04/20 2205 06/05/20 0350  NA 136  --  131* 133*  K 7.0* 6.3* 5.8* 5.5*  CL 110  --  103 103  CO2 22  --  20* 22  GLUCOSE 77  --  285* 278*  BUN 33*  --  27* 27*  CREATININE 1.30*  --  1.40* 1.44*  CALCIUM 8.3*  --  8.3* 8.5*  PHOS  --   --  4.4  --     GFR: Estimated Creatinine Clearance: 64 mL/min (A) (by C-G formula based on SCr of 1.44 mg/dL (H)).  Liver Function Tests: Recent Labs  Lab 06/04/20 0522 06/04/20 2205 06/05/20 0350  AST 34  --  27  ALT 58*  --  47*  ALKPHOS 253*  --  234*  BILITOT 0.6  --  0.5  PROT 6.8  --  6.2*  ALBUMIN 2.0* 1.7* 1.6*   No results for input(s): LIPASE, AMYLASE in the last 168 hours. No results for input(s): AMMONIA in the last 168 hours.  Coagulation Profile: Recent Labs  Lab 06/04/20 0420  INR 1.2    Cardiac Enzymes: No results for input(s): CKTOTAL, CKMB, CKMBINDEX, TROPONINI in the last 168 hours.  BNP (last 3 results) No results for input(s): PROBNP in the last 8760 hours.  Lipid Profile: No results for input(s): CHOL, HDL, LDLCALC, TRIG, CHOLHDL, LDLDIRECT in the last 72 hours.  Thyroid Function Tests: No results for input(s): TSH, T4TOTAL, FREET4, T3FREE, THYROIDAB in the last 72 hours.  Anemia Panel: No results for input(s): VITAMINB12, FOLATE, FERRITIN, TIBC, IRON, RETICCTPCT in the last 72 hours.  Urine analysis:    Component Value Date/Time   COLORURINE YELLOW 06/04/2020 0813   APPEARANCEUR CLEAR 06/04/2020 0813   LABSPEC 1.012 06/04/2020 0813   PHURINE 5.0 06/04/2020 0813   GLUCOSEU NEGATIVE 06/04/2020 0813   HGBUR SMALL (A) 06/04/2020 0813    BILIRUBINUR NEGATIVE 06/04/2020 0813   BILIRUBINUR negative 01/02/2015 1717   KETONESUR NEGATIVE 06/04/2020 0813   PROTEINUR 100 (A) 06/04/2020 0813   UROBILINOGEN 0.2 01/13/2015 0223   NITRITE NEGATIVE 06/04/2020 0813   LEUKOCYTESUR NEGATIVE 06/04/2020 0813    Sepsis Labs: Lactic Acid, Venous    Component Value Date/Time   LATICACIDVEN 0.6 06/04/2020 0420    MICROBIOLOGY: Recent Results (from the past 240 hour(s))  Blood culture (routine x 2)     Status: None (Preliminary result)   Collection Time: 06/04/20  3:47 AM   Specimen: BLOOD  Result Value Ref Range Status   Specimen Description   Final    BLOOD BLOOD LEFT FOREARM Performed at [facility], [place] [address] [place], [place] [address]    Special Requests   Final    BOTTLES DRAWN AEROBIC AND ANAEROBIC Blood Culture adequate volume Performed at [facility] [address], [place], [place] [address]    Culture   Final    NO GROWTH < 12 HOURS Performed at [facility] [address] [place], [place] [address]    Report Status PENDING  Incomplete  Blood culture (routine x 2)     Status: None (Preliminary result)   Collection Time: 06/04/20  5:07 AM   Specimen: BLOOD  Result Value Ref Range Status   Specimen Description   Final    BLOOD BLOOD RIGHT HAND Performed at [facility] [address], [place], [place] [address]    Special Requests   Final    BOTTLES DRAWN AEROBIC AND ANAEROBIC Blood Culture adequate volume Performed at [facility] [address], [place], [place] [address]    Culture   Final    NO GROWTH < 12 HOURS Performed at [facility] [address] [place], [place] [address]    Report Status PENDING  Incomplete  Resp Panel by RT-PCR (Flu A&B, Covid) Nasopharyngeal Swab     Status: None   Collection Time: 06/04/20  7:18 AM   Specimen: Nasopharyngeal Swab; Nasopharyngeal(NP) swabs in vial transport  medium  Result Value Ref Range Status   SARS Coronavirus 2 by RT PCR NEGATIVE NEGATIVE Final    Comment: (NOTE) SARS-CoV-2 target nucleic acids are NOT DETECTED.  The SARS-CoV-2 RNA is generally detectable in upper respiratory specimens during the acute phase of infection. The lowest concentration of SARS-CoV-2 viral copies this assay can detect is 138  copies/mL. A negative result does not preclude SARS-Cov-2 infection and should not be used as the sole basis for treatment or other patient management decisions. A negative result may occur with  improper specimen collection/handling, submission of specimen other than nasopharyngeal swab, presence of viral mutation(s) within the areas targeted by this assay, and inadequate number of viral copies(<138 copies/mL). A negative result must be combined with clinical observations, patient history, and epidemiological information. The expected result is Negative.  Fact Sheet for Patients:  EntrepreneurPulse.com.au  Fact Sheet for Healthcare Providers:  IncredibleEmployment.be  This test is no t yet approved or cleared by the [place] FDA and  has been authorized for detection and/or diagnosis of SARS-CoV-2 by FDA under an Emergency Use Authorization (EUA). This EUA will remain  in effect (meaning this test can be used) for the duration of the COVID-19 declaration under Section 564(b)(1) of the Act, 21 U.S.C.section 360bbb-3(b)(1), unless the authorization is terminated  or revoked sooner.       Influenza A by PCR NEGATIVE NEGATIVE Final   Influenza B by PCR NEGATIVE NEGATIVE Final    Comment: (NOTE) The Xpert Xpress SARS-CoV-2/FLU/RSV plus assay is intended as an aid in the diagnosis of influenza from Nasopharyngeal swab specimens and should not be used as a sole basis for treatment. Nasal washings and aspirates are unacceptable for Xpert Xpress SARS-CoV-2/FLU/RSV testing.  Fact Sheet for  Patients: EntrepreneurPulse.com.au  Fact Sheet for Healthcare Providers: IncredibleEmployment.be  This test is not yet approved or cleared by the [place] FDA and has been authorized for detection and/or diagnosis of SARS-CoV-2 by FDA under an Emergency Use Authorization (EUA). This EUA will remain in effect (meaning this test can be used) for the duration of the COVID-19 declaration under Section 564(b)(1) of the Act, 21 U.S.C. section 360bbb-3(b)(1), unless the authorization is terminated or revoked.  Performed at [facility], [place] [address] [place], [place] [address]   Urine culture     Status: None   Collection Time: 06/04/20  8:13 AM   Specimen: In/Out Cath Urine  Result Value Ref Range Status   Specimen Description   Final    IN/OUT CATH URINE Performed at [facility] [address], [place], [place] [address]    Special Requests   Final    NONE Performed at [facility], [place] [address] [place], [place] [address]    Culture   Final    NO GROWTH Performed at [facility], [place] [address], [place], [place] [address]    Report Status 06/05/2020 FINAL  Final    RADIOLOGY STUDIES/RESULTS: CT Angio Chest PE W/Cm &/Or Wo Cm  Result Date: 06/04/2020 CLINICAL DATA:  Chest pain. Shortness of breath. Pleurisy versus pleural effusion. EXAM: CT ANGIOGRAPHY CHEST WITH CONTRAST TECHNIQUE: Multidetector CT imaging of the chest was performed using the standard protocol during bolus administration of intravenous contrast. Multiplanar CT image reconstructions and MIPs were obtained to evaluate the vascular anatomy. CONTRAST:  38m OMNIPAQUE IOHEXOL 300 MG/ML SOLN, 738mOMNIPAQUE IOHEXOL 350 MG/ML SOLN COMPARISON:  Chest radiograph-06/04/2020; 05/21/2020; 05/18/2020 FINDINGS: Vascular Findings: There is adequate opacification of the pulmonary arterial system with the main pulmonary  artery measuring 248 Hounsfield units. There are no discrete filling defects within the pulmonary arterial tree to the level of the bilateral subsegmental pulmonary arteries. Evaluation of the distal subsegmental pulmonary arteries is degraded secondary to patient respiratory artifact. Normal caliber of the main pulmonary artery. Borderline cardiomegaly.  No  pericardial effusion. No evidence of thoracic aortic aneurysm or dissection. Bovine configuration of the aortic arch. The branch vessels of the aortic arch appear widely patent throughout their imaged courses. The descending thoracic aorta is of normal caliber and widely patent without hemodynamically significant narrowing. Right upper extremity PICC line tip terminates within the superior cavoatrial junction. Review of the MIP images confirms the above findings. ---------------------------------------------------------------------------------- Nonvascular Findings: Mediastinum/Lymph Nodes: Mediastinal, bilateral hilar and axillary lymph nodes are prominent though individually not enlarged by size criteria with index left axillary lymph node measuring 0.8 cm in greatest short axis diameter (image 21, series 4) and index right axillary lymph node measuring 0.6 cm (image 45), presumably reactive in etiology. Lungs/Pleura: Small to moderate sized right-sided pleural effusion with trace left-sided pleural effusion. Extensive bilateral slightly nodular heterogeneous and airspace opacities with diffuse interlobular septal thickening. Bibasilar subpleural consolidative opacities. Relative areas of confluence are seen within the superior segment of the left lower lobe (image 50, series 6), with associated air bronchograms. The central pulmonary airways appear patent.  No pneumothorax. Upper abdomen: Limited early arterial phase evaluation of the upper abdomen is normal. Musculoskeletal: Diffuse body wall anasarca. No acute or aggressive osseous abnormalities. There is a  punctate bone island within the T6 vertebral body. IMPRESSION: 1. No evidence of pulmonary embolism. 2. Small to moderate-sized right-sided pleural effusion and trace left-sided pleural effusion. 3. Extensive bilateral airspace opacities and interstitial thickening, nonspecific with broad differential considerations including multi lobar infection (including atypical etiology) and pulmonary edema. 4. Borderline cardiomegaly. 5. Diffuse body wall anasarca. 6. Prominent though non pathologically enlarged mediastinal, bilateral hilar and axillary lymph nodes, nonspecific though presumably reactive in etiology. Electronically Signed   By: Sandi Mariscal M.D.   On: 06/04/2020 07:50   CT FOOT RIGHT W CONTRAST  Result Date: 06/04/2020 CLINICAL DATA:  30 year old female with diabetes and foot swelling EXAM: CT OF THE LOWER RIGHT EXTREMITY WITH CONTRAST TECHNIQUE: Multidetector CT imaging of the lower right extremity was performed according to the standard protocol following intravenous contrast administration. CONTRAST:  37m OMNIPAQUE IOHEXOL 300 MG/ML SOLN, 747mOMNIPAQUE IOHEXOL 350 MG/ML SOLN COMPARISON:  X-ray 06/04/2020, MRI 04/10/2020, 04/30/2020 FINDINGS: Surgical wound at the medial aspect of the forefoot related to recent first ray resection. Erosive changes within the distal aspect of the medial cuneiform is highly suspicious for osteomyelitis (series 3, images 45-48). Redemonstration of Lisfranc fracture dislocation with dorsal and lateral dislocation of the second metatarsal base at the second TMT joint. There is also dorsal subluxation of the third metatarsal base at the third TMT joint. Numerous small fractures at the TMT joints including within the second and third metatarsal bases as well as at the medial and intermediate cuneiform bones. There is also irregularity at the dorsal aspect of the distal cuboid. Scattered areas of heterotopic bone formation at the TMT joints. Bones are demineralized. Partially  visualized hardware at the medial malleolus and distal fibula. Soft tissue thickening at the medial aspect of the forefoot. No organized or rim enhancing fluid collection. Denervation changes of the intrinsic foot musculature. No significant tenosynovial fluid. IMPRESSION: 1. Surgical wound at the medial aspect of the forefoot related to recent first ray resection. Erosive changes within the distal aspect of the medial cuneiform is highly suspicious for osteomyelitis. 2. Redemonstration of Lisfranc fracture-dislocation with dorsal and lateral dislocation of the second metatarsal base at the second TMT joint. There is also dorsal subluxation of the third metatarsal base at the third TMT joint. 3. Numerous  fractures at the TMT joints including within the second and third metatarsal bases as well as at the medial and intermediate cuneiform bones, and distal cuboid. 4. No organized or rim enhancing fluid collection. Electronically Signed   By: Davina Poke D.O.   On: 06/04/2020 08:22   DG Chest Port 1 View  Result Date: 06/04/2020 CLINICAL DATA:  Sepsis EXAM: PORTABLE CHEST 1 VIEW COMPARISON:  05/21/2020 FINDINGS: Right upper extremity PICC line tip within the superior vena cava. Diffuse asymmetric airspace infiltrate has improved, particularly within the right upper lobe, though persists in keeping with multifocal pneumonia. No pneumothorax or pleural effusion. Interval development of right lower lobe collapse. Cardiac size is mildly enlarged, unchanged. No acute bone abnormality. IMPRESSION: Interval development of right lower lobe collapse. Improving multifocal pulmonary infiltrates, most in keeping with multifocal pneumonia. Stable cardiomegaly Electronically Signed   By: Fidela Salisbury MD   On: 06/04/2020 04:19   DG Foot Complete Right  Result Date: 06/04/2020 CLINICAL DATA:  Sepsis EXAM: RIGHT FOOT COMPLETE - 3+ VIEW COMPARISON:  None. FINDINGS: Three view radiograph of the right foot demonstrates  surgical changes of first ray amputation distal to the a medial cuneiform. There is extensive soft tissue swelling of the right forefoot as well as a large open defect involving the medial aspect of the right forefoot in keeping with dehiscence of the surgical wound. Additionally, there has developed a fracture of the base of the second metatarsal with lateral subluxation of the second and likely the third and fourth metatarsal shafts in keeping with a Lisfranc fracture dislocation. ORIF of the medial malleolus utilizing a single partially threaded screw and distal fibular ORIF utilizing lateral plate and screws are partially visualized. Advanced vascular calcifications are seen within the soft tissues. IMPRESSION: Open wound involving the site of right first ray amputation with extensive soft tissue swelling of the right forefoot in keeping with dehiscence of the surgical wound and probable superimposed cellulitis. Interval development of a Lisfranc fracture dislocation with mild lateral subluxation of the second, third, and fourth metatarsals. This may be better assessed with CT imaging. Electronically Signed   By: Fidela Salisbury MD   On: 06/04/2020 04:26     LOS: 1 day   Oren Binet, MD  [facility]    To contact the attending provider between 7A-7P or the covering provider during after hours 7P-7A, please log into the web site [email or web address] and access using universal [place] password for that web site. If you do not have the password, please call the hospital operator.  06/05/2020, 9:34 AM

## 2020-06-05 NOTE — Plan of Care (Signed)
Report received from [place], Therapist, sports. VS taken, pt oriented to room and call bell upon arrival. Pt wd OTA upon arrival. Non-adherent dressing placed upon wd and wrapped with kerlex. Skin assessment completed with Elisa.   Problem: Education: Goal: Knowledge of General Education information will improve Description: Including pain rating scale, medication(s)/side effects and non-pharmacologic comfort measures Outcome: Progressing   Problem: Health Behavior/Discharge Planning: Goal: Ability to manage health-related needs will improve Outcome: Progressing   Problem: Clinical Measurements: Goal: Ability to maintain clinical measurements within normal limits will improve Outcome: Progressing Goal: Will remain free from infection Outcome: Progressing Goal: Diagnostic test results will improve Outcome: Progressing Goal: Respiratory complications will improve Outcome: Progressing   Problem: Activity: Goal: Risk for activity intolerance will decrease Outcome: Progressing   Problem: Nutrition: Goal: Adequate nutrition will be maintained Outcome: Progressing   Problem: Coping: Goal: Level of anxiety will decrease Outcome: Progressing   Problem: Elimination: Goal: Will not experience complications related to bowel motility Outcome: Progressing Goal: Will not experience complications related to urinary retention Outcome: Progressing   Problem: Pain Managment: Goal: General experience of comfort will improve Outcome: Progressing   Problem: Safety: Goal: Ability to remain free from injury will improve Outcome: Progressing   Problem: Skin Integrity: Goal: Risk for impaired skin integrity will decrease Outcome: Progressing

## 2020-06-05 NOTE — Consult Note (Signed)
  ORTHOPAEDIC CONSULTATION  REQUESTING PHYSICIAN: Ghimire, Shanker M, MD  Chief Complaint: Painful ulceration right foot.  HPI: [name] is a 30 y.o. female who presents with nonhealing right foot first ray amputation.  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  Patient has undergone wound care and has improved granulation tissue around the chronic draining sinus tract but still has a deep infection.  Past Medical History:  Diagnosis Date  . Allergy   . Anemia   . Anxiety   . Blood transfusion without reported diagnosis    Dec 2018  . Cataract    right eye  . COVID-19 03/2020  . Depression   . Diabetes type 1, uncontrolled (HCC) 11/14/2011   Since age 10  . Fibromyalgia   . Gastroparesis   . GERD (gastroesophageal reflux disease)   . Hypertension   . Infection    UTI April 2016   Past Surgical History:  Procedure Laterality Date  . AMPUTATION Right 04/13/2020   Procedure: 1ST RAY AMPUTATION RIGHT FOOT;  Surgeon: Saira Kramme V, MD;  Location: [facility];  Service: Orthopedics;  Laterality: Right;  . ANKLE SURGERY    . CHOLECYSTECTOMY  11/15/2011   Procedure: LAPAROSCOPIC CHOLECYSTECTOMY WITH INTRAOPERATIVE CHOLANGIOGRAM;  Surgeon: Steven C. Gross, MD;  Location: [facility];  Service: General;  Laterality: N/A;  . COLONOSCOPY    . COLONOSCOPY WITH PROPOFOL N/A 06/27/2017   Procedure: COLONOSCOPY WITH PROPOFOL;  Surgeon: Jacobs, Daniel P, MD;  Location: [facility];  Service: Endoscopy;  Laterality: N/A;  . ESOPHAGOGASTRODUODENOSCOPY  12/03/2011   Procedure: ESOPHAGOGASTRODUODENOSCOPY (EGD);  Surgeon: Patrick D Hung, MD;  Location: [facility];  Service: Endoscopy;  Laterality: N/A;  . FLEXIBLE SIGMOIDOSCOPY N/A 03/10/2017   Procedure: FLEXIBLE SIGMOIDOSCOPY;  Surgeon: Hung, Patrick, MD;  Location: [facility];  Service: Endoscopy;  Laterality: N/A;  . INCISION AND DRAINAGE PERIRECTAL ABSCESS N/A 03/01/2017   Procedure:  IRRIGATION AND DEBRIDEMENT PERIRECTAL ABSCESS;  Surgeon: Newman, David, MD;  Location: [facility];  Service: General;  Laterality: N/A;  . IRRIGATION AND DEBRIDEMENT BUTTOCKS N/A 03/23/2017   Procedure: IRRIGATION AND DEBRIDEMENT BUTTOCKS, SETON PLACEMENT;  Surgeon: Thomas, Sandralee, MD;  Location: [facility];  Service: General;  Laterality: N/A;  . LAPAROSCOPY  11/23/2011   Procedure: LAPAROSCOPY DIAGNOSTIC;  Surgeon: Benjamin T Hoxworth, MD;  Location: [facility];  Service: General;  Laterality: N/A;  . SIGMOIDOSCOPY    . UPPER GASTROINTESTINAL ENDOSCOPY    . WISDOM TOOTH EXTRACTION     Social History   Socioeconomic History  . Marital status: Married    Spouse name: sergio  . Number of children: 0  . Years of education: college  . Highest education level: Not on file  Occupational History  . Occupation: [employer] call center    Employer: [employer]  Tobacco Use  . Smoking status: Never Smoker  . Smokeless tobacco: Never Used  Vaping Use  . Vaping Use: Never used  Substance and Sexual Activity  . Alcohol use: No    Alcohol/week: 0.0 standard drinks  . Drug use: Yes    Types: Marijuana  . Sexual activity: Not Currently    Partners: Male    Birth control/protection: None  Other Topics Concern  . Not on file  Social History Narrative  . Not on file   Social Determinants of Health   Financial Resource Strain: Not on file  Food Insecurity: Food Insecurity Present  . Worried About Running Out   of Food in the Last Year: Sometimes true  . Ran Out of Food in the Last Year: Sometimes true  Transportation Needs: No Transportation Needs  . Lack of Transportation (Medical): No  . Lack of Transportation (Non-Medical): No  Physical Activity: Not on file  Stress: Not on file  Social Connections: Not on file   Family History  Problem Relation Age of Onset  . Diabetes Mother   . Hypertension Father   . Colon cancer Paternal Grandmother        pt thinks PGM was dx in her 50's  .  Diabetes Paternal Grandmother   . Diabetes Maternal Grandmother   . Diabetes Maternal Grandfather   . Diabetes Paternal Grandfather   . Diabetes Other   . Breast cancer Paternal Aunt   . Esophageal cancer Neg Hx   . Liver cancer Neg Hx   . Pancreatic cancer Neg Hx   . Stomach cancer Neg Hx   . Rectal cancer Neg Hx    - negative except otherwise stated in the family history section Allergies  Allergen Reactions  . Other Anaphylaxis    Reaction to brazil nuts   . Lactose Intolerance (Gi) Diarrhea   Prior to Admission medications   Medication Sig Start Date End Date Taking? Authorizing Provider  acetaminophen (TYLENOL) 500 MG tablet Take 1 tablet (500 mg total) by mouth every 8 (eight) hours. 05/24/20 06/23/20 Yes Gonfa, Taye T, MD  busPIRone (BUSPAR) 5 MG tablet Take 1 tablet (5 mg total) by mouth 3 (three) times daily. 02/03/20  Yes Parker, Caleb M, MD  carvedilol (COREG) 12.5 MG tablet Take 1 tablet (12.5 mg total) by mouth 2 (two) times daily with a meal. 04/16/20 07/15/20 Yes Dahal, Binaya, MD  cefTRIAXone (ROCEPHIN) IVPB Inject 2 g into the vein daily for 21 days. Indication:  Wound infection/osteo right foot First Dose: Yes Last Day of Therapy:  06/13/20 Labs - Once weekly:  CBC/D and BMP, Labs - Every other week:  ESR and CRP Method of administration: IV Push Method of administration may be changed at the discretion of home infusion pharmacist based upon assessment of the patient and/or caregiver's ability to self-administer the medication ordered. 05/24/20 06/14/20 Yes Gonfa, Taye T, MD  cyclobenzaprine (FLEXERIL) 10 MG tablet TAKE 1 TABLET(10 MG) BY MOUTH THREE TIMES DAILY AS NEEDED FOR MUSCLE SPASMS Patient taking differently: Take 10 mg by mouth 3 (three) times daily as needed for muscle spasms. 04/02/20  Yes Parker, Caleb M, MD  daptomycin (CUBICIN) IVPB Inject 500 mg into the vein daily for 21 days. Indication:  Wound infection/osteo right foot  First Dose: Yes Last Day of  Therapy:  06/13/20 Labs - Once weekly:  CBC/D, BMP, and CPK Labs - Every other week:  ESR and CRP Method of administration: IV Push Method of administration may be changed at the discretion of home infusion pharmacist based upon assessment of the patient and/or caregiver's ability to self-administer the medication ordered. 05/24/20 06/14/20 Yes Gonfa, Taye T, MD  dicyclomine (BENTYL) 20 MG tablet Take 1 tablet (20 mg total) by mouth every 8 (eight) hours as needed for spasms. 11/25/19  Yes Petrucelli, Samantha R, PA-C  furosemide (LASIX) 40 MG tablet Take 1 tablet (40 mg total) by mouth daily. 05/24/20 11/20/20 Yes Gonfa, Taye T, MD  insulin glargine (LANTUS) 100 UNIT/ML injection Inject 10 Units into the skin 2 (two) times daily. Morning & bedtime   Yes [provider]  insulin lispro (HUMALOG) 100 UNIT/ML injection Inject   0-5 Units into the skin 3 (three) times daily before meals. Sliding scale   Yes [provider]  loperamide (IMODIUM) 2 MG capsule Take 6 mg by mouth daily as needed for diarrhea or loose stools.    Yes [provider]  LORazepam (ATIVAN) 0.5 MG tablet TAKE 1 TABLET(0.5 MG) BY MOUTH TWICE DAILY AS NEEDED FOR ANXIETY Patient taking differently: Take 0.5 mg by mouth 2 (two) times daily as needed for anxiety. 03/07/20  Yes Parker, Caleb M, MD  metoCLOPramide (REGLAN) 5 MG tablet Take 1 tablet (5 mg total) by mouth in the morning and at bedtime. Please keep your January appt for further refills. Thank you 03/28/20  Yes Armbruster, Steven P, MD  mirtazapine (REMERON) 30 MG tablet TAKE 1 TABLET BY MOUTH AT  BEDTIME Patient taking differently: Take 30 mg by mouth at bedtime. 01/17/20  Yes Parker, Caleb M, MD  oxyCODONE (ROXICODONE) 5 MG immediate release tablet Take 1 tablet (5 mg total) by mouth every 6 (six) hours as needed for severe pain. 05/31/20  Yes Persons, Mary Anne, PA  pantoprazole (PROTONIX) 40 MG tablet Take 1 tablet (40 mg total) by mouth 2 (two) times  daily. 12/06/19  Yes Armbruster, Steven P, MD  silver sulfADIAZINE (SILVADENE) 1 % cream Apply 1 application topically daily. Apply small amount to foot wound daily cover with dry dressing 05/24/20  Yes Persons, Mary Anne, PA  Continuous Blood Gluc Sensor (DEXCOM G6 SENSOR) MISC USE AS DIRECTED WITH CONTINUOUS BLOOD GLUCOSE MONITOR. REPLACE EVERY 10 DAYS 02/27/20   Ellison, Sean, MD  Continuous Blood Gluc Transmit (DEXCOM G6 TRANSMITTER) MISC USE AS DIRECTED TO  MONITOR  GLUCOSE  LEVELS 05/10/20   Ellison, Sean, MD  gabapentin (NEURONTIN) 300 MG capsule Take 1 capsule (300 mg total) by mouth at bedtime. Patient not taking: Reported on 06/04/2020 04/20/20   Parker, Caleb M, MD  Insulin NPH, Human,, Isophane, (HUMULIN N KWIKPEN) 100 UNIT/ML Kiwkpen Inject 10 Units into the skin 2 (two) times daily at 8 am and 10 pm. Patient not taking: Reported on 06/04/2020 05/24/20   Gonfa, Taye T, MD  ondansetron (ZOFRAN ODT) 4 MG disintegrating tablet Take 1 tablet (4 mg total) by mouth every 8 (eight) hours as needed for nausea or vomiting. 04/06/20   Kim, Hannah R, DO  potassium chloride SA (KLOR-CON) 20 MEQ tablet Take 1 tablet (20 mEq total) by mouth daily for 5 doses. 07/04/19 07/30/19  Soto, Johana, PA-C   CT Angio Chest PE W/Cm &/Or Wo Cm  Result Date: 06/04/2020 CLINICAL DATA:  Chest pain. Shortness of breath. Pleurisy versus pleural effusion. EXAM: CT ANGIOGRAPHY CHEST WITH CONTRAST TECHNIQUE: Multidetector CT imaging of the chest was performed using the standard protocol during bolus administration of intravenous contrast. Multiplanar CT image reconstructions and MIPs were obtained to evaluate the vascular anatomy. CONTRAST:  75mL OMNIPAQUE IOHEXOL 300 MG/ML SOLN, 75mL OMNIPAQUE IOHEXOL 350 MG/ML SOLN COMPARISON:  Chest radiograph-06/04/2020; 05/21/2020; 05/18/2020 FINDINGS: Vascular Findings: There is adequate opacification of the pulmonary arterial system with the main pulmonary artery measuring 248 Hounsfield units. There  are no discrete filling defects within the pulmonary arterial tree to the level of the bilateral subsegmental pulmonary arteries. Evaluation of the distal subsegmental pulmonary arteries is degraded secondary to patient respiratory artifact. Normal caliber of the main pulmonary artery. Borderline cardiomegaly.  No pericardial effusion. No evidence of thoracic aortic aneurysm or dissection. Bovine configuration of the aortic arch. The branch vessels of the aortic arch appear widely patent throughout   their imaged courses. The descending thoracic aorta is of normal caliber and widely patent without hemodynamically significant narrowing. Right upper extremity PICC line tip terminates within the superior cavoatrial junction. Review of the MIP images confirms the above findings. ---------------------------------------------------------------------------------- Nonvascular Findings: Mediastinum/Lymph Nodes: Mediastinal, bilateral hilar and axillary lymph nodes are prominent though individually not enlarged by size criteria with index left axillary lymph node measuring 0.8 cm in greatest short axis diameter (image 21, series 4) and index right axillary lymph node measuring 0.6 cm (image 45), presumably reactive in etiology. Lungs/Pleura: Small to moderate sized right-sided pleural effusion with trace left-sided pleural effusion. Extensive bilateral slightly nodular heterogeneous and airspace opacities with diffuse interlobular septal thickening. Bibasilar subpleural consolidative opacities. Relative areas of confluence are seen within the superior segment of the left lower lobe (image 50, series 6), with associated air bronchograms. The central pulmonary airways appear patent.  No pneumothorax. Upper abdomen: Limited early arterial phase evaluation of the upper abdomen is normal. Musculoskeletal: Diffuse body wall anasarca. No acute or aggressive osseous abnormalities. There is a punctate bone island within the T6 vertebral  body. IMPRESSION: 1. No evidence of pulmonary embolism. 2. Small to moderate-sized right-sided pleural effusion and trace left-sided pleural effusion. 3. Extensive bilateral airspace opacities and interstitial thickening, nonspecific with broad differential considerations including multi lobar infection (including atypical etiology) and pulmonary edema. 4. Borderline cardiomegaly. 5. Diffuse body wall anasarca. 6. Prominent though non pathologically enlarged mediastinal, bilateral hilar and axillary lymph nodes, nonspecific though presumably reactive in etiology. Electronically Signed   By: John  Watts M.D.   On: 06/04/2020 07:50   CT FOOT RIGHT W CONTRAST  Result Date: 06/04/2020 CLINICAL DATA:  29-year-old female with diabetes and foot swelling EXAM: CT OF THE LOWER RIGHT EXTREMITY WITH CONTRAST TECHNIQUE: Multidetector CT imaging of the lower right extremity was performed according to the standard protocol following intravenous contrast administration. CONTRAST:  75mL OMNIPAQUE IOHEXOL 300 MG/ML SOLN, 75mL OMNIPAQUE IOHEXOL 350 MG/ML SOLN COMPARISON:  X-ray 06/04/2020, MRI 04/10/2020, 04/30/2020 FINDINGS: Surgical wound at the medial aspect of the forefoot related to recent first ray resection. Erosive changes within the distal aspect of the medial cuneiform is highly suspicious for osteomyelitis (series 3, images 45-48). Redemonstration of Lisfranc fracture dislocation with dorsal and lateral dislocation of the second metatarsal base at the second TMT joint. There is also dorsal subluxation of the third metatarsal base at the third TMT joint. Numerous small fractures at the TMT joints including within the second and third metatarsal bases as well as at the medial and intermediate cuneiform bones. There is also irregularity at the dorsal aspect of the distal cuboid. Scattered areas of heterotopic bone formation at the TMT joints. Bones are demineralized. Partially visualized hardware at the medial malleolus  and distal fibula. Soft tissue thickening at the medial aspect of the forefoot. No organized or rim enhancing fluid collection. Denervation changes of the intrinsic foot musculature. No significant tenosynovial fluid. IMPRESSION: 1. Surgical wound at the medial aspect of the forefoot related to recent first ray resection. Erosive changes within the distal aspect of the medial cuneiform is highly suspicious for osteomyelitis. 2. Redemonstration of Lisfranc fracture-dislocation with dorsal and lateral dislocation of the second metatarsal base at the second TMT joint. There is also dorsal subluxation of the third metatarsal base at the third TMT joint. 3. Numerous fractures at the TMT joints including within the second and third metatarsal bases as well as at the medial and intermediate cuneiform bones, and distal cuboid. 4.   No organized or rim enhancing fluid collection. Electronically Signed   By: Nicholas  Plundo D.O.   On: 06/04/2020 08:22   DG Chest Port 1 View  Result Date: 06/04/2020 CLINICAL DATA:  Sepsis EXAM: PORTABLE CHEST 1 VIEW COMPARISON:  05/21/2020 FINDINGS: Right upper extremity PICC line tip within the superior vena cava. Diffuse asymmetric airspace infiltrate has improved, particularly within the right upper lobe, though persists in keeping with multifocal pneumonia. No pneumothorax or pleural effusion. Interval development of right lower lobe collapse. Cardiac size is mildly enlarged, unchanged. No acute bone abnormality. IMPRESSION: Interval development of right lower lobe collapse. Improving multifocal pulmonary infiltrates, most in keeping with multifocal pneumonia. Stable cardiomegaly Electronically Signed   By: Ashesh  Parikh MD   On: 06/04/2020 04:19   DG Foot Complete Right  Result Date: 06/04/2020 CLINICAL DATA:  Sepsis EXAM: RIGHT FOOT COMPLETE - 3+ VIEW COMPARISON:  None. FINDINGS: Three view radiograph of the right foot demonstrates surgical changes of first ray amputation distal to  the a medial cuneiform. There is extensive soft tissue swelling of the right forefoot as well as a large open defect involving the medial aspect of the right forefoot in keeping with dehiscence of the surgical wound. Additionally, there has developed a fracture of the base of the second metatarsal with lateral subluxation of the second and likely the third and fourth metatarsal shafts in keeping with a Lisfranc fracture dislocation. ORIF of the medial malleolus utilizing a single partially threaded screw and distal fibular ORIF utilizing lateral plate and screws are partially visualized. Advanced vascular calcifications are seen within the soft tissues. IMPRESSION: Open wound involving the site of right first ray amputation with extensive soft tissue swelling of the right forefoot in keeping with dehiscence of the surgical wound and probable superimposed cellulitis. Interval development of a Lisfranc fracture dislocation with mild lateral subluxation of the second, third, and fourth metatarsals. This may be better assessed with CT imaging. Electronically Signed   By: Ashesh  Parikh MD   On: 06/04/2020 04:26   - pertinent xrays, CT, MRI studies were reviewed and independently interpreted  Positive ROS: All other systems have been reviewed and were otherwise negative with the exception of those mentioned in the HPI and as above.  Physical Exam: General: Alert, no acute distress Psychiatric: Patient is competent for consent with normal mood and affect Lymphatic: No axillary or cervical lymphadenopathy Cardiovascular: No pedal edema Respiratory: No cyanosis, no use of accessory musculature GI: No organomegaly, abdomen is soft and non-tender    Images:  @ENCIMAGES@  Labs:  Lab Results  Component Value Date   HGBA1C 6.9 (H) 06/04/2020   HGBA1C 8.6 (H) 04/10/2020   HGBA1C 9.0 (A) 02/29/2020   ESRSEDRATE 127 (H) 05/13/2020   ESRSEDRATE >140 (H) 04/09/2020   ESRSEDRATE 125 (H) 03/23/2017   CRP  18.8 (H) 05/18/2020   CRP 42.4 (H) 05/14/2020   CRP 30.1 (H) 05/13/2020   LABURIC 6.8 (H) 01/23/2015   REPTSTATUS 06/05/2020 FINAL 06/04/2020   GRAMSTAIN  05/13/2020    MODERATE WBC PRESENT, PREDOMINANTLY PMN NO ORGANISMS SEEN    CULT  06/04/2020    NO GROWTH Performed at [facility], [address] [place], [place] [address]    LABORGA KLEBSIELLA PNEUMONIAE 03/01/2017    Lab Results  Component Value Date   ALBUMIN 1.6 (L) 06/05/2020   ALBUMIN 1.7 (L) 06/04/2020   ALBUMIN 2.0 (L) 06/04/2020   PREALBUMIN 17.9 (L) 06/09/2018   PREALBUMIN 21.0 02/18/2018     PREALBUMIN 18.1 03/18/2017   LABURIC 6.8 (H) 01/23/2015    Neurologic: Patient does not have protective sensation bilateral lower extremities.   MUSCULOSKELETAL:   Skin: Examination the wound bed has 50% healthy granulation tissue 50% very thin fibrinous tissue there is no ischemic changes.  There is a tunneling sinus tract that extends down to the midfoot.  Patient has severe protein caloric malnutrition with an albumin of 1.6 and type 2 diabetes with hemoglobin A1c ranging from 9-6.9.  Ankle-brachial indices shows noncompressible arterial flow on the right.  The waveform is triphasic on the left that is noncompressible waveform on the right is biphasic for the posterior tibial and monophasic for the dorsalis pedis she does have a great toe pressure that has a normal waveform.  Assessment: Assessment: Progressive osteomyelitis right midfoot status post limb salvage intervention with a first ray amputation wound care with antibiotics with diabetic insensate neuropathy severe protein caloric malnutrition and noncompressible arteries to the foot.  Plan: With the infection extending down to the midfoot patient does not have foot salvage intervention options.  With failure of 2 months of conservative therapy patient agrees to proceed with a transtibial amputation on the right tomorrow.  Risks and benefits were discussed  including the risk of the wound not healing.  Patient states she understands wished to proceed at this time.  Thank you for the consult and the opportunity to see Ms. [place], MD [facility] [phone] 8:36 AM

## 2020-06-05 NOTE — Telephone Encounter (Signed)
Home Health Certification or Plan of Care Tracking  This is a Certification and Plan of Care  [facility] Agency: [facility]   Order Number:  [id number]   Where has form been placed:  Placed in folder up front

## 2020-06-05 NOTE — H&P (View-Only) (Signed)
  ORTHOPAEDIC CONSULTATION  REQUESTING PHYSICIAN: Ghimire, Shanker M, MD  Chief Complaint: Painful ulceration right foot.  HPI: [name] is a 30 y.o. female who presents with nonhealing right foot first ray amputation.  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  Patient has undergone wound care and has improved granulation tissue around the chronic draining sinus tract but still has a deep infection.  Past Medical History:  Diagnosis Date  . Allergy   . Anemia   . Anxiety   . Blood transfusion without reported diagnosis    Dec 2018  . Cataract    right eye  . COVID-19 03/2020  . Depression   . Diabetes type 1, uncontrolled (HCC) 11/14/2011   Since age 10  . Fibromyalgia   . Gastroparesis   . GERD (gastroesophageal reflux disease)   . Hypertension   . Infection    UTI April 2016   Past Surgical History:  Procedure Laterality Date  . AMPUTATION Right 04/13/2020   Procedure: 1ST RAY AMPUTATION RIGHT FOOT;  Surgeon: ,  V, MD;  Location: [facility];  Service: Orthopedics;  Laterality: Right;  . ANKLE SURGERY    . CHOLECYSTECTOMY  11/15/2011   Procedure: LAPAROSCOPIC CHOLECYSTECTOMY WITH INTRAOPERATIVE CHOLANGIOGRAM;  Surgeon: Steven C. Gross, MD;  Location: [facility];  Service: General;  Laterality: N/A;  . COLONOSCOPY    . COLONOSCOPY WITH PROPOFOL N/A 06/27/2017   Procedure: COLONOSCOPY WITH PROPOFOL;  Surgeon: Jacobs, Daniel P, MD;  Location: [facility];  Service: Endoscopy;  Laterality: N/A;  . ESOPHAGOGASTRODUODENOSCOPY  12/03/2011   Procedure: ESOPHAGOGASTRODUODENOSCOPY (EGD);  Surgeon: Patrick D Hung, MD;  Location: [facility];  Service: Endoscopy;  Laterality: N/A;  . FLEXIBLE SIGMOIDOSCOPY N/A 03/10/2017   Procedure: FLEXIBLE SIGMOIDOSCOPY;  Surgeon: Hung, Patrick, MD;  Location: [facility];  Service: Endoscopy;  Laterality: N/A;  . INCISION AND DRAINAGE PERIRECTAL ABSCESS N/A 03/01/2017   Procedure:  IRRIGATION AND DEBRIDEMENT PERIRECTAL ABSCESS;  Surgeon: Newman, David, MD;  Location: [facility];  Service: General;  Laterality: N/A;  . IRRIGATION AND DEBRIDEMENT BUTTOCKS N/A 03/23/2017   Procedure: IRRIGATION AND DEBRIDEMENT BUTTOCKS, SETON PLACEMENT;  Surgeon: Thomas, Albie, MD;  Location: [facility];  Service: General;  Laterality: N/A;  . LAPAROSCOPY  11/23/2011   Procedure: LAPAROSCOPY DIAGNOSTIC;  Surgeon: Benjamin T Hoxworth, MD;  Location: [facility];  Service: General;  Laterality: N/A;  . SIGMOIDOSCOPY    . UPPER GASTROINTESTINAL ENDOSCOPY    . WISDOM TOOTH EXTRACTION     Social History   Socioeconomic History  . Marital status: Married    Spouse name: sergio  . Number of children: 0  . Years of education: college  . Highest education level: Not on file  Occupational History  . Occupation: [employer] call center    Employer: [employer]  Tobacco Use  . Smoking status: Never Smoker  . Smokeless tobacco: Never Used  Vaping Use  . Vaping Use: Never used  Substance and Sexual Activity  . Alcohol use: No    Alcohol/week: 0.0 standard drinks  . Drug use: Yes    Types: Marijuana  . Sexual activity: Not Currently    Partners: Male    Birth control/protection: None  Other Topics Concern  . Not on file  Social History Narrative  . Not on file   Social Determinants of Health   Financial Resource Strain: Not on file  Food Insecurity: Food Insecurity Present  . Worried About Running Out   of Food in the Last Year: Sometimes true  . Ran Out of Food in the Last Year: Sometimes true  Transportation Needs: No Transportation Needs  . Lack of Transportation (Medical): No  . Lack of Transportation (Non-Medical): No  Physical Activity: Not on file  Stress: Not on file  Social Connections: Not on file   Family History  Problem Relation Age of Onset  . Diabetes Mother   . Hypertension Father   . Colon cancer Paternal Grandmother        pt thinks PGM was dx in her 50's  .  Diabetes Paternal Grandmother   . Diabetes Maternal Grandmother   . Diabetes Maternal Grandfather   . Diabetes Paternal Grandfather   . Diabetes Other   . Breast cancer Paternal Aunt   . Esophageal cancer Neg Hx   . Liver cancer Neg Hx   . Pancreatic cancer Neg Hx   . Stomach cancer Neg Hx   . Rectal cancer Neg Hx    - negative except otherwise stated in the family history section Allergies  Allergen Reactions  . Other Anaphylaxis    Reaction to brazil nuts   . Lactose Intolerance (Gi) Diarrhea   Prior to Admission medications   Medication Sig Start Date End Date Taking? Authorizing Provider  acetaminophen (TYLENOL) 500 MG tablet Take 1 tablet (500 mg total) by mouth every 8 (eight) hours. 05/24/20 06/23/20 Yes Gonfa, Taye T, MD  busPIRone (BUSPAR) 5 MG tablet Take 1 tablet (5 mg total) by mouth 3 (three) times daily. 02/03/20  Yes Parker, Caleb M, MD  carvedilol (COREG) 12.5 MG tablet Take 1 tablet (12.5 mg total) by mouth 2 (two) times daily with a meal. 04/16/20 07/15/20 Yes Dahal, Binaya, MD  cefTRIAXone (ROCEPHIN) IVPB Inject 2 g into the vein daily for 21 days. Indication:  Wound infection/osteo right foot First Dose: Yes Last Day of Therapy:  06/13/20 Labs - Once weekly:  CBC/D and BMP, Labs - Every other week:  ESR and CRP Method of administration: IV Push Method of administration may be changed at the discretion of home infusion pharmacist based upon assessment of the patient and/or caregiver's ability to self-administer the medication ordered. 05/24/20 06/14/20 Yes Gonfa, Taye T, MD  cyclobenzaprine (FLEXERIL) 10 MG tablet TAKE 1 TABLET(10 MG) BY MOUTH THREE TIMES DAILY AS NEEDED FOR MUSCLE SPASMS Patient taking differently: Take 10 mg by mouth 3 (three) times daily as needed for muscle spasms. 04/02/20  Yes Parker, Caleb M, MD  daptomycin (CUBICIN) IVPB Inject 500 mg into the vein daily for 21 days. Indication:  Wound infection/osteo right foot  First Dose: Yes Last Day of  Therapy:  06/13/20 Labs - Once weekly:  CBC/D, BMP, and CPK Labs - Every other week:  ESR and CRP Method of administration: IV Push Method of administration may be changed at the discretion of home infusion pharmacist based upon assessment of the patient and/or caregiver's ability to self-administer the medication ordered. 05/24/20 06/14/20 Yes Gonfa, Taye T, MD  dicyclomine (BENTYL) 20 MG tablet Take 1 tablet (20 mg total) by mouth every 8 (eight) hours as needed for spasms. 11/25/19  Yes Petrucelli, Samantha R, PA-C  furosemide (LASIX) 40 MG tablet Take 1 tablet (40 mg total) by mouth daily. 05/24/20 11/20/20 Yes Gonfa, Taye T, MD  insulin glargine (LANTUS) 100 UNIT/ML injection Inject 10 Units into the skin 2 (two) times daily. Morning & bedtime   Yes [provider]  insulin lispro (HUMALOG) 100 UNIT/ML injection Inject   0-5 Units into the skin 3 (three) times daily before meals. Sliding scale   Yes [provider]  loperamide (IMODIUM) 2 MG capsule Take 6 mg by mouth daily as needed for diarrhea or loose stools.    Yes [provider]  LORazepam (ATIVAN) 0.5 MG tablet TAKE 1 TABLET(0.5 MG) BY MOUTH TWICE DAILY AS NEEDED FOR ANXIETY Patient taking differently: Take 0.5 mg by mouth 2 (two) times daily as needed for anxiety. 03/07/20  Yes [name], MD  metoCLOPramide (REGLAN) 5 MG tablet Take 1 tablet (5 mg total) by mouth in the morning and at bedtime. Please keep your January appt for further refills. Thank you 03/28/20  Yes Armbruster, Carlota Raspberry, MD  mirtazapine (REMERON) 30 MG tablet TAKE 1 TABLET BY MOUTH AT  BEDTIME Patient taking differently: Take 30 mg by mouth at bedtime. 01/17/20  Yes [name], MD  oxyCODONE (ROXICODONE) 5 MG immediate release tablet Take 1 tablet (5 mg total) by mouth every 6 (six) hours as needed for severe pain. 05/31/20  Yes Persons, Bevely Palmer, PA  pantoprazole (PROTONIX) 40 MG tablet Take 1 tablet (40 mg total) by mouth 2 (two) times  daily. 12/06/19  Yes Armbruster, Carlota Raspberry, MD  silver sulfADIAZINE (SILVADENE) 1 % cream Apply 1 application topically daily. Apply small amount to foot wound daily cover with dry dressing 05/24/20  Yes Persons, Bevely Palmer, PA  Continuous Blood Gluc Sensor (DEXCOM G6 SENSOR) MISC USE AS DIRECTED WITH CONTINUOUS BLOOD GLUCOSE MONITOR. REPLACE EVERY 10 DAYS 02/27/20   Renato Shin, MD  Continuous Blood Gluc Transmit (DEXCOM G6 TRANSMITTER) MISC USE AS DIRECTED TO  MONITOR  GLUCOSE  LEVELS 05/10/20   Renato Shin, MD  gabapentin (NEURONTIN) 300 MG capsule Take 1 capsule (300 mg total) by mouth at bedtime. Patient not taking: Reported on 06/04/2020 04/20/20   [name], MD  Insulin NPH, Human,, Isophane, (HUMULIN N KWIKPEN) 100 UNIT/ML Kiwkpen Inject 10 Units into the skin 2 (two) times daily at 8 am and 10 pm. Patient not taking: Reported on 06/04/2020 05/24/20   Mercy Riding, MD  ondansetron (ZOFRAN ODT) 4 MG disintegrating tablet Take 1 tablet (4 mg total) by mouth every 8 (eight) hours as needed for nausea or vomiting. 04/06/20   Lucretia Kern, DO  potassium chloride SA (KLOR-CON) 20 MEQ tablet Take 1 tablet (20 mEq total) by mouth daily for 5 doses. 07/04/19 07/30/19  Janeece Fitting, PA-C   CT Angio Chest PE W/Cm &/Or Wo Cm  Result Date: 06/04/2020 CLINICAL DATA:  Chest pain. Shortness of breath. Pleurisy versus pleural effusion. EXAM: CT ANGIOGRAPHY CHEST WITH CONTRAST TECHNIQUE: Multidetector CT imaging of the chest was performed using the standard protocol during bolus administration of intravenous contrast. Multiplanar CT image reconstructions and MIPs were obtained to evaluate the vascular anatomy. CONTRAST:  53m OMNIPAQUE IOHEXOL 300 MG/ML SOLN, 784mOMNIPAQUE IOHEXOL 350 MG/ML SOLN COMPARISON:  Chest radiograph-06/04/2020; 05/21/2020; 05/18/2020 FINDINGS: Vascular Findings: There is adequate opacification of the pulmonary arterial system with the main pulmonary artery measuring 248 Hounsfield units. There  are no discrete filling defects within the pulmonary arterial tree to the level of the bilateral subsegmental pulmonary arteries. Evaluation of the distal subsegmental pulmonary arteries is degraded secondary to patient respiratory artifact. Normal caliber of the main pulmonary artery. Borderline cardiomegaly.  No pericardial effusion. No evidence of thoracic aortic aneurysm or dissection. Bovine configuration of the aortic arch. The branch vessels of the aortic arch appear widely patent throughout  their imaged courses. The descending thoracic aorta is of normal caliber and widely patent without hemodynamically significant narrowing. Right upper extremity PICC line tip terminates within the superior cavoatrial junction. Review of the MIP images confirms the above findings. ---------------------------------------------------------------------------------- Nonvascular Findings: Mediastinum/Lymph Nodes: Mediastinal, bilateral hilar and axillary lymph nodes are prominent though individually not enlarged by size criteria with index left axillary lymph node measuring 0.8 cm in greatest short axis diameter (image 21, series 4) and index right axillary lymph node measuring 0.6 cm (image 45), presumably reactive in etiology. Lungs/Pleura: Small to moderate sized right-sided pleural effusion with trace left-sided pleural effusion. Extensive bilateral slightly nodular heterogeneous and airspace opacities with diffuse interlobular septal thickening. Bibasilar subpleural consolidative opacities. Relative areas of confluence are seen within the superior segment of the left lower lobe (image 50, series 6), with associated air bronchograms. The central pulmonary airways appear patent.  No pneumothorax. Upper abdomen: Limited early arterial phase evaluation of the upper abdomen is normal. Musculoskeletal: Diffuse body wall anasarca. No acute or aggressive osseous abnormalities. There is a punctate bone island within the T6 vertebral  body. IMPRESSION: 1. No evidence of pulmonary embolism. 2. Small to moderate-sized right-sided pleural effusion and trace left-sided pleural effusion. 3. Extensive bilateral airspace opacities and interstitial thickening, nonspecific with broad differential considerations including multi lobar infection (including atypical etiology) and pulmonary edema. 4. Borderline cardiomegaly. 5. Diffuse body wall anasarca. 6. Prominent though non pathologically enlarged mediastinal, bilateral hilar and axillary lymph nodes, nonspecific though presumably reactive in etiology. Electronically Signed   By: Sandi Mariscal M.D.   On: 06/04/2020 07:50   CT FOOT RIGHT W CONTRAST  Result Date: 06/04/2020 CLINICAL DATA:  30 year old female with diabetes and foot swelling EXAM: CT OF THE LOWER RIGHT EXTREMITY WITH CONTRAST TECHNIQUE: Multidetector CT imaging of the lower right extremity was performed according to the standard protocol following intravenous contrast administration. CONTRAST:  84m OMNIPAQUE IOHEXOL 300 MG/ML SOLN, 791mOMNIPAQUE IOHEXOL 350 MG/ML SOLN COMPARISON:  X-ray 06/04/2020, MRI 04/10/2020, 04/30/2020 FINDINGS: Surgical wound at the medial aspect of the forefoot related to recent first ray resection. Erosive changes within the distal aspect of the medial cuneiform is highly suspicious for osteomyelitis (series 3, images 45-48). Redemonstration of Lisfranc fracture dislocation with dorsal and lateral dislocation of the second metatarsal base at the second TMT joint. There is also dorsal subluxation of the third metatarsal base at the third TMT joint. Numerous small fractures at the TMT joints including within the second and third metatarsal bases as well as at the medial and intermediate cuneiform bones. There is also irregularity at the dorsal aspect of the distal cuboid. Scattered areas of heterotopic bone formation at the TMT joints. Bones are demineralized. Partially visualized hardware at the medial malleolus  and distal fibula. Soft tissue thickening at the medial aspect of the forefoot. No organized or rim enhancing fluid collection. Denervation changes of the intrinsic foot musculature. No significant tenosynovial fluid. IMPRESSION: 1. Surgical wound at the medial aspect of the forefoot related to recent first ray resection. Erosive changes within the distal aspect of the medial cuneiform is highly suspicious for osteomyelitis. 2. Redemonstration of Lisfranc fracture-dislocation with dorsal and lateral dislocation of the second metatarsal base at the second TMT joint. There is also dorsal subluxation of the third metatarsal base at the third TMT joint. 3. Numerous fractures at the TMT joints including within the second and third metatarsal bases as well as at the medial and intermediate cuneiform bones, and distal cuboid. 4.  No organized or rim enhancing fluid collection. Electronically Signed   By: Nicholas  Plundo D.O.   On: 06/04/2020 08:22   DG Chest Port 1 View  Result Date: 06/04/2020 CLINICAL DATA:  Sepsis EXAM: PORTABLE CHEST 1 VIEW COMPARISON:  05/21/2020 FINDINGS: Right upper extremity PICC line tip within the superior vena cava. Diffuse asymmetric airspace infiltrate has improved, particularly within the right upper lobe, though persists in keeping with multifocal pneumonia. No pneumothorax or pleural effusion. Interval development of right lower lobe collapse. Cardiac size is mildly enlarged, unchanged. No acute bone abnormality. IMPRESSION: Interval development of right lower lobe collapse. Improving multifocal pulmonary infiltrates, most in keeping with multifocal pneumonia. Stable cardiomegaly Electronically Signed   By: Ashesh  Parikh MD   On: 06/04/2020 04:19   DG Foot Complete Right  Result Date: 06/04/2020 CLINICAL DATA:  Sepsis EXAM: RIGHT FOOT COMPLETE - 3+ VIEW COMPARISON:  None. FINDINGS: Three view radiograph of the right foot demonstrates surgical changes of first ray amputation distal to  the a medial cuneiform. There is extensive soft tissue swelling of the right forefoot as well as a large open defect involving the medial aspect of the right forefoot in keeping with dehiscence of the surgical wound. Additionally, there has developed a fracture of the base of the second metatarsal with lateral subluxation of the second and likely the third and fourth metatarsal shafts in keeping with a Lisfranc fracture dislocation. ORIF of the medial malleolus utilizing a single partially threaded screw and distal fibular ORIF utilizing lateral plate and screws are partially visualized. Advanced vascular calcifications are seen within the soft tissues. IMPRESSION: Open wound involving the site of right first ray amputation with extensive soft tissue swelling of the right forefoot in keeping with dehiscence of the surgical wound and probable superimposed cellulitis. Interval development of a Lisfranc fracture dislocation with mild lateral subluxation of the second, third, and fourth metatarsals. This may be better assessed with CT imaging. Electronically Signed   By: Ashesh  Parikh MD   On: 06/04/2020 04:26   - pertinent xrays, CT, MRI studies were reviewed and independently interpreted  Positive ROS: All other systems have been reviewed and were otherwise negative with the exception of those mentioned in the HPI and as above.  Physical Exam: General: Alert, no acute distress Psychiatric: Patient is competent for consent with normal mood and affect Lymphatic: No axillary or cervical lymphadenopathy Cardiovascular: No pedal edema Respiratory: No cyanosis, no use of accessory musculature GI: No organomegaly, abdomen is soft and non-tender    Images:  @ENCIMAGES@  Labs:  Lab Results  Component Value Date   HGBA1C 6.9 (H) 06/04/2020   HGBA1C 8.6 (H) 04/10/2020   HGBA1C 9.0 (A) 02/29/2020   ESRSEDRATE 127 (H) 05/13/2020   ESRSEDRATE >140 (H) 04/09/2020   ESRSEDRATE 125 (H) 03/23/2017   CRP  18.8 (H) 05/18/2020   CRP 42.4 (H) 05/14/2020   CRP 30.1 (H) 05/13/2020   LABURIC 6.8 (H) 01/23/2015   REPTSTATUS 06/05/2020 FINAL 06/04/2020   GRAMSTAIN  05/13/2020    MODERATE WBC PRESENT, PREDOMINANTLY PMN NO ORGANISMS SEEN    CULT  06/04/2020    NO GROWTH Performed at [facility], [address] [place], [place] [address]    LABORGA KLEBSIELLA PNEUMONIAE 03/01/2017    Lab Results  Component Value Date   ALBUMIN 1.6 (L) 06/05/2020   ALBUMIN 1.7 (L) 06/04/2020   ALBUMIN 2.0 (L) 06/04/2020   PREALBUMIN 17.9 (L) 06/09/2018   PREALBUMIN 21.0 02/18/2018     PREALBUMIN 18.1 03/18/2017   LABURIC 6.8 (H) 01/23/2015    Neurologic: Patient does not have protective sensation bilateral lower extremities.   MUSCULOSKELETAL:   Skin: Examination the wound bed has 50% healthy granulation tissue 50% very thin fibrinous tissue there is no ischemic changes.  There is a tunneling sinus tract that extends down to the midfoot.  Patient has severe protein caloric malnutrition with an albumin of 1.6 and type 2 diabetes with hemoglobin A1c ranging from 9-6.9.  Ankle-brachial indices shows noncompressible arterial flow on the right.  The waveform is triphasic on the left that is noncompressible waveform on the right is biphasic for the posterior tibial and monophasic for the dorsalis pedis she does have a great toe pressure that has a normal waveform.  Assessment: Assessment: Progressive osteomyelitis right midfoot status post limb salvage intervention with a first ray amputation wound care with antibiotics with diabetic insensate neuropathy severe protein caloric malnutrition and noncompressible arteries to the foot.  Plan: With the infection extending down to the midfoot patient does not have foot salvage intervention options.  With failure of 2 months of conservative therapy patient agrees to proceed with a transtibial amputation on the right tomorrow.  Risks and benefits were discussed  including the risk of the wound not healing.  Patient states she understands wished to proceed at this time.  Thank you for the consult and the opportunity to see Ms. Sandy Ridge, MD [facility] [place] [phone] 8:36 AM

## 2020-06-06 ENCOUNTER — Encounter (HOSPITAL_COMMUNITY): Payer: Self-pay | Admitting: Internal Medicine

## 2020-06-06 ENCOUNTER — Inpatient Hospital Stay (HOSPITAL_COMMUNITY): Payer: 59 | Admitting: Certified Registered Nurse Anesthetist

## 2020-06-06 ENCOUNTER — Encounter (HOSPITAL_COMMUNITY)
Admission: EM | Disposition: A | Discharge status: Home Health | Payer: Self-pay | Source: Home / Self Care | Attending: Internal Medicine

## 2020-06-06 ENCOUNTER — Telehealth: Payer: Self-pay | Admitting: Orthopedic Surgery

## 2020-06-06 DIAGNOSIS — J9611 Chronic respiratory failure with hypoxia: Secondary | ICD-10-CM

## 2020-06-06 DIAGNOSIS — M86271 Subacute osteomyelitis, right ankle and foot: Secondary | ICD-10-CM | POA: Diagnosis not present

## 2020-06-06 DIAGNOSIS — T148XXA Other injury of unspecified body region, initial encounter: Secondary | ICD-10-CM | POA: Diagnosis not present

## 2020-06-06 DIAGNOSIS — E1142 Type 2 diabetes mellitus with diabetic polyneuropathy: Secondary | ICD-10-CM | POA: Diagnosis not present

## 2020-06-06 DIAGNOSIS — E1165 Type 2 diabetes mellitus with hyperglycemia: Secondary | ICD-10-CM | POA: Diagnosis not present

## 2020-06-06 HISTORY — PX: APPLICATION OF WOUND VAC: SHX5189

## 2020-06-06 HISTORY — PX: AMPUTATION: SHX166

## 2020-06-06 LAB — COMPREHENSIVE METABOLIC PANEL
ALT: 38 U/L (ref 0–44)
AST: 24 U/L (ref 15–41)
Albumin: 1.6 g/dL — ABNORMAL LOW (ref 3.5–5.0)
Alkaline Phosphatase: 215 U/L — ABNORMAL HIGH (ref 38–126)
Anion gap: 7 (ref 5–15)
BUN: 29 mg/dL — ABNORMAL HIGH (ref 6–20)
CO2: 21 mmol/L — ABNORMAL LOW (ref 22–32)
Calcium: 7.8 mg/dL — ABNORMAL LOW (ref 8.9–10.3)
Chloride: 102 mmol/L (ref 98–111)
Creatinine, Ser: 1.48 mg/dL — ABNORMAL HIGH (ref 0.44–1.00)
GFR, Estimated: 49 mL/min — ABNORMAL LOW (ref >60)
Glucose, Bld: 350 mg/dL — ABNORMAL HIGH (ref 70–99)
Potassium: 4.5 mmol/L (ref 3.5–5.1)
Sodium: 130 mmol/L — ABNORMAL LOW (ref 135–145)
Total Bilirubin: 0.4 mg/dL (ref 0.3–1.2)
Total Protein: 6.1 g/dL — ABNORMAL LOW (ref 6.5–8.1)

## 2020-06-06 LAB — CBC
HCT: 26.4 % — ABNORMAL LOW (ref 36.0–46.0)
Hemoglobin: 8.5 g/dL — ABNORMAL LOW (ref 12.0–15.0)
MCH: 29 pg (ref 26.0–34.0)
MCHC: 32.2 g/dL (ref 30.0–36.0)
MCV: 90.1 fL (ref 80.0–100.0)
Platelets: 202 10*3/uL (ref 150–400)
RBC: 2.93 MIL/uL — ABNORMAL LOW (ref 3.87–5.11)
RDW: 15.6 % — ABNORMAL HIGH (ref 11.5–15.5)
WBC: 6.5 10*3/uL (ref 4.0–10.5)
nRBC: 0 % (ref 0.0–0.2)

## 2020-06-06 LAB — BPAM RBC
Blood Product Expiration Date: 202204052359
ISSUE DATE / TIME: 202203081347
Unit Type and Rh: 5100

## 2020-06-06 LAB — TYPE AND SCREEN
ABO/RH(D): O POS
Antibody Screen: NEGATIVE
Unit division: 0

## 2020-06-06 LAB — GLUCOSE, CAPILLARY
Glucose-Capillary: 230 mg/dL — ABNORMAL HIGH (ref 70–99)
Glucose-Capillary: 61 mg/dL — ABNORMAL LOW (ref 70–99)
Glucose-Capillary: 43 mg/dL — CL (ref 70–99)
Glucose-Capillary: 72 mg/dL (ref 70–99)
Glucose-Capillary: 62 mg/dL — ABNORMAL LOW (ref 70–99)
Glucose-Capillary: 87 mg/dL (ref 70–99)
Glucose-Capillary: 126 mg/dL — ABNORMAL HIGH (ref 70–99)
Glucose-Capillary: 66 mg/dL — ABNORMAL LOW (ref 70–99)
Glucose-Capillary: 88 mg/dL (ref 70–99)
Glucose-Capillary: 63 mg/dL — ABNORMAL LOW (ref 70–99)
Glucose-Capillary: 63 mg/dL — ABNORMAL LOW (ref 70–99)
Glucose-Capillary: 109 mg/dL — ABNORMAL HIGH (ref 70–99)

## 2020-06-06 LAB — PROCALCITONIN: Procalcitonin: 0.19 ng/mL

## 2020-06-06 SURGERY — AMPUTATION BELOW KNEE
Anesthesia: Monitor Anesthesia Care | Site: Leg Lower | Laterality: Right

## 2020-06-06 MED ORDER — CHLORHEXIDINE GLUCONATE 0.12 % MT SOLN
OROMUCOSAL | Status: AC
  Administered 2020-06-06: 15 mL via OROMUCOSAL
  Filled 2020-06-06: qty 15

## 2020-06-06 MED ORDER — MORPHINE SULFATE (PF) 2 MG/ML IV SOLN
2.0000 mg | Freq: Once | INTRAVENOUS | Status: AC
  Administered 2020-06-06: 2 mg via INTRAVENOUS
  Filled 2020-06-06: qty 1

## 2020-06-06 MED ORDER — SODIUM CHLORIDE 0.9 % IV SOLN: INTRAVENOUS | Status: DC

## 2020-06-06 MED ORDER — MORPHINE SULFATE (PF) 2 MG/ML IV SOLN: 2.0000 mg | Freq: Once | INTRAVENOUS | Status: DC

## 2020-06-06 MED ORDER — LACTATED RINGERS IV SOLN: INTRAVENOUS | Status: DC

## 2020-06-06 MED ORDER — OXYCODONE HCL 5 MG/5ML PO SOLN: 5.0000 mg | Freq: Once | ORAL | Status: DC | PRN

## 2020-06-06 MED ORDER — PROPOFOL 500 MG/50ML IV EMUL
INTRAVENOUS | Status: DC | PRN
  Administered 2020-06-06: 100 ug/kg/min via INTRAVENOUS

## 2020-06-06 MED ORDER — ACETAMINOPHEN 10 MG/ML IV SOLN
1000.0000 mg | Freq: Once | INTRAVENOUS | Status: DC | PRN
  Administered 2020-06-06: 1000 mg via INTRAVENOUS

## 2020-06-06 MED ORDER — LIDOCAINE-EPINEPHRINE (PF) 1.5 %-1:200000 IJ SOLN
INTRAMUSCULAR | Status: DC | PRN
  Administered 2020-06-06 (×2): 5 mL via PERINEURAL

## 2020-06-06 MED ORDER — DEXTROSE 50 % IV SOLN
INTRAVENOUS | Status: AC
  Filled 2020-06-06: qty 50

## 2020-06-06 MED ORDER — ROPIVACAINE HCL 7.5 MG/ML IJ SOLN
INTRAMUSCULAR | Status: DC | PRN
  Administered 2020-06-06: 20 mL via PERINEURAL

## 2020-06-06 MED ORDER — ACETAMINOPHEN 10 MG/ML IV SOLN
INTRAVENOUS | Status: AC
  Filled 2020-06-06: qty 100

## 2020-06-06 MED ORDER — CHLORHEXIDINE GLUCONATE 0.12 % MT SOLN: 15.0000 mL | Freq: Once | OROMUCOSAL | Status: AC

## 2020-06-06 MED ORDER — ACETAMINOPHEN 160 MG/5ML PO SOLN: 1000.0000 mg | Freq: Once | ORAL | Status: DC | PRN

## 2020-06-06 MED ORDER — ONDANSETRON HCL 4 MG/2ML IJ SOLN
4.0000 mg | Freq: Four times a day (QID) | INTRAMUSCULAR | Status: DC | PRN
  Administered 2020-06-06 – 2020-06-07 (×2): 4 mg via INTRAVENOUS
  Filled 2020-06-06 (×2): qty 2

## 2020-06-06 MED ORDER — FENTANYL CITRATE (PF) 100 MCG/2ML IJ SOLN
INTRAMUSCULAR | Status: DC | PRN
  Administered 2020-06-06 (×5): 50 ug via INTRAVENOUS

## 2020-06-06 MED ORDER — MIDAZOLAM HCL 2 MG/2ML IJ SOLN
INTRAMUSCULAR | Status: AC
  Filled 2020-06-06: qty 2

## 2020-06-06 MED ORDER — DEXTROSE 50 % IV SOLN
INTRAVENOUS | Status: DC | PRN
  Administered 2020-06-06 (×2): 1 via INTRAVENOUS

## 2020-06-06 MED ORDER — ACETAMINOPHEN 500 MG PO TABS: 1000.0000 mg | ORAL_TABLET | Freq: Once | ORAL | Status: DC | PRN

## 2020-06-06 MED ORDER — DEXTROSE 50 % IV SOLN
INTRAVENOUS | Status: AC
  Administered 2020-06-06: 25 mL via INTRAVENOUS
  Filled 2020-06-06: qty 50

## 2020-06-06 MED ORDER — DEXTROSE 50 % IV SOLN: 25.0000 mL | Freq: Once | INTRAVENOUS | Status: AC

## 2020-06-06 MED ORDER — OXYCODONE HCL 5 MG PO TABS: 5.0000 mg | ORAL_TABLET | Freq: Once | ORAL | Status: DC | PRN

## 2020-06-06 MED ORDER — FENTANYL CITRATE (PF) 250 MCG/5ML IJ SOLN
INTRAMUSCULAR | Status: AC
  Filled 2020-06-06: qty 5

## 2020-06-06 MED ORDER — DEXTROSE 50 % IV SOLN
12.5000 g | INTRAVENOUS | Status: AC
  Administered 2020-06-06: 12.5 g via INTRAVENOUS

## 2020-06-06 MED ORDER — 0.9 % SODIUM CHLORIDE (POUR BTL) OPTIME
TOPICAL | Status: DC | PRN
  Administered 2020-06-06: 1000 mL

## 2020-06-06 MED ORDER — BUPIVACAINE HCL (PF) 0.5 % IJ SOLN
INTRAMUSCULAR | Status: DC | PRN
  Administered 2020-06-06: 20 mL via PERINEURAL

## 2020-06-06 MED ORDER — FENTANYL CITRATE (PF) 100 MCG/2ML IJ SOLN
25.0000 ug | INTRAMUSCULAR | Status: DC | PRN
  Administered 2020-06-06: 50 ug via INTRAVENOUS

## 2020-06-06 MED ORDER — LACTATED RINGERS IV SOLN: INTRAVENOUS | Status: DC | PRN

## 2020-06-06 MED ORDER — FENTANYL CITRATE (PF) 100 MCG/2ML IJ SOLN
INTRAMUSCULAR | Status: AC
  Filled 2020-06-06: qty 2

## 2020-06-06 MED ORDER — PROPOFOL 10 MG/ML IV BOLUS
INTRAVENOUS | Status: DC | PRN
  Administered 2020-06-06: 20 mg via INTRAVENOUS

## 2020-06-06 MED ORDER — DEXTROSE 50 % IV SOLN
25.0000 mL | Freq: Once | INTRAVENOUS | Status: AC
  Administered 2020-06-06: 25 mL via INTRAVENOUS

## 2020-06-06 MED ORDER — MIDAZOLAM HCL 2 MG/2ML IJ SOLN
INTRAMUSCULAR | Status: DC | PRN
  Administered 2020-06-06: 2 mg via INTRAVENOUS

## 2020-06-06 MED ORDER — ALTEPLASE 2 MG IJ SOLR
2.0000 mg | Freq: Once | INTRAMUSCULAR | Status: AC
  Administered 2020-06-06: 2 mg
  Filled 2020-06-06: qty 2

## 2020-06-06 SURGICAL SUPPLY — 39 items
BLADE SAW RECIP 87.9 MT (BLADE) ×3 IMPLANT
BLADE SURG 21 STRL SS (BLADE) ×3 IMPLANT
BNDG COHESIVE 6X5 TAN NS LF (GAUZE/BANDAGES/DRESSINGS) ×1 IMPLANT
BNDG COHESIVE 6X5 TAN STRL LF (GAUZE/BANDAGES/DRESSINGS) ×1 IMPLANT
CANISTER WOUND CARE 500ML ATS (WOUND CARE) ×3 IMPLANT
COVER SURGICAL LIGHT HANDLE (MISCELLANEOUS) ×3 IMPLANT
COVER WAND RF STERILE (DRAPES) IMPLANT
CUFF TOURN SGL QUICK 34 (TOURNIQUET CUFF) ×3
CUFF TRNQT CYL 34X4.125X (TOURNIQUET CUFF) ×2 IMPLANT
DRAPE DERMATAC (DRAPES) ×2 IMPLANT
DRAPE INCISE IOBAN 66X45 STRL (DRAPES) ×3 IMPLANT
DRAPE U-SHAPE 47X51 STRL (DRAPES) ×3 IMPLANT
DRESSING PREVENA PLUS CUSTOM (GAUZE/BANDAGES/DRESSINGS) ×2 IMPLANT
DRSG PREVENA PLUS CUSTOM (GAUZE/BANDAGES/DRESSINGS) ×3
DURAPREP 26ML APPLICATOR (WOUND CARE) ×3 IMPLANT
ELECT REM PT RETURN 9FT ADLT (ELECTROSURGICAL) ×3
ELECTRODE REM PT RTRN 9FT ADLT (ELECTROSURGICAL) ×2 IMPLANT
GLOVE BIOGEL PI IND STRL 9 (GLOVE) ×2 IMPLANT
GLOVE BIOGEL PI INDICATOR 9 (GLOVE) ×1
GLOVE SURG ORTHO 9.0 STRL STRW (GLOVE) ×3 IMPLANT
GOWN STRL REUS W/ TWL XL LVL3 (GOWN DISPOSABLE) ×4 IMPLANT
GOWN STRL REUS W/TWL XL LVL3 (GOWN DISPOSABLE) ×6
KIT BASIN OR (CUSTOM PROCEDURE TRAY) ×3 IMPLANT
KIT TURNOVER KIT B (KITS) ×3 IMPLANT
MANIFOLD NEPTUNE II (INSTRUMENTS) ×3 IMPLANT
NS IRRIG 1000ML POUR BTL (IV SOLUTION) ×3 IMPLANT
PACK ORTHO EXTREMITY (CUSTOM PROCEDURE TRAY) ×3 IMPLANT
PAD ARMBOARD 7.5X6 YLW CONV (MISCELLANEOUS) ×3 IMPLANT
PREVENA RESTOR ARTHOFORM 46X30 (CANNISTER) ×3 IMPLANT
SPONGE LAP 18X18 RF (DISPOSABLE) IMPLANT
STAPLER VISISTAT 35W (STAPLE) ×1 IMPLANT
STOCKINETTE IMPERVIOUS LG (DRAPES) ×3 IMPLANT
SUT ETHILON 2 0 PSLX (SUTURE) ×1 IMPLANT
SUT SILK 2 0 (SUTURE) ×3
SUT SILK 2-0 18XBRD TIE 12 (SUTURE) ×2 IMPLANT
SUT VIC AB 1 CTX 27 (SUTURE) ×5 IMPLANT
TOWEL GREEN STERILE (TOWEL DISPOSABLE) ×3 IMPLANT
TUBE CONNECTING 12X1/4 (SUCTIONS) ×3 IMPLANT
YANKAUER SUCT BULB TIP NO VENT (SUCTIONS) ×3 IMPLANT

## 2020-06-06 NOTE — Op Note (Signed)
   Date of Surgery: 06/06/2020  INDICATIONS: Ms. Reddish is a 30 y.o.-year-old female who has undergone foot salvage intervention with previous ray amputation.  Despite wound care antibiotics and pressure offloading patient has had progressive dehiscence of the wound with exposed bone and osteomyelitis of the midfoot.  Patient does not have foot reconstruction options at this time.Marland Kitchen  PREOPERATIVE DIAGNOSIS: Abscess osteomyelitis right foot  POSTOPERATIVE DIAGNOSIS: Same.  PROCEDURE: Transtibial amputation Application of Prevena wound VAC  SURGEON: [name], M.D.  ANESTHESIA:  general  IV FLUIDS AND URINE: See anesthesia records.  ESTIMATED BLOOD LOSS: See anesthesia records.  COMPLICATIONS: None.  DESCRIPTION OF PROCEDURE: The patient was brought to the operating room after undergoing regional anesthetic. After adequate levels of anesthesia were obtained patient's lower extremity was prepped using DuraPrep draped into a sterile field. A timeout was called. The foot was draped out of the sterile field with impervious stockinette. A transverse incision was made 11 cm distal to the tibial tubercle. This curved proximally and a large posterior flap was created. The tibia was transected 1 cm proximal to the skin incision. The fibula was transected just proximal to the tibial incision. The tibia was beveled anteriorly. A large posterior flap was created. The sciatic nerve was pulled cut and allowed to retract. The vascular bundles were suture ligated with 2-0 silk. The deep and superficial fascial layers were closed using #1 Vicryl. The skin was closed using staples and 2-0 nylon. The wound was covered with a Prevena customizable and arthroform wound VAC.  The dressing was sealed with dermatac there was a good suction fit. A prosthetic shrinker will be applied in patient's room. Patient was taken to the PACU in stable condition.   DISCHARGE PLANNING:  Antibiotic duration: 24 hours  Weightbearing:  Nonweightbearing on the operative extremity  Pain medication: Opioid pathway  Dressing care/ Wound VAC: Continue wound VAC for 1 week after discharge  Discharge to: Discharge planning based on therapy's recommendations for possible inpatient rehabilitation, outpatient rehabilitation, or discharge to home with therapy  Follow-up: In the office 1 week post operative.  Meridee Score, MD [place] 2:34 PM

## 2020-06-06 NOTE — Progress Notes (Addendum)
PROGRESS NOTE        PATIENT DETAILS Name: [name] Age: 30 y.o. Sex: female Date of Birth: 1990-05-01 Admit Date: 06/04/2020 Admitting Physician Jonnie Finner, DO BDZ:[id number], Algis Greenhouse, MD  Brief Narrative: Patient is a 30 y.o. 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  See below for further details  Significant events: 2/12-2/24>> hospitalization for hypoxia due to CHF/aspiration pneumonia, right foot osteo-s/p ray amputation with wound dehiscence-requiring intubation-subsequently discharged on daptomycin/ceftriaxone 3/7>> admit to [facility] for right foot osteomyelitis/diastolic heart failure/aspiration pneumonia  Significant studies: 2/16>> Echo: EF 26-83%, grade 2 diastolic dysfunction 4/1>> chest x-ray: Multifocal pulmonary infiltrates 3/7>> CT right foot with contrast: Suspicious for osteomyelitis at distal cuneiform. 3/7>> CTA chest: No PE, right pleural effusion, extensive bilateral airspace opacities, anasarca  Antimicrobial therapy: Rocephin: 3/7>> Zithromax: 3/7>>  Microbiology data: 2/13>> respiratory virus panel: Negative 2/13>> BAL: Cultures negative 2/13>> BAL FEB/fungal culture: Pending 2/14>> blood cultures: Negative 3/7>> blood cultures: No growth 3/7>> urine culture: No growth  Procedures : 1/14>> right ray amputation by Dr. [name] 2/13>> bronchoscopy 2/13-2/15>> intubation/extubation 2/16-2/19>> reintubated/extubated  Consults: Orthopedics ID  DVT Prophylaxis : enoxaparin (LOVENOX) injection 40 mg Start: 06/04/20 1200   Subjective: No major issues overnight-lying comfortably in bed-just on 1 L of oxygen this morning.   Assessment/Plan: Acute osteomyelitis of right midfoot-history of  right first ray amputation on 1/14 with subsequent wound dehiscence: Unfortunately-has developed osteomyelitis in spite of IV antibiotics and wound care-Dr. [name] following with plans for TMA today.    Acute hypoxic respiratory failure due to HFpEF exacerbation +/-residual pneumonia: Improved-minimal hypoxemia-down to just 1 L of oxygen this morning-remains significantly volume overloaded-continue IV Lasix/IV antibiotics.  Suspect chest x-ray/CT imaging done this admission reflective of prior severe aspiration pneumonia requiring intubation.  Suspect needs repeat imaging in a few weeks-however will discuss case with PCCM at some point.  Anasarca: Due to a combination of HFpEF, diabetic nephropathy/hypoalbuminemia.  Remains volume overloaded-continue IV diuretics-follow volume status/weights.    CKD stage IIIb: Creatinine close to baseline-follow closely.  Normocytic anemia: Due to acute illness/CKD-no evidence of blood loss-follow.  Hyperkalemia: Resolved after Lokelma.  HTN: Stable-continue Coreg  DM-1: CBGs on the higher side yesterday-currently n.p.o. for amputation-hypoglycemic this afternoon-cautiously continue with Lantus/SSI-follow and adjust accordingly.  Recent Labs    06/05/20 2032 06/06/20 0748 06/06/20 1205  GLUCAP 241* 230* 61*   Suspected history of gastroparesis: On Reglan  Depression/anxiety: Stable continue BuSpar/Remeron  GERD: Continue PPI  Anemia: Secondary to CKD-follow and transfuse if hemoglobin <7   Diet: Diet Order            Diet Carb Modified Fluid consistency: Thin; Room service appropriate? Yes  Diet effective now                  Code Status: Full code   Family Communication: Spouse-[name]-[phone]-left voicemail on 3/9  Disposition Plan: Status is: Inpatient  Remains inpatient appropriate because:Inpatient level of care appropriate due to severity of illness   Dispo: The patient is from: Home              Anticipated d/c is to:  Home  Patient currently is not medically stable to d/c.   Difficult to place patient No   Barriers to Discharge: Right foot osteomyelitis-hypoxia due to PNA-on IV antibiotics-transmetatarsal amputation scheduled for 3/9  Antimicrobial agents: Anti-infectives (From admission, onward)   Start     Dose/Rate Route Frequency Ordered Stop   06/06/20 1315  ceFAZolin (ANCEF) IVPB 2g/100 mL premix        2 g 200 mL/hr over 30 Minutes Intravenous On call to O.R. 06/05/20 1221 06/07/20 0559   06/04/20 1130  cefTRIAXone (ROCEPHIN) 2 g in sodium chloride 0.9 % 100 mL IVPB        2 g 200 mL/hr over 30 Minutes Intravenous Every 24 hours 06/04/20 1122     06/04/20 1130  azithromycin (ZITHROMAX) tablet 500 mg        500 mg Oral Daily 06/04/20 1122         Time spent: 25 minutes-Greater than 50% of this time was spent in counseling, explanation of diagnosis, planning of further management, and coordination of care.  MEDICATIONS: Scheduled Meds: . dextrose      . azithromycin  500 mg Oral Daily  . busPIRone  5 mg Oral TID  . carvedilol  12.5 mg Oral BID WC  . Chlorhexidine Gluconate Cloth  6 each Topical Daily  . dextrose  25 mL Intravenous Once  . enoxaparin (LOVENOX) injection  40 mg Subcutaneous Daily  . furosemide  40 mg Intravenous BID  . insulin aspart  0-6 Units Subcutaneous TID WC  . insulin glargine  10 Units Subcutaneous Daily  . metoCLOPramide  5 mg Oral BID  . mirtazapine  30 mg Oral QHS  . pantoprazole  40 mg Oral BID  . sodium zirconium cyclosilicate  5 g Oral TID   Continuous Infusions: .  ceFAZolin (ANCEF) IV    . cefTRIAXone (ROCEPHIN)  IV 2 g (06/05/20 1452)   PRN Meds:.acetaminophen **OR** acetaminophen, cyclobenzaprine, labetalol, LORazepam, morphine injection, oxyCODONE, sodium chloride flush   PHYSICAL EXAM: Vital signs: Vitals:   06/06/20 0746 06/06/20 0908 06/06/20 1000 06/06/20 1204  BP: (!) 188/96   (!) 171/100  Pulse: 88 91 94 84  Resp: 20 20  (!) 21 14  Temp: 97.8 F (36.6 C)   97.8 F (36.6 C)  TempSrc: Oral   Oral  SpO2: 97% 99% 93% 94%  Weight:      Height:       Filed Weights   06/04/20 1930 06/05/20 0500  Weight: 85 kg 85 kg   Body mass index is 29.35 kg/m.   Gen Exam:Alert awake-not in any distress HEENT:atraumatic, normocephalic Chest: B/L clear to auscultation anteriorly CVS:S1S2 regular Abdomen:soft non tender, non distended Extremities:++ edema Neurology: Non focal Skin: no rash  I have personally reviewed following labs and imaging studies  LABORATORY DATA: CBC: Recent Labs  Lab 06/04/20 0420 06/05/20 0350 06/05/20 2302 06/06/20 0258  WBC 8.9 8.1  --  6.5  NEUTROABS 6.2  --   --   --   HGB 7.9* 7.7* 8.5* 8.5*  HCT 26.6* 24.7* 27.0* 26.4*  MCV 96.7 93.6  --  90.1  PLT 236 220  --  191    Basic Metabolic Panel: Recent Labs  Lab 06/04/20 0522 06/04/20 0747 06/04/20 2205 06/05/20 0350 06/06/20 0258  NA 136  --  131* 133* 130*  K 7.0* 6.3* 5.8* 5.5* 4.5  CL 110  --  103 103 102  CO2 22  --  20* 22 21*  GLUCOSE 77  --  285* 278* 350*  BUN 33*  --  27* 27* 29*  CREATININE 1.30*  --  1.40* 1.44* 1.48*  CALCIUM 8.3*  --  8.3* 8.5* 7.8*  PHOS  --   --  4.4  --   --     GFR: Estimated Creatinine Clearance: 62.3 mL/min (A) (by C-G formula based on SCr of 1.48 mg/dL (H)).  Liver Function Tests: Recent Labs  Lab 06/04/20 0522 06/04/20 2205 06/05/20 0350 06/06/20 0258  AST 34  --  27 24  ALT 58*  --  47* 38  ALKPHOS 253*  --  234* 215*  BILITOT 0.6  --  0.5 0.4  PROT 6.8  --  6.2* 6.1*  ALBUMIN 2.0* 1.7* 1.6* 1.6*   No results for input(s): LIPASE, AMYLASE in the last 168 hours. No results for input(s): AMMONIA in the last 168 hours.  Coagulation Profile: Recent Labs  Lab 06/04/20 0420  INR 1.2    Cardiac Enzymes: No results for input(s): CKTOTAL, CKMB, CKMBINDEX, TROPONINI in the last 168 hours.  BNP (last 3 results) No results for input(s): PROBNP in the last 8760  hours.  Lipid Profile: No results for input(s): CHOL, HDL, LDLCALC, TRIG, CHOLHDL, LDLDIRECT in the last 72 hours.  Thyroid Function Tests: No results for input(s): TSH, T4TOTAL, FREET4, T3FREE, THYROIDAB in the last 72 hours.  Anemia Panel: No results for input(s): VITAMINB12, FOLATE, FERRITIN, TIBC, IRON, RETICCTPCT in the last 72 hours.  Urine analysis:    Component Value Date/Time   COLORURINE YELLOW 06/04/2020 0813   APPEARANCEUR CLEAR 06/04/2020 0813   LABSPEC 1.012 06/04/2020 0813   PHURINE 5.0 06/04/2020 0813   GLUCOSEU NEGATIVE 06/04/2020 0813   HGBUR SMALL (A) 06/04/2020 0813   BILIRUBINUR NEGATIVE 06/04/2020 0813   BILIRUBINUR negative 01/02/2015 1717   KETONESUR NEGATIVE 06/04/2020 0813   PROTEINUR 100 (A) 06/04/2020 0813   UROBILINOGEN 0.2 01/13/2015 0223   NITRITE NEGATIVE 06/04/2020 0813   LEUKOCYTESUR NEGATIVE 06/04/2020 0813    Sepsis Labs: Lactic Acid, Venous    Component Value Date/Time   LATICACIDVEN 0.6 06/04/2020 0420    MICROBIOLOGY: Recent Results (from the past 240 hour(s))  Blood culture (routine x 2)     Status: None (Preliminary result)   Collection Time: 06/04/20  3:47 AM   Specimen: BLOOD  Result Value Ref Range Status   Specimen Description   Final    BLOOD BLOOD LEFT FOREARM Performed at [facility], [place] [address], [place], [place] [address]    Special Requests   Final    BOTTLES DRAWN AEROBIC AND ANAEROBIC Blood Culture adequate volume Performed at [facility] [address] [place], [place] [address]    Culture   Final    NO GROWTH 2 DAYS Performed at [facility] [address] [place], [place] [address]    Report Status PENDING  Incomplete  Blood culture (routine x 2)     Status: None (Preliminary result)   Collection Time: 06/04/20  5:07 AM   Specimen: BLOOD  Result Value Ref Range Status   Specimen Description   Final    BLOOD BLOOD RIGHT HAND Performed at [facility] [address], [place], [place] [address]    Special Requests   Final    BOTTLES DRAWN AEROBIC AND ANAEROBIC Blood Culture adequate volume Performed at [facility] [address], [place], [place] [address]    Culture   Final  NO GROWTH 2 DAYS Performed at [facility], [place] [address], [place], [place] [address]    Report Status PENDING  Incomplete  Resp Panel by RT-PCR (Flu A&B, Covid) Nasopharyngeal Swab     Status: None   Collection Time: 06/04/20  7:18 AM   Specimen: Nasopharyngeal Swab; Nasopharyngeal(NP) swabs in vial transport medium  Result Value Ref Range Status   SARS Coronavirus 2 by RT PCR NEGATIVE NEGATIVE Final    Comment: (NOTE) SARS-CoV-2 target nucleic acids are NOT DETECTED.  The SARS-CoV-2 RNA is generally detectable in upper respiratory specimens during the acute phase of infection. The lowest concentration of SARS-CoV-2 viral copies this assay can detect is 138 copies/mL. A negative result does not preclude SARS-Cov-2 infection and should not be used as the sole basis for treatment or other patient management decisions. A negative result may occur with  improper specimen collection/handling, submission of specimen other than nasopharyngeal swab, presence of viral mutation(s) within the areas targeted by this assay, and inadequate number of viral copies(<138 copies/mL). A negative result must be combined with clinical observations, patient history, and epidemiological information. The expected result is Negative.  Fact Sheet for Patients:  EntrepreneurPulse.com.au  Fact Sheet for Healthcare Providers:  IncredibleEmployment.be  This test is no t yet approved or cleared by the [place] FDA and  has been authorized for detection and/or diagnosis of SARS-CoV-2 by FDA under an Emergency Use Authorization (EUA). This EUA will remain  in effect (meaning this test can be  used) for the duration of the COVID-19 declaration under Section 564(b)(1) of the Act, 21 U.S.C.section 360bbb-3(b)(1), unless the authorization is terminated  or revoked sooner.       Influenza A by PCR NEGATIVE NEGATIVE Final   Influenza B by PCR NEGATIVE NEGATIVE Final    Comment: (NOTE) The Xpert Xpress SARS-CoV-2/FLU/RSV plus assay is intended as an aid in the diagnosis of influenza from Nasopharyngeal swab specimens and should not be used as a sole basis for treatment. Nasal washings and aspirates are unacceptable for Xpert Xpress SARS-CoV-2/FLU/RSV testing.  Fact Sheet for Patients: EntrepreneurPulse.com.au  Fact Sheet for Healthcare Providers: IncredibleEmployment.be  This test is not yet approved or cleared by the [place] FDA and has been authorized for detection and/or diagnosis of SARS-CoV-2 by FDA under an Emergency Use Authorization (EUA). This EUA will remain in effect (meaning this test can be used) for the duration of the COVID-19 declaration under Section 564(b)(1) of the Act, 21 U.S.C. section 360bbb-3(b)(1), unless the authorization is terminated or revoked.  Performed at [facility], [place] [address], [place], [place] [address]   Urine culture     Status: None   Collection Time: 06/04/20  8:13 AM   Specimen: In/Out Cath Urine  Result Value Ref Range Status   Specimen Description   Final    IN/OUT CATH URINE Performed at [facility] [address] [place], [place] [address]    Special Requests   Final    NONE Performed at [facility], [place] [address] [place], [place] [address]    Culture   Final    NO GROWTH Performed at [facility], [place] [address], [place], [place] [address]    Report Status 06/05/2020 FINAL  Final  MRSA PCR Screening     Status: None   Collection Time: 06/05/20  3:59 PM   Specimen: Nasal Mucosa; Nasopharyngeal  Result  Value Ref Range Status   MRSA by  PCR NEGATIVE NEGATIVE Final    Comment:        The GeneXpert MRSA Assay (FDA approved for NASAL specimens only), is one component of a comprehensive MRSA colonization surveillance program. It is not intended to diagnose MRSA infection nor to guide or monitor treatment for MRSA infections. Performed at [facility], [place] [address] [place], [place] [address]     RADIOLOGY STUDIES/RESULTS: No results found.   LOS: 2 days   Oren Binet, MD  [facility]    To contact the attending provider between 7A-7P or the covering provider during after hours 7P-7A, please log into the web site [email or web address] and access using universal [place] password for that web site. If you do not have the password, please call the hospital operator.  06/06/2020, 12:21 PM

## 2020-06-06 NOTE — Progress Notes (Signed)
Orthopedic Tech Progress Note Patient Details:  [name] [birth year] [id number] Called [employer] for [facility] [employer].  Patient ID: [name], female   DOB: [birth year], 30 y.o.   MRN: [id number]   Petra Kuba 06/06/2020, 5:32 PM

## 2020-06-06 NOTE — Anesthesia Procedure Notes (Signed)
Anesthesia Regional Block: Popliteal block   Pre-Anesthetic Checklist: ,, timeout performed, Correct Patient, Correct Site, Correct Laterality, Correct Procedure, Correct Position, site marked, Risks and benefits discussed,  Surgical consent,  Pre-op evaluation,  At surgeon's request and post-op pain management  Laterality: Right and Lower  Prep: chloraprep       Needles:  Injection technique: Single-shot     Needle Length: 9cm  Needle Gauge: 22     Additional Needles: Arrow StimuQuik ECHO Echogenic Stimulating PNB Needle  Procedures:,,,, ultrasound used (permanent image in chart),,,,  Narrative:  Start time: 06/06/2020 1:12 PM End time: 06/06/2020 1:21 PM Injection made incrementally with aspirations every 5 mL.  Performed by: Personally  Anesthesiologist: Oleta Mouse, MD

## 2020-06-06 NOTE — Progress Notes (Signed)
   06/06/20 1204  Vitals  Temp Source Oral  BP (!) 171/100  MAP (mmHg) 120  BP Location Left Arm  BP Method Automatic  Patient Position (if appropriate) Sitting  Pulse Rate 84   Pre-procedural area notified.

## 2020-06-06 NOTE — Progress Notes (Signed)
   06/06/20 1541  Vitals  Temp (!) 97.2 F (36.2 C)  Temp Source Oral  BP (!) 168/94  MAP (mmHg) 117  BP Location Left Arm  BP Method Automatic  Patient Position (if appropriate) Lying  Pulse Rate 75  Pulse Rate Source Monitor  Resp 15  MEWS COLOR  MEWS Score Color Green  Oxygen Therapy  SpO2 92 %  O2 Device Nasal Cannula  O2 Flow Rate (L/min) 2 L/min   Returned from OR. Updated on new plan of care. Pain meds administered for severe phanthom pain.

## 2020-06-06 NOTE — Anesthesia Procedure Notes (Signed)
Anesthesia Regional Block: Femoral nerve block   Pre-Anesthetic Checklist: ,, timeout performed, Correct Patient, Correct Site, Correct Laterality, Correct Procedure, Correct Position, site marked, Risks and benefits discussed,  Surgical consent,  Pre-op evaluation,  At surgeon's request and post-op pain management  Laterality: Right  Prep: chloraprep       Needles:  Injection technique: Single-shot     Needle Length: 9cm  Needle Gauge: 22     Additional Needles: Arrow StimuQuik ECHO Echogenic Stimulating PNB Needle  Procedures:,,,, ultrasound used (permanent image in chart),,,,  Narrative:  Start time: 06/06/2020 1:01 PM End time: 06/06/2020 1:11 PM Injection made incrementally with aspirations every 5 mL.  Performed by: Personally  Anesthesiologist: Oleta Mouse, MD

## 2020-06-06 NOTE — Progress Notes (Addendum)
Hypoglycemic Event  CBG: 63  Treatment: 1 tube glucose gel  Symptoms:None  Follow-up CBG: Time:2202 CBG Result:109  Possible Reasons for Event: Vomiting and Inadequate meal intake  Comments/MD notified: MD notified    Neta Mends

## 2020-06-06 NOTE — Progress Notes (Incomplete)
Initial Nutrition Assessment  DOCUMENTATION CODES:      INTERVENTION:  ***   NUTRITION DIAGNOSIS:     related to   as evidenced by  .  GOAL:      MONITOR:      REASON FOR ASSESSMENT:   Consult Wound healing  ASSESSMENT:       Meal Completion: 100% of all meals documented   Medications reviewed and include: lasix, SSI, lantus, reglan, remeron, lokelma  Labs reviewed: Na 130, Albumin 1.6 due to acute inflammatory process CBG's: 142-241    NUTRITION - FOCUSED PHYSICAL EXAM:  {RD Focused Exam List:21252}  Diet Order:   Diet Order            Diet Carb Modified Fluid consistency: Thin; Room service appropriate? Yes  Diet effective now                 EDUCATION NEEDS:      Skin:     Last BM:     Height:   Ht Readings from Last 1 Encounters:  06/04/20 5\' 7"  (1.702 m)    Weight:   Wt Readings from Last 1 Encounters:  06/05/20 85 kg    Ideal Body Weight:     BMI:  Body mass index is 29.35 kg/m.  Estimated Nutritional Needs:   Kcal:     Protein:     Fluid:     Heather P., RD, LDN, CNSC See AMiON for contact information

## 2020-06-06 NOTE — Anesthesia Preprocedure Evaluation (Addendum)
Anesthesia Evaluation  Patient identified by MRN, date of birth, ID band Patient awake    Reviewed: Allergy & Precautions, NPO status , Patient's Chart, lab work & pertinent test results  History of Anesthesia Complications Negative for: history of anesthetic complications  Airway Mallampati: II  TM Distance: >3 FB Neck ROM: Full    Dental  (+) Edentulous Upper, Edentulous Lower   Pulmonary neg shortness of breath, neg sleep apnea, neg COPD, neg recent URI,  Covid-19 Nucleic Acid Test Results Lab Results      Component                Value               Date                      SARSCOV2NAA              NEGATIVE            06/04/2020                SARSCOV2NAA              NEGATIVE            05/12/2020                SARSCOV2NAA              POSITIVE (A)        04/05/2020                [place]              NEGATIVE            03/21/2020                SARSCOV2NAA              POSITIVE (A)        09/24/2019              breath sounds clear to auscultation       Cardiovascular hypertension, Pt. on medications (-) angina(-) Past MI and (-) CHF  Rhythm:Regular  1. Left ventricular ejection fraction, by estimation, is 55 to 60%. The  left ventricle has normal function. The left ventricle has no regional  wall motion abnormalities. There is mild left ventricular hypertrophy.  Left ventricular diastolic parameters  are consistent with Grade II diastolic dysfunction (pseudonormalization).  Elevated left atrial pressure.  2. Right ventricular systolic function is normal. The right ventricular  size is normal. There is mildly elevated pulmonary artery systolic  pressure.  3. The mitral valve is normal in structure. No evidence of mitral valve  regurgitation.  4. The aortic valve is tricuspid. Aortic valve regurgitation is not  visualized. No aortic stenosis is present.    Neuro/Psych PSYCHIATRIC DISORDERS Anxiety Depression   Neuromuscular disease    GI/Hepatic Neg liver ROS, GERD  Medicated,  Endo/Other  diabetes, Insulin DependentLab Results      Component                Value               Date                      HGBA1C                   6.9 (H)  06/04/2020             Renal/GU CRFRenal diseaseLab Results      Component                Value               Date                      CREATININE               1.48 (H)            06/06/2020           Lab Results      Component                Value               Date                      K                        4.5                 06/06/2020                Musculoskeletal  (+) Fibromyalgia -  Abdominal   Peds  Hematology  (+) Blood dyscrasia, anemia , Lab Results      Component                Value               Date                      WBC                      6.5                 06/06/2020                HGB                      8.5 (L)             06/06/2020                HCT                      26.4 (L)            06/06/2020                MCV                      90.1                06/06/2020                PLT                      202                 06/06/2020              Anesthesia Other Findings   Reproductive/Obstetrics  Anesthesia Physical Anesthesia Plan  ASA: III  Anesthesia Plan: MAC and Regional   Post-op Pain Management:    Induction: Intravenous  PONV Risk Score and Plan: 2 and Propofol infusion and Treatment may vary due to age or medical condition  Airway Management Planned: Nasal Cannula  Additional Equipment: None  Intra-op Plan:   Post-operative Plan:   Informed Consent: I have reviewed the patients History and Physical, chart, labs and discussed the procedure including the risks, benefits and alternatives for the proposed anesthesia with the patient or authorized representative who has indicated his/her understanding and acceptance.      Dental advisory given  Plan Discussed with: CRNA and Surgeon  Anesthesia Plan Comments:         Anesthesia Quick Evaluation

## 2020-06-06 NOTE — Interval H&P Note (Signed)
History and Physical Interval Note:  1/0/0349 6:11 AM  [name]  has presented today for surgery, with the diagnosis of right foot osteomyelitis.  The various methods of treatment have been discussed with the patient and family. After consideration of risks, benefits and other options for treatment, the patient has consented to  Procedure(s): right below knee amputation (Right) as a surgical intervention.  The patient's history has been reviewed, patient examined, no change in status, stable for surgery.  I have reviewed the patient's chart and labs.  Questions were answered to the patient's satisfaction.     Newt Minion

## 2020-06-06 NOTE — Progress Notes (Signed)
Hypoglycemic Event  CBG: 63  Treatment: 4 oz juice/soda  Symptoms: None  Follow-up CBG: Time:2128CBG  Result:63   Possible Reasons for Event: Vomiting  Comments/MD notified: MD notified    Neta Mends

## 2020-06-06 NOTE — Progress Notes (Signed)
Hypoglycemic Event  CBG: 61  Treatment:25 mls of D50.  Symptoms: Asymptomatic  [facility] OR at ext [phone] notified ti,e of Follow-up CBG: Time:1235  Possible Reasons for Event: NPO  Latika Kronick Pincus Sanes, RN

## 2020-06-06 NOTE — Telephone Encounter (Signed)
Zacarias Pontes called stating the pt is having surgery today and wanted to confirm wether she was supposed to be given lovinax? She also wanted to make Dr.Duda aware the pts blood sugar was 230 and her hemoglobin levels were at a 8.5. She would like a CB ASAP to discuss the medicine.   [phone]

## 2020-06-06 NOTE — Transfer of Care (Signed)
Immediate Anesthesia Transfer of Care Note  Patient: [name]  Procedure(s) Performed: RIGHT BELOW KNEE AMPUTATION (Right Knee) APPLICATION OF WOUND VAC (Right Leg Lower)  Patient Location: PACU  Anesthesia Type:MAC combined with regional for post-op pain  Level of Consciousness: awake, alert  and oriented  Airway & Oxygen Therapy: Patient Spontanous Breathing  Post-op Assessment: Report given to RN and Post -op Vital signs reviewed and stable  Post vital signs: Reviewed and stable  Last Vitals:  Vitals Value Taken Time  BP 123/82 06/06/20 1425  Temp    Pulse 73 06/06/20 1432  Resp 10 06/06/20 1432  SpO2 100 % 06/06/20 1432  Vitals shown include unvalidated device data.  Last Pain:  Vitals:   06/06/20 1204  TempSrc: Oral  PainSc:       Patients Stated Pain Goal: 5 (02/77/41 2878)  Complications: No complications documented.

## 2020-06-06 NOTE — Plan of Care (Signed)

## 2020-06-06 NOTE — Progress Notes (Addendum)
Inpatient Diabetes Program Recommendations  AACE/ADA: New Consensus Statement on Inpatient Glycemic Control (2015)  Target Ranges:  Prepandial:   less than 140 mg/dL      Peak postprandial:   less than 180 mg/dL (1-2 hours)      Critically ill patients:  140 - 180 mg/dL   Lab Results  Component Value Date   GLUCAP 230 (H) 06/06/2020   HGBA1C 6.9 (H) 06/04/2020    Review of Glycemic Control Results for LEMA, HEINKEL (MRN [id number]) as of 06/06/2020 09:42  Ref. Range 06/05/2020 12:02 06/05/2020 16:50 06/05/2020 20:32 06/06/2020 07:48  Glucose-Capillary Latest Ref Range: 70 - 99 mg/dL 142 (H) 142 (H) 241 (H) 230 (H)   Diabetes history: DM1 Outpatient Diabetes medications: Lantus 10 units bid + Humalog 0-5 units tid Current orders for Inpatient glycemic control:  Novolog 0-6 units Lantus 10 units Daily  Inpatient Diabetes Program Recommendations:   OR today Pt ordered Lantus Daily Consider: Increasing Lantus to 7 units bid (10 bid if given decadron during surgery)  Thank you, Tama Headings RN, MSN, BC-ADM Inpatient Diabetes Coordinator Team Pager [phone] (8a-5p)

## 2020-06-06 NOTE — Progress Notes (Signed)
CBG: 66  Treatment:25 mls of D50.  Symptoms: Asymptomatic   CBG: Time:1842, CBG: 88  Possible Reasons for Event: NPO and N/V  Marylu Lund, RN

## 2020-06-06 NOTE — Anesthesia Postprocedure Evaluation (Signed)
Anesthesia Post Note  Patient: [name]  Procedure(s) Performed: RIGHT BELOW KNEE AMPUTATION (Right Knee) APPLICATION OF WOUND VAC (Right Leg Lower)     Patient location during evaluation: PACU Anesthesia Type: Regional and MAC Level of consciousness: awake and alert Pain management: pain level controlled Vital Signs Assessment: post-procedure vital signs reviewed and stable Respiratory status: spontaneous breathing, nonlabored ventilation and respiratory function stable Cardiovascular status: stable and blood pressure returned to baseline Postop Assessment: no apparent nausea or vomiting Anesthetic complications: no   No complications documented.  Last Vitals:  Vitals:   06/06/20 1455 06/06/20 1510  BP: (!) 138/99 (!) 143/87  Pulse: 70 70  Resp: (!) 9 (!) 9  Temp:    SpO2: 100% 100%    Last Pain:  Vitals:   06/06/20 1510  TempSrc:   PainSc: Asleep                 Sandra Brents,W. EDMOND

## 2020-06-06 NOTE — Progress Notes (Signed)
Hypoglycemic Event  CBG:62  Treatment: 1 ampule of D50 given at 1430  Symptoms: Clammy skin  Follow-up CBG: Time:1450 CBG Result:126  Possible Reasons for Event: Pt. Type one diabetic and took lantus this am and has been NPO for procedure today.  Comments/MD notified:Dr. Sampson Goon, anesthesiologist    Jason Nest

## 2020-06-07 ENCOUNTER — Ambulatory Visit: Payer: 59 | Admitting: Orthopedic Surgery

## 2020-06-07 ENCOUNTER — Encounter (HOSPITAL_COMMUNITY): Payer: Self-pay | Admitting: Orthopedic Surgery

## 2020-06-07 LAB — GLUCOSE, CAPILLARY: Glucose-Capillary: 152 mg/dL — ABNORMAL HIGH (ref 70–99)

## 2020-06-07 LAB — COMPREHENSIVE METABOLIC PANEL
ALT: 29 U/L (ref 0–44)
AST: 21 U/L (ref 15–41)
Albumin: 1.5 g/dL — ABNORMAL LOW (ref 3.5–5.0)
Alkaline Phosphatase: 181 U/L — ABNORMAL HIGH (ref 38–126)
Anion gap: 7 (ref 5–15)
BUN: 24 mg/dL — ABNORMAL HIGH (ref 6–20)
CO2: 25 mmol/L (ref 22–32)
Calcium: 8 mg/dL — ABNORMAL LOW (ref 8.9–10.3)
Chloride: 103 mmol/L (ref 98–111)
Creatinine, Ser: 1.37 mg/dL — ABNORMAL HIGH (ref 0.44–1.00)
GFR, Estimated: 53 mL/min — ABNORMAL LOW (ref >60)
Glucose, Bld: 218 mg/dL — ABNORMAL HIGH (ref 70–99)
Potassium: 4.5 mmol/L (ref 3.5–5.1)
Sodium: 135 mmol/L (ref 135–145)
Total Bilirubin: 0.5 mg/dL (ref 0.3–1.2)
Total Protein: 5.7 g/dL — ABNORMAL LOW (ref 6.5–8.1)

## 2020-06-07 LAB — CBC
HCT: 23.1 % — ABNORMAL LOW (ref 36.0–46.0)
Hemoglobin: 7.5 g/dL — ABNORMAL LOW (ref 12.0–15.0)
MCH: 29.1 pg (ref 26.0–34.0)
MCHC: 32.5 g/dL (ref 30.0–36.0)
MCV: 89.5 fL (ref 80.0–100.0)
Platelets: 232 10*3/uL (ref 150–400)
RBC: 2.58 MIL/uL — ABNORMAL LOW (ref 3.87–5.11)
RDW: 15.3 % (ref 11.5–15.5)
WBC: 7.8 10*3/uL (ref 4.0–10.5)
nRBC: 0 % (ref 0.0–0.2)

## 2020-06-07 MED ORDER — FUROSEMIDE 10 MG/ML IJ SOLN
60.0000 mg | Freq: Two times a day (BID) | INTRAMUSCULAR | Status: DC
  Administered 2020-06-07 (×2): 60 mg via INTRAVENOUS
  Filled 2020-06-07 (×2): qty 6

## 2020-06-07 MED ORDER — OXYCODONE HCL 5 MG PO TABS
10.0000 mg | ORAL_TABLET | Freq: Four times a day (QID) | ORAL | Status: DC | PRN
  Administered 2020-06-08 – 2020-06-14 (×7): 10 mg via ORAL
  Filled 2020-06-07 (×11): qty 2

## 2020-06-07 MED ORDER — ADULT MULTIVITAMIN W/MINERALS CH
1.0000 | ORAL_TABLET | Freq: Every day | ORAL | Status: DC
  Administered 2020-06-07 – 2020-06-09 (×2): 1 via ORAL
  Filled 2020-06-07 (×6): qty 1

## 2020-06-07 MED ORDER — PROMETHAZINE HCL 25 MG/ML IJ SOLN
25.0000 mg | Freq: Four times a day (QID) | INTRAMUSCULAR | Status: DC | PRN
  Administered 2020-06-07 – 2020-06-11 (×13): 25 mg via INTRAVENOUS
  Filled 2020-06-07 (×13): qty 1

## 2020-06-07 MED ORDER — ORAL CARE MOUTH RINSE
15.0000 mL | Freq: Two times a day (BID) | OROMUCOSAL | Status: DC
  Administered 2020-06-07 – 2020-06-13 (×11): 15 mL via OROMUCOSAL

## 2020-06-07 MED ORDER — ACETAMINOPHEN 500 MG PO TABS
1000.0000 mg | ORAL_TABLET | Freq: Three times a day (TID) | ORAL | Status: DC
  Administered 2020-06-07 – 2020-06-10 (×8): 1000 mg via ORAL
  Filled 2020-06-07 (×9): qty 2

## 2020-06-07 MED ORDER — POLYETHYLENE GLYCOL 3350 17 G PO PACK
17.0000 g | PACK | Freq: Every day | ORAL | Status: DC
  Filled 2020-06-07 (×3): qty 1

## 2020-06-07 MED ORDER — BOOST / RESOURCE BREEZE PO LIQD CUSTOM
1.0000 | Freq: Three times a day (TID) | ORAL | Status: DC
  Administered 2020-06-07 – 2020-06-10 (×10): 1 via ORAL
  Filled 2020-06-07 (×4): qty 1

## 2020-06-07 MED ORDER — HYDROMORPHONE HCL 1 MG/ML IJ SOLN
1.0000 mg | INTRAMUSCULAR | Status: DC | PRN
  Administered 2020-06-07 – 2020-06-10 (×17): 1 mg via INTRAVENOUS
  Filled 2020-06-07 (×17): qty 1

## 2020-06-07 MED ORDER — INSULIN GLARGINE 100 UNIT/ML ~~LOC~~ SOLN
5.0000 [IU] | Freq: Every day | SUBCUTANEOUS | Status: DC
  Administered 2020-06-07 – 2020-06-10 (×4): 5 [IU] via SUBCUTANEOUS
  Filled 2020-06-07 (×5): qty 0.05

## 2020-06-07 MED ORDER — MORPHINE SULFATE (PF) 4 MG/ML IV SOLN
4.0000 mg | Freq: Once | INTRAVENOUS | Status: AC
  Administered 2020-06-07: 4 mg via INTRAVENOUS
  Filled 2020-06-07: qty 1

## 2020-06-07 MED ORDER — SENNOSIDES-DOCUSATE SODIUM 8.6-50 MG PO TABS
2.0000 | ORAL_TABLET | Freq: Every day | ORAL | Status: DC
  Filled 2020-06-07: qty 2

## 2020-06-07 MED ORDER — PROSOURCE PLUS PO LIQD
30.0000 mL | Freq: Three times a day (TID) | ORAL | Status: DC
  Administered 2020-06-07 – 2020-06-14 (×10): 30 mL via ORAL
  Filled 2020-06-07 (×13): qty 30

## 2020-06-07 NOTE — Evaluation (Signed)
Physical Therapy Evaluation Patient Details Name: [name] MRN: [id number] DOB: Apr 18, 1990 Today's Date: 06/07/2020   History of Present Illness  [name] is a 30 y.o. female who presents with nonhealing right foot first ray amputation.  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  Patient has undergone wound care and has improved granulation tissue around the chronic draining sinus tract but still has a deep infection. 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 PTA pt husband was helping with pt transfers to w/c and bathing and dressing. Pt is currently limited in safe mobility by nausea and emotionally labile state, in presence of altered CoG, and decreased strength. Pt is self limited however is supervision for coming to EoB where she was able to self steady for 8 minutes and then perform lateral scoot transfer across R affected limb to drop arm recliner on her R with min guard. Pt educated on phantom pains and neuro reeducation.  PT recommending HHPT to improve her independence in her home environment. PT will continue to follow acutely.      Follow Up Recommendations Home health PT;Supervision for mobility/OOB    Equipment Recommendations  None recommended by PT (has necessary equipment)       Precautions / Restrictions Precautions Precautions: Fall Precaution Comments: wound vac Restrictions Weight Bearing Restrictions: Yes RLE Weight Bearing: Non weight bearing      Mobility  Bed Mobility Overal bed mobility: Needs Assistance Bed Mobility: Supine to Sit;Sit to Supine     Supine to sit: Supervision Sit to supine: Supervision   General bed mobility comments: Supervision for safety though no  physical assist needed    Transfers Overall transfer level: Needs assistance Equipment used: 1 person hand held assist Transfers: Lateral/Scoot Transfers          Lateral/Scoot Transfers: Min guard General transfer comment: min guard for scoot transfer to drop arm recliner. Deferred RW trial though likely to need +2 person assist for safety     Balance Overall balance assessment: Needs assistance Sitting-balance support: Feet supported Sitting balance-Leahy Scale: Good Sitting balance - Comments: EOB without UE support                                     Pertinent Vitals/Pain Pain Assessment: No/denies pain Pain Descriptors / Indicators: Crying Pain Intervention(s): Limited activity within patient's tolerance;Monitored during session;Repositioned    Home Living Family/patient expects to be discharged to:: Private residence Living Arrangements: Spouse/significant other;Other relatives Available Help at Discharge: Family;Available 24 hours/day;Available PRN/intermittently;Friend(s) Type of Home: House Home Access: Stairs to enter Entrance Stairs-Rails: Can reach both Entrance Stairs-Number of Steps: 4 Home Layout: One level Home Equipment: Walker - 2 wheels;Wheelchair - [employer];Shower seat      Prior Function Level of Independence: Needs assistance   Gait / Transfers Assistance Needed: hopping limited transfers, husband assist with transfers and getting out of bed,  use of WC in house (does not fit in bathroom)  ADL's / Homemaking Assistance Needed: husband assists with bathing and LB dressing. pt reports wc/walker unable to fit in bathroom, uses BSC and sponge bathes with assist  Comments: 22 yo daughter  at home, family assist with child care     Hand Dominance   Dominant Hand: Right    Extremity/Trunk Assessment   Upper Extremity Assessment Upper Extremity Assessment: Defer to OT evaluation    Lower Extremity Assessment Lower  Extremity Assessment: RLE deficits/detail;LLE deficits/detail RLE Deficits / Details: new BKA LLE Deficits / Details: ROM WFL, strength grossly 4/5 LLE Coordination: WNL    Cervical / Trunk Assessment Cervical / Trunk Assessment: Normal  Communication   Communication: No difficulties (very tearful)  Cognition Arousal/Alertness: Awake/alert Behavior During Therapy: Flat affect (very tearful) Overall Cognitive Status: Within Functional Limits for tasks assessed                                 General Comments: Pt tearful throughout session with encouragement provided. Self limiting at times      General Comments General comments (skin integrity, edema, etc.): on 3 L O2 on entry, titrated to 1 L O2 with O2 WFL. Trialed on RA with SpO2 functional during tasks but at rest in chair, down to 86% on RA - RN notified. Educated on positioning and movement of residual limb    Exercises General Exercises - Lower Extremity Long Arc Quad: Left;5 reps;Seated Hip Flexion/Marching: Left;5 reps;Seated   Assessment/Plan    PT Assessment Patient needs continued PT services  PT Problem List         PT Treatment Interventions DME instruction;Gait training;Stair training;Functional mobility training;Therapeutic activities;Therapeutic exercise;Balance training;Cognitive remediation;Patient/family education    PT Goals (Current goals can be found in the Care Plan section)  Acute Rehab PT Goals Patient Stated Goal: go home, decrease nausea PT Goal Formulation: With patient/family Time For Goal Achievement: 06/01/20 Potential to Achieve Goals: Good    Frequency Min 3X/week           Co-evaluation PT/OT/SLP Co-Evaluation/Treatment: Yes Reason for Co-Treatment: For patient/therapist safety PT goals addressed during session: Mobility/safety with mobility OT goals addressed during session: ADL's and self-care       AM-PAC PT "6 Clicks" Mobility  Outcome Measure Help needed  turning from your back to your side while in a flat bed without using bedrails?: None Help needed moving from lying on your back to sitting on the side of a flat bed without using bedrails?: None Help needed moving to and from a bed to a chair (including a wheelchair)?: A Little Help needed standing up from a chair using your arms (e.g., wheelchair or bedside chair)?: A Lot Help needed to walk in hospital room?: A Lot Help needed climbing 3-5 steps with a railing? : Total 6 Click Score: 16    End of Session Equipment Utilized During Treatment: Gait belt Activity Tolerance: Patient tolerated treatment well Patient left: in chair;with call bell/phone within reach;with chair alarm set Nurse Communication: Mobility status;Precautions PT Visit Diagnosis: Other abnormalities of gait and mobility (R26.89);Muscle weakness (generalized) (M62.81);Difficulty in walking, not elsewhere classified (R26.2)    Time: 8110-3159 PT Time Calculation (min) (ACUTE ONLY): 39 min   Charges:   PT Evaluation $PT Eval Moderate Complexity: 1 Mod          Elizabeth B. Migdalia Dk PT, DPT Acute Rehabilitation Services Pager [phone] Office [phone]   [place] 06/07/2020, 3:20 PM

## 2020-06-07 NOTE — Progress Notes (Signed)
Initial Nutrition Assessment  DOCUMENTATION CODES:  Not applicable  INTERVENTION:  Add Boost Breeze po TID, each supplement provides 250 kcal and 9 grams of protein.  Add 30 ml Prostat po TID, each supplement provides 100 kcal and 15 grams of protein.   Add MVI with minerals daily.  NUTRITION DIAGNOSIS:  Increased nutrient needs related to wound healing as evidenced by estimated needs.  GOAL:  Patient will meet greater than or equal to 90% of their needs  MONITOR:  PO intake,Supplement acceptance,Skin  REASON FOR ASSESSMENT:  Consult Wound healing  ASSESSMENT:  Pt with PMH of R foot first ray nonhealing amputation who has had progressive wound dehiscence and a tunneling ulcer which extends to midfoot now admitted for R foot osteomyelitis. Pt PMH includes COVID-19, depression, DM 1 uncontrolled since age 39, fibromyalgia, gastroparesis, GERD, and HTN.  Per Epic, pt ate 100% of dinner on both 3/7 and 3/8. Also per Epic, pt had emesis x2 yesterday after surgery. Epic reports a steady weight increase over the past three months.  Pt noted to have moderate malnutrition related to social/environmental circumstances from mild muscle and fat depletion during previous admission in late February, noted in previous RD note. Pt is likely to be at risk still.  Spoke with pt at bedside very briefly. Pt whimpering, crying, and shaking from pain and nausea. She was given pain medication via nursing during visit. Pt was able to say she would like Boost Breeze and would be willing to try Prostat for extra protein for wound healing.  Encouraged intake of supplements and meals once feeling able for protein needs.  Relevant Medications: lasix, SSI, lantus, reglan BID, Remeron, Protonix, Miralax, Senokot, oxycodone Labs: reviewed; Glucose 152 HbA1c: 6.9% (05/2020)   NUTRITION - FOCUSED PHYSICAL EXAM: Unable to perform d/t pt being in severe pain and having extreme nausea (being given meds at the  time by RN)  Diet Order:   Diet Order            Diet Carb Modified Fluid consistency: Thin; Room service appropriate? Yes  Diet effective now                EDUCATION NEEDS:  Not appropriate for education at this time  Skin:  Skin Assessment: Skin Integrity Issues: (R leg BKA)  Last BM:  06/07/20 - Type 6  Height:  Ht Readings from Last 1 Encounters:  06/06/20 5' 7.01" (1.702 m)   Weight:  Wt Readings from Last 1 Encounters:  06/07/20 86.1 kg   Ideal Body Weight:  61.3 kg  BMI:  Body mass index is 29.72 kg/m.  Estimated Nutritional Needs:  Kcal:  1900-2100 Protein:  100-115 grams Fluid:  >2 L  Derrel Nip, RD, LDN Registered Dietitian After Hours/Weekend Pager # in [place]

## 2020-06-07 NOTE — Evaluation (Signed)
Occupational Therapy Evaluation Patient Details Name: [name] MRN: [id number] DOB: 1990-08-27 Today's Date: 06/07/2020    History of Present Illness [name] is a 30 y.o. female who presents with nonhealing right foot first ray amputation.  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  Patient has undergone wound care and has improved granulation tissue around the chronic draining sinus tract but still has a deep infection. 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 Pt recently hospitalized and reports requiring assist from spouse for transfers, dressing, and bathing tasks. Pt reports difficulty using RW (does not fit in bathroom) and uses wheelchair in the home for mobility primarily. Pt presents now, very tearful throughout session with encouragement provided. Pt able to demo scoot transfer to drop arm recliner at min guard. Will likely need more physical assist for RW use or stand pivot attempts. Educated pt on compensatory strategies for LB ADLs. With uses of lateral leans, pt requires Min A for LB ADLs. Anticipate pt to progress quickly - would benefit from 24/7 support at DC, as well as HH therapy follow-up. Plan to trial RW, progress standing and LB ADLs as appropriate during next session.     Follow Up Recommendations  Home health OT;Supervision/Assistance - 24 hour    Equipment Recommendations  None recommended by OT    Recommendations for Other Services       Precautions / Restrictions Precautions Precautions: Fall Precaution Comments: wound vac Restrictions Weight Bearing Restrictions: Yes RLE Weight Bearing: Non weight bearing      Mobility Bed Mobility Overal bed mobility: Needs Assistance Bed Mobility: Supine to Sit;Sit to Supine     Supine to sit: Supervision Sit to supine: Supervision   General bed mobility comments: Supervision for safety though no  physical assist needed    Transfers Overall transfer level: Needs assistance Equipment used: 1 person hand held assist Transfers: Lateral/Scoot Transfers          Lateral/Scoot Transfers: Min guard General transfer comment: min guard for scoot transfer to drop arm recliner. Deferred RW trial though likely to need +2 person assist for safety    Balance Overall balance assessment: Needs assistance Sitting-balance support: Feet supported Sitting balance-Leahy Scale: Good Sitting balance - Comments: EOB without UE support                                   ADL either performed or assessed with clinical judgement   ADL Overall ADL's : Needs assistance/impaired Eating/Feeding: Set up;Sitting   Grooming: Set up;Sitting;Wash/dry face   Upper Body Bathing: Set up;Sitting   Lower Body Bathing: Minimal assistance;Sitting/lateral leans   Upper Body Dressing : Set up;Sitting   Lower Body Dressing: Minimal assistance;Sitting/lateral leans       Toileting- Clothing Manipulation and Hygiene: Minimal assistance;Sitting/lateral lean         General ADL Comments: Guided pt in scoot transfer with pt able to complete without physical assist. Educated in compensatory strategies for ADLs after amputation.     Vision Patient Visual Report: No change from baseline Vision Assessment?: No apparent visual deficits     Perception     Praxis      Pertinent Vitals/Pain Pain Assessment: No/denies pain Pain Descriptors / Indicators: Crying Pain Intervention(s): Monitored during session;Premedicated before session     Hand  Dominance Right   Extremity/Trunk Assessment Upper Extremity Assessment Upper Extremity Assessment: Overall WFL for tasks assessed   Lower Extremity Assessment Lower Extremity Assessment: Defer to PT evaluation   Cervical / Trunk Assessment Cervical / Trunk Assessment: Normal   Communication Communication Communication: No difficulties (very  tearful)   Cognition Arousal/Alertness: Awake/alert Behavior During Therapy: Flat affect (very tearful) Overall Cognitive Status: Within Functional Limits for tasks assessed                                 General Comments: Pt tearful throughout session with encouragement provided. Self limiting at times   General Comments  on 3 L O2 on entry, titrated to 1 L O2 with O2 WFL. Trialed on RA with SpO2 functional during tasks but at rest in chair, down to 86% on RA - RN notified. Educated on positioning and movement of residual limb    Exercises     Shoulder Instructions      Home Living Family/patient expects to be discharged to:: Private residence Living Arrangements: Spouse/significant other;Other relatives Available Help at Discharge: Family;Available 24 hours/day;Available PRN/intermittently;Friend(s) Type of Home: House Home Access: Stairs to enter [employer] of Steps: 4 Entrance Stairs-Rails: Can reach both Home Layout: One level     Bathroom Shower/Tub: Occupational psychologist: Standard Bathroom Accessibility: No   Home Equipment: Environmental consultant - 2 wheels;Wheelchair - [employer];Shower seat          Prior Functioning/Environment Level of Independence: Needs assistance  Gait / Transfers Assistance Needed: hopping limited transfers, husband assist with transfers and getting out of bed,  use of WC in house (does not fit in bathroom) ADL's / Homemaking Assistance Needed: husband assists with bathing and LB dressing. pt reports wc/walker unable to fit in bathroom, uses BSC and sponge bathes with assist   Comments: 88 yo daughter at home, family assist with child care        OT Problem List: Decreased strength;Decreased activity tolerance;Impaired balance (sitting and/or standing);Decreased knowledge of use of DME or AE;Cardiopulmonary status limiting activity      OT Treatment/Interventions: Self-care/ADL training;Therapeutic  exercise;Energy conservation;DME and/or AE instruction;Therapeutic activities;Patient/family education;Balance training    OT Goals(Current goals can be found in the care plan section) Acute Rehab OT Goals Patient Stated Goal: go home, decrease nausea OT Goal Formulation: With patient Time For Goal Achievement: 06/21/20 Potential to Achieve Goals: Good ADL Goals Pt Will Perform Lower Body Bathing: with set-up;sitting/lateral leans Pt Will Perform Lower Body Dressing: with set-up;sitting/lateral leans Pt Will Transfer to Toilet: with min assist;stand pivot transfer;bedside commode Pt Will Perform Toileting - Clothing Manipulation and hygiene: with set-up;sitting/lateral leans  OT Frequency: Min 2X/week   Barriers to D/C:            Co-evaluation PT/OT/SLP Co-Evaluation/Treatment: Yes Reason for Co-Treatment: For patient/therapist safety;To address functional/ADL transfers;Other (comment) (pt tearful, to ease emotional distress) PT goals addressed during session: Mobility/safety with mobility OT goals addressed during session: ADL's and self-care      AM-PAC OT "6 Clicks" Daily Activity     Outcome Measure Help from another person eating meals?: A Little Help from another person taking care of personal grooming?: A Little Help from another person toileting, which includes using toliet, bedpan, or urinal?: A Little Help from another person bathing (including washing, rinsing, drying)?: A Little Help from another person to put on and taking off regular upper body clothing?: A  Little Help from another person to put on and taking off regular lower body clothing?: A Little 6 Click Score: 18   End of Session Equipment Utilized During Treatment: Oxygen Nurse Communication: Mobility status;Other (comment) (O2)  Activity Tolerance: Patient tolerated treatment well Patient left: in chair;with call bell/phone within reach;with chair alarm set  OT Visit Diagnosis: Unsteadiness on feet  (R26.81);Other abnormalities of gait and mobility (R26.89);Muscle weakness (generalized) (M62.81)                Time: 1001-1041 OT Time Calculation (min): 40 min Charges:  OT General Charges $OT Visit: 1 Visit OT Evaluation $OT Eval Moderate Complexity: 1 Mod OT Treatments $Therapeutic Activity: 8-22 mins  Malachy Chamber, OTR/L Acute Rehab Services Office: [phone]  Layla Maw 06/07/2020, 11:44 AM

## 2020-06-07 NOTE — Progress Notes (Signed)
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 Was on dapto/cipro-->dapto/ceftriaxone up to this admission There was no surgical or swab culture of the foot previously Dr [name] has evaluated today and planned a bka  #chest ct bilateral pulm opacity #?CAP #hx HFpEF patient is treated for CAP this admisison. In 05/2020 during the admission, she had hypoxemic resp failure (multiple covid pcr was negative). Chart mention probable aspiration at that time. There was also a concern for dapto associated pulmonary toxicity. She underwent on 2/13 a bronchoscopy (culture/fungal cx/afb cx negative) and cell count with minimal eosinophil. She was continued on dapto at that time. There was no chest ct from then to compare to this admission. She was also treated with diuresis for presumed HFpEF exacerbation as well. Changes are rather persistent and atypical for aspiration. Could benefit from pulmonary input. 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 finish ceftriaxone/azith for 5 days on 3/12 2. No further need for RLE inefction treatment now s/p bka 3. Have discussed with primary team regarding chest ct finding; willd defer pulm discussion to primary team 4.   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 (feeding supplement) PROSource Plus  30 mL Oral TID BM  . azithromycin  500 mg Oral Daily  . busPIRone  5 mg Oral TID  . carvedilol  12.5 mg Oral BID WC  . Chlorhexidine Gluconate Cloth  6 each Topical Daily  . enoxaparin (LOVENOX) injection  40 mg Subcutaneous Daily  . feeding supplement  1 Container Oral TID BM  . furosemide  60 mg Intravenous BID  . insulin aspart  0-6 Units Subcutaneous TID WC  . insulin glargine  5 Units Subcutaneous Daily  . mouth rinse  15 mL Mouth Rinse BID  . metoCLOPramide  5 mg Oral BID  . mirtazapine  30 mg Oral QHS  . multivitamin with minerals  1 tablet Oral Daily  . pantoprazole  40 mg Oral BID  . polyethylene glycol  17 g Oral Daily  . senna-docusate  2 tablet Oral QHS   Continuous Infusions: . sodium chloride 10  mL/hr at 06/07/20 0910  . cefTRIAXone (ROCEPHIN)  IV 2 g (06/06/20 1810)   PRN Meds:.acetaminophen **OR** acetaminophen, cyclobenzaprine, HYDROmorphone (DILAUDID) injection, labetalol, LORazepam, ondansetron (ZOFRAN) IV, oxyCODONE, sodium chloride flush   SUBJECTIVE: S/p bka No sob No further o2 requirement  Review of Systems: ROS Other ros negative  Allergies  Allergen Reactions  . Other Anaphylaxis     Reaction to [place] nuts   . Lactose Intolerance (Gi) Diarrhea    OBJECTIVE: Vitals:   06/07/20 0000 06/07/20 0145 06/07/20 0435 06/07/20 0730  BP: (!) 173/96  (!) 180/89 (!) 168/88  Pulse: 90  100 95  Resp: 15  20 19   Temp: 97.6 F (36.4 C)  98 F (36.7 C) 98 F (36.7 C)  TempSrc: Oral  Oral Axillary  SpO2: 99%  98% 97%  Weight:  86.1 kg    Height:       Body mass index is 29.72 kg/m.  Physical Exam No distress, conversant heent normocephalic Neck supple cv rrr no mrg Lungs scattered rhonchi; normal respiratory effort abd s/nt Ext s/p right bka; dressing clean/dry Neuro nonfocal Psych alert/oriented  Lab Results Lab Results  Component Value Date   WBC 7.8 06/07/2020   HGB 7.5 (L) 06/07/2020   HCT 23.1 (L) 06/07/2020   MCV 89.5 06/07/2020   PLT 232 06/07/2020    Lab Results  Component Value Date   CREATININE 1.37 (H) 06/07/2020   BUN 24 (H) 06/07/2020   NA 135 06/07/2020   K 4.5 06/07/2020   CL 103 06/07/2020   CO2 25 06/07/2020    Lab Results  Component Value Date   ALT 29 06/07/2020   AST 21 06/07/2020   ALKPHOS 181 (H) 06/07/2020   BILITOT 0.5 06/07/2020     Microbiology: Recent Results (from the past 240 hour(s))  Blood culture (routine x 2)     Status: None (Preliminary result)   Collection Time: 06/04/20  3:47 AM   Specimen: BLOOD  Result Value Ref Range Status   Specimen Description   Final    BLOOD BLOOD LEFT FOREARM Performed at [facility], [place] [address] [place], [place] [address]    Special Requests   Final    BOTTLES DRAWN AEROBIC AND ANAEROBIC Blood Culture adequate volume Performed at [facility] [address] [place], [place] [address]    Culture   Final    NO GROWTH 2 DAYS Performed at [facility] [address] [place], [place] [address]    Report Status PENDING  Incomplete  Blood culture (routine x 2)     Status: None (Preliminary result)   Collection Time: 06/04/20   5:07 AM   Specimen: BLOOD  Result Value Ref Range Status   Specimen Description   Final    BLOOD BLOOD RIGHT HAND Performed at [facility] [address], [place], [place] [address]    Special Requests   Final    BOTTLES DRAWN AEROBIC AND ANAEROBIC Blood Culture adequate volume Performed at [facility] [address], [place], [place] [address]    Culture   Final    NO GROWTH 2 DAYS Performed at [facility] [address] [place], [place] [address]    Report Status PENDING  Incomplete  Resp Panel by RT-PCR (Flu A&B, Covid) Nasopharyngeal Swab     Status: None   Collection Time: 06/04/20  7:18 AM   Specimen: Nasopharyngeal Swab; Nasopharyngeal(NP) swabs in vial transport  medium  Result Value Ref Range Status   SARS Coronavirus 2 by RT PCR NEGATIVE NEGATIVE Final    Comment: (NOTE) SARS-CoV-2 target nucleic acids are NOT DETECTED.  The SARS-CoV-2 RNA is generally detectable in upper respiratory specimens during the acute phase of infection. The lowest concentration of SARS-CoV-2 viral copies this assay can detect is 138 copies/mL. A negative result does not preclude SARS-Cov-2 infection and should not be used as the sole basis for treatment or other patient management decisions. A negative result may occur with  improper specimen collection/handling, submission of specimen other than nasopharyngeal swab, presence of viral mutation(s) within the areas targeted by this assay, and inadequate number of viral copies(<138 copies/mL). A negative result must be combined with clinical observations, patient history, and epidemiological information. The expected result is Negative.  Fact Sheet for Patients:  EntrepreneurPulse.com.au  Fact Sheet for Healthcare Providers:  IncredibleEmployment.be  This test is no t yet approved or cleared by the [place] FDA and  has been authorized for detection  and/or diagnosis of SARS-CoV-2 by FDA under an Emergency Use Authorization (EUA). This EUA will remain  in effect (meaning this test can be used) for the duration of the COVID-19 declaration under Section 564(b)(1) of the Act, 21 U.S.C.section 360bbb-3(b)(1), unless the authorization is terminated  or revoked sooner.       Influenza A by PCR NEGATIVE NEGATIVE Final   Influenza B by PCR NEGATIVE NEGATIVE Final    Comment: (NOTE) The Xpert Xpress SARS-CoV-2/FLU/RSV plus assay is intended as an aid in the diagnosis of influenza from Nasopharyngeal swab specimens and should not be used as a sole basis for treatment. Nasal washings and aspirates are unacceptable for Xpert Xpress SARS-CoV-2/FLU/RSV testing.  Fact Sheet for Patients: EntrepreneurPulse.com.au  Fact Sheet for Healthcare Providers: IncredibleEmployment.be  This test is not yet approved or cleared by the [place] FDA and has been authorized for detection and/or diagnosis of SARS-CoV-2 by FDA under an Emergency Use Authorization (EUA). This EUA will remain in effect (meaning this test can be used) for the duration of the COVID-19 declaration under Section 564(b)(1) of the Act, 21 U.S.C. section 360bbb-3(b)(1), unless the authorization is terminated or revoked.  Performed at [facility], [place] [address], [place], [place] [address]   Urine culture     Status: None   Collection Time: 06/04/20  8:13 AM   Specimen: In/Out Cath Urine  Result Value Ref Range Status   Specimen Description   Final    IN/OUT CATH URINE Performed at [facility] [address], [place], [place] [address]    Special Requests   Final    NONE Performed at [facility], [place] [address], [place], [place] [address]    Culture   Final    NO GROWTH Performed at [facility], [place] [address], [place], [place] [address]    Report Status 06/05/2020  FINAL  Final  MRSA PCR Screening     Status: None   Collection Time: 06/05/20  3:59 PM   Specimen: Nasal Mucosa; Nasopharyngeal  Result Value Ref Range Status   MRSA by PCR NEGATIVE NEGATIVE Final    Comment:        The GeneXpert MRSA Assay (FDA approved for NASAL specimens only), is one component of a comprehensive MRSA colonization surveillance program. It is not intended to diagnose MRSA infection nor to guide or monitor treatment for MRSA infections. Performed at [facility], [address] [place], [place] [address]     Serology:  Imaging: If present, new imagings (plain films, ct scans, and mri) have been personally visualized and interpreted; radiology reports have been reviewed. Decision making incorporated into the Impression / Recommendations.  3/07 foot ct 1. Surgical wound at the medial aspect of the forefoot related to recent first ray resection. Erosive changes within the distal aspect of the medial cuneiform is highly suspicious for osteomyelitis. 2. Redemonstration of Lisfranc fracture-dislocation with dorsal and lateral dislocation of the second metatarsal base at the second TMT joint. There is also dorsal subluxation of the third metatarsal base at the third TMT joint. 3. Numerous fractures at the TMT joints including within the second and third metatarsal bases as well as at the medial and intermediate cuneiform bones, and distal cuboid. 4. No organized or rim enhancing fluid collection  3/07 chest cta 1. No evidence of pulmonary embolism. 2. Small to moderate-sized right-sided pleural effusion and trace left-sided pleural effusion. 3. Extensive bilateral airspace opacities and interstitial thickening, nonspecific with broad differential considerations including multi lobar infection (including atypical etiology) and pulmonary edema. 4. Borderline cardiomegaly. 5. Diffuse body wall anasarca. 6. Prominent though non pathologically enlarged  mediastinal, bilateral hilar and axillary lymph nodes, nonspecific though presumably reactive in etiology  2/16 echo 1. Left ventricular ejection fraction, by estimation, is 55 to 60%. The  left ventricle has normal function. The left ventricle has no regional  wall motion abnormalities. There is mild left ventricular hypertrophy.  Left ventricular diastolic parameters  are consistent with Grade II diastolic dysfunction (pseudonormalization).  Elevated left atrial pressure.  2. Right ventricular systolic function is normal. The right ventricular  size is normal. There is mildly elevated pulmonary artery systolic  pressure.  3. The mitral valve is normal in structure. No evidence of mitral valve  regurgitation.  4. The aortic valve is tricuspid. Aortic valve regurgitation is not  visualized. No aortic stenosis is present.   Jabier Mutton, [facility] [place] [phone] pager    06/07/2020, 10:38 AM

## 2020-06-07 NOTE — Progress Notes (Signed)
Patient is postop day 1 status post right transtibial amputation.  She is lying in bed she says she has had some pain but appears comfortable right now.  Vital signs stable afebrile wound VAC is in place 0 cc in the canister she has 1 green check   We will mobilize with PT today will most likely need skilled nursing

## 2020-06-07 NOTE — Progress Notes (Addendum)
PROGRESS NOTE        PATIENT DETAILS Name: [name] Age: 30 y.o. Sex: female Date of Birth: 09/28/1990 Admit Date: 06/04/2020 Admitting Physician Jonnie Finner, DO KAJ:GOTLXB, Algis Greenhouse, MD  Brief Narrative: Patient is a 30 y.o. female with a history of DM-1, CKD stage IIIa, chronic diastolic heart failure-underwent right first ray amputation on 1/14 for right foot osteomyelitis-subsequently hospitalized from 2/12-2/24 for severe hypoxia requiring intubation due to aspiration pneumonia and wound dehiscence of amputation site-discharged home on IV antibiotics-presented with right foot osteomyelitis and anasarca. See below for further details  Significant events: 1/14>> right ray amputation by Dr. 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 3/7>> CTA chest: No PE, right pleural effusion, extensive bilateral airspace opacities, anasarca  Antimicrobial therapy: Rocephin: 3/7>> Zithromax: 3/7>>  Microbiology data: 2/13>> respiratory virus panel: Negative 2/13>> BAL: Cultures negative 2/13>> BAL FEB/fungal culture: Pending 2/14>> blood cultures: Negative 3/7>> blood cultures: No growth 3/7>> urine culture: No growth  Procedures : 1/14>> right ray amputation by Dr. [name] 2/13>> bronchoscopy 2/13-2/15>> intubation/extubation 2/16-2/19>> reintubated/extubated 3/9>> right BKA (Dr [name])  Consults: Orthopedics ID  DVT Prophylaxis : SCDs Start: 06/06/20 1609 enoxaparin  (LOVENOX) injection 40 mg Start: 06/04/20 1200   Subjective: Had significant pain at the operative site-some nausea and vomiting overnight as well.   Assessment/Plan: Acute osteomyelitis of right midfoot-history of right first ray amputation on 1/14 with subsequent wound dehiscence: Unfortunately-has developed osteomyelitis in spite of IV antibiotics and wound care-orthopedics following-underwent right BKA on 3/9.  Significant postoperative pain overnight-some nausea and vomiting-have switched narcotics with Dilaudid-have increased oral oxycodone to 10 mg-add scheduled Tylenol.  Have placed on bowel regimen with MiraLAX senna-await further recommendations from orthopedics/PT/OT.  Watch closely.  Acute hypoxic respiratory failure due to HFpEF exacerbation +/-residual pneumonia: Improved-very minimal hypoxemia-on 1 L of oxygen this morning-suspect x-rays/CT imaging-related to sequelae of recent aspiration pneumonia requiring intubation.  Will continue to follow closely and discussed with PCCM over the next few days.  In the meantime-remains on IV antibiotics until 10/12-still remains significantly volume overloaded-have increased Lasix to 60 mg IV twice daily.  Follow weights/intake/output/electrolytes closely.  Anasarca: Due to a combination of HFpEF, diabetic nephropathy/hypoalbuminemia.  Remains volume overloaded-continue IV diuretics-follow volume status/weights.    CKD stage IIIb: Creatinine close to baseline-follow closely.  Normocytic anemia: Due to acute illness/CKD-no evidence of blood loss-follow.  Hyperkalemia: Resolved after Lokelma.  HTN: Stable-continue Coreg  DM-1: Hypoglycemic episodes yesterday-continue SSI-decrease Lantus to 5 units.  Follow and adjust.    Recent Labs    06/06/20 2126 06/06/20 2202 06/07/20 0727  GLUCAP 63* 109* 152*   Suspected history of gastroparesis: On Reglan-continues to have vomiting overnight-likely due to narcotics-switch as needed Zofran to  Phenergan.  Depression/anxiety: Stable continue BuSpar/Remeron  GERD: Continue PPI  Anemia: Secondary to CKD-acute illness-no evidence of GI bleeding-follow and transfuse if hemoglobin <7   Diet: Diet Order            Diet Carb Modified Fluid consistency: Thin; Room service appropriate? Yes  Diet effective now                  Code Status: Full code   Family Communication: Spouse-[name]-[phone]-left voicemail on 3/9  Disposition Plan: Status is: Inpatient  Remains inpatient appropriate because:Inpatient level of care appropriate due to severity of illness   Dispo: The patient is from: Home              Anticipated d/c is to: Home              Patient currently is not medically stable to d/c.   Difficult to place patient No   Barriers to Discharge: Right foot osteomyelitis-hypoxia due to PNA-on IV antibiotics-transmetatarsal amputation scheduled for 3/9  Antimicrobial agents: Anti-infectives (From admission, onward)   Start     Dose/Rate Route Frequency Ordered Stop   06/06/20 1315  ceFAZolin (ANCEF) IVPB 2g/100 mL premix  Status:  Discontinued        2 g 200 mL/hr over 30 Minutes Intravenous On call to O.R. 06/05/20 1221 06/06/20 1543   06/04/20 1130  cefTRIAXone (ROCEPHIN) 2 g in sodium chloride 0.9 % 100 mL IVPB        2 g 200 mL/hr over 30 Minutes Intravenous Every 24 hours 06/04/20 1122 06/08/20 2359   06/04/20 1130  azithromycin (ZITHROMAX) tablet 500 mg        500 mg Oral Daily 06/04/20 1122 06/08/20 2359       Time spent: 25 minutes-Greater than 50% of this time was spent in counseling, explanation of diagnosis, planning of further management, and coordination of care.  MEDICATIONS: Scheduled Meds: . (feeding supplement) PROSource Plus  30 mL Oral TID BM  . azithromycin  500 mg Oral Daily  . busPIRone  5 mg Oral TID  . carvedilol  12.5 mg Oral BID WC  . Chlorhexidine Gluconate Cloth  6 each Topical Daily  . enoxaparin (LOVENOX) injection  40  mg Subcutaneous Daily  . feeding supplement  1 Container Oral TID BM  . furosemide  60 mg Intravenous BID  . insulin aspart  0-6 Units Subcutaneous TID WC  . insulin glargine  5 Units Subcutaneous Daily  . mouth rinse  15 mL Mouth Rinse BID  . metoCLOPramide  5 mg Oral BID  . mirtazapine  30 mg Oral QHS  . multivitamin with minerals  1 tablet Oral Daily  . pantoprazole  40 mg Oral BID  . polyethylene glycol  17 g Oral Daily  . senna-docusate  2 tablet Oral QHS   Continuous Infusions: . sodium chloride 10 mL/hr at 06/07/20 0910  . cefTRIAXone (ROCEPHIN)  IV 2 g (06/06/20 1810)   PRN Meds:.acetaminophen **OR** acetaminophen, cyclobenzaprine, HYDROmorphone (DILAUDID) injection, labetalol, LORazepam, oxyCODONE, promethazine, sodium chloride flush   PHYSICAL EXAM: Vital signs: Vitals:   06/07/20 0000 06/07/20 0145 06/07/20 0435 06/07/20 0730  BP: (!) 173/96  (!) 180/89 (!) 168/88  Pulse: 90  100 95  Resp: _0 Temp: 97.6 F (36.4 C)  98 F (36.7 C) 98 F (36.7 C)  TempSrc: Oral  Oral Axillary  SpO2: 99%  98% 97%  Weight:  86.1 kg    Height:       Filed Weights   06/05/20 0500 06/06/20 1244 06/07/20 0145  Weight: 85 kg 85 kg 86.1 kg   Body mass index is 29.72 kg/m.   Gen Exam:Alert awake-not in any distress HEENT:atraumatic, normocephalic Chest: B/L clear to auscultation anteriorly CVS:S1S2 regular Abdomen:soft non tender, non  distended Extremities: Left BKA-2+ pitting edema Neurology: Non focal Skin: no rash  I have personally reviewed following labs and imaging studies  LABORATORY DATA: CBC: Recent Labs  Lab 06/04/20 0420 06/05/20 0350 06/05/20 2302 06/06/20 0258 06/07/20 0345  WBC 8.9 8.1  --  6.5 7.8  NEUTROABS 6.2  --   --   --   --   HGB 7.9* 7.7* 8.5* 8.5* 7.5*  HCT 26.6* 24.7* 27.0* 26.4* 23.1*  MCV 96.7 93.6  --  90.1 89.5  PLT 236 220  --  202 203    Basic Metabolic Panel: Recent Labs  Lab 06/04/20 0522 06/04/20 0747 06/04/20 2205  06/05/20 0350 06/06/20 0258 06/07/20 0345  NA 136  --  131* 133* 130* 135  K 7.0* 6.3* 5.8* 5.5* 4.5 4.5  CL 110  --  103 103 102 103  CO2 22  --  20* 22 21* 25  GLUCOSE 77  --  285* 278* 350* 218*  BUN 33*  --  27* 27* 29* 24*  CREATININE 1.30*  --  1.40* 1.44* 1.48* 1.37*  CALCIUM 8.3*  --  8.3* 8.5* 7.8* 8.0*  PHOS  --   --  4.4  --   --   --     GFR: Estimated Creatinine Clearance: 67.7 mL/min (A) (by C-G formula based on SCr of 1.37 mg/dL (H)).  Liver Function Tests: Recent Labs  Lab 06/04/20 0522 06/04/20 2205 06/05/20 0350 06/06/20 0258 06/07/20 0345  AST 34  --  _0 ALT 58*  --  47* 38 29  ALKPHOS 253*  --  234* 215* 181*  BILITOT 0.6  --  0.5 0.4 0.5  PROT 6.8  --  6.2* 6.1* 5.7*  ALBUMIN 2.0* 1.7* 1.6* 1.6* 1.5*   No results for input(s): LIPASE, AMYLASE in the last 168 hours. No results for input(s): AMMONIA in the last 168 hours.  Coagulation Profile: Recent Labs  Lab 06/04/20 0420  INR 1.2    Cardiac Enzymes: No results for input(s): CKTOTAL, CKMB, CKMBINDEX, TROPONINI in the last 168 hours.  BNP (last 3 results) No results for input(s): PROBNP in the last 8760 hours.  Lipid Profile: No results for input(s): CHOL, HDL, LDLCALC, TRIG, CHOLHDL, LDLDIRECT in the last 72 hours.  Thyroid Function Tests: No results for input(s): TSH, T4TOTAL, FREET4, T3FREE, THYROIDAB in the last 72 hours.  Anemia Panel: No results for input(s): VITAMINB12, FOLATE, FERRITIN, TIBC, IRON, RETICCTPCT in the last 72 hours.  Urine analysis:    Component Value Date/Time   COLORURINE YELLOW 06/04/2020 0813   APPEARANCEUR CLEAR 06/04/2020 0813   LABSPEC 1.012 06/04/2020 0813   PHURINE 5.0 06/04/2020 0813   GLUCOSEU NEGATIVE 06/04/2020 0813   HGBUR SMALL (A) 06/04/2020 0813   BILIRUBINUR NEGATIVE 06/04/2020 0813   BILIRUBINUR negative 01/02/2015 1717   KETONESUR NEGATIVE 06/04/2020 0813   PROTEINUR 100 (A) 06/04/2020 0813   UROBILINOGEN 0.2 01/13/2015 0223    NITRITE NEGATIVE 06/04/2020 0813   LEUKOCYTESUR NEGATIVE 06/04/2020 0813    Sepsis Labs: Lactic Acid, Venous    Component Value Date/Time   LATICACIDVEN 0.6 06/04/2020 0420    MICROBIOLOGY: Recent Results (from the past 240 hour(s))  Blood culture (routine x 2)     Status: None (Preliminary result)   Collection Time: 06/04/20  3:47 AM   Specimen: BLOOD  Result Value Ref Range Status   Specimen Description   Final    BLOOD BLOOD LEFT FOREARM Performed at [facility],  [place] [address] [place], [place] [address]    Special Requests   Final    BOTTLES DRAWN AEROBIC AND ANAEROBIC Blood Culture adequate volume Performed at [facility] [address] [place], [place] [address]    Culture   Final    NO GROWTH 2 DAYS Performed at [place] [facility] [address], [place], [place] [address]    Report Status PENDING  Incomplete  Blood culture (routine x 2)     Status: None (Preliminary result)   Collection Time: 06/04/20  5:07 AM   Specimen: BLOOD  Result Value Ref Range Status   Specimen Description   Final    BLOOD BLOOD RIGHT HAND Performed at [facility] [address] [place], [place] [address]    Special Requests   Final    BOTTLES DRAWN AEROBIC AND ANAEROBIC Blood Culture adequate volume Performed at [facility] [address] [place], [place] [address]    Culture   Final    NO GROWTH 2 DAYS Performed at [facility] [address] [place], [place] [address]    Report Status PENDING  Incomplete  Resp Panel by RT-PCR (Flu A&B, Covid) Nasopharyngeal Swab     Status: None   Collection Time: 06/04/20  7:18 AM   Specimen: Nasopharyngeal Swab; Nasopharyngeal(NP) swabs in vial transport medium  Result Value Ref Range Status   SARS Coronavirus 2 by RT PCR NEGATIVE NEGATIVE Final    Comment: (NOTE) SARS-CoV-2 target nucleic acids are NOT DETECTED.  The SARS-CoV-2 RNA is generally  detectable in upper respiratory specimens during the acute phase of infection. The lowest concentration of SARS-CoV-2 viral copies this assay can detect is 138 copies/mL. A negative result does not preclude SARS-Cov-2 infection and should not be used as the sole basis for treatment or other patient management decisions. A negative result may occur with  improper specimen collection/handling, submission of specimen other than nasopharyngeal swab, presence of viral mutation(s) within the areas targeted by this assay, and inadequate number of viral copies(<138 copies/mL). A negative result must be combined with clinical observations, patient history, and epidemiological information. The expected result is Negative.  Fact Sheet for Patients:  EntrepreneurPulse.com.au  Fact Sheet for Healthcare Providers:  IncredibleEmployment.be  This test is no t yet approved or cleared by the [place] FDA and  has been authorized for detection and/or diagnosis of SARS-CoV-2 by FDA under an Emergency Use Authorization (EUA). This EUA will remain  in effect (meaning this test can be used) for the duration of the COVID-19 declaration under Section 564(b)(1) of the Act, 21 U.S.C.section 360bbb-3(b)(1), unless the authorization is terminated  or revoked sooner.       Influenza A by PCR NEGATIVE NEGATIVE Final   Influenza B by PCR NEGATIVE NEGATIVE Final    Comment: (NOTE) The Xpert Xpress SARS-CoV-2/FLU/RSV plus assay is intended as an aid in the diagnosis of influenza from Nasopharyngeal swab specimens and should not be used as a sole basis for treatment. Nasal washings and aspirates are unacceptable for Xpert Xpress SARS-CoV-2/FLU/RSV testing.  Fact Sheet for Patients: EntrepreneurPulse.com.au  Fact Sheet for Healthcare Providers: IncredibleEmployment.be  This test is not yet approved or cleared by the [place] FDA  and has been authorized for detection and/or diagnosis of SARS-CoV-2 by FDA under an Emergency Use Authorization (EUA). This EUA will remain in effect (meaning this test can be used) for the duration of the COVID-19 declaration under Section 564(b)(1)  of the Act, 21 U.S.C. section 360bbb-3(b)(1), unless the authorization is terminated or revoked.  Performed at [facility], [place] [address] [place], [place] [address]   Urine culture     Status: None   Collection Time: 06/04/20  8:13 AM   Specimen: In/Out Cath Urine  Result Value Ref Range Status   Specimen Description   Final    IN/OUT CATH URINE Performed at [place] [address] [place], [place] [address]    Special Requests   Final    NONE Performed at [facility], [place] [address], [place], [place] [address]    Culture   Final    NO GROWTH Performed at [facility], [place] [address], [place], [place] [address]    Report Status 06/05/2020 FINAL  Final  MRSA PCR Screening     Status: None   Collection Time: 06/05/20  3:59 PM   Specimen: Nasal Mucosa; Nasopharyngeal  Result Value Ref Range Status   MRSA by PCR NEGATIVE NEGATIVE Final    Comment:        The GeneXpert MRSA Assay (FDA approved for NASAL specimens only), is one component of a comprehensive MRSA colonization surveillance program. It is not intended to diagnose MRSA infection nor to guide or monitor treatment for MRSA infections. Performed at [facility], [place] [address], [place],  [address]     RADIOLOGY STUDIES/RESULTS: No results found.   LOS: 3 days   Oren Binet, MD  [facility]    To contact the attending provider between 7A-7P or the covering provider during after hours 7P-7A, please log into the web site [email or web address] and access using universal [place] password for that web site. If you do not have the password, please call the hospital  operator.  06/07/2020, 11:53 AM

## 2020-06-08 ENCOUNTER — Inpatient Hospital Stay: Payer: 59 | Admitting: Infectious Diseases

## 2020-06-08 LAB — COMPREHENSIVE METABOLIC PANEL
ALT: 22 U/L (ref 0–44)
AST: 17 U/L (ref 15–41)
Albumin: 1.6 g/dL — ABNORMAL LOW (ref 3.5–5.0)
Alkaline Phosphatase: 168 U/L — ABNORMAL HIGH (ref 38–126)
Anion gap: 6 (ref 5–15)
BUN: 23 mg/dL — ABNORMAL HIGH (ref 6–20)
CO2: 24 mmol/L (ref 22–32)
Calcium: 7.9 mg/dL — ABNORMAL LOW (ref 8.9–10.3)
Chloride: 104 mmol/L (ref 98–111)
Creatinine, Ser: 1.26 mg/dL — ABNORMAL HIGH (ref 0.44–1.00)
GFR, Estimated: 59 mL/min — ABNORMAL LOW (ref >60)
Glucose, Bld: 242 mg/dL — ABNORMAL HIGH (ref 70–99)
Potassium: 4.2 mmol/L (ref 3.5–5.1)
Sodium: 134 mmol/L — ABNORMAL LOW (ref 135–145)
Total Bilirubin: 0.4 mg/dL (ref 0.3–1.2)
Total Protein: 5.9 g/dL — ABNORMAL LOW (ref 6.5–8.1)

## 2020-06-08 LAB — CBC
HCT: 23.2 % — ABNORMAL LOW (ref 36.0–46.0)
Hemoglobin: 7.1 g/dL — ABNORMAL LOW (ref 12.0–15.0)
MCH: 28.1 pg (ref 26.0–34.0)
MCHC: 30.6 g/dL (ref 30.0–36.0)
MCV: 91.7 fL (ref 80.0–100.0)
Platelets: 216 10*3/uL (ref 150–400)
RBC: 2.53 MIL/uL — ABNORMAL LOW (ref 3.87–5.11)
RDW: 15.4 % (ref 11.5–15.5)
WBC: 8 10*3/uL (ref 4.0–10.5)
nRBC: 0 % (ref 0.0–0.2)

## 2020-06-08 LAB — SURGICAL PATHOLOGY

## 2020-06-08 LAB — MAGNESIUM: Magnesium: 1.5 mg/dL — ABNORMAL LOW (ref 1.7–2.4)

## 2020-06-08 MED ORDER — FUROSEMIDE 10 MG/ML IJ SOLN
80.0000 mg | Freq: Two times a day (BID) | INTRAMUSCULAR | Status: DC
  Administered 2020-06-08 – 2020-06-10 (×5): 80 mg via INTRAVENOUS
  Filled 2020-06-08 (×5): qty 8

## 2020-06-08 MED ORDER — MAGNESIUM SULFATE 4 GM/100ML IV SOLN
4.0000 g | Freq: Once | INTRAVENOUS | Status: AC
  Administered 2020-06-08: 4 g via INTRAVENOUS
  Filled 2020-06-08: qty 100

## 2020-06-08 MED ORDER — METOLAZONE 5 MG PO TABS
5.0000 mg | ORAL_TABLET | Freq: Once | ORAL | Status: AC
  Administered 2020-06-08: 5 mg via ORAL
  Filled 2020-06-08: qty 1

## 2020-06-08 MED ORDER — SENNOSIDES-DOCUSATE SODIUM 8.6-50 MG PO TABS
2.0000 | ORAL_TABLET | Freq: Every evening | ORAL | Status: DC | PRN
  Filled 2020-06-08: qty 2

## 2020-06-08 NOTE — Progress Notes (Signed)
PROGRESS NOTE        PATIENT DETAILS Name: [name] Age: 30 y.o. Sex: female Date of Birth: [birth year] Admit Date: 06/04/2020 Admitting Physician Jonnie Finner, DO GGE:[id number], Algis Greenhouse, MD  Brief Narrative: Patient is a 30 y.o. female with a history of DM-1, CKD stage IIIa, chronic diastolic heart failure-underwent right first ray amputation on 1/14 for right foot osteomyelitis-subsequently hospitalized from 2/12-2/24 for severe hypoxia requiring intubation due to aspiration pneumonia and wound dehiscence of amputation site-discharged home on IV antibiotics-presented with right foot osteomyelitis and anasarca. See below for further details  Significant events: 1/14>> right ray amputation by Dr. 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 3/7>> CTA chest: No PE, right pleural effusion, extensive bilateral airspace opacities, anasarca  Antimicrobial therapy: Rocephin: 3/7>> Zithromax: 3/7>>  Microbiology data: 2/13>> respiratory virus panel: Negative 2/13>> BAL: Cultures negative 2/13>> BAL FEB/fungal culture: Pending 2/14>> blood cultures: Negative 3/7>> blood cultures: No growth 3/7>> urine culture: No growth  Procedures : 1/14>> right ray amputation by Dr. [name] 2/13>> bronchoscopy 2/13-2/15>> intubation/extubation 2/16-2/19>> reintubated/extubated 3/9>> right BKA (Dr [name])  Consults: Orthopedics ID  DVT Prophylaxis : SCDs Start: 06/06/20 1609 enoxaparin  (LOVENOX) injection 40 mg Start: 06/04/20 1200   Subjective: On room air-lying comfortably in bed.  No vomiting.  Still has significant swelling in her thighs.  Assessment/Plan: Acute osteomyelitis of right midfoot-history of right first ray amputation on 1/14 with subsequent wound dehiscence: Unfortunately-has developed osteomyelitis in spite of IV antibiotics and wound care-orthopedics following-underwent right BKA on 3/9.  She continues to have significant postoperative pain but it is much better than the past few days.  Continue as needed narcotics-ambulate with PT.  Orthopedics following with recommendations to discharge home when stable on Praveena pump.  Acute hypoxic respiratory failure due to HFpEF exacerbation +/-residual pneumonia: On room air-suspect changes on imaging studies related to recent sequelae of pneumonia-needs repeat imaging in the next few weeks.  Volume status has improved-continue IV Lasix-see below.   Anasarca: Due to a combination of HFpEF, diabetic nephropathy/hypoalbuminemia.  Volume status is improved-weights inaccurate (has wound pump/shrinker-and numerous blankets on top of bed)-increase Lasix to 80 mg twice daily-we will give 1 dose of metolazone-follow volume status/weights/intake/output.  CKD stage IIIb: Creatinine close to baseline-follow closely.  Normocytic anemia: Due to acute illness/CKD-no evidence of blood loss-follow.  Hyperkalemia: Resolved after Lokelma.  HTN: Stable-continue Coreg  DM-1: Hypoglycemic episodes yesterday-continue SSI-decrease Lantus to 5 units.  Follow and adjust.    Recent Labs    06/06/20 2126 06/06/20 2202 06/07/20 0727  GLUCAP 63* 109* 152*   Suspected history of gastroparesis: On Reglan-continues to have vomiting overnight-likely due to narcotics-switch as needed Zofran to Phenergan.  Depression/anxiety: Stable continue BuSpar/Remeron  GERD: Continue PPI  Anemia: Secondary to CKD-acute illness-no evidence of GI  bleeding-follow and transfuse if hemoglobin <7   Diet: Diet Order            Diet Carb Modified Fluid consistency: Thin; Room service appropriate? Yes  Diet effective now  Code Status: Full code   Family Communication: Spouse-[name]-[phone]-left voicemail on 3/9  Disposition Plan: Status is: Inpatient  Remains inpatient appropriate because:Inpatient level of care appropriate due to severity of illness   Dispo: The patient is from: Home              Anticipated d/c is to: Home              Patient currently is not medically stable to d/c.   Difficult to place patient No   Barriers to Discharge: Right foot osteomyelitis-hypoxia due to PNA-on IV antibiotics-transmetatarsal amputation scheduled for 3/9  Antimicrobial agents: Anti-infectives (From admission, onward)   Start     Dose/Rate Route Frequency Ordered Stop   06/06/20 1315  ceFAZolin (ANCEF) IVPB 2g/100 mL premix  Status:  Discontinued        2 g 200 mL/hr over 30 Minutes Intravenous On call to O.R. 06/05/20 1221 06/06/20 1543   06/04/20 1130  cefTRIAXone (ROCEPHIN) 2 g in sodium chloride 0.9 % 100 mL IVPB        2 g 200 mL/hr over 30 Minutes Intravenous Every 24 hours 06/04/20 1122 06/08/20 2359   06/04/20 1130  azithromycin (ZITHROMAX) tablet 500 mg        500 mg Oral Daily 06/04/20 1122 06/08/20 1129       Time spent: 25 minutes-Greater than 50% of this time was spent in counseling, explanation of diagnosis, planning of further management, and coordination of care.  MEDICATIONS: Scheduled Meds: . (feeding supplement) PROSource Plus  30 mL Oral TID BM  . acetaminophen  1,000 mg Oral Q8H  . busPIRone  5 mg Oral TID  . carvedilol  12.5 mg Oral BID WC  . Chlorhexidine Gluconate Cloth  6 each Topical Daily  . enoxaparin (LOVENOX) injection  40 mg Subcutaneous Daily  . feeding supplement  1 Container Oral TID BM  . furosemide  80 mg Intravenous BID  . insulin aspart  0-6 Units  Subcutaneous TID WC  . insulin glargine  5 Units Subcutaneous Daily  . mouth rinse  15 mL Mouth Rinse BID  . metoCLOPramide  5 mg Oral BID  . mirtazapine  30 mg Oral QHS  . multivitamin with minerals  1 tablet Oral Daily  . pantoprazole  40 mg Oral BID  . polyethylene glycol  17 g Oral Daily   Continuous Infusions: . sodium chloride 10 mL/hr at 06/07/20 0910  . cefTRIAXone (ROCEPHIN)  IV 2 g (06/08/20 1329)   PRN Meds:.cyclobenzaprine, HYDROmorphone (DILAUDID) injection, labetalol, LORazepam, oxyCODONE, promethazine, senna-docusate, sodium chloride flush   PHYSICAL EXAM: Vital signs: Vitals:   06/08/20 0346 06/08/20 0500 06/08/20 0653 06/08/20 0736  BP: (!) 165/82   (!) 196/91  Pulse: 96  87 94  Resp: _0 Temp: 98.9 F (37.2 C)   98 F (36.7 C)  TempSrc: Oral     SpO2: 97%  98% 99%  Weight:  85.8 kg 89.1 kg   Height:       Filed Weights   06/07/20 0145 06/08/20 0500 06/08/20 0653  Weight: 86.1 kg 85.8 kg 89.1 kg   Body mass index is 30.76 kg/m.   Gen Exam:Alert awake-not in any distress HEENT:atraumatic, normocephalic Chest: B/L clear to auscultation anteriorly CVS:S1S2 regular Abdomen:soft non tender, non distended Extremities:++edema Neurology: Non focal Skin: no rash I have personally reviewed following labs and imaging studies  LABORATORY DATA: CBC: Recent Labs  Lab 06/04/20 0420 06/05/20 0350 06/05/20 2302 06/06/20 0258  06/07/20 0345 06/08/20 0320  WBC 8.9 8.1  --  6.5 7.8 8.0  NEUTROABS 6.2  --   --   --   --   --   HGB 7.9* 7.7* 8.5* 8.5* 7.5* 7.1*  HCT 26.6* 24.7* 27.0* 26.4* 23.1* 23.2*  MCV 96.7 93.6  --  90.1 89.5 91.7  PLT 236 220  --  202 232 191    Basic Metabolic Panel: Recent Labs  Lab 06/04/20 2205 06/05/20 0350 06/06/20 0258 06/07/20 0345 06/08/20 0320  NA 131* 133* 130* 135 134*  K 5.8* 5.5* 4.5 4.5 4.2  CL 103 103 102 103 104  CO2 20* 22 21* 25 24  GLUCOSE 285* 278* 350* 218* 242*  BUN 27* 27* 29* 24* 23*   CREATININE 1.40* 1.44* 1.48* 1.37* 1.26*  CALCIUM 8.3* 8.5* 7.8* 8.0* 7.9*  MG  --   --   --   --  1.5*  PHOS 4.4  --   --   --   --     GFR: Estimated Creatinine Clearance: 74.8 mL/min (A) (by C-G formula based on SCr of 1.26 mg/dL (H)).  Liver Function Tests: Recent Labs  Lab 06/04/20 0522 06/04/20 2205 06/05/20 0350 06/06/20 0258 06/07/20 0345 06/08/20 0320  AST 34  --  _0 ALT 58*  --  47* 38 29 22  ALKPHOS 253*  --  234* 215* 181* 168*  BILITOT 0.6  --  0.5 0.4 0.5 0.4  PROT 6.8  --  6.2* 6.1* 5.7* 5.9*  ALBUMIN 2.0* 1.7* 1.6* 1.6* 1.5* 1.6*   No results for input(s): LIPASE, AMYLASE in the last 168 hours. No results for input(s): AMMONIA in the last 168 hours.  Coagulation Profile: Recent Labs  Lab 06/04/20 0420  INR 1.2    Cardiac Enzymes: No results for input(s): CKTOTAL, CKMB, CKMBINDEX, TROPONINI in the last 168 hours.  BNP (last 3 results) No results for input(s): PROBNP in the last 8760 hours.  Lipid Profile: No results for input(s): CHOL, HDL, LDLCALC, TRIG, CHOLHDL, LDLDIRECT in the last 72 hours.  Thyroid Function Tests: No results for input(s): TSH, T4TOTAL, FREET4, T3FREE, THYROIDAB in the last 72 hours.  Anemia Panel: No results for input(s): VITAMINB12, FOLATE, FERRITIN, TIBC, IRON, RETICCTPCT in the last 72 hours.  Urine analysis:    Component Value Date/Time   COLORURINE YELLOW 06/04/2020 0813   APPEARANCEUR CLEAR 06/04/2020 0813   LABSPEC 1.012 06/04/2020 0813   PHURINE 5.0 06/04/2020 0813   GLUCOSEU NEGATIVE 06/04/2020 0813   HGBUR SMALL (A) 06/04/2020 0813   BILIRUBINUR NEGATIVE 06/04/2020 0813   BILIRUBINUR negative 01/02/2015 1717   KETONESUR NEGATIVE 06/04/2020 0813   PROTEINUR 100 (A) 06/04/2020 0813   UROBILINOGEN 0.2 01/13/2015 0223   NITRITE NEGATIVE 06/04/2020 0813   LEUKOCYTESUR NEGATIVE 06/04/2020 0813    Sepsis Labs: Lactic Acid, Venous    Component Value Date/Time   LATICACIDVEN 0.6 06/04/2020 0420     MICROBIOLOGY: Recent Results (from the past 240 hour(s))  Blood culture (routine x 2)     Status: None (Preliminary result)   Collection Time: 06/04/20  3:47 AM   Specimen: BLOOD  Result Value Ref Range Status   Specimen Description   Final    BLOOD BLOOD LEFT FOREARM Performed at [facility], [place] [address] [place], [place] [address]    Special Requests   Final    BOTTLES DRAWN AEROBIC AND ANAEROBIC Blood Culture adequate volume Performed at [facility], [place] [address] [place], [place] [address]    Culture   Final    NO GROWTH 4 DAYS Performed at [facility], [place] [address], [place], [place] [address]    Report Status PENDING  Incomplete  Blood culture (routine x 2)     Status: None (Preliminary result)   Collection Time: 06/04/20  5:07 AM   Specimen: BLOOD  Result Value Ref Range Status   Specimen Description   Final    BLOOD BLOOD RIGHT HAND Performed at [place] [address], [place], [place] [address]    Special Requests   Final    BOTTLES DRAWN AEROBIC AND ANAEROBIC Blood Culture adequate volume Performed at [place] [address], [place], [place] [address]    Culture   Final    NO GROWTH 4 DAYS Performed at [facility], [place] [address], [place], [place] [address]    Report Status PENDING  Incomplete  Resp Panel by RT-PCR (Flu A&B, Covid) Nasopharyngeal Swab     Status: None   Collection Time: 06/04/20  7:18 AM   Specimen: Nasopharyngeal Swab; Nasopharyngeal(NP) swabs in vial transport medium  Result Value Ref Range Status   SARS Coronavirus 2 by RT PCR NEGATIVE NEGATIVE Final    Comment: (NOTE) SARS-CoV-2 target nucleic acids are NOT DETECTED.  The SARS-CoV-2 RNA is generally detectable in upper respiratory specimens during the acute phase of infection. The lowest concentration of SARS-CoV-2 viral copies this assay can detect is 138 copies/mL. A  negative result does not preclude SARS-Cov-2 infection and should not be used as the sole basis for treatment or other patient management decisions. A negative result may occur with  improper specimen collection/handling, submission of specimen other than nasopharyngeal swab, presence of viral mutation(s) within the areas targeted by this assay, and inadequate number of viral copies(<138 copies/mL). A negative result must be combined with clinical observations, patient history, and epidemiological information. The expected result is Negative.  Fact Sheet for Patients:  EntrepreneurPulse.com.au  Fact Sheet for Healthcare Providers:  IncredibleEmployment.be  This test is no t yet approved or cleared by the [place] FDA and  has been authorized for detection and/or diagnosis of SARS-CoV-2 by FDA under an Emergency Use Authorization (EUA). This EUA will remain  in effect (meaning this test can be used) for the duration of the COVID-19 declaration under Section 564(b)(1) of the Act, 21 U.S.C.section 360bbb-3(b)(1), unless the authorization is terminated  or revoked sooner.       Influenza A by PCR NEGATIVE NEGATIVE Final   Influenza B by PCR NEGATIVE NEGATIVE Final    Comment: (NOTE) The Xpert Xpress SARS-CoV-2/FLU/RSV plus assay is intended as an aid in the diagnosis of influenza from Nasopharyngeal swab specimens and should not be used as a sole basis for treatment. Nasal washings and aspirates are unacceptable for Xpert Xpress SARS-CoV-2/FLU/RSV testing.  Fact Sheet for Patients: EntrepreneurPulse.com.au  Fact Sheet for Healthcare Providers: IncredibleEmployment.be  This test is not yet approved or cleared by the [place] FDA and has been authorized for detection and/or diagnosis of SARS-CoV-2 by FDA under an Emergency Use Authorization (EUA). This EUA will remain in effect (meaning this test can  be used) for the duration of the COVID-19 declaration under Section 564(b)(1) of the Act, 21 U.S.C. section 360bbb-3(b)(1), unless the authorization is terminated or revoked.  Performed at [facility], [place] [address] [place], [place] [address]   Urine culture  Status: None   Collection Time: 06/04/20  8:13 AM   Specimen: In/Out Cath Urine  Result Value Ref Range Status   Specimen Description   Final    IN/OUT CATH URINE Performed at [facility], [place] [address], [place], [place] [address]    Special Requests   Final    NONE Performed at [facility], [place] [address], [place], [place] [address]    Culture   Final    NO GROWTH Performed at [facility], [place] [address] [place], [place] [address]    Report Status 06/05/2020 FINAL  Final  MRSA PCR Screening     Status: None   Collection Time: 06/05/20  3:59 PM   Specimen: Nasal Mucosa; Nasopharyngeal  Result Value Ref Range Status   MRSA by PCR NEGATIVE NEGATIVE Final    Comment:        The GeneXpert MRSA Assay (FDA approved for NASAL specimens only), is one component of a comprehensive MRSA colonization surveillance program. It is not intended to diagnose MRSA infection nor to guide or monitor treatment for MRSA infections. Performed at [facility], [place] [address], [place], [place] [address]     RADIOLOGY STUDIES/RESULTS: No results found.   LOS: 4 days   Oren Binet, MD  [facility]    To contact the attending provider between 7A-7P or the covering provider during after hours 7P-7A, please log into the web site [email or web address] and access using universal [place] password for that web site. If you do not have the password, please call the hospital operator.  06/08/2020, 3:31 PM

## 2020-06-08 NOTE — Plan of Care (Signed)

## 2020-06-08 NOTE — Progress Notes (Signed)
Patient is postop day 2 status post below-knee amputation.  She is lying in bed appears comfortable has been working with physical therapy  Canister is functioning 0 cc. Patient plan to follow-up with [place] in the office in 1 week after discharge will transition to [facility] pump

## 2020-06-09 ENCOUNTER — Inpatient Hospital Stay (HOSPITAL_COMMUNITY): Payer: 59

## 2020-06-09 LAB — CULTURE, BLOOD (ROUTINE X 2)
Culture: NO GROWTH
Culture: NO GROWTH
Special Requests: ADEQUATE
Special Requests: ADEQUATE

## 2020-06-09 LAB — COMPREHENSIVE METABOLIC PANEL
ALT: 24 U/L (ref 0–44)
AST: 32 U/L (ref 15–41)
Albumin: 1.5 g/dL — ABNORMAL LOW (ref 3.5–5.0)
Alkaline Phosphatase: 159 U/L — ABNORMAL HIGH (ref 38–126)
Anion gap: 7 (ref 5–15)
BUN: 27 mg/dL — ABNORMAL HIGH (ref 6–20)
CO2: 25 mmol/L (ref 22–32)
Calcium: 8.1 mg/dL — ABNORMAL LOW (ref 8.9–10.3)
Chloride: 101 mmol/L (ref 98–111)
Creatinine, Ser: 1.12 mg/dL — ABNORMAL HIGH (ref 0.44–1.00)
GFR, Estimated: >60 mL/min (ref >60)
Glucose, Bld: 333 mg/dL — ABNORMAL HIGH (ref 70–99)
Potassium: 4.3 mmol/L (ref 3.5–5.1)
Sodium: 133 mmol/L — ABNORMAL LOW (ref 135–145)
Total Bilirubin: 0.3 mg/dL (ref 0.3–1.2)
Total Protein: 6 g/dL — ABNORMAL LOW (ref 6.5–8.1)

## 2020-06-09 LAB — CBC
HCT: 21 % — ABNORMAL LOW (ref 36.0–46.0)
Hemoglobin: 6.8 g/dL — CL (ref 12.0–15.0)
MCH: 28.2 pg (ref 26.0–34.0)
MCHC: 32.4 g/dL (ref 30.0–36.0)
MCV: 87.1 fL (ref 80.0–100.0)
Platelets: 231 10*3/uL (ref 150–400)
RBC: 2.41 MIL/uL — ABNORMAL LOW (ref 3.87–5.11)
RDW: 15.2 % (ref 11.5–15.5)
WBC: 8 10*3/uL (ref 4.0–10.5)
nRBC: 0 % (ref 0.0–0.2)

## 2020-06-09 LAB — HEMOGLOBIN AND HEMATOCRIT, BLOOD
HCT: 24.5 % — ABNORMAL LOW (ref 36.0–46.0)
Hemoglobin: 8.1 g/dL — ABNORMAL LOW (ref 12.0–15.0)

## 2020-06-09 LAB — MAGNESIUM: Magnesium: 2.2 mg/dL (ref 1.7–2.4)

## 2020-06-09 LAB — PREPARE RBC (CROSSMATCH)

## 2020-06-09 IMAGING — DX DG CHEST 1V PORT SAME DAY
1 series · 1 of 1 positions shown · non-contrast
Comparison: [DATE] chest radiograph and chest CT.

CLINICAL DATA: Shortness of breath

EXAM:
PORTABLE CHEST 1 VIEW

[chest]
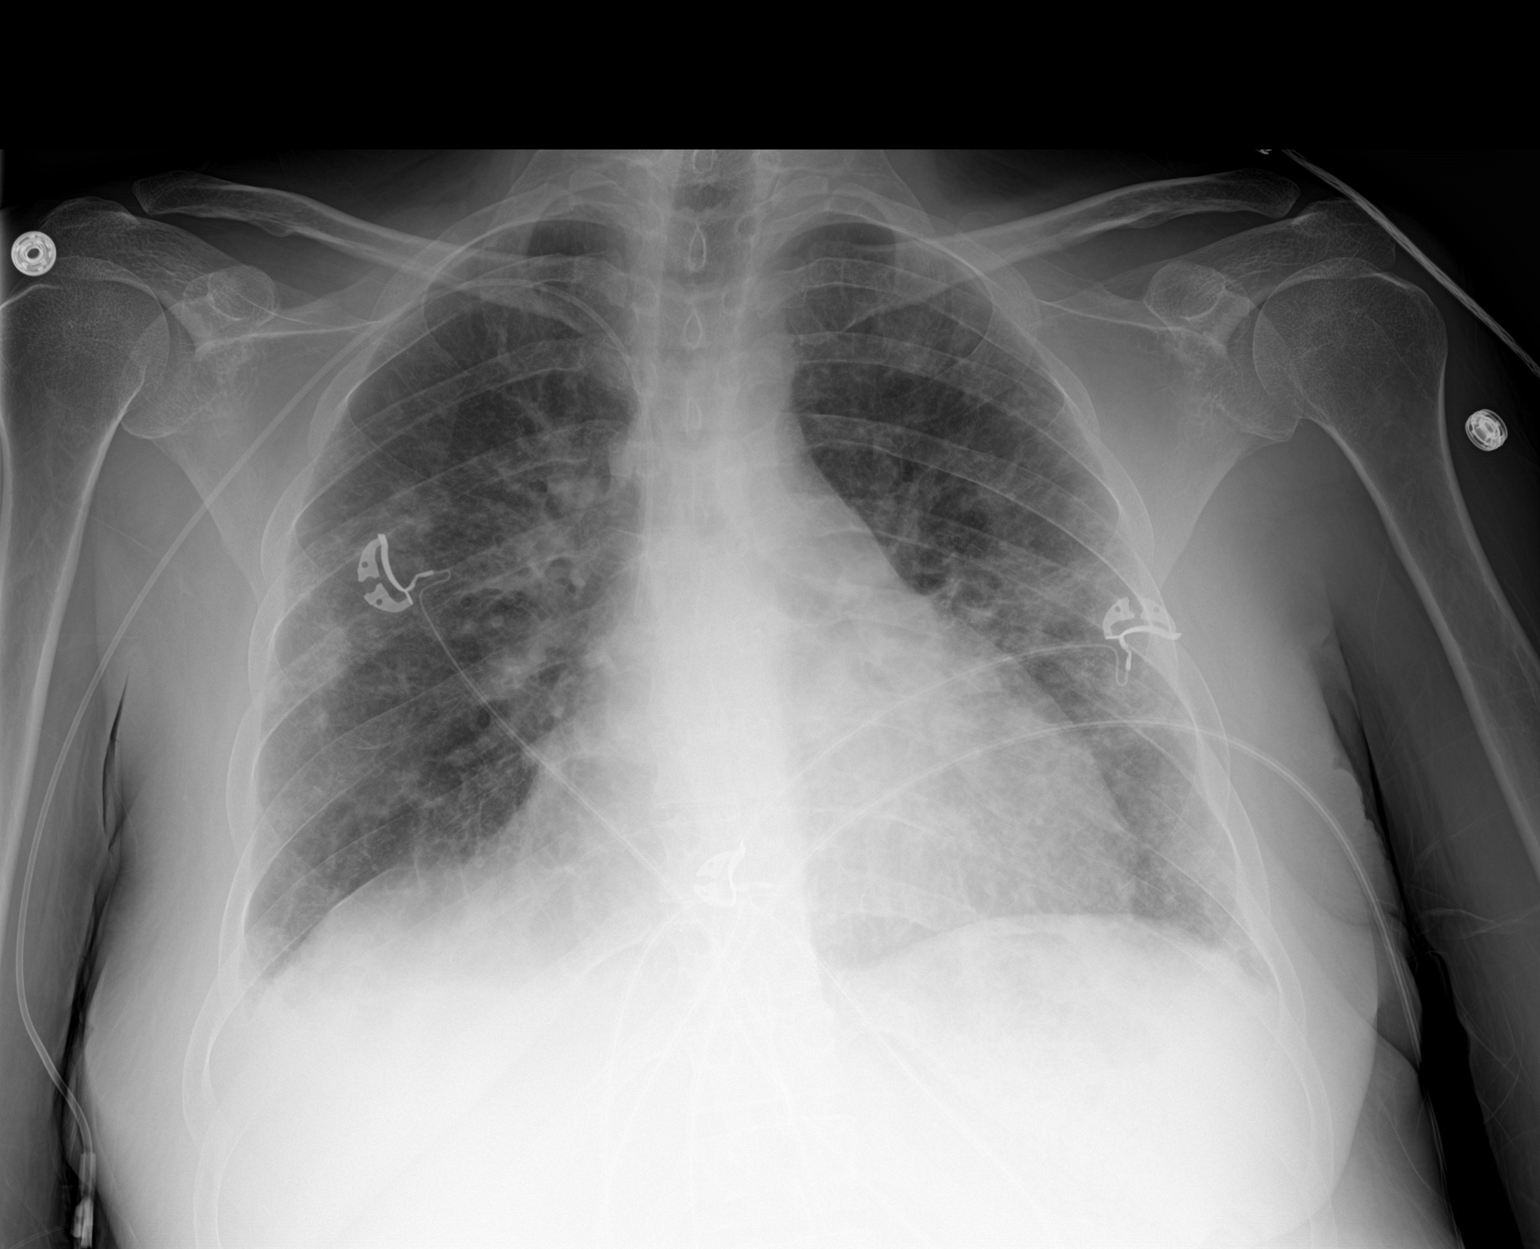

[1 of 1 positions shown; findings below may reference images not displayed]

FINDINGS: There is patchy airspace opacity in each mid lung region. There has
been clearing of ill-defined airspace opacity from the bases
compared to most recent chest radiograph. Previously noted pleural
effusions seen on CT are not appreciable by radiography. There is
mild cardiomegaly with pulmonary vascularity normal. No adenopathy.
Central catheter tip is in the superior cava. No pneumothorax. No
bone lesions.
IMPRESSION: Patchy airspace opacity, likely representing foci of pneumonia, in
each mid lung. Interval clearing of ill-defined opacity from the
lung bases. No new opacity evident. Stable cardiac prominence. No
pleural effusions evident by radiography. Central catheter tip in
superior vena cava.

## 2020-06-09 MED ORDER — METOLAZONE 2.5 MG PO TABS
2.5000 mg | ORAL_TABLET | Freq: Once | ORAL | Status: AC
  Administered 2020-06-09: 2.5 mg via ORAL
  Filled 2020-06-09: qty 1

## 2020-06-09 MED ORDER — SODIUM CHLORIDE 0.9% IV SOLUTION: Freq: Once | INTRAVENOUS | Status: DC

## 2020-06-09 NOTE — Progress Notes (Addendum)
PROGRESS NOTE        PATIENT DETAILS Name: [name] Age: 30 y.o. Sex: female Date of Birth: 1991/02/28 Admit Date: 06/04/2020 Admitting Physician Jonnie Finner, DO PFX:[id number], Algis Greenhouse, MD  Brief Narrative: Patient is a 30 y.o. female with a history of DM-1, CKD stage IIIa, chronic diastolic heart failure-underwent right first ray amputation on 1/14 for right foot osteomyelitis-subsequently hospitalized from 2/12-2/24 for severe hypoxia requiring intubation due to aspiration pneumonia and wound dehiscence of amputation site-discharged home on IV antibiotics-presented with right foot osteomyelitis and anasarca. See below for further details  Significant events: 1/14>> right ray amputation by Dr. 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 3/7>> CTA chest: No PE, right pleural effusion, extensive bilateral airspace opacities, anasarca  Antimicrobial therapy: Rocephin: 3/7>>3/11 Zithromax: 3/7>>3/11  Microbiology data: 2/13>> respiratory virus panel: Negative 2/13>> BAL: Cultures negative 2/13>> BAL FEB/fungal culture: Pending 2/14>> blood cultures: Negative 3/7>> blood cultures: No growth 3/7>> urine culture: No growth  Procedures : 1/14>> right ray amputation by Dr. [name] 2/13>> bronchoscopy 2/13-2/15>> intubation/extubation 2/16-2/19>> reintubated/extubated 3/9>> right BKA (Dr [name])  Consults: Orthopedics ID  DVT Prophylaxis : SCDs Start: 06/06/20 1609 enoxaparin  (LOVENOX) injection 40 mg Start: 06/04/20 1200   Subjective: Continues to have pain in the stump site-but better than the past few days.  No nausea vomiting.  Assessment/Plan: Acute osteomyelitis of right midfoot-history of right first ray amputation on 1/14 with subsequent wound dehiscence: S/p right BKA on 3/9-postop pain much better-orthopedics following-with recommendations to discharge home once stable on Praveena pump.  Acute hypoxic respiratory failure due to HFpEF exacerbation +/-residual pneumonia: Hypoxia has resolved-on room air.  Chest x-ray/CT chest-still significant infiltrates but I suspect this is a sequelae of recent pneumonia.  Continue diuresis with IV Lasix.  Has finished a course of IV Rocephin/Zithromax.  Will need repeat imaging in 2-3 weeks-if continues to show persistent infiltrates-may need pulmonary evaluation.  Anasarca: Due to a combination of HFpEF, diabetic nephropathy/hypoalbuminemia.  Volume status has markedly improved-however she still has edema in her thighs (mostly lying down)-continue with IV Lasix-repeat 1 more dose of metolazone today.  Intake/output is not accurate-her weight over the past several days was also not accurate because it was being done with multiple other things on the bed.    CKD stage IIIb: Creatinine close to baseline-follow closely.  Normocytic anemia: Due to acute illness/CKD-no evidence of blood loss-drop in hemoglobin this morning is likely due to acute illness-being transfused 1 unit of PRBC today-repeat CBC tomorrow morning.  Hyperkalemia: Resolved after Lokelma.  HTN: Stable-continue Coreg  DM-1: Continues to have episodes of hypoglycemia-for now continue with 5 units of Lantus-and SSI.    Recent Labs    06/06/20 2126 06/06/20 2202 06/07/20 0727  GLUCAP 63* 109* 152*   Suspected history of gastroparesis: On Reglan-continues to have vomiting overnight-likely due to narcotics-switch as needed Zofran to  Phenergan.  Depression/anxiety: Stable continue BuSpar/Remeron  GERD: Continue PPI   Diet: Diet Order  Diet Carb Modified Fluid consistency: Thin; Room service appropriate? Yes  Diet effective now                  Code Status: Full code   Family Communication: Spouse-[name]-left voicemail on 3/10  Disposition Plan: Status is: Inpatient  Remains inpatient appropriate because:Inpatient level of care appropriate due to severity of illness   Dispo: The patient is from: Home              Anticipated d/c is to: Home              Patient currently is not medically stable to d/c.   Difficult to place patient No   Barriers to Discharge: Right foot osteomyelitis-hypoxia due to PNA-on IV antibiotics-transmetatarsal amputation scheduled for 3/9  Antimicrobial agents: Anti-infectives (From admission, onward)   Start     Dose/Rate Route Frequency Ordered Stop   06/06/20 1315  ceFAZolin (ANCEF) IVPB 2g/100 mL premix  Status:  Discontinued        2 g 200 mL/hr over 30 Minutes Intravenous On call to O.R. 06/05/20 1221 06/06/20 1543   06/04/20 1130  cefTRIAXone (ROCEPHIN) 2 g in sodium chloride 0.9 % 100 mL IVPB        2 g 200 mL/hr over 30 Minutes Intravenous Every 24 hours 06/04/20 1122 06/08/20 2359   06/04/20 1130  azithromycin (ZITHROMAX) tablet 500 mg        500 mg Oral Daily 06/04/20 1122 06/08/20 1129       Time spent: 25 minutes-Greater than 50% of this time was spent in counseling, explanation of diagnosis, planning of further management, and coordination of care.  MEDICATIONS: Scheduled Meds: . (feeding supplement) PROSource Plus  30 mL Oral TID BM  . sodium chloride   Intravenous Once  . acetaminophen  1,000 mg Oral Q8H  . busPIRone  5 mg Oral TID  . carvedilol  12.5 mg Oral BID WC  . Chlorhexidine Gluconate Cloth  6 each Topical Daily  . enoxaparin (LOVENOX) injection  40 mg Subcutaneous Daily  . feeding supplement  1 Container Oral  TID BM  . furosemide  80 mg Intravenous BID  . insulin aspart  0-6 Units Subcutaneous TID WC  . insulin glargine  5 Units Subcutaneous Daily  . mouth rinse  15 mL Mouth Rinse BID  . metoCLOPramide  5 mg Oral BID  . mirtazapine  30 mg Oral QHS  . multivitamin with minerals  1 tablet Oral Daily  . pantoprazole  40 mg Oral BID  . polyethylene glycol  17 g Oral Daily   Continuous Infusions: . sodium chloride 10 mL/hr at 06/07/20 0910   PRN Meds:.cyclobenzaprine, HYDROmorphone (DILAUDID) injection, labetalol, LORazepam, oxyCODONE, promethazine, senna-docusate, sodium chloride flush   PHYSICAL EXAM: Vital signs: Vitals:   06/09/20 0811 06/09/20 0847 06/09/20 1047 06/09/20 1219  BP: (!) 164/97 (!) 175/90 (!) 173/92 (!) 159/88  Pulse: 95 88 87 83  Resp: _0 Temp: 99 F (37.2 C) 97.9 F (36.6 C) 98 F (36.7 C) 98.4 F (36.9 C)  TempSrc: Oral Oral Oral Oral  SpO2: 96% 96% 97% 99%  Weight:      Height:       Filed Weights   06/08/20 0500 06/08/20 0653 06/09/20 0343  Weight: 85.8 kg 89.1 kg 83 kg   Body mass index is 28.65 kg/m.  Gen Exam:Alert awake-not in any distress HEENT:atraumatic, normocephalic Chest: B/L clear to auscultation anteriorly CVS:S1S2 regular Abdomen:soft non tender,  non distended Extremities:++ edema-right BKA Neurology: Non focal Skin: no rash  I have personally reviewed following labs and imaging studies  LABORATORY DATA: CBC: Recent Labs  Lab 06/04/20 0420 06/05/20 0350 06/05/20 2302 06/06/20 0258 06/07/20 0345 06/08/20 0320 06/09/20 0415 06/09/20 1302  WBC 8.9 8.1  --  6.5 7.8 8.0 8.0  --   NEUTROABS 6.2  --   --   --   --   --   --   --   HGB 7.9* 7.7*   < > 8.5* 7.5* 7.1* 6.8* 8.1*  HCT 26.6* 24.7*   < > 26.4* 23.1* 23.2* 21.0* 24.5*  MCV 96.7 93.6  --  90.1 89.5 91.7 87.1  --   PLT 236 220  --  202 232 216 231  --    < > = values in this interval not displayed.    Basic Metabolic Panel: Recent Labs  Lab 06/04/20 2205  06/05/20 0350 06/06/20 0258 06/07/20 0345 06/08/20 0320 06/09/20 0415  NA 131* 133* 130* 135 134* 133*  K 5.8* 5.5* 4.5 4.5 4.2 4.3  CL 103 103 102 103 104 101  CO2 20* 22 21* _0 GLUCOSE 285* 278* 350* 218* 242* 333*  BUN 27* 27* 29* 24* 23* 27*  CREATININE 1.40* 1.44* 1.48* 1.37* 1.26* 1.12*  CALCIUM 8.3* 8.5* 7.8* 8.0* 7.9* 8.1*  MG  --   --   --   --  1.5* 2.2  PHOS 4.4  --   --   --   --   --     GFR: Estimated Creatinine Clearance: 81.4 mL/min (A) (by C-G formula based on SCr of 1.12 mg/dL (H)).  Liver Function Tests: Recent Labs  Lab 06/05/20 0350 06/06/20 0258 06/07/20 0345 06/08/20 0320 06/09/20 0415  AST _1 32  ALT 47* 38 _2 ALKPHOS 234* 215* 181* 168* 159*  BILITOT 0.5 0.4 0.5 0.4 0.3  PROT 6.2* 6.1* 5.7* 5.9* 6.0*  ALBUMIN 1.6* 1.6* 1.5* 1.6* 1.5*   No results for input(s): LIPASE, AMYLASE in the last 168 hours. No results for input(s): AMMONIA in the last 168 hours.  Coagulation Profile: Recent Labs  Lab 06/04/20 0420  INR 1.2    Cardiac Enzymes: No results for input(s): CKTOTAL, CKMB, CKMBINDEX, TROPONINI in the last 168 hours.  BNP (last 3 results) No results for input(s): PROBNP in the last 8760 hours.  Lipid Profile: No results for input(s): CHOL, HDL, LDLCALC, TRIG, CHOLHDL, LDLDIRECT in the last 72 hours.  Thyroid Function Tests: No results for input(s): TSH, T4TOTAL, FREET4, T3FREE, THYROIDAB in the last 72 hours.  Anemia Panel: No results for input(s): VITAMINB12, FOLATE, FERRITIN, TIBC, IRON, RETICCTPCT in the last 72 hours.  Urine analysis:    Component Value Date/Time   COLORURINE YELLOW 06/04/2020 0813   APPEARANCEUR CLEAR 06/04/2020 0813   LABSPEC 1.012 06/04/2020 0813   PHURINE 5.0 06/04/2020 0813   GLUCOSEU NEGATIVE 06/04/2020 0813   HGBUR SMALL (A) 06/04/2020 0813   BILIRUBINUR NEGATIVE 06/04/2020 0813   BILIRUBINUR negative 01/02/2015 1717   KETONESUR NEGATIVE 06/04/2020 0813   PROTEINUR 100  (A) 06/04/2020 0813   UROBILINOGEN 0.2 01/13/2015 0223   NITRITE NEGATIVE 06/04/2020 0813   LEUKOCYTESUR NEGATIVE 06/04/2020 0813    Sepsis Labs: Lactic Acid, Venous    Component Value Date/Time   LATICACIDVEN 0.6 06/04/2020 0420    MICROBIOLOGY: Recent Results (from the past 240 hour(s))  Blood culture (routine x 2)  Status: None (Preliminary result)   Collection Time: 06/04/20  3:47 AM   Specimen: BLOOD  Result Value Ref Range Status   Specimen Description   Final    BLOOD BLOOD LEFT FOREARM Performed at [facility] [address] [place], [place] [address]    Special Requests   Final    BOTTLES DRAWN AEROBIC AND ANAEROBIC Blood Culture adequate volume Performed at [facility] [address] [place], [place] [address]    Culture   Final    NO GROWTH 4 DAYS Performed at [facility], [place] [address] [place], [place] [address]    Report Status PENDING  Incomplete  Blood culture (routine x 2)     Status: None (Preliminary result)   Collection Time: 06/04/20  5:07 AM   Specimen: BLOOD  Result Value Ref Range Status   Specimen Description   Final    BLOOD BLOOD RIGHT HAND Performed at [facility] [address] [place], [place] [address]    Special Requests   Final    BOTTLES DRAWN AEROBIC AND ANAEROBIC Blood Culture adequate volume Performed at [facility] [address] [place], [place] [address]    Culture   Final    NO GROWTH 4 DAYS Performed at [facility], [place] [address] [place], [place] [address]    Report Status PENDING  Incomplete  Resp Panel by RT-PCR (Flu A&B, Covid) Nasopharyngeal Swab     Status: None   Collection Time: 06/04/20  7:18 AM   Specimen: Nasopharyngeal Swab; Nasopharyngeal(NP) swabs in vial transport medium  Result Value Ref Range Status   SARS Coronavirus 2 by RT PCR NEGATIVE NEGATIVE Final    Comment: (NOTE) SARS-CoV-2 target nucleic  acids are NOT DETECTED.  The SARS-CoV-2 RNA is generally detectable in upper respiratory specimens during the acute phase of infection. The lowest concentration of SARS-CoV-2 viral copies this assay can detect is 138 copies/mL. A negative result does not preclude SARS-Cov-2 infection and should not be used as the sole basis for treatment or other patient management decisions. A negative result may occur with  improper specimen collection/handling, submission of specimen other than nasopharyngeal swab, presence of viral mutation(s) within the areas targeted by this assay, and inadequate number of viral copies(<138 copies/mL). A negative result must be combined with clinical observations, patient history, and epidemiological information. The expected result is Negative.  Fact Sheet for Patients:  EntrepreneurPulse.com.au  Fact Sheet for Healthcare Providers:  IncredibleEmployment.be  This test is no t yet approved or cleared by the [place] FDA and  has been authorized for detection and/or diagnosis of SARS-CoV-2 by FDA under an Emergency Use Authorization (EUA). This EUA will remain  in effect (meaning this test can be used) for the duration of the COVID-19 declaration under Section 564(b)(1) of the Act, 21 U.S.C.section 360bbb-3(b)(1), unless the authorization is terminated  or revoked sooner.       Influenza A by PCR NEGATIVE NEGATIVE Final   Influenza B by PCR NEGATIVE NEGATIVE Final    Comment: (NOTE) The Xpert Xpress SARS-CoV-2/FLU/RSV plus assay is intended as an aid in the diagnosis of influenza from Nasopharyngeal swab specimens and should not be used as a sole basis for treatment. Nasal washings and aspirates are unacceptable for Xpert Xpress SARS-CoV-2/FLU/RSV testing.  Fact Sheet for Patients: EntrepreneurPulse.com.au  Fact Sheet for Healthcare Providers: IncredibleEmployment.be  This  test is not yet approved or cleared by the [place]  States FDA and has been authorized for detection and/or diagnosis of SARS-CoV-2 by FDA under an Emergency Use Authorization (EUA). This EUA will remain in effect (meaning this test can be used) for the duration of the COVID-19 declaration under Section 564(b)(1) of the Act, 21 U.S.C. section 360bbb-3(b)(1), unless the authorization is terminated or revoked.  Performed at [facility], [place] [address], [place], [place] [address]   Urine culture     Status: None   Collection Time: 06/04/20  8:13 AM   Specimen: In/Out Cath Urine  Result Value Ref Range Status   Specimen Description   Final    IN/OUT CATH URINE Performed at [facility] [address], [place], [place] [address]    Special Requests   Final    NONE Performed at [facility], [place] [address], [place], [place] [address]    Culture   Final    NO GROWTH Performed at [facility], [place] [address] [place], [place] [address]    Report Status 06/05/2020 FINAL  Final  MRSA PCR Screening     Status: None   Collection Time: 06/05/20  3:59 PM   Specimen: Nasal Mucosa; Nasopharyngeal  Result Value Ref Range Status   MRSA by PCR NEGATIVE NEGATIVE Final    Comment:        The GeneXpert MRSA Assay (FDA approved for NASAL specimens only), is one component of a comprehensive MRSA colonization surveillance program. It is not intended to diagnose MRSA infection nor to guide or monitor treatment for MRSA infections. Performed at [facility], [place] [address] [place], [place] [address]     RADIOLOGY STUDIES/RESULTS: DG Chest Port 1V same Day  Result Date: 06/09/2020 CLINICAL DATA:  Shortness of breath EXAM: PORTABLE CHEST 1 VIEW COMPARISON:  June 04, 2020 chest radiograph and chest CT. FINDINGS: There is patchy airspace opacity in each mid lung region. There has been clearing of ill-defined airspace opacity  from the bases compared to most recent chest radiograph. Previously noted pleural effusions seen on CT are not appreciable by radiography. There is mild cardiomegaly with pulmonary vascularity normal. No adenopathy. Central catheter tip is in the superior cava. No pneumothorax. No bone lesions. IMPRESSION: Patchy airspace opacity, likely representing foci of pneumonia, in each mid lung. Interval clearing of ill-defined opacity from the lung bases. No new opacity evident. Stable cardiac prominence. No pleural effusions evident by radiography. Central catheter tip in superior vena cava. Electronically Signed   By: Lowella Grip III M.D.   On: 06/09/2020 08:45     LOS: 5 days   Oren Binet, MD  [facility]    To contact the attending provider between 7A-7P or the covering provider during after hours 7P-7A, please log into the web site [email or web address] and access using universal [place] password for that web site. If you do not have the password, please call the hospital operator.  06/09/2020, 1:52 PM

## 2020-06-09 NOTE — Progress Notes (Signed)
HOSPITAL MEDICINE OVERNIGHT EVENT NOTE    Notified by nursing that hemoglobin this morning is 6.8, a slight decrease compared to yesterday's hemoglobin of 7.1.  Per my discussion with nursing, patient denies chest pain, shortness of breath or lightheadedness.  Patient has undergone left BKA on 3/9.  Wound VAC is in place with minimal drainage.  No evidence of clinical bleeding otherwise.  Chart reviewed, patient has a history of chronic kidney disease with anemia secondary to chronic kidney disease.  We will order 1 unit packed red blood cell transfusion with posttransfusion H&H.  Vernelle Emerald  MD [facility]

## 2020-06-09 NOTE — Progress Notes (Signed)
Date and time results received: 06/09/20 0449 Test: Hgb Critical Value: 6.8  Name of Provider Notified: Dr. Marlyce Huge  Orders Received? 1 unit PRBCs and type and screen ordered.  Will implement and continue to monitor.

## 2020-06-10 DIAGNOSIS — R0602 Shortness of breath: Secondary | ICD-10-CM

## 2020-06-10 DIAGNOSIS — E875 Hyperkalemia: Secondary | ICD-10-CM

## 2020-06-10 DIAGNOSIS — M86071 Acute hematogenous osteomyelitis, right ankle and foot: Secondary | ICD-10-CM

## 2020-06-10 LAB — TYPE AND SCREEN
ABO/RH(D): O POS
Antibody Screen: NEGATIVE
Unit division: 0

## 2020-06-10 LAB — CBC
HCT: 26.2 % — ABNORMAL LOW (ref 36.0–46.0)
Hemoglobin: 8.4 g/dL — ABNORMAL LOW (ref 12.0–15.0)
MCH: 28.2 pg (ref 26.0–34.0)
MCHC: 32.1 g/dL (ref 30.0–36.0)
MCV: 87.9 fL (ref 80.0–100.0)
Platelets: 285 10*3/uL (ref 150–400)
RBC: 2.98 MIL/uL — ABNORMAL LOW (ref 3.87–5.11)
RDW: 14.6 % (ref 11.5–15.5)
WBC: 9.1 10*3/uL (ref 4.0–10.5)
nRBC: 0 % (ref 0.0–0.2)

## 2020-06-10 LAB — BPAM RBC
Blood Product Expiration Date: 202204082359
ISSUE DATE / TIME: 202203120756
Unit Type and Rh: 5100

## 2020-06-10 LAB — COMPREHENSIVE METABOLIC PANEL
ALT: 26 U/L (ref 0–44)
AST: 26 U/L (ref 15–41)
Albumin: 1.6 g/dL — ABNORMAL LOW (ref 3.5–5.0)
Alkaline Phosphatase: 164 U/L — ABNORMAL HIGH (ref 38–126)
Anion gap: 8 (ref 5–15)
BUN: 29 mg/dL — ABNORMAL HIGH (ref 6–20)
CO2: 25 mmol/L (ref 22–32)
Calcium: 8.3 mg/dL — ABNORMAL LOW (ref 8.9–10.3)
Chloride: 100 mmol/L (ref 98–111)
Creatinine, Ser: 1.33 mg/dL — ABNORMAL HIGH (ref 0.44–1.00)
GFR, Estimated: 55 mL/min — ABNORMAL LOW (ref >60)
Glucose, Bld: 289 mg/dL — ABNORMAL HIGH (ref 70–99)
Potassium: 4.1 mmol/L (ref 3.5–5.1)
Sodium: 133 mmol/L — ABNORMAL LOW (ref 135–145)
Total Bilirubin: 0.3 mg/dL (ref 0.3–1.2)
Total Protein: 6.9 g/dL (ref 6.5–8.1)

## 2020-06-10 MED ORDER — HYDROMORPHONE HCL 1 MG/ML IJ SOLN
1.0000 mg | Freq: Four times a day (QID) | INTRAMUSCULAR | Status: DC | PRN
  Administered 2020-06-10 – 2020-06-14 (×14): 1 mg via INTRAVENOUS
  Filled 2020-06-10 (×15): qty 1

## 2020-06-10 MED ORDER — DEXTROSE 50 % IV SOLN
INTRAVENOUS | Status: AC
  Administered 2020-06-10: 12.5 g via INTRAVENOUS
  Filled 2020-06-10: qty 50

## 2020-06-10 MED ORDER — HYDROMORPHONE HCL 1 MG/ML IJ SOLN
1.0000 mg | INTRAMUSCULAR | Status: AC
Start: 2020-06-10 — End: 2020-06-10
  Administered 2020-06-10: 1 mg via INTRAVENOUS

## 2020-06-10 MED ORDER — DEXTROSE 50 % IV SOLN: 12.5000 g | Freq: Once | INTRAVENOUS | Status: AC

## 2020-06-10 MED ORDER — AMLODIPINE BESYLATE 10 MG PO TABS
10.0000 mg | ORAL_TABLET | Freq: Every day | ORAL | Status: DC
  Filled 2020-06-10: qty 1

## 2020-06-10 MED ORDER — ONDANSETRON HCL 4 MG/2ML IJ SOLN
4.0000 mg | Freq: Four times a day (QID) | INTRAMUSCULAR | Status: DC | PRN
  Administered 2020-06-10 – 2020-06-11 (×3): 4 mg via INTRAVENOUS
  Filled 2020-06-10 (×3): qty 2

## 2020-06-10 MED ORDER — FUROSEMIDE 10 MG/ML IJ SOLN: 40.0000 mg | Freq: Two times a day (BID) | INTRAMUSCULAR | Status: DC

## 2020-06-10 MED ORDER — SODIUM CHLORIDE 0.9 % IV SOLN
8.0000 mg | Freq: Once | INTRAVENOUS | Status: AC
  Administered 2020-06-10: 8 mg via INTRAVENOUS
  Filled 2020-06-10: qty 4

## 2020-06-10 MED ORDER — ACETAMINOPHEN 325 MG PO TABS
650.0000 mg | ORAL_TABLET | Freq: Three times a day (TID) | ORAL | Status: DC
  Administered 2020-06-11 – 2020-06-14 (×7): 650 mg via ORAL
  Filled 2020-06-10 (×8): qty 2

## 2020-06-10 MED ORDER — HYDROMORPHONE HCL 1 MG/ML IJ SOLN
0.5000 mg | INTRAMUSCULAR | Status: DC | PRN
  Administered 2020-06-10 (×2): 0.5 mg via INTRAVENOUS
  Filled 2020-06-10 (×2): qty 0.5

## 2020-06-10 NOTE — Progress Notes (Signed)
PROGRESS NOTE        PATIENT DETAILS Name: [name] Age: 30 y.o. Sex: female Date of Birth: 08/18/1990 Admit Date: 06/04/2020 Admitting Physician Jonnie Finner, DO IOE:VOJJKK, Algis Greenhouse, MD  Brief Narrative: Patient is a 30 y.o. female with a history of DM-1, CKD stage IIIa, chronic diastolic heart failure-underwent right first ray amputation on 1/14 for right foot osteomyelitis-subsequently hospitalized from 2/12-2/24 for severe hypoxia requiring intubation due to aspiration pneumonia and wound dehiscence of amputation site-discharged home on IV antibiotics-presented with right foot osteomyelitis and anasarca. See below for further details  Significant events: 1/14>> right ray amputation by Dr. 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 3/7>> CTA chest: No PE, right pleural effusion, extensive bilateral airspace opacities, anasarca  Antimicrobial therapy: Rocephin: 3/7>>3/11 Zithromax: 3/7>>3/11  Microbiology data: 2/13>> respiratory virus panel: Negative 2/13>> BAL: Cultures negative 2/13>> BAL FEB/fungal culture: Pending 2/14>> blood cultures: Negative 3/7>> blood cultures: No growth 3/7>> urine culture: No growth  Procedures : 1/14>> right ray amputation by Dr. [name] 2/13>> bronchoscopy 2/13-2/15>> intubation/extubation 2/16-2/19>> reintubated/extubated 3/9>> right BKA (Dr [name])  Consults: Orthopedics ID  DVT Prophylaxis : SCDs Start: 06/06/20 1609 enoxaparin  (LOVENOX) injection 40 mg Start: 06/04/20 1200   Subjective:  Patient in bed, appears comfortable, denies any headache, no fever, no chest pain or pressure, no shortness of breath , no abdominal pain. No focal weakness.   Assessment/Plan:  Acute osteomyelitis of right midfoot-history of right first ray amputation on 1/14 with subsequent wound dehiscence: S/p right BKA on 3/9-postop pain much better-orthopedics following-with recommendations to discharge home once stable on Provana pump.  Acute hypoxic respiratory failure due to HFpEF exacerbation +/-residual pneumonia: Hypoxia has resolved-on room air.  Chest x-ray/CT chest-still significant infiltrates but I suspect this is a sequelae of recent pneumonia.  Continue diuresis with IV Lasix.  Has finished a course of IV Rocephin/Zithromax.    Anasarca: Due to a combination of HFpEF, diabetic nephropathy/hypoalbuminemia.  Volume status has markedly improved, continue with IV Lasix ( dose reduced 3/13)- PRN metolazone .  Intake/output is not accurate-her weight over the past several days was also not accurate.    CKD stage IIIb: Creatinine close to baseline-follow closely.  Normocytic anemia: Due to acute illness/CKD-no evidence of blood loss-drop in hemoglobin this morning is likely due to acute illness-being transfused 1 unit of PRBC today-repeat CBC tomorrow morning.  Hyperkalemia: Resolved after Lokelma.  HTN: Stable-continue Coreg  Suspected history of gastroparesis: On Reglan-continues to have vomiting overnight-likely due to narcotics-switch as needed Zofran to Phenergan.  Depression/anxiety: Stable continue BuSpar/Remeron  GERD: Continue PPI  HTN - added Norvasc.  DM-1: Continue with 5 units of Lantus-and SSI.    Lab Results  Component Value Date   HGBA1C 6.9 (H) 06/04/2020   CBG (last 3)  No results for input(s): GLUCAP in the last 72 hours.    Diet: Diet Order            Diet Carb Modified Fluid consistency: Thin;  Room service appropriate? Yes  Diet effective now                  Code Status: Full code   Family Communication: Spouse-[name]-[phone]-left voicemail on 3/10  Disposition Plan: Status is: Inpatient  Remains inpatient appropriate because:Inpatient level of care appropriate due to severity of illness   Dispo: The patient is from: Home              Anticipated d/c is to: Home              Patient currently is not medically stable to d/c.   Difficult to place patient No   Barriers to Discharge: Right foot osteomyelitis-hypoxia due to PNA-on IV antibiotics-transmetatarsal amputation scheduled for 3/9  Antimicrobial agents: Anti-infectives (From admission, onward)   Start     Dose/Rate Route Frequency Ordered Stop   06/06/20 1315  ceFAZolin (ANCEF) IVPB 2g/100 mL premix  Status:  Discontinued        2 g 200 mL/hr over 30 Minutes Intravenous On call to O.R. 06/05/20 1221 06/06/20 1543   06/04/20 1130  cefTRIAXone (ROCEPHIN) 2 g in sodium chloride 0.9 % 100 mL IVPB        2 g 200 mL/hr over 30 Minutes Intravenous Every 24 hours 06/04/20 1122 06/08/20 2359   06/04/20 1130  azithromycin (ZITHROMAX) tablet 500 mg        500 mg Oral Daily 06/04/20 1122 06/08/20 1129       Time spent: 25 minutes-Greater than 50% of this time was spent in counseling, explanation of diagnosis, planning of further management, and coordination of care.  MEDICATIONS: Scheduled Meds: . (feeding supplement) PROSource Plus  30 mL Oral TID BM  . acetaminophen  650 mg Oral Q8H  . amLODipine  10 mg Oral Daily  . busPIRone  5 mg Oral TID  . carvedilol  12.5 mg Oral BID WC  . Chlorhexidine Gluconate Cloth  6 each Topical Daily  . enoxaparin (LOVENOX) injection  40 mg Subcutaneous Daily  . feeding supplement  1 Container Oral TID BM  . [START ON 06/11/2020] furosemide  40 mg Intravenous BID  . insulin aspart  0-6 Units Subcutaneous TID WC  . insulin glargine  5 Units Subcutaneous Daily  . mouth  rinse  15 mL Mouth Rinse BID  . metoCLOPramide  5 mg Oral BID  . mirtazapine  30 mg Oral QHS  . multivitamin with minerals  1 tablet Oral Daily  . pantoprazole  40 mg Oral BID  . polyethylene glycol  17 g Oral Daily   Continuous Infusions: . sodium chloride 10 mL/hr at 06/07/20 0910   PRN Meds:.cyclobenzaprine, HYDROmorphone (DILAUDID) injection, labetalol, LORazepam, oxyCODONE, promethazine, senna-docusate, sodium chloride flush   PHYSICAL EXAM: Vital signs: Vitals:   06/09/20 1653 06/09/20 2105 06/10/20 0508 06/10/20 0511  BP: (!) 171/91 (!) 177/93  (!) 171/91  Pulse: 86 90  96  Resp: _0 Temp: 99 F (37.2 C) 99.2 F (37.3 C)  98.9 F (37.2 C)  TempSrc: Oral Oral  Oral  SpO2: 98% 98%  97%  Weight:   84.6 kg   Height:       Filed Weights   06/08/20 0653 06/09/20 0343 06/10/20 0508  Weight: 89.1 kg 83 kg 84.6 kg   Body mass index is 29.2 kg/m.   Gen Exam:  Awake Alert, No new F.N deficits, Normal affect [place].AT,PERRAL Supple Neck,No JVD, No cervical lymphadenopathy appriciated.  Symmetrical Chest wall movement, Good air movement bilaterally, CTAB  RRR,No Gallops, Rubs or new Murmurs, No Parasternal Heave +ve B.Sounds, Abd Soft, No tenderness, No organomegaly appriciated, No rebound - guarding or rigidity. No Cyanosis, +ve edema, R BKA stump under bandage and splint   I have personally reviewed following labs and imaging studies  LABORATORY DATA:  Recent Labs  Lab 06/04/20 0420 06/05/20 0350 06/06/20 0258 06/07/20 0345 06/08/20 0320 06/09/20 0415 06/09/20 1302 06/10/20 0328  WBC 8.9   < > 6.5 7.8 8.0 8.0  --  9.1  HGB 7.9*   < > 8.5* 7.5* 7.1* 6.8* 8.1* 8.4*  HCT 26.6*   < > 26.4* 23.1* 23.2* 21.0* 24.5* 26.2*  PLT 236   < > 202 232 216 231  --  285  MCV 96.7   < > 90.1 89.5 91.7 87.1  --  87.9  MCH 28.7   < > 29.0 29.1 28.1 28.2  --  28.2  MCHC 29.7*   < > 32.2 32.5 30.6 32.4  --  32.1  RDW 15.9*   < > 15.6* 15.3 15.4 15.2  --  14.6   LYMPHSABS 0.8  --   --   --   --   --   --   --   MONOABS 1.3*  --   --   --   --   --   --   --   EOSABS 0.5  --   --   --   --   --   --   --   BASOSABS 0.0  --   --   --   --   --   --   --    < > = values in this interval not displayed.    Recent Labs  Lab 06/04/20 0420 06/04/20 0522 06/04/20 2205 06/05/20 0350 06/06/20 0258 06/07/20 0345 06/08/20 0320 06/09/20 0415 06/10/20 0328  NA  --    < > 131*   < > 130* 135 134* 133* 133*  K  --    < > 5.8*   < > 4.5 4.5 4.2 4.3 4.1  CL  --    < > 103   < > 102 103 104 101 100  CO2  --    < > 20*   < > 21* _0 GLUCOSE  --    < > 285*   < > 350* 218* 242* 333* 289*  BUN  --    < > 27*   < > 29* 24* 23* 27* 29*  CREATININE  --    < > 1.40*   < > 1.48* 1.37* 1.26* 1.12* 1.33*  CALCIUM  --    < > 8.3*   < > 7.8* 8.0* 7.9* 8.1* 8.3*  AST  --    < >  --    < > _1 32 26  ALT  --    < >  --    < > 38 _2 ALKPHOS  --    < >  --    < > 215* 181* 168* 159* 164*  BILITOT  --    < >  --    < > 0.4 0.5 0.4 0.3 0.3  ALBUMIN  --    < > 1.7*   < > 1.6* 1.5* 1.6* 1.5* 1.6*  MG  --   --   --   --   --   --  1.5* 2.2  --  PROCALCITON  --   --   --   --  0.19  --   --   --   --   LATICACIDVEN 0.6  --   --   --   --   --   --   --   --   INR 1.2  --   --   --   --   --   --   --   --   HGBA1C  --   --  6.9*  --   --   --   --   --   --   BNP 444.7*  --   --   --   --   --   --   --   --    < > = values in this interval not displayed.      Cardiac Enzymes: No results for input(s): CKTOTAL, CKMB, CKMBINDEX, TROPONINI in the last 168 hours.  BNP (last 3 results) No results for input(s): PROBNP in the last 8760 hours.  Lipid Profile: No results for input(s): CHOL, HDL, LDLCALC, TRIG, CHOLHDL, LDLDIRECT in the last 72 hours.  Thyroid Function Tests: No results for input(s): TSH, T4TOTAL, FREET4, T3FREE, THYROIDAB in the last 72 hours.  Anemia Panel: No results for input(s): VITAMINB12, FOLATE, FERRITIN, TIBC, IRON,  RETICCTPCT in the last 72 hours.  Urine analysis:    Component Value Date/Time   COLORURINE YELLOW 06/04/2020 0813   APPEARANCEUR CLEAR 06/04/2020 0813   LABSPEC 1.012 06/04/2020 0813   PHURINE 5.0 06/04/2020 0813   GLUCOSEU NEGATIVE 06/04/2020 0813   HGBUR SMALL (A) 06/04/2020 0813   BILIRUBINUR NEGATIVE 06/04/2020 0813   BILIRUBINUR negative 01/02/2015 1717   KETONESUR NEGATIVE 06/04/2020 0813   PROTEINUR 100 (A) 06/04/2020 0813   UROBILINOGEN 0.2 01/13/2015 0223   NITRITE NEGATIVE 06/04/2020 0813   LEUKOCYTESUR NEGATIVE 06/04/2020 0813    Sepsis Labs: Lactic Acid, Venous    Component Value Date/Time   LATICACIDVEN 0.6 06/04/2020 0420    MICROBIOLOGY: Recent Results (from the past 240 hour(s))  Blood culture (routine x 2)     Status: None   Collection Time: 06/04/20  3:47 AM   Specimen: BLOOD  Result Value Ref Range Status   Specimen Description   Final    BLOOD BLOOD LEFT FOREARM Performed at [facility], [place] [address], [place], [place] [address]    Special Requests   Final    BOTTLES DRAWN AEROBIC AND ANAEROBIC Blood Culture adequate volume Performed at [facility] [address], [place], [place] [address]    Culture   Final    NO GROWTH 5 DAYS Performed at [facility], [place] [address], [place], [place] [address]    Report Status 06/09/2020 FINAL  Final  Blood culture (routine x 2)     Status: None   Collection Time: 06/04/20  5:07 AM   Specimen: BLOOD  Result Value Ref Range Status   Specimen Description   Final    BLOOD BLOOD RIGHT HAND Performed at [facility] [address] [place], [place] [address]    Special Requests   Final    BOTTLES DRAWN AEROBIC AND ANAEROBIC Blood Culture adequate volume Performed at [facility] [address] [place], [place] [address]    Culture   Final    NO GROWTH 5 DAYS Performed at [facility], [place] [address],  [place], [place] [address]  Report Status 06/09/2020 FINAL  Final  Resp Panel by RT-PCR (Flu A&B, Covid) Nasopharyngeal Swab     Status: None   Collection Time: 06/04/20  7:18 AM   Specimen: Nasopharyngeal Swab; Nasopharyngeal(NP) swabs in vial transport medium  Result Value Ref Range Status   SARS Coronavirus 2 by RT PCR NEGATIVE NEGATIVE Final    Comment: (NOTE) SARS-CoV-2 target nucleic acids are NOT DETECTED.  The SARS-CoV-2 RNA is generally detectable in upper respiratory specimens during the acute phase of infection. The lowest concentration of SARS-CoV-2 viral copies this assay can detect is 138 copies/mL. A negative result does not preclude SARS-Cov-2 infection and should not be used as the sole basis for treatment or other patient management decisions. A negative result may occur with  improper specimen collection/handling, submission of specimen other than nasopharyngeal swab, presence of viral mutation(s) within the areas targeted by this assay, and inadequate number of viral copies(<138 copies/mL). A negative result must be combined with clinical observations, patient history, and epidemiological information. The expected result is Negative.  Fact Sheet for Patients:  EntrepreneurPulse.com.au  Fact Sheet for Healthcare Providers:  IncredibleEmployment.be  This test is no t yet approved or cleared by the [place] FDA and  has been authorized for detection and/or diagnosis of SARS-CoV-2 by FDA under an Emergency Use Authorization (EUA). This EUA will remain  in effect (meaning this test can be used) for the duration of the COVID-19 declaration under Section 564(b)(1) of the Act, 21 U.S.C.section 360bbb-3(b)(1), unless the authorization is terminated  or revoked sooner.       Influenza A by PCR NEGATIVE NEGATIVE Final   Influenza B by PCR NEGATIVE NEGATIVE Final    Comment: (NOTE) The Xpert Xpress SARS-CoV-2/FLU/RSV plus assay is  intended as an aid in the diagnosis of influenza from Nasopharyngeal swab specimens and should not be used as a sole basis for treatment. Nasal washings and aspirates are unacceptable for Xpert Xpress SARS-CoV-2/FLU/RSV testing.  Fact Sheet for Patients: EntrepreneurPulse.com.au  Fact Sheet for Healthcare Providers: IncredibleEmployment.be  This test is not yet approved or cleared by the [place] FDA and has been authorized for detection and/or diagnosis of SARS-CoV-2 by FDA under an Emergency Use Authorization (EUA). This EUA will remain in effect (meaning this test can be used) for the duration of the COVID-19 declaration under Section 564(b)(1) of the Act, 21 U.S.C. section 360bbb-3(b)(1), unless the authorization is terminated or revoked.  Performed at [facility], [place] [address] [place], [place] [address]   Urine culture     Status: None   Collection Time: 06/04/20  8:13 AM   Specimen: In/Out Cath Urine  Result Value Ref Range Status   Specimen Description   Final    IN/OUT CATH URINE Performed at [facility] [address], [place], [place] [address]    Special Requests   Final    NONE Performed at [facility], [place] [address] [place], [place] [address]    Culture   Final    NO GROWTH Performed at [facility], [place] [address], [place], [place] [address]    Report Status 06/05/2020 FINAL  Final  MRSA PCR Screening     Status: None   Collection Time: 06/05/20  3:59 PM   Specimen: Nasal Mucosa; Nasopharyngeal  Result Value Ref Range Status   MRSA by PCR NEGATIVE NEGATIVE Final    Comment:        The GeneXpert MRSA Assay (FDA  approved for NASAL specimens only), is one component of a comprehensive MRSA colonization surveillance program. It is not intended to diagnose MRSA infection nor to guide or monitor treatment for MRSA infections. Performed at [facility], [place] [address] [place], [place] [address]     RADIOLOGY STUDIES/RESULTS: DG Chest Port 1V same Day  Result Date: 06/09/2020 CLINICAL DATA:  Shortness of breath EXAM: PORTABLE CHEST 1 VIEW COMPARISON:  June 04, 2020 chest radiograph and chest CT. FINDINGS: There is patchy airspace opacity in each mid lung region. There has been clearing of ill-defined airspace opacity from the bases compared to most recent chest radiograph. Previously noted pleural effusions seen on CT are not appreciable by radiography. There is mild cardiomegaly with pulmonary vascularity normal. No adenopathy. Central catheter tip is in the superior cava. No pneumothorax. No bone lesions. IMPRESSION: Patchy airspace opacity, likely representing foci of pneumonia, in each mid lung. Interval clearing of ill-defined opacity from the lung bases. No new opacity evident. Stable cardiac prominence. No pleural effusions evident by radiography. Central catheter tip in superior vena cava. Electronically Signed   By: Lowella Grip III M.D.   On: 06/09/2020 08:45     LOS: 6 days   Lala Lund, MD  [facility]   06/10/2020, 9:13 AM

## 2020-06-10 NOTE — Progress Notes (Signed)
Hypoglycemic Event  CBG: 66  Treatment: Pt refusing PO intake due to severe nausea/vomiting all day. Per hypoglycemic standing orders, gave 12.5g D50 IV.   Symptoms: None  Follow-up CBG: Time:1810 CBG Result:123  Possible Reasons for Event: no oral intake throughout the day due to severe N/V.  Comments/MD notified: Dr.Singh notified per protocol.     Kalief Kattner Luz Lex

## 2020-06-10 NOTE — Progress Notes (Signed)
Patient has been having severe pain throughout the day despite around the clock PRN IV pain medicine. She is also experiencing nausea and vomiting, prohibiting her from keeping down any PO medicine including PO pain medicine. A PRN [place] of [place]an [place] prior ordered PRN phenergan, with no relief. A one-time 89mdose of Zofran was ordered and will be given once it is sent from pharmacy. Patient is crying due to the severe pain.  New orders for dilaudid have been placed by Dr.Singh and will be given.

## 2020-06-11 ENCOUNTER — Inpatient Hospital Stay (HOSPITAL_COMMUNITY): Payer: 59

## 2020-06-11 DIAGNOSIS — R112 Nausea with vomiting, unspecified: Secondary | ICD-10-CM

## 2020-06-11 LAB — MAGNESIUM: Magnesium: 1.7 mg/dL (ref 1.7–2.4)

## 2020-06-11 LAB — CBC WITH DIFFERENTIAL/PLATELET
Abs Immature Granulocytes: 0.03 10*3/uL (ref 0.00–0.07)
Basophils Absolute: 0.1 10*3/uL (ref 0.0–0.1)
Basophils Relative: 1 %
Eosinophils Absolute: 0 10*3/uL (ref 0.0–0.5)
Eosinophils Relative: 0 %
HCT: 28.4 % — ABNORMAL LOW (ref 36.0–46.0)
Hemoglobin: 9.4 g/dL — ABNORMAL LOW (ref 12.0–15.0)
Immature Granulocytes: 0 %
Lymphocytes Relative: 8 %
Lymphs Abs: 0.8 10*3/uL (ref 0.7–4.0)
MCH: 28.3 pg (ref 26.0–34.0)
MCHC: 33.1 g/dL (ref 30.0–36.0)
MCV: 85.5 fL (ref 80.0–100.0)
Monocytes Absolute: 0.1 10*3/uL (ref 0.1–1.0)
Monocytes Relative: 1 %
Neutro Abs: 8.9 10*3/uL — ABNORMAL HIGH (ref 1.7–7.7)
Neutrophils Relative %: 90 %
Platelets: 325 10*3/uL (ref 150–400)
RBC: 3.32 MIL/uL — ABNORMAL LOW (ref 3.87–5.11)
RDW: 14.4 % (ref 11.5–15.5)
WBC: 9.9 10*3/uL (ref 4.0–10.5)
nRBC: 0 % (ref 0.0–0.2)

## 2020-06-11 LAB — BASIC METABOLIC PANEL
Anion gap: 13 (ref 5–15)
BUN: 30 mg/dL — ABNORMAL HIGH (ref 6–20)
CO2: 25 mmol/L (ref 22–32)
Calcium: 8.7 mg/dL — ABNORMAL LOW (ref 8.9–10.3)
Chloride: 99 mmol/L (ref 98–111)
Creatinine, Ser: 1.2 mg/dL — ABNORMAL HIGH (ref 0.44–1.00)
GFR, Estimated: >60 mL/min (ref >60)
Glucose, Bld: 188 mg/dL — ABNORMAL HIGH (ref 70–99)
Potassium: 3.6 mmol/L (ref 3.5–5.1)
Sodium: 137 mmol/L (ref 135–145)

## 2020-06-11 IMAGING — DX DG ABDOMEN 1V
1 series · 1 of 1 positions shown · non-contrast
Comparison: [DATE]

CLINICAL DATA: Generalized abdominal pain

EXAM:
ABDOMEN - 1 VIEW

[t abdomen supine]
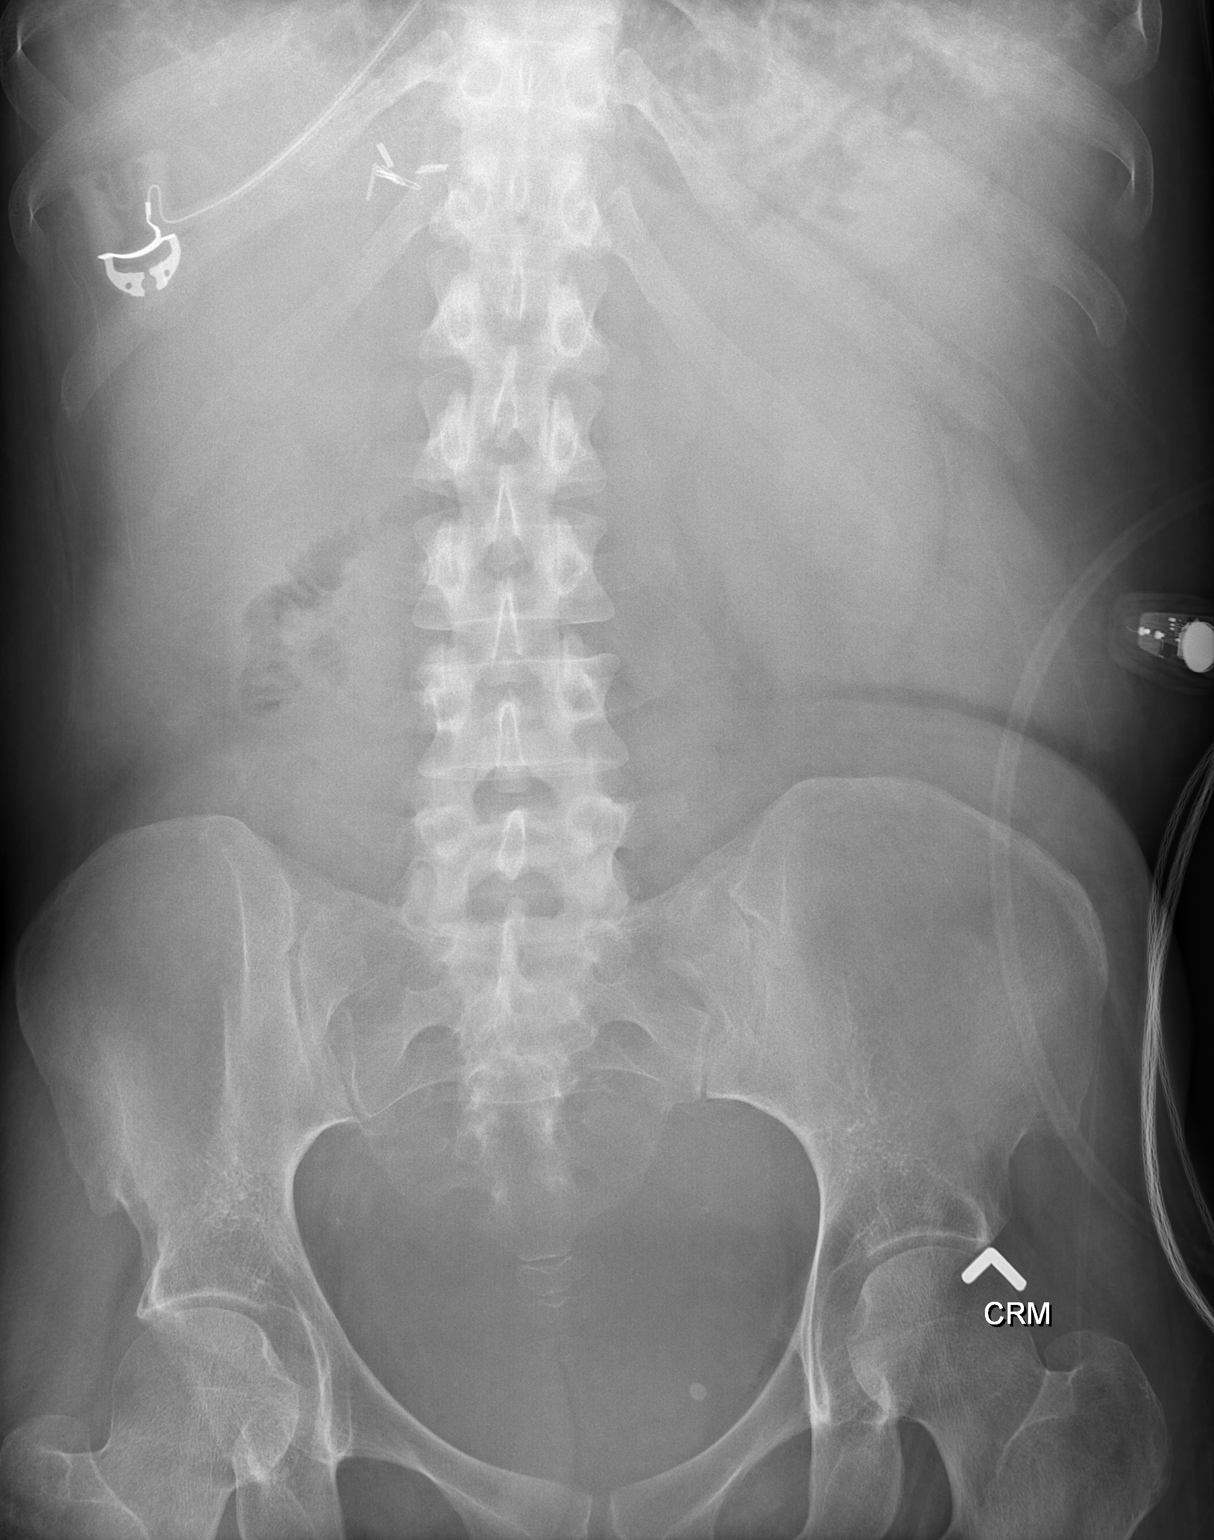

[1 of 1 positions shown; findings below may reference images not displayed]

FINDINGS: No dilated large or small bowel. Small volume gas within the small
bowel of the RIGHT abdomen. No gas in the rectum. Gas noted in the
stomach. No pathologic calcifications. No organomegaly.
IMPRESSION: 1. Paucity of bowel gas.
2. No evidence of high-grade obstruction.

## 2020-06-11 MED ORDER — INSULIN GLARGINE 100 UNIT/ML ~~LOC~~ SOLN
8.0000 [IU] | Freq: Every day | SUBCUTANEOUS | Status: DC
  Administered 2020-06-11 – 2020-06-14 (×3): 8 [IU] via SUBCUTANEOUS
  Filled 2020-06-11 (×4): qty 0.08

## 2020-06-11 MED ORDER — HALOPERIDOL LACTATE 5 MG/ML IJ SOLN
2.0000 mg | Freq: Four times a day (QID) | INTRAMUSCULAR | Status: DC | PRN
  Administered 2020-06-11: 2 mg via INTRAVENOUS
  Filled 2020-06-11: qty 1

## 2020-06-11 MED ORDER — PROMETHAZINE HCL 25 MG/ML IJ SOLN
12.5000 mg | Freq: Four times a day (QID) | INTRAMUSCULAR | Status: DC
  Administered 2020-06-11: 12.5 mg via INTRAVENOUS
  Filled 2020-06-11: qty 1

## 2020-06-11 MED ORDER — CARVEDILOL 12.5 MG PO TABS
12.5000 mg | ORAL_TABLET | Freq: Two times a day (BID) | ORAL | Status: DC
Start: 2020-06-11 — End: 2020-06-14
  Administered 2020-06-11 – 2020-06-14 (×7): 12.5 mg via ORAL
  Filled 2020-06-11 (×8): qty 1

## 2020-06-11 MED ORDER — SODIUM CHLORIDE 0.9 % IV SOLN
12.5000 mg | Freq: Four times a day (QID) | INTRAVENOUS | Status: AC
  Administered 2020-06-11 – 2020-06-13 (×7): 12.5 mg via INTRAVENOUS
  Filled 2020-06-11 (×8): qty 0.5

## 2020-06-11 MED ORDER — FUROSEMIDE 40 MG PO TABS
40.0000 mg | ORAL_TABLET | Freq: Two times a day (BID) | ORAL | Status: DC
  Administered 2020-06-11: 40 mg via ORAL
  Filled 2020-06-11 (×2): qty 1

## 2020-06-11 MED ORDER — AMLODIPINE BESYLATE 10 MG PO TABS
10.0000 mg | ORAL_TABLET | Freq: Every day | ORAL | Status: DC
  Administered 2020-06-11 – 2020-06-14 (×4): 10 mg via ORAL
  Filled 2020-06-11 (×3): qty 1

## 2020-06-11 MED ORDER — CLONIDINE HCL 0.3 MG/24HR TD PTWK
0.3000 mg | MEDICATED_PATCH | TRANSDERMAL | Status: DC
  Administered 2020-06-11: 0.3 mg via TRANSDERMAL
  Filled 2020-06-11 (×2): qty 1

## 2020-06-11 NOTE — Progress Notes (Signed)
PROGRESS NOTE        PATIENT DETAILS Name: [name] Age: 30 y.o. Sex: female Date of Birth: 15-Mar-1991 Admit Date: 06/04/2020 Admitting Physician Jonnie Finner, DO GGY:IRSWNI, Algis Greenhouse, MD  Brief Narrative: Patient is a 30 y.o. female with a history of DM-1, CKD stage IIIa, chronic diastolic heart failure-underwent right first ray amputation on 1/14 for right foot osteomyelitis-subsequently hospitalized from 2/12-2/24 for severe hypoxia requiring intubation due to aspiration pneumonia and wound dehiscence of amputation site-discharged home on IV antibiotics-presented with right foot osteomyelitis and anasarca. See below for further details  Significant events: 1/14>> right ray amputation by Dr. 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 3/7>> CTA chest: No PE, right pleural effusion, extensive bilateral airspace opacities, anasarca  Antimicrobial therapy: Rocephin: 3/7>>3/11 Zithromax: 3/7>>3/11  Microbiology data: 2/13>> respiratory virus panel: Negative 2/13>> BAL: Cultures negative 2/13>> BAL FEB/fungal culture: Pending 2/14>> blood cultures: Negative 3/7>> blood cultures: No growth 3/7>> urine culture: No growth  Procedures : 1/14>> right ray amputation by Dr. [name] 2/13>> bronchoscopy 2/13-2/15>> intubation/extubation 2/16-2/19>> reintubated/extubated 3/9>> right BKA (Dr [name])  Consults: Orthopedics ID  DVT Prophylaxis : SCDs Start: 06/06/20 1609 enoxaparin  (LOVENOX) injection 40 mg Start: 06/04/20 1200   Subjective:  Patient in bed does not appear to be in any discomfort but states she is extremely nauseated wanting something for nausea, having some BKA stump site pain, no abdominal pain or chest pain.   Assessment/Plan:  Acute osteomyelitis of right midfoot-history of right first ray amputation on 1/14 with subsequent wound dehiscence: S/p right BKA on 3/9-postop pain much better-orthopedics following-with recommendations to discharge home once stable on Provana pump.  Patient is exhibiting some narcotic seeking behavior, requested staff to push Dilaudid fasting to IV and then repeatedly falls asleep.  Will monitor closely.  Acute hypoxic respiratory failure due to HFpEF exacerbation +/-residual pneumonia: Hypoxia has resolved-on room air.  Chest x-ray/CT chest-still significant infiltrates but I suspect this is a sequelae of recent pneumonia.  Continue diuresis with IV Lasix.  Has finished a course of IV Rocephin/Zithromax.    Anasarca: Due to a combination of HFpEF, diabetic nephropathy/hypoalbuminemia.  Volume status has markedly improved, continue with IV Lasix ( dose reduced 3/13)- PRN metolazone .  Intake/output is not accurate-her weight over the past several days was also not accurate.    CKD stage IIIb: Creatinine close to baseline-follow closely.  Normocytic anemia: Due to acute illness/CKD-no evidence of blood loss-drop in hemoglobin this morning is likely due to acute illness-being transfused 1 unit of PRBC today-repeat CBC tomorrow morning.  Hyperkalemia: Resolved after Lokelma.  Suspected history of gastroparesis: On Reglan besides multiple antiemetics continues to have nausea vomiting, have consulted GI on 06/12/2018.  Depression/anxiety: Stable continue BuSpar/Remeron  GERD: Continue PPI  HTN - due to persistent nausea and oral intake patient as needed IV hydralazine continued, added Catapres patch.  Monitor.  DM-1:  Continue with 5 units of Lantus-and SSI.    Lab Results  Component Value Date   HGBA1C 6.9 (H)  06/04/2020   CBG (last 3)  No results for input(s): GLUCAP in the last 72 hours.    Diet: Diet Order            Diet Carb Modified Fluid consistency: Thin; Room service appropriate? Yes  Diet effective now                  Code Status: Full code   Family Communication: Spouse-[name]-left voicemail on 3/10  Disposition Plan: Status is: Inpatient  Remains inpatient appropriate because:Inpatient level of care appropriate due to severity of illness   Dispo: The patient is from: Home              Anticipated d/c is to: Home              Patient currently is not medically stable to d/c.   Difficult to place patient No   Barriers to Discharge: Right foot osteomyelitis-hypoxia due to PNA-on IV antibiotics-transmetatarsal amputation scheduled for 3/9  Antimicrobial agents: Anti-infectives (From admission, onward)   Start     Dose/Rate Route Frequency Ordered Stop   06/06/20 1315  ceFAZolin (ANCEF) IVPB 2g/100 mL premix  Status:  Discontinued        2 g 200 mL/hr over 30 Minutes Intravenous On call to O.R. 06/05/20 1221 06/06/20 1543   06/04/20 1130  cefTRIAXone (ROCEPHIN) 2 g in sodium chloride 0.9 % 100 mL IVPB        2 g 200 mL/hr over 30 Minutes Intravenous Every 24 hours 06/04/20 1122 06/08/20 2359   06/04/20 1130  azithromycin (ZITHROMAX) tablet 500 mg        500 mg Oral Daily 06/04/20 1122 06/08/20 1129       Time spent: 25 minutes-Greater than 50% of this time was spent in counseling, explanation of diagnosis, planning of further management, and coordination of care.  MEDICATIONS: Scheduled Meds: . (feeding supplement) PROSource Plus  30 mL Oral TID BM  . acetaminophen  650 mg Oral Q8H  . amLODipine  10 mg Oral Daily  . busPIRone  5 mg Oral TID  . carvedilol  12.5 mg Oral BID WC  . Chlorhexidine Gluconate Cloth  6 each Topical Daily  . enoxaparin  (LOVENOX) injection  40 mg Subcutaneous Daily  . feeding supplement  1 Container Oral TID BM  . furosemide  40 mg Oral BID  . insulin aspart  0-6 Units Subcutaneous TID WC  . insulin glargine  8 Units Subcutaneous Daily  . mouth rinse  15 mL Mouth Rinse BID  . metoCLOPramide  5 mg Oral BID  . mirtazapine  30 mg Oral QHS  . multivitamin with minerals  1 tablet Oral Daily  . pantoprazole  40 mg Oral BID  . polyethylene glycol  17 g Oral Daily   Continuous Infusions: . sodium chloride 10 mL/hr at 06/07/20 0910   PRN Meds:.cyclobenzaprine, haloperidol lactate, HYDROmorphone (DILAUDID) injection, labetalol, LORazepam, ondansetron (ZOFRAN) IV, oxyCODONE, promethazine, senna-docusate, sodium chloride flush   PHYSICAL EXAM: Vital signs: Vitals:   06/10/20 1613 06/10/20 2015 06/11/20 0500 06/11/20 1156  BP: (!) 191/108 (!) 157/82 (!) 184/95 (!) 194/100  Pulse:  90 100 98  Resp: _0 Temp:  98.6 F (37 C) 98.2 F (36.8 C) 97.7 F (36.5 C)  TempSrc:  Oral Oral Oral  SpO2:  95%  98%  Weight:   85 kg   Height:       Filed Weights   06/09/20 0343 06/10/20 0508 06/11/20  0500  Weight: 83 kg 84.6 kg 85 kg   Body mass index is 29.34 kg/m.   Gen Exam:  Awake Alert, No new F.N deficits, Normal affect .AT,PERRAL Supple Neck,No JVD, No cervical lymphadenopathy appriciated.  Symmetrical Chest wall movement, Good air movement bilaterally, CTAB RRR,No Gallops, Rubs or new Murmurs, No Parasternal Heave +ve B.Sounds, Abd Soft, No tenderness, No organomegaly appriciated, No rebound - guarding or rigidity. No Cyanosis,  R BKA stump under bandage and splint   I have personally reviewed following labs and imaging studies  LABORATORY DATA:  Recent Labs  Lab 06/07/20 0345 06/08/20 0320 06/09/20 0415 06/09/20 1302 06/10/20 0328 06/11/20 0400  WBC 7.8 8.0 8.0  --  9.1 9.9  HGB 7.5* 7.1* 6.8* 8.1* 8.4* 9.4*  HCT 23.1* 23.2* 21.0* 24.5* 26.2* 28.4*  PLT 232 216 231  --  285  325  MCV 89.5 91.7 87.1  --  87.9 85.5  MCH 29.1 28.1 28.2  --  28.2 28.3  MCHC 32.5 30.6 32.4  --  32.1 33.1  RDW 15.3 15.4 15.2  --  14.6 14.4  LYMPHSABS  --   --   --   --   --  0.8  MONOABS  --   --   --   --   --  0.1  EOSABS  --   --   --   --   --  0.0  BASOSABS  --   --   --   --   --  0.1    Recent Labs  Lab 06/04/20 2205 06/05/20 0350 06/06/20 0258 06/07/20 0345 06/08/20 0320 06/09/20 0415 06/10/20 0328 06/11/20 0400  NA 131*   < > 130* 135 134* 133* 133* 137  K 5.8*   < > 4.5 4.5 4.2 4.3 4.1 3.6  CL 103   < > 102 103 104 101 100 99  CO2 20*   < > 21* _0 GLUCOSE 285*   < > 350* 218* 242* 333* 289* 188*  BUN 27*   < > 29* 24* 23* 27* 29* 30*  CREATININE 1.40*   < > 1.48* 1.37* 1.26* 1.12* 1.33* 1.20*  CALCIUM 8.3*   < > 7.8* 8.0* 7.9* 8.1* 8.3* 8.7*  AST  --    < > _1 32 26  --   ALT  --    < > 38 _2 --   ALKPHOS  --    < > 215* 181* 168* 159* 164*  --   BILITOT  --    < > 0.4 0.5 0.4 0.3 0.3  --   ALBUMIN 1.7*   < > 1.6* 1.5* 1.6* 1.5* 1.6*  --   MG  --   --   --   --  1.5* 2.2  --  1.7  PROCALCITON  --   --  0.19  --   --   --   --   --   HGBA1C 6.9*  --   --   --   --   --   --   --    < > = values in this interval not displayed.      Cardiac Enzymes: No results for input(s): CKTOTAL, CKMB, CKMBINDEX, TROPONINI in the last 168 hours.  BNP (last 3 results) No results for input(s): PROBNP in the last 8760 hours.  Lipid Profile: No results for input(s): CHOL, HDL, LDLCALC, TRIG, CHOLHDL, LDLDIRECT in  the last 72 hours.  Thyroid Function Tests: No results for input(s): TSH, T4TOTAL, FREET4, T3FREE, THYROIDAB in the last 72 hours.  Anemia Panel: No results for input(s): VITAMINB12, FOLATE, FERRITIN, TIBC, IRON, RETICCTPCT in the last 72 hours.  Urine analysis:    Component Value Date/Time   COLORURINE YELLOW 06/04/2020 0813   APPEARANCEUR CLEAR 06/04/2020 0813   LABSPEC 1.012 06/04/2020 0813   PHURINE 5.0 06/04/2020  0813   GLUCOSEU NEGATIVE 06/04/2020 0813   HGBUR SMALL (A) 06/04/2020 0813   BILIRUBINUR NEGATIVE 06/04/2020 0813   BILIRUBINUR negative 01/02/2015 1717   KETONESUR NEGATIVE 06/04/2020 0813   PROTEINUR 100 (A) 06/04/2020 0813   UROBILINOGEN 0.2 01/13/2015 0223   NITRITE NEGATIVE 06/04/2020 0813   LEUKOCYTESUR NEGATIVE 06/04/2020 0813    Sepsis Labs: Lactic Acid, Venous    Component Value Date/Time   LATICACIDVEN 0.6 06/04/2020 0420    MICROBIOLOGY: Recent Results (from the past 240 hour(s))  Blood culture (routine x 2)     Status: None   Collection Time: 06/04/20  3:47 AM   Specimen: BLOOD  Result Value Ref Range Status   Specimen Description   Final    BLOOD BLOOD LEFT FOREARM Performed at [facility], [place] [address] [place], [place] [address]    Special Requests   Final    BOTTLES DRAWN AEROBIC AND ANAEROBIC Blood Culture adequate volume Performed at [place] [address] [place], [place] [address]    Culture   Final    NO GROWTH 5 DAYS Performed at [facility], [place] [address] [place], [place] [address]    Report Status 06/09/2020 FINAL  Final  Blood culture (routine x 2)     Status: None   Collection Time: 06/04/20  5:07 AM   Specimen: BLOOD  Result Value Ref Range Status   Specimen Description   Final    BLOOD BLOOD RIGHT HAND Performed at [place] [address] [place], [place] [address]    Special Requests   Final    BOTTLES DRAWN AEROBIC AND ANAEROBIC Blood Culture adequate volume Performed at [place] [address], [place], [place] [address]    Culture   Final    NO GROWTH 5 DAYS Performed at [facility], [place] [address], [place], [place] [address]    Report Status 06/09/2020 FINAL  Final  Resp Panel by RT-PCR (Flu A&B, Covid) Nasopharyngeal Swab     Status: None   Collection Time: 06/04/20  7:18 AM   Specimen: Nasopharyngeal Swab;  Nasopharyngeal(NP) swabs in vial transport medium  Result Value Ref Range Status   SARS Coronavirus 2 by RT PCR NEGATIVE NEGATIVE Final    Comment: (NOTE) SARS-CoV-2 target nucleic acids are NOT DETECTED.  The SARS-CoV-2 RNA is generally detectable in upper respiratory specimens during the acute phase of infection. The lowest concentration of SARS-CoV-2 viral copies this assay can detect is 138 copies/mL. A negative result does not preclude SARS-Cov-2 infection and should not be used as the sole basis for treatment or other patient management decisions. A negative result may occur with  improper specimen collection/handling, submission of specimen other than nasopharyngeal swab, presence of viral mutation(s) within the areas targeted by this assay, and inadequate number of viral copies(<138 copies/mL). A negative result must be combined with clinical observations, patient history, and epidemiological information. The expected result is Negative.  Fact Sheet for Patients:  EntrepreneurPulse.com.au  Fact Sheet for Healthcare Providers:  IncredibleEmployment.be  This test is no t yet approved or cleared by the [place] and  has been authorized for detection and/or diagnosis of SARS-CoV-2 by FDA under an Emergency Use Authorization (EUA). This EUA will remain  in effect (meaning this test can be used) for the duration of the COVID-19 declaration under Section 564(b)(1) of the Act, 21 U.S.C.section 360bbb-3(b)(1), unless the authorization is terminated  or revoked sooner.       Influenza A by PCR NEGATIVE NEGATIVE Final   Influenza B by PCR NEGATIVE NEGATIVE Final    Comment: (NOTE) The Xpert Xpress SARS-CoV-2/FLU/RSV plus assay is intended as an aid in the diagnosis of influenza from Nasopharyngeal swab specimens and should not be used as a sole basis for treatment. Nasal washings and aspirates are unacceptable for Xpert Xpress  SARS-CoV-2/FLU/RSV testing.  Fact Sheet for Patients: EntrepreneurPulse.com.au  Fact Sheet for Healthcare Providers: IncredibleEmployment.be  This test is not yet approved or cleared by the [place] FDA and has been authorized for detection and/or diagnosis of SARS-CoV-2 by FDA under an Emergency Use Authorization (EUA). This EUA will remain in effect (meaning this test can be used) for the duration of the COVID-19 declaration under Section 564(b)(1) of the Act, 21 U.S.C. section 360bbb-3(b)(1), unless the authorization is terminated or revoked.  Performed at [facility], [place] [address], [place], [place] [address]   Urine culture     Status: None   Collection Time: 06/04/20  8:13 AM   Specimen: In/Out Cath Urine  Result Value Ref Range Status   Specimen Description   Final    IN/OUT CATH URINE Performed at [place] [address] [place], [place] [address]    Special Requests   Final    NONE Performed at [facility], [place] [address] [place], [place] [address]    Culture   Final    NO GROWTH Performed at [facility], [place] [address] [place], [place] [address]    Report Status 06/05/2020 FINAL  Final  MRSA PCR Screening     Status: None   Collection Time: 06/05/20  3:59 PM   Specimen: Nasal Mucosa; Nasopharyngeal  Result Value Ref Range Status   MRSA by PCR NEGATIVE NEGATIVE Final    Comment:        The GeneXpert MRSA Assay (FDA approved for NASAL specimens only), is one component of a comprehensive MRSA colonization surveillance program. It is not intended to diagnose MRSA infection nor to guide or monitor treatment for MRSA infections. Performed at [facility], [place] [address] [place], [place] [address]     RADIOLOGY STUDIES/RESULTS: DG Abd 1 View  Result Date: 06/11/2020 CLINICAL DATA:  Generalized abdominal pain EXAM: ABDOMEN - 1 VIEW  COMPARISON:  05/16/2020 FINDINGS: No dilated large or small bowel. Small volume gas within the small bowel of the RIGHT abdomen. No gas in the rectum. Gas noted in the stomach. No pathologic calcifications. No organomegaly. IMPRESSION: 1. Paucity of bowel gas. 2. No evidence of high-grade obstruction. Electronically Signed   By: Suzy Bouchard M.D.   On: 06/11/2020 10:29     LOS: 7 days   Lala Lund, MD  [facility]   06/11/2020, 11:58 AM

## 2020-06-11 NOTE — Progress Notes (Addendum)
Nausea and vomiting continuing intermittently w/ pain RLE 10/10. She refuses PO medications due to nausea. IV meds being given to control her nausea , pain, and BP per EMAR orders.

## 2020-06-11 NOTE — Progress Notes (Signed)
Pt continues to refuse all oral medications despite RN and MD education regarding risks of not taking these medications. Pt states she's too nauseous but is being given multiple kinds of antiemetic medication around the clock.

## 2020-06-11 NOTE — Progress Notes (Signed)
Pt begging this RN to push iv pain medication faster. Explained the risks of doing this and instructed pt that this is against recommendations for that medication. Pt continued begging until she fell asleep.

## 2020-06-11 NOTE — Progress Notes (Signed)
Occupational Therapy Treatment Patient Details Name: [name] MRN: [id number] DOB: [birth year] Today's Date: 06/11/2020    History of present illness [name] is a 30 y.o. female who presents with nonhealing right foot first ray amputation.  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  Patient has undergone wound care and has improved granulation tissue around the chronic draining sinus tract but still has a deep infection. Pt underwent R BKA on 3/9 PMH: uncontrolled DM-1, CKD stage IIIa, chronic diastolic heart failure, right foot osteomyelitis s/p ray amputation on 1/14   OT comments  Pt progressing towards established OT goals and benefits from increased encouragement for engagement in therapy. Upon arrival, pt EOB with husband and about to transfer to [facility] (on R). Providing education on benefits of BSC placement on L and pt agreeable; benefiting from calm, soothing cues. Pt performing toileting with Min Guard-Min A. Pt performing functional mobility to/from door with Min A +2 and RW. Pt reporting increased pain at mid-back; notified RN and pt repeatedly asking for pain medication. Recommend dc to home with HHOT and will continue to follow acutely as admitted.     Follow Up Recommendations  Home health OT;Supervision/Assistance - 24 hour    Equipment Recommendations  None recommended by OT    Recommendations for Other Services      Precautions / Restrictions Precautions Precautions: Fall Precaution Comments: wound vac Restrictions Weight Bearing Restrictions: Yes RLE Weight Bearing: Non weight bearing       Mobility Bed Mobility Overal bed mobility: Needs Assistance Bed Mobility: Sit to Supine       Sit to supine: Supervision   General bed mobility comments: Supervision for safety though no physical assist needed    Transfers Overall transfer level: Needs assistance Equipment used: 1 person hand held  assist Transfers: Sit to/from Stand;Stand Pivot Transfers Sit to Stand: Min assist;+2 safety/equipment Stand pivot transfers: Min assist;+2 safety/equipment       General transfer comment: Min A for managing hips and gaining balance    Balance Overall balance assessment: Needs assistance Sitting-balance support: Feet supported Sitting balance-Leahy Scale: Good Sitting balance - Comments: EOB without UE support   Standing balance support: Bilateral upper extremity supported Standing balance-Leahy Scale: Poor Standing balance comment: bil UE support to maintain Rt NWB                           ADL either performed or assessed with clinical judgement   ADL Overall ADL's : Needs assistance/impaired     Grooming: Set up;Sitting;Wash/dry hands                   Toilet Transfer: Minimal assistance;+2 for safety/equipment;Stand-pivot;BSC   Toileting- Clothing Manipulation and Hygiene: Min guard;Sitting/lateral lean;Minimal assistance Toileting - Clothing Manipulation Details (indicate cue type and reason): Able to doff underwear in standing with Min A for balance. Min Guard for peri care. Min A for pulling underwear over hips     Functional mobility during ADLs: Minimal assistance;Rolling walker General ADL Comments: Pt performing toileting at [facility] and then mobility to.from door.     Vision   Vision Assessment?: No apparent visual deficits   Perception     Praxis      Cognition Arousal/Alertness: Awake/alert Behavior During Therapy: Flat affect (very tearful) Overall Cognitive Status: Impaired/Different from baseline Area of Impairment: Safety/judgement  Safety/Judgement: Decreased awareness of deficits     General Comments: Crying out and tearful. Self limiting behaviors. Requiring increased encouragment throughout        Exercises     Shoulder Instructions       General Comments Pt's husband present  throughout session    Pertinent Vitals/ Pain       Pain Assessment: Faces Faces Pain Scale: Hurts little more Pain Location: Middle of back Pain Descriptors / Indicators: Crying Pain Intervention(s): Monitored during session;Limited activity within patient's tolerance;Repositioned  Home Living                                          Prior Functioning/Environment              Frequency  Min 2X/week        Progress Toward Goals  OT Goals(current goals can now be found in the care plan section)  Progress towards OT goals: Progressing toward goals  Acute Rehab OT Goals Patient Stated Goal: go home, decrease nausea OT Goal Formulation: With patient Time For Goal Achievement: 06/21/20 Potential to Achieve Goals: Good ADL Goals Pt Will Perform Lower Body Bathing: with set-up;sitting/lateral leans Pt Will Perform Lower Body Dressing: with set-up;sitting/lateral leans Pt Will Transfer to Toilet: with min assist;stand pivot transfer;bedside commode Pt Will Perform Toileting - Clothing Manipulation and hygiene: with set-up;sitting/lateral leans Pt/caregiver will Perform Home Exercise Program: Increased strength;Both right and left upper extremity;Independently;With written HEP provided  Plan Discharge plan remains appropriate    Co-evaluation    PT/OT/SLP Co-Evaluation/Treatment: Yes Reason for Co-Treatment: For patient/therapist safety;To address functional/ADL transfers   OT goals addressed during session: ADL's and self-care      AM-PAC OT "6 Clicks" Daily Activity     Outcome Measure   Help from another person eating meals?: A Little Help from another person taking care of personal grooming?: A Little Help from another person toileting, which includes using toliet, bedpan, or urinal?: A Little Help from another person bathing (including washing, rinsing, drying)?: A Little Help from another person to put on and taking off regular upper body  clothing?: A Little Help from another person to put on and taking off regular lower body clothing?: A Little 6 Click Score: 18    End of Session Equipment Utilized During Treatment: Rolling walker;Gait belt  OT Visit Diagnosis: Unsteadiness on feet (R26.81);Other abnormalities of gait and mobility (R26.89);Muscle weakness (generalized) (M62.81) Pain - Right/Left: Right Pain - part of body: Ankle and joints of foot   Activity Tolerance Patient tolerated treatment well   Patient Left with call bell/phone within reach;in bed;with family/visitor present   Nurse Communication Mobility status        Time: 3276-1470 OT Time Calculation (min): 31 min  Charges: OT General Charges $OT Visit: 1 Visit OT Treatments $Self Care/Home Management : 8-22 mins  [place], OTR/L Acute Rehab Pager: [phone] Office: [place] 06/11/2020, 4:43 PM

## 2020-06-11 NOTE — Progress Notes (Signed)
Physical Therapy Treatment Patient Details Name: [name] MRN: [id number] DOB: 01-02-1991 Today's Date: 06/11/2020    History of Present Illness [name] is a 30 y.o. female who presents with nonhealing right foot first ray amputation.  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  Patient has undergone wound care and has improved granulation tissue around the chronic draining sinus tract but still has a deep infection. 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 Pt tearful and reports increased pain and nausea. Pt and husband require education for safe placement and technique for transfer to [facility]. Pt requires minA for stand pivot to [facility]. After finished requires assist of 2 for steadying and pulling up briefs. With maximal encourgement pt able to hop to door and back. VC for upright posture and proximity to RW. Pt requests back to bed where husband can rub her back. Encouraged pt to lie on her belly but she reports she can't due to increased nausea. Educated on hip extension in future. D/c plans remain appropriate at this time. PT will continue to follow acutely.    Follow Up Recommendations  Home health PT;Supervision for mobility/OOB     Equipment Recommendations  None recommended by PT (has necessary equipment)       Precautions / Restrictions Precautions Precautions: Fall Precaution Comments: wound vac Restrictions Weight Bearing Restrictions: Yes RLE Weight Bearing: Non weight bearing    Mobility  Bed Mobility Overal bed mobility: Needs Assistance Bed Mobility: Sit to Supine       Sit to supine: Supervision   General bed mobility comments: Supervision for safety though no physical assist needed    Transfers Overall transfer level:  Needs assistance Equipment used: 1 person hand held assist Transfers: Sit to/from Stand;Stand Pivot Transfers Sit to Stand: Min assist;+2 safety/equipment Stand pivot transfers: Min assist;+2 safety/equipment       General transfer comment: Min A for managing hips and gaining balance  Ambulation/Gait Ambulation/Gait assistance: Min assist;+2 safety/equipment Gait Distance (Feet): 20 Feet Assistive device: Rolling walker (2 wheeled) Gait Pattern/deviations: Step-to pattern;Trunk flexed (hop to) Gait velocity: slowed Gait velocity interpretation: <1.31 ft/sec, indicative of household ambulator General Gait Details: min Ax2 for steadying at the hips and management of lines and leads, pt requiring increased encouragement, tearful throughout         Balance Overall balance assessment: Needs assistance Sitting-balance support: Feet supported Sitting balance-Leahy Scale: Good Sitting balance - Comments: EOB without UE support   Standing balance support: Bilateral upper extremity supported Standing balance-Leahy Scale: Poor Standing balance comment: bil UE support to maintain Rt NWB                            Cognition Arousal/Alertness: Awake/alert Behavior During Therapy: Flat affect (very tearful) Overall Cognitive Status: Impaired/Different from baseline Area of Impairment: Safety/judgement                         Safety/Judgement: Decreased awareness of deficits     General Comments: Crying out and tearful. Self limiting behaviors. Requiring increased encouragment throughout         General Comments General comments (skin integrity, edema, etc.): Pt's husband present throughout  session      Pertinent Vitals/Pain Pain Assessment: Faces Faces Pain Scale: Hurts little more Pain Location: Middle of back Pain Descriptors / Indicators: Crying Pain Intervention(s): Limited activity within patient's tolerance;Monitored during session;Repositioned            PT Goals (current goals can now be found in the care plan section) Acute Rehab PT Goals Patient Stated Goal: go home, decrease nausea PT Goal Formulation: With patient/family Time For Goal Achievement: 06/21/20 Potential to Achieve Goals: Good Progress towards PT goals: Progressing toward goals    Frequency    Min 3X/week      PT Plan Current plan remains appropriate    Co-evaluation PT/OT/SLP Co-Evaluation/Treatment: Yes Reason for Co-Treatment: For patient/therapist safety PT goals addressed during session: Mobility/safety with mobility OT goals addressed during session: ADL's and self-care      AM-PAC PT "6 Clicks" Mobility   Outcome Measure  Help needed turning from your back to your side while in a flat bed without using bedrails?: None Help needed moving from lying on your back to sitting on the side of a flat bed without using bedrails?: None Help needed moving to and from a bed to a chair (including a wheelchair)?: A Little Help needed standing up from a chair using your arms (e.g., wheelchair or bedside chair)?: A Little Help needed to walk in hospital room?: A Little Help needed climbing 3-5 steps with a railing? : A Lot 6 Click Score: 19    End of Session Equipment Utilized During Treatment: Gait belt Activity Tolerance: Patient tolerated treatment well Patient left: in bed;with call bell/phone within reach;with family/visitor present Nurse Communication: Mobility status;Weight bearing status PT Visit Diagnosis: Other abnormalities of gait and mobility (R26.89);Muscle weakness (generalized) (M62.81);Difficulty in walking, not elsewhere classified (R26.2)     Time: 7903-8333 PT Time Calculation (min) (ACUTE ONLY): 31 min  Charges:  $Gait Training: 8-22 mins                     Kaylynn Chamblin B. Migdalia Dk PT, DPT Acute Rehabilitation Services Pager [phone] Office [phone]    [place] 06/11/2020, 5:40 PM

## 2020-06-11 NOTE — Consult Note (Addendum)
[facility] Consult: 9:04 AM 06/11/2020  LOS: 7 days    Referring Provider: Dr Ronnie Derby  Primary Care Physician:  [name], MD Primary Gastroenterologist:  Dr. Havery Moros.    Reason for Consultation: Recurrent refractory nausea and vomiting.   HPI: LENNA HAGARTY is a 30 y.o. female.  DM 1.  DKA.  Malnutrition.  Stage 3 CKD.  Diastolic heart failure.  Normocytic anemia.  Depression/anxiety.  Gastroparesis.  Suspected cannabinoid hyperemesis syndrome.  Fibromyalgia.  Neuropathy.  S/p 2013 cholecystectomy.  Debridement peri-rectal abscess 03/2017.     11/18 - 02/17/20 admission to [place] with N/V and AKI.   12/22 - 03/24/20 admit w DKA, AKI, severe protein calorie malnutrition in setting of refractory nausea and vomiting. 1/10 -04/16/2020 admission management of right foot cellulitis/osteomyelitis/infected right foot ulcer.  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  Treated with remdesivir for COVID-19.  Received 2 PRBC for Hb 6.3.  Discharged with wound VAC in place 1/28 - 05/09/20 admission to [facility] for nausea/vomiting flare, right foot osteomyelitis. AKI.   05/02/2020 right foot I&D and removal of infected cuneiform bone.  Received 2 PRBCs, Hb nadir less than 7. 2/12 - 05/24/2022 admission for aspiration pneumonia requiring intubation.  Amputation site wound dehiscence, osteomyelitis, anasarca.  AMS in setting of combo ETOH and pain meds. Tox screen + for opiates, THC.  Hgb nadir 3.7 Discharged on Rocephin/daptomycin. Readmitted 3/7 with heart failure, multifocal bil pneumonia.   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  Anasarca attributed to heart failure, nephropathy, hypoalbuminemia.  Hyperkalemia treated with Lokelma. In addition to extensive lung opacities, diffuse body wall anasarca, CT of chest shows prominent,  nonpathologically enlarged mediastinal, hilar, axillary lymph nodes suspected reactive in etiology. Hgb nadir 6.8 >> 2 PRBCs >> 9.4.  MCV 87.  Serum glucose as high as 359,  A1c of 6.9.    In regards to her gastroparesis, she has not had much benefit from Reglan and it exacerbates diarrrhea.  At times prolonged QTC has prohibited use of domperidone.  Chronic diarrhea (treats w prn Imodium) inhibits use of Prucalopride (Motegrity).  Last OV w  Dr. Armbruster May 2021 at which time she was on Remeron, Phenergan 12.5 PO as well as PR as needed, Protonix, Bentyl.  In March 2020 she was referred and seen at [place] for consideration of gastric pacemaker but Covid intervened and she did not follow-up for GES or electrogastrogram.    Colonoscopy 06/27/2017 - normal colon and ileum- normal random biopsies.  Flex sig 03/10/2017 - poor prep, normal colon with normal biopsies. EGD 12/03/2011 - not completed due to retained food. EGD 10/05/2017 - retained gastric fluid which limited some views of body / fundus, otherwise normal, no outlet obstruction. 04/2011 dual phase GES:  0 minutes (100%): 100%  60 minutes (37-90%): 84%  120 minutes (30%-60%): 79%  180 minutes (10%-29%): 72%  240 minutes (0%-9%): 58% GES 08/2019: 95% emptied at 3 hours (norm is > 70% at 3 hours).   CT scan 03/23/2018 - distended stomach, mild transmural thickening of small and large  bowel, mild hepatic steatosis CT scan 01/20/20: Distal esophageal thickening, ?  Reflux esophagitis.  Diffuse fatty liver.  Diffuse colon wall thickening, ?underdistention vs pancolitis.   CT angio scan 12/23/2017 - mild wall thickening of transverse colon, marked distension of bladder, body wall and mesenteric edema CT renal stone protocol 05/31/19: Findings concerning for mild colitis. Clinical correlation recommended. No bowel obstruction. Normal appendix.  Dr Candiss Norse request assistance in mgt of N/V.  In setting of Dilaudid, oxycodone.  Anti emetics  currently consist of Reglan 99m po bid which she has been taking at home despite historically providing little benefit..  Pt has been refusing this, scheduled Protonix 40 mg po bid and other oral meds for past 36 hours.  Is accepting IV zofran, IV Phenergan prn.  Haldol prn for hiccups.  Generally feels miserable.  Taking the Dilaudid for pain in her right leg stump, not having much in the way of abdominal pain.  Emesis is brown, bilious.  Stools are liquid brown.     Past Medical History:  Diagnosis Date  . Allergy   . Anemia   . Anxiety   . Blood transfusion without reported diagnosis    Dec 2018  . Cataract    right eye  . COVID-19 03/2020  . Depression   . Diabetes type 1, uncontrolled [place] 11/14/2011   Since age 89 . Fibromyalgia   . Gastroparesis   . GERD (gastroesophageal reflux disease)   . Hypertension   . Infection    UTI April 2016    Past Surgical History:  Procedure Laterality Date  . AMPUTATION Right 04/13/2020   Procedure: 1ST RAY AMPUTATION RIGHT FOOT;  Surgeon: DNewt Minion MD;  [facility] [facility]  Service: Orthopedics;  Laterality: Right;  . AMPUTATION Right 06/06/2020   Procedure: RIGHT BELOW KNEE AMPUTATION;  Surgeon: DNewt Minion MD;  [facility] [facility]  Service: [place]opedics;  Laterality: Right;  . ANKLE SURGERY    . APPLICATION OF WOUND VAC Right 06/06/2020   Procedure: APPLICATION OF WOUND VAC;  Surgeon: DNewt Minion MD;  [place] [facility]  Service: Orthopedics;  Laterality: Right;  . CHOLECYSTECTOMY  11/15/2011   Procedure: LAPAROSCOPIC CHOLECYSTECTOMY WITH INTRAOPERATIVE CHOLANGIOGRAM;  Surgeon: SAdin Hector MD;  [facility];  Service: General;  Laterality: N/A;  . COLONOSCOPY    . COLONOSCOPY WITH PROPOFOL N/A 06/27/2017   Procedure: COLONOSCOPY WITH PROPOFOL;  Surgeon: JMilus Banister MD;  [facility];  Service: Endoscopy;  Laterality: N/A;  . ESOPHAGOGASTRODUODENOSCOPY  12/03/2011   Procedure: ESOPHAGOGASTRODUODENOSCOPY (EGD);   Surgeon: PBeryle Beams MD;  [facility];  Service: Endoscopy;  Laterality: N/A;  . FLEXIBLE SIGMOIDOSCOPY N/A 03/10/2017   Procedure: FLEXIBLE SIGMOIDOSCOPY;  Surgeon: HCarol Ada MD;  [facility];  Service: Endoscopy;  Laterality: N/A;  . INCISION AND DRAINAGE PERIRECTAL ABSCESS N/A 03/01/2017   Procedure: IRRIGATION AND DEBRIDEMENT PERIRECTAL ABSCESS;  Surgeon: NAlphonsa Overall MD;  [facility];  Service: General;  Laterality: N/A;  . IRRIGATION AND DEBRIDEMENT BUTTOCKS N/A 03/23/2017   Procedure: IRRIGATION AND DEBRIDEMENT BUTTOCKS, SETON PLACEMENT;  Surgeon: TLeighton Ruff MD;  Location: [facility];  Service: General;  Laterality: N/A;  . LAPAROSCOPY  11/23/2011   Procedure: LAPAROSCOPY DIAGNOSTIC;  Surgeon: BEdward Jolly MD;  Location: [facility];  Service: General;  Laterality: N/A;  . SIGMOIDOSCOPY    . UPPER GASTROINTESTINAL ENDOSCOPY    . WISDOM TOOTH EXTRACTION      Prior to Admission medications  Medication Sig Start Date End Date Taking? Authorizing Provider  acetaminophen (TYLENOL) 500 MG tablet Take 1 tablet (500 mg total) by mouth every 8 (eight) hours. 05/24/20 06/23/20 Yes Mercy Riding, MD  busPIRone (BUSPAR) 5 MG tablet Take 1 tablet (5 mg total) by mouth 3 (three) times daily. 02/03/20  Yes [name], MD  carvedilol (COREG) 12.5 MG tablet Take 1 tablet (12.5 mg total) by mouth 2 (two) times daily with a meal. 04/16/20 07/15/20 Yes Dahal, Marlowe Aschoff, MD  cefTRIAXone (ROCEPHIN) IVPB Inject 2 g into the vein daily for 21 days. Indication:  Wound infection/osteo right foot First Dose: Yes Last Day of Therapy:  06/13/20 Labs - Once weekly:  CBC/D and BMP, Labs - Every other week:  ESR and CRP Method of administration: IV Push Method of administration may be changed at the discretion of home infusion pharmacist based upon assessment of the patient and/or caregiver's ability to self-administer the medication ordered. 05/24/20 06/14/20 Yes Mercy Riding, MD   cyclobenzaprine (FLEXERIL) 10 MG tablet TAKE 1 TABLET(10 MG) BY MOUTH THREE TIMES DAILY AS NEEDED FOR MUSCLE SPASMS Patient taking differently: Take 10 mg by mouth 3 (three) times daily as needed for muscle spasms. 04/02/20  Yes [name], MD  daptomycin (CUBICIN) IVPB Inject 500 mg into the vein daily for 21 days. Indication:  Wound infection/osteo right foot  First Dose: Yes Last Day of Therapy:  06/13/20 Labs - Once weekly:  CBC/D, BMP, and CPK Labs - Every other week:  ESR and CRP Method of administration: IV Push Method of administration may be changed at the discretion of home infusion pharmacist based upon assessment of the patient and/or caregiver's ability to self-administer the medication ordered. 05/24/20 06/14/20 Yes Mercy Riding, MD  dicyclomine (BENTYL) 20 MG tablet Take 1 tablet (20 mg total) by mouth every 8 (eight) hours as needed for spasms. 11/25/19  Yes Petrucelli, Samantha R, PA-C  furosemide (LASIX) 40 MG tablet Take 1 tablet (40 mg total) by mouth daily. 05/24/20 11/20/20 Yes Mercy Riding, MD  insulin glargine (LANTUS) 100 UNIT/ML injection Inject 10 Units into the skin 2 (two) times daily. Morning & bedtime   Yes [provider]  insulin lispro (HUMALOG) 100 UNIT/ML injection Inject 0-5 Units into the skin 3 (three) times daily before meals. Sliding scale   Yes [provider]  loperamide (IMODIUM) 2 MG capsule Take 6 mg by mouth daily as needed for diarrhea or loose stools.    Yes [provider]  LORazepam (ATIVAN) 0.5 MG tablet TAKE 1 TABLET(0.5 MG) BY MOUTH TWICE DAILY AS NEEDED FOR ANXIETY Patient taking differently: Take 0.5 mg by mouth 2 (two) times daily as needed for anxiety. 03/07/20  Yes [name], MD  metoCLOPramide (REGLAN) 5 MG tablet Take 1 tablet (5 mg total) by mouth in the morning and at bedtime. Please keep your January appt for further refills. Thank you 03/28/20  Yes Armbruster, Carlota Raspberry, MD  mirtazapine (REMERON) 30 MG  tablet TAKE 1 TABLET BY MOUTH AT  BEDTIME Patient taking differently: Take 30 mg by mouth at bedtime. 01/17/20  Yes [name], MD  oxyCODONE (ROXICODONE) 5 MG immediate release tablet Take 1 tablet (5 mg total) by mouth every 6 (six) hours as needed for severe pain. 05/31/20  Yes Persons, Bevely Palmer, PA  pantoprazole (PROTONIX) 40 MG tablet Take 1 tablet (40 mg total) by mouth 2 (two) times daily. 12/06/19  Yes Armbruster, Carlota Raspberry, MD  silver sulfADIAZINE (SILVADENE) 1 % cream Apply 1 application topically daily. Apply small amount to foot wound daily cover with dry dressing 05/24/20  Yes Persons, Bevely Palmer, PA  Continuous Blood Gluc Sensor (DEXCOM G6 SENSOR) MISC USE AS DIRECTED WITH CONTINUOUS BLOOD GLUCOSE MONITOR. REPLACE EVERY 10 DAYS 02/27/20   Renato Shin, MD  Continuous Blood Gluc Transmit (DEXCOM G6 TRANSMITTER) MISC USE AS DIRECTED TO  MONITOR  GLUCOSE  LEVELS 05/10/20   Renato Shin, MD  gabapentin (NEURONTIN) 300 MG capsule Take 1 capsule (300 mg total) by mouth at bedtime. Patient not taking: Reported on 06/04/2020 04/20/20   [name], MD  Insulin NPH, Human,, Isophane, (HUMULIN N KWIKPEN) 100 UNIT/ML Kiwkpen Inject 10 Units into the skin 2 (two) times daily at 8 am and 10 pm. Patient not taking: Reported on 06/04/2020 05/24/20   Mercy Riding, MD  ondansetron (ZOFRAN ODT) 4 MG disintegrating tablet Take 1 tablet (4 mg total) by mouth every 8 (eight) hours as needed for nausea or vomiting. 04/06/20   Lucretia Kern, DO  potassium chloride SA (KLOR-CON) 20 MEQ tablet Take 1 tablet (20 mEq total) by mouth daily for 5 doses. 07/04/19 07/30/19  Janeece Fitting, PA-C    Scheduled Meds: . (feeding supplement) PROSource Plus  30 mL Oral TID BM  . acetaminophen  650 mg Oral Q8H  . amLODipine  10 mg Oral Daily  . busPIRone  5 mg Oral TID  . carvedilol  12.5 mg Oral BID WC  . Chlorhexidine Gluconate Cloth  6 each Topical Daily  . enoxaparin (LOVENOX) injection  40 mg Subcutaneous Daily  . feeding  supplement  1 Container Oral TID BM  . furosemide  40 mg Oral BID  . insulin aspart  0-6 Units Subcutaneous TID WC  . insulin glargine  8 Units Subcutaneous Daily  . mouth rinse  15 mL Mouth Rinse BID  . metoCLOPramide  5 mg Oral BID  . mirtazapine  30 mg Oral QHS  . multivitamin with minerals  1 tablet Oral Daily  . pantoprazole  40 mg Oral BID  . polyethylene glycol  17 g Oral Daily   Infusions: . sodium chloride 10 mL/hr at 06/07/20 0910   PRN Meds: cyclobenzaprine, haloperidol lactate, HYDROmorphone (DILAUDID) injection, labetalol, LORazepam, ondansetron (ZOFRAN) IV, oxyCODONE, promethazine, senna-docusate, sodium chloride flush   Allergies as of 06/04/2020 - Review Complete 06/04/2020  Allergen Reaction Noted  . Other Anaphylaxis 12/13/2011  . Lactose intolerance (gi) Diarrhea 01/22/2015    Family History  Problem Relation Age of Onset  . Diabetes Mother   . Hypertension Father   . Colon cancer Paternal Grandmother        pt thinks PGM was dx in her 56's  . Diabetes Paternal Grandmother   . Diabetes Maternal Grandmother   . Diabetes Maternal Grandfather   . Diabetes Paternal Grandfather   . Diabetes Other   . Breast cancer Paternal Aunt   . Esophageal cancer Neg Hx   . Liver cancer Neg Hx   . Pancreatic cancer Neg Hx   . Stomach cancer Neg Hx   . Rectal cancer Neg Hx     Social History   Socioeconomic History  . Marital status: Married    Spouse name: sergio  . Number of children: 0  . Years of education: college  . Highest education level: Not on file  Occupational History  . Occupation: [employer] call center    Employer: [employer]  Tobacco Use  .  Smoking status: Never Smoker  . Smokeless tobacco: Never Used  Vaping Use  . Vaping Use: Never used  Substance and Sexual Activity  . Alcohol use: No    Alcohol/week: 0.0 standard drinks  . Drug use: Yes    Types: Marijuana  . Sexual activity: Not Currently    Partners: Male    Birth  control/protection: None  Other Topics Concern  . Not on file  Social History Narrative  . Not on file   Social Determinants of Health   Financial Resource Strain: Not on file  Food Insecurity: Food Insecurity Present  . Worried About Charity fundraiser in the Last Year: Sometimes true  . Ran Out of Food in the Last Year: Sometimes true  Transportation Needs: No Transportation Needs  . Lack of Transportation (Medical): No  . Lack of Transportation (Non-Medical): No  Physical Activity: Not on file  Stress: Not on file  Social Connections: Not on file  Intimate Partner Violence: Not on file    REVIEW OF SYSTEMS: Constitutional: Weakness generally. ENT:  No nose bleeds Pulm: No trouble breathing.  No cough. CV:  No palpitations, no LE edema.  No angina. GU:  No hematuria, no frequency GI: See HPI. Heme: No unusual bleeding or bruising.  Chronic anemia as per HPI Transfusions: See HPI Neuro:  No headaches, no peripheral tingling or numbness.  No seizure, no syncope. Derm:  No itching, no rash or sores.  Endocrine:  No sweats or chills.  No polyuria or dysuria Immunization: Viewed vaccination history.  She was vaccinated for COVID-19 with [employer] x 2 in April 2021.    PHYSICAL EXAM: Vital signs in last 24 hours: Vitals:   06/10/20 2015 06/11/20 0500  BP: (!) 157/82 (!) 184/95  Pulse: 90 100  Resp: 12 12  Temp: 98.6 F (37 C) 98.2 F (36.8 C)  SpO2: 95%    Wt Readings from Last 3 Encounters:  06/11/20 85 kg  05/28/20 84.4 kg  05/19/20 80.9 kg    General: Looks poorly, chronically and acutely unwell, thin. Head: No facial asymmetry or swelling no signs of head trauma Eyes: No conjunctival pallor.  EOMI.  No scleral icterus Ears: Not hard of hearing Nose: No congestion or discharge Mouth: Oral mucosa moist, pink, clear. Neck: No JVD, no thyromegaly Lungs: Clear bilaterally.  No labored breathing or cough. Heart: RRR. Abdomen: Soft.  Not tender.  Not  distended.  Bowel sounds normal quality, hypoactive.   Rectal: No digital exam but liquid brown incontinent stool pooled at depends has a very green tint when applied to the testing card, not able to discern that blood is detected. Musc/Skeltl: General muscle wasting of the limbs Extremities: No CCE. Neurologic: Appropriate.  Alert x3.  No tremors.  Moves all 4 limbs. Skin: No rash, no sores, no telangiectasia Nodes: No cervical adenopathy Psych: Flat affect, cooperative.  Intake/Output from previous day: 03/13 0701 - 03/14 0700 In: 936.1 [I.V.:882.1; IV Piggyback:54] Out: 750 [Urine:750] Intake/Output this shift: No intake/output data recorded.  LAB RESULTS: Recent Labs    06/09/20 0415 06/09/20 1302 06/10/20 0328 06/11/20 0400  WBC 8.0  --  9.1 9.9  HGB 6.8* 8.1* 8.4* 9.4*  HCT 21.0* 24.5* 26.2* 28.4*  PLT 231  --  285 325   BMET Lab Results  Component Value Date   NA 137 06/11/2020   NA 133 (L) 06/10/2020   NA 133 (L) 06/09/2020   K 3.6 06/11/2020   K 4.1 06/10/2020  K 4.3 06/09/2020   CL 99 06/11/2020   CL 100 06/10/2020   CL 101 06/09/2020   CO2 25 06/11/2020   CO2 25 06/10/2020   CO2 25 06/09/2020   GLUCOSE 188 (H) 06/11/2020   GLUCOSE 289 (H) 06/10/2020   GLUCOSE 333 (H) 06/09/2020   BUN 30 (H) 06/11/2020   BUN 29 (H) 06/10/2020   BUN 27 (H) 06/09/2020   CREATININE 1.20 (H) 06/11/2020   CREATININE 1.33 (H) 06/10/2020   CREATININE 1.12 (H) 06/09/2020   CALCIUM 8.7 (L) 06/11/2020   CALCIUM 8.3 (L) 06/10/2020   CALCIUM 8.1 (L) 06/09/2020   LFT Recent Labs    06/09/20 0415 06/10/20 0328  PROT 6.0* 6.9  ALBUMIN 1.5* 1.6*  AST 32 26  ALT 24 26  ALKPHOS 159* 164*  BILITOT 0.3 0.3   PT/INR Lab Results  Component Value Date   INR 1.2 06/04/2020   INR 1.09 07/10/2017   INR 1.34 02/27/2017   Hepatitis Panel No results for input(s): HEPBSAG, HCVAB, HEPAIGM, HEPBIGM in the last 72 hours. C-Diff No components found for: CDIFF Lipase      Component Value Date/Time   LIPASE 17 04/20/2020 1543    Drugs of Abuse     Component Value Date/Time   LABOPIA POSITIVE (A) 05/12/2020 0052   COCAINSCRNUR NONE DETECTED 05/12/2020 0052   LABBENZ NONE DETECTED 05/12/2020 0052   AMPHETMU NONE DETECTED 05/12/2020 0052   THCU POSITIVE (A) 05/12/2020 0052   LABBARB NONE DETECTED 05/12/2020 0052     RADIOLOGY STUDIES: No results found.    IMPRESSION:   *   Acute on chronic diabetic gastroparesis.  Symptoms exacerbated by using oral and IV narcotics.  *    R foot osteomyelitis.  S/p R ray amputation 1 month ago, status post right BKA 3/9. Requiring narcotics for pain at amputation site.  *    Aspiration pneumonia.  Covid 19 treated w Remdesivir in mid Jan 2022.    *   Chronic anemia requiring frequent PRBCs.  2 PRBCs this admission.  Several PRBCs transfused in recent months  *   DM type 1 since age 27.    *   Diastolic heart failure.  *   Intermittent prolonged QTC.  *   AKI, CKD.      PLAN:     *  Changed Phenergan to 12.5 mg IV q 6 hours scheduled rather than prn. Leave Zofran prn for now.     ? Stop the po Reglan (not taking anyway)  *   Needs referral back to [facility] Dr Jerene Pitch, Dr Derrill Kay for [facility] and consideration for gastric pacemaker.     Azucena Freed  06/11/2020, 9:04 AM Phone [phone]  GI ATTENDING  History, laboratories, x-rays, prior endoscopy reports reviewed.  Patient seen and examined.  Agree with comprehensive consultation note as outlined above.  Patient with well-established diabetic gastroparesis.  Problems exacerbated by recent surgery and the need for narcotics for pain control.  On multiple agents for nausea vomiting as well as promotility agent.  The key will be cut off narcotics.  Long-term, agree with referral back to tertiary care center with expertise gastroparesis.  Docia Chuck. Geri Seminole., M.D. [facility] Division of Gastroenterology

## 2020-06-12 DIAGNOSIS — K3184 Gastroparesis: Secondary | ICD-10-CM

## 2020-06-12 DIAGNOSIS — E1143 Type 2 diabetes mellitus with diabetic autonomic (poly)neuropathy: Secondary | ICD-10-CM

## 2020-06-12 LAB — CBC WITH DIFFERENTIAL/PLATELET
Abs Immature Granulocytes: 0.05 10*3/uL (ref 0.00–0.07)
Basophils Absolute: 0 10*3/uL (ref 0.0–0.1)
Basophils Relative: 0 %
Eosinophils Absolute: 0 10*3/uL (ref 0.0–0.5)
Eosinophils Relative: 0 %
HCT: 27.5 % — ABNORMAL LOW (ref 36.0–46.0)
Hemoglobin: 9.1 g/dL — ABNORMAL LOW (ref 12.0–15.0)
Immature Granulocytes: 1 %
Lymphocytes Relative: 14 %
Lymphs Abs: 1.2 10*3/uL (ref 0.7–4.0)
MCH: 28.1 pg (ref 26.0–34.0)
MCHC: 33.1 g/dL (ref 30.0–36.0)
MCV: 84.9 fL (ref 80.0–100.0)
Monocytes Absolute: 0.5 10*3/uL (ref 0.1–1.0)
Monocytes Relative: 5 %
Neutro Abs: 7.3 10*3/uL (ref 1.7–7.7)
Neutrophils Relative %: 80 %
Platelets: 340 10*3/uL (ref 150–400)
RBC: 3.24 MIL/uL — ABNORMAL LOW (ref 3.87–5.11)
RDW: 14.5 % (ref 11.5–15.5)
WBC: 9.1 10*3/uL (ref 4.0–10.5)
nRBC: 0 % (ref 0.0–0.2)

## 2020-06-12 LAB — BASIC METABOLIC PANEL
Anion gap: 9 (ref 5–15)
BUN: 35 mg/dL — ABNORMAL HIGH (ref 6–20)
CO2: 27 mmol/L (ref 22–32)
Calcium: 8.3 mg/dL — ABNORMAL LOW (ref 8.9–10.3)
Chloride: 100 mmol/L (ref 98–111)
Creatinine, Ser: 1.26 mg/dL — ABNORMAL HIGH (ref 0.44–1.00)
GFR, Estimated: 59 mL/min — ABNORMAL LOW (ref >60)
Glucose, Bld: 134 mg/dL — ABNORMAL HIGH (ref 70–99)
Potassium: 3.1 mmol/L — ABNORMAL LOW (ref 3.5–5.1)
Sodium: 136 mmol/L (ref 135–145)

## 2020-06-12 LAB — MAGNESIUM: Magnesium: 1.9 mg/dL (ref 1.7–2.4)

## 2020-06-12 MED ORDER — POTASSIUM CHLORIDE 2 MEQ/ML IV SOLN
INTRAVENOUS | Status: DC
  Filled 2020-06-12 (×2): qty 1000

## 2020-06-12 MED ORDER — POTASSIUM CHLORIDE CRYS ER 20 MEQ PO TBCR
40.0000 meq | EXTENDED_RELEASE_TABLET | Freq: Once | ORAL | Status: AC
  Administered 2020-06-12: 40 meq via ORAL
  Filled 2020-06-12: qty 2

## 2020-06-12 MED ORDER — POTASSIUM CHLORIDE 2 MEQ/ML IV SOLN
INTRAVENOUS | Status: DC
  Filled 2020-06-12: qty 500

## 2020-06-12 MED ORDER — ONDANSETRON HCL 4 MG/2ML IJ SOLN
4.0000 mg | Freq: Four times a day (QID) | INTRAMUSCULAR | Status: DC
  Administered 2020-06-13 – 2020-06-14 (×7): 4 mg via INTRAVENOUS
  Filled 2020-06-12 (×7): qty 2

## 2020-06-12 MED ORDER — POTASSIUM CHLORIDE 2 MEQ/ML IV SOLN: INTRAVENOUS | Status: DC

## 2020-06-12 MED ORDER — POTASSIUM CHLORIDE 2 MEQ/ML IV SOLN
INTRAVENOUS | Status: DC
  Filled 2020-06-12 (×7): qty 1000

## 2020-06-12 MED ORDER — METOCLOPRAMIDE HCL 5 MG/ML IJ SOLN: 10.0000 mg | Freq: Three times a day (TID) | INTRAMUSCULAR | Status: DC

## 2020-06-12 NOTE — Progress Notes (Signed)
Patient ID: DAWT REEB, female   DOB: 01-23-1991, 30 y.o.   MRN: [id number] Patient is seen in follow-up status post transtibial amputation.  The dressing and shrinker are removed she does have swelling the wound edges are well approximated there is no ischemic changes no drainage no cellulitis no signs of infection.  Patient is tearful she states that she is upset because her leg itches and she cannot scratch it.  Patient is sitting with her leg dependent.  Discussed the importance of elevation with her leg level with her heart, the shrinker was really applied.  Patient's GFR is less than 60 so she is probably not a good candidate for Toradol.  Albumin is 1.6 and this is most likely the cause of her swelling.  Patient is on protein supplements.

## 2020-06-12 NOTE — Progress Notes (Signed)
Pt has refused most oral meds throughout the shift due to nausea despite round the clock phenergan. Refuses all other antiemetics because "They don't work". Pt has been educated regarding risks and benefits regarding taking medications prescribed by the MD.

## 2020-06-12 NOTE — Telephone Encounter (Signed)
Form completed and faxed. 

## 2020-06-12 NOTE — Progress Notes (Signed)
PROGRESS NOTE        PATIENT DETAILS Name: [name] Age: 30 y.o. Sex: female Date of Birth: [birth year] Admit Date: 06/04/2020 Admitting Physician Jonnie Finner, DO VHQ:IONGEX, Algis Greenhouse, MD  Brief Narrative: Patient is a 30 y.o. female with a history of DM-1, CKD stage IIIa, chronic diastolic heart failure-underwent right first ray amputation on 1/14 for right foot osteomyelitis-subsequently hospitalized from 2/12-2/24 for severe hypoxia requiring intubation due to aspiration pneumonia and wound dehiscence of amputation site-discharged home on IV antibiotics-presented with right foot osteomyelitis and anasarca. See below for further details  Significant events: 1/14>> right ray amputation by Dr. [name] 2/12-2/24>> hospitalization for hypoxia due to CHF/aspiration pneumonia, right foot osteo-s/p ray amputation with wound dehiscence-requiring intubation-subsequently discharged on daptomycin/ceftriaxone 3/7>> admit to 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 3/7>> CTA chest: No PE, right pleural effusion, extensive bilateral airspace opacities, anasarca  Antimicrobial therapy: Rocephin: 3/7>>3/11 Zithromax: 3/7>>3/11  Microbiology data: 2/13>> respiratory virus panel: Negative 2/13>> BAL: Cultures negative 2/13>> BAL FEB/fungal culture: Pending 2/14>> blood cultures: Negative 3/7>> blood cultures: No growth 3/7>> urine culture: No growth  Procedures : 1/14>> right ray amputation by Dr. [name] 2/13>> bronchoscopy 2/13-2/15>> intubation/extubation 2/16-2/19>> reintubated/extubated 3/9>> right BKA (Dr [name])  Consults: Orthopedics ID  DVT Prophylaxis : SCDs Start: 06/06/20 1609 enoxaparin  (LOVENOX) injection 40 mg Start: 06/04/20 1200   Subjective:  Patient in rm, 10/10 stump pain + nausea, no chest- no abd pain, wants pain meds.  Assessment/Plan:  Acute osteomyelitis of right midfoot-history of right first ray amputation on 1/14 with subsequent wound dehiscence: S/p right BKA on 3/9-postop pain much better-orthopedics following-with recommendations to discharge home once stable on Provana pump.  Patient is exhibiting some narcotic seeking behavior, requested staff to push Dilaudid fast into her IV and then falls asleep.  Will monitor closely.  Acute hypoxic respiratory failure due to HFpEF exacerbation +/-residual pneumonia: Hypoxia has resolved-on room air.  Chest x-ray/CT chest-still significant infiltrates but I suspect this is a sequelae of recent pneumonia.  Continue diuresis with IV Lasix.  Has finished a course of IV Rocephin/Zithromax.    Anasarca: Due to a combination of HFpEF, diabetic nephropathy/hypoalbuminemia.  Volume status has markedly improved, continue with IV Lasix ( dose reduced 3/13)- PRN metolazone .  Intake/output is not accurate-her weight over the past several days was also not accurate.    CKD stage IIIb: Creatinine close to baseline-follow closely.  Hypokalemia.  Replaced.    Normocytic anemia: Due to acute illness/CKD-no evidence of blood loss-drop in hemoglobin this morning is likely due to acute illness-being transfused 1 unit of PRBC today-repeat CBC tomorrow morning.  Hyperkalemia: Resolved after Lokelma.  History of gastroparesis: On Reglan besides multiple antiemetics continues to have nausea vomiting, have consulted GI on 06/12/2018.  Depression/anxiety: Stable continue BuSpar/Remeron  GERD: Continue PPI  HTN - due to persistent nausea and oral intake patient as needed IV hydralazine continued, added Catapres patch.  Monitor.  DM-1: Continue with 5 units of Lantus-and SSI.    Lab Results  Component Value Date   HGBA1C 6.9 (H)  06/04/2020   CBG (last 3)  No results for input(s): GLUCAP in  the last 72 hours.    Diet: Diet Order            Diet Carb Modified Fluid consistency: Thin; Room service appropriate? Yes  Diet effective now                  Code Status: Full code   Family Communication: Spouse-[name]-[phone]-bedside 06/12/20  Disposition Plan: Status is: Inpatient  Remains inpatient appropriate because:Inpatient level of care appropriate due to severity of illness   Dispo: The patient is from: Home              Anticipated d/c is to: Home              Patient currently is not medically stable to d/c.   Difficult to place patient No   Barriers to Discharge: Right foot osteomyelitis-hypoxia due to PNA-on IV antibiotics-transmetatarsal amputation scheduled for 3/9  Antimicrobial agents: Anti-infectives (From admission, onward)   Start     Dose/Rate Route Frequency Ordered Stop   06/06/20 1315  ceFAZolin (ANCEF) IVPB 2g/100 mL premix  Status:  Discontinued        2 g 200 mL/hr over 30 Minutes Intravenous On call to O.R. 06/05/20 1221 06/06/20 1543   06/04/20 1130  cefTRIAXone (ROCEPHIN) 2 g in sodium chloride 0.9 % 100 mL IVPB        2 g 200 mL/hr over 30 Minutes Intravenous Every 24 hours 06/04/20 1122 06/08/20 2359   06/04/20 1130  azithromycin (ZITHROMAX) tablet 500 mg        500 mg Oral Daily 06/04/20 1122 06/08/20 1129       Time spent: 25 minutes-Greater than 50% of this time was spent in counseling, explanation of diagnosis, planning of further management, and coordination of care.  MEDICATIONS: Scheduled Meds: . (feeding supplement) PROSource Plus  30 mL Oral TID BM  . acetaminophen  650 mg Oral Q8H  . amLODipine  10 mg Oral Daily  . busPIRone  5 mg Oral TID  . carvedilol  12.5 mg Oral BID WC  . Chlorhexidine Gluconate Cloth  6 each Topical Daily  . cloNIDine  0.3 mg Transdermal Weekly  . enoxaparin (LOVENOX) injection  40 mg Subcutaneous Daily  . feeding  supplement  1 Container Oral TID BM  . furosemide  40 mg Oral BID  . insulin aspart  0-6 Units Subcutaneous TID WC  . insulin glargine  8 Units Subcutaneous Daily  . mouth rinse  15 mL Mouth Rinse BID  . metoCLOPramide (REGLAN) injection  10 mg Intravenous Q8H  . mirtazapine  30 mg Oral QHS  . multivitamin with minerals  1 tablet Oral Daily  . pantoprazole  40 mg Oral BID  . polyethylene glycol  17 g Oral Daily  . potassium chloride  40 mEq Oral Once   Continuous Infusions: . sodium chloride 10 mL/hr at 06/07/20 0910  . promethazine (PHENERGAN) injection 12.5 mg (06/12/20 0803)   PRN Meds:.cyclobenzaprine, haloperidol lactate, HYDROmorphone (DILAUDID) injection, labetalol, LORazepam, ondansetron (ZOFRAN) IV, oxyCODONE, senna-docusate, sodium chloride flush   PHYSICAL EXAM: Vital signs: Vitals:   06/12/20 0434 06/12/20 0443 06/12/20 0516 06/12/20 0740  BP: (!) 167/93  (!) 155/78 (!) 164/82  Pulse: 93 89 87 91  Resp: _0 Temp: 99.2 F (37.3 C)   98 F (36.7 C)  TempSrc: Oral   Oral  SpO2: (!) 88% 95% 97% 99%  Weight:      Height:       Filed  Weights   06/09/20 0343 06/10/20 0508 06/11/20 0500  Weight: 83 kg 84.6 kg 85 kg   Body mass index is 29.34 kg/m.   Gen Exam:  Awake Alert, No new F.N deficits, Normal affect [place].AT,PERRAL Supple Neck,No JVD, No cervical lymphadenopathy appriciated.  Symmetrical Chest wall movement, Good air movement bilaterally, CTAB RRR,No Gallops, Rubs or new Murmurs, No Parasternal Heave +ve B.Sounds, Abd Soft, No tenderness, No organomegaly appriciated, No rebound - guarding or rigidity. No Cyanosis, R BKA stump clean   I have personally reviewed following labs and imaging studies  LABORATORY DATA:  Recent Labs  Lab 06/08/20 0320 06/09/20 0415 06/09/20 1302 06/10/20 0328 06/11/20 0400 06/12/20 0332  WBC 8.0 8.0  --  9.1 9.9 9.1  HGB 7.1* 6.8* 8.1* 8.4* 9.4* 9.1*  HCT 23.2* 21.0* 24.5* 26.2* 28.4* 27.5*  PLT 216 231  --   285 325 340  MCV 91.7 87.1  --  87.9 85.5 84.9  MCH 28.1 28.2  --  28.2 28.3 28.1  MCHC 30.6 32.4  --  32.1 33.1 33.1  RDW 15.4 15.2  --  14.6 14.4 14.5  LYMPHSABS  --   --   --   --  0.8 1.2  MONOABS  --   --   --   --  0.1 0.5  EOSABS  --   --   --   --  0.0 0.0  BASOSABS  --   --   --   --  0.1 0.0    Recent Labs  Lab 06/06/20 0258 06/07/20 0345 06/08/20 0320 06/09/20 0415 06/10/20 0328 06/11/20 0400 06/12/20 0332  NA 130* 135 134* 133* 133* 137 136  K 4.5 4.5 4.2 4.3 4.1 3.6 3.1*  CL 102 103 104 101 100 99 100  CO2 21* _0 GLUCOSE 350* 218* 242* 333* 289* 188* 134*  BUN 29* 24* 23* 27* 29* 30* 35*  CREATININE 1.48* 1.37* 1.26* 1.12* 1.33* 1.20* 1.26*  CALCIUM 7.8* 8.0* 7.9* 8.1* 8.3* 8.7* 8.3*  AST _1 32 26  --   --   ALT 38 _2 --   --   ALKPHOS 215* 181* 168* 159* 164*  --   --   BILITOT 0.4 0.5 0.4 0.3 0.3  --   --   ALBUMIN 1.6* 1.5* 1.6* 1.5* 1.6*  --   --   MG  --   --  1.5* 2.2  --  1.7 1.9  PROCALCITON 0.19  --   --   --   --   --   --       Cardiac Enzymes: No results for input(s): CKTOTAL, CKMB, CKMBINDEX, TROPONINI in the last 168 hours.  BNP (last 3 results) No results for input(s): PROBNP in the last 8760 hours.  Lipid Profile: No results for input(s): CHOL, HDL, LDLCALC, TRIG, CHOLHDL, LDLDIRECT in the last 72 hours.  Thyroid Function Tests: No results for input(s): TSH, T4TOTAL, FREET4, T3FREE, THYROIDAB in the last 72 hours.  Anemia Panel: No results for input(s): VITAMINB12, FOLATE, FERRITIN, TIBC, IRON, RETICCTPCT in the last 72 hours.  Urine analysis:    Component Value Date/Time   COLORURINE YELLOW 06/04/2020 0813   APPEARANCEUR CLEAR 06/04/2020 0813   LABSPEC 1.012 06/04/2020 0813   PHURINE 5.0 06/04/2020 0813   GLUCOSEU NEGATIVE 06/04/2020 0813   HGBUR SMALL (A) 06/04/2020 0813   BILIRUBINUR NEGATIVE 06/04/2020 0813   BILIRUBINUR negative 01/02/2015 1717  KETONESUR NEGATIVE 06/04/2020 0813    PROTEINUR 100 (A) 06/04/2020 0813   UROBILINOGEN 0.2 01/13/2015 0223   NITRITE NEGATIVE 06/04/2020 0813   LEUKOCYTESUR NEGATIVE 06/04/2020 0813    Sepsis Labs: Lactic Acid, Venous    Component Value Date/Time   LATICACIDVEN 0.6 06/04/2020 0420    MICROBIOLOGY: Recent Results (from the past 240 hour(s))  Blood culture (routine x 2)     Status: None   Collection Time: 06/04/20  3:47 AM   Specimen: BLOOD  Result Value Ref Range Status   Specimen Description   Final    BLOOD BLOOD LEFT FOREARM Performed at [facility], [place] [address] [place], [place] [address]    Special Requests   Final    BOTTLES DRAWN AEROBIC AND ANAEROBIC Blood Culture adequate volume Performed at [place] [address] [place], [place] [address]    Culture   Final    NO GROWTH 5 DAYS Performed at [facility], [place] [address], [place], [place] [address]    Report Status 06/09/2020 FINAL  Final  Blood culture (routine x 2)     Status: None   Collection Time: 06/04/20  5:07 AM   Specimen: BLOOD  Result Value Ref Range Status   Specimen Description   Final    BLOOD BLOOD RIGHT HAND Performed at [place] [address], [place], [place] [address]    Special Requests   Final    BOTTLES DRAWN AEROBIC AND ANAEROBIC Blood Culture adequate volume Performed at [place] [address], [place], [place] [address]    Culture   Final    NO GROWTH 5 DAYS Performed at [facility], [place] [address], [place], [place] [address]    Report Status 06/09/2020 FINAL  Final  Resp Panel by RT-PCR (Flu A&B, Covid) Nasopharyngeal Swab     Status: None   Collection Time: 06/04/20  7:18 AM   Specimen: Nasopharyngeal Swab; Nasopharyngeal(NP) swabs in vial transport medium  Result Value Ref Range Status   SARS Coronavirus 2 by RT PCR NEGATIVE NEGATIVE Final    Comment: (NOTE) SARS-CoV-2 target nucleic acids are NOT  DETECTED.  The SARS-CoV-2 RNA is generally detectable in upper respiratory specimens during the acute phase of infection. The lowest concentration of SARS-CoV-2 viral copies this assay can detect is 138 copies/mL. A negative result does not preclude SARS-Cov-2 infection and should not be used as the sole basis for treatment or other patient management decisions. A negative result may occur with  improper specimen collection/handling, submission of specimen other than nasopharyngeal swab, presence of viral mutation(s) within the areas targeted by this assay, and inadequate number of viral copies(<138 copies/mL). A negative result must be combined with clinical observations, patient history, and epidemiological information. The expected result is Negative.  Fact Sheet for Patients:  EntrepreneurPulse.com.au  Fact Sheet for Healthcare Providers:  IncredibleEmployment.be  This test is no t yet approved or cleared by the [place] FDA and  has been authorized for detection and/or diagnosis of SARS-CoV-2 by FDA under an Emergency Use Authorization (EUA). This EUA will remain  in effect (meaning this test can be used) for the duration of the COVID-19 declaration under Section 564(b)(1) of the Act, 21 U.S.C.section 360bbb-3(b)(1), unless the authorization is terminated  or revoked sooner.       Influenza A by PCR NEGATIVE NEGATIVE Final   Influenza B by PCR NEGATIVE NEGATIVE Final    Comment: (  NOTE) The Xpert Xpress SARS-CoV-2/FLU/RSV plus assay is intended as an aid in the diagnosis of influenza from Nasopharyngeal swab specimens and should not be used as a sole basis for treatment. Nasal washings and aspirates are unacceptable for Xpert Xpress SARS-CoV-2/FLU/RSV testing.  Fact Sheet for Patients: EntrepreneurPulse.com.au  Fact Sheet for Healthcare Providers: IncredibleEmployment.be  This test is not yet  approved or cleared by the [place] FDA and has been authorized for detection and/or diagnosis of SARS-CoV-2 by FDA under an Emergency Use Authorization (EUA). This EUA will remain in effect (meaning this test can be used) for the duration of the COVID-19 declaration under Section 564(b)(1) of the Act, 21 U.S.C. section 360bbb-3(b)(1), unless the authorization is terminated or revoked.  Performed at [facility], [place] [address] [place], [place] [address]   Urine culture     Status: None   Collection Time: 06/04/20  8:13 AM   Specimen: In/Out Cath Urine  Result Value Ref Range Status   Specimen Description   Final    IN/OUT CATH URINE Performed at [place] [address], [place], [place] [address]    Special Requests   Final    NONE Performed at [facility], [place] [address], [place], [place] [address]    Culture   Final    NO GROWTH Performed at [facility], [place] [address], [place], [place] [address]    Report Status 06/05/2020 FINAL  Final  MRSA PCR Screening     Status: None   Collection Time: 06/05/20  3:59 PM   Specimen: Nasal Mucosa; Nasopharyngeal  Result Value Ref Range Status   MRSA by PCR NEGATIVE NEGATIVE Final    Comment:        The GeneXpert MRSA Assay (FDA approved for NASAL specimens only), is one component of a comprehensive MRSA colonization surveillance program. It is not intended to diagnose MRSA infection nor to guide or monitor treatment for MRSA infections. Performed at [facility], [place] [address] [place],  [address]     RADIOLOGY STUDIES/RESULTS: DG Abd 1 View  Result Date: 06/11/2020 CLINICAL DATA:  Generalized abdominal pain EXAM: ABDOMEN - 1 VIEW COMPARISON:  05/16/2020 FINDINGS: No dilated large or small bowel. Small volume gas within the small bowel of the RIGHT abdomen. No gas in the rectum. Gas noted in the stomach. No pathologic calcifications. No  organomegaly. IMPRESSION: 1. Paucity of bowel gas. 2. No evidence of high-grade obstruction. Electronically Signed   By: Suzy Bouchard M.D.   On: 06/11/2020 10:29     LOS: 8 days   Lala Lund, MD  [facility]   06/12/2020, 10:38 AM

## 2020-06-12 NOTE — Progress Notes (Signed)
Patient is 1 week status post transtibial amputation.  She has been struggling with some nausea but otherwise is without complaints  Wound VAC was removed today.  She has well apposed wound edges no sign of dehiscence or necrosis.  Some bloody drainage.  Clean dry dressing was applied which she should change daily will need 1 week follow-up in our office

## 2020-06-12 NOTE — Progress Notes (Signed)
Blood pressure is 167/93.  PRN med given and a BP recheck is 155/78.  Patient is resting.  Will continue to monitor.

## 2020-06-12 NOTE — Progress Notes (Addendum)
Daily Rounding Note  06/12/2020, 12:41 PM  LOS: 8 days   SUBJECTIVE:   Chief complaint: N/V, gastroparesis.     Used Dilaudid 4 mg yest, 2 mg so far today.  Oxycodone: none yesterday, 10 mg today.  Still having loose stools, nausea, non-bloody emesis, R LE stump pain.     OBJECTIVE:         Vital signs in last 24 hours:    Temp:  [97.8 F (36.6 C)-99.2 F (37.3 C)] 98 F (36.7 C) (03/15 0740) Pulse Rate:  [87-93] 91 (03/15 0740) Resp:  [11-20] 12 (03/15 0740) BP: (121-173)/(78-93) 164/82 (03/15 0740) SpO2:  [88 %-100 %] 99 % (03/15 0740) Last BM Date: 06/11/20 Filed Weights   06/09/20 0343 06/10/20 0508 06/11/20 0500  Weight: 83 kg 84.6 kg 85 kg   General: cachectic, near tears.  Looks chronically unwell   Heart: RRR Chest: clear bil.  No cough or dyspnea.   Abdomen: soft, NT, ND.  BS hypoactive but no tinkling or high-pitched sounds  Extremities: no CCE Neuro/Psych:  Oriented x 3.  Depressed.  Near tears.  Back and forth weaving upper body motion  Intake/Output from previous day: 03/14 0701 - 03/15 0700 In: 50 [IV Piggyback:50] Out: -   Intake/Output this shift: No intake/output data recorded.  Lab Results: Recent Labs    06/10/20 0328 06/11/20 0400 06/12/20 0332  WBC 9.1 9.9 9.1  HGB 8.4* 9.4* 9.1*  HCT 26.2* 28.4* 27.5*  PLT 285 325 340   BMET Recent Labs    06/10/20 0328 06/11/20 0400 06/12/20 0332  NA 133* 137 136  K 4.1 3.6 3.1*  CL 100 99 100  CO2 25 25 27   GLUCOSE 289* 188* 134*  BUN 29* 30* 35*  CREATININE 1.33* 1.20* 1.26*  CALCIUM 8.3* 8.7* 8.3*   LFT Recent Labs    06/10/20 0328  PROT 6.9  ALBUMIN 1.6*  AST 26  ALT 26  ALKPHOS 164*  BILITOT 0.3   PT/INR No results for input(s): LABPROT, INR in the last 72 hours. Hepatitis Panel No results for input(s): HEPBSAG, HCVAB, HEPAIGM, HEPBIGM in the last 72 hours.  Studies/Results: DG Abd 1 View  Result Date:  06/11/2020 CLINICAL DATA:  Generalized abdominal pain EXAM: ABDOMEN - 1 VIEW COMPARISON:  05/16/2020 FINDINGS: No dilated large or small bowel. Small volume gas within the small bowel of the RIGHT abdomen. No gas in the rectum. Gas noted in the stomach. No pathologic calcifications. No organomegaly. IMPRESSION: 1. Paucity of bowel gas. 2. No evidence of high-grade obstruction. Electronically Signed   By: Suzy Bouchard M.D.   On: 06/11/2020 10:29   Scheduled Meds: . (feeding supplement) PROSource Plus  30 mL Oral TID BM  . acetaminophen  650 mg Oral Q8H  . amLODipine  10 mg Oral Daily  . busPIRone  5 mg Oral TID  . carvedilol  12.5 mg Oral BID WC  . Chlorhexidine Gluconate Cloth  6 each Topical Daily  . cloNIDine  0.3 mg Transdermal Weekly  . enoxaparin (LOVENOX) injection  40 mg Subcutaneous Daily  . feeding supplement  1 Container Oral TID BM  . furosemide  40 mg Oral BID  . insulin aspart  0-6 Units Subcutaneous TID WC  . insulin glargine  8 Units Subcutaneous Daily  . mouth rinse  15 mL Mouth Rinse BID  . metoCLOPramide (REGLAN) injection  10 mg Intravenous Q8H  . mirtazapine  30 mg Oral  QHS  . multivitamin with minerals  1 tablet Oral Daily  . pantoprazole  40 mg Oral BID  . polyethylene glycol  17 g Oral Daily   Continuous Infusions: . lactated ringers with kcl    . promethazine (PHENERGAN) injection 12.5 mg (06/12/20 0803)   PRN Meds:.cyclobenzaprine, haloperidol lactate, HYDROmorphone (DILAUDID) injection, labetalol, LORazepam, ondansetron (ZOFRAN) IV, oxyCODONE, senna-docusate, sodium chloride flush   ASSESMENT:   *   Acute on chronic diabetic gastroparesis.  Symptoms exacerbated by using oral and IV narcotics.  No rel progress.    *    R foot osteomyelitis.  S/p R ray amputation 1 month ago, R BKA 3/9. Requiring narcotics for post surgical pain.  *    Aspiration pneumonia.  Covid 19 treated w Remdesivir in mid Jan 2022.    *   Chronic anemia requiring frequent  PRBCs.  2 PRBCs this admission.  Several PRBCs transfused in recent months. Hgb improved 6.8 >> 9.1.     *   Chronic diarrhea.    *   DM type 1 since age 43.    *   Diastolic heart failure.  *   Intermittent prolonged QTC.  *   AKI, CKD.      PLAN   *   Stop IV reglan (stopped yesterday but was then resumed) as it historically conferred no benefit and exacerbates diarrhea.  Change Zofran to q 6 h scheduled.    Azucena Freed  06/12/2020, 12:41 PM Phone [phone]  GI ATTENDING  Interval history and data reviewed.  Agree with interval progress note as outlined above.  Longstanding diabetic gastroparesis exacerbated by acute illness and narcotics.  From a GI standpoint the recommendations remain chronic antiemetics for symptomatic relief, low residue diet and low-fat diet to be advanced as tolerated to facilitate easier gastric emptying, PPI therapy to avoid esophagitis from vomitus, treatment of acute medical problems, and some alternative pain medications to narcotics.  I expect this to be a slow process.  Nothing further to add in this setting.  However, long-term, she may benefit from the expertise of the motility team at [facility] and possible consideration of so-called gastric pacemaker placement.  We are available as needed.  Thank you.  Docia Chuck. Geri [place]., M.D. [facility] Division of Gastroenterology

## 2020-06-13 LAB — BASIC METABOLIC PANEL
Anion gap: 8 (ref 5–15)
BUN: 32 mg/dL — ABNORMAL HIGH (ref 6–20)
CO2: 26 mmol/L (ref 22–32)
Calcium: 8.2 mg/dL — ABNORMAL LOW (ref 8.9–10.3)
Chloride: 102 mmol/L (ref 98–111)
Creatinine, Ser: 1.06 mg/dL — ABNORMAL HIGH (ref 0.44–1.00)
GFR, Estimated: >60 mL/min (ref >60)
Glucose, Bld: 96 mg/dL (ref 70–99)
Potassium: 3.4 mmol/L — ABNORMAL LOW (ref 3.5–5.1)
Sodium: 136 mmol/L (ref 135–145)

## 2020-06-13 LAB — CBC WITH DIFFERENTIAL/PLATELET
Abs Immature Granulocytes: 0.04 10*3/uL (ref 0.00–0.07)
Basophils Absolute: 0.1 10*3/uL (ref 0.0–0.1)
Basophils Relative: 1 %
Eosinophils Absolute: 0.1 10*3/uL (ref 0.0–0.5)
Eosinophils Relative: 1 %
HCT: 27.5 % — ABNORMAL LOW (ref 36.0–46.0)
Hemoglobin: 8.9 g/dL — ABNORMAL LOW (ref 12.0–15.0)
Immature Granulocytes: 1 %
Lymphocytes Relative: 15 %
Lymphs Abs: 1.3 10*3/uL (ref 0.7–4.0)
MCH: 28 pg (ref 26.0–34.0)
MCHC: 32.4 g/dL (ref 30.0–36.0)
MCV: 86.5 fL (ref 80.0–100.0)
Monocytes Absolute: 0.6 10*3/uL (ref 0.1–1.0)
Monocytes Relative: 7 %
Neutro Abs: 6.6 10*3/uL (ref 1.7–7.7)
Neutrophils Relative %: 75 %
Platelets: 312 10*3/uL (ref 150–400)
RBC: 3.18 MIL/uL — ABNORMAL LOW (ref 3.87–5.11)
RDW: 14.5 % (ref 11.5–15.5)
WBC: 8.6 10*3/uL (ref 4.0–10.5)
nRBC: 0 % (ref 0.0–0.2)

## 2020-06-13 LAB — MAGNESIUM: Magnesium: 1.8 mg/dL (ref 1.7–2.4)

## 2020-06-13 MED ORDER — POTASSIUM CHLORIDE CRYS ER 20 MEQ PO TBCR
40.0000 meq | EXTENDED_RELEASE_TABLET | Freq: Once | ORAL | Status: AC
  Administered 2020-06-13: 40 meq via ORAL
  Filled 2020-06-13: qty 2

## 2020-06-13 MED ORDER — SODIUM CHLORIDE 0.9 % IV SOLN: 25.0000 mg | Freq: Three times a day (TID) | INTRAVENOUS | Status: DC | PRN

## 2020-06-13 MED ORDER — FUROSEMIDE 40 MG PO TABS
40.0000 mg | ORAL_TABLET | Freq: Every day | ORAL | Status: DC
  Administered 2020-06-13 – 2020-06-14 (×2): 40 mg via ORAL
  Filled 2020-06-13 (×2): qty 1

## 2020-06-13 MED ORDER — GABAPENTIN 600 MG PO TABS
300.0000 mg | ORAL_TABLET | Freq: Two times a day (BID) | ORAL | Status: DC
  Administered 2020-06-13 – 2020-06-14 (×3): 300 mg via ORAL
  Filled 2020-06-13 (×3): qty 1

## 2020-06-13 NOTE — TOC Progression Note (Signed)
Transition of Care [facility]) - Progression Note    Patient Details  Name: [name] MRN: [id number] Date of Birth: 08/15/1990  [facility] CM/SW Contact  Carles Collet, RN Phone Number: 06/13/2020, 12:04 PM  Clinical Narrative:   Damaris Schooner w spouse at bedside. Patient asleep. Discussed potential for DC tomorrow if pain well controlled.  Verified w attending that IV abx will not be needed at DC. Verified that Alvis Lemmings will be able to cover for [facility] services, anticipate [facility] PT, will need resumption orders. Patient has RW, WC, shower seat, 3/1 at home. Spouse declines any additional DME needs. VAC removed from stump.          Expected Discharge Plan and Services           Expected Discharge Date:  (unknown)                                     Social Determinants of Health (SDOH) Interventions    Readmission Risk Interventions Readmission Risk Prevention Plan 03/22/2020 09/26/2019 07/30/2018  Transportation Screening Complete Complete Complete  PCP or Specialist Appt within 3-5 Days - - Complete  HRI or Home Care Consult - - Not Complete  HRI or Home Care Consult comments - - NA  Social Work Consult for [place] Planning/Counseling - - Not Complete  SW consult not completed comments - - NA  Palliative Care Screening - - Not Applicable  Medication Review Press photographer) Complete - (No Data)  PCP or Specialist appointment within 3-5 days of discharge Complete Complete -  [place] or Home Care Consult Complete Complete -  SW Recovery Care/Counseling Consult Complete Complete -  Palliative Care Screening Not Applicable Not Applicable -  [place] Not Applicable Not Applicable -  Some recent data might be hidden

## 2020-06-13 NOTE — Progress Notes (Signed)
PROGRESS NOTE        PATIENT DETAILS Name: [name] Age: 30 y.o. Sex: female Date of Birth: 03-10-1991 Admit Date: 06/04/2020 Admitting Physician Jonnie Finner, DO HDQ:QIWLNL, Algis Greenhouse, MD  Brief Narrative: Patient is a 30 y.o. female with a history of DM-1, CKD stage IIIa, chronic diastolic heart failure-underwent right first ray amputation on 1/14 for right foot osteomyelitis-subsequently hospitalized from 2/12-2/24 for severe hypoxia requiring intubation due to aspiration pneumonia and wound dehiscence of amputation site-discharged home on IV antibiotics-presented with right foot osteomyelitis and anasarca. See below for further details  Significant events: 1/14>> right ray amputation by Dr. 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 3/7>> CTA chest: No PE, right pleural effusion, extensive bilateral airspace opacities, anasarca  Antimicrobial therapy: Rocephin: 3/7>>3/11 Zithromax: 3/7>>3/11  Microbiology data: 2/13>> respiratory virus panel: Negative 2/13>> BAL: Cultures negative 2/13>> BAL FEB/fungal culture: Pending 2/14>> blood cultures: Negative 3/7>> blood cultures: No growth 3/7>> urine culture: No growth  Procedures : 1/14>> right ray amputation by Dr. [name] 2/13>> bronchoscopy 2/13-2/15>> intubation/extubation 2/16-2/19>> reintubated/extubated 3/9>> right BKA (Dr [name])  Consults: Orthopedics ID, GI  DVT Prophylaxis : SCDs Start: 06/06/20  1609 enoxaparin (LOVENOX) injection 40 mg Start: 06/04/20 1200   Subjective:  Patient in bed, appears comfortable, denies any headache, no fever, no chest pain or pressure, no shortness of breath , no abdominal pain. Improved R.stump pain.   Assessment/Plan:  Acute osteomyelitis of right midfoot-history of right first ray amputation on 1/14 with subsequent wound dehiscence: S/p right BKA on 3/9-postop care of the stump site continued, seen by Ortho on 06/13/2020, wound VAC has been removed now only has compression stocking on the stump, had issues with postop pain which finally has seems to have improved on 06/13/2020, earlier she exhibited some narcotic seeking behavior, requested staff to push Dilaudid fast into her IV and then falls asleep.  Will monitor closely.  Acute hypoxic respiratory failure due to HFpEF exacerbation +/-residual pneumonia: Hypoxia has resolved-now on room air has finished antibiotic treatment, Lasix as needed continued.  Anasarca: Due to a combination of HFpEF, diabetic nephropathy/hypoalbuminemia.  Volume status has markedly improved, lasix switched to PO.    CKD stage IIIb: Creatinine close to baseline-follow closely.  Hypokalemia.  Replaced again.  Normocytic anemia: Due to acute illness/CKD-no evidence of blood loss-drop in hemoglobin this morning is likely due to acute illness-being transfused 1 unit of PRBC today-repeat CBC tomorrow morning.  History of gastroparesis: On Reglan besides multiple antiemetics continues to have nausea vomiting, have consulted GI on 06/12/2018.  Depression/anxiety: Stable continue BuSpar/Remeron  GERD: Continue PPI  HTN - due to persistent nausea and oral intake patient as needed IV hydralazine continued, added Catapres patch.  Monitor.  DM-1: Continue with 5 units of Lantus-and SSI.    Lab Results  Component Value Date   HGBA1C 6.9 (H) 06/04/2020   CBG (last 3)  No results for input(s): GLUCAP in the last 72  hours.    Diet: Diet Order  Diet Carb Modified Fluid consistency: Thin; Room service appropriate? Yes  Diet effective now                  Code Status: Full code   Family Communication: Spouse-[name]-bedside 06/12/20  Disposition Plan: Status is: Inpatient  Remains inpatient appropriate because:Inpatient level of care appropriate due to severity of illness   Dispo: The patient is from: Home              Anticipated d/c is to: Home              Patient currently is not medically stable to d/c.   Difficult to place patient No   Barriers to Discharge: Right foot osteomyelitis-hypoxia due to PNA-on IV antibiotics-transmetatarsal amputation scheduled for 3/9  Antimicrobial agents: Anti-infectives (From admission, onward)   Start     Dose/Rate Route Frequency Ordered Stop   06/06/20 1315  ceFAZolin (ANCEF) IVPB 2g/100 mL premix  Status:  Discontinued        2 g 200 mL/hr over 30 Minutes Intravenous On call to O.R. 06/05/20 1221 06/06/20 1543   06/04/20 1130  cefTRIAXone (ROCEPHIN) 2 g in sodium chloride 0.9 % 100 mL IVPB        2 g 200 mL/hr over 30 Minutes Intravenous Every 24 hours 06/04/20 1122 06/08/20 2359   06/04/20 1130  azithromycin (ZITHROMAX) tablet 500 mg        500 mg Oral Daily 06/04/20 1122 06/08/20 1129       Time spent: 25 minutes-Greater than 50% of this time was spent in counseling, explanation of diagnosis, planning of further management, and coordination of care.  MEDICATIONS: Scheduled Meds: . (feeding supplement) PROSource Plus  30 mL Oral TID BM  . acetaminophen  650 mg Oral Q8H  . amLODipine  10 mg Oral Daily  . busPIRone  5 mg Oral TID  . carvedilol  12.5 mg Oral BID WC  . Chlorhexidine Gluconate Cloth  6 each Topical Daily  . cloNIDine  0.3 mg Transdermal Weekly  . enoxaparin (LOVENOX) injection  40 mg Subcutaneous Daily  . feeding supplement  1 Container Oral TID BM  . furosemide  40 mg Oral Daily  . gabapentin   300 mg Oral BID  . insulin aspart  0-6 Units Subcutaneous TID WC  . insulin glargine  8 Units Subcutaneous Daily  . mouth rinse  15 mL Mouth Rinse BID  . mirtazapine  30 mg Oral QHS  . multivitamin with minerals  1 tablet Oral Daily  . ondansetron (ZOFRAN) IV  4 mg Intravenous Q6H  . pantoprazole  40 mg Oral BID  . polyethylene glycol  17 g Oral Daily  . potassium chloride  40 mEq Oral Once   Continuous Infusions: . lactated ringers with kcl 100 mL/hr at 06/13/20 0046  . promethazine (PHENERGAN) injection 12.5 mg (06/13/20 0231)  . promethazine (PHENERGAN) injection     PRN Meds:.cyclobenzaprine, haloperidol lactate, HYDROmorphone (DILAUDID) injection, labetalol, LORazepam, oxyCODONE, promethazine (PHENERGAN) injection, senna-docusate, sodium chloride flush   PHYSICAL EXAM: Vital signs: Vitals:   06/12/20 2100 06/12/20 2200 06/13/20 0400 06/13/20 0500  BP: (!) 153/86 (!) 156/80 (!) 180/91 (!) 155/96  Pulse: 85 86 90 97  Resp: 14 (!) _0 Temp:   98.7 F (37.1 C)   TempSrc:   Oral   SpO2: 97% 99% 99% 95%  Weight:    69.7 kg  Height:       Filed Weights   06/10/20 0508  06/11/20 0500 06/13/20 0500  Weight: 84.6 kg 85 kg 69.7 kg   Body mass index is 24.06 kg/m.   Gen Exam:  Awake Alert, No new F.N deficits, Normal affect [place].AT,PERRAL Supple Neck,No JVD, No cervical lymphadenopathy appriciated.  Symmetrical Chest wall movement, Good air movement bilaterally, CTAB RRR,No Gallops, Rubs or new Murmurs, No Parasternal Heave +ve B.Sounds, Abd Soft, No tenderness, No organomegaly appriciated, No rebound - guarding or rigidity. No Cyanosis, R BKA   I have personally reviewed following labs and imaging studies  LABORATORY DATA:  Recent Labs  Lab 06/09/20 0415 06/09/20 1302 06/10/20 0328 06/11/20 0400 06/12/20 0332 06/13/20 0326  WBC 8.0  --  9.1 9.9 9.1 8.6  HGB 6.8* 8.1* 8.4* 9.4* 9.1* 8.9*  HCT 21.0* 24.5* 26.2* 28.4* 27.5* 27.5*  PLT 231  --  285 325 340  312  MCV 87.1  --  87.9 85.5 84.9 86.5  MCH 28.2  --  28.2 28.3 28.1 28.0  MCHC 32.4  --  32.1 33.1 33.1 32.4  RDW 15.2  --  14.6 14.4 14.5 14.5  LYMPHSABS  --   --   --  0.8 1.2 1.3  MONOABS  --   --   --  0.1 0.5 0.6  EOSABS  --   --   --  0.0 0.0 0.1  BASOSABS  --   --   --  0.1 0.0 0.1    Recent Labs  Lab 06/07/20 0345 06/08/20 0320 06/09/20 0415 06/10/20 0328 06/11/20 0400 06/12/20 0332 06/13/20 0326  NA 135 134* 133* 133* 137 136 136  K 4.5 4.2 4.3 4.1 3.6 3.1* 3.4*  CL 103 104 101 100 99 100 102  CO2 _0 GLUCOSE 218* 242* 333* 289* 188* 134* 96  BUN 24* 23* 27* 29* 30* 35* 32*  CREATININE 1.37* 1.26* 1.12* 1.33* 1.20* 1.26* 1.06*  CALCIUM 8.0* 7.9* 8.1* 8.3* 8.7* 8.3* 8.2*  AST 21 17 32 26  --   --   --   ALT _1 --   --   --   ALKPHOS 181* 168* 159* 164*  --   --   --   BILITOT 0.5 0.4 0.3 0.3  --   --   --   ALBUMIN 1.5* 1.6* 1.5* 1.6*  --   --   --   MG  --  1.5* 2.2  --  1.7 1.9 1.8      Cardiac Enzymes: No results for input(s): CKTOTAL, CKMB, CKMBINDEX, TROPONINI in the last 168 hours.  BNP (last 3 results) No results for input(s): PROBNP in the last 8760 hours.  Lipid Profile: No results for input(s): CHOL, HDL, LDLCALC, TRIG, CHOLHDL, LDLDIRECT in the last 72 hours.  Thyroid Function Tests: No results for input(s): TSH, T4TOTAL, FREET4, T3FREE, THYROIDAB in the last 72 hours.  Anemia Panel: No results for input(s): VITAMINB12, FOLATE, FERRITIN, TIBC, IRON, RETICCTPCT in the last 72 hours.  Urine analysis:    Component Value Date/Time   COLORURINE YELLOW 06/04/2020 0813   APPEARANCEUR CLEAR 06/04/2020 0813   LABSPEC 1.012 06/04/2020 0813   PHURINE 5.0 06/04/2020 0813   GLUCOSEU NEGATIVE 06/04/2020 0813   HGBUR SMALL (A) 06/04/2020 0813   BILIRUBINUR NEGATIVE 06/04/2020 0813   BILIRUBINUR negative 01/02/2015 1717   KETONESUR NEGATIVE 06/04/2020 0813   PROTEINUR 100 (A) 06/04/2020 0813   UROBILINOGEN 0.2 01/13/2015  0223   NITRITE NEGATIVE 06/04/2020 0813   LEUKOCYTESUR  NEGATIVE 06/04/2020 0813    Sepsis Labs: Lactic Acid, Venous    Component Value Date/Time   LATICACIDVEN 0.6 06/04/2020 0420    MICROBIOLOGY: Recent Results (from the past 240 hour(s))  Blood culture (routine x 2)     Status: None   Collection Time: 06/04/20  3:47 AM   Specimen: BLOOD  Result Value Ref Range Status   Specimen Description   Final    BLOOD BLOOD LEFT FOREARM Performed at [facility] [address] [place], [place] [address]    Special Requests   Final    BOTTLES DRAWN AEROBIC AND ANAEROBIC Blood Culture adequate volume Performed at [facility] [address], [place], [place] [address]    Culture   Final    NO GROWTH 5 DAYS Performed at [facility], [place] [address] [place], [place] [address]    Report Status 06/09/2020 FINAL  Final  Blood culture (routine x 2)     Status: None   Collection Time: 06/04/20  5:07 AM   Specimen: BLOOD  Result Value Ref Range Status   Specimen Description   Final    BLOOD BLOOD RIGHT HAND Performed at [facility] [address], [place], [place] [address]    Special Requests   Final    BOTTLES DRAWN AEROBIC AND ANAEROBIC Blood Culture adequate volume Performed at [facility] [address], [place], [place] [address]    Culture   Final    NO GROWTH 5 DAYS Performed at [facility], [place] [address] [place], [place] [address]    Report Status 06/09/2020 FINAL  Final  Resp Panel by RT-PCR (Flu A&B, Covid) Nasopharyngeal Swab     Status: None   Collection Time: 06/04/20  7:18 AM   Specimen: Nasopharyngeal Swab; Nasopharyngeal(NP) swabs in vial transport medium  Result Value Ref Range Status   SARS Coronavirus 2 by RT PCR NEGATIVE NEGATIVE Final    Comment: (NOTE) SARS-CoV-2 target nucleic acids are NOT DETECTED.  The SARS-CoV-2 RNA is generally detectable in upper  respiratory specimens during the acute phase of infection. The lowest concentration of SARS-CoV-2 viral copies this assay can detect is 138 copies/mL. A negative result does not preclude SARS-Cov-2 infection and should not be used as the sole basis for treatment or other patient management decisions. A negative result may occur with  improper specimen collection/handling, submission of specimen other than nasopharyngeal swab, presence of viral mutation(s) within the areas targeted by this assay, and inadequate number of viral copies(<138 copies/mL). A negative result must be combined with clinical observations, patient history, and epidemiological information. The expected result is Negative.  Fact Sheet for Patients:  EntrepreneurPulse.com.au  Fact Sheet for Healthcare Providers:  IncredibleEmployment.be  This test is no t yet approved or cleared by the [place] FDA and  has been authorized for detection and/or diagnosis of SARS-CoV-2 by FDA under an Emergency Use Authorization (EUA). This EUA will remain  in effect (meaning this test can be used) for the duration of the COVID-19 declaration under Section 564(b)(1) of the Act, 21 U.S.C.section 360bbb-3(b)(1), unless the authorization is terminated  or revoked sooner.       Influenza A by PCR NEGATIVE NEGATIVE Final   Influenza B by PCR NEGATIVE NEGATIVE Final    Comment: (NOTE) The Xpert Xpress SARS-CoV-2/FLU/RSV plus assay is intended as an aid in the diagnosis of influenza from Nasopharyngeal swab specimens and should not be used as  a sole basis for treatment. Nasal washings and aspirates are unacceptable for Xpert Xpress SARS-CoV-2/FLU/RSV testing.  Fact Sheet for Patients: EntrepreneurPulse.com.au  Fact Sheet for Healthcare Providers: IncredibleEmployment.be  This test is not yet approved or cleared by the [place] FDA and has been  authorized for detection and/or diagnosis of SARS-CoV-2 by FDA under an Emergency Use Authorization (EUA). This EUA will remain in effect (meaning this test can be used) for the duration of the COVID-19 declaration under Section 564(b)(1) of the Act, 21 U.S.C. section 360bbb-3(b)(1), unless the authorization is terminated or revoked.  Performed at [facility], [place] [address], [place], [place] [address]   Urine culture     Status: None   Collection Time: 06/04/20  8:13 AM   Specimen: In/Out Cath Urine  Result Value Ref Range Status   Specimen Description   Final    IN/OUT CATH URINE Performed at [facility] [address], [place], [place] [address]    Special Requests   Final    NONE Performed at [facility], [place] [address] [place], [place] [address]    Culture   Final    NO GROWTH Performed at [facility], [place] [address] [place], [place] [address]    Report Status 06/05/2020 FINAL  Final  MRSA PCR Screening     Status: None   Collection Time: 06/05/20  3:59 PM   Specimen: Nasal Mucosa; Nasopharyngeal  Result Value Ref Range Status   MRSA by PCR NEGATIVE NEGATIVE Final    Comment:        The GeneXpert MRSA Assay (FDA approved for NASAL specimens only), is one component of a comprehensive MRSA colonization surveillance program. It is not intended to diagnose MRSA infection nor to guide or monitor treatment for MRSA infections. Performed at [facility], [place] [address] [place], [place] [address]     RADIOLOGY STUDIES/RESULTS: DG Abd 1 View  Result Date: 06/11/2020 CLINICAL DATA:  Generalized abdominal pain EXAM: ABDOMEN - 1 VIEW COMPARISON:  05/16/2020 FINDINGS: No dilated large or small bowel. Small volume gas within the small bowel of the RIGHT abdomen. No gas in the rectum. Gas noted in the stomach. No pathologic calcifications. No organomegaly. IMPRESSION: 1. Paucity of bowel gas. 2. No  evidence of high-grade obstruction. Electronically Signed   By: Suzy Bouchard M.D.   On: 06/11/2020 10:29     LOS: 9 days   Lala Lund, MD  [facility]   06/13/2020, 8:28 AM

## 2020-06-13 NOTE — Progress Notes (Signed)
Physical Therapy Treatment Patient Details Name: [name] MRN: [id number] DOB: January 26, 1991 Today's Date: 06/13/2020    History of Present Illness [name] is a 30 y.o. female who presents with nonhealing right foot first ray amputation.  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  Patient has undergone wound care and has improved granulation tissue around the chronic draining sinus tract but still has a deep infection. Pt underwent R BKA on 3/9 PMH: uncontrolled DM-1, CKD stage IIIa, chronic diastolic heart failure, right foot osteomyelitis s/p ray amputation on 1/14    PT Comments    RN provided pain medication and antinausea medication with hour of therapy. Pt asleep on entry and very upset because she reports not sleeping all night. Educated pt on purpose of medication to be able to work with therapy, and need to work during the day so that she can sleep at night. Pt tearful and requires maximal encouragement from husband and therapist to participate in therapy session. PT refused to leave room until she participated and ultimately pt relented. Pt is supervision for pivot to wheelchair where she was able to propel herself 200 feet with supervision. Pt reports increased back pain at end of session. Able to transfer back to bed with supervision and husband provided back rub. D/c plans remain appropriate at this time. PT will continue to follow acutely   Follow Up Recommendations  Home health PT;Supervision for mobility/OOB     Equipment Recommendations  None recommended by PT (has necessary equipment)       Precautions / Restrictions Precautions Precautions: Fall Precaution Comments: wound vac    Mobility  Bed Mobility Overal bed mobility: Needs Assistance Bed Mobility: Sit to Supine       Sit to supine: Supervision   General bed mobility comments: Supervision for safety though no physical assist needed     Transfers Overall transfer level: Needs assistance   Transfers: Stand Pivot Transfers   Stand pivot transfers: Min guard       General transfer comment: min guard for safety with stand pivot <>bed to wheelchair    Wheelchair Mobility Wheelchair Mobility Wheelchair mobility: Yes Wheelchair propulsion: Both upper extremities Wheelchair parts: Supervision/cueing Distance: 200 Wheelchair Assistance Details (indicate cue type and reason): no physical assist, managed lines and leads, provided maximal encouragement and had patient push herself to increase distance  Modified Rankin (Stroke Patients Only)       Balance Overall balance assessment: Needs assistance Sitting-balance support: Feet supported Sitting balance-Leahy Scale: Good Sitting balance - Comments: EOB without UE support                                    Cognition Arousal/Alertness: Awake/alert Behavior During Therapy: Flat affect (continues to be very tearful whenever she is asked to move with therapy) Overall Cognitive Status: Impaired/Different from baseline Area of Impairment: Safety/judgement                         Safety/Judgement: Decreased awareness of deficits     General Comments: Crying out and tearful. Self limiting behaviors. Requiring increased encouragment throughout         General Comments General comments (skin integrity, edema, etc.): pt husband and provides maximal encouragement and promise of back rub when finished with therapy, pt with increased HR with wheelchair mobility  Pertinent Vitals/Pain Pain Assessment: Faces Faces Pain Scale: Hurts little more Pain Location: Middle of back Pain Descriptors / Indicators: Crying;Moaning Pain Intervention(s): Limited activity within patient's tolerance;Monitored during session;Premedicated before session;Repositioned           PT Goals (current goals can now be found in the care plan section) Acute Rehab  PT Goals Patient Stated Goal: go home, decrease nausea PT Goal Formulation: With patient/family Time For Goal Achievement: 06/21/20 Potential to Achieve Goals: Good Progress towards PT goals: Progressing toward goals    Frequency    Min 3X/week      PT Plan Current plan remains appropriate       AM-PAC PT "6 Clicks" Mobility   Outcome Measure  Help needed turning from your back to your side while in a flat bed without using bedrails?: None Help needed moving from lying on your back to sitting on the side of a flat bed without using bedrails?: None Help needed moving to and from a bed to a chair (including a wheelchair)?: None Help needed standing up from a chair using your arms (e.g., wheelchair or bedside chair)?: A Little Help needed to walk in hospital room?: A Little Help needed climbing 3-5 steps with a railing? : A Lot 6 Click Score: 20    End of Session   Activity Tolerance: Patient tolerated treatment well Patient left: in bed;with call bell/phone within reach;with family/visitor present   PT Visit Diagnosis: Other abnormalities of gait and mobility (R26.89);Muscle weakness (generalized) (M62.81);Difficulty in walking, not elsewhere classified (R26.2)     Time: 8421-0312 PT Time Calculation (min) (ACUTE ONLY): 27 min  Charges:  $Therapeutic Activity: 8-22 mins $Wheel Chair Management: 8-22 mins                     Zelta Enfield B. Migdalia Dk PT, DPT Acute Rehabilitation Services Pager [phone] Office [phone]    [place] 06/13/2020, 12:21 PM

## 2020-06-13 NOTE — Progress Notes (Signed)
Patient is 8 days status post transtibial amputation.  Wound VAC was removed yesterday.  Today she is sleeping but easily wake up well.  She has little to say.  Does not complain of pain   We will continue to wear shrinker against the skin.  Should follow-up in our office in 1 week

## 2020-06-14 ENCOUNTER — Other Ambulatory Visit: Payer: Self-pay | Admitting: Family Medicine

## 2020-06-14 DIAGNOSIS — E1143 Type 2 diabetes mellitus with diabetic autonomic (poly)neuropathy: Secondary | ICD-10-CM

## 2020-06-14 DIAGNOSIS — K3184 Gastroparesis: Secondary | ICD-10-CM

## 2020-06-14 LAB — CBC WITH DIFFERENTIAL/PLATELET
Abs Immature Granulocytes: 0.04 10*3/uL (ref 0.00–0.07)
Basophils Absolute: 0.1 10*3/uL (ref 0.0–0.1)
Basophils Relative: 1 %
Eosinophils Absolute: 0.2 10*3/uL (ref 0.0–0.5)
Eosinophils Relative: 2 %
HCT: 27.6 % — ABNORMAL LOW (ref 36.0–46.0)
Hemoglobin: 8.8 g/dL — ABNORMAL LOW (ref 12.0–15.0)
Immature Granulocytes: 0 %
Lymphocytes Relative: 22 %
Lymphs Abs: 2.2 10*3/uL (ref 0.7–4.0)
MCH: 27.8 pg (ref 26.0–34.0)
MCHC: 31.9 g/dL (ref 30.0–36.0)
MCV: 87.3 fL (ref 80.0–100.0)
Monocytes Absolute: 0.8 10*3/uL (ref 0.1–1.0)
Monocytes Relative: 8 %
Neutro Abs: 6.7 10*3/uL (ref 1.7–7.7)
Neutrophils Relative %: 67 %
Platelets: 306 10*3/uL (ref 150–400)
RBC: 3.16 MIL/uL — ABNORMAL LOW (ref 3.87–5.11)
RDW: 14.5 % (ref 11.5–15.5)
WBC: 9.9 10*3/uL (ref 4.0–10.5)
nRBC: 0 % (ref 0.0–0.2)

## 2020-06-14 LAB — BASIC METABOLIC PANEL
Anion gap: 10 (ref 5–15)
BUN: 25 mg/dL — ABNORMAL HIGH (ref 6–20)
CO2: 25 mmol/L (ref 22–32)
Calcium: 8.3 mg/dL — ABNORMAL LOW (ref 8.9–10.3)
Chloride: 99 mmol/L (ref 98–111)
Creatinine, Ser: 1.14 mg/dL — ABNORMAL HIGH (ref 0.44–1.00)
GFR, Estimated: >60 mL/min (ref >60)
Glucose, Bld: 129 mg/dL — ABNORMAL HIGH (ref 70–99)
Potassium: 4 mmol/L (ref 3.5–5.1)
Sodium: 134 mmol/L — ABNORMAL LOW (ref 135–145)

## 2020-06-14 LAB — FUNGAL ORGANISM REFLEX

## 2020-06-14 LAB — FUNGUS CULTURE WITH STAIN

## 2020-06-14 LAB — MAGNESIUM: Magnesium: 1.8 mg/dL (ref 1.7–2.4)

## 2020-06-14 LAB — FUNGUS CULTURE RESULT

## 2020-06-14 MED ORDER — OXYCODONE HCL 5 MG PO TABS: 5.0000 mg | ORAL_TABLET | Freq: Four times a day (QID) | ORAL | 0 refills | Status: DC | PRN

## 2020-06-14 MED ORDER — ONDANSETRON 4 MG PO TBDP: 4.0000 mg | ORAL_TABLET | Freq: Three times a day (TID) | ORAL | 0 refills | Status: DC | PRN

## 2020-06-14 MED ORDER — AMLODIPINE BESYLATE 10 MG PO TABS
10.0000 mg | ORAL_TABLET | Freq: Every day | ORAL | 0 refills | Status: DC
Start: 2020-06-15 — End: 2020-11-08

## 2020-06-14 MED ORDER — HYDRALAZINE HCL 20 MG/ML IJ SOLN
10.0000 mg | Freq: Once | INTRAMUSCULAR | Status: AC
  Administered 2020-06-14: 10 mg via INTRAVENOUS
  Filled 2020-06-14: qty 1

## 2020-06-14 NOTE — Plan of Care (Signed)
  Problem: Education: Goal: Knowledge of General Education information will improve Description: Including pain rating scale, medication(s)/side effects and non-pharmacologic comfort measures Outcome: Adequate for Discharge   Problem: Health Behavior/Discharge Planning: Goal: Ability to manage health-related needs will improve Outcome: Adequate for Discharge   Problem: Clinical Measurements: Goal: Ability to maintain clinical measurements within normal limits will improve Outcome: Adequate for Discharge Goal: Will remain free from infection Outcome: Adequate for Discharge Goal: Diagnostic test results will improve Outcome: Adequate for Discharge Goal: Respiratory complications will improve Outcome: Adequate for Discharge   Problem: Activity: Goal: Risk for activity intolerance will decrease Outcome: Adequate for Discharge   Problem: Nutrition: Goal: Adequate nutrition will be maintained Outcome: Adequate for Discharge   Problem: Coping: Goal: Level of anxiety will decrease Outcome: Adequate for Discharge   Problem: Elimination: Goal: Will not experience complications related to bowel motility Outcome: Adequate for Discharge Goal: Will not experience complications related to urinary retention Outcome: Adequate for Discharge   Problem: Pain Managment: Goal: General experience of comfort will improve Outcome: Adequate for Discharge   Problem: Safety: Goal: Ability to remain free from injury will improve Outcome: Adequate for Discharge   Problem: Skin Integrity: Goal: Risk for impaired skin integrity will decrease Outcome: Adequate for Discharge

## 2020-06-14 NOTE — Discharge Summary (Signed)
[name] BTC:[id number] DOB: [birth year] DOA: 06/04/2020  PCP: [name], MD  Admit date: 06/04/2020  Discharge date: 06/14/2020  Admitted From: Home   Disposition:  Home   Recommendations for Outpatient Follow-up:   Follow up with PCP in 1-2 weeks  PCP Please obtain BMP/CBC, 2 view CXR in 1week,  (see Discharge instructions)   PCP Please follow up on the following pending results: Monitor blood pressure, CBC and BMP in 7 to 10 days.   Home Health: PT,RN Equipment/Devices: 3in1  Consultations: Ortho, GI, ID Discharge Condition: Stable    CODE STATUS: Full    Diet Recommendation: Heart Healthy - Low Carb  Diet Order            Diet Carb Modified Fluid consistency: Thin; Room service appropriate? Yes  Diet effective now                  Chief Complaint  Patient presents with  . Toe Pain     Brief history of present illness from the day of admission and additional interim summary    Patient is a 30 y.o. female with a history of DM-1, CKD stage IIIa, chronic diastolic heart failure-underwent right first ray amputation on 1/14 for right foot osteomyelitis-subsequently hospitalized from 2/12-2/24 for severe hypoxia requiring intubation due to aspiration pneumonia and wound dehiscence of amputation site-discharged home on IV antibiotics-presented with right foot osteomyelitis and anasarca. See below for further details  Significant events: 1/14>> right ray amputation by Dr. 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 3/7>> CTA chest: No PE, right pleural effusion, extensive bilateral airspace opacities, anasarca  Antimicrobial therapy: Rocephin: 3/7>>3/11 Zithromax: 3/7>>3/11  Microbiology data: 2/13>> respiratory virus panel: Negative 2/13>> BAL: Cultures negative 2/13>> BAL FEB/fungal culture: PCP for the fungal cultures which are still pending. 2/14>> blood cultures: Negative 3/7>> blood cultures: No growth 3/7>> urine culture: No growth  Procedures : 1/14>> right ray amputation by Dr. [name] 2/13>> bronchoscopy 2/13-2/15>> intubation/extubation 2/16-2/19>> reintubated/extubated 3/9>> right BKA (Dr [name])                                                                 [facility]     Acute osteomyelitis of right midfoot-history of right first ray amputation on 1/14 with subsequent wound dehiscence: S/p right BKA on 3/9-postop care of the stump site continued, seen by Ortho on 06/13/2020, wound VAC has been removed now only has compression stocking on the stump,  had issues with postop pain which finally has seems to have improved and she is requiring less narcotics, with low levels of narcotics nausea has improved as well.  She is back to her baseline and wants to be discharged home which will be done, wound care instructions provided instructed to the elastic shrinker over the stump and follow-up with Dr. [name] in 1 week.  Home health RN and PT also added.  Acute hypoxic respiratory failure due to HFpEF exacerbation +/-residual pneumonia: Hypoxia has resolved-now on room air has finished antibiotic treatment, Lasix as needed continued at home dose, this problem has resolved.  Anasarca: Due to a combination of HFpEF, diabetic nephropathy/hypoalbuminemia.  Volume status has markedly improved,  lasix switched to PO Lasix, continue at home dose follow with PCP.    CKD stage IIIb: Creatinine close to baseline-follow closely in the outpatient setting on Lasix by PCP.  Normocytic anemia: Due to acute illness/CKD-no evidence of blood loss-drop in hemoglobin likely due to perioperative blood loss and repeated blood draws s/p 1 unit of packed RBC transfusion this admission, H&H now stable PCP to follow.  History of gastroparesis: On Reglan besides multiple antiemetics usually when she was taking high doses of narcotics her nausea was not in good control, she was seen by GI, subsequently once the narcotic use point her nausea has now resolved.  Depression/anxiety: Stable continue BuSpar/Remeron  GERD: Continue PPI  HTN -much improved with improved nausea, continue home dose beta-blocker, added Norvasc.  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  Follow with Primary MD [name], MD in 7 days   Get CBC, CMP, 2 view Chest X ray -  checked next visit within 1 week by Primary MD   Activity: As tolerated with Full fall precautions use walker/cane & assistance as needed  Disposition Home    Diet: Heart Healthy  Low Carb  Accuchecks 4 times/day, Once in AM empty stomach and then before each meal. Log in all results and show them to your Prim.MD in 3 days. If any glucose reading is under 80 or above 300 call your Prim MD immidiately. Follow Low glucose instructions for  glucose under 80 as instructed.   Special Instructions: If you have smoked or chewed Tobacco  in the last 2 yrs please stop smoking, stop any regular Alcohol  and or any Recreational drug use.  On your next visit with your primary care physician please Get Medicines reviewed and adjusted.  Please request your Prim.MD to go over all Hospital Tests and Procedure/Radiological results at the follow up, please get all Hospital records sent to your Prim MD by signing hospital release before you go home.  If you experience worsening of your admission symptoms, develop shortness of breath, life threatening emergency, suicidal or homicidal thoughts you must seek medical attention immediately by calling 911 or calling your MD immediately  if symptoms less severe.  You Must read complete instructions/literature along with all the  possible adverse reactions/side effects for all the Medicines you take and that have been prescribed to you. Take any new Medicines after you have completely understood and accpet all the possible adverse reactions/side effects.   Do not drive, operate heavy machinery, perform activities at heights, swimming or participation in water activities or provide baby sitting services if your were admitted for syncope or siezures until you have seen by Primary MD or a Neurologist and advised to do so again.  Do not drive when taking Pain medications.  Do not take more than prescribed Pain, Sleep and Anxiety Medications  Wear Seat belts while driving.   Discharge wound care:   Complete by: As directed    Daily dry dressing change 4 x 4's Ace with shrinker may cleanse amputation stump with antibacterial soap and water   Increase activity slowly   Complete by: As directed       Discharge Medications   Allergies as of 06/14/2020      Reactions   Other Anaphylaxis   Reaction to [place] nuts   Lactose Intolerance (gi) Diarrhea      Medication List    STOP taking these medications    cefTRIAXone  IVPB Commonly known as: ROCEPHIN   daptomycin  IVPB Commonly known as: CUBICIN   gabapentin 300 MG capsule Commonly known as: NEURONTIN   HumuLIN N KwikPen 100 UNIT/ML Kiwkpen Generic drug: Insulin NPH (Human) (Isophane)     TAKE these medications   acetaminophen 500 MG tablet Commonly known as: TYLENOL Take 1 tablet (500 mg total) by mouth every 8 (eight) hours.   amLODipine 10 MG tablet Commonly known as: NORVASC Take 1 tablet (10 mg total) by mouth daily. Start taking on: June 15, 2020   busPIRone 5 MG tablet Commonly known as: BUSPAR Take 1 tablet (5 mg total) by mouth 3 (three) times daily.   carvedilol 12.5 MG tablet Commonly known as: COREG Take 1 tablet (12.5 mg total) by mouth 2 (two) times daily with a meal.   cyclobenzaprine 10 MG tablet Commonly known as: FLEXERIL TAKE 1 TABLET(10 MG) BY MOUTH THREE TIMES DAILY AS NEEDED FOR MUSCLE SPASMS What changed:   how much to take  how to take this  when to take this  reasons to take this  additional instructions   Dexcom G6 Sensor Misc USE AS DIRECTED WITH CONTINUOUS BLOOD GLUCOSE MONITOR. REPLACE EVERY 10 DAYS   Dexcom G6 Transmitter Misc USE AS DIRECTED TO  MONITOR  GLUCOSE  LEVELS   dicyclomine 20 MG tablet Commonly known as: BENTYL Take 1 tablet (20 mg total) by mouth every 8 (eight) hours as needed for spasms.   furosemide 40 MG tablet Commonly known as: Lasix Take 1 tablet (40 mg total) by mouth daily.   insulin glargine 100 UNIT/ML injection Commonly known as: LANTUS Inject 10 Units into the skin 2 (two) times daily. Morning & bedtime   insulin lispro 100 UNIT/ML injection Commonly known as: HUMALOG Inject 0-5 Units into the skin 3 (three) times daily before meals. Sliding scale   loperamide 2 MG capsule Commonly known as: IMODIUM Take 6 mg by mouth daily as needed for diarrhea or loose stools.   LORazepam 0.5 MG tablet Commonly known as: ATIVAN TAKE 1 TABLET(0.5 MG)  BY MOUTH TWICE DAILY AS NEEDED FOR ANXIETY What changed: See the new instructions.   metoCLOPramide 5 MG tablet Commonly known as: REGLAN Take 1 tablet (5 mg total) by mouth in the morning and at bedtime. Please  keep your January appt for further refills. Thank you   mirtazapine 30 MG tablet Commonly known as: REMERON TAKE 1 TABLET BY MOUTH AT  BEDTIME   ondansetron 4 MG disintegrating tablet Commonly known as: Zofran ODT Take 1 tablet (4 mg total) by mouth every 8 (eight) hours as needed for nausea or vomiting.   oxyCODONE 5 MG immediate release tablet Commonly known as: Roxicodone Take 1 tablet (5 mg total) by mouth every 6 (six) hours as needed for severe pain.   pantoprazole 40 MG tablet Commonly known as: PROTONIX Take 1 tablet (40 mg total) by mouth 2 (two) times daily.   silver sulfADIAZINE 1 % cream Commonly known as: Silvadene Apply 1 application topically daily. Apply small amount to foot wound daily cover with dry dressing            Durable Medical Equipment  (From admission, onward)         Start     Ordered   06/14/20 0927  DME 3-in-1  Once        06/14/20 5176           Discharge Care Instructions  (From admission, onward)         Start     Ordered   06/14/20 0000  Discharge wound care:       Comments: Daily dry dressing change 4 x 4's Ace with shrinker may cleanse amputation stump with antibacterial soap and water   06/14/20 1607           Follow-up Information    Suzan Slick, NP In 1 week.   Specialty: Orthopedic Surgery Contact information: [place] [address] [phone]        Care, [facility] Follow up.   Specialty: Home Health Services Why: To resume home health services. They will contact you in 1-2 days to set up your home appointment Contact information: [address] [place] [address] [phone]        [name], MD. Schedule an appointment as soon as possible  for a visit in 1 week(s).   Specialty: Family Medicine Contact information: [place] [address] [phone]        Irene Shipper, MD. Schedule an appointment as soon as possible for a visit in 1 week(s).   Specialty: Gastroenterology Contact information: [address] [place] [address] [phone]               Major procedures and Radiology Reports - PLEASE review detailed and final reports thoroughly  -      DG Abd 1 View  Result Date: 06/11/2020 CLINICAL DATA:  Generalized abdominal pain EXAM: ABDOMEN - 1 VIEW COMPARISON:  05/16/2020 FINDINGS: No dilated large or small bowel. Small volume gas within the small bowel of the RIGHT abdomen. No gas in the rectum. Gas noted in the stomach. No pathologic calcifications. No organomegaly. IMPRESSION: 1. Paucity of bowel gas. 2. No evidence of high-grade obstruction. Electronically Signed   By: Suzy Bouchard M.D.   On: 06/11/2020 10:29   DG Abd 1 View  Result Date: 05/16/2020 CLINICAL DATA:  OG tube placement EXAM: ABDOMEN - 1 VIEW COMPARISON:  05/13/2020 FINDINGS: OG tube tip is in the proximal to mid stomach. Mild gaseous distention of the stomach. IMPRESSION: OG tube tip in the stomach. Electronically Signed   By: Rolm Baptise M.D.   On: 05/16/2020 12:27   CT Angio Chest PE W/Cm &/  Or Wo Cm  Result Date: 06/04/2020 CLINICAL DATA:  Chest pain. Shortness of breath. Pleurisy versus pleural effusion. EXAM: CT ANGIOGRAPHY CHEST WITH CONTRAST TECHNIQUE: Multidetector CT imaging of the chest was performed using the standard protocol during bolus administration of intravenous contrast. Multiplanar CT image reconstructions and MIPs were obtained to evaluate the vascular anatomy. CONTRAST:  67m OMNIPAQUE IOHEXOL 300 MG/ML SOLN, 79mOMNIPAQUE IOHEXOL 350 MG/ML SOLN COMPARISON:  Chest radiograph-06/04/2020; 05/21/2020; 05/18/2020 FINDINGS: Vascular Findings: There is adequate opacification of the pulmonary arterial  system with the main pulmonary artery measuring 248 Hounsfield units. There are no discrete filling defects within the pulmonary arterial tree to the level of the bilateral subsegmental pulmonary arteries. Evaluation of the distal subsegmental pulmonary arteries is degraded secondary to patient respiratory artifact. Normal caliber of the main pulmonary artery. Borderline cardiomegaly.  No pericardial effusion. No evidence of thoracic aortic aneurysm or dissection. Bovine configuration of the aortic arch. The branch vessels of the aortic arch appear widely patent throughout their imaged courses. The descending thoracic aorta is of normal caliber and widely patent without hemodynamically significant narrowing. Right upper extremity PICC line tip terminates within the superior cavoatrial junction. Review of the MIP images confirms the above findings. ---------------------------------------------------------------------------------- Nonvascular Findings: Mediastinum/Lymph Nodes: Mediastinal, bilateral hilar and axillary lymph nodes are prominent though individually not enlarged by size criteria with index left axillary lymph node measuring 0.8 cm in greatest short axis diameter (image 21, series 4) and index right axillary lymph node measuring 0.6 cm (image 45), presumably reactive in etiology. Lungs/Pleura: Small to moderate sized right-sided pleural effusion with trace left-sided pleural effusion. Extensive bilateral slightly nodular heterogeneous and airspace opacities with diffuse interlobular septal thickening. Bibasilar subpleural consolidative opacities. Relative areas of confluence are seen within the superior segment of the left lower lobe (image 50, series 6), with associated air bronchograms. The central pulmonary airways appear patent.  No pneumothorax. Upper abdomen: Limited early arterial phase evaluation of the upper abdomen is normal. Musculoskeletal: Diffuse body wall anasarca. No acute or aggressive  osseous abnormalities. There is a punctate bone island within the T6 vertebral body. IMPRESSION: 1. No evidence of pulmonary embolism. 2. Small to moderate-sized right-sided pleural effusion and trace left-sided pleural effusion. 3. Extensive bilateral airspace opacities and interstitial thickening, nonspecific with broad differential considerations including multi lobar infection (including atypical etiology) and pulmonary edema. 4. Borderline cardiomegaly. 5. Diffuse body wall anasarca. 6. Prominent though non pathologically enlarged mediastinal, bilateral hilar and axillary lymph nodes, nonspecific though presumably reactive in etiology. Electronically Signed   By: JoSandi Mariscal.D.   On: 06/04/2020 07:50   CT FOOT RIGHT W CONTRAST  Result Date: 06/04/2020 CLINICAL DATA:  [age]ear old female with diabetes and foot swelling EXAM: CT OF THE LOWER RIGHT EXTREMITY WITH CONTRAST TECHNIQUE: Multidetector CT imaging of the lower right extremity was performed according to the standard protocol following intravenous contrast administration. CONTRAST:  7521mMNIPAQUE IOHEXOL 300 MG/ML SOLN, 25m65mNIPAQUE IOHEXOL 350 MG/ML SOLN COMPARISON:  X-ray 06/04/2020, MRI 04/10/2020, 04/30/2020 FINDINGS: Surgical wound at the medial aspect of the forefoot related to recent first ray resection. Erosive changes within the distal aspect of the medial cuneiform is highly suspicious for osteomyelitis (series 3, images 45-48). Redemonstration of Lisfranc fracture dislocation with dorsal and lateral dislocation of the second metatarsal base at the second TMT joint. There is also dorsal subluxation of the third metatarsal base at the third TMT joint. Numerous small fractures at the TMT joints including within the second and third metatarsal  bases as well as at the medial and intermediate cuneiform bones. There is also irregularity at the dorsal aspect of the distal cuboid. Scattered areas of heterotopic bone formation at the TMT joints.  Bones are demineralized. Partially visualized hardware at the medial malleolus and distal fibula. Soft tissue thickening at the medial aspect of the forefoot. No organized or rim enhancing fluid collection. Denervation changes of the intrinsic foot musculature. No significant tenosynovial fluid. IMPRESSION: 1. Surgical wound at the medial aspect of the forefoot related to recent first ray resection. Erosive changes within the distal aspect of the medial cuneiform is highly suspicious for osteomyelitis. 2. Redemonstration of Lisfranc fracture-dislocation with dorsal and lateral dislocation of the second metatarsal base at the second TMT joint. There is also dorsal subluxation of the third metatarsal base at the third TMT joint. 3. Numerous fractures at the TMT joints including within the second and third metatarsal bases as well as at the medial and intermediate cuneiform bones, and distal cuboid. 4. No organized or rim enhancing fluid collection. Electronically Signed   By: Davina Poke D.O.   On: 06/04/2020 08:22   DG Chest Port 1 View  Result Date: 06/04/2020 CLINICAL DATA:  Sepsis EXAM: PORTABLE CHEST 1 VIEW COMPARISON:  05/21/2020 FINDINGS: Right upper extremity PICC line tip within the superior vena cava. Diffuse asymmetric airspace infiltrate has improved, particularly within the right upper lobe, though persists in keeping with multifocal pneumonia. No pneumothorax or pleural effusion. Interval development of right lower lobe collapse. Cardiac size is mildly enlarged, unchanged. No acute bone abnormality. IMPRESSION: Interval development of right lower lobe collapse. Improving multifocal pulmonary infiltrates, most in keeping with multifocal pneumonia. Stable cardiomegaly Electronically Signed   By: Fidela Salisbury MD   On: 06/04/2020 04:19   DG Chest Port 1 View  Result Date: 05/21/2020 CLINICAL DATA:  Acute on chronic respiratory failure EXAM: PORTABLE CHEST 1 VIEW COMPARISON:  Two days ago  FINDINGS: Tracheal and esophageal extubation. Pulmonary opacity which is now greater in the upper lung zones. Mild cardiomegaly. No visible effusion or pneumothorax. Right PICC with tip at the upper cavoatrial junction IMPRESSION: Extubation with similar lung volumes. Interval change to upper lung zone predominant opacification, question interval edema. Electronically Signed   By: Monte Fantasia M.D.   On: 05/21/2020 04:43   DG Chest Port 1 View  Result Date: 05/19/2020 CLINICAL DATA:  30 year old female with acute respiratory distress. EXAM: PORTABLE CHEST 1 VIEW COMPARISON:  05/14/2020, 05/16/2020, 05/18/2020 FINDINGS: Unchanged position of endotracheal tube with the distal tip in the mid distal thoracic trachea. Right upper extremity PICC in place with the tip near the cavoatrial junction. The proximal side hole on the gastric decompression tube remains just superior to the gastroesophageal junction, the tip terminating off the inferior aspect of this image. Unchanged cardiomediastinal silhouette. Overall similar diffuse, peripheral lower lobe predominant hazy airspace opacities given differences in projection and technique. No new focal consolidation, pleural effusion, or pneumothorax. The visualized osseous structures are within normal limits. IMPRESSION: 1. Similar appearing diffuse airspace opacifications. 2. The gastric decompression tube proximal side hole is again just superior to the gastroesophageal junction. Consider advancement by approximately 5 cm for optimal function. Electronically Signed   By: Ruthann Cancer MD   On: 05/19/2020 11:20   DG Chest Port 1 View  Result Date: 05/18/2020 CLINICAL DATA:  Hypoxia EXAM: PORTABLE CHEST 1 VIEW COMPARISON:  May 16, 2020 FINDINGS: Endotracheal tube tip is 3.8 cm above the carina. Nasogastric tube tip is  in the stomach with the side port at the gastroesophageal junction. Central catheter tip is in the superior vena cava. No pneumothorax. There is  ill-defined airspace opacity throughout the lungs bilaterally, slightly less apparent compared to 2 days prior. A slight degree of consolidation is noted in the medial left base. No consolidation elsewhere heart remains upper normal in size with pulmonary vascularity within normal limits. No adenopathy. No bone lesions. IMPRESSION: Tube and catheter positions as described without pneumothorax. Note that the nasogastric tube side port is at the gastroesophageal junction. It may be prudent to consider advancing nasogastric tube 4-5 cm. Multifocal airspace opacity slightly less prominent than on previous study. Currently there is mild consolidation in the medial left base with no consolidation elsewhere. Stable cardiac silhouette. Electronically Signed   By: Lowella Grip III M.D.   On: 05/18/2020 08:10   Portable Chest x-ray  Result Date: 05/16/2020 CLINICAL DATA:  Hypoxia EXAM: PORTABLE CHEST 1 VIEW COMPARISON:  May 14, 2020 FINDINGS: Endotracheal tube tip is 2.4 cm above the carina. Nasogastric tube no longer present. Central catheter tip is in the superior vena cava near the cavoatrial junction. No pneumothorax. There is multifocal airspace opacity throughout the lungs bilaterally. There is less opacity toward the apex on the right compared to 2 days prior. Multifocal airspace opacity elsewhere appears essentially stable. Heart is upper normal in size with pulmonary vascularity normal. No adenopathy. No bone lesions. IMPRESSION: Tube and catheter positions as described without evident pneumothorax. Multifocal airspace opacity with less opacity in the right apex compared to previous study and multifocal airspace opacity elsewhere essentially stable. No new opacity evident. Appearance consistent with multifocal pneumonia. Correlation with COVID-19 status may be advisable. Note that noncardiogenic edema and ARDS are differential considerations. More than one of these entities may present concurrently.  Stable cardiac silhouette. Electronically Signed   By: Lowella Grip III M.D.   On: 05/16/2020 10:48   DG Chest Port 1V same Day  Result Date: 06/09/2020 CLINICAL DATA:  Shortness of breath EXAM: PORTABLE CHEST 1 VIEW COMPARISON:  June 04, 2020 chest radiograph and chest CT. FINDINGS: There is patchy airspace opacity in each mid lung region. There has been clearing of ill-defined airspace opacity from the bases compared to most recent chest radiograph. Previously noted pleural effusions seen on CT are not appreciable by radiography. There is mild cardiomegaly with pulmonary vascularity normal. No adenopathy. Central catheter tip is in the superior cava. No pneumothorax. No bone lesions. IMPRESSION: Patchy airspace opacity, likely representing foci of pneumonia, in each mid lung. Interval clearing of ill-defined opacity from the lung bases. No new opacity evident. Stable cardiac prominence. No pleural effusions evident by radiography. Central catheter tip in superior vena cava. Electronically Signed   By: Lowella Grip III M.D.   On: 06/09/2020 08:45   DG Foot Complete Right  Result Date: 06/04/2020 CLINICAL DATA:  Sepsis EXAM: RIGHT FOOT COMPLETE - 3+ VIEW COMPARISON:  None. FINDINGS: Three view radiograph of the right foot demonstrates surgical changes of first ray amputation distal to the a medial cuneiform. There is extensive soft tissue swelling of the right forefoot as well as a large open defect involving the medial aspect of the right forefoot in keeping with dehiscence of the surgical wound. Additionally, there has developed a fracture of the base of the second metatarsal with lateral subluxation of the second and likely the third and fourth metatarsal shafts in keeping with a Lisfranc fracture dislocation. ORIF of the medial malleolus utilizing  a single partially threaded screw and distal fibular ORIF utilizing lateral plate and screws are partially visualized. Advanced vascular  calcifications are seen within the soft tissues. IMPRESSION: Open wound involving the site of right first ray amputation with extensive soft tissue swelling of the right forefoot in keeping with dehiscence of the surgical wound and probable superimposed cellulitis. Interval development of a Lisfranc fracture dislocation with mild lateral subluxation of the second, third, and fourth metatarsals. This may be better assessed with CT imaging. Electronically Signed   By: Fidela Salisbury MD   On: 06/04/2020 04:26   ECHOCARDIOGRAM COMPLETE  Result Date: 05/16/2020    ECHOCARDIOGRAM REPORT   Patient Name:   CASH MEADOW Date of Exam: 05/16/2020 Medical Rec #:  [id number]      Height:       67.0 in Accession #:    [id number]     Weight:       177.0 lb Date of Birth:  1990-07-15      BSA:          1.920 m Patient Age:    29 years       BP:           110/60 mmHg Patient Gender: F              HR:           99 bpm. Exam Location:  Inpatient Procedure: 2D Echo, Cardiac Doppler and Color Doppler STAT ECHO  Results communicated to Dr Lake Bells at 12:29pm. Indications:     [place] respiratory distress R06.03  History:         Patient has prior history of Echocardiogram examinations, most                  recent 05/13/2020. PAD; Risk Factors:Hypertension and Diabetes.                  Sepsis. Chronic kidney disease.  Sonographer:     Darlina Sicilian RDCS Referring Phys:  [place] Diagnosing Phys: Oswaldo Milian MD IMPRESSIONS  1. Left ventricular ejection fraction, by estimation, is 55 to 60%. The left ventricle has normal function. The left ventricle has no regional wall motion abnormalities. There is mild left ventricular hypertrophy. Left ventricular diastolic parameters are consistent with Grade II diastolic dysfunction (pseudonormalization). Elevated left atrial pressure.  2. Right ventricular systolic function is normal. The right ventricular size is normal. There is mildly elevated pulmonary artery systolic  pressure.  3. The mitral valve is normal in structure. No evidence of mitral valve regurgitation.  4. The aortic valve is tricuspid. Aortic valve regurgitation is not visualized. No aortic stenosis is present. FINDINGS  Left Ventricle: Left ventricular ejection fraction, by estimation, is 55 to 60%. The left ventricle has normal function. The left ventricle has no regional wall motion abnormalities. The left ventricular internal cavity size was normal in size. There is  mild left ventricular hypertrophy. Left ventricular diastolic parameters are consistent with Grade II diastolic dysfunction (pseudonormalization). Elevated left atrial pressure. Right Ventricle: The right ventricular size is normal. No increase in right ventricular wall thickness. Right ventricular systolic function is normal. There is mildly elevated pulmonary artery systolic pressure. The tricuspid regurgitant velocity is 3.09  m/s, and with an assumed right atrial pressure of 8 mmHg, the estimated right ventricular systolic pressure is 35.3 mmHg. Left Atrium: Left atrial size was normal in size. Right Atrium: Right atrial size was normal in size. Pericardium: Trivial pericardial  effusion is present. Mitral Valve: The mitral valve is normal in structure. No evidence of mitral valve regurgitation. Tricuspid Valve: The tricuspid valve is normal in structure. Tricuspid valve regurgitation is trivial. Aortic Valve: The aortic valve is tricuspid. Aortic valve regurgitation is not visualized. No aortic stenosis is present. Pulmonic Valve: The pulmonic valve was grossly normal. Pulmonic valve regurgitation is not visualized. Aorta: The aortic root and ascending aorta are structurally normal, with no evidence of dilitation. IAS/Shunts: The interatrial septum was not well visualized.  LEFT VENTRICLE PLAX 2D LVIDd:         4.20 cm      Diastology LVIDs:         2.90 cm      LV e' medial:    7.51 cm/s LV PW:         1.10 cm      LV E/e' medial:  15.4 LV IVS:         1.30 cm      LV e' lateral:   8.05 cm/s LVOT diam:     1.90 cm      LV E/e' lateral: 14.4 LV SV:         60 LV SV Index:   31 LVOT Area:     2.84 cm  LV Volumes (MOD) LV vol d, MOD A2C: 110.0 ml LV vol d, MOD A4C: 123.0 ml LV vol s, MOD A2C: 55.6 ml LV vol s, MOD A4C: 59.5 ml LV SV MOD A2C:     54.4 ml LV SV MOD A4C:     123.0 ml LV SV MOD BP:      58.6 ml RIGHT VENTRICLE RV S prime:     10.10 cm/s TAPSE (M-mode): 1.9 cm LEFT ATRIUM             Index       RIGHT ATRIUM           Index LA diam:        3.90 cm 2.03 cm/m  RA Area:     11.50 cm LA Vol (A2C):   36.3 ml 18.91 ml/m RA Volume:   24.20 ml  12.60 ml/m LA Vol (A4C):   40.0 ml 20.83 ml/m LA Biplane Vol: 38.4 ml 20.00 ml/m  AORTIC VALVE LVOT Vmax:   140.00 cm/s LVOT Vmean:  111.000 cm/s LVOT VTI:    0.211 m  AORTA Ao Root diam: 2.20 cm Ao Asc diam:  2.20 cm MITRAL VALVE                TRICUSPID VALVE MV Area (PHT): 3.31 cm     TR Peak grad:   38.2 mmHg MV Decel Time: 229 msec     TR Vmax:        309.00 cm/s MV E velocity: 116.00 cm/s MV A velocity: 62.40 cm/s   SHUNTS MV E/A ratio:  1.86         Systemic VTI:  0.21 m                             Systemic Diam: 1.90 cm Oswaldo Milian MD Electronically signed by Oswaldo Milian MD Signature Date/Time: 05/16/2020/12:27:53 PM    Final (Updated)    VAS [place] LOWER EXTREMITY VENOUS (DVT)  Result Date: 05/17/2020  Lower Venous DVT Study Indications: SOB, and Pt is on a vent.  Risk Factors: None identified. Comparison Study: Previous 8/21 Negative Performing Technologist: Vonzell Schlatter RVT  Examination Guidelines: A complete evaluation includes B-mode imaging, spectral Doppler, color Doppler, and power Doppler as needed of all accessible portions of each vessel. Bilateral testing is considered an integral part of a complete examination. Limited examinations for reoccurring indications may be performed as noted. The reflux portion of the exam is performed with the patient in reverse Trendelenburg.   +---------+---------------+---------+-----------+----------+--------------+ RIGHT    CompressibilityPhasicitySpontaneityPropertiesThrombus Aging +---------+---------------+---------+-----------+----------+--------------+ CFV      Full           Yes      Yes                                 +---------+---------------+---------+-----------+----------+--------------+ SFJ      Full                                                        +---------+---------------+---------+-----------+----------+--------------+ FV Prox  Full                                                        +---------+---------------+---------+-----------+----------+--------------+ FV Mid   Full                                                        +---------+---------------+---------+-----------+----------+--------------+ FV DistalFull                                                        +---------+---------------+---------+-----------+----------+--------------+ PFV      Full                                                        +---------+---------------+---------+-----------+----------+--------------+ POP      Full           Yes      Yes                                 +---------+---------------+---------+-----------+----------+--------------+ PTV      Full                                                        +---------+---------------+---------+-----------+----------+--------------+ PERO     Full                                                        +---------+---------------+---------+-----------+----------+--------------+   +---------+---------------+---------+-----------+----------+--------------+  LEFT     CompressibilityPhasicitySpontaneityPropertiesThrombus Aging +---------+---------------+---------+-----------+----------+--------------+ CFV      Full           Yes      Yes                                  +---------+---------------+---------+-----------+----------+--------------+ SFJ      Full                                                        +---------+---------------+---------+-----------+----------+--------------+ FV Prox  Full                                                        +---------+---------------+---------+-----------+----------+--------------+ FV Mid   Full                                                        +---------+---------------+---------+-----------+----------+--------------+ FV DistalFull                                                        +---------+---------------+---------+-----------+----------+--------------+ PFV      Full                                                        +---------+---------------+---------+-----------+----------+--------------+ POP      Full           Yes      Yes                                 +---------+---------------+---------+-----------+----------+--------------+ PTV      Full                                                        +---------+---------------+---------+-----------+----------+--------------+ PERO     Full                                                        +---------+---------------+---------+-----------+----------+--------------+  Summary: BILATERAL: - No evidence of deep vein thrombosis seen in the lower extremities, bilaterally. - RIGHT: - A cystic structure is found in the popliteal fossa.   *See table(s) above for measurements and observations. Electronically signed by Monica Martinez MD on 05/17/2020 at 4:31:07  PM.    Final     Micro Results     Recent Results (from the past 240 hour(s))  MRSA PCR Screening     Status: None   Collection Time: 06/05/20  3:59 PM   Specimen: Nasal Mucosa; Nasopharyngeal  Result Value Ref Range Status   MRSA by PCR NEGATIVE NEGATIVE Final    Comment:        The GeneXpert MRSA Assay (FDA approved for NASAL specimens only), is  one component of a comprehensive MRSA colonization surveillance program. It is not intended to diagnose MRSA infection nor to guide or monitor treatment for MRSA infections. Performed at [facility], [place] [address], [place], [place] [address]     Today   Subjective    Ambrie Carte today has no headache,no chest abdominal pain,no new weakness tingling or numbness, feels much better wants to go home today    Objective   Blood pressure 139/76, pulse 87, temperature 98 F (36.7 C), temperature source Oral, resp. rate 12, height 5' 7.01" (1.702 m), weight 70.8 kg, SpO2 98 %.   Intake/Output Summary (Last 24 hours) at 06/14/2020 0928 Last data filed at 06/14/2020 0528 Gross per 24 hour  Intake 2813.19 ml  Output --  Net 2813.19 ml    Exam  Awake Alert, No new F.N deficits, Normal affect [place].AT,PERRAL Supple Neck,No JVD, No cervical lymphadenopathy appriciated.  Symmetrical Chest wall movement, Good air movement bilaterally, CTAB RRR,No Gallops,Rubs or new Murmurs, No Parasternal Heave +ve B.Sounds, Abd Soft, Non tender, No organomegaly appriciated, No rebound -guarding or rigidity. No Cyanosis,R-AKA stump clean   Data Review   CBC w Diff:  Lab Results  Component Value Date   WBC 9.9 06/14/2020   HGB 8.8 (L) 06/14/2020   HCT 27.6 (L) 06/14/2020   PLT 306 06/14/2020   LYMPHOPCT 22 06/14/2020   MONOPCT 8 06/14/2020   EOSPCT 2 06/14/2020   BASOPCT 1 06/14/2020    CMP:  Lab Results  Component Value Date   NA 134 (L) 06/14/2020   NA 134 (A) 02/17/2020   K 4.0 06/14/2020   CL 99 06/14/2020   CO2 25 06/14/2020   BUN 25 (H) 06/14/2020   BUN 45 (A) 02/17/2020   CREATININE 1.14 (H) 06/14/2020   CREATININE 0.48 (L) 11/07/2014   GLU 185 02/17/2020   PROT 6.9 06/10/2020   ALBUMIN 1.6 (L) 06/10/2020   BILITOT 0.3 06/10/2020   ALKPHOS 164 (H) 06/10/2020   AST 26 06/10/2020   ALT 26 06/10/2020  .   Total Time in preparing paper work, data evaluation and  todays exam - 45 minutes  Lala Lund M.D on 06/14/2020 at 9:28 AM  [facility]

## 2020-06-14 NOTE — Discharge Instructions (Signed)
Continue to wear shrinker over the R.Leg stump, keep wound clean and dry.  Follow with Primary MD [name], MD in 7 days   Get CBC, CMP, 2 view Chest X ray -  checked next visit within 1 week by Primary MD   Activity: As tolerated with Full fall precautions use walker/cane & assistance as needed  Disposition Home    Diet: Heart Healthy  Low Carb  Accuchecks 4 times/day, Once in AM empty stomach and then before each meal. Log in all results and show them to your Prim.MD in 3 days. If any glucose reading is under 80 or above 300 call your Prim MD immidiately. Follow Low glucose instructions for glucose under 80 as instructed.   Special Instructions: If you have smoked or chewed Tobacco  in the last 2 yrs please stop smoking, stop any regular Alcohol  and or any Recreational drug use.  On your next visit with your primary care physician please Get Medicines reviewed and adjusted.  Please request your Prim.MD to go over all Hospital Tests and Procedure/Radiological results at the follow up, please get all Hospital records sent to your Prim MD by signing hospital release before you go home.  If you experience worsening of your admission symptoms, develop shortness of breath, life threatening emergency, suicidal or homicidal thoughts you must seek medical attention immediately by calling 911 or calling your MD immediately  if symptoms less severe.  You Must read complete instructions/literature along with all the possible adverse reactions/side effects for all the Medicines you take and that have been prescribed to you. Take any new Medicines after you have completely understood and accpet all the possible adverse reactions/side effects.   Do not drive, operate heavy machinery, perform activities at heights, swimming or participation in water activities or provide baby sitting services if your were admitted for syncope or siezures until you have seen by Primary MD or a Neurologist and  advised to do so again.  Do not drive when taking Pain medications.  Do not take more than prescribed Pain, Sleep and Anxiety Medications  Wear Seat belts while driving.

## 2020-06-14 NOTE — Progress Notes (Signed)
Occupational Therapy Treatment Patient Details Name: [name] MRN: [id number] DOB: 08/30/1990 Today's Date: 06/14/2020    History of present illness [name] is a 30 y.o. female who presents with nonhealing right foot first ray amputation.  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  Patient has undergone wound care and has improved granulation tissue around the chronic draining sinus tract but still has a deep infection. 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 Pt demonstrated improved safety and steadiness with mobility in room using RW with no overt LOB noted. Extended time spent collaborating on compensatory/safety strategies for ADLs/IADLs with pt plans on home modification and wheelchair fit assessment in the future to ease mobility/comfort. Discussed safety strategies for stair mgmt (likely opting to scoot up steps with husband assist) and transfer into elevated truck on discharge home. Reinforced exercises and proper positioning of residual limb. Pt appears excited about healing, obtaining realistic prosthetic LE and returning to independence.    Follow Up Recommendations  Home health OT;Supervision/Assistance - 24 hour    Equipment Recommendations  None recommended by OT (well equipped)    Recommendations for Other Services      Precautions / Restrictions Precautions Precautions: Fall Restrictions Weight Bearing Restrictions: Yes RLE Weight Bearing: Non weight bearing       Mobility Bed Mobility               General bed mobility comments: sitting EOB on entry    Transfers Overall transfer level: Needs assistance Equipment used: Rolling walker (2  wheeled) Transfers: Sit to/from Stand Sit to Stand: Supervision         General transfer comment: Supervision for sit to stand from bedside with RW, min guard for mobility to/from door, no LOB noted    Balance Overall balance assessment: Needs assistance Sitting-balance support: Feet supported Sitting balance-Leahy Scale: Good     Standing balance support: Bilateral upper extremity supported Standing balance-Leahy Scale: Poor Standing balance comment: bil UE support to maintain Rt NWB                           ADL either performed or assessed with clinical judgement   ADL Overall ADL's : Needs assistance/impaired                                     Functional mobility during ADLs: Min guard;Rolling walker General ADL Comments: Extended time spent collaborating with pt on ADLs, compensatory strategies, IADLs with use of DME, as well as home modification ideas. Pt engaged throughout with many plans of modifying hinges in doorways, plan for wheelchair fit assessment to maximize mobility with lightweight chair. Discussed elevating footrests to prop up residual limb, gel or air cushion if extended time spent in wheelchair. Reinforced residual limb movement and elevation. Collaborated with pt on managing stairs and car transfer with pt safety conscious and reports husband can assist with these tasks as well     Vision   Vision Assessment?: No apparent visual deficits   Perception     Praxis      Cognition Arousal/Alertness: Awake/alert  Behavior During Therapy: Flat affect;WFL for tasks assessed/performed Overall Cognitive Status: Within Functional Limits for tasks assessed                                 General Comments: initially flat affect and hesistant for therapy awaiting IV pain meds. Once educated on DC of IV meds, pt agreeable to attempt tasks. Pt pleasant, talkative and reports plans of home modifications, DME alterations, etc.  Pt reports fear of falling and noted with safety conscious behaviors already.        Exercises     Shoulder Instructions       General Comments      Pertinent Vitals/ Pain       Pain Assessment: 0-10 Pain Score: 7  Pain Location: R LE Pain Descriptors / Indicators: Grimacing;Guarding Pain Intervention(s): Monitored during session;Premedicated before session  Home Living                                          Prior Functioning/Environment              Frequency  Min 2X/week        Progress Toward Goals  OT Goals(current goals can now be found in the care plan section)  Progress towards OT goals: Progressing toward goals  Acute Rehab OT Goals Patient Stated Goal: go home, get prosthetic LE OT Goal Formulation: With patient Time For Goal Achievement: 06/21/20 Potential to Achieve Goals: Good ADL Goals Pt Will Perform Lower Body Bathing: with set-up;sitting/lateral leans Pt Will Perform Lower Body Dressing: with set-up;sitting/lateral leans Pt Will Transfer to Toilet: with min assist;stand pivot transfer;bedside commode Pt Will Perform Toileting - Clothing Manipulation and hygiene: with set-up;sitting/lateral leans Pt/caregiver will Perform Home Exercise Program: Increased strength;Both right and left upper extremity;Independently;With written HEP provided  Plan Discharge plan remains appropriate    Co-evaluation                 AM-PAC OT "6 Clicks" Daily Activity     Outcome Measure   Help from another person eating meals?: None Help from another person taking care of personal grooming?: A Little Help from another person toileting, which includes using toliet, bedpan, or urinal?: A Little Help from another person bathing (including washing, rinsing, drying)?: A Little Help from another person to put on and taking off regular upper body clothing?: A Little Help from another person to put on and taking off regular lower body  clothing?: A Little 6 Click Score: 19    End of Session Equipment Utilized During Treatment: Rolling walker;Gait belt  OT Visit Diagnosis: Unsteadiness on feet (R26.81);Other abnormalities of gait and mobility (R26.89);Muscle weakness (generalized) (M62.81) Pain - Right/Left: Right Pain - part of body: Ankle and joints of foot   Activity Tolerance Patient tolerated treatment well   Patient Left in bed;with call bell/phone within reach   Nurse Communication Mobility status        Time: 0240-9735 OT Time Calculation (min): 33 min  Charges: OT General Charges $OT Visit: 1 Visit OT Treatments $Self Care/Home Management : 8-22 mins $Therapeutic Activity: 8-22 mins  Malachy Chamber, OTR/L Acute Rehab Services Office: [phone]   Layla Maw 06/14/2020, 12:38 PM

## 2020-06-14 NOTE — Progress Notes (Signed)
At the beginning of this shift BP was 182/105.  Labetalol was given with some improvement.  Now BP is 200/106.  Dr. Clearence Ped notified and an order for hydralazine received.  Will continue to monitor.

## 2020-06-15 ENCOUNTER — Telehealth: Payer: Self-pay | Admitting: Family Medicine

## 2020-06-15 NOTE — Telephone Encounter (Signed)
Transition Care Management Follow-up Telephone Call  Date of discharge and from where: 06/14/2020 from [facility]   How have you been since you were released from the hospital? Patient states she is doing ok  Any questions or concerns? No  Items Reviewed:  Did the pt receive and understand the discharge instructions provided? Yes   Medications obtained and verified? Yes   Other? No   Any new allergies since your discharge? No   Dietary orders reviewed? Yes  Do you have support at home? Yes   Home Care and Equipment/Supplies: Were home health services ordered? yes If so, what is the name of the agency? [facility]   Has the agency set up a time to come to the patient's home? no Were any new equipment or medical supplies ordered?  No What is the name of the medical supply agency? N/A  Were you able to get the supplies/equipment? no Do you have any questions related to the use of the equipment or supplies? No  Functional Questionnaire: (I = Independent and D = Dependent) ADLs: I with assistance   Bathing/Dressing- I with assistance   Meal Prep- D  Eating- I  Maintaining continence- I  Transferring/Ambulation- I with assistance  Managing Meds- I  Follow up appointments reviewed:   PCP Hospital f/u appt confirmed? Yes  Scheduled to see Dr. Jerline Pain on 06/25/2020 @ 8:40 am.  [place] [facility] f/u appt confirmed? No    Are transportation arrangements needed? No   If their condition worsens, is the pt aware to call PCP or go to the Emergency Dept.? Yes  Was the patient provided with contact information for the PCP's office or ED? Yes  Was to pt encouraged to call back with questions or concerns? Yes

## 2020-06-16 ENCOUNTER — Other Ambulatory Visit: Payer: Self-pay | Admitting: Family Medicine

## 2020-06-18 NOTE — Telephone Encounter (Signed)
Requesting refills

## 2020-06-19 ENCOUNTER — Ambulatory Visit: Payer: 59 | Admitting: Internal Medicine

## 2020-06-19 NOTE — Progress Notes (Deleted)
Cardiology Office Note:    Date:  6/62/9476   ID:  [name], DOB [birth year], MRN [id number]  PCP:  [name], MD   [place]  Cardiologist:  No primary care provider on file. *** Saw Dr. Caryl Comes 2015. Advanced Practice Provider:  No care team member to display Electrophysiologist:  None  {Press F2 to show EP APP, CHF, sleep or structural heart MD               :[id number]}  { Click here to update then REFRESH NOTE - MD (PCP) or APP (Team Member)  Change PCP Type for MD, Specialty for APP is either Cardiology or Clinical Cardiac Electrophysiology  :[id number]}   Referring MD: [name], MD   No chief complaint on file. ***  History of Present Illness:    [name] is a 30 y.o. female with a hx of HTN with DM (Type 1), PAD s/p Prior R BKA, Orthostatic Hypotension who presents for evaluation 06/19/20.  Patient notes that (s)he is feeling ***.  Has had no chest pain, chest pressure, chest tightness, chest stinging ***.  Discomfort occurs with ***, worsens with ***, and improves with ***.  Patient exertion notable for *** with *** and feels no symptoms.  No shortness of breath, DOE ***.  No PND or orthopnea***.  No bendopnea***, weight gain***, leg swelling ***, or abdominal swelling***.  No syncope or near syncope ***. Notes *** no palpitations or funny heart beats.     Patient reports prior cardiac testing including *** echo, *** stress test, *** heart catheterizations, *** cardioversion, *** ablations.  No history of ***pre-eclampsia, early menarche, or prematurity.  No Fen-Phen or drug use***.  Ambulatory BP ***.   Past Medical History:  Diagnosis Date  . Allergy   . Anemia   . Anxiety   . Blood transfusion without reported diagnosis    Dec 2018  . Cataract    right eye  . COVID-19 03/2020  . Depression   . Diabetes type 1, uncontrolled ([place]) 11/14/2011   Since age 63  . Fibromyalgia   . Gastroparesis   . GERD  (gastroesophageal reflux disease)   . Hypertension   . Infection    UTI April 2016    Past Surgical History:  Procedure Laterality Date  . AMPUTATION Right 04/13/2020   Procedure: 1ST RAY AMPUTATION RIGHT FOOT;  Surgeon: Newt Minion, MD;  Location: [place];  Service: Orthopedics;  Laterality: Right;  . AMPUTATION Right 06/06/2020   Procedure: RIGHT BELOW KNEE AMPUTATION;  Surgeon: Newt Minion, MD;  Location: [place];  Service: Orthopedics;  Laterality: Right;  . ANKLE SURGERY    . APPLICATION OF WOUND VAC Right 06/06/2020   Procedure: APPLICATION OF WOUND VAC;  Surgeon: Newt Minion, MD;  Location: [facility];  Service: Orthopedics;  Laterality: Right;  . CHOLECYSTECTOMY  11/15/2011   Procedure: LAPAROSCOPIC CHOLECYSTECTOMY WITH INTRAOPERATIVE CHOLANGIOGRAM;  Surgeon: [name], MD;  [facility];  Service: General;  Laterality: N/A;  . COLONOSCOPY    . COLONOSCOPY WITH PROPOFOL N/A 06/27/2017   Procedure: COLONOSCOPY WITH PROPOFOL;  Surgeon: Milus Banister, MD;  [facility];  Service: Endoscopy;  Laterality: N/A;  . ESOPHAGOGASTRODUODENOSCOPY  12/03/2011   Procedure: ESOPHAGOGASTRODUODENOSCOPY (EGD);  Surgeon: Beryle Beams, MD;  [facility];  Service: Endoscopy;  Laterality: N/A;  . FLEXIBLE SIGMOIDOSCOPY N/A 03/10/2017   Procedure: FLEXIBLE SIGMOIDOSCOPY;  Surgeon: Carol Ada, MD;  [facility];  Service: Endoscopy;  Laterality: N/A;  . INCISION AND DRAINAGE PERIRECTAL ABSCESS N/A 03/01/2017   Procedure: IRRIGATION AND DEBRIDEMENT PERIRECTAL ABSCESS;  Surgeon: Alphonsa Overall, MD;  [facility];  Service: General;  Laterality: N/A;  . IRRIGATION AND DEBRIDEMENT BUTTOCKS N/A 03/23/2017   Procedure: IRRIGATION AND DEBRIDEMENT BUTTOCKS, SETON PLACEMENT;  Surgeon: [name], MD;  [facility];  Service: General;  Laterality: N/A;  . LAPAROSCOPY  11/23/2011   Procedure: LAPAROSCOPY DIAGNOSTIC;  Surgeon: Edward Jolly, MD;  [facility];  Service: General;  Laterality: N/A;  . SIGMOIDOSCOPY    . UPPER GASTROINTESTINAL ENDOSCOPY    . WISDOM TOOTH EXTRACTION      Current Medications: No outpatient medications have been marked as taking for the 06/19/20 encounter (Appointment) with Werner Lean, MD.     Allergies:   Other and Lactose intolerance (gi)   Social History   Socioeconomic History  . Marital status: Married    Spouse name: sergio  . Number of children: 0  . Years of education: college  . Highest education level: Not on file  Occupational History  . Occupation: [employer] call center    Employer: [employer]  Tobacco Use  . Smoking status: Never Smoker  . Smokeless tobacco: Never Used  Vaping Use  . Vaping Use: Never used  Substance and Sexual Activity  . Alcohol use: No    Alcohol/week: 0.0 standard drinks  . Drug use: Yes    Types: Marijuana  . Sexual activity: Not Currently    Partners: Male    Birth control/protection: None  Other Topics Concern  . Not on file  Social History Narrative  . Not on file   Social Determinants of Health   Financial Resource Strain: Not on file  Food Insecurity: Food Insecurity Present  . Worried About Charity fundraiser in the Last Year: Sometimes true  . Ran Out of Food in the Last Year: Sometimes true  Transportation Needs: No Transportation Needs  . Lack of Transportation (Medical): No  . Lack of Transportation (Non-Medical): No  Physical Activity: Not on file  Stress: Not on file  Social Connections: Not on file     Family History: The patient's ***family history includes Breast cancer in her paternal aunt; Colon cancer in her paternal grandmother; Diabetes in her maternal grandfather, maternal grandmother, mother, paternal grandfather, paternal grandmother, and another family member; Hypertension in her father. There is no history of Esophageal cancer, Liver cancer, Pancreatic cancer, Stomach cancer, or Rectal cancer.  ROS:    Please see the history of present illness.    *** All other systems reviewed and are negative.  EKGs/Labs/Other Studies Reviewed:    The following studies were reviewed today:  EKG:   06/11/20: sinus tachycardia rate 101  Transthoracic Echocardiogram: Date: 05/16/2020 Results: IVC 1.8 and collapsable 1. Left ventricular ejection fraction, by estimation, is 55 to 60%. The  left ventricle has normal function. The left ventricle has no regional  wall motion abnormalities. There is mild left ventricular hypertrophy.  Left ventricular diastolic parameters  are consistent with Grade II diastolic dysfunction (pseudonormalization).  Elevated left atrial pressure.  2. Right ventricular systolic function is normal. The right ventricular  size is normal. There is mildly elevated pulmonary artery systolic  pressure.  3. The mitral valve is normal in structure. No evidence of mitral valve  regurgitation.  4. The aortic valve is tricuspid. Aortic valve regurgitation is not  visualized. No  aortic stenosis is present.   NonCardiac CT: Date: 05/16/20 Results: No aortic atherosclerosis or coronary artery calcium Prominent R sided pleural effusion (multi-lobar)    Recent Labs: 06/04/2020: B Natriuretic Peptide 444.7 06/10/2020: ALT 26 06/14/2020: BUN 25; Creatinine, Ser 1.14; Hemoglobin 8.8; Magnesium 1.8; Platelets 306; Potassium 4.0; Sodium 134  Recent Lipid Panel    Component Value Date/Time   CHOL 133 01/26/2009 0000   TRIG 570 (H) 05/17/2020 0500   HDL 57 01/26/2009 0000   CHOLHDL 4.5 09/01/2008 0640   VLDL 70 (H) 09/01/2008 0640   LDLCALC 62 01/26/2009 0000    Risk Assessment/Calculations:    N/A  Physical Exam:    VS:  There were no vitals taken for this visit.    Wt Readings from Last 3 Encounters:  06/14/20 156 lb (70.8 kg)  05/28/20 186 lb (84.4 kg)  05/19/20 178 lb 5.6 oz (80.9 kg)     GEN: *** Well nourished, well developed in no acute distress HEENT:  Normal NECK: No JVD; No carotid bruits LYMPHATICS: No lymphadenopathy CARDIAC: ***RRR, no murmurs, rubs, gallops RESPIRATORY:  Clear to auscultation without rales, wheezing or rhonchi  ABDOMEN: Soft, non-tender, non-distended MUSCULOSKELETAL:  No edema; No deformity  SKIN: Warm and dry NEUROLOGIC:  Alert and oriented x 3 PSYCHIATRIC:  Normal affect   ASSESSMENT:    No diagnosis found. PLAN:    In order of problems listed above:  Syncope   {Are you ordering a CV Procedure (e.g. stress test, cath, DCCV, TEE, etc)?   Press F2        :[id number]}    Medication Adjustments/Labs and Tests Ordered: Current medicines are reviewed at length with the patient today.  Concerns regarding medicines are outlined above.  No orders of the defined types were placed in this encounter.  No orders of the defined types were placed in this encounter.   There are no Patient Instructions on file for this visit.   Signed, Werner Lean, MD  06/19/2020 1:25 PM    [facility]

## 2020-06-20 ENCOUNTER — Telehealth: Payer: Self-pay

## 2020-06-20 NOTE — Telephone Encounter (Signed)
[name] is calling in from [facility] to inform us that the patients blood pressure was 186/98, not symptomatic with it.

## 2020-06-21 NOTE — Telephone Encounter (Signed)
Spoke with [name], continue monitor BP if persistently elevated or develops symptoms Let us know [name] teach hands hygiene to patient before cleaning wound to minimize risk of infection.

## 2020-06-21 NOTE — Telephone Encounter (Signed)
Noted. Would like for them to continue to monitor and let us know if persistently elevated or if she develops symptoms.  Katina Degree. Jimmey Ralph, MD 06/21/2020 10:08 AM

## 2020-06-21 NOTE — Telephone Encounter (Signed)
See note

## 2020-06-22 ENCOUNTER — Encounter: Payer: Self-pay | Admitting: Family

## 2020-06-22 ENCOUNTER — Ambulatory Visit: Payer: 59 | Admitting: Psychology

## 2020-06-22 ENCOUNTER — Ambulatory Visit (INDEPENDENT_AMBULATORY_CARE_PROVIDER_SITE_OTHER): Payer: 59 | Admitting: Family

## 2020-06-22 DIAGNOSIS — S88111A Complete traumatic amputation at level between knee and ankle, right lower leg, initial encounter: Secondary | ICD-10-CM

## 2020-06-22 DIAGNOSIS — Z89511 Acquired absence of right leg below knee: Secondary | ICD-10-CM

## 2020-06-22 MED ORDER — OXYCODONE HCL 5 MG PO TABS: 5.0000 mg | ORAL_TABLET | Freq: Three times a day (TID) | ORAL | 0 refills | Status: DC | PRN

## 2020-06-22 NOTE — Progress Notes (Signed)
Post-Op Visit Note   Patient: [name]           Date of Birth: 02-14-1991           MRN: [id number] Visit Date: 06/22/2020 PCP: [name], MD  Chief Complaint:  Chief Complaint  Patient presents with  . Right Leg - Routine Post Op    06/06/20 right BKA     HPI:  HPI The patient is a 30 year old woman seen status post right below-knee amputation.  She is 2 weeks out.  Staples are in place.  She has been wearing her shrinker daily  Ortho Exam Her incision is healing well staples harvested today without incident there residual limb has moderate edema there is no erythema no drainage no sign of infection  Visit Diagnoses:  1. Below-knee amputation of right lower extremity ([place])     Plan: Given an order for her prosthesis set up.  She will continue daily Dial soap cleansing.  Shrinker with direct skin contact.  Staples were harvested.  She will follow-up once more in 2 weeks.  Follow-Up Instructions: Return in about 2 weeks (around 07/06/2020).   Imaging: No results found.  Orders:  No orders of the defined types were placed in this encounter.  Meds ordered this encounter  Medications  . oxyCODONE (ROXICODONE) 5 MG immediate release tablet    Sig: Take 1 tablet (5 mg total) by mouth 3 (three) times daily as needed for severe pain.    Dispense:  21 tablet    Refill:  0     PMFS History: Patient Active Problem List   Diagnosis Date Noted  . 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[place])   . Hypoxemic respiratory failure, chronic ([place])   . Abnormal CT of the chest   . Foot osteomyelitis, right ([place]) 06/04/2020  . GI bleeding 05/12/2020  . Iron deficiency 04/11/2020  . Wound infection 04/10/2020  . Osteomyelitis ([place]) 04/10/2020  . Diabetic foot ulcer ([place]) 04/09/2020  . 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 Cannabinoid hyperemesis syndrome 04/30/2019  . Contraception management 08/06/2018  . Cyclical vomiting syndrome 06/07/2018  . Diabetic  gastroparesis associated with type 1 diabetes mellitus ([place]) 06/04/2018  . Anxiety 05/31/2018  . Peripheral edema 05/31/2018  . Chronic anemia 07/10/2017  . GERD (gastroesophageal reflux disease) 06/23/2017  . Intractable nausea and vomiting 04/11/2017  . MDD (major depressive disorder), recurrent episode, mild ([place]) 02/28/2017  . Diabetes mellitus type 1 ([place]) 04/28/2013  . Severe protein-calorie malnutrition ([place]) 04/28/2013  . 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 Hypertension associated with diabetes ([place]) 11/14/2011  . Chronic abdominal pain 09/18/2011   Past Medical History:  Diagnosis Date  . Allergy   . Anemia   . Anxiety   . Blood transfusion without reported diagnosis    Dec 2018  . Cataract    right eye  . COVID-19 03/2020  . Depression   . Diabetes type 1, uncontrolled ([place]) 11/14/2011   Since age 89  . Fibromyalgia   . Gastroparesis   . GERD (gastroesophageal reflux disease)   . Hypertension   . Infection    UTI April 2016    Family History  Problem Relation Age of Onset  . Diabetes Mother   . Hypertension Father   . Colon cancer Paternal Grandmother        pt thinks PGM was dx in her 63's  . Diabetes Paternal Grandmother   . Diabetes  Maternal Grandmother   . Diabetes Maternal Grandfather   . Diabetes Paternal Grandfather   . Diabetes Other   . Breast cancer Paternal Aunt   . Esophageal cancer Neg Hx   . Liver cancer Neg Hx   . Pancreatic cancer Neg Hx   . Stomach cancer Neg Hx   . Rectal cancer Neg Hx     Past Surgical History:  Procedure Laterality Date  . AMPUTATION Right 04/13/2020   Procedure: 1ST RAY AMPUTATION RIGHT FOOT;  Surgeon: Newt Minion, MD;  Location: [place];  Service: Orthopedics;  Laterality: Right;  . AMPUTATION Right 06/06/2020   Procedure: RIGHT BELOW KNEE AMPUTATION;  Surgeon: Newt Minion, MD;  Location: [place];  Service: Orthopedics;  Laterality: Right;  . ANKLE SURGERY    .  APPLICATION OF WOUND VAC Right 06/06/2020   Procedure: APPLICATION OF WOUND VAC;  Surgeon: Newt Minion, MD;  Location: [place];  Service: Orthopedics;  Laterality: Right;  . CHOLECYSTECTOMY  11/15/2011   Procedure: LAPAROSCOPIC CHOLECYSTECTOMY WITH INTRAOPERATIVE CHOLANGIOGRAM;  Surgeon: [name], MD;  [facility];  Service: General;  Laterality: N/A;  . COLONOSCOPY    . COLONOSCOPY WITH PROPOFOL N/A 06/27/2017   Procedure: COLONOSCOPY WITH PROPOFOL;  Surgeon: Milus Banister, MD;  [facility];  Service: Endoscopy;  Laterality: N/A;  . ESOPHAGOGASTRODUODENOSCOPY  12/03/2011   Procedure: ESOPHAGOGASTRODUODENOSCOPY (EGD);  Surgeon: Beryle Beams, MD;  [facility];  Service: Endoscopy;  Laterality: N/A;  . FLEXIBLE SIGMOIDOSCOPY N/A 03/10/2017   Procedure: FLEXIBLE SIGMOIDOSCOPY;  Surgeon: Carol Ada, MD;  [facility];  Service: Endoscopy;  Laterality: N/A;  . INCISION AND DRAINAGE PERIRECTAL ABSCESS N/A 03/01/2017   Procedure: IRRIGATION AND DEBRIDEMENT PERIRECTAL ABSCESS;  Surgeon: Alphonsa Overall, MD;  [facility];  Service: General;  Laterality: N/A;  . IRRIGATION AND DEBRIDEMENT BUTTOCKS N/A 03/23/2017   Procedure: IRRIGATION AND DEBRIDEMENT BUTTOCKS, SETON PLACEMENT;  Surgeon: [name], MD;  [facility];  Service: General;  Laterality: N/A;  . LAPAROSCOPY  11/23/2011   Procedure: LAPAROSCOPY DIAGNOSTIC;  Surgeon: Edward Jolly, MD;  [facility];  Service: General;  Laterality: N/A;  . SIGMOIDOSCOPY    . UPPER GASTROINTESTINAL ENDOSCOPY    . WISDOM TOOTH EXTRACTION     Social History   Occupational History  . Occupation: [employer] call center    Employer: [employer]  Tobacco Use  . Smoking status: Never Smoker  . Smokeless tobacco: Never Used  Vaping Use  . Vaping Use: Never used  Substance and Sexual Activity  . Alcohol use: No    Alcohol/week: 0.0 standard drinks  . Drug use: Yes    Types: Marijuana   . Sexual activity: Not Currently    Partners: Male    Birth control/protection: None

## 2020-06-25 ENCOUNTER — Inpatient Hospital Stay: Payer: 59 | Admitting: Family Medicine

## 2020-06-27 ENCOUNTER — Ambulatory Visit (INDEPENDENT_AMBULATORY_CARE_PROVIDER_SITE_OTHER): Payer: 59

## 2020-06-27 ENCOUNTER — Telehealth: Payer: Self-pay | Admitting: Family Medicine

## 2020-06-27 ENCOUNTER — Other Ambulatory Visit: Payer: 59

## 2020-06-27 ENCOUNTER — Ambulatory Visit (INDEPENDENT_AMBULATORY_CARE_PROVIDER_SITE_OTHER): Payer: 59 | Admitting: Family Medicine

## 2020-06-27 ENCOUNTER — Other Ambulatory Visit: Payer: Self-pay

## 2020-06-27 ENCOUNTER — Encounter: Payer: Self-pay | Admitting: Family Medicine

## 2020-06-27 VITALS — BP 131/71 | HR 95 | Temp 98.5°F | Ht 67.0 in | Wt 165.0 lb

## 2020-06-27 DIAGNOSIS — E1059 Type 1 diabetes mellitus with other circulatory complications: Secondary | ICD-10-CM

## 2020-06-27 DIAGNOSIS — E1022 Type 1 diabetes mellitus with diabetic chronic kidney disease: Secondary | ICD-10-CM | POA: Diagnosis not present

## 2020-06-27 DIAGNOSIS — I152 Hypertension secondary to endocrine disorders: Secondary | ICD-10-CM

## 2020-06-27 DIAGNOSIS — Z89511 Acquired absence of right leg below knee: Secondary | ICD-10-CM | POA: Diagnosis not present

## 2020-06-27 DIAGNOSIS — J690 Pneumonitis due to inhalation of food and vomit: Secondary | ICD-10-CM

## 2020-06-27 DIAGNOSIS — E1043 Type 1 diabetes mellitus with diabetic autonomic (poly)neuropathy: Secondary | ICD-10-CM

## 2020-06-27 DIAGNOSIS — E43 Unspecified severe protein-calorie malnutrition: Secondary | ICD-10-CM | POA: Diagnosis not present

## 2020-06-27 DIAGNOSIS — E1159 Type 2 diabetes mellitus with other circulatory complications: Secondary | ICD-10-CM

## 2020-06-27 DIAGNOSIS — R609 Edema, unspecified: Secondary | ICD-10-CM

## 2020-06-27 DIAGNOSIS — N183 Chronic kidney disease, stage 3 unspecified: Secondary | ICD-10-CM | POA: Diagnosis not present

## 2020-06-27 DIAGNOSIS — M86271 Subacute osteomyelitis, right ankle and foot: Secondary | ICD-10-CM

## 2020-06-27 DIAGNOSIS — E1069 Type 1 diabetes mellitus with other specified complication: Secondary | ICD-10-CM

## 2020-06-27 DIAGNOSIS — K3184 Gastroparesis: Secondary | ICD-10-CM

## 2020-06-27 LAB — CBC
HCT: 25.1 % — ABNORMAL LOW (ref 36.0–46.0)
Hemoglobin: 8.3 g/dL — ABNORMAL LOW (ref 12.0–15.0)
MCHC: 32.8 g/dL (ref 30.0–36.0)
MCV: 84.9 fl (ref 78.0–100.0)
Platelets: 250 10*3/uL (ref 150.0–400.0)
RBC: 2.96 Mil/uL — ABNORMAL LOW (ref 3.87–5.11)
RDW: 16.9 % — ABNORMAL HIGH (ref 11.5–15.5)
WBC: 5.6 10*3/uL (ref 4.0–10.5)

## 2020-06-27 LAB — COMPREHENSIVE METABOLIC PANEL
ALT: 49 U/L — ABNORMAL HIGH (ref 0–35)
AST: 38 U/L — ABNORMAL HIGH (ref 0–37)
Albumin: 2.6 g/dL — ABNORMAL LOW (ref 3.5–5.2)
Alkaline Phosphatase: 146 U/L — ABNORMAL HIGH (ref 39–117)
BUN: 43 mg/dL — ABNORMAL HIGH (ref 6–23)
CO2: 24 mEq/L (ref 19–32)
Calcium: 8.4 mg/dL (ref 8.4–10.5)
Chloride: 107 mEq/L (ref 96–112)
Creatinine, Ser: 1.38 mg/dL — ABNORMAL HIGH (ref 0.40–1.20)
GFR: 51.23 mL/min — ABNORMAL LOW (ref >60.00)
Glucose, Bld: 104 mg/dL — ABNORMAL HIGH (ref 70–99)
Potassium: 6.5 mEq/L (ref 3.5–5.1)
Sodium: 134 mEq/L — ABNORMAL LOW (ref 135–145)
Total Bilirubin: 0.2 mg/dL (ref 0.2–1.2)
Total Protein: 6.4 g/dL (ref 6.0–8.3)

## 2020-06-27 LAB — ACID FAST CULTURE WITH REFLEXED SENSITIVITIES (MYCOBACTERIA): Acid Fast Culture: NEGATIVE

## 2020-06-27 IMAGING — DX DG CHEST 2V
2 series · 2 of 2 positions shown · non-contrast
Comparison: [DATE], CT [DATE], radiograph [DATE]

CLINICAL DATA: Shortness of breath

EXAM:
CHEST - 2 VIEW

[chest pa]
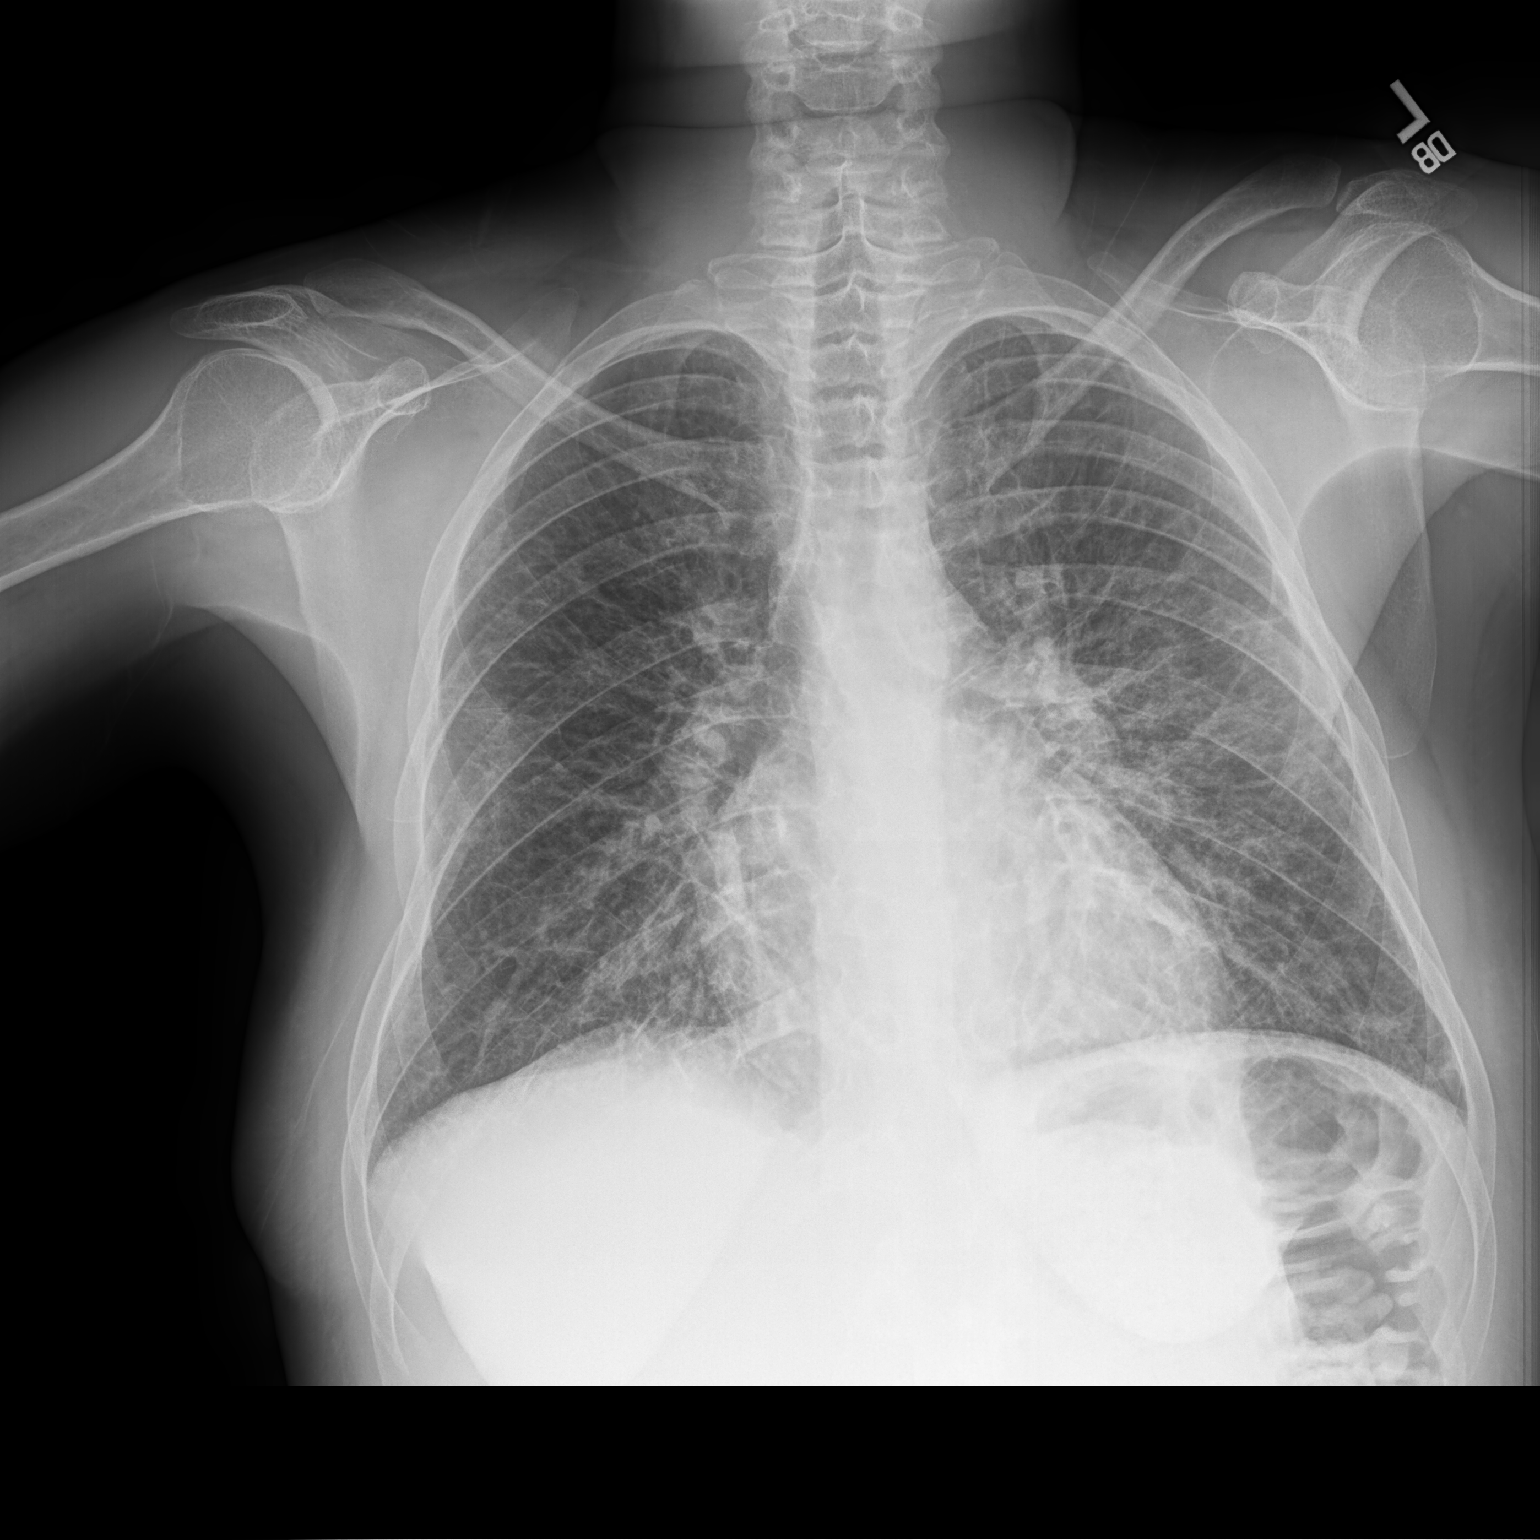

[chest lat]
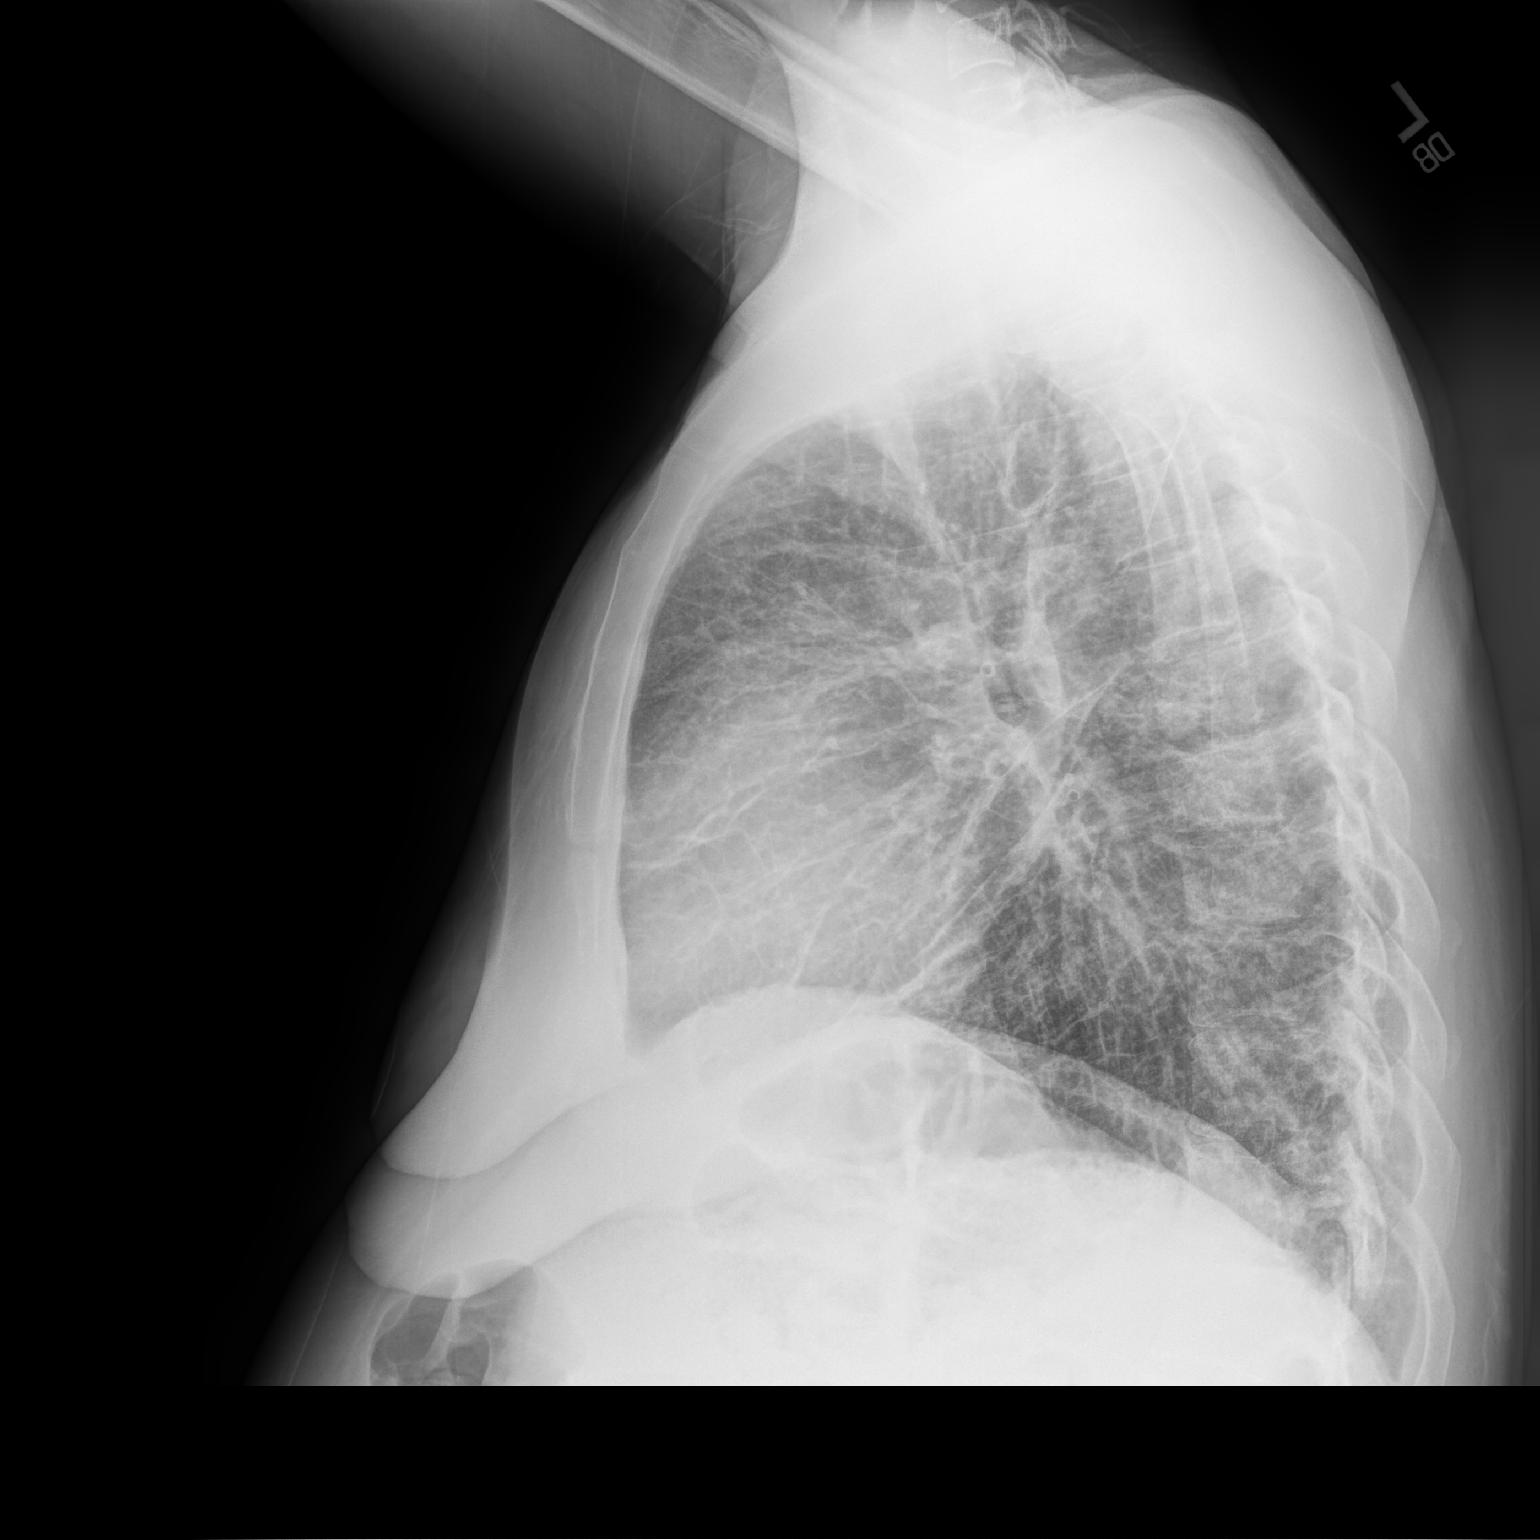

[2 of 2 positions shown; findings below may reference images not displayed]

FINDINGS: Normal heart size. No significant pleural effusion. Diffuse
interstitial opacity without worsening consolidation. Minimal
residual ground-glass opacity within the mid lungs and left base
bilaterally. No pneumothorax.
IMPRESSION: Diffuse interstitial opacity with mild residual ground-glass
opacities in the mid lungs and left base though improved compared to
prior radiograph and potentially reflecting resolving foci of
pneumonia.

## 2020-06-27 MED ORDER — OXYCODONE HCL 5 MG PO TABS: 5.0000 mg | ORAL_TABLET | Freq: Three times a day (TID) | ORAL | 0 refills | Status: DC | PRN

## 2020-06-27 NOTE — Assessment & Plan Note (Signed)
Likely main contributor to her poor wound healing and issues with infection.  Discussed importance of high-protein diet.  She is managing as well as she can still has some ongoing issues with gastroparesis as below.

## 2020-06-27 NOTE — Progress Notes (Signed)
Chief Complaint:  [name] is a 30 y.o. female who presents today for a TCM visit.  Assessment/Plan:  Chronic Problems Addressed Today: Severe protein-calorie malnutrition ([place]) Likely main contributor to [name] poor wound healing and issues with infection.  Discussed importance of high-protein diet.  [name] is managing as well as [name] can still has some ongoing issues with gastroparesis as below.  Hypertension associated with diabetes ([place]) At goal today.  [name] is on amlodipine 10 mg daily and Coreg 12.5 mg twice daily.  Osteomyelitis ([place]) No signs of recurrent infection now status post right BKA.  Will be following up with orthopedics next week to have prosthetic placed.  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  Peripheral edema Well-controlled.  [name] is taking Lasix 40 mg daily as needed.  Diabetes mellitus type 1 ([place]) Sugars have been recently well controlled.  [name] will continue management per endocrinology.    Subjective:  HPI:  Summary of Hospital admission: Reason for admission: Osteomyelitis Date of admission: 06/04/2020 Date of discharge: 06/14/2020 Date of Interactive contact: 06/16/2020 Summary of Hospital course: Patient presented to the ED on 06/08/2020 with worsening pain and swelling.  [name] was found to have right foot osteomyelitis.  Was admitted.  Started on IV antibiotics.  Underwent right BKA on 06/06/2020.  There was concern for HFpEF exacerbation during hospitalization.  [name] was continued on Lasix and had good improvement in fluid status.  [name] was also started on amlodipine during hospitalization and has done well with this.  [name] was discharged home on 06/14/2020.  Interim history:  [name] has done well since discharged.  Still has some pain mostly at night.  Seems to be improving.  [name] has been doing okay with [name]  new medications.  [name] followed up with orthopedics last week has been seeing a wound care nurse in [name] home twice weekly.  [name] has had a bit of wound dehiscence but so far no signs of reinfection.  Sugars have been well controlled.  Has not had any issues with gastroparesis or intractable nausea or vomiting.  [name] will be following up again with orthopedics in a couple of weeks.  See A/P for status of chronic conditions.  ROS: Per HPI, otherwise a complete review of systems was negative.   PMH:  The following were reviewed and entered/updated in epic: Past Medical History:  Diagnosis Date  . Allergy   . Anemia   . Anxiety   . Blood transfusion without reported diagnosis    Dec 2018  . Cataract    right eye  . COVID-19 03/2020  . Depression   . Diabetes type 1, uncontrolled ([place]) 11/14/2011   Since age 33  . Fibromyalgia   . Gastroparesis   . GERD (gastroesophageal reflux disease)   . Hypertension   . Infection    UTI April 2016   Patient Active Problem List   Diagnosis Date Noted  . 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[place])   . Hypoxemic respiratory failure, chronic ([place])   . Abnormal CT of the chest   . GI bleeding 05/12/2020  . Iron deficiency 04/11/2020  . Osteomyelitis ([place]) 04/10/2020  . 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 Cannabinoid hyperemesis syndrome 04/30/2019  . Contraception management 08/06/2018  . Cyclical vomiting syndrome 06/07/2018  . Diabetic gastroparesis associated with type 1 diabetes  mellitus ([place]) 06/04/2018  . Anxiety 05/31/2018  . Peripheral edema 05/31/2018  . Chronic anemia 07/10/2017  . GERD (gastroesophageal reflux disease) 06/23/2017  . MDD (major depressive disorder), recurrent episode, mild ([place]) 02/28/2017  . Diabetes mellitus type 1 ([place]) 04/28/2013  . Severe protein-calorie malnutrition ([place]) 04/28/2013  . 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 Hypertension associated with  diabetes ([place]) 11/14/2011  . Chronic abdominal pain 09/18/2011   Past Surgical History:  Procedure Laterality Date  . AMPUTATION Right 04/13/2020   Procedure: 1ST RAY AMPUTATION RIGHT FOOT;  Surgeon: Newt Minion, MD;  Location: [facility];  Service: Orthopedics;  Laterality: Right;  . AMPUTATION Right 06/06/2020   Procedure: RIGHT BELOW KNEE AMPUTATION;  Surgeon: Newt Minion, MD;  Location: [facility];  Service: Orthopedics;  Laterality: Right;  . ANKLE SURGERY    . APPLICATION OF WOUND VAC Right 06/06/2020   Procedure: APPLICATION OF WOUND VAC;  Surgeon: Newt Minion, MD;  Location: [facility];  Service: Orthopedics;  Laterality: Right;  . CHOLECYSTECTOMY  11/15/2011   Procedure: LAPAROSCOPIC CHOLECYSTECTOMY WITH INTRAOPERATIVE CHOLANGIOGRAM;  Surgeon: [name], MD;  Location: [facility];  Service: General;  Laterality: N/A;  . COLONOSCOPY    . COLONOSCOPY WITH PROPOFOL N/A 06/27/2017   Procedure: COLONOSCOPY WITH PROPOFOL;  Surgeon: Milus Banister, MD;  [facility];  Service: Endoscopy;  Laterality: N/A;  . ESOPHAGOGASTRODUODENOSCOPY  12/03/2011   Procedure: ESOPHAGOGASTRODUODENOSCOPY (EGD);  Surgeon: Beryle Beams, MD;  Location: [facility];  Service: Endoscopy;  Laterality: N/A;  . FLEXIBLE SIGMOIDOSCOPY N/A 03/10/2017   Procedure: FLEXIBLE SIGMOIDOSCOPY;  Surgeon: Carol Ada, MD;  [facility];  Service: Endoscopy;  Laterality: N/A;  . INCISION AND DRAINAGE PERIRECTAL ABSCESS N/A 03/01/2017   Procedure: IRRIGATION AND DEBRIDEMENT PERIRECTAL ABSCESS;  Surgeon: Alphonsa Overall, MD;  [facility];  Service: General;  Laterality: N/A;  . IRRIGATION AND DEBRIDEMENT BUTTOCKS N/A 03/23/2017   Procedure: IRRIGATION AND DEBRIDEMENT BUTTOCKS, SETON PLACEMENT;  Surgeon: [name], MD;  [facility];  Service: General;  Laterality: N/A;  . LAPAROSCOPY  11/23/2011   Procedure: LAPAROSCOPY DIAGNOSTIC;  Surgeon: Edward Jolly, MD;  [facility];  Service: General;   Laterality: N/A;  . SIGMOIDOSCOPY    . UPPER GASTROINTESTINAL ENDOSCOPY    . WISDOM TOOTH EXTRACTION      Family History  Problem Relation Age of Onset  . Diabetes Mother   . Hypertension Father   . Colon cancer Paternal Grandmother        pt thinks PGM was dx in [name] 20's  . Diabetes Paternal Grandmother   . Diabetes Maternal Grandmother   . Diabetes Maternal Grandfather   . Diabetes Paternal Grandfather   . Diabetes Other   . Breast cancer Paternal Aunt   . Esophageal cancer Neg Hx   . Liver cancer Neg Hx   . Pancreatic cancer Neg Hx   . Stomach cancer Neg Hx   . Rectal cancer Neg Hx     Medications- Reconciled discharge and current medications in Epic.  Current Outpatient Medications  Medication Sig Dispense Refill  . amLODipine (NORVASC) 10 MG tablet Take 1 tablet (10 mg total) by mouth daily. 30 tablet 0  . busPIRone (BUSPAR) 5 MG tablet Take 1 tablet (5 mg total) by mouth 3 (three) times daily. 90 tablet 5  . carvedilol (COREG) 12.5 MG tablet Take 1 tablet (12.5 mg total) by mouth 2 (two) times daily with a meal.  60 tablet 2  . Continuous Blood Gluc Sensor (DEXCOM G6 SENSOR) MISC USE AS DIRECTED WITH CONTINUOUS BLOOD GLUCOSE MONITOR. REPLACE EVERY 10 DAYS 3 each 1  . Continuous Blood Gluc Transmit (DEXCOM G6 TRANSMITTER) MISC USE AS DIRECTED TO  MONITOR  GLUCOSE  LEVELS 1 each 0  . cyclobenzaprine (FLEXERIL) 10 MG tablet TAKE 1 TABLET(10 MG) BY MOUTH THREE TIMES DAILY AS NEEDED FOR MUSCLE SPASMS 30 tablet 0  . dicyclomine (BENTYL) 20 MG tablet Take 1 tablet (20 mg total) by mouth every 8 (eight) hours as needed for spasms. 15 tablet 0  . furosemide (LASIX) 40 MG tablet Take 1 tablet (40 mg total) by mouth daily. 90 tablet 1  . insulin glargine (LANTUS) 100 UNIT/ML injection Inject 10 Units into the skin 2 (two) times daily. Morning & bedtime    . insulin lispro (HUMALOG) 100 UNIT/ML injection Inject 0-5 Units into the skin 3 (three) times daily before meals. Sliding scale     . loperamide (IMODIUM) 2 MG capsule Take 6 mg by mouth daily as needed for diarrhea or loose stools.     Marland Kitchen LORazepam (ATIVAN) 0.5 MG tablet TAKE 1 TABLET(0.5 MG) BY MOUTH TWICE DAILY AS NEEDED FOR ANXIETY 60 tablet 5  . metoCLOPramide (REGLAN) 5 MG tablet Take 1 tablet (5 mg total) by mouth in the morning and at bedtime. Please keep your January appt for further refills. Thank you 60 tablet 0  . mirtazapine (REMERON) 30 MG tablet TAKE 1 TABLET BY MOUTH AT  BEDTIME (Patient taking differently: Take 30 mg by mouth at bedtime.) 30 tablet 11  . ondansetron (ZOFRAN ODT) 4 MG disintegrating tablet Take 1 tablet (4 mg total) by mouth every 8 (eight) hours as needed for nausea or vomiting. 20 tablet 0  . pantoprazole (PROTONIX) 40 MG tablet Take 1 tablet (40 mg total) by mouth 2 (two) times daily. 180 tablet 2  . silver sulfADIAZINE (SILVADENE) 1 % cream Apply 1 application topically daily. Apply small amount to foot wound daily cover with dry dressing 50 g 0  . oxyCODONE (ROXICODONE) 5 MG immediate release tablet Take 1 tablet (5 mg total) by mouth 3 (three) times daily as needed for severe pain. 30 tablet 0   No current facility-administered medications for this visit.    Allergies-reviewed and updated Allergies  Allergen Reactions  . Other Anaphylaxis    Reaction to [place] nuts   . Lactose Intolerance ([place]) Diarrhea    Social History   Socioeconomic History  . Marital status: Married    Spouse name: sergio  . Number of children: 0  . Years of education: college  . Highest education level: Not on file  Occupational History  . Occupation: [employer] call center    Employer: [employer]  Tobacco Use  . Smoking status: Never Smoker  . Smokeless tobacco: Never Used  Vaping Use  . Vaping Use: Never used  Substance and Sexual Activity  . Alcohol use: No    Alcohol/week: 0.0 standard drinks  . Drug use: Yes    Types: Marijuana  . Sexual activity: Not Currently    Partners:  Male    Birth control/protection: None  Other Topics Concern  . Not on file  Social History Narrative  . Not on file   Social Determinants of Health   Financial Resource Strain: Not on file  Food Insecurity: Food Insecurity Present  . Worried About Charity fundraiser in the Last Year: Sometimes true  . Ran  Out of Food in the Last Year: Sometimes true  Transportation Needs: No Transportation Needs  . Lack of Transportation (Medical): No  . Lack of Transportation (Non-Medical): No  Physical Activity: Not on file  Stress: Not on file  Social Connections: Not on file        Objective:  Physical Exam: BP 131/71   Pulse 95   Temp 98.5 F (36.9 C) (Temporal)   Ht 5' 7"  (1.702 m)   Wt 165 lb (74.8 kg)   SpO2 98%   BMI 25.84 kg/m   Gen: NAD, resting comfortably CV: RRR with no murmurs appreciated Pulm: NWOB, CTAB with no crackles, wheezes, or rhonchi GI: Normal bowel sounds present. Soft, Nontender, Nondistended. MSK: No edema, cyanosis, or clubbing noted Skin: Warm, dry Neuro: Grossly normal, moves all extremities Psych: Normal affect and thought content  Time Spent: 45 minutes of total time was spent on the date of the encounter performing the following actions: chart review prior to seeing the patient including recent hospitalization, obtaining history, performing a medically necessary exam, counseling on the treatment plan, placing orders, and documenting in our EHR.       Algis Greenhouse. Jerline Pain, MD 06/27/2020 11:51 AM

## 2020-06-27 NOTE — Telephone Encounter (Signed)
CRITICAL VALUE STICKER  CRITICAL VALUE:Potassium 6.5  RECEIVER (on-site recipient of call):Artasia Thang  DATE & TIME NOTIFIED: 06/27/2020 3:00 pm  MESSENGER (representative from lab): Critical potassium  MD NOTIFIED: Jerline Pain, caleb  TIME OF NOTIFICATION:3:05 pm  RESPONSE:

## 2020-06-27 NOTE — Assessment & Plan Note (Signed)
Check labs today.

## 2020-06-27 NOTE — Assessment & Plan Note (Signed)
Sugars have been recently well controlled.  She will continue management per endocrinology.

## 2020-06-27 NOTE — Assessment & Plan Note (Signed)
Thankfully no recurrent flares since being discharged.

## 2020-06-27 NOTE — Assessment & Plan Note (Signed)
Well-controlled.  She is taking Lasix 40 mg daily as needed.

## 2020-06-27 NOTE — Patient Instructions (Signed)
It was very nice to see you today!  We will check blood work and an x-ray today.  I will refill your pain meds.  Take care, Dr Jerline Pain  PLEASE NOTE:  If you had any lab tests please let us know if you have not heard back within a few days. You may see your results on mychart before we have a chance to review them but we will give you a call once they are reviewed by [place]. If we ordered any referrals today, please let us know if you have not heard from their office within the next week.   Please try these tips to maintain a healthy lifestyle:   Eat at least 3 REAL meals and 1-2 snacks per day.  Aim for no more than 5 hours between eating.  If you eat breakfast, please do so within one hour of getting up.    Each meal should contain half fruits/vegetables, one quarter protein, and one quarter carbs (no bigger than a computer mouse)   Cut down on sweet beverages. This includes juice, soda, and sweet tea.     Drink at least 1 glass of water with each meal and aim for at least 8 glasses per day   Exercise at least 150 minutes every week.

## 2020-06-27 NOTE — Assessment & Plan Note (Signed)
At goal today.  She is on amlodipine 10 mg daily and Coreg 12.5 mg twice daily.

## 2020-06-27 NOTE — Assessment & Plan Note (Signed)
No signs of recurrent infection now status post right BKA.  Will be following up with orthopedics next week to have prosthetic placed.

## 2020-06-28 ENCOUNTER — Other Ambulatory Visit: Payer: Self-pay

## 2020-06-28 ENCOUNTER — Telehealth: Payer: Self-pay | Admitting: Family Medicine

## 2020-06-28 ENCOUNTER — Encounter (HOSPITAL_COMMUNITY): Payer: Self-pay

## 2020-06-28 ENCOUNTER — Other Ambulatory Visit: Payer: Self-pay | Admitting: *Deleted

## 2020-06-28 ENCOUNTER — Other Ambulatory Visit (INDEPENDENT_AMBULATORY_CARE_PROVIDER_SITE_OTHER): Payer: 59

## 2020-06-28 ENCOUNTER — Emergency Department (HOSPITAL_COMMUNITY)
Admission: EM | Admit: 2020-06-28 | Discharge: 2020-06-28 | Disposition: A | Discharge status: Home / Self Care | Payer: 59 | Attending: Emergency Medicine | Admitting: Emergency Medicine

## 2020-06-28 DIAGNOSIS — Z794 Long term (current) use of insulin: Secondary | ICD-10-CM | POA: Insufficient documentation

## 2020-06-28 DIAGNOSIS — Z8616 Personal history of COVID-19: Secondary | ICD-10-CM | POA: Insufficient documentation

## 2020-06-28 DIAGNOSIS — I509 Heart failure, unspecified: Secondary | ICD-10-CM | POA: Diagnosis not present

## 2020-06-28 DIAGNOSIS — Z79899 Other long term (current) drug therapy: Secondary | ICD-10-CM | POA: Diagnosis not present

## 2020-06-28 DIAGNOSIS — R7989 Other specified abnormal findings of blood chemistry: Secondary | ICD-10-CM

## 2020-06-28 DIAGNOSIS — E875 Hyperkalemia: Secondary | ICD-10-CM | POA: Insufficient documentation

## 2020-06-28 DIAGNOSIS — R944 Abnormal results of kidney function studies: Secondary | ICD-10-CM | POA: Diagnosis not present

## 2020-06-28 DIAGNOSIS — N183 Chronic kidney disease, stage 3 unspecified: Secondary | ICD-10-CM | POA: Diagnosis not present

## 2020-06-28 DIAGNOSIS — E1022 Type 1 diabetes mellitus with diabetic chronic kidney disease: Secondary | ICD-10-CM | POA: Diagnosis not present

## 2020-06-28 DIAGNOSIS — E1043 Type 1 diabetes mellitus with diabetic autonomic (poly)neuropathy: Secondary | ICD-10-CM | POA: Diagnosis not present

## 2020-06-28 DIAGNOSIS — R799 Abnormal finding of blood chemistry, unspecified: Secondary | ICD-10-CM | POA: Diagnosis present

## 2020-06-28 DIAGNOSIS — I130 Hypertensive heart and chronic kidney disease with heart failure and stage 1 through stage 4 chronic kidney disease, or unspecified chronic kidney disease: Secondary | ICD-10-CM | POA: Diagnosis not present

## 2020-06-28 LAB — BASIC METABOLIC PANEL
Anion gap: 5 (ref 5–15)
BUN: 42 mg/dL — ABNORMAL HIGH (ref 6–23)
BUN: 42 mg/dL — ABNORMAL HIGH (ref 6–20)
CO2: 23 mEq/L (ref 19–32)
CO2: 19 mmol/L — ABNORMAL LOW (ref 22–32)
Calcium: 8.6 mg/dL (ref 8.4–10.5)
Calcium: 8.8 mg/dL — ABNORMAL LOW (ref 8.9–10.3)
Chloride: 108 mEq/L (ref 96–112)
Chloride: 111 mmol/L (ref 98–111)
Creatinine, Ser: 2.01 mg/dL — ABNORMAL HIGH (ref 0.40–1.20)
Creatinine, Ser: 1.77 mg/dL — ABNORMAL HIGH (ref 0.44–1.00)
GFR, Estimated: 39 mL/min — ABNORMAL LOW (ref >60)
GFR: 32.62 mL/min — ABNORMAL LOW (ref >60.00)
Glucose, Bld: 208 mg/dL — ABNORMAL HIGH (ref 70–99)
Glucose, Bld: 139 mg/dL — ABNORMAL HIGH (ref 70–99)
Potassium: 6.1 mEq/L (ref 3.5–5.1)
Potassium: 5.3 mmol/L — ABNORMAL HIGH (ref 3.5–5.1)
Sodium: 134 mEq/L — ABNORMAL LOW (ref 135–145)
Sodium: 135 mmol/L (ref 135–145)

## 2020-06-28 LAB — COMPREHENSIVE METABOLIC PANEL
ALT: 39 U/L (ref 0–44)
AST: 33 U/L (ref 15–41)
Albumin: 2.2 g/dL — ABNORMAL LOW (ref 3.5–5.0)
Alkaline Phosphatase: 134 U/L — ABNORMAL HIGH (ref 38–126)
Anion gap: 4 — ABNORMAL LOW (ref 5–15)
BUN: 42 mg/dL — ABNORMAL HIGH (ref 6–20)
CO2: 20 mmol/L — ABNORMAL LOW (ref 22–32)
Calcium: 8.5 mg/dL — ABNORMAL LOW (ref 8.9–10.3)
Chloride: 111 mmol/L (ref 98–111)
Creatinine, Ser: 1.66 mg/dL — ABNORMAL HIGH (ref 0.44–1.00)
GFR, Estimated: 42 mL/min — ABNORMAL LOW (ref >60)
Glucose, Bld: 196 mg/dL — ABNORMAL HIGH (ref 70–99)
Potassium: 5.9 mmol/L — ABNORMAL HIGH (ref 3.5–5.1)
Sodium: 135 mmol/L (ref 135–145)
Total Bilirubin: 0.7 mg/dL (ref 0.3–1.2)
Total Protein: 6.7 g/dL (ref 6.5–8.1)

## 2020-06-28 LAB — CBC WITH DIFFERENTIAL/PLATELET
Abs Immature Granulocytes: 0.02 10*3/uL (ref 0.00–0.07)
Basophils Absolute: 0 10*3/uL (ref 0.0–0.1)
Basophils Relative: 1 %
Eosinophils Absolute: 0.2 10*3/uL (ref 0.0–0.5)
Eosinophils Relative: 4 %
HCT: 26.4 % — ABNORMAL LOW (ref 36.0–46.0)
Hemoglobin: 8 g/dL — ABNORMAL LOW (ref 12.0–15.0)
Immature Granulocytes: 0 %
Lymphocytes Relative: 20 %
Lymphs Abs: 1.2 10*3/uL (ref 0.7–4.0)
MCH: 27.8 pg (ref 26.0–34.0)
MCHC: 30.3 g/dL (ref 30.0–36.0)
MCV: 91.7 fL (ref 80.0–100.0)
Monocytes Absolute: 0.5 10*3/uL (ref 0.1–1.0)
Monocytes Relative: 9 %
Neutro Abs: 3.9 10*3/uL (ref 1.7–7.7)
Neutrophils Relative %: 66 %
Platelets: 221 10*3/uL (ref 150–400)
RBC: 2.88 MIL/uL — ABNORMAL LOW (ref 3.87–5.11)
RDW: 15.8 % — ABNORMAL HIGH (ref 11.5–15.5)
WBC: 5.9 10*3/uL (ref 4.0–10.5)
nRBC: 0 % (ref 0.0–0.2)

## 2020-06-28 LAB — MAGNESIUM: Magnesium: 2 mg/dL (ref 1.7–2.4)

## 2020-06-28 LAB — CBG MONITORING, ED: Glucose-Capillary: 165 mg/dL — ABNORMAL HIGH (ref 70–99)

## 2020-06-28 MED ORDER — FUROSEMIDE 10 MG/ML IJ SOLN
40.0000 mg | Freq: Once | INTRAMUSCULAR | Status: AC
  Administered 2020-06-28: 40 mg via INTRAVENOUS
  Filled 2020-06-28: qty 4

## 2020-06-28 MED ORDER — SODIUM ZIRCONIUM CYCLOSILICATE 10 G PO PACK
10.0000 g | PACK | Freq: Once | ORAL | Status: AC
  Administered 2020-06-28: 10 g via ORAL
  Filled 2020-06-28: qty 1

## 2020-06-28 MED ORDER — SODIUM CHLORIDE 0.9 % IV SOLN: Freq: Once | INTRAVENOUS | Status: AC

## 2020-06-28 NOTE — Progress Notes (Signed)
Please inform patient of the following:  Her potassium is very high. I think this could be a lab error but we need to get this rechecked ASAP. Recommend she go to ED or urgent care this morning.  She can come here for STAT BMET if she does not want to go to the ED or urgent care.  Algis Greenhouse. Jerline Pain, MD 06/28/2020 8:08 AM

## 2020-06-28 NOTE — Progress Notes (Signed)
Please inform patient of the following:  Potassium is still up and her kidney function has decreased. Recommend she go to ED ASAP.  Algis Greenhouse. Jerline Pain, MD 06/28/2020 12:57 PM

## 2020-06-28 NOTE — Telephone Encounter (Signed)
CRITICAL VALUE STICKER  CRITICAL VALUE: Potassium 6.1  RECEIVER (on-site recipient of call): [name]  DATE & TIME NOTIFIED: 12:15pm 06/28/2020  MESSENGER (representative from lab):Hope Scales  MD NOTIFIED: Dimas Chyle  TIME OF NOTIFICATION: 12:17 pm  RESPONSE:

## 2020-06-28 NOTE — Discharge Instructions (Signed)
Call your nephrologist tomorrow morning to set up a follow-up appointment for recheck of your potassium and kidney function in the next 2 days. Return to the emergency room if you develop palpitations, chest pain, shortness of breath.  Return with any new, worsening, concerning symptoms.

## 2020-06-28 NOTE — ED Triage Notes (Signed)
Patient reports that she was called by her PCP today because of abnormal labs. K=-6.5, elevated BUN and creatine, and Hgb- 8.3

## 2020-06-28 NOTE — ED Provider Notes (Signed)
30 year old female received at signout from [facility] pending repeat metabolic panel.  Per her HPI:  "[name] is a 30 y.o. female presenting for evaluation of abnormal labs.   Patient states she was sent here by her primary care doctor due to elevated potassium levels.  She reports no symptoms.  She was having a regular hospital follow-up with her PCP.  She denies chest pain, shortness of breath, weakness, muscle cramps.  She states she was recently started on amlodipine, has had no other medication changes.  She denies change in p.o. intake.  Additional history obtained from chart review.  Per chart review, patient with a history of hypertension, anxiety, fibromyalgia, depression, gastroparesis, type 1 diabetes, GERD, CHF.  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  At that time she was also hyperkalemic"  On my evaluation, patient adamantly denies chest pain, shortness of breath, weakness, muscle cramps.  She has no complaints at this time.  She is concerned that her soft diet has consisted mostly of beans, which may have contributed to her elevated potassium levels.  Physical Exam  BP (!) 147/93   Pulse (!) 103   Temp 98.4 F (36.9 C) (Oral)   Resp 13   Ht 5' 7"  (1.702 m)   Wt 74.8 kg   SpO2 99%   BMI 25.84 kg/m   Physical Exam Vitals and nursing note reviewed.  Constitutional:      Appearance: She is well-developed.     Comments: Sitting comfortably in a wheelchair. NAD.  HENT:     Head: Normocephalic and atraumatic.  Cardiovascular:     Rate and Rhythm: Normal rate.  Pulmonary:     Effort: Pulmonary effort is normal.  Musculoskeletal:        General: Normal range of motion.     Cervical back: Normal range of motion.  Neurological:     General: No focal deficit present.     Mental Status: She is alert and oriented to person, place, and time.     ED Course/Procedures     Procedures  MDM  30 year old female received  a signout from [facility] pending repeat BMP.  Please see her note for further work-up and medical decision making.  In brief, patient presented for abnormal outpatient labs.  Per chart review, patient was hyperkalemic at 6.1 with a Cr of 2.0 earlier today when labs were checked.  Initially, creatinine was 1.66 with a potassium of 5.9.  She was given Lokelma and IV fluids and repeat metabolic panel was 1.06 with good improvement of potassium to 5.3.   At bedside, I updated the patient on repeat labs.  I did, again, offer the patient admission as there was no improvement in her creatinine or bicarb.  However, creatinine is stable from previous labs.  Patient adamantly declines admission.  She has no chest pain, shortness of breath, muscle cramps, or weakness.  She adamantly states that she will follow up with her nephrologist tomorrow.   At this time, the patient is hemodynamically stable and in no acute distress.  Safe for discharge to home with very close outpatient follow-up.       Joanne Gavel, PA-C 06/28/20 [id number]    Dorie Rank, MD 06/29/20 [id number]

## 2020-06-28 NOTE — ED Provider Notes (Signed)
[facility] DEPT Provider Note   CSN: [id number] Arrival date & time: 06/28/20  1629     History Chief Complaint  Patient presents with  . abnormal labs    [name] is a 30 y.o. female presenting for evaluation of abnormal labs.   Patient states she was sent here by her primary care doctor due to elevated potassium levels.  She reports no symptoms.  She was having a regular hospital follow-up with her PCP.  She denies chest pain, shortness of breath, weakness, muscle cramps.  She states she was recently started on amlodipine, has had no other medication changes.  She denies change in p.o. intake.  Additional history obtained from chart review.  Per chart review, patient with a history of hypertension, anxiety, fibromyalgia, depression, gastroparesis, type 1 diabetes, GERD, CHF.  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  At that time she was also hyperkalemic    HPI     Past Medical History:  Diagnosis Date  . Allergy   . Anemia   . Anxiety   . Blood transfusion without reported diagnosis    Dec 2018  . Cataract    right eye  . COVID-19 03/2020  . Depression   . Diabetes type 1, uncontrolled ([place]) 11/14/2011   Since age 57  . Fibromyalgia   . Gastroparesis   . GERD (gastroesophageal reflux disease)   . Hypertension   . Infection    UTI April 2016    Patient Active Problem List   Diagnosis Date Noted  . 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 Hypoxemic respiratory failure, chronic ([place])   . Abnormal CT of the chest   . GI bleeding 05/12/2020  . Iron deficiency 04/11/2020  . Osteomyelitis ([place]) 04/10/2020  . 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 Cannabinoid hyperemesis syndrome 04/30/2019  . Contraception management 08/06/2018  . Cyclical vomiting syndrome 06/07/2018  . Diabetic gastroparesis associated with type 1 diabetes mellitus ([place]) 06/04/2018  . Anxiety  05/31/2018  . Peripheral edema 05/31/2018  . Chronic anemia 07/10/2017  . GERD (gastroesophageal reflux disease) 06/23/2017  . MDD (major depressive disorder), recurrent episode, mild ([place]) 02/28/2017  . Diabetes mellitus type 1 ([place]) 04/28/2013  . Severe protein-calorie malnutrition ([place]) 04/28/2013  . 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 Hypertension associated with diabetes ([place]) 11/14/2011  . Chronic abdominal pain 09/18/2011    Past Surgical History:  Procedure Laterality Date  . AMPUTATION Right 04/13/2020   Procedure: 1ST RAY AMPUTATION RIGHT FOOT;  Surgeon: Newt Minion, MD;  Location: [facility];  Service: Orthopedics;  Laterality: Right;  . AMPUTATION Right 06/06/2020   Procedure: RIGHT BELOW KNEE AMPUTATION;  Surgeon: Newt Minion, MD;  Location: [facility];  Service: Orthopedics;  Laterality: Right;  . ANKLE SURGERY    . APPLICATION OF WOUND VAC Right 06/06/2020   Procedure: APPLICATION OF WOUND VAC;  Surgeon: Newt Minion, MD;  Location: [facility];  Service: Orthopedics;  Laterality: Right;  . CHOLECYSTECTOMY  11/15/2011   Procedure: LAPAROSCOPIC CHOLECYSTECTOMY WITH INTRAOPERATIVE CHOLANGIOGRAM;  Surgeon: [name], MD;  [facility];  Service: General;  Laterality: N/A;  . COLONOSCOPY    . COLONOSCOPY WITH PROPOFOL N/A 06/27/2017   Procedure: COLONOSCOPY WITH PROPOFOL;  Surgeon: Milus Banister, MD;  [facility];  Service: Endoscopy;  Laterality: N/A;  . ESOPHAGOGASTRODUODENOSCOPY  12/03/2011   Procedure: ESOPHAGOGASTRODUODENOSCOPY (EGD);  Surgeon: Beryle Beams, MD;  [facility];  Service: Endoscopy;  Laterality: N/A;  . FLEXIBLE SIGMOIDOSCOPY N/A 03/10/2017   Procedure: FLEXIBLE SIGMOIDOSCOPY;  Surgeon: Carol Ada, MD;  [facility];  Service: Endoscopy;  Laterality: N/A;  . INCISION AND DRAINAGE PERIRECTAL ABSCESS N/A 03/01/2017   Procedure: IRRIGATION AND DEBRIDEMENT PERIRECTAL ABSCESS;  Surgeon:  Alphonsa Overall, MD;  [facility];  Service: General;  Laterality: N/A;  . IRRIGATION AND DEBRIDEMENT BUTTOCKS N/A 03/23/2017   Procedure: IRRIGATION AND DEBRIDEMENT BUTTOCKS, SETON PLACEMENT;  Surgeon: [name], MD;  [facility];  Service: General;  Laterality: N/A;  . LAPAROSCOPY  11/23/2011   Procedure: LAPAROSCOPY DIAGNOSTIC;  Surgeon: Edward Jolly, MD;  [facility];  Service: General;  Laterality: N/A;  . SIGMOIDOSCOPY    . UPPER GASTROINTESTINAL ENDOSCOPY    . WISDOM TOOTH EXTRACTION       OB History    Gravida  1   Para  1   Term      Preterm  1   AB      Living  1     SAB      IAB      Ectopic      Multiple  0   Live Births  1           Family History  Problem Relation Age of Onset  . Diabetes Mother   . Hypertension Father   . Colon cancer Paternal Grandmother        pt thinks PGM was dx in her 33's  . Diabetes Paternal Grandmother   . Diabetes Maternal Grandmother   . Diabetes Maternal Grandfather   . Diabetes Paternal Grandfather   . Diabetes Other   . Breast cancer Paternal Aunt   . Esophageal cancer Neg Hx   . Liver cancer Neg Hx   . Pancreatic cancer Neg Hx   . Stomach cancer Neg Hx   . Rectal cancer Neg Hx     Social History   Tobacco Use  . Smoking status: Never Smoker  . Smokeless tobacco: Never Used  Vaping Use  . Vaping Use: Never used  Substance Use Topics  . Alcohol use: No    Alcohol/week: 0.0 standard drinks  . Drug use: Yes    Types: Marijuana    Home Medications Prior to Admission medications   Medication Sig Start Date End Date Taking? Authorizing Provider  amLODipine (NORVASC) 10 MG tablet Take 1 tablet (10 mg total) by mouth daily. 06/15/20   Thurnell Lose, MD  busPIRone (BUSPAR) 5 MG tablet Take 1 tablet (5 mg total) by mouth 3 (three) times daily. 02/03/20   [name], MD  carvedilol (COREG) 12.5 MG tablet Take 1 tablet (12.5 mg total) by mouth 2 (two) times daily with a meal.  04/16/20 07/15/20  Dahal, Marlowe Aschoff, MD  Continuous Blood Gluc Sensor (DEXCOM G6 SENSOR) MISC USE AS DIRECTED WITH CONTINUOUS BLOOD GLUCOSE MONITOR. REPLACE EVERY 10 DAYS 02/27/20   Renato Shin, MD  Continuous Blood Gluc Transmit (DEXCOM G6 TRANSMITTER) MISC USE AS DIRECTED TO  MONITOR  GLUCOSE  LEVELS 05/10/20   Renato Shin, MD  cyclobenzaprine (FLEXERIL) 10 MG tablet TAKE 1 TABLET(10 MG) BY MOUTH THREE TIMES DAILY AS NEEDED FOR MUSCLE SPASMS 06/14/20   [name], MD  dicyclomine (BENTYL) 20 MG tablet Take 1 tablet (20 mg total) by mouth every 8 (eight) hours as needed for spasms. 11/25/19   Petrucelli, Glynda Jaeger, PA-C  furosemide (LASIX) 40 MG tablet Take 1 tablet (40 mg total) by mouth daily. 05/24/20 11/20/20  Mercy Riding, MD  insulin glargine (LANTUS) 100 UNIT/ML injection Inject 10 Units into the skin 2 (two) times daily. Morning & bedtime    [provider]  insulin lispro (HUMALOG) 100 UNIT/ML injection Inject 0-5 Units into the skin 3 (three) times daily before meals. Sliding scale    [provider]  loperamide (IMODIUM) 2 MG capsule Take 6 mg by mouth daily as needed for diarrhea or loose stools.     [provider]  LORazepam (ATIVAN) 0.5 MG tablet TAKE 1 TABLET(0.5 MG) BY MOUTH TWICE DAILY AS NEEDED FOR ANXIETY 06/18/20   [name], MD  metoCLOPramide (REGLAN) 5 MG tablet Take 1 tablet (5 mg total) by mouth in the morning and at bedtime. Please keep your January appt for further refills. Thank you 03/28/20   Armbruster, Carlota Raspberry, MD  mirtazapine (REMERON) 30 MG tablet TAKE 1 TABLET BY MOUTH AT  BEDTIME Patient taking differently: Take 30 mg by mouth at bedtime. 01/17/20   [name], MD  ondansetron (ZOFRAN ODT) 4 MG disintegrating tablet Take 1 tablet (4 mg total) by mouth every 8 (eight) hours as needed for nausea or vomiting. 06/14/20   Thurnell Lose, MD  oxyCODONE (ROXICODONE) 5 MG immediate release tablet Take 1 tablet (5 mg total) by mouth 3  (three) times daily as needed for severe pain. 06/27/20   [name], MD  pantoprazole (PROTONIX) 40 MG tablet Take 1 tablet (40 mg total) by mouth 2 (two) times daily. 12/06/19   Armbruster, Carlota Raspberry, MD  silver sulfADIAZINE (SILVADENE) 1 % cream Apply 1 application topically daily. Apply small amount to foot wound daily cover with dry dressing 05/24/20   Persons, Bevely Palmer, PA  potassium chloride SA (KLOR-CON) 20 MEQ tablet Take 1 tablet (20 mEq total) by mouth daily for 5 doses. 07/04/19 07/30/19  Janeece Fitting, PA-C    Allergies    Other and Lactose intolerance (gi)  Review of Systems   Review of Systems  All other systems reviewed and are negative.   Physical Exam Updated Vital Signs BP (!) 141/96   Pulse 100   Temp 98.3 F (36.8 C) (Oral)   Resp (!) 22   Ht 5' 7"  (1.702 m)   Wt 74.8 kg   SpO2 98%   BMI 25.84 kg/m   Physical Exam Vitals and nursing note reviewed.  Constitutional:      General: She is not in acute distress.    Appearance: She is well-developed.     Comments: Resting in the bed in NAD  HENT:     Head: Normocephalic and atraumatic.  Eyes:     Extraocular Movements: Extraocular movements intact.     Conjunctiva/sclera: Conjunctivae normal.     Pupils: Pupils are equal, round, and reactive to light.  Cardiovascular:     Rate and Rhythm: Normal rate and regular rhythm.     Pulses: Normal pulses.  Pulmonary:     Effort: Pulmonary effort is normal. No respiratory distress.     Breath sounds: Normal breath sounds. No wheezing.  Abdominal:     General: There is no distension.     Palpations: Abdomen is soft. There is no mass.     Tenderness: There is no abdominal tenderness. There is no guarding or rebound.  Musculoskeletal:        General: Normal range of motion.  Cervical back: Normal range of motion and neck supple.     Comments: R BKA healing well with 1 small area of dehiscence. No ttp, erythema or purulent drainage. LLE with 2+pitting edema,  improved per pt.   Skin:    General: Skin is warm and dry.     Capillary Refill: Capillary refill takes less than 2 seconds.  Neurological:     Mental Status: She is alert and oriented to person, place, and time.     ED Results / Procedures / Treatments   Labs (all labs ordered are listed, but only abnormal results are displayed) Labs Reviewed  CBC WITH DIFFERENTIAL/PLATELET - Abnormal; Notable for the following components:      Result Value   RBC 2.88 (*)    Hemoglobin 8.0 (*)    HCT 26.4 (*)    RDW 15.8 (*)    All other components within normal limits  COMPREHENSIVE METABOLIC PANEL - Abnormal; Notable for the following components:   Potassium 5.9 (*)    CO2 20 (*)    Glucose, Bld 196 (*)    BUN 42 (*)    Creatinine, Ser 1.66 (*)    Calcium 8.5 (*)    Albumin 2.2 (*)    Alkaline Phosphatase 134 (*)    GFR, Estimated 42 (*)    Anion gap 4 (*)    All other components within normal limits  CBG MONITORING, ED - Abnormal; Notable for the following components:   Glucose-Capillary 165 (*)    All other components within normal limits  MAGNESIUM  BASIC METABOLIC PANEL    EKG EKG Interpretation  Date/Time:  Thursday June 28 2020 17:16:30 EDT Ventricular Rate:  97 PR Interval:  141 QRS Duration: 77 QT Interval:  331 QTC Calculation: 421 R Axis:   19 Text Interpretation: Sinus rhythm No significant change since last tracing Confirmed by Aletta Edouard [id number]) on 06/28/2020 5:44:41 PM   Radiology DG Chest 2 View  Result Date: 06/27/2020 CLINICAL DATA:  Shortness of breath EXAM: CHEST - 2 VIEW COMPARISON:  06/09/2020, CT 06/04/2020, radiograph 05/19/2020 FINDINGS: Normal heart size. No significant pleural effusion. Diffuse interstitial opacity without worsening consolidation. Minimal residual ground-glass opacity within the mid lungs and left base bilaterally. No pneumothorax. IMPRESSION: Diffuse interstitial opacity with mild residual ground-glass opacities in the mid  lungs and left base though improved compared to prior radiograph and potentially reflecting resolving foci of pneumonia. Electronically Signed   By: Donavan Foil M.D.   On: 06/27/2020 22:25    Procedures .Critical Care Performed by: Franchot Heidelberg, PA-C Authorized by: Franchot Heidelberg, PA-C   Critical care provider statement:    Critical care time (minutes):  40   Critical care time was exclusive of:  Separately billable procedures and treating other patients and teaching time   Critical care was necessary to treat or prevent imminent or life-threatening deterioration of the following conditions:  Metabolic crisis   Critical care was time spent personally by me on the following activities:  Blood draw for specimens, development of treatment plan with patient or surrogate, evaluation of patient's response to treatment, examination of patient, obtaining history from patient or surrogate, ordering and performing treatments and interventions, ordering and review of laboratory studies, ordering and review of radiographic studies, pulse oximetry, re-evaluation of patient's condition and review of old charts   I assumed direction of critical care for this patient from another provider in my specialty: no       Medications Ordered in ED  Medications  furosemide (LASIX) injection 40 mg (40 mg Intravenous Given 06/28/20 1833)  sodium zirconium cyclosilicate (LOKELMA) packet 10 g (10 g Oral Given 06/28/20 1833)  0.9 %  sodium chloride infusion ( Intravenous New Bag/Given 06/28/20 1955)    ED Course  I have reviewed the triage vital signs and the nursing notes.  Pertinent labs & imaging results that were available during my care of the patient were reviewed by me and considered in my medical decision making (see chart for details).    MDM Rules/Calculators/A&P                          Pt presenting for evaluation of abnormal labs. On exam, pt appears nontoxic.  Per chart review, patient was  hyperkalemic, creatinine of 4 from baseline.  Baseline anemia.  Will recheck labs, treat for hyperkalemia, check EKG, reassess.  Labs show mild hyperkalemia 5.9.  Creatinine and BUN mildly elevated from baseline, but not consistent with AKI.  Concern for dehydration that will be gentle fluids in the setting of heart failure.  Hemoglobin slightly low, but close to patient's baseline.  EKG without changes.  Will start hyperkalemia treatment.  I offered admission for electrolyte creatinine management, patient declined.  As such, will recheck potassium 4 hours after medications.  If improving, will plan for discharge with close follow-up with [facility] nephrology.  Patient is agreeable to this plan.  Pt signed out to Donavan Foil, PA-C for f/u on BMP. If pt still with concerning hyperkalemia or worsening SCr, consider admission. If improved, plan for d/c.   Final Clinical Impression(s) / ED Diagnoses Final diagnoses:  None    Rx / DC Orders ED Discharge Orders    None       Franchot Heidelberg, PA-C 06/28/20 2223    [name], MD 06/29/20 1031

## 2020-06-29 ENCOUNTER — Telehealth: Payer: Self-pay

## 2020-06-29 NOTE — Telephone Encounter (Signed)
[name] is calling in from [facility] to inform us that [name] missed nursing visit today due to being in the ER last night with high potassium.

## 2020-06-29 NOTE — Progress Notes (Signed)
Please inform patient of the following:  Xray shows that her pneumonia is improving. Do not need to do any further testing at this point.  Algis Greenhouse. Jerline Pain, MD 06/29/2020 8:40 AM

## 2020-06-29 NOTE — Telephone Encounter (Signed)
PCP notified, aware of ED visit

## 2020-07-02 ENCOUNTER — Telehealth: Payer: Self-pay

## 2020-07-02 NOTE — Telephone Encounter (Signed)
Please triage

## 2020-07-02 NOTE — Telephone Encounter (Signed)
[facility] wanted to let Dr. Jerline Pain know that pt.'s blood pressure is 170/90. Pt has been vomiting. Nurse states she does not look well.   [phone]. - Home Health Nurse

## 2020-07-02 NOTE — Telephone Encounter (Signed)
Recommend she be triaged.  Algis Greenhouse. Jerline Pain, MD 07/02/2020 10:42 AM

## 2020-07-02 NOTE — Telephone Encounter (Signed)
Please advise 

## 2020-07-03 NOTE — Telephone Encounter (Signed)
LVM to call office back.

## 2020-07-06 ENCOUNTER — Ambulatory Visit: Payer: 59 | Admitting: Family

## 2020-07-09 ENCOUNTER — Encounter: Payer: Self-pay | Admitting: Family Medicine

## 2020-07-09 LAB — CBC AND DIFFERENTIAL
HCT: 23 — AB (ref 36–46)
Hemoglobin: 8.1 — AB (ref 12.0–16.0)
Platelets: 158 (ref 150–399)
WBC: 6.2

## 2020-07-09 LAB — CBC: RBC: 2.89 — AB (ref 3.87–5.11)

## 2020-07-09 LAB — BASIC METABOLIC PANEL
BUN: 33 — AB (ref 4–21)
CO2: 23 — AB (ref 13–22)
Chloride: 100 (ref 99–108)
Creatinine: 2.9 — AB (ref 0.5–1.1)
Glucose: 188
Potassium: 3.2 — AB (ref 3.4–5.3)
Sodium: 132 — AB (ref 137–147)

## 2020-07-09 LAB — COMPREHENSIVE METABOLIC PANEL
Calcium: 7.5 — AB (ref 8.7–10.7)
GFR calc non Af Amer: 21

## 2020-07-10 LAB — CBC: RBC: 2.89 — AB (ref 3.87–5.11)

## 2020-07-10 LAB — BASIC METABOLIC PANEL
BUN: 27 — AB (ref 4–21)
CO2: 24 — AB (ref 13–22)
Chloride: 99 (ref 99–108)
Creatinine: 2.6 — AB (ref 0.5–1.1)
Glucose: 162
Potassium: 2.9 — AB (ref 3.4–5.3)
Sodium: 135 — AB (ref 137–147)

## 2020-07-10 LAB — CBC AND DIFFERENTIAL
HCT: 24 — AB (ref 36–46)
Hemoglobin: 8 — AB (ref 12.0–16.0)
WBC: 6

## 2020-07-10 LAB — COMPREHENSIVE METABOLIC PANEL
Calcium: 7.1 — AB (ref 8.7–10.7)
GFR calc non Af Amer: 24

## 2020-07-11 ENCOUNTER — Encounter: Payer: Self-pay | Admitting: Family Medicine

## 2020-07-12 ENCOUNTER — Telehealth: Payer: Self-pay

## 2020-07-12 NOTE — Telephone Encounter (Signed)
[name] from [facility] called reporting pts BP was 172/100 today. [name] states she does not know if pt was taking her medications correctly. She walked pt through what medications and when she should be taking them. Pt also just got out of the hospital. Pt is also needing a refill of oxyCODONE (ROXICODONE) 5 MG immediate release tablet. [name] stated pt also needs an epipen and needs a new prescription for the pharmacy. Please advise.

## 2020-07-12 NOTE — Telephone Encounter (Signed)
See note

## 2020-07-16 MED ORDER — EPINEPHRINE 0.3 MG/0.3ML IJ SOAJ: 0.3000 mg | Freq: Once | INTRAMUSCULAR | 0 refills | Status: AC | PRN

## 2020-07-16 MED ORDER — OXYCODONE HCL 5 MG PO TABS: 5.0000 mg | ORAL_TABLET | Freq: Three times a day (TID) | ORAL | 0 refills | Status: DC | PRN

## 2020-07-16 NOTE — Addendum Note (Signed)
Addended by: [name] on: 07/16/2020 12:55 PM   Modules accepted: Orders

## 2020-07-20 ENCOUNTER — Other Ambulatory Visit: Payer: Self-pay

## 2020-07-20 MED ORDER — METOCLOPRAMIDE HCL 5 MG PO TABS: 5.0000 mg | ORAL_TABLET | Freq: Two times a day (BID) | ORAL | 0 refills | Status: DC

## 2020-07-20 NOTE — Progress Notes (Signed)
Refill request for reglan 5 mg. Patient has not been seen since 07-2019. Needs an appointment

## 2020-07-21 ENCOUNTER — Other Ambulatory Visit: Payer: Self-pay | Admitting: Endocrinology

## 2020-07-21 DIAGNOSIS — E1010 Type 1 diabetes mellitus with ketoacidosis without coma: Secondary | ICD-10-CM

## 2020-07-28 ENCOUNTER — Other Ambulatory Visit: Payer: Self-pay

## 2020-07-28 DIAGNOSIS — E1010 Type 1 diabetes mellitus with ketoacidosis without coma: Secondary | ICD-10-CM

## 2020-07-28 MED ORDER — DEXCOM G6 SENSOR MISC: 1 refills | Status: AC

## 2020-08-05 NOTE — Progress Notes (Deleted)
Cardiology Office Note:    Date:  4/0/9811   ID:  [name], DOB [birth year], MRN [id number]  PCP:  [name], MD   [facility] Providers Cardiologist:  None {     Referring MD: [name], MD     History of Present Illness:    [name] is a 30 y.o. female with a hx of depression, fibromyalgia, anxiety, DMI, history of RLE infection s/p BKA  and HTN who was referred by Dr. Jerline Pain for evaluation of syncope.  TTE 05/16/20 with LVEF 55-60%, mild LVH, G2DD, no significant valve disease.  Past Medical History:  Diagnosis Date  . Allergy   . Anemia   . Anxiety   . Blood transfusion without reported diagnosis    Dec 2018  . Cataract    right eye  . COVID-19 03/2020  . Depression   . Diabetes type 1, uncontrolled ([place]) 11/14/2011   Since age 33  . Fibromyalgia   . Gastroparesis   . GERD (gastroesophageal reflux disease)   . Hypertension   . Infection    UTI April 2016    Past Surgical History:  Procedure Laterality Date  . AMPUTATION Right 04/13/2020   Procedure: 1ST RAY AMPUTATION RIGHT FOOT;  Surgeon: Newt Minion, MD;  Location: [facility];  Service: Orthopedics;  Laterality: Right;  . AMPUTATION Right 06/06/2020   Procedure: RIGHT BELOW KNEE AMPUTATION;  Surgeon: Newt Minion, MD;  Location: [employer];  Service: Orthopedics;  Laterality: Right;  . ANKLE SURGERY    . APPLICATION OF WOUND VAC Right 06/06/2020   Procedure: APPLICATION OF WOUND VAC;  Surgeon: Newt Minion, MD;  Location: [facility];  Service: Orthopedics;  Laterality: Right;  . CHOLECYSTECTOMY  11/15/2011   Procedure: LAPAROSCOPIC CHOLECYSTECTOMY WITH INTRAOPERATIVE CHOLANGIOGRAM;  Surgeon: [name], MD;  [facility];  Service: General;  Laterality: N/A;  . COLONOSCOPY    . COLONOSCOPY WITH PROPOFOL N/A 06/27/2017   Procedure: COLONOSCOPY WITH PROPOFOL;  Surgeon: Milus Banister, MD;  [facility];  Service: Endoscopy;  Laterality: N/A;  . ESOPHAGOGASTRODUODENOSCOPY   12/03/2011   Procedure: ESOPHAGOGASTRODUODENOSCOPY (EGD);  Surgeon: Beryle Beams, MD;  [facility];  Service: Endoscopy;  Laterality: N/A;  . FLEXIBLE SIGMOIDOSCOPY N/A 03/10/2017   Procedure: FLEXIBLE SIGMOIDOSCOPY;  Surgeon: Carol Ada, MD;  [facility];  Service: Endoscopy;  Laterality: N/A;  . INCISION AND DRAINAGE PERIRECTAL ABSCESS N/A 03/01/2017   Procedure: IRRIGATION AND DEBRIDEMENT PERIRECTAL ABSCESS;  Surgeon: Alphonsa Overall, MD;  [facility];  Service: General;  Laterality: N/A;  . IRRIGATION AND DEBRIDEMENT BUTTOCKS N/A 03/23/2017   Procedure: IRRIGATION AND DEBRIDEMENT BUTTOCKS, SETON PLACEMENT;  Surgeon: [name], MD;  [facility];  Service: General;  Laterality: N/A;  . LAPAROSCOPY  11/23/2011   Procedure: LAPAROSCOPY DIAGNOSTIC;  Surgeon: Edward Jolly, MD;  [facility];  Service: General;  Laterality: N/A;  . SIGMOIDOSCOPY    . UPPER GASTROINTESTINAL ENDOSCOPY    . WISDOM TOOTH EXTRACTION      Current Medications: No outpatient medications have been marked as taking for the 08/08/20 encounter (Appointment) with Freada Bergeron, MD.     Allergies:   Other and Lactose intolerance (gi)   Social History   Socioeconomic History  . Marital status: Married    Spouse name: sergio  . Number of children: 0  . Years of education: college  . Highest education level: Not on file  Occupational History  .  Occupation: [employer] call center    Employer: [employer]  Tobacco Use  . Smoking status: Never Smoker  . Smokeless tobacco: Never Used  Vaping Use  . Vaping Use: Never used  Substance and Sexual Activity  . Alcohol use: No    Alcohol/week: 0.0 standard drinks  . Drug use: Yes    Types: Marijuana  . Sexual activity: Not Currently    Partners: Male    Birth control/protection: None  Other Topics Concern  . Not on file  Social History Narrative  . Not on file   Social Determinants of Health    Financial Resource Strain: Not on file  Food Insecurity: Food Insecurity Present  . Worried About Charity fundraiser in the Last Year: Sometimes true  . Ran Out of Food in the Last Year: Sometimes true  Transportation Needs: No Transportation Needs  . Lack of Transportation (Medical): No  . Lack of Transportation (Non-Medical): No  Physical Activity: Not on file  Stress: Not on file  Social Connections: Not on file     Family History: The patient's ***family history includes Breast cancer in her paternal aunt; Colon cancer in her paternal grandmother; Diabetes in her maternal grandfather, maternal grandmother, mother, paternal grandfather, paternal grandmother, and another family member; Hypertension in her father. There is no history of Esophageal cancer, Liver cancer, Pancreatic cancer, Stomach cancer, or Rectal cancer.  ROS:   Please see the history of present illness.    *** All other systems reviewed and are negative.  EKGs/Labs/Other Studies Reviewed:    The following studies were reviewed today: TTE 18-May-2020: IMPRESSIONS    1. Left ventricular ejection fraction, by estimation, is 55 to 60%. The  left ventricle has normal function. The left ventricle has no regional  wall motion abnormalities. There is mild left ventricular hypertrophy.  Left ventricular diastolic parameters  are consistent with Grade II diastolic dysfunction (pseudonormalization).  Elevated left atrial pressure.  2. Right ventricular systolic function is normal. The right ventricular  size is normal. There is mildly elevated pulmonary artery systolic  pressure.  3. The mitral valve is normal in structure. No evidence of mitral valve  regurgitation.  4. The aortic valve is tricuspid. Aortic valve regurgitation is not  visualized. No aortic stenosis is present.   EKG:  EKG is *** ordered today.  The ekg ordered today demonstrates ***  Recent Labs: 06/04/2020: B Natriuretic Peptide  444.7 06/28/2020: ALT 39; Magnesium 2.0 07/09/2020: Platelets 158 07/10/2020: BUN 27; Creatinine 2.6; Hemoglobin 8.0; Potassium 2.9; Sodium 135  Recent Lipid Panel    Component Value Date/Time   CHOL 133 01/26/2009 0000   TRIG 570 (H) 05/17/2020 0500   HDL 57 01/26/2009 0000   CHOLHDL 4.5 09/01/2008 0640   VLDL 70 (H) 09/01/2008 0640   LDLCALC 62 01/26/2009 0000     Risk Assessment/Calculations:   {Does this patient have ATRIAL FIBRILLATION?[phone]}   Physical Exam:    VS:  There were no vitals taken for this visit.    Wt Readings from Last 3 Encounters:  06/28/20 165 lb (74.8 kg)  06/27/20 165 lb (74.8 kg)  06/14/20 156 lb (70.8 kg)     GEN: *** Well nourished, well developed in no acute distress HEENT: Normal NECK: No JVD; No carotid bruits LYMPHATICS: No lymphadenopathy CARDIAC: ***RRR, no murmurs, rubs, gallops RESPIRATORY:  Clear to auscultation without rales, wheezing or rhonchi  ABDOMEN: Soft, non-tender, non-distended MUSCULOSKELETAL:  No edema; No deformity  SKIN: Warm and dry  NEUROLOGIC:  Alert and oriented x 3 PSYCHIATRIC:  Normal affect   ASSESSMENT:    No diagnosis found. PLAN:    In order of problems listed above:  #Syncope: TTE with normal LVEF 55-60%, mild LVH, G2DD, no significant valve disease. -Check zio monitor  #Grade II Diastolic Dysfunction: -Continue coreg 12.101m BID -Continue lasix 434mdaily  #HTN: -Continue coreg 12.70m69mID -  #DMI: -Continue insulin    {Are you ordering a CV Procedure (e.g. stress test, cath, DCCV, TEE, etc)?   Press F2        :[id number]} Medication Adjustments/Labs and Tests Ordered: Current medicines are reviewed at length with the patient today.  Concerns regarding medicines are outlined above.  No orders of the defined types were placed in this encounter.  No orders of the defined types were placed in this encounter.   There are no Patient Instructions on file for this visit.    Signed, HeaFreada BergeronD  08/05/2020 6:30 PM    [place]

## 2020-08-08 ENCOUNTER — Ambulatory Visit: Payer: Self-pay | Admitting: Cardiology

## 2020-08-15 ENCOUNTER — Other Ambulatory Visit: Payer: Self-pay | Admitting: Family Medicine

## 2020-08-15 ENCOUNTER — Other Ambulatory Visit: Payer: Self-pay

## 2020-08-15 NOTE — Telephone Encounter (Signed)
Rx request send to PCP  

## 2020-08-15 NOTE — Telephone Encounter (Signed)
MEDICATION: oxyCODONE (ROXICODONE) 5 MG immediate release tablet  PHARMACY: [facility]  Comments:   **Let patient know to contact pharmacy at the end of the day to make sure medication is ready. **  ** Please notify patient to allow 48-72 hours to process**  **Encourage patient to contact the pharmacy for refills or they can request refills through [facility]**

## 2020-08-16 MED ORDER — OXYCODONE HCL 5 MG PO TABS: 5.0000 mg | ORAL_TABLET | Freq: Three times a day (TID) | ORAL | 0 refills | Status: DC | PRN

## 2020-08-29 ENCOUNTER — Telehealth: Payer: Self-pay

## 2020-08-29 NOTE — Telephone Encounter (Signed)
MEDICATION: oxyCODONE (ROXICODONE) 5 MG immediate release tablet  PHARMACY: [facility] [place],  - [address] AT [address]  Comments:   **Let patient know to contact pharmacy at the end of the day to make sure medication is ready. **  ** Please notify patient to allow 48-72 hours to process**  **Encourage patient to contact the pharmacy for refills or they can request refills through [facility]**

## 2020-08-29 NOTE — Telephone Encounter (Signed)
Rx was send to pharmacy on 08/16/2020

## 2020-08-31 MED ORDER — OXYCODONE HCL 5 MG PO TABS: 5.0000 mg | ORAL_TABLET | Freq: Three times a day (TID) | ORAL | 0 refills | Status: DC | PRN

## 2020-08-31 NOTE — Telephone Encounter (Signed)
Pt is out of prescription that was sent on 08/16/2020. Pt needs a new prescription.

## 2020-08-31 NOTE — Addendum Note (Signed)
Addended by: [name] on: 08/31/2020 03:35 PM   Modules accepted: Orders

## 2020-08-31 NOTE — Telephone Encounter (Signed)
Rx request 

## 2020-09-12 ENCOUNTER — Telehealth: Payer: Self-pay

## 2020-09-12 NOTE — Telephone Encounter (Signed)
Patient returned call

## 2020-09-12 NOTE — Telephone Encounter (Signed)
Message sent to Dr. Jerline Pain

## 2020-09-12 NOTE — Telephone Encounter (Signed)
MEDICATION: oxyCODONE (ROXICODONE) 5 MG immediate release tablet   PHARMACY:  [facility] [place], [place] AT [place] Phone:  [phone]  Fax:  [phone]      Comments:   **Let patient know to contact pharmacy at the end of the day to make sure medication is ready. **  ** Please notify patient to allow 48-72 hours to process**  **Encourage patient to contact the pharmacy for refills or they can request refills through [facility]**

## 2020-09-12 NOTE — Telephone Encounter (Signed)
Pt requesting refill on Oxycodone.

## 2020-09-13 ENCOUNTER — Other Ambulatory Visit: Payer: Self-pay | Admitting: Family Medicine

## 2020-09-13 MED ORDER — OXYCODONE HCL 5 MG PO TABS: 5.0000 mg | ORAL_TABLET | Freq: Three times a day (TID) | ORAL | 0 refills | Status: DC | PRN

## 2020-09-13 NOTE — Telephone Encounter (Signed)
Pt notified Rx was sent to pharmacy.

## 2020-09-25 ENCOUNTER — Encounter (HOSPITAL_COMMUNITY): Payer: Self-pay

## 2020-09-25 ENCOUNTER — Emergency Department (HOSPITAL_COMMUNITY)
Admission: EM | Admit: 2020-09-25 | Discharge: 2020-09-26 | Disposition: A | Discharge status: Home / Self Care | Payer: Self-pay | Attending: Emergency Medicine | Admitting: Emergency Medicine

## 2020-09-25 ENCOUNTER — Other Ambulatory Visit: Payer: Self-pay

## 2020-09-25 ENCOUNTER — Emergency Department (HOSPITAL_COMMUNITY): Payer: Self-pay

## 2020-09-25 DIAGNOSIS — Z8616 Personal history of COVID-19: Secondary | ICD-10-CM | POA: Insufficient documentation

## 2020-09-25 DIAGNOSIS — Z79899 Other long term (current) drug therapy: Secondary | ICD-10-CM | POA: Insufficient documentation

## 2020-09-25 DIAGNOSIS — I129 Hypertensive chronic kidney disease with stage 1 through stage 4 chronic kidney disease, or unspecified chronic kidney disease: Secondary | ICD-10-CM | POA: Insufficient documentation

## 2020-09-25 DIAGNOSIS — X31XXXA Exposure to excessive natural cold, initial encounter: Secondary | ICD-10-CM | POA: Insufficient documentation

## 2020-09-25 DIAGNOSIS — E1022 Type 1 diabetes mellitus with diabetic chronic kidney disease: Secondary | ICD-10-CM | POA: Insufficient documentation

## 2020-09-25 DIAGNOSIS — T68XXXA Hypothermia, initial encounter: Secondary | ICD-10-CM

## 2020-09-25 DIAGNOSIS — K3184 Gastroparesis: Secondary | ICD-10-CM | POA: Insufficient documentation

## 2020-09-25 DIAGNOSIS — Z794 Long term (current) use of insulin: Secondary | ICD-10-CM | POA: Insufficient documentation

## 2020-09-25 DIAGNOSIS — R4182 Altered mental status, unspecified: Secondary | ICD-10-CM | POA: Insufficient documentation

## 2020-09-25 DIAGNOSIS — E1042 Type 1 diabetes mellitus with diabetic polyneuropathy: Secondary | ICD-10-CM | POA: Insufficient documentation

## 2020-09-25 DIAGNOSIS — E162 Hypoglycemia, unspecified: Secondary | ICD-10-CM

## 2020-09-25 DIAGNOSIS — E10649 Type 1 diabetes mellitus with hypoglycemia without coma: Secondary | ICD-10-CM | POA: Insufficient documentation

## 2020-09-25 DIAGNOSIS — E1043 Type 1 diabetes mellitus with diabetic autonomic (poly)neuropathy: Secondary | ICD-10-CM | POA: Insufficient documentation

## 2020-09-25 DIAGNOSIS — N183 Chronic kidney disease, stage 3 unspecified: Secondary | ICD-10-CM | POA: Insufficient documentation

## 2020-09-25 DIAGNOSIS — Z20822 Contact with and (suspected) exposure to covid-19: Secondary | ICD-10-CM | POA: Insufficient documentation

## 2020-09-25 LAB — CBC WITH DIFFERENTIAL/PLATELET
Abs Immature Granulocytes: 0.07 10*3/uL (ref 0.00–0.07)
Basophils Absolute: 0.1 10*3/uL (ref 0.0–0.1)
Basophils Relative: 1 %
Eosinophils Absolute: 0.1 10*3/uL (ref 0.0–0.5)
Eosinophils Relative: 2 %
HCT: 32.9 % — ABNORMAL LOW (ref 36.0–46.0)
Hemoglobin: 11.3 g/dL — ABNORMAL LOW (ref 12.0–15.0)
Immature Granulocytes: 1 %
Lymphocytes Relative: 28 %
Lymphs Abs: 1.8 10*3/uL (ref 0.7–4.0)
MCH: 29.1 pg (ref 26.0–34.0)
MCHC: 34.3 g/dL (ref 30.0–36.0)
MCV: 84.8 fL (ref 80.0–100.0)
Monocytes Absolute: 0.2 10*3/uL (ref 0.1–1.0)
Monocytes Relative: 3 %
Neutro Abs: 4 10*3/uL (ref 1.7–7.7)
Neutrophils Relative %: 65 %
Platelets: 340 10*3/uL (ref 150–400)
RBC: 3.88 MIL/uL (ref 3.87–5.11)
RDW: 12.2 % (ref 11.5–15.5)
WBC: 6.2 10*3/uL (ref 4.0–10.5)
nRBC: 0 % (ref 0.0–0.2)

## 2020-09-25 LAB — CBG MONITORING, ED
Glucose-Capillary: 49 mg/dL — ABNORMAL LOW (ref 70–99)
Glucose-Capillary: 59 mg/dL — ABNORMAL LOW (ref 70–99)
Glucose-Capillary: 154 mg/dL — ABNORMAL HIGH (ref 70–99)
Glucose-Capillary: 178 mg/dL — ABNORMAL HIGH (ref 70–99)
Glucose-Capillary: 150 mg/dL — ABNORMAL HIGH (ref 70–99)

## 2020-09-25 LAB — COMPREHENSIVE METABOLIC PANEL
ALT: 21 U/L (ref 0–44)
AST: 24 U/L (ref 15–41)
Albumin: 2.5 g/dL — ABNORMAL LOW (ref 3.5–5.0)
Alkaline Phosphatase: 93 U/L (ref 38–126)
Anion gap: 8 (ref 5–15)
BUN: 24 mg/dL — ABNORMAL HIGH (ref 6–20)
CO2: 29 mmol/L (ref 22–32)
Calcium: 8.5 mg/dL — ABNORMAL LOW (ref 8.9–10.3)
Chloride: 100 mmol/L (ref 98–111)
Creatinine, Ser: 1.22 mg/dL — ABNORMAL HIGH (ref 0.44–1.00)
GFR, Estimated: >60 mL/min (ref >60)
Glucose, Bld: 46 mg/dL — ABNORMAL LOW (ref 70–99)
Potassium: 2.8 mmol/L — ABNORMAL LOW (ref 3.5–5.1)
Sodium: 137 mmol/L (ref 135–145)
Total Bilirubin: 0.4 mg/dL (ref 0.3–1.2)
Total Protein: 6.8 g/dL (ref 6.5–8.1)

## 2020-09-25 LAB — RESP PANEL BY RT-PCR (FLU A&B, COVID) ARPGX2
Influenza A by PCR: NEGATIVE
Influenza B by PCR: NEGATIVE
SARS Coronavirus 2 by RT PCR: NEGATIVE

## 2020-09-25 LAB — LIPASE, BLOOD: Lipase: 20 U/L (ref 11–51)

## 2020-09-25 LAB — I-STAT BETA HCG BLOOD, ED (MC, WL, AP ONLY): I-stat hCG, quantitative: <5 m[IU]/mL (ref <5)

## 2020-09-25 LAB — MAGNESIUM: Magnesium: 1.8 mg/dL (ref 1.7–2.4)

## 2020-09-25 IMAGING — CT CT HEAD W/O CM
3 series · 15 of 47 positions shown, 18 images · non-contrast
Comparison: [DATE]

CLINICAL DATA: Facial trauma

EXAM:
CT HEAD WITHOUT CONTRAST
TECHNIQUE: Contiguous axial images were obtained from the base of the skull
through the vertex without intravenous contrast.

[Series 2: head wo · axial · 0.44mm/px · z∈[-115,+10]mm · 9 of 30 slices shown, 12 images]
[im 3/30  brain]
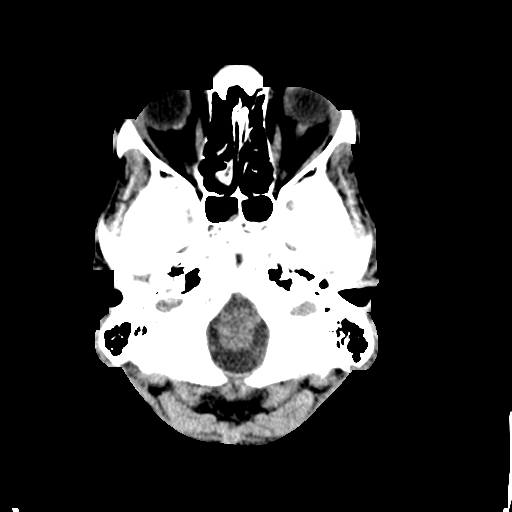
[im 3/30  bone]
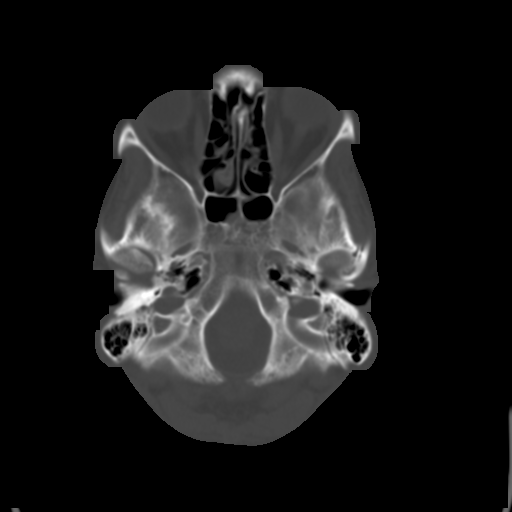
[im 6/30  brain]
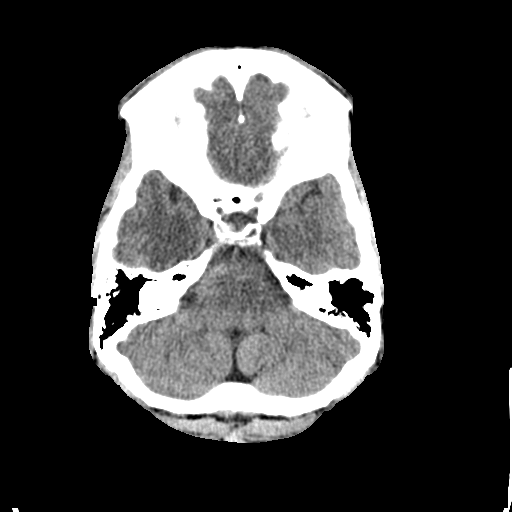
[im 9/30  brain]
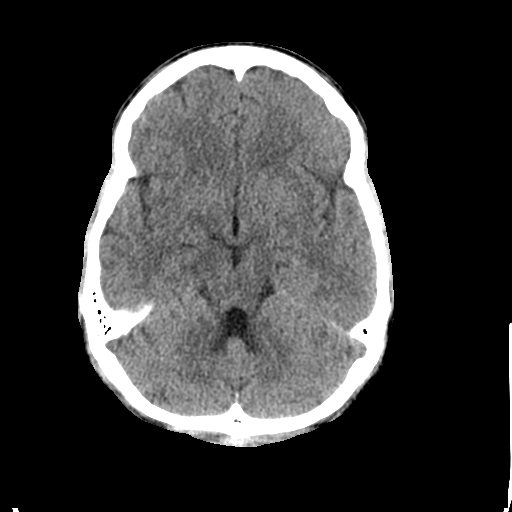
[im 12/30  brain]
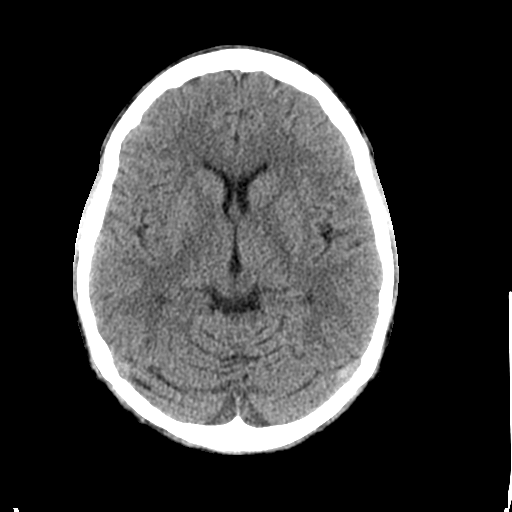
[im 16/30  brain]
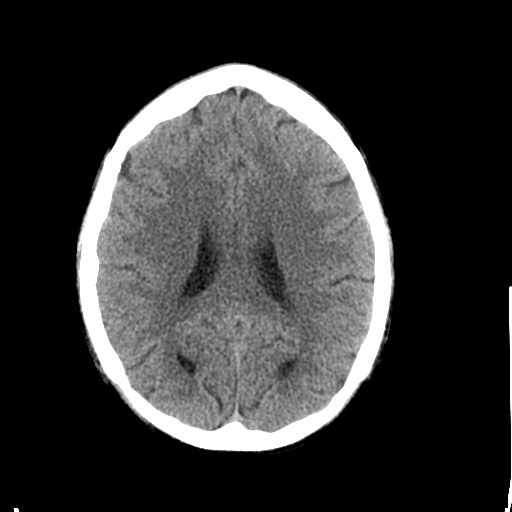
[im 16/30  bone]
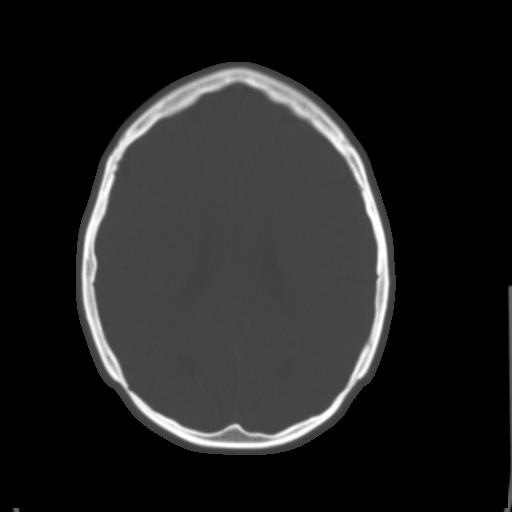
[im 19/30  brain]
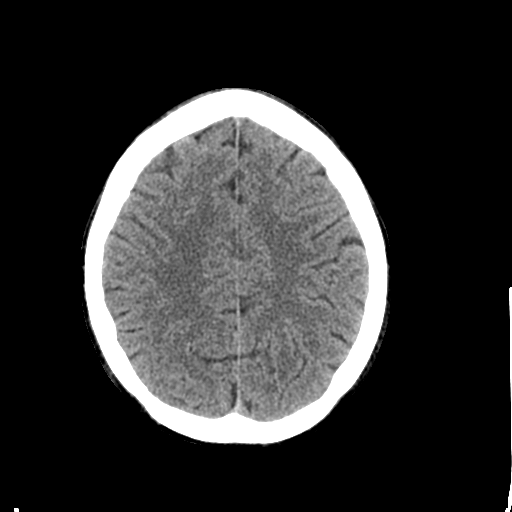
[im 22/30  brain]
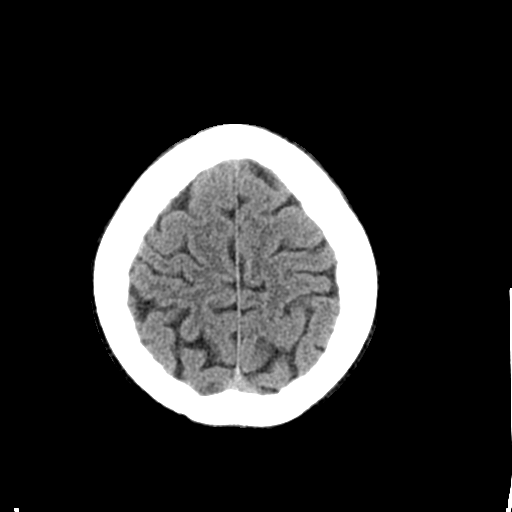
[im 25/30  brain]
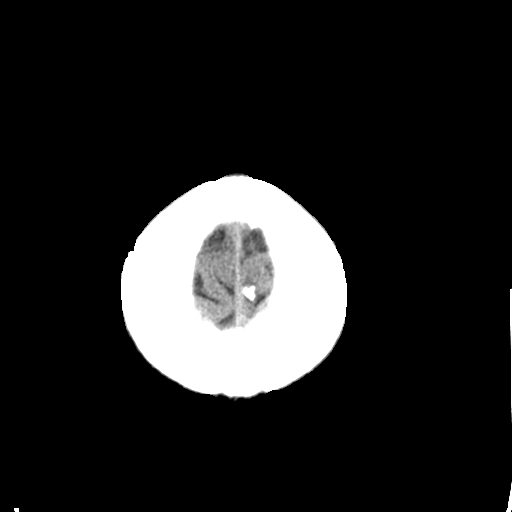
[im 28/30  brain]
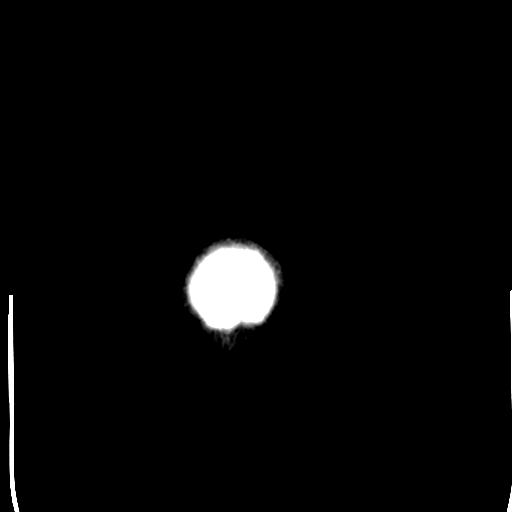
[im 28/30  bone]
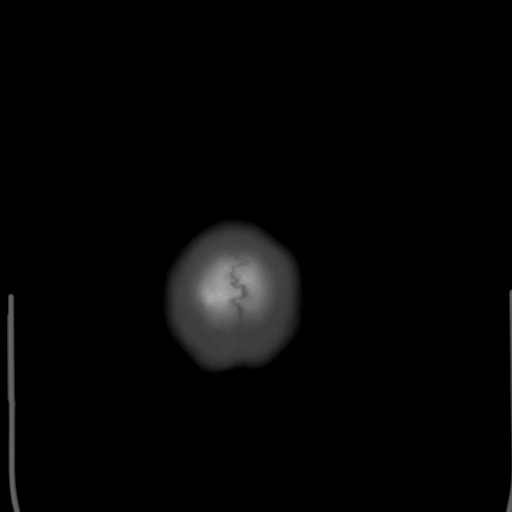

[Series 5: coronal soft tissue · coronal · 0.31mm/px · 3 of 68 slices shown]
[im 23/68  brain]
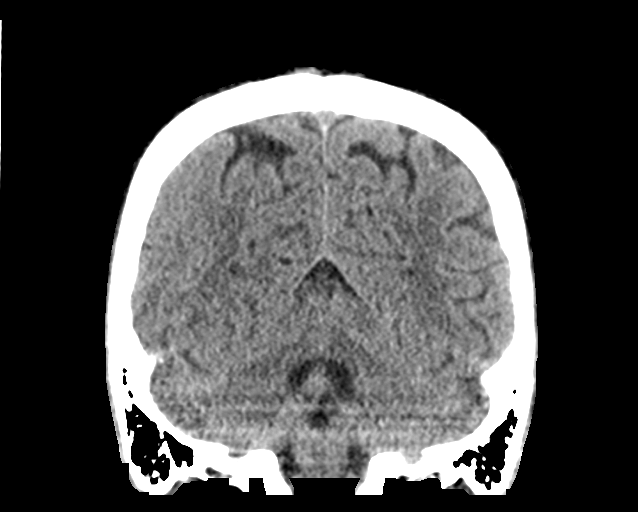
[im 30/68  brain]
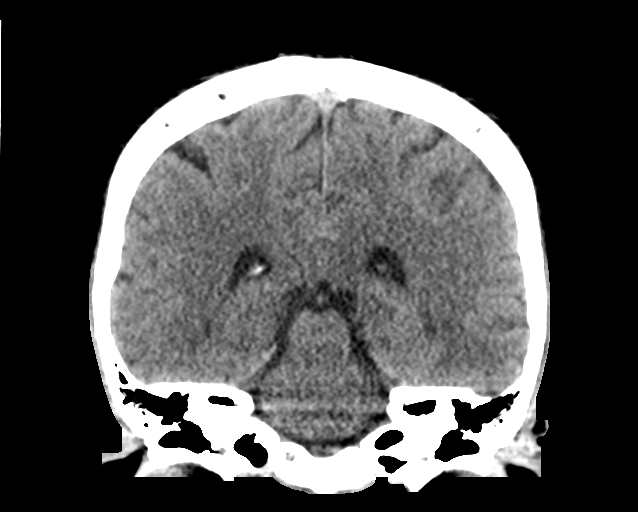
[im 38/68  brain]
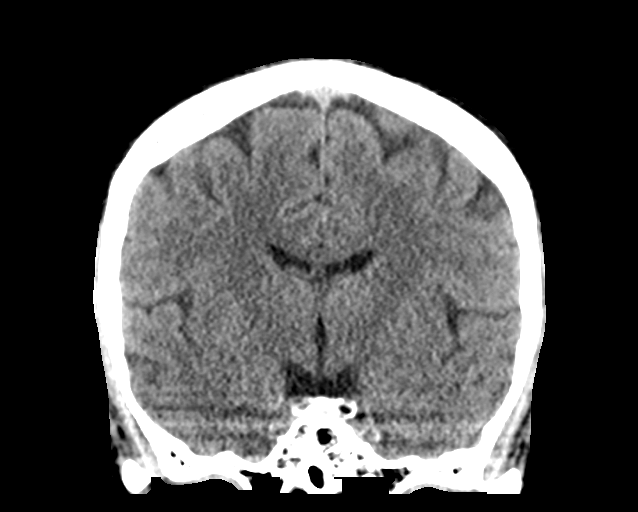

[Series 6: sagittal soft tissue · sagittal · 0.31mm/px · 3 of 51 slices shown]
[im 17/51  brain]
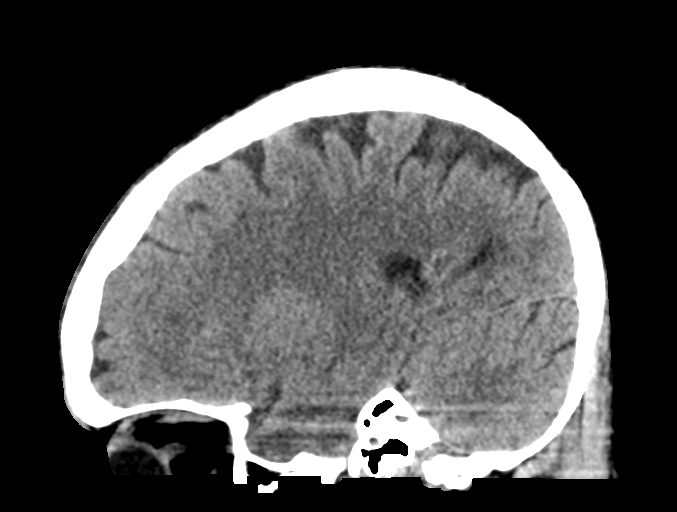
[im 26/51  brain]
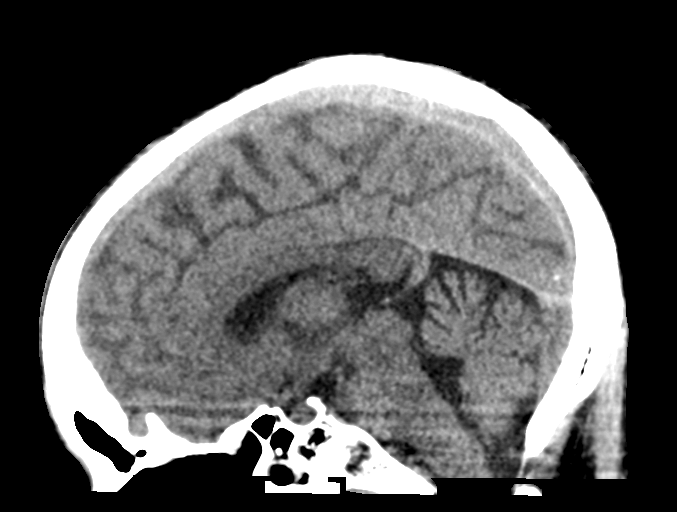
[im 34/51  brain]
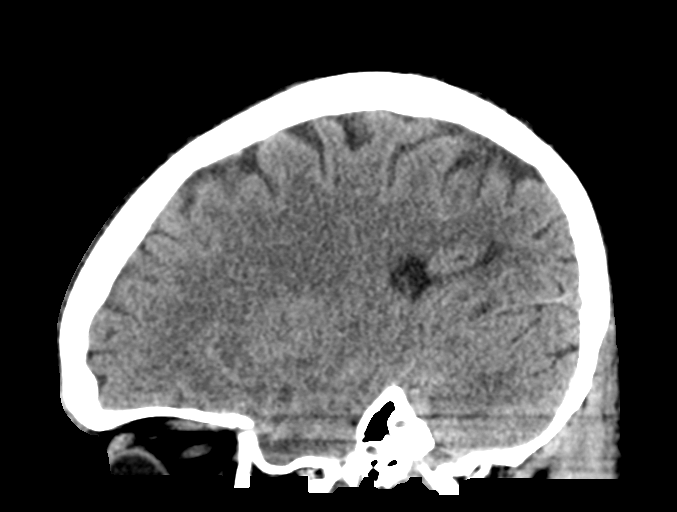

[15 of 47 positions shown; findings below may reference images not displayed]

FINDINGS: Brain: No acute intracranial abnormality. Specifically, no
hemorrhage, hydrocephalus, mass lesion, acute infarction, or
significant intracranial injury.

Vascular: No hyperdense vessel or unexpected calcification.

Skull: No acute calvarial abnormality.

Sinuses/Orbits: Mucosal thickening throughout the ethmoid air cells.

Other: None
IMPRESSION: No acute intracranial abnormality.

Chronic sinusitis.

## 2020-09-25 IMAGING — CT CT ABD-PELV W/O CM
2 of 4 series · 16 of 46 positions shown, 18 images · non-contrast
Comparison: [DATE]

CLINICAL DATA: Abdominal pain

EXAM:
CT ABDOMEN AND PELVIS WITHOUT CONTRAST
TECHNIQUE: Multidetector CT imaging of the abdomen and pelvis was performed
following the standard protocol without IV contrast.

[Series 2: axial st · axial · 0.71mm/px · z∈[-517,-67]mm · 13 of 102 slices shown, 15 images]
[im 6/102  soft-tissue]
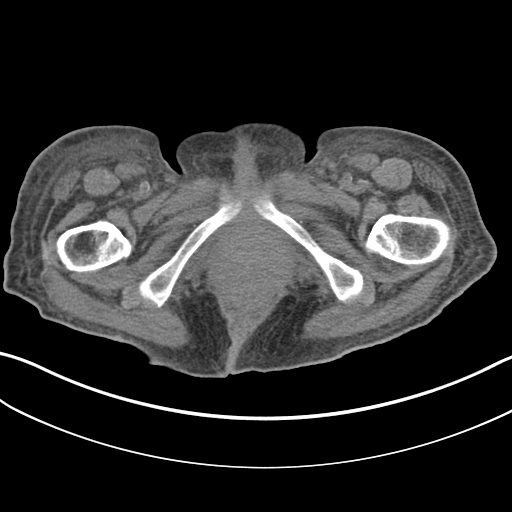
[im 6/102  bone]
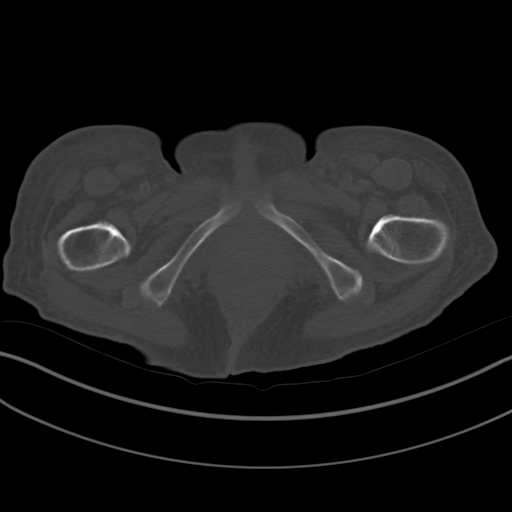
[im 16/102  soft-tissue]
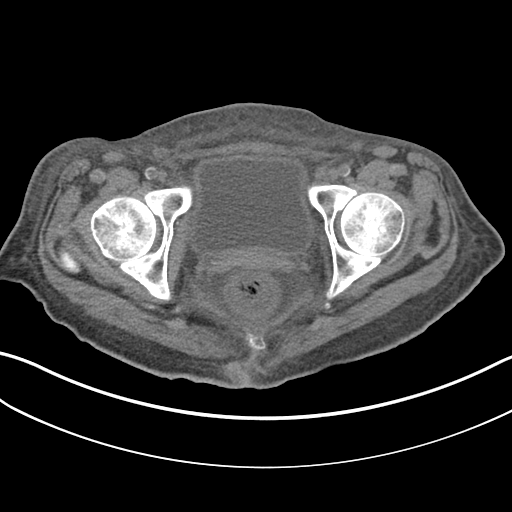
[im 21/102  soft-tissue]
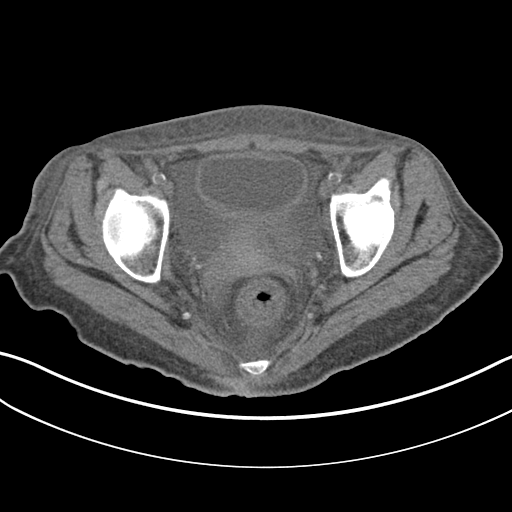
[im 31/102  soft-tissue]
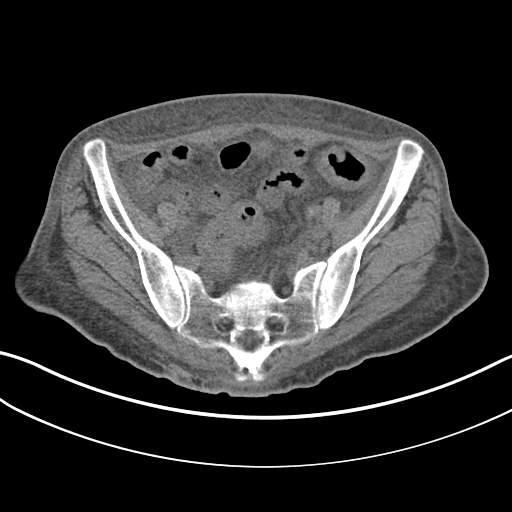
[im 36/102  soft-tissue]
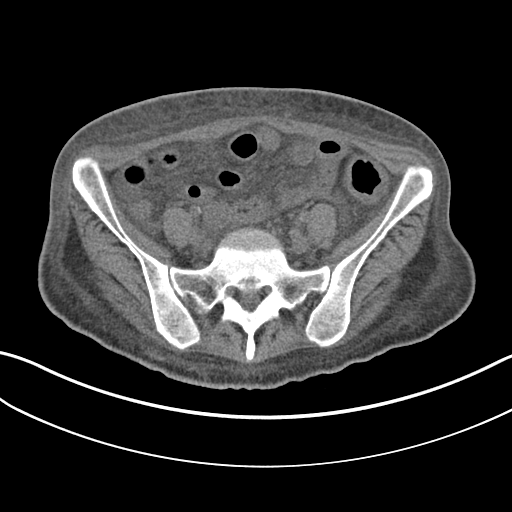
[im 46/102  soft-tissue]
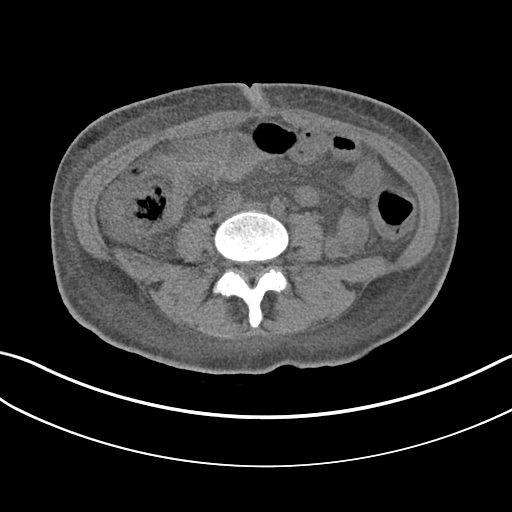
[im 51/102  soft-tissue]
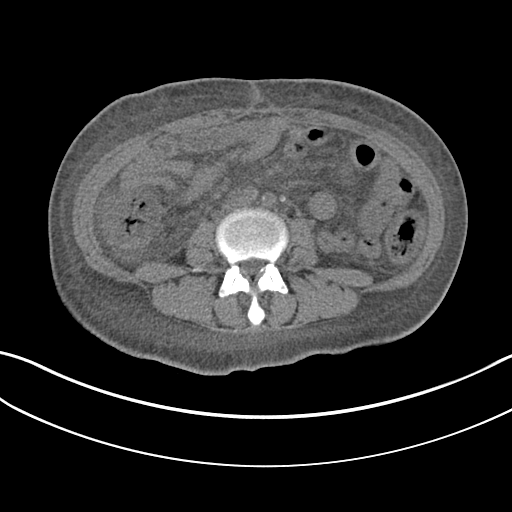
[im 56/102  soft-tissue]
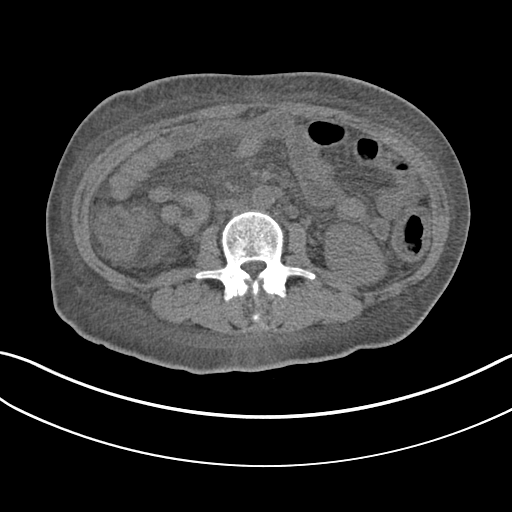
[im 66/102  soft-tissue]
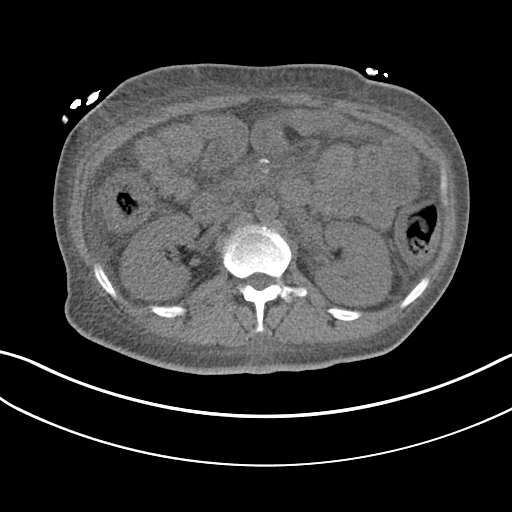
[im 66/102  bone]
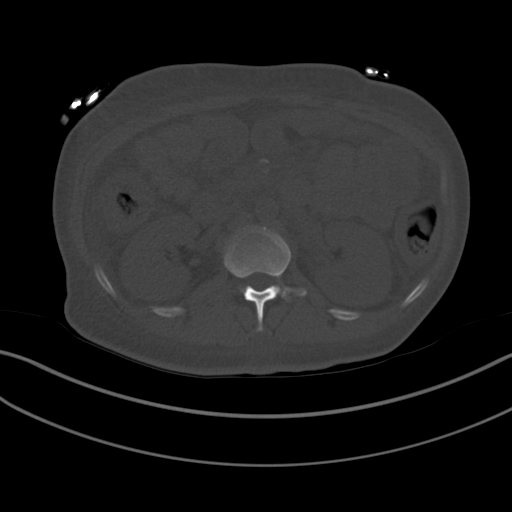
[im 71/102  soft-tissue]
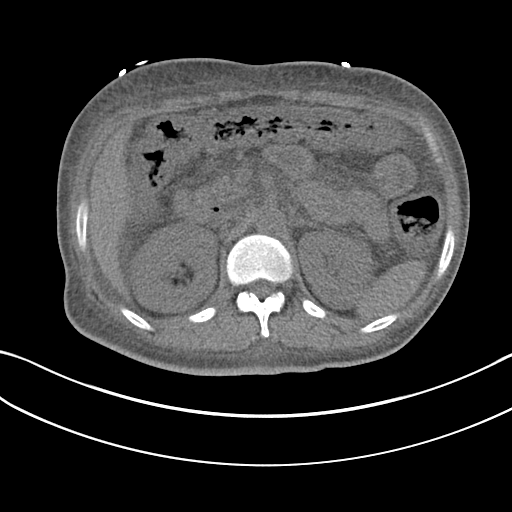
[im 81/102  soft-tissue]
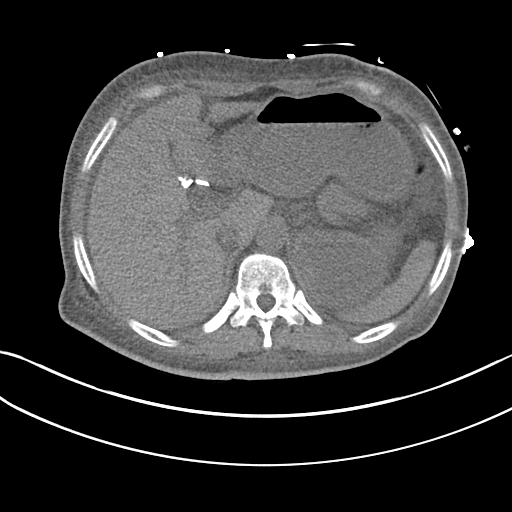
[im 86/102  soft-tissue]
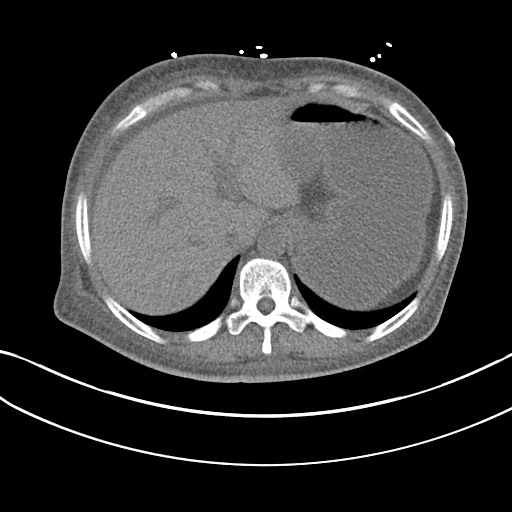
[im 96/102  soft-tissue]
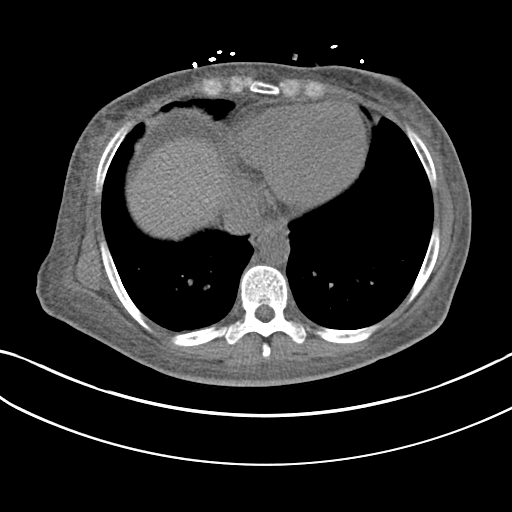

[Series 5: coronal st · coronal · 0.75mm/px · 3 of 117 slices shown]
[im 39/117  soft-tissue]
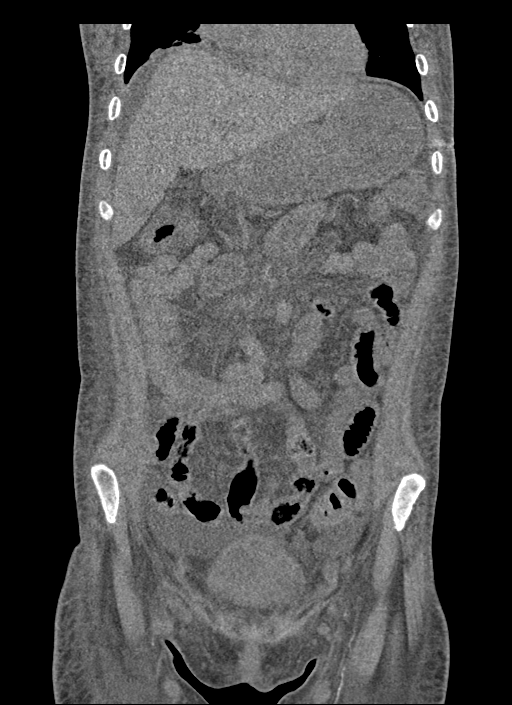
[im 52/117  soft-tissue]
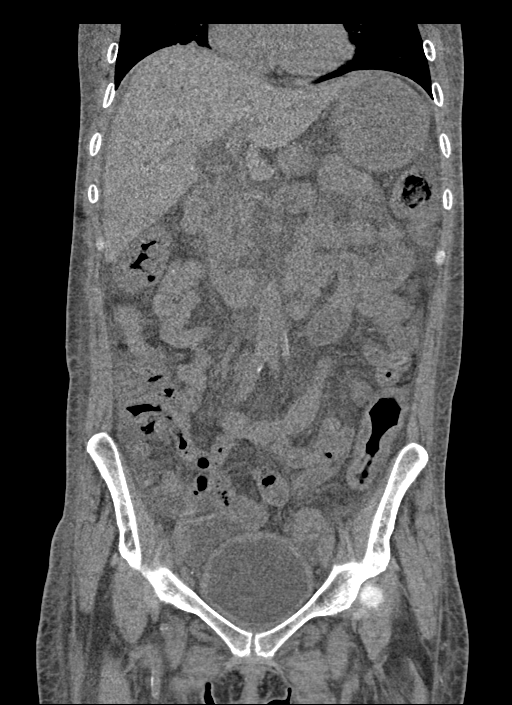
[im 65/117  soft-tissue]
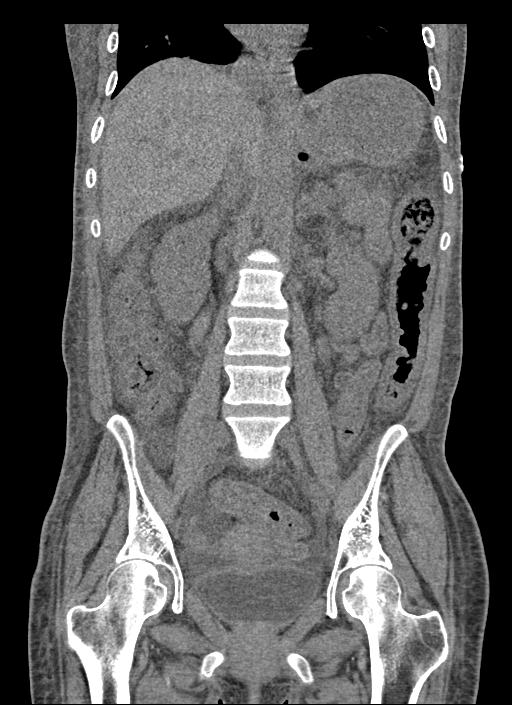

[16 of 46 positions shown; findings below may reference images not displayed]

FINDINGS: Lower chest: Right middle lobe opacity at the right lung base,
likely atelectasis. No effusions.

Hepatobiliary: Prior cholecystectomy.  No focal hepatic abnormality.

Pancreas: No focal abnormality or ductal dilatation.

Spleen: No focal abnormality.  Normal size.

Adrenals/Urinary Tract: No adrenal abnormality. No focal renal
abnormality. No stones or hydronephrosis. Urinary bladder is
unremarkable.

Stomach/Bowel: Appears to be diffuse colonic wall thickening. This
could be related to hypoproteinemia or colitis. Recommend clinical
correlation. No bowel obstruction. Stomach and small bowel grossly
unremarkable.

Vascular/Lymphatic: No evidence of aneurysm or adenopathy.

Reproductive: Uterus and adnexa unremarkable.  No mass.

Other: Moderate free fluid in the abdomen and pelvis. Diffuse hazy
stranding throughout the mesentery and subcutaneous soft tissues
suggesting anasarca.

Musculoskeletal: No acute bony abnormality.
IMPRESSION: Moderate free fluid in the abdomen or pelvis.

Diffuse wall thickening in the colon could be seen with
hypoproteinemia or colitis. Recommend clinical correlation.

Anasarca like edema throughout the mesentery and subcutaneous soft
tissues.

## 2020-09-25 IMAGING — DX DG CHEST 1V PORT
1 series · 1 of 1 positions shown · non-contrast
Comparison: [DATE]

CLINICAL DATA: Dizziness.

EXAM:
PORTABLE CHEST 1 VIEW

[chest ap]
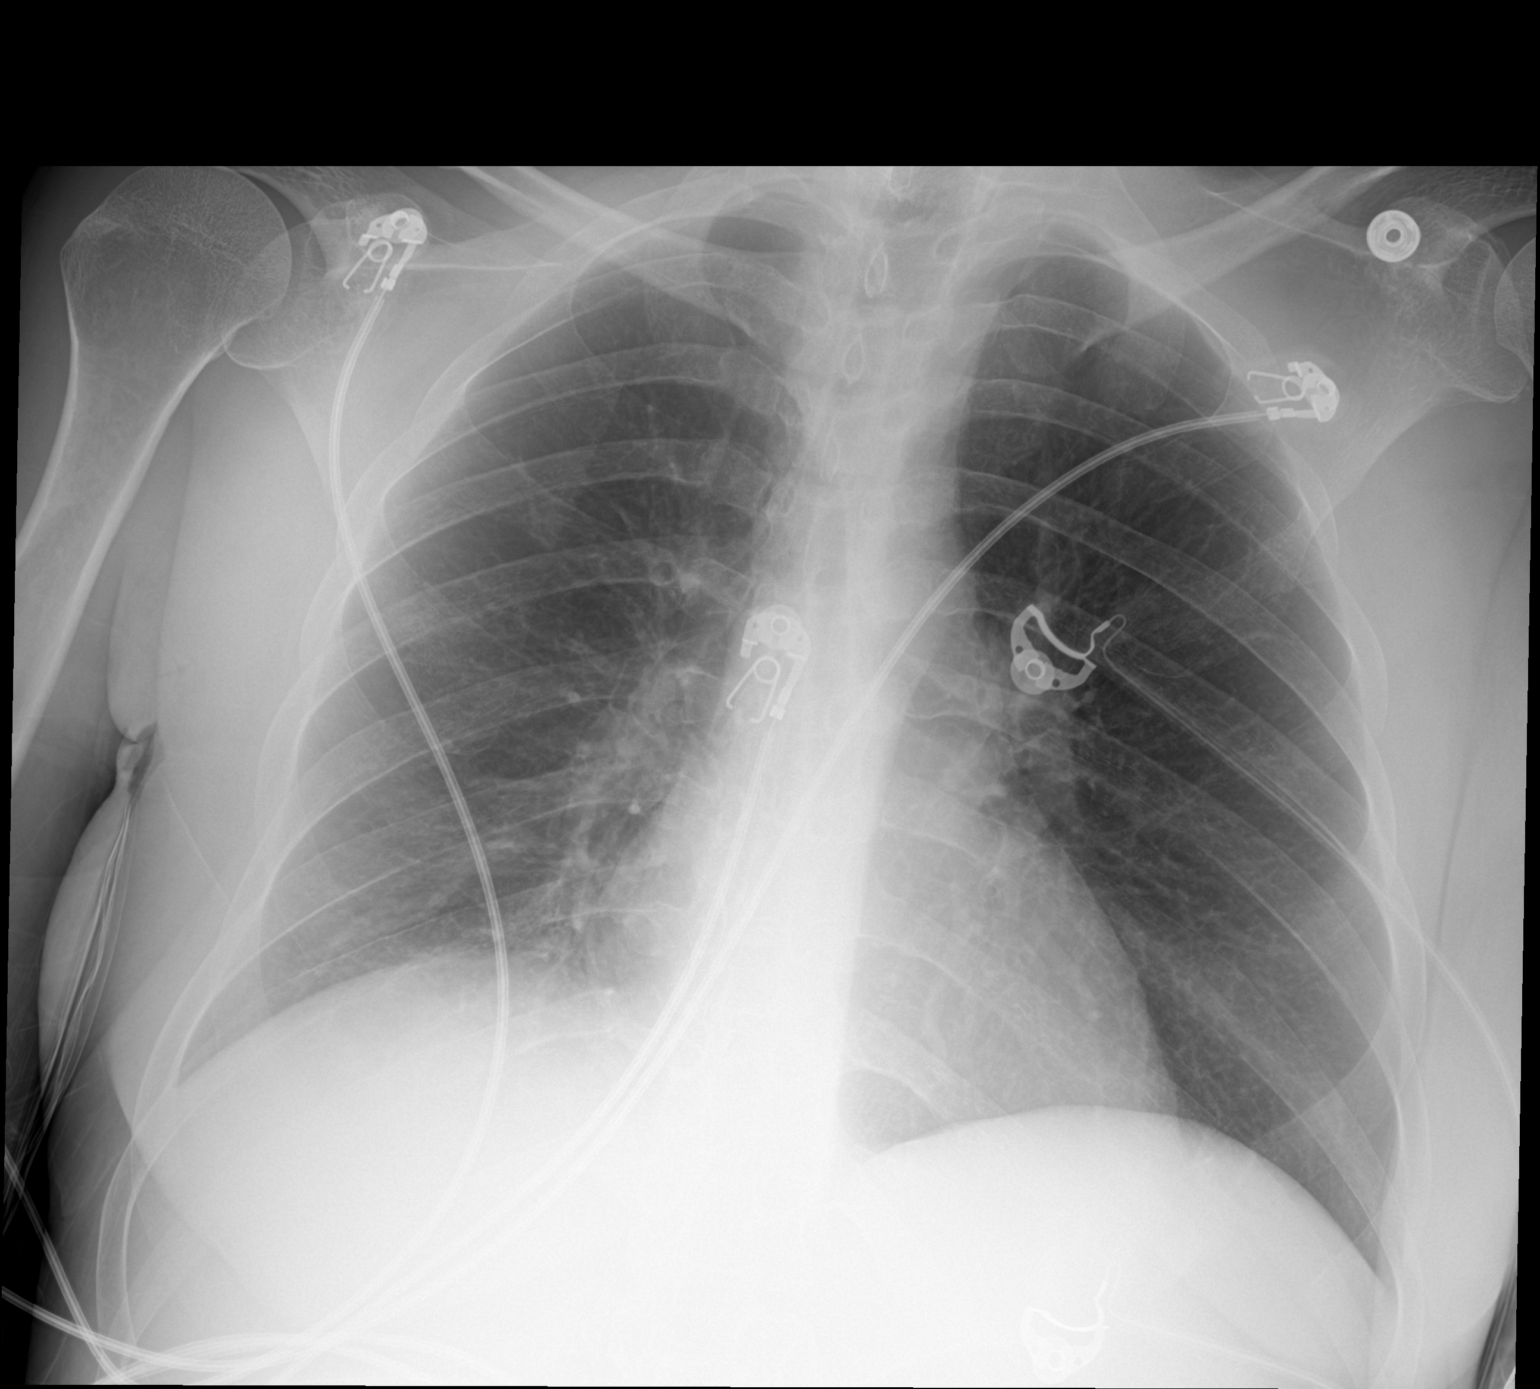

[1 of 1 positions shown; findings below may reference images not displayed]

FINDINGS: Very mild, stable right infrahilar atelectasis is seen. There is no
evidence of a pleural effusion or pneumothorax. The heart size and
mediastinal contours are within normal limits. The visualized
skeletal structures are unremarkable.
IMPRESSION: Very mild, stable right infrahilar atelectasis.

## 2020-09-25 MED ORDER — POTASSIUM CHLORIDE 10 MEQ/100ML IV SOLN
10.0000 meq | Freq: Once | INTRAVENOUS | Status: AC
  Administered 2020-09-25: 10 meq via INTRAVENOUS
  Filled 2020-09-25: qty 100

## 2020-09-25 MED ORDER — GLUCOSE 40 % PO GEL
1.0000 | ORAL | Status: DC | PRN
  Filled 2020-09-25: qty 1.21

## 2020-09-25 MED ORDER — FENTANYL CITRATE (PF) 100 MCG/2ML IJ SOLN
50.0000 ug | Freq: Once | INTRAMUSCULAR | Status: AC
Start: 2020-09-25 — End: 2020-09-25
  Administered 2020-09-25: 50 ug via INTRAVENOUS
  Filled 2020-09-25: qty 2

## 2020-09-25 MED ORDER — FENTANYL CITRATE (PF) 100 MCG/2ML IJ SOLN
50.0000 ug | Freq: Once | INTRAMUSCULAR | Status: AC
  Administered 2020-09-25: 50 ug via INTRAVENOUS
  Filled 2020-09-25: qty 2

## 2020-09-25 MED ORDER — POTASSIUM CHLORIDE 20 MEQ PO PACK
40.0000 meq | PACK | Freq: Every day | ORAL | Status: DC
  Administered 2020-09-25: 40 meq via ORAL
  Filled 2020-09-25: qty 2

## 2020-09-25 MED ORDER — LORAZEPAM 2 MG/ML IJ SOLN
1.0000 mg | Freq: Once | INTRAMUSCULAR | Status: AC
  Administered 2020-09-25: 1 mg via INTRAVENOUS
  Filled 2020-09-25: qty 1

## 2020-09-25 MED ORDER — SODIUM CHLORIDE 0.9 % IV BOLUS
1000.0000 mL | Freq: Once | INTRAVENOUS | Status: AC
  Administered 2020-09-25: 1000 mL via INTRAVENOUS

## 2020-09-25 MED ORDER — DEXTROSE 50 % IV SOLN
50.0000 mL | Freq: Once | INTRAVENOUS | Status: AC
  Administered 2020-09-25: 50 mL via INTRAVENOUS
  Filled 2020-09-25: qty 50

## 2020-09-25 MED ORDER — ONDANSETRON HCL 4 MG/2ML IJ SOLN: 4.0000 mg | Freq: Once | INTRAMUSCULAR | Status: DC

## 2020-09-25 NOTE — ED Notes (Signed)
Assisted pt to bedside commode. Husband is at bedside. callbell within reach

## 2020-09-25 NOTE — ED Provider Notes (Signed)
[facility] DEPT Provider Note   CSN: [id number] Arrival date & time: 09/25/20  2002     History Chief Complaint  Patient presents with   Hypoglycemia    [name] is a 30 y.o. female.  Patient arrives altered with her husband.  Blood sugar found to be 49 in triage and given oral dextrose with improvement.  History of diabetes on insulin.  History of bad gastroparesis and chronic pain.  Has had nausea and vomiting and was unable to tolerate much p.o. after taking her insulin this morning.  Does admit to smoking marijuana still.  Denies any abdominal pain.  Having some low back pain which is overall chronic for her.  Denies any urinary symptoms.  The history is provided by the patient.  Hypoglycemia Initial blood sugar:  49 Severity:  Moderate Onset quality:  Sudden Timing:  Constant Progression:  Unchanged Chronicity:  Recurrent Time since last antidiabetic medication:  8 hours Context: decreased oral intake   Associated symptoms: altered mental status, decreased responsiveness, vomiting and weakness   Associated symptoms: no seizures and no shortness of breath       Past Medical History:  Diagnosis Date   Allergy    Anemia    Anxiety    Blood transfusion without reported diagnosis    Dec 2018   Cataract    right eye   COVID-19 03/2020   Depression    Diabetes type 1, uncontrolled ([place]) 11/14/2011   Since age 51   Fibromyalgia    Gastroparesis    GERD (gastroesophageal reflux disease)    Hypertension    Infection    UTI April 2016    Patient Active Problem List   Diagnosis Date Noted   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[place])    Hypoxemic respiratory failure, chronic (H[facility])    Abnormal CT of the chest    GI bleeding 05/12/2020   Iron deficiency 04/11/2020   Osteomyelitis ([place]) 04/10/2020   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 ([place]) 02/03/2020   Cannabinoid hyperemesis syndrome 04/30/2019   Contraception management  37/90/2409   Cyclical vomiting syndrome 06/07/2018   Diabetic gastroparesis associated with type 1 diabetes mellitus ([place]) 06/04/2018   Anxiety 05/31/2018   Peripheral edema 05/31/2018   Chronic anemia 07/10/2017   GERD (gastroesophageal reflux disease) 06/23/2017   MDD (major depressive disorder), recurrent episode, 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/2017   Diabetes mellitus type 1 ([place]) 04/28/2013   Severe protein-calorie malnutrition ([place]) 04/28/2013   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   Hypertension associated with diabetes ([place]) 11/14/2011   Chronic abdominal pain 09/18/2011    Past Surgical History:  Procedure Laterality Date   AMPUTATION Right 04/13/2020   Procedure: 1ST RAY AMPUTATION RIGHT FOOT;  Surgeon: Newt Minion, MD;  Location: [facility];  Service: Orthopedics;  Laterality: Right;   AMPUTATION Right 06/06/2020   Procedure: RIGHT BELOW KNEE AMPUTATION;  Surgeon: Newt Minion, MD;  Location: [facility];  Service: Orthopedics;  Laterality: Right;   ANKLE SURGERY     APPLICATION OF WOUND VAC Right 06/06/2020   Procedure: APPLICATION OF WOUND VAC;  Surgeon: Newt Minion, MD;  Location: [facility];  Service: Orthopedics;  Laterality: Right;   CHOLECYSTECTOMY  11/15/2011   Procedure: LAPAROSCOPIC CHOLECYSTECTOMY WITH INTRAOPERATIVE CHOLANGIOGRAM;  Surgeon: [name], MD;  Location: [facility];  Service: General;  Laterality: N/A;   COLONOSCOPY     COLONOSCOPY WITH PROPOFOL N/A 06/27/2017   Procedure: COLONOSCOPY WITH  PROPOFOL;  Surgeon: Milus Banister, MD;  Location: [facility];  Service: Endoscopy;  Laterality: N/A;   ESOPHAGOGASTRODUODENOSCOPY  12/03/2011   Procedure: ESOPHAGOGASTRODUODENOSCOPY (EGD);  Surgeon: Beryle Beams, MD;  Location: [facility];  Service: Endoscopy;  Laterality: N/A;   FLEXIBLE SIGMOIDOSCOPY N/A 03/10/2017   Procedure: FLEXIBLE SIGMOIDOSCOPY;  Surgeon: Carol Ada, MD;  Location: [facility];  Service: Endoscopy;  Laterality: N/A;    INCISION AND DRAINAGE PERIRECTAL ABSCESS N/A 03/01/2017   Procedure: IRRIGATION AND DEBRIDEMENT PERIRECTAL ABSCESS;  Surgeon: Alphonsa Overall, MD;  Location: [facility];  Service: General;  Laterality: N/A;   IRRIGATION AND DEBRIDEMENT BUTTOCKS N/A 03/23/2017   Procedure: IRRIGATION AND DEBRIDEMENT BUTTOCKS, SETON PLACEMENT;  Surgeon: [name], MD;  Location: [facility];  Service: General;  Laterality: N/A;   LAPAROSCOPY  11/23/2011   Procedure: LAPAROSCOPY DIAGNOSTIC;  Surgeon: Edward Jolly, MD;  Location: [facility];  Service: General;  Laterality: N/A;   SIGMOIDOSCOPY     UPPER GASTROINTESTINAL ENDOSCOPY     WISDOM TOOTH EXTRACTION       OB History     Gravida  1   Para  1   Term      Preterm  1   AB      Living  1      SAB      IAB      Ectopic      Multiple  0   Live Births  1           Family History  Problem Relation Age of Onset   Diabetes Mother    Hypertension Father    Colon cancer Paternal Grandmother        pt thinks PGM was dx in her 64's   Diabetes Paternal Grandmother    Diabetes Maternal Grandmother    Diabetes Maternal Grandfather    Diabetes Paternal Grandfather    Diabetes Other    Breast cancer Paternal Aunt    Esophageal cancer Neg Hx    Liver cancer Neg Hx    Pancreatic cancer Neg Hx    Stomach cancer Neg Hx    Rectal cancer Neg Hx     Social History   Tobacco Use   Smoking status: Never   Smokeless tobacco: Never  Vaping Use   Vaping Use: Never used  Substance Use Topics   Alcohol use: No    Alcohol/week: 0.0 standard drinks   Drug use: Yes    Types: Marijuana    Home Medications Prior to Admission medications   Medication Sig Start Date End Date Taking? Authorizing Provider  amLODipine (NORVASC) 10 MG tablet Take 1 tablet (10 mg total) by mouth daily. 06/15/20   Thurnell Lose, MD  busPIRone (BUSPAR) 5 MG tablet Take 1 tablet (5 mg total) by mouth 3 (three) times daily. 02/03/20   [name], MD  carvedilol  (COREG) 12.5 MG tablet Take 1 tablet (12.5 mg total) by mouth 2 (two) times daily with a meal. 04/16/20 07/15/20  Dahal, Marlowe Aschoff, MD  Continuous Blood Gluc Sensor (DEXCOM G6 SENSOR) MISC USE AS DIRECTED EVERY 10 DAYS 07/28/20   Renato Shin, MD  Continuous Blood Gluc Transmit (DEXCOM G6 TRANSMITTER) MISC USE AS DIRECTED TO  MONITOR  GLUCOSE  LEVELS 05/10/20   Renato Shin, MD  cyclobenzaprine (FLEXERIL) 10 MG tablet TAKE 1 TABLET(10 MG) BY MOUTH THREE TIMES DAILY AS NEEDED FOR MUSCLE SPASMS 09/13/20   [name], MD  dicyclomine (BENTYL) 20 MG tablet Take  1 tablet (20 mg total) by mouth every 8 (eight) hours as needed for spasms. 11/25/19   Petrucelli, Samantha R, PA-C  EPINEPHrine 0.3 mg/0.3 mL IJ SOAJ injection Inject 0.3 mg into the muscle once as needed (anaphylaxis/allergic reaction). 07/16/20   [name], MD  furosemide (LASIX) 40 MG tablet Take 1 tablet (40 mg total) by mouth daily. 05/24/20 11/20/20  Mercy Riding, MD  furosemide (LASIX) 40 MG tablet TAKE 1 TABLET (40 MG TOTAL) BY MOUTH DAILY. 05/24/20 05/24/21  Mercy Riding, MD  insulin glargine (LANTUS) 100 UNIT/ML injection Inject 10 Units into the skin 2 (two) times daily. Morning & bedtime    [provider]  insulin lispro (HUMALOG) 100 UNIT/ML injection Inject 0-5 Units into the skin 3 (three) times daily before meals. Sliding scale    [provider]  Insulin NPH, Human,, Isophane, (HUMULIN N) 100 UNIT/ML Kiwkpen INJECT 10 UNITS INTO THE SKIN TWO TIMES DAILY AT 8 IN THE MORNING AND 10 PM 05/24/20 05/24/21  Mercy Riding, MD  Insulin Pen Needle 32G X 4 MM MISC USE AS DIRECTED WITH INSULIN PENS 05/24/20 05/24/21  Mercy Riding, MD  loperamide (IMODIUM) 2 MG capsule Take 6 mg by mouth daily as needed for diarrhea or loose stools.     [provider]  LORazepam (ATIVAN) 0.5 MG tablet TAKE 1 TABLET(0.5 MG) BY MOUTH TWICE DAILY AS NEEDED FOR ANXIETY 06/18/20   [name], MD  metoCLOPramide (REGLAN) 5 MG tablet  Take 1 tablet (5 mg total) by mouth in the morning and at bedtime. Please make and keep an appointment for further refills. 07/20/20   Armbruster, Carlota Raspberry, MD  mirtazapine (REMERON) 30 MG tablet TAKE 1 TABLET BY MOUTH AT  BEDTIME Patient taking differently: Take 30 mg by mouth at bedtime. 01/17/20   [name], MD  ondansetron (ZOFRAN ODT) 4 MG disintegrating tablet Take 1 tablet (4 mg total) by mouth every 8 (eight) hours as needed for nausea or vomiting. 06/14/20   Thurnell Lose, MD  oxyCODONE (ROXICODONE) 5 MG immediate release tablet Take 1 tablet (5 mg total) by mouth 3 (three) times daily as needed for severe pain. 09/13/20   [name], MD  pantoprazole (PROTONIX) 40 MG tablet Take 1 tablet (40 mg total) by mouth 2 (two) times daily. 12/06/19   Armbruster, Carlota Raspberry, MD  silver sulfADIAZINE (SILVADENE) 1 % cream Apply 1 application topically daily. Apply small amount to foot wound daily cover with dry dressing 05/24/20   Persons, Bevely Palmer, PA  potassium chloride SA (KLOR-CON) 20 MEQ tablet Take 1 tablet (20 mEq total) by mouth daily for 5 doses. 07/04/19 07/30/19  Janeece Fitting, PA-C    Allergies    Other and Lactose intolerance (gi)  Review of Systems   Review of Systems  Constitutional:  Positive for decreased responsiveness. Negative for chills and fever.  HENT:  Negative for ear pain and sore throat.   Eyes:  Negative for pain and visual disturbance.  Respiratory:  Negative for cough and shortness of breath.   Cardiovascular:  Negative for chest pain and palpitations.  Gastrointestinal:  Positive for nausea and vomiting. Negative for abdominal pain.  Genitourinary:  Negative for dysuria and hematuria.  Musculoskeletal:  Positive for back pain. Negative for arthralgias.  Skin:  Negative for color change and rash.  Neurological:  Positive for weakness. Negative for seizures and syncope.  Psychiatric/Behavioral:  Positive for confusion.   All other systems  reviewed and are  negative.  Physical Exam Updated Vital Signs BP (!) 167/112   Pulse 74   Temp (!) 90.9 F (32.7 C) (Rectal)   Resp 15   Ht 5' 7"  (1.702 m)   Wt 63.5 kg   SpO2 97%   BMI 21.93 kg/m   Physical Exam Vitals and nursing note reviewed.  Constitutional:      General: She is in acute distress.     Appearance: She is well-developed. She is not ill-appearing.  HENT:     Head: Normocephalic and atraumatic.     Nose: Nose normal.     Mouth/Throat:     Mouth: Mucous membranes are moist.  Eyes:     Extraocular Movements: Extraocular movements intact.     Conjunctiva/sclera: Conjunctivae normal.     Pupils: Pupils are equal, round, and reactive to light.  Cardiovascular:     Rate and Rhythm: Normal rate and regular rhythm.     Pulses: Normal pulses.     Heart sounds: Normal heart sounds. No murmur heard. Pulmonary:     Effort: Pulmonary effort is normal. No respiratory distress.     Breath sounds: Normal breath sounds.  Abdominal:     General: Abdomen is flat. There is no distension.     Palpations: Abdomen is soft. There is no mass.     Tenderness: There is no abdominal tenderness. There is no guarding.     Hernia: No hernia is present.  Musculoskeletal:        General: Normal range of motion.     Cervical back: Normal range of motion and neck supple.  Skin:    General: Skin is warm and dry.     Capillary Refill: Capillary refill takes less than 2 seconds.  Neurological:     General: No focal deficit present.     Mental Status: She is alert and oriented to person, place, and time.     Cranial Nerves: No cranial nerve deficit.     Sensory: No sensory deficit.     Motor: No weakness.    ED Results / Procedures / Treatments   Labs (all labs ordered are listed, but only abnormal results are displayed) Labs Reviewed  CBC WITH DIFFERENTIAL/PLATELET - Abnormal; Notable for the following components:      Result Value   Hemoglobin 11.3 (*)    HCT 32.9 (*)    All other  components within normal limits  COMPREHENSIVE METABOLIC PANEL - Abnormal; Notable for the following components:   Potassium 2.8 (*)    Glucose, Bld 46 (*)    BUN 24 (*)    Creatinine, Ser 1.22 (*)    Calcium 8.5 (*)    Albumin 2.5 (*)    All other components within normal limits  CBG MONITORING, ED - Abnormal; Notable for the following components:   Glucose-Capillary 49 (*)    All other components within normal limits  CBG MONITORING, ED - Abnormal; Notable for the following components:   Glucose-Capillary 59 (*)    All other components within normal limits  CBG MONITORING, ED - Abnormal; Notable for the following components:   Glucose-Capillary 178 (*)    All other components within normal limits  CBG MONITORING, ED - Abnormal; Notable for the following components:   Glucose-Capillary 154 (*)    All other components within normal limits  CBG MONITORING, ED - Abnormal; Notable for the following components:   Glucose-Capillary 150 (*)    All other components within normal  limits  RESP PANEL BY RT-PCR (FLU A&B, COVID) ARPGX2  LIPASE, BLOOD  MAGNESIUM  URINALYSIS, ROUTINE W REFLEX MICROSCOPIC  RAPID URINE DRUG SCREEN, HOSP PERFORMED  I-STAT BETA HCG BLOOD, ED ([facility], [facility], [facility] ONLY)    EKG EKG Interpretation  Date/Time:  Tuesday September 25 2020 20:40:14 EDT Ventricular Rate:  83 PR Interval:  175 QRS Duration: 94 QT Interval:  464 QTC Calculation: 546 R Axis:   65 Text Interpretation: Sinus rhythm Prolonged QT interval Confirmed by Lennice Sites ([id number]) on 09/25/2020 8:43:37 PM  Radiology DG Chest Portable 1 View  Result Date: 09/25/2020 CLINICAL DATA:  Dizziness. EXAM: PORTABLE CHEST 1 VIEW COMPARISON:  September 20, 2020 FINDINGS: Very mild, stable right infrahilar atelectasis is seen. There is no evidence of a pleural effusion or pneumothorax. The heart size and mediastinal contours are within normal limits. The visualized skeletal structures are unremarkable. IMPRESSION: Very mild,  stable right infrahilar atelectasis. Electronically Signed   By: Virgina Norfolk M.D.   On: 09/25/2020 21:18    Procedures .Critical Care  Date/Time: 09/25/2020 10:43 PM Performed by: Lennice Sites, DO Authorized by: Lennice Sites, DO   Critical care provider statement:    Critical care time (minutes):  45   Critical care was necessary to treat or prevent imminent or life-threatening deterioration of the following conditions:  Endocrine crisis   Critical care was time spent personally by me on the following activities:  Blood draw for specimens, development of treatment plan with patient or surrogate, evaluation of patient's response to treatment, ordering and performing treatments and interventions, ordering and review of laboratory studies, ordering and review of radiographic studies, re-evaluation of patient's condition, pulse oximetry, review of old charts, examination of patient and obtaining history from patient or surrogate   Medications Ordered in ED Medications  dextrose (GLUTOSE) 40 % oral gel 31 g (has no administration in time range)  potassium chloride 10 mEq in 100 mL IVPB (10 mEq Intravenous New Bag/Given 09/25/20 2237)  potassium chloride (KLOR-CON) packet 40 mEq (40 mEq Oral Given 09/25/20 2209)  dextrose 50 % solution 50 mL (50 mLs Intravenous Given 09/25/20 2033)  fentaNYL (SUBLIMAZE) injection 50 mcg (50 mcg Intravenous Given 09/25/20 2043)  sodium chloride 0.9 % bolus 1,000 mL (1,000 mLs Intravenous Bolus from Bag 09/25/20 2101)  LORazepam (ATIVAN) injection 1 mg (1 mg Intravenous Given 09/25/20 2058)    ED Course  I have reviewed the triage vital signs and the nursing notes.  Pertinent labs & imaging results that were available during my care of the patient were reviewed by me and considered in my medical decision making (see chart for details).    MDM Rules/Calculators/A&P                          ANASHA PERFECTO is here with altered mental status.  Found to have  low blood sugar in triage of 49.  She was given oral dextrose with improvement.  Patient with diabetes, gastroparesis history.  Took her insulin this morning but was having some nausea and vomiting and unable to hold down any food.  She denies any abdominal pain currently.  She is having some chronic low back pain.  Denies any pain with urination.  She is hypothermic but likely secondary to acute drop in blood sugar.  She is feeling much better after oral and IV dextrose.  Blood sugar has improved.  She is having gastroparesis symptoms and will give fentanyl and  Ativan as EKG shows mildly prolonged QTC.  No ischemic changes.  No chest pain.  Will check basic labs, give IV fluids and reevaluate.  Blood sugar appears to be stabilizing in the 100s.  Magnesium is 1.8.  Potassium slightly low at 2.8 and will give IV and oral repletion.  COVID test is negative.  No significant leukocytosis.  No significant anemia.  Pregnancy test is negative.  Chest x-ray shows no obvious infectious process.  Awaiting urinalysis.  Pain and nausea have improved following fentanyl and Ativan.  Repeat temperature still shows that patient is hypothermic at 91 degrees.  We will continue to rewarm, check blood sugars, check urinalysis.  Handed off to oncoming ED staff with patient pending final disposition.  This chart was dictated using voice recognition software.  Despite best efforts to proofread,  errors can occur which can change the documentation meaning.    Final Clinical Impression(s) / ED Diagnoses Final diagnoses:  Hypoglycemia  Hypothermia, initial encounter    Rx / DC Orders ED Discharge Orders     None        Lennice Sites, DO 09/25/20 2244

## 2020-09-25 NOTE — ED Provider Notes (Signed)
  Provider Note MRN:  [id number]  Arrival date & time: 09/26/20    ED Course and Medical Decision Making  Assumed care from Dr. Ronnald Nian at shift change.  Hypoglycemia, hypothermia, clinically improved but still hypothermic, awaiting CT imaging, providing warm fluids and will reassess.  Temperature steadily improving.  Taken off the [facility] and continued observation for a few hours, patient is maintaining her own temperature, blood sugars have stabilized and are normal.  CT imaging is overall reassuring, patient seems to have some anasarca or swelling likely related to her low albumin.  Seems to be a chronic issue.  Patient is feeling well and is requesting discharge, no indication for further testing or admission, advised close primary care follow-up.  Procedures  Final Clinical Impressions(s) / ED Diagnoses     ICD-10-CM   1. Hypoglycemia  E16.2     2. Hypothermia, initial encounter  T68.[facility]       ED Discharge Orders     None         Discharge Instructions      You were evaluated in the Emergency Department and after careful evaluation, we did not find any emergent condition requiring admission or further testing in the hospital.  Your exam/testing today was overall reassuring.  Please keep a close eye on your blood sugars at home and follow-up with your regular doctor to discuss your insulin dosing.  Please return to the Emergency Department if you experience any worsening of your condition.  Thank you for allowing [place] to be a part of your care.       Barth Kirks. Sedonia Small, [place] [email or web address]    Maudie Flakes, MD 09/26/20 [phone]

## 2020-09-25 NOTE — ED Triage Notes (Signed)
Patient arrived stating she is dizzy. Sugar in triage 49. No other complaints at this time.

## 2020-09-25 NOTE — ED Notes (Signed)
Pt has IV medications infusing through fluid warmer

## 2020-09-25 NOTE — ED Notes (Signed)
OJ and sugar given

## 2020-09-25 NOTE — ED Notes (Signed)
Unable to use specimen provided. Pt placed soiled tissue from BM and had stool in urine collection hat.

## 2020-09-26 LAB — CBG MONITORING, ED: Glucose-Capillary: 190 mg/dL — ABNORMAL HIGH (ref 70–99)

## 2020-09-26 NOTE — ED Notes (Signed)
RN brought pt additional blankets and found warmer turned back on.  Rn explained to pt that it is important that staff can assess if she can regulate her temperature without intervention.

## 2020-09-26 NOTE — ED Notes (Signed)
Pt verbalized understanding of d/c.

## 2020-09-26 NOTE — Discharge Instructions (Addendum)
You were evaluated in the Emergency Department and after careful evaluation, we did not find any emergent condition requiring admission or further testing in the hospital.  Your exam/testing today was overall reassuring.  Please keep a close eye on your blood sugars at home and follow-up with your regular doctor to discuss your insulin dosing.  Please return to the Emergency Department if you experience any worsening of your condition.  Thank you for allowing [place] to be a part of your care.

## 2020-09-26 NOTE — ED Notes (Addendum)
bair warmer d/c per Sedonia Small, MD

## 2020-09-26 NOTE — ED Notes (Signed)
Pt transported to CT. Potassium paused

## 2020-09-27 ENCOUNTER — Other Ambulatory Visit: Payer: Self-pay

## 2020-09-27 NOTE — Telephone Encounter (Signed)
  LAST APPOINTMENT DATE: 06/27/20   NEXT APPOINTMENT DATE:@Visit  date not found  MEDICATION:oxyCODONE (ROXICODONE) 5 MG immediate release tablet  PHARMACY:[id number] - [place], [place] - [address]

## 2020-09-28 MED ORDER — OXYCODONE HCL 5 MG PO TABS: 5.0000 mg | ORAL_TABLET | Freq: Three times a day (TID) | ORAL | 0 refills | Status: DC | PRN

## 2020-10-08 ENCOUNTER — Telehealth: Payer: Self-pay | Admitting: Pharmacy Technician

## 2020-10-08 ENCOUNTER — Other Ambulatory Visit: Payer: Self-pay

## 2020-10-08 MED ORDER — OXYCODONE HCL 5 MG PO TABS: 5.0000 mg | ORAL_TABLET | Freq: Three times a day (TID) | ORAL | 0 refills | Status: DC | PRN

## 2020-10-08 NOTE — Telephone Encounter (Signed)
Refill sent to Dr. Jerline Pain for approval.

## 2020-10-08 NOTE — Telephone Encounter (Signed)
Please let patient know she needs office visit for refills.  Algis Greenhouse. Jerline Pain, MD 10/08/2020 12:57 PM

## 2020-10-08 NOTE — Telephone Encounter (Signed)
Patient is scheduled   

## 2020-10-08 NOTE — Telephone Encounter (Signed)
Patient Advocate Encounter   Received notification from patient's pharmacy that prior authorization for [place] is required.   The pharmacy and I realized she is now uninsured.  I made a HIPPA compliant phone call to the patient and confirmed that is correct.  I also mentioned that she can call [facility], make an appointment and they may be able to help her with some of her medical and pharmacy needs.    [facility] will continue to follow.   Venida Jarvis. Nadara Mustard, CPhT Patient Advocate [facility] Phone: [phone] Fax:  [phone]

## 2020-10-08 NOTE — Telephone Encounter (Signed)
  LAST APPOINTMENT DATE: 06/28/20  NEXT APPOINTMENT DATE:@Visit  date not found  MEDICATION:oxyCODONE (ROXICODONE) 5 MG immediate release tablet  PHARMACY: [place], [place] - [address]

## 2020-10-16 ENCOUNTER — Ambulatory Visit (INDEPENDENT_AMBULATORY_CARE_PROVIDER_SITE_OTHER): Payer: Medicaid Other

## 2020-10-16 ENCOUNTER — Other Ambulatory Visit: Payer: Self-pay | Admitting: Family Medicine

## 2020-10-16 ENCOUNTER — Encounter: Payer: Self-pay | Admitting: Family

## 2020-10-16 ENCOUNTER — Ambulatory Visit (INDEPENDENT_AMBULATORY_CARE_PROVIDER_SITE_OTHER): Payer: Medicaid Other | Admitting: Family

## 2020-10-16 DIAGNOSIS — M79604 Pain in right leg: Secondary | ICD-10-CM

## 2020-10-16 DIAGNOSIS — W19XXXA Unspecified fall, initial encounter: Secondary | ICD-10-CM

## 2020-10-16 MED ORDER — OXYCODONE HCL 5 MG PO TABS: 5.0000 mg | ORAL_TABLET | Freq: Three times a day (TID) | ORAL | 0 refills | Status: DC | PRN

## 2020-10-16 NOTE — Progress Notes (Signed)
Office Visit Note   Patient: [name]           Date of Birth: 1990/10/01           MRN: [id number] Visit Date: 10/16/2020              Requested by: [name], MD [address] [place],  [place] [address] PCP: [name], MD  Chief Complaint  Patient presents with  . Right Knee - Follow-up      HPI: The patient is a 30 year old woman seen for evaluation of right residual limb pain following a fall. 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 This is healed. Has been having significant pain with end bearing after fall. Associated swelling.  Assessment & Plan: Visit Diagnoses:  1. Fall, initial encounter   2. Pain in right leg     Plan: reassurance provided. No fracture. Will bear weight in prosthesis as tolerated. Shrinker for swelling.  Follow-Up Instructions: No follow-ups on file.   Right Knee Exam  Right knee exam is normal.    Patient is alert, oriented, no adenopathy, well-dressed, normal affect, normal respiratory effort. On examination of right residual limb there is pitting edema to tibial tubercle. Incision is well healed. No erythema, no warmth. No sign of infection. Tender over distal tibia.  Imaging: No results found. No images are attached to the encounter.  Labs: Lab Results  Component Value Date   HGBA1C 6.9 (H) 06/04/2020   HGBA1C 8.6 (H) 04/10/2020   HGBA1C 9.0 (A) 02/29/2020   ESRSEDRATE 127 (H) 05/13/2020   ESRSEDRATE >140 (H) 04/09/2020   ESRSEDRATE 125 (H) 03/23/2017   CRP 18.8 (H) 05/18/2020   CRP 42.4 (H) 05/14/2020   CRP 30.1 (H) 05/13/2020   LABURIC 6.8 (H) 01/23/2015   REPTSTATUS 06/05/2020 FINAL 06/04/2020   GRAMSTAIN  05/13/2020    MODERATE WBC PRESENT, PREDOMINANTLY PMN NO ORGANISMS SEEN    CULT  06/04/2020    NO GROWTH Performed at [facility] [address] [place], [place] [address]    LABORGA KLEBSIELLA PNEUMONIAE 03/01/2017     Lab Results  Component Value Date    ALBUMIN 2.5 (L) 09/25/2020   ALBUMIN 2.2 (L) 06/28/2020   ALBUMIN 2.6 (L) 06/27/2020   PREALBUMIN 17.9 (L) 06/09/2018   PREALBUMIN 21.0 02/18/2018   PREALBUMIN 18.1 03/18/2017    Lab Results  Component Value Date   MG 1.8 09/25/2020   MG 2.0 06/28/2020   MG 1.8 06/14/2020   Lab Results  Component Value Date   VD25OH 14 (L) 07/05/2014    Lab Results  Component Value Date   PREALBUMIN 17.9 (L) 06/09/2018   PREALBUMIN 21.0 02/18/2018   PREALBUMIN 18.1 03/18/2017   CBC EXTENDED Latest Ref Rng & Units 09/25/2020 07/10/2020 07/09/2020  WBC 4.0 - 10.5 K/uL 6.2 6.0 6.2  RBC 3.87 - 5.11 MIL/uL 3.88 2.89(A) 2.89(A)  HGB 12.0 - 15.0 g/dL 11.3(L) 8.0(A) 8.1(A)  HCT 36.0 - 46.0 % 32.9(L) 24(A) 23(A)  PLT 150 - 400 K/uL 340 - 158  NEUTROABS 1.7 - 7.7 K/uL 4.0 - -  LYMPHSABS 0.7 - 4.0 K/uL 1.8 - -     There is no height or weight on file to calculate BMI.  Orders:  Orders Placed This Encounter  Procedures  . XR Tibia/Fibula Right   Meds ordered this encounter  Medications  . oxyCODONE (ROXICODONE) 5 MG immediate release tablet  Sig: Take 1 tablet (5 mg total) by mouth 3 (three) times daily as needed for severe pain.    Dispense:  21 tablet    Refill:  0     Procedures: No procedures performed  Clinical Data: No additional findings.  ROS:  All other systems negative, except as noted in the HPI. Review of Systems  Constitutional:  Negative for chills and fever.  Cardiovascular:  Positive for leg swelling.  Musculoskeletal:  Positive for myalgias.   Objective: Vital Signs: There were no vitals taken for this visit.  Specialty Comments:  No specialty comments available.  PMFS History: Patient Active Problem List   Diagnosis Date Noted  . 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 Hypoxemic respiratory failure, chronic ([place])   . Abnormal CT of the chest   . GI bleeding 05/12/2020  . Iron deficiency 04/11/2020  . Osteomyelitis ([place]) 04/10/2020  . 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 Cannabinoid hyperemesis syndrome 04/30/2019  . Contraception management 08/06/2018  . Cyclical vomiting syndrome 06/07/2018  . Diabetic gastroparesis associated with type 1 diabetes mellitus ([place]) 06/04/2018  . Anxiety 05/31/2018  . Peripheral edema 05/31/2018  . Chronic anemia 07/10/2017  . GERD (gastroesophageal reflux disease) 06/23/2017  . 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 Diabetes mellitus type 1 ([place]) 04/28/2013  . Severe protein-calorie malnutrition ([place]) 04/28/2013  . 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 Hypertension associated with diabetes ([place]) 11/14/2011  . Chronic abdominal pain 09/18/2011   Past Medical History:  Diagnosis Date  . Allergy   . Anemia   . Anxiety   . Blood transfusion without reported diagnosis    Dec 2018  . Cataract    right eye  . COVID-19 03/2020  . Depression   . Diabetes type 1, uncontrolled ([place]) 11/14/2011   Since age 45  . Fibromyalgia   . Gastroparesis   . GERD (gastroesophageal reflux disease)   . Hypertension   . Infection    UTI April 2016    Family History  Problem Relation Age of Onset  . Diabetes Mother   . Hypertension Father   . Colon cancer Paternal Grandmother        pt thinks PGM was dx in her 10's  . Diabetes Paternal Grandmother   . Diabetes Maternal Grandmother   . Diabetes Maternal Grandfather   . Diabetes Paternal Grandfather   . Diabetes Other   . Breast cancer Paternal Aunt   . Esophageal cancer Neg Hx   . Liver cancer Neg Hx   . Pancreatic cancer Neg Hx   . Stomach cancer Neg Hx   . Rectal cancer Neg Hx     Past Surgical History:  Procedure Laterality Date  . AMPUTATION Right 04/13/2020   Procedure: 1ST RAY AMPUTATION RIGHT FOOT;  Surgeon: Newt Minion, MD;  Location: [facility];  Service: Orthopedics;  Laterality: Right;  . AMPUTATION Right 06/06/2020   Procedure: RIGHT BELOW KNEE  AMPUTATION;  Surgeon: Newt Minion, MD;  Location: [facility];  Service: Orthopedics;  Laterality: Right;  . ANKLE SURGERY    . APPLICATION OF WOUND VAC Right 06/06/2020   Procedure: APPLICATION OF WOUND VAC;  Surgeon: Newt Minion, MD;  Location: [facility];  Service: Orthopedics;  Laterality: Right;  . CHOLECYSTECTOMY  11/15/2011   Procedure: LAPAROSCOPIC CHOLECYSTECTOMY WITH INTRAOPERATIVE CHOLANGIOGRAM;  Surgeon: [name], MD;  Location: [facility];  Service: General;  Laterality: N/A;  . COLONOSCOPY    . COLONOSCOPY WITH PROPOFOL N/A 06/27/2017   Procedure: COLONOSCOPY WITH PROPOFOL;  Surgeon: Milus Banister, MD;  Location: [facility];  Service: Endoscopy;  Laterality: N/A;  . ESOPHAGOGASTRODUODENOSCOPY  12/03/2011   Procedure: ESOPHAGOGASTRODUODENOSCOPY (EGD);  Surgeon: Beryle Beams, MD;  Location: [facility];  Service: Endoscopy;  Laterality: N/A;  . FLEXIBLE SIGMOIDOSCOPY N/A 03/10/2017   Procedure: FLEXIBLE SIGMOIDOSCOPY;  Surgeon: Carol Ada, MD;  Location: [facility];  Service: Endoscopy;  Laterality: N/A;  . INCISION AND DRAINAGE PERIRECTAL ABSCESS N/A 03/01/2017   Procedure: IRRIGATION AND DEBRIDEMENT PERIRECTAL ABSCESS;  Surgeon: Alphonsa Overall, MD;  Location: [facility];  Service: General;  Laterality: N/A;  . IRRIGATION AND DEBRIDEMENT BUTTOCKS N/A 03/23/2017   Procedure: IRRIGATION AND DEBRIDEMENT BUTTOCKS, SETON PLACEMENT;  Surgeon: [name], MD;  Location: [facility];  Service: General;  Laterality: N/A;  . LAPAROSCOPY  11/23/2011   Procedure: LAPAROSCOPY DIAGNOSTIC;  Surgeon: Edward Jolly, MD;  Location: [facility];  Service: General;  Laterality: N/A;  . SIGMOIDOSCOPY    . UPPER GASTROINTESTINAL ENDOSCOPY    . WISDOM TOOTH EXTRACTION     Social History   Occupational History  . Occupation: [employer] call center    Employer: [employer]  Tobacco Use  . Smoking status: Never  . Smokeless tobacco: Never  Vaping Use  . Vaping Use: Never used  Substance  and Sexual Activity  . Alcohol use: No    Alcohol/week: 0.0 standard drinks  . Drug use: Yes    Types: Marijuana  . Sexual activity: Not Currently    Partners: Male    Birth control/protection: None

## 2020-10-20 ENCOUNTER — Other Ambulatory Visit: Payer: Self-pay | Admitting: Endocrinology

## 2020-10-20 DIAGNOSIS — E1069 Type 1 diabetes mellitus with other specified complication: Secondary | ICD-10-CM

## 2020-10-26 DIAGNOSIS — B9561 Methicillin susceptible Staphylococcus aureus infection as the cause of diseases classified elsewhere: Secondary | ICD-10-CM | POA: Diagnosis not present

## 2020-10-26 DIAGNOSIS — E11621 Type 2 diabetes mellitus with foot ulcer: Secondary | ICD-10-CM | POA: Diagnosis not present

## 2020-10-26 DIAGNOSIS — Z89511 Acquired absence of right leg below knee: Secondary | ICD-10-CM | POA: Diagnosis not present

## 2020-10-26 DIAGNOSIS — X58XXXA Exposure to other specified factors, initial encounter: Secondary | ICD-10-CM | POA: Diagnosis not present

## 2020-10-26 DIAGNOSIS — E871 Hypo-osmolality and hyponatremia: Secondary | ICD-10-CM | POA: Diagnosis not present

## 2020-10-26 DIAGNOSIS — B951 Streptococcus, group B, as the cause of diseases classified elsewhere: Secondary | ICD-10-CM | POA: Diagnosis not present

## 2020-10-26 DIAGNOSIS — N1832 Chronic kidney disease, stage 3b: Secondary | ICD-10-CM | POA: Diagnosis not present

## 2020-10-26 DIAGNOSIS — I129 Hypertensive chronic kidney disease with stage 1 through stage 4 chronic kidney disease, or unspecified chronic kidney disease: Secondary | ICD-10-CM | POA: Diagnosis not present

## 2020-10-26 DIAGNOSIS — S91102A Unspecified open wound of left great toe without damage to nail, initial encounter: Secondary | ICD-10-CM | POA: Diagnosis not present

## 2020-10-26 DIAGNOSIS — M19072 Primary osteoarthritis, left ankle and foot: Secondary | ICD-10-CM | POA: Diagnosis not present

## 2020-10-26 DIAGNOSIS — E1043 Type 1 diabetes mellitus with diabetic autonomic (poly)neuropathy: Secondary | ICD-10-CM | POA: Diagnosis not present

## 2020-10-26 DIAGNOSIS — D631 Anemia in chronic kidney disease: Secondary | ICD-10-CM | POA: Diagnosis not present

## 2020-10-26 DIAGNOSIS — M86272 Subacute osteomyelitis, left ankle and foot: Secondary | ICD-10-CM | POA: Diagnosis not present

## 2020-10-26 DIAGNOSIS — L03032 Cellulitis of left toe: Secondary | ICD-10-CM | POA: Diagnosis not present

## 2020-10-26 DIAGNOSIS — I951 Orthostatic hypotension: Secondary | ICD-10-CM | POA: Diagnosis not present

## 2020-10-26 DIAGNOSIS — R7982 Elevated C-reactive protein (CRP): Secondary | ICD-10-CM | POA: Diagnosis not present

## 2020-10-26 DIAGNOSIS — Z79899 Other long term (current) drug therapy: Secondary | ICD-10-CM | POA: Diagnosis not present

## 2020-10-26 DIAGNOSIS — E10621 Type 1 diabetes mellitus with foot ulcer: Secondary | ICD-10-CM | POA: Diagnosis not present

## 2020-10-26 DIAGNOSIS — Z9119 Patient's noncompliance with other medical treatment and regimen: Secondary | ICD-10-CM | POA: Diagnosis not present

## 2020-10-26 DIAGNOSIS — E1065 Type 1 diabetes mellitus with hyperglycemia: Secondary | ICD-10-CM | POA: Diagnosis not present

## 2020-10-26 DIAGNOSIS — S91302A Unspecified open wound, left foot, initial encounter: Secondary | ICD-10-CM | POA: Diagnosis not present

## 2020-10-26 DIAGNOSIS — L97529 Non-pressure chronic ulcer of other part of left foot with unspecified severity: Secondary | ICD-10-CM | POA: Diagnosis not present

## 2020-10-26 DIAGNOSIS — E1069 Type 1 diabetes mellitus with other specified complication: Secondary | ICD-10-CM | POA: Diagnosis not present

## 2020-10-26 DIAGNOSIS — K3184 Gastroparesis: Secondary | ICD-10-CM | POA: Diagnosis not present

## 2020-10-26 DIAGNOSIS — S90922A Unspecified superficial injury of left foot, initial encounter: Secondary | ICD-10-CM | POA: Diagnosis not present

## 2020-10-26 DIAGNOSIS — Y999 Unspecified external cause status: Secondary | ICD-10-CM | POA: Diagnosis not present

## 2020-10-26 DIAGNOSIS — E878 Other disorders of electrolyte and fluid balance, not elsewhere classified: Secondary | ICD-10-CM | POA: Diagnosis not present

## 2020-10-26 DIAGNOSIS — R000 Tachycardia, unspecified: Secondary | ICD-10-CM | POA: Diagnosis not present

## 2020-10-26 DIAGNOSIS — K219 Gastro-esophageal reflux disease without esophagitis: Secondary | ICD-10-CM | POA: Diagnosis not present

## 2020-10-26 DIAGNOSIS — E1022 Type 1 diabetes mellitus with diabetic chronic kidney disease: Secondary | ICD-10-CM | POA: Diagnosis not present

## 2020-10-26 DIAGNOSIS — E1042 Type 1 diabetes mellitus with diabetic polyneuropathy: Secondary | ICD-10-CM | POA: Diagnosis not present

## 2020-10-26 DIAGNOSIS — Z993 Dependence on wheelchair: Secondary | ICD-10-CM | POA: Diagnosis not present

## 2020-10-27 DIAGNOSIS — L97529 Non-pressure chronic ulcer of other part of left foot with unspecified severity: Secondary | ICD-10-CM | POA: Diagnosis not present

## 2020-10-27 DIAGNOSIS — I959 Hypotension, unspecified: Secondary | ICD-10-CM | POA: Diagnosis not present

## 2020-10-27 DIAGNOSIS — E162 Hypoglycemia, unspecified: Secondary | ICD-10-CM | POA: Diagnosis not present

## 2020-10-27 DIAGNOSIS — E11628 Type 2 diabetes mellitus with other skin complications: Secondary | ICD-10-CM | POA: Diagnosis not present

## 2020-10-27 DIAGNOSIS — N183 Chronic kidney disease, stage 3 unspecified: Secondary | ICD-10-CM | POA: Diagnosis not present

## 2020-10-28 DIAGNOSIS — E11621 Type 2 diabetes mellitus with foot ulcer: Secondary | ICD-10-CM | POA: Diagnosis not present

## 2020-10-28 DIAGNOSIS — L97529 Non-pressure chronic ulcer of other part of left foot with unspecified severity: Secondary | ICD-10-CM | POA: Diagnosis not present

## 2020-10-28 DIAGNOSIS — E162 Hypoglycemia, unspecified: Secondary | ICD-10-CM | POA: Diagnosis not present

## 2020-10-28 DIAGNOSIS — E11628 Type 2 diabetes mellitus with other skin complications: Secondary | ICD-10-CM | POA: Diagnosis not present

## 2020-10-28 DIAGNOSIS — I959 Hypotension, unspecified: Secondary | ICD-10-CM | POA: Diagnosis not present

## 2020-10-28 DIAGNOSIS — N183 Chronic kidney disease, stage 3 unspecified: Secondary | ICD-10-CM | POA: Diagnosis not present

## 2020-10-29 DIAGNOSIS — L97529 Non-pressure chronic ulcer of other part of left foot with unspecified severity: Secondary | ICD-10-CM | POA: Diagnosis not present

## 2020-10-29 DIAGNOSIS — M86172 Other acute osteomyelitis, left ankle and foot: Secondary | ICD-10-CM | POA: Diagnosis not present

## 2020-10-29 DIAGNOSIS — N1831 Chronic kidney disease, stage 3a: Secondary | ICD-10-CM | POA: Diagnosis not present

## 2020-10-29 DIAGNOSIS — E162 Hypoglycemia, unspecified: Secondary | ICD-10-CM | POA: Diagnosis not present

## 2020-10-29 DIAGNOSIS — I959 Hypotension, unspecified: Secondary | ICD-10-CM | POA: Diagnosis not present

## 2020-10-29 DIAGNOSIS — E11628 Type 2 diabetes mellitus with other skin complications: Secondary | ICD-10-CM | POA: Diagnosis not present

## 2020-10-29 DIAGNOSIS — R600 Localized edema: Secondary | ICD-10-CM | POA: Diagnosis not present

## 2020-10-29 DIAGNOSIS — N183 Chronic kidney disease, stage 3 unspecified: Secondary | ICD-10-CM | POA: Diagnosis not present

## 2020-10-29 DIAGNOSIS — E10621 Type 1 diabetes mellitus with foot ulcer: Secondary | ICD-10-CM | POA: Diagnosis not present

## 2020-10-29 DIAGNOSIS — M19072 Primary osteoarthritis, left ankle and foot: Secondary | ICD-10-CM | POA: Diagnosis not present

## 2020-10-29 DIAGNOSIS — E11621 Type 2 diabetes mellitus with foot ulcer: Secondary | ICD-10-CM | POA: Diagnosis not present

## 2020-10-29 DIAGNOSIS — N179 Acute kidney failure, unspecified: Secondary | ICD-10-CM | POA: Diagnosis not present

## 2020-10-29 DIAGNOSIS — K3184 Gastroparesis: Secondary | ICD-10-CM | POA: Diagnosis not present

## 2020-10-29 LAB — CBC: RBC: 2.48 — AB (ref 3.87–5.11)

## 2020-10-29 LAB — CBC AND DIFFERENTIAL
HCT: 22 — AB (ref 36–46)
Hemoglobin: 7.2 — AB (ref 12.0–16.0)
Platelets: 206 (ref 150–399)
WBC: 5.2

## 2020-10-29 LAB — BASIC METABOLIC PANEL
BUN: 19 (ref 4–21)
CO2: 25 — AB (ref 13–22)
Chloride: 103 (ref 99–108)
Creatinine: 1.5 — AB (ref 0.5–1.1)
Glucose: 194
Potassium: 3.8 (ref 3.4–5.3)
Sodium: 133 — AB (ref 137–147)

## 2020-10-29 LAB — COMPREHENSIVE METABOLIC PANEL: Calcium: 8.2 — AB (ref 8.7–10.7)

## 2020-10-30 DIAGNOSIS — N179 Acute kidney failure, unspecified: Secondary | ICD-10-CM | POA: Diagnosis not present

## 2020-10-30 DIAGNOSIS — I739 Peripheral vascular disease, unspecified: Secondary | ICD-10-CM | POA: Diagnosis not present

## 2020-10-30 DIAGNOSIS — N183 Chronic kidney disease, stage 3 unspecified: Secondary | ICD-10-CM | POA: Diagnosis not present

## 2020-10-30 DIAGNOSIS — E11621 Type 2 diabetes mellitus with foot ulcer: Secondary | ICD-10-CM | POA: Diagnosis not present

## 2020-10-30 DIAGNOSIS — N1831 Chronic kidney disease, stage 3a: Secondary | ICD-10-CM | POA: Diagnosis not present

## 2020-10-30 DIAGNOSIS — Z794 Long term (current) use of insulin: Secondary | ICD-10-CM | POA: Diagnosis not present

## 2020-10-30 DIAGNOSIS — I951 Orthostatic hypotension: Secondary | ICD-10-CM | POA: Diagnosis not present

## 2020-10-30 DIAGNOSIS — E10621 Type 1 diabetes mellitus with foot ulcer: Secondary | ICD-10-CM | POA: Diagnosis not present

## 2020-10-30 DIAGNOSIS — K3184 Gastroparesis: Secondary | ICD-10-CM | POA: Diagnosis not present

## 2020-10-30 DIAGNOSIS — Z89511 Acquired absence of right leg below knee: Secondary | ICD-10-CM | POA: Diagnosis not present

## 2020-10-30 DIAGNOSIS — Z993 Dependence on wheelchair: Secondary | ICD-10-CM | POA: Diagnosis not present

## 2020-10-30 DIAGNOSIS — E1043 Type 1 diabetes mellitus with diabetic autonomic (poly)neuropathy: Secondary | ICD-10-CM | POA: Diagnosis not present

## 2020-10-30 DIAGNOSIS — L97529 Non-pressure chronic ulcer of other part of left foot with unspecified severity: Secondary | ICD-10-CM | POA: Diagnosis not present

## 2020-10-30 DIAGNOSIS — M86172 Other acute osteomyelitis, left ankle and foot: Secondary | ICD-10-CM | POA: Diagnosis not present

## 2020-10-31 DIAGNOSIS — I70262 Atherosclerosis of native arteries of extremities with gangrene, left leg: Secondary | ICD-10-CM | POA: Diagnosis not present

## 2020-10-31 DIAGNOSIS — I739 Peripheral vascular disease, unspecified: Secondary | ICD-10-CM | POA: Diagnosis not present

## 2020-10-31 DIAGNOSIS — E1152 Type 2 diabetes mellitus with diabetic peripheral angiopathy with gangrene: Secondary | ICD-10-CM | POA: Diagnosis not present

## 2020-10-31 DIAGNOSIS — M86172 Other acute osteomyelitis, left ankle and foot: Secondary | ICD-10-CM | POA: Diagnosis not present

## 2020-10-31 DIAGNOSIS — Z89511 Acquired absence of right leg below knee: Secondary | ICD-10-CM | POA: Diagnosis not present

## 2020-10-31 DIAGNOSIS — Z89412 Acquired absence of left great toe: Secondary | ICD-10-CM | POA: Diagnosis not present

## 2020-10-31 DIAGNOSIS — E1043 Type 1 diabetes mellitus with diabetic autonomic (poly)neuropathy: Secondary | ICD-10-CM | POA: Diagnosis not present

## 2020-10-31 DIAGNOSIS — N179 Acute kidney failure, unspecified: Secondary | ICD-10-CM | POA: Diagnosis not present

## 2020-10-31 DIAGNOSIS — K3184 Gastroparesis: Secondary | ICD-10-CM | POA: Diagnosis not present

## 2020-10-31 DIAGNOSIS — N1831 Chronic kidney disease, stage 3a: Secondary | ICD-10-CM | POA: Diagnosis not present

## 2020-10-31 DIAGNOSIS — Z794 Long term (current) use of insulin: Secondary | ICD-10-CM | POA: Diagnosis not present

## 2020-10-31 DIAGNOSIS — I951 Orthostatic hypotension: Secondary | ICD-10-CM | POA: Diagnosis not present

## 2020-10-31 DIAGNOSIS — N183 Chronic kidney disease, stage 3 unspecified: Secondary | ICD-10-CM | POA: Diagnosis not present

## 2020-10-31 DIAGNOSIS — M19072 Primary osteoarthritis, left ankle and foot: Secondary | ICD-10-CM | POA: Diagnosis not present

## 2020-10-31 DIAGNOSIS — E11621 Type 2 diabetes mellitus with foot ulcer: Secondary | ICD-10-CM | POA: Diagnosis not present

## 2020-10-31 DIAGNOSIS — L97529 Non-pressure chronic ulcer of other part of left foot with unspecified severity: Secondary | ICD-10-CM | POA: Diagnosis not present

## 2020-10-31 DIAGNOSIS — E10621 Type 1 diabetes mellitus with foot ulcer: Secondary | ICD-10-CM | POA: Diagnosis not present

## 2020-10-31 DIAGNOSIS — M86272 Subacute osteomyelitis, left ankle and foot: Secondary | ICD-10-CM | POA: Diagnosis not present

## 2020-10-31 DIAGNOSIS — I96 Gangrene, not elsewhere classified: Secondary | ICD-10-CM | POA: Diagnosis not present

## 2020-10-31 DIAGNOSIS — L97524 Non-pressure chronic ulcer of other part of left foot with necrosis of bone: Secondary | ICD-10-CM | POA: Diagnosis not present

## 2020-11-01 DIAGNOSIS — E11621 Type 2 diabetes mellitus with foot ulcer: Secondary | ICD-10-CM | POA: Diagnosis not present

## 2020-11-01 DIAGNOSIS — Z89511 Acquired absence of right leg below knee: Secondary | ICD-10-CM | POA: Diagnosis not present

## 2020-11-01 DIAGNOSIS — E1043 Type 1 diabetes mellitus with diabetic autonomic (poly)neuropathy: Secondary | ICD-10-CM | POA: Diagnosis not present

## 2020-11-01 DIAGNOSIS — K3184 Gastroparesis: Secondary | ICD-10-CM | POA: Diagnosis not present

## 2020-11-01 DIAGNOSIS — N1831 Chronic kidney disease, stage 3a: Secondary | ICD-10-CM | POA: Diagnosis not present

## 2020-11-01 DIAGNOSIS — E10621 Type 1 diabetes mellitus with foot ulcer: Secondary | ICD-10-CM | POA: Diagnosis not present

## 2020-11-01 DIAGNOSIS — L97529 Non-pressure chronic ulcer of other part of left foot with unspecified severity: Secondary | ICD-10-CM | POA: Diagnosis not present

## 2020-11-01 DIAGNOSIS — I951 Orthostatic hypotension: Secondary | ICD-10-CM | POA: Diagnosis not present

## 2020-11-01 DIAGNOSIS — Z993 Dependence on wheelchair: Secondary | ICD-10-CM | POA: Diagnosis not present

## 2020-11-01 DIAGNOSIS — I739 Peripheral vascular disease, unspecified: Secondary | ICD-10-CM | POA: Diagnosis not present

## 2020-11-01 DIAGNOSIS — Z794 Long term (current) use of insulin: Secondary | ICD-10-CM | POA: Diagnosis not present

## 2020-11-01 DIAGNOSIS — N179 Acute kidney failure, unspecified: Secondary | ICD-10-CM | POA: Diagnosis not present

## 2020-11-01 DIAGNOSIS — M86172 Other acute osteomyelitis, left ankle and foot: Secondary | ICD-10-CM | POA: Diagnosis not present

## 2020-11-01 DIAGNOSIS — N183 Chronic kidney disease, stage 3 unspecified: Secondary | ICD-10-CM | POA: Diagnosis not present

## 2020-11-02 DIAGNOSIS — Z993 Dependence on wheelchair: Secondary | ICD-10-CM | POA: Diagnosis not present

## 2020-11-02 DIAGNOSIS — D631 Anemia in chronic kidney disease: Secondary | ICD-10-CM | POA: Diagnosis not present

## 2020-11-02 DIAGNOSIS — Z452 Encounter for adjustment and management of vascular access device: Secondary | ICD-10-CM | POA: Diagnosis not present

## 2020-11-02 DIAGNOSIS — M86272 Subacute osteomyelitis, left ankle and foot: Secondary | ICD-10-CM | POA: Diagnosis not present

## 2020-11-02 DIAGNOSIS — I129 Hypertensive chronic kidney disease with stage 1 through stage 4 chronic kidney disease, or unspecified chronic kidney disease: Secondary | ICD-10-CM | POA: Diagnosis not present

## 2020-11-02 DIAGNOSIS — E1043 Type 1 diabetes mellitus with diabetic autonomic (poly)neuropathy: Secondary | ICD-10-CM | POA: Diagnosis not present

## 2020-11-02 DIAGNOSIS — N183 Chronic kidney disease, stage 3 unspecified: Secondary | ICD-10-CM | POA: Diagnosis not present

## 2020-11-02 DIAGNOSIS — Z89511 Acquired absence of right leg below knee: Secondary | ICD-10-CM | POA: Diagnosis not present

## 2020-11-02 DIAGNOSIS — E10621 Type 1 diabetes mellitus with foot ulcer: Secondary | ICD-10-CM | POA: Diagnosis not present

## 2020-11-02 DIAGNOSIS — N179 Acute kidney failure, unspecified: Secondary | ICD-10-CM | POA: Diagnosis not present

## 2020-11-02 DIAGNOSIS — M86172 Other acute osteomyelitis, left ankle and foot: Secondary | ICD-10-CM | POA: Diagnosis not present

## 2020-11-02 DIAGNOSIS — L97529 Non-pressure chronic ulcer of other part of left foot with unspecified severity: Secondary | ICD-10-CM | POA: Diagnosis not present

## 2020-11-02 DIAGNOSIS — N1831 Chronic kidney disease, stage 3a: Secondary | ICD-10-CM | POA: Diagnosis not present

## 2020-11-02 DIAGNOSIS — E11621 Type 2 diabetes mellitus with foot ulcer: Secondary | ICD-10-CM | POA: Diagnosis not present

## 2020-11-02 LAB — BASIC METABOLIC PANEL
BUN: 25 — AB (ref 4–21)
CO2: 24 — AB (ref 13–22)
Chloride: 106 (ref 99–108)
Creatinine: 1.4 — AB (ref 0.5–1.1)
Glucose: 186
Potassium: 4.4 (ref 3.4–5.3)
Sodium: 135 — AB (ref 137–147)

## 2020-11-02 LAB — CBC AND DIFFERENTIAL
HCT: 20 — AB (ref 36–46)
Hemoglobin: 6.7 — AB (ref 12.0–16.0)
Neutrophils Absolute: 3.9
Platelets: 317 (ref 150–399)
WBC: 6.2

## 2020-11-02 LAB — COMPREHENSIVE METABOLIC PANEL
Albumin: 3.3 — AB (ref 3.5–5.0)
Calcium: 8.4 — AB (ref 8.7–10.7)
GFR calc Af Amer: 52

## 2020-11-02 LAB — HEPATIC FUNCTION PANEL
ALT: 23 (ref 7–35)
AST: 28 (ref 13–35)
Alkaline Phosphatase: 113 (ref 25–125)
Bilirubin, Direct: 0.5 — AB (ref 0.01–0.4)
Bilirubin, Total: 0.1

## 2020-11-02 LAB — CBC: RBC: 2.26 — AB (ref 3.87–5.11)

## 2020-11-03 ENCOUNTER — Emergency Department (HOSPITAL_COMMUNITY): Payer: Medicaid Other

## 2020-11-03 ENCOUNTER — Encounter (HOSPITAL_COMMUNITY): Payer: Self-pay

## 2020-11-03 ENCOUNTER — Other Ambulatory Visit: Payer: Self-pay

## 2020-11-03 ENCOUNTER — Inpatient Hospital Stay (HOSPITAL_COMMUNITY)
Admission: EM | Admit: 2020-11-03 | Discharge: 2020-11-06 | DRG: 637 | Disposition: A | Discharge status: Home / Self Care | Payer: Medicaid Other | Attending: Internal Medicine | Admitting: Internal Medicine

## 2020-11-03 DIAGNOSIS — E10628 Type 1 diabetes mellitus with other skin complications: Secondary | ICD-10-CM | POA: Diagnosis not present

## 2020-11-03 DIAGNOSIS — M869 Osteomyelitis, unspecified: Secondary | ICD-10-CM | POA: Diagnosis present

## 2020-11-03 DIAGNOSIS — R4020 Unspecified coma: Secondary | ICD-10-CM | POA: Diagnosis not present

## 2020-11-03 DIAGNOSIS — E1065 Type 1 diabetes mellitus with hyperglycemia: Secondary | ICD-10-CM | POA: Diagnosis present

## 2020-11-03 DIAGNOSIS — M86272 Subacute osteomyelitis, left ankle and foot: Secondary | ICD-10-CM | POA: Diagnosis not present

## 2020-11-03 DIAGNOSIS — Y95 Nosocomial condition: Secondary | ICD-10-CM | POA: Diagnosis present

## 2020-11-03 DIAGNOSIS — Z794 Long term (current) use of insulin: Secondary | ICD-10-CM

## 2020-11-03 DIAGNOSIS — M86171 Other acute osteomyelitis, right ankle and foot: Secondary | ICD-10-CM | POA: Diagnosis not present

## 2020-11-03 DIAGNOSIS — R404 Transient alteration of awareness: Secondary | ICD-10-CM | POA: Diagnosis not present

## 2020-11-03 DIAGNOSIS — A419 Sepsis, unspecified organism: Secondary | ICD-10-CM

## 2020-11-03 DIAGNOSIS — D631 Anemia in chronic kidney disease: Secondary | ICD-10-CM | POA: Diagnosis present

## 2020-11-03 DIAGNOSIS — Z833 Family history of diabetes mellitus: Secondary | ICD-10-CM

## 2020-11-03 DIAGNOSIS — F1290 Cannabis use, unspecified, uncomplicated: Secondary | ICD-10-CM | POA: Diagnosis present

## 2020-11-03 DIAGNOSIS — Z87440 Personal history of urinary (tract) infections: Secondary | ICD-10-CM

## 2020-11-03 DIAGNOSIS — R1116 Cannabis hyperemesis syndrome: Secondary | ICD-10-CM | POA: Diagnosis present

## 2020-11-03 DIAGNOSIS — E109 Type 1 diabetes mellitus without complications: Secondary | ICD-10-CM | POA: Diagnosis present

## 2020-11-03 DIAGNOSIS — Z79899 Other long term (current) drug therapy: Secondary | ICD-10-CM

## 2020-11-03 DIAGNOSIS — E1069 Type 1 diabetes mellitus with other specified complication: Secondary | ICD-10-CM | POA: Diagnosis present

## 2020-11-03 DIAGNOSIS — M797 Fibromyalgia: Secondary | ICD-10-CM | POA: Diagnosis present

## 2020-11-03 DIAGNOSIS — N183 Chronic kidney disease, stage 3 unspecified: Secondary | ICD-10-CM | POA: Diagnosis present

## 2020-11-03 DIAGNOSIS — J80 Acute respiratory distress syndrome: Secondary | ICD-10-CM | POA: Diagnosis not present

## 2020-11-03 DIAGNOSIS — E10649 Type 1 diabetes mellitus with hypoglycemia without coma: Principal | ICD-10-CM | POA: Diagnosis present

## 2020-11-03 DIAGNOSIS — Z9049 Acquired absence of other specified parts of digestive tract: Secondary | ICD-10-CM

## 2020-11-03 DIAGNOSIS — T68XXXA Hypothermia, initial encounter: Secondary | ICD-10-CM

## 2020-11-03 DIAGNOSIS — N1832 Chronic kidney disease, stage 3b: Secondary | ICD-10-CM | POA: Diagnosis present

## 2020-11-03 DIAGNOSIS — I152 Hypertension secondary to endocrine disorders: Secondary | ICD-10-CM | POA: Diagnosis present

## 2020-11-03 DIAGNOSIS — R0902 Hypoxemia: Secondary | ICD-10-CM | POA: Diagnosis not present

## 2020-11-03 DIAGNOSIS — Z800 Family history of malignant neoplasm of digestive organs: Secondary | ICD-10-CM

## 2020-11-03 DIAGNOSIS — E162 Hypoglycemia, unspecified: Secondary | ICD-10-CM | POA: Diagnosis not present

## 2020-11-03 DIAGNOSIS — J189 Pneumonia, unspecified organism: Secondary | ICD-10-CM | POA: Diagnosis not present

## 2020-11-03 DIAGNOSIS — R680 Hypothermia, not associated with low environmental temperature: Secondary | ICD-10-CM | POA: Diagnosis present

## 2020-11-03 DIAGNOSIS — E161 Other hypoglycemia: Secondary | ICD-10-CM | POA: Diagnosis not present

## 2020-11-03 DIAGNOSIS — K219 Gastro-esophageal reflux disease without esophagitis: Secondary | ICD-10-CM | POA: Diagnosis present

## 2020-11-03 DIAGNOSIS — F419 Anxiety disorder, unspecified: Secondary | ICD-10-CM | POA: Diagnosis present

## 2020-11-03 DIAGNOSIS — D649 Anemia, unspecified: Secondary | ICD-10-CM | POA: Diagnosis present

## 2020-11-03 DIAGNOSIS — Z89422 Acquired absence of other left toe(s): Secondary | ICD-10-CM | POA: Diagnosis not present

## 2020-11-03 DIAGNOSIS — R112 Nausea with vomiting, unspecified: Secondary | ICD-10-CM | POA: Diagnosis present

## 2020-11-03 DIAGNOSIS — Z8249 Family history of ischemic heart disease and other diseases of the circulatory system: Secondary | ICD-10-CM

## 2020-11-03 DIAGNOSIS — E1022 Type 1 diabetes mellitus with diabetic chronic kidney disease: Secondary | ICD-10-CM | POA: Diagnosis present

## 2020-11-03 DIAGNOSIS — M19072 Primary osteoarthritis, left ankle and foot: Secondary | ICD-10-CM | POA: Diagnosis not present

## 2020-11-03 DIAGNOSIS — L02611 Cutaneous abscess of right foot: Secondary | ICD-10-CM | POA: Diagnosis not present

## 2020-11-03 DIAGNOSIS — I129 Hypertensive chronic kidney disease with stage 1 through stage 4 chronic kidney disease, or unspecified chronic kidney disease: Secondary | ICD-10-CM | POA: Diagnosis present

## 2020-11-03 DIAGNOSIS — F32A Depression, unspecified: Secondary | ICD-10-CM | POA: Diagnosis present

## 2020-11-03 DIAGNOSIS — E1043 Type 1 diabetes mellitus with diabetic autonomic (poly)neuropathy: Secondary | ICD-10-CM | POA: Diagnosis present

## 2020-11-03 DIAGNOSIS — E11649 Type 2 diabetes mellitus with hypoglycemia without coma: Secondary | ICD-10-CM | POA: Diagnosis not present

## 2020-11-03 DIAGNOSIS — Z20822 Contact with and (suspected) exposure to covid-19: Secondary | ICD-10-CM | POA: Diagnosis present

## 2020-11-03 DIAGNOSIS — I10 Essential (primary) hypertension: Secondary | ICD-10-CM | POA: Diagnosis not present

## 2020-11-03 DIAGNOSIS — Z89412 Acquired absence of left great toe: Secondary | ICD-10-CM

## 2020-11-03 DIAGNOSIS — S91302A Unspecified open wound, left foot, initial encounter: Secondary | ICD-10-CM | POA: Diagnosis not present

## 2020-11-03 DIAGNOSIS — K3184 Gastroparesis: Secondary | ICD-10-CM | POA: Diagnosis present

## 2020-11-03 DIAGNOSIS — E1159 Type 2 diabetes mellitus with other circulatory complications: Secondary | ICD-10-CM | POA: Diagnosis present

## 2020-11-03 DIAGNOSIS — I96 Gangrene, not elsewhere classified: Secondary | ICD-10-CM | POA: Diagnosis not present

## 2020-11-03 DIAGNOSIS — Z803 Family history of malignant neoplasm of breast: Secondary | ICD-10-CM

## 2020-11-03 DIAGNOSIS — Z89511 Acquired absence of right leg below knee: Secondary | ICD-10-CM

## 2020-11-03 DIAGNOSIS — J188 Other pneumonia, unspecified organism: Secondary | ICD-10-CM | POA: Diagnosis present

## 2020-11-03 DIAGNOSIS — G9341 Metabolic encephalopathy: Secondary | ICD-10-CM | POA: Diagnosis present

## 2020-11-03 LAB — URINALYSIS, ROUTINE W REFLEX MICROSCOPIC
Bilirubin Urine: NEGATIVE
Glucose, UA: 50 mg/dL — AB
Ketones, ur: NEGATIVE mg/dL
Leukocytes,Ua: NEGATIVE
Nitrite: NEGATIVE
Protein, ur: >=300 mg/dL — AB
Specific Gravity, Urine: 1.018 (ref 1.005–1.030)
pH: 5 (ref 5.0–8.0)

## 2020-11-03 LAB — CBG MONITORING, ED
Glucose-Capillary: 42 mg/dL — CL (ref 70–99)
Glucose-Capillary: 69 mg/dL — ABNORMAL LOW (ref 70–99)
Glucose-Capillary: 64 mg/dL — ABNORMAL LOW (ref 70–99)
Glucose-Capillary: 71 mg/dL (ref 70–99)
Glucose-Capillary: 157 mg/dL — ABNORMAL HIGH (ref 70–99)
Glucose-Capillary: 170 mg/dL — ABNORMAL HIGH (ref 70–99)
Glucose-Capillary: 190 mg/dL — ABNORMAL HIGH (ref 70–99)

## 2020-11-03 LAB — CBC WITH DIFFERENTIAL/PLATELET
Abs Immature Granulocytes: 0.09 10*3/uL — ABNORMAL HIGH (ref 0.00–0.07)
Basophils Absolute: 0 10*3/uL (ref 0.0–0.1)
Basophils Relative: 0 %
Eosinophils Absolute: 0.1 10*3/uL (ref 0.0–0.5)
Eosinophils Relative: 1 %
HCT: 26.8 % — ABNORMAL LOW (ref 36.0–46.0)
Hemoglobin: 8.4 g/dL — ABNORMAL LOW (ref 12.0–15.0)
Immature Granulocytes: 1 %
Lymphocytes Relative: 10 %
Lymphs Abs: 1 10*3/uL (ref 0.7–4.0)
MCH: 29.7 pg (ref 26.0–34.0)
MCHC: 31.3 g/dL (ref 30.0–36.0)
MCV: 94.7 fL (ref 80.0–100.0)
Monocytes Absolute: 0.4 10*3/uL (ref 0.1–1.0)
Monocytes Relative: 4 %
Neutro Abs: 8.8 10*3/uL — ABNORMAL HIGH (ref 1.7–7.7)
Neutrophils Relative %: 84 %
Platelets: 270 10*3/uL (ref 150–400)
RBC: 2.83 MIL/uL — ABNORMAL LOW (ref 3.87–5.11)
RDW: 13.1 % (ref 11.5–15.5)
WBC: 10.4 10*3/uL (ref 4.0–10.5)
nRBC: 0 % (ref 0.0–0.2)

## 2020-11-03 LAB — PROTIME-INR
INR: 1 (ref 0.8–1.2)
Prothrombin Time: 12.7 seconds (ref 11.4–15.2)

## 2020-11-03 LAB — COMPREHENSIVE METABOLIC PANEL
ALT: 26 U/L (ref 0–44)
AST: 31 U/L (ref 15–41)
Albumin: 1.7 g/dL — ABNORMAL LOW (ref 3.5–5.0)
Alkaline Phosphatase: 76 U/L (ref 38–126)
Anion gap: 6 (ref 5–15)
BUN: 28 mg/dL — ABNORMAL HIGH (ref 6–20)
CO2: 21 mmol/L — ABNORMAL LOW (ref 22–32)
Calcium: 8.8 mg/dL — ABNORMAL LOW (ref 8.9–10.3)
Chloride: 110 mmol/L (ref 98–111)
Creatinine, Ser: 1.38 mg/dL — ABNORMAL HIGH (ref 0.44–1.00)
GFR, Estimated: 52 mL/min — ABNORMAL LOW (ref >60)
Glucose, Bld: 47 mg/dL — ABNORMAL LOW (ref 70–99)
Potassium: 4.2 mmol/L (ref 3.5–5.1)
Sodium: 137 mmol/L (ref 135–145)
Total Bilirubin: 0.5 mg/dL (ref 0.3–1.2)
Total Protein: 6.5 g/dL (ref 6.5–8.1)

## 2020-11-03 LAB — STREP PNEUMONIAE URINARY ANTIGEN: Strep Pneumo Urinary Antigen: NEGATIVE

## 2020-11-03 LAB — APTT: aPTT: 24 seconds (ref 24–36)

## 2020-11-03 LAB — RESP PANEL BY RT-PCR (FLU A&B, COVID) ARPGX2
Influenza A by PCR: NEGATIVE
Influenza B by PCR: NEGATIVE
SARS Coronavirus 2 by RT PCR: NEGATIVE

## 2020-11-03 LAB — I-STAT BETA HCG BLOOD, ED (MC, WL, AP ONLY): I-stat hCG, quantitative: <5 m[IU]/mL (ref <5)

## 2020-11-03 LAB — TSH: TSH: 1.472 u[IU]/mL (ref 0.350–4.500)

## 2020-11-03 LAB — LACTIC ACID, PLASMA: Lactic Acid, Venous: 0.6 mmol/L (ref 0.5–1.9)

## 2020-11-03 IMAGING — DX DG CHEST 1V PORT
1 series · 1 of 1 positions shown · non-contrast
Comparison: Chest x-ray [DATE].

CLINICAL DATA: 30-year-old female with sepsis. Found unresponsive
by husband.

EXAM:
PORTABLE CHEST 1 VIEW

[chest]
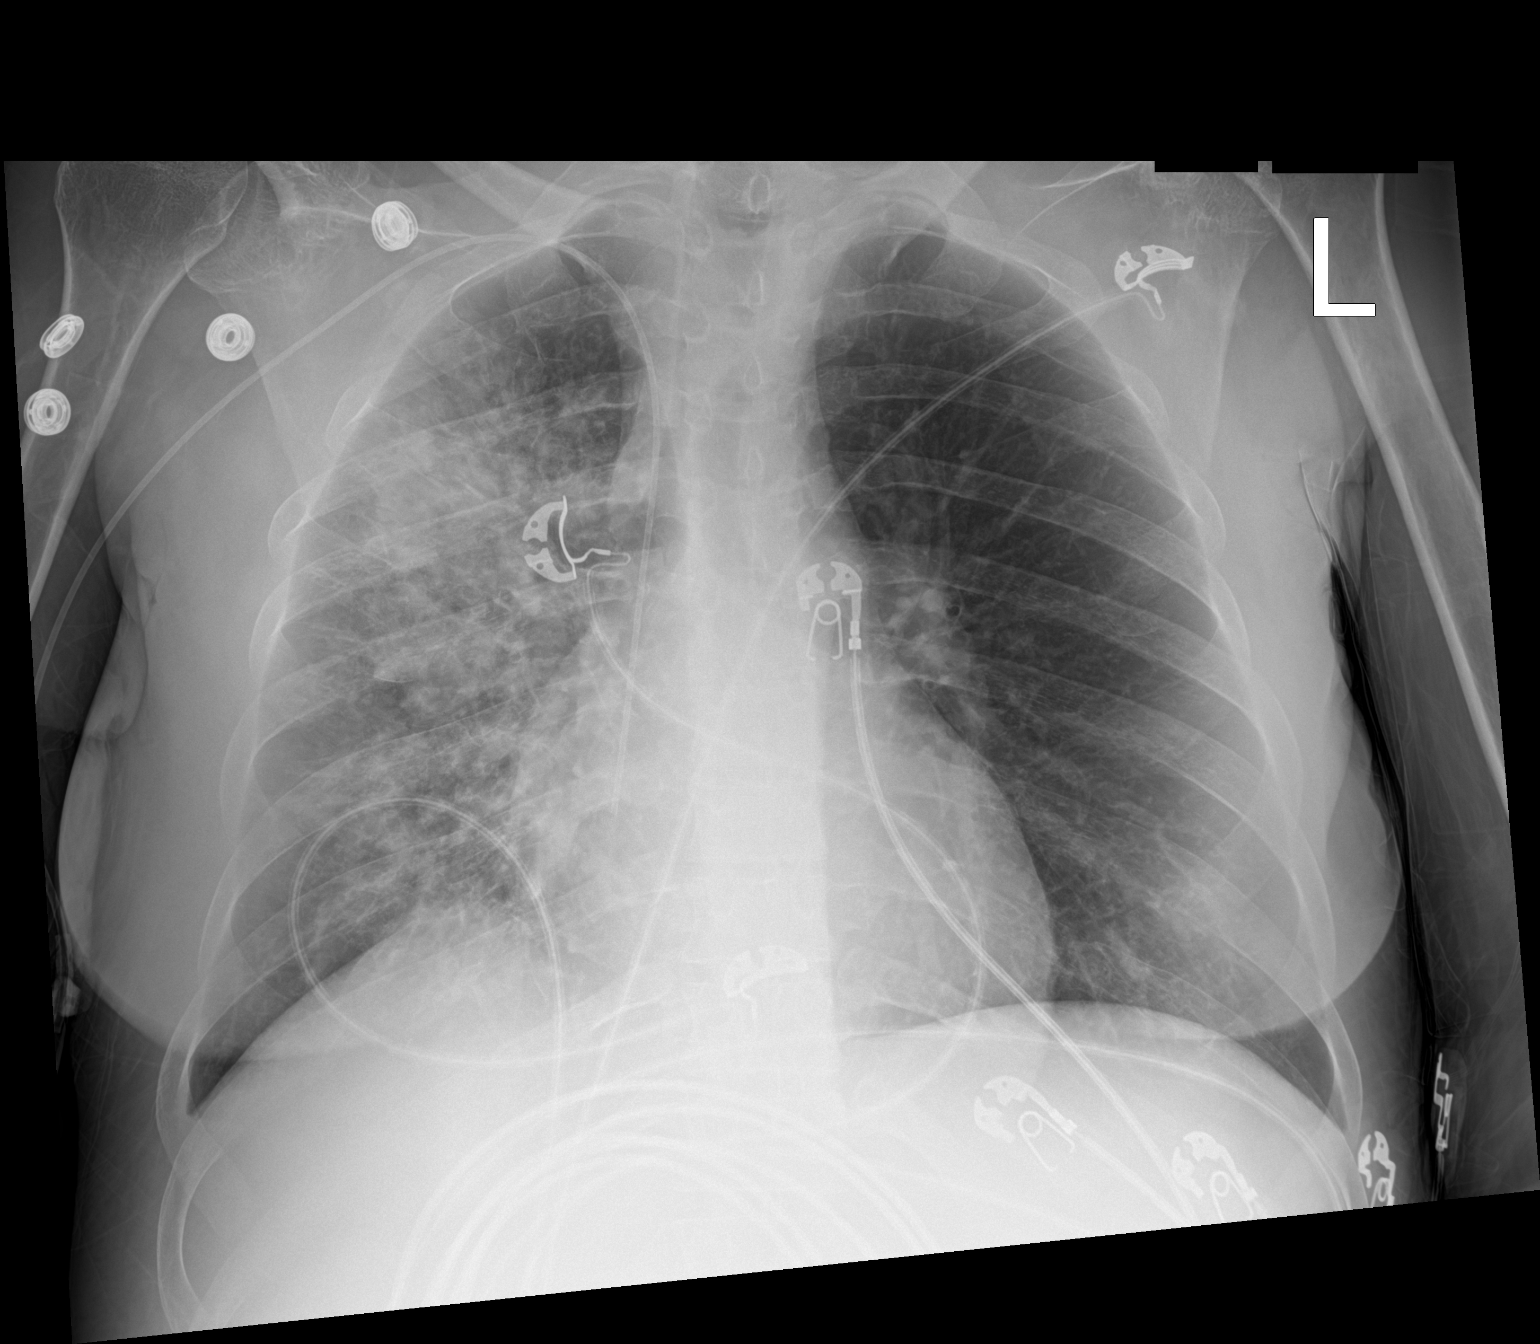

[1 of 1 positions shown; findings below may reference images not displayed]

FINDINGS: There is a right upper extremity PICC with tip terminating in the
right atrium. Patchy multifocal airspace consolidation throughout
the right lung, most confluent in the right upper lobe. Left lung
appears clear. No pleural effusions. No pneumothorax. No evidence of
pulmonary edema. Heart size is normal. Upper mediastinal contours
are within normal limits.
IMPRESSION: 1. Multilobar pneumonia in the right lung.
2. Tip of right upper extremity PICC is in the right atrium.

## 2020-11-03 IMAGING — DX DG FOOT COMPLETE 3+V*L*
2 series · 3 of 3 positions shown · non-contrast
Comparison: Left foot radiograph dated [DATE].

CLINICAL DATA: Recent amputation, concern for osteomyelitis. The
patient is hypoglycemic.

EXAM:
LEFT FOOT - COMPLETE 3+ VIEW

[Series 1: foot · 0.14mm/px · 2 of 2 slices shown]
[im 1/2]
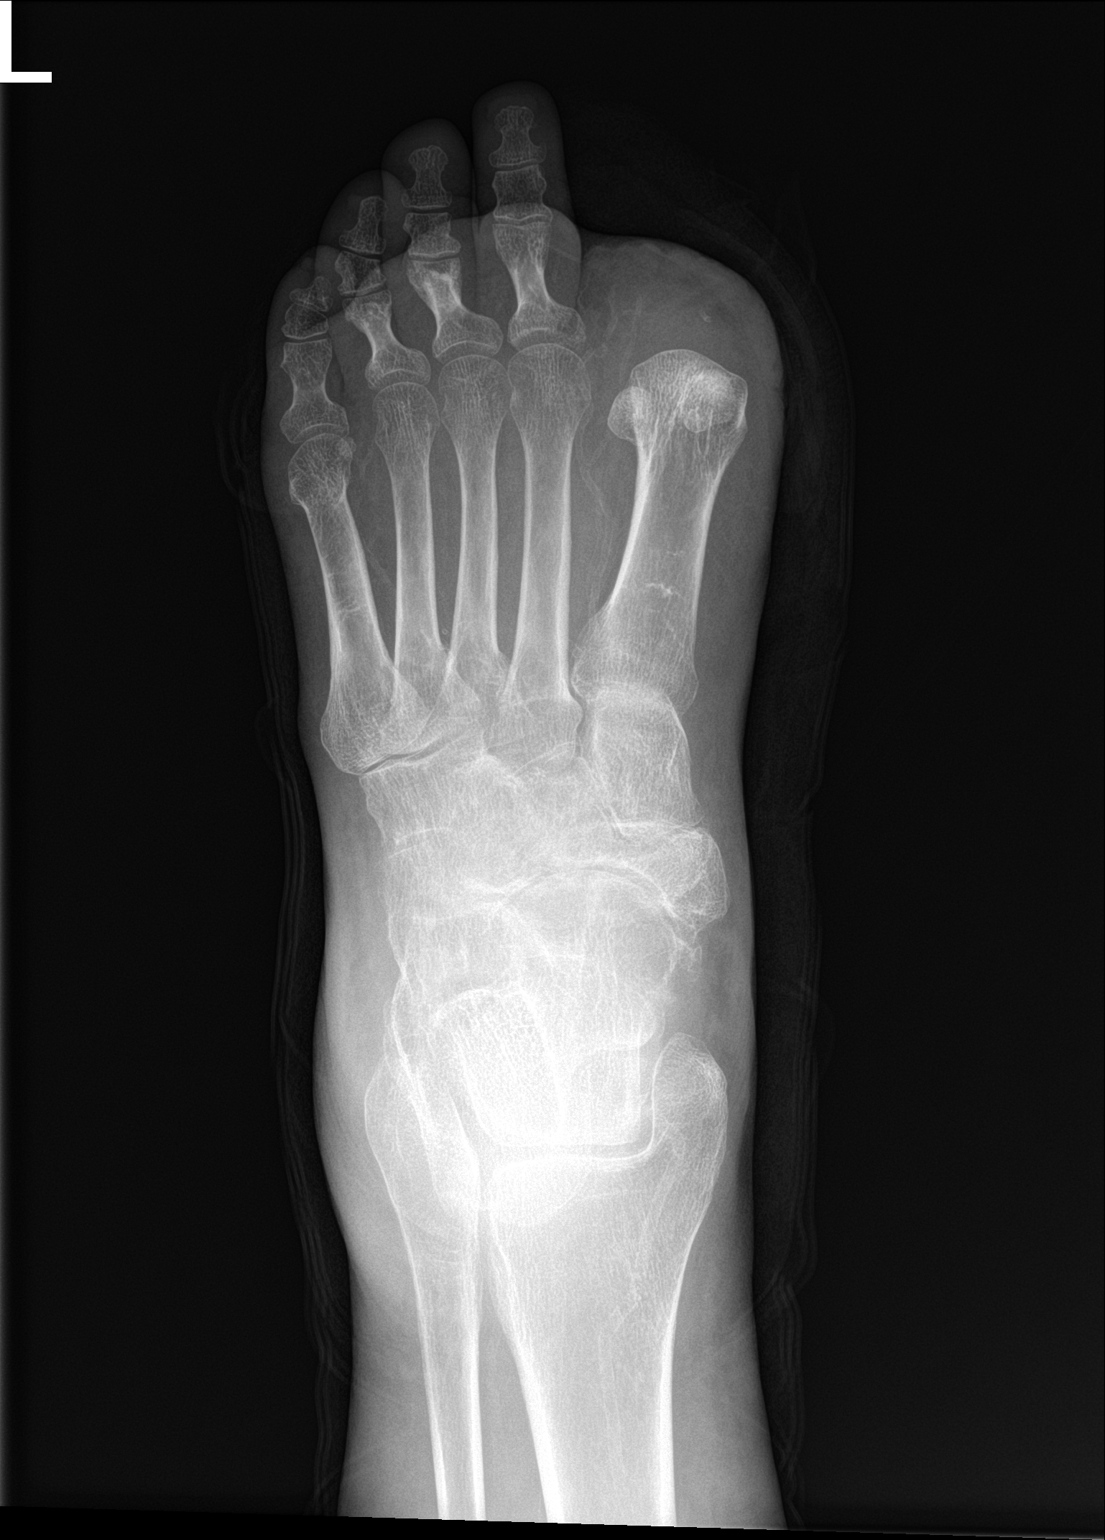
[im 2/2]
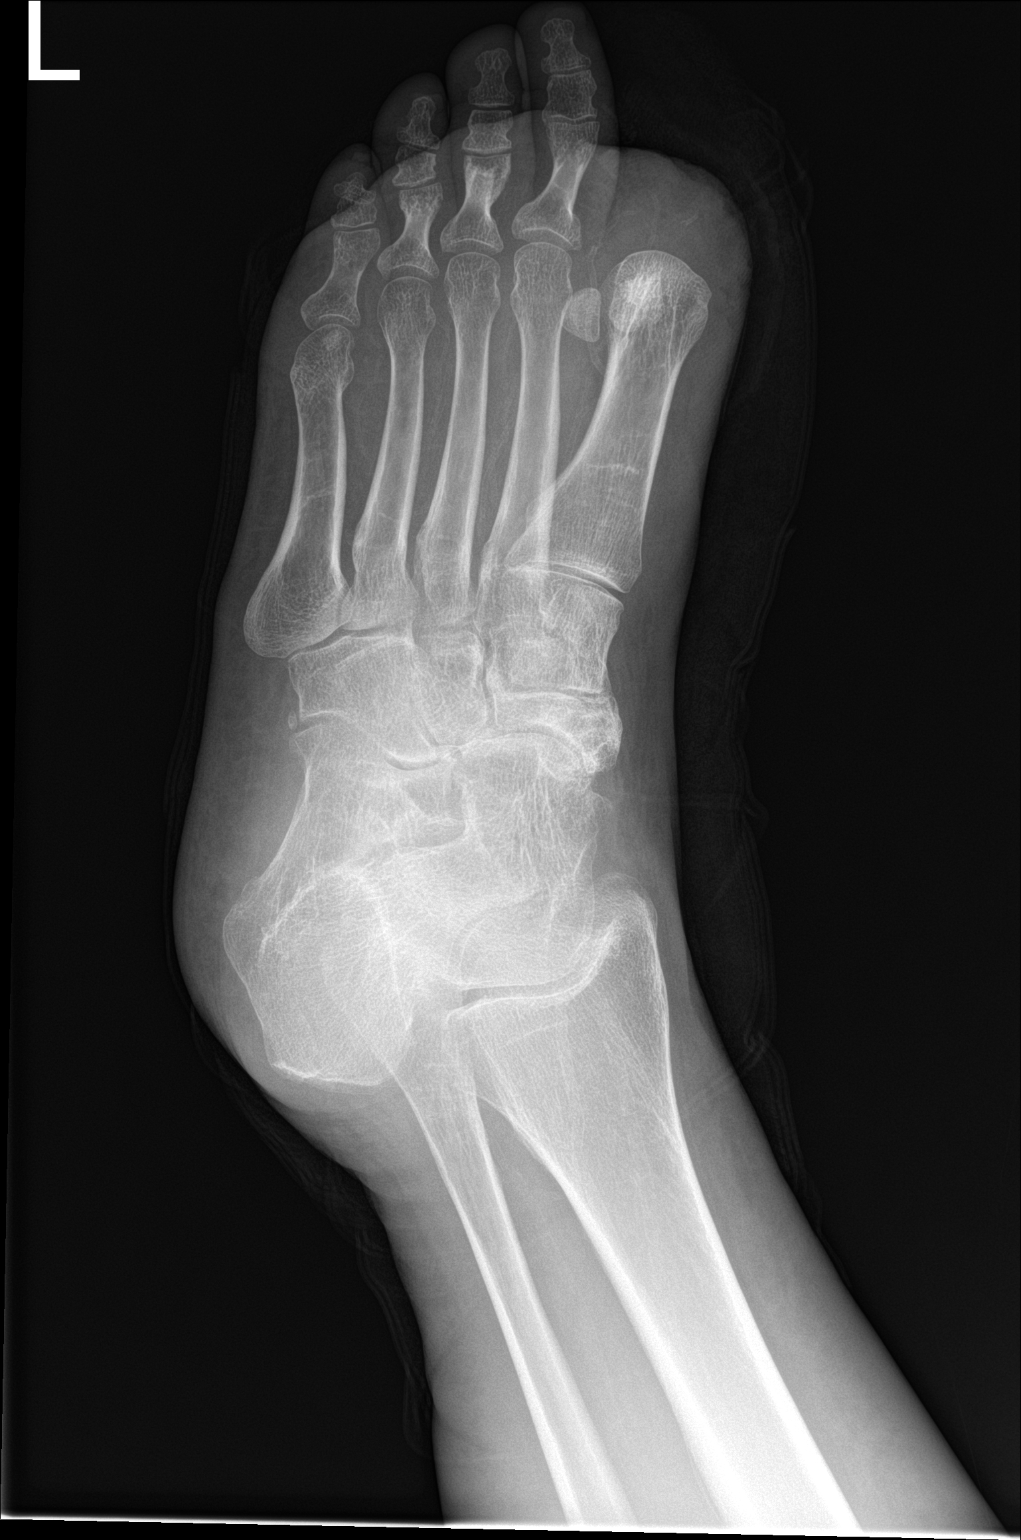

[leg]
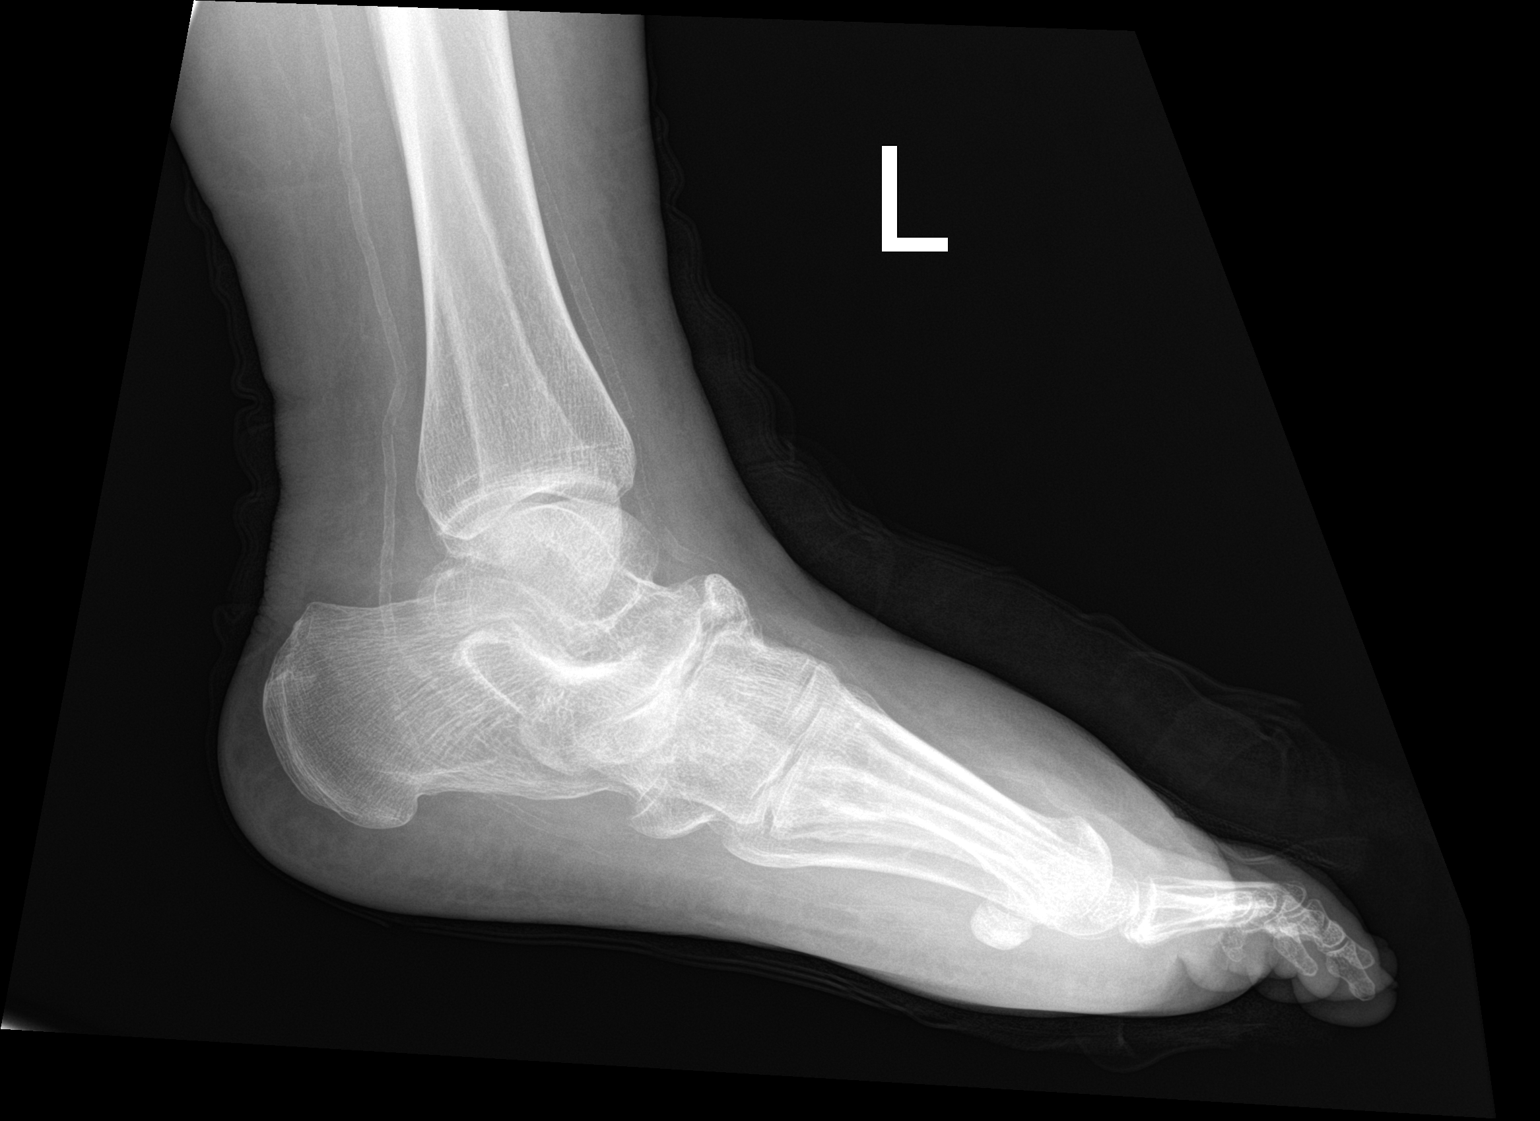

[3 of 3 positions shown; findings below may reference images not displayed]

FINDINGS: The patient is status post a first ray resection at the metatarsal
phalangeal joint. There is no evidence of acute fracture or
dislocation. No focal osseous demineralization to suggest
osteomyelitis. Degenerative changes involving the navicular bone are
redemonstrated. Soft tissues are unremarkable.
IMPRESSION: No evidence of osteomyelitis.

## 2020-11-03 MED ORDER — LACTATED RINGERS IV SOLN: INTRAVENOUS | Status: DC

## 2020-11-03 MED ORDER — HYDROMORPHONE HCL 1 MG/ML IJ SOLN
0.5000 mg | INTRAMUSCULAR | Status: DC | PRN
  Administered 2020-11-03 – 2020-11-06 (×17): 0.5 mg via INTRAVENOUS
  Filled 2020-11-03 (×17): qty 1

## 2020-11-03 MED ORDER — LORAZEPAM 0.5 MG PO TABS
0.5000 mg | ORAL_TABLET | Freq: Two times a day (BID) | ORAL | Status: DC
  Administered 2020-11-03 – 2020-11-06 (×6): 0.5 mg via ORAL
  Filled 2020-11-03 (×6): qty 1

## 2020-11-03 MED ORDER — DICYCLOMINE HCL 20 MG PO TABS
20.0000 mg | ORAL_TABLET | Freq: Three times a day (TID) | ORAL | Status: DC | PRN
  Administered 2020-11-03: 20 mg via ORAL
  Filled 2020-11-03 (×2): qty 1

## 2020-11-03 MED ORDER — POLYVINYL ALCOHOL 1.4 % OP SOLN: 1.0000 [drp] | Freq: Every day | OPHTHALMIC | Status: DC | PRN

## 2020-11-03 MED ORDER — DEXTROSE 10 % IV SOLN: INTRAVENOUS | Status: DC

## 2020-11-03 MED ORDER — PANTOPRAZOLE SODIUM 40 MG PO TBEC
40.0000 mg | DELAYED_RELEASE_TABLET | Freq: Two times a day (BID) | ORAL | Status: DC
  Administered 2020-11-03 – 2020-11-06 (×6): 40 mg via ORAL
  Filled 2020-11-03 (×6): qty 1

## 2020-11-03 MED ORDER — INSULIN ASPART 100 UNIT/ML IJ SOLN
0.0000 [IU] | Freq: Three times a day (TID) | INTRAMUSCULAR | Status: DC
  Administered 2020-11-04 – 2020-11-05 (×5): 1 [IU] via SUBCUTANEOUS

## 2020-11-03 MED ORDER — ENOXAPARIN SODIUM 40 MG/0.4ML IJ SOSY
40.0000 mg | PREFILLED_SYRINGE | INTRAMUSCULAR | Status: DC
  Administered 2020-11-03 – 2020-11-04 (×2): 40 mg via SUBCUTANEOUS
  Filled 2020-11-03 (×2): qty 0.4

## 2020-11-03 MED ORDER — DEXTROSE 50 % IV SOLN
1.0000 | Freq: Once | INTRAVENOUS | Status: AC
  Administered 2020-11-03: 50 mL via INTRAVENOUS

## 2020-11-03 MED ORDER — OXYCODONE HCL 5 MG PO TABS
5.0000 mg | ORAL_TABLET | ORAL | Status: DC | PRN
Start: 2020-11-03 — End: 2020-11-06
  Administered 2020-11-03 – 2020-11-06 (×8): 5 mg via ORAL
  Filled 2020-11-03 (×8): qty 1

## 2020-11-03 MED ORDER — HYDRALAZINE HCL 50 MG PO TABS
50.0000 mg | ORAL_TABLET | Freq: Two times a day (BID) | ORAL | Status: DC
  Administered 2020-11-04 – 2020-11-06 (×5): 50 mg via ORAL
  Filled 2020-11-03: qty 1
  Filled 2020-11-03: qty 2
  Filled 2020-11-03 (×3): qty 1

## 2020-11-03 MED ORDER — BUSPIRONE HCL 10 MG PO TABS
5.0000 mg | ORAL_TABLET | Freq: Three times a day (TID) | ORAL | Status: DC
  Administered 2020-11-03 – 2020-11-06 (×8): 5 mg via ORAL
  Filled 2020-11-03 (×8): qty 1

## 2020-11-03 MED ORDER — ONDANSETRON HCL 4 MG PO TABS: 4.0000 mg | ORAL_TABLET | Freq: Four times a day (QID) | ORAL | Status: DC | PRN

## 2020-11-03 MED ORDER — FENTANYL CITRATE (PF) 100 MCG/2ML IJ SOLN
50.0000 ug | Freq: Once | INTRAMUSCULAR | Status: AC
Start: 2020-11-03 — End: 2020-11-03
  Administered 2020-11-03: 50 ug via INTRAVENOUS
  Filled 2020-11-03: qty 2

## 2020-11-03 MED ORDER — SODIUM CHLORIDE 0.9 % IV SOLN
2.0000 g | Freq: Once | INTRAVENOUS | Status: AC
  Administered 2020-11-03: 2 g via INTRAVENOUS
  Filled 2020-11-03: qty 20

## 2020-11-03 MED ORDER — HYDROMORPHONE HCL 1 MG/ML IJ SOLN
0.5000 mg | Freq: Once | INTRAMUSCULAR | Status: AC
  Administered 2020-11-03: 0.5 mg via INTRAVENOUS
  Filled 2020-11-03: qty 1

## 2020-11-03 MED ORDER — SODIUM CHLORIDE 0.9 % IV SOLN
2.0000 g | Freq: Two times a day (BID) | INTRAVENOUS | Status: DC
  Administered 2020-11-03 – 2020-11-06 (×6): 2 g via INTRAVENOUS
  Filled 2020-11-03 (×7): qty 2

## 2020-11-03 MED ORDER — DEXTROSE-NACL 5-0.45 % IV SOLN: INTRAVENOUS | Status: DC

## 2020-11-03 MED ORDER — ONDANSETRON HCL 4 MG/2ML IJ SOLN: 4.0000 mg | Freq: Four times a day (QID) | INTRAMUSCULAR | Status: DC | PRN

## 2020-11-03 MED ORDER — MIRTAZAPINE 15 MG PO TABS
30.0000 mg | ORAL_TABLET | Freq: Every day | ORAL | Status: DC
  Administered 2020-11-03 – 2020-11-05 (×3): 30 mg via ORAL
  Filled 2020-11-03: qty 1
  Filled 2020-11-03 (×2): qty 2

## 2020-11-03 MED ORDER — METOCLOPRAMIDE HCL 5 MG PO TABS
5.0000 mg | ORAL_TABLET | Freq: Two times a day (BID) | ORAL | Status: DC
  Administered 2020-11-04 – 2020-11-06 (×5): 5 mg via ORAL
  Filled 2020-11-03 (×5): qty 1

## 2020-11-03 MED ORDER — SODIUM CHLORIDE 0.9% FLUSH: 10.0000 mL | INTRAVENOUS | Status: DC | PRN

## 2020-11-03 MED ORDER — SODIUM CHLORIDE 0.9 % IV SOLN: INTRAVENOUS | Status: DC

## 2020-11-03 MED ORDER — DEXTROSE 50 % IV SOLN
INTRAVENOUS | Status: AC
  Filled 2020-11-03: qty 50

## 2020-11-03 MED ORDER — VANCOMYCIN HCL 1250 MG/250ML IV SOLN
1250.0000 mg | Freq: Once | INTRAVENOUS | Status: AC
Start: 2020-11-03 — End: 2020-11-03
  Administered 2020-11-03: 1250 mg via INTRAVENOUS
  Filled 2020-11-03: qty 250

## 2020-11-03 MED ORDER — ACETAMINOPHEN 325 MG PO TABS: 650.0000 mg | ORAL_TABLET | Freq: Four times a day (QID) | ORAL | Status: DC | PRN

## 2020-11-03 MED ORDER — CHLORHEXIDINE GLUCONATE CLOTH 2 % EX PADS
6.0000 | MEDICATED_PAD | Freq: Every day | CUTANEOUS | Status: DC
  Administered 2020-11-05: 6 via TOPICAL

## 2020-11-03 MED ORDER — AMLODIPINE BESYLATE 10 MG PO TABS
10.0000 mg | ORAL_TABLET | Freq: Every day | ORAL | Status: DC
  Administered 2020-11-04 – 2020-11-06 (×3): 10 mg via ORAL
  Filled 2020-11-03 (×2): qty 1
  Filled 2020-11-03: qty 2

## 2020-11-03 MED ORDER — CARVEDILOL 12.5 MG PO TABS
12.5000 mg | ORAL_TABLET | Freq: Two times a day (BID) | ORAL | Status: DC
  Administered 2020-11-03 – 2020-11-06 (×6): 12.5 mg via ORAL
  Filled 2020-11-03 (×6): qty 1

## 2020-11-03 MED ORDER — SODIUM CHLORIDE 0.9% FLUSH
10.0000 mL | Freq: Two times a day (BID) | INTRAVENOUS | Status: DC
  Administered 2020-11-03 – 2020-11-06 (×5): 10 mL

## 2020-11-03 MED ORDER — HEPARIN SODIUM (PORCINE) 5000 UNIT/ML IJ SOLN: 5000.0000 [IU] | Freq: Three times a day (TID) | INTRAMUSCULAR | Status: DC

## 2020-11-03 MED ORDER — VANCOMYCIN HCL 1250 MG/250ML IV SOLN: 1250.0000 mg | INTRAVENOUS | Status: DC

## 2020-11-03 MED ORDER — INSULIN GLARGINE-YFGN 100 UNIT/ML ~~LOC~~ SOLN
6.0000 [IU] | Freq: Every day | SUBCUTANEOUS | Status: DC
  Administered 2020-11-03: 3 [IU] via SUBCUTANEOUS
  Administered 2020-11-04 – 2020-11-05 (×2): 6 [IU] via SUBCUTANEOUS
  Filled 2020-11-03 (×4): qty 0.06

## 2020-11-03 MED ORDER — LACTATED RINGERS IV BOLUS (SEPSIS)
1000.0000 mL | Freq: Once | INTRAVENOUS | Status: AC
  Administered 2020-11-03: 1000 mL via INTRAVENOUS

## 2020-11-03 MED ORDER — ACETAMINOPHEN 650 MG RE SUPP: 650.0000 mg | Freq: Four times a day (QID) | RECTAL | Status: DC | PRN

## 2020-11-03 MED ORDER — FUROSEMIDE 40 MG PO TABS
40.0000 mg | ORAL_TABLET | Freq: Every day | ORAL | Status: DC
  Administered 2020-11-04 – 2020-11-06 (×3): 40 mg via ORAL
  Filled 2020-11-03: qty 2
  Filled 2020-11-03 (×2): qty 1

## 2020-11-03 NOTE — ED Notes (Signed)
IV Team in with patient

## 2020-11-03 NOTE — ED Notes (Signed)
Pt is a difficult stick 2nd lab tech attempt now for blood.

## 2020-11-03 NOTE — ED Notes (Addendum)
Pt consumed 236 ml of apple juice.

## 2020-11-03 NOTE — H&P (Addendum)
History and Physical    [name] MCN:[id number] DOB: 12-02-1990 DOA: 11/03/2020  PCP: [name], MD Consultants:  GI: Dr. Ardis Hughs nephrology: Dr. Neta Ehlers endocrinologist: Dr. [name]  Patient coming from:  Home - lives with husband and daughter   Chief Complaint: unresponsive/hypoglycemia   HPI: [name] is a 30 y.o. female with medical history significant of HTN, type 1 DM, depression, cyclic vomiting syndrome, GERD, chronic anemia, anxiety, diabetic gastroparesis, CKD stage 3, Right BKA in 05/2020 who presented to ED with hypoglycemia and AMS.    History obtained from  her husband  and patient. She is awake and alert and feeling better.  He was getting ready for work and was telling her goodbye and she was unresponsive. She was snoring and breathing, but not answering him. He tried to arouse her,  he got her up in her wheelchair and called EMS.  They got there and checked her blood sugar and it was 30. She ate last night, but threw up a little bit. As far as he knows, she took her insulin and ate a bedtime snack. She also states she took the correct amount of insulin. Denies any fever, chills, coughing, shortness of breath, chest pain, abdominal pain, N/V/D. Has cyclic vomiting and gastroparesis, so she does have episodes of emesis at times.    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 ID consulted. GBS and staph aureus. Was on vancomycin and zosyn for 2 days, cefepime x 1 day and then rocephin 2g and daptomycin. PICC line placed with plans for daptomycin and rocephin x 4 weeks. Has f/u appointment with [facility] ID, Dr Vanessa , in 2 weeks. Also received blood transfusion on 11/01/2020.   ED Course: vitals: hypothermic to 92.8. afebrile, bp: 171/114, HR: 84, RR: 18, oxygen: 99% room air. Pertinent labs: glucose: 47.  NO elevated WBC, normal lactic acid, hgb 8.4 ( was 8.1 on discharge yesterday), creatinine: 1.38  (1.22-2.6) BUN: 28 (24-42). CXR with right multifocal Pna. Sepsis protocol initiated, not bolused as lactic acid was normal. IVF, vanc and cefepime given. 1 ampule of dextrose given and started on D10% infusion. Asked to admit for hypoglycemia and pneumonia.    Review of Systems: As per HPI; otherwise review of systems reviewed and negative.   Ambulatory Status:  wheelchair    Past Medical History:  Diagnosis Date   Allergy    Anemia    Anxiety    Blood transfusion without reported diagnosis    Dec 2018   Cataract    right eye   COVID-19 03/2020   Depression    Diabetes type 1, uncontrolled ([place]) 11/14/2011   Since age 6   Fibromyalgia    Gastroparesis    GERD (gastroesophageal reflux disease)    Hypertension    Infection    UTI April 2016    Past Surgical History:  Procedure Laterality Date   AMPUTATION Right 04/13/2020   Procedure: 1ST RAY AMPUTATION RIGHT FOOT;  Surgeon: Newt Minion, MD;  Location: [place];  Service: Orthopedics;  Laterality: Right;   AMPUTATION Right 06/06/2020   Procedure: RIGHT BELOW KNEE AMPUTATION;  Surgeon: Newt Minion, MD;  Location: [place];  Service: Orthopedics;  Laterality: Right;   ANKLE SURGERY     APPLICATION OF WOUND VAC Right 06/06/2020   Procedure: APPLICATION OF WOUND VAC;  Surgeon: Newt Minion, MD;  Location: [place];  Service: Orthopedics;  Laterality: Right;   CHOLECYSTECTOMY  11/15/2011   Procedure: LAPAROSCOPIC CHOLECYSTECTOMY WITH INTRAOPERATIVE CHOLANGIOGRAM;  Surgeon: [name], MD;  Location: [facility];  Service: General;  Laterality: N/A;   COLONOSCOPY     COLONOSCOPY WITH PROPOFOL N/A 06/27/2017   Procedure: COLONOSCOPY WITH PROPOFOL;  Surgeon: Milus Banister, MD;  Location: [facility];  Service: Endoscopy;  Laterality: N/A;   ESOPHAGOGASTRODUODENOSCOPY  12/03/2011   Procedure: ESOPHAGOGASTRODUODENOSCOPY (EGD);  Surgeon: Beryle Beams, MD;  Location: [facility];  Service: Endoscopy;  Laterality: N/A;   FLEXIBLE  SIGMOIDOSCOPY N/A 03/10/2017   Procedure: FLEXIBLE SIGMOIDOSCOPY;  Surgeon: Carol Ada, MD;  Location: [facility];  Service: Endoscopy;  Laterality: N/A;   INCISION AND DRAINAGE PERIRECTAL ABSCESS N/A 03/01/2017   Procedure: IRRIGATION AND DEBRIDEMENT PERIRECTAL ABSCESS;  Surgeon: Alphonsa Overall, MD;  Location: [facility];  Service: General;  Laterality: N/A;   IRRIGATION AND DEBRIDEMENT BUTTOCKS N/A 03/23/2017   Procedure: IRRIGATION AND DEBRIDEMENT BUTTOCKS, SETON PLACEMENT;  Surgeon: [name], MD;  Location: [facility];  Service: General;  Laterality: N/A;   LAPAROSCOPY  11/23/2011   Procedure: LAPAROSCOPY DIAGNOSTIC;  Surgeon: Edward Jolly, MD;  Location: [facility];  Service: General;  Laterality: N/A;   SIGMOIDOSCOPY     UPPER GASTROINTESTINAL ENDOSCOPY     WISDOM TOOTH EXTRACTION      Social History   Socioeconomic History   Marital status: Married    Spouse name: sergio   Number of children: 0   Years of education: college   Highest education level: Not on file  Occupational History   Occupation: Publishing rights manager call center    Employer: [employer]  Tobacco Use   Smoking status: Never   Smokeless tobacco: Never  Vaping Use   Vaping Use: Never used  Substance and Sexual Activity   Alcohol use: No    Alcohol/week: 0.0 standard drinks   Drug use: Yes    Types: Marijuana   Sexual activity: Not Currently    Partners: Male    Birth control/protection: None  Other Topics Concern   Not on file  Social History Narrative   Not on file   Social Determinants of Health   Financial Resource Strain: Not on file  Food Insecurity: Food Insecurity Present   Worried About [place] in the Last Year: Sometimes true   Ran Out of Food in the Last Year: Sometimes true  Transportation Needs: No Transportation Needs   Lack of Transportation (Medical): No   Lack of Transportation (Non-Medical): No  Physical Activity: Not on file  Stress: Not on file  Social  Connections: Not on file  Intimate Partner Violence: Not on file    Allergies  Allergen Reactions   Other Anaphylaxis    Reaction to [place] nuts    Lactose Intolerance (Gi) Diarrhea    Family History  Problem Relation Age of Onset   Diabetes Mother    Hypertension Father    Colon cancer Paternal Grandmother        pt thinks PGM was dx in her 8's   Diabetes Paternal Grandmother    Diabetes Maternal Grandmother    Diabetes Maternal Grandfather    Diabetes Paternal Grandfather    Diabetes Other    Breast cancer Paternal Aunt    Esophageal cancer Neg Hx    Liver cancer Neg Hx    Pancreatic cancer Neg Hx    Stomach cancer Neg Hx    Rectal cancer Neg Hx     Prior to Admission medications   Medication  Sig Start Date End Date Taking? Authorizing Provider  amLODipine (NORVASC) 10 MG tablet Take 1 tablet (10 mg total) by mouth daily. 06/15/20  Yes Thurnell Lose, MD  carvedilol (COREG) 12.5 MG tablet Take 1 tablet (12.5 mg total) by mouth 2 (two) times daily with a meal. 04/16/20 11/03/20 Yes Dahal, Marlowe Aschoff, MD  cyclobenzaprine (FLEXERIL) 10 MG tablet TAKE 1 TABLET(10 MG) BY MOUTH THREE TIMES DAILY AS NEEDED FOR MUSCLE SPASMS 10/16/20  Yes [name], MD  dicyclomine (BENTYL) 20 MG tablet Take 1 tablet (20 mg total) by mouth every 8 (eight) hours as needed for spasms. 11/25/19  Yes Petrucelli, Samantha R, PA-C  furosemide (LASIX) 40 MG tablet Take 1 tablet (40 mg total) by mouth daily. 05/24/20 11/20/20 Yes Mercy Riding, MD  insulin glargine (LANTUS) 100 UNIT/ML injection Inject 10 Units into the skin 2 (two) times daily. Morning & bedtime   Yes [provider]  insulin lispro (HUMALOG) 100 UNIT/ML injection Inject 0-5 Units into the skin 3 (three) times daily before meals. Sliding scale   Yes [provider]  loperamide (IMODIUM) 2 MG capsule Take 6 mg by mouth daily as needed for diarrhea or loose stools.    Yes [provider]  busPIRone (BUSPAR) 5 MG  tablet Take 1 tablet (5 mg total) by mouth 3 (three) times daily. 02/03/20   [name], MD  Continuous Blood Gluc Sensor (DEXCOM G6 SENSOR) MISC USE AS DIRECTED EVERY 10 DAYS 07/28/20   Renato Shin, MD  Continuous Blood Gluc Transmit (DEXCOM G6 TRANSMITTER) MISC USE AS DIRECTED TO  MONITOR  GLUCOSE  LEVELS 05/10/20   Renato Shin, MD  EPINEPHrine 0.3 mg/0.3 mL IJ SOAJ injection Inject 0.3 mg into the muscle once as needed (anaphylaxis/allergic reaction). 07/16/20   [name], MD  furosemide (LASIX) 40 MG tablet TAKE 1 TABLET (40 MG TOTAL) BY MOUTH DAILY. 05/24/20 05/24/21  Mercy Riding, MD  Insulin NPH, Human,, Isophane, (HUMULIN N) 100 UNIT/ML Kiwkpen INJECT 10 UNITS INTO THE SKIN TWO TIMES DAILY AT 8 IN THE MORNING AND 10 PM 05/24/20 05/24/21  Mercy Riding, MD  Insulin Pen Needle 32G X 4 MM MISC USE AS DIRECTED WITH INSULIN PENS 05/24/20 05/24/21  Mercy Riding, MD  LORazepam (ATIVAN) 0.5 MG tablet TAKE 1 TABLET(0.5 MG) BY MOUTH TWICE DAILY AS NEEDED FOR ANXIETY 06/18/20   [name], MD  metoCLOPramide (REGLAN) 5 MG tablet Take 1 tablet (5 mg total) by mouth in the morning and at bedtime. Please make and keep an appointment for further refills. 07/20/20   Armbruster, Carlota Raspberry, MD  mirtazapine (REMERON) 30 MG tablet TAKE 1 TABLET BY MOUTH AT  BEDTIME Patient taking differently: Take 30 mg by mouth at bedtime. 01/17/20   [name], MD  ondansetron (ZOFRAN ODT) 4 MG disintegrating tablet Take 1 tablet (4 mg total) by mouth every 8 (eight) hours as needed for nausea or vomiting. 06/14/20   Thurnell Lose, MD  oxyCODONE (ROXICODONE) 5 MG immediate release tablet Take 1 tablet (5 mg total) by mouth 3 (three) times daily as needed for severe pain. 10/16/20   Suzan Slick, NP  pantoprazole (PROTONIX) 40 MG tablet Take 1 tablet (40 mg total) by mouth 2 (two) times daily. 12/06/19   Armbruster, Carlota Raspberry, MD  silver sulfADIAZINE (SILVADENE) 1 % cream Apply 1 application topically daily. Apply  small amount to foot wound daily cover with dry dressing 05/24/20   Persons,  Bevely Palmer, PA  potassium chloride SA (KLOR-CON) 20 MEQ tablet Take 1 tablet (20 mEq total) by mouth daily for 5 doses. 07/04/19 07/30/19  Janeece Fitting, PA-C    Physical Exam: Vitals:   11/03/20 1500 11/03/20 1515 11/03/20 1538 11/03/20 1540  BP: (!) 155/95 (!) 152/107  (!) 168/103  Pulse: 87 93  97  Resp: 11 (!) 21  (!) 22  Temp:      TempSrc:      SpO2: 92% 98%  99%  Weight:   68 kg   Height:   5' 7"  (1.702 m)      General:  Appears calm and comfortable and is in NAD Eyes:  PERRL, EOMI, normal lids, iris ENT:  grossly normal hearing, lips & tongue, mmm; appropriate dentition Neck:  no LAD, masses or thyromegaly; no carotid bruits Cardiovascular:  RRR, no m/r/g. No LE edema.  Respiratory:   CTA bilaterally with no wheezes/rales/rhonchi.  Normal respiratory effort. Limited exam as she is lying down.  Abdomen:  soft, NT, ND, NABS Back:   normal alignment Skin:  no rash or induration seen on limited exam. Left toe amputation clean with no drainage.  Musculoskeletal:  right BKA. Left: good tone/ good ROM, no bony abnormality. Left great toe amputation.  Lower extremity:  No LE edema.  Limited foot exam with no ulcerations.   Psychiatric:  grossly normal mood and affect, speech fluent and appropriate, Aox3. Tearful at times. She is scared and cold, but alert and oriented and back to baseline.  Neurologic:  CN 2-12 grossly intact, moves all extremities in coordinated fashion, sensation intact    Radiological Exams on Admission: Independently reviewed - see discussion in A/P where applicable  DG Chest Port 1 View  Result Date: 11/03/2020 CLINICAL DATA:  30 year old female with sepsis. Found unresponsive by husband. EXAM: PORTABLE CHEST 1 VIEW COMPARISON:  Chest x-ray 09/25/2020. FINDINGS: There is a right upper extremity PICC with tip terminating in the right atrium. Patchy multifocal airspace consolidation  throughout the right lung, most confluent in the right upper lobe. Left lung appears clear. No pleural effusions. No pneumothorax. No evidence of pulmonary edema. Heart size is normal. Upper mediastinal contours are within normal limits. IMPRESSION: 1. Multilobar pneumonia in the right lung. 2. Tip of right upper extremity PICC is in the right atrium. Electronically Signed   By: Vinnie Langton M.D.   On: 11/03/2020 11:21   DG Foot Complete Left  Result Date: 11/03/2020 CLINICAL DATA:  Recent amputation, concern for osteomyelitis. The patient is hypoglycemic. EXAM: LEFT FOOT - COMPLETE 3+ VIEW COMPARISON:  Left foot radiograph dated 10/31/2020. FINDINGS: The patient is status post a first ray resection at the metatarsal phalangeal joint. There is no evidence of acute fracture or dislocation. No focal osseous demineralization to suggest osteomyelitis. Degenerative changes involving the navicular bone are redemonstrated. Soft tissues are unremarkable. IMPRESSION: No evidence of osteomyelitis. Electronically Signed   By: Zerita Boers M.D.   On: 11/03/2020 11:22    EKG: Independently reviewed.  NSR with rate 78; nonspecific ST changes with no evidence of acute ischemia   Labs on Admission: I have personally reviewed the available labs and imaging studies at the time of the admission.  Pertinent labs:  glucose: 47.-->69-->64-->71 NO elevated WBC normal lactic acid hgb 8.4 ( was 8.1 on discharge yesterday) creatinine: 1.38 (1.22-2.6) BUN: 28 (24-42) Multilobar pneumonia in right lung     Assessment/Plan Principal Problem:   Multifocal pneumonia CXR with findings of multilobar  pneumonia, but clinically she has no SOB, cough, fever and no white count or lactic acid. She was hypothermic. Sepsis criteria not met.  She was just discharged from hospital, so covering for HCAP with cefepime. Also received one dose of vanc in ER and MRSA screen pending  -urinary antigens pending  Procalcitonin  pending No anti tussives ordered since not symptomatic Continue to follow clinically   Active Problems: Hypothermia -required bear hugger initially -? If due to decreased heat production from hypoglycemia -checking TSH -treating infection -resolved and was able to come off bear hugger    Type 1 diabetes mellitus with hypoglycemia without coma (HCC) Hypoglycemic and placed on D10 infusion. She is eating and drinking well No other signs of sepsis met as she had no tachycardia, WBC, RR or hypotension, but does have findings on CXR.  -unsure why drop in sugar as she took normal amount of insulin and ate dinner with snack. Only had mild emesis.   Frequent accuchecks q 1 hour until stable. Has been trending upward.  BS to 157 in evening. Will change her over to D51/2 NS and give her 1/2 of her lantus at 6 units as I do not want to send her into DKA. Accuchecks q2 hour and made floor doc covering aware.  Will continue sliding scale insulin for meals, on very sensitive scale.  Diabetes is poorly controlled. A1c of 9.0 that was drawn 7 days ago.  Followed outpatient by dr. [name]  Recent left toe amputation secondary to osteomyelitis -discharged with PICC line yesterday from [facility] on daptomycin and rocephin x 4 weeks -she is on cefepime here, received one dose of vanc in ER, but discontinued as her foot shows no signs of infection.  -ID consulted and will see tomorrow.  -overall incision looks good today with no signs of infection.  have consulted wound care to continue to dress.     Hypertension associated with diabetes ([place]) Has been normo to hypertensive  Will continue her home coreg tonight.  Resume norvasc and hydralazine tomorrow    Diabetic gastroparesis associated with type 1 diabetes mellitus ([place]) Will continue her home medication of bentyl, reglan.     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 ([place]) At her baseline, renal dose medication/vanc per pharamcy Avoid nephrotoxic  agents Continue to monitor   Anemia Received unit of blood on 8/4 and hgb on discharge was 8.1 Stable at 8.4 today. Continue to monitor.     GERD (gastroesophageal reflux disease) Continue her PPI     Anxiety Continue her anti anxiety medication of buspar and ativan     Cannabinoid hyperemesis syndrome  Check UDS   Holding her cyclobenazaprine and gabapentin to make sure didn't contribute to her confusion    Body mass index is 23.49 kg/m.   Level of care: Progressive DVT prophylaxis:  lovenox  Code Status:  Full - confirmed with patient Family Communication: husband, sergio, present at bedside.  Disposition Plan:  The patient is from: home  Anticipated d/c is to: home   Requires inpatient hospitalization and is at significant risk of neurological worsening, medically decompensating and requires constant monitoring, IV therapy and assessment as well as MDM with specialists.  Patient is currently: acutely ill Consults called: ID  Admission status:  observation    Orma Flaming MD [facility]   How to contact the [facility] Attending or Consulting provider [place] or covering provider during after hours [place], for this patient?  Check the  care team in [facility] and look for a) attending/consulting [place] provider listed and b) the [facility] team listed Log into [email or web address] and use [place] universal password to access. If you do not have the password, please contact the hospital operator. Locate the [facility] provider you are looking for under [facility] and page to a number that you can be directly reached. If you still have difficulty reaching the provider, please page the [facility] (Director on Call) for the Hospitalists listed on amion for assistance.   11/03/2020, 3:49 PM

## 2020-11-03 NOTE — ED Notes (Signed)
IV consult order placed for assist with second PIV & orders labs.

## 2020-11-03 NOTE — ED Notes (Signed)
Pt started to desaturate into the 80's placed 3L nasal canual. O2 at 98%

## 2020-11-03 NOTE — ED Notes (Signed)
Turned bear hugger off. Notified provider on updated temp and EKG.

## 2020-11-03 NOTE — Sepsis Progress Note (Signed)
Secure chat w/ bedside nurse regarding LA and blood cultures. Blood cultures are in process and lactic acid was in process but has been canceled. Patient is a very difficult stick and I believe lab is going to re-draw LA.

## 2020-11-03 NOTE — ED Notes (Signed)
Called for a stat house tray from the kitchen

## 2020-11-03 NOTE — ED Notes (Signed)
[employer]

## 2020-11-03 NOTE — Sepsis Progress Note (Signed)
Sepsis protocol is being monitored by [employer]. 

## 2020-11-03 NOTE — ED Provider Notes (Signed)
[facility] EMERGENCY DEPARTMENT Provider Note   CSN: [id number] Arrival date & time: 11/03/20  0949     History Chief Complaint  Patient presents with   Hypoglycemia    [name] is a 30 y.o. female.  The history is provided by the patient, the EMS personnel and medical records. No language interpreter was used.  Hypoglycemia  30 year old female significant history of type 1 diabetes uncontrolled, anemia, gastroparesis, fibromyalgia, hypertension, recurrent urinary tract infection recent left great toe amputation due to foot infection brought here via EMS for altered mental status.  Per EMS note, patient, husband unresponsive this morning with an initial CBG of 30.  EMS contacted and patient was given 1 unit of glucagon as well as 25 g of D10W which patient became more responsive.  Patient reports she is having aches and pain throughout her entire body and feeling very cold.  She believes she may have passed out since this morning.  She is unable to tell me much but states that she have not been eating and drinking much due to not having much of an appetite.  She endorsed chills without fever no productive cough or shortness of breath denies nausea or vomiting diarrhea constipation or dysuria.  Patient recently has left great toe amputation on 7/29, has a PICC line for antibiotic.  She has had COVID infection in the past and states she has been vaccinated for COVID-19.  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[place])    Hypoxemic respiratory  failure, chronic (HCC)    Abnormal CT of the chest    GI bleeding 05/12/2020   Iron deficiency 04/11/2020   Osteomyelitis ([place]) 04/10/2020   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 ([place]) 02/03/2020   Cannabinoid hyperemesis syndrome 04/30/2019   Contraception management 65/53/7482   Cyclical vomiting syndrome 06/07/2018   Diabetic gastroparesis associated with type 1 diabetes mellitus ([place]) 06/04/2018   Anxiety 05/31/2018   Peripheral edema 05/31/2018   Chronic anemia 07/10/2017   GERD (gastroesophageal reflux disease) 06/23/2017   MDD (major depressive disorder), recurrent episode, mild ([place]) 02/28/2017   Diabetes mellitus type 1 ([place]) 04/28/2013   Severe protein-calorie malnutrition ([place]) 04/28/2013   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[place]) 11/14/2011   Hypertension associated with diabetes ([place]) 11/14/2011   Chronic abdominal pain 09/18/2011    Past Surgical History:  Procedure Laterality Date   AMPUTATION Right 04/13/2020   Procedure: 1ST RAY AMPUTATION RIGHT FOOT;  Surgeon: Newt Minion, MD;  Location: [place];  Service: Orthopedics;  Laterality: Right;   AMPUTATION Right 06/06/2020   Procedure: RIGHT BELOW KNEE AMPUTATION;  Surgeon: Newt Minion, MD;  Location: [place];  Service: Orthopedics;  Laterality: Right;   ANKLE SURGERY     APPLICATION OF WOUND VAC Right 06/06/2020   Procedure: APPLICATION OF WOUND VAC;  Surgeon: Newt Minion, MD;  Location: [place];  Service: Orthopedics;  Laterality: Right;   CHOLECYSTECTOMY  11/15/2011   Procedure: LAPAROSCOPIC CHOLECYSTECTOMY WITH INTRAOPERATIVE CHOLANGIOGRAM;  Surgeon: [name], MD;  Location: [facility];  Service: General;  Laterality: N/A;   COLONOSCOPY     COLONOSCOPY WITH PROPOFOL N/A 06/27/2017   Procedure: COLONOSCOPY WITH PROPOFOL;  Surgeon: Milus Banister, MD;  [facility];  Service: Endoscopy;  Laterality: N/A;   ESOPHAGOGASTRODUODENOSCOPY  12/03/2011   Procedure:  ESOPHAGOGASTRODUODENOSCOPY (EGD);  Surgeon: Beryle Beams, MD;  Location[facility];  Service: Endoscopy;  Laterality: N/A;   FLEXIBLE SIGMOIDOSCOPY N/A 03/10/2017   Procedure: FLEXIBLE SIGMOIDOSCOPY;  Surgeon: Carol Ada, MD;  [facility];  Service: Endoscopy;  Laterality: N/A;   INCISION AND DRAINAGE PERIRECTAL ABSCESS N/A 03/01/2017   Procedure: IRRIGATION AND DEBRIDEMENT PERIRECTAL ABSCESS;  Surgeon: Alphonsa Overall, MD;  [facility];  Service: General;  Laterality: N/A;   IRRIGATION AND DEBRIDEMENT BUTTOCKS N/A 03/23/2017   Procedure: IRRIGATION AND DEBRIDEMENT BUTTOCKS, SETON PLACEMENT;  Surgeon: [name], MD;  [facility];  Service: General;  Laterality: N/A;   LAPAROSCOPY  11/23/2011   Procedure: LAPAROSCOPY DIAGNOSTIC;  Surgeon: Edward Jolly, MD;  Location: [facility];  Service: General;  Laterality: N/A;   SIGMOIDOSCOPY     UPPER GASTROINTESTINAL ENDOSCOPY     WISDOM TOOTH EXTRACTION       OB History     Gravida  1   Para  1   Term      Preterm  1   AB      Living  1      SAB      IAB      Ectopic      Multiple  0   Live Births  1           Family History  Problem Relation Age of Onset   Diabetes Mother    Hypertension Father    Colon cancer Paternal Grandmother        pt thinks PGM was dx in her 70's   Diabetes Paternal Grandmother    Diabetes Maternal Grandmother    Diabetes Maternal Grandfather    Diabetes Paternal Grandfather    Diabetes Other    Breast cancer Paternal Aunt    Esophageal cancer Neg Hx    Liver cancer Neg Hx    Pancreatic cancer Neg Hx    Stomach cancer Neg Hx    Rectal cancer Neg Hx     Social History   Tobacco Use   Smoking status: Never   Smokeless tobacco: Never  Vaping Use   Vaping Use: Never used  Substance Use Topics   Alcohol use: No    Alcohol/week: 0.0 standard drinks   Drug use: Yes    Types: Marijuana    Home Medications Prior to Admission medications   Medication  Sig Start Date End Date Taking? Authorizing Provider  amLODipine (NORVASC) 10 MG tablet Take 1 tablet (10 mg total) by mouth daily. 06/15/20   Thurnell Lose, MD  busPIRone (BUSPAR) 5 MG tablet Take 1 tablet (5 mg total) by mouth 3 (three) times daily. 02/03/20   [name], MD  carvedilol (COREG) 12.5 MG tablet Take 1 tablet (12.5 mg total) by mouth 2 (two) times daily with a meal. 04/16/20 07/15/20  Dahal, Marlowe Aschoff, MD  Continuous Blood Gluc Sensor (DEXCOM G6 SENSOR) MISC USE AS DIRECTED EVERY 10 DAYS 07/28/20   Renato Shin, MD  Continuous Blood Gluc Transmit (DEXCOM G6 TRANSMITTER) MISC USE AS  DIRECTED TO  MONITOR  GLUCOSE  LEVELS 05/10/20   Renato Shin, MD  cyclobenzaprine (FLEXERIL) 10 MG tablet TAKE 1 TABLET(10 MG) BY MOUTH THREE TIMES DAILY AS NEEDED FOR MUSCLE SPASMS 10/16/20   [name], MD  dicyclomine (BENTYL) 20 MG tablet Take 1 tablet (20 mg total) by mouth every 8 (eight) hours as needed for spasms. 11/25/19   Petrucelli, Samantha R, PA-C  EPINEPHrine 0.3 mg/0.3 mL IJ SOAJ injection Inject 0.3 mg into the muscle once as needed (anaphylaxis/allergic reaction). 07/16/20   [name], MD  furosemide (LASIX) 40 MG tablet Take 1 tablet (40 mg total) by mouth daily. 05/24/20 11/20/20  Mercy Riding, MD  furosemide (LASIX) 40 MG tablet TAKE 1 TABLET (40 MG TOTAL) BY MOUTH DAILY. 05/24/20 05/24/21  Mercy Riding, MD  insulin glargine (LANTUS) 100 UNIT/ML injection Inject 10 Units into the skin 2 (two) times daily. Morning & bedtime    [provider]  insulin lispro (HUMALOG) 100 UNIT/ML injection Inject 0-5 Units into the skin 3 (three) times daily before meals. Sliding scale    [provider]  Insulin NPH, Human,, Isophane, (HUMULIN N) 100 UNIT/ML Kiwkpen INJECT 10 UNITS INTO THE SKIN TWO TIMES DAILY AT 8 IN THE MORNING AND 10 PM 05/24/20 05/24/21  Mercy Riding, MD  Insulin Pen Needle 32G X 4 MM MISC USE AS DIRECTED WITH INSULIN PENS 05/24/20 05/24/21  Mercy Riding, MD   loperamide (IMODIUM) 2 MG capsule Take 6 mg by mouth daily as needed for diarrhea or loose stools.     [provider]  LORazepam (ATIVAN) 0.5 MG tablet TAKE 1 TABLET(0.5 MG) BY MOUTH TWICE DAILY AS NEEDED FOR ANXIETY 06/18/20   [name], MD  metoCLOPramide (REGLAN) 5 MG tablet Take 1 tablet (5 mg total) by mouth in the morning and at bedtime. Please make and keep an appointment for further refills. 07/20/20   Armbruster, Carlota Raspberry, MD  mirtazapine (REMERON) 30 MG tablet TAKE 1 TABLET BY MOUTH AT  BEDTIME Patient taking differently: Take 30 mg by mouth at bedtime. 01/17/20   [name], MD  ondansetron (ZOFRAN ODT) 4 MG disintegrating tablet Take 1 tablet (4 mg total) by mouth every 8 (eight) hours as needed for nausea or vomiting. 06/14/20   Thurnell Lose, MD  oxyCODONE (ROXICODONE) 5 MG immediate release tablet Take 1 tablet (5 mg total) by mouth 3 (three) times daily as needed for severe pain. 10/16/20   Suzan Slick, NP  pantoprazole (PROTONIX) 40 MG tablet Take 1 tablet (40 mg total) by mouth 2 (two) times daily. 12/06/19   Armbruster, Carlota Raspberry, MD  silver sulfADIAZINE (SILVADENE) 1 % cream Apply 1 application topically daily. Apply small amount to foot wound daily cover with dry dressing 05/24/20   Persons, Bevely Palmer, PA  potassium chloride SA (KLOR-CON) 20 MEQ tablet Take 1 tablet (20 mEq total) by mouth daily for 5 doses. 07/04/19 07/30/19  Janeece Fitting, PA-C    Allergies    Other and Lactose intolerance (gi)  Review of Systems   Review of Systems  Unable to perform ROS: Mental status change   Physical Exam Updated Vital Signs BP 130/80   Pulse 79   Temp (!) 92.8 F (33.8 C) (Rectal)   Resp (!) 0   SpO2 91%   Physical Exam Vitals and nursing note reviewed.  Constitutional:      Appearance: She is well-developed.     Comments: Patient  laying in the lateral decubitus position, actively crying and appears uncomfortable.  HENT:     Head: Atraumatic.      Mouth/Throat:     Mouth: Mucous membranes are dry.  Eyes:     Conjunctiva/sclera: Conjunctivae normal.  Cardiovascular:     Rate and Rhythm: Normal rate and regular rhythm.     Pulses: Normal pulses.     Heart sounds: Normal heart sounds.  Pulmonary:     Effort: Pulmonary effort is normal.     Breath sounds: No wheezing, rhonchi or rales.  Abdominal:     Palpations: Abdomen is soft.     Tenderness: There is no abdominal tenderness.  Musculoskeletal:     Cervical back: Normal range of motion and neck supple. No tenderness.     Comments: Left foot: Ace wrap in place, well-appearing recent amputation of left great toe tender to palpation without significant erythema or warmth.  Sutures in place  Skin:    Findings: No rash.  Neurological:     Mental Status: She is alert. She is disoriented.  Psychiatric:        Mood and Affect: Mood normal.    ED Results / Procedures / Treatments   Labs (all labs ordered are listed, but only abnormal results are displayed) Labs Reviewed  COMPREHENSIVE METABOLIC PANEL - Abnormal; Notable for the following components:      Result Value   CO2 21 (*)    Glucose, Bld 47 (*)    BUN 28 (*)    Creatinine, Ser 1.38 (*)    Calcium 8.8 (*)    Albumin 1.7 (*)    GFR, Estimated 52 (*)    All other components within normal limits  URINALYSIS, ROUTINE W REFLEX MICROSCOPIC - Abnormal; Notable for the following components:   APPearance HAZY (*)    Glucose, UA 50 (*)    Hgb urine dipstick SMALL (*)    Protein, ur >=300 (*)    Bacteria, UA RARE (*)    Non Squamous Epithelial 0-5 (*)    All other components within normal limits  CBC WITH DIFFERENTIAL/PLATELET - Abnormal; Notable for the following components:   RBC 2.83 (*)    Hemoglobin 8.4 (*)    HCT 26.8 (*)    Neutro Abs 8.8 (*)    Abs Immature Granulocytes 0.09 (*)    All other components within normal limits  CBG MONITORING, ED - Abnormal; Notable for the following components:   Glucose-Capillary  42 (*)    All other components within normal limits  CBG MONITORING, ED - Abnormal; Notable for the following components:   Glucose-Capillary 69 (*)    All other components within normal limits  RESP PANEL BY RT-PCR (FLU A&B, COVID) ARPGX2  CULTURE, BLOOD (ROUTINE X 2)  CULTURE, BLOOD (ROUTINE X 2)  URINE CULTURE  LACTIC ACID, PLASMA  PROTIME-INR  APTT  CBC WITH DIFFERENTIAL/PLATELET  I-STAT BETA HCG BLOOD, ED ([facility], [facility], [facility] ONLY)    EKG EKG Interpretation  Date/Time:  Saturday November 03 2020 12:53:59 EDT Ventricular Rate:  78 PR Interval:  130 QRS Duration: 82 QT Interval:  381 QTC Calculation: 434 R Axis:   61 Text Interpretation: Sinus rhythm no change since earlier today Confirmed by Dorie Rank [phone]) on 11/03/2020 12:56:46 PM  Radiology DG Chest Port 1 View  Result Date: 11/03/2020 CLINICAL DATA:  30 year old female with sepsis. Found unresponsive by husband. EXAM: PORTABLE CHEST 1 VIEW COMPARISON:  Chest x-ray 09/25/2020. FINDINGS: There is a right  upper extremity PICC with tip terminating in the right atrium. Patchy multifocal airspace consolidation throughout the right lung, most confluent in the right upper lobe. Left lung appears clear. No pleural effusions. No pneumothorax. No evidence of pulmonary edema. Heart size is normal. Upper mediastinal contours are within normal limits. IMPRESSION: 1. Multilobar pneumonia in the right lung. 2. Tip of right upper extremity PICC is in the right atrium. Electronically Signed   By: Vinnie Langton M.D.   On: 11/03/2020 11:21   DG Foot Complete Left  Result Date: 11/03/2020 CLINICAL DATA:  Recent amputation, concern for osteomyelitis. The patient is hypoglycemic. EXAM: LEFT FOOT - COMPLETE 3+ VIEW COMPARISON:  Left foot radiograph dated 10/31/2020. FINDINGS: The patient is status post a first ray resection at the metatarsal phalangeal joint. There is no evidence of acute fracture or dislocation. No focal osseous demineralization to  suggest osteomyelitis. Degenerative changes involving the navicular bone are redemonstrated. Soft tissues are unremarkable. IMPRESSION: No evidence of osteomyelitis. Electronically Signed   By: Zerita Boers M.D.   On: 11/03/2020 11:22    Procedures .Critical Care  Date/Time: 11/03/2020 2:50 PM Performed by: Domenic Moras, PA-C Authorized by: Domenic Moras, PA-C   Critical care provider statement:    Critical care time (minutes):  45   Critical care was time spent personally by me on the following activities:  Discussions with consultants, evaluation of patient's response to treatment, examination of patient, ordering and performing treatments and interventions, ordering and review of laboratory studies, ordering and review of radiographic studies, pulse oximetry, re-evaluation of patient's condition, obtaining history from patient or surrogate and review of old charts   Medications Ordered in ED Medications  lactated ringers infusion ( Intravenous New Bag/Given 11/03/20 1343)  vancomycin (VANCOREADY) IVPB 1250 mg/250 mL (1,250 mg Intravenous New Bag/Given 11/03/20 1316)  dextrose 10 % infusion ( Intravenous New Bag/Given 11/03/20 1342)  sodium chloride flush (NS) 0.9 % injection 10-40 mL (10 mLs Intracatheter Given 11/03/20 1315)  sodium chloride flush (NS) 0.9 % injection 10-40 mL (has no administration in time range)  Chlorhexidine Gluconate Cloth 2 % PADS 6 each (0 each Topical Hold 11/03/20 1315)  vancomycin (VANCOREADY) IVPB 1250 mg/250 mL (has no administration in time range)  dextrose 50 % solution 50 mL (0 mLs Intravenous Hold 11/03/20 1019)  lactated ringers bolus 1,000 mL (0 mLs Intravenous Stopped 11/03/20 1443)  cefTRIAXone (ROCEPHIN) 2 g in sodium chloride 0.9 % 100 mL IVPB (0 g Intravenous Stopped 11/03/20 1328)  fentaNYL (SUBLIMAZE) injection 50 mcg (50 mcg Intravenous Given 11/03/20 1310)    ED Course  I have reviewed the triage vital signs and the nursing notes.  Pertinent labs & imaging  results that were available during my care of the patient were reviewed by me and considered in my medical decision making (see chart for details).  Clinical Course as of 11/03/20 1407  Sat Nov 03, 2020  1015 Notified that blood sugar iss 42 [JK]    Clinical Course User Index [JK] Dorie Rank, MD   MDM Rules/Calculators/A&P                           BP 130/84   Pulse 84   Temp (!) 93.3 F (34.1 C) (Rectal)   Resp 13   Wt 63.5 kg   SpO2 94%   BMI 21.93 kg/m   Final Clinical Impression(s) / ED Diagnoses Final diagnoses:  Sepsis, due to unspecified organism, unspecified whether  acute organ dysfunction present ([place])  Pneumonia of right lung due to infectious organism, unspecified part of lung  Hypoglycemia  Hypothermia, initial encounter    Rx / DC Orders ED Discharge Orders     None      10:47 AM Patient was unresponsive this morning, EMS obtain a CBG of 30 and patient did receive glucagon as well as D10 in which he became more responsive.  Patient was found to be hypothermic here with a rectal temp of 92.8, bear hugger was placed.  At this time she is complaining of body aches, and having chills.  She recently had a left great toe amputation due to diabetic foot ulcer causing infection.  She does have a PICC line on her right upper extremity.  Given her presentation, code sepsis initiated, will start antibiotic, and will admit for further care.  Care discussed with Dr. Tomi Bamberger  2:45 PM Chest x-ray obtained shows multilobar pneumonia.  UA without signs of urinary tract infection, normal WBC, hemoglobin is 8.4 which is similar to baseline.  Evidence of AKI with BUN 28, creatinine 1.38 she is currently receiving IV fluid.  She still is hyperglycemic therefore we will continue with D10 drip as well as oral agent.  X-ray of left foot without evidence of osteomyelitis.  Initially patient was discharged from [facility] yesterday and was discharged home with daptomycin and  Rocephin as treatment for her diabetic foot ulcer.  Our pharmacist did reach out and talk to representative from [facility] and now recommend treating with vancomycin and Rocephin.  Appreciate consultation from [facility] hospitalist, Dr. Eliberto Ivory, who agrees to see and will admit patient for further managements of her condition.  Sepsis reassessment done.   Domenic Moras, PA-C 11/03/20 1451    Dorie Rank, MD 11/03/20 [phone]

## 2020-11-03 NOTE — ED Notes (Signed)
Provided pt w/ bag lunch and saltine crackers.

## 2020-11-03 NOTE — ED Notes (Signed)
Provider stated to change pt fluids to D51/2 NS with q 2 accuchecks and give her 1/2 of her lantus. If she is eating and drinking well we can stop the LR fluids too.

## 2020-11-03 NOTE — Progress Notes (Signed)
For the night: Plan to switch patient to D5 and continue q2 H glucose checks for 6 hours. Will adjust frequency or checks, and fluids as needed based on upcoming CBGs. Will plan to give half of her normal lantus dose  = 6 units. Will continue to monitor.

## 2020-11-03 NOTE — ED Notes (Signed)
Provided pt w/ bag lunch and apple juice.

## 2020-11-03 NOTE — Progress Notes (Addendum)
Pharmacy Antibiotic Note  [name] is a 30 y.o. female admitted on 11/03/2020 with sepsis.  Pharmacy has been consulted for vancomycin dosing. Patient was supposed to start daptomycin 568m IV daily thru PICC, discharged yesterday from [facility], for recent DFI with L great toe amputation.  Plan: Vancomycin 12557mIV q24h (eAUC 525, Cr 1.3839mL) Cefepime 2g IV q12h --order vancomycin level as necessary --Monitor renal function, clinical status, and antibiotic plan  Weight: 63.5 kg (139 lb 15.9 oz)  Temp (24hrs), Avg:93.1 F (33.9 C), Min:92.8 F (33.8 C), Max:93.3 F (34.1 C)  Recent Labs  Lab 11/03/20 1214  WBC 10.4    CrCl cannot be calculated (Patient's most recent lab result is older than the maximum 21 days allowed.).    Allergies  Allergen Reactions   Other Anaphylaxis    Reaction to braBoliviats    Lactose Intolerance (Gi) Diarrhea    Antimicrobials this admission: Vanc 8/6 >>  Ceftriaxone x1 Cefepime 8/6 >>  Dose adjustments this admission: N/A  Microbiology results: 8/6 BCx:  8/6 UCx:  Thank you for allowing pharmacy to be a part of this patient's care.  Kathryn MannersharmD, BCCWeather[facility] Clinical Pharmacist ED RPh Phone: [phone]

## 2020-11-03 NOTE — ED Triage Notes (Signed)
Pt BIB EMS for hypoglycemia, Found by husband unresponsive with a CBG of 30. EMS gave 25g D10W and 1 unit of glucagon before she became responsive. PICC line was used to push D10, unable to eat or drink   CBG 99 130's/70's HR 70 99% RA

## 2020-11-04 DIAGNOSIS — N1832 Chronic kidney disease, stage 3b: Secondary | ICD-10-CM | POA: Diagnosis not present

## 2020-11-04 DIAGNOSIS — Z8249 Family history of ischemic heart disease and other diseases of the circulatory system: Secondary | ICD-10-CM | POA: Diagnosis not present

## 2020-11-04 DIAGNOSIS — Z833 Family history of diabetes mellitus: Secondary | ICD-10-CM | POA: Diagnosis not present

## 2020-11-04 DIAGNOSIS — E10649 Type 1 diabetes mellitus with hypoglycemia without coma: Secondary | ICD-10-CM | POA: Diagnosis not present

## 2020-11-04 DIAGNOSIS — M797 Fibromyalgia: Secondary | ICD-10-CM | POA: Diagnosis not present

## 2020-11-04 DIAGNOSIS — K219 Gastro-esophageal reflux disease without esophagitis: Secondary | ICD-10-CM | POA: Diagnosis not present

## 2020-11-04 DIAGNOSIS — E1065 Type 1 diabetes mellitus with hyperglycemia: Secondary | ICD-10-CM | POA: Diagnosis not present

## 2020-11-04 DIAGNOSIS — Z89422 Acquired absence of other left toe(s): Secondary | ICD-10-CM | POA: Diagnosis not present

## 2020-11-04 DIAGNOSIS — D649 Anemia, unspecified: Secondary | ICD-10-CM | POA: Diagnosis not present

## 2020-11-04 DIAGNOSIS — J189 Pneumonia, unspecified organism: Secondary | ICD-10-CM | POA: Diagnosis not present

## 2020-11-04 DIAGNOSIS — Z89511 Acquired absence of right leg below knee: Secondary | ICD-10-CM | POA: Diagnosis not present

## 2020-11-04 DIAGNOSIS — Y95 Nosocomial condition: Secondary | ICD-10-CM | POA: Diagnosis present

## 2020-11-04 DIAGNOSIS — A419 Sepsis, unspecified organism: Secondary | ICD-10-CM | POA: Diagnosis not present

## 2020-11-04 DIAGNOSIS — E11649 Type 2 diabetes mellitus with hypoglycemia without coma: Secondary | ICD-10-CM | POA: Diagnosis not present

## 2020-11-04 DIAGNOSIS — E162 Hypoglycemia, unspecified: Secondary | ICD-10-CM | POA: Diagnosis not present

## 2020-11-04 DIAGNOSIS — I152 Hypertension secondary to endocrine disorders: Secondary | ICD-10-CM | POA: Diagnosis not present

## 2020-11-04 DIAGNOSIS — K3184 Gastroparesis: Secondary | ICD-10-CM | POA: Diagnosis not present

## 2020-11-04 DIAGNOSIS — R680 Hypothermia, not associated with low environmental temperature: Secondary | ICD-10-CM | POA: Diagnosis not present

## 2020-11-04 DIAGNOSIS — F32A Depression, unspecified: Secondary | ICD-10-CM | POA: Diagnosis not present

## 2020-11-04 DIAGNOSIS — I129 Hypertensive chronic kidney disease with stage 1 through stage 4 chronic kidney disease, or unspecified chronic kidney disease: Secondary | ICD-10-CM | POA: Diagnosis not present

## 2020-11-04 DIAGNOSIS — G9341 Metabolic encephalopathy: Secondary | ICD-10-CM | POA: Diagnosis not present

## 2020-11-04 DIAGNOSIS — E1043 Type 1 diabetes mellitus with diabetic autonomic (poly)neuropathy: Secondary | ICD-10-CM | POA: Diagnosis not present

## 2020-11-04 DIAGNOSIS — Z9049 Acquired absence of other specified parts of digestive tract: Secondary | ICD-10-CM | POA: Diagnosis not present

## 2020-11-04 DIAGNOSIS — Z20822 Contact with and (suspected) exposure to covid-19: Secondary | ICD-10-CM | POA: Diagnosis not present

## 2020-11-04 DIAGNOSIS — Z803 Family history of malignant neoplasm of breast: Secondary | ICD-10-CM | POA: Diagnosis not present

## 2020-11-04 DIAGNOSIS — D631 Anemia in chronic kidney disease: Secondary | ICD-10-CM | POA: Diagnosis not present

## 2020-11-04 DIAGNOSIS — E1022 Type 1 diabetes mellitus with diabetic chronic kidney disease: Secondary | ICD-10-CM | POA: Diagnosis not present

## 2020-11-04 DIAGNOSIS — E1069 Type 1 diabetes mellitus with other specified complication: Secondary | ICD-10-CM | POA: Diagnosis not present

## 2020-11-04 DIAGNOSIS — T68XXXA Hypothermia, initial encounter: Secondary | ICD-10-CM | POA: Diagnosis not present

## 2020-11-04 DIAGNOSIS — M19072 Primary osteoarthritis, left ankle and foot: Secondary | ICD-10-CM | POA: Diagnosis not present

## 2020-11-04 DIAGNOSIS — M869 Osteomyelitis, unspecified: Secondary | ICD-10-CM | POA: Diagnosis not present

## 2020-11-04 LAB — GLUCOSE, CAPILLARY
Glucose-Capillary: 189 mg/dL — ABNORMAL HIGH (ref 70–99)
Glucose-Capillary: 174 mg/dL — ABNORMAL HIGH (ref 70–99)
Glucose-Capillary: 138 mg/dL — ABNORMAL HIGH (ref 70–99)

## 2020-11-04 LAB — CBC
HCT: 25.7 % — ABNORMAL LOW (ref 36.0–46.0)
Hemoglobin: 8.2 g/dL — ABNORMAL LOW (ref 12.0–15.0)
MCH: 29.9 pg (ref 26.0–34.0)
MCHC: 31.9 g/dL (ref 30.0–36.0)
MCV: 93.8 fL (ref 80.0–100.0)
Platelets: 282 10*3/uL (ref 150–400)
RBC: 2.74 MIL/uL — ABNORMAL LOW (ref 3.87–5.11)
RDW: 13.4 % (ref 11.5–15.5)
WBC: 13.2 10*3/uL — ABNORMAL HIGH (ref 4.0–10.5)
nRBC: 0 % (ref 0.0–0.2)

## 2020-11-04 LAB — BASIC METABOLIC PANEL
Anion gap: 7 (ref 5–15)
BUN: 24 mg/dL — ABNORMAL HIGH (ref 6–20)
CO2: 20 mmol/L — ABNORMAL LOW (ref 22–32)
Calcium: 8.3 mg/dL — ABNORMAL LOW (ref 8.9–10.3)
Chloride: 107 mmol/L (ref 98–111)
Creatinine, Ser: 1.36 mg/dL — ABNORMAL HIGH (ref 0.44–1.00)
GFR, Estimated: 53 mL/min — ABNORMAL LOW (ref >60)
Glucose, Bld: 189 mg/dL — ABNORMAL HIGH (ref 70–99)
Potassium: 4.1 mmol/L (ref 3.5–5.1)
Sodium: 134 mmol/L — ABNORMAL LOW (ref 135–145)

## 2020-11-04 LAB — PROCALCITONIN: Procalcitonin: 6.03 ng/mL

## 2020-11-04 LAB — URINE CULTURE: Culture: NO GROWTH

## 2020-11-04 LAB — RAPID URINE DRUG SCREEN, HOSP PERFORMED
Amphetamines: NOT DETECTED
Barbiturates: NOT DETECTED
Benzodiazepines: NOT DETECTED
Cocaine: NOT DETECTED
Opiates: POSITIVE — AB
Tetrahydrocannabinol: POSITIVE — AB

## 2020-11-04 LAB — CBG MONITORING, ED
Glucose-Capillary: 165 mg/dL — ABNORMAL HIGH (ref 70–99)
Glucose-Capillary: 174 mg/dL — ABNORMAL HIGH (ref 70–99)
Glucose-Capillary: 197 mg/dL — ABNORMAL HIGH (ref 70–99)
Glucose-Capillary: 145 mg/dL — ABNORMAL HIGH (ref 70–99)

## 2020-11-04 LAB — MRSA NEXT GEN BY PCR, NASAL: MRSA by PCR Next Gen: NOT DETECTED

## 2020-11-04 MED ORDER — VANCOMYCIN HCL 1250 MG/250ML IV SOLN
1250.0000 mg | INTRAVENOUS | Status: DC
  Filled 2020-11-04: qty 250

## 2020-11-04 MED ORDER — VANCOMYCIN HCL 1500 MG/300ML IV SOLN
1500.0000 mg | Freq: Once | INTRAVENOUS | Status: AC
  Administered 2020-11-04: 1500 mg via INTRAVENOUS
  Filled 2020-11-04: qty 300

## 2020-11-04 NOTE — Plan of Care (Signed)

## 2020-11-04 NOTE — Consult Note (Signed)
[facility]                                                                                        Patient Identification: Patient Name: [name] MRN: [id number] [place] Date: 11/03/2020  9:50 AM Today's Date: 11/04/2020 Reason for consult: Osteomyelitis and PNA Requesting provider: Eric [place]   Principal Problem:   Multifocal pneumonia Active Problems:   Hypertension associated with diabetes ([place])   GERD (gastroesophageal reflux disease)   Anxiety   Diabetic gastroparesis associated with type 1 diabetes mellitus ([place])   Cannabinoid hyperemesis syndrome   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 However, on 3L [place] currently  -Recent hospital admission at [facility] leading to concern for HAP -Patient was on daptomycin  and ceftriaxone before admission for  tx of left foot osteomyelitis. Of note, there was a concern for daptomycin associated pulmonary toxicity in the past. Possible dapto induced lung injury with re-exposure. Eosinophils count WNL.  -Procalcitonin 6.03  -Strep pneumo antigen negative, Legionella ag pending  -Influenza A and B SARS cov 2 negative  S/p Left hallux amputation /Osteomyelitis ( Staph aureus and group B strep) Patient on daptomycin and ceftriaxone for4 weeks per [facility] ID during recent hospitalisation   Rt BKA - stump healing well Type1 DM CKD  Recommendations  Would recommend to change Daptomycin to vancomycin , pharmacy to dose for pulmonary coverage and +/- dapto induced lung injury  Continue cefepime Follow up blood cultures  Respiratory cultures, MRSA PCR Fu urine legionella ag, does not  seems to be atypical pna Monitor CBC, BMP and Vancomcyin trough   Rest of the management as per the primary team. Please call with questions or concerns.  Thank you for the consult  Kathryn Oz, MD Infectious Disease Physician [facility] [address] [place], [place] [address] Phone: [phone]  Fax: [phone]  __________________________________________________________________________________________________________ HPI and Hospital Course: 30 year old female with PMH of hypertension, type I DM, depression, chronic anemia, anxiety, diabetic gastroparesis, cyclical vomiting syndrome, CKD, status post right BKA who presented to the ED on 8/6 with hypoglycemia and altered mental status.  Blood glucose on EMS arrival was 30.  History taken with the help of chart review and patient herself.  She tells me that she after her recent hospitalization at [facility] until getting admitted for low blood glucose at [facility].  Denies any prior fevers, chills, cough,, shortness of breath, chest pain.  Denies any nausea, vomiting, abdominal pain or diarrhea.   Of note she was recently admitted at [facility] 7/29-8/5 where she had left hallux amputation done secondary to osteomyelitis.  She was seen by ID and was planned to treat for osteomyelitis with daptomycin and ceftriaxone for 4 weeks.  PICC line was placed.  Culture grew staph aureus ( no sensi available) and group B  streptococcus.   She also has a follow-up with ID 8/17.   At ED she was afebrile Labs was remarkable for creatinine 1.38, lactic acid 0.6.  Procalcitonin 6.03.  WBC 10.4.  Chest 2 sets no growth in less than 24 hours Chest x-ray concerning for multifocal pneumonia in the right side  ROS: General- Denies fever, chills, loss of appetite and loss of weight HEENT - Denies headache, blurry vision, neck pain, sinus pain Chest - Denies any chest pain, SOB or cough CVS- Denies any   palpitations Abdomen- Denies any nausea, vomiting, abdominal pain, hematochezia and diarrhea Neuro - Denies any weakness, numbness, tingling sensation Psych - Denies any changes in mood irritability or depressive symptoms GU- Denies any burning, dysuria, hematuria or increased frequency of urination Skin - denies any rashes/lesions MSK - denies any joint pain/swelling or restricted ROM   Past Medical History:  Diagnosis Date   Allergy    Anemia    Anxiety    Blood transfusion without reported diagnosis    Dec 2018   Cataract    right eye   COVID-19 03/2020   Depression    Diabetes type 1, uncontrolled ([place]) 11/14/2011   Since age 36   Fibromyalgia    Gastroparesis    GERD (gastroesophageal reflux disease)    Hypertension    Infection    UTI April 2016   Past Surgical History:  Procedure Laterality Date   AMPUTATION Right 04/13/2020   Procedure: 1ST RAY AMPUTATION RIGHT FOOT;  Surgeon: Newt Minion, MD;  Location: [place];  Service: Orthopedics;  Laterality: Right;   AMPUTATION Right 06/06/2020   Procedure: RIGHT BELOW KNEE AMPUTATION;  Surgeon: Newt Minion, MD;  Location: [place];  Service: Orthopedics;  Laterality: Right;   ANKLE SURGERY     APPLICATION OF WOUND VAC Right 06/06/2020   Procedure: APPLICATION OF WOUND VAC;  Surgeon: Newt Minion, MD;  Location: [place];  Service: Orthopedics;  Laterality: Right;   CHOLECYSTECTOMY  11/15/2011   Procedure: LAPAROSCOPIC CHOLECYSTECTOMY WITH INTRAOPERATIVE CHOLANGIOGRAM;  Surgeon: [name], MD;  Location: [facility];  Service: General;  Laterality: N/A;   COLONOSCOPY     COLONOSCOPY WITH PROPOFOL N/A 06/27/2017   Procedure: COLONOSCOPY WITH PROPOFOL;  Surgeon: Milus Banister, MD;  Location: [facility];  Service: Endoscopy;  Laterality: N/A;   ESOPHAGOGASTRODUODENOSCOPY  12/03/2011   Procedure: ESOPHAGOGASTRODUODENOSCOPY (EGD);  Surgeon: Beryle Beams, MD;  Location: [facility];  Service: Endoscopy;  Laterality: N/A;   FLEXIBLE  SIGMOIDOSCOPY N/A 03/10/2017   Procedure: FLEXIBLE SIGMOIDOSCOPY;  Surgeon: Carol Ada, MD;  Location: [facility];  Service: Endoscopy;  Laterality: N/A;   INCISION AND DRAINAGE PERIRECTAL ABSCESS N/A 03/01/2017   Procedure: IRRIGATION AND DEBRIDEMENT PERIRECTAL ABSCESS;  Surgeon: Alphonsa Overall, MD;  Location: [facility];  Service: General;  Laterality: N/A;   IRRIGATION AND DEBRIDEMENT BUTTOCKS N/A 03/23/2017   Procedure: IRRIGATION AND DEBRIDEMENT BUTTOCKS, SETON PLACEMENT;  Surgeon: [name], MD;  Location: [facility];  Service: General;  Laterality: N/A;   LAPAROSCOPY  11/23/2011   Procedure: LAPAROSCOPY DIAGNOSTIC;  Surgeon: Edward Jolly, MD;  Location: [facility];  Service: General;  Laterality: N/A;   SIGMOIDOSCOPY     UPPER GASTROINTESTINAL ENDOSCOPY     WISDOM TOOTH EXTRACTION       Scheduled Meds:  amLODipine  10 mg Oral Daily   busPIRone  5 mg Oral TID   carvedilol  12.5 mg Oral BID WC   Chlorhexidine Gluconate Cloth  6  each Topical Daily   enoxaparin (LOVENOX) injection  40 mg Subcutaneous Q24H   furosemide  40 mg Oral Daily   hydrALAZINE  50 mg Oral BID   insulin aspart  0-6 Units Subcutaneous TID WC   insulin glargine-yfgn  6 Units Subcutaneous QHS   LORazepam  0.5 mg Oral BID   metoCLOPramide  5 mg Oral BID WC   mirtazapine  30 mg Oral QHS   pantoprazole  40 mg Oral BID   sodium chloride flush  10-40 mL Intracatheter Q12H   Continuous Infusions:  sodium chloride 75 mL/hr at 11/04/20 0131   ceFEPime (MAXIPIME) IV Stopped (11/04/20 0538)   PRN Meds:.acetaminophen **OR** acetaminophen, dicyclomine, HYDROmorphone (DILAUDID) injection, ondansetron **OR** ondansetron (ZOFRAN) IV, oxyCODONE, polyvinyl alcohol, sodium chloride flush  Allergies  Allergen Reactions   Other Anaphylaxis    Reaction to [place] nuts    Lactose Intolerance (Gi) Diarrhea   Social History   Socioeconomic History   Marital status: Married    Spouse name: sergio   Number of children: 0    Years of education: college   Highest education level: Not on file  Occupational History   Occupation: Publishing rights manager call center    Employer: [employer]  Tobacco Use   Smoking status: Never   Smokeless tobacco: Never  Vaping Use   Vaping Use: Never used  Substance and Sexual Activity   Alcohol use: No    Alcohol/week: 0.0 standard drinks   Drug use: Yes    Types: Marijuana   Sexual activity: Not Currently    Partners: Male    Birth control/protection: None  Other Topics Concern   Not on file  Social History Narrative   Not on file   Social Determinants of Health   Financial Resource Strain: Not on file  Food Insecurity: Food Insecurity Present   Worried About Estate manager/land agent of Food in the Last Year: Sometimes true   Ran Out of Food in the Last Year: Sometimes true  Transportation Needs: No Transportation Needs   Lack of Transportation (Medical): No   Lack of Transportation (Non-Medical): No  Physical Activity: Not on file  Stress: Not on file  Social Connections: Not on file  Intimate Partner Violence: Not on file    Vitals BP 138/90 (BP Location: Left Arm)   Pulse 96   Temp 98.1 F (36.7 C) (Rectal)   Resp 16   Ht 5' 7"  (1.702 m)   Wt 68 kg   SpO2 100%   BMI 23.49 kg/m    Physical Exam Constitutional: Sitting up in the bed.  Appears comfortable    Comments:   Cardiovascular:     Rate and Rhythm: Normal rate and regular rhythm.     Heart sounds:  Pulmonary:     Effort: Bilateral equal air entry, Rales at the right lung    Comments: On 2 L nasal cannula  Abdominal:     Palpations: Abdomen is soft.     Tenderness: Nontender and nondistended  Musculoskeletal:        General: No swelling or tenderness.  Right BKA site healed well, left great toe amputated site healing well       Skin:    Comments: No lesions or rashes  Neurological:     General: No focal deficit present.   Psychiatric:        Mood and Affect: Mood  normal.  Pertinent Microbiology Results for orders placed or performed during the hospital encounter of 11/03/20  Resp  Panel by RT-PCR (Flu A&B, Covid) Nasopharyngeal Swab     Status: None   Collection Time: 11/03/20 10:41 AM   Specimen: Nasopharyngeal Swab; Nasopharyngeal(NP) swabs in vial transport medium  Result Value Ref Range Status   SARS Coronavirus 2 by RT PCR NEGATIVE NEGATIVE Final    Comment: (NOTE) SARS-CoV-2 target nucleic acids are NOT DETECTED.  The SARS-CoV-2 RNA is generally detectable in upper respiratory specimens during the acute phase of infection. The lowest concentration of SARS-CoV-2 viral copies this assay can detect is 138 copies/mL. A negative result does not preclude SARS-Cov-2 infection and should not be used as the sole basis for treatment or other patient management decisions. A negative result may occur with  improper specimen collection/handling, submission of specimen other than nasopharyngeal swab, presence of viral mutation(s) within the areas targeted by this assay, and inadequate number of viral copies(<138 copies/mL). A negative result must be combined with clinical observations, patient history, and epidemiological information. The expected result is Negative.  Fact Sheet for Patients:  EntrepreneurPulse.com.au  Fact Sheet for Healthcare Providers:  IncredibleEmployment.be  This test is no t yet approved or cleared by the [place] FDA and  has been authorized for detection and/or diagnosis of SARS-CoV-2 by FDA under an Emergency Use Authorization (EUA). This EUA will remain  in effect (meaning this test can be used) for the duration of the COVID-19 declaration under Section 564(b)(1) of the Act, 21 U.S.C.section 360bbb-3(b)(1), unless the authorization is terminated  or revoked sooner.       Influenza A by PCR NEGATIVE NEGATIVE Final   Influenza B by PCR NEGATIVE NEGATIVE Final    Comment:  (NOTE) The Xpert Xpress SARS-CoV-2/FLU/RSV plus assay is intended as an aid in the diagnosis of influenza from Nasopharyngeal swab specimens and should not be used as a sole basis for treatment. Nasal washings and aspirates are unacceptable for Xpert Xpress SARS-CoV-2/FLU/RSV testing.  Fact Sheet for Patients: EntrepreneurPulse.com.au  Fact Sheet for Healthcare Providers: IncredibleEmployment.be  This test is not yet approved or cleared by the [place] FDA and has been authorized for detection and/or diagnosis of SARS-CoV-2 by FDA under an Emergency Use Authorization (EUA). This EUA will remain in effect (meaning this test can be used) for the duration of the COVID-19 declaration under Section 564(b)(1) of the Act, 21 U.S.C. section 360bbb-3(b)(1), unless the authorization is terminated or revoked.  Performed at [facility], [place] [address] [place], [place] [address]   Blood Culture (routine x 2)     Status: None (Preliminary result)   Collection Time: 11/03/20  1:24 PM   Specimen: BLOOD  Result Value Ref Range Status   Specimen Description BLOOD LEFT ANTECUBITAL  Final   Special Requests   Final    BOTTLES DRAWN AEROBIC AND ANAEROBIC Blood Culture adequate volume   Culture   Final    NO GROWTH < 24 HOURS Performed at [facility], [place] [address] [place], [place] [address]    Report Status PENDING  Incomplete     Pertinent Lab seen by me: CBC Latest Ref Rng & Units 11/04/2020 11/03/2020 09/25/2020  WBC 4.0 - 10.5 K/uL 13.2(H) 10.4 6.2  Hemoglobin 12.0 - 15.0 g/dL 8.2(L) 8.4(L) 11.3(L)  Hematocrit 36.0 - 46.0 % 25.7(L) 26.8(L) 32.9(L)  Platelets 150 - 400 K/uL 282 270 340   CMP Latest Ref Rng & Units 11/04/2020 11/03/2020 09/25/2020  Glucose 70 - 99 mg/dL 189(H) 47(L) 46(L)  BUN 6 - 20 mg/dL 24(H) 28(H)  24(H)  Creatinine 0.44 - 1.00 mg/dL 1.36(H) 1.38(H) 1.22(H)  Sodium 135 - 145 mmol/L 134(L) 137 137  Potassium 3.5 - 5.1  mmol/L 4.1 4.2 2.8(L)  Chloride 98 - 111 mmol/L 107 110 100  CO2 22 - 32 mmol/L 20(L) 21(L) 29  Calcium 8.9 - 10.3 mg/dL 8.3(L) 8.8(L) 8.5(L)  Total Protein 6.5 - 8.1 g/dL - 6.5 6.8  Total Bilirubin 0.3 - 1.2 mg/dL - 0.5 0.4  Alkaline Phos 38 - 126 U/L - 76 93  AST 15 - 41 U/L - 31 24  ALT 0 - 44 U/L - 26 21    Pertinent Imagings/Other Imagings Plain films and CT images have been personally visualized and interpreted; radiology reports have been reviewed. Decision making incorporated into the Impression / Recommendations.  Left foot xray 11/03/20 FINDINGS: The patient is status post a first ray resection at the metatarsal phalangeal joint. There is no evidence of acute fracture or dislocation. No focal osseous demineralization to suggest osteomyelitis. Degenerative changes involving the navicular bone are redemonstrated. Soft tissues are unremarkable.   IMPRESSION: No evidence of osteomyelitis.  Chest Xray 11/03/20 FINDINGS: There is a right upper extremity PICC with tip terminating in the right atrium. Patchy multifocal airspace consolidation throughout the right lung, most confluent in the right upper lobe. Left lung appears clear. No pleural effusions. No pneumothorax. No evidence of pulmonary edema. Heart size is normal. Upper mediastinal contours are within normal limits.   IMPRESSION: 1. Multilobar pneumonia in the right lung. 2. Tip of right upper extremity PICC is in the right atrium.  I spent more than 70  minutes for this patient encounter including review of prior medical records/discussing diagnostics and treatment plan with the patient/family/coordinate care with primary/other specialits with greater than 50% of time in face to face encounter.   Electronically signed by:   Kathryn Oz, MD Infectious Disease Physician [facility] for Infectious Disease Pager: [phone]

## 2020-11-04 NOTE — ED Notes (Signed)
Pt crying upset not sure why  She has partially eaten food at her bedside  Pain med given

## 2020-11-04 NOTE — ED Notes (Signed)
The pts picc line irrigated and dressing changedd by the iv team nurse  All lines clear now

## 2020-11-04 NOTE — Progress Notes (Addendum)
Pharmacy Antibiotic Note  [name] is a 30 y.o. female admitted on 11/03/2020 osteomyelitis and pneumonia. Pharmacy has been consulted for vancomycin dosing. Patient was supposed to start daptomycin 529m IV daily thru PICC, discharged 8/5 from [facility], for recent DFI with L great toe amputation.   Goal AUC: 400-550  Plan: Vancomycin IV 15057mx 1  Vancomycin 125089mV q24h (eAUC 483, Cr 1.11m20m) Continue Cefepime 2g IV q12h --order vancomycin level as necessary --Monitor renal function, clinical status, and antibiotic plan  Height: 5' 7"  (170.2 cm) Weight: 68 kg (150 lb) IBW/kg (Calculated) : 61.6  Temp (24hrs), Avg:98.2 F (36.8 C), Min:98.1 F (36.7 C), Max:98.2 F (36.8 C)  Recent Labs  Lab 11/03/20 1214 11/03/20 1241 11/04/20 0452  WBC 10.4  --  13.2*  CREATININE  --  1.38* 1.36*  LATICACIDVEN  --  0.6  --      Estimated Creatinine Clearance: 58.3 mL/min (A) (by C-G formula based on SCr of 1.36 mg/dL (H)).    Allergies  Allergen Reactions   Other Anaphylaxis    Reaction to brazBolivias    Lactose Intolerance (Gi) Diarrhea    Antimicrobials this admission: Vanc 8/6 x 1  Vanc 8/7 Ceftriaxone x1 Cefepime 8/6 >>  Dose adjustments this admission: N/A  Microbiology results: 8/6 BCx: NGTD 8/6 UCx: NGTD  Thank you for allowing pharmacy to participate in this patient's care.  ShelLevonne SpillerarmD PGY1 Acute Care Resident  11/04/2020,2:44 PM

## 2020-11-04 NOTE — ED Notes (Signed)
Pt requesting meds for pain back and foot pain.

## 2020-11-04 NOTE — ED Notes (Signed)
Attempted report 

## 2020-11-04 NOTE — Progress Notes (Signed)
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  As husband was getting ready for work, he noticed that patient was unresponsive.  On EMS arrival, blood sugar was noted to be 30.  Normal appetite with meal overnight and patient apparently took the correct amount of her normal insulin regimen.  Recent hospitalization at [facility] from  10/26/2020-11/02/2020 for left hallux amputation secondary to osteomyelitis. ID consulted. GBS and staph aureus. Was on vancomycin and zosyn for 2 days, cefepime x 1 day and then rocephin 2g and daptomycin. PICC line placed with plans for daptomycin and rocephin x 4 weeks. Has f/u appointment with [facility] ID, Dr [name], in 2 weeks. Also received blood transfusion on 11/01/2020.   In the ED, hypothermic to 92.8, bp: 171/114, HR: 84, RR: 18, oxygen: 99% room air. Pertinent labs: glucose: 47.  WBC wnL, normal lactic acid, hgb 8.4 ( was 8.1 on discharge yesterday), creatinine: 1.38 (1.22-2.6) BUN: 28 (24-42). CXR with right multifocal Pna. Sepsis protocol initiated, not bolused as lactic acid was normal. IVF, vanc and cefepime given. 1 ampule of dextrose given and started on D10% infusion. Asked to admit for hypoglycemia and pneumonia.   Assessment & Plan:   Principal Problem:   Multifocal pneumonia Active Problems:   Hypertension associated with diabetes ([place])   GERD (gastroesophageal reflux disease)   Anxiety   Diabetic gastroparesis associated with type 1 diabetes mellitus (HCC)   Cannabinoid  hyperemesis syndrome   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 ([name])   Type 1 diabetes mellitus with hypoglycemia without coma ([name])   Acute metabolic encephalopathy: Resolved Patient unarousable at home per husband, was found to have a glucose of 30.  Suspect etiology multifactorial with hypoglycemic event at home as well as multifocal pneumonia findings on chest x-ray.  Patient was given IV dextrose and initially started on a D10 infusion.  Also started on empiric antibiotics for pneumonia.  Appears her mental status is now back to her normal baseline. --Continue treatment as below  Hypothermia Temperature 92.8 F on admission, likely secondary to significant hypoglycemia.  TSH within normal limits.  Patient initially required [facility] which has now been discontinued.  Temperature 98.1 F this morning; hypothermia now resolved. --Continue to monitor temperature per unit routine  Hypoglycemia: resolved Hx type 1 diabetes mellitus Patient was noted to have glucose 30 at home via EMS.  Received IV dextrose.  Initially required D10 infusion which has now been discontinued.  Patient reports normal eating habits and utilizing her baseline insulin regimen of Lantus 12 units daily with sliding scale insulin.  No significant gastroparesis or vomiting reported.  Unclear etiology.  Hemoglobin A1c 9.0, poorly controlled.  Follows with endocrinology outpatient, Dr. [name]. --Continue Semglee at reduced dose, 6 units [place] daily --SSI for further coverage --CBG before every meal/at bedtime  Multilobar pneumonia concerning for HCAP given recent hospitalization Patient with hypothermia on admission, but suspect that is related to her hypoglycemia.  Otherwise patient with out leukocytosis.  Lactic acid 0.6.  Chest x-ray with multilobar pneumonia and noted  right lung.  Strep pneumo urinary antigen negative.  Also concern of possible lung injury with daptomycin use. --ID following, appreciate  assistance --Daptomycin changed to vancomycin, pharmacy consulted for dosing/monitoring --Continue cefepime 2 g IV every 12 hours  Osteomyelitis left toe s/p amputation Recently hospitalized at [facility] from 10/26/2020-11/02/2020 for left hallux amputation.  On outpatient daptomycin and ceftriaxone x4 weeks.  Has follow-up with infectious disease scheduled with Dr. Vanessa [place] in 2 weeks.  PICC line already in place. --ID following --On vancomycin/cefepime as above --xycodone 5 mg q4h prn moderate pain --dilaudid 0.87m IV q3h prn severe breakthrough pain  Essential hypertension --Amlodipine 10 mg p.o. daily --Carvedilol 12.5 mg p.o. twice daily --Hydralazine 50 mg p.o. twice daily --Furosemide 40 mg p.o. daily  Anxiety/depression --BuSpar 5 mg p.o. 3 times daily --Lorazepam 0.5 mg p.o. twice daily  GERD: Protonix 40 mg p.o. twice daily  Diabetic gastroparesis Cyclical vomiting syndrome --Reglan 5 mg p.o. twice daily with meals --Bentyl 20 mg every 8 hours as needed spasms --Zofran as needed  CKD stage IIIb Baseline creatinine 1.22-2.6.  Creatinine admission 1.38, at baseline. --Avoid nephrotoxins, renally dose all medications --Repeat BMP in the a.m.   DVT prophylaxis: enoxaparin (LOVENOX) injection 40 mg Start: 11/03/20 1630   Code Status: Full Code Family Communication: No family present at bedside this morning  Disposition Plan:  Level of care: Progressive Status is: Observation  The patient remains OBS appropriate and will d/c before 2 midnights.  Dispo: The patient is from: Home              Anticipated d/c is to: Home              Patient currently is not medically stable to d/c.   Difficult to place patient No        Consultants:  Infectious disease  Procedures:  None  Antimicrobials:  Vancomycin 8/7>> Cefepime 8/6>>   Subjective: Patient seen examined at bedside, resting comfortably.  Continues in ED holding area.  Complaining of some pain to  her left foot surgical site.  States is due for her pain medication.  Patient unclear etiology to her hypoglycemic event yesterday.  States no changes in dietary habits no significant nausea/emesis and adherence to her normal insulin regimen.  No other questions or concerns at this time.  Currently denies headache, no visual changes, no fever/chills/night sweats, no current nausea/vomiting/diarrhea, no chest pain, no palpitations, no shortness of breath, no abdominal pain, no weakness, no fatigue, no paresthesias.  No acute events overnight per nurse staff.  Objective: Vitals:   11/04/20 0743 11/04/20 0900 11/04/20 1000 11/04/20 1112  BP: (!) 158/92 138/90 (!) 142/85 (!) 159/96  Pulse: (!) 103 96 96 93  Resp: 19 16 14 18   Temp:    98.2 F (36.8 C)  TempSrc:    Oral  SpO2: 100% 100% 100% 100%  Weight:      Height:        Intake/Output Summary (Last 24 hours) at 11/04/2020 1147 Last data filed at 11/04/2020 1126 Gross per 24 hour  Intake 450.4 ml  Output 400 ml  Net 50.4 ml   Filed Weights   11/03/20 1030 11/03/20 1538  Weight: 63.5 kg 68 kg    Examination:  General exam: Appears calm and comfortable, appears older than stated age Respiratory system: Clear to auscultation. Respiratory effort normal.  On room air Cardiovascular system: S1 & S2 heard, RRR. No JVD, murmurs, rubs, gallops or clicks. No pedal edema. Gastrointestinal  system: Abdomen is nondistended, soft and nontender. No organomegaly or masses felt. Normal bowel sounds heard. Central nervous system: Alert and oriented. No focal neurological deficits. Extremities: Right BKA noted, noted left great toe amputation, no lower extremity edema, moves all extremities independently Skin: No rashes, lesions or ulcers Psychiatry: Judgement and insight appear poor.  Mood & affect appropriate.     Data Reviewed: I have personally reviewed following labs and imaging studies  CBC: Recent Labs  Lab 11/03/20 1214 11/04/20 0452   WBC 10.4 13.2*  NEUTROABS 8.8*  --   HGB 8.4* 8.2*  HCT 26.8* 25.7*  MCV 94.7 93.8  PLT 270 195   Basic Metabolic Panel: Recent Labs  Lab 11/03/20 1241 11/04/20 0452  NA 137 134*  K 4.2 4.1  CL 110 107  CO2 21* 20*  GLUCOSE 47* 189*  BUN 28* 24*  CREATININE 1.38* 1.36*  CALCIUM 8.8* 8.3*   GFR: Estimated Creatinine Clearance: 58.3 mL/min (A) (by C-G formula based on SCr of 1.36 mg/dL (H)). Liver Function Tests: Recent Labs  Lab 11/03/20 1241  AST 31  ALT 26  ALKPHOS 76  BILITOT 0.5  PROT 6.5  ALBUMIN 1.7*   No results for input(s): LIPASE, AMYLASE in the last 168 hours. No results for input(s): AMMONIA in the last 168 hours. Coagulation Profile: Recent Labs  Lab 11/03/20 1241  INR 1.0   Cardiac Enzymes: No results for input(s): CKTOTAL, CKMB, CKMBINDEX, TROPONINI in the last 168 hours. BNP (last 3 results) No results for input(s): PROBNP in the last 8760 hours. HbA1C: No results for input(s): HGBA1C in the last 72 hours. CBG: Recent Labs  Lab 11/03/20 2233 11/04/20 0210 11/04/20 0443 11/04/20 0729 11/04/20 0945  GLUCAP 190* 165* 174* 197* 145*   Lipid Profile: No results for input(s): CHOL, HDL, LDLCALC, TRIG, CHOLHDL, LDLDIRECT in the last 72 hours. Thyroid Function Tests: Recent Labs    11/03/20 1241  TSH 1.472   Anemia Panel: No results for input(s): VITAMINB12, FOLATE, FERRITIN, TIBC, IRON, RETICCTPCT in the last 72 hours. Sepsis Labs: Recent Labs  Lab 11/03/20 1241 11/04/20 0452  PROCALCITON  --  6.03  LATICACIDVEN 0.6  --     Recent Results (from the past 240 hour(s))  Resp Panel by RT-PCR (Flu A&B, Covid) Nasopharyngeal Swab     Status: None   Collection Time: 11/03/20 10:41 AM   Specimen: Nasopharyngeal Swab; Nasopharyngeal(NP) swabs in vial transport medium  Result Value Ref Range Status   SARS Coronavirus 2 by RT PCR NEGATIVE NEGATIVE Final    Comment: (NOTE) SARS-CoV-2 target nucleic acids are NOT DETECTED.  The  SARS-CoV-2 RNA is generally detectable in upper respiratory specimens during the acute phase of infection. The lowest concentration of SARS-CoV-2 viral copies this assay can detect is 138 copies/mL. A negative result does not preclude SARS-Cov-2 infection and should not be used as the sole basis for treatment or other patient management decisions. A negative result may occur with  improper specimen collection/handling, submission of specimen other than nasopharyngeal swab, presence of viral mutation(s) within the areas targeted by this assay, and inadequate number of viral copies(<138 copies/mL). A negative result must be combined with clinical observations, patient history, and epidemiological information. The expected result is Negative.  Fact Sheet for Patients:  EntrepreneurPulse.com.au  Fact Sheet for Healthcare Providers:  IncredibleEmployment.be  This test is no t yet approved or cleared by the [place] FDA and  has been authorized for detection and/or diagnosis of SARS-CoV-2 by FDA  under an Emergency Use Authorization (EUA). This EUA will remain  in effect (meaning this test can be used) for the duration of the COVID-19 declaration under Section 564(b)(1) of the Act, 21 U.S.C.section 360bbb-3(b)(1), unless the authorization is terminated  or revoked sooner.       Influenza A by PCR NEGATIVE NEGATIVE Final   Influenza B by PCR NEGATIVE NEGATIVE Final    Comment: (NOTE) The Xpert Xpress SARS-CoV-2/FLU/RSV plus assay is intended as an aid in the diagnosis of influenza from Nasopharyngeal swab specimens and should not be used as a sole basis for treatment. Nasal washings and aspirates are unacceptable for Xpert Xpress SARS-CoV-2/FLU/RSV testing.  Fact Sheet for Patients: EntrepreneurPulse.com.au  Fact Sheet for Healthcare Providers: IncredibleEmployment.be  This test is not yet approved or  cleared by the [place] FDA and has been authorized for detection and/or diagnosis of SARS-CoV-2 by FDA under an Emergency Use Authorization (EUA). This EUA will remain in effect (meaning this test can be used) for the duration of the COVID-19 declaration under Section 564(b)(1) of the Act, 21 U.S.C. section 360bbb-3(b)(1), unless the authorization is terminated or revoked.  Performed at [facility], [place] [address] [place], [place] [address]   Blood Culture (routine x 2)     Status: None (Preliminary result)   Collection Time: 11/03/20  1:24 PM   Specimen: BLOOD  Result Value Ref Range Status   Specimen Description BLOOD LEFT ANTECUBITAL  Final   Special Requests   Final    BOTTLES DRAWN AEROBIC AND ANAEROBIC Blood Culture adequate volume   Culture   Final    NO GROWTH < 24 HOURS Performed at [facility], [place] [address], [place], [place] [address]    Report Status PENDING  Incomplete         Radiology Studies: DG Chest Port 1 View  Result Date: 11/03/2020 CLINICAL DATA:  30 year old female with sepsis. Found unresponsive by husband. EXAM: PORTABLE CHEST 1 VIEW COMPARISON:  Chest x-ray 09/25/2020. FINDINGS: There is a right upper extremity PICC with tip terminating in the right atrium. Patchy multifocal airspace consolidation throughout the right lung, most confluent in the right upper lobe. Left lung appears clear. No pleural effusions. No pneumothorax. No evidence of pulmonary edema. Heart size is normal. Upper mediastinal contours are within normal limits. IMPRESSION: 1. Multilobar pneumonia in the right lung. 2. Tip of right upper extremity PICC is in the right atrium. Electronically Signed   By: Vinnie Langton M.D.   On: 11/03/2020 11:21   DG Foot Complete Left  Result Date: 11/03/2020 CLINICAL DATA:  Recent amputation, concern for osteomyelitis. The patient is hypoglycemic. EXAM: LEFT FOOT - COMPLETE 3+ VIEW COMPARISON:  Left foot radiograph dated  10/31/2020. FINDINGS: The patient is status post a first ray resection at the metatarsal phalangeal joint. There is no evidence of acute fracture or dislocation. No focal osseous demineralization to suggest osteomyelitis. Degenerative changes involving the navicular bone are redemonstrated. Soft tissues are unremarkable. IMPRESSION: No evidence of osteomyelitis. Electronically Signed   By: Zerita Boers M.D.   On: 11/03/2020 11:22        Scheduled Meds:  amLODipine  10 mg Oral Daily   busPIRone  5 mg Oral TID   carvedilol  12.5 mg Oral BID WC   Chlorhexidine Gluconate Cloth  6 each Topical Daily   enoxaparin (LOVENOX) injection  40 mg Subcutaneous Q24H   furosemide  40 mg Oral Daily   hydrALAZINE  50 mg  Oral BID   insulin aspart  0-6 Units Subcutaneous TID WC   insulin glargine-yfgn  6 Units Subcutaneous QHS   LORazepam  0.5 mg Oral BID   metoCLOPramide  5 mg Oral BID WC   mirtazapine  30 mg Oral QHS   pantoprazole  40 mg Oral BID   sodium chloride flush  10-40 mL Intracatheter Q12H   Continuous Infusions:  sodium chloride 75 mL/hr at 11/04/20 0131   ceFEPime (MAXIPIME) IV Stopped (11/04/20 0538)     LOS: 0 days    Time spent: 41 minutes spent on chart review, discussion with nursing staff, consultants, updating family and interview/physical exam; more than 50% of that time was spent in counseling and/or coordination of care.    Hunner Garcon J [place] DO [facility] Available via Epic secure chat 7am-7pm After these hours, please refer to coverage provider listed on [email or web address] 11/04/2020, 11:47 AM

## 2020-11-04 NOTE — ED Notes (Signed)
Pt laying at the bedside watching tv.

## 2020-11-05 LAB — CBC
HCT: 23.7 % — ABNORMAL LOW (ref 36.0–46.0)
Hemoglobin: 7.5 g/dL — ABNORMAL LOW (ref 12.0–15.0)
MCH: 29.8 pg (ref 26.0–34.0)
MCHC: 31.6 g/dL (ref 30.0–36.0)
MCV: 94 fL (ref 80.0–100.0)
Platelets: 248 10*3/uL (ref 150–400)
RBC: 2.52 MIL/uL — ABNORMAL LOW (ref 3.87–5.11)
RDW: 13.4 % (ref 11.5–15.5)
WBC: 11.3 10*3/uL — ABNORMAL HIGH (ref 4.0–10.5)
nRBC: 0 % (ref 0.0–0.2)

## 2020-11-05 LAB — BASIC METABOLIC PANEL
Anion gap: 5 (ref 5–15)
BUN: 21 mg/dL — ABNORMAL HIGH (ref 6–20)
CO2: 20 mmol/L — ABNORMAL LOW (ref 22–32)
Calcium: 7.9 mg/dL — ABNORMAL LOW (ref 8.9–10.3)
Chloride: 110 mmol/L (ref 98–111)
Creatinine, Ser: 1.13 mg/dL — ABNORMAL HIGH (ref 0.44–1.00)
GFR, Estimated: >60 mL/min (ref >60)
Glucose, Bld: 164 mg/dL — ABNORMAL HIGH (ref 70–99)
Potassium: 4.2 mmol/L (ref 3.5–5.1)
Sodium: 135 mmol/L (ref 135–145)

## 2020-11-05 LAB — GLUCOSE, CAPILLARY
Glucose-Capillary: 167 mg/dL — ABNORMAL HIGH (ref 70–99)
Glucose-Capillary: 112 mg/dL — ABNORMAL HIGH (ref 70–99)
Glucose-Capillary: 172 mg/dL — ABNORMAL HIGH (ref 70–99)
Glucose-Capillary: 183 mg/dL — ABNORMAL HIGH (ref 70–99)

## 2020-11-05 LAB — PROCALCITONIN: Procalcitonin: 5.56 ng/mL

## 2020-11-05 MED ORDER — CEFEPIME IV (FOR PTA / DISCHARGE USE ONLY): 2.0000 g | Freq: Three times a day (TID) | INTRAVENOUS | 0 refills | Status: DC

## 2020-11-05 MED ORDER — INSULIN GLARGINE 100 UNIT/ML ~~LOC~~ SOLN: 8.0000 [IU] | Freq: Every day | SUBCUTANEOUS | 11 refills | Status: DC

## 2020-11-05 MED ORDER — HYDRALAZINE HCL 20 MG/ML IJ SOLN
10.0000 mg | Freq: Once | INTRAMUSCULAR | Status: AC
  Administered 2020-11-05: 10 mg via INTRAVENOUS
  Filled 2020-11-05: qty 1

## 2020-11-05 MED ORDER — CEFAZOLIN IV (FOR PTA / DISCHARGE USE ONLY): 2.0000 g | Freq: Three times a day (TID) | INTRAVENOUS | 0 refills | Status: AC

## 2020-11-05 MED ORDER — CEFEPIME IV (FOR PTA / DISCHARGE USE ONLY): 2.0000 g | Freq: Two times a day (BID) | INTRAVENOUS | 0 refills | Status: AC

## 2020-11-05 MED ORDER — ALTEPLASE 2 MG IJ SOLR
2.0000 mg | Freq: Once | INTRAMUSCULAR | Status: AC
Start: 2020-11-05 — End: 2020-11-05
  Administered 2020-11-05: 2 mg
  Filled 2020-11-05: qty 2

## 2020-11-05 NOTE — Plan of Care (Signed)

## 2020-11-05 NOTE — Consult Note (Signed)
WOC Nurse Consult Note: Patient receiving care in [facility] 2W20 Recent left hallux amputation at [facility]  Reason for Consult: Left great hallux amputation site needs dressing changed.  Wound type: Surgical  Pressure Injury POA: NA Wound bed: Purple with sutures in place. Drainage (amount, consistency, odor)  Periwound: intact Dressing procedure/placement/frequency: Remove dressing. Clean the surgical area with NS, pat dry. Apply a small piece of Xeroform gauze over the surgical site and secure with Kerlix. Change daily.   Monitor the wound area(s) for worsening of condition such as: Signs/symptoms of infection, increase in size, development of or worsening of odor, development of pain, or increased pain at the affected locations.   Notify the medical team if any of these develop.  Thank you for the consult. [place] nurse will not follow at this time.   Please re-consult the [place] team if needed.  Cathlean Marseilles Tamala Julian, MSN, RN, [place], [place], [facility] Wound Treatment Associate Pager [phone]

## 2020-11-05 NOTE — Progress Notes (Signed)
  ID Brief Note    Wound culture ID  2+ Streptococci, beta hemolytic group B Abnormal    Wound culture ID  2+ Staphylococcus aureus Abnormal    Resulting Agency [facility]   Susceptibility  Organism Antibiotic Method Susceptibility  Streptococci, beta hemolytic group B Organism ID MICRO MIC SUSCEPTIBILITY   Comment: Identification testing performed by Bruker matrix-assisted laser desorption/ionization time of flight(MALDI-TOF)mass spectrometry.  Staphylococcus aureus Organism ID MICRO MIC SUSCEPTIBILITY   Staphylococcus aureus Cefazolin MICRO MIC SUSCEPTIBILITY <=4: Susceptible  Staphylococcus aureus Clindamycin MICRO MIC SUSCEPTIBILITY >4: Resistant  Staphylococcus aureus Erythromycin MICRO MIC SUSCEPTIBILITY <=0.25: Susceptible  Staphylococcus aureus Gentamicin MICRO MIC SUSCEPTIBILITY <=1: Susceptible  Staphylococcus aureus Oxacillin MICRO MIC SUSCEPTIBILITY 0.5: Susceptible  Staphylococcus aureus Penicillin G MICRO MIC SUSCEPTIBILITY >8: Resistant  Staphylococcus aureus Tetracycline MICRO MIC SUSCEPTIBILITY <=2: Susceptible  Staphylococcus aureus Trimethoprim/Sulfamethoxazole MICRO MIC SUSCEPTIBILITY <=0.5/9.5: Susceptible  Staphylococcus aureus Vancomycin MICRO MIC SUSCEPTIBILITY 1: Susceptible    11/04/20 MRSA is negative  Continue cefepime DC Vancomycin as MRSA PCR is negative and wound cx from left foot is MSSA Sputum cx collection and urine legionella ag pending  [name], MD Infectious Disease Physician [facility] [address] [place], [place] [address] Phone: [phone]  Fax: [phone]'

## 2020-11-05 NOTE — Progress Notes (Addendum)
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  She is off of nasal cannula and eager to go home   Assessment Right-sided multifocal pneumonia/possible HAP ?  Daptomycin induced acute lung injury -     ED presentation was primarily secondary to altered mental status in the setting of hypoglycemia.  She did not have any respiratory symptoms except required oxygen briefly and back to room air now.  I doubt she will be able to give sputum samples.  2. Status post left hallux amputation/osteomyelitis(MSSA group B strep)  3.  Right BKA 4.  Type I DM 5.  CKD  Recommendations Continue cefepime for total 7 days after which can convert to cefazolin.  Cefazolin will cover both MSSA and group B strep isolated from her left foot cultures Patient already has an appointment with her ID provider at [facility] on 8/17 for follow-up of her osteomyelitis.  Planned for 4-week course by her ID provider Monitor CBC and BMP weekly Will follow blood cultures peripherally to make sure no growth, otherwise I will sign off for now.   Plan  discussed with patient/ID pharmacy and Primary  Rest of the management as per the primary team. Thank you for the consult. Please page with pertinent questions or concerns.  ______________________________________________________________________ Subjective patient seen and examined at the bedside she is sitting up in the bed.  She is eager to go home.  She is off of the nasal cannula.  Denies any fevers,, chills and sweats.  Denies any chest pain, shortness of breath and cough.  Denies any nausea, vomiting and diarrhea.   Vitals BP 137/79 (BP Location: Left Arm)   Pulse 95   Temp 97.7 F (36.5 C) (Oral)   Resp 18   Ht 5' 7"  (1.702 m)   Wt 68 kg   SpO2 99%   BMI 23.49 kg/m      Physical Exam Constitutional: Sitting up in bed, appears comfortable    Comments:   Cardiovascular:     Rate and Rhythm: Normal rate and regular rhythm.     Heart sounds:   Pulmonary:     Effort: Pulmonary effort is normal.     Comments:   Abdominal:     Palpations: Abdomen is soft.     Tenderness: Nontender and nondistended  Musculoskeletal:        General: Right BKA, left hallux amputation site is healing well  Skin:    Comments: No lesions or rashes  Neurological:     General: No focal deficit present.   Psychiatric:  Mood and Affect: Mood normal.   Pertinentlabs  CBC Latest Ref Rng & Units 11/05/2020 11/04/2020 11/03/2020  WBC 4.0 - 10.5 K/uL 11.3(H) 13.2(H) 10.4  Hemoglobin 12.0 - 15.0 g/dL 7.5(L) 8.2(L) 8.4(L)  Hematocrit 36.0 - 46.0 % 23.7(L) 25.7(L) 26.8(L)  Platelets 150 - 400 K/uL 248 282 270   CMP Latest Ref Rng & Units 11/05/2020 11/04/2020 11/03/2020  Glucose 70 - 99 mg/dL 164(H) 189(H) 47(L)  BUN 6 - 20 mg/dL 21(H) 24(H) 28(H)  Creatinine 0.44 - 1.00 mg/dL 1.13(H) 1.36(H) 1.38(H)  Sodium 135 - 145 mmol/L 135 134(L) 137  Potassium 3.5 - 5.1 mmol/L 4.2 4.1 4.2  Chloride 98 - 111 mmol/L 110 107 110  CO2 22 - 32 mmol/L 20(L) 20(L) 21(L)  Calcium 8.9 - 10.3 mg/dL 7.9(L) 8.3(L)  8.8(L)  Total Protein 6.5 - 8.1 g/dL - - 6.5  Total Bilirubin 0.3 - 1.2 mg/dL - - 0.5  Alkaline Phos 38 - 126 U/L - - 76  AST 15 - 41 U/L - - 31  ALT 0 - 44 U/L - - 26    Pertinent Microbiology  Results for orders placed or performed during the hospital encounter of 11/03/20  Resp Panel by RT-PCR (Flu A&B, Covid) Nasopharyngeal Swab     Status: None   Collection Time: 11/03/20 10:41 AM   Specimen: Nasopharyngeal Swab; Nasopharyngeal(NP) swabs in vial transport medium  Result Value Ref Range Status   SARS Coronavirus 2 by RT PCR NEGATIVE NEGATIVE Final    Comment: (NOTE) SARS-CoV-2 target nucleic acids are NOT DETECTED.  The SARS-CoV-2 RNA is generally detectable in upper respiratory specimens during the acute phase of infection. The lowest concentration of SARS-CoV-2 viral copies this assay can detect is 138 copies/mL. A negative result does not preclude SARS-Cov-2 infection and should not be used as the sole basis for treatment or other patient management decisions. A negative result may occur with  improper specimen collection/handling, submission of specimen other than nasopharyngeal swab, presence of viral mutation(s) within the areas targeted by this assay, and inadequate number of viral copies(<138 copies/mL). A negative result must be combined with clinical observations, patient history, and epidemiological information. The expected result is Negative.  Fact Sheet for Patients:  EntrepreneurPulse.com.au  Fact Sheet for Healthcare Providers:  IncredibleEmployment.be  This test is no t yet approved or cleared by the [place] FDA and  has been authorized for detection and/or diagnosis of SARS-CoV-2 by FDA under an Emergency Use Authorization (EUA). This EUA will remain  in effect (meaning this test can be used) for the duration of the COVID-19 declaration under Section 564(b)(1) of the Act, 21 U.S.C.section 360bbb-3(b)(1), unless  the authorization is terminated  or revoked sooner.       Influenza A by PCR NEGATIVE NEGATIVE Final   Influenza B by PCR NEGATIVE NEGATIVE Final    Comment: (NOTE) The Xpert Xpress SARS-CoV-2/FLU/RSV plus assay is intended as an aid in the diagnosis of influenza from Nasopharyngeal swab specimens and should not be used as a sole basis for treatment. Nasal washings and aspirates are unacceptable for Xpert Xpress SARS-CoV-2/FLU/RSV testing.  Fact Sheet for Patients: EntrepreneurPulse.com.au  Fact Sheet for Healthcare Providers: IncredibleEmployment.be  This test is not yet approved or cleared by the [place] FDA and has been authorized for detection and/or diagnosis of SARS-CoV-2 by FDA under an Emergency Use Authorization (EUA). This EUA will remain in effect (meaning this test can be used) for the duration of the COVID-19 declaration under Section 564(b)(1)  of the Act, 21 U.S.C. section 360bbb-3(b)(1), unless the authorization is terminated or revoked.  Performed at [facility], [place] [address], [place], [place] [address]   Urine Culture     Status: None   Collection Time: 11/03/20 10:41 AM   Specimen: In/Out Cath Urine  Result Value Ref Range Status   Specimen Description IN/OUT CATH URINE  Final   Special Requests NONE  Final   Culture   Final    NO GROWTH Performed at [facility], [place] [address], [place], [place] [address]    Report Status 11/04/2020 FINAL  Final  Blood Culture (routine x 2)     Status: None (Preliminary result)   Collection Time: 11/03/20  1:24 PM   Specimen: BLOOD  Result Value Ref Range Status   Specimen Description BLOOD LEFT ANTECUBITAL  Final   Special Requests   Final    BOTTLES DRAWN AEROBIC AND ANAEROBIC Blood Culture adequate volume   Culture   Final    NO GROWTH < 24 HOURS Performed at [facility], [place] [address] [place], [place] [address]    Report Status PENDING   Incomplete  MRSA Next Gen by PCR, Nasal     Status: None   Collection Time: 11/04/20 11:32 AM   Specimen: Nasal Mucosa; Nasal Swab  Result Value Ref Range Status   MRSA by PCR Next Gen NOT DETECTED NOT DETECTED Final    Comment: (NOTE) The GeneXpert MRSA Assay (FDA approved for NASAL specimens only), is one component of a comprehensive MRSA colonization surveillance program. It is not intended to diagnose MRSA infection nor to guide or monitor treatment for MRSA infections. Test performance is not FDA approved in patients less than 11 years old. Performed at [facility], [place] [address] [place],  [address]     Pertinent Imaging today Plain films and CT images have been personally visualized and interpreted; radiology reports have been reviewed. Decision making incorporated into the Impression / Recommendations.  I spent more than 35 minutes for this patient encounter including review of prior medical records, coordination of care  with greater than 50% of time being face to face/counseling and discussing diagnostics/treatment plan with the patient/family.  Electronically signed by:   [name], MD Infectious Disease Physician [facility] for Infectious Disease Pager: [phone]

## 2020-11-05 NOTE — Discharge Summary (Addendum)
Physician Discharge Summary  [name] XNA:[id number] DOB: 1990/06/24 DOA: 11/03/2020  PCP: [name], MD  Admit date: 11/03/2020 Discharge date: 11/06/2020  Admitted From: Home Disposition: Home  Recommendations for Outpatient Follow-up:  Follow up with PCP in 1-2 weeks Follow-up with endocrinology, Dr. [name] in 1-2 weeks Reduce Lantus 8 units subcutaneously daily Outpatient antibiotic regimen changed from daptomycin/ceftriaxone to cefepime x7 days followed by cefazolin to complete antibiotic course for osteomyelitis and hospital-acquired pneumonia Follow-up with [facility] health, infectious disease as scheduled  Home Health: RN Equipment/Devices: PICC line that was already placed by [facility] health  Discharge Condition: Stable CODE STATUS: Full code Diet recommendation: Heart healthy/consistent carbohydrate diet  History of present illness:  [name] is a 30 year old female with past medical history significant for type 1 diabetes mellitus, diabetic gastroparesis, CKD stage IIIb, anxiety/depression, essential hypertension, cyclical vomiting syndrome, GERD, anemia of chronic disease, 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  As husband was getting ready for work, he noticed that patient was unresponsive.  On EMS arrival, blood sugar was noted to be 30.  Normal appetite with meal overnight and patient apparently took the correct amount of her normal insulin regimen.   Recent hospitalization at [facility] from  10/26/2020-11/02/2020 for left hallux amputation secondary to osteomyelitis. ID consulted. GBS and staph aureus. Was on vancomycin and zosyn for 2 days, cefepime x 1 day and then rocephin 2g and daptomycin. PICC line placed with plans for daptomycin and rocephin x 4 weeks. Has f/u appointment with [facility] ID, Dr Vanessa Cave, in 2 weeks. Also received  blood transfusion on 11/01/2020.   In the ED, hypothermic to 92.8, bp: 171/114, HR: 84, RR: 18, oxygen: 99% room air. Pertinent labs: glucose: 47.  WBC wnL, normal lactic acid, hgb 8.4 ( was 8.1 on discharge yesterday), creatinine: 1.38 (1.22-2.6) BUN: 28 (24-42). CXR with right multifocal Pna. Sepsis protocol initiated, not bolused as lactic acid was normal. IVF, vanc and cefepime given. 1 ampule of dextrose given and started on D10% infusion. Asked to admit for hypoglycemia and pneumonia.  Hospital course:  Acute metabolic encephalopathy: Resolved Patient unarousable at home per husband, was found to have a glucose of 30.  Suspect etiology multifactorial with hypoglycemic event at home as well as multifocal pneumonia findings on chest x-ray.  Patient was given IV dextrose and initially started on a D10 infusion.  Also started on empiric antibiotics for pneumonia.  Appears her mental status is now back to her normal baseline.   Hypothermia: Resolved Temperature 92.8 F on admission, likely secondary to significant hypoglycemia.  TSH within normal limits.  Patient initially required [facility] which has now been discontinued.  Temperature 98.1 F this morning; hypothermia now resolved.   Hypoglycemia: resolved Hx type 1 diabetes mellitus Patient was noted to have glucose 30 at home via EMS.  Received IV dextrose.  Initially required D10 infusion which has now been discontinued.  Patient reports normal eating habits and utilizing her baseline insulin regimen of Lantus 12 units daily with sliding scale insulin.  No significant gastroparesis or vomiting reported.  Unclear etiology.  Hemoglobin A1c 9.0, poorly controlled.  Follows with endocrinology outpatient, Dr. [name].  Lantus reduced to 8 units apparently daily and continue home insulin sliding scale.  Outpatient follow-up with endocrinology.   Multilobar pneumonia concerning for HCAP given recent hospitalization Patient with hypothermia  on  admission, but suspect that is related to her hypoglycemia.  Otherwise patient with out leukocytosis.  Lactic acid 0.6.  Chest x-ray with multilobar pneumonia and noted right lung.  Strep pneumo urinary antigen negative.  Also concern of possible lung injury with daptomycin use.  Infectious disease consulted and followed during hospital course.  Daptomycin and ceftriaxone were discontinued in favor of vancomycin and cefepime.     Osteomyelitis left toe s/p amputation Recently hospitalized at [facility] from 10/26/2020-11/02/2020 for left hallux amputation.  On outpatient daptomycin and ceftriaxone x4 weeks.  Has follow-up with infectious disease scheduled with Dr. Vanessa Liberal in 2 weeks.  PICC line already in place. Review of [facility] health cultures show MSSA and antibiotics were de-escalated to cefepime 2 g IV every 8 hours to complete 7-day course followed by cefazolin to complete antibiotic course for osteomyelitis.  Wound care clean the surgical area with NS, pat dry. Apply a small piece of Xeroform gauze over the surgical site and secure with Kerlix. Change daily.    Essential hypertension Amlodipine 10 mg p.o. daily, Carvedilol 12.5 mg p.o. twice daily, Hydralazine 50 mg p.o. twice daily, Furosemide 40 mg p.o. daily   Anxiety/depression BuSpar 5 mg p.o. 3 times daily, Lorazepam 0.5 mg p.o. twice daily   GERD: Protonix 40 mg p.o. twice daily   Diabetic gastroparesis Cyclical vomiting syndrome Reglan 5 mg p.o. twice daily with meals, Bentyl 20 mg every 8 hours as needed spasms, Zofran as needed   CKD stage IIIb Baseline creatinine 1.22-2.6.  Creatinine admission 1.38, at baseline.   Discharge Diagnoses:  Principal Problem:   Multifocal pneumonia Active Problems:   Hypertension associated with diabetes ([place])   GERD (gastroesophageal reflux disease)   Anxiety   Diabetic gastroparesis associated with type 1 diabetes mellitus (HCC)   Cannabinoid hyperemesis syndrome   CKD stage 3  due to type 1 diabetes mellitus ([place])   Type 1 diabetes mellitus with hypoglycemia without coma ([place])   Acute metabolic encephalopathy    Discharge Instructions  Discharge Instructions     [facility] pharmacist to adjust dose for Vancomycin, Aminoglycosides and other anti-infective therapies as requested by physician.   Complete by: As directed    [facility] to provide Cath Flo 31m   Complete by: As directed    Administer for PICC line occlusion and as ordered by physician for other access device issues.   Anaphylaxis Kit: Provided to treat any anaphylactic reaction to the medication being provided to the patient if First Dose or when requested by physician   Complete by: As directed    Epinephrine 121mml vial / amp: Administer 0.31m731m0.31ml65mubcutaneously once for moderate to severe anaphylaxis, nurse to call physician and pharmacy when reaction occurs and call 911 if needed for immediate care   Diphenhydramine 50mg56mIV vial: Administer 25-50mg 2mM PRN for first dose reaction, rash, itching, mild reaction, nurse to call physician and pharmacy when reaction occurs   Sodium Chloride 0.9% NS 500ml I46mdminister if needed for hypovolemic blood pressure drop or as ordered by physician after call to physician with anaphylactic reaction   Call MD for:  difficulty breathing, headache or visual disturbances   Complete by: As directed    Call MD for:  difficulty breathing, headache or visual disturbances   Complete by: As directed    Call MD for:  extreme fatigue   Complete by: As directed    Call MD for:  extreme fatigue  Complete by: As directed    Call MD for:  persistant dizziness or light-headedness   Complete by: As directed    Call MD for:  persistant dizziness or light-headedness   Complete by: As directed    Call MD for:  persistant nausea and vomiting   Complete by: As directed    Call MD for:  persistant nausea and vomiting   Complete by: As  directed    Call MD for:  severe uncontrolled pain   Complete by: As directed    Call MD for:  severe uncontrolled pain   Complete by: As directed    Call MD for:  temperature >100.4   Complete by: As directed    Call MD for:  temperature >100.4   Complete by: As directed    Change dressing on IV access line weekly and PRN   Complete by: As directed    Diet - low sodium heart healthy   Complete by: As directed    Discharge wound care:   Complete by: As directed    Clean the surgical area on the left foot with NS, pat dry. Apply a small piece of Xeroform gauze over the surgical site and secure with Kerlix. Change daily.   Discharge wound care:   Complete by: As directed    Clean the surgical area on the left foot with NS, pat dry. Apply a small piece of Xeroform gauze over the surgical site and secure with Kerlix. Change daily.   Flush IV access with Sodium Chloride 0.9% and Heparin 10 units/ml or 100 units/ml   Complete by: As directed    Home infusion instructions - [facility]   Complete by: As directed    Instructions: Flush IV access with Sodium Chloride 0.9% and Heparin 10units/ml or 100units/ml   Change dressing on IV access line: Weekly and PRN   Instructions Cath Flo 2mg: Administer for PICC Line occlusion and as ordered by physician for other access device   [facility] pharmacist to adjust dose for: Vancomycin, Aminoglycosides and other anti-infective therapies as requested by physician   Increase activity slowly   Complete by: As directed    Increase activity slowly   Complete by: As directed    Method of administration may be changed at the discretion of home infusion pharmacist based upon assessment of the patient and/or caregiver's ability to self-administer the medication ordered   Complete by: As directed       Allergies as of 11/06/2020       Reactions   Other Anaphylaxis   Reaction to brazil nuts   Lactose Intolerance (gi) Diarrhea         Medication List     STOP taking these medications    DAPTOmycin 500 MG injection Commonly known as: CUBICIN   HumuLIN N KwikPen 100 UNIT/ML Kiwkpen Generic drug: Insulin NPH (Human) ([place])   silver sulfADIAZINE 1 % cream Commonly known as: Silvadene       TAKE these medications    Albuterol Sulfate 108 (90 Base) MCG/ACT Aepb Commonly known as: PROAIR RESPICLICK Inhale 2 puffs into the lungs daily as needed.   amLODipine 10 MG tablet Commonly known as: NORVASC Take 1 tablet (10 mg total) by mouth daily.   busPIRone 5 MG tablet Commonly known as: BUSPAR Take 1 tablet (5 mg total) by mouth 3 (three) times daily.   carvedilol 12.5 MG tablet Commonly known as: COREG Take 1 tablet (12.5 mg total) by mouth 2 (two)   times daily with a meal.   ceFAZolin  IVPB Commonly known as: ANCEF Inject 2 g into the vein every 8 (eight) hours for 20 days. Indication:  Wound infection  First Dose: Yes Last Day of Therapy:  12/01/20 Labs - Once weekly:  CBC/D and BMP, Labs - Every other week:  ESR and CRP Method of administration: IV Push Method of administration may be changed at the discretion of home infusion pharmacist based upon assessment of the patient and/or caregiver's ability to self-administer the medication ordered. Start taking on: November 11, 2020   ceFEPime  IVPB Commonly known as: MAXIPIME Inject 2 g into the vein every 12 (twelve) hours for 5 days. Indication:  Pneumonia First Dose: Yes Last Day of Therapy:  11/10/20 Labs - Once weekly:  CBC/D and BMP, Labs - Every other week:  ESR and CRP Method of administration: IV Push Method of administration may be changed at the discretion of home infusion pharmacist based upon assessment of the patient and/or caregiver's ability to self-administer the medication ordered.   Dexcom G6 Sensor Misc USE AS DIRECTED EVERY 10 DAYS   Dexcom G6 Transmitter Misc USE AS DIRECTED TO  MONITOR  GLUCOSE  LEVELS   dicyclomine 20  MG tablet Commonly known as: BENTYL Take 1 tablet (20 mg total) by mouth every 8 (eight) hours as needed for spasms.   furosemide 40 MG tablet Commonly known as: Lasix Take 1 tablet (40 mg total) by mouth daily.   gabapentin 100 MG capsule Commonly known as: NEURONTIN Take 200 mg by mouth 3 (three) times daily.   GenTeal 0.25-0.3 % Gel Generic drug: Carboxymethylcell-Hypromellose Place 1 drop into both eyes daily as needed (dryeyes).   hydrALAZINE 50 MG tablet Commonly known as: APRESOLINE Take 50 mg by mouth 2 (two) times daily.   insulin glargine 100 UNIT/ML injection Commonly known as: LANTUS Inject 0.08 mLs (8 Units total) into the skin daily. What changed: how much to take   insulin lispro 100 UNIT/ML injection Commonly known as: HUMALOG Inject 0-5 Units into the skin 3 (three) times daily before meals. Sliding scale   loperamide 2 MG capsule Commonly known as: IMODIUM Take 6 mg by mouth daily as needed for diarrhea or loose stools.   ondansetron 4 MG disintegrating tablet Commonly known as: Zofran ODT Take 1 tablet (4 mg total) by mouth every 8 (eight) hours as needed for nausea or vomiting.   oxyCODONE 5 MG immediate release tablet Commonly known as: Roxicodone Take 1 tablet (5 mg total) by mouth 3 (three) times daily as needed for severe pain.   oxyCODONE-acetaminophen 10-325 MG tablet Commonly known as: PERCOCET Take 1 tablet by mouth every 4 (four) hours as needed for pain.   pantoprazole 40 MG tablet Commonly known as: PROTONIX Take 1 tablet (40 mg total) by mouth 2 (two) times daily.   PenTips 32G X 4 MM Misc Generic drug: Insulin Pen Needle USE AS DIRECTED WITH INSULIN PENS       ASK your doctor about these medications    cyclobenzaprine 10 MG tablet Commonly known as: FLEXERIL TAKE 1 TABLET(10 MG) BY MOUTH THREE TIMES DAILY AS NEEDED FOR MUSCLE SPASMS   EPINEPHrine 0.3 mg/0.3 mL Soaj injection Commonly known as: EPI-PEN Inject 0.3 mg into  the muscle once as needed (anaphylaxis/allergic reaction).   furosemide 40 MG tablet Commonly known as: LASIX TAKE 1 TABLET (40 MG TOTAL) BY MOUTH DAILY.   LORazepam 0.5 MG tablet Commonly known as: ATIVAN TAKE 1   TABLET(0.5 MG) BY MOUTH TWICE DAILY AS NEEDED FOR ANXIETY   metoCLOPramide 5 MG tablet Commonly known as: REGLAN Take 1 tablet (5 mg total) by mouth in the morning and at bedtime. Please make and keep an appointment for further refills.   mirtazapine 30 MG tablet Commonly known as: REMERON TAKE 1 TABLET BY MOUTH AT  BEDTIME               Discharge Care Instructions  (From admission, onward)           Start     Ordered   11/06/20 0000  Discharge wound care:       Comments: Clean the surgical area on the left foot with NS, pat dry. Apply a small piece of Xeroform gauze over the surgical site and secure with Kerlix. Change daily.   11/06/20 0817   11/05/20 0000  Change dressing on IV access line weekly and PRN  (Home infusion instructions - [facility] )        11/05/20 1619   11/05/20 0000  Discharge wound care:       Comments: Clean the surgical area on the left foot with NS, pat dry. Apply a small piece of Xeroform gauze over the surgical site and secure with Kerlix. Change daily.   11/05/20 1627            Follow-up Information     Parker, Caleb M, MD Follow up in 1 week(s).   Specialty: Family Medicine Contact information: [address] [place] [address] [phone]         Ellison, Sean, MD Follow up in 1 week(s).   Specialty: Endocrinology Contact information: [address]   [address] [phone]                Allergies  Allergen Reactions   Other Anaphylaxis    Reaction to brazil nuts    Lactose Intolerance (Gi) Diarrhea    Consultations: Infectious disease   Procedures/Studies: DG Chest Port 1 View  Result Date: 11/03/2020 CLINICAL DATA:  30-year-old female with sepsis.  Found unresponsive by husband. EXAM: PORTABLE CHEST 1 VIEW COMPARISON:  Chest x-ray 09/25/2020. FINDINGS: There is a right upper extremity PICC with tip terminating in the right atrium. Patchy multifocal airspace consolidation throughout the right lung, most confluent in the right upper lobe. Left lung appears clear. No pleural effusions. No pneumothorax. No evidence of pulmonary edema. Heart size is normal. Upper mediastinal contours are within normal limits. IMPRESSION: 1. Multilobar pneumonia in the right lung. 2. Tip of right upper extremity PICC is in the right atrium. Electronically Signed   By: Daniel  Entrikin M.D.   On: 11/03/2020 11:21   DG Foot Complete Left  Result Date: 11/03/2020 CLINICAL DATA:  Recent amputation, concern for osteomyelitis. The patient is hypoglycemic. EXAM: LEFT FOOT - COMPLETE 3+ VIEW COMPARISON:  Left foot radiograph dated 10/31/2020. FINDINGS: The patient is status post a first ray resection at the metatarsal phalangeal joint. There is no evidence of acute fracture or dislocation. No focal osseous demineralization to suggest osteomyelitis. Degenerative changes involving the navicular bone are redemonstrated. Soft tissues are unremarkable. IMPRESSION: No evidence of osteomyelitis. Electronically Signed   By: Tyler  Litton M.D.   On: 11/03/2020 11:22   XR Tibia/Fibula Right  Result Date: 10/31/2020 Radiographs of right tibia and fibula are negative for fracture. Transtibial amputation.    Subjective: Patient seen examined at bedside, resting comfortably.  No complaints this morning.    Hopes for discharge home this afternoon.  ID recommends changing antibiotics from daptomycin/ceftriaxone to cefepime x7 days followed by cefazolin to complete antibiotic course for pneumonia and osteomyelitis.  Patient with no other questions or concerns at this time.  Discharging home.  Denies headache, no fever/chills/night sweats, no nausea cefonicid diarrhea, no chest pain, palpitations, no  shortness of breath, no abdominal pain, no weakness, no fatigue, no paresthesias.  No acute events overnight per nursing staff.  Discharge Exam: Vitals:   11/06/20 0457 11/06/20 0458  BP:  (!) 169/106  Pulse: 98   Resp: 18   Temp:    SpO2: 97%    Vitals:   11/05/20 2015 11/06/20 0344 11/06/20 0457 11/06/20 0458  BP: (!) 164/83 (!) 178/118  (!) 169/106  Pulse:  95 98   Resp: 17 19 18   Temp: 98 F (36.7 C) 98.2 F (36.8 C)    TempSrc: Axillary Axillary    SpO2: 98% 98% 97%   Weight:      Height:        General: Pt is alert, awake, not in acute distress, appears older than stated age Cardiovascular: RRR, S1/S2 +, no rubs, no gallops Respiratory: CTA bilaterally, no wheezing, no rhonchi Abdominal: Soft, NT, ND, bowel sounds + Extremities: Right BKA noted, noted left great toe amputation with dressing in place, no lower extremity edema, moves all extremities independently    The results of significant diagnostics from this hospitalization (including imaging, microbiology, ancillary and laboratory) are listed below for reference.     Microbiology: Recent Results (from the past 240 hour(s))  Resp Panel by RT-PCR (Flu A&B, Covid) Nasopharyngeal Swab     Status: None   Collection Time: 11/03/20 10:41 AM   Specimen: Nasopharyngeal Swab; Nasopharyngeal(NP) swabs in vial transport medium  Result Value Ref Range Status   SARS Coronavirus 2 by RT PCR NEGATIVE NEGATIVE Final    Comment: (NOTE) SARS-CoV-2 target nucleic acids are NOT DETECTED.  The SARS-CoV-2 RNA is generally detectable in upper respiratory specimens during the acute phase of infection. The lowest concentration of SARS-CoV-2 viral copies this assay can detect is 138 copies/mL. A negative result does not preclude SARS-Cov-2 infection and should not be used as the sole basis for treatment or other patient management decisions. A negative result may occur with  improper specimen collection/handling, submission of  specimen other than nasopharyngeal swab, presence of viral mutation(s) within the areas targeted by this assay, and inadequate number of viral copies(<138 copies/mL). A negative result must be combined with clinical observations, patient history, and epidemiological information. The expected result is Negative.  Fact Sheet for Patients:  https://www.fda.gov/media/152166/download  Fact Sheet for Healthcare Providers:  https://www.fda.gov/media/152162/download  This test is no t yet approved or cleared by the [place] FDA and  has been authorized for detection and/or diagnosis of SARS-CoV-2 by FDA under an Emergency Use Authorization (EUA). This EUA will remain  in effect (meaning this test can be used) for the duration of the COVID-19 declaration under Section 564(b)(1) of the Act, 21 U.S.C.section 360bbb-3(b)(1), unless the authorization is terminated  or revoked sooner.       Influenza A by PCR NEGATIVE NEGATIVE Final   Influenza B by PCR NEGATIVE NEGATIVE Final    Comment: (NOTE) The Xpert Xpress SARS-CoV-2/FLU/RSV plus assay is intended as an aid in the diagnosis of influenza from Nasopharyngeal swab specimens and should not be used as a sole basis for treatment. Nasal washings and aspirates are unacceptable for Xpert Xpress   SARS-CoV-2/FLU/RSV testing.  Fact Sheet for Patients: https://www.fda.gov/media/152166/download  Fact Sheet for Healthcare Providers: https://www.fda.gov/media/152162/download  This test is not yet approved or cleared by the [place] FDA and has been authorized for detection and/or diagnosis of SARS-CoV-2 by FDA under an Emergency Use Authorization (EUA). This EUA will remain in effect (meaning this test can be used) for the duration of the COVID-19 declaration under Section 564(b)(1) of the Act, 21 U.S.C. section 360bbb-3(b)(1), unless the authorization is terminated or revoked.  Performed at [facility], [address]  [place], [place] [address]   Urine Culture     Status: None   Collection Time: 11/03/20 10:41 AM   Specimen: In/Out Cath Urine  Result Value Ref Range Status   Specimen Description IN/OUT CATH URINE  Final   Special Requests NONE  Final   Culture   Final    NO GROWTH Performed at [facility], [address] [place], [place] [address]    Report Status 11/04/2020 FINAL  Final  Blood Culture (routine x 2)     Status: None (Preliminary result)   Collection Time: 11/03/20  1:24 PM   Specimen: BLOOD  Result Value Ref Range Status   Specimen Description BLOOD LEFT ANTECUBITAL  Final   Special Requests   Final    BOTTLES DRAWN AEROBIC AND ANAEROBIC Blood Culture adequate volume   Culture   Final    NO GROWTH 3 DAYS Performed at [facility], [address] [place], [place] [address]    Report Status PENDING  Incomplete  MRSA Next Gen by PCR, Nasal     Status: None   Collection Time: 11/04/20 11:32 AM   Specimen: Nasal Mucosa; Nasal Swab  Result Value Ref Range Status   MRSA by PCR Next Gen NOT DETECTED NOT DETECTED Final    Comment: (NOTE) The GeneXpert MRSA Assay (FDA approved for NASAL specimens only), is one component of a comprehensive MRSA colonization surveillance program. It is not intended to diagnose MRSA infection nor to guide or monitor treatment for MRSA infections. Test performance is not FDA approved in patients less than 2 years old. Performed at [facility], [address] , [place] [address]      Labs: BNP (last 3 results) Recent Labs    05/12/20 0041 05/16/20 1515 06/04/20 0420  BNP 399.9* 757.2* 444.7*   Basic Metabolic Panel: Recent Labs  Lab 11/03/20 1241 11/04/20 0452 11/05/20 0410  NA 137 134* 135  K 4.2 4.1 4.2  CL 110 107 110  CO2 21* 20* 20*  GLUCOSE 47* 189* 164*  BUN 28* 24* 21*  CREATININE 1.38* 1.36* 1.13*  CALCIUM 8.8* 8.3* 7.9*   Liver Function Tests: Recent Labs  Lab 11/03/20 1241  AST 31  ALT 26   ALKPHOS 76  BILITOT 0.5  PROT 6.5  ALBUMIN 1.7*   No results for input(s): LIPASE, AMYLASE in the last 168 hours. No results for input(s): AMMONIA in the last 168 hours. CBC: Recent Labs  Lab 11/03/20 1214 11/04/20 0452 11/05/20 0800  WBC 10.4 13.2* 11.3*  NEUTROABS 8.8*  --   --   HGB 8.4* 8.2* 7.5*  HCT 26.8* 25.7* 23.7*  MCV 94.7 93.8 94.0  PLT 270 282 248   Cardiac Enzymes: No results for input(s): CKTOTAL, CKMB, CKMBINDEX, TROPONINI in the last 168 hours. BNP: Invalid input(s): POCBNP CBG: Recent Labs  Lab 11/05/20 0757 11/05/20 1222 11/05/20 1557 11/05/20 2128 11/06/20 0749  GLUCAP   167* 112* 172* 183* 143*   D-Dimer No results for input(s): DDIMER in the last 72 hours. Hgb A1c No results for input(s): HGBA1C in the last 72 hours. Lipid Profile No results for input(s): CHOL, HDL, LDLCALC, TRIG, CHOLHDL, LDLDIRECT in the last 72 hours. Thyroid function studies Recent Labs    11/03/20 1241  TSH 1.472   Anemia work up No results for input(s): VITAMINB12, FOLATE, FERRITIN, TIBC, IRON, RETICCTPCT in the last 72 hours. Urinalysis    Component Value Date/Time   COLORURINE YELLOW 11/03/2020 1322   APPEARANCEUR HAZY (A) 11/03/2020 1322   LABSPEC 1.018 11/03/2020 1322   PHURINE 5.0 11/03/2020 1322   GLUCOSEU 50 (A) 11/03/2020 1322   HGBUR SMALL (A) 11/03/2020 1322   BILIRUBINUR NEGATIVE 11/03/2020 1322   BILIRUBINUR negative 01/02/2015 1717   KETONESUR NEGATIVE 11/03/2020 1322   PROTEINUR >=300 (A) 11/03/2020 1322   UROBILINOGEN 0.2 01/13/2015 0223   NITRITE NEGATIVE 11/03/2020 1322   LEUKOCYTESUR NEGATIVE 11/03/2020 1322   Sepsis Labs Invalid input(s): PROCALCITONIN,  WBC,  LACTICIDVEN Microbiology Recent Results (from the past 240 hour(s))  Resp Panel by RT-PCR (Flu A&B, Covid) Nasopharyngeal Swab     Status: None   Collection Time: 11/03/20 10:41 AM   Specimen: Nasopharyngeal Swab; Nasopharyngeal(NP) swabs in vial transport medium  Result Value  Ref Range Status   SARS Coronavirus 2 by RT PCR NEGATIVE NEGATIVE Final    Comment: (NOTE) SARS-CoV-2 target nucleic acids are NOT DETECTED.  The SARS-CoV-2 RNA is generally detectable in upper respiratory specimens during the acute phase of infection. The lowest concentration of SARS-CoV-2 viral copies this assay can detect is 138 copies/mL. A negative result does not preclude SARS-Cov-2 infection and should not be used as the sole basis for treatment or other patient management decisions. A negative result may occur with  improper specimen collection/handling, submission of specimen other than nasopharyngeal swab, presence of viral mutation(s) within the areas targeted by this assay, and inadequate number of viral copies(<138 copies/mL). A negative result must be combined with clinical observations, patient history, and epidemiological information. The expected result is Negative.  Fact Sheet for Patients:  EntrepreneurPulse.com.au  Fact Sheet for Healthcare Providers:  IncredibleEmployment.be  This test is no t yet approved or cleared by the [place] FDA and  has been authorized for detection and/or diagnosis of SARS-CoV-2 by FDA under an Emergency Use Authorization (EUA). This EUA will remain  in effect (meaning this test can be used) for the duration of the COVID-19 declaration under Section 564(b)(1) of the Act, 21 U.S.C.section 360bbb-3(b)(1), unless the authorization is terminated  or revoked sooner.       Influenza A by PCR NEGATIVE NEGATIVE Final   Influenza B by PCR NEGATIVE NEGATIVE Final    Comment: (NOTE) The Xpert Xpress SARS-CoV-2/FLU/RSV plus assay is intended as an aid in the diagnosis of influenza from Nasopharyngeal swab specimens and should not be used as a sole basis for treatment. Nasal washings and aspirates are unacceptable for Xpert Xpress SARS-CoV-2/FLU/RSV testing.  Fact Sheet for  Patients: EntrepreneurPulse.com.au  Fact Sheet for Healthcare Providers: IncredibleEmployment.be  This test is not yet approved or cleared by the [place] FDA and has been authorized for detection and/or diagnosis of SARS-CoV-2 by FDA under an Emergency Use Authorization (EUA). This EUA will remain in effect (meaning this test can be used) for the duration of the COVID-19 declaration under Section 564(b)(1) of the Act, 21 U.S.C. section 360bbb-3(b)(1), unless the authorization is terminated or revoked.  Performed at [facility], [place] [address] [place], [place] [address]   Urine Culture     Status: None   Collection Time: 11/03/20 10:41 AM   Specimen: In/Out Cath Urine  Result Value Ref Range Status   Specimen Description IN/OUT CATH URINE  Final   Special Requests NONE  Final   Culture   Final    NO GROWTH Performed at [facility], [place] [address], [place], [place] [address]    Report Status 11/04/2020 FINAL  Final  Blood Culture (routine x 2)     Status: None (Preliminary result)   Collection Time: 11/03/20  1:24 PM   Specimen: BLOOD  Result Value Ref Range Status   Specimen Description BLOOD LEFT ANTECUBITAL  Final   Special Requests   Final    BOTTLES DRAWN AEROBIC AND ANAEROBIC Blood Culture adequate volume   Culture   Final    NO GROWTH 3 DAYS Performed at [facility], [place] [address] [place], [place] [address]    Report Status PENDING  Incomplete  MRSA Next Gen by PCR, Nasal     Status: None   Collection Time: 11/04/20 11:32 AM   Specimen: Nasal Mucosa; Nasal Swab  Result Value Ref Range Status   MRSA by PCR Next Gen NOT DETECTED NOT DETECTED Final    Comment: (NOTE) The GeneXpert MRSA Assay (FDA approved for NASAL specimens only), is one component of a comprehensive MRSA colonization surveillance program. It is not intended to diagnose MRSA infection nor to guide or monitor treatment for MRSA  infections. Test performance is not FDA approved in patients less than 75 years old. Performed at [facility], [place] [address] [place], [place] [address]      Time coordinating discharge: Over 30 minutes  SIGNED:   Joffre Lucks J [place] DO  [facility] 11/06/2020, 8:17 AM

## 2020-11-05 NOTE — TOC Progression Note (Signed)
Transition of Care [facility]) - Progression Note    Patient Details  Name: [name] MRN: [id number] Date of Birth: 1990-12-27  Transition of Care [facility]) CM/SW [place], [place] Phone Number: 11/05/2020, 4:40 PM  Clinical Narrative:     CSW notified [name] with [employer] about the update in patients antibiotics. They were unable to get those medications this late in the day, so plan is to DC after morning dose. [employer] following and TOC following.       Expected Discharge Plan and Services           Expected Discharge Date: 11/05/20                                     Social Determinants of Health (SDOH) Interventions    Readmission Risk Interventions Readmission Risk Prevention Plan 03/22/2020 09/26/2019 07/30/2018  Transportation Screening Complete Complete Complete  PCP or Specialist Appt within 3-5 Days - - Complete  HRI or Home Care Consult - - Not Complete  HRI or Home Care Consult comments - - NA  Social Work Consult for [place] Planning/Counseling - - Not Complete  SW consult not completed comments - - NA  Palliative Care Screening - - Not Applicable  Medication Review Press photographer) Complete - (No Data)  PCP or Specialist appointment within 3-5 days of discharge Complete Complete -  [place] or Home Care Consult Complete Complete -  SW Recovery Care/Counseling Consult Complete Complete -  Palliative Care Screening Not Applicable Not Applicable -  [place] Not Applicable Not Applicable -  Some recent data might be hidden

## 2020-11-05 NOTE — Progress Notes (Signed)
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  As husband was getting ready for work, he noticed that patient was unresponsive.  On EMS arrival, blood sugar was noted to be 30.  Normal appetite with meal overnight and patient apparently took the correct amount of her normal insulin regimen.  Recent hospitalization at [facility] from  10/26/2020-11/02/2020 for left hallux amputation secondary to osteomyelitis. ID consulted. GBS and staph aureus. Was on vancomycin and zosyn for 2 days, cefepime x 1 day and then rocephin 2g and daptomycin. PICC line placed with plans for daptomycin and rocephin x 4 weeks. Has f/u appointment with [facility] ID, Dr Vanessa Home, in 2 weeks. Also received blood transfusion on 11/01/2020.   In the ED, hypothermic to 92.8, bp: 171/114, HR: 84, RR: 18, oxygen: 99% room air. Pertinent labs: glucose: 47.  WBC wnL, normal lactic acid, hgb 8.4 ( was 8.1 on discharge yesterday), creatinine: 1.38 (1.22-2.6) BUN: 28 (24-42). CXR with right multifocal Pna. Sepsis protocol initiated, not bolused as lactic acid was normal. IVF, vanc and cefepime given. 1 ampule of dextrose given and started on D10% infusion. Asked to admit for hypoglycemia and pneumonia.   Assessment & Plan:   Principal Problem:   Multifocal pneumonia Active Problems:   Hypertension associated with diabetes ([place])   GERD (gastroesophageal reflux disease)   Anxiety   Diabetic gastroparesis associated with type 1 diabetes mellitus (HCC)   Cannabinoid  hyperemesis syndrome   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 (HCC)   Type 1 diabetes mellitus with hypoglycemia without coma (HCC)   Acute metabolic encephalopathy   Acute metabolic encephalopathy: Resolved Patient unarousable at home per husband, was found to have a glucose of 30.  Suspect etiology multifactorial with hypoglycemic event at home as well as multifocal pneumonia findings on chest x-ray.  Patient was given IV dextrose and initially started on a D10 infusion.  Also started on empiric antibiotics for pneumonia.  Appears her mental status is now back to her normal baseline. --Continue treatment as below  Hypothermia: Resolved Temperature 92.8 F on admission, likely secondary to significant hypoglycemia.  TSH within normal limits.  Patient initially required [facility] which has now been discontinued.  Temperature 98.1 F this morning; hypothermia now resolved. --Continue to monitor temperature per unit routine  Hypoglycemia: resolved Hx type 1 diabetes mellitus Patient was noted to have glucose 30 at home via EMS.  Received IV dextrose.  Initially required D10 infusion which has now been discontinued.  Patient reports normal eating habits and utilizing her baseline insulin regimen of Lantus 12 units daily with sliding scale insulin.  No significant gastroparesis or vomiting reported.  Unclear etiology.  Hemoglobin A1c 9.0, poorly controlled.  Follows with endocrinology outpatient, Dr. [name]. --Continue Semglee at reduced dose, 6 units [place] daily --SSI for further coverage --CBG before every meal/at bedtime  Multilobar pneumonia concerning for HCAP given recent hospitalization Patient with hypothermia on admission, but suspect that is related to her hypoglycemia.  Otherwise patient with out leukocytosis.  Lactic acid 0.6.  Chest  x-ray with multilobar pneumonia and noted right lung.  Strep pneumo urinary antigen negative.  Also concern of possible lung injury with daptomycin  use. --ID following, appreciate assistance --Daptomycin changed to vancomycin, pharmacy consulted for dosing/monitoring --Continue cefepime 2 g IV every 12 hours  Osteomyelitis left toe s/p amputation Recently hospitalized at [facility] from 10/26/2020-11/02/2020 for left hallux amputation.  On outpatient daptomycin and ceftriaxone x4 weeks.  Has follow-up with infectious disease scheduled with Dr. Vanessa Seaman in 2 weeks.  PICC line already in place. --ID following --On vancomycin/cefepime as above --xycodone 5 mg q4h prn moderate pain --dilaudid 0.39m IV q3h prn severe breakthrough pain --Clean the surgical area with NS, pat dry. Apply a small piece of Xeroform gauze over the surgical site and secure with Kerlix. Change daily.   Essential hypertension --Amlodipine 10 mg p.o. daily --Carvedilol 12.5 mg p.o. twice daily --Hydralazine 50 mg p.o. twice daily --Furosemide 40 mg p.o. daily  Anxiety/depression --BuSpar 5 mg p.o. 3 times daily --Lorazepam 0.5 mg p.o. twice daily  GERD: Protonix 40 mg p.o. twice daily  Diabetic gastroparesis Cyclical vomiting syndrome --Reglan 5 mg p.o. twice daily with meals --Bentyl 20 mg every 8 hours as needed spasms --Zofran as needed  CKD stage IIIb Baseline creatinine 1.22-2.6.  Creatinine admission 1.38, at baseline. --Avoid nephrotoxins, renally dose all medications --Repeat BMP in the a.m.   DVT prophylaxis: enoxaparin (LOVENOX) injection 40 mg Start: 11/03/20 1630   Code Status: Full Code Family Communication: No family present at bedside this morning  Disposition Plan:  Level of care: Telemetry Medical Status is: Inpatient  Remains inpatient appropriate because:Ongoing diagnostic testing needed not appropriate for outpatient work up, Unsafe d/c plan, IV treatments appropriate due to intensity of illness or inability to take PO, and Inpatient level of care appropriate due to severity of illness  Dispo:  Patient From: Home  Planned  Disposition: Home  Medically stable for discharge: No     Consultants:  Infectious disease  Procedures:  None  Antimicrobials:  Vancomycin 8/7>> Cefepime 8/6>>   Subjective: Patient seen examined at bedside, resting comfortably.  Pain to left foot controlled.  Want to know when she could go home.  Awaiting further recommendations per infectious disease now change daptomycin to vancomycin.  Oxygenating well on room air.  No other questions or concerns at this time. Denies headache, no visual changes, no fever/chills/night sweats, no current nausea/vomiting/diarrhea, no chest pain, no palpitations, no shortness of breath, no abdominal pain, no weakness, no fatigue, no paresthesias.  No acute events overnight per nurse staff.  Objective: Vitals:   11/05/20 0413 11/05/20 0545 11/05/20 0735 11/05/20 1130  BP: (!) 170/114 136/73 (!) 161/92 137/79  Pulse: 97  100 95  Resp: 16  10 18   Temp: 97.7 F (36.5 C)     TempSrc: Oral     SpO2: 100%  95% 99%  Weight:      Height:        Intake/Output Summary (Last 24 hours) at 11/05/2020 1224 Last data filed at 11/05/2020 00165Gross per 24 hour  Intake 2756.7 ml  Output 450 ml  Net 2306.7 ml   Filed Weights   11/03/20 1030 11/03/20 1538  Weight: 63.5 kg 68 kg    Examination:  General exam: Appears calm and comfortable, appears older than stated age Respiratory system: Clear to auscultation. Respiratory effort normal.  On room air Cardiovascular system: S1 & S2 heard, RRR. No JVD, murmurs, rubs, gallops or clicks. No pedal edema. Gastrointestinal  system: Abdomen is nondistended, soft and nontender. No organomegaly or masses felt. Normal bowel sounds heard. Central nervous system: Alert and oriented. No focal neurological deficits. Extremities: Right BKA noted, noted left great toe amputation, no lower extremity edema, moves all extremities independently Skin: No rashes, lesions or ulcers Psychiatry: Judgement and insight appear poor.   Mood & affect appropriate.     Data Reviewed: I have personally reviewed following labs and imaging studies  CBC: Recent Labs  Lab 11/03/20 1214 11/04/20 0452 11/05/20 0800  WBC 10.4 13.2* 11.3*  NEUTROABS 8.8*  --   --   HGB 8.4* 8.2* 7.5*  HCT 26.8* 25.7* 23.7*  MCV 94.7 93.8 94.0  PLT 270 282 165   Basic Metabolic Panel: Recent Labs  Lab 11/03/20 1241 11/04/20 0452 11/05/20 0410  NA 137 134* 135  K 4.2 4.1 4.2  CL 110 107 110  CO2 21* 20* 20*  GLUCOSE 47* 189* 164*  BUN 28* 24* 21*  CREATININE 1.38* 1.36* 1.13*  CALCIUM 8.8* 8.3* 7.9*   GFR: Estimated Creatinine Clearance: 70.1 mL/min (A) (by C-G formula based on SCr of 1.13 mg/dL (H)). Liver Function Tests: Recent Labs  Lab 11/03/20 1241  AST 31  ALT 26  ALKPHOS 76  BILITOT 0.5  PROT 6.5  ALBUMIN 1.7*   No results for input(s): LIPASE, AMYLASE in the last 168 hours. No results for input(s): AMMONIA in the last 168 hours. Coagulation Profile: Recent Labs  Lab 11/03/20 1241  INR 1.0   Cardiac Enzymes: No results for input(s): CKTOTAL, CKMB, CKMBINDEX, TROPONINI in the last 168 hours. BNP (last 3 results) No results for input(s): PROBNP in the last 8760 hours. HbA1C: No results for input(s): HGBA1C in the last 72 hours. CBG: Recent Labs  Lab 11/04/20 0945 11/04/20 1205 11/04/20 1629 11/04/20 2045 11/05/20 0757  GLUCAP 145* 189* 174* 138* 167*   Lipid Profile: No results for input(s): CHOL, HDL, LDLCALC, TRIG, CHOLHDL, LDLDIRECT in the last 72 hours. Thyroid Function Tests: Recent Labs    11/03/20 1241  TSH 1.472   Anemia Panel: No results for input(s): VITAMINB12, FOLATE, FERRITIN, TIBC, IRON, RETICCTPCT in the last 72 hours. Sepsis Labs: Recent Labs  Lab 11/03/20 1241 11/04/20 0452 11/05/20 0410  PROCALCITON  --  6.03 5.56  LATICACIDVEN 0.6  --   --     Recent Results (from the past 240 hour(s))  Resp Panel by RT-PCR (Flu A&B, Covid) Nasopharyngeal Swab     Status: None    Collection Time: 11/03/20 10:41 AM   Specimen: Nasopharyngeal Swab; Nasopharyngeal(NP) swabs in vial transport medium  Result Value Ref Range Status   SARS Coronavirus 2 by RT PCR NEGATIVE NEGATIVE Final    Comment: (NOTE) SARS-CoV-2 target nucleic acids are NOT DETECTED.  The SARS-CoV-2 RNA is generally detectable in upper respiratory specimens during the acute phase of infection. The lowest concentration of SARS-CoV-2 viral copies this assay can detect is 138 copies/mL. A negative result does not preclude SARS-Cov-2 infection and should not be used as the sole basis for treatment or other patient management decisions. A negative result may occur with  improper specimen collection/handling, submission of specimen other than nasopharyngeal swab, presence of viral mutation(s) within the areas targeted by this assay, and inadequate number of viral copies(<138 copies/mL). A negative result must be combined with clinical observations, patient history, and epidemiological information. The expected result is Negative.  Fact Sheet for Patients:  EntrepreneurPulse.com.au  Fact Sheet for Healthcare Providers:  IncredibleEmployment.be  This  test is no t yet approved or cleared by the [place] and  has been authorized for detection and/or diagnosis of SARS-CoV-2 by FDA under an Emergency Use Authorization (EUA). This EUA will remain  in effect (meaning this test can be used) for the duration of the COVID-19 declaration under Section 564(b)(1) of the Act, 21 U.S.C.section 360bbb-3(b)(1), unless the authorization is terminated  or revoked sooner.       Influenza A by PCR NEGATIVE NEGATIVE Final   Influenza B by PCR NEGATIVE NEGATIVE Final    Comment: (NOTE) The Xpert Xpress SARS-CoV-2/FLU/RSV plus assay is intended as an aid in the diagnosis of influenza from Nasopharyngeal swab specimens and should not be used as a sole basis for treatment.  Nasal washings and aspirates are unacceptable for Xpert Xpress SARS-CoV-2/FLU/RSV testing.  Fact Sheet for Patients: EntrepreneurPulse.com.au  Fact Sheet for Healthcare Providers: IncredibleEmployment.be  This test is not yet approved or cleared by the [place] FDA and has been authorized for detection and/or diagnosis of SARS-CoV-2 by FDA under an Emergency Use Authorization (EUA). This EUA will remain in effect (meaning this test can be used) for the duration of the COVID-19 declaration under Section 564(b)(1) of the Act, 21 U.S.C. section 360bbb-3(b)(1), unless the authorization is terminated or revoked.  Performed at [facility], [place] [address], [place], [place] [address]   Urine Culture     Status: None   Collection Time: 11/03/20 10:41 AM   Specimen: In/Out Cath Urine  Result Value Ref Range Status   Specimen Description IN/OUT CATH URINE  Final   Special Requests NONE  Final   Culture   Final    NO GROWTH Performed at [facility], [place] [address], [place], [place] [address]    Report Status 11/04/2020 FINAL  Final  Blood Culture (routine x 2)     Status: None (Preliminary result)   Collection Time: 11/03/20  1:24 PM   Specimen: BLOOD  Result Value Ref Range Status   Specimen Description BLOOD LEFT ANTECUBITAL  Final   Special Requests   Final    BOTTLES DRAWN AEROBIC AND ANAEROBIC Blood Culture adequate volume   Culture   Final    NO GROWTH < 24 HOURS Performed at [facility], [place] [address], [place], [place] [address]    Report Status PENDING  Incomplete  MRSA Next Gen by PCR, Nasal     Status: None   Collection Time: 11/04/20 11:32 AM   Specimen: Nasal Mucosa; Nasal Swab  Result Value Ref Range Status   MRSA by PCR Next Gen NOT DETECTED NOT DETECTED Final    Comment: (NOTE) The GeneXpert MRSA Assay (FDA approved for NASAL specimens only), is one component of a comprehensive MRSA colonization  surveillance program. It is not intended to diagnose MRSA infection nor to guide or monitor treatment for MRSA infections. Test performance is not FDA approved in patients less than 31 years old. Performed at [facility], [place] [address], [place],  [address]          Radiology Studies: No results found.      Scheduled Meds:  amLODipine  10 mg Oral Daily   busPIRone  5 mg Oral TID   carvedilol  12.5 mg Oral BID WC   Chlorhexidine Gluconate Cloth  6 each Topical Daily   enoxaparin (LOVENOX) injection  40 mg Subcutaneous Q24H   furosemide  40 mg Oral Daily   hydrALAZINE  50  mg Oral BID   insulin aspart  0-6 Units Subcutaneous TID WC   insulin glargine-yfgn  6 Units Subcutaneous QHS   LORazepam  0.5 mg Oral BID   metoCLOPramide  5 mg Oral BID WC   mirtazapine  30 mg Oral QHS   pantoprazole  40 mg Oral BID   sodium chloride flush  10-40 mL Intracatheter Q12H   Continuous Infusions:  ceFEPime (MAXIPIME) IV 2 g (11/05/20 0400)     LOS: 1 day    Time spent: 41 minutes spent on chart review, discussion with nursing staff, consultants, updating family and interview/physical exam; more than 50% of that time was spent in counseling and/or coordination of care.    Raye Wiens J [place] DO [facility] Available via Epic secure chat 7am-7pm After these hours, please refer to coverage provider listed on [email or web address] 11/05/2020, 12:24 PM

## 2020-11-05 NOTE — Progress Notes (Addendum)
PHARMACY CONSULT NOTE FOR:  OUTPATIENT  PARENTERAL ANTIBIOTIC THERAPY (OPAT)  Indication: Pneumonia/osteomyelitis  Regimen: Cefepime 2 gm IV Q 12 hours through 8/13 Then  Cefazolin 2 gm IV every 8 hours starting 8/14 through 12/01/20    Thank you for allowing pharmacy to be a part of this patient's care.  Jimmy Footman, PharmD, BCPS, BCIDP Infectious Diseases Clinical Pharmacist Phone: [phone] 11/05/2020, 4:20 PM

## 2020-11-06 DIAGNOSIS — L02611 Cutaneous abscess of right foot: Secondary | ICD-10-CM | POA: Diagnosis not present

## 2020-11-06 DIAGNOSIS — M86272 Subacute osteomyelitis, left ankle and foot: Secondary | ICD-10-CM | POA: Diagnosis not present

## 2020-11-06 DIAGNOSIS — M86171 Other acute osteomyelitis, right ankle and foot: Secondary | ICD-10-CM | POA: Diagnosis not present

## 2020-11-06 DIAGNOSIS — E10628 Type 1 diabetes mellitus with other skin complications: Secondary | ICD-10-CM | POA: Diagnosis not present

## 2020-11-06 DIAGNOSIS — I96 Gangrene, not elsewhere classified: Secondary | ICD-10-CM | POA: Diagnosis not present

## 2020-11-06 DIAGNOSIS — J80 Acute respiratory distress syndrome: Secondary | ICD-10-CM | POA: Diagnosis not present

## 2020-11-06 DIAGNOSIS — S91302A Unspecified open wound, left foot, initial encounter: Secondary | ICD-10-CM | POA: Diagnosis not present

## 2020-11-06 LAB — GLUCOSE, CAPILLARY: Glucose-Capillary: 143 mg/dL — ABNORMAL HIGH (ref 70–99)

## 2020-11-06 LAB — LEGIONELLA PNEUMOPHILA SEROGP 1 UR AG: L. pneumophila Serogp 1 Ur Ag: NEGATIVE

## 2020-11-06 MED ORDER — LABETALOL HCL 5 MG/ML IV SOLN
20.0000 mg | INTRAVENOUS | Status: DC | PRN
Start: 2020-11-06 — End: 2020-11-06

## 2020-11-06 MED ORDER — HYDRALAZINE HCL 20 MG/ML IJ SOLN
10.0000 mg | Freq: Four times a day (QID) | INTRAMUSCULAR | Status: DC | PRN
Start: 2020-11-06 — End: 2020-11-06
  Administered 2020-11-06: 10 mg via INTRAVENOUS
  Filled 2020-11-06: qty 1

## 2020-11-06 NOTE — TOC Transition Note (Signed)
Transition of Care [facility]) - CM/SW Discharge Note   Patient Details  Name: [name] MRN: [id number] Date of Birth: 14-Jan-1991  Transition of Care [facility]) CM/SW Contact:  Geralynn Ochs, LCSW Phone Number: 11/06/2020, 11:00 AM   Clinical Narrative:   CSW spoke with [name] with [employer] to confirm that patient was set to go home with IV antibiotics. Nurse is scheduled to arrive to patient's home between 2 and 4 for her 3 pm dose this afternoon. CSW updated bedside RN, who will discharge patient home. No other TOC needs at this time.    Final next level of care: [place] Barriers to Discharge: Barriers Resolved   Patient Goals and CMS Choice Patient states their goals for this hospitalization and ongoing recovery are:: to get home CMS Medicare.gov Compare Post Acute Care list provided to:: Patient Choice offered to / list presented to : Patient  Discharge Placement                Patient to be transferred to facility by: Family car Name of family member notified: Self Patient and family notified of of transfer: 11/06/20  Discharge Plan and Services                                     Social Determinants of Health ([place]) Interventions     Readmission Risk Interventions Readmission Risk Prevention Plan 03/22/2020 09/26/2019 07/30/2018  Transportation Screening Complete Complete Complete  PCP or Specialist Appt within 3-5 Days - - Complete  HRI or Home Care Consult - - Not Complete  HRI or Home Care Consult comments - - NA  Social Work Consult for [place] Planning/Counseling - - Not Complete  SW consult not completed comments - - NA  Palliative Care Screening - - Not Applicable  Medication Review Press photographer) Complete - (No Data)  PCP or Specialist appointment within 3-5 days of discharge Complete Complete -  [place] or Home Care Consult Complete Complete -  SW Recovery Care/Counseling Consult Complete Complete -  Palliative Care  Screening Not Applicable Not Applicable -  [place] Not Applicable Not Applicable -  Some recent data might be hidden

## 2020-11-06 NOTE — Progress Notes (Signed)
Pt ordered to be discharged by provider. At this time pt is alert and oriented, no S/S of distress. V/S stable

## 2020-11-06 NOTE — Progress Notes (Signed)
ID Brief Note  Blood cultures no growth in 3 days.  No new recommendations   [name], MD Infectious Disease Physician [facility] for Infectious Disease [address] [place], [place] [address] Phone: [phone]  Fax: [phone]

## 2020-11-07 ENCOUNTER — Other Ambulatory Visit: Payer: Self-pay

## 2020-11-07 ENCOUNTER — Telehealth: Payer: Self-pay

## 2020-11-07 DIAGNOSIS — E1159 Type 2 diabetes mellitus with other circulatory complications: Secondary | ICD-10-CM

## 2020-11-07 DIAGNOSIS — N183 Chronic kidney disease, stage 3 unspecified: Secondary | ICD-10-CM

## 2020-11-07 DIAGNOSIS — E1142 Type 2 diabetes mellitus with diabetic polyneuropathy: Secondary | ICD-10-CM

## 2020-11-07 DIAGNOSIS — E1022 Type 1 diabetes mellitus with diabetic chronic kidney disease: Secondary | ICD-10-CM

## 2020-11-07 DIAGNOSIS — IMO0002 Reserved for concepts with insufficient information to code with codable children: Secondary | ICD-10-CM

## 2020-11-07 NOTE — Telephone Encounter (Signed)
Transition Care Management Follow-up Telephone Call Date of discharge and from where: 11/06/2020-[facility] ED How have you been since you were released from the hospital? Patient stated she is doing fine.  Any questions or concerns? No  Items Reviewed: Did the pt receive and understand the discharge instructions provided? Yes  Medications obtained and verified? Yes  Other? No  Any new allergies since your discharge? No  Dietary orders reviewed? N/A Do you have support at home? Yes   Home Care and Equipment/Supplies: Were home health services ordered? not applicable If so, what is the name of the agency? N/A  Has the agency set up a time to come to the patient's home? not applicable Were any new equipment or medical supplies ordered?  No What is the name of the medical supply agency? N/A Were you able to get the supplies/equipment? not applicable Do you have any questions related to the use of the equipment or supplies? No  Functional Questionnaire: (I = Independent and D = Dependent) ADLs: I  Bathing/Dressing- I  Meal Prep- I  Eating- I  Maintaining continence- I  Transferring/Ambulation- I  Managing Meds- I  Follow up appointments reviewed:  PCP Hospital f/u appt confirmed? Yes  Scheduled to see Alyssa Allwardt,PA-C on 11/07/2020 @ 8:30 am. [facility] f/u appt confirmed? Yes  Scheduled to see Dr. [name] on 12/11/2020 @ 2:00 pm. Are transportation arrangements needed? No  If their condition worsens, is the pt aware to call PCP or go to the Emergency Dept.? Yes Was the patient provided with contact information for the PCP's office or ED? Yes Was to pt encouraged to call back with questions or concerns? Yes

## 2020-11-07 NOTE — Patient Outreach (Signed)
[facility]) Care Management  9/43/2761  KADESIA ROBEL 4/70[birth year]295 [phone]   Managed Medicaid:  [employer]  Extreme high risk score for unplanned readmission  Referral:  Requesting follow up for high risk managed medicaid, Hemoglobin A1C 9.0, for diabetes management.  Natividad Brood, RN BSN [facility] Liaison  [phone] business mobile phone Toll free office [phone]  Fax number: [phone] [email or web address]

## 2020-11-08 ENCOUNTER — Encounter: Payer: Self-pay | Admitting: Physician Assistant

## 2020-11-08 ENCOUNTER — Other Ambulatory Visit: Payer: Self-pay

## 2020-11-08 ENCOUNTER — Ambulatory Visit (INDEPENDENT_AMBULATORY_CARE_PROVIDER_SITE_OTHER): Payer: Medicaid Other | Admitting: Physician Assistant

## 2020-11-08 VITALS — BP 147/85 | HR 88 | Temp 98.0°F | Ht 67.0 in

## 2020-11-08 DIAGNOSIS — I152 Hypertension secondary to endocrine disorders: Secondary | ICD-10-CM | POA: Diagnosis not present

## 2020-11-08 DIAGNOSIS — E1069 Type 1 diabetes mellitus with other specified complication: Secondary | ICD-10-CM | POA: Diagnosis not present

## 2020-11-08 DIAGNOSIS — E1159 Type 2 diabetes mellitus with other circulatory complications: Secondary | ICD-10-CM

## 2020-11-08 DIAGNOSIS — N183 Chronic kidney disease, stage 3 unspecified: Secondary | ICD-10-CM | POA: Diagnosis not present

## 2020-11-08 DIAGNOSIS — E1022 Type 1 diabetes mellitus with diabetic chronic kidney disease: Secondary | ICD-10-CM

## 2020-11-08 DIAGNOSIS — M869 Osteomyelitis, unspecified: Secondary | ICD-10-CM | POA: Diagnosis not present

## 2020-11-08 DIAGNOSIS — J690 Pneumonitis due to inhalation of food and vomit: Secondary | ICD-10-CM

## 2020-11-08 LAB — CULTURE, BLOOD (ROUTINE X 2)
Culture: NO GROWTH
Special Requests: ADEQUATE

## 2020-11-08 MED ORDER — AMLODIPINE BESYLATE 10 MG PO TABS: 10.0000 mg | ORAL_TABLET | Freq: Every day | ORAL | 0 refills | Status: DC

## 2020-11-08 MED ORDER — ALBUTEROL SULFATE 108 (90 BASE) MCG/ACT IN AEPB: 2.0000 | INHALATION_SPRAY | Freq: Every day | RESPIRATORY_TRACT | 2 refills | Status: AC | PRN

## 2020-11-08 MED ORDER — ONDANSETRON 4 MG PO TBDP: 4.0000 mg | ORAL_TABLET | Freq: Three times a day (TID) | ORAL | 0 refills | Status: AC | PRN

## 2020-11-08 NOTE — Patient Instructions (Signed)
Good to meet you today. Glad you are feeling better. Your medications were refilled as requested. Please keep follow up appointments as scheduled. Continue to monitor sugars and BP at home. Bring log to next appointment. Back to ED if any sudden, severe change in symptoms.

## 2020-11-08 NOTE — Progress Notes (Signed)
Established Patient Office Visit  Subjective:  Patient ID: [name], female    DOB: 18-Apr-1990  Age: 30 y.o. 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  She is here with her husband and her daughter.   Admitted 11/03/2020. Discharged on 11/06/2020 from [facility] ED s/p sepsis with multifactorial PNA and Type 1 DM hypoglycemic event.   -States that she feels kind of tired today, but overall doing better. -She has a PICC line in her right arm and she has been giving herself the antibiotics without issues.  -She has a home health nurse coming on 11/12/20 to check the PICC line and her wound.  -Blood sugar this morning was 164. -She was able to eat a blueberry danish this morning  She is scheduled to f/up with Dr. [name], endocrinologist on 12/11/2020, but she is calling to schedule sooner if possible.   Post-op f/up with [facility] next week per patient.   Denies any shortness of breath or chest pain. Some slight cough in the morning still. Hx of cyclical vomiting, but denies any vomit since discharge. No abdominal pain. No fevers or chills.   Pain is 7/10 currently around surgical site. Took her pain medication this morning, just says it takes awhile to kick in.   Past Medical History:  Diagnosis Date   Allergy    Anemia    Anxiety    Blood transfusion without reported diagnosis    Dec 2018   Cataract    right eye   COVID-19 03/2020   Depression    Diabetes type 1, uncontrolled ([place]) 11/14/2011   Since age 59   Fibromyalgia    Gastroparesis    GERD (gastroesophageal reflux disease)    Hypertension    Infection    UTI April 2016    Past Surgical History:   Procedure Laterality Date   AMPUTATION Right 04/13/2020   Procedure: 1ST RAY AMPUTATION RIGHT FOOT;  Surgeon: Newt Minion, MD;  Location: [place];  Service: Orthopedics;  Laterality: Right;   AMPUTATION Right 06/06/2020   Procedure: RIGHT BELOW KNEE AMPUTATION;  Surgeon: Newt Minion, MD;  Location: [place];  Service: Orthopedics;  Laterality: Right;   ANKLE SURGERY     APPLICATION OF WOUND VAC Right 06/06/2020   Procedure: APPLICATION OF WOUND VAC;  Surgeon: Newt Minion, MD;  Location: [facility];  Service: Orthopedics;  Laterality: Right;   CHOLECYSTECTOMY  11/15/2011   Procedure: LAPAROSCOPIC CHOLECYSTECTOMY WITH INTRAOPERATIVE CHOLANGIOGRAM;  Surgeon: [name], MD;  Location: [facility];  Service: General;  Laterality: N/A;   COLONOSCOPY     COLONOSCOPY WITH PROPOFOL N/A 06/27/2017   Procedure: COLONOSCOPY WITH PROPOFOL;  Surgeon: Milus Banister, MD;  [facility];  Service: Endoscopy;  Laterality: N/A;   ESOPHAGOGASTRODUODENOSCOPY  12/03/2011   Procedure: ESOPHAGOGASTRODUODENOSCOPY (EGD);  Surgeon: Beryle Beams, MD;  [facility];  Service: Endoscopy;  Laterality: N/A;   FLEXIBLE SIGMOIDOSCOPY N/A 03/10/2017   Procedure: FLEXIBLE SIGMOIDOSCOPY;  Surgeon: Carol Ada, MD;  [facility];  Service: Endoscopy;  Laterality: N/A;   INCISION AND DRAINAGE PERIRECTAL ABSCESS N/A 03/01/2017   Procedure: IRRIGATION AND DEBRIDEMENT PERIRECTAL ABSCESS;  Surgeon: Lucia Gaskins,  Shanon Brow, MD;  [facility];  Service: General;  Laterality: N/A;   IRRIGATION AND DEBRIDEMENT BUTTOCKS N/A 03/23/2017   Procedure: IRRIGATION AND DEBRIDEMENT BUTTOCKS, SETON PLACEMENT;  Surgeon: [name], MD;  [facility];  Service: General;  Laterality: N/A;   LAPAROSCOPY  11/23/2011   Procedure: LAPAROSCOPY DIAGNOSTIC;  Surgeon: Edward Jolly, MD;  [facility];  Service: General;  Laterality: N/A;   SIGMOIDOSCOPY     UPPER GASTROINTESTINAL ENDOSCOPY     WISDOM TOOTH EXTRACTION       Family History  Problem Relation Age of Onset   Diabetes Mother    Hypertension Father    Colon cancer Paternal Grandmother        pt thinks PGM was dx in her 44's   Diabetes Paternal Grandmother    Diabetes Maternal Grandmother    Diabetes Maternal Grandfather    Diabetes Paternal Grandfather    Diabetes Other    Breast cancer Paternal Aunt    Esophageal cancer Neg Hx    Liver cancer Neg Hx    Pancreatic cancer Neg Hx    Stomach cancer Neg Hx    Rectal cancer Neg Hx     Social History   Socioeconomic History   Marital status: Married    Spouse name: sergio   Number of children: 0   Years of education: college   Highest education level: Not on file  Occupational History   Occupation: Publishing rights manager call center    Employer: [employer]  Tobacco Use   Smoking status: Never   Smokeless tobacco: Never  Vaping Use   Vaping Use: Never used  Substance and Sexual Activity   Alcohol use: No    Alcohol/week: 0.0 standard drinks   Drug use: Yes    Types: Marijuana   Sexual activity: Not Currently    Partners: Male    Birth control/protection: None  Other Topics Concern   Not on file  Social History Narrative   Not on file   Social Determinants of Health   Financial Resource Strain: Not on file  Food Insecurity: Food Insecurity Present   Worried About [place] in the Last Year: Sometimes true   Ran Out of Food in the Last Year: Sometimes true  Transportation Needs: No Transportation Needs   Lack of Transportation (Medical): No   Lack of Transportation (Non-Medical): No  Physical Activity: Not on file  Stress: Not on file  Social Connections: Not on file  Intimate Partner Violence: Not on file    Outpatient Medications Prior to Visit  Medication Sig Dispense Refill   busPIRone (BUSPAR) 5 MG tablet Take 1 tablet (5 mg total) by mouth 3 (three) times daily. 90 tablet 5   Carboxymethylcell-Hypromellose (GENTEAL) 0.25-0.3 % GEL Place 1 drop into  both eyes daily as needed (dryeyes).     carvedilol (COREG) 12.5 MG tablet Take 1 tablet (12.5 mg total) by mouth 2 (two) times daily with a meal. 60 tablet 2   [START ON 11/11/2020] ceFAZolin (ANCEF) IVPB Inject 2 g into the vein every 8 (eight) hours for 20 days. Indication:  Wound infection  First Dose: Yes Last Day of Therapy:  12/01/20 Labs - Once weekly:  CBC/D and BMP, Labs - Every other week:  ESR and CRP Method of administration: IV Push Method of administration may be changed at the discretion of home infusion pharmacist based upon assessment of the patient and/or caregiver's ability to self-administer the medication ordered. 60 Units 0  Continuous Blood Gluc Sensor (DEXCOM G6 SENSOR) MISC USE AS DIRECTED EVERY 10 DAYS 3 each 1   Continuous Blood Gluc Transmit (DEXCOM G6 TRANSMITTER) MISC USE AS DIRECTED TO  MONITOR  GLUCOSE  LEVELS 1 each 0   cyclobenzaprine (FLEXERIL) 10 MG tablet TAKE 1 TABLET(10 MG) BY MOUTH THREE TIMES DAILY AS NEEDED FOR MUSCLE SPASMS (Patient taking differently: Take 10 mg by mouth 3 (three) times daily as needed for muscle spasms.) 30 tablet 0   dicyclomine (BENTYL) 20 MG tablet Take 1 tablet (20 mg total) by mouth every 8 (eight) hours as needed for spasms. 15 tablet 0   EPINEPHrine 0.3 mg/0.3 mL IJ SOAJ injection Inject 0.3 mg into the muscle once as needed (anaphylaxis/allergic reaction). (Patient taking differently: Inject 0.3 mg into the muscle once as needed for anaphylaxis.) 2 each 0   furosemide (LASIX) 40 MG tablet Take 1 tablet (40 mg total) by mouth daily. 90 tablet 1   gabapentin (NEURONTIN) 100 MG capsule Take 200 mg by mouth 3 (three) times daily.     hydrALAZINE (APRESOLINE) 50 MG tablet Take 50 mg by mouth 2 (two) times daily.     insulin glargine (LANTUS) 100 UNIT/ML injection Inject 0.08 mLs (8 Units total) into the skin daily. 10 mL 11   insulin lispro (HUMALOG) 100 UNIT/ML injection Inject 0-5 Units into the skin 3 (three) times daily before  meals. Sliding scale     Insulin Pen Needle 32G X 4 MM MISC USE AS DIRECTED WITH INSULIN PENS 100 each 0   loperamide (IMODIUM) 2 MG capsule Take 6 mg by mouth daily as needed for diarrhea or loose stools.      LORazepam (ATIVAN) 0.5 MG tablet TAKE 1 TABLET(0.5 MG) BY MOUTH TWICE DAILY AS NEEDED FOR ANXIETY (Patient taking differently: Take 0.5 mg by mouth 2 (two) times daily.) 60 tablet 5   metoCLOPramide (REGLAN) 5 MG tablet Take 1 tablet (5 mg total) by mouth in the morning and at bedtime. Please make and keep an appointment for further refills. (Patient taking differently: Take 5 mg by mouth in the morning and at bedtime.) 60 tablet 0   mirtazapine (REMERON) 30 MG tablet TAKE 1 TABLET BY MOUTH AT  BEDTIME (Patient taking differently: Take 30 mg by mouth at bedtime.) 30 tablet 11   oxyCODONE-acetaminophen (PERCOCET) 10-325 MG tablet Take 1 tablet by mouth every 4 (four) hours as needed for pain.     pantoprazole (PROTONIX) 40 MG tablet Take 1 tablet (40 mg total) by mouth 2 (two) times daily. 180 tablet 2   Albuterol Sulfate (PROAIR RESPICLICK) 108 (90 Base) MCG/ACT AEPB Inhale 2 puffs into the lungs daily as needed.     amLODipine (NORVASC) 10 MG tablet Take 1 tablet (10 mg total) by mouth daily. 30 tablet 0   ondansetron (ZOFRAN ODT) 4 MG disintegrating tablet Take 1 tablet (4 mg total) by mouth every 8 (eight) hours as needed for nausea or vomiting. 20 tablet 0   ceFEPime (MAXIPIME) IVPB Inject 2 g into the vein every 12 (twelve) hours for 5 days. Indication:  Pneumonia First Dose: Yes Last Day of Therapy:  11/10/20 Labs - Once weekly:  CBC/D and BMP, Labs - Every other week:  ESR and CRP Method of administration: IV Push Method of administration may be changed at the discretion of home infusion pharmacist based upon assessment of the patient and/or caregiver's ability to self-administer the medication ordered. (Patient not taking: Reported on 11/08/2020) 10 Units 0     furosemide (LASIX) 40 MG  tablet TAKE 1 TABLET (40 MG TOTAL) BY MOUTH DAILY. (Patient taking differently: Take 40 mg by mouth daily.) 90 tablet 1   oxyCODONE (ROXICODONE) 5 MG immediate release tablet Take 1 tablet (5 mg total) by mouth 3 (three) times daily as needed for severe pain. 21 tablet 0   No facility-administered medications prior to visit.    Allergies  Allergen Reactions   Other Anaphylaxis    Reaction to [place] nuts    Lactose Intolerance (Gi) Diarrhea    ROS Review of Systems REFER TO HPI FOR PERTINENT POSITIVES AND NEGATIVES    Objective:    Physical Exam Vitals and nursing note reviewed.  Constitutional:      General: She is not in acute distress.    Appearance: Normal appearance. She is normal weight.  HENT:     Head: Normocephalic.     Right Ear: External ear normal.     Left Ear: External ear normal.     Nose: Nose normal.     Mouth/Throat:     Mouth: Mucous membranes are moist.  Eyes:     Extraocular Movements: Extraocular movements intact.     Conjunctiva/sclera: Conjunctivae normal.     Pupils: Pupils are equal, round, and reactive to light.  Cardiovascular:     Rate and Rhythm: Normal rate and regular rhythm.     Pulses: Normal pulses.     Heart sounds: No murmur heard. Pulmonary:     Effort: Pulmonary effort is normal.     Breath sounds: Normal breath sounds.  Abdominal:     Tenderness: There is no abdominal tenderness.  Musculoskeletal:        General: Normal range of motion.     Cervical back: Normal range of motion.     Comments: RIGHT BKA LEFT FOOT IN POST-OP SHOE 2/2 LEFT HALLUX AMPUTATION  Skin:    General: Skin is warm.  Neurological:     General: No focal deficit present.     Mental Status: She is alert and oriented to person, place, and time.     Gait: Gait normal.  Psychiatric:        Mood and Affect: Mood normal.        Behavior: Behavior normal.    BP (!) 147/85   Pulse 88   Temp 98 F (36.7 C)   Ht 5' 7" (1.702 m)   SpO2 97%   BMI 23.49  kg/m  Wt Readings from Last 3 Encounters:  11/03/20 150 lb (68 kg)  09/25/20 140 lb (63.5 kg)  06/28/20 165 lb (74.8 kg)     Health Maintenance Due  Topic Date Due   OPHTHALMOLOGY EXAM  Never done   Hepatitis C Screening  Never done   PAP SMEAR-Modifier  08/09/2017   COVID-19 Vaccine (3 - Booster for Pfizer series) 12/28/2019   INFLUENZA VACCINE  10/29/2020    There are no preventive care reminders to display for this patient.  Lab Results  Component Value Date   TSH 1.472 11/03/2020   Lab Results  Component Value Date   WBC 11.3 (H) 11/05/2020   HGB 7.5 (L) 11/05/2020   HCT 23.7 (L) 11/05/2020   MCV 94.0 11/05/2020   PLT 248 11/05/2020   Lab Results  Component Value Date   NA 135 11/05/2020   K 4.2 11/05/2020   CO2 20 (L) 11/05/2020   GLUCOSE 164 (H) 11/05/2020   BUN 21 (H) 11/05/2020   CREATININE 1.13 (H) 11/05/2020  BILITOT 0.5 11/03/2020   ALKPHOS 76 11/03/2020   AST 31 11/03/2020   ALT 26 11/03/2020   PROT 6.5 11/03/2020   ALBUMIN 1.7 (L) 11/03/2020   CALCIUM 7.9 (L) 11/05/2020   ANIONGAP 5 11/05/2020   GFR 32.62 (L) 06/28/2020   Lab Results  Component Value Date   CHOL 133 01/26/2009   Lab Results  Component Value Date   HDL 57 01/26/2009   Lab Results  Component Value Date   LDLCALC 62 01/26/2009   Lab Results  Component Value Date   TRIG 570 (H) 05/17/2020   Lab Results  Component Value Date   CHOLHDL 4.5 09/01/2008   Lab Results  Component Value Date   HGBA1C 6.9 (H) 06/04/2020      Assessment & Plan:   Problem List Items Addressed This Visit       Cardiovascular and Mediastinum   Hypertension associated with diabetes (HCC) (Chronic)   Relevant Medications   amLODipine (NORVASC) 10 MG tablet     Endocrine   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 (HCC)   Other Visit Diagnoses     Type 1 diabetes mellitus with other specified complication (HCC)    -  Primary   Aspiration pneumonia, unspecified aspiration  pneumonia type, unspecified laterality, unspecified part of lung (HCC)       Relevant Medications   Albuterol Sulfate (PROAIR RESPICLICK) 108 (90 Base) MCG/ACT AEPB   Toe osteomyelitis, left (HCC)           Meds ordered this encounter  Medications   Albuterol Sulfate (PROAIR RESPICLICK) 108 (90 Base) MCG/ACT AEPB    Sig: Inhale 2 puffs into the lungs daily as needed.    Dispense:  1 each    Refill:  2   amLODipine (NORVASC) 10 MG tablet    Sig: Take 1 tablet (10 mg total) by mouth daily.    Dispense:  30 tablet    Refill:  0   ondansetron (ZOFRAN ODT) 4 MG disintegrating tablet    Sig: Take 1 tablet (4 mg total) by mouth every 8 (eight) hours as needed for nausea or vomiting.    Dispense:  20 tablet    Refill:  0    Follow-up: No follow-ups on file.   PLAN: -Medications reconciled at today's visit -Her husband is able to continue helping with her care at home -She is calling Dr. Ellison's office to schedule sooner f/up -F/up with PCP Dr. Parker is also scheduled for next week -Finish antibiotic regimen cefepime & then cefazolin for osteomyelitis and PNA -F/up with [facility] infectious disease as already scheduled -Back to ED if any fevers, confusion, weakness, severe pain, or other acute changes in symptoms    M , PA-C 

## 2020-11-11 DIAGNOSIS — E10628 Type 1 diabetes mellitus with other skin complications: Secondary | ICD-10-CM | POA: Diagnosis not present

## 2020-11-11 DIAGNOSIS — I96 Gangrene, not elsewhere classified: Secondary | ICD-10-CM | POA: Diagnosis not present

## 2020-11-11 DIAGNOSIS — S91302A Unspecified open wound, left foot, initial encounter: Secondary | ICD-10-CM | POA: Diagnosis not present

## 2020-11-11 DIAGNOSIS — M86171 Other acute osteomyelitis, right ankle and foot: Secondary | ICD-10-CM | POA: Diagnosis not present

## 2020-11-11 DIAGNOSIS — J80 Acute respiratory distress syndrome: Secondary | ICD-10-CM | POA: Diagnosis not present

## 2020-11-11 DIAGNOSIS — L02611 Cutaneous abscess of right foot: Secondary | ICD-10-CM | POA: Diagnosis not present

## 2020-11-11 DIAGNOSIS — M86272 Subacute osteomyelitis, left ankle and foot: Secondary | ICD-10-CM | POA: Diagnosis not present

## 2020-11-12 ENCOUNTER — Encounter (HOSPITAL_COMMUNITY): Payer: Self-pay

## 2020-11-12 ENCOUNTER — Emergency Department (HOSPITAL_COMMUNITY): Payer: Medicaid Other

## 2020-11-12 ENCOUNTER — Observation Stay (HOSPITAL_COMMUNITY)
Admission: EM | Admit: 2020-11-12 | Discharge: 2020-11-13 | Disposition: A | Discharge status: Home / Self Care | Payer: Medicaid Other | Attending: Internal Medicine | Admitting: Internal Medicine

## 2020-11-12 ENCOUNTER — Other Ambulatory Visit: Payer: Self-pay

## 2020-11-12 DIAGNOSIS — R1013 Epigastric pain: Secondary | ICD-10-CM | POA: Diagnosis present

## 2020-11-12 DIAGNOSIS — N1832 Chronic kidney disease, stage 3b: Secondary | ICD-10-CM | POA: Insufficient documentation

## 2020-11-12 DIAGNOSIS — R1110 Vomiting, unspecified: Secondary | ICD-10-CM

## 2020-11-12 DIAGNOSIS — R112 Nausea with vomiting, unspecified: Secondary | ICD-10-CM | POA: Diagnosis not present

## 2020-11-12 DIAGNOSIS — E1143 Type 2 diabetes mellitus with diabetic autonomic (poly)neuropathy: Secondary | ICD-10-CM | POA: Diagnosis not present

## 2020-11-12 DIAGNOSIS — R109 Unspecified abdominal pain: Secondary | ICD-10-CM | POA: Diagnosis not present

## 2020-11-12 DIAGNOSIS — Z9049 Acquired absence of other specified parts of digestive tract: Secondary | ICD-10-CM | POA: Diagnosis not present

## 2020-11-12 DIAGNOSIS — Z20822 Contact with and (suspected) exposure to covid-19: Secondary | ICD-10-CM | POA: Diagnosis not present

## 2020-11-12 DIAGNOSIS — J189 Pneumonia, unspecified organism: Secondary | ICD-10-CM | POA: Diagnosis not present

## 2020-11-12 DIAGNOSIS — K3184 Gastroparesis: Principal | ICD-10-CM | POA: Insufficient documentation

## 2020-11-12 DIAGNOSIS — Z79899 Other long term (current) drug therapy: Secondary | ICD-10-CM | POA: Insufficient documentation

## 2020-11-12 DIAGNOSIS — Z794 Long term (current) use of insulin: Secondary | ICD-10-CM | POA: Insufficient documentation

## 2020-11-12 DIAGNOSIS — I129 Hypertensive chronic kidney disease with stage 1 through stage 4 chronic kidney disease, or unspecified chronic kidney disease: Secondary | ICD-10-CM | POA: Diagnosis not present

## 2020-11-12 DIAGNOSIS — E1022 Type 1 diabetes mellitus with diabetic chronic kidney disease: Secondary | ICD-10-CM | POA: Insufficient documentation

## 2020-11-12 DIAGNOSIS — N3289 Other specified disorders of bladder: Secondary | ICD-10-CM | POA: Diagnosis not present

## 2020-11-12 DIAGNOSIS — Z8616 Personal history of COVID-19: Secondary | ICD-10-CM | POA: Diagnosis not present

## 2020-11-12 DIAGNOSIS — R188 Other ascites: Secondary | ICD-10-CM | POA: Diagnosis not present

## 2020-11-12 DIAGNOSIS — I10 Essential (primary) hypertension: Secondary | ICD-10-CM

## 2020-11-12 LAB — COMPREHENSIVE METABOLIC PANEL
ALT: 8 U/L (ref 0–44)
ALT: 31 U/L (ref 0–44)
AST: 42 U/L — ABNORMAL HIGH (ref 15–41)
AST: 26 U/L (ref 15–41)
Albumin: 5.1 g/dL — ABNORMAL HIGH (ref 3.5–5.0)
Albumin: 2 g/dL — ABNORMAL LOW (ref 3.5–5.0)
Alkaline Phosphatase: 45 U/L (ref 38–126)
Alkaline Phosphatase: 83 U/L (ref 38–126)
Anion gap: 9 (ref 5–15)
Anion gap: 8 (ref 5–15)
BUN: 18 mg/dL (ref 6–20)
BUN: 25 mg/dL — ABNORMAL HIGH (ref 6–20)
CO2: 24 mmol/L (ref 22–32)
CO2: 24 mmol/L (ref 22–32)
Calcium: 10.2 mg/dL (ref 8.9–10.3)
Calcium: 8.2 mg/dL — ABNORMAL LOW (ref 8.9–10.3)
Chloride: 103 mmol/L (ref 98–111)
Chloride: 105 mmol/L (ref 98–111)
Creatinine, Ser: 1.33 mg/dL — ABNORMAL HIGH (ref 0.44–1.00)
Creatinine, Ser: 1.11 mg/dL — ABNORMAL HIGH (ref 0.44–1.00)
GFR, Estimated: 55 mL/min — ABNORMAL LOW (ref >60)
GFR, Estimated: >60 mL/min (ref >60)
Glucose, Bld: 116 mg/dL — ABNORMAL HIGH (ref 70–99)
Glucose, Bld: 269 mg/dL — ABNORMAL HIGH (ref 70–99)
Potassium: 6.6 mmol/L (ref 3.5–5.1)
Potassium: 3.4 mmol/L — ABNORMAL LOW (ref 3.5–5.1)
Sodium: 136 mmol/L (ref 135–145)
Sodium: 137 mmol/L (ref 135–145)
Total Bilirubin: 1.4 mg/dL — ABNORMAL HIGH (ref 0.3–1.2)
Total Bilirubin: 0.3 mg/dL (ref 0.3–1.2)
Total Protein: 8 g/dL (ref 6.5–8.1)
Total Protein: 7.2 g/dL (ref 6.5–8.1)

## 2020-11-12 LAB — URINALYSIS, ROUTINE W REFLEX MICROSCOPIC
Bacteria, UA: NONE SEEN
Bilirubin Urine: NEGATIVE
Glucose, UA: 50 mg/dL — AB
Ketones, ur: NEGATIVE mg/dL
Leukocytes,Ua: NEGATIVE
Nitrite: NEGATIVE
Protein, ur: >=300 mg/dL — AB
Specific Gravity, Urine: 1.016 (ref 1.005–1.030)
pH: 6 (ref 5.0–8.0)

## 2020-11-12 LAB — LIPASE, BLOOD: Lipase: 21 U/L (ref 11–51)

## 2020-11-12 LAB — CBC WITH DIFFERENTIAL/PLATELET
Abs Immature Granulocytes: 0.03 10*3/uL (ref 0.00–0.07)
Basophils Absolute: 0.1 10*3/uL (ref 0.0–0.1)
Basophils Relative: 1 %
Eosinophils Absolute: 0.1 10*3/uL (ref 0.0–0.5)
Eosinophils Relative: 1 %
HCT: 27 % — ABNORMAL LOW (ref 36.0–46.0)
Hemoglobin: 8.8 g/dL — ABNORMAL LOW (ref 12.0–15.0)
Immature Granulocytes: 0 %
Lymphocytes Relative: 14 %
Lymphs Abs: 1.1 10*3/uL (ref 0.7–4.0)
MCH: 30.2 pg (ref 26.0–34.0)
MCHC: 32.6 g/dL (ref 30.0–36.0)
MCV: 92.8 fL (ref 80.0–100.0)
Monocytes Absolute: 0.5 10*3/uL (ref 0.1–1.0)
Monocytes Relative: 6 %
Neutro Abs: 6.3 10*3/uL (ref 1.7–7.7)
Neutrophils Relative %: 78 %
Platelets: 208 10*3/uL (ref 150–400)
RBC: 2.91 MIL/uL — ABNORMAL LOW (ref 3.87–5.11)
RDW: 14 % (ref 11.5–15.5)
WBC: 8.1 10*3/uL (ref 4.0–10.5)
nRBC: 0 % (ref 0.0–0.2)

## 2020-11-12 LAB — LACTIC ACID, PLASMA: Lactic Acid, Venous: 0.8 mmol/L (ref 0.5–1.9)

## 2020-11-12 LAB — RESP PANEL BY RT-PCR (FLU A&B, COVID) ARPGX2
Influenza A by PCR: NEGATIVE
Influenza B by PCR: NEGATIVE
SARS Coronavirus 2 by RT PCR: NEGATIVE

## 2020-11-12 LAB — POTASSIUM: Potassium: 3.1 mmol/L — ABNORMAL LOW (ref 3.5–5.1)

## 2020-11-12 LAB — CBG MONITORING, ED: Glucose-Capillary: 134 mg/dL — ABNORMAL HIGH (ref 70–99)

## 2020-11-12 LAB — I-STAT BETA HCG BLOOD, ED (MC, WL, AP ONLY): I-stat hCG, quantitative: <5 m[IU]/mL (ref <5)

## 2020-11-12 LAB — GLUCOSE, CAPILLARY: Glucose-Capillary: 204 mg/dL — ABNORMAL HIGH (ref 70–99)

## 2020-11-12 LAB — BETA-HYDROXYBUTYRIC ACID: Beta-Hydroxybutyric Acid: 1.24 mmol/L — ABNORMAL HIGH (ref 0.05–0.27)

## 2020-11-12 LAB — MAGNESIUM: Magnesium: 1.9 mg/dL (ref 1.7–2.4)

## 2020-11-12 IMAGING — CR DG CHEST 2V
2 series · 2 of 2 positions shown · non-contrast
Comparison: [DATE]

CLINICAL DATA: Abdominal/back pain

EXAM:
CHEST - 2 VIEW

[w chest pa]
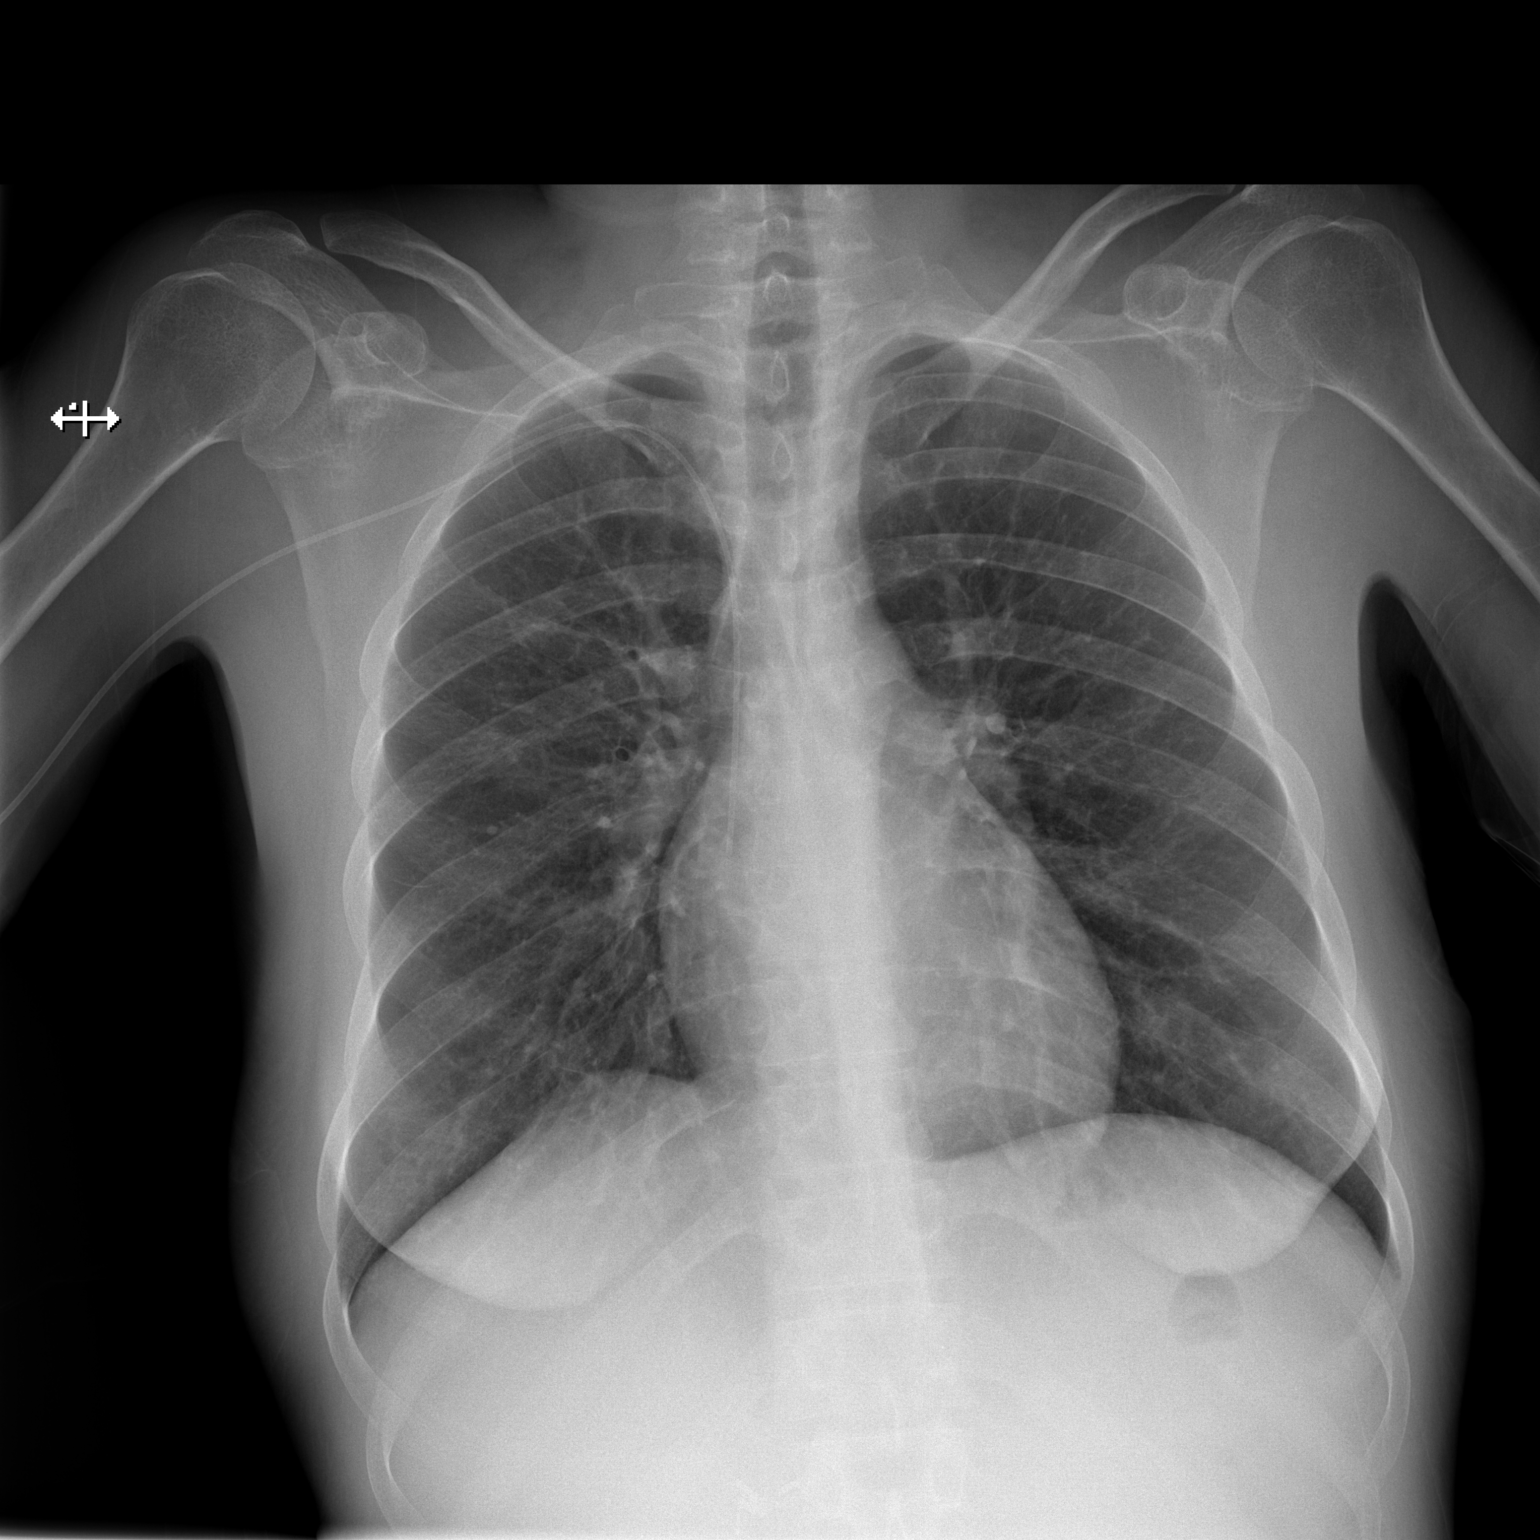

[w chest lat]
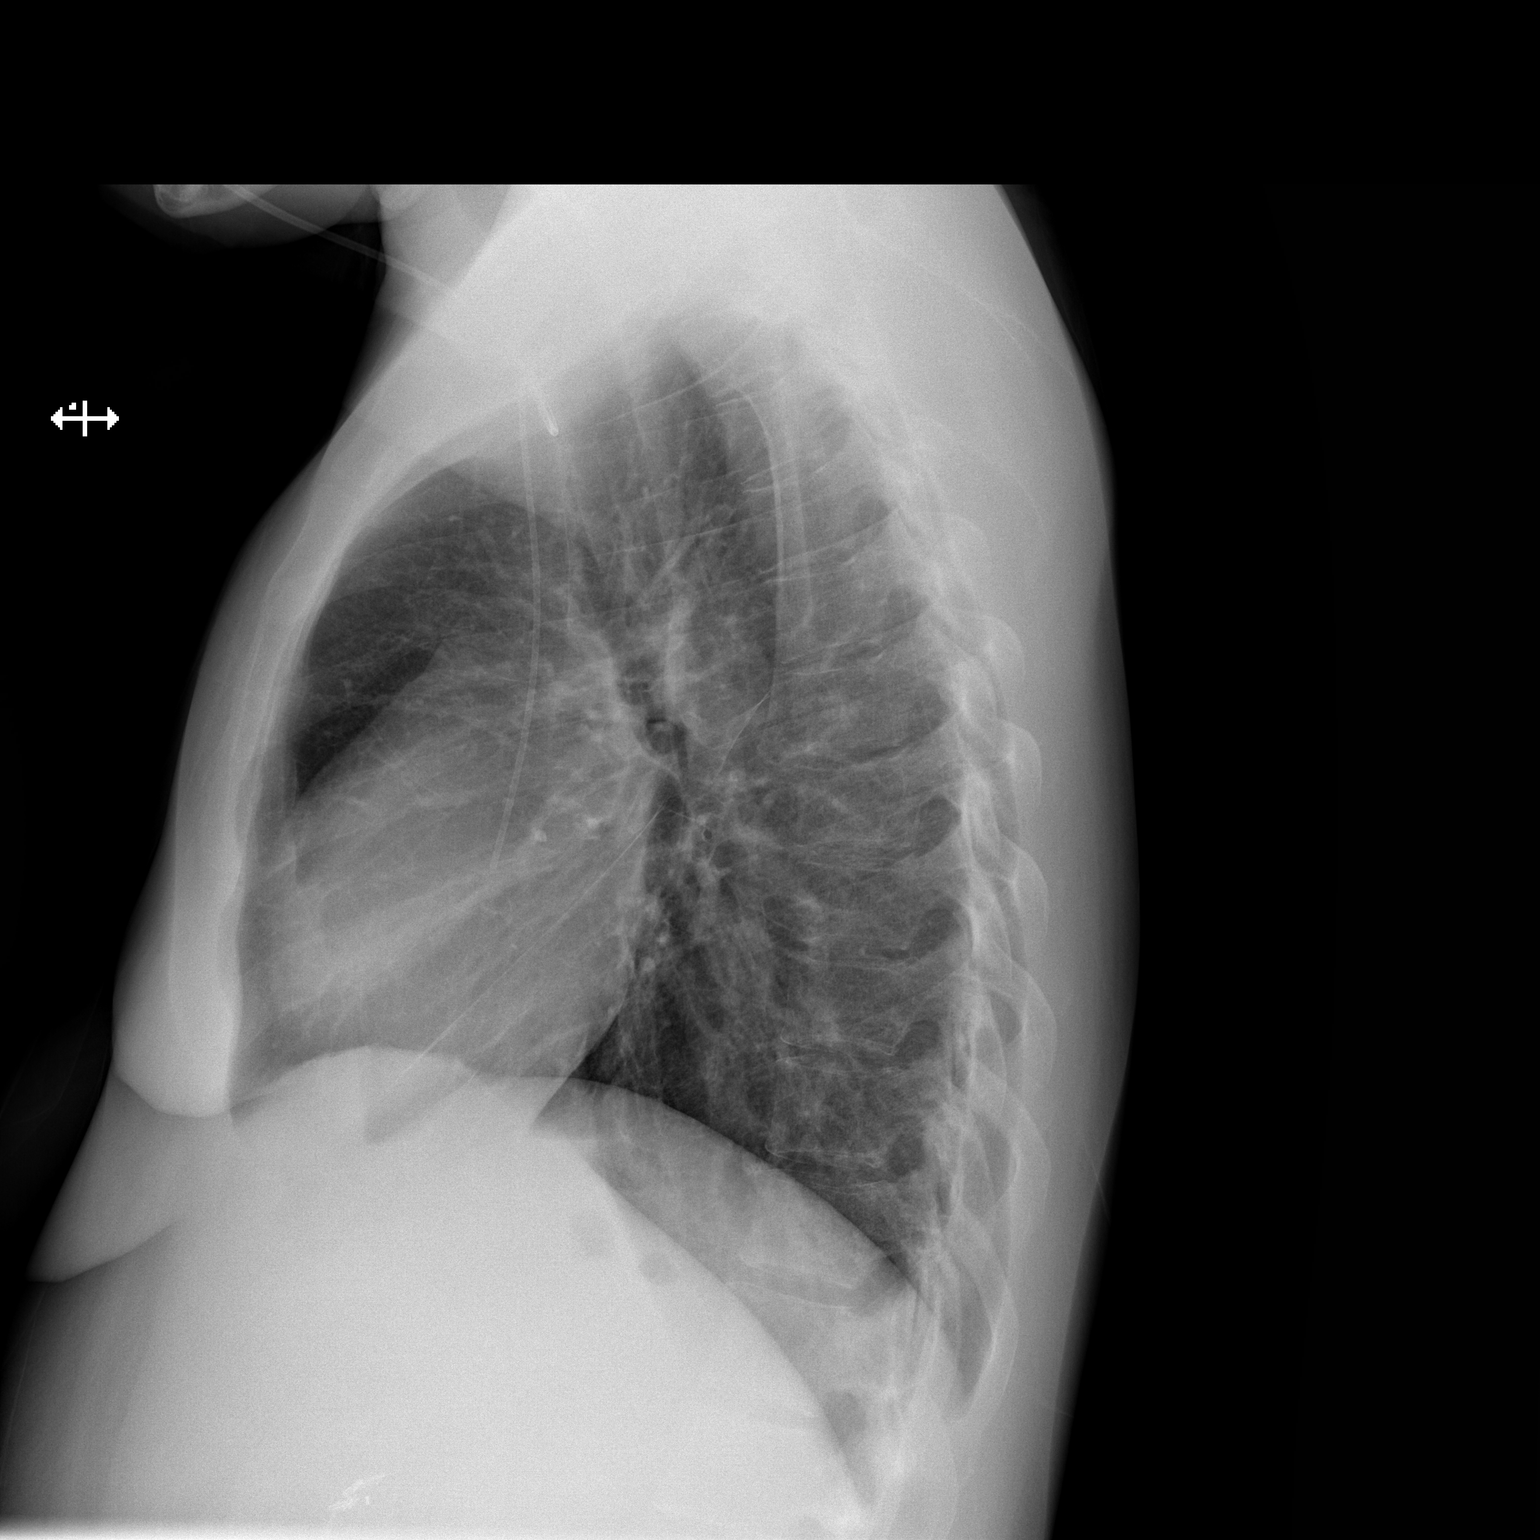

[2 of 2 positions shown; findings below may reference images not displayed]

FINDINGS: Prior right lung pneumonia has resolved. Left lung is clear. No
pleural effusion or pneumothorax.

The heart is normal in size.

Right arm PICC terminates at the cavoatrial junction.
IMPRESSION: Prior right lung pneumonia has resolved.

No evidence of acute cardiopulmonary disease.

## 2020-11-12 IMAGING — CT CT RENAL STONE PROTOCOL
2 of 4 series · 16 of 46 positions shown, 18 images · non-contrast
Comparison: CT abdomen pelvis dated [DATE].

CLINICAL DATA: Abdominal pain.

EXAM:
CT ABDOMEN AND PELVIS WITHOUT CONTRAST
TECHNIQUE: Multidetector CT imaging of the abdomen and pelvis was performed
following the standard protocol without IV contrast.

[Series 2: axial st · axial · 0.72mm/px · z∈[-487,-62]mm · 13 of 97 slices shown, 15 images]
[im 6/97  soft-tissue]
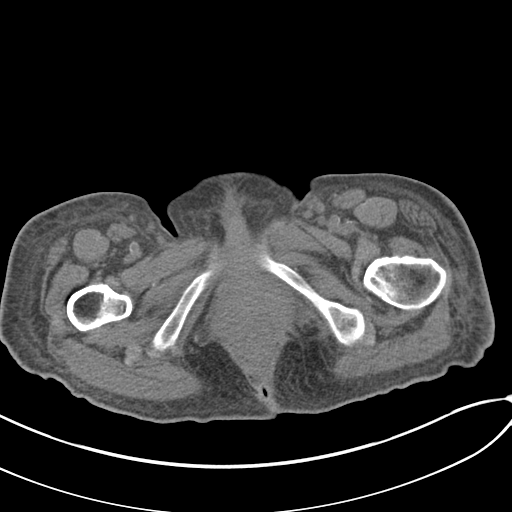
[im 6/97  bone]
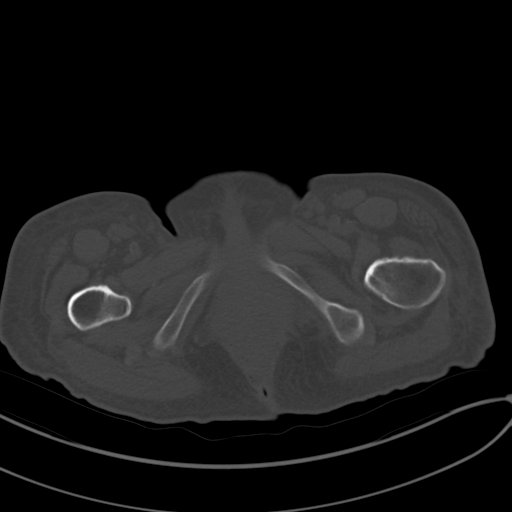
[im 16/97  soft-tissue]
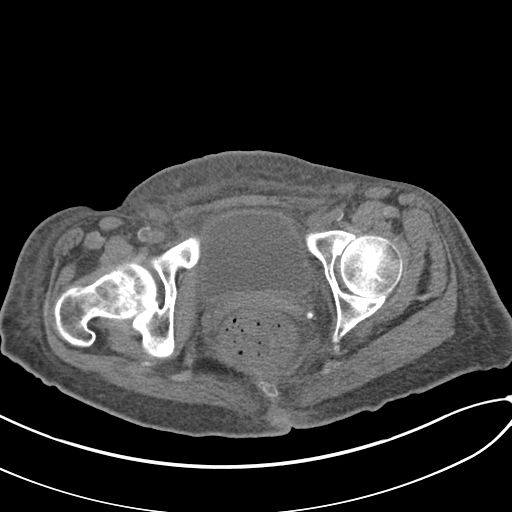
[im 21/97  soft-tissue]
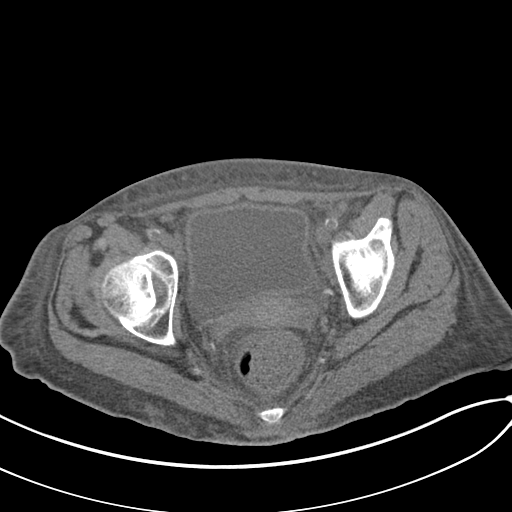
[im 26/97  soft-tissue]
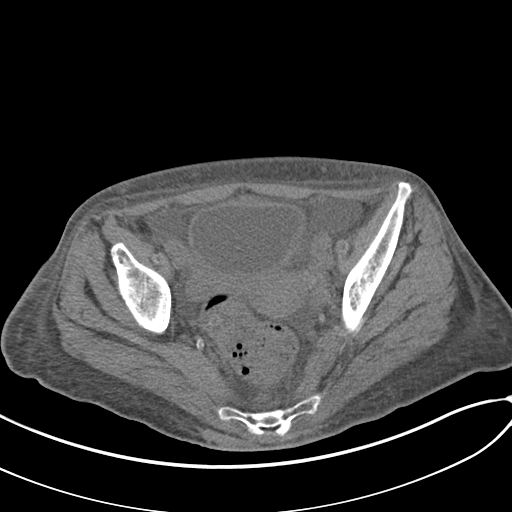
[im 36/97  soft-tissue]
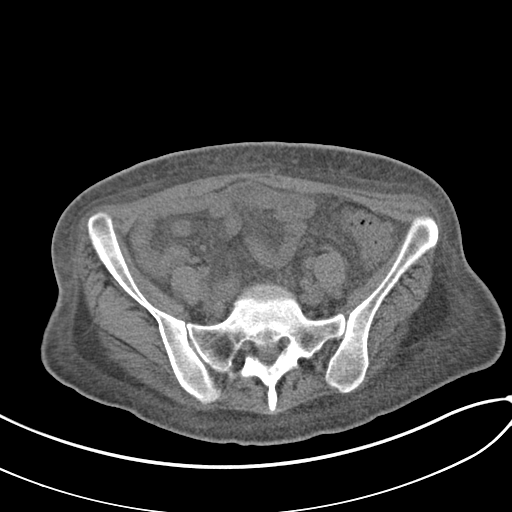
[im 41/97  soft-tissue]
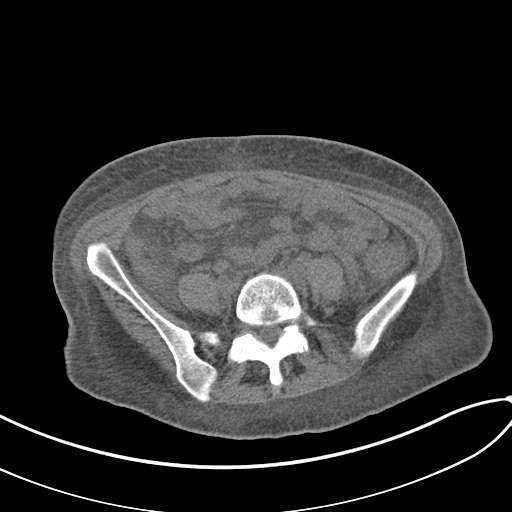
[im 51/97  soft-tissue]
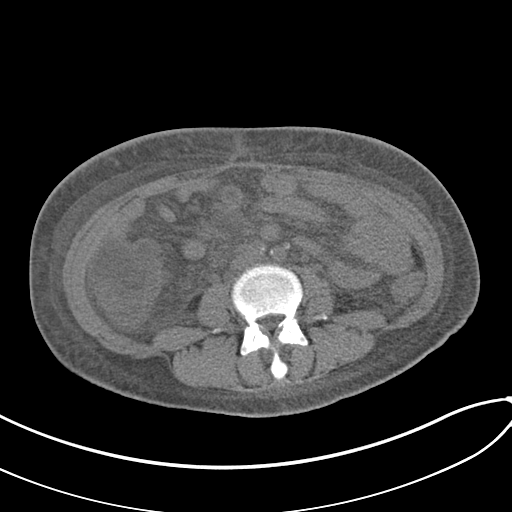
[im 56/97  soft-tissue]
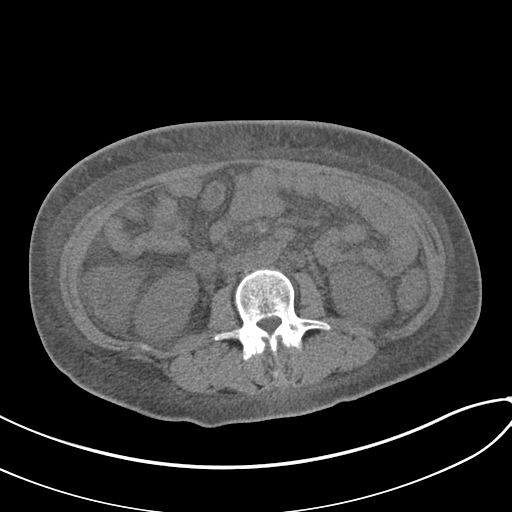
[im 61/97  soft-tissue]
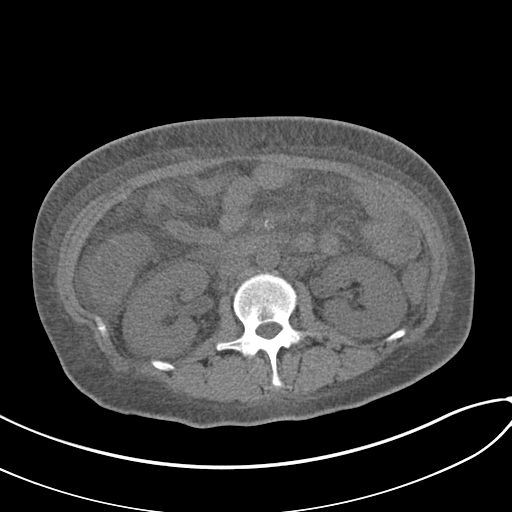
[im 61/97  bone]
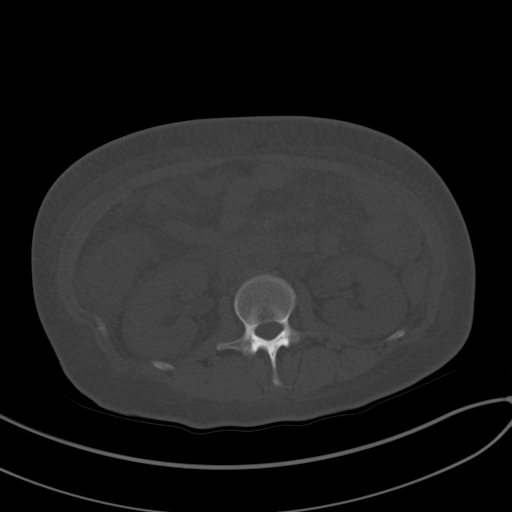
[im 71/97  soft-tissue]
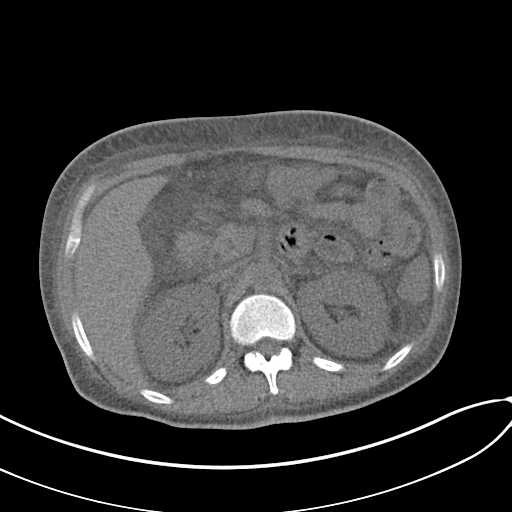
[im 76/97  soft-tissue]
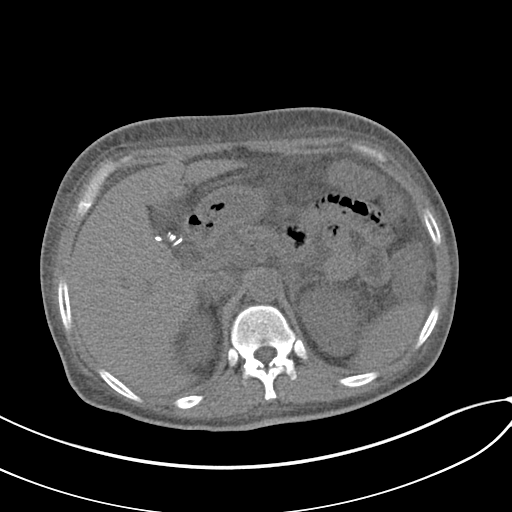
[im 81/97  soft-tissue]
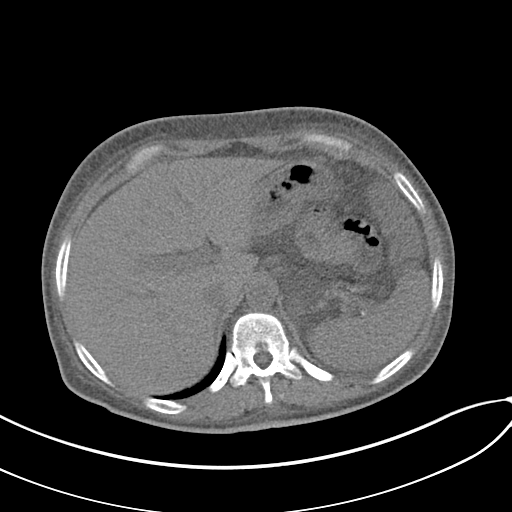
[im 91/97  soft-tissue]
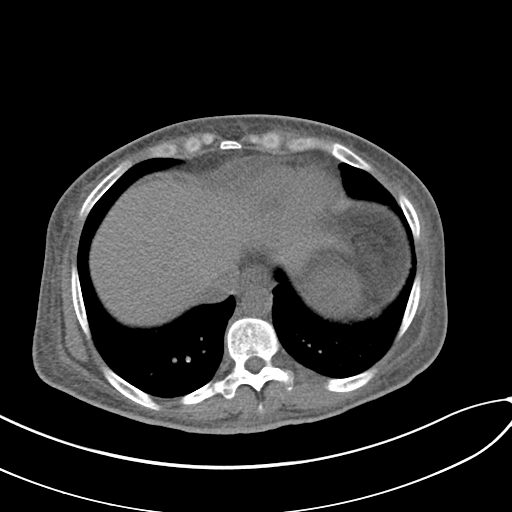

[Series 5: coronal · coronal · 0.75mm/px · 3 of 136 slices shown]
[im 46/136  soft-tissue]
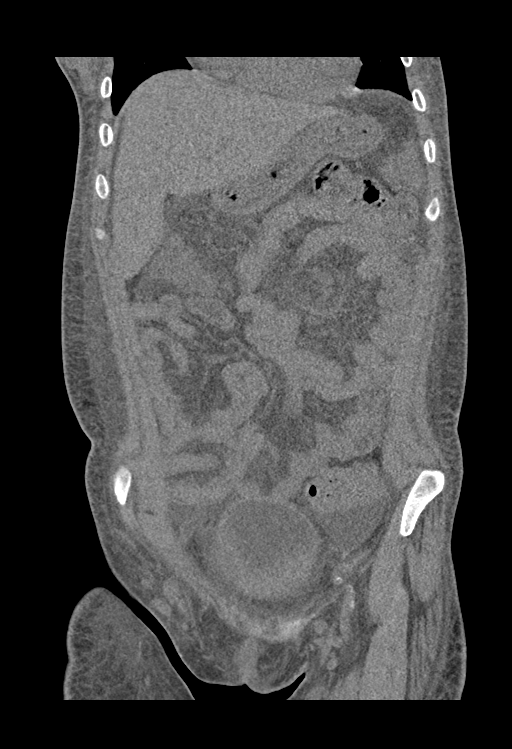
[im 61/136  soft-tissue]
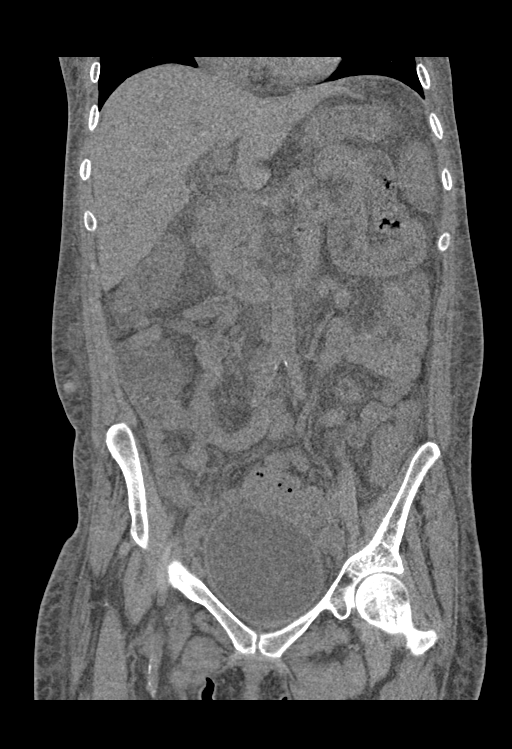
[im 76/136  soft-tissue]
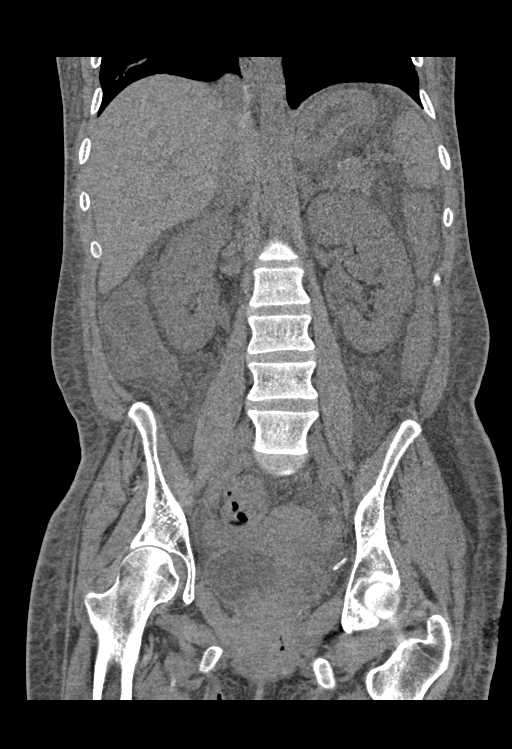

[16 of 46 positions shown; findings below may reference images not displayed]

FINDINGS: Lower chest: No acute abnormality.

Hepatobiliary: No focal liver abnormality is seen. Status post
cholecystectomy. No biliary dilatation.

Pancreas: Unremarkable. No pancreatic ductal dilatation or
surrounding inflammatory changes.

Spleen: Normal in size without focal abnormality.

Adrenals/Urinary Tract: Adrenal glands are unremarkable. Kidneys are
normal, without renal calculi, focal lesion, or hydronephrosis.
Unchanged mild circumferential bladder wall thickening.

Stomach/Bowel: Stomach is within normal limits. Appendix appears
normal. No evidence of bowel wall thickening, distention, or
inflammatory changes.

Vascular/Lymphatic: No significant vascular findings are present. No
enlarged abdominal or pelvic lymph nodes.

Reproductive: Uterus and bilateral adnexa are unremarkable.

Other: Unchanged small ascites.  No pneumoperitoneum.

Musculoskeletal: No acute or significant osseous findings. New
cm intermediate density in the right lateral abdominal may be
injection related. Unchanged diffuse anasarca.
IMPRESSION: 1. No acute intra-abdominal process.
2. Unchanged small ascites and diffuse anasarca.

## 2020-11-12 MED ORDER — OXYCODONE-ACETAMINOPHEN 10-325 MG PO TABS: 1.0000 | ORAL_TABLET | ORAL | Status: DC | PRN

## 2020-11-12 MED ORDER — ALBUTEROL SULFATE (2.5 MG/3ML) 0.083% IN NEBU: 2.5000 mg | INHALATION_SOLUTION | Freq: Every day | RESPIRATORY_TRACT | Status: DC | PRN

## 2020-11-12 MED ORDER — INSULIN GLARGINE-YFGN 100 UNIT/ML ~~LOC~~ SOLN
8.0000 [IU] | Freq: Every day | SUBCUTANEOUS | Status: DC
Start: 2020-11-12 — End: 2020-11-13
  Administered 2020-11-12: 8 [IU] via SUBCUTANEOUS
  Filled 2020-11-12 (×2): qty 0.08

## 2020-11-12 MED ORDER — GABAPENTIN 100 MG PO CAPS
200.0000 mg | ORAL_CAPSULE | Freq: Three times a day (TID) | ORAL | Status: DC
  Administered 2020-11-12 – 2020-11-13 (×2): 200 mg via ORAL
  Filled 2020-11-12 (×2): qty 2

## 2020-11-12 MED ORDER — LABETALOL HCL 5 MG/ML IV SOLN
10.0000 mg | Freq: Four times a day (QID) | INTRAVENOUS | Status: DC | PRN
  Administered 2020-11-12: 10 mg via INTRAVENOUS
  Filled 2020-11-12 (×2): qty 4

## 2020-11-12 MED ORDER — OXYCODONE HCL 5 MG PO TABS
5.0000 mg | ORAL_TABLET | Freq: Once | ORAL | Status: AC
Start: 2020-11-12 — End: 2020-11-12
  Administered 2020-11-12: 5 mg via ORAL
  Filled 2020-11-12: qty 1

## 2020-11-12 MED ORDER — HYDRALAZINE HCL 25 MG PO TABS
50.0000 mg | ORAL_TABLET | Freq: Two times a day (BID) | ORAL | Status: DC
  Administered 2020-11-12 – 2020-11-13 (×2): 50 mg via ORAL
  Filled 2020-11-12 (×2): qty 2

## 2020-11-12 MED ORDER — OXYCODONE HCL 5 MG PO TABS
5.0000 mg | ORAL_TABLET | Freq: Three times a day (TID) | ORAL | Status: DC | PRN
  Administered 2020-11-12: 5 mg via ORAL
  Filled 2020-11-12: qty 1

## 2020-11-12 MED ORDER — OXYCODONE-ACETAMINOPHEN 5-325 MG PO TABS
1.0000 | ORAL_TABLET | Freq: Once | ORAL | Status: AC
Start: 2020-11-12 — End: 2020-11-12
  Administered 2020-11-12: 1 via ORAL
  Filled 2020-11-12: qty 1

## 2020-11-12 MED ORDER — RISAQUAD PO CAPS
1.0000 | ORAL_CAPSULE | Freq: Two times a day (BID) | ORAL | Status: DC
  Administered 2020-11-12 – 2020-11-13 (×2): 1 via ORAL
  Filled 2020-11-12 (×2): qty 1

## 2020-11-12 MED ORDER — LORAZEPAM 2 MG/ML IJ SOLN
1.0000 mg | Freq: Once | INTRAMUSCULAR | Status: AC
  Administered 2020-11-12: 1 mg via INTRAVENOUS
  Filled 2020-11-12: qty 1

## 2020-11-12 MED ORDER — BUSPIRONE HCL 10 MG PO TABS
5.0000 mg | ORAL_TABLET | Freq: Three times a day (TID) | ORAL | Status: DC
  Administered 2020-11-12 – 2020-11-13 (×2): 5 mg via ORAL
  Filled 2020-11-12 (×2): qty 1

## 2020-11-12 MED ORDER — SODIUM CHLORIDE 0.9 % IV BOLUS
1000.0000 mL | Freq: Once | INTRAVENOUS | Status: AC
  Administered 2020-11-12: 1000 mL via INTRAVENOUS

## 2020-11-12 MED ORDER — AMLODIPINE BESYLATE 5 MG PO TABS
10.0000 mg | ORAL_TABLET | Freq: Every day | ORAL | Status: DC
  Administered 2020-11-12 – 2020-11-13 (×2): 10 mg via ORAL
  Filled 2020-11-12 (×2): qty 2

## 2020-11-12 MED ORDER — LACTATED RINGERS IV BOLUS
1000.0000 mL | Freq: Once | INTRAVENOUS | Status: AC
  Administered 2020-11-12: 1000 mL via INTRAVENOUS

## 2020-11-12 MED ORDER — CARVEDILOL 12.5 MG PO TABS
12.5000 mg | ORAL_TABLET | Freq: Two times a day (BID) | ORAL | Status: DC
  Administered 2020-11-12 – 2020-11-13 (×2): 12.5 mg via ORAL
  Filled 2020-11-12 (×2): qty 1

## 2020-11-12 MED ORDER — MIRTAZAPINE 15 MG PO TABS
30.0000 mg | ORAL_TABLET | Freq: Every day | ORAL | Status: DC
  Administered 2020-11-12: 30 mg via ORAL
  Filled 2020-11-12: qty 2

## 2020-11-12 MED ORDER — FENTANYL CITRATE (PF) 100 MCG/2ML IJ SOLN
50.0000 ug | Freq: Once | INTRAMUSCULAR | Status: AC
  Administered 2020-11-12: 50 ug via INTRAVENOUS
  Filled 2020-11-12: qty 2

## 2020-11-12 MED ORDER — FENTANYL CITRATE (PF) 100 MCG/2ML IJ SOLN
25.0000 ug | INTRAMUSCULAR | Status: DC | PRN
  Administered 2020-11-12: 25 ug via INTRAVENOUS
  Filled 2020-11-12: qty 2

## 2020-11-12 MED ORDER — FENTANYL CITRATE (PF) 100 MCG/2ML IJ SOLN: 25.0000 ug | INTRAMUSCULAR | Status: DC | PRN

## 2020-11-12 MED ORDER — MORPHINE SULFATE (PF) 4 MG/ML IV SOLN
5.0000 mg | Freq: Once | INTRAVENOUS | Status: AC
  Administered 2020-11-12: 5 mg via INTRAVENOUS
  Filled 2020-11-12: qty 2

## 2020-11-12 MED ORDER — CARBOXYMETHYLCELL-HYPROMELLOSE 0.25-0.3 % OP GEL: 1.0000 [drp] | Freq: Every day | OPHTHALMIC | Status: DC | PRN

## 2020-11-12 MED ORDER — METOCLOPRAMIDE HCL 5 MG/ML IJ SOLN
10.0000 mg | Freq: Once | INTRAMUSCULAR | Status: AC
  Administered 2020-11-12: 10 mg via INTRAVENOUS
  Filled 2020-11-12: qty 2

## 2020-11-12 MED ORDER — AMLODIPINE BESYLATE 5 MG PO TABS
10.0000 mg | ORAL_TABLET | Freq: Once | ORAL | Status: AC
  Administered 2020-11-12: 10 mg via ORAL
  Filled 2020-11-12: qty 2

## 2020-11-12 MED ORDER — PANTOPRAZOLE SODIUM 40 MG PO TBEC
40.0000 mg | DELAYED_RELEASE_TABLET | Freq: Two times a day (BID) | ORAL | Status: DC
  Administered 2020-11-12 – 2020-11-13 (×2): 40 mg via ORAL
  Filled 2020-11-12 (×2): qty 1

## 2020-11-12 MED ORDER — CEFAZOLIN IV (FOR PTA / DISCHARGE USE ONLY): 2.0000 g | Freq: Three times a day (TID) | INTRAVENOUS | Status: DC

## 2020-11-12 MED ORDER — LORAZEPAM 0.5 MG PO TABS
0.5000 mg | ORAL_TABLET | Freq: Two times a day (BID) | ORAL | Status: DC
  Administered 2020-11-12 – 2020-11-13 (×2): 0.5 mg via ORAL
  Filled 2020-11-12 (×2): qty 1

## 2020-11-12 MED ORDER — LABETALOL HCL 5 MG/ML IV SOLN
10.0000 mg | Freq: Once | INTRAVENOUS | Status: AC
  Administered 2020-11-12: 10 mg via INTRAVENOUS
  Filled 2020-11-12: qty 4

## 2020-11-12 MED ORDER — LOPERAMIDE HCL 2 MG PO CAPS: 6.0000 mg | ORAL_CAPSULE | Freq: Every day | ORAL | Status: DC | PRN

## 2020-11-12 MED ORDER — CHLORHEXIDINE GLUCONATE CLOTH 2 % EX PADS
6.0000 | MEDICATED_PAD | Freq: Every day | CUTANEOUS | Status: DC
  Administered 2020-11-12 – 2020-11-13 (×2): 6 via TOPICAL

## 2020-11-12 MED ORDER — CARVEDILOL 12.5 MG PO TABS
12.5000 mg | ORAL_TABLET | ORAL | Status: AC
  Administered 2020-11-12: 12.5 mg via ORAL
  Filled 2020-11-12: qty 1

## 2020-11-12 MED ORDER — CEFAZOLIN SODIUM-DEXTROSE 2-4 GM/100ML-% IV SOLN
2.0000 g | Freq: Three times a day (TID) | INTRAVENOUS | Status: DC
  Administered 2020-11-12 – 2020-11-13 (×2): 2 g via INTRAVENOUS
  Filled 2020-11-12 (×3): qty 100

## 2020-11-12 MED ORDER — ENOXAPARIN SODIUM 40 MG/0.4ML IJ SOSY
40.0000 mg | PREFILLED_SYRINGE | INTRAMUSCULAR | Status: DC
  Administered 2020-11-12: 40 mg via SUBCUTANEOUS
  Filled 2020-11-12: qty 0.4

## 2020-11-12 MED ORDER — CYCLOBENZAPRINE HCL 10 MG PO TABS: 10.0000 mg | ORAL_TABLET | Freq: Three times a day (TID) | ORAL | Status: DC | PRN

## 2020-11-12 MED ORDER — HYDRALAZINE HCL 25 MG PO TABS
50.0000 mg | ORAL_TABLET | Freq: Once | ORAL | Status: AC
  Administered 2020-11-12: 50 mg via ORAL
  Filled 2020-11-12: qty 2

## 2020-11-12 MED ORDER — FUROSEMIDE 40 MG PO TABS
40.0000 mg | ORAL_TABLET | Freq: Every day | ORAL | Status: DC
  Administered 2020-11-13: 40 mg via ORAL
  Filled 2020-11-12: qty 1

## 2020-11-12 MED ORDER — SODIUM CHLORIDE 0.9 % IV BOLUS: 1000.0000 mL | Freq: Once | INTRAVENOUS | Status: DC

## 2020-11-12 MED ORDER — METOCLOPRAMIDE HCL 5 MG/ML IJ SOLN
10.0000 mg | Freq: Four times a day (QID) | INTRAMUSCULAR | Status: DC
  Administered 2020-11-12 – 2020-11-13 (×3): 10 mg via INTRAVENOUS
  Filled 2020-11-12 (×3): qty 2

## 2020-11-12 MED ORDER — SODIUM CHLORIDE 0.9% FLUSH
10.0000 mL | INTRAVENOUS | Status: DC | PRN
  Administered 2020-11-13: 10 mL

## 2020-11-12 MED ORDER — DICYCLOMINE HCL 20 MG PO TABS: 20.0000 mg | ORAL_TABLET | Freq: Three times a day (TID) | ORAL | Status: DC | PRN

## 2020-11-12 NOTE — ED Provider Notes (Signed)
Emergency Medicine Provider Triage Evaluation Note  [name] , a 30 y.o. female  was evaluated in triage.  Pt complains of abdominal pain and back pain.  She states that the symptoms started yesterday.  She denies fevers.  Reports recent right BKA.  Reports recent admission for pneumonia.  Denies fevers.  Reports she has had vomiting.  Review of Systems  Positive: Abdominal pain, back pain Negative: Fever, cills  Physical Exam  BP (!) 190/122 (BP Location: Left Arm)   Pulse (!) 116   Temp 98.4 F (36.9 C) (Oral)   Resp 20   Ht 5' 7"  (1.702 m)   Wt 68 kg   SpO2 99%   BMI 23.49 kg/m  Gen:   Awake, uncomfortable appearing   Resp:  Normal effort  MSK:   Moves extremities without difficulty  Other:    Medical Decision Making  Medically screening exam initiated at 2:49 AM.  Appropriate orders placed.  Bryce Kimble Blazejewski was informed that the remainder of the evaluation will be completed by another provider, this initial triage assessment does not replace that evaluation, and the importance of remaining in the ED until their evaluation is complete.  Abdominal pain   Montine Circle, PA-C 11/12/20 0251    Orpah Greek, MD 11/12/20 [phone]

## 2020-11-12 NOTE — ED Triage Notes (Signed)
Pt complains of abdominal pain. Pt is crying and bending over.

## 2020-11-12 NOTE — ED Provider Notes (Signed)
[facility] DEPT Provider Note   CSN: [id number] Arrival date & time: 11/12/20  0214     History Chief Complaint  Patient presents with   Abdominal Pain    [name] is a 30 y.o. female.  Patient presents to the emergency department with complaints of back pain, abdominal pain, nausea and vomiting.  Patient does have a history of gastroparesis and poorly controlled diabetes.  Patient indicates that the nausea and vomiting are typical for her but she does not generally have back pain.      Past Medical History:  Diagnosis Date   Allergy    Anemia    Anxiety    Blood transfusion without reported diagnosis    Dec 2018   Cataract    right eye   COVID-19 03/2020   Depression    Diabetes type 1, uncontrolled ([place]) 11/14/2011   Since age 27   Fibromyalgia    Gastroparesis    GERD (gastroesophageal reflux disease)    Hypertension    Infection    UTI April 2016    Patient Active Problem List   Diagnosis Date Noted   Acute metabolic encephalopathy 04/02/7251   Multifocal pneumonia 11/03/2020   Type 1 diabetes mellitus with hypoglycemia without coma ([place]) 11/03/2020   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HCC)    Hypoxemic respiratory failure, chronic (HCC)    Abnormal CT of the chest    GI bleeding 05/12/2020   Iron deficiency 04/11/2020   Osteomyelitis ([place]) 04/10/2020   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 ([place]) 02/03/2020   Cannabinoid hyperemesis syndrome 04/30/2019   Contraception management 66/44/0347   Cyclical vomiting syndrome 06/07/2018   Diabetic gastroparesis associated with type 1 diabetes mellitus ([place]) 06/04/2018   Anxiety 05/31/2018   Peripheral edema 05/31/2018   Chronic anemia 07/10/2017   GERD (gastroesophageal reflux disease) 06/23/2017   MDD (major depressive disorder), recurrent episode, 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/2017   Severe protein-calorie malnutrition ([place]) 04/28/2013   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 ([place]) 11/14/2011   Hypertension associated with diabetes ([place]) 11/14/2011   Chronic abdominal pain 09/18/2011    Past Surgical History:  Procedure Laterality Date   AMPUTATION Right 04/13/2020   Procedure: 1ST RAY AMPUTATION RIGHT FOOT;  Surgeon: Newt Minion, MD;  Location: [facility];  Service: Orthopedics;  Laterality: Right;   AMPUTATION Right 06/06/2020   Procedure: RIGHT BELOW KNEE AMPUTATION;  Surgeon: Newt Minion, MD;  Location: [facility];  Service: Orthopedics;  Laterality: Right;   ANKLE SURGERY     APPLICATION OF WOUND VAC Right 06/06/2020   Procedure: APPLICATION OF WOUND VAC;  Surgeon: Newt Minion, MD;  Location: [facility];  Service: Orthopedics;  Laterality: Right;   CHOLECYSTECTOMY  11/15/2011   Procedure: LAPAROSCOPIC CHOLECYSTECTOMY WITH INTRAOPERATIVE CHOLANGIOGRAM;  Surgeon: [name], MD;  Location: [facility];  Service: General;  Laterality: N/A;   COLONOSCOPY     COLONOSCOPY WITH PROPOFOL N/A 06/27/2017   Procedure: COLONOSCOPY WITH PROPOFOL;  Surgeon: Milus Banister, MD;  [facility];  Service: Endoscopy;  Laterality: N/A;   ESOPHAGOGASTRODUODENOSCOPY  12/03/2011   Procedure: ESOPHAGOGASTRODUODENOSCOPY (EGD);  Surgeon: Beryle Beams, MD;  Location: [facility];  Service: Endoscopy;  Laterality: N/A;   FLEXIBLE SIGMOIDOSCOPY N/A 03/10/2017   Procedure: FLEXIBLE SIGMOIDOSCOPY;  Surgeon: Carol Ada, MD;  Location: [facility];  Service: Endoscopy;  Laterality: N/A;   INCISION AND DRAINAGE PERIRECTAL ABSCESS N/A 03/01/2017   Procedure: IRRIGATION AND DEBRIDEMENT PERIRECTAL ABSCESS;  Surgeon: Alphonsa Overall, MD;  Location: [facility];  Service: General;  Laterality: N/A;   IRRIGATION AND DEBRIDEMENT BUTTOCKS N/A 03/23/2017   Procedure: IRRIGATION AND DEBRIDEMENT BUTTOCKS, SETON PLACEMENT;  Surgeon: [name], MD;  Location: [facility];  Service: General;  Laterality: N/A;   LAPAROSCOPY  11/23/2011   Procedure: LAPAROSCOPY DIAGNOSTIC;  Surgeon: Edward Jolly, MD;  Location: [facility];  Service: General;  Laterality: N/A;   SIGMOIDOSCOPY     UPPER GASTROINTESTINAL ENDOSCOPY     WISDOM TOOTH EXTRACTION       OB History     Gravida  1   Para  1   Term      Preterm  1   AB      Living  1      SAB      IAB      Ectopic      Multiple  0   Live Births  1           Family History  Problem Relation Age of Onset   Diabetes Mother    Hypertension Father    Colon cancer Paternal Grandmother        pt thinks PGM was dx in her 36's   Diabetes Paternal Grandmother    Diabetes Maternal Grandmother    Diabetes Maternal Grandfather    Diabetes Paternal Grandfather    Diabetes Other    Breast cancer Paternal Aunt    Esophageal cancer Neg Hx    Liver cancer Neg Hx    Pancreatic cancer Neg Hx    Stomach cancer Neg Hx    Rectal cancer Neg Hx     Social History   Tobacco Use   Smoking status: Never   Smokeless tobacco: Never  Vaping Use   Vaping Use: Never used  Substance Use Topics   Alcohol use: No    Alcohol/week: 0.0 standard drinks   Drug use: Yes    Types: Marijuana    Home Medications Prior to Admission medications   Medication Sig Start Date End Date Taking? Authorizing Provider  Albuterol Sulfate (PROAIR RESPICLICK) 338 (90 Base) MCG/ACT AEPB Inhale 2 puffs into the lungs daily as needed. 11/08/20 12/08/20  Allwardt, Randa Evens, PA-C  amLODipine (NORVASC) 10 MG tablet Take 1 tablet (10 mg total) by mouth daily. 11/08/20   Allwardt, Randa Evens, PA-C  busPIRone (BUSPAR) 5 MG tablet Take 1 tablet (5 mg total) by mouth 3 (three) times daily. 02/03/20   [name], MD  Carboxymethylcell-Hypromellose (GENTEAL) 0.25-0.3 % GEL Place 1 drop into both eyes daily as needed (dryeyes).    [provider]  carvedilol (COREG) 12.5 MG tablet Take 1 tablet (12.5 mg total) by mouth 2 (two) times daily with a meal. 04/16/20 11/08/20  Dahal, Marlowe Aschoff, MD  ceFAZolin (ANCEF) IVPB Inject 2 g into the vein every 8 (eight)  hours for 20 days. Indication:  Wound infection  First Dose: Yes Last Day of Therapy:  12/01/20 Labs - Once weekly:  CBC/D and BMP, Labs - Every other week:  ESR and CRP Method of administration: IV Push Method of administration may be changed at the discretion of home infusion pharmacist based upon assessment of the patient and/or caregiver's ability to self-administer the medication ordered. 11/11/20 12/01/20  [place] Donnamarie Poag, DO  Continuous Blood Gluc Sensor (DEXCOM G6 SENSOR) MISC USE AS DIRECTED EVERY 10 DAYS 07/28/20   Renato Shin, MD  Continuous Blood Gluc Transmit (DEXCOM G6 TRANSMITTER) MISC USE AS  DIRECTED TO  MONITOR  GLUCOSE  LEVELS 05/10/20   Renato Shin, MD  cyclobenzaprine (FLEXERIL) 10 MG tablet TAKE 1 TABLET(10 MG) BY MOUTH THREE TIMES DAILY AS NEEDED FOR MUSCLE SPASMS Patient taking differently: Take 10 mg by mouth 3 (three) times daily as needed for muscle spasms. 10/16/20   [name], MD  dicyclomine (BENTYL) 20 MG tablet Take 1 tablet (20 mg total) by mouth every 8 (eight) hours as needed for spasms. 11/25/19   Petrucelli, Samantha R, PA-C  EPINEPHrine 0.3 mg/0.3 mL IJ SOAJ injection Inject 0.3 mg into the muscle once as needed (anaphylaxis/allergic reaction). Patient taking differently: Inject 0.3 mg into the muscle once as needed for anaphylaxis. 07/16/20   [name], MD  furosemide (LASIX) 40 MG tablet Take 1 tablet (40 mg total) by mouth daily. 05/24/20 11/20/20  Mercy Riding, MD  furosemide (LASIX) 40 MG tablet TAKE 1 TABLET (40 MG TOTAL) BY MOUTH DAILY. Patient taking differently: Take 40 mg by mouth daily. 05/24/20 05/24/21  Mercy Riding, MD  gabapentin (NEURONTIN) 100 MG capsule Take 200 mg by mouth 3 (three) times daily. 11/02/20   [provider]  hydrALAZINE (APRESOLINE) 50 MG tablet Take 50 mg by mouth 2 (two) times daily. 09/14/20   [provider]  insulin glargine (LANTUS) 100 UNIT/ML injection Inject 0.08 mLs (8 Units total) into the skin  daily. 11/05/20   [place] Eric J, DO  insulin lispro (HUMALOG) 100 UNIT/ML injection Inject 0-5 Units into the skin 3 (three) times daily before meals. Sliding scale    [provider]  Insulin Pen Needle 32G X 4 MM MISC USE AS DIRECTED WITH INSULIN PENS 05/24/20 05/24/21  Mercy Riding, MD  loperamide (IMODIUM) 2 MG capsule Take 6 mg by mouth daily as needed for diarrhea or loose stools.     [provider]  LORazepam (ATIVAN) 0.5 MG tablet TAKE 1 TABLET(0.5 MG) BY MOUTH TWICE DAILY AS NEEDED FOR ANXIETY Patient taking differently: Take 0.5 mg by mouth 2 (two) times daily. 06/18/20   [name], MD  metoCLOPramide (REGLAN) 5 MG tablet Take 1 tablet (5 mg total) by mouth in the morning and at bedtime. Please make and keep an appointment for further refills. Patient taking differently: Take 5 mg by mouth in the morning and at bedtime. 07/20/20   Armbruster, Carlota Raspberry, MD  mirtazapine (REMERON) 30 MG tablet TAKE 1 TABLET BY MOUTH AT  BEDTIME Patient taking differently: Take 30 mg by mouth at bedtime. 01/17/20   [name], MD  ondansetron (ZOFRAN ODT) 4 MG disintegrating tablet Take 1 tablet (4 mg total) by mouth every 8 (eight) hours as needed for nausea or vomiting. 11/08/20   Allwardt, Randa Evens, PA-C  oxyCODONE (ROXICODONE) 5 MG immediate release tablet Take 1 tablet (5 mg total) by mouth 3 (three) times daily as needed for severe pain. 10/16/20   Suzan Slick, NP  oxyCODONE-acetaminophen (PERCOCET) 10-325 MG tablet Take 1 tablet by mouth every 4 (four) hours as needed for pain. 11/02/20   [provider]  pantoprazole (PROTONIX) 40 MG tablet Take 1 tablet (40 mg total) by mouth 2 (two) times daily. 12/06/19   Armbruster, Carlota Raspberry, MD  potassium chloride SA (KLOR-CON) 20 MEQ tablet Take 1 tablet (20 mEq total) by mouth daily for 5 doses. 07/04/19 07/30/19  Janeece Fitting, PA-C    Allergies    Other and Lactose intolerance (gi)  Review of Systems   Review  of Systems   Gastrointestinal:  Positive for abdominal pain, nausea and vomiting.  Musculoskeletal:  Positive for back pain.  All other systems reviewed and are negative.  Physical Exam Updated Vital Signs BP (!) 187/108   Pulse 95   Temp 98.4 F (36.9 C) (Oral)   Resp 19   Ht 5' 7"  (1.702 m)   Wt 68 kg   SpO2 98%   BMI 23.49 kg/m   Physical Exam Vitals and nursing note reviewed.  Constitutional:      General: She is not in acute distress.    Appearance: Normal appearance. She is well-developed.  HENT:     Head: Normocephalic and atraumatic.     Right Ear: Hearing normal.     Left Ear: Hearing normal.     Nose: Nose normal.  Eyes:     Conjunctiva/sclera: Conjunctivae normal.     Pupils: Pupils are equal, round, and reactive to light.  Cardiovascular:     Rate and Rhythm: Regular rhythm.     Heart sounds: S1 normal and S2 normal. No murmur heard.   No friction rub. No gallop.  Pulmonary:     Effort: Pulmonary effort is normal. No respiratory distress.     Breath sounds: Normal breath sounds.  Chest:     Chest wall: No tenderness.  Abdominal:     General: Bowel sounds are normal.     Palpations: Abdomen is soft.     Tenderness: There is generalized abdominal tenderness. There is right CVA tenderness and left CVA tenderness. There is no guarding or rebound. Negative signs include Murphy's sign and McBurney's sign.     Hernia: No hernia is present.  Musculoskeletal:        General: Normal range of motion.     Cervical back: Normal range of motion and neck supple.  Skin:    General: Skin is warm and dry.     Findings: No rash.  Neurological:     Mental Status: She is alert and oriented to person, place, and time.     GCS: GCS eye subscore is 4. GCS verbal subscore is 5. GCS motor subscore is 6.     Cranial Nerves: No cranial nerve deficit.     Sensory: No sensory deficit.     Coordination: Coordination normal.  Psychiatric:        Speech: Speech normal.        Behavior:  Behavior normal.        Thought Content: Thought content normal.    ED Results / Procedures / Treatments   Labs (all labs ordered are listed, but only abnormal results are displayed) Labs Reviewed  COMPREHENSIVE METABOLIC PANEL - Abnormal; Notable for the following components:      Result Value   Potassium 6.6 (*)    Glucose, Bld 116 (*)    Creatinine, Ser 1.33 (*)    Albumin 5.1 (*)    AST 42 (*)    Total Bilirubin 1.4 (*)    GFR, Estimated 55 (*)    All other components within normal limits  CBC WITH DIFFERENTIAL/PLATELET - Abnormal; Notable for the following components:   RBC 2.91 (*)    Hemoglobin 8.8 (*)    HCT 27.0 (*)    All other components within normal limits  URINALYSIS, ROUTINE W REFLEX MICROSCOPIC - Abnormal; Notable for the following components:   Glucose, UA 50 (*)    Hgb urine dipstick SMALL (*)    Protein, ur >=300 (*)  All other components within normal limits  BETA-HYDROXYBUTYRIC ACID - Abnormal; Notable for the following components:   Beta-Hydroxybutyric Acid 1.24 (*)    All other components within normal limits  CBG MONITORING, ED - Abnormal; Notable for the following components:   Glucose-Capillary 134 (*)    All other components within normal limits  LIPASE, BLOOD  LACTIC ACID, PLASMA  MAGNESIUM  POTASSIUM  I-STAT BETA HCG BLOOD, ED ([facility], [facility], [facility] ONLY)    EKG None  Radiology DG Chest 2 View  Result Date: 11/12/2020 CLINICAL DATA:  Abdominal/back pain EXAM: CHEST - 2 VIEW COMPARISON:  11/03/2020 FINDINGS: Prior right lung pneumonia has resolved. Left lung is clear. No pleural effusion or pneumothorax. The heart is normal in size. Right arm PICC terminates at the cavoatrial junction. IMPRESSION: Prior right lung pneumonia has resolved. No evidence of acute cardiopulmonary disease. Electronically Signed   By: Julian Hy M.D.   On: 11/12/2020 03:03   CT RENAL STONE STUDY  Result Date: 11/12/2020 CLINICAL DATA:  Abdominal pain. EXAM: CT  ABDOMEN AND PELVIS WITHOUT CONTRAST TECHNIQUE: Multidetector CT imaging of the abdomen and pelvis was performed following the standard protocol without IV contrast. COMPARISON:  CT abdomen pelvis dated September 25, 2020. FINDINGS: Lower chest: No acute abnormality. Hepatobiliary: No focal liver abnormality is seen. Status post cholecystectomy. No biliary dilatation. Pancreas: Unremarkable. No pancreatic ductal dilatation or surrounding inflammatory changes. Spleen: Normal in size without focal abnormality. Adrenals/Urinary Tract: Adrenal glands are unremarkable. Kidneys are normal, without renal calculi, focal lesion, or hydronephrosis. Unchanged mild circumferential bladder wall thickening. Stomach/Bowel: Stomach is within normal limits. Appendix appears normal. No evidence of bowel wall thickening, distention, or inflammatory changes. Vascular/Lymphatic: No significant vascular findings are present. No enlarged abdominal or pelvic lymph nodes. Reproductive: Uterus and bilateral adnexa are unremarkable. Other: Unchanged small ascites.  No pneumoperitoneum. Musculoskeletal: No acute or significant osseous findings. New 1.7 cm intermediate density in the right lateral abdominal may be injection related. Unchanged diffuse anasarca. IMPRESSION: 1. No acute intra-abdominal process. 2. Unchanged small ascites and diffuse anasarca. Electronically Signed   By: Titus Dubin M.D.   On: 11/12/2020 05:45    Procedures Procedures   Medications Ordered in ED Medications  labetalol (NORMODYNE) injection 10 mg (has no administration in time range)  amLODipine (NORVASC) tablet 10 mg (has no administration in time range)  carvedilol (COREG) tablet 12.5 mg (has no administration in time range)  fentaNYL (SUBLIMAZE) injection 50 mcg (has no administration in time range)  sodium chloride 0.9 % bolus 1,000 mL (0 mLs Intravenous Stopped 11/12/20 0710)    Followed by  sodium chloride 0.9 % bolus 1,000 mL (1,000 mLs Intravenous  New Bag/Given 11/12/20 0436)  metoCLOPramide (REGLAN) injection 10 mg (10 mg Intravenous Given 11/12/20 0436)  LORazepam (ATIVAN) injection 1 mg (1 mg Intravenous Given 11/12/20 0436)    ED Course  I have reviewed the triage vital signs and the nursing notes.  Pertinent labs & imaging results that were available during my care of the patient were reviewed by me and considered in my medical decision making (see chart for details).  Clinical Course as of 11/13/20 0530  Mon Nov 12, 2020  0746 N/v, gastroparesois and back pain, CT stone normal, pending K [MK]    Clinical Course User Index [MK] Kommor, Debe Coder, MD   MDM Rules/Calculators/A&P  Patient standing at the sink, vomiting and drinking from the faucet.  She is tearful.  She indicates mid back pain that does reproduce with movement.  No specific tenderness.  No CVA tenderness.  Abdominal exam with diffuse tenderness.  Lab work reassuring.  She did have elevated potassium without kidney failure.  Potassium repeat pending  CT renal stone study does not show any acute pathology including no kidney stones, no blockages.  No sign of urinary tract infection.  Presentation consistent with her gastroparesis, treated symptomatically.  Patient does have a history of uncontrolled diabetes.  Blood sugar is 134 today.  No anion gap.  Normal bicarb on chemistries.  She does have a somewhat elevated beta hydroxybutyric acid, likely from vomiting and poor oral intake, no evidence of DKA at this time.  Patient aggressively hydrated here in the department.  Elevated blood pressure treated with her home meds and labetalol.  Sign out to oncoming ER physician to follow up repeat K+. Final Clinical Impression(s) / ED Diagnoses Final diagnoses:  Gastroparesis  Non-intractable vomiting with nausea, unspecified vomiting type  Primary hypertension    Rx / DC Orders ED Discharge Orders     None        Silvia Hightower, Gwenyth Allegra,  MD 11/13/20 [phone]

## 2020-11-12 NOTE — H&P (Signed)
History and Physical    HARMONIE VERRASTRO RKY:[id number] DOB: Aug 29, 1990 DOA: 11/12/2020  PCP: [name], MD (Confirm with patient/family/NH records and if not entered, this has to be entered at [facility] point of entry) Patient coming from: Home  I have personally briefly reviewed patient's old medical records in [place]  Chief Complaint: Abdominal pain, nauseous vomiting.  HPI: [name] is a 30 y.o. female with medical history significant of uncontrolled gastroparesis, recent left hallux osteomyelitis with GBS and staph aureus on Ancef, IDDM, CKD stage IIIb, anxiety depression, HTN, cyclic vomiting syndrome, GERD, anemia secondary to CKD, right BKA, chronic narcotic dependent on fentanyl patch, presented with recurrent epigastric pain and nauseous vomit.  Patient reported that her gastroparesis recently has been poorly controlled, has had frequent flareup with epigastric cramping-like pain, nauseous vomit, she went to see her GI doctor earlier this month who recommended gastric stimulator implantation.  This time, her epigastric cramping, nauseous vomit started yesterday, gradually getting worse, unable to eat or drink since last night.  She also developed a new onset of bilateral back pain, dull like, no urinary symptoms no fever chills no diarrhea.  ED Course: Renal ultrasound negative for kidney stones.  Initial blood work showed potassium 6.6, repeated potassium 3.1, creatinine 1.3 close to her baseline.  Review of Systems: As per HPI otherwise 14 point review of systems negative.    Past Medical History:  Diagnosis Date   Allergy    Anemia    Anxiety    Blood transfusion without reported diagnosis    Dec 2018   Cataract    right eye   COVID-19 03/2020   Depression    Diabetes type 1, uncontrolled ([place]) 11/14/2011   Since age 52   Fibromyalgia    Gastroparesis    GERD (gastroesophageal reflux disease)    Hypertension    Infection    UTI April 2016    Past  Surgical History:  Procedure Laterality Date   AMPUTATION Right 04/13/2020   Procedure: 1ST RAY AMPUTATION RIGHT FOOT;  Surgeon: Newt Minion, MD;  Location: [place];  Service: Orthopedics;  Laterality: Right;   AMPUTATION Right 06/06/2020   Procedure: RIGHT BELOW KNEE AMPUTATION;  Surgeon: Newt Minion, MD;  Location: [place];  Service: Orthopedics;  Laterality: Right;   ANKLE SURGERY     APPLICATION OF WOUND VAC Right 06/06/2020   Procedure: APPLICATION OF WOUND VAC;  Surgeon: Newt Minion, MD;  Location: [place];  Service: Orthopedics;  Laterality: Right;   CHOLECYSTECTOMY  11/15/2011   Procedure: LAPAROSCOPIC CHOLECYSTECTOMY WITH INTRAOPERATIVE CHOLANGIOGRAM;  Surgeon: [name], MD;  Location: [facility];  Service: General;  Laterality: N/A;   COLONOSCOPY     COLONOSCOPY WITH PROPOFOL N/A 06/27/2017   Procedure: COLONOSCOPY WITH PROPOFOL;  Surgeon: Milus Banister, MD;  Location: [facility];  Service: Endoscopy;  Laterality: N/A;   ESOPHAGOGASTRODUODENOSCOPY  12/03/2011   Procedure: ESOPHAGOGASTRODUODENOSCOPY (EGD);  Surgeon: Beryle Beams, MD;  Location: [facility];  Service: Endoscopy;  Laterality: N/A;   FLEXIBLE SIGMOIDOSCOPY N/A 03/10/2017   Procedure: FLEXIBLE SIGMOIDOSCOPY;  Surgeon: Carol Ada, MD;  Location: [facility];  Service: Endoscopy;  Laterality: N/A;   INCISION AND DRAINAGE PERIRECTAL ABSCESS N/A 03/01/2017   Procedure: IRRIGATION AND DEBRIDEMENT PERIRECTAL ABSCESS;  Surgeon: Alphonsa Overall, MD;  Location: [facility];  Service: General;  Laterality: N/A;   IRRIGATION AND DEBRIDEMENT BUTTOCKS N/A 03/23/2017   Procedure: IRRIGATION AND DEBRIDEMENT BUTTOCKS, SETON PLACEMENT;  Surgeon:  [name], MD;  Location: [facility];  Service: General;  Laterality: N/A;   LAPAROSCOPY  11/23/2011   Procedure: LAPAROSCOPY DIAGNOSTIC;  Surgeon: Edward Jolly, MD;  Location: [facility];  Service: General;  Laterality: N/A;   SIGMOIDOSCOPY     UPPER GASTROINTESTINAL ENDOSCOPY     WISDOM  TOOTH EXTRACTION       reports that she has never smoked. She has never used smokeless tobacco. She reports current drug use. Drug: Marijuana. She reports that she does not drink alcohol.  Allergies  Allergen Reactions   Other Anaphylaxis    Reaction to [place] nuts    Lactose Intolerance (Gi) Diarrhea    Family History  Problem Relation Age of Onset   Diabetes Mother    Hypertension Father    Colon cancer Paternal Grandmother        pt thinks PGM was dx in her 23's   Diabetes Paternal Grandmother    Diabetes Maternal Grandmother    Diabetes Maternal Grandfather    Diabetes Paternal Grandfather    Diabetes Other    Breast cancer Paternal Aunt    Esophageal cancer Neg Hx    Liver cancer Neg Hx    Pancreatic cancer Neg Hx    Stomach cancer Neg Hx    Rectal cancer Neg Hx      Prior to Admission medications   Medication Sig Start Date End Date Taking? Authorizing Provider  Albuterol Sulfate (PROAIR RESPICLICK) 462 (90 Base) MCG/ACT AEPB Inhale 2 puffs into the lungs daily as needed. Patient taking differently: Inhale 2 puffs into the lungs daily as needed (wheezing). 11/08/20 12/08/20 Yes Allwardt, Alyssa M, PA-C  amLODipine (NORVASC) 10 MG tablet Take 1 tablet (10 mg total) by mouth daily. 11/08/20  Yes Allwardt, Alyssa M, PA-C  busPIRone (BUSPAR) 5 MG tablet Take 1 tablet (5 mg total) by mouth 3 (three) times daily. 02/03/20  Yes [name], MD  Carboxymethylcell-Hypromellose (GENTEAL) 0.25-0.3 % GEL Place 1 drop into both eyes daily as needed (dryeyes).   Yes [provider]  carvedilol (COREG) 12.5 MG tablet Take 1 tablet (12.5 mg total) by mouth 2 (two) times daily with a meal. 04/16/20 11/12/20 Yes Dahal, Marlowe Aschoff, MD  ceFAZolin (ANCEF) IVPB Inject 2 g into the vein every 8 (eight) hours for 20 days. Indication:  Wound infection  First Dose: Yes Last Day of Therapy:  12/01/20 Labs - Once weekly:  CBC/D and BMP, Labs - Every other week:  ESR and CRP Method of  administration: IV Push Method of administration may be changed at the discretion of home infusion pharmacist based upon assessment of the patient and/or caregiver's ability to self-administer the medication ordered. 11/11/20 12/01/20 Yes [place] Eric J, DO  cyclobenzaprine (FLEXERIL) 10 MG tablet TAKE 1 TABLET(10 MG) BY MOUTH THREE TIMES DAILY AS NEEDED FOR MUSCLE SPASMS Patient taking differently: Take 10 mg by mouth 3 (three) times daily as needed for muscle spasms. 10/16/20  Yes [name], MD  dicyclomine (BENTYL) 20 MG tablet Take 1 tablet (20 mg total) by mouth every 8 (eight) hours as needed for spasms. 11/25/19  Yes Petrucelli, Samantha R, PA-C  furosemide (LASIX) 40 MG tablet Take 1 tablet (40 mg total) by mouth daily. 05/24/20 11/20/20 Yes Mercy Riding, MD  furosemide (LASIX) 40 MG tablet TAKE 1 TABLET (40 MG TOTAL) BY MOUTH DAILY. Patient taking differently: Take 40 mg by mouth daily. 05/24/20 05/24/21 Yes Mercy Riding, MD  gabapentin (NEURONTIN) 100 MG capsule  Take 200 mg by mouth 3 (three) times daily. 11/02/20  Yes [provider]  hydrALAZINE (APRESOLINE) 50 MG tablet Take 50 mg by mouth 4 (four) times daily. 09/14/20  Yes [provider]  insulin glargine (LANTUS) 100 UNIT/ML injection Inject 0.08 mLs (8 Units total) into the skin daily. 11/05/20  Yes [place] Eric J, DO  insulin lispro (HUMALOG) 100 UNIT/ML injection Inject 0-5 Units into the skin 3 (three) times daily before meals. Sliding scale   Yes [provider]  loperamide (IMODIUM) 2 MG capsule Take 6 mg by mouth daily as needed for diarrhea or loose stools.    Yes [provider]  LORazepam (ATIVAN) 0.5 MG tablet TAKE 1 TABLET(0.5 MG) BY MOUTH TWICE DAILY AS NEEDED FOR ANXIETY Patient taking differently: Take 0.5 mg by mouth 2 (two) times daily. 06/18/20  Yes [name], MD  metoCLOPramide (REGLAN) 5 MG tablet Take 1 tablet (5 mg total) by mouth in the morning and at bedtime. Please make and  keep an appointment for further refills. Patient taking differently: Take 5 mg by mouth in the morning and at bedtime. 07/20/20  Yes Armbruster, Carlota Raspberry, MD  mirtazapine (REMERON) 30 MG tablet TAKE 1 TABLET BY MOUTH AT  BEDTIME Patient taking differently: Take 30 mg by mouth at bedtime. 01/17/20  Yes [name], MD  ondansetron (ZOFRAN ODT) 4 MG disintegrating tablet Take 1 tablet (4 mg total) by mouth every 8 (eight) hours as needed for nausea or vomiting. 11/08/20  Yes Allwardt, Alyssa M, PA-C  oxyCODONE (ROXICODONE) 5 MG immediate release tablet Take 1 tablet (5 mg total) by mouth 3 (three) times daily as needed for severe pain. 10/16/20  Yes Suzan Slick, NP  oxyCODONE-acetaminophen (PERCOCET) 10-325 MG tablet Take 1 tablet by mouth every 4 (four) hours as needed for pain. 11/02/20  Yes [provider]  pantoprazole (PROTONIX) 40 MG tablet Take 1 tablet (40 mg total) by mouth 2 (two) times daily. 12/06/19  Yes Armbruster, Carlota Raspberry, MD  ceFEPIme 2 g in sodium chloride 0.9 % 100 mL Inject 2 g into the vein every 12 (twelve) hours. Start date : 11/06/20    [provider]  Continuous Blood Gluc Sensor (DEXCOM G6 SENSOR) MISC USE AS DIRECTED EVERY 10 DAYS 07/28/20   Renato Shin, MD  Continuous Blood Gluc Transmit (DEXCOM G6 TRANSMITTER) MISC USE AS DIRECTED TO  MONITOR  GLUCOSE  LEVELS 05/10/20   Renato Shin, MD  EPINEPHrine 0.3 mg/0.3 mL IJ SOAJ injection Inject 0.3 mg into the muscle once as needed (anaphylaxis/allergic reaction). Patient taking differently: Inject 0.3 mg into the muscle once as needed for anaphylaxis. 07/16/20   [name], MD  Insulin Pen Needle 32G X 4 MM MISC USE AS DIRECTED WITH INSULIN PENS 05/24/20 05/24/21  Mercy Riding, MD  potassium chloride SA (KLOR-CON) 20 MEQ tablet Take 1 tablet (20 mEq total) by mouth daily for 5 doses. 07/04/19 07/30/19  Janeece Fitting, PA-C    Physical Exam: Vitals:   11/12/20 1330 11/12/20 1345 11/12/20 1515 11/12/20 1532   BP: (!) 163/90 (!) 167/88 (!) 167/96 (!) 157/92  Pulse: 100 (!) 102 (!) 101 99  Resp: 18   18  Temp:      TempSrc:      SpO2: 96% 99% 98% 98%  Weight:      Height:        Constitutional: NAD, calm, comfortable Vitals:   11/12/20 1330 11/12/20 1345 11/12/20 1515 11/12/20  1532  BP: (!) 163/90 (!) 167/88 (!) 167/96 (!) 157/92  Pulse: 100 (!) 102 (!) 101 99  Resp: 18   18  Temp:      TempSrc:      SpO2: 96% 99% 98% 98%  Weight:      Height:       Eyes: PERRL, lids and conjunctivae normal ENMT: Mucous membranes are dry, Posterior pharynx clear of any exudate or lesions.Normal dentition.  Neck: normal, supple, no masses, no thyromegaly Respiratory: clear to auscultation bilaterally, no wheezing, no crackles. Normal respiratory effort. No accessory muscle use.  Cardiovascular: Regular rate and rhythm, no murmurs / rubs / gallops. No extremity edema. 2+ pedal pulses. No carotid bruits.  Abdomen: mild tenderness on epigastric area, no rebound no guarding, no masses palpated. No hepatosplenomegaly. Bowel sounds positive.  Musculoskeletal: no clubbing / cyanosis. No joint deformity upper and lower extremities. Good ROM, no contractures. Normal muscle tone.  Skin: Left toe chronic wound Neurologic: CN 2-12 grossly intact. Sensation intact, DTR normal. Strength 5/5 in all 4.  Psychiatric: Normal judgment and insight. Alert and oriented x 3. Normal mood.     Labs on Admission: I have personally reviewed following labs and imaging studies  CBC: Recent Labs  Lab 11/12/20 0407  WBC 8.1  NEUTROABS 6.3  HGB 8.8*  HCT 27.0*  MCV 92.8  PLT 283   Basic Metabolic Panel: Recent Labs  Lab 11/12/20 0407 11/12/20 0619  NA 136  --   K 6.6* 3.1*  CL 103  --   CO2 24  --   GLUCOSE 116*  --   BUN 18  --   CREATININE 1.33*  --   CALCIUM 10.2  --   MG 1.9  --    GFR: Estimated Creatinine Clearance: 59.6 mL/min (A) (by C-G formula based on SCr of 1.33 mg/dL (H)). Liver Function  Tests: Recent Labs  Lab 11/12/20 0407  AST 42*  ALT 8  ALKPHOS 45  BILITOT 1.4*  PROT 8.0  ALBUMIN 5.1*   Recent Labs  Lab 11/12/20 0407  LIPASE 21   No results for input(s): AMMONIA in the last 168 hours. Coagulation Profile: No results for input(s): INR, PROTIME in the last 168 hours. Cardiac Enzymes: No results for input(s): CKTOTAL, CKMB, CKMBINDEX, TROPONINI in the last 168 hours. BNP (last 3 results) No results for input(s): PROBNP in the last 8760 hours. HbA1C: No results for input(s): HGBA1C in the last 72 hours. CBG: Recent Labs  Lab 11/05/20 2128 11/06/20 0749 11/12/20 0243  GLUCAP 183* 143* 134*   Lipid Profile: No results for input(s): CHOL, HDL, LDLCALC, TRIG, CHOLHDL, LDLDIRECT in the last 72 hours. Thyroid Function Tests: No results for input(s): TSH, T4TOTAL, FREET4, T3FREE, THYROIDAB in the last 72 hours. Anemia Panel: No results for input(s): VITAMINB12, FOLATE, FERRITIN, TIBC, IRON, RETICCTPCT in the last 72 hours. Urine analysis:    Component Value Date/Time   COLORURINE YELLOW 11/12/2020 0322   APPEARANCEUR CLEAR 11/12/2020 0322   LABSPEC 1.016 11/12/2020 0322   PHURINE 6.0 11/12/2020 0322   GLUCOSEU 50 (A) 11/12/2020 0322   HGBUR SMALL (A) 11/12/2020 0322   BILIRUBINUR NEGATIVE 11/12/2020 0322   BILIRUBINUR negative 01/02/2015 1717   KETONESUR NEGATIVE 11/12/2020 0322   PROTEINUR >=300 (A) 11/12/2020 0322   UROBILINOGEN 0.2 01/13/2015 0223   NITRITE NEGATIVE 11/12/2020 0322   LEUKOCYTESUR NEGATIVE 11/12/2020 0322    Radiological Exams on Admission: DG Chest 2 View  Result Date: 11/12/2020 CLINICAL DATA:  Abdominal/back pain EXAM: CHEST - 2 VIEW COMPARISON:  11/03/2020 FINDINGS: Prior right lung pneumonia has resolved. Left lung is clear. No pleural effusion or pneumothorax. The heart is normal in size. Right arm PICC terminates at the cavoatrial junction. IMPRESSION: Prior right lung pneumonia has resolved. No evidence of acute  cardiopulmonary disease. Electronically Signed   By: Julian Hy M.D.   On: 11/12/2020 03:03   CT RENAL STONE STUDY  Result Date: 11/12/2020 CLINICAL DATA:  Abdominal pain. EXAM: CT ABDOMEN AND PELVIS WITHOUT CONTRAST TECHNIQUE: Multidetector CT imaging of the abdomen and pelvis was performed following the standard protocol without IV contrast. COMPARISON:  CT abdomen pelvis dated September 25, 2020. FINDINGS: Lower chest: No acute abnormality. Hepatobiliary: No focal liver abnormality is seen. Status post cholecystectomy. No biliary dilatation. Pancreas: Unremarkable. No pancreatic ductal dilatation or surrounding inflammatory changes. Spleen: Normal in size without focal abnormality. Adrenals/Urinary Tract: Adrenal glands are unremarkable. Kidneys are normal, without renal calculi, focal lesion, or hydronephrosis. Unchanged mild circumferential bladder wall thickening. Stomach/Bowel: Stomach is within normal limits. Appendix appears normal. No evidence of bowel wall thickening, distention, or inflammatory changes. Vascular/Lymphatic: No significant vascular findings are present. No enlarged abdominal or pelvic lymph nodes. Reproductive: Uterus and bilateral adnexa are unremarkable. Other: Unchanged small ascites.  No pneumoperitoneum. Musculoskeletal: No acute or significant osseous findings. New 1.7 cm intermediate density in the right lateral abdominal may be injection related. Unchanged diffuse anasarca. IMPRESSION: 1. No acute intra-abdominal process. 2. Unchanged small ascites and diffuse anasarca. Electronically Signed   By: Titus Dubin M.D.   On: 11/12/2020 05:45    EKG: Ordered  Assessment/Plan Active Problems:   Cyclical vomiting syndrome   Intractable nausea and vomiting  (please populate well all problems here in Problem List. (For example, if patient is on BP meds at home and you resume or decide to hold them, it is a problem that needs to be her. Same for CAD, COPD, HLD and so  on)  Recurrent nauseous vomit/abdominal pain -Likely secondary to flareup of gastroparesis -Change Reglan to IV every 6 hours -Hold Lasix today, encourage p.o. intake if tolerated. -Expect less than 2 midnight hospital stay, once symptoms improved can switch Reglan back to p.o. and discharged home.  Left toe osteomyelitis -Continue long-term Ancef via PICC line  HTN -Resume home BP regimen of Coreg, hydralazine, hold Lasix today for dehydration.  IBS -Continue Bentyl  IDDM -Sliding scale.  DVT prophylaxis: Lovenox Code Status: Full code Family Communication: None at bedside Disposition Plan: Expect less than 2 midnight hospital stay Consults called: None Admission status: MedSurg observation   Lequita Halt MD [facility] Pager [phone]  11/12/2020, 4:00 PM

## 2020-11-12 NOTE — ED Notes (Signed)
Patient transported to X-ray 

## 2020-11-12 NOTE — ED Notes (Signed)
ED TO INPATIENT HANDOFF REPORT  ED Nurse Name and Phone #: [phone] Erick Colace, RN  S Name/Age/Gender [name] 30 y.o. female Room/Bed: WA20/WA20  Code Status   Code Status: Full Code  Home/SNF/Other Home Patient oriented to: self, place, time and situation Is this baseline? Yes   Triage Complete: Triage complete  Chief Complaint Intractable nausea and vomiting [R11.2]  Triage Note Pt complains of abdominal pain. Pt is crying and bending over.     Allergies Allergies  Allergen Reactions  . Other Anaphylaxis    Reaction to [place] nuts   . Lactose Intolerance (Gi) Diarrhea    Level of Care/Admitting Diagnosis ED Disposition    ED Disposition  Admit   Condition  --   Comment  Hospital Area: [facility] [[id number]]  Level of Care: Med-Surg [16]  May place patient in observation at [facility] or [place] if equivalent level of care is available:: No  Covid Evaluation: Asymptomatic Screening Protocol (No Symptoms)  Diagnosis: Intractable nausea and vomiting [016010]  Admitting Physician: Lequita Halt [[id number]]  Attending Physician: Lequita Halt [[id number]]         B Medical/Surgery History Past Medical History:  Diagnosis Date  . Allergy   . Anemia   . Anxiety   . Blood transfusion without reported diagnosis    Dec 2018  . Cataract    right eye  . COVID-19 03/2020  . Depression   . Diabetes type 1, uncontrolled ([place]) 11/14/2011   Since age 29  . Fibromyalgia   . Gastroparesis   . GERD (gastroesophageal reflux disease)   . Hypertension   . Infection    UTI April 2016   Past Surgical History:  Procedure Laterality Date  . AMPUTATION Right 04/13/2020   Procedure: 1ST RAY AMPUTATION RIGHT FOOT;  Surgeon: Newt Minion, MD;  Location: [facility];  Service: Orthopedics;  Laterality: Right;  . AMPUTATION Right 06/06/2020   Procedure: RIGHT BELOW KNEE AMPUTATION;  Surgeon: Newt Minion, MD;  Location: [facility];  Service:  Orthopedics;  Laterality: Right;  . ANKLE SURGERY    . APPLICATION OF WOUND VAC Right 06/06/2020   Procedure: APPLICATION OF WOUND VAC;  Surgeon: Newt Minion, MD;  Location: [facility];  Service: Orthopedics;  Laterality: Right;  . CHOLECYSTECTOMY  11/15/2011   Procedure: LAPAROSCOPIC CHOLECYSTECTOMY WITH INTRAOPERATIVE CHOLANGIOGRAM;  Surgeon: [name], MD;  [facility];  Service: General;  Laterality: N/A;  . COLONOSCOPY    . COLONOSCOPY WITH PROPOFOL N/A 06/27/2017   Procedure: COLONOSCOPY WITH PROPOFOL;  Surgeon: Milus Banister, MD;  [facility];  Service: Endoscopy;  Laterality: N/A;  . ESOPHAGOGASTRODUODENOSCOPY  12/03/2011   Procedure: ESOPHAGOGASTRODUODENOSCOPY (EGD);  Surgeon: Beryle Beams, MD;  [facility];  Service: Endoscopy;  Laterality: N/A;  . FLEXIBLE SIGMOIDOSCOPY N/A 03/10/2017   Procedure: FLEXIBLE SIGMOIDOSCOPY;  Surgeon: Carol Ada, MD;  [facility];  Service: Endoscopy;  Laterality: N/A;  . INCISION AND DRAINAGE PERIRECTAL ABSCESS N/A 03/01/2017   Procedure: IRRIGATION AND DEBRIDEMENT PERIRECTAL ABSCESS;  Surgeon: Alphonsa Overall, MD;  [facility];  Service: General;  Laterality: N/A;  . IRRIGATION AND DEBRIDEMENT BUTTOCKS N/A 03/23/2017   Procedure: IRRIGATION AND DEBRIDEMENT BUTTOCKS, SETON PLACEMENT;  Surgeon: [name], MD;  [facility];  Service: General;  Laterality: N/A;  . LAPAROSCOPY  11/23/2011   Procedure: LAPAROSCOPY DIAGNOSTIC;  Surgeon: Edward Jolly, MD;  [facility];  Service: General;  Laterality: N/A;  .  SIGMOIDOSCOPY    . UPPER GASTROINTESTINAL ENDOSCOPY    . WISDOM TOOTH EXTRACTION       A IV Location/Drains/Wounds Patient Lines/Drains/Airways Status    Active Line/Drains/Airways    Name Placement date Placement time Site Days   PICC Double Lumen 28/36/62 Right Basilic 94/76/54  6503  -- 9   Incision (Closed) 06/06/20 Leg Right 06/06/20  1402  -- 159   Wound / Incision (Open or  Dehisced) 11/04/20 Incision - Open Toe (Comment  which one) Anterior;Left big toe incision with sutures 11/04/20  1200  Toe (Comment  which one)  8          Intake/Output Last 24 hours  Intake/Output Summary (Last 24 hours) at 11/12/2020 1926 Last data filed at 11/12/2020 0754 Gross per 24 hour  Intake 2000 ml  Output --  Net 2000 ml    Labs/Imaging Results for orders placed or performed during the hospital encounter of 11/12/20 (from the past 48 hour(s))  CBG monitoring, ED     Status: Abnormal   Collection Time: 11/12/20  2:43 AM  Result Value Ref Range   Glucose-Capillary 134 (H) 70 - 99 mg/dL    Comment: Glucose reference range applies only to samples taken after fasting for at least 8 hours.  Urinalysis, Routine w reflex microscopic Urine, Clean Catch     Status: Abnormal   Collection Time: 11/12/20  3:22 AM  Result Value Ref Range   Color, Urine YELLOW YELLOW   APPearance CLEAR CLEAR   Specific Gravity, Urine 1.016 1.005 - 1.030   pH 6.0 5.0 - 8.0   Glucose, UA 50 (A) NEGATIVE mg/dL   Hgb urine dipstick SMALL (A) NEGATIVE   Bilirubin Urine NEGATIVE NEGATIVE   Ketones, ur NEGATIVE NEGATIVE mg/dL   Protein, ur >=300 (A) NEGATIVE mg/dL   Nitrite NEGATIVE NEGATIVE   Leukocytes,Ua NEGATIVE NEGATIVE   RBC / HPF 21-50 0 - 5 RBC/hpf   WBC, UA 0-5 0 - 5 WBC/hpf   Bacteria, UA NONE SEEN NONE SEEN   Squamous Epithelial / LPF 0-5 0 - 5   Mucus PRESENT    Hyaline Casts, UA PRESENT     Comment: Performed at [facility], [place] [address], [place], [place] [address]  Comprehensive metabolic panel     Status: Abnormal   Collection Time: 11/12/20  4:07 AM  Result Value Ref Range   Sodium 136 135 - 145 mmol/L   Potassium 6.6 (HH) 3.5 - 5.1 mmol/L    Comment: CRITICAL RESULT CALLED TO, READ BACK BY AND VERIFIED WITH: LYNCH,J. 11/12/20 @0601  BY SEELMOL    Chloride 103 98 - 111 mmol/L   CO2 24 22 - 32 mmol/L   Glucose, Bld 116 (H) 70 - 99 mg/dL    Comment:  Glucose reference range applies only to samples taken after fasting for at least 8 hours.   BUN 18 6 - 20 mg/dL   Creatinine, Ser 1.33 (H) 0.44 - 1.00 mg/dL   Calcium 10.2 8.9 - 10.3 mg/dL   Total Protein 8.0 6.5 - 8.1 g/dL   Albumin 5.1 (H) 3.5 - 5.0 g/dL   AST 42 (H) 15 - 41 U/L   ALT 8 0 - 44 U/L   Alkaline Phosphatase 45 38 - 126 U/L   Total Bilirubin 1.4 (H) 0.3 - 1.2 mg/dL   GFR, Estimated 55 (L) >60 mL/min    Comment: (NOTE) Calculated using the CKD-EPI Creatinine Equation (2021)    Anion gap 9 5 -  15    Comment: Performed at [facility], [place] [address], [place], [place] [address]  Lipase, blood     Status: None   Collection Time: 11/12/20  4:07 AM  Result Value Ref Range   Lipase 21 11 - 51 U/L    Comment: Performed at [facility], [place] [address], [place], [place] [address]  CBC with Diff     Status: Abnormal   Collection Time: 11/12/20  4:07 AM  Result Value Ref Range   WBC 8.1 4.0 - 10.5 K/uL   RBC 2.91 (L) 3.87 - 5.11 MIL/uL   Hemoglobin 8.8 (L) 12.0 - 15.0 g/dL   HCT 27.0 (L) 36.0 - 46.0 %   MCV 92.8 80.0 - 100.0 fL   MCH 30.2 26.0 - 34.0 pg   MCHC 32.6 30.0 - 36.0 g/dL   RDW 14.0 11.5 - 15.5 %   Platelets 208 150 - 400 K/uL   nRBC 0.0 0.0 - 0.2 %   Neutrophils Relative % 78 %   Neutro Abs 6.3 1.7 - 7.7 K/uL   Lymphocytes Relative 14 %   Lymphs Abs 1.1 0.7 - 4.0 K/uL   Monocytes Relative 6 %   Monocytes Absolute 0.5 0.1 - 1.0 K/uL   Eosinophils Relative 1 %   Eosinophils Absolute 0.1 0.0 - 0.5 K/uL   Basophils Relative 1 %   Basophils Absolute 0.1 0.0 - 0.1 K/uL   Immature Granulocytes 0 %   Abs Immature Granulocytes 0.03 0.00 - 0.07 K/uL    Comment: Performed at [facility], [name] [address] [place], [place] [address]  Beta-hydroxybutyric acid     Status: Abnormal   Collection Time: 11/12/20  4:07 AM  Result Value Ref Range   Beta-Hydroxybutyric Acid 1.24 (H) 0.05 - 0.27 mmol/L    Comment:  Performed at [facility], [place] [address], [place], [place] [address]  Magnesium     Status: None   Collection Time: 11/12/20  4:07 AM  Result Value Ref Range   Magnesium 1.9 1.7 - 2.4 mg/dL    Comment: Performed at [facility], [place] [address], [place], [place] [address]  I-Stat beta hCG blood, ED     Status: None   Collection Time: 11/12/20  4:16 AM  Result Value Ref Range   I-stat hCG, quantitative <5.0 <5 mIU/mL   Comment 3            Comment:   GEST. AGE      CONC.  (mIU/mL)   <=1 WEEK        5 - 50     2 WEEKS       50 - 500     3 WEEKS       100 - 10,000     4 WEEKS     1,000 - 30,000        FEMALE AND NON-PREGNANT FEMALE:     LESS THAN 5 mIU/mL   Lactic acid, plasma     Status: None   Collection Time: 11/12/20  4:40 AM  Result Value Ref Range   Lactic Acid, Venous 0.8 0.5 - 1.9 mmol/L    Comment: Performed at [facility], [place] [address], [place],  [address]  Potassium     Status: Abnormal   Collection Time: 11/12/20  6:19 AM  Result Value Ref Range   Potassium 3.1 (L) 3.5 - 5.1 mmol/L    Comment: DELTA CHECK  NOTED Performed at [facility], [place] [address], [place], [place] [address]   Resp Panel by RT-PCR (Flu A&B, Covid) Nasopharyngeal Swab     Status: None   Collection Time: 11/12/20  2:30 PM   Specimen: Nasopharyngeal Swab; Nasopharyngeal(NP) swabs in vial transport medium  Result Value Ref Range   SARS Coronavirus 2 by RT PCR NEGATIVE NEGATIVE    Comment: (NOTE) SARS-CoV-2 target nucleic acids are NOT DETECTED.  The SARS-CoV-2 RNA is generally detectable in upper respiratory specimens during the acute phase of infection. The lowest concentration of SARS-CoV-2 viral copies this assay can detect is 138 copies/mL. A negative result does not preclude SARS-Cov-2 infection and should not be used as the sole basis for treatment or other patient management decisions. A negative result  may occur with  improper specimen collection/handling, submission of specimen other than nasopharyngeal swab, presence of viral mutation(s) within the areas targeted by this assay, and inadequate number of viral copies(<138 copies/mL). A negative result must be combined with clinical observations, patient history, and epidemiological information. The expected result is Negative.  Fact Sheet for Patients:  EntrepreneurPulse.com.au  Fact Sheet for Healthcare Providers:  IncredibleEmployment.be  This test is no t yet approved or cleared by the [place] FDA and  has been authorized for detection and/or diagnosis of SARS-CoV-2 by FDA under an Emergency Use Authorization (EUA). This EUA will remain  in effect (meaning this test can be used) for the duration of the COVID-19 declaration under Section 564(b)(1) of the Act, 21 U.S.C.section 360bbb-3(b)(1), unless the authorization is terminated  or revoked sooner.       Influenza A by PCR NEGATIVE NEGATIVE   Influenza B by PCR NEGATIVE NEGATIVE    Comment: (NOTE) The Xpert Xpress SARS-CoV-2/FLU/RSV plus assay is intended as an aid in the diagnosis of influenza from Nasopharyngeal swab specimens and should not be used as a sole basis for treatment. Nasal washings and aspirates are unacceptable for Xpert Xpress SARS-CoV-2/FLU/RSV testing.  Fact Sheet for Patients: EntrepreneurPulse.com.au  Fact Sheet for Healthcare Providers: IncredibleEmployment.be  This test is not yet approved or cleared by the [place] FDA and has been authorized for detection and/or diagnosis of SARS-CoV-2 by FDA under an Emergency Use Authorization (EUA). This EUA will remain in effect (meaning this test can be used) for the duration of the COVID-19 declaration under Section 564(b)(1) of the Act, 21 U.S.C. section 360bbb-3(b)(1), unless the authorization is terminated  or revoked.  Performed at [facility], [place] [address] [place], [place] [address]    DG Chest 2 View  Result Date: 11/12/2020 CLINICAL DATA:  Abdominal/back pain EXAM: CHEST - 2 VIEW COMPARISON:  11/03/2020 FINDINGS: Prior right lung pneumonia has resolved. Left lung is clear. No pleural effusion or pneumothorax. The heart is normal in size. Right arm PICC terminates at the cavoatrial junction. IMPRESSION: Prior right lung pneumonia has resolved. No evidence of acute cardiopulmonary disease. Electronically Signed   By: Julian Hy M.D.   On: 11/12/2020 03:03   CT RENAL STONE STUDY  Result Date: 11/12/2020 CLINICAL DATA:  Abdominal pain. EXAM: CT ABDOMEN AND PELVIS WITHOUT CONTRAST TECHNIQUE: Multidetector CT imaging of the abdomen and pelvis was performed following the standard protocol without IV contrast. COMPARISON:  CT abdomen pelvis dated September 25, 2020. FINDINGS: Lower chest: No acute abnormality. Hepatobiliary: No focal liver abnormality is seen. Status post cholecystectomy. No biliary dilatation. Pancreas: Unremarkable. No pancreatic ductal dilatation or surrounding inflammatory changes. Spleen: Normal in size  without focal abnormality. Adrenals/Urinary Tract: Adrenal glands are unremarkable. Kidneys are normal, without renal calculi, focal lesion, or hydronephrosis. Unchanged mild circumferential bladder wall thickening. Stomach/Bowel: Stomach is within normal limits. Appendix appears normal. No evidence of bowel wall thickening, distention, or inflammatory changes. Vascular/Lymphatic: No significant vascular findings are present. No enlarged abdominal or pelvic lymph nodes. Reproductive: Uterus and bilateral adnexa are unremarkable. Other: Unchanged small ascites.  No pneumoperitoneum. Musculoskeletal: No acute or significant osseous findings. New 1.7 cm intermediate density in the right lateral abdominal may be injection related. Unchanged diffuse anasarca.  IMPRESSION: 1. No acute intra-abdominal process. 2. Unchanged small ascites and diffuse anasarca. Electronically Signed   By: Titus Dubin M.D.   On: 11/12/2020 05:45    Pending Labs Unresulted Labs (From admission, onward)    Start     Ordered   11/13/20 1740  Basic metabolic panel  Tomorrow morning,   R        11/12/20 1344   11/13/20 0500  Magnesium  Tomorrow morning,   R        11/12/20 1344   11/12/20 1204  Comprehensive metabolic panel  ONCE - STAT,   STAT        11/12/20 1203          Vitals/Pain Today's Vitals   11/12/20 1713 11/12/20 1746 11/12/20 1830 11/12/20 1900  BP:  (!) 174/99 (!) 172/98 (!) 177/99  Pulse:  97 99 95  Resp:  20 12 15   Temp:    98.2 F (36.8 C)  TempSrc:    Oral  SpO2:  100% 98% 100%  Weight:      Height:      PainSc: 7        Isolation Precautions Airborne and Contact precautions  Medications Medications  labetalol (NORMODYNE) injection 10 mg (has no administration in time range)  metoCLOPramide (REGLAN) injection 10 mg (10 mg Intravenous Given 11/12/20 1900)  furosemide (LASIX) tablet 40 mg (has no administration in time range)  oxyCODONE (Oxy IR/ROXICODONE) immediate release tablet 5 mg (has no administration in time range)  ceFAZolin (ANCEF) IVPB (has no administration in time range)  amLODipine (NORVASC) tablet 10 mg (has no administration in time range)  lactobacillus acidophilus (BACID) tablet 2 tablet (has no administration in time range)  carvedilol (COREG) tablet 12.5 mg (has no administration in time range)  hydrALAZINE (APRESOLINE) tablet 50 mg (has no administration in time range)  busPIRone (BUSPAR) tablet 5 mg (has no administration in time range)  LORazepam (ATIVAN) tablet 0.5 mg (has no administration in time range)  mirtazapine (REMERON) tablet 30 mg (has no administration in time range)  insulin glargine-yfgn (SEMGLEE) injection 8 Units (has no administration in time range)  dicyclomine (BENTYL) tablet 20 mg (has no  administration in time range)  loperamide (IMODIUM) capsule 6 mg (has no administration in time range)  pantoprazole (PROTONIX) EC tablet 40 mg (has no administration in time range)  cyclobenzaprine (FLEXERIL) tablet 10 mg (has no administration in time range)  gabapentin (NEURONTIN) capsule 200 mg (has no administration in time range)  Albuterol Sulfate (PROAIR RESPICLICK) AEPB 2 puff (has no administration in time range)  Carboxymethylcell-Hypromellose 0.25-0.3 % GEL 1 drop (has no administration in time range)  enoxaparin (LOVENOX) injection 40 mg (has no administration in time range)  sodium chloride 0.9 % bolus 1,000 mL (0 mLs Intravenous Stopped 11/12/20 0710)    Followed by  sodium chloride 0.9 % bolus 1,000 mL (0 mLs Intravenous Stopped 11/12/20 0754)  metoCLOPramide (REGLAN) injection 10 mg (10 mg Intravenous Given 11/12/20 0436)  LORazepam (ATIVAN) injection 1 mg (1 mg Intravenous Given 11/12/20 0436)  labetalol (NORMODYNE) injection 10 mg (10 mg Intravenous Given 11/12/20 0812)  amLODipine (NORVASC) tablet 10 mg (10 mg Oral Given 11/12/20 0742)  carvedilol (COREG) tablet 12.5 mg (12.5 mg Oral Given 11/12/20 0742)  fentaNYL (SUBLIMAZE) injection 50 mcg (50 mcg Intravenous Given 11/12/20 0744)  hydrALAZINE (APRESOLINE) tablet 50 mg (50 mg Oral Given 11/12/20 0902)  morphine 4 MG/ML injection 5 mg (5 mg Intravenous Given 11/12/20 1212)  lactated ringers bolus 1,000 mL (1,000 mLs Intravenous Bolus 11/12/20 1212)  oxyCODONE-acetaminophen (PERCOCET/ROXICET) 5-325 MG per tablet 1 tablet (1 tablet Oral Given 11/12/20 1457)    And  oxyCODONE (Oxy IR/ROXICODONE) immediate release tablet 5 mg (5 mg Oral Given 11/12/20 1456)    Mobility manual wheelchair High fall risk   Focused Assessments    R Recommendations: See Admitting Provider Note  Report given to:   Additional Notes:

## 2020-11-12 NOTE — ED Provider Notes (Signed)
  Physical Exam  BP (!) 169/99   Pulse 98   Temp 98.4 F (36.9 C) (Oral)   Resp 16   Ht 5' 7"  (1.702 m)   Wt 68 kg   SpO2 93%   BMI 23.49 kg/m   Physical Exam Vitals and nursing note reviewed.  Constitutional:      General: She is not in acute distress.    Appearance: She is well-developed. She is ill-appearing.  HENT:     Head: Normocephalic and atraumatic.  Eyes:     Conjunctiva/sclera: Conjunctivae normal.  Cardiovascular:     Rate and Rhythm: Normal rate and regular rhythm.     Heart sounds: No murmur heard. Pulmonary:     Effort: Pulmonary effort is normal. No respiratory distress.     Breath sounds: Normal breath sounds.  Abdominal:     Palpations: Abdomen is soft.     Tenderness: There is abdominal tenderness in the epigastric area.  Musculoskeletal:     Cervical back: Neck supple.  Skin:    General: Skin is warm and dry.  Neurological:     Mental Status: She is alert.    ED Course/Procedures   Clinical Course as of 11/12/20 1602  Mon Nov 12, 2020  0746 N/v, gastroparesois and back pain, CT stone normal, pending K [MK]    Clinical Course User Index [MK] Mical Brun, MD    Procedures  MDM  Patient seen in the emergency department for evaluation of abdominal pain.  Patient was signed out to me by overnight physician and on my reevaluation, patient appears to be in significant abdominal pain with persistent vomiting.  Her laboratory evaluation was initially concerning for hyperkalemia, but repeat potassium shows potassium 3.1 with no intervention.  Suspect initial labs were hemolyzed.  Patient will require admission for inability to tolerate p.o. and likely worsening gastroparesis.  Patient then admitted.       Teressa Lower, MD 11/12/20 1610

## 2020-11-13 ENCOUNTER — Other Ambulatory Visit: Payer: Self-pay

## 2020-11-13 ENCOUNTER — Ambulatory Visit: Payer: Self-pay

## 2020-11-13 DIAGNOSIS — R112 Nausea with vomiting, unspecified: Secondary | ICD-10-CM | POA: Diagnosis not present

## 2020-11-13 DIAGNOSIS — K3184 Gastroparesis: Secondary | ICD-10-CM | POA: Diagnosis not present

## 2020-11-13 LAB — BASIC METABOLIC PANEL
Anion gap: 7 (ref 5–15)
BUN: 31 mg/dL — ABNORMAL HIGH (ref 6–20)
CO2: 25 mmol/L (ref 22–32)
Calcium: 7.8 mg/dL — ABNORMAL LOW (ref 8.9–10.3)
Chloride: 103 mmol/L (ref 98–111)
Creatinine, Ser: 1.37 mg/dL — ABNORMAL HIGH (ref 0.44–1.00)
GFR, Estimated: 53 mL/min — ABNORMAL LOW (ref >60)
Glucose, Bld: 423 mg/dL — ABNORMAL HIGH (ref 70–99)
Potassium: 3.2 mmol/L — ABNORMAL LOW (ref 3.5–5.1)
Sodium: 135 mmol/L (ref 135–145)

## 2020-11-13 LAB — GLUCOSE, CAPILLARY
Glucose-Capillary: 361 mg/dL — ABNORMAL HIGH (ref 70–99)
Glucose-Capillary: 357 mg/dL — ABNORMAL HIGH (ref 70–99)
Glucose-Capillary: 76 mg/dL (ref 70–99)

## 2020-11-13 LAB — HEMOGLOBIN A1C
Hgb A1c MFr Bld: 7.2 % — ABNORMAL HIGH (ref 4.8–5.6)
Mean Plasma Glucose: 159.94 mg/dL

## 2020-11-13 LAB — MAGNESIUM: Magnesium: 1.8 mg/dL (ref 1.7–2.4)

## 2020-11-13 MED ORDER — ALUM & MAG HYDROXIDE-SIMETH 200-200-20 MG/5ML PO SUSP
30.0000 mL | Freq: Four times a day (QID) | ORAL | Status: DC | PRN
  Administered 2020-11-13: 30 mL via ORAL
  Filled 2020-11-13: qty 30

## 2020-11-13 MED ORDER — METOCLOPRAMIDE HCL 5 MG/ML IJ SOLN
5.0000 mg | Freq: Three times a day (TID) | INTRAMUSCULAR | Status: DC
  Administered 2020-11-13: 5 mg via INTRAVENOUS
  Filled 2020-11-13: qty 2

## 2020-11-13 MED ORDER — INSULIN ASPART 100 UNIT/ML IJ SOLN
0.0000 [IU] | Freq: Three times a day (TID) | INTRAMUSCULAR | Status: DC
  Administered 2020-11-13: 9 [IU] via SUBCUTANEOUS

## 2020-11-13 MED ORDER — OXYCODONE-ACETAMINOPHEN 10-325 MG PO TABS: 1.0000 | ORAL_TABLET | ORAL | 0 refills | Status: DC | PRN

## 2020-11-13 MED ORDER — INSULIN ASPART 100 UNIT/ML IJ SOLN: 0.0000 [IU] | Freq: Every day | INTRAMUSCULAR | Status: DC

## 2020-11-13 MED ORDER — METOCLOPRAMIDE HCL 5 MG PO TABS: 5.0000 mg | ORAL_TABLET | Freq: Three times a day (TID) | ORAL | 2 refills | Status: AC

## 2020-11-13 NOTE — Telephone Encounter (Signed)
Rx was refills on 11/12/2020

## 2020-11-13 NOTE — Telephone Encounter (Signed)
.   Encourage patient to contact the pharmacy for refills or they can request refills through [place]:  Please schedule appointment if longer than 1 year  NEXT APPOINTMENT DATE:  MEDICATION:oxyCODONE-acetaminophen (PERCOCET) 10-325 MG tablet  Is the patient out of medication?   PHARMACY:[facility] [place], [place]

## 2020-11-13 NOTE — Consult Note (Signed)
[facility] Nurse Consult Note: Reason for Consult:Patient is s/p amputation of left great toe at [facility]  on 8/3.  Seen last week by my associate, Marlou Porch on 11/05/20. Wound type:surgical Pressure Injury POA: N/A Measurement:surgical site with wound edges in close approximation Wound bed:N/A Drainage (amount, consistency, odor) Scant serosanguinous Periwound: purple discoloration Dressing procedure/placement/frequency:I will provide Nursing with guidance for the care of this surgical site via the Orders; patient is to follow up with surgeon at the indicated interval. Nursing to cleanse the area with NS, and pat gently dry twice daily. Each cleanse will be followed by the application of a betadine swabstick and allowed to air dry. After drying, the wound will be dressed with a dry gauze dressing.  [facility] nursing team will not follow, but will remain available to this patient, the nursing and medical teams.  Please re-consult if needed. Thanks, Maudie Flakes, MSN, RN, [place], Arther Abbott  Pager# [phone]

## 2020-11-13 NOTE — Plan of Care (Signed)

## 2020-11-13 NOTE — Addendum Note (Signed)
Addended by: Betti Cruz on: 11/13/2020 12:22 PM   Modules accepted: Orders

## 2020-11-13 NOTE — Discharge Summary (Signed)
Physician Discharge Summary  [name] BPZ:[id number] DOB: 04-Aug-1990 DOA: 11/12/2020  PCP: Kathryn Barrack, MD  Admit date: 11/12/2020 Discharge date: 11/13/2020  Admitted From: Home  Disposition: Home  Recommendations for Outpatient Follow-up:  Follow up with PCP in 1-2 weeks Reviewed Reglan prescription Recommend outpatient follow-up with primary gastroenterologist for consideration of gastric stimulator for recurrent gastroparesis Continue IV antibiotics for left great toe osteomyelitis as prescribed by infectious disease, followed by [facility] ID clinic  Home Health: Home health RN Equipment/Devices: PICC line  Discharge Condition: Stable CODE STATUS: Full code Diet recommendation: Heart healthy/consistent carbohydrate diet  History of present illness:  [name] is a 30 year old female with past medical history significant for type 1 diabetes mellitus, diabetic gastroparesis, CKD stage IIIb, anxiety/depression, essential hypertension, cyclical vomiting syndrome, GERD, anemia of chronic disease, chronic narcotic dependence, 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  Patient reports that her gastroparesis recently has been poorly controlled with frequent flareups.  Patient went to see her GI doctor earlier this month who recommended gastric stimulator implantation.  Patient was unable to eat or drink.  Denies any urinary symptoms, no diarrhea.  In the ED, temperature 98.4 F, HR 116, RR 20, BP 190/122, SPO2 99% on room air.  Sodium 136, potassium 6.6, chloride 103, CO2 24, BUN 18, creatinine 1.33, glucose 116.  Magnesium 1.9, AST 42, ALT 8, lipase 21.  WBC 8.1, hemoglobin 8.8, platelets 2 8.  Urinalysis unrevealing.  Chest x-ray shows prior right lung pneumonia resolved, no evidence of acute cardiopulmonary disease process.  Renal stone study with no acute intra-abdominal process,  unchanged small ascites and diffuse anasarca.  Given patient's intractable nausea/vomiting, EDP consulted [facility] for further evaluation and admission secondary to poorly controlled gastroparesis.  Hospital course:  Diabetic gastroparesis, poorly controlled Patient presenting to the ED with nausea/vomiting, unable to tolerate any oral intake.  History of poorly controlled gastroparesis, followed by GI outpatient with recommendations of a gastric stimulator.  Patient also reports that she has run out of her home Reglan, which is likely a contributing factor.  Patient is afebrile without leukocytosis, unlikely infectious etiology.  CT renal stone study unrevealing.  Lipase within normal limits.  Patient was started on IV Reglan, antiemetics as needed and diet was slowly advanced with toleration.  Patient now feels back to her normal baseline and requesting renewal of her Reglan prescription.  She states she is ready for discharge home.  Discussed with patient needs close follow-up with her gastroenterologist due to her poorly controlled gastroparesis with recommendations of placement of a gastric stimulator which may help alleviate some of these recurrent admissions.  Type 1 diabetes mellitus, poorly controlled Hemoglobin A1c 9.0.  Follows with endocrinology outpatient, Dr. [name].  Continue Lantus 8 units subcutaneously daily with sliding scale insulin.  Outpatient follow-up with endocrinology.  Osteomyelitis left toe s/p amputation Hospitalized at [facility] from 10/26/2020 - 11/02/2020 for left hallux amputation.  Continue cefazolin to complete antibiotic course for osteomyelitis.  Outpatient follow-up with [facility] health infectious disease.  Essential hypertension Amlodipine 10 mg p.o. daily, Carvedilol 12.5 mg p.o. twice daily, Hydralazine 50 mg p.o. twice daily, Furosemide 40 mg p.o. daily   Anxiety/depression BuSpar 5 mg p.o. 3 times daily, Lorazepam 0.5 mg p.o. twice daily   GERD:  Protonix 40 mg p.o. twice daily   Diabetic gastroparesis Cyclical vomiting syndrome Reglan 5 mg p.o.  twice daily with meals, Bentyl 20 mg every 8 hours as needed spasms, Zofran as needed   CKD stage IIIb Baseline creatinine 1.22-2.6.  Creatinine 1.37 at time of discharge, at baseline.   Discharge Diagnoses:  Active Problems:   Gastroparesis   Non-intractable vomiting   Intractable nausea and vomiting    Discharge Instructions  Discharge Instructions     Call MD for:  difficulty breathing, headache or visual disturbances   Complete by: As directed    Call MD for:  extreme fatigue   Complete by: As directed    Call MD for:  persistant dizziness or light-headedness   Complete by: As directed    Call MD for:  persistant nausea and vomiting   Complete by: As directed    Call MD for:  severe uncontrolled pain   Complete by: As directed    Call MD for:  temperature >100.4   Complete by: As directed    Diet - low sodium heart healthy   Complete by: As directed    Discharge wound care:   Complete by: As directed    Wound care to left great toe amputation site:  Cleanse with NS, pat dry.  Paint with betadine swabstick, allow to air dry.  When dry, cover with dry gauze 2x2 inch gauze followed by folded 4x4 inch gauze and secure with Kerlix roll gauze dressing/paper tape.   Increase activity slowly   Complete by: As directed       Allergies as of 11/13/2020       Reactions   Other Anaphylaxis   Reaction to [place] nuts   Lactose Intolerance (gi) Diarrhea        Medication List     TAKE these medications    amLODipine 10 MG tablet Commonly known as: NORVASC Take 1 tablet (10 mg total) by mouth daily.   busPIRone 5 MG tablet Commonly known as: BUSPAR Take 1 tablet (5 mg total) by mouth 3 (three) times daily.   carvedilol 12.5 MG tablet Commonly known as: COREG Take 1 tablet (12.5 mg total) by mouth 2 (two) times daily with a meal.   ceFAZolin  IVPB Commonly  known as: ANCEF Inject 2 g into the vein every 8 (eight) hours for 20 days. Indication:  Wound infection  First Dose: Yes Last Day of Therapy:  12/01/20 Labs - Once weekly:  CBC/D and BMP, Labs - Every other week:  ESR and CRP Method of administration: IV Push Method of administration may be changed at the discretion of home infusion pharmacist based upon assessment of the patient and/or caregiver's ability to self-administer the medication ordered.   ceFEPIme 2 g in sodium chloride 0.9 % 100 mL Inject 2 g into the vein every 12 (twelve) hours. Start date : 11/06/20   cyclobenzaprine 10 MG tablet Commonly known as: FLEXERIL TAKE 1 TABLET(10 MG) BY MOUTH THREE TIMES DAILY AS NEEDED FOR MUSCLE SPASMS What changed: See the new instructions.   Dexcom G6 Sensor Misc USE AS DIRECTED EVERY 10 DAYS   Dexcom G6 Transmitter Misc USE AS DIRECTED TO  MONITOR  GLUCOSE  LEVELS   dicyclomine 20 MG tablet Commonly known as: BENTYL Take 1 tablet (20 mg total) by mouth every 8 (eight) hours as needed for spasms.   furosemide 40 MG tablet Commonly known as: Lasix Take 1 tablet (40 mg total) by mouth daily.   gabapentin 100 MG capsule Commonly known as: NEURONTIN Take 200 mg by mouth 3 (three) times daily.  GenTeal 0.25-0.3 % Gel Generic drug: Carboxymethylcell-Hypromellose Place 1 drop into both eyes daily as needed (dryeyes).   hydrALAZINE 50 MG tablet Commonly known as: APRESOLINE Take 50 mg by mouth 4 (four) times daily.   insulin glargine 100 UNIT/ML injection Commonly known as: LANTUS Inject 0.08 mLs (8 Units total) into the skin daily.   insulin lispro 100 UNIT/ML injection Commonly known as: HUMALOG Inject 0-5 Units into the skin 3 (three) times daily before meals. Sliding scale   loperamide 2 MG capsule Commonly known as: IMODIUM Take 6 mg by mouth daily as needed for diarrhea or loose stools.   LORazepam 0.5 MG tablet Commonly known as: ATIVAN TAKE 1 TABLET(0.5 MG) BY  MOUTH TWICE DAILY AS NEEDED FOR ANXIETY What changed: See the new instructions.   metoCLOPramide 5 MG tablet Commonly known as: REGLAN Take 1 tablet (5 mg total) by mouth 3 (three) times daily before meals. Please make and keep an appointment for further refills. What changed: when to take this   mirtazapine 30 MG tablet Commonly known as: REMERON TAKE 1 TABLET BY MOUTH AT  BEDTIME   ondansetron 4 MG disintegrating tablet Commonly known as: Zofran ODT Take 1 tablet (4 mg total) by mouth every 8 (eight) hours as needed for nausea or vomiting.   oxyCODONE 5 MG immediate release tablet Commonly known as: Roxicodone Take 1 tablet (5 mg total) by mouth 3 (three) times daily as needed for severe pain.   oxyCODONE-acetaminophen 10-325 MG tablet Commonly known as: PERCOCET Take 1 tablet by mouth every 4 (four) hours as needed for pain.   pantoprazole 40 MG tablet Commonly known as: PROTONIX Take 1 tablet (40 mg total) by mouth 2 (two) times daily.   PenTips 32G X 4 MM Misc Generic drug: Insulin Pen Needle USE AS DIRECTED WITH INSULIN PENS       ASK your doctor about these medications    Albuterol Sulfate 108 (90 Base) MCG/ACT Aepb Commonly known as: PROAIR RESPICLICK Inhale 2 puffs into the lungs daily as needed.   EPINEPHrine 0.3 mg/0.3 mL Soaj injection Commonly known as: EPI-PEN Inject 0.3 mg into the muscle once as needed (anaphylaxis/allergic reaction).   furosemide 40 MG tablet Commonly known as: LASIX TAKE 1 TABLET (40 MG TOTAL) BY MOUTH DAILY.               Discharge Care Instructions  (From admission, onward)           Start     Ordered   11/13/20 0000  Discharge wound care:       Comments: Wound care to left great toe amputation site:  Cleanse with NS, pat dry.  Paint with betadine swabstick, allow to air dry.  When dry, cover with dry gauze 2x2 inch gauze followed by folded 4x4 inch gauze and secure with Kerlix roll gauze dressing/paper tape.    11/13/20 0958            Follow-up Information     Kathryn Barrack, MD. Schedule an appointment as soon as possible for a visit in 1 week(s).   Specialty: Family Medicine Contact information: [place] [address] [phone]                Allergies  Allergen Reactions   Other Anaphylaxis    Reaction to [place] nuts    Lactose Intolerance (Gi) Diarrhea    Consultations: None   Procedures/Studies: DG Chest 2 View  Result Date: 11/12/2020 CLINICAL DATA:  Abdominal/back pain EXAM: CHEST - 2 VIEW COMPARISON:  11/03/2020 FINDINGS: Prior right lung pneumonia has resolved. Left lung is clear. No pleural effusion or pneumothorax. The heart is normal in size. Right arm PICC terminates at the cavoatrial junction. IMPRESSION: Prior right lung pneumonia has resolved. No evidence of acute cardiopulmonary disease. Electronically Signed   By: Julian Hy M.D.   On: 11/12/2020 03:03   DG Chest Port 1 View  Result Date: 11/03/2020 CLINICAL DATA:  30 year old female with sepsis. Found unresponsive by husband. EXAM: PORTABLE CHEST 1 VIEW COMPARISON:  Chest x-ray 09/25/2020. FINDINGS: There is a right upper extremity PICC with tip terminating in the right atrium. Patchy multifocal airspace consolidation throughout the right lung, most confluent in the right upper lobe. Left lung appears clear. No pleural effusions. No pneumothorax. No evidence of pulmonary edema. Heart size is normal. Upper mediastinal contours are within normal limits. IMPRESSION: 1. Multilobar pneumonia in the right lung. 2. Tip of right upper extremity PICC is in the right atrium. Electronically Signed   By: Vinnie Langton M.D.   On: 11/03/2020 11:21   DG Foot Complete Left  Result Date: 11/03/2020 CLINICAL DATA:  Recent amputation, concern for osteomyelitis. The patient is hypoglycemic. EXAM: LEFT FOOT - COMPLETE 3+ VIEW COMPARISON:  Left foot radiograph dated 10/31/2020. FINDINGS: The patient is  status post a first ray resection at the metatarsal phalangeal joint. There is no evidence of acute fracture or dislocation. No focal osseous demineralization to suggest osteomyelitis. Degenerative changes involving the navicular bone are redemonstrated. Soft tissues are unremarkable. IMPRESSION: No evidence of osteomyelitis. Electronically Signed   By: Zerita Boers M.D.   On: 11/03/2020 11:22   CT RENAL STONE STUDY  Result Date: 11/12/2020 CLINICAL DATA:  Abdominal pain. EXAM: CT ABDOMEN AND PELVIS WITHOUT CONTRAST TECHNIQUE: Multidetector CT imaging of the abdomen and pelvis was performed following the standard protocol without IV contrast. COMPARISON:  CT abdomen pelvis dated September 25, 2020. FINDINGS: Lower chest: No acute abnormality. Hepatobiliary: No focal liver abnormality is seen. Status post cholecystectomy. No biliary dilatation. Pancreas: Unremarkable. No pancreatic ductal dilatation or surrounding inflammatory changes. Spleen: Normal in size without focal abnormality. Adrenals/Urinary Tract: Adrenal glands are unremarkable. Kidneys are normal, without renal calculi, focal lesion, or hydronephrosis. Unchanged mild circumferential bladder wall thickening. Stomach/Bowel: Stomach is within normal limits. Appendix appears normal. No evidence of bowel wall thickening, distention, or inflammatory changes. Vascular/Lymphatic: No significant vascular findings are present. No enlarged abdominal or pelvic lymph nodes. Reproductive: Uterus and bilateral adnexa are unremarkable. Other: Unchanged small ascites.  No pneumoperitoneum. Musculoskeletal: No acute or significant osseous findings. New 1.7 cm intermediate density in the right lateral abdominal may be injection related. Unchanged diffuse anasarca. IMPRESSION: 1. No acute intra-abdominal process. 2. Unchanged small ascites and diffuse anasarca. Electronically Signed   By: Titus Dubin M.D.   On: 11/12/2020 05:45   XR Tibia/Fibula Right  Result Date:  10/31/2020 Radiographs of right tibia and fibula are negative for fracture. Transtibial amputation.    Subjective: Patient seen examined bedside, resting comfortably.  Tolerating diet.  No further nausea/vomiting.  States ready for discharge home.  Requesting renewal of her Reglan prescription.  No other complaints or concerns at this time.  Denies headache, no visual changes, no chest pain, palpitations, no fever/chills/night sweats, no nausea/vomiting/diarrhea, no abdominal pain, no weakness, no fatigue, no paresthesias.  No acute events overnight per nursing staff.  Discharge Exam: Vitals:   11/13/20 0411 11/13/20 0807  BP: (!) 142/78 Marland Kitchen)  158/90  Pulse: 91 90  Resp: 20 19  Temp: 99.7 F (37.6 C) 98.7 F (37.1 C)  SpO2: 99% 96%   Vitals:   11/12/20 2203 11/13/20 0010 11/13/20 0411 11/13/20 0807  BP: (!) 163/96 138/81 (!) 142/78 (!) 158/90  Pulse: (!) 104 85 91 90  Resp:  _0 Temp:  98.4 F (36.9 C) 99.7 F (37.6 C) 98.7 F (37.1 C)  TempSrc:    Oral  SpO2: 97% 100% 99% 96%  Weight:      Height:        General: Pt is alert, awake, not in acute distress, appears older than stated age Cardiovascular: RRR, S1/S2 +, no rubs, no gallops Respiratory: CTA bilaterally, no wheezing, no rhonchi, on room air Abdominal: Soft, NT, ND, bowel sounds + Extremities: Right BKA noted, left great toe amputation noted with dressing in place, no lower extremity edema, moves all extremities independently.    The results of significant diagnostics from this hospitalization (including imaging, microbiology, ancillary and laboratory) are listed below for reference.     Microbiology: Recent Results (from the past 240 hour(s))  Resp Panel by RT-PCR (Flu A&B, Covid) Nasopharyngeal Swab     Status: None   Collection Time: 11/03/20 10:41 AM   Specimen: Nasopharyngeal Swab; Nasopharyngeal(NP) swabs in vial transport medium  Result Value Ref Range Status   SARS Coronavirus 2 by RT PCR NEGATIVE  NEGATIVE Final    Comment: (NOTE) SARS-CoV-2 target nucleic acids are NOT DETECTED.  The SARS-CoV-2 RNA is generally detectable in upper respiratory specimens during the acute phase of infection. The lowest concentration of SARS-CoV-2 viral copies this assay can detect is 138 copies/mL. A negative result does not preclude SARS-Cov-2 infection and should not be used as the sole basis for treatment or other patient management decisions. A negative result may occur with  improper specimen collection/handling, submission of specimen other than nasopharyngeal swab, presence of viral mutation(s) within the areas targeted by this assay, and inadequate number of viral copies(<138 copies/mL). A negative result must be combined with clinical observations, patient history, and epidemiological information. The expected result is Negative.  Fact Sheet for Patients:  EntrepreneurPulse.com.au  Fact Sheet for Healthcare Providers:  IncredibleEmployment.be  This test is no t yet approved or cleared by the [place] FDA and  has been authorized for detection and/or diagnosis of SARS-CoV-2 by FDA under an Emergency Use Authorization (EUA). This EUA will remain  in effect (meaning this test can be used) for the duration of the COVID-19 declaration under Section 564(b)(1) of the Act, 21 U.S.C.section 360bbb-3(b)(1), unless the authorization is terminated  or revoked sooner.       Influenza A by PCR NEGATIVE NEGATIVE Final   Influenza B by PCR NEGATIVE NEGATIVE Final    Comment: (NOTE) The Xpert Xpress SARS-CoV-2/FLU/RSV plus assay is intended as an aid in the diagnosis of influenza from Nasopharyngeal swab specimens and should not be used as a sole basis for treatment. Nasal washings and aspirates are unacceptable for Xpert Xpress SARS-CoV-2/FLU/RSV testing.  Fact Sheet for Patients: EntrepreneurPulse.com.au  Fact Sheet for Healthcare  Providers: IncredibleEmployment.be  This test is not yet approved or cleared by the [place] FDA and has been authorized for detection and/or diagnosis of SARS-CoV-2 by FDA under an Emergency Use Authorization (EUA). This EUA will remain in effect (meaning this test can be used) for the duration of the COVID-19 declaration under Section 564(b)(1) of the Act, 21 U.S.C. section 360bbb-3(b)(1), unless the  authorization is terminated or revoked.  Performed at [facility], [place] [address], [place], [place] [address]   Urine Culture     Status: None   Collection Time: 11/03/20 10:41 AM   Specimen: In/Out Cath Urine  Result Value Ref Range Status   Specimen Description IN/OUT CATH URINE  Final   Special Requests NONE  Final   Culture   Final    NO GROWTH Performed at [facility], [place] [address] [place], [place] [address]    Report Status 11/04/2020 FINAL  Final  Blood Culture (routine x 2)     Status: None   Collection Time: 11/03/20  1:24 PM   Specimen: BLOOD  Result Value Ref Range Status   Specimen Description BLOOD LEFT ANTECUBITAL  Final   Special Requests   Final    BOTTLES DRAWN AEROBIC AND ANAEROBIC Blood Culture adequate volume   Culture   Final    NO GROWTH 5 DAYS Performed at [facility], [place] [address], [place], [place] [address]    Report Status 11/08/2020 FINAL  Final  MRSA Next Gen by PCR, Nasal     Status: None   Collection Time: 11/04/20 11:32 AM   Specimen: Nasal Mucosa; Nasal Swab  Result Value Ref Range Status   MRSA by PCR Next Gen NOT DETECTED NOT DETECTED Final    Comment: (NOTE) The GeneXpert MRSA Assay (FDA approved for NASAL specimens only), is one component of a comprehensive MRSA colonization surveillance program. It is not intended to diagnose MRSA infection nor to guide or monitor treatment for MRSA infections. Test performance is not FDA approved in patients less than 37 years old. Performed at [facility], [place] [address], [place], [place] [address]   Resp Panel by RT-PCR (Flu A&B, Covid) Nasopharyngeal Swab     Status: None   Collection Time: 11/12/20  2:30 PM   Specimen: Nasopharyngeal Swab; Nasopharyngeal(NP) swabs in vial transport medium  Result Value Ref Range Status   SARS Coronavirus 2 by RT PCR NEGATIVE NEGATIVE Final    Comment: (NOTE) SARS-CoV-2 target nucleic acids are NOT DETECTED.  The SARS-CoV-2 RNA is generally detectable in upper respiratory specimens during the acute phase of infection. The lowest concentration of SARS-CoV-2 viral copies this assay can detect is 138 copies/mL. A negative result does not preclude SARS-Cov-2 infection and should not be used as the sole basis for treatment or other patient management decisions. A negative result may occur with  improper specimen collection/handling, submission of specimen other than nasopharyngeal swab, presence of viral mutation(s) within the areas targeted by this assay, and inadequate number of viral copies(<138 copies/mL). A negative result must be combined with clinical observations, patient history, and epidemiological information. The expected result is Negative.  Fact Sheet for Patients:  EntrepreneurPulse.com.au  Fact Sheet for Healthcare Providers:  IncredibleEmployment.be  This test is no t yet approved or cleared by the [place] FDA and  has been authorized for detection and/or diagnosis of SARS-CoV-2 by FDA under an Emergency Use Authorization (EUA). This EUA will remain  in effect (meaning this test can be used) for the duration of the COVID-19 declaration under Section 564(b)(1) of the Act, 21 U.S.C.section 360bbb-3(b)(1), unless the authorization is terminated  or revoked sooner.       Influenza A by PCR NEGATIVE NEGATIVE Final   Influenza B by PCR NEGATIVE NEGATIVE Final    Comment: (NOTE) The Xpert Xpress SARS-CoV-2/FLU/RSV plus assay is  intended as an aid in the diagnosis of influenza from Nasopharyngeal swab specimens and should not be used as a sole basis for treatment. Nasal washings and aspirates are unacceptable for Xpert Xpress SARS-CoV-2/FLU/RSV testing.  Fact Sheet for Patients: EntrepreneurPulse.com.au  Fact Sheet for Healthcare Providers: IncredibleEmployment.be  This test is not yet approved or cleared by the [place] FDA and has been authorized for detection and/or diagnosis of SARS-CoV-2 by FDA under an Emergency Use Authorization (EUA). This EUA will remain in effect (meaning this test can be used) for the duration of the COVID-19 declaration under Section 564(b)(1) of the Act, 21 U.S.C. section 360bbb-3(b)(1), unless the authorization is terminated or revoked.  Performed at [facility], [place] [address], [place], [place] [address]      Labs: BNP (last 3 results) Recent Labs    05/12/20 0041 05/16/20 1515 06/04/20 0420  BNP 399.9* 757.2* 989.2*   Basic Metabolic Panel: Recent Labs  Lab 11/12/20 0407 11/12/20 0619 11/12/20 2106 11/13/20 0355  NA 136  --  137 135  K 6.6* 3.1* 3.4* 3.2*  CL 103  --  105 103  CO2 24  --  24 25  GLUCOSE 116*  --  269* 423*  BUN 18  --  25* 31*  CREATININE 1.33*  --  1.11* 1.37*  CALCIUM 10.2  --  8.2* 7.8*  MG 1.9  --   --  1.8   Liver Function Tests: Recent Labs  Lab 11/12/20 0407 11/12/20 2106  AST 42* 26  ALT 8 31  ALKPHOS 45 83  BILITOT 1.4* 0.3  PROT 8.0 7.2  ALBUMIN 5.1* 2.0*   Recent Labs  Lab 11/12/20 0407  LIPASE 21   No results for input(s): AMMONIA in the last 168 hours. CBC: Recent Labs  Lab 11/12/20 0407  WBC 8.1  NEUTROABS 6.3  HGB 8.8*  HCT 27.0*  MCV 92.8  PLT 208   Cardiac Enzymes: No results for input(s): CKTOTAL, CKMB, CKMBINDEX, TROPONINI in the last 168 hours. BNP: Invalid input(s): POCBNP CBG: Recent Labs  Lab 11/12/20 0243 11/12/20 2047  11/13/20 0627 11/13/20 0802  GLUCAP 134* 204* 361* 357*   D-Dimer No results for input(s): DDIMER in the last 72 hours. Hgb A1c No results for input(s): HGBA1C in the last 72 hours. Lipid Profile No results for input(s): CHOL, HDL, LDLCALC, TRIG, CHOLHDL, LDLDIRECT in the last 72 hours. Thyroid function studies No results for input(s): TSH, T4TOTAL, T3FREE, THYROIDAB in the last 72 hours.  Invalid input(s): FREET3 Anemia work up No results for input(s): VITAMINB12, FOLATE, FERRITIN, TIBC, IRON, RETICCTPCT in the last 72 hours. Urinalysis    Component Value Date/Time   COLORURINE YELLOW 11/12/2020 0322   APPEARANCEUR CLEAR 11/12/2020 0322   LABSPEC 1.016 11/12/2020 0322   PHURINE 6.0 11/12/2020 0322   GLUCOSEU 50 (A) 11/12/2020 0322   HGBUR SMALL (A) 11/12/2020 0322   BILIRUBINUR NEGATIVE 11/12/2020 0322   BILIRUBINUR negative 01/02/2015 1717   KETONESUR NEGATIVE 11/12/2020 0322   PROTEINUR >=300 (A) 11/12/2020 0322   UROBILINOGEN 0.2 01/13/2015 0223   NITRITE NEGATIVE 11/12/2020 0322   LEUKOCYTESUR NEGATIVE 11/12/2020 0322   Sepsis Labs Invalid input(s): PROCALCITONIN,  WBC,  LACTICIDVEN Microbiology Recent Results (from the past 240 hour(s))  Resp Panel by RT-PCR (Flu A&B, Covid) Nasopharyngeal Swab     Status: None   Collection Time: 11/03/20 10:41 AM   Specimen: Nasopharyngeal Swab; Nasopharyngeal(NP) swabs in vial transport medium  Result Value Ref Range Status  SARS Coronavirus 2 by RT PCR NEGATIVE NEGATIVE Final    Comment: (NOTE) SARS-CoV-2 target nucleic acids are NOT DETECTED.  The SARS-CoV-2 RNA is generally detectable in upper respiratory specimens during the acute phase of infection. The lowest concentration of SARS-CoV-2 viral copies this assay can detect is 138 copies/mL. A negative result does not preclude SARS-Cov-2 infection and should not be used as the sole basis for treatment or other patient management decisions. A negative result may occur  with  improper specimen collection/handling, submission of specimen other than nasopharyngeal swab, presence of viral mutation(s) within the areas targeted by this assay, and inadequate number of viral copies(<138 copies/mL). A negative result must be combined with clinical observations, patient history, and epidemiological information. The expected result is Negative.  Fact Sheet for Patients:  EntrepreneurPulse.com.au  Fact Sheet for Healthcare Providers:  IncredibleEmployment.be  This test is no t yet approved or cleared by the [place] FDA and  has been authorized for detection and/or diagnosis of SARS-CoV-2 by FDA under an Emergency Use Authorization (EUA). This EUA will remain  in effect (meaning this test can be used) for the duration of the COVID-19 declaration under Section 564(b)(1) of the Act, 21 U.S.C.section 360bbb-3(b)(1), unless the authorization is terminated  or revoked sooner.       Influenza A by PCR NEGATIVE NEGATIVE Final   Influenza B by PCR NEGATIVE NEGATIVE Final    Comment: (NOTE) The Xpert Xpress SARS-CoV-2/FLU/RSV plus assay is intended as an aid in the diagnosis of influenza from Nasopharyngeal swab specimens and should not be used as a sole basis for treatment. Nasal washings and aspirates are unacceptable for Xpert Xpress SARS-CoV-2/FLU/RSV testing.  Fact Sheet for Patients: EntrepreneurPulse.com.au  Fact Sheet for Healthcare Providers: IncredibleEmployment.be  This test is not yet approved or cleared by the [place] FDA and has been authorized for detection and/or diagnosis of SARS-CoV-2 by FDA under an Emergency Use Authorization (EUA). This EUA will remain in effect (meaning this test can be used) for the duration of the COVID-19 declaration under Section 564(b)(1) of the Act, 21 U.S.C. section 360bbb-3(b)(1), unless the authorization is terminated  or revoked.  Performed at [facility], [place] [address], [place], [place] [address]   Urine Culture     Status: None   Collection Time: 11/03/20 10:41 AM   Specimen: In/Out Cath Urine  Result Value Ref Range Status   Specimen Description IN/OUT CATH URINE  Final   Special Requests NONE  Final   Culture   Final    NO GROWTH Performed at [facility], [place] [address], [place], [place] [address]    Report Status 11/04/2020 FINAL  Final  Blood Culture (routine x 2)     Status: None   Collection Time: 11/03/20  1:24 PM   Specimen: BLOOD  Result Value Ref Range Status   Specimen Description BLOOD LEFT ANTECUBITAL  Final   Special Requests   Final    BOTTLES DRAWN AEROBIC AND ANAEROBIC Blood Culture adequate volume   Culture   Final    NO GROWTH 5 DAYS Performed at [facility], [place] [address], [place],  [address]    Report Status 11/08/2020 FINAL  Final  MRSA Next Gen by PCR, Nasal     Status: None   Collection Time: 11/04/20 11:32 AM   Specimen: Nasal Mucosa; Nasal Swab  Result Value Ref Range Status   MRSA by PCR Next Gen NOT DETECTED NOT DETECTED Final  Comment: (NOTE) The GeneXpert MRSA Assay (FDA approved for NASAL specimens only), is one component of a comprehensive MRSA colonization surveillance program. It is not intended to diagnose MRSA infection nor to guide or monitor treatment for MRSA infections. Test performance is not FDA approved in patients less than 56 years old. Performed at [facility], [place] [address], [place], [place] [address]   Resp Panel by RT-PCR (Flu A&B, Covid) Nasopharyngeal Swab     Status: None   Collection Time: 11/12/20  2:30 PM   Specimen: Nasopharyngeal Swab; Nasopharyngeal(NP) swabs in vial transport medium  Result Value Ref Range Status   SARS Coronavirus 2 by RT PCR NEGATIVE NEGATIVE Final    Comment: (NOTE) SARS-CoV-2 target nucleic acids are NOT DETECTED.  The SARS-CoV-2 RNA is generally detectable in  upper respiratory specimens during the acute phase of infection. The lowest concentration of SARS-CoV-2 viral copies this assay can detect is 138 copies/mL. A negative result does not preclude SARS-Cov-2 infection and should not be used as the sole basis for treatment or other patient management decisions. A negative result may occur with  improper specimen collection/handling, submission of specimen other than nasopharyngeal swab, presence of viral mutation(s) within the areas targeted by this assay, and inadequate number of viral copies(<138 copies/mL). A negative result must be combined with clinical observations, patient history, and epidemiological information. The expected result is Negative.  Fact Sheet for Patients:  EntrepreneurPulse.com.au  Fact Sheet for Healthcare Providers:  IncredibleEmployment.be  This test is no t yet approved or cleared by the [place] FDA and  has been authorized for detection and/or diagnosis of SARS-CoV-2 by FDA under an Emergency Use Authorization (EUA). This EUA will remain  in effect (meaning this test can be used) for the duration of the COVID-19 declaration under Section 564(b)(1) of the Act, 21 U.S.C.section 360bbb-3(b)(1), unless the authorization is terminated  or revoked sooner.       Influenza A by PCR NEGATIVE NEGATIVE Final   Influenza B by PCR NEGATIVE NEGATIVE Final    Comment: (NOTE) The Xpert Xpress SARS-CoV-2/FLU/RSV plus assay is intended as an aid in the diagnosis of influenza from Nasopharyngeal swab specimens and should not be used as a sole basis for treatment. Nasal washings and aspirates are unacceptable for Xpert Xpress SARS-CoV-2/FLU/RSV testing.  Fact Sheet for Patients: EntrepreneurPulse.com.au  Fact Sheet for Healthcare Providers: IncredibleEmployment.be  This test is not yet approved or cleared by the [place] FDA and has been  authorized for detection and/or diagnosis of SARS-CoV-2 by FDA under an Emergency Use Authorization (EUA). This EUA will remain in effect (meaning this test can be used) for the duration of the COVID-19 declaration under Section 564(b)(1) of the Act, 21 U.S.C. section 360bbb-3(b)(1), unless the authorization is terminated or revoked.  Performed at [facility], [place] [address] [place], [place] [address]      Time coordinating discharge: Over 30 minutes  SIGNED:   Lilygrace Rodick J [place] DO  [facility] 11/13/2020, 9:59 AM

## 2020-11-14 ENCOUNTER — Telehealth: Payer: Self-pay

## 2020-11-14 DIAGNOSIS — E1069 Type 1 diabetes mellitus with other specified complication: Secondary | ICD-10-CM | POA: Diagnosis not present

## 2020-11-14 DIAGNOSIS — N1831 Chronic kidney disease, stage 3a: Secondary | ICD-10-CM | POA: Diagnosis not present

## 2020-11-14 DIAGNOSIS — Z452 Encounter for adjustment and management of vascular access device: Secondary | ICD-10-CM | POA: Diagnosis not present

## 2020-11-14 DIAGNOSIS — A491 Streptococcal infection, unspecified site: Secondary | ICD-10-CM | POA: Diagnosis not present

## 2020-11-14 DIAGNOSIS — Z792 Long term (current) use of antibiotics: Secondary | ICD-10-CM | POA: Diagnosis not present

## 2020-11-14 DIAGNOSIS — E1042 Type 1 diabetes mellitus with diabetic polyneuropathy: Secondary | ICD-10-CM | POA: Diagnosis not present

## 2020-11-14 DIAGNOSIS — M86172 Other acute osteomyelitis, left ankle and foot: Secondary | ICD-10-CM | POA: Diagnosis not present

## 2020-11-14 DIAGNOSIS — L089 Local infection of the skin and subcutaneous tissue, unspecified: Secondary | ICD-10-CM | POA: Diagnosis not present

## 2020-11-14 DIAGNOSIS — E1021 Type 1 diabetes mellitus with diabetic nephropathy: Secondary | ICD-10-CM | POA: Diagnosis not present

## 2020-11-14 DIAGNOSIS — A4901 Methicillin susceptible Staphylococcus aureus infection, unspecified site: Secondary | ICD-10-CM | POA: Diagnosis not present

## 2020-11-14 NOTE — Telephone Encounter (Signed)
Transition Care Management Follow-up Telephone Call Date of discharge and from where: 11/13/2020-[facility] How have you been since you were released from the hospital? Patient stated she is doing fine.  Any questions or concerns? No  Items Reviewed: Did the pt receive and understand the discharge instructions provided? Yes  Medications obtained and verified? Yes  Other? No  Any new allergies since your discharge? No  Dietary orders reviewed? N/A Do you have support at home? Yes   Home Care and Equipment/Supplies: Were home health services ordered? not applicable If so, what is the name of the agency? N/A  Has the agency set up a time to come to the patient's home? not applicable Were any new equipment or medical supplies ordered?  No What is the name of the medical supply agency? N/A Were you able to get the supplies/equipment? not applicable Do you have any questions related to the use of the equipment or supplies? No  Functional Questionnaire: (I = Independent and D = Dependent) ADLs: I  Bathing/Dressing- I  Meal Prep- I  Eating- I  Maintaining continence- I  Transferring/Ambulation- I  Managing Meds- I  Follow up appointments reviewed:  PCP Hospital f/u appt confirmed? Yes  Scheduled to see Dr. Jerline Pain on 11/15/2020 @ 9:20 am. [facility] f/u appt confirmed? No   Are transportation arrangements needed? No  If their condition worsens, is the pt aware to call PCP or go to the Emergency Dept.? Yes Was the patient provided with contact information for the PCP's office or ED? Yes Was to pt encouraged to call back with questions or concerns? Yes

## 2020-11-15 ENCOUNTER — Encounter: Payer: Self-pay | Admitting: Family Medicine

## 2020-11-15 ENCOUNTER — Other Ambulatory Visit: Payer: Self-pay

## 2020-11-15 ENCOUNTER — Ambulatory Visit (INDEPENDENT_AMBULATORY_CARE_PROVIDER_SITE_OTHER): Payer: Medicaid Other | Admitting: Family Medicine

## 2020-11-15 VITALS — BP 156/86 | HR 99 | Temp 98.3°F | Ht 67.0 in

## 2020-11-15 DIAGNOSIS — E1043 Type 1 diabetes mellitus with diabetic autonomic (poly)neuropathy: Secondary | ICD-10-CM | POA: Diagnosis not present

## 2020-11-15 DIAGNOSIS — I152 Hypertension secondary to endocrine disorders: Secondary | ICD-10-CM

## 2020-11-15 DIAGNOSIS — E1022 Type 1 diabetes mellitus with diabetic chronic kidney disease: Secondary | ICD-10-CM | POA: Diagnosis not present

## 2020-11-15 DIAGNOSIS — E1069 Type 1 diabetes mellitus with other specified complication: Secondary | ICD-10-CM | POA: Insufficient documentation

## 2020-11-15 DIAGNOSIS — G4700 Insomnia, unspecified: Secondary | ICD-10-CM | POA: Diagnosis not present

## 2020-11-15 DIAGNOSIS — S88111A Complete traumatic amputation at level between knee and ankle, right lower leg, initial encounter: Secondary | ICD-10-CM

## 2020-11-15 DIAGNOSIS — E1159 Type 2 diabetes mellitus with other circulatory complications: Secondary | ICD-10-CM

## 2020-11-15 DIAGNOSIS — F419 Anxiety disorder, unspecified: Secondary | ICD-10-CM

## 2020-11-15 DIAGNOSIS — K3184 Gastroparesis: Secondary | ICD-10-CM

## 2020-11-15 DIAGNOSIS — E1010 Type 1 diabetes mellitus with ketoacidosis without coma: Secondary | ICD-10-CM | POA: Insufficient documentation

## 2020-11-15 DIAGNOSIS — Z89412 Acquired absence of left great toe: Secondary | ICD-10-CM | POA: Diagnosis not present

## 2020-11-15 DIAGNOSIS — M86271 Subacute osteomyelitis, right ankle and foot: Secondary | ICD-10-CM

## 2020-11-15 MED ORDER — CYCLOBENZAPRINE HCL 10 MG PO TABS: 10.0000 mg | ORAL_TABLET | Freq: Three times a day (TID) | ORAL | 5 refills | Status: AC | PRN

## 2020-11-15 MED ORDER — TRAZODONE HCL 50 MG PO TABS: 25.0000 mg | ORAL_TABLET | Freq: Every evening | ORAL | 3 refills | Status: AC | PRN

## 2020-11-15 MED ORDER — LORAZEPAM 0.5 MG PO TABS: 0.5000 mg | ORAL_TABLET | Freq: Two times a day (BID) | ORAL | 5 refills | Status: AC

## 2020-11-15 NOTE — Assessment & Plan Note (Signed)
Above goal in setting of acute pain.  Usually well controlled.  Continue amlodipine 10 mg daily and hydralazine 50 mg 4 times daily.

## 2020-11-15 NOTE — Assessment & Plan Note (Signed)
She will get back into see her therapist.  We will continue current medication regimen with BuSpar 5 mg 3 times daily as needed and lorazepam 0.5 mg twice daily.

## 2020-11-15 NOTE — Patient Instructions (Signed)
It was very nice to see you today!  We will refill your Ativan and Flexeril.  Please try the trazodone to help with sleep.  You  can call your therapist at [phone].  Please send me a message in a few weeks let me know how you are doing with the trazodone.  I will see back in 3 to 6 months.  Come back to see me sooner if needed.    Take care, Dr Jerline Pain  PLEASE NOTE:  If you had any lab tests please let us know if you have not heard back within a few days. You may see your results on mychart before we have a chance to review them but we will give you a call once they are reviewed by [place]. If we ordered any referrals today, please let us know if you have not heard from their office within the next week.   Please try these tips to maintain a healthy lifestyle:  Eat at least 3 REAL meals and 1-2 snacks per day.  Aim for no more than 5 hours between eating.  If you eat breakfast, please do so within one hour of getting up.   Each meal should contain half fruits/vegetables, one quarter protein, and one quarter carbs (no bigger than a computer mouse)  Cut down on sweet beverages. This includes juice, soda, and sweet tea.   Drink at least 1 glass of water with each meal and aim for at least 8 glasses per day  Exercise at least 150 minutes every week.

## 2020-11-15 NOTE — Assessment & Plan Note (Signed)
Follows with endocrinology.  Has very labile blood sugars.  We will continue current regimen Lantus 8 units daily and sliding scale Humalog 0 to 5 units 3 times daily before meals.

## 2020-11-15 NOTE — Assessment & Plan Note (Signed)
Still not controlled.  PRimary reason for her labile blood sugar as well as her frequent hospitalizations.  She will be following up with gastroenterology soon.  Currently looking into having gastric pacemaker placed.  She will continue taking Reglan 3 times daily before meals.

## 2020-11-15 NOTE — Assessment & Plan Note (Signed)
Not controlled.  Discussed treatment options.  We will start trazodone 25 to 50 mg nightly and she will check in with me in a couple of weeks.

## 2020-11-15 NOTE — Progress Notes (Signed)
[name] is a 30 y.o. female who presents today for an office visit.  Assessment/Plan:  Chronic Problems Addressed Today: Hypertension associated with diabetes ([place]) Above goal in setting of acute pain.  Usually well controlled.  Continue amlodipine 10 mg daily and hydralazine 50 mg 4 times daily.  Insomnia Not controlled.  Discussed treatment options.  We will start trazodone 25 to 50 mg nightly and she will check in with me in a couple of weeks.  Type 1 diabetes mellitus with diabetic chronic kidney disease ([place]) Follows with endocrinology.  Has very labile blood sugars.  We will continue current regimen Lantus 8 units daily and sliding scale Humalog 0 to 5 units 3 times daily before meals.  Osteomyelitis ([place]) Following with ID and orthopedics.  On IV antibiotics for the next couple of weeks.  She is doing well.  Diabetic gastroparesis associated with type 1 diabetes mellitus ([place])   Still not controlled.  PRimary reason for her labile blood sugar as well as her frequent hospitalizations.  She will be following up with gastroenterology soon.  Currently looking into having gastric pacemaker placed.  She will continue taking Reglan 3 times daily before meals.  Anxiety She will get back into see her therapist.  We will continue current medication regimen with BuSpar 5 mg 3 times daily as needed and lorazepam 0.5 mg twice daily.     Subjective:  HPI:  She is here to follow up. She was last seen in the office on 11/08/2020 by different provider and was recently admitted to the hospital overnight few days ago for flareup of her gastroparesis.   She is here with her husband and daughter. She usually have nausea every day but it has been better since ED visit. She state she is doing well. She has had few flair of gastroparesis but overall doing well. Denies abdominal pain. No fevers or cough.  She recently went to ED. Admitted 11/12/2020. Discharged on 11/13/2020 from [facility] s/p gastroparesis, Non-intractable vomiting with nausea and hypertension.  In addition to this, she complains of hard time sleeping and states this has been going on for the past 6 months. She tried Melatonin before but it did not helped. She also concerned about her HTN.. She is scheduled  with Dr. [name], endocrinologist on 12/11/2020.  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         Objective:  Physical Exam: BP (!) 156/86   Pulse 99   Temp 98.3 F (36.8 C) (Temporal)   Ht 5' 7"  (1.702 m)   SpO2 99%   BMI 23.49 kg/m   Gen: No acute distress, resting comfortably CV: Regular rate and rhythm with no murmurs appreciated Pulm: Normal work of breathing, clear to auscultation bilaterally with no crackles, wheezes, or rhonchi Neuro: Grossly normal, moves all extremities Psych: Normal affect and thought content       I,Savera Zaman,acting as a scribe for Dimas Chyle, MD.,have documented all relevant documentation on the behalf of Dimas Chyle, MD,as directed by  Dimas Chyle, MD while in the presence of Dimas Chyle, MD.  I, Dimas Chyle, MD, have reviewed all documentation for this visit. The documentation on 11/15/20 for the exam, diagnosis, procedures, and orders are all accurate and complete.  Time Spent: 45 minutes of total time was spent  on the date of the encounter performing the following actions: chart review prior to seeing the patient, obtaining history including recent hospitalization, performing a medically necessary exam, counseling on the treatment plan, placing orders, and documenting in our EHR.    Algis Greenhouse. Jerline Pain, MD 11/15/2020 10:07 AM

## 2020-11-15 NOTE — Assessment & Plan Note (Signed)
Following with ID and orthopedics.  On IV antibiotics for the next couple of weeks.  She is doing well.

## 2020-11-19 ENCOUNTER — Emergency Department (HOSPITAL_COMMUNITY)
Admission: EM | Admit: 2020-11-19 | Discharge: 2020-11-19 | Disposition: A | Discharge status: Home / Self Care | Payer: Medicaid Other | Attending: Emergency Medicine | Admitting: Emergency Medicine

## 2020-11-19 ENCOUNTER — Emergency Department (HOSPITAL_COMMUNITY): Payer: Medicaid Other

## 2020-11-19 ENCOUNTER — Other Ambulatory Visit: Payer: Self-pay

## 2020-11-19 ENCOUNTER — Encounter (HOSPITAL_COMMUNITY): Payer: Self-pay

## 2020-11-19 DIAGNOSIS — N183 Chronic kidney disease, stage 3 unspecified: Secondary | ICD-10-CM | POA: Insufficient documentation

## 2020-11-19 DIAGNOSIS — E1022 Type 1 diabetes mellitus with diabetic chronic kidney disease: Secondary | ICD-10-CM | POA: Diagnosis not present

## 2020-11-19 DIAGNOSIS — R000 Tachycardia, unspecified: Secondary | ICD-10-CM | POA: Insufficient documentation

## 2020-11-19 DIAGNOSIS — Z79899 Other long term (current) drug therapy: Secondary | ICD-10-CM | POA: Insufficient documentation

## 2020-11-19 DIAGNOSIS — R220 Localized swelling, mass and lump, head: Secondary | ICD-10-CM | POA: Insufficient documentation

## 2020-11-19 DIAGNOSIS — I129 Hypertensive chronic kidney disease with stage 1 through stage 4 chronic kidney disease, or unspecified chronic kidney disease: Secondary | ICD-10-CM | POA: Insufficient documentation

## 2020-11-19 DIAGNOSIS — R0602 Shortness of breath: Secondary | ICD-10-CM | POA: Diagnosis not present

## 2020-11-19 DIAGNOSIS — Z8616 Personal history of COVID-19: Secondary | ICD-10-CM | POA: Insufficient documentation

## 2020-11-19 DIAGNOSIS — J189 Pneumonia, unspecified organism: Secondary | ICD-10-CM | POA: Insufficient documentation

## 2020-11-19 DIAGNOSIS — Z794 Long term (current) use of insulin: Secondary | ICD-10-CM | POA: Insufficient documentation

## 2020-11-19 DIAGNOSIS — J90 Pleural effusion, not elsewhere classified: Secondary | ICD-10-CM | POA: Diagnosis not present

## 2020-11-19 IMAGING — CR DG CHEST 2V
3 series · 3 of 3 positions shown · non-contrast
Comparison: Chest x-ray dated [DATE].

CLINICAL DATA: Shortness of breath.  Facial swelling.

EXAM:
CHEST - 2 VIEW

[w chest lat (1 of 2)]
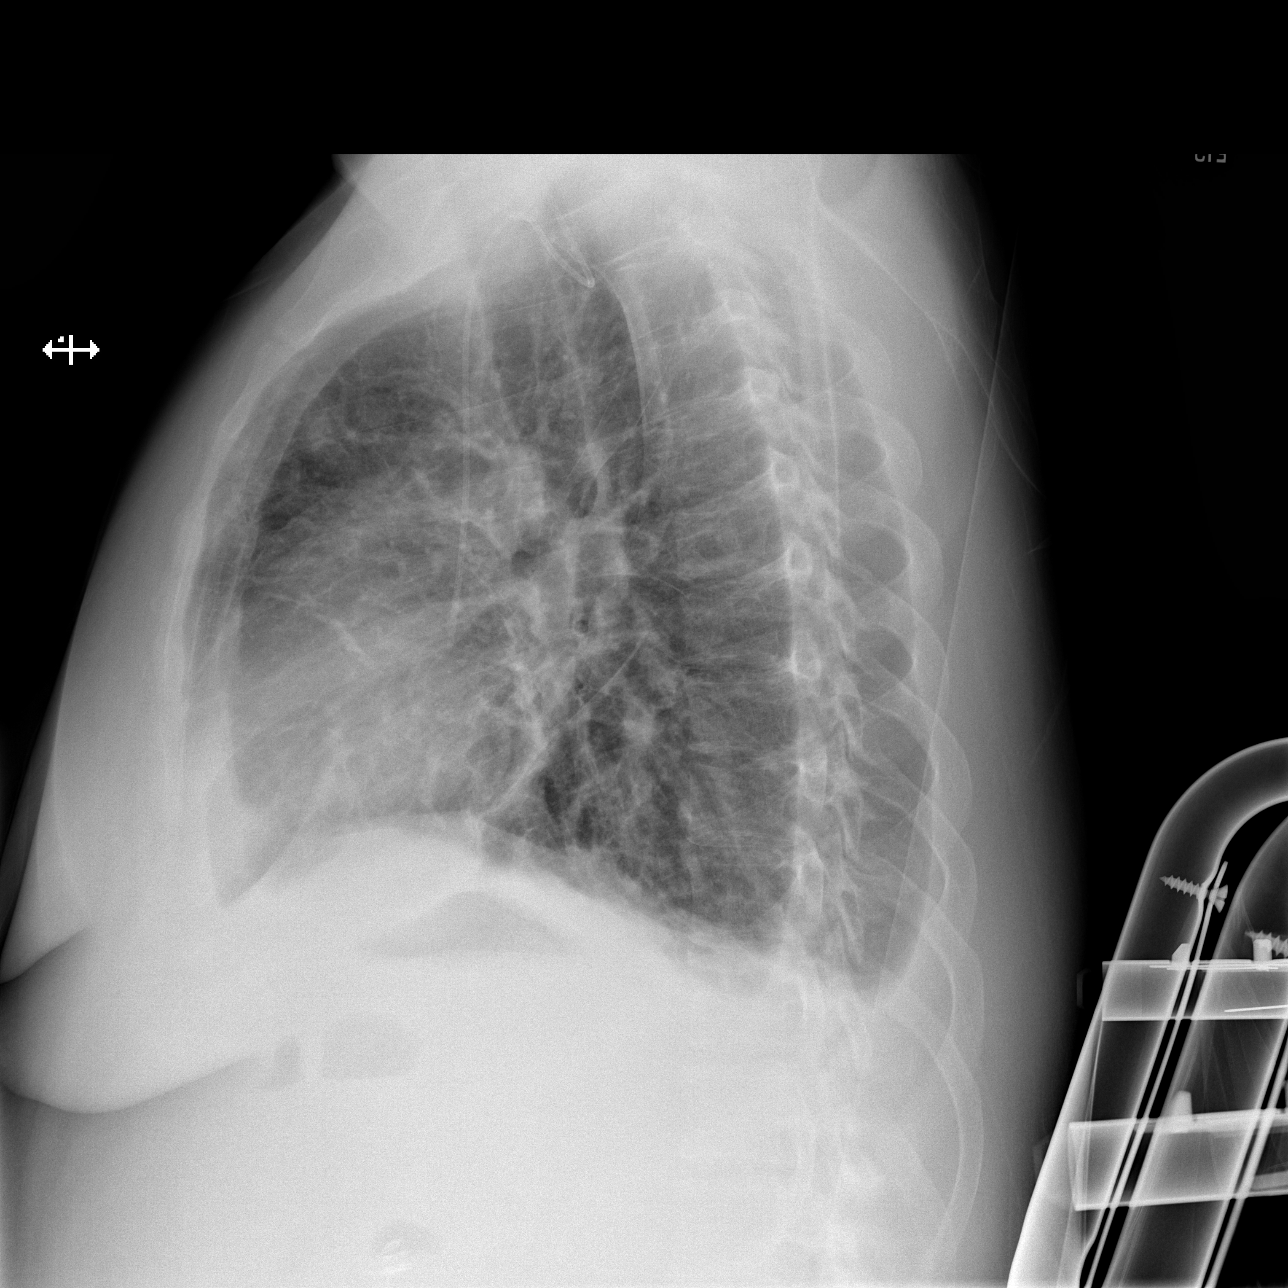

[w chest lat (2 of 2)]
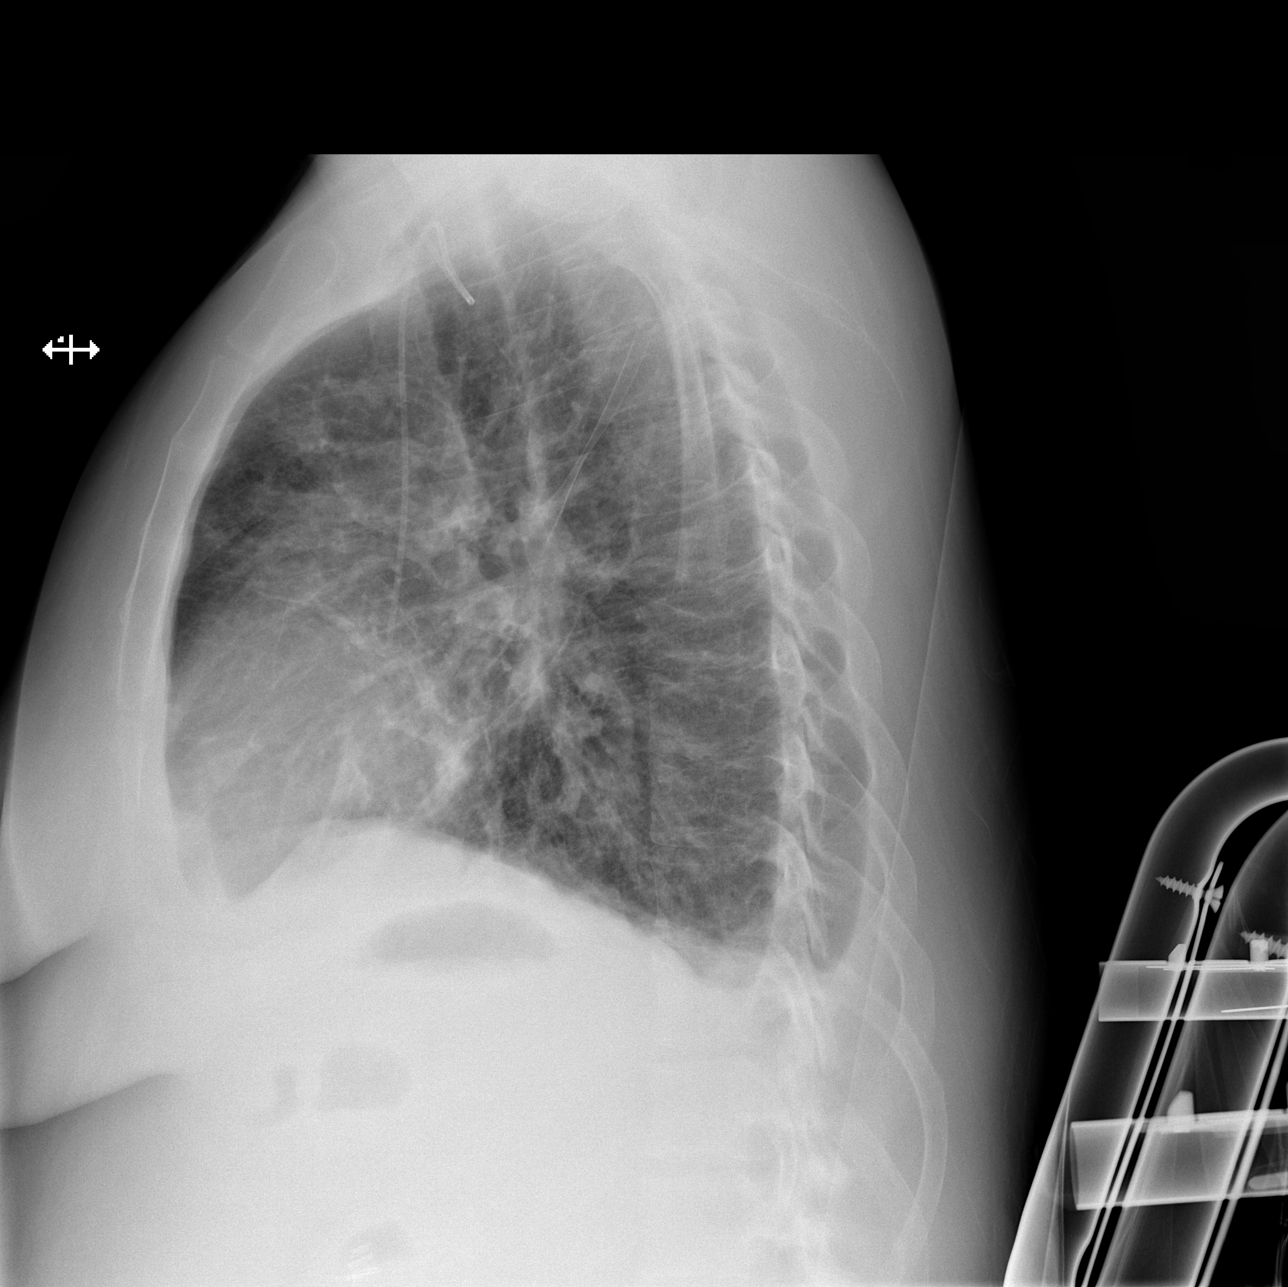

[x chest ap]
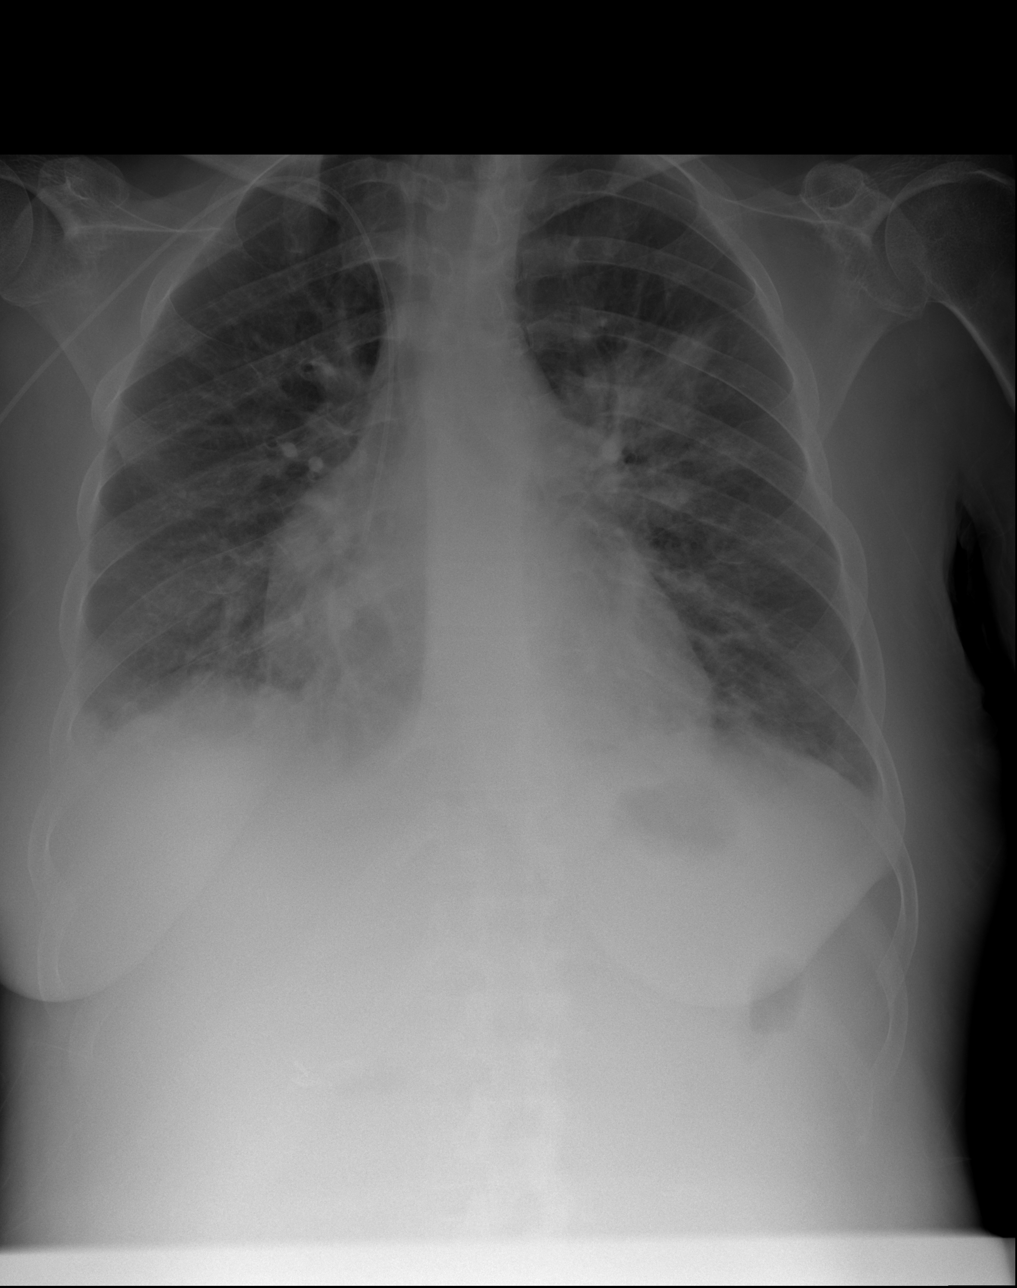

[3 of 3 positions shown; findings below may reference images not displayed]

FINDINGS: Unchanged right upper extremity PICC line. Normal heart size. New
trace bilateral pleural effusions. New left perihilar and bibasilar
opacities. No pneumothorax. No acute osseous abnormality.
IMPRESSION: 1. New left perihilar opacity concerning for pneumonia.
2. New trace bilateral pleural effusions with bibasilar atelectasis
versus infiltrate.

## 2020-11-19 MED ORDER — FAMOTIDINE 20 MG PO TABS
20.0000 mg | ORAL_TABLET | Freq: Once | ORAL | Status: AC
  Administered 2020-11-19: 20 mg via ORAL
  Filled 2020-11-19: qty 1

## 2020-11-19 MED ORDER — DIPHENHYDRAMINE HCL 25 MG PO CAPS
25.0000 mg | ORAL_CAPSULE | Freq: Once | ORAL | Status: AC
  Administered 2020-11-19: 25 mg via ORAL
  Filled 2020-11-19: qty 1

## 2020-11-19 MED ORDER — CEFTRIAXONE SODIUM 1 G IJ SOLR
1.0000 g | Freq: Once | INTRAMUSCULAR | Status: AC
  Administered 2020-11-19: 1 g via INTRAMUSCULAR
  Filled 2020-11-19: qty 10

## 2020-11-19 MED ORDER — AZITHROMYCIN 250 MG PO TABS: 250.0000 mg | ORAL_TABLET | Freq: Every day | ORAL | 0 refills | Status: DC

## 2020-11-19 NOTE — Discharge Instructions (Addendum)
Call your primary care doctor or specialist as discussed in the next 2-3 days.   Return immediately back to the ER if:  Your symptoms worsen within the next 12-24 hours. You develop new symptoms such as new fevers, persistent vomiting, new pain, shortness of breath, or new weakness or numbness, or if you have any other concerns.  

## 2020-11-19 NOTE — ED Provider Notes (Signed)
Emergency Medicine Provider Triage Evaluation Note  [name] , a 30 y.o. female  was evaluated in triage.  Pt complains of facial swelling and difficulty breathing.  Patient reports when she woke up this morning she noted that the left side of her face was swollen.  Denies any swelling of her lips or tongue.  Also reports this morning she had some increased difficulty breathing, was recently treated for pneumonia.  Complaining of left foot pain as well, recently had a toe amputation.  Patient reports she was just recently started on trazodone on Friday, no other new medications, foods or products.  No medications prior to arrival.  Review of Systems  Positive: Facial swelling, shortness of breath Negative: Tongue swelling, hives, chest pain  Physical Exam  BP (!) 167/96 (BP Location: Left Arm)   Pulse (!) 108   Temp 98.4 F (36.9 C) (Oral)   Resp 20   Ht 5' 7"  (1.702 m)   Wt 72.6 kg   SpO2 96%   BMI 25.06 kg/m  Gen:   Awake, patient is very anxious but in no acute distress Resp:  Tachypneic, but with coaching patient able to slow her breathing down MSK:   Moves extremities without difficulty  Other:  Mild swelling around the left eye, no swelling of the lips or tongue, posterior oropharynx clear, lungs clear  Medical Decision Making  Medically screening exam initiated at 3:12 PM.  Appropriate orders placed.  [name] was informed that the remainder of the evaluation will be completed by another provider, this initial triage assessment does not replace that evaluation, and the importance of remaining in the ED until their evaluation is complete.     Janet Berlin 11/19/20 1515    Luna Fuse, MD 11/19/20 [phone]

## 2020-11-19 NOTE — ED Triage Notes (Signed)
Patient c/o right facial swelling since 0700 today. Patient states trouble breathing, but is speaking in complete sentences and is able to swallow her own saliva.

## 2020-11-19 NOTE — ED Provider Notes (Signed)
[facility] DEPT Provider Note   CSN: [id number] Arrival date & time: 11/19/20  1429     History Chief Complaint  Patient presents with   Facial Swelling    [name] is a 30 y.o. female.  HeadachePatient with history of gastroparesis right lower extremity, presents with concern for swelling to the left eye lid.  She states symptoms started this morning when she woke up she noticed it.  Has not really changed since this morning.  She was seen in triage worsen medication was ordered.  Otherwise denies any fevers.  No vomiting or diarrhea no difficulty breathing no swelling to her lower face per patient.  Patient does states she had a cough about 3 to 4 days.      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   Insomnia 11/15/2020   Iron deficiency 04/11/2020   Osteomyelitis ([place]) 04/10/2020   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 ([place]) 02/03/2020   Cannabinoid hyperemesis syndrome 04/30/2019   Contraception management 08/06/2018   Diabetic gastroparesis associated with type 1 diabetes mellitus ([place]) 06/04/2018   Anxiety 05/31/2018   Peripheral edema 05/31/2018   Chronic anemia 07/10/2017   GERD (gastroesophageal reflux disease) 06/23/2017   MDD (major depressive disorder), recurrent episode, mild ([place]) 02/28/2017   Severe protein-calorie malnutrition ([place]) 04/28/2013   Hypertension associated with diabetes ([place]) 11/14/2011    Past Surgical History:  Procedure  Laterality Date   AMPUTATION Right 04/13/2020   Procedure: 1ST RAY AMPUTATION RIGHT FOOT;  Surgeon: Newt Minion, MD;  Location: [facility];  Service: Orthopedics;  Laterality: Right;   AMPUTATION Right 06/06/2020   Procedure: RIGHT BELOW KNEE AMPUTATION;  Surgeon: Newt Minion, MD;  Location: [facility];  Service: Orthopedics;  Laterality: Right;   AMPUTATION Left    great toe   ANKLE SURGERY     APPLICATION OF WOUND VAC Right 06/06/2020   Procedure: APPLICATION OF WOUND VAC;  Surgeon: Newt Minion, MD;  Location: [facility];  Service: Orthopedics;  Laterality: Right;   CHOLECYSTECTOMY  11/15/2011   Procedure: LAPAROSCOPIC CHOLECYSTECTOMY WITH INTRAOPERATIVE CHOLANGIOGRAM;  Surgeon: [name], MD;  [facility]: [facility];  Service: General;  Laterality: N/A;   COLONOSCOPY     COLONOSCOPY WITH PROPOFOL N/A 06/27/2017   Procedure: COLONOSCOPY WITH PROPOFOL;  Surgeon: Milus Banister, MD;  [facility];  Service: Endoscopy;  Laterality: N/A;   ESOPHAGOGASTRODUODENOSCOPY  12/03/2011   Procedure: ESOPHAGOGASTRODUODENOSCOPY (EGD);  Surgeon: Beryle Beams, MD;  Location: [facility];  Service: Endoscopy;  Laterality: N/A;   FLEXIBLE SIGMOIDOSCOPY N/A 03/10/2017   Procedure: FLEXIBLE SIGMOIDOSCOPY;  Surgeon: Carol Ada, MD;  [facility];  Service: Endoscopy;  Laterality: N/A;   INCISION AND DRAINAGE PERIRECTAL ABSCESS N/A 03/01/2017   Procedure: IRRIGATION AND DEBRIDEMENT PERIRECTAL ABSCESS;  Surgeon: Alphonsa Overall, MD;  [facility];  Service:  General;  Laterality: N/A;   IRRIGATION AND DEBRIDEMENT BUTTOCKS N/A 03/23/2017   Procedure: IRRIGATION AND DEBRIDEMENT BUTTOCKS, SETON PLACEMENT;  Surgeon: [name], MD;  [facility];  Service: General;  Laterality: N/A;   LAPAROSCOPY  11/23/2011   Procedure: LAPAROSCOPY DIAGNOSTIC;  Surgeon: Edward Jolly, MD;  [facility];  Service: General;  Laterality: N/A;   SIGMOIDOSCOPY     UPPER GASTROINTESTINAL ENDOSCOPY      WISDOM TOOTH EXTRACTION       OB History     Gravida  1   Para  1   Term      Preterm  1   AB      Living  1      SAB      IAB      Ectopic      Multiple  0   Live Births  1           Family History  Problem Relation Age of Onset   Diabetes Mother    Hypertension Father    Colon cancer Paternal Grandmother        pt thinks PGM was dx in her 47's   Diabetes Paternal Grandmother    Diabetes Maternal Grandmother    Diabetes Maternal Grandfather    Diabetes Paternal Grandfather    Diabetes Other    Breast cancer Paternal Aunt    Esophageal cancer Neg Hx    Liver cancer Neg Hx    Pancreatic cancer Neg Hx    Stomach cancer Neg Hx    Rectal cancer Neg Hx     Social History   Tobacco Use   Smoking status: Never   Smokeless tobacco: Never  Vaping Use   Vaping Use: Never used  Substance Use Topics   Alcohol use: No    Alcohol/week: 0.0 standard drinks   Drug use: Not Currently    Types: Marijuana    Home Medications Prior to Admission medications   Medication Sig Start Date End Date Taking? Authorizing Provider  Albuterol Sulfate (PROAIR RESPICLICK) 177 (90 Base) MCG/ACT AEPB Inhale 2 puffs into the lungs daily as needed. Patient taking differently: Inhale 2 puffs into the lungs daily as needed (wheezing). 11/08/20 12/08/20 Yes Allwardt, Alyssa M, PA-C  amLODipine (NORVASC) 10 MG tablet Take 1 tablet (10 mg total) by mouth daily. 11/08/20  Yes Allwardt, Alyssa M, PA-C  azithromycin (ZITHROMAX) 250 MG tablet Take 1 tablet (250 mg total) by mouth daily. Take first 2 tablets together, then 1 every day until finished. 11/19/20  Yes Luna Fuse, MD  busPIRone (BUSPAR) 5 MG tablet Take 1 tablet (5 mg total) by mouth 3 (three) times daily. 02/03/20  Yes [name], MD  Carboxymethylcell-Hypromellose (GENTEAL) 0.25-0.3 % GEL Place 1 drop into both eyes daily as needed (dryeyes).   Yes [provider]  carvedilol (COREG) 12.5 MG tablet Take 1  tablet (12.5 mg total) by mouth 2 (two) times daily with a meal. 04/16/20 11/19/20 Yes Dahal, Marlowe Aschoff, MD  ceFAZolin (ANCEF) IVPB Inject 2 g into the vein every 8 (eight) hours for 20 days. Indication:  Wound infection  First Dose: Yes Last Day of Therapy:  12/01/20 Labs - Once weekly:  CBC/D and BMP, Labs - Every other week:  ESR and CRP Method of administration: IV Push Method of administration may be changed at the discretion of home infusion pharmacist based upon assessment of the patient and/or caregiver's ability to self-administer the medication ordered. 11/11/20 12/01/20  Yes [place] Donnamarie Poag, DO  cyclobenzaprine (FLEXERIL) 10 MG tablet Take 1 tablet (10 mg total) by mouth 3 (three) times daily as needed for muscle spasms. 11/15/20  Yes [name], MD  dicyclomine (BENTYL) 20 MG tablet Take 1 tablet (20 mg total) by mouth every 8 (eight) hours as needed for spasms. 11/25/19  Yes Petrucelli, Samantha R, PA-C  furosemide (LASIX) 40 MG tablet Take 1 tablet (40 mg total) by mouth daily. 05/24/20 11/20/20 Yes Mercy Riding, MD  gabapentin (NEURONTIN) 100 MG capsule Take 200 mg by mouth 3 (three) times daily. 11/02/20  Yes [provider]  hydrALAZINE (APRESOLINE) 50 MG tablet Take 50 mg by mouth 4 (four) times daily. 09/14/20  Yes [provider]  insulin glargine (LANTUS) 100 UNIT/ML injection Inject 0.08 mLs (8 Units total) into the skin daily. 11/05/20  Yes [place] Eric J, DO  insulin lispro (HUMALOG) 100 UNIT/ML injection Inject 0-5 Units into the skin 3 (three) times daily before meals. Sliding scale   Yes [provider]  loperamide (IMODIUM) 2 MG capsule Take 6 mg by mouth daily as needed for diarrhea or loose stools.    Yes [provider]  LORazepam (ATIVAN) 0.5 MG tablet Take 1 tablet (0.5 mg total) by mouth 2 (two) times daily. 11/15/20  Yes [name], MD  metoCLOPramide (REGLAN) 5 MG tablet Take 1 tablet (5 mg total) by mouth 3 (three) times daily before  meals. Please make and keep an appointment for further refills. 11/13/20 02/11/21 Yes [place] Eric J, DO  mirtazapine (REMERON) 30 MG tablet TAKE 1 TABLET BY MOUTH AT  BEDTIME Patient taking differently: Take 30 mg by mouth at bedtime. 01/17/20  Yes [name], MD  ondansetron (ZOFRAN ODT) 4 MG disintegrating tablet Take 1 tablet (4 mg total) by mouth every 8 (eight) hours as needed for nausea or vomiting. 11/08/20  Yes Allwardt, Alyssa M, PA-C  oxyCODONE-acetaminophen (PERCOCET) 10-325 MG tablet Take 1 tablet by mouth every 4 (four) hours as needed for pain. 11/13/20  Yes [name], MD  pantoprazole (PROTONIX) 40 MG tablet Take 1 tablet (40 mg total) by mouth 2 (two) times daily. 12/06/19  Yes Armbruster, Carlota Raspberry, MD  traZODone (DESYREL) 50 MG tablet Take 0.5-1 tablets (25-50 mg total) by mouth at bedtime as needed for sleep. 11/15/20  Yes [name], MD  ceFEPIme 2 g in sodium chloride 0.9 % 100 mL Inject 2 g into the vein every 12 (twelve) hours. Start date : 11/06/20    [provider]  Continuous Blood Gluc Sensor (DEXCOM G6 SENSOR) MISC USE AS DIRECTED EVERY 10 DAYS 07/28/20   Renato Shin, MD  Continuous Blood Gluc Transmit (DEXCOM G6 TRANSMITTER) MISC USE AS DIRECTED TO  MONITOR  GLUCOSE  LEVELS 05/10/20   Renato Shin, MD  EPINEPHrine 0.3 mg/0.3 mL IJ SOAJ injection Inject 0.3 mg into the muscle once as needed (anaphylaxis/allergic reaction). Patient taking differently: Inject 0.3 mg into the muscle once as needed for anaphylaxis. 07/16/20   [name], MD  furosemide (LASIX) 40 MG tablet TAKE 1 TABLET (40 MG TOTAL) BY MOUTH DAILY. Patient not taking: No sig reported 05/24/20 05/24/21  Mercy Riding, MD  Insulin Pen Needle 32G X 4 MM MISC USE AS DIRECTED WITH INSULIN PENS 05/24/20 05/24/21  Mercy Riding, MD  potassium chloride SA (KLOR-CON) 20 MEQ tablet Take 1 tablet (20 mEq total) by mouth daily for 5 doses. 07/04/19 07/30/19  Soto,  Johana, PA-C    Allergies    Other  and Lactose intolerance (gi)  Review of Systems   Review of Systems  Constitutional:  Negative for fever.  HENT:  Negative for ear pain.   Eyes:  Negative for pain.  Respiratory:  Positive for cough.   Cardiovascular:  Negative for chest pain.  Gastrointestinal:  Negative for abdominal pain.  Genitourinary:  Negative for flank pain.  Musculoskeletal:  Negative for back pain.  Skin:  Negative for rash.  Neurological:  Negative for headaches.   Physical Exam Updated Vital Signs BP (!) 158/88   Pulse 90   Temp 98.1 F (36.7 C) (Oral)   Resp 18   Ht _0  (1.702 m)   Wt 72.6 kg   SpO2 96%   BMI 25.06 kg/m   Physical Exam Constitutional:      General: She is not in acute distress.    Appearance: Normal appearance.  HENT:     Head: Normocephalic.     Comments: Mild swelling seen to the left upper eyelid no abnormal warmth no cellulitis.  Patient able to open and close her eyes normally.    Nose: Nose normal.  Eyes:     Extraocular Movements: Extraocular movements intact.     Conjunctiva/sclera: Conjunctivae normal.     Pupils: Pupils are equal, round, and reactive to light.     Comments: And is benign bilaterally.  No conjunctivitis noted.  Cardiovascular:     Rate and Rhythm: Tachycardia present.  Pulmonary:     Effort: Pulmonary effort is normal.  Musculoskeletal:        General: Normal range of motion.     Cervical back: Normal range of motion.  Neurological:     General: No focal deficit present.     Mental Status: She is alert. Mental status is at baseline.    ED Results / Procedures / Treatments   Labs (all labs ordered are listed, but only abnormal results are displayed) Labs Reviewed - No data to display  EKG None  Radiology DG Chest 2 View  Result Date: 11/19/2020 CLINICAL DATA:  Shortness of breath.  Facial swelling. EXAM: CHEST - 2 VIEW COMPARISON:  Chest x-ray dated November 12, 2020. FINDINGS: Unchanged right upper extremity PICC line. Normal heart  size. New trace bilateral pleural effusions. New left perihilar and bibasilar opacities. No pneumothorax. No acute osseous abnormality. IMPRESSION: 1. New left perihilar opacity concerning for pneumonia. 2. New trace bilateral pleural effusions with bibasilar atelectasis versus infiltrate. Electronically Signed   By: Titus Dubin M.D.   On: 11/19/2020 16:14    Procedures Procedures   Medications Ordered in ED Medications  diphenhydrAMINE (BENADRYL) capsule 25 mg (25 mg Oral Given 11/19/20 1751)  famotidine (PEPCID) tablet 20 mg (20 mg Oral Given 11/19/20 1751)  cefTRIAXone (ROCEPHIN) injection 1 g (1 g Intramuscular Given 11/19/20 1751)    ED Course  I have reviewed the triage vital signs and the nursing notes.  Pertinent labs & imaging results that were available during my care of the patient were reviewed by me and considered in my medical decision making (see chart for details).    MDM Rules/Calculators/A&P                           Additional lab studies were ordered.  Patient presents with very mild swelling of the left upper eyelid.  Does not appear to be sepsis or cellulitis on exam, allergic  reaction is possible but appears very mild at this time.  Given Rocephin IM here, will be given a prescription of antibiotics to go home with.   Patient was in the ER for several hours with no additional adverse events.  No significant worsening of this mild edema.  Will be discharged home.  Given a prescription of Benadryl and prednisone.  Advised follow-up with her primary care doctor within 3 to 4 days.  Advising immediate return for worsening swelling or any additional concerns.    Final Clinical Impression(s) / ED Diagnoses Final diagnoses:  Facial swelling  Community acquired pneumonia, unspecified laterality    Rx / DC Orders ED Discharge Orders          Ordered    azithromycin (ZITHROMAX) 250 MG tablet  Daily        11/19/20 1800             Luna Fuse,  MD 11/19/20 1800

## 2020-11-20 ENCOUNTER — Telehealth: Payer: Self-pay

## 2020-11-20 ENCOUNTER — Other Ambulatory Visit: Payer: Self-pay

## 2020-11-20 DIAGNOSIS — I96 Gangrene, not elsewhere classified: Secondary | ICD-10-CM | POA: Diagnosis not present

## 2020-11-20 DIAGNOSIS — L02611 Cutaneous abscess of right foot: Secondary | ICD-10-CM | POA: Diagnosis not present

## 2020-11-20 DIAGNOSIS — E10628 Type 1 diabetes mellitus with other skin complications: Secondary | ICD-10-CM | POA: Diagnosis not present

## 2020-11-20 DIAGNOSIS — S91302A Unspecified open wound, left foot, initial encounter: Secondary | ICD-10-CM | POA: Diagnosis not present

## 2020-11-20 DIAGNOSIS — M86272 Subacute osteomyelitis, left ankle and foot: Secondary | ICD-10-CM | POA: Diagnosis not present

## 2020-11-20 DIAGNOSIS — J80 Acute respiratory distress syndrome: Secondary | ICD-10-CM | POA: Diagnosis not present

## 2020-11-20 DIAGNOSIS — M86171 Other acute osteomyelitis, right ankle and foot: Secondary | ICD-10-CM | POA: Diagnosis not present

## 2020-11-20 NOTE — Patient Outreach (Signed)
Care Coordination  1/77/9390  [name] [birth year] [id number]   This RNCM contacted the patient by phone successfully.  Patient was not at home and could not talk in order to complete the initial visit.  Patient requested we reschedule this initial visit to another day.  I agreed with her request to reschedule.  I sent a message to [facility], Reita Chard, to reschedule the initial visit within the next 7 days. I plan to contact patient within the next 7 days to complete the initial visit.  Salvatore Marvel RN, BSN Community Care Coordinator [facility] Mobile: [phone]

## 2020-11-20 NOTE — Telephone Encounter (Signed)
Transition Care Management Follow-up Telephone Call Date of discharge and from where: 11/19/2020-[facility] How have you been since you were released from the hospital? Patient stated she is doing fine.  Any questions or concerns? No  Items Reviewed: Did the pt receive and understand the discharge instructions provided? Yes  Medications obtained and verified? Yes  Other? No  Any new allergies since your discharge? No  Dietary orders reviewed? N/A Do you have support at home? No   Home Care and Equipment/Supplies: Were home health services ordered? not applicable If so, what is the name of the agency? N/A  Has the agency set up a time to come to the patient's home? not applicable Were any new equipment or medical supplies ordered?  No What is the name of the medical supply agency? N/A Were you able to get the supplies/equipment? not applicable Do you have any questions related to the use of the equipment or supplies? No  Functional Questionnaire: (I = Independent and D = Dependent) ADLs: I  Bathing/Dressing- I  Meal Prep- I  Eating- I  Maintaining continence- I  Transferring/Ambulation- I  Managing Meds- I  Follow up appointments reviewed:  PCP Hospital f/u appt confirmed? No   Specialist Hospital f/u appt confirmed? No   Are transportation arrangements needed? No  If their condition worsens, is the pt aware to call PCP or go to the Emergency Dept.? Yes Was the patient provided with contact information for the PCP's office or ED? Yes Was to pt encouraged to call back with questions or concerns? Yes

## 2020-11-20 NOTE — Telephone Encounter (Signed)
.   Encourage patient to contact the pharmacy for refills or they can request refills through [facility]:  Please schedule appointment if longer than 1 year  NEXT APPOINTMENT DATE:  MEDICATION:oxyCODONE-acetaminophen (PERCOCET) 10-325 MG tablet  Is the patient out of medication?   PHARMACY:[facility] [place], [address] [place]

## 2020-11-21 ENCOUNTER — Other Ambulatory Visit: Payer: Self-pay

## 2020-11-21 MED ORDER — OXYCODONE-ACETAMINOPHEN 10-325 MG PO TABS: 1.0000 | ORAL_TABLET | ORAL | 0 refills | Status: DC | PRN

## 2020-11-21 NOTE — Telephone Encounter (Signed)
Pt called back to check the status of her prescription

## 2020-11-21 NOTE — Telephone Encounter (Signed)
Last filled 11/13/20 #30, 0

## 2020-11-21 NOTE — Telephone Encounter (Signed)
Refill request sent to PCP.

## 2020-11-22 DIAGNOSIS — L089 Local infection of the skin and subcutaneous tissue, unspecified: Secondary | ICD-10-CM | POA: Diagnosis not present

## 2020-11-22 DIAGNOSIS — E10628 Type 1 diabetes mellitus with other skin complications: Secondary | ICD-10-CM | POA: Diagnosis not present

## 2020-11-22 DIAGNOSIS — Z89412 Acquired absence of left great toe: Secondary | ICD-10-CM | POA: Diagnosis not present

## 2020-11-27 ENCOUNTER — Other Ambulatory Visit: Payer: Self-pay

## 2020-11-27 MED ORDER — OXYCODONE-ACETAMINOPHEN 10-325 MG PO TABS: 1.0000 | ORAL_TABLET | ORAL | 0 refills | Status: DC | PRN

## 2020-11-27 NOTE — Telephone Encounter (Signed)
  Encourage patient to contact the pharmacy for refills or they can request refills through [place]:  11/15/2020  NEXT APPOINTMENT DATE:  MEDICATION:  oxyCODONE-acetaminophen (PERCOCET) 10-325 MG tablet  Is the patient out of medication? YES  PHARMACY:[facility] [place], [place] [address]: Advised that it may not be sent in today but will send it back as a high priority.   Let patient know to contact pharmacy at the end of the day to make sure medication is ready.  Please notify patient to allow 48-72 hours to process

## 2020-11-27 NOTE — Patient Instructions (Signed)
9/00/9200 Name: [name] MRN: [id number] DOB: 05-Dec-1990  Referred by: [name], MD Reason for referral : High Risk Managed Medicaid (Unsuccessful Telephone Outreach)   An unsuccessful telephone outreach was attempted today. The patient was referred to the case management team for assistance with care management and care coordination.    Follow Up Plan: The Managed Medicaid care management team will reach out to the patient again over the next 7 days.    Salvatore Marvel RN, BSN Community Care Coordinator [facility] Mobile: [phone]

## 2020-11-27 NOTE — Patient Outreach (Signed)
Care Coordination  6/89/5702  SULY VUKELICH [birth year] [id number]  06/21/4686 Name: [name] MRN: [id number] DOB: 1990/07/16  Referred by: [name], MD Reason for referral : High Risk Managed Medicaid (Unsuccessful Telephone Outreach)   An unsuccessful telephone outreach was attempted today. The patient was referred to the case management team for assistance with care management and care coordination.    Follow Up Plan: The Managed Medicaid care management team will reach out to the patient again over the next 7 days.   Salvatore Marvel RN, BSN Community Care Coordinator [facility] Mobile: [phone]

## 2020-11-29 ENCOUNTER — Encounter (HOSPITAL_COMMUNITY): Payer: Self-pay | Admitting: Emergency Medicine

## 2020-11-29 ENCOUNTER — Emergency Department (HOSPITAL_COMMUNITY)
Admission: EM | Admit: 2020-11-29 | Discharge: 2020-11-29 | Disposition: A | Discharge status: Home / Self Care | Payer: Medicaid Other | Attending: Emergency Medicine | Admitting: Emergency Medicine

## 2020-11-29 DIAGNOSIS — R000 Tachycardia, unspecified: Secondary | ICD-10-CM | POA: Diagnosis not present

## 2020-11-29 DIAGNOSIS — Z794 Long term (current) use of insulin: Secondary | ICD-10-CM | POA: Insufficient documentation

## 2020-11-29 DIAGNOSIS — Z79899 Other long term (current) drug therapy: Secondary | ICD-10-CM | POA: Insufficient documentation

## 2020-11-29 DIAGNOSIS — N183 Chronic kidney disease, stage 3 unspecified: Secondary | ICD-10-CM | POA: Diagnosis not present

## 2020-11-29 DIAGNOSIS — I129 Hypertensive chronic kidney disease with stage 1 through stage 4 chronic kidney disease, or unspecified chronic kidney disease: Secondary | ICD-10-CM | POA: Insufficient documentation

## 2020-11-29 DIAGNOSIS — Z8616 Personal history of COVID-19: Secondary | ICD-10-CM | POA: Diagnosis not present

## 2020-11-29 DIAGNOSIS — E1022 Type 1 diabetes mellitus with diabetic chronic kidney disease: Secondary | ICD-10-CM | POA: Diagnosis not present

## 2020-11-29 DIAGNOSIS — K3184 Gastroparesis: Secondary | ICD-10-CM | POA: Insufficient documentation

## 2020-11-29 DIAGNOSIS — R109 Unspecified abdominal pain: Secondary | ICD-10-CM | POA: Diagnosis not present

## 2020-11-29 DIAGNOSIS — G8929 Other chronic pain: Secondary | ICD-10-CM | POA: Diagnosis not present

## 2020-11-29 LAB — COMPREHENSIVE METABOLIC PANEL
ALT: 21 U/L (ref 0–44)
AST: 77 U/L — ABNORMAL HIGH (ref 15–41)
Albumin: 2.6 g/dL — ABNORMAL LOW (ref 3.5–5.0)
Alkaline Phosphatase: 223 U/L — ABNORMAL HIGH (ref 38–126)
Anion gap: 14 (ref 5–15)
BUN: 36 mg/dL — ABNORMAL HIGH (ref 6–20)
CO2: 18 mmol/L — ABNORMAL LOW (ref 22–32)
Calcium: 9 mg/dL (ref 8.9–10.3)
Chloride: 110 mmol/L (ref 98–111)
Creatinine, Ser: 2 mg/dL — ABNORMAL HIGH (ref 0.44–1.00)
GFR, Estimated: 34 mL/min — ABNORMAL LOW (ref >60)
Glucose, Bld: 210 mg/dL — ABNORMAL HIGH (ref 70–99)
Potassium: 4.1 mmol/L (ref 3.5–5.1)
Sodium: 142 mmol/L (ref 135–145)
Total Bilirubin: 0.8 mg/dL (ref 0.3–1.2)
Total Protein: 8.3 g/dL — ABNORMAL HIGH (ref 6.5–8.1)

## 2020-11-29 LAB — URINALYSIS, ROUTINE W REFLEX MICROSCOPIC
Bacteria, UA: NONE SEEN
Bilirubin Urine: NEGATIVE
Glucose, UA: 500 mg/dL — AB
Leukocytes,Ua: NEGATIVE
Nitrite: NEGATIVE
Protein, ur: >300 mg/dL — AB
Specific Gravity, Urine: 1.025 (ref 1.005–1.030)
pH: 6 (ref 5.0–8.0)

## 2020-11-29 LAB — CBC WITH DIFFERENTIAL/PLATELET
Abs Immature Granulocytes: 0.14 10*3/uL — ABNORMAL HIGH (ref 0.00–0.07)
Basophils Absolute: 0.1 10*3/uL (ref 0.0–0.1)
Basophils Relative: 1 %
Eosinophils Absolute: 0 10*3/uL (ref 0.0–0.5)
Eosinophils Relative: 0 %
HCT: 30.4 % — ABNORMAL LOW (ref 36.0–46.0)
Hemoglobin: 9.4 g/dL — ABNORMAL LOW (ref 12.0–15.0)
Immature Granulocytes: 1 %
Lymphocytes Relative: 12 %
Lymphs Abs: 1.2 10*3/uL (ref 0.7–4.0)
MCH: 29.3 pg (ref 26.0–34.0)
MCHC: 30.9 g/dL (ref 30.0–36.0)
MCV: 94.7 fL (ref 80.0–100.0)
Monocytes Absolute: 0.5 10*3/uL (ref 0.1–1.0)
Monocytes Relative: 5 %
Neutro Abs: 8.2 10*3/uL — ABNORMAL HIGH (ref 1.7–7.7)
Neutrophils Relative %: 81 %
Platelets: 286 10*3/uL (ref 150–400)
RBC: 3.21 MIL/uL — ABNORMAL LOW (ref 3.87–5.11)
RDW: 13.8 % (ref 11.5–15.5)
WBC: 10.1 10*3/uL (ref 4.0–10.5)
nRBC: 0.2 % (ref 0.0–0.2)

## 2020-11-29 LAB — HCG, QUANTITATIVE, PREGNANCY: hCG, Beta Chain, Quant, S: 1 m[IU]/mL (ref <5)

## 2020-11-29 LAB — CBG MONITORING, ED
Glucose-Capillary: 182 mg/dL — ABNORMAL HIGH (ref 70–99)
Glucose-Capillary: 211 mg/dL — ABNORMAL HIGH (ref 70–99)

## 2020-11-29 LAB — LIPASE, BLOOD: Lipase: 19 U/L (ref 11–51)

## 2020-11-29 MED ORDER — HYDROMORPHONE HCL 1 MG/ML IJ SOLN
1.0000 mg | Freq: Once | INTRAMUSCULAR | Status: AC
  Administered 2020-11-29: 1 mg via INTRAVENOUS
  Filled 2020-11-29: qty 1

## 2020-11-29 MED ORDER — METOCLOPRAMIDE HCL 5 MG/ML IJ SOLN
10.0000 mg | Freq: Once | INTRAMUSCULAR | Status: AC
  Administered 2020-11-29: 10 mg via INTRAVENOUS
  Filled 2020-11-29: qty 2

## 2020-11-29 MED ORDER — SODIUM CHLORIDE 0.9 % IV BOLUS
1000.0000 mL | Freq: Once | INTRAVENOUS | Status: AC
  Administered 2020-11-29: 1000 mL via INTRAVENOUS

## 2020-11-29 MED ORDER — FENTANYL CITRATE PF 50 MCG/ML IJ SOSY
100.0000 ug | PREFILLED_SYRINGE | Freq: Once | INTRAMUSCULAR | Status: AC
  Administered 2020-11-29: 100 ug via INTRAVENOUS
  Filled 2020-11-29: qty 2

## 2020-11-29 NOTE — ED Notes (Signed)
An After Visit Summary was printed and given to the patient. Discharge instructions given and no further questions at this time.  

## 2020-11-29 NOTE — ED Provider Notes (Signed)
[facility] DEPT Provider Note   CSN: [id number] Arrival date & time: 11/29/20  1033     History Chief Complaint  Patient presents with   Abdominal Pain   Emesis    [name] is a 30 y.o. female.  Patient with history of diabetes, gastroparesis, abdominal pain chronic, recent L great toe amputation 2/2 osteomyelitis, on IV daptomycin ending yesterday -- presents to the emergency department today for epigastric abdominal pain with radiation to the back as well as nausea and vomiting over the past 2 days.  Previous amputation and chronic kidney disease secondary to her diabetes.  Patient reports vomiting every hour over the past couple of days.  Pain is similar to prior symptoms as well.  No diarrhea or constipation.  No urinary symptoms.  No chest pain or shortness of breath.  She takes Zofran and oxycodone at home, however these have not been helping.  Onset of symptoms acute.  Course is constant.  Nothing make symptoms better or worse.      Past Medical History:  Diagnosis Date   Allergy    Anemia    Anxiety    Blood transfusion without reported diagnosis    Dec 2018   Cataract    right eye   COVID-19 03/2020   Depression    Diabetes type 1, uncontrolled ([place]) 11/14/2011   Since age 82   Fibromyalgia    Gastroparesis    GERD (gastroesophageal reflux disease)    Hypertension    Infection    UTI April 2016    Patient Active Problem List   Diagnosis Date Noted   Type 1 diabetes mellitus with diabetic chronic kidney disease ([place]) 11/15/2020   Insomnia 11/15/2020   Iron deficiency 04/11/2020   Osteomyelitis ([place]) 04/10/2020   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 ([place]) 02/03/2020   Cannabinoid hyperemesis syndrome 04/30/2019   Contraception management 08/06/2018   Diabetic gastroparesis associated with type 1 diabetes mellitus ([place]) 06/04/2018   Anxiety 05/31/2018   Peripheral edema 05/31/2018   Chronic anemia 07/10/2017    GERD (gastroesophageal reflux disease) 06/23/2017   MDD (major depressive disorder), recurrent episode, mild ([place]) 02/28/2017   Severe protein-calorie malnutrition ([place]) 04/28/2013   Hypertension associated with diabetes ([place]) 11/14/2011    Past Surgical History:  Procedure Laterality Date   AMPUTATION Right 04/13/2020   Procedure: 1ST RAY AMPUTATION RIGHT FOOT;  Surgeon: Newt Minion, MD;  Location: [facility];  Service: Orthopedics;  Laterality: Right;   AMPUTATION Right 06/06/2020   Procedure: RIGHT BELOW KNEE AMPUTATION;  Surgeon: Newt Minion, MD;  Location: [place];  Service: Orthopedics;  Laterality: Right;   AMPUTATION Left    great toe   ANKLE SURGERY     APPLICATION OF WOUND VAC Right 06/06/2020   Procedure: APPLICATION OF WOUND VAC;  Surgeon: Newt Minion, MD;  Location: [place] [facility];  Service: Orthopedics;  Laterality: Right;   CHOLECYSTECTOMY  11/15/2011   Procedure: LAPAROSCOPIC CHOLECYSTECTOMY WITH INTRAOPERATIVE CHOLANGIOGRAM;  Surgeon: [name], MD;  Location: [facility];  Service: General;  Laterality: N/A;   COLONOSCOPY     COLONOSCOPY WITH PROPOFOL N/A 06/27/2017   Procedure: COLONOSCOPY WITH PROPOFOL;  Surgeon: Milus Banister, MD;  [facility];  Service: Endoscopy;  Laterality: N/A;   ESOPHAGOGASTRODUODENOSCOPY  12/03/2011   Procedure: ESOPHAGOGASTRODUODENOSCOPY (EGD);  Surgeon: Beryle Beams, MD;  Location: [facility];  Service: Endoscopy;  Laterality: N/A;   FLEXIBLE SIGMOIDOSCOPY N/A 03/10/2017  Procedure: FLEXIBLE SIGMOIDOSCOPY;  Surgeon: Carol Ada, MD;  Location: [facility];  Service: Endoscopy;  Laterality: N/A;   INCISION AND DRAINAGE PERIRECTAL ABSCESS N/A 03/01/2017   Procedure: IRRIGATION AND DEBRIDEMENT PERIRECTAL ABSCESS;  Surgeon: Alphonsa Overall, MD;  Location: [facility];  Service: General;  Laterality: N/A;   IRRIGATION AND DEBRIDEMENT BUTTOCKS N/A 03/23/2017   Procedure: IRRIGATION AND DEBRIDEMENT BUTTOCKS, SETON PLACEMENT;  Surgeon:  [name], MD;  Location: [facility];  Service: General;  Laterality: N/A;   LAPAROSCOPY  11/23/2011   Procedure: LAPAROSCOPY DIAGNOSTIC;  Surgeon: Edward Jolly, MD;  Location[facility];  Service: General;  Laterality: N/A;   SIGMOIDOSCOPY     UPPER GASTROINTESTINAL ENDOSCOPY     WISDOM TOOTH EXTRACTION       OB History     Gravida  1   Para  1   Term      Preterm  1   AB      Living  1      SAB      IAB      Ectopic      Multiple  0   Live Births  1           Family History  Problem Relation Age of Onset   Diabetes Mother    Hypertension Father    Colon cancer Paternal Grandmother        pt thinks PGM was dx in her 75's   Diabetes Paternal Grandmother    Diabetes Maternal Grandmother    Diabetes Maternal Grandfather    Diabetes Paternal Grandfather    Diabetes Other    Breast cancer Paternal Aunt    Esophageal cancer Neg Hx    Liver cancer Neg Hx    Pancreatic cancer Neg Hx    Stomach cancer Neg Hx    Rectal cancer Neg Hx     Social History   Tobacco Use   Smoking status: Never   Smokeless tobacco: Never  Vaping Use   Vaping Use: Never used  Substance Use Topics   Alcohol use: No    Alcohol/week: 0.0 standard drinks   Drug use: Not Currently    Types: Marijuana    Home Medications Prior to Admission medications   Medication Sig Start Date End Date Taking? Authorizing Provider  Albuterol Sulfate (PROAIR RESPICLICK) 016 (90 Base) MCG/ACT AEPB Inhale 2 puffs into the lungs daily as needed. Patient taking differently: Inhale 2 puffs into the lungs daily as needed (wheezing). 11/08/20 12/08/20  Allwardt, Randa Evens, PA-C  amLODipine (NORVASC) 10 MG tablet Take 1 tablet (10 mg total) by mouth daily. 11/08/20   Allwardt, Randa Evens, PA-C  azithromycin (ZITHROMAX) 250 MG tablet Take 1 tablet (250 mg total) by mouth daily. Take first 2 tablets together, then 1 every day until finished. 11/19/20   Luna Fuse, MD  busPIRone (BUSPAR) 5 MG tablet  Take 1 tablet (5 mg total) by mouth 3 (three) times daily. 02/03/20   [name], MD  Carboxymethylcell-Hypromellose (GENTEAL) 0.25-0.3 % GEL Place 1 drop into both eyes daily as needed (dryeyes).    [provider]  carvedilol (COREG) 12.5 MG tablet Take 1 tablet (12.5 mg total) by mouth 2 (two) times daily with a meal. 04/16/20 11/19/20  Dahal, Marlowe Aschoff, MD  ceFAZolin (ANCEF) IVPB Inject 2 g into the vein every 8 (eight) hours for 20 days. Indication:  Wound infection  First Dose: Yes Last Day of Therapy:  12/01/20 Labs - Once weekly:  CBC/D and BMP, Labs - Every other week:  ESR and CRP Method of administration: IV Push Method of administration may be changed at the discretion of home infusion pharmacist based upon assessment of the patient and/or caregiver's ability to self-administer the medication ordered. 11/11/20 12/01/20  [place] Donnamarie Poag, DO  ceFEPIme 2 g in sodium chloride 0.9 % 100 mL Inject 2 g into the vein every 12 (twelve) hours. Start date : 11/06/20    [provider]  Continuous Blood Gluc Sensor (DEXCOM G6 SENSOR) MISC USE AS DIRECTED EVERY 10 DAYS 07/28/20   Renato Shin, MD  Continuous Blood Gluc Transmit (DEXCOM G6 TRANSMITTER) MISC USE AS DIRECTED TO  MONITOR  GLUCOSE  LEVELS 05/10/20   Renato Shin, MD  cyclobenzaprine (FLEXERIL) 10 MG tablet Take 1 tablet (10 mg total) by mouth 3 (three) times daily as needed for muscle spasms. 11/15/20   [name], MD  dicyclomine (BENTYL) 20 MG tablet Take 1 tablet (20 mg total) by mouth every 8 (eight) hours as needed for spasms. 11/25/19   Petrucelli, Samantha R, PA-C  EPINEPHrine 0.3 mg/0.3 mL IJ SOAJ injection Inject 0.3 mg into the muscle once as needed (anaphylaxis/allergic reaction). Patient taking differently: Inject 0.3 mg into the muscle once as needed for anaphylaxis. 07/16/20   [name], MD  furosemide (LASIX) 40 MG tablet Take 1 tablet (40 mg total) by mouth daily. 05/24/20 11/20/20  Mercy Riding, MD   furosemide (LASIX) 40 MG tablet TAKE 1 TABLET (40 MG TOTAL) BY MOUTH DAILY. Patient not taking: No sig reported 05/24/20 05/24/21  Mercy Riding, MD  gabapentin (NEURONTIN) 100 MG capsule Take 200 mg by mouth 3 (three) times daily. 11/02/20   [provider]  hydrALAZINE (APRESOLINE) 50 MG tablet Take 50 mg by mouth 4 (four) times daily. 09/14/20   [provider]  insulin glargine (LANTUS) 100 UNIT/ML injection Inject 0.08 mLs (8 Units total) into the skin daily. 11/05/20   [place] Eric J, DO  insulin lispro (HUMALOG) 100 UNIT/ML injection Inject 0-5 Units into the skin 3 (three) times daily before meals. Sliding scale    [provider]  Insulin Pen Needle 32G X 4 MM MISC USE AS DIRECTED WITH INSULIN PENS 05/24/20 05/24/21  Mercy Riding, MD  loperamide (IMODIUM) 2 MG capsule Take 6 mg by mouth daily as needed for diarrhea or loose stools.     [provider]  LORazepam (ATIVAN) 0.5 MG tablet Take 1 tablet (0.5 mg total) by mouth 2 (two) times daily. 11/15/20   [name], MD  metoCLOPramide (REGLAN) 5 MG tablet Take 1 tablet (5 mg total) by mouth 3 (three) times daily before meals. Please make and keep an appointment for further refills. 11/13/20 02/11/21  [place] Donnamarie Poag, DO  mirtazapine (REMERON) 30 MG tablet TAKE 1 TABLET BY MOUTH AT  BEDTIME Patient taking differently: Take 30 mg by mouth at bedtime. 01/17/20   [name], MD  ondansetron (ZOFRAN ODT) 4 MG disintegrating tablet Take 1 tablet (4 mg total) by mouth every 8 (eight) hours as needed for nausea or vomiting. 11/08/20   Allwardt, Randa Evens, PA-C  oxyCODONE-acetaminophen (PERCOCET) 10-325 MG tablet Take 1 tablet by mouth every 4 (four) hours as needed for pain. 11/27/20   [name], MD  pantoprazole (PROTONIX) 40 MG tablet Take 1 tablet (40 mg total) by mouth 2 (two) times daily. 12/06/19   Armbruster, Carlota Raspberry, MD  traZODone (DESYREL) 50 MG  tablet Take 0.5-1 tablets (25-50 mg total) by mouth at  bedtime as needed for sleep. 11/15/20   [name], MD  potassium chloride SA (KLOR-CON) 20 MEQ tablet Take 1 tablet (20 mEq total) by mouth daily for 5 doses. 07/04/19 07/30/19  Janeece Fitting, PA-C    Allergies    Other and Lactose intolerance (gi)  Review of Systems   Review of Systems  Constitutional:  Negative for fever.  HENT:  Negative for rhinorrhea and sore throat.   Eyes:  Negative for redness.  Respiratory:  Negative for cough.   Cardiovascular:  Negative for chest pain.  Gastrointestinal:  Positive for abdominal pain, nausea and vomiting. Negative for diarrhea.  Genitourinary:  Negative for dysuria, frequency, hematuria and urgency.  Musculoskeletal:  Negative for myalgias.  Skin:  Negative for rash.  Neurological:  Negative for headaches.   Physical Exam Updated Vital Signs BP (!) 203/110 (BP Location: Left Arm)   Pulse (!) 108   Temp 98.4 F (36.9 C) (Oral)   Resp 18   SpO2 95%   Physical Exam Vitals and nursing note reviewed.  Constitutional:      General: She is in acute distress.     Appearance: She is well-developed.     Comments: Upon entering the exam room, patient is found sitting forward in the exam chair, crying, moaning.  HENT:     Head: Normocephalic and atraumatic.     Right Ear: External ear normal.     Left Ear: External ear normal.     Nose: Nose normal.  Eyes:     Conjunctiva/sclera: Conjunctivae normal.  Cardiovascular:     Rate and Rhythm: Regular rhythm. Tachycardia present.     Heart sounds: No murmur heard. Pulmonary:     Effort: No respiratory distress.     Breath sounds: No wheezing, rhonchi or rales.  Abdominal:     Palpations: Abdomen is soft.     Tenderness: There is abdominal tenderness (Moderate) in the epigastric area and left upper quadrant. There is no guarding or rebound.  Musculoskeletal:     Cervical back: Normal range of motion and neck supple.     Right lower leg: No edema.     Left lower leg: No edema.  Skin:     General: Skin is warm and dry.     Findings: No rash.  Neurological:     General: No focal deficit present.     Mental Status: She is alert. Mental status is at baseline.     Motor: No weakness.  Psychiatric:        Mood and Affect: Mood normal.    ED Results / Procedures / Treatments   Labs (all labs ordered are listed, but only abnormal results are displayed) Labs Reviewed  COMPREHENSIVE METABOLIC PANEL - Abnormal; Notable for the following components:      Result Value   CO2 18 (*)    Glucose, Bld 210 (*)    BUN 36 (*)    Creatinine, Ser 2.00 (*)    Total Protein 8.3 (*)    Albumin 2.6 (*)    AST 77 (*)    Alkaline Phosphatase 223 (*)    GFR, Estimated 34 (*)    All other components within normal limits  CBC WITH DIFFERENTIAL/PLATELET - Abnormal; Notable for the following components:   RBC 3.21 (*)    Hemoglobin 9.4 (*)    HCT 30.4 (*)    Neutro Abs 8.2 (*)    Abs  Immature Granulocytes 0.14 (*)    All other components within normal limits  URINALYSIS, ROUTINE W REFLEX MICROSCOPIC - Abnormal; Notable for the following components:   Color, Urine YELLOW (*)    APPearance CLEAR (*)    Glucose, UA 500 (*)    Hgb urine dipstick LARGE (*)    Ketones, ur TRACE (*)    Protein, ur >300 (*)    All other components within normal limits  CBG MONITORING, ED - Abnormal; Notable for the following components:   Glucose-Capillary 182 (*)    All other components within normal limits  LIPASE, BLOOD  HCG, QUANTITATIVE, PREGNANCY  I-STAT BETA HCG BLOOD, ED ([facility], [facility], [facility] ONLY)  CBG MONITORING, ED    EKG None  Radiology No results found.  Procedures Procedures   Medications Ordered in ED Medications  sodium chloride 0.9 % bolus 1,000 mL (has no administration in time range)  metoCLOPramide (REGLAN) injection 10 mg (has no administration in time range)  fentaNYL (SUBLIMAZE) injection 100 mcg (has no administration in time range)    ED Course  I have reviewed the triage  vital signs and the nursing notes.  Pertinent labs & imaging results that were available during my care of the patient were reviewed by me and considered in my medical decision making (see chart for details).  Patient seen and examined. Work-up initiated. Medications ordered.  Reviewed previous evaluations and hospitalizations.  Vital signs reviewed and are as follows: BP (!) 203/110 (BP Location: Left Arm)   Pulse (!) 108   Temp 98.4 F (36.9 C) (Oral)   Resp 18   SpO2 95%   2:09 PM Pt rechecked. States that reglan helped nausea, but continuing to be tearful and in pain. Additional dose of pain medication ordered. Fluids running.   3:27 PM Signout to [employer] at shift change.   Plan: Reassess after additional IV fluids, pain and nausea control.  If not able to transition to oral fluids, may need admission for AKI likely related to dehydration.  Blood pressure and heart rate improving with treatment.  BP (!) 155/75   Pulse 93   Temp 98.4 F (36.9 C) (Oral)   Resp 18   SpO2 96%       MDM Rules/Calculators/A&P                           Pending completion of work-up and treatment.  If pain is controlled and patient is able to be transitioned over to oral fluids, can consider discharged home with symptomatic treatment.  Her symptoms are chronic, intermittent in nature and she presents with her typical symptoms today.  She does have acute kidney injury, likely due to poor oral intake and dehydration.  If she cannot take p.o. fluids well, would need admission for stabilization and trending of creatinine.  Final Clinical Impression(s) / ED Diagnoses Final diagnoses:  Gastroparesis  Chronic abdominal pain    Rx / DC Orders ED Discharge Orders     None        Carlisle Cater, PA-C 11/29/20 [place], Adam, DO 11/30/20 1012

## 2020-11-29 NOTE — ED Triage Notes (Signed)
Patient c/o abdominal pain with N/V x2 days.

## 2020-11-29 NOTE — ED Notes (Signed)
Pt given bottle of water for PO challenge

## 2020-11-29 NOTE — ED Provider Notes (Signed)
Care assumed from [facility], [place], at shift change, please see their notes for full documentation of patient's complaint/HPI. Briefly, pt here with epigastric abdominal pain, nausea, vomiting with hx of gastroparesis. Results so far show stable hgb; slightly elevated creatinine 2.00 (1.37 two weeks ago). Currently receiving additional fluids and pain medication. Plan is to reassess, fluid challenge, determine disposition.   Physical Exam  BP (!) 151/89   Pulse 80   Temp 98.4 F (36.9 C) (Oral)   Resp 18   SpO2 99%   Physical Exam Vitals and nursing note reviewed.  Constitutional:      Appearance: She is not ill-appearing.  HENT:     Head: Normocephalic and atraumatic.  Eyes:     Conjunctiva/sclera: Conjunctivae normal.  Cardiovascular:     Rate and Rhythm: Normal rate and regular rhythm.  Pulmonary:     Effort: Pulmonary effort is normal.     Breath sounds: Normal breath sounds.  Skin:    General: Skin is warm and dry.     Coloration: Skin is not jaundiced.  Neurological:     Mental Status: She is alert.    ED Course/Procedures     Procedures  Results for orders placed or performed during the hospital encounter of 11/29/20  Comprehensive metabolic panel  Result Value Ref Range   Sodium 142 135 - 145 mmol/L   Potassium 4.1 3.5 - 5.1 mmol/L   Chloride 110 98 - 111 mmol/L   CO2 18 (L) 22 - 32 mmol/L   Glucose, Bld 210 (H) 70 - 99 mg/dL   BUN 36 (H) 6 - 20 mg/dL   Creatinine, Ser 2.00 (H) 0.44 - 1.00 mg/dL   Calcium 9.0 8.9 - 10.3 mg/dL   Total Protein 8.3 (H) 6.5 - 8.1 g/dL   Albumin 2.6 (L) 3.5 - 5.0 g/dL   AST 77 (H) 15 - 41 U/L   ALT 21 0 - 44 U/L   Alkaline Phosphatase 223 (H) 38 - 126 U/L   Total Bilirubin 0.8 0.3 - 1.2 mg/dL   GFR, Estimated 34 (L) >60 mL/min   Anion gap 14 5 - 15  Lipase, blood  Result Value Ref Range   Lipase 19 11 - 51 U/L  CBC with Differential  Result Value Ref Range   WBC 10.1 4.0 - 10.5 K/uL   RBC 3.21 (L) 3.87 - 5.11 MIL/uL    Hemoglobin 9.4 (L) 12.0 - 15.0 g/dL   HCT 30.4 (L) 36.0 - 46.0 %   MCV 94.7 80.0 - 100.0 fL   MCH 29.3 26.0 - 34.0 pg   MCHC 30.9 30.0 - 36.0 g/dL   RDW 13.8 11.5 - 15.5 %   Platelets 286 150 - 400 K/uL   nRBC 0.2 0.0 - 0.2 %   Neutrophils Relative % 81 %   Neutro Abs 8.2 (H) 1.7 - 7.7 K/uL   Lymphocytes Relative 12 %   Lymphs Abs 1.2 0.7 - 4.0 K/uL   Monocytes Relative 5 %   Monocytes Absolute 0.5 0.1 - 1.0 K/uL   Eosinophils Relative 0 %   Eosinophils Absolute 0.0 0.0 - 0.5 K/uL   Basophils Relative 1 %   Basophils Absolute 0.1 0.0 - 0.1 K/uL   Immature Granulocytes 1 %   Abs Immature Granulocytes 0.14 (H) 0.00 - 0.07 K/uL  Urinalysis, Routine w reflex microscopic Urine, Clean Catch  Result Value Ref Range   Color, Urine YELLOW (A) YELLOW   APPearance CLEAR (A) CLEAR   Specific  Gravity, Urine 1.025 1.005 - 1.030   pH 6.0 5.0 - 8.0   Glucose, UA 500 (A) NEGATIVE mg/dL   Hgb urine dipstick LARGE (A) NEGATIVE   Bilirubin Urine NEGATIVE NEGATIVE   Ketones, ur TRACE (A) NEGATIVE mg/dL   Protein, ur >300 (A) NEGATIVE mg/dL   Nitrite NEGATIVE NEGATIVE   Leukocytes,Ua NEGATIVE NEGATIVE   RBC / HPF 11-20 0 - 5 RBC/hpf   WBC, UA 0-5 0 - 5 WBC/hpf   Bacteria, UA NONE SEEN NONE SEEN   Squamous Epithelial / LPF 0-5 0 - 5   Mucus PRESENT   hCG, quantitative, pregnancy  Result Value Ref Range   hCG, Beta Chain, Quant, S 1 <5 mIU/mL  CBG monitoring, ED  Result Value Ref Range   Glucose-Capillary 182 (H) 70 - 99 mg/dL  POC CBG, ED  Result Value Ref Range   Glucose-Capillary 211 (H) 70 - 99 mg/dL   Comment 1 Notify RN     MDM  Pt reevaluated after additional IV fluids and pt medication; she is resting comfortably. She has been able to tolerate PO without difficulty. Her HR and BP has decreased with mots recent 80 bpm and 151/89 BP. There is a documented 86% on RA O2 sat however nursing staff reported this was when pt was asleep. On reevaluation her O2 sat at 99%. She denies SOB.  She is stable for discharge home at this time. She does have adequate meds at home for her gastroparesis.   This note was prepared using Dragon voice recognition software and may include unintentional dictation errors due to the inherent limitations of voice recognition software.       Eustaquio Maize, PA-C 11/29/20 1653    Gareth Morgan, MD 11/30/20 [phone]

## 2020-11-29 NOTE — Discharge Instructions (Addendum)
Please follow up with your PCP regarding your ED visit today Take your daily home medications as prescribed  Return to the ED for any new/worsening symptoms

## 2020-11-30 ENCOUNTER — Telehealth: Payer: Self-pay | Admitting: *Deleted

## 2020-11-30 NOTE — Telephone Encounter (Signed)
Transition Care Management Unsuccessful Follow-up Telephone Call  Date of discharge and from where:  11/29/2020 [facility]  Attempts:  1st Attempt  Reason for unsuccessful TCM follow-up call:  No answer/busy

## 2020-12-02 DIAGNOSIS — R112 Nausea with vomiting, unspecified: Secondary | ICD-10-CM | POA: Diagnosis not present

## 2020-12-02 DIAGNOSIS — R1084 Generalized abdominal pain: Secondary | ICD-10-CM | POA: Diagnosis not present

## 2020-12-02 DIAGNOSIS — Z3202 Encounter for pregnancy test, result negative: Secondary | ICD-10-CM | POA: Diagnosis not present

## 2020-12-03 ENCOUNTER — Inpatient Hospital Stay (HOSPITAL_COMMUNITY): Payer: Medicaid Other

## 2020-12-03 ENCOUNTER — Encounter (HOSPITAL_COMMUNITY): Payer: Medicaid Other

## 2020-12-03 ENCOUNTER — Other Ambulatory Visit: Payer: Self-pay

## 2020-12-03 ENCOUNTER — Emergency Department (HOSPITAL_COMMUNITY): Payer: Medicaid Other

## 2020-12-03 ENCOUNTER — Inpatient Hospital Stay (HOSPITAL_COMMUNITY)
Admission: EM | Admit: 2020-12-03 | Discharge: 2020-12-10 | DRG: 637 | Disposition: A | Discharge status: Home Health | Payer: Medicaid Other | Attending: Internal Medicine | Admitting: Internal Medicine

## 2020-12-03 DIAGNOSIS — I161 Hypertensive emergency: Secondary | ICD-10-CM | POA: Diagnosis not present

## 2020-12-03 DIAGNOSIS — R112 Nausea with vomiting, unspecified: Secondary | ICD-10-CM | POA: Diagnosis not present

## 2020-12-03 DIAGNOSIS — G40909 Epilepsy, unspecified, not intractable, without status epilepticus: Secondary | ICD-10-CM | POA: Diagnosis present

## 2020-12-03 DIAGNOSIS — D631 Anemia in chronic kidney disease: Secondary | ICD-10-CM | POA: Diagnosis present

## 2020-12-03 DIAGNOSIS — E440 Moderate protein-calorie malnutrition: Secondary | ICD-10-CM | POA: Insufficient documentation

## 2020-12-03 DIAGNOSIS — E1022 Type 1 diabetes mellitus with diabetic chronic kidney disease: Secondary | ICD-10-CM | POA: Diagnosis present

## 2020-12-03 DIAGNOSIS — E1043 Type 1 diabetes mellitus with diabetic autonomic (poly)neuropathy: Secondary | ICD-10-CM | POA: Diagnosis present

## 2020-12-03 DIAGNOSIS — I959 Hypotension, unspecified: Secondary | ICD-10-CM | POA: Diagnosis not present

## 2020-12-03 DIAGNOSIS — Z79899 Other long term (current) drug therapy: Secondary | ICD-10-CM

## 2020-12-03 DIAGNOSIS — R109 Unspecified abdominal pain: Secondary | ICD-10-CM | POA: Diagnosis not present

## 2020-12-03 DIAGNOSIS — R609 Edema, unspecified: Secondary | ICD-10-CM | POA: Diagnosis not present

## 2020-12-03 DIAGNOSIS — E8770 Fluid overload, unspecified: Secondary | ICD-10-CM | POA: Diagnosis present

## 2020-12-03 DIAGNOSIS — Z6824 Body mass index (BMI) 24.0-24.9, adult: Secondary | ICD-10-CM | POA: Diagnosis not present

## 2020-12-03 DIAGNOSIS — J9601 Acute respiratory failure with hypoxia: Secondary | ICD-10-CM

## 2020-12-03 DIAGNOSIS — N179 Acute kidney failure, unspecified: Secondary | ICD-10-CM

## 2020-12-03 DIAGNOSIS — I517 Cardiomegaly: Secondary | ICD-10-CM | POA: Diagnosis not present

## 2020-12-03 DIAGNOSIS — R188 Other ascites: Secondary | ICD-10-CM | POA: Diagnosis not present

## 2020-12-03 DIAGNOSIS — J9690 Respiratory failure, unspecified, unspecified whether with hypoxia or hypercapnia: Secondary | ICD-10-CM | POA: Diagnosis present

## 2020-12-03 DIAGNOSIS — Z803 Family history of malignant neoplasm of breast: Secondary | ICD-10-CM

## 2020-12-03 DIAGNOSIS — E871 Hypo-osmolality and hyponatremia: Secondary | ICD-10-CM | POA: Diagnosis not present

## 2020-12-03 DIAGNOSIS — E876 Hypokalemia: Secondary | ICD-10-CM | POA: Diagnosis present

## 2020-12-03 DIAGNOSIS — R410 Disorientation, unspecified: Secondary | ICD-10-CM

## 2020-12-03 DIAGNOSIS — E1069 Type 1 diabetes mellitus with other specified complication: Secondary | ICD-10-CM

## 2020-12-03 DIAGNOSIS — Z9049 Acquired absence of other specified parts of digestive tract: Secondary | ICD-10-CM

## 2020-12-03 DIAGNOSIS — R4182 Altered mental status, unspecified: Secondary | ICD-10-CM | POA: Diagnosis not present

## 2020-12-03 DIAGNOSIS — I6783 Posterior reversible encephalopathy syndrome: Secondary | ICD-10-CM

## 2020-12-03 DIAGNOSIS — R079 Chest pain, unspecified: Secondary | ICD-10-CM | POA: Diagnosis not present

## 2020-12-03 DIAGNOSIS — Z452 Encounter for adjustment and management of vascular access device: Secondary | ICD-10-CM | POA: Diagnosis not present

## 2020-12-03 DIAGNOSIS — Z833 Family history of diabetes mellitus: Secondary | ICD-10-CM

## 2020-12-03 DIAGNOSIS — R0602 Shortness of breath: Secondary | ICD-10-CM | POA: Diagnosis not present

## 2020-12-03 DIAGNOSIS — K3184 Gastroparesis: Secondary | ICD-10-CM | POA: Diagnosis not present

## 2020-12-03 DIAGNOSIS — U071 COVID-19: Secondary | ICD-10-CM | POA: Diagnosis not present

## 2020-12-03 DIAGNOSIS — E1010 Type 1 diabetes mellitus with ketoacidosis without coma: Principal | ICD-10-CM | POA: Diagnosis present

## 2020-12-03 DIAGNOSIS — E10649 Type 1 diabetes mellitus with hypoglycemia without coma: Secondary | ICD-10-CM | POA: Diagnosis not present

## 2020-12-03 DIAGNOSIS — R451 Restlessness and agitation: Secondary | ICD-10-CM

## 2020-12-03 DIAGNOSIS — J1282 Pneumonia due to coronavirus disease 2019: Secondary | ICD-10-CM | POA: Diagnosis not present

## 2020-12-03 DIAGNOSIS — J9811 Atelectasis: Secondary | ICD-10-CM | POA: Diagnosis not present

## 2020-12-03 DIAGNOSIS — I129 Hypertensive chronic kidney disease with stage 1 through stage 4 chronic kidney disease, or unspecified chronic kidney disease: Secondary | ICD-10-CM | POA: Diagnosis present

## 2020-12-03 DIAGNOSIS — R569 Unspecified convulsions: Secondary | ICD-10-CM | POA: Diagnosis not present

## 2020-12-03 DIAGNOSIS — Z89512 Acquired absence of left leg below knee: Secondary | ICD-10-CM

## 2020-12-03 DIAGNOSIS — E739 Lactose intolerance, unspecified: Secondary | ICD-10-CM | POA: Diagnosis present

## 2020-12-03 DIAGNOSIS — N1832 Chronic kidney disease, stage 3b: Secondary | ICD-10-CM | POA: Diagnosis present

## 2020-12-03 DIAGNOSIS — R601 Generalized edema: Secondary | ICD-10-CM | POA: Diagnosis not present

## 2020-12-03 DIAGNOSIS — G928 Other toxic encephalopathy: Secondary | ICD-10-CM | POA: Diagnosis not present

## 2020-12-03 DIAGNOSIS — Z91018 Allergy to other foods: Secondary | ICD-10-CM

## 2020-12-03 DIAGNOSIS — Z3202 Encounter for pregnancy test, result negative: Secondary | ICD-10-CM | POA: Diagnosis not present

## 2020-12-03 DIAGNOSIS — Z800 Family history of malignant neoplasm of digestive organs: Secondary | ICD-10-CM

## 2020-12-03 DIAGNOSIS — J189 Pneumonia, unspecified organism: Secondary | ICD-10-CM | POA: Diagnosis not present

## 2020-12-03 DIAGNOSIS — R1084 Generalized abdominal pain: Secondary | ICD-10-CM | POA: Diagnosis not present

## 2020-12-03 DIAGNOSIS — K219 Gastro-esophageal reflux disease without esophagitis: Secondary | ICD-10-CM | POA: Diagnosis present

## 2020-12-03 DIAGNOSIS — F1210 Cannabis abuse, uncomplicated: Secondary | ICD-10-CM | POA: Diagnosis present

## 2020-12-03 DIAGNOSIS — R000 Tachycardia, unspecified: Secondary | ICD-10-CM | POA: Diagnosis not present

## 2020-12-03 DIAGNOSIS — Z794 Long term (current) use of insulin: Secondary | ICD-10-CM

## 2020-12-03 DIAGNOSIS — Z89511 Acquired absence of right leg below knee: Secondary | ICD-10-CM

## 2020-12-03 DIAGNOSIS — Z8249 Family history of ischemic heart disease and other diseases of the circulatory system: Secondary | ICD-10-CM

## 2020-12-03 DIAGNOSIS — R7989 Other specified abnormal findings of blood chemistry: Secondary | ICD-10-CM | POA: Diagnosis not present

## 2020-12-03 LAB — GLUCOSE, CAPILLARY
Glucose-Capillary: 105 mg/dL — ABNORMAL HIGH (ref 70–99)
Glucose-Capillary: 107 mg/dL — ABNORMAL HIGH (ref 70–99)
Glucose-Capillary: 107 mg/dL — ABNORMAL HIGH (ref 70–99)
Glucose-Capillary: 112 mg/dL — ABNORMAL HIGH (ref 70–99)
Glucose-Capillary: 119 mg/dL — ABNORMAL HIGH (ref 70–99)
Glucose-Capillary: 128 mg/dL — ABNORMAL HIGH (ref 70–99)
Glucose-Capillary: 135 mg/dL — ABNORMAL HIGH (ref 70–99)
Glucose-Capillary: 68 mg/dL — ABNORMAL LOW (ref 70–99)
Glucose-Capillary: 82 mg/dL (ref 70–99)
Glucose-Capillary: 93 mg/dL (ref 70–99)

## 2020-12-03 LAB — BLOOD GAS, ARTERIAL
Acid-base deficit: 2.8 mmol/L — ABNORMAL HIGH (ref 0.0–2.0)
Bicarbonate: 19.8 mmol/L — ABNORMAL LOW (ref 20.0–28.0)
Drawn by: 11249
FIO2: 100
MECHVT: 500 mL
O2 Saturation: 100 %
PEEP: 5 cmH2O
Patient temperature: 98.1
RATE: 16 resp/min
pCO2 arterial: 29.6 mmHg — ABNORMAL LOW (ref 32.0–48.0)
pH, Arterial: 7.439 (ref 7.350–7.450)
pO2, Arterial: 497 mmHg — ABNORMAL HIGH (ref 83.0–108.0)

## 2020-12-03 LAB — BASIC METABOLIC PANEL
Anion gap: 13 (ref 5–15)
Anion gap: 11 (ref 5–15)
Anion gap: 5 (ref 5–15)
BUN: 25 mg/dL — ABNORMAL HIGH (ref 6–20)
BUN: 21 mg/dL — ABNORMAL HIGH (ref 6–20)
BUN: 22 mg/dL — ABNORMAL HIGH (ref 6–20)
CO2: 18 mmol/L — ABNORMAL LOW (ref 22–32)
CO2: 23 mmol/L (ref 22–32)
CO2: 24 mmol/L (ref 22–32)
Calcium: 8 mg/dL — ABNORMAL LOW (ref 8.9–10.3)
Calcium: 7.9 mg/dL — ABNORMAL LOW (ref 8.9–10.3)
Calcium: 7.5 mg/dL — ABNORMAL LOW (ref 8.9–10.3)
Chloride: 109 mmol/L (ref 98–111)
Chloride: 105 mmol/L (ref 98–111)
Chloride: 110 mmol/L (ref 98–111)
Creatinine, Ser: 1.56 mg/dL — ABNORMAL HIGH (ref 0.44–1.00)
Creatinine, Ser: 1.58 mg/dL — ABNORMAL HIGH (ref 0.44–1.00)
Creatinine, Ser: 1.46 mg/dL — ABNORMAL HIGH (ref 0.44–1.00)
GFR, Estimated: 45 mL/min — ABNORMAL LOW (ref >60)
GFR, Estimated: 45 mL/min — ABNORMAL LOW (ref >60)
GFR, Estimated: 49 mL/min — ABNORMAL LOW (ref >60)
Glucose, Bld: 323 mg/dL — ABNORMAL HIGH (ref 70–99)
Glucose, Bld: 111 mg/dL — ABNORMAL HIGH (ref 70–99)
Glucose, Bld: 124 mg/dL — ABNORMAL HIGH (ref 70–99)
Potassium: 2.8 mmol/L — ABNORMAL LOW (ref 3.5–5.1)
Potassium: 2.9 mmol/L — ABNORMAL LOW (ref 3.5–5.1)
Potassium: 3 mmol/L — ABNORMAL LOW (ref 3.5–5.1)
Sodium: 140 mmol/L (ref 135–145)
Sodium: 139 mmol/L (ref 135–145)
Sodium: 139 mmol/L (ref 135–145)

## 2020-12-03 LAB — CBC WITH DIFFERENTIAL/PLATELET
Abs Immature Granulocytes: 0.04 10*3/uL (ref 0.00–0.07)
Basophils Absolute: 0.1 10*3/uL (ref 0.0–0.1)
Basophils Relative: 1 %
Eosinophils Absolute: 0 10*3/uL (ref 0.0–0.5)
Eosinophils Relative: 0 %
HCT: 34.4 % — ABNORMAL LOW (ref 36.0–46.0)
Hemoglobin: 10.9 g/dL — ABNORMAL LOW (ref 12.0–15.0)
Immature Granulocytes: 0 %
Lymphocytes Relative: 19 %
Lymphs Abs: 1.9 10*3/uL (ref 0.7–4.0)
MCH: 29.1 pg (ref 26.0–34.0)
MCHC: 31.7 g/dL (ref 30.0–36.0)
MCV: 92 fL (ref 80.0–100.0)
Monocytes Absolute: 0.7 10*3/uL (ref 0.1–1.0)
Monocytes Relative: 6 %
Neutro Abs: 7.4 10*3/uL (ref 1.7–7.7)
Neutrophils Relative %: 74 %
Platelets: 309 10*3/uL (ref 150–400)
RBC: 3.74 MIL/uL — ABNORMAL LOW (ref 3.87–5.11)
RDW: 14.5 % (ref 11.5–15.5)
WBC: 10.1 10*3/uL (ref 4.0–10.5)
nRBC: 0 % (ref 0.0–0.2)

## 2020-12-03 LAB — COMPREHENSIVE METABOLIC PANEL
ALT: 11 U/L (ref 0–44)
AST: 27 U/L (ref 15–41)
Albumin: 2.5 g/dL — ABNORMAL LOW (ref 3.5–5.0)
Alkaline Phosphatase: 155 U/L — ABNORMAL HIGH (ref 38–126)
Anion gap: 18 — ABNORMAL HIGH (ref 5–15)
BUN: 23 mg/dL — ABNORMAL HIGH (ref 6–20)
CO2: 18 mmol/L — ABNORMAL LOW (ref 22–32)
Calcium: 8.8 mg/dL — ABNORMAL LOW (ref 8.9–10.3)
Chloride: 106 mmol/L (ref 98–111)
Creatinine, Ser: 1.45 mg/dL — ABNORMAL HIGH (ref 0.44–1.00)
GFR, Estimated: 49 mL/min — ABNORMAL LOW (ref >60)
Glucose, Bld: 314 mg/dL — ABNORMAL HIGH (ref 70–99)
Potassium: 3.2 mmol/L — ABNORMAL LOW (ref 3.5–5.1)
Sodium: 142 mmol/L (ref 135–145)
Total Bilirubin: 1.1 mg/dL (ref 0.3–1.2)
Total Protein: 7.4 g/dL (ref 6.5–8.1)

## 2020-12-03 LAB — I-STAT BETA HCG BLOOD, ED (MC, WL, AP ONLY): I-stat hCG, quantitative: <5 m[IU]/mL (ref <5)

## 2020-12-03 LAB — MRSA NEXT GEN BY PCR, NASAL: MRSA by PCR Next Gen: NOT DETECTED

## 2020-12-03 LAB — RAPID URINE DRUG SCREEN, HOSP PERFORMED
Amphetamines: NOT DETECTED
Barbiturates: NOT DETECTED
Benzodiazepines: NOT DETECTED
Cocaine: NOT DETECTED
Opiates: NOT DETECTED
Tetrahydrocannabinol: POSITIVE — AB

## 2020-12-03 LAB — CBC
HCT: 28.2 % — ABNORMAL LOW (ref 36.0–46.0)
Hemoglobin: 9.2 g/dL — ABNORMAL LOW (ref 12.0–15.0)
MCH: 29.4 pg (ref 26.0–34.0)
MCHC: 32.6 g/dL (ref 30.0–36.0)
MCV: 90.1 fL (ref 80.0–100.0)
Platelets: 196 10*3/uL (ref 150–400)
RBC: 3.13 MIL/uL — ABNORMAL LOW (ref 3.87–5.11)
RDW: 14.3 % (ref 11.5–15.5)
WBC: 7.4 10*3/uL (ref 4.0–10.5)
nRBC: 0 % (ref 0.0–0.2)

## 2020-12-03 LAB — SALICYLATE LEVEL: Salicylate Lvl: <7 mg/dL — ABNORMAL LOW (ref 7.0–30.0)

## 2020-12-03 LAB — CBG MONITORING, ED
Glucose-Capillary: 255 mg/dL — ABNORMAL HIGH (ref 70–99)
Glucose-Capillary: 270 mg/dL — ABNORMAL HIGH (ref 70–99)
Glucose-Capillary: 285 mg/dL — ABNORMAL HIGH (ref 70–99)
Glucose-Capillary: 221 mg/dL — ABNORMAL HIGH (ref 70–99)
Glucose-Capillary: 185 mg/dL — ABNORMAL HIGH (ref 70–99)

## 2020-12-03 LAB — BETA-HYDROXYBUTYRIC ACID
Beta-Hydroxybutyric Acid: 5.77 mmol/L — ABNORMAL HIGH (ref 0.05–0.27)
Beta-Hydroxybutyric Acid: 0.64 mmol/L — ABNORMAL HIGH (ref 0.05–0.27)

## 2020-12-03 LAB — TROPONIN I (HIGH SENSITIVITY)
Troponin I (High Sensitivity): 14 ng/L (ref <18)
Troponin I (High Sensitivity): 17 ng/L (ref <18)

## 2020-12-03 LAB — PROTIME-INR
INR: 1.3 — ABNORMAL HIGH (ref 0.8–1.2)
Prothrombin Time: 15.7 seconds — ABNORMAL HIGH (ref 11.4–15.2)

## 2020-12-03 LAB — LACTIC ACID, PLASMA: Lactic Acid, Venous: 1 mmol/L (ref 0.5–1.9)

## 2020-12-03 LAB — CK: Total CK: 93 U/L (ref 38–234)

## 2020-12-03 LAB — ETHANOL: Alcohol, Ethyl (B): <10 mg/dL (ref <10)

## 2020-12-03 LAB — ACETAMINOPHEN LEVEL: Acetaminophen (Tylenol), Serum: <10 ug/mL — ABNORMAL LOW (ref 10–30)

## 2020-12-03 IMAGING — DX DG CHEST 1V PORT
1 series · 1 of 1 positions shown · non-contrast
Comparison: [DATE] chest radiograph.

CLINICAL DATA: Intubated, seizure

EXAM:
PORTABLE CHEST 1 VIEW

[chest ap]
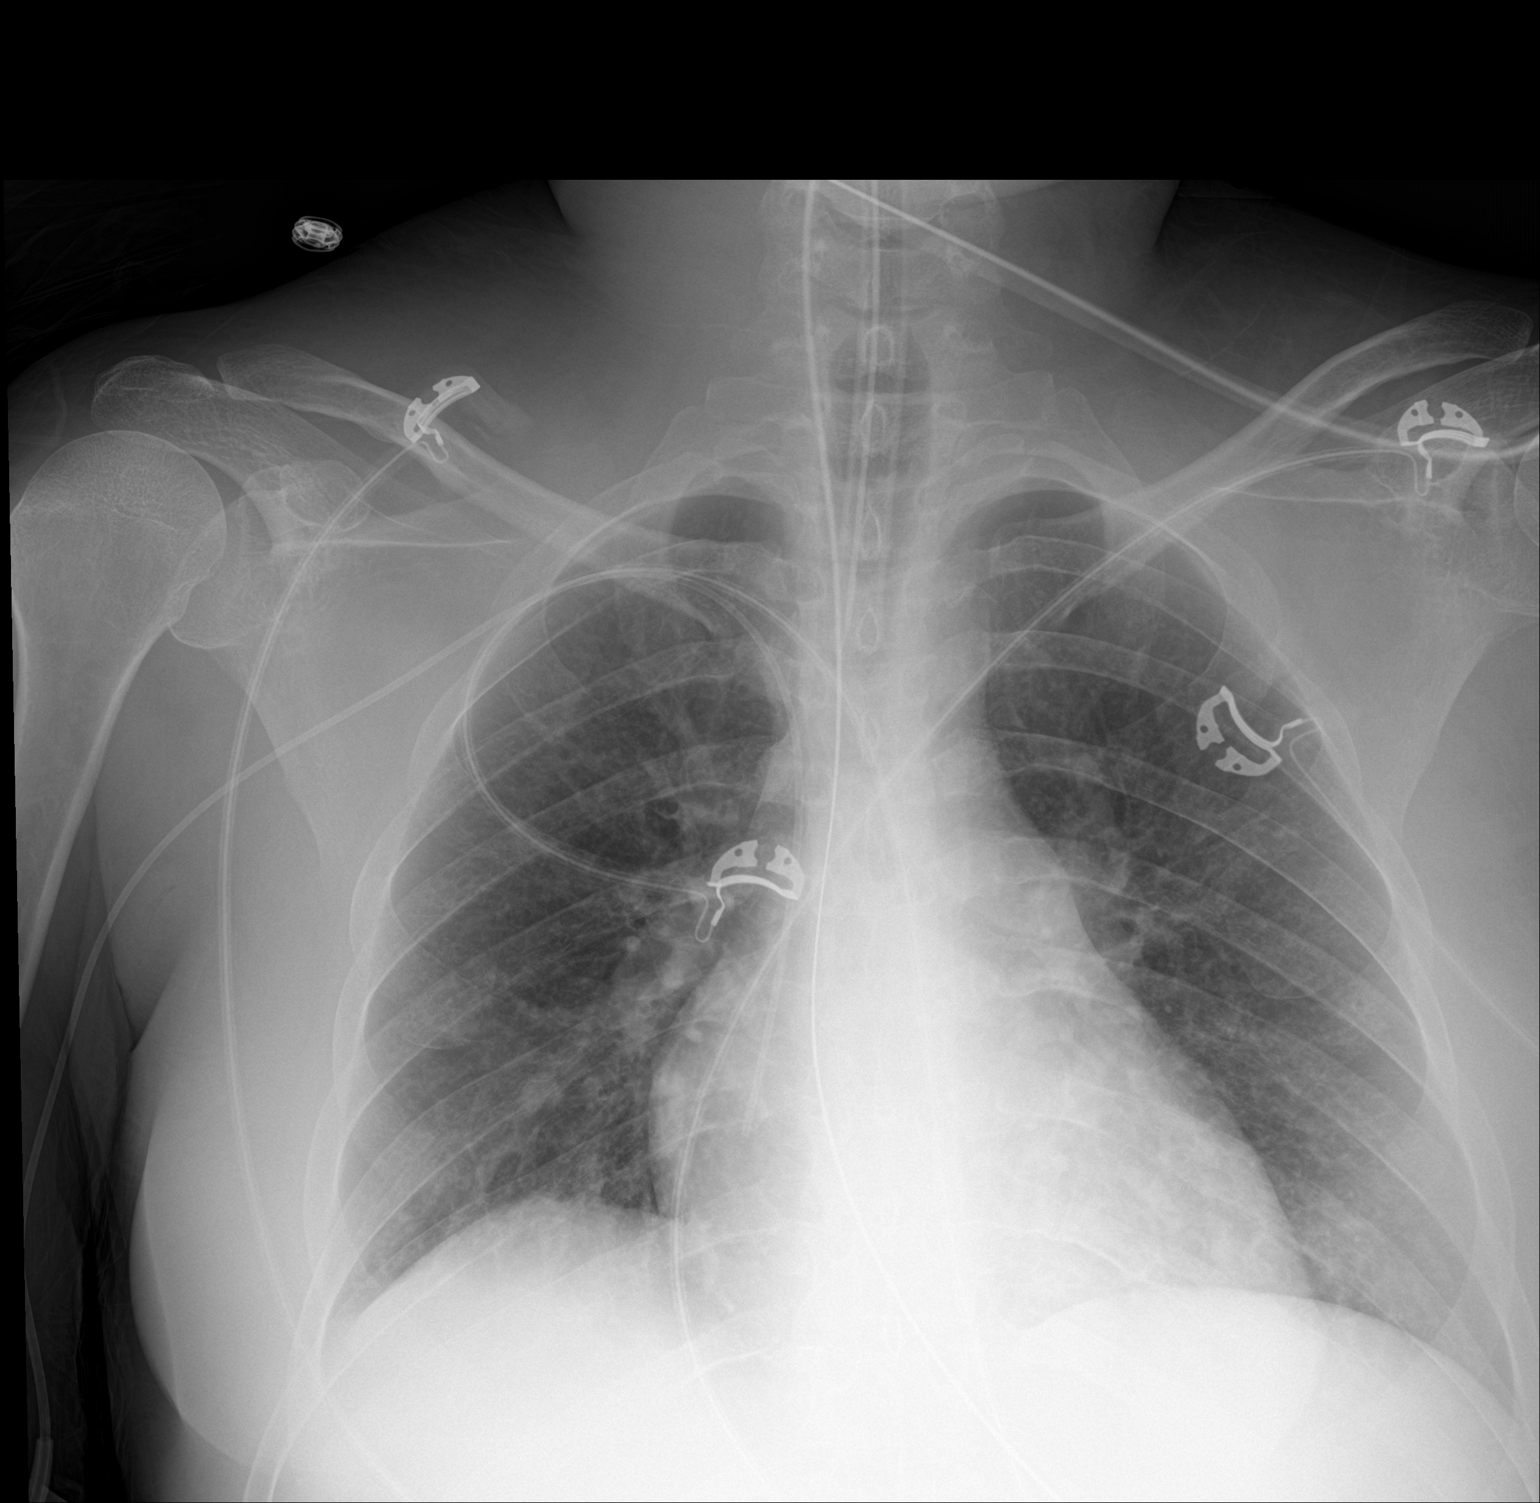

[1 of 1 positions shown; findings below may reference images not displayed]

FINDINGS: Endotracheal tube tip is 4.3 cm above the carina. Enteric tube
enters stomach with the tip not seen on this image. Right PICC
terminates at the cavoatrial junction. Stable cardiomediastinal
silhouette with top-normal heart size. No pneumothorax. No pleural
effusion. Mild hazy patchy bibasilar lung opacities. No pulmonary
edema.
IMPRESSION: 1. Well-positioned support structures.
2. Mild hazy patchy bibasilar lung opacities, suspect atelectasis or
aspiration.

## 2020-12-03 IMAGING — CT CT HEAD W/O CM
3 series · 16 of 47 positions shown, 19 images · non-contrast
Comparison: [DATE]

CLINICAL DATA: Seizure

EXAM:
CT HEAD WITHOUT CONTRAST
TECHNIQUE: Contiguous axial images were obtained from the base of the skull
through the vertex without intravenous contrast.

[Series 2: head wo · axial · 0.45mm/px · z∈[-129,-4]mm · 10 of 31 slices shown, 13 images]
[im 3/31  brain]
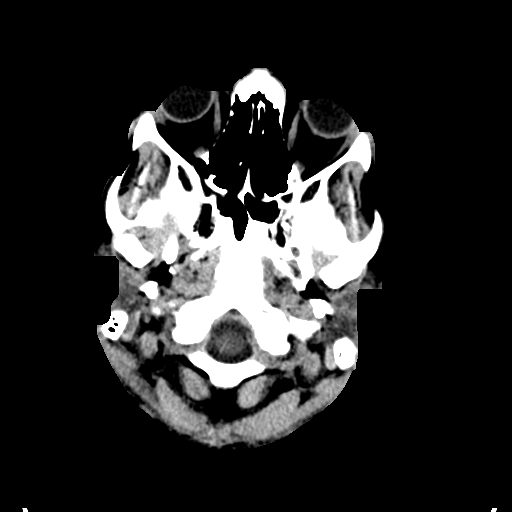
[im 3/31  bone]
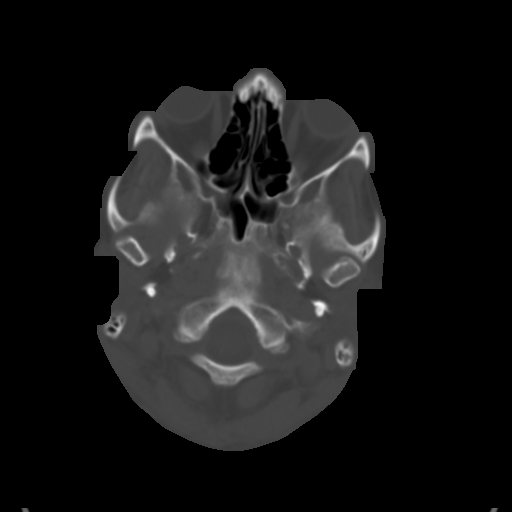
[im 6/31  brain]
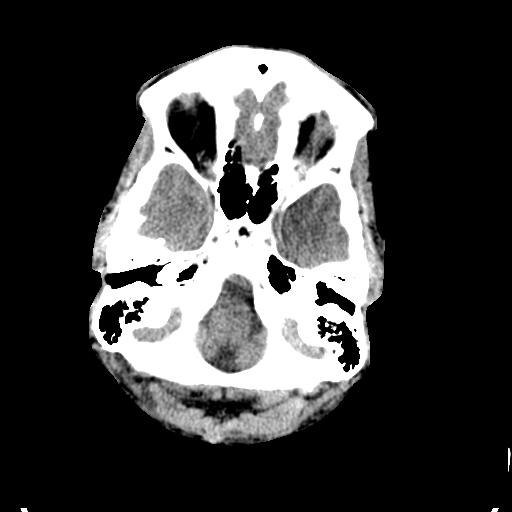
[im 9/31  brain]
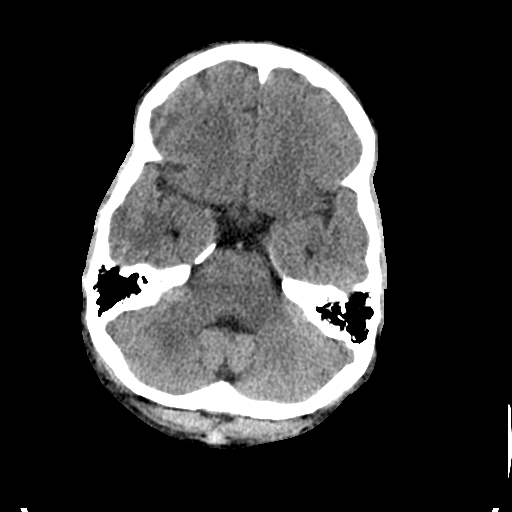
[im 11/31  brain]
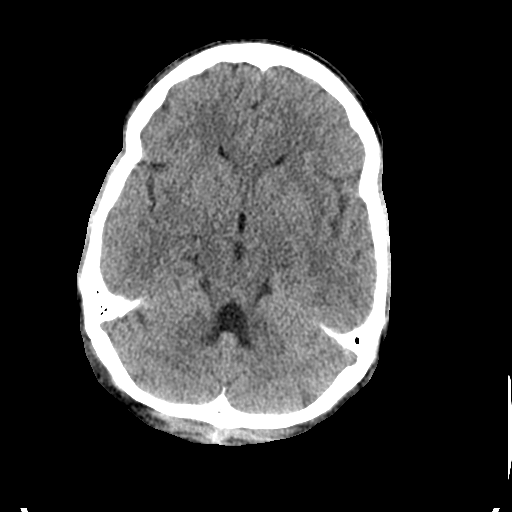
[im 14/31  brain]
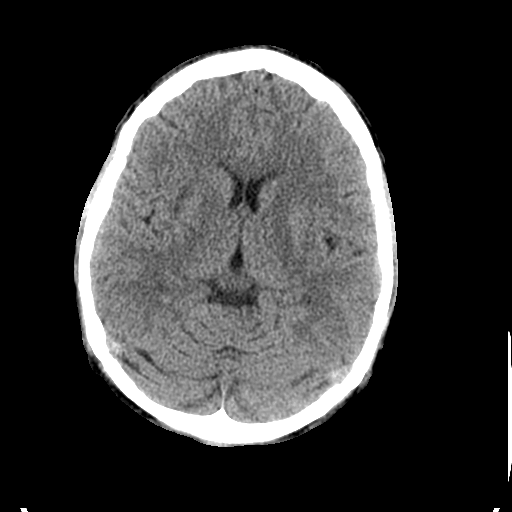
[im 14/31  bone]
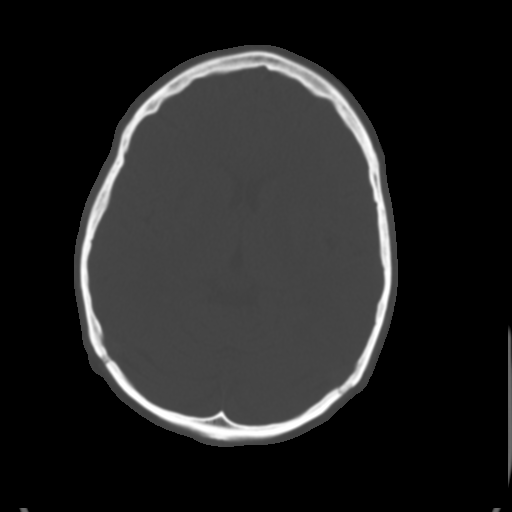
[im 17/31  brain]
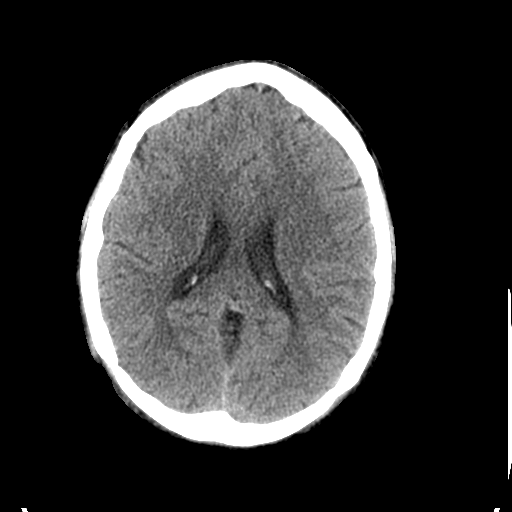
[im 20/31  brain]
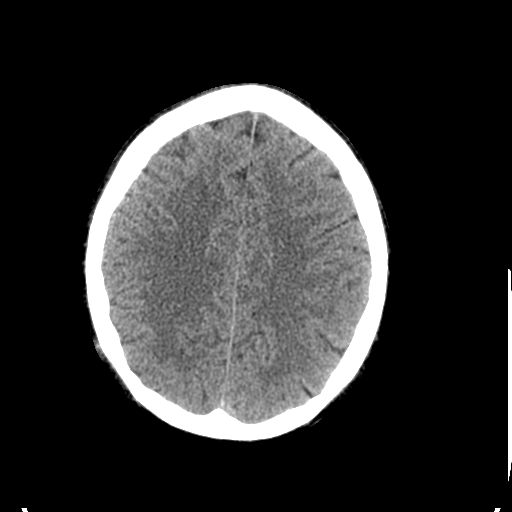
[im 23/31  brain]
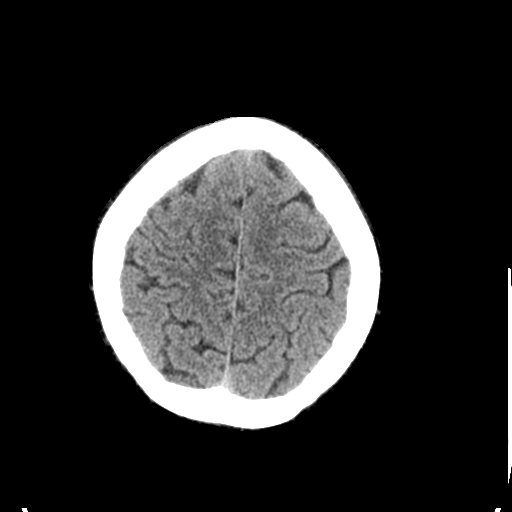
[im 25/31  brain]
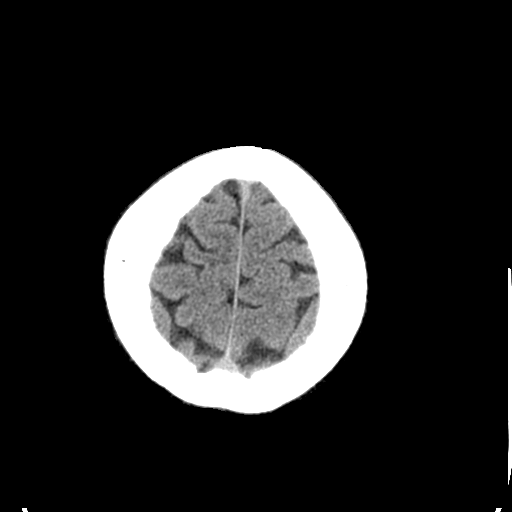
[im 25/31  bone]
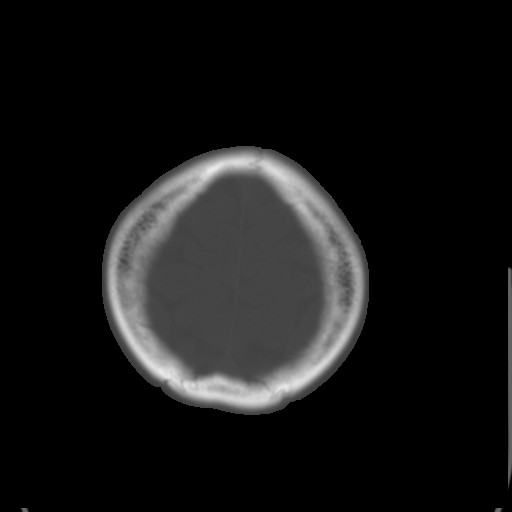
[im 28/31  brain]
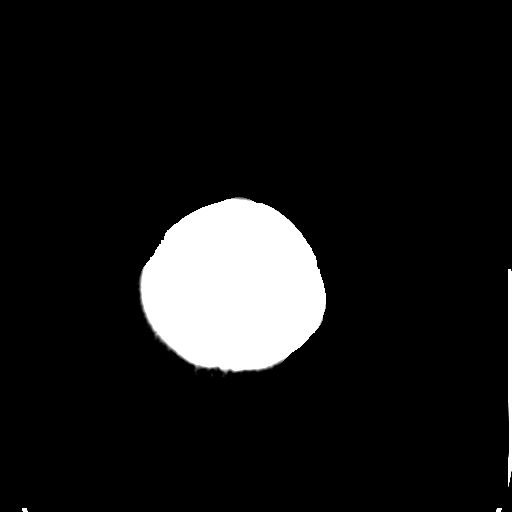

[Series 5: coronal soft tissue · coronal · 0.32mm/px · 3 of 68 slices shown]
[im 23/68  brain]
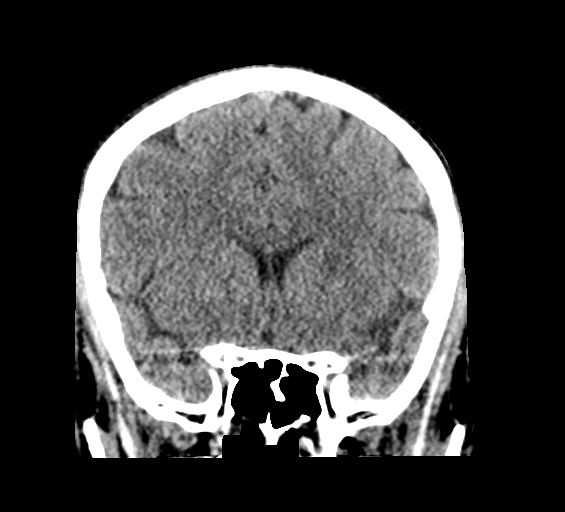
[im 30/68  brain]
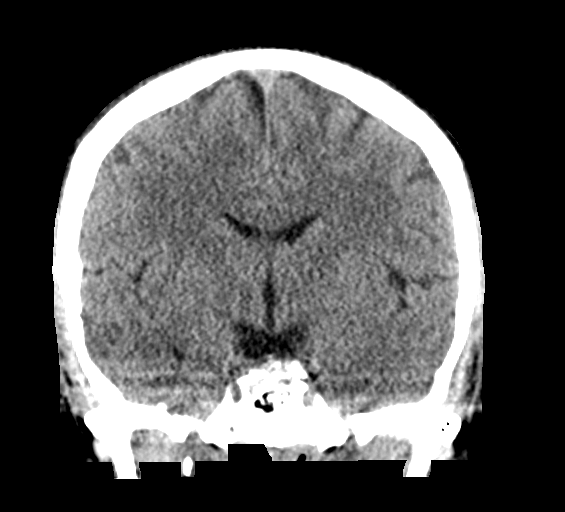
[im 38/68  brain]
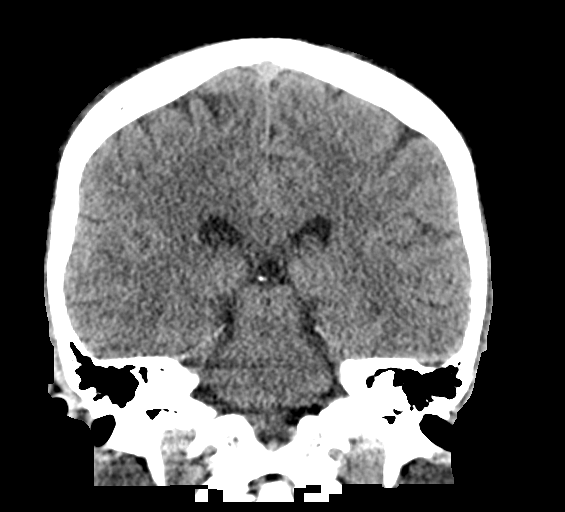

[Series 6: sagittal soft tissue · sagittal · 0.36mm/px · 3 of 58 slices shown]
[im 20/58  brain]
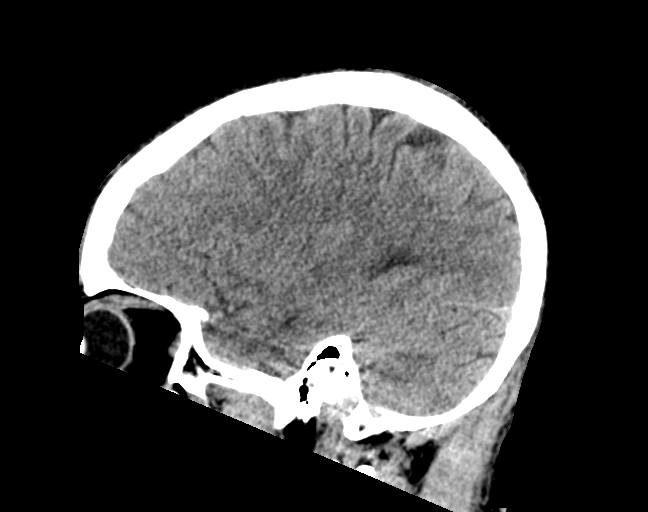
[im 29/58  brain]
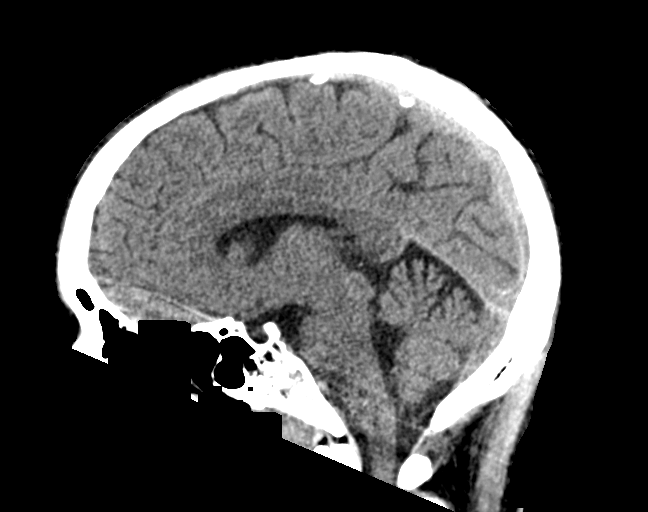
[im 39/58  brain]
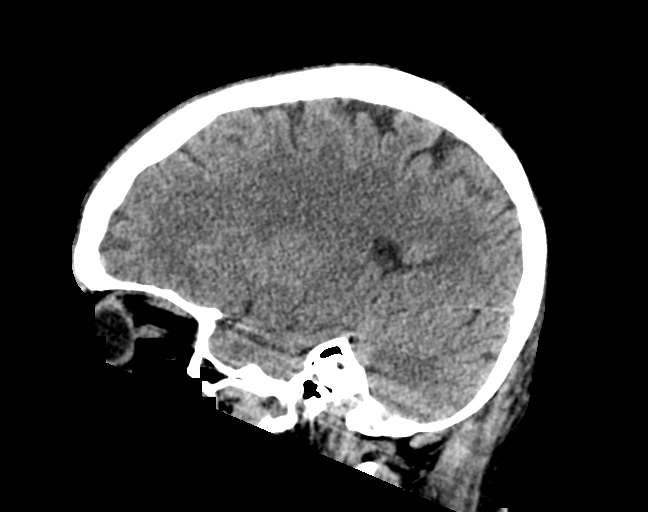

[16 of 47 positions shown; findings below may reference images not displayed]

FINDINGS: Brain: No evidence of acute infarction, hemorrhage, hydrocephalus,
extra-axial collection or mass lesion/mass effect.

Vascular: No hyperdense vessel or unexpected calcification.

Skull: Normal. Negative for fracture or focal lesion.

Sinuses/Orbits: No acute finding.
IMPRESSION: Stable and negative head CT.

## 2020-12-03 IMAGING — MR MR HEAD W/O CM
16 series · 41 of 48 positions shown · non-contrast
Comparison: Prior head CT examinations [DATE] and earlier.

CLINICAL DATA: Seizure, abnormal neuro exam.

EXAM:
MRI HEAD WITHOUT CONTRAST
TECHNIQUE: Multiplanar, multiecho pulse sequences of the brain and surrounding
structures were obtained without intravenous contrast.

[Series 9: DWI · axial · 3.0mm · 0.88mm/px · z∈[-151,-0]mm · 6 of 104 slices shown (1 of 4)]
[im 1/104]
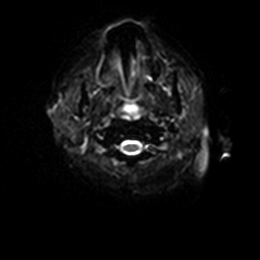
[im 21/104]
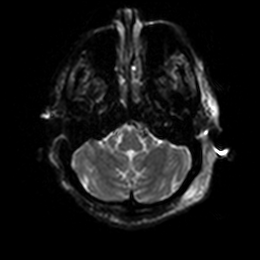
[im 42/104]
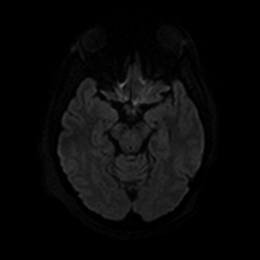
[im 62/104]
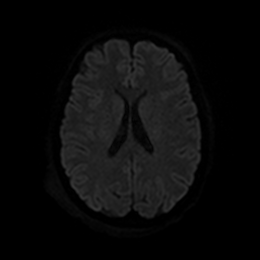
[im 83/104]
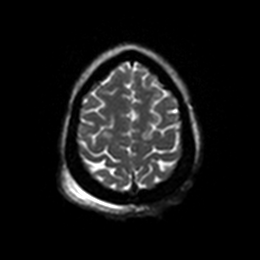
[im 104/104]
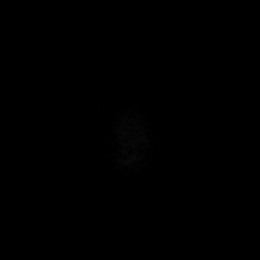

[Series 10: DWI · axial · 3.0mm · 0.88mm/px · z∈[-151,-0]mm · 2 of 51 slices shown (2 of 4)]
[im 1/51]
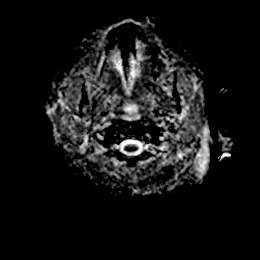
[im 51/51]
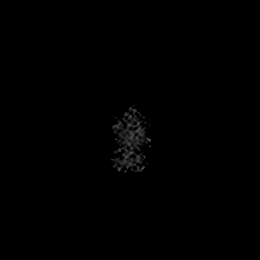

[Series 11: DWI · coronal · 4.0mm · 0.88mm/px · 4 of 74 slices shown (3 of 4)]
[im 1/74]
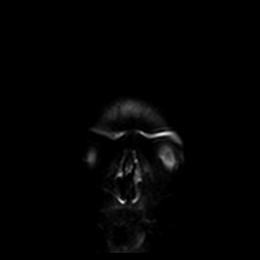
[im 25/74]
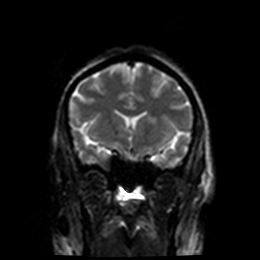
[im 49/74]
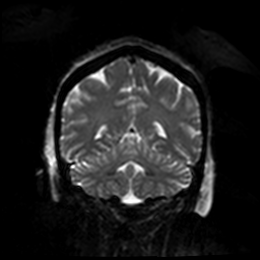
[im 74/74]
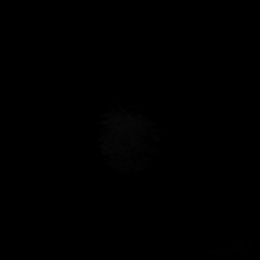

[Series 12: DWI · coronal · 4.0mm · 0.88mm/px · 2 of 37 slices shown (4 of 4)]
[im 1/37]
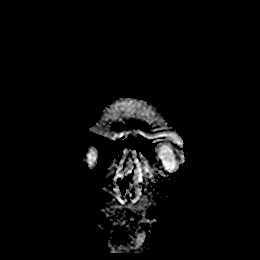
[im 37/37]
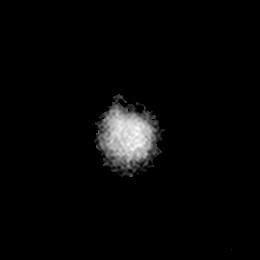

[Series 13: T1 · sagittal · 5.0mm · 0.75mm/px · 1 of 25 slices shown]
[im 1/25]
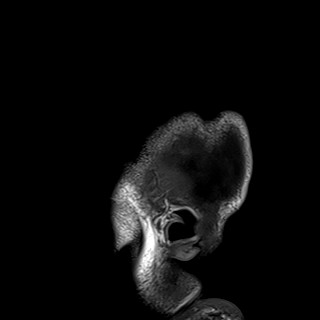

[Series 14: T2 · axial · 5.0mm · 0.72mm/px · 1 of 25 slices shown (1 of 3)]
[im 1/25]
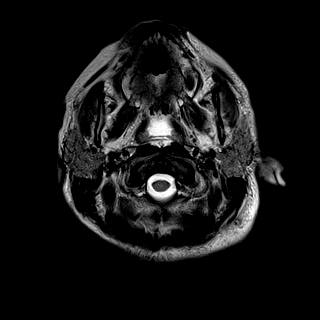

[Series 15: FLAIR · axial · 5.0mm · 0.45mm/px · 1 of 25 slices shown (1 of 2)]
[im 1/25]
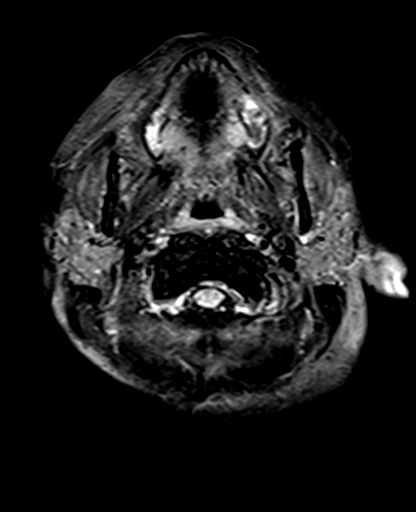

[Series 16: mag_images · axial · 3.0mm · 0.90mm/px · z∈[-148,+3]mm · 2 of 52 slices shown]
[im 1/52]
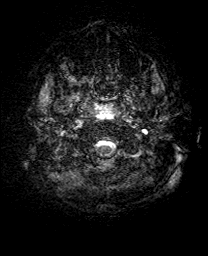
[im 52/52]
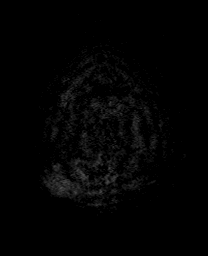

[Series 17: pha_images · axial · 3.0mm · 0.90mm/px · z∈[-148,+3]mm · 2 of 52 slices shown]
[im 1/52]
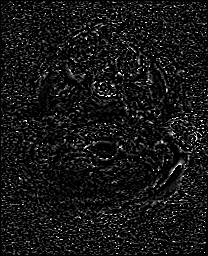
[im 52/52]
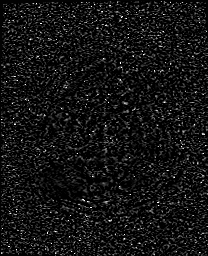

[Series 18: swi_images · axial · 3.0mm · 0.90mm/px · z∈[-148,+3]mm · 2 of 52 slices shown]
[im 1/52]
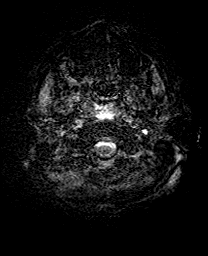
[im 52/52]
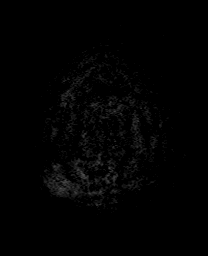

[Series 19: mip_images(sw) · axial · 24.0mm · 0.90mm/px · z∈[-138,-8]mm · 2 of 45 slices shown]
[im 1/45]
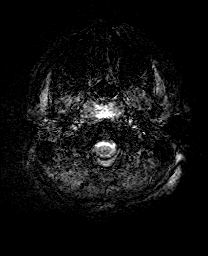
[im 45/45]
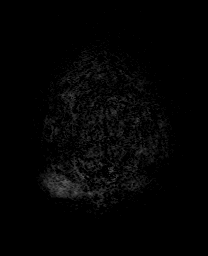

[Series 20: t1_mprage_tra_p2_iso · axial · 1.0mm · 0.98mm/px · z∈[-155,+2]mm · 8 of 160 slices shown]
[im 1/160]
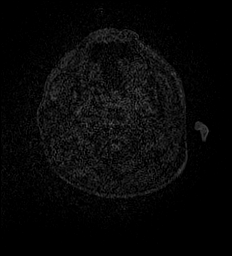
[im 23/160]
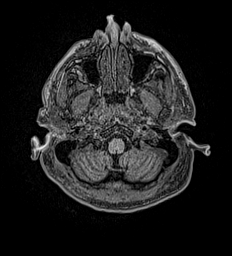
[im 46/160]
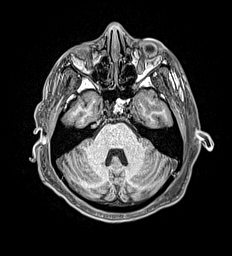
[im 69/160]
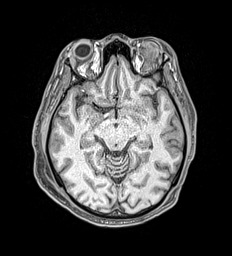
[im 91/160]
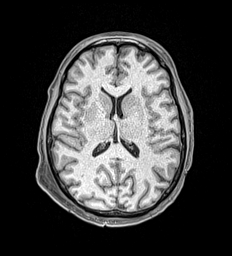
[im 114/160]
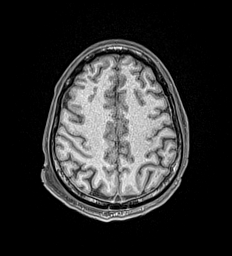
[im 137/160]
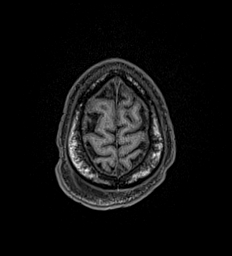
[im 160/160]
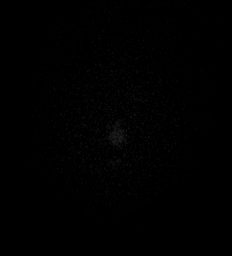

[Series 21: t1_mprage_tra_p2_iso_mpr_coronal · coronal · 1.0mm · 0.45mm/px · 3 of 201 slices shown]
[im 1/201]
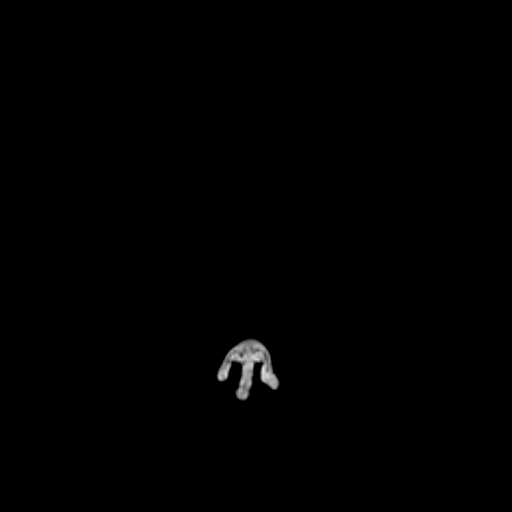
[im 23/201]
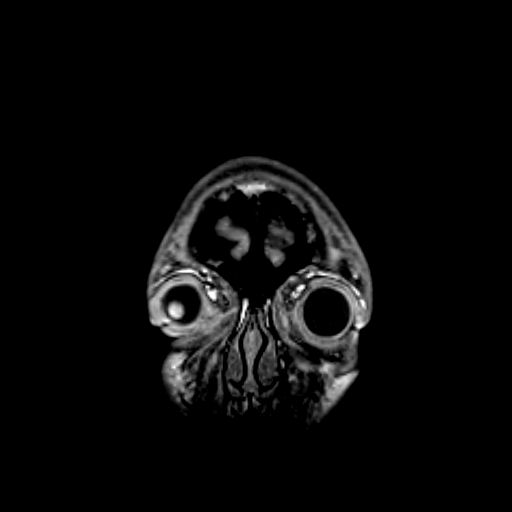
[im 67/201]
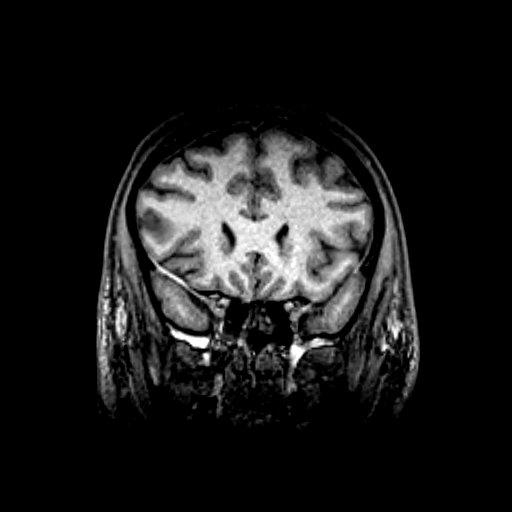

[Series 22: T2 · coronal · 3.0mm · 0.27mm/px · 2 of 38 slices shown (2 of 3)]
[im 1/38]
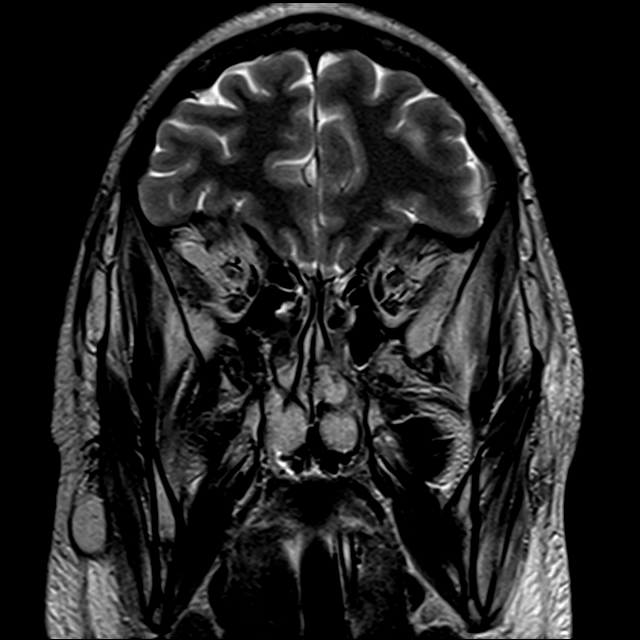
[im 38/38]
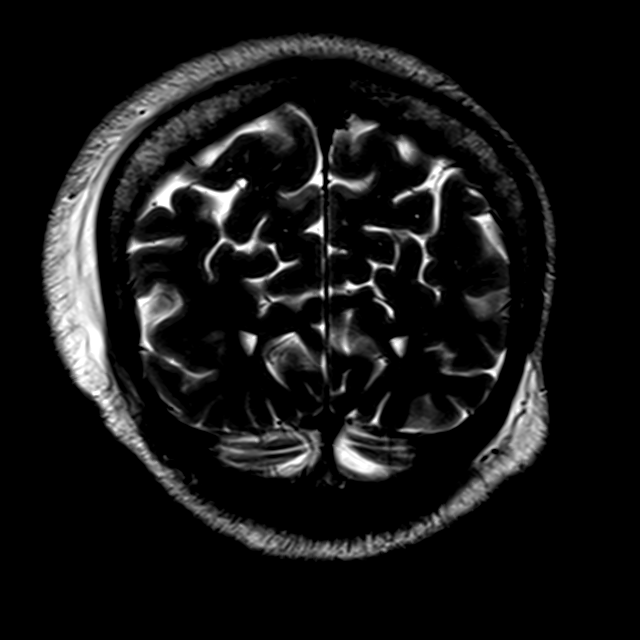

[Series 23: FLAIR · coronal · 3.0mm · 0.56mm/px · 2 of 38 slices shown (2 of 2)]
[im 1/38]
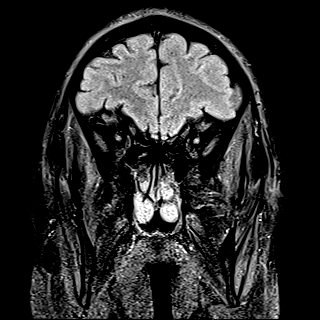
[im 38/38]
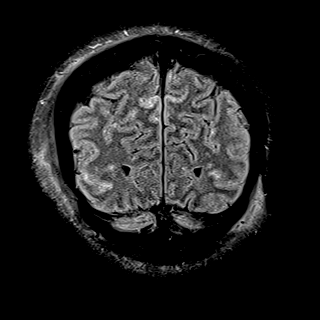

[Series 24: T2 · coronal · 5.0mm · 0.34mm/px · 1 of 31 slices shown (3 of 3)]
[im 1/31]
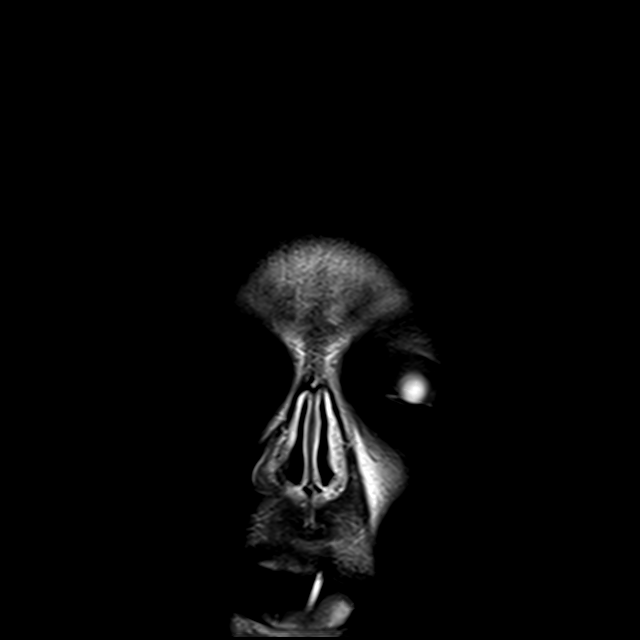

[41 of 48 positions shown; findings below may reference images not displayed]

FINDINGS: Brain:

Cerebral volume is normal for age.

There is fairly symmetric patchy cortical/subcortical T2/FLAIR
hyperintense signal abnormality within the bilateral frontal lobes,
parietooccipital lobes and posterior temporal lobes. Additionally,
there are patchy foci of T2/FLAIR hyperintense signal abnormality
within the posterior limbs of internal capsules and bilateral basal
ganglia bilaterally. These findings are nonspecific but suspicious
for posterior reversible encephalopathy syndrome (PRES). Alternative
etiologies (including cerebritis) can not be excluded.

The hippocampi are symmetric in size and signal.

There is no acute infarct.

No evidence of an intracranial mass.

No chronic intracranial blood products.

No extra-axial fluid collection.

No midline shift.

Vascular: Maintained flow voids within the proximal large arterial
vessels.

Skull and upper cervical spine: No focal suspicious marrow lesion.

Sinuses/Orbits: Visualized orbits show no acute finding. Trace
bilateral ethmoid sinus mucosal thickening.

Other: Posterior scalp edema bilaterally.

These results will be called to the ordering clinician or
representative by the Radiologist Assistant, and communication
documented in the PACS or [REDACTED].
IMPRESSION: Fairly symmetric patchy cortical/subcortical T2/FLAIR hyperintense
signal abnormality within the bilateral frontal lobes,
parietooccipital lobes and posterior temporal lobes. Additional
patchy foci of T2/FLAIR hyperintense signal abnormality within the
posterior limbs of the internal capsules and bilateral basal ganglia
bilaterally. These findings are nonspecific but favored to reflect
posterior reversible encephalopathy syndrome (PRES). Alternative
etiologies (including cerebritis) can not be excluded. Clinical
correlation is recommended. Additionally, a follow-up brain MRI is
recommended to assess for stability, resolution or progression of
these signal changes.

## 2020-12-03 IMAGING — CT CT ABD-PELV W/O CM
2 of 4 series · 13 of 36 positions shown, 16 images · non-contrast
Comparison: [DATE] unenhanced CT abdomen/pelvis. [DATE]
chest CT angiogram.

CLINICAL DATA: Seizure, pulled from car, postictal, confusion.
Tachycardia. Aortic disease, nontraumatic; Abdominal pain, acute,
nonlocalized

EXAM:
CT CHEST, ABDOMEN AND PELVIS WITHOUT CONTRAST
TECHNIQUE: Multidetector CT imaging of the chest, abdomen and pelvis was
performed following the standard protocol without IV contrast.

[Series 2: cap w/o · axial · non-contrast · 0.98mm/px · z∈[-873,-298]mm · 10 of 137 slices shown, 13 images]
[im 11/137  mediastinal]
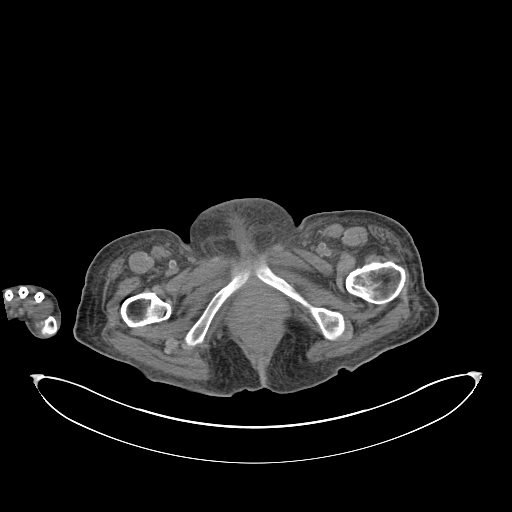
[im 11/137  lung]
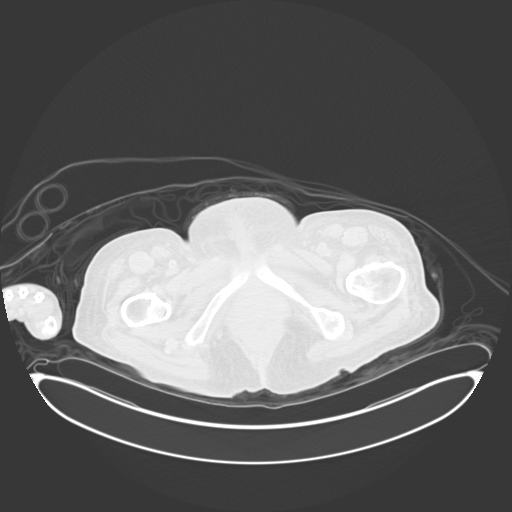
[im 21/137  lung]
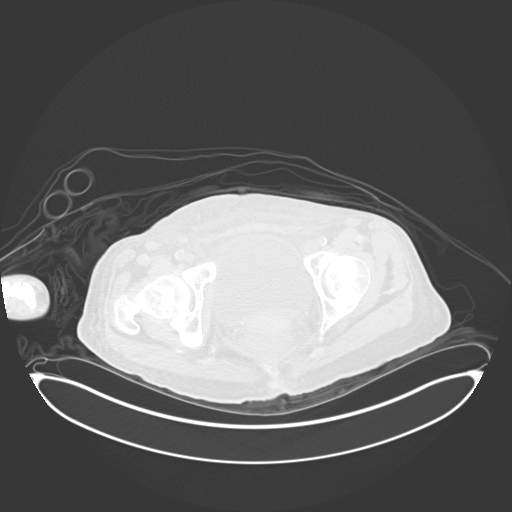
[im 42/137  lung]
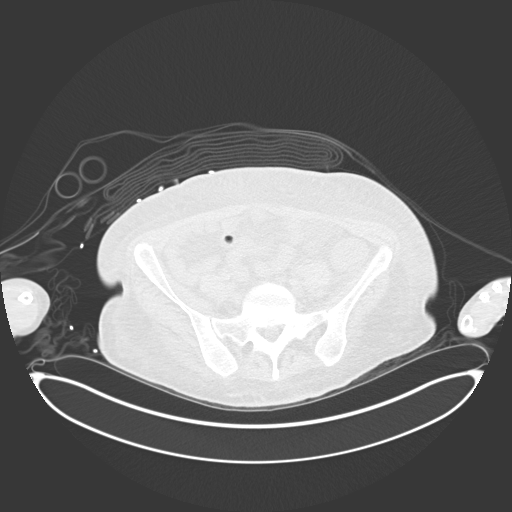
[im 53/137  lung]
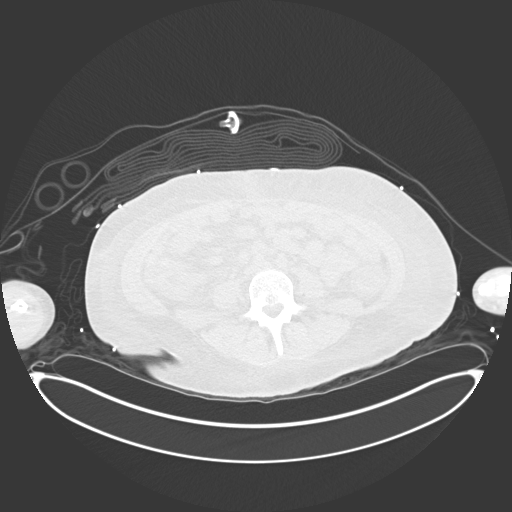
[im 63/137  mediastinal]
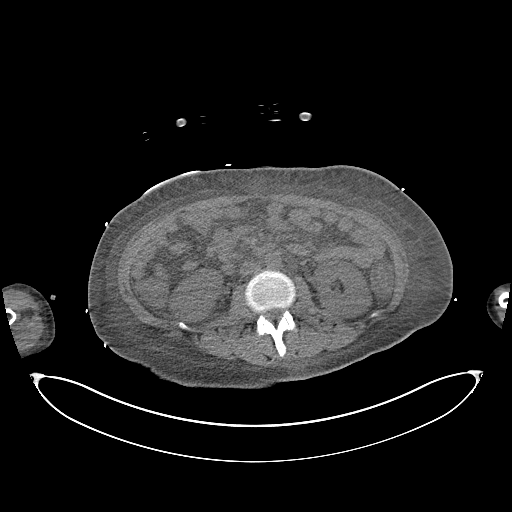
[im 63/137  lung]
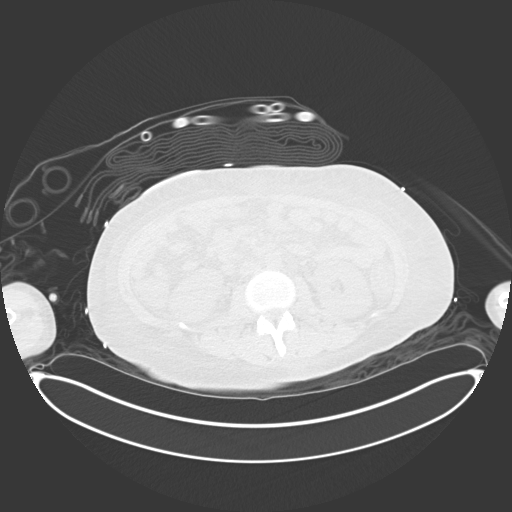
[im 74/137  lung]
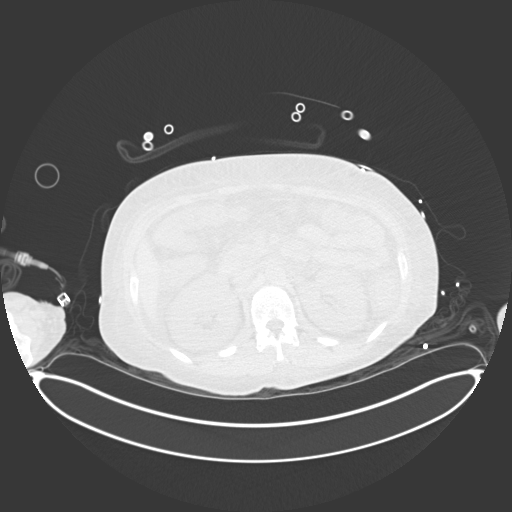
[im 84/137  lung]
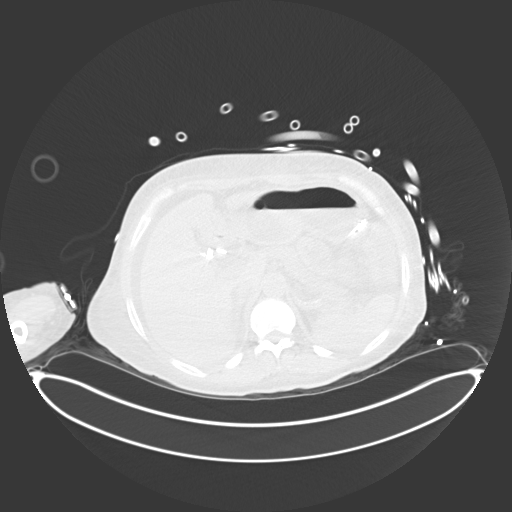
[im 105/137  lung]
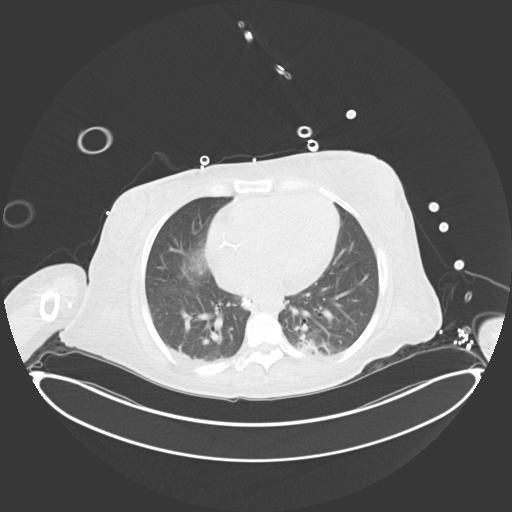
[im 116/137  mediastinal]
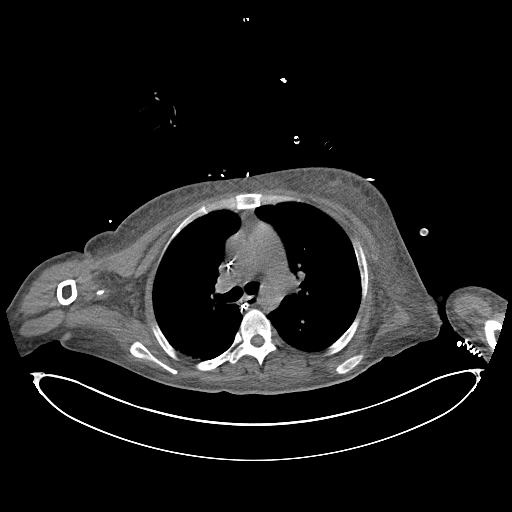
[im 116/137  lung]
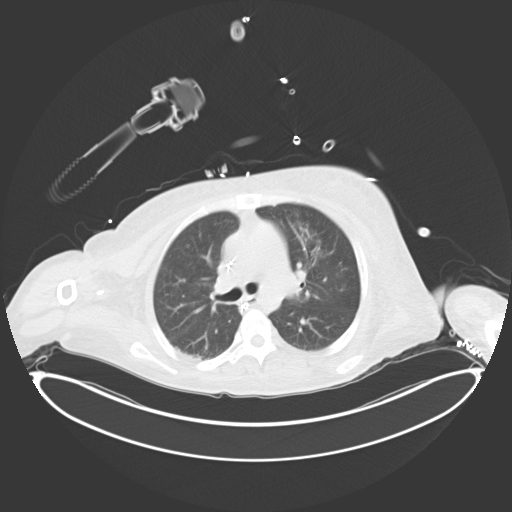
[im 126/137  lung]
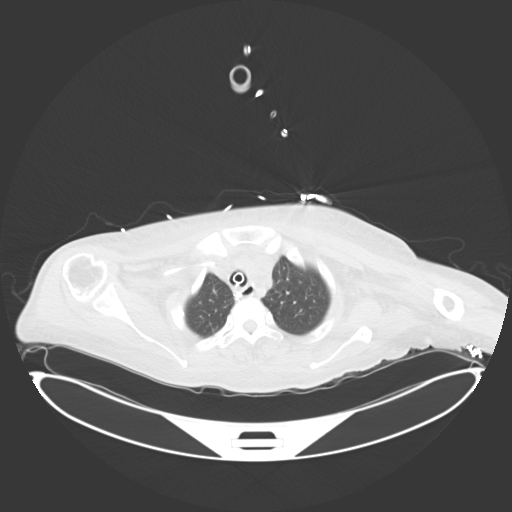

[Series 5: coronals · coronal · 0.84mm/px · 3 of 145 slices shown]
[im 29/145  lung]
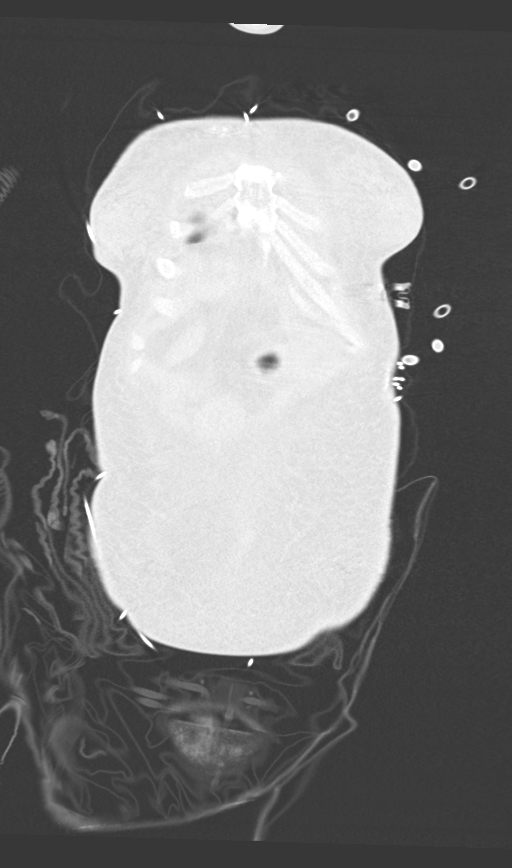
[im 58/145  lung]
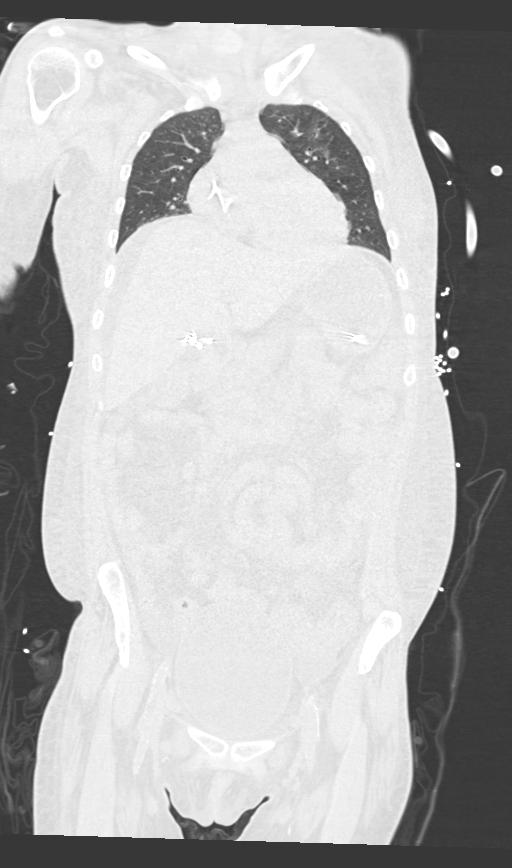
[im 87/145  lung]
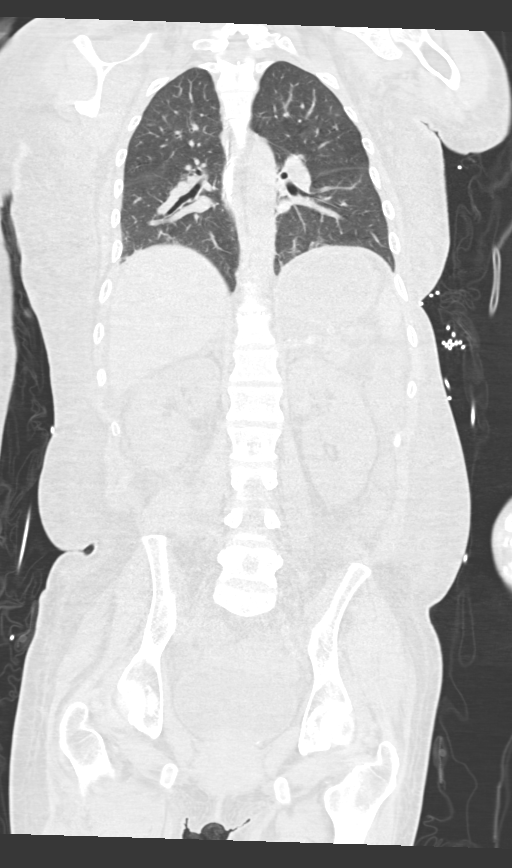

[13 of 36 positions shown; findings below may reference images not displayed]

FINDINGS: CT CHEST FINDINGS

Cardiovascular: Top-normal heart size. No significant pericardial
effusion/thickening. Right PICC terminates at the cavoatrial
junction. Top-normal caliber main pulmonary artery (3.4 cm
diameter).

Mediastinum/Nodes: No discrete thyroid nodules. Unremarkable
esophagus. Enteric tube terminates in the proximal stomach. No
pathologically enlarged axillary, mediastinal or hilar lymph nodes,
noting limited sensitivity for the detection of hilar adenopathy on
this noncontrast study.

Lungs/Pleura: No pneumothorax. No pleural effusion. Endotracheal
tube tip is 2.2 cm above the carina. Mild to moderate patchy
consolidation and ground-glass opacity throughout the dependent
lower lobes bilaterally. Mild patchy ground-glass opacity in the
left upper lobe. No lung masses or significant pulmonary nodules.

Musculoskeletal: No aggressive appearing focal osseous lesions.
Nondisplaced healing subacute anterior left fourth rib fracture. No
acute fractures. Stable sclerotic T6 bone island. Moderate anasarca.

CT ABDOMEN PELVIS FINDINGS

Hepatobiliary: Normal liver with no liver mass. Cholecystectomy. No
biliary ductal dilatation.

Pancreas: Normal, with no mass or duct dilation.

Spleen: Normal size. No mass.

Adrenals/Urinary Tract: Normal adrenals. No renal stones. No
hydronephrosis. No contour deforming renal masses. Normal bladder.

Stomach/Bowel: Normal non-distended stomach. Normal caliber small
bowel with no small bowel wall thickening. Appendix not discretely
visualized. Collapsed large bowel with no diverticulosis, definite
large bowel wall thickening or pericolonic fat stranding.

Vascular/Lymphatic: Normal caliber abdominal aorta. No
pathologically enlarged lymph nodes in the abdomen or pelvis.

Reproductive: Grossly normal uterus.  No adnexal mass.

Other: No pneumoperitoneum. Small volume ascites. No focal fluid
collections. Moderate anasarca.

Musculoskeletal: No aggressive appearing focal osseous lesions.
IMPRESSION: 1. Moderate anasarca. Small volume ascites.
2. Mild to moderate patchy consolidation and ground-glass opacity
throughout the dependent lower lobes bilaterally. Mild patchy
ground-glass opacity in the left upper lobe. Suspect a combination
of aspiration and atelectasis.
3. Well positioned endotracheal and enteric tubes.
4. Nondisplaced healing subacute anterior left fourth rib fracture.
No acute traumatic injury in the chest, abdomen and pelvis on this
noncontrast scan.

## 2020-12-03 MED ORDER — PANTOPRAZOLE SODIUM 40 MG PO PACK
40.0000 mg | PACK | Freq: Every day | ORAL | Status: DC
  Administered 2020-12-03 – 2020-12-05 (×3): 40 mg
  Filled 2020-12-03 (×3): qty 20

## 2020-12-03 MED ORDER — DEXTROSE IN LACTATED RINGERS 5 % IV SOLN: INTRAVENOUS | Status: DC

## 2020-12-03 MED ORDER — PROPOFOL 1000 MG/100ML IV EMUL
5.0000 ug/kg/min | INTRAVENOUS | Status: DC
Start: 2020-12-03 — End: 2020-12-05
  Administered 2020-12-03: 10 ug/kg/min via INTRAVENOUS
  Administered 2020-12-03 – 2020-12-04 (×5): 80 ug/kg/min via INTRAVENOUS
  Administered 2020-12-04: 70 ug/kg/min via INTRAVENOUS
  Administered 2020-12-04: 80 ug/kg/min via INTRAVENOUS
  Administered 2020-12-04: 10 ug/kg/min via INTRAVENOUS
  Administered 2020-12-04: 50 ug/kg/min via INTRAVENOUS
  Administered 2020-12-05: 30 ug/kg/min via INTRAVENOUS
  Filled 2020-12-03 (×5): qty 100
  Filled 2020-12-03: qty 200
  Filled 2020-12-03 (×3): qty 100
  Filled 2020-12-03: qty 200
  Filled 2020-12-03 (×2): qty 100

## 2020-12-03 MED ORDER — POTASSIUM CHLORIDE 10 MEQ/100ML IV SOLN
10.0000 meq | INTRAVENOUS | Status: AC
  Administered 2020-12-03 (×3): 10 meq via INTRAVENOUS
  Filled 2020-12-03 (×3): qty 100

## 2020-12-03 MED ORDER — CHLORHEXIDINE GLUCONATE 0.12% ORAL RINSE (MEDLINE KIT)
15.0000 mL | Freq: Two times a day (BID) | OROMUCOSAL | Status: DC
  Administered 2020-12-03 – 2020-12-05 (×5): 15 mL via OROMUCOSAL

## 2020-12-03 MED ORDER — LEVETIRACETAM IN NACL 500 MG/100ML IV SOLN
500.0000 mg | Freq: Two times a day (BID) | INTRAVENOUS | Status: DC
  Administered 2020-12-03 – 2020-12-06 (×6): 500 mg via INTRAVENOUS
  Filled 2020-12-03 (×7): qty 100

## 2020-12-03 MED ORDER — ONDANSETRON HCL 4 MG/2ML IJ SOLN
4.0000 mg | Freq: Four times a day (QID) | INTRAMUSCULAR | Status: DC | PRN
  Administered 2020-12-06: 4 mg via INTRAVENOUS
  Filled 2020-12-03: qty 2

## 2020-12-03 MED ORDER — POTASSIUM CHLORIDE 2 MEQ/ML IV SOLN
INTRAVENOUS | Status: DC
  Filled 2020-12-03 (×2): qty 1000

## 2020-12-03 MED ORDER — CHLORHEXIDINE GLUCONATE CLOTH 2 % EX PADS
6.0000 | MEDICATED_PAD | Freq: Every day | CUTANEOUS | Status: DC
  Administered 2020-12-03 – 2020-12-10 (×8): 6 via TOPICAL

## 2020-12-03 MED ORDER — LACTATED RINGERS IV SOLN: INTRAVENOUS | Status: DC

## 2020-12-03 MED ORDER — GABAPENTIN 100 MG PO CAPS: 200.0000 mg | ORAL_CAPSULE | Freq: Three times a day (TID) | ORAL | Status: DC

## 2020-12-03 MED ORDER — DOCUSATE SODIUM 50 MG/5ML PO LIQD
100.0000 mg | Freq: Two times a day (BID) | ORAL | Status: DC | PRN
  Filled 2020-12-03: qty 10

## 2020-12-03 MED ORDER — HEPARIN SODIUM (PORCINE) 5000 UNIT/ML IJ SOLN
5000.0000 [IU] | Freq: Three times a day (TID) | INTRAMUSCULAR | Status: DC
  Administered 2020-12-03 – 2020-12-09 (×18): 5000 [IU] via SUBCUTANEOUS
  Filled 2020-12-03 (×19): qty 1

## 2020-12-03 MED ORDER — POTASSIUM CHLORIDE 10 MEQ/100ML IV SOLN
10.0000 meq | INTRAVENOUS | Status: DC
Start: 2020-12-03 — End: 2020-12-03
  Administered 2020-12-03: 10 meq via INTRAVENOUS
  Filled 2020-12-03: qty 100

## 2020-12-03 MED ORDER — LORAZEPAM 2 MG/ML IJ SOLN
INTRAMUSCULAR | Status: AC
  Administered 2020-12-03: 2 mg
  Filled 2020-12-03: qty 1

## 2020-12-03 MED ORDER — CARVEDILOL 12.5 MG PO TABS
12.5000 mg | ORAL_TABLET | Freq: Two times a day (BID) | ORAL | Status: DC
  Administered 2020-12-03 – 2020-12-05 (×4): 12.5 mg
  Filled 2020-12-03 (×4): qty 1

## 2020-12-03 MED ORDER — THIAMINE HCL 100 MG/ML IJ SOLN
500.0000 mg | Freq: Three times a day (TID) | INTRAVENOUS | Status: AC
  Administered 2020-12-03 – 2020-12-06 (×9): 500 mg via INTRAVENOUS
  Filled 2020-12-03 (×9): qty 5

## 2020-12-03 MED ORDER — AMLODIPINE BESYLATE 10 MG PO TABS
10.0000 mg | ORAL_TABLET | Freq: Every day | ORAL | Status: DC
  Administered 2020-12-03 – 2020-12-05 (×3): 10 mg
  Filled 2020-12-03 (×3): qty 1

## 2020-12-03 MED ORDER — ETOMIDATE 2 MG/ML IV SOLN
INTRAVENOUS | Status: AC
  Administered 2020-12-03: 20 mg
  Filled 2020-12-03: qty 10

## 2020-12-03 MED ORDER — LEVETIRACETAM IN NACL 1500 MG/100ML IV SOLN
1500.0000 mg | Freq: Once | INTRAVENOUS | Status: AC
  Administered 2020-12-03: 1500 mg via INTRAVENOUS
  Filled 2020-12-03 (×2): qty 100

## 2020-12-03 MED ORDER — POTASSIUM CHLORIDE 10 MEQ/100ML IV SOLN
10.0000 meq | INTRAVENOUS | Status: AC
  Administered 2020-12-03 (×3): 10 meq via INTRAVENOUS
  Filled 2020-12-03 (×2): qty 100

## 2020-12-03 MED ORDER — METOCLOPRAMIDE HCL 5 MG PO TABS
5.0000 mg | ORAL_TABLET | Freq: Three times a day (TID) | ORAL | Status: DC
  Administered 2020-12-03 – 2020-12-05 (×5): 5 mg
  Filled 2020-12-03 (×8): qty 1

## 2020-12-03 MED ORDER — ORAL CARE MOUTH RINSE
15.0000 mL | OROMUCOSAL | Status: DC
  Administered 2020-12-03 – 2020-12-05 (×16): 15 mL via OROMUCOSAL

## 2020-12-03 MED ORDER — INSULIN DETEMIR 100 UNIT/ML ~~LOC~~ SOLN
0.3000 [IU]/kg | SUBCUTANEOUS | Status: DC
  Administered 2020-12-03: 21 [IU] via SUBCUTANEOUS
  Filled 2020-12-03 (×2): qty 0.21

## 2020-12-03 MED ORDER — POLYETHYLENE GLYCOL 3350 17 G PO PACK: 17.0000 g | PACK | Freq: Every day | ORAL | Status: DC | PRN

## 2020-12-03 MED ORDER — SODIUM CHLORIDE 0.9 % IV BOLUS
1000.0000 mL | Freq: Once | INTRAVENOUS | Status: AC
  Administered 2020-12-03: 1000 mL via INTRAVENOUS

## 2020-12-03 MED ORDER — LORAZEPAM 0.5 MG PO TABS
0.5000 mg | ORAL_TABLET | Freq: Two times a day (BID) | ORAL | Status: DC
  Administered 2020-12-03 – 2020-12-05 (×5): 0.5 mg
  Filled 2020-12-03 (×5): qty 1

## 2020-12-03 MED ORDER — INSULIN ASPART 100 UNIT/ML IJ SOLN: 0.0000 [IU] | INTRAMUSCULAR | Status: DC

## 2020-12-03 MED ORDER — NICARDIPINE HCL IN NACL 20-0.86 MG/200ML-% IV SOLN
3.0000 mg/h | INTRAVENOUS | Status: DC
  Administered 2020-12-03: 5 mg/h via INTRAVENOUS
  Filled 2020-12-03: qty 200

## 2020-12-03 MED ORDER — POTASSIUM CHLORIDE 20 MEQ PO PACK
40.0000 meq | PACK | Freq: Once | ORAL | Status: AC
  Administered 2020-12-03: 40 meq
  Filled 2020-12-03: qty 2

## 2020-12-03 MED ORDER — INSULIN REGULAR(HUMAN) IN NACL 100-0.9 UT/100ML-% IV SOLN
INTRAVENOUS | Status: DC
  Administered 2020-12-03: 7 [IU]/h via INTRAVENOUS
  Filled 2020-12-03: qty 100

## 2020-12-03 MED ORDER — DEXTROSE 50 % IV SOLN
0.0000 mL | INTRAVENOUS | Status: DC | PRN
  Administered 2020-12-04: 25 mL via INTRAVENOUS
  Filled 2020-12-03: qty 50

## 2020-12-03 MED ORDER — IPRATROPIUM-ALBUTEROL 0.5-2.5 (3) MG/3ML IN SOLN: 3.0000 mL | Freq: Four times a day (QID) | RESPIRATORY_TRACT | Status: DC | PRN

## 2020-12-03 MED ORDER — ROCURONIUM BROMIDE 10 MG/ML (PF) SYRINGE
PREFILLED_SYRINGE | INTRAVENOUS | Status: AC
  Administered 2020-12-03: 100 mg
  Filled 2020-12-03: qty 10

## 2020-12-03 MED ORDER — LORAZEPAM 2 MG/ML IJ SOLN
INTRAMUSCULAR | Status: AC
  Filled 2020-12-03: qty 1

## 2020-12-03 MED ORDER — THIAMINE HCL 100 MG/ML IJ SOLN
500.0000 mg | Freq: Three times a day (TID) | INTRAVENOUS | Status: DC
  Filled 2020-12-03 (×3): qty 5

## 2020-12-03 MED ORDER — HYDRALAZINE HCL 50 MG PO TABS
100.0000 mg | ORAL_TABLET | Freq: Two times a day (BID) | ORAL | Status: DC
  Administered 2020-12-03 – 2020-12-04 (×2): 100 mg
  Filled 2020-12-03 (×3): qty 2

## 2020-12-03 MED ORDER — GABAPENTIN 250 MG/5ML PO SOLN
200.0000 mg | Freq: Three times a day (TID) | ORAL | Status: DC
  Administered 2020-12-03 – 2020-12-05 (×6): 200 mg
  Filled 2020-12-03 (×8): qty 4

## 2020-12-03 NOTE — Procedures (Signed)
Patient Name: [name]  MRN: [id number]  Epilepsy Attending: Lora Havens  Referring Physician/Provider: Dr Kerney Elbe Date: 12/03/2020 Duration: 22.05 mins  Patient history: 30yo F with ams. EEG to evaluate for seizure.   Level of alertness: comatose  AEDs during EEG study: GBP, LEV, propofol, Ativan  Technical aspects: This EEG study was done with scalp electrodes positioned according to the 10-20 International system of electrode placement. Electrical activity was acquired at a sampling rate of 500Hz  and reviewed with a high frequency filter of 70Hz  and a low frequency filter of 1Hz . EEG data were recorded continuously and digitally stored.   Description:  EEG showed continuous generalized 2-3Hz  delta slowing admixed with an excessive amount of 15 to 18 Hz beta activity with irregular morphology distributed symmetrically and diffusely. Hyperventilation and photic stimulation were not performed.      ABNORMALITY - Continuous slow, generalized - Excessive beta, generalized   IMPRESSION: This study is suggestive of severe diffuse encephalopathy, nonspecific etiology but likely related to sedation. No seizures or epileptiform discharges were seen throughout the recording.   Dr Cheral Marker was notified.   [name]

## 2020-12-03 NOTE — Progress Notes (Signed)
Inpatient Diabetes Program Recommendations  AACE/ADA: New Consensus Statement on Inpatient Glycemic Control (2015)  Target Ranges:  Prepandial:   less than 140 mg/dL      Peak postprandial:   less than 180 mg/dL (1-2 hours)      Critically ill patients:  140 - 180 mg/dL   Lab Results  Component Value Date   GLUCAP 105 (H) 12/03/2020   HGBA1C 7.2 (H) 11/13/2020    Review of Glycemic Control  Diabetes history: DM1 Outpatient Diabetes medications: Lantus 8 units QD, Humalog 0-5 TID with meal Current orders for Inpatient glycemic control: IV insulin per [employer] for DKA  HgbA1C - 7.2% Hx of hypoglycemia  Inpatient Diabetes Program Recommendations:    When ready for transition to SQ insulin, give Lantus 5 units 2H prior to discontinuation of drip. Novolog 0-6 units Q4H.  Will continue to closely monitor.  Thank you. Lorenda Peck, RD, LDN, CDE Inpatient Diabetes Coordinator [phone]

## 2020-12-03 NOTE — Progress Notes (Signed)
Patient transported to MRI and back to 3O17 without complications. RN at bedside.

## 2020-12-03 NOTE — ED Notes (Signed)
Unable to collect temp at this time

## 2020-12-03 NOTE — Progress Notes (Signed)
EEG complete - results pending 

## 2020-12-03 NOTE — ED Provider Notes (Signed)
[facility] Emergency Department Provider Note MRN:  [id number]  Arrival date & time: 12/03/20     Chief Complaint   Seizures   History of Present Illness   [name] is a 30 y.o. year-old female with a history of hypertension, diabetes presenting to the ED with chief complaint of seizure.  Patient complaining of back pain 2 days ago, was acting abnormally today, multiple seizures at home and upon arrival here in the emergency department.  Altered, combative.  I was unable to obtain an accurate HPI, PMH, or ROS due to the patient's altered mental status.  Level 5 caveat.  Review of Systems  Positive for back pain, seizures, altered mental status.  Patient's Health History    Past Medical History:  Diagnosis Date   Allergy    Anemia    Anxiety    Blood transfusion without reported diagnosis    Dec 2018   Cataract    right eye   COVID-19 03/2020   Depression    Diabetes type 1, uncontrolled ([place]) 11/14/2011   Since age 19   Fibromyalgia    Gastroparesis    GERD (gastroesophageal reflux disease)    Hypertension    Infection    UTI April 2016    Past Surgical History:  Procedure Laterality Date   AMPUTATION Right 04/13/2020   Procedure: 1ST RAY AMPUTATION RIGHT FOOT;  Surgeon: Newt Minion, MD;  Location: [place];  Service: Orthopedics;  Laterality: Right;   AMPUTATION Right 06/06/2020   Procedure: RIGHT BELOW KNEE AMPUTATION;  Surgeon: Newt Minion, MD;  Location: [place];  Service: Orthopedics;  Laterality: Right;   AMPUTATION Left    great toe   ANKLE SURGERY     APPLICATION OF WOUND VAC Right 06/06/2020   Procedure: APPLICATION OF WOUND VAC;  Surgeon: Newt Minion, MD;  Location: [place];  Service: Orthopedics;  Laterality: Right;   CHOLECYSTECTOMY  11/15/2011   Procedure: LAPAROSCOPIC CHOLECYSTECTOMY WITH INTRAOPERATIVE CHOLANGIOGRAM;  Surgeon: [name], MD;  Location: [facility];  Service: General;  Laterality: N/A;    COLONOSCOPY     COLONOSCOPY WITH PROPOFOL N/A 06/27/2017   Procedure: COLONOSCOPY WITH PROPOFOL;  Surgeon: Milus Banister, MD;  Location: [facility];  Service: Endoscopy;  Laterality: N/A;   ESOPHAGOGASTRODUODENOSCOPY  12/03/2011   Procedure: ESOPHAGOGASTRODUODENOSCOPY (EGD);  Surgeon: Beryle Beams, MD;  Location: [facility];  Service: Endoscopy;  Laterality: N/A;   FLEXIBLE SIGMOIDOSCOPY N/A 03/10/2017   Procedure: FLEXIBLE SIGMOIDOSCOPY;  Surgeon: Carol Ada, MD;  Location: [facility];  Service: Endoscopy;  Laterality: N/A;   INCISION AND DRAINAGE PERIRECTAL ABSCESS N/A 03/01/2017   Procedure: IRRIGATION AND DEBRIDEMENT PERIRECTAL ABSCESS;  Surgeon: Alphonsa Overall, MD;  Location: [facility];  Service: General;  Laterality: N/A;   IRRIGATION AND DEBRIDEMENT BUTTOCKS N/A 03/23/2017   Procedure: IRRIGATION AND DEBRIDEMENT BUTTOCKS, SETON PLACEMENT;  Surgeon: [name], MD;  Location: [facility];  Service: General;  Laterality: N/A;   LAPAROSCOPY  11/23/2011   Procedure: LAPAROSCOPY DIAGNOSTIC;  Surgeon: Edward Jolly, MD;  Location: [facility];  Service: General;  Laterality: N/A;   SIGMOIDOSCOPY     UPPER GASTROINTESTINAL ENDOSCOPY     WISDOM TOOTH EXTRACTION      Family History  Problem Relation Age of Onset   Diabetes Mother    Hypertension Father    Colon cancer Paternal Grandmother        pt thinks PGM was dx in her 20's   Diabetes Paternal  Grandmother    Diabetes Maternal Grandmother    Diabetes Maternal Grandfather    Diabetes Paternal Grandfather    Diabetes Other    Breast cancer Paternal Aunt    Esophageal cancer Neg Hx    Liver cancer Neg Hx    Pancreatic cancer Neg Hx    Stomach cancer Neg Hx    Rectal cancer Neg Hx     Social History   Socioeconomic History   Marital status: Married    Spouse name: sergio   Number of children: 0   Years of education: college   Highest education level: Not on file  Occupational History   Occupation: Publishing rights manager  call center    Employer: [employer]  Tobacco Use   Smoking status: Never   Smokeless tobacco: Never  Vaping Use   Vaping Use: Never used  Substance and Sexual Activity   Alcohol use: No    Alcohol/week: 0.0 standard drinks   Drug use: Not Currently    Types: Marijuana   Sexual activity: Not Currently    Partners: Male    Birth control/protection: None  Other Topics Concern   Not on file  Social History Narrative   Not on file   Social Determinants of Health   Financial Resource Strain: Not on file  Food Insecurity: Food Insecurity Present   Worried About [place] in the Last Year: Sometimes true   Ran Out of Food in the Last Year: Sometimes true  Transportation Needs: No Transportation Needs   Lack of Transportation (Medical): No   Lack of Transportation (Non-Medical): No  Physical Activity: Not on file  Stress: Not on file  Social Connections: Not on file  Intimate Partner Violence: Not on file     Physical Exam   Vitals:   12/03/20 0625 12/03/20 0630  BP: (!) 206/133 (!) 206/132  Pulse: (!) 111 (!) 111  Resp: 12 12  Temp: 98.3 F (36.8 C) 98.2 F (36.8 C)  SpO2: 100% 100%    CONSTITUTIONAL: Ill-appearing, severely agitated NEURO: Awake, groaning, does not follow commands, combative EYES:  eyes equal and reactive ENT/NECK:  no LAD, no JVD CARDIO: Tachycardic rate, well-perfused, normal S1 and S2 PULM:  CTAB no wheezing or rhonchi GI/GU:  normal bowel sounds, non-distended, non-tender MSK/SPINE: Right BKA SKIN:  no rash, atraumatic PSYCH:  Appropriate speech and behavior  *Additional and/or pertinent findings included in MDM below  Diagnostic and Interventional Summary    EKG Interpretation  Date/Time:  Monday December 03 2020 06:11:37 EDT Ventricular Rate:  114 PR Interval:  123 QRS Duration: 80 QT Interval:  344 QTC Calculation: 474 R Axis:   70 Text Interpretation: Sinus tachycardia Confirmed by Gerlene Fee [phone]) on  12/03/2020 6:41:07 AM       Labs Reviewed  CBC WITH DIFFERENTIAL/PLATELET - Abnormal; Notable for the following components:      Result Value   RBC 3.74 (*)    Hemoglobin 10.9 (*)    HCT 34.4 (*)    All other components within normal limits  COMPREHENSIVE METABOLIC PANEL - Abnormal; Notable for the following components:   Potassium 3.2 (*)    CO2 18 (*)    Glucose, Bld 314 (*)    BUN 23 (*)    Creatinine, Ser 1.45 (*)    Calcium 8.8 (*)    Albumin 2.5 (*)    Alkaline Phosphatase 155 (*)    GFR, Estimated 49 (*)    Anion gap 18 (*)  All other components within normal limits  ACETAMINOPHEN LEVEL - Abnormal; Notable for the following components:   Acetaminophen (Tylenol), Serum <10 (*)    All other components within normal limits  SALICYLATE LEVEL - Abnormal; Notable for the following components:   Salicylate Lvl <9.6 (*)    All other components within normal limits  BLOOD GAS, ARTERIAL - Abnormal; Notable for the following components:   pCO2 arterial 29.6 (*)    pO2, Arterial 497 (*)    Bicarbonate 19.8 (*)    Acid-base deficit 2.8 (*)    All other components within normal limits  CBG MONITORING, ED - Abnormal; Notable for the following components:   Glucose-Capillary 255 (*)    All other components within normal limits  ETHANOL  CK  LACTIC ACID, PLASMA  RAPID URINE DRUG SCREEN, HOSP PERFORMED  PROTIME-INR  I-STAT BETA HCG BLOOD, ED ([facility], [facility], [facility] ONLY)  TROPONIN I (HIGH SENSITIVITY)  TROPONIN I (HIGH SENSITIVITY)    CT HEAD WO CONTRAST (5MM)  Final Result    CT ABDOMEN PELVIS WO CONTRAST  Final Result    CT CHEST WO CONTRAST  Final Result    DG Chest Port 1 View  Final Result      Medications  propofol (DIPRIVAN) 1000 MG/100ML infusion (60 mcg/kg/min  72.6 kg Intravenous Rate/Dose Change 12/03/20 0604)  nicardipine (CARDENE) 27m in 0.86% saline 2028mIV infusion (0.1 mg/ml) (has no administration in time range)  levETIRAcetam (KEPPRA) IVPB 1500 mg/ 100 mL  premix (has no administration in time range)  LORazepam (ATIVAN) 2 MG/ML injection (2 mg  Given 12/03/20 0504)  LORazepam (ATIVAN) 2 MG/ML injection (2 mg  Given 12/03/20 0510)  LORazepam (ATIVAN) 2 MG/ML injection (  Given 12/03/20 0500)  etomidate (AMIDATE) 2 MG/ML injection (20 mg  Given 12/03/20 0528)  rocuronium bromide 100 MG/10ML SOSY (100 mg  Given 12/03/20 0529)  sodium chloride 0.9 % bolus 1,000 mL (1,000 mLs Intravenous New Bag/Given 12/03/20 0612)     Procedures  /  Critical Care .Critical Care  Date/Time: 12/03/2020 5:56 AM Performed by: BeMaudie FlakesMD Authorized by: BeMaudie FlakesMD   Critical care provider statement:    Critical care time (minutes):  45   Critical care was necessary to treat or prevent imminent or life-threatening deterioration of the following conditions:  CNS failure or compromise   Critical care was time spent personally by me on the following activities:  Discussions with consultants, evaluation of patient's response to treatment, examination of patient, ordering and performing treatments and interventions, ordering and review of laboratory studies, ordering and review of radiographic studies, pulse oximetry, re-evaluation of patient's condition, obtaining history from patient or surrogate and review of old charts Procedure Name: Intubation Date/Time: 12/03/2020 5:56 AM Performed by: BeMaudie FlakesMD Pre-anesthesia Checklist: Patient identified, Patient being monitored, Emergency Drugs available, Timeout performed and Suction available Oxygen Delivery Method: Non-rebreather mask Preoxygenation: Pre-oxygenation with 100% oxygen Induction Type: Rapid sequence Ventilation: Mask ventilation without difficulty Laryngoscope Size: Mac and 4 Grade View: Grade I Tube size: 7.5 mm Number of attempts: 1 Airway Equipment and Method: Stylet Placement Confirmation: ETT inserted through vocal cords under direct vision, CO2 detector and Breath sounds checked- equal and  bilateral Secured at: 23 cm Tube secured with: ETT holder     ED Course and Medical Decision Making  I have reviewed the triage vital signs, the nursing notes, and pertinent available records from the EMR.  Listed above are laboratory and imaging  tests that I personally ordered, reviewed, and interpreted and then considered in my medical decision making (see below for details).  Agitated delirium after witnessed seizure, no significant seizure history.  Patient is clearly altered, combative, concern for intracranial bleeding or elevated ICP.  Attempted to calm the patient with multiple rounds of Ativan, still combative and not able to be still after 6 mg.  Decision made to intubate as described above for airway protection to facilitate imaging and further management.  Awaiting CT imaging.     CT imaging is overall unremarkable.  As it is laboratory assessment.  Patient remains significantly hypertensive despite propofol and being relatively calm with the sedation.  Lingering concern for hypertensive emergency or press.  Starting on nicardipine, will also provide Keppra.  Discussed this with Dr. Lorrin Goodell neurology, plan is to admit to ICU at [facility] so that we have the option of continuous EEG monitoring.  Barth Kirks. [place]onia Small, [place] [email or web address]  Final Clinical Impressions(s) / ED Diagnoses     ICD-10-CM   1. Seizure ([place])  R56.9     2. Delirium  R41.0     3. Agitation  R45.1     4. Hypertensive emergency  I16.1       ED Discharge Orders     None        Discharge Instructions Discussed with and Provided to Patient:   Discharge Instructions   None       Maudie Flakes, MD 12/03/20 [phone]

## 2020-12-03 NOTE — ED Notes (Signed)
[facility] called for transport. 

## 2020-12-03 NOTE — ED Notes (Signed)
Pt transported to CT ?

## 2020-12-03 NOTE — Progress Notes (Signed)
Called regarding [facility] EEG. Pt will be going to [facility] MRI first.Will attempt EEG when pt returns from MRI

## 2020-12-03 NOTE — Consult Note (Signed)
NEURO HOSPITALIST CONSULT NOTE   Requestig physician: Dr. Silas Flood  Reason for Consult: New onset GTC seizure  History obtained from:  Husband and Chart     HPI:                                                                                                                                          [name] is an 30 y.o. female with PMHx including uncontrolled DM1 and other diagnoses as outlined below who presented to the [facility] ED early this AM with seizure activity. She had been complaining of back pain 2 days ago, was acting abnormally today, had multiple seizures at home prompting husband's decision to drive her to the ED. She was actively seizing when she was pulled out of the car her husband used to drive her to the hospital. Seizure activity then stopped, but she was noted to be postictal and combative. Per EDP, she appeared to be agitated and delirious after witnessed seizure. ED staff attempted to calm the patient with multiple rounds of Ativan, but she was still combative and not able to be still after 6 mg.  She was intubated and sedated with propofol. BP was 200s/130s continuously despite propofol titration, but later stabilized with medical management.    CT head was unremarkable. She was loaded with Keppra.   She has no prior history of seizure.   Past Medical History:  Diagnosis Date   Allergy    Anemia    Anxiety    Blood transfusion without reported diagnosis    Dec 2018   Cataract    right eye   COVID-19 03/2020   Depression    Diabetes type 1, uncontrolled ([place]) 11/14/2011   Since age 30   Fibromyalgia    Gastroparesis    GERD (gastroesophageal reflux disease)    Hypertension    Infection    UTI April 2016    Past Surgical History:  Procedure Laterality Date   AMPUTATION Right 04/13/2020   Procedure: 1ST RAY AMPUTATION RIGHT FOOT;  Surgeon: Newt Minion, MD;  Location: [place];  Service: Orthopedics;  Laterality: Right;   AMPUTATION  Right 06/06/2020   Procedure: RIGHT BELOW KNEE AMPUTATION;  Surgeon: Newt Minion, MD;  Location: [place];  Service: Orthopedics;  Laterality: Right;   AMPUTATION Left    great toe   ANKLE SURGERY     APPLICATION OF WOUND VAC Right 06/06/2020   Procedure: APPLICATION OF WOUND VAC;  Surgeon: Newt Minion, MD;  Location: [place];  Service: Orthopedics;  Laterality: Right;   CHOLECYSTECTOMY  11/15/2011   Procedure: LAPAROSCOPIC CHOLECYSTECTOMY WITH INTRAOPERATIVE CHOLANGIOGRAM;  Surgeon: [name], MD;  Location: [facility];  Service: General;  Laterality: N/A;   COLONOSCOPY  COLONOSCOPY WITH PROPOFOL N/A 06/27/2017   Procedure: COLONOSCOPY WITH PROPOFOL;  Surgeon: Milus Banister, MD;  Location: [facility];  Service: Endoscopy;  Laterality: N/A;   ESOPHAGOGASTRODUODENOSCOPY  12/03/2011   Procedure: ESOPHAGOGASTRODUODENOSCOPY (EGD);  Surgeon: Beryle Beams, MD;  Location: [facility];  Service: Endoscopy;  Laterality: N/A;   FLEXIBLE SIGMOIDOSCOPY N/A 03/10/2017   Procedure: FLEXIBLE SIGMOIDOSCOPY;  Surgeon: Carol Ada, MD;  Location: [facility];  Service: Endoscopy;  Laterality: N/A;   INCISION AND DRAINAGE PERIRECTAL ABSCESS N/A 03/01/2017   Procedure: IRRIGATION AND DEBRIDEMENT PERIRECTAL ABSCESS;  Surgeon: Alphonsa Overall, MD;  Location: [facility];  Service: General;  Laterality: N/A;   IRRIGATION AND DEBRIDEMENT BUTTOCKS N/A 03/23/2017   Procedure: IRRIGATION AND DEBRIDEMENT BUTTOCKS, SETON PLACEMENT;  Surgeon: [name], MD;  Location: [facility];  Service: General;  Laterality: N/A;   LAPAROSCOPY  11/23/2011   Procedure: LAPAROSCOPY DIAGNOSTIC;  Surgeon: Edward Jolly, MD;  Location: [facility];  Service: General;  Laterality: N/A;   SIGMOIDOSCOPY     UPPER GASTROINTESTINAL ENDOSCOPY     WISDOM TOOTH EXTRACTION      Family History  Problem Relation Age of Onset   Diabetes Mother    Hypertension Father    Colon cancer Paternal Grandmother        pt thinks PGM was dx in  her 45's   Diabetes Paternal Grandmother    Diabetes Maternal Grandmother    Diabetes Maternal Grandfather    Diabetes Paternal Grandfather    Diabetes Other    Breast cancer Paternal Aunt    Esophageal cancer Neg Hx    Liver cancer Neg Hx    Pancreatic cancer Neg Hx    Stomach cancer Neg Hx    Rectal cancer Neg Hx              Social History:  reports that she has never smoked. She has never used smokeless tobacco. She reports that she does not currently use drugs after having used the following drugs: Marijuana. She reports that she does not drink alcohol.  Allergies  Allergen Reactions   Other Anaphylaxis    Reaction to [place] nuts    Lactose Intolerance (Gi) Diarrhea    HOME MEDICATIONS:                                                                                                                      No current facility-administered medications on file prior to encounter.   Current Outpatient Medications on File Prior to Encounter  Medication Sig Dispense Refill   Albuterol Sulfate (PROAIR RESPICLICK) 793 (90 Base) MCG/ACT AEPB Inhale 2 puffs into the lungs daily as needed. (Patient taking differently: Inhale 2 puffs into the lungs daily as needed (wheezing).) 1 each 2   amLODipine (NORVASC) 10 MG tablet Take 1 tablet (10 mg total) by mouth daily. 30 tablet 0   busPIRone (BUSPAR) 5 MG tablet Take 1 tablet (5 mg total) by mouth 3 (three) times daily. 90 tablet 5  Carboxymethylcell-Hypromellose (GENTEAL) 0.25-0.3 % GEL Place 1 drop into both eyes daily as needed (dryeyes).     carvedilol (COREG) 12.5 MG tablet Take 1 tablet (12.5 mg total) by mouth 2 (two) times daily with a meal. 60 tablet 2   cyclobenzaprine (FLEXERIL) 10 MG tablet Take 1 tablet (10 mg total) by mouth 3 (three) times daily as needed for muscle spasms. 30 tablet 5   dicyclomine (BENTYL) 20 MG tablet Take 1 tablet (20 mg total) by mouth every 8 (eight) hours as needed for spasms. 15 tablet 0   EPINEPHrine  0.3 mg/0.3 mL IJ SOAJ injection Inject 0.3 mg into the muscle once as needed (anaphylaxis/allergic reaction). (Patient taking differently: Inject 0.3 mg into the muscle once as needed for anaphylaxis.) 2 each 0   gabapentin (NEURONTIN) 100 MG capsule Take 200 mg by mouth 3 (three) times daily.     hydrALAZINE (APRESOLINE) 50 MG tablet Take 100 mg by mouth 2 (two) times daily.     insulin glargine (LANTUS) 100 UNIT/ML injection Inject 0.08 mLs (8 Units total) into the skin daily. 10 mL 11   insulin lispro (HUMALOG) 100 UNIT/ML injection Inject 0-5 Units into the skin 3 (three) times daily before meals. Sliding scale     loperamide (IMODIUM) 2 MG capsule Take 6 mg by mouth daily as needed for diarrhea or loose stools.      LORazepam (ATIVAN) 0.5 MG tablet Take 1 tablet (0.5 mg total) by mouth 2 (two) times daily. 60 tablet 5   metoCLOPramide (REGLAN) 5 MG tablet Take 1 tablet (5 mg total) by mouth 3 (three) times daily before meals. Please make and keep an appointment for further refills. 90 tablet 2   ondansetron (ZOFRAN ODT) 4 MG disintegrating tablet Take 1 tablet (4 mg total) by mouth every 8 (eight) hours as needed for nausea or vomiting. 20 tablet 0   oxyCODONE-acetaminophen (PERCOCET) 10-325 MG tablet Take 1 tablet by mouth every 4 (four) hours as needed for pain. 30 tablet 0   traZODone (DESYREL) 50 MG tablet Take 0.5-1 tablets (25-50 mg total) by mouth at bedtime as needed for sleep. 30 tablet 3   azithromycin (ZITHROMAX) 250 MG tablet Take 1 tablet (250 mg total) by mouth daily. Take first 2 tablets together, then 1 every day until finished. (Patient not taking: No sig reported) 6 tablet 0   Continuous Blood Gluc Sensor (DEXCOM G6 SENSOR) MISC USE AS DIRECTED EVERY 10 DAYS 3 each 1   Continuous Blood Gluc Transmit (DEXCOM G6 TRANSMITTER) MISC USE AS DIRECTED TO  MONITOR  GLUCOSE  LEVELS 1 each 0   furosemide (LASIX) 40 MG tablet TAKE 1 TABLET (40 MG TOTAL) BY MOUTH DAILY. (Patient not taking:  Reported on 12/03/2020) 90 tablet 1   Insulin Pen Needle 32G X 4 MM MISC USE AS DIRECTED WITH INSULIN PENS 100 each 0   mirtazapine (REMERON) 30 MG tablet TAKE 1 TABLET BY MOUTH AT  BEDTIME (Patient not taking: Reported on 12/03/2020) 30 tablet 11   pantoprazole (PROTONIX) 40 MG tablet Take 1 tablet (40 mg total) by mouth 2 (two) times daily. (Patient not taking: No sig reported) 180 tablet 2   [DISCONTINUED] potassium chloride SA (KLOR-CON) 20 MEQ tablet Take 1 tablet (20 mEq total) by mouth daily for 5 doses. 5 tablet 0     ROS:  Unable to obtain due to obtundation.    Blood pressure (!) 179/104, pulse (P) 89, temperature (!) 96.8 F (36 C), resp. rate 16, height 5' 7"  (1.702 m), SpO2 100 %.   General Examination:                                                                                                       Physical Exam  HEENT-  [place]/AT    Lungs- Intubated Extremities- Right BKA, left toe amputation   Neurological Examination Mental Status: Initially on propofol. Exam performed 8 minutes after propofol held. Obtunded. No response to voice. No attempts to communicate. No eye opening spontaneously or to stimulation. No purposeful movements but will weakly thrash all 4 extremities to noxious stimulation.  Cranial Nerves: II: PERRL. No blink to threat bilaterally.  III,IV, VI: Eyes are conjugate and deviated approximately 10 mm to the left, but without jerking movements or nystagmus. Eyes do not move in response to oculocephalic maneuver.   V: Repetitive chewing movements of jaw, but without jerking  VII: Corneal reflexes intact. Facial muscles of expression without twitching and are flaccid in the context of intubation and residual sedation.  VIII: No response to auditory stimuli IX,X: Intubated XI: Head rotated slightly to the left, without jerking  movements.  XII: Tongue is protruded  with intermittent non-rhythmic further protrusions and retractions in concert with chewing movements of jaw.  Motor/Sensory: Thrashes all 4 extremities in response to noxious stimuli. No gross asymmetry noted.  RLE sensory/motor exam limited in the context of BKA.  Deep Tendon Reflexes: Hypoactive BUE and LLE. Hypoactive right patellar reflex Plantars: Right: BKA   Left: Toe amputation Cerebellar/Gait: Unable to assess   Lab Results: Basic Metabolic Panel: Recent Labs  Lab 11/29/20 1245 12/03/20 0516 12/03/20 0847  NA 142 142 140  K 4.1 3.2* 2.8*  CL 110 106 109  CO2 18* 18* 18*  GLUCOSE 210* 314* 323*  BUN 36* 23* 25*  CREATININE 2.00* 1.45* 1.56*  CALCIUM 9.0 8.8* 8.0*    CBC: Recent Labs  Lab 11/29/20 1245 12/03/20 0516 12/03/20 0847  WBC 10.1 10.1 7.4  NEUTROABS 8.2* 7.4  --   HGB 9.4* 10.9* 9.2*  HCT 30.4* 34.4* 28.2*  MCV 94.7 92.0 90.1  PLT 286 309 196    Cardiac Enzymes: Recent Labs  Lab 12/03/20 0516  CKTOTAL 93    Lipid Panel: No results for input(s): CHOL, TRIG, HDL, CHOLHDL, VLDL, LDLCALC in the last 168 hours.  Imaging: CT ABDOMEN PELVIS WO CONTRAST  Result Date: 12/03/2020 CLINICAL DATA:  Seizure, pulled from car, postictal, confusion. Tachycardia. Aortic disease, nontraumatic; Abdominal pain, acute, nonlocalized EXAM: CT CHEST, ABDOMEN AND PELVIS WITHOUT CONTRAST TECHNIQUE: Multidetector CT imaging of the chest, abdomen and pelvis was performed following the standard protocol without IV contrast. COMPARISON:  11/12/2020 unenhanced CT abdomen/pelvis. 06/04/2020 chest CT angiogram. FINDINGS: CT CHEST FINDINGS Cardiovascular: Top-normal heart size. No significant pericardial effusion/thickening. Right PICC terminates at the cavoatrial junction. Top-normal caliber main pulmonary artery (3.4 cm diameter). Mediastinum/Nodes: No discrete thyroid nodules. Unremarkable esophagus. Enteric tube  terminates in the proximal  stomach. No pathologically enlarged axillary, mediastinal or hilar lymph nodes, noting limited sensitivity for the detection of hilar adenopathy on this noncontrast study. Lungs/Pleura: No pneumothorax. No pleural effusion. Endotracheal tube tip is 2.2 cm above the carina. Mild to moderate patchy consolidation and ground-glass opacity throughout the dependent lower lobes bilaterally. Mild patchy ground-glass opacity in the left upper lobe. No lung masses or significant pulmonary nodules. Musculoskeletal: No aggressive appearing focal osseous lesions. Nondisplaced healing subacute anterior left fourth rib fracture. No acute fractures. Stable sclerotic T6 bone island. Moderate anasarca. CT ABDOMEN PELVIS FINDINGS Hepatobiliary: Normal liver with no liver mass. Cholecystectomy. No biliary ductal dilatation. Pancreas: Normal, with no mass or duct dilation. Spleen: Normal size. No mass. Adrenals/Urinary Tract: Normal adrenals. No renal stones. No hydronephrosis. No contour deforming renal masses. Normal bladder. Stomach/Bowel: Normal non-distended stomach. Normal caliber small bowel with no small bowel wall thickening. Appendix not discretely visualized. Collapsed large bowel with no diverticulosis, definite large bowel wall thickening or pericolonic fat stranding. Vascular/Lymphatic: Normal caliber abdominal aorta. No pathologically enlarged lymph nodes in the abdomen or pelvis. Reproductive: Grossly normal uterus.  No adnexal mass. Other: No pneumoperitoneum. Small volume ascites. No focal fluid collections. Moderate anasarca. Musculoskeletal: No aggressive appearing focal osseous lesions. IMPRESSION: 1. Moderate anasarca. Small volume ascites. 2. Mild to moderate patchy consolidation and ground-glass opacity throughout the dependent lower lobes bilaterally. Mild patchy ground-glass opacity in the left upper lobe. Suspect a combination of aspiration and atelectasis. 3. Well positioned endotracheal and enteric tubes. 4.  Nondisplaced healing subacute anterior left fourth rib fracture. No acute traumatic injury in the chest, abdomen and pelvis on this noncontrast scan. Electronically Signed   By: Ilona Sorrel M.D.   On: 12/03/2020 06:28   CT HEAD WO CONTRAST (5MM)  Result Date: 12/03/2020 CLINICAL DATA:  Seizure EXAM: CT HEAD WITHOUT CONTRAST TECHNIQUE: Contiguous axial images were obtained from the base of the skull through the vertex without intravenous contrast. COMPARISON:  09/25/2020 FINDINGS: Brain: No evidence of acute infarction, hemorrhage, hydrocephalus, extra-axial collection or mass lesion/mass effect. Vascular: No hyperdense vessel or unexpected calcification. Skull: Normal. Negative for fracture or focal lesion. Sinuses/Orbits: No acute finding. IMPRESSION: Stable and negative head CT. Electronically Signed   By: Monte Fantasia M.D.   On: 12/03/2020 06:17   CT CHEST WO CONTRAST  Result Date: 12/03/2020 CLINICAL DATA:  Seizure, pulled from car, postictal, confusion. Tachycardia. Aortic disease, nontraumatic; Abdominal pain, acute, nonlocalized EXAM: CT CHEST, ABDOMEN AND PELVIS WITHOUT CONTRAST TECHNIQUE: Multidetector CT imaging of the chest, abdomen and pelvis was performed following the standard protocol without IV contrast. COMPARISON:  11/12/2020 unenhanced CT abdomen/pelvis. 06/04/2020 chest CT angiogram. FINDINGS: CT CHEST FINDINGS Cardiovascular: Top-normal heart size. No significant pericardial effusion/thickening. Right PICC terminates at the cavoatrial junction. Top-normal caliber main pulmonary artery (3.4 cm diameter). Mediastinum/Nodes: No discrete thyroid nodules. Unremarkable esophagus. Enteric tube terminates in the proximal stomach. No pathologically enlarged axillary, mediastinal or hilar lymph nodes, noting limited sensitivity for the detection of hilar adenopathy on this noncontrast study. Lungs/Pleura: No pneumothorax. No pleural effusion. Endotracheal tube tip is 2.2 cm above the carina.  Mild to moderate patchy consolidation and ground-glass opacity throughout the dependent lower lobes bilaterally. Mild patchy ground-glass opacity in the left upper lobe. No lung masses or significant pulmonary nodules. Musculoskeletal: No aggressive appearing focal osseous lesions. Nondisplaced healing subacute anterior left fourth rib fracture. No acute fractures. Stable sclerotic T6 bone island. Moderate anasarca. CT ABDOMEN PELVIS FINDINGS Hepatobiliary: Normal  liver with no liver mass. Cholecystectomy. No biliary ductal dilatation. Pancreas: Normal, with no mass or duct dilation. Spleen: Normal size. No mass. Adrenals/Urinary Tract: Normal adrenals. No renal stones. No hydronephrosis. No contour deforming renal masses. Normal bladder. Stomach/Bowel: Normal non-distended stomach. Normal caliber small bowel with no small bowel wall thickening. Appendix not discretely visualized. Collapsed large bowel with no diverticulosis, definite large bowel wall thickening or pericolonic fat stranding. Vascular/Lymphatic: Normal caliber abdominal aorta. No pathologically enlarged lymph nodes in the abdomen or pelvis. Reproductive: Grossly normal uterus.  No adnexal mass. Other: No pneumoperitoneum. Small volume ascites. No focal fluid collections. Moderate anasarca. Musculoskeletal: No aggressive appearing focal osseous lesions. IMPRESSION: 1. Moderate anasarca. Small volume ascites. 2. Mild to moderate patchy consolidation and ground-glass opacity throughout the dependent lower lobes bilaterally. Mild patchy ground-glass opacity in the left upper lobe. Suspect a combination of aspiration and atelectasis. 3. Well positioned endotracheal and enteric tubes. 4. Nondisplaced healing subacute anterior left fourth rib fracture. No acute traumatic injury in the chest, abdomen and pelvis on this noncontrast scan. Electronically Signed   By: Ilona Sorrel M.D.   On: 12/03/2020 06:28   DG Chest Port 1 View  Result Date:  12/03/2020 CLINICAL DATA:  Intubated, seizure EXAM: PORTABLE CHEST 1 VIEW COMPARISON:  11/19/2020 chest radiograph. FINDINGS: Endotracheal tube tip is 4.3 cm above the carina. Enteric tube enters stomach with the tip not seen on this image. Right PICC terminates at the cavoatrial junction. Stable cardiomediastinal silhouette with top-normal heart size. No pneumothorax. No pleural effusion. Mild hazy patchy bibasilar lung opacities. No pulmonary edema. IMPRESSION: 1. Well-positioned support structures. 2. Mild hazy patchy bibasilar lung opacities, suspect atelectasis or aspiration. Electronically Signed   By: Ilona Sorrel M.D.   On: 12/03/2020 06:30     Assessment: 30 year old female presenting with new onset of GTC seizure activity, preceded by AMS. Markedly elevated BP on arrival to the ED.  1. Exam reveals an encephalopathic patient with no clinical seizure activity following Keppra load.  2. CT imaging negative for acute abnormality.  3. No electrolyte abnormality to explain her seizures.  4. Hypertension improving.  5. DDx for underlying etiology for her presentation includes hypertensive emergency or PRES.   6. Received 1500 mg IV Keppra load in the ED.    Recommendations: 1. STAT MRI brain 2. STAT EEG .  3. Continue Keppra scheduled dosing at 500 mg q12h.  4. IV high dose thiamine 5. Transferring to [facility] ICU 6. Inpatient seizure precautions 7. Frequent neuro checks.  8. BP management with SBP goal of < 160.   40 minutes spent in the emergent neurological evaluation and management of this critically ill patient.   Addendum: - EEG with continuous generalized slowing and excessive beta activity. The study is suggestive of severe diffuse encephalopathy, nonspecific etiology but likely related to sedation. No seizures or epileptiform discharges were seen throughout the recording - MRI brain reveals fairly symmetric patchy cortical/subcortical T2/FLAIR hyperintense signal abnormality within  the bilateral frontal lobes, parietooccipital lobes and posterior temporal lobes. Additional patchy foci of T2/FLAIR hyperintense signal abnormality within the posterior limbs of the internal capsules and bilateral basal ganglia bilaterally. 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 Alternative etiologies (including cerebritis) can not be excluded. Clinical correlation is recommended. Additionally, a follow-up brain MRI is recommended to assess for stability, resolution or progression of these signal changes.  Electronically signed: Dr. Kerney Elbe 12/03/2020, 11:48 AM

## 2020-12-03 NOTE — ED Triage Notes (Signed)
Pt came in with c/o seizure. Pulled out of car by this nurse and tech. Pt actively seizing when pulled out of the car. Pt is currently postictal and confused. HR 140.

## 2020-12-03 NOTE — ED Notes (Signed)
Family at bedside. 

## 2020-12-03 NOTE — H&P (Signed)
NAME:  [name], MRN:  [id number], DOB:  December 29, 1990, LOS: 0 ADMISSION DATE:  12/03/2020, CONSULTATION DATE: 12/03/2020 REFERRING MD: Judeth Porch, CHIEF COMPLAINT: Encephalopathy  History of Present Illness:  30 year old with multiple chronic medical conditions who had recurrent nausea and vomiting overnight, concern for encephalopathy by husband, some flexor posturing prior to arrival he was intubated in the ED for combativeness.  History per husband at bedside.  Patient is intubated and sedated.  Patient has chronic nausea and vomiting.  Seems chalked up to gastroparesis.  Notably gastric emptying study normal 2021.  Has active over the last several days.  Was in 15/1 for similar.  Sent home.  Was worse yesterday through the night.  Woke up in the night vomiting multiple times.  After, she seemed out of it.  Kind of looking through her husband, not acknowledging him.  Did recognize photo of their daughter.  Worked on getting her to the emergency room.  They are at the door to go home and she slumped down and had what is described as flexing posturing of the upper extremities.  Lasted about 60 seconds.  He went to get help.  Immediately thereafter she was somewhat hysterical, screaming, scared.  They came to the ED.  She was combative per ED notes.  Was intubated.  Labs notable for sugar in the 300s with anion gap of 18, normal lactate.  Hypokalemic.  Creatinine improved to 1.4 from 2 few days prior.  Creatinine not far off baseline.  Chest x-ray appears clear.  CT abdomen pelvis with anasarca, ascites.  CT chest on my interpretation reveals bibasilar atelectasis, faint groundglass opacity scattered in the lingula/left upper lobe.  Neurology was consulted.  Broad differential.  Feel she be better served at [facility] given greater neurology capacity, diagnostic test.  Stat MRI and stat spot EEG ordered but not resulted at time of note.  Pertinent  Medical History  Type 1 diabetes, anxiety, chronic nausea  vomiting, BKA, foot infection recent completed IV antibiotics with PICC line in place  Significant Hospital Events: Including procedures, antibiotic start and stop dates in addition to other pertinent events   9/5 admitted with concern for seizure activity, intubated, neurology recommended transfer to [facility], hyperglycemic concern for DKA on arrival  Interim History / Subjective:    Objective   Blood pressure (!) 156/93, pulse (!) 104, temperature (!) 97.1 F (36.2 C), resp. rate 17, height 5' 7"  (1.702 m), SpO2 100 %.    Vent Mode: PRVC FiO2 (%):  [100 %] 100 % Set Rate:  [16 bmp] 16 bmp Vt Set:  [500 mL] 500 mL PEEP:  [5 cmH20] 5 cmH20 Plateau Pressure:  [14 cmH20-15 cmH20] 14 cmH20   Intake/Output Summary (Last 24 hours) at 12/03/2020 0814 Last data filed at 12/03/2020 0825 Gross per 24 hour  Intake 1155.17 ml  Output --  Net 1155.17 ml   There were no vitals filed for this visit.  Examination: General: Intubated, sedated HENT: No icterus Lungs: Clear, ventilated sounds Cardiovascular: Tachycardic, regular rhythm Abdomen: Nondistended bowel sounds present Extremities: Edematous, warm, PICC in right upper extremity from recent IV antibiotics clean dry and intact Neuro: Pupils equal round reactive to light, no nystagmus, sedated   Resolved Hospital Problem list     Assessment & Plan:  Toxic metabolic encephalopathy: Following several episodes of emesis.  Description of the events following do not sound consistent with seizure.  Concern for press given she is hypertensive at baseline as well as  hypertensive on arrival to the ED.  THC positive, unclear if other toxins have been used, quick or rapid talk screen negative although misses many other potential toxins.  In addition she is hyperglycemic which could be contributing, possible HHS in the setting of hypovolemia due to vomiting. --Neurology consult, recommend transfer to [facility] on [address]. for further evaluation --Stat EEG,  MRI ordered, likely will need continuous EEG once transferred --Wean sedation as able --Status post Keppra load 0800 9/5 --Resumed home Ativan 0.5 twice daily to prevent vent potential withdrawal, alter as needed  DKA: Anion gap 18, sugar over 300, lactate normal. --Insulin drip per protocol, type I --LR D5 --Given mild elevation in anion gap, will hold off on IV fluid boluses given report of anasarca on CT scan and volume overload on exam --Serial BMPs to assess for closure of gap, transition to subcu once gap closes  Hypokalemia: In the setting of nausea vomiting.  Suspect will worsen in the setting of insulin administration --40 mEq p.o. x1 now and again in 4 hrs, 60 mEq IV run over 6 hours also ordered  Acute hypoxemic respiratory failure: In setting of combativeness, intubated in the ED.  CT chest with bilateral basilar dependent atelectasis and faint grade glass opacity in the lingula/left upper lobe.  At risk for aspiration given recurrent episodes of vomiting. --PRVC --VAP bundle --RASS -1 to -2, lighten as able per neurology recommendations --Protonix for stress ulcer prophylaxis --No antibiotics for now, consider addition of antibiotics as indicated in the future if clinical status changes  Nausea and vomiting: Unclear etiology.  Presumed gastroparesis but notably gastric emptying study 2021 was normal. --Continue Reglan, QTC acceptable on admission --Zofran as needed  Hypertension: Appears poorly controlled.  On 3 medicines at home. --Continue home amlodipine, Coreg, hold home hydralazine --BPs normalized after intubation on high doses of propofol, recommend weaning propofol dose if blood pressures become soft, suspect her blood pressures will be difficult to control once off propofol or on reduced dose of propofol  Best Practice (right click and "Reselect all SmartList Selections" daily)   Diet/type: NPO DVT prophylaxis: prophylactic heparin  GI prophylaxis: PPI Lines:  Central line and yes and it is still needed Foley:  N/A Code Status:  full code Last date of multidisciplinary goals of care discussion [n/a]  Labs   CBC: Recent Labs  Lab 11/29/20 1245 12/03/20 0516 12/03/20 0847  WBC 10.1 10.1 7.4  NEUTROABS 8.2* 7.4  --   HGB 9.4* 10.9* 9.2*  HCT 30.4* 34.4* 28.2*  MCV 94.7 92.0 90.1  PLT 286 309 147    Basic Metabolic Panel: Recent Labs  Lab 11/29/20 1245 12/03/20 0516  NA 142 142  K 4.1 3.2*  CL 110 106  CO2 18* 18*  GLUCOSE 210* 314*  BUN 36* 23*  CREATININE 2.00* 1.45*  CALCIUM 9.0 8.8*   GFR: Estimated Creatinine Clearance: 54.7 mL/min (A) (by C-G formula based on SCr of 1.45 mg/dL (H)). Recent Labs  Lab 11/29/20 1245 12/03/20 0516 12/03/20 0630 12/03/20 0847  WBC 10.1 10.1  --  7.4  LATICACIDVEN  --   --  1.0  --     Liver Function Tests: Recent Labs  Lab 11/29/20 1245 12/03/20 0516  AST 77* 27  ALT 21 11  ALKPHOS 223* 155*  BILITOT 0.8 1.1  PROT 8.3* 7.4  ALBUMIN 2.6* 2.5*   Recent Labs  Lab 11/29/20 1245  LIPASE 19   No results for input(s): AMMONIA in the last  168 hours.  ABG    Component Value Date/Time   PHART 7.439 12/03/2020 0637   PCO2ART 29.6 (L) 12/03/2020 0637   PO2ART 497 (H) 12/03/2020 0637   HCO3 19.8 (L) 12/03/2020 0637   TCO2 28 05/17/2020 1233   ACIDBASEDEF 2.8 (H) 12/03/2020 0637   O2SAT 100.0 12/03/2020 0637     Coagulation Profile: No results for input(s): INR, PROTIME in the last 168 hours.  Cardiac Enzymes: Recent Labs  Lab 12/03/20 0516  CKTOTAL 93    HbA1C: Hgb A1c MFr Bld  Date/Time Value Ref Range Status  11/13/2020 09:57 AM 7.2 (H) 4.8 - 5.6 % Final    Comment:    (NOTE) Pre diabetes:          5.7%-6.4%  Diabetes:              >6.4%  Glycemic control for   <7.0% adults with diabetes   06/04/2020 10:05 PM 6.9 (H) 4.8 - 5.6 % Final    Comment:    (NOTE) Pre diabetes:          5.7%-6.4%  Diabetes:              >6.4%  Glycemic control for    <7.0% adults with diabetes     CBG: Recent Labs  Lab 11/29/20 1051 11/29/20 1601 12/03/20 0458 12/03/20 0826  GLUCAP 182* 211* 255* 270*    Review of Systems:   Unable to obtain as intubated/sedated  Past Medical History:  She,  has a past medical history of Allergy, Anemia, Anxiety, Blood transfusion without reported diagnosis, Cataract, COVID-19 (03/2020), Depression, Diabetes type 1, uncontrolled ([place]) (11/14/2011), Fibromyalgia, Gastroparesis, GERD (gastroesophageal reflux disease), Hypertension, and Infection.   Surgical History:   Past Surgical History:  Procedure Laterality Date   AMPUTATION Right 04/13/2020   Procedure: 1ST RAY AMPUTATION RIGHT FOOT;  Surgeon: Newt Minion, MD;  Location: [place];  Service: Orthopedics;  Laterality: Right;   AMPUTATION Right 06/06/2020   Procedure: RIGHT BELOW KNEE AMPUTATION;  Surgeon: Newt Minion, MD;  Location: [place];  Service: Orthopedics;  Laterality: Right;   AMPUTATION Left    great toe   ANKLE SURGERY     APPLICATION OF WOUND VAC Right 06/06/2020   Procedure: APPLICATION OF WOUND VAC;  Surgeon: Newt Minion, MD;  Location: [place];  Service: Orthopedics;  Laterality: Right;   CHOLECYSTECTOMY  11/15/2011   Procedure: LAPAROSCOPIC CHOLECYSTECTOMY WITH INTRAOPERATIVE CHOLANGIOGRAM;  Surgeon: [name], MD;  Location: [facility];  Service: General;  Laterality: N/A;   COLONOSCOPY     COLONOSCOPY WITH PROPOFOL N/A 06/27/2017   Procedure: COLONOSCOPY WITH PROPOFOL;  Surgeon: Milus Banister, MD;  Location: [facility];  Service: Endoscopy;  Laterality: N/A;   ESOPHAGOGASTRODUODENOSCOPY  12/03/2011   Procedure: ESOPHAGOGASTRODUODENOSCOPY (EGD);  Surgeon: Beryle Beams, MD;  Location: [facility];  Service: Endoscopy;  Laterality: N/A;   FLEXIBLE SIGMOIDOSCOPY N/A 03/10/2017   Procedure: FLEXIBLE SIGMOIDOSCOPY;  Surgeon: Carol Ada, MD;  Location: [facility];  Service: Endoscopy;  Laterality: N/A;   INCISION AND  DRAINAGE PERIRECTAL ABSCESS N/A 03/01/2017   Procedure: IRRIGATION AND DEBRIDEMENT PERIRECTAL ABSCESS;  Surgeon: Alphonsa Overall, MD;  Location: [facility];  Service: General;  Laterality: N/A;   IRRIGATION AND DEBRIDEMENT BUTTOCKS N/A 03/23/2017   Procedure: IRRIGATION AND DEBRIDEMENT BUTTOCKS, SETON PLACEMENT;  Surgeon: [name], MD;  Location: [facility];  Service: General;  Laterality: N/A;   LAPAROSCOPY  11/23/2011   Procedure: LAPAROSCOPY DIAGNOSTIC;  Surgeon: Marland Kitchen  Edward Jolly, MD;  Location: [facility];  Service: General;  Laterality: N/A;   SIGMOIDOSCOPY     UPPER GASTROINTESTINAL ENDOSCOPY     WISDOM TOOTH EXTRACTION       Social History:   reports that she has never smoked. She has never used smokeless tobacco. She reports that she does not currently use drugs after having used the following drugs: Marijuana. She reports that she does not drink alcohol.   Family History:  Her family history includes Breast cancer in her paternal aunt; Colon cancer in her paternal grandmother; Diabetes in her maternal grandfather, maternal grandmother, mother, paternal grandfather, paternal grandmother, and another family member; Hypertension in her father. There is no history of Esophageal cancer, Liver cancer, Pancreatic cancer, Stomach cancer, or Rectal cancer.   Allergies Allergies  Allergen Reactions   Other Anaphylaxis    Reaction to [place] nuts    Lactose Intolerance (Gi) Diarrhea     Home Medications  Prior to Admission medications   Medication Sig Start Date End Date Taking? Authorizing Provider  Albuterol Sulfate (PROAIR RESPICLICK) 932 (90 Base) MCG/ACT AEPB Inhale 2 puffs into the lungs daily as needed. Patient taking differently: Inhale 2 puffs into the lungs daily as needed (wheezing). 11/08/20 12/08/20 Yes Allwardt, Alyssa M, PA-C  amLODipine (NORVASC) 10 MG tablet Take 1 tablet (10 mg total) by mouth daily. 11/08/20  Yes Allwardt, Alyssa M, PA-C  busPIRone (BUSPAR) 5 MG tablet Take 1  tablet (5 mg total) by mouth 3 (three) times daily. 02/03/20  Yes [name], MD  Carboxymethylcell-Hypromellose (GENTEAL) 0.25-0.3 % GEL Place 1 drop into both eyes daily as needed (dryeyes).   Yes [provider]  carvedilol (COREG) 12.5 MG tablet Take 1 tablet (12.5 mg total) by mouth 2 (two) times daily with a meal. 04/16/20 02/02/21 Yes Dahal, Marlowe Aschoff, MD  cyclobenzaprine (FLEXERIL) 10 MG tablet Take 1 tablet (10 mg total) by mouth 3 (three) times daily as needed for muscle spasms. 11/15/20  Yes [name], MD  dicyclomine (BENTYL) 20 MG tablet Take 1 tablet (20 mg total) by mouth every 8 (eight) hours as needed for spasms. 11/25/19  Yes Petrucelli, Samantha R, PA-C  EPINEPHrine 0.3 mg/0.3 mL IJ SOAJ injection Inject 0.3 mg into the muscle once as needed (anaphylaxis/allergic reaction). Patient taking differently: Inject 0.3 mg into the muscle once as needed for anaphylaxis. 07/16/20  Yes [name], MD  gabapentin (NEURONTIN) 100 MG capsule Take 200 mg by mouth 3 (three) times daily. 11/02/20  Yes [provider]  hydrALAZINE (APRESOLINE) 50 MG tablet Take 100 mg by mouth 2 (two) times daily. 09/14/20  Yes [provider]  insulin glargine (LANTUS) 100 UNIT/ML injection Inject 0.08 mLs (8 Units total) into the skin daily. 11/05/20  Yes [place] Eric J, DO  insulin lispro (HUMALOG) 100 UNIT/ML injection Inject 0-5 Units into the skin 3 (three) times daily before meals. Sliding scale   Yes [provider]  loperamide (IMODIUM) 2 MG capsule Take 6 mg by mouth daily as needed for diarrhea or loose stools.    Yes [provider]  LORazepam (ATIVAN) 0.5 MG tablet Take 1 tablet (0.5 mg total) by mouth 2 (two) times daily. 11/15/20  Yes [name], MD  metoCLOPramide (REGLAN) 5 MG tablet Take 1 tablet (5 mg total) by mouth 3 (three) times daily before meals. Please make and keep an appointment for further refills. 11/13/20 02/11/21 Yes [place] Eric J, DO   ondansetron (ZOFRAN ODT)  4 MG disintegrating tablet Take 1 tablet (4 mg total) by mouth every 8 (eight) hours as needed for nausea or vomiting. 11/08/20  Yes Allwardt, Alyssa M, PA-C  oxyCODONE-acetaminophen (PERCOCET) 10-325 MG tablet Take 1 tablet by mouth every 4 (four) hours as needed for pain. 11/27/20  Yes [name], MD  traZODone (DESYREL) 50 MG tablet Take 0.5-1 tablets (25-50 mg total) by mouth at bedtime as needed for sleep. 11/15/20  Yes [name], MD  azithromycin (ZITHROMAX) 250 MG tablet Take 1 tablet (250 mg total) by mouth daily. Take first 2 tablets together, then 1 every day until finished. Patient not taking: No sig reported 11/19/20   Luna Fuse, MD  Continuous Blood Gluc Sensor (DEXCOM G6 SENSOR) MISC USE AS DIRECTED EVERY 10 DAYS 07/28/20   Renato Shin, MD  Continuous Blood Gluc Transmit (DEXCOM G6 TRANSMITTER) MISC USE AS DIRECTED TO  MONITOR  GLUCOSE  LEVELS 05/10/20   Renato Shin, MD  furosemide (LASIX) 40 MG tablet TAKE 1 TABLET (40 MG TOTAL) BY MOUTH DAILY. Patient not taking: Reported on 12/03/2020 05/24/20 05/24/21  Mercy Riding, MD  Insulin Pen Needle 32G X 4 MM MISC USE AS DIRECTED WITH INSULIN PENS 05/24/20 05/24/21  Mercy Riding, MD  mirtazapine (REMERON) 30 MG tablet TAKE 1 TABLET BY MOUTH AT  BEDTIME Patient not taking: Reported on 12/03/2020 01/17/20   [name], MD  pantoprazole (PROTONIX) 40 MG tablet Take 1 tablet (40 mg total) by mouth 2 (two) times daily. Patient not taking: No sig reported 12/06/19   Armbruster, Carlota Raspberry, MD  potassium chloride SA (KLOR-CON) 20 MEQ tablet Take 1 tablet (20 mEq total) by mouth daily for 5 doses. 07/04/19 07/30/19  Janeece Fitting, PA-C     Critical care time:     CRITICAL CARE Performed by: Lanier Clam   Total critical care time: 60 minutes  Critical care time was exclusive of separately billable procedures and treating other patients.  Critical care was necessary to treat or prevent imminent or  life-threatening deterioration.  Critical care was time spent personally by me on the following activities: development of treatment plan with patient and/or surrogate as well as nursing, discussions with consultants, evaluation of patient's response to treatment, examination of patient, obtaining history from patient or surrogate, ordering and performing treatments and interventions, ordering and review of laboratory studies, ordering and review of radiographic studies, pulse oximetry and re-evaluation of patient's condition.

## 2020-12-03 NOTE — Progress Notes (Signed)
Started cEEG study.  Notified [facility] monitoring.  Tested patient event button.

## 2020-12-03 NOTE — ED Notes (Signed)
Pt in CT. Vitals monitored continuously. B/P 200s/ 130s continuously despite propofol titration. Provider made aware and this nurse notified to go up on propofol.

## 2020-12-04 ENCOUNTER — Inpatient Hospital Stay (HOSPITAL_COMMUNITY): Payer: Medicaid Other

## 2020-12-04 DIAGNOSIS — I6783 Posterior reversible encephalopathy syndrome: Secondary | ICD-10-CM | POA: Diagnosis not present

## 2020-12-04 DIAGNOSIS — E440 Moderate protein-calorie malnutrition: Secondary | ICD-10-CM | POA: Insufficient documentation

## 2020-12-04 DIAGNOSIS — I161 Hypertensive emergency: Secondary | ICD-10-CM | POA: Diagnosis not present

## 2020-12-04 DIAGNOSIS — R569 Unspecified convulsions: Secondary | ICD-10-CM | POA: Diagnosis not present

## 2020-12-04 DIAGNOSIS — E1010 Type 1 diabetes mellitus with ketoacidosis without coma: Principal | ICD-10-CM

## 2020-12-04 DIAGNOSIS — R410 Disorientation, unspecified: Secondary | ICD-10-CM | POA: Diagnosis not present

## 2020-12-04 LAB — BASIC METABOLIC PANEL
Anion gap: 5 (ref 5–15)
Anion gap: 5 (ref 5–15)
BUN: 23 mg/dL — ABNORMAL HIGH (ref 6–20)
BUN: 23 mg/dL — ABNORMAL HIGH (ref 6–20)
CO2: 24 mmol/L (ref 22–32)
CO2: 24 mmol/L (ref 22–32)
Calcium: 7.4 mg/dL — ABNORMAL LOW (ref 8.9–10.3)
Calcium: 7.4 mg/dL — ABNORMAL LOW (ref 8.9–10.3)
Chloride: 109 mmol/L (ref 98–111)
Chloride: 109 mmol/L (ref 98–111)
Creatinine, Ser: 1.54 mg/dL — ABNORMAL HIGH (ref 0.44–1.00)
Creatinine, Ser: 1.74 mg/dL — ABNORMAL HIGH (ref 0.44–1.00)
GFR, Estimated: 46 mL/min — ABNORMAL LOW (ref >60)
GFR, Estimated: 40 mL/min — ABNORMAL LOW (ref >60)
Glucose, Bld: 103 mg/dL — ABNORMAL HIGH (ref 70–99)
Glucose, Bld: 90 mg/dL (ref 70–99)
Potassium: 2.9 mmol/L — ABNORMAL LOW (ref 3.5–5.1)
Potassium: 3.2 mmol/L — ABNORMAL LOW (ref 3.5–5.1)
Sodium: 138 mmol/L (ref 135–145)
Sodium: 138 mmol/L (ref 135–145)

## 2020-12-04 LAB — GLUCOSE, CAPILLARY
Glucose-Capillary: 102 mg/dL — ABNORMAL HIGH (ref 70–99)
Glucose-Capillary: 103 mg/dL — ABNORMAL HIGH (ref 70–99)
Glucose-Capillary: 104 mg/dL — ABNORMAL HIGH (ref 70–99)
Glucose-Capillary: 111 mg/dL — ABNORMAL HIGH (ref 70–99)
Glucose-Capillary: 117 mg/dL — ABNORMAL HIGH (ref 70–99)
Glucose-Capillary: 34 mg/dL — CL (ref 70–99)
Glucose-Capillary: 35 mg/dL — CL (ref 70–99)
Glucose-Capillary: 40 mg/dL — CL (ref 70–99)
Glucose-Capillary: 43 mg/dL — CL (ref 70–99)
Glucose-Capillary: 53 mg/dL — ABNORMAL LOW (ref 70–99)
Glucose-Capillary: 55 mg/dL — ABNORMAL LOW (ref 70–99)
Glucose-Capillary: 60 mg/dL — ABNORMAL LOW (ref 70–99)
Glucose-Capillary: 70 mg/dL (ref 70–99)
Glucose-Capillary: 72 mg/dL (ref 70–99)
Glucose-Capillary: 85 mg/dL (ref 70–99)
Glucose-Capillary: 87 mg/dL (ref 70–99)
Glucose-Capillary: 88 mg/dL (ref 70–99)
Glucose-Capillary: 91 mg/dL (ref 70–99)
Glucose-Capillary: 99 mg/dL (ref 70–99)

## 2020-12-04 LAB — CBC
HCT: 23.6 % — ABNORMAL LOW (ref 36.0–46.0)
Hemoglobin: 7.5 g/dL — ABNORMAL LOW (ref 12.0–15.0)
MCH: 28.7 pg (ref 26.0–34.0)
MCHC: 31.8 g/dL (ref 30.0–36.0)
MCV: 90.4 fL (ref 80.0–100.0)
Platelets: 168 10*3/uL (ref 150–400)
RBC: 2.61 MIL/uL — ABNORMAL LOW (ref 3.87–5.11)
RDW: 14.7 % (ref 11.5–15.5)
WBC: 7.7 10*3/uL (ref 4.0–10.5)
nRBC: 0 % (ref 0.0–0.2)

## 2020-12-04 LAB — BETA-HYDROXYBUTYRIC ACID: Beta-Hydroxybutyric Acid: 0.05 mmol/L (ref 0.05–0.27)

## 2020-12-04 LAB — SARS CORONAVIRUS 2 (TAT 6-24 HRS): SARS Coronavirus 2: POSITIVE — AB

## 2020-12-04 LAB — HEMOGLOBIN AND HEMATOCRIT, BLOOD
HCT: 25.6 % — ABNORMAL LOW (ref 36.0–46.0)
Hemoglobin: 8.1 g/dL — ABNORMAL LOW (ref 12.0–15.0)

## 2020-12-04 LAB — MAGNESIUM: Magnesium: 1.3 mg/dL — ABNORMAL LOW (ref 1.7–2.4)

## 2020-12-04 MED ORDER — DEXTROSE 50 % IV SOLN
25.0000 g | INTRAVENOUS | Status: AC
  Administered 2020-12-04: 25 g via INTRAVENOUS
  Filled 2020-12-04: qty 50

## 2020-12-04 MED ORDER — DEXTROSE 50 % IV SOLN: 25.0000 g | INTRAVENOUS | Status: AC

## 2020-12-04 MED ORDER — DEXTROSE 50 % IV SOLN
INTRAVENOUS | Status: AC
  Filled 2020-12-04: qty 50

## 2020-12-04 MED ORDER — DEXTROSE 50 % IV SOLN
INTRAVENOUS | Status: AC
  Administered 2020-12-04: 50 mL
  Filled 2020-12-04: qty 50

## 2020-12-04 MED ORDER — DEXTROSE 50 % IV SOLN
25.0000 g | INTRAVENOUS | Status: AC
  Administered 2020-12-04: 25 g via INTRAVENOUS

## 2020-12-04 MED ORDER — POTASSIUM CHLORIDE 10 MEQ/100ML IV SOLN
10.0000 meq | INTRAVENOUS | Status: AC
  Administered 2020-12-04 (×3): 10 meq via INTRAVENOUS
  Filled 2020-12-04 (×3): qty 100

## 2020-12-04 MED ORDER — SODIUM CHLORIDE 0.9% FLUSH
10.0000 mL | Freq: Two times a day (BID) | INTRAVENOUS | Status: DC
  Administered 2020-12-04 (×2): 10 mL
  Administered 2020-12-05: 20 mL
  Administered 2020-12-06 – 2020-12-10 (×8): 10 mL

## 2020-12-04 MED ORDER — FENTANYL CITRATE (PF) 100 MCG/2ML IJ SOLN
25.0000 ug | INTRAMUSCULAR | Status: DC | PRN
  Administered 2020-12-04 – 2020-12-05 (×2): 25 ug via INTRAVENOUS
  Filled 2020-12-04 (×2): qty 2

## 2020-12-04 MED ORDER — VITAL HIGH PROTEIN PO LIQD
1000.0000 mL | ORAL | Status: DC
  Administered 2020-12-04: 1000 mL

## 2020-12-04 MED ORDER — DEXTROSE 50 % IV SOLN: 1.0000 | Freq: Once | INTRAVENOUS | Status: DC

## 2020-12-04 MED ORDER — POTASSIUM CHLORIDE 20 MEQ PO PACK
40.0000 meq | PACK | Freq: Once | ORAL | Status: AC
  Administered 2020-12-04: 40 meq
  Filled 2020-12-04: qty 2

## 2020-12-04 MED ORDER — SODIUM CHLORIDE 0.9% FLUSH
10.0000 mL | INTRAVENOUS | Status: DC | PRN
  Administered 2020-12-04 – 2020-12-05 (×3): 10 mL

## 2020-12-04 MED ORDER — SODIUM CHLORIDE 0.9 % IV SOLN: INTRAVENOUS | Status: DC | PRN

## 2020-12-04 MED ORDER — DEXTROSE 50 % IV SOLN
INTRAVENOUS | Status: AC
  Administered 2020-12-04: 25 g via INTRAVENOUS
  Filled 2020-12-04: qty 50

## 2020-12-04 MED ORDER — INSULIN DETEMIR 100 UNIT/ML ~~LOC~~ SOLN
5.0000 [IU] | Freq: Every day | SUBCUTANEOUS | Status: DC
  Administered 2020-12-04: 5 [IU] via SUBCUTANEOUS
  Filled 2020-12-04 (×2): qty 0.05

## 2020-12-04 MED ORDER — DEXTROSE 10 % IV SOLN: INTRAVENOUS | Status: DC

## 2020-12-04 MED ORDER — KCL-LACTATED RINGERS-D5W 20 MEQ/L IV SOLN
INTRAVENOUS | Status: DC
  Filled 2020-12-04 (×2): qty 1000

## 2020-12-04 MED ORDER — INSULIN ASPART 100 UNIT/ML IJ SOLN
0.0000 [IU] | INTRAMUSCULAR | Status: DC
  Administered 2020-12-05: 2 [IU] via SUBCUTANEOUS
  Administered 2020-12-05: 1 [IU] via SUBCUTANEOUS
  Administered 2020-12-06: 2 [IU] via SUBCUTANEOUS
  Administered 2020-12-06: 1 [IU] via SUBCUTANEOUS
  Administered 2020-12-06: 2 [IU] via SUBCUTANEOUS
  Administered 2020-12-06: 1 [IU] via SUBCUTANEOUS
  Administered 2020-12-06: 3 [IU] via SUBCUTANEOUS
  Administered 2020-12-06: 2 [IU] via SUBCUTANEOUS
  Administered 2020-12-07: 1 [IU] via SUBCUTANEOUS

## 2020-12-04 MED ORDER — MAGNESIUM SULFATE 4 GM/100ML IV SOLN
4.0000 g | Freq: Once | INTRAVENOUS | Status: AC
  Administered 2020-12-04: 4 g via INTRAVENOUS
  Filled 2020-12-04: qty 100

## 2020-12-04 NOTE — Progress Notes (Addendum)
NAME:  [name], MRN:  [id number], DOB:  [birth year], LOS: 1 ADMISSION DATE:  12/03/2020, CONSULTATION DATE: 12/03/2020 REFERRING MD: Judeth Porch, CHIEF COMPLAINT: Encephalopathy  History of Present Illness:  30 year old with multiple chronic medical conditions who had recurrent nausea and vomiting overnight, concern for encephalopathy by husband, some flexor posturing prior to arrival he was intubated in the ED for combativeness.  History per husband at bedside.  Patient is intubated and sedated.  Patient has chronic nausea and vomiting.  Seems chalked up to gastroparesis.  Notably gastric emptying study normal 2021.  Has active over the last several days.  Was in 44/1 for similar.  Sent home.  Was worse yesterday through the night.  Woke up in the night vomiting multiple times.  After, she seemed out of it.  Kind of looking through her husband, not acknowledging him.  Did recognize photo of their daughter.  Worked on getting her to the emergency room.  They are at the door to go home and she slumped down and had what is described as flexing posturing of the upper extremities.  Lasted about 60 seconds.  He went to get help.  Immediately thereafter she was somewhat hysterical, screaming, scared.  They came to the ED.  She was combative per ED notes.  Was intubated.  Labs notable for sugar in the 300s with anion gap of 18, normal lactate.  Hypokalemic.  Creatinine improved to 1.4 from 2 few days prior.  Creatinine not far off baseline.  Chest x-ray appears clear.  CT abdomen pelvis with anasarca, ascites.  CT chest on my interpretation reveals bibasilar atelectasis, faint groundglass opacity scattered in the lingula/left upper lobe.  Neurology was consulted.  Broad differential.  Feel she be better served at [facility] given greater neurology capacity, diagnostic test.  Stat MRI and stat spot EEG ordered but not resulted at time of note.  Pertinent  Medical History  Type 1 diabetes, anxiety, chronic nausea  vomiting, BKA, foot infection recent completed IV antibiotics with PICC line in place  Significant Hospital Events: Including procedures, antibiotic start and stop dates in addition to other pertinent events   9/5 admitted with concern for seizure activity, intubated, neurology recommended transfer to [facility] hyperglycemic concern for DKA on arrival  Imaging:  - MRI 9/5 consistent with PRES - EEG 9/6 Excessive beta, continuous slow. Sever encephalopathy likely related to sedation   Interim History / Subjective:  Sedated, no acute events overnight.  Objective   Blood pressure 108/75, pulse 71, temperature 97.7 F (36.5 C), temperature source Bladder, resp. rate 16, height 5' 7"  (1.702 m), weight 70.4 kg, SpO2 100 %.    Vent Mode: PRVC FiO2 (%):  [40 %] 40 % Set Rate:  [16 bmp] 16 bmp Vt Set:  [490 mL] 490 mL PEEP:  [5 cmH20] 5 cmH20 Plateau Pressure:  [12 cmH20-16 cmH20] 15 cmH20   Intake/Output Summary (Last 24 hours) at 12/04/2020 1235 Last data filed at 12/04/2020 1100 Gross per 24 hour  Intake 4236.55 ml  Output 745 ml  Net 3491.55 ml    Filed Weights   12/03/20 2000  Weight: 70.4 kg    Examination:  General:young adult female on vent HENT: [place]/AT, PERRL, no JVD Lungs: Clear bilateral breath sounds Cardiovascular: RRR, no MRG Abdomen: Non-distended, normoactive.  Extremities: L great toe amputation remote, R BKA remote.  Neuro: sedated RASS -4   Resolved Hospital Problem list     Assessment & Plan:   Toxic metabolic encephalopathy:  multifactorial. Hypertensive in ED with MRI suggestive of PRES. DKA. ? Substance abuse (THC positive in ED) Seizure like activity: witnessed ine ED.  --Neurology following --EEG ongoing --Neurology would like to examine patient with propofol weaned to 30, RN aware.  --Keppra ongoing per neuro --Home Ativan 0.5 twice daily continued to prevent vent potential withdrawal, alter as needed  DKA: Anion gap 18, sugar over 300,  lactate normal. Transitioned off infusion overnight. Levemir 21 units given. Has had some hypoglycemia this morning.  --Continue d5 for now. Will start TF at which point we can hopefully discontinue. Will trickle feeds at first with history of gastroparesis.  --Decrease levemir to 49m, change ssi to sensitive. Hold if glucose still low this evening.  --Serial BMPs to assess for closure of gap, transition to subcu once gap closes  Hypokalemia: In the setting of nausea vomiting.  Suspect will worsen in the setting of insulin administration Hypomag - K and mag being repleted.   Acute hypoxemic respiratory failure: In setting of combativeness, intubated in the ED.  CT chest with bilateral basilar dependent atelectasis and faint grade glass opacity in the lingula/left upper lobe.  At risk for aspiration given recurrent episodes of vomiting. --PRVC --VAP bundle --RASS -1 to -2, lighten as able per neurology recommendations --Protonix for stress ulcer prophylaxis --No antibiotics for now, consider addition of antibiotics as indicated in the future if clinical status changes  Nausea and vomiting: Unclear etiology.  Presumed gastroparesis but notably gastric emptying study 2021 was normal. --Continue Reglan, QTC acceptable on admission --Zofran as needed  Anemia: hemoglobin drop from 9.2 to 7.5 overnight. Hemodilution? No obvious blood loss.  -Repeat H&H this afternooon.   Hypertension: Appears poorly controlled.  On 3 medicines at home. --Continue home amlodipine, Coreg. DC hydralazine as she is having some low pressures.   Best Practice (right click and "Reselect all SmartList Selections" daily)   Diet/type: NPO DVT prophylaxis: prophylactic heparin  GI prophylaxis: PPI Lines: Central line and yes and it is still needed Foley:  N/A Code Status:  full code Last date of multidisciplinary goals of care discussion [n/a]   Critical care time: 38 min    PGeorgann Housekeeper AGACNP-BC LJuncosfor personal pager PCCM on call pager ([phone] 7pm. Please call Elink 7p-7a. [phone] 12/04/2020 12:58 PM

## 2020-12-04 NOTE — Progress Notes (Signed)
eLink Physician-Brief Progress Note Patient Name: [name] DOB: [birth year] MRN: [id number]   Date of Service  12/04/2020  HPI/Events of Note  Patient is on 10 mcg of Propofol gtt while intubated and on the ventilator, she is awaiting extubation, she is however uncomfortable.  eICU Interventions  Fentanyl 25 mcg iv Q 2 hours PRN pain or discomfort added.        Frederik Pear 12/04/2020, 8:47 PM

## 2020-12-04 NOTE — Plan of Care (Signed)
  Problem: Education: °Goal: Knowledge of General Education information will improve °Description: Including pain rating scale, medication(s)/side effects and non-pharmacologic comfort measures °Outcome: Not Progressing °  °Problem: Health Behavior/Discharge Planning: °Goal: Ability to manage health-related needs will improve °Outcome: Not Progressing °  °Problem: Clinical Measurements: °Goal: Ability to maintain clinical measurements within normal limits will improve °Outcome: Not Progressing °Goal: Will remain free from infection °Outcome: Not Progressing °Goal: Diagnostic test results will improve °Outcome: Not Progressing °Goal: Respiratory complications will improve °Outcome: Not Progressing °Goal: Cardiovascular complication will be avoided °Outcome: Not Progressing °  °Problem: Activity: °Goal: Risk for activity intolerance will decrease °Outcome: Not Progressing °  °Problem: Nutrition: °Goal: Adequate nutrition will be maintained °Outcome: Not Progressing °  °Problem: Coping: °Goal: Level of anxiety will decrease °Outcome: Not Progressing °  °Problem: Elimination: °Goal: Will not experience complications related to bowel motility °Outcome: Not Progressing °Goal: Will not experience complications related to urinary retention °Outcome: Not Progressing °  °Problem: Pain Managment: °Goal: General experience of comfort will improve °Outcome: Not Progressing °  °Problem: Safety: °Goal: Ability to remain free from injury will improve °Outcome: Not Progressing °  °Problem: Skin Integrity: °Goal: Risk for impaired skin integrity will decrease °Outcome: Not Progressing °  °Problem: Safety: °Goal: Non-violent Restraint(s) °Outcome: Completed/Met °  °

## 2020-12-04 NOTE — Telephone Encounter (Signed)
Transition Care Management Unsuccessful Follow-up Telephone Call  Date of discharge and from where:  11/29/2020-[facility]  Attempts:  2nd Attempt  Reason for unsuccessful TCM follow-up call:  No answer/busy

## 2020-12-04 NOTE — Progress Notes (Signed)
Initial Nutrition Assessment  DOCUMENTATION CODES:   Non-severe (moderate) malnutrition in context of chronic illness  INTERVENTION:   Initiate trickle tube feeds via OG tube: - Vital High Protein @ 20 ml/hr (480 ml/day)  Trickle tube feeding regimen provides 480 kcal, 42 grams of protein, and 401 ml of H2O.  Trickle tube feeding regimen and current propofol provides 939 total kcal (meets 57% of kcal needs).   RD will monitor for ability to advance tube feeds to goal regimen: - Vital High Protein @ 55 ml/hr (1320 ml/day)  Recommended tube feeding regimen at goal would provide 1320 kcal, 116 grams of protein, and 1104 ml of H2O.  Recommended tube feeding regimen at goal and current propofol would provide 1779 total kcal (meets 100% of kcal and protein needs).   - If pt unable to tolerate gastric feeds (history of gastroparesis), recommend placement of post-pyloric Cortrak tube  NUTRITION DIAGNOSIS:   Moderate Malnutrition related to chronic illness (gastroparesis, chronic nausea/vomiting, uncontrolled T1DM) as evidenced by mild fat depletion, moderate muscle depletion.  GOAL:   Weight gain  MONITOR:   Vent status, Labs, Weight trends, TF tolerance, Skin, I & O's  REASON FOR ASSESSMENT:   Ventilator, Consult Enteral/tube feeding initiation and management (trickle tube feeds)  ASSESSMENT:   30 year old female who presented to the ED on 9/05 with seizures, N/V. PMH of T1DM, anxiety, depression, GERD, gastroparesis, chronic N/V, BKA, HTN. Pt admitted with concern for seizure activity and concern for DKA. Pt required intubation.  Discussed pt with RN and during ICU rounds. Received consult for trickle tube feeding initiation and management. Pt with OG tube in stomach per chest x-ray on 9/05.  Spoke with pt's husband at bedside. He reports that pt oscillates between periods of eating well and good appetite (at least 3 meals a day) and periods of N/V during which pt is not  even able to keep down water or medications. Pt's husband attributes these episodes to gastroparesis "flare-ups." He states that these occur 1-2 times every 1-2 months. Pt does take reglan at home for gastroparesis but he is unsure how frequently pt takes this.  When pt is eating well, she consumes items like pasta and salads with chicken. Pt's husband reports that pt does not have an insulin pump. He reports that pt estimates the carbs she consumes and doses insulin appropriate. Pt's typical blood sugars run between 150-200 at home.  Pt's husband reports that when pt first started struggling with gastroparesis, her weight dropped down to the low 100's. He reports that pt's weight now fluctuates between 130-140 lbs if she is having active gastroparesis symptoms and 150-160 lbs if she is not having gastroparesis symptoms. Reviewed weight history in chart and weight fluctuations noted. Noted pt with weight gain from January 2022 through February 2022. Since March 2022, pt has lost a total of 4.4 kg. This is a 5.9% weight loss in 5 months which is not significant for timeframe. Pt with non-pitting edema to BUE so unsure of pt's true dry weight at this time.  CCM would like to initiate trickle tube feeds via OG tube. Reviewed RN notes from pt's previous admissions. Pt has previously tolerated tube feeds via OG tube. CCM will assess for ability to advance tube feeds to goal tomorrow as long as pt is tolerating trickle tube feeds. If pt unable to tolerate trickle tube feeds via OG, recommend placement of post-pyloric Cortrak tube.  RD will monitor for pt progression and ability to provide diet education  regarding gastroparesis.  Patient is currently intubated on ventilator support MV: 7.8 L/min Temp (24hrs), Avg:97.6 F (36.4 C), Min:95.2 F (35.1 C), Max:99.1 F (37.3 C)  Drips: Propofol: 17.4 ml/hr (provides 459 kcal daily from lipid) D5 in LR with KCl: 100 ml/hr  Medications reviewed and include:  SSI q 4 hours, levemir 21 units daily, reglan 5 mg TID, protonix, keppra, IV thiamine, IV magnesium sulfate 4 grams once  Labs reviewed: potassium 3.2, magnesium 1.3, BUN 23, creatinine 1.74, hemoglobin 7.5, HCT 23.6 CBG's: 34-117 x 12 hours  UOP: 700 ml x 24 hours I/O's: +4.3 L since admit  NUTRITION - FOCUSED PHYSICAL EXAM:  Flowsheet Row Most Recent Value  Orbital Region Mild depletion  Upper Arm Region Mild depletion  Thoracic and Lumbar Region Mild depletion  Buccal Region Unable to assess  Temple Region Mild depletion  Clavicle Bone Region Moderate depletion  Clavicle and Acromion Bone Region Moderate depletion  Scapular Bone Region Unable to assess  Dorsal Hand Mild depletion  Patellar Region Mild depletion  Anterior Thigh Region Mild depletion  Posterior Calf Region No depletion  Edema (RD Assessment) Mild  [BUE]  Hair Reviewed  Eyes Unable to assess  Mouth Unable to assess  Skin Reviewed  Nails Reviewed       Diet Order:   Diet Order             Diet NPO time specified  Diet effective now                   EDUCATION NEEDS:   Not appropriate for education at this time  Skin:  Skin Assessment: Skin Integrity Issues: Incisions: L great toe  Last BM:  12/04/20 medium type 6  Height:   Ht Readings from Last 1 Encounters:  12/03/20 5' 7"  (1.702 m)    Weight:   Wt Readings from Last 1 Encounters:  12/03/20 70.4 kg    BMI:  Body mass index is 24.31 kg/m.  Estimated Nutritional Needs:   Kcal:  1650-1850  Protein:  95-115 grams  Fluid:  1.7-1.9 L    Gustavus Bryant, MS, RD, LDN Inpatient Clinical Dietitian Please see [facility] for contact information.

## 2020-12-04 NOTE — Progress Notes (Signed)
LTM EEG checked, Marker button pushed to test, and impedances good. Skin check done= no breakdown seen. Results pending. Charged completed.

## 2020-12-04 NOTE — Progress Notes (Addendum)
Inpatient Diabetes Program Recommendations  AACE/ADA: New Consensus Statement on Inpatient Glycemic Control (2015)  Target Ranges:  Prepandial:   less than 140 mg/dL      Peak postprandial:   less than 180 mg/dL (1-2 hours)      Critically ill patients:  140 - 180 mg/dL   Lab Results  Component Value Date   GLUCAP 88 12/04/2020   HGBA1C 7.2 (H) 11/13/2020    Review of Glycemic Control Results for MARIPOSA, SHORES (MRN [id number]) as of 12/04/2020 11:31  Ref. Range 12/04/2020 07:41 12/04/2020 08:15 12/04/2020 09:28 12/04/2020 10:53  Glucose-Capillary Latest Ref Range: 70 - 99 mg/dL 55 (L) 117 (H) 70 43 (LL)   Diabetes history: Type 1 DM Outpatient Diabetes medications: Lantus 8 units QHS, Humalog 0-5 units TID Current orders for Inpatient glycemic control: Levemir 21 units QD, Novolog 0-15 units Q4H   Inpatient Diabetes Program Recommendations:    Noted hypoglycemia following transition. Familiar with patient given multiple admissions: patient sensitive to insulin.  Consider decreasing Levemir to 5 units Q24H and decreasing correction to Novolog 0-6 units Q4H.   Thanks, Bronson Curb, MSN, RNC-OB Diabetes Coordinator [phone] (8a-5p)

## 2020-12-04 NOTE — Progress Notes (Signed)
eLink Physician-Brief Progress Note Patient Name: [name] DOB: [birth year] MRN: [id number]   Date of Service  12/04/2020  HPI/Events of Note  Glucose low  eICU Interventions  Changed LR to D5LR     Intervention Category Intermediate Interventions: Diagnostic test evaluation  Tilden Dome 12/04/2020, 5:19 AM

## 2020-12-04 NOTE — Progress Notes (Signed)
[facility] Progress Note Patient Name: [name] DOB: [birth year] MRN: [id number]   Date of Service  12/04/2020  HPI/Events of Note  KCl 2.9  eICU Interventions  Ordered IV and PO replacement     Intervention Category Minor Interventions: Electrolytes abnormality - evaluation and management  Tilden Dome 12/04/2020, 1:48 AM

## 2020-12-04 NOTE — Progress Notes (Signed)
Subjective: Review of HPI:  [name] is a 30 y.o. female with a PMHx including uncontrolled DM1 who presented to the [facility] ED early on Monday with seizure activity. She had been complaining of back pain 2 days previously, was acting abnormally and then had multiple seizures at home prompting husband's decision to drive her to the ED. She was actively seizing when she was pulled out of the car her husband used to drive her to the hospital. Seizure activity then stopped, but she was noted to be postictal and combative. Per EDP, she appeared to be agitated and delirious after witnessed seizure. ED staff attempted to calm the patient with multiple rounds of Ativan, but she was still combative and not able to be still after 6 mg.  She was intubated and sedated with propofol. BP was 200s/130s continuously despite propofol titration, but later stabilized with medical management.    Objective: Current vital signs: BP 114/75   Pulse 64   Temp 98.2 F (36.8 C)   Resp 15   Ht _0  (1.702 m)   Wt 70.4 kg   SpO2 100%   BMI 24.31 kg/m  Vital signs in last 24 hours: Temp:  [95.2 F (35.1 C)-99.1 F (37.3 C)] 98.2 F (36.8 C) (09/06 0830) Pulse Rate:  [63-104] 64 (09/06 0830) Resp:  [0-17] 15 (09/06 0830) BP: (110-179)/(73-117) 114/75 (09/06 0830) SpO2:  [100 %] 100 % (09/06 0830) FiO2 (%):  [40 %-100 %] 40 % (09/06 0800) Weight:  [70.4 kg] 70.4 kg (09/05 2000)  Intake/Output from previous day: 09/05 0701 - 09/06 0700 In: 4621.6 [I.V.:2755.1; IV Piggyback:1866.5] Out: 700 [Urine:700] Intake/Output this shift: Total I/O In: 165.2 [I.V.:134.9; IV Piggyback:30.2] Out: 15 [Urine:15] Nutritional status:  Diet Order             Diet NPO time specified  Diet effective now                   Neurologic Exam: On IV sedation with propofol at a rate of 60 Ment: Deep sedation. No responses to external stimuli.  CN: Pupils 3 mm and unreactive on propofol gtt. Face flaccidly symmetric Motor:  Flaccid tone x 4. No movement to tactile stimuli.  Reflexes: Hypoactive in the context of sedation   Lab Results: Results for orders placed or performed during the hospital encounter of 12/03/20 (from the past 48 hour(s))  CBG monitoring, ED     Status: Abnormal   Collection Time: 12/03/20  4:58 AM  Result Value Ref Range   Glucose-Capillary 255 (H) 70 - 99 mg/dL    Comment: Glucose reference range applies only to samples taken after fasting for at least 8 hours.  CBC with Differential     Status: Abnormal   Collection Time: 12/03/20  5:16 AM  Result Value Ref Range   WBC 10.1 4.0 - 10.5 K/uL   RBC 3.74 (L) 3.87 - 5.11 MIL/uL   Hemoglobin 10.9 (L) 12.0 - 15.0 g/dL   HCT 34.4 (L) 36.0 - 46.0 %   MCV 92.0 80.0 - 100.0 fL   MCH 29.1 26.0 - 34.0 pg   MCHC 31.7 30.0 - 36.0 g/dL   RDW 14.5 11.5 - 15.5 %   Platelets 309 150 - 400 K/uL   nRBC 0.0 0.0 - 0.2 %   Neutrophils Relative % 74 %   Neutro Abs 7.4 1.7 - 7.7 K/uL   Lymphocytes Relative 19 %   Lymphs Abs 1.9 0.7 - 4.0 K/uL  Monocytes Relative 6 %   Monocytes Absolute 0.7 0.1 - 1.0 K/uL   Eosinophils Relative 0 %   Eosinophils Absolute 0.0 0.0 - 0.5 K/uL   Basophils Relative 1 %   Basophils Absolute 0.1 0.0 - 0.1 K/uL   Immature Granulocytes 0 %   Abs Immature Granulocytes 0.04 0.00 - 0.07 K/uL    Comment: Performed at [facility], [place] [address], [place], [place] [address]  Comprehensive metabolic panel     Status: Abnormal   Collection Time: 12/03/20  5:16 AM  Result Value Ref Range   Sodium 142 135 - 145 mmol/L   Potassium 3.2 (L) 3.5 - 5.1 mmol/L   Chloride 106 98 - 111 mmol/L   CO2 18 (L) 22 - 32 mmol/L   Glucose, Bld 314 (H) 70 - 99 mg/dL    Comment: Glucose reference range applies only to samples taken after fasting for at least 8 hours.   BUN 23 (H) 6 - 20 mg/dL   Creatinine, Ser 1.45 (H) 0.44 - 1.00 mg/dL   Calcium 8.8 (L) 8.9 - 10.3 mg/dL   Total Protein 7.4 6.5 - 8.1 g/dL   Albumin 2.5 (L)  3.5 - 5.0 g/dL   AST 27 15 - 41 U/L   ALT 11 0 - 44 U/L   Alkaline Phosphatase 155 (H) 38 - 126 U/L   Total Bilirubin 1.1 0.3 - 1.2 mg/dL   GFR, Estimated 49 (L) >60 mL/min    Comment: (NOTE) Calculated using the CKD-EPI Creatinine Equation (2021)    Anion gap 18 (H) 5 - 15    Comment: Performed at [facility], [place] [address] [place], [place] [address]  Troponin I (High Sensitivity)     Status: None   Collection Time: 12/03/20  5:16 AM  Result Value Ref Range   Troponin I (High Sensitivity) 14 <18 ng/L    Comment: (NOTE) Elevated high sensitivity troponin I (hsTnI) values and significant  changes across serial measurements may suggest ACS but many other  chronic and acute conditions are known to elevate hsTnI results.  Refer to the "Links" section for chest pain algorithms and additional  guidance. Performed at [facility], [place] [address], [place], [place] [address]   CK     Status: None   Collection Time: 12/03/20  5:16 AM  Result Value Ref Range   Total CK 93 38 - 234 U/L    Comment: Performed at [facility], [place] [address] [place], [place] [address]  I-Stat Beta hCG blood, ED ([facility], [facility], [facility] only)     Status: None   Collection Time: 12/03/20  5:19 AM  Result Value Ref Range   I-stat hCG, quantitative <5.0 <5 mIU/mL   Comment 3            Comment:   GEST. AGE      CONC.  (mIU/mL)   <=1 WEEK        5 - 50     2 WEEKS       50 - 500     3 WEEKS       100 - 10,000     4 WEEKS     1,000 - 30,000        FEMALE AND NON-PREGNANT FEMALE:     LESS THAN 5 mIU/mL   Ethanol     Status: None   Collection Time: 12/03/20  5:19 AM  Result Value Ref Range  Alcohol, Ethyl (B) <10 <10 mg/dL    Comment: (NOTE) Lowest detectable limit for serum alcohol is 10 mg/dL.  For medical purposes only. Performed at [facility], [place] [address] [place], [place] [address]   Acetaminophen level     Status: Abnormal    Collection Time: 12/03/20  5:19 AM  Result Value Ref Range   Acetaminophen (Tylenol), Serum <10 (L) 10 - 30 ug/mL    Comment: (NOTE) Therapeutic concentrations vary significantly. A range of 10-30 ug/mL  may be an effective concentration for many patients. However, some  are best treated at concentrations outside of this range. Acetaminophen concentrations >150 ug/mL at 4 hours after ingestion  and >50 ug/mL at 12 hours after ingestion are often associated with  toxic reactions.  Performed at [facility], [place] [address], [place], [place] [address]   Salicylate level     Status: Abnormal   Collection Time: 12/03/20  5:19 AM  Result Value Ref Range   Salicylate Lvl <5.1 (L) 7.0 - 30.0 mg/dL    Comment: Performed at [facility], [place] [address] [place], [place] [address]  Lactic acid, plasma     Status: None   Collection Time: 12/03/20  6:30 AM  Result Value Ref Range   Lactic Acid, Venous 1.0 0.5 - 1.9 mmol/L    Comment: Performed at [facility], [place] [address], [place], [place] [address]  Blood gas, arterial     Status: Abnormal   Collection Time: 12/03/20  6:37 AM  Result Value Ref Range   FIO2 100.00    Mode PRESSURE REGULATED VOLUME CONTROL    VT 500 mL   LHR 16.0 resp/min   Peep/cpap 5.0 cm H20   pH, Arterial 7.439 7.350 - 7.450   pCO2 arterial 29.6 (L) 32.0 - 48.0 mmHg   pO2, Arterial 497 (H) 83.0 - 108.0 mmHg   Bicarbonate 19.8 (L) 20.0 - 28.0 mmol/L   Acid-base deficit 2.8 (H) 0.0 - 2.0 mmol/L   O2 Saturation 100.0 %   Patient temperature 98.1    Collection site LEFT BRACHIAL    Drawn by [id number]     Comment: Performed at [facility], [place] [address] [place],  [address]  Rapid urine drug screen (hospital performed)     Status: Abnormal   Collection Time: 12/03/20  6:41 AM  Result Value Ref Range   Opiates NONE DETECTED NONE DETECTED   Cocaine NONE DETECTED NONE DETECTED    Benzodiazepines NONE DETECTED NONE DETECTED   Amphetamines NONE DETECTED NONE DETECTED   Tetrahydrocannabinol POSITIVE (A) NONE DETECTED   Barbiturates NONE DETECTED NONE DETECTED    Comment: (NOTE) DRUG SCREEN FOR MEDICAL PURPOSES ONLY.  IF CONFIRMATION IS NEEDED FOR ANY PURPOSE, NOTIFY LAB WITHIN 5 DAYS.  LOWEST DETECTABLE LIMITS FOR URINE DRUG SCREEN Drug Class                     Cutoff (ng/mL) Amphetamine and metabolites    1000 Barbiturate and metabolites    200 Benzodiazepine                 143 Tricyclics and metabolites     300 Opiates and metabolites        300 Cocaine and metabolites        300 THC  50 Performed at [facility], [place] [address] [place], [place] [address]   CBG monitoring, ED     Status: Abnormal   Collection Time: 12/03/20  8:26 AM  Result Value Ref Range   Glucose-Capillary 270 (H) 70 - 99 mg/dL    Comment: Glucose reference range applies only to samples taken after fasting for at least 8 hours.  Protime-INR     Status: Abnormal   Collection Time: 12/03/20  8:47 AM  Result Value Ref Range   Prothrombin Time 15.7 (H) 11.4 - 15.2 seconds   INR 1.3 (H) 0.8 - 1.2    Comment: (NOTE) INR goal varies based on device and disease states. Performed at [facility], [place] [address], [place], [place] [address]   Troponin I (High Sensitivity)     Status: None   Collection Time: 12/03/20  8:47 AM  Result Value Ref Range   Troponin I (High Sensitivity) 17 <18 ng/L    Comment: (NOTE) Elevated high sensitivity troponin I (hsTnI) values and significant  changes across serial measurements may suggest ACS but many other  chronic and acute conditions are known to elevate hsTnI results.  Refer to the "Links" section for chest pain algorithms and additional  guidance. Performed at [facility], [place] [address] [place], [place] [address]   CBC     Status: Abnormal   Collection  Time: 12/03/20  8:47 AM  Result Value Ref Range   WBC 7.4 4.0 - 10.5 K/uL   RBC 3.13 (L) 3.87 - 5.11 MIL/uL   Hemoglobin 9.2 (L) 12.0 - 15.0 g/dL   HCT 28.2 (L) 36.0 - 46.0 %   MCV 90.1 80.0 - 100.0 fL   MCH 29.4 26.0 - 34.0 pg   MCHC 32.6 30.0 - 36.0 g/dL   RDW 14.3 11.5 - 15.5 %   Platelets 196 150 - 400 K/uL   nRBC 0.0 0.0 - 0.2 %    Comment: Performed at [facility], [place] [address], [place], [place] [address]  Basic metabolic panel     Status: Abnormal   Collection Time: 12/03/20  8:47 AM  Result Value Ref Range   Sodium 140 135 - 145 mmol/L   Potassium 2.8 (L) 3.5 - 5.1 mmol/L   Chloride 109 98 - 111 mmol/L   CO2 18 (L) 22 - 32 mmol/L   Glucose, Bld 323 (H) 70 - 99 mg/dL    Comment: Glucose reference range applies only to samples taken after fasting for at least 8 hours.   BUN 25 (H) 6 - 20 mg/dL   Creatinine, Ser 1.56 (H) 0.44 - 1.00 mg/dL   Calcium 8.0 (L) 8.9 - 10.3 mg/dL   GFR, Estimated 45 (L) >60 mL/min    Comment: (NOTE) Calculated using the CKD-EPI Creatinine Equation (2021)    Anion gap 13 5 - 15    Comment: Performed at [facility], [place] [address], [place], [place] [address]  Beta-hydroxybutyric acid     Status: Abnormal   Collection Time: 12/03/20  8:47 AM  Result Value Ref Range   Beta-Hydroxybutyric Acid 5.77 (H) 0.05 - 0.27 mmol/L    Comment: RESULTS CONFIRMED BY MANUAL DILUTION Performed at [place] [address] [place],  [address]   CBG monitoring, ED     Status: Abnormal   Collection Time: 12/03/20  9:27 AM  Result Value Ref Range   Glucose-Capillary 285 (H) 70 - 99 mg/dL  Comment: Glucose reference range applies only to samples taken after fasting for at least 8 hours.  CBG monitoring, ED     Status: Abnormal   Collection Time: 12/03/20 10:38 AM  Result Value Ref Range   Glucose-Capillary 221 (H) 70 - 99 mg/dL    Comment: Glucose reference range applies only to samples  taken after fasting for at least 8 hours.  CBG monitoring, ED     Status: Abnormal   Collection Time: 12/03/20 11:35 AM  Result Value Ref Range   Glucose-Capillary 185 (H) 70 - 99 mg/dL    Comment: Glucose reference range applies only to samples taken after fasting for at least 8 hours.  Glucose, capillary     Status: Abnormal   Collection Time: 12/03/20  1:03 PM  Result Value Ref Range   Glucose-Capillary 128 (H) 70 - 99 mg/dL    Comment: Glucose reference range applies only to samples taken after fasting for at least 8 hours.  Basic metabolic panel     Status: Abnormal   Collection Time: 12/03/20  1:46 PM  Result Value Ref Range   Sodium 139 135 - 145 mmol/L   Potassium 2.9 (L) 3.5 - 5.1 mmol/L   Chloride 105 98 - 111 mmol/L   CO2 23 22 - 32 mmol/L   Glucose, Bld 111 (H) 70 - 99 mg/dL    Comment: Glucose reference range applies only to samples taken after fasting for at least 8 hours.   BUN 21 (H) 6 - 20 mg/dL   Creatinine, Ser 1.58 (H) 0.44 - 1.00 mg/dL   Calcium 7.9 (L) 8.9 - 10.3 mg/dL   GFR, Estimated 45 (L) >60 mL/min    Comment: (NOTE) Calculated using the CKD-EPI Creatinine Equation (2021)    Anion gap 11 5 - 15    Comment: Performed at [place] [address], [place], [place] [address]  Beta-hydroxybutyric acid     Status: Abnormal   Collection Time: 12/03/20  1:56 PM  Result Value Ref Range   Beta-Hydroxybutyric Acid 0.64 (H) 0.05 - 0.27 mmol/L    Comment: Performed at [place] [address], [place], [place] [address]  MRSA Next Gen by PCR, Nasal     Status: None   Collection Time: 12/03/20  1:56 PM   Specimen: Nasal Mucosa; Nasal Swab  Result Value Ref Range   MRSA by PCR Next Gen NOT DETECTED NOT DETECTED    Comment: (NOTE) The GeneXpert MRSA Assay (FDA approved for NASAL specimens only), is one component of a comprehensive MRSA colonization surveillance program. It is not intended to diagnose MRSA infection nor to guide or monitor  treatment for MRSA infections. Test performance is not FDA approved in patients less than 69 years old. Performed at [facility], [place] [address], [place], [place] [address]   Glucose, capillary     Status: Abnormal   Collection Time: 12/03/20  2:15 PM  Result Value Ref Range   Glucose-Capillary 105 (H) 70 - 99 mg/dL    Comment: Glucose reference range applies only to samples taken after fasting for at least 8 hours.  Glucose, capillary     Status: Abnormal   Collection Time: 12/03/20  3:02 PM  Result Value Ref Range   Glucose-Capillary 107 (H) 70 - 99 mg/dL    Comment: Glucose reference range applies only to samples taken after fasting for at least 8 hours.  Glucose, capillary     Status: None  Collection Time: 12/03/20  4:31 PM  Result Value Ref Range   Glucose-Capillary 82 70 - 99 mg/dL    Comment: Glucose reference range applies only to samples taken after fasting for at least 8 hours.  Glucose, capillary     Status: None   Collection Time: 12/03/20  5:47 PM  Result Value Ref Range   Glucose-Capillary 93 70 - 99 mg/dL    Comment: Glucose reference range applies only to samples taken after fasting for at least 8 hours.  Glucose, capillary     Status: Abnormal   Collection Time: 12/03/20  6:54 PM  Result Value Ref Range   Glucose-Capillary 107 (H) 70 - 99 mg/dL    Comment: Glucose reference range applies only to samples taken after fasting for at least 8 hours.  Glucose, capillary     Status: Abnormal   Collection Time: 12/03/20  8:08 PM  Result Value Ref Range   Glucose-Capillary 119 (H) 70 - 99 mg/dL    Comment: Glucose reference range applies only to samples taken after fasting for at least 8 hours.  Glucose, capillary     Status: Abnormal   Collection Time: 12/03/20  9:17 PM  Result Value Ref Range   Glucose-Capillary 135 (H) 70 - 99 mg/dL    Comment: Glucose reference range applies only to samples taken after fasting for at least 8 hours.  Basic metabolic panel      Status: Abnormal   Collection Time: 12/03/20  9:30 PM  Result Value Ref Range   Sodium 139 135 - 145 mmol/L   Potassium 3.0 (L) 3.5 - 5.1 mmol/L   Chloride 110 98 - 111 mmol/L   CO2 24 22 - 32 mmol/L   Glucose, Bld 124 (H) 70 - 99 mg/dL    Comment: Glucose reference range applies only to samples taken after fasting for at least 8 hours.   BUN 22 (H) 6 - 20 mg/dL   Creatinine, Ser 1.46 (H) 0.44 - 1.00 mg/dL   Calcium 7.5 (L) 8.9 - 10.3 mg/dL   GFR, Estimated 49 (L) >60 mL/min    Comment: (NOTE) Calculated using the CKD-EPI Creatinine Equation (2021)    Anion gap 5 5 - 15    Comment: Performed at [place] [address] [place], [place] [address]  Glucose, capillary     Status: Abnormal   Collection Time: 12/03/20 10:23 PM  Result Value Ref Range   Glucose-Capillary 112 (H) 70 - 99 mg/dL    Comment: Glucose reference range applies only to samples taken after fasting for at least 8 hours.  Glucose, capillary     Status: Abnormal   Collection Time: 12/03/20 11:48 PM  Result Value Ref Range   Glucose-Capillary 68 (L) 70 - 99 mg/dL    Comment: Glucose reference range applies only to samples taken after fasting for at least 8 hours.  Basic metabolic panel     Status: Abnormal   Collection Time: 12/04/20 12:21 AM  Result Value Ref Range   Sodium 138 135 - 145 mmol/L   Potassium 2.9 (L) 3.5 - 5.1 mmol/L   Chloride 109 98 - 111 mmol/L   CO2 24 22 - 32 mmol/L   Glucose, Bld 103 (H) 70 - 99 mg/dL    Comment: Glucose reference range applies only to samples taken after fasting for at least 8 hours.   BUN 23 (H) 6 - 20 mg/dL   Creatinine, Ser 1.54 (H) 0.44 - 1.00 mg/dL  Calcium 7.4 (L) 8.9 - 10.3 mg/dL   GFR, Estimated 46 (L) >60 mL/min    Comment: (NOTE) Calculated using the CKD-EPI Creatinine Equation (2021)    Anion gap 5 5 - 15    Comment: Performed at [facility], [place] [address] [place], [place] [address]  Beta-hydroxybutyric acid     Status: None    Collection Time: 12/04/20 12:21 AM  Result Value Ref Range   Beta-Hydroxybutyric Acid 0.05 0.05 - 0.27 mmol/L    Comment: Performed at [place] [address], [place],  [address]  Glucose, capillary     Status: None   Collection Time: 12/04/20 12:35 AM  Result Value Ref Range   Glucose-Capillary 87 70 - 99 mg/dL    Comment: Glucose reference range applies only to samples taken after fasting for at least 8 hours.  Glucose, capillary     Status: Abnormal   Collection Time: 12/04/20  2:37 AM  Result Value Ref Range   Glucose-Capillary 35 (LL) 70 - 99 mg/dL    Comment: Glucose reference range applies only to samples taken after fasting for at least 8 hours.   Comment 1 Notify RN   Glucose, capillary     Status: None   Collection Time: 12/04/20  3:01 AM  Result Value Ref Range   Glucose-Capillary 99 70 - 99 mg/dL    Comment: Glucose reference range applies only to samples taken after fasting for at least 8 hours.  Glucose, capillary     Status: Abnormal   Collection Time: 12/04/20  3:45 AM  Result Value Ref Range   Glucose-Capillary 53 (L) 70 - 99 mg/dL    Comment: Glucose reference range applies only to samples taken after fasting for at least 8 hours.  Glucose, capillary     Status: Abnormal   Collection Time: 12/04/20  4:12 AM  Result Value Ref Range   Glucose-Capillary 111 (H) 70 - 99 mg/dL    Comment: Glucose reference range applies only to samples taken after fasting for at least 8 hours.  Basic metabolic panel     Status: Abnormal   Collection Time: 12/04/20  4:40 AM  Result Value Ref Range   Sodium 138 135 - 145 mmol/L   Potassium 3.2 (L) 3.5 - 5.1 mmol/L   Chloride 109 98 - 111 mmol/L   CO2 24 22 - 32 mmol/L   Glucose, Bld 90 70 - 99 mg/dL    Comment: Glucose reference range applies only to samples taken after fasting for at least 8 hours.   BUN 23 (H) 6 - 20 mg/dL   Creatinine, Ser 1.74 (H) 0.44 - 1.00 mg/dL   Calcium 7.4 (L) 8.9 - 10.3 mg/dL   GFR,  Estimated 40 (L) >60 mL/min    Comment: (NOTE) Calculated using the CKD-EPI Creatinine Equation (2021)    Anion gap 5 5 - 15    Comment: Performed at [place] [address], [place], [place] [address]  CBC     Status: Abnormal   Collection Time: 12/04/20  4:40 AM  Result Value Ref Range   WBC 7.7 4.0 - 10.5 K/uL   RBC 2.61 (L) 3.87 - 5.11 MIL/uL   Hemoglobin 7.5 (L) 12.0 - 15.0 g/dL   HCT 23.6 (L) 36.0 - 46.0 %   MCV 90.4 80.0 - 100.0 fL   MCH 28.7 26.0 - 34.0 pg   MCHC 31.8 30.0 - 36.0 g/dL   RDW 14.7 11.5 -  15.5 %   Platelets 168 150 - 400 K/uL   nRBC 0.0 0.0 - 0.2 %    Comment: Performed at [facility], [place] [address], [place], [place] [address]  Glucose, capillary     Status: None   Collection Time: 12/04/20  5:00 AM  Result Value Ref Range   Glucose-Capillary 72 70 - 99 mg/dL    Comment: Glucose reference range applies only to samples taken after fasting for at least 8 hours.  Glucose, capillary     Status: Abnormal   Collection Time: 12/04/20  6:08 AM  Result Value Ref Range   Glucose-Capillary 34 (LL) 70 - 99 mg/dL    Comment: Glucose reference range applies only to samples taken after fasting for at least 8 hours.   Comment 1 Notify RN   Glucose, capillary     Status: Abnormal   Collection Time: 12/04/20  6:27 AM  Result Value Ref Range   Glucose-Capillary 104 (H) 70 - 99 mg/dL    Comment: Glucose reference range applies only to samples taken after fasting for at least 8 hours.  Glucose, capillary     Status: Abnormal   Collection Time: 12/04/20  7:41 AM  Result Value Ref Range   Glucose-Capillary 55 (L) 70 - 99 mg/dL    Comment: Glucose reference range applies only to samples taken after fasting for at least 8 hours.  Glucose, capillary     Status: Abnormal   Collection Time: 12/04/20  8:15 AM  Result Value Ref Range   Glucose-Capillary 117 (H) 70 - 99 mg/dL    Comment: Glucose reference range applies only to samples taken after fasting for at  least 8 hours.    Recent Results (from the past 240 hour(s))  MRSA Next Gen by PCR, Nasal     Status: None   Collection Time: 12/03/20  1:56 PM   Specimen: Nasal Mucosa; Nasal Swab  Result Value Ref Range Status   MRSA by PCR Next Gen NOT DETECTED NOT DETECTED Final    Comment: (NOTE) The GeneXpert MRSA Assay (FDA approved for NASAL specimens only), is one component of a comprehensive MRSA colonization surveillance program. It is not intended to diagnose MRSA infection nor to guide or monitor treatment for MRSA infections. Test performance is not FDA approved in patients less than 31 years old. Performed at [facility], [place] [address], [place], [place] [address]     Lipid Panel No results for input(s): CHOL, TRIG, HDL, CHOLHDL, VLDL, LDLCALC in the last 72 hours.  Studies/Results: CT ABDOMEN PELVIS WO CONTRAST  Result Date: 12/03/2020 CLINICAL DATA:  Seizure, pulled from car, postictal, confusion. Tachycardia. Aortic disease, nontraumatic; Abdominal pain, acute, nonlocalized EXAM: CT CHEST, ABDOMEN AND PELVIS WITHOUT CONTRAST TECHNIQUE: Multidetector CT imaging of the chest, abdomen and pelvis was performed following the standard protocol without IV contrast. COMPARISON:  11/12/2020 unenhanced CT abdomen/pelvis. 06/04/2020 chest CT angiogram. FINDINGS: CT CHEST FINDINGS Cardiovascular: Top-normal heart size. No significant pericardial effusion/thickening. Right PICC terminates at the cavoatrial junction. Top-normal caliber main pulmonary artery (3.4 cm diameter). Mediastinum/Nodes: No discrete thyroid nodules. Unremarkable esophagus. Enteric tube terminates in the proximal stomach. No pathologically enlarged axillary, mediastinal or hilar lymph nodes, noting limited sensitivity for the detection of hilar adenopathy on this noncontrast study. Lungs/Pleura: No pneumothorax. No pleural effusion. Endotracheal tube tip is 2.2 cm above the carina. Mild to moderate patchy consolidation and  ground-glass opacity throughout the dependent lower lobes bilaterally. Mild patchy ground-glass opacity in  the left upper lobe. No lung masses or significant pulmonary nodules. Musculoskeletal: No aggressive appearing focal osseous lesions. Nondisplaced healing subacute anterior left fourth rib fracture. No acute fractures. Stable sclerotic T6 bone island. Moderate anasarca. CT ABDOMEN PELVIS FINDINGS Hepatobiliary: Normal liver with no liver mass. Cholecystectomy. No biliary ductal dilatation. Pancreas: Normal, with no mass or duct dilation. Spleen: Normal size. No mass. Adrenals/Urinary Tract: Normal adrenals. No renal stones. No hydronephrosis. No contour deforming renal masses. Normal bladder. Stomach/Bowel: Normal non-distended stomach. Normal caliber small bowel with no small bowel wall thickening. Appendix not discretely visualized. Collapsed large bowel with no diverticulosis, definite large bowel wall thickening or pericolonic fat stranding. Vascular/Lymphatic: Normal caliber abdominal aorta. No pathologically enlarged lymph nodes in the abdomen or pelvis. Reproductive: Grossly normal uterus.  No adnexal mass. Other: No pneumoperitoneum. Small volume ascites. No focal fluid collections. Moderate anasarca. Musculoskeletal: No aggressive appearing focal osseous lesions. IMPRESSION: 1. Moderate anasarca. Small volume ascites. 2. Mild to moderate patchy consolidation and ground-glass opacity throughout the dependent lower lobes bilaterally. Mild patchy ground-glass opacity in the left upper lobe. Suspect a combination of aspiration and atelectasis. 3. Well positioned endotracheal and enteric tubes. 4. Nondisplaced healing subacute anterior left fourth rib fracture. No acute traumatic injury in the chest, abdomen and pelvis on this noncontrast scan. Electronically Signed   By: Ilona Sorrel M.D.   On: 12/03/2020 06:28   CT HEAD WO CONTRAST (5MM)  Result Date: 12/03/2020 CLINICAL DATA:  Seizure EXAM: CT HEAD  WITHOUT CONTRAST TECHNIQUE: Contiguous axial images were obtained from the base of the skull through the vertex without intravenous contrast. COMPARISON:  09/25/2020 FINDINGS: Brain: No evidence of acute infarction, hemorrhage, hydrocephalus, extra-axial collection or mass lesion/mass effect. Vascular: No hyperdense vessel or unexpected calcification. Skull: Normal. Negative for fracture or focal lesion. Sinuses/Orbits: No acute finding. IMPRESSION: Stable and negative head CT. Electronically Signed   By: Monte Fantasia M.D.   On: 12/03/2020 06:17   CT CHEST WO CONTRAST  Result Date: 12/03/2020 CLINICAL DATA:  Seizure, pulled from car, postictal, confusion. Tachycardia. Aortic disease, nontraumatic; Abdominal pain, acute, nonlocalized EXAM: CT CHEST, ABDOMEN AND PELVIS WITHOUT CONTRAST TECHNIQUE: Multidetector CT imaging of the chest, abdomen and pelvis was performed following the standard protocol without IV contrast. COMPARISON:  11/12/2020 unenhanced CT abdomen/pelvis. 06/04/2020 chest CT angiogram. FINDINGS: CT CHEST FINDINGS Cardiovascular: Top-normal heart size. No significant pericardial effusion/thickening. Right PICC terminates at the cavoatrial junction. Top-normal caliber main pulmonary artery (3.4 cm diameter). Mediastinum/Nodes: No discrete thyroid nodules. Unremarkable esophagus. Enteric tube terminates in the proximal stomach. No pathologically enlarged axillary, mediastinal or hilar lymph nodes, noting limited sensitivity for the detection of hilar adenopathy on this noncontrast study. Lungs/Pleura: No pneumothorax. No pleural effusion. Endotracheal tube tip is 2.2 cm above the carina. Mild to moderate patchy consolidation and ground-glass opacity throughout the dependent lower lobes bilaterally. Mild patchy ground-glass opacity in the left upper lobe. No lung masses or significant pulmonary nodules. Musculoskeletal: No aggressive appearing focal osseous lesions. Nondisplaced healing subacute  anterior left fourth rib fracture. No acute fractures. Stable sclerotic T6 bone island. Moderate anasarca. CT ABDOMEN PELVIS FINDINGS Hepatobiliary: Normal liver with no liver mass. Cholecystectomy. No biliary ductal dilatation. Pancreas: Normal, with no mass or duct dilation. Spleen: Normal size. No mass. Adrenals/Urinary Tract: Normal adrenals. No renal stones. No hydronephrosis. No contour deforming renal masses. Normal bladder. Stomach/Bowel: Normal non-distended stomach. Normal caliber small bowel with no small bowel wall thickening. Appendix not discretely visualized. Collapsed large bowel with no  diverticulosis, definite large bowel wall thickening or pericolonic fat stranding. Vascular/Lymphatic: Normal caliber abdominal aorta. No pathologically enlarged lymph nodes in the abdomen or pelvis. Reproductive: Grossly normal uterus.  No adnexal mass. Other: No pneumoperitoneum. Small volume ascites. No focal fluid collections. Moderate anasarca. Musculoskeletal: No aggressive appearing focal osseous lesions. IMPRESSION: 1. Moderate anasarca. Small volume ascites. 2. Mild to moderate patchy consolidation and ground-glass opacity throughout the dependent lower lobes bilaterally. Mild patchy ground-glass opacity in the left upper lobe. Suspect a combination of aspiration and atelectasis. 3. Well positioned endotracheal and enteric tubes. 4. Nondisplaced healing subacute anterior left fourth rib fracture. No acute traumatic injury in the chest, abdomen and pelvis on this noncontrast scan. Electronically Signed   By: Ilona Sorrel M.D.   On: 12/03/2020 06:28   MR BRAIN WO CONTRAST  Result Date: 12/03/2020 CLINICAL DATA:  Seizure, abnormal neuro exam. EXAM: MRI HEAD WITHOUT CONTRAST TECHNIQUE: Multiplanar, multiecho pulse sequences of the brain and surrounding structures were obtained without intravenous contrast. COMPARISON:  Prior head CT examinations 12/03/2020 and earlier. FINDINGS: Brain: Cerebral volume is  normal for age. There is fairly symmetric patchy cortical/subcortical T2/FLAIR hyperintense signal abnormality within the bilateral frontal lobes, parietooccipital lobes and posterior temporal lobes. Additionally, there are patchy foci of T2/FLAIR hyperintense signal abnormality within the posterior limbs of internal capsules and bilateral basal ganglia bilaterally. 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 Alternative etiologies (including cerebritis) can not be excluded. The hippocampi are symmetric in size and signal. There is no acute infarct. No evidence of an intracranial mass. No chronic intracranial blood products. No extra-axial fluid collection. No midline shift. Vascular: Maintained flow voids within the proximal large arterial vessels. Skull and upper cervical spine: No focal suspicious marrow lesion. Sinuses/Orbits: Visualized orbits show no acute finding. Trace bilateral ethmoid sinus mucosal thickening. Other: Posterior scalp edema bilaterally. These results will be called to the ordering clinician or representative by the Radiologist Assistant, and communication documented in the PACS or [employer]. IMPRESSION: Fairly symmetric patchy cortical/subcortical T2/FLAIR hyperintense signal abnormality within the bilateral frontal lobes, parietooccipital lobes and posterior temporal lobes. Additional patchy foci of T2/FLAIR hyperintense signal abnormality within the posterior limbs of the internal capsules and bilateral basal ganglia bilaterally. 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 Alternative etiologies (including cerebritis) can not be excluded. Clinical correlation is recommended. Additionally, a follow-up brain MRI is recommended to assess for stability, resolution or progression of these signal changes. Electronically Signed   By: Kellie Simmering D.O.   On: 12/03/2020  16:31   DG Chest Port 1 View  Result Date: 12/03/2020 CLINICAL DATA:  Intubated, seizure EXAM: PORTABLE CHEST 1 VIEW COMPARISON:  11/19/2020 chest radiograph. FINDINGS: Endotracheal tube tip is 4.3 cm above the carina. Enteric tube enters stomach with the tip not seen on this image. Right PICC terminates at the cavoatrial junction. Stable cardiomediastinal silhouette with top-normal heart size. No pneumothorax. No pleural effusion. Mild hazy patchy bibasilar lung opacities. No pulmonary edema. IMPRESSION: 1. Well-positioned support structures. 2. Mild hazy patchy bibasilar lung opacities, suspect atelectasis or aspiration. Electronically Signed   By: Ilona Sorrel M.D.   On: 12/03/2020 06:30   EEG adult  Result Date: 12/03/2020 Lora Havens, MD     12/03/2020  5:56 PM Patient Name: HAISLEY ARENS MRN: [id number] Epilepsy Attending: Lora Havens Referring Physician/Provider: Dr Kerney Elbe Date: 12/03/2020 Duration: 22.05 mins Patient history: 30yo F with ams. EEG  to evaluate for seizure. Level of alertness: comatose AEDs during EEG study: GBP, LEV, propofol, Ativan Technical aspects: This EEG study was done with scalp electrodes positioned according to the 10-20 International system of electrode placement. Electrical activity was acquired at a sampling rate of _0  and reviewed with a high frequency filter of _1  and a low frequency filter of _2 . EEG data were recorded continuously and digitally stored. Description:  EEG showed continuous generalized 2-_3  delta slowing admixed with an excessive amount of 15 to 18 Hz beta activity with irregular morphology distributed symmetrically and diffusely. Hyperventilation and photic stimulation were not performed.    ABNORMALITY - Continuous slow, generalized - Excessive beta, generalized  IMPRESSION: This study is suggestive of severe diffuse encephalopathy, nonspecific etiology but likely related to sedation. No seizures or epileptiform discharges were seen  throughout the recording.  Dr Cheral Marker was notified.  Priyanka Barbra Sarks   Overnight EEG with video  Result Date: 12/04/2020 Lora Havens, MD     12/04/2020  8:48 AM Patient Name: SHAWNTELL DIXSON MRN: [id number] Epilepsy Attending: Lora Havens Referring Physician/Provider: Dr Kerney Elbe Duration: 12/03/2020 1751 to 12/04/2020 0845  Patient history: 30yo F with ams. EEG to evaluate for seizure.  Level of alertness: comatose  AEDs during EEG study: GBP, LEV, propofol  Technical aspects: This EEG study was done with scalp electrodes positioned according to the 10-20 International system of electrode placement. Electrical activity was acquired at a sampling rate of _4  and reviewed with a high frequency filter of _5  and a low frequency filter of _6 . EEG data were recorded continuously and digitally stored.  Description:  EEG showed continuous generalized 2-_7  delta slowing admixed with an excessive amount of 15 to 18 Hz beta activity with irregular morphology distributed symmetrically and diffusely. Hyperventilation and photic stimulation were not performed.    ABNORMALITY - Continuous slow, generalized - Excessive beta, generalized  IMPRESSION: This study is suggestive of severe diffuse encephalopathy, nonspecific etiology but likely related to sedation. No seizures or epileptiform discharges were seen throughout the recording.  Priyanka Barbra Sarks     Medications: Scheduled:  amLODipine  10 mg Per Tube Daily   carvedilol  12.5 mg Per Tube BID WC   chlorhexidine gluconate (MEDLINE KIT)  15 mL Mouth Rinse BID   Chlorhexidine Gluconate Cloth  6 each Topical Daily   gabapentin  200 mg Per Tube TID   heparin  5,000 Units Subcutaneous Q8H   hydrALAZINE  100 mg Per Tube Q12H   insulin aspart  0-15 Units Subcutaneous Q4H   insulin detemir  0.3 Units/kg Subcutaneous Q24H   LORazepam  0.5 mg Per Tube BID   mouth rinse  15 mL Mouth Rinse 10 times per day   metoCLOPramide  5 mg Per Tube TID AC   pantoprazole  sodium  40 mg Per Tube Daily   sodium chloride flush  10-40 mL Intracatheter Q12H   Continuous:  dextrose 5% lactated ringers with KCl 20 mEq/L 100 mL/hr at 12/04/20 0800   levETIRAcetam 400 mL/hr at 12/04/20 0800   propofol (DIPRIVAN) infusion 70 mcg/kg/min (12/04/20 0807)   thiamine injection Stopped (12/03/20 2229)    LTM EEG: ABNORMALITY - Continuous slow, generalized - Excessive beta, generalized  IMPRESSION: This study is suggestive of severe diffuse encephalopathy, nonspecific etiology but likely related to sedation. No seizures or epileptiform discharges were seen throughout the recording.    Assessment: 30 year old female presenting with new onset of GTC seizure activity, preceded  by AMS. Markedly elevated BP on arrival to the ED.  1. Exam in the ED yesterday revealed an encephalopathic patient with no clinical seizure activity following Keppra load. Exam today on propofol with no clinical seizure activity,. otherwise uninformative due to sedation.  2. Hypertensive on presentation. Now resolved.   3. Overall presentation in conjunction with MRI findings most consistent with PRES.   4. Received 1500 mg IV Keppra load in the ED. Started on scheduled Keppra at 500 mg BID 5. EEG with continuous generalized slowing and excessive beta activity. The study is suggestive of severe diffuse encephalopathy, nonspecific etiology but likely related to sedation. No seizures or epileptiform discharges were seen throughout the recording 6. MRI brain reveals fairly symmetric patchy cortical/subcortical T2/FLAIR hyperintense signal abnormality within the bilateral frontal lobes, parietooccipital lobes and posterior temporal lobes. Additional patchy foci of T2/FLAIR hyperintense signal abnormality within the posterior limbs of the internal capsules and bilateral basal ganglia bilaterally. 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  Alternative etiologies (including cerebritis) can not be excluded. Clinical correlation is recommended. Additionally, a follow-up brain MRI is recommended to assess for stability, resolution or progression of these signal changes.     Recommendations: 1. Continue LTM 2. Continue Keppra scheduled dosing at 500 mg q12h.  3. IV high dose thiamine 4. Inpatient seizure precautions 5. Frequent neuro checks.  6. Continue BP management with SBP goal of < 160.  7. Will need repeat MRI brain in 7 days to assess for possible improvement.  8. When she is back to baseline, will need to discuss outpatient seizure precautions with the patient.  9. Wean off propofol over next 3 hours then call Neurology for repeat exam  35 minutes of critical care time     LOS: 1 day   _0  signed: Dr. Kerney Elbe 12/04/2020  9:03 AM

## 2020-12-04 NOTE — Procedures (Addendum)
Patient Name: [name]  MRN: [id number]  Epilepsy Attending: Lora Havens  Referring Physician/Provider: Dr Kerney Elbe Duration: 12/03/2020 1751 to 12/04/2020 1751   Patient history: 30yo F with ams. EEG to evaluate for seizure.    Level of alertness: comatose   AEDs during EEG study: GBP, LEV, propofol   Technical aspects: This EEG study was done with scalp electrodes positioned according to the 10-20 International system of electrode placement. Electrical activity was acquired at a sampling rate of 500Hz  and reviewed with a high frequency filter of 70Hz  and a low frequency filter of 1Hz . EEG data were recorded continuously and digitally stored.    Description:  EEG showed continuous generalized 2-3Hz  delta slowing admixed with an excessive amount of 15 to 18 Hz beta activity with irregular morphology distributed symmetrically and diffusely. Hyperventilation and photic stimulation were not performed.      ABNORMALITY - Continuous slow, generalized - Excessive beta, generalized   IMPRESSION: This study is suggestive of severe diffuse encephalopathy, nonspecific etiology but likely related to sedation. No seizures or epileptiform discharges were seen throughout the recording.   [name] Barbra Sarks

## 2020-12-04 NOTE — Progress Notes (Signed)
Hypoglycemic Event  CBG: 68  Treatment: D50 25 mL (12.5 gm)  Symptoms: None  Follow-up CBG: Time:0035 CBG Result:87  Possible Reasons for Event: Other: NPO  Comments/MD notified:[facility]link physician notified    [name] A [name]

## 2020-12-05 ENCOUNTER — Inpatient Hospital Stay (HOSPITAL_COMMUNITY): Payer: Medicaid Other

## 2020-12-05 DIAGNOSIS — I161 Hypertensive emergency: Secondary | ICD-10-CM | POA: Diagnosis not present

## 2020-12-05 DIAGNOSIS — E440 Moderate protein-calorie malnutrition: Secondary | ICD-10-CM | POA: Diagnosis not present

## 2020-12-05 DIAGNOSIS — R569 Unspecified convulsions: Secondary | ICD-10-CM | POA: Diagnosis not present

## 2020-12-05 DIAGNOSIS — I6783 Posterior reversible encephalopathy syndrome: Secondary | ICD-10-CM | POA: Diagnosis not present

## 2020-12-05 DIAGNOSIS — R410 Disorientation, unspecified: Secondary | ICD-10-CM | POA: Diagnosis not present

## 2020-12-05 LAB — TRIGLYCERIDES: Triglycerides: 49 mg/dL (ref <150)

## 2020-12-05 LAB — GLUCOSE, CAPILLARY
Glucose-Capillary: 123 mg/dL — ABNORMAL HIGH (ref 70–99)
Glucose-Capillary: 165 mg/dL — ABNORMAL HIGH (ref 70–99)
Glucose-Capillary: 202 mg/dL — ABNORMAL HIGH (ref 70–99)
Glucose-Capillary: 241 mg/dL — ABNORMAL HIGH (ref 70–99)
Glucose-Capillary: 44 mg/dL — CL (ref 70–99)
Glucose-Capillary: 75 mg/dL (ref 70–99)
Glucose-Capillary: 76 mg/dL (ref 70–99)
Glucose-Capillary: 78 mg/dL (ref 70–99)

## 2020-12-05 LAB — COMPREHENSIVE METABOLIC PANEL
ALT: 8 U/L (ref 0–44)
AST: 13 U/L — ABNORMAL LOW (ref 15–41)
Albumin: 1.5 g/dL — ABNORMAL LOW (ref 3.5–5.0)
Alkaline Phosphatase: 105 U/L (ref 38–126)
Anion gap: 4 — ABNORMAL LOW (ref 5–15)
BUN: 22 mg/dL — ABNORMAL HIGH (ref 6–20)
CO2: 24 mmol/L (ref 22–32)
Calcium: 7.5 mg/dL — ABNORMAL LOW (ref 8.9–10.3)
Chloride: 109 mmol/L (ref 98–111)
Creatinine, Ser: 2.14 mg/dL — ABNORMAL HIGH (ref 0.44–1.00)
GFR, Estimated: 31 mL/min — ABNORMAL LOW (ref >60)
Glucose, Bld: 72 mg/dL (ref 70–99)
Potassium: 4.3 mmol/L (ref 3.5–5.1)
Sodium: 137 mmol/L (ref 135–145)
Total Bilirubin: 0.6 mg/dL (ref 0.3–1.2)
Total Protein: 5.2 g/dL — ABNORMAL LOW (ref 6.5–8.1)

## 2020-12-05 LAB — MAGNESIUM: Magnesium: 2.1 mg/dL (ref 1.7–2.4)

## 2020-12-05 LAB — CBC
HCT: 25 % — ABNORMAL LOW (ref 36.0–46.0)
Hemoglobin: 7.8 g/dL — ABNORMAL LOW (ref 12.0–15.0)
MCH: 29 pg (ref 26.0–34.0)
MCHC: 31.2 g/dL (ref 30.0–36.0)
MCV: 92.9 fL (ref 80.0–100.0)
Platelets: 168 10*3/uL (ref 150–400)
RBC: 2.69 MIL/uL — ABNORMAL LOW (ref 3.87–5.11)
RDW: 15.5 % (ref 11.5–15.5)
WBC: 7.3 10*3/uL (ref 4.0–10.5)
nRBC: 0 % (ref 0.0–0.2)

## 2020-12-05 LAB — VITAMIN B1: Vitamin B1 (Thiamine): 98.2 nmol/L (ref 66.5–200.0)

## 2020-12-05 LAB — PHOSPHORUS: Phosphorus: 3.2 mg/dL (ref 2.5–4.6)

## 2020-12-05 IMAGING — US US RENAL
1 series · 14 of 25 positions shown · non-contrast
Comparison: Noncontrast CT [DATE].  Renal ultrasound [DATE]

CLINICAL DATA: Acute kidney injury.

EXAM:
RENAL / URINARY TRACT ULTRASOUND COMPLETE

[Series 1: us renal · 14 of 35 slices shown]
[im 1/35]
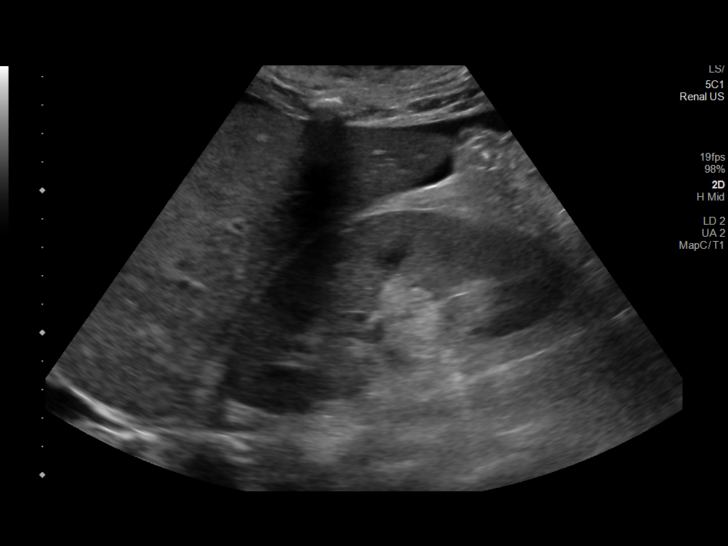
[im 3/35]
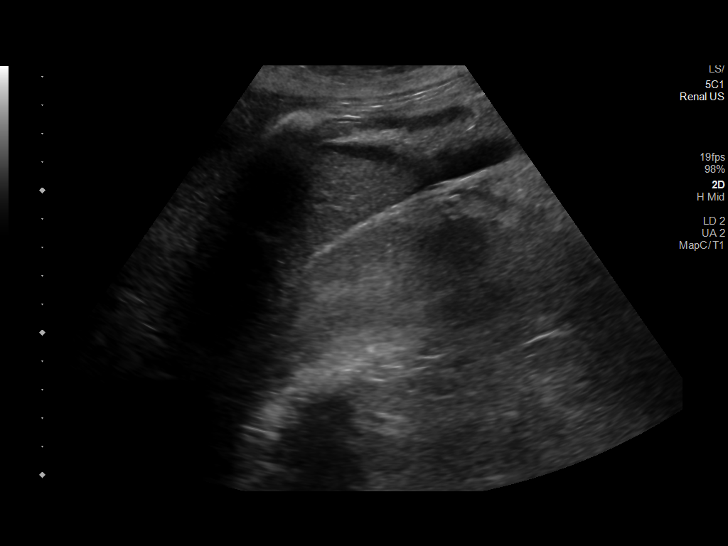
[im 6/35]
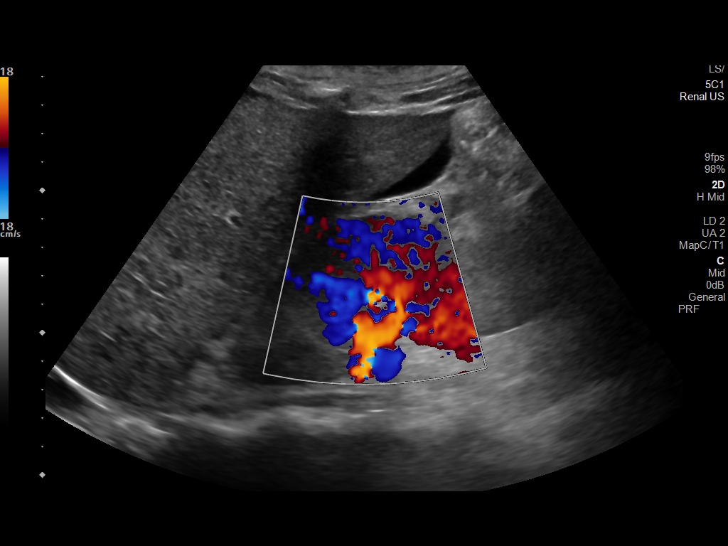
[im 9/35]
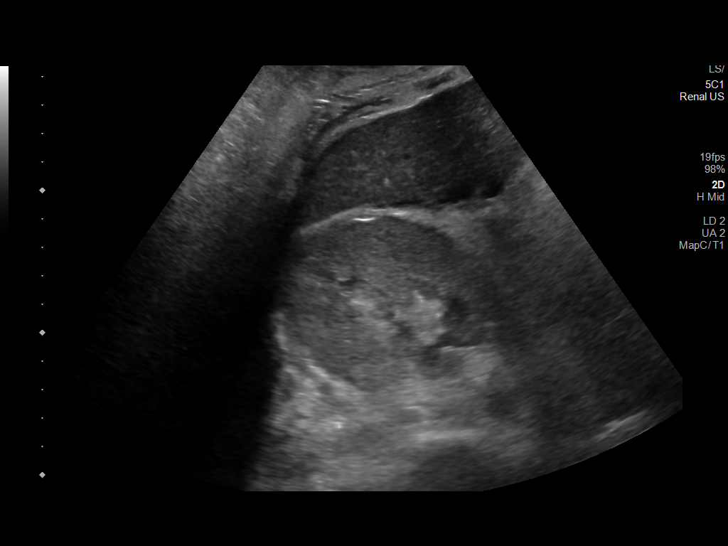
[im 12/35]
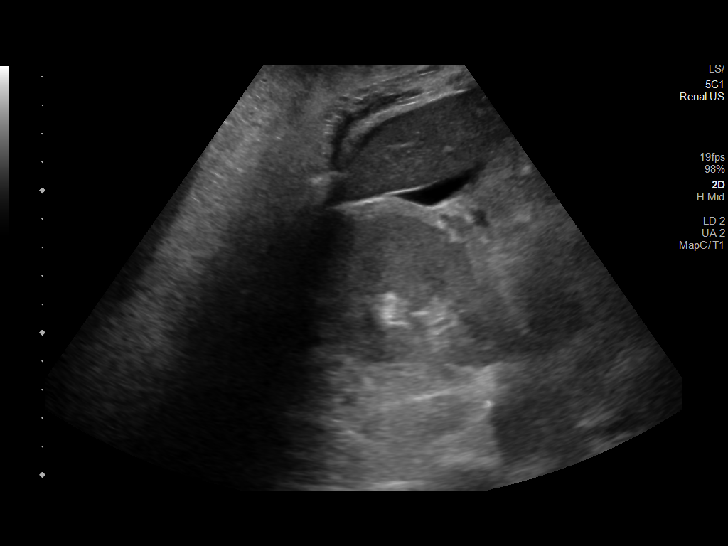
[im 13/35]
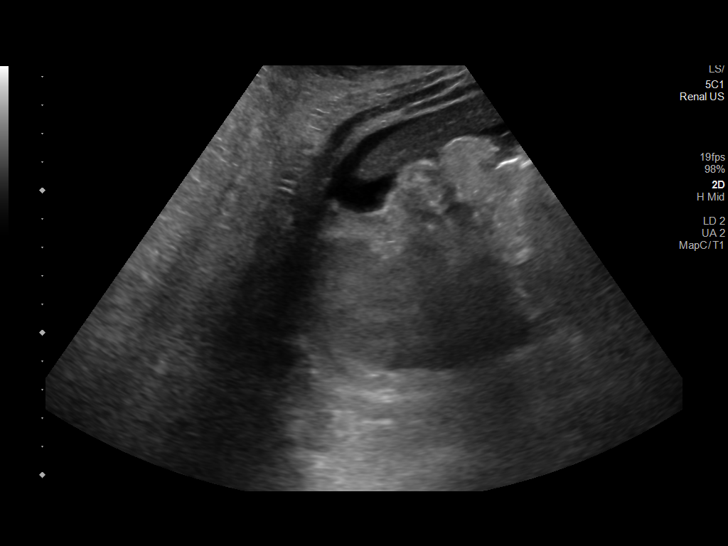
[im 16/35]
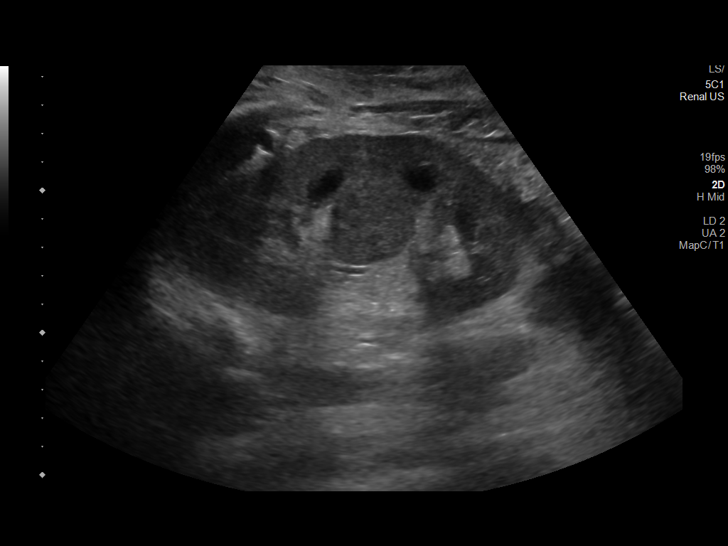
[im 19/35]
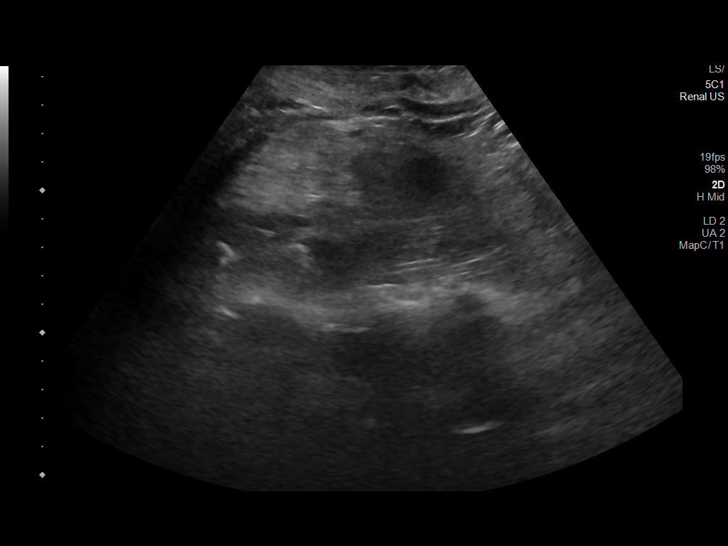
[im 22/35]
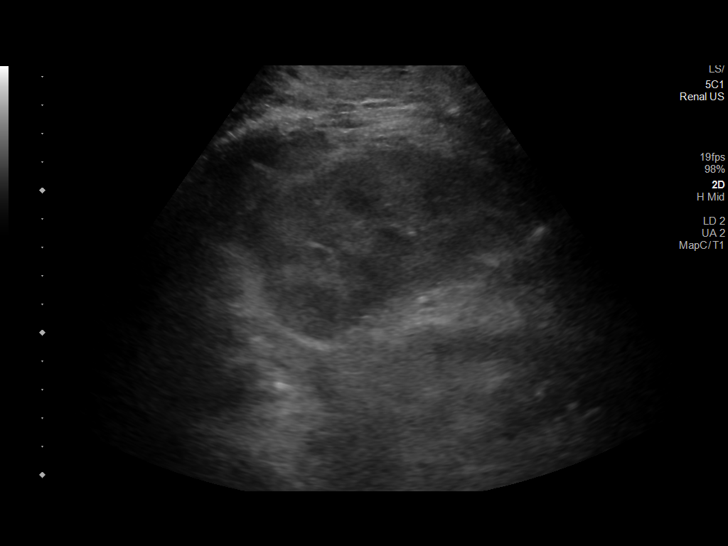
[im 23/35]
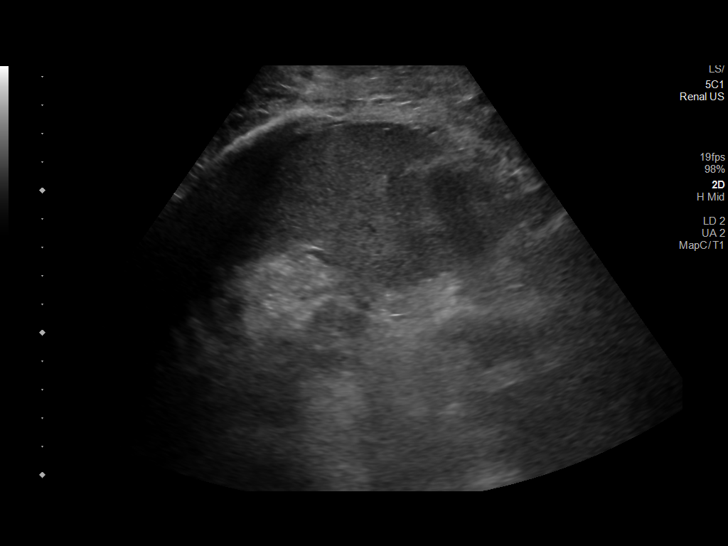
[im 26/35]
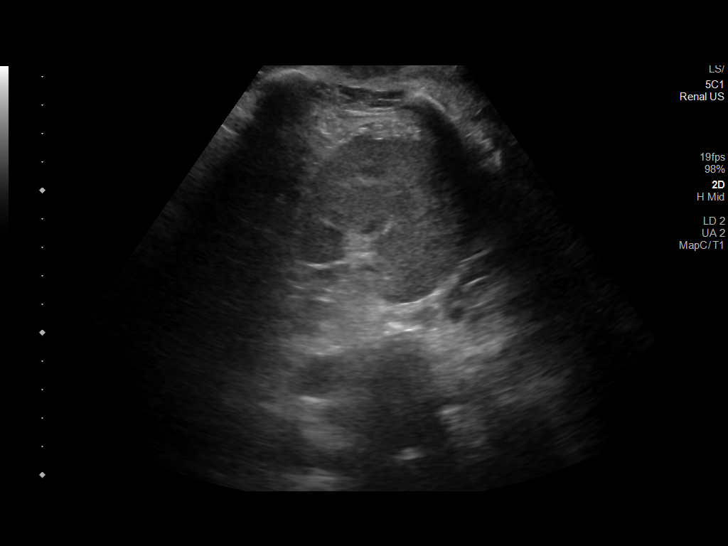
[im 29/35]
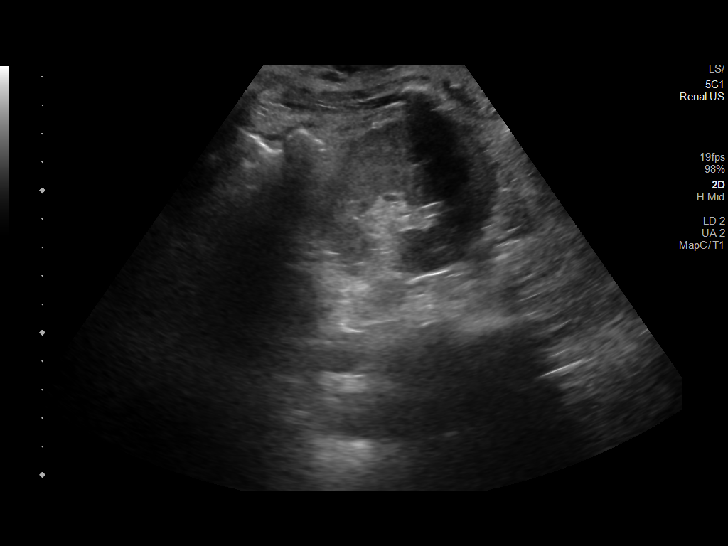
[im 32/35]
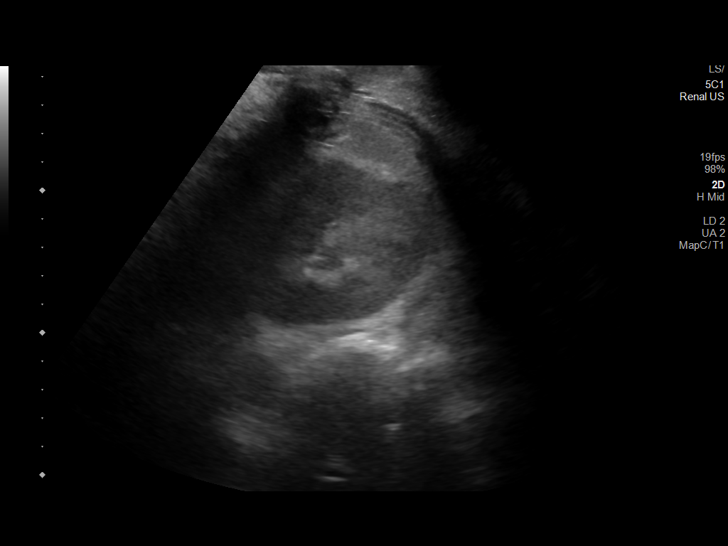
[im 35/35]
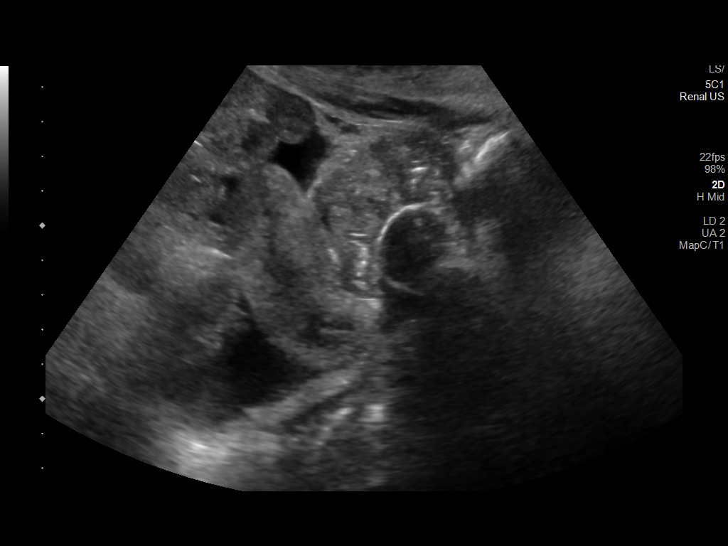

[14 of 25 positions shown; findings below may reference images not displayed]

FINDINGS: Right Kidney:

Renal measurements: 12.2 x 6.0 x 5.5 cm = volume: 207 mL. Diffusely
increased renal parenchymal echogenicity. No hydronephrosis. No
evidence of focal lesion or stone.

Left Kidney:

Renal measurements: 12.7 x 5.2 x 4.6 cm = volume: 158 mL. Diffusely
increased renal parenchymal echogenicity. No hydronephrosis. No
evidence of focal lesion or stone.

Bladder:

Decompressed by Foley catheter. Bladder wall may be thickened
despite nondistention.

Other:

Small volume abdominopelvic ascites, as seen on recent CT.
IMPRESSION: 1. No hydronephrosis or obstructive uropathy.
2. Mild increased renal parenchymal echogenicity consistent with
chronic medical renal disease.
3. Question bladder wall thickening versus nondistention.
4. Small volume ascites as seen on recent CT.

## 2020-12-05 MED ORDER — GABAPENTIN 100 MG PO CAPS
200.0000 mg | ORAL_CAPSULE | Freq: Three times a day (TID) | ORAL | Status: DC
  Administered 2020-12-05 – 2020-12-10 (×16): 200 mg via ORAL
  Filled 2020-12-05 (×16): qty 2

## 2020-12-05 MED ORDER — PROSOURCE PLUS PO LIQD
30.0000 mL | Freq: Two times a day (BID) | ORAL | Status: DC
  Administered 2020-12-05 – 2020-12-10 (×12): 30 mL via ORAL
  Filled 2020-12-05 (×12): qty 30

## 2020-12-05 MED ORDER — ACETAMINOPHEN 325 MG PO TABS
650.0000 mg | ORAL_TABLET | Freq: Four times a day (QID) | ORAL | Status: DC | PRN
  Administered 2020-12-05: 650 mg via ORAL
  Filled 2020-12-05: qty 2

## 2020-12-05 MED ORDER — DEXTROSE 50 % IV SOLN
25.0000 g | INTRAVENOUS | Status: AC
  Administered 2020-12-05: 25 g via INTRAVENOUS
  Filled 2020-12-05: qty 50

## 2020-12-05 MED ORDER — METOCLOPRAMIDE HCL 5 MG PO TABS
5.0000 mg | ORAL_TABLET | Freq: Three times a day (TID) | ORAL | Status: DC
  Administered 2020-12-05 – 2020-12-10 (×16): 5 mg via ORAL
  Filled 2020-12-05 (×18): qty 1

## 2020-12-05 MED ORDER — LORAZEPAM 0.5 MG PO TABS
0.5000 mg | ORAL_TABLET | Freq: Two times a day (BID) | ORAL | Status: DC
  Administered 2020-12-05 – 2020-12-10 (×10): 0.5 mg via ORAL
  Filled 2020-12-05 (×10): qty 1

## 2020-12-05 MED ORDER — AMLODIPINE BESYLATE 10 MG PO TABS
10.0000 mg | ORAL_TABLET | Freq: Every day | ORAL | Status: DC
  Administered 2020-12-06 – 2020-12-10 (×5): 10 mg via ORAL
  Filled 2020-12-05 (×5): qty 1

## 2020-12-05 MED ORDER — BOOST / RESOURCE BREEZE PO LIQD CUSTOM
1.0000 | Freq: Two times a day (BID) | ORAL | Status: DC
  Administered 2020-12-05 – 2020-12-09 (×10): 1 via ORAL

## 2020-12-05 MED ORDER — CARVEDILOL 12.5 MG PO TABS
12.5000 mg | ORAL_TABLET | Freq: Two times a day (BID) | ORAL | Status: DC
  Administered 2020-12-05 – 2020-12-10 (×11): 12.5 mg via ORAL
  Filled 2020-12-05 (×11): qty 1

## 2020-12-05 NOTE — Procedures (Signed)
Extubation Procedure Note  Patient Details:   Name: [name] DOB: [birth year] MRN: [id number]   Airway Documentation:    Vent end date: 12/05/20 Vent end time: 0947   Evaluation  O2 sats: stable throughout Complications: No apparent complications Patient did tolerate procedure well. Bilateral Breath Sounds: Clear, Diminished   Patient extubated per MD order & placed on 4L [place].Patient had good cuff leak prior.Patient able to speak & cough post extubation.Currently breathing well in no distress.  [name] 12/05/2020, 9:48 AM

## 2020-12-05 NOTE — Progress Notes (Signed)
Nutrition Follow-up  DOCUMENTATION CODES:   Non-severe (moderate) malnutrition in context of chronic illness  INTERVENTION:   - Boost Breeze po BID, each supplement provides 250 kcal and 9 grams of protein  - ProSource Plus 30 ml po BID, each supplement provides 100 kcal and 15 grams of protein  - Encourage PO intake  NUTRITION DIAGNOSIS:   Moderate Malnutrition related to chronic illness (gastroparesis, chronic nausea/vomiting, uncontrolled T1DM) as evidenced by mild fat depletion, moderate muscle depletion.  Ongoing, being addressed via oral nutrition supplements and diet advancement  GOAL:   Patient will meet greater than or equal to 90% of their needs  Progressing  MONITOR:   PO intake, Supplement acceptance, Weight trends, I & O's, Labs, Skin  REASON FOR ASSESSMENT:   Ventilator, Consult Enteral/tube feeding initiation and management (trickle tube feeds)  ASSESSMENT:   30 year old female who presented to the ED on 9/05 with seizures, N/V. PMH of T1DM, anxiety, depression, GERD, gastroparesis, chronic N/V, BKA, HTN. Pt admitted with concern for seizure activity and concern for DKA. Pt required intubation.  9/06 - trickle TF initiated 9/07 - extubated, diet advanced to regular  Discussed pt with RN and during ICU rounds. Pt with PRES syndrome, now improved. Pt also with non-oliguric AKI which is worsening per CCM.  Pt extubated today and diet advanced to regular. RD to order clear liquid oral nutrition supplements as pt does not like/does not tolerate milky supplements like Ensure or Glucerna.  RD will monitor for the ability to provide gastroparesis diet education.  Admit weight: 70.4 kg Current weight: 74.1 kg  Pt with non-pitting edema to BUE.  Medications reviewed and include: SSI q 4 hours, reglan 5 mg TID before meals, protonix, keppra, IV thiamine IVF: D10 @ 50 ml/hr  Labs reviewed: BUN 22, creatinine 2.14, hemoglobin 7.8, HCT 25.0 CBG's: 44-123 x 24  hours  UOP: 650 ml x 24 hours I/O's: +5.1 L since admit  Diet Order:   Diet Order             Diet regular Room service appropriate? Yes with Assist; Fluid consistency: Thin  Diet effective now                   EDUCATION NEEDS:   Not appropriate for education at this time  Skin:  Skin Assessment: Skin Integrity Issues: Incisions: L great toe  Last BM:  12/05/20 large type 7  Height:   Ht Readings from Last 1 Encounters:  12/03/20 5' 7"  (1.702 m)    Weight:   Wt Readings from Last 1 Encounters:  12/05/20 74.1 kg    BMI:  Body mass index is 25.59 kg/m.  Estimated Nutritional Needs:   Kcal:  1800-2000  Protein:  85-105 grams  Fluid:  >/= 1.8 L    Gustavus Bryant, MS, RD, LDN Inpatient Clinical Dietitian Please see [facility] for contact information.

## 2020-12-05 NOTE — Progress Notes (Signed)
Inpatient Diabetes Program Recommendations  AACE/ADA: New Consensus Statement on Inpatient Glycemic Control (2015)  Target Ranges:  Prepandial:   less than 140 mg/dL      Peak postprandial:   less than 180 mg/dL (1-2 hours)      Critically ill patients:  140 - 180 mg/dL   Lab Results  Component Value Date   GLUCAP 78 12/05/2020   HGBA1C 7.2 (H) 11/13/2020    Review of Glycemic Control Results for [name], [name] (MRN [id number]) as of 12/05/2020 08:58  Ref. Range 12/05/2020 03:36 12/05/2020 06:13 12/05/2020 06:35 12/05/2020 07:16  Glucose-Capillary Latest Ref Range: 70 - 99 mg/dL 76 44 (LL) 123 (H) 78   Diabetes history: Type 1 DM Outpatient Diabetes medications: Lantus 8 units QHS, Humalog 0-5 units TID Current orders for Inpatient glycemic control: Levemir 5 units QD, Novolog 0-6 units Q4H Vital @ 20 ml/hr D10% @ 66m/hr  Inpatient Diabetes Program Recommendations:     Consider discontinuing Levemir 5 units QD. Once glucose trends exceed 160 mg/dL could consider adding back. Assuming hypo this AM related to increased creatinine?   Thanks, LBronson Curb MSN, RNC-OB Diabetes Coordinator [phone])

## 2020-12-05 NOTE — Progress Notes (Signed)
Subjective: Review of HPI:  [name] is a 30 y.o. female with a PMHx including uncontrolled DM1 who presented to the [facility] ED early on Monday with seizure activity. She had been complaining of back pain 2 days previously, was acting abnormally and then had multiple seizures at home prompting husband's decision to drive her to the ED. She was actively seizing when she was pulled out of the car her husband used to drive her to the hospital. Seizure activity then stopped, but she was noted to be postictal and combative. Per EDP, she appeared to be agitated and delirious after witnessed seizure. ED staff attempted to calm the patient with multiple rounds of Ativan, but she was still combative and not able to be still after 6 mg.  She was intubated and sedated with propofol. BP was 200s/130s continuously despite propofol titration, but later stabilized with medical management.    Objective: Current vital signs: BP (!) 152/95   Pulse 75   Temp 98.6 F (37 C)   Resp 16   Ht 5' 7"  (1.702 m)   Wt 74.1 kg   SpO2 100%   BMI 25.59 kg/m  Vital signs in last 24 hours: Temp:  [97 F (36.1 C)-99.3 F (37.4 C)] 98.6 F (37 C) (09/07 0700) Pulse Rate:  [63-75] 75 (09/07 0700) Resp:  [0-21] 16 (09/07 0700) BP: (85-153)/(58-106) 152/95 (09/07 0700) SpO2:  [93 %-100 %] 100 % (09/07 0700) FiO2 (%):  [40 %] 40 % (09/07 0445) Weight:  [74.1 kg] 74.1 kg (09/07 0500)  Intake/Output from previous day: 09/06 0701 - 09/07 0700 In: 2956.5 [I.V.:2001.8; NG/GT:505; IV Piggyback:449.8] Out: 650 [Urine:650] Intake/Output this shift: No intake/output data recorded. Nutritional status:  Diet Order             Diet NPO time specified  Diet effective now                  HEENT: [place]/AT Lungs: Intubated Ext: Right BKA  Neurologic Exam: Ment: Awake with eyes open and will follow commands. Nods head and mouths one word answers to simple questions. Mouths "two" when 2 fingers held up and asked to count  them. Holds up 5 fingers when examiner holds up 5 and asks her to count them.  CN: EOMI with end-gaze nystagmus noted. No ptosis. Face grossly symmetric. Intubated.  Motor: 4+/5 x 4.  Sensory: Intact to FT x 4  Lab Results: Results for orders placed or performed during the hospital encounter of 12/03/20 (from the past 48 hour(s))  CBG monitoring, ED     Status: Abnormal   Collection Time: 12/03/20  8:26 AM  Result Value Ref Range   Glucose-Capillary 270 (H) 70 - 99 mg/dL    Comment: Glucose reference range applies only to samples taken after fasting for at least 8 hours.  Protime-INR     Status: Abnormal   Collection Time: 12/03/20  8:47 AM  Result Value Ref Range   Prothrombin Time 15.7 (H) 11.4 - 15.2 seconds   INR 1.3 (H) 0.8 - 1.2    Comment: (NOTE) INR goal varies based on device and disease states. Performed at [facility], [place] [address] [place], [place] [address]   Troponin I (High Sensitivity)     Status: None   Collection Time: 12/03/20  8:47 AM  Result Value Ref Range   Troponin I (High Sensitivity) 17 <18 ng/L    Comment: (NOTE) Elevated high sensitivity troponin I (hsTnI) values and significant  changes across serial measurements may suggest ACS but many other  chronic and acute conditions are known to elevate hsTnI results.  Refer to the "Links" section for chest pain algorithms and additional  guidance. Performed at [facility], [place] [address] [place], [place] [address]   CBC     Status: Abnormal   Collection Time: 12/03/20  8:47 AM  Result Value Ref Range   WBC 7.4 4.0 - 10.5 K/uL   RBC 3.13 (L) 3.87 - 5.11 MIL/uL   Hemoglobin 9.2 (L) 12.0 - 15.0 g/dL   HCT 28.2 (L) 36.0 - 46.0 %   MCV 90.1 80.0 - 100.0 fL   MCH 29.4 26.0 - 34.0 pg   MCHC 32.6 30.0 - 36.0 g/dL   RDW 14.3 11.5 - 15.5 %   Platelets 196 150 - 400 K/uL   nRBC 0.0 0.0 - 0.2 %    Comment: Performed at [facility], [place] [address], [place], [place] [address]  Basic metabolic panel     Status: Abnormal   Collection Time: 12/03/20  8:47 AM  Result Value Ref Range   Sodium 140 135 - 145 mmol/L   Potassium 2.8 (L) 3.5 - 5.1 mmol/L   Chloride 109 98 - 111 mmol/L   CO2 18 (L) 22 - 32 mmol/L   Glucose, Bld 323 (H) 70 - 99 mg/dL    Comment: Glucose reference range applies only to samples taken after fasting for at least 8 hours.   BUN 25 (H) 6 - 20 mg/dL   Creatinine, Ser 1.56 (H) 0.44 - 1.00 mg/dL   Calcium 8.0 (L) 8.9 - 10.3 mg/dL   GFR, Estimated 45 (L) >60 mL/min    Comment: (NOTE) Calculated using the CKD-EPI Creatinine Equation (2021)    Anion gap 13 5 - 15    Comment: Performed at [facility], [place] [address] [place], [place] [address]  Beta-hydroxybutyric acid     Status: Abnormal   Collection Time: 12/03/20  8:47 AM  Result Value Ref Range   Beta-Hydroxybutyric Acid 5.77 (H) 0.05 - 0.27 mmol/L    Comment: RESULTS CONFIRMED BY MANUAL DILUTION Performed at [facility] [address] [place], [place] [address]   CBG monitoring, ED     Status: Abnormal   Collection Time: 12/03/20  9:27 AM  Result Value Ref Range   Glucose-Capillary 285 (H) 70 - 99 mg/dL    Comment: Glucose reference range applies only to samples taken after fasting for at least 8 hours.  CBG monitoring, ED     Status: Abnormal   Collection Time: 12/03/20 10:38 AM  Result Value Ref Range   Glucose-Capillary 221 (H) 70 - 99 mg/dL    Comment: Glucose reference range applies only to samples taken after fasting for at least 8 hours.  CBG monitoring, ED     Status: Abnormal   Collection Time: 12/03/20 11:35 AM  Result Value Ref Range   Glucose-Capillary 185 (H) 70 - 99 mg/dL    Comment: Glucose reference range applies only to samples taken after fasting for at least 8 hours.  Glucose, capillary     Status: Abnormal   Collection Time: 12/03/20  1:03 PM  Result Value Ref Range   Glucose-Capillary 128  (H) 70 - 99 mg/dL    Comment: Glucose reference range applies only to samples taken after fasting for at least 8 hours.  Basic metabolic panel     Status: Abnormal  Collection Time: 12/03/20  1:46 PM  Result Value Ref Range   Sodium 139 135 - 145 mmol/L   Potassium 2.9 (L) 3.5 - 5.1 mmol/L   Chloride 105 98 - 111 mmol/L   CO2 23 22 - 32 mmol/L   Glucose, Bld 111 (H) 70 - 99 mg/dL    Comment: Glucose reference range applies only to samples taken after fasting for at least 8 hours.   BUN 21 (H) 6 - 20 mg/dL   Creatinine, Ser 1.58 (H) 0.44 - 1.00 mg/dL   Calcium 7.9 (L) 8.9 - 10.3 mg/dL   GFR, Estimated 45 (L) >60 mL/min    Comment: (NOTE) Calculated using the CKD-EPI Creatinine Equation (2021)    Anion gap 11 5 - 15    Comment: Performed at [facility] [address], [place], [place] [address]  Beta-hydroxybutyric acid     Status: Abnormal   Collection Time: 12/03/20  1:56 PM  Result Value Ref Range   Beta-Hydroxybutyric Acid 0.64 (H) 0.05 - 0.27 mmol/L    Comment: Performed at [facility] [address], [place], [place] [address]  MRSA Next Gen by PCR, Nasal     Status: None   Collection Time: 12/03/20  1:56 PM   Specimen: Nasal Mucosa; Nasal Swab  Result Value Ref Range   MRSA by PCR Next Gen NOT DETECTED NOT DETECTED    Comment: (NOTE) The GeneXpert MRSA Assay (FDA approved for NASAL specimens only), is one component of a comprehensive MRSA colonization surveillance program. It is not intended to diagnose MRSA infection nor to guide or monitor treatment for MRSA infections. Test performance is not FDA approved in patients less than 45 years old. Performed at [facility], [place] [address], [place], [place] [address]   Glucose, capillary     Status: Abnormal   Collection Time: 12/03/20  2:15 PM  Result Value Ref Range   Glucose-Capillary 105 (H) 70 - 99 mg/dL    Comment: Glucose reference range applies only to samples taken after fasting for at least  8 hours.  Glucose, capillary     Status: Abnormal   Collection Time: 12/03/20  3:02 PM  Result Value Ref Range   Glucose-Capillary 107 (H) 70 - 99 mg/dL    Comment: Glucose reference range applies only to samples taken after fasting for at least 8 hours.  Glucose, capillary     Status: None   Collection Time: 12/03/20  4:31 PM  Result Value Ref Range   Glucose-Capillary 82 70 - 99 mg/dL    Comment: Glucose reference range applies only to samples taken after fasting for at least 8 hours.  Glucose, capillary     Status: None   Collection Time: 12/03/20  5:47 PM  Result Value Ref Range   Glucose-Capillary 93 70 - 99 mg/dL    Comment: Glucose reference range applies only to samples taken after fasting for at least 8 hours.  Glucose, capillary     Status: Abnormal   Collection Time: 12/03/20  6:54 PM  Result Value Ref Range   Glucose-Capillary 107 (H) 70 - 99 mg/dL    Comment: Glucose reference range applies only to samples taken after fasting for at least 8 hours.  Glucose, capillary     Status: Abnormal   Collection Time: 12/03/20  8:08 PM  Result Value Ref Range   Glucose-Capillary 119 (H) 70 - 99 mg/dL    Comment: Glucose reference range applies only to samples  taken after fasting for at least 8 hours.  Glucose, capillary     Status: Abnormal   Collection Time: 12/03/20  9:17 PM  Result Value Ref Range   Glucose-Capillary 135 (H) 70 - 99 mg/dL    Comment: Glucose reference range applies only to samples taken after fasting for at least 8 hours.  Basic metabolic panel     Status: Abnormal   Collection Time: 12/03/20  9:30 PM  Result Value Ref Range   Sodium 139 135 - 145 mmol/L   Potassium 3.0 (L) 3.5 - 5.1 mmol/L   Chloride 110 98 - 111 mmol/L   CO2 24 22 - 32 mmol/L   Glucose, Bld 124 (H) 70 - 99 mg/dL    Comment: Glucose reference range applies only to samples taken after fasting for at least 8 hours.   BUN 22 (H) 6 - 20 mg/dL   Creatinine, Ser 1.46 (H) 0.44 - 1.00 mg/dL    Calcium 7.5 (L) 8.9 - 10.3 mg/dL   GFR, Estimated 49 (L) >60 mL/min    Comment: (NOTE) Calculated using the CKD-EPI Creatinine Equation (2021)    Anion gap 5 5 - 15    Comment: Performed at [facility] [address] [place], [place] [address]  Glucose, capillary     Status: Abnormal   Collection Time: 12/03/20 10:23 PM  Result Value Ref Range   Glucose-Capillary 112 (H) 70 - 99 mg/dL    Comment: Glucose reference range applies only to samples taken after fasting for at least 8 hours.  Glucose, capillary     Status: Abnormal   Collection Time: 12/03/20 11:48 PM  Result Value Ref Range   Glucose-Capillary 68 (L) 70 - 99 mg/dL    Comment: Glucose reference range applies only to samples taken after fasting for at least 8 hours.  Basic metabolic panel     Status: Abnormal   Collection Time: 12/04/20 12:21 AM  Result Value Ref Range   Sodium 138 135 - 145 mmol/L   Potassium 2.9 (L) 3.5 - 5.1 mmol/L   Chloride 109 98 - 111 mmol/L   CO2 24 22 - 32 mmol/L   Glucose, Bld 103 (H) 70 - 99 mg/dL    Comment: Glucose reference range applies only to samples taken after fasting for at least 8 hours.   BUN 23 (H) 6 - 20 mg/dL   Creatinine, Ser 1.54 (H) 0.44 - 1.00 mg/dL   Calcium 7.4 (L) 8.9 - 10.3 mg/dL   GFR, Estimated 46 (L) >60 mL/min    Comment: (NOTE) Calculated using the CKD-EPI Creatinine Equation (2021)    Anion gap 5 5 - 15    Comment: Performed at [facility] [address] [place], [place] [address]  Beta-hydroxybutyric acid     Status: None   Collection Time: 12/04/20 12:21 AM  Result Value Ref Range   Beta-Hydroxybutyric Acid 0.05 0.05 - 0.27 mmol/L    Comment: Performed at [facility] [address], [place], [place] [address]  Glucose, capillary     Status: None   Collection Time: 12/04/20 12:35 AM  Result Value Ref Range   Glucose-Capillary 87 70 - 99 mg/dL    Comment: Glucose reference range applies only to samples taken after fasting for at  least 8 hours.  Glucose, capillary     Status: Abnormal   Collection Time: 12/04/20  2:37 AM  Result Value Ref Range   Glucose-Capillary 35 (LL) 70 - 99  mg/dL    Comment: Glucose reference range applies only to samples taken after fasting for at least 8 hours.   Comment 1 Notify RN   Glucose, capillary     Status: None   Collection Time: 12/04/20  3:01 AM  Result Value Ref Range   Glucose-Capillary 99 70 - 99 mg/dL    Comment: Glucose reference range applies only to samples taken after fasting for at least 8 hours.  Glucose, capillary     Status: Abnormal   Collection Time: 12/04/20  3:45 AM  Result Value Ref Range   Glucose-Capillary 53 (L) 70 - 99 mg/dL    Comment: Glucose reference range applies only to samples taken after fasting for at least 8 hours.  Glucose, capillary     Status: Abnormal   Collection Time: 12/04/20  4:12 AM  Result Value Ref Range   Glucose-Capillary 111 (H) 70 - 99 mg/dL    Comment: Glucose reference range applies only to samples taken after fasting for at least 8 hours.  Basic metabolic panel     Status: Abnormal   Collection Time: 12/04/20  4:40 AM  Result Value Ref Range   Sodium 138 135 - 145 mmol/L   Potassium 3.2 (L) 3.5 - 5.1 mmol/L   Chloride 109 98 - 111 mmol/L   CO2 24 22 - 32 mmol/L   Glucose, Bld 90 70 - 99 mg/dL    Comment: Glucose reference range applies only to samples taken after fasting for at least 8 hours.   BUN 23 (H) 6 - 20 mg/dL   Creatinine, Ser 1.74 (H) 0.44 - 1.00 mg/dL   Calcium 7.4 (L) 8.9 - 10.3 mg/dL   GFR, Estimated 40 (L) >60 mL/min    Comment: (NOTE) Calculated using the CKD-EPI Creatinine Equation (2021)    Anion gap 5 5 - 15    Comment: Performed at [facility] [address], [place], [place] [address]  CBC     Status: Abnormal   Collection Time: 12/04/20  4:40 AM  Result Value Ref Range   WBC 7.7 4.0 - 10.5 K/uL   RBC 2.61 (L) 3.87 - 5.11 MIL/uL   Hemoglobin 7.5 (L) 12.0 - 15.0 g/dL   HCT 23.6 (L) 36.0  - 46.0 %   MCV 90.4 80.0 - 100.0 fL   MCH 28.7 26.0 - 34.0 pg   MCHC 31.8 30.0 - 36.0 g/dL   RDW 14.7 11.5 - 15.5 %   Platelets 168 150 - 400 K/uL   nRBC 0.0 0.0 - 0.2 %    Comment: Performed at [facility], [place] [address], [place], [place] [address]  Magnesium     Status: Abnormal   Collection Time: 12/04/20  4:40 AM  Result Value Ref Range   Magnesium 1.3 (L) 1.7 - 2.4 mg/dL    Comment: Performed at [facility] [address] [place], [place] [address]  Glucose, capillary     Status: None   Collection Time: 12/04/20  5:00 AM  Result Value Ref Range   Glucose-Capillary 72 70 - 99 mg/dL    Comment: Glucose reference range applies only to samples taken after fasting for at least 8 hours.  Glucose, capillary     Status: Abnormal   Collection Time: 12/04/20  6:08 AM  Result Value Ref Range   Glucose-Capillary 34 (LL) 70 - 99 mg/dL    Comment: Glucose reference range applies only to samples taken after fasting for at least  8 hours.   Comment 1 Notify RN   Glucose, capillary     Status: Abnormal   Collection Time: 12/04/20  6:27 AM  Result Value Ref Range   Glucose-Capillary 104 (H) 70 - 99 mg/dL    Comment: Glucose reference range applies only to samples taken after fasting for at least 8 hours.  Glucose, capillary     Status: Abnormal   Collection Time: 12/04/20  7:41 AM  Result Value Ref Range   Glucose-Capillary 55 (L) 70 - 99 mg/dL    Comment: Glucose reference range applies only to samples taken after fasting for at least 8 hours.  Glucose, capillary     Status: Abnormal   Collection Time: 12/04/20  8:15 AM  Result Value Ref Range   Glucose-Capillary 117 (H) 70 - 99 mg/dL    Comment: Glucose reference range applies only to samples taken after fasting for at least 8 hours.  Glucose, capillary     Status: None   Collection Time: 12/04/20  9:28 AM  Result Value Ref Range   Glucose-Capillary 70 70 - 99 mg/dL    Comment: Glucose reference range applies only to  samples taken after fasting for at least 8 hours.  Glucose, capillary     Status: Abnormal   Collection Time: 12/04/20 10:53 AM  Result Value Ref Range   Glucose-Capillary 43 (LL) 70 - 99 mg/dL    Comment: Glucose reference range applies only to samples taken after fasting for at least 8 hours.   Comment 1 Notify RN   Glucose, capillary     Status: None   Collection Time: 12/04/20 11:35 AM  Result Value Ref Range   Glucose-Capillary 88 70 - 99 mg/dL    Comment: Glucose reference range applies only to samples taken after fasting for at least 8 hours.  Hemoglobin and hematocrit, blood     Status: Abnormal   Collection Time: 12/04/20  1:15 PM  Result Value Ref Range   Hemoglobin 8.1 (L) 12.0 - 15.0 g/dL   HCT 25.6 (L) 36.0 - 46.0 %    Comment: Performed at [facility], [place] [address], [place], [place] [address]  Glucose, capillary     Status: Abnormal   Collection Time: 12/04/20  1:30 PM  Result Value Ref Range   Glucose-Capillary 40 (LL) 70 - 99 mg/dL    Comment: Glucose reference range applies only to samples taken after fasting for at least 8 hours.  Glucose, capillary     Status: Abnormal   Collection Time: 12/04/20  2:00 PM  Result Value Ref Range   Glucose-Capillary 103 (H) 70 - 99 mg/dL    Comment: Glucose reference range applies only to samples taken after fasting for at least 8 hours.  SARS CORONAVIRUS 2 (TAT 6-24 HRS) Nasopharyngeal Nasopharyngeal Swab     Status: Abnormal   Collection Time: 12/04/20  3:03 PM   Specimen: Nasopharyngeal Swab  Result Value Ref Range   SARS Coronavirus 2 POSITIVE (A) NEGATIVE    Comment: (NOTE) SARS-CoV-2 target nucleic acids are DETECTED.  The SARS-CoV-2 RNA is generally detectable in upper and lower respiratory specimens during the acute phase of infection. Positive results are indicative of the presence of SARS-CoV-2 RNA. Clinical correlation with patient history and other diagnostic information is  necessary to determine patient  infection status. Positive results do not rule out bacterial infection or co-infection with other viruses.  The expected result is Negative.  Fact Sheet for Patients: SugarRoll.be  Fact Sheet  for Healthcare Providers: https://www.woods-mathews.com/  This test is not yet approved or cleared by the [place] and  has been authorized for detection and/or diagnosis of SARS-CoV-2 by FDA under an Emergency Use Authorization (EUA). This EUA will remain  in effect (meaning this test can be used) for the duration of the COVID-19 declaration under Section 564(b)(1) of the Act, 21 U. S.C. section 360bbb-3(b)(1), unless the authorization is terminated or revoked sooner.   Performed at [facility], [place] [address] [place], [place] [address]   Glucose, capillary     Status: Abnormal   Collection Time: 12/04/20  3:31 PM  Result Value Ref Range   Glucose-Capillary 60 (L) 70 - 99 mg/dL    Comment: Glucose reference range applies only to samples taken after fasting for at least 8 hours.  Glucose, capillary     Status: None   Collection Time: 12/04/20  6:34 PM  Result Value Ref Range   Glucose-Capillary 91 70 - 99 mg/dL    Comment: Glucose reference range applies only to samples taken after fasting for at least 8 hours.  Glucose, capillary     Status: None   Collection Time: 12/04/20  8:33 PM  Result Value Ref Range   Glucose-Capillary 85 70 - 99 mg/dL    Comment: Glucose reference range applies only to samples taken after fasting for at least 8 hours.  Glucose, capillary     Status: Abnormal   Collection Time: 12/04/20 11:18 PM  Result Value Ref Range   Glucose-Capillary 102 (H) 70 - 99 mg/dL    Comment: Glucose reference range applies only to samples taken after fasting for at least 8 hours.  Glucose, capillary     Status: None   Collection Time: 12/05/20  3:36 AM  Result Value Ref Range   Glucose-Capillary 76 70 - 99 mg/dL     Comment: Glucose reference range applies only to samples taken after fasting for at least 8 hours.  Magnesium     Status: None   Collection Time: 12/05/20  3:45 AM  Result Value Ref Range   Magnesium 2.1 1.7 - 2.4 mg/dL    Comment: Performed at [facility] [address] [place], [place] [address]  CBC     Status: Abnormal   Collection Time: 12/05/20  3:45 AM  Result Value Ref Range   WBC 7.3 4.0 - 10.5 K/uL   RBC 2.69 (L) 3.87 - 5.11 MIL/uL   Hemoglobin 7.8 (L) 12.0 - 15.0 g/dL   HCT 25.0 (L) 36.0 - 46.0 %   MCV 92.9 80.0 - 100.0 fL   MCH 29.0 26.0 - 34.0 pg   MCHC 31.2 30.0 - 36.0 g/dL   RDW 15.5 11.5 - 15.5 %   Platelets 168 150 - 400 K/uL   nRBC 0.0 0.0 - 0.2 %    Comment: Performed at [facility], [place] [address] [place], [place] [address]  Comprehensive metabolic panel     Status: Abnormal   Collection Time: 12/05/20  3:45 AM  Result Value Ref Range   Sodium 137 135 - 145 mmol/L   Potassium 4.3 3.5 - 5.1 mmol/L    Comment: DELTA CHECK NOTED   Chloride 109 98 - 111 mmol/L   CO2 24 22 - 32 mmol/L   Glucose, Bld 72 70 - 99 mg/dL    Comment: Glucose reference range applies only to samples taken after fasting for at least 8 hours.   BUN 22 (  H) 6 - 20 mg/dL   Creatinine, Ser 2.14 (H) 0.44 - 1.00 mg/dL   Calcium 7.5 (L) 8.9 - 10.3 mg/dL   Total Protein 5.2 (L) 6.5 - 8.1 g/dL   Albumin 1.5 (L) 3.5 - 5.0 g/dL   AST 13 (L) 15 - 41 U/L   ALT 8 0 - 44 U/L   Alkaline Phosphatase 105 38 - 126 U/L   Total Bilirubin 0.6 0.3 - 1.2 mg/dL   GFR, Estimated 31 (L) >60 mL/min    Comment: (NOTE) Calculated using the CKD-EPI Creatinine Equation (2021)    Anion gap 4 (L) 5 - 15    Comment: Performed at [facility], [place] [address], [place], [place] [address]  Phosphorus     Status: None   Collection Time: 12/05/20  3:45 AM  Result Value Ref Range   Phosphorus 3.2 2.5 - 4.6 mg/dL    Comment: Performed at [facility] [address] [place], [place] [address]   Triglycerides     Status: None   Collection Time: 12/05/20  3:51 AM  Result Value Ref Range   Triglycerides 49 <150 mg/dL    Comment: Performed at [facility] [address], [place], [place] [address]  Glucose, capillary     Status: Abnormal   Collection Time: 12/05/20  6:13 AM  Result Value Ref Range   Glucose-Capillary 44 (LL) 70 - 99 mg/dL    Comment: Glucose reference range applies only to samples taken after fasting for at least 8 hours.   Comment 1 Notify RN   Glucose, capillary     Status: Abnormal   Collection Time: 12/05/20  6:35 AM  Result Value Ref Range   Glucose-Capillary 123 (H) 70 - 99 mg/dL    Comment: Glucose reference range applies only to samples taken after fasting for at least 8 hours.  Glucose, capillary     Status: None   Collection Time: 12/05/20  7:16 AM  Result Value Ref Range   Glucose-Capillary 78 70 - 99 mg/dL    Comment: Glucose reference range applies only to samples taken after fasting for at least 8 hours.    Recent Results (from the past 240 hour(s))  MRSA Next Gen by PCR, Nasal     Status: None   Collection Time: 12/03/20  1:56 PM   Specimen: Nasal Mucosa; Nasal Swab  Result Value Ref Range Status   MRSA by PCR Next Gen NOT DETECTED NOT DETECTED Final    Comment: (NOTE) The GeneXpert MRSA Assay (FDA approved for NASAL specimens only), is one component of a comprehensive MRSA colonization surveillance program. It is not intended to diagnose MRSA infection nor to guide or monitor treatment for MRSA infections. Test performance is not FDA approved in patients less than 10 years old. Performed at [facility], [place] [address] [place], [place] [address]   SARS CORONAVIRUS 2 (TAT 6-24 HRS) Nasopharyngeal Nasopharyngeal Swab     Status: Abnormal   Collection Time: 12/04/20  3:03 PM   Specimen: Nasopharyngeal Swab  Result Value Ref Range Status   SARS Coronavirus 2 POSITIVE (A) NEGATIVE Final    Comment: (NOTE) SARS-CoV-2 target  nucleic acids are DETECTED.  The SARS-CoV-2 RNA is generally detectable in upper and lower respiratory specimens during the acute phase of infection. Positive results are indicative of the presence of SARS-CoV-2 RNA. Clinical correlation with patient history and other diagnostic information is  necessary to determine patient infection status. Positive  results do not rule out bacterial infection or co-infection with other viruses.  The expected result is Negative.  Fact Sheet for Patients: SugarRoll.be  Fact Sheet for Healthcare Providers: https://www.woods-mathews.com/  This test is not yet approved or cleared by the [place] FDA and  has been authorized for detection and/or diagnosis of SARS-CoV-2 by FDA under an Emergency Use Authorization (EUA). This EUA will remain  in effect (meaning this test can be used) for the duration of the COVID-19 declaration under Section 564(b)(1) of the Act, 21 U. S.C. section 360bbb-3(b)(1), unless the authorization is terminated or revoked sooner.   Performed at [facility], [place] [address] [place], [place] [address]     Lipid Panel Recent Labs    12/05/20 0351  TRIG 49    Studies/Results: MR BRAIN WO CONTRAST  Result Date: 12/03/2020 CLINICAL DATA:  Seizure, abnormal neuro exam. EXAM: MRI HEAD WITHOUT CONTRAST TECHNIQUE: Multiplanar, multiecho pulse sequences of the brain and surrounding structures were obtained without intravenous contrast. COMPARISON:  Prior head CT examinations 12/03/2020 and earlier. FINDINGS: Brain: Cerebral volume is normal for age. There is fairly symmetric patchy cortical/subcortical T2/FLAIR hyperintense signal abnormality within the bilateral frontal lobes, parietooccipital lobes and posterior temporal lobes. Additionally, there are patchy foci of T2/FLAIR hyperintense signal abnormality within the posterior limbs of internal capsules and bilateral basal ganglia  bilaterally. 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 Alternative etiologies (including cerebritis) can not be excluded. The hippocampi are symmetric in size and signal. There is no acute infarct. No evidence of an intracranial mass. No chronic intracranial blood products. No extra-axial fluid collection. No midline shift. Vascular: Maintained flow voids within the proximal large arterial vessels. Skull and upper cervical spine: No focal suspicious marrow lesion. Sinuses/Orbits: Visualized orbits show no acute finding. Trace bilateral ethmoid sinus mucosal thickening. Other: Posterior scalp edema bilaterally. These results will be called to the ordering clinician or representative by the Radiologist Assistant, and communication documented in the PACS or [employer]. IMPRESSION: Fairly symmetric patchy cortical/subcortical T2/FLAIR hyperintense signal abnormality within the bilateral frontal lobes, parietooccipital lobes and posterior temporal lobes. Additional patchy foci of T2/FLAIR hyperintense signal abnormality within the posterior limbs of the internal capsules and bilateral basal ganglia bilaterally. 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 Alternative etiologies (including cerebritis) can not be excluded. Clinical correlation is recommended. Additionally, a follow-up brain MRI is recommended to assess for stability, resolution or progression of these signal changes. Electronically Signed   By: Kellie Simmering D.O.   On: 12/03/2020 16:31   EEG adult  Result Date: 12/03/2020 Lora Havens, MD     12/03/2020  5:56 PM Patient Name: [name] MRN: [id number] Epilepsy Attending: Lora Havens Referring Physician/Provider: Dr Kerney Elbe Date: 12/03/2020 Duration: 22.05 mins Patient history: 30yo F with ams. EEG to evaluate for seizure. Level of alertness: comatose AEDs during  EEG study: GBP, LEV, propofol, Ativan Technical aspects: This EEG study was done with scalp electrodes positioned according to the 10-20 International system of electrode placement. Electrical activity was acquired at a sampling rate of 500Hz  and reviewed with a high frequency filter of 70Hz  and a low frequency filter of 1Hz . EEG data were recorded continuously and digitally stored. Description:  EEG showed continuous generalized 2-3Hz  delta slowing admixed with an excessive amount of 15 to 18 Hz beta activity with irregular morphology distributed symmetrically and diffusely. Hyperventilation and photic stimulation were not  performed.    ABNORMALITY - Continuous slow, generalized - Excessive beta, generalized  IMPRESSION: This study is suggestive of severe diffuse encephalopathy, nonspecific etiology but likely related to sedation. No seizures or epileptiform discharges were seen throughout the recording.  Dr Cheral Marker was notified.  Priyanka Barbra Sarks   Overnight EEG with video  Result Date: 12/04/2020 Lora Havens, MD     12/04/2020  8:48 AM Patient Name: [name] MRN: [id number] Epilepsy Attending: Lora Havens Referring Physician/Provider: Dr Kerney Elbe Duration: 12/03/2020 1751 to 12/04/2020 0845  Patient history: 30yo F with ams. EEG to evaluate for seizure.  Level of alertness: comatose  AEDs during EEG study: GBP, LEV, propofol  Technical aspects: This EEG study was done with scalp electrodes positioned according to the 10-20 International system of electrode placement. Electrical activity was acquired at a sampling rate of 500Hz  and reviewed with a high frequency filter of 70Hz  and a low frequency filter of 1Hz . EEG data were recorded continuously and digitally stored.  Description:  EEG showed continuous generalized 2-3Hz  delta slowing admixed with an excessive amount of 15 to 18 Hz beta activity with irregular morphology distributed symmetrically and diffusely. Hyperventilation and photic  stimulation were not performed.    ABNORMALITY - Continuous slow, generalized - Excessive beta, generalized  IMPRESSION: This study is suggestive of severe diffuse encephalopathy, nonspecific etiology but likely related to sedation. No seizures or epileptiform discharges were seen throughout the recording.  Priyanka Barbra Sarks     Medications: Scheduled:  amLODipine  10 mg Per Tube Daily   carvedilol  12.5 mg Per Tube BID WC   chlorhexidine gluconate (MEDLINE KIT)  15 mL Mouth Rinse BID   Chlorhexidine Gluconate Cloth  6 each Topical Daily   feeding supplement (VITAL HIGH PROTEIN)  1,000 mL Per Tube Q24H   gabapentin  200 mg Per Tube TID   heparin  5,000 Units Subcutaneous Q8H   insulin aspart  0-6 Units Subcutaneous Q4H   insulin detemir  5 Units Subcutaneous Daily   LORazepam  0.5 mg Per Tube BID   mouth rinse  15 mL Mouth Rinse 10 times per day   metoCLOPramide  5 mg Per Tube TID AC   pantoprazole sodium  40 mg Per Tube Daily   sodium chloride flush  10-40 mL Intracatheter Q12H   Continuous:  sodium chloride 10 mL/hr at 12/05/20 0600   dextrose 50 mL/hr at 12/05/20 0600   levETIRAcetam Stopped (12/04/20 1953)   propofol (DIPRIVAN) infusion 40 mcg/kg/min (12/05/20 0600)   thiamine injection Stopped (12/04/20 2208)    Assessment: 30 year old female presenting with new onset of GTC seizure activity, preceded by AMS. Markedly elevated BP on arrival to the ED.  1. Exam in the ED yesterday revealed an encephalopathic patient with no clinical seizure activity following Keppra load. Exam Tuesday while on propofol with no clinical seizure activity,. otherwise uninformative due to sedation. Exam today reveals significant improvement. She is awake and following simple commands, but is still intubated.  2. Hypertensive on presentation. Now resolved.   3. Overall presentation in conjunction with MRI findings most consistent with PRES.   4. Received 1500 mg IV Keppra load in the ED. Started on  scheduled Keppra at 500 mg BID 5. EEG: -LTM EEG report from Tuesday AM documented continuous generalized slowing and excessive beta activity. The study is suggestive of severe diffuse encephalopathy, nonspecific etiology but likely related to sedation. No seizures or epileptiform discharges were seen throughout the  recording.  -LTM EEG report from today AM: Continuous slow, generalized. This study was initially suggestive of severe diffuse encephalopathy, nonspecific etiology but likely related to sedation.  As sedation was weaned, EEG improved and was suggestive of moderate diffuse encephalopathy.  No seizures or epileptiform discharges were seen throughout the recording. 6. MRI brain reveals fairly symmetric patchy cortical/subcortical T2/FLAIR hyperintense signal abnormality within the bilateral frontal lobes, parietooccipital lobes and posterior temporal lobes. Additional patchy foci of T2/FLAIR hyperintense signal abnormality within the posterior limbs of the internal capsules and bilateral basal ganglia bilaterally. 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 Alternative etiologies (including cerebritis) can not be excluded. Clinical correlation is recommended. Additionally, a follow-up brain MRI is recommended to assess for stability, resolution or progression of these signal changes.     Recommendations: 1. Discontinuing LTM 2. Continue Keppra scheduled dosing at 500 mg q12h.  3. IV high dose thiamine 4. Inpatient seizure precautions 5. Frequent neuro checks.  6. Continue BP management with SBP goal of < 140, DBP < 90  7. Will need repeat MRI brain in 7 days from admission to assess for possible improvement.  8. When she is back to baseline, will need to discuss outpatient seizure precautions with the patient.  9. From and neurological standpoint, she can be extubated and continued off sedation.   35 minutes spent in the  neurological evaluation and management of this critically ill patient.    LOS: 2 days   _0  signed: Dr. Kerney Elbe 12/05/2020  7:26 AM

## 2020-12-05 NOTE — Progress Notes (Signed)
EEG maintenance complete. No skin breakdown at [place] A1 F7 continue to monitor

## 2020-12-05 NOTE — Progress Notes (Signed)
Hypoglycemic Event  CBG: 44  Treatment: D50 50 mL (25 gm)  Symptoms: Sweaty  Follow-up CBG: [id number] CBG Result:123  Possible Reasons for Event: Medication regimen: insulin and D10 doses and Unknown  Comments/MD notified:Elink physician notified    [name]

## 2020-12-05 NOTE — Procedures (Addendum)
Patient Name: [name]  MRN: [id number]  Epilepsy Attending: Lora Havens  Referring Physician/Provider: Dr Kerney Elbe Duration: 12/04/2020 1751 to 12/05/2020 1126   Patient history: 30yo F with ams. EEG to evaluate for seizure.    Level of alertness: lethargic/sedated   AEDs during EEG study: GBP, LEV, propofol   Technical aspects: This EEG study was done with scalp electrodes positioned according to the 10-20 International system of electrode placement. Electrical activity was acquired at a sampling rate of 500Hz  and reviewed with a high frequency filter of 70Hz  and a low frequency filter of 1Hz . EEG data were recorded continuously and digitally stored.    Description:  EEG initially showed continuous generalized 3-6Hz  theta-delta slowing admixed with 15 to 18 Hz generalized beta activity. As sedation was weaned, EEG showed predominantly 6 to 8 Hz theta-alpha activity as well as intermittent generalized 2 to 3 Hz delta slowing.  Hyperventilation and photic stimulation were not performed.      ABNORMALITY - Continuous slow, generalized   IMPRESSION: This study was initially suggestive of severe diffuse encephalopathy, nonspecific etiology but likely related to sedation.  As sedation was weaned, EEG improved and was suggestive of moderate diffuse encephalopathy.  No seizures or epileptiform discharges were seen throughout the recording.   [name]

## 2020-12-05 NOTE — Progress Notes (Signed)
LTM monitor Disconnected and no skin breakdown noted. [facility] called. Charged.

## 2020-12-05 NOTE — Progress Notes (Addendum)
NAME:  [name], MRN:  [id number], DOB:  23-Jan-1991, LOS: 2 ADMISSION DATE:  12/03/2020, CONSULTATION DATE: 12/03/2020 REFERRING MD: Judeth Porch, CHIEF COMPLAINT: Encephalopathy  History of Present Illness:  30 year old with multiple chronic medical conditions who had recurrent nausea and vomiting overnight, concern for encephalopathy by husband, some flexor posturing prior to arrival he was intubated in the ED for combativeness.  History per husband at bedside.  Patient is intubated and sedated.  Patient has chronic nausea and vomiting.  Seems chalked up to gastroparesis.  Notably gastric emptying study normal 2021.  Has active over the last several days.  Was in 4/1 for similar.  Sent home.  Was worse yesterday through the night.  Woke up in the night vomiting multiple times.  After, she seemed out of it.  Kind of looking through her husband, not acknowledging him.  Did recognize photo of their daughter.  Worked on getting her to the emergency room.  They are at the door to go home and she slumped down and had what is described as flexing posturing of the upper extremities.  Lasted about 60 seconds.  He went to get help.  Immediately thereafter she was somewhat hysterical, screaming, scared.  They came to the ED.  She was combative per ED notes.  Was intubated.  Labs notable for sugar in the 300s with anion gap of 18, normal lactate.  Hypokalemic.  Creatinine improved to 1.4 from 2 few days prior.  Creatinine not far off baseline.  Chest x-ray appears clear.  CT abdomen pelvis with anasarca, ascites.  CT chest on my interpretation reveals bibasilar atelectasis, faint groundglass opacity scattered in the lingula/left upper lobe.  Neurology was consulted.  Broad differential.  Feel she be better served at [facility] given greater neurology capacity, diagnostic test.  Stat MRI and stat spot EEG ordered but not resulted at time of note.  Pertinent  Medical History  Type 1 diabetes, anxiety, chronic nausea  vomiting, BKA, foot infection recent completed IV antibiotics with PICC line in place  Significant Hospital Events: Including procedures, antibiotic start and stop dates in addition to other pertinent events   9/5 admitted with concern for seizure activity, intubated, neurology recommended transfer to [facility], hyperglycemic concern for DKA on arrival  Imaging:  - MRI 9/5 consistent with PRES - EEG 9/6 Excessive beta, continuous slow. Sever encephalopathy likely related to sedation   Interim History / Subjective:  Ongoing hypoglycemia overnight. On d10 gtt at 76 and TF  Intubated. Off sedation  +6.3Ml admit, +2.3L yest, 650 UOP.  Unable to obtain subjective evaluation due to patient status   Objective   Blood pressure (!) 149/97, pulse 79, temperature 98.1 F (36.7 C), temperature source Bladder, resp. rate (!) 0, height 5' 7" (1.702 m), weight 74.1 kg, SpO2 100 %.    Vent Mode: CPAP;PSV FiO2 (%):  [40 %] 40 % Set Rate:  [16 bmp] 16 bmp Vt Set:  [490 mL] 490 mL PEEP:  [5 cmH20] 5 cmH20 Pressure Support:  [5 cmH20] 5 cmH20 Plateau Pressure:  [14 cmH20-17 cmH20] 17 cmH20   Intake/Output Summary (Last 24 hours) at 12/05/2020 0921 Last data filed at 12/05/2020 0800 Gross per 24 hour  Intake 2677.99 ml  Output 625 ml  Net 2052.99 ml   Filed Weights   12/03/20 2000 12/05/20 0500  Weight: 70.4 kg 74.1 kg    Examination:  General:  in bed, NAD, appears comfortable HEENT: MM pink/moist, anicteric, atraumatic Neuro: GCS 11t, RASS  0, PERRL 3mm, f/c CV: S1S2, nsr, no m/r/g appreciated PULM:   air movement in all lobes, Trachea midline, chest expansion symmetric GI: soft, bsx4 active, non distended  Extremities: warm/dry, no pretrtibial edema edema, capillary refill less than 3 seconds  Skin: RT BKA, left foot with chronic wound. no rashes or lesions   Resolved Hospital Problem list   Hypokalemia  Assessment & Plan:  Acute metabolic encephalopathy:   multifactorial.  Hypertensive in ED with MRI suggestive of PRES.  HX Substance abuse (THC positive in ED). GCS 11T on exam off sedation Seizure like activity: witnessed in ED.  -Neurology following. Workup per neurology. Appreciate assistance -Keppra per neuro -EEG per neuro -Continue home ativan -Goal SBP less than 160 per neuro. Avoid hypotension  Hypoglycemia: BG 40-120 DKA- resolved: AG 4  -Blood Glucose goal 140-180. -Continue d10gtt and TF -Decreased levemir to 5 daily on 9.6, will now DC levemir per recs. -Continue Novolog 0-6 per DM recs. Appreciate assistance. -Continue to monitor CBG. -Monitor Gap on BMP  Acute kidney injury Creat 1.74>2.14, 1.45 on admission. 650 uop yesterday. Unclear reason for worsening. Hypertensive initially, may be secondary to blood pressure drop or episode of hypotension on 9/6 (85/58) -Obtain renal US. -Ensure renal perfusion. Goal MAP 65 or greater. -Avoid neprotoxic drugs as possible. -Strict I&O's -Follow up AM creatinine  Acute hypoxemic respiratory failure: In setting of combativeness, intubated in the ED.  CT chest with bilateral basilar dependent atelectasis and faint grade glass opacity in the lingula/left upper lobe.  At risk for aspiration given recurrent episodes of vomiting. -LTVV strategy with tidal volumes of 4-8 cc/kg ideal body weight -Goal plateau pressures less than 30 and driving pressures less than 15 -Wean PEEP/FiO2 for SpO2 92-98% -VAP bundle -Daily SAT and SBT. Hope to extubate today -PAD bundle with Propofol gtt and fentanyl gtt. Stop sedation in preporation for extubation -Follow intermittent CXR and ABG PRN  Nausea and vomiting: Unclear etiology.  Presumed gastroparesis but notably gastric emptying study 2021 was normal. -Monitor QTC -Continue reglan -Continue prn zofran.  Anemia: hemoglobin drop from 9.2 >7.5>6.1>7.8  No obvious blood loss Microcytic Anemia -follow up H/H -transfuse for Hgb <7%, active bleeding  Hypertension:  Appears poorly controlled.  On 3 medicines at home. -Goal SBP 100 to 160 -Continue home amlodipine and coreg. Will restart home hydralazine if goals are not met.  Best Practice (right click and "Reselect all SmartList Selections" daily)   Diet/type: tubefeeds DVT prophylaxis: prophylactic heparin  GI prophylaxis: PPI Lines: Central line and yes and it is still needed Foley:  N/A Code Status:  full code Last date of multidisciplinary goals of care discussion [n/a]   Critical care time: 32 minutes     Scott , Jr., MSN, APRN, AGACNP-BC [facility]  12/05/2020 , 9:21 AM  Please see [email or web address] for pager details  If no response, please call [phone] After hours, please call Elink at [phone]         

## 2020-12-05 NOTE — Progress Notes (Signed)
E-Link notified in regards to patient B/P

## 2020-12-05 NOTE — Telephone Encounter (Signed)
Transition Care Management Unsuccessful Follow-up Telephone Call  Date of discharge and from where:  11/29/2020-[place]   Attempts:  3rd Attempt  Reason for unsuccessful TCM follow-up call:  No answer/busy

## 2020-12-06 DIAGNOSIS — R451 Restlessness and agitation: Secondary | ICD-10-CM | POA: Diagnosis not present

## 2020-12-06 DIAGNOSIS — R410 Disorientation, unspecified: Secondary | ICD-10-CM | POA: Diagnosis not present

## 2020-12-06 DIAGNOSIS — N179 Acute kidney failure, unspecified: Secondary | ICD-10-CM

## 2020-12-06 DIAGNOSIS — I6783 Posterior reversible encephalopathy syndrome: Secondary | ICD-10-CM | POA: Diagnosis not present

## 2020-12-06 LAB — BASIC METABOLIC PANEL
Anion gap: 5 (ref 5–15)
BUN: 25 mg/dL — ABNORMAL HIGH (ref 6–20)
CO2: 23 mmol/L (ref 22–32)
Calcium: 7.7 mg/dL — ABNORMAL LOW (ref 8.9–10.3)
Chloride: 109 mmol/L (ref 98–111)
Creatinine, Ser: 1.91 mg/dL — ABNORMAL HIGH (ref 0.44–1.00)
GFR, Estimated: 36 mL/min — ABNORMAL LOW (ref >60)
Glucose, Bld: 75 mg/dL (ref 70–99)
Potassium: 3.5 mmol/L (ref 3.5–5.1)
Sodium: 137 mmol/L (ref 135–145)

## 2020-12-06 LAB — GLUCOSE, CAPILLARY
Glucose-Capillary: 161 mg/dL — ABNORMAL HIGH (ref 70–99)
Glucose-Capillary: 109 mg/dL — ABNORMAL HIGH (ref 70–99)
Glucose-Capillary: 226 mg/dL — ABNORMAL HIGH (ref 70–99)
Glucose-Capillary: 277 mg/dL — ABNORMAL HIGH (ref 70–99)
Glucose-Capillary: 224 mg/dL — ABNORMAL HIGH (ref 70–99)
Glucose-Capillary: 166 mg/dL — ABNORMAL HIGH (ref 70–99)

## 2020-12-06 LAB — CBC
HCT: 26 % — ABNORMAL LOW (ref 36.0–46.0)
Hemoglobin: 8 g/dL — ABNORMAL LOW (ref 12.0–15.0)
MCH: 29 pg (ref 26.0–34.0)
MCHC: 30.8 g/dL (ref 30.0–36.0)
MCV: 94.2 fL (ref 80.0–100.0)
Platelets: 165 10*3/uL (ref 150–400)
RBC: 2.76 MIL/uL — ABNORMAL LOW (ref 3.87–5.11)
RDW: 14.6 % (ref 11.5–15.5)
WBC: 7.4 10*3/uL (ref 4.0–10.5)
nRBC: 0 % (ref 0.0–0.2)

## 2020-12-06 MED ORDER — LEVETIRACETAM 500 MG PO TABS
500.0000 mg | ORAL_TABLET | Freq: Two times a day (BID) | ORAL | Status: DC
  Administered 2020-12-06 – 2020-12-10 (×8): 500 mg via ORAL
  Filled 2020-12-06 (×8): qty 1

## 2020-12-06 MED ORDER — POTASSIUM CHLORIDE CRYS ER 20 MEQ PO TBCR
20.0000 meq | EXTENDED_RELEASE_TABLET | Freq: Once | ORAL | Status: AC
  Administered 2020-12-06: 20 meq via ORAL
  Filled 2020-12-06: qty 1

## 2020-12-06 MED ORDER — BUSPIRONE HCL 10 MG PO TABS
5.0000 mg | ORAL_TABLET | Freq: Three times a day (TID) | ORAL | Status: DC
  Administered 2020-12-06 – 2020-12-10 (×14): 5 mg via ORAL
  Filled 2020-12-06 (×14): qty 1

## 2020-12-06 MED ORDER — SODIUM CHLORIDE 0.9 % IV SOLN
12.5000 mg | Freq: Four times a day (QID) | INTRAVENOUS | Status: AC | PRN
  Administered 2020-12-06: 12.5 mg via INTRAVENOUS
  Filled 2020-12-06: qty 0.5

## 2020-12-06 MED ORDER — HYDRALAZINE HCL 50 MG PO TABS
50.0000 mg | ORAL_TABLET | Freq: Three times a day (TID) | ORAL | Status: DC
  Administered 2020-12-06 – 2020-12-07 (×3): 50 mg via ORAL
  Filled 2020-12-06 (×3): qty 1

## 2020-12-06 MED ORDER — SODIUM CHLORIDE 0.9 % IV SOLN: 12.5000 mg | Freq: Four times a day (QID) | INTRAVENOUS | Status: DC | PRN

## 2020-12-06 MED ORDER — INSULIN DETEMIR 100 UNIT/ML ~~LOC~~ SOLN
3.0000 [IU] | Freq: Every day | SUBCUTANEOUS | Status: DC
  Administered 2020-12-06 – 2020-12-07 (×2): 3 [IU] via SUBCUTANEOUS
  Filled 2020-12-06 (×2): qty 0.03

## 2020-12-06 MED ORDER — LABETALOL HCL 5 MG/ML IV SOLN
10.0000 mg | INTRAVENOUS | Status: DC | PRN
  Administered 2020-12-06 – 2020-12-07 (×4): 20 mg via INTRAVENOUS
  Filled 2020-12-06 (×5): qty 4

## 2020-12-06 NOTE — Progress Notes (Signed)
eLink Physician-Brief Progress Note Patient Name: [name] DOB: [birth year] MRN: [id number]   Date of Service  12/06/2020  HPI/Events of Note  Patient with sub-optimal blood pressure control.  eICU Interventions  PRN Labetalol ordered targeting BP < 150 mmHg.        Kerry Kass Krayton Wortley 12/06/2020, 12:02 AM

## 2020-12-06 NOTE — Progress Notes (Signed)
NAME:  [name], MRN:  [id number], DOB:  08-Nov-1990, LOS: 3 ADMISSION DATE:  12/03/2020, CONSULTATION DATE: 12/03/2020 REFERRING MD: Judeth Porch, CHIEF COMPLAINT: Encephalopathy  History of Present Illness:  30 year old with multiple chronic medical conditions who had recurrent nausea and vomiting overnight, concern for encephalopathy by husband, some flexor posturing prior to arrival he was intubated in the ED for combativeness.  History per husband at bedside.  Patient is intubated and sedated.  Patient has chronic nausea and vomiting.  Seems chalked up to gastroparesis.  Notably gastric emptying study normal 2021.  Has active over the last several days.  Was in 21/1 for similar.  Sent home.  Was worse yesterday through the night.  Woke up in the night vomiting multiple times.  After, she seemed out of it.  Kind of looking through her husband, not acknowledging him.  Did recognize photo of their daughter.  Worked on getting her to the emergency room.  They are at the door to go home and she slumped down and had what is described as flexing posturing of the upper extremities.  Lasted about 60 seconds.  He went to get help.  Immediately thereafter she was somewhat hysterical, screaming, scared.  They came to the ED.  She was combative per ED notes.  Was intubated.  Labs notable for sugar in the 300s with anion gap of 18, normal lactate.  Hypokalemic.  Creatinine improved to 1.4 from 2 few days prior.  Creatinine not far off baseline.  Chest x-ray appears clear.  CT abdomen pelvis with anasarca, ascites.  CT chest on my interpretation reveals bibasilar atelectasis, faint groundglass opacity scattered in the lingula/left upper lobe.  Neurology was consulted.  Broad differential.  Feel she be better served at [facility] given greater neurology capacity, diagnostic test.  Stat MRI and stat spot EEG ordered but not resulted at time of note.  Pertinent  Medical History  Type 1 diabetes, anxiety, chronic nausea  vomiting, BKA, foot infection recent completed IV antibiotics with PICC line in place  Significant Hospital Events: Including procedures, antibiotic start and stop dates in addition to other pertinent events   9/5 admitted with concern for seizure activity, intubated, neurology recommended transfer to [facility], hyperglycemic concern for DKA on arrival  Imaging:  - MRI 9/5 consistent with PRES - EEG 9/6 Excessive beta, continuous slow. Sever encephalopathy likely related to sedation   Interim History / Subjective:  No acute events overnight. PRN labetalol added for HTN  -2.8L yesterday, +3.4L admit  700stool 4L uop  Tmax 99  Subjective: denies chest pain, denies SOB, states diarrhea is common for her.  Objective   Blood pressure (!) 141/87, pulse 83, temperature 98 F (36.7 C), temperature source Oral, resp. rate 19, height 5' 7"  (1.702 m), weight 74.1 kg, SpO2 97 %. RA    Vent Mode: CPAP;PSV FiO2 (%):  [40 %] 40 % PEEP:  [5 cmH20] 5 cmH20 Pressure Support:  [5 cmH20] 5 cmH20   Intake/Output Summary (Last 24 hours) at 12/06/2020 0905 Last data filed at 12/06/2020 0600 Gross per 24 hour  Intake 1817.51 ml  Output 4750 ml  Net -2932.49 ml   Filed Weights   12/03/20 2000 12/05/20 0500  Weight: 70.4 kg 74.1 kg    Examination:  General:  in bed, NAD, on cell phone HEENT: MM pink/moist, anicteric, atraumatic Neuro: GCS 15, RASS 0, PERRL 29m CV: S1S2, NSR, no m/r/g appreciated PULM:  clear in the upper lobes and in the  lower lobes, Trachea midline, chest expansion symmetric GI: soft, bsx4 active, FMS in place Extremities: warm/dry, no pretibial edema, capillary refill less than 3 seconds, RT BKA Skin: Chronic left toe wound, no rashes or lesions  Resolved Hospital Problem list   Hypokalemia Acute hypoxemic respiratory failure DKA Assessment & Plan:  Acute metabolic encephalopathy- Improving   multifactorial. Hypertensive in ED with MRI suggestive of PRES.  HX  Substance abuse (THC positive in ED). GCS 15 on exam. Seizure like activity: witnessed in ED.  -Neurology following, appreciate assistance. -AED per neuro -Continue home ativan -Goal SBP less than 140 per neuro. Avoid hypotension  Hypertension: Appears poorly controlled.  On 3 medicines at home. -Goal SBP 100 to 140 -Continue home amlodipine and coreg.  -Restart home hydral at 58m q8h  DM1 BG 75-251 -Blood Glucose goal 140-180. -SSI -Stopping D10, now on diet -Starting levemir 3u daily. Appreciate Diabetes Coordinator input.  Acute kidney injury Creat 1.74>2.14>1.91, 1.45 on admission. 4L uop yesterday.  Unclear reason for worsening. Hypertensive initially, may be secondary to blood pressure drop or episode of hypotension on 9/6 (85/58). Renal [place] for hydro or obstructive nephropathy  -Ensure renal perfusion. Goal MAP 65 or greater. -Avoid neprotoxic drugs as possible. -Strict I&O's -Follow up AM creatinine  Diarrhea Patient states she has had diarrhea for several years and this is her baseline. She wears diapers/pads at home for this. FMS placed overnight by RN. -Remove FMS by 9/9 0700 -Outpatient PCP follow up -supportive care  Nausea and vomiting: Unclear etiology.  Presumed gastroparesis but notably gastric emptying study 2021 was normal. -Monitor QTC -Continue reglan -Continue PRN zofran  Anemia: hemoglobin drop from 9.2 >7.5>6.1>7.8>8.0 . No obvious blood loss -transfuse for Hgb <7%, or if active bleeding   Stable to transfer to progressive care. Best Practice (right click and "Reselect all SmartList Selections" daily)   Diet/type: Regular consistency (see orders) DVT prophylaxis: prophylactic heparin  GI prophylaxis: N/A and PPI Lines: PICC in place from outpatient osteo treatment. Appears completed. ? If still needed. Foley:  N/A Code Status:  full code Last date of multidisciplinary goals of care discussion [n/a]   Critical care time: n/a      TRedmond School, MSN, APRN, AGACNP-BC [facility]  12/06/2020 , 9:05 AM  Please see [email or web address] for pager details  If no response, please call [phone] After hours, please call Elink at [phone]

## 2020-12-06 NOTE — Progress Notes (Signed)
Inpatient Diabetes Program Recommendations  AACE/ADA: New Consensus Statement on Inpatient Glycemic Control (2015)  Target Ranges:  Prepandial:   less than 140 mg/dL      Peak postprandial:   less than 180 mg/dL (1-2 hours)      Critically ill patients:  140 - 180 mg/dL   Lab Results  Component Value Date   GLUCAP 109 (H) 12/06/2020   HGBA1C 7.2 (H) 11/13/2020    Review of Glycemic Control Results for [name], [name] (MRN [id number]) as of 12/06/2020 08:52  Ref. Range 12/05/2020 19:45 12/05/2020 23:47 12/06/2020 03:31 12/06/2020 08:04  Glucose-Capillary Latest Ref Range: 70 - 99 mg/dL 241 (H) 202 (H) 161 (H) 109 (H)  Diabetes history: Type 1 DM Outpatient Diabetes medications: Lantus 8 units QHS, Humalog 0-5 units TID Current orders for Inpatient glycemic control: Novolog 0-6 units Q4H D10% @ 23m/hr   Inpatient Diabetes Program Recommendations:     Now that patient had diet order, consider: -Adding Levemir 4 units QD -Novolog 0-6 units TID & HS -D10%?  Thanks, LBronson Curb MSN, RNC-OB Diabetes Coordinator [phone])

## 2020-12-06 NOTE — Plan of Care (Signed)

## 2020-12-06 NOTE — Progress Notes (Signed)
Subjective: Awake and  alert this AM. Conversant.   Objective: Current vital signs: BP (!) 150/87   Pulse 86   Temp 98 F (36.7 C) (Oral)   Resp 20   Ht 5' 7"  (1.702 m)   Wt 74.1 kg   SpO2 96%   BMI 25.59 kg/m  Vital signs in last 24 hours: Temp:  [97.5 F (36.4 C)-99 F (37.2 C)] 98 F (36.7 C) (09/08 0806) Pulse Rate:  [81-96] 86 (09/08 0930) Resp:  [0-27] 20 (09/08 0930) BP: (131-176)/(82-136) 150/87 (09/08 0930) SpO2:  [95 %-100 %] 96 % (09/08 0930)  Intake/Output from previous day: 09/07 0701 - 09/08 0700 In: 1922.2 [P.O.:240; I.V.:1251.9; NG/GT:80; IV Piggyback:350.4] Out: 4750 [Urine:4050; Stool:700] Intake/Output this shift: Total I/O In: 231.3 [I.V.:131.3; IV Piggyback:100] Out: -  Nutritional status:  Diet Order             Diet heart healthy/carb modified Room service appropriate? Yes; Fluid consistency: Thin  Diet effective now                  HEENT: [place]/AT Lungs: Respirations unlabored Ext: Right BKA  Neurologic Exam: Ment: Awake and fully alert. Speech fluent with intact comprehension. No dysarthria. Fully oriented.  CN: PERRL. EOMI. Fixates and tracks normally. Face symmetric.  Motor: 5/5 BUE  5/5 bilateral hip flexion and knee extension.  Sensory: Intact to FT x 4 Cerebellar: No ataxia with FNF bilaterally     Lab Results: Results for orders placed or performed during the hospital encounter of 12/03/20 (from the past 48 hour(s))  Glucose, capillary     Status: Abnormal   Collection Time: 12/04/20 10:53 AM  Result Value Ref Range   Glucose-Capillary 43 (LL) 70 - 99 mg/dL    Comment: Glucose reference range applies only to samples taken after fasting for at least 8 hours.   Comment 1 Notify RN   Glucose, capillary     Status: None   Collection Time: 12/04/20 11:35 AM  Result Value Ref [place]   Glucose-Capillary 88 70 - 99 mg/dL    Comment: Glucose reference range applies only to samples taken after fasting for at least 8 hours.   Hemoglobin and hematocrit, blood     Status: Abnormal   Collection Time: 12/04/20  1:15 PM  Result Value Ref Range   Hemoglobin 8.1 (L) 12.0 - 15.0 g/dL   HCT 25.6 (L) 36.0 - 46.0 %    Comment: Performed at [facility], [place] [address] [place], [place] [address]  Glucose, capillary     Status: Abnormal   Collection Time: 12/04/20  1:30 PM  Result Value Ref Range   Glucose-Capillary 40 (LL) 70 - 99 mg/dL    Comment: Glucose reference range applies only to samples taken after fasting for at least 8 hours.  Glucose, capillary     Status: Abnormal   Collection Time: 12/04/20  2:00 PM  Result Value Ref Range   Glucose-Capillary 103 (H) 70 - 99 mg/dL    Comment: Glucose reference range applies only to samples taken after fasting for at least 8 hours.  SARS CORONAVIRUS 2 (TAT 6-24 HRS) Nasopharyngeal Nasopharyngeal Swab     Status: Abnormal   Collection Time: 12/04/20  3:03 PM   Specimen: Nasopharyngeal Swab  Result Value Ref Range   SARS Coronavirus 2 POSITIVE (A) NEGATIVE    Comment: (NOTE) SARS-CoV-2 target nucleic acids are DETECTED.  The SARS-CoV-2 RNA is generally detectable in upper and lower respiratory specimens during  the acute phase of infection. Positive results are indicative of the presence of SARS-CoV-2 RNA. Clinical correlation with patient history and other diagnostic information is  necessary to determine patient infection status. Positive results do not rule out bacterial infection or co-infection with other viruses.  The expected result is Negative.  Fact Sheet for Patients: SugarRoll.be  Fact Sheet for Healthcare Providers: https://www.woods-mathews.com/  This test is not yet approved or cleared by the [place] FDA and  has been authorized for detection and/or diagnosis of SARS-CoV-2 by FDA under an Emergency Use Authorization (EUA). This EUA will remain  in effect (meaning this test can be used) for the  duration of the COVID-19 declaration under Section 564(b)(1) of the Act, 21 U. S.C. section 360bbb-3(b)(1), unless the authorization is terminated or revoked sooner.   Performed at [facility], [place] [address], [place], [place] [address]   Glucose, capillary     Status: Abnormal   Collection Time: 12/04/20  3:31 PM  Result Value Ref Range   Glucose-Capillary 60 (L) 70 - 99 mg/dL    Comment: Glucose reference range applies only to samples taken after fasting for at least 8 hours.  Glucose, capillary     Status: None   Collection Time: 12/04/20  6:34 PM  Result Value Ref Range   Glucose-Capillary 91 70 - 99 mg/dL    Comment: Glucose reference range applies only to samples taken after fasting for at least 8 hours.  Glucose, capillary     Status: None   Collection Time: 12/04/20  8:33 PM  Result Value Ref [place]   Glucose-Capillary 85 70 - 99 mg/dL    Comment: Glucose reference range applies only to samples taken after fasting for at least 8 hours.  Glucose, capillary     Status: Abnormal   Collection Time: 12/04/20 11:18 PM  Result Value Ref Range   Glucose-Capillary 102 (H) 70 - 99 mg/dL    Comment: Glucose reference range applies only to samples taken after fasting for at least 8 hours.  Glucose, capillary     Status: None   Collection Time: 12/05/20  3:36 AM  Result Value Ref Range   Glucose-Capillary 76 70 - 99 mg/dL    Comment: Glucose reference range applies only to samples taken after fasting for at least 8 hours.  Magnesium     Status: None   Collection Time: 12/05/20  3:45 AM  Result Value Ref Range   Magnesium 2.1 1.7 - 2.4 mg/dL    Comment: Performed at [facility] [address] [place], [place] [address]  CBC     Status: Abnormal   Collection Time: 12/05/20  3:45 AM  Result Value Ref Range   WBC 7.3 4.0 - 10.5 K/uL   RBC 2.69 (L) 3.87 - 5.11 MIL/uL   Hemoglobin 7.8 (L) 12.0 - 15.0 g/dL   HCT 25.0 (L) 36.0 - 46.0 %   MCV 92.9 80.0 - 100.0 fL   MCH  29.0 26.0 - 34.0 pg   MCHC 31.2 30.0 - 36.0 g/dL   RDW 15.5 11.5 - 15.5 %   Platelets 168 150 - 400 K/uL   nRBC 0.0 0.0 - 0.2 %    Comment: Performed at [facility], [place] [address], [place], [place] [address]  Comprehensive metabolic panel     Status: Abnormal   Collection Time: 12/05/20  3:45 AM  Result Value Ref Range   Sodium 137 135 - 145 mmol/L  Potassium 4.3 3.5 - 5.1 mmol/L    Comment: DELTA CHECK NOTED   Chloride 109 98 - 111 mmol/L   CO2 24 22 - 32 mmol/L   Glucose, Bld 72 70 - 99 mg/dL    Comment: Glucose reference range applies only to samples taken after fasting for at least 8 hours.   BUN 22 (H) 6 - 20 mg/dL   Creatinine, Ser 2.14 (H) 0.44 - 1.00 mg/dL   Calcium 7.5 (L) 8.9 - 10.3 mg/dL   Total Protein 5.2 (L) 6.5 - 8.1 g/dL   Albumin 1.5 (L) 3.5 - 5.0 g/dL   AST 13 (L) 15 - 41 U/L   ALT 8 0 - 44 U/L   Alkaline Phosphatase 105 38 - 126 U/L   Total Bilirubin 0.6 0.3 - 1.2 mg/dL   GFR, Estimated 31 (L) >60 mL/min    Comment: (NOTE) Calculated using the CKD-EPI Creatinine Equation (2021)    Anion gap 4 (L) 5 - 15    Comment: Performed at [facility], [place] [address] [place], [place] [address]  Phosphorus     Status: None   Collection Time: 12/05/20  3:45 AM  Result Value Ref Range   Phosphorus 3.2 2.5 - 4.6 mg/dL    Comment: Performed at [facility] [address], [place], [place] [address]  Triglycerides     Status: None   Collection Time: 12/05/20  3:51 AM  Result Value Ref Range   Triglycerides 49 <150 mg/dL    Comment: Performed at [facility] [address] [place], [place] [address]  Glucose, capillary     Status: Abnormal   Collection Time: 12/05/20  6:13 AM  Result Value Ref Range   Glucose-Capillary 44 (LL) 70 - 99 mg/dL    Comment: Glucose reference range applies only to samples taken after fasting for at least 8 hours.   Comment 1 Notify RN   Glucose, capillary     Status: Abnormal   Collection Time: 12/05/20  6:35  AM  Result Value Ref Range   Glucose-Capillary 123 (H) 70 - 99 mg/dL    Comment: Glucose reference range applies only to samples taken after fasting for at least 8 hours.  Glucose, capillary     Status: None   Collection Time: 12/05/20  7:16 AM  Result Value Ref Range   Glucose-Capillary 78 70 - 99 mg/dL    Comment: Glucose reference range applies only to samples taken after fasting for at least 8 hours.  Glucose, capillary     Status: None   Collection Time: 12/05/20 12:01 PM  Result Value Ref Range   Glucose-Capillary 75 70 - 99 mg/dL    Comment: Glucose reference range applies only to samples taken after fasting for at least 8 hours.  Glucose, capillary     Status: Abnormal   Collection Time: 12/05/20  3:14 PM  Result Value Ref Range   Glucose-Capillary 165 (H) 70 - 99 mg/dL    Comment: Glucose reference range applies only to samples taken after fasting for at least 8 hours.  Glucose, capillary     Status: Abnormal   Collection Time: 12/05/20  7:45 PM  Result Value Ref Range   Glucose-Capillary 241 (H) 70 - 99 mg/dL    Comment: Glucose reference range applies only to samples taken after fasting for at least 8 hours.  Glucose, capillary     Status: Abnormal   Collection Time: 12/05/20 11:47 PM  Result Value  Ref Range   Glucose-Capillary 202 (H) 70 - 99 mg/dL    Comment: Glucose reference range applies only to samples taken after fasting for at least 8 hours.  Glucose, capillary     Status: Abnormal   Collection Time: 12/06/20  3:31 AM  Result Value Ref Range   Glucose-Capillary 161 (H) 70 - 99 mg/dL    Comment: Glucose reference range applies only to samples taken after fasting for at least 8 hours.  Basic metabolic panel     Status: Abnormal   Collection Time: 12/06/20  6:30 AM  Result Value Ref Range   Sodium 137 135 - 145 mmol/L   Potassium 3.5 3.5 - 5.1 mmol/L   Chloride 109 98 - 111 mmol/L   CO2 23 22 - 32 mmol/L   Glucose, Bld 75 70 - 99 mg/dL    Comment: Glucose  reference range applies only to samples taken after fasting for at least 8 hours.   BUN 25 (H) 6 - 20 mg/dL   Creatinine, Ser 1.91 (H) 0.44 - 1.00 mg/dL   Calcium 7.7 (L) 8.9 - 10.3 mg/dL   GFR, Estimated 36 (L) >60 mL/min    Comment: (NOTE) Calculated using the CKD-EPI Creatinine Equation (2021)    Anion gap 5 5 - 15    Comment: Performed at [facility] [address], [place], [place] [address]  CBC     Status: Abnormal   Collection Time: 12/06/20  6:30 AM  Result Value Ref Range   WBC 7.4 4.0 - 10.5 K/uL   RBC 2.76 (L) 3.87 - 5.11 MIL/uL   Hemoglobin 8.0 (L) 12.0 - 15.0 g/dL   HCT 26.0 (L) 36.0 - 46.0 %   MCV 94.2 80.0 - 100.0 fL   MCH 29.0 26.0 - 34.0 pg   MCHC 30.8 30.0 - 36.0 g/dL   RDW 14.6 11.5 - 15.5 %   Platelets 165 150 - 400 K/uL   nRBC 0.0 0.0 - 0.2 %    Comment: Performed at [facility], [place] [address] [place], [place] [address]  Glucose, capillary     Status: Abnormal   Collection Time: 12/06/20  8:04 AM  Result Value Ref Range   Glucose-Capillary 109 (H) 70 - 99 mg/dL    Comment: Glucose reference range applies only to samples taken after fasting for at least 8 hours.    Recent Results (from the past 240 hour(s))  MRSA Next Gen by PCR, Nasal     Status: None   Collection Time: 12/03/20  1:56 PM   Specimen: Nasal Mucosa; Nasal Swab  Result Value Ref Range Status   MRSA by PCR Next Gen NOT DETECTED NOT DETECTED Final    Comment: (NOTE) The GeneXpert MRSA Assay (FDA approved for NASAL specimens only), is one component of a comprehensive MRSA colonization surveillance program. It is not intended to diagnose MRSA infection nor to guide or monitor treatment for MRSA infections. Test performance is not FDA approved in patients less than 28 years old. Performed at [facility], [place] [address], [place], [place] [address]   SARS CORONAVIRUS 2 (TAT 6-24 HRS) Nasopharyngeal Nasopharyngeal Swab     Status: Abnormal   Collection Time: 12/04/20   3:03 PM   Specimen: Nasopharyngeal Swab  Result Value Ref Range Status   SARS Coronavirus 2 POSITIVE (A) NEGATIVE Final    Comment: (NOTE) SARS-CoV-2 target nucleic acids are DETECTED.  The SARS-CoV-2 RNA is generally detectable in upper and  lower respiratory specimens during the acute phase of infection. Positive results are indicative of the presence of SARS-CoV-2 RNA. Clinical correlation with patient history and other diagnostic information is  necessary to determine patient infection status. Positive results do not rule out bacterial infection or co-infection with other viruses.  The expected result is Negative.  Fact Sheet for Patients: SugarRoll.be  Fact Sheet for Healthcare Providers: https://www.woods-mathews.com/  This test is not yet approved or cleared by the [place] FDA and  has been authorized for detection and/or diagnosis of SARS-CoV-2 by FDA under an Emergency Use Authorization (EUA). This EUA will remain  in effect (meaning this test can be used) for the duration of the COVID-19 declaration under Section 564(b)(1) of the Act, 21 U. S.C. section 360bbb-3(b)(1), unless the authorization is terminated or revoked sooner.   Performed at [facility], [place] [address] [place], [place] [address]     Lipid Panel Recent Labs    12/05/20 0351  TRIG 49    Studies/Results: US RENAL  Result Date: 12/05/2020 CLINICAL DATA:  Acute kidney injury. EXAM: RENAL / URINARY TRACT ULTRASOUND COMPLETE COMPARISON:  Noncontrast CT 12/03/2020.  Renal ultrasound 02/16/2020 FINDINGS: Right Kidney: Renal measurements: 12.2 x 6.0 x 5.5 cm = volume: 207 mL. Diffusely increased renal parenchymal echogenicity. No hydronephrosis. No evidence of focal lesion or stone. Left Kidney: Renal measurements: 12.7 x 5.2 x 4.6 cm = volume: 158 mL. Diffusely increased renal parenchymal echogenicity. No hydronephrosis. No evidence of focal lesion or  stone. Bladder: Decompressed by Foley catheter. Bladder wall may be thickened despite nondistention. Other: Small volume abdominopelvic ascites, as seen on recent CT. IMPRESSION: 1. No hydronephrosis or obstructive uropathy. 2. Mild increased renal parenchymal echogenicity consistent with chronic medical renal disease. 3. Question bladder wall thickening versus nondistention. 4. Small volume ascites as seen on recent CT. Electronically Signed   By: Keith Rake M.D.   On: 12/05/2020 23:27    Medications: Scheduled:  (feeding supplement) PROSource Plus  30 mL Oral BID BM   amLODipine  10 mg Oral Daily   busPIRone  5 mg Oral TID   carvedilol  12.5 mg Oral BID WC   Chlorhexidine Gluconate Cloth  6 each Topical Daily   feeding supplement  1 Container Oral BID BM   gabapentin  200 mg Oral TID   heparin  5,000 Units Subcutaneous Q8H   hydrALAZINE  50 mg Oral Q8H   insulin aspart  0-6 Units Subcutaneous Q4H   insulin detemir  3 Units Subcutaneous Daily   LORazepam  0.5 mg Oral BID   metoCLOPramide  5 mg Oral TID AC   potassium chloride  20 mEq Oral Once   sodium chloride flush  10-40 mL Intracatheter Q12H   Continuous:  sodium chloride 10 mL/hr at 12/06/20 0900   levETIRAcetam Stopped (12/06/20 0848)   thiamine injection Stopped (12/05/20 2159)    Assessment: 30 year old female presenting with new onset of GTC seizure activity, preceded by AMS. Markedly elevated BP on arrival to the ED.  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  1. Now back to baseline based on symptoms and exam.  2. EEGs without electrographic seizure activity.   Recommendations: 1. Continue Keppra scheduled dosing at 500 mg q12h. At outpatient follow up, can consider tapering off gradually 2. Long term BP goal 120/80 3. Will need repeat MRI brain prior to discharge to assess for possible improvement.  4. Now that she is back to baseline, will need  to discuss outpatient seizure precautions with the patient:  Per [facility] statutes, patients with seizures are not allowed to drive until  they have been seizure-free for six months. Use caution when using heavy equipment or power tools. Avoid working on ladders or at heights. Take showers instead of baths. Ensure the water temperature is not too high on the home water heater. Do not go swimming alone. When caring for infants or small children, sit down when holding, feeding, or changing them to minimize risk of injury to the child in the event you have a seizure. Also, Maintain good sleep hygiene. Avoid alcohol. 5. Neurohospitalist service will follow PRN. Please call us after repeat MRI is obtained (obtain on Sunday).  6. Outpatient Neurology follow up.     LOS: 3 days   @Electronically  signed: Dr. Kerney Elbe 12/06/2020  10:05 AM

## 2020-12-06 NOTE — Progress Notes (Signed)
[name] Progress Note Patient Name: [name] DOB: [birth year] MRN: [id number]   Date of Service  12/06/2020  HPI/Events of Note  Patient with nausea and vomiting not resolved by PRN Zofran.  eICU Interventions  PRN Phenergan ordered for intractable nausea.        Kerry Kass Verl [place] 12/06/2020, 10:39 PM

## 2020-12-06 NOTE — Progress Notes (Signed)
A consult was placed to IV therapy to place a new piv in order to remove the pt's picc line; left arm assessed thoroughly w ultrasound; veins split and bifurcate;  attempted x 1; unable to thread catheter;  pt requesting to keep the picc line , saying "I'm such a hard stick."   RN aware.  No further attempts made.

## 2020-12-07 DIAGNOSIS — R569 Unspecified convulsions: Secondary | ICD-10-CM | POA: Diagnosis not present

## 2020-12-07 DIAGNOSIS — R451 Restlessness and agitation: Secondary | ICD-10-CM | POA: Diagnosis not present

## 2020-12-07 DIAGNOSIS — R410 Disorientation, unspecified: Secondary | ICD-10-CM | POA: Diagnosis not present

## 2020-12-07 DIAGNOSIS — U071 COVID-19: Secondary | ICD-10-CM

## 2020-12-07 DIAGNOSIS — N179 Acute kidney failure, unspecified: Secondary | ICD-10-CM | POA: Diagnosis not present

## 2020-12-07 LAB — BASIC METABOLIC PANEL
Anion gap: 9 (ref 5–15)
BUN: 24 mg/dL — ABNORMAL HIGH (ref 6–20)
CO2: 21 mmol/L — ABNORMAL LOW (ref 22–32)
Calcium: 8.4 mg/dL — ABNORMAL LOW (ref 8.9–10.3)
Chloride: 106 mmol/L (ref 98–111)
Creatinine, Ser: 1.66 mg/dL — ABNORMAL HIGH (ref 0.44–1.00)
GFR, Estimated: 42 mL/min — ABNORMAL LOW (ref >60)
Glucose, Bld: 163 mg/dL — ABNORMAL HIGH (ref 70–99)
Potassium: 4.1 mmol/L (ref 3.5–5.1)
Sodium: 136 mmol/L (ref 135–145)

## 2020-12-07 LAB — CBC WITH DIFFERENTIAL/PLATELET
Abs Immature Granulocytes: 0.02 10*3/uL (ref 0.00–0.07)
Basophils Absolute: 0.1 10*3/uL (ref 0.0–0.1)
Basophils Relative: 1 %
Eosinophils Absolute: 0.5 10*3/uL (ref 0.0–0.5)
Eosinophils Relative: 7 %
HCT: 24.8 % — ABNORMAL LOW (ref 36.0–46.0)
Hemoglobin: 7.9 g/dL — ABNORMAL LOW (ref 12.0–15.0)
Immature Granulocytes: 0 %
Lymphocytes Relative: 18 %
Lymphs Abs: 1.2 10*3/uL (ref 0.7–4.0)
MCH: 29.3 pg (ref 26.0–34.0)
MCHC: 31.9 g/dL (ref 30.0–36.0)
MCV: 91.9 fL (ref 80.0–100.0)
Monocytes Absolute: 0.5 10*3/uL (ref 0.1–1.0)
Monocytes Relative: 7 %
Neutro Abs: 4.3 10*3/uL (ref 1.7–7.7)
Neutrophils Relative %: 67 %
Platelets: 171 10*3/uL (ref 150–400)
RBC: 2.7 MIL/uL — ABNORMAL LOW (ref 3.87–5.11)
RDW: 14 % (ref 11.5–15.5)
WBC: 6.5 10*3/uL (ref 4.0–10.5)
nRBC: 0 % (ref 0.0–0.2)

## 2020-12-07 LAB — GLUCOSE, CAPILLARY
Glucose-Capillary: 160 mg/dL — ABNORMAL HIGH (ref 70–99)
Glucose-Capillary: 139 mg/dL — ABNORMAL HIGH (ref 70–99)
Glucose-Capillary: 217 mg/dL — ABNORMAL HIGH (ref 70–99)
Glucose-Capillary: 190 mg/dL — ABNORMAL HIGH (ref 70–99)
Glucose-Capillary: 168 mg/dL — ABNORMAL HIGH (ref 70–99)
Glucose-Capillary: 204 mg/dL — ABNORMAL HIGH (ref 70–99)

## 2020-12-07 LAB — MAGNESIUM: Magnesium: 1.8 mg/dL (ref 1.7–2.4)

## 2020-12-07 MED ORDER — INSULIN ASPART 100 UNIT/ML IJ SOLN
0.0000 [IU] | Freq: Three times a day (TID) | INTRAMUSCULAR | Status: DC
  Administered 2020-12-07: 2 [IU] via SUBCUTANEOUS
  Administered 2020-12-07: 3 [IU] via SUBCUTANEOUS
  Administered 2020-12-08: 1 [IU] via SUBCUTANEOUS
  Administered 2020-12-08: 5 [IU] via SUBCUTANEOUS
  Administered 2020-12-09: 2 [IU] via SUBCUTANEOUS
  Administered 2020-12-09: 5 [IU] via SUBCUTANEOUS
  Administered 2020-12-10: 1 [IU] via SUBCUTANEOUS
  Administered 2020-12-10: 5 [IU] via SUBCUTANEOUS

## 2020-12-07 MED ORDER — INSULIN ASPART 100 UNIT/ML IJ SOLN
0.0000 [IU] | Freq: Every day | INTRAMUSCULAR | Status: DC
Start: 2020-12-07 — End: 2020-12-11
  Administered 2020-12-07: 2 [IU] via SUBCUTANEOUS

## 2020-12-07 MED ORDER — INSULIN GLARGINE-YFGN 100 UNIT/ML ~~LOC~~ SOLN
3.0000 [IU] | Freq: Every day | SUBCUTANEOUS | Status: DC
  Administered 2020-12-08 – 2020-12-10 (×3): 3 [IU] via SUBCUTANEOUS
  Filled 2020-12-07 (×4): qty 0.03

## 2020-12-07 MED ORDER — INSULIN ASPART 100 UNIT/ML IJ SOLN
2.0000 [IU] | Freq: Three times a day (TID) | INTRAMUSCULAR | Status: DC
  Administered 2020-12-07 – 2020-12-10 (×8): 2 [IU] via SUBCUTANEOUS

## 2020-12-07 MED ORDER — POLYETHYLENE GLYCOL 3350 17 G PO PACK: 17.0000 g | PACK | Freq: Every day | ORAL | Status: DC | PRN

## 2020-12-07 MED ORDER — DOCUSATE SODIUM 100 MG PO CAPS: 100.0000 mg | ORAL_CAPSULE | Freq: Two times a day (BID) | ORAL | Status: DC | PRN

## 2020-12-07 MED ORDER — HYDRALAZINE HCL 50 MG PO TABS
100.0000 mg | ORAL_TABLET | Freq: Three times a day (TID) | ORAL | Status: DC
  Administered 2020-12-07 – 2020-12-10 (×9): 100 mg via ORAL
  Filled 2020-12-07 (×9): qty 2

## 2020-12-07 NOTE — Progress Notes (Signed)
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  She was brought to the ED and had some flexor posturing prior to arrival and was intubated the ED for combativeness.  Has chronic nausea vomiting in the setting of gastroparesis and a normal gastric emptying study 2021.  She started having some intractable nausea vomiting and woke up and husband felt that she seemed out of it and did not really acknowledge him.  She also did not recognize a photo of the daughter.  She came to the emergency room and at the door she is on done and was described to have some flexor posturing of her extremities last about 60 seconds.  Thereafter she started becoming hysterical and started screaming.  She is very combative and was intubated.  Labs are notable to be elevated with an anion gap of 18 and normal lactate.  She is hypokalemic.  She had an elevated creatinine but has chronic kidney disease.  CT of the abdomen pelvis was done with anasarca or ascites.  CT of the chest revealed bibasilar atelectasis and faint groundglass opacities.  Neurology was consulted and she was transferred to [facility].  She ended up getting a stat MRI and spot EEG.  MRI was consistent with press and EEG was consistent with excessive beta and continuous slow with severe encephalopathy likely related to sedation.  Her blood pressures been elevated and she had as needed labetalol added.  Blood pressure is improving and she is improved from a  neurological standpoint.  Her seizure-like activity witnessed in the ED and neurology following.  They are recommending continue home Ativan and AEDs with Keppra 500 mg every 12 and tapering off gradually.  She was transferred to the hospital service on 12/07/2020   Assessment & Plan:   Active Problems:   AKI (acute kidney injury) ([place])   Type 1 diabetes mellitus with ketoacidosis without coma (HCC)   Respiratory failure (HCC)   Seizure ([place])   Delirium   Hypertensive emergency   PRES (posterior reversible encephalopathy syndrome)   Malnutrition of moderate degree   Agitation  Acute Metabolic Encephalopathy, back to baseline  Seizure Like Activity  -Improved and she appears at baseline -Likely was multifactorial -She is hypotensive in the ED with MRI suggestive of press -She also has a history of substance abuse -Currently her GCS score is 15 and she did have seizure-like activity and platelets in the ED -Neurology is following and appreciating their assistance and they are recommending Keppra 500 mg every 12 -Continue seizure precautions and Seizure Recommendations given -Per neurology the goal is for a systolic blood pressure less than 140 and avoiding hypotension  Hypertension -Has been poorly controlled -Has been on 3 different medications at home -Current goal is for systolic blood pressure 497 140 with long-term goal being 120/80 -Continue home amlodipine and carvedilol -Hydralazine has been restarted 50 mg every 8 and is being increased today to 100 mg p.o. every 8 -Continue monitor blood pressure for protocol -Last blood pressure reading was  Diabetes mellitus type 1 -Inpatient blood glucose  goal is 140-180  -Attending sliding scale and stop D10 given that she is now on a diet -Started on Levemir yesterday and changed to sliding scale insulin AC at bedtime and added 2 units of NovoLog 3 times daily with meals -Diabetes education coronary consulted and follow -CBGs  ranging from 139-277  AKI on CKD Stage 3b Metabolic acidosis -She presented with a creatinine of 1.45 and trended elevated from 2.14 but is now improving and BUN/creatinine went from 25/1.91 and trended down to 24/1.66 -She is initially hypertensive and blood pressure dropped may have caused worsening renal function -Renal ultrasound was done and was negative for hydronephrosis or obstructive nephropathy -Continue MAP of greater than 65 -Avoid nephrotoxic medications, contrast dyes, hypotension renally dose medication -Strict I's and O's and urine output and continue to monitor and trend renal function carefully and repeat CMP in a.m.  Anemia chronic kidney disease -Stable -Patient hemoglobin/hematocrit is 7.9/24.8 -Check anemia panel and -Continue to monitor for signs and symptoms of bleeding; currently no overt bleeding noted -Repeat CBC in a.m.  Diarrhea -Has had diarrhea for baseline -States that she wears diapers and pads at home for this -Flexi-Seal was placed but now removed -We will need outpatient follow-up with PCP and supportive care  Nausea vomiting -In the setting of gastroparesis however has a notable normal gastric emptying study in 2021 -Continue monitor QTC and continue with Reglan and as needed Zofran antiemetics  Malnutrition of Moderate Degree -Nutritionist consulted for further evaluation recommendations recommending boost breeze p.o. twice daily as well as Prosource plus 30 mL p.o. twice daily  COVID-positive -Airborne and contact precautions -Incidental finding -Check inflammatory markers in the a.m. and consider starting remdesivir given that she is asymptomatic  DVT prophylaxis: Heparin 5,000 units sq q8h Code Status: FULL CODE Family Communication: No family present at bedside Disposition Plan: Pending further clinical improvement and she has been transferred to the stepdown unit out of the ICU however currently no bed available  Status is:  Inpatient  Remains inpatient appropriate because:Unsafe d/c plan, IV treatments appropriate due to intensity of illness or inability to take PO, and Inpatient level of care appropriate due to severity of illness  Dispo: The patient is from: Home              Anticipated d/c is to: Home              Patient currently is not medically stable to d/c.   Difficult to place patient No  Consultants:  Neurology PCCM Transfer   Procedures:  EEG IMPRESSION: This study was initially suggestive of severe diffuse encephalopathy, nonspecific etiology but likely related to sedation.  As sedation was weaned, EEG improved and was suggestive of moderate diffuse encephalopathy.  No seizures or epileptiform discharges were seen throughout the recording.  Antimicrobials:  Anti-infectives (From admission, onward)    None        Subjective: Seen and examined at bedside and was doing well and had no complaints.  Felt back to her baseline.  Did not know what happened when she came in.  Denies any complaints at this time and no pain.  Sitting there using a coloring book and watching her iPad.  No other concerns or complaint at this time.  Objective: Vitals:   12/07/20 1400 12/07/20 1500 12/07/20 1546 12/07/20 1600  BP: 139/87 119/69  130/85  Pulse: 80 80  79  Resp: 16 11  13   Temp:   98.2 F (36.8 C)   TempSrc:  Oral   SpO2: 97% 98%  100%  Weight:      Height:        Intake/Output Summary (Last 24 hours) at 12/07/2020 1636 Last data filed at 12/07/2020 1300 Gross per 24 hour  Intake 183.08 ml  Output 801 ml  Net -617.92 ml   Filed Weights   12/03/20 2000 12/05/20 0500 12/07/20 0500  Weight: 70.4 kg 74.1 kg 75.2 kg   Examination: Physical Exam:  Constitutional: Chronically ill-appearing African-American female currently in no acute distress appears calm Eyes: Lids and conjunctivae normal, sclerae anicteric  ENMT: External Ears, Nose appear normal.  Neck: Appears normal, supple, no  cervical masses, normal ROM, no appreciable thyromegaly; no appreciable JVD Respiratory: Diminished to auscultation bilaterally, no wheezing, rales, rhonchi or crackles. Normal respiratory effort and patient is not tachypenic. No accessory muscle use.  Unlabored breathing Cardiovascular: RRR, no murmurs / rubs / gallops. S1 and S2 auscultated.  No appreciable extremity edema Abdomen: Soft, non-tender, non-distended.  Bowel sounds positive.  GU: Deferred. Musculoskeletal: No clubbing / cyanosis of digits/nails.  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 No induration; Warm and dry.  Neurologic: CN 2-12 grossly intact with no focal deficits. Romberg sign and cerebellar reflexes not assessed.  Psychiatric: Normal judgment and insight. Alert and oriented x 3. Normal mood and appropriate affect.   Data Reviewed: I have personally reviewed following labs and imaging studies  CBC: Recent Labs  Lab 12/03/20 0516 12/03/20 0847 12/04/20 0440 12/04/20 1315 12/05/20 0345 12/06/20 0630 12/07/20 1003  WBC 10.1 7.4 7.7  --  7.3 7.4 6.5  NEUTROABS 7.4  --   --   --   --   --  4.3  HGB 10.9* 9.2* 7.5* 8.1* 7.8* 8.0* 7.9*  HCT 34.4* 28.2* 23.6* 25.6* 25.0* 26.0* 24.8*  MCV 92.0 90.1 90.4  --  92.9 94.2 91.9  PLT 309 196 168  --  168 165 885   Basic Metabolic Panel: Recent Labs  Lab 12/04/20 0021 12/04/20 0440 12/05/20 0345 12/06/20 0630 12/07/20 0520  NA 138 138 137 137 136  K 2.9* 3.2* 4.3 3.5 4.1  CL 109 109 109 109 106  CO2 24 24 24 23  21*  GLUCOSE 103* 90 72 75 163*  BUN 23* 23* 22* 25* 24*  CREATININE 1.54* 1.74* 2.14* 1.91* 1.66*  CALCIUM 7.4* 7.4* 7.5* 7.7* 8.4*  MG  --  1.3* 2.1  --  1.8  PHOS  --   --  3.2  --   --    GFR: Estimated Creatinine Clearance: 51.9 mL/min (A) (by C-G formula based on SCr of 1.66 mg/dL (H)). Liver Function Tests: Recent Labs  Lab 12/03/20 0516 12/05/20 0345  AST  27 13*  ALT 11 8  ALKPHOS 155* 105  BILITOT 1.1 0.6  PROT 7.4 5.2*  ALBUMIN 2.5* 1.5*   No results for input(s): LIPASE, AMYLASE in the last 168 hours. No results for input(s): AMMONIA in the last 168 hours. Coagulation Profile: Recent Labs  Lab 12/03/20 0847  INR 1.3*   Cardiac Enzymes: Recent Labs  Lab 12/03/20 0516  CKTOTAL 93   BNP (last 3 results) No results for input(s): PROBNP in the last 8760 hours. HbA1C: No results for input(s): HGBA1C in the last 72 hours. CBG: Recent Labs  Lab 12/06/20 1931 12/06/20 2315 12/07/20 0340 12/07/20 0756 12/07/20 1134  GLUCAP 224* 166* 160* 139* 217*  Lipid Profile: Recent Labs    12/05/20 0351  TRIG 49   Thyroid Function Tests: No results for input(s): TSH, T4TOTAL, FREET4, T3FREE, THYROIDAB in the last 72 hours. Anemia Panel: No results for input(s): VITAMINB12, FOLATE, FERRITIN, TIBC, IRON, RETICCTPCT in the last 72 hours. Sepsis Labs: Recent Labs  Lab 12/03/20 0630  LATICACIDVEN 1.0    Recent Results (from the past 240 hour(s))  MRSA Next Gen by PCR, Nasal     Status: None   Collection Time: 12/03/20  1:56 PM   Specimen: Nasal Mucosa; Nasal Swab  Result Value Ref Range Status   MRSA by PCR Next Gen NOT DETECTED NOT DETECTED Final    Comment: (NOTE) The GeneXpert MRSA Assay (FDA approved for NASAL specimens only), is one component of a comprehensive MRSA colonization surveillance program. It is not intended to diagnose MRSA infection nor to guide or monitor treatment for MRSA infections. Test performance is not FDA approved in patients less than 33 years old. Performed at [facility], [place] [address], [place], [place] [address]   SARS CORONAVIRUS 2 (TAT 6-24 HRS) Nasopharyngeal Nasopharyngeal Swab     Status: Abnormal   Collection Time: 12/04/20  3:03 PM   Specimen: Nasopharyngeal Swab  Result Value Ref Range Status   SARS Coronavirus 2 POSITIVE (A) NEGATIVE Final    Comment: (NOTE) SARS-CoV-2  target nucleic acids are DETECTED.  The SARS-CoV-2 RNA is generally detectable in upper and lower respiratory specimens during the acute phase of infection. Positive results are indicative of the presence of SARS-CoV-2 RNA. Clinical correlation with patient history and other diagnostic information is  necessary to determine patient infection status. Positive results do not rule out bacterial infection or co-infection with other viruses.  The expected result is Negative.  Fact Sheet for Patients: SugarRoll.be  Fact Sheet for Healthcare Providers: https://www.woods-mathews.com/  This test is not yet approved or cleared by the [place] FDA and  has been authorized for detection and/or diagnosis of SARS-CoV-2 by FDA under an Emergency Use Authorization (EUA). This EUA will remain  in effect (meaning this test can be used) for the duration of the COVID-19 declaration under Section 564(b)(1) of the Act, 21 U. S.C. section 360bbb-3(b)(1), unless the authorization is terminated or revoked sooner.   Performed at [facility], [place] [address], [place], [place] [address]      RN Pressure Injury Documentation:     Estimated body mass index is 25.97 kg/m as calculated from the following:   Height as of this encounter: 5' 7"  (1.702 m).   Weight as of this encounter: 75.2 kg.  Malnutrition Type:  Nutrition Problem: Moderate Malnutrition Etiology: chronic illness (gastroparesis, chronic nausea/vomiting, uncontrolled T1DM)  Malnutrition Characteristics:  Signs/Symptoms: mild fat depletion, moderate muscle depletion  Nutrition Interventions:  Interventions: Boost Breeze, Prostat   Radiology Studies: US RENAL  Result Date: 12/05/2020 CLINICAL DATA:  Acute kidney injury. EXAM: RENAL / URINARY TRACT ULTRASOUND COMPLETE COMPARISON:  Noncontrast CT 12/03/2020.  Renal ultrasound 02/16/2020 FINDINGS: Right Kidney: Renal measurements: 12.2  x 6.0 x 5.5 cm = volume: 207 mL. Diffusely increased renal parenchymal echogenicity. No hydronephrosis. No evidence of focal lesion or stone. Left Kidney: Renal measurements: 12.7 x 5.2 x 4.6 cm = volume: 158 mL. Diffusely increased renal parenchymal echogenicity. No hydronephrosis. No evidence of focal lesion or stone. Bladder: Decompressed by Foley catheter. Bladder wall may be thickened despite nondistention. Other: Small volume abdominopelvic ascites, as seen on recent CT. IMPRESSION: 1. No  hydronephrosis or obstructive uropathy. 2. Mild increased renal parenchymal echogenicity consistent with chronic medical renal disease. 3. Question bladder wall thickening versus nondistention. 4. Small volume ascites as seen on recent CT. Electronically Signed   By: Keith Rake M.D.   On: 12/05/2020 23:27    Scheduled Meds:  (feeding supplement) PROSource Plus  30 mL Oral BID BM   amLODipine  10 mg Oral Daily   busPIRone  5 mg Oral TID   carvedilol  12.5 mg Oral BID WC   Chlorhexidine Gluconate Cloth  6 each Topical Daily   feeding supplement  1 Container Oral BID BM   gabapentin  200 mg Oral TID   heparin  5,000 Units Subcutaneous Q8H   hydrALAZINE  100 mg Oral Q8H   insulin aspart  0-5 Units Subcutaneous QHS   insulin aspart  0-9 Units Subcutaneous TID WC   insulin aspart  2 Units Subcutaneous TID WC   [START ON 12/08/2020] insulin glargine-yfgn  3 Units Subcutaneous Daily   levETIRAcetam  500 mg Oral BID   LORazepam  0.5 mg Oral BID   metoCLOPramide  5 mg Oral TID AC   sodium chloride flush  10-40 mL Intracatheter Q12H   Continuous Infusions:  sodium chloride Stopped (12/07/20 1228)   promethazine (PHENERGAN) injection (IM or IVPB) Stopped (12/06/20 2332)    LOS: 4 days   Kerney Elbe, DO [facility] PAGER is on AMION  If 7PM-7AM, please contact night-coverage [email or web address]

## 2020-12-07 NOTE — Progress Notes (Signed)
RN attempted give report x2. No response. Will attempt to give report again shortly when nurse is available.

## 2020-12-07 NOTE — Progress Notes (Signed)
Inpatient Diabetes Program Recommendations  AACE/ADA: New Consensus Statement on Inpatient Glycemic Control (2015)  Target Ranges:  Prepandial:   less than 140 mg/dL      Peak postprandial:   less than 180 mg/dL (1-2 hours)      Critically ill patients:  140 - 180 mg/dL   Lab Results  Component Value Date   GLUCAP 217 (H) 12/07/2020   HGBA1C 7.2 (H) 11/13/2020    Review of Glycemic Control Results for [name], [name] (MRN [id number]) as of 12/07/2020 11:38  Ref. Range 12/06/2020 23:15 12/07/2020 03:40 12/07/2020 07:56 12/07/2020 11:34  Glucose-Capillary Latest Ref Range: 70 - 99 mg/dL 166 (H) 160 (H) 139 (H) 217 (H)   Diabetes history: Type 1 DM Outpatient Diabetes medications: Lantus 8 units QHS, Humalog 0-5 units TID Current orders for Inpatient glycemic control: Novolog 0-6 units Q4H D10% @ 30m/hr   Inpatient Diabetes Program Recommendations:    Consider adding Novolog 2 units TID (assuming patient is consuming >50% of meals) and changing correction to TID & HS.   Thanks, LBronson Curb MSN, RNC-OB Diabetes Coordinator [phone](8a-5p)

## 2020-12-08 ENCOUNTER — Inpatient Hospital Stay (HOSPITAL_COMMUNITY): Payer: Medicaid Other

## 2020-12-08 DIAGNOSIS — R569 Unspecified convulsions: Secondary | ICD-10-CM | POA: Diagnosis not present

## 2020-12-08 DIAGNOSIS — N179 Acute kidney failure, unspecified: Secondary | ICD-10-CM | POA: Diagnosis not present

## 2020-12-08 DIAGNOSIS — R410 Disorientation, unspecified: Secondary | ICD-10-CM | POA: Diagnosis not present

## 2020-12-08 DIAGNOSIS — R451 Restlessness and agitation: Secondary | ICD-10-CM | POA: Diagnosis not present

## 2020-12-08 LAB — COMPREHENSIVE METABOLIC PANEL
ALT: 10 U/L (ref 0–44)
AST: 22 U/L (ref 15–41)
Albumin: 1.7 g/dL — ABNORMAL LOW (ref 3.5–5.0)
Alkaline Phosphatase: 94 U/L (ref 38–126)
Anion gap: 4 — ABNORMAL LOW (ref 5–15)
BUN: 28 mg/dL — ABNORMAL HIGH (ref 6–20)
CO2: 22 mmol/L (ref 22–32)
Calcium: 7.9 mg/dL — ABNORMAL LOW (ref 8.9–10.3)
Chloride: 107 mmol/L (ref 98–111)
Creatinine, Ser: 1.64 mg/dL — ABNORMAL HIGH (ref 0.44–1.00)
GFR, Estimated: 43 mL/min — ABNORMAL LOW (ref >60)
Glucose, Bld: 233 mg/dL — ABNORMAL HIGH (ref 70–99)
Potassium: 4.2 mmol/L (ref 3.5–5.1)
Sodium: 133 mmol/L — ABNORMAL LOW (ref 135–145)
Total Bilirubin: 0.5 mg/dL (ref 0.3–1.2)
Total Protein: 5.5 g/dL — ABNORMAL LOW (ref 6.5–8.1)

## 2020-12-08 LAB — FERRITIN: Ferritin: 470 ng/mL — ABNORMAL HIGH (ref 11–307)

## 2020-12-08 LAB — CBC WITH DIFFERENTIAL/PLATELET
Abs Immature Granulocytes: 0.03 10*3/uL (ref 0.00–0.07)
Basophils Absolute: 0.1 10*3/uL (ref 0.0–0.1)
Basophils Relative: 1 %
Eosinophils Absolute: 0.5 10*3/uL (ref 0.0–0.5)
Eosinophils Relative: 7 %
HCT: 23.9 % — ABNORMAL LOW (ref 36.0–46.0)
Hemoglobin: 7.6 g/dL — ABNORMAL LOW (ref 12.0–15.0)
Immature Granulocytes: 0 %
Lymphocytes Relative: 13 %
Lymphs Abs: 1.1 10*3/uL (ref 0.7–4.0)
MCH: 29.2 pg (ref 26.0–34.0)
MCHC: 31.8 g/dL (ref 30.0–36.0)
MCV: 91.9 fL (ref 80.0–100.0)
Monocytes Absolute: 0.6 10*3/uL (ref 0.1–1.0)
Monocytes Relative: 8 %
Neutro Abs: 5.7 10*3/uL (ref 1.7–7.7)
Neutrophils Relative %: 71 %
Platelets: 177 10*3/uL (ref 150–400)
RBC: 2.6 MIL/uL — ABNORMAL LOW (ref 3.87–5.11)
RDW: 14.1 % (ref 11.5–15.5)
WBC: 8 10*3/uL (ref 4.0–10.5)
nRBC: 0 % (ref 0.0–0.2)

## 2020-12-08 LAB — C-REACTIVE PROTEIN: CRP: 0.6 mg/dL (ref <1.0)

## 2020-12-08 LAB — MAGNESIUM: Magnesium: 1.6 mg/dL — ABNORMAL LOW (ref 1.7–2.4)

## 2020-12-08 LAB — GLUCOSE, CAPILLARY
Glucose-Capillary: 224 mg/dL — ABNORMAL HIGH (ref 70–99)
Glucose-Capillary: 132 mg/dL — ABNORMAL HIGH (ref 70–99)
Glucose-Capillary: 93 mg/dL (ref 70–99)
Glucose-Capillary: 112 mg/dL — ABNORMAL HIGH (ref 70–99)

## 2020-12-08 LAB — D-DIMER, QUANTITATIVE: D-Dimer, Quant: 2.44 ug/mL-FEU — ABNORMAL HIGH (ref 0.00–0.50)

## 2020-12-08 LAB — FIBRINOGEN: Fibrinogen: 539 mg/dL — ABNORMAL HIGH (ref 210–475)

## 2020-12-08 LAB — PHOSPHORUS: Phosphorus: 4.9 mg/dL — ABNORMAL HIGH (ref 2.5–4.6)

## 2020-12-08 LAB — LACTATE DEHYDROGENASE: LDH: 217 U/L — ABNORMAL HIGH (ref 98–192)

## 2020-12-08 LAB — SEDIMENTATION RATE: Sed Rate: 92 mm/hr — ABNORMAL HIGH (ref 0–22)

## 2020-12-08 IMAGING — DX DG CHEST 1V PORT
1 series · 1 of 1 positions shown · non-contrast
Comparison: [DATE]

CLINICAL DATA: Back pain. Altered mental status. [67] positive.

EXAM:
PORTABLE CHEST 1 VIEW

[chest ap]
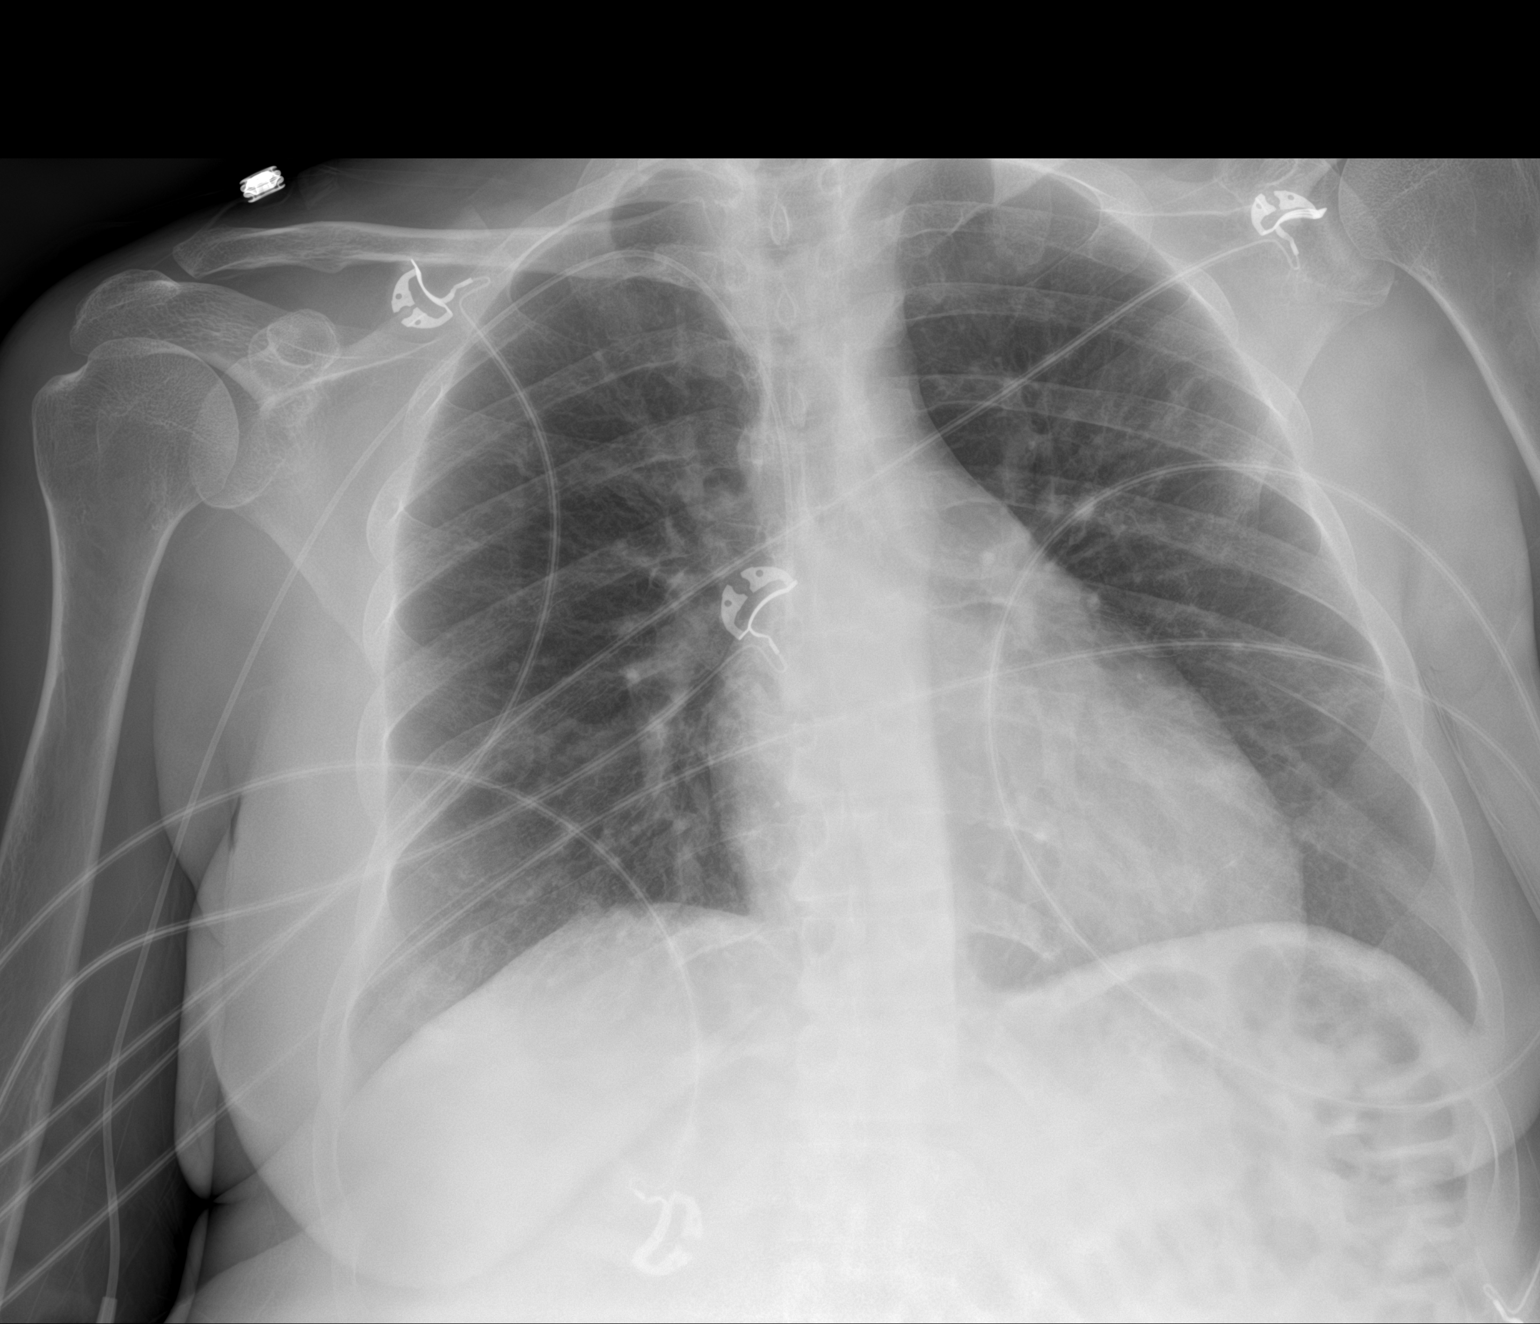

[1 of 1 positions shown; findings below may reference images not displayed]

FINDINGS: Right-sided PICC line tip at superior caval/atrial junction.
Interval extubation. Midline trachea. Borderline cardiomegaly. No
pleural effusion or pneumothorax. Left greater than right
interstitial coarsening without well-defined lobar consolidation.
IMPRESSION: Bilateral interstitial coarsening is favored to be chronic.
Especially in the left upper lobe, mild [67] pneumonia could
look similar.

## 2020-12-08 MED ORDER — MAGNESIUM SULFATE 2 GM/50ML IV SOLN
2.0000 g | Freq: Once | INTRAVENOUS | Status: AC
  Administered 2020-12-08: 2 g via INTRAVENOUS
  Filled 2020-12-08: qty 50

## 2020-12-08 MED ORDER — LOPERAMIDE HCL 2 MG PO CAPS
2.0000 mg | ORAL_CAPSULE | ORAL | Status: DC | PRN
  Administered 2020-12-08 – 2020-12-09 (×3): 2 mg via ORAL
  Filled 2020-12-08 (×3): qty 1

## 2020-12-08 MED ORDER — IPRATROPIUM-ALBUTEROL 20-100 MCG/ACT IN AERS
1.0000 | INHALATION_SPRAY | Freq: Four times a day (QID) | RESPIRATORY_TRACT | Status: DC | PRN
  Filled 2020-12-08: qty 4

## 2020-12-08 MED ORDER — ALUM & MAG HYDROXIDE-SIMETH 200-200-20 MG/5ML PO SUSP
30.0000 mL | Freq: Once | ORAL | Status: AC
  Administered 2020-12-08: 30 mL via ORAL
  Filled 2020-12-08: qty 30

## 2020-12-08 MED ORDER — ZINC SULFATE 220 (50 ZN) MG PO CAPS
220.0000 mg | ORAL_CAPSULE | Freq: Every day | ORAL | Status: DC
  Administered 2020-12-08 – 2020-12-09 (×2): 220 mg via ORAL
  Filled 2020-12-08 (×2): qty 1

## 2020-12-08 MED ORDER — ASCORBIC ACID 500 MG PO TABS
500.0000 mg | ORAL_TABLET | Freq: Every day | ORAL | Status: DC
  Administered 2020-12-08 – 2020-12-09 (×2): 500 mg via ORAL
  Filled 2020-12-08 (×2): qty 1

## 2020-12-08 MED ORDER — GUAIFENESIN ER 600 MG PO TB12
1200.0000 mg | ORAL_TABLET | Freq: Two times a day (BID) | ORAL | Status: DC
  Administered 2020-12-08 – 2020-12-10 (×4): 1200 mg via ORAL
  Filled 2020-12-08 (×4): qty 2

## 2020-12-08 MED ORDER — ALTEPLASE 2 MG IJ SOLR
2.0000 mg | Freq: Once | INTRAMUSCULAR | Status: AC
  Administered 2020-12-08: 2 mg
  Filled 2020-12-08: qty 2

## 2020-12-08 MED ORDER — NIRMATRELVIR/RITONAVIR (PAXLOVID) TABLET (RENAL DOSING)
2.0000 | ORAL_TABLET | Freq: Two times a day (BID) | ORAL | Status: DC
  Administered 2020-12-08 – 2020-12-10 (×5): 2 via ORAL
  Filled 2020-12-08: qty 20

## 2020-12-08 NOTE — Evaluation (Signed)
Occupational Therapy Evaluation Patient Details Name: [name] MRN: [id number] DOB: 1990-04-21 Today's Date: 12/08/2020    History of Present Illness Pt is a 30 y/o female admitted 9/5  with intractable N/V and generally appearing to be out of it (per husband).  She was described to have some flexor postruing of her extremities then became hysterical and combative.  She was intubated on arrival. Transferred to [facility] same day.  MRI suggested PRESS, EEG showed severe encephalopathy.  Transferred to hospital service on 9/9.  PMHx:  DM1, fibromyalgia, gastroparesis, HTN, CKD.  Covid + on admission.   Clinical Impression   Patient admitted for the diagnosis above.  Seen with PT based on ED notes and patient being agitated, but that has resolved, and she is very appropriate.  PTA she lives with her spouse, who is able to assist as needed.  She is essentially at her baseline for ADL and in room mobility.  OT recommends home with prior level of supports.  No acute Ot needs identified.      Follow Up Recommendations  No OT follow up    Equipment Recommendations  None recommended by OT    Recommendations for Other Services       Precautions / Restrictions Precautions Precautions: Fall Restrictions Weight Bearing Restrictions: No      Mobility Bed Mobility Overal bed mobility: Modified Independent                  Transfers Overall transfer level: Needs assistance   Transfers: Sit to/from Stand;Stand Pivot Transfers Sit to Stand: Supervision Stand pivot transfers: Supervision       General transfer comment: cues for safer use of UE during ascent/descent    Balance Overall balance assessment: Mild deficits observed, not formally tested                                         ADL either performed or assessed with clinical judgement   ADL Overall ADL's : At baseline                                             Vision Patient  Visual Report: No change from baseline       Perception     Praxis      Pertinent Vitals/Pain Pain Assessment: No/denies pain Faces Pain Scale: No hurt Pain Intervention(s): Monitored during session     Hand Dominance Right   Extremity/Trunk Assessment Upper Extremity Assessment Upper Extremity Assessment: Overall WFL for tasks assessed   Lower Extremity Assessment Lower Extremity Assessment: Defer to PT evaluation   Cervical / Trunk Assessment Cervical / Trunk Assessment: Normal   Communication Communication Communication: No difficulties   Cognition Arousal/Alertness: Awake/alert Behavior During Therapy: WFL for tasks assessed/performed Overall Cognitive Status: Within Functional Limits for tasks assessed                                                      Home Living Family/patient expects to be discharged to:: Private residence Living Arrangements: Spouse/significant other;Children Available Help at Discharge: Family;Available 24 hours/day;Available PRN/intermittently;Friend(s) Type of Home: [place]  Access: Stairs to enter [employer] of Steps: 4 Entrance Stairs-Rails: Can reach both Home Layout: One level     Bathroom Shower/Tub: Occupational psychologist: Standard Bathroom Accessibility: No   Home Equipment: Environmental consultant - 2 wheels;Wheelchair - [employer];Shower seat;Crutches          Prior Functioning/Environment Level of Independence: Needs assistance  Gait / Transfers Assistance Needed: hopping limited transfers, husband assist with transfers and getting out of bed,  use of WC in house (does not fit in bathroom) ADL's / Homemaking Assistance Needed: husband assists with bathing and LB dressing. pt reports wc/walker unable to fit in bathroom, uses BSC and sponge bathes with assist   Comments: 17 yo daughter at home, family assist with child care        OT Problem List: Impaired balance (sitting  and/or standing)      OT Treatment/Interventions:      OT Goals(Current goals can be found in the care plan section) Acute Rehab OT Goals Patient Stated Goal: ready for prosthesis OT Goal Formulation: With patient Time For Goal Achievement: 12/08/20 Potential to Achieve Goals: Good  OT Frequency:     Barriers to D/C:  None noted          Co-evaluation PT/OT/SLP Co-Evaluation/Treatment: Yes Reason for Co-Treatment: Necessary to address cognition/behavior during functional activity;To address functional/ADL transfers;For patient/therapist safety          AM-PAC OT "6 Clicks" Daily Activity     Outcome Measure Help from another person eating meals?: None Help from another person taking care of personal grooming?: None Help from another person toileting, which includes using toliet, bedpan, or urinal?: A Little Help from another person bathing (including washing, rinsing, drying)?: A Little Help from another person to put on and taking off regular upper body clothing?: None Help from another person to put on and taking off regular lower body clothing?: A Little 6 Click Score: 21   End of Session Nurse Communication: Mobility status  Activity Tolerance: Patient tolerated treatment well Patient left: in chair;with call bell/phone within reach  OT Visit Diagnosis: Unsteadiness on feet (R26.81)                Time: 1216-2446 OT Time Calculation (min): 20 min Charges:  OT General Charges $OT Visit: 1 Visit OT Evaluation $OT Eval Moderate Complexity: 1 Mod  12/08/2020  RP, OTR/L  Acute Rehabilitation Services  Office:  [phone]   Metta Clines 12/08/2020, 1:18 PM

## 2020-12-08 NOTE — Evaluation (Signed)
Physical Therapy Evaluation Patient Details Name: [name] MRN: [id number] DOB: [birth year] Today's Date: 12/08/2020   History of Present Illness  Pt is a 30 y/o female admitted 9/5  with intractable N/V and generally appearing to be out of it (per husband).  She was described to have some flexor postruing of her extremities then became hysterical and combative.  She was intubated on arrival. Transferred to [facility] same day.  MRI suggested PRESS, EEG showed severe encephalopathy.  Transferred to hospital service on 9/9.  PMHx:  DM1, fibromyalgia, gastroparesis, HTN, CKD.  Covid + on admission.  Clinical Impression  Pt admitted with/for above medical complication.  Pt now much improved, needing only supervision for OOB activity.  .  Pt currently limited functionally due to the problems listed below.  (see problems list.)  Pt will benefit from PT to maximize function and safety to be able to get home safely with available assist.     Follow Up Recommendations Home health PT;Other (comment) (to OPPT when ready for prosthetic training)    Equipment Recommendations  None recommended by PT    Recommendations for Other Services       Precautions / Restrictions Precautions Precautions: Fall      Mobility  Bed Mobility Overal bed mobility: Modified Independent                  Transfers Overall transfer level: Needs assistance   Transfers: Sit to/from Stand;Stand Pivot Transfers Sit to Stand: Supervision Stand pivot transfers: Supervision       General transfer comment: cues for safer use of UE during ascent/descent  Ambulation/Gait Ambulation/Gait assistance: Min guard Gait Distance (Feet): 20 Feet Assistive device: Rolling walker (2 wheeled) Gait Pattern/deviations: Step-to pattern   Gait velocity interpretation: <1.8 ft/sec, indicate of risk for recurrent falls General Gait Details: safe use of the RW, low clearance with swing to suggesting need for guarding at this  time.  cues for better safety with use of UE's  Stairs            Wheelchair Mobility    Modified Rankin (Stroke Patients Only)       Balance                                             Pertinent Vitals/Pain Pain Assessment: Faces Faces Pain Scale: No hurt Pain Intervention(s): Monitored during session    Home Living Family/patient expects to be discharged to:: Private residence Living Arrangements: Spouse/significant other;Children Available Help at Discharge: Family;Available 24 hours/day;Available PRN/intermittently;Friend(s) Type of Home: House Home Access: Stairs to enter Entrance Stairs-Rails: Can reach both Entrance Stairs-Number of Steps: 4 Home Layout: One level Home Equipment: Walker - 2 wheels;Wheelchair - [employer];Shower seat;Crutches      Prior Function Level of Independence: Needs assistance   Gait / Transfers Assistance Needed: hopping limited transfers, husband assist with transfers and getting out of bed,  use of WC in house (does not fit in bathroom)  ADL's / Homemaking Assistance Needed: husband assists with bathing and LB dressing. pt reports wc/walker unable to fit in bathroom, uses BSC and sponge bathes with assist  Comments: 78 yo daughter at home, family assist with child care     Hand Dominance   Dominant Hand: Right    Extremity/Trunk Assessment   Upper Extremity Assessment Upper Extremity Assessment: Overall WFL for tasks assessed  Lower Extremity Assessment Lower Extremity Assessment: Overall WFL for tasks assessed;Generalized weakness    Cervical / Trunk Assessment Cervical / Trunk Assessment: Normal  Communication   Communication: No difficulties  Cognition Arousal/Alertness: Awake/alert Behavior During Therapy: WFL for tasks assessed/performed Overall Cognitive Status: Within Functional Limits for tasks assessed                                        General Comments       Exercises     Assessment/Plan    PT Assessment All further PT needs can be met in the next venue of care  PT Problem List Decreased strength;Decreased mobility;Decreased balance       PT Treatment Interventions      PT Goals (Current goals can be found in the Care Plan section)  Acute Rehab PT Goals Patient Stated Goal: ready for prosthesis PT Goal Formulation: All assessment and education complete, DC therapy Potential to Achieve Goals: Good    Frequency     Barriers to discharge        Co-evaluation               AM-PAC PT "6 Clicks" Mobility  Outcome Measure Help needed turning from your back to your side while in a flat bed without using bedrails?: None Help needed moving from lying on your back to sitting on the side of a flat bed without using bedrails?: None Help needed moving to and from a bed to a chair (including a wheelchair)?: A Little Help needed standing up from a chair using your arms (e.g., wheelchair or bedside chair)?: A Little Help needed to walk in hospital room?: A Little Help needed climbing 3-5 steps with a railing? : A Little 6 Click Score: 20    End of Session   Activity Tolerance: Patient tolerated treatment well Patient left: in chair;with call bell/phone within reach Nurse Communication: Mobility status PT Visit Diagnosis: Other abnormalities of gait and mobility (R26.89);Difficulty in walking, not elsewhere classified (R26.2)    Time: 7867-5449 PT Time Calculation (min) (ACUTE ONLY): 16 min   Charges:   PT Evaluation $PT Eval Moderate Complexity: 1 Mod          12/08/2020  [name]., PT Acute Rehabilitation Services [phone]  (pager) [phone]  (office)  [name] [name] 12/08/2020, 12:54 PM

## 2020-12-08 NOTE — Progress Notes (Addendum)
IV Team RN at bedside to assess TPA, red lumen has unable to be unclogged. Blue dead-end cap remains on lumen.  Purple lumen with difficult blood return.   Sent secure chat message to Dr. Alfredia Ferguson: Concerned about her PICC, VAT is asking is we are able to get a new chest x-ray as one lumen is clotted off and the other has difficulty with blood return.    ________________________________________________________ **The above message was written by the nurse at 14:40 pm.  No Secure Chat Message was actually sent by nurse Doreen Beam and repeat CXR was done this AM  12/08/20 at 0702 and read by Radiology which showed Right-sided PICC line tip at superior caval/atrial junction. May need Heparin to be flushed through the line if continues to have issues with blood return.

## 2020-12-08 NOTE — Progress Notes (Signed)
Patient is having running diarrhea. Dr. Alfredia Ferguson contacted to request imodium to assist with easing diarrhea symptoms. Awaiting response/new orders.

## 2020-12-08 NOTE — Progress Notes (Signed)
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  She was brought to the ED and had some flexor posturing prior to arrival and was intubated the ED for combativeness.  Has chronic nausea vomiting in the setting of gastroparesis and a normal gastric emptying study 2021.  She started having some intractable nausea vomiting and woke up and husband felt that she seemed out of it and did not really acknowledge him.  She also did not recognize a photo of the daughter.  She came to the emergency room and at the door she is on done and was described to have some flexor posturing of her extremities last about 60 seconds.  Thereafter she started becoming hysterical and started screaming.  She is very combative and was intubated.  Labs are notable to be elevated with an anion gap of 18 and normal lactate.  She is hypokalemic.  She had an elevated creatinine but has chronic kidney disease.  CT of the abdomen pelvis was done with anasarca or ascites.  CT of the chest revealed bibasilar atelectasis and faint groundglass opacities.  Neurology was consulted and she was transferred to [facility].  She ended up getting a stat MRI and spot EEG.  MRI was consistent with press and EEG was consistent with excessive beta and continuous slow with severe encephalopathy likely related to sedation.  Her blood pressures been elevated and she had as needed labetalol added.  Blood pressure is improving and she is improved from a  neurological standpoint.  Her seizure-like activity witnessed in the ED and neurology following.  They are recommending continue home Ativan and AEDs with Keppra 500 mg every 12 and tapering off gradually.  She was transferred to the hospital service on 12/07/2020.  After further discussion she had inflammatory markers was obtained and chest x-ray showed some mild COVID disease so she was started on Paxlovid.    Assessment & Plan:   Active Problems:   AKI (acute kidney injury) ([place])   Type 1 diabetes mellitus with ketoacidosis without coma (HCC)   Respiratory failure (HCC)   Seizure ([place])   Delirium   Hypertensive emergency   PRES (posterior reversible encephalopathy syndrome)   Malnutrition of moderate degree   Agitation  Acute Metabolic Encephalopathy, back to baseline  Seizure Like Activity  -Improved and she appears at baseline -Likely was multifactorial -She is hypotensive in the ED with MRI suggestive of press -She also has a history of substance abuse -Currently her GCS score is 15 and she did have seizure-like activity and platelets in the ED -Neurology is following and appreciating their assistance and they are recommending Keppra 500 mg every 12h for now  -Continue seizure precautions and Seizure Recommendations given -Per neurology the goal is for a systolic blood pressure less than 140 and avoiding hypotension -Last blood pressure reading was 146/79  Hypertension -Has been poorly controlled -Has been on 3 different medications at home -Current goal is for systolic blood pressure 536 140 with long-term goal being 120/80 -Continue home amlodipine and carvedilol; if necessary will go  up to Carvedilol 25 mg p.o. twice daily -Hydralazine was restarted 50 mg every 8 and was increased today to 100 mg p.o. every 8 -Continue monitor blood pressure for protocol -Last blood pressure reading was 146/79  Diabetes mellitus type 1 -Inpatient blood glucose goal is 140-180  -Attending  sliding scale and stop D10 given that she is now on a diet -Started on Levemir yesterday and changed to sliding scale insulin AC at bedtime and added 2 units of NovoLog 3 times daily with meals -Diabetes education coronary consulted and follow -CBGs ranging from 93-224  AKI on CKD Stage 3b Metabolic acidosis Hyperphosphatemia -She presented with a creatinine of 1.45 and trended elevated from 2.14 but is now improving and BUN/creatinine went from 25/1.91 and trended down to 24/1.66 and today is now 28/1.66 -Patient's metabolic acidosis is improved and she now has a CO2 of 22, anion gap of 4, chloride level 107 -Phosphorus level is now 4.9 -She is initially hypertensive and blood pressure dropped may have caused worsening renal function -Renal ultrasound was done and was negative for hydronephrosis or obstructive nephropathy -Continue MAP of greater than 65 -Avoid nephrotoxic medications, contrast dyes, hypotension renally dose medication -Strict I's and O's and urine output and continue to monitor and trend renal function carefully and repeat CMP in a.m.  Anemia of Chronic Kidney Disease -Stable -Patient hemoglobin/hematocrit is 7.9/24.8 -> 7.6/23.8 -Check Anemia Panel in the AM  -Continue to monitor for signs and symptoms of bleeding; currently no overt bleeding noted -Repeat CBC in a.m.  Hypomagnesemia -In the setting of diarrhea -Patient's magnesium level is now 1.6 -Replete with IV mag sulfate 2 g -Continue monitor and replete as necessary and repeat magnesium level in a.m.  Hyponatremia -Mild.  Sodium went from 136 and trended down to 133 -Continue monitor and trend and repeat CMP in the a.m.  Diarrhea -Has had diarrhea for baseline -States that she wears diapers and pads at home for this -Flexi-Seal was placed but now removed -We will need outpatient follow-up with PCP and supportive care -Will Trial Imodium and do not feel as if it is related to C Diff as she is afebrile  and has no Leukocytosis  Nausea and Vomiting -In the setting of gastroparesis however has a notable normal gastric emptying study in 2021 -Continue monitor QTC and continue with Reglan and as needed Zofran antiemetics  Malnutrition of Moderate Degree -Nutritionist consulted for further evaluation recommendations recommending boost breeze p.o. twice daily as well as Prosource plus 30 mL p.o. twice daily  COVID-positive -Airborne and contact precautions -Incidental finding and she has no respiratory symptoms at all currently -Checked Inflammatory Markers Recent Labs    12/08/20 0227  DDIMER 2.44*  FERRITIN 470*  LDH 217*  CRP 0.6    Lab Results  Component Value Date   SARSCOV2NAA POSITIVE (A) 12/04/2020   [place] NEGATIVE 11/12/2020   [place] NEGATIVE 11/03/2020   [place] NEGATIVE 09/25/2020  -Discussed with the patient about starting remdesivir versus Paxlovid and she has elected for Paxlovid -Chest x-ray done and showed "Bilateral interstitial coarsening is favored to be chronic. Especially in the left upper lobe, mild COVID-19 pneumonia could look similar." -Will order Flutter Valve, Incentive Spirometry, and Guaifenesin 1200 mg po BID -We will discontinue DuoNeb every 6 as needed and add Combivent 1 puff every 6 as needed for wheezing or shortness of breath -We will add zinc and vitamin C    DVT prophylaxis: Heparin 5,000 units sq q8h Code Status: FULL CODE  Family Communication: No family present at bedside Disposition Plan: Pending further clinical improvement and she has been transferred to the stepdown unit out of the ICU however currently no bed available  Status is: Inpatient  Remains inpatient appropriate because:Unsafe d/c plan, IV treatments appropriate due to intensity of illness or inability to take PO, and Inpatient level of care appropriate due to severity of illness  Dispo: The patient is from: Home              Anticipated d/c is to: Home               Patient currently is not medically stable to d/c.   Difficult to place patient No  Consultants:  Neurology PCCM Transfer   Procedures:  EEG IMPRESSION: This study was initially suggestive of severe diffuse encephalopathy, nonspecific etiology but likely related to sedation.  As sedation was weaned, EEG improved and was suggestive of moderate diffuse encephalopathy.  No seizures or epileptiform discharges were seen throughout the recording.  Antimicrobials:  Anti-infectives (From admission, onward)    Start     Dose/Rate Route Frequency Ordered Stop   12/08/20 1230  nirmatrelvir/ritonavir EUA (renal dosing) (PAXLOVID) 2 tablet        2 tablet Oral 2 times daily 12/08/20 1139 12/13/20 0959        Subjective: Seen and examined at bedside she was doing okay.  Was improved and denied any chest pain or shortness of breath.  Discussed with her about COVID treatment options and she is elected for Paxlovid.  She denies any lightheadedness or dizziness.  No other concerns or plans at this time and still waiting for PT and OT to see her.  Objective: Vitals:   12/08/20 0800 12/08/20 0938 12/08/20 1237 12/08/20 1533  BP: (!) 153/88 (!) 148/84 135/65 (!) 146/79  Pulse: 93 90 87 86  Resp: 18 14 13 17   Temp:   98.2 F (36.8 C) 97.8 F (36.6 C)  TempSrc:   Oral Oral  SpO2: 100% 96% 100% 100%  Weight:      Height:        Intake/Output Summary (Last 24 hours) at 12/08/2020 1755 Last data filed at 12/08/2020 1330 Gross per 24 hour  Intake 410 ml  Output --  Net 410 ml    Filed Weights   12/03/20 2000 12/05/20 0500 12/07/20 0500  Weight: 70.4 kg 74.1 kg 75.2 kg   Examination: Physical Exam:  Constitutional: Chronically ill-appearing African-American female currently in no acute distress appears calm Eyes: Lids and conjunctivae normal, sclerae anicteric  ENMT: External Ears, Nose appear normal. Grossly normal hearing.  Neck: Appears normal, supple, no cervical masses, normal  ROM, no appreciable thyromegaly; no appreciable JVD Respiratory: Diminished to auscultation bilaterally with coarse breath sounds and some slight crackles, no wheezing, rales, rhonchi. Normal respiratory effort and patient is not tachypenic. No accessory muscle use.  Unlabored breathing Cardiovascular: RRR, no murmurs / rubs / gallops. S1 and S2 auscultated. No extremity edema.  Abdomen: Soft, non-tender, non-distended.  Bowel sounds positive.  GU: Deferred. Musculoskeletal: No clubbing / cyanosis of digits/nails.  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 No induration; Warm and dry.  Neurologic: CN 2-12 grossly intact with no focal deficits. Romberg sign and cerebellar reflexes not assessed.  Psychiatric: Normal judgment and insight. Alert and oriented  x 3. Normal mood and appropriate affect.   Data Reviewed: I have personally reviewed following labs and imaging studies  CBC: Recent Labs  Lab 12/03/20 0516 12/03/20 0847 12/04/20 0440 12/04/20 1315 12/05/20 0345 12/06/20 0630 12/07/20 1003 12/08/20 0227  WBC 10.1   < > 7.7  --  7.3 7.4 6.5 8.0  NEUTROABS 7.4  --   --   --   --   --  4.3 5.7  HGB 10.9*   < > 7.5* 8.1* 7.8* 8.0* 7.9* 7.6*  HCT 34.4*   < > 23.6* 25.6* 25.0* 26.0* 24.8* 23.9*  MCV 92.0   < > 90.4  --  92.9 94.2 91.9 91.9  PLT 309   < > 168  --  168 165 171 177   < > = values in this interval not displayed.    Basic Metabolic Panel: Recent Labs  Lab 12/04/20 0440 12/05/20 0345 12/06/20 0630 12/07/20 0520 12/08/20 0227  NA 138 137 137 136 133*  K 3.2* 4.3 3.5 4.1 4.2  CL 109 109 109 106 107  CO2 24 24 23  21* 22  GLUCOSE 90 72 75 163* 233*  BUN 23* 22* 25* 24* 28*  CREATININE 1.74* 2.14* 1.91* 1.66* 1.64*  CALCIUM 7.4* 7.5* 7.7* 8.4* 7.9*  MG 1.3* 2.1  --  1.8 1.6*  PHOS  --  3.2  --   --  4.9*     GFR: Estimated Creatinine Clearance: 52.6 mL/min (A) (by C-G formula based on SCr of 1.64 mg/dL (H)). Liver Function Tests: Recent Labs  Lab 12/03/20 0516 12/05/20 0345 12/08/20 0227  AST 27 13* 22  ALT 11 8 10   ALKPHOS 155* 105 94  BILITOT 1.1 0.6 0.5  PROT 7.4 5.2* 5.5*  ALBUMIN 2.5* 1.5* 1.7*    No results for input(s): LIPASE, AMYLASE in the last 168 hours. No results for input(s): AMMONIA in the last 168 hours. Coagulation Profile: Recent Labs  Lab 12/03/20 0847  INR 1.3*    Cardiac Enzymes: Recent Labs  Lab 12/03/20 0516  CKTOTAL 93    BNP (last 3 results) No results for input(s): PROBNP in the last 8760 hours. HbA1C: No results for input(s): HGBA1C in the last 72 hours. CBG: Recent Labs  Lab 12/07/20 1948 12/07/20 2252 12/08/20 0935 12/08/20 1238 12/08/20 1533  GLUCAP 168* 204* 224* 132* 93    Lipid Profile: No results for input(s): CHOL, HDL, LDLCALC, TRIG, CHOLHDL, LDLDIRECT in the last 72 hours.  Thyroid Function Tests: No results for input(s): TSH, T4TOTAL, FREET4, T3FREE, THYROIDAB in the last 72 hours. Anemia Panel: Recent Labs    12/08/20 0227  FERRITIN 470*   Sepsis Labs: Recent Labs  Lab 12/03/20 0630  LATICACIDVEN 1.0     Recent Results (from the past 240 hour(s))  MRSA Next Gen by PCR, Nasal     Status: None   Collection Time: 12/03/20  1:56 PM   Specimen: Nasal Mucosa; Nasal Swab  Result Value Ref Range Status   MRSA by PCR Next Gen NOT DETECTED NOT DETECTED Final    Comment: (NOTE) The GeneXpert MRSA Assay (FDA approved for NASAL specimens only), is one component of a comprehensive MRSA colonization surveillance program. It is not intended to diagnose MRSA infection nor to guide or monitor treatment for MRSA infections. Test performance is not FDA approved in patients less than 14 years old. Performed at [facility], [place] [address] [place], [place] [address]   SARS CORONAVIRUS 2 (TAT 6-24  HRS)  Nasopharyngeal Nasopharyngeal Swab     Status: Abnormal   Collection Time: 12/04/20  3:03 PM   Specimen: Nasopharyngeal Swab  Result Value Ref Range Status   SARS Coronavirus 2 POSITIVE (A) NEGATIVE Final    Comment: (NOTE) SARS-CoV-2 target nucleic acids are DETECTED.  The SARS-CoV-2 RNA is generally detectable in upper and lower respiratory specimens during the acute phase of infection. Positive results are indicative of the presence of SARS-CoV-2 RNA. Clinical correlation with patient history and other diagnostic information is  necessary to determine patient infection status. Positive results do not rule out bacterial infection or co-infection with other viruses.  The expected result is Negative.  Fact Sheet for Patients: SugarRoll.be  Fact Sheet for Healthcare Providers: https://www.woods-mathews.com/  This test is not yet approved or cleared by the [place] FDA and  has been authorized for detection and/or diagnosis of SARS-CoV-2 by FDA under an Emergency Use Authorization (EUA). This EUA will remain  in effect (meaning this test can be used) for the duration of the COVID-19 declaration under Section 564(b)(1) of the Act, 21 U. S.C. section 360bbb-3(b)(1), unless the authorization is terminated or revoked sooner.   Performed at [facility], [place] [address], [place], [place] [address]       RN Pressure Injury Documentation:     Estimated body mass index is 25.97 kg/m as calculated from the following:   Height as of this encounter: 5' 7"  (1.702 m).   Weight as of this encounter: 75.2 kg.  Malnutrition Type:  Nutrition Problem: Moderate Malnutrition Etiology: chronic illness (gastroparesis, chronic nausea/vomiting, uncontrolled T1DM)  Malnutrition Characteristics:  Signs/Symptoms: mild fat depletion, moderate muscle depletion  Nutrition Interventions:  Interventions: Boost Breeze, Prostat   Radiology  Studies: DG CHEST PORT 1 VIEW  Result Date: 12/08/2020 CLINICAL DATA:  Back pain. Altered mental status. COVID-19 positive. EXAM: PORTABLE CHEST 1 VIEW COMPARISON:  12/03/2020 FINDINGS: Right-sided PICC line tip at superior caval/atrial junction. Interval extubation. Midline trachea. Borderline cardiomegaly. No pleural effusion or pneumothorax. Left greater than right interstitial coarsening without well-defined lobar consolidation. IMPRESSION: Bilateral interstitial coarsening is favored to be chronic. Especially in the left upper lobe, mild COVID-19 pneumonia could look similar. Electronically Signed   By: Abigail Miyamoto M.D.   On: 12/08/2020 10:32    Scheduled Meds:  (feeding supplement) PROSource Plus  30 mL Oral BID BM   amLODipine  10 mg Oral Daily   busPIRone  5 mg Oral TID   carvedilol  12.5 mg Oral BID WC   Chlorhexidine Gluconate Cloth  6 each Topical Daily   feeding supplement  1 Container Oral BID BM   gabapentin  200 mg Oral TID   heparin  5,000 Units Subcutaneous Q8H   hydrALAZINE  100 mg Oral Q8H   insulin aspart  0-5 Units Subcutaneous QHS   insulin aspart  0-9 Units Subcutaneous TID WC   insulin aspart  2 Units Subcutaneous TID WC   insulin glargine-yfgn  3 Units Subcutaneous Daily   levETIRAcetam  500 mg Oral BID   LORazepam  0.5 mg Oral BID   metoCLOPramide  5 mg Oral TID AC   nirmatrelvir/ritonavir EUA (renal dosing)  2 tablet Oral BID   sodium chloride flush  10-40 mL Intracatheter Q12H   Continuous Infusions:  sodium chloride Stopped (12/07/20 1228)    LOS: 5 days   Kerney Elbe, DO [facility] PAGER is on AMION  If 7PM-7AM, please contact night-coverage [email or web address]

## 2020-12-08 NOTE — Plan of Care (Signed)

## 2020-12-09 ENCOUNTER — Inpatient Hospital Stay (HOSPITAL_COMMUNITY): Payer: Medicaid Other

## 2020-12-09 DIAGNOSIS — N179 Acute kidney failure, unspecified: Secondary | ICD-10-CM | POA: Diagnosis not present

## 2020-12-09 DIAGNOSIS — R410 Disorientation, unspecified: Secondary | ICD-10-CM | POA: Diagnosis not present

## 2020-12-09 DIAGNOSIS — R7989 Other specified abnormal findings of blood chemistry: Secondary | ICD-10-CM

## 2020-12-09 DIAGNOSIS — R569 Unspecified convulsions: Secondary | ICD-10-CM | POA: Diagnosis not present

## 2020-12-09 DIAGNOSIS — U071 COVID-19: Secondary | ICD-10-CM

## 2020-12-09 DIAGNOSIS — R609 Edema, unspecified: Secondary | ICD-10-CM

## 2020-12-09 DIAGNOSIS — R451 Restlessness and agitation: Secondary | ICD-10-CM | POA: Diagnosis not present

## 2020-12-09 LAB — COMPREHENSIVE METABOLIC PANEL
ALT: 16 U/L (ref 0–44)
AST: 27 U/L (ref 15–41)
Albumin: 1.8 g/dL — ABNORMAL LOW (ref 3.5–5.0)
Alkaline Phosphatase: 92 U/L (ref 38–126)
Anion gap: 5 (ref 5–15)
BUN: 35 mg/dL — ABNORMAL HIGH (ref 6–20)
CO2: 22 mmol/L (ref 22–32)
Calcium: 8.2 mg/dL — ABNORMAL LOW (ref 8.9–10.3)
Chloride: 108 mmol/L (ref 98–111)
Creatinine, Ser: 1.77 mg/dL — ABNORMAL HIGH (ref 0.44–1.00)
GFR, Estimated: 39 mL/min — ABNORMAL LOW (ref >60)
Glucose, Bld: 307 mg/dL — ABNORMAL HIGH (ref 70–99)
Potassium: 4.3 mmol/L (ref 3.5–5.1)
Sodium: 135 mmol/L (ref 135–145)
Total Bilirubin: 0.5 mg/dL (ref 0.3–1.2)
Total Protein: 5.9 g/dL — ABNORMAL LOW (ref 6.5–8.1)

## 2020-12-09 LAB — CBC WITH DIFFERENTIAL/PLATELET
Abs Immature Granulocytes: 0.02 10*3/uL (ref 0.00–0.07)
Basophils Absolute: 0 10*3/uL (ref 0.0–0.1)
Basophils Relative: 1 %
Eosinophils Absolute: 0.3 10*3/uL (ref 0.0–0.5)
Eosinophils Relative: 7 %
HCT: 24.9 % — ABNORMAL LOW (ref 36.0–46.0)
Hemoglobin: 7.9 g/dL — ABNORMAL LOW (ref 12.0–15.0)
Immature Granulocytes: 0 %
Lymphocytes Relative: 25 %
Lymphs Abs: 1.2 10*3/uL (ref 0.7–4.0)
MCH: 29.8 pg (ref 26.0–34.0)
MCHC: 31.7 g/dL (ref 30.0–36.0)
MCV: 94 fL (ref 80.0–100.0)
Monocytes Absolute: 0.5 10*3/uL (ref 0.1–1.0)
Monocytes Relative: 11 %
Neutro Abs: 2.8 10*3/uL (ref 1.7–7.7)
Neutrophils Relative %: 56 %
Platelets: 215 10*3/uL (ref 150–400)
RBC: 2.65 MIL/uL — ABNORMAL LOW (ref 3.87–5.11)
RDW: 13.8 % (ref 11.5–15.5)
WBC: 4.9 10*3/uL (ref 4.0–10.5)
nRBC: 0 % (ref 0.0–0.2)

## 2020-12-09 LAB — GLUCOSE, CAPILLARY
Glucose-Capillary: 282 mg/dL — ABNORMAL HIGH (ref 70–99)
Glucose-Capillary: 154 mg/dL — ABNORMAL HIGH (ref 70–99)
Glucose-Capillary: 116 mg/dL — ABNORMAL HIGH (ref 70–99)
Glucose-Capillary: 168 mg/dL — ABNORMAL HIGH (ref 70–99)

## 2020-12-09 LAB — D-DIMER, QUANTITATIVE: D-Dimer, Quant: >20 ug/mL-FEU — ABNORMAL HIGH (ref 0.00–0.50)

## 2020-12-09 LAB — HEPARIN LEVEL (UNFRACTIONATED): Heparin Unfractionated: 0.53 IU/mL (ref 0.30–0.70)

## 2020-12-09 LAB — LACTATE DEHYDROGENASE: LDH: 213 U/L — ABNORMAL HIGH (ref 98–192)

## 2020-12-09 LAB — PHOSPHORUS: Phosphorus: 4.2 mg/dL (ref 2.5–4.6)

## 2020-12-09 LAB — FERRITIN: Ferritin: 460 ng/mL — ABNORMAL HIGH (ref 11–307)

## 2020-12-09 LAB — MAGNESIUM: Magnesium: 2.2 mg/dL (ref 1.7–2.4)

## 2020-12-09 LAB — FIBRINOGEN: Fibrinogen: 175 mg/dL — ABNORMAL LOW (ref 210–475)

## 2020-12-09 LAB — SEDIMENTATION RATE: Sed Rate: 110 mm/hr — ABNORMAL HIGH (ref 0–22)

## 2020-12-09 LAB — C-REACTIVE PROTEIN: CRP: <0.5 mg/dL (ref <1.0)

## 2020-12-09 IMAGING — DX DG CHEST 1V PORT
1 series · 1 of 1 positions shown · non-contrast
Comparison: [DATE]

CLINICAL DATA: Shortness of breath.

EXAM:
PORTABLE CHEST 1 VIEW

[chest ap]
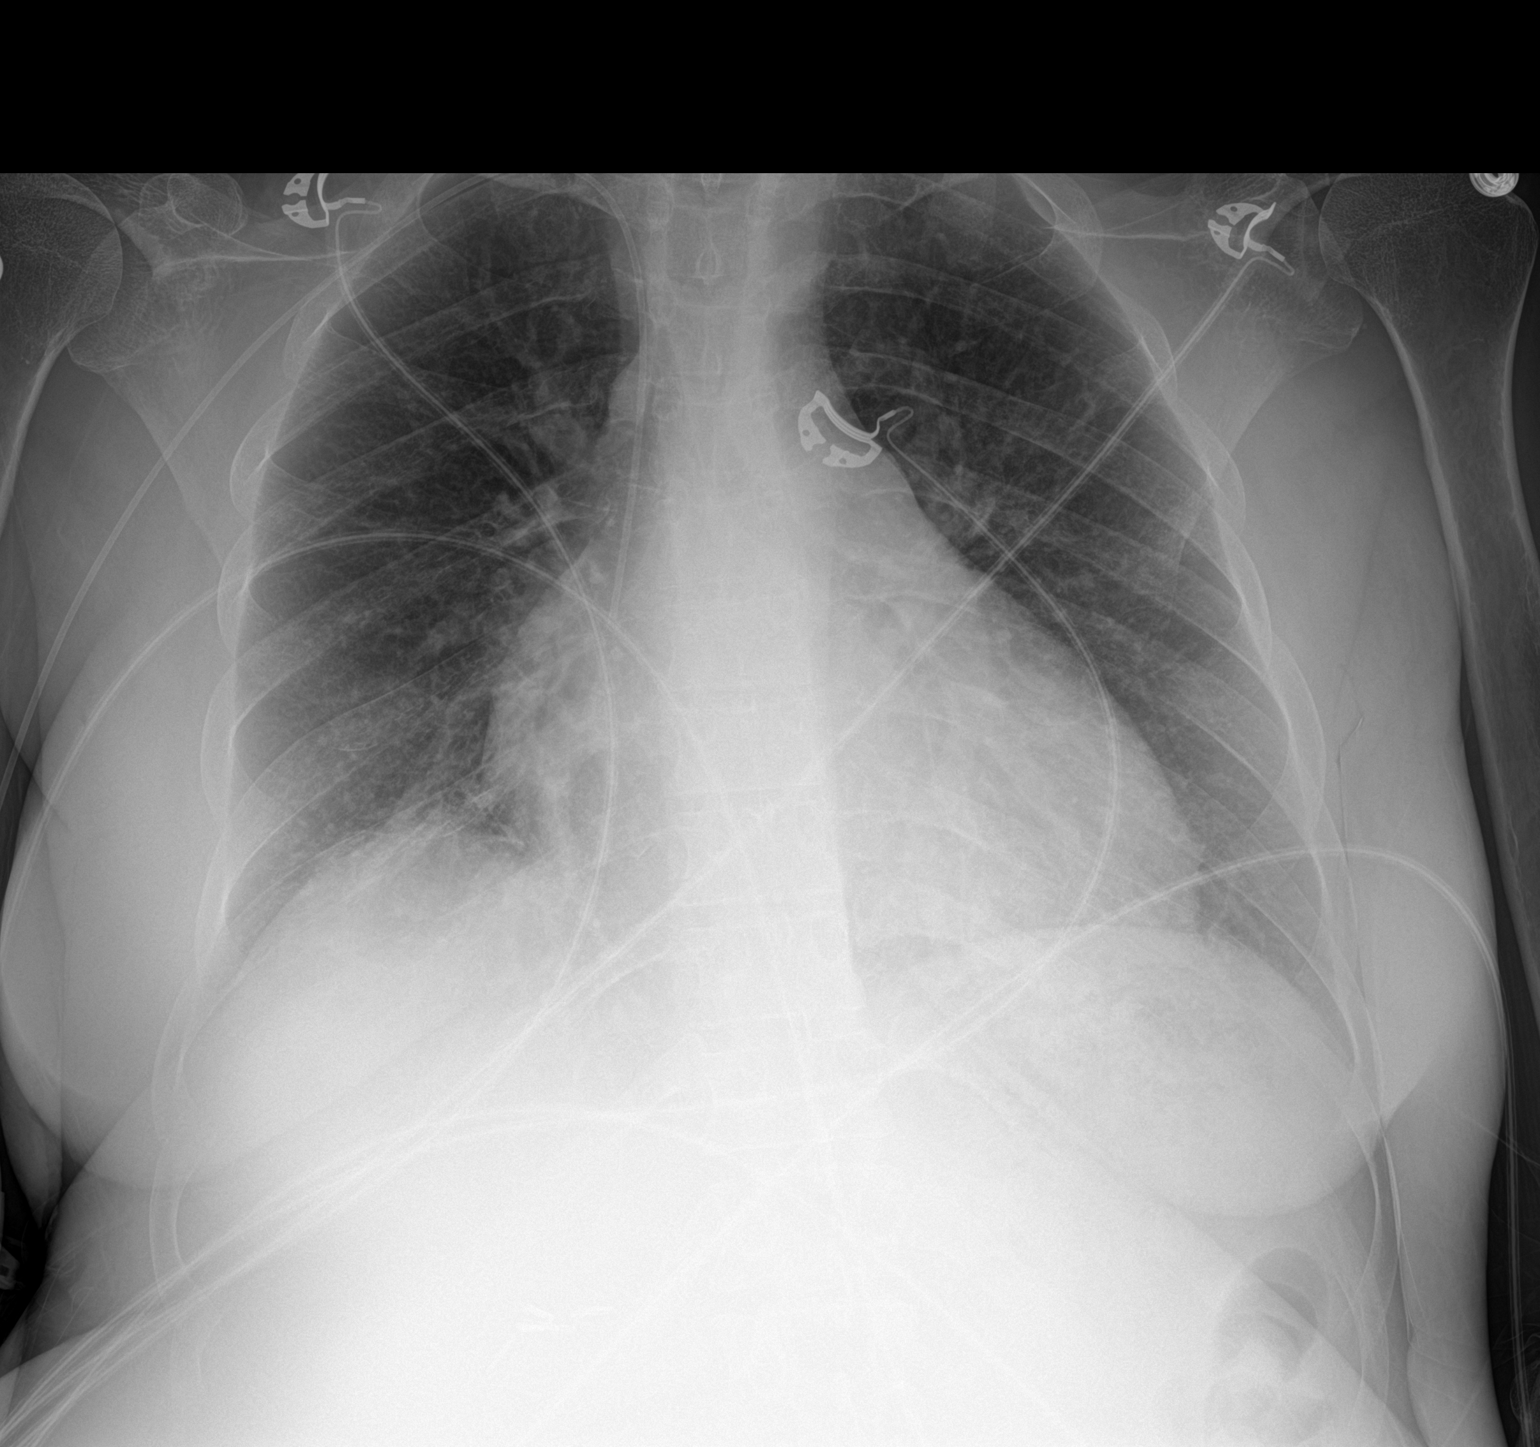

[1 of 1 positions shown; findings below may reference images not displayed]

FINDINGS: Right PICC line in stable position.

Borderline enlarged cardiac silhouette.

Mediastinal contours appear intact.

There is no evidence of pleural effusion or pneumothorax. Mild
peribronchial airspace consolidation, left greater than right lung
base.

Osseous structures are without acute abnormality. Soft tissues are
grossly normal.
IMPRESSION: Mild peribronchial airspace consolidation, left greater than right
lung base may represent atypical pneumonia.

## 2020-12-09 MED ORDER — HEPARIN BOLUS VIA INFUSION
4700.0000 [IU] | Freq: Once | INTRAVENOUS | Status: AC
  Administered 2020-12-09: 4700 [IU] via INTRAVENOUS
  Filled 2020-12-09: qty 4700

## 2020-12-09 MED ORDER — HEPARIN (PORCINE) 25000 UT/250ML-% IV SOLN
1300.0000 [IU]/h | INTRAVENOUS | Status: DC
  Administered 2020-12-09 – 2020-12-10 (×2): 1300 [IU]/h via INTRAVENOUS
  Filled 2020-12-09 (×2): qty 250

## 2020-12-09 MED ORDER — ALTEPLASE 2 MG IJ SOLR
2.0000 mg | Freq: Once | INTRAMUSCULAR | Status: AC
  Administered 2020-12-09: 2 mg
  Filled 2020-12-09: qty 2

## 2020-12-09 NOTE — Progress Notes (Signed)
ANTICOAGULATION CONSULT NOTE   Pharmacy Consult for Heparin Indication:  VTE  Allergies  Allergen Reactions   Other Anaphylaxis    Reaction to [place] nuts    Lactose Intolerance (Gi) Diarrhea    Patient Measurements: Height: 5' 7"  (170.2 cm) Weight: 75.2 kg (165 lb 12.6 oz) IBW/kg (Calculated) : 61.6 Heparin Dosing Weight: 75.2 kg  Vital Signs: Temp: 98.2 F (36.8 C) (09/11 1621) Temp Source: Oral (09/11 1621) BP: 120/60 (09/11 1621) Pulse Rate: 82 (09/11 1621)  Labs: Recent Labs    12/07/20 0520 12/07/20 1003 12/07/20 1003 12/08/20 0227 12/09/20 0905 12/09/20 1850  HGB  --  7.9*   < > 7.6* 7.9*  --   HCT  --  24.8*  --  23.9* 24.9*  --   PLT  --  171  --  177 215  --   HEPARINUNFRC  --   --   --   --   --  0.53  CREATININE 1.66*  --   --  1.64* 1.77*  --    < > = values in this interval not displayed.     Estimated Creatinine Clearance: 48.7 mL/min (A) (by C-G formula based on SCr of 1.77 mg/dL (H)).   Medical History: Past Medical History:  Diagnosis Date   Allergy    Anemia    Anxiety    Blood transfusion without reported diagnosis    Dec 2018   Cataract    right eye   COVID-19 03/2020   Depression    Diabetes type 1, uncontrolled ([place]) 11/14/2011   Since age 38   Fibromyalgia    Gastroparesis    GERD (gastroesophageal reflux disease)    Hypertension    Infection    UTI April 2016    Medications:  Scheduled:   (feeding supplement) PROSource Plus  30 mL Oral BID BM   amLODipine  10 mg Oral Daily   vitamin C  500 mg Oral Daily   busPIRone  5 mg Oral TID   carvedilol  12.5 mg Oral BID WC   Chlorhexidine Gluconate Cloth  6 each Topical Daily   feeding supplement  1 Container Oral BID BM   gabapentin  200 mg Oral TID   guaiFENesin  1,200 mg Oral BID   hydrALAZINE  100 mg Oral Q8H   insulin aspart  0-5 Units Subcutaneous QHS   insulin aspart  0-9 Units Subcutaneous TID WC   insulin aspart  2 Units Subcutaneous TID WC   insulin  glargine-yfgn  3 Units Subcutaneous Daily   levETIRAcetam  500 mg Oral BID   LORazepam  0.5 mg Oral BID   metoCLOPramide  5 mg Oral TID AC   nirmatrelvir/ritonavir EUA (renal dosing)  2 tablet Oral BID   sodium chloride flush  10-40 mL Intracatheter Q12H   zinc sulfate  220 mg Oral Daily   Infusions:   sodium chloride Stopped (12/07/20 1228)   heparin 1,300 Units/hr (12/09/20 1332)    Assessment: 30 YO female with COVID who presented with encephalopathy and history of chronic nausea and vomiting. She had severe hypertension, hypokalemia, anion gap of 18, and PRESS. Her D-dimer increased from 2.44 to >20 in about 24 hours. She was on [place] heparin for DVT ppx until ~0530 9/11. After discussing with Dr. Alfredia Ferguson, worried about possible VTE, but unable to get a CT due to poor renal function and difficulty with imaging. Given renal function and high suspicion of clot, will give a lower range bolus (62.5  units/kg) and start VTE treatment dose.  Heparin level this evening is within goal range at 0.53.  No overt bleeding or complications noted.  Goal of Therapy:  Heparin level 0.3-0.7 units/ml Monitor platelets by anticoagulation protocol: Yes   Plan:  Continue IV heparin at current rate of 1300 units/hr. Repeat heparin level in 6 hrs. Daily heparin level and CBC.  [place] Crane, Roylene Reason, BCCP Clinical Pharmacist  12/09/2020 8:02 PM   [facility] pharmacy phone numbers are listed on [email or web address]

## 2020-12-09 NOTE — Progress Notes (Signed)
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  She was brought to the ED and had some flexor posturing prior to arrival and was intubated the ED for combativeness.  Has chronic nausea vomiting in the setting of gastroparesis and a normal gastric emptying study 2021.  She started having some intractable nausea vomiting and woke up and husband felt that she seemed out of it and did not really acknowledge him.  She also did not recognize a photo of the daughter.  She came to the emergency room and at the door she is on done and was described to have some flexor posturing of her extremities last about 60 seconds.  Thereafter she started becoming hysterical and started screaming.  She is very combative and was intubated.  Labs are notable to be elevated with an anion gap of 18 and normal lactate.  She is hypokalemic.  She had an elevated creatinine but has chronic kidney disease.  CT of the abdomen pelvis was done with anasarca or ascites.  CT of the chest revealed bibasilar atelectasis and faint groundglass opacities.  Neurology was consulted and she was transferred to [facility].  She ended up getting a stat MRI and spot EEG.  MRI was consistent with press and EEG was consistent with excessive beta and continuous slow with severe encephalopathy likely related to sedation.  Her blood pressures been elevated and she had as needed labetalol added.  Blood pressure is improving and she is improved from a  neurological standpoint.  Her seizure-like activity witnessed in the ED and neurology following.  They are recommending continue home Ativan and AEDs with Keppra 500 mg every 12 and tapering off gradually.  She was transferred to the hospital service on 12/07/2020.  After further discussion she had inflammatory markers was obtained and chest x-ray showed some mild COVID disease so she was started on Paxlovid. D-Dimer was significantly elevated today (>20.00) so will get empirically start her on a Heparin gtt. unfortunately we cannot get a CTA of the chest to rule out PE given her creatinine level but will obtain lower extremity venous duplex.   Assessment & Plan:   Active Problems:   AKI (acute kidney injury) ([place])   Type 1 diabetes mellitus with ketoacidosis without coma (HCC)   Respiratory failure (HCC)   Seizure ([place])   Delirium   Hypertensive emergency   PRES (posterior reversible encephalopathy syndrome)   Malnutrition of moderate degree   Agitation  Acute Metabolic Encephalopathy, back to baseline  Seizure Like Activity  -Improved and she appears at baseline -Likely was multifactorial -She is hypotensive in the ED with MRI suggestive of press -She also has a history of substance abuse -Currently her GCS score is 15 and she did have seizure-like activity and platelets in the ED -Neurology is following and appreciating their assistance and they are recommending Keppra 500 mg every 12h for now  -Continue seizure precautions and Seizure Recommendations given -Per neurology the goal is for a systolic blood pressure less than 140 and avoiding hypotension -Last blood pressure reading  was 146/79  Hypertension -Has been poorly controlled -Has been on 3 different medications at home -Current goal is for systolic blood pressure 449 140 with long-term goal being 120/80 -Continue home amlodipine and carvedilol; if necessary will go up to Carvedilol 25 mg p.o. twice daily -Hydralazine was  restarted 50 mg every 8 and was increased today to 100 mg p.o. every 8 -Continue monitor blood pressure for protocol -Last blood pressure reading was 120/60  Diabetes mellitus type 1 -Inpatient blood glucose goal is 140-180  -Attending sliding scale and stop D10 given that she is now on a diet -Started on Levemir yesterday and changed to sliding scale insulin AC at bedtime and added 2 units of NovoLog 3 times daily with meals -Diabetes education coronary consulted and follow -CBGs ranging from 112-282  AKI on CKD Stage 3b Metabolic acidosis Hyperphosphatemia -She presented with a creatinine of 1.45 and trended elevated from 2.14 but is now improving and BUN/creatinine went from 25/1.91 and trended down to 24/1.66 the day before yesterday and trended down to 28/1.64 yesterday and today it is 35/1.77 -Patient's metabolic acidosis is improved and she now has a CO2 of 22, anion gap of 5, chloride level 108 -Phosphorus level is now 4.2 and improved -She is initially hypertensive and blood pressure dropped may have caused worsening renal function -Renal ultrasound was done and was negative for hydronephrosis or obstructive nephropathy -Continue MAP of greater than 65 -Avoid nephrotoxic medications, contrast dyes, hypotension renally dose medication -Strict I's and O's and urine output and continue to monitor and trend renal function carefully and repeat CMP in a.m.  Anemia of Chronic Kidney Disease -Stable -Patient hemoglobin/hematocrit is 7.9/24.8 -> 7.6/23.8 -> 7.9/24.9 -Check Anemia Panel in the AM  -Continue to monitor for signs and symptoms of bleeding now that Heparin gtt is starting; currently no overt bleeding noted -Repeat CBC in a.m.  Hypomagnesemia -In the setting of diarrhea -Patient's magnesium level is now improved to 2.2 -Replete with IV mag sulfate 2 g yesterday  -Continue monitor and replete as necessary and repeat magnesium level in a.m.  Hyponatremia -Mild.  Sodium  went from 136 and trended down to 133 and is now 135 -Continue monitor and trend and repeat CMP in the a.m.  Diarrhea -Has had diarrhea for baseline -States that she wears diapers and pads at home for this -Flexi-Seal was placed but now removed -We will need outpatient follow-up with PCP and supportive care -Will Trial Imodium and do not feel as if it is related to C Diff as she is afebrile and has no Leukocytosis  Nausea and Vomiting -In the setting of gastroparesis however has a notable normal gastric emptying study in 2021 -Continue monitor QTC and continue with Reglan and as needed Zofran antiemetics -Nausea and Vomiting have improved   Malnutrition of Moderate Degree -Nutritionist consulted for further evaluation recommendations recommending boost breeze p.o. twice daily as well as Prosource plus 30 mL p.o. twice daily  COVID-positive -Airborne and contact precautions -Incidental finding and she has no respiratory symptoms at all currently -Checked Inflammatory Markers Recent Labs    12/08/20 0227 12/09/20 0905  DDIMER 2.44* >20.00*  FERRITIN 470* 460*  LDH 217* 213*  CRP 0.6 <0.5   -D-Dimer Significantly elevated  Lab Results  Component Value Date   SARSCOV2NAA POSITIVE (A) 12/04/2020   [place] NEGATIVE 11/12/2020   [place] NEGATIVE 11/03/2020   [place] NEGATIVE 09/25/2020   -Discussed with the patient about starting remdesivir versus Paxlovid and she has elected  for Paxlovid -Chest x-ray done and showed "Mild peribronchial airspace consolidation, left greater than right lung base may represent atypical pneumonia."; She is asymptomataic  -Will order Flutter Valve, Incentive Spirometry, and Guaifenesin 1200 mg po BID -We will discontinue DuoNeb every 6 as needed and add Combivent 1 puff every 6 as needed for wheezing or shortness of breath -We will add zinc and vitamin C -Check LE venous duplex to r/o VTE and unfortunately cannot get a CTA PE protocol  given Renal Fxn but will order a VQ scan to evaluate    DVT prophylaxis: Heparin 5,000 units sq q8h changed to heparin drip given her significantly elevated D-dimer Code Status: FULL CODE Family Communication: No family present at bedside Disposition Plan: Pending further clinical improvement and she has been transferred to the stepdown unit out of the ICU however currently no bed available  Status is: Inpatient  Remains inpatient appropriate because:Unsafe d/c plan, IV treatments appropriate due to intensity of illness or inability to take PO, and Inpatient level of care appropriate due to severity of illness  Dispo: The patient is from: Home              Anticipated d/c is to: Home              Patient currently is not medically stable to d/c.   Difficult to place patient No  Consultants:  Neurology PCCM Transfer   Procedures:  EEG IMPRESSION: This study was initially suggestive of severe diffuse encephalopathy, nonspecific etiology but likely related to sedation.  As sedation was weaned, EEG improved and was suggestive of moderate diffuse encephalopathy.  No seizures or epileptiform discharges were seen throughout the recording.  Antimicrobials:  Anti-infectives (From admission, onward)    Start     Dose/Rate Route Frequency Ordered Stop   12/08/20 1230  nirmatrelvir/ritonavir EUA (renal dosing) (PAXLOVID) 2 tablet        2 tablet Oral 2 times daily 12/08/20 1139 12/13/20 0959        Subjective: Seen and examined at bedside and she is doing okay.  Blood sugars have been better controlled and she denies any chest pain, shortness of breath.  No nausea or vomiting.  Diarrhea is improving some.  D-dimer elevated significantly so will place on a heparin drip and obtain further work-up to evaluate for VTE.  No other concerns or complaints at this time and does not have a cough.  Objective: Vitals:   12/09/20 0800 12/09/20 1002 12/09/20 1200 12/09/20 1621  BP: (!) 165/107 (!)  169/93 (!) 148/79 120/60  Pulse: 85 87 87 82  Resp: 10 15 14 14   Temp:  97.8 F (36.6 C)  98.2 F (36.8 C)  TempSrc:  Oral  Oral  SpO2: 100% 100% 100% 100%  Weight:      Height:        Intake/Output Summary (Last 24 hours) at 12/09/2020 1733 Last data filed at 12/09/2020 1500 Gross per 24 hour  Intake 315.12 ml  Output --  Net 315.12 ml    Filed Weights   12/03/20 2000 12/05/20 0500 12/07/20 0500  Weight: 70.4 kg 74.1 kg 75.2 kg   Examination: Physical Exam:  Constitutional: Chronically ill-appearing African-American female currently in no acute distress appears calm Eyes: Lids and conjunctivae normal, sclerae anicteric  ENMT: External Ears, Nose appear normal. Grossly normal hearing.  Neck: Appears normal, supple, no cervical masses, normal ROM, no appreciable thyromegaly; no JVD Respiratory: Diminished to auscultation bilaterally with coarse breath sounds and  some slight crackles, no wheezing, rales, rhonchi or crackles. Normal respiratory effort and patient is not tachypenic. No accessory muscle use. Unlabored breathing  Cardiovascular: RRR, no murmurs / rubs / gallops. S1 and S2 auscultated. Mild LE edema on the Left Abdomen: Soft, non-tender, non-distended.  Bowel sounds positive x4.  GU: Deferred. Musculoskeletal: No clubbing / cyanosis of digits/nails.  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 No induration; Warm and dry.  Neurologic: CN 2-12 grossly intact with no focal deficits. Romberg sign and cerebellar reflexes not assessed.  Psychiatric: Normal judgment and insight. Alert and oriented x 3. Normal mood and appropriate affect.   Data Reviewed: I have personally reviewed following labs and imaging studies  CBC: Recent Labs  Lab 12/03/20 0516 12/03/20 0847 12/05/20 0345 12/06/20 0630 12/07/20 1003 12/08/20 0227 12/09/20 0905  WBC 10.1   < > 7.3 7.4  6.5 8.0 4.9  NEUTROABS 7.4  --   --   --  4.3 5.7 2.8  HGB 10.9*   < > 7.8* 8.0* 7.9* 7.6* 7.9*  HCT 34.4*   < > 25.0* 26.0* 24.8* 23.9* 24.9*  MCV 92.0   < > 92.9 94.2 91.9 91.9 94.0  PLT 309   < > 168 165 171 177 215   < > = values in this interval not displayed.    Basic Metabolic Panel: Recent Labs  Lab 12/04/20 0440 12/05/20 0345 12/06/20 0630 12/07/20 0520 12/08/20 0227 12/09/20 0905  NA 138 137 137 136 133* 135  K 3.2* 4.3 3.5 4.1 4.2 4.3  CL 109 109 109 106 107 108  CO2 24 24 23  21* 22 22  GLUCOSE 90 72 75 163* 233* 307*  BUN 23* 22* 25* 24* 28* 35*  CREATININE 1.74* 2.14* 1.91* 1.66* 1.64* 1.77*  CALCIUM 7.4* 7.5* 7.7* 8.4* 7.9* 8.2*  MG 1.3* 2.1  --  1.8 1.6* 2.2  PHOS  --  3.2  --   --  4.9* 4.2    GFR: Estimated Creatinine Clearance: 48.7 mL/min (A) (by C-G formula based on SCr of 1.77 mg/dL (H)). Liver Function Tests: Recent Labs  Lab 12/03/20 0516 12/05/20 0345 12/08/20 0227 12/09/20 0905  AST 27 13* 22 27  ALT 11 8 10 16   ALKPHOS 155* 105 94 92  BILITOT 1.1 0.6 0.5 0.5  PROT 7.4 5.2* 5.5* 5.9*  ALBUMIN 2.5* 1.5* 1.7* 1.8*    No results for input(s): LIPASE, AMYLASE in the last 168 hours. No results for input(s): AMMONIA in the last 168 hours. Coagulation Profile: Recent Labs  Lab 12/03/20 0847  INR 1.3*    Cardiac Enzymes: Recent Labs  Lab 12/03/20 0516  CKTOTAL 93    BNP (last 3 results) No results for input(s): PROBNP in the last 8760 hours. HbA1C: No results for input(s): HGBA1C in the last 72 hours. CBG: Recent Labs  Lab 12/08/20 1533 12/08/20 2205 12/09/20 0804 12/09/20 1257 12/09/20 1625  GLUCAP 93 112* 282* 154* 116*    Lipid Profile: No results for input(s): CHOL, HDL, LDLCALC, TRIG, CHOLHDL, LDLDIRECT in the last 72 hours.  Thyroid Function Tests: No results for input(s): TSH, T4TOTAL, FREET4, T3FREE, THYROIDAB in the last 72 hours. Anemia Panel: Recent Labs    12/08/20 0227 12/09/20 0905  FERRITIN 470*  460*    Sepsis Labs: Recent Labs  Lab 12/03/20 0630  LATICACIDVEN 1.0     Recent Results (  from the past 240 hour(s))  MRSA Next Gen by PCR, Nasal     Status: None   Collection Time: 12/03/20  1:56 PM   Specimen: Nasal Mucosa; Nasal Swab  Result Value Ref Range Status   MRSA by PCR Next Gen NOT DETECTED NOT DETECTED Final    Comment: (NOTE) The GeneXpert MRSA Assay (FDA approved for NASAL specimens only), is one component of a comprehensive MRSA colonization surveillance program. It is not intended to diagnose MRSA infection nor to guide or monitor treatment for MRSA infections. Test performance is not FDA approved in patients less than 7 years old. Performed at [facility], [place] [address] [place], [place] [address]   SARS CORONAVIRUS 2 (TAT 6-24 HRS) Nasopharyngeal Nasopharyngeal Swab     Status: Abnormal   Collection Time: 12/04/20  3:03 PM   Specimen: Nasopharyngeal Swab  Result Value Ref Range Status   SARS Coronavirus 2 POSITIVE (A) NEGATIVE Final    Comment: (NOTE) SARS-CoV-2 target nucleic acids are DETECTED.  The SARS-CoV-2 RNA is generally detectable in upper and lower respiratory specimens during the acute phase of infection. Positive results are indicative of the presence of SARS-CoV-2 RNA. Clinical correlation with patient history and other diagnostic information is  necessary to determine patient infection status. Positive results do not rule out bacterial infection or co-infection with other viruses.  The expected result is Negative.  Fact Sheet for Patients: SugarRoll.be  Fact Sheet for Healthcare Providers: https://www.woods-mathews.com/  This test is not yet approved or cleared by the [place] FDA and  has been authorized for detection and/or diagnosis of SARS-CoV-2 by FDA under an Emergency Use Authorization (EUA). This EUA will remain  in effect (meaning this test can be used) for the duration  of the COVID-19 declaration under Section 564(b)(1) of the Act, 21 U. S.C. section 360bbb-3(b)(1), unless the authorization is terminated or revoked sooner.   Performed at [facility], [place] [address] [place], [place] [address]       RN Pressure Injury Documentation:     Estimated body mass index is 25.97 kg/m as calculated from the following:   Height as of this encounter: 5' 7"  (1.702 m).   Weight as of this encounter: 75.2 kg.  Malnutrition Type:  Nutrition Problem: Moderate Malnutrition Etiology: chronic illness (gastroparesis, chronic nausea/vomiting, uncontrolled T1DM)  Malnutrition Characteristics:  Signs/Symptoms: mild fat depletion, moderate muscle depletion  Nutrition Interventions:  Interventions: Boost Breeze, Prostat   Radiology Studies: DG CHEST PORT 1 VIEW  Result Date: 12/09/2020 CLINICAL DATA:  Shortness of breath. EXAM: PORTABLE CHEST 1 VIEW COMPARISON:  December 08, 2020 FINDINGS: Right PICC line in stable position. Borderline enlarged cardiac silhouette. Mediastinal contours appear intact. There is no evidence of pleural effusion or pneumothorax. Mild peribronchial airspace consolidation, left greater than right lung base. Osseous structures are without acute abnormality. Soft tissues are grossly normal. IMPRESSION: Mild peribronchial airspace consolidation, left greater than right lung base may represent atypical pneumonia. Electronically Signed   By: Fidela Salisbury M.D.   On: 12/09/2020 10:47   DG CHEST PORT 1 VIEW  Result Date: 12/08/2020 CLINICAL DATA:  Back pain. Altered mental status. COVID-19 positive. EXAM: PORTABLE CHEST 1 VIEW COMPARISON:  12/03/2020 FINDINGS: Right-sided PICC line tip at superior caval/atrial junction. Interval extubation. Midline trachea. Borderline cardiomegaly. No pleural effusion or pneumothorax. Left greater than right interstitial coarsening without well-defined lobar consolidation. IMPRESSION: Bilateral  interstitial coarsening is favored to be chronic. Especially in the left upper lobe,  mild COVID-19 pneumonia could look similar. Electronically Signed   By: Abigail Miyamoto M.D.   On: 12/08/2020 10:32   VAS [place] LOWER EXTREMITY VENOUS (DVT)  Result Date: 12/09/2020  Lower Venous DVT Study Patient Name:  [name]  Date of Exam:   12/09/2020 Medical Rec #: [id number]       Accession #:    [id number] Date of Birth: [birth year]       Patient Gender: F Patient Age:   6 years Exam Location:  [facility] Procedure:      VAS [place] LOWER EXTREMITY VENOUS (DVT) Referring Phys: Raiford Noble --------------------------------------------------------------------------------  Indications: Covid, D-Dimer >20, edema.  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 Comparison Study: Prior negative bilateral LEV done 05/17/2020 Performing Technologist: Sharion Dove RVS  Examination Guidelines: A complete evaluation includes B-mode imaging, spectral Doppler, color Doppler, and power Doppler as needed of all accessible portions of each vessel. Bilateral testing is considered an integral part of a complete examination. Limited examinations for reoccurring indications may be performed as noted. The reflux portion of the exam is performed with the patient in reverse Trendelenburg.  +---------+---------------+---------+-----------+----------+-------------------+ RIGHT    CompressibilityPhasicitySpontaneityPropertiesThrombus Aging      +---------+---------------+---------+-----------+----------+-------------------+ CFV      Full                                         pulsatile waveforms +---------+---------------+---------+-----------+----------+-------------------+ SFJ      Full                                                             +---------+---------------+---------+-----------+----------+-------------------+ FV Prox  Full                                                              +---------+---------------+---------+-----------+----------+-------------------+ FV Mid   Full                                                             +---------+---------------+---------+-----------+----------+-------------------+ FV DistalFull                                                             +---------+---------------+---------+-----------+----------+-------------------+ PFV      Full                                                             +---------+---------------+---------+-----------+----------+-------------------+ POP      Full  Yes      Yes                                      +---------+---------------+---------+-----------+----------+-------------------+ PTV      Full                                                             +---------+---------------+---------+-----------+----------+-------------------+ PERO     Full                                                             +---------+---------------+---------+-----------+----------+-------------------+   +---------+---------------+---------+-----------+----------+-------------------+ LEFT     CompressibilityPhasicitySpontaneityPropertiesThrombus Aging      +---------+---------------+---------+-----------+----------+-------------------+ CFV      Full                                         pulsatile waveforms +---------+---------------+---------+-----------+----------+-------------------+ SFJ      Full                                                             +---------+---------------+---------+-----------+----------+-------------------+ FV Prox  Full                                                             +---------+---------------+---------+-----------+----------+-------------------+ FV Mid   Full                                                             +---------+---------------+---------+-----------+----------+-------------------+ FV  DistalFull                                                             +---------+---------------+---------+-----------+----------+-------------------+ PFV      Full                                                             +---------+---------------+---------+-----------+----------+-------------------+ POP      Full           Yes  Yes                                      +---------+---------------+---------+-----------+----------+-------------------+ PTV      Full                                                             +---------+---------------+---------+-----------+----------+-------------------+ PERO     Full                                                             +---------+---------------+---------+-----------+----------+-------------------+    Summary: BILATERAL: - No evidence of deep vein thrombosis seen in the lower extremities, bilaterally. - RIGHT: - A cystic structure is found in the popliteal fossa. interstitial edema noted throughout  LEFT: - A cystic structure is found in the popliteal fossa. Interstitial edema noted throughout.  *See table(s) above for measurements and observations.    Preliminary     Scheduled Meds:  (feeding supplement) PROSource Plus  30 mL Oral BID BM   amLODipine  10 mg Oral Daily   vitamin C  500 mg Oral Daily   busPIRone  5 mg Oral TID   carvedilol  12.5 mg Oral BID WC   Chlorhexidine Gluconate Cloth  6 each Topical Daily   feeding supplement  1 Container Oral BID BM   gabapentin  200 mg Oral TID   guaiFENesin  1,200 mg Oral BID   hydrALAZINE  100 mg Oral Q8H   insulin aspart  0-5 Units Subcutaneous QHS   insulin aspart  0-9 Units Subcutaneous TID WC   insulin aspart  2 Units Subcutaneous TID WC   insulin glargine-yfgn  3 Units Subcutaneous Daily   levETIRAcetam  500 mg Oral BID   LORazepam  0.5 mg Oral BID   metoCLOPramide  5 mg Oral TID AC   nirmatrelvir/ritonavir EUA (renal dosing)  2 tablet Oral BID    sodium chloride flush  10-40 mL Intracatheter Q12H   zinc sulfate  220 mg Oral Daily   Continuous Infusions:  sodium chloride Stopped (12/07/20 1228)   heparin 1,300 Units/hr (12/09/20 1332)    LOS: 6 days   Kerney Elbe, DO [facility] PAGER is on AMION  If 7PM-7AM, please contact night-coverage [email or web address]

## 2020-12-09 NOTE — Progress Notes (Signed)
VASCULAR LAB    Bilateral lower extremity venous duplex has been performed.  See CV proc for preliminary results.   Deaglan Lile, RVT 12/09/2020, 4:41 PM

## 2020-12-09 NOTE — Progress Notes (Signed)
Attempted to get blood return from picc for lab draw, writer was unsuccessful. Put in for IV consult to attempt.

## 2020-12-09 NOTE — Progress Notes (Signed)
ANTICOAGULATION CONSULT NOTE - Initial Consult  Pharmacy Consult for Heparin Indication:  VTE  Allergies  Allergen Reactions   Other Anaphylaxis    Reaction to [place] nuts    Lactose Intolerance (Gi) Diarrhea    Patient Measurements: Height: 5' 7"  (170.2 cm) Weight: 75.2 kg (165 lb 12.6 oz) IBW/kg (Calculated) : 61.6 Heparin Dosing Weight: 75.2 kg  Vital Signs: Temp: 97.8 F (36.6 C) (09/11 1002) Temp Source: Oral (09/11 1002) BP: 169/93 (09/11 1002) Pulse Rate: 87 (09/11 1002)  Labs: Recent Labs    12/07/20 0520 12/07/20 1003 12/07/20 1003 12/08/20 0227 12/09/20 0905  HGB  --  7.9*   < > 7.6* 7.9*  HCT  --  24.8*  --  23.9* 24.9*  PLT  --  171  --  177 215  CREATININE 1.66*  --   --  1.64* 1.77*   < > = values in this interval not displayed.    Estimated Creatinine Clearance: 48.7 mL/min (A) (by C-G formula based on SCr of 1.77 mg/dL (H)).   Medical History: Past Medical History:  Diagnosis Date   Allergy    Anemia    Anxiety    Blood transfusion without reported diagnosis    Dec 2018   Cataract    right eye   COVID-19 03/2020   Depression    Diabetes type 1, uncontrolled ([place]) 11/14/2011   Since age 30   Fibromyalgia    Gastroparesis    GERD (gastroesophageal reflux disease)    Hypertension    Infection    UTI April 2016    Medications:  Scheduled:   (feeding supplement) PROSource Plus  30 mL Oral BID BM   amLODipine  10 mg Oral Daily   vitamin C  500 mg Oral Daily   busPIRone  5 mg Oral TID   carvedilol  12.5 mg Oral BID WC   Chlorhexidine Gluconate Cloth  6 each Topical Daily   feeding supplement  1 Container Oral BID BM   gabapentin  200 mg Oral TID   guaiFENesin  1,200 mg Oral BID   hydrALAZINE  100 mg Oral Q8H   insulin aspart  0-5 Units Subcutaneous QHS   insulin aspart  0-9 Units Subcutaneous TID WC   insulin aspart  2 Units Subcutaneous TID WC   insulin glargine-yfgn  3 Units Subcutaneous Daily   levETIRAcetam  500 mg Oral BID    LORazepam  0.5 mg Oral BID   metoCLOPramide  5 mg Oral TID AC   nirmatrelvir/ritonavir EUA (renal dosing)  2 tablet Oral BID   sodium chloride flush  10-40 mL Intracatheter Q12H   zinc sulfate  220 mg Oral Daily   Infusions:   sodium chloride Stopped (12/07/20 1228)    Assessment: 30 YO female with COVID who presented with encephalopathy and history of chronic nausea and vomiting. She had severe hypertension, hypokalemia, anion gap of 18, and PRESS. Her D-dimer increased from 2.44 to >20 in about 24 hours. She was on [place] heparin for DVT ppx until ~0530 9/11. After discussing with Dr. Alfredia Ferguson, worried about possible VTE, but unable to get a CT due to poor renal function and difficulty with imaging. Given renal function and high suspicion of clot, will give a lower range bolus (62.5 units/kg) and start VTE treatment dose.  Goal of Therapy:  Heparin level 0.3-0.7 units/ml Monitor platelets by anticoagulation protocol: Yes   Plan:  Give 4700 units bolus x 1 Start heparin infusion at 1300 units/hr Check  anti-Xa level in 6 hours and daily while on heparin Continue to monitor H&H and platelets Follow up with appropriateness of therapy. Monitor for signs and symptoms of bleeding.   Varney Daily, PharmD PGY1 Pharmacy Resident  Please check AMION for all [facility] pharmacy phone numbers After 10:00 PM call main pharmacy [phone]

## 2020-12-10 ENCOUNTER — Other Ambulatory Visit: Payer: Self-pay

## 2020-12-10 ENCOUNTER — Inpatient Hospital Stay (HOSPITAL_COMMUNITY): Payer: Medicaid Other

## 2020-12-10 DIAGNOSIS — R569 Unspecified convulsions: Secondary | ICD-10-CM | POA: Diagnosis not present

## 2020-12-10 DIAGNOSIS — R410 Disorientation, unspecified: Secondary | ICD-10-CM | POA: Diagnosis not present

## 2020-12-10 DIAGNOSIS — R451 Restlessness and agitation: Secondary | ICD-10-CM | POA: Diagnosis not present

## 2020-12-10 DIAGNOSIS — N179 Acute kidney failure, unspecified: Secondary | ICD-10-CM | POA: Diagnosis not present

## 2020-12-10 LAB — CBC WITH DIFFERENTIAL/PLATELET
Abs Immature Granulocytes: 0.06 10*3/uL (ref 0.00–0.07)
Basophils Absolute: 0.1 10*3/uL (ref 0.0–0.1)
Basophils Relative: 1 %
Eosinophils Absolute: 0.4 10*3/uL (ref 0.0–0.5)
Eosinophils Relative: 7 %
HCT: 22.1 % — ABNORMAL LOW (ref 36.0–46.0)
Hemoglobin: 7 g/dL — ABNORMAL LOW (ref 12.0–15.0)
Immature Granulocytes: 1 %
Lymphocytes Relative: 27 %
Lymphs Abs: 1.5 10*3/uL (ref 0.7–4.0)
MCH: 29.7 pg (ref 26.0–34.0)
MCHC: 31.7 g/dL (ref 30.0–36.0)
MCV: 93.6 fL (ref 80.0–100.0)
Monocytes Absolute: 0.6 10*3/uL (ref 0.1–1.0)
Monocytes Relative: 10 %
Neutro Abs: 3 10*3/uL (ref 1.7–7.7)
Neutrophils Relative %: 54 %
Platelets: 189 10*3/uL (ref 150–400)
RBC: 2.36 MIL/uL — ABNORMAL LOW (ref 3.87–5.11)
RDW: 14.1 % (ref 11.5–15.5)
WBC: 5.5 10*3/uL (ref 4.0–10.5)
nRBC: 0 % (ref 0.0–0.2)

## 2020-12-10 LAB — COMPREHENSIVE METABOLIC PANEL
ALT: 22 U/L (ref 0–44)
AST: 34 U/L (ref 15–41)
Albumin: 1.7 g/dL — ABNORMAL LOW (ref 3.5–5.0)
Alkaline Phosphatase: 96 U/L (ref 38–126)
Anion gap: 5 (ref 5–15)
BUN: 42 mg/dL — ABNORMAL HIGH (ref 6–20)
CO2: 20 mmol/L — ABNORMAL LOW (ref 22–32)
Calcium: 7.9 mg/dL — ABNORMAL LOW (ref 8.9–10.3)
Chloride: 106 mmol/L (ref 98–111)
Creatinine, Ser: 1.93 mg/dL — ABNORMAL HIGH (ref 0.44–1.00)
GFR, Estimated: 35 mL/min — ABNORMAL LOW (ref >60)
Glucose, Bld: 377 mg/dL — ABNORMAL HIGH (ref 70–99)
Potassium: 4.6 mmol/L (ref 3.5–5.1)
Sodium: 131 mmol/L — ABNORMAL LOW (ref 135–145)
Total Bilirubin: 0.3 mg/dL (ref 0.3–1.2)
Total Protein: 5.5 g/dL — ABNORMAL LOW (ref 6.5–8.1)

## 2020-12-10 LAB — GLUCOSE, CAPILLARY
Glucose-Capillary: 298 mg/dL — ABNORMAL HIGH (ref 70–99)
Glucose-Capillary: 144 mg/dL — ABNORMAL HIGH (ref 70–99)
Glucose-Capillary: 156 mg/dL — ABNORMAL HIGH (ref 70–99)

## 2020-12-10 LAB — LACTATE DEHYDROGENASE: LDH: 198 U/L — ABNORMAL HIGH (ref 98–192)

## 2020-12-10 LAB — SEDIMENTATION RATE: Sed Rate: 103 mm/hr — ABNORMAL HIGH (ref 0–22)

## 2020-12-10 LAB — MAGNESIUM: Magnesium: 2.2 mg/dL (ref 1.7–2.4)

## 2020-12-10 LAB — PHOSPHORUS: Phosphorus: 4.9 mg/dL — ABNORMAL HIGH (ref 2.5–4.6)

## 2020-12-10 LAB — C-REACTIVE PROTEIN: CRP: 0.6 mg/dL (ref <1.0)

## 2020-12-10 LAB — D-DIMER, QUANTITATIVE: D-Dimer, Quant: 1.74 ug/mL-FEU — ABNORMAL HIGH (ref 0.00–0.50)

## 2020-12-10 LAB — FERRITIN: Ferritin: 12 ng/mL (ref 11–307)

## 2020-12-10 LAB — HEPARIN LEVEL (UNFRACTIONATED): Heparin Unfractionated: 0.36 IU/mL (ref 0.30–0.70)

## 2020-12-10 LAB — FIBRINOGEN: Fibrinogen: 553 mg/dL — ABNORMAL HIGH (ref 210–475)

## 2020-12-10 IMAGING — NM NM PULMONARY PERF PARTICULATE
8 series · 8 of 8 positions shown · non-contrast
Comparison: None.

CLINICAL DATA: PE suspected, low/intermediate prob, positive
D-dimer, COVID+ with D-Dimer >20.00

EXAM:
NUCLEAR MEDICINE PERFUSION LUNG SCAN
TECHNIQUE: Perfusion images were obtained in multiple projections after
intravenous injection of radiopharmaceutical.
Ventilation scans intentionally deferred if perfusion scan and chest
x-ray adequate for interpretation during COVID 19 epidemic.
RADIOPHARMACEUTICALS:  4.0 mCi [FW] MAA IV

[Series 1: ant/post perf · 4.14mm/px · 1 of 1 slices shown (1 of 2)]
[im 1/1]
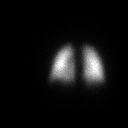

[Series 1: ant/post perf · 4.14mm/px · 1 of 1 slices shown (2 of 2)]
[im 1/1]
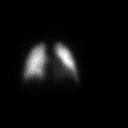

[Series 2: lao/rpo perf · 4.14mm/px · 1 of 1 slices shown (1 of 2)]
[im 1/1]
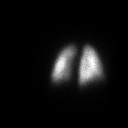

[Series 2: lao/rpo perf · 4.14mm/px · 1 of 1 slices shown (2 of 2)]
[im 1/1]
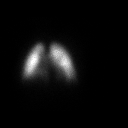

[Series 3: lpo/rao perf · 4.14mm/px · 1 of 1 slices shown (1 of 2)]
[im 1/1]
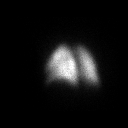

[Series 3: lpo/rao perf · 4.14mm/px · 1 of 1 slices shown (2 of 2)]
[im 1/1]
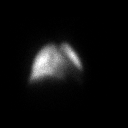

[Series 4: lt lat/rt lat perf · 4.14mm/px · 1 of 1 slices shown (1 of 2)]
[im 1/1]
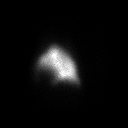

[Series 4: lt lat/rt lat perf · 4.14mm/px · 1 of 1 slices shown (2 of 2)]
[im 1/1]
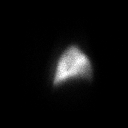

[8 of 8 positions shown; findings below may reference images not displayed]

FINDINGS: There is a normal distribution of radiotracer throughout the lungs
bilaterally. No segmental perfusion defects are identified to
suggest acute pulmonary embolism.
IMPRESSION: Normal examination.  No pulmonary embolism.

## 2020-12-10 IMAGING — DX DG CHEST 1V PORT
1 series · 1 of 1 positions shown · non-contrast
Comparison: [DATE] and CT chest [DATE].

CLINICAL DATA: Shortness of breath, COVID.

EXAM:
PORTABLE CHEST 1 VIEW

[chest]
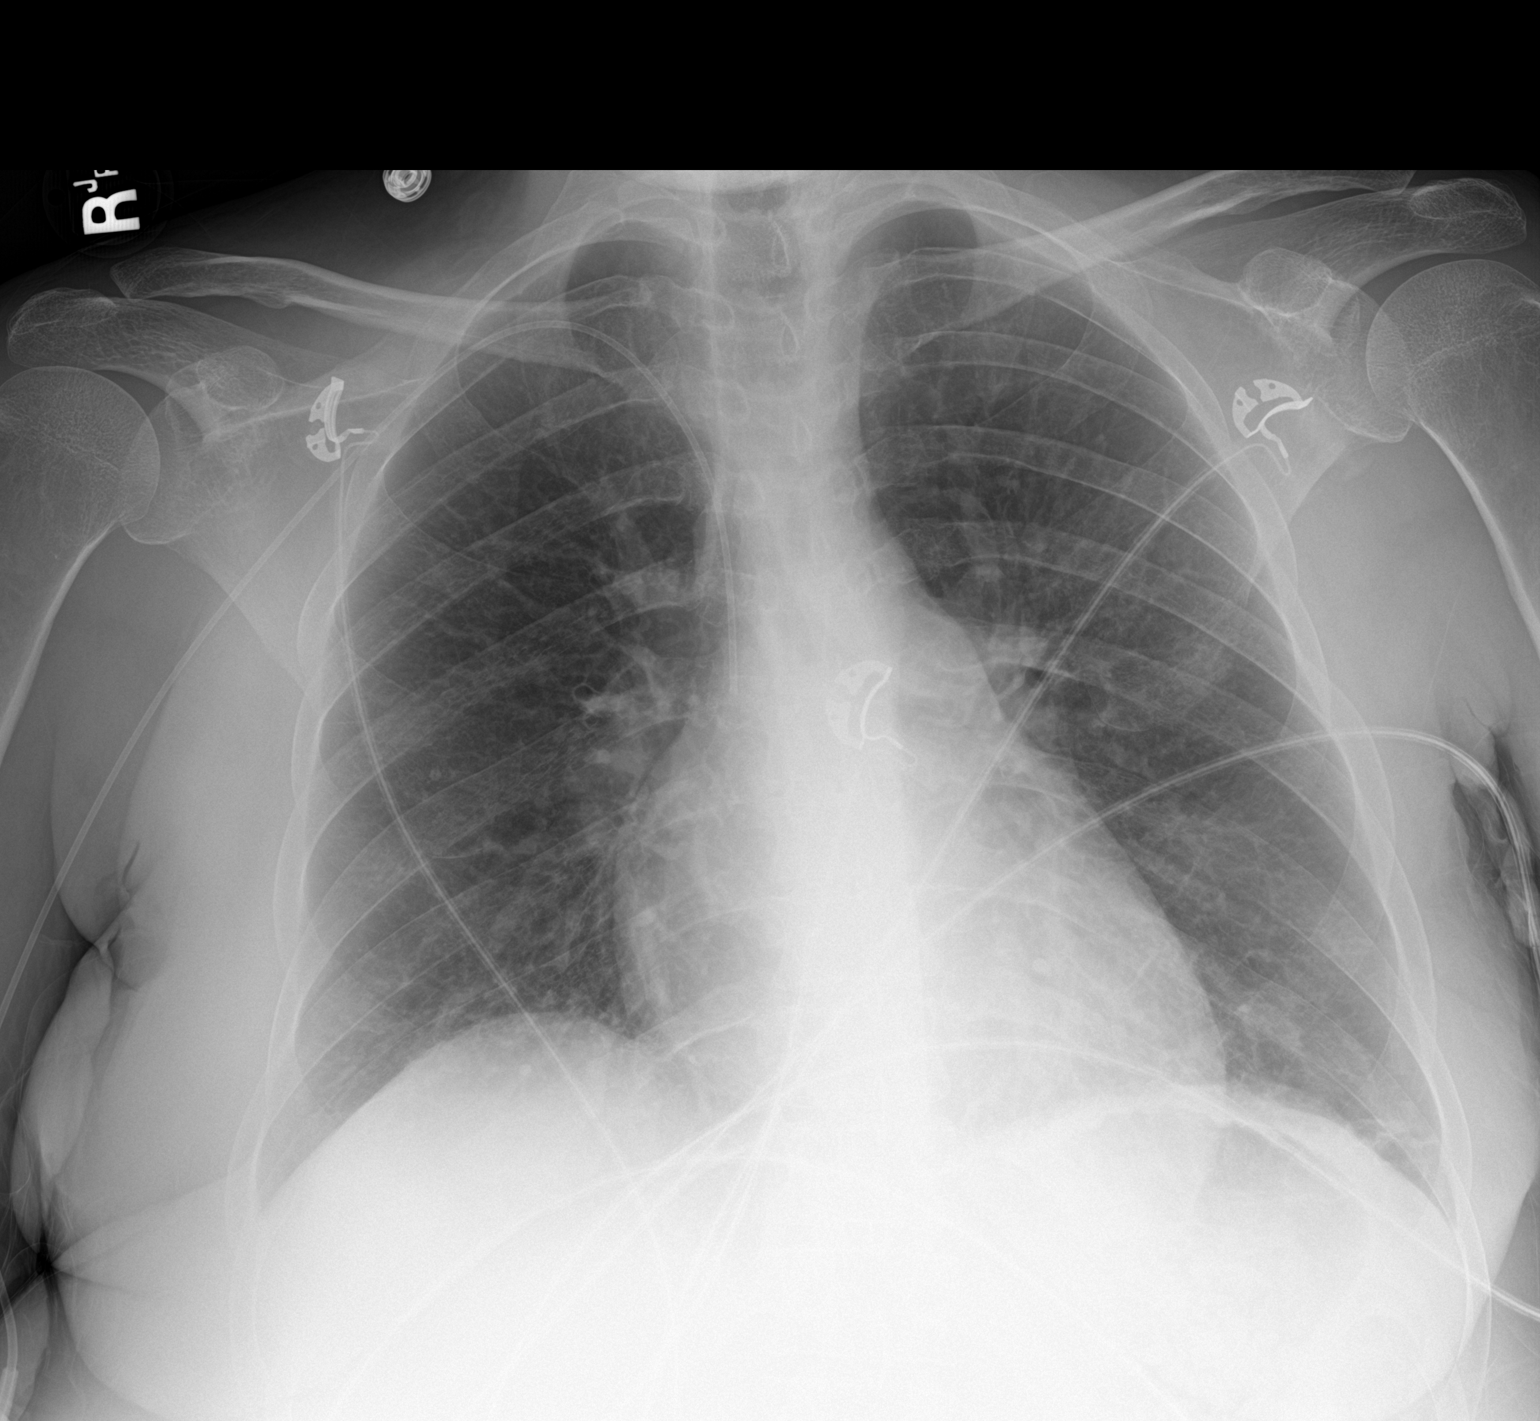

[1 of 1 positions shown; findings below may reference images not displayed]

FINDINGS: Trachea is midline. Heart size is accentuated by AP technique. Right
PICC tip is in the low SVC. Minimal streaky opacification in the
left lung base. Lungs are otherwise clear. No pleural fluid.
IMPRESSION: Minimal streaky atelectasis in the left lung base.

## 2020-12-10 MED ORDER — GUAIFENESIN ER 600 MG PO TB12: 600.0000 mg | ORAL_TABLET | Freq: Two times a day (BID) | ORAL | 0 refills | Status: DC

## 2020-12-10 MED ORDER — NIRMATRELVIR/RITONAVIR (PAXLOVID) TABLET (RENAL DOSING): 2.0000 | ORAL_TABLET | Freq: Two times a day (BID) | ORAL | 0 refills | Status: DC

## 2020-12-10 MED ORDER — INSULIN GLARGINE 100 UNIT/ML ~~LOC~~ SOLN: 5.0000 [IU] | Freq: Every day | SUBCUTANEOUS | 11 refills | Status: AC

## 2020-12-10 MED ORDER — INSULIN GLARGINE-YFGN 100 UNIT/ML ~~LOC~~ SOLN: 5.0000 [IU] | Freq: Every day | SUBCUTANEOUS | Status: DC

## 2020-12-10 MED ORDER — ASCORBIC ACID 500 MG PO TABS: 500.0000 mg | ORAL_TABLET | Freq: Every day | ORAL | 0 refills | Status: DC

## 2020-12-10 MED ORDER — LEVETIRACETAM 500 MG PO TABS: 500.0000 mg | ORAL_TABLET | Freq: Two times a day (BID) | ORAL | 0 refills | Status: AC

## 2020-12-10 MED ORDER — ZINC SULFATE 220 (50 ZN) MG PO CAPS: 220.0000 mg | ORAL_CAPSULE | Freq: Every day | ORAL | 0 refills | Status: AC

## 2020-12-10 MED ORDER — OXYCODONE-ACETAMINOPHEN 10-325 MG PO TABS: 1.0000 | ORAL_TABLET | ORAL | 0 refills | Status: DC | PRN

## 2020-12-10 MED ORDER — IPRATROPIUM-ALBUTEROL 20-100 MCG/ACT IN AERS: 1.0000 | INHALATION_SPRAY | Freq: Four times a day (QID) | RESPIRATORY_TRACT | 0 refills | Status: AC | PRN

## 2020-12-10 MED ORDER — TECHNETIUM TO 99M ALBUMIN AGGREGATED
4.0000 | Freq: Once | INTRAVENOUS | Status: AC | PRN
  Administered 2020-12-10: 4 via INTRAVENOUS

## 2020-12-10 MED ORDER — HEPARIN BOLUS VIA INFUSION
2500.0000 [IU] | Freq: Once | INTRAVENOUS | Status: AC
  Administered 2020-12-10: 2500 [IU] via INTRAVENOUS
  Filled 2020-12-10: qty 2500

## 2020-12-10 MED ORDER — PROSOURCE PLUS PO LIQD: 30.0000 mL | Freq: Two times a day (BID) | ORAL | 0 refills | Status: AC

## 2020-12-10 MED ORDER — INSULIN ASPART 100 UNIT/ML IJ SOLN
3.0000 [IU] | Freq: Three times a day (TID) | INTRAMUSCULAR | Status: DC
  Administered 2020-12-10: 3 [IU] via SUBCUTANEOUS

## 2020-12-10 NOTE — Progress Notes (Signed)
PICC removed per order.  Vaseline gauze and gauze pressure dressing applied and manual pressure held x 5 minutes.  Patient instructed to notify bedside RN if bleeding occurs.  Patient instructed to leave dressing on for 24 hours, do not get wet, what to do if bleeding occurs once home.  Patient and Justice Rocher, RN aware that out of bed time is 2000.

## 2020-12-10 NOTE — Telephone Encounter (Signed)
Request sent to Dr. Jerline Pain for approval.

## 2020-12-10 NOTE — TOC Initial Note (Signed)
Transition of Care [facility]) - Initial/Assessment Note    Patient Details  Name: [name] MRN: [id number] Date of Birth: Jul 08, 1990  [facility] of [facility] CM/SW Contact:    Joanne Chars, LCSW Phone Number: 12/10/2020, 11:18 AM  Clinical Narrative:     CSW spoke with pt by phone for assessment due to covid +.  Pt agreeable to [facility] services, choice document will be taken to pt by RN.  Permission given to speak with husband [name].  PCP in place. Current DME in home: wheelchair, walker, crutches, 3n1.  Discussed readmission risk items and husband provides transport to all appts.                Expected Discharge Plan: [place] Barriers to Discharge: Continued Medical Work up   Patient Goals and CMS Choice Patient states their goals for this hospitalization and ongoing recovery are:: being able to walk, pick my daughter up from school CMS Medicare.gov Compare Post Acute Care list provided to:: Patient Choice offered to / list presented to : Patient  Expected Discharge Plan and Services Expected Discharge Plan: [place] Choice: [place] arrangements for the past 2 months: Single Family Home                                      Prior Living Arrangements/Services Living arrangements for the past 2 months: Single Family Home Lives with:: Spouse Patient language and need for interpreter reviewed:: Yes        Need for Family Participation in Patient Care: No (Comment) Care giver support system in place?: Yes (comment) Current home services: Other (comment) (none) Criminal Activity/Legal Involvement Pertinent to Current Situation/Hospitalization: No - Comment as needed  Activities of Daily Living      Permission Sought/Granted Permission sought to share information with : Family Supports Permission granted to share information with : Yes, Verbal Permission Granted  Share Information with NAME:  husband [name]  Permission granted to share info w AGENCY: HH        Emotional Assessment Appearance:: Other (Comment Required (unknown: phone assessment due to covid +) Attitude/Demeanor/Rapport: Engaged Affect (typically observed): Appropriate, Pleasant Orientation: : Oriented to Self, Oriented to Place, Oriented to  Time, Oriented to Situation Alcohol / Substance Use: Not Applicable Psych Involvement: No (comment)  Admission diagnosis:  Respiratory failure ([place]) [J96.90] Delirium [R41.0] Seizure ([place]) [R56.9] Agitation [R45.1] Hypertensive emergency [I16.1] Patient Active Problem List   Diagnosis Date Noted   Agitation    Malnutrition of moderate degree 12/04/2020   Seizure ([place])    Delirium    Hypertensive emergency    PRES (posterior reversible encephalopathy syndrome)    Respiratory failure ([place]) 12/03/2020   Type 1 diabetes mellitus with ketoacidosis without coma ([place]) 11/15/2020   Insomnia 11/15/2020   Iron deficiency 04/11/2020   Osteomyelitis ([place]) 04/10/2020   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 ([place]) 02/03/2020   AKI (acute kidney injury) ([place]) 09/29/2019   Cannabinoid hyperemesis syndrome 04/30/2019   Contraception management 08/06/2018   Diabetic gastroparesis associated with type 1 diabetes mellitus ([place]) 06/04/2018   Anxiety 05/31/2018   Peripheral edema 05/31/2018   Chronic anemia 07/10/2017   GERD (gastroesophageal reflux disease) 06/23/2017   MDD (major depressive disorder), recurrent episode, mild ([place]) 02/28/2017   Severe protein-calorie malnutrition ([place]) 04/28/2013  Hypertension associated with diabetes ([place]) 11/14/2011   PCP:  [name], MD Pharmacy:   [facility] [place] [place] [address] Phone: [phone] Fax: [phone]     Social Determinants of Health (SDOH) Interventions    Readmission Risk  Interventions Readmission Risk Prevention Plan 03/22/2020 09/26/2019 07/30/2018  Transportation Screening Complete Complete Complete  PCP or Specialist Appt within 3-5 Days - - Complete  HRI or Home Care Consult - - Not Complete  HRI or Home Care Consult comments - - NA  Social Work Consult for [place] Planning/Counseling - - Not Complete  SW consult not completed comments - - NA  Palliative Care Screening - - Not Applicable  Medication Review Press photographer) Complete - (No Data)  PCP or Specialist appointment within 3-5 days of discharge Complete Complete -  [place] or Home Care Consult Complete Complete -  SW Recovery Care/Counseling Consult Complete Complete -  Palliative Care Screening Not Applicable Not Applicable -  [place] Not Applicable Not Applicable -  Some recent data might be hidden

## 2020-12-10 NOTE — Progress Notes (Signed)
Inpatient Diabetes Program Recommendations  AACE/ADA: New Consensus Statement on Inpatient Glycemic Control   Target Ranges:  Prepandial:   less than 140 mg/dL      Peak postprandial:   less than 180 mg/dL (1-2 hours)      Critically ill patients:  140 - 180 mg/dL   Results for [name], [name] (MRN [id number]) as of 12/10/2020 09:28  Ref. Range 12/09/2020 08:04 12/09/2020 12:57 12/09/2020 16:25 12/09/2020 21:08 12/10/2020 08:05  Glucose-Capillary Latest Ref Range: 70 - 99 mg/dL 282 (H) 154 (H) 116 (H) 168 (H) 298 (H)   Review of Glycemic Control  Diabetes history: DM1 (makes NO insulin; requires basal, correction, and carbohydrate coverage insulin Outpatient Diabetes medications: Lantus 8 units daily, Humalog 0-5 units TID with meals Current orders for Inpatient glycemic control: Semglee 3 units daily, Novolog 0-9 units TID, Novolog 0-5 units QHS, Novolog 2 units TID with meals  Inpatient Diabetes Program Recommendations:    Insulin: Please consider increasing Semglee to 5 units daily and meal coverage to Novolog 3 units TID with meals.  Thanks, Barnie Alderman, RN, MSN, CDE Diabetes Coordinator Inpatient Diabetes Program [phone] (Team Pager from 8am to 5pm)

## 2020-12-10 NOTE — Telephone Encounter (Signed)
LAST APPOINTMENT DATE:  11/15/20  NEXT APPOINTMENT DATE: None  MEDICATION:oxyCODONE-acetaminophen (PERCOCET) 10-325 MG tablet   PHARMACY:[id number] - [place], [place] [address]

## 2020-12-10 NOTE — Discharge Summary (Signed)
Physician Discharge Summary  [name]:[id number] DOB: 07-09-1990 DOA: 12/03/2020  PCP: [name], MD  Admit date: 12/03/2020 Discharge date: 12/10/2020  Admitted From: Home Disposition: Home with Home Health  Recommendations for Outpatient Follow-up:  Follow up with PCP in 1-2 weeks Follow up with [facility] within 1-2 weeks Follow up with Neurology within 1-2 weeks  Please obtain CMP/CBC, Mag, Phos in one week Please follow up on the following pending results:  Home Health: No  Equipment/Devices: None    Discharge Condition: Stable  CODE STATUS: FULL CODE  Diet recommendation: Heart Healthy Carb Modified diet  Brief/Interim Summary: The patient is a 30 year old African-American female with past medical history significant for multiple chronical medical conditions including uncontrolled diabetes mellitus type 1, anxiety, chronic nausea vomiting, 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  She was brought to the ED and had some flexor posturing prior to arrival and was intubated the ED for combativeness.  Has chronic nausea vomiting in the setting of gastroparesis and a normal gastric emptying study 2021.  She started having some intractable nausea vomiting and woke up and husband felt that she seemed out of it and did not really acknowledge him.  She also did not recognize a photo of the daughter.  She came to the emergency room and at the door she is on done and was described to have some flexor posturing of her extremities last about 60 seconds.  Thereafter she started becoming hysterical and started screaming.  She is very combative and was intubated.  Labs are notable to be elevated with an anion gap of 18 and normal lactate.  She is hypokalemic.  She had an elevated creatinine but has chronic  kidney disease.  CT of the abdomen pelvis was done with anasarca or ascites.  CT of the chest revealed bibasilar atelectasis and faint groundglass opacities.  Neurology was consulted and she was transferred to [facility].  She ended up getting a stat MRI and spot EEG.  MRI was consistent with press and EEG was consistent with excessive beta and continuous slow with severe encephalopathy likely related to sedation.  Her blood pressures been elevated and she had as needed labetalol added.  Blood pressure is improving and she is improved from a neurological standpoint.  Her seizure-like activity witnessed in the ED and neurology following.  They are recommending continue home Ativan and AEDs with Keppra 500 mg every 12 and tapering off gradually.  She was transferred to the hospital service on 12/07/2020.  After further discussion she had inflammatory markers was obtained and chest x-ray showed some mild COVID disease so she was started on Paxlovid. D-Dimer was significantly elevated today (>20.00) so will get empirically start her on a Heparin gtt. unfortunately we cannot get a CTA of the chest to rule out PE given her creatinine level but will obtain lower extremity venous duplex.  VQ Scan showed no PE. D-Dimer trended down and blood sugars were fairly well controlled. She was stable to D/C Home at this time and follow up with PCP within 1-2 weeks.   Discharge Diagnoses:  Active Problems:   AKI (acute kidney injury) ([place])   Type 1 diabetes mellitus with ketoacidosis without coma (HCC)   Respiratory failure (HCC)   Seizure ([place])   Delirium   Hypertensive emergency  PRES (posterior reversible encephalopathy syndrome)   Malnutrition of moderate degree   Agitation  Acute Metabolic Encephalopathy, back to baseline  Seizure Like Activity  -Improved and she appears at baseline -Likely was multifactorial -She is hypotensive in the ED with MRI suggestive of press -She also has a history of substance  abuse -Currently her GCS score is 15 and she did have seizure-like activity and platelets in the ED -Neurology is following and appreciating their assistance and they are recommending Keppra 500 mg every 12h for now  -Continue seizure precautions and Seizure Recommendations given -Per neurology the goal is for a systolic blood pressure less than 140 and avoiding hypotension -Last blood pressure reading was 130/68 at the time of D/C    Hypertension -Has been poorly controlled -Has been on 3 different medications at home -Current goal is for systolic blood pressure 680 140 with long-term goal being 120/80 -Continue home amlodipine and carvedilol; if necessary will go up to Carvedilol 25 mg p.o. twice daily -Hydralazine was restarted 50 mg every 8 and was increased today to 100 mg p.o. every 8h -Continue monitor blood pressure for protocol -Last blood pressure reading was 130/68 at the time of D/C    Diabetes mellitus type 1 -Inpatient blood glucose goal is 140-180  -Attending sliding scale and stop D10 given that she is now on a diet -Started on Levemir yesterday and changed to sliding scale insulin AC at bedtime and added 2 units of NovoLog 3 times daily with meals -Diabetes education coronary consulted and follow -CBGs ranging from 144-298  AKI on CKD Stage 3b Metabolic acidosis Hyperphosphatemia -She presented with a creatinine of 1.45 and trended elevated from 2.14 but is now improving and BUN/creatinine went from 25/1.91 and trended down to 24/1.66 the day before yesterday and trended down to 28/1.64 but it is trending 35/1.77 -> 42/1.93 -Patient's metabolic acidosis is improved and she now has a CO2 of 20, anion gap of 5, chloride level 106 -Phosphorus level is now 4.9 and improved -She is initially hypertensive and blood pressure dropped may have caused worsening renal function -Renal ultrasound was done and was negative for hydronephrosis or obstructive nephropathy -Continue MAP  of greater than 65 -Avoid nephrotoxic medications, contrast dyes, hypotension renally dose medication -Strict I's and O's and urine output and continue to monitor and trend renal function carefully and repeat CMP within 12 week    Anemia of Chronic Kidney Disease -Stable -Patient hemoglobin/hematocrit is 7.9/24.8 -> 7.6/23.8 -> 7.9/24.9 -> 7.0/22.1 -Check Anemia Panel in the outpatient setting  -Continue to monitor for signs and symptoms of bleeding now that Heparin gtt is starting; currently no overt bleeding noted -Repeat CBC  within 1 week   Hypomagnesemia -In the setting of diarrhea -Patient's magnesium level is now improved to 2.2 -Replete with IV mag sulfate 2 g yesterday  -Continue monitor and replete as necessary and repeat magnesium level in a.m.   Hyponatremia -Mild.  Sodium went from 136 and trended down to 133 and is now 135 yesterday and is 131 today  -Continue monitor and trend and repeat CMP within 1 week.   Diarrhea -Has had diarrhea for baseline -States that she wears diapers and pads at home for this -Flexi-Seal was placed but now removed -We will need outpatient follow-up with PCP and supportive care -Will Trial Imodium and do not feel as if it is related to C Diff as she is afebrile and has no Leukocytosis   Nausea and Vomiting -In the setting  of gastroparesis however has a notable normal gastric emptying study in 2021 -Continue monitor QTC and continue with Reglan and as needed Zofran antiemetics -Nausea and Vomiting have improved   Recent Left Great Toe Osteomyelitis -Completed Abx 12/01/20 -Remove PICC Line and follow up with ID as an outpatient setting within 1-2 weeks   Malnutrition of Moderate Degree -Nutritionist consulted for further evaluation recommendations recommending boost breeze p.o. twice daily as well as Prosource plus 30 mL p.o. twice daily   COVID-Positive -Airborne and contact precautions -Incidental finding and she has no respiratory  symptoms at all currently -Checked Inflammatory Markers Recent Labs    12/08/20 0227 12/09/20 0905 12/10/20 0435  DDIMER 2.44* >20.00* 1.74*  FERRITIN 470* 460* 12  LDH 217* 213* 198*  CRP 0.6 <0.5 0.6   Lab Results  Component Value Date   SARSCOV2NAA POSITIVE (A) 12/04/2020   [place] NEGATIVE 11/12/2020   [place] NEGATIVE 11/03/2020   [place] NEGATIVE 09/25/2020    -Discussed with the patient about starting remdesivir versus Paxlovid and she has elected for Paxlovid -Chest x-ray done and showed "Mild peribronchial airspace consolidation, left greater than right lung base may represent atypical pneumonia."; She is asymptomataic  -Will order Flutter Valve, Incentive Spirometry, and Guaifenesin 1200 mg po BID -We will discontinue DuoNeb every 6 as needed and add Combivent 1 puff every 6 as needed for wheezing or shortness of breath -We will add zinc and vitamin C -Check LE venous duplex to r/o VTE and unfortunately cannot get a CTA PE protocol given Renal Fxn but will order a VQ scan to evaluate and showed no PE -Since D-Dimer is trending down will stop Heparin gtt and she is stable to D/C home and complet Paxlovid treatment   Discharge Instructions  Discharge Instructions     Call MD for:  difficulty breathing, headache or visual disturbances   Complete by: As directed    Call MD for:  extreme fatigue   Complete by: As directed    Call MD for:  hives   Complete by: As directed    Call MD for:  persistant dizziness or light-headedness   Complete by: As directed    Call MD for:  persistant nausea and vomiting   Complete by: As directed    Call MD for:  redness, tenderness, or signs of infection (pain, swelling, redness, odor or green/yellow discharge around incision site)   Complete by: As directed    Call MD for:  severe uncontrolled pain   Complete by: As directed    Call MD for:  temperature >100.4   Complete by: As directed    Diet - low sodium heart  healthy   Complete by: As directed    Diet Carb Modified   Complete by: As directed    Discharge instructions   Complete by: As directed    You were cared for by a hospitalist during your hospital stay. If you have any questions about your discharge medications or the care you received while you were in the hospital after you are discharged, you can call the unit and ask to speak with the hospitalist on call if the hospitalist that took care of you is not available. Once you are discharged, your primary care physician will handle any further medical issues. Please note that NO REFILLS for any discharge medications will be authorized once you are discharged, as it is imperative that you return to your primary care physician (or establish a relationship with a primary care physician  if you do not have one) for your aftercare needs so that they can reassess your need for medications and monitor your lab values.  Follow up with PCP and Neurology within 1-2 weeks. Take all medications as prescribed. If symptoms change or worsen please return to the ED for evaluation   Driving Restrictions   Complete by: As directed    [place] Per [facility] statutes, patients with seizures are not allowed to drive until they have been seizure-free for six months.   Use caution when using heavy equipment or power tools. Avoid working on ladders or at heights. Take showers instead of baths. Ensure the water temperature is not too high on the home water heater. Do not go swimming alone. Do not lock yourself in a room alone (i.e. bathroom). When caring for infants or small children, sit down when holding, feeding, or changing them to minimize risk of injury to the child in the event you have a seizure. Maintain good sleep hygiene. Avoid alcohol.    If patient has another seizure, call 911 and bring them back to the ED if: A.  The seizure lasts longer than 5 minutes.      B.  The patient doesn't wake  shortly after the seizure or has new problems such as difficulty seeing, speaking or moving following the seizure C.  The patient was injured during the seizure D.  The patient has a temperature over 102 F (39C) E.  The patient vomited during the seizure and now is having trouble breathing   Increase activity slowly   Complete by: As directed    No wound care   Complete by: As directed       Allergies as of 12/10/2020       Reactions   Other Anaphylaxis   Reaction to [place] nuts   Lactose Intolerance (gi) Diarrhea        Medication List     STOP taking these medications    azithromycin 250 MG tablet Commonly known as: ZITHROMAX   furosemide 40 MG tablet Commonly known as: LASIX   mirtazapine 30 MG tablet Commonly known as: REMERON   pantoprazole 40 MG tablet Commonly known as: PROTONIX       TAKE these medications    (feeding supplement) PROSource Plus liquid Take 30 mLs by mouth 2 (two) times daily between meals.   Albuterol Sulfate 108 (90 Base) MCG/ACT Aepb Commonly known as: PROAIR RESPICLICK Inhale 2 puffs into the lungs daily as needed. What changed: reasons to take this   amLODipine 10 MG tablet Commonly known as: NORVASC Take 1 tablet (10 mg total) by mouth daily.   ascorbic acid 500 MG tablet Commonly known as: VITAMIN C Take 1 tablet (500 mg total) by mouth daily.   busPIRone 5 MG tablet Commonly known as: BUSPAR Take 1 tablet (5 mg total) by mouth 3 (three) times daily.   carvedilol 12.5 MG tablet Commonly known as: COREG Take 1 tablet (12.5 mg total) by mouth 2 (two) times daily with a meal.   cyclobenzaprine 10 MG tablet Commonly known as: FLEXERIL Take 1 tablet (10 mg total) by mouth 3 (three) times daily as needed for muscle spasms.   Dexcom G6 Sensor Misc USE AS DIRECTED EVERY 10 DAYS   Dexcom G6 Transmitter Misc USE AS DIRECTED TO  MONITOR  GLUCOSE  LEVELS   dicyclomine 20 MG tablet Commonly known as: BENTYL Take 1 tablet  (20 mg total) by mouth every 8 (eight)  hours as needed for spasms.   EPINEPHrine 0.3 mg/0.3 mL Soaj injection Commonly known as: EPI-PEN Inject 0.3 mg into the muscle once as needed (anaphylaxis/allergic reaction). What changed: reasons to take this   gabapentin 100 MG capsule Commonly known as: NEURONTIN Take 200 mg by mouth 3 (three) times daily.   GenTeal 0.25-0.3 % Gel Generic drug: Carboxymethylcell-Hypromellose Place 1 drop into both eyes daily as needed (dryeyes).   guaiFENesin 600 MG 12 hr tablet Commonly known as: MUCINEX Take 1 tablet (600 mg total) by mouth 2 (two) times daily for 5 days.   hydrALAZINE 50 MG tablet Commonly known as: APRESOLINE Take 100 mg by mouth 2 (two) times daily.   insulin glargine 100 UNIT/ML injection Commonly known as: LANTUS Inject 0.05 mLs (5 Units total) into the skin daily. What changed: how much to take   insulin lispro 100 UNIT/ML injection Commonly known as: HUMALOG Inject 0-5 Units into the skin 3 (three) times daily before meals. Sliding scale   Ipratropium-Albuterol 20-100 MCG/ACT Aers respimat Commonly known as: COMBIVENT Inhale 1 puff into the lungs every 6 (six) hours as needed for wheezing or shortness of breath.   levETIRAcetam 500 MG tablet Commonly known as: KEPPRA Take 1 tablet (500 mg total) by mouth 2 (two) times daily.   loperamide 2 MG capsule Commonly known as: IMODIUM Take 6 mg by mouth daily as needed for diarrhea or loose stools.   LORazepam 0.5 MG tablet Commonly known as: ATIVAN Take 1 tablet (0.5 mg total) by mouth 2 (two) times daily.   metoCLOPramide 5 MG tablet Commonly known as: REGLAN Take 1 tablet (5 mg total) by mouth 3 (three) times daily before meals. Please make and keep an appointment for further refills.   nirmatrelvir/ritonavir EUA (renal dosing) 10 x 150 MG & 10 x 100MG Tabs Commonly known as: PAXLOVID Take 2 tablets by mouth 2 (two) times daily for 5 days. Patient GFR is 35. Take  nirmatrelvir (150 mg) one tablet twice daily for 5 days and ritonavir (100 mg) one tablet twice daily for 5 days.   ondansetron 4 MG disintegrating tablet Commonly known as: Zofran ODT Take 1 tablet (4 mg total) by mouth every 8 (eight) hours as needed for nausea or vomiting.   oxyCODONE-acetaminophen 10-325 MG tablet Commonly known as: PERCOCET Take 1 tablet by mouth every 4 (four) hours as needed for pain.   PenTips 32G X 4 MM Misc Generic drug: Insulin Pen Needle USE AS DIRECTED WITH INSULIN PENS   traZODone 50 MG tablet Commonly known as: DESYREL Take 0.5-1 tablets (25-50 mg total) by mouth at bedtime as needed for sleep.   zinc sulfate 220 (50 Zn) MG capsule Take 1 capsule (220 mg total) by mouth daily.        Follow-up Information     [name], MD. Go on 12/18/2020.   Specialty: Family Medicine Why: Please attend your hospital discharge appointment with Dr Jerline Pain on Tuesday, 12/18/20 at 8:20am. Contact information: [place] [address] [place], [facility] Follow up.   Specialty: Home Health Services Why: Alvis Lemmings will call you to schedule your first home visit. Contact information: [address] [place] [address] [phone]                Allergies  Allergen Reactions   Other Anaphylaxis    Reaction to [place] nuts    Lactose Intolerance (Gi) Diarrhea  Consultations: Neurology PCCM Transfer  Procedures/Studies: CT ABDOMEN PELVIS WO CONTRAST  Result Date: 12/03/2020 CLINICAL DATA:  Seizure, pulled from car, postictal, confusion. Tachycardia. Aortic disease, nontraumatic; Abdominal pain, acute, nonlocalized EXAM: CT CHEST, ABDOMEN AND PELVIS WITHOUT CONTRAST TECHNIQUE: Multidetector CT imaging of the chest, abdomen and pelvis was performed following the standard protocol without IV contrast. COMPARISON:  11/12/2020 unenhanced CT abdomen/pelvis. 06/04/2020 chest CT angiogram. FINDINGS:  CT CHEST FINDINGS Cardiovascular: Top-normal heart size. No significant pericardial effusion/thickening. Right PICC terminates at the cavoatrial junction. Top-normal caliber main pulmonary artery (3.4 cm diameter). Mediastinum/Nodes: No discrete thyroid nodules. Unremarkable esophagus. Enteric tube terminates in the proximal stomach. No pathologically enlarged axillary, mediastinal or hilar lymph nodes, noting limited sensitivity for the detection of hilar adenopathy on this noncontrast study. Lungs/Pleura: No pneumothorax. No pleural effusion. Endotracheal tube tip is 2.2 cm above the carina. Mild to moderate patchy consolidation and ground-glass opacity throughout the dependent lower lobes bilaterally. Mild patchy ground-glass opacity in the left upper lobe. No lung masses or significant pulmonary nodules. Musculoskeletal: No aggressive appearing focal osseous lesions. Nondisplaced healing subacute anterior left fourth rib fracture. No acute fractures. Stable sclerotic T6 bone island. Moderate anasarca. CT ABDOMEN PELVIS FINDINGS Hepatobiliary: Normal liver with no liver mass. Cholecystectomy. No biliary ductal dilatation. Pancreas: Normal, with no mass or duct dilation. Spleen: Normal size. No mass. Adrenals/Urinary Tract: Normal adrenals. No renal stones. No hydronephrosis. No contour deforming renal masses. Normal bladder. Stomach/Bowel: Normal non-distended stomach. Normal caliber small bowel with no small bowel wall thickening. Appendix not discretely visualized. Collapsed large bowel with no diverticulosis, definite large bowel wall thickening or pericolonic fat stranding. Vascular/Lymphatic: Normal caliber abdominal aorta. No pathologically enlarged lymph nodes in the abdomen or pelvis. Reproductive: Grossly normal uterus.  No adnexal mass. Other: No pneumoperitoneum. Small volume ascites. No focal fluid collections. Moderate anasarca. Musculoskeletal: No aggressive appearing focal osseous lesions.  IMPRESSION: 1. Moderate anasarca. Small volume ascites. 2. Mild to moderate patchy consolidation and ground-glass opacity throughout the dependent lower lobes bilaterally. Mild patchy ground-glass opacity in the left upper lobe. Suspect a combination of aspiration and atelectasis. 3. Well positioned endotracheal and enteric tubes. 4. Nondisplaced healing subacute anterior left fourth rib fracture. No acute traumatic injury in the chest, abdomen and pelvis on this noncontrast scan. Electronically Signed   By: Ilona Sorrel M.D.   On: 12/03/2020 06:28   DG Chest 2 View  Result Date: 11/19/2020 CLINICAL DATA:  Shortness of breath.  Facial swelling. EXAM: CHEST - 2 VIEW COMPARISON:  Chest x-ray dated November 12, 2020. FINDINGS: Unchanged right upper extremity PICC line. Normal heart size. New trace bilateral pleural effusions. New left perihilar and bibasilar opacities. No pneumothorax. No acute osseous abnormality. IMPRESSION: 1. New left perihilar opacity concerning for pneumonia. 2. New trace bilateral pleural effusions with bibasilar atelectasis versus infiltrate. Electronically Signed   By: Titus Dubin M.D.   On: 11/19/2020 16:14   DG Chest 2 View  Result Date: 11/12/2020 CLINICAL DATA:  Abdominal/back pain EXAM: CHEST - 2 VIEW COMPARISON:  11/03/2020 FINDINGS: Prior right lung pneumonia has resolved. Left lung is clear. No pleural effusion or pneumothorax. The heart is normal in size. Right arm PICC terminates at the cavoatrial junction. IMPRESSION: Prior right lung pneumonia has resolved. No evidence of acute cardiopulmonary disease. Electronically Signed   By: Julian Hy M.D.   On: 11/12/2020 03:03   CT HEAD WO CONTRAST (5MM)  Result Date: 12/03/2020 CLINICAL DATA:  Seizure EXAM: CT HEAD WITHOUT CONTRAST TECHNIQUE:  Contiguous axial images were obtained from the base of the skull through the vertex without intravenous contrast. COMPARISON:  09/25/2020 FINDINGS: Brain: No evidence of acute  infarction, hemorrhage, hydrocephalus, extra-axial collection or mass lesion/mass effect. Vascular: No hyperdense vessel or unexpected calcification. Skull: Normal. Negative for fracture or focal lesion. Sinuses/Orbits: No acute finding. IMPRESSION: Stable and negative head CT. Electronically Signed   By: Monte Fantasia M.D.   On: 12/03/2020 06:17   CT CHEST WO CONTRAST  Result Date: 12/03/2020 CLINICAL DATA:  Seizure, pulled from car, postictal, confusion. Tachycardia. Aortic disease, nontraumatic; Abdominal pain, acute, nonlocalized EXAM: CT CHEST, ABDOMEN AND PELVIS WITHOUT CONTRAST TECHNIQUE: Multidetector CT imaging of the chest, abdomen and pelvis was performed following the standard protocol without IV contrast. COMPARISON:  11/12/2020 unenhanced CT abdomen/pelvis. 06/04/2020 chest CT angiogram. FINDINGS: CT CHEST FINDINGS Cardiovascular: Top-normal heart size. No significant pericardial effusion/thickening. Right PICC terminates at the cavoatrial junction. Top-normal caliber main pulmonary artery (3.4 cm diameter). Mediastinum/Nodes: No discrete thyroid nodules. Unremarkable esophagus. Enteric tube terminates in the proximal stomach. No pathologically enlarged axillary, mediastinal or hilar lymph nodes, noting limited sensitivity for the detection of hilar adenopathy on this noncontrast study. Lungs/Pleura: No pneumothorax. No pleural effusion. Endotracheal tube tip is 2.2 cm above the carina. Mild to moderate patchy consolidation and ground-glass opacity throughout the dependent lower lobes bilaterally. Mild patchy ground-glass opacity in the left upper lobe. No lung masses or significant pulmonary nodules. Musculoskeletal: No aggressive appearing focal osseous lesions. Nondisplaced healing subacute anterior left fourth rib fracture. No acute fractures. Stable sclerotic T6 bone island. Moderate anasarca. CT ABDOMEN PELVIS FINDINGS Hepatobiliary: Normal liver with no liver mass. Cholecystectomy. No  biliary ductal dilatation. Pancreas: Normal, with no mass or duct dilation. Spleen: Normal size. No mass. Adrenals/Urinary Tract: Normal adrenals. No renal stones. No hydronephrosis. No contour deforming renal masses. Normal bladder. Stomach/Bowel: Normal non-distended stomach. Normal caliber small bowel with no small bowel wall thickening. Appendix not discretely visualized. Collapsed large bowel with no diverticulosis, definite large bowel wall thickening or pericolonic fat stranding. Vascular/Lymphatic: Normal caliber abdominal aorta. No pathologically enlarged lymph nodes in the abdomen or pelvis. Reproductive: Grossly normal uterus.  No adnexal mass. Other: No pneumoperitoneum. Small volume ascites. No focal fluid collections. Moderate anasarca. Musculoskeletal: No aggressive appearing focal osseous lesions. IMPRESSION: 1. Moderate anasarca. Small volume ascites. 2. Mild to moderate patchy consolidation and ground-glass opacity throughout the dependent lower lobes bilaterally. Mild patchy ground-glass opacity in the left upper lobe. Suspect a combination of aspiration and atelectasis. 3. Well positioned endotracheal and enteric tubes. 4. Nondisplaced healing subacute anterior left fourth rib fracture. No acute traumatic injury in the chest, abdomen and pelvis on this noncontrast scan. Electronically Signed   By: Ilona Sorrel M.D.   On: 12/03/2020 06:28   MR BRAIN WO CONTRAST  Result Date: 12/03/2020 CLINICAL DATA:  Seizure, abnormal neuro exam. EXAM: MRI HEAD WITHOUT CONTRAST TECHNIQUE: Multiplanar, multiecho pulse sequences of the brain and surrounding structures were obtained without intravenous contrast. COMPARISON:  Prior head CT examinations 12/03/2020 and earlier. FINDINGS: Brain: Cerebral volume is normal for age. There is fairly symmetric patchy cortical/subcortical T2/FLAIR hyperintense signal abnormality within the bilateral frontal lobes, parietooccipital lobes and posterior temporal lobes.  Additionally, there are patchy foci of T2/FLAIR hyperintense signal abnormality within the posterior limbs of internal capsules and bilateral basal ganglia bilaterally. 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 Alternative etiologies (including cerebritis) can not be excluded. The hippocampi are symmetric in size  and signal. There is no acute infarct. No evidence of an intracranial mass. No chronic intracranial blood products. No extra-axial fluid collection. No midline shift. Vascular: Maintained flow voids within the proximal large arterial vessels. Skull and upper cervical spine: No focal suspicious marrow lesion. Sinuses/Orbits: Visualized orbits show no acute finding. Trace bilateral ethmoid sinus mucosal thickening. Other: Posterior scalp edema bilaterally. These results will be called to the ordering clinician or representative by the Radiologist Assistant, and communication documented in the PACS or [employer]. IMPRESSION: Fairly symmetric patchy cortical/subcortical T2/FLAIR hyperintense signal abnormality within the bilateral frontal lobes, parietooccipital lobes and posterior temporal lobes. Additional patchy foci of T2/FLAIR hyperintense signal abnormality within the posterior limbs of the internal capsules and bilateral basal ganglia bilaterally. 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 Alternative etiologies (including cerebritis) can not be excluded. Clinical correlation is recommended. Additionally, a follow-up brain MRI is recommended to assess for stability, resolution or progression of these signal changes. Electronically Signed   By: Kellie Simmering D.O.   On: 12/03/2020 16:31   NM Pulmonary Perfusion  Result Date: 12/10/2020 CLINICAL DATA:  PE suspected, low/intermediate prob, positive D-dimer, COVID+ with D-Dimer >20.00 EXAM: NUCLEAR MEDICINE PERFUSION LUNG SCAN  TECHNIQUE: Perfusion images were obtained in multiple projections after intravenous injection of radiopharmaceutical. Ventilation scans intentionally deferred if perfusion scan and chest x-ray adequate for interpretation during COVID 19 epidemic. RADIOPHARMACEUTICALS:  4.0 mCi Tc-38mMAA IV COMPARISON:  None. FINDINGS: There is a normal distribution of radiotracer throughout the lungs bilaterally. No segmental perfusion defects are identified to suggest acute pulmonary embolism. IMPRESSION: Normal examination.  No pulmonary embolism. Electronically Signed   By: AFidela SalisburyM.D.   On: 12/10/2020 16:37   [place]NAL  Result Date: 12/05/2020 CLINICAL DATA:  Acute kidney injury. EXAM: RENAL / URINARY TRACT ULTRASOUND COMPLETE COMPARISON:  Noncontrast CT 12/03/2020.  Renal ultrasound 02/16/2020 FINDINGS: Right Kidney: Renal measurements: 12.2 x 6.0 x 5.5 cm = volume: 207 mL. Diffusely increased renal parenchymal echogenicity. No hydronephrosis. No evidence of focal lesion or stone. Left Kidney: Renal measurements: 12.7 x 5.2 x 4.6 cm = volume: 158 mL. Diffusely increased renal parenchymal echogenicity. No hydronephrosis. No evidence of focal lesion or stone. Bladder: Decompressed by Foley catheter. Bladder wall may be thickened despite nondistention. Other: Small volume abdominopelvic ascites, as seen on recent CT. IMPRESSION: 1. No hydronephrosis or obstructive uropathy. 2. Mild increased renal parenchymal echogenicity consistent with chronic medical renal disease. 3. Question bladder wall thickening versus nondistention. 4. Small volume ascites as seen on recent CT. Electronically Signed   By: MKeith RakeM.D.   On: 12/05/2020 23:27   DG CHEST PORT 1 VIEW  Result Date: 12/10/2020 CLINICAL DATA:  Shortness of breath, COVID. EXAM: PORTABLE CHEST 1 VIEW COMPARISON:  12/09/2020 and CT chest 06/04/2020. FINDINGS: Trachea is midline. Heart size is accentuated by AP technique. Right PICC tip is in the low SVC.  Minimal streaky opacification in the left lung base. Lungs are otherwise clear. No pleural fluid. IMPRESSION: Minimal streaky atelectasis in the left lung base. Electronically Signed   By: MLorin PicketM.D.   On: 12/10/2020 09:21   DG CHEST PORT 1 VIEW  Result Date: 12/09/2020 CLINICAL DATA:  Shortness of breath. EXAM: PORTABLE CHEST 1 VIEW COMPARISON:  December 08, 2020 FINDINGS: Right PICC line in stable position. Borderline enlarged cardiac silhouette. Mediastinal contours appear intact. There is no evidence of pleural effusion or pneumothorax. Mild peribronchial airspace consolidation, left  greater than right lung base. Osseous structures are without acute abnormality. Soft tissues are grossly normal. IMPRESSION: Mild peribronchial airspace consolidation, left greater than right lung base may represent atypical pneumonia. Electronically Signed   By: Fidela Salisbury M.D.   On: 12/09/2020 10:47   DG CHEST PORT 1 VIEW  Result Date: 12/08/2020 CLINICAL DATA:  Back pain. Altered mental status. COVID-19 positive. EXAM: PORTABLE CHEST 1 VIEW COMPARISON:  12/03/2020 FINDINGS: Right-sided PICC line tip at superior caval/atrial junction. Interval extubation. Midline trachea. Borderline cardiomegaly. No pleural effusion or pneumothorax. Left greater than right interstitial coarsening without well-defined lobar consolidation. IMPRESSION: Bilateral interstitial coarsening is favored to be chronic. Especially in the left upper lobe, mild COVID-19 pneumonia could look similar. Electronically Signed   By: Abigail Miyamoto M.D.   On: 12/08/2020 10:32   DG Chest Port 1 View  Result Date: 12/03/2020 CLINICAL DATA:  Intubated, seizure EXAM: PORTABLE CHEST 1 VIEW COMPARISON:  11/19/2020 chest radiograph. FINDINGS: Endotracheal tube tip is 4.3 cm above the carina. Enteric tube enters stomach with the tip not seen on this image. Right PICC terminates at the cavoatrial junction. Stable cardiomediastinal silhouette with  top-normal heart size. No pneumothorax. No pleural effusion. Mild hazy patchy bibasilar lung opacities. No pulmonary edema. IMPRESSION: 1. Well-positioned support structures. 2. Mild hazy patchy bibasilar lung opacities, suspect atelectasis or aspiration. Electronically Signed   By: Ilona Sorrel M.D.   On: 12/03/2020 06:30   EEG adult  Result Date: 12/03/2020 Lora Havens, MD     12/03/2020  5:56 PM Patient Name: [name] MRN: [id number] Epilepsy Attending: Lora Havens Referring Physician/Provider: Dr Kerney Elbe Date: 12/03/2020 Duration: 22.05 mins Patient history: 30yo F with ams. EEG to evaluate for seizure. Level of alertness: comatose AEDs during EEG study: GBP, LEV, propofol, Ativan Technical aspects: This EEG study was done with scalp electrodes positioned according to the 10-20 International system of electrode placement. Electrical activity was acquired at a sampling rate of 500Hz  and reviewed with a high frequency filter of 70Hz  and a low frequency filter of 1Hz . EEG data were recorded continuously and digitally stored. Description:  EEG showed continuous generalized 2-3Hz  delta slowing admixed with an excessive amount of 15 to 18 Hz beta activity with irregular morphology distributed symmetrically and diffusely. Hyperventilation and photic stimulation were not performed.    ABNORMALITY - Continuous slow, generalized - Excessive beta, generalized  IMPRESSION: This study is suggestive of severe diffuse encephalopathy, nonspecific etiology but likely related to sedation. No seizures or epileptiform discharges were seen throughout the recording.  Dr Cheral Marker was notified.  Priyanka Barbra Sarks   Overnight EEG with video  Result Date: 12/04/2020 Lora Havens, MD     12/05/2020  9:02 AM Patient Name: [name] MRN: [id number] Epilepsy Attending: Lora Havens Referring Physician/Provider: Dr Kerney Elbe Duration: 12/03/2020 1751 to 12/04/2020 1751  Patient history: 30yo F with ams. EEG to  evaluate for seizure.  Level of alertness: comatose  AEDs during EEG study: GBP, LEV, propofol  Technical aspects: This EEG study was done with scalp electrodes positioned according to the 10-20 International system of electrode placement. Electrical activity was acquired at a sampling rate of 500Hz  and reviewed with a high frequency filter of 70Hz  and a low frequency filter of 1Hz . EEG data were recorded continuously and digitally stored.  Description:  EEG showed continuous generalized 2-3Hz  delta slowing admixed with an excessive amount of 15 to 18 Hz beta activity with irregular morphology distributed  symmetrically and diffusely. Hyperventilation and photic stimulation were not performed.    ABNORMALITY - Continuous slow, generalized - Excessive beta, generalized  IMPRESSION: This study is suggestive of severe diffuse encephalopathy, nonspecific etiology but likely related to sedation. No seizures or epileptiform discharges were seen throughout the recording.  Lora Havens    CT RENAL STONE STUDY  Result Date: 11/12/2020 CLINICAL DATA:  Abdominal pain. EXAM: CT ABDOMEN AND PELVIS WITHOUT CONTRAST TECHNIQUE: Multidetector CT imaging of the abdomen and pelvis was performed following the standard protocol without IV contrast. COMPARISON:  CT abdomen pelvis dated September 25, 2020. FINDINGS: Lower chest: No acute abnormality. Hepatobiliary: No focal liver abnormality is seen. Status post cholecystectomy. No biliary dilatation. Pancreas: Unremarkable. No pancreatic ductal dilatation or surrounding inflammatory changes. Spleen: Normal in size without focal abnormality. Adrenals/Urinary Tract: Adrenal glands are unremarkable. Kidneys are normal, without renal calculi, focal lesion, or hydronephrosis. Unchanged mild circumferential bladder wall thickening. Stomach/Bowel: Stomach is within normal limits. Appendix appears normal. No evidence of bowel wall thickening, distention, or inflammatory changes.  Vascular/Lymphatic: No significant vascular findings are present. No enlarged abdominal or pelvic lymph nodes. Reproductive: Uterus and bilateral adnexa are unremarkable. Other: Unchanged small ascites.  No pneumoperitoneum. Musculoskeletal: No acute or significant osseous findings. New 1.7 cm intermediate density in the right lateral abdominal may be injection related. Unchanged diffuse anasarca. IMPRESSION: 1. No acute intra-abdominal process. 2. Unchanged small ascites and diffuse anasarca. Electronically Signed   By: Titus Dubin M.D.   On: 11/12/2020 05:45   VAS [place] LOWER EXTREMITY VENOUS (DVT)  Result Date: 12/09/2020  Lower Venous DVT Study Patient Name:  VESPER TRANT  Date of Exam:   12/09/2020 Medical Rec #: [id number]       Accession #:    [id number] Date of Birth: 1990/05/26       Patient Gender: F Patient Age:   30 years Exam Location:  [facility] Procedure:      VAS [place] LOWER EXTREMITY VENOUS (DVT) Referring Phys: Raiford Noble --------------------------------------------------------------------------------  Indications: Covid, D-Dimer >20, edema.  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 Comparison Study: Prior negative bilateral LEV done 05/17/2020 Performing Technologist: Sharion Dove RVS  Examination Guidelines: A complete evaluation includes B-mode imaging, spectral Doppler, color Doppler, and power Doppler as needed of all accessible portions of each vessel. Bilateral testing is considered an integral part of a complete examination. Limited examinations for reoccurring indications may be performed as noted. The reflux portion of the exam is performed with the patient in reverse Trendelenburg.  +---------+---------------+---------+-----------+----------+-------------------+ RIGHT    CompressibilityPhasicitySpontaneityPropertiesThrombus Aging      +---------+---------------+---------+-----------+----------+-------------------+ CFV      Full                                          pulsatile waveforms +---------+---------------+---------+-----------+----------+-------------------+ SFJ      Full                                                             +---------+---------------+---------+-----------+----------+-------------------+ FV Prox  Full                                                             +---------+---------------+---------+-----------+----------+-------------------+  FV Mid   Full                                                             +---------+---------------+---------+-----------+----------+-------------------+ FV DistalFull                                                             +---------+---------------+---------+-----------+----------+-------------------+ PFV      Full                                                             +---------+---------------+---------+-----------+----------+-------------------+ POP      Full           Yes      Yes                                      +---------+---------------+---------+-----------+----------+-------------------+ PTV      Full                                                             +---------+---------------+---------+-----------+----------+-------------------+ PERO     Full                                                             +---------+---------------+---------+-----------+----------+-------------------+   +---------+---------------+---------+-----------+----------+-------------------+ LEFT     CompressibilityPhasicitySpontaneityPropertiesThrombus Aging      +---------+---------------+---------+-----------+----------+-------------------+ CFV      Full                                         pulsatile waveforms +---------+---------------+---------+-----------+----------+-------------------+ SFJ      Full                                                              +---------+---------------+---------+-----------+----------+-------------------+ FV Prox  Full                                                             +---------+---------------+---------+-----------+----------+-------------------+ FV Mid   Full                                                             +---------+---------------+---------+-----------+----------+-------------------+  FV DistalFull                                                             +---------+---------------+---------+-----------+----------+-------------------+ PFV      Full                                                             +---------+---------------+---------+-----------+----------+-------------------+ POP      Full           Yes      Yes                                      +---------+---------------+---------+-----------+----------+-------------------+ PTV      Full                                                             +---------+---------------+---------+-----------+----------+-------------------+ PERO     Full                                                             +---------+---------------+---------+-----------+----------+-------------------+    Summary: BILATERAL: - No evidence of deep vein thrombosis seen in the lower extremities, bilaterally. - RIGHT: - A cystic structure is found in the popliteal fossa. interstitial edema noted throughout  LEFT: - A cystic structure is found in the popliteal fossa. Interstitial edema noted throughout.  *See table(s) above for measurements and observations.    Preliminary     EEG IMPRESSION: This study was initially suggestive of severe diffuse encephalopathy, nonspecific etiology but likely related to sedation.  As sedation was weaned, EEG improved and was suggestive of moderate diffuse encephalopathy.  No seizures or epileptiform discharges were seen throughout the recording.  Subjective: Seen and examined at beside  and she is stable and has no CP or SOB. Denied any complaints and D-Dimer is trending down. Home Health PT recommended at D/C. Stable to D/C and follow up with Neurology, PCP, and ID as an outpatient .   Discharge Exam: Vitals:   12/10/20 1600 12/10/20 1752  BP: (!) 99/43 130/68  Pulse: 73 85  Resp: 10 18  Temp:  (!) 97.5 F (36.4 C)  SpO2: 98% 100%   Vitals:   12/10/20 0800 12/10/20 1200 12/10/20 1600 12/10/20 1752  BP: 130/71 127/66 (!) 99/43 130/68  Pulse: 87 87 73 85  Resp: 16 16 10 18   Temp: 98.1 F (36.7 C) 98.2 F (36.8 C)  (!) 97.5 F (36.4 C)  TempSrc: Oral Oral  Oral  SpO2: 100% 100% 98% 100%  Weight:      Height:       General: Pt is alert, awake, not  in acute distress Cardiovascular: RRR, S1/S2 +, no rubs, no gallops Respiratory: Diminished bilaterally, no wheezing, no rhonchi; Unlabored breathing  Abdominal: Soft, NT, ND, bowel sounds + Extremities: 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    Microbiology: Recent Results (from the past 240 hour(s))  MRSA Next Gen by PCR, Nasal     Status: None   Collection Time: 12/03/20  1:56 PM   Specimen: Nasal Mucosa; Nasal Swab  Result Value Ref Range Status   MRSA by PCR Next Gen NOT DETECTED NOT DETECTED Final    Comment: (NOTE) The GeneXpert MRSA Assay (FDA approved for NASAL specimens only), is one component of a comprehensive MRSA colonization surveillance program. It is not intended to diagnose MRSA infection nor to guide or monitor treatment for MRSA infections. Test performance is not FDA approved in patients less than 56 years old. Performed at [facility], [place] [address] [place], [place] [address]   SARS CORONAVIRUS 2 (TAT 6-24 HRS) Nasopharyngeal Nasopharyngeal Swab     Status: Abnormal   Collection  Time: 12/04/20  3:03 PM   Specimen: Nasopharyngeal Swab  Result Value Ref Range Status   SARS Coronavirus 2 POSITIVE (A) NEGATIVE Final    Comment: (NOTE) SARS-CoV-2 target nucleic acids are DETECTED.  The SARS-CoV-2 RNA is generally detectable in upper and lower respiratory specimens during the acute phase of infection. Positive results are indicative of the presence of SARS-CoV-2 RNA. Clinical correlation with patient history and other diagnostic information is  necessary to determine patient infection status. Positive results do not rule out bacterial infection or co-infection with other viruses.  The expected result is Negative.  Fact Sheet for Patients: SugarRoll.be  Fact Sheet for Healthcare Providers: https://www.woods-mathews.com/  This test is not yet approved or cleared by the [place] FDA and  has been authorized for detection and/or diagnosis of SARS-CoV-2 by FDA under an Emergency Use Authorization (EUA). This EUA will remain  in effect (meaning this test can be used) for the duration of the COVID-19 declaration under Section 564(b)(1) of the Act, 21 U. S.C. section 360bbb-3(b)(1), unless the authorization is terminated or revoked sooner.   Performed at [facility], [place] [address], [place], [place] [address]     Labs: BNP (last 3 results) Recent Labs    05/12/20 0041 05/16/20 1515 06/04/20 0420  BNP 399.9* 757.2* 194.1*   Basic Metabolic Panel: Recent Labs  Lab 12/05/20 0345 12/06/20 0630 12/07/20 0520 12/08/20 0227 12/09/20 0905 12/10/20 0435  NA 137 137 136 133* 135 131*  K 4.3 3.5 4.1 4.2 4.3 4.6  CL 109 109 106 107 108 106  CO2 24 23 21* 22 22 20*  GLUCOSE 72 75 163* 233* 307* 377*  BUN 22* 25* 24* 28* 35* 42*  CREATININE 2.14* 1.91* 1.66* 1.64* 1.77* 1.93*  CALCIUM 7.5* 7.7* 8.4* 7.9* 8.2* 7.9*  MG 2.1  --  1.8 1.6* 2.2 2.2  PHOS 3.2  --   --  4.9* 4.2 4.9*   Liver Function  Tests: Recent Labs  Lab 12/05/20 0345 12/08/20 0227 12/09/20 0905 12/10/20 0435  AST 13* 22 27 34  ALT 8 10 16 22   ALKPHOS 105 94 92 96  BILITOT 0.6 0.5 0.5 0.3  PROT 5.2* 5.5* 5.9* 5.5*  ALBUMIN 1.5* 1.7* 1.8* 1.7*   No results for  input(s): LIPASE, AMYLASE in the last 168 hours. No results for input(s): AMMONIA in the last 168 hours. CBC: Recent Labs  Lab 12/06/20 0630 12/07/20 1003 12/08/20 0227 12/09/20 0905 12/10/20 0435  WBC 7.4 6.5 8.0 4.9 5.5  NEUTROABS  --  4.3 5.7 2.8 3.0  HGB 8.0* 7.9* 7.6* 7.9* 7.0*  HCT 26.0* 24.8* 23.9* 24.9* 22.1*  MCV 94.2 91.9 91.9 94.0 93.6  PLT 165 171 177 215 189   Cardiac Enzymes: No results for input(s): CKTOTAL, CKMB, CKMBINDEX, TROPONINI in the last 168 hours. BNP: Invalid input(s): POCBNP CBG: Recent Labs  Lab 12/09/20 1625 12/09/20 2108 12/10/20 0805 12/10/20 1151 12/10/20 1642  GLUCAP 116* 168* 298* 144* 156*   D-Dimer Recent Labs    12/09/20 0905 12/10/20 0435  DDIMER >20.00* 1.74*   Hgb A1c No results for input(s): HGBA1C in the last 72 hours. Lipid Profile No results for input(s): CHOL, HDL, LDLCALC, TRIG, CHOLHDL, LDLDIRECT in the last 72 hours. Thyroid function studies No results for input(s): TSH, T4TOTAL, T3FREE, THYROIDAB in the last 72 hours.  Invalid input(s): FREET3 Anemia work up Recent Labs    12/09/20 0905 12/10/20 0435  FERRITIN 460* 12   Urinalysis    Component Value Date/Time   COLORURINE YELLOW (A) 11/29/2020 1244   APPEARANCEUR CLEAR (A) 11/29/2020 1244   LABSPEC 1.025 11/29/2020 1244   PHURINE 6.0 11/29/2020 1244   GLUCOSEU 500 (A) 11/29/2020 1244   HGBUR LARGE (A) 11/29/2020 1244   BILIRUBINUR NEGATIVE 11/29/2020 1244   BILIRUBINUR negative 01/02/2015 1717   KETONESUR TRACE (A) 11/29/2020 1244   PROTEINUR >300 (A) 11/29/2020 1244   UROBILINOGEN 0.2 01/13/2015 0223   NITRITE NEGATIVE 11/29/2020 1244   LEUKOCYTESUR NEGATIVE 11/29/2020 1244   Sepsis Labs Invalid input(s):  PROCALCITONIN,  WBC,  LACTICIDVEN Microbiology Recent Results (from the past 240 hour(s))  MRSA Next Gen by PCR, Nasal     Status: None   Collection Time: 12/03/20  1:56 PM   Specimen: Nasal Mucosa; Nasal Swab  Result Value Ref Range Status   MRSA by PCR Next Gen NOT DETECTED NOT DETECTED Final    Comment: (NOTE) The GeneXpert MRSA Assay (FDA approved for NASAL specimens only), is one component of a comprehensive MRSA colonization surveillance program. It is not intended to diagnose MRSA infection nor to guide or monitor treatment for MRSA infections. Test performance is not FDA approved in patients less than 61 years old. Performed at [facility], [place] [address], [place], [place] [address]   SARS CORONAVIRUS 2 (TAT 6-24 HRS) Nasopharyngeal Nasopharyngeal Swab     Status: Abnormal   Collection Time: 12/04/20  3:03 PM   Specimen: Nasopharyngeal Swab  Result Value Ref Range Status   SARS Coronavirus 2 POSITIVE (A) NEGATIVE Final    Comment: (NOTE) SARS-CoV-2 target nucleic acids are DETECTED.  The SARS-CoV-2 RNA is generally detectable in upper and lower respiratory specimens during the acute phase of infection. Positive results are indicative of the presence of SARS-CoV-2 RNA. Clinical correlation with patient history and other diagnostic information is  necessary to determine patient infection status. Positive results do not rule out bacterial infection or co-infection with other viruses.  The expected result is Negative.  Fact Sheet for Patients: SugarRoll.be  Fact Sheet for Healthcare Providers: https://www.woods-mathews.com/  This test is not yet approved or cleared by the [place] FDA and  has been authorized for detection and/or diagnosis of SARS-CoV-2 by FDA under an Emergency Use Authorization (EUA). This EUA will  remain  in effect (meaning this test can be used) for the duration of the COVID-19 declaration under  Section 564(b)(1) of the Act, 21 U. S.C. section 360bbb-3(b)(1), unless the authorization is terminated or revoked sooner.   Performed at [facility], [place] [address], [place], [place] [address]    Time coordinating discharge: 35 minutes  SIGNED:  Kerney Elbe, DO [facility] 12/10/2020, 7:46 PM Pager is on [place]  If 7PM-7AM, please contact night-coverage [email or web address]

## 2020-12-10 NOTE — Progress Notes (Addendum)
ANTICOAGULATION CONSULT NOTE - Follow Up Consult  Pharmacy Consult for Heparin Indication:  VTE  Allergies  Allergen Reactions   Other Anaphylaxis    Reaction to [place] nuts    Lactose Intolerance (Gi) Diarrhea    Patient Measurements: Height: 5' 7"  (170.2 cm) Weight: 75.2 kg (165 lb 12.6 oz) IBW/kg (Calculated) : 61.6 Heparin Dosing Weight: 75.2 kg  Vital Signs: Temp: 98.1 F (36.7 C) (09/12 0800) Temp Source: Oral (09/12 0800) BP: 130/71 (09/12 0800) Pulse Rate: 87 (09/12 0800)  Labs: Recent Labs    12/08/20 0227 12/09/20 0905 12/09/20 1850 12/10/20 0435  HGB 7.6* 7.9*  --  7.0*  HCT 23.9* 24.9*  --  22.1*  PLT 177 215  --  189  HEPARINUNFRC  --   --  0.53 0.36  CREATININE 1.64* 1.77*  --  1.93*    Estimated Creatinine Clearance: 44.7 mL/min (A) (by C-G formula based on SCr of 1.93 mg/dL (H)).  Assessment: 30 YO female with COVID who presented with encephalopathy and history of chronic nausea and vomiting. She had severe hypertension, hypokalemia, anion gap of 18, and PRES. Her D-dimer increased from 2.44 to >20 in about 24 hours. She was on SQ heparin for DVT ppx until ~0530 9/11. Prior pharmacist discussed with Dr. Alfredia Ferguson on 9/11, worried about possible VTE, but unable to get a CT due to poor renal function and difficulty with imaging. Pharmacy was consulted for IV heparin dosing.   Initial heparin level was therapeutic (0.53) last evening and remains therapeutic (0.36) today on 1300 units/hr. Hgb  trended down to 7.0, no bleeding reported. D-dimer down to 1.74.  VQ scan pending.  Duplex negative for DVT 9/11.  Goal of Therapy:  Heparin level 0.3-0.7 units/ml Monitor platelets by anticoagulation protocol: Yes   Plan:  Continue heparin drip at 1300 units/hr. Daily heparin level and CBC. Monitor for signs/symptoms of bleeding. Follow up VQ scan.  Arty Baumgartner, [place] 12/10/2020,11:56 AM  Addendum:   RN reports that patient reported that she  disconnected IV heparin ~4-5am.  Heparin level was drawn at 0436, so timing is unclear.   Will rebolus with 2500 units IV x 1 then resume drip at 1300 units/hr.   Heparin level ~6 hrs later.  Consuello Masse, RPh 12/10/2020 1:26 PM

## 2020-12-11 ENCOUNTER — Ambulatory Visit: Payer: Medicaid Other | Admitting: Endocrinology

## 2020-12-11 ENCOUNTER — Telehealth: Payer: Self-pay

## 2020-12-11 DIAGNOSIS — R0902 Hypoxemia: Secondary | ICD-10-CM | POA: Diagnosis not present

## 2020-12-11 DIAGNOSIS — R739 Hyperglycemia, unspecified: Secondary | ICD-10-CM | POA: Diagnosis not present

## 2020-12-11 DIAGNOSIS — R55 Syncope and collapse: Secondary | ICD-10-CM | POA: Diagnosis not present

## 2020-12-11 DIAGNOSIS — R531 Weakness: Secondary | ICD-10-CM | POA: Diagnosis not present

## 2020-12-11 DIAGNOSIS — R569 Unspecified convulsions: Secondary | ICD-10-CM | POA: Diagnosis not present

## 2020-12-11 MED FILL — Ketamine HCl Inj 100 MG/ML: INTRAMUSCULAR | Qty: 1 | Status: AC

## 2020-12-11 NOTE — Telephone Encounter (Signed)
Transition Care Management Follow-up Telephone Call Date of discharge and from where: 12/10/2020-[facility] How have you been since you were released from the hospital? Patient stated she is doing fine. Any questions or concerns? No  Items Reviewed: Did the pt receive and understand the discharge instructions provided? Yes  Medications obtained and verified? Yes  Other? No  Any new allergies since your discharge? No  Dietary orders reviewed? N/A Do you have support at home? Yes   Home Care and Equipment/Supplies: Were home health services ordered? not applicable If so, what is the name of the agency? N/A  Has the agency set up a time to come to the patient's home? not applicable Were any new equipment or medical supplies ordered?  No What is the name of the medical supply agency? N/A Were you able to get the supplies/equipment? not applicable Do you have any questions related to the use of the equipment or supplies? No  Functional Questionnaire: (I = Independent and D = Dependent) ADLs: I  Bathing/Dressing- I  Meal Prep- I  Eating- I  Maintaining continence- I  Transferring/Ambulation- I  Managing Meds- I  Follow up appointments reviewed:  PCP Hospital f/u appt confirmed? No   Specialist Hospital f/u appt confirmed? No   Are transportation arrangements needed? No  If their condition worsens, is the pt aware to call PCP or go to the Emergency Dept.? Yes Was the patient provided with contact information for the PCP's office or ED? Yes Was to pt encouraged to call back with questions or concerns? Yes

## 2020-12-12 ENCOUNTER — Emergency Department (HOSPITAL_COMMUNITY): Payer: Medicaid Other

## 2020-12-12 ENCOUNTER — Encounter (HOSPITAL_COMMUNITY): Payer: Self-pay

## 2020-12-12 ENCOUNTER — Other Ambulatory Visit: Payer: Self-pay

## 2020-12-12 ENCOUNTER — Inpatient Hospital Stay (HOSPITAL_COMMUNITY)
Admission: EM | Admit: 2020-12-12 | Discharge: 2020-12-16 | DRG: 917 | Disposition: A | Discharge status: Home / Self Care | Payer: Medicaid Other | Attending: Internal Medicine | Admitting: Internal Medicine

## 2020-12-12 DIAGNOSIS — E875 Hyperkalemia: Secondary | ICD-10-CM | POA: Diagnosis present

## 2020-12-12 DIAGNOSIS — Z89511 Acquired absence of right leg below knee: Secondary | ICD-10-CM

## 2020-12-12 DIAGNOSIS — I872 Venous insufficiency (chronic) (peripheral): Secondary | ICD-10-CM | POA: Diagnosis present

## 2020-12-12 DIAGNOSIS — R4182 Altered mental status, unspecified: Secondary | ICD-10-CM | POA: Diagnosis present

## 2020-12-12 DIAGNOSIS — I152 Hypertension secondary to endocrine disorders: Secondary | ICD-10-CM | POA: Diagnosis present

## 2020-12-12 DIAGNOSIS — D649 Anemia, unspecified: Secondary | ICD-10-CM | POA: Diagnosis present

## 2020-12-12 DIAGNOSIS — Z794 Long term (current) use of insulin: Secondary | ICD-10-CM

## 2020-12-12 DIAGNOSIS — Z91018 Allergy to other foods: Secondary | ICD-10-CM

## 2020-12-12 DIAGNOSIS — Z79899 Other long term (current) drug therapy: Secondary | ICD-10-CM

## 2020-12-12 DIAGNOSIS — E1022 Type 1 diabetes mellitus with diabetic chronic kidney disease: Secondary | ICD-10-CM | POA: Diagnosis present

## 2020-12-12 DIAGNOSIS — Z8616 Personal history of COVID-19: Secondary | ICD-10-CM

## 2020-12-12 DIAGNOSIS — E611 Iron deficiency: Secondary | ICD-10-CM | POA: Diagnosis present

## 2020-12-12 DIAGNOSIS — U071 COVID-19: Secondary | ICD-10-CM | POA: Diagnosis present

## 2020-12-12 DIAGNOSIS — I959 Hypotension, unspecified: Secondary | ICD-10-CM | POA: Diagnosis present

## 2020-12-12 DIAGNOSIS — R0902 Hypoxemia: Secondary | ICD-10-CM | POA: Diagnosis not present

## 2020-12-12 DIAGNOSIS — R609 Edema, unspecified: Secondary | ICD-10-CM | POA: Diagnosis not present

## 2020-12-12 DIAGNOSIS — E1159 Type 2 diabetes mellitus with other circulatory complications: Secondary | ICD-10-CM | POA: Diagnosis present

## 2020-12-12 DIAGNOSIS — E8809 Other disorders of plasma-protein metabolism, not elsewhere classified: Secondary | ICD-10-CM | POA: Diagnosis present

## 2020-12-12 DIAGNOSIS — R0689 Other abnormalities of breathing: Secondary | ICD-10-CM | POA: Diagnosis not present

## 2020-12-12 DIAGNOSIS — J849 Interstitial pulmonary disease, unspecified: Secondary | ICD-10-CM | POA: Diagnosis not present

## 2020-12-12 DIAGNOSIS — I739 Peripheral vascular disease, unspecified: Secondary | ICD-10-CM

## 2020-12-12 DIAGNOSIS — Z833 Family history of diabetes mellitus: Secondary | ICD-10-CM

## 2020-12-12 DIAGNOSIS — E739 Lactose intolerance, unspecified: Secondary | ICD-10-CM | POA: Diagnosis present

## 2020-12-12 DIAGNOSIS — E538 Deficiency of other specified B group vitamins: Secondary | ICD-10-CM | POA: Diagnosis present

## 2020-12-12 DIAGNOSIS — E10621 Type 1 diabetes mellitus with foot ulcer: Secondary | ICD-10-CM | POA: Diagnosis present

## 2020-12-12 DIAGNOSIS — R4020 Unspecified coma: Secondary | ICD-10-CM | POA: Diagnosis not present

## 2020-12-12 DIAGNOSIS — K3184 Gastroparesis: Secondary | ICD-10-CM | POA: Diagnosis present

## 2020-12-12 DIAGNOSIS — R569 Unspecified convulsions: Secondary | ICD-10-CM | POA: Diagnosis present

## 2020-12-12 DIAGNOSIS — E1051 Type 1 diabetes mellitus with diabetic peripheral angiopathy without gangrene: Secondary | ICD-10-CM | POA: Diagnosis present

## 2020-12-12 DIAGNOSIS — N1832 Chronic kidney disease, stage 3b: Secondary | ICD-10-CM | POA: Diagnosis present

## 2020-12-12 DIAGNOSIS — R0989 Other specified symptoms and signs involving the circulatory and respiratory systems: Secondary | ICD-10-CM | POA: Diagnosis not present

## 2020-12-12 DIAGNOSIS — Z452 Encounter for adjustment and management of vascular access device: Secondary | ICD-10-CM | POA: Diagnosis not present

## 2020-12-12 DIAGNOSIS — K7200 Acute and subacute hepatic failure without coma: Secondary | ICD-10-CM | POA: Diagnosis present

## 2020-12-12 DIAGNOSIS — E1043 Type 1 diabetes mellitus with diabetic autonomic (poly)neuropathy: Secondary | ICD-10-CM | POA: Diagnosis present

## 2020-12-12 DIAGNOSIS — N179 Acute kidney failure, unspecified: Secondary | ICD-10-CM | POA: Diagnosis not present

## 2020-12-12 DIAGNOSIS — N183 Chronic kidney disease, stage 3 unspecified: Secondary | ICD-10-CM | POA: Diagnosis present

## 2020-12-12 DIAGNOSIS — E1069 Type 1 diabetes mellitus with other specified complication: Secondary | ICD-10-CM | POA: Diagnosis present

## 2020-12-12 DIAGNOSIS — L97521 Non-pressure chronic ulcer of other part of left foot limited to breakdown of skin: Secondary | ICD-10-CM | POA: Diagnosis present

## 2020-12-12 DIAGNOSIS — D631 Anemia in chronic kidney disease: Secondary | ICD-10-CM | POA: Diagnosis present

## 2020-12-12 DIAGNOSIS — I878 Other specified disorders of veins: Secondary | ICD-10-CM | POA: Diagnosis present

## 2020-12-12 DIAGNOSIS — Z23 Encounter for immunization: Secondary | ICD-10-CM

## 2020-12-12 DIAGNOSIS — K219 Gastro-esophageal reflux disease without esophagitis: Secondary | ICD-10-CM | POA: Diagnosis present

## 2020-12-12 DIAGNOSIS — T402X1A Poisoning by other opioids, accidental (unintentional), initial encounter: Principal | ICD-10-CM | POA: Diagnosis present

## 2020-12-12 DIAGNOSIS — J9601 Acute respiratory failure with hypoxia: Secondary | ICD-10-CM | POA: Diagnosis present

## 2020-12-12 DIAGNOSIS — R269 Unspecified abnormalities of gait and mobility: Secondary | ICD-10-CM

## 2020-12-12 DIAGNOSIS — E860 Dehydration: Secondary | ICD-10-CM | POA: Diagnosis present

## 2020-12-12 DIAGNOSIS — R404 Transient alteration of awareness: Secondary | ICD-10-CM | POA: Diagnosis not present

## 2020-12-12 DIAGNOSIS — Z8249 Family history of ischemic heart disease and other diseases of the circulatory system: Secondary | ICD-10-CM

## 2020-12-12 DIAGNOSIS — I5032 Chronic diastolic (congestive) heart failure: Secondary | ICD-10-CM | POA: Diagnosis present

## 2020-12-12 LAB — CBC WITH DIFFERENTIAL/PLATELET
Abs Immature Granulocytes: 0.05 10*3/uL (ref 0.00–0.07)
Basophils Absolute: 0.1 10*3/uL (ref 0.0–0.1)
Basophils Relative: 1 %
Eosinophils Absolute: 0.2 10*3/uL (ref 0.0–0.5)
Eosinophils Relative: 2 %
HCT: 23 % — ABNORMAL LOW (ref 36.0–46.0)
Hemoglobin: 7.2 g/dL — ABNORMAL LOW (ref 12.0–15.0)
Immature Granulocytes: 1 %
Lymphocytes Relative: 14 %
Lymphs Abs: 1.2 10*3/uL (ref 0.7–4.0)
MCH: 30.4 pg (ref 26.0–34.0)
MCHC: 31.3 g/dL (ref 30.0–36.0)
MCV: 97 fL (ref 80.0–100.0)
Monocytes Absolute: 0.8 10*3/uL (ref 0.1–1.0)
Monocytes Relative: 10 %
Neutro Abs: 5.9 10*3/uL (ref 1.7–7.7)
Neutrophils Relative %: 72 %
Platelets: 211 10*3/uL (ref 150–400)
RBC: 2.37 MIL/uL — ABNORMAL LOW (ref 3.87–5.11)
RDW: 13.9 % (ref 11.5–15.5)
WBC: 8.1 10*3/uL (ref 4.0–10.5)
nRBC: 0 % (ref 0.0–0.2)

## 2020-12-12 LAB — I-STAT BETA HCG BLOOD, ED (MC, WL, AP ONLY): I-stat hCG, quantitative: <5 m[IU]/mL (ref <5)

## 2020-12-12 LAB — FIBRINOGEN: Fibrinogen: 611 mg/dL — ABNORMAL HIGH (ref 210–475)

## 2020-12-12 LAB — LACTIC ACID, PLASMA: Lactic Acid, Venous: 1.2 mmol/L (ref 0.5–1.9)

## 2020-12-12 IMAGING — DX DG CHEST 1V PORT
1 series · 1 of 1 positions shown · non-contrast
Comparison: [DATE]

CLINICAL DATA: Found unresponsive.

EXAM:
PORTABLE CHEST 1 VIEW

[chest]
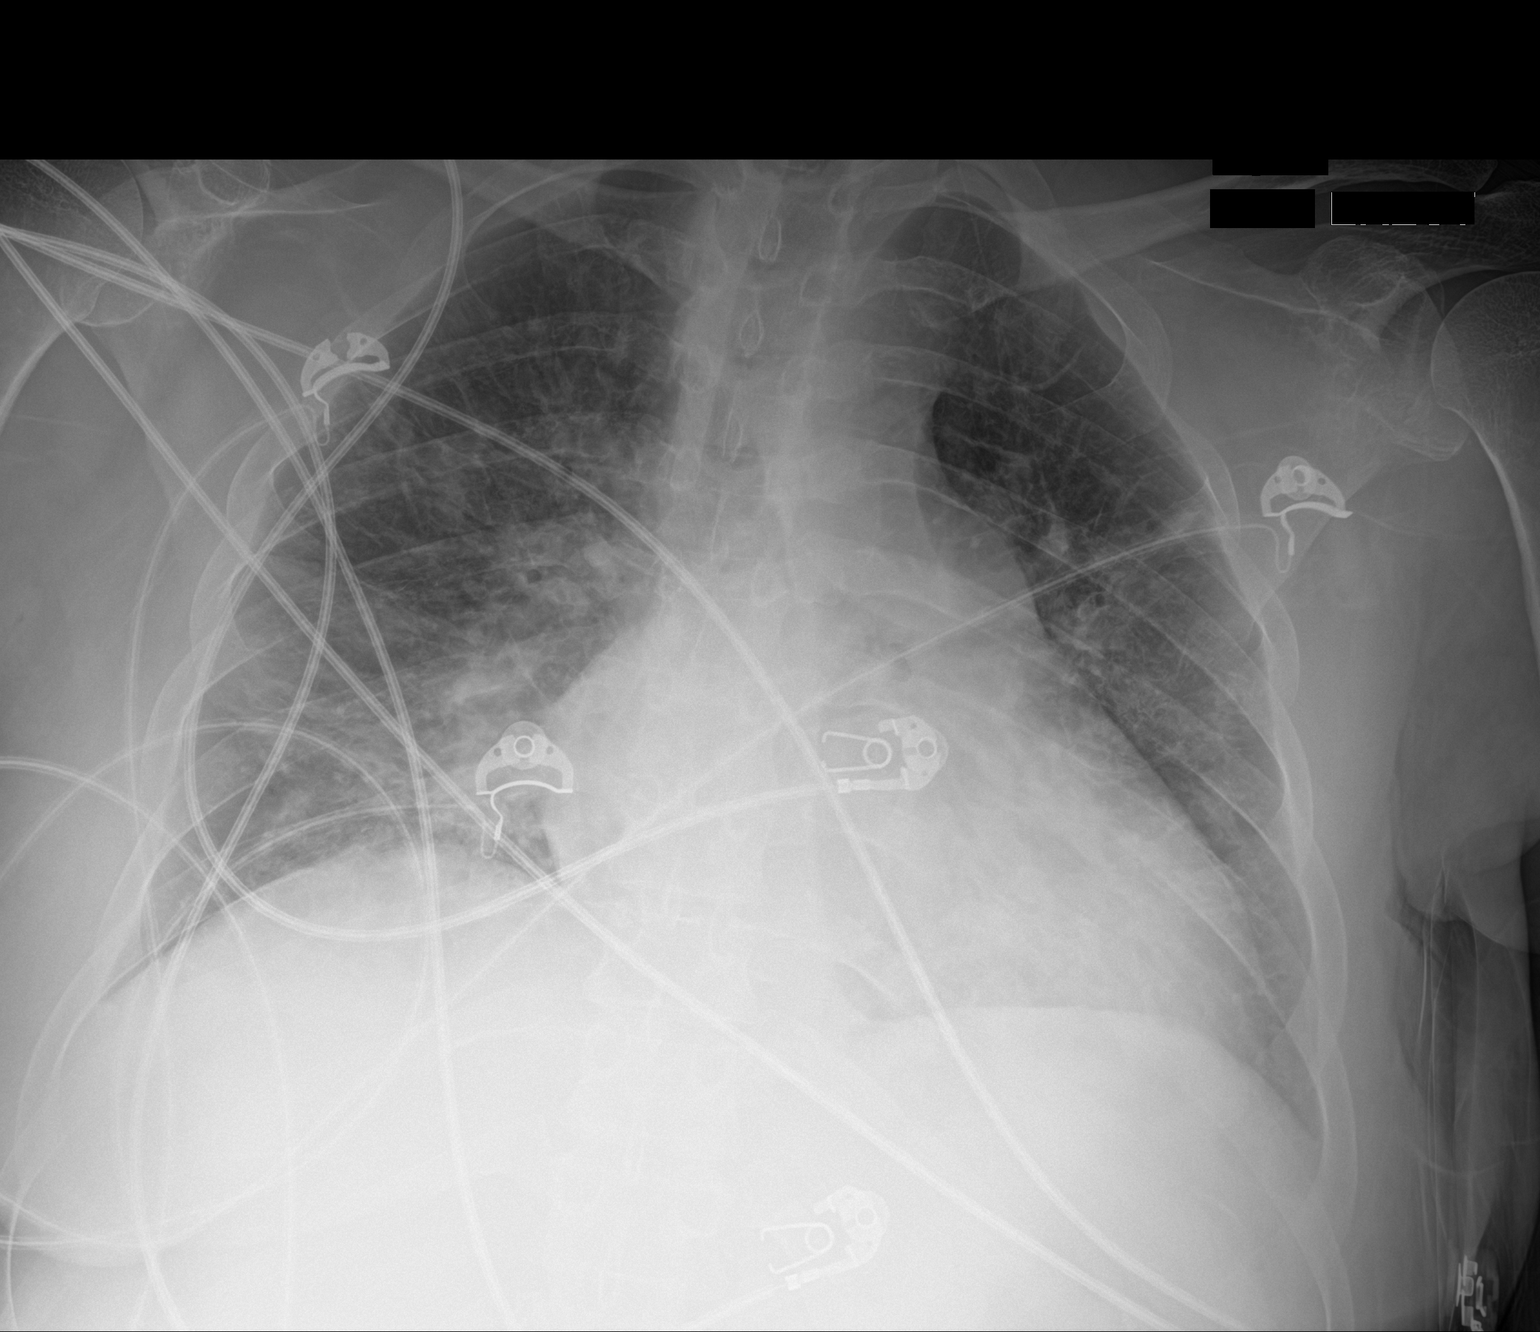

[1 of 1 positions shown; findings below may reference images not displayed]

FINDINGS: The right-sided PICC line seen on the prior study has been removed.
Mild to moderate severity diffusely increased interstitial lung
markings are seen with prominence of the perihilar pulmonary
vasculature. Mild areas of atelectasis and/or infiltrate are also
noted within the bilateral infrahilar regions. There is no evidence
of a pleural effusion or pneumothorax. The cardiac silhouette is
mildly increased in size. The visualized skeletal structures are
unremarkable.
IMPRESSION: Mild to moderate severity pulmonary vascular congestion with mild
bilateral infrahilar atelectasis and/or infiltrate.

## 2020-12-12 NOTE — ED Triage Notes (Signed)
Pt was found unresponsive in bed by spouse with agonal respirations. Pt was given CPR, unsure how many rounds at house, EMS did BVM at home because her O2 was 76% on RA. Pt now oriented x4, but sleepy.

## 2020-12-12 NOTE — ED Provider Notes (Signed)
[facility] EMERGENCY DEPARTMENT Provider Note   CSN: [id number] Arrival date & time: 12/12/20  2222     History Chief Complaint  Patient presents with   [name] is a 30 y.o. female.  Patient with history of T1DM, HTN, GERD, LE amputations, CKD, to ED with report that she was found unresponsive by her husband tonight who administered CPR prior to EMS arrival. Per EMS, found to have pulses on their arrival but O2 saturation 76%, improved with oxygen. The patient is awake and oriented on arrival and reports feeling unwell all day today described "lethargic and generally weak. No chest pain, nausea, vomiting or known fever. Recent hospitalization for new onset seizure, PRES, developed COVID while in the hospital, discharged home 9/12. Doing well until today. She reports cough since discharge.  The history is provided by the EMS personnel and the patient.      Past Medical History:  Diagnosis Date   Allergy    Anemia    Anxiety    Blood transfusion without reported diagnosis    Dec 2018   Cataract    right eye   COVID-19 03/2020   Depression    Diabetes type 1, uncontrolled ([place]) 11/14/2011   Since age 30   Fibromyalgia    Gastroparesis    GERD (gastroesophageal reflux disease)    Hypertension    Infection    UTI April 2016    Patient Active Problem List   Diagnosis Date Noted   Agitation    Malnutrition of moderate degree 12/04/2020   Seizure ([place])    Delirium    Hypertensive emergency    PRES (posterior reversible encephalopathy syndrome)    Respiratory failure ([place]) 12/03/2020   Type 1 diabetes mellitus with ketoacidosis without coma ([place]) 11/15/2020   Insomnia 11/15/2020   Iron deficiency 04/11/2020   Osteomyelitis ([place]) 04/10/2020   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 ([place]) 02/03/2020   AKI (acute kidney injury) ([place]) 09/29/2019   Cannabinoid hyperemesis syndrome 04/30/2019   Contraception management  08/06/2018   Diabetic gastroparesis associated with type 1 diabetes mellitus ([place]) 06/04/2018   Anxiety 05/31/2018   Peripheral edema 05/31/2018   Chronic anemia 07/10/2017   GERD (gastroesophageal reflux disease) 06/23/2017   MDD (major depressive disorder), recurrent episode, mild ([place]) 02/28/2017   Severe protein-calorie malnutrition ([place]) 04/28/2013   Hypertension associated with diabetes ([place]) 11/14/2011    Past Surgical History:  Procedure Laterality Date   AMPUTATION Right 04/13/2020   Procedure: 1ST RAY AMPUTATION RIGHT FOOT;  Surgeon: Newt Minion, MD;  Location: [place] [facility];  Service: Orthopedics;  Laterality: Right;   AMPUTATION Right 06/06/2020   Procedure: RIGHT BELOW KNEE AMPUTATION;  Surgeon: Newt Minion, MD;  Location: [facility];  Service: Orthopedics;  Laterality: Right;   AMPUTATION Left    great toe   ANKLE SURGERY     APPLICATION OF WOUND VAC Right 06/06/2020   Procedure: APPLICATION OF WOUND VAC;  Surgeon: Newt Minion, MD;  Location: [facility];  Service: Orthopedics;  Laterality: Right;   CHOLECYSTECTOMY  11/15/2011   Procedure: LAPAROSCOPIC CHOLECYSTECTOMY WITH INTRAOPERATIVE CHOLANGIOGRAM;  Surgeon: [name], MD;  Location: [facility];  Service: General;  Laterality: N/A;   COLONOSCOPY     COLONOSCOPY WITH PROPOFOL N/A 06/27/2017   Procedure: COLONOSCOPY WITH PROPOFOL;  Surgeon: Milus Banister, MD;  Location: [facility];  Service: Endoscopy;  Laterality: N/A;   ESOPHAGOGASTRODUODENOSCOPY  12/03/2011  Procedure: ESOPHAGOGASTRODUODENOSCOPY (EGD);  Surgeon: Beryle Beams, MD;  Location: [facility];  Service: Endoscopy;  Laterality: N/A;   FLEXIBLE SIGMOIDOSCOPY N/A 03/10/2017   Procedure: FLEXIBLE SIGMOIDOSCOPY;  Surgeon: Carol Ada, MD;  Location: [facility];  Service: Endoscopy;  Laterality: N/A;   INCISION AND DRAINAGE PERIRECTAL ABSCESS N/A 03/01/2017   Procedure: IRRIGATION AND DEBRIDEMENT PERIRECTAL ABSCESS;  Surgeon: Alphonsa Overall, MD;  Location:  [facility];  Service: General;  Laterality: N/A;   IRRIGATION AND DEBRIDEMENT BUTTOCKS N/A 03/23/2017   Procedure: IRRIGATION AND DEBRIDEMENT BUTTOCKS, SETON PLACEMENT;  Surgeon: [name], MD;  Location: [facility];  Service: General;  Laterality: N/A;   LAPAROSCOPY  11/23/2011   Procedure: LAPAROSCOPY DIAGNOSTIC;  Surgeon: Edward Jolly, MD;  Location: [facility];  Service: General;  Laterality: N/A;   SIGMOIDOSCOPY     UPPER GASTROINTESTINAL ENDOSCOPY     WISDOM TOOTH EXTRACTION       OB History     Gravida  1   Para  1   Term      Preterm  1   AB      Living  1      SAB      IAB      Ectopic      Multiple  0   Live Births  1           Family History  Problem Relation Age of Onset   Diabetes Mother    Hypertension Father    Colon cancer Paternal Grandmother        pt thinks PGM was dx in her 3's   Diabetes Paternal Grandmother    Diabetes Maternal Grandmother    Diabetes Maternal Grandfather    Diabetes Paternal Grandfather    Diabetes Other    Breast cancer Paternal Aunt    Esophageal cancer Neg Hx    Liver cancer Neg Hx    Pancreatic cancer Neg Hx    Stomach cancer Neg Hx    Rectal cancer Neg Hx     Social History   Tobacco Use   Smoking status: Never   Smokeless tobacco: Never  Vaping Use   Vaping Use: Never used  Substance Use Topics   Alcohol use: No    Alcohol/week: 0.0 standard drinks   Drug use: Not Currently    Types: Marijuana    Home Medications Prior to Admission medications   Medication Sig Start Date End Date Taking? Authorizing Provider  Albuterol Sulfate (PROAIR RESPICLICK) 570 (90 Base) MCG/ACT AEPB Inhale 2 puffs into the lungs daily as needed. Patient taking differently: Inhale 2 puffs into the lungs daily as needed (wheezing). 11/08/20 12/08/20  Allwardt, Randa Evens, PA-C  amLODipine (NORVASC) 10 MG tablet Take 1 tablet (10 mg total) by mouth daily. 11/08/20   Allwardt, Randa Evens, PA-C  ascorbic acid (VITAMIN C) 500 MG  tablet Take 1 tablet (500 mg total) by mouth daily. 12/10/20   Sheikh, Georgina Quint Latif, DO  busPIRone (BUSPAR) 5 MG tablet Take 1 tablet (5 mg total) by mouth 3 (three) times daily. 02/03/20   [name], MD  Carboxymethylcell-Hypromellose (GENTEAL) 0.25-0.3 % GEL Place 1 drop into both eyes daily as needed (dryeyes).    [provider]  carvedilol (COREG) 12.5 MG tablet Take 1 tablet (12.5 mg total) by mouth 2 (two) times daily with a meal. 04/16/20 02/02/21  Dahal, Marlowe Aschoff, MD  Continuous Blood Gluc Sensor (DEXCOM G6 SENSOR) MISC USE AS DIRECTED EVERY 10 DAYS 07/28/20  Renato Shin, MD  Continuous Blood Gluc Transmit (DEXCOM G6 TRANSMITTER) MISC USE AS DIRECTED TO  MONITOR  GLUCOSE  LEVELS 05/10/20   Renato Shin, MD  cyclobenzaprine (FLEXERIL) 10 MG tablet Take 1 tablet (10 mg total) by mouth 3 (three) times daily as needed for muscle spasms. 11/15/20   [name], MD  dicyclomine (BENTYL) 20 MG tablet Take 1 tablet (20 mg total) by mouth every 8 (eight) hours as needed for spasms. 11/25/19   Petrucelli, Samantha R, PA-C  EPINEPHrine 0.3 mg/0.3 mL IJ SOAJ injection Inject 0.3 mg into the muscle once as needed (anaphylaxis/allergic reaction). Patient taking differently: Inject 0.3 mg into the muscle once as needed for anaphylaxis. 07/16/20   [name], MD  gabapentin (NEURONTIN) 100 MG capsule Take 200 mg by mouth 3 (three) times daily. 11/02/20   [provider]  guaiFENesin (MUCINEX) 600 MG 12 hr tablet Take 1 tablet (600 mg total) by mouth 2 (two) times daily for 5 days. 12/10/20 12/15/20  Raiford Noble Latif, DO  hydrALAZINE (APRESOLINE) 50 MG tablet Take 100 mg by mouth 2 (two) times daily. 09/14/20   [provider]  insulin glargine (LANTUS) 100 UNIT/ML injection Inject 0.05 mLs (5 Units total) into the skin daily. 12/10/20   Raiford Noble Latif, DO  insulin lispro (HUMALOG) 100 UNIT/ML injection Inject 0-5 Units into the skin 3 (three) times daily before meals.  Sliding scale    [provider]  Insulin Pen Needle 32G X 4 MM MISC USE AS DIRECTED WITH INSULIN PENS 05/24/20 05/24/21  Mercy Riding, MD  Ipratropium-Albuterol (COMBIVENT) 20-100 MCG/ACT AERS respimat Inhale 1 puff into the lungs every 6 (six) hours as needed for wheezing or shortness of breath. 12/10/20   Raiford Noble Latif, DO  levETIRAcetam (KEPPRA) 500 MG tablet Take 1 tablet (500 mg total) by mouth 2 (two) times daily. 12/10/20   Raiford Noble Latif, DO  loperamide (IMODIUM) 2 MG capsule Take 6 mg by mouth daily as needed for diarrhea or loose stools.     [provider]  LORazepam (ATIVAN) 0.5 MG tablet Take 1 tablet (0.5 mg total) by mouth 2 (two) times daily. 11/15/20   [name], MD  metoCLOPramide (REGLAN) 5 MG tablet Take 1 tablet (5 mg total) by mouth 3 (three) times daily before meals. Please make and keep an appointment for further refills. 11/13/20 02/11/21  [place] Eric J, DO  nirmatrelvir/ritonavir EUA, renal dosing, (PAXLOVID) 10 x 150 MG & 10 x 100MG TABS Take 2 tablets by mouth 2 (two) times daily for 5 days. Patient GFR is 35. Take nirmatrelvir (150 mg) one tablet twice daily for 5 days and ritonavir (100 mg) one tablet twice daily for 5 days. 12/10/20 12/15/20  Raiford Noble Latif, DO  Nutritional Supplements (,FEEDING SUPPLEMENT, PROSOURCE PLUS) liquid Take 30 mLs by mouth 2 (two) times daily between meals. 12/10/20   Raiford Noble Latif, DO  ondansetron (ZOFRAN ODT) 4 MG disintegrating tablet Take 1 tablet (4 mg total) by mouth every 8 (eight) hours as needed for nausea or vomiting. 11/08/20   Allwardt, Randa Evens, PA-C  oxyCODONE-acetaminophen (PERCOCET) 10-325 MG tablet Take 1 tablet by mouth every 4 (four) hours as needed for pain. 12/10/20   [name], MD  traZODone (DESYREL) 50 MG tablet Take 0.5-1 tablets (25-50 mg total) by mouth at bedtime as needed for sleep. 11/15/20   [name], MD  zinc sulfate 220 (50 Zn) MG capsule Take 1 capsule (  220 mg  total) by mouth daily. 12/10/20   Raiford Noble Latif, DO  potassium chloride SA (KLOR-CON) 20 MEQ tablet Take 1 tablet (20 mEq total) by mouth daily for 5 doses. 07/04/19 07/30/19  Janeece Fitting, PA-C    Allergies    Other and Lactose intolerance (gi)  Review of Systems   Review of Systems  Constitutional:  Negative for chills and fever.  HENT: Negative.    Eyes:  Negative for visual disturbance.  Respiratory:  Positive for cough and shortness of breath.   Cardiovascular: Negative.  Negative for chest pain.  Gastrointestinal: Negative.  Negative for diarrhea and vomiting.  Musculoskeletal: Negative.   Skin: Negative.   Neurological:  Positive for weakness.   Physical Exam Updated Vital Signs BP 114/76   Pulse 91   Temp 97.9 F (36.6 C) (Oral)   Resp (!) 22   Ht 5' 7"  (1.702 m)   Wt 72.6 kg   SpO2 100%   BMI 25.06 kg/m   Physical Exam Vitals and nursing note reviewed.  Constitutional:      Appearance: Normal appearance. She is well-developed.  HENT:     Head: Normocephalic.  Cardiovascular:     Rate and Rhythm: Normal rate and regular rhythm.     Heart sounds: No murmur heard. Pulmonary:     Effort: Pulmonary effort is normal.     Breath sounds: Normal breath sounds. No wheezing, rhonchi or rales.  Abdominal:     General: Bowel sounds are normal.     Palpations: Abdomen is soft.     Tenderness: There is no abdominal tenderness. There is no guarding or rebound.  Musculoskeletal:        General: Normal range of motion.     Cervical back: Normal range of motion and neck supple.     Comments: Right BKA, left Great toe amputation, no edema  Skin:    General: Skin is warm and dry.  Neurological:     General: No focal deficit present.     Mental Status: She is alert and oriented to person, place, and time.    ED Results / Procedures / Treatments   Labs (all labs ordered are listed, but only abnormal results are displayed) Labs Reviewed  CBC WITH DIFFERENTIAL/PLATELET   COMPREHENSIVE METABOLIC PANEL  URINALYSIS, ROUTINE W REFLEX MICROSCOPIC  I-STAT BETA HCG BLOOD, ED ([facility], [facility], [facility] ONLY)  TROPONIN I (HIGH SENSITIVITY)    EKG None  Radiology No results found.  Procedures Procedures   Medications Ordered in ED Medications - No data to display  ED Course  I have reviewed the triage vital signs and the nursing notes.  Pertinent labs & imaging results that were available during my care of the patient were reviewed by me and considered in my medical decision making (see chart for details).    MDM Rules/Calculators/A&P                         CRITICAL CARE Performed by: Dewaine Oats   Total critical care time: 40 minutes  Critical care time was exclusive of separately billable procedures and treating other patients.  Critical care was necessary to treat or prevent imminent or life-threatening deterioration.  Critical care was time spent personally by me on the following activities: development of treatment plan with patient and/or surrogate as well as nursing, discussions with consultants, evaluation of patient's response to treatment, examination of patient, obtaining history from patient or surrogate, ordering  and performing treatments and interventions, ordering and review of laboratory studies, ordering and review of radiographic studies, pulse oximetry and re-evaluation of patient's condition.   Patient to ED by EMS, found unresponsive by husband, responsive on EMS arrival but hypoxic. Patient reports feeling generally weak and lethargic all day today. No vomiting, no pain.  Initial EKG concerning for inferolateral ST elevations, absent on repeat study. No chest pain. Reviewed with cardiology - no STEMI.   Labs pending. Consider CTA r/o PE.  Chart reviewed. Negative DVT study 9/11, negative VQ scan 9/12. D-dimer tonight 4.42, was >20 on previous hospitalization. Do not feel repeat study is indicated.   She has been weaned down to 4L and  is maintaining 97%. Sleeping, easy to waken. She has been seen by Dr. Christy Gentles.   CXR concerning for enlarged heart when compared to previous. Question if presentation today is related to complications from recent COVID infection. BP soft at 101/62. Minimal bolus of 500 cc provided. Will discuss with hospitalist for re-admission.   Final Clinical Impression(s) / ED Diagnoses Final diagnoses:  None   Hypoxia Hyperkalemia Cardiomegaly  AKI  Rx / DC Orders ED Discharge Orders     None        Dennie Bible 12/13/20 0222    Truddie Hidden, MD 12/13/20 1059

## 2020-12-13 ENCOUNTER — Inpatient Hospital Stay (HOSPITAL_COMMUNITY): Payer: Medicaid Other

## 2020-12-13 DIAGNOSIS — U071 COVID-19: Secondary | ICD-10-CM

## 2020-12-13 DIAGNOSIS — E860 Dehydration: Secondary | ICD-10-CM | POA: Diagnosis not present

## 2020-12-13 DIAGNOSIS — N179 Acute kidney failure, unspecified: Secondary | ICD-10-CM

## 2020-12-13 DIAGNOSIS — R569 Unspecified convulsions: Secondary | ICD-10-CM | POA: Diagnosis not present

## 2020-12-13 DIAGNOSIS — E1043 Type 1 diabetes mellitus with diabetic autonomic (poly)neuropathy: Secondary | ICD-10-CM | POA: Diagnosis not present

## 2020-12-13 DIAGNOSIS — R0902 Hypoxemia: Secondary | ICD-10-CM

## 2020-12-13 DIAGNOSIS — E1159 Type 2 diabetes mellitus with other circulatory complications: Secondary | ICD-10-CM | POA: Diagnosis not present

## 2020-12-13 DIAGNOSIS — Z89511 Acquired absence of right leg below knee: Secondary | ICD-10-CM | POA: Diagnosis not present

## 2020-12-13 DIAGNOSIS — D649 Anemia, unspecified: Secondary | ICD-10-CM

## 2020-12-13 DIAGNOSIS — I428 Other cardiomyopathies: Secondary | ICD-10-CM

## 2020-12-13 DIAGNOSIS — E10621 Type 1 diabetes mellitus with foot ulcer: Secondary | ICD-10-CM | POA: Diagnosis not present

## 2020-12-13 DIAGNOSIS — E611 Iron deficiency: Secondary | ICD-10-CM | POA: Diagnosis not present

## 2020-12-13 DIAGNOSIS — I152 Hypertension secondary to endocrine disorders: Secondary | ICD-10-CM

## 2020-12-13 DIAGNOSIS — J9601 Acute respiratory failure with hypoxia: Secondary | ICD-10-CM

## 2020-12-13 DIAGNOSIS — J849 Interstitial pulmonary disease, unspecified: Secondary | ICD-10-CM | POA: Diagnosis not present

## 2020-12-13 DIAGNOSIS — E43 Unspecified severe protein-calorie malnutrition: Secondary | ICD-10-CM | POA: Diagnosis not present

## 2020-12-13 DIAGNOSIS — Z452 Encounter for adjustment and management of vascular access device: Secondary | ICD-10-CM | POA: Diagnosis not present

## 2020-12-13 DIAGNOSIS — I5032 Chronic diastolic (congestive) heart failure: Secondary | ICD-10-CM | POA: Diagnosis present

## 2020-12-13 DIAGNOSIS — E1069 Type 1 diabetes mellitus with other specified complication: Secondary | ICD-10-CM

## 2020-12-13 DIAGNOSIS — R4182 Altered mental status, unspecified: Secondary | ICD-10-CM | POA: Diagnosis not present

## 2020-12-13 DIAGNOSIS — I878 Other specified disorders of veins: Secondary | ICD-10-CM | POA: Diagnosis not present

## 2020-12-13 DIAGNOSIS — T402X1A Poisoning by other opioids, accidental (unintentional), initial encounter: Secondary | ICD-10-CM | POA: Diagnosis not present

## 2020-12-13 DIAGNOSIS — N183 Chronic kidney disease, stage 3 unspecified: Secondary | ICD-10-CM

## 2020-12-13 DIAGNOSIS — K7200 Acute and subacute hepatic failure without coma: Secondary | ICD-10-CM | POA: Diagnosis not present

## 2020-12-13 DIAGNOSIS — R400 Somnolence: Secondary | ICD-10-CM | POA: Diagnosis not present

## 2020-12-13 DIAGNOSIS — E875 Hyperkalemia: Secondary | ICD-10-CM | POA: Diagnosis not present

## 2020-12-13 DIAGNOSIS — E1022 Type 1 diabetes mellitus with diabetic chronic kidney disease: Secondary | ICD-10-CM

## 2020-12-13 DIAGNOSIS — I739 Peripheral vascular disease, unspecified: Secondary | ICD-10-CM | POA: Diagnosis not present

## 2020-12-13 DIAGNOSIS — K3184 Gastroparesis: Secondary | ICD-10-CM | POA: Diagnosis not present

## 2020-12-13 DIAGNOSIS — N1832 Chronic kidney disease, stage 3b: Secondary | ICD-10-CM | POA: Diagnosis not present

## 2020-12-13 DIAGNOSIS — Z8616 Personal history of COVID-19: Secondary | ICD-10-CM | POA: Diagnosis not present

## 2020-12-13 DIAGNOSIS — E8809 Other disorders of plasma-protein metabolism, not elsewhere classified: Secondary | ICD-10-CM | POA: Diagnosis not present

## 2020-12-13 DIAGNOSIS — D631 Anemia in chronic kidney disease: Secondary | ICD-10-CM | POA: Diagnosis not present

## 2020-12-13 DIAGNOSIS — L97521 Non-pressure chronic ulcer of other part of left foot limited to breakdown of skin: Secondary | ICD-10-CM | POA: Diagnosis not present

## 2020-12-13 DIAGNOSIS — I959 Hypotension, unspecified: Secondary | ICD-10-CM | POA: Diagnosis not present

## 2020-12-13 DIAGNOSIS — Z23 Encounter for immunization: Secondary | ICD-10-CM | POA: Diagnosis not present

## 2020-12-13 DIAGNOSIS — R0989 Other specified symptoms and signs involving the circulatory and respiratory systems: Secondary | ICD-10-CM | POA: Diagnosis not present

## 2020-12-13 DIAGNOSIS — E1051 Type 1 diabetes mellitus with diabetic peripheral angiopathy without gangrene: Secondary | ICD-10-CM | POA: Diagnosis not present

## 2020-12-13 DIAGNOSIS — E538 Deficiency of other specified B group vitamins: Secondary | ICD-10-CM | POA: Diagnosis not present

## 2020-12-13 LAB — CBC
HCT: 23.1 % — ABNORMAL LOW (ref 36.0–46.0)
Hemoglobin: 6.8 g/dL — CL (ref 12.0–15.0)
MCH: 29.3 pg (ref 26.0–34.0)
MCHC: 29.4 g/dL — ABNORMAL LOW (ref 30.0–36.0)
MCV: 99.6 fL (ref 80.0–100.0)
Platelets: 211 10*3/uL (ref 150–400)
RBC: 2.32 MIL/uL — ABNORMAL LOW (ref 3.87–5.11)
RDW: 13.9 % (ref 11.5–15.5)
WBC: 6.9 10*3/uL (ref 4.0–10.5)
nRBC: 0 % (ref 0.0–0.2)

## 2020-12-13 LAB — VITAMIN B12: Vitamin B-12: 334 pg/mL (ref 180–914)

## 2020-12-13 LAB — COMPREHENSIVE METABOLIC PANEL
ALT: 62 U/L — ABNORMAL HIGH (ref 0–44)
AST: 134 U/L — ABNORMAL HIGH (ref 15–41)
Albumin: 2 g/dL — ABNORMAL LOW (ref 3.5–5.0)
Alkaline Phosphatase: 247 U/L — ABNORMAL HIGH (ref 38–126)
Anion gap: 9 (ref 5–15)
BUN: 61 mg/dL — ABNORMAL HIGH (ref 6–20)
CO2: 16 mmol/L — ABNORMAL LOW (ref 22–32)
Calcium: 8.5 mg/dL — ABNORMAL LOW (ref 8.9–10.3)
Chloride: 109 mmol/L (ref 98–111)
Creatinine, Ser: 2.5 mg/dL — ABNORMAL HIGH (ref 0.44–1.00)
GFR, Estimated: 26 mL/min — ABNORMAL LOW (ref >60)
Glucose, Bld: 217 mg/dL — ABNORMAL HIGH (ref 70–99)
Potassium: 5.6 mmol/L — ABNORMAL HIGH (ref 3.5–5.1)
Sodium: 134 mmol/L — ABNORMAL LOW (ref 135–145)
Total Bilirubin: 0.5 mg/dL (ref 0.3–1.2)
Total Protein: 6.1 g/dL — ABNORMAL LOW (ref 6.5–8.1)

## 2020-12-13 LAB — PREPARE RBC (CROSSMATCH)

## 2020-12-13 LAB — ECHOCARDIOGRAM LIMITED
AV Peak grad: 11.3 mmHg
Ao pk vel: 1.68 m/s
Area-P 1/2: 3.74 cm2
Height: 67 in
S' Lateral: 3.1 cm
Single Plane A4C EF: 64.2 %
Weight: 2560 oz

## 2020-12-13 LAB — IRON AND TIBC
Iron: 12 ug/dL — ABNORMAL LOW (ref 28–170)
Saturation Ratios: 5 % — ABNORMAL LOW (ref 10.4–31.8)
TIBC: 230 ug/dL — ABNORMAL LOW (ref 250–450)
UIBC: 218 ug/dL

## 2020-12-13 LAB — TROPONIN I (HIGH SENSITIVITY)
Troponin I (High Sensitivity): 13 ng/L (ref <18)
Troponin I (High Sensitivity): 9 ng/L (ref <18)

## 2020-12-13 LAB — RETICULOCYTES
Immature Retic Fract: 17.4 % — ABNORMAL HIGH (ref 2.3–15.9)
RBC.: 2.32 MIL/uL — ABNORMAL LOW (ref 3.87–5.11)
Retic Count, Absolute: 68.2 10*3/uL (ref 19.0–186.0)
Retic Ct Pct: 2.9 % (ref 0.4–3.1)

## 2020-12-13 LAB — I-STAT ARTERIAL BLOOD GAS, ED
Acid-base deficit: 9 mmol/L — ABNORMAL HIGH (ref 0.0–2.0)
Bicarbonate: 17.4 mmol/L — ABNORMAL LOW (ref 20.0–28.0)
Calcium, Ion: 1.26 mmol/L (ref 1.15–1.40)
HCT: 22 % — ABNORMAL LOW (ref 36.0–46.0)
Hemoglobin: 7.5 g/dL — ABNORMAL LOW (ref 12.0–15.0)
O2 Saturation: 92 %
Patient temperature: 98.4
Potassium: 5.4 mmol/L — ABNORMAL HIGH (ref 3.5–5.1)
Sodium: 135 mmol/L (ref 135–145)
TCO2: 19 mmol/L — ABNORMAL LOW (ref 22–32)
pCO2 arterial: 40.7 mmHg (ref 32.0–48.0)
pH, Arterial: 7.238 — ABNORMAL LOW (ref 7.350–7.450)
pO2, Arterial: 74 mmHg — ABNORMAL LOW (ref 83.0–108.0)

## 2020-12-13 LAB — BASIC METABOLIC PANEL
Anion gap: 7 (ref 5–15)
BUN: 62 mg/dL — ABNORMAL HIGH (ref 6–20)
CO2: 16 mmol/L — ABNORMAL LOW (ref 22–32)
Calcium: 8.4 mg/dL — ABNORMAL LOW (ref 8.9–10.3)
Chloride: 110 mmol/L (ref 98–111)
Creatinine, Ser: 2.48 mg/dL — ABNORMAL HIGH (ref 0.44–1.00)
GFR, Estimated: 26 mL/min — ABNORMAL LOW (ref >60)
Glucose, Bld: 212 mg/dL — ABNORMAL HIGH (ref 70–99)
Potassium: 5.4 mmol/L — ABNORMAL HIGH (ref 3.5–5.1)
Sodium: 133 mmol/L — ABNORMAL LOW (ref 135–145)

## 2020-12-13 LAB — D-DIMER, QUANTITATIVE: D-Dimer, Quant: 4.42 ug/mL-FEU — ABNORMAL HIGH (ref 0.00–0.50)

## 2020-12-13 LAB — LACTATE DEHYDROGENASE: LDH: 260 U/L — ABNORMAL HIGH (ref 98–192)

## 2020-12-13 LAB — TRIGLYCERIDES: Triglycerides: 143 mg/dL (ref <150)

## 2020-12-13 LAB — ACETAMINOPHEN LEVEL: Acetaminophen (Tylenol), Serum: <10 ug/mL — ABNORMAL LOW (ref 10–30)

## 2020-12-13 LAB — GLUCOSE, CAPILLARY
Glucose-Capillary: 102 mg/dL — ABNORMAL HIGH (ref 70–99)
Glucose-Capillary: 165 mg/dL — ABNORMAL HIGH (ref 70–99)

## 2020-12-13 LAB — CBG MONITORING, ED
Glucose-Capillary: 204 mg/dL — ABNORMAL HIGH (ref 70–99)
Glucose-Capillary: 177 mg/dL — ABNORMAL HIGH (ref 70–99)

## 2020-12-13 LAB — C-REACTIVE PROTEIN: CRP: 8.4 mg/dL — ABNORMAL HIGH (ref <1.0)

## 2020-12-13 LAB — FERRITIN: Ferritin: 673 ng/mL — ABNORMAL HIGH (ref 11–307)

## 2020-12-13 LAB — LACTIC ACID, PLASMA: Lactic Acid, Venous: 0.9 mmol/L (ref 0.5–1.9)

## 2020-12-13 LAB — PROCALCITONIN: Procalcitonin: 0.73 ng/mL

## 2020-12-13 LAB — BRAIN NATRIURETIC PEPTIDE: B Natriuretic Peptide: 1104.9 pg/mL — ABNORMAL HIGH (ref 0.0–100.0)

## 2020-12-13 LAB — FOLATE: Folate: 26.6 ng/mL (ref >5.9)

## 2020-12-13 MED ORDER — LACTATED RINGERS IV SOLN: INTRAVENOUS | Status: DC

## 2020-12-13 MED ORDER — FERROUS SULFATE 325 (65 FE) MG PO TABS
325.0000 mg | ORAL_TABLET | Freq: Three times a day (TID) | ORAL | Status: DC
  Administered 2020-12-13 – 2020-12-16 (×9): 325 mg via ORAL
  Filled 2020-12-13 (×9): qty 1

## 2020-12-13 MED ORDER — SODIUM CHLORIDE 0.9% IV SOLUTION: Freq: Once | INTRAVENOUS | Status: DC

## 2020-12-13 MED ORDER — HYDRALAZINE HCL 20 MG/ML IJ SOLN: 10.0000 mg | Freq: Four times a day (QID) | INTRAMUSCULAR | Status: DC | PRN

## 2020-12-13 MED ORDER — ALBUTEROL SULFATE HFA 108 (90 BASE) MCG/ACT IN AERS: 2.0000 | INHALATION_SPRAY | Freq: Every day | RESPIRATORY_TRACT | Status: DC | PRN

## 2020-12-13 MED ORDER — METOCLOPRAMIDE HCL 10 MG PO TABS
5.0000 mg | ORAL_TABLET | Freq: Three times a day (TID) | ORAL | Status: DC
  Administered 2020-12-13 – 2020-12-16 (×11): 5 mg via ORAL
  Filled 2020-12-13 (×11): qty 1

## 2020-12-13 MED ORDER — SODIUM CHLORIDE 0.9 % IV BOLUS
500.0000 mL | Freq: Once | INTRAVENOUS | Status: AC
  Administered 2020-12-13: 500 mL via INTRAVENOUS

## 2020-12-13 MED ORDER — ASCORBIC ACID 500 MG PO TABS
500.0000 mg | ORAL_TABLET | Freq: Every day | ORAL | Status: DC
  Administered 2020-12-13 – 2020-12-16 (×4): 500 mg via ORAL
  Filled 2020-12-13 (×4): qty 1

## 2020-12-13 MED ORDER — GABAPENTIN 100 MG PO CAPS
200.0000 mg | ORAL_CAPSULE | Freq: Three times a day (TID) | ORAL | Status: DC
  Administered 2020-12-13: 200 mg via ORAL
  Filled 2020-12-13: qty 2

## 2020-12-13 MED ORDER — LORAZEPAM 0.5 MG PO TABS
0.5000 mg | ORAL_TABLET | Freq: Two times a day (BID) | ORAL | Status: DC
  Administered 2020-12-13 – 2020-12-16 (×7): 0.5 mg via ORAL
  Filled 2020-12-13 (×7): qty 1

## 2020-12-13 MED ORDER — ONDANSETRON 4 MG PO TBDP: 4.0000 mg | ORAL_TABLET | Freq: Three times a day (TID) | ORAL | Status: DC | PRN

## 2020-12-13 MED ORDER — SODIUM ZIRCONIUM CYCLOSILICATE 10 G PO PACK
10.0000 g | PACK | Freq: Two times a day (BID) | ORAL | Status: AC
  Administered 2020-12-13 (×2): 10 g via ORAL
  Filled 2020-12-13 (×2): qty 1

## 2020-12-13 MED ORDER — CYCLOBENZAPRINE HCL 5 MG PO TABS: 5.0000 mg | ORAL_TABLET | Freq: Three times a day (TID) | ORAL | Status: DC | PRN

## 2020-12-13 MED ORDER — ALBUMIN HUMAN 25 % IV SOLN
25.0000 g | Freq: Once | INTRAVENOUS | Status: AC
  Administered 2020-12-13: 25 g via INTRAVENOUS
  Filled 2020-12-13: qty 100

## 2020-12-13 MED ORDER — VITAMIN B-12 1000 MCG PO TABS
1000.0000 ug | ORAL_TABLET | Freq: Every day | ORAL | Status: DC
  Administered 2020-12-13 – 2020-12-16 (×4): 1000 ug via ORAL
  Filled 2020-12-13 (×4): qty 1

## 2020-12-13 MED ORDER — NIRMATRELVIR/RITONAVIR (PAXLOVID) TABLET (RENAL DOSING)
2.0000 | ORAL_TABLET | Freq: Two times a day (BID) | ORAL | Status: DC
  Administered 2020-12-13: 2 via ORAL

## 2020-12-13 MED ORDER — ZINC SULFATE 220 (50 ZN) MG PO CAPS
220.0000 mg | ORAL_CAPSULE | Freq: Every day | ORAL | Status: DC
  Administered 2020-12-13 – 2020-12-16 (×4): 220 mg via ORAL
  Filled 2020-12-13 (×4): qty 1

## 2020-12-13 MED ORDER — IPRATROPIUM-ALBUTEROL 20-100 MCG/ACT IN AERS: 1.0000 | INHALATION_SPRAY | Freq: Four times a day (QID) | RESPIRATORY_TRACT | Status: DC | PRN

## 2020-12-13 MED ORDER — ONDANSETRON HCL 4 MG PO TABS
4.0000 mg | ORAL_TABLET | Freq: Four times a day (QID) | ORAL | Status: DC | PRN
Start: 2020-12-13 — End: 2020-12-13

## 2020-12-13 MED ORDER — ONDANSETRON HCL 4 MG/2ML IJ SOLN: 4.0000 mg | Freq: Four times a day (QID) | INTRAMUSCULAR | Status: DC | PRN

## 2020-12-13 MED ORDER — INSULIN GLARGINE-YFGN 100 UNIT/ML ~~LOC~~ SOLN
10.0000 [IU] | Freq: Every day | SUBCUTANEOUS | Status: DC
  Administered 2020-12-13 – 2020-12-16 (×4): 10 [IU] via SUBCUTANEOUS
  Filled 2020-12-13 (×4): qty 0.1

## 2020-12-13 MED ORDER — TRAMADOL HCL 50 MG PO TABS
50.0000 mg | ORAL_TABLET | Freq: Four times a day (QID) | ORAL | Status: DC | PRN
  Administered 2020-12-13 (×2): 50 mg via ORAL
  Filled 2020-12-13 (×3): qty 1

## 2020-12-13 MED ORDER — GABAPENTIN 100 MG PO CAPS
100.0000 mg | ORAL_CAPSULE | Freq: Three times a day (TID) | ORAL | Status: DC
  Administered 2020-12-13 – 2020-12-16 (×9): 100 mg via ORAL
  Filled 2020-12-13 (×9): qty 1

## 2020-12-13 MED ORDER — TRAZODONE HCL 50 MG PO TABS: 25.0000 mg | ORAL_TABLET | Freq: Every evening | ORAL | Status: DC | PRN

## 2020-12-13 MED ORDER — INSULIN ASPART 100 UNIT/ML IJ SOLN
0.0000 [IU] | Freq: Three times a day (TID) | INTRAMUSCULAR | Status: DC
  Administered 2020-12-13: 1 [IU] via SUBCUTANEOUS
  Administered 2020-12-13: 2 [IU] via SUBCUTANEOUS
  Administered 2020-12-14 (×2): 1 [IU] via SUBCUTANEOUS
  Administered 2020-12-14: 2 [IU] via SUBCUTANEOUS
  Administered 2020-12-15 – 2020-12-16 (×3): 1 [IU] via SUBCUTANEOUS

## 2020-12-13 MED ORDER — BUSPIRONE HCL 5 MG PO TABS
5.0000 mg | ORAL_TABLET | Freq: Three times a day (TID) | ORAL | Status: DC
  Administered 2020-12-13 – 2020-12-16 (×10): 5 mg via ORAL
  Filled 2020-12-13 (×10): qty 1

## 2020-12-13 MED ORDER — ENOXAPARIN SODIUM 40 MG/0.4ML IJ SOSY
40.0000 mg | PREFILLED_SYRINGE | INTRAMUSCULAR | Status: DC
  Administered 2020-12-13 – 2020-12-15 (×3): 40 mg via SUBCUTANEOUS
  Filled 2020-12-13 (×3): qty 0.4

## 2020-12-13 MED ORDER — ACETAMINOPHEN 650 MG RE SUPP: 650.0000 mg | Freq: Four times a day (QID) | RECTAL | Status: DC | PRN

## 2020-12-13 MED ORDER — ACETAMINOPHEN 325 MG PO TABS: 650.0000 mg | ORAL_TABLET | Freq: Four times a day (QID) | ORAL | Status: DC | PRN

## 2020-12-13 MED ORDER — CARVEDILOL 6.25 MG PO TABS
6.2500 mg | ORAL_TABLET | Freq: Two times a day (BID) | ORAL | Status: DC
  Administered 2020-12-13 – 2020-12-16 (×6): 6.25 mg via ORAL
  Filled 2020-12-13 (×6): qty 1

## 2020-12-13 MED ORDER — LEVETIRACETAM 500 MG PO TABS: 500.0000 mg | ORAL_TABLET | Freq: Two times a day (BID) | ORAL | Status: DC

## 2020-12-13 MED ORDER — DICYCLOMINE HCL 20 MG PO TABS
20.0000 mg | ORAL_TABLET | Freq: Three times a day (TID) | ORAL | Status: DC | PRN
  Filled 2020-12-13: qty 1

## 2020-12-13 MED ORDER — CYCLOBENZAPRINE HCL 10 MG PO TABS
10.0000 mg | ORAL_TABLET | Freq: Three times a day (TID) | ORAL | Status: DC | PRN
  Administered 2020-12-13: 10 mg via ORAL
  Filled 2020-12-13: qty 1

## 2020-12-13 MED ORDER — NIRMATRELVIR/RITONAVIR (PAXLOVID) TABLET (RENAL DOSING)
2.0000 | ORAL_TABLET | Freq: Two times a day (BID) | ORAL | Status: AC
  Administered 2020-12-13 – 2020-12-14 (×3): 2 via ORAL

## 2020-12-13 MED ORDER — PROSOURCE PLUS PO LIQD
30.0000 mL | Freq: Two times a day (BID) | ORAL | Status: DC
  Administered 2020-12-13 – 2020-12-16 (×7): 30 mL via ORAL
  Filled 2020-12-13 (×9): qty 30

## 2020-12-13 MED ORDER — LEVETIRACETAM 500 MG PO TABS
500.0000 mg | ORAL_TABLET | Freq: Two times a day (BID) | ORAL | Status: DC
  Administered 2020-12-13 – 2020-12-16 (×7): 500 mg via ORAL
  Filled 2020-12-13 (×7): qty 1

## 2020-12-13 MED ORDER — PANTOPRAZOLE SODIUM 40 MG PO TBEC
40.0000 mg | DELAYED_RELEASE_TABLET | Freq: Every day | ORAL | Status: DC
  Administered 2020-12-13 – 2020-12-16 (×4): 40 mg via ORAL
  Filled 2020-12-13 (×4): qty 1

## 2020-12-13 MED ORDER — CARVEDILOL 12.5 MG PO TABS
12.5000 mg | ORAL_TABLET | Freq: Two times a day (BID) | ORAL | Status: DC
  Administered 2020-12-13: 12.5 mg via ORAL
  Filled 2020-12-13: qty 1

## 2020-12-13 MED ORDER — FOLIC ACID 1 MG PO TABS
1.0000 mg | ORAL_TABLET | Freq: Every day | ORAL | Status: DC
  Administered 2020-12-13 – 2020-12-16 (×4): 1 mg via ORAL
  Filled 2020-12-13 (×4): qty 1

## 2020-12-13 MED ORDER — POLYVINYL ALCOHOL 1.4 % OP SOLN: 1.0000 [drp] | Freq: Every day | OPHTHALMIC | Status: DC | PRN

## 2020-12-13 NOTE — Plan of Care (Signed)
  Problem: Education: Goal: Knowledge of General Education information will improve Description: Including pain rating scale, medication(s)/side effects and non-pharmacologic comfort measures Outcome: Progressing   Problem: Activity: Goal: Risk for activity intolerance will decrease Outcome: Progressing   Problem: Elimination: Goal: Will not experience complications related to bowel motility Outcome: Progressing   

## 2020-12-13 NOTE — ED Notes (Signed)
Phlebotomist notified to draw labs as pt IV won't pull back. Phlebotomist is coming.

## 2020-12-13 NOTE — ED Notes (Signed)
Pt husband brought in pt home medication, Paxlovid. Pt took 2 tablets of home meds at this time scheduled for this AM.

## 2020-12-13 NOTE — Progress Notes (Signed)
Ordering 1u PRBC transfusion for HGB 6.8.

## 2020-12-13 NOTE — H&P (Signed)
History and Physical    [name] NKN:[id number] DOB: 22-Mar-1991 DOA: 12/12/2020  PCP: [name], MD  Patient coming from: Home  I have personally briefly reviewed patient's old medical records in [place]  Chief Complaint: AMS, hypoxia  HPI: [name] is a 30 y.o. female with medical history significant of DM1, gastroparesis, CKD 3b, HTN, COVID-19 (multiple times).  Pt was admitted to hospital on 9/5 with AMS due to PRES from HTN emergency.  BP controlled, started on kepra for GTC on presentation.  Noted to have AKI after admission (suspected to be due to over control of BP initially), noted to be COVID+ (pt was essentially asymptomatic) started on Paxlovid.  D.Dimer elevated at >20, started on empiric heparin gtt, VQ scan on 9/12 was negative for PE.  Pt discharged home on 9/12.  This evening pt found unresponsive by husband who called EMS and started CPR.  Per EMS pt found to have pulses on arrival but O2 sat 76%, improved with oxygen.  No fevers, CP, N/V.   ED Course: Initially on NRB, pt has has rapidly decreasing O2 requirements (now satting 97% on just 2L).  Creat 2.5 and BUN 61 up from 1.93 and 42 on discharge.  CXR shows pulmonary vasc congestion and mild cardiomegaly.  BNP 1000.  BP on soft side (~379 systolic).  Albumin 2.0 (was running in the 1 range during last admit, significantly down from 5.1 last month).  CRP 8.4.  Procalcitonin 0.73.  HGB 7.2 (stable from last admit).   Review of Systems: As per HPI, otherwise all review of systems negative.  Past Medical History:  Diagnosis Date   Allergy    Anemia    Anxiety    Blood transfusion without reported diagnosis    Dec 2018   Cataract    right eye   COVID-19 03/2020   Depression    Diabetes type 1, uncontrolled ([place]) 11/14/2011   Since age 72   Fibromyalgia    Gastroparesis    GERD (gastroesophageal reflux disease)    Hypertension    Infection    UTI April 2016     Past Surgical History:  Procedure Laterality Date   AMPUTATION Right 04/13/2020   Procedure: 1ST RAY AMPUTATION RIGHT FOOT;  Surgeon: Newt Minion, MD;  Location: [facility];  Service: Orthopedics;  Laterality: Right;   AMPUTATION Right 06/06/2020   Procedure: RIGHT BELOW KNEE AMPUTATION;  Surgeon: Newt Minion, MD;  Location: [facility];  Service: Orthopedics;  Laterality: Right;   AMPUTATION Left    great toe   ANKLE SURGERY     APPLICATION OF WOUND VAC Right 06/06/2020   Procedure: APPLICATION OF WOUND VAC;  Surgeon: Newt Minion, MD;  Location: [facility];  Service: Orthopedics;  Laterality: Right;   CHOLECYSTECTOMY  11/15/2011   Procedure: LAPAROSCOPIC CHOLECYSTECTOMY WITH INTRAOPERATIVE CHOLANGIOGRAM;  Surgeon: [name], MD;  Location: [facility];  Service: General;  Laterality: N/A;   COLONOSCOPY     COLONOSCOPY WITH PROPOFOL N/A 06/27/2017   Procedure: COLONOSCOPY WITH PROPOFOL;  Surgeon: Milus Banister, MD;  [facility]: [facility];  Service: Endoscopy;  Laterality: N/A;   ESOPHAGOGASTRODUODENOSCOPY  12/03/2011   Procedure: ESOPHAGOGASTRODUODENOSCOPY (EGD);  Surgeon: Beryle Beams, MD;  Location: [facility];  Service: Endoscopy;  Laterality: N/A;   FLEXIBLE SIGMOIDOSCOPY N/A 03/10/2017   Procedure: FLEXIBLE SIGMOIDOSCOPY;  Surgeon: Carol Ada, MD;  [facility];  Service: Endoscopy;  Laterality: N/A;   INCISION AND DRAINAGE  PERIRECTAL ABSCESS N/A 03/01/2017   Procedure: IRRIGATION AND DEBRIDEMENT PERIRECTAL ABSCESS;  Surgeon: Alphonsa Overall, MD;  [facility];  Service: General;  Laterality: N/A;   IRRIGATION AND DEBRIDEMENT BUTTOCKS N/A 03/23/2017   Procedure: IRRIGATION AND DEBRIDEMENT BUTTOCKS, SETON PLACEMENT;  Surgeon: [name], MD;  [facility];  Service: General;  Laterality: N/A;   LAPAROSCOPY  11/23/2011   Procedure: LAPAROSCOPY DIAGNOSTIC;  Surgeon: Edward Jolly, MD;  [facility]: [facility];  Service: General;  Laterality: N/A;    SIGMOIDOSCOPY     UPPER GASTROINTESTINAL ENDOSCOPY     WISDOM TOOTH EXTRACTION       reports that she has never smoked. She has never used smokeless tobacco. She reports that she does not currently use drugs after having used the following drugs: Marijuana. She reports that she does not drink alcohol.  Allergies  Allergen Reactions   Other Anaphylaxis    Reaction to [place] nuts    Lactose Intolerance (Gi) Diarrhea    Family History  Problem Relation Age of Onset   Diabetes Mother    Hypertension Father    Colon cancer Paternal Grandmother        pt thinks PGM was dx in her 65's   Diabetes Paternal Grandmother    Diabetes Maternal Grandmother    Diabetes Maternal Grandfather    Diabetes Paternal Grandfather    Diabetes Other    Breast cancer Paternal Aunt    Esophageal cancer Neg Hx    Liver cancer Neg Hx    Pancreatic cancer Neg Hx    Stomach cancer Neg Hx    Rectal cancer Neg Hx      Prior to Admission medications   Medication Sig Start Date End Date Taking? Authorizing Provider  Albuterol Sulfate (PROAIR RESPICLICK) 124 (90 Base) MCG/ACT AEPB Inhale 2 puffs into the lungs daily as needed. Patient taking differently: Inhale 2 puffs into the lungs daily as needed (wheezing). 11/08/20 12/08/20  Allwardt, Randa Evens, PA-C  amLODipine (NORVASC) 10 MG tablet Take 1 tablet (10 mg total) by mouth daily. 11/08/20   Allwardt, Randa Evens, PA-C  ascorbic acid (VITAMIN C) 500 MG tablet Take 1 tablet (500 mg total) by mouth daily. 12/10/20   Sheikh, Georgina Quint Latif, DO  busPIRone (BUSPAR) 5 MG tablet Take 1 tablet (5 mg total) by mouth 3 (three) times daily. 02/03/20   [name], MD  Carboxymethylcell-Hypromellose (GENTEAL) 0.25-0.3 % GEL Place 1 drop into both eyes daily as needed (dryeyes).    [provider]  carvedilol (COREG) 12.5 MG tablet Take 1 tablet (12.5 mg total) by mouth 2 (two) times daily with a meal. 04/16/20 02/02/21  Dahal, Marlowe Aschoff, MD  Continuous Blood Gluc Sensor  (DEXCOM G6 SENSOR) MISC USE AS DIRECTED EVERY 10 DAYS 07/28/20   Renato Shin, MD  Continuous Blood Gluc Transmit (DEXCOM G6 TRANSMITTER) MISC USE AS DIRECTED TO  MONITOR  GLUCOSE  LEVELS 05/10/20   Renato Shin, MD  cyclobenzaprine (FLEXERIL) 10 MG tablet Take 1 tablet (10 mg total) by mouth 3 (three) times daily as needed for muscle spasms. 11/15/20   [name], MD  dicyclomine (BENTYL) 20 MG tablet Take 1 tablet (20 mg total) by mouth every 8 (eight) hours as needed for spasms. 11/25/19   Petrucelli, Samantha R, PA-C  EPINEPHrine 0.3 mg/0.3 mL IJ SOAJ injection Inject 0.3 mg into the muscle once as needed (anaphylaxis/allergic reaction). Patient taking differently: Inject 0.3 mg into the muscle once as needed for anaphylaxis. 07/16/20  [name], MD  gabapentin (NEURONTIN) 100 MG capsule Take 200 mg by mouth 3 (three) times daily. 11/02/20   [provider]  guaiFENesin (MUCINEX) 600 MG 12 hr tablet Take 1 tablet (600 mg total) by mouth 2 (two) times daily for 5 days. 12/10/20 12/15/20  Raiford Noble Latif, DO  hydrALAZINE (APRESOLINE) 50 MG tablet Take 100 mg by mouth 2 (two) times daily. 09/14/20   [provider]  insulin glargine (LANTUS) 100 UNIT/ML injection Inject 0.05 mLs (5 Units total) into the skin daily. 12/10/20   Raiford Noble Latif, DO  insulin lispro (HUMALOG) 100 UNIT/ML injection Inject 0-5 Units into the skin 3 (three) times daily before meals. Sliding scale    [provider]  Insulin Pen Needle 32G X 4 MM MISC USE AS DIRECTED WITH INSULIN PENS 05/24/20 05/24/21  Mercy Riding, MD  Ipratropium-Albuterol (COMBIVENT) 20-100 MCG/ACT AERS respimat Inhale 1 puff into the lungs every 6 (six) hours as needed for wheezing or shortness of breath. 12/10/20   Raiford Noble Latif, DO  levETIRAcetam (KEPPRA) 500 MG tablet Take 1 tablet (500 mg total) by mouth 2 (two) times daily. 12/10/20   Raiford Noble Latif, DO  loperamide (IMODIUM) 2 MG capsule Take 6 mg by  mouth daily as needed for diarrhea or loose stools.     [provider]  LORazepam (ATIVAN) 0.5 MG tablet Take 1 tablet (0.5 mg total) by mouth 2 (two) times daily. 11/15/20   [name], MD  metoCLOPramide (REGLAN) 5 MG tablet Take 1 tablet (5 mg total) by mouth 3 (three) times daily before meals. Please make and keep an appointment for further refills. 11/13/20 02/11/21  [place] Eric J, DO  nirmatrelvir/ritonavir EUA, renal dosing, (PAXLOVID) 10 x 150 MG & 10 x 100MG TABS Take 2 tablets by mouth 2 (two) times daily for 5 days. Patient GFR is 35. Take nirmatrelvir (150 mg) one tablet twice daily for 5 days and ritonavir (100 mg) one tablet twice daily for 5 days. 12/10/20 12/15/20  Raiford Noble Latif, DO  Nutritional Supplements (,FEEDING SUPPLEMENT, PROSOURCE PLUS) liquid Take 30 mLs by mouth 2 (two) times daily between meals. 12/10/20   Raiford Noble Latif, DO  ondansetron (ZOFRAN ODT) 4 MG disintegrating tablet Take 1 tablet (4 mg total) by mouth every 8 (eight) hours as needed for nausea or vomiting. 11/08/20   Allwardt, Randa Evens, PA-C  oxyCODONE-acetaminophen (PERCOCET) 10-325 MG tablet Take 1 tablet by mouth every 4 (four) hours as needed for pain. 12/10/20   [name], MD  traZODone (DESYREL) 50 MG tablet Take 0.5-1 tablets (25-50 mg total) by mouth at bedtime as needed for sleep. 11/15/20   [name], MD  zinc sulfate 220 (50 Zn) MG capsule Take 1 capsule (220 mg total) by mouth daily. 12/10/20   Raiford Noble Latif, DO  potassium chloride SA (KLOR-CON) 20 MEQ tablet Take 1 tablet (20 mEq total) by mouth daily for 5 doses. 07/04/19 07/30/19  Janeece Fitting, PA-C    Physical Exam: Vitals:   12/13/20 0200 12/13/20 0215 12/13/20 0230 12/13/20 0243  BP: 105/61 100/63 100/65 97/60  Pulse: 71 70 69 69  Resp: 11 13 13  (!) 9  Temp:      TempSrc:      SpO2: 95% 97% 98% 95%  Weight:      Height:        Constitutional: Sleepy, wakes up to voice Eyes: PERRL, lids and conjunctivae  normal ENMT: Mucous membranes  are moist. Posterior pharynx clear of any exudate or lesions.Normal dentition.  Neck: normal, supple, no masses, no thyromegaly Respiratory: clear to auscultation bilaterally, no wheezing, no crackles. Normal respiratory effort. No accessory muscle use.  Cardiovascular: Regular rate and rhythm, no murmurs / rubs / gallops. No extremity edema.  Abdomen: no tenderness, no masses palpated. No hepatosplenomegaly. Bowel sounds positive.  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 No induration Neurologic: CN 2-12 grossly intact. Sensation intact, DTR normal. Strength 5/5 in all 4.  Psychiatric: Normal judgment and insight. Alert and oriented x 3. Normal mood.    Labs on Admission: I have personally reviewed following labs and imaging studies  CBC: Recent Labs  Lab 12/07/20 1003 12/08/20 0227 12/09/20 0905 12/10/20 0435 12/12/20 2235  WBC 6.5 8.0 4.9 5.5 8.1  NEUTROABS 4.3 5.7 2.8 3.0 5.9  HGB 7.9* 7.6* 7.9* 7.0* 7.2*  HCT 24.8* 23.9* 24.9* 22.1* 23.0*  MCV 91.9 91.9 94.0 93.6 97.0  PLT 171 177 215 189 588   Basic Metabolic Panel: Recent Labs  Lab 12/07/20 0520 12/08/20 0227 12/09/20 0905 12/10/20 0435 12/12/20 2235  NA 136 133* 135 131* 134*  K 4.1 4.2 4.3 4.6 5.6*  CL 106 107 108 106 109  CO2 21* 22 22 20* 16*  GLUCOSE 163* 233* 307* 377* 217*  BUN 24* 28* 35* 42* 61*  CREATININE 1.66* 1.64* 1.77* 1.93* 2.50*  CALCIUM 8.4* 7.9* 8.2* 7.9* 8.5*  MG 1.8 1.6* 2.2 2.2  --   PHOS  --  4.9* 4.2 4.9*  --    GFR: Estimated Creatinine Clearance: 31.7 mL/min (A) (by C-G formula based on SCr of 2.5 mg/dL (H)). Liver Function Tests: Recent Labs  Lab 12/08/20 0227 12/09/20 0905 12/10/20 0435 12/12/20 2235  AST 22 27 34 134*  ALT 10 16 22  62*  ALKPHOS 94 92 96 247*  BILITOT 0.5 0.5 0.3 0.5  PROT 5.5* 5.9* 5.5* 6.1*  ALBUMIN 1.7* 1.8* 1.7* 2.0*   No results for input(s): LIPASE, AMYLASE in the  last 168 hours. No results for input(s): AMMONIA in the last 168 hours. Coagulation Profile: No results for input(s): INR, PROTIME in the last 168 hours. Cardiac Enzymes: No results for input(s): CKTOTAL, CKMB, CKMBINDEX, TROPONINI in the last 168 hours. BNP (last 3 results) No results for input(s): PROBNP in the last 8760 hours. HbA1C: No results for input(s): HGBA1C in the last 72 hours. CBG: Recent Labs  Lab 12/09/20 1625 12/09/20 2108 12/10/20 0805 12/10/20 1151 12/10/20 1642  GLUCAP 116* 168* 298* 144* 156*   Lipid Profile: Recent Labs    12/12/20 2309  TRIG 143   Thyroid Function Tests: No results for input(s): TSH, T4TOTAL, FREET4, T3FREE, THYROIDAB in the last 72 hours. Anemia Panel: Recent Labs    12/10/20 0435 12/12/20 2309  FERRITIN 12 673*   Urine analysis:    Component Value Date/Time   COLORURINE YELLOW (A) 11/29/2020 1244   APPEARANCEUR CLEAR (A) 11/29/2020 1244   LABSPEC 1.025 11/29/2020 1244   PHURINE 6.0 11/29/2020 1244   GLUCOSEU 500 (A) 11/29/2020 1244   HGBUR LARGE (A) 11/29/2020 1244   BILIRUBINUR NEGATIVE 11/29/2020 1244   BILIRUBINUR negative 01/02/2015 1717   KETONESUR TRACE (A) 11/29/2020 1244   PROTEINUR >300 (A) 11/29/2020 1244   UROBILINOGEN 0.2 01/13/2015 0223   NITRITE NEGATIVE 11/29/2020 1244   LEUKOCYTESUR NEGATIVE 11/29/2020 1244    Radiological Exams on Admission: DG Chest Portable 1 View  Result  Date: 12/12/2020 CLINICAL DATA:  Found unresponsive. EXAM: PORTABLE CHEST 1 VIEW COMPARISON:  December 10, 2020 FINDINGS: The right-sided PICC line seen on the prior study has been removed. Mild to moderate severity diffusely increased interstitial lung markings are seen with prominence of the perihilar pulmonary vasculature. Mild areas of atelectasis and/or infiltrate are also noted within the bilateral infrahilar regions. There is no evidence of a pleural effusion or pneumothorax. The cardiac silhouette is mildly increased in size.  The visualized skeletal structures are unremarkable. IMPRESSION: Mild to moderate severity pulmonary vascular congestion with mild bilateral infrahilar atelectasis and/or infiltrate. Electronically Signed   By: Virgina Norfolk M.D.   On: 12/12/2020 23:11    EKG: Independently reviewed.  Assessment/Plan Principal Problem:   Altered mental status Active Problems:   Hypertension associated with diabetes (HCC)   Chronic anemia   AKI (acute kidney injury) ([place])   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 ([place])   COVID-19 virus infection   Type 1 diabetes mellitus with other specified complication (HCC)   Acute respiratory failure with hypoxia (HCC)   Chronic diastolic CHF (congestive heart failure) ([place])    AMS - DDx includes seizure, percocet OD, hypercapnic resp failure. Cont keppra Hold percocet Check ABG Chronic diastolic CHF - Grade 2 DD on 2d echo earlier this year No peripheral edema Not clear that shes having acute exacerbation today Has AKI with pre-renal picture on todays labs Holding off on lasix for the moment Tele monitor Hypoalbuminemia  Albumin noted to be low - >300 protein in urine last admit UA today pending Wondering if pt has undiagnosed nephrotic syndrome With low BP on presentation: will give 25g IV albumin here in ED and see how she responds. 24h urine protein Though if truly nephrotic syndrome, suspect the IV albumin wont help much. Chronic anemia - Stable from last admit HTN - BP on soft side See how she responds to albumin Cont coreg Hold amlodipine Hold hydralazine AKI on CKD 3b- Possibly due to soft BPs BP meds as above Albumin as above Check UA DM1 - Cont lantus 5u daily Cont very sensitive SSI AC  DVT prophylaxis: Lovenox Code Status: Full Family Communication: No family in room Disposition Plan: Home after admit Consults called: None Admission status: Place in 56    Giabella Duhart, [place] Hospitalists  How to  contact the [facility] Attending or Consulting provider [place] or covering provider during after hours [place], for this patient?  Check the care team in [facility] and look for a) attending/consulting [facility] provider listed and b) the [facility] team listed Log into [email or web address]  Amion Physician Scheduling and messaging for groups and whole hospitals  On call and physician scheduling software for group practices, residents, hospitalists and other medical providers for call, clinic, rotation and shift schedules. On[employer] is a hospital-wide system for scheduling doctors and paging doctors on call. EasyPlot is for scientific plotting and data analysis.  [email or web address]  and use 's universal password to access. If you do not have the password, please contact the hospital operator.  Locate the [facility] provider you are looking for under [facility] and page to a number that you can be directly reached. If you still have difficulty reaching the provider, please page the [facility] (Director on Call) for the Hospitalists listed on amion for assistance.  12/13/2020, 3:06 AM

## 2020-12-13 NOTE — ED Provider Notes (Signed)
I was asked to evaluate patient Pt with extensive history, recently admitted and had COVID Now with hypoxia prior to arrival and confusion CXR reveals edema and cardiomegaly She also has mild hyperkalemia Pt is resting comfortably and easily arousable and vitals appropriate Pt will require admission for ongoing evaluation/management   Ripley Fraise, MD 12/13/20 [phone]

## 2020-12-13 NOTE — Progress Notes (Addendum)
I agree with the following treatment note after review of the documentation. This session was performed under the supervision of a licensed clinician.   Lou Miner, DPT  Acute Rehabilitation Services  Pager: [phone]    PT Cancellation Note  Patient Details Name: [name] MRN: [id number] DOB: 1990/07/16   Cancelled Treatment:    Reason Eval/Treat Not Completed: Medical issues which prohibited therapy. Pt's hemoglobin 6.8 and awaiting blood transfusion. We will follow up as medically appropriate and schedule allows.  Dawayne Cirri, SPT Dawayne Cirri 12/13/2020, 8:23 AM

## 2020-12-13 NOTE — ED Notes (Signed)
Breakfast Ordered 

## 2020-12-13 NOTE — Progress Notes (Signed)
Pt admitted from ED this evening alert & oriented moaning and c/o pain to back & foot and stated that the Tramadol that she received this am did not help her pain at all. Dr. Candiss Norse was made aware & stated that he was not going to order anything else for pain.

## 2020-12-13 NOTE — Progress Notes (Signed)
  Echocardiogram 2D Echocardiogram has been performed.  [name] 12/13/2020, 2:46 PM

## 2020-12-13 NOTE — ED Notes (Signed)
Pt found crying at this time. Pt states she has hx of fibromyalgia and takes Oxycodone q4hrs prn at home. Reports 10/10 generalized pain. MD states no narcotics at this time as pt came in unresponsive and OD. Pt agrees to take Tramadol but not tylenol as she states she has kidney issues. Will continue to monitor.

## 2020-12-13 NOTE — ED Notes (Signed)
I wasn't able to get patient blood, the Nurse was informed.

## 2020-12-13 NOTE — Progress Notes (Addendum)
PROGRESS NOTE                                                                                                                                                                                                             Patient Demographics:    [name], is a 30 y.o. female, DOB - January 14, 1991, XJO:[id number]  Outpatient Primary MD for the patient is [name], MD    LOS - 0  Admit date - 12/12/2020    Chief Complaint  Patient presents with   Unresponsive       Brief Narrative (HPI from H&P)  [name] is a 30 y.o. female with medical history significant of DM1, gastroparesis, CKD 3b, HTN, COVID-19 (multiple times).   Pt was admitted to hospital on 9/5 with AMS due to PRES from HTN emergency.  BP controlled, started on kepra for GTC on presentation, she was noted to have AKI after admission (suspected to be due to over control of BP initially), noted to be COVID+ (pt was essentially asymptomatic) started on Paxlovid.  D.Dimer was elevated at >20, started on empiric heparin gtt, VQ scan on 9/12 was negative for PE.  Pt was discharged home on 12/10/20.   On 12/12/20 she was found unresponsive by husband who called EMS and started CPR.  Per EMS pt found to have pulses on arrival but O2 sat 76%, improved with oxygen, she was brought to the ER with AMS, dehydration, AKI, Hypotension, Anaemia and suspicion for Accidental Percocet OD.   Subjective:    [name] today has, No headache, No chest pain, No abdominal pain - No Nausea, No new weakness tingling or numbness, no SOB.   Assessment  & Plan :     AMS with Acute Hypoxic Resp failure due to decreased Resp drive - most likely due to accidental narcotic overdose, with supportive care much improved, mental status is close to baseline, currently on room air and in no distress, counseled not to use narcotics in excess.  Avoiding further strong narcotic use.  She has no  focal deficits or headache, supple neck, continue to monitor closely.  2.  Recent COVID-19 infection.  Diagnosed on 12/03/2020.  Stable comes off of isolation on 12/14/2020.  3.  Dehydration, AKI.  Baseline CKD stage IIIa.  Hydrate and monitor baseline creatinine close to 1.7  4.  Anemia of chronic disease.  Due to underlying AKI will transfuse 1 unit of packed RBC on 12/13/2020.  No signs of ongoing brisk bleeding.  Continue PPI with outpatient age-appropriate work-up.  5.  Hyperkalemia. Lokelma and monitor.  6.  History of seizures.  On Keppra continue.  7.  Iron deficiency and vitamin B12 deficiency.  Placed on replacement.  8.  Hypertension.  Currently on Coreg.  Will drop this dose as blood pressure is on the softer side and monitor.  9.  Diabetic gastroparesis.  Reglan Premeal.  10. Chr. Diastolic CHF Last EF 63% - repeat TTE.  11.  DM 2.  On Lantus and sliding scale will monitor and adjust.  Lab Results  Component Value Date   HGBA1C 7.2 (H) 11/13/2020   CBG (last 3)  Recent Labs    12/10/20 1642 12/13/20 0817 12/13/20 1148  GLUCAP 156* 204* 177*         Condition - Extremely Guarded  Family Communication  :  None present  Code Status :  Full  Consults  :  None  PUD Prophylaxis : PPI   Procedures  :     TTE       Disposition Plan  :    Status is: Observation  Dispo: The patient is from: Home              Anticipated d/c is to: Home              Patient currently is not medically stable to d/c.   Difficult to place patient No   DVT Prophylaxis  :     enoxaparin (LOVENOX) injection 40 mg Start: 12/13/20 1400    Lab Results  Component Value Date   PLT 211 12/13/2020    Diet :  Diet Order             Diet Carb Modified Fluid consistency: Thin; Room service appropriate? Yes  Diet effective now                    Inpatient Medications  Scheduled Meds:  (feeding supplement) PROSource Plus  30 mL Oral BID BM   sodium chloride    Intravenous Once   sodium chloride   Intravenous Once   ascorbic acid  500 mg Oral Daily   busPIRone  5 mg Oral TID   carvedilol  12.5 mg Oral BID WC   enoxaparin (LOVENOX) injection  40 mg Subcutaneous Q24H   ferrous sulfate  325 mg Oral TID WC   folic acid  1 mg Oral Daily   gabapentin  200 mg Oral TID   insulin aspart  0-6 Units Subcutaneous TID WC   insulin glargine-yfgn  10 Units Subcutaneous Daily   levETIRAcetam  500 mg Oral BID   LORazepam  0.5 mg Oral BID   metoCLOPramide  5 mg Oral TID AC   pantoprazole  40 mg Oral Daily   sodium zirconium cyclosilicate  10 g Oral BID   vitamin B-12  1,000 mcg Oral Daily   zinc sulfate  220 mg Oral Daily   Continuous Infusions: PRN Meds:.acetaminophen, albuterol, cyclobenzaprine, dicyclomine, Ipratropium-Albuterol, [DISCONTINUED] ondansetron **OR** ondansetron (ZOFRAN) IV, polyvinyl alcohol, traMADol, traZODone  Antibiotics  :    Anti-infectives (From admission, onward)    Start     Dose/Rate Route Frequency Ordered Stop   12/13/20 0245  nirmatrelvir/ritonavir EUA (renal dosing) (PAXLOVID) 2 tablet  Status:  Discontinued       Note  to Pharmacy: Patient GFR is 35. Take nirmatrelvir (150 mg) one tablet twice daily for 5 days and ritonavir (100 mg) one tablet twice daily for 5 days.     2 tablet Oral 2 times daily 12/13/20 0244 12/13/20 1334        Time Spent in minutes  30   Lala Lund M.D on 12/13/2020 at 1:34 PM  To page go to [email or web address]   [facility] -  Office  [phone]    See all Orders from today for further details    Objective:   Vitals:   12/13/20 1030 12/13/20 1100 12/13/20 1230 12/13/20 1300  BP: 106/65 94/65 120/80 110/76  Pulse: 73 72 71 69  Resp: 17 15 12 11   Temp:   97.9 F (36.6 C)   TempSrc:   Oral   SpO2: 100% 100% 100% 99%  Weight:      Height:        Wt Readings from Last 3 Encounters:  12/12/20 72.6 kg  12/07/20 75.2 kg  11/19/20 72.6 kg     Intake/Output Summary (Last  24 hours) at 12/13/2020 1334 Last data filed at 12/13/2020 1057 Gross per 24 hour  Intake 1100 ml  Output --  Net 1100 ml     Physical Exam  Awake Alert, No new F.N deficits, Normal affect [place].AT,PERRAL Supple Neck,No JVD, No cervical lymphadenopathy appriciated.  Symmetrical Chest wall movement, Good air movement bilaterally, CTAB RRR,No Gallops,Rubs or new Murmurs, No Parasternal Heave +ve B.Sounds, Abd Soft, No tenderness, No organomegaly appriciated, No rebound - guarding or rigidity. No Cyanosis, Clubbing or edema, No new Rash or bruise      Data Review:    CBC Recent Labs  Lab 12/07/20 1003 12/08/20 0227 12/09/20 0905 12/10/20 0435 12/12/20 2235 12/13/20 0333 12/13/20 0500  WBC 6.5 8.0 4.9 5.5 8.1  --  6.9  HGB 7.9* 7.6* 7.9* 7.0* 7.2* 7.5* 6.8*  HCT 24.8* 23.9* 24.9* 22.1* 23.0* 22.0* 23.1*  PLT 171 177 215 189 211  --  211  MCV 91.9 91.9 94.0 93.6 97.0  --  99.6  MCH 29.3 29.2 29.8 29.7 30.4  --  29.3  MCHC 31.9 31.8 31.7 31.7 31.3  --  29.4*  RDW 14.0 14.1 13.8 14.1 13.9  --  13.9  LYMPHSABS 1.2 1.1 1.2 1.5 1.2  --   --   MONOABS 0.5 0.6 0.5 0.6 0.8  --   --   EOSABS 0.5 0.5 0.3 0.4 0.2  --   --   BASOSABS 0.1 0.1 0.0 0.1 0.1  --   --     Recent Labs  Lab 12/07/20 0520 12/08/20 0227 12/09/20 0905 12/10/20 0435 12/12/20 2235 12/12/20 2255 12/12/20 2309 12/13/20 0102 12/13/20 0333 12/13/20 0500  NA 136 133* 135 131* 134*  --   --   --  135 133*  K 4.1 4.2 4.3 4.6 5.6*  --   --   --  5.4* 5.4*  CL 106 107 108 106 109  --   --   --   --  110  CO2 21* 22 22 20* 16*  --   --   --   --  16*  GLUCOSE 163* 233* 307* 377* 217*  --   --   --   --  212*  BUN 24* 28* 35* 42* 61*  --   --   --   --  62*  CREATININE 1.66* 1.64* 1.77* 1.93* 2.50*  --   --   --   --  2.48*  CALCIUM 8.4* 7.9* 8.2* 7.9* 8.5*  --   --   --   --  8.4*  AST  --  22 27 34 134*  --   --   --   --   --   ALT  --  10 16 22  62*  --   --   --   --   --   ALKPHOS  --  94 92 96 247*  --    --   --   --   --   BILITOT  --  0.5 0.5 0.3 0.5  --   --   --   --   --   ALBUMIN  --  1.7* 1.8* 1.7* 2.0*  --   --   --   --   --   MG 1.8 1.6* 2.2 2.2  --   --   --   --   --   --   CRP  --  0.6 <0.5 0.6  --   --  8.4*  --   --   --   DDIMER  --  2.44* >20.00* 1.74*  --   --  4.42*  --   --   --   PROCALCITON  --   --   --   --   --   --  0.73  --   --   --   LATICACIDVEN  --   --   --   --   --  1.2  --  0.9  --   --   BNP  --   --   --   --  1,104.9*  --   --   --   --   --     ------------------------------------------------------------------------------------------------------------------ Recent Labs    12/12/20 2309  TRIG 143    Lab Results  Component Value Date   HGBA1C 7.2 (H) 11/13/2020   ------------------------------------------------------------------------------------------------------------------ No results for input(s): TSH, T4TOTAL, T3FREE, THYROIDAB in the last 72 hours.  Invalid input(s): FREET3  Cardiac Enzymes No results for input(s): CKMB, TROPONINI, MYOGLOBIN in the last 168 hours.  Invalid input(s): CK ------------------------------------------------------------------------------------------------------------------    Component Value Date/Time   BNP 1,104.9 (H) 12/12/2020 2235    Micro Results Recent Results (from the past 240 hour(s))  MRSA Next Gen by PCR, Nasal     Status: None   Collection Time: 12/03/20  1:56 PM   Specimen: Nasal Mucosa; Nasal Swab  Result Value Ref Range Status   MRSA by PCR Next Gen NOT DETECTED NOT DETECTED Final    Comment: (NOTE) The GeneXpert MRSA Assay (FDA approved for NASAL specimens only), is one component of a comprehensive MRSA colonization surveillance program. It is not intended to diagnose MRSA infection nor to guide or monitor treatment for MRSA infections. Test performance is not FDA approved in patients less than 85 years old. Performed at [facility], [place] [address], [place],  [place] [address]   SARS CORONAVIRUS 2 (TAT 6-24 HRS) Nasopharyngeal Nasopharyngeal Swab     Status: Abnormal   Collection Time: 12/04/20  3:03 PM   Specimen: Nasopharyngeal Swab  Result Value Ref Range Status   SARS Coronavirus 2 POSITIVE (A) NEGATIVE Final    Comment: (NOTE) SARS-CoV-2 target nucleic acids are DETECTED.  The SARS-CoV-2 RNA is generally detectable in upper and lower respiratory specimens during the acute phase of infection. Positive results are indicative of the presence of SARS-CoV-2 RNA. Clinical correlation with patient  history and other diagnostic information is  necessary to determine patient infection status. Positive results do not rule out bacterial infection or co-infection with other viruses.  The expected result is Negative.  Fact Sheet for Patients: SugarRoll.be  Fact Sheet for Healthcare Providers: https://www.woods-mathews.com/  This test is not yet approved or cleared by the [place] FDA and  has been authorized for detection and/or diagnosis of SARS-CoV-2 by FDA under an Emergency Use Authorization (EUA). This EUA will remain  in effect (meaning this test can be used) for the duration of the COVID-19 declaration under Section 564(b)(1) of the Act, 21 U. S.C. section 360bbb-3(b)(1), unless the authorization is terminated or revoked sooner.   Performed at [facility], [place] [address] [place], [place] [address]

## 2020-12-14 DIAGNOSIS — R400 Somnolence: Secondary | ICD-10-CM | POA: Diagnosis not present

## 2020-12-14 DIAGNOSIS — I878 Other specified disorders of veins: Secondary | ICD-10-CM

## 2020-12-14 DIAGNOSIS — J9601 Acute respiratory failure with hypoxia: Secondary | ICD-10-CM | POA: Diagnosis not present

## 2020-12-14 DIAGNOSIS — L97521 Non-pressure chronic ulcer of other part of left foot limited to breakdown of skin: Secondary | ICD-10-CM

## 2020-12-14 DIAGNOSIS — Z89511 Acquired absence of right leg below knee: Secondary | ICD-10-CM

## 2020-12-14 DIAGNOSIS — E43 Unspecified severe protein-calorie malnutrition: Secondary | ICD-10-CM

## 2020-12-14 DIAGNOSIS — I739 Peripheral vascular disease, unspecified: Secondary | ICD-10-CM

## 2020-12-14 DIAGNOSIS — N183 Chronic kidney disease, stage 3 unspecified: Secondary | ICD-10-CM | POA: Diagnosis not present

## 2020-12-14 DIAGNOSIS — E1022 Type 1 diabetes mellitus with diabetic chronic kidney disease: Secondary | ICD-10-CM | POA: Diagnosis not present

## 2020-12-14 DIAGNOSIS — I5032 Chronic diastolic (congestive) heart failure: Secondary | ICD-10-CM | POA: Diagnosis not present

## 2020-12-14 DIAGNOSIS — N179 Acute kidney failure, unspecified: Secondary | ICD-10-CM | POA: Diagnosis not present

## 2020-12-14 LAB — CBC WITH DIFFERENTIAL/PLATELET
Abs Immature Granulocytes: 0.04 10*3/uL (ref 0.00–0.07)
Basophils Absolute: 0.1 10*3/uL (ref 0.0–0.1)
Basophils Relative: 1 %
Eosinophils Absolute: 0.4 10*3/uL (ref 0.0–0.5)
Eosinophils Relative: 5 %
HCT: 26.9 % — ABNORMAL LOW (ref 36.0–46.0)
Hemoglobin: 8.7 g/dL — ABNORMAL LOW (ref 12.0–15.0)
Immature Granulocytes: 1 %
Lymphocytes Relative: 15 %
Lymphs Abs: 1.1 10*3/uL (ref 0.7–4.0)
MCH: 29.7 pg (ref 26.0–34.0)
MCHC: 32.3 g/dL (ref 30.0–36.0)
MCV: 91.8 fL (ref 80.0–100.0)
Monocytes Absolute: 0.7 10*3/uL (ref 0.1–1.0)
Monocytes Relative: 10 %
Neutro Abs: 4.9 10*3/uL (ref 1.7–7.7)
Neutrophils Relative %: 68 %
Platelets: 254 10*3/uL (ref 150–400)
RBC: 2.93 MIL/uL — ABNORMAL LOW (ref 3.87–5.11)
RDW: 14.1 % (ref 11.5–15.5)
WBC: 7.2 10*3/uL (ref 4.0–10.5)
nRBC: 0 % (ref 0.0–0.2)

## 2020-12-14 LAB — COMPREHENSIVE METABOLIC PANEL
ALT: 55 U/L — ABNORMAL HIGH (ref 0–44)
AST: 44 U/L — ABNORMAL HIGH (ref 15–41)
Albumin: 2.2 g/dL — ABNORMAL LOW (ref 3.5–5.0)
Alkaline Phosphatase: 194 U/L — ABNORMAL HIGH (ref 38–126)
Anion gap: 10 (ref 5–15)
BUN: 64 mg/dL — ABNORMAL HIGH (ref 6–20)
CO2: 19 mmol/L — ABNORMAL LOW (ref 22–32)
Calcium: 9 mg/dL (ref 8.9–10.3)
Chloride: 106 mmol/L (ref 98–111)
Creatinine, Ser: 2.43 mg/dL — ABNORMAL HIGH (ref 0.44–1.00)
GFR, Estimated: 27 mL/min — ABNORMAL LOW (ref >60)
Glucose, Bld: 180 mg/dL — ABNORMAL HIGH (ref 70–99)
Potassium: 5.1 mmol/L (ref 3.5–5.1)
Sodium: 135 mmol/L (ref 135–145)
Total Bilirubin: 0.7 mg/dL (ref 0.3–1.2)
Total Protein: 6.3 g/dL — ABNORMAL LOW (ref 6.5–8.1)

## 2020-12-14 LAB — GLUCOSE, CAPILLARY
Glucose-Capillary: 162 mg/dL — ABNORMAL HIGH (ref 70–99)
Glucose-Capillary: 243 mg/dL — ABNORMAL HIGH (ref 70–99)
Glucose-Capillary: 175 mg/dL — ABNORMAL HIGH (ref 70–99)
Glucose-Capillary: 127 mg/dL — ABNORMAL HIGH (ref 70–99)

## 2020-12-14 LAB — BLOOD CULTURE ID PANEL (REFLEXED) - BCID2

## 2020-12-14 LAB — TYPE AND SCREEN
ABO/RH(D): O POS
Antibody Screen: NEGATIVE
Unit division: 0

## 2020-12-14 LAB — BPAM RBC
Blood Product Expiration Date: 202210162359
ISSUE DATE / TIME: 202209150837
Unit Type and Rh: 5100

## 2020-12-14 LAB — MAGNESIUM: Magnesium: 2.3 mg/dL (ref 1.7–2.4)

## 2020-12-14 MED ORDER — LOPERAMIDE HCL 2 MG PO CAPS
2.0000 mg | ORAL_CAPSULE | Freq: Four times a day (QID) | ORAL | Status: DC | PRN
  Administered 2020-12-14: 2 mg via ORAL
  Filled 2020-12-14: qty 1

## 2020-12-14 MED ORDER — AMLODIPINE BESYLATE 10 MG PO TABS
10.0000 mg | ORAL_TABLET | Freq: Every day | ORAL | Status: DC
  Administered 2020-12-14 – 2020-12-16 (×3): 10 mg via ORAL
  Filled 2020-12-14 (×3): qty 1

## 2020-12-14 MED ORDER — LACTATED RINGERS IV SOLN: INTRAVENOUS | Status: AC

## 2020-12-14 NOTE — Progress Notes (Signed)
PROGRESS NOTE                                                                                                                                                                                                             Patient Demographics:    [name], is a 30 y.o. female, DOB - 12/27/1990, ONG:[id number]  Outpatient Primary MD for the patient is [name], MD    LOS - 1  Admit date - 12/12/2020    Chief Complaint  Patient presents with   Unresponsive       Brief Narrative (HPI from H&P)  [name] is a 30 y.o. female with medical history significant of DM1, gastroparesis, CKD 3b, HTN, COVID-19 (multiple times).   Pt was admitted to hospital on 9/5 with AMS due to PRES from HTN emergency.  BP controlled, started on kepra for GTC on presentation, she was noted to have AKI after admission (suspected to be due to over control of BP initially), noted to be COVID+ (pt was essentially asymptomatic) started on Paxlovid.  D.Dimer was elevated at >20, started on empiric heparin gtt, VQ scan on 9/12 was negative for PE.  Pt was discharged home on 12/10/20.   On 12/12/20 she was found unresponsive by husband who called EMS and started CPR.  Per EMS pt found to have pulses on arrival but O2 sat 76%, improved with oxygen, she was brought to the ER with AMS, dehydration, AKI, Hypotension, Anaemia and suspicion for Accidental Percocet OD.   Subjective:   Patient in bed, appears comfortable, denies any headache, no fever, no chest pain or pressure, no shortness of breath , no abdominal pain. No new focal weakness.   Assessment  & Plan :     AMS with Acute Hypoxic Resp failure due to decreased Resp drive - most likely due to accidental narcotic overdose, with supportive care much improved, mental status is close to baseline, currently on room air and in no distress, counseled not to use narcotics in excess.  Avoiding further  strong narcotic use.  She has no focal deficits or headache, supple neck, continue to monitor closely.  2.  Recent COVID-19 infection.  Diagnosed on 12/03/2020.  Stable comes off of isolation on 12/14/2020.  3.  Dehydration, AKI.  Baseline CKD stage IIIa.  Continue IVF and monitor baseline  creatinine close to 1.7  4.  Anemia of chronic disease.  Due to underlying AKI will transfuse 1 unit of packed RBC on 12/13/2020.  No signs of ongoing brisk bleeding.  Continue PPI with outpatient age-appropriate work-up.  5.  Hyperkalemia. Improved post Lokelma.  6.  History of seizures.  On Keppra continue.  7.  Iron deficiency and vitamin B12 deficiency.  Placed on replacement.  8.  Hypertension.  Blood pressure improving will add Norvasc to Coreg for better control.  9.  Diabetic gastroparesis.  Reglan Premeal.  10. Chr. Diastolic CHF Last EF 44% - repeat TTE stable.  11. H/O recent R.BKA - L. Big toe amputation - L foot still has sores, poor care at home, [facility] following, Dr. [name] to see as well.  12.  Mild elevation in liver enzymes.  Likely shock liver causing asymptomatic transaminitis.  As blood pressure improves LFTs are getting better.    13. DM 2.  On Lantus and sliding scale will monitor and adjust.  Lab Results  Component Value Date   HGBA1C 7.2 (H) 11/13/2020   CBG (last 3)  Recent Labs    12/13/20 1609 12/13/20 2027 12/14/20 0756  GLUCAP 102* 165* 162*         Condition - Extremely Guarded  Family Communication  :  None present  Code Status :  Full  Consults  :  Dr. [name]  PUD Prophylaxis : PPI   Procedures  :     TTE -  1. Left ventricular ejection fraction, by estimation, is 65 to 70%. The left ventricle has normal function. The left ventricle has no regional wall motion abnormalities. There is mild left ventricular hypertrophy.   2. Right ventricular systolic function is low normal. The right ventricular size is normal. There is normal pulmonary artery systolic  pressure.   3. Mild mitral valve regurgitation.   4. Tricuspid valve regurgitation is moderate.   5. The aortic valve is normal in structure. Aortic valve regurgitation is not visualized.   6. The inferior vena cava is dilated in size with <50% respiratory variability, suggesting right atrial pressure of 15 mmHg.         Disposition Plan  :    Status is: Observation  Dispo: The patient is from: Home              Anticipated d/c is to: Home              Patient currently is not medically stable to d/c.   Difficult to place patient No   DVT Prophylaxis  :     enoxaparin (LOVENOX) injection 40 mg Start: 12/13/20 1400    Lab Results  Component Value Date   PLT 254 12/14/2020    Diet :  Diet Order             Diet Carb Modified Fluid consistency: Thin; Room service appropriate? Yes  Diet effective now                    Inpatient Medications  Scheduled Meds:  (feeding supplement) PROSource Plus  30 mL Oral BID BM   sodium chloride   Intravenous Once   ascorbic acid  500 mg Oral Daily   busPIRone  5 mg Oral TID   carvedilol  6.25 mg Oral BID WC   enoxaparin (LOVENOX) injection  40 mg Subcutaneous Q24H   ferrous sulfate  325 mg Oral TID WC   folic acid  1 mg  Oral Daily   gabapentin  100 mg Oral TID   insulin aspart  0-6 Units Subcutaneous TID WC   insulin glargine-yfgn  10 Units Subcutaneous Daily   levETIRAcetam  500 mg Oral BID   LORazepam  0.5 mg Oral BID   metoCLOPramide  5 mg Oral TID AC   nirmatrelvir/ritonavir EUA (renal dosing)  2 tablet Oral BID   pantoprazole  40 mg Oral Daily   vitamin B-12  1,000 mcg Oral Daily   zinc sulfate  220 mg Oral Daily   Continuous Infusions:  lactated ringers 100 mL/hr at 12/14/20 0914   PRN Meds:.acetaminophen, albuterol, cyclobenzaprine, dicyclomine, hydrALAZINE, Ipratropium-Albuterol, [DISCONTINUED] ondansetron **OR** ondansetron (ZOFRAN) IV, polyvinyl alcohol, traMADol, traZODone  Antibiotics  :     Anti-infectives (From admission, onward)    Start     Dose/Rate Route Frequency Ordered Stop   12/13/20 2200  nirmatrelvir/ritonavir EUA (renal dosing) (PAXLOVID) 2 tablet       Note to Pharmacy: Patient GFR is 35. Take nirmatrelvir (150 mg) one tablet twice daily for 5 days and ritonavir (100 mg) one tablet twice daily for 5 days.     2 tablet Oral 2 times daily 12/13/20 1334 12/15/20 0959   12/13/20 0245  nirmatrelvir/ritonavir EUA (renal dosing) (PAXLOVID) 2 tablet  Status:  Discontinued       Note to Pharmacy: Patient GFR is 35. Take nirmatrelvir (150 mg) one tablet twice daily for 5 days and ritonavir (100 mg) one tablet twice daily for 5 days.     2 tablet Oral 2 times daily 12/13/20 0244 12/13/20 1334        Time Spent in minutes  30   Lala Lund M.D on 12/14/2020 at 11:05 AM  To page go to [email or web address]   [facility] -  Office  [phone]    See all Orders from today for further details    Objective:   Vitals:   12/14/20 0028 12/14/20 0424 12/14/20 0452 12/14/20 0754  BP: 129/81 112/64  (!) 149/83  Pulse: 80 85    Resp: 11 17    Temp: 97.9 F (36.6 C) 98 F (36.7 C)  98.1 F (36.7 C)  TempSrc: Oral Oral  Oral  SpO2: 98% 92% 94%   Weight:      Height:        Wt Readings from Last 3 Encounters:  12/12/20 72.6 kg  12/07/20 75.2 kg  11/19/20 72.6 kg     Intake/Output Summary (Last 24 hours) at 12/14/2020 1105 Last data filed at 12/14/2020 0700 Gross per 24 hour  Intake --  Output 400 ml  Net -400 ml     Physical Exam  Awake Alert, No new F.N deficits, Normal affect [place].AT,PERRAL Supple Neck,No JVD, No cervical lymphadenopathy appriciated.  Symmetrical Chest wall movement, Good air movement bilaterally, CTAB RRR,No Gallops, Rubs or new Murmurs, No Parasternal Heave +ve B.Sounds, Abd Soft, No tenderness, No organomegaly appriciated, No rebound - guarding or rigidity. No Cyanosis, right BKA stump clean, left foot big toe amputated,  few chronic sores POA under bandage    Data Review:    CBC Recent Labs  Lab 12/08/20 0227 12/09/20 0905 12/10/20 0435 12/12/20 2235 12/13/20 0333 12/13/20 0500 12/14/20 0048  WBC 8.0 4.9 5.5 8.1  --  6.9 7.2  HGB 7.6* 7.9* 7.0* 7.2* 7.5* 6.8* 8.7*  HCT 23.9* 24.9* 22.1* 23.0* 22.0* 23.1* 26.9*  PLT 177 215 189 211  --  211 254  MCV 91.9 94.0 93.6 97.0  --  99.6 91.8  MCH 29.2 29.8 29.7 30.4  --  29.3 29.7  MCHC 31.8 31.7 31.7 31.3  --  29.4* 32.3  RDW 14.1 13.8 14.1 13.9  --  13.9 14.1  LYMPHSABS 1.1 1.2 1.5 1.2  --   --  1.1  MONOABS 0.6 0.5 0.6 0.8  --   --  0.7  EOSABS 0.5 0.3 0.4 0.2  --   --  0.4  BASOSABS 0.1 0.0 0.1 0.1  --   --  0.1    Recent Labs  Lab 12/08/20 0227 12/09/20 0905 12/10/20 0435 12/12/20 2235 12/12/20 2255 12/12/20 2309 12/13/20 0102 12/13/20 0333 12/13/20 0500 12/14/20 0048  NA 133* 135 131* 134*  --   --   --  135 133* 135  K 4.2 4.3 4.6 5.6*  --   --   --  5.4* 5.4* 5.1  CL 107 108 106 109  --   --   --   --  110 106  CO2 22 22 20* 16*  --   --   --   --  16* 19*  GLUCOSE 233* 307* 377* 217*  --   --   --   --  212* 180*  BUN 28* 35* 42* 61*  --   --   --   --  62* 64*  CREATININE 1.64* 1.77* 1.93* 2.50*  --   --   --   --  2.48* 2.43*  CALCIUM 7.9* 8.2* 7.9* 8.5*  --   --   --   --  8.4* 9.0  AST 22 27 34 134*  --   --   --   --   --  44*  ALT 10 16 22  62*  --   --   --   --   --  55*  ALKPHOS 94 92 96 247*  --   --   --   --   --  194*  BILITOT 0.5 0.5 0.3 0.5  --   --   --   --   --  0.7  ALBUMIN 1.7* 1.8* 1.7* 2.0*  --   --   --   --   --  2.2*  MG 1.6* 2.2 2.2  --   --   --   --   --   --  2.3  CRP 0.6 <0.5 0.6  --   --  8.4*  --   --   --   --   DDIMER 2.44* >20.00* 1.74*  --   --  4.42*  --   --   --   --   PROCALCITON  --   --   --   --   --  0.73  --   --   --   --   LATICACIDVEN  --   --   --   --  1.2  --  0.9  --   --   --   BNP  --   --   --  1,104.9*  --   --   --   --   --   --      ------------------------------------------------------------------------------------------------------------------ Recent Labs    12/12/20 2309  TRIG 143    Lab Results  Component Value Date   HGBA1C 7.2 (H) 11/13/2020   ------------------------------------------------------------------------------------------------------------------ No results for input(s): TSH, T4TOTAL, T3FREE, THYROIDAB in the last 72 hours.  Invalid input(s): FREET3  Cardiac Enzymes No results for input(s): CKMB, TROPONINI, MYOGLOBIN  in the last 168 hours.  Invalid input(s): CK ------------------------------------------------------------------------------------------------------------------    Component Value Date/Time   BNP 1,104.9 (H) 12/12/2020 2235    Micro Results Recent Results (from the past 240 hour(s))  SARS CORONAVIRUS 2 (TAT 6-24 HRS) Nasopharyngeal Nasopharyngeal Swab     Status: Abnormal   Collection Time: 12/04/20  3:03 PM   Specimen: Nasopharyngeal Swab  Result Value Ref Range Status   SARS Coronavirus 2 POSITIVE (A) NEGATIVE Final    Comment: (NOTE) SARS-CoV-2 target nucleic acids are DETECTED.  The SARS-CoV-2 RNA is generally detectable in upper and lower respiratory specimens during the acute phase of infection. Positive results are indicative of the presence of SARS-CoV-2 RNA. Clinical correlation with patient history and other diagnostic information is  necessary to determine patient infection status. Positive results do not rule out bacterial infection or co-infection with other viruses.  The expected result is Negative.  Fact Sheet for Patients: SugarRoll.be  Fact Sheet for Healthcare Providers: https://www.woods-mathews.com/  This test is not yet approved or cleared by the [place] FDA and  has been authorized for detection and/or diagnosis of SARS-CoV-2 by FDA under an Emergency Use Authorization (EUA). This EUA will  remain  in effect (meaning this test can be used) for the duration of the COVID-19 declaration under Section 564(b)(1) of the Act, 21 U. S.C. section 360bbb-3(b)(1), unless the authorization is terminated or revoked sooner.   Performed at [facility], [place] [address], [place], [place] [address]   Blood Culture (routine x 2)     Status: Abnormal (Preliminary result)   Collection Time: 12/12/20 11:20 PM   Specimen: BLOOD  Result Value Ref Range Status   Specimen Description BLOOD RIGHT ANTECUBITAL  Final   Special Requests   Final    BOTTLES DRAWN AEROBIC AND ANAEROBIC Blood Culture results may not be optimal due to an inadequate volume of blood received in culture bottles   Culture  Setup Time   Final    GRAM POSITIVE COCCI IN CLUSTERS IN BOTH AEROBIC AND ANAEROBIC BOTTLES CRITICAL RESULT CALLED TO, READ BACK BY AND VERIFIED WITH: L TRINH,PHARMD@[id number]  12/14/20 [place]    Culture (A)  Final    STAPHYLOCOCCUS EPIDERMIDIS THE SIGNIFICANCE OF ISOLATING THIS ORGANISM FROM A SINGLE SET OF BLOOD CULTURES WHEN MULTIPLE SETS ARE DRAWN IS UNCERTAIN. PLEASE NOTIFY THE MICROBIOLOGY DEPARTMENT WITHIN ONE WEEK IF SPECIATION AND SENSITIVITIES ARE REQUIRED. Performed at [facility], [place] [address], [place], [place] [address]    Report Status PENDING  Incomplete  Blood Culture ID Panel (Reflexed)     Status: Abnormal   Collection Time: 12/12/20 11:20 PM  Result Value Ref Range Status   Enterococcus faecalis NOT DETECTED NOT DETECTED Final   Enterococcus Faecium NOT DETECTED NOT DETECTED Final   Listeria monocytogenes NOT DETECTED NOT DETECTED Final   Staphylococcus species DETECTED (A) NOT DETECTED Final    Comment: CRITICAL RESULT CALLED TO, READ BACK BY AND VERIFIED WITH: L TRINH,PHARMD@[id number]  12/14/20 [place]    Staphylococcus aureus (BCID) NOT DETECTED NOT DETECTED Final   Staphylococcus epidermidis DETECTED (A) NOT DETECTED Final    Comment: Methicillin (oxacillin) resistant coagulase  negative staphylococcus. Possible blood culture contaminant (unless isolated from more than one blood culture draw or clinical case suggests pathogenicity). No antibiotic treatment is indicated for blood  culture contaminants. CRITICAL RESULT CALLED TO, READ BACK BY AND VERIFIED WITH: L TRINH,PHARMD@[id number]  12/14/20 [place]    Staphylococcus lugdunensis NOT DETECTED NOT DETECTED Final   Streptococcus species  NOT DETECTED NOT DETECTED Final   Streptococcus agalactiae NOT DETECTED NOT DETECTED Final   Streptococcus pneumoniae NOT DETECTED NOT DETECTED Final   Streptococcus pyogenes NOT DETECTED NOT DETECTED Final   A.calcoaceticus-baumannii NOT DETECTED NOT DETECTED Final   Bacteroides fragilis NOT DETECTED NOT DETECTED Final   Enterobacterales NOT DETECTED NOT DETECTED Final   Enterobacter cloacae complex NOT DETECTED NOT DETECTED Final   Escherichia coli NOT DETECTED NOT DETECTED Final   Klebsiella aerogenes NOT DETECTED NOT DETECTED Final   Klebsiella oxytoca NOT DETECTED NOT DETECTED Final   Klebsiella pneumoniae NOT DETECTED NOT DETECTED Final   Proteus species NOT DETECTED NOT DETECTED Final   Salmonella species NOT DETECTED NOT DETECTED Final   Serratia marcescens NOT DETECTED NOT DETECTED Final   Haemophilus influenzae NOT DETECTED NOT DETECTED Final   Neisseria meningitidis NOT DETECTED NOT DETECTED Final   Pseudomonas aeruginosa NOT DETECTED NOT DETECTED Final   Stenotrophomonas maltophilia NOT DETECTED NOT DETECTED Final   Candida albicans NOT DETECTED NOT DETECTED Final   Candida auris NOT DETECTED NOT DETECTED Final   Candida glabrata NOT DETECTED NOT DETECTED Final   Candida krusei NOT DETECTED NOT DETECTED Final   Candida parapsilosis NOT DETECTED NOT DETECTED Final   Candida tropicalis NOT DETECTED NOT DETECTED Final   Cryptococcus neoformans/gattii NOT DETECTED NOT DETECTED Final   Methicillin resistance mecA/C DETECTED (A) NOT DETECTED Final    Comment: CRITICAL RESULT CALLED  TO, READ BACK BY AND VERIFIED WITH: L TRINH,PHARMD@[id number]  12/14/20 [place] Performed at [facility], [address], [place], [place] [address]   Blood Culture (routine x 2)     Status: None (Preliminary result)   Collection Time: 12/12/20 11:30 PM   Specimen: BLOOD RIGHT HAND  Result Value Ref Range Status   Specimen Description BLOOD RIGHT HAND  Final   Special Requests   Final    BOTTLES DRAWN AEROBIC AND ANAEROBIC Blood Culture results may not be optimal due to an inadequate volume of blood received in culture bottles   Culture   Final    NO GROWTH 1 DAY Performed at [facility], [place] [address] [place], [place] [address]    Report Status PENDING  Incomplete

## 2020-12-14 NOTE — Evaluation (Signed)
Physical Therapy Evaluation Patient Details Name: [name] MRN: [id number] DOB: 02-06-1991 Today's Date: 12/14/2020  History of Present Illness  Pt is a 30 y.o. female admitted 9/14 with AMS after being found unresponsive at home by her husband. Recent hospitalization 9/5-9/12 for AMS due to PRES from HTN emergency. Pt also tested covid+ 9/6, assymptomatic. Past medical history significant for DM1, gastroparesis, CKD 3b, HTN, COVID-19 (multiple times), h/o L great toe amp, L foot wounds, and h/o R BKA (05/2020).   Clinical Impression  Pt admitted with above diagnosis. Pt from home with husband where she mobilizes primarily at wheelchair level, mod I transfers. She is currently awaiting RLE prosthesis. On eval she demonstrated mod I bed mobility and required supervision transfers. Pt currently with functional limitations due to the deficits listed below (see PT Problem List). Pt will benefit from skilled PT to increase their independence and safety with mobility to allow discharge to the venue listed below.          Recommendations for follow up therapy are one component of a multi-disciplinary discharge planning process, led by the attending physician.  Recommendations may be updated based on patient status, additional functional criteria and insurance authorization.  Follow Up Recommendations Home health PT (to OPPT when ready for prosthetic training)    Equipment Recommendations  None recommended by PT    Recommendations for Other Services       Precautions / Restrictions Precautions Precautions: Fall      Mobility  Bed Mobility Overal bed mobility: Modified Independent             General bed mobility comments: +rail    Transfers Overall transfer level: Needs assistance Equipment used: None Transfers: Stand Pivot Transfers   Stand pivot transfers: Supervision       General transfer comment: supervision for safety  Ambulation/Gait                 Stairs            Wheelchair Mobility    Modified Rankin (Stroke Patients Only)       Balance Overall balance assessment: Needs assistance Sitting-balance support: No upper extremity supported;Feet supported Sitting balance-Leahy Scale: Good     Standing balance support: During functional activity;Bilateral upper extremity supported Standing balance-Leahy Scale: Poor Standing balance comment: reliant on UE support due to R BKA                             Pertinent Vitals/Pain Pain Assessment: No/denies pain    Home Living Family/patient expects to be discharged to:: Private residence Living Arrangements: Spouse/significant other Available Help at Discharge: Family;Available 24 hours/day;Available PRN/intermittently;Friend(s) Type of Home: House Home Access: Stairs to enter Entrance Stairs-Rails: Can reach both Entrance Stairs-Number of Steps: 4 Home Layout: One level Home Equipment: Walker - 2 wheels;Wheelchair - [employer];Shower seat;Crutches      Prior Function Level of Independence: Needs assistance   Gait / Transfers Assistance Needed: wheelchair for primary mobility, mod I transfers  ADL's / Homemaking Assistance Needed: husband assists with bathing and LB dressing. pt reports wc/walker unable to fit in bathroom, uses BSC and sponge bathes with assist  Comments: 3 yo daughter at home, family assist with child care     Hand Dominance   Dominant Hand: Right    Extremity/Trunk Assessment   Upper Extremity Assessment Upper Extremity Assessment: Overall WFL for tasks assessed    Lower Extremity Assessment Lower  Extremity Assessment: Generalized weakness;RLE deficits/detail;LLE deficits/detail RLE Deficits / Details: h/o BKA LLE Deficits / Details: h/o great toe amp, non healing wounds    Cervical / Trunk Assessment Cervical / Trunk Assessment: Normal  Communication   Communication: No difficulties  Cognition  Arousal/Alertness: Awake/alert Behavior During Therapy: WFL for tasks assessed/performed;Flat affect Overall Cognitive Status: Within Functional Limits for tasks assessed                                        General Comments General comments (skin integrity, edema, etc.): dressings in place L foot wounds    Exercises     Assessment/Plan    PT Assessment Patient needs continued PT services  PT Problem List Decreased strength;Decreased mobility;Decreased balance       PT Treatment Interventions Therapeutic activities;Gait training;Therapeutic exercise;Patient/family education;Balance training;Functional mobility training    PT Goals (Current goals can be found in the Care Plan section)  Acute Rehab PT Goals Patient Stated Goal: home PT Goal Formulation: With patient Time For Goal Achievement: 12/28/20 Potential to Achieve Goals: Good    Frequency Min 3X/week   Barriers to discharge        Co-evaluation               AM-PAC PT "6 Clicks" Mobility  Outcome Measure Help needed turning from your back to your side while in a flat bed without using bedrails?: None Help needed moving from lying on your back to sitting on the side of a flat bed without using bedrails?: None Help needed moving to and from a bed to a chair (including a wheelchair)?: A Little Help needed standing up from a chair using your arms (e.g., wheelchair or bedside chair)?: A Little Help needed to walk in hospital room?: A Little Help needed climbing 3-5 steps with a railing? : A Little 6 Click Score: 20    End of Session   Activity Tolerance: Patient tolerated treatment well Patient left: in chair;with call bell/phone within reach;with chair alarm set Nurse Communication: Mobility status PT Visit Diagnosis: Other abnormalities of gait and mobility (R26.89);Difficulty in walking, not elsewhere classified (R26.2)    Time: 6754-4920 PT Time Calculation (min) (ACUTE ONLY): 22  min   Charges:   PT Evaluation $PT Eval Moderate Complexity: 1 Mod          Lorrin Goodell, PT  Office # [phone] Pager [phone]   Lorriane Shire 12/14/2020, 10:57 AM

## 2020-12-14 NOTE — Progress Notes (Signed)
PHARMACY - PHYSICIAN COMMUNICATION CRITICAL VALUE ALERT - BLOOD CULTURE IDENTIFICATION (BCID)  [name] is an 30 y.o. female who presented to [facility] on 12/12/2020 with a chief complaint of AMS, dehydration, AKI, hypotension, anaemia and suspicion for accidental Percocet OD. Patient is afebrile, and WBC are wnl.  Assessment: 1/4 bottles from 9/14 BCx is positive for Staph epidermis with mec A/C resistance. Most likely a contaminant.   Name of physician (or Provider) Contacted: Dr. Gean Birchwood  Current antibiotics: None  Changes to prescribed antibiotics recommended:  No antibiotics warranted.  Results for orders placed or performed during the hospital encounter of 12/12/20  Blood Culture ID Panel (Reflexed) (Collected: 12/12/2020 11:20 PM)  Result Value Ref Range   Enterococcus faecalis NOT DETECTED NOT DETECTED   Enterococcus Faecium NOT DETECTED NOT DETECTED   Listeria monocytogenes NOT DETECTED NOT DETECTED   Staphylococcus species DETECTED (A) NOT DETECTED   Staphylococcus aureus (BCID) NOT DETECTED NOT DETECTED   Staphylococcus epidermidis DETECTED (A) NOT DETECTED   Staphylococcus lugdunensis NOT DETECTED NOT DETECTED   Streptococcus species NOT DETECTED NOT DETECTED   Streptococcus agalactiae NOT DETECTED NOT DETECTED   Streptococcus pneumoniae NOT DETECTED NOT DETECTED   Streptococcus pyogenes NOT DETECTED NOT DETECTED   A.calcoaceticus-baumannii NOT DETECTED NOT DETECTED   Bacteroides fragilis NOT DETECTED NOT DETECTED   Enterobacterales NOT DETECTED NOT DETECTED   Enterobacter cloacae complex NOT DETECTED NOT DETECTED   Escherichia coli NOT DETECTED NOT DETECTED   Klebsiella aerogenes NOT DETECTED NOT DETECTED   Klebsiella oxytoca NOT DETECTED NOT DETECTED   Klebsiella pneumoniae NOT DETECTED NOT DETECTED   Proteus species NOT DETECTED NOT DETECTED   Salmonella species NOT DETECTED NOT DETECTED   Serratia marcescens NOT DETECTED NOT DETECTED    Haemophilus influenzae NOT DETECTED NOT DETECTED   Neisseria meningitidis NOT DETECTED NOT DETECTED   Pseudomonas aeruginosa NOT DETECTED NOT DETECTED   Stenotrophomonas maltophilia NOT DETECTED NOT DETECTED   Candida albicans NOT DETECTED NOT DETECTED   Candida auris NOT DETECTED NOT DETECTED   Candida glabrata NOT DETECTED NOT DETECTED   Candida krusei NOT DETECTED NOT DETECTED   Candida parapsilosis NOT DETECTED NOT DETECTED   Candida tropicalis NOT DETECTED NOT DETECTED   Cryptococcus neoformans/gattii NOT DETECTED NOT DETECTED   Methicillin resistance mecA/C DETECTED (A) NOT DETECTED    Collene Gobble, Student Pharmacist 12/14/2020  4:14 AM

## 2020-12-14 NOTE — Consult Note (Addendum)
WOC Nurse Consult Note: Patient receiving care in [facility] [phone]. Nurse in room, consult completed via [place] in room and information from the primary RN. Patient Covid +. Reason for Consult: wounds to left foot Wound type: callus on left great toe amputation site; MASD-ITD between toes 4 & 5 on left foot; eschar on left lateral foot just proximal to the 5th toe. Pressure Injury POA: Yes/No/NA Measurement: To be provided by the bedside RN in the flowsheet section  Wound bed: dry callus on great toe amputation site; black eschar on left lateral foot. Drainage (amount, consistency, odor) scant per primary RN between toes and lateral foot Periwound: intact Dressing procedure/placement/frequency: Apply iodine from the swabsticks or swab pads from clean utility to the left great toe amputation site, between toes 4 & 5, and along the left lateral side of the foot on the eschar there.  Allow to air dry. Can leave open to air, or cover with dry gauze.   Monitor the wound area(s) for worsening of condition such as: Signs/symptoms of infection,  Increase in size,  Development of or worsening of odor, Development of pain, or increased pain at the affected locations.  Notify the medical team if any of these develop.  Thank you for the consult.  Discussed plan of care with the patient and bedside nurse.  [place] nurse will not follow at this time.  Please re-consult the [place] team if needed.  Val Riles, RN, MSN, CWOCN, CNS-BC, pager [phone]

## 2020-12-14 NOTE — Consult Note (Signed)
ORTHOPAEDIC CONSULTATION  REQUESTING PHYSICIAN: Thurnell Lose, MD  Chief Complaint: Ischemic ulcers left foot.  HPI: [name] is a 30 y.o. female who presents with stable well-healed right transtibial amputation with ischemic ulcers and venous insufficiency swelling of the left lower extremity.  Patient has multiple ischemic ulcers to the left foot.  Past Medical History:  Diagnosis Date   Allergy    Anemia    Anxiety    Blood transfusion without reported diagnosis    Dec 2018   Cataract    right eye   COVID-19 03/2020   Depression    Diabetes type 1, uncontrolled ([place]) 11/14/2011   Since age 30   Fibromyalgia    Gastroparesis    GERD (gastroesophageal reflux disease)    Hypertension    Infection    UTI April 2016   Past Surgical History:  Procedure Laterality Date   AMPUTATION Right 04/13/2020   Procedure: 1ST RAY AMPUTATION RIGHT FOOT;  Surgeon: Newt Minion, MD;  Location: [place];  Service: Orthopedics;  Laterality: Right;   AMPUTATION Right 06/06/2020   Procedure: RIGHT BELOW KNEE AMPUTATION;  Surgeon: Newt Minion, MD;  Location: [place];  Service: Orthopedics;  Laterality: Right;   AMPUTATION Left    great toe   ANKLE SURGERY     APPLICATION OF WOUND VAC Right 06/06/2020   Procedure: APPLICATION OF WOUND VAC;  Surgeon: Newt Minion, MD;  Location: [place];  Service: Orthopedics;  Laterality: Right;   CHOLECYSTECTOMY  11/15/2011   Procedure: LAPAROSCOPIC CHOLECYSTECTOMY WITH INTRAOPERATIVE CHOLANGIOGRAM;  Surgeon: [name], MD;  Location: [facility];  Service: General;  Laterality: N/A;   COLONOSCOPY     COLONOSCOPY WITH PROPOFOL N/A 06/27/2017   Procedure: COLONOSCOPY WITH PROPOFOL;  Surgeon: Milus Banister, MD;  Location: [facility];  Service: Endoscopy;  Laterality: N/A;   ESOPHAGOGASTRODUODENOSCOPY  12/03/2011   Procedure: ESOPHAGOGASTRODUODENOSCOPY (EGD);  Surgeon: Beryle Beams, MD;  Location: [facility];  Service: Endoscopy;   Laterality: N/A;   FLEXIBLE SIGMOIDOSCOPY N/A 03/10/2017   Procedure: FLEXIBLE SIGMOIDOSCOPY;  Surgeon: Carol Ada, MD;  Location: [facility];  Service: Endoscopy;  Laterality: N/A;   INCISION AND DRAINAGE PERIRECTAL ABSCESS N/A 03/01/2017   Procedure: IRRIGATION AND DEBRIDEMENT PERIRECTAL ABSCESS;  Surgeon: Alphonsa Overall, MD;  Location: [facility];  Service: General;  Laterality: N/A;   IRRIGATION AND DEBRIDEMENT BUTTOCKS N/A 03/23/2017   Procedure: IRRIGATION AND DEBRIDEMENT BUTTOCKS, SETON PLACEMENT;  Surgeon: [name], MD;  Location: [facility];  Service: General;  Laterality: N/A;   LAPAROSCOPY  11/23/2011   Procedure: LAPAROSCOPY DIAGNOSTIC;  Surgeon: Edward Jolly, MD;  Location: [facility];  Service: General;  Laterality: N/A;   SIGMOIDOSCOPY     UPPER GASTROINTESTINAL ENDOSCOPY     WISDOM TOOTH EXTRACTION     Social History   Socioeconomic History   Marital status: Married    Spouse name: sergio   Number of children: 0   Years of education: college   Highest education level: Not on file  Occupational History   Occupation: Publishing rights manager call center    Employer: [employer]  Tobacco Use   Smoking status: Never   Smokeless tobacco: Never  Vaping Use   Vaping Use: Never used  Substance and Sexual Activity   Alcohol use: No    Alcohol/week: 0.0 standard drinks   Drug use: Not Currently    Types: Marijuana   Sexual activity: Not Currently    Partners: Male  Birth control/protection: None  Other Topics Concern   Not on file  Social History Narrative   Not on file   Social Determinants of Health   Financial Resource Strain: Not on file  Food Insecurity: Food Insecurity Present   Worried About [place] in the Last Year: Sometimes true   Ran Out of Food in the Last Year: Sometimes true  Transportation Needs: No Transportation Needs   Lack of Transportation (Medical): No   Lack of Transportation (Non-Medical): No  Physical Activity: Not on  file  Stress: Not on file  Social Connections: Not on file   Family History  Problem Relation Age of Onset   Diabetes Mother    Hypertension Father    Colon cancer Paternal Grandmother        pt thinks PGM was dx in her 40's   Diabetes Paternal Grandmother    Diabetes Maternal Grandmother    Diabetes Maternal Grandfather    Diabetes Paternal Grandfather    Diabetes Other    Breast cancer Paternal Aunt    Esophageal cancer Neg Hx    Liver cancer Neg Hx    Pancreatic cancer Neg Hx    Stomach cancer Neg Hx    Rectal cancer Neg Hx    - negative except otherwise stated in the family history section Allergies  Allergen Reactions   Other Anaphylaxis    Reaction to [place] nuts    Lactose Intolerance (Gi) Diarrhea   Prior to Admission medications   Medication Sig Start Date End Date Taking? Authorizing Provider  acetaminophen (TYLENOL) 500 MG tablet Take 1,000 mg by mouth every 6 (six) hours as needed for moderate pain or headache.   Yes [provider]  Albuterol Sulfate (PROAIR RESPICLICK) 841 (90 Base) MCG/ACT AEPB Inhale 2 puffs into the lungs daily as needed. Patient taking differently: Inhale 2 puffs into the lungs daily as needed (wheezing). 11/08/20 12/13/20 Yes Allwardt, Alyssa M, PA-C  amLODipine (NORVASC) 10 MG tablet Take 1 tablet (10 mg total) by mouth daily. 11/08/20  Yes Allwardt, Alyssa M, PA-C  blood glucose meter kit and supplies by Other route. Check blood sugar 3-4 times   Yes [provider]  busPIRone (BUSPAR) 5 MG tablet Take 1 tablet (5 mg total) by mouth 3 (three) times daily. 02/03/20  Yes [name], MD  Carboxymethylcell-Hypromellose (GENTEAL) 0.25-0.3 % GEL Place 1 drop into both eyes daily as needed (dryeyes).   Yes [provider]  carvedilol (COREG) 12.5 MG tablet Take 1 tablet (12.5 mg total) by mouth 2 (two) times daily with a meal. 04/16/20 02/02/21 Yes Dahal, Marlowe Aschoff, MD  cyclobenzaprine (FLEXERIL) 10 MG tablet Take 1 tablet  (10 mg total) by mouth 3 (three) times daily as needed for muscle spasms. 11/15/20  Yes [name], MD  dicyclomine (BENTYL) 20 MG tablet Take 1 tablet (20 mg total) by mouth every 8 (eight) hours as needed for spasms. 11/25/19  Yes Petrucelli, Samantha R, PA-C  diphenhydrAMINE (BENADRYL) 25 MG tablet Take 25 mg by mouth every 6 (six) hours as needed for itching.   Yes [provider]  EPINEPHrine 0.3 mg/0.3 mL IJ SOAJ injection Inject 0.3 mg into the muscle once as needed (anaphylaxis/allergic reaction). Patient taking differently: Inject 0.3 mg into the muscle once as needed for anaphylaxis. 07/16/20  Yes [name], MD  gabapentin (NEURONTIN) 100 MG capsule Take 200 mg by mouth 3 (three) times daily. 11/02/20  Yes [provider]  hydrALAZINE (  APRESOLINE) 50 MG tablet Take 50 mg by mouth 4 (four) times daily. 09/14/20  Yes [provider]  insulin glargine (LANTUS) 100 UNIT/ML injection Inject 0.05 mLs (5 Units total) into the skin daily. 12/10/20  Yes Sheikh, Omair Latif, DO  insulin lispro (HUMALOG) 100 UNIT/ML injection Inject 0-5 Units into the skin 3 (three) times daily before meals. Sliding scale   Yes [provider]  Insulin Pen Needle 32G X 4 MM MISC USE AS DIRECTED WITH INSULIN PENS 05/24/20 05/24/21 Yes Gonfa, Charlesetta [place], MD  Ipratropium-Albuterol (COMBIVENT) 20-100 MCG/ACT AERS respimat Inhale 1 puff into the lungs every 6 (six) hours as needed for wheezing or shortness of breath. 12/10/20  Yes Sheikh, Omair Latif, DO  levETIRAcetam (KEPPRA) 500 MG tablet Take 1 tablet (500 mg total) by mouth 2 (two) times daily. 12/10/20  Yes Sheikh, Omair Latif, DO  loperamide (IMODIUM) 2 MG capsule Take 6 mg by mouth daily as needed for diarrhea or loose stools.    Yes [provider]  LORazepam (ATIVAN) 0.5 MG tablet Take 1 tablet (0.5 mg total) by mouth 2 (two) times daily. 11/15/20  Yes [name], MD  metoCLOPramide (REGLAN) 5 MG tablet Take 1 tablet (5  mg total) by mouth 3 (three) times daily before meals. Please make and keep an appointment for further refills. 11/13/20 02/11/21 Yes [place] Donnamarie Poag, DO  nirmatrelvir/ritonavir EUA, renal dosing, (PAXLOVID) 10 x 150 MG & 10 x 100MG TABS Take 2 tablets by mouth 2 (two) times daily for 5 days. Patient GFR is 35. Take nirmatrelvir (150 mg) one tablet twice daily for 5 days and ritonavir (100 mg) one tablet twice daily for 5 days. 12/10/20 12/15/20 Yes Sheikh, Omair Latif, DO  Nutritional Supplements (,FEEDING SUPPLEMENT, PROSOURCE PLUS) liquid Take 30 mLs by mouth 2 (two) times daily between meals. 12/10/20  Yes Sheikh, Omair Latif, DO  ondansetron (ZOFRAN ODT) 4 MG disintegrating tablet Take 1 tablet (4 mg total) by mouth every 8 (eight) hours as needed for nausea or vomiting. 11/08/20  Yes Allwardt, Alyssa M, PA-C  oxyCODONE-acetaminophen (PERCOCET) 10-325 MG tablet Take 1 tablet by mouth every 4 (four) hours as needed for pain. 12/10/20  Yes [name], MD  traZODone (DESYREL) 50 MG tablet Take 0.5-1 tablets (25-50 mg total) by mouth at bedtime as needed for sleep. 11/15/20  Yes [name], MD  ascorbic acid (VITAMIN C) 500 MG tablet Take 1 tablet (500 mg total) by mouth daily. Patient not taking: Reported on 12/13/2020 12/10/20   Raiford Noble Latif, DO  Continuous Blood Gluc Sensor (DEXCOM G6 SENSOR) MISC USE AS DIRECTED EVERY 10 DAYS 07/28/20   Renato Shin, MD  Continuous Blood Gluc Transmit (DEXCOM G6 TRANSMITTER) MISC USE AS DIRECTED TO  MONITOR  GLUCOSE  LEVELS 05/10/20   Renato Shin, MD  guaiFENesin (MUCINEX) 600 MG 12 hr tablet Take 1 tablet (600 mg total) by mouth 2 (two) times daily for 5 days. Patient not taking: Reported on 12/13/2020 12/10/20 12/15/20  Raiford Noble Latif, DO  zinc sulfate 220 (50 Zn) MG capsule Take 1 capsule (220 mg total) by mouth daily. Patient not taking: Reported on 12/13/2020 12/10/20   Raiford Noble Latif, DO  potassium chloride SA (KLOR-CON) 20 MEQ tablet Take 1  tablet (20 mEq total) by mouth daily for 5 doses. 07/04/19 07/30/19  Janeece Fitting, PA-C   DG Chest Portable 1 View  Result Date: 12/12/2020 CLINICAL DATA:  Found unresponsive. EXAM: PORTABLE CHEST 1 VIEW COMPARISON:  December 10, 2020 FINDINGS: The right-sided PICC line seen on the prior study has been removed. Mild to moderate severity diffusely increased interstitial lung markings are seen with prominence of the perihilar pulmonary vasculature. Mild areas of atelectasis and/or infiltrate are also noted within the bilateral infrahilar regions. There is no evidence of a pleural effusion or pneumothorax. The cardiac silhouette is mildly increased in size. The visualized skeletal structures are unremarkable. IMPRESSION: Mild to moderate severity pulmonary vascular congestion with mild bilateral infrahilar atelectasis and/or infiltrate. Electronically Signed   By: Virgina Norfolk M.D.   On: 12/12/2020 23:11   ECHOCARDIOGRAM LIMITED  Result Date: 12/13/2020    ECHOCARDIOGRAM LIMITED REPORT   Patient Name:   KAELEI WHEELER Date of Exam: 12/13/2020 Medical Rec #:  [id number]      Height:       67.0 in Accession #:    [id number]     Weight:       160.0 lb Date of Birth:  09-Jun-1990      BSA:          1.839 m Patient Age:    30 years       BP:           107/75 mmHg Patient Gender: F              HR:           73 bpm. Exam Location:  Inpatient Procedure: Limited Echo, Cardiac Doppler and Color Doppler Indications:    Cardiomyopathy  History:        Patient has prior history of Echocardiogram examinations, most                 recent 05/16/2020. Signs/Symptoms:Altered Mental Status; Risk                 Factors:Hypertension and Diabetes. Covid+, CKD.  Sonographer:    Dustin Flock RDCS Referring Phys: [id number] Margaree Mackintosh [place]  1. Left ventricular ejection fraction, by estimation, is 65 to 70%. The left ventricle has normal function. The left ventricle has no regional wall motion abnormalities. There is mild  left ventricular hypertrophy.  2. Right ventricular systolic function is low normal. The right ventricular size is normal. There is normal pulmonary artery systolic pressure.  3. Mild mitral valve regurgitation.  4. Tricuspid valve regurgitation is moderate.  5. The aortic valve is normal in structure. Aortic valve regurgitation is not visualized.  6. The inferior vena cava is dilated in size with <50% respiratory variability, suggesting right atrial pressure of 15 mmHg. FINDINGS  Left Ventricle: Left ventricular ejection fraction, by estimation, is 65 to 70%. The left ventricle has normal function. The left ventricle has no regional wall motion abnormalities. The left ventricular internal cavity size was normal in size. There is  mild left ventricular hypertrophy. Right Ventricle: The right ventricular size is normal. Right ventricular systolic function is low normal. There is normal pulmonary artery systolic pressure. The tricuspid regurgitant velocity is 2.69 m/s, and with an assumed right atrial pressure of 3 mmHg, the estimated right ventricular systolic pressure is 37.6 mmHg. Right Atrium: Right atrial size was normal in size. Pericardium: Trivial pericardial effusion is present. Mitral Valve: There is mild thickening of the mitral valve leaflet(s). Mild mitral annular calcification. Mild mitral valve regurgitation. Tricuspid Valve: Tricuspid valve regurgitation is moderate. Aortic Valve: The aortic valve is normal in structure. Aortic valve regurgitation is not visualized. Aortic valve peak gradient measures 11.3 mmHg. Pulmonic Valve: The  pulmonic valve was normal in structure. Pulmonic valve regurgitation is trivial. Aorta: The aortic root and ascending aorta are structurally normal, with no evidence of dilitation. Venous: The inferior vena cava is dilated in size with less than 50% respiratory variability, suggesting right atrial pressure of 15 mmHg. IAS/Shunts: No atrial level shunt detected by color flow  Doppler. LEFT VENTRICLE PLAX 2D LVIDd:         4.80 cm      Diastology LVIDs:         3.10 cm      LV e' medial:    7.72 cm/s LV PW:         1.10 cm      LV E/e' medial:  14.2 LV IVS:        1.20 cm      LV e' lateral:   8.70 cm/s LVOT diam:     2.00 cm      LV E/e' lateral: 12.6 LVOT Area:     3.14 cm  LV Volumes (MOD) LV vol d, MOD A4C: 116.0 ml LV vol s, MOD A4C: 41.5 ml LV SV MOD A4C:     116.0 ml LEFT ATRIUM         Index LA diam:    4.50 cm 2.45 cm/m  AORTIC VALVE AV Vmax:      168.00 cm/s AV Peak Grad: 11.3 mmHg  AORTA Ao Root diam: 2.50 cm MITRAL VALVE                TRICUSPID VALVE MV Area (PHT): 3.74 cm     TR Peak grad:   28.9 mmHg MV Decel Time: 203 msec     TR Vmax:        269.00 cm/s MV E velocity: 110.00 cm/s MV A velocity: 86.60 cm/s   SHUNTS MV E/A ratio:  1.27         Systemic Diam: 2.00 cm Dorris Carnes MD Electronically signed by Dorris Carnes MD Signature Date/Time: 12/13/2020/4:34:47 PM    Final    - pertinent xrays, CT, MRI studies were reviewed and independently interpreted  Positive ROS: All other systems have been reviewed and were otherwise negative with the exception of those mentioned in the HPI and as above.  Physical Exam: General: Alert, no acute distress Psychiatric: Patient is competent for consent with normal mood and affect Lymphatic: No axillary or cervical lymphadenopathy Cardiovascular: No pedal edema Respiratory: No cyanosis, no use of accessory musculature GI: No organomegaly, abdomen is soft and non-tender    Images:  @ENCIMAGES @  Labs:  Lab Results  Component Value Date   HGBA1C 7.2 (H) 11/13/2020   HGBA1C 6.9 (H) 06/04/2020   HGBA1C 8.6 (H) 04/10/2020   ESRSEDRATE 103 (H) 12/10/2020   ESRSEDRATE 110 (H) 12/09/2020   ESRSEDRATE 92 (H) 12/08/2020   CRP 8.4 (H) 12/12/2020   CRP 0.6 12/10/2020   CRP <0.5 12/09/2020   LABURIC 6.8 (H) 01/23/2015   REPTSTATUS PENDING 12/12/2020   GRAMSTAIN  05/13/2020    MODERATE WBC PRESENT, PREDOMINANTLY PMN NO  ORGANISMS SEEN    CULT  12/12/2020    NO GROWTH 1 DAY Performed at [facility], [place] [address], [place], [place] [address]    LABORGA KLEBSIELLA PNEUMONIAE 03/01/2017    Lab Results  Component Value Date   ALBUMIN 2.2 (L) 12/14/2020   ALBUMIN 2.0 (L) 12/12/2020   ALBUMIN 1.7 (L) 12/10/2020   PREALBUMIN 17.9 (L) 06/09/2018   PREALBUMIN 21.0 02/18/2018  PREALBUMIN 18.1 03/18/2017   LABURIC 6.8 (H) 01/23/2015     CBC EXTENDED Latest Ref Rng & Units 12/14/2020 12/13/2020 12/13/2020  WBC 4.0 - 10.5 K/uL 7.2 6.9 -  RBC 3.87 - 5.11 MIL/uL 2.93(L) 2.32(L) 2.32(L)  HGB 12.0 - 15.0 g/dL 8.7(L) 6.8(LL) 7.5(L)  HCT 36.0 - 46.0 % 26.9(L) 23.1(L) 22.0(L)  PLT 150 - 400 K/uL 254 211 -  NEUTROABS 1.7 - 7.7 K/uL 4.9 - -  LYMPHSABS 0.7 - 4.0 K/uL 1.1 - -    Neurologic: Patient does not have protective sensation bilateral lower extremities.   MUSCULOSKELETAL:   Skin: Examination patient has pitting venous stasis swelling in the left lower extremity but no ulcers in the leg.  The right transtibial amputation is well-healed she has full range of motion of the right knee.  Examination of the left foot she has multiple ischemic ulcers that are partial-thickness but no exposed bone or tendon no deep abscess no cellulitis.  Review of her previous ankle-brachial indices show calcified vessels with triphasic flow however her ABIs show noncompressible pressures which are not reliable.  Assessment: Assessment: Well-healed right transtibial amputation with ischemic ulcers to the left foot.  Plan: Patient does not need any special wound care treatment.  Recommended she resume wearing her compression socks, to decrease the venous edema, to improve the microcirculation for healing.  I will follow-up in the office 1 week after discharge.  No indication for surgical intervention at this time.  Thank you for the consult and the opportunity to see Ms. [place], MD [place] [phone] 5:00 PM

## 2020-12-15 DIAGNOSIS — N179 Acute kidney failure, unspecified: Secondary | ICD-10-CM | POA: Diagnosis not present

## 2020-12-15 DIAGNOSIS — R400 Somnolence: Secondary | ICD-10-CM | POA: Diagnosis not present

## 2020-12-15 DIAGNOSIS — N183 Chronic kidney disease, stage 3 unspecified: Secondary | ICD-10-CM | POA: Diagnosis not present

## 2020-12-15 DIAGNOSIS — I5032 Chronic diastolic (congestive) heart failure: Secondary | ICD-10-CM | POA: Diagnosis not present

## 2020-12-15 DIAGNOSIS — E1022 Type 1 diabetes mellitus with diabetic chronic kidney disease: Secondary | ICD-10-CM | POA: Diagnosis not present

## 2020-12-15 DIAGNOSIS — J9601 Acute respiratory failure with hypoxia: Secondary | ICD-10-CM | POA: Diagnosis not present

## 2020-12-15 LAB — GLUCOSE, CAPILLARY
Glucose-Capillary: 125 mg/dL — ABNORMAL HIGH (ref 70–99)
Glucose-Capillary: 179 mg/dL — ABNORMAL HIGH (ref 70–99)
Glucose-Capillary: 189 mg/dL — ABNORMAL HIGH (ref 70–99)
Glucose-Capillary: 99 mg/dL (ref 70–99)

## 2020-12-15 LAB — COMPREHENSIVE METABOLIC PANEL
ALT: 49 U/L — ABNORMAL HIGH (ref 0–44)
AST: 35 U/L (ref 15–41)
Albumin: 2.3 g/dL — ABNORMAL LOW (ref 3.5–5.0)
Alkaline Phosphatase: 199 U/L — ABNORMAL HIGH (ref 38–126)
Anion gap: 11 (ref 5–15)
BUN: 52 mg/dL — ABNORMAL HIGH (ref 6–20)
CO2: 17 mmol/L — ABNORMAL LOW (ref 22–32)
Calcium: 8.8 mg/dL — ABNORMAL LOW (ref 8.9–10.3)
Chloride: 107 mmol/L (ref 98–111)
Creatinine, Ser: 1.93 mg/dL — ABNORMAL HIGH (ref 0.44–1.00)
GFR, Estimated: 35 mL/min — ABNORMAL LOW (ref >60)
Glucose, Bld: 148 mg/dL — ABNORMAL HIGH (ref 70–99)
Potassium: 4.4 mmol/L (ref 3.5–5.1)
Sodium: 135 mmol/L (ref 135–145)
Total Bilirubin: 0.4 mg/dL (ref 0.3–1.2)
Total Protein: 6.9 g/dL (ref 6.5–8.1)

## 2020-12-15 LAB — MAGNESIUM: Magnesium: 2.1 mg/dL (ref 1.7–2.4)

## 2020-12-15 LAB — CBC WITH DIFFERENTIAL/PLATELET
Abs Immature Granulocytes: 0.03 10*3/uL (ref 0.00–0.07)
Basophils Absolute: 0 10*3/uL (ref 0.0–0.1)
Basophils Relative: 1 %
Eosinophils Absolute: 0.5 10*3/uL (ref 0.0–0.5)
Eosinophils Relative: 6 %
HCT: 29.7 % — ABNORMAL LOW (ref 36.0–46.0)
Hemoglobin: 9.6 g/dL — ABNORMAL LOW (ref 12.0–15.0)
Immature Granulocytes: 0 %
Lymphocytes Relative: 13 %
Lymphs Abs: 1 10*3/uL (ref 0.7–4.0)
MCH: 29.6 pg (ref 26.0–34.0)
MCHC: 32.3 g/dL (ref 30.0–36.0)
MCV: 91.7 fL (ref 80.0–100.0)
Monocytes Absolute: 0.8 10*3/uL (ref 0.1–1.0)
Monocytes Relative: 10 %
Neutro Abs: 5.2 10*3/uL (ref 1.7–7.7)
Neutrophils Relative %: 70 %
Platelets: 291 10*3/uL (ref 150–400)
RBC: 3.24 MIL/uL — ABNORMAL LOW (ref 3.87–5.11)
RDW: 14 % (ref 11.5–15.5)
WBC: 7.4 10*3/uL (ref 4.0–10.5)
nRBC: 0 % (ref 0.0–0.2)

## 2020-12-15 LAB — CULTURE, BLOOD (ROUTINE X 2)

## 2020-12-15 NOTE — Progress Notes (Signed)
PROGRESS NOTE                                                                                                                                                                                                             Patient Demographics:    [name], is a 30 y.o. female, DOB - 1990/04/08, GQQ:[id number]  Outpatient Primary MD for the patient is [name], MD    LOS - 2  Admit date - 12/12/2020    Chief Complaint  Patient presents with   Unresponsive       Brief Narrative (HPI from H&P)  [name] is a 30 y.o. female with medical history significant of DM1, gastroparesis, CKD 3b, HTN, COVID-19 (multiple times).   Pt was admitted to hospital on 9/5 with AMS due to PRES from HTN emergency.  BP controlled, started on kepra for GTC on presentation, she was noted to have AKI after admission (suspected to be due to over control of BP initially), noted to be COVID+ (pt was essentially asymptomatic) started on Paxlovid.  D.Dimer was elevated at >20, started on empiric heparin gtt, VQ scan on 9/12 was negative for PE.  Pt was discharged home on 12/10/20.   On 12/12/20 she was found unresponsive by husband who called EMS and started CPR.  Per EMS pt found to have pulses on arrival but O2 sat 76%, improved with oxygen, she was brought to the ER with AMS, dehydration, AKI, Hypotension, Anaemia and suspicion for Accidental Percocet OD.   Subjective:   Patient in bed, appears comfortable, denies any headache, no fever, no chest pain or pressure, no shortness of breath , no abdominal pain. No new focal weakness.   Assessment  & Plan :     AMS with Acute Hypoxic Resp failure due to decreased Resp drive - most likely due to accidental narcotic overdose, with supportive care much improved, mental status is close to baseline, currently on room air and in no distress, counseled not to use narcotics in excess.  Avoiding further  strong narcotic use.  She has no focal deficits or headache, supple neck, continue to monitor closely.  2.  Recent COVID-19 infection.  Diagnosed on 12/03/2020.  Stable comes off of isolation on 12/14/2020.  3.  Dehydration, AKI.  Baseline CKD stage IIIa.  Resolved after IVF.  4.  Anemia of chronic disease.  Due to underlying AKI will transfuse 1 unit of packed RBC on 12/13/2020.  No signs of ongoing brisk bleeding.  Continue PPI with outpatient age-appropriate work-up.  5.  Hyperkalemia. Improved post Lokelma.  6.  History of seizures.  On Keppra continue.  7.  Iron deficiency and vitamin B12 deficiency.  Placed on replacement.  8.  Hypertension.  Blood pressure improving will add Norvasc to Coreg for better control.  9.  Diabetic gastroparesis.  Reglan Premeal.  10. Chr. Diastolic CHF Last EF 80% - repeat TTE stable.  11. H/O recent R.BKA - L. Big toe amputation - L foot still has sores, poor care at home, [facility] following, Dr. [name] also saw the patient - supportive Rx, 1/2 set blood culture contaminated with staph epidermis.  No signs of active infection.  Monitor.  12.  Mild elevation in liver enzymes.  Likely shock liver causing asymptomatic transaminitis.  As blood pressure improves LFTs are getting better.    13. DM 2.  On Lantus and sliding scale will monitor and adjust.  Lab Results  Component Value Date   HGBA1C 7.2 (H) 11/13/2020   CBG (last 3)  Recent Labs    12/14/20 1703 12/14/20 2046 12/15/20 0757  GLUCAP 175* 127* 125*         Condition - Extremely Guarded  Family Communication  :  None present  Code Status :  Full  Consults  :  Dr. [name]  PUD Prophylaxis : PPI   Procedures  :     TTE -  1. Left ventricular ejection fraction, by estimation, is 65 to 70%. The left ventricle has normal function. The left ventricle has no regional wall motion abnormalities. There is mild left ventricular hypertrophy.   2. Right ventricular systolic function is low  normal. The right ventricular size is normal. There is normal pulmonary artery systolic pressure.   3. Mild mitral valve regurgitation.   4. Tricuspid valve regurgitation is moderate.   5. The aortic valve is normal in structure. Aortic valve regurgitation is not visualized.   6. The inferior vena cava is dilated in size with <50% respiratory variability, suggesting right atrial pressure of 15 mmHg.         Disposition Plan  :    Status is: Inpt  Dispo: The patient is from: Home              Anticipated d/c is to: Home              Patient currently is not medically stable to d/c.   Difficult to place patient No   DVT Prophylaxis  :     enoxaparin (LOVENOX) injection 40 mg Start: 12/13/20 1400    Lab Results  Component Value Date   PLT 291 12/15/2020    Diet :  Diet Order             Diet Carb Modified Fluid consistency: Thin; Room service appropriate? Yes  Diet effective now                    Inpatient Medications  Scheduled Meds:  (feeding supplement) PROSource Plus  30 mL Oral BID BM   sodium chloride   Intravenous Once   amLODipine  10 mg Oral Daily   ascorbic acid  500 mg Oral Daily   busPIRone  5 mg Oral TID   carvedilol  6.25 mg Oral BID WC   enoxaparin (LOVENOX) injection  40  mg Subcutaneous Q24H   ferrous sulfate  325 mg Oral TID WC   folic acid  1 mg Oral Daily   gabapentin  100 mg Oral TID   insulin aspart  0-6 Units Subcutaneous TID WC   insulin glargine-yfgn  10 Units Subcutaneous Daily   levETIRAcetam  500 mg Oral BID   LORazepam  0.5 mg Oral BID   metoCLOPramide  5 mg Oral TID AC   pantoprazole  40 mg Oral Daily   vitamin B-12  1,000 mcg Oral Daily   zinc sulfate  220 mg Oral Daily   Continuous Infusions:   PRN Meds:.acetaminophen, albuterol, cyclobenzaprine, dicyclomine, hydrALAZINE, Ipratropium-Albuterol, loperamide, [DISCONTINUED] ondansetron **OR** ondansetron (ZOFRAN) IV, polyvinyl alcohol, traMADol, traZODone  Antibiotics  :     Anti-infectives (From admission, onward)    Start     Dose/Rate Route Frequency Ordered Stop   12/13/20 2200  nirmatrelvir/ritonavir EUA (renal dosing) (PAXLOVID) 2 tablet       Note to Pharmacy: Patient GFR is 35. Take nirmatrelvir (150 mg) one tablet twice daily for 5 days and ritonavir (100 mg) one tablet twice daily for 5 days.     2 tablet Oral 2 times daily 12/13/20 1334 12/14/20 2311   12/13/20 0245  nirmatrelvir/ritonavir EUA (renal dosing) (PAXLOVID) 2 tablet  Status:  Discontinued       Note to Pharmacy: Patient GFR is 35. Take nirmatrelvir (150 mg) one tablet twice daily for 5 days and ritonavir (100 mg) one tablet twice daily for 5 days.     2 tablet Oral 2 times daily 12/13/20 0244 12/13/20 1334        Time Spent in minutes  30   Lala Lund M.D on 12/15/2020 at 11:39 AM  To page go to [email or web address]   [facility] -  Office  [phone]    See all Orders from today for further details    Objective:   Vitals:   12/14/20 2003 12/14/20 2353 12/15/20 0410 12/15/20 0755  BP: 126/83 134/80 131/84 (!) 145/85  Pulse: 88 93 87 92  Resp: 15 18 15 16   Temp: 97.6 F (36.4 C) 97.8 F (36.6 C) 97.9 F (36.6 C) 97.7 F (36.5 C)  TempSrc: Oral Oral Oral Oral  SpO2: 98% 98% 100%   Weight:      Height:        Wt Readings from Last 3 Encounters:  12/12/20 72.6 kg  12/07/20 75.2 kg  11/19/20 72.6 kg     Intake/Output Summary (Last 24 hours) at 12/15/2020 1139 Last data filed at 12/14/2020 2305 Gross per 24 hour  Intake 1360.18 ml  Output --  Net 1360.18 ml     Physical Exam  Awake Alert, No new F.N deficits, Normal affect [place].AT,PERRAL Supple Neck,No JVD, No cervical lymphadenopathy appriciated.  Symmetrical Chest wall movement, Good air movement bilaterally, CTAB RRR,No Gallops, Rubs or new Murmurs, No Parasternal Heave +ve B.Sounds, Abd Soft, No tenderness, No organomegaly appriciated, No rebound - guarding or rigidity. Right BKA stump clean,  left foot big toe amputated, few chronic sores POA under bandage    Data Review:    CBC Recent Labs  Lab 12/09/20 0905 12/10/20 0435 12/12/20 2235 12/13/20 0333 12/13/20 0500 12/14/20 0048 12/15/20 0058  WBC 4.9 5.5 8.1  --  6.9 7.2 7.4  HGB 7.9* 7.0* 7.2* 7.5* 6.8* 8.7* 9.6*  HCT 24.9* 22.1* 23.0* 22.0* 23.1* 26.9* 29.7*  PLT 215 189 211  --  211 254 291  MCV 94.0 93.6  97.0  --  99.6 91.8 91.7  MCH 29.8 29.7 30.4  --  29.3 29.7 29.6  MCHC 31.7 31.7 31.3  --  29.4* 32.3 32.3  RDW 13.8 14.1 13.9  --  13.9 14.1 14.0  LYMPHSABS 1.2 1.5 1.2  --   --  1.1 1.0  MONOABS 0.5 0.6 0.8  --   --  0.7 0.8  EOSABS 0.3 0.4 0.2  --   --  0.4 0.5  BASOSABS 0.0 0.1 0.1  --   --  0.1 0.0    Recent Labs  Lab 12/09/20 0905 12/10/20 0435 12/12/20 2235 12/12/20 2255 12/12/20 2309 12/13/20 0102 12/13/20 0333 12/13/20 0500 12/14/20 0048 12/15/20 0058  NA 135 131* 134*  --   --   --  135 133* 135 135  K 4.3 4.6 5.6*  --   --   --  5.4* 5.4* 5.1 4.4  CL 108 106 109  --   --   --   --  110 106 107  CO2 22 20* 16*  --   --   --   --  16* 19* 17*  GLUCOSE 307* 377* 217*  --   --   --   --  212* 180* 148*  BUN 35* 42* 61*  --   --   --   --  62* 64* 52*  CREATININE 1.77* 1.93* 2.50*  --   --   --   --  2.48* 2.43* 1.93*  CALCIUM 8.2* 7.9* 8.5*  --   --   --   --  8.4* 9.0 8.8*  AST 27 34 134*  --   --   --   --   --  44* 35  ALT 16 22 62*  --   --   --   --   --  55* 49*  ALKPHOS 92 96 247*  --   --   --   --   --  194* 199*  BILITOT 0.5 0.3 0.5  --   --   --   --   --  0.7 0.4  ALBUMIN 1.8* 1.7* 2.0*  --   --   --   --   --  2.2* 2.3*  MG 2.2 2.2  --   --   --   --   --   --  2.3 2.1  CRP <0.5 0.6  --   --  8.4*  --   --   --   --   --   DDIMER >20.00* 1.74*  --   --  4.42*  --   --   --   --   --   PROCALCITON  --   --   --   --  0.73  --   --   --   --   --   LATICACIDVEN  --   --   --  1.2  --  0.9  --   --   --   --   BNP  --   --  1,104.9*  --   --   --   --   --   --   --      ------------------------------------------------------------------------------------------------------------------ Recent Labs    12/12/20 2309  TRIG 143    Lab Results  Component Value Date   HGBA1C 7.2 (H) 11/13/2020   ------------------------------------------------------------------------------------------------------------------ No results for input(s): TSH, T4TOTAL, T3FREE, THYROIDAB in the last 72 hours.  Invalid input(s): FREET3  Cardiac Enzymes No results for input(s): CKMB, TROPONINI, MYOGLOBIN in the last 168 hours.  Invalid input(s): CK ------------------------------------------------------------------------------------------------------------------    Component Value Date/Time   BNP 1,104.9 (H) 12/12/2020 2235

## 2020-12-16 DIAGNOSIS — J9601 Acute respiratory failure with hypoxia: Secondary | ICD-10-CM | POA: Diagnosis not present

## 2020-12-16 DIAGNOSIS — R400 Somnolence: Secondary | ICD-10-CM

## 2020-12-16 DIAGNOSIS — N179 Acute kidney failure, unspecified: Secondary | ICD-10-CM | POA: Diagnosis not present

## 2020-12-16 DIAGNOSIS — E1022 Type 1 diabetes mellitus with diabetic chronic kidney disease: Secondary | ICD-10-CM | POA: Diagnosis not present

## 2020-12-16 DIAGNOSIS — I5032 Chronic diastolic (congestive) heart failure: Secondary | ICD-10-CM | POA: Diagnosis not present

## 2020-12-16 DIAGNOSIS — N183 Chronic kidney disease, stage 3 unspecified: Secondary | ICD-10-CM | POA: Diagnosis not present

## 2020-12-16 LAB — COMPREHENSIVE METABOLIC PANEL
ALT: 36 U/L (ref 0–44)
AST: 24 U/L (ref 15–41)
Albumin: 2.3 g/dL — ABNORMAL LOW (ref 3.5–5.0)
Alkaline Phosphatase: 174 U/L — ABNORMAL HIGH (ref 38–126)
Anion gap: 10 (ref 5–15)
BUN: 49 mg/dL — ABNORMAL HIGH (ref 6–20)
CO2: 16 mmol/L — ABNORMAL LOW (ref 22–32)
Calcium: 8.8 mg/dL — ABNORMAL LOW (ref 8.9–10.3)
Chloride: 109 mmol/L (ref 98–111)
Creatinine, Ser: 1.73 mg/dL — ABNORMAL HIGH (ref 0.44–1.00)
GFR, Estimated: 40 mL/min — ABNORMAL LOW (ref >60)
Glucose, Bld: 127 mg/dL — ABNORMAL HIGH (ref 70–99)
Potassium: 4.7 mmol/L (ref 3.5–5.1)
Sodium: 135 mmol/L (ref 135–145)
Total Bilirubin: 0.5 mg/dL (ref 0.3–1.2)
Total Protein: 7.1 g/dL (ref 6.5–8.1)

## 2020-12-16 LAB — CBC WITH DIFFERENTIAL/PLATELET
Abs Immature Granulocytes: 0.03 10*3/uL (ref 0.00–0.07)
Basophils Absolute: 0.1 10*3/uL (ref 0.0–0.1)
Basophils Relative: 1 %
Eosinophils Absolute: 0.4 10*3/uL (ref 0.0–0.5)
Eosinophils Relative: 7 %
HCT: 29.7 % — ABNORMAL LOW (ref 36.0–46.0)
Hemoglobin: 9.2 g/dL — ABNORMAL LOW (ref 12.0–15.0)
Immature Granulocytes: 1 %
Lymphocytes Relative: 16 %
Lymphs Abs: 1 10*3/uL (ref 0.7–4.0)
MCH: 28.8 pg (ref 26.0–34.0)
MCHC: 31 g/dL (ref 30.0–36.0)
MCV: 93.1 fL (ref 80.0–100.0)
Monocytes Absolute: 0.5 10*3/uL (ref 0.1–1.0)
Monocytes Relative: 8 %
Neutro Abs: 4.2 10*3/uL (ref 1.7–7.7)
Neutrophils Relative %: 67 %
Platelets: 304 10*3/uL (ref 150–400)
RBC: 3.19 MIL/uL — ABNORMAL LOW (ref 3.87–5.11)
RDW: 13.8 % (ref 11.5–15.5)
WBC: 6.2 10*3/uL (ref 4.0–10.5)
nRBC: 0 % (ref 0.0–0.2)

## 2020-12-16 LAB — MAGNESIUM: Magnesium: 2 mg/dL (ref 1.7–2.4)

## 2020-12-16 LAB — GLUCOSE, CAPILLARY: Glucose-Capillary: 194 mg/dL — ABNORMAL HIGH (ref 70–99)

## 2020-12-16 MED ORDER — CLONIDINE HCL 0.2 MG PO TABS
0.2000 mg | ORAL_TABLET | Freq: Two times a day (BID) | ORAL | Status: DC
  Administered 2020-12-16: 0.2 mg via ORAL
  Filled 2020-12-16: qty 1

## 2020-12-16 MED ORDER — CYANOCOBALAMIN 1000 MCG PO TABS: 1000.0000 ug | ORAL_TABLET | Freq: Every day | ORAL | 0 refills | Status: AC

## 2020-12-16 MED ORDER — FERROUS SULFATE 325 (65 FE) MG PO TABS: 325.0000 mg | ORAL_TABLET | Freq: Two times a day (BID) | ORAL | 0 refills | Status: AC

## 2020-12-16 MED ORDER — FOLIC ACID 1 MG PO TABS: 1.0000 mg | ORAL_TABLET | Freq: Every day | ORAL | 0 refills | Status: AC

## 2020-12-16 MED ORDER — INFLUENZA VAC SPLIT QUAD 0.5 ML IM SUSY: 0.5000 mL | PREFILLED_SYRINGE | INTRAMUSCULAR | Status: DC

## 2020-12-16 MED ORDER — INFLUENZA VAC SPLIT QUAD 0.5 ML IM SUSY
0.5000 mL | PREFILLED_SYRINGE | INTRAMUSCULAR | Status: AC | PRN
  Administered 2020-12-16: 0.5 mL via INTRAMUSCULAR
  Filled 2020-12-16: qty 0.5

## 2020-12-16 MED ORDER — PANTOPRAZOLE SODIUM 40 MG PO TBEC: 40.0000 mg | DELAYED_RELEASE_TABLET | Freq: Every day | ORAL | 0 refills | Status: AC

## 2020-12-16 NOTE — Progress Notes (Signed)
Discharge education and packet provided to patient at bedside, all questions and concerns addressed, visitor at bedside for transport.

## 2020-12-16 NOTE — TOC Transition Note (Signed)
Transition of Care [facility]) - CM/SW Discharge Note   Patient Details  Name: [name] MRN: [id number] Date of Birth: March 11, 1991  [facility] CM/SW Contact:  Carles Collet, RN Phone Number: 12/16/2020, 8:43 AM   Clinical Narrative:    Spoke to patient over the phone.  Discussed DC plan, and therapy recs. Unable to secure [facility], patient agreed to outpatient therapy at [facility]. She understands to call and make appointment Monday, this has been added to AVS. Referral has been made electronically. Patient has all needed DME at home. No other TOC needs identified.    Final next level of care: Home/Self Care Barriers to Discharge: No Barriers Identified   Patient Goals and CMS Choice        Discharge Placement                       Discharge Plan and Services                                     Social Determinants of Health (SDOH) Interventions     Readmission Risk Interventions Readmission Risk Prevention Plan 12/10/2020 03/22/2020 09/26/2019  Transportation Screening Complete Complete Complete  PCP or Specialist Appt within 3-5 Days - - -  HRI or [place] or Home Care Consult comments - - -  Social Work Scientific laboratory technician for Fellows consult not completed comments - - -  Palliative Care Screening - - -  Medication Review Press photographer) - Complete -  PCP or Specialist appointment within 3-5 days of discharge - Complete Complete  HRI or [place] Complete Complete Complete  SW Recovery Care/Counseling Consult Complete Complete Complete  Palliative Care Screening Not Applicable Not Applicable Not [place] Not Applicable Not Applicable Not Applicable  Some recent data might be hidden

## 2020-12-16 NOTE — Discharge Summary (Signed)
[name] RUE:[id number] DOB: May 12, 1990 DOA: 12/12/2020  PCP: [name], MD  Admit date: 12/12/2020  Discharge date: 12/16/2020  Admitted From: Home   Disposition:  Home   Recommendations for Outpatient Follow-up:   Follow up with PCP in 1-2 weeks  PCP Please obtain BMP/CBC, 2 view CXR in 1week,  (see Discharge instructions)   PCP Please follow up on the following pending results: Check CBC, CMP, anemia panel in 7 to 10 days.   Home Health: PT-OT, RN if qualifies Equipment/Devices: None  Consultations: Dr [name] Discharge Condition: Stable    CODE STATUS: Full    Diet Recommendation: Heart Healthy Low Carb  Diet Order             Diet Carb Modified Fluid consistency: Thin; Room service appropriate? Yes  Diet effective now                    Chief Complaint  Patient presents with   Unresponsive     Brief history of present illness from the day of admission and additional interim summary    GENIENE LIST is a 30 y.o. female with medical history significant of DM1, gastroparesis, CKD 3b, HTN, COVID-19 (multiple times).   Pt was admitted to hospital on 9/5 with AMS due to PRES from HTN emergency.  BP controlled, started on kepra for GTC on presentation, she was noted to have AKI after admission (suspected to be due to over control of BP initially), noted to be COVID+ (pt was essentially asymptomatic) started on Paxlovid.  D.Dimer was elevated at >20, started on empiric heparin gtt, VQ scan on 9/12 was negative for PE.  Pt was discharged home on 12/10/20.    On 12/12/20 she was found unresponsive by husband who called EMS and started CPR.  Per EMS pt found to have pulses on arrival but O2 sat 76%, improved with oxygen, she was brought to the ER with AMS, dehydration, AKI, Hypotension, Anaemia and  suspicion for Accidental Percocet OD.                                                                 Hospital Course    AMS with Acute Hypoxic Resp failure due to decreased Resp drive - most likely due to accidental narcotic overdose, with supportive care much improved, mental status is close to baseline, currently on room air and in no distress, counseled not to use narcotics in excess.  Avoiding further strong narcotic use.  She has no focal deficits or headache, supple neck, now at baseline, wants to go home.   2.  Recent COVID-19 infection.  Diagnosed on 12/03/2020.  Stable comes off of isolation on 12/14/2020.   3.  Dehydration, AKI.  Baseline CKD stage IIIa.  Resolved after IVF.  4.  Anemia of chronic disease.  Due to underlying AKI will transfuse 1 unit of packed RBC on 12/13/2020.  No signs of ongoing brisk bleeding.  Continue PPI with outpatient age-appropriate work-up.   5.  Hyperkalemia. Improved post Lokelma.   6.  History of seizures.  On Keppra continue.   7.  Iron deficiency and vitamin B12 deficiency.  Placed on replacement.   8.  Hypertension.  Blood pressure improving continue home Rx and follow with PCP.   9.  Diabetic gastroparesis.  Reglan Premeal.   10. Chr. Diastolic CHF Last EF 83% - repeat TTE stable.   11. H/O recent R.BKA - L. Big toe amputation - L foot still has sores, poor care at home, [facility] following, Dr. [name] also saw the patient - supportive Rx, 1/2 set blood culture contaminated with staph epidermis.  No signs of active infection.  Continue care follow-up with Dr. [name] post discharge.   12.  Mild elevation in liver enzymes.  Likely shock liver causing asymptomatic transaminitis.  As blood pressure improves LFTs are getting better, PCP top recheck in 7-10 days.   13. DM 2.  Continue home regimen.      Discharge diagnosis     Principal Problem:   Altered mental status Active Problems:   Hypertension associated with diabetes (HCC)   Chronic  anemia   AKI (acute kidney injury) ([place])   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 ([place])   COVID-19 virus infection   Type 1 diabetes mellitus with other specified complication (HCC)   Acute respiratory failure with hypoxia (HCC)   Chronic diastolic CHF (congestive heart failure) (HCC)   Non-pressure chronic ulcer of other part of left foot limited to breakdown of skin (HCC)   PVD (peripheral vascular disease) (HCC)   Hx of BKA, right (HCC)   Venous stasis of both lower extremities    Discharge instructions    Discharge Instructions     Ambulatory referral to Physical Therapy   Complete by: As directed    Pt from home with husband where she mobilizes primarily at wheelchair level, mod I transfers. She is currently awaiting RLE prosthesis. On eval she demonstrated mod I bed mobility and required supervision transfers. Pt currently with functional limitations due to the deficits listed below (see PT Problem List). Pt will benefit from skilled PT to increase their independence and safety with mobility to allow discharge to the venue listed below   Discharge instructions   Complete by: As directed    Follow with Primary MD [name], MD in 7 days   Get CBC, CMP, 2 view Chest X ray -  checked next visit within 1 week by Primary MD    Activity: As tolerated with Full fall precautions use walker/cane & assistance as needed  Disposition Home    Diet: Heart Healthy Low Carb  Accuchecks 4 times/day, Once in AM empty stomach and then before each meal. Log in all results and show them to your Prim.MD in 3 days. If any glucose reading is under 80 or above 300 call your Prim MD immidiately. Follow Low glucose instructions for glucose under 80 as instructed.   Special Instructions: If you have smoked or chewed Tobacco  in the last 2 yrs please stop smoking, stop any regular Alcohol  and or any Recreational drug use.  On your next visit with your primary care physician please Get  Medicines reviewed and adjusted.  Please request your Prim.MD to go  over all Hospital Tests and Procedure/Radiological results at the follow up, please get all Hospital records sent to your Prim MD by signing hospital release before you go home.  If you experience worsening of your admission symptoms, develop shortness of breath, life threatening emergency, suicidal or homicidal thoughts you must seek medical attention immediately by calling 911 or calling your MD immediately  if symptoms less severe.  You Must read complete instructions/literature along with all the possible adverse reactions/side effects for all the Medicines you take and that have been prescribed to you. Take any new Medicines after you have completely understood and accpet all the possible adverse reactions/side effects.   Discharge wound care:   Complete by: As directed    Daily - keep L foot wounds clean and dry at times, continue previous wound care.   Increase activity slowly   Complete by: As directed        Discharge Medications   Allergies as of 12/16/2020       Reactions   Other Anaphylaxis   Reaction to [place] nuts   Lactose Intolerance (gi) Diarrhea        Medication List     STOP taking these medications    ascorbic acid 500 MG tablet Commonly known as: VITAMIN C   diphenhydrAMINE 25 MG tablet Commonly known as: BENADRYL   guaiFENesin 600 MG 12 hr tablet Commonly known as: MUCINEX   nirmatrelvir/ritonavir EUA (renal dosing) 10 x 150 MG & 10 x 100MG Tabs Commonly known as: PAXLOVID   oxyCODONE-acetaminophen 10-325 MG tablet Commonly known as: PERCOCET       TAKE these medications    (feeding supplement) PROSource Plus liquid Take 30 mLs by mouth 2 (two) times daily between meals.   acetaminophen 500 MG tablet Commonly known as: TYLENOL Take 1,000 mg by mouth every 6 (six) hours as needed for moderate pain or headache.   Albuterol Sulfate 108 (90 Base) MCG/ACT Aepb Commonly  known as: PROAIR RESPICLICK Inhale 2 puffs into the lungs daily as needed. What changed: reasons to take this   amLODipine 10 MG tablet Commonly known as: NORVASC Take 1 tablet (10 mg total) by mouth daily.   blood glucose meter kit and supplies by Other route. Check blood sugar 3-4 times   busPIRone 5 MG tablet Commonly known as: BUSPAR Take 1 tablet (5 mg total) by mouth 3 (three) times daily.   carvedilol 12.5 MG tablet Commonly known as: COREG Take 1 tablet (12.5 mg total) by mouth 2 (two) times daily with a meal.   cyanocobalamin 1000 MCG tablet Take 1 tablet (1,000 mcg total) by mouth daily.   cyclobenzaprine 10 MG tablet Commonly known as: FLEXERIL Take 1 tablet (10 mg total) by mouth 3 (three) times daily as needed for muscle spasms.   Dexcom G6 Sensor Misc USE AS DIRECTED EVERY 10 DAYS   Dexcom G6 Transmitter Misc USE AS DIRECTED TO  MONITOR  GLUCOSE  LEVELS   dicyclomine 20 MG tablet Commonly known as: BENTYL Take 1 tablet (20 mg total) by mouth every 8 (eight) hours as needed for spasms.   EPINEPHrine 0.3 mg/0.3 mL Soaj injection Commonly known as: EPI-PEN Inject 0.3 mg into the muscle once as needed (anaphylaxis/allergic reaction). What changed: reasons to take this   ferrous sulfate 325 (65 FE) MG tablet Take 1 tablet (325 mg total) by mouth 2 (two) times daily with a meal.   folic acid 1 MG tablet Commonly known as: FOLVITE Take 1 tablet (  1 mg total) by mouth daily.   gabapentin 100 MG capsule Commonly known as: NEURONTIN Take 200 mg by mouth 3 (three) times daily.   GenTeal 0.25-0.3 % Gel Generic drug: Carboxymethylcell-Hypromellose Place 1 drop into both eyes daily as needed (dryeyes).   hydrALAZINE 50 MG tablet Commonly known as: APRESOLINE Take 50 mg by mouth 4 (four) times daily.   insulin glargine 100 UNIT/ML injection Commonly known as: LANTUS Inject 0.05 mLs (5 Units total) into the skin daily.   insulin lispro 100 UNIT/ML  injection Commonly known as: HUMALOG Inject 0-5 Units into the skin 3 (three) times daily before meals. Sliding scale   Ipratropium-Albuterol 20-100 MCG/ACT Aers respimat Commonly known as: COMBIVENT Inhale 1 puff into the lungs every 6 (six) hours as needed for wheezing or shortness of breath.   levETIRAcetam 500 MG tablet Commonly known as: KEPPRA Take 1 tablet (500 mg total) by mouth 2 (two) times daily.   loperamide 2 MG capsule Commonly known as: IMODIUM Take 6 mg by mouth daily as needed for diarrhea or loose stools.   LORazepam 0.5 MG tablet Commonly known as: ATIVAN Take 1 tablet (0.5 mg total) by mouth 2 (two) times daily.   metoCLOPramide 5 MG tablet Commonly known as: REGLAN Take 1 tablet (5 mg total) by mouth 3 (three) times daily before meals. Please make and keep an appointment for further refills.   ondansetron 4 MG disintegrating tablet Commonly known as: Zofran ODT Take 1 tablet (4 mg total) by mouth every 8 (eight) hours as needed for nausea or vomiting.   pantoprazole 40 MG tablet Commonly known as: PROTONIX Take 1 tablet (40 mg total) by mouth daily.   PenTips 32G X 4 MM Misc Generic drug: Insulin Pen Needle USE AS DIRECTED WITH INSULIN PENS   traZODone 50 MG tablet Commonly known as: DESYREL Take 0.5-1 tablets (25-50 mg total) by mouth at bedtime as needed for sleep.   zinc sulfate 220 (50 Zn) MG capsule Take 1 capsule (220 mg total) by mouth daily.               Discharge Care Instructions  (From admission, onward)           Start     Ordered   12/16/20 0000  Discharge wound care:       Comments: Daily - keep L foot wounds clean and dry at times, continue previous wound care.   12/16/20 5427             Follow-up Information     Newt Minion, MD Follow up in 1 week(s).   Specialty: Orthopedic Surgery Contact information: [place] [address] [phone]         [facility].   Specialty: Rehabilitation Why: Call on Monday to schedule an appointment. A referral has been made electronically for you. Contact information: [address] [id number] mc [place]        [name], MD. Schedule an appointment as soon as possible for a visit in 1 week(s).   Specialty: Family Medicine Contact information: [address] [place] [address] [phone]                 Major procedures and Radiology Reports - PLEASE review detailed and final reports thoroughly  -      CT ABDOMEN PELVIS WO CONTRAST  Result Date: 12/03/2020 CLINICAL DATA:  Seizure, pulled from car, postictal, confusion.  Tachycardia. Aortic disease, nontraumatic; Abdominal pain, acute, nonlocalized EXAM: CT CHEST, ABDOMEN AND PELVIS WITHOUT CONTRAST TECHNIQUE: Multidetector CT imaging of the chest, abdomen and pelvis was performed following the standard protocol without IV contrast. COMPARISON:  11/12/2020 unenhanced CT abdomen/pelvis. 06/04/2020 chest CT angiogram. FINDINGS: CT CHEST FINDINGS Cardiovascular: Top-normal heart size. No significant pericardial effusion/thickening. Right PICC terminates at the cavoatrial junction. Top-normal caliber main pulmonary artery (3.4 cm diameter). Mediastinum/Nodes: No discrete thyroid nodules. Unremarkable esophagus. Enteric tube terminates in the proximal stomach. No pathologically enlarged axillary, mediastinal or hilar lymph nodes, noting limited sensitivity for the detection of hilar adenopathy on this noncontrast study. Lungs/Pleura: No pneumothorax. No pleural effusion. Endotracheal tube tip is 2.2 cm above the carina. Mild to moderate patchy consolidation and ground-glass opacity throughout the dependent lower lobes bilaterally. Mild patchy ground-glass opacity in the left upper lobe. No lung masses or significant pulmonary nodules. Musculoskeletal: No aggressive appearing  focal osseous lesions. Nondisplaced healing subacute anterior left fourth rib fracture. No acute fractures. Stable sclerotic T6 bone island. Moderate anasarca. CT ABDOMEN PELVIS FINDINGS Hepatobiliary: Normal liver with no liver mass. Cholecystectomy. No biliary ductal dilatation. Pancreas: Normal, with no mass or duct dilation. Spleen: Normal size. No mass. Adrenals/Urinary Tract: Normal adrenals. No renal stones. No hydronephrosis. No contour deforming renal masses. Normal bladder. Stomach/Bowel: Normal non-distended stomach. Normal caliber small bowel with no small bowel wall thickening. Appendix not discretely visualized. Collapsed large bowel with no diverticulosis, definite large bowel wall thickening or pericolonic fat stranding. Vascular/Lymphatic: Normal caliber abdominal aorta. No pathologically enlarged lymph nodes in the abdomen or pelvis. Reproductive: Grossly normal uterus.  No adnexal mass. Other: No pneumoperitoneum. Small volume ascites. No focal fluid collections. Moderate anasarca. Musculoskeletal: No aggressive appearing focal osseous lesions. IMPRESSION: 1. Moderate anasarca. Small volume ascites. 2. Mild to moderate patchy consolidation and ground-glass opacity throughout the dependent lower lobes bilaterally. Mild patchy ground-glass opacity in the left upper lobe. Suspect a combination of aspiration and atelectasis. 3. Well positioned endotracheal and enteric tubes. 4. Nondisplaced healing subacute anterior left fourth rib fracture. No acute traumatic injury in the chest, abdomen and pelvis on this noncontrast scan. Electronically Signed   By: Ilona Sorrel M.D.   On: 12/03/2020 06:28   DG Chest 2 View  Result Date: 11/19/2020 CLINICAL DATA:  Shortness of breath.  Facial swelling. EXAM: CHEST - 2 VIEW COMPARISON:  Chest x-ray dated November 12, 2020. FINDINGS: Unchanged right upper extremity PICC line. Normal heart size. New trace bilateral pleural effusions. New left perihilar and bibasilar  opacities. No pneumothorax. No acute osseous abnormality. IMPRESSION: 1. New left perihilar opacity concerning for pneumonia. 2. New trace bilateral pleural effusions with bibasilar atelectasis versus infiltrate. Electronically Signed   By: Titus Dubin M.D.   On: 11/19/2020 16:14   CT HEAD WO CONTRAST (5MM)  Result Date: 12/03/2020 CLINICAL DATA:  Seizure EXAM: CT HEAD WITHOUT CONTRAST TECHNIQUE: Contiguous axial images were obtained from the base of the skull through the vertex without intravenous contrast. COMPARISON:  09/25/2020 FINDINGS: Brain: No evidence of acute infarction, hemorrhage, hydrocephalus, extra-axial collection or mass lesion/mass effect. Vascular: No hyperdense vessel or unexpected calcification. Skull: Normal. Negative for fracture or focal lesion. Sinuses/Orbits: No acute finding. IMPRESSION: Stable and negative head CT. Electronically Signed   By: Monte Fantasia M.D.   On: 12/03/2020 06:17   CT CHEST WO CONTRAST  Result Date: 12/03/2020 CLINICAL DATA:  Seizure, pulled from car, postictal, confusion. Tachycardia. Aortic disease, nontraumatic; Abdominal pain, acute, nonlocalized EXAM: CT CHEST,  ABDOMEN AND PELVIS WITHOUT CONTRAST TECHNIQUE: Multidetector CT imaging of the chest, abdomen and pelvis was performed following the standard protocol without IV contrast. COMPARISON:  11/12/2020 unenhanced CT abdomen/pelvis. 06/04/2020 chest CT angiogram. FINDINGS: CT CHEST FINDINGS Cardiovascular: Top-normal heart size. No significant pericardial effusion/thickening. Right PICC terminates at the cavoatrial junction. Top-normal caliber main pulmonary artery (3.4 cm diameter). Mediastinum/Nodes: No discrete thyroid nodules. Unremarkable esophagus. Enteric tube terminates in the proximal stomach. No pathologically enlarged axillary, mediastinal or hilar lymph nodes, noting limited sensitivity for the detection of hilar adenopathy on this noncontrast study. Lungs/Pleura: No pneumothorax. No  pleural effusion. Endotracheal tube tip is 2.2 cm above the carina. Mild to moderate patchy consolidation and ground-glass opacity throughout the dependent lower lobes bilaterally. Mild patchy ground-glass opacity in the left upper lobe. No lung masses or significant pulmonary nodules. Musculoskeletal: No aggressive appearing focal osseous lesions. Nondisplaced healing subacute anterior left fourth rib fracture. No acute fractures. Stable sclerotic T6 bone island. Moderate anasarca. CT ABDOMEN PELVIS FINDINGS Hepatobiliary: Normal liver with no liver mass. Cholecystectomy. No biliary ductal dilatation. Pancreas: Normal, with no mass or duct dilation. Spleen: Normal size. No mass. Adrenals/Urinary Tract: Normal adrenals. No renal stones. No hydronephrosis. No contour deforming renal masses. Normal bladder. Stomach/Bowel: Normal non-distended stomach. Normal caliber small bowel with no small bowel wall thickening. Appendix not discretely visualized. Collapsed large bowel with no diverticulosis, definite large bowel wall thickening or pericolonic fat stranding. Vascular/Lymphatic: Normal caliber abdominal aorta. No pathologically enlarged lymph nodes in the abdomen or pelvis. Reproductive: Grossly normal uterus.  No adnexal mass. Other: No pneumoperitoneum. Small volume ascites. No focal fluid collections. Moderate anasarca. Musculoskeletal: No aggressive appearing focal osseous lesions. IMPRESSION: 1. Moderate anasarca. Small volume ascites. 2. Mild to moderate patchy consolidation and ground-glass opacity throughout the dependent lower lobes bilaterally. Mild patchy ground-glass opacity in the left upper lobe. Suspect a combination of aspiration and atelectasis. 3. Well positioned endotracheal and enteric tubes. 4. Nondisplaced healing subacute anterior left fourth rib fracture. No acute traumatic injury in the chest, abdomen and pelvis on this noncontrast scan. Electronically Signed   By: Ilona Sorrel M.D.   On:  12/03/2020 06:28   MR BRAIN WO CONTRAST  Result Date: 12/03/2020 CLINICAL DATA:  Seizure, abnormal neuro exam. EXAM: MRI HEAD WITHOUT CONTRAST TECHNIQUE: Multiplanar, multiecho pulse sequences of the brain and surrounding structures were obtained without intravenous contrast. COMPARISON:  Prior head CT examinations 12/03/2020 and earlier. FINDINGS: Brain: Cerebral volume is normal for age. There is fairly symmetric patchy cortical/subcortical T2/FLAIR hyperintense signal abnormality within the bilateral frontal lobes, parietooccipital lobes and posterior temporal lobes. Additionally, there are patchy foci of T2/FLAIR hyperintense signal abnormality within the posterior limbs of internal capsules and bilateral basal ganglia bilaterally. 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 Alternative etiologies (including cerebritis) can not be excluded. The hippocampi are symmetric in size and signal. There is no acute infarct. No evidence of an intracranial mass. No chronic intracranial blood products. No extra-axial fluid collection. No midline shift. Vascular: Maintained flow voids within the proximal large arterial vessels. Skull and upper cervical spine: No focal suspicious marrow lesion. Sinuses/Orbits: Visualized orbits show no acute finding. Trace bilateral ethmoid sinus mucosal thickening. Other: Posterior scalp edema bilaterally. These results will be called to the ordering clinician or representative by the Radiologist Assistant, and communication documented in the PACS or [employer]. IMPRESSION: Fairly symmetric patchy cortical/subcortical T2/FLAIR hyperintense signal abnormality within the bilateral frontal lobes, parietooccipital lobes and posterior  temporal lobes. Additional patchy foci of T2/FLAIR hyperintense signal abnormality within the posterior limbs of the internal capsules and bilateral basal ganglia bilaterally. 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 Alternative etiologies (including cerebritis) can not be excluded. Clinical correlation is recommended. Additionally, a follow-up brain MRI is recommended to assess for stability, resolution or progression of these signal changes. Electronically Signed   By: Kellie Simmering D.O.   On: 12/03/2020 16:31   NM Pulmonary Perfusion  Result Date: 12/10/2020 CLINICAL DATA:  PE suspected, low/intermediate prob, positive D-dimer, COVID+ with D-Dimer >20.00 EXAM: NUCLEAR MEDICINE PERFUSION LUNG SCAN TECHNIQUE: Perfusion images were obtained in multiple projections after intravenous injection of radiopharmaceutical. Ventilation scans intentionally deferred if perfusion scan and chest x-ray adequate for interpretation during COVID 19 epidemic. RADIOPHARMACEUTICALS:  4.0 mCi Tc-53mMAA IV COMPARISON:  None. FINDINGS: There is a normal distribution of radiotracer throughout the lungs bilaterally. No segmental perfusion defects are identified to suggest acute pulmonary embolism. IMPRESSION: Normal examination.  No pulmonary embolism. Electronically Signed   By: AFidela SalisburyM.D.   On: 12/10/2020 16:37   [place]RENAL  Result Date: 12/05/2020 CLINICAL DATA:  Acute kidney injury. EXAM: RENAL / URINARY TRACT ULTRASOUND COMPLETE COMPARISON:  Noncontrast CT 12/03/2020.  Renal ultrasound 02/16/2020 FINDINGS: Right Kidney: Renal measurements: 12.2 x 6.0 x 5.5 cm = volume: 207 mL. Diffusely increased renal parenchymal echogenicity. No hydronephrosis. No evidence of focal lesion or stone. Left Kidney: Renal measurements: 12.7 x 5.2 x 4.6 cm = volume: 158 mL. Diffusely increased renal parenchymal echogenicity. No hydronephrosis. No evidence of focal lesion or stone. Bladder: Decompressed by Foley catheter. Bladder wall may be thickened despite nondistention. Other: Small volume abdominopelvic ascites, as seen on recent CT. IMPRESSION: 1. No  hydronephrosis or obstructive uropathy. 2. Mild increased renal parenchymal echogenicity consistent with chronic medical renal disease. 3. Question bladder wall thickening versus nondistention. 4. Small volume ascites as seen on recent CT. Electronically Signed   By: MKeith RakeM.D.   On: 12/05/2020 23:27   DG Chest Portable 1 View  Result Date: 12/12/2020 CLINICAL DATA:  Found unresponsive. EXAM: PORTABLE CHEST 1 VIEW COMPARISON:  December 10, 2020 FINDINGS: The right-sided PICC line seen on the prior study has been removed. Mild to moderate severity diffusely increased interstitial lung markings are seen with prominence of the perihilar pulmonary vasculature. Mild areas of atelectasis and/or infiltrate are also noted within the bilateral infrahilar regions. There is no evidence of a pleural effusion or pneumothorax. The cardiac silhouette is mildly increased in size. The visualized skeletal structures are unremarkable. IMPRESSION: Mild to moderate severity pulmonary vascular congestion with mild bilateral infrahilar atelectasis and/or infiltrate. Electronically Signed   By: TVirgina [place]   On: 12/12/2020 23:11   DG CHEST PORT 1 VIEW  Result Date: 12/10/2020 CLINICAL DATA:  Shortness of breath, COVID. EXAM: PORTABLE CHEST 1 VIEW COMPARISON:  12/09/2020 and CT chest 06/04/2020. FINDINGS: Trachea is midline. Heart size is accentuated by AP technique. Right PICC tip is in the low SVC. Minimal streaky opacification in the left lung base. Lungs are otherwise clear. No pleural fluid. IMPRESSION: Minimal streaky atelectasis in the left lung base. Electronically Signed   By: MLorin PicketM.D.   On: 12/10/2020 09:21   DG CHEST PORT 1 VIEW  Result Date: 12/09/2020 CLINICAL DATA:  Shortness of breath. EXAM: PORTABLE CHEST 1 VIEW COMPARISON:  December 08, 2020 FINDINGS: Right PICC line in stable position. Borderline enlarged cardiac  silhouette. Mediastinal contours appear intact. There is no  evidence of pleural effusion or pneumothorax. Mild peribronchial airspace consolidation, left greater than right lung base. Osseous structures are without acute abnormality. Soft tissues are grossly normal. IMPRESSION: Mild peribronchial airspace consolidation, left greater than right lung base may represent atypical pneumonia. Electronically Signed   By: Fidela Salisbury M.D.   On: 12/09/2020 10:47   DG CHEST PORT 1 VIEW  Result Date: 12/08/2020 CLINICAL DATA:  Back pain. Altered mental status. COVID-19 positive. EXAM: PORTABLE CHEST 1 VIEW COMPARISON:  12/03/2020 FINDINGS: Right-sided PICC line tip at superior caval/atrial junction. Interval extubation. Midline trachea. Borderline cardiomegaly. No pleural effusion or pneumothorax. Left greater than right interstitial coarsening without well-defined lobar consolidation. IMPRESSION: Bilateral interstitial coarsening is favored to be chronic. Especially in the left upper lobe, mild COVID-19 pneumonia could look similar. Electronically Signed   By: Abigail Miyamoto M.D.   On: 12/08/2020 10:32   DG Chest Port 1 View  Result Date: 12/03/2020 CLINICAL DATA:  Intubated, seizure EXAM: PORTABLE CHEST 1 VIEW COMPARISON:  11/19/2020 chest radiograph. FINDINGS: Endotracheal tube tip is 4.3 cm above the carina. Enteric tube enters stomach with the tip not seen on this image. Right PICC terminates at the cavoatrial junction. Stable cardiomediastinal silhouette with top-normal heart size. No pneumothorax. No pleural effusion. Mild hazy patchy bibasilar lung opacities. No pulmonary edema. IMPRESSION: 1. Well-positioned support structures. 2. Mild hazy patchy bibasilar lung opacities, suspect atelectasis or aspiration. Electronically Signed   By: Ilona Sorrel M.D.   On: 12/03/2020 06:30   EEG adult  Result Date: 12/03/2020 Lora Havens, MD     12/03/2020  5:56 PM Patient Name: TABBITHA JANVRIN MRN: [id number] Epilepsy Attending: Lora Havens Referring  Physician/Provider: Dr Kerney Elbe Date: 12/03/2020 Duration: 22.05 mins Patient history: 30yo F with ams. EEG to evaluate for seizure. Level of alertness: comatose AEDs during EEG study: GBP, LEV, propofol, Ativan Technical aspects: This EEG study was done with scalp electrodes positioned according to the 10-20 International system of electrode placement. Electrical activity was acquired at a sampling rate of 500Hz and reviewed with a high frequency filter of 70Hz and a low frequency filter of 1Hz. EEG data were recorded continuously and digitally stored. Description:  EEG showed continuous generalized 2-3Hz delta slowing admixed with an excessive amount of 15 to 18 Hz beta activity with irregular morphology distributed symmetrically and diffusely. Hyperventilation and photic stimulation were not performed.    ABNORMALITY - Continuous slow, generalized - Excessive beta, generalized  IMPRESSION: This study is suggestive of severe diffuse encephalopathy, nonspecific etiology but likely related to sedation. No seizures or epileptiform discharges were seen throughout the recording.  Dr Cheral Marker was notified.  Priyanka Barbra Sarks   Overnight EEG with video  Result Date: 12/04/2020 Lora Havens, MD     12/05/2020  9:02 AM Patient Name: NATASSIA GUTHRIDGE MRN: [id number] Epilepsy Attending: Lora Havens Referring Physician/Provider: Dr Kerney Elbe Duration: 12/03/2020 1751 to 12/04/2020 1751  Patient history: 30yo F with ams. EEG to evaluate for seizure.  Level of alertness: comatose  AEDs during EEG study: GBP, LEV, propofol  Technical aspects: This EEG study was done with scalp electrodes positioned according to the 10-20 International system of electrode placement. Electrical activity was acquired at a sampling rate of 500Hz and reviewed with a high frequency filter of 70Hz and a low frequency filter of 1Hz. EEG data were recorded continuously and digitally stored.  Description:  EEG showed continuous generalized  2-3Hz  delta slowing admixed with an excessive amount of 15 to 18 Hz beta activity with irregular morphology distributed symmetrically and diffusely. Hyperventilation and photic stimulation were not performed.    ABNORMALITY - Continuous slow, generalized - Excessive beta, generalized  IMPRESSION: This study is suggestive of severe diffuse encephalopathy, nonspecific etiology but likely related to sedation. No seizures or epileptiform discharges were seen throughout the recording.  Priyanka O Yadav    VAS [place] LOWER EXTREMITY VENOUS (DVT)  Result Date: 12/10/2020  Lower Venous DVT Study Patient Name:  HAIZEL GATCHELL  Date of Exam:   12/09/2020 Medical Rec #: [id number]       Accession #:    [id number] Date of Birth: [birth year]       Patient Gender: F Patient Age:   58 years Exam Location:  [facility] Procedure:      VAS [place] LOWER EXTREMITY VENOUS (DVT) Referring Phys: Raiford Noble --------------------------------------------------------------------------------  Indications: Covid, D-Dimer >20, edema.  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 Comparison Study: Prior negative bilateral LEV done 05/17/2020 Performing Technologist: Sharion Dove RVS  Examination Guidelines: A complete evaluation includes B-mode imaging, spectral Doppler, color Doppler, and power Doppler as needed of all accessible portions of each vessel. Bilateral testing is considered an integral part of a complete examination. Limited examinations for reoccurring indications may be performed as noted. The reflux portion of the exam is performed with the patient in reverse Trendelenburg.  +---------+---------------+---------+-----------+----------+-------------------+ RIGHT    CompressibilityPhasicitySpontaneityPropertiesThrombus Aging      +---------+---------------+---------+-----------+----------+-------------------+ CFV      Full                                         pulsatile waveforms  +---------+---------------+---------+-----------+----------+-------------------+ SFJ      Full                                                             +---------+---------------+---------+-----------+----------+-------------------+ FV Prox  Full                                                             +---------+---------------+---------+-----------+----------+-------------------+ FV Mid   Full                                                             +---------+---------------+---------+-----------+----------+-------------------+ FV DistalFull                                                             +---------+---------------+---------+-----------+----------+-------------------+ PFV      Full                                                             +---------+---------------+---------+-----------+----------+-------------------+  POP      Full           Yes      Yes                                      +---------+---------------+---------+-----------+----------+-------------------+ PTV      Full                                                             +---------+---------------+---------+-----------+----------+-------------------+ PERO     Full                                                             +---------+---------------+---------+-----------+----------+-------------------+   +---------+---------------+---------+-----------+----------+-------------------+ LEFT     CompressibilityPhasicitySpontaneityPropertiesThrombus Aging      +---------+---------------+---------+-----------+----------+-------------------+ CFV      Full                                         pulsatile waveforms +---------+---------------+---------+-----------+----------+-------------------+ SFJ      Full                                                             +---------+---------------+---------+-----------+----------+-------------------+ FV  Prox  Full                                                             +---------+---------------+---------+-----------+----------+-------------------+ FV Mid   Full                                                             +---------+---------------+---------+-----------+----------+-------------------+ FV DistalFull                                                             +---------+---------------+---------+-----------+----------+-------------------+ PFV      Full                                                             +---------+---------------+---------+-----------+----------+-------------------+ POP  Full           Yes      Yes                                      +---------+---------------+---------+-----------+----------+-------------------+ PTV      Full                                                             +---------+---------------+---------+-----------+----------+-------------------+ PERO     Full                                                             +---------+---------------+---------+-----------+----------+-------------------+     Summary: BILATERAL: - No evidence of deep vein thrombosis seen in the lower extremities, bilaterally. - RIGHT: - A cystic structure is found in the popliteal fossa. interstitial edema noted throughout  LEFT: - A cystic structure is found in the popliteal fossa. Interstitial edema noted throughout.  *See table(s) above for measurements and observations. Electronically signed by Servando Snare MD on 12/10/2020 at 11:08:32 PM.    Final    ECHOCARDIOGRAM LIMITED  Result Date: 12/13/2020    ECHOCARDIOGRAM LIMITED REPORT   Patient Name:   DARI CARPENITO Date of Exam: 12/13/2020 Medical Rec #:  [id number]      Height:       67.0 in Accession #:    [id number]     Weight:       160.0 lb Date of Birth:  05-Jan-1991      BSA:          1.839 m Patient Age:    30 years       BP:           107/75 mmHg Patient Gender: F               HR:           73 bpm. Exam Location:  Inpatient Procedure: Limited Echo, Cardiac Doppler and Color Doppler Indications:    Cardiomyopathy  History:        Patient has prior history of Echocardiogram examinations, most                 recent 05/16/2020. Signs/Symptoms:Altered Mental Status; Risk                 Factors:Hypertension and Diabetes. Covid+, CKD.  Sonographer:    Dustin Flock RDCS Referring Phys: [phone] Margaree Mackintosh Yoder  1. Left ventricular ejection fraction, by estimation, is 65 to 70%. The left ventricle has normal function. The left ventricle has no regional wall motion abnormalities. There is mild left ventricular hypertrophy.  2. Right ventricular systolic function is low normal. The right ventricular size is normal. There is normal pulmonary artery systolic pressure.  3. Mild mitral valve regurgitation.  4. Tricuspid valve regurgitation is moderate.  5. The aortic valve is normal in structure. Aortic valve regurgitation is not visualized.  6. The inferior vena cava is dilated in size with <50% respiratory variability, suggesting right  atrial pressure of 15 mmHg. FINDINGS  Left Ventricle: Left ventricular ejection fraction, by estimation, is 65 to 70%. The left ventricle has normal function. The left ventricle has no regional wall motion abnormalities. The left ventricular internal cavity size was normal in size. There is  mild left ventricular hypertrophy. Right Ventricle: The right ventricular size is normal. Right ventricular systolic function is low normal. There is normal pulmonary artery systolic pressure. The tricuspid regurgitant velocity is 2.69 m/s, and with an assumed right atrial pressure of 3 mmHg, the estimated right ventricular systolic pressure is 83.3 mmHg. Right Atrium: Right atrial size was normal in size. Pericardium: Trivial pericardial effusion is present. Mitral Valve: There is mild thickening of the mitral valve leaflet(s). Mild mitral annular  calcification. Mild mitral valve regurgitation. Tricuspid Valve: Tricuspid valve regurgitation is moderate. Aortic Valve: The aortic valve is normal in structure. Aortic valve regurgitation is not visualized. Aortic valve peak gradient measures 11.3 mmHg. Pulmonic Valve: The pulmonic valve was normal in structure. Pulmonic valve regurgitation is trivial. Aorta: The aortic root and ascending aorta are structurally normal, with no evidence of dilitation. Venous: The inferior vena cava is dilated in size with less than 50% respiratory variability, suggesting right atrial pressure of 15 mmHg. IAS/Shunts: No atrial level shunt detected by color flow Doppler. LEFT VENTRICLE PLAX 2D LVIDd:         4.80 cm      Diastology LVIDs:         3.10 cm      LV e' medial:    7.72 cm/s LV PW:         1.10 cm      LV E/e' medial:  14.2 LV IVS:        1.20 cm      LV e' lateral:   8.70 cm/s LVOT diam:     2.00 cm      LV E/e' lateral: 12.6 LVOT Area:     3.14 cm  LV Volumes (MOD) LV vol d, MOD A4C: 116.0 ml LV vol s, MOD A4C: 41.5 ml LV SV MOD A4C:     116.0 ml LEFT ATRIUM         Index LA diam:    4.50 cm 2.45 cm/m  AORTIC VALVE AV Vmax:      168.00 cm/s AV Peak Grad: 11.3 mmHg  AORTA Ao Root diam: 2.50 cm MITRAL VALVE                TRICUSPID VALVE MV Area (PHT): 3.74 cm     TR Peak grad:   28.9 mmHg MV Decel Time: 203 msec     TR Vmax:        269.00 cm/s MV E velocity: 110.00 cm/s MV A velocity: 86.60 cm/s   SHUNTS MV E/A ratio:  1.27         Systemic Diam: 2.00 cm Dorris Carnes MD Electronically signed by Dorris Carnes MD Signature Date/Time: 12/13/2020/4:34:47 PM    Final       Today   Subjective    [name] today has no headache,no chest abdominal pain,no new weakness tingling or numbness, feels much better wants to go home today.     Objective   Blood pressure 165/90, pulse 90, temperature 98.3 F (36.8 C), temperature source Oral, resp. rate 14, height 5' 7" (1.702 m), weight 72.6 kg, SpO2 96 %.   Intake/Output  Summary (Last 24 hours) at 12/16/2020 0951 Last data filed at 12/15/2020 2229 Gross  per 24 hour  Intake 240 ml  Output --  Net 240 ml    Exam  Awake Alert, No new F.N deficits, Normal affect [place].AT,PERRAL Supple Neck,No JVD, No cervical lymphadenopathy appriciated.  Symmetrical Chest wall movement, Good air movement bilaterally, CTAB RRR,No Gallops,Rubs or new Murmurs, No Parasternal Heave +ve B.Sounds, Abd Soft, Non tender, No organomegaly appriciated, No rebound -guarding or rigidity. Right BKA stump clean, left foot big toe amputated, few chronic sores POA under bandage   Data Review   CBC w Diff:  Lab Results  Component Value Date   WBC 6.2 12/16/2020   HGB 9.2 (L) 12/16/2020   HCT 29.7 (L) 12/16/2020   PLT 304 12/16/2020   LYMPHOPCT 16 12/16/2020   MONOPCT 8 12/16/2020   EOSPCT 7 12/16/2020   BASOPCT 1 12/16/2020    CMP:  Lab Results  Component Value Date   NA 135 12/16/2020   NA 135 (A) 11/02/2020   K 4.7 12/16/2020   CL 109 12/16/2020   CO2 16 (L) 12/16/2020   BUN 49 (H) 12/16/2020   BUN 25 (A) 11/02/2020   CREATININE 1.73 (H) 12/16/2020   CREATININE 0.48 (L) 11/07/2014   GLU 186 11/02/2020   PROT 7.1 12/16/2020   ALBUMIN 2.3 (L) 12/16/2020   BILITOT 0.5 12/16/2020   ALKPHOS 174 (H) 12/16/2020   AST 24 12/16/2020   ALT 36 12/16/2020  .   Total Time in preparing paper work, data evaluation and todays exam - 47 minutes  Lala Lund M.D on 12/16/2020 at 9:51 AM  [facility]

## 2020-12-16 NOTE — Discharge Instructions (Signed)
Follow with Primary MD [name], MD in 7 days   Get CBC, CMP, 2 view Chest X ray -  checked next visit within 1 week by Primary MD    Activity: As tolerated with Full fall precautions use walker/cane & assistance as needed  Disposition Home    Diet: Heart Healthy Low Carb  Accuchecks 4 times/day, Once in AM empty stomach and then before each meal. Log in all results and show them to your Prim.MD in 3 days. If any glucose reading is under 80 or above 300 call your Prim MD immidiately. Follow Low glucose instructions for glucose under 80 as instructed.   Special Instructions: If you have smoked or chewed Tobacco  in the last 2 yrs please stop smoking, stop any regular Alcohol  and or any Recreational drug use.  On your next visit with your primary care physician please Get Medicines reviewed and adjusted.  Please request your Prim.MD to go over all Hospital Tests and Procedure/Radiological results at the follow up, please get all Hospital records sent to your Prim MD by signing hospital release before you go home.  If you experience worsening of your admission symptoms, develop shortness of breath, life threatening emergency, suicidal or homicidal thoughts you must seek medical attention immediately by calling 911 or calling your MD immediately  if symptoms less severe.  You Must read complete instructions/literature along with all the possible adverse reactions/side effects for all the Medicines you take and that have been prescribed to you. Take any new Medicines after you have completely understood and accpet all the possible adverse reactions/side effects.

## 2020-12-17 ENCOUNTER — Telehealth: Payer: Self-pay

## 2020-12-17 ENCOUNTER — Ambulatory Visit: Payer: Medicaid Other | Admitting: Physician Assistant

## 2020-12-17 NOTE — Telephone Encounter (Cosign Needed)
Transition Care Management Follow-up Telephone Call Date of discharge and from where: [facility] 12/16/20 How have you been since you were released from the hospital? Doing ok Any questions or concerns? No  Items Reviewed: Did the pt receive and understand the discharge instructions provided? Yes  Medications obtained and verified? Yes  Other? No  Any new allergies since your discharge? No  Dietary orders reviewed? Yes Do you have support at home? Yes   Home Care and Equipment/Supplies: Were home health services ordered? not applicable If so, what is the name of the agency?   Has the agency set up a time to come to the patient's home? not applicable Were any new equipment or medical supplies ordered?  No What is the name of the medical supply agency?  Were you able to get the supplies/equipment? not applicable Do you have any questions related to the use of the equipment or supplies? No  Functional Questionnaire: (I = Independent and D = Dependent) ADLs: I  Bathing/Dressing- I  Meal Prep- D  Eating- I  Maintaining continence- I  Transferring/Ambulation- D  Managing Meds- I PT STATED FOR THE MOST PART SHE DOES HERSELF , HOWEVER HUSBAND IS THERE TO ASSIST   Follow up appointments reviewed:  PCP Hospital f/u appt confirmed? Yes  Scheduled to see Dr Jerline Pain  on 12/26/20 @ 10:40a.m. [facility] f/u appt confirmed? No will schedule with Dr [name] based on husband schedule  Are transportation arrangements needed? No  If their condition worsens, is the pt aware to call PCP or go to the Emergency Dept.? Yes Was the patient provided with contact information for the PCP's office or ED? Yes Was to pt encouraged to call back with questions or concerns? Yes

## 2020-12-17 NOTE — Telephone Encounter (Signed)
Transition Care Management Unsuccessful Follow-up Telephone Call  Date of discharge and from where:  12/16/2020-[facility]   Attempts:  1st Attempt  Reason for unsuccessful TCM follow-up call:  Left voice message

## 2020-12-18 ENCOUNTER — Inpatient Hospital Stay: Payer: Medicaid Other | Admitting: Family Medicine

## 2020-12-18 LAB — CULTURE, BLOOD (ROUTINE X 2): Culture: NO GROWTH

## 2020-12-18 NOTE — Telephone Encounter (Signed)
Transition Care Management Unsuccessful Follow-up Telephone Call  Date of discharge and from where:  12/16/2020-[facility]  Attempts:  2nd Attempt  Reason for unsuccessful TCM follow-up call:  Left voice message

## 2020-12-20 ENCOUNTER — Emergency Department (HOSPITAL_COMMUNITY): Payer: Medicaid Other

## 2020-12-20 ENCOUNTER — Encounter (HOSPITAL_COMMUNITY): Payer: Self-pay

## 2020-12-20 ENCOUNTER — Observation Stay (HOSPITAL_COMMUNITY)
Admission: EM | Admit: 2020-12-20 | Discharge: 2020-12-21 | Disposition: A | Discharge status: Home / Self Care | Payer: Medicaid Other | Attending: Internal Medicine | Admitting: Internal Medicine

## 2020-12-20 ENCOUNTER — Other Ambulatory Visit: Payer: Self-pay

## 2020-12-20 DIAGNOSIS — Z79899 Other long term (current) drug therapy: Secondary | ICD-10-CM | POA: Diagnosis not present

## 2020-12-20 DIAGNOSIS — I5032 Chronic diastolic (congestive) heart failure: Secondary | ICD-10-CM | POA: Insufficient documentation

## 2020-12-20 DIAGNOSIS — Z794 Long term (current) use of insulin: Secondary | ICD-10-CM | POA: Diagnosis not present

## 2020-12-20 DIAGNOSIS — N183 Chronic kidney disease, stage 3 unspecified: Secondary | ICD-10-CM | POA: Insufficient documentation

## 2020-12-20 DIAGNOSIS — E1022 Type 1 diabetes mellitus with diabetic chronic kidney disease: Secondary | ICD-10-CM | POA: Diagnosis not present

## 2020-12-20 DIAGNOSIS — R112 Nausea with vomiting, unspecified: Principal | ICD-10-CM | POA: Insufficient documentation

## 2020-12-20 DIAGNOSIS — I130 Hypertensive heart and chronic kidney disease with heart failure and stage 1 through stage 4 chronic kidney disease, or unspecified chronic kidney disease: Secondary | ICD-10-CM | POA: Insufficient documentation

## 2020-12-20 DIAGNOSIS — E1043 Type 1 diabetes mellitus with diabetic autonomic (poly)neuropathy: Secondary | ICD-10-CM

## 2020-12-20 DIAGNOSIS — D631 Anemia in chronic kidney disease: Secondary | ICD-10-CM | POA: Insufficient documentation

## 2020-12-20 DIAGNOSIS — R109 Unspecified abdominal pain: Secondary | ICD-10-CM | POA: Insufficient documentation

## 2020-12-20 DIAGNOSIS — Z8616 Personal history of COVID-19: Secondary | ICD-10-CM | POA: Insufficient documentation

## 2020-12-20 DIAGNOSIS — R918 Other nonspecific abnormal finding of lung field: Secondary | ICD-10-CM | POA: Diagnosis not present

## 2020-12-20 DIAGNOSIS — R079 Chest pain, unspecified: Secondary | ICD-10-CM | POA: Diagnosis not present

## 2020-12-20 DIAGNOSIS — R188 Other ascites: Secondary | ICD-10-CM | POA: Diagnosis not present

## 2020-12-20 DIAGNOSIS — N3289 Other specified disorders of bladder: Secondary | ICD-10-CM | POA: Diagnosis not present

## 2020-12-20 DIAGNOSIS — K6389 Other specified diseases of intestine: Secondary | ICD-10-CM | POA: Diagnosis not present

## 2020-12-20 DIAGNOSIS — M5459 Other low back pain: Secondary | ICD-10-CM | POA: Diagnosis present

## 2020-12-20 LAB — CBC WITH DIFFERENTIAL/PLATELET
Abs Immature Granulocytes: 0.03 10*3/uL (ref 0.00–0.07)
Basophils Absolute: 0.1 10*3/uL (ref 0.0–0.1)
Basophils Relative: 1 %
Eosinophils Absolute: 0.1 10*3/uL (ref 0.0–0.5)
Eosinophils Relative: 1 %
HCT: 34.1 % — ABNORMAL LOW (ref 36.0–46.0)
Hemoglobin: 11 g/dL — ABNORMAL LOW (ref 12.0–15.0)
Immature Granulocytes: 0 %
Lymphocytes Relative: 15 %
Lymphs Abs: 1.1 10*3/uL (ref 0.7–4.0)
MCH: 28.7 pg (ref 26.0–34.0)
MCHC: 32.3 g/dL (ref 30.0–36.0)
MCV: 89 fL (ref 80.0–100.0)
Monocytes Absolute: 0.4 10*3/uL (ref 0.1–1.0)
Monocytes Relative: 5 %
Neutro Abs: 5.8 10*3/uL (ref 1.7–7.7)
Neutrophils Relative %: 78 %
Platelets: 203 10*3/uL (ref 150–400)
RBC: 3.83 MIL/uL — ABNORMAL LOW (ref 3.87–5.11)
RDW: 13.7 % (ref 11.5–15.5)
WBC: 7.4 10*3/uL (ref 4.0–10.5)
nRBC: 0 % (ref 0.0–0.2)

## 2020-12-20 LAB — COMPREHENSIVE METABOLIC PANEL
ALT: 20 U/L (ref 0–44)
AST: 23 U/L (ref 15–41)
Albumin: 2.8 g/dL — ABNORMAL LOW (ref 3.5–5.0)
Alkaline Phosphatase: 157 U/L — ABNORMAL HIGH (ref 38–126)
Anion gap: 11 (ref 5–15)
BUN: 23 mg/dL — ABNORMAL HIGH (ref 6–20)
CO2: 21 mmol/L — ABNORMAL LOW (ref 22–32)
Calcium: 9.2 mg/dL (ref 8.9–10.3)
Chloride: 108 mmol/L (ref 98–111)
Creatinine, Ser: 1.28 mg/dL — ABNORMAL HIGH (ref 0.44–1.00)
GFR, Estimated: 57 mL/min — ABNORMAL LOW (ref >60)
Glucose, Bld: 232 mg/dL — ABNORMAL HIGH (ref 70–99)
Potassium: 4.4 mmol/L (ref 3.5–5.1)
Sodium: 140 mmol/L (ref 135–145)
Total Bilirubin: 0.6 mg/dL (ref 0.3–1.2)
Total Protein: 7.9 g/dL (ref 6.5–8.1)

## 2020-12-20 LAB — URINALYSIS, ROUTINE W REFLEX MICROSCOPIC
Bilirubin Urine: NEGATIVE
Glucose, UA: >=500 mg/dL — AB
Ketones, ur: NEGATIVE mg/dL
Leukocytes,Ua: NEGATIVE
Nitrite: NEGATIVE
Protein, ur: >300 mg/dL — AB
Specific Gravity, Urine: 1.02 (ref 1.005–1.030)
pH: 6 (ref 5.0–8.0)

## 2020-12-20 LAB — PREGNANCY, URINE: Preg Test, Ur: NEGATIVE

## 2020-12-20 LAB — RAPID URINE DRUG SCREEN, HOSP PERFORMED
Amphetamines: NOT DETECTED
Barbiturates: NOT DETECTED
Benzodiazepines: NOT DETECTED
Cocaine: NOT DETECTED
Opiates: POSITIVE — AB
Tetrahydrocannabinol: POSITIVE — AB

## 2020-12-20 LAB — CBG MONITORING, ED: Glucose-Capillary: 217 mg/dL — ABNORMAL HIGH (ref 70–99)

## 2020-12-20 LAB — URINALYSIS, MICROSCOPIC (REFLEX): Bacteria, UA: NONE SEEN

## 2020-12-20 IMAGING — CR DG CHEST 2V
2 series · 2 of 2 positions shown · non-contrast
Comparison: Chest x-ray [DATE].

CLINICAL DATA: Pain.

EXAM:
CHEST - 2 VIEW

[w chest lat]
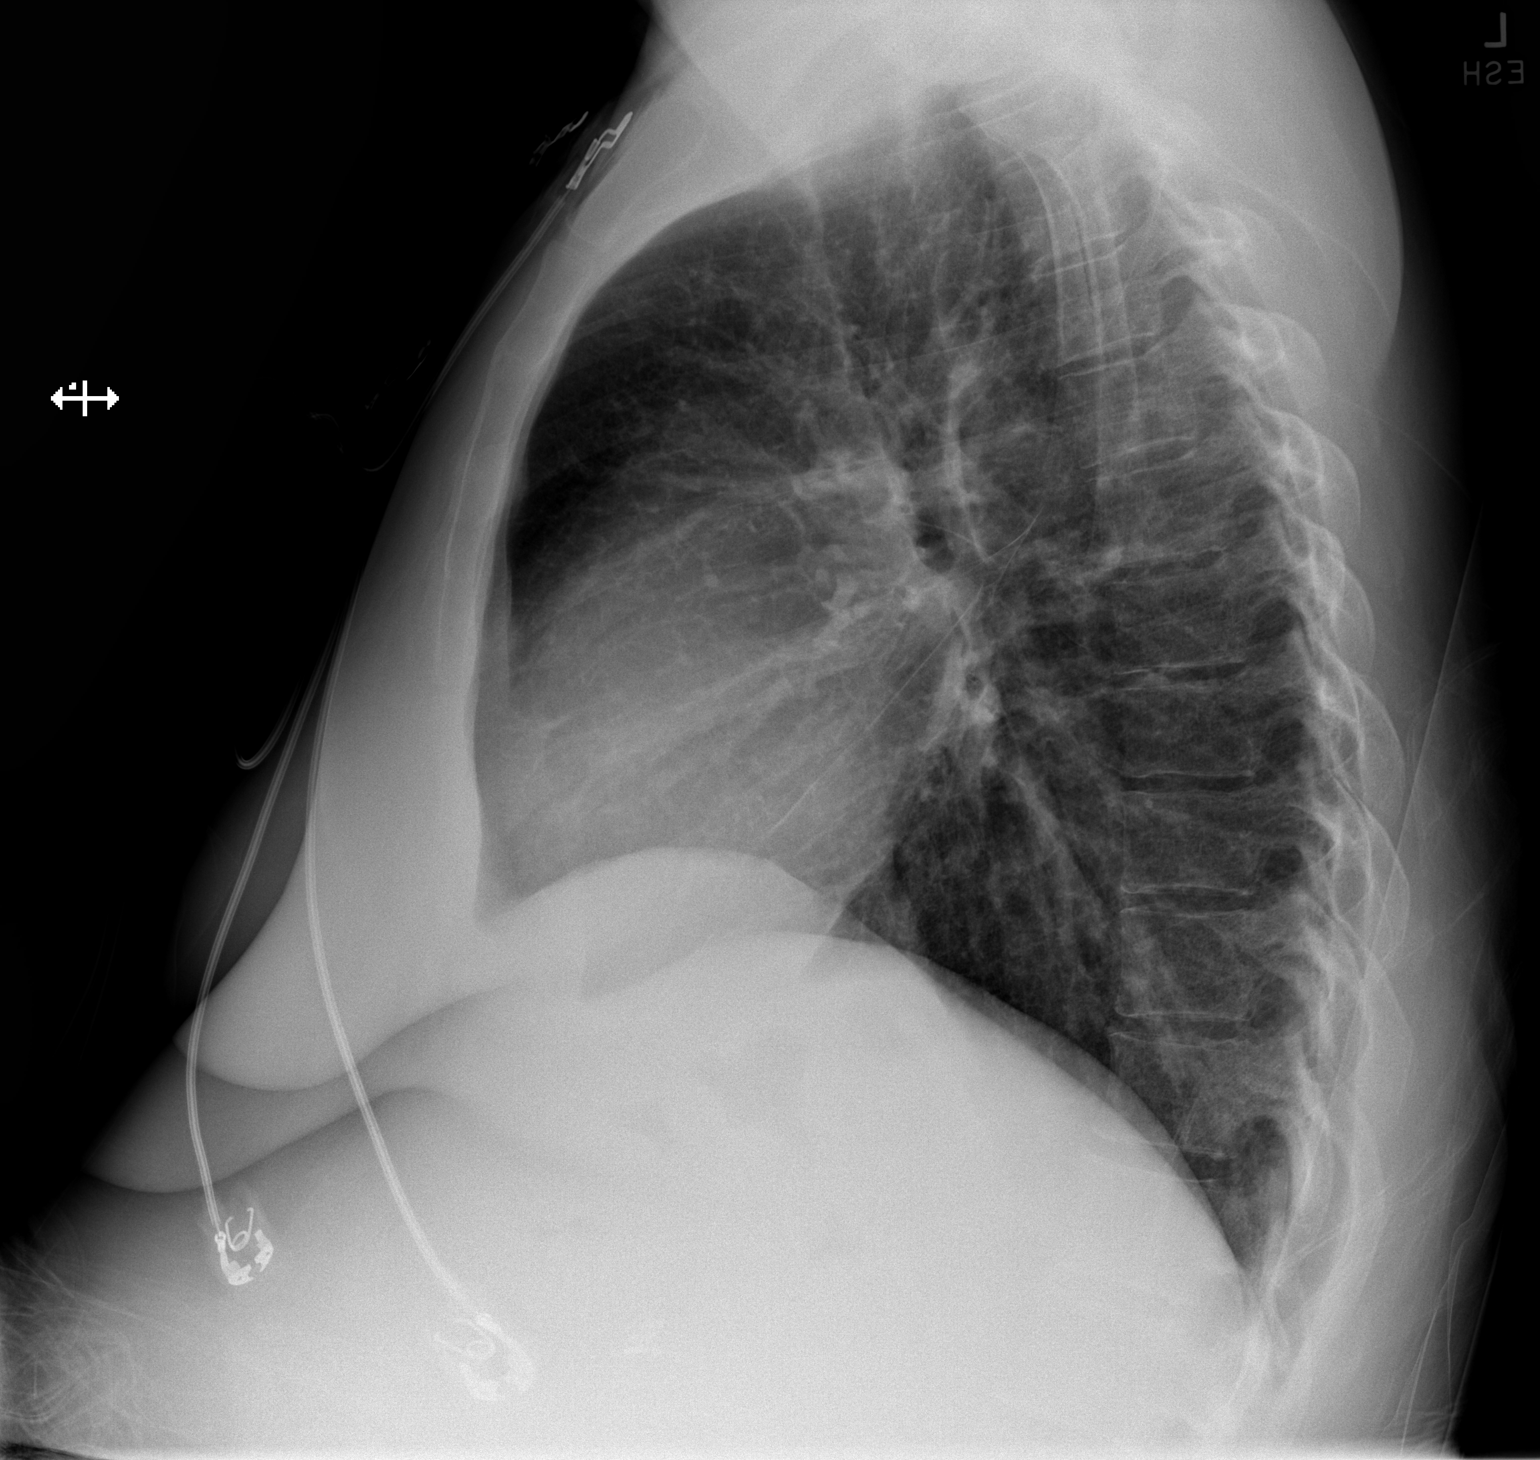

[x chest ap]
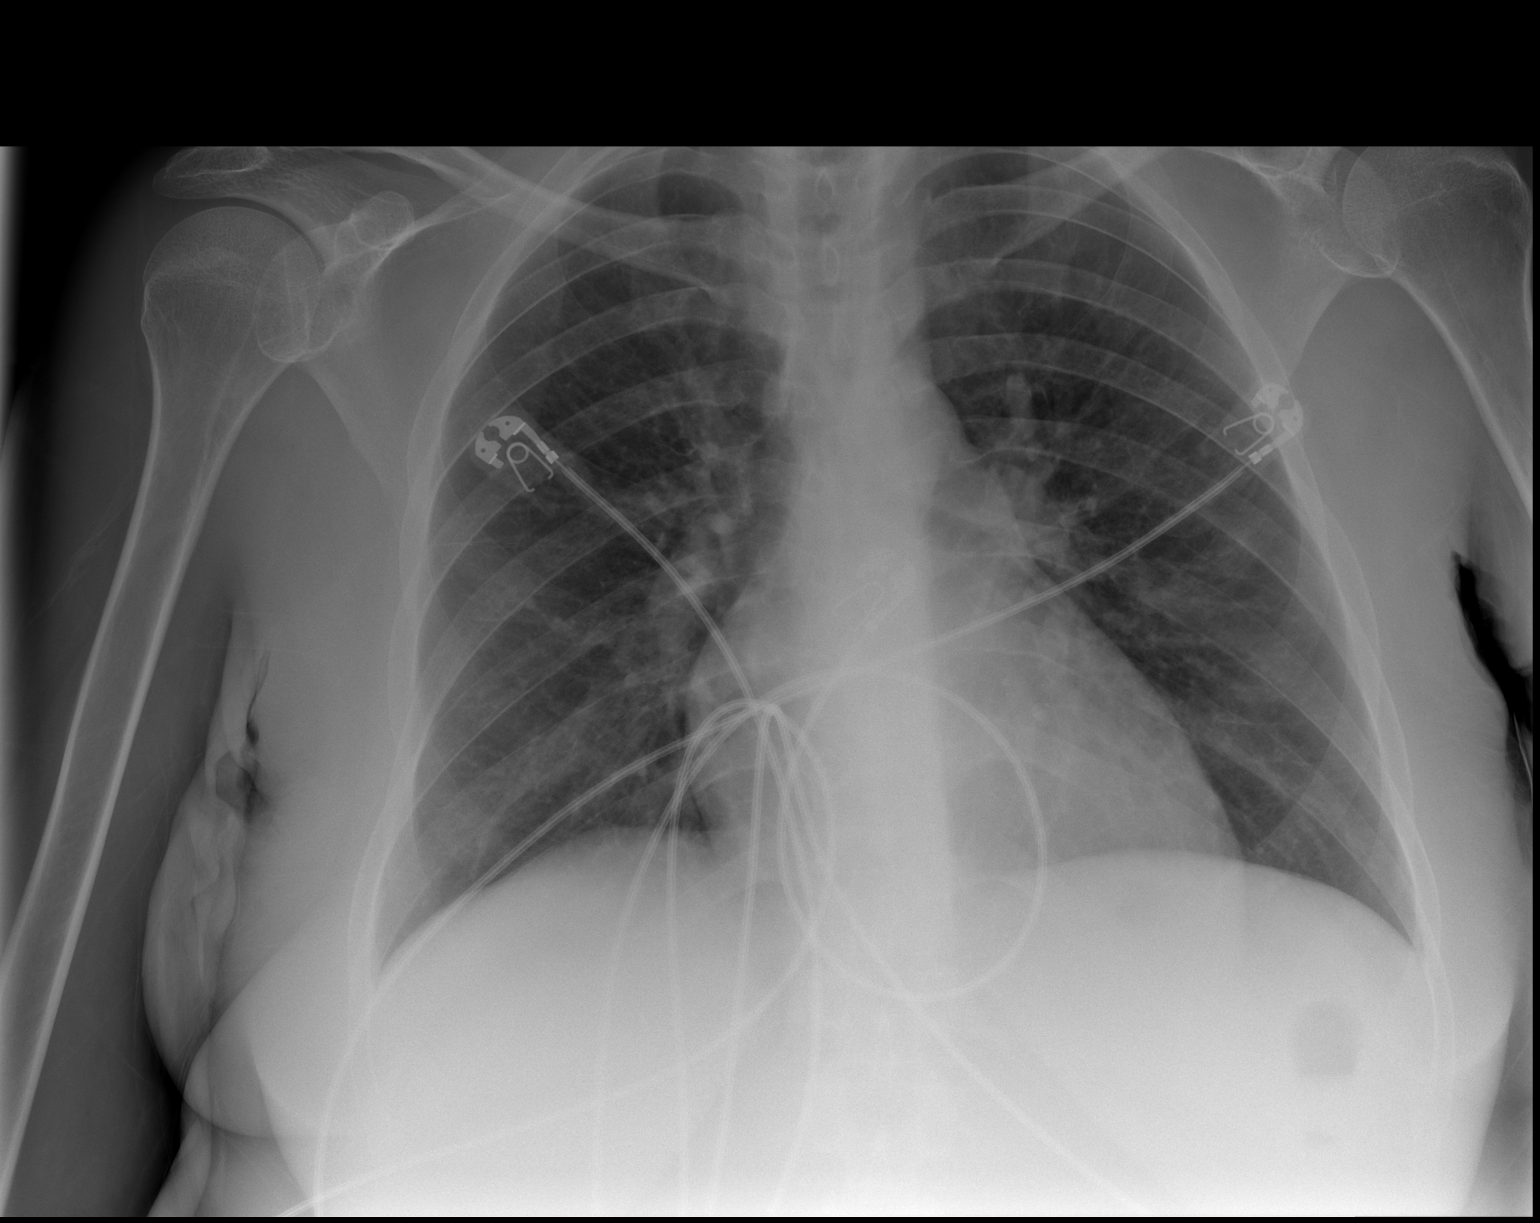

[2 of 2 positions shown; findings below may reference images not displayed]

FINDINGS: There is some faint patchy opacities in the left mid lung. The lungs
are otherwise clear. There is no pleural effusion or pneumothorax.
The cardiomediastinal silhouette is within normal limits. The
osseous structures are within normal limits.
IMPRESSION: 1. Faint patchy opacities in the left mid lung may represent
atelectasis or infection.

## 2020-12-20 IMAGING — CT CT ABD-PELV W/O CM
2 of 4 series · 16 of 46 positions shown, 18 images · non-contrast
Comparison: CT abdomen/pelvis [DATE]

CLINICAL DATA: Abdominal pain

EXAM:
CT ABDOMEN AND PELVIS WITHOUT CONTRAST
TECHNIQUE: Multidetector CT imaging of the abdomen and pelvis was performed
following the standard protocol without IV contrast.

[Series 2: axial st · axial · 0.89mm/px · z∈[+1228,+1648]mm · 13 of 94 slices shown, 15 images]
[im 5/94  soft-tissue]
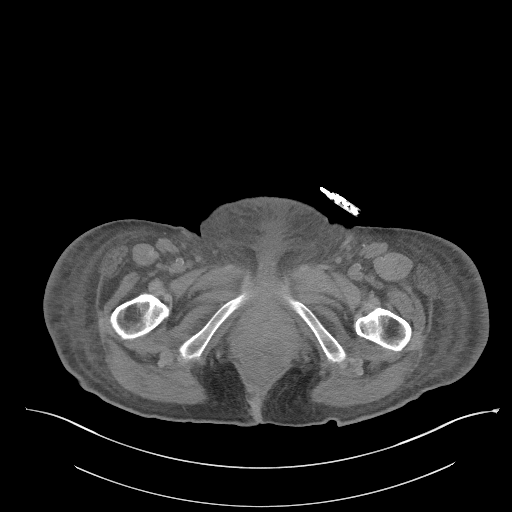
[im 5/94  bone]
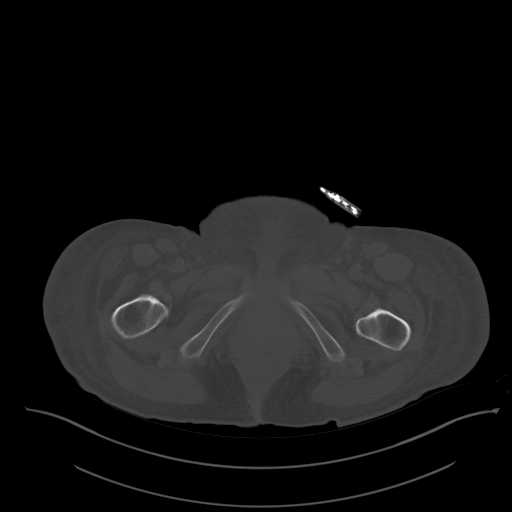
[im 15/94  soft-tissue]
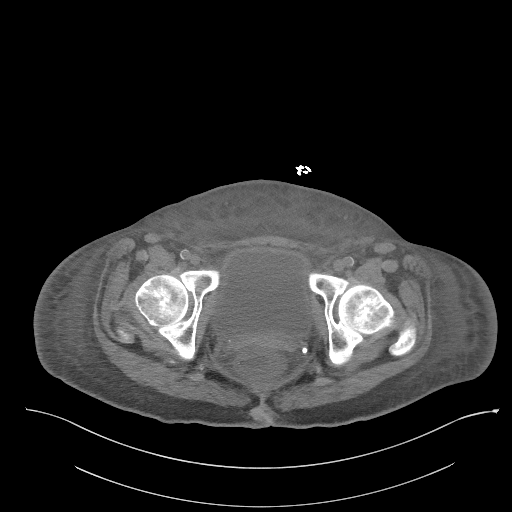
[im 20/94  soft-tissue]
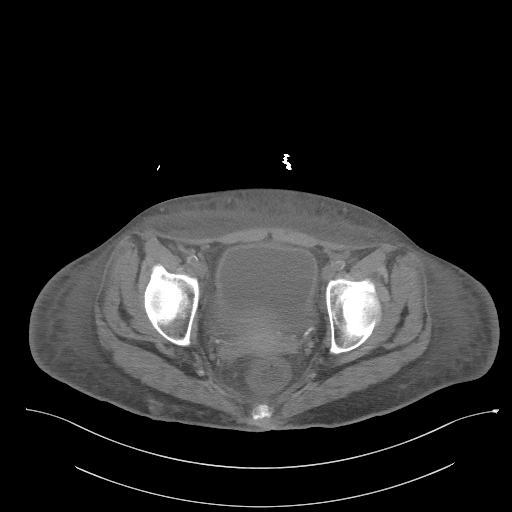
[im 25/94  soft-tissue]
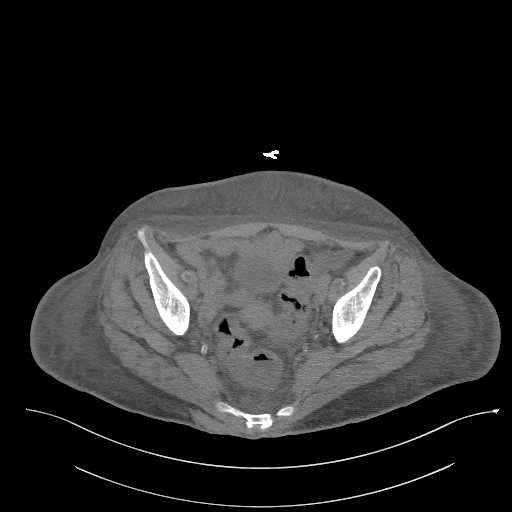
[im 35/94  soft-tissue]
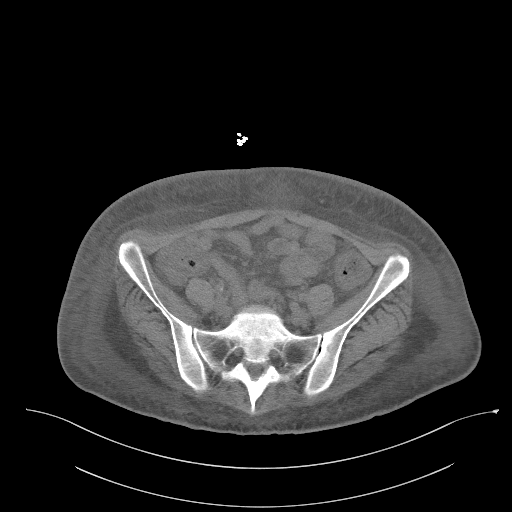
[im 40/94  soft-tissue]
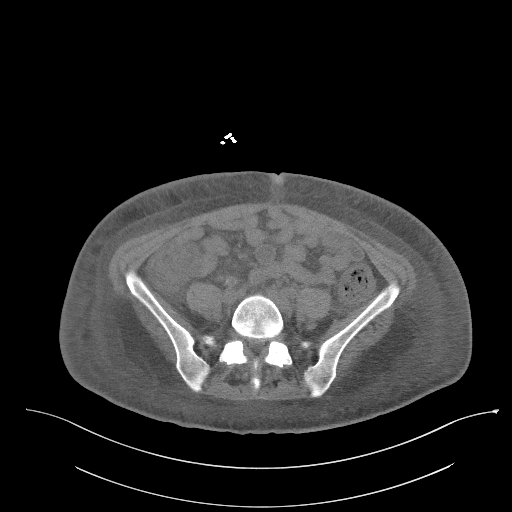
[im 49/94  soft-tissue]
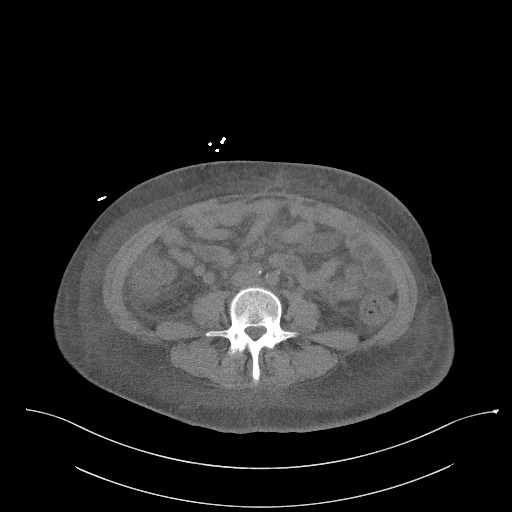
[im 54/94  soft-tissue]
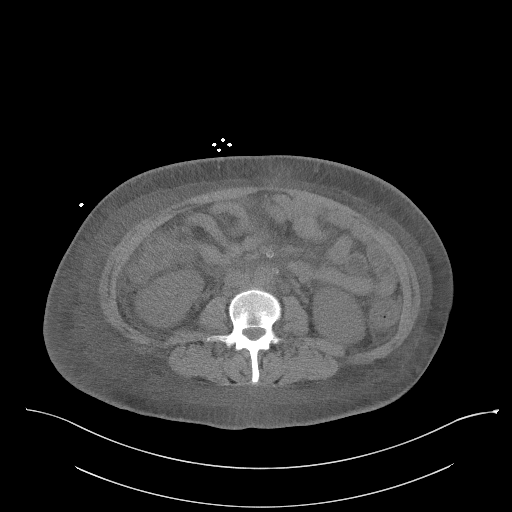
[im 59/94  soft-tissue]
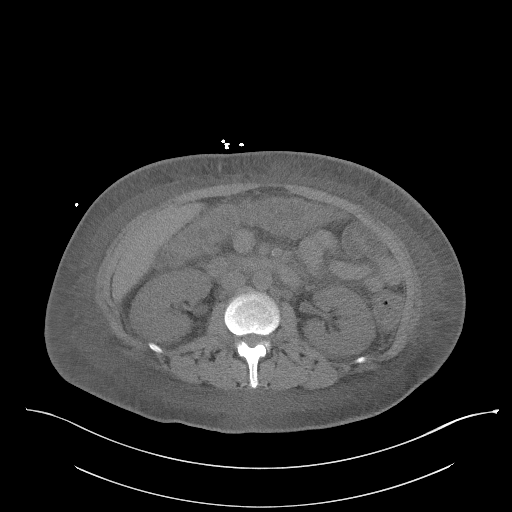
[im 59/94  bone]
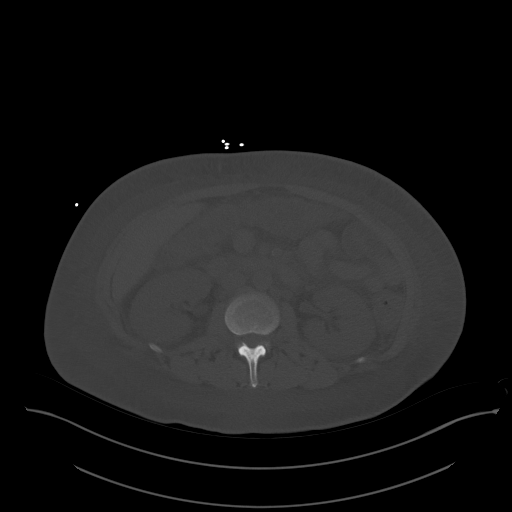
[im 69/94  soft-tissue]
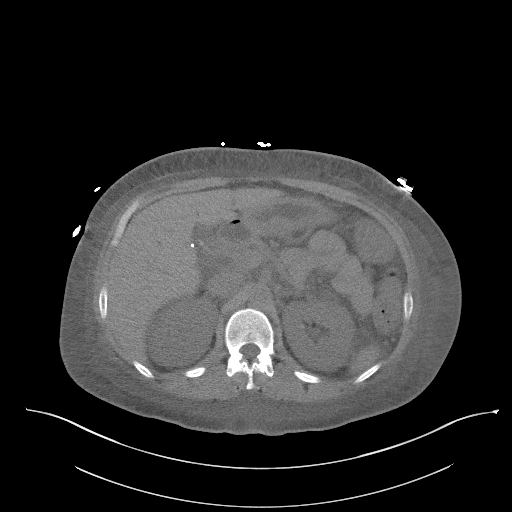
[im 74/94  soft-tissue]
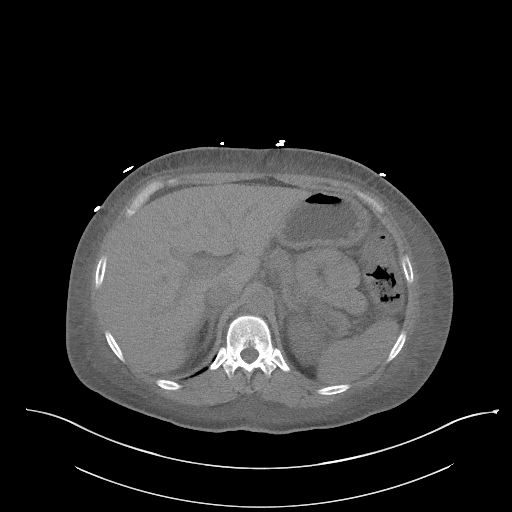
[im 79/94  soft-tissue]
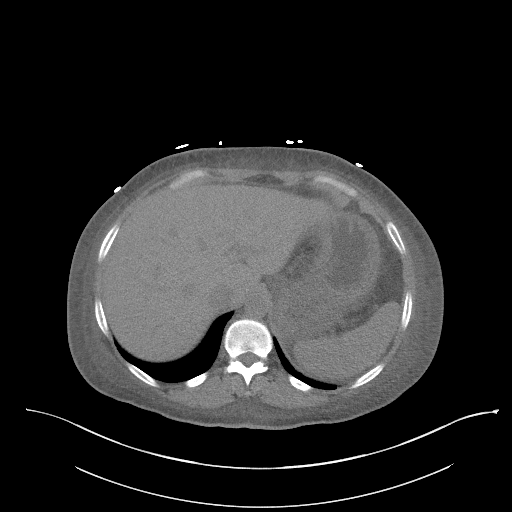
[im 89/94  soft-tissue]
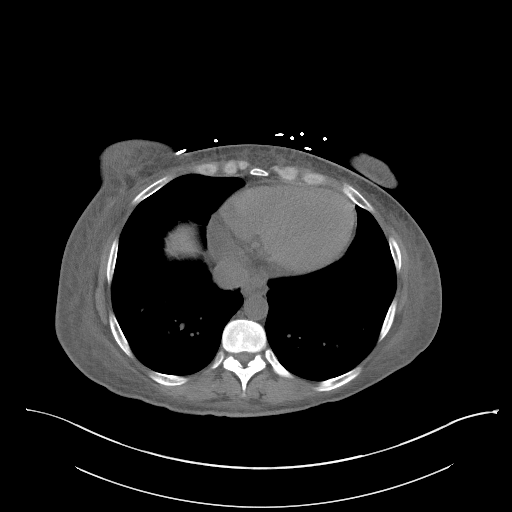

[Series 5: coronal st · coronal · 1.05mm/px · 3 of 100 slices shown]
[im 34/100  soft-tissue]
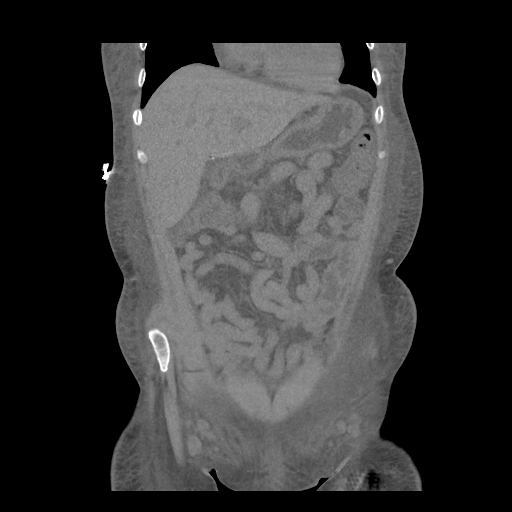
[im 45/100  soft-tissue]
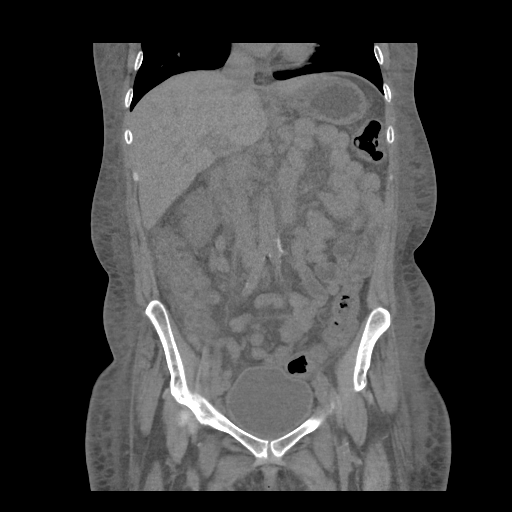
[im 56/100  soft-tissue]
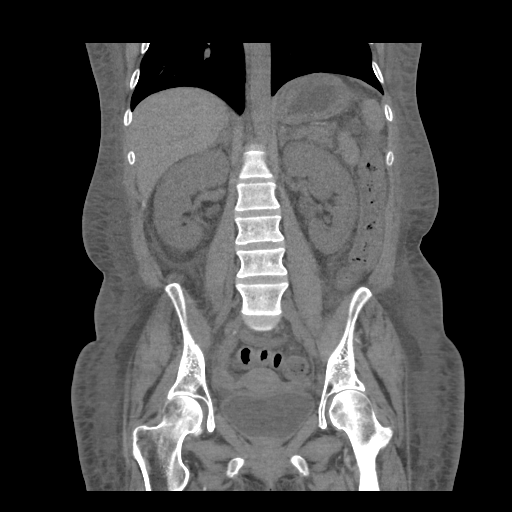

[16 of 46 positions shown; findings below may reference images not displayed]

FINDINGS: Lower chest: There is no focal consolidation or pleural effusion in
the lung bases. The imaged heart is unremarkable.

Hepatobiliary: The liver is unremarkable. The gallbladder is
surgically absent. There is no biliary ductal dilatation.

Pancreas: The pancreas is grossly unremarkable, within the confines
of noncontrast technique. No definite focal lesions or contour
abnormalities are seen. The main pancreatic duct is mildly prominent
in the head but normal in caliber distally, similar going back to
[HU]

Spleen: Unremarkable.

Adrenals/Urinary Tract: The adrenals are unremarkable.

The kidneys are unremarkable, with no focal lesion, stone,
hydronephrosis, or hydroureter. There is mild bladder wall
thickening. The bladder is otherwise unremarkable.

Stomach/Bowel: The stomach is unremarkable. There is no evidence of
bowel obstruction. There is mild diffuse thickening of the colonic
wall which is similar in appearance to prior studies.

Vascular/Lymphatic: There is mild calcification of the arterial
vasculature. The abdominal aorta is nonaneurysmal. There is no
abdominal or pelvic lymphadenopathy.

Reproductive: The uterus and adnexa are unremarkable.

Other: There is scattered ascites in the abdomen and pelvis, similar
to recent prior studies. There is no free intraperitoneal air. There
is diffuse anasarca.

Scarring in the subcutaneous fat of the right gluteal region related
to a prior abscess drain is similar to the prior study. No patent
fistula is identified.

Musculoskeletal: There is no acute osseous abnormality or aggressive
osseous lesion.
IMPRESSION: 1. Overall, no significant interval change since [DATE].
[DATE]. Mild diffuse colonic wall thickening may reflect third-spacing
(favored), but could also reflect nonspecific colitis.
3. Diffuse anasarca and scattered small volume ascites also suggest
third spacing.
4. Mild bladder wall thickening can be seen with cystitis. Correlate
with urinalysis.

## 2020-12-20 MED ORDER — HYDRALAZINE HCL 20 MG/ML IJ SOLN
10.0000 mg | Freq: Four times a day (QID) | INTRAMUSCULAR | Status: DC | PRN
  Administered 2020-12-20: 10 mg via INTRAVENOUS
  Filled 2020-12-20: qty 1

## 2020-12-20 MED ORDER — CYCLOBENZAPRINE HCL 10 MG PO TABS: 10.0000 mg | ORAL_TABLET | Freq: Three times a day (TID) | ORAL | Status: DC | PRN

## 2020-12-20 MED ORDER — DICYCLOMINE HCL 20 MG PO TABS: 20.0000 mg | ORAL_TABLET | Freq: Three times a day (TID) | ORAL | Status: DC | PRN

## 2020-12-20 MED ORDER — HYDROMORPHONE HCL 1 MG/ML IJ SOLN
1.0000 mg | Freq: Once | INTRAMUSCULAR | Status: AC
Start: 2020-12-20 — End: 2020-12-20
  Administered 2020-12-20: 1 mg via INTRAVENOUS
  Filled 2020-12-20: qty 1

## 2020-12-20 MED ORDER — HYDROMORPHONE HCL 1 MG/ML IJ SOLN
1.0000 mg | Freq: Once | INTRAMUSCULAR | Status: AC
  Administered 2020-12-20: 1 mg via INTRAVENOUS
  Filled 2020-12-20: qty 1

## 2020-12-20 MED ORDER — ALBUTEROL SULFATE HFA 108 (90 BASE) MCG/ACT IN AERS
2.0000 | INHALATION_SPRAY | Freq: Four times a day (QID) | RESPIRATORY_TRACT | Status: DC
  Administered 2020-12-20 – 2020-12-21 (×3): 2 via RESPIRATORY_TRACT
  Filled 2020-12-20: qty 6.7

## 2020-12-20 MED ORDER — VITAMIN B-12 1000 MCG PO TABS
1000.0000 ug | ORAL_TABLET | Freq: Every day | ORAL | Status: DC
  Administered 2020-12-20 – 2020-12-21 (×2): 1000 ug via ORAL
  Filled 2020-12-20 (×2): qty 1

## 2020-12-20 MED ORDER — TRAZODONE HCL 50 MG PO TABS: 25.0000 mg | ORAL_TABLET | Freq: Every evening | ORAL | Status: DC | PRN

## 2020-12-20 MED ORDER — GABAPENTIN 100 MG PO CAPS
200.0000 mg | ORAL_CAPSULE | Freq: Three times a day (TID) | ORAL | Status: DC
  Administered 2020-12-20 – 2020-12-21 (×3): 200 mg via ORAL
  Filled 2020-12-20 (×3): qty 2

## 2020-12-20 MED ORDER — BUSPIRONE HCL 5 MG PO TABS
5.0000 mg | ORAL_TABLET | Freq: Three times a day (TID) | ORAL | Status: DC
  Administered 2020-12-20 – 2020-12-21 (×3): 5 mg via ORAL
  Filled 2020-12-20 (×3): qty 1

## 2020-12-20 MED ORDER — CARVEDILOL 12.5 MG PO TABS: 12.5000 mg | ORAL_TABLET | Freq: Two times a day (BID) | ORAL | Status: DC

## 2020-12-20 MED ORDER — METOCLOPRAMIDE HCL 5 MG PO TABS
5.0000 mg | ORAL_TABLET | Freq: Three times a day (TID) | ORAL | Status: DC
  Administered 2020-12-20 – 2020-12-21 (×3): 5 mg via ORAL
  Filled 2020-12-20 (×3): qty 1

## 2020-12-20 MED ORDER — FERROUS SULFATE 325 (65 FE) MG PO TABS
325.0000 mg | ORAL_TABLET | Freq: Two times a day (BID) | ORAL | Status: DC
  Administered 2020-12-20 – 2020-12-21 (×2): 325 mg via ORAL
  Filled 2020-12-20 (×2): qty 1

## 2020-12-20 MED ORDER — OXYCODONE HCL 5 MG PO TABS
5.0000 mg | ORAL_TABLET | ORAL | Status: DC | PRN
  Administered 2020-12-20 – 2020-12-21 (×3): 5 mg via ORAL
  Filled 2020-12-20 (×3): qty 1

## 2020-12-20 MED ORDER — ENOXAPARIN SODIUM 40 MG/0.4ML IJ SOSY
40.0000 mg | PREFILLED_SYRINGE | INTRAMUSCULAR | Status: DC
  Administered 2020-12-20: 40 mg via SUBCUTANEOUS
  Filled 2020-12-20 (×2): qty 0.4

## 2020-12-20 MED ORDER — MORPHINE SULFATE (PF) 2 MG/ML IV SOLN
1.0000 mg | INTRAVENOUS | Status: DC | PRN
  Administered 2020-12-20 – 2020-12-21 (×4): 1 mg via INTRAVENOUS
  Filled 2020-12-20 (×4): qty 1

## 2020-12-20 MED ORDER — ALBUTEROL SULFATE (2.5 MG/3ML) 0.083% IN NEBU: 2.5000 mg | INHALATION_SOLUTION | Freq: Four times a day (QID) | RESPIRATORY_TRACT | Status: DC

## 2020-12-20 MED ORDER — LORAZEPAM 0.5 MG PO TABS
0.5000 mg | ORAL_TABLET | Freq: Two times a day (BID) | ORAL | Status: DC
  Administered 2020-12-20 – 2020-12-21 (×2): 0.5 mg via ORAL
  Filled 2020-12-20 (×2): qty 1

## 2020-12-20 MED ORDER — ACETAMINOPHEN 325 MG PO TABS: 650.0000 mg | ORAL_TABLET | Freq: Four times a day (QID) | ORAL | Status: DC | PRN

## 2020-12-20 MED ORDER — FOLIC ACID 1 MG PO TABS
1.0000 mg | ORAL_TABLET | Freq: Every day | ORAL | Status: DC
  Administered 2020-12-20 – 2020-12-21 (×2): 1 mg via ORAL
  Filled 2020-12-20 (×2): qty 1

## 2020-12-20 MED ORDER — ACETAMINOPHEN 500 MG PO TABS: 1000.0000 mg | ORAL_TABLET | Freq: Four times a day (QID) | ORAL | Status: DC | PRN

## 2020-12-20 MED ORDER — LEVETIRACETAM 500 MG PO TABS
500.0000 mg | ORAL_TABLET | Freq: Two times a day (BID) | ORAL | Status: DC
  Administered 2020-12-20 – 2020-12-21 (×2): 500 mg via ORAL
  Filled 2020-12-20 (×2): qty 1

## 2020-12-20 MED ORDER — SODIUM CHLORIDE 0.9 % IV SOLN
12.5000 mg | Freq: Four times a day (QID) | INTRAVENOUS | Status: DC | PRN
  Administered 2020-12-20: 12.5 mg via INTRAVENOUS
  Filled 2020-12-20: qty 0.5
  Filled 2020-12-20: qty 12.5

## 2020-12-20 MED ORDER — INSULIN GLARGINE-YFGN 100 UNIT/ML ~~LOC~~ SOLN
5.0000 [IU] | Freq: Every day | SUBCUTANEOUS | Status: DC
  Administered 2020-12-20 – 2020-12-21 (×2): 5 [IU] via SUBCUTANEOUS
  Filled 2020-12-20 (×2): qty 0.05

## 2020-12-20 MED ORDER — PANTOPRAZOLE SODIUM 40 MG PO TBEC
40.0000 mg | DELAYED_RELEASE_TABLET | Freq: Every day | ORAL | Status: DC
  Administered 2020-12-20 – 2020-12-21 (×2): 40 mg via ORAL
  Filled 2020-12-20 (×2): qty 1

## 2020-12-20 MED ORDER — HYDRALAZINE HCL 50 MG PO TABS
50.0000 mg | ORAL_TABLET | Freq: Four times a day (QID) | ORAL | Status: DC
  Administered 2020-12-20 – 2020-12-21 (×5): 50 mg via ORAL
  Filled 2020-12-20: qty 1
  Filled 2020-12-20: qty 2
  Filled 2020-12-20 (×2): qty 1
  Filled 2020-12-20: qty 2

## 2020-12-20 MED ORDER — ACETAMINOPHEN 650 MG RE SUPP: 650.0000 mg | Freq: Four times a day (QID) | RECTAL | Status: DC | PRN

## 2020-12-20 MED ORDER — AMLODIPINE BESYLATE 10 MG PO TABS
10.0000 mg | ORAL_TABLET | Freq: Every day | ORAL | Status: DC
  Administered 2020-12-20 – 2020-12-21 (×2): 10 mg via ORAL
  Filled 2020-12-20 (×2): qty 1

## 2020-12-20 MED ORDER — SODIUM CHLORIDE 0.9 % IV BOLUS
1000.0000 mL | Freq: Once | INTRAVENOUS | Status: AC
  Administered 2020-12-20: 1000 mL via INTRAVENOUS

## 2020-12-20 MED ORDER — CARVEDILOL 12.5 MG PO TABS
12.5000 mg | ORAL_TABLET | Freq: Two times a day (BID) | ORAL | Status: DC
  Administered 2020-12-20 – 2020-12-21 (×3): 12.5 mg via ORAL
  Filled 2020-12-20 (×3): qty 1

## 2020-12-20 MED ORDER — PROCHLORPERAZINE EDISYLATE 10 MG/2ML IJ SOLN
10.0000 mg | Freq: Once | INTRAMUSCULAR | Status: AC
  Administered 2020-12-20: 10 mg via INTRAVENOUS
  Filled 2020-12-20: qty 2

## 2020-12-20 MED ORDER — ONDANSETRON 4 MG PO TBDP
4.0000 mg | ORAL_TABLET | Freq: Three times a day (TID) | ORAL | Status: DC | PRN
Start: 2020-12-20 — End: 2020-12-21
  Administered 2020-12-20: 4 mg via ORAL
  Filled 2020-12-20: qty 1

## 2020-12-20 NOTE — ED Notes (Signed)
Report sent to floor nurse.

## 2020-12-20 NOTE — Plan of Care (Signed)
  Problem: Education: Goal: Knowledge of General Education information will improve Description: Including pain rating scale, medication(s)/side effects and non-pharmacologic comfort measures 12/20/2020 1622 by Phillips Hay, RN Outcome: Progressing 12/20/2020 1622 by Phillips Hay, RN Outcome: Progressing   Problem: Health Behavior/Discharge Planning: Goal: Ability to manage health-related needs will improve 12/20/2020 1622 by [place] Welliver, Jennye Moccasin, RN Outcome: Progressing 12/20/2020 1622 by Phillips Hay, RN Outcome: Progressing   Problem: Clinical Measurements: Goal: Ability to maintain clinical measurements within normal limits will improve 12/20/2020 1622 by Phillips Hay, RN Outcome: Progressing 12/20/2020 1622 by Phillips Hay, RN Outcome: Progressing Goal: Will remain free from infection 12/20/2020 1622 by Phillips Hay, RN Outcome: Progressing 12/20/2020 1622 by Phillips Hay, RN Outcome: Progressing Goal: Diagnostic test results will improve 12/20/2020 1622 by Phillips Hay, RN Outcome: Progressing 12/20/2020 1622 by Phillips Hay, RN Outcome: Progressing Goal: Respiratory complications will improve 12/20/2020 1622 by Phillips Hay, RN Outcome: Progressing 12/20/2020 1622 by Phillips Hay, RN Outcome: Progressing Goal: Cardiovascular complication will be avoided 12/20/2020 1622 by Phillips Hay, RN Outcome: Progressing 12/20/2020 1622 by Phillips Hay, RN Outcome: Progressing   Problem: Activity: Goal: Risk for activity intolerance will decrease 12/20/2020 1622 by Dallin Mccorkel, Jennye Moccasin, RN Outcome: Progressing 12/20/2020 1622 by Phillips Hay, RN Outcome: Progressing   Problem: Nutrition: Goal: Adequate nutrition will be maintained 12/20/2020 1622 by Phillips Hay, RN Outcome: Progressing 12/20/2020 1622 by Phillips Hay, RN Outcome: Progressing   Problem: Coping: Goal: Level of anxiety will decrease 12/20/2020 1622 by Phillips Hay, RN Outcome: Progressing 12/20/2020 1622 by Phillips Hay, RN Outcome: Progressing   Problem: Elimination: Goal: Will not experience complications related to bowel motility 12/20/2020 1622 by Phillips Hay, RN Outcome: Progressing 12/20/2020 1622 by Phillips Hay, RN Outcome: Progressing Goal: Will not experience complications related to urinary retention 12/20/2020 1622 by Phillips Hay, RN Outcome: Progressing 12/20/2020 1622 by Phillips Hay, RN Outcome: Progressing   Problem: Pain Managment: Goal: General experience of comfort will improve 12/20/2020 1622 by Phillips Hay, RN Outcome: Progressing 12/20/2020 1622 by Phillips Hay, RN Outcome: Progressing   Problem: Safety: Goal: Ability to remain free from injury will improve 12/20/2020 1622 by Phillips Hay, RN Outcome: Progressing 12/20/2020 1622 by Phillips Hay, RN Outcome: Progressing   Problem: Skin Integrity: Goal: Risk for impaired skin integrity will decrease 12/20/2020 1622 by Phillips Hay, RN Outcome: Progressing 12/20/2020 1622 by Phillips Hay, RN Outcome: Progressing

## 2020-12-20 NOTE — Plan of Care (Signed)
  Problem: Education: Goal: Knowledge of General Education information will improve Description: Including pain rating scale, medication(s)/side effects and non-pharmacologic comfort measures Outcome: Progressing   Problem: Clinical Measurements: Goal: Ability to maintain clinical measurements within normal limits will improve Outcome: Progressing   

## 2020-12-20 NOTE — H&P (Signed)
[facility] History and Physical  [name] NOI:[id number] DOB: March 11, 1991 DOA: 12/20/2020 PCP: [name], MD  Admitted from: home Chief Complaint: Intractable nausea, vomiting  History of Present Illness: [name] is a 30 y.o. female with PMH significant for DM1, HTN, CKD 3A,  s/p right transtibial amputation, depression/anxiety, fibromyalgia, chronic anemia, GERD, gastroparesis who has been hospitalized multiple times in the past for intractable nausea, vomiting attributed to gastroparesis and her continued use of marijuana. Her recent admission was a week ago from 9/14-9/18 after she was found unresponsive by due to accidental Percocet overdose. Patient presented to the ED today with complaint of back pain, vomiting and foot pain for 2 days.  She has associated lower back pain probably because of persistent retching.  Symptoms did not improve with Percocet and Phenergan at home and hence presented to the ED. CT abdomen and pelvis was obtained which did not show any significant change compared to CT scan from 12/03/2020 She was monitored for several hours in the ED and was witnessed to have vomiting and hence observation under hospitalist service was requested.  At the time of my evaluation, patient was propped up in ED stretcher.  Nauseous.     Review of Systems:  All systems were reviewed and were negative unless otherwise mentioned in the HPI   Past medical history: Past Medical History:  Diagnosis Date   Allergy    Anemia    Anxiety    Blood transfusion without reported diagnosis    Dec 2018   Cataract    right eye   COVID-19 03/2020   Depression    Diabetes type 1, uncontrolled ([place]) 11/14/2011   Since age 62   Fibromyalgia    Gastroparesis    GERD (gastroesophageal reflux disease)    Hypertension    Infection    UTI April 2016    Past surgical history: Past Surgical History:  Procedure Laterality Date   AMPUTATION Right 04/13/2020    Procedure: 1ST RAY AMPUTATION RIGHT FOOT;  Surgeon: Newt Minion, MD;  Location: [facility];  Service: Orthopedics;  Laterality: Right;   AMPUTATION Right 06/06/2020   Procedure: RIGHT BELOW KNEE AMPUTATION;  Surgeon: Newt Minion, MD;  Location: [facility];  Service: Orthopedics;  Laterality: Right;   AMPUTATION Left    great toe   ANKLE SURGERY     APPLICATION OF WOUND VAC Right 06/06/2020   Procedure: APPLICATION OF WOUND VAC;  Surgeon: Newt Minion, MD;  Location: [facility];  Service: Orthopedics;  Laterality: Right;   CHOLECYSTECTOMY  11/15/2011   Procedure: LAPAROSCOPIC CHOLECYSTECTOMY WITH INTRAOPERATIVE CHOLANGIOGRAM;  Surgeon: [name], MD;  Location: [facility];  Service: General;  Laterality: N/A;   COLONOSCOPY     COLONOSCOPY WITH PROPOFOL N/A 06/27/2017   Procedure: COLONOSCOPY WITH PROPOFOL;  Surgeon: Milus Banister, MD;  Location: [facility];  Service: Endoscopy;  Laterality: N/A;   ESOPHAGOGASTRODUODENOSCOPY  12/03/2011   Procedure: ESOPHAGOGASTRODUODENOSCOPY (EGD);  Surgeon: Beryle Beams, MD;  Location: [facility];  Service: Endoscopy;  Laterality: N/A;   FLEXIBLE SIGMOIDOSCOPY N/A 03/10/2017   Procedure: FLEXIBLE SIGMOIDOSCOPY;  Surgeon: Carol Ada, MD;  Location: [facility];  Service: Endoscopy;  Laterality: N/A;   INCISION AND DRAINAGE PERIRECTAL ABSCESS N/A 03/01/2017   Procedure: IRRIGATION AND DEBRIDEMENT PERIRECTAL ABSCESS;  Surgeon: Alphonsa Overall, MD;  Location: [facility];  Service: General;  Laterality: N/A;   IRRIGATION AND DEBRIDEMENT BUTTOCKS N/A 03/23/2017   Procedure: IRRIGATION AND DEBRIDEMENT BUTTOCKS, SETON  PLACEMENT;  Surgeon: [name], MD;  Location: [facility];  Service: General;  Laterality: N/A;   LAPAROSCOPY  11/23/2011   Procedure: LAPAROSCOPY DIAGNOSTIC;  Surgeon: Edward Jolly, MD;  Location: [facility];  Service: General;  Laterality: N/A;   SIGMOIDOSCOPY     UPPER GASTROINTESTINAL ENDOSCOPY     WISDOM TOOTH EXTRACTION      Social  History:  reports that she has never smoked. She has never used smokeless tobacco. She reports that she does not currently use drugs after having used the following drugs: Marijuana. She reports that she does not drink alcohol.  Allergies:  Allergies  Allergen Reactions   Other Anaphylaxis    Reaction to [place] nuts    Lactose Intolerance (Gi) Diarrhea    Family history:  Family History  Problem Relation Age of Onset   Diabetes Mother    Hypertension Father    Colon cancer Paternal Grandmother        pt thinks PGM was dx in her 68's   Diabetes Paternal Grandmother    Diabetes Maternal Grandmother    Diabetes Maternal Grandfather    Diabetes Paternal Grandfather    Diabetes Other    Breast cancer Paternal Aunt    Esophageal cancer Neg Hx    Liver cancer Neg Hx    Pancreatic cancer Neg Hx    Stomach cancer Neg Hx    Rectal cancer Neg Hx      Home Meds: Prior to Admission medications   Medication Sig Start Date End Date Taking? Authorizing Provider  acetaminophen (TYLENOL) 500 MG tablet Take 1,000 mg by mouth every 6 (six) hours as needed for moderate pain or headache.   Yes [provider]  Albuterol Sulfate (PROAIR RESPICLICK) 100 (90 Base) MCG/ACT AEPB Inhale 2 puffs into the lungs daily as needed. Patient taking differently: Inhale 2 puffs into the lungs daily as needed (wheezing). 11/08/20 02/02/21 Yes Allwardt, Alyssa M, PA-C  amLODipine (NORVASC) 10 MG tablet Take 1 tablet (10 mg total) by mouth daily. 11/08/20  Yes Allwardt, Alyssa M, PA-C  busPIRone (BUSPAR) 5 MG tablet Take 1 tablet (5 mg total) by mouth 3 (three) times daily. 02/03/20  Yes [name], MD  carvedilol (COREG) 12.5 MG tablet Take 1 tablet (12.5 mg total) by mouth 2 (two) times daily with a meal. 04/16/20 02/02/21 Yes Shriyans Kuenzi, Marlowe Aschoff, MD  cyclobenzaprine (FLEXERIL) 10 MG tablet Take 1 tablet (10 mg total) by mouth 3 (three) times daily as needed for muscle spasms. 11/15/20  Yes [name], MD   dicyclomine (BENTYL) 20 MG tablet Take 1 tablet (20 mg total) by mouth every 8 (eight) hours as needed for spasms. 11/25/19  Yes Petrucelli, Samantha R, PA-C  EPINEPHrine 0.3 mg/0.3 mL IJ SOAJ injection Inject 0.3 mg into the muscle once as needed (anaphylaxis/allergic reaction). Patient taking differently: Inject 0.3 mg into the muscle once as needed for anaphylaxis. 07/16/20  Yes [name], MD  ferrous sulfate 325 (65 FE) MG tablet Take 1 tablet (325 mg total) by mouth 2 (two) times daily with a meal. 12/16/20  Yes Thurnell Lose, MD  folic acid (FOLVITE) 1 MG tablet Take 1 tablet (1 mg total) by mouth daily. 12/16/20  Yes Thurnell Lose, MD  gabapentin (NEURONTIN) 100 MG capsule Take 200 mg by mouth 3 (three) times daily. 11/02/20  Yes [provider]  hydrALAZINE (APRESOLINE) 50 MG tablet Take 50 mg by mouth 4 (four) times daily. 09/14/20  Yes [provider]  insulin glargine (LANTUS) 100 UNIT/ML injection Inject 0.05 mLs (5 Units total) into the skin daily. 12/10/20  Yes Sheikh, Omair Latif, DO  insulin lispro (HUMALOG) 100 UNIT/ML injection Inject 0-5 Units into the skin 3 (three) times daily before meals. Sliding scale   Yes [provider]  levETIRAcetam (KEPPRA) 500 MG tablet Take 1 tablet (500 mg total) by mouth 2 (two) times daily. 12/10/20  Yes Sheikh, Omair Latif, DO  loperamide (IMODIUM) 2 MG capsule Take 6 mg by mouth daily as needed for diarrhea or loose stools.    Yes [provider]  LORazepam (ATIVAN) 0.5 MG tablet Take 1 tablet (0.5 mg total) by mouth 2 (two) times daily. 11/15/20  Yes [name], MD  metoCLOPramide (REGLAN) 5 MG tablet Take 1 tablet (5 mg total) by mouth 3 (three) times daily before meals. Please make and keep an appointment for further refills. 11/13/20 02/11/21 Yes [place] Donnamarie Poag, DO  Nutritional Supplements (,FEEDING SUPPLEMENT, PROSOURCE PLUS) liquid Take 30 mLs by mouth 2 (two) times daily between meals. 12/10/20   Yes Sheikh, Omair Latif, DO  ondansetron (ZOFRAN ODT) 4 MG disintegrating tablet Take 1 tablet (4 mg total) by mouth every 8 (eight) hours as needed for nausea or vomiting. 11/08/20  Yes Allwardt, Alyssa M, PA-C  pantoprazole (PROTONIX) 40 MG tablet Take 1 tablet (40 mg total) by mouth daily. 12/16/20  Yes Thurnell Lose, MD  traZODone (DESYREL) 50 MG tablet Take 0.5-1 tablets (25-50 mg total) by mouth at bedtime as needed for sleep. 11/15/20  Yes [name], MD  blood glucose meter kit and supplies by Other route. Check blood sugar 3-4 times    [provider]  Carboxymethylcell-Hypromellose (GENTEAL) 0.25-0.3 % GEL Place 1 drop into both eyes daily as needed (dryeyes).    [provider]  Continuous Blood Gluc Sensor (DEXCOM G6 SENSOR) MISC USE AS DIRECTED EVERY 10 DAYS 07/28/20   Renato Shin, MD  Continuous Blood Gluc Transmit (DEXCOM G6 TRANSMITTER) MISC USE AS DIRECTED TO  MONITOR  GLUCOSE  LEVELS 05/10/20   Renato Shin, MD  Insulin Pen Needle 32G X 4 MM MISC USE AS DIRECTED WITH INSULIN PENS 05/24/20 05/24/21  Mercy Riding, MD  Ipratropium-Albuterol (COMBIVENT) 20-100 MCG/ACT AERS respimat Inhale 1 puff into the lungs every 6 (six) hours as needed for wheezing or shortness of breath. 12/10/20   Raiford Noble Latif, DO  vitamin B-12 1000 MCG tablet Take 1 tablet (1,000 mcg total) by mouth daily. 12/16/20   Thurnell Lose, MD  zinc sulfate 220 (50 Zn) MG capsule Take 1 capsule (220 mg total) by mouth daily. Patient not taking: No sig reported 12/10/20   Raiford Noble Latif, DO  potassium chloride SA (KLOR-CON) 20 MEQ tablet Take 1 tablet (20 mEq total) by mouth daily for 5 doses. 07/04/19 07/30/19  Janeece Fitting, PA-C    Physical Exam: Vitals:   12/20/20 1410 12/20/20 1415 12/20/20 1430 12/20/20 1445  BP: (!) 194/112 (!) 189/111 (!) 194/111 (!) 188/114  Pulse: 98 96 97 96  Resp: _0 Temp:      TempSrc:      SpO2: 100% 100% 100% 100%  Weight:      Height:        Wt Readings from Last 3 Encounters:  12/20/20 72.6 kg  12/12/20 72.6 kg  12/07/20 75.2 kg   Body mass index is 25.06 kg/m.  General exam: Young African-American female. Skin: No  rashes, lesions or ulcers. HEENT: Atraumatic, normocephalic, no obvious bleeding Lungs: Clear to auscultation bilaterally CVS: Regular rate and rhythm, no murmur GI/Abd soft, mild diffuse tenderness, bowel sound present CNS: Alert, awake, oriented x3 Psychiatry: Depressed look Extremities: Right leg transtibial amputation status.     Consult Orders  (From admission, onward)           Start     Ordered   12/20/20 1346  Consult to hospitalist  Once       Provider:  (Not yet assigned)  Question Answer Comment  Place call to: [facility]   Reason for Consult Admit      12/20/20 1345            Labs on Admission:   CBC: Recent Labs  Lab 12/14/20 0048 12/15/20 0058 12/16/20 0110 12/20/20 1236  WBC 7.2 7.4 6.2 7.4  NEUTROABS 4.9 5.2 4.2 5.8  HGB 8.7* 9.6* 9.2* 11.0*  HCT 26.9* 29.7* 29.7* 34.1*  MCV 91.8 91.7 93.1 89.0  PLT 254 291 304 676    Basic Metabolic Panel: Recent Labs  Lab 12/14/20 0048 12/15/20 0058 12/16/20 0110 12/20/20 0820  NA 135 135 135 140  K 5.1 4.4 4.7 4.4  CL 106 107 109 108  CO2 19* 17* 16* 21*  GLUCOSE 180* 148* 127* 232*  BUN 64* 52* 49* 23*  CREATININE 2.43* 1.93* 1.73* 1.28*  CALCIUM 9.0 8.8* 8.8* 9.2  MG 2.3 2.1 2.0  --     Liver Function Tests: Recent Labs  Lab 12/14/20 0048 12/15/20 0058 12/16/20 0110 12/20/20 0820  AST 44* 35 24 23  ALT 55* 49* 36 20  ALKPHOS 194* 199* 174* 157*  BILITOT 0.7 0.4 0.5 0.6  PROT 6.3* 6.9 7.1 7.9  ALBUMIN 2.2* 2.3* 2.3* 2.8*   No results for input(s): LIPASE, AMYLASE in the last 168 hours. No results for input(s): AMMONIA in the last 168 hours.  Cardiac Enzymes: No results for input(s): CKTOTAL, CKMB, CKMBINDEX, TROPONINI in the last 168 hours.  BNP (last 3 results) Recent Labs     05/16/20 1515 06/04/20 0420 12/12/20 2235  BNP 757.2* 444.7* 1,104.9*    ProBNP (last 3 results) No results for input(s): PROBNP in the last 8760 hours.  CBG: Recent Labs  Lab 12/15/20 1142 12/15/20 1612 12/15/20 2030 12/16/20 0759 12/20/20 0643  GLUCAP 179* 189* 99 194* 217*    Lipase     Component Value Date/Time   LIPASE 19 11/29/2020 1245     Urinalysis    Component Value Date/Time   COLORURINE YELLOW 12/20/2020 1221   APPEARANCEUR CLEAR 12/20/2020 1221   LABSPEC 1.020 12/20/2020 1221   PHURINE 6.0 12/20/2020 1221   GLUCOSEU >=500 (A) 12/20/2020 1221   HGBUR LARGE (A) 12/20/2020 1221   BILIRUBINUR NEGATIVE 12/20/2020 1221   BILIRUBINUR negative 01/02/2015 1717   KETONESUR NEGATIVE 12/20/2020 1221   PROTEINUR >300 (A) 12/20/2020 1221   UROBILINOGEN 0.2 01/13/2015 0223   NITRITE NEGATIVE 12/20/2020 1221   LEUKOCYTESUR NEGATIVE 12/20/2020 1221     Drugs of Abuse     Component Value Date/Time   LABOPIA POSITIVE (A) 12/20/2020 1221   COCAINSCRNUR NONE DETECTED 12/20/2020 1221   LABBENZ NONE DETECTED 12/20/2020 1221   AMPHETMU NONE DETECTED 12/20/2020 1221   THCU POSITIVE (A) 12/20/2020 1221   LABBARB NONE DETECTED 12/20/2020 1221      Radiological Exams on Admission: CT ABDOMEN PELVIS WO CONTRAST  Result Date: 12/20/2020 CLINICAL DATA:  Abdominal pain EXAM: CT ABDOMEN AND  PELVIS WITHOUT CONTRAST TECHNIQUE: Multidetector CT imaging of the abdomen and pelvis was performed following the standard protocol without IV contrast. COMPARISON:  CT abdomen/pelvis 12/23/2020 FINDINGS: Lower chest: There is no focal consolidation or pleural effusion in the lung bases. The imaged heart is unremarkable. Hepatobiliary: The liver is unremarkable. The gallbladder is surgically absent. There is no biliary ductal dilatation. Pancreas: The pancreas is grossly unremarkable, within the confines of noncontrast technique. No definite focal lesions or contour abnormalities are seen.  The main pancreatic duct is mildly prominent in the head but normal in caliber distally, similar going back to 2019 Spleen: Unremarkable. Adrenals/Urinary Tract: The adrenals are unremarkable. The kidneys are unremarkable, with no focal lesion, stone, hydronephrosis, or hydroureter. There is mild bladder wall thickening. The bladder is otherwise unremarkable. Stomach/Bowel: The stomach is unremarkable. There is no evidence of bowel obstruction. There is mild diffuse thickening of the colonic wall which is similar in appearance to prior studies. Vascular/Lymphatic: There is mild calcification of the arterial vasculature. The abdominal aorta is nonaneurysmal. There is no abdominal or pelvic lymphadenopathy. Reproductive: The uterus and adnexa are unremarkable. Other: There is scattered ascites in the abdomen and pelvis, similar to recent prior studies. There is no free intraperitoneal air. There is diffuse anasarca. Scarring in the subcutaneous fat of the right gluteal region related to a prior abscess drain is similar to the prior study. No patent fistula is identified. Musculoskeletal: There is no acute osseous abnormality or aggressive osseous lesion. IMPRESSION: 1. Overall, no significant interval change since 12/03/2020. 2. Mild diffuse colonic wall thickening may reflect third-spacing (favored), but could also reflect nonspecific colitis. 3. Diffuse anasarca and scattered small volume ascites also suggest third spacing. 4. Mild bladder wall thickening can be seen with cystitis. Correlate with urinalysis. Electronically Signed   By: Valetta Mole M.D.   On: 12/20/2020 14:51     ------------------------------------------------------------------------------------------------------ Assessment/Plan: Active Problems:   Intractable nausea and vomiting  Intractable nausea, vomiting -Probably related to gastroparesis and her continued use of marijuana.   -She was observed in the ED for several hours and  continued to have vomiting.   -Supportive care with IV fluid, antiemetics.  Minimize use of pain medicines -Home meds include Reglan 5 mg 3 times daily, Zofran 4 mg 3 times daily as needed.  I would continue Reglan and IV Zofran as needed at this time.  Type 1 diabetes mellitus -A1c 7.2 on 8/16 -Home meds include Lantus 5 units daily, Humalog 3 times daily. -I will start on sliding scale insulin at this time. -Continue Accu-Cheks. Recent Labs  Lab 12/15/20 1142 12/15/20 1612 12/15/20 2030 12/16/20 0759 12/20/20 0643  GLUCAP 179* 189* 99 194* 217*   Essential hypertension -Home meds include Amlodipine 10 mg daily, Coreg 12.5 mg twice daily, hydralazine 50 mg 4 times a day, -Continue the same.  CKD 3B -Baseline creatinine less than 2.  Creatinine remains with baseline today. Recent Labs    12/07/20 0520 12/08/20 0227 12/09/20 0905 12/10/20 0435 12/12/20 2235 12/13/20 0500 12/14/20 0048 12/15/20 0058 12/16/20 0110 12/20/20 0820  BUN 24* 28* 35* 42* 61* 62* 64* 52* 49* 23*  CREATININE 1.66* 1.64* 1.77* 1.93* 2.50* 2.48* 2.43* 1.93* 1.73* 1.28*   Anemia of chronic disease -Baseline hemoglobin less than 10.   -Continue iron sulfate, Protonix 40 mg daily, Recent Labs    12/08/20 0227 12/09/20 0905 12/10/20 0435 12/12/20 2235 12/13/20 0333 12/13/20 0500 12/14/20 0048 12/15/20 0058 12/16/20 0110 12/20/20 1236  HGB 7.6* 7.9* 7.0*  7.2* 7.5* 6.8* 8.7* 9.6* 9.2* 11.0*   ?  History of seizure disorder -At home, patient is on Keppra 500 mg twice daily, Ativan 0.5 mg twice daily.  Continue same.  GERD: Continue Protonix 40 mg p.o. twice daily and Carafate 1 g p.o. q6h prn  Depression/anxiety:  -Continue BuSpar 5 mg p.o. 3 times daily.   Chronic pain/fibromyalgia -Flexeril 10 mg 3 times daily, Neurontin 200 mg 3 times daily   Mobility: Wheelchair-bound Code Status:   Code Status: Prior  DVT prophylaxis: Lovenox subcu Antimicrobials: None Fluid: Normal  saline  Diet: Clear liquid diet Diet Order     None        Consultants: None Family Communication: None at bedside    Dispo: The patient is from: Home              Anticipated d/c is to: Home              Anticipated d/c date is: 1 day  ------------------------------------------------------------------------------------- Severity of Illness: The appropriate patient status for this patient is OBSERVATION. Observation status is judged to be reasonable and necessary in order to provide the required intensity of service to ensure the patient's safety. The patient's presenting symptoms, physical exam findings, and initial radiographic and laboratory data in the context of their medical condition is felt to place them at decreased risk for further clinical deterioration. Furthermore, it is anticipated that the patient will be medically stable for discharge from the hospital within 2 midnights of admission. The following factors support the patient status of observation.   " The patient's presenting symptoms include intractable nausea and vomiting. " The physical exam findings include abdominal tenderness. " The initial radiographic and laboratory data are CT scan did not show any obstruction.  Signed, Terrilee Croak, MD [facility] 12/20/2020

## 2020-12-20 NOTE — ED Notes (Signed)
IV team at the bedside. 

## 2020-12-20 NOTE — ED Notes (Signed)
This nurse notified by Ludwig Clarks, [place], pt was tearful and requesting pain medication, and declined CT at this time. Janeece Fitting, PA-C notified and aware.

## 2020-12-20 NOTE — ED Notes (Signed)
Pt is an extremely difficult stick. Janeece Fitting, PA-C notified and aware. CBC and CMP sent to the lab at this time however pt does not currently have patent peripheral IV access.

## 2020-12-20 NOTE — ED Triage Notes (Signed)
Pt complains of back pain, vomiting, and foot pain x 2 days.

## 2020-12-20 NOTE — ED Notes (Signed)
IV team consult placed at this time per Janeece Fitting, PA-C's written order.

## 2020-12-20 NOTE — Plan of Care (Signed)
  Problem: Education: Goal: Knowledge of General Education information will improve Description: Including pain rating scale, medication(s)/side effects and non-pharmacologic comfort measures Outcome: Progressing   Problem: Health Behavior/Discharge Planning: Goal: Ability to manage health-related needs will improve Outcome: Progressing   Problem: Clinical Measurements: Goal: Ability to maintain clinical measurements within normal limits will improve 12/20/2020 2004 by Teodoro Spray, RN Outcome: Progressing 12/20/2020 2004 by Teodoro Spray, RN Outcome: Progressing

## 2020-12-20 NOTE — ED Notes (Signed)
Hospitalist at the bedside 

## 2020-12-20 NOTE — ED Notes (Signed)
Pt to CT at this time.

## 2020-12-20 NOTE — ED Notes (Signed)
IV team at the beside. Will medicated after peripheral IV placement.

## 2020-12-20 NOTE — ED Provider Notes (Signed)
[facility] DEPT Provider Note   CSN: [id number] Arrival date & time: 12/20/20  0506     History Chief Complaint  Patient presents with   Back Pain   Foot Pain   Emesis    [name] is a 30 y.o. female.  30 y.o female with a PMH of Diabetes, HTN, Fibromyalgia presents to the ED with a chief complaint of  back pain, nausea, vomiting and toe pain.  Patient had a toe amputation at [facility].  She also reports lumbar pain which is new for her this is exacerbated with episodes of vomiting.  Also reports a subjective fever.  She reports taking some Percocet without any improvement in symptoms.  In addition she reports taking some promethazine without any improvement in the vomiting.  Denies any chest pain, shortness of breath, hematemesis.    The history is provided by the patient.  Back Pain Location:  Lumbar spine Quality:  Aching Radiates to:  Does not radiate Pain severity:  Moderate Pain is:  Same all the time Onset quality:  Gradual Duration:  2 days Timing:  Constant Progression:  Unchanged Chronicity:  New Context: emotional stress   Relieved by:  Nothing Worsened by:  Bowel movement Ineffective treatments:  Narcotics and lying down Associated symptoms: no abdominal pain, no chest pain, no fever, no headaches and no weakness   Foot Pain This is a recurrent problem. Pertinent negatives include no chest pain, no abdominal pain, no headaches and no shortness of breath.  Emesis Associated symptoms: no abdominal pain, no chills, no fever, no headaches and no sore throat        Past Medical History:  Diagnosis Date   Allergy    Anemia    Anxiety    Blood transfusion without reported diagnosis    Dec 2018   Cataract    right eye   COVID-19 03/2020   Depression    Diabetes type 1, uncontrolled ([place]) 11/14/2011   Since age 41   Fibromyalgia    Gastroparesis    GERD (gastroesophageal reflux disease)     Hypertension    Infection    UTI April 2016    Patient Active Problem List   Diagnosis Date Noted   Non-pressure chronic ulcer of other part of left foot limited to breakdown of skin ([place])    PVD (peripheral vascular disease) ([place])    Hx of BKA, right ([place])    Venous stasis of both lower extremities    Chronic diastolic CHF (congestive heart failure) ([place]) 12/13/2020   Altered mental status 12/13/2020   Agitation    Malnutrition of moderate degree 12/04/2020   Seizure ([place])    Delirium    Hypertensive emergency    PRES (posterior reversible encephalopathy syndrome)    Acute respiratory failure with hypoxia ([place]) 12/03/2020   Type 1 diabetes mellitus with other specified complication ([name]) 42/68/3419   Insomnia 11/15/2020   Iron deficiency 04/11/2020   COVID-19 virus infection 04/10/2020   Osteomyelitis ([place]) 04/10/2020   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 ([place]) 02/03/2020   AKI (acute kidney injury) ([place]) 09/29/2019   Cannabinoid hyperemesis syndrome 04/30/2019   Contraception management 08/06/2018   Diabetic gastroparesis associated with type 1 diabetes mellitus ([place]) 06/04/2018   Anxiety 05/31/2018   Peripheral edema 05/31/2018   Chronic anemia 07/10/2017   GERD (gastroesophageal reflux disease) 06/23/2017   MDD (major depressive disorder), recurrent episode, mild ([place]) 02/28/2017   Severe protein-calorie  malnutrition ([place]) 04/28/2013   Hypertension associated with diabetes ([place]) 11/14/2011    Past Surgical History:  Procedure Laterality Date   AMPUTATION Right 04/13/2020   Procedure: 1ST RAY AMPUTATION RIGHT FOOT;  Surgeon: Newt Minion, MD;  Location: [facility];  Service: Orthopedics;  Laterality: Right;   AMPUTATION Right 06/06/2020   Procedure: RIGHT BELOW KNEE AMPUTATION;  Surgeon: Newt Minion, MD;  Location: [facility];  Service: Orthopedics;  Laterality: Right;   AMPUTATION Left    great toe   ANKLE SURGERY     APPLICATION OF WOUND VAC Right 06/06/2020    Procedure: APPLICATION OF WOUND VAC;  Surgeon: Newt Minion, MD;  Location: [facility];  Service: Orthopedics;  Laterality: Right;   CHOLECYSTECTOMY  11/15/2011   Procedure: LAPAROSCOPIC CHOLECYSTECTOMY WITH INTRAOPERATIVE CHOLANGIOGRAM;  Surgeon: [name], MD;  Location: [facility];  Service: General;  Laterality: N/A;   COLONOSCOPY     COLONOSCOPY WITH PROPOFOL N/A 06/27/2017   Procedure: COLONOSCOPY WITH PROPOFOL;  Surgeon: Milus Banister, MD;  [facility];  Service: Endoscopy;  Laterality: N/A;   ESOPHAGOGASTRODUODENOSCOPY  12/03/2011   Procedure: ESOPHAGOGASTRODUODENOSCOPY (EGD);  Surgeon: Beryle Beams, MD;  Location: [facility];  Service: Endoscopy;  Laterality: N/A;   FLEXIBLE SIGMOIDOSCOPY N/A 03/10/2017   Procedure: FLEXIBLE SIGMOIDOSCOPY;  Surgeon: Carol Ada, MD;  [facility];  Service: Endoscopy;  Laterality: N/A;   INCISION AND DRAINAGE PERIRECTAL ABSCESS N/A 03/01/2017   Procedure: IRRIGATION AND DEBRIDEMENT PERIRECTAL ABSCESS;  Surgeon: Alphonsa Overall, MD;  Location: [facility];  Service: General;  Laterality: N/A;   IRRIGATION AND DEBRIDEMENT BUTTOCKS N/A 03/23/2017   Procedure: IRRIGATION AND DEBRIDEMENT BUTTOCKS, SETON PLACEMENT;  Surgeon: [name], MD;  Location: [facility];  Service: General;  Laterality: N/A;   LAPAROSCOPY  11/23/2011   Procedure: LAPAROSCOPY DIAGNOSTIC;  Surgeon: Edward Jolly, MD;  Location: [facility];  Service: General;  Laterality: N/A;   SIGMOIDOSCOPY     UPPER GASTROINTESTINAL ENDOSCOPY     WISDOM TOOTH EXTRACTION       OB History     Gravida  1   Para  1   Term      Preterm  1   AB      Living  1      SAB      IAB      Ectopic      Multiple  0   Live Births  1           Family History  Problem Relation Age of Onset   Diabetes Mother    Hypertension Father    Colon cancer Paternal Grandmother        pt thinks PGM was dx in her 25's   Diabetes Paternal Grandmother    Diabetes Maternal  Grandmother    Diabetes Maternal Grandfather    Diabetes Paternal Grandfather    Diabetes Other    Breast cancer Paternal Aunt    Esophageal cancer Neg Hx    Liver cancer Neg Hx    Pancreatic cancer Neg Hx    Stomach cancer Neg Hx    Rectal cancer Neg Hx     Social History   Tobacco Use   Smoking status: Never   Smokeless tobacco: Never  Vaping Use   Vaping Use: Never used  Substance Use Topics   Alcohol use: No    Alcohol/week: 0.0 standard drinks   Drug use: Not Currently    Types: Marijuana  Home Medications Prior to Admission medications   Medication Sig Start Date End Date Taking? Authorizing Provider  acetaminophen (TYLENOL) 500 MG tablet Take 1,000 mg by mouth every 6 (six) hours as needed for moderate pain or headache.   Yes [provider]  Albuterol Sulfate (PROAIR RESPICLICK) 161 (90 Base) MCG/ACT AEPB Inhale 2 puffs into the lungs daily as needed. Patient taking differently: Inhale 2 puffs into the lungs daily as needed (wheezing). 11/08/20 02/02/21 Yes Allwardt, Alyssa M, PA-C  amLODipine (NORVASC) 10 MG tablet Take 1 tablet (10 mg total) by mouth daily. 11/08/20  Yes Allwardt, Alyssa M, PA-C  busPIRone (BUSPAR) 5 MG tablet Take 1 tablet (5 mg total) by mouth 3 (three) times daily. 02/03/20  Yes [name], MD  carvedilol (COREG) 12.5 MG tablet Take 1 tablet (12.5 mg total) by mouth 2 (two) times daily with a meal. 04/16/20 02/02/21 Yes Dahal, Marlowe Aschoff, MD  cyclobenzaprine (FLEXERIL) 10 MG tablet Take 1 tablet (10 mg total) by mouth 3 (three) times daily as needed for muscle spasms. 11/15/20  Yes [name], MD  dicyclomine (BENTYL) 20 MG tablet Take 1 tablet (20 mg total) by mouth every 8 (eight) hours as needed for spasms. 11/25/19  Yes Petrucelli, Samantha R, PA-C  EPINEPHrine 0.3 mg/0.3 mL IJ SOAJ injection Inject 0.3 mg into the muscle once as needed (anaphylaxis/allergic reaction). Patient taking differently: Inject 0.3 mg into the muscle once as  needed for anaphylaxis. 07/16/20  Yes [name], MD  ferrous sulfate 325 (65 FE) MG tablet Take 1 tablet (325 mg total) by mouth 2 (two) times daily with a meal. 12/16/20  Yes Thurnell Lose, MD  folic acid (FOLVITE) 1 MG tablet Take 1 tablet (1 mg total) by mouth daily. 12/16/20  Yes Thurnell Lose, MD  gabapentin (NEURONTIN) 100 MG capsule Take 200 mg by mouth 3 (three) times daily. 11/02/20  Yes [provider]  hydrALAZINE (APRESOLINE) 50 MG tablet Take 50 mg by mouth 4 (four) times daily. 09/14/20  Yes [provider]  insulin glargine (LANTUS) 100 UNIT/ML injection Inject 0.05 mLs (5 Units total) into the skin daily. 12/10/20  Yes Sheikh, Omair Latif, DO  insulin lispro (HUMALOG) 100 UNIT/ML injection Inject 0-5 Units into the skin 3 (three) times daily before meals. Sliding scale   Yes [provider]  levETIRAcetam (KEPPRA) 500 MG tablet Take 1 tablet (500 mg total) by mouth 2 (two) times daily. 12/10/20  Yes Sheikh, Omair Latif, DO  loperamide (IMODIUM) 2 MG capsule Take 6 mg by mouth daily as needed for diarrhea or loose stools.    Yes [provider]  LORazepam (ATIVAN) 0.5 MG tablet Take 1 tablet (0.5 mg total) by mouth 2 (two) times daily. 11/15/20  Yes [name], MD  metoCLOPramide (REGLAN) 5 MG tablet Take 1 tablet (5 mg total) by mouth 3 (three) times daily before meals. Please make and keep an appointment for further refills. 11/13/20 02/11/21 Yes [place] Donnamarie Poag, DO  Nutritional Supplements (,FEEDING SUPPLEMENT, PROSOURCE PLUS) liquid Take 30 mLs by mouth 2 (two) times daily between meals. 12/10/20  Yes Sheikh, Omair Latif, DO  ondansetron (ZOFRAN ODT) 4 MG disintegrating tablet Take 1 tablet (4 mg total) by mouth every 8 (eight) hours as needed for nausea or vomiting. 11/08/20  Yes Allwardt, Alyssa M, PA-C  pantoprazole (PROTONIX) 40 MG tablet Take 1 tablet (40 mg total) by mouth daily. 12/16/20  Yes Thurnell Lose, MD  traZODone ([place])  50 MG tablet Take 0.5-1 tablets (25-50 mg total) by mouth at bedtime as needed for sleep. 11/15/20  Yes [name], MD  blood glucose meter kit and supplies by Other route. Check blood sugar 3-4 times    [provider]  Carboxymethylcell-Hypromellose (GENTEAL) 0.25-0.3 % GEL Place 1 drop into both eyes daily as needed (dryeyes).    [provider]  Continuous Blood Gluc Sensor (DEXCOM G6 SENSOR) MISC USE AS DIRECTED EVERY 10 DAYS 07/28/20   Renato Shin, MD  Continuous Blood Gluc Transmit (DEXCOM G6 TRANSMITTER) MISC USE AS DIRECTED TO  MONITOR  GLUCOSE  LEVELS 05/10/20   Renato Shin, MD  Insulin Pen Needle 32G X 4 MM MISC USE AS DIRECTED WITH INSULIN PENS 05/24/20 05/24/21  Mercy Riding, MD  Ipratropium-Albuterol (COMBIVENT) 20-100 MCG/ACT AERS respimat Inhale 1 puff into the lungs every 6 (six) hours as needed for wheezing or shortness of breath. 12/10/20   Raiford Noble Latif, DO  vitamin B-12 1000 MCG tablet Take 1 tablet (1,000 mcg total) by mouth daily. 12/16/20   Thurnell Lose, MD  zinc sulfate 220 (50 Zn) MG capsule Take 1 capsule (220 mg total) by mouth daily. Patient not taking: No sig reported 12/10/20   Raiford Noble Latif, DO  potassium chloride SA (KLOR-CON) 20 MEQ tablet Take 1 tablet (20 mEq total) by mouth daily for 5 doses. 07/04/19 07/30/19  Janeece Fitting, PA-C    Allergies    Other and Lactose intolerance (gi)  Review of Systems   Review of Systems  Constitutional:  Negative for chills and fever.  HENT:  Negative for sore throat.   Respiratory:  Negative for shortness of breath.   Cardiovascular:  Negative for chest pain.  Gastrointestinal:  Positive for nausea and vomiting. Negative for abdominal pain.  Genitourinary:  Negative for flank pain.  Musculoskeletal:  Positive for back pain.  Neurological:  Negative for weakness, light-headedness and headaches.  All other systems reviewed and are negative.  Physical Exam Updated Vital Signs BP (!)  180/142 (BP Location: Left Arm)   Pulse (!) 118   Temp 97.8 F (36.6 C) (Oral)   Resp 20   Ht 5' 7"  (1.702 m)   Wt 72.6 kg   SpO2 100%   BMI 25.06 kg/m   Physical Exam Vitals and nursing note reviewed.  Constitutional:      Appearance: Normal appearance. She is ill-appearing.     Comments: Teary on exam  HENT:     Head: Normocephalic and atraumatic.     Mouth/Throat:     Mouth: Mucous membranes are moist.  Eyes:     Pupils: Pupils are equal, round, and reactive to light.  Cardiovascular:     Rate and Rhythm: Tachycardia present.  Pulmonary:     Effort: Pulmonary effort is normal.     Breath sounds: No wheezing.  Abdominal:     General: Abdomen is flat.     Tenderness: There is abdominal tenderness.     Comments: Abdomen is soft, generalized pain without any focal point of tenderness.    Musculoskeletal:     Cervical back: Normal range of motion and neck supple.  Skin:    General: Skin is warm and dry.  Neurological:     Mental Status: She is alert and oriented to person, place, and time.    ED Results / Procedures / Treatments   Labs (all labs ordered are listed, but only abnormal results are displayed) Labs Reviewed  RAPID  URINE DRUG SCREEN, HOSP PERFORMED - Abnormal; Notable for the following components:      Result Value   Opiates POSITIVE (*)    Tetrahydrocannabinol POSITIVE (*)    All other components within normal limits  URINALYSIS, ROUTINE W REFLEX MICROSCOPIC - Abnormal; Notable for the following components:   Glucose, UA >=500 (*)    Hgb urine dipstick LARGE (*)    Protein, ur >300 (*)    All other components within normal limits  COMPREHENSIVE METABOLIC PANEL - Abnormal; Notable for the following components:   CO2 21 (*)    Glucose, Bld 232 (*)    BUN 23 (*)    Creatinine, Ser 1.28 (*)    Albumin 2.8 (*)    Alkaline Phosphatase 157 (*)    GFR, Estimated 57 (*)    All other components within normal limits  CBC WITH DIFFERENTIAL/PLATELET -  Abnormal; Notable for the following components:   RBC 3.83 (*)    Hemoglobin 11.0 (*)    HCT 34.1 (*)    All other components within normal limits  CBG MONITORING, ED - Abnormal; Notable for the following components:   Glucose-Capillary 217 (*)    All other components within normal limits  PREGNANCY, URINE  URINALYSIS, MICROSCOPIC (REFLEX)  CBC WITH DIFFERENTIAL/PLATELET    EKG None  Radiology No results found.  Procedures Procedures   Medications Ordered in ED Medications  hydrALAZINE (APRESOLINE) tablet 50 mg (50 mg Oral Given 12/20/20 1026)  promethazine (PHENERGAN) 12.5 mg in sodium chloride 0.9 % 50 mL IVPB (has no administration in time range)  carvedilol (COREG) tablet 12.5 mg (12.5 mg Oral Given 12/20/20 1026)  sodium chloride 0.9 % bolus 1,000 mL (1,000 mLs Intravenous New Bag/Given 12/20/20 1024)  HYDROmorphone (DILAUDID) injection 1 mg (1 mg Intravenous Given 12/20/20 1027)  prochlorperazine (COMPAZINE) injection 10 mg (10 mg Intravenous Given 12/20/20 1345)  HYDROmorphone (DILAUDID) injection 1 mg (1 mg Intravenous Given 12/20/20 1345)    ED Course  I have reviewed the triage vital signs and the nursing notes.  Pertinent labs & imaging results that were available during my care of the patient were reviewed by me and considered in my medical decision making (see chart for details).  Clinical Course as of 12/20/20 1402  Thu Dec 20, 2020  1303 Opiates(!): POSITIVE [JS]  1303 Tetrahydrocannabinol(!): POSITIVE [JS]    Clinical Course User Index [JS] Janeece Fitting, PA-C   MDM Rules/Calculators/A&P   Patient with extensive past medical history presents to the ED multiple complaints including back pain, toe pain, nausea and vomiting.  She has attempted to take her promethazine at home along with a Percocet for pain control without much improvement in symptoms.  Pain is described as a sharp sensation to the lumbar spine without any radiation.  Patient does have a history  of right BKA.  Recent left foot toe amputation.  She has extensive medical history including a recent admission on December 12, 2020 for altered mental status.  Patient does have recurrent episodes of COVID-19.  Today's visit, patient is overall nontoxic in appearance, writhing in pain stating "it hurts so bad ".  Pain with palpation along the lumbar spine.  No prior history of IV drug use per patient.  No bowel or bladder incontinence. She does not have any urinary symptoms but does have a prior history of CKD 3 and does make urine.  Cording to extensive chart review and previous records patient was admitted and had VQ scan to rule  out pulmonary embolism post-COVID infection earlier this month this was negative even though her D-dimer level was found to be greater than 20 on arrival.  Last visit she was found to be unresponsive by her husband, EMS was called patient saturations were around 76%.  Altered mental status because in the past might have been due to Percocet OD, hypercapnic failure.  Position of today's visit reveals CBC with no leukocytosis, hemoglobin is slightly decreased at 11.  CMP with a creatinine level that is improved from her previous hospital admission at 1.2, albumin is 0.8 with some improvement with her previous.  UA with large protein, large hematuria.  Trace of leukocytes negative.  This is negative.  UDS positive for opioids along with THC, she was given Dilaudid in the ED for pain control she reports her pain was very severe in nature.  She was also given Phenergan, blood pressure medication such as Coreg, hydralazine which she did not take this morning prior to arrival.  Given a second round of Compazine along with more Dilaudid without any improvement in her symptoms.  She was monitored in the ED for approximately 8 hours without controlling her symptoms.  I feel there is reasonable to admit patient for non-intractable vomiting at this time.   Continues to be persistently  tachycardic with a heart rate in the 112's, negative VQ scan during her last visit, no signs of hypoxia on today's visit I have a lower suspicion for any pulmonary component at this time.  CT abdomen scan obtained without any acute findings.  Does have prior history of gastroparesis, I suspect likely recurrence of this.  We will also obtain CT to rule out any acute abdominal findings although her exam remains nonfocal.  2:01 PM spoke to hospitalist service who will admit patient for further management at this time.  She remains hemodynamically stable although with tachycardia noted as above.  Portions of this note were generated with Lobby[employer]. Dictation errors may occur despite best attempts at proofreading.  Final Clinical Impression(s) / ED Diagnoses Final diagnoses:  Intractable vomiting with nausea, unspecified vomiting type    Rx / DC Orders ED Discharge Orders     None        Janeece Fitting, PA-C 12/20/20 1402    Ezequiel Essex, MD 12/20/20 [id number]

## 2020-12-21 ENCOUNTER — Telehealth: Payer: Self-pay

## 2020-12-21 DIAGNOSIS — R112 Nausea with vomiting, unspecified: Secondary | ICD-10-CM | POA: Diagnosis not present

## 2020-12-21 LAB — GLUCOSE, CAPILLARY
Glucose-Capillary: 84 mg/dL (ref 70–99)
Glucose-Capillary: 93 mg/dL (ref 70–99)

## 2020-12-21 LAB — BASIC METABOLIC PANEL
Anion gap: 7 (ref 5–15)
BUN: 22 mg/dL — ABNORMAL HIGH (ref 6–20)
CO2: 22 mmol/L (ref 22–32)
Calcium: 8.1 mg/dL — ABNORMAL LOW (ref 8.9–10.3)
Chloride: 106 mmol/L (ref 98–111)
Creatinine, Ser: 1.21 mg/dL — ABNORMAL HIGH (ref 0.44–1.00)
GFR, Estimated: >60 mL/min (ref >60)
Glucose, Bld: 109 mg/dL — ABNORMAL HIGH (ref 70–99)
Potassium: 3.6 mmol/L (ref 3.5–5.1)
Sodium: 135 mmol/L (ref 135–145)

## 2020-12-21 LAB — CBC
HCT: 27.8 % — ABNORMAL LOW (ref 36.0–46.0)
Hemoglobin: 9.3 g/dL — ABNORMAL LOW (ref 12.0–15.0)
MCH: 29.3 pg (ref 26.0–34.0)
MCHC: 33.5 g/dL (ref 30.0–36.0)
MCV: 87.7 fL (ref 80.0–100.0)
Platelets: 157 10*3/uL (ref 150–400)
RBC: 3.17 MIL/uL — ABNORMAL LOW (ref 3.87–5.11)
RDW: 13.5 % (ref 11.5–15.5)
WBC: 4.4 10*3/uL (ref 4.0–10.5)
nRBC: 0 % (ref 0.0–0.2)

## 2020-12-21 NOTE — Telephone Encounter (Signed)
LAST APPOINTMENT DATE:  11/15/20  NEXT APPOINTMENT DATE: 12/26/20  MEDICATION::oxyCODONE-acetaminophen (PERCOCET) 10-325 MG tablet    PHARMACY:[facility] [place], [place] - [address] AT [place]

## 2020-12-21 NOTE — Discharge Summary (Signed)
Physician Discharge Summary  [name] MGQ:[id number] DOB: 1990-05-03 DOA: 12/20/2020  PCP: [name], MD  Admit date: 12/20/2020 Discharge date: 12/21/2020  Admitted From: Home Discharge disposition: Home   Code Status: Full Code  Diet Recommendation: Diabetic diet  Discharge Diagnosis:   Active Problems:   Intractable nausea and vomiting   History of Present Illness / Brief narrative:  [name] is a 30 y.o. female with PMH significant for DM1, HTN, CKD 3A,  s/p right transtibial amputation, depression/anxiety, fibromyalgia, chronic anemia, GERD, gastroparesis who has been hospitalized multiple times in the past for intractable nausea, vomiting attributed to gastroparesis and her continued use of marijuana. Her recent admission was a week ago from 9/14-9/18 after she was found unresponsive by due to accidental Percocet overdose. Patient presented to the ED today with complaint of back pain, vomiting and foot pain for 2 days.  She has associated lower back pain probably because of persistent retching.  Symptoms did not improve with Percocet and Phenergan at home and hence presented to the ED. CT abdomen and pelvis was obtained which did not show any significant change compared to CT scan from 12/03/2020 She was monitored for several hours in the ED and was witnessed to have vomiting and hence observation under hospitalist service was requested.    Subjective:  Seen and examined this morning.  Feels much better.  Able to tolerate diet.  Vital signs improving.  Electrolytes stable.  Ready to go home.  Hospital Course:  Intractable nausea, vomiting -Probably related to gastroparesis and her continued use of marijuana.   -She was observed in the ED for several hours and continued to have vomiting.  She was kept in observation.  Clinically improved with minimal use of medications.  Able to tolerate diet and wants to go home today.   Type 1 diabetes mellitus -A1c 7.2 on  8/16 -Home meds include Lantus 5 units daily, Humalog 3 times daily.  Continue the same regimen at home.  Essential hypertension -Home meds include Amlodipine 10 mg daily, Coreg 12.5 mg twice daily, hydralazine 50 mg 4 times a day, -Continue the same at home.   CKD 3B -Baseline creatinine less than 2.  Creatinine remains in baseline.    Anemia of chronic disease -Baseline hemoglobin less than 10.   -Continue iron sulfate, Protonix 40 mg daily.  History of seizure disorder -At home, patient is on Keppra 500 mg twice daily, Ativan 0.5 mg twice daily.  Continue same.   GERD -Continue Protonix 40 mg p.o. twice daily and Carafate 1 g p.o. q6h prn   Depression/anxiety:  -Continue BuSpar 5 mg p.o. 3 times daily.    Chronic pain/fibromyalgia -Flexeril 10 mg 3 times daily, Neurontin 200 mg 3 times daily  Stable for discharge today.   Allergies as of 12/21/2020       Reactions   Other Anaphylaxis   Reaction to [place] nuts   Lactose Intolerance (gi) Diarrhea        Medication List     TAKE these medications    (feeding supplement) PROSource Plus liquid Take 30 mLs by mouth 2 (two) times daily between meals.   acetaminophen 500 MG tablet Commonly known as: TYLENOL Take 1,000 mg by mouth every 6 (six) hours as needed for moderate pain or headache.   Albuterol Sulfate 108 (90 Base) MCG/ACT Aepb Commonly known as: PROAIR RESPICLICK Inhale 2 puffs into the lungs daily as needed. What changed: reasons to take this  amLODipine 10 MG tablet Commonly known as: NORVASC Take 1 tablet (10 mg total) by mouth daily.   blood glucose meter kit and supplies by Other route. Check blood sugar 3-4 times   busPIRone 5 MG tablet Commonly known as: BUSPAR Take 1 tablet (5 mg total) by mouth 3 (three) times daily.   carvedilol 12.5 MG tablet Commonly known as: COREG Take 1 tablet (12.5 mg total) by mouth 2 (two) times daily with a meal.   cyanocobalamin 1000 MCG tablet Take 1  tablet (1,000 mcg total) by mouth daily.   cyclobenzaprine 10 MG tablet Commonly known as: FLEXERIL Take 1 tablet (10 mg total) by mouth 3 (three) times daily as needed for muscle spasms.   Dexcom G6 Sensor Misc USE AS DIRECTED EVERY 10 DAYS   Dexcom G6 Transmitter Misc USE AS DIRECTED TO  MONITOR  GLUCOSE  LEVELS   dicyclomine 20 MG tablet Commonly known as: BENTYL Take 1 tablet (20 mg total) by mouth every 8 (eight) hours as needed for spasms.   EPINEPHrine 0.3 mg/0.3 mL Soaj injection Commonly known as: EPI-PEN Inject 0.3 mg into the muscle once as needed (anaphylaxis/allergic reaction). What changed: reasons to take this   ferrous sulfate 325 (65 FE) MG tablet Take 1 tablet (325 mg total) by mouth 2 (two) times daily with a meal.   folic acid 1 MG tablet Commonly known as: FOLVITE Take 1 tablet (1 mg total) by mouth daily.   gabapentin 100 MG capsule Commonly known as: NEURONTIN Take 200 mg by mouth 3 (three) times daily.   GenTeal 0.25-0.3 % Gel Generic drug: Carboxymethylcell-Hypromellose Place 1 drop into both eyes daily as needed (dryeyes).   hydrALAZINE 50 MG tablet Commonly known as: APRESOLINE Take 50 mg by mouth 4 (four) times daily.   insulin glargine 100 UNIT/ML injection Commonly known as: LANTUS Inject 0.05 mLs (5 Units total) into the skin daily.   insulin lispro 100 UNIT/ML injection Commonly known as: HUMALOG Inject 0-5 Units into the skin 3 (three) times daily before meals. Sliding scale   Ipratropium-Albuterol 20-100 MCG/ACT Aers respimat Commonly known as: COMBIVENT Inhale 1 puff into the lungs every 6 (six) hours as needed for wheezing or shortness of breath.   levETIRAcetam 500 MG tablet Commonly known as: KEPPRA Take 1 tablet (500 mg total) by mouth 2 (two) times daily.   loperamide 2 MG capsule Commonly known as: IMODIUM Take 6 mg by mouth daily as needed for diarrhea or loose stools.   LORazepam 0.5 MG tablet Commonly known as:  ATIVAN Take 1 tablet (0.5 mg total) by mouth 2 (two) times daily.   metoCLOPramide 5 MG tablet Commonly known as: REGLAN Take 1 tablet (5 mg total) by mouth 3 (three) times daily before meals. Please make and keep an appointment for further refills.   ondansetron 4 MG disintegrating tablet Commonly known as: Zofran ODT Take 1 tablet (4 mg total) by mouth every 8 (eight) hours as needed for nausea or vomiting.   pantoprazole 40 MG tablet Commonly known as: PROTONIX Take 1 tablet (40 mg total) by mouth daily.   PenTips 32G X 4 MM Misc Generic drug: Insulin Pen Needle USE AS DIRECTED WITH INSULIN PENS   traZODone 50 MG tablet Commonly known as: DESYREL Take 0.5-1 tablets (25-50 mg total) by mouth at bedtime as needed for sleep.   zinc sulfate 220 (50 Zn) MG capsule Take 1 capsule (220 mg total) by mouth daily.  Discharge Care Instructions  (From admission, onward)           Start     Ordered   12/21/20 0000  Discharge wound care:       Comments: Follow-up with podiatry, orthopedic surgeon   12/21/20 1146            Discharge Instructions:  Follow with Primary MD [name], MD in 7 days   Get CBC/BMP checked in next visit within 1 week by PCP or SNF MD ( we routinely change or add medications that can affect your baseline labs and fluid status, therefore we recommend that you get the mentioned basic workup next visit with your PCP, your PCP may decide not to get them or add new tests based on their clinical decision)  On your next visit with your PCP, please Get Medicines reviewed and adjusted.  Please request your PCP  to go over all Hospital Tests and Procedure/Radiological results at the follow up, please get all Hospital records sent to your Prim MD by signing hospital release before you go home.  Activity: As tolerated with Full fall precautions use walker/cane & assistance as needed  For Heart failure patients - Check your Weight  same time everyday, if you gain over 2 pounds, or you develop in leg swelling, experience more shortness of breath or chest pain, call your Primary MD immediately. Follow Cardiac Low Salt Diet and 1.5 lit/day fluid restriction.  If you have smoked or chewed Tobacco in the last 2 yrs please stop smoking, stop any regular Alcohol  and or any Recreational drug use.  If you experience worsening of your admission symptoms, develop shortness of breath, life threatening emergency, suicidal or homicidal thoughts you must seek medical attention immediately by calling 911 or calling your MD immediately  if symptoms less severe.  You Must read complete instructions/literature along with all the possible adverse reactions/side effects for all the Medicines you take and that have been prescribed to you. Take any new Medicines after you have completely understood and accpet all the possible adverse reactions/side effects.   Do not drive, operate heavy machinery, perform activities at heights, swimming or participation in water activities or provide baby sitting services if your were admitted for syncope or siezures until you have seen by Primary MD or a Neurologist and advised to do so again.  Do not drive when taking Pain medications.  Do not take more than prescribed Pain, Sleep and Anxiety Medications  Wear Seat belts while driving.   Please note You were cared for by a hospitalist during your hospital stay. If you have any questions about your discharge medications or the care you received while you were in the hospital after you are discharged, you can call the unit and asked to speak with the hospitalist on call if the hospitalist that took care of you is not available. Once you are discharged, your primary care physician will handle any further medical issues. Please note that NO REFILLS for any discharge medications will be authorized once you are discharged, as it is imperative that you return to your  primary care physician (or establish a relationship with a primary care physician if you do not have one) for your aftercare needs so that they can reassess your need for medications and monitor your lab values.    Follow ups:   Discharge Instructions     Diet Carb Modified   Complete by: As directed    Discharge wound care:  Complete by: As directed    Follow-up with podiatry, orthopedic surgeon   Increase activity slowly   Complete by: As directed        Follow-up Information     [name], MD Follow up.   Specialty: Family Medicine Contact information: [place] [address] [phone]                 Wound care:   Pressure Injury 12/13/20 Foot Left;Lateral Unstageable - Full thickness tissue loss in which the base of the injury is covered by slough (yellow, tan, gray, green or brown) and/or eschar (tan, brown or black) in the wound bed. (Active)  Date First Assessed/Time First Assessed: 12/13/20 1600   Location: Foot  Location Orientation: Left;Lateral  Staging: Unstageable - Full thickness tissue loss in which the base of the injury is covered by slough (yellow, tan, gray, green or brown) and...    Assessments 12/13/2020  4:30 PM 12/21/2020  8:17 AM  Dressing -- Clean;Dry;Intact  % Wound base Yellow/Fibrinous Exudate 10% --  % Wound base Black/Eschar 90% --  Drainage Amount None --     No Linked orders to display     Wound / Incision (Open or Dehisced) 12/13/20 Other (Comment) Other (Comment) Anterior;Left Callus (Active)  Date First Assessed/Time First Assessed: 12/13/20 1600   Wound Type: Other (Comment)  Location: (c) Other (Comment)  Location Orientation: Anterior;Left  Wound Description (Comments): Callus  Present on Admission: Yes    Assessments 12/15/2020  8:15 AM  Dressing Type Foam - Lift dressing to assess site every shift;Other (Comment)  Dressing Changed Changed  Dressing Status Clean;Dry;Intact  Dressing Change Frequency  Monday, Wednesday, Friday  Drainage Amount None     No Linked orders to display    Discharge Exam:   Vitals:   12/20/20 1932 12/20/20 2102 12/21/20 0037 12/21/20 0605  BP: (!) 179/106 (!) 147/94 (!) 148/91 (!) 162/89  Pulse: 92  85 93  Resp:   16 18  Temp: 98.4 F (36.9 C)  97.6 F (36.4 C) 98.6 F (37 C)  TempSrc: Oral  Oral Oral  SpO2: 100%  100% 100%  Weight:      Height:        Body mass index is 25.06 kg/m.  General exam: Pleasant, young African-American female.  Not in distress Skin: No rashes, lesions or ulcers. HEENT: Atraumatic, normocephalic, no obvious bleeding Lungs: Clear to auscultation bilaterally CVS: Regular rate and rhythm, no murmur GI/Abd soft, nontender, nondistended, bowel sound present CNS: Alert, awake and oriented x3 Psychiatry: Mood appropriate Extremities: No pedal edema, no calf tenderness  Time coordinating discharge: 35 minutes   The results of significant diagnostics from this hospitalization (including imaging, microbiology, ancillary and laboratory) are listed below for reference.    Procedures and Diagnostic Studies:   CT ABDOMEN PELVIS WO CONTRAST  Result Date: 12/20/2020 CLINICAL DATA:  Abdominal pain EXAM: CT ABDOMEN AND PELVIS WITHOUT CONTRAST TECHNIQUE: Multidetector CT imaging of the abdomen and pelvis was performed following the standard protocol without IV contrast. COMPARISON:  CT abdomen/pelvis 12/23/2020 FINDINGS: Lower chest: There is no focal consolidation or pleural effusion in the lung bases. The imaged heart is unremarkable. Hepatobiliary: The liver is unremarkable. The gallbladder is surgically absent. There is no biliary ductal dilatation. Pancreas: The pancreas is grossly unremarkable, within the confines of noncontrast technique. No definite focal lesions or contour abnormalities are seen. The main pancreatic duct is mildly prominent in the head but normal in caliber  distally, similar going back to 2019 Spleen:  Unremarkable. Adrenals/Urinary Tract: The adrenals are unremarkable. The kidneys are unremarkable, with no focal lesion, stone, hydronephrosis, or hydroureter. There is mild bladder wall thickening. The bladder is otherwise unremarkable. Stomach/Bowel: The stomach is unremarkable. There is no evidence of bowel obstruction. There is mild diffuse thickening of the colonic wall which is similar in appearance to prior studies. Vascular/Lymphatic: There is mild calcification of the arterial vasculature. The abdominal aorta is nonaneurysmal. There is no abdominal or pelvic lymphadenopathy. Reproductive: The uterus and adnexa are unremarkable. Other: There is scattered ascites in the abdomen and pelvis, similar to recent prior studies. There is no free intraperitoneal air. There is diffuse anasarca. Scarring in the subcutaneous fat of the right gluteal region related to a prior abscess drain is similar to the prior study. No patent fistula is identified. Musculoskeletal: There is no acute osseous abnormality or aggressive osseous lesion. IMPRESSION: 1. Overall, no significant interval change since 12/03/2020. 2. Mild diffuse colonic wall thickening may reflect third-spacing (favored), but could also reflect nonspecific colitis. 3. Diffuse anasarca and scattered small volume ascites also suggest third spacing. 4. Mild bladder wall thickening can be seen with cystitis. Correlate with urinalysis. Electronically Signed   By: Valetta Mole M.D.   On: 12/20/2020 14:51   DG Chest 2 View  Result Date: 12/20/2020 CLINICAL DATA:  Pain. EXAM: CHEST - 2 VIEW COMPARISON:  Chest x-ray 12/12/2020. FINDINGS: There is some faint patchy opacities in the left mid lung. The lungs are otherwise clear. There is no pleural effusion or pneumothorax. The cardiomediastinal silhouette is within normal limits. The osseous structures are within normal limits. IMPRESSION: 1. Faint patchy opacities in the left mid lung may represent atelectasis or  infection. Electronically Signed   By: Ronney Asters M.D.   On: 12/20/2020 15:51     Labs:   Basic Metabolic Panel: Recent Labs  Lab 12/15/20 0058 12/16/20 0110 12/20/20 0820 12/21/20 0509  NA 135 135 140 135  K 4.4 4.7 4.4 3.6  CL 107 109 108 106  CO2 17* 16* 21* 22  GLUCOSE 148* 127* 232* 109*  BUN 52* 49* 23* 22*  CREATININE 1.93* 1.73* 1.28* 1.21*  CALCIUM 8.8* 8.8* 9.2 8.1*  MG 2.1 2.0  --   --    GFR Estimated Creatinine Clearance: 65.5 mL/min (A) (by C-G formula based on SCr of 1.21 mg/dL (H)). Liver Function Tests: Recent Labs  Lab 12/15/20 0058 12/16/20 0110 12/20/20 0820  AST 35 24 23  ALT 49* 36 20  ALKPHOS 199* 174* 157*  BILITOT 0.4 0.5 0.6  PROT 6.9 7.1 7.9  ALBUMIN 2.3* 2.3* 2.8*   No results for input(s): LIPASE, AMYLASE in the last 168 hours. No results for input(s): AMMONIA in the last 168 hours. Coagulation profile No results for input(s): INR, PROTIME in the last 168 hours.  CBC: Recent Labs  Lab 12/15/20 0058 12/16/20 0110 12/20/20 1236 12/21/20 0509  WBC 7.4 6.2 7.4 4.4  NEUTROABS 5.2 4.2 5.8  --   HGB 9.6* 9.2* 11.0* 9.3*  HCT 29.7* 29.7* 34.1* 27.8*  MCV 91.7 93.1 89.0 87.7  PLT 291 304 203 157   Cardiac Enzymes: No results for input(s): CKTOTAL, CKMB, CKMBINDEX, TROPONINI in the last 168 hours. BNP: Invalid input(s): POCBNP CBG: Recent Labs  Lab 12/15/20 1612 12/15/20 2030 12/16/20 0759 12/20/20 0643 12/21/20 0836  GLUCAP 189* 99 194* 217* 84   D-Dimer No results for input(s): DDIMER in the last 72 hours.  Hgb A1c No results for input(s): HGBA1C in the last 72 hours. Lipid Profile No results for input(s): CHOL, HDL, LDLCALC, TRIG, CHOLHDL, LDLDIRECT in the last 72 hours. Thyroid function studies No results for input(s): TSH, T4TOTAL, T3FREE, THYROIDAB in the last 72 hours.  Invalid input(s): FREET3 Anemia work up No results for input(s): VITAMINB12, FOLATE, FERRITIN, TIBC, IRON, RETICCTPCT in the last 72  hours. Microbiology Recent Results (from the past 240 hour(s))  Blood Culture (routine x 2)     Status: Abnormal   Collection Time: 12/12/20 11:20 PM   Specimen: BLOOD  Result Value Ref Range Status   Specimen Description BLOOD RIGHT ANTECUBITAL  Final   Special Requests   Final    BOTTLES DRAWN AEROBIC AND ANAEROBIC Blood Culture results may not be optimal due to an inadequate volume of blood received in culture bottles   Culture  Setup Time   Final    GRAM POSITIVE COCCI IN CLUSTERS IN BOTH AEROBIC AND ANAEROBIC BOTTLES CRITICAL RESULT CALLED TO, READ BACK BY AND VERIFIED WITH: L TRINH,PHARMD@[id number]  12/14/20 [place]    Culture (A)  Final    STAPHYLOCOCCUS EPIDERMIDIS THE SIGNIFICANCE OF ISOLATING THIS ORGANISM FROM A SINGLE SET OF BLOOD CULTURES WHEN MULTIPLE SETS ARE DRAWN IS UNCERTAIN. PLEASE NOTIFY THE MICROBIOLOGY DEPARTMENT WITHIN ONE WEEK IF SPECIATION AND SENSITIVITIES ARE REQUIRED. Performed at [facility], [place] [address], [place], [place] [address]    Report Status 12/15/2020 FINAL  Final  Blood Culture ID Panel (Reflexed)     Status: Abnormal   Collection Time: 12/12/20 11:20 PM  Result Value Ref Range Status   Enterococcus faecalis NOT DETECTED NOT DETECTED Final   Enterococcus Faecium NOT DETECTED NOT DETECTED Final   Listeria monocytogenes NOT DETECTED NOT DETECTED Final   Staphylococcus species DETECTED (A) NOT DETECTED Final    Comment: CRITICAL RESULT CALLED TO, READ BACK BY AND VERIFIED WITH: L TRINH,PHARMD@[id number]  12/14/20 [place]    Staphylococcus aureus (BCID) NOT DETECTED NOT DETECTED Final   Staphylococcus epidermidis DETECTED (A) NOT DETECTED Final    Comment: Methicillin (oxacillin) resistant coagulase negative staphylococcus. Possible blood culture contaminant (unless isolated from more than one blood culture draw or clinical case suggests pathogenicity). No antibiotic treatment is indicated for blood  culture contaminants. CRITICAL RESULT CALLED TO, READ BACK BY  AND VERIFIED WITH: L TRINH,PHARMD@[id number]  12/14/20 [place]    Staphylococcus lugdunensis NOT DETECTED NOT DETECTED Final   Streptococcus species NOT DETECTED NOT DETECTED Final   Streptococcus agalactiae NOT DETECTED NOT DETECTED Final   Streptococcus pneumoniae NOT DETECTED NOT DETECTED Final   Streptococcus pyogenes NOT DETECTED NOT DETECTED Final   A.calcoaceticus-baumannii NOT DETECTED NOT DETECTED Final   Bacteroides fragilis NOT DETECTED NOT DETECTED Final   Enterobacterales NOT DETECTED NOT DETECTED Final   Enterobacter cloacae complex NOT DETECTED NOT DETECTED Final   Escherichia coli NOT DETECTED NOT DETECTED Final   Klebsiella aerogenes NOT DETECTED NOT DETECTED Final   Klebsiella oxytoca NOT DETECTED NOT DETECTED Final   Klebsiella pneumoniae NOT DETECTED NOT DETECTED Final   Proteus species NOT DETECTED NOT DETECTED Final   Salmonella species NOT DETECTED NOT DETECTED Final   Serratia marcescens NOT DETECTED NOT DETECTED Final   Haemophilus influenzae NOT DETECTED NOT DETECTED Final   Neisseria meningitidis NOT DETECTED NOT DETECTED Final   Pseudomonas aeruginosa NOT DETECTED NOT DETECTED Final   Stenotrophomonas maltophilia NOT DETECTED NOT DETECTED Final   Candida albicans NOT DETECTED NOT DETECTED Final   Candida auris NOT DETECTED NOT  DETECTED Final   Candida glabrata NOT DETECTED NOT DETECTED Final   Candida krusei NOT DETECTED NOT DETECTED Final   Candida parapsilosis NOT DETECTED NOT DETECTED Final   Candida tropicalis NOT DETECTED NOT DETECTED Final   Cryptococcus neoformans/gattii NOT DETECTED NOT DETECTED Final   Methicillin resistance mecA/C DETECTED (A) NOT DETECTED Final    Comment: CRITICAL RESULT CALLED TO, READ BACK BY AND VERIFIED WITH: L TRINH,PHARMD@[id number]  12/14/20 [place] Performed at [facility], [address] [place], [place] [address]   Blood Culture (routine x 2)     Status: None   Collection Time: 12/12/20 11:30 PM   Specimen: BLOOD RIGHT HAND   Result Value Ref Range Status   Specimen Description BLOOD RIGHT HAND  Final   Special Requests   Final    BOTTLES DRAWN AEROBIC AND ANAEROBIC Blood Culture results may not be optimal due to an inadequate volume of blood received in culture bottles   Culture   Final    NO GROWTH 5 DAYS Performed at [facility], [place] [address] [place], [place] [address]    Report Status 12/18/2020 FINAL  Final     Signed: Marlowe Aschoff Kaizen Ibsen  [facility] 12/21/2020, 11:46 AM

## 2020-12-24 ENCOUNTER — Telehealth: Payer: Self-pay

## 2020-12-24 ENCOUNTER — Other Ambulatory Visit: Payer: Self-pay | Admitting: *Deleted

## 2020-12-24 MED ORDER — AMLODIPINE BESYLATE 10 MG PO TABS: 10.0000 mg | ORAL_TABLET | Freq: Every day | ORAL | 0 refills | Status: AC

## 2020-12-24 MED ORDER — OXYCODONE-ACETAMINOPHEN 10-325 MG PO TABS: 1.0000 | ORAL_TABLET | Freq: Three times a day (TID) | ORAL | 0 refills | Status: DC | PRN

## 2020-12-24 NOTE — Telephone Encounter (Signed)
Rx send to pharmacy  

## 2020-12-24 NOTE — Telephone Encounter (Signed)
Patient is completely out and is wondering about an update for the prescription.

## 2020-12-24 NOTE — Telephone Encounter (Signed)
Transition Care Management Follow-up Telephone Call Date of discharge and from where: 12/21/2020 from [facility] How have you been since you were released from the hospital? Pt stated that she is feeling a lot better and did not have any questions or concerns at this time.  Any questions or concerns? No  Items Reviewed: Did the pt receive and understand the discharge instructions provided? Yes  Medications obtained and verified? Yes  Other? No  Any new allergies since your discharge? No  Dietary orders reviewed? No Do you have support at home? Yes   Functional Questionnaire: (I = Independent and D = Dependent) ADLs: I  Bathing/Dressing- I  Meal Prep- I  Eating- I  Maintaining continence- I  Transferring/Ambulation- I  Managing Meds- I   Follow up appointments reviewed:  PCP Hospital f/u appt confirmed? Yes  Scheduled to see Dimas Chyle, MD on 12/26/2020 @ 10:40am. [place] f/u appt confirmed? No   Are transportation arrangements needed? No  If their condition worsens, is the pt aware to call PCP or go to the Emergency Dept.? Yes Was the patient provided with contact information for the PCP's office or ED? Yes Was to pt encouraged to call back with questions or concerns? Yes

## 2020-12-25 DIAGNOSIS — Z89412 Acquired absence of left great toe: Secondary | ICD-10-CM | POA: Diagnosis not present

## 2020-12-25 DIAGNOSIS — Z89511 Acquired absence of right leg below knee: Secondary | ICD-10-CM | POA: Diagnosis not present

## 2020-12-25 DIAGNOSIS — L089 Local infection of the skin and subcutaneous tissue, unspecified: Secondary | ICD-10-CM | POA: Diagnosis not present

## 2020-12-25 DIAGNOSIS — E10628 Type 1 diabetes mellitus with other skin complications: Secondary | ICD-10-CM | POA: Diagnosis not present

## 2020-12-26 ENCOUNTER — Encounter: Payer: Self-pay | Admitting: Family Medicine

## 2020-12-26 ENCOUNTER — Ambulatory Visit (INDEPENDENT_AMBULATORY_CARE_PROVIDER_SITE_OTHER)
Admission: RE | Admit: 2020-12-26 | Discharge: 2020-12-26 | Disposition: A | Discharge status: Home / Self Care | Payer: Medicaid Other | Source: Ambulatory Visit | Attending: Family Medicine | Admitting: Family Medicine

## 2020-12-26 ENCOUNTER — Ambulatory Visit (INDEPENDENT_AMBULATORY_CARE_PROVIDER_SITE_OTHER): Payer: Medicaid Other | Admitting: Family Medicine

## 2020-12-26 ENCOUNTER — Other Ambulatory Visit: Payer: Self-pay

## 2020-12-26 VITALS — BP 165/97 | HR 93 | Temp 98.2°F | Ht 67.0 in | Wt 181.0 lb

## 2020-12-26 DIAGNOSIS — R059 Cough, unspecified: Secondary | ICD-10-CM

## 2020-12-26 DIAGNOSIS — R569 Unspecified convulsions: Secondary | ICD-10-CM

## 2020-12-26 DIAGNOSIS — E1043 Type 1 diabetes mellitus with diabetic autonomic (poly)neuropathy: Secondary | ICD-10-CM

## 2020-12-26 DIAGNOSIS — I152 Hypertension secondary to endocrine disorders: Secondary | ICD-10-CM | POA: Diagnosis not present

## 2020-12-26 DIAGNOSIS — E43 Unspecified severe protein-calorie malnutrition: Secondary | ICD-10-CM

## 2020-12-26 DIAGNOSIS — K3184 Gastroparesis: Secondary | ICD-10-CM

## 2020-12-26 DIAGNOSIS — R112 Nausea with vomiting, unspecified: Secondary | ICD-10-CM | POA: Diagnosis not present

## 2020-12-26 DIAGNOSIS — D649 Anemia, unspecified: Secondary | ICD-10-CM | POA: Diagnosis not present

## 2020-12-26 DIAGNOSIS — E1069 Type 1 diabetes mellitus with other specified complication: Secondary | ICD-10-CM

## 2020-12-26 DIAGNOSIS — E611 Iron deficiency: Secondary | ICD-10-CM | POA: Diagnosis not present

## 2020-12-26 DIAGNOSIS — E1159 Type 2 diabetes mellitus with other circulatory complications: Secondary | ICD-10-CM

## 2020-12-26 DIAGNOSIS — E1059 Type 1 diabetes mellitus with other circulatory complications: Secondary | ICD-10-CM | POA: Diagnosis not present

## 2020-12-26 DIAGNOSIS — R918 Other nonspecific abnormal finding of lung field: Secondary | ICD-10-CM | POA: Diagnosis not present

## 2020-12-26 DIAGNOSIS — J984 Other disorders of lung: Secondary | ICD-10-CM | POA: Diagnosis not present

## 2020-12-26 LAB — COMPREHENSIVE METABOLIC PANEL
ALT: 16 U/L (ref 0–35)
AST: 19 U/L (ref 0–37)
Albumin: 2.4 g/dL — ABNORMAL LOW (ref 3.5–5.2)
Alkaline Phosphatase: 100 U/L (ref 39–117)
BUN: 37 mg/dL — ABNORMAL HIGH (ref 6–23)
CO2: 21 mEq/L (ref 19–32)
Calcium: 7.8 mg/dL — ABNORMAL LOW (ref 8.4–10.5)
Chloride: 106 mEq/L (ref 96–112)
Creatinine, Ser: 1.9 mg/dL — ABNORMAL HIGH (ref 0.40–1.20)
GFR: 34.78 mL/min — ABNORMAL LOW (ref >60.00)
Glucose, Bld: 104 mg/dL — ABNORMAL HIGH (ref 70–99)
Potassium: 3.8 mEq/L (ref 3.5–5.1)
Sodium: 135 mEq/L (ref 135–145)
Total Bilirubin: 0.2 mg/dL (ref 0.2–1.2)
Total Protein: 6 g/dL (ref 6.0–8.3)

## 2020-12-26 LAB — CBC
HCT: 30 % — ABNORMAL LOW (ref 36.0–46.0)
Hemoglobin: 9.8 g/dL — ABNORMAL LOW (ref 12.0–15.0)
MCHC: 32.7 g/dL (ref 30.0–36.0)
MCV: 86.9 fl (ref 78.0–100.0)
Platelets: 162 10*3/uL (ref 150.0–400.0)
RBC: 3.45 Mil/uL — ABNORMAL LOW (ref 3.87–5.11)
RDW: 14 % (ref 11.5–15.5)
WBC: 6 10*3/uL (ref 4.0–10.5)

## 2020-12-26 IMAGING — DX DG CHEST 2V
2 series · 2 of 2 positions shown · non-contrast
Comparison: Chest x-ray [DATE].

CLINICAL DATA: 30-year-old female with history of abnormal chest
x-ray. Follow-up study. History of COVID infection multiple times.

EXAM:
CHEST - 2 VIEW

[chest pa]
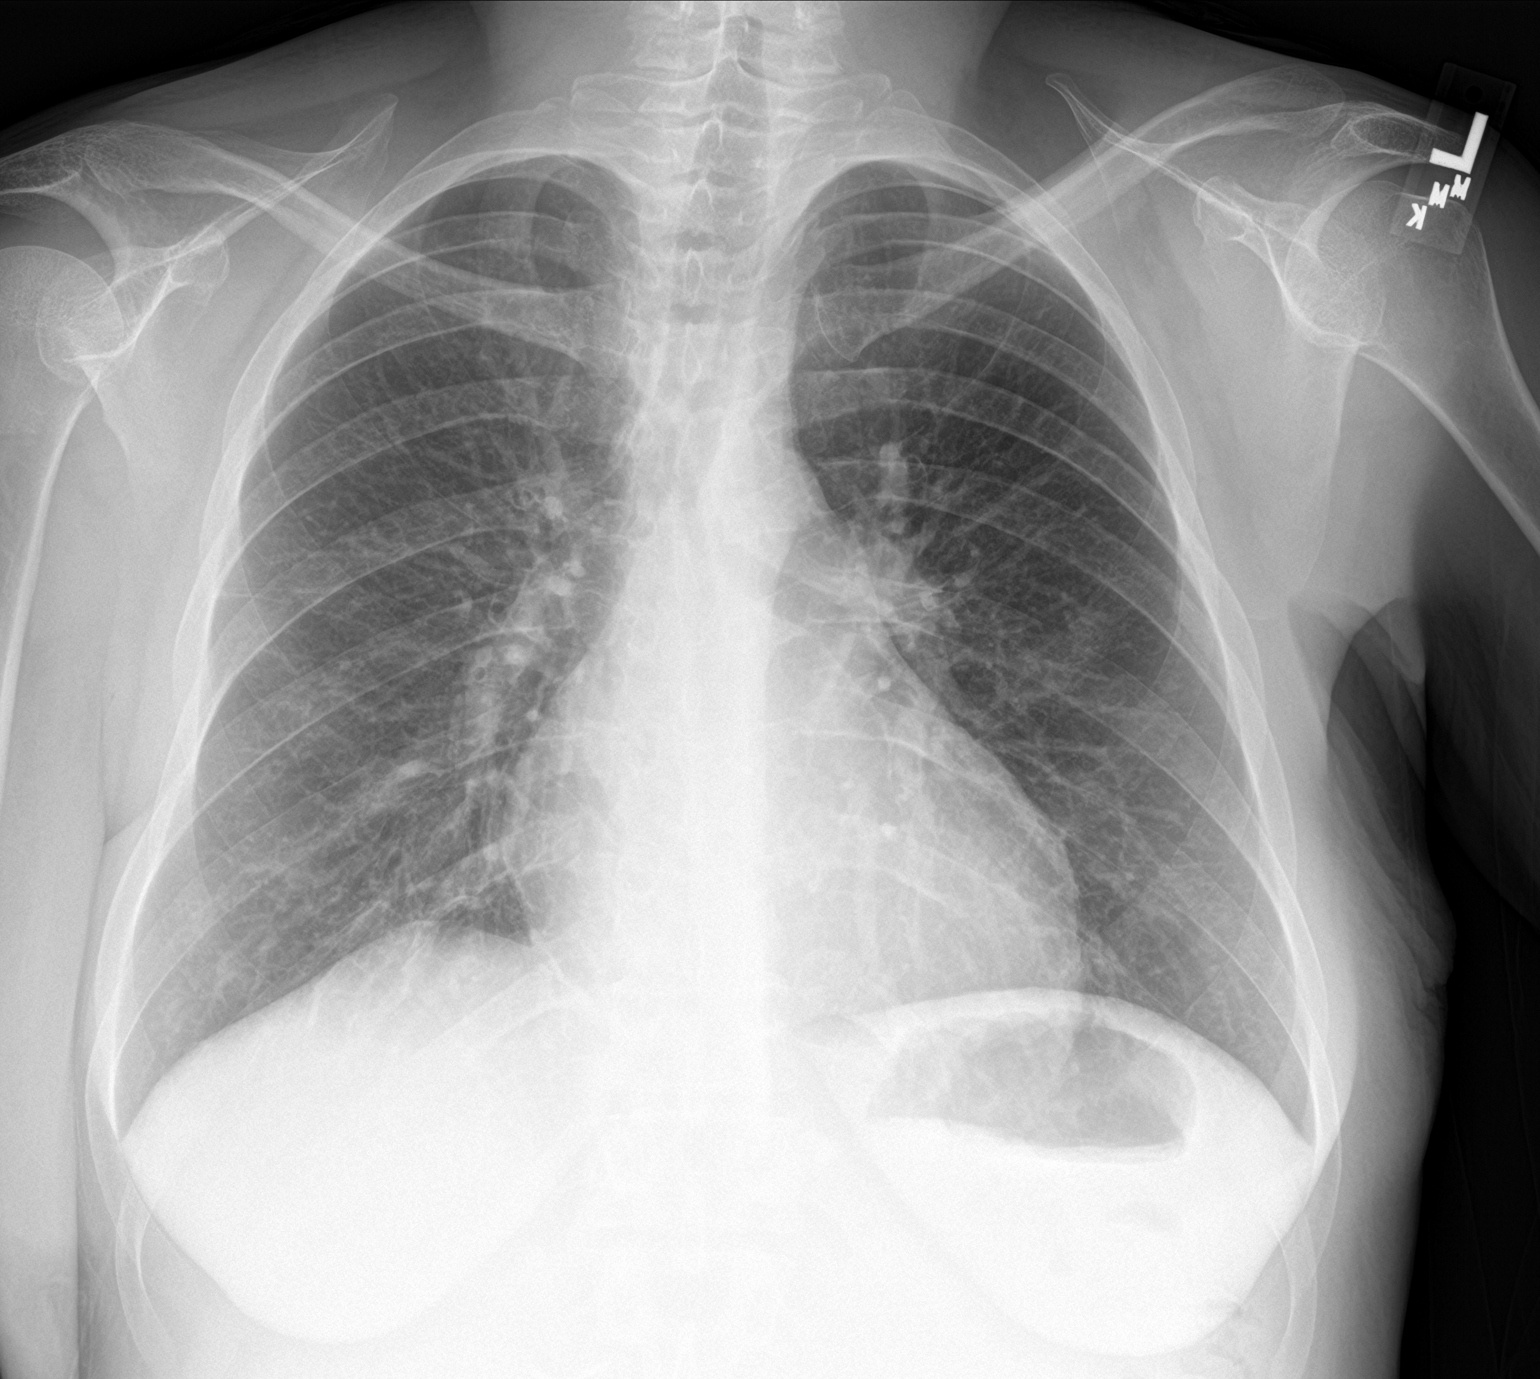

[chest lat]
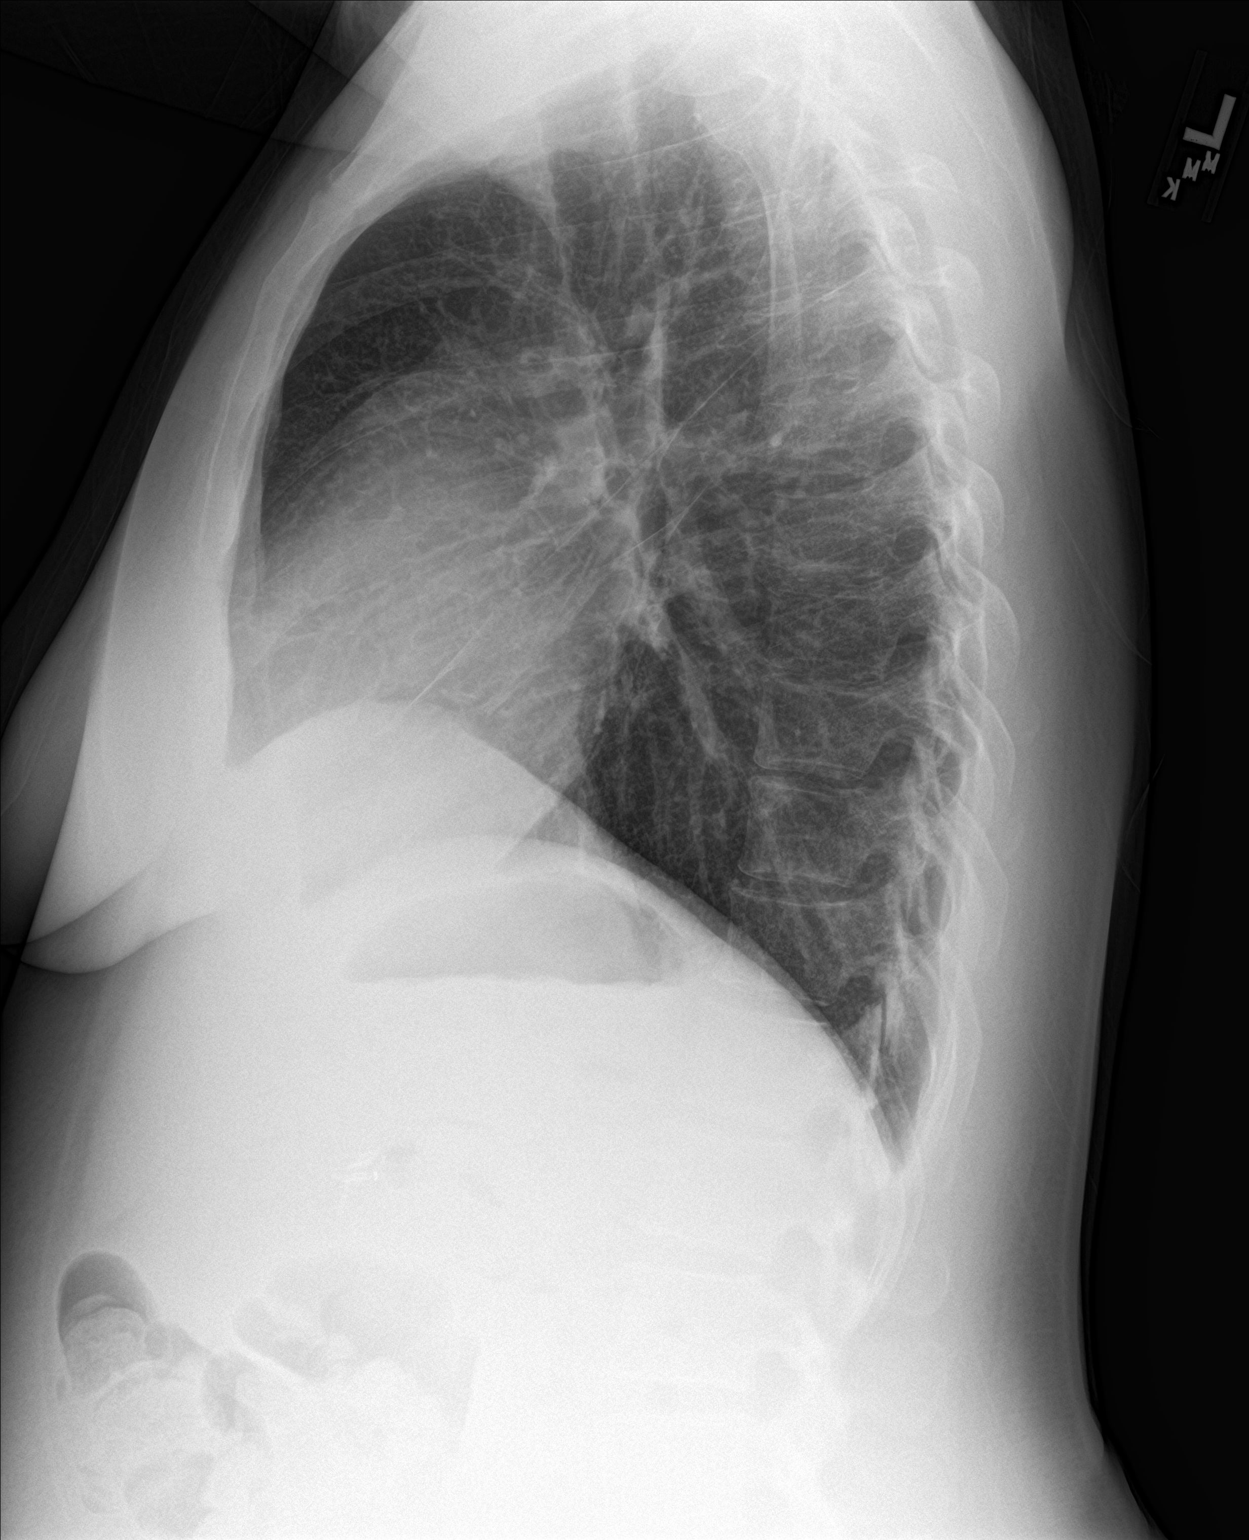

[2 of 2 positions shown; findings below may reference images not displayed]

FINDINGS: Lung volumes are normal. Mild linear scarring in the right mid lung,
unchanged. No consolidative airspace disease. No pleural effusions.
No pneumothorax. No pulmonary nodule or mass noted. Pulmonary
vasculature and the cardiomediastinal silhouette are within normal
limits.
IMPRESSION: No radiographic evidence of acute cardiopulmonary disease.

## 2020-12-26 MED ORDER — CARVEDILOL 25 MG PO TABS: 25.0000 mg | ORAL_TABLET | Freq: Two times a day (BID) | ORAL | 3 refills | Status: AC

## 2020-12-26 MED ORDER — PROMETHAZINE HCL 12.5 MG PO TABS: 12.5000 mg | ORAL_TABLET | Freq: Three times a day (TID) | ORAL | 0 refills | Status: AC | PRN

## 2020-12-26 NOTE — Assessment & Plan Note (Signed)
Discussed protein supplementation.

## 2020-12-26 NOTE — Assessment & Plan Note (Signed)
At this point there seems to be patient's main problem and the main reason for her frequent hospitalizations, protein calorie malnutrition, and poor wound healing.  She will be following up with GI soon.  She will continue taking Reglan 3 times daily before meals.  She has Zofran but we will also send in a prescription for Phenergan as this will sometimes work better than the Zofran for her. Recommended close follow up with GI and may need referral to tertiary care center.

## 2020-12-26 NOTE — Patient Instructions (Addendum)
It was very nice to see you today!  Please increase your Coreg to 25 mg twice daily.  I will also send in Phenergan.  We will check blood work and an x-ray today.  Please make sure that you are getting plenty of protein in your diet.  I will see you back in 3 months.  Come back to see me sooner if needed.   Take care, Dr Jerline Pain  PLEASE NOTE:  If you had any lab tests please let us know if you have not heard back within a few days. You may see your results on mychart before we have a chance to review them but we will give you a call once they are reviewed by [place]. If we ordered any referrals today, please let us know if you have not heard from their office within the next week.   Please try these tips to maintain a healthy lifestyle:  Eat at least 3 REAL meals and 1-2 snacks per day.  Aim for no more than 5 hours between eating.  If you eat breakfast, please do so within one hour of getting up.   Each meal should contain half fruits/vegetables, one quarter protein, and one quarter carbs (no bigger than a computer mouse)  Cut down on sweet beverages. This includes juice, soda, and sweet tea.   Drink at least 1 glass of water with each meal and aim for at least 8 glasses per day  Exercise at least 150 minutes every week.

## 2020-12-26 NOTE — Assessment & Plan Note (Signed)
Has received blood transfusion in the hospital.  She will continue iron supplementation.

## 2020-12-26 NOTE — Assessment & Plan Note (Signed)
Her blood sugars have been well controlled over the last several months.  Latest A1c 7.2.  She will continue her current regimen of Lantus 8 units daily and sliding scale Humalog 0 to 5 units 3 times daily with meals.

## 2020-12-26 NOTE — Progress Notes (Signed)
Chief Complaint:  [name] is a 30 y.o. female who presents today for a TCM visit.  Assessment/Plan:  Chronic Problems Addressed Today: Hypertension associated with diabetes ([place]) Still elevated today.  Given her recent admission for PRES be reasonable to increase therapy today.  We will increase Coreg to 25 mg twice daily.  Continue amlodipine 10 mg daily and hydralazine 50 mg 4 times daily.She will continue home monitoring.  Follow-up in 3 months.  She will let me know if persistently elevated.  Seizure ([place]) No recurrent seizures.  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  She is on Keppra and tolerating well.  Will place referral for her to follow-up with neurology.  Type 1 diabetes mellitus with other specified complication (HCC) Her blood sugars have been well controlled over the last several months.  Latest A1c 7.2.  She will continue her current regimen of Lantus 8 units daily and sliding scale Humalog 0 to 5 units 3 times daily with meals.  Iron deficiency Has received blood transfusion in the hospital.  She will continue iron supplementation.  Diabetic gastroparesis associated with type 1 diabetes mellitus ([place]) At this point there seems to be patient's main problem and the main reason for her frequent hospitalizations, protein calorie malnutrition, and poor wound healing.  She will be following up with GI soon.  She will continue taking Reglan 3 times daily before meals.  She has Zofran but we will also send in a prescription for Phenergan as this will sometimes work better than the Zofran for her. Recommended close follow up with GI and may need referral to tertiary care center.  Chronic anemia Check CBC.  Severe protein-calorie malnutrition ([place]) Discussed protein supplementation.    Subjective:  HPI:  Summary of Hospital admission: Patient with extensive PMH presented to the ED with multiple complaints including back pain, toe pain, nausea and vomiting.  She had attempted to take  her promethazine at home along with a Percocet for pain control without much improvement in symptoms.  Pain was described as a sharp sensation to the lumbar spine without any radiation.   She has extensive medical history including a recent admission on 12/12/2020 for altered mental status. This visit 12/20/2020, patient was described as nontoxic in appearance, described as writhing in pain and stating at the time "it hurts so bad ".  Pain with palpation was noted along the lumbar spine.  No prior history of IV drug use per patient.  No bowel or bladder incontinence was noted.  She did not have any urinary symptoms but does have a prior history of CKD 3 and was able to produce urine.  It was noted at the time that she had had a VQ scan during a previous admission to rule out pulmonary embolism post-COVID infection earlier this month. This was negative.  This visit revealed CBC with no leukocytosis and  slightly decreased hemoglobin at 11.  CMP with a creatinine level that improved from her previous hospital admission at 1.2, albumin at 0.8 with some improvement with her previous.  UA with large protein, large hematuria was noted, and trace of leukocytes was found to be negative. UDS was found to be positive for opioids along with THC. She was given Dilaudid in the ED for pain control as she reported severe pain.    She was also given Phenergan, blood pressure medication such as Coreg, hydralazine which she did not take this morning prior to arrival.  She was given a second round  of Compazine along with more Dilaudid without any improvement in her symptoms. She was monitored in the ED for approximately 8 hours without controlling her symptoms.  She was admitted for observation.    Continued to be persistently tachycardic with a heart rate in the 112's, negative VQ scan during her 12/12/2020 visit, no signs of hypoxia on this  12/20/2020 visit.  CT abdomen scan obtained without any acute findings.  She does  have prior history of gastroparesis, and this was suspected a recurrence of this as the reason for her admission.  A CT was obtained which showed no significant changes from the 12/03/2020 CT scan. She remained hemodynamically stable with tachycardia.   12/21/2020 She was found to be feeling much better with vitals improving, was able to tolerate diabetic diet and was discharged.   Interim history:    There are a number of major events which have occurred, in the chain of hospital visits.   She states that first she had a fever which she believes was covid. Some point after that she had a seizure which she states was worse than any other she had had previously, requiring intubation. Since the second time she went to the hospital in the chain of visits she has had unusually high blood pressure which has been of particular concern to the hospital.   This second visit occurred at night after a bad day at work. Her husband noted that she looked like she was asleep and tried to interact with her, but she was completely unresponsive and took her to the hospital.  She denies experiencing bleeding, but requires a blood transfusion every time she goes for a visit. Also, she states that she has a FMHx of blood disorders; her grandfather and uncle both died of leukemia for example. Her father also has type II diabetes.  She also notes that she experiences heartburn severely, to the point where she stops eating due to it.  She has been prescribed seizure medication Keppra, but is unsure if any other medication was added. She has not yet followed up with nurology.   When she feels an episode coming on she uses the Zofran and eats a liquid diet, after which if not resolved she takes the Promethazine suppository.  She still takes Reglan, but she has to take it early enough otherwise it will not work due to an inability to keep any medication down.  Her blood sugar she notes has been doing better, in part due to  her keeping juice and other items around for availability and when her blood sugar does decrease it does not drop as much as it has in the past.  The continuous vomiting and nausea has been worsening the condition of her kidneys, and was diagnosed with a stage of kidney disease. She has been taking Lasix in relation to this.  She was given a prescription for protein gels, but the pharmacy will not fulfill the prescription because it apparently can be acquired over the counter. This includes amino acid and vitamin B supplements which are also being denied.  She has concerns about the condition of her lungs, as she has had pneumonia in the past, before her covid infection. Currently she has an inhaler prescribed due to difficulty breathing. As it stands now she can breathe fairly fine, but it is noticeable in the morning that she does not breathe well in her sleep as she wakes up with a dry mouth and congestion.  ROS: Per HPI, otherwise  a complete review of systems was negative.   PMH:  The following were reviewed and entered/updated in epic: Past Medical History:  Diagnosis Date   Allergy    Anemia    Anxiety    Blood transfusion without reported diagnosis    Dec 2018   Cataract    right eye   COVID-19 03/2020   Depression    Diabetes type 1, uncontrolled ([place]) 11/14/2011   Since age 63   Fibromyalgia    Gastroparesis    GERD (gastroesophageal reflux disease)    Hypertension    Infection    UTI April 2016   Patient Active Problem List   Diagnosis Date Noted   Non-pressure chronic ulcer of other part of left foot limited to breakdown of skin ([place])    PVD (peripheral vascular disease) ([place])    Hx of BKA, right ([place])    Venous stasis of both lower extremities    Chronic diastolic CHF (congestive heart failure) ([place]) 12/13/2020   Seizure ([place])    PRES (posterior reversible encephalopathy syndrome)    Type 1 diabetes mellitus with other specified complication ([place]) 37/85/8850    Insomnia 11/15/2020   Iron deficiency 04/11/2020   Osteomyelitis ([place]) 04/10/2020   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 ([place]) 02/03/2020   Cannabinoid hyperemesis syndrome 04/30/2019   Contraception management 08/06/2018   Diabetic gastroparesis associated with type 1 diabetes mellitus ([place]) 06/04/2018   Anxiety 05/31/2018   Peripheral edema 05/31/2018   Chronic anemia 07/10/2017   GERD (gastroesophageal reflux disease) 06/23/2017   MDD (major depressive disorder), recurrent episode, 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/28/2017   Severe protein-calorie malnutrition ([place]) 04/28/2013   Hypertension associated with diabetes ([place]) 11/14/2011   Past Surgical History:  Procedure Laterality Date   AMPUTATION Right 04/13/2020   Procedure: 1ST RAY AMPUTATION RIGHT FOOT;  Surgeon: Newt Minion, MD;  Location: [facility];  Service: Orthopedics;  Laterality: Right;   AMPUTATION Right 06/06/2020   Procedure: RIGHT BELOW KNEE AMPUTATION;  Surgeon: Newt Minion, MD;  Location: [facility];  Service: Orthopedics;  Laterality: Right;   AMPUTATION Left    great toe   ANKLE SURGERY     APPLICATION OF WOUND VAC Right 06/06/2020   Procedure: APPLICATION OF WOUND VAC;  Surgeon: Newt Minion, MD;  Location: [facility];  Service: Orthopedics;  Laterality: Right;   CHOLECYSTECTOMY  11/15/2011   Procedure: LAPAROSCOPIC CHOLECYSTECTOMY WITH INTRAOPERATIVE CHOLANGIOGRAM;  Surgeon: [name], MD;  Location: [facility];  Service: General;  Laterality: N/A;   COLONOSCOPY     COLONOSCOPY WITH PROPOFOL N/A 06/27/2017   Procedure: COLONOSCOPY WITH PROPOFOL;  Surgeon: Milus Banister, MD;  [facility]: [facility];  Service: Endoscopy;  Laterality: N/A;   ESOPHAGOGASTRODUODENOSCOPY  12/03/2011   Procedure: ESOPHAGOGASTRODUODENOSCOPY (EGD);  Surgeon: Beryle Beams, MD;  Location: [facility];  Service: Endoscopy;  Laterality: N/A;   FLEXIBLE SIGMOIDOSCOPY N/A 03/10/2017   Procedure: FLEXIBLE SIGMOIDOSCOPY;  Surgeon: Carol Ada, MD;   [facility];  Service: Endoscopy;  Laterality: N/A;   INCISION AND DRAINAGE PERIRECTAL ABSCESS N/A 03/01/2017   Procedure: IRRIGATION AND DEBRIDEMENT PERIRECTAL ABSCESS;  Surgeon: Alphonsa Overall, MD;  Location: [facility];  Service: General;  Laterality: N/A;   IRRIGATION AND DEBRIDEMENT BUTTOCKS N/A 03/23/2017   Procedure: IRRIGATION AND DEBRIDEMENT BUTTOCKS, SETON PLACEMENT;  Surgeon: [name], MD;  Location: [facility];  Service: General;  Laterality: N/A;   LAPAROSCOPY  11/23/2011   Procedure: LAPAROSCOPY DIAGNOSTIC;  Surgeon: Edward Jolly,  MD;  Location: [facility];  Service: General;  Laterality: N/A;   SIGMOIDOSCOPY     UPPER GASTROINTESTINAL ENDOSCOPY     WISDOM TOOTH EXTRACTION      Family History  Problem Relation Age of Onset   Diabetes Mother    Hypertension Father    Colon cancer Paternal Grandmother        pt thinks PGM was dx in her 90's   Diabetes Paternal Grandmother    Diabetes Maternal Grandmother    Diabetes Maternal Grandfather    Diabetes Paternal Grandfather    Diabetes Other    Breast cancer Paternal Aunt    Esophageal cancer Neg Hx    Liver cancer Neg Hx    Pancreatic cancer Neg Hx    Stomach cancer Neg Hx    Rectal cancer Neg Hx     Medications- Reconciled discharge and current medications in Epic.  Current Outpatient Medications  Medication Sig Dispense Refill   acetaminophen (TYLENOL) 500 MG tablet Take 1,000 mg by mouth every 6 (six) hours as needed for moderate pain or headache.     Albuterol Sulfate (PROAIR RESPICLICK) 546 (90 Base) MCG/ACT AEPB Inhale 2 puffs into the lungs daily as needed. (Patient taking differently: Inhale 2 puffs into the lungs daily as needed (wheezing).) 1 each 2   amLODipine (NORVASC) 10 MG tablet Take 1 tablet (10 mg total) by mouth daily. 30 tablet 0   blood glucose meter kit and supplies by Other route. Check blood sugar 3-4 times     busPIRone (BUSPAR) 5 MG tablet Take 1 tablet (5 mg total) by mouth 3 (three)  times daily. 90 tablet 5   Carboxymethylcell-Hypromellose (GENTEAL) 0.25-0.3 % GEL Place 1 drop into both eyes daily as needed (dryeyes).     Continuous Blood Gluc Sensor (DEXCOM G6 SENSOR) MISC USE AS DIRECTED EVERY 10 DAYS 3 each 1   Continuous Blood Gluc Transmit (DEXCOM G6 TRANSMITTER) MISC USE AS DIRECTED TO  MONITOR  GLUCOSE  LEVELS 1 each 0   cyclobenzaprine (FLEXERIL) 10 MG tablet Take 1 tablet (10 mg total) by mouth 3 (three) times daily as needed for muscle spasms. 30 tablet 5   dicyclomine (BENTYL) 20 MG tablet Take 1 tablet (20 mg total) by mouth every 8 (eight) hours as needed for spasms. 15 tablet 0   EPINEPHrine 0.3 mg/0.3 mL IJ SOAJ injection Inject 0.3 mg into the muscle once as needed (anaphylaxis/allergic reaction). (Patient taking differently: Inject 0.3 mg into the muscle once as needed for anaphylaxis.) 2 each 0   ferrous sulfate 325 (65 FE) MG tablet Take 1 tablet (325 mg total) by mouth 2 (two) times daily with a meal. 60 tablet 0   folic acid (FOLVITE) 1 MG tablet Take 1 tablet (1 mg total) by mouth daily. 30 tablet 0   gabapentin (NEURONTIN) 100 MG capsule Take 200 mg by mouth 3 (three) times daily.     hydrALAZINE (APRESOLINE) 50 MG tablet Take 50 mg by mouth 4 (four) times daily.     insulin glargine (LANTUS) 100 UNIT/ML injection Inject 0.05 mLs (5 Units total) into the skin daily. 10 mL 11   insulin lispro (HUMALOG) 100 UNIT/ML injection Inject 0-5 Units into the skin 3 (three) times daily before meals. Sliding scale     Insulin Pen Needle 32G X 4 MM MISC USE AS DIRECTED WITH INSULIN PENS 100 each 0   Ipratropium-Albuterol (COMBIVENT) 20-100 MCG/ACT AERS respimat Inhale 1 puff into the lungs every 6 (six) hours  as needed for wheezing or shortness of breath. 4 g 0   levETIRAcetam (KEPPRA) 500 MG tablet Take 1 tablet (500 mg total) by mouth 2 (two) times daily. 60 tablet 0   loperamide (IMODIUM) 2 MG capsule Take 6 mg by mouth daily as needed for diarrhea or loose stools.       LORazepam (ATIVAN) 0.5 MG tablet Take 1 tablet (0.5 mg total) by mouth 2 (two) times daily. 60 tablet 5   metoCLOPramide (REGLAN) 5 MG tablet Take 1 tablet (5 mg total) by mouth 3 (three) times daily before meals. Please make and keep an appointment for further refills. 90 tablet 2   Nutritional Supplements (,FEEDING SUPPLEMENT, PROSOURCE PLUS) liquid Take 30 mLs by mouth 2 (two) times daily between meals. 887 mL 0   ondansetron (ZOFRAN ODT) 4 MG disintegrating tablet Take 1 tablet (4 mg total) by mouth every 8 (eight) hours as needed for nausea or vomiting. 20 tablet 0   oxyCODONE-acetaminophen (PERCOCET) 10-325 MG tablet Take 1 tablet by mouth every 8 (eight) hours as needed for pain. 30 tablet 0   pantoprazole (PROTONIX) 40 MG tablet Take 1 tablet (40 mg total) by mouth daily. 30 tablet 0   promethazine (PHENERGAN) 12.5 MG tablet Take 1 tablet (12.5 mg total) by mouth every 8 (eight) hours as needed for nausea or vomiting. 60 tablet 0   traZODone (DESYREL) 50 MG tablet Take 0.5-1 tablets (25-50 mg total) by mouth at bedtime as needed for sleep. 30 tablet 3   vitamin B-12 1000 MCG tablet Take 1 tablet (1,000 mcg total) by mouth daily. 30 tablet 0   zinc sulfate 220 (50 Zn) MG capsule Take 1 capsule (220 mg total) by mouth daily. 30 capsule 0   carvedilol (COREG) 25 MG tablet Take 1 tablet (25 mg total) by mouth 2 (two) times daily with a meal. 180 tablet 3   No current facility-administered medications for this visit.    Allergies-reviewed and updated Allergies  Allergen Reactions   Other Anaphylaxis    Reaction to [place] nuts    Lactose Intolerance (Gi) Diarrhea    Social History   Socioeconomic History   Marital status: Married    Spouse name: sergio   Number of children: 0   Years of education: college   Highest education level: Not on file  Occupational History   Occupation: Publishing rights manager call center    Employer: [employer]  Tobacco Use   Smoking status: Never    Smokeless tobacco: Never  Vaping Use   Vaping Use: Never used  Substance and Sexual Activity   Alcohol use: No    Alcohol/week: 0.0 standard drinks   Drug use: Not Currently    Types: Marijuana   Sexual activity: Not Currently    Partners: Male    Birth control/protection: None  Other Topics Concern   Not on file  Social History Narrative   Not on file   Social Determinants of Health   Financial Resource Strain: Not on file  Food Insecurity: Food Insecurity Present   Worried About [place] in the Last Year: Sometimes true   Ran Out of Food in the Last Year: Sometimes true  Transportation Needs: No Transportation Needs   Lack of Transportation (Medical): No   Lack of Transportation (Non-Medical): No  Physical Activity: Not on file  Stress: Not on file  Social Connections: Not on file        Objective:  Physical Exam: BP (!) 165/97  Pulse 93   Temp 98.2 F (36.8 C) (Temporal)   Ht 5' 7"  (1.702 m)   Wt 181 lb (82.1 kg)   SpO2 99%   BMI 28.35 kg/m   Gen: NAD, resting comfortably CV: RRR with no murmurs appreciated Pulm: NWOB, CTAB with no crackles, wheezes, or rhonchi GI: Normal bowel sounds present. Soft, Nontender, Nondistended. MSK: No edema, cyanosis, or clubbing noted Skin: Warm, dry Neuro: Grossly normal, moves all extremities Psych: Normal affect and thought content      I,Jordan Kelly,acting as a scribe for Dimas Chyle, MD.,have documented all relevant documentation on the behalf of Dimas Chyle, MD,as directed by  Dimas Chyle, MD while in the presence of Dimas Chyle, MD.  I, Dimas Chyle, MD, have reviewed all documentation for this visit. The documentation on 12/26/20 for the exam, diagnosis, procedures, and orders are all accurate and complete.  Time Spent: 50 minutes of total time was spent on the date of the encounter performing the following actions: chart review prior to seeing the patient including her recent hospitalizations and  ED visits, obtaining history, performing a medically necessary exam, counseling on the treatment plan, placing orders, and documenting in our EHR.    Algis Greenhouse. Jerline Pain, MD 12/26/2020 11:25 AM

## 2020-12-26 NOTE — Assessment & Plan Note (Signed)
No recurrent seizures.  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  She is on Keppra and tolerating well.  Will place referral for her to follow-up with neurology.

## 2020-12-26 NOTE — Assessment & Plan Note (Signed)
Check CBC 

## 2020-12-26 NOTE — Assessment & Plan Note (Signed)
Still elevated today.  Given her recent admission for PRES be reasonable to increase therapy today.  We will increase Coreg to 25 mg twice daily.  Continue amlodipine 10 mg daily and hydralazine 50 mg 4 times daily.She will continue home monitoring.  Follow-up in 3 months.  She will let me know if persistently elevated.

## 2020-12-27 ENCOUNTER — Ambulatory Visit: Payer: Medicaid Other | Admitting: Physical Therapy

## 2020-12-28 NOTE — Progress Notes (Signed)
Please inform patient of the following:  Her blood work and xray are all stable. We can recheck again in 3 months.

## 2021-01-01 ENCOUNTER — Other Ambulatory Visit: Payer: Self-pay | Admitting: Family Medicine

## 2021-01-01 MED ORDER — AZITHROMYCIN 250 MG PO TABS: ORAL_TABLET | ORAL | 0 refills | Status: AC

## 2021-01-02 ENCOUNTER — Encounter (HOSPITAL_COMMUNITY): Payer: Self-pay | Admitting: Emergency Medicine

## 2021-01-02 ENCOUNTER — Emergency Department (HOSPITAL_COMMUNITY)
Admission: EM | Admit: 2021-01-02 | Discharge: 2021-01-03 | Disposition: A | Discharge status: Home / Self Care | Payer: Medicaid Other | Attending: Emergency Medicine | Admitting: Emergency Medicine

## 2021-01-02 ENCOUNTER — Telehealth: Payer: Self-pay | Admitting: Family Medicine

## 2021-01-02 ENCOUNTER — Other Ambulatory Visit: Payer: Self-pay

## 2021-01-02 ENCOUNTER — Emergency Department (HOSPITAL_COMMUNITY): Payer: Medicaid Other

## 2021-01-02 DIAGNOSIS — R1013 Epigastric pain: Secondary | ICD-10-CM | POA: Insufficient documentation

## 2021-01-02 DIAGNOSIS — K219 Gastro-esophageal reflux disease without esophagitis: Secondary | ICD-10-CM | POA: Diagnosis not present

## 2021-01-02 DIAGNOSIS — Z8616 Personal history of COVID-19: Secondary | ICD-10-CM | POA: Insufficient documentation

## 2021-01-02 DIAGNOSIS — I10 Essential (primary) hypertension: Secondary | ICD-10-CM | POA: Diagnosis not present

## 2021-01-02 DIAGNOSIS — R112 Nausea with vomiting, unspecified: Secondary | ICD-10-CM | POA: Insufficient documentation

## 2021-01-02 DIAGNOSIS — R0789 Other chest pain: Secondary | ICD-10-CM | POA: Diagnosis not present

## 2021-01-02 DIAGNOSIS — E109 Type 1 diabetes mellitus without complications: Secondary | ICD-10-CM | POA: Insufficient documentation

## 2021-01-02 DIAGNOSIS — M7989 Other specified soft tissue disorders: Secondary | ICD-10-CM | POA: Diagnosis not present

## 2021-01-02 DIAGNOSIS — R0602 Shortness of breath: Secondary | ICD-10-CM | POA: Diagnosis not present

## 2021-01-02 LAB — CBG MONITORING, ED: Glucose-Capillary: 222 mg/dL — ABNORMAL HIGH (ref 70–99)

## 2021-01-02 LAB — CBC
HCT: 29 % — ABNORMAL LOW (ref 36.0–46.0)
Hemoglobin: 9.4 g/dL — ABNORMAL LOW (ref 12.0–15.0)
MCH: 28.2 pg (ref 26.0–34.0)
MCHC: 32.4 g/dL (ref 30.0–36.0)
MCV: 87.1 fL (ref 80.0–100.0)
Platelets: 264 10*3/uL (ref 150–400)
RBC: 3.33 MIL/uL — ABNORMAL LOW (ref 3.87–5.11)
RDW: 13.3 % (ref 11.5–15.5)
WBC: 9.4 10*3/uL (ref 4.0–10.5)
nRBC: 0 % (ref 0.0–0.2)

## 2021-01-02 IMAGING — DX DG FOOT COMPLETE 3+V*L*
3 series · 3 of 3 positions shown · non-contrast
Comparison: [DATE]

CLINICAL DATA: Foot pain

EXAM:
LEFT FOOT - COMPLETE 3+ VIEW

[foot obl]
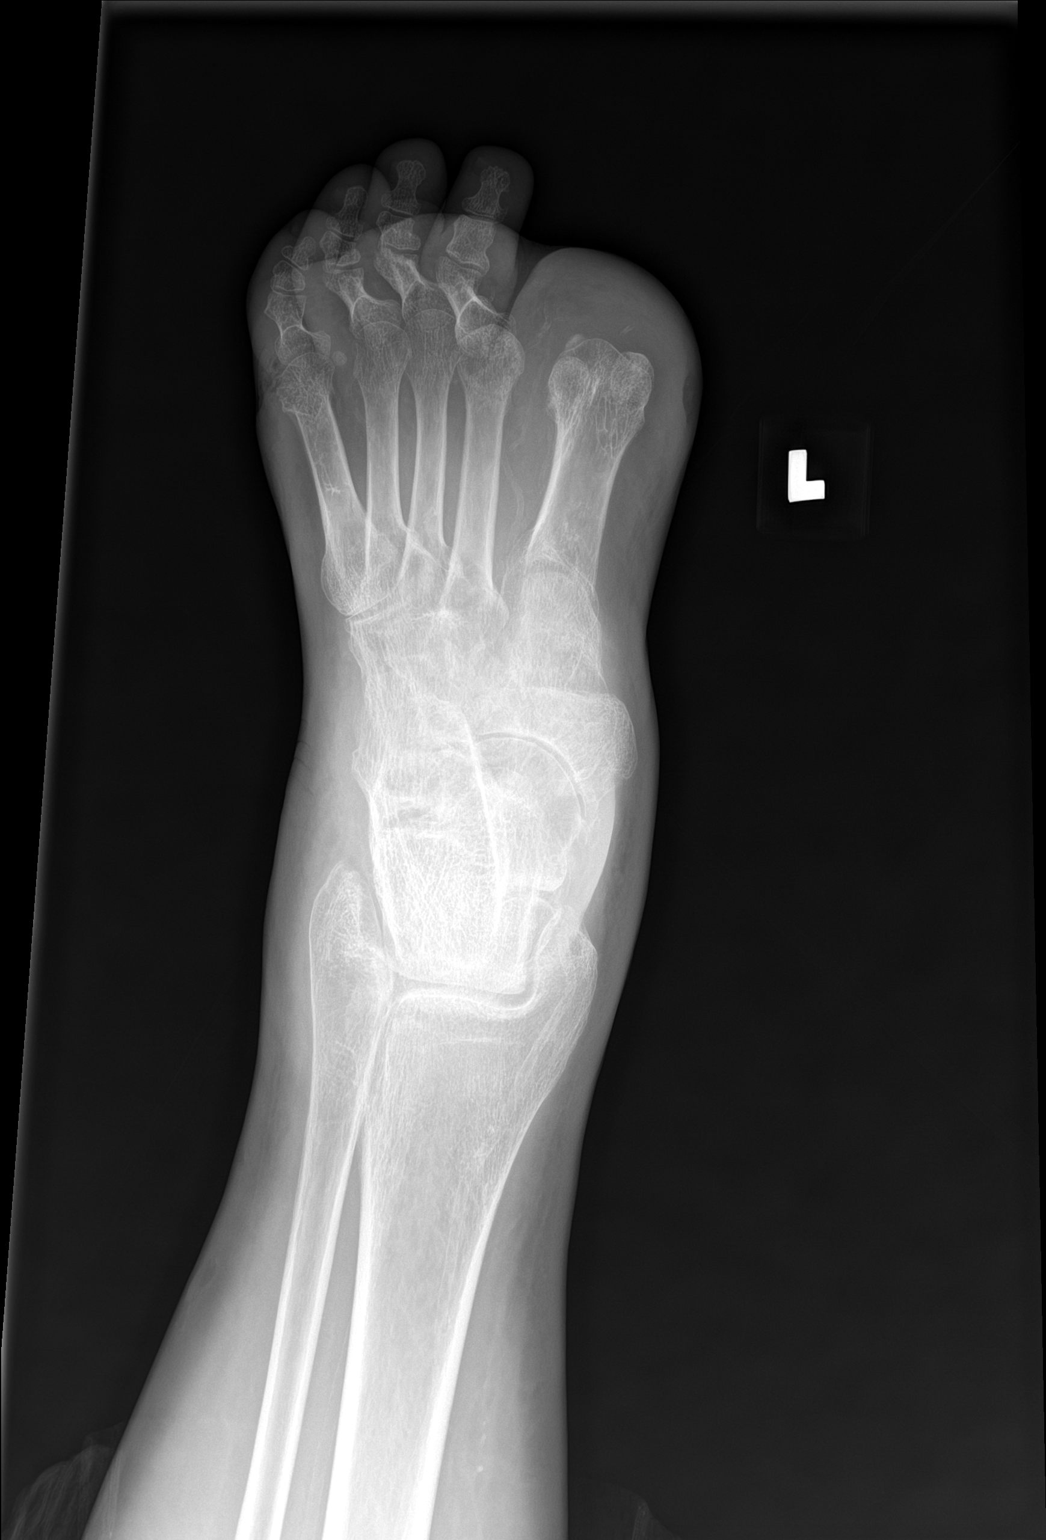

[foot ap]
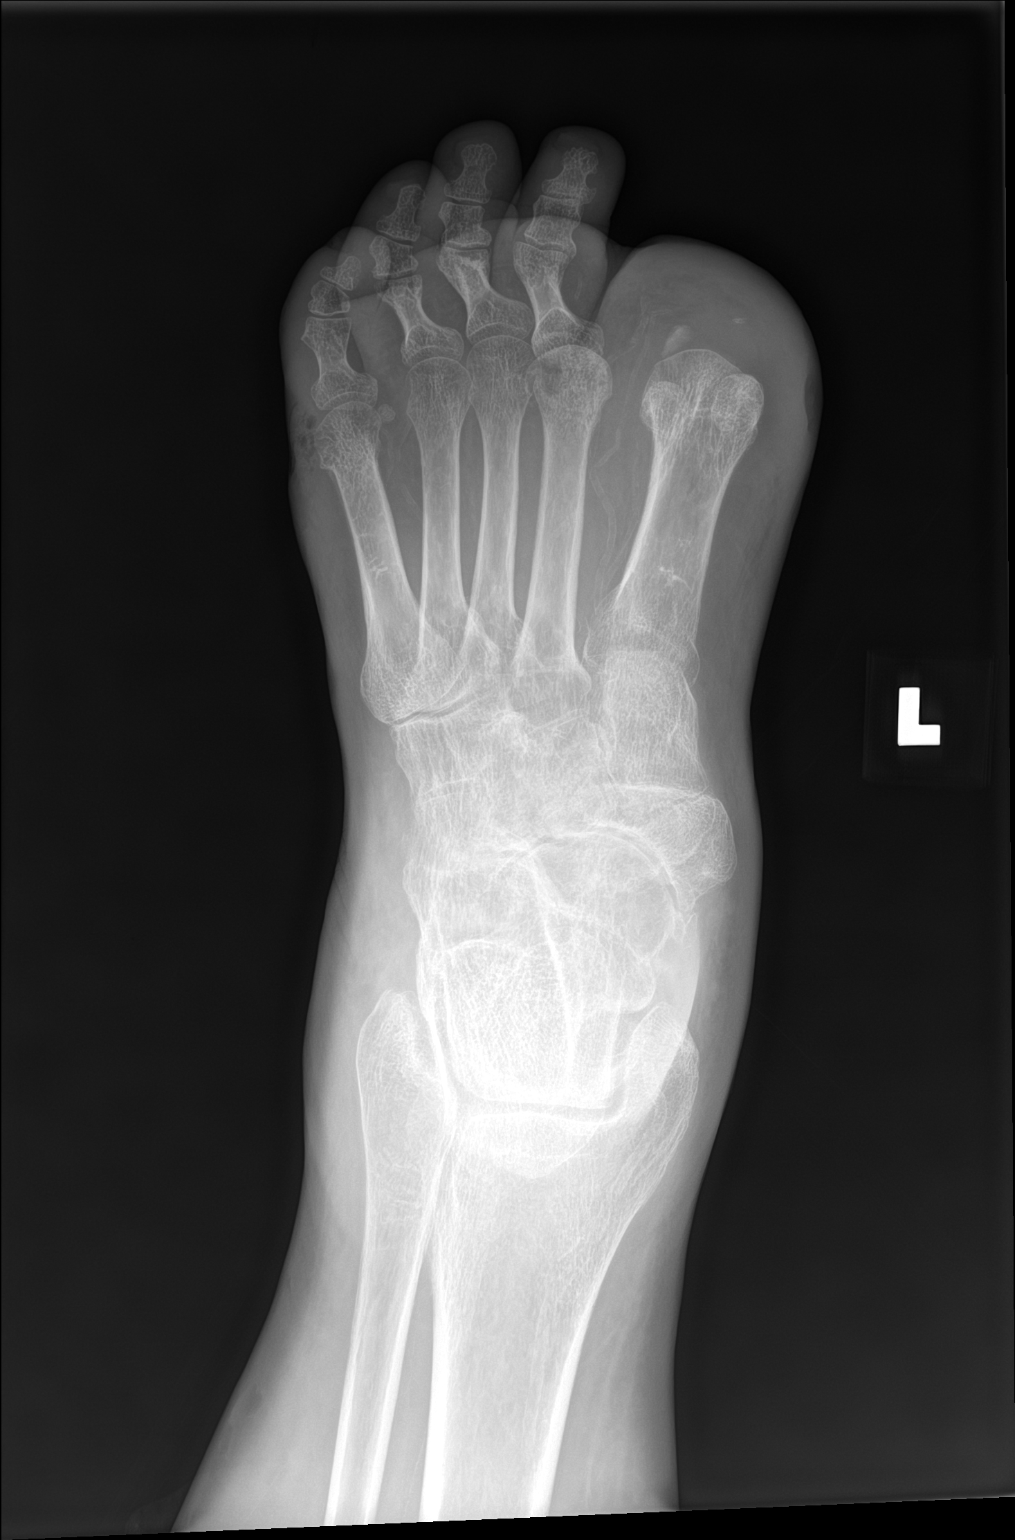

[foot lat]
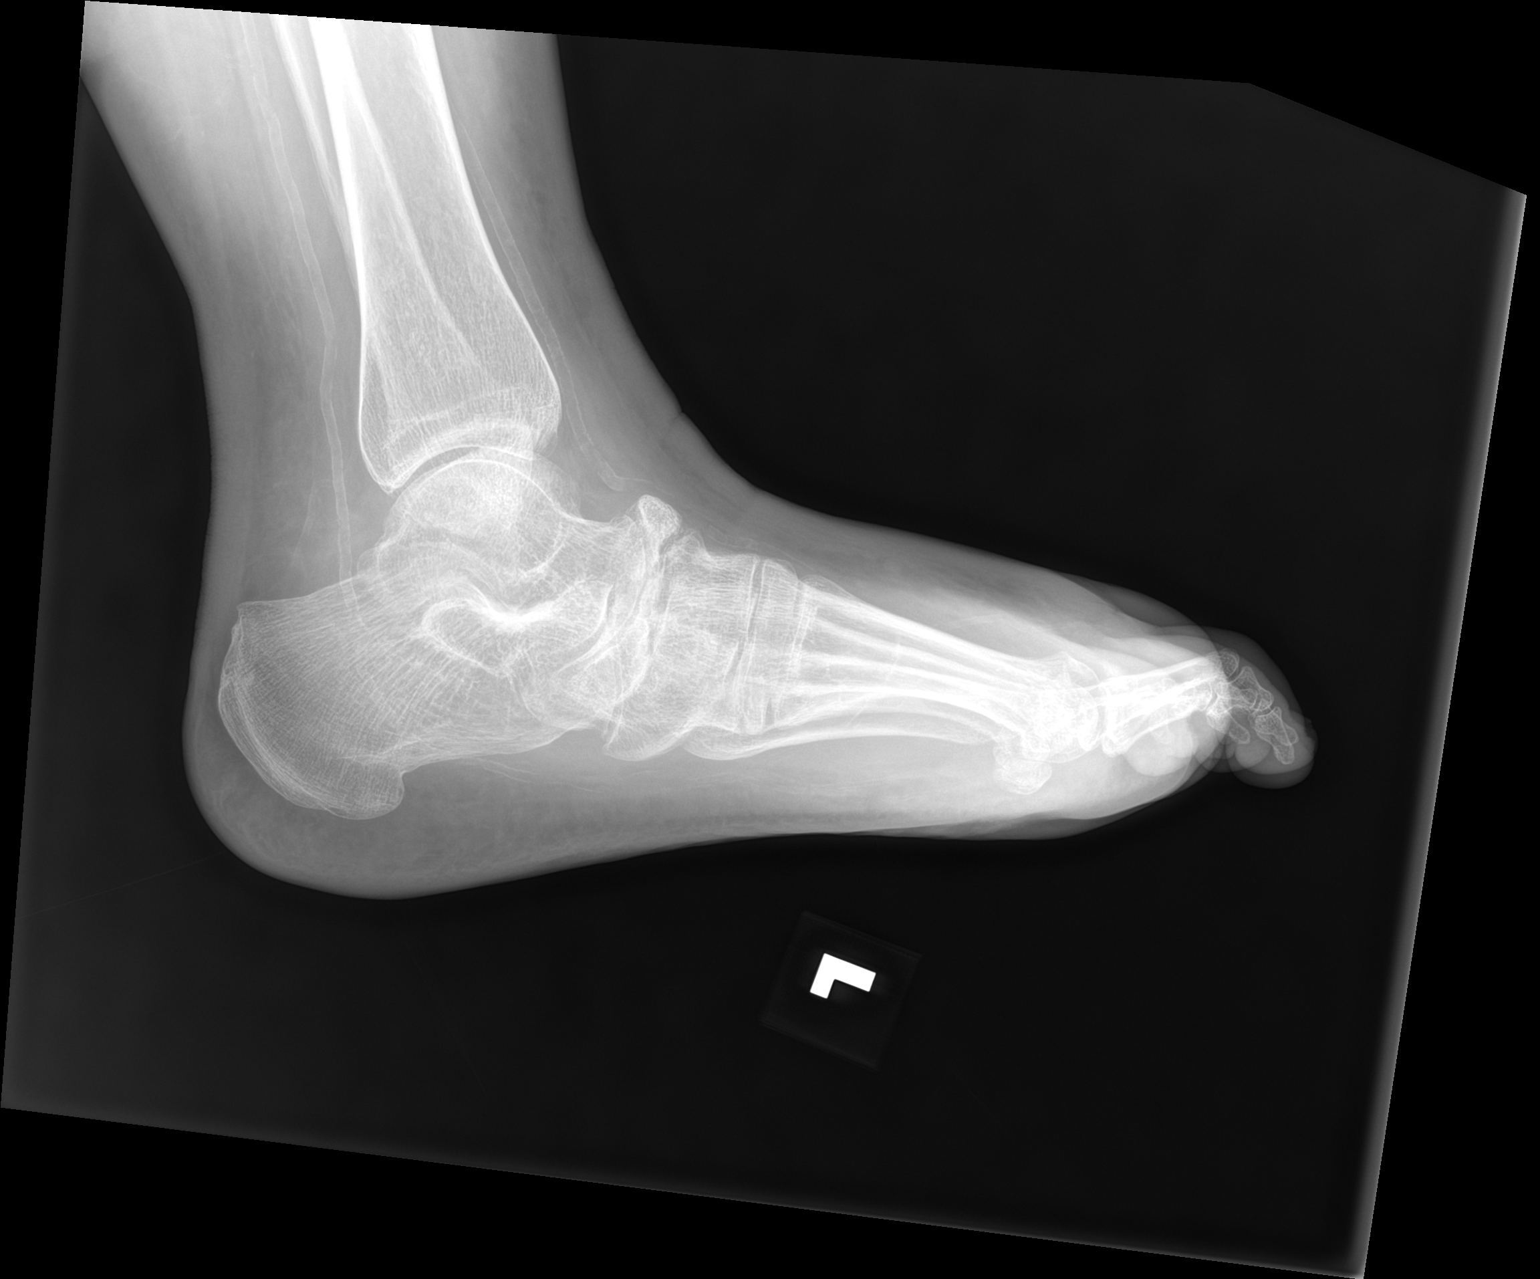

[3 of 3 positions shown; findings below may reference images not displayed]

FINDINGS: Changes consistent with first digit amputation are again noted and
stable. Soft tissue emphysema is noted adjacent to the head of the
fifth metatarsal consistent with soft tissue wound. Generalized soft
tissue swelling is noted in this region as well. No definitive
osteomyelitis is noted. Degenerative changes of the tarsal bones are
seen with findings likely related to avascular necrosis of the
saccular bone.
IMPRESSION: Soft tissue emphysema adjacent to the fifth metatarsal consistent
with focal wound. No bony erosive changes to suggest osteomyelitis
are noted.

Postoperative changes consistent with the given clinical history.

## 2021-01-02 MED ORDER — LACTATED RINGERS IV BOLUS
1000.0000 mL | Freq: Once | INTRAVENOUS | Status: AC
Start: 2021-01-02 — End: 2021-01-03
  Administered 2021-01-03: 1000 mL via INTRAVENOUS

## 2021-01-02 MED ORDER — HYDROMORPHONE HCL 1 MG/ML IJ SOLN
1.0000 mg | Freq: Once | INTRAMUSCULAR | Status: AC
  Administered 2021-01-03: 1 mg via INTRAVENOUS
  Filled 2021-01-02: qty 1

## 2021-01-02 MED ORDER — HALOPERIDOL LACTATE 5 MG/ML IJ SOLN
2.0000 mg | Freq: Once | INTRAMUSCULAR | Status: AC
  Administered 2021-01-02: 2 mg via INTRAVENOUS
  Filled 2021-01-02: qty 1

## 2021-01-02 NOTE — ED Provider Notes (Signed)
Emergency Medicine Provider Triage Evaluation Note  [name] , a 30 y.o. female  was evaluated in triage.  Pt complains of N/V since yesterday. Reports 7-8 episodes of emesis per day, not keeping anything down, tried suppository phenergan without relief. Having some mild generalized abdominal pain & back pain from vomiting. Has had baseline diarrhea. Also noted increased pain and concern for sores to her left foot- she is S/p recent amputation.   Review of Systems  Positive: N/v, abdominal pain, diarrhea, back pain, foot pain.  Negative: Hematemesis, melena, hematochezia, dysuria.   Physical Exam  BP (!) 188/106 (BP Location: Right Arm)   Pulse 97   Temp 98.2 F (36.8 C) (Oral)   Resp 18   SpO2 100%  Gen:   Awake, no tearful.  Resp:  Normal effort  MSK:   L 1st toe amputation. Small sore to medial and lateral forefoot, no purulence drainage at this time. 2+ DP pulse.  Other:  Mild epigastric TTP, no peritoneal signs.   Medical Decision Making  Medically screening exam initiated at 10:27 PM.  Appropriate orders placed.  [name] was informed that the remainder of the evaluation will be completed by another provider, this initial triage assessment does not replace that evaluation, and the importance of remaining in the ED until their evaluation is complete.  N/V.    Leafy Kindle 01/02/21 2229    Wyvonnia Dusky, MD 01/02/21 2239

## 2021-01-02 NOTE — ED Triage Notes (Signed)
Pt here c/o back pain and nausea x1 day. Pt reports her husband has strep. Also reports recent toe amputation on L foot, w/ 2 sores, pt thinks they might be infected.

## 2021-01-02 NOTE — ED Provider Notes (Signed)
[facility] Emergency Department Provider Note MRN:  [id number]  Arrival date & time: 01/03/21     Chief Complaint   Nausea and Abdominal Pain   History of Present Illness   [name] is a 30 y.o. year-old female with a history of diabetes, gastroparesis presenting to the ED with chief complaint of nausea and abdominal pain.  Location: Epigastrium with radiation to the back Duration: 1 or 2 days Onset: Gradual Timing: Constant Description: Burning pressure Severity: Severe Exacerbating/Alleviating Factors: None Associated Symptoms: Multiple episodes of vomiting with nausea, also having some new complaints or wounds/sores to her total Pertinent Negatives: No fever, no chest pain or shortness of breath, no lower abdominal pain  Additional History: None  Review of Systems  A complete 10 system review of systems was obtained and all systems are negative except as noted in the HPI and PMH.   Patient's Health History    Past Medical History:  Diagnosis Date   Allergy    Anemia    Anxiety    Blood transfusion without reported diagnosis    Dec 2018   Cataract    right eye   COVID-19 03/2020   Depression    Diabetes type 1, uncontrolled 11/14/2011   Since age 6   Fibromyalgia    Gastroparesis    GERD (gastroesophageal reflux disease)    Hypertension    Infection    UTI April 2016    Past Surgical History:  Procedure Laterality Date   AMPUTATION Right 04/13/2020   Procedure: 1ST RAY AMPUTATION RIGHT FOOT;  Surgeon: Newt Minion, MD;  Location: [place];  Service: Orthopedics;  Laterality: Right;   AMPUTATION Right 06/06/2020   Procedure: RIGHT BELOW KNEE AMPUTATION;  Surgeon: Newt Minion, MD;  Location: [place];  Service: Orthopedics;  Laterality: Right;   AMPUTATION Left    great toe   ANKLE SURGERY     APPLICATION OF WOUND VAC Right 06/06/2020   Procedure: APPLICATION OF WOUND VAC;  Surgeon: Newt Minion, MD;  Location: [place];   Service: Orthopedics;  Laterality: Right;   CHOLECYSTECTOMY  11/15/2011   Procedure: LAPAROSCOPIC CHOLECYSTECTOMY WITH INTRAOPERATIVE CHOLANGIOGRAM;  Surgeon: [name], MD;  Location: [facility];  Service: General;  Laterality: N/A;   COLONOSCOPY     COLONOSCOPY WITH PROPOFOL N/A 06/27/2017   Procedure: COLONOSCOPY WITH PROPOFOL;  Surgeon: Milus Banister, MD;  Location: [facility];  Service: Endoscopy;  Laterality: N/A;   ESOPHAGOGASTRODUODENOSCOPY  12/03/2011   Procedure: ESOPHAGOGASTRODUODENOSCOPY (EGD);  Surgeon: Beryle Beams, MD;  Location: [facility];  Service: Endoscopy;  Laterality: N/A;   FLEXIBLE SIGMOIDOSCOPY N/A 03/10/2017   Procedure: FLEXIBLE SIGMOIDOSCOPY;  Surgeon: Carol Ada, MD;  Location: [facility];  Service: Endoscopy;  Laterality: N/A;   INCISION AND DRAINAGE PERIRECTAL ABSCESS N/A 03/01/2017   Procedure: IRRIGATION AND DEBRIDEMENT PERIRECTAL ABSCESS;  Surgeon: Alphonsa Overall, MD;  Location: [facility];  Service: General;  Laterality: N/A;   IRRIGATION AND DEBRIDEMENT BUTTOCKS N/A 03/23/2017   Procedure: IRRIGATION AND DEBRIDEMENT BUTTOCKS, SETON PLACEMENT;  Surgeon: [name], MD;  Location: [facility];  Service: General;  Laterality: N/A;   LAPAROSCOPY  11/23/2011   Procedure: LAPAROSCOPY DIAGNOSTIC;  Surgeon: Edward Jolly, MD;  Location: [facility];  Service: General;  Laterality: N/A;   SIGMOIDOSCOPY     UPPER GASTROINTESTINAL ENDOSCOPY     WISDOM TOOTH EXTRACTION      Family History  Problem Relation Age of Onset   Diabetes Mother  Hypertension Father    Colon cancer Paternal Grandmother        pt thinks PGM was dx in her 23's   Diabetes Paternal Grandmother    Diabetes Maternal Grandmother    Diabetes Maternal Grandfather    Diabetes Paternal Grandfather    Diabetes Other    Breast cancer Paternal Aunt    Esophageal cancer Neg Hx    Liver cancer Neg Hx    Pancreatic cancer Neg Hx    Stomach cancer Neg Hx    Rectal cancer Neg Hx      Social History   Socioeconomic History   Marital status: Married    Spouse name: [name]   Number of children: 0   Years of education: college   Highest education level: Not on file  Occupational History   Occupation: Publishing rights manager call center    Employer: [employer]  Tobacco Use   Smoking status: Never   Smokeless tobacco: Never  Vaping Use   Vaping Use: Never used  Substance and Sexual Activity   Alcohol use: No    Alcohol/week: 0.0 standard drinks   Drug use: Not Currently    Types: Marijuana   Sexual activity: Not Currently    Partners: Male    Birth control/protection: None  Other Topics Concern   Not on file  Social History Narrative   Not on file   Social Determinants of Health   Financial Resource Strain: Not on file  Food Insecurity: Food Insecurity Present   Worried About [place] in the Last Year: Sometimes true   Ran Out of Food in the Last Year: Sometimes true  Transportation Needs: No Transportation Needs   Lack of Transportation (Medical): No   Lack of Transportation (Non-Medical): No  Physical Activity: Not on file  Stress: Not on file  Social Connections: Not on file  Intimate Partner Violence: Not on file     Physical Exam   Vitals:   01/03/21 0009 01/03/21 0100  BP: (!) 180/102 (!) 159/83  Pulse: 96 81  Resp: 16 14  Temp:    SpO2: 100% 100%    CONSTITUTIONAL: Well-appearing, in moderate distress due to pain NEURO:  Alert and oriented x 3, no focal deficits EYES:  eyes equal and reactive ENT/NECK:  no LAD, no JVD CARDIO: Regular rate, well-perfused, normal S1 and S2 PULM:  CTAB no wheezing or rhonchi GI/GU:  normal bowel sounds, non-distended, non-tender MSK/SPINE:  No gross deformities, no edema SKIN:  no rash, atraumatic PSYCH:  Appropriate speech and behavior  *Additional and/or pertinent findings included in MDM below  Diagnostic and Interventional Summary    EKG Interpretation  Date/Time:    Ventricular  Rate:    PR Interval:    QRS Duration:   QT Interval:    QTC Calculation:   R Axis:     Text Interpretation:         Labs Reviewed  COMPREHENSIVE METABOLIC PANEL - Abnormal; Notable for the following components:      Result Value   Sodium 133 (*)    Potassium 5.7 (*)    CO2 19 (*)    Glucose, Bld 247 (*)    BUN 23 (*)    Creatinine, Ser 1.84 (*)    Calcium 8.4 (*)    Albumin 2.2 (*)    AST 42 (*)    Total Bilirubin 1.6 (*)    GFR, Estimated 37 (*)    All other components within normal limits  CBC - Abnormal; Notable for the following components:   RBC 3.33 (*)    Hemoglobin 9.4 (*)    HCT 29.0 (*)    All other components within normal limits  URINALYSIS, ROUTINE W REFLEX MICROSCOPIC - Abnormal; Notable for the following components:   Glucose, UA >=500 (*)    Hgb urine dipstick SMALL (*)    Protein, ur >=300 (*)    All other components within normal limits  CBG MONITORING, ED - Abnormal; Notable for the following components:   Glucose-Capillary 222 (*)    All other components within normal limits  I-STAT BETA HCG BLOOD, ED ([facility], [facility], [facility] ONLY) - Abnormal; Notable for the following components:   I-stat hCG, quantitative 6.3 (*)    All other components within normal limits  LIPASE, BLOOD  HCG, QUANTITATIVE, PREGNANCY    DG Foot Complete Left  Final Result      Medications  sodium chloride 0.9 % bolus 1,000 mL (has no administration in time range)  lactated ringers bolus 1,000 mL (1,000 mLs Intravenous New Bag/Given 01/03/21 0007)  haloperidol lactate (HALDOL) injection 2 mg (2 mg Intravenous Given 01/02/21 2328)  HYDROmorphone (DILAUDID) injection 1 mg (1 mg Intravenous Given 01/03/21 0011)     Procedures  /  Critical Care Procedures  ED Course and Medical Decision Making  I have reviewed the triage vital signs, the nursing notes, and pertinent available records from the EMR.  Listed above are laboratory and imaging tests that I personally ordered, reviewed, and  interpreted and then considered in my medical decision making (see below for details).  Suspect recurrence of gastroparesis flare, also considering metabolic/electrolyte disturbance, DKA, pancreatitis.  Abdomen is overall reassuring on exam, vital signs relatively normal, hypertensive in the setting of pain.  Providing symptomatic management and will reassess.  Awaiting labs.  Left great toe has a small ulcer type wound that overall seems to be well-healing.     Work-up is reassuring, patient's symptoms are well controlled and she is resting comfortably, wakes easily, agreeable for discharge and follow-up with her regular doctors.  Barth Kirks. Sedonia Small, [place] [email or web address]  Final Clinical Impressions(s) / ED Diagnoses     ICD-10-CM   1. Epigastric pain  R10.13     2. Nausea and vomiting, unspecified vomiting type  R11.2       ED Discharge Orders     None        Discharge Instructions Discussed with and Provided to Patient:     Discharge Instructions      You were evaluated in the Emergency Department and after careful evaluation, we did not find any emergent condition requiring admission or further testing in the hospital.  Your exam/testing today is overall reassuring.  Symptoms likely due to a flare of your gastroparesis.  Recommend continued use of your home medications and follow-up with your regular doctors.  Please return to the Emergency Department if you experience any worsening of your condition.   Thank you for allowing [place] to be a part of your care.        Maudie Flakes, MD 01/03/21 [phone]

## 2021-01-02 NOTE — Telephone Encounter (Signed)
..   Medicaid Managed Care   Unsuccessful Outreach Note  10/30/4479 Name: [name] MRN: [id number] DOB: [birth year]  Referred by: [name], MD Reason for referral : High Risk Managed Medicaid (I called the patient today to get her phone visit with the [facility] RNCM rescheduled. I left my name and number on her VM.)   A second unsuccessful telephone outreach was attempted today. The patient was referred to the case management team for assistance with care management and care coordination.   Follow Up Plan: The care management team will reach out to the patient again over the next 7 days.   [place]

## 2021-01-03 ENCOUNTER — Other Ambulatory Visit: Payer: Self-pay

## 2021-01-03 ENCOUNTER — Encounter (HOSPITAL_COMMUNITY): Payer: Self-pay

## 2021-01-03 ENCOUNTER — Ambulatory Visit (HOSPITAL_COMMUNITY): Admission: EM | Admit: 2021-01-03 | Discharge: 2021-01-03 | Payer: Medicaid Other

## 2021-01-03 ENCOUNTER — Encounter (HOSPITAL_COMMUNITY): Payer: Self-pay | Admitting: Emergency Medicine

## 2021-01-03 ENCOUNTER — Emergency Department (HOSPITAL_COMMUNITY)
Admission: EM | Admit: 2021-01-03 | Discharge: 2021-01-04 | Disposition: A | Discharge status: Home / Self Care | Payer: Medicaid Other | Source: Home / Self Care | Attending: Student | Admitting: Student

## 2021-01-03 ENCOUNTER — Emergency Department (HOSPITAL_COMMUNITY): Payer: Medicaid Other

## 2021-01-03 DIAGNOSIS — R1013 Epigastric pain: Secondary | ICD-10-CM | POA: Insufficient documentation

## 2021-01-03 DIAGNOSIS — I10 Essential (primary) hypertension: Secondary | ICD-10-CM | POA: Insufficient documentation

## 2021-01-03 DIAGNOSIS — R112 Nausea with vomiting, unspecified: Secondary | ICD-10-CM | POA: Insufficient documentation

## 2021-01-03 DIAGNOSIS — R0602 Shortness of breath: Secondary | ICD-10-CM | POA: Diagnosis not present

## 2021-01-03 DIAGNOSIS — Z5321 Procedure and treatment not carried out due to patient leaving prior to being seen by health care provider: Secondary | ICD-10-CM | POA: Insufficient documentation

## 2021-01-03 DIAGNOSIS — R0789 Other chest pain: Secondary | ICD-10-CM | POA: Diagnosis not present

## 2021-01-03 LAB — I-STAT BETA HCG BLOOD, ED (MC, WL, AP ONLY)
I-stat hCG, quantitative: 6.3 m[IU]/mL — ABNORMAL HIGH (ref <5)
I-stat hCG, quantitative: <5 m[IU]/mL (ref <5)
I-stat hCG, quantitative: <5 m[IU]/mL (ref <5)

## 2021-01-03 LAB — CBC WITH DIFFERENTIAL/PLATELET
Abs Immature Granulocytes: 0.04 10*3/uL (ref 0.00–0.07)
Basophils Absolute: 0.1 10*3/uL (ref 0.0–0.1)
Basophils Relative: 1 %
Eosinophils Absolute: 0.1 10*3/uL (ref 0.0–0.5)
Eosinophils Relative: 1 %
HCT: 29.8 % — ABNORMAL LOW (ref 36.0–46.0)
Hemoglobin: 9.7 g/dL — ABNORMAL LOW (ref 12.0–15.0)
Immature Granulocytes: 1 %
Lymphocytes Relative: 16 %
Lymphs Abs: 1.2 10*3/uL (ref 0.7–4.0)
MCH: 28 pg (ref 26.0–34.0)
MCHC: 32.6 g/dL (ref 30.0–36.0)
MCV: 86.1 fL (ref 80.0–100.0)
Monocytes Absolute: 0.4 10*3/uL (ref 0.1–1.0)
Monocytes Relative: 6 %
Neutro Abs: 5.8 10*3/uL (ref 1.7–7.7)
Neutrophils Relative %: 75 %
Platelets: 239 10*3/uL (ref 150–400)
RBC: 3.46 MIL/uL — ABNORMAL LOW (ref 3.87–5.11)
RDW: 12.9 % (ref 11.5–15.5)
WBC: 7.6 10*3/uL (ref 4.0–10.5)
nRBC: 0 % (ref 0.0–0.2)

## 2021-01-03 LAB — COMPREHENSIVE METABOLIC PANEL
ALT: 18 U/L (ref 0–44)
ALT: 19 U/L (ref 0–44)
AST: 42 U/L — ABNORMAL HIGH (ref 15–41)
AST: 25 U/L (ref 15–41)
Albumin: 2.2 g/dL — ABNORMAL LOW (ref 3.5–5.0)
Albumin: 2.2 g/dL — ABNORMAL LOW (ref 3.5–5.0)
Alkaline Phosphatase: 115 U/L (ref 38–126)
Alkaline Phosphatase: 117 U/L (ref 38–126)
Anion gap: 9 (ref 5–15)
Anion gap: 10 (ref 5–15)
BUN: 23 mg/dL — ABNORMAL HIGH (ref 6–20)
BUN: 20 mg/dL (ref 6–20)
CO2: 19 mmol/L — ABNORMAL LOW (ref 22–32)
CO2: 19 mmol/L — ABNORMAL LOW (ref 22–32)
Calcium: 8.4 mg/dL — ABNORMAL LOW (ref 8.9–10.3)
Calcium: 8.8 mg/dL — ABNORMAL LOW (ref 8.9–10.3)
Chloride: 105 mmol/L (ref 98–111)
Chloride: 107 mmol/L (ref 98–111)
Creatinine, Ser: 1.84 mg/dL — ABNORMAL HIGH (ref 0.44–1.00)
Creatinine, Ser: 1.82 mg/dL — ABNORMAL HIGH (ref 0.44–1.00)
GFR, Estimated: 37 mL/min — ABNORMAL LOW (ref >60)
GFR, Estimated: 38 mL/min — ABNORMAL LOW (ref >60)
Glucose, Bld: 247 mg/dL — ABNORMAL HIGH (ref 70–99)
Glucose, Bld: 204 mg/dL — ABNORMAL HIGH (ref 70–99)
Potassium: 5.7 mmol/L — ABNORMAL HIGH (ref 3.5–5.1)
Potassium: 3.4 mmol/L — ABNORMAL LOW (ref 3.5–5.1)
Sodium: 133 mmol/L — ABNORMAL LOW (ref 135–145)
Sodium: 136 mmol/L (ref 135–145)
Total Bilirubin: 1.6 mg/dL — ABNORMAL HIGH (ref 0.3–1.2)
Total Bilirubin: 0.5 mg/dL (ref 0.3–1.2)
Total Protein: 6.7 g/dL (ref 6.5–8.1)
Total Protein: 6.9 g/dL (ref 6.5–8.1)

## 2021-01-03 LAB — URINALYSIS, ROUTINE W REFLEX MICROSCOPIC
Bacteria, UA: NONE SEEN
Bilirubin Urine: NEGATIVE
Glucose, UA: >=500 mg/dL — AB
Ketones, ur: NEGATIVE mg/dL
Leukocytes,Ua: NEGATIVE
Nitrite: NEGATIVE
Protein, ur: >=300 mg/dL — AB
Specific Gravity, Urine: 1.021 (ref 1.005–1.030)
pH: 6 (ref 5.0–8.0)

## 2021-01-03 LAB — TROPONIN I (HIGH SENSITIVITY)
Troponin I (High Sensitivity): 8 ng/L (ref <18)
Troponin I (High Sensitivity): 9 ng/L (ref <18)

## 2021-01-03 LAB — HCG, QUANTITATIVE, PREGNANCY: hCG, Beta Chain, Quant, S: 3 m[IU]/mL (ref <5)

## 2021-01-03 LAB — LIPASE, BLOOD
Lipase: 21 U/L (ref 11–51)
Lipase: 22 U/L (ref 11–51)

## 2021-01-03 IMAGING — CR DG CHEST 2V
2 series · 2 of 2 positions shown · non-contrast
Comparison: [DATE]

CLINICAL DATA: Shortness of breath with nausea and vomiting.

EXAM:
CHEST - 2 VIEW

[chest pa]
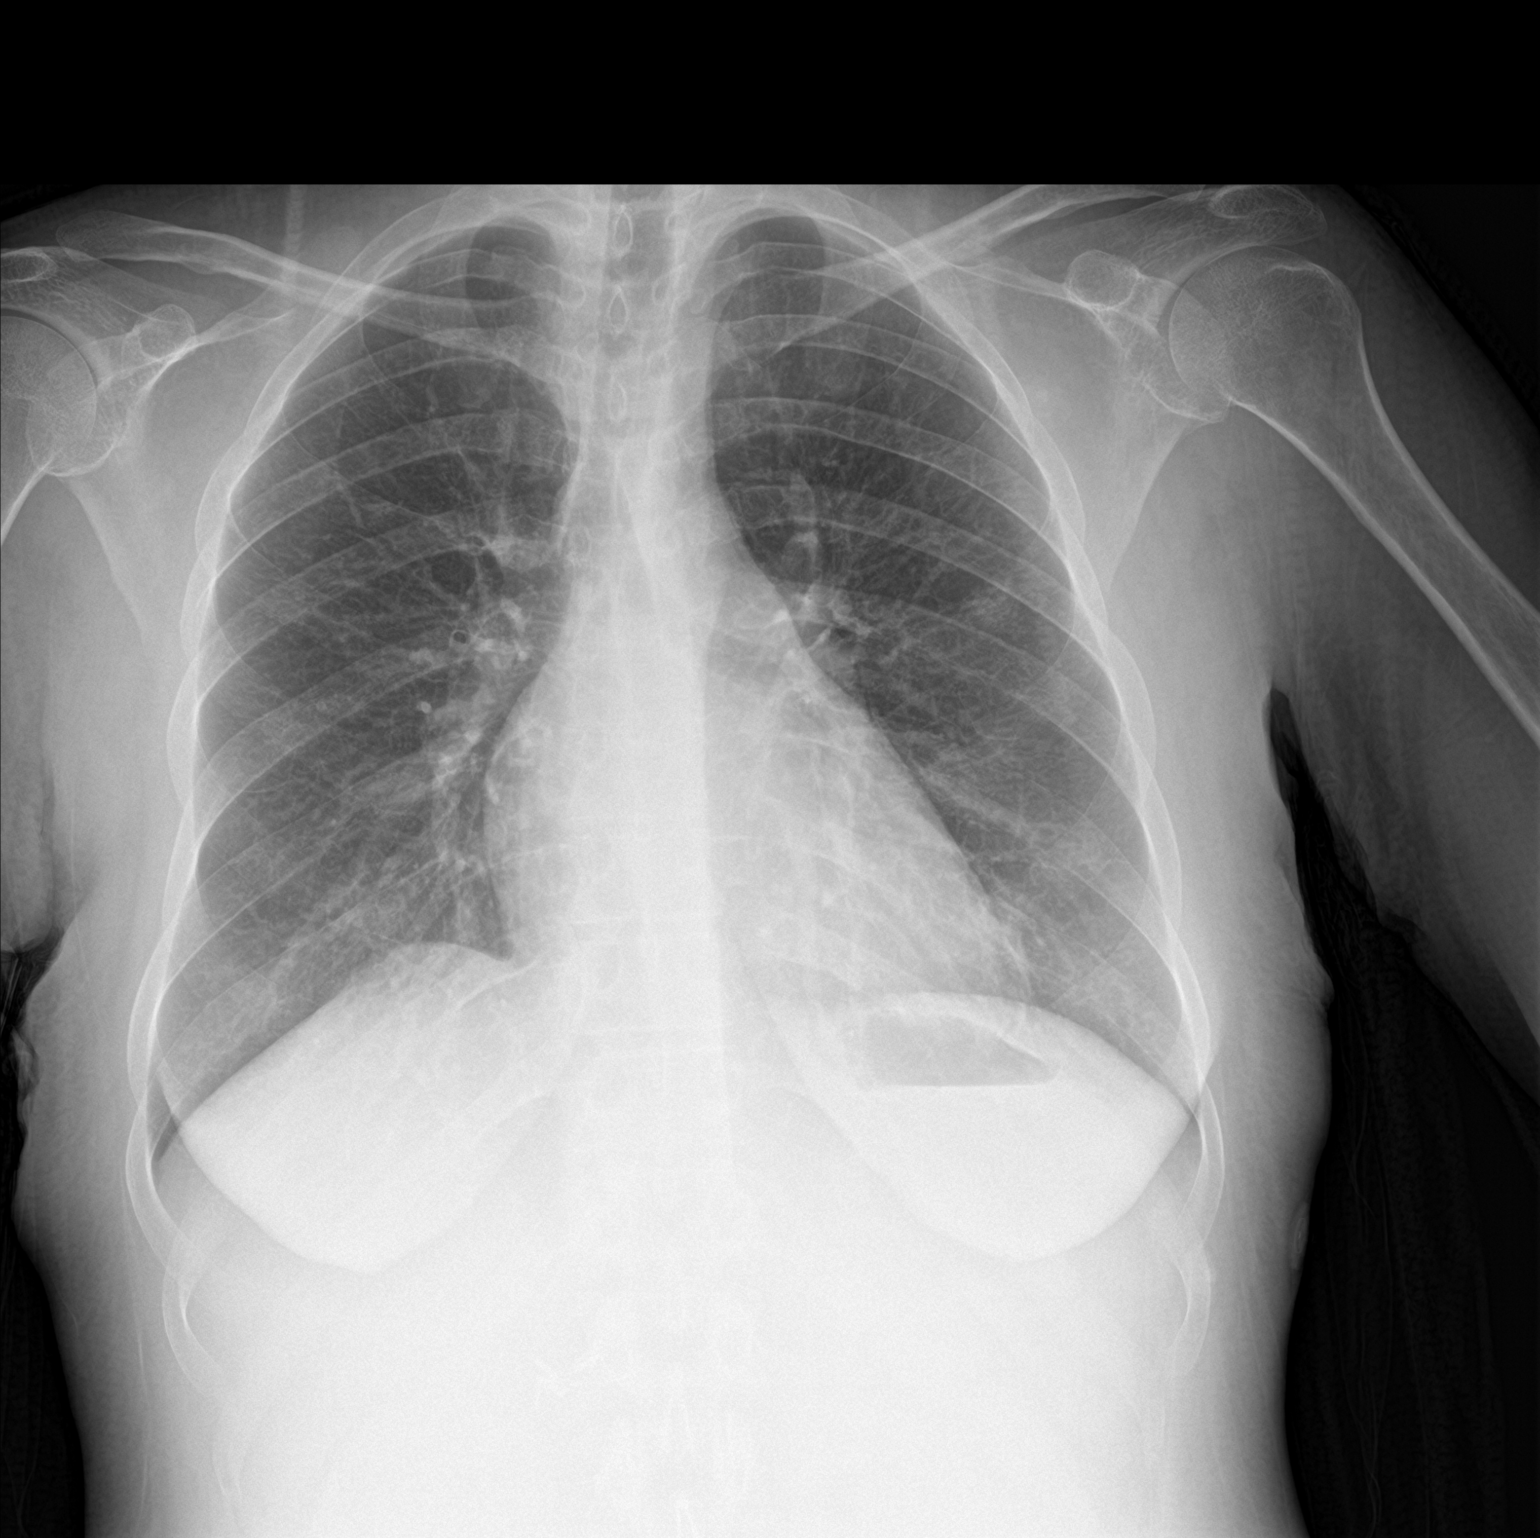

[chest lat]
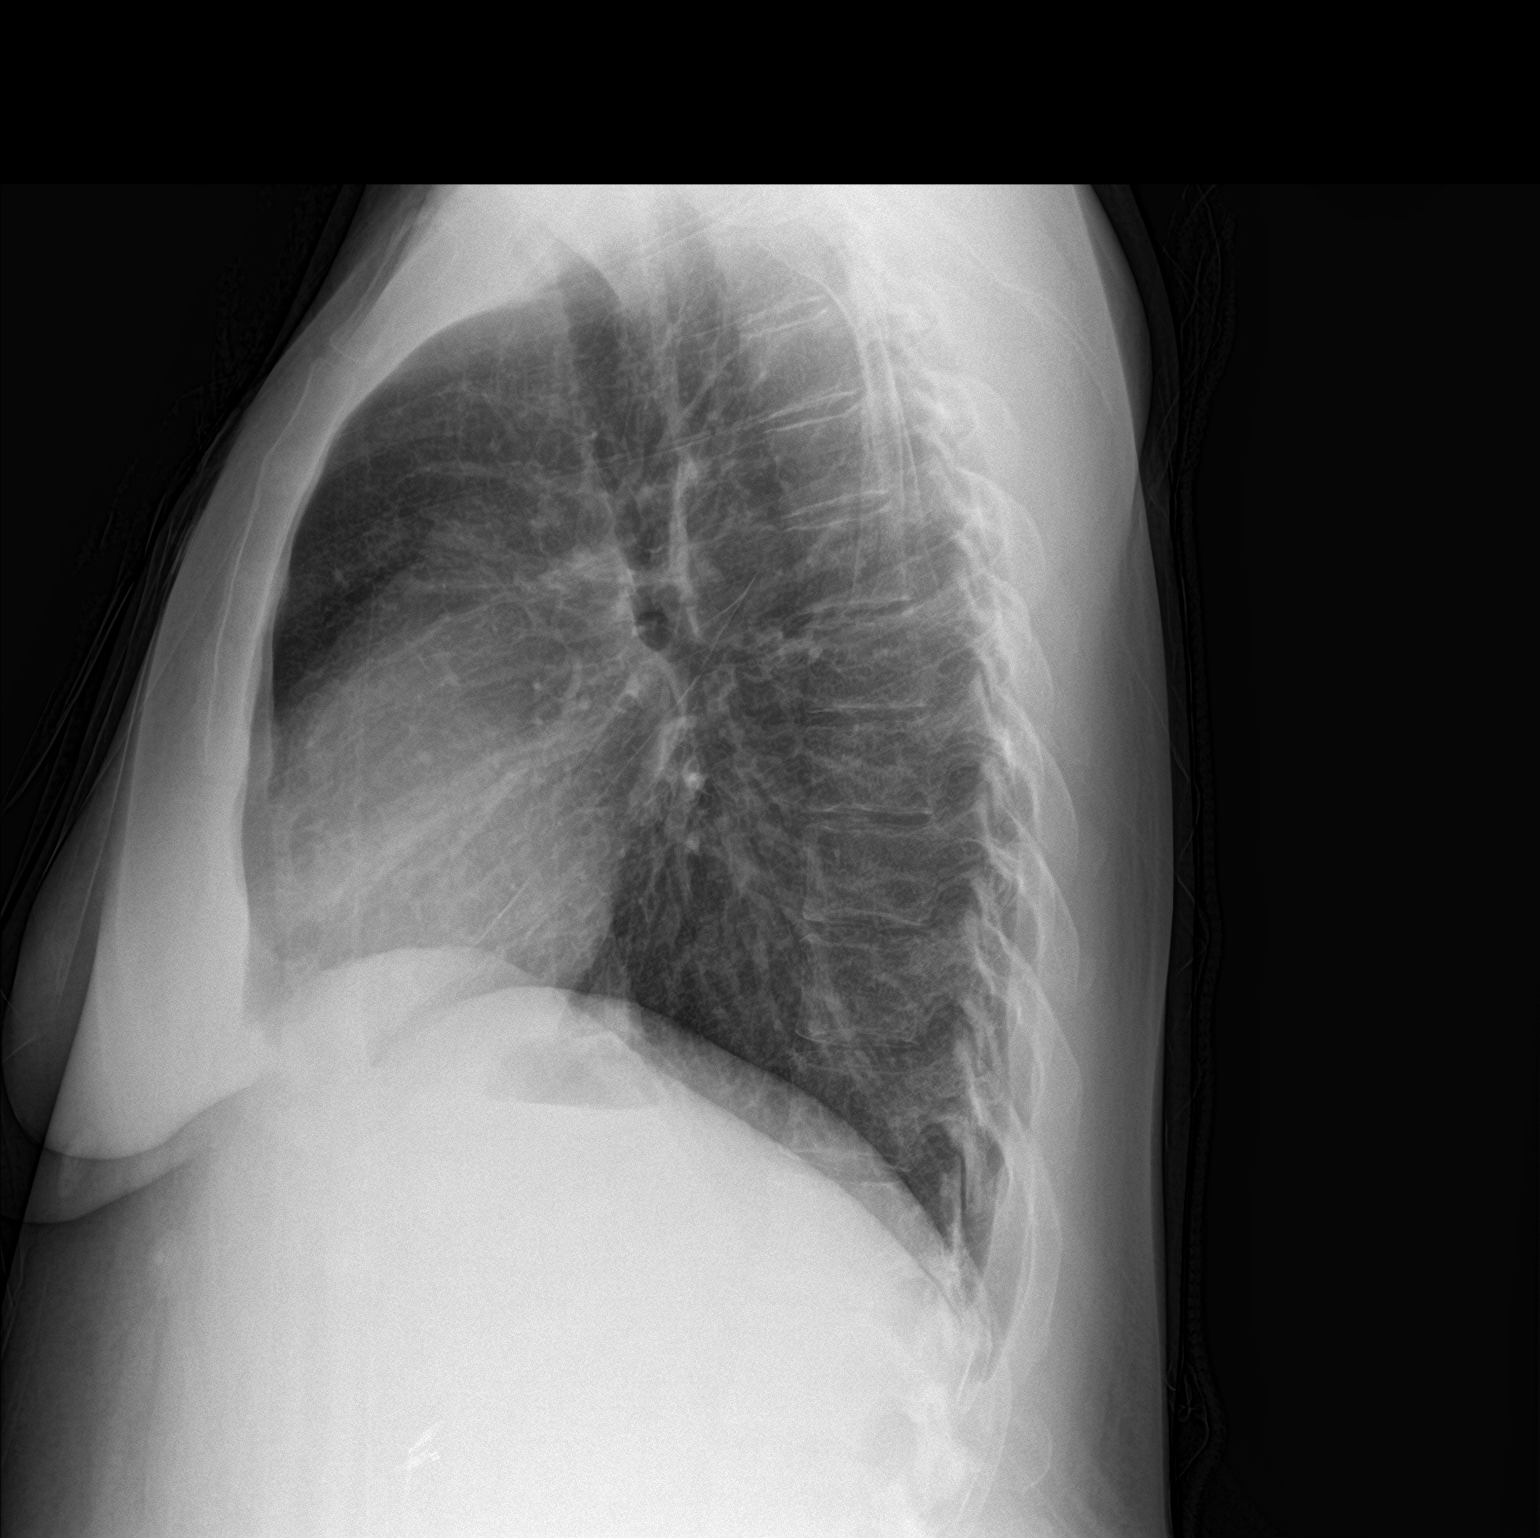

[2 of 2 positions shown; findings below may reference images not displayed]

FINDINGS: The heart size and mediastinal contours are within normal limits.
Both lungs are clear. Radiopaque surgical clips are seen overlying
the right upper quadrant the visualized skeletal structures are
unremarkable.
IMPRESSION: No active cardiopulmonary disease.

## 2021-01-03 MED ORDER — SODIUM CHLORIDE 0.9 % IV BOLUS
1000.0000 mL | Freq: Once | INTRAVENOUS | Status: AC
  Administered 2021-01-03: 1000 mL via INTRAVENOUS

## 2021-01-03 MED ORDER — ONDANSETRON 4 MG PO TBDP: 4.0000 mg | ORAL_TABLET | Freq: Once | ORAL | Status: DC

## 2021-01-03 NOTE — ED Triage Notes (Signed)
Pt c/o of back pain and vomiting x 2 days.

## 2021-01-03 NOTE — Discharge Instructions (Addendum)
You were evaluated in the Emergency Department and after careful evaluation, we did not find any emergent condition requiring admission or further testing in the hospital.  Your exam/testing today is overall reassuring.  Symptoms likely due to a flare of your gastroparesis.  Recommend continued use of your home medications and follow-up with your regular doctors.  Please return to the Emergency Department if you experience any worsening of your condition.   Thank you for allowing [place] to be a part of your care.

## 2021-01-03 NOTE — ED Notes (Signed)
Pt referred to the ED due to her BP

## 2021-01-03 NOTE — ED Triage Notes (Signed)
Pt reports epigastric pain, n/v, hx of gastroparesis. HTN in triage

## 2021-01-03 NOTE — Telephone Encounter (Signed)
Rx sent to Dr. Jerline Pain for approval.

## 2021-01-03 NOTE — ED Provider Notes (Signed)
Emergency Medicine Provider Triage Evaluation Note  [name] , a 30 y.o. female  was evaluated in triage.  Pt complains of epigastric pain and emesis.  Began a few days ago.  Has history of gastroparesis, states this feels similar.  She also notes she has had some mild chest burning.  Lower extremity edema, history of PE or DVT.  Multiple episodes of NBNB emesis.  States she has been taking her Reglan without relief.  History of AAA, dissection, connective tissue disorder  Review of Systems  Positive: Abd pain, emesis Negative: Numbness, weakness  Physical Exam  BP (!) 210/114 (BP Location: Right Arm)   Pulse 96   Temp 99 F (37.2 C) (Oral)   Resp 18   SpO2 98%  Gen:   Awake, Crying, dry heaving Resp:  Normal effort  MSK:   Moves extremities without difficulty  ABD:  Diffuse tenderness Other:    Medical Decision Making  Medically screening exam initiated at 6:33 PM.  Appropriate orders placed.  Junette Bernat Madril was informed that the remainder of the evaluation will be completed by another provider, this initial triage assessment does not replace that evaluation, and the importance of remaining in the ED until their evaluation is complete.  Epigastric pain, emesis  2 CT [facility] within the last month, VQ scan without acute findings 2 weeks ago. Will hold on imaging until provider exam in back given patient states sx similar to previous gastroparesis episodes.   Nettie Elm, PA-C 01/03/21 1836    Teressa Lower, MD 01/03/21 [phone]

## 2021-01-03 NOTE — Telephone Encounter (Signed)
  Encourage patient to contact the pharmacy for refills or they can request refills through [place]: 12/26/2020  NEXT APPOINTMENT DATE:  MEDICATION: oxyCODONE-acetaminophen (PERCOCET) 10-325 MG tablet  Is the patient out of medication? yes  PHARMACY: [facility] [place], [place]  Let patient know to contact pharmacy at the end of the day to make sure medication is ready.  Please notify patient to allow 48-72 hours to process

## 2021-01-04 ENCOUNTER — Other Ambulatory Visit: Payer: Self-pay

## 2021-01-04 ENCOUNTER — Encounter (HOSPITAL_COMMUNITY): Payer: Self-pay

## 2021-01-04 ENCOUNTER — Emergency Department (HOSPITAL_COMMUNITY)
Admission: EM | Admit: 2021-01-04 | Discharge: 2021-01-04 | Disposition: A | Discharge status: Home / Self Care | Payer: Medicaid Other | Attending: Emergency Medicine | Admitting: Emergency Medicine

## 2021-01-04 DIAGNOSIS — I130 Hypertensive heart and chronic kidney disease with heart failure and stage 1 through stage 4 chronic kidney disease, or unspecified chronic kidney disease: Secondary | ICD-10-CM | POA: Diagnosis not present

## 2021-01-04 DIAGNOSIS — I5032 Chronic diastolic (congestive) heart failure: Secondary | ICD-10-CM | POA: Diagnosis not present

## 2021-01-04 DIAGNOSIS — N183 Chronic kidney disease, stage 3 unspecified: Secondary | ICD-10-CM | POA: Insufficient documentation

## 2021-01-04 DIAGNOSIS — E1022 Type 1 diabetes mellitus with diabetic chronic kidney disease: Secondary | ICD-10-CM | POA: Insufficient documentation

## 2021-01-04 DIAGNOSIS — R112 Nausea with vomiting, unspecified: Secondary | ICD-10-CM | POA: Diagnosis not present

## 2021-01-04 DIAGNOSIS — R531 Weakness: Secondary | ICD-10-CM | POA: Insufficient documentation

## 2021-01-04 DIAGNOSIS — R110 Nausea: Secondary | ICD-10-CM

## 2021-01-04 DIAGNOSIS — Z79899 Other long term (current) drug therapy: Secondary | ICD-10-CM | POA: Insufficient documentation

## 2021-01-04 DIAGNOSIS — Z8616 Personal history of COVID-19: Secondary | ICD-10-CM | POA: Diagnosis not present

## 2021-01-04 DIAGNOSIS — M549 Dorsalgia, unspecified: Secondary | ICD-10-CM | POA: Insufficient documentation

## 2021-01-04 DIAGNOSIS — R079 Chest pain, unspecified: Secondary | ICD-10-CM | POA: Diagnosis present

## 2021-01-04 DIAGNOSIS — I160 Hypertensive urgency: Secondary | ICD-10-CM | POA: Diagnosis not present

## 2021-01-04 DIAGNOSIS — Z794 Long term (current) use of insulin: Secondary | ICD-10-CM | POA: Diagnosis not present

## 2021-01-04 LAB — CBC WITH DIFFERENTIAL/PLATELET
Abs Immature Granulocytes: 0.06 10*3/uL (ref 0.00–0.07)
Basophils Absolute: 0.1 10*3/uL (ref 0.0–0.1)
Basophils Relative: 1 %
Eosinophils Absolute: 0.1 10*3/uL (ref 0.0–0.5)
Eosinophils Relative: 2 %
HCT: 28 % — ABNORMAL LOW (ref 36.0–46.0)
Hemoglobin: 9.1 g/dL — ABNORMAL LOW (ref 12.0–15.0)
Immature Granulocytes: 1 %
Lymphocytes Relative: 18 %
Lymphs Abs: 1.4 10*3/uL (ref 0.7–4.0)
MCH: 28.2 pg (ref 26.0–34.0)
MCHC: 32.5 g/dL (ref 30.0–36.0)
MCV: 86.7 fL (ref 80.0–100.0)
Monocytes Absolute: 0.6 10*3/uL (ref 0.1–1.0)
Monocytes Relative: 8 %
Neutro Abs: 5.6 10*3/uL (ref 1.7–7.7)
Neutrophils Relative %: 70 %
Platelets: 207 10*3/uL (ref 150–400)
RBC: 3.23 MIL/uL — ABNORMAL LOW (ref 3.87–5.11)
RDW: 13.1 % (ref 11.5–15.5)
WBC: 7.9 10*3/uL (ref 4.0–10.5)
nRBC: 0 % (ref 0.0–0.2)

## 2021-01-04 LAB — URINALYSIS, ROUTINE W REFLEX MICROSCOPIC
Bacteria, UA: NONE SEEN
Bilirubin Urine: NEGATIVE
Glucose, UA: >=500 mg/dL — AB
Ketones, ur: 5 mg/dL — AB
Leukocytes,Ua: NEGATIVE
Nitrite: NEGATIVE
Protein, ur: >=300 mg/dL — AB
RBC / HPF: >50 RBC/hpf — ABNORMAL HIGH (ref 0–5)
Specific Gravity, Urine: 1.014 (ref 1.005–1.030)
pH: 6 (ref 5.0–8.0)

## 2021-01-04 LAB — COMPREHENSIVE METABOLIC PANEL
ALT: 15 U/L (ref 0–44)
AST: 17 U/L (ref 15–41)
Albumin: 2.5 g/dL — ABNORMAL LOW (ref 3.5–5.0)
Alkaline Phosphatase: 111 U/L (ref 38–126)
Anion gap: 10 (ref 5–15)
BUN: 22 mg/dL — ABNORMAL HIGH (ref 6–20)
CO2: 20 mmol/L — ABNORMAL LOW (ref 22–32)
Calcium: 8.5 mg/dL — ABNORMAL LOW (ref 8.9–10.3)
Chloride: 106 mmol/L (ref 98–111)
Creatinine, Ser: 1.81 mg/dL — ABNORMAL HIGH (ref 0.44–1.00)
GFR, Estimated: 38 mL/min — ABNORMAL LOW (ref >60)
Glucose, Bld: 211 mg/dL — ABNORMAL HIGH (ref 70–99)
Potassium: 3.3 mmol/L — ABNORMAL LOW (ref 3.5–5.1)
Sodium: 136 mmol/L (ref 135–145)
Total Bilirubin: 0.6 mg/dL (ref 0.3–1.2)
Total Protein: 7.1 g/dL (ref 6.5–8.1)

## 2021-01-04 LAB — CBG MONITORING, ED
Glucose-Capillary: 183 mg/dL — ABNORMAL HIGH (ref 70–99)
Glucose-Capillary: 111 mg/dL — ABNORMAL HIGH (ref 70–99)

## 2021-01-04 LAB — I-STAT BETA HCG BLOOD, ED (MC, WL, AP ONLY): I-stat hCG, quantitative: <5 m[IU]/mL (ref <5)

## 2021-01-04 LAB — LIPASE, BLOOD: Lipase: 23 U/L (ref 11–51)

## 2021-01-04 MED ORDER — SODIUM CHLORIDE 0.9 % IV SOLN: INTRAVENOUS | Status: DC

## 2021-01-04 MED ORDER — SODIUM CHLORIDE 0.9 % IV BOLUS
1000.0000 mL | Freq: Once | INTRAVENOUS | Status: AC
  Administered 2021-01-04: 1000 mL via INTRAVENOUS

## 2021-01-04 MED ORDER — HYDRALAZINE HCL 20 MG/ML IJ SOLN
10.0000 mg | Freq: Once | INTRAMUSCULAR | Status: AC
  Administered 2021-01-04: 10 mg via INTRAVENOUS
  Filled 2021-01-04: qty 1

## 2021-01-04 MED ORDER — HYDROMORPHONE HCL 1 MG/ML IJ SOLN
1.0000 mg | Freq: Once | INTRAMUSCULAR | Status: AC
  Administered 2021-01-04: 1 mg via INTRAVENOUS
  Filled 2021-01-04: qty 1

## 2021-01-04 MED ORDER — ONDANSETRON HCL 4 MG/2ML IJ SOLN
4.0000 mg | Freq: Once | INTRAMUSCULAR | Status: AC
  Administered 2021-01-04: 4 mg via INTRAVENOUS
  Filled 2021-01-04: qty 2

## 2021-01-04 MED ORDER — OXYCODONE-ACETAMINOPHEN 10-325 MG PO TABS: 1.0000 | ORAL_TABLET | Freq: Three times a day (TID) | ORAL | 0 refills | Status: AC | PRN

## 2021-01-04 MED ORDER — SODIUM CHLORIDE 0.9 % IV SOLN
25.0000 mg | Freq: Four times a day (QID) | INTRAVENOUS | Status: DC | PRN
Start: 2021-01-04 — End: 2021-01-04
  Administered 2021-01-04: 25 mg via INTRAVENOUS
  Filled 2021-01-04: qty 25

## 2021-01-04 MED ORDER — AMLODIPINE BESYLATE 5 MG PO TABS
10.0000 mg | ORAL_TABLET | Freq: Once | ORAL | Status: AC
  Administered 2021-01-04: 10 mg via ORAL
  Filled 2021-01-04: qty 2

## 2021-01-04 MED ORDER — INSULIN ASPART 100 UNIT/ML IJ SOLN
0.0000 [IU] | INTRAMUSCULAR | Status: DC
  Administered 2021-01-04: 2 [IU] via SUBCUTANEOUS
  Filled 2021-01-04: qty 0.09

## 2021-01-04 NOTE — Progress Notes (Addendum)
Inpatient Diabetes Program Recommendations  AACE/ADA: New Consensus Statement on Inpatient Glycemic Control (2015)  Target Ranges:  Prepandial:   less than 140 mg/dL      Peak postprandial:   less than 180 mg/dL (1-2 hours)      Critically ill patients:  140 - 180 mg/dL   Lab Results  Component Value Date   GLUCAP 222 (H) 01/02/2021   HGBA1C 7.2 (H) 11/13/2020    Review of Glycemic Control  Diabetes history: DM1 (makes NO insulin; requires basal, correction, and carbohydrate coverage insulin Outpatient Diabetes medications: Lantus 5 units daily, Humalog 0-5 units TID with meals Current orders for Inpatient glycemic control:  Being evaluated in ED  A1c 7.2% on 8/16  Inpatient Diabetes Program Recommendations:    In ED: -  Consider Novolog 0-9 units Q4 hours starting at 151.  If admitted consider starting [place] 5 units.  1024 am ordered Novolog 0-9 units Q4 per Dr. Vanita Panda, for cosign.  Thanks,  Tama Headings RN, MSN, BC-ADM Inpatient Diabetes Coordinator Team Pager [phone] (8a-5p)

## 2021-01-04 NOTE — Telephone Encounter (Signed)
Pt called again about medication. Please Advise.

## 2021-01-04 NOTE — ED Provider Notes (Signed)
[facility] DEPT Provider Note   CSN: [id number] Arrival date & time: 01/04/21  0254     History Chief Complaint  Patient presents with   Chest Pain   Nausea    [name] is a 30 y.o. female.  HPI Patient with multiple medical issues including gastroparesis, fibromyalgia, chronic pain presents with chest pain, back pain, nausea, weakness.  She notes that she has had not had access to her narcotic medication for 4 days, has been unable to reach her physician.  During that time.  She has had worsening severe back and chest pain as well as ongoing nausea, difficulty with p.o. tolerance.  She has possibly been able to take some of her antihypertensives, but has had no relief with taking Tylenol as a substitute for her narcotic medication.  She was seen and evaluated at our affiliated facility 2 nights ago, and attempted to be seen again yesterday, but left prior to evaluation. No fever greater than 100 degrees, no confusion, no syncope.  Dyspnea secondary to pain.    Past Medical History:  Diagnosis Date   Allergy    Anemia    Anxiety    Blood transfusion without reported diagnosis    Dec 2018   Cataract    right eye   COVID-19 03/2020   Depression    Diabetes type 1, uncontrolled 11/14/2011   Since age 87   Fibromyalgia    Gastroparesis    GERD (gastroesophageal reflux disease)    Hypertension    Infection    UTI April 2016    Patient Active Problem List   Diagnosis Date Noted   Non-pressure chronic ulcer of other part of left foot limited to breakdown of skin ([place])    PVD (peripheral vascular disease) ([place])    Hx of BKA, right ([place])    Venous stasis of both lower extremities    Chronic diastolic CHF (congestive heart failure) ([place]) 12/13/2020   Seizure ([place])    PRES (posterior reversible encephalopathy syndrome)    Type 1 diabetes mellitus with other specified complication ([place]) 27/08/2374   Insomnia 11/15/2020   Iron deficiency  04/11/2020   Osteomyelitis ([place]) 04/10/2020   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 ([place]) 02/03/2020   Cannabinoid hyperemesis syndrome 04/30/2019   Contraception management 08/06/2018   Diabetic gastroparesis associated with type 1 diabetes mellitus ([place]) 06/04/2018   Anxiety 05/31/2018   Peripheral edema 05/31/2018   Chronic anemia 07/10/2017   GERD (gastroesophageal reflux disease) 06/23/2017   MDD (major depressive disorder), recurrent episode, mild ([place]) 02/28/2017   Severe protein-calorie malnutrition ([place]) 04/28/2013   Hypertension associated with diabetes ([place]) 11/14/2011    Past Surgical History:  Procedure Laterality Date   AMPUTATION Right 04/13/2020   Procedure: 1ST RAY AMPUTATION RIGHT FOOT;  Surgeon: Newt Minion, MD;  Location: [facility];  Service: Orthopedics;  Laterality: Right;   AMPUTATION Right 06/06/2020   Procedure: RIGHT BELOW KNEE AMPUTATION;  Surgeon: Newt Minion, MD;  Location: [facility];  Service: Orthopedics;  Laterality: Right;   AMPUTATION Left    great toe   ANKLE SURGERY     APPLICATION OF WOUND VAC Right 06/06/2020   Procedure: APPLICATION OF WOUND VAC;  Surgeon: Newt Minion, MD;  Location: [facility];  Service: Orthopedics;  Laterality: Right;   CHOLECYSTECTOMY  11/15/2011   Procedure: LAPAROSCOPIC CHOLECYSTECTOMY WITH INTRAOPERATIVE CHOLANGIOGRAM;  Surgeon: [name], MD;  Location: [facility];  Service: General;  Laterality: N/A;  COLONOSCOPY     COLONOSCOPY WITH PROPOFOL N/A 06/27/2017   Procedure: COLONOSCOPY WITH PROPOFOL;  Surgeon: Milus Banister, MD;  [facility];  Service: Endoscopy;  Laterality: N/A;   ESOPHAGOGASTRODUODENOSCOPY  12/03/2011   Procedure: ESOPHAGOGASTRODUODENOSCOPY (EGD);  Surgeon: Beryle Beams, MD;  Location: [facility];  Service: Endoscopy;  Laterality: N/A;   FLEXIBLE SIGMOIDOSCOPY N/A 03/10/2017   Procedure: FLEXIBLE SIGMOIDOSCOPY;  Surgeon: Carol Ada, MD;  [facility];  Service:  Endoscopy;  Laterality: N/A;   INCISION AND DRAINAGE PERIRECTAL ABSCESS N/A 03/01/2017   Procedure: IRRIGATION AND DEBRIDEMENT PERIRECTAL ABSCESS;  Surgeon: Alphonsa Overall, MD;  [facility];  Service: General;  Laterality: N/A;   IRRIGATION AND DEBRIDEMENT BUTTOCKS N/A 03/23/2017   Procedure: IRRIGATION AND DEBRIDEMENT BUTTOCKS, SETON PLACEMENT;  Surgeon: [name], MD;  [facility];  Service: General;  Laterality: N/A;   LAPAROSCOPY  11/23/2011   Procedure: LAPAROSCOPY DIAGNOSTIC;  Surgeon: Edward Jolly, MD;  Location: [facility];  Service: General;  Laterality: N/A;   SIGMOIDOSCOPY     UPPER GASTROINTESTINAL ENDOSCOPY     WISDOM TOOTH EXTRACTION       OB History     Gravida  1   Para  1   Term      Preterm  1   AB      Living  1      SAB      IAB      Ectopic      Multiple  0   Live Births  1           Family History  Problem Relation Age of Onset   Diabetes Mother    Hypertension Father    Colon cancer Paternal Grandmother        pt thinks PGM was dx in her 51's   Diabetes Paternal Grandmother    Diabetes Maternal Grandmother    Diabetes Maternal Grandfather    Diabetes Paternal Grandfather    Diabetes Other    Breast cancer Paternal Aunt    Esophageal cancer Neg Hx    Liver cancer Neg Hx    Pancreatic cancer Neg Hx    Stomach cancer Neg Hx    Rectal cancer Neg Hx     Social History   Tobacco Use   Smoking status: Never   Smokeless tobacco: Never  Vaping Use   Vaping Use: Never used  Substance Use Topics   Alcohol use: No    Alcohol/week: 0.0 standard drinks   Drug use: Not Currently    Types: Marijuana    Home Medications Prior to Admission medications   Medication Sig Start Date End Date Taking? Authorizing Provider  acetaminophen (TYLENOL) 500 MG tablet Take 1,000 mg by mouth every 6 (six) hours as needed for moderate pain or headache.    [provider]  Albuterol Sulfate (PROAIR RESPICLICK) 494 (90 Base)  MCG/ACT AEPB Inhale 2 puffs into the lungs daily as needed. Patient taking differently: Inhale 2 puffs into the lungs daily as needed (wheezing). 11/08/20 02/02/21  Allwardt, Randa Evens, PA-C  amLODipine (NORVASC) 10 MG tablet Take 1 tablet (10 mg total) by mouth daily. 12/24/20   [name], MD  azithromycin [facility]) 250 MG tablet Take 2 tabs day 1, then 1 tab daily 01/01/21   [name], MD  blood glucose meter kit and supplies by Other route. Check blood sugar 3-4 times    [provider]  busPIRone (BUSPAR) 5 MG tablet  Take 1 tablet (5 mg total) by mouth 3 (three) times daily. 02/03/20   [name], MD  Carboxymethylcell-Hypromellose (GENTEAL) 0.25-0.3 % GEL Place 1 drop into both eyes daily as needed (dryeyes).    [provider]  carvedilol (COREG) 25 MG tablet Take 1 tablet (25 mg total) by mouth 2 (two) times daily with a meal. 12/26/20 12/21/21  [name], MD  Continuous Blood Gluc Sensor (DEXCOM G6 SENSOR) MISC USE AS DIRECTED EVERY 10 DAYS 07/28/20   Renato Shin, MD  Continuous Blood Gluc Transmit (DEXCOM G6 TRANSMITTER) MISC USE AS DIRECTED TO  MONITOR  GLUCOSE  LEVELS 05/10/20   Renato Shin, MD  cyclobenzaprine (FLEXERIL) 10 MG tablet Take 1 tablet (10 mg total) by mouth 3 (three) times daily as needed for muscle spasms. 11/15/20   [name], MD  dicyclomine (BENTYL) 20 MG tablet Take 1 tablet (20 mg total) by mouth every 8 (eight) hours as needed for spasms. 11/25/19   Petrucelli, Samantha R, PA-C  EPINEPHrine 0.3 mg/0.3 mL IJ SOAJ injection Inject 0.3 mg into the muscle once as needed (anaphylaxis/allergic reaction). Patient taking differently: Inject 0.3 mg into the muscle once as needed for anaphylaxis. 07/16/20   [name], MD  ferrous sulfate 325 (65 FE) MG tablet Take 1 tablet (325 mg total) by mouth 2 (two) times daily with a meal. 12/16/20   Thurnell Lose, MD  folic acid (FOLVITE) 1 MG tablet Take 1 tablet (1 mg total) by mouth  daily. 12/16/20   Thurnell Lose, MD  gabapentin (NEURONTIN) 100 MG capsule Take 200 mg by mouth 3 (three) times daily. 11/02/20   [provider]  hydrALAZINE (APRESOLINE) 50 MG tablet Take 50 mg by mouth 4 (four) times daily. 09/14/20   [provider]  insulin glargine (LANTUS) 100 UNIT/ML injection Inject 0.05 mLs (5 Units total) into the skin daily. 12/10/20   Raiford Noble Latif, DO  insulin lispro (HUMALOG) 100 UNIT/ML injection Inject 0-5 Units into the skin 3 (three) times daily before meals. Sliding scale    [provider]  Insulin Pen Needle 32G X 4 MM MISC USE AS DIRECTED WITH INSULIN PENS 05/24/20 05/24/21  Mercy Riding, MD  Ipratropium-Albuterol (COMBIVENT) 20-100 MCG/ACT AERS respimat Inhale 1 puff into the lungs every 6 (six) hours as needed for wheezing or shortness of breath. 12/10/20   Raiford Noble Latif, DO  levETIRAcetam (KEPPRA) 500 MG tablet Take 1 tablet (500 mg total) by mouth 2 (two) times daily. 12/10/20   Raiford Noble Latif, DO  loperamide (IMODIUM) 2 MG capsule Take 6 mg by mouth daily as needed for diarrhea or loose stools.     [provider]  LORazepam (ATIVAN) 0.5 MG tablet Take 1 tablet (0.5 mg total) by mouth 2 (two) times daily. 11/15/20   [name], MD  metoCLOPramide (REGLAN) 5 MG tablet Take 1 tablet (5 mg total) by mouth 3 (three) times daily before meals. Please make and keep an appointment for further refills. 11/13/20 02/11/21  [place] Eric J, DO  Nutritional Supplements (,FEEDING SUPPLEMENT, PROSOURCE PLUS) liquid Take 30 mLs by mouth 2 (two) times daily between meals. 12/10/20   Raiford Noble Latif, DO  ondansetron (ZOFRAN ODT) 4 MG disintegrating tablet Take 1 tablet (4 mg total) by mouth every 8 (eight) hours as needed for nausea or vomiting. 11/08/20   Allwardt, Randa Evens, PA-C  oxyCODONE-acetaminophen (PERCOCET) 10-325 MG tablet Take 1 tablet by mouth every 8 (eight)  hours as needed for pain. 01/04/21   [name],  MD  pantoprazole (PROTONIX) 40 MG tablet Take 1 tablet (40 mg total) by mouth daily. 12/16/20   Thurnell Lose, MD  promethazine (PHENERGAN) 12.5 MG tablet Take 1 tablet (12.5 mg total) by mouth every 8 (eight) hours as needed for nausea or vomiting. 12/26/20   [name], MD  traZODone (DESYREL) 50 MG tablet Take 0.5-1 tablets (25-50 mg total) by mouth at bedtime as needed for sleep. 11/15/20   [name], MD  vitamin B-12 1000 MCG tablet Take 1 tablet (1,000 mcg total) by mouth daily. 12/16/20   Thurnell Lose, MD  zinc sulfate 220 (50 Zn) MG capsule Take 1 capsule (220 mg total) by mouth daily. 12/10/20   Raiford Noble Latif, DO  potassium chloride SA (KLOR-CON) 20 MEQ tablet Take 1 tablet (20 mEq total) by mouth daily for 5 doses. 07/04/19 07/30/19  Janeece Fitting, PA-C    Allergies    Other and Lactose intolerance (gi)  Review of Systems   Review of Systems  Constitutional:        Per HPI, otherwise negative  HENT:         Per HPI, otherwise negative  Respiratory:         Per HPI, otherwise negative  Cardiovascular:        Per HPI, otherwise negative  Gastrointestinal:  Positive for nausea and vomiting.  Endocrine:       Negative aside from HPI  Genitourinary:        Neg aside from HPI   Musculoskeletal:        Per HPI, otherwise negative  Skin: Negative.   Neurological:  Positive for weakness. Negative for syncope.   Physical Exam Updated Vital Signs BP (!) 215/119   Pulse 85   Temp 98.9 F (37.2 C) (Oral)   Resp 17   SpO2 99%   Physical Exam Vitals and nursing note reviewed.  Constitutional:      General: She is not in acute distress.    Appearance: She is well-developed.     Comments: Uncomfortable appearing adult female awake and alert sitting upright  HENT:     Head: Normocephalic and atraumatic.  Eyes:     Conjunctiva/sclera: Conjunctivae normal.  Cardiovascular:     Rate and Rhythm: Normal rate and regular rhythm.  Pulmonary:     Effort:  Pulmonary effort is normal. No respiratory distress.     Breath sounds: Normal breath sounds. No stridor.  Abdominal:     General: There is no distension.  Musculoskeletal:     Comments: Left foot with immobilization shoe and surgical wrap in place, patient notes that is healing well, has been recently evaluated, is not currently concerned.  Skin:    General: Skin is warm and dry.  Neurological:     Mental Status: She is alert and oriented to person, place, and time.     Cranial Nerves: No cranial nerve deficit.    ED Results / Procedures / Treatments   Labs (all labs ordered are listed, but only abnormal results are displayed) Labs Reviewed  COMPREHENSIVE METABOLIC PANEL - Abnormal; Notable for the following components:      Result Value   Potassium 3.3 (*)    CO2 20 (*)    Glucose, Bld 211 (*)    BUN 22 (*)    Creatinine, Ser 1.81 (*)    Calcium 8.5 (*)    Albumin 2.5 (*)  GFR, Estimated 38 (*)    All other components within normal limits  CBC WITH DIFFERENTIAL/PLATELET - Abnormal; Notable for the following components:   RBC 3.23 (*)    Hemoglobin 9.1 (*)    HCT 28.0 (*)    All other components within normal limits  URINALYSIS, ROUTINE W REFLEX MICROSCOPIC - Abnormal; Notable for the following components:   Glucose, UA >=500 (*)    Hgb urine dipstick MODERATE (*)    Ketones, ur 5 (*)    Protein, ur >=300 (*)    RBC / HPF >50 (*)    All other components within normal limits  CBG MONITORING, ED - Abnormal; Notable for the following components:   Glucose-Capillary 183 (*)    All other components within normal limits  CBG MONITORING, ED - Abnormal; Notable for the following components:   Glucose-Capillary 111 (*)    All other components within normal limits  LIPASE, BLOOD  I-STAT BETA HCG BLOOD, ED ([facility], [facility], [facility] ONLY)    EKG Sinus 96, anterior injury pattern, wander, artifact, abnormal   Radiology DG Chest 2 View  Result Date: 01/03/2021 CLINICAL DATA:   Shortness of breath with nausea and vomiting. EXAM: CHEST - 2 VIEW COMPARISON:  December 26, 2020 FINDINGS: The heart size and mediastinal contours are within normal limits. Both lungs are clear. Radiopaque surgical clips are seen overlying the right upper quadrant the visualized skeletal structures are unremarkable. IMPRESSION: No active cardiopulmonary disease. Electronically Signed   By: Virgina Norfolk M.D.   On: 01/03/2021 19:55   DG Foot Complete Left  Result Date: 01/02/2021 CLINICAL DATA:  Foot pain EXAM: LEFT FOOT - COMPLETE 3+ VIEW COMPARISON:  11/03/2020 FINDINGS: Changes consistent with first digit amputation are again noted and stable. Soft tissue emphysema is noted adjacent to the head of the fifth metatarsal consistent with soft tissue wound. Generalized soft tissue swelling is noted in this region as well. No definitive osteomyelitis is noted. 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 IMPRESSION: Soft tissue emphysema adjacent to the fifth metatarsal consistent with focal wound. No bony erosive changes to suggest osteomyelitis are noted. Postoperative changes consistent with the given clinical history. Electronically Signed   By: Inez Catalina M.D.   On: 01/02/2021 23:24    Procedures Procedures   Medications Ordered in ED Medications  sodium chloride 0.9 % bolus 1,000 mL (0 mLs Intravenous Stopped 01/04/21 1559)    And  0.9 %  sodium chloride infusion ( Intravenous New Bag/Given 01/04/21 1530)  promethazine (PHENERGAN) 25 mg in sodium chloride 0.9 % 50 mL IVPB (0 mg Intravenous Stopped 01/04/21 1559)  insulin aspart (novoLOG) injection 0-9 Units (2 Units Subcutaneous Given 01/04/21 1414)  amLODipine (NORVASC) tablet 10 mg (has no administration in time range)  ondansetron (ZOFRAN) injection 4 mg (4 mg Intravenous Given 01/04/21 1404)  HYDROmorphone (DILAUDID) injection 1 mg (1 mg Intravenous Given 01/04/21  1404)  hydrALAZINE (APRESOLINE) injection 10 mg (10 mg Intravenous Given 01/04/21 1601)    ED Course  I have reviewed the triage vital signs and the nursing notes.  Pertinent labs & imaging results that were available during my care of the patient were reviewed by me and considered in my medical decision making (see chart for details).  Chart review notable for 11 prior ED visits in the past 6 months including 2 within the past week.  4:02 PM Patient markedly better, awake, alert, no distress,  no increased work of breathing, speaking clearly.  Labs consistent with multiple prior studies, and given her substantial improvement here, patient likely appropriate for discharge.  To this point the patient has had decreased ability to tolerate home meds, will receive amlodipine, hydralazine, and be discharged to follow-up with primary care.  No evidence for new endorgan damage, no distress, and now she has tolerance of oral intake.    Final Clinical Impression(s) / ED Diagnoses Final diagnoses:  Hypertensive urgency  Nausea     Carmin Muskrat, MD 01/04/21 [phone]

## 2021-01-04 NOTE — ED Notes (Signed)
Assumed care of pt at 1350.  Pt to room 12.  IV established pt medicated per orders.

## 2021-01-04 NOTE — Discharge Instructions (Addendum)
With your history of difficulty to control hypertension and gastroparesis that is very poorly follow-up with your primary care physician soon as possible.  Return here for concerning changes in your condition.

## 2021-01-04 NOTE — ED Notes (Signed)
Patient advised not to leave, LWBS.

## 2021-01-04 NOTE — ED Triage Notes (Signed)
Pt arrived via POV, c/o chest pain, nausea, epigastric pain.

## 2021-01-06 ENCOUNTER — Other Ambulatory Visit: Payer: Self-pay | Admitting: Family Medicine

## 2021-01-07 ENCOUNTER — Telehealth: Payer: Self-pay

## 2021-01-07 ENCOUNTER — Ambulatory Visit: Payer: Medicaid Other

## 2021-01-07 DIAGNOSIS — R609 Edema, unspecified: Secondary | ICD-10-CM | POA: Diagnosis not present

## 2021-01-07 DIAGNOSIS — R000 Tachycardia, unspecified: Secondary | ICD-10-CM | POA: Diagnosis not present

## 2021-01-07 DIAGNOSIS — R0689 Other abnormalities of breathing: Secondary | ICD-10-CM | POA: Diagnosis not present

## 2021-01-07 DIAGNOSIS — I499 Cardiac arrhythmia, unspecified: Secondary | ICD-10-CM | POA: Diagnosis not present

## 2021-01-07 DIAGNOSIS — I469 Cardiac arrest, cause unspecified: Secondary | ICD-10-CM | POA: Diagnosis not present

## 2021-01-07 NOTE — Telephone Encounter (Signed)
Transition Care Management Unsuccessful Follow-up Telephone Call  Date of discharge and from where:  01/04/2021 from [facility] ED  Attempts:  1st Attempt  Reason for unsuccessful TCM follow-up call:  Left voice message

## 2021-01-08 NOTE — Telephone Encounter (Signed)
Transition Care Management Unsuccessful Follow-up Telephone Call  Date of discharge and from where:  01/04/2021 from [facility] ED  Attempts:  2nd Attempt  Reason for unsuccessful TCM follow-up call:  Left voice message

## 2021-01-09 ENCOUNTER — Ambulatory Visit: Payer: Medicaid Other

## 2021-01-09 NOTE — Telephone Encounter (Signed)
Transition Care Management Unsuccessful Follow-up Telephone Call  Date of discharge and from where:  01/04/2021 from [facility] ED  Attempts:  3rd Attempt  Reason for unsuccessful TCM follow-up call:  Unable to reach patient   Patient has been marked Deceased. No DOD entered at this time.

## 2021-01-22 ENCOUNTER — Ambulatory Visit: Payer: Medicaid Other | Admitting: Gastroenterology

## 2021-03-01 ENCOUNTER — Ambulatory Visit: Payer: 59 | Admitting: Diagnostic Neuroimaging

## 2021-09-04 NOTE — Patient Outreach (Signed)
Care Coordination  09/29/1585  [name] 2/76/1849 [id number]  This RN Care Manager with the [place] Medicaid team opened this encounter to review the chart and complete the Case Closure.  Salvatore Marvel RN, BSN Community Care Coordinator [place] [facility] Mobile: [phone]

## 2021-09-04 NOTE — Patient Instructions (Addendum)
   09/29/5951   Merari Pion Sitton [birth year] [id number]   This RN Care Manager with the [place] Medicaid team opened this encounter to review the chart and complete the Case Closure.   Salvatore Marvel RN, BSN Community Care Coordinator [place] [facility] Mobile: [phone]
